# Patient Record
Sex: Male | Born: 1945 | Race: Black or African American | Hispanic: No | Marital: Married
Health system: Southern US, Community
[De-identification: ages and names within clinical notes are randomized; demographics above are authoritative.]

## PROBLEM LIST (undated history)

## (undated) DIAGNOSIS — I4892 Unspecified atrial flutter: Secondary | ICD-10-CM

## (undated) DIAGNOSIS — I272 Pulmonary hypertension, unspecified: Secondary | ICD-10-CM

## (undated) DIAGNOSIS — G4730 Sleep apnea, unspecified: Secondary | ICD-10-CM

## (undated) DIAGNOSIS — I2510 Atherosclerotic heart disease of native coronary artery without angina pectoris: Secondary | ICD-10-CM

## (undated) DIAGNOSIS — J449 Chronic obstructive pulmonary disease, unspecified: Secondary | ICD-10-CM

## (undated) DIAGNOSIS — E7800 Pure hypercholesterolemia, unspecified: Secondary | ICD-10-CM

## (undated) DIAGNOSIS — E119 Type 2 diabetes mellitus without complications: Secondary | ICD-10-CM

## (undated) DIAGNOSIS — I10 Essential (primary) hypertension: Secondary | ICD-10-CM

## (undated) DIAGNOSIS — Z87891 Personal history of nicotine dependence: Secondary | ICD-10-CM

## (undated) DIAGNOSIS — G8929 Other chronic pain: Secondary | ICD-10-CM

## (undated) DIAGNOSIS — I428 Other cardiomyopathies: Secondary | ICD-10-CM

## (undated) DIAGNOSIS — I5022 Chronic systolic (congestive) heart failure: Secondary | ICD-10-CM

## (undated) DIAGNOSIS — Z720 Tobacco use: Secondary | ICD-10-CM

## (undated) DIAGNOSIS — I480 Paroxysmal atrial fibrillation: Secondary | ICD-10-CM

## (undated) HISTORY — DX: Tobacco use: Z72.0

## (undated) HISTORY — DX: Chronic systolic (congestive) heart failure: I50.22

## (undated) HISTORY — DX: Other cardiomyopathies: I42.8

## (undated) HISTORY — DX: Unspecified atrial flutter: I48.92

## (undated) HISTORY — DX: Atherosclerotic heart disease of native coronary artery without angina pectoris: I25.10

## (undated) HISTORY — DX: Paroxysmal atrial fibrillation: I48.0

## (undated) HISTORY — DX: Chronic obstructive pulmonary disease, unspecified: J44.9

## (undated) HISTORY — DX: Pulmonary hypertension, unspecified: I27.20

## (undated) HISTORY — PX: SPLENECTOMY: SUR1306

## (undated) HISTORY — DX: Other chronic pain: G89.29

---
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
## 2002-04-10 ENCOUNTER — Encounter: Payer: Self-pay | Admitting: Emergency Medicine

## 2002-04-10 ENCOUNTER — Emergency Department (HOSPITAL_COMMUNITY): Admission: EM | Admit: 2002-04-10 | Discharge: 2002-04-10 | Payer: Self-pay | Admitting: Emergency Medicine

## 2002-12-02 ENCOUNTER — Encounter: Payer: Self-pay | Admitting: Physical Medicine and Rehabilitation

## 2002-12-02 ENCOUNTER — Encounter
Admission: RE | Admit: 2002-12-02 | Discharge: 2002-12-02 | Payer: Self-pay | Admitting: Physical Medicine and Rehabilitation

## 2003-06-09 ENCOUNTER — Emergency Department (HOSPITAL_COMMUNITY): Admission: EM | Admit: 2003-06-09 | Discharge: 2003-06-09 | Payer: Self-pay | Admitting: *Deleted

## 2003-06-09 ENCOUNTER — Encounter: Payer: Self-pay | Admitting: *Deleted

## 2003-10-04 ENCOUNTER — Inpatient Hospital Stay (HOSPITAL_COMMUNITY): Admission: RE | Admit: 2003-10-04 | Discharge: 2003-10-09 | Payer: Self-pay | Admitting: Specialist

## 2004-07-24 ENCOUNTER — Emergency Department (HOSPITAL_COMMUNITY): Admission: EM | Admit: 2004-07-24 | Discharge: 2004-07-25 | Payer: Self-pay | Admitting: Emergency Medicine

## 2005-03-27 ENCOUNTER — Ambulatory Visit (HOSPITAL_COMMUNITY): Admission: RE | Admit: 2005-03-27 | Discharge: 2005-03-27 | Payer: Self-pay | Admitting: Pulmonary Disease

## 2005-12-27 IMAGING — CR DG CHEST 2V
2 series · 2 of 2 positions shown · non-contrast
Comparison: none

CLINICAL DATA: Cough.  Chest pain.  
TWO VIEW CHEST:
Focal airspace disease in the posterior basilar lower lung noted.  Mild peribronchial thickening.  Cardiomediastinal silhouette unremarkable.  Remainder of lungs are clear.  Thoracic spondylosis is present.
IMPRESSION 
Mild airspace disease in the posterior lower lung, recommend follow-up to resolution.
Mild peribronchial thickening.

[view not recorded (1 of 2)]
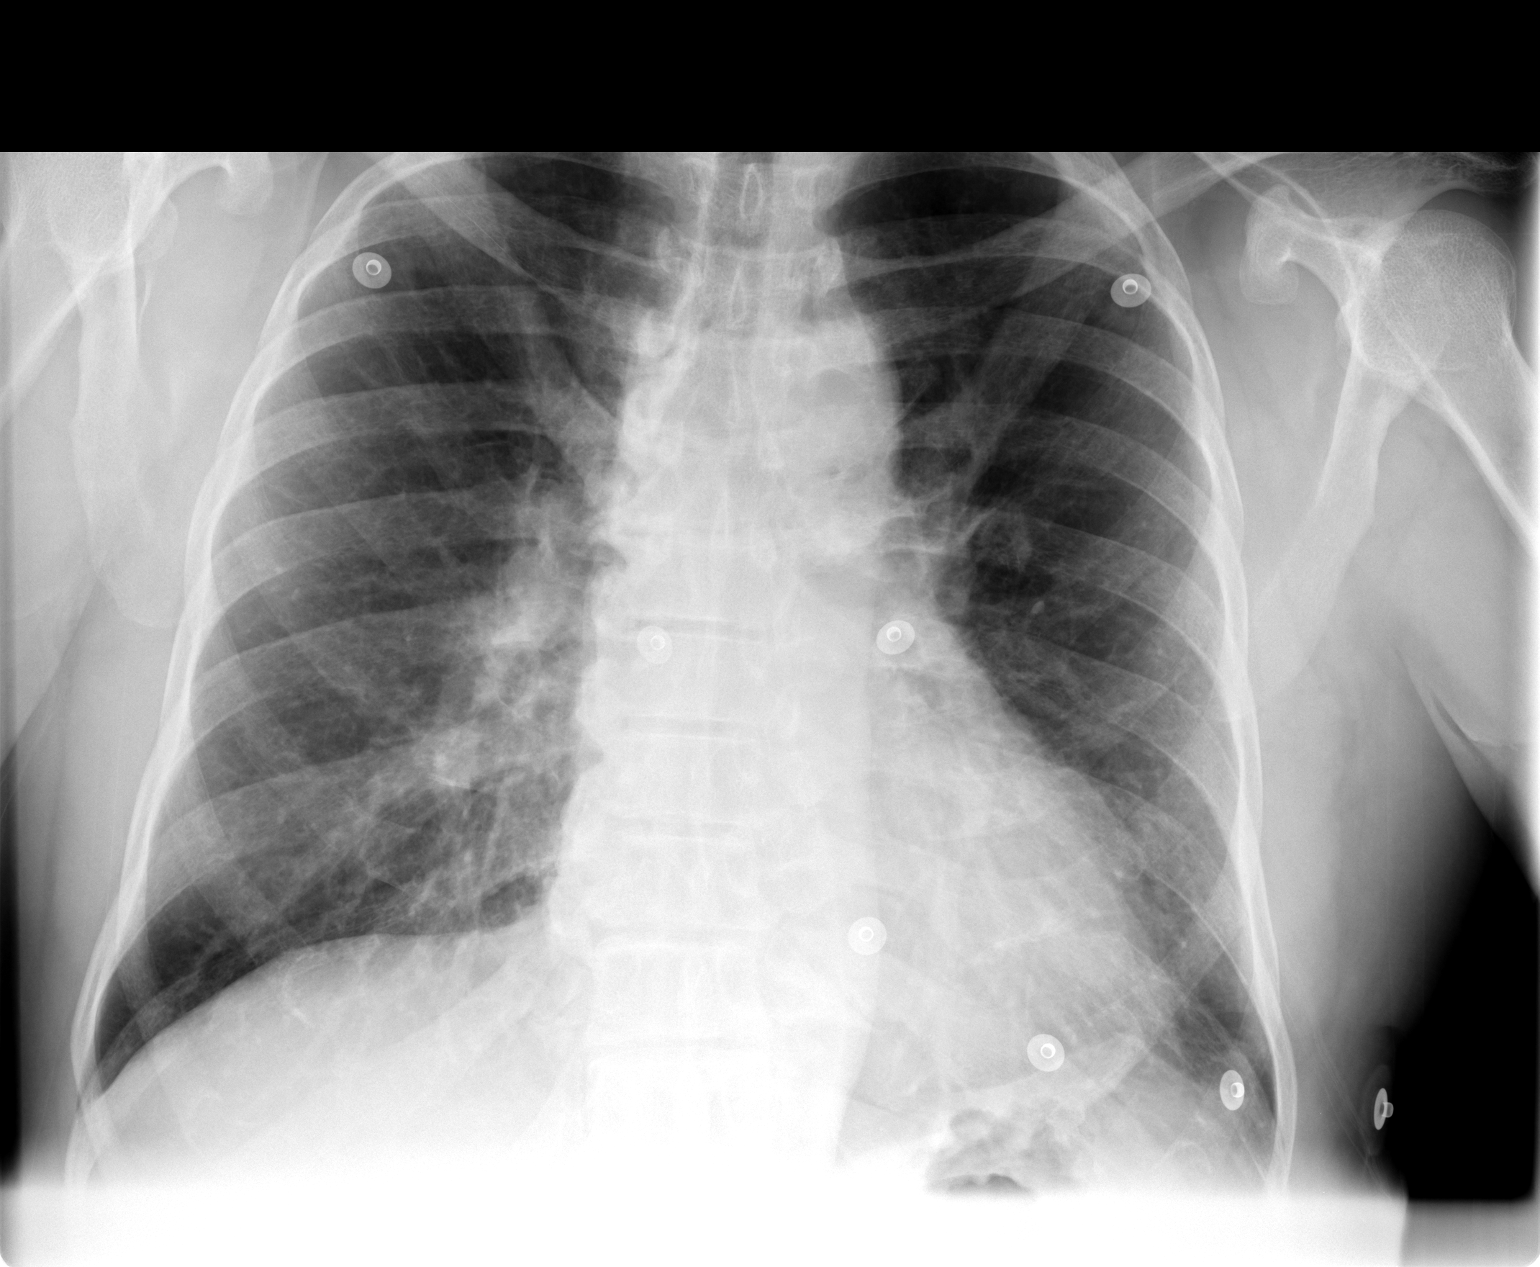

[view not recorded (2 of 2)]
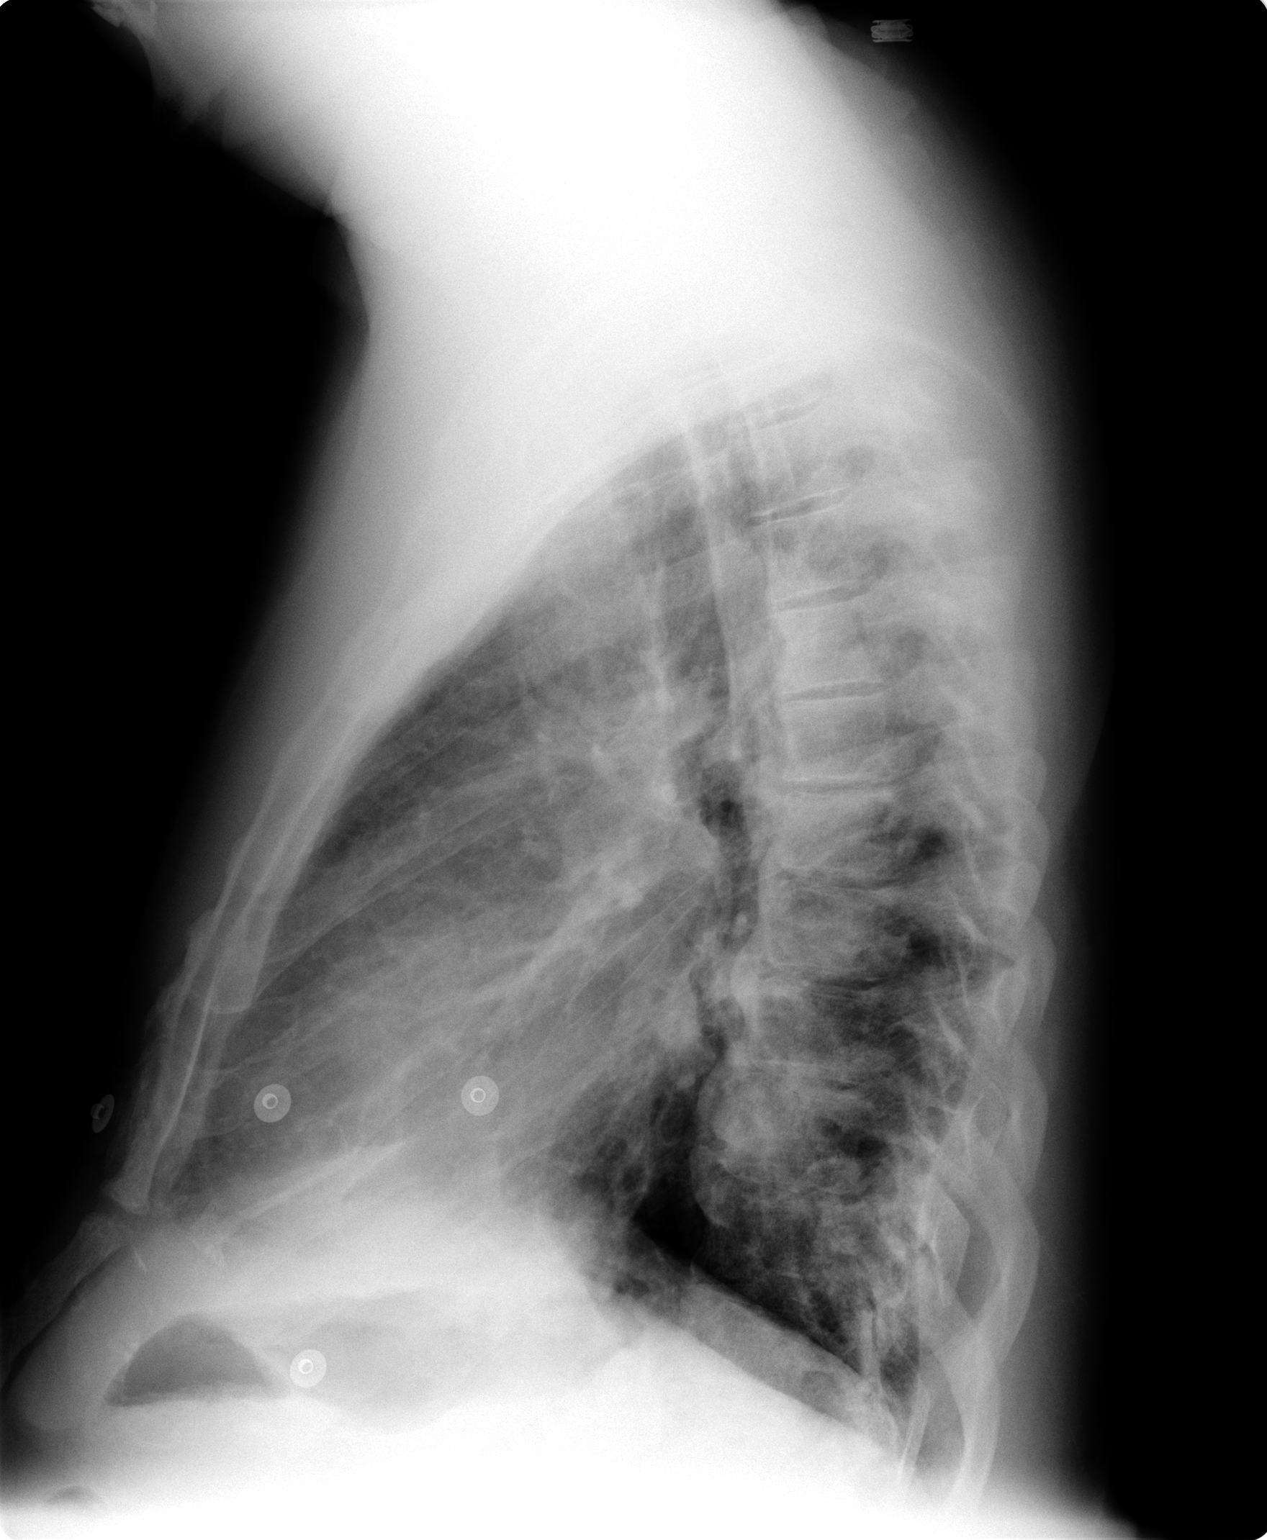

[2 of 2 positions shown; findings below may reference images not displayed]

## 2006-08-30 IMAGING — CR DG CHEST 2V
2 series · 2 of 2 positions shown · non-contrast
Comparison: none

CLINICAL DATA: Fever.  Cough.
 2-VIEWS OF THE CHEST ? 03/27/2005 ? ([DATE] HOURS):

[w chest pa]
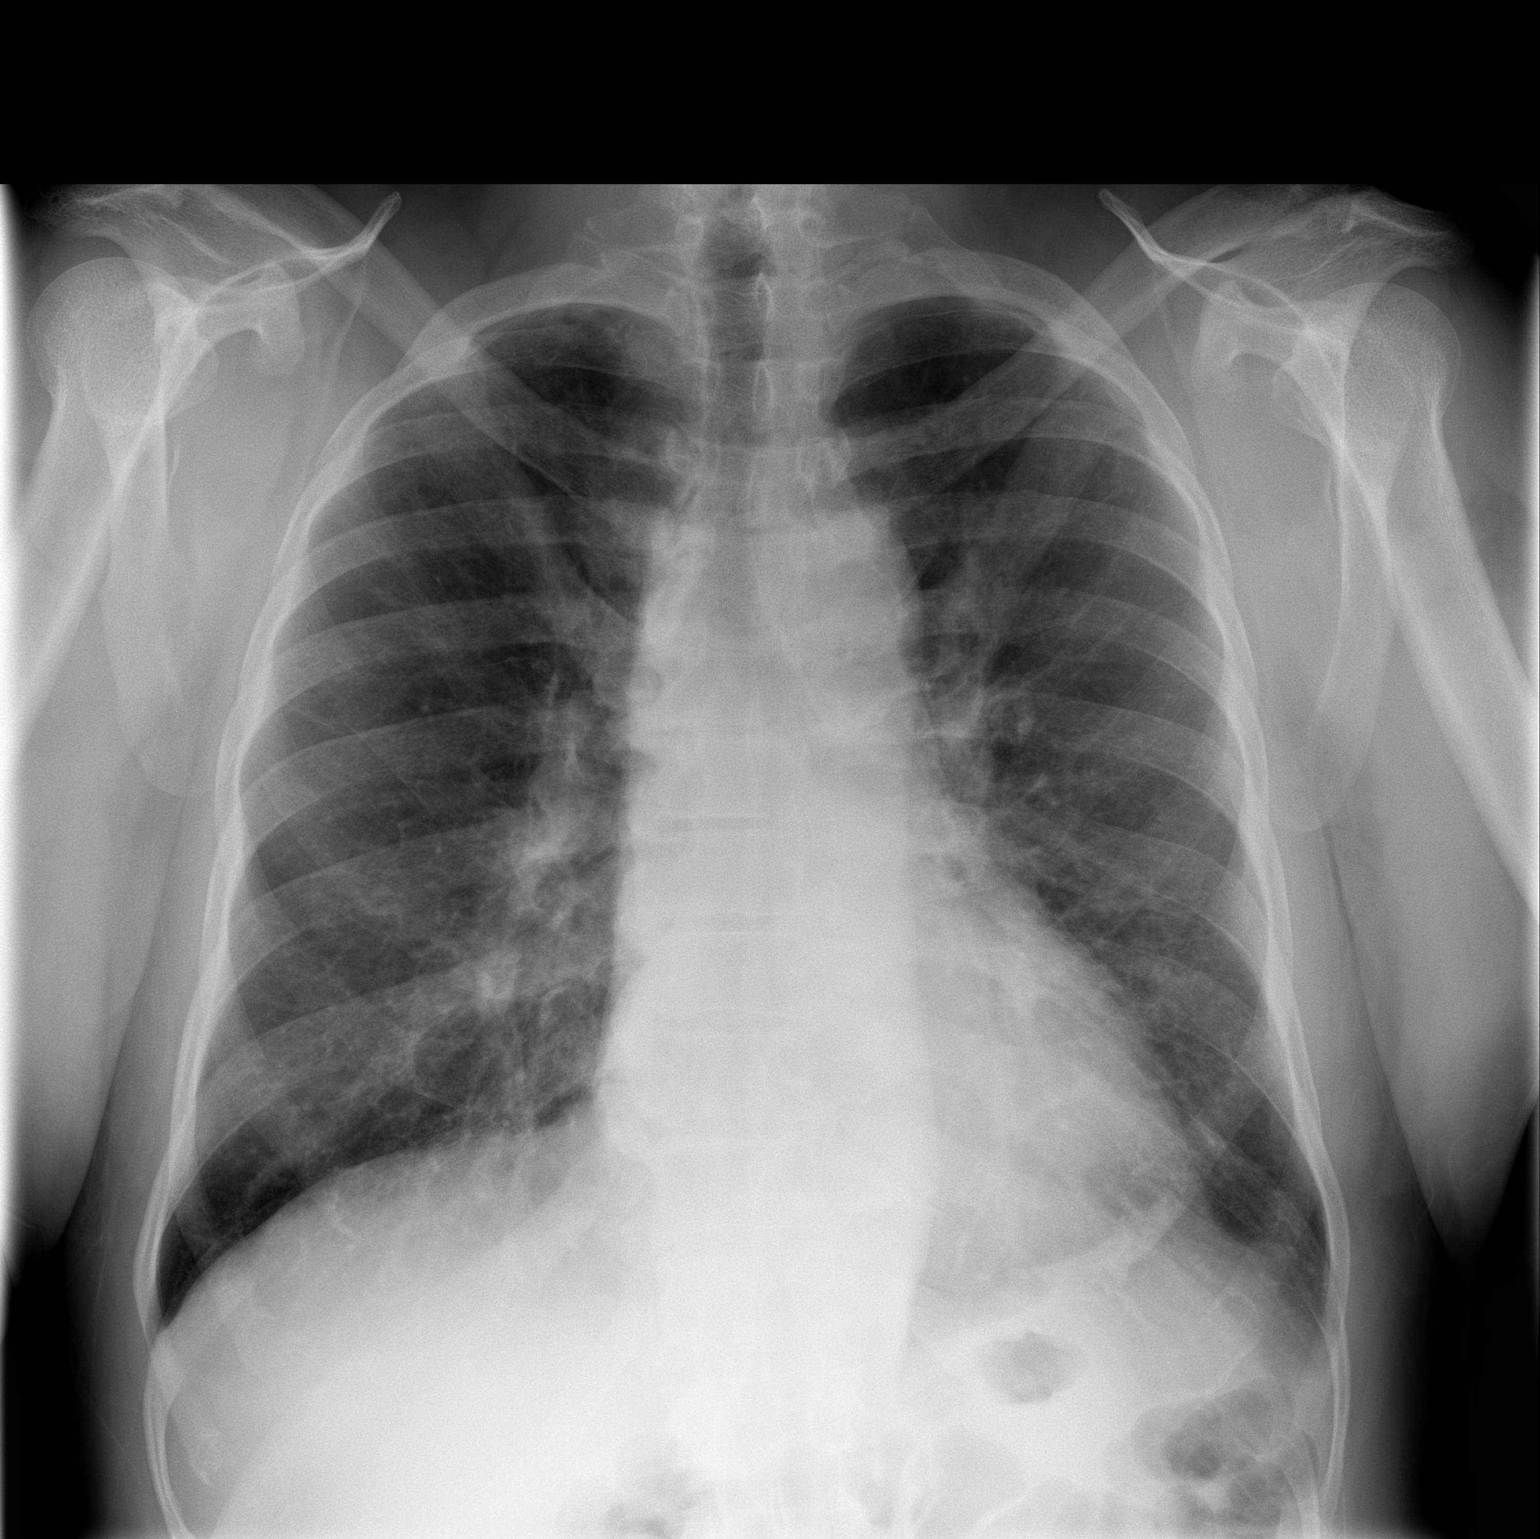

[w chest lat]
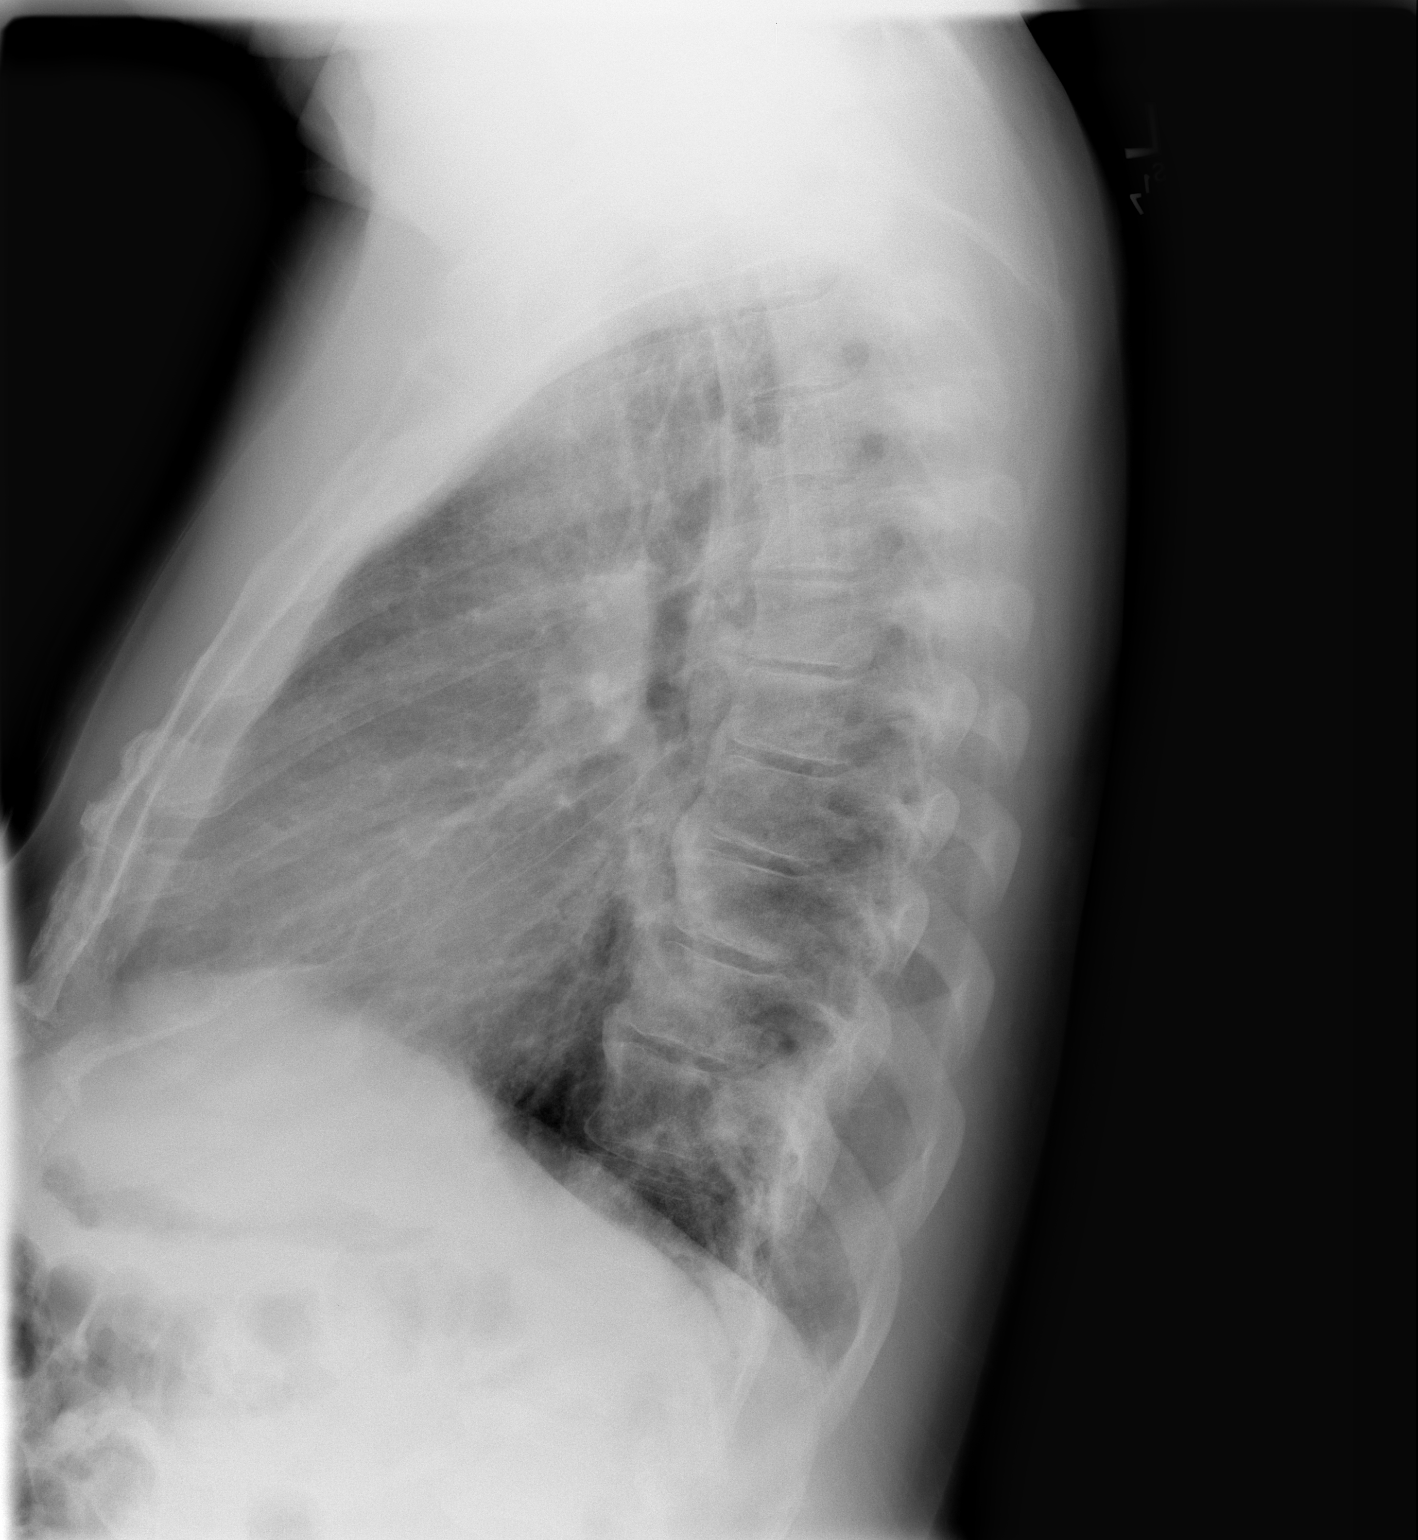

[2 of 2 positions shown; findings below may reference images not displayed]

FINDINGS: Streaky bibasilar infiltrates are present.  The upper lung zones are clear.  No pneumothoraces or effusions are seen.  The heart is normal in size.
IMPRESSION: Bibasilar streaky infiltrates.  Consider interstitial pneumonia.

## 2008-12-07 ENCOUNTER — Ambulatory Visit (HOSPITAL_COMMUNITY): Admission: RE | Admit: 2008-12-07 | Discharge: 2008-12-07 | Payer: Self-pay | Admitting: Pulmonary Disease

## 2010-05-12 IMAGING — US US ABDOMEN COMPLETE
1 series · 2 of 2 positions shown · non-contrast
Comparison: None

CLINICAL DATA: Abnormal LFTs.

ABDOMEN ULTRASOUND
TECHNIQUE: Complete abdominal ultrasound examination was performed
including evaluation of the liver, gallbladder, bile ducts,
pancreas, kidneys, spleen, IVC, and abdominal aorta.

[Series 1: unknown · 0.26mm/px · 2 of 2 slices shown]
[im 1/2]
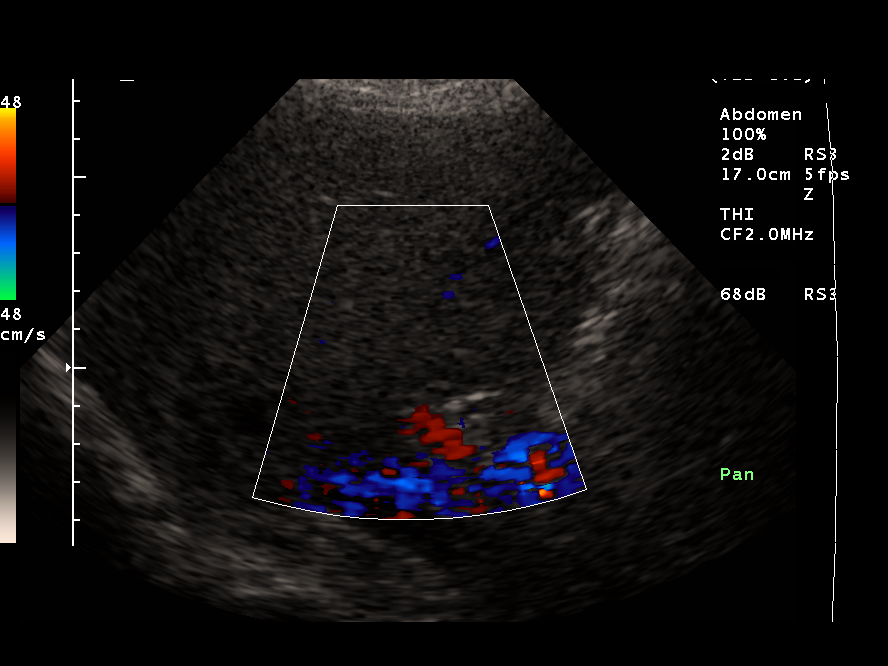
[im 2/2]
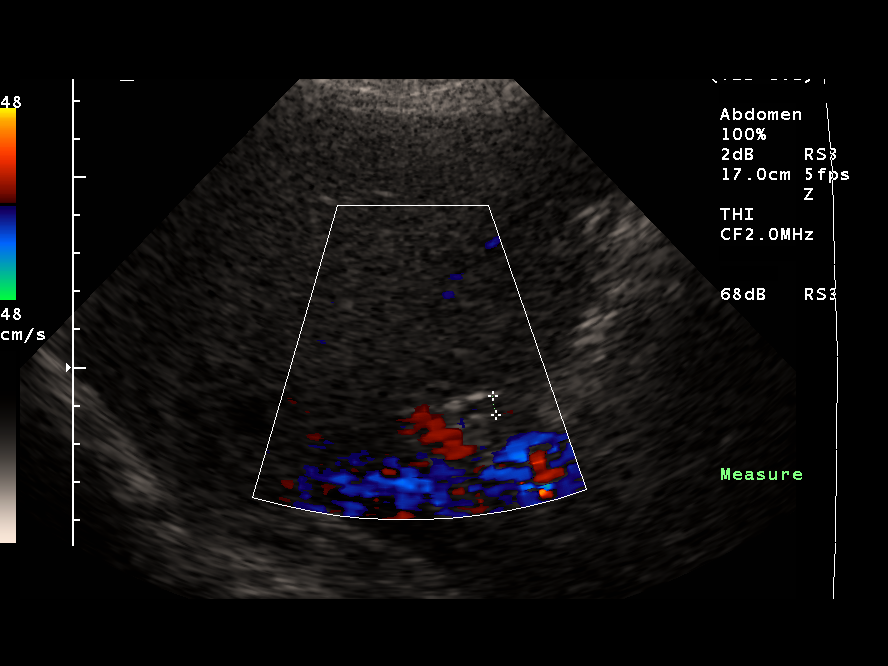

[2 of 2 positions shown; findings below may reference images not displayed]

FINDINGS: The gallbladder is unremarkable - there is no evidence of
cholelithiasis or cholecystitis.
Increased echogenicity of the liver is compatible with diffuse
fatty infiltration.  No focal hepatic abnormalities are noted.
There is no evidence of biliary dilatation and the CBD measures 5
mm in greatest diameter.
The IVC, kidneys, and abdominal aorta are unremarkable.
The spleen is not visualized compatible with history of
splenectomy.
A 3.2 x 2.5 cm cyst in the splenectomy bed is identified and may be
postoperative.
There is no evidence of free fluid.
The pancreas is not well visualized secondary to overlying bowel
gas.
IMPRESSION: No evidence of acute abnormality.

Fatty infiltration of the liver.

Spleen not visualized compatible with history of splenectomy.

3.2 x 2.5 cm cyst in the splenectomy bed - probably postoperative.

Pancreas not well visualized.

## 2010-05-12 IMAGING — US US ABDOMEN COMPLETE
1 series · 14 of 25 positions shown · non-contrast
Comparison: None

CLINICAL DATA: Abnormal LFTs.

ABDOMEN ULTRASOUND
TECHNIQUE: Complete abdominal ultrasound examination was performed
including evaluation of the liver, gallbladder, bile ducts,
pancreas, kidneys, spleen, IVC, and abdominal aorta.

[Series 1: unknown · 0.33mm/px · 14 of 60 slices shown]
[im 1/60]
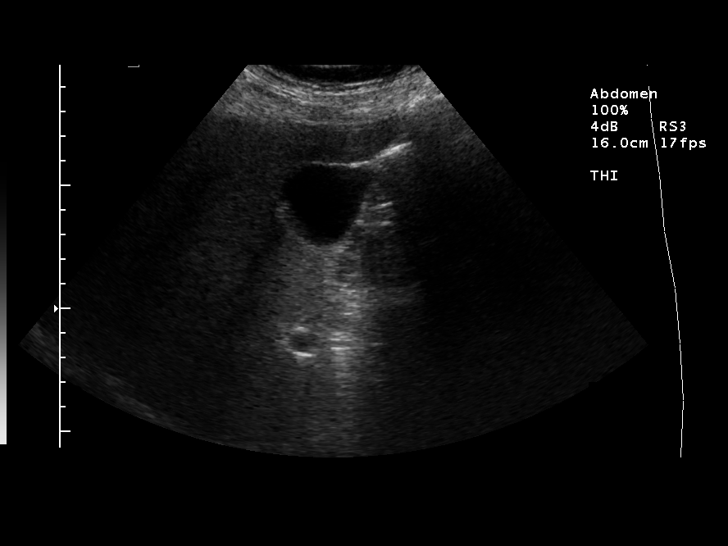
[im 5/60]
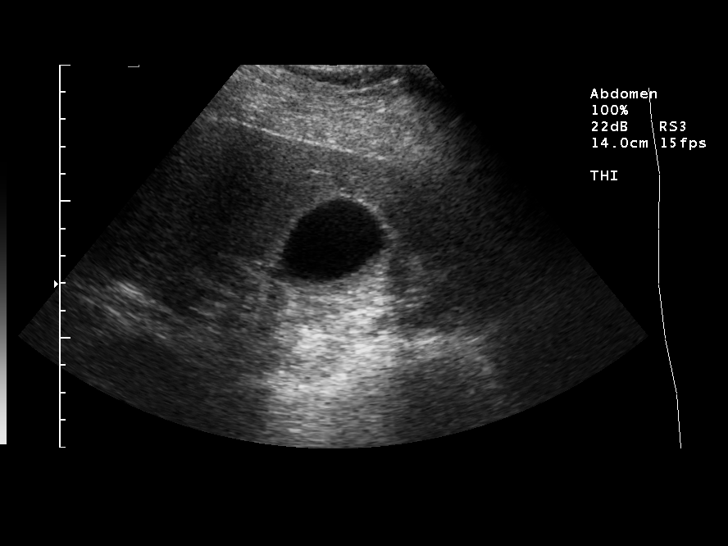
[im 10/60]
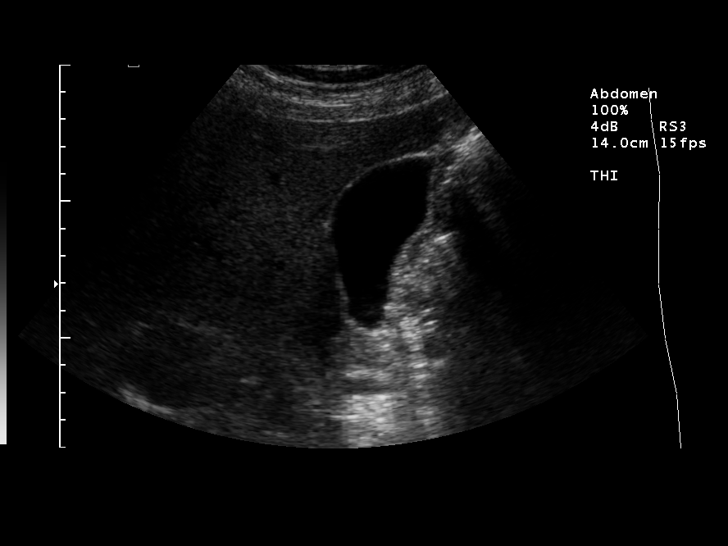
[im 15/60]
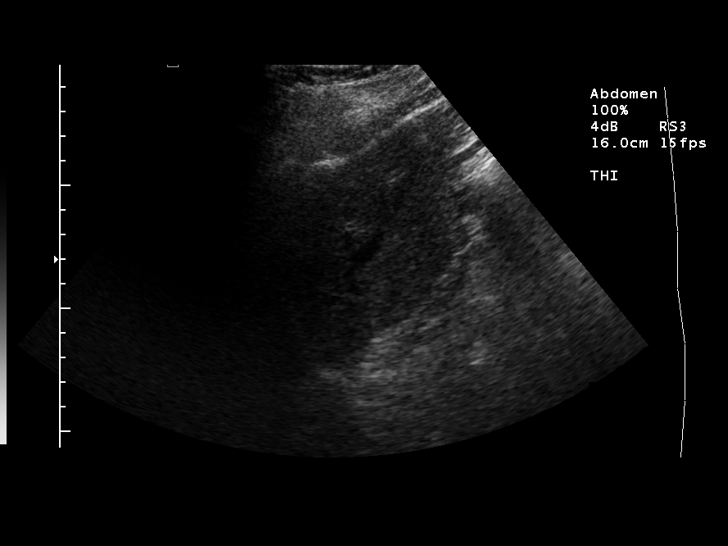
[im 20/60]
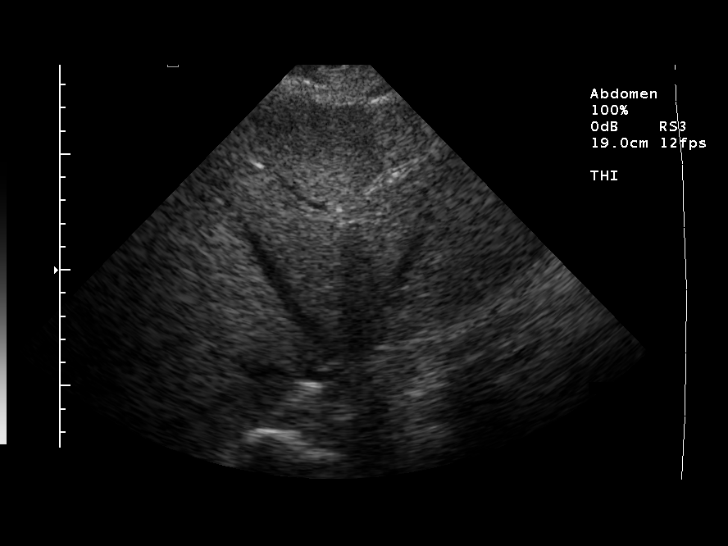
[im 23/60]
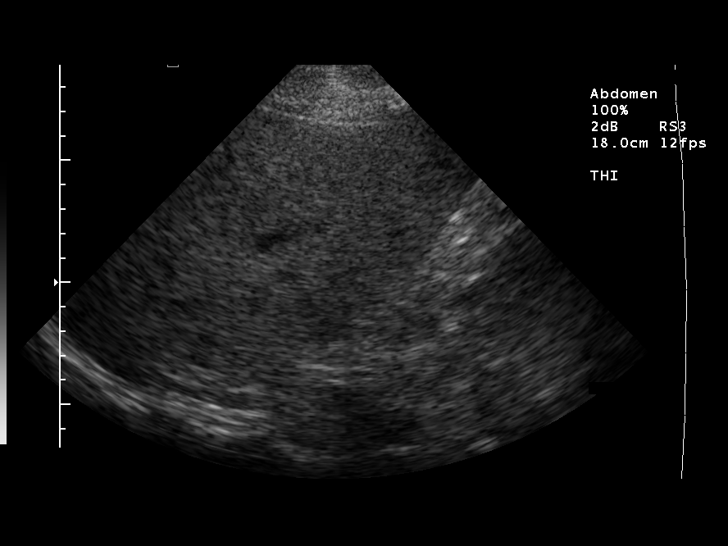
[im 28/60]
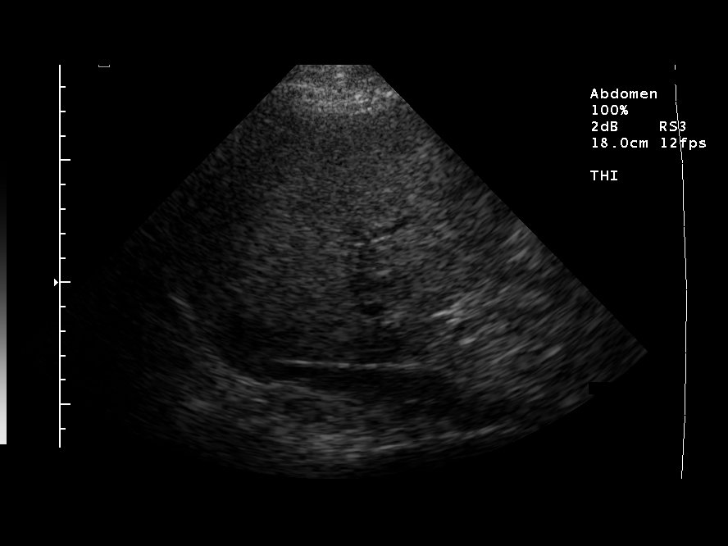
[im 32/60]
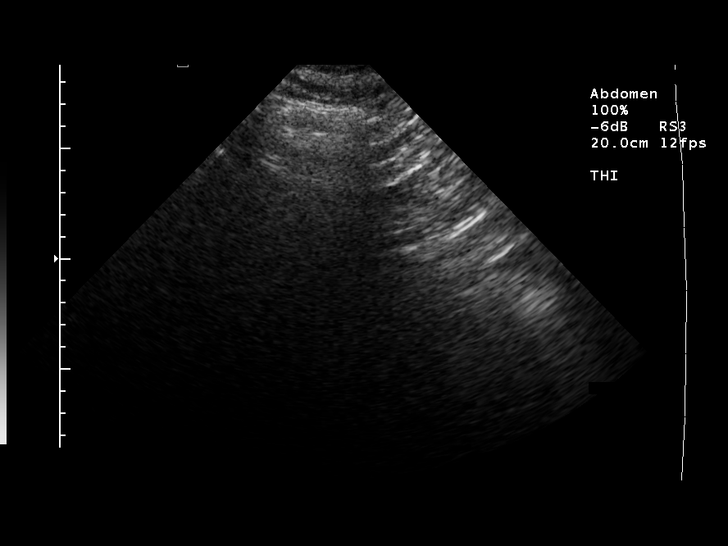
[im 37/60]
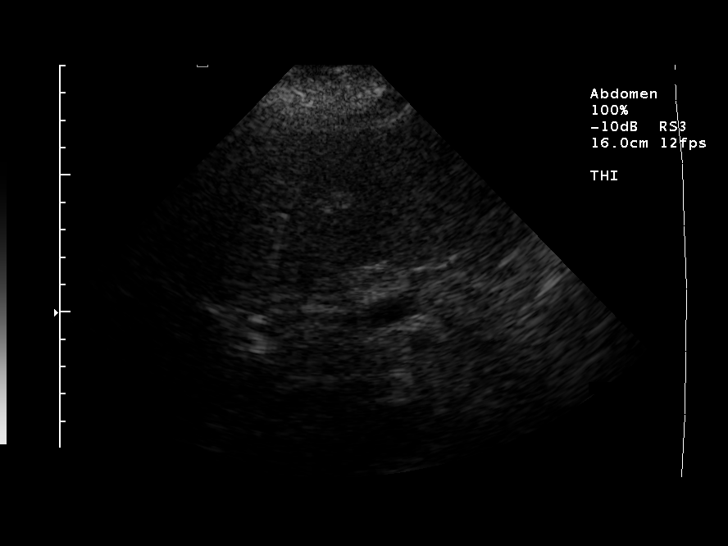
[im 40/60]
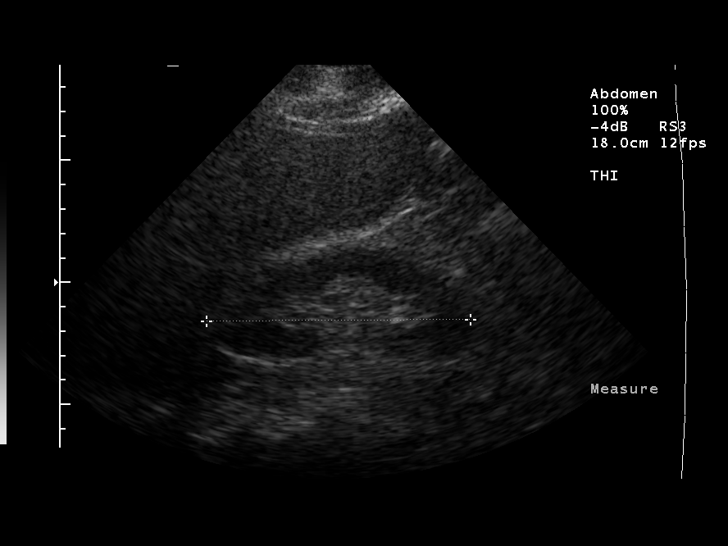
[im 45/60]
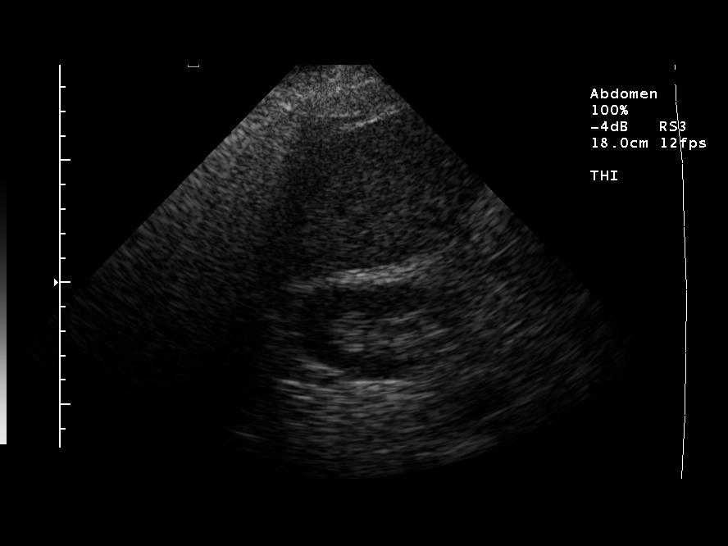
[im 50/60]
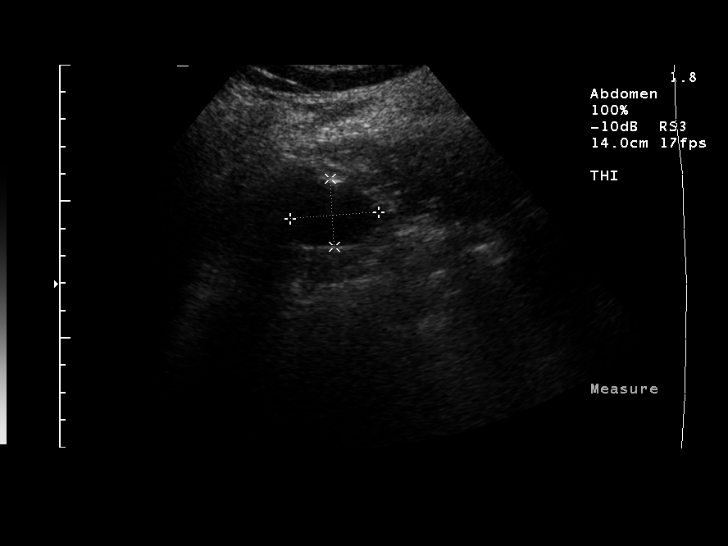
[im 55/60]
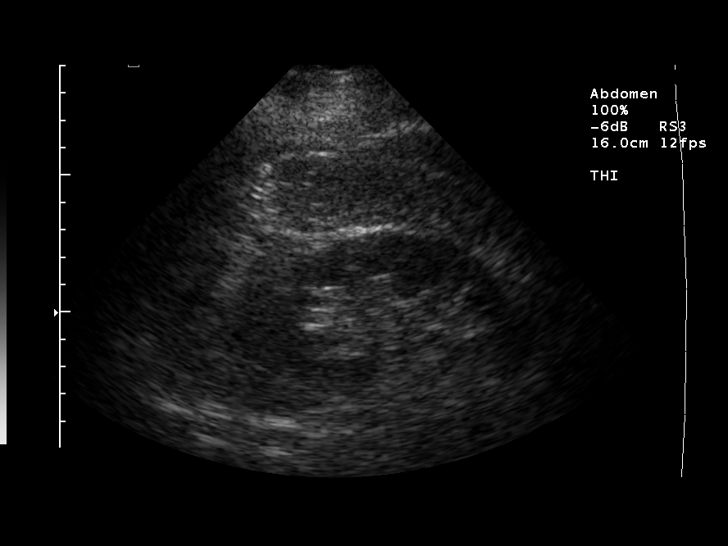
[im 60/60]
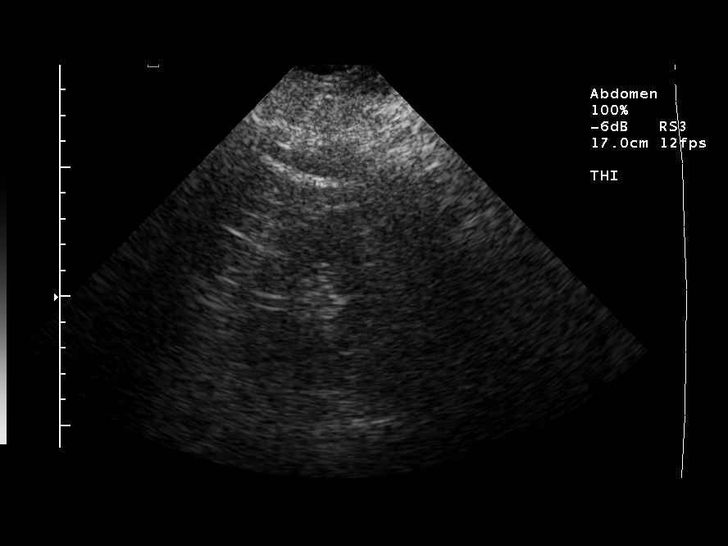

[14 of 25 positions shown; findings below may reference images not displayed]

FINDINGS: The gallbladder is unremarkable - there is no evidence of
cholelithiasis or cholecystitis.
Increased echogenicity of the liver is compatible with diffuse
fatty infiltration.  No focal hepatic abnormalities are noted.
There is no evidence of biliary dilatation and the CBD measures 5
mm in greatest diameter.
The IVC, kidneys, and abdominal aorta are unremarkable.
The spleen is not visualized compatible with history of
splenectomy.
A 3.2 x 2.5 cm cyst in the splenectomy bed is identified and may be
postoperative.
There is no evidence of free fluid.
The pancreas is not well visualized secondary to overlying bowel
gas.
IMPRESSION: No evidence of acute abnormality.

Fatty infiltration of the liver.

Spleen not visualized compatible with history of splenectomy.

3.2 x 2.5 cm cyst in the splenectomy bed - probably postoperative.

Pancreas not well visualized.

## 2010-11-24 ENCOUNTER — Encounter: Payer: Self-pay | Admitting: Pulmonary Disease

## 2011-03-21 NOTE — Discharge Summary (Signed)
NAME:  Nicholas Caldwell, Nicholas Caldwell NO.:  [id number]   MEDICAL RECORD NO.:  [id number]                   PATIENT TYPE:  INP   LOCATION:  0737                                 FACILITY:  [facility]   PHYSICIAN:  Susa Day, M.D.                 DATE OF BIRTH:  19-Jul-1946   DATE OF ADMISSION:  10/04/2003  DATE OF DISCHARGE:  10/09/2003                                 DISCHARGE SUMMARY   ADMISSION DIAGNOSES:  1. Avascular necrosis, left hip.  2. Hypercholesterolemia.  3. Hypertension.   DISCHARGE DIAGNOSES:  1. Avascular necrosis, left hip, status post left total hip arthroplasty.  2. Hypertension.  3. Hypercholesterolemia, resolved.  4. Anemia.  5. A fever of unknown origin.  6. Chronic back and shoulder pain.   PROCEDURES:  The patient was taken to the OR on December 1, to undergo a  left total hip arthroplasty.  Surgeon:  Dr. Susa Day.  Assistant:  Dr.  Shellia Carwin.  Anesthesia:  General.   CONSULTS:  1. PT/OT.  2. Hospitalist.   HISTORY:  Nicholas Caldwell is a 65 year old gentleman with a longstanding history  of left hip pain, initially evaluated and treated by Dr. Tonita Cong for  degenerative disk disease of the lumbar spine.  An MRI of the hip was then  obtained which showed advanced avascular necrosis involving the left femoral  head.  There was foci of AVN in the right hip as well.  Due to the patient's  history and longevity of his pain and MRI results, he was admitted for total  hip arthroplasty.  The risks and benefits of the surgery were discussed with  the patient, who ultimately wished to proceed with the procedure.   LABORATORY VALUES:  Preoperative labs showed white blood cell count of 8.1,  hemoglobin 11.7, hematocrit 35.8.  routine CBCs were followed throughout the  hospital course.  The patient did have a drop in his hemoglobin to a level  of 7.9, hematocrit 24.0.  He was transfused.  At time of discharge, had  resolved to _________ hemoglobin  9.3, hematocrit 27.5.  The patient did have  a slight rise in his white blood cell count during hospitalization to a  level of 12.2; however, at time of discharge, this had resolved to 10.4.  Coagulation studies done preoperatively showed a PT of 12.2, INR 0.9, PTT of  5.2.  At time of discharge, the patient was therapeutic with a PT of 20.0,  INR 2.2.  Routine chemistries done preoperatively showed sodium 138,  potassium 3.6, elevated glucose 137.  Routine chemistries were followed  throughout the hospital course.  The patient did have a slight drop in his  sodium to a level of 131; however, at the time of discharge, normalized to  137, potassium 4.5, glucose 101, with normal BUN and creatinine.  Routine  liver functions show an AST of 18, ALT 13, ALP  133, total bilirubin 0.4.  ALP had normalized at time of discharge to a level of 104.  Hemoglobin A1c  was done during hospitalization __________ 6.2.  Lipid studies were also  obtained which show a total cholesterol of 157, triglycerides 83, HDL 56,  LDL 84.  Urinalysis done preoperatively shows normal urine.  Repeat  urinalysis was done during hospitalization showed slight increase in protein  to a level of 30, otherwise normal.  The patient is B positive.  Urine  culture came back no growth.  Preoperative EKG shows a normal sinus rhythm  with incomplete right bundle-branch block.  A chest x-ray done  preoperatively shows cardiomegaly and chronic lung disease.  Preoperative  hip x-ray shows dysarthria and necrosis and degenerative changes of the left  hip joint.  Postoperative films showed an excellent placement of the  prosthesis.  Left shoulder films done during the hospitalization show  degenerative changes in the left acromioclavicular joint.  Portable chest x-  ray done during the hospitalization did show chronic atelectasis with  scarring in the left base.  That is unchanged from a previous film.   HOSPITAL COURSE:  The patient was  admitted for the above-stated procedure.  He underwent this without difficulty then was transferred to the PACU, then  to the orthopedic floor for continued postoperative care.  The patient was  placed on PC analgesics for pain relief and Coumadin for DVT prophylaxis.  Main issue during hospitalization was pain control issues as well as fever.  Medicine was consulted to follow with [place] in regard to hypertension issues as  well as fever.  The patient also developed a slight postoperative anemia  with a hemoglobin level of 8.8; however, he remained asymptomatic.  His main  complaint was left shoulder pain.  An x-ray of the shoulder was obtained  which showed degenerative changes of the [facility] joint, and we discussed the  possibility of future cortisone injection.  Coumadin was continued through  the pharmacy for DVT prophylaxis.  Incentive spirometer was encouraged.  The  patient was encouraged to get out of bed with PT/OT.  PCA was weaned.  Foley  was discontinued.  Discharge planning was initiated.  Hospitalists continued  to follow along and manage the patient's medical issues.  The patient did  receive a transfusion of two units packed red blood cells on postoperative  day #3 due to a drop in hemoglobin to a level of 7.9.  A urinalysis and  chest x-ray was ordered due to the persistent fever, but these came back  with no abnormality.  Treatment was initiated by the hospitalist; however,  the patient declined treatment.  In regard to the hip, he was advancing  well.  Home health was arranged for continued therapy.  Throughout the  hospital course the patient remained with a low-grade temperature of 100-  101; however, no source of infection could be initiated.  The incision  showed no evidence of infection.  Urinalysis, as noted, with chest x-ray was  negative.  The patient's hemoglobin stabilized to a level of 9.3, 27.5 with therapeutic INR at 2.2.  At this point, the patient was requesting to  be  discharged home.  From an orthopedic standpoint, he was stable.  This was  discussed with the hospitalists.  They also felt the patient was stable to  be discharged home, with follow-up care with his medical physician.   DISPOSITION:  The patient was discharged home with home health arrangements  made as well  as Coumadin monitoring.  He is to follow up with Dr. Tonita Cong in  approximately 10-14 days for reevaluation and x-ray.   DISCHARGE MEDICATIONS:  1. Percocet 7.5/325, 1-2 p.o. q.4-6h. p.r.n. pain.  2. Robaxin 500 mg 1 p.o. q.6-8h.  3. Coumadin per pharmacy.  4. Trinsicon 1 p.o. t.i.d.  5. Avelox 400 mg 1 p.o. daily x 7 days.   ACTIVITY:  Orders as given by PT/OT with total hip precautions.  He is to  change his dressing daily.  It is okay to shower.  Incentive spirometer was  encountered for continued fever.  The patient will follow up with Dr. Tonita Cong  as well as Dr. Katherine Roan for his medical issues.   DIET:  As tolerated.   CONDITION ON DISCHARGE:  Stable.     Rometta Emery, P.A.                   Susa Day, M.D.    CS/MEDQ  D:  11/15/2003  T:  11/15/2003  Job:  [id number]

## 2011-03-21 NOTE — Consult Note (Signed)
NAME:  Nicholas Caldwell, Nicholas Caldwell NO.:  [id number]   MEDICAL RECORD NO.:  [id number]                   PATIENT TYPE:  INP   LOCATION:  1937                                 FACILITY:  [facility]   PHYSICIAN:  Ezequiel Kayser, M.D.             DATE OF BIRTH:  24-Sep-1946   DATE OF CONSULTATION:  10/05/2003  DATE OF DISCHARGE:                                   CONSULTATION   REQUESTING PHYSICIAN:  Susa Day, M.D.   REASON FOR CONSULTATION:  1. Hyperglycemia.  2. Hypertension.   HISTORY OF PRESENT ILLNESS:  A 65 year old African American male with a  longstanding history of left hip pain, status post left total hip  arthroplasty secondary to avascular necrosis.  Today he is postoperative day  #1.  He says that since surgery his left shoulder has a constant ache not  responsive to the narcotic pain medications that he is currently getting.  However, the  narcotics greatly helped with the knee pain.  He is also  having some indigestion and request Maalox.  Otherwise he feels well.   REVIEW OF SYSTEMS:  CONSTITUTIONAL:  No fever or chills.  No night sweats.  EYES:  No blurry vision.  No diplopia.  ENT:  No pain and no tinnitus.  RESPIRATORY:  No cough, shortness of breath, or chest pain.  CARDIOVASCULAR:  No palpitations, edema, or orthopnea.  GASTROINTESTINAL:  No abdominal pain,  pain, nausea, or vomiting.  GENITOURINARY:  No dysuria or hematuria.  MUSCULOSKELETAL:  As in HPI.  Otherwise no weakness or stiffness.  SKIN:  No  rash, pruritus, or purpura.  NEUROLOGIC:  No weakness, numbness, or  tingling.   PAST MEDICAL HISTORY:  Significant for hypertension and hyperlipidemia.   SOCIAL HISTORY:  He is married.  He currently smokes half of a packs of  cigarettes per day.  Denies alcohol use.   FAMILY HISTORY:  Diabetes mellitus and coronary artery disease.   ALLERGIES:  No known drug allergies.   CURRENT MEDICATIONS:  1. Colace 100 mg b.i.d.  2. Trinsicon one  capsule t.i.d.  3. Coumadin.  4. Valium 10 mg b.i.d.  5. Dilaudid 10 mg q.4h. p.r.n.  6. Ancef 2 g q.8h.   PHYSICAL EXAMINATION:  VITAL SIGNS:  Temperature 100.8 degrees, pulse 111,  respiratory rate 18, blood pressure 153/76.  GENERAL APPEARANCE:  No acute distress.  Cooperative.  HEENT:  Eyes:  Pupils equal and sluggishly reactive to light.  Extraocular  movements intact.  ENT:  Oropharynx moist and clear.  NECK:  Supple.  No JVD or thyromegaly.  NODES:  No cervical lymphadenopathy.  LUNGS:  Clear to auscultation bilaterally.  No rales, rhonchi, or wheezes.  HEART:  Regular rate and rhythm.  Normal S1 and S2.  No murmurs, rubs, or  gallops.  ABDOMEN:  Soft, nontender, and nondistended.  Decreased bowel sounds.  EXTREMITIES:  Pulses +2.  No edema.  SKIN:  No rash, purpura, or petechiae.  MUSCULOSKELETAL:  Limited range of motion secondary to pain in left shoulder  and tender to touch.  Pain exacerbated with shoulder movement.  NEUROLOGIC:  Alert and oriented x 3.  No focal deficits.   LABORATORY DATA:  PT 13.8, INR 1.1.  Sodium 131, potassium 4.2, glucose 137,  BUN 10, creatinine 0.9.  Hemoglobin 9.3.   ASSESSMENT AND PLAN:  1. Hyperglycemia.  He has no history of diabetes mellitus or glucose     intolerance.  He is currently on D5 1/2 normal saline solution, which is     likely the cause of his hyperglycemia.  Will need to check a fasting     glucose when off of D5 to rule out diabetes mellitus.  2. Hypertension.  He has a history of hypertension and was on Monopril prior     to hospitalization.  Will restart.  3. Hyperlipidemia.  He was Zocor prior to hospitalization.  Will continue     the Zocor.  Check fasting lipid panel.  His LFTs were normal on September 27, 2003.  4. Left shoulder pain, question secondary to positioning in surgery.  He     gets relief with hot packs, but not with narcotics.  Will follow closely     at this point.  5. Indigestion.  Will give  Maalox.  6. Anemia, likely postoperative.  Follow closely.                                               Ezequiel Kayser, M.D.    GD/MEDQ  D:  10/05/2003  T:  10/05/2003  Job:  [id number]

## 2011-03-21 NOTE — Op Note (Signed)
NAME:  Nicholas Caldwell, Nicholas Caldwell NO.:  [id number]   MEDICAL RECORD NO.:  [id number]                   PATIENT TYPE:  INP   LOCATION:  8588                                 [facility]ACILITY:  [facility]   PHYSICIAN:  Susa Day, M.D.                 DATE OF BIRTH:  January 29, 1946   DATE OF PROCEDURE:  10/04/2003  DATE OF DISCHARGE:                                 OPERATIVE REPORT   PREOPERATIVE DIAGNOSIS:  Avascular necrosis, left hip.   POSTOPERATIVE DIAGNOSIS:  Avascular necrosis, left hip.   PROCEDURE PERFORMED:  Left total hip arthroplasty.   ANESTHESIA:  General.   COMPONENTS:  DePuy Prodigy 15 mm stem, 54 mm acetabular component, 36 mm +4  degree liner, +8.5 head, 32 mm head.   ANESTHESIA:  General.   ASSISTANT:  Tarri Glenn, M.D.   BRIEF HISTORY AND INDICATION:  A 65 year old with severe pain secondary to  avascular necrosis.  Operative intervention is indicated for replacement of  the degenerated joint.  He also had osteoarthritic changes of the  acetabulum.  Operative intervention is indicated for total hip replacement.  The risks and benefits were discussed, including bleeding, infection,  neurovascular injury, DVT, PE, suboptimal range of motion, dislocation, need  for revision, component wear, etc.   TECHNIQUE:  The patient was placed in the supine position.  After induction  of adequate general endotracheal anesthesia, 2 g of Kefzol, he was placed in  the right lateral decubitus position, all bony prominences were well-padded.  The right lower extremity was then flexed and well-padded.  He was secured  with the hip holder.  The left lower extremity and paratrochanteric region  was prepped and draped in the usual sterile fashion.  An incision was made  based upon the greater trochanter.  The subcutaneous tissue was dissected  and electrocautery utilized to achieve hemostasis.  The fascia lata  identified and divided along the line of the skin incision.   Retractors were  placed.  Adductor tenotomy performed.  The bursa was divided, external  rotators identified, tagged, and reflected posteriorly to protect the  sciatic nerve at all times.  A T-shaped capsulotomy was performed and then a  capsulectomy was performed as well.  Hypertrophic capsule was noted.  I then  dislocated the hip.  There was severe osteonecrosis of the femoral head  noted.  An oscillating saw utilized to perform osteotomy one fingerbreadth  above the femoral neck above the lesser trochanter.  Then utilizing the  initiator and the box chisel to lateralize entry  into the femoral canal, it  was then entered with a T-handled reamer and sequentially reamed from a 9 mm  to a 14.5 mm, which was preoperatively templated to a 15.  I evaluated the  inside of the canal with a curette, and it was found to be intact.  The  trial broach from a smallest  broach up to the 15, and one end could fully  seat the 15.  It was found that we had lateralized sufficiently; however,  there was a small bony island over the posterior medial calcar that was  preventing the full insertion of the femoral component.  This was after  meticulous evaluation.  Therefore, I utilized the high-speed bur to burr  this region and following that, then we were able to proceed with the trial  satisfactorily.  I then placed in a retractor to expose the acetabulum and  remove the labrum, curetted the remaining cartilage, and there was  significant cartilaginous wear.  I templated at 84 preoperatively;  therefore, we sequentially reamed to a 53.  I started at the 47, medialized  it into the space of the fovea.  Then it was sequentially reamed and  expanded to the rim fit with good bleeding subchondral bone.  A small cyst  was curetted into bone graft.  I then utilized multiple liners, and we had  excellent purchase noted.  We selected the Duraloc cup, three holes for  screws placed inferiorly, impacted into place.   Put a trial femoral  component in, reduced it, and felt that we had insufficient inclination of  the acetabulum and therefore changed it to represent more anteversion as  well as less of a valgus cut.  We positioned the acetabulum and then  impacted it into place with excellent purchase.  We seated the acetabulum  appropriately and put a center screw in that.  We initially used a 28 ball  and liner but felt that we would improve our motion with a 32 ball and the  liner, a 10 degree lip with a 4 mm offset.  This was found after multiple  trialling to the best fit and appropriate leg length.  We removed redundant  capsule which was impinging anteriorly.  We also removed some capsule  anteriorly off the femur.  Electrocautery utilized to achieve strict  hemostasis.  We had oozing throughout the case as blood pressure remained  relatively hypertensive.  It was controlled.  Next we took the 15 mm  permanent Prodigy component and it was impacted into the femoral canal in  the appropriate version.  This is after we placed a permanent liner and a  permanent prosthetic acetabular component, a 54, 10 degree lip, 4 degree  offset.  We rotated the lip posteriorly and superiorly.  We trialled  multiple heads and the +8.5 found to be optimal.  The appropriate reduction,  and excellent stability, flexion to 90 degrees, internal rotation to 35  degrees before dislocation.  Full adduction in 90 degrees of flexion and no  dislocation.  Full extension, extension in internal rotation with no  dislocation anteriorly.  Excellent stability and coverage was noted of the  acetabular component.  There were no fractures of the acetabulum or of the  femoral neck.  The wound was copiously irrigated once again.  We had to  utilize the calcar reamer for the femoral component prior to final insertion  of the component.  We used a copious amount of irrigation prior to its insertion.  Next again the full capsule had been  excised.  Next we repaired  the tenotomy and the adductor with #1 Vicryl interrupted figure-of-eight  suture.  We repaired the fascia with #1 Vicryl interrupted figure-of-eight  sutures.  There was no active bleeding noted.  The subcutaneous tissue was  reapproximated with 2-0 Vicryl simple sutures.  Just  prior to that we  measured his leg lengths and it was found to be satisfactory.  The  subcutaneous tissue reapproximated with 2-0 Vicryl simple sutures, and the  skin was reapproximated with staples.  The wound was dressed sterilely.  He  was placed supine on the hospital bed.  Leg lengths were checked and found  to be equivalent.  He then had an abduction pillow placed and transported to  recovery in satisfactory condition.   The patient tolerated the procedure well with no complications.  Blood loss  1000 mL.                                               Susa Day, M.D.    Geralynn Rile  D:  10/04/2003  T:  10/04/2003  Job:  [id number]

## 2011-03-21 NOTE — H&P (Signed)
NAME:  Nicholas Caldwell, Nicholas Caldwell NO.:  [id number]   MEDICAL RECORD NO.:  [id number]                   PATIENT TYPE:  INP   LOCATION:  NA                                   FACILITY:  [facility]   PHYSICIAN:  Susa Day, M.D.                 DATE OF BIRTH:  1946/02/20   DATE OF ADMISSION:  10/04/2003  DATE OF DISCHARGE:                                HISTORY & PHYSICAL   CHIEF COMPLAINT:  Left hip pain.   HISTORY:  Nicholas Caldwell is a 65 year old gentleman with a longstanding history  of left hip pain.  He was initially evaluated by Dr. Tonita Cong.  He is also  under treatment for degenerative disk disease of the lumbar spine by Dr.  Nelva Bush.  MRI study of the hip was obtained which did show advanced avascular  necrosis involving the left femoral head as well as a teeny foci of AVN  involving the right hip as well.  Due to the patient's history and longevity  of his pain and results of his MRI, it is felt he would benefit from a left  total hip arthroplasty.  The risks and benefits of the surgery were  discussed with the patient, and he wishes to proceed.   PAST MEDICAL HISTORY:  Significant for:  1. Hypercholesterolemia.  2. Hypertension.   CURRENT MEDICATIONS:  1. Percocet 10/325 p.r.n. pain.  2. Celebrex 200 mg daily.  3. Zocor 10 mg, 1 p.o. daily.  4. Lipitor 10 mg, 1 p.o. daily.  5. Diazepam 10 mg, 1 p.o. b.i.d.  6. Uniphyl p.r.n. Uniphyl   PREVIOUS SURGERIES:  Splenectomy.   SOCIAL HISTORY:  The patient is married.  He smokes approximately one-half  pack of cigarettes per day.  No alcohol intake is noted.   FAMILY HISTORY:  Mother has diabetes and coronary artery disease.   REVIEW OF SYSTEMS:  GENERAL:  The patient denies fevers, chills, night  sweats, or bleeding tendencies.  CENTRAL NERVOUS SYSTEM:  No blurred or  double vision, seizure, headache, or paralysis.  RESPIRATORY:  No shortness  of breath, productive cough, or hemoptysis.  CARDIOVASCULAR:  No  chest pain,  angina, or orthopnea.  GENITOURINARY:  No dysuria, hematuria, or discharge.  GASTROINTESTINAL:  No nausea, vomiting, diarrhea, constipation, or bloody  stools.  MUSCULOSKELETAL:  As pertinent in HPI.   PHYSICAL EXAMINATION:  VITAL SIGNS:  Pulse is 100, respiratory rate 16, BP  156/94.  GENERAL:  This is a well-developed, well-nourished 65 year old gentleman in  mild distress.  He does walk with an antalgic gait using a cane.  HEENT:  Atraumatic, normocephalic.  Pupils are equal, round, and reactive to  light.  EOMs intact.  NECK:  Supple.  No lymphadenopathy.  CHEST:  The patient does have right lower lobe wheezes.  Otherwise clear to  auscultation.  BREASTS, GENITOURINARY:  Not examined.  Not pertinent to HPI.  HEART:  Regular rate and rhythm.  Without murmurs, gallops, or rubs.  ABDOMEN:  Soft, nontender, nondistended.  Bowel sounds x4.  SKIN:  No rashes or lesions are noted.  EXTREMITIES:  The patient has painful range of motion of the left hip with  pain that comes into the left thigh.   LABORATORY DATA:  MRI did show AVN of the left hip.   IMPRESSION:  1. Avascular necrosis left hip.  2. Hypercholesterolemia.  3. Hypertension.   PLAN:  The patient will be admitted to [facility] to undergo a  left total hip arthroplasty by Dr. Susa Day.     Rometta Emery, P.A.                   Susa Day, M.D.    CS/MEDQ  D:  09/26/2003  T:  09/26/2003  Job:  [id number]

## 2011-05-05 ENCOUNTER — Emergency Department (HOSPITAL_COMMUNITY)
Admission: EM | Admit: 2011-05-05 | Discharge: 2011-05-06 | Discharge status: Against Medical Advice | Payer: Self-pay | Attending: Emergency Medicine | Admitting: Emergency Medicine

## 2011-05-05 DIAGNOSIS — Z0389 Encounter for observation for other suspected diseases and conditions ruled out: Secondary | ICD-10-CM | POA: Insufficient documentation

## 2013-08-01 ENCOUNTER — Other Ambulatory Visit (HOSPITAL_COMMUNITY): Payer: Self-pay | Admitting: Pulmonary Disease

## 2013-08-01 DIAGNOSIS — R1010 Upper abdominal pain, unspecified: Secondary | ICD-10-CM

## 2013-08-04 ENCOUNTER — Ambulatory Visit (HOSPITAL_COMMUNITY): Admission: RE | Admit: 2013-08-04 | Payer: Medicare Other | Source: Ambulatory Visit

## 2013-11-24 DIAGNOSIS — J449 Chronic obstructive pulmonary disease, unspecified: Secondary | ICD-10-CM | POA: Diagnosis not present

## 2013-11-24 DIAGNOSIS — J209 Acute bronchitis, unspecified: Secondary | ICD-10-CM | POA: Diagnosis not present

## 2013-11-24 DIAGNOSIS — J111 Influenza due to unidentified influenza virus with other respiratory manifestations: Secondary | ICD-10-CM | POA: Diagnosis not present

## 2013-11-24 DIAGNOSIS — M2550 Pain in unspecified joint: Secondary | ICD-10-CM | POA: Diagnosis not present

## 2013-12-22 DIAGNOSIS — I119 Hypertensive heart disease without heart failure: Secondary | ICD-10-CM | POA: Diagnosis not present

## 2013-12-22 DIAGNOSIS — M2550 Pain in unspecified joint: Secondary | ICD-10-CM | POA: Diagnosis not present

## 2013-12-22 DIAGNOSIS — J449 Chronic obstructive pulmonary disease, unspecified: Secondary | ICD-10-CM | POA: Diagnosis not present

## 2013-12-22 DIAGNOSIS — IMO0001 Reserved for inherently not codable concepts without codable children: Secondary | ICD-10-CM | POA: Diagnosis not present

## 2014-01-16 DIAGNOSIS — E119 Type 2 diabetes mellitus without complications: Secondary | ICD-10-CM | POA: Diagnosis not present

## 2014-01-17 DIAGNOSIS — E559 Vitamin D deficiency, unspecified: Secondary | ICD-10-CM | POA: Diagnosis not present

## 2014-01-17 DIAGNOSIS — Z79899 Other long term (current) drug therapy: Secondary | ICD-10-CM | POA: Diagnosis not present

## 2014-01-17 DIAGNOSIS — R0609 Other forms of dyspnea: Secondary | ICD-10-CM | POA: Diagnosis not present

## 2014-01-17 DIAGNOSIS — M2550 Pain in unspecified joint: Secondary | ICD-10-CM | POA: Diagnosis not present

## 2014-01-17 DIAGNOSIS — IMO0001 Reserved for inherently not codable concepts without codable children: Secondary | ICD-10-CM | POA: Diagnosis not present

## 2014-01-17 DIAGNOSIS — R071 Chest pain on breathing: Secondary | ICD-10-CM | POA: Diagnosis not present

## 2014-01-17 DIAGNOSIS — Z125 Encounter for screening for malignant neoplasm of prostate: Secondary | ICD-10-CM | POA: Diagnosis not present

## 2014-01-17 DIAGNOSIS — I119 Hypertensive heart disease without heart failure: Secondary | ICD-10-CM | POA: Diagnosis not present

## 2014-01-17 DIAGNOSIS — R0989 Other specified symptoms and signs involving the circulatory and respiratory systems: Secondary | ICD-10-CM | POA: Diagnosis not present

## 2014-02-16 DIAGNOSIS — I119 Hypertensive heart disease without heart failure: Secondary | ICD-10-CM | POA: Diagnosis not present

## 2014-02-16 DIAGNOSIS — M2550 Pain in unspecified joint: Secondary | ICD-10-CM | POA: Diagnosis not present

## 2014-02-16 DIAGNOSIS — J449 Chronic obstructive pulmonary disease, unspecified: Secondary | ICD-10-CM | POA: Diagnosis not present

## 2014-02-16 DIAGNOSIS — R0609 Other forms of dyspnea: Secondary | ICD-10-CM | POA: Diagnosis not present

## 2014-03-20 DIAGNOSIS — J449 Chronic obstructive pulmonary disease, unspecified: Secondary | ICD-10-CM | POA: Diagnosis not present

## 2014-03-20 DIAGNOSIS — I119 Hypertensive heart disease without heart failure: Secondary | ICD-10-CM | POA: Diagnosis not present

## 2014-03-20 DIAGNOSIS — IMO0001 Reserved for inherently not codable concepts without codable children: Secondary | ICD-10-CM | POA: Diagnosis not present

## 2014-03-20 DIAGNOSIS — M2550 Pain in unspecified joint: Secondary | ICD-10-CM | POA: Diagnosis not present

## 2014-04-18 DIAGNOSIS — I428 Other cardiomyopathies: Secondary | ICD-10-CM | POA: Diagnosis not present

## 2014-04-18 DIAGNOSIS — R0609 Other forms of dyspnea: Secondary | ICD-10-CM | POA: Diagnosis not present

## 2014-04-18 DIAGNOSIS — M2550 Pain in unspecified joint: Secondary | ICD-10-CM | POA: Diagnosis not present

## 2014-04-18 DIAGNOSIS — J410 Simple chronic bronchitis: Secondary | ICD-10-CM | POA: Diagnosis not present

## 2014-05-16 DIAGNOSIS — E7800 Pure hypercholesterolemia, unspecified: Secondary | ICD-10-CM | POA: Diagnosis not present

## 2014-05-16 DIAGNOSIS — I119 Hypertensive heart disease without heart failure: Secondary | ICD-10-CM | POA: Diagnosis not present

## 2014-05-16 DIAGNOSIS — I428 Other cardiomyopathies: Secondary | ICD-10-CM | POA: Diagnosis not present

## 2014-05-16 DIAGNOSIS — M2550 Pain in unspecified joint: Secondary | ICD-10-CM | POA: Diagnosis not present

## 2014-05-16 DIAGNOSIS — IMO0001 Reserved for inherently not codable concepts without codable children: Secondary | ICD-10-CM | POA: Diagnosis not present

## 2014-05-16 DIAGNOSIS — J449 Chronic obstructive pulmonary disease, unspecified: Secondary | ICD-10-CM | POA: Diagnosis not present

## 2014-05-16 DIAGNOSIS — Z79899 Other long term (current) drug therapy: Secondary | ICD-10-CM | POA: Diagnosis not present

## 2014-05-26 ENCOUNTER — Ambulatory Visit: Payer: Medicare Other | Admitting: Cardiology

## 2014-06-15 ENCOUNTER — Ambulatory Visit: Payer: Medicare Other | Admitting: Cardiology

## 2014-06-16 DIAGNOSIS — M25559 Pain in unspecified hip: Secondary | ICD-10-CM | POA: Diagnosis not present

## 2014-06-16 DIAGNOSIS — G8929 Other chronic pain: Secondary | ICD-10-CM | POA: Diagnosis not present

## 2014-06-20 DIAGNOSIS — J449 Chronic obstructive pulmonary disease, unspecified: Secondary | ICD-10-CM | POA: Diagnosis not present

## 2014-06-20 DIAGNOSIS — I119 Hypertensive heart disease without heart failure: Secondary | ICD-10-CM | POA: Diagnosis not present

## 2014-06-20 DIAGNOSIS — IMO0001 Reserved for inherently not codable concepts without codable children: Secondary | ICD-10-CM | POA: Diagnosis not present

## 2014-06-20 DIAGNOSIS — I428 Other cardiomyopathies: Secondary | ICD-10-CM | POA: Diagnosis not present

## 2014-06-20 DIAGNOSIS — M2550 Pain in unspecified joint: Secondary | ICD-10-CM | POA: Diagnosis not present

## 2014-07-12 ENCOUNTER — Ambulatory Visit: Payer: Medicare Other | Admitting: Cardiology

## 2014-08-11 DIAGNOSIS — Z13228 Encounter for screening for other metabolic disorders: Secondary | ICD-10-CM | POA: Diagnosis not present

## 2014-08-11 DIAGNOSIS — E559 Vitamin D deficiency, unspecified: Secondary | ICD-10-CM | POA: Diagnosis not present

## 2014-08-11 DIAGNOSIS — E119 Type 2 diabetes mellitus without complications: Secondary | ICD-10-CM | POA: Diagnosis not present

## 2014-08-11 DIAGNOSIS — E785 Hyperlipidemia, unspecified: Secondary | ICD-10-CM | POA: Diagnosis not present

## 2014-08-11 DIAGNOSIS — G894 Chronic pain syndrome: Secondary | ICD-10-CM | POA: Diagnosis not present

## 2014-08-11 DIAGNOSIS — J45909 Unspecified asthma, uncomplicated: Secondary | ICD-10-CM | POA: Diagnosis not present

## 2014-08-11 DIAGNOSIS — M25559 Pain in unspecified hip: Secondary | ICD-10-CM | POA: Diagnosis not present

## 2014-09-22 DIAGNOSIS — E559 Vitamin D deficiency, unspecified: Secondary | ICD-10-CM | POA: Diagnosis not present

## 2014-09-22 DIAGNOSIS — E782 Mixed hyperlipidemia: Secondary | ICD-10-CM | POA: Diagnosis not present

## 2014-09-22 DIAGNOSIS — M25559 Pain in unspecified hip: Secondary | ICD-10-CM | POA: Diagnosis not present

## 2014-09-22 DIAGNOSIS — E119 Type 2 diabetes mellitus without complications: Secondary | ICD-10-CM | POA: Diagnosis not present

## 2014-10-09 DIAGNOSIS — I10 Essential (primary) hypertension: Secondary | ICD-10-CM | POA: Diagnosis not present

## 2014-10-09 DIAGNOSIS — E559 Vitamin D deficiency, unspecified: Secondary | ICD-10-CM | POA: Diagnosis not present

## 2014-10-09 DIAGNOSIS — G894 Chronic pain syndrome: Secondary | ICD-10-CM | POA: Diagnosis not present

## 2014-10-09 DIAGNOSIS — M25559 Pain in unspecified hip: Secondary | ICD-10-CM | POA: Diagnosis not present

## 2014-10-26 ENCOUNTER — Emergency Department (HOSPITAL_COMMUNITY)
Admission: EM | Admit: 2014-10-26 | Discharge: 2014-10-26 | Disposition: A | Discharge status: Home / Self Care | Payer: Medicare Other | Attending: Emergency Medicine | Admitting: Emergency Medicine

## 2014-10-26 ENCOUNTER — Encounter (HOSPITAL_COMMUNITY): Payer: Self-pay | Admitting: Emergency Medicine

## 2014-10-26 ENCOUNTER — Emergency Department (HOSPITAL_COMMUNITY): Payer: Medicare Other

## 2014-10-26 DIAGNOSIS — Z720 Tobacco use: Secondary | ICD-10-CM | POA: Diagnosis not present

## 2014-10-26 DIAGNOSIS — E780 Pure hypercholesterolemia: Secondary | ICD-10-CM | POA: Insufficient documentation

## 2014-10-26 DIAGNOSIS — Z79899 Other long term (current) drug therapy: Secondary | ICD-10-CM | POA: Insufficient documentation

## 2014-10-26 DIAGNOSIS — I10 Essential (primary) hypertension: Secondary | ICD-10-CM | POA: Diagnosis not present

## 2014-10-26 DIAGNOSIS — E119 Type 2 diabetes mellitus without complications: Secondary | ICD-10-CM | POA: Insufficient documentation

## 2014-10-26 DIAGNOSIS — J811 Chronic pulmonary edema: Secondary | ICD-10-CM | POA: Diagnosis not present

## 2014-10-26 DIAGNOSIS — R0602 Shortness of breath: Secondary | ICD-10-CM | POA: Diagnosis not present

## 2014-10-26 DIAGNOSIS — J159 Unspecified bacterial pneumonia: Secondary | ICD-10-CM | POA: Diagnosis not present

## 2014-10-26 DIAGNOSIS — R079 Chest pain, unspecified: Secondary | ICD-10-CM | POA: Diagnosis not present

## 2014-10-26 DIAGNOSIS — J189 Pneumonia, unspecified organism: Secondary | ICD-10-CM

## 2014-10-26 HISTORY — DX: Type 2 diabetes mellitus without complications: E11.9

## 2014-10-26 HISTORY — DX: Essential (primary) hypertension: I10

## 2014-10-26 HISTORY — DX: Pure hypercholesterolemia, unspecified: E78.00

## 2014-10-26 LAB — URINALYSIS, ROUTINE W REFLEX MICROSCOPIC
Bilirubin Urine: NEGATIVE
Glucose, UA: NEGATIVE mg/dL
Hgb urine dipstick: NEGATIVE
Ketones, ur: NEGATIVE mg/dL
Leukocytes, UA: NEGATIVE
Nitrite: NEGATIVE
Protein, ur: NEGATIVE mg/dL
Specific Gravity, Urine: 1.017 (ref 1.005–1.030)
Urobilinogen, UA: 0.2 mg/dL (ref 0.0–1.0)
pH: 6 (ref 5.0–8.0)

## 2014-10-26 LAB — BLOOD GAS, VENOUS
ACID-BASE EXCESS: 1.6 mmol/L (ref 0.0–2.0)
BICARBONATE: 27 meq/L — AB (ref 20.0–24.0)
FIO2: 0.21 %
O2 SAT: 92 %
PO2 VEN: 60.4 mmHg — AB (ref 30.0–45.0)
Patient temperature: 98.1
TCO2: 24.4 mmol/L (ref 0–100)
pCO2, Ven: 47.6 mmHg (ref 45.0–50.0)
pH, Ven: 7.371 — ABNORMAL HIGH (ref 7.250–7.300)

## 2014-10-26 LAB — COMPREHENSIVE METABOLIC PANEL
ALT: 20 U/L (ref 0–53)
AST: 21 U/L (ref 0–37)
Albumin: 3.5 g/dL (ref 3.5–5.2)
Alkaline Phosphatase: 120 U/L — ABNORMAL HIGH (ref 39–117)
Anion gap: 8 (ref 5–15)
BUN: 11 mg/dL (ref 6–23)
CO2: 29 mmol/L (ref 19–32)
Calcium: 8.5 mg/dL (ref 8.4–10.5)
Chloride: 101 mEq/L (ref 96–112)
Creatinine, Ser: 0.82 mg/dL (ref 0.50–1.35)
GFR calc Af Amer: >90 mL/min (ref >90)
GFR calc non Af Amer: >90 mL/min (ref >90)
Glucose, Bld: 226 mg/dL — ABNORMAL HIGH (ref 70–99)
Potassium: 3.5 mmol/L (ref 3.5–5.1)
Sodium: 138 mmol/L (ref 135–145)
Total Bilirubin: 0.4 mg/dL (ref 0.3–1.2)
Total Protein: 7.3 g/dL (ref 6.0–8.3)

## 2014-10-26 LAB — BRAIN NATRIURETIC PEPTIDE: B Natriuretic Peptide: 366.1 pg/mL — ABNORMAL HIGH (ref 0.0–100.0)

## 2014-10-26 LAB — TROPONIN I: Troponin I: 0.03 ng/mL (ref <0.031)

## 2014-10-26 MED ORDER — BENZONATATE 100 MG PO CAPS: 100.0000 mg | ORAL_CAPSULE | Freq: Three times a day (TID) | ORAL | Status: DC

## 2014-10-26 MED ORDER — LEVOFLOXACIN 750 MG PO TABS
750.0000 mg | ORAL_TABLET | Freq: Once | ORAL | Status: AC
  Administered 2014-10-26: 750 mg via ORAL
  Filled 2014-10-26: qty 1

## 2014-10-26 MED ORDER — ASPIRIN 325 MG PO TABS
325.0000 mg | ORAL_TABLET | ORAL | Status: AC
  Administered 2014-10-26: 325 mg via ORAL
  Filled 2014-10-26: qty 1

## 2014-10-26 MED ORDER — IPRATROPIUM-ALBUTEROL 0.5-2.5 (3) MG/3ML IN SOLN
3.0000 mL | Freq: Once | RESPIRATORY_TRACT | Status: AC
  Administered 2014-10-26: 3 mL via RESPIRATORY_TRACT
  Filled 2014-10-26: qty 3

## 2014-10-26 MED ORDER — LEVOFLOXACIN 250 MG PO TABS: 750.0000 mg | ORAL_TABLET | Freq: Every day | ORAL | Status: DC

## 2014-10-26 NOTE — ED Provider Notes (Signed)
CSN: [id number]     Arrival date & time 10/26/14  1920 History   First MD Initiated Contact with Patient 10/26/14 1953     Chief Complaint  Patient presents with  . Chest Pain     (Consider location/radiation/quality/duration/timing/severity/associated sxs/prior Treatment) HPI  Nicholas Caldwell is a 68 y.o. male with PMH of HTN, DM, Hypercholesterolemia presenting with chest pain and left-sided chest for 2 weeks that has been intermittent and associated with shortness of breath. It is become more persistent in the last couple days. Patient has not taken anything for this pain. Pain is described as a chest tightness. Pain is worse with deep breathing, lying down as well as exertion. Patient states he wakes up in the middle of night with shortness of breath. He has also had subjective fevers as well as productive cough for the past 2 weeks. He has been taking an inhaler that he was prescribed for bronchitis a year ago with some improvement of his symptoms. He denies a history of swelling in his legs. He should states he has seen a cardiologist 3 years ago and had an echo. I told him he had an enlarged heart but otherwise was okay. They did not mention anything about heart failure at that time. Pt does smoke and has been trying to stop.    Past Medical History  Diagnosis Date  . Hypertension   . Diabetes mellitus without complication   . Hypercholesteremia    History reviewed. No pertinent past surgical history. History reviewed. No pertinent family history. History  Substance Use Topics  . Smoking status: Current Some Day Smoker -- 0.05 packs/day    Types: Cigarettes  . Smokeless tobacco: Never Used  . Alcohol Use: No    Review of Systems  Constitutional: Positive for fever. Negative for chills.  HENT: Positive for congestion and rhinorrhea.   Eyes: Negative for visual disturbance.  Respiratory: Positive for cough and shortness of breath.   Cardiovascular: Positive for chest pain.  Negative for palpitations.  Gastrointestinal: Negative for nausea, vomiting and diarrhea.  Musculoskeletal: Negative for back pain and gait problem.  Skin: Negative for rash.  Neurological: Negative for weakness and headaches.      Allergies  Review of patient's allergies indicates no known allergies.  Home Medications   Prior to Admission medications   Medication Sig Start Date End Date Taking? Authorizing Provider  celecoxib (CELEBREX) 200 MG capsule Take 200 mg by mouth daily as needed for mild pain or moderate pain.   Yes Historical Provider, MD  glipiZIDE (GLUCOTROL) 10 MG tablet Take 20 mg by mouth 2 (two) times daily before a meal.   Yes Historical Provider, MD  metFORMIN (GLUCOPHAGE) 1000 MG tablet Take 1,000 mg by mouth 2 (two) times daily with a meal.   Yes Historical Provider, MD  methadone (DOLOPHINE) 10 MG tablet Take 10 mg by mouth every 12 (twelve) hours.   Yes Historical Provider, MD  oxyCODONE-acetaminophen (PERCOCET) 10-325 MG per tablet Take 1 tablet by mouth every 4 (four) hours as needed for pain.   Yes Historical Provider, MD  rosuvastatin (CRESTOR) 20 MG tablet Take 20 mg by mouth daily.   Yes Historical Provider, MD  theophylline (UNIPHYL) 400 MG 24 hr tablet Take 200 mg by mouth daily.   Yes Historical Provider, MD  Vitamin D, Ergocalciferol, (DRISDOL) 50000 UNITS CAPS capsule Take 50,000 Units by mouth every 7 (seven) days. On Sunday.   Yes Historical Provider, MD  benzonatate (TESSALON) 100 MG capsule Take  1 capsule (100 mg total) by mouth every 8 (eight) hours. 10/26/14   Pura Spice, PA-C  levofloxacin (LEVAQUIN) 250 MG tablet Take 3 tablets (750 mg total) by mouth daily. 10/26/14   Pura Spice, PA-C  oxyCODONE-acetaminophen (PERCOCET/ROXICET) 5-325 MG per tablet Take 1 tablet by mouth every 4 (four) hours as needed for moderate pain or severe pain.    Historical Provider, MD   BP 162/90 mmHg  Pulse 116  Temp(Src) 98.1 F (36.7 C) (Oral)  Resp  29  Ht 6' (1.829 m)  Wt 246 lb (111.585 kg)  BMI 33.36 kg/m2  SpO2 92% Physical Exam  Constitutional: He appears well-developed and well-nourished. No distress.  HENT:  Head: Normocephalic and atraumatic.  Mouth/Throat: Posterior oropharyngeal erythema present. No oropharyngeal exudate or posterior oropharyngeal edema.  Eyes: Conjunctivae and EOM are normal. Right eye exhibits no discharge. Left eye exhibits no discharge.  Neck: No JVD present.  Cardiovascular: Normal rate, regular rhythm and normal heart sounds.   No leg swelling or tenderness. Negative Homan's sign.  Pulmonary/Chest: Effort normal and breath sounds normal.  Diffuse wheezing with increased work of breathing with mild to moderate respiratory distress.  Abdominal: Soft. Bowel sounds are normal. He exhibits no distension. There is no tenderness.  Neurological: He is alert. He exhibits normal muscle tone. Coordination normal.  Skin: Skin is warm and dry. He is not diaphoretic.  Nursing note and vitals reviewed.   ED Course  Procedures (including critical care time) Labs Review Labs Reviewed  BRAIN NATRIURETIC PEPTIDE - Abnormal; Notable for the following:    B Natriuretic Peptide 366.1 (*)    All other components within normal limits  COMPREHENSIVE METABOLIC PANEL - Abnormal; Notable for the following:    Glucose, Bld 226 (*)    Alkaline Phosphatase 120 (*)    All other components within normal limits  BLOOD GAS, VENOUS - Abnormal; Notable for the following:    pH, Ven 7.371 (*)    pO2, Ven 60.4 (*)    Bicarbonate 27.0 (*)    All other components within normal limits  TROPONIN I  CBC  URINALYSIS, ROUTINE W REFLEX MICROSCOPIC  CBC    Imaging Review Dg Chest Port 1 View  10/26/2014   CLINICAL DATA:  Shortness of breath, hypertension, diabetes, smoker  EXAM: PORTABLE CHEST - 1 VIEW  COMPARISON:  Portable exam 2006 hr compared to 03/27/2005  FINDINGS: Enlargement of cardiac silhouette.  Atherosclerotic  calcification of a mildly tortuous thoracic aorta.  Slight pulmonary vascular congestion.  Scattered interstitial infiltrates new since previous exam question edema versus infection.  No pleural effusion or pneumothorax.  Scattered endplate spur formation thoracic spine.  IMPRESSION: Enlargement of cardiac silhouette with pulmonary vascular congestion and scattered interstitial infiltrates which could represent pulmonary edema or infection, new since previous study.   Electronically Signed   By: Lavonia Dana M.D.   On: 10/26/2014 20:23     EKG Interpretation   Date/Time:  Thursday October 26 2014 19:27:36 EST Ventricular Rate:  108 PR Interval:  165 QRS Duration: 104 QT Interval:  328 QTC Calculation: 440 R Axis:   77 Text Interpretation:  Sinus tachycardia frequent PACs Probable left  ventricular hypertrophy LAE Borderline T abnormalities, lateral leads  Baseline wander in lead(s) II III aVF V3 Confirmed by [place]  ([id number]) on 10/26/2014 9:07:31 PM      MDM   Final diagnoses:  CAP (community acquired pneumonia)   Patient with two-week intermittent history of  chest tightness worse with deep breathing, lying down, activity as well as tactile fevers and productive cough. Pt with smoking history.  Patient states there is mild improvement with his home albuterol inhaler. Patient with diffuse wheezing on exam as well as increased work of breathing. Patient given nebulizer treatment in ED with improvement of his symptoms. He states he is breathing at baseline. She also with improvement on lung exam. He is moving more air. No respiratory distress at this time.  Patient's chest x-ray with possible edema or infection. Patient with history of cardiomegaly but no heart failure. No JVD, peripheral swelling. Patient does not appear volume overloaded. I doubt CHF exacerbation. I suspect symptoms are likely related to pneumonia. Due to patient's comorbidities treat with Levaquin. Discussed side  effects of levaquin including peripheral neuropathy as well as tendon rupture. Discussed coming in if any of these symptoms occur before.  Patient ambulated in ED with oxygen stats above 91%. During ambulation patient's pulse increased to 148. Patient did not have any chest pain, dyspnea. He states his chest pain has completely resolved. Patient wants to go home. I think this is reasonable due to his improvement of symptoms and lung exam and pulse returning to normal as well as his respiration rate at rest. No respiratory distress. Discussed option of pt being admitted and patient refused. Patient is afebrile, nontoxic, and in no acute distress. Patient is appropriate for outpatient management and is stable for discharge. Pt to follow up with his PCP in 3 days.  Discussed return precautions with patient. Discussed all results and patient verbalizes understanding and agrees with plan.  This is a shared patient. This patient was discussed with the physician who saw and evaluated the patient and agrees with the plan.     Pura Spice, PA-C 10/27/14 0021  Virgel Manifold, MD 10/30/14 [phone]

## 2014-10-26 NOTE — ED Notes (Signed)
Pt arrived to the ED with a complaint of chest pain.  Pain is located in the left side of the chest with radiation to the lungs.  Pt has shortness of breath.  Pt states pain has been present since Monday.  Pt describes pain as a pressure.  Pt states pain is worse when he lies down

## 2014-10-26 NOTE — Progress Notes (Signed)
  CARE MANAGEMENT ED NOTE 10/26/2014  Patient:  Nicholas Caldwell, Nicholas Caldwell   Account Number:  [id number]  Date Initiated:  10/26/2014  Documentation initiated by:  Livia Snellen  Subjective/Objective Assessment:   Patient presents to Ed with chest pain     Subjective/Objective Assessment Detail:     Action/Plan:   Action/Plan Detail:   Anticipated DC Date:       Status Recommendation to Physician:   Result of Recommendation:    Other ED [place]  Other  PCP issues    Choice offered to / List presented to:            Status of service:  Completed, signed off  ED Comments:   ED Comments Detail:  EDCM spoke to patient at bedside.  Patient reports his pcp is Dr. Lyndel Safe at [facility]. System updated.

## 2014-10-26 NOTE — Discharge Instructions (Signed)
Return to the emergency room with worsening of symptoms, new symptoms or with symptoms that are concerning, especially fevers not decreased with ibuprofen or tylenol, coughing up blood, worsening or persistent chest pain or shortness of breath.  Please take all of your antibiotics until finished!   You may develop abdominal discomfort or diarrhea from the antibiotic.  You may help offset this with probiotics which you can buy or get in yogurt. Do not eat  or take the probiotics until 2 hours after your antibiotic.  Please call your doctor for a followup appointment within 24-48 hours. When you talk to your doctor please let them know that you were seen in the emergency department and have them acquire all of your records so that they can discuss the findings with you and formulate a treatment plan to fully care for your new and ongoing problems.   Pneumonia Pneumonia is an infection of the lungs.  CAUSES Pneumonia may be caused by bacteria or a virus. Usually, these infections are caused by breathing infectious particles into the lungs (respiratory tract). SIGNS AND SYMPTOMS   Cough.  Fever.  Chest pain.  Increased rate of breathing.  Wheezing.  Mucus production. DIAGNOSIS  If you have the common symptoms of pneumonia, your health care provider will typically confirm the diagnosis with a chest X-ray. The X-ray will show an abnormality in the lung (pulmonary infiltrate) if you have pneumonia. Other tests of your blood, urine, or sputum may be done to find the specific cause of your pneumonia. Your health care provider may also do tests (blood gases or pulse oximetry) to see how well your lungs are working. TREATMENT  Some forms of pneumonia may be spread to other people when you cough or sneeze. You may be asked to wear a mask before and during your exam. Pneumonia that is caused by bacteria is treated with antibiotic medicine. Pneumonia that is caused by the influenza virus may be treated  with an antiviral medicine. Most other viral infections must run their course. These infections will not respond to antibiotics.  HOME CARE INSTRUCTIONS   Cough suppressants may be used if you are losing too much rest. However, coughing protects you by clearing your lungs. You should avoid using cough suppressants if you can.  Your health care provider may have prescribed medicine if he or she thinks your pneumonia is caused by bacteria or influenza. Finish your medicine even if you start to feel better.  Your health care provider may also prescribe an expectorant. This loosens the mucus to be coughed up.  Take medicines only as directed by your health care provider.  Do not smoke. Smoking is a common cause of bronchitis and can contribute to pneumonia. If you are a smoker and continue to smoke, your cough may last several weeks after your pneumonia has cleared.  A cold steam vaporizer or humidifier in your room or home may help loosen mucus.  Coughing is often worse at night. Sleeping in a semi-upright position in a recliner or using a couple pillows under your head will help with this.  Get rest as you feel it is needed. Your body will usually let you know when you need to rest. PREVENTION A pneumococcal shot (vaccine) is available to prevent a common bacterial cause of pneumonia. This is usually suggested for:  People over 35 years old.  Patients on chemotherapy.  People with chronic lung problems, such as bronchitis or emphysema.  People with immune system problems. If you  are over 38 or have a high risk condition, you may receive the pneumococcal vaccine if you have not received it before. In some countries, a routine influenza vaccine is also recommended. This vaccine can help prevent some cases of pneumonia.You may be offered the influenza vaccine as part of your care. If you smoke, it is time to quit. You may receive instructions on how to stop smoking. Your health care provider  can provide medicines and counseling to help you quit. SEEK MEDICAL CARE IF: You have a fever. SEEK IMMEDIATE MEDICAL CARE IF:   Your illness becomes worse. This is especially true if you are elderly or weakened from any other disease.  You cannot control your cough with suppressants and are losing sleep.  You begin coughing up blood.  You develop pain which is getting worse or is uncontrolled with medicines.  Any of the symptoms which initially brought you in for treatment are getting worse rather than better.  You develop shortness of breath or chest pain. MAKE SURE YOU:   Understand these instructions.  Will watch your condition.  Will get help right away if you are not doing well or get worse. Document Released: 10/20/2005 Document Revised: 03/06/2014 Document Reviewed: 01/09/2011 [facility] Patient Information 2015 [place], [place]. This information is not intended to replace advice given to you by your health care provider. Make sure you discuss any questions you have with your health care provider.

## 2014-10-31 ENCOUNTER — Inpatient Hospital Stay (HOSPITAL_COMMUNITY)
Admission: EM | Admit: 2014-10-31 | Discharge: 2014-11-07 | DRG: 287 | Disposition: A | Discharge status: Home / Self Care | Payer: Medicare Other | Attending: Internal Medicine | Admitting: Internal Medicine

## 2014-10-31 ENCOUNTER — Emergency Department (HOSPITAL_COMMUNITY): Payer: Medicare Other

## 2014-10-31 ENCOUNTER — Encounter (HOSPITAL_COMMUNITY): Payer: Self-pay | Admitting: Emergency Medicine

## 2014-10-31 DIAGNOSIS — Z6833 Body mass index (BMI) 33.0-33.9, adult: Secondary | ICD-10-CM

## 2014-10-31 DIAGNOSIS — E669 Obesity, unspecified: Secondary | ICD-10-CM | POA: Diagnosis not present

## 2014-10-31 DIAGNOSIS — R0602 Shortness of breath: Secondary | ICD-10-CM | POA: Diagnosis present

## 2014-10-31 DIAGNOSIS — Z720 Tobacco use: Secondary | ICD-10-CM | POA: Diagnosis not present

## 2014-10-31 DIAGNOSIS — I10 Essential (primary) hypertension: Secondary | ICD-10-CM | POA: Diagnosis present

## 2014-10-31 DIAGNOSIS — E1169 Type 2 diabetes mellitus with other specified complication: Secondary | ICD-10-CM | POA: Diagnosis present

## 2014-10-31 DIAGNOSIS — I5023 Acute on chronic systolic (congestive) heart failure: Secondary | ICD-10-CM | POA: Diagnosis present

## 2014-10-31 DIAGNOSIS — J811 Chronic pulmonary edema: Secondary | ICD-10-CM | POA: Diagnosis not present

## 2014-10-31 DIAGNOSIS — E1165 Type 2 diabetes mellitus with hyperglycemia: Secondary | ICD-10-CM | POA: Diagnosis not present

## 2014-10-31 DIAGNOSIS — I5021 Acute systolic (congestive) heart failure: Principal | ICD-10-CM | POA: Diagnosis present

## 2014-10-31 DIAGNOSIS — J441 Chronic obstructive pulmonary disease with (acute) exacerbation: Secondary | ICD-10-CM | POA: Diagnosis not present

## 2014-10-31 DIAGNOSIS — E876 Hypokalemia: Secondary | ICD-10-CM | POA: Diagnosis present

## 2014-10-31 DIAGNOSIS — E1140 Type 2 diabetes mellitus with diabetic neuropathy, unspecified: Secondary | ICD-10-CM

## 2014-10-31 DIAGNOSIS — F17210 Nicotine dependence, cigarettes, uncomplicated: Secondary | ICD-10-CM | POA: Diagnosis present

## 2014-10-31 DIAGNOSIS — R079 Chest pain, unspecified: Secondary | ICD-10-CM | POA: Diagnosis not present

## 2014-10-31 DIAGNOSIS — I255 Ischemic cardiomyopathy: Secondary | ICD-10-CM | POA: Diagnosis present

## 2014-10-31 DIAGNOSIS — Z794 Long term (current) use of insulin: Secondary | ICD-10-CM

## 2014-10-31 DIAGNOSIS — E785 Hyperlipidemia, unspecified: Secondary | ICD-10-CM

## 2014-10-31 DIAGNOSIS — R0600 Dyspnea, unspecified: Secondary | ICD-10-CM

## 2014-10-31 DIAGNOSIS — J449 Chronic obstructive pulmonary disease, unspecified: Secondary | ICD-10-CM | POA: Diagnosis not present

## 2014-10-31 DIAGNOSIS — R7989 Other specified abnormal findings of blood chemistry: Secondary | ICD-10-CM

## 2014-10-31 DIAGNOSIS — E119 Type 2 diabetes mellitus without complications: Secondary | ICD-10-CM | POA: Diagnosis not present

## 2014-10-31 DIAGNOSIS — R0789 Other chest pain: Secondary | ICD-10-CM

## 2014-10-31 DIAGNOSIS — E780 Pure hypercholesterolemia: Secondary | ICD-10-CM | POA: Diagnosis present

## 2014-10-31 DIAGNOSIS — R140 Abdominal distension (gaseous): Secondary | ICD-10-CM | POA: Diagnosis not present

## 2014-10-31 DIAGNOSIS — R778 Other specified abnormalities of plasma proteins: Secondary | ICD-10-CM | POA: Diagnosis present

## 2014-10-31 DIAGNOSIS — I517 Cardiomegaly: Secondary | ICD-10-CM | POA: Diagnosis not present

## 2014-10-31 DIAGNOSIS — I509 Heart failure, unspecified: Secondary | ICD-10-CM

## 2014-10-31 LAB — D-DIMER, QUANTITATIVE (NOT AT ARMC): D DIMER QUANT: 0.42 ug{FEU}/mL (ref 0.00–0.48)

## 2014-10-31 LAB — CBC
HCT: 39.6 % (ref 39.0–52.0)
HCT: 39.8 % (ref 39.0–52.0)
HEMOGLOBIN: 12.3 g/dL — AB (ref 13.0–17.0)
Hemoglobin: 12.7 g/dL — ABNORMAL LOW (ref 13.0–17.0)
MCH: 26.6 pg (ref 26.0–34.0)
MCH: 27.4 pg (ref 26.0–34.0)
MCHC: 30.9 g/dL (ref 30.0–36.0)
MCHC: 32.1 g/dL (ref 30.0–36.0)
MCV: 85.5 fL (ref 78.0–100.0)
MCV: 86.1 fL (ref 78.0–100.0)
Platelets: 394 10*3/uL (ref 150–400)
Platelets: 430 10*3/uL — ABNORMAL HIGH (ref 150–400)
RBC: 4.62 MIL/uL (ref 4.22–5.81)
RBC: 4.63 MIL/uL (ref 4.22–5.81)
RDW: 14.8 % (ref 11.5–15.5)
RDW: 14.9 % (ref 11.5–15.5)
WBC: 7.2 10*3/uL (ref 4.0–10.5)
WBC: 7.4 10*3/uL (ref 4.0–10.5)

## 2014-10-31 LAB — BASIC METABOLIC PANEL
ANION GAP: 7 (ref 5–15)
BUN: 12 mg/dL (ref 6–23)
CHLORIDE: 105 meq/L (ref 96–112)
CO2: 27 mmol/L (ref 19–32)
Calcium: 8.8 mg/dL (ref 8.4–10.5)
Creatinine, Ser: 0.84 mg/dL (ref 0.50–1.35)
GFR calc non Af Amer: 89 mL/min — ABNORMAL LOW (ref >90)
Glucose, Bld: 211 mg/dL — ABNORMAL HIGH (ref 70–99)
Potassium: 4 mmol/L (ref 3.5–5.1)
Sodium: 139 mmol/L (ref 135–145)

## 2014-10-31 LAB — TROPONIN I
Troponin I: 0.06 ng/mL — ABNORMAL HIGH (ref <0.031)
Troponin I: 0.05 ng/mL — ABNORMAL HIGH (ref <0.031)

## 2014-10-31 MED ORDER — INSULIN ASPART 100 UNIT/ML ~~LOC~~ SOLN
0.0000 [IU] | Freq: Three times a day (TID) | SUBCUTANEOUS | Status: DC
  Administered 2014-11-01: 3 [IU] via SUBCUTANEOUS
  Administered 2014-11-01 – 2014-11-02 (×2): 5 [IU] via SUBCUTANEOUS
  Administered 2014-11-02: 3 [IU] via SUBCUTANEOUS
  Administered 2014-11-02 – 2014-11-03 (×2): 5 [IU] via SUBCUTANEOUS
  Administered 2014-11-03: 2 [IU] via SUBCUTANEOUS

## 2014-10-31 MED ORDER — PREDNISONE (PAK) 10 MG PO TABS: 10.0000 mg | ORAL_TABLET | ORAL | Status: DC

## 2014-10-31 MED ORDER — OXYCODONE-ACETAMINOPHEN 10-325 MG PO TABS: 1.0000 | ORAL_TABLET | Freq: Four times a day (QID) | ORAL | Status: DC | PRN

## 2014-10-31 MED ORDER — METFORMIN HCL 500 MG PO TABS
1000.0000 mg | ORAL_TABLET | Freq: Two times a day (BID) | ORAL | Status: DC
  Administered 2014-11-01 – 2014-11-05 (×10): 1000 mg via ORAL
  Filled 2014-10-31 (×13): qty 2

## 2014-10-31 MED ORDER — VITAMIN D (ERGOCALCIFEROL) 1.25 MG (50000 UNIT) PO CAPS
50000.0000 [IU] | ORAL_CAPSULE | ORAL | Status: DC
  Administered 2014-11-05: 50000 [IU] via ORAL
  Filled 2014-10-31: qty 1

## 2014-10-31 MED ORDER — ONDANSETRON HCL 4 MG/2ML IJ SOLN
4.0000 mg | Freq: Four times a day (QID) | INTRAMUSCULAR | Status: DC | PRN
Start: 2014-10-31 — End: 2014-11-07

## 2014-10-31 MED ORDER — PREDNISONE (PAK) 10 MG PO TABS
10.0000 mg | ORAL_TABLET | ORAL | Status: AC
  Administered 2014-11-01: 10 mg via ORAL

## 2014-10-31 MED ORDER — GLIPIZIDE 10 MG PO TABS
20.0000 mg | ORAL_TABLET | Freq: Two times a day (BID) | ORAL | Status: DC
  Administered 2014-11-01 – 2014-11-07 (×12): 20 mg via ORAL
  Filled 2014-10-31 (×17): qty 2

## 2014-10-31 MED ORDER — OLMESARTAN-AMLODIPINE-HCTZ 20-5-12.5 MG PO TABS: ORAL_TABLET | Freq: Every day | ORAL | Status: DC

## 2014-10-31 MED ORDER — THEOPHYLLINE ER 400 MG PO TB24
200.0000 mg | ORAL_TABLET | Freq: Every day | ORAL | Status: DC
  Administered 2014-11-01 – 2014-11-07 (×7): 200 mg via ORAL
  Filled 2014-10-31 (×8): qty 0.5

## 2014-10-31 MED ORDER — ROSUVASTATIN CALCIUM 20 MG PO TABS
20.0000 mg | ORAL_TABLET | Freq: Every day | ORAL | Status: DC
  Administered 2014-11-01 – 2014-11-07 (×7): 20 mg via ORAL
  Filled 2014-10-31 (×7): qty 1

## 2014-10-31 MED ORDER — OXYCODONE HCL 5 MG PO TABS
5.0000 mg | ORAL_TABLET | Freq: Four times a day (QID) | ORAL | Status: DC | PRN
  Administered 2014-10-31 – 2014-11-07 (×22): 5 mg via ORAL
  Filled 2014-10-31 (×22): qty 1

## 2014-10-31 MED ORDER — OXYCODONE-ACETAMINOPHEN 5-325 MG PO TABS
1.0000 | ORAL_TABLET | Freq: Once | ORAL | Status: AC
  Administered 2014-10-31: 1 via ORAL
  Filled 2014-10-31: qty 1

## 2014-10-31 MED ORDER — PREDNISONE (PAK) 10 MG PO TABS: 20.0000 mg | ORAL_TABLET | Freq: Every evening | ORAL | Status: DC

## 2014-10-31 MED ORDER — IRBESARTAN 150 MG PO TABS
150.0000 mg | ORAL_TABLET | Freq: Every day | ORAL | Status: DC
  Administered 2014-11-01: 150 mg via ORAL
  Filled 2014-10-31 (×2): qty 1

## 2014-10-31 MED ORDER — PREDNISONE (PAK) 10 MG PO TABS
20.0000 mg | ORAL_TABLET | Freq: Every morning | ORAL | Status: AC
Start: 2014-11-01 — End: 2014-11-01
  Administered 2014-11-01: 20 mg via ORAL
  Filled 2014-10-31: qty 21

## 2014-10-31 MED ORDER — METHADONE HCL 10 MG PO TABS
10.0000 mg | ORAL_TABLET | Freq: Once | ORAL | Status: AC
  Administered 2014-10-31: 10 mg via ORAL
  Filled 2014-10-31: qty 1

## 2014-10-31 MED ORDER — HEPARIN SODIUM (PORCINE) 5000 UNIT/ML IJ SOLN
5000.0000 [IU] | Freq: Three times a day (TID) | INTRAMUSCULAR | Status: DC
  Administered 2014-11-01 – 2014-11-03 (×2): 5000 [IU] via SUBCUTANEOUS
  Filled 2014-10-31 (×23): qty 1

## 2014-10-31 MED ORDER — PREDNISONE (PAK) 10 MG PO TABS
10.0000 mg | ORAL_TABLET | Freq: Three times a day (TID) | ORAL | Status: DC
  Administered 2014-11-01: 30 mg via ORAL

## 2014-10-31 MED ORDER — ACETAMINOPHEN 325 MG PO TABS
650.0000 mg | ORAL_TABLET | ORAL | Status: DC | PRN
  Filled 2014-10-31: qty 2

## 2014-10-31 MED ORDER — HYDROCHLOROTHIAZIDE 12.5 MG PO CAPS
12.5000 mg | ORAL_CAPSULE | Freq: Every day | ORAL | Status: DC
  Administered 2014-11-01: 12.5 mg via ORAL
  Filled 2014-10-31 (×2): qty 1

## 2014-10-31 MED ORDER — METHADONE HCL 10 MG PO TABS
10.0000 mg | ORAL_TABLET | Freq: Two times a day (BID) | ORAL | Status: DC
  Administered 2014-11-01 – 2014-11-07 (×14): 10 mg via ORAL
  Filled 2014-10-31 (×14): qty 1

## 2014-10-31 MED ORDER — CELECOXIB 200 MG PO CAPS
200.0000 mg | ORAL_CAPSULE | Freq: Every day | ORAL | Status: DC | PRN
  Filled 2014-10-31: qty 1

## 2014-10-31 MED ORDER — PREDNISONE (PAK) 10 MG PO TABS: 10.0000 mg | ORAL_TABLET | Freq: Four times a day (QID) | ORAL | Status: DC

## 2014-10-31 MED ORDER — FUROSEMIDE 10 MG/ML IJ SOLN
40.0000 mg | Freq: Once | INTRAMUSCULAR | Status: AC
  Administered 2014-10-31: 40 mg via INTRAVENOUS
  Filled 2014-10-31: qty 4

## 2014-10-31 MED ORDER — OXYCODONE-ACETAMINOPHEN 5-325 MG PO TABS
1.0000 | ORAL_TABLET | Freq: Four times a day (QID) | ORAL | Status: DC | PRN
  Administered 2014-10-31 – 2014-11-07 (×21): 1 via ORAL
  Filled 2014-10-31 (×21): qty 1

## 2014-10-31 MED ORDER — TIOTROPIUM BROMIDE MONOHYDRATE 18 MCG IN CAPS
18.0000 ug | ORAL_CAPSULE | Freq: Every day | RESPIRATORY_TRACT | Status: DC
  Administered 2014-11-02 – 2014-11-07 (×5): 18 ug via RESPIRATORY_TRACT
  Filled 2014-10-31 (×2): qty 5

## 2014-10-31 NOTE — ED Provider Notes (Signed)
CSN: [id number]     Arrival date & time 10/31/14  1427 History   First MD Initiated Contact with Patient 10/31/14 1509     Chief Complaint  Patient presents with  . PNA      (Consider location/radiation/quality/duration/timing/severity/associated sxs/prior Treatment) HPI.... Patient seen in the emergency department on 10/26/14 with a diagnosis of "pneumonia".   Levaquin was started. Patient continued to have dyspnea with exertion and intermittent chest pain. He was seem by his primary care doctor today who recommended a return visit to the emergency department. Cardiac risk factors include hypertension, diabetes, hypercholesterolemia, obesity.   No known cardiac problems.  Past Medical History  Diagnosis Date  . Hypertension   . Diabetes mellitus without complication   . Hypercholesteremia    History reviewed. No pertinent past surgical history. No family history on file. History  Substance Use Topics  . Smoking status: Current Some Day Smoker -- 0.05 packs/day    Types: Cigarettes  . Smokeless tobacco: Never Used  . Alcohol Use: No    Review of Systems  All other systems reviewed and are negative.     Allergies  Review of patient's allergies indicates no known allergies.  Home Medications   Prior to Admission medications   Medication Sig Start Date End Date Taking? Authorizing Provider  glipiZIDE (GLUCOTROL) 10 MG tablet Take 20 mg by mouth 2 (two) times daily before a meal.   Yes Historical Provider, MD  metFORMIN (GLUCOPHAGE) 1000 MG tablet Take 1,000 mg by mouth 2 (two) times daily with a meal.   Yes Historical Provider, MD  methadone (DOLOPHINE) 10 MG tablet Take 10 mg by mouth every 12 (twelve) hours.   Yes Historical Provider, MD  Olmesartan-Amlodipine-HCTZ (TRIBENZOR PO) Take 1 tablet by mouth daily.   Yes Historical Provider, MD  oxyCODONE-acetaminophen (PERCOCET) 10-325 MG per tablet Take 1 tablet by mouth every 6 (six) hours as needed for pain (pain).    Yes  Historical Provider, MD  oxyCODONE-acetaminophen (PERCOCET/ROXICET) 5-325 MG per tablet Take 1 tablet by mouth every 6 (six) hours as needed for moderate pain or severe pain (pain).    Yes Historical Provider, MD  rosuvastatin (CRESTOR) 20 MG tablet Take 20 mg by mouth daily.   Yes Historical Provider, MD  theophylline (UNIPHYL) 400 MG 24 hr tablet Take 200 mg by mouth daily.   Yes Historical Provider, MD  benzonatate (TESSALON) 100 MG capsule Take 1 capsule (100 mg total) by mouth every 8 (eight) hours. 10/26/14   Pura Spice, PA-C  celecoxib (CELEBREX) 200 MG capsule Take 200 mg by mouth daily as needed for mild pain or moderate pain.    Historical Provider, MD  levofloxacin (LEVAQUIN) 250 MG tablet Take 3 tablets (750 mg total) by mouth daily. Patient not taking: Reported on 10/31/2014 10/26/14   Pura Spice, PA-C  Vitamin D, Ergocalciferol, (DRISDOL) 50000 UNITS CAPS capsule Take 50,000 Units by mouth every 7 (seven) days. On Sunday.    Historical Provider, MD   BP 135/83 mmHg  Pulse 79  Temp(Src) 98.9 F (37.2 C) (Oral)  Resp 26  SpO2 100% Physical Exam  Constitutional: He is oriented to person, place, and time.  Obese, no tachypnea  HENT:  Head: Normocephalic and atraumatic.  Eyes: Conjunctivae and EOM are normal. Pupils are equal, round, and reactive to light.  Neck: Normal range of motion. Neck supple.  Cardiovascular: Normal rate and regular rhythm.   Pulmonary/Chest: Effort normal and breath sounds normal.  Abdominal: Soft. Bowel  sounds are normal.  Musculoskeletal: Normal range of motion.  Neurological: He is alert and oriented to person, place, and time.  Skin: Skin is warm and dry.  Psychiatric: He has a normal mood and affect. His behavior is normal.  Nursing note and vitals reviewed.   ED Course  Procedures (including critical care time) Labs Review Labs Reviewed  BASIC METABOLIC PANEL - Abnormal; Notable for the following:    Glucose, Bld 211 (*)     GFR calc non Af Amer 89 (*)    All other components within normal limits  CBC - Abnormal; Notable for the following:    Hemoglobin 12.3 (*)    Platelets 430 (*)    All other components within normal limits  TROPONIN I - Abnormal; Notable for the following:    Troponin I 0.06 (*)    All other components within normal limits  D-DIMER, QUANTITATIVE  TROPONIN I    Imaging Review Dg Chest 2 View  10/31/2014   CLINICAL DATA:  Dyspnea.  EXAM: CHEST  2 VIEW  COMPARISON:  October 26, 2014.  FINDINGS: Stable cardiomegaly with central pulmonary vascular congestion. Stable interstitial densities are noted throughout both lungs which may represent scarring, but superimposed pulmonary edema cannot be excluded. No pneumothorax is noted. Minimal bilateral pleural effusions are noted. Bony thorax is intact.  IMPRESSION: Stable cardiomegaly with stable central pulmonary vascular congestion. Stable interstitial densities are noted throughout both lungs which may represent scarring, but superimposed pulmonary edema cannot be excluded.   Electronically Signed   By: Sabino Dick M.D.   On: 10/31/2014 16:36     EKG Interpretation   Date/Time:  Tuesday October 31 2014 16:03:33 EST Ventricular Rate:  96 PR Interval:  175 QRS Duration: 108 QT Interval:  415 QTC Calculation: 524 R Axis:   84 Text Interpretation:  Sinus rhythm Borderline right axis deviation  Borderline T wave abnormalities Prolonged QT interval Confirmed by Lacinda Axon   MD, Rhyder Bratz ([id number]) on 10/31/2014 4:14:45 PM      MDM   Final diagnoses:  Dyspnea  Chest pain, unspecified chest pain type    Persistent chest pain and dyspnea. Chest x-ray shows cardiomegaly with pulmonary vascular congestion along with stable interstitial densities.  This could represent superimposed pulmonary edema.  IV Lasix. Admit to general medicine.    Nat Christen, MD 10/31/14 2134

## 2014-10-31 NOTE — H&P (Signed)
[facility] History and Physical  Nicholas Caldwell HWE:[id number] DOB: 09-04-1946 DOA: 10/31/2014  Referring physician: Nat Christen, MD PCP: PROVIDER NOT IN SYSTEM   Chief Complaint: Chest Pain  HPI: Nicholas Caldwell is a 68 y.o. male presents with chest pain. He states that the pain first started on and off about a month ago. He states that the pain in the center of his chest and seems to move to his stomach. Patient states he initially he went to the ED on the 24th and was told he may have pneumonia. Patient states he was given antibiotics but did not help. He has noted congestion and a cough. He states he has been more short of breath too. He states today he went to his PCP and was told on his CXR he had fluid around his heart. He was told to come into the ED. He states he has had some edema around his feet. He states that he has been having no fevers noted. He states he has no headaches noted. He does have some sinus congestion though. He states he has not had a heart attack in the past.   Review of Systems:  Constitutional:  No weight loss, night sweats, Fevers, chills, fatigue.  HEENT:  No headaches, sinus congestion Cardio-vascular:  ++chest pain, no Orthopnea, PND  GI:  No heartburn, indigestion, abdominal pain, nausea, vomiting, diarrhea  Resp:  ++shortness of breath with exertion  ++ productive cough, No coughing up of blood  Skin:  no rash or lesions GU:  no dysuria, change in color of urine, no urgency or frequency  Musculoskeletal:  No joint pain or swelling. No decreased range of motion  Psych:  No change in mood or affect. No depression or anxiety   Past Medical History  Diagnosis Date  . Hypertension   . Diabetes mellitus without complication   . Hypercholesteremia    History reviewed. No pertinent past surgical history. Social History:  reports that he has been smoking Cigarettes.  He has been smoking about 0.05 packs per day. He has never used smokeless  tobacco. He reports that he does not drink alcohol or use illicit drugs.  No Known Allergies  No family history on file.   Prior to Admission medications   Medication Sig Start Date End Date Taking? Authorizing Provider  glipiZIDE (GLUCOTROL) 10 MG tablet Take 20 mg by mouth 2 (two) times daily before a meal.   Yes Historical Provider, MD  metFORMIN (GLUCOPHAGE) 1000 MG tablet Take 1,000 mg by mouth 2 (two) times daily with a meal.   Yes Historical Provider, MD  methadone (DOLOPHINE) 10 MG tablet Take 10 mg by mouth every 12 (twelve) hours.   Yes Historical Provider, MD  Olmesartan-Amlodipine-HCTZ (TRIBENZOR PO) Take 1 tablet by mouth daily.   Yes Historical Provider, MD  oxyCODONE-acetaminophen (PERCOCET) 10-325 MG per tablet Take 1 tablet by mouth every 6 (six) hours as needed for pain (pain).    Yes Historical Provider, MD  oxyCODONE-acetaminophen (PERCOCET/ROXICET) 5-325 MG per tablet Take 1 tablet by mouth every 6 (six) hours as needed for moderate pain or severe pain (pain).    Yes Historical Provider, MD  rosuvastatin (CRESTOR) 20 MG tablet Take 20 mg by mouth daily.   Yes Historical Provider, MD  theophylline (UNIPHYL) 400 MG 24 hr tablet Take 200 mg by mouth daily.   Yes Historical Provider, MD  benzonatate (TESSALON) 100 MG capsule Take 1 capsule (100 mg total) by mouth every 8 (eight) hours. 10/26/14  Pura Spice, PA-C  celecoxib (CELEBREX) 200 MG capsule Take 200 mg by mouth daily as needed for mild pain or moderate pain.    Historical Provider, MD  levofloxacin (LEVAQUIN) 250 MG tablet Take 3 tablets (750 mg total) by mouth daily. Patient not taking: Reported on 10/31/2014 10/26/14   Pura Spice, PA-C  Vitamin D, Ergocalciferol, (DRISDOL) 50000 UNITS CAPS capsule Take 50,000 Units by mouth every 7 (seven) days. On Sunday.    Historical Provider, MD   Physical Exam: Filed Vitals:   10/31/14 1800 10/31/14 1930 10/31/14 1938 10/31/14 2000  BP: 119/107 158/58 158/58  135/83  Pulse: 100  125 79  Temp:      TempSrc:      Resp: _0 SpO2: 91%  91% 100%    Wt Readings from Last 3 Encounters:  10/26/14 111.585 kg (246 lb)    General:  Appears calm and comfortable Eyes: PERRL, normal lids, irises & conjunctiva ENT: grossly normal hearing, lips & tongue Neck: no LAD, masses or thyromegaly Cardiovascular: RRR, no m/r/g. +LE edema. Telemetry: SR, no arrhythmias  Respiratory:Normal respiratory effort. ++ronchi and wheeze Abdomen: soft, ntnd obese Skin: no rash or induration seen on limited exam Musculoskeletal: grossly normal tone BUE/BLE Psychiatric: grossly normal mood and affect, speech fluent and appropriate Neurologic: grossly non-focal.          Labs on Admission:  Basic Metabolic Panel:  Recent Labs Lab 10/26/14 1957 10/31/14 1535  NA 138 139  K 3.5 4.0  CL 101 105  CO2 29 27  GLUCOSE 226* 211*  BUN 11 12  CREATININE 0.82 0.84  CALCIUM 8.5 8.8   Liver Function Tests:  Recent Labs Lab 10/26/14 1957  AST 21  ALT 20  ALKPHOS 120*  BILITOT 0.4  PROT 7.3  ALBUMIN 3.5   No results for input(s): LIPASE, AMYLASE in the last 168 hours. No results for input(s): AMMONIA in the last 168 hours. CBC:  Recent Labs Lab 10/31/14 1535  WBC 7.2  HGB 12.3*  HCT 39.8  MCV 86.1  PLT 430*   Cardiac Enzymes:  Recent Labs Lab 10/26/14 1957 10/31/14 1602  TROPONINI 0.03 0.06*    BNP (last 3 results) No results for input(s): PROBNP in the last 8760 hours. CBG: No results for input(s): GLUCAP in the last 168 hours.  Radiological Exams on Admission: Dg Chest 2 View  10/31/2014   CLINICAL DATA:  Dyspnea.  EXAM: CHEST  2 VIEW  COMPARISON:  October 26, 2014.  FINDINGS: Stable cardiomegaly with central pulmonary vascular congestion. Stable interstitial densities are noted throughout both lungs which may represent scarring, but superimposed pulmonary edema cannot be excluded. No pneumothorax is noted. Minimal bilateral  pleural effusions are noted. Bony thorax is intact.  IMPRESSION: Stable cardiomegaly with stable central pulmonary vascular congestion. Stable interstitial densities are noted throughout both lungs which may represent scarring, but superimposed pulmonary edema cannot be excluded.   Electronically Signed   By: Sabino Dick M.D.   On: 10/31/2014 16:36      Assessment/Plan Active Problems:   Chest pain   Hypertension   Diabetes mellitus type 2 in obese   Hyperlipidemia associated with type 2 diabetes mellitus   Smoker   1. Chest Pain -patient will be admitted for serial enzymes -will place on telemetry -will get an echo in am -will need cardiology consult  2. Hypertension -monitor pressures -continue with home medications  3. Diabetes Mellitus Type 2 -placed on Insulin coverage -  will check FSBS -continue with oral hypoglycemic agents  4. Hyperlipidemia -will continue with statins -monitor labs  5. Smoker -smoking cessation counseling provided  6. Probable COPD -will continue with inhalers -smoking cessation -consider steroids -will check theophylline level   Code Status: Full Code (must indicate code status--if unknown or must be presumed, indicate so) DVT Prophylaxis:Heparin Family Communication: Wife (indicate person spoken with, if applicable, with phone number if by telephone) Disposition Plan: Home (indicate anticipated LOS)  Time spent: 65mn  Angeliah Wisdom A [facility] Pager [phone]

## 2014-10-31 NOTE — ED Notes (Signed)
Pt ambulates well with assistance from cane brought from home.

## 2014-10-31 NOTE — ED Notes (Signed)
[place] made aware of delay, while phlebotomy obtains Troponin.

## 2014-10-31 NOTE — ED Notes (Signed)
Bed: WA17 Expected date:  Expected time:  Means of arrival:  Comments: HOLD for triage one

## 2014-10-31 NOTE — ED Notes (Signed)
Per pt, states he was here on the 24 th-diagnosed with PNA-went home but states he is still having symptoms-saw PCP today and had chest xray and they said it was worse

## 2014-11-01 ENCOUNTER — Encounter (HOSPITAL_COMMUNITY): Payer: Self-pay

## 2014-11-01 DIAGNOSIS — J449 Chronic obstructive pulmonary disease, unspecified: Secondary | ICD-10-CM | POA: Diagnosis not present

## 2014-11-01 DIAGNOSIS — R7989 Other specified abnormal findings of blood chemistry: Secondary | ICD-10-CM

## 2014-11-01 DIAGNOSIS — E1165 Type 2 diabetes mellitus with hyperglycemia: Secondary | ICD-10-CM | POA: Diagnosis not present

## 2014-11-01 DIAGNOSIS — Z720 Tobacco use: Secondary | ICD-10-CM | POA: Diagnosis not present

## 2014-11-01 DIAGNOSIS — E785 Hyperlipidemia, unspecified: Secondary | ICD-10-CM | POA: Diagnosis not present

## 2014-11-01 DIAGNOSIS — E119 Type 2 diabetes mellitus without complications: Secondary | ICD-10-CM | POA: Diagnosis not present

## 2014-11-01 DIAGNOSIS — R079 Chest pain, unspecified: Secondary | ICD-10-CM | POA: Diagnosis not present

## 2014-11-01 DIAGNOSIS — I059 Rheumatic mitral valve disease, unspecified: Secondary | ICD-10-CM

## 2014-11-01 DIAGNOSIS — J441 Chronic obstructive pulmonary disease with (acute) exacerbation: Secondary | ICD-10-CM | POA: Diagnosis present

## 2014-11-01 DIAGNOSIS — I10 Essential (primary) hypertension: Secondary | ICD-10-CM | POA: Diagnosis not present

## 2014-11-01 DIAGNOSIS — E669 Obesity, unspecified: Secondary | ICD-10-CM | POA: Diagnosis not present

## 2014-11-01 DIAGNOSIS — R778 Other specified abnormalities of plasma proteins: Secondary | ICD-10-CM | POA: Diagnosis present

## 2014-11-01 DIAGNOSIS — I5021 Acute systolic (congestive) heart failure: Secondary | ICD-10-CM | POA: Diagnosis not present

## 2014-11-01 DIAGNOSIS — E1169 Type 2 diabetes mellitus with other specified complication: Secondary | ICD-10-CM | POA: Diagnosis not present

## 2014-11-01 LAB — GLUCOSE, CAPILLARY
GLUCOSE-CAPILLARY: 144 mg/dL — AB (ref 70–99)
GLUCOSE-CAPILLARY: 189 mg/dL — AB (ref 70–99)
Glucose-Capillary: 160 mg/dL — ABNORMAL HIGH (ref 70–99)
Glucose-Capillary: 162 mg/dL — ABNORMAL HIGH (ref 70–99)
Glucose-Capillary: 243 mg/dL — ABNORMAL HIGH (ref 70–99)

## 2014-11-01 LAB — LIPID PANEL
CHOL/HDL RATIO: 3.1 ratio
CHOLESTEROL: 147 mg/dL (ref 0–200)
HDL: 48 mg/dL (ref >39)
LDL Cholesterol: 72 mg/dL (ref 0–99)
TRIGLYCERIDES: 134 mg/dL (ref <150)
VLDL: 27 mg/dL (ref 0–40)

## 2014-11-01 LAB — BRAIN NATRIURETIC PEPTIDE: B NATRIURETIC PEPTIDE 5: 563.9 pg/mL — AB (ref 0.0–100.0)

## 2014-11-01 LAB — CREATININE, SERUM
Creatinine, Ser: 0.81 mg/dL (ref 0.50–1.35)
GFR calc Af Amer: >90 mL/min (ref >90)
GFR calc non Af Amer: >90 mL/min (ref >90)

## 2014-11-01 LAB — HEMOGLOBIN A1C
HEMOGLOBIN A1C: 10.1 % — AB (ref <5.7)
Mean Plasma Glucose: 243 mg/dL — ABNORMAL HIGH (ref <117)

## 2014-11-01 LAB — TROPONIN I
TROPONIN I: 0.05 ng/mL — AB (ref <0.031)
TROPONIN I: 0.05 ng/mL — AB (ref <0.031)
Troponin I: 0.06 ng/mL — ABNORMAL HIGH (ref <0.031)

## 2014-11-01 MED ORDER — FUROSEMIDE 10 MG/ML IJ SOLN
40.0000 mg | Freq: Once | INTRAMUSCULAR | Status: AC
  Administered 2014-11-01: 40 mg via INTRAVENOUS
  Filled 2014-11-01: qty 4

## 2014-11-01 MED ORDER — PREDNISONE (PAK) 10 MG PO TABS
10.0000 mg | ORAL_TABLET | ORAL | Status: AC
Start: 2014-11-01 — End: 2014-11-01
  Administered 2014-11-01: 10 mg via ORAL

## 2014-11-01 MED ORDER — PREDNISONE (PAK) 10 MG PO TABS: 10.0000 mg | ORAL_TABLET | ORAL | Status: DC

## 2014-11-01 MED ORDER — ASPIRIN 81 MG PO CHEW
81.0000 mg | CHEWABLE_TABLET | Freq: Every day | ORAL | Status: DC
  Administered 2014-11-01 – 2014-11-05 (×5): 81 mg via ORAL
  Filled 2014-11-01 (×5): qty 1

## 2014-11-01 MED ORDER — METOPROLOL TARTRATE 25 MG PO TABS
25.0000 mg | ORAL_TABLET | Freq: Three times a day (TID) | ORAL | Status: DC
  Administered 2014-11-01 – 2014-11-03 (×5): 25 mg via ORAL
  Filled 2014-11-01 (×5): qty 1

## 2014-11-01 MED ORDER — PREDNISONE (PAK) 10 MG PO TABS: 20.0000 mg | ORAL_TABLET | Freq: Every evening | ORAL | Status: DC

## 2014-11-01 MED ORDER — PREDNISONE (PAK) 10 MG PO TABS
10.0000 mg | ORAL_TABLET | Freq: Three times a day (TID) | ORAL | Status: DC
  Administered 2014-11-02: 10 mg via ORAL

## 2014-11-01 MED ORDER — PREDNISONE (PAK) 10 MG PO TABS
20.0000 mg | ORAL_TABLET | Freq: Every evening | ORAL | Status: AC
  Administered 2014-11-01: 20 mg via ORAL

## 2014-11-01 MED ORDER — PREDNISONE (PAK) 10 MG PO TABS: 10.0000 mg | ORAL_TABLET | Freq: Four times a day (QID) | ORAL | Status: DC

## 2014-11-01 NOTE — Progress Notes (Signed)
Progress Note   Nicholas Caldwell GGE:[id number] DOB: April 15, 1946 DOA: 10/31/2014 PCP: PROVIDER NOT IN SYSTEM   Brief Narrative:   Nicholas Caldwell is an 68 y.o. male with multiple cardiac risk factors including a history of hypertension, diabetes, obesity, and hyperlipidemia as well as a history of tobacco abuse was admitted 10/31/14 the one month history of intermittent chest pain. Troponins have been mildly elevated through the night.  Assessment/Plan:   Principal Problem:   Chest pain with elevated troponin I level -Continue aspirin and statin therapy. Add beta blocker when medically stable. -Cardiology consultation requested.  Active Problems:   Dyspnea with probable CHF -Chest x-ray shows findings consistent with pulmonary edema. -Check BNP level. -2-D echocardiogram ordered. -Give an additional dose of Lasix now.    Hypertension -Currently controlled. Continue Avapro/HCTZ.    Diabetes mellitus type 2 in obese -Patient has been refusing insulin. -Diabetes coordinator consultation requested. -Continue Glucotrol and metformin.    Hyperlipidemia associated with type 2 diabetes mellitus -Lipids currently controlled on Crestor.    Smoker -Tobacco cessation counseling.    COPD -Continue Spiriva and theophylline.  On a Prednisone taper.    DVT Prophylaxis - Continue Heparin.  Code Status: Full. Family Communication: No family at the bedside. Disposition Plan: Home when stable.   IV Access:    Peripheral IV   Procedures and diagnostic studies:   Dg Chest 2 View 10/31/2014: Stable cardiomegaly with stable central pulmonary vascular congestion. Stable interstitial densities are noted throughout both lungs which may represent scarring, but superimposed pulmonary edema cannot be excluded.    Medical Consultants:    Cardiology  Anti-Infectives:    None.  Subjective:   Nicholas Caldwell denies current chest pain.  He tells me he checks his sugars once a day and  that they have been running in the 100s.  Still feels short of breath, and does endorse a 2 month history of dyspnea including PND.  No fever.    Objective:    Filed Vitals:   11/01/14 0008 11/01/14 0431 11/01/14 1024 11/01/14 1350  BP: 148/96 117/86 153/108 151/88  Pulse: 66 99 97 135  Temp: 98.6 F (37 C) 98.3 F (36.8 C) 98 F (36.7 C) 97.6 F (36.4 C)  TempSrc: Oral Oral Oral Oral  Resp: _0 Height: _1  (1.905 m)     Weight: 122.426 kg (269 lb 14.4 oz)     SpO2: 96% 98% 98% 95%    Intake/Output Summary (Last 24 hours) at 11/01/14 1449 Last data filed at 11/01/14 1330  Gross per 24 hour  Intake    840 ml  Output      0 ml  Net    840 ml    Exam: Gen:  NAD Cardiovascular:  RRR, No M/R/G Respiratory:  Lungs CTAB Gastrointestinal:  Abdomen soft, NT/ND, + BS Extremities:  !+ edema   Data Reviewed:    Labs: Basic Metabolic Panel:  Recent Labs Lab 10/26/14 1957 10/31/14 1535 10/31/14 2345  NA 138 139  --   K 3.5 4.0  --   CL 101 105  --   CO2 29 27  --   GLUCOSE 226* 211*  --   BUN 11 12  --   CREATININE 0.82 0.84 0.81  CALCIUM 8.5 8.8  --    GFR Estimated Creatinine Clearance: 126.5 mL/min (by C-G formula based on Cr of 0.81). Liver Function Tests:  Recent Labs Lab 10/26/14 1957  AST 21  ALT  20  ALKPHOS 120*  BILITOT 0.4  PROT 7.3  ALBUMIN 3.5   CBC:  Recent Labs Lab 10/31/14 1535 10/31/14 2345  WBC 7.2 7.4  HGB 12.3* 12.7*  HCT 39.8 39.6  MCV 86.1 85.5  PLT 430* 394   Cardiac Enzymes:  Recent Labs Lab 10/31/14 1602 10/31/14 2050 10/31/14 2345 11/01/14 0216 11/01/14 0540  TROPONINI 0.06* 0.05* 0.06* 0.05* 0.05*   BNP (last 3 results) No results for input(s): PROBNP in the last 8760 hours. CBG:  Recent Labs Lab 11/01/14 0012 11/01/14 0728 11/01/14 1151  GLUCAP 162* 144* 160*   D-Dimer:  Recent Labs  10/31/14 1602  DDIMER 0.42   Hgb A1c:  Recent Labs  10/31/14 2345  HGBA1C 10.1*   Lipid  Profile:  Recent Labs  11/01/14 0216  CHOL 147  HDL 48  LDLCALC 72  TRIG 134  CHOLHDL 3.1   Microbiology No results found for this or any previous visit (from the past 240 hour(s)).   Medications:   . aspirin  81 mg Oral Daily  . glipiZIDE  20 mg Oral BID AC  . heparin  5,000 Units Subcutaneous Q8H  . irbesartan  150 mg Oral Daily   And  . hydrochlorothiazide  12.5 mg Oral Daily  . insulin aspart  0-15 Units Subcutaneous TID WC  . metFORMIN  1,000 mg Oral BID WC  . methadone  10 mg Oral Q12H  . predniSONE  10 mg Oral PC supper  . [START ON 11/02/2014] predniSONE  10 mg Oral 3 x daily with food  . [START ON 11/03/2014] predniSONE  10 mg Oral 4X daily taper  . predniSONE  20 mg Oral Nightly  . [START ON 11/02/2014] predniSONE  20 mg Oral Nightly  . rosuvastatin  20 mg Oral Daily  . theophylline  200 mg Oral Daily  . tiotropium  18 mcg Inhalation Daily  . [START ON 11/05/2014] Vitamin D (Ergocalciferol)  50,000 Units Oral Q7 days   Continuous Infusions:   Time spent: 35 minutes.  The patient is medically complex and requires high complexity decision making.    LOS: 1 day   RAMA,CHRISTINA  [facility] Pager [phone]. If unable to reach me by pager, please call my cell phone at [phone].  *Please refer to [email or web address], password [email or web address] to get updated schedule on who will round on this patient, as hospitalists switch teams weekly. If 7PM-7AM, please contact night-coverage at [email or web address], password [email or web address] for any overnight needs.  11/01/2014, 2:49 PM

## 2014-11-01 NOTE — Consult Note (Signed)
Cardiology Consultation  Nicholas Caldwell    [id number] [birth year]  Reason for Consult: Shortness of breath, mildly positive troponin  Requesting Physician: Dr. Early Chars  Primary Cardiologist:   HPI: Nicholas Caldwell is a 68 year old African-American male who admits to a several month history of increasing shortness of breath.  Remotely, the patient states a proximally for 5 years ago.  He had undergone an ultrasound and a nuclear stress test.  I could not find these results.  He was told that his heart was enlarged.  Recently, he has noticed shortness of breath and was given antibiotics without significant benefit.  He also has noticed some intermittent congestion and cough.  He presented yesterday to the emergency room with increasing shortness of breath and vague chest pain.  He was admitted by Dr. Chancy Milroy.  His troponin markers have been mildly positive at 0.05 and 0.06.  His ECG has shown mild T wave abnormalities.  The patient has a 45+ year history of tobacco use and also has a history of diabetes mellitus, currently not well controlled with an elevated hemoglobin A1c.  Cardiology consultation is requested for further evaluation.   Past Medical History  Diagnosis Date  . Hypertension   . Diabetes mellitus without complication   . Hypercholesteremia    History reviewed. No pertinent past surgical history.  FAMHx: Family history is notable in that both parents are deceased.  He believes they both had heart disease.  SOCHx:  reports that he has been smoking Cigarettes.  He has been smoking about 0.05 packs per day. He has never used smokeless tobacco. He reports that he does not drink alcohol or use illicit drugs.  He is married.  He has one child and one grandchild.  He started smoking at age 89.  He currently has been trying to use Nicorette gum while in the hospital.  ALLERGIES: No Known Allergies  ROS:  General: Negative; No fevers, chills, or night sweats;  HEENT: Negative;  No changes in vision or hearing, sinus congestion, difficulty swallowing Pulmonary: positive for cough, no wheezing, hemoptysis Cardiovascular:  See HPI GI: Negative; No nausea, vomiting, diarrhea, or abdominal pain GU: Negative; No dysuria, hematuria, or difficulty voiding Musculoskeletal: Negative; no myalgias, joint pain, or weakness Hematologic/Oncology: Negative; no easy bruising, bleeding Endocrine: Positive for DM; no heat/cold intolerance; Neuro: Negative; no changes in balance, headaches Skin: Negative; No rashes or skin lesions Psychiatric: Negative; No behavioral problems, depression Sleep: Positive for snoring, no daytime sleepiness, hypersomnolence, bruxism, restless legs, hypnogognic hallucinations, no cataplexy Other comprehensive 14 point system review is negative.  HOME MEDICATIONS: Prescriptions prior to admission  Medication Sig Dispense Refill Last Dose  . glipiZIDE (GLUCOTROL) 10 MG tablet Take 20 mg by mouth 2 (two) times daily before a meal.   10/30/2014 at Unknown time  . metFORMIN (GLUCOPHAGE) 1000 MG tablet Take 1,000 mg by mouth 2 (two) times daily with a meal.   10/30/2014 at Unknown time  . methadone (DOLOPHINE) 10 MG tablet Take 10 mg by mouth every 12 (twelve) hours.   10/30/2014 at Unknown time  . Olmesartan-Amlodipine-HCTZ (TRIBENZOR PO) Take 1 tablet by mouth daily.   Past Week at Unknown time  . oxyCODONE-acetaminophen (PERCOCET) 10-325 MG per tablet Take 1 tablet by mouth every 6 (six) hours as needed for pain (pain).    10/30/2014 at Unknown time  . oxyCODONE-acetaminophen (PERCOCET/ROXICET) 5-325 MG per tablet Take 1 tablet by mouth every 6 (six) hours as needed for moderate pain or severe pain (pain).  10/30/2014 at Unknown time  . rosuvastatin (CRESTOR) 20 MG tablet Take 20 mg by mouth daily.   10/30/2014 at Unknown time  . theophylline (UNIPHYL) 400 MG 24 hr tablet Take 200 mg by mouth daily.   10/30/2014 at Unknown time  . benzonatate (TESSALON)  100 MG capsule Take 1 capsule (100 mg total) by mouth every 8 (eight) hours. 21 capsule 0   . celecoxib (CELEBREX) 200 MG capsule Take 200 mg by mouth daily as needed for mild pain or moderate pain.   Past Week at Unknown time  . levofloxacin (LEVAQUIN) 250 MG tablet Take 3 tablets (750 mg total) by mouth daily. (Patient not taking: Reported on 10/31/2014) 12 tablet 0 Completed Course at Unknown time  . Vitamin D, Ergocalciferol, (DRISDOL) 50000 UNITS CAPS capsule Take 50,000 Units by mouth every 7 (seven) days. On Sunday.   10/29/2014 at unknown time    HOSPITAL MEDICATIONS: . aspirin  81 mg Oral Daily  . furosemide  40 mg Intravenous Once  . glipiZIDE  20 mg Oral BID AC  . heparin  5,000 Units Subcutaneous Q8H  . irbesartan  150 mg Oral Daily   And  . hydrochlorothiazide  12.5 mg Oral Daily  . insulin aspart  0-15 Units Subcutaneous TID WC  . metFORMIN  1,000 mg Oral BID WC  . methadone  10 mg Oral Q12H  . predniSONE  10 mg Oral PC lunch  . predniSONE  10 mg Oral PC supper  . predniSONE  10 mg Oral 3 x daily with food  . [START ON 11/02/2014] predniSONE  10 mg Oral 4X daily taper  . predniSONE  20 mg Oral Nightly  . predniSONE  20 mg Oral Nightly  . rosuvastatin  20 mg Oral Daily  . theophylline  200 mg Oral Daily  . tiotropium  18 mcg Inhalation Daily  . [START ON 11/05/2014] Vitamin D (Ergocalciferol)  50,000 Units Oral Q7 days    VITALS: Blood pressure 153/108, pulse 97, temperature 98 F (36.7 C), temperature source Oral, resp. rate 20, height 6' 3" (1.905 m), weight 269 lb 14.4 oz (122.426 kg), SpO2 98 %.  PHYSICAL EXAM: General appearance: alert, cooperative and no distress; moderate obesity Neck: no adenopathy, no carotid bruit, no JVD, supple, symmetrical, trachea midline and thyroid not enlarged, symmetric, no tenderness/mass/nodules Lungs: Decreased breath sounds without audible wheezes Chest wall: Nontender to palpation Heart: regular rate and rhythm; 1/6 systolic  murmur Abdomen: Moderate central adiposity; soft, non-tender; bowel sounds normal; no masses,  no organomegaly Back: No CVA tenderness Extremities: Trace pretibial edema bilaterally Pulses: 2+ and symmetric Skin: Skin color, texture, turgor normal. No rashes or lesions Neurologic: Alert and oriented X 3, normal strength and tone. Normal symmetric reflexes. Normal coordination and gait  Psychological: Normal affect and mood  ECG (independently read by me):  NSR at 98, nonspecific anterior T changes, increased QTc  LABS: Results for orders placed or performed during the hospital encounter of 10/31/14 (from the past 48 hour(s))  Basic metabolic panel     Status: Abnormal   Collection Time: 10/31/14  3:35 PM  Result Value Ref Range   Sodium 139 135 - 145 mmol/L    Comment: Please note change in reference range.   Potassium 4.0 3.5 - 5.1 mmol/L    Comment: Please note change in reference range.   Chloride 105 96 - 112 mEq/L   CO2 27 19 - 32 mmol/L   Glucose, Bld 211 (H) 70 - 99 mg/dL  BUN 12 6 - 23 mg/dL   Creatinine, Ser 0.84 0.50 - 1.35 mg/dL   Calcium 8.8 8.4 - 10.5 mg/dL   GFR calc non Af Amer 89 (L) >90 mL/min   GFR calc Af Amer >90 >90 mL/min    Comment: (NOTE) The eGFR has been calculated using the CKD EPI equation. This calculation has not been validated in all clinical situations. eGFR's persistently <90 mL/min signify possible Chronic Kidney Disease.    Anion gap 7 5 - 15  CBC     Status: Abnormal   Collection Time: 10/31/14  3:35 PM  Result Value Ref Range   WBC 7.2 4.0 - 10.5 K/uL   RBC 4.62 4.22 - 5.81 MIL/uL   Hemoglobin 12.3 (L) 13.0 - 17.0 g/dL   HCT 39.8 39.0 - 52.0 %   MCV 86.1 78.0 - 100.0 fL   MCH 26.6 26.0 - 34.0 pg   MCHC 30.9 30.0 - 36.0 g/dL   RDW 14.8 11.5 - 15.5 %   Platelets 430 (H) 150 - 400 K/uL  D-dimer, quantitative     Status: None   Collection Time: 10/31/14  4:02 PM  Result Value Ref Range   D-Dimer, Quant 0.42 0.00 - 0.48 ug/mL-FEU     Comment:        AT THE INHOUSE ESTABLISHED CUTOFF VALUE OF 0.48 ug/mL FEU, THIS ASSAY HAS BEEN DOCUMENTED IN THE LITERATURE TO HAVE A SENSITIVITY AND NEGATIVE PREDICTIVE VALUE OF AT LEAST 98 TO 99%.  THE TEST RESULT SHOULD BE CORRELATED WITH AN ASSESSMENT OF THE CLINICAL PROBABILITY OF DVT / VTE.   Troponin I     Status: Abnormal   Collection Time: 10/31/14  4:02 PM  Result Value Ref Range   Troponin I 0.06 (H) <0.031 ng/mL    Comment:        PERSISTENTLY INCREASED TROPONIN VALUES IN THE RANGE OF 0.04-0.49 ng/mL CAN BE SEEN IN:       -UNSTABLE ANGINA       -CONGESTIVE HEART FAILURE       -MYOCARDITIS       -CHEST TRAUMA       -ARRYHTHMIAS       -LATE PRESENTING MYOCARDIAL INFARCTION       -COPD   CLINICAL FOLLOW-UP RECOMMENDED. Please note change in reference range.   Troponin I     Status: Abnormal   Collection Time: 10/31/14  8:50 PM  Result Value Ref Range   Troponin I 0.05 (H) <0.031 ng/mL    Comment:        PERSISTENTLY INCREASED TROPONIN VALUES IN THE RANGE OF 0.04-0.49 ng/mL CAN BE SEEN IN:       -UNSTABLE ANGINA       -CONGESTIVE HEART FAILURE       -MYOCARDITIS       -CHEST TRAUMA       -ARRYHTHMIAS       -LATE PRESENTING MYOCARDIAL INFARCTION       -COPD   CLINICAL FOLLOW-UP RECOMMENDED. Please note change in reference range.   Troponin I-serum (0, 3, 6 hours)     Status: Abnormal   Collection Time: 10/31/14 11:45 PM  Result Value Ref Range   Troponin I 0.06 (H) <0.031 ng/mL    Comment:        PERSISTENTLY INCREASED TROPONIN VALUES IN THE RANGE OF 0.04-0.49 ng/mL CAN BE SEEN IN:       -UNSTABLE ANGINA       -CONGESTIVE HEART FAILURE       -  MYOCARDITIS       -CHEST TRAUMA       -ARRYHTHMIAS       -LATE PRESENTING MYOCARDIAL INFARCTION       -COPD   CLINICAL FOLLOW-UP RECOMMENDED. Please note change in reference range.   CBC     Status: Abnormal   Collection Time: 10/31/14 11:45 PM  Result Value Ref Range   WBC 7.4 4.0 - 10.5 K/uL   RBC  4.63 4.22 - 5.81 MIL/uL   Hemoglobin 12.7 (L) 13.0 - 17.0 g/dL   HCT 39.6 39.0 - 52.0 %   MCV 85.5 78.0 - 100.0 fL   MCH 27.4 26.0 - 34.0 pg   MCHC 32.1 30.0 - 36.0 g/dL   RDW 14.9 11.5 - 15.5 %   Platelets 394 150 - 400 K/uL  Creatinine, serum     Status: None   Collection Time: 10/31/14 11:45 PM  Result Value Ref Range   Creatinine, Ser 0.81 0.50 - 1.35 mg/dL   GFR calc non Af Amer >90 >90 mL/min   GFR calc Af Amer >90 >90 mL/min    Comment: (NOTE) The eGFR has been calculated using the CKD EPI equation. This calculation has not been validated in all clinical situations. eGFR's persistently <90 mL/min signify possible Chronic Kidney Disease.   Hemoglobin A1c     Status: Abnormal   Collection Time: 10/31/14 11:45 PM  Result Value Ref Range   Hgb A1c MFr Bld 10.1 (H) <5.7 %    Comment: (NOTE)                                                                       According to the ADA Clinical Practice Recommendations for 2011, when HbA1c is used as a screening test:  >=6.5%   Diagnostic of Diabetes Mellitus           (if abnormal result is confirmed) 5.7-6.4%   Increased risk of developing Diabetes Mellitus References:Diagnosis and Classification of Diabetes Mellitus,Diabetes YTKZ,6010,93(ATFTD 1):S62-S69 and Standards of Medical Care in         Diabetes - 2011,Diabetes DUKG,2542,70 (Suppl 1):S11-S61.    Mean Plasma Glucose 243 (H) <117 mg/dL    Comment: Performed at [facility]  Glucose, capillary     Status: Abnormal   Collection Time: 11/01/14 12:12 AM  Result Value Ref Range   Glucose-Capillary 162 (H) 70 - 99 mg/dL  Troponin I-serum (0, 3, 6 hours)     Status: Abnormal   Collection Time: 11/01/14  2:16 AM  Result Value Ref Range   Troponin I 0.05 (H) <0.031 ng/mL    Comment:        PERSISTENTLY INCREASED TROPONIN VALUES IN THE RANGE OF 0.04-0.49 ng/mL CAN BE SEEN IN:       -UNSTABLE ANGINA       -CONGESTIVE HEART FAILURE       -MYOCARDITIS       -CHEST  TRAUMA       -ARRYHTHMIAS       -LATE PRESENTING MYOCARDIAL INFARCTION       -COPD   CLINICAL FOLLOW-UP RECOMMENDED. Please note change in reference range.   Lipid panel     Status: None   Collection Time: 11/01/14  2:16 AM  Result Value Ref Range   Cholesterol 147 0 - 200 mg/dL   Triglycerides 134 <150 mg/dL   HDL 48 >39 mg/dL   Total CHOL/HDL Ratio 3.1 RATIO   VLDL 27 0 - 40 mg/dL   LDL Cholesterol 72 0 - 99 mg/dL    Comment:        Total Cholesterol/HDL:CHD Risk Coronary Heart Disease Risk Table                     Men   Women  1/2 Average Risk   3.4   3.3  Average Risk       5.0   4.4  2 X Average Risk   9.6   7.1  3 X Average Risk  23.4   11.0        Use the calculated Patient Ratio above and the CHD Risk Table to determine the patient's CHD Risk.        ATP III CLASSIFICATION (LDL):  <100     mg/dL   Optimal  100-129  mg/dL   Near or Above                    Optimal  130-159  mg/dL   Borderline  160-189  mg/dL   High  >190     mg/dL   Very High Performed at [facility]   Troponin I-serum (0, 3, 6 hours)     Status: Abnormal   Collection Time: 11/01/14  5:40 AM  Result Value Ref Range   Troponin I 0.05 (H) <0.031 ng/mL    Comment:        PERSISTENTLY INCREASED TROPONIN VALUES IN THE RANGE OF 0.04-0.49 ng/mL CAN BE SEEN IN:       -UNSTABLE ANGINA       -CONGESTIVE HEART FAILURE       -MYOCARDITIS       -CHEST TRAUMA       -ARRYHTHMIAS       -LATE PRESENTING MYOCARDIAL INFARCTION       -COPD   CLINICAL FOLLOW-UP RECOMMENDED. Please note change in reference range.    BNP (last 3 results) No results for input(s): PROBNP in the last 8760 hours.    IMAGING: Dg Chest 2 View  10/31/2014   CLINICAL DATA:  Dyspnea.  EXAM: CHEST  2 VIEW  COMPARISON:  October 26, 2014.  FINDINGS: Stable cardiomegaly with central pulmonary vascular congestion. Stable interstitial densities are noted throughout both lungs which may represent scarring, but superimposed  pulmonary edema cannot be excluded. No pneumothorax is noted. Minimal bilateral pleural effusions are noted. Bony thorax is intact.  IMPRESSION: Stable cardiomegaly with stable central pulmonary vascular congestion. Stable interstitial densities are noted throughout both lungs which may represent scarring, but superimposed pulmonary edema cannot be excluded.   Electronically Signed   By: Sabino Dick M.D.   On: 10/31/2014 16:36    IMPRESSION/PLAN:  1.  Mild CHF: Suspect at least diastolic dysfunction.  We'll obtain a 2-D echo Doppler study today to assess systolic and diastolic function as well as valvular architecture.  Will schedule for a BNP.  2.  Chest pain/exertional dyspnea: Rule out ischemia mediated.  Will schedule patient for a pharmacologic Lexiscan Myoview study tomorrow for risk stratification.  3.  Mildly positive troponin .  We'll obtain serial markers for further assessment.  Question if related to CHF.  4.  Type 2 diabetes mellitus: Poorly controlled.  Hemoglobin A1c on admission 10.1.  5.  Long-standing tobacco use proximally 48 years.  Smoking cessation counseled.  6.  Essential Hypertension: On ARB therapy with irbesartan and diuretic therapy with Lasix at home.  Target blood pressure less than 130/80 in this diabetic male.  7.  Hyperlipidemia: Currently on Crestor 20 mg.  Recent lipid panel reviewed.  LDL at 72  8.  Mild obesity with a BMI of 33.8  9.  Probable COPD on bronchodilator therapy and chronic theophylline.  Consider possibility of discontinuance of theophylline.   Attending:  Troy Sine, MD, [facility] 11/01/2014 12:07 PM

## 2014-11-01 NOTE — Progress Notes (Signed)
Inpatient Diabetes Program Recommendations  AACE/ADA: New Consensus Statement on Inpatient Glycemic Control (2013)  Target Ranges:  Prepandial:   less than 140 mg/dL      Peak postprandial:   less than 180 mg/dL (1-2 hours)      Critically ill patients:  140 - 180 mg/dL     Results for Nicholas Caldwell, Nicholas Caldwell (MRN [id number]) as of 11/01/2014 14:58  Ref. Range 11/01/2014 07:28 11/01/2014 11:51  Glucose-Capillary Latest Range: 70-99 mg/dL 144 (H) 160 (H)    Results for Nicholas Caldwell, Nicholas Caldwell (MRN [id number]) as of 11/01/2014 14:58  Ref. Range 10/31/2014 23:45  Hgb A1c MFr Bld Latest Range: <5.7 % 10.1 (H)     68 y.o. male with multiple cardiac risk factors including a history of hypertension, diabetes, obesity, and hyperlipidemia as well as a history of tobacco abuse.  Admitted 10/31/14 the one month history of intermittent chest pain. Troponins have been mildly elevated through the night.  Home DM Meds: Glipizide 20 mg bid       Metformin 1000 mg bid   Current Orders: Glipizide 20 mg bid      Metformin 1000 mg bid      Novolog Moderate SSI   **Received referral for this patient from Dr. Rockne Menghini.  Reason= "Uncontrolled DM".  Patient has also been refusing insulin.  **Attempted to provide a thorough assessment with this patient in regards to his DM care at home.  Asked patient many questions to assess his DM care including: 1. How long have you had DM?  2) What medications have you taken for your DM?  3) Who is your PCP and when is the last time you saw your PCP?  4) Do you know what an A1c is?  5) Do you check your CBGs at home?  If so, what are your blood sugar numbers?  Patient told me he has had DM for 8 years and has always taken oral DM medications.  Patient stated to me that he now sees Dr. Lyman Bishop with the [facility].  Used to see Dr. Katherine Roan, who according to the patient, gave patient samples of Farxiga for home use but patient has not been taking.  Patient went on to  tell me that he needs hip surgery but that the doctor won't do his surgery until his A1c is improved.  Attempted to discuss the importance of good blood sugar control and also the importance of more frequent blood sugar testing at home.  Asked patient if Dr. Rockne Menghini or his PCP had discussed the possibility of starting insulin to improve his glucose control at home.  Patient became extremely irritated and agitated with me and spoke to me in a very hostile manner.  Patient told me I was "asking too many questions" and that he needed to use the restroom.  Apologized to patient for not knowing he needed to use the restroom and told patient he could interrupt me at any time to use the restroom as needed.  I waited for patient to come back from the restroom and patient continued to be hostile towards me.  Patient told me he thought I was "interrogating" him and that I was being disrespectful and treating him like he was "on trial". Patient then accused me of "making him start insulin" and that he was angry that I (the DM Coordinator) knew of other DM medications but that I wasn't willing to discuss those medications with him and that all I wanted to do was start him  on insulin.  Attempted to de-escalate the situation and explain to patient that I was asking him questions b/c I was consulted by the physician in order to assist the physician in getting his (the patient's) blood sugars under control and that the physician and patient together are ultimately the decision makers on medications for home use.  I again apologized to patient that he felt like I was being disrespectful and that my only mission was to provide a thorough assessment of his DM regimen at home and to help the doctor with his blood sugar control.  Patient told me that I (the DM Coordinator) was talking to him like he was stupid and that I was asking him questions that were irrelevant to his care.  Patient stated "why you gotta ask me what doctor gave me  samples of Farxiga?!  I'm not stupid!  I'm not gonna go home and take a bunch of expired medications!"  At this point, I again apologized that he felt this way and that I would alert Dr. Rockne Menghini to our conversation and that I would let Dr. Rockne Menghini speak with him further about his DM care and that I would excuse myself from his room so as not to upset him further.  Spoke with patient's nurse about patient's belligerent behavior towards me and she was surprised to hear that patient talked to me in such a fashion as she (patient's nurse) was present for part of our conversation and stated to me that I was not disrespectful to the patient in any way.  **Will not attempt any more visits with this patient.  Patient does not wish to start insulin at home and is not willing to take insulin until other oral DM medications are attempted.   Signing off Wyn Quaker RN, MSN, CDE Diabetes Coordinator Inpatient Diabetes Program Team Pager: [phone] (8a-10p)

## 2014-11-01 NOTE — Progress Notes (Signed)
Echocardiogram 2D Echocardiogram has been performed.  Nicholas Caldwell 11/01/2014, 1:40 PM

## 2014-11-01 NOTE — Progress Notes (Signed)
UR completed.

## 2014-11-01 NOTE — Progress Notes (Signed)
NP informed of last 3 sets of elevated Troponin as ordered. SRP, RN

## 2014-11-01 NOTE — Progress Notes (Signed)
This NP just informed of 3 elevated troponins since admission. Troponins and chart reviewed. Pt came into ED with chest pain and SOB. After CXR, this was thought to be secondary to pulmonary congestion and ? Pulmonary edema. The pt had Lasix IV. EKG on admission without acute changes. Troponins only slightly elevated and last one is trending down. Pt is having NO chest pain now. Echo this am with cardio consult. Do not think this is ACS. Pt is on statin and will add baby aspirin daily given risk factors for CAD (DM II, HTN). Do not see a stress test/cardiac cath on chart. Lipid panel this am.  Clance Boll, NP

## 2014-11-02 ENCOUNTER — Encounter (HOSPITAL_COMMUNITY): Payer: Self-pay | Admitting: Internal Medicine

## 2014-11-02 ENCOUNTER — Other Ambulatory Visit: Payer: Self-pay

## 2014-11-02 ENCOUNTER — Observation Stay (HOSPITAL_COMMUNITY)
Admission: EM | Admit: 2014-11-02 | Discharge: 2014-11-02 | Disposition: A | Discharge status: Home / Self Care | Payer: Medicare Other | Source: Home / Self Care | Attending: Cardiovascular Disease | Admitting: Cardiovascular Disease

## 2014-11-02 ENCOUNTER — Ambulatory Visit (HOSPITAL_COMMUNITY)
Admission: EM | Admit: 2014-11-02 | Discharge: 2014-11-02 | Disposition: A | Discharge status: Home / Self Care | Payer: Medicare Other | Source: Home / Self Care | Attending: Cardiovascular Disease | Admitting: Cardiovascular Disease

## 2014-11-02 DIAGNOSIS — R0600 Dyspnea, unspecified: Secondary | ICD-10-CM | POA: Diagnosis not present

## 2014-11-02 DIAGNOSIS — R0789 Other chest pain: Secondary | ICD-10-CM | POA: Diagnosis not present

## 2014-11-02 DIAGNOSIS — E119 Type 2 diabetes mellitus without complications: Secondary | ICD-10-CM | POA: Diagnosis not present

## 2014-11-02 DIAGNOSIS — E1169 Type 2 diabetes mellitus with other specified complication: Secondary | ICD-10-CM | POA: Diagnosis not present

## 2014-11-02 DIAGNOSIS — R7989 Other specified abnormal findings of blood chemistry: Secondary | ICD-10-CM | POA: Diagnosis not present

## 2014-11-02 DIAGNOSIS — R0602 Shortness of breath: Secondary | ICD-10-CM | POA: Diagnosis present

## 2014-11-02 DIAGNOSIS — I5021 Acute systolic (congestive) heart failure: Principal | ICD-10-CM

## 2014-11-02 DIAGNOSIS — E669 Obesity, unspecified: Secondary | ICD-10-CM | POA: Diagnosis not present

## 2014-11-02 DIAGNOSIS — I5023 Acute on chronic systolic (congestive) heart failure: Secondary | ICD-10-CM | POA: Diagnosis present

## 2014-11-02 DIAGNOSIS — Z720 Tobacco use: Secondary | ICD-10-CM | POA: Diagnosis not present

## 2014-11-02 DIAGNOSIS — R079 Chest pain, unspecified: Secondary | ICD-10-CM | POA: Diagnosis not present

## 2014-11-02 DIAGNOSIS — I5043 Acute on chronic combined systolic (congestive) and diastolic (congestive) heart failure: Secondary | ICD-10-CM | POA: Diagnosis not present

## 2014-11-02 DIAGNOSIS — J441 Chronic obstructive pulmonary disease with (acute) exacerbation: Secondary | ICD-10-CM | POA: Diagnosis not present

## 2014-11-02 DIAGNOSIS — I10 Essential (primary) hypertension: Secondary | ICD-10-CM | POA: Diagnosis not present

## 2014-11-02 LAB — BASIC METABOLIC PANEL
ANION GAP: 9 (ref 5–15)
BUN: 17 mg/dL (ref 6–23)
CALCIUM: 9 mg/dL (ref 8.4–10.5)
CHLORIDE: 99 meq/L (ref 96–112)
CO2: 29 mmol/L (ref 19–32)
Creatinine, Ser: 0.86 mg/dL (ref 0.50–1.35)
GFR, EST NON AFRICAN AMERICAN: 88 mL/min — AB (ref >90)
Glucose, Bld: 219 mg/dL — ABNORMAL HIGH (ref 70–99)
Potassium: 4 mmol/L (ref 3.5–5.1)
Sodium: 137 mmol/L (ref 135–145)

## 2014-11-02 LAB — GLUCOSE, CAPILLARY: GLUCOSE-CAPILLARY: 223 mg/dL — AB (ref 70–99)

## 2014-11-02 MED ORDER — HYDROCHLOROTHIAZIDE 25 MG PO TABS
25.0000 mg | ORAL_TABLET | Freq: Every day | ORAL | Status: DC
Start: 2014-11-02 — End: 2014-11-07
  Administered 2014-11-02 – 2014-11-07 (×6): 25 mg via ORAL
  Filled 2014-11-02 (×6): qty 1

## 2014-11-02 MED ORDER — REGADENOSON 0.4 MG/5ML IV SOLN
INTRAVENOUS | Status: AC
  Filled 2014-11-02: qty 5

## 2014-11-02 MED ORDER — TECHNETIUM TC 99M SESTAMIBI GENERIC - CARDIOLITE
10.0000 | Freq: Once | INTRAVENOUS | Status: AC | PRN
  Administered 2014-11-02: 10 via INTRAVENOUS

## 2014-11-02 MED ORDER — IRBESARTAN 150 MG PO TABS
150.0000 mg | ORAL_TABLET | Freq: Two times a day (BID) | ORAL | Status: DC
  Administered 2014-11-02 – 2014-11-07 (×11): 150 mg via ORAL
  Filled 2014-11-02 (×12): qty 1

## 2014-11-02 MED ORDER — REGADENOSON 0.4 MG/5ML IV SOLN
0.4000 mg | Freq: Once | INTRAVENOUS | Status: AC
  Administered 2014-11-02: 0.4 mg via INTRAVENOUS
  Filled 2014-11-02: qty 5

## 2014-11-02 MED ORDER — TECHNETIUM TC 99M SESTAMIBI GENERIC - CARDIOLITE
30.0000 | Freq: Once | INTRAVENOUS | Status: AC | PRN
  Administered 2014-11-02: 30 via INTRAVENOUS

## 2014-11-02 MED ORDER — FUROSEMIDE 10 MG/ML IJ SOLN
40.0000 mg | Freq: Every day | INTRAMUSCULAR | Status: DC
  Administered 2014-11-02 – 2014-11-03 (×2): 40 mg via INTRAVENOUS
  Filled 2014-11-02 (×2): qty 4

## 2014-11-02 NOTE — Progress Notes (Addendum)
Subjective:  No chest pain  Objective:   Vital Signs in the last 24 hours: Temp:  [97.6 F (36.4 C)-98.9 F (37.2 C)] 98.9 F (37.2 C) (12/31 0548) Pulse Rate:  [80-135] 80 (12/31 0548) Resp:  [16-20] 16 (12/31 0548) BP: (109-153)/(61-108) 130/73 mmHg (12/31 0548) SpO2:  [92 %-98 %] 96 % (12/31 0548) Weight:  [261 lb 1.6 oz (118.434 kg)] 261 lb 1.6 oz (118.434 kg) (12/31 0548)  Intake/Output from previous day: 12/30 0701 - 12/31 0700 In: 960 [P.O.:960] Out: -  Net: +960  I/O since admission: +960  Medications: . aspirin  81 mg Oral Daily  . furosemide  40 mg Intravenous Daily  . glipiZIDE  20 mg Oral BID AC  . heparin  5,000 Units Subcutaneous Q8H  . irbesartan  150 mg Oral Daily   And  . hydrochlorothiazide  12.5 mg Oral Daily  . insulin aspart  0-15 Units Subcutaneous TID WC  . metFORMIN  1,000 mg Oral BID WC  . methadone  10 mg Oral Q12H  . metoprolol tartrate  25 mg Oral 3 times per day  . predniSONE  10 mg Oral 3 x daily with food  . [START ON 11/03/2014] predniSONE  10 mg Oral 4X daily taper  . predniSONE  20 mg Oral Nightly  . rosuvastatin  20 mg Oral Daily  . theophylline  200 mg Oral Daily  . tiotropium  18 mcg Inhalation Daily  . [START ON 11/05/2014] Vitamin D (Ergocalciferol)  50,000 Units Oral Q7 days       Physical Exam:  General appearance: alert, cooperative and no distress; moderate obesity Neck: no adenopathy, no carotid bruit, no JVD, supple, symmetrical, trachea midline and thyroid not enlarged, symmetric, no tenderness/mass/nodules Lungs: Decreased breath sounds without audible wheezes Chest wall: Nontender to palpation Heart: regular rate and rhythm; 1/6 systolic murmur Abdomen: Moderate central adiposity; soft, non-tender; bowel sounds normal; no masses, no organomegaly Back: No CVA tenderness Extremities: Trace pretibial edema bilaterally; not significantly changed Pulses: 2+ and symmetric Skin: Skin color, texture, turgor normal. No  rashes or lesions Neurologic: Alert and oriented X 3, normal strength and tone. Normal symmetric reflexes. Normal coordination and gait  Psychological: Normal affect and mood   Rate: 65  Rhythm: normal sinus rhythm  ECG (independently read by me): NSR at 98, LVH   Lab Results:  BMP Latest Ref Rng 11/02/2014 10/31/2014 10/31/2014  Glucose 70 - 99 mg/dL 219(H) - 211(H)  BUN 6 - 23 mg/dL 17 - 12  Creatinine 0.50 - 1.35 mg/dL 0.86 0.81 0.84  Sodium 135 - 145 mmol/L 137 - 139  Potassium 3.5 - 5.1 mmol/L 4.0 - 4.0  Chloride 96 - 112 mEq/L 99 - 105  CO2 19 - 32 mmol/L 29 - 27  Calcium 8.4 - 10.5 mg/dL 9.0 - 8.8     CBC Latest Ref Rng 10/31/2014 10/31/2014  WBC 4.0 - 10.5 K/uL 7.4 7.2  Hemoglobin 13.0 - 17.0 g/dL 12.7(L) 12.3(L)  Hematocrit 39.0 - 52.0 % 39.6 39.8  Platelets 150 - 400 K/uL 394 430(H)      Recent Labs  11/01/14 0216 11/01/14 0540  TROPONINI 0.05* 0.05*    Hepatic Function Panel No results for input(s): PROT, ALBUMIN, AST, ALT, ALKPHOS, BILITOT, BILIDIR, IBILI in the last 72 hours. No results for input(s): INR in the last 72 hours.   BNP (last 3 results) 563 (not pro-BNP)  Lipid Panel     Component Value Date/Time   CHOL 147 11/01/2014  0216   TRIG 134 11/01/2014 0216   HDL 48 11/01/2014 0216   CHOLHDL 3.1 11/01/2014 0216   VLDL 27 11/01/2014 0216   LDLCALC 72 11/01/2014 0216      Imaging:  Dg Chest 2 View  10/31/2014   CLINICAL DATA:  Dyspnea.  EXAM: CHEST  2 VIEW  COMPARISON:  October 26, 2014.  FINDINGS: Stable cardiomegaly with central pulmonary vascular congestion. Stable interstitial densities are noted throughout both lungs which may represent scarring, but superimposed pulmonary edema cannot be excluded. No pneumothorax is noted. Minimal bilateral pleural effusions are noted. Bony thorax is intact.  IMPRESSION: Stable cardiomegaly with stable central pulmonary vascular congestion. Stable interstitial densities are noted throughout both  lungs which may represent scarring, but superimposed pulmonary edema cannot be excluded.   Electronically Signed   By: Sabino Dick M.D.   On: 10/31/2014 16:36   ECHO Study Conclusions  - Left ventricle: The cavity size was mildly dilated. Wall thickness was increased in a pattern of mild LVH. Systolic function was severely reduced. The estimated ejection fraction was in the range of 25% to 30%. Diffuse hypokinesis. - Ventricular septum: Septal motion showed paradox. - Mitral valve: There was mild regurgitation. - Left atrium: The atrium was moderately dilated. - Right atrium: The atrium was moderately dilated.  Impressions:  - Patient is significantly tachycardic 130-140 throughout the study.   Assessment/Plan:   Principal Problem:   Chest pain Active Problems:   Hypertension   Diabetes mellitus type 2 in obese   Hyperlipidemia associated with type 2 diabetes mellitus   Smoker   Elevated troponin I level   COPD exacerbation   1. Acute on chronic probable combined CHF; EF 25-30% on echo yesterday. Continue diuresis, ARB; may benefit from aldosterone blockade but will not start today. BNP elevated at 563 2. Chest pain: none presently for myoview today to assess ischemic etiology 3. Mildly elevated troponin ? CHF mediated 4. DM2; not well controlled 5. Essential HTN: Will further titrate irbesartan to 150 mg bid 6. Hyperlipidemia: Currently on Crestor 20 mg. Recent lipid panel reviewed. LDL at 72 7. COPD 8. Tobacco history   Troy Sine, MD, [facility] 11/02/2014, 8:49 AM

## 2014-11-02 NOTE — Progress Notes (Signed)
UR completed.

## 2014-11-02 NOTE — Progress Notes (Signed)
Gave pt morning dose of metoprolol by mistake given pt has stress test scheduled this am and is NPO.  Called cardiologist on call. Was told since pt was scheduled for a pharmacologic lexiscan myoview and not a treadmill stress test that it was ok to keep pt on the schedule for today. Informed day RN.  Randell Detter, Julieta Gutting RN

## 2014-11-02 NOTE — Progress Notes (Signed)
Discussed Myoview results with Dr Claiborne Billings. Pt needs diuresis over the weekend and then cath on Monday. I have scheduled this with the cath lab.  [name] PA-C 11/02/2014 3:35 PM

## 2014-11-02 NOTE — Progress Notes (Signed)
At beginning of shift, noticed pt's HR on the monitor was in the 120-130's. Upon assessment, pt was walking around the room. No complaints of CP, but did indicate SOB. EKG obtained and revealed rapid a flutter. Pt instructed to rest and remain in bed for awhile. VSS.   Before calling cardiologist, noticed pt had converted self back to SR. Upon further review, pt was going in and out of a flutter by self. Cardiologist on call notified. Beta blocker started. Stress test scheduled in am. Will monitor closely tonight.  Joanna Hall, Julieta Gutting RN

## 2014-11-02 NOTE — Progress Notes (Signed)
No further events through the night on telemetry. Pt slept well and tolerated metoprolol (new med) well also. Scheduled stress test this morning and arranged carelink to have pt to [facility] by 10a. Pt upset about not being able to eat or drink this morning. Educated and reassured pt.   Danyael Alipio, Julieta Gutting RN

## 2014-11-02 NOTE — Progress Notes (Signed)
IV lasix given at 1pm, pt has been flushing but states he's been 6 times.  Educated on monitoring output by saving in urinal, pt verb understanding

## 2014-11-02 NOTE — Progress Notes (Signed)
Progress Note   Nicholas Caldwell JJH:[id number] DOB: 1946-07-27 DOA: 10/31/2014 PCP: PROVIDER NOT IN SYSTEM   Brief Narrative:   Nicholas Caldwell is an 68 y.o. male with multiple cardiac risk factors including a history of hypertension, diabetes, obesity, and hyperlipidemia as well as a history of tobacco abuse was admitted 10/31/14 the one month history of intermittent chest pain. Troponins have been mildly elevated through the night.  Assessment/Plan:   Principal Problem:   Chest pain with elevated troponin I level -Continue aspirin and statin therapy. Metoprolol added 11/01/14. -Seen by cardiology with plans for stress testing today.  Active Problems:   Acute systolic CHF/ Dyspnea  -Admission Chest x-ray showed findings consistent with pulmonary edema. ProBNP 563.9. -2-D echocardiogram done 11/01/14, EF 25-30 percent. -Continue to diurese. Weight down approximately 4 pounds. -Likely will need a cardiac cath.    Hypertension -Currently controlled. Continue Avapro/HCTZ.    Diabetes mellitus type 2 in obese -Patient has been refusing insulin. -Seen by diabetes coordinator 11/01/14, however the patient was not receptive to education and was hostile. -Continue Glucotrol and metformin. -Understands the importance of getting his A1c down. -CBGs [id number].    Hyperlipidemia associated with type 2 diabetes mellitus -Lipids currently controlled on Crestor.    Smoker -Tobacco cessation counseling performed.    COPD -Continue Spiriva and theophylline.  No evidence of bronchospasm.  D/C steroids.    DVT Prophylaxis - Continue Heparin.  Code Status: Full. Family Communication: No family at the bedside. Disposition Plan: Home when stable.   IV Access:    Peripheral IV   Procedures and diagnostic studies:   Dg Chest 2 View 10/31/2014: Stable cardiomegaly with stable central pulmonary vascular congestion. Stable interstitial densities are noted throughout both lungs which may  represent scarring, but superimposed pulmonary edema cannot be excluded.    2-D echocardiogram 11/01/14: EF 25-30 percent. Diffuse hypokinesis. Paradoxical movement of ventricular septum.  Medical Consultants:    Dr. Shelva Majestic, Cardiology  Anti-Infectives:    None.  Subjective:   Nicholas Caldwell denies current chest pain.  Still feels a bit dizzy headed at times.  Dyspnea improving.  No cough.  Objective:    Filed Vitals:   11/01/14 2117 11/01/14 2222 11/02/14 0134 11/02/14 0548  BP: 109/61 137/69 123/77 130/73  Pulse: 117 100 89 80  Temp: 98.2 F (36.8 C)  98.3 F (36.8 C) 98.9 F (37.2 C)  TempSrc: Oral  Oral Oral  Resp: _0 Height:      Weight:    118.434 kg (261 lb 1.6 oz)  SpO2: 92% 92% 94% 96%    Intake/Output Summary (Last 24 hours) at 11/02/14 0808 Last data filed at 11/02/14 0649  Gross per 24 hour  Intake    960 ml  Output      0 ml  Net    960 ml    Exam: Gen:  NAD Cardiovascular:  RRR, No M/R/G Respiratory:  Lungs CTAB Gastrointestinal:  Abdomen soft, NT/ND, + BS Extremities:  !+ edema   Data Reviewed:    Labs: Basic Metabolic Panel:  Recent Labs Lab 10/26/14 1957 10/31/14 1535 10/31/14 2345 11/02/14 0503  NA 138 139  --  137  K 3.5 4.0  --  4.0  CL 101 105  --  99  CO2 29 27  --  29  GLUCOSE 226* 211*  --  219*  BUN 11 12  --  17  CREATININE 0.82 0.84 0.81 0.86  CALCIUM 8.5  8.8  --  9.0   GFR Estimated Creatinine Clearance: 117.2 mL/min (by C-G formula based on Cr of 0.86). Liver Function Tests:  Recent Labs Lab 10/26/14 1957  AST 21  ALT 20  ALKPHOS 120*  BILITOT 0.4  PROT 7.3  ALBUMIN 3.5   CBC:  Recent Labs Lab 10/31/14 1535 10/31/14 2345  WBC 7.2 7.4  HGB 12.3* 12.7*  HCT 39.8 39.6  MCV 86.1 85.5  PLT 430* 394   Cardiac Enzymes:  Recent Labs Lab 10/31/14 1602 10/31/14 2050 10/31/14 2345 11/01/14 0216 11/01/14 0540  TROPONINI 0.06* 0.05* 0.06* 0.05* 0.05*   BNP (last 3 results) No  results for input(s): PROBNP in the last 8760 hours. CBG:  Recent Labs Lab 11/01/14 0728 11/01/14 1151 11/01/14 1630 11/01/14 2113 11/02/14 0722  GLUCAP 144* 160* 243* 189* 223*   D-Dimer:  Recent Labs  10/31/14 1602  DDIMER 0.42   Hgb A1c:  Recent Labs  10/31/14 2345  HGBA1C 10.1*   Lipid Profile:  Recent Labs  11/01/14 0216  CHOL 147  HDL 48  LDLCALC 72  TRIG 134  CHOLHDL 3.1   Microbiology No results found for this or any previous visit (from the past 240 hour(s)).   Medications:   . aspirin  81 mg Oral Daily  . glipiZIDE  20 mg Oral BID AC  . heparin  5,000 Units Subcutaneous Q8H  . irbesartan  150 mg Oral Daily   And  . hydrochlorothiazide  12.5 mg Oral Daily  . insulin aspart  0-15 Units Subcutaneous TID WC  . metFORMIN  1,000 mg Oral BID WC  . methadone  10 mg Oral Q12H  . metoprolol tartrate  25 mg Oral 3 times per day  . predniSONE  10 mg Oral 3 x daily with food  . [START ON 11/03/2014] predniSONE  10 mg Oral 4X daily taper  . predniSONE  20 mg Oral Nightly  . rosuvastatin  20 mg Oral Daily  . theophylline  200 mg Oral Daily  . tiotropium  18 mcg Inhalation Daily  . [START ON 11/05/2014] Vitamin D (Ergocalciferol)  50,000 Units Oral Q7 days   Continuous Infusions:   Time spent: 35 minutes with > 50% of time discussing current diagnostic test results, clinical impression and plan of care.     LOS: 2 days   RAMA,CHRISTINA  [facility] Pager [phone]. If unable to reach me by pager, please call my cell phone at [phone].  *Please refer to [email or web address], password [email or web address] to get updated schedule on who will round on this patient, as hospitalists switch teams weekly. If 7PM-7AM, please contact night-coverage at [email or web address], password [email or web address] for any overnight needs.  11/02/2014, 8:08 AM

## 2014-11-03 ENCOUNTER — Inpatient Hospital Stay (HOSPITAL_COMMUNITY): Payer: Medicare Other

## 2014-11-03 DIAGNOSIS — E780 Pure hypercholesterolemia: Secondary | ICD-10-CM | POA: Diagnosis present

## 2014-11-03 DIAGNOSIS — Z6833 Body mass index (BMI) 33.0-33.9, adult: Secondary | ICD-10-CM | POA: Diagnosis not present

## 2014-11-03 DIAGNOSIS — Z720 Tobacco use: Secondary | ICD-10-CM | POA: Diagnosis not present

## 2014-11-03 DIAGNOSIS — J441 Chronic obstructive pulmonary disease with (acute) exacerbation: Secondary | ICD-10-CM | POA: Diagnosis present

## 2014-11-03 DIAGNOSIS — I509 Heart failure, unspecified: Secondary | ICD-10-CM | POA: Diagnosis not present

## 2014-11-03 DIAGNOSIS — I255 Ischemic cardiomyopathy: Secondary | ICD-10-CM | POA: Diagnosis present

## 2014-11-03 DIAGNOSIS — F17210 Nicotine dependence, cigarettes, uncomplicated: Secondary | ICD-10-CM | POA: Diagnosis present

## 2014-11-03 DIAGNOSIS — R079 Chest pain, unspecified: Secondary | ICD-10-CM | POA: Diagnosis not present

## 2014-11-03 DIAGNOSIS — E119 Type 2 diabetes mellitus without complications: Secondary | ICD-10-CM | POA: Diagnosis not present

## 2014-11-03 DIAGNOSIS — E785 Hyperlipidemia, unspecified: Secondary | ICD-10-CM | POA: Diagnosis present

## 2014-11-03 DIAGNOSIS — I10 Essential (primary) hypertension: Secondary | ICD-10-CM | POA: Diagnosis present

## 2014-11-03 DIAGNOSIS — E876 Hypokalemia: Secondary | ICD-10-CM | POA: Diagnosis not present

## 2014-11-03 DIAGNOSIS — R0789 Other chest pain: Secondary | ICD-10-CM | POA: Diagnosis not present

## 2014-11-03 DIAGNOSIS — R0600 Dyspnea, unspecified: Secondary | ICD-10-CM | POA: Diagnosis not present

## 2014-11-03 DIAGNOSIS — J449 Chronic obstructive pulmonary disease, unspecified: Secondary | ICD-10-CM | POA: Diagnosis present

## 2014-11-03 DIAGNOSIS — E669 Obesity, unspecified: Secondary | ICD-10-CM | POA: Diagnosis not present

## 2014-11-03 DIAGNOSIS — I5021 Acute systolic (congestive) heart failure: Secondary | ICD-10-CM | POA: Diagnosis not present

## 2014-11-03 DIAGNOSIS — R7989 Other specified abnormal findings of blood chemistry: Secondary | ICD-10-CM | POA: Diagnosis not present

## 2014-11-03 DIAGNOSIS — I517 Cardiomegaly: Secondary | ICD-10-CM | POA: Diagnosis not present

## 2014-11-03 DIAGNOSIS — E1169 Type 2 diabetes mellitus with other specified complication: Secondary | ICD-10-CM | POA: Diagnosis not present

## 2014-11-03 DIAGNOSIS — E1165 Type 2 diabetes mellitus with hyperglycemia: Secondary | ICD-10-CM | POA: Diagnosis present

## 2014-11-03 LAB — GLUCOSE, CAPILLARY
Glucose-Capillary: 109 mg/dL — ABNORMAL HIGH (ref 70–99)
Glucose-Capillary: 209 mg/dL — ABNORMAL HIGH (ref 70–99)
Glucose-Capillary: 138 mg/dL — ABNORMAL HIGH (ref 70–99)
Glucose-Capillary: 151 mg/dL — ABNORMAL HIGH (ref 70–99)

## 2014-11-03 MED ORDER — METOPROLOL TARTRATE 50 MG PO TABS
50.0000 mg | ORAL_TABLET | Freq: Two times a day (BID) | ORAL | Status: DC
  Administered 2014-11-03 – 2014-11-06 (×7): 50 mg via ORAL
  Filled 2014-11-03 (×9): qty 1

## 2014-11-03 MED ORDER — FUROSEMIDE 10 MG/ML IJ SOLN: 20.0000 mg | Freq: Once | INTRAMUSCULAR | Status: DC

## 2014-11-03 MED ORDER — FUROSEMIDE 10 MG/ML IJ SOLN
40.0000 mg | Freq: Two times a day (BID) | INTRAMUSCULAR | Status: DC
  Administered 2014-11-03 – 2014-11-05 (×5): 40 mg via INTRAVENOUS
  Filled 2014-11-03 (×6): qty 4

## 2014-11-03 NOTE — Progress Notes (Signed)
Subjective:  No chest pain; Breathing better  Objective:   Vital Signs in the last 24 hours: Temp:  [98 F (36.7 C)-98.1 F (36.7 C)] 98.1 F (36.7 C) (01/01 0555) Pulse Rate:  [70-92] 70 (01/01 0925) Resp:  [16-18] 16 (01/01 0555) BP: (118-142)/(69-88) 118/69 mmHg (01/01 0925) SpO2:  [92 %-99 %] 93 % (01/01 0925) Weight:  [263 lb 11.2 oz (119.614 kg)] 263 lb 11.2 oz (119.614 kg) (01/01 0637)   Weight 269 ==>263  Intake/Output from previous day: 12/31 0701 - 01/01 0700 In: 600 [P.O.:600] Out: 1000 [Urine:1000] Net: -400  I/O since admission: +560  Medications: . aspirin  81 mg Oral Daily  . furosemide  40 mg Intravenous BID  . glipiZIDE  20 mg Oral BID AC  . heparin  5,000 Units Subcutaneous Q8H  . irbesartan  150 mg Oral BID   And  . hydrochlorothiazide  25 mg Oral Daily  . insulin aspart  0-15 Units Subcutaneous TID WC  . metFORMIN  1,000 mg Oral BID WC  . methadone  10 mg Oral Q12H  . metoprolol tartrate  25 mg Oral 3 times per day  . rosuvastatin  20 mg Oral Daily  . theophylline  200 mg Oral Daily  . tiotropium  18 mcg Inhalation Daily  . [START ON 11/05/2014] Vitamin D (Ergocalciferol)  50,000 Units Oral Q7 days       Physical Exam:  General appearance: alert, cooperative and no distress; moderate obesity Neck: no adenopathy, no carotid bruit, no JVD, supple, symmetrical, trachea midline and thyroid not enlarged, symmetric, no tenderness/mass/nodules Lungs: Decreased breath sounds without audible wheezes Chest wall: Nontender to palpation Heart: regular rate and rhythm; 1/6 systolic murmur Abdomen: Moderate central adiposity; soft, non-tender; bowel sounds normal; no masses, no organomegaly Back: No CVA tenderness Extremities: Trace pretibial edema bilaterally; not significantly changed Pulses: 2+ and symmetric Skin: Skin color, texture, turgor normal. No rashes or lesions Neurologic: Alert and oriented X 3, normal strength and tone. Normal symmetric  reflexes. Normal coordination and gait  Psychological: Normal affect and mood   Rate: 65  Rhythm: normal sinus rhythm  ECG (independently read by me): NSR at 98, LVH   Lab Results:  BMP Latest Ref Rng 11/02/2014 10/31/2014 10/31/2014  Glucose 70 - 99 mg/dL 219(H) - 211(H)  BUN 6 - 23 mg/dL 17 - 12  Creatinine 0.50 - 1.35 mg/dL 0.86 0.81 0.84  Sodium 135 - 145 mmol/L 137 - 139  Potassium 3.5 - 5.1 mmol/L 4.0 - 4.0  Chloride 96 - 112 mEq/L 99 - 105  CO2 19 - 32 mmol/L 29 - 27  Calcium 8.4 - 10.5 mg/dL 9.0 - 8.8     CBC Latest Ref Rng 10/31/2014 10/31/2014  WBC 4.0 - 10.5 K/uL 7.4 7.2  Hemoglobin 13.0 - 17.0 g/dL 12.7(L) 12.3(L)  Hematocrit 39.0 - 52.0 % 39.6 39.8  Platelets 150 - 400 K/uL 394 430(H)      Recent Labs  11/01/14 0216 11/01/14 0540  TROPONINI 0.05* 0.05*    Hepatic Function Panel No results for input(s): PROT, ALBUMIN, AST, ALT, ALKPHOS, BILITOT, BILIDIR, IBILI in the last 72 hours. No results for input(s): INR in the last 72 hours.   BNP (last 3 results) 563 (not pro-BNP)    Lipid Panel     Component Value Date/Time   CHOL 147 11/01/2014 0216   TRIG 134 11/01/2014 0216   HDL 48 11/01/2014 0216   CHOLHDL 3.1 11/01/2014 0216   VLDL 27 11/01/2014  0216   LDLCALC 72 11/01/2014 0216      Imaging:  Nm Myocar Multi W/spect W/wall Motion / Ef  11/02/2014   CLINICAL DATA:  Chest pain.  History of hypertension.  EXAM: MYOCARDIAL IMAGING WITH SPECT (REST AND PHARMACOLOGIC-STRESS)  GATED LEFT VENTRICULAR WALL MOTION STUDY  LEFT VENTRICULAR EJECTION FRACTION  TECHNIQUE: Standard myocardial SPECT imaging was performed after resting intravenous injection of 10 mCi [id number]. Subsequently, intravenous infusion of Lexiscan was performed under the supervision of the Cardiology staff. At peak effect of the drug, 30 mCi [id number] was injected intravenously and standard myocardial SPECT imaging was performed. Quantitative gated imaging was also  performed to evaluate left ventricular wall motion, and estimate left ventricular ejection fraction.  COMPARISON:  None.  FINDINGS: Perfusion: Moderate area of decreased activity in the inferoapical region consistent with scar/infarction.  Wall Motion: Mild global hypokinesis and no contraction or wall thickening in the inferoapical region.  Left Ventricular Ejection Fraction: 32 %  End diastolic volume 26235l  End systolic volume 17361l  IMPRESSION: 1. Inferoapical scar/infarction. No findings for myocardial ischemia.  2. Mild global hypokinesis and no contraction or wall thickening in the area of scar.  3. Left ventricular ejection fraction 32%  4. High-risk stress test findings*.  *2012 Appropriate Use Criteria for Coronary Revascularization Focused Update: J Am Coll Cardiol. 204431;54(0):[email or web address]://[email or web address]?[email or web address]   Electronically Signed   By: MaKalman Jewels.D.   On: 11/02/2014 14:21   Dg Chest Port 1 View  11/03/2014   CLINICAL DATA:  Heart failure.  Shortness of breath, cough.  EXAM: PORTABLE CHEST - 1 VIEW  COMPARISON:  10/31/2014  FINDINGS: Cardiomegaly with vascular congestion and diffuse interstitial prominence. I favor this represents interstitial edema although a component of underlying chronic interstitial lung disease is possible. No visible effusions or acute bony abnormality.  IMPRESSION: Cardiomegaly with stable diffuse interstitial prominence concerning for interstitial edema. Cannot exclude underlying chronic interstitial lung disease.   Electronically Signed   By: KeRolm Baptise.D.   On: 11/03/2014 11:12   ECHO Study Conclusions  - Left ventricle: The cavity size was mildly dilated. Wall thickness was increased in a pattern of mild LVH. Systolic function was severely reduced. The estimated ejection fraction was in the range of 25% to 30%. Diffuse hypokinesis. - Ventricular septum: Septal motion showed paradox. - Mitral valve: There was  mild regurgitation. - Left atrium: The atrium was moderately dilated. - Right atrium: The atrium was moderately dilated.  Impressions:  - Patient is significantly tachycardic 130-140 throughout the study.   Assessment/Plan:   Principal Problem:   Chest pain Active Problems:   Hypertension   Diabetes mellitus type 2 in obese   Hyperlipidemia associated with type 2 diabetes mellitus   Smoker   Elevated troponin I level   COPD exacerbation   Acute systolic CHF (congestive heart failure)   Pain in the chest   Dyspnea   1. Acute on chronic probable combined CHF; EF 25-30% on echo yesterday. Continue diuresis, ARB; may benefit from aldosterone blockade but will not start today. BNP elevated at 563 2. Ischemic cardiomyopathy with inferoapical scar EF 32% on nuclear study.  3. Mildly elevated troponin ? CHF mediated 4. DM2; not well controlled 5. Essential HTN: Now on irbesartan 150 mg bid, HCTZ, metoprolol.  6. Hyperlipidemia: Currently on Crestor 20 mg. Recent lipid panel reviewed. LDL at 72 7. COPD 8. Tobacco history  I had a long discussion with patient . Reviewed echo data  and nuclear findings. Discussed plan for R and L heart cath, tentatively on schedule for Monday. Will titrate metoprolol to 50 mg bid from 25 mg every 8 hrs.   Troy Sine, MD, [facility] 11/03/2014, 11:21 AM

## 2014-11-03 NOTE — Progress Notes (Signed)
UR completed.

## 2014-11-03 NOTE — Progress Notes (Signed)
Progress Note   Nicholas Caldwell OVF:[id number] DOB: 06-03-1946 DOA: 10/31/2014 PCP: PROVIDER NOT IN SYSTEM   Brief Narrative:   Nicholas Caldwell is an 69 y.o. male with multiple cardiac risk factors including a history of hypertension, diabetes, obesity, and hyperlipidemia as well as a history of tobacco abuse was admitted 10/31/14 the one month history of intermittent chest pain. Troponins have been mildly elevated through the night.  Assessment/Plan:   Principal Problem:   Chest pain with elevated troponin I level -Continue aspirin and statin therapy. Metoprolol added 11/01/14. -Seen by cardiology, s/p stress test 11/02/14 with findings of inferoapical scar/high risk findings.  Cath scheduled for 11/06/14. -Repeat CXR to evaluate for underlying pneumonia given patient's recent treatment of pneumonia (afebrile with normal WBC).  Active Problems:   Acute systolic CHF/ Dyspnea  -Admission Chest x-ray showed findings consistent with pulmonary edema. ProBNP 563.9. -2-D echocardiogram done 11/01/14, EF 25-30 percent. -Continue to diurese. Increase Lasix to 40 mg BID given ongoing dyspnea.  Weight down approximately 6 pounds. -Cath scheduled 11/06/14.    Hypertension -Currently controlled. Continue Avapro/HCTZ.    Diabetes mellitus type 2 in obese -Seen by diabetes coordinator 11/01/14, however the patient was not receptive to education and was hostile. -Continue Glucotrol and metformin.  Continue moderate scale SSI.  Off steroids now. -Understands the importance of getting his A1c down and has been cooperative with insulin therapy. -CBGs [id number].    Hyperlipidemia associated with type 2 diabetes mellitus -Lipids currently controlled on Crestor.    Smoker -Tobacco cessation counseling performed.    COPD -Continue Spiriva and theophylline.  No evidence of bronchospasm.  Off steroids.    DVT Prophylaxis - Continue Heparin.  Code Status: Full. Family Communication: No family at the  bedside. Disposition Plan: Home when stable.   IV Access:    Peripheral IV   Procedures and diagnostic studies:   Dg Chest 2 View 10/31/2014: Stable cardiomegaly with stable central pulmonary vascular congestion. Stable interstitial densities are noted throughout both lungs which may represent scarring, but superimposed pulmonary edema cannot be excluded.    2-D echocardiogram 11/01/14: EF 25-30 percent. Diffuse hypokinesis. Paradoxical movement of ventricular septum.  Nm Myocar Multi W/spect W/wall Motion / Ef 11/02/2014: 1. Inferoapical scar/infarction. No findings for myocardial ischemia.  2. Mild global hypokinesis and no contraction or wall thickening in the area of scar.  3. Left ventricular ejection fraction 32%  4. High-risk stress test findings*.  *2012 Appropriate Use Criteria for Coronary Revascularization Focused Update: J Am Coll Cardiol. 8416;60(6):301-601. http://content.[email or web address].aspx?articleid=1201161      Medical Consultants:    Dr. Shelva Majestic, Cardiology  Anti-Infectives:    None.  Subjective:   Nicholas Caldwell denies current chest pain. He is very anxious/upset.  Feels he is hearing different things from different people.  He thinks his heart problems may be stemming from pneumonia. He is worried that the SQ heparin will cause a brain bleed.  He is upset that he has to have a heart cath and is worried that the procedure is too risky given his elevated A1c.  When attempts are made to clarify things, he gets more upset and will not hear any further explanation of current diagnosis/treatment plan.  He admits he is "scared" and then apologizes for his behavior.  He has had some ongoing SOB and a cough.  Objective:    Filed Vitals:   11/02/14 1436 11/02/14 2059 11/03/14 0555 11/03/14 0637  BP:  142/88 129/84   Pulse:  92  71   Temp:  98.1 F (36.7 C) 98.1 F (36.7 C)   TempSrc:  Oral Oral   Resp:  18 16   Height:      Weight:    119.614 kg (263  lb 11.2 oz)  SpO2: 93% 95% 92%     Intake/Output Summary (Last 24 hours) at 11/03/14 0736 Last data filed at 11/03/14 0556  Gross per 24 hour  Intake    600 ml  Output   1000 ml  Net   -400 ml    Exam: Gen:  NAD Cardiovascular:  RRR, No M/R/G Respiratory:  Lungs CTAB Gastrointestinal:  Abdomen soft, NT/ND, + BS Extremities:  !+ edema   Data Reviewed:    Labs: Basic Metabolic Panel:  Recent Labs Lab 10/31/14 1535 10/31/14 2345 11/02/14 0503  NA 139  --  137  K 4.0  --  4.0  CL 105  --  99  CO2 27  --  29  GLUCOSE 211*  --  219*  BUN 12  --  17  CREATININE 0.84 0.81 0.86  CALCIUM 8.8  --  9.0   GFR Estimated Creatinine Clearance: 117.7 mL/min (by C-G formula based on Cr of 0.86). Liver Function Tests: No results for input(s): AST, ALT, ALKPHOS, BILITOT, PROT, ALBUMIN in the last 168 hours. CBC:  Recent Labs Lab 10/31/14 1535 10/31/14 2345  WBC 7.2 7.4  HGB 12.3* 12.7*  HCT 39.8 39.6  MCV 86.1 85.5  PLT 430* 394   Cardiac Enzymes:  Recent Labs Lab 10/31/14 1602 10/31/14 2050 10/31/14 2345 11/01/14 0216 11/01/14 0540  TROPONINI 0.06* 0.05* 0.06* 0.05* 0.05*   BNP (last 3 results) No results for input(s): PROBNP in the last 8760 hours. CBG:  Recent Labs Lab 11/01/14 0728 11/01/14 1151 11/01/14 1630 11/01/14 2113 11/02/14 0722  GLUCAP 144* 160* 243* 189* 223*   D-Dimer:  Recent Labs  10/31/14 1602  DDIMER 0.42   Hgb A1c:  Recent Labs  10/31/14 2345  HGBA1C 10.1*   Lipid Profile:  Recent Labs  11/01/14 0216  CHOL 147  HDL 48  LDLCALC 72  TRIG 134  CHOLHDL 3.1   Microbiology No results found for this or any previous visit (from the past 240 hour(s)).   Medications:   . aspirin  81 mg Oral Daily  . furosemide  40 mg Intravenous Daily  . glipiZIDE  20 mg Oral BID AC  . heparin  5,000 Units Subcutaneous Q8H  . irbesartan  150 mg Oral BID   And  . hydrochlorothiazide  25 mg Oral Daily  . insulin aspart  0-15  Units Subcutaneous TID WC  . metFORMIN  1,000 mg Oral BID WC  . methadone  10 mg Oral Q12H  . metoprolol tartrate  25 mg Oral 3 times per day  . rosuvastatin  20 mg Oral Daily  . theophylline  200 mg Oral Daily  . tiotropium  18 mcg Inhalation Daily  . [START ON 11/05/2014] Vitamin D (Ergocalciferol)  50,000 Units Oral Q7 days   Continuous Infusions:   Time spent: 35 minutes with > 50% of time discussing current diagnostic test results, clinical impression and plan of care.     LOS: 3 days   [facility] Pager [phone]. If unable to reach me by pager, please call my cell phone at [phone].  *Please refer to [email or web address], password [email or web address] to get updated schedule on who will round on this patient, as hospitalists switch teams weekly. If 7PM-7AM,  please contact night-coverage at [email or web address], password [email or web address] for any overnight needs.  11/03/2014, 7:36 AM

## 2014-11-04 DIAGNOSIS — J441 Chronic obstructive pulmonary disease with (acute) exacerbation: Secondary | ICD-10-CM

## 2014-11-04 DIAGNOSIS — E876 Hypokalemia: Secondary | ICD-10-CM

## 2014-11-04 LAB — BASIC METABOLIC PANEL
Anion gap: 10 (ref 5–15)
BUN: 24 mg/dL — ABNORMAL HIGH (ref 6–23)
CALCIUM: 8.8 mg/dL (ref 8.4–10.5)
CO2: 31 mmol/L (ref 19–32)
Chloride: 97 mEq/L (ref 96–112)
Creatinine, Ser: 0.96 mg/dL (ref 0.50–1.35)
GFR calc Af Amer: >90 mL/min (ref >90)
GFR calc non Af Amer: 84 mL/min — ABNORMAL LOW (ref >90)
GLUCOSE: 134 mg/dL — AB (ref 70–99)
Potassium: 3.4 mmol/L — ABNORMAL LOW (ref 3.5–5.1)
SODIUM: 138 mmol/L (ref 135–145)

## 2014-11-04 LAB — CBC
HCT: 38.9 % — ABNORMAL LOW (ref 39.0–52.0)
HEMOGLOBIN: 12.1 g/dL — AB (ref 13.0–17.0)
MCH: 26.8 pg (ref 26.0–34.0)
MCHC: 31.1 g/dL (ref 30.0–36.0)
MCV: 86.1 fL (ref 78.0–100.0)
Platelets: 429 10*3/uL — ABNORMAL HIGH (ref 150–400)
RBC: 4.52 MIL/uL (ref 4.22–5.81)
RDW: 14.8 % (ref 11.5–15.5)
WBC: 7.8 10*3/uL (ref 4.0–10.5)

## 2014-11-04 LAB — GLUCOSE, CAPILLARY
GLUCOSE-CAPILLARY: 112 mg/dL — AB (ref 70–99)
Glucose-Capillary: 186 mg/dL — ABNORMAL HIGH (ref 70–99)
Glucose-Capillary: 221 mg/dL — ABNORMAL HIGH (ref 70–99)
Glucose-Capillary: 146 mg/dL — ABNORMAL HIGH (ref 70–99)

## 2014-11-04 LAB — BRAIN NATRIURETIC PEPTIDE: B NATRIURETIC PEPTIDE 5: 363.6 pg/mL — AB (ref 0.0–100.0)

## 2014-11-04 MED ORDER — ALBUTEROL SULFATE (2.5 MG/3ML) 0.083% IN NEBU: 2.5000 mg | INHALATION_SOLUTION | RESPIRATORY_TRACT | Status: DC | PRN

## 2014-11-04 MED ORDER — POTASSIUM CHLORIDE CRYS ER 20 MEQ PO TBCR
40.0000 meq | EXTENDED_RELEASE_TABLET | Freq: Once | ORAL | Status: AC
  Administered 2014-11-04: 40 meq via ORAL
  Filled 2014-11-04: qty 2

## 2014-11-04 MED ORDER — INSULIN ASPART 100 UNIT/ML ~~LOC~~ SOLN
0.0000 [IU] | Freq: Three times a day (TID) | SUBCUTANEOUS | Status: DC
  Administered 2014-11-04: 5 [IU] via SUBCUTANEOUS
  Administered 2014-11-04: 3 [IU] via SUBCUTANEOUS

## 2014-11-04 MED ORDER — INSULIN ASPART 100 UNIT/ML ~~LOC~~ SOLN: 0.0000 [IU] | Freq: Every day | SUBCUTANEOUS | Status: DC

## 2014-11-04 MED ORDER — POTASSIUM CHLORIDE CRYS ER 20 MEQ PO TBCR
20.0000 meq | EXTENDED_RELEASE_TABLET | Freq: Two times a day (BID) | ORAL | Status: DC
  Administered 2014-11-04: 20 meq via ORAL
  Filled 2014-11-04 (×2): qty 1

## 2014-11-04 NOTE — Progress Notes (Signed)
Subjective:  69 yo admmitted with CP and dyspnea. Has been found to have systolic CHF - EF 29-92%.  Plan is for R and L heart cath Monday.  No chest pain; Breathing better  Objective:   Vital Signs in the last 24 hours: Temp:  [97.7 F (36.5 C)-98.4 F (36.9 C)] 97.7 F (36.5 C) (01/01 2046) Pulse Rate:  [70-79] 79 (01/01 2046) Resp:  [16-18] 18 (01/01 2046) BP: (118-151)/(62-115) 124/77 mmHg (01/01 2046) SpO2:  [93 %-99 %] 95 % (01/01 2046) Weight:  [262 lb 11.2 oz (119.16 kg)] 262 lb 11.2 oz (119.16 kg) (01/01 1153)   Weight 269 ==>262  Intake/Output from previous day: 01/01 0701 - 01/02 0700 In: 720 [P.O.:720] Out: 1925 [Urine:1925]   I/O since admission: +645  Medications: . aspirin  81 mg Oral Daily  . furosemide  20 mg Intravenous Once  . furosemide  40 mg Intravenous BID  . glipiZIDE  20 mg Oral BID AC  . heparin  5,000 Units Subcutaneous Q8H  . irbesartan  150 mg Oral BID   And  . hydrochlorothiazide  25 mg Oral Daily  . insulin aspart  0-15 Units Subcutaneous TID WC  . metFORMIN  1,000 mg Oral BID WC  . methadone  10 mg Oral Q12H  . metoprolol tartrate  50 mg Oral BID  . rosuvastatin  20 mg Oral Daily  . theophylline  200 mg Oral Daily  . tiotropium  18 mcg Inhalation Daily  . [START ON 11/05/2014] Vitamin D (Ergocalciferol)  50,000 Units Oral Q7 days       Physical Exam:  General appearance: alert, cooperative and no distress; moderate obesity Neck: no adenopathy, no carotid bruit, no JVD, supple, symmetrical, trachea midline and thyroid not enlarged, symmetric, no tenderness/mass/nodules Lungs: Decreased breath sounds without audible wheezes Chest wall: Nontender to palpation Heart: regular rate and rhythm; 1/6 systolic murmur Abdomen: Moderate central adiposity; soft, non-tender; bowel sounds normal; no masses, no organomegaly Back: No CVA tenderness Extremities: Trace pretibial edema bilaterally; not significantly changed Pulses: 2+ and  symmetric Skin: Skin color, texture, turgor normal. No rashes or lesions Neurologic: Alert and oriented X 3, normal strength and tone. Normal symmetric reflexes. Normal coordination and gait  Psychological: Normal affect and mood   Rate: 65  Rhythm: normal sinus rhythm    Lab Results:  BMP Latest Ref Rng 11/04/2014 11/02/2014 10/31/2014  Glucose 70 - 99 mg/dL 134(H) 219(H) -  BUN 6 - 23 mg/dL 24(H) 17 -  Creatinine 0.50 - 1.35 mg/dL 0.96 0.86 0.81  Sodium 135 - 145 mmol/L 138 137 -  Potassium 3.5 - 5.1 mmol/L 3.4(L) 4.0 -  Chloride 96 - 112 mEq/L 97 99 -  CO2 19 - 32 mmol/L 31 29 -  Calcium 8.4 - 10.5 mg/dL 8.8 9.0 -     CBC Latest Ref Rng 11/04/2014 10/31/2014 10/31/2014  WBC 4.0 - 10.5 K/uL 7.8 7.4 7.2  Hemoglobin 13.0 - 17.0 g/dL 12.1(L) 12.7(L) 12.3(L)  Hematocrit 39.0 - 52.0 % 38.9(L) 39.6 39.8  Platelets 150 - 400 K/uL 429(H) 394 430(H)     No results for input(s): TROPONINI in the last 72 hours.  Invalid input(s): CK, MB  Hepatic Function Panel No results for input(s): PROT, ALBUMIN, AST, ALT, ALKPHOS, BILITOT, BILIDIR, IBILI in the last 72 hours. No results for input(s): INR in the last 72 hours.   BNP (last 3 results) 563 (not pro-BNP)    Lipid Panel     Component Value Date/Time  CHOL 147 11/01/2014 0216   TRIG 134 11/01/2014 0216   HDL 48 11/01/2014 0216   CHOLHDL 3.1 11/01/2014 0216   VLDL 27 11/01/2014 0216   LDLCALC 72 11/01/2014 0216      Imaging:  Nm Myocar Multi W/spect W/wall Motion / Ef  11/02/2014   CLINICAL DATA:  Chest pain.  History of hypertension.  EXAM: MYOCARDIAL IMAGING WITH SPECT (REST AND PHARMACOLOGIC-STRESS)  GATED LEFT VENTRICULAR WALL MOTION STUDY  LEFT VENTRICULAR EJECTION FRACTION  TECHNIQUE: Standard myocardial SPECT imaging was performed after resting intravenous injection of 10 mCi Tc-81msestamibi. Subsequently, intravenous infusion of Lexiscan was performed under the supervision of the Cardiology staff. At peak effect  of the drug, 30 mCi Tc-915mestamibi was injected intravenously and standard myocardial SPECT imaging was performed. Quantitative gated imaging was also performed to evaluate left ventricular wall motion, and estimate left ventricular ejection fraction.  COMPARISON:  None.  FINDINGS: Perfusion: Moderate area of decreased activity in the inferoapical region consistent with scar/infarction.  Wall Motion: Mild global hypokinesis and no contraction or wall thickening in the inferoapical region.  Left Ventricular Ejection Fraction: 32 %  End diastolic volume 26920l  End systolic volume 17100l  IMPRESSION: 1. Inferoapical scar/infarction. No findings for myocardial ischemia.  2. Mild global hypokinesis and no contraction or wall thickening in the area of scar.  3. Left ventricular ejection fraction 32%  4. High-risk stress test findings*.  *2012 Appropriate Use Criteria for Coronary Revascularization Focused Update: J Am Coll Cardiol. 207121;97(5):883-254http://content.[email or web address]?[email or web address]   Electronically Signed   By: MaKalman Jewels.D.   On: 11/02/2014 14:21   Dg Chest Port 1 View  11/03/2014   CLINICAL DATA:  Heart failure.  Shortness of breath, cough.  EXAM: PORTABLE CHEST - 1 VIEW  COMPARISON:  10/31/2014  FINDINGS: Cardiomegaly with vascular congestion and diffuse interstitial prominence. I favor this represents interstitial edema although a component of underlying chronic interstitial lung disease is possible. No visible effusions or acute bony abnormality.  IMPRESSION: Cardiomegaly with stable diffuse interstitial prominence concerning for interstitial edema. Cannot exclude underlying chronic interstitial lung disease.   Electronically Signed   By: KeRolm Baptise.D.   On: 11/03/2014 11:12   ECHO Study Conclusions  - Left ventricle: The cavity size was mildly dilated. Wall thickness was increased in a pattern of mild LVH. Systolic function was severely reduced. The estimated  ejection fraction was in the range of 25% to 30%. Diffuse hypokinesis. - Ventricular septum: Septal motion showed paradox. - Mitral valve: There was mild regurgitation. - Left atrium: The atrium was moderately dilated. - Right atrium: The atrium was moderately dilated.  Impressions:  - Patient is significantly tachycardic 130-140 throughout the study.   Assessment/Plan:   Principal Problem:   Chest pain Active Problems:   Hypertension   Diabetes mellitus type 2 in obese   Hyperlipidemia associated with type 2 diabetes mellitus   Smoker   Elevated troponin I level   COPD exacerbation   Acute systolic CHF (congestive heart failure)   Pain in the chest   Dyspnea   1. Acute on chronic probable combined CHF; EF 25-30% on echo.. Continue diuresis, ARB;   2. Ischemic cardiomyopathy with inferoapical scar EF 32% on nuclear study.  Cath Monday   3. Mildly elevated troponin: likely due to CHF  4. DM2; not well controlled 5. Essential HTN: Now on irbesartan 150 mg bid,  , metoprolol.  6. Hyperlipidemia: Currently on Crestor 20 mg. Recent lipid panel  reviewed. LDL at 72 7. COPD 8. Tobacco history    Ramond Dial., MD, [facility] 11/04/2014, 8:05 AM [address],  [place] Pager [phone]

## 2014-11-04 NOTE — Progress Notes (Addendum)
Progress Note   Nicholas Caldwell FBP:[id number] DOB: June 11, 1946 DOA: 10/31/2014 PCP: PROVIDER NOT IN SYSTEM   Brief Narrative:   Nicholas Caldwell is an 69 y.o. male with multiple cardiac risk factors including a history of hypertension, diabetes, obesity, and hyperlipidemia as well as a history of tobacco abuse was admitted 10/31/14 the one month history of intermittent chest pain. Troponins have been mildly elevated through the night.  Assessment/Plan:   Principal Problem:   Chest pain with elevated troponin I level -Continue aspirin and statin therapy. Metoprolol added 11/01/14. -Seen by cardiology, s/p stress test 11/02/14 with findings of inferoapical scar/high risk findings.  Cath scheduled for 11/06/14. -Repeat CXR done 11/03/14 to evaluate for underlying pneumonia given patient's recent treatment of pneumonia (afebrile with normal WBC): no pneumonia noted.  Active Problems:   Acute systolic CHF/ Dyspnea  -Admission Chest x-ray showed findings consistent with pulmonary edema. ProBNP 563.9--->363.6. -2-D echocardiogram done 11/01/14, EF 25-30 percent. -Continue to diurese. Increase Lasix to 40 mg BID given ongoing dyspnea.  Weight down approximately 6 pounds. I&O -1.2 L/24 hours. -Cath scheduled 11/06/14.    Hypokalemia -Secondary to Lasix.  Supplement.    Hypertension -Currently controlled. Continue Avapro/HCTZ.    Diabetes mellitus type 2 in obese -Seen by diabetes coordinator 11/01/14, however the patient was not receptive to education and was hostile. -Continue Glucotrol and metformin.  Continue moderate scale SSI.  Off steroids now. -Understands the importance of getting his A1c down and has been cooperative with insulin therapy. -CBGs [id number].  Add HS coverage.    Hyperlipidemia associated with type 2 diabetes mellitus -Lipids currently controlled on Crestor.    Smoker -Tobacco cessation counseling performed.    COPD -Continue Spiriva and theophylline.  No evidence of  bronchospasm.  Off steroids.    DVT Prophylaxis - Continue Heparin.  Code Status: Full. Family Communication: No family at the bedside. Disposition Plan: Home when stable.   IV Access:    Peripheral IV   Procedures and diagnostic studies:   Dg Chest 2 View 10/31/2014: Stable cardiomegaly with stable central pulmonary vascular congestion. Stable interstitial densities are noted throughout both lungs which may represent scarring, but superimposed pulmonary edema cannot be excluded.    2-D echocardiogram 11/01/14: EF 25-30 percent. Diffuse hypokinesis. Paradoxical movement of ventricular septum.  Nm Myocar Multi W/spect W/wall Motion / Ef 11/02/2014: 1. Inferoapical scar/infarction. No findings for myocardial ischemia.  2. Mild global hypokinesis and no contraction or wall thickening in the area of scar.  3. Left ventricular ejection fraction 32%  4. High-risk stress test findings*.  *2012 Appropriate Use Criteria for Coronary Revascularization Focused Update: J Am Coll Cardiol. 7092;95(7):473-403. http://content.[email or web address].aspx?articleid=1201161      Medical Consultants:    Dr. Shelva Majestic, Cardiology  Anti-Infectives:    None.  Subjective:   Nicholas Caldwell denies current chest pain. He had some dyspnea last night, but reports it is better now.  He continues to be apologetic for his behavior yesterday.  He seems quite anxious/frightened about his health.  Objective:    Filed Vitals:   11/03/14 1153 11/03/14 1335 11/03/14 1353 11/03/14 2046  BP:  151/115 128/62 124/77  Pulse:  76  79  Temp:  98.4 F (36.9 C)  97.7 F (36.5 C)  TempSrc:  Oral  Oral  Resp:  16  18  Height:      Weight: 119.16 kg (262 lb 11.2 oz)     SpO2:  93%  95%    Intake/Output Summary (Last  24 hours) at 11/04/14 1021 Last data filed at 11/03/14 2014  Gross per 24 hour  Intake    720 ml  Output   1925 ml  Net  -1205 ml    Exam: Gen:  NAD Cardiovascular:  RRR, No  M/R/G Respiratory:  Lungs diminished with a few high pitched expiratory wheezes Gastrointestinal:  Abdomen soft, NT/ND, + BS Extremities:  !+ edema   Data Reviewed:    Labs: Basic Metabolic Panel:  Recent Labs Lab 10/31/14 1535 10/31/14 2345 11/02/14 0503 11/04/14 0540  NA 139  --  137 138  K 4.0  --  4.0 3.4*  CL 105  --  99 97  CO2 27  --  29 31  GLUCOSE 211*  --  219* 134*  BUN 12  --  17 24*  CREATININE 0.84 0.81 0.86 0.96  CALCIUM 8.8  --  9.0 8.8   GFR Estimated Creatinine Clearance: 105.3 mL/min (by C-G formula based on Cr of 0.96). Liver Function Tests: No results for input(s): AST, ALT, ALKPHOS, BILITOT, PROT, ALBUMIN in the last 168 hours. CBC:  Recent Labs Lab 10/31/14 1535 10/31/14 2345 11/04/14 0540  WBC 7.2 7.4 7.8  HGB 12.3* 12.7* 12.1*  HCT 39.8 39.6 38.9*  MCV 86.1 85.5 86.1  PLT 430* 394 429*   Cardiac Enzymes:  Recent Labs Lab 10/31/14 1602 10/31/14 2050 10/31/14 2345 11/01/14 0216 11/01/14 0540  TROPONINI 0.06* 0.05* 0.06* 0.05* 0.05*   BNP (last 3 results) No results for input(s): PROBNP in the last 8760 hours. CBG:  Recent Labs Lab 11/02/14 0722 11/03/14 0752 11/03/14 1147 11/03/14 1717 11/03/14 2320  GLUCAP 223* 109* 209* 138* 151*   D-Dimer: No results for input(s): DDIMER in the last 72 hours. Hgb A1c: No results for input(s): HGBA1C in the last 72 hours. Lipid Profile: No results for input(s): CHOL, HDL, LDLCALC, TRIG, CHOLHDL, LDLDIRECT in the last 72 hours. Microbiology No results found for this or any previous visit (from the past 240 hour(s)).   Medications:   . aspirin  81 mg Oral Daily  . furosemide  20 mg Intravenous Once  . furosemide  40 mg Intravenous BID  . glipiZIDE  20 mg Oral BID AC  . heparin  5,000 Units Subcutaneous Q8H  . irbesartan  150 mg Oral BID   And  . hydrochlorothiazide  25 mg Oral Daily  . insulin aspart  0-15 Units Subcutaneous TID WC  . metFORMIN  1,000 mg Oral BID WC  .  methadone  10 mg Oral Q12H  . metoprolol tartrate  50 mg Oral BID  . potassium chloride  20 mEq Oral BID  . potassium chloride  40 mEq Oral Once  . rosuvastatin  20 mg Oral Daily  . theophylline  200 mg Oral Daily  . tiotropium  18 mcg Inhalation Daily  . [START ON 11/05/2014] Vitamin D (Ergocalciferol)  50,000 Units Oral Q7 days   Continuous Infusions:   Time spent: 25 minutes.     LOS: 4 days   [facility] Pager [phone]. If unable to reach me by pager, please call my cell phone at [phone].  *Please refer to [email or web address], password [email or web address] to get updated schedule on who will round on this patient, as hospitalists switch teams weekly. If 7PM-7AM, please contact night-coverage at [email or web address], password [email or web address] for any overnight needs.  11/04/2014, 8:12 AM

## 2014-11-05 LAB — BASIC METABOLIC PANEL
ANION GAP: 8 (ref 5–15)
BUN: 25 mg/dL — AB (ref 6–23)
CHLORIDE: 94 meq/L — AB (ref 96–112)
CO2: 31 mmol/L (ref 19–32)
Calcium: 8.7 mg/dL (ref 8.4–10.5)
Creatinine, Ser: 1.06 mg/dL (ref 0.50–1.35)
GFR calc Af Amer: 83 mL/min — ABNORMAL LOW (ref >90)
GFR calc non Af Amer: 71 mL/min — ABNORMAL LOW (ref >90)
Glucose, Bld: 202 mg/dL — ABNORMAL HIGH (ref 70–99)
Potassium: 3 mmol/L — ABNORMAL LOW (ref 3.5–5.1)
SODIUM: 133 mmol/L — AB (ref 135–145)

## 2014-11-05 LAB — GLUCOSE, CAPILLARY
GLUCOSE-CAPILLARY: 141 mg/dL — AB (ref 70–99)
GLUCOSE-CAPILLARY: 200 mg/dL — AB (ref 70–99)
Glucose-Capillary: 159 mg/dL — ABNORMAL HIGH (ref 70–99)
Glucose-Capillary: 166 mg/dL — ABNORMAL HIGH (ref 70–99)

## 2014-11-05 MED ORDER — SODIUM CHLORIDE 0.9 % IJ SOLN
3.0000 mL | Freq: Two times a day (BID) | INTRAMUSCULAR | Status: DC
  Administered 2014-11-05: 3 mL via INTRAVENOUS

## 2014-11-05 MED ORDER — ASPIRIN 81 MG PO CHEW
81.0000 mg | CHEWABLE_TABLET | ORAL | Status: AC
  Administered 2014-11-06: 81 mg via ORAL
  Filled 2014-11-05 (×2): qty 1

## 2014-11-05 MED ORDER — INSULIN ASPART 100 UNIT/ML ~~LOC~~ SOLN
4.0000 [IU] | Freq: Three times a day (TID) | SUBCUTANEOUS | Status: DC
  Administered 2014-11-05 – 2014-11-07 (×4): 4 [IU] via SUBCUTANEOUS

## 2014-11-05 MED ORDER — SODIUM CHLORIDE 0.9 % IJ SOLN: 3.0000 mL | INTRAMUSCULAR | Status: DC | PRN

## 2014-11-05 MED ORDER — ASPIRIN 81 MG PO CHEW
81.0000 mg | CHEWABLE_TABLET | Freq: Every day | ORAL | Status: DC
  Administered 2014-11-07: 81 mg via ORAL
  Filled 2014-11-05 (×2): qty 1

## 2014-11-05 MED ORDER — INSULIN ASPART 100 UNIT/ML ~~LOC~~ SOLN
0.0000 [IU] | Freq: Three times a day (TID) | SUBCUTANEOUS | Status: DC
Start: 2014-11-05 — End: 2014-11-07
  Administered 2014-11-05 (×2): 3 [IU] via SUBCUTANEOUS
  Administered 2014-11-06 – 2014-11-07 (×2): 2 [IU] via SUBCUTANEOUS

## 2014-11-05 MED ORDER — INSULIN ASPART 100 UNIT/ML ~~LOC~~ SOLN: 0.0000 [IU] | Freq: Every day | SUBCUTANEOUS | Status: DC

## 2014-11-05 MED ORDER — SODIUM CHLORIDE 0.9 % IV SOLN
INTRAVENOUS | Status: DC
  Administered 2014-11-06: 04:00:00 via INTRAVENOUS

## 2014-11-05 MED ORDER — POTASSIUM CHLORIDE CRYS ER 20 MEQ PO TBCR
40.0000 meq | EXTENDED_RELEASE_TABLET | Freq: Two times a day (BID) | ORAL | Status: DC
  Administered 2014-11-05 – 2014-11-07 (×4): 40 meq via ORAL
  Filled 2014-11-05 (×6): qty 2

## 2014-11-05 MED ORDER — SODIUM CHLORIDE 0.9 % IV SOLN: 250.0000 mL | INTRAVENOUS | Status: DC | PRN

## 2014-11-05 NOTE — Progress Notes (Signed)
PROGRESS NOTE  Subjective:   69 yo admmitted with CP and dyspnea. Has been found to have systolic CHF - EF 33-35%.  Plan is for R and L heart cath Monday.  No chest pain; Breathing better  Objective:    Vital Signs:   Temp:  [98 F (36.7 C)-98.5 F (36.9 C)] 98.5 F (36.9 C) (01/03 0544) Pulse Rate:  [67-73] 68 (01/03 0544) Resp:  [16-18] 18 (01/03 0544) BP: (120-135)/(61-72) 127/70 mmHg (01/03 0544) SpO2:  [91 %-98 %] 91 % (01/03 0544) Weight:  [261 lb 3.2 oz (118.48 kg)-261 lb 14.5 oz (118.8 kg)] 261 lb 3.2 oz (118.48 kg) (01/03 0544)  Last BM Date: 11/04/14   24-hour weight change: Weight change: -12.7 oz (-0.36 kg)  Weight trends: Filed Weights   11/03/14 1153 11/04/14 1246 11/05/14 0544  Weight: 262 lb 11.2 oz (119.16 kg) 261 lb 14.5 oz (118.8 kg) 261 lb 3.2 oz (118.48 kg)    Intake/Output:  01/02 0701 - 01/03 0700 In: 1200 [P.O.:1200] Out: 1151 [Urine:1150; Stool:1]     Physical Exam: BP 127/70 mmHg  Pulse 68  Temp(Src) 98.5 F (36.9 C) (Oral)  Resp 18  Ht _0  (1.905 m)  Wt 261 lb 3.2 oz (118.48 kg)  BMI 32.65 kg/m2  SpO2 91%  Wt Readings from Last 3 Encounters:  11/05/14 261 lb 3.2 oz (118.48 kg)  10/26/14 246 lb (111.585 kg)    Physical Exam:  General appearance: alert, cooperative and no distress; moderate obesity Neck: no adenopathy, no carotid bruit, no JVD, supple, symmetrical, trachea midline and thyroid not enlarged, symmetric, no tenderness/mass/nodules Lungs: Decreased breath sounds without audible wheezes Chest wall: Nontender to palpation Heart: regular rate and rhythm; 1/6 systolic murmur Abdomen: Moderate central adiposity; soft, non-tender; bowel sounds normal; no masses, no organomegaly Back: No CVA tenderness Extremities: Trace pretibial edema bilaterally; not significantly changed Pulses: 2+ and symmetric Skin: Skin color, texture, turgor normal. No rashes or lesions Neurologic: Alert and oriented X 3, normal  strength and tone. Normal symmetric reflexes. Normal coordination and gait  Psychological: Normal affect and mood  Rate: 65 Rhythm: normal sinus rhythm  Labs: BMET:  Recent Labs  11/04/14 0540 11/05/14 0500  NA 138 133*  K 3.4* 3.0*  CL 97 94*  CO2 31 31  GLUCOSE 134* 202*  BUN 24* 25*  CREATININE 0.96 1.06  CALCIUM 8.8 8.7    Liver function tests: No results for input(s): AST, ALT, ALKPHOS, BILITOT, PROT, ALBUMIN in the last 72 hours. No results for input(s): LIPASE, AMYLASE in the last 72 hours.  CBC:  Recent Labs  11/04/14 0540  WBC 7.8  HGB 12.1*  HCT 38.9*  MCV 86.1  PLT 429*    Cardiac Enzymes: No results for input(s): CKTOTAL, CKMB, TROPONINI in the last 72 hours.  Coagulation Studies: No results for input(s): LABPROT, INR in the last 72 hours.  Other: Invalid input(s): POCBNP No results for input(s): DDIMER in the last 72 hours. No results for input(s): HGBA1C in the last 72 hours. No results for input(s): CHOL, HDL, LDLCALC, TRIG, CHOLHDL in the last 72 hours. No results for input(s): TSH, T4TOTAL, T3FREE, THYROIDAB in the last 72 hours.  Invalid input(s): FREET3 No results for input(s): VITAMINB12, FOLATE, FERRITIN, TIBC, IRON, RETICCTPCT in the last 72 hours.  ECHO Study Conclusions  - Left ventricle: The cavity size was mildly dilated. Wall thickness was increased in a pattern of mild LVH. Systolic function was severely reduced. The  estimated ejection fraction was in the range of 25% to 30%. Diffuse hypokinesis. - Ventricular septum: Septal motion showed paradox. - Mitral valve: There was mild regurgitation. - Left atrium: The atrium was moderately dilated. - Right atrium: The atrium was moderately dilated.  Impressions:  - Patient is significantly tachycardic 130-140 throughout the study. Other results:  Tele:  NSR  Medications:    Infusions:    Scheduled Medications: . aspirin  81 mg Oral Daily  . furosemide  20  mg Intravenous Once  . furosemide  40 mg Intravenous BID  . glipiZIDE  20 mg Oral BID AC  . heparin  5,000 Units Subcutaneous Q8H  . irbesartan  150 mg Oral BID   And  . hydrochlorothiazide  25 mg Oral Daily  . insulin aspart  0-15 Units Subcutaneous TID WC  . insulin aspart  0-5 Units Subcutaneous QHS  . insulin aspart  4 Units Subcutaneous TID WC  . metFORMIN  1,000 mg Oral BID WC  . methadone  10 mg Oral Q12H  . metoprolol tartrate  50 mg Oral BID  . potassium chloride  40 mEq Oral BID  . rosuvastatin  20 mg Oral Daily  . theophylline  200 mg Oral Daily  . tiotropium  18 mcg Inhalation Daily  . Vitamin D (Ergocalciferol)  50,000 Units Oral Q7 days    Assessment/ Plan:    Principal Problem:  Chest pain Active Problems:  Hypertension  Diabetes mellitus type 2 in obese  Hyperlipidemia associated with type 2 diabetes mellitus  Smoker  Elevated troponin I level  COPD exacerbation  Acute systolic CHF (congestive heart failure)  Pain in the chest  Dyspnea   1. Acute on chronic probable combined CHF; EF 25-30% on echo.. Continue diuresis, ARB;   2. Ischemic cardiomyopathy with inferoapical scar EF 32% on nuclear study.  Cath Monday   3. Mildly elevated troponin: likely due to CHF  4. DM2; not well controlled 5. Essential HTN: Now on irbesartan 150 mg bid, , metoprolol.  6. Hyperlipidemia: Currently on Crestor 20 mg. Recent lipid panel reviewed. LDL at 72 7. COPD 8. Tobacco history   Disposition: for cath tomorrow  Length of Stay: 5  Thayer Headings, Brooke Bonito., MD, [facility] 11/05/2014, 8:14 AM Office [phone] Pager [phone]

## 2014-11-05 NOTE — Progress Notes (Signed)
Progress Note   Nicholas Caldwell EGB:[id number] DOB: 06/17/1946 DOA: 10/31/2014 PCP: PROVIDER NOT IN SYSTEM   Brief Narrative:   Nicholas Caldwell is an 70 y.o. male with multiple cardiac risk factors including a history of hypertension, diabetes, obesity, and hyperlipidemia as well as a history of tobacco abuse was admitted 10/31/14 the one month history of intermittent chest pain. Troponins have been mildly elevated through the night.  Assessment/Plan:   Principal Problem:   Chest pain with elevated troponin I level -Continue aspirin and statin therapy. Metoprolol added 11/01/14. -Seen by cardiology, s/p stress test 11/02/14 with findings of inferoapical scar/high risk findings.  Cath scheduled for 11/06/14. -Repeat CXR done 11/03/14 to evaluate for underlying pneumonia given patient's recent treatment of pneumonia (afebrile with normal WBC): no pneumonia noted.  Active Problems:   Acute systolic CHF/ Dyspnea  -Admission Chest x-ray showed findings consistent with pulmonary edema. ProBNP 563.9--->363.6. -2-D echocardiogram done 11/01/14, EF 25-30 percent. -Continue to diurese. Increase Lasix to 40 mg BID given ongoing dyspnea.  Weight down approximately 8 pounds.  -Cath scheduled 11/06/14.    Hypokalemia -Secondary to Lasix.  Increase potassium supplementation.    Hypertension -Currently controlled. Continue Avapro/HCTZ.    Diabetes mellitus type 2 in obese -Seen by diabetes coordinator 11/01/14, however the patient was not receptive to education and was hostile. -CBGs [id number]. Continue Glucotrol and metformin.  Continue moderate scale SSI Q AC/HS.  Add meal coverage. -Understands the importance of getting his A1c down and has been cooperative with insulin therapy.     Hyperlipidemia associated with type 2 diabetes mellitus -Lipids currently controlled on Crestor.    Smoker -Tobacco cessation counseling performed.    COPD -Continue Spiriva and theophylline.  No evidence of  bronchospasm.  Off steroids.    DVT Prophylaxis - Continue Heparin.  Code Status: Full. Family Communication: No family at the bedside. Disposition Plan: Home when stable.   IV Access:    Peripheral IV   Procedures and diagnostic studies:   Dg Chest 2 View 10/31/2014: Stable cardiomegaly with stable central pulmonary vascular congestion. Stable interstitial densities are noted throughout both lungs which may represent scarring, but superimposed pulmonary edema cannot be excluded.    2-D echocardiogram 11/01/14: EF 25-30 percent. Diffuse hypokinesis. Paradoxical movement of ventricular septum.  Nm Myocar Multi W/spect W/wall Motion / Ef 11/02/2014: 1. Inferoapical scar/infarction. No findings for myocardial ischemia.  2. Mild global hypokinesis and no contraction or wall thickening in the area of scar.  3. Left ventricular ejection fraction 32%  4. High-risk stress test findings*.  *2012 Appropriate Use Criteria for Coronary Revascularization Focused Update: J Am Coll Cardiol. 3710;62(6):948-546. http://content.airportbarriers.com.aspx?articleid=1201161      Medical Consultants:    Dr. Shelva Majestic, Cardiology  Anti-Infectives:    None.  Subjective:   Nicholas Caldwell denies chest pain, dyspnea, cough. Sitting up in the chair watching TV in no distress.  Objective:    Filed Vitals:   11/04/14 1430 11/04/14 2038 11/04/14 2123 11/05/14 0544  BP: 120/61 128/72 135/68 127/70  Pulse: 68 67 73 68  Temp: 98.1 F (36.7 C)  98 F (36.7 C) 98.5 F (36.9 C)  TempSrc: Oral  Oral Oral  Resp: _0 Height:      Weight:    118.48 kg (261 lb 3.2 oz)  SpO2: 98%  92% 91%    Intake/Output Summary (Last 24 hours) at 11/05/14 0802 Last data filed at 11/04/14 1830  Gross per 24 hour  Intake  1200 ml  Output   1151 ml  Net     49 ml    Exam: Gen:  NAD Cardiovascular:  RRR, No M/R/G Respiratory:  Lungs diminished but clear Gastrointestinal:  Abdomen soft, NT/ND, +  BS Extremities:  !+ edema   Data Reviewed:    Labs: Basic Metabolic Panel:  Recent Labs Lab 10/31/14 1535 10/31/14 2345 11/02/14 0503 11/04/14 0540 11/05/14 0500  NA 139  --  137 138 133*  K 4.0  --  4.0 3.4* 3.0*  CL 105  --  99 97 94*  CO2 27  --  _0 GLUCOSE 211*  --  219* 134* 202*  BUN 12  --  17 24* 25*  CREATININE 0.84 0.81 0.86 0.96 1.06  CALCIUM 8.8  --  9.0 8.8 8.7   GFR Estimated Creatinine Clearance: 95.1 mL/min (by C-G formula based on Cr of 1.06).  CBC:  Recent Labs Lab 10/31/14 1535 10/31/14 2345 11/04/14 0540  WBC 7.2 7.4 7.8  HGB 12.3* 12.7* 12.1*  HCT 39.8 39.6 38.9*  MCV 86.1 85.5 86.1  PLT 430* 394 429*   Cardiac Enzymes:  Recent Labs Lab 10/31/14 1602 10/31/14 2050 10/31/14 2345 11/01/14 0216 11/01/14 0540  TROPONINI 0.06* 0.05* 0.06* 0.05* 0.05*    CBG:  Recent Labs Lab 11/04/14 0809 11/04/14 1211 11/04/14 1708 11/04/14 2127 11/05/14 0740  GLUCAP 112* 186* 221* 146* 141*     Medications:   . aspirin  81 mg Oral Daily  . furosemide  20 mg Intravenous Once  . furosemide  40 mg Intravenous BID  . glipiZIDE  20 mg Oral BID AC  . heparin  5,000 Units Subcutaneous Q8H  . irbesartan  150 mg Oral BID   And  . hydrochlorothiazide  25 mg Oral Daily  . insulin aspart  0-15 Units Subcutaneous TID WC  . insulin aspart  0-5 Units Subcutaneous QHS  . metFORMIN  1,000 mg Oral BID WC  . methadone  10 mg Oral Q12H  . metoprolol tartrate  50 mg Oral BID  . potassium chloride  20 mEq Oral BID  . rosuvastatin  20 mg Oral Daily  . theophylline  200 mg Oral Daily  . tiotropium  18 mcg Inhalation Daily  . Vitamin D (Ergocalciferol)  50,000 Units Oral Q7 days   Continuous Infusions:   Time spent: 25 minutes.     LOS: 5 days   [facility] Pager [phone]. If unable to reach me by pager, please call my cell phone at [phone].  *Please refer to [email or web address], password [email or web address] to get updated schedule on  who will round on this patient, as hospitalists switch teams weekly. If 7PM-7AM, please contact night-coverage at [email or web address], password [email or web address] for any overnight needs.  11/05/2014, 8:02 AM

## 2014-11-06 ENCOUNTER — Encounter (HOSPITAL_COMMUNITY)
Admission: EM | Disposition: A | Discharge status: Home / Self Care | Payer: Self-pay | Source: Home / Self Care | Attending: Internal Medicine

## 2014-11-06 ENCOUNTER — Encounter (HOSPITAL_COMMUNITY): Payer: Self-pay | Admitting: Interventional Cardiology

## 2014-11-06 HISTORY — PX: LEFT AND RIGHT HEART CATHETERIZATION WITH CORONARY ANGIOGRAM: SHX5449

## 2014-11-06 LAB — GLUCOSE, CAPILLARY
GLUCOSE-CAPILLARY: 165 mg/dL — AB (ref 70–99)
GLUCOSE-CAPILLARY: 165 mg/dL — AB (ref 70–99)
Glucose-Capillary: 201 mg/dL — ABNORMAL HIGH (ref 70–99)
Glucose-Capillary: 154 mg/dL — ABNORMAL HIGH (ref 70–99)
Glucose-Capillary: 130 mg/dL — ABNORMAL HIGH (ref 70–99)
Glucose-Capillary: 149 mg/dL — ABNORMAL HIGH (ref 70–99)

## 2014-11-06 LAB — POCT I-STAT 3, ART BLOOD GAS (G3+)
Acid-Base Excess: 3 mmol/L — ABNORMAL HIGH (ref 0.0–2.0)
Bicarbonate: 28.4 mEq/L — ABNORMAL HIGH (ref 20.0–24.0)
O2 Saturation: 93 %
PCO2 ART: 44.7 mmHg (ref 35.0–45.0)
PO2 ART: 65 mmHg — AB (ref 80.0–100.0)
TCO2: 30 mmol/L (ref 0–100)
pH, Arterial: 7.412 (ref 7.350–7.450)

## 2014-11-06 LAB — POCT I-STAT 3, VENOUS BLOOD GAS (G3P V)
Acid-Base Excess: 5 mmol/L — ABNORMAL HIGH (ref 0.0–2.0)
Bicarbonate: 31.7 mEq/L — ABNORMAL HIGH (ref 20.0–24.0)
O2 SAT: 56 %
PCO2 VEN: 53.6 mmHg — AB (ref 45.0–50.0)
PH VEN: 7.38 — AB (ref 7.250–7.300)
PO2 VEN: 31 mmHg (ref 30.0–45.0)
TCO2: 33 mmol/L (ref 0–100)

## 2014-11-06 LAB — BASIC METABOLIC PANEL
Anion gap: 7 (ref 5–15)
BUN: 23 mg/dL (ref 6–23)
CHLORIDE: 96 meq/L (ref 96–112)
CO2: 30 mmol/L (ref 19–32)
Calcium: 8.7 mg/dL (ref 8.4–10.5)
Creatinine, Ser: 1 mg/dL (ref 0.50–1.35)
GFR calc Af Amer: 89 mL/min — ABNORMAL LOW (ref >90)
GFR calc non Af Amer: 76 mL/min — ABNORMAL LOW (ref >90)
GLUCOSE: 214 mg/dL — AB (ref 70–99)
POTASSIUM: 3.3 mmol/L — AB (ref 3.5–5.1)
Sodium: 133 mmol/L — ABNORMAL LOW (ref 135–145)

## 2014-11-06 LAB — POCT ACTIVATED CLOTTING TIME: Activated Clotting Time: 147 seconds

## 2014-11-06 LAB — PROTIME-INR
INR: 1.03 (ref 0.00–1.49)
Prothrombin Time: 13.6 seconds (ref 11.6–15.2)

## 2014-11-06 SURGERY — LEFT AND RIGHT HEART CATHETERIZATION WITH CORONARY ANGIOGRAM
Anesthesia: LOCAL

## 2014-11-06 MED ORDER — MIDAZOLAM HCL 2 MG/2ML IJ SOLN
INTRAMUSCULAR | Status: AC
  Filled 2014-11-06: qty 2

## 2014-11-06 MED ORDER — ACETAMINOPHEN 325 MG PO TABS: 650.0000 mg | ORAL_TABLET | ORAL | Status: DC | PRN

## 2014-11-06 MED ORDER — HEPARIN (PORCINE) IN NACL 2-0.9 UNIT/ML-% IJ SOLN
INTRAMUSCULAR | Status: AC
  Filled 2014-11-06: qty 1000

## 2014-11-06 MED ORDER — METFORMIN HCL 500 MG PO TABS: 1000.0000 mg | ORAL_TABLET | Freq: Two times a day (BID) | ORAL | Status: DC

## 2014-11-06 MED ORDER — FUROSEMIDE 10 MG/ML IJ SOLN
40.0000 mg | Freq: Two times a day (BID) | INTRAMUSCULAR | Status: DC
  Administered 2014-11-07: 40 mg via INTRAVENOUS
  Filled 2014-11-06 (×3): qty 4

## 2014-11-06 MED ORDER — HEPARIN SODIUM (PORCINE) 1000 UNIT/ML IJ SOLN
INTRAMUSCULAR | Status: AC
  Filled 2014-11-06: qty 1

## 2014-11-06 MED ORDER — POTASSIUM CHLORIDE CRYS ER 20 MEQ PO TBCR
40.0000 meq | EXTENDED_RELEASE_TABLET | Freq: Once | ORAL | Status: AC
  Administered 2014-11-06: 40 meq via ORAL
  Filled 2014-11-06: qty 2

## 2014-11-06 MED ORDER — SODIUM CHLORIDE 0.9 % IV SOLN: 1.0000 mL/kg/h | INTRAVENOUS | Status: AC

## 2014-11-06 MED ORDER — ONDANSETRON HCL 4 MG/2ML IJ SOLN: 4.0000 mg | Freq: Four times a day (QID) | INTRAMUSCULAR | Status: DC | PRN

## 2014-11-06 MED ORDER — ASPIRIN 81 MG PO CHEW: 81.0000 mg | CHEWABLE_TABLET | Freq: Every day | ORAL | Status: DC

## 2014-11-06 MED ORDER — FENTANYL CITRATE 0.05 MG/ML IJ SOLN
INTRAMUSCULAR | Status: AC
  Filled 2014-11-06: qty 2

## 2014-11-06 MED ORDER — LIDOCAINE HCL (PF) 1 % IJ SOLN
INTRAMUSCULAR | Status: AC
  Filled 2014-11-06: qty 30

## 2014-11-06 NOTE — Progress Notes (Signed)
Pt refused his heparin injection this am. Pt also did not want to take sliding scale insulin this am for blood glucose of 154. MD at bedside and she stated it was ok to hold the insulin. RN had already obtained a telephone order from MD to hold metformin and glipizide this am, due to pt being NPO for a Heart Cath today. Pt also did not want to take scheduled IV lasix this am due to his procedure. Charted Lasix as held and put a note on the [facility] for that med to be addressed after procedure. Pt given 41mq of potassium at this time and the extra one time dose that was ordered by MD was rescheduled for four hours later on MAR. Will continue to monitor pt.

## 2014-11-06 NOTE — Progress Notes (Signed)
 Subjective:  No chest pain; Breathing better  Objective:   Vital Signs in the last 24 hours: Temp:  [98.2 F (36.8 C)-98.9 F (37.2 C)] 98.9 F (37.2 C) (01/04 0624) Pulse Rate:  [65-75] 65 (01/04 0624) Resp:  [18-20] 20 (01/04 0624) BP: (121-125)/(67-83) 125/83 mmHg (01/04 0624) SpO2:  [92 %-100 %] 96 % (01/04 0624) Weight:  [261 lb 9.6 oz (118.661 kg)] 261 lb 9.6 oz (118.661 kg) (01/04 0624)   Weight 269 ==>261  Intake/Output from previous day: 01/03 0701 - 01/04 0700 In: 480 [P.O.:480] Out: 500 [Urine:500] Net: -20  I/O since admission: -616  Medications: . [START ON 11/07/2014] aspirin  81 mg Oral Daily  . furosemide  20 mg Intravenous Once  . furosemide  40 mg Intravenous BID  . glipiZIDE  20 mg Oral BID AC  . heparin  5,000 Units Subcutaneous Q8H  . irbesartan  150 mg Oral BID   And  . hydrochlorothiazide  25 mg Oral Daily  . insulin aspart  0-15 Units Subcutaneous TID WC  . insulin aspart  0-5 Units Subcutaneous QHS  . insulin aspart  4 Units Subcutaneous TID WC  . metFORMIN  1,000 mg Oral BID WC  . methadone  10 mg Oral Q12H  . metoprolol tartrate  50 mg Oral BID  . potassium chloride  40 mEq Oral BID  . rosuvastatin  20 mg Oral Daily  . sodium chloride  3 mL Intravenous Q12H  . theophylline  200 mg Oral Daily  . tiotropium  18 mcg Inhalation Daily  . Vitamin D (Ergocalciferol)  50,000 Units Oral Q7 days    . sodium chloride 75 mL/hr at 11/06/14 0351    Physical Exam:  General appearance: alert, cooperative and no distress; moderate obesity Neck: no adenopathy, no carotid bruit, no JVD, supple, symmetrical, trachea midline and thyroid not enlarged, symmetric, no tenderness/mass/nodules Lungs: Decreased breath sounds without audible wheezes Chest wall: Nontender to palpation Heart: regular rate and rhythm; 1/6 systolic murmur Abdomen: Moderate central adiposity; soft, non-tender; bowel sounds normal; no masses, no organomegaly Back: No CVA  tenderness Extremities: Trace pretibial edema bilaterally; not significantly changed Pulses: 2+ and symmetric Skin: Skin color, texture, turgor normal. No rashes or lesions Neurologic: Alert and oriented X 3, normal strength and tone. Normal symmetric reflexes. Normal coordination and gait  Psychological: Normal affect and mood   Rate: 65  Rhythm: normal sinus rhythm  ECG (independently read by me): NSR at 98, LVH   Lab Results:  BMP Latest Ref Rng 11/06/2014 11/05/2014 11/04/2014  Glucose 70 - 99 mg/dL 214(H) 202(H) 134(H)  BUN 6 - 23 mg/dL 23 25(H) 24(H)  Creatinine 0.50 - 1.35 mg/dL 1.00 1.06 0.96  Sodium 135 - 145 mmol/L 133(L) 133(L) 138  Potassium 3.5 - 5.1 mmol/L 3.3(L) 3.0(L) 3.4(L)  Chloride 96 - 112 mEq/L 96 94(L) 97  CO2 19 - 32 mmol/L 30 31 31  Calcium 8.4 - 10.5 mg/dL 8.7 8.7 8.8     CBC Latest Ref Rng 11/04/2014 10/31/2014 10/31/2014  WBC 4.0 - 10.5 K/uL 7.8 7.4 7.2  Hemoglobin 13.0 - 17.0 g/dL 12.1(L) 12.7(L) 12.3(L)  Hematocrit 39.0 - 52.0 % 38.9(L) 39.6 39.8  Platelets 150 - 400 K/uL 429(H) 394 430(H)     No results for input(s): TROPONINI in the last 72 hours.  Invalid input(s): CK, MB  Hepatic Function Panel No results for input(s): PROT, ALBUMIN, AST, ALT, ALKPHOS, BILITOT, BILIDIR, IBILI in the last 72 hours.  Recent Labs    11/06/14 0359  INR 1.03     BNP (last 3 results) 563 (not pro-BNP)    Lipid Panel     Component Value Date/Time   CHOL 147 11/01/2014 0216   TRIG 134 11/01/2014 0216   HDL 48 11/01/2014 0216   CHOLHDL 3.1 11/01/2014 0216   VLDL 27 11/01/2014 0216   LDLCALC 72 11/01/2014 0216      Imaging:  No results found. ECHO Study Conclusions  - Left ventricle: The cavity size was mildly dilated. Wall thickness was increased in a pattern of mild LVH. Systolic function was severely reduced. The estimated ejection fraction was in the range of 25% to 30%. Diffuse hypokinesis. - Ventricular septum: Septal motion showed  paradox. - Mitral valve: There was mild regurgitation. - Left atrium: The atrium was moderately dilated. - Right atrium: The atrium was moderately dilated.  Impressions:  - Patient is significantly tachycardic 130-140 throughout the study.   Assessment/Plan:   Principal Problem:   Chest pain Active Problems:   Hypertension   Diabetes mellitus type 2 in obese   Hyperlipidemia associated with type 2 diabetes mellitus   Smoker   Elevated troponin I level   COPD exacerbation   Acute systolic CHF (congestive heart failure)   Pain in the chest   Dyspnea   Hypokalemia   1. Acute on chronic probable combined CHF; EF 25-30% on echo yesterday. Continue diuresis, ARB; may benefit from aldosterone blockade but will not start today. BNP elevated at 563 2. Ischemic cardiomyopathy with inferoapical scar EF 32% on nuclear study.  3. Mildly elevated troponin ? CHF mediated 4. DM2; not well controlled 5. Essential HTN: Now on irbesartan 150 mg bid, HCTZ, metoprolol.  6. Hyperlipidemia: Currently on Crestor 20 mg. Recent lipid panel reviewed. LDL at 72 7. COPD 8. Tobacco history  Plan for R and L heart cath today. Again discussed echo and nuclear findings. K 3.3 will give 40 meq orally now.  Tolerating metoprolol to 50 mg bid. If NICM, consider adding aldosetrone blockade.   Thomas A. Kelly, MD, FACC 11/06/2014, 7:44 AM 

## 2014-11-06 NOTE — Progress Notes (Addendum)
Progress Note   [name] XKP:[id number] DOB: 1945-12-27 DOA: 10/31/2014 PCP: PROVIDER NOT IN SYSTEM   Brief Narrative:   Nicholas Caldwell is an 69 y.o. male with multiple cardiac risk factors including a history of hypertension, diabetes, obesity, and hyperlipidemia as well as a history of tobacco abuse was admitted 10/31/14 the one month history of intermittent chest pain. Troponins were elevated and 2-D echo showed significant systolic dysfunction. He also had a high risk stress test. Cardiology is following the patient with plans for further evaluation of cardiac function/status with a heart catheterization.  Assessment/Plan:   Principal Problem:   Chest pain with elevated troponin I level -Continue aspirin and statin therapy. Metoprolol added 11/01/14. -Seen by cardiology, s/p stress test 11/02/14 with findings of inferoapical scar/high risk findings.  Cath scheduled for 11/06/14. -Repeat CXR done 11/03/14 to evaluate for underlying pneumonia given patient's recent treatment of pneumonia (afebrile with normal WBC): no pneumonia noted.  Active Problems:   Acute systolic CHF/ Dyspnea  -Admission Chest x-ray showed findings consistent with pulmonary edema. ProBNP 563.9--->363.6. -2-D echocardiogram done 11/01/14, EF 25-30 percent. -Continue to diurese. Increase Lasix to 40 mg BID given ongoing dyspnea.  Weight down approximately 8 pounds.  -Cath scheduled 11/06/14.    Hypokalemia -Secondary to Lasix.  Increase potassium supplementation.    Hypertension -Currently controlled. Continue Avapro/HCTZ.    Diabetes mellitus type 2 in obese -Seen by diabetes coordinator 11/01/14, however the patient was not receptive to education and was hostile. -CBGs 141-200. Continue Glucotrol and metformin.  Continue moderate scale SSI Q AC/HS with meal coverage. -Understands the importance of getting his A1c down and has been cooperative with insulin therapy.     Hyperlipidemia associated with type  2 diabetes mellitus -Lipids currently controlled on Crestor.    Smoker -Tobacco cessation counseling performed.    COPD -Continue Spiriva and theophylline.  No evidence of bronchospasm.  Off steroids.    DVT Prophylaxis - Continue Heparin.  Code Status: Full. Family Communication: Wife updated at the bedside. Disposition Plan: Home when stable.   IV Access:    Peripheral IV   Procedures and diagnostic studies:   Dg Chest 2 View 10/31/2014: Stable cardiomegaly with stable central pulmonary vascular congestion. Stable interstitial densities are noted throughout both lungs which may represent scarring, but superimposed pulmonary edema cannot be excluded.    2-D echocardiogram 11/01/14: EF 25-30 percent. Diffuse hypokinesis. Paradoxical movement of ventricular septum.  Nm Myocar Multi W/spect W/wall Motion / Ef 11/02/2014: 1. Inferoapical scar/infarction. No findings for myocardial ischemia.  2. Mild global hypokinesis and no contraction or wall thickening in the area of scar.  3. Left ventricular ejection fraction 32%  4. High-risk stress test findings*.  *2012 Appropriate Use Criteria for Coronary Revascularization Focused Update: J Am Coll Cardiol. 8675;44(9):201-007. http://content.airportbarriers.com.aspx?articleid=1201161      Medical Consultants:    Dr. Shelva Majestic, Cardiology  Anti-Infectives:    None.  Subjective:   Nicholas Caldwell denies chest pain, dyspnea, cough. Sitting up in the chair. Mildly anxious about his procedure scheduled for today.  Objective:    Filed Vitals:   11/05/14 1137 11/05/14 1449 11/05/14 2150 11/06/14 0624  BP:  125/67 121/72 125/83  Pulse:  74 75 65  Temp:  98.2 F (36.8 C) 98.6 F (37 C) 98.9 F (37.2 C)  TempSrc:  Oral Oral Oral  Resp:  _0 Height:      Weight:    118.661 kg (261 lb 9.6 oz)  SpO2: 100% 92% 95% 96%    Intake/Output Summary (Last 24 hours) at 11/06/14 0800 Last data filed at 11/05/14 1700  Gross  per 24 hour  Intake    480 ml  Output    500 ml  Net    -20 ml    Exam: Gen:  NAD Cardiovascular:  RRR, No M/R/G Respiratory:  Lungs diminished but clear Gastrointestinal:  Abdomen soft, NT/ND, + BS Extremities:  !+ edema   Data Reviewed:    Labs: Basic Metabolic Panel:  Recent Labs Lab 10/31/14 1535 10/31/14 2345 11/02/14 0503 11/04/14 0540 11/05/14 0500 11/06/14 0359  NA 139  --  137 138 133* 133*  K 4.0  --  4.0 3.4* 3.0* 3.3*  CL 105  --  99 97 94* 96  CO2 27  --  _0 GLUCOSE 211*  --  219* 134* 202* 214*  BUN 12  --  17 24* 25* 23  CREATININE 0.84 0.81 0.86 0.96 1.06 1.00  CALCIUM 8.8  --  9.0 8.8 8.7 8.7   GFR Estimated Creatinine Clearance: 100.9 mL/min (by C-G formula based on Cr of 1).  CBC:  Recent Labs Lab 10/31/14 1535 10/31/14 2345 11/04/14 0540  WBC 7.2 7.4 7.8  HGB 12.3* 12.7* 12.1*  HCT 39.8 39.6 38.9*  MCV 86.1 85.5 86.1  PLT 430* 394 429*   Cardiac Enzymes:  Recent Labs Lab 10/31/14 1602 10/31/14 2050 10/31/14 2345 11/01/14 0216 11/01/14 0540  TROPONINI 0.06* 0.05* 0.06* 0.05* 0.05*    CBG:  Recent Labs Lab 11/05/14 0740 11/05/14 1159 11/05/14 1645 11/05/14 2148 11/06/14 0742  GLUCAP 141* 200* 159* 166* 154*     Medications:   . [START ON 11/07/2014] aspirin  81 mg Oral Daily  . furosemide  20 mg Intravenous Once  . furosemide  40 mg Intravenous BID  . glipiZIDE  20 mg Oral BID AC  . heparin  5,000 Units Subcutaneous Q8H  . irbesartan  150 mg Oral BID   And  . hydrochlorothiazide  25 mg Oral Daily  . insulin aspart  0-15 Units Subcutaneous TID WC  . insulin aspart  0-5 Units Subcutaneous QHS  . insulin aspart  4 Units Subcutaneous TID WC  . metFORMIN  1,000 mg Oral BID WC  . methadone  10 mg Oral Q12H  . metoprolol tartrate  50 mg Oral BID  . potassium chloride  40 mEq Oral BID  . potassium chloride  40 mEq Oral Once  . rosuvastatin  20 mg Oral Daily  . sodium chloride  3 mL Intravenous Q12H  .  theophylline  200 mg Oral Daily  . tiotropium  18 mcg Inhalation Daily  . Vitamin D (Ergocalciferol)  50,000 Units Oral Q7 days   Continuous Infusions: . sodium chloride 75 mL/hr at 11/06/14 0351    Time spent: 25 minutes.     LOS: 6 days   [facility] Pager [phone]. If unable to reach me by pager, please call my cell phone at [phone].  *Please refer to [email or web address], password [email or web address] to get updated schedule on who will round on this patient, as hospitalists switch teams weekly. If 7PM-7AM, please contact night-coverage at [email or web address], password [email or web address] for any overnight needs.  11/06/2014, 8:00 AM

## 2014-11-06 NOTE — Plan of Care (Signed)
Problem: Phase I Progression Outcomes Goal: EF % per last Echo/documented,Core Reminder form on chart Outcome: Completed/Met Date Met:  11/06/14 EF=25-30%

## 2014-11-06 NOTE — Progress Notes (Signed)
Site area: right brachial venous sheath was removed  Site Prior to Removal:  Level 0  Pressure Applied For 10 MINUTES    Minutes Beginning at 1545p  Manual:   Yes.    Patient Status During Pull:  stable  Post Pull Groin Site:  Level 0  Post Pull Instructions Given:  Yes.    Post Pull Pulses Present:  Yes.    Dressing Applied:  Yes.    Comments:  VS  Remain stable thru sheath pull.  Pt denies any discomfort at this time

## 2014-11-06 NOTE — CV Procedure (Signed)
PROCEDURE:  Left and right heart catheterization with selective coronary angiography, left ventriculogram.  INDICATIONS:   Acute systolic heart failure, cardiomyopathy  The risks, benefits, and details of the procedure were explained to the patient.  The patient verbalized understanding and wanted to proceed.  Informed written consent was obtained.  PROCEDURE TECHNIQUE:   After Xylocaine anesthesia, a 5 French brachial sheath was exchanged for a peripheral IV in the right antecubital space.  After Xylocaine anesthesia a 75F slender sheath was placed in the right radial artery with a single anterior needle wall stick.    A balloon-tipped 5 French Swan-Ganz catheter was advanced to the pulmonary artery under fluoroscopic guidance. Saturations were obtained. Hemodynamic assessment was performed.IV Heparin was given  After obtaining access to the ascending aorta.  Right coronary angiography was done using a Judkins R4 guide catheter.  Left coronary angiography was done using a Judkins L3.5 guide catheter.  Left ventriculography was done using a pigtail catheter.  A TR band was used for hemostasis.   CONTRAST:  Total of 100 cc.  COMPLICATIONS:  None.    HEMODYNAMICS:  Aortic pressure was 129/70; LV pressure was 128/4; LVEDP 26.  There was no gradient between the left ventricle and aorta.   Right atrial pressure 11/7, mean right atrial pressure 7 mmHg;  RV pressure 54/6, RVEDP 9 mmHg;  Pulmonary artery pressure 54/20, mean PA pressure 33 mmHg;   Pulmonary capillary wedge pressure 22/21, mean pulmonary capillary wedge pressure 20 mmHg.  Aortic saturation 93%. PA saturation 56%. Cardiac output 5.3 L/m. Cardiac index 2.2.  ANGIOGRAPHIC DATA:   The left main coronary artery is widely patent.  The left anterior descending artery is  A large vessel which wraps around the apex. In the mid vessel, there is a area of moderate , diffuse disease. This area is just after the origin of a medium size diagonal  vessel. The Remainder of the LAD appears widely patent.  The left circumflex artery is  A large vessel. There is mild disease in the proximal to mid circumflex.   There is a medium size ramus vessel which is widely patent. There are several small obtuse marginal vessels which are patent. The third obtuse marginal is small in caliber but long in length. There does appear to be some distal vessel disease but it is too small to be intervened upon. It is also not supplying a large area of myocardium. There is a large OM for which appears widely patent.  The right coronary artery is a Nondominant vessel which appears patent.  LEFT VENTRICULOGRAM:  Left ventricular angiogram was done in the 30 RAO projection and revealed normal left ventricular wall motion and systolic function with an estimated ejection fraction of 25-30%.  LVEDP was 26 mmHg.  IMPRESSIONS:  1.  Mild, diffuse, nonobstructive coronary artery disease.   2.  Severely decreased left ventricular systolic function.  LVEDP 26 mmHg.  Ejection fraction  25-30%. 3.    Mild pulmonary artery hypertension.  Aortic saturation 93%. PA saturation 56%. Cardiac output 5.3 L/m. Cardiac index 2.2.  RECOMMENDATION:   Apparent nonischemic cardiomyopathy. Continue aggressive medical therapy and risk factor modification.   Cardiology follow-up with Dr. Claiborne Billings.

## 2014-11-06 NOTE — H&P (View-Only) (Signed)
Subjective:  No chest pain; Breathing better  Objective:   Vital Signs in the last 24 hours: Temp:  [98.2 F (36.8 C)-98.9 F (37.2 C)] 98.9 F (37.2 C) (01/04 0624) Pulse Rate:  [65-75] 65 (01/04 0624) Resp:  [18-20] 20 (01/04 0624) BP: (121-125)/(67-83) 125/83 mmHg (01/04 0624) SpO2:  [92 %-100 %] 96 % (01/04 0624) Weight:  [261 lb 9.6 oz (118.661 kg)] 261 lb 9.6 oz (118.661 kg) (01/04 0624)   Weight 269 ==>261  Intake/Output from previous day: 01/03 0701 - 01/04 0700 In: 480 [P.O.:480] Out: 500 [Urine:500] Net: -20  I/O since admission: -616  Medications: . [START ON 11/07/2014] aspirin  81 mg Oral Daily  . furosemide  20 mg Intravenous Once  . furosemide  40 mg Intravenous BID  . glipiZIDE  20 mg Oral BID AC  . heparin  5,000 Units Subcutaneous Q8H  . irbesartan  150 mg Oral BID   And  . hydrochlorothiazide  25 mg Oral Daily  . insulin aspart  0-15 Units Subcutaneous TID WC  . insulin aspart  0-5 Units Subcutaneous QHS  . insulin aspart  4 Units Subcutaneous TID WC  . metFORMIN  1,000 mg Oral BID WC  . methadone  10 mg Oral Q12H  . metoprolol tartrate  50 mg Oral BID  . potassium chloride  40 mEq Oral BID  . rosuvastatin  20 mg Oral Daily  . sodium chloride  3 mL Intravenous Q12H  . theophylline  200 mg Oral Daily  . tiotropium  18 mcg Inhalation Daily  . Vitamin D (Ergocalciferol)  50,000 Units Oral Q7 days    . sodium chloride 75 mL/hr at 11/06/14 0351    Physical Exam:  General appearance: alert, cooperative and no distress; moderate obesity Neck: no adenopathy, no carotid bruit, no JVD, supple, symmetrical, trachea midline and thyroid not enlarged, symmetric, no tenderness/mass/nodules Lungs: Decreased breath sounds without audible wheezes Chest wall: Nontender to palpation Heart: regular rate and rhythm; 1/6 systolic murmur Abdomen: Moderate central adiposity; soft, non-tender; bowel sounds normal; no masses, no organomegaly Back: No CVA  tenderness Extremities: Trace pretibial edema bilaterally; not significantly changed Pulses: 2+ and symmetric Skin: Skin color, texture, turgor normal. No rashes or lesions Neurologic: Alert and oriented X 3, normal strength and tone. Normal symmetric reflexes. Normal coordination and gait  Psychological: Normal affect and mood   Rate: 65  Rhythm: normal sinus rhythm  ECG (independently read by me): NSR at 98, LVH   Lab Results:  BMP Latest Ref Rng 11/06/2014 11/05/2014 11/04/2014  Glucose 70 - 99 mg/dL 214(H) 202(H) 134(H)  BUN 6 - 23 mg/dL 23 25(H) 24(H)  Creatinine 0.50 - 1.35 mg/dL 1.00 1.06 0.96  Sodium 135 - 145 mmol/L 133(L) 133(L) 138  Potassium 3.5 - 5.1 mmol/L 3.3(L) 3.0(L) 3.4(L)  Chloride 96 - 112 mEq/L 96 94(L) 97  CO2 19 - 32 mmol/L _0 Calcium 8.4 - 10.5 mg/dL 8.7 8.7 8.8     CBC Latest Ref Rng 11/04/2014 10/31/2014 10/31/2014  WBC 4.0 - 10.5 K/uL 7.8 7.4 7.2  Hemoglobin 13.0 - 17.0 g/dL 12.1(L) 12.7(L) 12.3(L)  Hematocrit 39.0 - 52.0 % 38.9(L) 39.6 39.8  Platelets 150 - 400 K/uL 429(H) 394 430(H)     No results for input(s): TROPONINI in the last 72 hours.  Invalid input(s): CK, MB  Hepatic Function Panel No results for input(s): PROT, ALBUMIN, AST, ALT, ALKPHOS, BILITOT, BILIDIR, IBILI in the last 72 hours.  Recent Labs  11/06/14 0359  INR 1.03     BNP (last 3 results) 563 (not pro-BNP)    Lipid Panel     Component Value Date/Time   CHOL 147 11/01/2014 0216   TRIG 134 11/01/2014 0216   HDL 48 11/01/2014 0216   CHOLHDL 3.1 11/01/2014 0216   VLDL 27 11/01/2014 0216   LDLCALC 72 11/01/2014 0216      Imaging:  No results found. ECHO Study Conclusions  - Left ventricle: The cavity size was mildly dilated. Wall thickness was increased in a pattern of mild LVH. Systolic function was severely reduced. The estimated ejection fraction was in the range of 25% to 30%. Diffuse hypokinesis. - Ventricular septum: Septal motion showed  paradox. - Mitral valve: There was mild regurgitation. - Left atrium: The atrium was moderately dilated. - Right atrium: The atrium was moderately dilated.  Impressions:  - Patient is significantly tachycardic 130-140 throughout the study.   Assessment/Plan:   Principal Problem:   Chest pain Active Problems:   Hypertension   Diabetes mellitus type 2 in obese   Hyperlipidemia associated with type 2 diabetes mellitus   Smoker   Elevated troponin I level   COPD exacerbation   Acute systolic CHF (congestive heart failure)   Pain in the chest   Dyspnea   Hypokalemia   1. Acute on chronic probable combined CHF; EF 25-30% on echo yesterday. Continue diuresis, ARB; may benefit from aldosterone blockade but will not start today. BNP elevated at 563 2. Ischemic cardiomyopathy with inferoapical scar EF 32% on nuclear study.  3. Mildly elevated troponin ? CHF mediated 4. DM2; not well controlled 5. Essential HTN: Now on irbesartan 150 mg bid, HCTZ, metoprolol.  6. Hyperlipidemia: Currently on Crestor 20 mg. Recent lipid panel reviewed. LDL at 72 7. COPD 8. Tobacco history  Plan for R and L heart cath today. Again discussed echo and nuclear findings. K 3.3 will give 40 meq orally now.  Tolerating metoprolol to 50 mg bid. If NICM, consider adding aldosetrone blockade.   Troy Sine, MD, [facility] 11/06/2014, 7:44 AM

## 2014-11-06 NOTE — Interval H&P Note (Signed)
Cath Lab Visit (complete for each Cath Lab visit)  Clinical Evaluation Leading to the Procedure:   ACS: Yes.    Non-ACS:    Anginal Classification: CCS IV  Anti-ischemic medical therapy: Minimal Therapy (1 class of medications)  Non-Invasive Test Results: High-risk stress test findings: cardiac mortality >3%/year  Prior CABG: No previous CABG   TIMI SCORE  Patient Information:  TIMI Score is 4  UA/NSTEMI and intermediate-risk features (e.g., TIMI score 3?4) for short-term risk of death or nonfatal MI  Revascularization of the presumed culprit artery   A (8)  Indication: 10; Score: 8   History and Physical Interval Note:  11/06/2014 2:27 PM  Nicholas Caldwell  has presented today for surgery, with the diagnosis of Chest pain  The various methods of treatment have been discussed with the patient and family. After consideration of risks, benefits and other options for treatment, the patient has consented to  Procedure(s): LEFT AND RIGHT HEART CATHETERIZATION WITH CORONARY ANGIOGRAM (N/A) as a surgical intervention .  The patient's history has been reviewed, patient examined, no change in status, stable for surgery.  I have reviewed the patient's chart and labs.  Questions were answered to the patient's satisfaction.     VARANASI,JAYADEEP S.

## 2014-11-07 LAB — BASIC METABOLIC PANEL
Anion gap: 9 (ref 5–15)
BUN: 18 mg/dL (ref 6–23)
CO2: 28 mmol/L (ref 19–32)
CREATININE: 0.95 mg/dL (ref 0.50–1.35)
Calcium: 8.7 mg/dL (ref 8.4–10.5)
Chloride: 101 mEq/L (ref 96–112)
GFR calc Af Amer: >90 mL/min (ref >90)
GFR calc non Af Amer: 85 mL/min — ABNORMAL LOW (ref >90)
GLUCOSE: 142 mg/dL — AB (ref 70–99)
Potassium: 4.3 mmol/L (ref 3.5–5.1)
Sodium: 138 mmol/L (ref 135–145)

## 2014-11-07 LAB — GLUCOSE, CAPILLARY
GLUCOSE-CAPILLARY: 178 mg/dL — AB (ref 70–99)
Glucose-Capillary: 135 mg/dL — ABNORMAL HIGH (ref 70–99)

## 2014-11-07 LAB — BRAIN NATRIURETIC PEPTIDE: B Natriuretic Peptide: 388.8 pg/mL — ABNORMAL HIGH (ref 0.0–100.0)

## 2014-11-07 MED ORDER — FUROSEMIDE 40 MG PO TABS: 40.0000 mg | ORAL_TABLET | Freq: Every day | ORAL | Status: DC

## 2014-11-07 MED ORDER — CARVEDILOL 12.5 MG PO TABS
12.5000 mg | ORAL_TABLET | Freq: Two times a day (BID) | ORAL | Status: DC
  Filled 2014-11-07 (×2): qty 1

## 2014-11-07 MED ORDER — POTASSIUM CHLORIDE CRYS ER 20 MEQ PO TBCR: 40.0000 meq | EXTENDED_RELEASE_TABLET | Freq: Every day | ORAL | Status: DC

## 2014-11-07 MED ORDER — CARVEDILOL 12.5 MG PO TABS: 12.5000 mg | ORAL_TABLET | Freq: Two times a day (BID) | ORAL | Status: DC

## 2014-11-07 MED ORDER — ASPIRIN 81 MG PO CHEW: 81.0000 mg | CHEWABLE_TABLET | Freq: Every day | ORAL | Status: DC

## 2014-11-07 MED ORDER — ALBUTEROL SULFATE HFA 108 (90 BASE) MCG/ACT IN AERS: 2.0000 | INHALATION_SPRAY | Freq: Four times a day (QID) | RESPIRATORY_TRACT | Status: DC | PRN

## 2014-11-07 NOTE — Progress Notes (Signed)
Medicare Important Message given? YES  (If response is "NO", the following Medicare IM given date fields will be blank)  Date Medicare IM given: 11/07/14 Medicare IM given by:  Dahlia Client [employer]

## 2014-11-07 NOTE — Discharge Summary (Signed)
Physician Discharge Summary  Nicholas Caldwell WGN:[id number] DOB: 06-22-1946 DOA: 10/31/2014  PCP: [facility]ER NOT IN SYSTEM  Admit date: 10/31/2014 Discharge date: 11/07/2014  Time spent: >35 minutes  minutes  Recommendations for Outpatient Follow-up:  PCP in 1 week Heart failure clinic in 3-5 days  Discharge Diagnoses:  Principal Problem:   Chest pain Active Problems:   Hypertension   Diabetes mellitus type 2 in obese   Hyperlipidemia associated with type 2 diabetes mellitus   Smoker   Elevated troponin I level   COPD exacerbation   Acute systolic CHF (congestive heart failure)   Pain in the chest   Dyspnea   Hypokalemia   Discharge Condition: stable   Diet recommendation: low sodium,. DM   Filed Weights   11/05/14 0544 11/06/14 0624 11/07/14 0628  Weight: 118.48 kg (261 lb 3.2 oz) 118.661 kg (261 lb 9.6 oz) 117.8 kg (259 lb 11.2 oz)    History of present illness:  69 y/o male with multiple cardiac risk factors including a history of hypertension, diabetes, obesity, and hyperlipidemia as well as a history of tobacco abuse was admitted 10/31/14 the one month history of intermittent chest pain. Troponins were elevated and 2-D echo showed significant systolic dysfunction. He also had a high risk stress test. Cardiology is following the patient with plans for further evaluation of cardiac function/status with a heart catheterization.   Hospital Course:   1. Chest pain with elevated troponin I level; no new chest pains;  -s/p LHC: non obstructive CAD -Continue aspirin and statin therapy, BB 2. Acute systolic CHF; LVEF EF 78-46 percent -improved on IV diuretics, transitioned to PO; cotn ACE, BB; outpatient follow up for further medication titration  3. Hypokalemia, secondary to Lasix cont potassium supplementation; recheck in 1 week  4. Hypertension; Currently controlled. 5. Diabetes mellitus type 2 in obese -Seen by diabetes coordinator 11/01/14, however the patient was not  receptive to education and was hostile. -CBGs 141-200. Continue Glucotrol and metformin.  Understands the importance of getting his A1c down and has been cooperative with insulin therapy. 6. Smoker Tobacco cessation counseling performed. 7. COPD Continue bronchodilators, theophylline.  No evidence of bronchospasm.  Off steroids.  D/ w patient, her requesting to be discharged; recommended to f/u  Procedures:  LHC (i.e. Studies not automatically included, echos, thoracentesis, etc; not x-rays)  Consultations:  Cardiology   Discharge Exam: Filed Vitals:   11/07/14 0628  BP: 126/73  Pulse: 73  Temp: 98.1 F (36.7 C)  Resp: 18    General: alert Cardiovascular: s1,s2 rrr Respiratory: CTA BL  Discharge Instructions  Discharge Instructions    Diet - low sodium heart healthy    Complete by:  As directed      Discharge instructions    Complete by:  As directed   Please follow up with heart failure clinic next week     Increase activity slowly    Complete by:  As directed             Medication List    STOP taking these medications        celecoxib 200 MG capsule  Commonly known as:  CELEBREX     levofloxacin 250 MG tablet  Commonly known as:  LEVAQUIN      TAKE these medications        albuterol 108 (90 BASE) MCG/ACT inhaler  Commonly known as:  PROVENTIL HFA;VENTOLIN HFA  Inhale 2 puffs into the lungs every 6 (six) hours as needed for wheezing or  shortness of breath.     aspirin 81 MG chewable tablet  Chew 1 tablet (81 mg total) by mouth daily.     benzonatate 100 MG capsule  Commonly known as:  TESSALON  Take 1 capsule (100 mg total) by mouth every 8 (eight) hours.     carvedilol 12.5 MG tablet  Commonly known as:  COREG  Take 1 tablet (12.5 mg total) by mouth 2 (two) times daily with a meal.     furosemide 40 MG tablet  Commonly known as:  LASIX  Take 1 tablet (40 mg total) by mouth daily.     glipiZIDE 10 MG tablet  Commonly known as:  GLUCOTROL   Take 20 mg by mouth 2 (two) times daily before a meal.     metFORMIN 1000 MG tablet  Commonly known as:  GLUCOPHAGE  Take 1,000 mg by mouth 2 (two) times daily with a meal.     methadone 10 MG tablet  Commonly known as:  DOLOPHINE  Take 10 mg by mouth every 12 (twelve) hours.     oxyCODONE-acetaminophen 10-325 MG per tablet  Commonly known as:  PERCOCET  Take 1 tablet by mouth every 6 (six) hours as needed for pain (pain).     oxyCODONE-acetaminophen 5-325 MG per tablet  Commonly known as:  PERCOCET/ROXICET  Take 1 tablet by mouth every 6 (six) hours as needed for moderate pain or severe pain (pain).     potassium chloride SA 20 MEQ tablet  Commonly known as:  K-DUR,KLOR-CON  Take 2 tablets (40 mEq total) by mouth daily.     rosuvastatin 20 MG tablet  Commonly known as:  CRESTOR  Take 20 mg by mouth daily.     theophylline 400 MG 24 hr tablet  Commonly known as:  UNIPHYL  Take 200 mg by mouth daily.     TRIBENZOR PO  Take 1 tablet by mouth daily.     Vitamin D (Ergocalciferol) 50000 UNITS Caps capsule  Commonly known as:  DRISDOL  Take 50,000 Units by mouth every 7 (seven) days. On Sunday.       No Known Allergies     Follow-up Information    Follow up with [facility] NOT IN SYSTEM.      Follow up with Dorris Carnes, MD. Schedule an appointment as soon as possible for a visit in 1 week.   Specialty:  Cardiology   Contact information:   [place] [address] [phone]       Follow up with [facility]. Schedule an appointment as soon as possible for a visit in 5 days.   Specialty:  Cardiology   Contact information:   [address] [id number] [place] [phone]       The results of significant diagnostics from this hospitalization (including imaging, microbiology, ancillary and laboratory) are listed below for reference.    Significant  Diagnostic Studies: Dg Chest 2 View  10/31/2014   CLINICAL DATA:  Dyspnea.  EXAM: CHEST  2 VIEW  COMPARISON:  October 26, 2014.  FINDINGS: Stable cardiomegaly with central pulmonary vascular congestion. Stable interstitial densities are noted throughout both lungs which may represent scarring, but superimposed pulmonary edema cannot be excluded. No pneumothorax is noted. Minimal bilateral pleural effusions are noted. Bony thorax is intact.  IMPRESSION: Stable cardiomegaly with stable central pulmonary vascular congestion. Stable interstitial densities are noted throughout both lungs which may represent scarring, but  superimposed pulmonary edema cannot be excluded.   Electronically Signed   By: Sabino Dick M.D.   On: 10/31/2014 16:36   Nm Myocar Multi W/spect W/wall Motion / Ef  11/02/2014   CLINICAL DATA:  Chest pain.  History of hypertension.  EXAM: MYOCARDIAL IMAGING WITH SPECT (REST AND PHARMACOLOGIC-STRESS)  GATED LEFT VENTRICULAR WALL MOTION STUDY  LEFT VENTRICULAR EJECTION FRACTION  TECHNIQUE: Standard myocardial SPECT imaging was performed after resting intravenous injection of 10 mCi Tc-41msestamibi. Subsequently, intravenous infusion of Lexiscan was performed under the supervision of the Cardiology staff. At peak effect of the drug, 30 mCi Tc-913mestamibi was injected intravenously and standard myocardial SPECT imaging was performed. Quantitative gated imaging was also performed to evaluate left ventricular wall motion, and estimate left ventricular ejection fraction.  COMPARISON:  None.  FINDINGS: Perfusion: Moderate area of decreased activity in the inferoapical region consistent with scar/infarction.  Wall Motion: Mild global hypokinesis and no contraction or wall thickening in the inferoapical region.  Left Ventricular Ejection Fraction: 32 %  End diastolic volume 26349l  End systolic volume 17179l  IMPRESSION: 1. Inferoapical scar/infarction. No findings for myocardial ischemia.  2. Mild  global hypokinesis and no contraction or wall thickening in the area of scar.  3. Left ventricular ejection fraction 32%  4. High-risk stress test findings*.  *2012 Appropriate Use Criteria for Coronary Revascularization Focused Update: J Am Coll Cardiol. 201505;69(7):[id number]://content.[email or web address]?articleid=[email or web address]   Electronically Signed   By: MaKalman Jewels.D.   On: 11/02/2014 14:21   Dg Chest Port 1 View  11/03/2014   CLINICAL DATA:  Heart failure.  Shortness of breath, cough.  EXAM: PORTABLE CHEST - 1 VIEW  COMPARISON:  10/31/2014  FINDINGS: Cardiomegaly with vascular congestion and diffuse interstitial prominence. I favor this represents interstitial edema although a component of underlying chronic interstitial lung disease is possible. No visible effusions or acute bony abnormality.  IMPRESSION: Cardiomegaly with stable diffuse interstitial prominence concerning for interstitial edema. Cannot exclude underlying chronic interstitial lung disease.   Electronically Signed   By: KeRolm Baptise.D.   On: 11/03/2014 11:12   Dg Chest Port 1 View  10/26/2014   CLINICAL DATA:  Shortness of breath, hypertension, diabetes, smoker  EXAM: PORTABLE CHEST - 1 VIEW  COMPARISON:  Portable exam 2006 hr compared to 03/27/2005  FINDINGS: Enlargement of cardiac silhouette.  Atherosclerotic calcification of a mildly tortuous thoracic aorta.  Slight pulmonary vascular congestion.  Scattered interstitial infiltrates new since previous exam question edema versus infection.  No pleural effusion or pneumothorax.  Scattered endplate spur formation thoracic spine.  IMPRESSION: Enlargement of cardiac silhouette with pulmonary vascular congestion and scattered interstitial infiltrates which could represent pulmonary edema or infection, new since previous study.   Electronically Signed   By: MaLavonia Dana.D.   On: 10/26/2014 20:23    Microbiology: No results found for this or any previous visit (from the past  240 hour(s)).   Labs: Basic Metabolic Panel:  Recent Labs Lab 11/02/14 0503 11/04/14 0540 11/05/14 0500 11/06/14 0359 11/07/14 0500  NA 137 138 133* 133* 138  K 4.0 3.4* 3.0* 3.3* 4.3  CL 99 97 94* 96 101  CO2 _0 GLUCOSE 219* 134* 202* 214* 142*  BUN 17 24* 25* 23 18  CREATININE 0.86 0.96 1.06 1.00 0.95  CALCIUM 9.0 8.8 8.7 8.7 8.7   Liver Function Tests: No results for input(s): AST, ALT, ALKPHOS, BILITOT, PROT, ALBUMIN in the last 168 hours.  No results for input(s): LIPASE, AMYLASE in the last 168 hours. No results for input(s): AMMONIA in the last 168 hours. CBC:  Recent Labs Lab 10/31/14 1535 10/31/14 2345 11/04/14 0540  WBC 7.2 7.4 7.8  HGB 12.3* 12.7* 12.1*  HCT 39.8 39.6 38.9*  MCV 86.1 85.5 86.1  PLT 430* 394 429*   Cardiac Enzymes:  Recent Labs Lab 10/31/14 1602 10/31/14 2050 10/31/14 2345 11/01/14 0216 11/01/14 0540  TROPONINI 0.06* 0.05* 0.06* 0.05* 0.05*   BNP: BNP (last 3 results) No results for input(s): PROBNP in the last 8760 hours. CBG:  Recent Labs Lab 11/05/14 2148 11/06/14 0742 11/06/14 1549 11/06/14 2225 11/07/14 0626  GLUCAP 166* 154* 130* 149* 135*       Signed:  Rowe Clack N  [facility] 11/07/2014, 11:45 AM

## 2014-11-07 NOTE — Progress Notes (Signed)
Subjective: Breathing OK  No CP   Objective: Filed Vitals:   11/06/14 1830 11/06/14 1844 11/06/14 2227 11/07/14 0628  BP: 134/109 123/63 115/73 126/73  Pulse:   77 73  Temp:   98.4 F (36.9 C) 98.1 F (36.7 C)  TempSrc:   Oral Oral  Resp:   18 18  Height:      Weight:    259 lb 11.2 oz (117.8 kg)  SpO2:   99% 93%   Weight change: -1 lb 14.4 oz (-0.861 kg)  Intake/Output Summary (Last 24 hours) at 11/07/14 0802 Last data filed at 11/06/14 1600  Gross per 24 hour  Intake      0 ml  Output    750 ml  Net   -750 ml    General: Alert, awake, oriented x3, in no acute distress Neck:  JVP is increased   Heart: Regular rate and rhythm, without murmurs, rubs, gallops.  Lungs: Clear to auscultation.  No rales or wheezes. Exemities:  Tr edema.   Neuro: Grossly intact, nonfocal.   Lab Results: Results for orders placed or performed during the hospital encounter of 10/31/14 (from the past 24 hour(s))  I-STAT 3, arterial blood gas (G3+)     Status: Abnormal   Collection Time: 11/06/14  2:54 PM  Result Value Ref Range   pH, Arterial 7.412 7.350 - 7.450   pCO2 arterial 44.7 35.0 - 45.0 mmHg   pO2, Arterial 65.0 (L) 80.0 - 100.0 mmHg   Bicarbonate 28.4 (H) 20.0 - 24.0 mEq/L   TCO2 30 0 - 100 mmol/L   O2 Saturation 93.0 %   Acid-Base Excess 3.0 (H) 0.0 - 2.0 mmol/L   Sample type ARTERIAL   I-STAT 3, venous blood gas (G3P V)     Status: Abnormal   Collection Time: 11/06/14  2:58 PM  Result Value Ref Range   pH, Ven 7.380 (H) 7.250 - 7.300   pCO2, Ven 53.6 (H) 45.0 - 50.0 mmHg   pO2, Ven 31.0 30.0 - 45.0 mmHg   Bicarbonate 31.7 (H) 20.0 - 24.0 mEq/L   TCO2 33 0 - 100 mmol/L   O2 Saturation 56.0 %   Acid-Base Excess 5.0 (H) 0.0 - 2.0 mmol/L   Sample type VENOUS   POCT Activated clotting time     Status: None   Collection Time: 11/06/14  3:22 PM  Result Value Ref Range   Activated Clotting Time 147 seconds  Glucose, capillary     Status: Abnormal   Collection Time:  11/06/14  3:49 PM  Result Value Ref Range   Glucose-Capillary 130 (H) 70 - 99 mg/dL  Glucose, capillary     Status: Abnormal   Collection Time: 11/06/14 10:25 PM  Result Value Ref Range   Glucose-Capillary 149 (H) 70 - 99 mg/dL   Comment 1 Documented in Chart    Comment 2 Notify RN   Basic metabolic panel     Status: Abnormal   Collection Time: 11/07/14  5:00 AM  Result Value Ref Range   Sodium 138 135 - 145 mmol/L   Potassium 4.3 3.5 - 5.1 mmol/L   Chloride 101 96 - 112 mEq/L   CO2 28 19 - 32 mmol/L   Glucose, Bld 142 (H) 70 - 99 mg/dL   BUN 18 6 - 23 mg/dL   Creatinine, Ser 0.95 0.50 - 1.35 mg/dL   Calcium 8.7 8.4 - 10.5 mg/dL   GFR calc non Af Amer 85 (L) >90 mL/min   GFR  calc Af Amer >90 >90 mL/min   Anion gap 9 5 - 15  Glucose, capillary     Status: Abnormal   Collection Time: 11/07/14  6:26 AM  Result Value Ref Range   Glucose-Capillary 135 (H) 70 - 99 mg/dL   Comment 1 Notify RN    Comment 2 Documented in Chart     Studies/Results: No results found.  Medications: Reviewed   _0 @  1  Acute on chronic systolic CHF  Cath yesterday showed elevated filling pressures (LVED)P 26 mm)   , no signif CAD  Plan for diuresis as filling pressures up  I will see what he got this AM  WOuld like to get before D/C   Switch lopressor to Coreg.  Continue olmesartan.   Check BNP today.   WIll need to set up for outpatient f/u  Patient would benefit from cardiac rehab I think for education, monitoring.    2.  HTN WIll switch to Coreg  Keep on other agents.    3.  HL  Continue Crestor   LOS: 7 days   Dorris Carnes 11/07/2014, 8:02 AM

## 2014-11-07 NOTE — Care Management Note (Signed)
    Page 1 of 1   11/07/2014     2:19:21 PM CARE MANAGEMENT NOTE 11/07/2014  Patient:  Nicholas Caldwell, Nicholas Caldwell   Account Number:  [id number]  Date Initiated:  11/07/2014  Documentation initiated by:  Marvetta Gibbons  Subjective/Objective Assessment:   Pt admitted wtih PNA     Action/Plan:   PTA pt lived at home   Anticipated DC Date:  11/07/2014   Anticipated DC Plan:  HOME/SELF CARE         Choice offered to / List presented to:             Status of service:  Completed, signed off Medicare Important Message given?  YES (If response is "NO", the following Medicare IM given date fields will be blank) Date Medicare IM given:  11/07/2014 Medicare IM given by:  Marvetta Gibbons Date Additional Medicare IM given:   Additional Medicare IM given by:    Discharge Disposition:  HOME/SELF CARE  Per UR Regulation:  Reviewed for med. necessity/level of care/duration of stay  If discussed at [place] of Stay Meetings, dates discussed:    Comments:

## 2014-11-27 ENCOUNTER — Encounter: Payer: Self-pay | Admitting: Interventional Cardiology

## 2014-11-27 ENCOUNTER — Ambulatory Visit (INDEPENDENT_AMBULATORY_CARE_PROVIDER_SITE_OTHER): Payer: Medicare Other | Admitting: Interventional Cardiology

## 2014-11-27 VITALS — BP 118/64 | HR 107 | Ht 75.0 in | Wt 263.0 lb

## 2014-11-27 DIAGNOSIS — E785 Hyperlipidemia, unspecified: Secondary | ICD-10-CM

## 2014-11-27 DIAGNOSIS — F17200 Nicotine dependence, unspecified, uncomplicated: Secondary | ICD-10-CM

## 2014-11-27 DIAGNOSIS — I5021 Acute systolic (congestive) heart failure: Secondary | ICD-10-CM

## 2014-11-27 DIAGNOSIS — E119 Type 2 diabetes mellitus without complications: Secondary | ICD-10-CM

## 2014-11-27 DIAGNOSIS — I10 Essential (primary) hypertension: Secondary | ICD-10-CM | POA: Diagnosis not present

## 2014-11-27 DIAGNOSIS — E1169 Type 2 diabetes mellitus with other specified complication: Secondary | ICD-10-CM

## 2014-11-27 DIAGNOSIS — Z720 Tobacco use: Secondary | ICD-10-CM

## 2014-11-27 DIAGNOSIS — E669 Obesity, unspecified: Secondary | ICD-10-CM

## 2014-11-27 MED ORDER — FUROSEMIDE 40 MG PO TABS: 40.0000 mg | ORAL_TABLET | Freq: Every day | ORAL | Status: DC

## 2014-11-27 MED ORDER — LISINOPRIL 10 MG PO TABS: 10.0000 mg | ORAL_TABLET | Freq: Every day | ORAL | Status: DC

## 2014-11-27 MED ORDER — CARVEDILOL 12.5 MG PO TABS: 12.5000 mg | ORAL_TABLET | Freq: Two times a day (BID) | ORAL | Status: DC

## 2014-11-27 NOTE — Patient Instructions (Addendum)
Your physician has recommended you make the following change in your medication:  1) START raking Lisinopril 69m daily  Your physician recommends that you return for lab work in: one week (BMET)  Your physician would like for you to start weighing yourself daily.  Call our office at [phone] you have a weight gain of more than 3 pounds.  Your physician recommends that you schedule a follow-up appointment in: 3 months with Dr. VIrish Lack

## 2014-11-27 NOTE — Progress Notes (Signed)
Patient ID: Nicholas Caldwell, male   DOB: 1946/05/23, 69 y.o.   MRN: [id number]    [place], [place] [place], [place]  [address] Phone: [phone] Fax:  [phone]  Date:  11/27/2014   ID:  Nicholas Caldwell, DOB 1946/01/28, MRN [id number]  PCP:  MASSENBURG,O'LAF, PA-C      History of Present Illness: Nicholas Caldwell is a 69 y.o. male who was diagnosed with a nonischemic cardiomyopathy after presenting with progressive DOE.  Cath showed nonobstructive disease.  He was discharged on Tribenzor and furosemide along with Coreg.  He had not actually been taking the Tribenzor, but would sometimes get samples from his PMD.  He had never filled a prescription and is not taking it now.  He is not allergic to ACE-inhibitors as far as he knows.    Since leaving the hospital, he has felt well.  He denies [facility] or access site issues.  He is following a string low salt diet and a diabetic diet.  He weighs himself everyday and it has been stable in the low 260s, significantly better from when he was initially admitted.     .     Wt Readings from Last 3 Encounters:  11/27/14 263 lb (119.296 kg)  11/07/14 259 lb 11.2 oz (117.8 kg)  10/26/14 246 lb (111.585 kg)     Past Medical History  Diagnosis Date  . Hypertension   . Diabetes mellitus without complication   . Hypercholesteremia   . Acute systolic CHF (congestive heart failure) 11/02/2014    Current Outpatient Prescriptions  Medication Sig Dispense Refill  . albuterol (PROVENTIL HFA;VENTOLIN HFA) 108 (90 BASE) MCG/ACT inhaler Inhale 2 puffs into the lungs every 6 (six) hours as needed for wheezing or shortness of breath. 1 Inhaler 2  . aspirin 81 MG chewable tablet Chew 1 tablet (81 mg total) by mouth daily.    . benzonatate (TESSALON) 100 MG capsule Take 1 capsule (100 mg total) by mouth every 8 (eight) hours. 21 capsule 0  . carvedilol (COREG) 12.5 MG tablet Take 1 tablet (12.5 mg total) by mouth 2 (two) times daily with a meal. 60 tablet 2  .  furosemide (LASIX) 40 MG tablet Take 1 tablet (40 mg total) by mouth daily. 30 tablet 1  . glipiZIDE (GLUCOTROL) 10 MG tablet Take 20 mg by mouth 2 (two) times daily before a meal.    . metFORMIN (GLUCOPHAGE) 1000 MG tablet Take 1,000 mg by mouth 2 (two) times daily with a meal.    . methadone (DOLOPHINE) 10 MG tablet Take 10 mg by mouth every 12 (twelve) hours.    Marland Kitchen oxyCODONE-acetaminophen (PERCOCET) 10-325 MG per tablet Take 1 tablet by mouth every 6 (six) hours as needed for pain (pain).     Marland Kitchen oxyCODONE-acetaminophen (PERCOCET/ROXICET) 5-325 MG per tablet Take 1 tablet by mouth every 6 (six) hours as needed for moderate pain or severe pain (pain).     . potassium chloride SA (K-DUR,KLOR-CON) 20 MEQ tablet Take 2 tablets (40 mEq total) by mouth daily. 30 tablet 0  . rosuvastatin (CRESTOR) 20 MG tablet Take 20 mg by mouth daily.    . theophylline (UNIPHYL) 400 MG 24 hr tablet Take 200 mg by mouth daily.    . Vitamin D, Ergocalciferol, (DRISDOL) 50000 UNITS CAPS capsule Take 50,000 Units by mouth every 7 (seven) days. On Sunday.    . Olmesartan-Amlodipine-HCTZ (TRIBENZOR PO) Take 1 tablet by mouth daily.     No current facility-administered medications  for this visit.    Allergies:   No Known Allergies  Social History:  The patient  reports that he has been smoking Cigarettes.  He has been smoking about 0.05 packs per day. He has never used smokeless tobacco. He reports that he does not drink alcohol or use illicit drugs.   Family History:  The patient's family history includes Heart disease in his father and mother.   ROS:  Please see the history of present illness.  No nausea, vomiting.  No fevers, chills.  No focal weakness.  No dysuria.    All other systems reviewed and negative.   PHYSICAL EXAM: VS:  BP 118/64 mmHg  Pulse 107  Ht _0  (1.905 m)  Wt 263 lb (119.296 kg)  BMI 32.87 kg/m2 General: Well developed, well nourished, in no acute distress HEENT: normal Neck: no JVD, no  carotid bruits Cardiac:  normal S1, S2; RRR;  Lungs:  clear to auscultation bilaterally, no wheezing, rhonchi or rales Abd: soft, nontender, no hepatomegaly Ext: no edema Skin: warm and dry Neuro:   no focal abnormalities noted Psych: normal affect       ASSESSMENT AND PLAN:  1. Acute systolic heart failure:  Continue Coreg, Lasix.  Appears compensated.  Start Lisinopril 10 mg daily.  Check BMet in a week.  He is not taking tribenzor.  He needs to weigh himself daily. He should call us if his weight increases by more than 2 pounds. Low-salt diet encouraged. 2. HTN: If BP increases, could titrate lisinopril prior to adding to amlodipine.  He checks BP at the pharmacy. Systolics are in the 407-680 range.  Avoid Celebrex given that he going to start an ACE-I.  3. Obesity:  Continue to try to lose weight.  He is limited by hip pain.  He is scheduled to have an operation after DM control improves.  His exercise is limited. He will have to be very careful with his diet.  4. DM: Managed by PMD.   5. He would benefit from stopping smoking.  Signed, Mina Marble, MD, [facility] 11/27/2014 3:10 PM

## 2014-12-04 ENCOUNTER — Other Ambulatory Visit: Payer: Medicare Other

## 2014-12-08 DIAGNOSIS — E119 Type 2 diabetes mellitus without complications: Secondary | ICD-10-CM | POA: Diagnosis not present

## 2014-12-08 DIAGNOSIS — I10 Essential (primary) hypertension: Secondary | ICD-10-CM | POA: Diagnosis not present

## 2014-12-08 DIAGNOSIS — M25559 Pain in unspecified hip: Secondary | ICD-10-CM | POA: Diagnosis not present

## 2014-12-08 DIAGNOSIS — Z79899 Other long term (current) drug therapy: Secondary | ICD-10-CM | POA: Diagnosis not present

## 2014-12-08 DIAGNOSIS — E559 Vitamin D deficiency, unspecified: Secondary | ICD-10-CM | POA: Diagnosis not present

## 2014-12-08 DIAGNOSIS — E785 Hyperlipidemia, unspecified: Secondary | ICD-10-CM | POA: Diagnosis not present

## 2014-12-08 DIAGNOSIS — G894 Chronic pain syndrome: Secondary | ICD-10-CM | POA: Diagnosis not present

## 2015-01-02 DIAGNOSIS — G894 Chronic pain syndrome: Secondary | ICD-10-CM | POA: Diagnosis not present

## 2015-01-02 DIAGNOSIS — M545 Low back pain: Secondary | ICD-10-CM | POA: Diagnosis not present

## 2015-01-02 DIAGNOSIS — Z79891 Long term (current) use of opiate analgesic: Secondary | ICD-10-CM | POA: Diagnosis not present

## 2015-01-02 DIAGNOSIS — Z96641 Presence of right artificial hip joint: Secondary | ICD-10-CM | POA: Diagnosis not present

## 2015-01-02 DIAGNOSIS — Z79899 Other long term (current) drug therapy: Secondary | ICD-10-CM | POA: Diagnosis not present

## 2015-01-02 DIAGNOSIS — M542 Cervicalgia: Secondary | ICD-10-CM | POA: Diagnosis not present

## 2015-01-29 DIAGNOSIS — Z79899 Other long term (current) drug therapy: Secondary | ICD-10-CM | POA: Diagnosis not present

## 2015-01-29 DIAGNOSIS — G894 Chronic pain syndrome: Secondary | ICD-10-CM | POA: Diagnosis not present

## 2015-02-06 DIAGNOSIS — M25559 Pain in unspecified hip: Secondary | ICD-10-CM | POA: Diagnosis not present

## 2015-02-06 DIAGNOSIS — I10 Essential (primary) hypertension: Secondary | ICD-10-CM | POA: Diagnosis not present

## 2015-02-06 DIAGNOSIS — E559 Vitamin D deficiency, unspecified: Secondary | ICD-10-CM | POA: Diagnosis not present

## 2015-02-06 DIAGNOSIS — E119 Type 2 diabetes mellitus without complications: Secondary | ICD-10-CM | POA: Diagnosis not present

## 2015-02-06 DIAGNOSIS — G894 Chronic pain syndrome: Secondary | ICD-10-CM | POA: Diagnosis not present

## 2015-02-12 DIAGNOSIS — R0600 Dyspnea, unspecified: Secondary | ICD-10-CM | POA: Diagnosis not present

## 2015-02-12 DIAGNOSIS — E119 Type 2 diabetes mellitus without complications: Secondary | ICD-10-CM | POA: Diagnosis not present

## 2015-02-12 DIAGNOSIS — I10 Essential (primary) hypertension: Secondary | ICD-10-CM | POA: Diagnosis not present

## 2015-02-12 DIAGNOSIS — G894 Chronic pain syndrome: Secondary | ICD-10-CM | POA: Diagnosis not present

## 2015-02-12 DIAGNOSIS — J189 Pneumonia, unspecified organism: Secondary | ICD-10-CM | POA: Diagnosis not present

## 2015-02-13 ENCOUNTER — Encounter: Payer: Self-pay | Admitting: Interventional Cardiology

## 2015-02-13 ENCOUNTER — Ambulatory Visit (INDEPENDENT_AMBULATORY_CARE_PROVIDER_SITE_OTHER): Payer: Medicare Other | Admitting: Interventional Cardiology

## 2015-02-13 VITALS — BP 160/94 | HR 101 | Ht 72.0 in | Wt 267.4 lb

## 2015-02-13 DIAGNOSIS — Z720 Tobacco use: Secondary | ICD-10-CM

## 2015-02-13 DIAGNOSIS — I5021 Acute systolic (congestive) heart failure: Secondary | ICD-10-CM

## 2015-02-13 DIAGNOSIS — I10 Essential (primary) hypertension: Secondary | ICD-10-CM | POA: Diagnosis not present

## 2015-02-13 DIAGNOSIS — F17200 Nicotine dependence, unspecified, uncomplicated: Secondary | ICD-10-CM

## 2015-02-13 DIAGNOSIS — I5023 Acute on chronic systolic (congestive) heart failure: Secondary | ICD-10-CM | POA: Diagnosis not present

## 2015-02-13 MED ORDER — FUROSEMIDE 40 MG PO TABS: 40.0000 mg | ORAL_TABLET | Freq: Every day | ORAL | Status: DC

## 2015-02-13 MED ORDER — CARVEDILOL 12.5 MG PO TABS: 12.5000 mg | ORAL_TABLET | Freq: Two times a day (BID) | ORAL | Status: DC

## 2015-02-13 MED ORDER — LISINOPRIL 20 MG PO TABS: 20.0000 mg | ORAL_TABLET | Freq: Every day | ORAL | Status: DC

## 2015-02-13 NOTE — Progress Notes (Signed)
Patient ID: Nicholas Caldwell, male   DOB: 1946-09-11, 69 y.o.   MRN: [id number]    [place], [address] [place], [place]  [address] Phone: [phone] Fax:  [phone]  Date:  02/13/2015   ID:  Nicholas Caldwell, DOB [birth year], MRN [id number]  PCP:  MASSENBURG,O'LAF, PA-C      History of Present Illness: Nicholas Caldwell is a 69 y.o. male who was diagnosed with a nonischemic cardiomyopathy after presenting with progressive DOE.  Cath showed nonobstructive disease in Jan 2016.  He was discharged on Tribenzor and furosemide along with Coreg.  He had not actually been taking the Tribenzor, but would sometimes get samples from his PMD.  He had never filled a prescription and is not taking it now.  He is not allergic to ACE-inhibitors as far as he knows.    Since leaving the hospital, he has felt well.  He denies [facility] or access site issues.  He is following a string low salt diet and a diabetic diet.  He weighs himself everyday and it has been stable in the low 260s, significantly better from when he was initially admitted.     .     Wt Readings from Last 3 Encounters:  02/13/15 267 lb 6.4 oz (121.292 kg)  11/27/14 263 lb (119.296 kg)  11/07/14 259 lb 11.2 oz (117.8 kg)     Past Medical History  Diagnosis Date  . Hypertension   . Diabetes mellitus without complication   . Hypercholesteremia   . Acute systolic CHF (congestive heart failure) 11/02/2014    Current Outpatient Prescriptions  Medication Sig Dispense Refill  . albuterol (PROVENTIL HFA;VENTOLIN HFA) 108 (90 BASE) MCG/ACT inhaler Inhale 2 puffs into the lungs every 6 (six) hours as needed for wheezing or shortness of breath. 1 Inhaler 2  . aspirin 81 MG chewable tablet Chew 1 tablet (81 mg total) by mouth daily.    . carvedilol (COREG) 12.5 MG tablet Take 1 tablet (12.5 mg total) by mouth 2 (two) times daily with a meal. 180 tablet 3  . furosemide (LASIX) 40 MG tablet Take 1 tablet (40 mg total) by mouth daily. 90 tablet 3  .  glipiZIDE (GLUCOTROL) 10 MG tablet Take 20 mg by mouth 2 (two) times daily before a meal.    . lisinopril (PRINIVIL,ZESTRIL) 10 MG tablet Take 1 tablet (10 mg total) by mouth daily. 30 tablet 6  . metFORMIN (GLUCOPHAGE) 1000 MG tablet Take 1,000 mg by mouth 2 (two) times daily with a meal.    . methadone (DOLOPHINE) 10 MG tablet Take 10 mg by mouth every 12 (twelve) hours.    . Olmesartan-Amlodipine-HCTZ (TRIBENZOR PO) Take 1 tablet by mouth daily.    Marland Kitchen oxyCODONE-acetaminophen (PERCOCET) 10-325 MG per tablet Take 1 tablet by mouth every 6 (six) hours as needed for pain (pain).     . potassium chloride SA (K-DUR,KLOR-CON) 20 MEQ tablet Take 2 tablets (40 mEq total) by mouth daily. 30 tablet 0  . rosuvastatin (CRESTOR) 20 MG tablet Take 20 mg by mouth daily.    . theophylline (UNIPHYL) 400 MG 24 hr tablet Take 200 mg by mouth daily.    . Vitamin D, Ergocalciferol, (DRISDOL) 50000 UNITS CAPS capsule Take 50,000 Units by mouth every 7 (seven) days. On Sunday.     No current facility-administered medications for this visit.    Allergies:   No Known Allergies  Social History:  The patient  reports that he has been smoking Cigarettes.  He has been smoking about 0.05 packs per day. He has never used smokeless tobacco. He reports that he does not drink alcohol or use illicit drugs.   Family History:  The patient's family history includes Heart disease in his father and mother.   ROS:  Please see the history of present illness.  No nausea, vomiting.  No fevers, chills.  No focal weakness.  No dysuria.    All other systems reviewed and negative.   PHYSICAL EXAM: VS:  BP 160/94 mmHg  Pulse 101  Ht 6' (1.829 m)  Wt 267 lb 6.4 oz (121.292 kg)  BMI 36.26 kg/m2 General: Well developed, well nourished, in no acute distress HEENT: normal Neck: no JVD, no carotid bruits Cardiac:  normal S1, S2; RRR;  Lungs:  clear to auscultation bilaterally, no wheezing, rhonchi or rales Abd: soft, nontender, no  hepatomegaly Ext: no edema Skin: warm and dry Neuro:   no focal abnormalities noted Psych: normal affect       ASSESSMENT AND PLAN:  1. Acute systolic heart failure:  Continue Coreg, Lasix.  Appears decompensated.  His weight increased by 3 lbs as well.  He was also told he had residual pneumonia yesterday by [facility].  Started Lisinopril 10 mg daily.  No longer taking Tribenzor.  Increase Lasix to 40 mg BID for 3 days.   Check BMet in a week.  He needs to weigh himself daily. He should call us if his weight increases by more than 2 pounds. Low-salt diet encouraged. 2. HTN: BP increased today, increase lisinopril.  He checks BP at the pharmacy. Systolics are in the 353-299 range, but higher when he uses his albuterol.  Avoid Celebrex given that he going to start an ACE-I.  3. Obesity:  Continue to try to lose weight.  He is limited by hip pain.  He is scheduled to have an operation after DM control improves- currently not scheduled.  His exercise is limited. He will have to be very careful with his diet.  4. DM: Managed by PMD.   He would benefit from stopping smoking.   The patient was counseled on the dangers of tobacco use, both inhaled and oral, which include, but are not limited to cardiovascular disease, increased cancer risk of multiple types of cancer, COPD, peripheral vascular disease, strokes. He was also counseled on the benefits of smoking cessation. The patient was firmly advised to quit.    We also reviewed strategies to maximize success, including: Removing cigarettes and smoking materials from environment Stress management Substitution of other forms of reinforcement Support of family/friends. Selecting a quit date. Patient provided contact information for 1-800-QUIT-NOW    Signed, Mina Marble, MD, [facility] 02/13/2015 4:32 PM

## 2015-02-13 NOTE — Patient Instructions (Addendum)
Medication Instructions:  1. INCREASING LASIX TO 40 MG TWICE DAILY FOR 3 DAYS; THEN GO BACK TO YOUR REGULAR DOSE OF LASIX 40 MG DAILY 2. INCREASE LISINOPRIL TO 20 MG DAILY; NEW RX SENT IN  Labwork: BMET, BNP IN 1 WEEK  Testing/Procedures: NONE  Follow-Up: 1 WEEK WITH ONE OF OUR PA'S OR NP  Any Other Special Instructions Will Be Listed Below (If Applicable).

## 2015-02-16 DIAGNOSIS — J189 Pneumonia, unspecified organism: Secondary | ICD-10-CM | POA: Diagnosis not present

## 2015-02-16 DIAGNOSIS — J45909 Unspecified asthma, uncomplicated: Secondary | ICD-10-CM | POA: Diagnosis not present

## 2015-02-19 ENCOUNTER — Encounter (HOSPITAL_COMMUNITY): Payer: Self-pay | Admitting: Emergency Medicine

## 2015-02-19 ENCOUNTER — Emergency Department (HOSPITAL_COMMUNITY): Payer: Medicare Other

## 2015-02-19 ENCOUNTER — Inpatient Hospital Stay (HOSPITAL_COMMUNITY)
Admission: EM | Admit: 2015-02-19 | Discharge: 2015-02-23 | DRG: 291 | Disposition: A | Discharge status: Home / Self Care | Payer: Medicare Other | Attending: Internal Medicine | Admitting: Internal Medicine

## 2015-02-19 DIAGNOSIS — I480 Paroxysmal atrial fibrillation: Secondary | ICD-10-CM | POA: Diagnosis present

## 2015-02-19 DIAGNOSIS — I483 Typical atrial flutter: Secondary | ICD-10-CM | POA: Diagnosis present

## 2015-02-19 DIAGNOSIS — R9431 Abnormal electrocardiogram [ECG] [EKG]: Secondary | ICD-10-CM

## 2015-02-19 DIAGNOSIS — E785 Hyperlipidemia, unspecified: Secondary | ICD-10-CM | POA: Diagnosis present

## 2015-02-19 DIAGNOSIS — Z7901 Long term (current) use of anticoagulants: Secondary | ICD-10-CM | POA: Diagnosis not present

## 2015-02-19 DIAGNOSIS — J9601 Acute respiratory failure with hypoxia: Secondary | ICD-10-CM | POA: Diagnosis present

## 2015-02-19 DIAGNOSIS — G8929 Other chronic pain: Secondary | ICD-10-CM

## 2015-02-19 DIAGNOSIS — I214 Non-ST elevation (NSTEMI) myocardial infarction: Secondary | ICD-10-CM

## 2015-02-19 DIAGNOSIS — M5136 Other intervertebral disc degeneration, lumbar region: Secondary | ICD-10-CM | POA: Diagnosis not present

## 2015-02-19 DIAGNOSIS — I428 Other cardiomyopathies: Secondary | ICD-10-CM | POA: Diagnosis present

## 2015-02-19 DIAGNOSIS — I5023 Acute on chronic systolic (congestive) heart failure: Secondary | ICD-10-CM | POA: Diagnosis not present

## 2015-02-19 DIAGNOSIS — I5021 Acute systolic (congestive) heart failure: Secondary | ICD-10-CM | POA: Diagnosis not present

## 2015-02-19 DIAGNOSIS — J9602 Acute respiratory failure with hypercapnia: Secondary | ICD-10-CM

## 2015-02-19 DIAGNOSIS — R778 Other specified abnormalities of plasma proteins: Secondary | ICD-10-CM | POA: Diagnosis present

## 2015-02-19 DIAGNOSIS — F17210 Nicotine dependence, cigarettes, uncomplicated: Secondary | ICD-10-CM | POA: Diagnosis not present

## 2015-02-19 DIAGNOSIS — I10 Essential (primary) hypertension: Secondary | ICD-10-CM | POA: Diagnosis not present

## 2015-02-19 DIAGNOSIS — Z8701 Personal history of pneumonia (recurrent): Secondary | ICD-10-CM

## 2015-02-19 DIAGNOSIS — I509 Heart failure, unspecified: Secondary | ICD-10-CM | POA: Diagnosis not present

## 2015-02-19 DIAGNOSIS — E780 Pure hypercholesterolemia: Secondary | ICD-10-CM | POA: Diagnosis present

## 2015-02-19 DIAGNOSIS — E1140 Type 2 diabetes mellitus with diabetic neuropathy, unspecified: Secondary | ICD-10-CM

## 2015-02-19 DIAGNOSIS — E1165 Type 2 diabetes mellitus with hyperglycemia: Secondary | ICD-10-CM | POA: Diagnosis not present

## 2015-02-19 DIAGNOSIS — R0602 Shortness of breath: Secondary | ICD-10-CM | POA: Diagnosis not present

## 2015-02-19 DIAGNOSIS — R7989 Other specified abnormal findings of blood chemistry: Secondary | ICD-10-CM | POA: Diagnosis not present

## 2015-02-19 DIAGNOSIS — I429 Cardiomyopathy, unspecified: Secondary | ICD-10-CM

## 2015-02-19 DIAGNOSIS — Z794 Long term (current) use of insulin: Secondary | ICD-10-CM

## 2015-02-19 DIAGNOSIS — I4891 Unspecified atrial fibrillation: Secondary | ICD-10-CM

## 2015-02-19 DIAGNOSIS — I110 Hypertensive heart disease with heart failure: Secondary | ICD-10-CM | POA: Diagnosis present

## 2015-02-19 DIAGNOSIS — J441 Chronic obstructive pulmonary disease with (acute) exacerbation: Secondary | ICD-10-CM | POA: Diagnosis not present

## 2015-02-19 DIAGNOSIS — R509 Fever, unspecified: Secondary | ICD-10-CM | POA: Diagnosis not present

## 2015-02-19 DIAGNOSIS — I2510 Atherosclerotic heart disease of native coronary artery without angina pectoris: Secondary | ICD-10-CM | POA: Diagnosis present

## 2015-02-19 DIAGNOSIS — G9340 Encephalopathy, unspecified: Secondary | ICD-10-CM

## 2015-02-19 DIAGNOSIS — R000 Tachycardia, unspecified: Secondary | ICD-10-CM | POA: Diagnosis not present

## 2015-02-19 DIAGNOSIS — I5020 Unspecified systolic (congestive) heart failure: Secondary | ICD-10-CM | POA: Diagnosis not present

## 2015-02-19 DIAGNOSIS — E872 Acidosis, unspecified: Secondary | ICD-10-CM

## 2015-02-19 DIAGNOSIS — Z6836 Body mass index (BMI) 36.0-36.9, adult: Secondary | ICD-10-CM

## 2015-02-19 DIAGNOSIS — I4581 Long QT syndrome: Secondary | ICD-10-CM | POA: Diagnosis not present

## 2015-02-19 DIAGNOSIS — I4892 Unspecified atrial flutter: Secondary | ICD-10-CM | POA: Diagnosis not present

## 2015-02-19 DIAGNOSIS — R0600 Dyspnea, unspecified: Secondary | ICD-10-CM

## 2015-02-19 DIAGNOSIS — M25559 Pain in unspecified hip: Secondary | ICD-10-CM | POA: Diagnosis present

## 2015-02-19 DIAGNOSIS — Z7982 Long term (current) use of aspirin: Secondary | ICD-10-CM | POA: Diagnosis not present

## 2015-02-19 DIAGNOSIS — R069 Unspecified abnormalities of breathing: Secondary | ICD-10-CM | POA: Diagnosis not present

## 2015-02-19 DIAGNOSIS — R740 Nonspecific elevation of levels of transaminase and lactic acid dehydrogenase [LDH]: Secondary | ICD-10-CM | POA: Diagnosis not present

## 2015-02-19 LAB — GLUCOSE, CAPILLARY
GLUCOSE-CAPILLARY: 312 mg/dL — AB (ref 70–99)
Glucose-Capillary: 274 mg/dL — ABNORMAL HIGH (ref 70–99)

## 2015-02-19 LAB — CBC WITH DIFFERENTIAL/PLATELET
BASOS ABS: 0 10*3/uL (ref 0.0–0.1)
BASOS PCT: 0 % (ref 0–1)
EOS ABS: 0.1 10*3/uL (ref 0.0–0.7)
Eosinophils Relative: 1 % (ref 0–5)
HCT: 41.4 % (ref 39.0–52.0)
HEMOGLOBIN: 12.7 g/dL — AB (ref 13.0–17.0)
Lymphocytes Relative: 43 % (ref 12–46)
Lymphs Abs: 3.3 10*3/uL (ref 0.7–4.0)
MCH: 26.8 pg (ref 26.0–34.0)
MCHC: 30.7 g/dL (ref 30.0–36.0)
MCV: 87.3 fL (ref 78.0–100.0)
Monocytes Absolute: 0.5 10*3/uL (ref 0.1–1.0)
Monocytes Relative: 6 % (ref 3–12)
NEUTROS PCT: 50 % (ref 43–77)
Neutro Abs: 3.7 10*3/uL (ref 1.7–7.7)
Platelets: 397 10*3/uL (ref 150–400)
RBC: 4.74 MIL/uL (ref 4.22–5.81)
RDW: 15.8 % — ABNORMAL HIGH (ref 11.5–15.5)
WBC: 7.6 10*3/uL (ref 4.0–10.5)

## 2015-02-19 LAB — CBG MONITORING, ED
GLUCOSE-CAPILLARY: 270 mg/dL — AB (ref 70–99)
Glucose-Capillary: 285 mg/dL — ABNORMAL HIGH (ref 70–99)

## 2015-02-19 LAB — BASIC METABOLIC PANEL
Anion gap: 11 (ref 5–15)
BUN: 16 mg/dL (ref 6–23)
CHLORIDE: 100 mmol/L (ref 96–112)
CO2: 26 mmol/L (ref 19–32)
CREATININE: 0.99 mg/dL (ref 0.50–1.35)
Calcium: 8.4 mg/dL (ref 8.4–10.5)
GFR calc non Af Amer: 83 mL/min — ABNORMAL LOW (ref >90)
Glucose, Bld: 354 mg/dL — ABNORMAL HIGH (ref 70–99)
POTASSIUM: 4.3 mmol/L (ref 3.5–5.1)
Sodium: 137 mmol/L (ref 135–145)

## 2015-02-19 LAB — RAPID URINE DRUG SCREEN, HOSP PERFORMED
AMPHETAMINES: NOT DETECTED
BARBITURATES: NOT DETECTED
BENZODIAZEPINES: POSITIVE — AB
COCAINE: NOT DETECTED
Opiates: NOT DETECTED
TETRAHYDROCANNABINOL: NOT DETECTED

## 2015-02-19 LAB — TROPONIN I
TROPONIN I: 0.04 ng/mL — AB (ref <0.031)
TROPONIN I: 0.2 ng/mL — AB (ref <0.031)
Troponin I: 0.26 ng/mL — ABNORMAL HIGH (ref <0.031)

## 2015-02-19 LAB — I-STAT ARTERIAL BLOOD GAS, ED
ACID-BASE DEFICIT: 2 mmol/L (ref 0.0–2.0)
BICARBONATE: 25.8 meq/L — AB (ref 20.0–24.0)
O2 Saturation: 85 %
Patient temperature: 98.6
TCO2: 27 mmol/L (ref 0–100)
pCO2 arterial: 56.3 mmHg — ABNORMAL HIGH (ref 35.0–45.0)
pH, Arterial: 7.268 — ABNORMAL LOW (ref 7.350–7.450)
pO2, Arterial: 58 mmHg — ABNORMAL LOW (ref 80.0–100.0)

## 2015-02-19 LAB — MRSA PCR SCREENING: MRSA by PCR: NEGATIVE

## 2015-02-19 LAB — BRAIN NATRIURETIC PEPTIDE: B Natriuretic Peptide: 811.2 pg/mL — ABNORMAL HIGH (ref 0.0–100.0)

## 2015-02-19 MED ORDER — ASPIRIN 300 MG RE SUPP
300.0000 mg | Freq: Once | RECTAL | Status: AC
  Administered 2015-02-19: 300 mg via RECTAL
  Filled 2015-02-19: qty 1

## 2015-02-19 MED ORDER — IPRATROPIUM-ALBUTEROL 0.5-2.5 (3) MG/3ML IN SOLN
3.0000 mL | Freq: Three times a day (TID) | RESPIRATORY_TRACT | Status: DC
  Administered 2015-02-20 – 2015-02-21 (×3): 3 mL via RESPIRATORY_TRACT
  Filled 2015-02-19 (×4): qty 3

## 2015-02-19 MED ORDER — CARVEDILOL 12.5 MG PO TABS: 12.5000 mg | ORAL_TABLET | Freq: Two times a day (BID) | ORAL | Status: DC

## 2015-02-19 MED ORDER — INSULIN ASPART 100 UNIT/ML ~~LOC~~ SOLN
0.0000 [IU] | Freq: Every day | SUBCUTANEOUS | Status: DC
  Administered 2015-02-19: 3 [IU] via SUBCUTANEOUS
  Administered 2015-02-20 – 2015-02-22 (×2): 2 [IU] via SUBCUTANEOUS

## 2015-02-19 MED ORDER — IPRATROPIUM BROMIDE 0.02 % IN SOLN: 0.5000 mg | Freq: Once | RESPIRATORY_TRACT | Status: AC

## 2015-02-19 MED ORDER — FUROSEMIDE 10 MG/ML IJ SOLN
40.0000 mg | Freq: Once | INTRAMUSCULAR | Status: AC
  Administered 2015-02-19: 40 mg via INTRAVENOUS
  Filled 2015-02-19: qty 4

## 2015-02-19 MED ORDER — LISINOPRIL 20 MG PO TABS
20.0000 mg | ORAL_TABLET | Freq: Every day | ORAL | Status: DC
  Administered 2015-02-19 – 2015-02-23 (×5): 20 mg via ORAL
  Filled 2015-02-19 (×5): qty 1

## 2015-02-19 MED ORDER — DICLOFENAC SODIUM 1 % TD GEL
2.0000 g | Freq: Four times a day (QID) | TRANSDERMAL | Status: DC
  Administered 2015-02-19 – 2015-02-22 (×12): 2 g via TOPICAL
  Filled 2015-02-19 (×2): qty 100

## 2015-02-19 MED ORDER — IPRATROPIUM-ALBUTEROL 0.5-2.5 (3) MG/3ML IN SOLN
RESPIRATORY_TRACT | Status: AC
  Administered 2015-02-19: 3 mL
  Filled 2015-02-19: qty 3

## 2015-02-19 MED ORDER — IPRATROPIUM-ALBUTEROL 0.5-2.5 (3) MG/3ML IN SOLN
3.0000 mL | RESPIRATORY_TRACT | Status: DC
  Administered 2015-02-19: 3 mL via RESPIRATORY_TRACT
  Filled 2015-02-19: qty 3

## 2015-02-19 MED ORDER — ACETAMINOPHEN 325 MG PO TABS: 650.0000 mg | ORAL_TABLET | Freq: Four times a day (QID) | ORAL | Status: DC | PRN

## 2015-02-19 MED ORDER — SODIUM CHLORIDE 0.9 % IJ SOLN
3.0000 mL | Freq: Two times a day (BID) | INTRAMUSCULAR | Status: DC
  Administered 2015-02-19 – 2015-02-23 (×8): 3 mL via INTRAVENOUS

## 2015-02-19 MED ORDER — OXYCODONE-ACETAMINOPHEN 5-325 MG PO TABS: 1.0000 | ORAL_TABLET | Freq: Once | ORAL | Status: DC

## 2015-02-19 MED ORDER — RIVAROXABAN 20 MG PO TABS
20.0000 mg | ORAL_TABLET | Freq: Every day | ORAL | Status: DC
  Administered 2015-02-19 – 2015-02-22 (×4): 20 mg via ORAL
  Filled 2015-02-19 (×5): qty 1

## 2015-02-19 MED ORDER — ALBUTEROL SULFATE (2.5 MG/3ML) 0.083% IN NEBU: 5.0000 mg | INHALATION_SOLUTION | RESPIRATORY_TRACT | Status: AC | PRN

## 2015-02-19 MED ORDER — INSULIN ASPART 100 UNIT/ML ~~LOC~~ SOLN
0.0000 [IU] | Freq: Three times a day (TID) | SUBCUTANEOUS | Status: DC
  Administered 2015-02-19: 5 [IU] via SUBCUTANEOUS
  Administered 2015-02-20 – 2015-02-21 (×4): 2 [IU] via SUBCUTANEOUS
  Filled 2015-02-19: qty 1

## 2015-02-19 MED ORDER — ROSUVASTATIN CALCIUM 20 MG PO TABS
20.0000 mg | ORAL_TABLET | Freq: Every day | ORAL | Status: DC
  Administered 2015-02-20 – 2015-02-23 (×4): 20 mg via ORAL
  Filled 2015-02-19 (×4): qty 1

## 2015-02-19 MED ORDER — ALBUTEROL SULFATE (2.5 MG/3ML) 0.083% IN NEBU
5.0000 mg | INHALATION_SOLUTION | RESPIRATORY_TRACT | Status: AC
Start: 2015-02-19 — End: 2015-02-19
  Administered 2015-02-19 (×2): 5 mg via RESPIRATORY_TRACT
  Filled 2015-02-19 (×2): qty 6

## 2015-02-19 MED ORDER — FUROSEMIDE 10 MG/ML IJ SOLN
40.0000 mg | Freq: Two times a day (BID) | INTRAMUSCULAR | Status: DC
  Administered 2015-02-19 – 2015-02-21 (×4): 40 mg via INTRAVENOUS
  Filled 2015-02-19 (×7): qty 4

## 2015-02-19 MED ORDER — IPRATROPIUM-ALBUTEROL 0.5-2.5 (3) MG/3ML IN SOLN: 3.0000 mL | RESPIRATORY_TRACT | Status: DC

## 2015-02-19 MED ORDER — DILTIAZEM HCL 100 MG IV SOLR: 5.0000 mg/h | INTRAVENOUS | Status: DC

## 2015-02-19 MED ORDER — CARVEDILOL 12.5 MG PO TABS
12.5000 mg | ORAL_TABLET | Freq: Two times a day (BID) | ORAL | Status: DC
  Administered 2015-02-19 – 2015-02-21 (×4): 12.5 mg via ORAL
  Filled 2015-02-19 (×7): qty 1

## 2015-02-19 MED ORDER — METOPROLOL TARTRATE 1 MG/ML IV SOLN
2.5000 mg | Freq: Once | INTRAVENOUS | Status: AC
  Administered 2015-02-19: 2.5 mg via INTRAVENOUS
  Filled 2015-02-19: qty 5

## 2015-02-19 MED ORDER — IPRATROPIUM-ALBUTEROL 0.5-2.5 (3) MG/3ML IN SOLN: 3.0000 mL | Freq: Once | RESPIRATORY_TRACT | Status: AC

## 2015-02-19 MED ORDER — ENOXAPARIN SODIUM 40 MG/0.4ML ~~LOC~~ SOLN
40.0000 mg | SUBCUTANEOUS | Status: DC
  Administered 2015-02-19: 40 mg via SUBCUTANEOUS
  Filled 2015-02-19 (×2): qty 0.4

## 2015-02-19 MED ORDER — ASPIRIN 81 MG PO CHEW
81.0000 mg | CHEWABLE_TABLET | Freq: Every day | ORAL | Status: DC
  Filled 2015-02-19: qty 1

## 2015-02-19 NOTE — Consult Note (Signed)
CARDIOLOGY CONSULT NOTE  Patient ID: Gwen Edler, MRN: [id number], DOB/AGE: 1946/06/07 69 y.o. Admit date: 02/19/2015 Date of Consult: 02/19/2015  Primary Physician: [name] Primary Cardiologist: [name] Referring Physician: Dr Raelene Bott  Chief Complaint: Shortness of breath Reason for Consultation: atrial fibrillation  HPI: 69 year-old male with acute on chronic shortness of breath. He has longstanding HTN and diabetes. He was diagnosed with severe LV dysfunction when he was hospitalized in December 2015 with CHF. Cardiac cath showed no obstructive CAD. He's been manaed medically and has required high doses of oral diuretics to manage his heart failure and volume overload. The patient appears to be poorly complaint with ongoing tobacco use.   He reports worsening shortness of breath over the past week, despite increasing outpatient diuretic dosing. He denies chest pain. Also complains of orthopnea and leg swelling. He denies fever, chills, or cough. He was noted to be in AF with RVR earlier today. Cardiology is asked to evaluate him for atrial fibrillation, heart failure, and elevated troponin levels.   At the time of my evaluation, he continues to complain of shortness of breath. Has no other specific complaints at this time except for hip pain which has been a chronic problem treated with oral narcotics.   Medical History:  Past Medical History  Diagnosis Date  . Hypertension   . Diabetes mellitus without complication   . Hypercholesteremia   . Acute systolic CHF (congestive heart failure) 11/02/2014      Surgical History:  Past Surgical History  Procedure Laterality Date  . Left and right heart catheterization with coronary angiogram N/A 11/06/2014    Procedure: LEFT AND RIGHT HEART CATHETERIZATION WITH CORONARY ANGIOGRAM;  Surgeon: Jettie Booze, MD;  Location: [facility] CATH LAB;  Service: Cardiovascular;  Laterality: N/A;     Home Meds: Prior to Admission  medications   Medication Sig Start Date End Date Taking? Authorizing Provider  albuterol (PROVENTIL HFA;VENTOLIN HFA) 108 (90 BASE) MCG/ACT inhaler Inhale 2 puffs into the lungs every 6 (six) hours as needed for wheezing or shortness of breath. 11/07/14  Yes Kinnie Feil, MD  aspirin 81 MG chewable tablet Chew 1 tablet (81 mg total) by mouth daily. 11/07/14  Yes Kinnie Feil, MD  carvedilol (COREG) 12.5 MG tablet Take 1 tablet (12.5 mg total) by mouth 2 (two) times daily with a meal. 02/13/15  Yes Jettie Booze, MD  furosemide (LASIX) 40 MG tablet Take 1 tablet (40 mg total) by mouth daily. 02/13/15  Yes Jettie Booze, MD  glipiZIDE (GLUCOTROL) 10 MG tablet Take 20 mg by mouth 2 (two) times daily before a meal.   Yes Historical Provider, MD  levofloxacin (LEVAQUIN) 500 MG tablet Take 500 mg by mouth daily. 02/15/15 02/25/15 Yes Historical Provider, MD  lisinopril (PRINIVIL,ZESTRIL) 20 MG tablet Take 1 tablet (20 mg total) by mouth daily. 02/13/15  Yes Jettie Booze, MD  metFORMIN (GLUCOPHAGE) 1000 MG tablet Take 1,000 mg by mouth 2 (two) times daily with a meal.   Yes Historical Provider, MD  methadone (DOLOPHINE) 10 MG tablet Take 10 mg by mouth every 12 (twelve) hours.   Yes Historical Provider, MD  oxyCODONE-acetaminophen (PERCOCET) 10-325 MG per tablet Take 1 tablet by mouth every 6 (six) hours as needed for pain (pain).    Yes Historical Provider, MD  potassium chloride SA (K-DUR,KLOR-CON) 20 MEQ tablet Take 2 tablets (40 mEq total) by mouth daily. 11/07/14  Yes Kinnie Feil, MD  rosuvastatin (CRESTOR) 20  MG tablet Take 20 mg by mouth daily.   Yes Historical Provider, MD  theophylline (UNIPHYL) 400 MG 24 hr tablet Take 200 mg by mouth daily.   Yes Historical Provider, MD  Vitamin D, Ergocalciferol, (DRISDOL) 50000 UNITS CAPS capsule Take 50,000 Units by mouth every 7 (seven) days. On Sunday.   Yes Historical Provider, MD    Inpatient Medications:  . aspirin  81 mg Oral  Daily  . carvedilol  12.5 mg Oral BID WC  . diclofenac sodium  2 g Topical QID  . enoxaparin (LOVENOX) injection  40 mg Subcutaneous Q24H  . insulin aspart  0-5 Units Subcutaneous QHS  . insulin aspart  0-9 Units Subcutaneous TID WC  . ipratropium-albuterol  3 mL Nebulization Q4H  . lisinopril  20 mg Oral Daily  . [START ON 02/20/2015] rosuvastatin  20 mg Oral Daily  . sodium chloride  3 mL Intravenous Q12H      Allergies: No Known Allergies  History   Social History  . Marital Status: Single    Spouse Name: N/A  . Number of Children: N/A  . Years of Education: N/A   Occupational History  . Not on file.   Social History Main Topics  . Smoking status: Current Some Day Smoker -- 0.05 packs/day    Types: Cigarettes  . Smokeless tobacco: Never Used  . Alcohol Use: No  . Drug Use: No  . Sexual Activity: Not on file   Other Topics Concern  . Not on file   Social History Narrative     Family History  Problem Relation Age of Onset  . Heart disease Mother   . Heart disease Father     Review of Systems: General: negative for chills, fever, night sweats or weight changes.  ENT: negative for rhinorrhea or epistaxis Cardiovascular: see HPI Dermatological: negative for rash Respiratory: negative for cough or wheezing, positive dyspnea GI: negative for nausea, vomiting, diarrhea, bright red blood per rectum, melena, or hematemesis GU: no hematuria, urgency, or frequency Neurologic: negative for visual changes, syncope, headache, or dizziness.  Heme: no easy bruising or bleeding Endo: negative for excessive thirst, thyroid disorder, or flushing Musculoskeletal: positive for hip pain  All other systems reviewed and are otherwise negative except as noted above.  Physical Exam: Blood pressure 162/89, pulse 71, temperature 98.7 F (37.1 C), temperature source Oral, resp. rate 14, height 6' (1.829 m), weight 264 lb 5.3 oz (119.9 kg), SpO2 94 %. Pt is alert and oriented, WD,  WN, obese male in no distress. HEENT: normal Neck: JVP normal. Carotid upstrokes normal without bruits. No thyromegaly. Lungs: equal expansion, clear bilaterally CV: Apex is nonpalpable, irregularly irregular without murmur or gallop Abd: soft, NT, +BS, obese Back: no CVA tenderness Ext: no C/C/E Skin: warm and dry without rash Neuro: CNII-XII intact             Strength intact = bilaterally    Labs:  Recent Labs  02/19/15 0600 02/19/15 1327 02/19/15 1830  TROPONINI 0.04* 0.20* 0.26*   Lab Results  Component Value Date   WBC 7.6 02/19/2015   HGB 12.7* 02/19/2015   HCT 41.4 02/19/2015   MCV 87.3 02/19/2015   PLT 397 02/19/2015    Recent Labs Lab 02/19/15 0600  NA 137  K 4.3  CL 100  CO2 26  BUN 16  CREATININE 0.99  CALCIUM 8.4  GLUCOSE 354*   Lab Results  Component Value Date   CHOL 147 11/01/2014   HDL 48  11/01/2014   LDLCALC 72 11/01/2014   TRIG 134 11/01/2014   Lab Results  Component Value Date   DDIMER 0.42 10/31/2014    Radiology/Studies:  Dg Chest Port 1 View  02/19/2015   CLINICAL DATA:  Shortness of breath.  COPD.  Asthma.  EXAM: PORTABLE CHEST - 1 VIEW  COMPARISON:  11/03/2014 and 10/31/2014  FINDINGS: Chronic cardiomegaly. Pulmonary vascularity is normal. Haziness at the lung bases is felt to be overlying soft tissue and chronic lung disease although the patient may have small bilateral pleural effusions as well.  IMPRESSION: Chronic cardiomegaly.  Possible small bilateral effusions.   Electronically Signed   By: Lorriane Shire M.D.   On: 02/19/2015 07:05    EKG: Atrial fibrillation with RVR, heart rate 122 bpm, nonspecific ST-T changes  Cardiac Studies: 2D Echo 11/01/2014: Study Conclusions  - Left ventricle: The cavity size was mildly dilated. Wall thickness was increased in a pattern of mild LVH. Systolic function was severely reduced. The estimated ejection fraction was in the range of 25% to 30%. Diffuse hypokinesis. -  Ventricular septum: Septal motion showed paradox. - Mitral valve: There was mild regurgitation. - Left atrium: The atrium was moderately dilated. - Right atrium: The atrium was moderately dilated.  Impressions:  - Patient is significantly tachycardic 130-140 throughout the study.  ASSESSMENT AND PLAN:  1. Acute on chronic systolic heart failure 2. New onset atrial fibrillation with RVR 3. Severe Non-ischemic cardiomyopathy 4. Type 2 diabetes 5. Hypertensive heart disease 6. Tobacco abuse 7. Elevated troponin  Multiple issues as above. Suggest IV diuresis with furosemide to treat CHF with evidence of volume overload. Continue carvedilol for heart rate control of atrial fibrillation. He's not a good candidate for diltiazem because of cardiomyopathy. I reviewed old EKG's and he was in atrial flutter with RVR 11/01/2014. I suspect he has had PAF considering his high risk with obesity, cardiomyopathy, and HTN. If he does not convert spontaneously, would consider antiarrhythmic drug Rx to aid in rhythm control. Amiodarone may be his only reasonable option considering severe LV dysfunction. The patient should be on chronic oral anticoagulation and I would recommend starting a direct oral anticoagulant drug to aid with compliance. Will start Xarelto 20 mg daily. His CHADS-Vasc = 4 (age - 55, DM - 1, HTN - 1, CHF - 1).   This patients CHA2DS2-VASc Score and unadjusted Ischemic Stroke Rate (% per year) is equal to 4.8 % stroke rate/year from a score of 4  Above score calculated as 1 point each if present [CHF, HTN, DM, Vascular=MI/PAD/Aortic Plaque, Age if 65-74, or Male] Above score calculated as 2 points each if present [Age > 75, or Stroke/TIA/TE]  Regarding his decompensated heart failure with volume overload, will initiate IV lasix 40 mg BID. Suspect this is exacerbated by ongoing tobacco, obesity, and now atrial fibrillation.   The patient's elevated troponin is a marker of his general  illness and heart failure and does not require further evaluation at this time. Note the patient had no significant CAD at recent cath.  We will follow with you - thanks  Signed, Sherren Mocha MD, [facility] 02/19/2015, 9:48 PM

## 2015-02-19 NOTE — ED Provider Notes (Signed)
CSN: [id number]     Arrival date & time 02/19/15  0536 History   First MD Initiated Contact with Patient 02/19/15 0559     Chief Complaint  Patient presents with  . Shortness of Breath     (Consider location/radiation/quality/duration/timing/severity/associated sxs/prior Treatment) HPI Comments: Pt comes in with cc of shortness of breath. LEVEL 5 CAVEAT FOR RESPIRATORY DISTRESS NEEDING BIPAP.  Pt has history of hypertension, diabetes, hyperlipidemia, COPD and CHF (EF 25%) who comes in with cc of acute shortness of breath. Pt reports that he started getting shortness of breath few hours prior to ER arrival, and symptoms started getting worse, so he came to the ER . EMS reports that when they arrived, pt was tachypneic, diophoretic, and was somnolent. They started pt on bipap, gave him magnesium, duonebs x 2 and solumedrol, and patient has become more alert. PT denies chest pain.  Patient is a 69 y.o. male presenting with shortness of breath. The history is provided by the patient. The history is limited by the condition of the patient.  Shortness of Breath   Past Medical History  Diagnosis Date  . Hypertension   . Diabetes mellitus without complication   . Hypercholesteremia   . Acute systolic CHF (congestive heart failure) 11/02/2014   Past Surgical History  Procedure Laterality Date  . Left and right heart catheterization with coronary angiogram N/A 11/06/2014    Procedure: LEFT AND RIGHT HEART CATHETERIZATION WITH CORONARY ANGIOGRAM;  Surgeon: Jettie Booze, MD;  [facility] LAB;  Service: Cardiovascular;  Laterality: N/A;   Family History  Problem Relation Age of Onset  . Heart disease Mother   . Heart disease Father    History  Substance Use Topics  . Smoking status: Current Some Day Smoker -- 0.05 packs/day    Types: Cigarettes  . Smokeless tobacco: Never Used  . Alcohol Use: No    Review of Systems  Unable to perform ROS: Severe respiratory distress   Respiratory: Positive for shortness of breath.       Allergies  Review of patient's allergies indicates no known allergies.  Home Medications   Prior to Admission medications   Medication Sig Start Date End Date Taking? Authorizing Provider  albuterol (PROVENTIL HFA;VENTOLIN HFA) 108 (90 BASE) MCG/ACT inhaler Inhale 2 puffs into the lungs every 6 (six) hours as needed for wheezing or shortness of breath. 11/07/14   Kinnie Feil, MD  aspirin 81 MG chewable tablet Chew 1 tablet (81 mg total) by mouth daily. 11/07/14   Kinnie Feil, MD  carvedilol (COREG) 12.5 MG tablet Take 1 tablet (12.5 mg total) by mouth 2 (two) times daily with a meal. 02/13/15   Jettie Booze, MD  furosemide (LASIX) 40 MG tablet Take 1 tablet (40 mg total) by mouth daily. 02/13/15   Jettie Booze, MD  glipiZIDE (GLUCOTROL) 10 MG tablet Take 20 mg by mouth 2 (two) times daily before a meal.    Historical Provider, MD  lisinopril (PRINIVIL,ZESTRIL) 20 MG tablet Take 1 tablet (20 mg total) by mouth daily. 02/13/15   Jettie Booze, MD  metFORMIN (GLUCOPHAGE) 1000 MG tablet Take 1,000 mg by mouth 2 (two) times daily with a meal.    Historical Provider, MD  methadone (DOLOPHINE) 10 MG tablet Take 10 mg by mouth every 12 (twelve) hours.    Historical Provider, MD  oxyCODONE-acetaminophen (PERCOCET) 10-325 MG per tablet Take 1 tablet by mouth every 6 (six) hours as needed for pain (pain).  Historical Provider, MD  potassium chloride SA (K-DUR,KLOR-CON) 20 MEQ tablet Take 2 tablets (40 mEq total) by mouth daily. 11/07/14   Kinnie Feil, MD  rosuvastatin (CRESTOR) 20 MG tablet Take 20 mg by mouth daily.    Historical Provider, MD  theophylline (UNIPHYL) 400 MG 24 hr tablet Take 200 mg by mouth daily.    Historical Provider, MD  Vitamin D, Ergocalciferol, (DRISDOL) 50000 UNITS CAPS capsule Take 50,000 Units by mouth every 7 (seven) days. On Sunday.    Historical Provider, MD   BP 119/70 mmHg  Pulse 84   Temp(Src) 97.4 F (36.3 C) (Axillary)  Resp 16  SpO2 92% Physical Exam  Constitutional: He appears well-developed.  HENT:  Head: Atraumatic.  Eyes: Conjunctivae are normal.  Neck: Neck supple.  Cardiovascular:  tachycardia  Pulmonary/Chest: He is in respiratory distress. He has no wheezes. He has no rales.  Abdominal: Soft. He exhibits distension. There is no tenderness.  Neurological: He is alert.  Skin: Skin is warm. He is not diaphoretic.  Nursing note and vitals reviewed.   ED Course  Procedures (including critical care time) Labs Review Labs Reviewed  CBC WITH DIFFERENTIAL/PLATELET - Abnormal; Notable for the following:    Hemoglobin 12.7 (*)    RDW 15.8 (*)    All other components within normal limits  BASIC METABOLIC PANEL - Abnormal; Notable for the following:    Glucose, Bld 354 (*)    GFR calc non Af Amer 83 (*)    All other components within normal limits  TROPONIN I - Abnormal; Notable for the following:    Troponin I 0.04 (*)    All other components within normal limits  BRAIN NATRIURETIC PEPTIDE - Abnormal; Notable for the following:    B Natriuretic Peptide 811.2 (*)    All other components within normal limits    Imaging Review Dg Chest Port 1 View  02/19/2015   CLINICAL DATA:  Shortness of breath.  COPD.  Asthma.  EXAM: PORTABLE CHEST - 1 VIEW  COMPARISON:  11/03/2014 and 10/31/2014  FINDINGS: Chronic cardiomegaly. Pulmonary vascularity is normal. Haziness at the lung bases is felt to be overlying soft tissue and chronic lung disease although the patient may have small bilateral pleural effusions as well.  IMPRESSION: Chronic cardiomegaly.  Possible small bilateral effusions.   Electronically Signed   By: Lorriane Shire M.D.   On: 02/19/2015 07:05     EKG Interpretation   Date/Time:  Monday February 19 2015 05:42:17 EDT Ventricular Rate:  103 PR Interval:  173 QRS Duration: 111 QT Interval:  407 QTC Calculation: 533 R Axis:   86 Text Interpretation:   Sinus tachycardia Borderline right axis deviation  Nonspecific T abnrm, anterolateral leads Prolonged QT interval No  significant change since last tracing Confirmed by Kathrynn Humble, MD, Caydence Koenig  [phone]) on 02/19/2015 6:01:00 AM      MDM   Final diagnoses:  Shortness of breath  COPD with acute exacerbation    Pt comes in with cc of DIB. Acute dyspnea, with no wheezing, and BP not severely elevated. Initial dx is Flash pulmonary edema, COPD exacerbation. Unlikely ACS or PTX, PE. Pt stable on bipap. Will give 1 more duonebs and reassess. Basic labs ordered.    _0 :45 - Pt's respiratory status is improved. He is in no distress anymore. Will d.c bipap. _1 :30 am - tolerating nasal canula. Feels a lot better. We will admit obs - copd exacerbation.  CRITICAL CARE Performed by: Varney Biles  Total critical care time: 40 min  Critical care time was exclusive of separately billable procedures and treating other patients.  Critical care was necessary to treat or prevent imminent or life-threatening deterioration.  Critical care was time spent personally by me on the following activities: development of treatment plan with patient and/or surrogate as well as nursing, discussions with consultants, evaluation of patient's response to treatment, examination of patient, obtaining history from patient or surrogate, ordering and performing treatments and interventions, ordering and review of laboratory studies, ordering and review of radiographic studies, pulse oximetry and re-evaluation of patient's condition.     Varney Biles, MD 02/19/15 [phone]

## 2015-02-19 NOTE — Progress Notes (Signed)
ANTICOAGULATION CONSULT NOTE - Initial Consult  Pharmacy Consult for xarelto Indication: atrial fibrillation  No Known Allergies  Patient Measurements: Height: 6' (182.9 cm) Weight: 264 lb 5.3 oz (119.9 kg) IBW/kg (Calculated) : 77.6   Vital Signs: Temp: 98.7 F (37.1 C) (04/18 1900) Temp Source: Oral (04/18 1900) BP: 140/85 mmHg (04/18 2200) Pulse Rate: 75 (04/18 2200)  Labs:  Recent Labs  02/19/15 0600 02/19/15 1327 02/19/15 1830  HGB 12.7*  --   --   HCT 41.4  --   --   PLT 397  --   --   CREATININE 0.99  --   --   TROPONINI 0.04* 0.20* 0.26*    Estimated Creatinine Clearance: 98.1 mL/min (by C-G formula based on Cr of 0.99).   Medical History: Past Medical History  Diagnosis Date  . Hypertension   . Diabetes mellitus without complication   . Hypercholesteremia   . Acute systolic CHF (congestive heart failure) 11/02/2014    Medications:  Prescriptions prior to admission  Medication Sig Dispense Refill Last Dose  . albuterol (PROVENTIL HFA;VENTOLIN HFA) 108 (90 BASE) MCG/ACT inhaler Inhale 2 puffs into the lungs every 6 (six) hours as needed for wheezing or shortness of breath. 1 Inhaler 2 02/18/2015 at Unknown time  . aspirin 81 MG chewable tablet Chew 1 tablet (81 mg total) by mouth daily.   02/18/2015 at Unknown time  . carvedilol (COREG) 12.5 MG tablet Take 1 tablet (12.5 mg total) by mouth 2 (two) times daily with a meal. 60 tablet 11 02/18/2015 at 0900  . furosemide (LASIX) 40 MG tablet Take 1 tablet (40 mg total) by mouth daily. 30 tablet 11 02/18/2015 at Unknown time  . glipiZIDE (GLUCOTROL) 10 MG tablet Take 20 mg by mouth 2 (two) times daily before a meal.   02/18/2015 at Unknown time  . levofloxacin (LEVAQUIN) 500 MG tablet Take 500 mg by mouth daily.  0 02/18/2015 at Unknown time  . lisinopril (PRINIVIL,ZESTRIL) 20 MG tablet Take 1 tablet (20 mg total) by mouth daily. 30 tablet 11 02/18/2015 at Unknown time  . metFORMIN (GLUCOPHAGE) 1000 MG tablet Take  1,000 mg by mouth 2 (two) times daily with a meal.   02/18/2015 at Unknown time  . methadone (DOLOPHINE) 10 MG tablet Take 10 mg by mouth every 12 (twelve) hours.   02/18/2015 at Unknown time  . oxyCODONE-acetaminophen (PERCOCET) 10-325 MG per tablet Take 1 tablet by mouth every 6 (six) hours as needed for pain (pain).    02/18/2015 at Unknown time  . potassium chloride SA (K-DUR,KLOR-CON) 20 MEQ tablet Take 2 tablets (40 mEq total) by mouth daily. 30 tablet 0 02/18/2015 at Unknown time  . rosuvastatin (CRESTOR) 20 MG tablet Take 20 mg by mouth daily.   02/18/2015 at Unknown time  . theophylline (UNIPHYL) 400 MG 24 hr tablet Take 200 mg by mouth daily.   02/18/2015 at Unknown time  . Vitamin D, Ergocalciferol, (DRISDOL) 50000 UNITS CAPS capsule Take 50,000 Units by mouth every 7 (seven) days. On Sunday.   Past Week at Unknown time    Assessment: 69 yo M with new onset Afib with RVR.  Pharmacy consulted to dose Xarelto for afib. Wt ~ 120 kg; CBC wnl.  Creat cl > 50 ml/min.  No bleeding reported.   Goal of Therapy:  Stroke prevention   Plan:  Dc LMWH 40 qday Xarelto 20 mg daily with supper, start tonight Will educate patient prior to discharge  Eudelia Bunch, Pharm.D. [phone]  02/19/2015 10:48 PM

## 2015-02-19 NOTE — ED Notes (Signed)
Internal medicine at bedside, pt given water per Dr. Eula Fried and pt tolerated well.

## 2015-02-19 NOTE — Progress Notes (Signed)
Pt c/o pain but is refusing PRN tylenol.  MD advised RN to offer tylenol and apply VOLTAREN to area of pain.

## 2015-02-19 NOTE — Progress Notes (Addendum)
IMTS Progress Note:   S: Called for A fib with RVR. Patient also reporting some pain as he is on methadone and percocet chronically for hip pain. Otherwise, patient denying any chest pain, dizziness, increased dyspnea, or palpitations. Patient denying any history of palpitations.   O:  Filed Vitals:   02/19/15 1430 02/19/15 1445 02/19/15 1500 02/19/15 1600  BP: 136/67 146/93 148/86   Pulse: 86 93 97 122  Temp:      TempSrc:      Resp: _0 Height:    6' (1.829 m)  Weight:    264 lb 5.3 oz (119.9 kg)  SpO2: 94% 97% 98% 97%   General: resting in bed, in no acute distress HEENT: PERRL, EOMI, no scleral icterus Cardiac: tachycardic, regular, no murmurs, rubs, or gallops Pulm: mild bibasilar rales, wheezing improved over the interval Abd: soft, nontender, nondistended, BS present Ext: warm and well perfused, no pedal edema Neuro: alert and oriented X3, cranial nerves II-XII grossly intact  A/P: Patient seems to be in sinus rhythm at upon interview and exam. Home beta-blockers have not yet been administered during this admission. Compensatory etiologies for sinus tachycardia such as pulmonary embolism and pericardial disease seem to be less likely at this point given lack of chest pain and maintenance of adequate blood pressures. Troponins elevated to 0.2 from 0.04 earlier today, which brings up some concern for tachycardia in the setting of demand ischemia.  - Continue home methadone at a decreased dose of 5 mg BID.  - Start with 2.5 mg metoprolol IV push and resume home carvedilol one hour afterwards.    Addendum: 02/19/15: Since patient's urine drug screen was negative for opiates, we will hold his methadone for now.

## 2015-02-19 NOTE — Care Management Note (Addendum)
    Page 1 of 2   02/23/2015     4:32:40 PM CARE MANAGEMENT NOTE 02/23/2015  Patient:  Nicholas Caldwell, Nicholas Caldwell   Account Number:  [id number]  Date Initiated:  02/19/2015  Documentation initiated by:  Elissa Hefty  Subjective/Objective Assessment:   adm w copd, heart failure, has home o2     Action/Plan:   lives w wife, pcp dr Cindra Eves   Anticipated DC Date:  02/23/2015   Anticipated DC Plan:  [place]  CM consult      [facility] Choice  DURABLE MEDICAL EQUIPMENT   Choice offered to / List presented to:  C-1 Patient   DME arranged  [facility]      DME agency  [facility]   Status of service:  Completed, signed off Medicare Important Message given?  YES (If response is "NO", the following Medicare IM given date fields will be blank) Date Medicare IM given:  02/22/2015 Medicare IM given by:  Tomi Bamberger Date Additional Medicare IM given:   Additional Medicare IM given by:    Discharge Disposition:  [place]  Per UR Regulation:  Reviewed for med. necessity/level of care/duration of stay  If discussed at Long Length of Stay Meetings, dates discussed:    Comments:  02-23-15 O2 set up with [facility], Nicholas Caldwell notified. DC to  home today or tomorrow. Carles Collet RN BSN CM  02/22/15 [place], BSN [phone] patient for poss dc tomorrow, Cards would like to diures some more and started amiodarone.  02/21/15 [place], BSN [phone] patient has agreed to [facility] services.  He lives with spouse, on iv lasix, nebs, had afib last pm.  NCM will cont to follow for dc needs.

## 2015-02-19 NOTE — H&P (Signed)
Date: 02/19/2015               Patient Name:  Nicholas Caldwell MRN: [id number]  DOB: 1946/05/01 Age / Sex: 69 y.o., male   PCP: Sofie Rower, PA-C         Medical Service: Internal Medicine Teaching Service         Attending Physician: Dr. Madilyn Fireman, MD    First Contact: Dr. Raelene Bott Pager: [phone]  Second Contact: Dr. Gordy Levan Pager: [phone]       After Hours (After 5p/  First Contact Pager: [phone]  weekends / holidays): Second Contact Pager: [phone]   Chief Complaint: SOB  History of Present Illness: Nicholas Caldwell is a 69 year old smoker obese male with non-obstructive CAD (cath 01/05/58), systolic CHF (EF 74-16%), HTN, and DM2 who presented to the ED today via EMS with complaints of worsening SOB.  History was mainly obtained by wife who is present in the room as patient was in and out of sleep.  Wife reports that she went to bed around 2am and he woke up and sat on the side of the bed trying to put his oxygen on. She then woke up around 4am, and found him in his chair but unresponsive. She says he was breathing and eyes open, but appeared to have trouble breathing and was sweating.  She then called EMS.  He was noted to be diaphoretic with increased work of breathing and treated with 2 duoneb treatments, solumedrol 181m iv x1, and placed on BIPAP in ED. Breathing improved since in ED and transitioned to [place] O2 4L.  Upon my interview, Nicholas Caldwell in and out of sleep but was more awake by the end of the interview.  Both wife and patient cannot state how much oxygen he is on at home which he seems to use PRN. He does remember going to the cardiologist office last week and reports taking increased lasix dose bid x3 days but was back to daily dosing and last took his medications yesterday.  He reports day leading up to admission was a "bad day" due to breathing, increased dyspnea with exertion, and sleeps with up to 5 pillows at night.  He is a daily smoker but wife says he is trying to cut back.    Of note, wife states he was told he recently had PNA by PCP but does not recall if antibiotics were given. He currently denies any chest pain, abdominal pain, dysuria, diarrhea or constipation.   Meds: No current facility-administered medications for this encounter.   Current Outpatient Prescriptions  Medication Sig Dispense Refill  . albuterol (PROVENTIL HFA;VENTOLIN HFA) 108 (90 BASE) MCG/ACT inhaler Inhale 2 puffs into the lungs every 6 (six) hours as needed for wheezing or shortness of breath. 1 Inhaler 2  . aspirin 81 MG chewable tablet Chew 1 tablet (81 mg total) by mouth daily.    . carvedilol (COREG) 12.5 MG tablet Take 1 tablet (12.5 mg total) by mouth 2 (two) times daily with a meal. 60 tablet 11  . furosemide (LASIX) 40 MG tablet Take 1 tablet (40 mg total) by mouth daily. 30 tablet 11  . glipiZIDE (GLUCOTROL) 10 MG tablet Take 20 mg by mouth 2 (two) times daily before a meal.    . lisinopril (PRINIVIL,ZESTRIL) 20 MG tablet Take 1 tablet (20 mg total) by mouth daily. 30 tablet 11  . metFORMIN (GLUCOPHAGE) 1000 MG tablet Take 1,000 mg by mouth 2 (two) times daily with a  meal.    . methadone (DOLOPHINE) 10 MG tablet Take 10 mg by mouth every 12 (twelve) hours.    Marland Kitchen oxyCODONE-acetaminophen (PERCOCET) 10-325 MG per tablet Take 1 tablet by mouth every 6 (six) hours as needed for pain (pain).     . potassium chloride SA (K-DUR,KLOR-CON) 20 MEQ tablet Take 2 tablets (40 mEq total) by mouth daily. 30 tablet 0  . rosuvastatin (CRESTOR) 20 MG tablet Take 20 mg by mouth daily.    . theophylline (UNIPHYL) 400 MG 24 hr tablet Take 200 mg by mouth daily.    . Vitamin D, Ergocalciferol, (DRISDOL) 50000 UNITS CAPS capsule Take 50,000 Units by mouth every 7 (seven) days. On Sunday.      Allergies: Allergies as of 02/19/2015  . (No Known Allergies)   Past Medical History  Diagnosis Date  . Hypertension   . Diabetes mellitus without complication   . Hypercholesteremia   . Acute systolic  CHF (congestive heart failure) 11/02/2014   Past Surgical History  Procedure Laterality Date  . Left and right heart catheterization with coronary angiogram N/A 11/06/2014    Procedure: LEFT AND RIGHT HEART CATHETERIZATION WITH CORONARY ANGIOGRAM;  Surgeon: Jettie Booze, MD;  Location[facility] C[facility] LAB;  Service: Cardiovascular;  Laterality: N/A;   Family History  Problem Relation Age of Onset  . Heart disease Mother   . Heart disease Father    History   Social History  . Marital Status: Single    Spouse Name: N/A  . Number of Children: N/A  . Years of Education: N/A   Occupational History  . Not on file.   Social History Main Topics  . Smoking status: Current Some Day Smoker -- 0.05 packs/day    Types: Cigarettes  . Smokeless tobacco: Never Used  . Alcohol Use: No  . Drug Use: No  . Sexual Activity: Not on file   Other Topics Concern  . Not on file   Social History Narrative   Review of Systems:  Constitutional:  Sleepy  HEENT:  Thirsty  Respiratory:  SOB, DOE  Cardiovascular:  Denies chest pain  Gastrointestinal:  Denies abdominal pain, diarrhea, constipation  Genitourinary:  Denies dysuria. +frequency  Musculoskeletal:  Chronic hip pain  Neurological:  Somnolent   Physical Exam: Blood pressure 119/70, pulse 84, temperature 97.4 F (36.3 C), temperature source Axillary, resp. rate 16, SpO2 92 %. Vitals reviewed. General: sitting up in bed, in and out of sleep but easily arousable HEENT: EOMI Cardiac: RRR Pulm: rales left >right, decreased breath sounds at bases, mild expiratory wheezing Abd: soft, distended, nontender, BS present Ext: warm and well perfused, +1 pitting edema b/l Neuro: alert and oriented to person, DOB, and place (knows hospital but cannot recall name). Did not know correct day of week. Arousable and following commands--moving extremities and hand squeeze and responding to questions. +asterixis. Strength equal in b/l lower extremities  compared to upper extremities, finger to nose in tact but mild tremor noted on pointing, able to shrug shoulders and close eyes tightly and frown, puff cheeks minimally, unable to stick tongue out all the way with noted shaking/tremor.  Lab results: Basic Metabolic Panel:  Recent Labs  02/19/15 0600  NA 137  K 4.3  CL 100  CO2 26  GLUCOSE 354*  BUN 16  CREATININE 0.99  CALCIUM 8.4   CBC:  Recent Labs  02/19/15 0600  WBC 7.6  NEUTROABS 3.7  HGB 12.7*  HCT 41.4  MCV 87.3  PLT 397  Cardiac Enzymes:  Recent Labs  02/19/15 0600  TROPONINI 0.04*   Imaging results:  Dg Chest Port 1 View  02/19/2015   CLINICAL DATA:  Shortness of breath.  COPD.  Asthma.  EXAM: PORTABLE CHEST - 1 VIEW  COMPARISON:  11/03/2014 and 10/31/2014  FINDINGS: Chronic cardiomegaly. Pulmonary vascularity is normal. Haziness at the lung bases is felt to be overlying soft tissue and chronic lung disease although the patient may have small bilateral pleural effusions as well.  IMPRESSION: Chronic cardiomegaly.  Possible small bilateral effusions.   Electronically Signed   By: Lorriane Shire M.D.   On: 02/19/2015 07:05   Other results: EKG: 103 sinus tachycardia, prolonged qtc 533, TWI aVL and V2  Assessment & Plan by Problem: Principal Problem:   Acute respiratory failure Active Problems:   Hypertension   Diabetes mellitus type 2 in obese   Smoker   Elevated troponin I level   Acute on chronic systolic CHF (congestive heart failure)   Dyspnea   Morbid obesity   Acute respiratory acidosis   Chronic pain   Prolonged Q-T interval on ECG   Acute encephalopathy  Acute hypoxic respiratory failure with acute encephalopathy--likely multifactorial. Could be in setting of acute on chronic systolic CHF (BNP increased to 811 from 388 11/2014) vs. ?COPD exacerbation (no prior PFTs but is smoker and had some mild wheezing on exam) vs. OHS/OSA vs. Medication induced (on chronic opiates including methadone and  percocet for chronic hip pain). Patient was initially placed on BiPAP on arrival to ED but with improved respiratory status, transitioned to 4L [place] O2. Noted to desat 87% on room air. Respiratory status reportedly improved after solumedrol and breathing treatments. PNA could be in differential given respiratory distress, however afebrile, no leukocytosis, ?small b/l effusions, but wife states patient was told he recently had PNA at PCP office (verified by PCP office, given 10 day course of levaquin 523m x10 days--although patient is not currently on antibiotics). PE is also included in differential although seems less likely at this time given presentation. Modified geneva score of 3 with low probability.  -admit to SDU (patient remains lethargic and will need to monitor respiratory status closely) -ABG--acute respiratory acidosis ABG    Component Value Date/Time   PHART 7.268* 02/19/2015 1003   PCO2ART 56.3* 02/19/2015 1003   PO2ART 58.0* 02/19/2015 1003   HCO3 25.8* 02/19/2015 1003   TCO2 27 02/19/2015 1003   ACIDBASEDEF 2.0 02/19/2015 1003   O2SAT 85.0 02/19/2015 1003  -[place] O2, keep o2 sat >92% for now, mental status may prevent being able to transition to BiPAP again if needed -lasix 447miv x1 -daily weights (cards office weight 4/12: 267lb's and last hospital discharge weight 259lb's) -strict I/O's -duonebz q4h prn -consider repeat echo -cycle CE initial troponin positive 0.04, ?demand ischemia -NPO for now, monitor mental status -need to obtain records from PCP in regards to medications--awaiting fax of records -monitor for opiate withdrawal, holding PO medications for now given mental status and sedation -neuro checks -UDS -repeat CXR in AM  Acute encephalopathy--likely in setting of hypoxia and possible sedation with chronic opiates. Mental status slowly improving, following commands and responding to questions appropriately, but will need to be monitored closely.  -neuro  checks -hold sedating medications, monitor for opiate withdrawal -consider head CT if any change in mental status -NPO  Non-obstructive CAD with mild elevation of troponin in setting of systolic CHF--cath 1/1/8841ith mild diffuse non-obstructive CAD, severely decreased LV systolic function EF 2566-06%  and mild pulmonary artery hypertension. On asa, coreg 12.62m bid, lasix 449mdaily, lisinopril 2079maily, and crestor 22m51mily at home. Initial trop mildly positive 0.04, TIMI 3.  Patient currently denies any chest pain but says he has had chest pain in the past.  -currently NPO given mental status -consider cardiology consultation -will try to diurese with lasix -continue home ACEi and BB--monitor respiratory status, HR, and renal function -ASA 325mg67m-continue ASA and statin -cycle CE  Uncontrolled DM2--last HbA1C 10.1 10/2014. Patient now reports being on januvia and possibly still on metformin and glipizide. Hyperglycemia on admission with glucose 354-->trending down.  -monitor CBG's -SSI if remains hyperglycemia -records from PCP -repeat A1C  HTN--on coreg, lisinopril, and lasix at home.  -continue home medications  Chronic hip pain--on methadone 10mg 2mand percocet 10-325mg q24mrn. Patient reports needing R hip operation once diabetes better controlled.  -holding PO meds for now given sedation but will need to monitor closely for possible opiate withdrawal and resume gradually -trying to obtain records from pain management, per PCP office methadone prescribed by Dr. ScheutzJorene Minors to office  Prolonged qtc--533 on admission EKG.  -AM EKG and tele monitoring -Avoid QTC prolonging medications  Diet: NPO DVT Ppx: Lovenox Dispo: Disposition is deferred at this time, awaiting improvement of current medical problems. Anticipated discharge in approximately 2-3 day(s).   The patient does have a current PCP (O'Laf Massenburg, PA-C) and [facility].  The patient does not have transportation limitations that hinder transportation to clinic appointments.  Signed: Ramal Eckhardt Wilber Oliphant18/2016, 9:24 AM

## 2015-02-19 NOTE — ED Notes (Signed)
Pt requesting pain medications for chronic hip pain, this RN explained to pt that due to mental status that sedating medications have not been ordered for pt. Pt verbalized understanding, pt states he would like something to eat/drink. Admitting doctor paged.

## 2015-02-19 NOTE — ED Notes (Addendum)
EMS reports called to home for increased SOB this morning 3 hr PTA; EMS reports pt was diaphoretic and "breathing so hard he couldn't speak, he was almost unresponsive" upon arrival on scene; pt denies CP at this time; EMS reports giving 2 duoneb treatments and 174m solumedrol enroute; pt arrived to ED with CPAP

## 2015-02-20 ENCOUNTER — Inpatient Hospital Stay (HOSPITAL_COMMUNITY): Payer: Medicare Other

## 2015-02-20 DIAGNOSIS — G9340 Encephalopathy, unspecified: Secondary | ICD-10-CM

## 2015-02-20 DIAGNOSIS — R7989 Other specified abnormal findings of blood chemistry: Secondary | ICD-10-CM

## 2015-02-20 DIAGNOSIS — I2510 Atherosclerotic heart disease of native coronary artery without angina pectoris: Secondary | ICD-10-CM

## 2015-02-20 DIAGNOSIS — J9601 Acute respiratory failure with hypoxia: Secondary | ICD-10-CM

## 2015-02-20 DIAGNOSIS — M25559 Pain in unspecified hip: Secondary | ICD-10-CM

## 2015-02-20 DIAGNOSIS — I5020 Unspecified systolic (congestive) heart failure: Secondary | ICD-10-CM

## 2015-02-20 DIAGNOSIS — I10 Essential (primary) hypertension: Secondary | ICD-10-CM

## 2015-02-20 DIAGNOSIS — E1165 Type 2 diabetes mellitus with hyperglycemia: Secondary | ICD-10-CM

## 2015-02-20 DIAGNOSIS — I4892 Unspecified atrial flutter: Secondary | ICD-10-CM | POA: Diagnosis present

## 2015-02-20 DIAGNOSIS — I4581 Long QT syndrome: Secondary | ICD-10-CM

## 2015-02-20 DIAGNOSIS — R740 Nonspecific elevation of levels of transaminase and lactic acid dehydrogenase [LDH]: Secondary | ICD-10-CM

## 2015-02-20 LAB — GLUCOSE, CAPILLARY
GLUCOSE-CAPILLARY: 174 mg/dL — AB (ref 70–99)
GLUCOSE-CAPILLARY: 169 mg/dL — AB (ref 70–99)
GLUCOSE-CAPILLARY: 217 mg/dL — AB (ref 70–99)
Glucose-Capillary: 156 mg/dL — ABNORMAL HIGH (ref 70–99)

## 2015-02-20 LAB — BASIC METABOLIC PANEL
ANION GAP: 9 (ref 5–15)
Anion gap: 11 (ref 5–15)
BUN: 18 mg/dL (ref 6–23)
BUN: 19 mg/dL (ref 6–23)
CALCIUM: 9 mg/dL (ref 8.4–10.5)
CHLORIDE: 96 mmol/L (ref 96–112)
CO2: 30 mmol/L (ref 19–32)
CO2: 33 mmol/L — ABNORMAL HIGH (ref 19–32)
CREATININE: 1.05 mg/dL (ref 0.50–1.35)
Calcium: 9 mg/dL (ref 8.4–10.5)
Chloride: 98 mmol/L (ref 96–112)
Creatinine, Ser: 0.97 mg/dL (ref 0.50–1.35)
GFR calc Af Amer: 83 mL/min — ABNORMAL LOW (ref >90)
GFR calc non Af Amer: 72 mL/min — ABNORMAL LOW (ref >90)
GFR, EST NON AFRICAN AMERICAN: 84 mL/min — AB (ref >90)
Glucose, Bld: 156 mg/dL — ABNORMAL HIGH (ref 70–99)
Glucose, Bld: 197 mg/dL — ABNORMAL HIGH (ref 70–99)
POTASSIUM: 3.3 mmol/L — AB (ref 3.5–5.1)
Potassium: 4.2 mmol/L (ref 3.5–5.1)
Sodium: 138 mmol/L (ref 135–145)
Sodium: 139 mmol/L (ref 135–145)

## 2015-02-20 LAB — HIV ANTIBODY (ROUTINE TESTING W REFLEX): HIV Screen 4th Generation wRfx: NONREACTIVE

## 2015-02-20 LAB — HEMOGLOBIN A1C
Hgb A1c MFr Bld: 9.8 % — ABNORMAL HIGH (ref 4.8–5.6)
Mean Plasma Glucose: 235 mg/dL

## 2015-02-20 LAB — TROPONIN I
Troponin I: 0.3 ng/mL — ABNORMAL HIGH (ref <0.031)
Troponin I: 0.38 ng/mL — ABNORMAL HIGH (ref <0.031)

## 2015-02-20 MED ORDER — POTASSIUM CHLORIDE CRYS ER 20 MEQ PO TBCR
40.0000 meq | EXTENDED_RELEASE_TABLET | Freq: Once | ORAL | Status: AC
  Administered 2015-02-20: 40 meq via ORAL
  Filled 2015-02-20: qty 2

## 2015-02-20 NOTE — Progress Notes (Signed)
Inpatient Diabetes Program Recommendations  AACE/ADA: New Consensus Statement on Inpatient Glycemic Control (2013)  Target Ranges:  Prepandial:   less than 140 mg/dL      Peak postprandial:   less than 180 mg/dL (1-2 hours)      Critically ill patients:  140 - 180 mg/dL  Results for CLANCE, BAQUERO (MRN [id number]) as of 02/20/2015 08:12  Ref. Range 02/19/2015 11:33 02/19/2015 14:51 02/19/2015 16:38 02/19/2015 21:07  Glucose-Capillary Latest Ref Range: 70-99 mg/dL 285 (H) 270 (H) 312 (H) 274 (H)   Results for AODHAN, SCHEIDT (MRN [id number]) as of 02/20/2015 08:12  Ref. Range 10/31/2014 23:45 02/19/2015 12:21  Hemoglobin A1C Latest Ref Range: 4.8-5.6 % 10.1 (H) 9.8 (H)   Diabetes history: DM2 Outpatient Diabetes medications: Glipizide 20 mg BID, Metformin 1000 mg BID Current orders for Inpatient glycemic control: Novolog 0-9 units TID with meals, Novolog 0-5 units HS  Inpatient Diabetes Program Recommendations Correction (SSI): Please consider increasing Novolog correction to Resistant scale. Oral Agents: If appropriate for patient, may want to consider resuming home DM oral medications as an inpatient.  Note: In reviewing the chart, noted patient was hospitalized from 10/31/14 to 11/07/14 and was ordered Novolog 0-15 units TID, Novolog 0-5 units HS, Novolog 4 units TID with meals, Metformin 1000 mg BID, and Glipizide 20 mg BID as an inpatient and CBGs ranged from 112-221 mg/dl. Patient was seen by diabetes coordinator on 11/01/14 and pt was hostile with coordinator and not willing to take insulin as an outpatient (see note by J. Rosebud Poles, RN, Diabetes Coordinator dated 11/01/14 for additional details). At this time, please consider increasing Novolog correction to resistant scale and may want to consider resuming outpatient DM oral medications if appropriate at this time.   Thanks, Barnie Alderman, RN, MSN, CCRN, CDE Diabetes Coordinator Inpatient Diabetes Program [phone] (Team Pager from [place] to  [place]) [phone] ([facility] office) [phone] [facility] office)

## 2015-02-20 NOTE — Progress Notes (Addendum)
SUBJECTIVE:  Feeling better   OBJECTIVE:   Vitals:   Filed Vitals:   02/20/15 0718 02/20/15 0800 02/20/15 0900 02/20/15 1000  BP: 112/55 116/54 94/50   Pulse: 72 78 82   Temp: 98.3 F (36.8 C)     TempSrc: Oral     Resp: _0 Height:      Weight:      SpO2: 99% 100% 100%    I&O's:   Intake/Output Summary (Last 24 hours) at 02/20/15 1036 Last data filed at 02/20/15 0400  Gross per 24 hour  Intake    480 ml  Output   3500 ml  Net  -3020 ml   TELEMETRY: Reviewed telemetry pt in NSR     PHYSICAL EXAM General: Well developed, well nourished, in no acute distress Head: Eyes PERRLA, No xanthomas.   Normal cephalic and atramatic  Lungs:   Crackles at bases bilaterally Heart:   HRRR S1 S2 Pulses are 2+ & equal. Abdomen: Bowel sounds are positive, abdomen soft and non-tender without masses  Extremities:   No clubbing, cyanosis or edema.  DP +1 Neuro: Alert and oriented X 3. Psych:  Good affect, responds appropriately   LABS: Basic Metabolic Panel:  Recent Labs  02/19/15 0600 02/19/15 2350  NA 137 139  K 4.3 4.2  CL 100 98  CO2 26 30  GLUCOSE 354* 197*  BUN 16 18  CREATININE 0.99 1.05  CALCIUM 8.4 9.0   Liver Function Tests: No results for input(s): AST, ALT, ALKPHOS, BILITOT, PROT, ALBUMIN in the last 72 hours. No results for input(s): LIPASE, AMYLASE in the last 72 hours. CBC:  Recent Labs  02/19/15 0600  WBC 7.6  NEUTROABS 3.7  HGB 12.7*  HCT 41.4  MCV 87.3  PLT 397   Cardiac Enzymes:  Recent Labs  02/19/15 1327 02/19/15 1830 02/19/15 2350  TROPONINI 0.20* 0.26* 0.38*   BNP: Invalid input(s): POCBNP D-Dimer: No results for input(s): DDIMER in the last 72 hours. Hemoglobin A1C:  Recent Labs  02/19/15 1221  HGBA1C 9.8*   Fasting Lipid Panel: No results for input(s): CHOL, HDL, LDLCALC, TRIG, CHOLHDL, LDLDIRECT in the last 72 hours. Thyroid Function Tests: No results for input(s): TSH, T4TOTAL, T3FREE, THYROIDAB in the  last 72 hours.  Invalid input(s): FREET3 Anemia Panel: No results for input(s): VITAMINB12, FOLATE, FERRITIN, TIBC, IRON, RETICCTPCT in the last 72 hours. Coag Panel:   Lab Results  Component Value Date   INR 1.03 11/06/2014    RADIOLOGY: Dg Chest Port 1 View  02/19/2015   CLINICAL DATA:  Shortness of breath.  COPD.  Asthma.  EXAM: PORTABLE CHEST - 1 VIEW  COMPARISON:  11/03/2014 and 10/31/2014  FINDINGS: Chronic cardiomegaly. Pulmonary vascularity is normal. Haziness at the lung bases is felt to be overlying soft tissue and chronic lung disease although the patient may have small bilateral pleural effusions as well.  IMPRESSION: Chronic cardiomegaly.  Possible small bilateral effusions.   Electronically Signed   By: Lorriane Shire M.D.   On: 02/19/2015 07:05    ASSESSMENT AND PLAN:  1. Acute on chronic systolic heart failure - he is net 3.7L negative today.  Good UOP yesterday. He is down 3lbs.   2. Paroxysmal Atrial flutter with RVR - review of EKGs dating back to 10/31/2014 show that he was in and out of atrial flutter.  Initial EKG 02/19/2015 with sinus. I do not see any EKGs in the past with atrial fibrillation.  The EKG  on 11/01/2014 1908 shows typical flutter.  Will get an EP consult to discuss possible flutter ablation.  Continue carvedilol for HR control.  Avoid CCB due to LV dysfunction.  Continue Xarelto for  3. Severe Non-ischemic cardiomyopathy EF 25-30%- most likely tachycardia induced. It appears as though he has had paroxysmal atrial fibrillation with RVR dating back to the time of his echo in 10/2014.  This patients CHA2DS2-VASc Score and unadjusted Ischemic Stroke Rate (% per year) is equal to 4.8 % stroke rate/year from a score of 4 Above score calculated as 1 point each if present [CHF, HTN, DM, Vascular=MI/PAD/Aortic Plaque, Age if 65-74, or Male] Above score calculated as 2 points each if present [Age > 75, or Stroke/TIA/TE] 4. Type 2 diabetes 5. Hypertensive heart  disease - BP soft this am 6. Tobacco abuse 7. Elevated troponin most likely secondary to demand ischemia from rapid atrial flutter. The patient's elevated troponin is a marker of his general illness and heart failure and does not require further evaluation at this time. Note the patient had no significant CAD at recent cath.    This patients CHA2DS2-VASc Score and unadjusted Ischemic Stroke Rate (% per year) is equal to 4.8 % stroke rate/year from a score of 4  Above score calculated as 1 point each if present [CHF, HTN, DM, Vascular=MI/PAD/Aortic Plaque, Age if 65-74, or Male] Above score calculated as 2 points each if present [Age > 75, or Stroke/TIA/TE]   OK to transfer to tele bed  Sueanne Margarita, MD  02/20/2015  10:36 AM

## 2015-02-20 NOTE — H&P (Addendum)
Initial Encounter - History and Physical                        History of Present Illness                                               This is a 69 y.o. male who has past medical history as mentioned below, who comes in with acute respiratory failure, unresponsive. He was treated for Acute CHF with lasix and COPD exacerbation with solumedrol and duonebs and he responded to treatment. He reported that he was diagnosed with pneumonia 1 week ago by his PCP, and was started on some antibiotic, however he took it only for 2 days. He is convinced that he has pneumonia, while we so far have not found any evidence of it. This morning when I met him, the patient was alert, oriented and comfortable without oxygen.  Past Medical History                                                        Hypertension  Diabetes type 2  Hyperlipidemia  Acute CHF  Non-obstructive CAD  Multiple pneumonias (per patient report)  Atrial Flutter 10/31/14 EKG  Social and Family History                                                Married, lives with spouse. Heart disease in both mother and father.  Smoker. No alcohol or drug use reported.  Review of Systems                                                          Review of Systems  Constitutional: Negative for fever, chills, malaise/fatigue and diaphoresis.  Respiratory: Positive for cough and wheezing. Negative for hemoptysis.   Cardiovascular: Positive for leg swelling. Negative for chest pain, palpitations, orthopnea, claudication and PND.  Gastrointestinal: Negative for nausea, vomiting, diarrhea and constipation.  Genitourinary: Negative for dysuria.  Musculoskeletal: Negative for myalgias and falls.  Skin: Negative for rash.  Neurological: Positive for dizziness. Negative for tingling, tremors and weakness.  Psychiatric/Behavioral: Negative for depression. The patient is nervous/anxious.     Home Medications                                                            Albuterol inhaler  Aspirin 81 daily  Carvedilol 12.36m BID  Furosemide 40 mg daily  Glipizide 20 mg twice daily  Lisinopril 20 mg daily  Metformin 10064mtwice daily  Methadone 10 mg twice daily  Oxucodone-Acetaminophen Q6PRN  Potassium Chloride 4034mdaily  Rosuvastatin 75m25mily  Theophylline 400 mg 24hr  tablet  Vitamin D 50000 units weekly  Physical Exam                                                                 Physical Exam  Constitutional: He is oriented to person, place, and time. No distress.  Obese, sitting up in bed, conversing without difficulty, not wearing oxygen  HENT:  Head: Normocephalic and atraumatic.  Mouth/Throat: Oropharynx is clear and moist.  Eyes: Conjunctivae are normal. Pupils are equal, round, and reactive to light. Right eye exhibits no discharge. Left eye exhibits no discharge.  Neck: Normal range of motion. Neck supple.  Cardiovascular: Normal rate, regular rhythm, normal heart sounds and intact distal pulses.   No murmur heard. Pulmonary/Chest: Effort normal. No respiratory distress. He has wheezes (ocassional). He has rales (minor basilar). He exhibits no tenderness.  Abdominal: Soft. Bowel sounds are normal.  Musculoskeletal: He exhibits edema (trace pedal edema).  Neurological: He is alert and oriented to person, place, and time. No cranial nerve deficit. Coordination normal.  Skin: Skin is warm. He is not diaphoretic.   Pertinent Labs                                                                  CBC trend  Lab 02/19/15 0600  HGB 12.7*  HCT 41.4  WBC 7.6  PLT 397    BMP Trend  Lab 02/19/15 0600 02/19/15 2350  NA 137 139  K 4.3 4.2  CL 100 98  CO2 26 30  GLUCOSE 354* 197*  BUN 16 18  CREATININE 0.99 1.05  CALCIUM 8.4 9.0     Troponin Trend  Lab 02/19/15 0600 02/19/15 1327 02/19/15 1830 02/19/15 2350  TROPONINI 0.04* 0.20* 0.26* 0.38*   UA Not done this admission  Pertinent Imaging                                                             EKG on admission - Sinus tachycardia, some interventricular delay, prolonged QTc 545m,   CXR - Chronic cardiomegaly. Pulmonary vascularity is normal. Haziness at the lung bases is felt to be overlying soft tissue and chronic lung disease although the patient may have small bilateral pleural effusions as well.   Assessment and Plan                                                     I agree with the documentation in the resident note. Here are my additions:   Acute respiratory failure: Likely a mix of COPD and CHF exacerbation. The patient does not have PFTs on record, and it would be ideal to get them. Since the patient has responded very  well to duonebs, we can continue those and keep him off steroids given the acute CHF right now. For CHF, we will continue diuresis, can switch to oral lasix. He is down 3.7l since yesterday. He has an echo from December 2015 with EF of 25-30%. Cardiology on board and following. Appreciate recommendations.   Tachyrhythmia: He has had an EKG showing atrial flutter, and this admission an EKG with possible atrial fibrillation. Dr Tressia Miners Turner's note reflects that she is planning to talk to EP about possible ablation. For now, Xarelto has been started and will be continued. Theophylline - I am not sure why he is on this medication and if this could have a bearing on cardiac arrhythmia. The patient does not recall being on it. We will call his pharmacy and double check. If he is still taking it, we will do a theophylline level.   Type 2 NSTEMI: Troponins trended up, still following, likely demand ischemia.   Diabetes type 2: He has uncontrolled DM2 with an A1c of 9.8. He is on glipizide and metformin at home. We will counsel the patient regarding increasing current glipizide dose, managing his diet, losing weight, and possibly consider switching to insulin for better control.   Possible pneumonia? Patient concern: So  far, we do not think that the patient has any symptoms of pneumonia. His CXR is clear for any infiltrate. I spent more than 45 minutes with the patient this morning, discussing his various diagnosis and allaying his concerns about pneumonia. We will do a repeat chest xray today to see if any infiltrates show up after hydration.  Hyperlipidemia: LDL 72 from 11/01/2014. He is on a statin.   Methadone therapy: The patient has not taken his methadone for a couple days. He has a clear UDS. We will not continue it.   Please see the rest of the chronic conditions in the resident note.  I have discussed the plan of care for this patient with my resident team. Please see their note for further details.  Janifer Gieselman 02/20/2015 1:14 PM

## 2015-02-20 NOTE — Progress Notes (Signed)
Subjective:  Patient states that he was not able to sleep well last night. He states that his hip pain is still bothering him. Patient denies any chest pain, shortness of breath, abdominal pain.  Objective: Vital signs in last 24 hours: Filed Vitals:   02/20/15 0800 02/20/15 0900 02/20/15 1000 02/20/15 1137  BP: 116/54 94/50  141/85  Pulse: 78 82    Temp:    98.4 F (36.9 C)  TempSrc:    Oral  Resp: _0 Height:      Weight:      SpO2: 100% 100%  98%   Weight change:   Intake/Output Summary (Last 24 hours) at 02/20/15 1242 Last data filed at 02/20/15 0400  Gross per 24 hour  Intake    480 ml  Output   3250 ml  Net  -2770 ml    General: resting in bed, sitting upright, in no acute distress HEENT: PERRL, EOMI, no scleral icterus Cardiac: RRR, no rubs, murmurs or gallops Pulm: Mild bibasilar rales Abd: soft, nontender, nondistended, BS present Ext: warm and well perfused, no pedal edema Neuro: alert and oriented X3, cranial nerves II-XII grossly intact Skin: no rashes or lesions noted Psych: appropriate affect  Lab Results: Basic Metabolic Panel:  Recent Labs Lab 02/19/15 0600 02/19/15 2350  NA 137 139  K 4.3 4.2  CL 100 98  CO2 26 30  GLUCOSE 354* 197*  BUN 16 18  CREATININE 0.99 1.05  CALCIUM 8.4 9.0   Liver Function Tests: No results for input(s): AST, ALT, ALKPHOS, BILITOT, PROT, ALBUMIN in the last 168 hours. No results for input(s): LIPASE, AMYLASE in the last 168 hours. No results for input(s): AMMONIA in the last 168 hours. CBC:  Recent Labs Lab 02/19/15 0600  WBC 7.6  NEUTROABS 3.7  HGB 12.7*  HCT 41.4  MCV 87.3  PLT 397   Cardiac Enzymes:  Recent Labs Lab 02/19/15 1327 02/19/15 1830 02/19/15 2350  TROPONINI 0.20* 0.26* 0.38*   BNP: No results for input(s): PROBNP in the last 168 hours. D-Dimer: No results for input(s): DDIMER in the last 168 hours. CBG:  Recent Labs Lab 02/19/15 1133 02/19/15 1451  02/19/15 1638 02/19/15 2107 02/20/15 0717 02/20/15 1132  GLUCAP 285* 270* 312* 274* 174* 156*   Hemoglobin A1C:  Recent Labs Lab 02/19/15 1221  HGBA1C 9.8*   Fasting Lipid Panel: No results for input(s): CHOL, HDL, LDLCALC, TRIG, CHOLHDL, LDLDIRECT in the last 168 hours. Thyroid Function Tests: No results for input(s): TSH, T4TOTAL, FREET4, T3FREE, THYROIDAB in the last 168 hours. Coagulation: No results for input(s): LABPROT, INR in the last 168 hours. Anemia Panel: No results for input(s): VITAMINB12, FOLATE, FERRITIN, TIBC, IRON, RETICCTPCT in the last 168 hours. Urine Drug Screen: Drugs of Abuse     Component Value Date/Time   LABOPIA NONE DETECTED 02/19/2015 1010   COCAINSCRNUR NONE DETECTED 02/19/2015 1010   LABBENZ POSITIVE* 02/19/2015 1010   AMPHETMU NONE DETECTED 02/19/2015 1010   THCU NONE DETECTED 02/19/2015 1010   LABBARB NONE DETECTED 02/19/2015 1010    Alcohol Level: No results for input(s): ETH in the last 168 hours. Urinalysis: No results for input(s): COLORURINE, LABSPEC, PHURINE, GLUCOSEU, HGBUR, BILIRUBINUR, KETONESUR, PROTEINUR, UROBILINOGEN, NITRITE, LEUKOCYTESUR in the last 168 hours.  Invalid input(s): APPERANCEUR  Micro Results: Recent Results (from the past 240 hour(s))  MRSA PCR Screening     Status: None   Collection Time: 02/19/15  3:40 AM  Result Value Ref Range Status  MRSA by PCR NEGATIVE NEGATIVE Final    Comment:        The GeneXpert MRSA Assay (FDA approved for NASAL specimens only), is one component of a comprehensive MRSA colonization surveillance program. It is not intended to diagnose MRSA infection nor to guide or monitor treatment for MRSA infections.    Studies/Results: Dg Chest Port 1 View  02/19/2015   CLINICAL DATA:  Shortness of breath.  COPD.  Asthma.  EXAM: PORTABLE CHEST - 1 VIEW  COMPARISON:  11/03/2014 and 10/31/2014  FINDINGS: Chronic cardiomegaly. Pulmonary vascularity is normal. Haziness at the lung  bases is felt to be overlying soft tissue and chronic lung disease although the patient may have small bilateral pleural effusions as well.  IMPRESSION: Chronic cardiomegaly.  Possible small bilateral effusions.   Electronically Signed   By: Lorriane Shire M.D.   On: 02/19/2015 07:05   Medications: I have reviewed the patient's current medications. Scheduled Meds: . carvedilol  12.5 mg Oral BID WC  . diclofenac sodium  2 g Topical QID  . furosemide  40 mg Intravenous BID  . insulin aspart  0-5 Units Subcutaneous QHS  . insulin aspart  0-9 Units Subcutaneous TID WC  . ipratropium-albuterol  3 mL Nebulization TID  . lisinopril  20 mg Oral Daily  . rivaroxaban  20 mg Oral Q supper  . rosuvastatin  20 mg Oral Daily  . sodium chloride  3 mL Intravenous Q12H   Continuous Infusions:  PRN Meds:.acetaminophen Assessment/Plan: Principal Problem:   Acute respiratory failure Active Problems:   Hypertension   Diabetes mellitus type 2 in obese   Smoker   Elevated troponin I level   Acute on chronic systolic CHF (congestive heart failure)   Dyspnea   Morbid obesity   Acute respiratory acidosis   Chronic pain   Prolonged Q-T interval on ECG   Acute encephalopathy   NSTEMI (non-ST elevated myocardial infarction)   Atrial flutter with rapid ventricular response  Paroxysmal atrial flutter with RVR: Patient currently in normal sinus rhythm with no tachycardia upon exam. EKG from December 2015 showing atrial flutter.patient with several episodes of sinus tachycardia and atrial flutter with RVR yesterday. -Xarelto given a CHA2DS2-VASc of 4. -Cardiology to consult electrophysiology for possible flutter ablation -Will avoid calcium channel blockers due to depressed systolic ejection fraction.  Demand ischemia: Patient continues to deny any chest pain or shortness of breath today. His troponins had a mild uptrend overnight to 0.38. Patient has a history of a catheterization in January 2016 with mild  diffuse nonobstructive CAD. Patient has a echocardiogram with an ejection fraction of 25-30%. At home, patient is on aspirin, Coreg 12.5 mg twice a day, Lasix 40 mg daily, lisinopril 20 mg daily, and Crestor 20 mg daily. -Appreciate cardiology recommendations -No further evaluation for elevated troponins needed at this time per cardiology -Will continue to trend troponins until we see a down trend  Acute hypoxic respiratory failure in the setting of systolic congestive heart failure: Mostly resolved as patient is satting well on room air. Initially thought to be secondary to acute on chronic systolic congestive heart failure (history of depressed ejection fraction of 25-30%) in addition to some component of COPD exacerbation due to mild wheezing. There is also some possibility that this could have been medication induced as patient was positive for benzodiazepines upon admission.patient is 3.7 L net negative for this admission. -Transfer to telemetry -Continue Lasix 40 mg twice a day  Acute encephalopathy: Patient initially presenting with  sedation in the setting of hypoxia. Patient held back to baseline mental status.  Chronic hip pain: Patient reports that he is on methadone 10 mg twice a day and Percocet 10-325 mg every 6 hours as needed. He states that he has waiting for a right hip operation once his diabetes is better controlled. After a discussion with his pain clinic, it seems that patient has been discharged from his pain clinic for a pain contract violation. Patient was prescribed methadone on 01/19/2015 and since then, has obtained tramadol prescriptions from the [facility]. Because of this, the pain clinic will no longer see the patient. Pain clinic also reports that patient has a previous history of similar behavior and had been discharged from another pain clinic in the past. Interestingly, patient's urine drug screen was negative for opiates upon admission. Although initial communication  with the lab indicated that methadone does show up on our urine drug screen, a second conversation confirms that a very high level of methadone would be needed in order for the patient to test positive for opiates on urine drug screen. -Given negative opiates on  Uncontrolled type 2 diabetes: Patient with blood glucose in the 100s over last 24 hours.patient is on Januvia, metformin, and glipizide at home. Hemoglobin A1c from December 2015 of 10.1. -sensitive sliding scale insulin -repeat hemoglobin A1c.   Hypertension: Patient with slightly lower blood pressures intermittently.  -Continue with lisinopril 20 mg daily. -continue with Coreg 12.5 mg twice a day. -Continue with Lasix 40 mg twice a day    LOS: 1 day   Services Needed at time of discharge: Y = Yes, Blank = No PT:   OT:   RN:   Equipment:   Other:    Luan Moore, MD 02/20/2015, 12:42 PM

## 2015-02-20 NOTE — Progress Notes (Signed)
Patient trasfered from [facility] to [phone] via wheelchair; alert and oriented x 4; no complaints of pain; IV saline locked in RH; skin intact. Orient patient to room and unit; instructed how to use the call bell and  fall risk precautions. Will continue to monitor the patient.

## 2015-02-20 NOTE — Consult Note (Signed)
ELECTROPHYSIOLOGY CONSULT NOTE    Patient ID: Nicholas Caldwell MRN: [id number], DOB/AGE: [birth year] 69 y.o.  Admit date: 02/19/2015 Date of Consult: 02/20/2015  Primary Physician: [name] Primary Cardiologist: [name] Referring Physician: Radford Pax  Reason for Consultation: atrial arrhythmias  HPI:  Nicholas Caldwell is a 69 y.o. male with a past medical history significant for hypertension, diabetes, hyperlipidemia, and non-ischemic cardiomyopathy.  His cardiomyopathy was first diagnosed in December of 2015 when he presented with increased shortness of breath.  Cardiac catheterization at that time demonstrated no obstructive CAD.  He has been managed medically and required high doses of oral diuretics.  He developed worsening shortness of breath over the last 2 weeks and presented to the ER for evaluation.  He was found to be in AF with RVR and he was admitted for further management.  EKG's from December of 2015 demonstrate typical atrial flutter.   Echo 10/2014 demonstrated EF 25-30%, diffuse hypokinesis, mild MR, LA 49.  EP has been asked to evaluate for treatment options.   Past Medical History  Diagnosis Date  . Hypertension   . Diabetes mellitus without complication   . Hypercholesteremia   . Acute systolic CHF (congestive heart failure) 11/02/2014     Surgical History:  Past Surgical History  Procedure Laterality Date  . Left and right heart catheterization with coronary angiogram N/A 11/06/2014    Procedure: LEFT AND RIGHT HEART CATHETERIZATION WITH CORONARY ANGIOGRAM;  Surgeon: Jettie Booze, MD;  Location: [facility] CATH LAB;  Service: Cardiovascular;  Laterality: N/A;     Prescriptions prior to admission  Medication Sig Dispense Refill Last Dose  . albuterol (PROVENTIL HFA;VENTOLIN HFA) 108 (90 BASE) MCG/ACT inhaler Inhale 2 puffs into the lungs every 6 (six) hours as needed for wheezing or shortness of breath. 1 Inhaler 2 02/18/2015 at Unknown time  . aspirin 81 MG  chewable tablet Chew 1 tablet (81 mg total) by mouth daily.   02/18/2015 at Unknown time  . carvedilol (COREG) 12.5 MG tablet Take 1 tablet (12.5 mg total) by mouth 2 (two) times daily with a meal. 60 tablet 11 02/18/2015 at 0900  . furosemide (LASIX) 40 MG tablet Take 1 tablet (40 mg total) by mouth daily. 30 tablet 11 02/18/2015 at Unknown time  . glipiZIDE (GLUCOTROL) 10 MG tablet Take 20 mg by mouth 2 (two) times daily before a meal.   02/18/2015 at Unknown time  . levofloxacin (LEVAQUIN) 500 MG tablet Take 500 mg by mouth daily.  0 02/18/2015 at Unknown time  . lisinopril (PRINIVIL,ZESTRIL) 20 MG tablet Take 1 tablet (20 mg total) by mouth daily. 30 tablet 11 02/18/2015 at Unknown time  . metFORMIN (GLUCOPHAGE) 1000 MG tablet Take 1,000 mg by mouth 2 (two) times daily with a meal.   02/18/2015 at Unknown time  . methadone (DOLOPHINE) 10 MG tablet Take 10 mg by mouth every 12 (twelve) hours.   02/18/2015 at Unknown time  . oxyCODONE-acetaminophen (PERCOCET) 10-325 MG per tablet Take 1 tablet by mouth every 6 (six) hours as needed for pain (pain).    02/18/2015 at Unknown time  . potassium chloride SA (K-DUR,KLOR-CON) 20 MEQ tablet Take 2 tablets (40 mEq total) by mouth daily. 30 tablet 0 02/18/2015 at Unknown time  . rosuvastatin (CRESTOR) 20 MG tablet Take 20 mg by mouth daily.   02/18/2015 at Unknown time  . theophylline (UNIPHYL) 400 MG 24 hr tablet Take 200 mg by mouth daily.   02/18/2015 at Unknown time  . Vitamin D, Ergocalciferol, (  DRISDOL) 50000 UNITS CAPS capsule Take 50,000 Units by mouth every 7 (seven) days. On Sunday.   Past Week at Unknown time    Inpatient Medications:  . carvedilol  12.5 mg Oral BID WC  . diclofenac sodium  2 g Topical QID  . furosemide  40 mg Intravenous BID  . insulin aspart  0-5 Units Subcutaneous QHS  . insulin aspart  0-9 Units Subcutaneous TID WC  . ipratropium-albuterol  3 mL Nebulization TID  . lisinopril  20 mg Oral Daily  . rivaroxaban  20 mg Oral Q supper    . rosuvastatin  20 mg Oral Daily  . sodium chloride  3 mL Intravenous Q12H    Allergies: No Known Allergies  History   Social History  . Marital Status: Single    Spouse Name: N/A  . Number of Children: N/A  . Years of Education: N/A   Occupational History  . Not on file.   Social History Main Topics  . Smoking status: Current Some Day Smoker -- 0.05 packs/day    Types: Cigarettes  . Smokeless tobacco: Never Used  . Alcohol Use: No  . Drug Use: No  . Sexual Activity: Not on file   Other Topics Concern  . Not on file   Social History Narrative     Family History  Problem Relation Age of Onset  . Heart disease Mother   . Heart disease Father      Review of Systems: General: No chills, fever,  + night sweats or weight changes  Cardiovascular:  No chest pain, edema, orthopnea, palpitations, paroxysmal nocturnal dyspnea Dermatological: No rash, lesions or masses Respiratory: +DOE, Non productive cough  Urologic: No hematuria, dysuria Abdominal: No nausea, vomiting, diarrhea, bright red blood per rectum, melena, or hematemesis Neurologic: No visual changes, weakness, changes in mental status All other systems reviewed and are otherwise negative except as noted above.  Physical Exam: Filed Vitals:   02/20/15 0800 02/20/15 0900 02/20/15 1000 02/20/15 1137  BP: 116/54 94/50  141/85  Pulse: 78 82    Temp:    98.4 F (36.9 C)  TempSrc:    Oral  Resp: _0 Height:      Weight:      SpO2: 100% 100%  98%    GEN- The patient is well appearing, alert and oriented x 3 today.   HEENT: normocephalic, atraumatic; sclera clear, conjunctiva pink; hearing intact; oropharynx clear; neck supple  Lungs- Decreased breath sounds throughout, Clear to ausculation bilaterally, slightly increased WOB Heart- Regular rate and rhythm, no murmurs, rubs or gallops  GI- soft, non-tender, non-distended, bowel sounds present  Extremities- no clubbing, cyanosis, or edema  MS- no  significant deformity or atrophy Skin- warm and dry, no rash or lesion Psych- euthymic mood, full affect Neuro- strength and sensation are intact  Labs:   Lab Results  Component Value Date   WBC 7.6 02/19/2015   HGB 12.7* 02/19/2015   HCT 41.4 02/19/2015   MCV 87.3 02/19/2015   PLT 397 02/19/2015    Recent Labs Lab 02/19/15 2350  NA 139  K 4.2  CL 98  CO2 30  BUN 18  CREATININE 1.05  CALCIUM 9.0  GLUCOSE 197*      Radiology/Studies: Dg Chest Port 1 View 02/19/2015   CLINICAL DATA:  Shortness of breath.  COPD.  Asthma.  EXAM: PORTABLE CHEST - 1 VIEW  COMPARISON:  11/03/2014 and 10/31/2014  FINDINGS: Chronic cardiomegaly. Pulmonary vascularity is normal. Haziness  at the lung bases is felt to be overlying soft tissue and chronic lung disease although the patient may have small bilateral pleural effusions as well.  IMPRESSION: Chronic cardiomegaly.  Possible small bilateral effusions.   Electronically Signed   By: Lorriane Shire M.D.   On: 02/19/2015 07:05    EKG:SR, rate 84, LAE, QTc 529 EKG 02/19/15 - coarse AF, ventricular rate 122 EKG 11/01/14 - typical atrial flutter, ventricular rate 116   TELEMETRY: sinus rhythm with intermittent atrial fibrillation/atrial flutter  Assessment/Plan: 1.  Atrial fibrillation/flutter The patient has both atrial fibrillation and typical atrial flutter.  He is appropriately anticoagulated with Xarelto for CHADS2VASC score of at least 4 (started 02/19/15).  He also has a cardiomyopathy diagnosed in December of 2015 that is potentially tachycardia mediated.  He is currently in SR.  LA size is 49 and the likelihood of maintaining SR long term is reduced.  He is not a candidate for Flecainide with cardiomyopathy.  QTc prolonged this admission but in the setting of recent Levaquin use, may be able to use Tikosyn with reassessment of QTc after Levaquin washout.  Ablation is an option, but success rates are reduced due to LA size.  Will ask research to  screen patient for the Genetic AF study. If not a candidate, will consider Tikosyn after QTc reassessed (pt is self pay for medications and so would need case management to look into patient assistance program).     2.  Non ischemic cardiomyopathy Cath in 10/2014 demonstrated non obstructive CAD Cardiomyopathy felt to be tachycardia mediated Continue Coreg, Lisinopril  3.  HTN Stable No change required today  4.  Obesity Weight loss will reduce atrial arrhythmia burden long term  Signed, Chanetta Marshall, NP 02/20/2015 12:45 PM  I have seen, examined the patient, and reviewed the above assessment and plan.  Changes to above are made where necessary.  Given atrial enlargement as well as documented afib and atrial flutter, I think that antiarrhythmic therapy is probable the most prudent next step.  He is clear that he would like to avoid ablation at this time. He may be a candidate for Genetic AF.  I will therefore ask [place] research team to evaluate.  If he is not a candidate for genetic AF, tikosyn could be considered.  Will repeat EKG as his prior EKG revealed QT prolongation on levaquin.   Co Sign: Thompson Grayer, MD 02/20/2015 5:01 PM

## 2015-02-21 DIAGNOSIS — I480 Paroxysmal atrial fibrillation: Secondary | ICD-10-CM

## 2015-02-21 DIAGNOSIS — I4892 Unspecified atrial flutter: Secondary | ICD-10-CM

## 2015-02-21 LAB — GLUCOSE, CAPILLARY
GLUCOSE-CAPILLARY: 152 mg/dL — AB (ref 70–99)
Glucose-Capillary: 151 mg/dL — ABNORMAL HIGH (ref 70–99)
Glucose-Capillary: 179 mg/dL — ABNORMAL HIGH (ref 70–99)
Glucose-Capillary: 169 mg/dL — ABNORMAL HIGH (ref 70–99)

## 2015-02-21 LAB — BASIC METABOLIC PANEL
ANION GAP: 10 (ref 5–15)
BUN: 13 mg/dL (ref 6–23)
CO2: 34 mmol/L — AB (ref 19–32)
CREATININE: 0.9 mg/dL (ref 0.50–1.35)
Calcium: 9.4 mg/dL (ref 8.4–10.5)
Chloride: 96 mmol/L (ref 96–112)
GFR calc non Af Amer: 87 mL/min — ABNORMAL LOW (ref >90)
Glucose, Bld: 160 mg/dL — ABNORMAL HIGH (ref 70–99)
POTASSIUM: 4 mmol/L (ref 3.5–5.1)
Sodium: 140 mmol/L (ref 135–145)

## 2015-02-21 LAB — THEOPHYLLINE LEVEL: Theophylline Lvl: 1.7 ug/mL — ABNORMAL LOW (ref 10.0–20.0)

## 2015-02-21 LAB — BRAIN NATRIURETIC PEPTIDE: B Natriuretic Peptide: 705.3 pg/mL — ABNORMAL HIGH (ref 0.0–100.0)

## 2015-02-21 LAB — MAGNESIUM: Magnesium: 1.8 mg/dL (ref 1.5–2.5)

## 2015-02-21 MED ORDER — LEVALBUTEROL TARTRATE 45 MCG/ACT IN AERO: 1.0000 | INHALATION_SPRAY | Freq: Four times a day (QID) | RESPIRATORY_TRACT | Status: DC | PRN

## 2015-02-21 MED ORDER — SPIRONOLACTONE 12.5 MG HALF TABLET
12.5000 mg | ORAL_TABLET | Freq: Every day | ORAL | Status: DC
  Administered 2015-02-21 – 2015-02-23 (×3): 12.5 mg via ORAL
  Filled 2015-02-21 (×3): qty 1

## 2015-02-21 MED ORDER — INSULIN ASPART 100 UNIT/ML ~~LOC~~ SOLN
0.0000 [IU] | Freq: Three times a day (TID) | SUBCUTANEOUS | Status: DC
  Administered 2015-02-21 – 2015-02-22 (×4): 4 [IU] via SUBCUTANEOUS
  Administered 2015-02-22 – 2015-02-23 (×2): 7 [IU] via SUBCUTANEOUS
  Administered 2015-02-23: 4 [IU] via SUBCUTANEOUS

## 2015-02-21 MED ORDER — GLIPIZIDE 10 MG PO TABS
20.0000 mg | ORAL_TABLET | Freq: Two times a day (BID) | ORAL | Status: DC
  Administered 2015-02-21 – 2015-02-23 (×4): 20 mg via ORAL
  Filled 2015-02-21 (×6): qty 2

## 2015-02-21 MED ORDER — LEVALBUTEROL HCL 0.63 MG/3ML IN NEBU: 0.6300 mg | INHALATION_SOLUTION | Freq: Four times a day (QID) | RESPIRATORY_TRACT | Status: DC | PRN

## 2015-02-21 MED ORDER — FUROSEMIDE 10 MG/ML IJ SOLN
40.0000 mg | Freq: Three times a day (TID) | INTRAMUSCULAR | Status: DC
  Administered 2015-02-21 – 2015-02-22 (×3): 40 mg via INTRAVENOUS
  Filled 2015-02-21 (×3): qty 4

## 2015-02-21 MED ORDER — GUAIFENESIN ER 600 MG PO TB12
1200.0000 mg | ORAL_TABLET | Freq: Two times a day (BID) | ORAL | Status: DC
  Administered 2015-02-21 – 2015-02-23 (×5): 1200 mg via ORAL
  Filled 2015-02-21 (×6): qty 2

## 2015-02-21 MED ORDER — CARVEDILOL 12.5 MG PO TABS
18.7500 mg | ORAL_TABLET | Freq: Two times a day (BID) | ORAL | Status: DC
Start: 2015-02-21 — End: 2015-02-23
  Administered 2015-02-21 – 2015-02-23 (×4): 18.75 mg via ORAL
  Filled 2015-02-21 (×6): qty 1

## 2015-02-21 NOTE — Progress Notes (Signed)
Subjective:  Patient again is reporting that he was not able to sleep well last night. He has been having some mucus buildup with some minimal cough overnight. Otherwise, patient is not reporting any complaints.  Objective: Vital signs in last 24 hours: Filed Vitals:   02/20/15 2233 02/21/15 0120 02/21/15 0540 02/21/15 0846  BP: 135/75 144/94 144/63   Pulse: 72 106 60   Temp: 98.2 F (36.8 C) 98.3 F (36.8 C) 98.7 F (37.1 C)   TempSrc: Oral Oral Oral   Resp: _0 Height:      Weight:      SpO2: 97% 93% 99% 91%   Weight change:   Intake/Output Summary (Last 24 hours) at 02/21/15 1123 Last data filed at 02/21/15 0931  Gross per 24 hour  Intake    720 ml  Output   1075 ml  Net   -355 ml   General: resting in bed, sitting upright, in no acute distress HEENT: PERRL, EOMI, no scleral icterus Cardiac: RRR, no rubs, murmurs or gallops Pulm: Mild bibasilar rales Abd: soft, nontender, nondistended, BS present Ext: warm and well perfused, no pedal edema Neuro: alert and oriented X3, cranial nerves II-XII grossly intact Skin: no rashes or lesions noted Psych: appropriate affect  Lab Results: Basic Metabolic Panel:  Recent Labs Lab 02/20/15 1242 02/21/15 0734  NA 138 140  K 3.3* 4.0  CL 96 96  CO2 33* 34*  GLUCOSE 156* 160*  BUN 19 13  CREATININE 0.97 0.90  CALCIUM 9.0 9.4  MG  --  1.8   Liver Function Tests: No results for input(s): AST, ALT, ALKPHOS, BILITOT, PROT, ALBUMIN in the last 168 hours. No results for input(s): LIPASE, AMYLASE in the last 168 hours. No results for input(s): AMMONIA in the last 168 hours. CBC:  Recent Labs Lab 02/19/15 0600  WBC 7.6  NEUTROABS 3.7  HGB 12.7*  HCT 41.4  MCV 87.3  PLT 397   Cardiac Enzymes:  Recent Labs Lab 02/19/15 1830 02/19/15 2350 02/20/15 1242  TROPONINI 0.26* 0.38* 0.30*   BNP: No results for input(s): PROBNP in the last 168 hours. D-Dimer: No results for input(s): DDIMER in the last 168  hours. CBG:  Recent Labs Lab 02/19/15 2107 02/20/15 0717 02/20/15 1132 02/20/15 1716 02/20/15 2230 02/21/15 0808  GLUCAP 274* 174* 156* 169* 217* 151*   Hemoglobin A1C:  Recent Labs Lab 02/19/15 1221  HGBA1C 9.8*   Fasting Lipid Panel: No results for input(s): CHOL, HDL, LDLCALC, TRIG, CHOLHDL, LDLDIRECT in the last 168 hours. Thyroid Function Tests: No results for input(s): TSH, T4TOTAL, FREET4, T3FREE, THYROIDAB in the last 168 hours. Coagulation: No results for input(s): LABPROT, INR in the last 168 hours. Anemia Panel: No results for input(s): VITAMINB12, FOLATE, FERRITIN, TIBC, IRON, RETICCTPCT in the last 168 hours. Urine Drug Screen: Drugs of Abuse     Component Value Date/Time   LABOPIA NONE DETECTED 02/19/2015 1010   COCAINSCRNUR NONE DETECTED 02/19/2015 1010   LABBENZ POSITIVE* 02/19/2015 1010   AMPHETMU NONE DETECTED 02/19/2015 1010   THCU NONE DETECTED 02/19/2015 1010   LABBARB NONE DETECTED 02/19/2015 1010    Alcohol Level: No results for input(s): ETH in the last 168 hours. Urinalysis: No results for input(s): COLORURINE, LABSPEC, PHURINE, GLUCOSEU, HGBUR, BILIRUBINUR, KETONESUR, PROTEINUR, UROBILINOGEN, NITRITE, LEUKOCYTESUR in the last 168 hours.  Invalid input(s): APPERANCEUR  Micro Results: Recent Results (from the past 240 hour(s))  MRSA PCR Screening     Status: None  Collection Time: 02/19/15  3:40 AM  Result Value Ref Range Status   MRSA by PCR NEGATIVE NEGATIVE Final    Comment:        The GeneXpert MRSA Assay (FDA approved for NASAL specimens only), is one component of a comprehensive MRSA colonization surveillance program. It is not intended to diagnose MRSA infection nor to guide or monitor treatment for MRSA infections.    Studies/Results: Dg Chest 2 View  02/20/2015   CLINICAL DATA:  Respiratory distress and fever.  EXAM: CHEST  2 VIEW  COMPARISON:  February 19, 2015.  FINDINGS: Stable cardiomegaly. No pneumothorax or pleural  effusion is noted. Stable mild central pulmonary vascular congestion is noted. Some degree of perihilar and basilar edema cannot be excluded. Ossification of anterior longitudinal ligament is noted in lower thoracic spine.  IMPRESSION: Stable cardiomegaly and central pulmonary vascular congestion is noted suggesting congestive heart failure. Some degree of bilateral perihilar and basilar edema may be present.   Electronically Signed   By: Marijo Conception, M.D.   On: 02/20/2015 16:27   Medications: I have reviewed the patient's current medications. Scheduled Meds: . carvedilol  18.75 mg Oral BID WC  . diclofenac sodium  2 g Topical QID  . furosemide  40 mg Intravenous TID  . glipiZIDE  20 mg Oral BID AC  . guaiFENesin  1,200 mg Oral BID  . insulin aspart  0-20 Units Subcutaneous TID WC  . insulin aspart  0-5 Units Subcutaneous QHS  . ipratropium-albuterol  3 mL Nebulization TID  . lisinopril  20 mg Oral Daily  . rivaroxaban  20 mg Oral Q supper  . rosuvastatin  20 mg Oral Daily  . sodium chloride  3 mL Intravenous Q12H   Continuous Infusions:  PRN Meds:.acetaminophen Assessment/Plan: Principal Problem:   Acute respiratory failure Active Problems:   Hypertension   Diabetes mellitus type 2 in obese   Smoker   Elevated troponin I level   Acute on chronic systolic CHF (congestive heart failure)   Dyspnea   Morbid obesity   Acute respiratory acidosis   Chronic pain   Prolonged Q-T interval on ECG   Acute encephalopathy   NSTEMI (non-ST elevated myocardial infarction)   Atrial flutter with rapid ventricular response  Paroxysmal atrial flutter/fibrillation with RVR: Patient with an episode of A. fib with RVR overnight. Patient did not feel any palpitations. -Xarelto given a CHA2DS2-VASc of 4. -Appreciate cardiology recommendations -Patient under consideration for genetic AF versus tikosyn therapy. However, QTc prolonged at 559. -Coreg increased to 18.75 mg twice a day -Ablation is  also a possibility though less likely to be successful given enlarged atrial size.  Acute hypoxic respiratory failure in the setting of systolic congestive heart failure: Patient still breathing well on room air. It seems that patient has not diuresed well over the last 24 hours with only a net -300 mL with a total of 700 mL out. Chest x-ray from yesterday showing bibasilar edema. No evidence of pneumonia. It is likely that patient's initial presentation was due to acute decompensated systolic congestive heart failure (history of depressed ejection fraction of 25-30%) in addition to some component of COPD exacerbation due to mild wheezing.  -Appreciate cardiology recommendations -Lasix increased to 40 mg 3 times a day -Trial Xopenex given issues with arrhythmia  Demand ischemia: Resolved. Troponins had down trended. Patient remains asymptomatic.  Acute encephalopathy:  Resolved. Patient initially presenting with sedation in the setting of hypoxia.   Chronic hip pain: There is  a high suspicion for drug-seeking behavior. Patient has been fired from 2 different pain clinics. He states that he is on methadone 10 mg twice a day and Percocet 10-325 mg every 6 hours as an outpatient. Patient says that he hasn't taken opiates for several days prior to admission. -Will avoid opiates during this admission. -Tylenol and Voltaren gel for hip pain  Uncontrolled type 2 diabetes:would glucose is in the high 100s to 200s over last 24 hours. Hemoglobin A1c of 9.8. Patient has had some resistance to starting insulin as an outpatient. -Start resistance sliding scale insulin -Restart home glipizide 20 mg twice a day   Hypertension: Patient with a decently controlled blood pressures in the last 24 hours. -Continue with lisinopril 20 mg daily. -continue with Coreg 12.5 mg twice a day. -Continue with Lasix 40 mg twice a day  DVT prophylaxis: Xarelto Diet: Heart healthy carb modified Code: Full    LOS: 2 days    Services Needed at time of discharge: Y = Yes, Blank = No PT:   OT:   RN:   Equipment:   Other:    Luan Moore, MD 02/21/2015, 11:23 AM

## 2015-02-21 NOTE — Progress Notes (Signed)
SUBJECTIVE: The patient did not sleep well last night.  He has had a non-productive cough and increased shortness of breath.  No awareness of palpitations.  Hx reviewed  Some edema  Some abd distension with PND and orthopneic ocugh  CURRENT MEDICATIONS: . carvedilol  12.5 mg Oral BID WC  . diclofenac sodium  2 g Topical QID  . furosemide  40 mg Intravenous BID  . guaiFENesin  1,200 mg Oral BID  . insulin aspart  0-5 Units Subcutaneous QHS  . insulin aspart  0-9 Units Subcutaneous TID WC  . ipratropium-albuterol  3 mL Nebulization TID  . lisinopril  20 mg Oral Daily  . rivaroxaban  20 mg Oral Q supper  . rosuvastatin  20 mg Oral Daily  . sodium chloride  3 mL Intravenous Q12H      OBJECTIVE: Physical Exam: Filed Vitals:   02/20/15 2233 02/21/15 0120 02/21/15 0540 02/21/15 0846  BP: 135/75 144/94 144/63   Pulse: 72 106 60   Temp: 98.2 F (36.8 C) 98.3 F (36.8 C) 98.7 F (37.1 C)   TempSrc: Oral Oral Oral   Resp: _0 Height:      Weight:      SpO2: 97% 93% 99% 91%    Intake/Output Summary (Last 24 hours) at 02/21/15 0958 Last data filed at 02/21/15 0931  Gross per 24 hour  Intake    720 ml  Output   1075 ml  Net   -355 ml    Telemetry reveals sinus rhythm with intermittent AF with RVR, ventricular rates up to 150's but mostly in the 110-20s with Afib  About 10-20% of time  GEN- The patient is well appearing, alert and oriented x 3 today.   Cough  Head- normocephalic, atraumatic Eyes-  Sclera clear, conjunctiva pink Ears- hearing intact Oropharynx- clear Neck- supple  JVP flat Lungs- clear  slightly increased work of breathing Heart- Regular rate and rhythm, no murmurs, rubs or gallops  GI- obese, NT, slightly distended, + BS Extremities- no clubbing, cyanosis, or edema, 2+DP/PT pulses Skin- no rash or lesion Psych- euthymic mood, full affect Neuro- strength and sensation are intact  LABS: Basic Metabolic Panel:  Recent Labs  02/20/15 1242  02/21/15 0734  NA 138 140  K 3.3* 4.0  CL 96 96  CO2 33* 34*  GLUCOSE 156* 160*  BUN 19 13  CREATININE 0.97 0.90  CALCIUM 9.0 9.4  MG  --  1.8   CBC:  Recent Labs  02/19/15 0600  WBC 7.6  NEUTROABS 3.7  HGB 12.7*  HCT 41.4  MCV 87.3  PLT 397   Cardiac Enzymes:  Recent Labs  02/19/15 1830 02/19/15 2350 02/20/15 1242  TROPONINI 0.26* 0.38* 0.30*   Hemoglobin A1C:  Recent Labs  02/19/15 1221  HGBA1C 9.8*    RADIOLOGY: Dg Chest 2 View 02/20/2015   CLINICAL DATA:  Respiratory distress and fever.  EXAM: CHEST  2 VIEW  COMPARISON:  February 19, 2015.  FINDINGS: Stable cardiomegaly. No pneumothorax or pleural effusion is noted. Stable mild central pulmonary vascular congestion is noted. Some degree of perihilar and basilar edema cannot be excluded. Ossification of anterior longitudinal ligament is noted in lower thoracic spine.  IMPRESSION: Stable cardiomegaly and central pulmonary vascular congestion is noted suggesting congestive heart failure. Some degree of bilateral perihilar and basilar edema may be present.   Electronically Signed   By: Marijo Conception, M.D.   On: 02/20/2015 16:27   ASSESSMENT  AND PLAN:  Principal Problem:   Acute respiratory failure Active Problems:   Hypertension   Diabetes mellitus type 2 in obese   Smoker   Elevated troponin I level   Acute on chronic systolic CHF (congestive heart failure)   Dyspnea   Morbid obesity   Acute respiratory acidosis   Chronic pain   Prolonged Q-T interval on ECG   Acute encephalopathy   NSTEMI (non-ST elevated myocardial infarction)   Atrial flutter with rapid ventricular response  1.  Atrial fibrillation/flutter The patient has both atrial fibrillation and typical atrial flutter.  He is appropriately anticoagulated with Xarelto for CHADS2VASC score of at least 4 (started 02/19/15).  He also has a cardiomyopathy diagnosed in December of 2015 that is potentially tachycardia mediated.   LA size is 49 and  the likelihood of maintaining SR long term is reduced.  He is not a candidate for Flecainide with cardiomyopathy. QTc remains prolonged after Levaquin washout - likely would not be a candidate for Tikosyn   Ablation is an option, but success rates are reduced due to LA size.  Research is screening patient for the Genetic AF study.  Will increase Coreg dose today for RVR  2.  Acute on chronic systolic heart failure I/O -300 yesterday, but uncertain of accuracy of I/O, no daily weights (ordered today)  agree Continue IV Lasix (will increase to tid today) Coreg increased to 18.42m bid today Continue Lisinopril  3.  HTN BP elevated Will increase Coreg as above  4.  Obesity Weight loss encouraged  AChanetta Marshall NP 02/21/2015 10:21 AM   Add aldactone for heart failure esp with hx of hypokalemia Will continue diuretics with cough and CXR although no JVP or edema Will wait till bun/cr starts to bump Will repeat echo Needs outpt sleep study-- daytime somnolence

## 2015-02-21 NOTE — Consult Note (Signed)
   [facility] CM Inpatient Consult   02/21/2015  Nicholas Caldwell July 09, 1946 [id number] Referral received. Patient evaluated for community based chronic disease management services with [place] [facility] as a benefit of patient's [employer]. Spoke with patient and his wife Nicholas Caldwell,  at bedside to explain [facility] services. Consent form signed and information folder about [place] was given.  Patient will receive post discharge transition of care call and will be evaluated for monthly home visits for assessments and disease process education.  Left contact information and [facility] literature at bedside. Made Inpatient Case Manager aware that [facility] following. Of note, [facility] Care Management services does not replace or interfere with any services that are arranged by inpatient case management or social work.  For additional questions or referrals please contact:   Natividad Brood, RN BSN [facility] Liaison  [phone] business mobile phone

## 2015-02-21 NOTE — Progress Notes (Signed)
Pt went into a fib rate of 90 then fluctuated in the 140"s but didn't sustain there . Pt then converted on his on back into sinus rhythm. Dr Trudee Kuster aware. Will continue to monitor.

## 2015-02-21 NOTE — Progress Notes (Signed)
  PROGRESS NOTE MEDICINE TEACHING ATTENDING   Day 2 of stay Patient name: Nicholas Caldwell   Medical record number: [id number] Date of birth: September 30, 1946   Met with patient this morning. Reports no sleep last night due to cough. No complaints other than that.  Blood pressure 147/77, pulse 61, temperature 98.7 F (37.1 C), temperature source Oral, resp. rate 16, height 6' (1.829 m), weight 264 lb 8 oz (119.976 kg), SpO2 91 %. The patient is alert and oriented, comfortable, in no acute distress. PERRL, EOMI. Heart exhibits regular rate and rhythm, no murmurs. Lungs - minimal wheeze/rhonchi. Bibasilar rales. Abdomen is soft and non-tender. There is no pedal edema and good pedal pulses. There are no gross focal neurological deficits apparent.    Atrial flutter/fibrillation - paroxysmal, RVR - On xarelto, cardiology consulting, EP reviewing case, appreciate input. Will  Await their final recommendations on genetic AF versus tikosyn.  Acute hypoxic/hypercarbic respiratory failure - without oxygen breathing well, saturating >90%. Continue duonebs.  Acute CHF - contraction alkalosis, change lasix to PO.   DM2 - Agree with introducing home regimen gradually.  Hypertension - On lisopril, coreg and lasix. Continue to monitor.  Rest per Dr Trudee Kuster note. I have discussed the care of this patient with my IM team residents. Please see the resident note for details.  [place], [place] 02/21/2015, 1:16 PM.

## 2015-02-22 DIAGNOSIS — I5021 Acute systolic (congestive) heart failure: Secondary | ICD-10-CM

## 2015-02-22 DIAGNOSIS — G4710 Hypersomnia, unspecified: Secondary | ICD-10-CM

## 2015-02-22 DIAGNOSIS — I480 Paroxysmal atrial fibrillation: Secondary | ICD-10-CM | POA: Insufficient documentation

## 2015-02-22 DIAGNOSIS — G8929 Other chronic pain: Secondary | ICD-10-CM

## 2015-02-22 DIAGNOSIS — I509 Heart failure, unspecified: Secondary | ICD-10-CM

## 2015-02-22 DIAGNOSIS — M5136 Other intervertebral disc degeneration, lumbar region: Secondary | ICD-10-CM

## 2015-02-22 DIAGNOSIS — I248 Other forms of acute ischemic heart disease: Secondary | ICD-10-CM

## 2015-02-22 LAB — GLUCOSE, CAPILLARY
GLUCOSE-CAPILLARY: 154 mg/dL — AB (ref 70–99)
GLUCOSE-CAPILLARY: 210 mg/dL — AB (ref 70–99)
Glucose-Capillary: 224 mg/dL — ABNORMAL HIGH (ref 70–99)
Glucose-Capillary: 185 mg/dL — ABNORMAL HIGH (ref 70–99)

## 2015-02-22 LAB — TSH: TSH: 1.391 u[IU]/mL (ref 0.350–4.500)

## 2015-02-22 LAB — BASIC METABOLIC PANEL
Anion gap: 12 (ref 5–15)
BUN: 16 mg/dL (ref 6–23)
CALCIUM: 9 mg/dL (ref 8.4–10.5)
CO2: 31 mmol/L (ref 19–32)
Chloride: 97 mmol/L (ref 96–112)
Creatinine, Ser: 0.93 mg/dL (ref 0.50–1.35)
GFR calc Af Amer: >90 mL/min (ref >90)
GFR calc non Af Amer: 86 mL/min — ABNORMAL LOW (ref >90)
Glucose, Bld: 156 mg/dL — ABNORMAL HIGH (ref 70–99)
POTASSIUM: 3.5 mmol/L (ref 3.5–5.1)
Sodium: 140 mmol/L (ref 135–145)

## 2015-02-22 MED ORDER — AMIODARONE HCL 200 MG PO TABS
200.0000 mg | ORAL_TABLET | Freq: Two times a day (BID) | ORAL | Status: DC
  Administered 2015-02-22 – 2015-02-23 (×3): 200 mg via ORAL
  Filled 2015-02-22 (×4): qty 1

## 2015-02-22 MED ORDER — FUROSEMIDE 40 MG PO TABS
40.0000 mg | ORAL_TABLET | Freq: Two times a day (BID) | ORAL | Status: DC
  Administered 2015-02-22 – 2015-02-23 (×2): 40 mg via ORAL
  Filled 2015-02-22 (×4): qty 1

## 2015-02-22 MED ORDER — OFF THE BEAT BOOK
Freq: Once | Status: AC
  Administered 2015-02-22: 21:00:00
  Filled 2015-02-22: qty 1

## 2015-02-22 MED ORDER — TRAMADOL HCL 50 MG PO TABS: 50.0000 mg | ORAL_TABLET | Freq: Once | ORAL | Status: DC

## 2015-02-22 NOTE — Progress Notes (Signed)
Subjective:  Patient states that he is doing well this morning. He states that he is breathing well. He denies any chest pain or palpitations. Patient states that he is willing to stay in the hospital as long as he needs in order to diurese properly.   Objective: Vital signs in last 24 hours: Filed Vitals:   02/21/15 1453 02/21/15 2121 02/22/15 0555 02/22/15 0845  BP: 138/90 144/82 141/61 151/76  Pulse: 54 56 63 70  Temp: 97.5 F (36.4 C) 98.4 F (36.9 C) 98.5 F (36.9 C)   TempSrc: Oral Oral Oral   Resp: _0 Height:      Weight:   257 lb (116.574 kg)   SpO2: 97% 100% 91%    Weight change:   Intake/Output Summary (Last 24 hours) at 02/22/15 1138 Last data filed at 02/22/15 0951  Gross per 24 hour  Intake    600 ml  Output    225 ml  Net    375 ml   General: resting in bed, sitting upright, in no acute distress HEENT: PERRL, EOMI, no scleral icterus Cardiac: RRR, no rubs, murmurs or gallops Pulm: There to auscultation bilaterally, Abd: soft, nontender, nondistended, BS present Ext: warm and well perfused, no pedal edema Neuro: alert and oriented X3, cranial nerves II-XII grossly intact Skin: no rashes or lesions noted Psych: appropriate affect  Lab Results: Basic Metabolic Panel:  Recent Labs Lab 02/21/15 0734 02/22/15 0741  NA 140 140  K 4.0 3.5  CL 96 97  CO2 34* 31  GLUCOSE 160* 156*  BUN 13 16  CREATININE 0.90 0.93  CALCIUM 9.4 9.0  MG 1.8  --    Liver Function Tests: No results for input(s): AST, ALT, ALKPHOS, BILITOT, PROT, ALBUMIN in the last 168 hours. No results for input(s): LIPASE, AMYLASE in the last 168 hours. No results for input(s): AMMONIA in the last 168 hours. CBC:  Recent Labs Lab 02/19/15 0600  WBC 7.6  NEUTROABS 3.7  HGB 12.7*  HCT 41.4  MCV 87.3  PLT 397   Cardiac Enzymes:  Recent Labs Lab 02/19/15 1830 02/19/15 2350 02/20/15 1242  TROPONINI 0.26* 0.38* 0.30*   BNP: No results for input(s): PROBNP in the  last 168 hours. D-Dimer: No results for input(s): DDIMER in the last 168 hours. CBG:  Recent Labs Lab 02/20/15 2230 02/21/15 0808 02/21/15 1155 02/21/15 1726 02/21/15 2129 02/22/15 0813  GLUCAP 217* 151* 179* 169* 152* 154*   Hemoglobin A1C:  Recent Labs Lab 02/19/15 1221  HGBA1C 9.8*   Fasting Lipid Panel: No results for input(s): CHOL, HDL, LDLCALC, TRIG, CHOLHDL, LDLDIRECT in the last 168 hours. Thyroid Function Tests: No results for input(s): TSH, T4TOTAL, FREET4, T3FREE, THYROIDAB in the last 168 hours. Coagulation: No results for input(s): LABPROT, INR in the last 168 hours. Anemia Panel: No results for input(s): VITAMINB12, FOLATE, FERRITIN, TIBC, IRON, RETICCTPCT in the last 168 hours. Urine Drug Screen: Drugs of Abuse     Component Value Date/Time   LABOPIA NONE DETECTED 02/19/2015 1010   COCAINSCRNUR NONE DETECTED 02/19/2015 1010   LABBENZ POSITIVE* 02/19/2015 1010   AMPHETMU NONE DETECTED 02/19/2015 1010   THCU NONE DETECTED 02/19/2015 1010   LABBARB NONE DETECTED 02/19/2015 1010    Alcohol Level: No results for input(s): ETH in the last 168 hours. Urinalysis: No results for input(s): COLORURINE, LABSPEC, PHURINE, GLUCOSEU, HGBUR, BILIRUBINUR, KETONESUR, PROTEINUR, UROBILINOGEN, NITRITE, LEUKOCYTESUR in the last 168 hours.  Invalid input(s): APPERANCEUR  Micro Results:  Recent Results (from the past 240 hour(s))  MRSA PCR Screening     Status: None   Collection Time: 02/19/15  3:40 AM  Result Value Ref Range Status   MRSA by PCR NEGATIVE NEGATIVE Final    Comment:        The GeneXpert MRSA Assay (FDA approved for NASAL specimens only), is one component of a comprehensive MRSA colonization surveillance program. It is not intended to diagnose MRSA infection nor to guide or monitor treatment for MRSA infections.    Studies/Results: Dg Chest 2 View  02/20/2015   CLINICAL DATA:  Respiratory distress and fever.  EXAM: CHEST  2 VIEW  COMPARISON:   February 19, 2015.  FINDINGS: Stable cardiomegaly. No pneumothorax or pleural effusion is noted. Stable mild central pulmonary vascular congestion is noted. Some degree of perihilar and basilar edema cannot be excluded. Ossification of anterior longitudinal ligament is noted in lower thoracic spine.  IMPRESSION: Stable cardiomegaly and central pulmonary vascular congestion is noted suggesting congestive heart failure. Some degree of bilateral perihilar and basilar edema may be present.   Electronically Signed   By: Marijo Conception, M.D.   On: 02/20/2015 16:27   Medications: I have reviewed the patient's current medications. Scheduled Meds: . amiodarone  200 mg Oral BID  . carvedilol  18.75 mg Oral BID WC  . diclofenac sodium  2 g Topical QID  . furosemide  40 mg Intravenous TID  . glipiZIDE  20 mg Oral BID AC  . guaiFENesin  1,200 mg Oral BID  . insulin aspart  0-20 Units Subcutaneous TID WC  . insulin aspart  0-5 Units Subcutaneous QHS  . lisinopril  20 mg Oral Daily  . rivaroxaban  20 mg Oral Q supper  . rosuvastatin  20 mg Oral Daily  . sodium chloride  3 mL Intravenous Q12H  . spironolactone  12.5 mg Oral Daily   Continuous Infusions:  PRN Meds:.acetaminophen, levalbuterol Assessment/Plan: Principal Problem:   Acute respiratory failure Active Problems:   Hypertension   Diabetes mellitus type 2 in obese   Smoker   Elevated troponin I level   Acute on chronic systolic CHF (congestive heart failure)   Dyspnea   Morbid obesity   Acute respiratory acidosis   Chronic pain   Prolonged Q-T interval on ECG   Acute encephalopathy   NSTEMI (non-ST elevated myocardial infarction)   Atrial flutter with rapid ventricular response  Paroxysmal atrial flutter/fibrillation with RVR: Patient has remained in normal sinus rhythm with no episodes of tachycardia over the last 24 hours.  -Xarelto given a CHA2DS2-VASc of 4. -Appreciate cardiology recommendations -Patient is a poor candidate for  genetic AF and tikosyn therapy. -Coreg 18.75 mg twice a day -Amiodarone 200 mg twice a day for 1 week and daily afterwards. -Patient will need annual eye exams -TSH pending -Ablation is also a possibility though less likely to be successful given enlarged atrial size.  Acute hypoxic respiratory failure in the setting of systolic congestive heart failure: Patient still breathing well on room air. Patient is net -300 mL over the last 24 hours with 1000 mL out. Patient has had around his normal dry weight according to the record. It is likely that patient's initial presentation was due to acute decompensated systolic congestive heart failure (history of depressed ejection fraction of 25-30%) in addition to some component of COPD exacerbation due to mild wheezing.  -Appreciate cardiology recommendations -Lasix increased to 40 mg 3 times a day -Xopenex given issues with arrhythmia -  Echocardiogram pending  Daytime sleepiness: Patient will need a outpatient sleep study upon discharge.  Demand ischemia: Resolved. Troponins had down trended. Patient remains asymptomatic.  Acute encephalopathy:  Resolved. Patient initially presenting with sedation in the setting of hypoxia.   Chronic hip pain: There is a high suspicion for drug-seeking behavior. Patient has been fired from 2 different pain clinics. He states that he is on methadone 10 mg twice a day and Percocet 10-325 mg every 6 hours as an outpatient. Patient says that he hasn't taken opiates for several days prior to admission. -Will avoid opiates during this admission. -Tylenol and Voltaren gel for hip pain  Uncontrolled type 2 diabetes: Glucose is in the high 100s over last 24 hours. Hemoglobin A1c of 9.8. Patient has had some resistance to starting insulin as an outpatient. -Resistance sliding scale insulin -Home glipizide 20 mg twice a day   Hypertension: Patient with a decently controlled blood pressures in the last 24 hours. -Continue with  lisinopril 20 mg daily. -continue with Coreg 12.5 mg twice a day. -Continue with Lasix 40 mg twice a day  Prolonged QTC: QTC reduced to 509 from 559 yesterday. This makes the patient a poor candidate for tikosyn.   DVT prophylaxis: Xarelto Diet: Heart healthy carb modified Code: Full    LOS: 3 days   Services Needed at time of discharge: Y = Yes, Blank = No PT:   OT:   RN:   Equipment:   Other:    Luan Moore, MD 02/22/2015, 11:38 AM

## 2015-02-22 NOTE — Progress Notes (Addendum)
SUBJECTIVE: Shortness of breath slightly improved after diuresis yesterday  CURRENT MEDICATIONS: . carvedilol  18.75 mg Oral BID WC  . diclofenac sodium  2 g Topical QID  . furosemide  40 mg Intravenous TID  . glipiZIDE  20 mg Oral BID AC  . guaiFENesin  1,200 mg Oral BID  . insulin aspart  0-20 Units Subcutaneous TID WC  . insulin aspart  0-5 Units Subcutaneous QHS  . lisinopril  20 mg Oral Daily  . rivaroxaban  20 mg Oral Q supper  . rosuvastatin  20 mg Oral Daily  . sodium chloride  3 mL Intravenous Q12H  . spironolactone  12.5 mg Oral Daily      OBJECTIVE: Physical Exam: Filed Vitals:   02/21/15 1202 02/21/15 1453 02/21/15 2121 02/22/15 0555  BP: 147/77 138/90 144/82 141/61  Pulse: 61 54 56 63  Temp:  97.5 F (36.4 C) 98.4 F (36.9 C) 98.5 F (36.9 C)  TempSrc:  Oral Oral Oral  Resp:  _0 Height:      Weight:    257 lb (116.574 kg)  SpO2:  97% 100% 91%    Intake/Output Summary (Last 24 hours) at 02/22/15 0716 Last data filed at 02/22/15 0559  Gross per 24 hour  Intake    720 ml  Output   1000 ml  Net   -280 ml    Telemetry reveals sinus rhythm with intermittent AF with RVR, ventricular rates up to 150's    GEN- The patient is well appearing, alert and oriented x 3 today.   Cough  Head- normocephalic, atraumatic Eyes-  Sclera clear, conjunctiva pink Ears- hearing intact Oropharynx- clear Neck- supple  Lungs- clear to auscultation Heart- Regular rate and rhythm, no murmurs, rubs or gallops  GI- obese, NT, slightly distended, + BS Extremities- no clubbing, cyanosis, or edema, 2+DP/PT pulses Skin- no rash or lesion Psych- euthymic mood, full affect Neuro- strength and sensation are intact  LABS: Basic Metabolic Panel:  Recent Labs  02/20/15 1242 02/21/15 0734  NA 138 140  K 3.3* 4.0  CL 96 96  CO2 33* 34*  GLUCOSE 156* 160*  BUN 19 13  CREATININE 0.97 0.90  CALCIUM 9.0 9.4  MG  --  1.8   Cardiac Enzymes:  Recent Labs  02/19/15 1830 02/19/15 2350 02/20/15 1242  TROPONINI 0.26* 0.38* 0.30*   Hemoglobin A1C:  Recent Labs  02/19/15 1221  HGBA1C 9.8*    RADIOLOGY: Dg Chest 2 View 02/20/2015   CLINICAL DATA:  Respiratory distress and fever.  EXAM: CHEST  2 VIEW  COMPARISON:  February 19, 2015.  FINDINGS: Stable cardiomegaly. No pneumothorax or pleural effusion is noted. Stable mild central pulmonary vascular congestion is noted. Some degree of perihilar and basilar edema cannot be excluded. Ossification of anterior longitudinal ligament is noted in lower thoracic spine.  IMPRESSION: Stable cardiomegaly and central pulmonary vascular congestion is noted suggesting congestive heart failure. Some degree of bilateral perihilar and basilar edema may be present.   Electronically Signed   By: Marijo Conception, M.D.   On: 02/20/2015 16:27   ASSESSMENT AND PLAN:  Principal Problem:   Acute respiratory failure Active Problems:   Hypertension   Diabetes mellitus type 2 in obese   Smoker   Elevated troponin I level   Acute on chronic systolic CHF (congestive heart failure)   Dyspnea   Morbid obesity   Acute respiratory acidosis   Chronic pain   Prolonged Q-T interval on  ECG   Acute encephalopathy   NSTEMI (non-ST elevated myocardial infarction)   Atrial flutter with rapid ventricular response  1.  Atrial fibrillation/flutter The patient has both atrial fibrillation and typical atrial flutter.  He is appropriately anticoagulated with Xarelto for CHADS2VASC score of at least 4 (started 02/19/15).  He also has a cardiomyopathy diagnosed in December of 2015 that is potentially tachycardia mediated.   LA size is 49 and the likelihood of maintaining SR long term is reduced.  He is not a candidate for Flecainide with cardiomyopathy. QTc remains prolonged after Levaquin washout - likely would not be a candidate for Tikosyn.  Felt to not to be a candidate for Genetic AF Ablation is an option, but success rates are  reduced due to LA size.  Coreg increased yesterday  Start amiodarone today to maintain SR - 266m twice daily for 1 week then 2018mdaily. Will need annual eye exams, baseline PFT's.  LFT's normal this admission, will check TSH today  2.  Acute on chronic systolic heart failure Continue IV Lasix (BMET pending this morning) Continue Lisinopril, Coreg  3.  HTN Stable No change required today  4.  Obesity Weight loss encouraged  AmChanetta MarshallNP 02/22/2015 7:16 AM  I have seen, examined the patient, and reviewed the above assessment and plan.  Changes to above are made where necessary. Unfortunately, he is not a candidate for GENETIC AF trial.  Very few AAD options given prolonged QT.  Will therefore start amiodarone for rhythm control and follow closely in the AF clinic.   Co Sign: JaThompson GrayerMD 02/22/2015 10:30 PM

## 2015-02-22 NOTE — Progress Notes (Signed)
  Echocardiogram 2D Echocardiogram has been performed.  Nicholas Caldwell 02/22/2015, 9:19 AM

## 2015-02-22 NOTE — Progress Notes (Signed)
Nambe for xarelto Indication: atrial fibrillation  No Known Allergies  Patient Measurements: Height: 6' (182.9 cm) Weight: 257 lb (116.574 kg) IBW/kg (Calculated) : 77.6   Vital Signs: Temp: 98.5 F (36.9 C) (04/21 0555) Temp Source: Oral (04/21 0555) BP: 151/76 mmHg (04/21 0845) Pulse Rate: 70 (04/21 0845)  Labs:  Recent Labs  02/19/15 1830  02/19/15 2350 02/20/15 1242 02/21/15 0734 02/22/15 0741  CREATININE  --   < > 1.05 0.97 0.90 0.93  TROPONINI 0.26*  --  0.38* 0.30*  --   --   < > = values in this interval not displayed.  Estimated Creatinine Clearance: 103 mL/min (by C-G formula based on Cr of 0.93).  Assessment: 69 yo M with new onset Afib with RVR. Started on Xarelto for anticoagulation on 4/18. Starting amio today, continues on Coreg.  Wt ~ 120 kg; SCr 0.69 with est Creat cl > 161m/min.  CBC stable, No bleeding reported.   Goal of Therapy:  Stroke prevention   Plan:  Xarelto 20 mg daily with supper CBC q72h Pharmacy will sign off, but continue to follow peripherally-Will educate patient prior to discharge  Eliyas Suddreth D. Crimson Beer, PharmD, BCPS Clinical Pharmacist Pager: [phone]/21/2016 11:24 AM

## 2015-02-22 NOTE — Research (Signed)
Per sponsor, pt does not meet Inclusion/Exclusion criteria for Genetic AF Research Study.

## 2015-02-22 NOTE — Progress Notes (Addendum)
  PROGRESS NOTE MEDICINE TEACHING ATTENDING   Day 3 of stay Patient name: Nicholas Caldwell   Medical record number: [id number] Date of birth: [birth year]   Complains of back pain.  Blood pressure 132/81, pulse 68, temperature 99.4 F (37.4 C), temperature source Oral, resp. rate 24, height 6' (1.829 m), weight 257 lb (116.574 kg), SpO2 95 %. The patient is alert and oriented, comfortable, in no acute distress. PERRL, EOMI. Heart exhibits regular rate and rhythm, no murmurs. Lungs are clear to auscultation. Abdomen is soft and non-tender. There is no pedal edema and good pedal pulses. There are no gross focal neurological deficits apparent.    A/P  For atrial flutter/fibrillation started on amiodarone, qtc decreased today.   Breathing status stays improved.   Acute CHF - clinically improved, changed to oral lasix today.   Back pain secodary to degenerative disc disease - the patient takes oxycodone and methadone at home however because of prolonged qtc, we have held them. We can possibly try him tramadol or muscle relaxants.   I have discussed the care of this patient with my IM team residents. Please see the resident note for details.  Madilyn Fireman 02/22/2015, 4:15 PM.

## 2015-02-23 ENCOUNTER — Inpatient Hospital Stay (HOSPITAL_COMMUNITY): Payer: Medicare Other

## 2015-02-23 ENCOUNTER — Other Ambulatory Visit: Payer: Self-pay

## 2015-02-23 LAB — BASIC METABOLIC PANEL
Anion gap: 12 (ref 5–15)
BUN: 16 mg/dL (ref 6–23)
CALCIUM: 8.7 mg/dL (ref 8.4–10.5)
CO2: 29 mmol/L (ref 19–32)
Chloride: 97 mmol/L (ref 96–112)
Creatinine, Ser: 0.97 mg/dL (ref 0.50–1.35)
GFR, EST NON AFRICAN AMERICAN: 84 mL/min — AB (ref >90)
GLUCOSE: 218 mg/dL — AB (ref 70–99)
Potassium: 3.4 mmol/L — ABNORMAL LOW (ref 3.5–5.1)
Sodium: 138 mmol/L (ref 135–145)

## 2015-02-23 LAB — CBC
HEMATOCRIT: 39.6 % (ref 39.0–52.0)
Hemoglobin: 12.7 g/dL — ABNORMAL LOW (ref 13.0–17.0)
MCH: 26.8 pg (ref 26.0–34.0)
MCHC: 32.1 g/dL (ref 30.0–36.0)
MCV: 83.5 fL (ref 78.0–100.0)
Platelets: 374 10*3/uL (ref 150–400)
RBC: 4.74 MIL/uL (ref 4.22–5.81)
RDW: 14.9 % (ref 11.5–15.5)
WBC: 8.6 10*3/uL (ref 4.0–10.5)

## 2015-02-23 LAB — GLUCOSE, CAPILLARY
Glucose-Capillary: 175 mg/dL — ABNORMAL HIGH (ref 70–99)
Glucose-Capillary: 205 mg/dL — ABNORMAL HIGH (ref 70–99)

## 2015-02-23 LAB — MAGNESIUM: MAGNESIUM: 1.9 mg/dL (ref 1.5–2.5)

## 2015-02-23 MED ORDER — FUROSEMIDE 40 MG PO TABS: 40.0000 mg | ORAL_TABLET | Freq: Two times a day (BID) | ORAL | Status: DC

## 2015-02-23 MED ORDER — IPRATROPIUM-ALBUTEROL 0.5-2.5 (3) MG/3ML IN SOLN
3.0000 mL | Freq: Once | RESPIRATORY_TRACT | Status: AC
  Administered 2015-02-23: 3 mL via RESPIRATORY_TRACT
  Filled 2015-02-23: qty 3

## 2015-02-23 MED ORDER — POTASSIUM CHLORIDE CRYS ER 20 MEQ PO TBCR
40.0000 meq | EXTENDED_RELEASE_TABLET | Freq: Once | ORAL | Status: AC
  Administered 2015-02-23: 40 meq via ORAL
  Filled 2015-02-23: qty 2

## 2015-02-23 MED ORDER — LISINOPRIL 30 MG PO TABS: 30.0000 mg | ORAL_TABLET | Freq: Every day | ORAL | Status: DC

## 2015-02-23 MED ORDER — LEVALBUTEROL HCL 0.63 MG/3ML IN NEBU: 0.6300 mg | INHALATION_SOLUTION | Freq: Four times a day (QID) | RESPIRATORY_TRACT | Status: DC

## 2015-02-23 MED ORDER — SPIRONOLACTONE 25 MG PO TABS: 12.5000 mg | ORAL_TABLET | Freq: Every day | ORAL | Status: DC

## 2015-02-23 MED ORDER — DICLOFENAC SODIUM 1 % TD GEL: 2.0000 g | Freq: Four times a day (QID) | TRANSDERMAL | Status: DC

## 2015-02-23 MED ORDER — LEVALBUTEROL HCL 0.63 MG/3ML IN NEBU
0.6300 mg | INHALATION_SOLUTION | RESPIRATORY_TRACT | Status: DC | PRN
Start: 2015-02-23 — End: 2015-02-23

## 2015-02-23 MED ORDER — LEVALBUTEROL HCL 0.63 MG/3ML IN NEBU
0.6300 mg | INHALATION_SOLUTION | Freq: Four times a day (QID) | RESPIRATORY_TRACT | Status: DC
  Administered 2015-02-23: 0.63 mg via RESPIRATORY_TRACT
  Filled 2015-02-23: qty 3

## 2015-02-23 MED ORDER — CARVEDILOL 6.25 MG PO TABS: 18.7500 mg | ORAL_TABLET | Freq: Two times a day (BID) | ORAL | Status: DC

## 2015-02-23 MED ORDER — RIVAROXABAN 20 MG PO TABS: 20.0000 mg | ORAL_TABLET | Freq: Every day | ORAL | Status: DC

## 2015-02-23 MED ORDER — AMIODARONE HCL 200 MG PO TABS: ORAL_TABLET | ORAL | Status: DC

## 2015-02-23 NOTE — Progress Notes (Signed)
Subjective:  Patient perseverated on his pain medication regimen as an outpatient. He expressed some disappointment that we had spoken to his outpatient provider regarding his pain medication regimen. The provider had indicated to [place] that patient was at high risk for narcotic abuse and has been recently fired from their clinic. He also has a history of being fired from other pain clinics. Otherwise, patient not reporting any complaints.  Objective: Vital signs in last 24 hours: Filed Vitals:   02/22/15 0845 02/22/15 1419 02/22/15 1637 02/22/15 2129  BP: 151/76 132/81 157/69 128/73  Pulse: 70 68 63 61  Temp:  99.4 F (37.4 C)  98.3 F (36.8 C)  TempSrc:  Oral  Oral  Resp:  24  20  Height:      Weight:      SpO2:  95%  97%   Weight change:   Intake/Output Summary (Last 24 hours) at 02/23/15 0656 Last data filed at 02/22/15 1512  Gross per 24 hour  Intake    600 ml  Output      0 ml  Net    600 ml   General: resting in bed, sitting upright, in no acute distress HEENT: PERRL, EOMI, no scleral icterus Cardiac: RRR, no rubs, murmurs or gallops Pulm: Clear to auscultation bilaterally Abd: soft, nontender, nondistended, BS present Ext: warm and well perfused, no pedal edema Neuro: alert and oriented X3, cranial nerves II-XII grossly intact Skin: no rashes or lesions noted Psych: appropriate affect  Lab Results: Basic Metabolic Panel:  Recent Labs Lab 02/21/15 0734 02/22/15 0741 02/23/15 0541  NA 140 140 138  K 4.0 3.5 3.4*  CL 96 97 97  CO2 34* 31 29  GLUCOSE 160* 156* 218*  BUN _0 CREATININE 0.90 0.93 0.97  CALCIUM 9.4 9.0 8.7  MG 1.8  --   --    Liver Function Tests: No results for input(s): AST, ALT, ALKPHOS, BILITOT, PROT, ALBUMIN in the last 168 hours. No results for input(s): LIPASE, AMYLASE in the last 168 hours. No results for input(s): AMMONIA in the last 168 hours. CBC:  Recent Labs Lab 02/19/15 0600 02/23/15 0541  WBC 7.6 8.6  NEUTROABS  3.7  --   HGB 12.7* 12.7*  HCT 41.4 39.6  MCV 87.3 83.5  PLT 397 374   Cardiac Enzymes:  Recent Labs Lab 02/19/15 1830 02/19/15 2350 02/20/15 1242  TROPONINI 0.26* 0.38* 0.30*   BNP: No results for input(s): PROBNP in the last 168 hours. D-Dimer: No results for input(s): DDIMER in the last 168 hours. CBG:  Recent Labs Lab 02/21/15 1726 02/21/15 2129 02/22/15 0813 02/22/15 1212 02/22/15 1639 02/22/15 2133  GLUCAP 169* 152* 154* 224* 185* 210*   Hemoglobin A1C:  Recent Labs Lab 02/19/15 1221  HGBA1C 9.8*   Fasting Lipid Panel: No results for input(s): CHOL, HDL, LDLCALC, TRIG, CHOLHDL, LDLDIRECT in the last 168 hours. Thyroid Function Tests:  Recent Labs Lab 02/22/15 1029  TSH 1.391   Coagulation: No results for input(s): LABPROT, INR in the last 168 hours. Anemia Panel: No results for input(s): VITAMINB12, FOLATE, FERRITIN, TIBC, IRON, RETICCTPCT in the last 168 hours. Urine Drug Screen: Drugs of Abuse     Component Value Date/Time   LABOPIA NONE DETECTED 02/19/2015 1010   COCAINSCRNUR NONE DETECTED 02/19/2015 1010   LABBENZ POSITIVE* 02/19/2015 1010   AMPHETMU NONE DETECTED 02/19/2015 1010   THCU NONE DETECTED 02/19/2015 1010   LABBARB NONE DETECTED 02/19/2015 1010    Alcohol  Level: No results for input(s): ETH in the last 168 hours. Urinalysis: No results for input(s): COLORURINE, LABSPEC, PHURINE, GLUCOSEU, HGBUR, BILIRUBINUR, KETONESUR, PROTEINUR, UROBILINOGEN, NITRITE, LEUKOCYTESUR in the last 168 hours.  Invalid input(s): APPERANCEUR  Micro Results: Recent Results (from the past 240 hour(s))  MRSA PCR Screening     Status: None   Collection Time: 02/19/15  3:40 AM  Result Value Ref Range Status   MRSA by PCR NEGATIVE NEGATIVE Final    Comment:        The GeneXpert MRSA Assay (FDA approved for NASAL specimens only), is one component of a comprehensive MRSA colonization surveillance program. It is not intended to diagnose  MRSA infection nor to guide or monitor treatment for MRSA infections.    Studies/Results: No results found. Medications: I have reviewed the patient's current medications. Scheduled Meds: . amiodarone  200 mg Oral BID  . carvedilol  18.75 mg Oral BID WC  . diclofenac sodium  2 g Topical QID  . furosemide  40 mg Oral BID  . glipiZIDE  20 mg Oral BID AC  . guaiFENesin  1,200 mg Oral BID  . insulin aspart  0-20 Units Subcutaneous TID WC  . insulin aspart  0-5 Units Subcutaneous QHS  . lisinopril  20 mg Oral Daily  . rivaroxaban  20 mg Oral Q supper  . rosuvastatin  20 mg Oral Daily  . sodium chloride  3 mL Intravenous Q12H  . spironolactone  12.5 mg Oral Daily  . traMADol  50 mg Oral Once   Continuous Infusions:  PRN Meds:.acetaminophen, levalbuterol Assessment/Plan: Principal Problem:   Acute respiratory failure Active Problems:   Hypertension   Diabetes mellitus type 2 in obese   Smoker   Elevated troponin I level   Acute on chronic systolic CHF (congestive heart failure)   Dyspnea   Morbid obesity   Acute respiratory acidosis   Chronic pain   Prolonged Q-T interval on ECG   Acute encephalopathy   NSTEMI (non-ST elevated myocardial infarction)   Atrial flutter with rapid ventricular response   Paroxysmal atrial fibrillation  Paroxysmal atrial flutter/fibrillation with RVR: Patient in normal sinus rhythm. Patient is a poor candidate for genetic AF and tikosyn therapy (prolonged QTc). TSH wnl.  -Xarelto given a CHA2DS2-VASc of 4. -Appreciate cardiology recommendations -Coreg 18.75 mg twice a day -Amiodarone 200 mg twice a day for 1 week and daily afterwards (started 02/22/15). -Patient will need annual eye exams -Ablation is also a possibility though less likely to be successful given enlarged atrial size.  Acute hypoxic respiratory failure in the setting of systolic congestive heart failure: Patient still breathing well on room air. Unreliable output tracking over  the last 24 hours. Patient is down 5 pounds. However, patient has been voiding in the toilet. Patient has had around his normal dry weight according to the record. It is likely that patient's initial presentation was due to acute decompensated systolic congestive heart failure (history of depressed ejection fraction of 25-30%) in addition to some component of COPD exacerbation due to mild wheezing. Repeat echocardiogram with a depressed ejection fraction of 25-30%, stable from last study. -Appreciate cardiology recommendations -Lasix 40 mg twice a day -Xopenex given issues with arrhythmia   Presumed COPD: Patient with some desaturations while ambulating. Patient intermittently has wheezes on physical exam. Chest x-ray unremarkable for any new pathologies. Patient does not seem volume overloaded. -PFTs as an outpatient -Continue inhalers as above -Discharged home with oxygen.  Chronic hip pain: There is a  high suspicion for drug-seeking behavior. Patient has been fired from 2 different pain clinics. He states that he is on methadone 10 mg twice a day and Percocet 10-325 mg every 6 hours as an outpatient. Patient says that he hasn't taken opiates for several days prior to admission. -Will avoid opiates during this admission. -Tylenol and Voltaren gel for hip pain  Uncontrolled type 2 diabetes: Glucose is in the high 150-210 over last 24 hours. Hemoglobin A1c of 9.8. Patient has had some resistance to starting insulin as an outpatient. -Resistant sliding scale insulin -Home glipizide 20 mg twice a day   Hypertension: Patient with moderately controlled blood pressures in the last 24 hours. -Continue with lisinopril 20 mg daily. -continue with Coreg 12.5 mg twice a day. -Continue with Lasix 40 mg twice a day  Prolonged QTC: QTc at 530 today. This makes the patient a poor candidate for tikosyn.   DVT prophylaxis: Xarelto Diet: Heart healthy carb modified Code: Full  Resolved:  -Demand  ischemia: Resolved. Troponins had down trended. Patient remains asymptomatic. -Acute encephalopathy:  Resolved. Patient initially presenting with sedation in the setting of hypoxia.   Dispo: Anticipated discharge in 0-1 days.  -Annual eye exams -Change amiodarone to once daily on 03/01/15.  -Needs follow up with AF clinic.  -Follow with [facility] for further pain management. -Outpatient sleep study for daytime sleepiness.     LOS: 4 days   Services Needed at time of discharge: Y = Yes, Blank = No PT:   OT:   RN:   Equipment:   Other:    Luan Moore, MD 02/23/2015, 6:56 AM

## 2015-02-23 NOTE — Progress Notes (Signed)
Patient was discharged home by MD order; discharged instructions  review and give to patient with care notes; IV DIC; skin intact;patient is going home with oxygen PRN; patient will be escorted to the car by nurse tech via wheelchair.

## 2015-02-23 NOTE — Progress Notes (Signed)
SATURATION QUALIFICATIONS: (This note is used to comply with regulatory documentation for home oxygen)  Patient Saturations on Room Air at Rest = 98%  Patient Saturations on Room Air while Ambulating = 86%  Patient Saturations on 2 Liters of oxygen while Ambulating = 95%  Please briefly explain why patient needs home oxygen: On room air, satO2 is dropping at 86%, patient having SOB.

## 2015-02-23 NOTE — Progress Notes (Signed)
  PROGRESS NOTE MEDICINE TEACHING ATTENDING   Day 4 of stay Patient name: Nicholas Caldwell   Medical record number: [id number] Date of birth: [birth year]   Complains of exertional dyspnea.  Blood pressure 174/88, pulse 64, temperature 98 F (36.7 C), temperature source Oral, resp. rate 20, height 6' (1.829 m), weight 258 lb (117.028 kg), SpO2 91 %. The patient is alert and oriented, comfortable, in no acute distress. PERRL, EOMI. Heart exhibits regular rate and rhythm, no murmurs. Lungs are clear to auscultation. Abdomen is soft and non-tender. There is no pedal edema and good pedal pulses. There are no gross focal neurological deficits apparent.   Assessment/Plan  Exertional dyspnea - The patient does not look volume overloaded. Given his initial presentation, he could have COPD at baseline for which he has never been evaluated in the past, he is a smoker. His ambulatory sats dropped to 86%, with 2L they remained 95%. We have never done his ambulatory saturations in the past either.  We will repeat a CXR today to reassess any further pathology. Continue nebs, PFTs as outpatient advised. Exertional dyspnea could also be secondary to systolic CHF for which he is under treatment at present.   Systolic CHF - On oral lasix, does not appear volume overloaded. On spironolactone given <30% EF. Will need cardiology follow up on discharge.   Afib/flutter - Xarelto and amiodarone started, tolerating well.  QTc prolongation -  596>529>559>509>532. We will advise this to be followed upon discharge.  Hypertension - Blood pressure uncontrolled. On coreg, amiodarone, furosemide, lisinopril, spironolactone. We could go up on lisinopril today.   Would need to touch base with cardiology for discharge planning - today/tomorrow if cardiology is okay with it.   I have discussed the care of this patient with my IM team residents. Please see the resident note for details. [place], [place] 02/23/2015, 3:52 PM.

## 2015-02-23 NOTE — Progress Notes (Signed)
Inpatient Diabetes Program Recommendations  AACE/ADA: New Consensus Statement on Inpatient Glycemic Control (2013)  Target Ranges:  Prepandial:   less than 140 mg/dL      Peak postprandial:   less than 180 mg/dL (1-2 hours)      Critically ill patients:  140 - 180 mg/dL   Results for Nicholas Caldwell, Nicholas Caldwell (MRN [id number]) as of 02/23/2015 09:19  Ref. Range 02/22/2015 08:13 02/22/2015 12:12 02/22/2015 16:39 02/22/2015 21:33 02/23/2015 07:58  Glucose-Capillary Latest Ref Range: 70-99 mg/dL 154 (H) 224 (H) 185 (H) 210 (H) 175 (H)   Diabetes history: DM2 Outpatient Diabetes medications: Glipizide 20 mg BID, Metformin 1000 mg BID Current orders for Inpatient glycemic control: Novolog 0-20 units TID with meals, Novolog 0-5 units HS, Glipizide 20 mg BID  Inpatient Diabetes Program Recommendations Oral Agents: If appropriate, may want to consider resuming outpatient dose of Metformin which is 1000 mg BID.  Thanks, Barnie Alderman, RN, MSN, CCRN, CDE Diabetes Coordinator Inpatient Diabetes Program [phone] (Team Pager from [facility] to [place] [facility]) [phone] ([facility] office) [phone] [facility] office)

## 2015-02-23 NOTE — Discharge Instructions (Signed)
Please follow-up with your primary care doctor regarding further management of your hip pain. Please also follow up with your cardiologist for further management of your heart rhythm.  Information on my medicine - XARELTO (Rivaroxaban)  This medication education was reviewed with me or my healthcare representative as part of my discharge preparation.  The pharmacist that spoke with me during my hospital stay was:  Bajbus, Lauren, RPH  Why was Xarelto prescribed for you? Xarelto was prescribed for you to reduce the risk of a blood clot forming that can cause a stroke if you have a medical condition called atrial fibrillation (a type of irregular heartbeat).  What do you need to know about xarelto ? Take your Xarelto ONCE DAILY at the same time every day with your evening meal. If you have difficulty swallowing the tablet whole, you may crush it and mix in applesauce just prior to taking your dose.  Take Xarelto exactly as prescribed by your doctor and DO NOT stop taking Xarelto without talking to the doctor who prescribed the medication.  Stopping without other stroke prevention medication to take the place of Xarelto may increase your risk of developing a clot that causes a stroke.  Refill your prescription before you run out.  After discharge, you should have regular check-up appointments with your healthcare provider that is prescribing your Xarelto.  In the future your dose may need to be changed if your kidney function or weight changes by a significant amount.  What do you do if you miss a dose? If you are taking Xarelto ONCE DAILY and you miss a dose, take it as soon as you remember on the same day then continue your regularly scheduled once daily regimen the next day. Do not take two doses of Xarelto at the same time or on the same day.   Important Safety Information A possible side effect of Xarelto is bleeding. You should call your healthcare provider right away if you  experience any of the following: ? Bleeding from an injury or your nose that does not stop. ? Unusual colored urine (red or dark brown) or unusual colored stools (red or black). ? Unusual bruising for unknown reasons. ? A serious fall or if you hit your head (even if there is no bleeding).  Some medicines may interact with Xarelto and might increase your risk of bleeding while on Xarelto. To help avoid this, consult your healthcare provider or pharmacist prior to using any new prescription or non-prescription medications, including herbals, vitamins, non-steroidal anti-inflammatory drugs (NSAIDs) and supplements.  This website has more information on Xarelto: [email or web address]

## 2015-02-23 NOTE — Progress Notes (Signed)
Patient with satO2 96% on room air. Ambulating, his saturation dropped to 85% and he was complaining of SOB. His respiration was labored. Will continue to monitor.

## 2015-02-24 NOTE — Discharge Summary (Signed)
Name: Nicholas Caldwell MRN: [id number] DOB: [birth year] 69 y.o. PCP: Sofie Rower, PA-C  Date of Admission: 02/19/2015  5:36 AM Date of Discharge: 02/23/2015 Attending Physician: Dr. Madilyn Fireman  Discharge Diagnosis: Principal Problem:   Acute respiratory failure Active Problems:   Hypertension   Diabetes mellitus type 2 in obese   Smoker   Elevated troponin I level   Acute on chronic systolic CHF (congestive heart failure)   Dyspnea   Morbid obesity   Acute respiratory acidosis   Chronic pain   Prolonged Q-T interval on ECG   Acute encephalopathy   NSTEMI (non-ST elevated myocardial infarction)   Atrial flutter with rapid ventricular response   Paroxysmal atrial fibrillation  Discharge Medications:   Medication List    STOP taking these medications        levofloxacin 500 MG tablet  Commonly known as:  LEVAQUIN     methadone 10 MG tablet  Commonly known as:  DOLOPHINE     oxyCODONE-acetaminophen 10-325 MG per tablet  Commonly known as:  PERCOCET     theophylline 400 MG 24 hr tablet  Commonly known as:  UNIPHYL      TAKE these medications        albuterol 108 (90 BASE) MCG/ACT inhaler  Commonly known as:  PROVENTIL HFA;VENTOLIN HFA  Inhale 2 puffs into the lungs every 6 (six) hours as needed for wheezing or shortness of breath.     amiodarone 200 MG tablet  Commonly known as:  PACERONE  Take 1 tablet (200 mg) twice a day until 03/01/2015. On 03/01/2015, start taking 1 tablet once a day.     aspirin 81 MG chewable tablet  Chew 1 tablet (81 mg total) by mouth daily.     carvedilol 6.25 MG tablet  Commonly known as:  COREG  Take 3 tablets (18.75 mg total) by mouth 2 (two) times daily with a meal.     diclofenac sodium 1 % Gel  Commonly known as:  VOLTAREN  Apply 2 g topically 4 (four) times daily.     furosemide 40 MG tablet  Commonly known as:  LASIX  Take 1 tablet (40 mg total) by mouth 2 (two) times daily.     glipiZIDE 10 MG tablet    Commonly known as:  GLUCOTROL  Take 20 mg by mouth 2 (two) times daily before a meal.     lisinopril 30 MG tablet  Commonly known as:  PRINIVIL,ZESTRIL  Take 1 tablet (30 mg total) by mouth daily.     metFORMIN 1000 MG tablet  Commonly known as:  GLUCOPHAGE  Take 1,000 mg by mouth 2 (two) times daily with a meal.     potassium chloride SA 20 MEQ tablet  Commonly known as:  K-DUR,KLOR-CON  Take 2 tablets (40 mEq total) by mouth daily.     rivaroxaban 20 MG Tabs tablet  Commonly known as:  XARELTO  Take 1 tablet (20 mg total) by mouth daily with supper.     rosuvastatin 20 MG tablet  Commonly known as:  CRESTOR  Take 20 mg by mouth daily.     spironolactone 25 MG tablet  Commonly known as:  ALDACTONE  Take 0.5 tablets (12.5 mg total) by mouth daily.     Vitamin D (Ergocalciferol) 50000 UNITS Caps capsule  Commonly known as:  DRISDOL  Take 50,000 Units by mouth every 7 (seven) days. On Sunday.       Disposition and follow-up:   NicholasNicholas Caldwell was discharged  from [facility] in Stable condition.  At the hospital follow up visit please address:  Acute hypoxic respiratory failure in the setting of systolic congestive heart failure: Would assess volume status and continue to titrate Lasix as necessary. Would check potassium.  Paroxysmal atrial flutter/fibrillation with RVR: Patient will require annual eye exams, monitoring of thyroid function tests, and liver function tests on amiodarone.  Presumed COPD:  Recommend outpatient pulmonary function tests.  Chronic hip pain:  Recommend further follow-up with [facility] who is providing tramadol for further management of pain.  Hypertension: Would follow-up response to new antihypertensive regimen.  Prolonged QTC: Would follow up QTc as outpatient.   Daytime sleepiness: Would consider outpatient sleep study to evaluate for sleep apnea.  2.  Labs / imaging needed at time of follow-up: basic metabolic  panel  3.  Pending labs/ test needing follow-up: none  Follow-up Appointments: Follow-up Information    Follow up with MASSENBURG,O'LAF, PA-C On 02/27/2015.   Specialty:  Physician Assistant   Why:  @ 10 am   Contact information:   [place] [address] [phone]       Follow up with [name], PA-C On 02/28/2015.   Specialty:  Physician Assistant   Why:  at 2:40PM   Contact information:   [address] [address] [phone]       Follow up with [facility].   Why:  home oxygen   Contact information:   [address] [place] [address] [phone]       Discharge Instructions: Discharge Instructions    AMB Referral to [facility]    Complete by:  As directed   Reason for consult:  Acute HF and COPD  Diagnoses of:   Heart Failure COPD/ Pneumonia    Expected date of contact:  1-3 days (reserved for hospital discharges)     Call MD for:  difficulty breathing, headache or visual disturbances    Complete by:  As directed      Call MD for:  extreme fatigue    Complete by:  As directed      Call MD for:  hives    Complete by:  As directed      Call MD for:  persistant dizziness or light-headedness    Complete by:  As directed      Call MD for:  persistant nausea and vomiting    Complete by:  As directed      Call MD for:  redness, tenderness, or signs of infection (pain, swelling, redness, odor or green/yellow discharge around incision site)    Complete by:  As directed      Call MD for:  severe uncontrolled pain    Complete by:  As directed      Call MD for:  temperature >100.4    Complete by:  As directed      Diet - low sodium heart healthy    Complete by:  As directed      Increase activity slowly    Complete by:  As directed            Consultations:    Procedures Performed:  Dg Chest 2 View  02/23/2015   CLINICAL DATA:  Shortness of breath for 5 days  EXAM:  CHEST  2 VIEW  COMPARISON:  02/20/2015  FINDINGS: Moderate enlargement of the cardiac silhouette is reidentified with central vascular congestion. Prominence  of the interstitial markings is re- demonstrated. Trace pleural fluid is identified. No acute osseous finding.  IMPRESSION: Stable moderate cardiomegaly and minimal interstitial prominence suggesting early edema.   Electronically Signed   By: Conchita Paris M.D.   On: 02/23/2015 15:14   Dg Chest 2 View  02/20/2015   CLINICAL DATA:  Respiratory distress and fever.  EXAM: CHEST  2 VIEW  COMPARISON:  February 19, 2015.  FINDINGS: Stable cardiomegaly. No pneumothorax or pleural effusion is noted. Stable mild central pulmonary vascular congestion is noted. Some degree of perihilar and basilar edema cannot be excluded. Ossification of anterior longitudinal ligament is noted in lower thoracic spine.  IMPRESSION: Stable cardiomegaly and central pulmonary vascular congestion is noted suggesting congestive heart failure. Some degree of bilateral perihilar and basilar edema may be present.   Electronically Signed   By: Marijo Conception, M.D.   On: 02/20/2015 16:27   Dg Chest Port 1 View  02/19/2015   CLINICAL DATA:  Shortness of breath.  COPD.  Asthma.  EXAM: PORTABLE CHEST - 1 VIEW  COMPARISON:  11/03/2014 and 10/31/2014  FINDINGS: Chronic cardiomegaly. Pulmonary vascularity is normal. Haziness at the lung bases is felt to be overlying soft tissue and chronic lung disease although the patient may have small bilateral pleural effusions as well.  IMPRESSION: Chronic cardiomegaly.  Possible small bilateral effusions.   Electronically Signed   By: Lorriane Shire M.D.   On: 02/19/2015 07:05    2D Echo:   Study Conclusions  - Left ventricle: The cavity size was moderately dilated. Systolic function was severely reduced. The estimated ejection fraction was in the range of 25% to 30%. Diffuse hypokinesis. Doppler parameters are consistent with elevated  ventricular end-diastolic filling pressure. - Mitral valve: There was mild regurgitation. - Left atrium: The atrium was severely dilated. - Atrial septum: No defect or patent foramen ovale was identified.   Cardiac Cath: none  Admission HPI:   Nicholas Caldwell is a 69 year old smoker obese male with non-obstructive CAD (cath 12/08/93), systolic CHF (EF 63-87%), HTN, and DM2 who presented to the ED today via EMS with complaints of worsening SOB. History was mainly obtained by wife who is present in the room as patient was in and out of sleep. Wife reports that she went to bed around 2am and he woke up and sat on the side of the bed trying to put his oxygen on. She then woke up around 4am, and found him in his chair but unresponsive. She says he was breathing and eyes open, but appeared to have trouble breathing and was sweating. She then called EMS. He was noted to be diaphoretic with increased work of breathing and treated with 2 duoneb treatments, solumedrol 149m iv x1, and placed on BIPAP in ED. Breathing improved since in ED and transitioned to [place] O2 4L. Upon my interview, Nicholas Caldwell in and out of sleep but was more awake by the end of the interview. Both wife and patient cannot state how much oxygen he is on at home which he seems to use PRN. He does remember going to the cardiologist office last week and reports taking increased lasix dose bid x3 days but was back to daily dosing and last took his medications yesterday. He reports day leading up to admission was a "bad day" due to breathing, increased dyspnea with exertion, and sleeps with up to 5 pillows at night. He is a daily smoker but wife says he is trying  to cut back.   Of note, wife states he was told he recently had PNA by PCP but does not recall if antibiotics were given. He currently denies any chest pain, abdominal pain, dysuria, diarrhea or constipation.   Hospital Course by problem list: Principal Problem:   Acute  respiratory failure Active Problems:   Hypertension   Diabetes mellitus type 2 in obese   Smoker   Elevated troponin I level   Acute on chronic systolic CHF (congestive heart failure)   Dyspnea   Morbid obesity   Acute respiratory acidosis   Chronic pain   Prolonged Q-T interval on ECG   Acute encephalopathy   NSTEMI (non-ST elevated myocardial infarction)   Atrial flutter with rapid ventricular response   Paroxysmal atrial fibrillation   Acute hypoxic respiratory failure in the setting of systolic congestive heart failure: The patient initially presented with acute hypoxic respiratory failure requiring BiPAP in the emergency department but with quick transition to 4 L nasal cannula oxygen. Respiratory status reportedly improved after Solu-Medrol and nebulizer treatments. Patient was found to have bilateral rales on exam and was started on Lasix intravenous diuresis. Patient was net negative around 4 L within the first 24 hours of admission and had a rapid improvement in his breathing, able to saturate 100% on room air by hospital day 2. Patient's initial presenting acute encephalopathy also improved concurrently with patient's breathing status. There may have been some component of COPD exacerbation contributed to his presentation as well as he had wheezes on exam that responded to nebulizer treatments. Repeat echocardiogram showing 60-10% systolic ejection fraction and elevated end-diastolic filling pressure, grossly stable from priors. Patient was discharged home on home dosage of Lasix 40 mg twice a day.  Paroxysmal atrial flutter/fibrillation with RVR: Patient with atrial flutter/fibrillation upon hospital admission. This was complicated by demand ischemia though troponins trended back down. Patient was noted to have previous evidence of flutter on prior EKGs from prior admissions. Patient was felt to be a poor candidate for genetic study and tikosyn therapy (prolonged QTc). He was also felt  to be a poor candidate for ablation given enlarged atrial size although it remains a possibility per cardiology. Because of this, patient was started on amiodarone 200 mg twice a day for one week and daily afterwards (started on 02/22/2015). Patient was also started on Xarelto given a CHA2DS2-VASc of 4. Patient was also up titrated on his Coreg to 18.75 mg twice a day. Patient has follow-up with electrophysiology as an outpatient. Patient will require annual eye exams, monitoring of thyroid function tests, and liver function tests on amiodarone.  Presumed COPD: Patient noted to have persistent desaturations into the 80s while ambulating. Patient intermittently had wheezes on physical exam. Patient with no pulmonary function tests as an outpatient. Patient's desaturations were not felt to be due to volume overload given response to diuresis and lack of physical exam findings upon discharge. Patient was discharged with home oxygen. Recommend outpatient pulmonary function tests.  Chronic hip pain: According to patient's pain clinic physician, patient has a long history of drug seeking behavior and abuse. He has reportedly been fired from 2 different pain clinics including his current one. Patient reported that he is on methadone 10 mg twice a day and Percocet 10-325 mg every 6 hours. Discussion with pain clinic physician revealed that patient had violated his pain contract by obtaining tramadol from a different provider and had been dismissed from the pain clinic because of this. For these reasons, patient  was not given opiates during this admission apart from one dose of tramadol. Patient was mostly managed on Tylenol and Voltaren gel without any significant reports of hip pain. Patient was discharged home with no opiates because of his high risk behavior. Recommend further follow-up with [facility] who is providing tramadol for further management of pain.  Uncontrolled type 2 diabetes: Patient's home  glipizide was initially held in the setting of a normal blood glucose levels but was re-continued with adequate control of his blood glucoses.  Hypertension: Patient's blood pressures were moderately controlled on lisinopril 20 mg daily, Coreg 12.5 mg twice a day and Lasix 40 mg twice a day by the day of discharge. Patient was discharged on this regimen with the exception of increase of lisinopril to 30 mg daily. Would follow-up response to new antihypertensive regimen.  Prolonged QTC: Patient with elevations of QTc which was thought to be initially due to Levaquin was administered prior to presentation and had peaked on his EKGs at 559. There was a reduction in his QTC after a period of potential washout although it remains elevated above 500.  Daytime sleepiness: Would consider outpatient sleep study to evaluate for sleep apnea.  Discharge Vitals:   BP 174/88 mmHg  Pulse 64  Temp(Src) 98 F (36.7 C) (Oral)  Resp 20  Ht 6' (1.829 m)  Wt 258 lb (117.028 kg)  BMI 34.98 kg/m2  SpO2 91%  Discharge Labs:  No results found for this or any previous visit (from the past 24 hour(s)).  Signed: Luan Moore, MD 02/24/2015, 2:52 PM    Services Ordered on Discharge: none Equipment Ordered on Discharge: none

## 2015-02-26 ENCOUNTER — Other Ambulatory Visit: Payer: Self-pay

## 2015-02-26 DIAGNOSIS — G894 Chronic pain syndrome: Secondary | ICD-10-CM | POA: Diagnosis not present

## 2015-02-26 DIAGNOSIS — M488X6 Other specified spondylopathies, lumbar region: Secondary | ICD-10-CM | POA: Diagnosis not present

## 2015-02-26 DIAGNOSIS — M545 Low back pain: Secondary | ICD-10-CM | POA: Diagnosis not present

## 2015-02-26 NOTE — Patient Outreach (Signed)
907am Unsuccessful attempt made to contact patient via telephone. Was able to leave a confidential message with this RNCM's contact information.

## 2015-02-28 ENCOUNTER — Other Ambulatory Visit: Payer: Medicare Other

## 2015-02-28 ENCOUNTER — Ambulatory Visit: Payer: Medicare Other | Admitting: Physician Assistant

## 2015-03-01 ENCOUNTER — Other Ambulatory Visit: Payer: Self-pay

## 2015-03-01 NOTE — Patient Outreach (Signed)
Unsuccessful attempt made to contact patient via telephone.   Will make another attempt on Monday, Mar 05, 2015

## 2015-03-05 ENCOUNTER — Other Ambulatory Visit: Payer: Self-pay

## 2015-03-05 NOTE — Patient Outreach (Signed)
Unsuccessful attempts x 3 made to contact patient via telephone for [facility] Case Management. Request sent to [name]. Laurance Flatten, [facility] CM Assistant, to discharge patient from my caseload due to inability to make contact with patient.  Letter sent to primary care provider and patient advising all of discharge and rationale.

## 2015-03-06 ENCOUNTER — Other Ambulatory Visit: Payer: Medicare Other

## 2015-03-06 NOTE — Patient Outreach (Signed)
[facility]) Care Management  03/06/2015  Nicholas Caldwell 17-Jul-1946 [id number]   Received notification from Erenest Rasher, RN to close case due to unable to contact.  [name]. [facility] CM Assistant Phone: [phone] Fax: [phone]

## 2015-03-19 DIAGNOSIS — M488X6 Other specified spondylopathies, lumbar region: Secondary | ICD-10-CM | POA: Diagnosis not present

## 2015-03-19 DIAGNOSIS — M47817 Spondylosis without myelopathy or radiculopathy, lumbosacral region: Secondary | ICD-10-CM | POA: Diagnosis not present

## 2015-03-19 DIAGNOSIS — M545 Low back pain: Secondary | ICD-10-CM | POA: Diagnosis not present

## 2015-03-26 DIAGNOSIS — Z79899 Other long term (current) drug therapy: Secondary | ICD-10-CM | POA: Diagnosis not present

## 2015-03-26 DIAGNOSIS — G894 Chronic pain syndrome: Secondary | ICD-10-CM | POA: Diagnosis not present

## 2015-04-15 NOTE — Progress Notes (Signed)
Cardiology Office Note   Date:  04/16/2015   ID:  Nicholas Caldwell, DOB [birth year], MRN [id number]  PCP:  [name]  Cardiologist:  Dr. Casandra Doffing     Chief Complaint  Patient presents with  . Congestive Heart Failure  . Atrial Fibrillation     History of Present Illness: Nicholas Caldwell is a 69 y.o. male with a hx of nonischemic cardiomyopathy, systolic HF, HTN, HL, diabetes, tobacco abuse. Cardiac catheterization January 2016 demonstrated mild nonobstructive CAD. Last seen by Dr. [name] 02/2015. The patient had been discharged from the hospital on Tribenzor, Coreg and furosemide. He was not taking the Tribenzor but would sometimes get samples from his PCP. He was not taking the medication at the time he saw Dr. [name]. He was somewhat volume overloaded and had recently been diagnosed with community-acquired pneumonia. Dr. [name] increased his diuretic and placed him on lisinopril.   Patient was then admitted 4/18-4/22 with acute on chronic systolic CHF in the setting of atrial fibrillation/flutter with RVR. He was diuresed with IV Lasix. He was followed by cardiology.  CHADS2-VASc=4.  He was placed on Xarelto for anticoagulation. He was noted to have elevated troponins. This was felt to be related to demand ischemia from rapid atrial fibrillation/flutter. His QTC was noted to be prolonged. Electrophysiology also saw the patient (Dr. Rayann Heman). He was noted to have a severely dilated left atrium. Likelihood of maintaining sinus rhythm was felt to be low. He was not felt to be a candidate for flecainide secondary cardiomyopathy. He was not felt to be a candidate for Tikosyn given his prolonged QT. Success of ablation would be limited due to left atrial size. He was screened for Genetic AF and was not felt to be a good candidate. It was ultimately decided to place him on amiodarone for rhythm control.  Plan was to follow him in the AF Clinic.  He returns for FU.  He notes increased  DOE over the past 2 weeks.  He is NYHA 3.  He sleeps on 4-5 pillows chronically without change.  He denies PND.  Denies chest pain or syncope.  Denies significant cough.  Denies wheezing.  He feels his heart racing from time to time.     Studies/Reports Reviewed Today:  Echo 02/22/15 - EF 25% to 30%. Diffuse HK. Dopplerparameters are consistent with elevated ventricular end-diastolic filling pressure. - Mitral valve: There was mild regurgitation. - Left atrium: The atrium was severely dilated. - Atrial septum: No defect or patent foramen ovale was identified.  LHC 11/06/14 LM:  Patent LAD:  Mid vessel moderate diffuse disease LCx:  Mild proximal disease.  Distal disease - vessel too small for PCI RCA:  Patent EF 25-30%, LVEDP 26 Conclusion:  Mild diffuse non-obstructive CAD; NICM  Myoview 11/02/14 IMPRESSION: 1. Inferoapical scar/infarction. No findings for myocardial ischemia. 2. Mild global hypokinesis and no contraction or wall thickening in the area of scar. 3. Left ventricular ejection fraction 32% 4. High-risk stress test findings*.  Past Medical History  Diagnosis Date  . Hypertension   . Diabetes mellitus without complication   . Hypercholesteremia   . Acute systolic CHF (congestive heart failure) 11/02/2014    Past Surgical History  Procedure Laterality Date  . Left and right heart catheterization with coronary angiogram N/A 11/06/2014    Procedure: LEFT AND RIGHT HEART CATHETERIZATION WITH CORONARY ANGIOGRAM;  Surgeon: Jettie Booze, MD;  Location: [facility] CATH LAB;  Service: Cardiovascular;  Laterality: N/A;     Current Outpatient  Prescriptions  Medication Sig Dispense Refill  . albuterol (PROVENTIL HFA;VENTOLIN HFA) 108 (90 BASE) MCG/ACT inhaler Inhale 2 puffs into the lungs every 6 (six) hours as needed for wheezing or shortness of breath. 1 Inhaler 2  . amiodarone (PACERONE) 200 MG tablet Take 1 tablet (200 mg) twice a day until 03/01/2015. On 03/01/2015,  start taking 1 tablet once a day. 40 tablet 0  . aspirin 81 MG chewable tablet Chew 1 tablet (81 mg total) by mouth daily.    . carvedilol (COREG) 6.25 MG tablet Take 3 tablets (18.75 mg total) by mouth 2 (two) times daily with a meal. 180 tablet 0  . diclofenac sodium (VOLTAREN) 1 % GEL Apply 2 g topically 4 (four) times daily. 100 g 0  . furosemide (LASIX) 40 MG tablet Take 1 tablet (40 mg total) by mouth 2 (two) times daily. 30 tablet 11  . glipiZIDE (GLUCOTROL) 10 MG tablet Take 20 mg by mouth 2 (two) times daily before a meal.    . lisinopril (PRINIVIL,ZESTRIL) 30 MG tablet Take 1 tablet (30 mg total) by mouth daily. 30 tablet 0  . metFORMIN (GLUCOPHAGE) 1000 MG tablet Take 1,000 mg by mouth 2 (two) times daily with a meal.    . potassium chloride SA (K-DUR,KLOR-CON) 20 MEQ tablet Take 2 tablets (40 mEq total) by mouth daily. 30 tablet 0  . rivaroxaban (XARELTO) 20 MG TABS tablet Take 1 tablet (20 mg total) by mouth daily with supper. 30 tablet 0  . rosuvastatin (CRESTOR) 20 MG tablet Take 20 mg by mouth daily.    Marland Kitchen spironolactone (ALDACTONE) 25 MG tablet Take 0.5 tablets (12.5 mg total) by mouth daily. 30 tablet 0  . Vitamin D, Ergocalciferol, (DRISDOL) 50000 UNITS CAPS capsule Take 50,000 Units by mouth every 7 (seven) days. On Sunday.     No current facility-administered medications for this visit.    Allergies:   Review of patient's allergies indicates no known allergies.    Social History:  The patient  reports that he has been smoking Cigarettes.  He has been smoking about 0.05 packs per day. He has never used smokeless tobacco. He reports that he does not drink alcohol or use illicit drugs.   Family History:  The patient's family history includes Heart disease in his father and mother; Hypertension in his mother and sister.    ROS:   Please see the history of present illness.   Review of Systems  Constitution: Negative for chills and fever.  Respiratory: Negative for  hemoptysis.   Gastrointestinal: Negative for diarrhea, hematochezia, melena and vomiting.  Genitourinary: Negative for hematuria.  All other systems reviewed and are negative.    PHYSICAL EXAM: VS:  BP 150/70 mmHg  Pulse 131  Ht 6' (1.829 m)  Wt 267 lb (121.11 kg)  BMI 36.20 kg/m2    Wt Readings from Last 3 Encounters:  04/16/15 267 lb (121.11 kg)  02/23/15 258 lb (117.028 kg)  02/13/15 267 lb 6.4 oz (121.292 kg)     GEN: Well nourished, well developed, in no acute distress HEENT: normal Neck: No JVD, no masses Cardiac:  Normal S1/S2, RRR; no murmur ,  no rubs or gallops, no edema   Respiratory:  clear to auscultation bilaterally, no wheezing, rhonchi or rales. GI: soft, nontender, nondistended, + BS MS: no deformity or atrophy Skin: warm and dry  Neuro:  CNs II-XII intact, Strength and sensation are intact Psych: Normal affect   EKG:  EKG is ordered today.  It demonstrates:    #1 - AFlutter, HR 131 #2 - NSR, HR 92, normal axis, nonspecific ST-T wave changes, QTc 509 ms  Recent Labs: 10/26/2014: ALT 20 02/21/2015: B Natriuretic Peptide 705.3* 02/22/2015: TSH 1.391 02/23/2015: BUN 16; Creatinine, Ser 0.97; Hemoglobin 12.7*; Magnesium 1.9; Platelets 374; Potassium 3.4*; Sodium 138    Lipid Panel    Component Value Date/Time   CHOL 147 11/01/2014 0216   TRIG 134 11/01/2014 0216   HDL 48 11/01/2014 0216   CHOLHDL 3.1 11/01/2014 0216   VLDL 27 11/01/2014 0216   LDLCALC 72 11/01/2014 0216      ASSESSMENT AND PLAN:  Chronic systolic CHF (congestive heart failure):  He is NYHA 3. However, the patient does not look particularly volume overloaded on exam. Continue current does of Lasix. I will obtain a BMET, BNP today. If BNP significantly elevated, increase Lasix.  NICM (nonischemic cardiomyopathy):  Question if this is related to tachycardia. Continue beta blocker, ACE inhibitor, spironolactone. If he continues to issues with recurrent atrial fibrillation/flutter with  uncontrolled rate, consider the addition of digoxin. Will need to consider follow-up echocardiogram in the next 2-3 months. If EF remains less than 35%, refer to EP for ICD.  Coronary artery disease involving native coronary artery of native heart without angina pectoris:  Nonobstructive coronary artery disease by recent cardiac catheterization. He is not having any symptoms of angina. Continue aspirin, statin.  Atrial fibrillation and flutter:  Patient was in atrial flutter with rapid ventricular rate when he first had his ECG today. During my exam, it sounded as though he was in normal rhythm. Repeat ECG confirmed that he was in normal sinus rhythm. Question frequency of paroxysms of atrial fibrillation/flutter. He was supposed to follow-up in the atrial fibrillation clinic. Continue Xarelto. There was some confusion about his medications today. Obtain PTT today to ensure adherence with anticoagulation. Majority of patients on Xarelto should have an elevated PTT.  -  Increase Amiodarone 200 mg bid x 1 week >> then return to 200 mg QD  -  Increase Coreg to 25 mg bid  -  Obtain BMET, CBC, LFTs, TSH  -  Obtain 2 week event monitor to assess AFib/Flutter burden.  -  Refer to AFib Clinic for FU and further recommendations if any.  Prolonged Q-T interval on ECG:  Medication list indicates that he is on Methadone. This is likely contributing to prolonged QT. If he ever comes off of this drug, Tikosyn may become an alternate option for rhythm control.  For now, he is not a Tikosyn candidate.   Essential hypertension:  Uncontrolled.  Increase Coreg as noted.   Hyperlipidemia:  Continue statin.    Smoker:  He is trying to quit.   Diabetes mellitus type 2 in obese:  FU with PCP.     Current medicines are reviewed at length with the patient today.  Concerns regarding medicines are as outlined above.  The following changes have been made:    As above  Labs/ tests ordered today include:   No orders  of the defined types were placed in this encounter.    Disposition:   Refer to AFib Clinic.  FU with me or Dr. Casandra Doffing in 4-5 weeks.    Signed, Versie Starks, MHS 04/16/2015 3:40 PM    [facility] [place], [place], [place]  [address] Phone: [phone]; Fax: [phone]

## 2015-04-16 ENCOUNTER — Encounter: Payer: Self-pay | Admitting: Physician Assistant

## 2015-04-16 ENCOUNTER — Ambulatory Visit (INDEPENDENT_AMBULATORY_CARE_PROVIDER_SITE_OTHER): Payer: Medicare Other | Admitting: Physician Assistant

## 2015-04-16 ENCOUNTER — Other Ambulatory Visit (INDEPENDENT_AMBULATORY_CARE_PROVIDER_SITE_OTHER): Payer: Medicare Other | Admitting: *Deleted

## 2015-04-16 VITALS — BP 150/70 | HR 131 | Ht 72.0 in | Wt 267.0 lb

## 2015-04-16 DIAGNOSIS — E1169 Type 2 diabetes mellitus with other specified complication: Secondary | ICD-10-CM

## 2015-04-16 DIAGNOSIS — E785 Hyperlipidemia, unspecified: Secondary | ICD-10-CM | POA: Diagnosis not present

## 2015-04-16 DIAGNOSIS — E119 Type 2 diabetes mellitus without complications: Secondary | ICD-10-CM

## 2015-04-16 DIAGNOSIS — R9431 Abnormal electrocardiogram [ECG] [EKG]: Secondary | ICD-10-CM

## 2015-04-16 DIAGNOSIS — I429 Cardiomyopathy, unspecified: Secondary | ICD-10-CM | POA: Diagnosis not present

## 2015-04-16 DIAGNOSIS — I5022 Chronic systolic (congestive) heart failure: Secondary | ICD-10-CM

## 2015-04-16 DIAGNOSIS — I5023 Acute on chronic systolic (congestive) heart failure: Secondary | ICD-10-CM | POA: Diagnosis not present

## 2015-04-16 DIAGNOSIS — I4892 Unspecified atrial flutter: Secondary | ICD-10-CM | POA: Diagnosis not present

## 2015-04-16 DIAGNOSIS — I4891 Unspecified atrial fibrillation: Secondary | ICD-10-CM

## 2015-04-16 DIAGNOSIS — E669 Obesity, unspecified: Secondary | ICD-10-CM

## 2015-04-16 DIAGNOSIS — I2510 Atherosclerotic heart disease of native coronary artery without angina pectoris: Secondary | ICD-10-CM | POA: Diagnosis not present

## 2015-04-16 DIAGNOSIS — Z7901 Long term (current) use of anticoagulants: Secondary | ICD-10-CM

## 2015-04-16 DIAGNOSIS — I4581 Long QT syndrome: Secondary | ICD-10-CM

## 2015-04-16 DIAGNOSIS — I428 Other cardiomyopathies: Secondary | ICD-10-CM

## 2015-04-16 DIAGNOSIS — I10 Essential (primary) hypertension: Secondary | ICD-10-CM

## 2015-04-16 DIAGNOSIS — Z720 Tobacco use: Secondary | ICD-10-CM

## 2015-04-16 DIAGNOSIS — F17200 Nicotine dependence, unspecified, uncomplicated: Secondary | ICD-10-CM

## 2015-04-16 MED ORDER — CARVEDILOL 25 MG PO TABS
25.0000 mg | ORAL_TABLET | Freq: Two times a day (BID) | ORAL | Status: DC
Start: 2015-04-16 — End: 2016-01-21

## 2015-04-16 NOTE — Patient Instructions (Addendum)
Medication Instructions:  1. INCREASE AMIODARONE TO 200 MG 1 TABLET TWICE DAILY FOR 1 WEEK THEN GO BACK TO 200 MG ONCE A DAY  2. INCREASE COREG TO 25 MG TWICE DAILY  Labwork: 1. TODAY ; BMET, CBC W/DIFF, TSH, LFT, BNP, PTT  Testing/Procedures: Your physician has recommended that you wear an event monitor PER SCOTT WEAVER, PAC WANTS MONITOR THAT ASSESS A-FIB/FLUTTER BURDEN; WANTS PT TO WEAR FOR 2 WEEKS. Event monitors are medical devices that record the heart's electrical activity. Doctors most often [place] these monitors to diagnose arrhythmias. Arrhythmias are problems with the speed or rhythm of the heartbeat. The monitor is a small, portable device. You can wear one while you do your normal daily activities. This is usually used to diagnose what is causing palpitations/syncope (passing out).  Follow-Up: 1. [place] 05/14/15 @ 2:40  2. YOU HAVE BEEN REFERRED TO THE A-FIB CLINIC FOR AN APPT IN THE NEXT 2 WEEKS; TO SEE DR. ALLRED/DONNA CARROLL   Any Other Special Instructions Will Be Listed Below (If Applicable).  BRING ALL OF YOUR MEDICATIONS THAT YOU ARE CURRENTLY TAKING TO YOUR NEXT VISIT

## 2015-04-17 ENCOUNTER — Telehealth: Payer: Self-pay | Admitting: *Deleted

## 2015-04-17 LAB — CBC WITH DIFFERENTIAL/PLATELET
BASOS PCT: 0.2 % (ref 0.0–3.0)
Basophils Absolute: 0 10*3/uL (ref 0.0–0.1)
EOS PCT: 0.8 % (ref 0.0–5.0)
Eosinophils Absolute: 0.1 10*3/uL (ref 0.0–0.7)
HCT: 43 % (ref 39.0–52.0)
Hemoglobin: 13.8 g/dL (ref 13.0–17.0)
Lymphocytes Relative: 25.5 % (ref 12.0–46.0)
Lymphs Abs: 2.1 10*3/uL (ref 0.7–4.0)
MCHC: 32 g/dL (ref 30.0–36.0)
MCV: 84.7 fl (ref 78.0–100.0)
MONOS PCT: 5.7 % (ref 3.0–12.0)
Monocytes Absolute: 0.5 10*3/uL (ref 0.1–1.0)
Neutro Abs: 5.5 10*3/uL (ref 1.4–7.7)
Neutrophils Relative %: 67.8 % (ref 43.0–77.0)
Platelets: 342 10*3/uL (ref 150.0–400.0)
RBC: 5.08 Mil/uL (ref 4.22–5.81)
RDW: 14.8 % (ref 11.5–15.5)
WBC: 8.1 10*3/uL (ref 4.0–10.5)

## 2015-04-17 LAB — BASIC METABOLIC PANEL
BUN: 13 mg/dL (ref 6–23)
CHLORIDE: 100 meq/L (ref 96–112)
CO2: 29 meq/L (ref 19–32)
Calcium: 9.2 mg/dL (ref 8.4–10.5)
Creatinine, Ser: 0.83 mg/dL (ref 0.40–1.50)
GFR: 118.86 mL/min (ref >60.00)
Glucose, Bld: 205 mg/dL — ABNORMAL HIGH (ref 70–99)
Potassium: 3.9 mEq/L (ref 3.5–5.1)
SODIUM: 137 meq/L (ref 135–145)

## 2015-04-17 LAB — TSH: TSH: 1.97 u[IU]/mL (ref 0.35–4.50)

## 2015-04-17 LAB — HEPATIC FUNCTION PANEL
ALT: 17 U/L (ref 0–53)
AST: 17 U/L (ref 0–37)
Albumin: 3.8 g/dL (ref 3.5–5.2)
Alkaline Phosphatase: 137 U/L — ABNORMAL HIGH (ref 39–117)
BILIRUBIN TOTAL: 0.3 mg/dL (ref 0.2–1.2)
Bilirubin, Direct: 0 mg/dL (ref 0.0–0.3)
Total Protein: 7.7 g/dL (ref 6.0–8.3)

## 2015-04-17 LAB — BRAIN NATRIURETIC PEPTIDE: PRO B NATRI PEPTIDE: 308 pg/mL — AB (ref 0.0–100.0)

## 2015-04-17 LAB — APTT: aPTT: 24.9 s (ref 23.4–32.7)

## 2015-04-17 NOTE — Telephone Encounter (Signed)
lmptcb to go over lab results and med changes

## 2015-04-18 DIAGNOSIS — G894 Chronic pain syndrome: Secondary | ICD-10-CM | POA: Diagnosis not present

## 2015-04-18 DIAGNOSIS — Z79899 Other long term (current) drug therapy: Secondary | ICD-10-CM | POA: Diagnosis not present

## 2015-04-19 NOTE — Telephone Encounter (Signed)
Pt notified of lab results and med changes for the next 3 days on lasix and K+; after 3 days resume current doses. Pt readb directions to me x 2 with verbal understanding to instructions.

## 2015-05-10 ENCOUNTER — Inpatient Hospital Stay (HOSPITAL_COMMUNITY): Admission: RE | Admit: 2015-05-10 | Payer: Medicare Other | Source: Ambulatory Visit | Admitting: Nurse Practitioner

## 2015-05-11 ENCOUNTER — Encounter: Payer: Self-pay | Admitting: Nurse Practitioner

## 2015-05-13 NOTE — Progress Notes (Signed)
Cardiology Office Note   Date:  05/14/2015   ID:  Nicholas Caldwell, DOB 01-25-1946, MRN [id number]  PCP:  [name]  Cardiologist:  Dr. Casandra Doffing     Chief Complaint  Patient presents with  . Atrial Fibrillation  . Congestive Heart Failure     History of Present Illness: Nicholas Caldwell is a 69 y.o. male with a hx of nonischemic cardiomyopathy, systolic HF, HTN, HL, diabetes, tobacco abuse. Cardiac catheterization January 2016 demonstrated mild nonobstructive CAD.   Patient was then admitted 03/6432 with a/c systolic HF in the setting of atrial fibrillation/flutter with RVR.  CHADS2-VASc=4.  He was placed on Xarelto for anticoagulation. He was noted to have elevated troponins that was felt to be related to demand ischemia from rapid atrial fibrillation/flutter. His QTC was prolonged and he was seen by Dr. Rayann Heman for EP. LAE is severe and the likelihood of maintaining NSR was felt to be low. He was not felt to be a candidate for flecainide secondary cardiomyopathy or Tikosyn given his prolonged QT. Success of ablation would be limited due to left atrial size. He was screened for Genetic AF and was not felt to be a good candidate. It was ultimately decided to place him on amiodarone for rhythm control.  Plan was to follow him in the AF Clinic.  I saw him 04/16/15.  He was in AFlutter with RVR in the office.  Repeat ECG demonstrated NSR.  I increased his Amiodarone for a week and increased his Carvedilol.  He was supposed to FU in the AF Clinic.  However, he failed to show up for this appointment.  He returns for FU.     Studies/Reports Reviewed Today:  Echo 02/22/15 - EF 25% to 30%. Diffuse HK. Dopplerparameters are consistent with elevated ventricular end-diastolic filling pressure. - Mitral valve: There was mild regurgitation. - Left atrium: The atrium was severely dilated. - Atrial septum: No defect or patent foramen ovale was identified.  LHC 11/06/14 LM:  Patent LAD:   Mid vessel moderate diffuse disease LCx:  Mild proximal disease.  Distal disease - vessel too small for PCI RCA:  Patent EF 25-30%, LVEDP 26 Conclusion:  Mild diffuse non-obstructive CAD; NICM  Myoview 11/02/14 IMPRESSION: 1. Inferoapical scar/infarction. No findings for myocardial ischemia. 2. Mild global hypokinesis and no contraction or wall thickening in the area of scar. 3. Left ventricular ejection fraction 32% 4. High-risk stress test findings*.   Past Medical History  Diagnosis Date  . Hypertension   . Diabetes mellitus without complication   . Hypercholesteremia   . Acute systolic CHF (congestive heart failure) 11/02/2014    Past Surgical History  Procedure Laterality Date  . Left and right heart catheterization with coronary angiogram N/A 11/06/2014    Procedure: LEFT AND RIGHT HEART CATHETERIZATION WITH CORONARY ANGIOGRAM;  Surgeon: Jettie Booze, MD;  Location: [facility] CATH LAB;  Service: Cardiovascular;  Laterality: N/A;     Current Outpatient Prescriptions  Medication Sig Dispense Refill  . albuterol (PROVENTIL HFA;VENTOLIN HFA) 108 (90 BASE) MCG/ACT inhaler Inhale 2 puffs into the lungs every 6 (six) hours as needed for wheezing or shortness of breath. 1 Inhaler 2  . amiodarone (PACERONE) 200 MG tablet Take 1 tablet (200 mg) twice a day until 03/01/2015. On 03/01/2015, start taking 1 tablet once a day. 40 tablet 0  . aspirin 81 MG chewable tablet Chew 1 tablet (81 mg total) by mouth daily.    . carvedilol (COREG) 25 MG tablet Take 1 tablet (  25 mg total) by mouth 2 (two) times daily with a meal. 60 tablet 11  . diclofenac sodium (VOLTAREN) 1 % GEL Apply 2 g topically 4 (four) times daily. 100 g 0  . furosemide (LASIX) 40 MG tablet Take 1 tablet (40 mg total) by mouth 2 (two) times daily. 30 tablet 11  . gabapentin (NEURONTIN) 300 MG capsule Take 300 mg by mouth daily.  6  . glipiZIDE (GLUCOTROL) 10 MG tablet Take 20 mg by mouth 2 (two) times daily before a meal.      . lisinopril (PRINIVIL,ZESTRIL) 30 MG tablet Take 1 tablet (30 mg total) by mouth daily. 30 tablet 0  . metFORMIN (GLUCOPHAGE) 1000 MG tablet Take 1,000 mg by mouth 2 (two) times daily with a meal.    . methadone (DOLOPHINE) 10 MG tablet Take 10 mg by mouth daily.  0  . potassium chloride SA (K-DUR,KLOR-CON) 20 MEQ tablet Take 2 tablets (40 mEq total) by mouth daily. 30 tablet 0  . rivaroxaban (XARELTO) 20 MG TABS tablet Take 1 tablet (20 mg total) by mouth daily with supper. 30 tablet 0  . rosuvastatin (CRESTOR) 20 MG tablet Take 20 mg by mouth daily.    Marland Kitchen spironolactone (ALDACTONE) 25 MG tablet Take 0.5 tablets (12.5 mg total) by mouth daily. 30 tablet 0  . Vitamin D, Ergocalciferol, (DRISDOL) 50000 UNITS CAPS capsule Take 50,000 Units by mouth every 7 (seven) days. On Sunday.     No current facility-administered medications for this visit.    Allergies:   Review of patient's allergies indicates no known allergies.    Social History:  The patient  reports that he has been smoking Cigarettes.  He has been smoking about 0.05 packs per day. He has never used smokeless tobacco. He reports that he does not drink alcohol or use illicit drugs.   Family History:  The patient's family history includes Heart disease in his father and mother; Hypertension in his mother and sister.    ROS:   Please see the history of present illness.   Review of Systems  All other systems reviewed and are negative.    PHYSICAL EXAM: VS:  There were no vitals taken for this visit.    Wt Readings from Last 3 Encounters:  04/16/15 267 lb (121.11 kg)  02/23/15 258 lb (117.028 kg)  02/13/15 267 lb 6.4 oz (121.292 kg)     GEN: Well nourished, well developed, in no acute distress HEENT: normal Neck: No JVD, no masses Cardiac:  Normal S1/S2, RRR; no murmur ,  no rubs or gallops, no edema   Respiratory:  clear to auscultation bilaterally, no wheezing, rhonchi or rales. GI: soft, nontender, nondistended, +  BS MS: no deformity or atrophy Skin: warm and dry  Neuro:  CNs II-XII intact, Strength and sensation are intact Psych: Normal affect   EKG:  EKG is ordered today.  It demonstrates:      Recent Labs: 02/21/2015: B Natriuretic Peptide 705.3* 02/23/2015: Magnesium 1.9 04/16/2015: ALT 17; BUN 13; Creatinine, Ser 0.83; Hemoglobin 13.8; Platelets 342.0; Potassium 3.9; Pro B Natriuretic peptide (BNP) 308.0*; Sodium 137; TSH 1.97    Lipid Panel    Component Value Date/Time   CHOL 147 11/01/2014 0216   TRIG 134 11/01/2014 0216   HDL 48 11/01/2014 0216   CHOLHDL 3.1 11/01/2014 0216   VLDL 27 11/01/2014 0216   LDLCALC 72 11/01/2014 0216      ASSESSMENT AND PLAN:  1.  Chronic systolic CHF (congestive heart  failure):   2.  NICM (nonischemic cardiomyopathy):  Question if this is related to tachycardia. Continue beta blocker, ACE inhibitor, spironolactone.  Will need to consider follow-up echocardiogram in the next 2-3 months. If EF remains less than 35%, refer to EP for ICD. 3.  Coronary artery disease:  Nonobstructive coronary artery disease by recent cardiac catheterization. He is not having any symptoms of angina. Continue aspirin, statin.  4.  Atrial fibrillation and flutter:   5.  Prolonged Q-T interval on ECG:  Medication list indicates that he is on Methadone. This is likely contributing to prolonged QT. If he ever comes off of this drug, Tikosyn may become an alternate option for rhythm control.  For now, he is not a Tikosyn candidate.  6.  Essential hypertension:   7.  Hyperlipidemia:  Continue statin.   8.  Smoker:  He is trying to quit.     Medication Adjustments: Current medicines are reviewed at length with the patient today.  Concerns regarding medicines are as outlined above.  The following changes have been made:   Modified Medications   No medications on file   Discontinued Medications   No medications on file   New Prescriptions   No medications on file    Labs/  tests ordered today include:   No orders of the defined types were placed in this encounter.     Disposition:   FU .    Signed, Versie Starks, MHS 05/14/2015 1:50 PM    [facility] [place], [place], [place]  [address] Phone: [phone]; Fax: [phone]    This encounter was created in error - please disregard.

## 2015-05-14 ENCOUNTER — Encounter: Payer: Medicare Other | Admitting: Physician Assistant

## 2015-05-16 ENCOUNTER — Encounter: Payer: Self-pay | Admitting: Physician Assistant

## 2015-05-21 DIAGNOSIS — G894 Chronic pain syndrome: Secondary | ICD-10-CM | POA: Diagnosis not present

## 2015-05-21 DIAGNOSIS — E669 Obesity, unspecified: Secondary | ICD-10-CM | POA: Diagnosis not present

## 2015-05-21 DIAGNOSIS — M4696 Unspecified inflammatory spondylopathy, lumbar region: Secondary | ICD-10-CM | POA: Diagnosis not present

## 2015-05-21 DIAGNOSIS — Z79899 Other long term (current) drug therapy: Secondary | ICD-10-CM | POA: Diagnosis not present

## 2015-05-21 DIAGNOSIS — M5137 Other intervertebral disc degeneration, lumbosacral region: Secondary | ICD-10-CM | POA: Diagnosis not present

## 2015-05-21 DIAGNOSIS — M47817 Spondylosis without myelopathy or radiculopathy, lumbosacral region: Secondary | ICD-10-CM | POA: Diagnosis not present

## 2015-05-21 DIAGNOSIS — M488X6 Other specified spondylopathies, lumbar region: Secondary | ICD-10-CM | POA: Diagnosis not present

## 2015-06-04 DIAGNOSIS — M488X6 Other specified spondylopathies, lumbar region: Secondary | ICD-10-CM | POA: Diagnosis not present

## 2015-06-04 DIAGNOSIS — M4696 Unspecified inflammatory spondylopathy, lumbar region: Secondary | ICD-10-CM | POA: Diagnosis not present

## 2015-06-04 DIAGNOSIS — M5137 Other intervertebral disc degeneration, lumbosacral region: Secondary | ICD-10-CM | POA: Diagnosis not present

## 2015-06-04 DIAGNOSIS — M47817 Spondylosis without myelopathy or radiculopathy, lumbosacral region: Secondary | ICD-10-CM | POA: Diagnosis not present

## 2015-06-18 DIAGNOSIS — M488X6 Other specified spondylopathies, lumbar region: Secondary | ICD-10-CM | POA: Diagnosis not present

## 2015-06-18 DIAGNOSIS — E669 Obesity, unspecified: Secondary | ICD-10-CM | POA: Diagnosis not present

## 2015-06-18 DIAGNOSIS — Z79899 Other long term (current) drug therapy: Secondary | ICD-10-CM | POA: Diagnosis not present

## 2015-06-18 DIAGNOSIS — M4696 Unspecified inflammatory spondylopathy, lumbar region: Secondary | ICD-10-CM | POA: Diagnosis not present

## 2015-06-18 DIAGNOSIS — M47817 Spondylosis without myelopathy or radiculopathy, lumbosacral region: Secondary | ICD-10-CM | POA: Diagnosis not present

## 2015-06-18 DIAGNOSIS — M5137 Other intervertebral disc degeneration, lumbosacral region: Secondary | ICD-10-CM | POA: Diagnosis not present

## 2015-06-18 DIAGNOSIS — G894 Chronic pain syndrome: Secondary | ICD-10-CM | POA: Diagnosis not present

## 2015-07-18 DIAGNOSIS — M47817 Spondylosis without myelopathy or radiculopathy, lumbosacral region: Secondary | ICD-10-CM | POA: Diagnosis not present

## 2015-07-31 DIAGNOSIS — G894 Chronic pain syndrome: Secondary | ICD-10-CM | POA: Diagnosis not present

## 2015-07-31 DIAGNOSIS — M542 Cervicalgia: Secondary | ICD-10-CM | POA: Diagnosis not present

## 2015-07-31 DIAGNOSIS — M545 Low back pain: Secondary | ICD-10-CM | POA: Diagnosis not present

## 2015-07-31 DIAGNOSIS — M47816 Spondylosis without myelopathy or radiculopathy, lumbar region: Secondary | ICD-10-CM | POA: Diagnosis not present

## 2015-07-31 DIAGNOSIS — Z79899 Other long term (current) drug therapy: Secondary | ICD-10-CM | POA: Diagnosis not present

## 2015-08-01 NOTE — Progress Notes (Signed)
Cardiology Office Note   Date:  08/01/2015   ID:  Nicholas Caldwell, DOB 1946-03-18, MRN [id number]  PCP:  Nicholas Caldwell  Cardiologist:  Dr. Casandra Caldwell   Electrophysiologist:  Dr. Thompson Caldwell   Chief Complaint  Patient presents with  . Congestive Heart Failure  . Atrial Fibrillation     History of Present Illness:  Nicholas Caldwell is a 69 y.o. male with a hx of nonischemic cardiomyopathy dx 9/14, systolic HF, HTN, HL, diabetes, tobacco abuse. Cardiac catheterization January 2016 demonstrated mild nonobstructive CAD. Last seen by Dr. Irish Caldwell 02/2015.   Patient was then admitted 7/82/95-04/23/29 with a/c systolic HF and elevated Troponin levels 2/2 demand ischemia in the setting of atrial fibrillation/flutter with RVR. CHADS2-VASc=4.  He was placed on Xarelto for anticoagulation. He was seen by EP and was not felt to be a candidate for flecainide secondary cardiomyopathy. He was not felt to be a candidate for Tikosyn given his prolonged QT. Success of ablation would be limited due to left atrial size. He was screened for Genetic AF and was not felt to be a good candidate. It was ultimately decided to place him on amiodarone for rhythm control.  Plan was to follow him in the AF Clinic.  I saw him 04/16/15. He had transient atrial flutter with RVR when I saw him. He returned to normal sinus rhythm while in the office. I increased his beta blocker and his amiodarone. He was to follow-up in atrial fibrillation clinic. He did not show for this appointment. Event monitor was arranged to assess for atrial fibrillation burden. This was never placed.  He did not show for an appt with me 05/14/15.  Returns for follow-up. Here by himself. He continues to note significant dyspnea with exertion. He is NYHA 3. He walks with a walker. He sleeps on 3-4 pillows. He denies PND. He denies LE edema. He denies chest pain. He denies syncope. He has had episodes of increasing shortness of breath like he did when he  went to the hospital. He also had one episode of lightheadedness/near syncope. He is no longer taking amiodarone or spironolactone for unclear reasons. He is somewhat of a difficult historian at times. Later in the encounter, after asking several questions, he expressed to me that he was frustrated that I kept interrupting him. I tried to ensure that he understood the importance of taking his medications and asked that he bring his medicines with him next time. I also asked that he check his list on his AVS with his medications at home and make sure that his list is correct. He was offended when I made this request. He also expressed frustration about not knowing what was wrong with his heart. I spent >45 minutes reviewing his diagnosis of nonischemic cardiomyopathy and chronic systolic heart failure in the setting of atrial fibrillation/flutter.      Studies/Reports Reviewed Today:  Echo 02/22/15 - EF 25% to 30%. Diffuse HK. Dopplerparameters are consistent with elevated ventricular end-diastolic filling pressure. - Mitral valve: There was mild regurgitation. - Left atrium: The atrium was severely dilated. - Atrial septum: No defect or patent foramen ovale was identified.  LHC 11/06/14 LM:  Patent LAD:  Mid vessel moderate diffuse disease LCx:  Mild proximal disease.  Distal disease - vessel too small for PCI RCA:  Patent EF 25-30%, LVEDP 26 Conclusion:  Mild diffuse non-obstructive CAD; NICM  Myoview 11/02/14 IMPRESSION: 1. Inferoapical scar/infarction. No findings for myocardial ischemia. 2. Mild global hypokinesis and no contraction  or wall thickening in the area of scar. 3. Left ventricular ejection fraction 32% 4. High-risk stress test findings*.  Past Medical History  Diagnosis Date  . Hypertension   . Diabetes mellitus without complication   . Hypercholesteremia   . Acute systolic CHF (congestive heart failure) 11/02/2014    Past Surgical History  Procedure Laterality Date   . Left and right heart catheterization with coronary angiogram N/A 11/06/2014    Procedure: LEFT AND RIGHT HEART CATHETERIZATION WITH CORONARY ANGIOGRAM;  Surgeon: Nicholas Booze, MD;  Location[facility] C[facility] LAB;  Service: Cardiovascular;  Laterality: N/A;     Current Outpatient Prescriptions  Medication Sig Dispense Refill  . albuterol (PROVENTIL HFA;VENTOLIN HFA) 108 (90 BASE) MCG/ACT inhaler Inhale 2 puffs into the lungs every 6 (six) hours as needed for wheezing or shortness of breath. 1 Inhaler 2  . amiodarone (PACERONE) 200 MG tablet Take 1 tablet (200 mg) twice a day until 03/01/2015. On 03/01/2015, start taking 1 tablet once a day. 40 tablet 0  . aspirin 81 MG chewable tablet Chew 1 tablet (81 mg total) by mouth daily.    . carvedilol (COREG) 25 MG tablet Take 1 tablet (25 mg total) by mouth 2 (two) times daily with a meal. 60 tablet 11  . diclofenac sodium (VOLTAREN) 1 % GEL Apply 2 g topically 4 (four) times daily. 100 g 0  . furosemide (LASIX) 40 MG tablet Take 1 tablet (40 mg total) by mouth 2 (two) times daily. 30 tablet 11  . gabapentin (NEURONTIN) 300 MG capsule Take 300 mg by mouth daily.  6  . glipiZIDE (GLUCOTROL) 10 MG tablet Take 20 mg by mouth 2 (two) times daily before a meal.    . lisinopril (PRINIVIL,ZESTRIL) 30 MG tablet Take 1 tablet (30 mg total) by mouth daily. 30 tablet 0  . metFORMIN (GLUCOPHAGE) 1000 MG tablet Take 1,000 mg by mouth 2 (two) times daily with a meal.    . methadone (DOLOPHINE) 10 MG tablet Take 10 mg by mouth daily.  0  . potassium chloride SA (K-DUR,KLOR-CON) 20 MEQ tablet Take 2 tablets (40 mEq total) by mouth daily. 30 tablet 0  . rivaroxaban (XARELTO) 20 MG TABS tablet Take 1 tablet (20 mg total) by mouth daily with supper. 30 tablet 0  . rosuvastatin (CRESTOR) 20 MG tablet Take 20 mg by mouth daily.    Marland Kitchen spironolactone (ALDACTONE) 25 MG tablet Take 0.5 tablets (12.5 mg total) by mouth daily. 30 tablet 0  . Vitamin D, Ergocalciferol, (DRISDOL)  50000 UNITS CAPS capsule Take 50,000 Units by mouth every 7 (seven) days. On Sunday.     No current facility-administered medications for this visit.    Allergies:   Review of patient's allergies indicates no known allergies.    Social History:  The patient  reports that he has been smoking Cigarettes.  He has been smoking about 0.05 packs per day. He has never used smokeless tobacco. He reports that he does not drink alcohol or use illicit drugs.   Family History:  The patient's family history includes Heart disease in his father and mother; Hypertension in his mother and sister.    ROS:   Please see the history of present illness.   Review of Systems  Constitution: Positive for diaphoresis, malaise/fatigue and weight gain.  Cardiovascular: Positive for dyspnea on exertion.  Respiratory: Positive for cough and wheezing.   Neurological: Positive for loss of balance.  All other systems reviewed and are negative.  PHYSICAL EXAM: VS:  BP 180/72 mmHg  Pulse 85  Ht 6' (1.829 m)  Wt 271 lb 6.4 oz (123.106 kg)  BMI 36.80 kg/m2  SpO2 90%  O2 with ambulation on room air 87%    Wt Readings from Last 3 Encounters:  04/16/15 267 lb (121.11 kg)  02/23/15 258 lb (117.028 kg)  02/13/15 267 lb 6.4 oz (121.292 kg)     GEN: Well nourished, well developed, in no acute distress HEENT: normal Neck: No JVD, no masses Cardiac:  Normal S1/S2, RRR; no murmur ,  no rubs or gallops, no edema   Respiratory:  clear to auscultation bilaterally, no wheezing, rhonchi or rales. GI: soft, nontender, nondistended, + BS MS: no deformity or atrophy Skin: warm and dry  Neuro:  CNs II-XII intact, Strength and sensation are intact Psych: Normal affect   EKG:  EKG is ordered today.  It demonstrates:   NSR, HR 85, normal axis, QTC 440 ms, nonspecific ST-T wave changes, no change from prior tracing   Recent Labs: 02/21/2015: B Natriuretic Peptide 705.3* 02/23/2015: Magnesium 1.9 04/16/2015: ALT 17; BUN  13; Creatinine, Ser 0.83; Hemoglobin 13.8; Platelets 342.0; Potassium 3.9; Pro B Natriuretic peptide (BNP) 308.0*; Sodium 137; TSH 1.97    Lipid Panel    Component Value Date/Time   CHOL 147 11/01/2014 0216   TRIG 134 11/01/2014 0216   HDL 48 11/01/2014 0216   CHOLHDL 3.1 11/01/2014 0216   VLDL 27 11/01/2014 0216   LDLCALC 72 11/01/2014 0216      ASSESSMENT AND PLAN:  1. Chronic systolic CHF (congestive heart failure):   He is NYHA 3. However, the patient does not look particularly volume overloaded on exam.  He is frustrated by his symptoms. His weight is up since last visit. We discussed the importance of daily weights and when to call for adjustments in his Lasix. He is off of spironolactone for unclear reasons. As noted, I tried to review with him the importance of his medications and to also make sure that he is adherent with the medications he should be taking. However, he became upset with me when I reviewed this with him. His oxygen level is 90% on room air and 87% with ambulation on room air.   -  Increase Lasix to 80 mg in a.m. and 40 mg in the p.m. 3 days, then resume 40 mg twice a day  -  Resume spironolactone 25 mg daily  -  Check BMET, CBC, BNP  -  Repeat BMET one week  -  Advised patient he needed to wear oxygen at all times. He refused to allow [place] to contact advanced home care to arrange continuous O2.  -  Return for early follow-up  -  Consider referral to the CHF clinic  -  Consider adding hydralazine, nitrates, Entresto over time  2. NICM (nonischemic cardiomyopathy):  Question if this is related to tachycardia. Continue beta blocker, ACE inhibitor, spironolactone.  Blood pressure remains uncontrolled. This may likely be contributing to his cardiomyopathy. As noted, spironolactone will be resumed. At follow-up, consider adding hydralazine/nitrates. At some point, consider adding Entresto. Once medications are maximized, consider follow-up echocardiogram to reassess LV  function. If EF remains <35%, he will need referral to EP for consideration of ICD +/- CRT.   3. Coronary artery disease involving native coronary artery of native heart without angina pectoris:  Nonobstructive coronary artery disease by recent cardiac catheterization. He is not having any symptoms of angina. Continue aspirin, statin.  4. Atrial fibrillation and flutter:  Remains in sinus rhythm today. He did not go to his appointment with atrial fibrillation clinic. He also did not get his event monitor. He is off of amiodarone for unclear reasons.   -  Resume amiodarone 200 mg twice a day 2 weeks, then 200 mg daily  -  Arrange PFTs with DLCO  -  We'll need to arrange follow-up LFTs and TSH at next visit  -  Consider follow-up with Dr. Rayann Heman if each of fibrillation recurs   5. Prolonged Q-T interval on ECG:   Avoid QT prolonging drugs.   6. Essential hypertension:  Uncontrolled.  Resume spironolactone as noted. Consider hydralazine/nitrates at follow-up.   7. Hyperlipidemia:  Continue statin.    8. Smoker:   we discussed the importance of tobacco cessation.   9. Diabetes mellitus type 2 in obese:  FU with PCP.       Medication Adjustments: Current medicines are reviewed at length with the patient today.  Concerns regarding medicines are as outlined above.  The following changes have been made:   Discontinued Medications   No medications on file   New Prescriptions   No medications on file   Modified Medications   No medications on file   Labs/ tests ordered today include:   No orders of the defined types were placed in this encounter.      Disposition:    Of note, I spent > 45 minutes with the patient today reviewing his symptoms, evaluating him and coordinating care.  As noted he was frustrated with our interaction at times.  It should be noted that the patient did tell me at one point that he suspected that I was prejudiced against African Americans.  I tried to assure  him that this is not the case.  In light of this, if he cannot trust me, I may not be the best provider for him to see in the future.  As noted, I spent a great deal of time with him today trying to reassure him about the care he is receiving. Hopefully, he can trust me and I will try to see him back in early FU to make sure that his CHF is managed as well as it can be managed.    FU me in 2 weeks and Dr. Casandra Caldwell 8 weeks.     Signed, Versie Starks, MHS 08/01/2015 10:03 PM    [facility] [address], [place], [place]  [address] Phone: [phone]; Fax: [phone]

## 2015-08-02 ENCOUNTER — Encounter: Payer: Self-pay | Admitting: Physician Assistant

## 2015-08-02 ENCOUNTER — Ambulatory Visit (INDEPENDENT_AMBULATORY_CARE_PROVIDER_SITE_OTHER): Payer: Medicare Other | Admitting: Physician Assistant

## 2015-08-02 VITALS — BP 180/72 | HR 85 | Ht 72.0 in | Wt 271.4 lb

## 2015-08-02 DIAGNOSIS — Z720 Tobacco use: Secondary | ICD-10-CM

## 2015-08-02 DIAGNOSIS — R0602 Shortness of breath: Secondary | ICD-10-CM

## 2015-08-02 DIAGNOSIS — R9431 Abnormal electrocardiogram [ECG] [EKG]: Secondary | ICD-10-CM

## 2015-08-02 DIAGNOSIS — I429 Cardiomyopathy, unspecified: Secondary | ICD-10-CM | POA: Diagnosis not present

## 2015-08-02 DIAGNOSIS — I2510 Atherosclerotic heart disease of native coronary artery without angina pectoris: Secondary | ICD-10-CM

## 2015-08-02 DIAGNOSIS — I4581 Long QT syndrome: Secondary | ICD-10-CM

## 2015-08-02 DIAGNOSIS — I5022 Chronic systolic (congestive) heart failure: Secondary | ICD-10-CM

## 2015-08-02 DIAGNOSIS — I428 Other cardiomyopathies: Secondary | ICD-10-CM

## 2015-08-02 DIAGNOSIS — I4891 Unspecified atrial fibrillation: Secondary | ICD-10-CM

## 2015-08-02 DIAGNOSIS — E785 Hyperlipidemia, unspecified: Secondary | ICD-10-CM

## 2015-08-02 DIAGNOSIS — F17200 Nicotine dependence, unspecified, uncomplicated: Secondary | ICD-10-CM

## 2015-08-02 DIAGNOSIS — I10 Essential (primary) hypertension: Secondary | ICD-10-CM

## 2015-08-02 DIAGNOSIS — I4892 Unspecified atrial flutter: Secondary | ICD-10-CM

## 2015-08-02 MED ORDER — AMIODARONE HCL 200 MG PO TABS: ORAL_TABLET | ORAL | Status: DC

## 2015-08-02 MED ORDER — SPIRONOLACTONE 25 MG PO TABS: 25.0000 mg | ORAL_TABLET | Freq: Every day | ORAL | Status: DC

## 2015-08-02 MED ORDER — FUROSEMIDE 40 MG PO TABS: 40.0000 mg | ORAL_TABLET | Freq: Two times a day (BID) | ORAL | Status: DC

## 2015-08-02 NOTE — Patient Instructions (Signed)
Medication Instructions:  Your physician has recommended you make the following change in your medication:  1. Start Amiodarone ( 200 mg ) twice a day till October 13 than go to ( 200 mg ) daily 2. Start Sprinolactone ( 25 mg ) daily 3. Increase Lasix ( 80 mg ) in the am ( 40 mg ) in the pm for 3 days than ( 40 mg ) daily   Labwork: Your physician recommends that you have  lab work today: bmet/cbc/bnp Your physician recommends that you return for lab work in one week on October 6 between 8-5   Testing/Procedures: Your physician has recommended that you have a pulmonary function test.DX: SOB/AMIODARONE  Pulmonary Function Tests are a group of tests that measure how well air moves in and out of your lungs.    Follow-Up: Your physician recommends that you keep your  scheduled follow-up appointment with [name], PA-C in two weeks Your physician recommends that you keep your scheduled follow-up appointment with Dr. [name]   Any Other Special Instructions Will Be Listed Below (If Applicable).  Oxygen walking fell to 87% Need to wear O2 all the time.

## 2015-08-03 ENCOUNTER — Telehealth: Payer: Self-pay | Admitting: *Deleted

## 2015-08-03 DIAGNOSIS — R0602 Shortness of breath: Secondary | ICD-10-CM

## 2015-08-03 DIAGNOSIS — I5022 Chronic systolic (congestive) heart failure: Secondary | ICD-10-CM

## 2015-08-03 LAB — CBC WITH DIFFERENTIAL/PLATELET
BASOS ABS: 0 10*3/uL (ref 0.0–0.1)
BASOS PCT: 0 % (ref 0.0–3.0)
EOS ABS: 0.3 10*3/uL (ref 0.0–0.7)
Eosinophils Relative: 2.9 % (ref 0.0–5.0)
HCT: 40.9 % (ref 39.0–52.0)
HEMOGLOBIN: 12.9 g/dL — AB (ref 13.0–17.0)
LYMPHS PCT: 17.2 % (ref 12.0–46.0)
Lymphs Abs: 1.6 10*3/uL (ref 0.7–4.0)
MCHC: 31.5 g/dL (ref 30.0–36.0)
MCV: 86.1 fl (ref 78.0–100.0)
Monocytes Absolute: 0.4 10*3/uL (ref 0.1–1.0)
Monocytes Relative: 4.2 % (ref 3.0–12.0)
Neutro Abs: 6.9 10*3/uL (ref 1.4–7.7)
Neutrophils Relative %: 75.7 % (ref 43.0–77.0)
Platelets: 287 10*3/uL (ref 150.0–400.0)
RBC: 4.75 Mil/uL (ref 4.22–5.81)
RDW: 14.8 % (ref 11.5–15.5)
WBC: 9.1 10*3/uL (ref 4.0–10.5)

## 2015-08-03 LAB — BASIC METABOLIC PANEL
BUN: 14 mg/dL (ref 6–23)
CHLORIDE: 104 meq/L (ref 96–112)
CO2: 25 meq/L (ref 19–32)
CREATININE: 0.91 mg/dL (ref 0.40–1.50)
Calcium: 9.1 mg/dL (ref 8.4–10.5)
GFR: 106.79 mL/min (ref >60.00)
Glucose, Bld: 293 mg/dL — ABNORMAL HIGH (ref 70–99)
Potassium: 4.1 mEq/L (ref 3.5–5.1)
SODIUM: 140 meq/L (ref 135–145)

## 2015-08-03 LAB — BRAIN NATRIURETIC PEPTIDE: Pro B Natriuretic peptide (BNP): 363 pg/mL — ABNORMAL HIGH (ref 0.0–100.0)

## 2015-08-03 NOTE — Telephone Encounter (Signed)
Lmptcb to go over lab results

## 2015-08-06 NOTE — Telephone Encounter (Signed)
His BNP was elevated some. Let us try him on a higher dose of Lasix for now instead of for just a few days. Increase Lasix to 60 mg Twice daily. FU BMET 1 week.

## 2015-08-06 NOTE — Telephone Encounter (Signed)
I s/wpt about lab results, pt verbalized understanding to results by phone. Pt states he did not really notice a difference on the higher dose of lasix for 3 days. Pt states back on 40 mg BID Lasix. Weight he states not really changed on scale at home.

## 2015-08-07 MED ORDER — FUROSEMIDE 40 MG PO TABS: 60.0000 mg | ORAL_TABLET | Freq: Two times a day (BID) | ORAL | Status: DC

## 2015-08-07 NOTE — Telephone Encounter (Signed)
S/w pt today to advise increase lasix to 60 mg BID, BMET 10/12 when he see's [name] W. PA. I will cancel the lab work for 10/6 since we are increasing his lasix again. Pt agreeable to plan of care.

## 2015-08-09 ENCOUNTER — Other Ambulatory Visit: Payer: Medicare Other

## 2015-08-15 ENCOUNTER — Ambulatory Visit: Payer: Medicare Other | Admitting: Physician Assistant

## 2015-08-24 ENCOUNTER — Telehealth: Payer: Self-pay | Admitting: Physician Assistant

## 2015-08-24 NOTE — Telephone Encounter (Signed)
Ok Tell him to call if they do not reschedule in the next 2-3 weeks. [name], PA-C   08/24/2015 1:22 PM

## 2015-08-24 NOTE — Telephone Encounter (Signed)
Will forward to [employer], PA/ Julaine Hua, CMA as an Juluis Rainier.

## 2015-08-24 NOTE — Telephone Encounter (Signed)
New message     Pulmonary having machine problems that was order by [name]. - broken, Office will call the patient to r/s his appt

## 2015-08-26 NOTE — Progress Notes (Signed)
Cardiology Office Note   Date:  08/27/2015   ID:  Nicholas Caldwell, DOB 15-Jan-1946, MRN [id number]  PCP:  Markus Jarvis [facility]:  Dr. [name] (fax # [phone]) Cardiologist:  Dr. Casandra Doffing   Electrophysiologist:  Dr. Thompson Grayer   Chief Complaint  Patient presents with  . Follow-up  . Congestive Heart Failure     History of Present Illness: Nicholas Caldwell is a 69 y.o. male with a hx of nonischemic cardiomyopathy dx 4/53, systolic HF, HTN, HL, diabetes, tobacco abuse. Cardiac catheterization January 2016 demonstrated mild nonobstructive CAD. Last seen by Dr. [name] 02/2015.   Patient was then admitted 6/46/80-01/21/21 with a/c systolic HF and elevated Troponin levels 2/2 demand ischemia in the setting of atrial fibrillation/flutter with RVR. CHADS2-VASc=4.  He was placed on Xarelto for anticoagulation. He was seen by EP and was not felt to be a candidate for flecainide secondary cardiomyopathy. He was not felt to be a candidate for Tikosyn given his prolonged QT. Success of ablation would be limited due to left atrial size. He was screened for Genetic AF and was not felt to be a good candidate. It was ultimately decided to place him on amiodarone for rhythm control.  Plan was to follow him in the AF Clinic.  I saw him 04/16/15. He had transient atrial flutter with RVR when I saw him. He returned to normal sinus rhythm while in the office. I increased his beta blocker and his amiodarone. He was to follow-up in atrial fibrillation clinic. He did not show for this appointment. Event monitor was arranged to assess for atrial fibrillation burden. This was never placed.   I saw him 08/02/15. I adjusted his diuresis and started him on spironolactone.   Returns for follow-up.  He is fairly stable. He remained short of breath with minimal activity. Sleeps on 4 pillows without change. Denies PND. Denies LE edema. Denies syncope. Does get lightheaded at times. He does note  some shakiness in his hands when he tries to do activities. Denies chest pain.    Studies/Reports Reviewed Today:  Echo 02/22/15 - EF 25% to 30%. Diffuse HK. Dopplerparameters are consistent with elevated ventricular end-diastolic filling pressure. - Mitral valve: There was mild regurgitation. - Left atrium: The atrium was severely dilated. - Atrial septum: No defect or patent foramen ovale was identified.  LHC 11/06/14 LM:  Patent LAD:  Mid vessel moderate diffuse disease LCx:  Mild proximal disease.  Distal disease - vessel too small for PCI RCA:  Patent EF 25-30%, LVEDP 26 Conclusion:  Mild diffuse non-obstructive CAD; NICM  Myoview 11/02/14 IMPRESSION: 1. Inferoapical scar/infarction. No findings for myocardial ischemia. 2. Mild global hypokinesis and no contraction or wall thickening in the area of scar. 3. Left ventricular ejection fraction 32% 4. High-risk stress test findings*.  Past Medical History  Diagnosis Date  . Hypertension   . Diabetes mellitus without complication ([place])   . Hypercholesteremia   . Acute systolic CHF (congestive heart failure) ([place]) 11/02/2014    Past Surgical History  Procedure Laterality Date  . Left and right heart catheterization with coronary angiogram N/A 11/06/2014    Procedure: LEFT AND RIGHT HEART CATHETERIZATION WITH CORONARY ANGIOGRAM;  Surgeon: Jettie Booze, MD;  Location: [facility] CATH LAB;  Service: Cardiovascular;  Laterality: N/A;     Current Outpatient Prescriptions  Medication Sig Dispense Refill  . albuterol (PROVENTIL HFA;VENTOLIN HFA) 108 (90 BASE) MCG/ACT inhaler Inhale 2 puffs into the lungs every 6 (six) hours as  needed for wheezing or shortness of breath. 1 Inhaler 2  . amiodarone (PACERONE) 200 MG tablet 400 mg twice daily for 1 week; then decrease to 200 mg twice daily 90 tablet 3  . aspirin 81 MG chewable tablet Chew 1 tablet (81 mg total) by mouth daily.    . carvedilol (COREG) 25 MG tablet Take 1 tablet (25 mg  total) by mouth 2 (two) times daily with a meal. 60 tablet 11  . diclofenac sodium (VOLTAREN) 1 % GEL Apply 2 g topically 4 (four) times daily. 100 g 0  . furosemide (LASIX) 40 MG tablet Take 1.5 tablets (60 mg total) by mouth 2 (two) times daily. 90 tablet 11  . gabapentin (NEURONTIN) 300 MG capsule Take 300 mg by mouth daily.  6  . glipiZIDE (GLUCOTROL) 10 MG tablet Take 20 mg by mouth 2 (two) times daily before a meal.    . metFORMIN (GLUCOPHAGE) 1000 MG tablet Take 1,000 mg by mouth 2 (two) times daily with a meal.    . methadone (DOLOPHINE) 10 MG tablet Take 10 mg by mouth daily.  0  . potassium chloride SA (K-DUR,KLOR-CON) 20 MEQ tablet Take 2 tablets (40 mEq total) by mouth daily. 30 tablet 0  . rivaroxaban (XARELTO) 20 MG TABS tablet Take 1 tablet (20 mg total) by mouth daily with supper. 30 tablet 0  . rosuvastatin (CRESTOR) 20 MG tablet Take 20 mg by mouth daily.    Marland Kitchen spironolactone (ALDACTONE) 25 MG tablet Take 1 tablet (25 mg total) by mouth daily. 30 tablet 11  . Vitamin D, Ergocalciferol, (DRISDOL) 50000 UNITS CAPS capsule Take 50,000 Units by mouth every 7 (seven) days. On Sunday.    . sacubitril-valsartan (ENTRESTO) 49-51 MG Take 1 tablet by mouth 2 (two) times daily. 60 tablet 1   No current facility-administered medications for this visit.    Allergies:   Review of patient's allergies indicates no known allergies.    Social History:  The patient  reports that he has been smoking Cigarettes.  He has been smoking about 0.05 packs per day. He has never used smokeless tobacco. He reports that he does not drink alcohol or use illicit drugs.   Family History:  The patient's family history includes Heart disease in his father and mother; Hypertension in his mother and sister.    ROS:   Please see the history of present illness.   Review of Systems  All other systems reviewed and are negative.    PHYSICAL EXAM: VS:  BP 152/60 mmHg  Pulse 111  Ht 6' (1.829 m)  Wt 265 lb  1.9 oz (120.258 kg)  BMI 35.95 kg/m2  O2 with ambulation on room air 87%    Wt Readings from Last 3 Encounters:  08/27/15 265 lb 1.9 oz (120.258 kg)  08/02/15 271 lb 6.4 oz (123.106 kg)  04/16/15 267 lb (121.11 kg)     GEN: Well nourished, well developed, in no acute distress HEENT: normal Neck: No JVD, no masses Cardiac:  Normal S1/S2, rapid regular rhythm; no murmur ,  no rubs or gallops, no edema   Respiratory:  clear to auscultation bilaterally, no wheezing, rhonchi or rales. GI: soft, nontender, nondistended, + BS MS: no deformity or atrophy Skin: warm and dry  Neuro:  CNs II-XII intact, Strength and sensation are intact Psych: Normal affect   EKG:  EKG is ordered today.  It demonstrates:   AFlutter, HR 111   Recent Labs: 02/21/2015: B Natriuretic Peptide 705.3* 02/23/2015:  Magnesium 1.9 04/16/2015: ALT 17; TSH 1.97 08/02/2015: BUN 14; Creatinine, Ser 0.91; Hemoglobin 12.9*; Platelets 287.0; Potassium 4.1; Pro B Natriuretic peptide (BNP) 363.0*; Sodium 140    Lipid Panel    Component Value Date/Time   CHOL 147 11/01/2014 0216   TRIG 134 11/01/2014 0216   HDL 48 11/01/2014 0216   CHOLHDL 3.1 11/01/2014 0216   VLDL 27 11/01/2014 0216   LDLCALC 72 11/01/2014 0216      ASSESSMENT AND PLAN:  1. Chronic systolic CHF (congestive heart failure):   He is NYHA 3.  Continue current dose of Lasix.  Volume appears stable.    -  Check Repeat BMET today.    -  Continue beta-blocker, Spironolactone.    -  DC Lisinopril.    -  Start Entresto 49/51 mg Twice daily.     -  Obtain FU BMET at next visit in 2 weeks.  -  He may benefit from referral to the CHF Clinic in the future.   -  Consider Hydralazine/nitrates if BP remains high.   2. NICM (nonischemic cardiomyopathy):  Question if this is related to tachycardia. Continue beta blocker, spironolactone.  Change ACE inhibitor to Praxair.   He is back in AFlutter today.  He may be having tremors from Amiodarone as well.  I think it  is worthwhile to FU with EP to see if there is anything else that can be offered for his arrhythmia.  Will need to plan FU echo soon as well to recheck EF.  If EF remains < 35%, consider ICD.   3. Coronary artery disease involving native coronary artery of native heart without angina pectoris:  Nonobstructive coronary artery disease by recent cardiac catheterization. He is not having any symptoms of angina. Continue aspirin, statin.  4. Atrial fibrillation and flutter:   He is back in AFlutter with RVR.  As noted, I am concerned that AFlutter is contributing to his DCM.  He notes some tremors.  Question if this is related to Amio.  There were no other options for rhythm control when seen in the hospital.    -  Increase Amiodarone to 400 mg bid x 1 week, then decrease to 200 mg bid  -  FU with me in 2 weeks.  Consider DCCV if he remains in [place].   -  Refer back to Dr. Thompson Grayer to see if RFCA can be offered.   -  If tremors continue, he may need to come off of Amiodarone.    -  PFTs (baseline) pending  5. Prolonged Q-T interval on ECG:   Avoid QT prolonging drugs.   6. Essential hypertension:  Uncontrolled.  Adjust medications as noted.   7. Hyperlipidemia:  Continue statin.       Medication Adjustments: Current medicines are reviewed at length with the patient today.  Concerns regarding medicines are as outlined above.  The following changes have been made:   Discontinued Medications   LISINOPRIL (PRINIVIL,ZESTRIL) 30 MG TABLET    Take 1 tablet (30 mg total) by mouth daily.   New Prescriptions   SACUBITRIL-VALSARTAN (ENTRESTO) 49-51 MG    Take 1 tablet by mouth 2 (two) times daily.   Modified Medications   Modified Medication Previous Medication   AMIODARONE (PACERONE) 200 MG TABLET amiodarone (PACERONE) 200 MG tablet      400 mg twice daily for 1 week; then decrease to 200 mg twice daily    Take 1 tablet (200 mg) twice a day until  010/13/2016. On 010/14/2016, start taking 1  tablet once a day.   Labs/ tests ordered today include:   Orders Placed This Encounter  Procedures  . Basic Metabolic Panel (BMET)  . EKG 12-Lead      Disposition:    FU with me in 2 weeks.  FU Dr. Casandra Doffing as planned. Refer back to Dr. Thompson Grayer for EP evaluation of AFlutter and NICM.    Signed, Versie Starks, MHS 08/27/2015 5:13 PM    [facility] [place], [place], [place]  [address] Phone: [phone]; Fax: [phone]

## 2015-08-27 ENCOUNTER — Ambulatory Visit (INDEPENDENT_AMBULATORY_CARE_PROVIDER_SITE_OTHER): Payer: Medicare Other | Admitting: Physician Assistant

## 2015-08-27 ENCOUNTER — Encounter: Payer: Self-pay | Admitting: Physician Assistant

## 2015-08-27 VITALS — BP 152/60 | HR 111 | Ht 72.0 in | Wt 265.1 lb

## 2015-08-27 DIAGNOSIS — I428 Other cardiomyopathies: Secondary | ICD-10-CM

## 2015-08-27 DIAGNOSIS — I5022 Chronic systolic (congestive) heart failure: Secondary | ICD-10-CM | POA: Diagnosis not present

## 2015-08-27 DIAGNOSIS — I4891 Unspecified atrial fibrillation: Secondary | ICD-10-CM | POA: Diagnosis not present

## 2015-08-27 DIAGNOSIS — I429 Cardiomyopathy, unspecified: Secondary | ICD-10-CM | POA: Diagnosis not present

## 2015-08-27 DIAGNOSIS — I4581 Long QT syndrome: Secondary | ICD-10-CM

## 2015-08-27 DIAGNOSIS — G894 Chronic pain syndrome: Secondary | ICD-10-CM | POA: Diagnosis not present

## 2015-08-27 DIAGNOSIS — E785 Hyperlipidemia, unspecified: Secondary | ICD-10-CM

## 2015-08-27 DIAGNOSIS — M545 Low back pain: Secondary | ICD-10-CM | POA: Diagnosis not present

## 2015-08-27 DIAGNOSIS — I2510 Atherosclerotic heart disease of native coronary artery without angina pectoris: Secondary | ICD-10-CM

## 2015-08-27 DIAGNOSIS — Z79899 Other long term (current) drug therapy: Secondary | ICD-10-CM | POA: Diagnosis not present

## 2015-08-27 DIAGNOSIS — I4892 Unspecified atrial flutter: Secondary | ICD-10-CM

## 2015-08-27 DIAGNOSIS — R9431 Abnormal electrocardiogram [ECG] [EKG]: Secondary | ICD-10-CM

## 2015-08-27 DIAGNOSIS — I10 Essential (primary) hypertension: Secondary | ICD-10-CM

## 2015-08-27 DIAGNOSIS — M792 Neuralgia and neuritis, unspecified: Secondary | ICD-10-CM | POA: Diagnosis not present

## 2015-08-27 LAB — BASIC METABOLIC PANEL
BUN: 13 mg/dL (ref 7–25)
CALCIUM: 9.2 mg/dL (ref 8.6–10.3)
CO2: 23 mmol/L (ref 20–31)
Chloride: 100 mmol/L (ref 98–110)
Creat: 0.91 mg/dL (ref 0.70–1.25)
Glucose, Bld: 384 mg/dL — ABNORMAL HIGH (ref 65–99)
Potassium: 4.1 mmol/L (ref 3.5–5.3)
SODIUM: 135 mmol/L (ref 135–146)

## 2015-08-27 MED ORDER — AMIODARONE HCL 200 MG PO TABS: ORAL_TABLET | ORAL | Status: DC

## 2015-08-27 MED ORDER — SACUBITRIL-VALSARTAN 49-51 MG PO TABS: 1.0000 | ORAL_TABLET | Freq: Two times a day (BID) | ORAL | Status: DC

## 2015-08-27 NOTE — Telephone Encounter (Signed)
Pt seeing [name], [facility] today

## 2015-08-27 NOTE — Patient Instructions (Addendum)
Medication Instructions:  1. STOP LISINOPRIL  2. START ENTRESTO 49/51 MG TWICE DAILY; YOU WILL START THIS 36 HOURS AFTER YOUR LAST DOSE OF LISINOPRIL   3. INCREASE AMIODARONE TO 400 MG TWICE DAILY FOR 1 WEEK THEN DECREASE TO 200 MG TWICE DAILY  Labwork: TODAY BMET  Testing/Procedures: NONE  Follow-Up: 1. SCOTT WEAVER, PAC IN 2 WEEKS  2. YOU WILL NEED TO FOLLOW UP WITH DR. ALLRED IN THE NEXT 4 WEEKS; DR. [place]; DX A-FLUTTER  3. KEEP YOUR APPT WITH DR. VARANASI IN 10/2015 AS PLANNED ALREADY  Any Other Special Instructions Will Be Listed Below (If Applicable).     If you need a refill on your cardiac medications before your next appointment, please call your pharmacy.

## 2015-08-29 NOTE — Telephone Encounter (Signed)
Pt notified of lab results by phone with verbal understanding

## 2015-09-11 ENCOUNTER — Encounter: Payer: Self-pay | Admitting: Physician Assistant

## 2015-09-11 NOTE — Progress Notes (Signed)
Cardiology Office Note   Date:  09/12/2015   ID:  Nicholas Caldwell, DOB 11-06-1945, MRN [id number]   Patient Care Team: Sofie Rower, PA-C as PCP - General (Physician Assistant) Tobi Bastos, RN as Registered Nurse Jettie Booze, MD as Consulting Physician (Cardiology) Liliane Shi, PA-C as Physician Assistant (Cardiology) [name], MD as Attending Physician (Internal Medicine) Thompson Grayer, MD as Consulting Physician (Clinical Cardiac Electrophysiology)    Chief Complaint  Patient presents with  . Follow-up  . Congestive Heart Failure  . Atrial Fibrillation     History of Present Illness: Nicholas Caldwell is a 69 y.o. male with a hx of nonischemic cardiomyopathy dx 2/63, systolic HF, HTN, HL, diabetes, tobacco abuse. Cardiac catheterization January 2016 demonstrated mild nonobstructive CAD.   Admitted 7/85 with a/c systolic HF and elevated Troponin levels 2/2 demand ischemia in the setting of atrial fibrillation/flutter with RVR. CHADS2-VASc=4.  He was placed on Xarelto for anticoagulation. He was seen by EP and was not felt to be a candidate for flecainide secondary cardiomyopathy. He was not felt to be a candidate for Tikosyn given his prolonged QT. Success of ablation would be limited due to left atrial size. He was screened for Genetic AF and was not felt to be a good candidate. It was ultimately decided to place him on amiodarone for rhythm control.  Plan was to follow him in the AF Clinic but he was lost to FU.   He has had transient episodes of AFlutter noted in the office in the past.  Last seen by me 08/27/15.  He was back in AFlutter with RVR.  I adjusted his Amiodarone.  I changed his ACE inhibitor to Praxair (ARNI).  I have arranged FU with EP to see if he can be considered for RFCA as his arrhythmia may be contributing to his DCM.  He returns for further management of his CHF as well as to reassess his rhythm with an eye towards DCCV if he remains in  AFlutter.  He is still in AFlutter with RVR.  He denies palpitations.  He notes DOE but this is overall improved.  He denies syncope. He used to sleep on 4 pillows and now sleeps on 2. Denies PND. Denies LE edema.     Studies/Reports Reviewed Today:  Echo 02/22/15 EF 25-30%, diffuse HK, mild MR, severe LAE  LHC 11/06/14 LM:  Patent LAD:  Mid vessel moderate diffuse disease LCx:  Mild proximal disease.  Distal disease - vessel too small for PCI RCA:  Patent EF 25-30%, LVEDP 26 Conclusion:  Mild diffuse non-obstructive CAD; NICM  Myoview 11/02/14 IMPRESSION: 1. Inferoapical scar/infarction. No findings for myocardial ischemia. 2. Mild global hypokinesis and no contraction or wall thickening in the area of scar. 3. Left ventricular ejection fraction 32% 4. High-risk stress test findings*.   Past Medical History  Diagnosis Date  . Hypertension   . Diabetes mellitus without complication ([place])   . Hypercholesteremia   . Chronic systolic CHF (congestive heart failure) ([place])   . NICM (nonischemic cardiomyopathy) ([place])     a. Echo 4/16:  EF 25-30%, diffuse HK, mild MR, severe LAE  . CAD (coronary artery disease)     a. LHC 1/16:  mLAD diffuse disease, pLCx mild disease, dLCx with disease but too small for PCI, RCA ok, EF 25-30%  . Tobacco abuse   . Atrial flutter ([place])     a. recurrent AFlutter with RVR;  b. Amiodarone Rx started 4/16  . Prolonged  Q-T interval on ECG   . COPD (chronic obstructive pulmonary disease) [facility])     Past Surgical History  Procedure Laterality Date  . Left and right heart catheterization with coronary angiogram N/A 11/06/2014    Procedure: LEFT AND RIGHT HEART CATHETERIZATION WITH CORONARY ANGIOGRAM;  Surgeon: Jettie Booze, MD;  Location: [facility] CATH LAB;  Service: Cardiovascular;  Laterality: N/A;     Current Outpatient Prescriptions  Medication Sig Dispense Refill  . albuterol (PROVENTIL HFA;VENTOLIN HFA) 108 (90 BASE) MCG/ACT inhaler Inhale 2 puffs  into the lungs every 6 (six) hours as needed for wheezing or shortness of breath. 1 Inhaler 2  . amiodarone (PACERONE) 200 MG tablet 400 mg twice daily for 1 week; then decrease to 200 mg twice daily 90 tablet 3  . aspirin 81 MG chewable tablet Chew 1 tablet (81 mg total) by mouth daily.    . carvedilol (COREG) 25 MG tablet Take 1 tablet (25 mg total) by mouth 2 (two) times daily with a meal. 60 tablet 11  . diclofenac sodium (VOLTAREN) 1 % GEL Apply 2 g topically 4 (four) times daily. 100 g 0  . furosemide (LASIX) 40 MG tablet Take 1.5 tablets (60 mg total) by mouth 2 (two) times daily. 90 tablet 11  . gabapentin (NEURONTIN) 300 MG capsule Take 300 mg by mouth 2 (two) times daily.   6  . glipiZIDE (GLUCOTROL) 10 MG tablet Take 20 mg by mouth 2 (two) times daily before a meal.    . metFORMIN (GLUCOPHAGE) 1000 MG tablet Take 1,000 mg by mouth 2 (two) times daily with a meal.    . methadone (DOLOPHINE) 10 MG tablet Take 10 mg by mouth daily.  0  . potassium chloride SA (K-DUR,KLOR-CON) 20 MEQ tablet Take 2 tablets (40 mEq total) by mouth daily. 30 tablet 0  . rivaroxaban (XARELTO) 20 MG TABS tablet Take 1 tablet (20 mg total) by mouth daily with supper. 30 tablet 0  . rosuvastatin (CRESTOR) 20 MG tablet Take 20 mg by mouth daily.    Marland Kitchen spironolactone (ALDACTONE) 25 MG tablet Take 1 tablet (25 mg total) by mouth daily. 30 tablet 11  . Vitamin D, Ergocalciferol, (DRISDOL) 50000 UNITS CAPS capsule Take 50,000 Units by mouth every 7 (seven) days. On Sunday.    . digoxin (LANOXIN) 0.125 MG tablet Take 1 tablet (0.125 mg total) by mouth daily. 30 tablet 11  . hydrALAZINE (APRESOLINE) 25 MG tablet Take 1 tablet (25 mg total) by mouth 3 (three) times daily. 90 tablet 11  . sacubitril-valsartan (ENTRESTO) 49-51 MG Take 1 tablet by mouth 2 (two) times daily. 60 tablet 11   No current facility-administered medications for this visit.    Allergies:   Review of patient's allergies indicates no known allergies.     Social History:  The patient  reports that he has been smoking Cigarettes.  He has been smoking about 0.05 packs per day. He has never used smokeless tobacco. He reports that he does not drink alcohol or use illicit drugs.   Family History:  The patient's family history includes Heart disease in his father and mother; Hypertension in his mother and sister.    ROS:   Please see the history of present illness.   Review of Systems  All other systems reviewed and are negative.    PHYSICAL EXAM: VS:  BP 170/80 mmHg  Pulse 97  Ht 6' (1.829 m)  Wt 266 lb 1.9 oz (120.711 kg)  BMI 36.08 kg/m2  SpO2 98%   Wt Readings from Last 3 Encounters:  09/12/15 266 lb 1.9 oz (120.711 kg)  08/27/15 265 lb 1.9 oz (120.258 kg)  08/02/15 271 lb 6.4 oz (123.106 kg)     GEN: Well nourished, well developed, in no acute distress HEENT: normal Neck: No JVD, no masses Cardiac:  Normal S1/S2, rapid regular rhythm; no murmur ,  no rubs or gallops, no edema   Respiratory:  clear to auscultation bilaterally, no wheezing, rhonchi or rales. GI: soft, nontender, nondistended, + BS MS: no deformity or atrophy Skin: warm and dry  Neuro:  CNs II-XII intact, Strength and sensation are intact Psych: Normal affect   EKG:  EKG is ordered today.  It demonstrates:   AFlutter, HR 129   Recent Labs: 02/21/2015: B Natriuretic Peptide 705.3* 02/23/2015: Magnesium 1.9 04/16/2015: ALT 17; TSH 1.97 08/02/2015: Hemoglobin 12.9*; Platelets 287.0; Pro B Natriuretic peptide (BNP) 363.0* 08/27/2015: BUN 13; Creat 0.91; Potassium 4.1; Sodium 135    Lipid Panel    Component Value Date/Time   CHOL 147 11/01/2014 0216   TRIG 134 11/01/2014 0216   HDL 48 11/01/2014 0216   CHOLHDL 3.1 11/01/2014 0216   VLDL 27 11/01/2014 0216   LDLCALC 72 11/01/2014 0216      ASSESSMENT AND PLAN:  1. Chronic systolic CHF (congestive heart failure):   He is NYHA 3.  Continue current dose of Lasix.  Volume appears stable.  Continue  beta-blocker, Spironolactone, Entresto 49/51.  BP uncontrolled.  Add Hydralazine 25 mg Three times a day.  Consider adding nitrates in the future.   2. NICM (nonischemic cardiomyopathy):  Question if this is related to tachycardia. Continue beta blocker, spironolactone, Entresto.   He is pending FU with EP to see if RFCA can be considered for his AFlutter as I am concerned his DCM may be tachycardia mediated.  Will need to plan FU echo once his rhythm is controlled to recheck EF.  If EF remains < 35%, consider ICD.     3. Coronary artery disease involving native coronary artery of native heart without angina pectoris:  Nonobstructive coronary artery disease by recent cardiac catheterization. He is not having any symptoms of angina. Continue aspirin, statin.  4. Atrial fibrillation and flutter:   He remains in AFlutter with RVR.  He is minimally symptomatic at this time.  He remains on Amiodarone.  I increased his dose last time in an effort to restore NSR.  There were no other options for rhythm control when seen in the hospital.  Referral back to Dr. Thompson Grayer is pending to see if RFCA can be offered.  Baseline PFTs pending.  I recommended proceeding with DCCV to restore NSR for now.  He has been on Xarelto without interruption for more than 3 weeks.  However, he would like to avoid this for now.    -  Therefore, I will start Digoxin 0.125 mg QD.    -  Return in 1 week to check ECG to see if HR improved.   5. Prolonged Q-T interval on ECG:   Avoid QT prolonging drugs.   6. Essential hypertension:  BP uncontrolled. I tried to increase his Entresto, but we have no samples of the highest dose. Therefore I have placed him on Hydralazine 25 mg Three times a day.   7. Hyperlipidemia:  Continue statin.       Medication Adjustments: Current medicines are reviewed at length with the patient today.  Concerns regarding medicines are as outlined above.  The following changes have been made:     Discontinued Medications   GABAPENTIN (NEURONTIN) 600 MG TABLET       SACUBITRIL-VALSARTAN (ENTRESTO) 49-51 MG    Take 1 tablet by mouth 2 (two) times daily.   New Prescriptions   DIGOXIN (LANOXIN) 0.125 MG TABLET    Take 1 tablet (0.125 mg total) by mouth daily.   HYDRALAZINE (APRESOLINE) 25 MG TABLET    Take 1 tablet (25 mg total) by mouth 3 (three) times daily.   SACUBITRIL-VALSARTAN (ENTRESTO) 49-51 MG    Take 1 tablet by mouth 2 (two) times daily.   Modified Medications   No medications on file   Labs/ tests ordered today include:   Orders Placed This Encounter  Procedures  . Basic Metabolic Panel (BMET)  . EKG 12-Lead  . PR OFFICE OUTPATIENT VISIT 5 MINUTES      Disposition:    FU with me in 2 weeks.  FU Dr. Casandra Doffing as planned. Refer back to Dr. Thompson Grayer for EP evaluation of AFlutter and NICM.    Signed, Versie Starks, MHS 09/12/2015 4:57 PM    [facility] [address], [place], [place]  [address] Phone: [phone]; Fax: [phone]

## 2015-09-12 ENCOUNTER — Encounter: Payer: Self-pay | Admitting: Physician Assistant

## 2015-09-12 ENCOUNTER — Ambulatory Visit (INDEPENDENT_AMBULATORY_CARE_PROVIDER_SITE_OTHER): Payer: Medicare Other | Admitting: Physician Assistant

## 2015-09-12 VITALS — BP 170/80 | HR 97 | Ht 72.0 in | Wt 266.1 lb

## 2015-09-12 DIAGNOSIS — I10 Essential (primary) hypertension: Secondary | ICD-10-CM

## 2015-09-12 DIAGNOSIS — I2510 Atherosclerotic heart disease of native coronary artery without angina pectoris: Secondary | ICD-10-CM

## 2015-09-12 DIAGNOSIS — E785 Hyperlipidemia, unspecified: Secondary | ICD-10-CM

## 2015-09-12 DIAGNOSIS — R9431 Abnormal electrocardiogram [ECG] [EKG]: Secondary | ICD-10-CM

## 2015-09-12 DIAGNOSIS — I4581 Long QT syndrome: Secondary | ICD-10-CM

## 2015-09-12 DIAGNOSIS — I5022 Chronic systolic (congestive) heart failure: Secondary | ICD-10-CM | POA: Diagnosis not present

## 2015-09-12 DIAGNOSIS — I4891 Unspecified atrial fibrillation: Secondary | ICD-10-CM | POA: Diagnosis not present

## 2015-09-12 DIAGNOSIS — I429 Cardiomyopathy, unspecified: Secondary | ICD-10-CM | POA: Diagnosis not present

## 2015-09-12 DIAGNOSIS — Z720 Tobacco use: Secondary | ICD-10-CM

## 2015-09-12 DIAGNOSIS — I4892 Unspecified atrial flutter: Secondary | ICD-10-CM

## 2015-09-12 DIAGNOSIS — I428 Other cardiomyopathies: Secondary | ICD-10-CM

## 2015-09-12 MED ORDER — HYDRALAZINE HCL 25 MG PO TABS
25.0000 mg | ORAL_TABLET | Freq: Three times a day (TID) | ORAL | Status: DC
Start: 2015-09-12 — End: 2016-09-16

## 2015-09-12 MED ORDER — SACUBITRIL-VALSARTAN 49-51 MG PO TABS: 1.0000 | ORAL_TABLET | Freq: Two times a day (BID) | ORAL | Status: DC

## 2015-09-12 MED ORDER — DIGOXIN 125 MCG PO TABS: 0.1250 mg | ORAL_TABLET | Freq: Every day | ORAL | Status: DC

## 2015-09-12 MED ORDER — SACUBITRIL-VALSARTAN 97-103 MG PO TABS: 1.0000 | ORAL_TABLET | Freq: Two times a day (BID) | ORAL | Status: DC

## 2015-09-12 NOTE — Patient Instructions (Addendum)
Medication Instructions:  1. CONTINUE ENTRESTO 49/51 TWICE DAILY; NEW RX SENT IN  2. START DIGOXIN 0.125 MG DAILY; RX SENT IN  3. START HYDRALAZINE 25 MG THREE TIMES DAILY; RX SENT IN  Labwork: TODAY BMET  Testing/Procedures: NONE  Follow-Up: 1. NURSE VISIT TO BE IN 1 WEEK FOR AN EKG DUE TO INCREASED DOSE OF DIGOXIN  2. KEEP YOUR APPT WITH DR. ALLRED 10/01/15  3. KEEP YOUR APPT WITH DR. VARANASI 10/09/15  Any Other Special Instructions Will Be Listed Below (If Applicable).     If you need a refill on your cardiac medications before your next appointment, please call your pharmacy.

## 2015-09-13 ENCOUNTER — Telehealth: Payer: Self-pay | Admitting: *Deleted

## 2015-09-13 LAB — BASIC METABOLIC PANEL
BUN: 12 mg/dL (ref 7–25)
CALCIUM: 9 mg/dL (ref 8.6–10.3)
CHLORIDE: 101 mmol/L (ref 98–110)
CO2: 23 mmol/L (ref 20–31)
CREATININE: 0.8 mg/dL (ref 0.70–1.25)
Glucose, Bld: 286 mg/dL — ABNORMAL HIGH (ref 65–99)
Potassium: 3.8 mmol/L (ref 3.5–5.3)
SODIUM: 138 mmol/L (ref 135–146)

## 2015-09-13 NOTE — Telephone Encounter (Signed)
Lmptcb x 2 for lab results.

## 2015-09-13 NOTE — Telephone Encounter (Signed)
Lmptcb for lab results and recommendations

## 2015-09-25 DIAGNOSIS — Z79899 Other long term (current) drug therapy: Secondary | ICD-10-CM | POA: Diagnosis not present

## 2015-09-25 DIAGNOSIS — G894 Chronic pain syndrome: Secondary | ICD-10-CM | POA: Diagnosis not present

## 2015-09-25 DIAGNOSIS — M47816 Spondylosis without myelopathy or radiculopathy, lumbar region: Secondary | ICD-10-CM | POA: Diagnosis not present

## 2015-09-25 DIAGNOSIS — M542 Cervicalgia: Secondary | ICD-10-CM | POA: Diagnosis not present

## 2015-09-25 DIAGNOSIS — Z79891 Long term (current) use of opiate analgesic: Secondary | ICD-10-CM | POA: Diagnosis not present

## 2015-10-01 ENCOUNTER — Encounter: Payer: Self-pay | Admitting: Internal Medicine

## 2015-10-01 ENCOUNTER — Ambulatory Visit (INDEPENDENT_AMBULATORY_CARE_PROVIDER_SITE_OTHER): Payer: Medicare Other | Admitting: Internal Medicine

## 2015-10-01 VITALS — BP 138/80 | HR 74 | Ht 72.0 in | Wt 269.0 lb

## 2015-10-01 DIAGNOSIS — I4892 Unspecified atrial flutter: Secondary | ICD-10-CM | POA: Diagnosis not present

## 2015-10-01 DIAGNOSIS — I2510 Atherosclerotic heart disease of native coronary artery without angina pectoris: Secondary | ICD-10-CM

## 2015-10-01 DIAGNOSIS — I4891 Unspecified atrial fibrillation: Secondary | ICD-10-CM | POA: Diagnosis not present

## 2015-10-01 DIAGNOSIS — I10 Essential (primary) hypertension: Secondary | ICD-10-CM

## 2015-10-01 LAB — BASIC METABOLIC PANEL
BUN: 10 mg/dL (ref 7–25)
CALCIUM: 8.6 mg/dL (ref 8.6–10.3)
CHLORIDE: 101 mmol/L (ref 98–110)
CO2: 25 mmol/L (ref 20–31)
CREATININE: 0.79 mg/dL (ref 0.70–1.25)
Glucose, Bld: 272 mg/dL — ABNORMAL HIGH (ref 65–99)
Potassium: 4.1 mmol/L (ref 3.5–5.3)
SODIUM: 137 mmol/L (ref 135–146)

## 2015-10-01 LAB — TSH: TSH: 1.293 u[IU]/mL (ref 0.350–4.500)

## 2015-10-01 LAB — CBC WITH DIFFERENTIAL/PLATELET
BASOS ABS: 0 10*3/uL (ref 0.0–0.1)
BASOS PCT: 0 % (ref 0–1)
EOS ABS: 0.2 10*3/uL (ref 0.0–0.7)
Eosinophils Relative: 2 % (ref 0–5)
HCT: 40.6 % (ref 39.0–52.0)
Hemoglobin: 13.1 g/dL (ref 13.0–17.0)
Lymphocytes Relative: 32 % (ref 12–46)
Lymphs Abs: 2.4 10*3/uL (ref 0.7–4.0)
MCH: 27.2 pg (ref 26.0–34.0)
MCHC: 32.3 g/dL (ref 30.0–36.0)
MCV: 84.4 fL (ref 78.0–100.0)
MPV: 10.2 fL (ref 8.6–12.4)
Monocytes Absolute: 0.6 10*3/uL (ref 0.1–1.0)
Monocytes Relative: 8 % (ref 3–12)
NEUTROS PCT: 58 % (ref 43–77)
Neutro Abs: 4.4 10*3/uL (ref 1.7–7.7)
PLATELETS: 361 10*3/uL (ref 150–400)
RBC: 4.81 MIL/uL (ref 4.22–5.81)
RDW: 14.1 % (ref 11.5–15.5)
WBC: 7.6 10*3/uL (ref 4.0–10.5)

## 2015-10-01 LAB — HEPATIC FUNCTION PANEL
ALT: 11 U/L (ref 9–46)
AST: 14 U/L (ref 10–35)
Albumin: 3.5 g/dL — ABNORMAL LOW (ref 3.6–5.1)
Alkaline Phosphatase: 141 U/L — ABNORMAL HIGH (ref 40–115)
BILIRUBIN DIRECT: 0.1 mg/dL (ref <=0.2)
Indirect Bilirubin: 0.2 mg/dL (ref 0.2–1.2)
TOTAL PROTEIN: 6.7 g/dL (ref 6.1–8.1)
Total Bilirubin: 0.3 mg/dL (ref 0.2–1.2)

## 2015-10-01 LAB — T4, FREE: FREE T4: 1.19 ng/dL (ref 0.80–1.80)

## 2015-10-01 NOTE — Patient Instructions (Signed)
Medication Instructions:  Your physician recommends that you continue on your current medications as directed. Please refer to the Current Medication list given to you today.   Labwork: Your physician recommends that you return for lab work today: Amiodarone level/liver/TSH/T4/BMP/CBC   Testing/Procedures: None ordered   Follow-Up: Your physician recommends that you schedule a follow-up appointment in: as scheduled with Dr [name] and as needed with Dr Rayann Heman   Any Other Special Instructions Will Be Listed Below (If Applicable).     If you need a refill on your cardiac medications before your next appointment, please call your pharmacy.

## 2015-10-01 NOTE — Progress Notes (Signed)
Electrophysiology Office Note   Date:  10/01/2015   ID:  Nicholas Caldwell, DOB 1946-01-19, MRN [id number]  PCP:  [name]  Cardiologist:  Dr [name] Primary Electrophysiologist: Thompson Grayer, MD    Chief Complaint  Patient presents with  . Atrial Fibrillation  . Atrial Flutter     History of Present Illness: Nicholas Caldwell is a 69 y.o. male who presents today for electrophysiology evaluation.   The patient was seen by me 02/22/15 (my note reviewed) during his hospitalization.  He has had persistent afib and atrial flutter in the setting of advanced atriopathy/ severe LA enlargement, and nonischemic CM.  He also has morbid obesity.  He has been treated with amiodarone.  Today, he presents in sinus rhythm.  He feels well at this time.  He feels that CHF/ SOB has improved with entresto.  Today, he denies symptoms of palpitations, chest pain, shortness of breath, orthopnea, PND,  claudication, dizziness, presyncope, syncope, bleeding, or neurologic sequela. The patient is tolerating medications without difficulties and is otherwise without complaint today.    Past Medical History  Diagnosis Date  . Hypertension   . Diabetes mellitus without complication ([place])   . Hypercholesteremia   . Chronic systolic CHF (congestive heart failure) ([place])   . NICM (nonischemic cardiomyopathy) ([place])     a. Echo 4/16:  EF 25-30%, diffuse HK, mild MR, severe LAE  . CAD (coronary artery disease)     a. LHC 1/16:  mLAD diffuse disease, pLCx mild disease, dLCx with disease but too small for PCI, RCA ok, EF 25-30%  . Tobacco abuse   . Atrial flutter ([place])     a. recurrent AFlutter with RVR;  b. Amiodarone Rx started 4/16  . Prolonged Q-T interval on ECG   . COPD (chronic obstructive pulmonary disease) ([place])   . Persistent atrial fibrillation [facility])    Past Surgical History  Procedure Laterality Date  . Left and right heart catheterization with coronary angiogram N/A 11/06/2014    Procedure:  LEFT AND RIGHT HEART CATHETERIZATION WITH CORONARY ANGIOGRAM;  Surgeon: Jettie Booze, MD;  Location: [facility] CATH LAB;  Service: Cardiovascular;  Laterality: N/A;     Current Outpatient Prescriptions  Medication Sig Dispense Refill  . albuterol (PROVENTIL HFA;VENTOLIN HFA) 108 (90 BASE) MCG/ACT inhaler Inhale 2 puffs into the lungs every 6 (six) hours as needed for wheezing or shortness of breath. 1 Inhaler 2  . amiodarone (PACERONE) 200 MG tablet Take 200 mg by mouth 2 (two) times daily.    . carvedilol (COREG) 25 MG tablet Take 1 tablet (25 mg total) by mouth 2 (two) times daily with a meal. 60 tablet 11  . diclofenac sodium (VOLTAREN) 1 % GEL Apply 2 g topically 4 (four) times daily. 100 g 0  . digoxin (LANOXIN) 0.125 MG tablet Take 1 tablet (0.125 mg total) by mouth daily. 30 tablet 11  . furosemide (LASIX) 40 MG tablet Take 1.5 tablets (60 mg total) by mouth 2 (two) times daily. 90 tablet 11  . gabapentin (NEURONTIN) 300 MG capsule Take 300 mg by mouth 2 (two) times daily.   6  . glipiZIDE (GLUCOTROL) 10 MG tablet Take 20 mg by mouth 2 (two) times daily before a meal.    . hydrALAZINE (APRESOLINE) 25 MG tablet Take 1 tablet (25 mg total) by mouth 3 (three) times daily. 90 tablet 11  . metFORMIN (GLUCOPHAGE) 1000 MG tablet Take 1,000 mg by mouth 2 (two) times daily with a meal.    .  methadone (DOLOPHINE) 10 MG tablet Take 10 mg by mouth daily.  0  . potassium chloride SA (K-DUR,KLOR-CON) 20 MEQ tablet Take 2 tablets (40 mEq total) by mouth daily. 30 tablet 0  . rivaroxaban (XARELTO) 20 MG TABS tablet Take 1 tablet (20 mg total) by mouth daily with supper. 30 tablet 0  . rosuvastatin (CRESTOR) 20 MG tablet Take 20 mg by mouth daily.    . sacubitril-valsartan (ENTRESTO) 49-51 MG Take 1 tablet by mouth 2 (two) times daily. 60 tablet 11  . spironolactone (ALDACTONE) 25 MG tablet Take 1 tablet (25 mg total) by mouth daily. 30 tablet 11  . Vitamin D, Ergocalciferol, (DRISDOL) 50000 UNITS CAPS  capsule Take 50,000 Units by mouth every 7 (seven) days. On Sunday.     No current facility-administered medications for this visit.    Allergies:   Review of patient's allergies indicates no known allergies.   Social History:  The patient  reports that he has been smoking Cigarettes.  He has been smoking about 0.05 packs per day. He has never used smokeless tobacco. He reports that he does not drink alcohol or use illicit drugs.   Family History:  The patient's  family history includes Heart disease in his father and mother; Hypertension in his mother and sister.    ROS:  Please see the history of present illness.   All other systems are reviewed and negative.    PHYSICAL EXAM: VS:  BP 138/80 mmHg  Pulse 74  Ht 6' (1.829 m)  Wt 269 lb (122.018 kg)  BMI 36.48 kg/m2 , BMI Body mass index is 36.48 kg/(m^2). GEN: morbidly obese, in no acute distress HEENT: normal Neck: no JVD, carotid bruits, or masses Cardiac: RRR; no murmurs, rubs, or gallops,+ dependant edema  Respiratory:  clear to auscultation bilaterally, normal work of breathing GI: soft, nontender, nondistended, + BS MS: no deformity or atrophy Skin: warm and dry  Neuro:  Strength and sensation are intact Psych: euthymic mood, full affect  EKG:  EKG is ordered today. The ekg ordered today shows sinus rhythm with p mitrale   Recent Labs: 02/21/2015: B Natriuretic Peptide 705.3* 02/23/2015: Magnesium 1.9 04/16/2015: ALT 17; TSH 1.97 08/02/2015: Hemoglobin 12.9*; Platelets 287.0; Pro B Natriuretic peptide (BNP) 363.0* 09/12/2015: BUN 12; Creat 0.80; Potassium 3.8; Sodium 138    Lipid Panel     Component Value Date/Time   CHOL 147 11/01/2014 0216   TRIG 134 11/01/2014 0216   HDL 48 11/01/2014 0216   CHOLHDL 3.1 11/01/2014 0216   VLDL 27 11/01/2014 0216   LDLCALC 72 11/01/2014 0216     Wt Readings from Last 3 Encounters:  10/01/15 269 lb (122.018 kg)  09/12/15 266 lb 1.9 oz (120.711 kg)  08/27/15 265 lb 1.9 oz  (120.258 kg)      Other studies Reviewed: Additional studies/ records that were reviewed today include: Leta Speller notes, hospital records, echo  Review of the above records today demonstrates: EF 25-30%, mild MR, severe LA enlargement   ASSESSMENT AND PLAN:  1.  Persistent afib/ atrial flutter Currently in sinus rhythm with amiodarone 26m BID No changes today He is clear that he would like to avoid procedures. His anticipated success with ablation is very low and risks are high.  I would favor medical therapy long term. Check amiodarone level today If supratherapeutic, would decrease dose when he sees Dr VIrish Lackin several weeks.  If therapeutic, may consider keeping on amiodarone 2062mBID with close clinical follow-up by  cardiology Check lfts/tfts today bmet today  2. Nonischemic CM/ chronic systolic dysfunction Medical therapy per Dr [name]  3. Morbid obesity The importance of lifestyle modification was stressed with the patient today  4. HTN Stable No change required today   Follow-up with Dr [name] as scheduled I will see as needed going forward  Current medicines are reviewed at length with the patient today.   The patient does not have concerns regarding his medicines.  The following changes were made today:  none  Labs/ tests ordered today include:  Orders Placed This Encounter  Procedures  . Amiodarone level  . Hepatic function panel  . TSH  . T4, free  . Basic metabolic panel  . CBC with Differential  . EKG 12-Lead     Signed, Thompson Grayer, MD  10/01/2015 7:13 PM     [place] [place] [address] [phone] (office) [phone] (fax)

## 2015-10-02 LAB — AMIODARONE LEVEL

## 2015-10-09 ENCOUNTER — Ambulatory Visit (INDEPENDENT_AMBULATORY_CARE_PROVIDER_SITE_OTHER): Payer: Medicare Other | Admitting: Interventional Cardiology

## 2015-10-09 ENCOUNTER — Encounter: Payer: Self-pay | Admitting: Interventional Cardiology

## 2015-10-09 VITALS — BP 142/60 | HR 68 | Ht 72.0 in | Wt 269.0 lb

## 2015-10-09 DIAGNOSIS — R9431 Abnormal electrocardiogram [ECG] [EKG]: Secondary | ICD-10-CM | POA: Insufficient documentation

## 2015-10-09 DIAGNOSIS — I10 Essential (primary) hypertension: Secondary | ICD-10-CM | POA: Diagnosis not present

## 2015-10-09 DIAGNOSIS — I480 Paroxysmal atrial fibrillation: Secondary | ICD-10-CM

## 2015-10-09 DIAGNOSIS — I428 Other cardiomyopathies: Secondary | ICD-10-CM | POA: Insufficient documentation

## 2015-10-09 DIAGNOSIS — I429 Cardiomyopathy, unspecified: Secondary | ICD-10-CM

## 2015-10-09 DIAGNOSIS — I4581 Long QT syndrome: Secondary | ICD-10-CM | POA: Diagnosis not present

## 2015-10-09 DIAGNOSIS — I2510 Atherosclerotic heart disease of native coronary artery without angina pectoris: Secondary | ICD-10-CM

## 2015-10-09 MED ORDER — ISOSORBIDE MONONITRATE ER 30 MG PO TB24: 30.0000 mg | ORAL_TABLET | Freq: Every day | ORAL | Status: DC

## 2015-10-09 NOTE — Patient Instructions (Signed)
**Note De-Identified  Obfuscation** Medication Instructions:  Same-no changes  Labwork: None  Testing/Procedures: None  Follow-Up: Your physician recommends that you schedule a follow-up appointment in: 3 months      If you need a refill on your cardiac medications before your next appointment, please call your pharmacy.

## 2015-10-09 NOTE — Progress Notes (Signed)
Patient ID: Nicholas Caldwell, male   DOB: 1946-04-05, 69 y.o.   MRN: [id number]     Cardiology Office Note   Date:  10/09/2015   ID:  Nicholas Caldwell, DOB Apr 30, 1946, MRN [id number]  PCP:  M[place]EN[place],O'LAF, PA-C    No chief complaint on file. f/u AFib, LV dysfunction   Wt Readings from Last 3 Encounters:  10/09/15 269 lb (122.018 kg)  10/01/15 269 lb (122.018 kg)  09/12/15 266 lb 1.9 oz (120.711 kg)       History of Present Illness: Nicholas Caldwell is a 69 y.o. male   with a hx of nonischemic cardiomyopathy dx 6/16, systolic HF, HTN, HL, diabetes, tobacco abuse. Cardiac catheterization January 2016 demonstrated mild nonobstructive CAD.   Admitted 0/73 with a/c systolic HF and elevated Troponin levels 2/2 demand ischemia in the setting of atrial fibrillation/flutter with RVR. CHADS2-VASc=4. He was placed on Xarelto for anticoagulation. He was seen by EP and was not felt to be a candidate for flecainide secondary cardiomyopathy. He was not felt to be a candidate for Tikosyn given his prolonged QT. Success of ablation would be limited due to left atrial size. He was screened for Genetic AF and was not felt to be a good candidate. It was ultimately decided to place him on amiodarone for rhythm control. Plan was to follow him in the AF Clinic but he was lost to FU. He has felt better since starting Entresto.  He has had intermittent atrial flutter as well but did not require DCCV.  He was seen by Dr. Rayann Heman last week and found to be in NSR.  Amiodarone level was low.   No palpitations.  Now sleeping on 2 pillows.  Overall, he feels very well.    Past Medical History  Diagnosis Date  . Hypertension   . Diabetes mellitus without complication ([place])   . Hypercholesteremia   . Chronic systolic CHF (congestive heart failure) ([place])   . NICM (nonischemic cardiomyopathy) ([place])     a. Echo 4/16:  EF 25-30%, diffuse HK, mild MR, severe LAE  . CAD (coronary artery disease)     a. LHC 1/16:  mLAD  diffuse disease, pLCx mild disease, dLCx with disease but too small for PCI, RCA ok, EF 25-30%  . Tobacco abuse   . Atrial flutter ([place])     a. recurrent AFlutter with RVR;  b. Amiodarone Rx started 4/16  . Prolonged Q-T interval on ECG   . COPD (chronic obstructive pulmonary disease) ([place])   . Persistent atrial fibrillation [facility])     Past Surgical History  Procedure Laterality Date  . Left and right heart catheterization with coronary angiogram N/A 11/06/2014    Procedure: LEFT AND RIGHT HEART CATHETERIZATION WITH CORONARY ANGIOGRAM;  Surgeon: Jettie Booze, MD;  Location: [facility] CATH LAB;  Service: Cardiovascular;  Laterality: N/A;     Current Outpatient Prescriptions  Medication Sig Dispense Refill  . albuterol (PROVENTIL HFA;VENTOLIN HFA) 108 (90 BASE) MCG/ACT inhaler Inhale 2 puffs into the lungs every 6 (six) hours as needed for wheezing or shortness of breath. 1 Inhaler 2  . amiodarone (PACERONE) 200 MG tablet Take 200 mg by mouth 2 (two) times daily.    . carvedilol (COREG) 25 MG tablet Take 1 tablet (25 mg total) by mouth 2 (two) times daily with a meal. 60 tablet 11  . diclofenac sodium (VOLTAREN) 1 % GEL Apply 2 g topically 4 (four) times daily. 100 g 0  . digoxin (LANOXIN) 0.125 MG tablet Take  1 tablet (0.125 mg total) by mouth daily. 30 tablet 11  . furosemide (LASIX) 40 MG tablet Take 1.5 tablets (60 mg total) by mouth 2 (two) times daily. 90 tablet 11  . gabapentin (NEURONTIN) 300 MG capsule Take 300 mg by mouth 2 (two) times daily.   6  . glipiZIDE (GLUCOTROL) 10 MG tablet Take 20 mg by mouth 2 (two) times daily before a meal.    . hydrALAZINE (APRESOLINE) 25 MG tablet Take 1 tablet (25 mg total) by mouth 3 (three) times daily. 90 tablet 11  . metFORMIN (GLUCOPHAGE) 1000 MG tablet Take 1,000 mg by mouth 2 (two) times daily with a meal.    . methadone (DOLOPHINE) 10 MG tablet Take 10 mg by mouth daily.  0  . potassium chloride SA (K-DUR,KLOR-CON) 20 MEQ tablet Take 2  tablets (40 mEq total) by mouth daily. 30 tablet 0  . rivaroxaban (XARELTO) 20 MG TABS tablet Take 1 tablet (20 mg total) by mouth daily with supper. 30 tablet 0  . rosuvastatin (CRESTOR) 20 MG tablet Take 20 mg by mouth daily.    . sacubitril-valsartan (ENTRESTO) 49-51 MG Take 1 tablet by mouth 2 (two) times daily. 60 tablet 11  . spironolactone (ALDACTONE) 25 MG tablet Take 1 tablet (25 mg total) by mouth daily. 30 tablet 11  . Vitamin D, Ergocalciferol, (DRISDOL) 50000 UNITS CAPS capsule Take 50,000 Units by mouth every 7 (seven) days. On Sunday.     No current facility-administered medications for this visit.    Allergies:   Review of patient's allergies indicates no known allergies.    Social History:  The patient  reports that he quit smoking about 5 weeks ago. His smoking use included Cigarettes. He smoked 0.05 packs per day. He has never used smokeless tobacco. He reports that he does not drink alcohol or use illicit drugs.   Family History:  The patient's family history includes Heart disease in his father and mother; Heart failure in his mother; Hypertension in his mother and sister. There is no history of Heart attack or Stroke.    ROS:  Please see the history of present illness.   Otherwise, review of systems are positive for improved breathing; hip pain and back pain limit walking-uses a walker.   All other systems are reviewed and negative.    PHYSICAL EXAM: VS:  BP 142/60 mmHg  Pulse 68  Ht 6' (1.829 m)  Wt 269 lb (122.018 kg)  BMI 36.48 kg/m2  SpO2 96% , BMI Body mass index is 36.48 kg/(m^2). GEN: Well nourished, well developed, in no acute distress HEENT: normal Neck: no JVD, carotid bruits, or masses Cardiac: RRR; no murmurs, rubs, or gallops,no edema  Respiratory:  clear to auscultation bilaterally, normal work of breathing GI: soft, nontender, nondistended, + BS MS: no deformity or atrophy Skin: warm and dry, no rash Neuro:  Strength and sensation are  intact Psych: euthymic mood, full affect   EKG:   The ekg form 11/28 shows NSR   Recent Labs: 02/21/2015: B Natriuretic Peptide 705.3* 02/23/2015: Magnesium 1.9 08/02/2015: Pro B Natriuretic peptide (BNP) 363.0* 10/01/2015: ALT 11; BUN 10; Creat 0.79; Hemoglobin 13.1; Platelets 361; Potassium 4.1; Sodium 137; TSH 1.293   Lipid Panel    Component Value Date/Time   CHOL 147 11/01/2014 0216   TRIG 134 11/01/2014 0216   HDL 48 11/01/2014 0216   CHOLHDL 3.1 11/01/2014 0216   VLDL 27 11/01/2014 0216   LDLCALC 72 11/01/2014 0216  Other studies Reviewed: Additional studies/ records that were reviewed today with results demonstrating: ECG as noted above.   ASSESSMENT AND PLAN:  1. Nonischemic cardiomyopathy: stable.  Euvolemic.  Start Imdur 30 mg daily to help improve LV function.  COntinue entresto, aldactone , carvedilol.   Will defer to electrophysiology as to whether or not he would be a candidate for a defibrillator. 2. AFib: Now in NSR.  Decrease amiodarone to 200 mg daily.  Low Amio level.  Xarelto for stroke prevention. 3. QT prolongation noted in the past.  Avoid QT prolonging drugs. Not a candidate for Tikosyn. 4. HTN: Improved since hydralazine was added.  BP checked at home by the patient nearly daily.   Current medicines are reviewed at length with the patient today.  The patient concerns regarding his medicines were addressed.  The following changes have been made:  Add Imdur, decrease Amiodarone  Labs/ tests ordered today include:  No orders of the defined types were placed in this encounter.    Recommend 150 minutes/week of aerobic exercise Low fat, low carb, high fiber diet recommended  Disposition:   FU in 3 months with Oneal Deputy., MD  10/09/2015 10:53 AM    [facility] [place], [place], [place]  [address] Phone: [phone]; Fax: [phone]

## 2015-10-22 DIAGNOSIS — M792 Neuralgia and neuritis, unspecified: Secondary | ICD-10-CM | POA: Diagnosis not present

## 2015-10-22 DIAGNOSIS — Z79899 Other long term (current) drug therapy: Secondary | ICD-10-CM | POA: Diagnosis not present

## 2015-10-22 DIAGNOSIS — M545 Low back pain: Secondary | ICD-10-CM | POA: Diagnosis not present

## 2015-10-22 DIAGNOSIS — G894 Chronic pain syndrome: Secondary | ICD-10-CM | POA: Diagnosis not present

## 2015-11-21 DIAGNOSIS — Z79899 Other long term (current) drug therapy: Secondary | ICD-10-CM | POA: Diagnosis not present

## 2015-11-21 DIAGNOSIS — M25559 Pain in unspecified hip: Secondary | ICD-10-CM | POA: Diagnosis not present

## 2015-11-21 DIAGNOSIS — G894 Chronic pain syndrome: Secondary | ICD-10-CM | POA: Diagnosis not present

## 2015-11-21 DIAGNOSIS — M545 Low back pain: Secondary | ICD-10-CM | POA: Diagnosis not present

## 2015-12-07 ENCOUNTER — Telehealth: Payer: Self-pay | Admitting: Interventional Cardiology

## 2015-12-07 NOTE — Telephone Encounter (Signed)
Pt calling requesting samples of entresto 49-51 mg tablets. I informed the pt that I would be leaving a week supply of the medication up at the front desk for pt to pick up and I also gave pt the number to [facility], patient assist network, [phone]. I advised the pt that if he needed anything else to give our office a call back. Pt verbalized understanding.

## 2015-12-19 DIAGNOSIS — M25559 Pain in unspecified hip: Secondary | ICD-10-CM | POA: Diagnosis not present

## 2015-12-19 DIAGNOSIS — G894 Chronic pain syndrome: Secondary | ICD-10-CM | POA: Diagnosis not present

## 2015-12-19 DIAGNOSIS — Z79899 Other long term (current) drug therapy: Secondary | ICD-10-CM | POA: Diagnosis not present

## 2015-12-19 DIAGNOSIS — M545 Low back pain: Secondary | ICD-10-CM | POA: Diagnosis not present

## 2016-01-01 ENCOUNTER — Encounter: Payer: Self-pay | Admitting: Interventional Cardiology

## 2016-01-06 NOTE — Progress Notes (Signed)
Cardiology Office Note:    Date:  01/06/2016   ID:  Nicholas Caldwell, DOB 06-18-1946, MRN [id number]  PCP:  Sofie Rower, PA-C  Cardiologist:  Dr. Casandra Doffing [name], PA-C)  Electrophysiologist:  Dr. Thompson Grayer   Chief Complaint  Patient presents with  . Congestive Heart Failure    follow up  . Atrial Fibrillation    follow up    History of Present Illness:     Nicholas Caldwell is a 70 y.o. male with a hx of nonischemic cardiomyopathy dx 7/37, systolic HF, HTN, HL, diabetes, tobacco abuse. Cardiac catheterization January 2016 demonstrated mild nonobstructive CAD.   Admitted 1/06 with a/c systolic HF and elevated Troponin levels 2/2 demand ischemia in the setting of atrial fibrillation/flutter with RVR. CHADS2-VASc=4. He was placed on Xarelto for anticoagulation. He was seen by EP and was not felt to be a candidate for flecainide secondary cardiomyopathy. He was not felt to be a candidate for Tikosyn given his prolonged QT. Success of ablation would be limited due to left atrial size. He was screened for Genetic AF and was not felt to be a good candidate. It was ultimately decided to place him on amiodarone for rhythm control. Plan was to follow him in the AF Clinic but he was lost to FU.   He has had transient episodes of AFlutter noted in the office in the past. Last seen by me 08/27/15. He was back in AFlutter with RVR. I adjusted his Amiodarone. I changed his ACE inhibitor to Praxair (ARNI). I have arranged FU with EP to see if he can be considered for RFCA as his arrhythmia may be contributing to his DCM. He returns for further management of his CHF as well as to reassess his rhythm with an eye towards DCCV if he remains in AFlutter. He is still in AFlutter with RVR. He denies palpitations. He notes DOE but this is overall improved. He denies syncope. He used to sleep on 4 pillows and now sleeps on 2. Denies PND. Denies LE edema.    Past Medical History  Diagnosis  Date  . Hypertension   . Diabetes mellitus without complication ([place])   . Hypercholesteremia   . Chronic systolic CHF (congestive heart failure) ([place])   . NICM (nonischemic cardiomyopathy) ([place])     a. Echo 4/16:  EF 25-30%, diffuse HK, mild MR, severe LAE  . CAD (coronary artery disease)     a. LHC 1/16:  mLAD diffuse disease, pLCx mild disease, dLCx with disease but too small for PCI, RCA ok, EF 25-30%  . Tobacco abuse   . Atrial flutter ([place])     a. recurrent AFlutter with RVR;  b. Amiodarone Rx started 4/16  . Prolonged Q-T interval on ECG   . COPD (chronic obstructive pulmonary disease) ([place])   . Persistent atrial fibrillation [facility])     Past Surgical History  Procedure Laterality Date  . Left and right heart catheterization with coronary angiogram N/A 11/06/2014    Procedure: LEFT AND RIGHT HEART CATHETERIZATION WITH CORONARY ANGIOGRAM;  Surgeon: Jettie Booze, MD;  Location: [facility] CATH LAB;  Service: Cardiovascular;  Laterality: N/A;    Current Medications: Outpatient Prescriptions Prior to Visit  Medication Sig Dispense Refill  . albuterol (PROVENTIL HFA;VENTOLIN HFA) 108 (90 BASE) MCG/ACT inhaler Inhale 2 puffs into the lungs every 6 (six) hours as needed for wheezing or shortness of breath. 1 Inhaler 2  . amiodarone (PACERONE) 200 MG tablet Take 200 mg by mouth daily.    Marland Kitchen  carvedilol (COREG) 25 MG tablet Take 1 tablet (25 mg total) by mouth 2 (two) times daily with a meal. 60 tablet 11  . diclofenac sodium (VOLTAREN) 1 % GEL Apply 2 g topically 4 (four) times daily. 100 g 0  . digoxin (LANOXIN) 0.125 MG tablet Take 1 tablet (0.125 mg total) by mouth daily. 30 tablet 11  . furosemide (LASIX) 40 MG tablet Take 1.5 tablets (60 mg total) by mouth 2 (two) times daily. 90 tablet 11  . gabapentin (NEURONTIN) 300 MG capsule Take 300 mg by mouth 2 (two) times daily.   6  . glipiZIDE (GLUCOTROL) 10 MG tablet Take 20 mg by mouth 2 (two) times daily before a meal.    . hydrALAZINE  (APRESOLINE) 25 MG tablet Take 1 tablet (25 mg total) by mouth 3 (three) times daily. 90 tablet 11  . isosorbide mononitrate (IMDUR) 30 MG 24 hr tablet Take 1 tablet (30 mg total) by mouth daily. 30 tablet 3  . metFORMIN (GLUCOPHAGE) 1000 MG tablet Take 1,000 mg by mouth 2 (two) times daily with a meal.    . methadone (DOLOPHINE) 10 MG tablet Take 10 mg by mouth daily.  0  . potassium chloride SA (K-DUR,KLOR-CON) 20 MEQ tablet Take 2 tablets (40 mEq total) by mouth daily. 30 tablet 0  . rivaroxaban (XARELTO) 20 MG TABS tablet Take 1 tablet (20 mg total) by mouth daily with supper. 30 tablet 0  . rosuvastatin (CRESTOR) 20 MG tablet Take 20 mg by mouth daily.    . sacubitril-valsartan (ENTRESTO) 49-51 MG Take 1 tablet by mouth 2 (two) times daily. 60 tablet 11  . spironolactone (ALDACTONE) 25 MG tablet Take 1 tablet (25 mg total) by mouth daily. 30 tablet 11  . Vitamin D, Ergocalciferol, (DRISDOL) 50000 UNITS CAPS capsule Take 50,000 Units by mouth every 7 (seven) days. On Sunday.     No facility-administered medications prior to visit.     Allergies:   Review of patient's allergies indicates no known allergies.   Social History   Social History  . Marital Status: Single    Spouse Name: N/A  . Number of Children: N/A  . Years of Education: N/A   Social History Main Topics  . Smoking status: Former Smoker -- 0.05 packs/day    Types: Cigarettes    Quit date: 08/30/2015  . Smokeless tobacco: Never Used  . Alcohol Use: No  . Drug Use: No  . Sexual Activity: Not on file   Other Topics Concern  . Not on file   Social History Narrative     Family History:  The patient's family history includes Heart disease in his father and mother; Heart failure in his mother; Hypertension in his mother and sister. There is no history of Heart attack or Stroke.   ROS:   Please see the history of present illness.    ROS All other systems reviewed and are negative.   Physical Exam:    VS:   There were no vitals taken for this visit.   GEN: Well nourished, well developed, in no acute distress HEENT: normal Neck: no JVD, no masses Cardiac: Normal S1/S2, RRR; no murmurs, rubs, or gallops, no edema;   carotid bruits,   Respiratory:  clear to auscultation bilaterally; no wheezing, rhonchi or rales GI: soft, nontender, nondistended, + BS MS: no deformity or atrophy Skin: warm and dry, no rash Neuro:  Bilateral strength equal, no focal deficits  Psych: Alert and oriented x 3, normal affect  Wt Readings from Last 3 Encounters:  10/09/15 269 lb (122.018 kg)  10/01/15 269 lb (122.018 kg)  09/12/15 266 lb 1.9 oz (120.711 kg)      Studies/Labs Reviewed:     EKG:  EKG is  ordered today.  The ekg ordered today demonstrates   Recent Labs: 02/21/2015: B Natriuretic Peptide 705.3* 02/23/2015: Magnesium 1.9 08/02/2015: Pro B Natriuretic peptide (BNP) 363.0* 10/01/2015: ALT 11; BUN 10; Creat 0.79; Hemoglobin 13.1; Platelets 361; Potassium 4.1; Sodium 137; TSH 1.293   Recent Lipid Panel    Component Value Date/Time   CHOL 147 11/01/2014 0216   TRIG 134 11/01/2014 0216   HDL 48 11/01/2014 0216   CHOLHDL 3.1 11/01/2014 0216   VLDL 27 11/01/2014 0216   LDLCALC 72 11/01/2014 0216    Additional studies/ records that were reviewed today include:   Echo 02/22/15 EF 25-30%, diffuse HK, mild MR, severe LAE  LHC 11/06/14 LM: Patent LAD: Mid vessel moderate diffuse disease LCx: Mild proximal disease. Distal disease - vessel too small for PCI RCA: Patent EF 25-30%, LVEDP 26 Conclusion: Mild diffuse non-obstructive CAD; NICM  Myoview 11/02/14 IMPRESSION: 1. Inferoapical scar/infarction. No findings for myocardial ischemia. 2. Mild global hypokinesis and no contraction or wall thickening in the area of scar. 3. Left ventricular ejection fraction 32% 4. High-risk stress test findings*.   ASSESSMENT:     No diagnosis found.  PLAN:     In order of problems listed  above:  1. Chronic systolic CHF (congestive heart failure): He is NYHA 3. Continue current dose of Lasix. Volume appears stable. Continue beta-blocker, Spironolactone, Entresto 49/51. BP uncontrolled. Add Hydralazine 25 mg Three times a day. Consider adding nitrates in the future.   2. NICM (nonischemic cardiomyopathy): Question if this is related to tachycardia. Continue beta blocker, spironolactone, Entresto. He is pending FU with EP to see if RFCA can be considered for his AFlutter as I am concerned his DCM may be tachycardia mediated. Will need to plan FU echo once his rhythm is controlled to recheck EF. If EF remains < 35%, consider ICD.   3. Coronary artery disease involving native coronary artery of native heart without angina pectoris: Nonobstructive coronary artery disease by recent cardiac catheterization. He is not having any symptoms of angina. Continue aspirin, statin.  4. Atrial fibrillation and flutter: He remains in AFlutter with RVR. He is minimally symptomatic at this time. He remains on Amiodarone. I increased his dose last time in an effort to restore NSR. There were no other options for rhythm control when seen in the hospital. Referral back to Dr. Thompson Grayer is pending to see if RFCA can be offered. Baseline PFTs pending. I recommended proceeding with DCCV to restore NSR for now. He has been on Xarelto without interruption for more than 3 weeks. However, he would like to avoid this for now.  - Therefore, I will start Digoxin 0.125 mg QD.  - Return in 1 week to check ECG to see if HR improved.   5. Prolonged Q-T interval on ECG: Avoid QT prolonging drugs.   6. Essential hypertension: BP uncontrolled. I tried to increase his Entresto, but we have no samples of the highest dose. Therefore I have placed him on Hydralazine 25 mg Three times a day.   7. Hyperlipidemia: Continue statin.    Medication Adjustments/Labs and  Tests Ordered: Current medicines are reviewed at length with the patient today.  Concerns regarding medicines are outlined above.  Medication changes, Labs and Tests ordered  today are outlined in the Patient Instructions noted below. There are no Patient Instructions on file for this visit. Signed, [name], PA-C  01/06/2016 9:24 PM    [facility] [address], [place], [place]  [address] Phone: [phone]; Fax: [phone]     This encounter was created in error - please disregard.

## 2016-01-07 ENCOUNTER — Encounter: Payer: Medicare Other | Admitting: Physician Assistant

## 2016-01-16 DIAGNOSIS — M545 Low back pain: Secondary | ICD-10-CM | POA: Diagnosis not present

## 2016-01-16 DIAGNOSIS — M25559 Pain in unspecified hip: Secondary | ICD-10-CM | POA: Diagnosis not present

## 2016-01-16 DIAGNOSIS — Z79899 Other long term (current) drug therapy: Secondary | ICD-10-CM | POA: Diagnosis not present

## 2016-01-16 DIAGNOSIS — G894 Chronic pain syndrome: Secondary | ICD-10-CM | POA: Diagnosis not present

## 2016-01-16 DIAGNOSIS — M792 Neuralgia and neuritis, unspecified: Secondary | ICD-10-CM | POA: Diagnosis not present

## 2016-01-20 NOTE — Progress Notes (Signed)
Cardiology Office Note:    Date:  01/21/2016   ID:  Nicholas Caldwell, DOB [birth year], MRN [id number]  PCP:  [facility]  Cardiologist:  Dr. Casandra Doffing [facility], PA-C)  Electrophysiologist:  Dr. Thompson Grayer   Chief Complaint  Patient presents with  . Congestive Heart Failure    follow up    History of Present Illness:     Nicholas Caldwell is a 70 y.o. male with a hx of nonischemic cardiomyopathy dx 8/17, systolic HF, HTN, HL, diabetes, tobacco abuse. Cardiac catheterization January 2016 demonstrated mild nonobstructive CAD.   Admitted 7/11 with a/c systolic HF and elevated Troponin levels 2/2 demand ischemia in the setting of atrial fibrillation/flutter with RVR. CHADS2-VASc=4. He was placed on Xarelto for anticoagulation. He was seen by EP and was not felt to be a candidate for flecainide secondary cardiomyopathy. He was not felt to be a candidate for Tikosyn given his prolonged QT. Success of ablation would be limited due to left atrial size. He was screened for Genetic AF and was not felt to be a good candidate. It was ultimately decided to place him on amiodarone for rhythm control. Plan was to follow him in the AF Clinic but he was lost to FU.   He has had transient episodes of AFlutter noted in the office in the past. I adjusted his Amiodarone. I had him FU with EP to see if he can be considered for RFCA as his arrhythmia may be contributing to his DCM. He saw Dr. Rayann Heman 10/01/15. Apparently he declined ICD at that time. Last seen by Dr. [name] 12/16. Isosorbide was added to his medical regimen. He returns for follow-up.  Recently, he has had increasing urinary frequency. His PCP has treated him for a UTI. He's not noticed much of a difference in his urinary frequency since starting on antibiotics. He denies dysuria. He has chronic dyspnea and exertion. He denies any changes. He denies PND or pedal edema. He does sleep on 2 pillows without change. Denies any significant  increase in abdominal girth. Denies syncope.   Past Medical History  Diagnosis Date  . Hypertension   . Diabetes mellitus without complication ([place])   . Hypercholesteremia   . Chronic systolic CHF (congestive heart failure) ([place])   . NICM (nonischemic cardiomyopathy) ([place])     a. Echo 4/16:  EF 25-30%, diffuse HK, mild MR, severe LAE  . CAD (coronary artery disease)     a. LHC 1/16:  mLAD diffuse disease, pLCx mild disease, dLCx with disease but too small for PCI, RCA ok, EF 25-30%  . Tobacco abuse   . Atrial flutter ([place])     a. recurrent AFlutter with RVR;  b. Amiodarone Rx started 4/16  . Prolonged Q-T interval on ECG   . COPD (chronic obstructive pulmonary disease) ([place])   . Persistent atrial fibrillation [facility])     Past Surgical History  Procedure Laterality Date  . Left and right heart catheterization with coronary angiogram N/A 11/06/2014    Procedure: LEFT AND RIGHT HEART CATHETERIZATION WITH CORONARY ANGIOGRAM;  Surgeon: Jettie Booze, MD;  Location: [facility] CATH LAB;  Service: Cardiovascular;  Laterality: N/A;    Current Medications: Outpatient Prescriptions Prior to Visit  Medication Sig Dispense Refill  . albuterol (PROVENTIL HFA;VENTOLIN HFA) 108 (90 BASE) MCG/ACT inhaler Inhale 2 puffs into the lungs every 6 (six) hours as needed for wheezing or shortness of breath. 1 Inhaler 2  . diclofenac sodium (VOLTAREN) 1 % GEL Apply 2  g topically 4 (four) times daily. 100 g 0  . digoxin (LANOXIN) 0.125 MG tablet Take 1 tablet (0.125 mg total) by mouth daily. 30 tablet 11  . furosemide (LASIX) 40 MG tablet Take 1.5 tablets (60 mg total) by mouth 2 (two) times daily. 90 tablet 11  . gabapentin (NEURONTIN) 300 MG capsule Take 300 mg by mouth 2 (two) times daily.   6  . glipiZIDE (GLUCOTROL) 10 MG tablet Take 20 mg by mouth 2 (two) times daily before a meal.    . hydrALAZINE (APRESOLINE) 25 MG tablet Take 1 tablet (25 mg total) by mouth 3 (three) times daily. 90 tablet 11  .  isosorbide mononitrate (IMDUR) 30 MG 24 hr tablet Take 1 tablet (30 mg total) by mouth daily. 30 tablet 3  . metFORMIN (GLUCOPHAGE) 1000 MG tablet Take 1,000 mg by mouth 2 (two) times daily with a meal.    . methadone (DOLOPHINE) 10 MG tablet Take 10 mg by mouth daily.  0  . potassium chloride SA (K-DUR,KLOR-CON) 20 MEQ tablet Take 2 tablets (40 mEq total) by mouth daily. 30 tablet 0  . rivaroxaban (XARELTO) 20 MG TABS tablet Take 1 tablet (20 mg total) by mouth daily with supper. 30 tablet 0  . rosuvastatin (CRESTOR) 20 MG tablet Take 20 mg by mouth daily.    . sacubitril-valsartan (ENTRESTO) 49-51 MG Take 1 tablet by mouth 2 (two) times daily. 60 tablet 11  . spironolactone (ALDACTONE) 25 MG tablet Take 1 tablet (25 mg total) by mouth daily. 30 tablet 11  . Vitamin D, Ergocalciferol, (DRISDOL) 50000 UNITS CAPS capsule Take 50,000 Units by mouth every 7 (seven) days. On Sunday.    Marland Kitchen amiodarone (PACERONE) 200 MG tablet Take 200 mg by mouth 2 (two) times daily.    . carvedilol (COREG) 25 MG tablet Take 1 tablet (25 mg total) by mouth 2 (two) times daily with a meal. 60 tablet 11   No facility-administered medications prior to visit.     Allergies:   Review of patient's allergies indicates no known allergies.   Social History   Social History  . Marital Status: Single    Spouse Name: N/A  . Number of Children: N/A  . Years of Education: N/A   Social History Main Topics  . Smoking status: Former Smoker -- 0.05 packs/day    Types: Cigarettes    Quit date: 08/30/2015  . Smokeless tobacco: Never Used  . Alcohol Use: No  . Drug Use: No  . Sexual Activity: Not Asked   Other Topics Concern  . None   Social History Narrative     Family History:  The patient's family history includes Heart disease in his father and mother; Heart failure in his mother; Hypertension in his mother and sister. There is no history of Heart attack or Stroke.   ROS:   Please see the history of present  illness.    ROS All other systems reviewed and are negative.   Physical Exam:    VS:  BP 160/80 mmHg  Pulse 140  Ht 6' (1.829 m)  Wt 273 lb 12.8 oz (124.195 kg)  BMI 37.13 kg/m2  SpO2 97%   GEN: Well nourished, well developed, in no acute distress HEENT: normal Neck: no JVD, no masses Cardiac: Normal S1/S2, irregularly irregular rhythm; no murmurs,  no edema   Respiratory:  clear to auscultation bilaterally; no wheezing, rhonchi or rales GI: soft, nontender, nondistended MS: no deformity or atrophy Skin: warm and dry  Neuro: No focal deficits  Psych: Alert and oriented x 3, normal affect  Wt Readings from Last 3 Encounters:  01/21/16 273 lb 12.8 oz (124.195 kg)  10/09/15 269 lb (122.018 kg)  10/01/15 269 lb (122.018 kg)      Studies/Labs Reviewed:     EKG:  EKG is  ordered today.  The ekg ordered today demonstrates AFlutter, HR 113  Recent Labs: 02/21/2015: B Natriuretic Peptide 705.3* 02/23/2015: Magnesium 1.9 08/02/2015: Pro B Natriuretic peptide (BNP) 363.0* 10/01/2015: ALT 11; BUN 10; Creat 0.79; Hemoglobin 13.1; Platelets 361; Potassium 4.1; Sodium 137; TSH 1.293   Recent Lipid Panel    Component Value Date/Time   CHOL 147 11/01/2014 0216   TRIG 134 11/01/2014 0216   HDL 48 11/01/2014 0216   CHOLHDL 3.1 11/01/2014 0216   VLDL 27 11/01/2014 0216   LDLCALC 72 11/01/2014 0216    Additional studies/ records that were reviewed today include:   Echo 02/22/15 EF 25-30%, diffuse HK, mild MR, severe LAE  LHC 11/06/14 LM: Patent LAD: Mid vessel moderate diffuse disease LCx: Mild proximal disease. Distal disease - vessel too small for PCI RCA: Patent EF 25-30%, LVEDP 26 Conclusion: Mild diffuse non-obstructive CAD; NICM  Myoview 11/02/14 IMPRESSION: 1. Inferoapical scar/infarction. No findings for myocardial ischemia. 2. Mild global hypokinesis and no contraction or wall thickening in the area of scar. 3. Left ventricular ejection fraction 32% 4.  High-risk stress test findings*.   ASSESSMENT:     1. Chronic systolic CHF (congestive heart failure) ([place])   2. Nonischemic cardiomyopathy ([place])   3. Coronary artery disease involving native coronary artery of native heart without angina pectoris   4. Paroxysmal atrial fibrillation ([facility])   5. QT prolongation   6. Essential hypertension   7. Hyperlipidemia associated with type 2 diabetes mellitus ([place])     PLAN:     In order of problems listed above:  1. Chronic systolic CHF (congestive heart failure):Volume appears to remain stable. He is NYHA 2b-3. Continue beta-blocker, Spironolactone, Entresto 49/51, Imdur, Hydralazine.  2. NICM (nonischemic cardiomyopathy): Question if this is related to tachycardia.  He has seen Dr. Rayann Heman.  Med Rx was planned.  Notes indicate the patient did not want any procedures.  See below, if HR is better controlled, consider repeating his Echo to recheck LVEF.    3. Coronary artery disease - Nonobstructive coronary artery disease by cardiac catheterization. He is not having any symptoms of angina. Continue aspirin, statin.  4. Atrial fibrillation and flutter: He remains on Xarelto.  He never got PFTs done.  He is back in atrial flutter with RVR today. I reviewed his case again with Dr. Rayann Heman. He is concerned that RFCA of atrial flutter will only lead to more atrial fibrillation. If his rate can be controlled with medical therapy, this is a better case scenario. He seemed to do better on a higher dose of amiodarone. He was on 400 mg a day when he saw Dr. Rayann Heman previously and his amiodarone level was low at that point. We will try to advance his amiodarone and beta blocker for better rate control. He may also be a candidate for the genetic-AF trial.  -  Increase amiodarone to 200 mg twice a day  -  Increase carvedilol to 37.5 mg twice a day  -  Notes to Research RN Berneda Rose) - ? Genetic AF  5. Prolonged Q-T interval on ECG: Avoid QT prolonging  drugs.   6. Essential hypertension: Uncontrolled.  Increase Coreg  as noted.   7. Hyperlipidemia: Continue statin.    Medication Adjustments/Labs and Tests Ordered: Current medicines are reviewed at length with the patient today.  Concerns regarding medicines are outlined above.  Medication changes, Labs and Tests ordered today are outlined in the Patient Instructions noted below. Patient Instructions  Medication Instructions:  1. INCREASE AMIODARONE TO 200 MG TWICE DAILY; NEW RX SENT 2. INCREASE COREG TO 37.5 MG TWICE DAILY; NEW RX SENT  Labwork: NONE  Testing/Procedures: NONE  Follow-Up: SCOTT WEAVER, PAC 3-4 WEEKS   PLEASE GET PFT'S SCHEDULED; ORDER IN THE SYSTEM   Any Other Special Instructions Will Be Listed Below (If Applicable).  If you need a refill on your cardiac medications before your next appointment, please call your pharmacy.   Signed, [name], PA-C  01/21/2016 5:30 PM    [facility] [address], [place], [place]  [address] Phone: [phone]; Fax: [phone]

## 2016-01-21 ENCOUNTER — Encounter: Payer: Self-pay | Admitting: Physician Assistant

## 2016-01-21 ENCOUNTER — Ambulatory Visit (INDEPENDENT_AMBULATORY_CARE_PROVIDER_SITE_OTHER): Payer: Medicare Other | Admitting: Physician Assistant

## 2016-01-21 VITALS — BP 160/80 | HR 140 | Ht 72.0 in | Wt 273.8 lb

## 2016-01-21 DIAGNOSIS — E785 Hyperlipidemia, unspecified: Secondary | ICD-10-CM

## 2016-01-21 DIAGNOSIS — I480 Paroxysmal atrial fibrillation: Secondary | ICD-10-CM | POA: Diagnosis not present

## 2016-01-21 DIAGNOSIS — I429 Cardiomyopathy, unspecified: Secondary | ICD-10-CM | POA: Diagnosis not present

## 2016-01-21 DIAGNOSIS — I5022 Chronic systolic (congestive) heart failure: Secondary | ICD-10-CM | POA: Diagnosis not present

## 2016-01-21 DIAGNOSIS — I10 Essential (primary) hypertension: Secondary | ICD-10-CM

## 2016-01-21 DIAGNOSIS — I428 Other cardiomyopathies: Secondary | ICD-10-CM

## 2016-01-21 DIAGNOSIS — I2510 Atherosclerotic heart disease of native coronary artery without angina pectoris: Secondary | ICD-10-CM

## 2016-01-21 DIAGNOSIS — E1169 Type 2 diabetes mellitus with other specified complication: Secondary | ICD-10-CM

## 2016-01-21 DIAGNOSIS — I4581 Long QT syndrome: Secondary | ICD-10-CM

## 2016-01-21 DIAGNOSIS — R9431 Abnormal electrocardiogram [ECG] [EKG]: Secondary | ICD-10-CM

## 2016-01-21 MED ORDER — AMIODARONE HCL 200 MG PO TABS: 200.0000 mg | ORAL_TABLET | Freq: Two times a day (BID) | ORAL | Status: DC

## 2016-01-21 MED ORDER — CARVEDILOL 25 MG PO TABS: 37.5000 mg | ORAL_TABLET | Freq: Two times a day (BID) | ORAL | Status: DC

## 2016-01-21 NOTE — Patient Instructions (Addendum)
Medication Instructions:  1. INCREASE AMIODARONE TO 200 MG TWICE DAILY; NEW RX SENT 2. INCREASE COREG TO 37.5 MG TWICE DAILY; NEW RX SENT  Labwork: NONE  Testing/Procedures: NONE  Follow-Up: SCOTT WEAVER, PAC 3-4 WEEKS   PLEASE GET PFT'S SCHEDULED; ORDER IN THE SYSTEM   Any Other Special Instructions Will Be Listed Below (If Applicable).  If you need a refill on your cardiac medications before your next appointment, please call your pharmacy.

## 2016-02-11 DIAGNOSIS — G894 Chronic pain syndrome: Secondary | ICD-10-CM | POA: Diagnosis not present

## 2016-02-11 DIAGNOSIS — M792 Neuralgia and neuritis, unspecified: Secondary | ICD-10-CM | POA: Diagnosis not present

## 2016-02-11 DIAGNOSIS — Z79899 Other long term (current) drug therapy: Secondary | ICD-10-CM | POA: Diagnosis not present

## 2016-02-11 DIAGNOSIS — Z79891 Long term (current) use of opiate analgesic: Secondary | ICD-10-CM | POA: Diagnosis not present

## 2016-02-11 DIAGNOSIS — M25559 Pain in unspecified hip: Secondary | ICD-10-CM | POA: Diagnosis not present

## 2016-02-11 DIAGNOSIS — M545 Low back pain: Secondary | ICD-10-CM | POA: Diagnosis not present

## 2016-02-13 NOTE — Progress Notes (Signed)
Cardiology Office Note:    Date:  02/13/2016   ID:  Nicholas Caldwell, DOB Aug 17, 1946, MRN [id number]  PCP:  [facility]  Cardiologist:  Dr. Casandra Doffing [facility], PA-C)  Electrophysiologist:  Dr. Thompson Grayer   Chief Complaint  Patient presents with  . Atrial Fibrillation    follow up  . Congestive Heart Failure    follow up    History of Present Illness:     Nicholas Caldwell is a 70 y.o. male with a hx of nonischemic cardiomyopathy dx 5/57, systolic HF, HTN, HL, diabetes, tobacco abuse. Cardiac catheterization January 2016 demonstrated mild nonobstructive CAD.   Admitted 3/22 with a/c systolic HF and elevated Troponin levels 2/2 demand ischemia in the setting of atrial fibrillation/flutter with RVR. CHADS2-VASc=4. He was placed on Xarelto for anticoagulation. He was seen by EP and was not felt to be a candidate for flecainide secondary cardiomyopathy. He was not felt to be a candidate for Tikosyn given his prolonged QT. Success of ablation would be limited due to left atrial size. He was screened for Genetic AF and was not felt to be a good candidate. It was ultimately decided to place him on amiodarone for rhythm control. Plan was to follow him in the AF Clinic but he was lost to FU.   He has had transient episodes of AFlutter noted in the office in the past. I adjusted his Amiodarone. I had him FU with EP to see if he can be considered for RFCA as his arrhythmia may be contributing to his DCM. He saw Dr. Rayann Heman 10/01/15. Apparently he declined ICD at that time. Last seen by Dr. [name] 12/16. Isosorbide was added to his medical regimen. He returns for follow-up.  Recently, he has had increasing urinary frequency. His PCP has treated him for a UTI. He's not noticed much of a difference in his urinary frequency since starting on antibiotics. He denies dysuria. He has chronic dyspnea and exertion. He denies any changes. He denies PND or pedal edema. He does sleep on 2 pillows  without change. Denies any significant increase in abdominal girth. Denies syncope.   Past Medical History  Diagnosis Date  . Hypertension   . Diabetes mellitus without complication ([place])   . Hypercholesteremia   . Chronic systolic CHF (congestive heart failure) ([place])   . NICM (nonischemic cardiomyopathy) ([place])     a. Echo 4/16:  EF 25-30%, diffuse HK, mild MR, severe LAE  . CAD (coronary artery disease)     a. LHC 1/16:  mLAD diffuse disease, pLCx mild disease, dLCx with disease but too small for PCI, RCA ok, EF 25-30%  . Tobacco abuse   . Atrial flutter ([place])     a. recurrent AFlutter with RVR;  b. Amiodarone Rx started 4/16  . Prolonged Q-T interval on ECG   . COPD (chronic obstructive pulmonary disease) ([place])   . Persistent atrial fibrillation [facility])     Past Surgical History  Procedure Laterality Date  . Left and right heart catheterization with coronary angiogram N/A 11/06/2014    Procedure: LEFT AND RIGHT HEART CATHETERIZATION WITH CORONARY ANGIOGRAM;  Surgeon: Jettie Booze, MD;  Location: [facility] CATH LAB;  Service: Cardiovascular;  Laterality: N/A;    Current Medications: Outpatient Prescriptions Prior to Visit  Medication Sig Dispense Refill  . albuterol (PROVENTIL HFA;VENTOLIN HFA) 108 (90 BASE) MCG/ACT inhaler Inhale 2 puffs into the lungs every 6 (six) hours as needed for wheezing or shortness of breath. 1 Inhaler 2  .  amiodarone (PACERONE) 200 MG tablet Take 1 tablet (200 mg total) by mouth 2 (two) times daily. 60 tablet 11  . carvedilol (COREG) 25 MG tablet Take 1.5 tablets (37.5 mg total) by mouth 2 (two) times daily with a meal. 90 tablet 11  . diclofenac sodium (VOLTAREN) 1 % GEL Apply 2 g topically 4 (four) times daily. 100 g 0  . digoxin (LANOXIN) 0.125 MG tablet Take 1 tablet (0.125 mg total) by mouth daily. 30 tablet 11  . furosemide (LASIX) 40 MG tablet Take 1.5 tablets (60 mg total) by mouth 2 (two) times daily. 90 tablet 11  . gabapentin (NEURONTIN) 300 MG  capsule Take 300 mg by mouth 2 (two) times daily.   6  . glipiZIDE (GLUCOTROL) 10 MG tablet Take 20 mg by mouth 2 (two) times daily before a meal.    . hydrALAZINE (APRESOLINE) 25 MG tablet Take 1 tablet (25 mg total) by mouth 3 (three) times daily. 90 tablet 11  . isosorbide mononitrate (IMDUR) 30 MG 24 hr tablet Take 1 tablet (30 mg total) by mouth daily. 30 tablet 3  . metFORMIN (GLUCOPHAGE) 1000 MG tablet Take 1,000 mg by mouth 2 (two) times daily with a meal.    . methadone (DOLOPHINE) 10 MG tablet Take 10 mg by mouth daily.  0  . potassium chloride SA (K-DUR,KLOR-CON) 20 MEQ tablet Take 2 tablets (40 mEq total) by mouth daily. 30 tablet 0  . rivaroxaban (XARELTO) 20 MG TABS tablet Take 1 tablet (20 mg total) by mouth daily with supper. 30 tablet 0  . rosuvastatin (CRESTOR) 20 MG tablet Take 20 mg by mouth daily.    . sacubitril-valsartan (ENTRESTO) 49-51 MG Take 1 tablet by mouth 2 (two) times daily. 60 tablet 11  . spironolactone (ALDACTONE) 25 MG tablet Take 1 tablet (25 mg total) by mouth daily. 30 tablet 11  . Vitamin D, Ergocalciferol, (DRISDOL) 50000 UNITS CAPS capsule Take 50,000 Units by mouth every 7 (seven) days. On Sunday.     No facility-administered medications prior to visit.     Allergies:   Review of patient's allergies indicates no known allergies.   Social History   Social History  . Marital Status: Single    Spouse Name: N/A  . Number of Children: N/A  . Years of Education: N/A   Social History Main Topics  . Smoking status: Former Smoker -- 0.05 packs/day    Types: Cigarettes    Quit date: 08/30/2015  . Smokeless tobacco: Never Used  . Alcohol Use: No  . Drug Use: No  . Sexual Activity: Not on file   Other Topics Concern  . Not on file   Social History Narrative     Family History:  The patient's family history includes Heart disease in his father and mother; Heart failure in his mother; Hypertension in his mother and sister. There is no history of  Heart attack or Stroke.   ROS:   Please see the history of present illness.    ROS All other systems reviewed and are negative.   Physical Exam:    VS:  There were no vitals taken for this visit.   GEN: Well nourished, well developed, in no acute distress HEENT: normal Neck: no JVD, no masses Cardiac: Normal S1/S2, irregularly irregular rhythm; no murmurs,  no edema   Respiratory:  clear to auscultation bilaterally; no wheezing, rhonchi or rales GI: soft, nontender, nondistended MS: no deformity or atrophy Skin: warm and dry Neuro: No focal  deficits  Psych: Alert and oriented x 3, normal affect  Wt Readings from Last 3 Encounters:  01/21/16 273 lb 12.8 oz (124.195 kg)  10/09/15 269 lb (122.018 kg)  10/01/15 269 lb (122.018 kg)      Studies/Labs Reviewed:     EKG:  EKG is  ordered today.  The ekg ordered today demonstrates AFlutter, HR 113  Recent Labs: 02/21/2015: B Natriuretic Peptide 705.3* 02/23/2015: Magnesium 1.9 08/02/2015: Pro B Natriuretic peptide (BNP) 363.0* 10/01/2015: ALT 11; BUN 10; Creat 0.79; Hemoglobin 13.1; Platelets 361; Potassium 4.1; Sodium 137; TSH 1.293   Recent Lipid Panel    Component Value Date/Time   CHOL 147 11/01/2014 0216   TRIG 134 11/01/2014 0216   HDL 48 11/01/2014 0216   CHOLHDL 3.1 11/01/2014 0216   VLDL 27 11/01/2014 0216   LDLCALC 72 11/01/2014 0216    Additional studies/ records that were reviewed today include:   Echo 02/22/15 EF 25-30%, diffuse HK, mild MR, severe LAE  LHC 11/06/14 LM: Patent LAD: Mid vessel moderate diffuse disease LCx: Mild proximal disease. Distal disease - vessel too small for PCI RCA: Patent EF 25-30%, LVEDP 26 Conclusion: Mild diffuse non-obstructive CAD; NICM  Myoview 11/02/14 IMPRESSION: 1. Inferoapical scar/infarction. No findings for myocardial ischemia. 2. Mild global hypokinesis and no contraction or wall thickening in the area of scar. 3. Left ventricular ejection fraction 32% 4.  High-risk stress test findings*.   ASSESSMENT:     1. Chronic systolic CHF (congestive heart failure) ([place])   2. Nonischemic cardiomyopathy ([place])   3. Coronary artery disease involving native coronary artery of native heart without angina pectoris   4. Paroxysmal atrial fibrillation (H[facility])   5. QT prolongation   6. Essential hypertension   7. Hyperlipidemia associated with type 2 diabetes mellitus ([place])     PLAN:     In order of problems listed above:  1. Chronic systolic CHF (congestive heart failure):Volume appears to remain stable. He is NYHA 2b-3. Continue beta-blocker, Spironolactone, Entresto 49/51, Imdur, Hydralazine.  2. NICM (nonischemic cardiomyopathy): Question if this is related to tachycardia.  He has seen Dr. Rayann Heman.  Med Rx was planned.  Notes indicate the patient did not want any procedures.  See below, if HR is better controlled, consider repeating his Echo to recheck LVEF.    3. Coronary artery disease - Nonobstructive coronary artery disease by cardiac catheterization. He is not having any symptoms of angina. Continue aspirin, statin.  4. Atrial fibrillation and flutter: He remains on Xarelto.  He never got PFTs done.  He is back in atrial flutter with RVR today. I reviewed his case again with Dr. Rayann Heman. He is concerned that RFCA of atrial flutter will only lead to more atrial fibrillation. If his rate can be controlled with medical therapy, this is a better case scenario. He seemed to do better on a higher dose of amiodarone. He was on 400 mg a day when he saw Dr. Rayann Heman previously and his amiodarone level was low at that point. We will try to advance his amiodarone and beta blocker for better rate control. He may also be a candidate for the genetic-AF trial.  -  Increase amiodarone to 200 mg twice a day  -  Increase carvedilol to 37.5 mg twice a day  -  Notes to Research RN Berneda Rose) - ? Genetic AF  5. Prolonged Q-T interval on ECG: Avoid QT prolonging  drugs.   6. Essential hypertension: Uncontrolled.  Increase Coreg as noted.  7. Hyperlipidemia: Continue statin.    Medication Adjustments/Labs and Tests Ordered: Current medicines are reviewed at length with the patient today.  Concerns regarding medicines are outlined above.  Medication changes, Labs and Tests ordered today are outlined in the Patient Instructions noted below. There are no Patient Instructions on file for this visit. Signed, [name], PA-C  02/13/2016 1:36 PM    [facility] [address], [place], [place]  [address] Phone: [phone]; Fax: [phone]     This encounter was created in error - please disregard.

## 2016-02-14 ENCOUNTER — Encounter: Payer: Medicare Other | Admitting: Physician Assistant

## 2016-03-02 NOTE — Progress Notes (Signed)
Cardiology Office Note:    Date:  03/02/2016   ID:  Nicholas Caldwell, DOB July 27, 1946, MRN [id number]  PCP:  [facility]  Cardiologist:  Dr. Casandra Doffing [facility], PA-C)  Electrophysiologist:  Dr. Thompson Grayer   Chief Complaint  Patient presents with  . Congestive Heart Failure    Follow up    History of Present Illness:     Nicholas Caldwell is a 70 y.o. male with a hx of nonischemic cardiomyopathy dx 2/83, systolic HF, HTN, HL, diabetes, tobacco abuse. Cardiac catheterization January 2016 demonstrated mild nonobstructive CAD.   Admitted 1/51 with a/c systolic HF and elevated Troponin levels 2/2 demand ischemia in the setting of atrial fibrillation/flutter with RVR. CHADS2-VASc=4. He was placed on Xarelto for anticoagulation. He was seen by EP and was not felt to be a candidate for flecainide secondary cardiomyopathy. He was not felt to be a candidate for Tikosyn given his prolonged QT. Success of ablation would be limited due to left atrial size. He was screened for Genetic AF and was not felt to be a good candidate. It was ultimately decided to place him on amiodarone for rhythm control. Plan was to follow him in the AF Clinic but he was lost to FU.   He has had transient episodes of AFlutter noted in the office in the past. I adjusted his Amiodarone. I had him FU with EP to see if he can be considered for RFCA as his arrhythmia may be contributing to his DCM. He saw Dr. Rayann Heman 10/01/15. Apparently he declined ICD at that time. Last seen by Dr. [name] 12/16. Isosorbide was added to his medical regimen. He returns for follow-up.  Recently, he has had increasing urinary frequency. His PCP has treated him for a UTI. He's not noticed much of a difference in his urinary frequency since starting on antibiotics. He denies dysuria. He has chronic dyspnea and exertion. He denies any changes. He denies PND or pedal edema. He does sleep on 2 pillows without change. Denies any significant  increase in abdominal girth. Denies syncope.   Past Medical History  Diagnosis Date  . Hypertension   . Diabetes mellitus without complication ([place])   . Hypercholesteremia   . Chronic systolic CHF (congestive heart failure) ([place])   . NICM (nonischemic cardiomyopathy) ([place])     a. Echo 4/16:  EF 25-30%, diffuse HK, mild MR, severe LAE  . CAD (coronary artery disease)     a. LHC 1/16:  mLAD diffuse disease, pLCx mild disease, dLCx with disease but too small for PCI, RCA ok, EF 25-30%  . Tobacco abuse   . Atrial flutter ([place])     a. recurrent AFlutter with RVR;  b. Amiodarone Rx started 4/16  . Prolonged Q-T interval on ECG   . COPD (chronic obstructive pulmonary disease) ([place])   . Persistent atrial fibrillation [facility])     Past Surgical History  Procedure Laterality Date  . Left and right heart catheterization with coronary angiogram N/A 11/06/2014    Procedure: LEFT AND RIGHT HEART CATHETERIZATION WITH CORONARY ANGIOGRAM;  Surgeon: Jettie Booze, MD;  Location: [facility] CATH LAB;  Service: Cardiovascular;  Laterality: N/A;    Current Medications: Outpatient Prescriptions Prior to Visit  Medication Sig Dispense Refill  . albuterol (PROVENTIL HFA;VENTOLIN HFA) 108 (90 BASE) MCG/ACT inhaler Inhale 2 puffs into the lungs every 6 (six) hours as needed for wheezing or shortness of breath. 1 Inhaler 2  . amiodarone (PACERONE) 200 MG tablet Take 1 tablet (  200 mg total) by mouth 2 (two) times daily. 60 tablet 11  . carvedilol (COREG) 25 MG tablet Take 1.5 tablets (37.5 mg total) by mouth 2 (two) times daily with a meal. 90 tablet 11  . diclofenac sodium (VOLTAREN) 1 % GEL Apply 2 g topically 4 (four) times daily. 100 g 0  . digoxin (LANOXIN) 0.125 MG tablet Take 1 tablet (0.125 mg total) by mouth daily. 30 tablet 11  . furosemide (LASIX) 40 MG tablet Take 1.5 tablets (60 mg total) by mouth 2 (two) times daily. 90 tablet 11  . gabapentin (NEURONTIN) 300 MG capsule Take 300 mg by mouth 2 (two)  times daily.   6  . glipiZIDE (GLUCOTROL) 10 MG tablet Take 20 mg by mouth 2 (two) times daily before a meal.    . hydrALAZINE (APRESOLINE) 25 MG tablet Take 1 tablet (25 mg total) by mouth 3 (three) times daily. 90 tablet 11  . isosorbide mononitrate (IMDUR) 30 MG 24 hr tablet Take 1 tablet (30 mg total) by mouth daily. 30 tablet 3  . metFORMIN (GLUCOPHAGE) 1000 MG tablet Take 1,000 mg by mouth 2 (two) times daily with a meal.    . methadone (DOLOPHINE) 10 MG tablet Take 10 mg by mouth daily.  0  . potassium chloride SA (K-DUR,KLOR-CON) 20 MEQ tablet Take 2 tablets (40 mEq total) by mouth daily. 30 tablet 0  . rivaroxaban (XARELTO) 20 MG TABS tablet Take 1 tablet (20 mg total) by mouth daily with supper. 30 tablet 0  . rosuvastatin (CRESTOR) 20 MG tablet Take 20 mg by mouth daily.    . sacubitril-valsartan (ENTRESTO) 49-51 MG Take 1 tablet by mouth 2 (two) times daily. 60 tablet 11  . spironolactone (ALDACTONE) 25 MG tablet Take 1 tablet (25 mg total) by mouth daily. 30 tablet 11  . Vitamin D, Ergocalciferol, (DRISDOL) 50000 UNITS CAPS capsule Take 50,000 Units by mouth every 7 (seven) days. On Sunday.     No facility-administered medications prior to visit.     Allergies:   Review of patient's allergies indicates no known allergies.   Social History   Social History  . Marital Status: Single    Spouse Name: N/A  . Number of Children: N/A  . Years of Education: N/A   Social History Main Topics  . Smoking status: Former Smoker -- 0.05 packs/day    Types: Cigarettes    Quit date: 08/30/2015  . Smokeless tobacco: Never Used  . Alcohol Use: No  . Drug Use: No  . Sexual Activity: Not on file   Other Topics Concern  . Not on file   Social History Narrative     Family History:  The patient's family history includes Heart disease in his father and mother; Heart failure in his mother; Hypertension in his mother and sister. There is no history of Heart attack or Stroke.   ROS:     Please see the history of present illness.    ROS  All other systems reviewed and are negative.   Physical Exam:    VS:  There were no vitals taken for this visit.   GEN: Well nourished, well developed, in no acute distress HEENT: normal Neck: no JVD, no masses Cardiac: Normal S1/S2, irregularly irregular rhythm; no murmurs,  no edema   Respiratory:  clear to auscultation bilaterally; no wheezing, rhonchi or rales GI: soft, nontender, nondistended MS: no deformity or atrophy Skin: warm and dry Neuro: No focal deficits  Psych: Alert and oriented  x 3, normal affect  Wt Readings from Last 3 Encounters:  01/21/16 273 lb 12.8 oz (124.195 kg)  10/09/15 269 lb (122.018 kg)  10/01/15 269 lb (122.018 kg)      Studies/Labs Reviewed:     EKG:  EKG is  ordered today.  The ekg ordered today demonstrates AFlutter, HR 113  Recent Labs: 08/02/2015: Pro B Natriuretic peptide (BNP) 363.0* 10/01/2015: ALT 11; BUN 10; Creat 0.79; Hemoglobin 13.1; Platelets 361; Potassium 4.1; Sodium 137; TSH 1.293   Recent Lipid Panel    Component Value Date/Time   CHOL 147 11/01/2014 0216   TRIG 134 11/01/2014 0216   HDL 48 11/01/2014 0216   CHOLHDL 3.1 11/01/2014 0216   VLDL 27 11/01/2014 0216   LDLCALC 72 11/01/2014 0216    Additional studies/ records that were reviewed today include:   Echo 02/22/15 EF 25-30%, diffuse HK, mild MR, severe LAE  LHC 11/06/14 LM: Patent LAD: Mid vessel moderate diffuse disease LCx: Mild proximal disease. Distal disease - vessel too small for PCI RCA: Patent EF 25-30%, LVEDP 26 Conclusion: Mild diffuse non-obstructive CAD; NICM  Myoview 11/02/14 IMPRESSION: 1. Inferoapical scar/infarction. No findings for myocardial ischemia. 2. Mild global hypokinesis and no contraction or wall thickening in the area of scar. 3. Left ventricular ejection fraction 32% 4. High-risk stress test findings*.   ASSESSMENT:     No diagnosis found.  PLAN:     In order  of problems listed above:  1. Chronic systolic CHF (congestive heart failure):Volume appears to remain stable. He is NYHA 2b-3. Continue beta-blocker, Spironolactone, Entresto 49/51, Imdur, Hydralazine.  2. NICM (nonischemic cardiomyopathy): Question if this is related to tachycardia.  He has seen Dr. Rayann Heman.  Med Rx was planned.  Notes indicate the patient did not want any procedures.  See below, if HR is better controlled, consider repeating his Echo to recheck LVEF.    3. Coronary artery disease - Nonobstructive coronary artery disease by cardiac catheterization. He is not having any symptoms of angina. Continue aspirin, statin.  4. Atrial fibrillation and flutter: He remains on Xarelto.  He never got PFTs done.  He is back in atrial flutter with RVR today. I reviewed his case again with Dr. Rayann Heman. He is concerned that RFCA of atrial flutter will only lead to more atrial fibrillation. If his rate can be controlled with medical therapy, this is a better case scenario. He seemed to do better on a higher dose of amiodarone. He was on 400 mg a day when he saw Dr. Rayann Heman previously and his amiodarone level was low at that point. We will try to advance his amiodarone and beta blocker for better rate control. He may also be a candidate for the genetic-AF trial.  -  Increase amiodarone to 200 mg twice a day  -  Increase carvedilol to 37.5 mg twice a day  -  Notes to Research RN Berneda Rose) - ? Genetic AF  5. Prolonged Q-T interval on ECG: Avoid QT prolonging drugs.   6. Essential hypertension: Uncontrolled.  Increase Coreg as noted.   7. Hyperlipidemia: Continue statin.    Medication Adjustments/Labs and Tests Ordered: Current medicines are reviewed at length with the patient today.  Concerns regarding medicines are outlined above.  Medication changes, Labs and Tests ordered today are outlined in the Patient Instructions noted below. There are no Patient Instructions on file for  this visit. Signed, [name], PA-C  03/02/2016 10:02 PM    [facility]  [address], [place], [place]  [address] Phone: [phone]; Fax: [phone]     This encounter was created in error - please disregard.

## 2016-03-03 ENCOUNTER — Encounter: Payer: Medicare Other | Admitting: Physician Assistant

## 2016-03-17 DIAGNOSIS — M25559 Pain in unspecified hip: Secondary | ICD-10-CM | POA: Diagnosis not present

## 2016-03-17 DIAGNOSIS — M545 Low back pain: Secondary | ICD-10-CM | POA: Diagnosis not present

## 2016-03-17 DIAGNOSIS — Z79891 Long term (current) use of opiate analgesic: Secondary | ICD-10-CM | POA: Diagnosis not present

## 2016-03-17 DIAGNOSIS — G894 Chronic pain syndrome: Secondary | ICD-10-CM | POA: Diagnosis not present

## 2016-03-17 DIAGNOSIS — Z79899 Other long term (current) drug therapy: Secondary | ICD-10-CM | POA: Diagnosis not present

## 2016-03-23 NOTE — Progress Notes (Signed)
Cardiology Office Note:    Date:  03/24/2016   ID:  Nicholas Caldwell, DOB 1946-07-02, MRN [id number]  PCP:  [facility]  Cardiologist: Dr. Casandra Doffing [name], PA-C)  Electrophysiologist: Dr. Thompson Grayer  Referring MD: [facility], Luvenia [place]*   Chief Complaint  Patient presents with  . Congestive Heart Failure    Follow up    History of Present Illness:     Nicholas Caldwell is a 70 y.o. male with a hx of nonischemic cardiomyopathy dx 0/35, systolic HF, AFib/Flutter, HTN, HL, diabetes, tobacco abuse. Cardiac catheterization January 2016 demonstrated mild nonobstructive CAD.   Admitted in 0/09 with a/c systolic HF and elevated Troponin levels 2/2 demand ischemia in the setting of atrial fibrillation/flutter with RVR. CHADS2-VASc=4. He was placed on Xarelto for anticoagulation. He was seen by EP and was not felt to be a candidate for flecainide secondary cardiomyopathy. He was not felt to be a candidate for Tikosyn given his prolonged QT. Success of ablation would be limited due to left atrial size. He was screened for Genetic AF and was not felt to be a good candidate. It was ultimately decided to place him on amiodarone for rhythm control.   He has had transient episodes of AFlutter noted in the office in the past. I have adjusted his Amiodarone. I had him FU with EP to see if he can be considered for RFCA as his arrhythmia may be contributing to his DCM. He saw Dr. Rayann Heman 10/01/15. Apparently he declined ICD at that time.   I saw him last in 3/17.  He was back in AFlutter in the office.  I reviewed his case again with Dr. Rayann Heman.  He felt the patient would not benefit from RFCA of AFlutter as this would likely result in more AFib.  He felt like the patient should be continued on rate control Rx.  I adjusted his Carvedilol to 37.5 mg bid and Amiodarone to 200 mg bid.  I had research review his chart again and he was not a candidate for Genetic AF. Returns for FU.   Here  alone.  He has noted increasing weight and LE edema at home.  Denies chest pain. Denies orthopnea, PND. Denies syncope.  He notes stable DOE.  He is NYhA 2b-3.  Denies cough.     Past Medical History  Diagnosis Date  . Hypertension   . Diabetes mellitus without complication ([place])   . Hypercholesteremia   . Chronic systolic CHF (congestive heart failure) ([place])   . NICM (nonischemic cardiomyopathy) ([place])     a. Echo 4/16:  EF 25-30%, diffuse HK, mild MR, severe LAE  . CAD (coronary artery disease)     a. LHC 1/16:  mLAD diffuse disease, pLCx mild disease, dLCx with disease but too small for PCI, RCA ok, EF 25-30%  . Tobacco abuse   . Atrial flutter ([place])     a. recurrent AFlutter with RVR;  b. Amiodarone Rx started 4/16  . Prolonged Q-T interval on ECG   . COPD (chronic obstructive pulmonary disease) ([place])   . Persistent atrial fibrillation [facility])     Past Surgical History  Procedure Laterality Date  . Left and right heart catheterization with coronary angiogram N/A 11/06/2014    Procedure: LEFT AND RIGHT HEART CATHETERIZATION WITH CORONARY ANGIOGRAM;  Surgeon: Jettie Booze, MD;  Location: [facility] CATH LAB;  Service: Cardiovascular;  Laterality: N/A;    Current Medications: Outpatient Prescriptions Prior to Visit  Medication Sig Dispense  Refill  . albuterol (PROVENTIL HFA;VENTOLIN HFA) 108 (90 BASE) MCG/ACT inhaler Inhale 2 puffs into the lungs every 6 (six) hours as needed for wheezing or shortness of breath. 1 Inhaler 2  . amiodarone (PACERONE) 200 MG tablet Take 1 tablet (200 mg total) by mouth 2 (two) times daily. 60 tablet 11  . carvedilol (COREG) 25 MG tablet Take 1.5 tablets (37.5 mg total) by mouth 2 (two) times daily with a meal. 90 tablet 11  . diclofenac sodium (VOLTAREN) 1 % GEL Apply 2 g topically 4 (four) times daily. 100 g 0  . digoxin (LANOXIN) 0.125 MG tablet Take 1 tablet (0.125 mg total) by mouth daily. 30 tablet 11  . furosemide (LASIX) 40 MG tablet Take 1.5  tablets (60 mg total) by mouth 2 (two) times daily. 90 tablet 11  . glipiZIDE (GLUCOTROL) 10 MG tablet Take 20 mg by mouth 2 (two) times daily before a meal.    . hydrALAZINE (APRESOLINE) 25 MG tablet Take 1 tablet (25 mg total) by mouth 3 (three) times daily. 90 tablet 11  . isosorbide mononitrate (IMDUR) 30 MG 24 hr tablet Take 1 tablet (30 mg total) by mouth daily. 30 tablet 3  . metFORMIN (GLUCOPHAGE) 1000 MG tablet Take 1,000 mg by mouth 2 (two) times daily with a meal.    . methadone (DOLOPHINE) 10 MG tablet Take 10 mg by mouth daily.  0  . potassium chloride SA (K-DUR,KLOR-CON) 20 MEQ tablet Take 2 tablets (40 mEq total) by mouth daily. 30 tablet 0  . rivaroxaban (XARELTO) 20 MG TABS tablet Take 1 tablet (20 mg total) by mouth daily with supper. 30 tablet 0  . rosuvastatin (CRESTOR) 20 MG tablet Take 20 mg by mouth daily.    . sacubitril-valsartan (ENTRESTO) 49-51 MG Take 1 tablet by mouth 2 (two) times daily. 60 tablet 11  . spironolactone (ALDACTONE) 25 MG tablet Take 1 tablet (25 mg total) by mouth daily. 30 tablet 11  . Vitamin D, Ergocalciferol, (DRISDOL) 50000 UNITS CAPS capsule Take 50,000 Units by mouth every 7 (seven) days. On Sunday.    . gabapentin (NEURONTIN) 300 MG capsule Take 300 mg by mouth 2 (two) times daily. Reported on 03/24/2016  6   No facility-administered medications prior to visit.      Allergies:   Review of patient's allergies indicates no known allergies.   Social History   Social History  . Marital Status: Single    Spouse Name: N/A  . Number of Children: N/A  . Years of Education: N/A   Social History Main Topics  . Smoking status: Former Smoker -- 0.05 packs/day    Types: Cigarettes    Quit date: 08/30/2015  . Smokeless tobacco: Never Used  . Alcohol Use: No  . Drug Use: No  . Sexual Activity: Not Asked   Other Topics Concern  . None   Social History Narrative     Family History:  The patient's family history includes Heart disease in his  father and mother; Heart failure in his mother; Hypertension in his mother and sister. There is no history of Heart attack or Stroke.   ROS:   Please see the history of present illness.    ROS All other systems reviewed and are negative.   Physical Exam:    VS:  BP 144/70 mmHg  Pulse 74  Ht 6' (1.829 m)  Wt 285 lb 6.4 oz (129.457 kg)  BMI 38.70 kg/m2  SpO2 88%   GEN: Well nourished,  well developed, in no acute distress HEENT: normal Neck: + JVD, no masses Cardiac: Normal S1/S2,  RRR; no murmurs, rubs, or gallops, 1-2+ bilateral LE edema;     Respiratory:  Decreased breath sounds bilaterally; no wheezing, rhonchi or rales GI: soft, nontender, nondistended MS: no deformity or atrophy Skin: warm and dry Neuro: No focal deficits  Psych: Alert and oriented x 3, normal affect  Wt Readings from Last 3 Encounters:  03/24/16 285 lb 6.4 oz (129.457 kg)  01/21/16 273 lb 12.8 oz (124.195 kg)  10/09/15 269 lb (122.018 kg)      Studies/Labs Reviewed:     EKG:  EKG is  ordered today.  The ekg ordered today demonstrates NSR, HR 73, normal axis, NSSTTW changes, QTc 475 ms  Recent Labs: 08/02/2015: Pro B Natriuretic peptide (BNP) 363.0* 10/01/2015: ALT 11; BUN 10; Creat 0.79; Hemoglobin 13.1; Platelets 361; Potassium 4.1; Sodium 137; TSH 1.293   Recent Lipid Panel    Component Value Date/Time   CHOL 147 11/01/2014 0216   TRIG 134 11/01/2014 0216   HDL 48 11/01/2014 0216   CHOLHDL 3.1 11/01/2014 0216   VLDL 27 11/01/2014 0216   LDLCALC 72 11/01/2014 0216    Additional studies/ records that were reviewed today include:   Echo 02/22/15 EF 25-30%, diffuse HK, mild MR, severe LAE  LHC 11/06/14 LM: Patent LAD: Mid vessel moderate diffuse disease LCx: Mild proximal disease. Distal disease - vessel too small for PCI RCA: Patent EF 25-30%, LVEDP 26 Conclusion: Mild diffuse non-obstructive CAD; NICM  Myoview 11/02/14 IMPRESSION: 1. Inferoapical scar/infarction. No findings  for myocardial ischemia. 2. Mild global hypokinesis and no contraction or wall thickening in the area of scar. 3. Left ventricular ejection fraction 32% 4. High-risk stress test findings*.   ASSESSMENT:     1. Chronic systolic CHF (congestive heart failure) ([place])   2. Nonischemic cardiomyopathy ([place])   3. Coronary artery disease involving native coronary artery of native heart without angina pectoris   4. Paroxysmal atrial flutter (HCC)   5. QT prolongation   6. Essential hypertension   7. Hyperlipidemia     PLAN:     In order of problems listed above:  1. A/C systolic CHF (congestive heart failure) - He is volume overloaded today.  Weights at home are up ~ 7lbs.  Up 12 lbs here.  O2 sats are running low.  They will increase to 90-91% on RA when he takes a deep breath.  He is not significant short of breath on exam today.  Reviewed case with Dr. Meda Coffee (DOD).  Will hold off on ordering O2 for now and diurese him first.  If O2 remains low with diuresis, will need to arrange home O2.  Continue beta-blocker, Spironolactone, Entresto 49/51, Imdur, Hydralazine.  He is also on Digoxin.    -  Continue Lasix 80 mg bid  -  Add Metolazone 2.5 mg 30 mins before Lasix tomorrow (Tues), Thursday, Saturday  -  Extra K+ 20 mEq on each Metolazone day  -  BMET, BNP  -  BMET 1 week  -  Close FU with me in 1 week.  -  He knows to go to the ED if feeling worse.  2. NICM (nonischemic cardiomyopathy): Question if this is related to tachycardia. He has seen Dr. Rayann Heman. Med Rx was previously planned. Notes indicate the patient did not want any procedures. Will need to consider repeat Echo in the near future.    3. Coronary artery disease - Nonobstructive  coronary artery disease by cardiac catheterization. He is not having any symptoms of angina. Continue aspirin, statin.    4. Atrial fibrillation and flutter: He remains on Xarelto. He never got PFTs done. As noted, I have reviewed with Dr. Rayann Heman  and the patient would be better off with rate control/ant-arrhythmic drug therapy.  RFCA for AFlutter would result in more AFib.  He is back in NSR today. Continue amiodarone, beta blocker.  Will try to get PFTs done as he will likely remain on Amiodarone long term.    -  Arrange PFTs with DLCO with PCP at [facility]  -  BMET, TSH, LFTs, CBC today.  5. Prolonged Q-T interval on ECG: Avoid QT prolonging drugs.   6. Essential hypertension: Borderline control.   7. Hyperlipidemia: Continue statin.   Total time spent with patient today 40 minutes. This includes evaluating the patient, reviewing records and coordinating care. Face-to-face time >50%.   Medication Adjustments/Labs and Tests Ordered: Current medicines are reviewed at length with the patient today.  Concerns regarding medicines are outlined above.  Medication changes, Labs and Tests ordered today are outlined in the Patient Instructions noted below. Patient Instructions  Medication Instructions:  1. START METOLAZONE 2.5 MG 1 TABLET Tuesday 5/23, Thursday 5/24 and Saturday 5/26; YOU WILL NEED TO TAKE THIS 30 MINUTES BEFORE YOUR MORNING DOSE OF LASIX  2. CONTINUE LASIX 80 MG TWICE A DAY 3. CONTINUE YOUR POTASSIUM AS YOU ARE CURRENTLY TAKING HOWEVER; YOU WILL NEED TO TAKE AN EXTRA POTASSIUM 20 MEQ TABLET ON YOUR METOLAZONE DAYS Labwork: 1. TODAY BMET, CBC W/DIFF, TSH, LFT, BNP 2. 1 WEEK BMET; YOU CAN HAVE THIS DONE AT YOUR FOLLOW UP WITH [place], [facility]  Testing/Procedures: Marlynn Perking Follow-Up: [name], [facility] NEXT Tuesday  04/01/16 @ 3 PM Any Other Special Instructions Will Be Listed Below (If Applicable). YOU [facility] BE[facility] CALLED A PULMONARY FUNCTION TEST If you need a refill on your cardiac medications before your next appointment, please call your pharmacy.    Signed, [name], PA-C  03/24/2016 4:56 PM    [facility] [place], [place], [place]   [address] Phone: [phone]; Fax: [phone]

## 2016-03-24 ENCOUNTER — Ambulatory Visit (INDEPENDENT_AMBULATORY_CARE_PROVIDER_SITE_OTHER): Payer: Medicare Other | Admitting: Physician Assistant

## 2016-03-24 ENCOUNTER — Encounter: Payer: Self-pay | Admitting: Physician Assistant

## 2016-03-24 VITALS — BP 144/70 | HR 74 | Ht 72.0 in | Wt 285.4 lb

## 2016-03-24 DIAGNOSIS — I429 Cardiomyopathy, unspecified: Secondary | ICD-10-CM

## 2016-03-24 DIAGNOSIS — I4892 Unspecified atrial flutter: Secondary | ICD-10-CM

## 2016-03-24 DIAGNOSIS — E785 Hyperlipidemia, unspecified: Secondary | ICD-10-CM

## 2016-03-24 DIAGNOSIS — I5022 Chronic systolic (congestive) heart failure: Secondary | ICD-10-CM | POA: Diagnosis not present

## 2016-03-24 DIAGNOSIS — R9431 Abnormal electrocardiogram [ECG] [EKG]: Secondary | ICD-10-CM

## 2016-03-24 DIAGNOSIS — I2510 Atherosclerotic heart disease of native coronary artery without angina pectoris: Secondary | ICD-10-CM

## 2016-03-24 DIAGNOSIS — I10 Essential (primary) hypertension: Secondary | ICD-10-CM

## 2016-03-24 DIAGNOSIS — I509 Heart failure, unspecified: Secondary | ICD-10-CM | POA: Diagnosis not present

## 2016-03-24 DIAGNOSIS — I4581 Long QT syndrome: Secondary | ICD-10-CM

## 2016-03-24 DIAGNOSIS — I428 Other cardiomyopathies: Secondary | ICD-10-CM

## 2016-03-24 LAB — CBC WITH DIFFERENTIAL/PLATELET
BASOS PCT: 0 %
Basophils Absolute: 0 cells/uL (ref 0–200)
EOS PCT: 2 %
Eosinophils Absolute: 152 cells/uL (ref 15–500)
HEMATOCRIT: 37.4 % — AB (ref 38.5–50.0)
HEMOGLOBIN: 11.9 g/dL — AB (ref 13.2–17.1)
LYMPHS ABS: 1824 {cells}/uL (ref 850–3900)
Lymphocytes Relative: 24 %
MCH: 26.9 pg — ABNORMAL LOW (ref 27.0–33.0)
MCHC: 31.8 g/dL — ABNORMAL LOW (ref 32.0–36.0)
MCV: 84.4 fL (ref 80.0–100.0)
MONO ABS: 456 {cells}/uL (ref 200–950)
MPV: 9.5 fL (ref 7.5–12.5)
Monocytes Relative: 6 %
NEUTROS ABS: 5168 {cells}/uL (ref 1500–7800)
Neutrophils Relative %: 68 %
Platelets: 422 10*3/uL — ABNORMAL HIGH (ref 140–400)
RBC: 4.43 MIL/uL (ref 4.20–5.80)
RDW: 14.5 % (ref 11.0–15.0)
WBC: 7.6 10*3/uL (ref 3.8–10.8)

## 2016-03-24 LAB — HEPATIC FUNCTION PANEL
ALBUMIN: 3.5 g/dL — AB (ref 3.6–5.1)
ALK PHOS: 108 U/L (ref 40–115)
ALT: 11 U/L (ref 9–46)
AST: 11 U/L (ref 10–35)
BILIRUBIN DIRECT: 0.1 mg/dL (ref <=0.2)
BILIRUBIN TOTAL: 0.3 mg/dL (ref 0.2–1.2)
Indirect Bilirubin: 0.2 mg/dL (ref 0.2–1.2)
Total Protein: 6.7 g/dL (ref 6.1–8.1)

## 2016-03-24 LAB — BASIC METABOLIC PANEL
BUN: 13 mg/dL (ref 7–25)
CALCIUM: 8.9 mg/dL (ref 8.6–10.3)
CO2: 28 mmol/L (ref 20–31)
Chloride: 99 mmol/L (ref 98–110)
Creat: 0.76 mg/dL (ref 0.70–1.25)
Glucose, Bld: 170 mg/dL — ABNORMAL HIGH (ref 65–99)
POTASSIUM: 4.4 mmol/L (ref 3.5–5.3)
SODIUM: 139 mmol/L (ref 135–146)

## 2016-03-24 LAB — TSH: TSH: 1.32 m[IU]/L (ref 0.40–4.50)

## 2016-03-24 MED ORDER — METOLAZONE 2.5 MG PO TABS: 2.5000 mg | ORAL_TABLET | ORAL | Status: DC

## 2016-03-24 NOTE — Patient Instructions (Addendum)
Medication Instructions:  1. START METOLAZONE 2.5 MG 1 TABLET Tuesday 5/23, Thursday 5/24 and Saturday 5/26; YOU WILL NEED TO TAKE THIS 30 MINUTES BEFORE YOUR MORNING DOSE OF LASIX  2. CONTINUE LASIX 80 MG TWICE A DAY 3. CONTINUE YOUR POTASSIUM AS YOU ARE CURRENTLY TAKING HOWEVER; YOU WILL NEED TO TAKE AN EXTRA POTASSIUM 20 MEQ TABLET ON YOUR METOLAZONE DAYS Labwork: 1. TODAY BMET, CBC W/DIFF, TSH, LFT, BNP 2. 1 WEEK BMET; YOU CAN HAVE THIS DONE AT YOUR FOLLOW UP WITH [place], [facility]  Testing/Procedures: Marlynn Perking Follow-Up: [name], [facility] NEXT Tuesday  04/01/16 @ 3 PM Any Other Special Instructions Will Be Listed Below (If Applicable). YOU HAVE BEEN GIVEN AN [facility] CALLED A PULMONARY FUNCTION TEST If you need a refill on your cardiac medications before your next appointment, please call your pharmacy.

## 2016-03-25 ENCOUNTER — Telehealth: Payer: Self-pay | Admitting: *Deleted

## 2016-03-25 LAB — BRAIN NATRIURETIC PEPTIDE: BRAIN NATRIURETIC PEPTIDE: 191.4 pg/mL — AB (ref <100)

## 2016-03-25 NOTE — Telephone Encounter (Signed)
Lmtcb to go over lab results and recommendation per Brynda Rim. PA to only take Metolazone x 2 days and not 3 days as outlined in office visit 5/22.

## 2016-03-28 NOTE — Telephone Encounter (Signed)
Lmtcb x 2 to go over results

## 2016-03-30 IMAGING — DX DG CHEST 1V PORT
1 series · 1 of 1 positions shown · non-contrast
Comparison: Portable exam 0330 hr compared to 03/27/2005

CLINICAL DATA: Shortness of breath, hypertension, diabetes, smoker

EXAM:
PORTABLE CHEST - 1 VIEW

[chest ap]
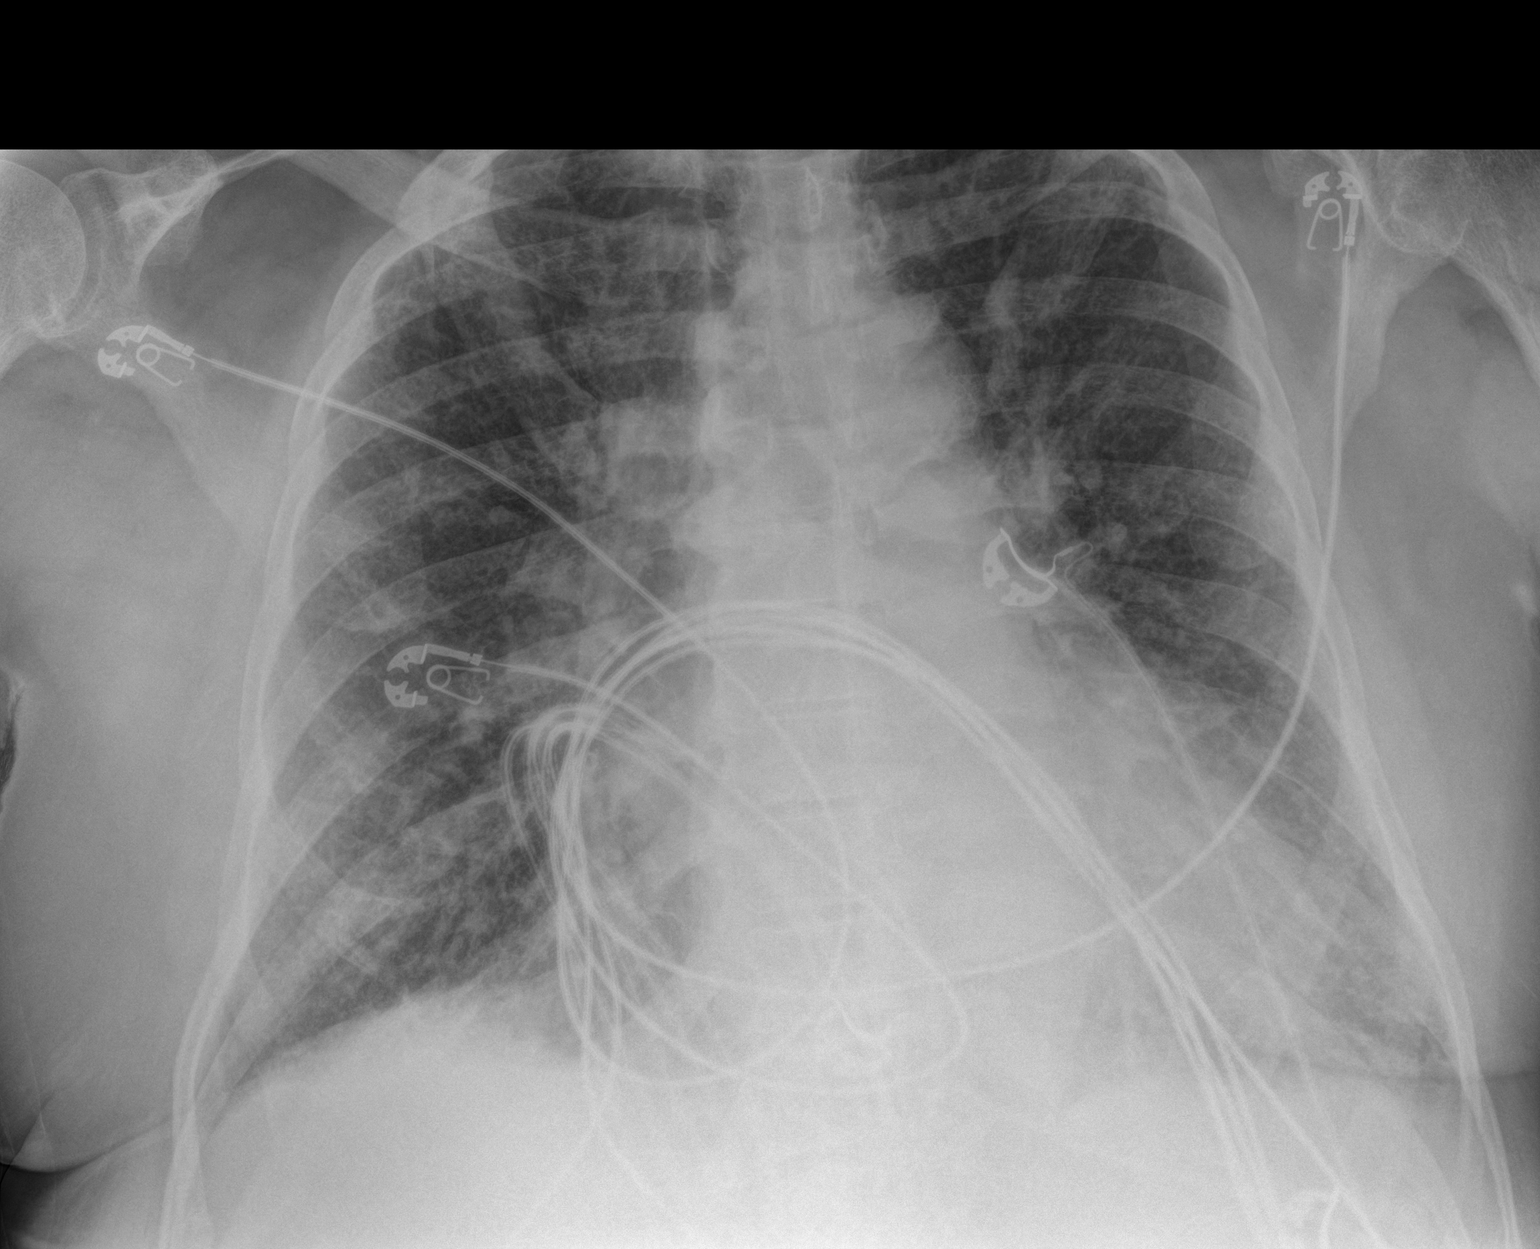

[1 of 1 positions shown; findings below may reference images not displayed]

FINDINGS: Enlargement of cardiac silhouette.

Atherosclerotic calcification of a mildly tortuous thoracic aorta.

Slight pulmonary vascular congestion.

Scattered interstitial infiltrates new since previous exam question
edema versus infection.

No pleural effusion or pneumothorax.

Scattered endplate spur formation thoracic spine.
IMPRESSION: Enlargement of cardiac silhouette with pulmonary vascular congestion
and scattered interstitial infiltrates which could represent
pulmonary edema or infection, new since previous study.

## 2016-04-01 ENCOUNTER — Encounter: Payer: Medicare Other | Admitting: Physician Assistant

## 2016-04-01 NOTE — Progress Notes (Signed)
Cardiology Office Note:    Date:  04/01/2016   ID:  Nicholas Caldwell, DOB August 10, 1946, MRN [id number]  PCP:  [facility]  Cardiologist: Dr. Casandra Doffing [name], PA-C)  Electrophysiologist: Dr. Thompson Grayer  Referring MD: [facility], Luvenia Redden*   Chief Complaint  Patient presents with  . Congestive Heart Failure    follow up    History of Present Illness:     Nicholas Caldwell is a 70 y.o. male with a hx of nonischemic cardiomyopathy dx 4/97, systolic HF, AFib/Flutter, HTN, HL, diabetes, tobacco abuse. Cardiac catheterization January 2016 demonstrated mild nonobstructive CAD.   Admitted in 0/26 with a/c systolic HF and elevated Troponin levels 2/2 demand ischemia in the setting of atrial fibrillation/flutter with RVR. CHADS2-VASc=4. He was placed on Xarelto for anticoagulation. He was seen by EP and was not felt to be a candidate for flecainide secondary cardiomyopathy. He was not felt to be a candidate for Tikosyn given his prolonged QT. Success of ablation would be limited due to left atrial size. He was screened for Genetic AF and was not felt to be a good candidate. It was ultimately decided to place him on amiodarone for rhythm control.   He has had transient episodes of AFlutter noted in the office in the past. I have adjusted his Amiodarone. I had him FU with EP to see if he can be considered for RFCA as his arrhythmia may be contributing to his DCM. He saw Dr. Rayann Heman 10/01/15. Apparently he declined ICD at that time. I have reviewed his case again with Dr. Rayann Heman and he felt the patient would not benefit from RFCA of AFlutter as this would likely result in more AFib.  He recommended continued rate control Rx. He is not a candidate for Genetic AF.   I saw him 03/24/16.  He was volume overloaded with associated hypoxia.  I added Metolazone to his medical regimen for a total of 3 doses.  He returns for close FU.     Past Medical History  Diagnosis Date  .  Hypertension   . Diabetes mellitus without complication ([place])   . Hypercholesteremia   . Chronic systolic CHF (congestive heart failure) ([place])   . NICM (nonischemic cardiomyopathy) ([place])     a. Echo 4/16:  EF 25-30%, diffuse HK, mild MR, severe LAE  . CAD (coronary artery disease)     a. LHC 1/16:  mLAD diffuse disease, pLCx mild disease, dLCx with disease but too small for PCI, RCA ok, EF 25-30%  . Tobacco abuse   . Atrial flutter ([place])     a. recurrent AFlutter with RVR;  b. Amiodarone Rx started 4/16  . Prolonged Q-T interval on ECG   . COPD (chronic obstructive pulmonary disease) ([place])   . Persistent atrial fibrillation [facility])     Past Surgical History  Procedure Laterality Date  . Left and right heart catheterization with coronary angiogram N/A 11/06/2014    Procedure: LEFT AND RIGHT HEART CATHETERIZATION WITH CORONARY ANGIOGRAM;  Surgeon: Jettie Booze, MD;  Location: [facility] CATH LAB;  Service: Cardiovascular;  Laterality: N/A;    Current Medications: Outpatient Prescriptions Prior to Visit  Medication Sig Dispense Refill  . albuterol (PROVENTIL HFA;VENTOLIN HFA) 108 (90 BASE) MCG/ACT inhaler Inhale 2 puffs into the lungs every 6 (six) hours as needed for wheezing or shortness of breath. 1 Inhaler 2  . amiodarone (PACERONE) 200 MG tablet Take 1 tablet (200 mg total) by mouth 2 (two) times daily. [place]  tablet 11  . carvedilol (COREG) 25 MG tablet Take 1.5 tablets (37.5 mg total) by mouth 2 (two) times daily with a meal. 90 tablet 11  . diclofenac sodium (VOLTAREN) 1 % GEL Apply 2 g topically 4 (four) times daily. 100 g 0  . digoxin (LANOXIN) 0.125 MG tablet Take 1 tablet (0.125 mg total) by mouth daily. 30 tablet 11  . furosemide (LASIX) 40 MG tablet Take 1.5 tablets (60 mg total) by mouth 2 (two) times daily. 90 tablet 11  . gabapentin (NEURONTIN) 600 MG tablet Take 600 mg by mouth 2 (two) times daily.   6  . glipiZIDE (GLUCOTROL) 10 MG tablet Take 20 mg by mouth 2 (two) times daily  before a meal.    . hydrALAZINE (APRESOLINE) 25 MG tablet Take 1 tablet (25 mg total) by mouth 3 (three) times daily. 90 tablet 11  . isosorbide mononitrate (IMDUR) 30 MG 24 hr tablet Take 1 tablet (30 mg total) by mouth daily. 30 tablet 3  . metFORMIN (GLUCOPHAGE) 1000 MG tablet Take 1,000 mg by mouth 2 (two) times daily with a meal.    . methadone (DOLOPHINE) 10 MG tablet Take 10 mg by mouth daily.  0  . metolazone (ZAROXOLYN) 2.5 MG tablet Take 1 tablet (2.5 mg total) by mouth as directed. Take 1 tablet Tues 5/23, Thursday 5/24 and Saturday 5/26 3 tablet 0  . potassium chloride SA (K-DUR,KLOR-CON) 20 MEQ tablet Take 2 tablets (40 mEq total) by mouth daily. 30 tablet 0  . rivaroxaban (XARELTO) 20 MG TABS tablet Take 1 tablet (20 mg total) by mouth daily with supper. 30 tablet 0  . rosuvastatin (CRESTOR) 20 MG tablet Take 20 mg by mouth daily.    . sacubitril-valsartan (ENTRESTO) 49-51 MG Take 1 tablet by mouth 2 (two) times daily. 60 tablet 11  . spironolactone (ALDACTONE) 25 MG tablet Take 1 tablet (25 mg total) by mouth daily. 30 tablet 11  . Vitamin D, Ergocalciferol, (DRISDOL) 50000 UNITS CAPS capsule Take 50,000 Units by mouth every 7 (seven) days. On Sunday.     No facility-administered medications prior to visit.      Allergies:   Review of patient's allergies indicates no known allergies.   Social History   Social History  . Marital Status: Single    Spouse Name: N/A  . Number of Children: N/A  . Years of Education: N/A   Social History Main Topics  . Smoking status: Former Smoker -- 0.05 packs/day    Types: Cigarettes    Quit date: 08/30/2015  . Smokeless tobacco: Never Used  . Alcohol Use: No  . Drug Use: No  . Sexual Activity: Not on file   Other Topics Concern  . Not on file   Social History Narrative     Family History:  The patient's family history includes Heart disease in his father and mother; Heart failure in his mother; Hypertension in his mother and  sister. There is no history of Heart attack or Stroke.   ROS:   Please see the history of present illness.    ROS All other systems reviewed and are negative.   Physical Exam:    VS:  There were no vitals taken for this visit.   GEN: Well nourished, well developed, in no acute distress HEENT: normal Neck: + JVD, no masses Cardiac: Normal S1/S2,  RRR; no murmurs, rubs, or gallops, 1-2+ bilateral LE edema;     Respiratory:  Decreased breath sounds bilaterally; no wheezing, rhonchi  or rales GI: soft, nontender, nondistended MS: no deformity or atrophy Skin: warm and dry Neuro: No focal deficits  Psych: Alert and oriented x 3, normal affect  Wt Readings from Last 3 Encounters:  03/24/16 285 lb 6.4 oz (129.457 kg)  01/21/16 273 lb 12.8 oz (124.195 kg)  10/09/15 269 lb (122.018 kg)      Studies/Labs Reviewed:     EKG:  EKG is  ordered today.  The ekg ordered today demonstrates    Recent Labs: 08/02/2015: Pro B Natriuretic peptide (BNP) 363.0* 03/24/2016: ALT 11; Brain Natriuretic Peptide 191.4*; BUN 13; Creat 0.76; Hemoglobin 11.9*; Platelets 422*; Potassium 4.4; Sodium 139; TSH 1.32   Recent Lipid Panel    Component Value Date/Time   CHOL 147 11/01/2014 0216   TRIG 134 11/01/2014 0216   HDL 48 11/01/2014 0216   CHOLHDL 3.1 11/01/2014 0216   VLDL 27 11/01/2014 0216   LDLCALC 72 11/01/2014 0216    Additional studies/ records that were reviewed today include:   Echo 02/22/15 EF 25-30%, diffuse HK, mild MR, severe LAE  LHC 11/06/14 LM: Patent LAD: Mid vessel moderate diffuse disease LCx: Mild proximal disease. Distal disease - vessel too small for PCI RCA: Patent EF 25-30%, LVEDP 26 Conclusion: Mild diffuse non-obstructive CAD; NICM  Myoview 11/02/14 IMPRESSION: 1. Inferoapical scar/infarction. No findings for myocardial ischemia. 2. Mild global hypokinesis and no contraction or wall thickening in the area of scar. 3. Left ventricular ejection fraction  32% 4. High-risk stress test findings*.   ASSESSMENT:     1. Acute on chronic systolic CHF (congestive heart failure) ([place])   2. Nonischemic cardiomyopathy ([place])   3. Coronary artery disease involving native coronary artery of native heart without angina pectoris   4. Paroxysmal atrial fibrillation (H[facility])   5. QT prolongation   6. Essential hypertension   7. Hyperlipidemia     PLAN:     In order of problems listed above:  1. A/C systolic CHF (congestive heart failure) - When last seen, weights were up 7-12 lbs. (depending on scale).  I added Metolazone x total of 3 doses and had him return today for close FU.    Continue beta-blocker, Spironolactone, Entresto 49/51, Imdur, Hydralazine, Digoxin.    2. NICM (nonischemic cardiomyopathy): Question if this is related to tachycardia. He has seen Dr. Rayann Heman. Med Rx was previously planned. Notes indicate the patient did not want any procedures.  repeat Echo.    3. Coronary artery disease - Nonobstructive coronary artery disease by previous cardiac catheterization.  Continue aspirin, statin.    4. Atrial fibrillation and flutter: Continue Xarelto. EP has recommended rate control/ant-arrhythmic drug therapy.  RFCA for AFlutter would result in more AFib.   Continue amiodarone, beta blocker.  I have previously given him paperwork to request PFTs with his PCP at the [facility].    5. Prolonged Q-T interval on ECG: Avoid QT prolonging drugs.   6. Essential hypertension:   7. Hyperlipidemia: Continue statin.     Medication Adjustments/Labs and Tests Ordered: Current medicines are reviewed at length with the patient today.  Concerns regarding medicines are outlined above.  Medication changes, Labs and Tests ordered today are outlined in the Patient Instructions noted below. There are no Patient Instructions on file for this visit. Signed, [name], PA-C  04/01/2016 1:19 PM    [facility] [place],  [place],   [address] Phone: [phone]; Fax: [phone]     This encounter was created  in error - please disregard.

## 2016-04-04 IMAGING — CR DG CHEST 2V
2 series · 2 of 2 positions shown · non-contrast
Comparison: October 26, 2014.

CLINICAL DATA: Dyspnea.

EXAM:
CHEST  2 VIEW

[w chest pa]
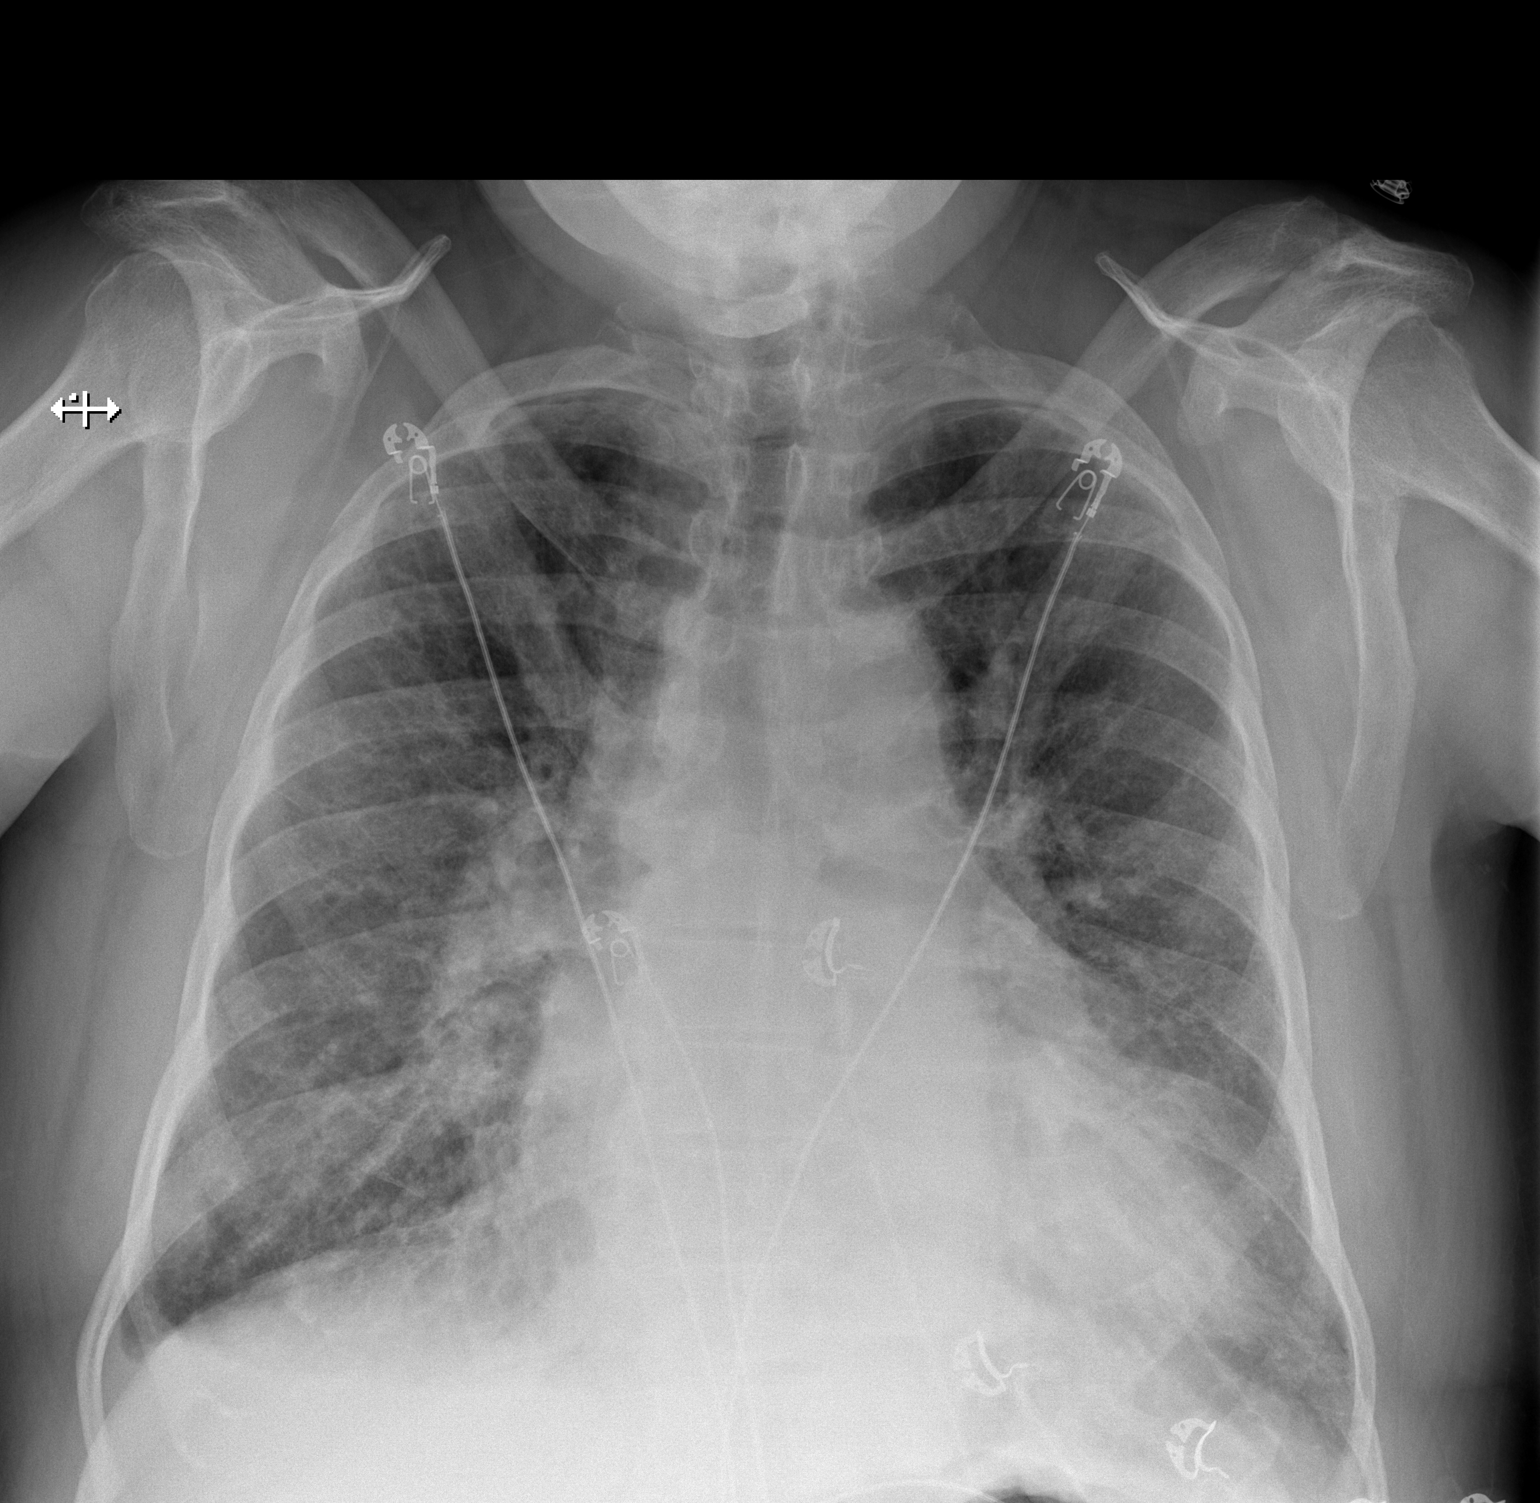

[w chest lat]
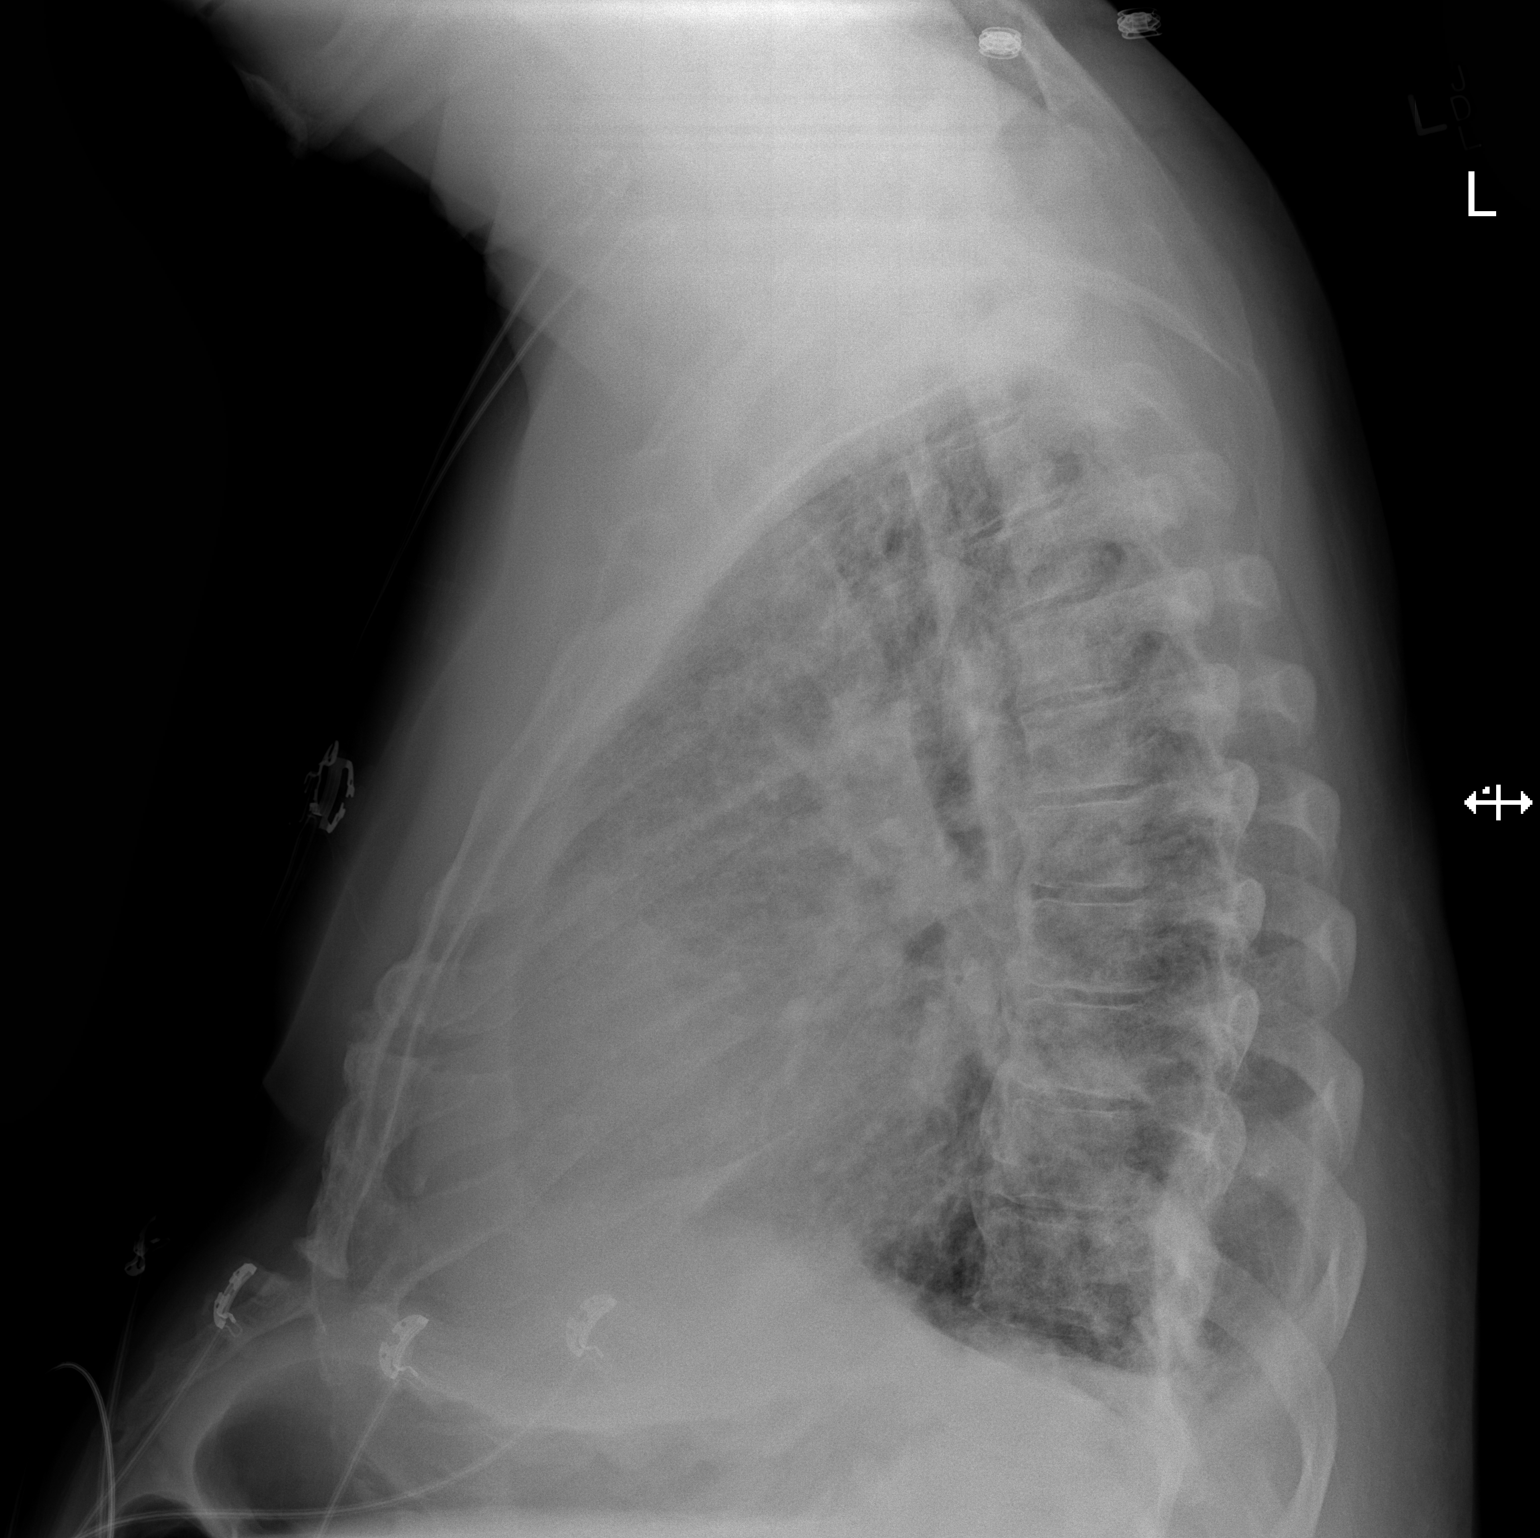

[2 of 2 positions shown; findings below may reference images not displayed]

FINDINGS: Stable cardiomegaly with central pulmonary vascular congestion.
Stable interstitial densities are noted throughout both lungs which
may represent scarring, but superimposed pulmonary edema cannot be
excluded. No pneumothorax is noted. Minimal bilateral pleural
effusions are noted. Bony thorax is intact.
IMPRESSION: Stable cardiomegaly with stable central pulmonary vascular
congestion. Stable interstitial densities are noted throughout both
lungs which may represent scarring, but superimposed pulmonary edema
cannot be excluded.

## 2016-04-07 IMAGING — CR DG CHEST 1V PORT
1 series · 1 of 1 positions shown · non-contrast
Comparison: 10/31/2014

CLINICAL DATA: Heart failure.  Shortness of breath, cough.

EXAM:
PORTABLE CHEST - 1 VIEW

[AP]
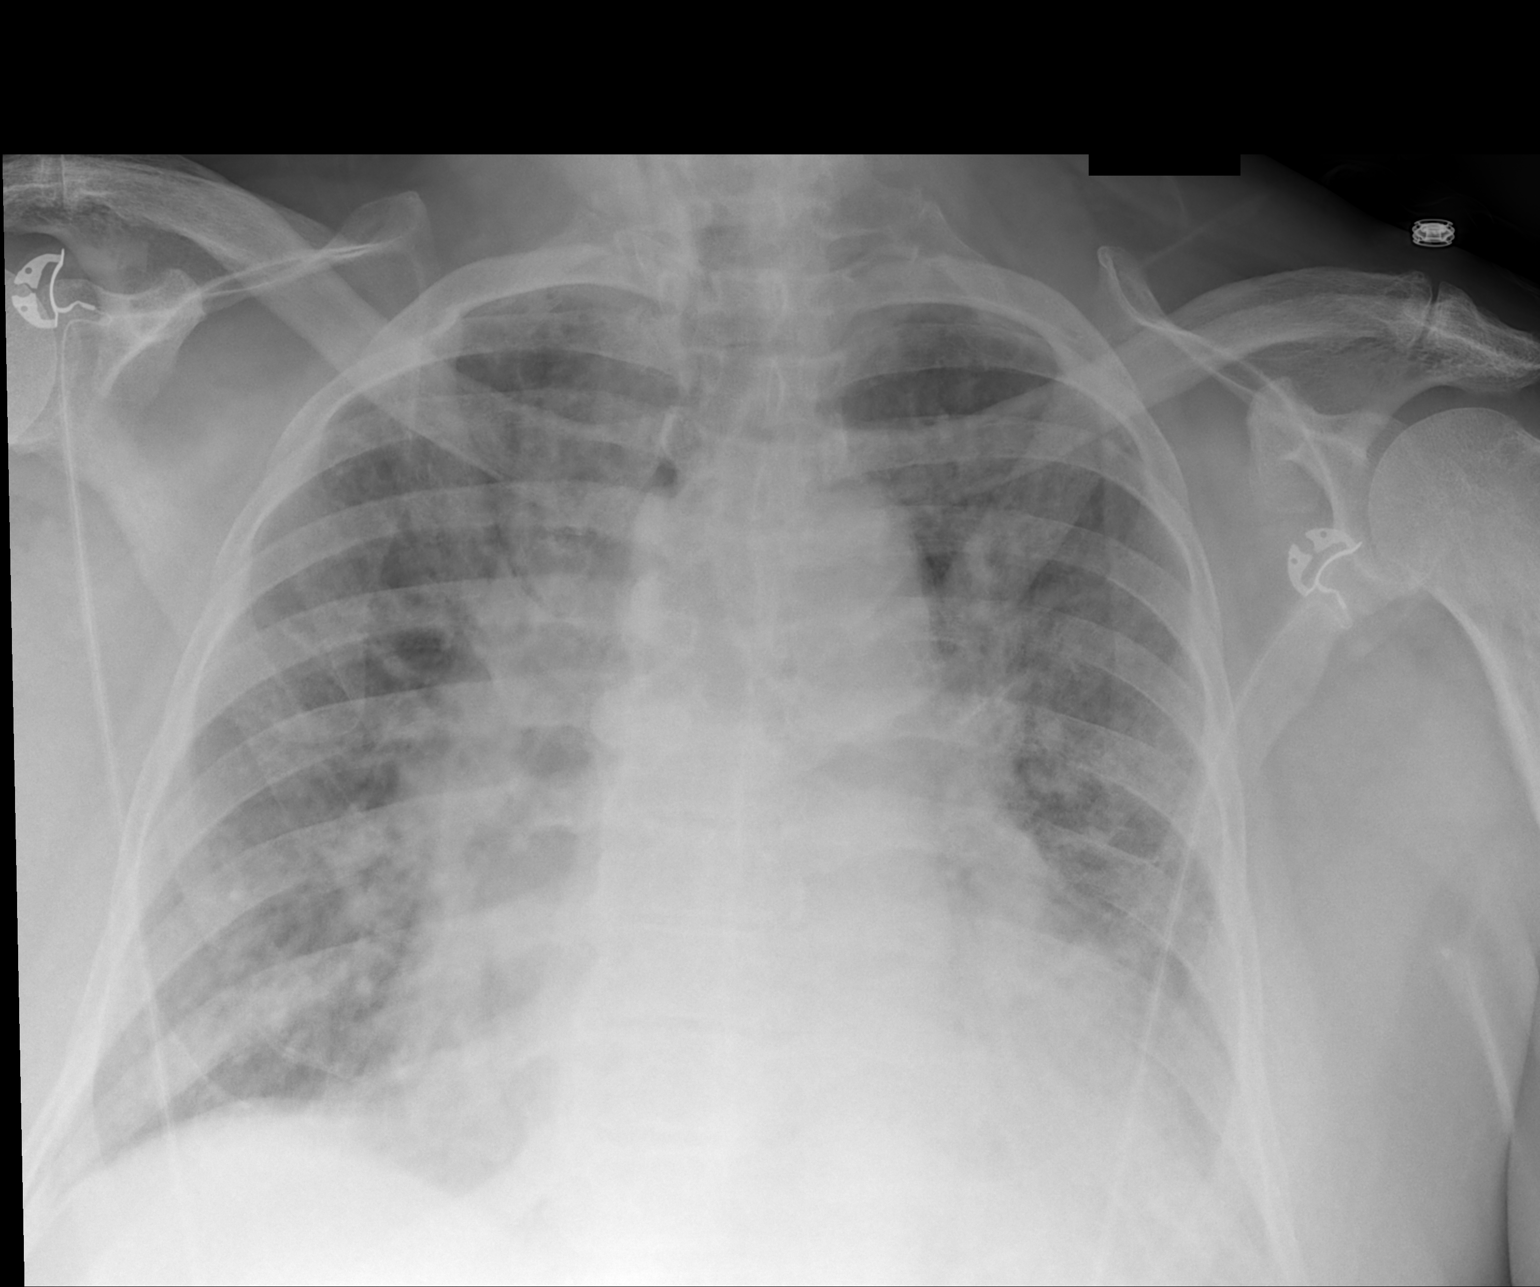

[1 of 1 positions shown; findings below may reference images not displayed]

FINDINGS: Cardiomegaly with vascular congestion and diffuse interstitial
prominence. I favor this represents interstitial edema although a
component of underlying chronic interstitial lung disease is
possible. No visible effusions or acute bony abnormality.
IMPRESSION: Cardiomegaly with stable diffuse interstitial prominence concerning
for interstitial edema. Cannot exclude underlying chronic
interstitial lung disease.

## 2016-04-08 ENCOUNTER — Telehealth: Payer: Self-pay | Admitting: *Deleted

## 2016-04-08 ENCOUNTER — Encounter: Payer: Self-pay | Admitting: *Deleted

## 2016-04-08 NOTE — Telephone Encounter (Signed)
Lmtcb to reschedule appt with Brynda Rim. PA. Pt no showed appt for PA on 04/01/16. Per Brynda Rim PA I will also mail out letter today to pt.

## 2016-04-14 DIAGNOSIS — G894 Chronic pain syndrome: Secondary | ICD-10-CM | POA: Diagnosis not present

## 2016-04-14 DIAGNOSIS — M25559 Pain in unspecified hip: Secondary | ICD-10-CM | POA: Diagnosis not present

## 2016-04-14 DIAGNOSIS — M545 Low back pain: Secondary | ICD-10-CM | POA: Diagnosis not present

## 2016-04-14 DIAGNOSIS — M792 Neuralgia and neuritis, unspecified: Secondary | ICD-10-CM | POA: Diagnosis not present

## 2016-04-14 DIAGNOSIS — Z79891 Long term (current) use of opiate analgesic: Secondary | ICD-10-CM | POA: Diagnosis not present

## 2016-04-14 DIAGNOSIS — Z79899 Other long term (current) drug therapy: Secondary | ICD-10-CM | POA: Diagnosis not present

## 2016-04-28 ENCOUNTER — Encounter: Payer: Self-pay | Admitting: Physician Assistant

## 2016-04-29 ENCOUNTER — Telehealth: Payer: Self-pay | Admitting: *Deleted

## 2016-04-29 NOTE — Telephone Encounter (Signed)
Lmtcb x 4 and sent letter asking for ptcb to go over results and to reschedule appt with Brynda Rim. PA. Pt was a NOS on 04/01/16 for [name], PA.

## 2016-05-14 DIAGNOSIS — G894 Chronic pain syndrome: Secondary | ICD-10-CM | POA: Diagnosis not present

## 2016-05-14 DIAGNOSIS — M25559 Pain in unspecified hip: Secondary | ICD-10-CM | POA: Diagnosis not present

## 2016-05-14 DIAGNOSIS — Z79891 Long term (current) use of opiate analgesic: Secondary | ICD-10-CM | POA: Diagnosis not present

## 2016-05-14 DIAGNOSIS — M533 Sacrococcygeal disorders, not elsewhere classified: Secondary | ICD-10-CM | POA: Diagnosis not present

## 2016-05-14 DIAGNOSIS — Z79899 Other long term (current) drug therapy: Secondary | ICD-10-CM | POA: Diagnosis not present

## 2016-05-14 DIAGNOSIS — M792 Neuralgia and neuritis, unspecified: Secondary | ICD-10-CM | POA: Diagnosis not present

## 2016-05-14 DIAGNOSIS — M545 Low back pain: Secondary | ICD-10-CM | POA: Diagnosis not present

## 2016-05-25 ENCOUNTER — Encounter (HOSPITAL_COMMUNITY): Payer: Self-pay

## 2016-05-25 ENCOUNTER — Emergency Department (HOSPITAL_COMMUNITY): Payer: Medicare Other

## 2016-05-25 ENCOUNTER — Inpatient Hospital Stay (HOSPITAL_COMMUNITY)
Admission: EM | Admit: 2016-05-25 | Discharge: 2016-05-26 | DRG: 190 | Disposition: A | Discharge status: Home / Self Care | Payer: Medicare Other | Attending: Family Medicine | Admitting: Family Medicine

## 2016-05-25 DIAGNOSIS — Z794 Long term (current) use of insulin: Secondary | ICD-10-CM

## 2016-05-25 DIAGNOSIS — E1140 Type 2 diabetes mellitus with diabetic neuropathy, unspecified: Secondary | ICD-10-CM | POA: Diagnosis present

## 2016-05-25 DIAGNOSIS — I4892 Unspecified atrial flutter: Secondary | ICD-10-CM | POA: Diagnosis present

## 2016-05-25 DIAGNOSIS — I5023 Acute on chronic systolic (congestive) heart failure: Secondary | ICD-10-CM | POA: Diagnosis present

## 2016-05-25 DIAGNOSIS — Z8249 Family history of ischemic heart disease and other diseases of the circulatory system: Secondary | ICD-10-CM

## 2016-05-25 DIAGNOSIS — I252 Old myocardial infarction: Secondary | ICD-10-CM | POA: Diagnosis not present

## 2016-05-25 DIAGNOSIS — Z6837 Body mass index (BMI) 37.0-37.9, adult: Secondary | ICD-10-CM | POA: Diagnosis not present

## 2016-05-25 DIAGNOSIS — J441 Chronic obstructive pulmonary disease with (acute) exacerbation: Secondary | ICD-10-CM | POA: Diagnosis present

## 2016-05-25 DIAGNOSIS — I428 Other cardiomyopathies: Secondary | ICD-10-CM | POA: Diagnosis present

## 2016-05-25 DIAGNOSIS — R0902 Hypoxemia: Secondary | ICD-10-CM

## 2016-05-25 DIAGNOSIS — J9601 Acute respiratory failure with hypoxia: Secondary | ICD-10-CM | POA: Diagnosis present

## 2016-05-25 DIAGNOSIS — E7800 Pure hypercholesterolemia, unspecified: Secondary | ICD-10-CM | POA: Diagnosis present

## 2016-05-25 DIAGNOSIS — E1159 Type 2 diabetes mellitus with other circulatory complications: Secondary | ICD-10-CM | POA: Diagnosis present

## 2016-05-25 DIAGNOSIS — I2510 Atherosclerotic heart disease of native coronary artery without angina pectoris: Secondary | ICD-10-CM | POA: Diagnosis present

## 2016-05-25 DIAGNOSIS — Z7984 Long term (current) use of oral hypoglycemic drugs: Secondary | ICD-10-CM

## 2016-05-25 DIAGNOSIS — Z7901 Long term (current) use of anticoagulants: Secondary | ICD-10-CM | POA: Diagnosis not present

## 2016-05-25 DIAGNOSIS — J9602 Acute respiratory failure with hypercapnia: Secondary | ICD-10-CM | POA: Diagnosis present

## 2016-05-25 DIAGNOSIS — Z79891 Long term (current) use of opiate analgesic: Secondary | ICD-10-CM | POA: Diagnosis not present

## 2016-05-25 DIAGNOSIS — I481 Persistent atrial fibrillation: Secondary | ICD-10-CM | POA: Diagnosis present

## 2016-05-25 DIAGNOSIS — I480 Paroxysmal atrial fibrillation: Secondary | ICD-10-CM | POA: Diagnosis present

## 2016-05-25 DIAGNOSIS — I4581 Long QT syndrome: Secondary | ICD-10-CM | POA: Diagnosis present

## 2016-05-25 DIAGNOSIS — G4730 Sleep apnea, unspecified: Secondary | ICD-10-CM | POA: Diagnosis present

## 2016-05-25 DIAGNOSIS — R05 Cough: Secondary | ICD-10-CM | POA: Diagnosis not present

## 2016-05-25 DIAGNOSIS — Z87891 Personal history of nicotine dependence: Secondary | ICD-10-CM | POA: Diagnosis not present

## 2016-05-25 DIAGNOSIS — R0602 Shortness of breath: Secondary | ICD-10-CM | POA: Diagnosis not present

## 2016-05-25 DIAGNOSIS — I10 Essential (primary) hypertension: Secondary | ICD-10-CM | POA: Diagnosis present

## 2016-05-25 DIAGNOSIS — I110 Hypertensive heart disease with heart failure: Secondary | ICD-10-CM | POA: Diagnosis present

## 2016-05-25 LAB — CBC
HCT: 39.3 % (ref 39.0–52.0)
HEMOGLOBIN: 12.3 g/dL — AB (ref 13.0–17.0)
MCH: 26.6 pg (ref 26.0–34.0)
MCHC: 31.3 g/dL (ref 30.0–36.0)
MCV: 85.1 fL (ref 78.0–100.0)
Platelets: 418 10*3/uL — ABNORMAL HIGH (ref 150–400)
RBC: 4.62 MIL/uL (ref 4.22–5.81)
RDW: 15.7 % — ABNORMAL HIGH (ref 11.5–15.5)
WBC: 9.4 10*3/uL (ref 4.0–10.5)

## 2016-05-25 LAB — BASIC METABOLIC PANEL
ANION GAP: 6 (ref 5–15)
BUN: 14 mg/dL (ref 6–20)
CALCIUM: 8.6 mg/dL — AB (ref 8.9–10.3)
CO2: 33 mmol/L — ABNORMAL HIGH (ref 22–32)
Chloride: 99 mmol/L — ABNORMAL LOW (ref 101–111)
Creatinine, Ser: 0.71 mg/dL (ref 0.61–1.24)
Glucose, Bld: 192 mg/dL — ABNORMAL HIGH (ref 65–99)
Potassium: 3.9 mmol/L (ref 3.5–5.1)
Sodium: 138 mmol/L (ref 135–145)

## 2016-05-25 LAB — I-STAT TROPONIN, ED: TROPONIN I, POC: 0.02 ng/mL (ref 0.00–0.08)

## 2016-05-25 LAB — BRAIN NATRIURETIC PEPTIDE: B NATRIURETIC PEPTIDE 5: 774.6 pg/mL — AB (ref 0.0–100.0)

## 2016-05-25 LAB — GLUCOSE, CAPILLARY: Glucose-Capillary: 266 mg/dL — ABNORMAL HIGH (ref 65–99)

## 2016-05-25 MED ORDER — SACUBITRIL-VALSARTAN 49-51 MG PO TABS
1.0000 | ORAL_TABLET | Freq: Two times a day (BID) | ORAL | Status: DC
  Administered 2016-05-26 (×2): 1 via ORAL
  Filled 2016-05-25 (×2): qty 1

## 2016-05-25 MED ORDER — ALBUTEROL SULFATE (2.5 MG/3ML) 0.083% IN NEBU: 2.5000 mg | INHALATION_SOLUTION | RESPIRATORY_TRACT | Status: DC | PRN

## 2016-05-25 MED ORDER — PREDNISONE 20 MG PO TABS
60.0000 mg | ORAL_TABLET | Freq: Once | ORAL | Status: AC
  Administered 2016-05-25: 60 mg via ORAL
  Filled 2016-05-25: qty 3

## 2016-05-25 MED ORDER — SODIUM CHLORIDE 0.9% FLUSH
3.0000 mL | Freq: Two times a day (BID) | INTRAVENOUS | Status: DC
  Administered 2016-05-26 (×2): 3 mL via INTRAVENOUS

## 2016-05-25 MED ORDER — RIVAROXABAN 20 MG PO TABS
20.0000 mg | ORAL_TABLET | Freq: Every day | ORAL | Status: DC
  Filled 2016-05-25: qty 1

## 2016-05-25 MED ORDER — HYDRALAZINE HCL 25 MG PO TABS
25.0000 mg | ORAL_TABLET | Freq: Three times a day (TID) | ORAL | Status: DC
  Administered 2016-05-26 (×2): 25 mg via ORAL
  Filled 2016-05-25 (×2): qty 1

## 2016-05-25 MED ORDER — ALBUTEROL SULFATE (2.5 MG/3ML) 0.083% IN NEBU
5.0000 mg | INHALATION_SOLUTION | Freq: Once | RESPIRATORY_TRACT | Status: AC
  Administered 2016-05-25: 5 mg via RESPIRATORY_TRACT
  Filled 2016-05-25: qty 6

## 2016-05-25 MED ORDER — ACETAMINOPHEN 325 MG PO TABS: 650.0000 mg | ORAL_TABLET | Freq: Four times a day (QID) | ORAL | Status: DC | PRN

## 2016-05-25 MED ORDER — DOXYCYCLINE HYCLATE 100 MG PO TABS
100.0000 mg | ORAL_TABLET | Freq: Once | ORAL | Status: AC
  Administered 2016-05-25: 100 mg via ORAL
  Filled 2016-05-25: qty 1

## 2016-05-25 MED ORDER — IPRATROPIUM-ALBUTEROL 0.5-2.5 (3) MG/3ML IN SOLN
3.0000 mL | Freq: Once | RESPIRATORY_TRACT | Status: AC
  Administered 2016-05-25: 3 mL via RESPIRATORY_TRACT
  Filled 2016-05-25: qty 3

## 2016-05-25 MED ORDER — ISOSORBIDE MONONITRATE ER 30 MG PO TB24
30.0000 mg | ORAL_TABLET | Freq: Every day | ORAL | Status: DC
  Administered 2016-05-26: 30 mg via ORAL
  Filled 2016-05-25: qty 1

## 2016-05-25 MED ORDER — DOXYCYCLINE HYCLATE 100 MG PO TABS
100.0000 mg | ORAL_TABLET | Freq: Two times a day (BID) | ORAL | Status: DC
  Administered 2016-05-26: 100 mg via ORAL
  Filled 2016-05-25: qty 1

## 2016-05-25 MED ORDER — DEXTROSE 5 % IV SOLN
1.0000 g | Freq: Once | INTRAVENOUS | Status: AC
  Administered 2016-05-25: 1 g via INTRAVENOUS
  Filled 2016-05-25: qty 10

## 2016-05-25 MED ORDER — ALBUTEROL SULFATE (2.5 MG/3ML) 0.083% IN NEBU
2.5000 mg | INHALATION_SOLUTION | Freq: Four times a day (QID) | RESPIRATORY_TRACT | Status: DC
  Filled 2016-05-25: qty 3

## 2016-05-25 MED ORDER — SPIRONOLACTONE 25 MG PO TABS
25.0000 mg | ORAL_TABLET | Freq: Every day | ORAL | Status: DC
  Administered 2016-05-26: 25 mg via ORAL
  Filled 2016-05-25: qty 1

## 2016-05-25 MED ORDER — PREDNISONE 20 MG PO TABS
40.0000 mg | ORAL_TABLET | Freq: Every day | ORAL | Status: DC
Start: 2016-05-26 — End: 2016-05-26
  Administered 2016-05-26: 40 mg via ORAL
  Filled 2016-05-25: qty 2

## 2016-05-25 MED ORDER — FUROSEMIDE 10 MG/ML IJ SOLN
80.0000 mg | INTRAMUSCULAR | Status: DC
  Administered 2016-05-26 (×2): 80 mg via INTRAVENOUS
  Filled 2016-05-25 (×2): qty 8

## 2016-05-25 MED ORDER — INSULIN ASPART 100 UNIT/ML ~~LOC~~ SOLN
0.0000 [IU] | Freq: Three times a day (TID) | SUBCUTANEOUS | Status: DC
  Administered 2016-05-26 (×2): 5 [IU] via SUBCUTANEOUS

## 2016-05-25 MED ORDER — DICLOFENAC SODIUM 1 % TD GEL
2.0000 g | Freq: Four times a day (QID) | TRANSDERMAL | Status: DC
  Administered 2016-05-26 (×2): 2 g via TOPICAL
  Filled 2016-05-25: qty 100

## 2016-05-25 MED ORDER — OXYCODONE HCL 5 MG PO TABS
5.0000 mg | ORAL_TABLET | ORAL | Status: DC | PRN
  Administered 2016-05-26 (×2): 5 mg via ORAL
  Filled 2016-05-25 (×2): qty 1

## 2016-05-25 MED ORDER — ROSUVASTATIN CALCIUM 20 MG PO TABS: 20.0000 mg | ORAL_TABLET | Freq: Every day | ORAL | Status: DC

## 2016-05-25 MED ORDER — POTASSIUM CHLORIDE CRYS ER 20 MEQ PO TBCR
40.0000 meq | EXTENDED_RELEASE_TABLET | Freq: Every day | ORAL | Status: DC
  Administered 2016-05-26: 40 meq via ORAL
  Filled 2016-05-25: qty 2

## 2016-05-25 MED ORDER — GABAPENTIN 300 MG PO CAPS
600.0000 mg | ORAL_CAPSULE | Freq: Two times a day (BID) | ORAL | Status: DC
  Administered 2016-05-26 (×2): 600 mg via ORAL
  Filled 2016-05-25 (×2): qty 2

## 2016-05-25 MED ORDER — FUROSEMIDE 10 MG/ML IJ SOLN
40.0000 mg | Freq: Once | INTRAMUSCULAR | Status: DC
  Filled 2016-05-25: qty 4

## 2016-05-25 MED ORDER — DIGOXIN 125 MCG PO TABS
0.1250 mg | ORAL_TABLET | Freq: Every day | ORAL | Status: DC
  Administered 2016-05-26: 0.125 mg via ORAL
  Filled 2016-05-25: qty 1

## 2016-05-25 MED ORDER — CARVEDILOL 25 MG PO TABS
37.5000 mg | ORAL_TABLET | Freq: Two times a day (BID) | ORAL | Status: DC
  Administered 2016-05-26: 37.5 mg via ORAL
  Filled 2016-05-25: qty 1

## 2016-05-25 MED ORDER — ACETAMINOPHEN 650 MG RE SUPP: 650.0000 mg | Freq: Four times a day (QID) | RECTAL | Status: DC | PRN

## 2016-05-25 NOTE — ED Triage Notes (Signed)
Pt c/o increasing SOB, congested cough, and central chest pain x 4 days.  Pain score 8/10.  Hx of COPD, CHF, and CAD.  Pt reports using a inhaler w/o relief.

## 2016-05-25 NOTE — ED Notes (Signed)
Hospitalist at bedside to evaluate pt. RT notified per hospitalist.

## 2016-05-25 NOTE — ED Provider Notes (Addendum)
[facility] DEPT Provider Note   CSN: [id number] Arrival date & time: 05/25/16  1759  First Provider Contact:  First MD Initiated Contact with Patient 05/25/16 1904        History   Chief Complaint Chief Complaint  Patient presents with  . Shortness of Breath  . Cough    HPI Nicholas Caldwell is a 70 y.o. male presenting with a chief complaint of shortness of breath and cough. Patient states occasionally his been having yellow sputum. Overall this is been for 2 weeks but got significantly worse last night. No chest pain. Has been having bilateral lower extremity edema for about one month. Shortness of breath is worse when lying flat. Had fevers last week but no fevers currently. Has been trying albuterol with no relief.  HPI  Past Medical History:  Diagnosis Date  . Atrial flutter ([place])    a. recurrent AFlutter with RVR;  b. Amiodarone Rx started 4/16  . CAD (coronary artery disease)    a. LHC 1/16:  mLAD diffuse disease, pLCx mild disease, dLCx with disease but too small for PCI, RCA ok, EF 25-30%  . Chronic systolic CHF (congestive heart failure) ([place])   . COPD (chronic obstructive pulmonary disease) ([place])   . Diabetes mellitus without complication ([place])   . Hypercholesteremia   . Hypertension   . NICM (nonischemic cardiomyopathy) ([place])    a. Echo 4/16:  EF 25-30%, diffuse HK, mild MR, severe LAE  . Persistent atrial fibrillation ([place])   . Prolonged Q-T interval on ECG   . Tobacco abuse     Patient Active Problem List   Diagnosis Date Noted  . CAD (coronary artery disease) - Non-obstructive by LHC1/16 01/21/2016  . Atrial fibrillation ([place]) 10/09/2015  . QT prolongation 10/09/2015  . Nonischemic cardiomyopathy ([place]) 10/09/2015  . Persistent atrial fibrillation ([place])   . Atrial flutter with rapid ventricular response ([place]) 02/20/2015  . Acute respiratory failure ([place]) 02/19/2015  . Acute respiratory acidosis 02/19/2015  . Chronic pain 02/19/2015  . Prolonged Q-T  interval on ECG 02/19/2015  . Acute encephalopathy 02/19/2015  . NSTEMI (non-ST elevated myocardial infarction) ([place]) 02/19/2015  . Morbid obesity ([place]) 02/13/2015  . Hypokalemia 11/04/2014  . Chronic systolic CHF (congestive heart failure) ([place]) 11/02/2014  . Pain in the chest   . Dyspnea   . Elevated troponin I level 11/01/2014  . COPD exacerbation ([place]) 11/01/2014  . Chest pain 10/31/2014  . Hypertension 10/31/2014  . Diabetes mellitus type 2 in obese ([place]) 10/31/2014  . Hyperlipidemia associated with type 2 diabetes mellitus ([place]) 10/31/2014  . Smoker 10/31/2014    Past Surgical History:  Procedure Laterality Date  . LEFT AND RIGHT HEART CATHETERIZATION WITH CORONARY ANGIOGRAM N/A 11/06/2014   Procedure: LEFT AND RIGHT HEART CATHETERIZATION WITH CORONARY ANGIOGRAM;  Surgeon: Jettie Booze, MD;  Location: [facility] CATH LAB;  Service: Cardiovascular;  Laterality: N/A;       Home Medications    Prior to Admission medications   Medication Sig Start Date End Date Taking? Authorizing Provider  albuterol (PROVENTIL HFA;VENTOLIN HFA) 108 (90 BASE) MCG/ACT inhaler Inhale 2 puffs into the lungs every 6 (six) hours as needed for wheezing or shortness of breath. 11/07/14  Yes Kinnie Feil, MD  carvedilol (COREG) 25 MG tablet Take 1.5 tablets (37.5 mg total) by mouth 2 (two) times daily with a meal. 01/21/16  Yes Ellora Varnum T Kathlen Mody, PA-C  diazepam (VALIUM) 10 MG tablet Take 10 mg by mouth at bedtime.   Yes  Historical Provider, MD  diclofenac sodium (VOLTAREN) 1 % GEL Apply 2 g topically 4 (four) times daily. 02/23/15  Yes Luan Moore, MD  digoxin (LANOXIN) 0.125 MG tablet Take 1 tablet (0.125 mg total) by mouth daily. 09/12/15  Yes Devian Bartolomei T Kathlen Mody, PA-C  furosemide (LASIX) 40 MG tablet Take 80 mg by mouth 2 (two) times daily.   Yes Historical Provider, MD  gabapentin (NEURONTIN) 600 MG tablet Take 600 mg by mouth 2 (two) times daily.    Yes Historical Provider, MD  glipiZIDE (GLUCOTROL) 10 MG  tablet Take 20 mg by mouth 2 (two) times daily before a meal.   Yes Historical Provider, MD  hydrALAZINE (APRESOLINE) 25 MG tablet Take 1 tablet (25 mg total) by mouth 3 (three) times daily. 09/12/15  Yes Jeidi Gilles Joylene Draft, PA-C  isosorbide mononitrate (IMDUR) 30 MG 24 hr tablet Take 1 tablet (30 mg total) by mouth daily. 10/09/15  Yes Jettie Booze, MD  metFORMIN (GLUCOPHAGE) 1000 MG tablet Take 1,000 mg by mouth 2 (two) times daily with a meal.   Yes Historical Provider, MD  methadone (DOLOPHINE) 10 MG tablet Take 10 mg by mouth at bedtime.    Yes Historical Provider, MD  metolazone (ZAROXOLYN) 2.5 MG tablet Take 2.5 mg by mouth daily.   Yes Historical Provider, MD  potassium chloride SA (K-DUR,KLOR-CON) 20 MEQ tablet Take 2 tablets (40 mEq total) by mouth daily. 11/07/14  Yes Kinnie Feil, MD  rivaroxaban (XARELTO) 20 MG TABS tablet Take 1 tablet (20 mg total) by mouth daily with supper. 02/23/15  Yes Luan Moore, MD  rosuvastatin (CRESTOR) 20 MG tablet Take 20 mg by mouth daily.   Yes Historical Provider, MD  sacubitril-valsartan (ENTRESTO) 49-51 MG Take 1 tablet by mouth 2 (two) times daily. 09/12/15  Yes Blayne Frankie Joylene Draft, PA-C  spironolactone (ALDACTONE) 25 MG tablet Take 1 tablet (25 mg total) by mouth daily. 08/02/15  Yes Liliane Shi, PA-C  Vitamin D, Ergocalciferol, (DRISDOL) 50000 UNITS CAPS capsule Take 50,000 Units by mouth every 7 (seven) days. Pt takes on Sunday.   Yes Historical Provider, MD  amiodarone (PACERONE) 200 MG tablet Take 1 tablet (200 mg total) by mouth 2 (two) times daily. Patient not taking: Reported on 05/25/2016 01/21/16   Liliane Shi, PA-C    Family History Family History  Problem Relation Age of Onset  . Heart disease Mother   . Hypertension Mother   . Heart failure Mother   . Heart disease Father   . Hypertension Sister   . Heart attack Neg Hx   . Stroke Neg Hx     Social History Social History  Substance Use Topics  . Smoking status: Former Smoker      Packs/day: 0.05    Types: Cigarettes    Quit date: 08/30/2015  . Smokeless tobacco: Never Used  . Alcohol use No     Allergies   Review of patient's allergies indicates no known allergies.   Review of Systems Review of Systems  Constitutional: Negative for fever.  Respiratory: Positive for cough and shortness of breath.   Cardiovascular: Positive for leg swelling. Negative for chest pain.  All other systems reviewed and are negative.    Physical Exam Updated Vital Signs BP 135/74   Pulse 88   Temp 98.4 F (36.9 C) (Oral)   Resp 16   Wt 260 lb (117.9 kg)   SpO2 100%   BMI 35.26 kg/m   Physical Exam  Constitutional: He is  oriented to person, place, and time. He appears well-developed and well-nourished.  HENT:  Head: Normocephalic and atraumatic.  Right Ear: External ear normal.  Left Ear: External ear normal.  Nose: Nose normal.  Eyes: Right eye exhibits no discharge. Left eye exhibits no discharge.  Neck: Neck supple.  Cardiovascular: Normal rate, regular rhythm, normal heart sounds and intact distal pulses.   Pulses:      Dorsalis pedis pulses are 2+ on the right side, and 2+ on the left side.  Pulmonary/Chest: No accessory muscle usage. Tachypnea noted. No respiratory distress. He has wheezes (diffuse, expiratory).  Abdominal: Soft. There is no tenderness.  Musculoskeletal: He exhibits edema (BLE, 3+).  Neurological: He is alert and oriented to person, place, and time.  Skin: Skin is warm and dry.  Nursing note and vitals reviewed.    ED Treatments / Results  Labs (all labs ordered are listed, but only abnormal results are displayed) Labs Reviewed  BASIC METABOLIC PANEL - Abnormal; Notable for the following:       Result Value   Chloride 99 (*)    CO2 33 (*)    Glucose, Bld 192 (*)    Calcium 8.6 (*)    All other components within normal limits  CBC - Abnormal; Notable for the following:    Hemoglobin 12.3 (*)    RDW 15.7 (*)    Platelets 418  (*)    All other components within normal limits  BRAIN NATRIURETIC PEPTIDE - Abnormal; Notable for the following:    B Natriuretic Peptide 774.6 (*)    All other components within normal limits  BLOOD GAS, ARTERIAL - Abnormal; Notable for the following:    pCO2 arterial 61.3 (*)    pO2, Arterial 55.6 (*)    Bicarbonate 33.9 (*)    Acid-Base Excess 7.4 (*)    All other components within normal limits  CULTURE, BLOOD (ROUTINE X 2)  CULTURE, BLOOD (ROUTINE X 2)  I-STAT TROPOININ, ED    EKG  EKG Interpretation  Date/Time:  Sunday May 25 2016 18:20:03 EDT Ventricular Rate:  89 PR Interval:    QRS Duration: 135 QT Interval:  434 QTC Calculation: 529 R Axis:   103 Text Interpretation:  Atrial flutter with predominant 3:1 AV block Nonspecific intraventricular conduction delay Borderline repolarization abnormality a flutter new compared to most recent ECG Confirmed by Mishawn Hemann MD, Tamarra Geiselman [phone]) on 05/25/2016 6:56:22 PM       Radiology Dg Chest 2 View  Result Date: 05/25/2016 CLINICAL DATA:  Productive cough for 2 weeks. EXAM: CHEST  2 VIEW COMPARISON:  PA and lateral chest 02/23/2015 and 10/31/2014. FINDINGS: There is cardiomegaly and vascular congestion. No consolidative process, pneumothorax or effusion is identified. No focal bony abnormality. IMPRESSION: Cardiomegaly and pulmonary vascular congestion. Electronically Signed   By: Inge Rise M.D.   On: 05/25/2016 18:44   Procedures Procedures (including critical care time)  Medications Ordered in ED Medications  furosemide (LASIX) injection 40 mg (not administered)  albuterol (PROVENTIL) (2.5 MG/3ML) 0.083% nebulizer solution 5 mg (5 mg Nebulization Given 05/25/16 1912)  ipratropium-albuterol (DUONEB) 0.5-2.5 (3) MG/3ML nebulizer solution 3 mL (3 mLs Nebulization Given 05/25/16 2031)  predniSONE (DELTASONE) tablet 60 mg (60 mg Oral Given 05/25/16 2031)  cefTRIAXone (ROCEPHIN) 1 g in dextrose 5 % 50 mL IVPB (1 g Intravenous  New Bag/Given 05/25/16 2031)  doxycycline (VIBRA-TABS) tablet 100 mg (100 mg Oral Given 05/25/16 2031)     Initial Impression / Assessment and Plan / ED  Course  I have reviewed the triage vital signs and the nursing notes.  Pertinent labs & imaging results that were available during my care of the patient were reviewed by me and considered in my medical decision making (see chart for details).  Clinical Course  Comment By Time  COPD vs Pneumonia vs CHF. Intermittently hypoxic to low 80s while talking to me. Will give albuterol, steroids, and check labs/CXR.  Sherwood Gambler, MD 07/23 1911  ABG shows elevated CO2 but is compensated, no acidosis. Breathing easier after another breathing treatment. Still drops sats off oxygen, will need admission. Given dyspnea and change in sputum, will treat COPD exacerbation with antibiotics.  Sherwood Gambler, MD 07/23 2116  Dr. Loleta Books to admit. Tele inpatient. Will give 40 mg lasix as well. Sherwood Gambler, MD 07/23 2125  Long discussion with patient and Dr. Loleta Books. Patient was wanting to leav but after long discussion, patient has agreed to stay. He is hopefully will leave tomorrow. Discussed with Dr. Loleta Books, he will change patient's status to observation. Sherwood Gambler, MD 07/23 2227    Final Clinical Impressions(s) / ED Diagnoses   Final diagnoses:  COPD exacerbation ([place])  Hypoxia    New Prescriptions New Prescriptions   No medications on file     Sherwood Gambler, MD 05/25/16 2143    Sherwood Gambler, MD 05/25/16 2227

## 2016-05-25 NOTE — ED Notes (Signed)
Pt's O2 sat will drop into mid 80s and immediately increase into mid-90s.

## 2016-05-25 NOTE — H&P (Signed)
History and Physical  Patient Name: Nicholas Caldwell     GDJ:[id number]    DOB: 1946-04-04    DOA: 05/25/2016 PCP: [facility] Dr. Marjo Bicker  Patient coming from: Home  Chief Complaint: Cough and dyspnea  HPI: Nicholas Caldwell is a 70 y.o. male with a past medical history significant for NICM and chronic systolic CHF EF 62-22%, COPD not on home O2, Aflutter on Xarelto and +/- amiodarone, NIDDM and chronic pain on methadone who presents with acute on chronic dyspnea.  She describes 3 weeks to a month of dizziness and progressive shortness of breath. Over the last several days he's had increase in cough, sputum that is thick and yellow, and worsening dyspnea to the point that today, his breathing was still difficult despite home bronchodilators, and he felt weak and Dropping things, so he came to the ER. He also endorses some worsening orthopnea and leg swelling.  ED course: -Afebrile, heart rate and blood pressure normal -Respirations appeared to vary (Cheyne stokes pattern), and O2 saturation on my exam were in mid-90s without supplemental O2 but dropped to low-80s when he had hypopneic periods until he breathed deeply -Na 138, K 3.9, HCO3 33, Cr 0.7 (baseline), WBC 9.4 K, Hgb 12.3, troponin negative, BNP 774 pg per mL. -ABG showed pH 7.36, PCO2 60, PO2 55 -Chest x-ray showed bilateral congestion, consistent with mild edema -ECG showed rate controlled atrial flutter -He was given ceftriaxone and doxycycline, prednisone, and bronchodilators for COPD flare and TRH were called for evaluation -He felt improvement with bronchodilators       ROS: Pt complains of cough, change in sputum character, leg swelling, mild intermittent brief left-sided pleuritic pain, orthopnea, shortness of breath, dizziness, global weakness.  Pt denies any fever, chills, hemoptysis, confusion, syncope.    All other systems negative except as just noted or noted in the history of present illness.    Past Medical  History:  Diagnosis Date  . Atrial flutter ([place])    a. recurrent AFlutter with RVR;  b. Amiodarone Rx started 4/16  . CAD (coronary artery disease)    a. LHC 1/16:  mLAD diffuse disease, pLCx mild disease, dLCx with disease but too small for PCI, RCA ok, EF 25-30%  . Chronic systolic CHF (congestive heart failure) ([place])   . COPD (chronic obstructive pulmonary disease) ([place])   . Diabetes mellitus without complication ([place])   . Hypercholesteremia   . Hypertension   . NICM (nonischemic cardiomyopathy) ([place])    a. Echo 4/16:  EF 25-30%, diffuse HK, mild MR, severe LAE  . Persistent atrial fibrillation ([place])   . Prolonged Q-T interval on ECG   . Tobacco abuse     Past Surgical History:  Procedure Laterality Date  . LEFT AND RIGHT HEART CATHETERIZATION WITH CORONARY ANGIOGRAM N/A 11/06/2014   Procedure: LEFT AND RIGHT HEART CATHETERIZATION WITH CORONARY ANGIOGRAM;  Surgeon: Jettie Booze, MD;  Location: [facility] CATH LAB;  Service: Cardiovascular;  Laterality: N/A;    Social History: Patient lives with his wife.  He is retired from working in Teacher, adult education.  The patient walks unassisted.  He is a former smoker, quit recently.    No Known Allergies  Family history: family history includes Heart disease in his father and mother; Heart failure in his mother; Hypertension in his mother and sister.  Prior to Admission medications   Medication Sig Start Date End Date Taking? Authorizing Provider  albuterol (PROVENTIL HFA;VENTOLIN HFA) 108 (90 BASE) MCG/ACT inhaler Inhale 2 puffs  into the lungs every 6 (six) hours as needed for wheezing or shortness of breath. 11/07/14  Yes Kinnie Feil, MD  carvedilol (COREG) 25 MG tablet Take 1.5 tablets (37.5 mg total) by mouth 2 (two) times daily with a meal. 01/21/16  Yes Scott T Kathlen Mody, PA-C  diazepam (VALIUM) 10 MG tablet Take 10 mg by mouth at bedtime.   Yes Historical Provider, MD  diclofenac sodium (VOLTAREN) 1 % GEL Apply 2 g topically 4 (four) times  daily. 02/23/15  Yes Luan Moore, MD  digoxin (LANOXIN) 0.125 MG tablet Take 1 tablet (0.125 mg total) by mouth daily. 09/12/15  Yes Scott T Kathlen Mody, PA-C  furosemide (LASIX) 40 MG tablet Take 80 mg by mouth 2 (two) times daily.   Yes Historical Provider, MD  gabapentin (NEURONTIN) 600 MG tablet Take 600 mg by mouth 2 (two) times daily.    Yes Historical Provider, MD  glipiZIDE (GLUCOTROL) 10 MG tablet Take 20 mg by mouth 2 (two) times daily before a meal.   Yes Historical Provider, MD  hydrALAZINE (APRESOLINE) 25 MG tablet Take 1 tablet (25 mg total) by mouth 3 (three) times daily. 09/12/15  Yes Scott Joylene Draft, PA-C  isosorbide mononitrate (IMDUR) 30 MG 24 hr tablet Take 1 tablet (30 mg total) by mouth daily. 10/09/15  Yes Jettie Booze, MD  metFORMIN (GLUCOPHAGE) 1000 MG tablet Take 1,000 mg by mouth 2 (two) times daily with a meal.   Yes Historical Provider, MD  methadone (DOLOPHINE) 10 MG tablet Take 10 mg by mouth at bedtime.    Yes Historical Provider, MD  metolazone (ZAROXOLYN) 2.5 MG tablet Take 2.5 mg by mouth daily.   Yes Historical Provider, MD  potassium chloride SA (K-DUR,KLOR-CON) 20 MEQ tablet Take 2 tablets (40 mEq total) by mouth daily. 11/07/14  Yes Kinnie Feil, MD  rivaroxaban (XARELTO) 20 MG TABS tablet Take 1 tablet (20 mg total) by mouth daily with supper. 02/23/15  Yes Luan Moore, MD  rosuvastatin (CRESTOR) 20 MG tablet Take 20 mg by mouth daily.   Yes Historical Provider, MD  sacubitril-valsartan (ENTRESTO) 49-51 MG Take 1 tablet by mouth 2 (two) times daily. 09/12/15  Yes Scott Joylene Draft, PA-C  spironolactone (ALDACTONE) 25 MG tablet Take 1 tablet (25 mg total) by mouth daily. 08/02/15  Yes Liliane Shi, PA-C  Vitamin D, Ergocalciferol, (DRISDOL) 50000 UNITS CAPS capsule Take 50,000 Units by mouth every 7 (seven) days. Pt takes on Sunday.   Yes Historical Provider, MD  amiodarone (PACERONE) 200 MG tablet Take 1 tablet (200 mg total) by mouth 2 (two) times daily. Patient  not taking: Reported on 05/25/2016 01/21/16   Liliane Shi, PA-C       Physical Exam: BP 160/95 (BP Location: Left Arm)   Pulse 88   Temp 98.4 F (36.9 C) (Oral)   Resp 17   Wt 117.9 kg (260 lb)   SpO2 97%   BMI 35.26 kg/m  General appearance: Well-developed, obese adult male, alert and in no acute distress.   Eyes: Anicteric, conjunctiva pink, lids and lashes normal.     ENT: No nasal deformity, discharge in nares, or epistaxis.  OP moist without lesions.  Mild posterior purulent nasal drainage. Lymph: No cervical, supraclavicular lymphadenopathy. Skin: Warm and dry.  No suspicious rashes or lesions. Cardiac: RRR, nl S1-S2, gallop present.  Capillary refill is brisk.  JVP not visible.  2+ LE edema to knees.  Radial  pulses 2+ and symmetric, DP pulses not  appreciated but feet warm Respiratory: Normal respiratory rate and rhythm.  Some bilateral rhonchi, no significant wheezes or rales GI: Abdomen soft without rigidity.  No TTP. No ascites, distension, hepatosplenomegaly.   MSK: No deformities or effusions.  No clubbing/cyanosis. Neuro: Pupils are 3 mm and reactive to 2 mm.  Extraocular movements are intact, without nystagmus.  Cranial nerve 5 is within normal limits.  Cranial nerve 7 is symmetrical.  Cranial nerve 8 is within normal limits.  Cranial nerves 9 and 10 reveal equal palate elevation.  Cranial nerve 11 reveals sternocleidomastoid strong.  Cranial nerve 12 is midline. Motor strength testing is 5/5 in the upper and lower extremities bilaterally with normal motor, tone and bulk.  Sensory examination is intact to light touch and position.  The patient is oriented to time, place and person.  Speech is fluent, but he exhibits marked psychomotor slowing (his wife actually states though that this is his normal).  Naming is grossly intact.  Recall, recent and remote, as well as general fund of knowledge seem within normal limits.  Attention span and concentration are within normal limits.    Psych: Affect blunted, psychomotor slowing, wife states this is normal.  Judgment and insight appear normal.       Labs on Admission:  I have personally reviewed following labs and imaging studies: CBC:  Recent Labs Lab 05/25/16 1842  WBC 9.4  HGB 12.3*  HCT 39.3  MCV 85.1  PLT 960*   Basic Metabolic Panel:  Recent Labs Lab 05/25/16 1842  NA 138  K 3.9  CL 99*  CO2 33*  GLUCOSE 192*  BUN 14  CREATININE 0.71  CALCIUM 8.6*   GFR: Estimated Creatinine Clearance: 117.1 mL/min (by C-G formula based on SCr of 0.8 mg/dL).  BNP (last 3 results)  Recent Labs  08/02/15 1714  PROBNP 363.0*        Radiological Exams on Admission: Personally reviewed: Dg Chest 2 View  Result Date: 05/25/2016 CLINICAL DATA:  Productive cough for 2 weeks. EXAM: CHEST  2 VIEW COMPARISON:  PA and lateral chest 02/23/2015 and 10/31/2014. FINDINGS: There is cardiomegaly and vascular congestion. No consolidative process, pneumothorax or effusion is identified. No focal bony abnormality. IMPRESSION: Cardiomegaly and pulmonary vascular congestion. Electronically Signed   By: Inge Rise M.D.   On: 05/25/2016 18:44   EKG: Independently reviewed. Rate 89, QTc uninterpretable given Aflutter.      Assessment/Plan 1. COPD exacerbation with hypoxic and hypercarbic respiratory failure, probably sleep disordered breathing:  The patient's pH and HCO3 suggest that his pCO2 is chronically 60 or so.    His hypoxia on the other hand may be related to acute on chronic bronchitis (he takes only albuterol PRN at home and used to smoke), but it really appears on exam to drop rhythmically with hypopneas, which I have to assume are from his CHF and perhaps improper methadone use (neuro exam is non-focal).   -Prednisone 40 mg for 5 days -Albuterol scheduled and PRN -Continuous pulse ox given desaturations -Supplemental O2 -Doxycycline 100 mg BID for 7 days for COPD with purulent sputum    2. HTN:    -Continue carvedilol, hydralazine, Imdur, spironolactone, and Entresto  3. Acute on chronic systolic CHF:  Last EF 45% in 2015. -Furosemide IV twice daily -Strict I/Os, dailg weights, daily electroyltes whil in hospital -Continue carvedilol, digoxin, hydralazine, Imdur, statin, Entresto, spironolactone  4. Atrial fibrillation:  CHADS2Vasc 4.  On Xarelto.  Rate controlled.  Previously on amiodarone (per last  Cards note) patient states he no longer takes. -Continue carvedilol and digoxin -Continue Xarelto  5. NIDDM with neuropathy:  The patient states that he takes methadone "as needed", sometimes 2-3 times per day. I was emphatic that methadone is not a when necessary medication, that he should discuss this with his prescriber Dr. Marjo Bicker at the [facility]. -Hold glipizide and metformin while in hospital -Sliding scale corrections while in the hospital -Continue gabapentin -Hold methadone -Oxycodone when necessary instead  6. History of prolonged QTc:  -Probably avoid methadone. -Avoid azithromycin      DVT prophylaxis: Xarelto  Code Status: FULL  Family Communication: Wife at bedside.  An opportunity for questions was given and all questions were answered.  Disposition Plan: Anticipate observation ovneright, diuretics tomorrow, continue steroids, bronchodilators and nebs, with likely discharge tomorrwo with PCP follow up. Consults called: None Admission status: OBS, tele At the point of initial evaluation, it is my clinical opinion that admission for OBSERVATION is reasonable and necessary because the patient's presenting complaints in the context of their chronic conditions represent sufficient risk of deterioration or significant morbidity to constitute reasonable grounds for close observation in the hospital setting, but that the patient may be medically stable for discharge from the hospital within 24 to 48 hours.    Medical decision making: Patient seen at 10:00 PM on  05/25/2016.  The patient was discussed with Dr. Regenia Skeeter. What exists of the patient's chart was reviewed in depth.  Clinical condition: stable.        Edwin Dada [facility] Pager [phone]

## 2016-05-26 DIAGNOSIS — J441 Chronic obstructive pulmonary disease with (acute) exacerbation: Secondary | ICD-10-CM | POA: Diagnosis not present

## 2016-05-26 DIAGNOSIS — R0602 Shortness of breath: Secondary | ICD-10-CM | POA: Diagnosis not present

## 2016-05-26 LAB — GLUCOSE, CAPILLARY
GLUCOSE-CAPILLARY: 234 mg/dL — AB (ref 65–99)
GLUCOSE-CAPILLARY: 221 mg/dL — AB (ref 65–99)

## 2016-05-26 LAB — BASIC METABOLIC PANEL
ANION GAP: 7 (ref 5–15)
BUN: 15 mg/dL (ref 6–20)
CHLORIDE: 99 mmol/L — AB (ref 101–111)
CO2: 33 mmol/L — ABNORMAL HIGH (ref 22–32)
CREATININE: 0.7 mg/dL (ref 0.61–1.24)
Calcium: 8.7 mg/dL — ABNORMAL LOW (ref 8.9–10.3)
GFR calc non Af Amer: >60 mL/min (ref >60)
Glucose, Bld: 239 mg/dL — ABNORMAL HIGH (ref 65–99)
POTASSIUM: 4.4 mmol/L (ref 3.5–5.1)
SODIUM: 139 mmol/L (ref 135–145)

## 2016-05-26 LAB — CBC
HCT: 39.6 % (ref 39.0–52.0)
Hemoglobin: 12.4 g/dL — ABNORMAL LOW (ref 13.0–17.0)
MCH: 26.6 pg (ref 26.0–34.0)
MCHC: 31.3 g/dL (ref 30.0–36.0)
MCV: 85 fL (ref 78.0–100.0)
PLATELETS: 431 10*3/uL — AB (ref 150–400)
RBC: 4.66 MIL/uL (ref 4.22–5.81)
RDW: 15.7 % — ABNORMAL HIGH (ref 11.5–15.5)
WBC: 10.8 10*3/uL — AB (ref 4.0–10.5)

## 2016-05-26 LAB — BLOOD GAS, ARTERIAL
ACID-BASE EXCESS: 7.4 mmol/L — AB (ref 0.0–2.0)
BICARBONATE: 33.9 meq/L — AB (ref 20.0–24.0)
Drawn by: 441381
FIO2: 0.21
O2 SAT: 85.1 %
PO2 ART: 55.6 mmHg — AB (ref 80.0–100.0)
Patient temperature: 100.3
TCO2: 30.6 mmol/L (ref 0–100)
pCO2 arterial: 61.3 mmHg (ref 35.0–45.0)
pH, Arterial: 7.367 (ref 7.350–7.450)

## 2016-05-26 LAB — DIGOXIN LEVEL: Digoxin Level: <0.2 ng/mL — ABNORMAL LOW (ref 0.8–2.0)

## 2016-05-26 MED ORDER — DOXYCYCLINE HYCLATE 100 MG PO TABS: 100.0000 mg | ORAL_TABLET | Freq: Two times a day (BID) | ORAL | 0 refills | Status: DC

## 2016-05-26 MED ORDER — INSULIN DETEMIR 100 UNIT/ML ~~LOC~~ SOLN
44.0000 [IU] | Freq: Every day | SUBCUTANEOUS | Status: DC
  Administered 2016-05-26: 44 [IU] via SUBCUTANEOUS
  Filled 2016-05-26 (×2): qty 0.44

## 2016-05-26 MED ORDER — IPRATROPIUM-ALBUTEROL 0.5-2.5 (3) MG/3ML IN SOLN
3.0000 mL | Freq: Three times a day (TID) | RESPIRATORY_TRACT | Status: DC
  Administered 2016-05-26: 3 mL via RESPIRATORY_TRACT
  Filled 2016-05-26: qty 3

## 2016-05-26 MED ORDER — PREDNISONE 20 MG PO TABS: 40.0000 mg | ORAL_TABLET | Freq: Every day | ORAL | 0 refills | Status: AC

## 2016-05-26 NOTE — Progress Notes (Signed)
Inpatient Diabetes Program Recommendations  AACE/ADA: New Consensus Statement on Inpatient Glycemic Control (2015)  Target Ranges:  Prepandial:   less than 140 mg/dL      Peak postprandial:   less than 180 mg/dL (1-2 hours)      Critically ill patients:  140 - 180 mg/dL   Lab Results  Component Value Date   GLUCAP 234 (H) 05/26/2016   HGBA1C 9.8 (H) 02/19/2015    Review of Glycemic Control  Diabetes history: DM2 Outpatient Diabetes medications: Levemir 44 units QHS, glipizide 20 mg bid, metformin 1000 mg bid,  Current orders for Inpatient glycemic control: Levemir 44 units QHS, Novolog moderate tidwc, Prednisone 40 mg QD  Inpatient Diabetes Program Recommendations:    Check HgbA1C to assess glycemic control prior to hospitalization. Last one - 9.8% on 02/19/2015 Increase Levemir to 46 units QHS Increase Novolog to resistant tidwc and hs.  Will follow. Thank you. Lorenda Peck, RD, LDN, CDE Inpatient Diabetes Coordinator [phone]

## 2016-05-26 NOTE — Progress Notes (Addendum)
It became clear while educating pt on CHF/COPD that pt and wife  have  a knowledge deficit  of his heart medical history, and medications.   They were completely un- aware of this history of Afib/flutter, irregular heart rythym,  and history.   Significant time spent discussing CHF, Arrythmias, COPD, medications, when to contact MD etc and written materials given to support education provided.  Much reinforcement still  needed.   Not very receptive of education.  Noncompliance with ordered medications at home noted.  Pt states "he has NEVER taken Xarleto at home b/c he didn't like the side effects he heard about on the commercials"  And "he believes that it can cause blood clots".    Pt also mentioned other meds he has been noncompliant with but attempts to clarify were met with changing answers.

## 2016-05-26 NOTE — Progress Notes (Signed)
SATURATION QUALIFICATIONS: (This note is used to comply with regulatory documentation for home oxygen)  Patient Saturations on Room Air at Rest = 96-97%  Patient Saturations on Room Air while Ambulating = 88%  Patient Saturations on 2 Liters of oxygen while Ambulating = 97-99%

## 2016-05-26 NOTE — Progress Notes (Signed)
Patient and family given discharge, follow up, and medication instructions, verbalized understanding, IV and telemetry removed, family to transport home

## 2016-05-26 NOTE — Discharge Summary (Signed)
Physician Discharge Summary  Nicholas Caldwell DSK:[id number] DOB: 1946-04-11 DOA: 05/25/2016  PCP: [place] date: 05/25/2016 Discharge date: 05/26/2016  Time spent:> 35 minutes  Recommendations for Outpatient Follow-up:  1. Monitor and decide whether or not patient will require higher doses of prednisone or lasix   Discharge Diagnoses:  Principal Problem:   COPD exacerbation ([place]) Active Problems:   Essential hypertension   Type 2 diabetes mellitus with diabetic neuropathy, without long-term current use of insulin (HCC)   Acute on chronic systolic CHF (congestive heart failure) (HCC)   Acute respiratory failure with hypoxia and hypercarbia (HCC)   Persistent atrial fibrillation [facility])   Discharge Condition: stable  Diet recommendation: heart healthy  Filed Weights   05/25/16 1805 05/25/16 2356  Weight: 117.9 kg (260 lb) 126.2 kg (278 lb 4.8 oz)    History of present illness:   70 y.o. male with a past medical history significant for NICM and chronic systolic CHF EF 62-03%, COPD not on home O2, Aflutter on Xarelto and +/- amiodarone, NIDDM and chronic pain on methadone who presents with acute on chronic dyspnea.  Hospital Course:  COPD exacerbation with hypoxic and hypercarbic respiratory failure, probably sleep disordered breathing:  The patient's pH and HCO3 suggest that his pCO2 is chronically 60 or so.    His hypoxia on the other hand may be related to acute on chronic bronchitis (he takes only albuterol PRN at home and used to smoke), but it really appears on exam to drop rhythmically with hypopneas, which I have to assume are from his CHF and perhaps improper methadone use (neuro exam is non-focal).   -Prednisone 40 mg for 5 days -Albuterol scheduled and PRN -Doxycycline 100 mg BID for 7 days for COPD    For other known medical condition will continue home medication regimen.   Procedures:  None  Consultations:  None  Discharge Exam: Vitals:    05/25/16 2356 05/26/16 0832  BP: (!) 152/88 (!) 147/96  Pulse: 94 (!) 103  Resp: 18   Temp: 98.9 F (37.2 C)     General: Pt in nad, alert and awake Cardiovascular: rrr, no rubs Respiratory: no increase wob, wheezes with expiration mild, no rhales  Discharge Instructions   Discharge Instructions    Diet - low sodium heart healthy    Complete by:  As directed   Discharge instructions    Complete by:  As directed   Please be sure to follow up with your primary care physician in 1-2 weeks or sooner should any new concerns arise.   Increase activity slowly    Complete by:  As directed     Current Discharge Medication List    START taking these medications   Details  doxycycline (VIBRA-TABS) 100 MG tablet Take 1 tablet (100 mg total) by mouth every 12 (twelve) hours. Qty: 12 tablet, Refills: 0    predniSONE (DELTASONE) 20 MG tablet Take 2 tablets (40 mg total) by mouth daily with breakfast. Qty: 8 tablet, Refills: 0      CONTINUE these medications which have NOT CHANGED   Details  albuterol (PROVENTIL HFA;VENTOLIN HFA) 108 (90 BASE) MCG/ACT inhaler Inhale 2 puffs into the lungs every 6 (six) hours as needed for wheezing or shortness of breath. Qty: 1 Inhaler, Refills: 2    carvedilol (COREG) 25 MG tablet Take 1.5 tablets (37.5 mg total) by mouth 2 (two) times daily with a meal. Qty: 90 tablet, Refills: 11   Associated Diagnoses: Nonischemic cardiomyopathy ([place])  diazepam (VALIUM) 10 MG tablet Take 10 mg by mouth at bedtime.    diclofenac sodium (VOLTAREN) 1 % GEL Apply 2 g topically 4 (four) times daily. Qty: 100 g, Refills: 0    digoxin (LANOXIN) 0.125 MG tablet Take 1 tablet (0.125 mg total) by mouth daily. Qty: 30 tablet, Refills: 11   Associated Diagnoses: Atrial fibrillation and flutter (HCC)    furosemide (LASIX) 40 MG tablet Take 80 mg by mouth 2 (two) times daily.    gabapentin (NEURONTIN) 600 MG tablet Take 600 mg by mouth 2 (two) times daily.  Refills:  6    glipiZIDE (GLUCOTROL) 10 MG tablet Take 20 mg by mouth 2 (two) times daily before a meal.    hydrALAZINE (APRESOLINE) 25 MG tablet Take 1 tablet (25 mg total) by mouth 3 (three) times daily. Qty: 90 tablet, Refills: 11   Associated Diagnoses: Chronic systolic heart failure (HCC)    insulin detemir (LEVEMIR) 100 UNIT/ML injection Inject 44 Units into the skin at bedtime.    isosorbide mononitrate (IMDUR) 30 MG 24 hr tablet Take 1 tablet (30 mg total) by mouth daily. Qty: 30 tablet, Refills: 3    metFORMIN (GLUCOPHAGE) 1000 MG tablet Take 1,000 mg by mouth 2 (two) times daily with a meal.    methadone (DOLOPHINE) 10 MG tablet Take 10 mg by mouth at bedtime.  Refills: 0    metolazone (ZAROXOLYN) 2.5 MG tablet Take 2.5 mg by mouth daily.    potassium chloride SA (K-DUR,KLOR-CON) 20 MEQ tablet Take 2 tablets (40 mEq total) by mouth daily. Qty: 30 tablet, Refills: 0    rivaroxaban (XARELTO) 20 MG TABS tablet Take 1 tablet (20 mg total) by mouth daily with supper. Qty: 30 tablet, Refills: 0    rosuvastatin (CRESTOR) 20 MG tablet Take 20 mg by mouth daily.    sacubitril-valsartan (ENTRESTO) 49-51 MG Take 1 tablet by mouth 2 (two) times daily. Qty: 60 tablet, Refills: 11   Associated Diagnoses: Chronic systolic heart failure (HCC)    spironolactone (ALDACTONE) 25 MG tablet Take 1 tablet (25 mg total) by mouth daily. Qty: 30 tablet, Refills: 11   Associated Diagnoses: Chronic systolic heart failure (HCC); SOB (shortness of breath)    Vitamin D, Ergocalciferol, (DRISDOL) 50000 UNITS CAPS capsule Take 50,000 Units by mouth every 7 (seven) days. Pt takes on Sunday.    amiodarone (PACERONE) 200 MG tablet Take 1 tablet (200 mg total) by mouth 2 (two) times daily. Qty: 60 tablet, Refills: 11   Associated Diagnoses: Paroxysmal atrial fibrillation (HCC)       No Known Allergies    The results of significant diagnostics from this hospitalization (including imaging, microbiology,  ancillary and laboratory) are listed below for reference.    Significant Diagnostic Studies: Dg Chest 2 View  Result Date: 05/25/2016 CLINICAL DATA:  Productive cough for 2 weeks. EXAM: CHEST  2 VIEW COMPARISON:  PA and lateral chest 02/23/2015 and 10/31/2014. FINDINGS: There is cardiomegaly and vascular congestion. No consolidative process, pneumothorax or effusion is identified. No focal bony abnormality. IMPRESSION: Cardiomegaly and pulmonary vascular congestion. Electronically Signed   By: Inge Rise M.D.   On: 05/25/2016 18:44   Microbiology: No results found for this or any previous visit (from the past 240 hour(s)).   Labs: Basic Metabolic Panel:  Recent Labs Lab 05/25/16 1842 05/26/16 0432  NA 138 139  K 3.9 4.4  CL 99* 99*  CO2 33* 33*  GLUCOSE 192* 239*  BUN 14 15  CREATININE  0.71 0.70  CALCIUM 8.6* 8.7*   Liver Function Tests: No results for input(s): AST, ALT, ALKPHOS, BILITOT, PROT, ALBUMIN in the last 168 hours. No results for input(s): LIPASE, AMYLASE in the last 168 hours. No results for input(s): AMMONIA in the last 168 hours. CBC:  Recent Labs Lab 05/25/16 1842 05/26/16 0432  WBC 9.4 10.8*  HGB 12.3* 12.4*  HCT 39.3 39.6  MCV 85.1 85.0  PLT 418* 431*   Cardiac Enzymes: No results for input(s): CKTOTAL, CKMB, CKMBINDEX, TROPONINI in the last 168 hours. BNP: BNP (last 3 results)  Recent Labs  03/24/16 1656 05/25/16 1904  BNP 191.4* 774.6*    ProBNP (last 3 results)  Recent Labs  08/02/15 1714  PROBNP 363.0*    CBG:  Recent Labs Lab 05/25/16 2350 05/26/16 0735 05/26/16 1154  GLUCAP 266* 234* 221*    Signed:  Velvet Bathe MD.  [facility] 05/26/2016, 1:05 PM

## 2016-05-30 LAB — CULTURE, BLOOD (ROUTINE X 2)
Culture: NO GROWTH
Culture: NO GROWTH

## 2016-06-11 DIAGNOSIS — Z79899 Other long term (current) drug therapy: Secondary | ICD-10-CM | POA: Diagnosis not present

## 2016-06-11 DIAGNOSIS — Z79891 Long term (current) use of opiate analgesic: Secondary | ICD-10-CM | POA: Diagnosis not present

## 2016-06-11 DIAGNOSIS — M533 Sacrococcygeal disorders, not elsewhere classified: Secondary | ICD-10-CM | POA: Diagnosis not present

## 2016-06-11 DIAGNOSIS — G894 Chronic pain syndrome: Secondary | ICD-10-CM | POA: Diagnosis not present

## 2016-06-29 ENCOUNTER — Emergency Department (HOSPITAL_COMMUNITY): Payer: Medicare Other

## 2016-06-29 ENCOUNTER — Encounter (HOSPITAL_COMMUNITY): Payer: Self-pay | Admitting: *Deleted

## 2016-06-29 ENCOUNTER — Inpatient Hospital Stay (HOSPITAL_COMMUNITY)
Admission: EM | Admit: 2016-06-29 | Discharge: 2016-07-04 | DRG: 189 | Disposition: A | Discharge status: Home Health | Payer: Medicare Other | Attending: Internal Medicine | Admitting: Internal Medicine

## 2016-06-29 DIAGNOSIS — I110 Hypertensive heart disease with heart failure: Secondary | ICD-10-CM | POA: Diagnosis present

## 2016-06-29 DIAGNOSIS — Z87891 Personal history of nicotine dependence: Secondary | ICD-10-CM

## 2016-06-29 DIAGNOSIS — I481 Persistent atrial fibrillation: Secondary | ICD-10-CM | POA: Diagnosis present

## 2016-06-29 DIAGNOSIS — R748 Abnormal levels of other serum enzymes: Secondary | ICD-10-CM | POA: Diagnosis present

## 2016-06-29 DIAGNOSIS — R062 Wheezing: Secondary | ICD-10-CM | POA: Diagnosis not present

## 2016-06-29 DIAGNOSIS — Z7901 Long term (current) use of anticoagulants: Secondary | ICD-10-CM | POA: Diagnosis not present

## 2016-06-29 DIAGNOSIS — J441 Chronic obstructive pulmonary disease with (acute) exacerbation: Secondary | ICD-10-CM | POA: Diagnosis present

## 2016-06-29 DIAGNOSIS — I10 Essential (primary) hypertension: Secondary | ICD-10-CM | POA: Diagnosis present

## 2016-06-29 DIAGNOSIS — I5023 Acute on chronic systolic (congestive) heart failure: Secondary | ICD-10-CM | POA: Diagnosis present

## 2016-06-29 DIAGNOSIS — R7989 Other specified abnormal findings of blood chemistry: Secondary | ICD-10-CM | POA: Diagnosis present

## 2016-06-29 DIAGNOSIS — J9601 Acute respiratory failure with hypoxia: Secondary | ICD-10-CM | POA: Diagnosis present

## 2016-06-29 DIAGNOSIS — D649 Anemia, unspecified: Secondary | ICD-10-CM | POA: Diagnosis present

## 2016-06-29 DIAGNOSIS — G8929 Other chronic pain: Secondary | ICD-10-CM | POA: Diagnosis present

## 2016-06-29 DIAGNOSIS — E1165 Type 2 diabetes mellitus with hyperglycemia: Secondary | ICD-10-CM | POA: Diagnosis present

## 2016-06-29 DIAGNOSIS — Z8249 Family history of ischemic heart disease and other diseases of the circulatory system: Secondary | ICD-10-CM

## 2016-06-29 DIAGNOSIS — I429 Cardiomyopathy, unspecified: Secondary | ICD-10-CM | POA: Diagnosis not present

## 2016-06-29 DIAGNOSIS — I4581 Long QT syndrome: Secondary | ICD-10-CM | POA: Diagnosis present

## 2016-06-29 DIAGNOSIS — I272 Other secondary pulmonary hypertension: Secondary | ICD-10-CM | POA: Diagnosis present

## 2016-06-29 DIAGNOSIS — J9621 Acute and chronic respiratory failure with hypoxia: Secondary | ICD-10-CM | POA: Diagnosis not present

## 2016-06-29 DIAGNOSIS — E785 Hyperlipidemia, unspecified: Secondary | ICD-10-CM | POA: Diagnosis present

## 2016-06-29 DIAGNOSIS — I4892 Unspecified atrial flutter: Secondary | ICD-10-CM | POA: Diagnosis not present

## 2016-06-29 DIAGNOSIS — R0689 Other abnormalities of breathing: Secondary | ICD-10-CM

## 2016-06-29 DIAGNOSIS — IMO0002 Reserved for concepts with insufficient information to code with codable children: Secondary | ICD-10-CM | POA: Diagnosis present

## 2016-06-29 DIAGNOSIS — E1140 Type 2 diabetes mellitus with diabetic neuropathy, unspecified: Secondary | ICD-10-CM

## 2016-06-29 DIAGNOSIS — J9602 Acute respiratory failure with hypercapnia: Secondary | ICD-10-CM | POA: Diagnosis not present

## 2016-06-29 DIAGNOSIS — J9622 Acute and chronic respiratory failure with hypercapnia: Secondary | ICD-10-CM | POA: Diagnosis not present

## 2016-06-29 DIAGNOSIS — Z79899 Other long term (current) drug therapy: Secondary | ICD-10-CM

## 2016-06-29 DIAGNOSIS — Z6834 Body mass index (BMI) 34.0-34.9, adult: Secondary | ICD-10-CM

## 2016-06-29 DIAGNOSIS — G9340 Encephalopathy, unspecified: Secondary | ICD-10-CM | POA: Diagnosis present

## 2016-06-29 DIAGNOSIS — I2510 Atherosclerotic heart disease of native coronary artery without angina pectoris: Secondary | ICD-10-CM | POA: Diagnosis present

## 2016-06-29 DIAGNOSIS — E1142 Type 2 diabetes mellitus with diabetic polyneuropathy: Secondary | ICD-10-CM | POA: Diagnosis not present

## 2016-06-29 DIAGNOSIS — E0842 Diabetes mellitus due to underlying condition with diabetic polyneuropathy: Secondary | ICD-10-CM | POA: Diagnosis present

## 2016-06-29 DIAGNOSIS — E7800 Pure hypercholesterolemia, unspecified: Secondary | ICD-10-CM | POA: Diagnosis present

## 2016-06-29 DIAGNOSIS — R0600 Dyspnea, unspecified: Secondary | ICD-10-CM

## 2016-06-29 DIAGNOSIS — F17210 Nicotine dependence, cigarettes, uncomplicated: Secondary | ICD-10-CM | POA: Diagnosis present

## 2016-06-29 DIAGNOSIS — Z9119 Patient's noncompliance with other medical treatment and regimen: Secondary | ICD-10-CM

## 2016-06-29 DIAGNOSIS — R0602 Shortness of breath: Secondary | ICD-10-CM | POA: Diagnosis not present

## 2016-06-29 DIAGNOSIS — E669 Obesity, unspecified: Secondary | ICD-10-CM | POA: Diagnosis present

## 2016-06-29 DIAGNOSIS — Z794 Long term (current) use of insulin: Secondary | ICD-10-CM

## 2016-06-29 DIAGNOSIS — Z79891 Long term (current) use of opiate analgesic: Secondary | ICD-10-CM

## 2016-06-29 DIAGNOSIS — R778 Other specified abnormalities of plasma proteins: Secondary | ICD-10-CM | POA: Diagnosis present

## 2016-06-29 DIAGNOSIS — E118 Type 2 diabetes mellitus with unspecified complications: Secondary | ICD-10-CM

## 2016-06-29 DIAGNOSIS — I480 Paroxysmal atrial fibrillation: Secondary | ICD-10-CM | POA: Diagnosis present

## 2016-06-29 DIAGNOSIS — I509 Heart failure, unspecified: Secondary | ICD-10-CM | POA: Diagnosis not present

## 2016-06-29 LAB — I-STAT ARTERIAL BLOOD GAS, ED
Acid-Base Excess: 3 mmol/L — ABNORMAL HIGH (ref 0.0–2.0)
Bicarbonate: 33.1 mEq/L — ABNORMAL HIGH (ref 20.0–24.0)
O2 SAT: 76 %
PCO2 ART: 75.4 mmHg — AB (ref 35.0–45.0)
PH ART: 7.251 — AB (ref 7.350–7.450)
Patient temperature: 98.6
TCO2: 35 mmol/L (ref 0–100)
pO2, Arterial: 49 mmHg — ABNORMAL LOW (ref 80.0–100.0)

## 2016-06-29 LAB — COMPREHENSIVE METABOLIC PANEL
ALK PHOS: 107 U/L (ref 38–126)
ALT: 23 U/L (ref 17–63)
ANION GAP: 7 (ref 5–15)
AST: 22 U/L (ref 15–41)
Albumin: 3 g/dL — ABNORMAL LOW (ref 3.5–5.0)
BUN: 9 mg/dL (ref 6–20)
CALCIUM: 8 mg/dL — AB (ref 8.9–10.3)
CO2: 27 mmol/L (ref 22–32)
Chloride: 103 mmol/L (ref 101–111)
Creatinine, Ser: 0.69 mg/dL (ref 0.61–1.24)
GFR calc non Af Amer: >60 mL/min (ref >60)
Glucose, Bld: 139 mg/dL — ABNORMAL HIGH (ref 65–99)
POTASSIUM: 3.8 mmol/L (ref 3.5–5.1)
SODIUM: 137 mmol/L (ref 135–145)
TOTAL PROTEIN: 6.6 g/dL (ref 6.5–8.1)
Total Bilirubin: 0.6 mg/dL (ref 0.3–1.2)

## 2016-06-29 LAB — CBC WITH DIFFERENTIAL/PLATELET
BASOS PCT: 0 %
Basophils Absolute: 0 10*3/uL (ref 0.0–0.1)
EOS ABS: 0 10*3/uL (ref 0.0–0.7)
EOS PCT: 0 %
HCT: 39.5 % (ref 39.0–52.0)
HEMOGLOBIN: 11.9 g/dL — AB (ref 13.0–17.0)
LYMPHS PCT: 14 %
Lymphs Abs: 1.1 10*3/uL (ref 0.7–4.0)
MCH: 26.4 pg (ref 26.0–34.0)
MCHC: 30.1 g/dL (ref 30.0–36.0)
MCV: 87.6 fL (ref 78.0–100.0)
MONO ABS: 0.5 10*3/uL (ref 0.1–1.0)
MONOS PCT: 6 %
NEUTROS ABS: 6.2 10*3/uL (ref 1.7–7.7)
NEUTROS PCT: 80 %
PLATELETS: 341 10*3/uL (ref 150–400)
RBC: 4.51 MIL/uL (ref 4.22–5.81)
RDW: 16.9 % — AB (ref 11.5–15.5)
WBC: 7.7 10*3/uL (ref 4.0–10.5)

## 2016-06-29 LAB — BRAIN NATRIURETIC PEPTIDE: B Natriuretic Peptide: 660.8 pg/mL — ABNORMAL HIGH (ref 0.0–100.0)

## 2016-06-29 LAB — TROPONIN I: Troponin I: 0.03 ng/mL (ref <0.03)

## 2016-06-29 MED ORDER — METHYLPREDNISOLONE SODIUM SUCC 125 MG IJ SOLR
125.0000 mg | Freq: Once | INTRAMUSCULAR | Status: AC
  Administered 2016-06-29: 125 mg via INTRAVENOUS
  Filled 2016-06-29: qty 2

## 2016-06-29 MED ORDER — SODIUM CHLORIDE 0.9 % IV SOLN
2000.0000 mg | Freq: Once | INTRAVENOUS | Status: DC
  Filled 2016-06-29: qty 2000

## 2016-06-29 MED ORDER — PIPERACILLIN-TAZOBACTAM 3.375 G IVPB 30 MIN
3.3750 g | Freq: Once | INTRAVENOUS | Status: DC
  Administered 2016-06-29: 3.375 g via INTRAVENOUS
  Filled 2016-06-29: qty 50

## 2016-06-29 MED ORDER — ALBUTEROL SULFATE (2.5 MG/3ML) 0.083% IN NEBU
2.5000 mg | INHALATION_SOLUTION | RESPIRATORY_TRACT | Status: DC | PRN
Start: 2016-06-29 — End: 2016-07-04
  Filled 2016-06-29: qty 3

## 2016-06-29 MED ORDER — FUROSEMIDE 10 MG/ML IJ SOLN
40.0000 mg | Freq: Two times a day (BID) | INTRAMUSCULAR | Status: DC
  Administered 2016-06-29 – 2016-06-30 (×2): 40 mg via INTRAVENOUS
  Filled 2016-06-29 (×3): qty 4

## 2016-06-29 NOTE — ED Notes (Signed)
The pt still has no pain  Nasal 02 at 4  He reports that he  Feels better

## 2016-06-29 NOTE — ED Provider Notes (Signed)
[facility] DEPT Provider Note   CSN: [id number] Arrival date & time: 06/29/16  1757     History   Chief Complaint Chief Complaint  Patient presents with  . Shortness of Breath    HPI Nicholas Caldwell is a 70 y.o. male.  Patient with history of COPD, congestive heart failure, high blood pressure, recent admission to the hospital presents with worsening dyspnea for the past 2 days. Difficulty obtaining details on exam due to mild somnolence. Patient denies chest pain. Exertional component. Nothing is improved his symptoms denies oxygen at home patient has history of tobacco abuse      Past Medical History:  Diagnosis Date  . Atrial flutter ([place])    a. recurrent AFlutter with RVR;  b. Amiodarone Rx started 4/16  . CAD (coronary artery disease)    a. LHC 1/16:  mLAD diffuse disease, pLCx mild disease, dLCx with disease but too small for PCI, RCA ok, EF 25-30%  . Chronic systolic CHF (congestive heart failure) ([place])   . COPD (chronic obstructive pulmonary disease) ([place])   . Diabetes mellitus without complication ([place])   . Hypercholesteremia   . Hypertension   . NICM (nonischemic cardiomyopathy) ([place])    a. Echo 4/16:  EF 25-30%, diffuse HK, mild MR, severe LAE  . Persistent atrial fibrillation ([place])   . Prolonged Q-T interval on ECG   . Tobacco abuse     Patient Active Problem List   Diagnosis Date Noted  . Normocytic anemia 06/29/2016  . Acute dyspnea 06/29/2016  . CAD (coronary artery disease) - Non-obstructive by LHC1/16 01/21/2016  . QT prolongation 10/09/2015  . Nonischemic cardiomyopathy ([place]) 10/09/2015  . Persistent atrial fibrillation ([place])   . Acute respiratory failure with hypoxia and hypercarbia ([place]) 02/19/2015  . Chronic pain 02/19/2015  . Morbid obesity ([place]) 02/13/2015  . Acute on chronic systolic CHF (congestive heart failure) ([place]) 11/02/2014  . Elevated troponin I level 11/01/2014  . COPD exacerbation ([place]) 11/01/2014  . Essential hypertension  10/31/2014  . Type 2 diabetes mellitus with diabetic neuropathy, without long-term current use of insulin ([place]) 10/31/2014  . Hyperlipidemia associated with type 2 diabetes mellitus ([place]) 10/31/2014  . Smoker 10/31/2014    Past Surgical History:  Procedure Laterality Date  . LEFT AND RIGHT HEART CATHETERIZATION WITH CORONARY ANGIOGRAM N/A 11/06/2014   Procedure: LEFT AND RIGHT HEART CATHETERIZATION WITH CORONARY ANGIOGRAM;  Surgeon: Jettie Booze, MD;  Location: [facility] CATH LAB;  Service: Cardiovascular;  Laterality: N/A;       Home Medications    Prior to Admission medications   Medication Sig Start Date End Date Taking? Authorizing Provider  gabapentin (NEURONTIN) 600 MG tablet Take 600-1,800 mg by mouth See admin instructions. Take 1 tablet (600 mg) by mouth daily with lunch, take 2 tablets (1200 mg) with supper and 3 tablets (1800 mg) at bedtime   Yes Historical Provider, MD  methadone (DOLOPHINE) 10 MG tablet Take 10 mg by mouth 4 (four) times daily.    Yes Historical Provider, MD  albuterol (PROVENTIL HFA;VENTOLIN HFA) 108 (90 BASE) MCG/ACT inhaler Inhale 2 puffs into the lungs every 6 (six) hours as needed for wheezing or shortness of breath. 11/07/14   Kinnie Feil, MD  amiodarone (PACERONE) 200 MG tablet Take 1 tablet (200 mg total) by mouth 2 (two) times daily. Patient not taking: Reported on 05/25/2016 01/21/16   Liliane Shi, PA-C  carvedilol (COREG) 25 MG tablet Take 1.5 tablets (37.5 mg total) by mouth 2 (two) times  daily with a meal. 01/21/16   Liliane Shi, PA-C  diazepam (VALIUM) 10 MG tablet Take 10 mg by mouth at bedtime.    Historical Provider, MD  diclofenac sodium (VOLTAREN) 1 % GEL Apply 2 g topically 4 (four) times daily. 02/23/15   Luan Moore, MD  digoxin (LANOXIN) 0.125 MG tablet Take 1 tablet (0.125 mg total) by mouth daily. 09/12/15   Liliane Shi, PA-C  doxycycline (VIBRA-TABS) 100 MG tablet Take 1 tablet (100 mg total) by mouth every 12 (twelve) hours.  05/26/16   Velvet Bathe, MD  furosemide (LASIX) 40 MG tablet Take 80 mg by mouth 2 (two) times daily.    Historical Provider, MD  glipiZIDE (GLUCOTROL) 10 MG tablet Take 20 mg by mouth 2 (two) times daily before a meal.    Historical Provider, MD  hydrALAZINE (APRESOLINE) 25 MG tablet Take 1 tablet (25 mg total) by mouth 3 (three) times daily. 09/12/15   Liliane Shi, PA-C  insulin detemir (LEVEMIR) 100 UNIT/ML injection Inject 44 Units into the skin at bedtime.    Historical Provider, MD  isosorbide mononitrate (IMDUR) 30 MG 24 hr tablet Take 1 tablet (30 mg total) by mouth daily. 10/09/15   Jettie Booze, MD  metFORMIN (GLUCOPHAGE) 1000 MG tablet Take 1,000 mg by mouth 2 (two) times daily with a meal.    Historical Provider, MD  metolazone (ZAROXOLYN) 2.5 MG tablet Take 2.5 mg by mouth daily.    Historical Provider, MD  potassium chloride SA (K-DUR,KLOR-CON) 20 MEQ tablet Take 2 tablets (40 mEq total) by mouth daily. 11/07/14   Kinnie Feil, MD  rivaroxaban (XARELTO) 20 MG TABS tablet Take 1 tablet (20 mg total) by mouth daily with supper. 02/23/15   Luan Moore, MD  rosuvastatin (CRESTOR) 20 MG tablet Take 20 mg by mouth daily.    Historical Provider, MD  sacubitril-valsartan (ENTRESTO) 49-51 MG Take 1 tablet by mouth 2 (two) times daily. 09/12/15   Liliane Shi, PA-C  spironolactone (ALDACTONE) 25 MG tablet Take 1 tablet (25 mg total) by mouth daily. 08/02/15   Liliane Shi, PA-C  Vitamin D, Ergocalciferol, (DRISDOL) 50000 UNITS CAPS capsule Take 50,000 Units by mouth every 7 (seven) days. Pt takes on Sunday.    Historical Provider, MD    Family History Family History  Problem Relation Age of Onset  . Heart disease Mother   . Hypertension Mother   . Heart failure Mother   . Heart disease Father   . Hypertension Sister   . Heart attack Neg Hx   . Stroke Neg Hx     Social History Social History  Substance Use Topics  . Smoking status: Current Every Day Smoker    Packs/day:  0.05    Types: Cigarettes    Last attempt to quit: 08/30/2015  . Smokeless tobacco: Never Used  . Alcohol use No     Allergies   Review of patient's allergies indicates no known allergies.   Review of Systems Review of Systems  Unable to perform ROS: Mental status change     Physical Exam Updated Vital Signs BP 105/80   Pulse 87   Resp 21   Ht 6' (1.829 m)   Wt 256 lb (116.1 kg)   SpO2 (!) 83%   BMI 34.72 kg/m   Physical Exam  Constitutional: He appears well-developed and well-nourished.  HENT:  Head: Normocephalic and atraumatic.  Eyes: Conjunctivae are normal. Right eye exhibits no discharge. Left eye exhibits  no discharge.  Neck: Normal range of motion. Neck supple. No tracheal deviation present.  Cardiovascular: Normal rate and regular rhythm.   Pulmonary/Chest: Rales: patient has mildwith increased work of breathing.  Abdominal: Soft. He exhibits no distension. There is no tenderness. There is no guarding.  Musculoskeletal: He exhibits edema (mild bilateral LE).  Neurological: He is alert.  General weakness and deconditioning, mild somnolence, alert to loud verbal, perrl  Skin: Skin is warm. No rash noted.  Psychiatric: He is slowed.  Nursing note and vitals reviewed.    ED Treatments / Results  Labs (all labs ordered are listed, but only abnormal results are displayed) Labs Reviewed  CBC WITH DIFFERENTIAL/PLATELET - Abnormal; Notable for the following:       Result Value   Hemoglobin 11.9 (*)    RDW 16.9 (*)    All other components within normal limits  COMPREHENSIVE METABOLIC PANEL - Abnormal; Notable for the following:    Glucose, Bld 139 (*)    Calcium 8.0 (*)    Albumin 3.0 (*)    All other components within normal limits  BRAIN NATRIURETIC PEPTIDE - Abnormal; Notable for the following:    B Natriuretic Peptide 660.8 (*)    All other components within normal limits  TROPONIN I - Abnormal; Notable for the following:    Troponin I 0.03 (*)     All other components within normal limits  I-STAT ARTERIAL BLOOD GAS, ED - Abnormal; Notable for the following:    pH, Arterial 7.251 (*)    pCO2 arterial 75.4 (*)    pO2, Arterial 49.0 (*)    Bicarbonate 33.1 (*)    Acid-Base Excess 3.0 (*)    All other components within normal limits  BLOOD GAS, ARTERIAL    EKG  EKG Interpretation None       Radiology Dg Chest Portable 1 View  Result Date: 06/29/2016 CLINICAL DATA:  69 y/o M; increasing shortness of breath with history of COPD and smoking. EXAM: PORTABLE CHEST 1 VIEW COMPARISON:  05/25/2016 chest radiograph. FINDINGS: Stable cardiomediastinal silhouette given projection and technique. Interval increase in right greater than left perihilar and basilar opacities and blunted costophrenic angles in comparison with prior radiographs. No acute osseous abnormality is evident. IMPRESSION: Increasing right greater than left and basilar opacities probably represents pulmonary edema, possibly pneumonia. Small bilateral pleural effusions. Electronically Signed   By: Kristine Garbe M.D.   On: 06/29/2016 20:35    Procedures Procedures (including critical care time) CRITICAL CARE Performed by: Mariea Clonts   Total critical care time: 35 minutes  Critical care time was exclusive of separately billable procedures and treating other patients.  Critical care was necessary to treat or prevent imminent or life-threatening deterioration.  Critical care was time spent personally by me on the following activities: development of treatment plan with patient and/or surrogate as well as nursing, discussions with consultants, evaluation of patient's response to treatment, examination of patient, obtaining history from patient or surrogate, ordering and performing treatments and interventions, ordering and review of laboratory studies, ordering and review of radiographic studies, pulse oximetry and re-evaluation of patient's  condition.  Medications Ordered in ED Medications  furosemide (LASIX) injection 40 mg (not administered)  albuterol (PROVENTIL) (2.5 MG/3ML) 0.083% nebulizer solution 2.5 mg (not administered)  methylPREDNISolone sodium succinate (SOLU-MEDROL) 125 mg/2 mL injection 125 mg (125 mg Intravenous Given 06/29/16 2154)     Initial Impression / Assessment and Plan / ED Course  I have reviewed the triage  vital signs and the nursing notes.  Pertinent labs & imaging results that were available during my care of the patient were reviewed by me and considered in my medical decision making (see chart for details).  Clinical Course   Patient presents with worsening shortness of breath clinical concern for accommodation of COPD and heart failure. Radiologist reported possible pneumonia with worsening chest x-ray. Patient has had mild cough but no fever or chills. Discussed with triad hospitalist plan hold antibiotics and monitor treatment response. ABG confirms hypercapnia, bipap.  The patients results and plan were reviewed and discussed.   Any x-rays performed were independently reviewed by myself.   Differential diagnosis were considered with the presenting HPI.  Medications  furosemide (LASIX) injection 40 mg (not administered)  albuterol (PROVENTIL) (2.5 MG/3ML) 0.083% nebulizer solution 2.5 mg (not administered)  methylPREDNISolone sodium succinate (SOLU-MEDROL) 125 mg/2 mL injection 125 mg (125 mg Intravenous Given 06/29/16 2154)    Vitals:   06/29/16 2115 06/29/16 2145 06/29/16 2200 06/29/16 2215  BP: 101/63 109/69  105/80  Pulse: 85 86  87  Resp: _0 SpO2: 92% 96%  (!) 83%  Weight:   256 lb (116.1 kg)   Height:   6' (1.829 m)     Final diagnoses:  Acute dyspnea  Hypercarbia    Admission/ observation were discussed with the admitting physician, patient and/or family and they are comfortable with the plan.    Final Clinical Impressions(s) / ED Diagnoses   Final  diagnoses:  Acute dyspnea  Hypercarbia    New Prescriptions New Prescriptions   No medications on file     Elnora Morrison, MD 06/29/16 2233

## 2016-06-29 NOTE — ED Notes (Signed)
The admitting doctor is at the bedside  The doctor does not want it one while he talks to the pt

## 2016-06-29 NOTE — ED Notes (Signed)
The pt is sleeping unless disturbed  He wales up and partially answers questions and reports that  He is so sleepy because he did not sleep last pm  sats remain 90-91% on 2 likters of nasal 0o2

## 2016-06-29 NOTE — H&P (Signed)
History and Physical  Patient Name: Nicholas Caldwell     LTJ:[id number]    DOB: 1946-09-21    DOA: 06/29/2016 PCP: [facility]   Patient coming from: Home via EMS  Chief Complaint: Dyspnea  HPI: Nicholas Caldwell is a 70 y.o. male with a past medical history significant for NICM and chronic systolic CHF EF 07-62%, COPD not on home O2, Afib on Xarelto, NIDDM and chronic pain on methadone who presents with dyspnea.  The patient reports worsening shortness breath with exertion over the last week as well as leg swelling.  He denies fever, chills, rigors.  Has a chronic productive cough, worse with shortness of breath lately.  Chronic orthopnea and leg swelling, worse lately, as well as with new dyspnea waking him from sleep.  Today his shortness of breath got acutely worse, was not improved with home albuterol and so he called EMS.  EMS found him wheezy and gave albuterol en route.  ED course: -Afebrile, heart rate normal, respirations slow and patient sleepy, BP normal, hypoxic on room air intermittently, improved with supplemental O2 via BiPAP -Na 137, K 3.8, Cr 0.7 (baseline), WBC 7.7K, Hgb 11.9 (normocytic and at baseline) -BNP 660 pg/mL, and troponin 0.03 ng/mL -CXR showed bilateral opacities, favoring edema rather than pneumonia -He was wheezing and given Solumedrol in the ER and TRH were asked to evaluate for admission  He was recently admitted for acute on chronic SOB, treated as OBS status for COPD flare and discharged wtihin 24 hours with doxycycline and prednisone.  He is unsure if he takes this.  At the time, he reported taking methadone on an "as needed basis".  At present, he is unclear about what medicines he takes or when, and family are unavailable for corroboration.      ROS: Review of Systems  Constitutional: Negative for chills and fever.  Respiratory: Positive for cough, sputum production, shortness of breath and wheezing. Negative for hemoptysis.   Cardiovascular:  Positive for orthopnea (chronic), leg swelling (worse lately) and PND (worse lately). Negative for chest pain and palpitations.  All other systems reviewed and are negative.     Past Medical History:  Diagnosis Date  . Atrial flutter ([place])    a. recurrent AFlutter with RVR;  b. Amiodarone Rx started 4/16  . CAD (coronary artery disease)    a. LHC 1/16:  mLAD diffuse disease, pLCx mild disease, dLCx with disease but too small for PCI, RCA ok, EF 25-30%  . Chronic systolic CHF (congestive heart failure) ([place])   . COPD (chronic obstructive pulmonary disease) ([place])   . Diabetes mellitus without complication ([place])   . Hypercholesteremia   . Hypertension   . NICM (nonischemic cardiomyopathy) ([place])    a. Echo 4/16:  EF 25-30%, diffuse HK, mild MR, severe LAE  . Persistent atrial fibrillation ([place])   . Prolonged Q-T interval on ECG   . Tobacco abuse     Past Surgical History:  Procedure Laterality Date  . LEFT AND RIGHT HEART CATHETERIZATION WITH CORONARY ANGIOGRAM N/A 11/06/2014   Procedure: LEFT AND RIGHT HEART CATHETERIZATION WITH CORONARY ANGIOGRAM;  Surgeon: Jettie Booze, MD;  Location: [facility] CATH LAB;  Service: Cardiovascular;  Laterality: N/A;    Social History: Patient lives with his wife.  The patient walks unassisted, but is sedentary because of baseline SOB from CHF.  He is a former smoker.  Used to work in [employer].  No Known Allergies  Family history: family history includes Heart disease in  his father and mother; Heart failure in his mother; Hypertension in his mother and sister.  Prior to Admission medications   Medication Sig Start Date End Date Taking? Authorizing Provider  gabapentin (NEURONTIN) 600 MG tablet Take 600-1,800 mg by mouth See admin instructions. Take 1 tablet (600 mg) by mouth daily with lunch, take 2 tablets (1200 mg) with supper and 3 tablets (1800 mg) at bedtime   Yes Historical Provider, MD  methadone (DOLOPHINE) 10 MG tablet Take 10 mg by  mouth 4 (four) times daily.    Yes Historical Provider, MD  albuterol (PROVENTIL HFA;VENTOLIN HFA) 108 (90 BASE) MCG/ACT inhaler Inhale 2 puffs into the lungs every 6 (six) hours as needed for wheezing or shortness of breath. 11/07/14   Kinnie Feil, MD  amiodarone (PACERONE) 200 MG tablet Take 1 tablet (200 mg total) by mouth 2 (two) times daily. Patient not taking: Reported on 05/25/2016 01/21/16   Liliane Shi, PA-C  carvedilol (COREG) 25 MG tablet Take 1.5 tablets (37.5 mg total) by mouth 2 (two) times daily with a meal. 01/21/16   Liliane Shi, PA-C  diazepam (VALIUM) 10 MG tablet Take 10 mg by mouth at bedtime.    Historical Provider, MD  diclofenac sodium (VOLTAREN) 1 % GEL Apply 2 g topically 4 (four) times daily. 02/23/15   Luan Moore, MD  digoxin (LANOXIN) 0.125 MG tablet Take 1 tablet (0.125 mg total) by mouth daily. 09/12/15   Liliane Shi, PA-C  doxycycline (VIBRA-TABS) 100 MG tablet Take 1 tablet (100 mg total) by mouth every 12 (twelve) hours. 05/26/16   Velvet Bathe, MD  furosemide (LASIX) 40 MG tablet Take 80 mg by mouth 2 (two) times daily.    Historical Provider, MD  glipiZIDE (GLUCOTROL) 10 MG tablet Take 20 mg by mouth 2 (two) times daily before a meal.    Historical Provider, MD  hydrALAZINE (APRESOLINE) 25 MG tablet Take 1 tablet (25 mg total) by mouth 3 (three) times daily. 09/12/15   Liliane Shi, PA-C  insulin detemir (LEVEMIR) 100 UNIT/ML injection Inject 44 Units into the skin at bedtime.    Historical Provider, MD  isosorbide mononitrate (IMDUR) 30 MG 24 hr tablet Take 1 tablet (30 mg total) by mouth daily. 10/09/15   Jettie Booze, MD  metFORMIN (GLUCOPHAGE) 1000 MG tablet Take 1,000 mg by mouth 2 (two) times daily with a meal.    Historical Provider, MD  metolazone (ZAROXOLYN) 2.5 MG tablet Take 2.5 mg by mouth daily.    Historical Provider, MD  potassium chloride SA (K-DUR,KLOR-CON) 20 MEQ tablet Take 2 tablets (40 mEq total) by mouth daily. 11/07/14   Kinnie Feil, MD  rivaroxaban (XARELTO) 20 MG TABS tablet Take 1 tablet (20 mg total) by mouth daily with supper. 02/23/15   Luan Moore, MD  rosuvastatin (CRESTOR) 20 MG tablet Take 20 mg by mouth daily.    Historical Provider, MD  sacubitril-valsartan (ENTRESTO) 49-51 MG Take 1 tablet by mouth 2 (two) times daily. 09/12/15   Liliane Shi, PA-C  spironolactone (ALDACTONE) 25 MG tablet Take 1 tablet (25 mg total) by mouth daily. 08/02/15   Liliane Shi, PA-C  Vitamin D, Ergocalciferol, (DRISDOL) 50000 UNITS CAPS capsule Take 50,000 Units by mouth every 7 (seven) days. Pt takes on Sunday.    Historical Provider, MD       Physical Exam: BP 109/69   Pulse 86   Resp 17   Ht 6' (1.829 m)  Wt 116.1 kg (256 lb)   SpO2 96%   BMI 34.72 kg/m  General appearance: Well-developed, obese adult male, awake but sleepy, in no acute distress.   Eyes: Anicteric, conjunctiva pink, lids and lashes normal.     ENT: No nasal deformity, discharge, or epistaxis.  OP moist without lesions.   Lymph: No cervical or supraclavicular lymphadenopathy. Skin: Warm and dry.  No jaundice.  No suspicious rashes or lesions. Cardiac: RRR, nl S1-S2, no murmurs appreciated.  Capillary refill is brisk.  JVP not visible.  Marked 2+ LE edema to knees.  Radial and DP pulses 2+ and symmetric. Respiratory: No increased respiratory effort. Wheezes bilaterally. Rales at bilateral bases. GI: Abdomen soft without rigidity.  No TTP. No ascites, distension, hepatosplenomegaly.   MSK: No deformities or effusions.  No clubbing/cyanosis. Neuro: Cranial nerves normal. Strength 5/5 bilaterally.  Sensorium intact and responding to questions, attention diminished and somewhat somnolent, but oriented.  Speech is fluent.  Moves all extremities equally and with normal coordination.    Psych: Affect blunted.  No evidence of aural or visual hallucinations.    Labs on Admission:  I have personally reviewed following labs and imaging  studies: CBC:  Recent Labs Lab 06/29/16 1946  WBC 7.7  NEUTROABS 6.2  HGB 11.9*  HCT 39.5  MCV 87.6  PLT 017   Basic Metabolic Panel:  Recent Labs Lab 06/29/16 1946  NA 137  K 3.8  CL 103  CO2 27  GLUCOSE 139*  BUN 9  CREATININE 0.69  CALCIUM 8.0*   GFR: Estimated Creatinine Clearance: 116.3 mL/min (by C-G formula based on SCr of 0.8 mg/dL).  Liver Function Tests:  Recent Labs Lab 06/29/16 1946  AST 22  ALT 23  ALKPHOS 107  BILITOT 0.6  PROT 6.6  ALBUMIN 3.0*   Cardiac Enzymes:  Recent Labs Lab 06/29/16 1946  TROPONINI 0.03*   BNP (last 3 results)  Recent Labs  08/02/15 1714  PROBNP 363.0*        Radiological Exams on Admission: Personally reviewed: Dg Chest Portable 1 View  Result Date: 06/29/2016 CLINICAL DATA:  70 y/o M; increasing shortness of breath with history of COPD and smoking. EXAM: PORTABLE CHEST 1 VIEW COMPARISON:  05/25/2016 chest radiograph. FINDINGS: Stable cardiomediastinal silhouette given projection and technique. Interval increase in right greater than left perihilar and basilar opacities and blunted costophrenic angles in comparison with prior radiographs. No acute osseous abnormality is evident. IMPRESSION: Increasing right greater than left and basilar opacities probably represents pulmonary edema, possibly pneumonia. Small bilateral pleural effusions. Electronically Signed   By: Kristine Garbe M.D.   On: 06/29/2016 20:35    EKG: Independently reviewed. Rate 87, atrial flutter.  QT interval hard to interpret.  TW flattening in anterior leads.  Unchanged from previous.  Echocardiogram 2016: EF 25-30% No significant valvular disease.       Assessment/Plan 1. Acute on chronic hypoxic and hypercarbic respiratory failure:  Multifactorial from acute on chronic systolic CHF, COPD flare, possible methadone misuse, or sleep-disordered breathing.  Suspect baseline pCO2 ~55-60. -BiPAP overnight -Repeat ABG in 6  hours and titrate BiPAP at that time -Hold methadone until dosing can be confirmed, strongly consider stopping -Reduce diazepam to 5 mg QHS -CHF treatment as below -COPD treatment as below     2. Acute on chronic systolic CHF:  EF 51%.  States baseline weight is 260, does not weigh daily. -Continue home carvedilol, hydralazine, Entresto, spironolactone, statin -Furosemide 40 mg twice daily -Strict I/Os, daily  weights, close daily monitoring of BMP  3. COPD with acute exacerbation:  -Prednisone 40 mg daily for 5 days -Albuterol scheduled and PRN -Check procalcitonin now and in 48 hours per protocol  4. Elevated troponin:  Suspect this is from CHF. -Trend troponin overnight  5. NIDDM with neuropathy and chronic pain:  -Hold glipizide and metformin -High dose sliding scale corrections -Continue levemir at 30 units nightly (from 44 units nightly at home) while on SSI -Hold methadone until his dosing can be confirmed with his wife or PCP -Continue gabapentin 600 mg TID  6. Normocytic anemia:  Chronic, stable.    7. History of prolonged QTc:  QTc hard to interpret on ECG given flutter but appears long, likely from methadone.  8. Atrial fibrillation: CHADS2-VASc 5. On Xarelto. Rate controlled. -Continue Xarelto -Continue carvedilol and digoxin -Check digoxin level  9. Questionable history of seizures: Patient states he takes diazepam 10 mg "to prevent seizures". -Diazepam 5 mg QHS, reduced dose given respiratory suppression      DVT prophylaxis: None needed, on rivaroxaban  Code Status: FULL  Family Communication: Called to wife, no answer no VM available.  Disposition Plan: Anticipate BiPAP overnight and repeat ABG in 6 hours.  Continue diuresis and COPD treatment and anticipate 3-4 days admission. Consults called: None overnight Admission status: INPATIENT, stepdown    Medical decision making: Patient seen at 10:14 PM on 06/29/2016.  The patient was discussed  with Dr. Reather Converse. What exists of the patient's chart was reviewed in depth.  Clinical condition: stable but requiring continuous BiPAP given hypercarbia and somnolence.        Edwin Dada [facility] Pager [phone]

## 2016-06-29 NOTE — ED Notes (Signed)
edp saw 15 minutes ago   The pts sats are 90-91 on 2 liters of nasal 02  Pt continues to be drowsy  But oriented skin warm and d ry

## 2016-06-29 NOTE — ED Notes (Signed)
Elevated triponin  Given to the edp

## 2016-06-29 NOTE — ED Notes (Signed)
The pt arrived by gems from home sob today was sitting on the porch when ems arrived.  The pt reports that he has had more sob since yesterday.  He also reports that he stopped smoking 2 months ago he has had a p[roductive cough  Green thick  Wheezes in his upper luings  He is very sleepy  hhn with albuterol 5.0  Going by ems when he arrived

## 2016-06-30 LAB — BLOOD GAS, ARTERIAL
ACID-BASE EXCESS: 6.9 mmol/L — AB (ref 0.0–2.0)
Acid-Base Excess: 6.2 mmol/L — ABNORMAL HIGH (ref 0.0–2.0)
BICARBONATE: 32.6 meq/L — AB (ref 20.0–24.0)
Bicarbonate: 32.2 mEq/L — ABNORMAL HIGH (ref 20.0–24.0)
Delivery systems: POSITIVE
Drawn by: 460981
Drawn by: 406621
EXPIRATORY PAP: 5
Expiratory PAP: 4
FIO2: 40
FIO2: 40
INSPIRATORY PAP: 14
INSPIRATORY PAP: 10
LHR: 12 {breaths}/min
LHR: 12 {breaths}/min
Mode: POSITIVE
O2 SAT: 94.6 %
O2 Saturation: 96.2 %
PATIENT TEMPERATURE: 98.6
PCO2 ART: 71.3 mmHg — AB (ref 35.0–45.0)
PCO2 ART: 58 mmHg — AB (ref 35.0–45.0)
PH ART: 7.283 — AB (ref 7.350–7.450)
TCO2: 34.8 mmol/L (ref 0–100)
TCO2: 34 mmol/L (ref 0–100)
pH, Arterial: 7.363 (ref 7.350–7.450)
pO2, Arterial: 86.8 mmHg (ref 80.0–100.0)
pO2, Arterial: 88.6 mmHg (ref 80.0–100.0)

## 2016-06-30 LAB — CBC
HEMATOCRIT: 39.7 % (ref 39.0–52.0)
HEMOGLOBIN: 12 g/dL — AB (ref 13.0–17.0)
MCH: 26.4 pg (ref 26.0–34.0)
MCHC: 30.2 g/dL (ref 30.0–36.0)
MCV: 87.3 fL (ref 78.0–100.0)
Platelets: 328 10*3/uL (ref 150–400)
RBC: 4.55 MIL/uL (ref 4.22–5.81)
RDW: 16.9 % — ABNORMAL HIGH (ref 11.5–15.5)
WBC: 6.7 10*3/uL (ref 4.0–10.5)

## 2016-06-30 LAB — GLUCOSE, CAPILLARY
GLUCOSE-CAPILLARY: 204 mg/dL — AB (ref 65–99)
Glucose-Capillary: 170 mg/dL — ABNORMAL HIGH (ref 65–99)
Glucose-Capillary: 202 mg/dL — ABNORMAL HIGH (ref 65–99)
Glucose-Capillary: 173 mg/dL — ABNORMAL HIGH (ref 65–99)
Glucose-Capillary: 249 mg/dL — ABNORMAL HIGH (ref 65–99)

## 2016-06-30 LAB — BASIC METABOLIC PANEL
Anion gap: 7 (ref 5–15)
BUN: 11 mg/dL (ref 6–20)
CHLORIDE: 100 mmol/L — AB (ref 101–111)
CO2: 31 mmol/L (ref 22–32)
Calcium: 8.2 mg/dL — ABNORMAL LOW (ref 8.9–10.3)
Creatinine, Ser: 0.8 mg/dL (ref 0.61–1.24)
GFR calc Af Amer: >60 mL/min (ref >60)
GFR calc non Af Amer: >60 mL/min (ref >60)
GLUCOSE: 179 mg/dL — AB (ref 65–99)
POTASSIUM: 4.4 mmol/L (ref 3.5–5.1)
SODIUM: 138 mmol/L (ref 135–145)

## 2016-06-30 LAB — PROCALCITONIN: Procalcitonin: <0.1 ng/mL

## 2016-06-30 LAB — DIGOXIN LEVEL

## 2016-06-30 LAB — TROPONIN I
TROPONIN I: 0.04 ng/mL — AB (ref <0.03)
TROPONIN I: 0.03 ng/mL — AB (ref <0.03)

## 2016-06-30 LAB — MRSA PCR SCREENING: MRSA by PCR: NEGATIVE

## 2016-06-30 MED ORDER — GABAPENTIN 600 MG PO TABS
1200.0000 mg | ORAL_TABLET | Freq: Every day | ORAL | Status: DC
  Administered 2016-06-30 – 2016-07-03 (×4): 1200 mg via ORAL
  Filled 2016-06-30 (×5): qty 2

## 2016-06-30 MED ORDER — HYDRALAZINE HCL 25 MG PO TABS
25.0000 mg | ORAL_TABLET | Freq: Three times a day (TID) | ORAL | Status: DC
  Administered 2016-06-30 – 2016-07-04 (×12): 25 mg via ORAL
  Filled 2016-06-30 (×14): qty 1

## 2016-06-30 MED ORDER — POTASSIUM CHLORIDE CRYS ER 20 MEQ PO TBCR
40.0000 meq | EXTENDED_RELEASE_TABLET | Freq: Two times a day (BID) | ORAL | Status: DC
  Administered 2016-06-30 – 2016-07-04 (×10): 40 meq via ORAL
  Filled 2016-06-30 (×12): qty 2

## 2016-06-30 MED ORDER — CARVEDILOL 12.5 MG PO TABS
37.5000 mg | ORAL_TABLET | Freq: Two times a day (BID) | ORAL | Status: DC
  Administered 2016-06-30 – 2016-07-04 (×9): 37.5 mg via ORAL
  Filled 2016-06-30 (×10): qty 1

## 2016-06-30 MED ORDER — GABAPENTIN 600 MG PO TABS: 1800.0000 mg | ORAL_TABLET | Freq: Every day | ORAL | Status: DC

## 2016-06-30 MED ORDER — GABAPENTIN 600 MG PO TABS: 1200.0000 mg | ORAL_TABLET | Freq: Every day | ORAL | Status: DC

## 2016-06-30 MED ORDER — PREDNISONE 20 MG PO TABS
40.0000 mg | ORAL_TABLET | Freq: Every day | ORAL | Status: DC
  Administered 2016-06-30 – 2016-07-01 (×2): 40 mg via ORAL
  Filled 2016-06-30 (×2): qty 2

## 2016-06-30 MED ORDER — OXYCODONE HCL 5 MG PO TABS
5.0000 mg | ORAL_TABLET | Freq: Two times a day (BID) | ORAL | Status: DC
  Administered 2016-06-30 – 2016-07-01 (×3): 5 mg via ORAL
  Filled 2016-06-30 (×4): qty 1

## 2016-06-30 MED ORDER — SENNOSIDES-DOCUSATE SODIUM 8.6-50 MG PO TABS
1.0000 | ORAL_TABLET | Freq: Every evening | ORAL | Status: DC | PRN
  Filled 2016-06-30: qty 1

## 2016-06-30 MED ORDER — DIGOXIN 125 MCG PO TABS
0.1250 mg | ORAL_TABLET | Freq: Every day | ORAL | Status: DC
  Administered 2016-06-30 – 2016-07-04 (×5): 0.125 mg via ORAL
  Filled 2016-06-30 (×5): qty 1

## 2016-06-30 MED ORDER — ACETAMINOPHEN 325 MG PO TABS: 650.0000 mg | ORAL_TABLET | Freq: Four times a day (QID) | ORAL | Status: DC | PRN

## 2016-06-30 MED ORDER — INSULIN ASPART 100 UNIT/ML ~~LOC~~ SOLN
0.0000 [IU] | Freq: Three times a day (TID) | SUBCUTANEOUS | Status: DC
  Administered 2016-06-30: 7 [IU] via SUBCUTANEOUS
  Administered 2016-06-30 (×2): 4 [IU] via SUBCUTANEOUS
  Administered 2016-07-01: 7 [IU] via SUBCUTANEOUS
  Administered 2016-07-01: 3 [IU] via SUBCUTANEOUS
  Administered 2016-07-01 – 2016-07-03 (×3): 4 [IU] via SUBCUTANEOUS
  Administered 2016-07-03: 7 [IU] via SUBCUTANEOUS
  Administered 2016-07-04: 4 [IU] via SUBCUTANEOUS

## 2016-06-30 MED ORDER — GABAPENTIN 600 MG PO TABS: 600.0000 mg | ORAL_TABLET | Freq: Three times a day (TID) | ORAL | Status: DC

## 2016-06-30 MED ORDER — SPIRONOLACTONE 25 MG PO TABS
25.0000 mg | ORAL_TABLET | Freq: Every day | ORAL | Status: DC
  Administered 2016-06-30 – 2016-07-04 (×5): 25 mg via ORAL
  Filled 2016-06-30 (×5): qty 1

## 2016-06-30 MED ORDER — ROSUVASTATIN CALCIUM 10 MG PO TABS
20.0000 mg | ORAL_TABLET | Freq: Every day | ORAL | Status: DC
  Administered 2016-06-30 – 2016-07-03 (×4): 20 mg via ORAL
  Filled 2016-06-30: qty 1
  Filled 2016-06-30: qty 2
  Filled 2016-06-30: qty 1
  Filled 2016-06-30 (×2): qty 2

## 2016-06-30 MED ORDER — GABAPENTIN 600 MG PO TABS
600.0000 mg | ORAL_TABLET | Freq: Every day | ORAL | Status: DC
  Administered 2016-07-01 – 2016-07-03 (×3): 600 mg via ORAL
  Filled 2016-06-30 (×4): qty 1

## 2016-06-30 MED ORDER — OXYCODONE HCL 5 MG PO TABS
5.0000 mg | ORAL_TABLET | Freq: Four times a day (QID) | ORAL | Status: DC
  Administered 2016-06-30: 5 mg via ORAL
  Filled 2016-06-30: qty 1

## 2016-06-30 MED ORDER — CHLORHEXIDINE GLUCONATE 0.12 % MT SOLN
15.0000 mL | Freq: Two times a day (BID) | OROMUCOSAL | Status: DC
  Administered 2016-06-30 – 2016-07-04 (×10): 15 mL via OROMUCOSAL
  Filled 2016-06-30 (×10): qty 15

## 2016-06-30 MED ORDER — RIVAROXABAN 20 MG PO TABS
20.0000 mg | ORAL_TABLET | Freq: Every day | ORAL | Status: DC
  Administered 2016-06-30 – 2016-07-03 (×4): 20 mg via ORAL
  Filled 2016-06-30 (×5): qty 1

## 2016-06-30 MED ORDER — INSULIN ASPART 100 UNIT/ML ~~LOC~~ SOLN
0.0000 [IU] | Freq: Every day | SUBCUTANEOUS | Status: DC
  Administered 2016-06-30: 2 [IU] via SUBCUTANEOUS
  Administered 2016-07-01: 3 [IU] via SUBCUTANEOUS
  Administered 2016-07-02: 2 [IU] via SUBCUTANEOUS

## 2016-06-30 MED ORDER — INSULIN DETEMIR 100 UNIT/ML ~~LOC~~ SOLN
30.0000 [IU] | Freq: Every day | SUBCUTANEOUS | Status: DC
  Administered 2016-06-30 – 2016-07-02 (×3): 30 [IU] via SUBCUTANEOUS
  Filled 2016-06-30 (×5): qty 0.3

## 2016-06-30 MED ORDER — SACUBITRIL-VALSARTAN 49-51 MG PO TABS
1.0000 | ORAL_TABLET | Freq: Two times a day (BID) | ORAL | Status: DC
  Administered 2016-06-30 – 2016-07-04 (×10): 1 via ORAL
  Filled 2016-06-30 (×11): qty 1

## 2016-06-30 MED ORDER — ACETAMINOPHEN 650 MG RE SUPP: 650.0000 mg | Freq: Four times a day (QID) | RECTAL | Status: DC | PRN

## 2016-06-30 MED ORDER — ALBUTEROL SULFATE (2.5 MG/3ML) 0.083% IN NEBU
2.5000 mg | INHALATION_SOLUTION | Freq: Four times a day (QID) | RESPIRATORY_TRACT | Status: DC
Start: 2016-06-30 — End: 2016-07-01
  Administered 2016-06-30 – 2016-07-01 (×8): 2.5 mg via RESPIRATORY_TRACT
  Filled 2016-06-30 (×8): qty 3

## 2016-06-30 MED ORDER — DIAZEPAM 5 MG PO TABS
5.0000 mg | ORAL_TABLET | Freq: Every day | ORAL | Status: DC
  Administered 2016-07-02 – 2016-07-03 (×2): 5 mg via ORAL
  Filled 2016-06-30 (×2): qty 1

## 2016-06-30 MED ORDER — ISOSORBIDE MONONITRATE ER 30 MG PO TB24
30.0000 mg | ORAL_TABLET | Freq: Every day | ORAL | Status: DC
  Administered 2016-06-30 – 2016-07-04 (×5): 30 mg via ORAL
  Filled 2016-06-30 (×5): qty 1

## 2016-06-30 MED ORDER — FUROSEMIDE 10 MG/ML IJ SOLN
120.0000 mg | Freq: Two times a day (BID) | INTRAVENOUS | Status: DC
  Administered 2016-06-30 – 2016-07-01 (×3): 120 mg via INTRAVENOUS
  Filled 2016-06-30 (×4): qty 12

## 2016-06-30 MED ORDER — DICLOFENAC SODIUM 1 % TD GEL: 2.0000 g | Freq: Four times a day (QID) | TRANSDERMAL | Status: DC | PRN

## 2016-06-30 MED ORDER — ORAL CARE MOUTH RINSE
15.0000 mL | Freq: Two times a day (BID) | OROMUCOSAL | Status: DC
  Administered 2016-07-01 (×2): 15 mL via OROMUCOSAL

## 2016-06-30 MED ORDER — GABAPENTIN 400 MG PO CAPS
800.0000 mg | ORAL_CAPSULE | Freq: Every day | ORAL | Status: DC
  Administered 2016-06-30 – 2016-07-01 (×2): 800 mg via ORAL
  Filled 2016-06-30 (×3): qty 2

## 2016-06-30 NOTE — ED Notes (Signed)
Pt requesting to be placed back on bipap

## 2016-06-30 NOTE — Progress Notes (Signed)
CRITICAL VALUE ALERT  Critical value received: pH 7.283, PCo2 71.3, PO2 86.8, Bicarb 32.6  Date of notification: 06/30/16  Time of notification: 0612  Critical value read back: Yes  Nurse who received alert: Polly Cobia RN   MD notified (1st page): MD Danford   Time of first page:  0630  MD notified (2nd page):  Time of second page:  Responding MD: MD Loleta Books  Time MD responded:  0630  MD Danford, ordered for patient to continue BiPap for now and will obtain another ABG later on today. Will continue to monitor and assess.

## 2016-06-30 NOTE — Progress Notes (Signed)
Called Dr. Thereasa Solo and reported previous lethargy on Nasal Canula 2 liters. Pt is back on Bipap. Dr. Thereasa Solo ordered an ABG.

## 2016-06-30 NOTE — Progress Notes (Signed)
Around 1200 pt was lethargic and didn't answer questions appropriately. Called RT and pt was put back on bipap. Arterial gas to be drawn at 1400.

## 2016-06-30 NOTE — Progress Notes (Signed)
Pt off bipap per RN at this time. Pt in no distress and stable currently. RT will continue to monitor.

## 2016-06-30 NOTE — Progress Notes (Signed)
[place] TEAM 1 - Stepdown/ICU TEAM  Nicholas Caldwell  XTK:[id number] DOB: 03-30-1946 DOA: 06/29/2016 PCP: [facility]    Brief Narrative:  70 y.o. male with a history of NICM and chronic systolic CHF EF 99-24%, COPD not on home O2, Afib on Xarelto, NIDDM, and chronic pain on methadone who presented with dyspnea.  The patient reported worsening shortness breath with exertion over a week as well as leg swelling.  His shortness of breath got acutely worse, and did not improve with home albuterol, so he called EMS.   In the ED he was hypoxic on room air, improved with supplemental O2 via BiPAP.  Subjective: The pt is awake but somewhat slow to respond to questions.  He is in no apparent respiratory distress.  He denies cp, n/v, or abdom pain.  He states his SOB is improved.    Assessment & Plan:  Acute on chronic hypoxic and hypercarbic respiratory failure  acute on chronic systolic CHF +/- COPD flare - wean off BIPAP - cont to diurese - see individual issues below   Acute on chronic systolic CHF  EF 26% - states baseline weight is 260 (~117kg) - currently well above this - cont to diurese and follow Is/Os    Filed Weights   06/29/16 2200 06/30/16 0226  Weight: 116.1 kg (256 lb) 127.1 kg (280 lb 3.3 oz)    COPD with acute exacerbation No wheezing this morning - taper steroids - follow w/ scheduled nebs   Elevated troponin Suspect this is from CHF - follow to peak - no chest pain   DM with neuropathy Lower neurontin dose due to lethargy - follow  Chronic pain I have confirmed in the [facility] Narcotic Registry that the pt is prescribed methadone for 83m QID dosing at [place] pain clinic [place]- given his prolonged QT I will substitute oxycodone and at a much lower dose due to his lethargy   Normocytic anemia Chronic, stable   History of prolonged QTc likely from methadone - holding methadone - follow on tele - recheck 12lead in AM   Atrial fibrillation CHADS2-VASc 5 on  Xarelto - rate controlled  Questionable history of seizures Patient states he takes diazepam 10 mg "to prevent seizures" - Diazepam reduced dose given respiratory suppression   DVT prophylaxis: xarelto  Code Status: FULL CODE Family Communication: no family present at time of exam  Disposition Plan: SDU for another 24hrs to assure BIPAP not still needed   Consultants:  none  Procedures: none  Antimicrobials:  Zosyn 8/27 >  Objective: Blood pressure (!) 143/94, pulse 96, temperature 98 F (36.7 C), temperature source Oral, resp. rate 20, height 6' (1.829 m), weight 127.1 kg (280 lb 3.3 oz), SpO2 97 %.  Intake/Output Summary (Last 24 hours) at 06/30/16 0904 Last data filed at 06/30/16 0800  Gross per 24 hour  Intake              240 ml  Output             1250 ml  Net            -1010 ml   Filed Weights   06/29/16 2200 06/30/16 0226  Weight: 116.1 kg (256 lb) 127.1 kg (280 lb 3.3 oz)    Examination: General: No acute respiratory distress - lethargic but responsive  Lungs: Clear to auscultation bilaterally without wheezes or crackles - distant BS th/o  Cardiovascular: Irreg irreg w/o gallup or rub  Abdomen: Nontender, nondistended,  soft, bowel sounds positive, no rebound, no ascites, no appreciable mass Extremities: No significant cyanosis, or clubbing - 2+ pitting edema bilateral lower extremities  CBC:  Recent Labs Lab 06/29/16 1946 06/30/16 0815  WBC 7.7 6.7  NEUTROABS 6.2  --   HGB 11.9* 12.0*  HCT 39.5 39.7  MCV 87.6 87.3  PLT 341 301   Basic Metabolic Panel:  Recent Labs Lab 06/29/16 1946  NA 137  K 3.8  CL 103  CO2 27  GLUCOSE 139*  BUN 9  CREATININE 0.69  CALCIUM 8.0*   GFR: Estimated Creatinine Clearance: 121.8 mL/min (by C-G formula based on SCr of 0.8 mg/dL).  Liver Function Tests:  Recent Labs Lab 06/29/16 1946  AST 22  ALT 23  ALKPHOS 107  BILITOT 0.6  PROT 6.6  ALBUMIN 3.0*   Cardiac Enzymes:  Recent Labs Lab  06/29/16 1946 06/30/16 0311  TROPONINI 0.03* 0.04*    HbA1C: Hgb A1c MFr Bld  Date/Time Value Ref Range Status  02/19/2015 12:21 PM 9.8 (H) 4.8 - 5.6 % Final    Comment:    (NOTE)         Pre-diabetes: 5.7 - 6.4         Diabetes: >6.4         Glycemic control for adults with diabetes: <7.0   10/31/2014 11:45 PM 10.1 (H) <5.7 % Final    Comment:    (NOTE)                                                                       According to the ADA Clinical Practice Recommendations for 2011, when HbA1c is used as a screening test:  >=6.5%   Diagnostic of Diabetes Mellitus           (if abnormal result is confirmed) 5.7-6.4%   Increased risk of developing Diabetes Mellitus References:Diagnosis and Classification of Diabetes Mellitus,Diabetes SWFU,9323,55(DDUKG 1):S62-S69 and Standards of Medical Care in         Diabetes - 2011,Diabetes URKY,7062,37 (Suppl 1):S11-S61.     CBG:  Recent Labs Lab 06/30/16 0250 06/30/16 0824  GLUCAP 204* 170*    Recent Results (from the past 240 hour(s))  MRSA PCR Screening     Status: None   Collection Time: 06/30/16  2:35 AM  Result Value Ref Range Status   MRSA by PCR NEGATIVE NEGATIVE Final    Comment:        The GeneXpert MRSA Assay (FDA approved for NASAL specimens only), is one component of a comprehensive MRSA colonization surveillance program. It is not intended to diagnose MRSA infection nor to guide or monitor treatment for MRSA infections.      Scheduled Meds: . albuterol  2.5 mg Nebulization Q6H  . carvedilol  37.5 mg Oral BID WC  . chlorhexidine  15 mL Mouth Rinse BID  . diazepam  5 mg Oral QHS  . digoxin  0.125 mg Oral Daily  . furosemide  40 mg Intravenous BID  . gabapentin  1,200 mg Oral Q supper  . gabapentin  1,800 mg Oral QHS  . gabapentin  600 mg Oral Q lunch  . hydrALAZINE  25 mg Oral TID  . insulin aspart  0-20 Units Subcutaneous TID WC  .  insulin aspart  0-5 Units Subcutaneous QHS  . insulin detemir   30 Units Subcutaneous QHS  . isosorbide mononitrate  30 mg Oral Daily  . mouth rinse  15 mL Mouth Rinse q12n4p  . potassium chloride  40 mEq Oral BID  . predniSONE  40 mg Oral Q breakfast  . rivaroxaban  20 mg Oral Q supper  . rosuvastatin  20 mg Oral q1800  . sacubitril-valsartan  1 tablet Oral BID  . spironolactone  25 mg Oral Daily     LOS: 1 day   Cherene Altes, MD [facility] Office  [phone] Pager - Text Page per Amion as per below:  On-Call/Text Page:      [email or web address]      password [email or web address]  If 7PM-7AM, please contact night-coverage [email or web address] Password [email or web address] 06/30/2016, 9:04 AM

## 2016-07-01 ENCOUNTER — Inpatient Hospital Stay (HOSPITAL_COMMUNITY): Payer: Medicare Other

## 2016-07-01 DIAGNOSIS — E1165 Type 2 diabetes mellitus with hyperglycemia: Secondary | ICD-10-CM | POA: Diagnosis present

## 2016-07-01 DIAGNOSIS — E0842 Diabetes mellitus due to underlying condition with diabetic polyneuropathy: Secondary | ICD-10-CM | POA: Diagnosis present

## 2016-07-01 DIAGNOSIS — G8929 Other chronic pain: Secondary | ICD-10-CM

## 2016-07-01 DIAGNOSIS — J9602 Acute respiratory failure with hypercapnia: Secondary | ICD-10-CM

## 2016-07-01 DIAGNOSIS — IMO0002 Reserved for concepts with insufficient information to code with codable children: Secondary | ICD-10-CM | POA: Diagnosis present

## 2016-07-01 DIAGNOSIS — E118 Type 2 diabetes mellitus with unspecified complications: Secondary | ICD-10-CM

## 2016-07-01 DIAGNOSIS — I509 Heart failure, unspecified: Secondary | ICD-10-CM

## 2016-07-01 DIAGNOSIS — I10 Essential (primary) hypertension: Secondary | ICD-10-CM

## 2016-07-01 DIAGNOSIS — I481 Persistent atrial fibrillation: Secondary | ICD-10-CM

## 2016-07-01 DIAGNOSIS — J441 Chronic obstructive pulmonary disease with (acute) exacerbation: Secondary | ICD-10-CM

## 2016-07-01 DIAGNOSIS — J9601 Acute respiratory failure with hypoxia: Secondary | ICD-10-CM

## 2016-07-01 DIAGNOSIS — I4581 Long QT syndrome: Secondary | ICD-10-CM

## 2016-07-01 LAB — COMPREHENSIVE METABOLIC PANEL
ALT: 20 U/L (ref 17–63)
AST: 17 U/L (ref 15–41)
Albumin: 2.5 g/dL — ABNORMAL LOW (ref 3.5–5.0)
Alkaline Phosphatase: 90 U/L (ref 38–126)
Anion gap: 7 (ref 5–15)
BUN: 16 mg/dL (ref 6–20)
CHLORIDE: 99 mmol/L — AB (ref 101–111)
CO2: 33 mmol/L — ABNORMAL HIGH (ref 22–32)
Calcium: 8 mg/dL — ABNORMAL LOW (ref 8.9–10.3)
Creatinine, Ser: 0.79 mg/dL (ref 0.61–1.24)
Glucose, Bld: 167 mg/dL — ABNORMAL HIGH (ref 65–99)
POTASSIUM: 4.2 mmol/L (ref 3.5–5.1)
Sodium: 139 mmol/L (ref 135–145)
TOTAL PROTEIN: 5.7 g/dL — AB (ref 6.5–8.1)
Total Bilirubin: 0.2 mg/dL — ABNORMAL LOW (ref 0.3–1.2)

## 2016-07-01 LAB — GLUCOSE, CAPILLARY
GLUCOSE-CAPILLARY: 193 mg/dL — AB (ref 65–99)
GLUCOSE-CAPILLARY: 217 mg/dL — AB (ref 65–99)
GLUCOSE-CAPILLARY: 273 mg/dL — AB (ref 65–99)
Glucose-Capillary: 148 mg/dL — ABNORMAL HIGH (ref 65–99)
Glucose-Capillary: 280 mg/dL — ABNORMAL HIGH (ref 65–99)

## 2016-07-01 LAB — CBC
HEMATOCRIT: 37.4 % — AB (ref 39.0–52.0)
Hemoglobin: 11.5 g/dL — ABNORMAL LOW (ref 13.0–17.0)
MCH: 26.4 pg (ref 26.0–34.0)
MCHC: 30.7 g/dL (ref 30.0–36.0)
MCV: 86 fL (ref 78.0–100.0)
PLATELETS: 313 10*3/uL (ref 150–400)
RBC: 4.35 MIL/uL (ref 4.22–5.81)
RDW: 16.7 % — ABNORMAL HIGH (ref 11.5–15.5)
WBC: 9.4 10*3/uL (ref 4.0–10.5)

## 2016-07-01 LAB — ECHOCARDIOGRAM COMPLETE
HEIGHTINCHES: 72 in
Weight: 4603.2 oz

## 2016-07-01 LAB — HEMOGLOBIN A1C
Hgb A1c MFr Bld: 9.4 % — ABNORMAL HIGH (ref 4.8–5.6)
MEAN PLASMA GLUCOSE: 223 mg/dL

## 2016-07-01 LAB — PROCALCITONIN

## 2016-07-01 MED ORDER — ALBUTEROL SULFATE (2.5 MG/3ML) 0.083% IN NEBU
2.5000 mg | INHALATION_SOLUTION | Freq: Four times a day (QID) | RESPIRATORY_TRACT | Status: DC
  Administered 2016-07-02: 2.5 mg via RESPIRATORY_TRACT
  Filled 2016-07-01: qty 3

## 2016-07-01 MED ORDER — FUROSEMIDE 10 MG/ML IJ SOLN
120.0000 mg | Freq: Three times a day (TID) | INTRAMUSCULAR | Status: DC
  Administered 2016-07-01 – 2016-07-02 (×4): 120 mg via INTRAVENOUS
  Filled 2016-07-01 (×6): qty 12

## 2016-07-01 MED ORDER — PERFLUTREN LIPID MICROSPHERE
1.0000 mL | INTRAVENOUS | Status: AC | PRN
  Administered 2016-07-01: 2 mL via INTRAVENOUS
  Filled 2016-07-01: qty 10

## 2016-07-01 MED ORDER — WHITE PETROLATUM GEL
Status: AC
  Filled 2016-07-01: qty 1

## 2016-07-01 MED ORDER — INSULIN ASPART 100 UNIT/ML ~~LOC~~ SOLN
8.0000 [IU] | Freq: Three times a day (TID) | SUBCUTANEOUS | Status: DC
  Administered 2016-07-01 – 2016-07-02 (×3): 8 [IU] via SUBCUTANEOUS

## 2016-07-01 NOTE — Progress Notes (Signed)
PROGRESS NOTE    Nicholas Caldwell  BOF:[id number] DOB: 1945-12-14 DOA: 06/29/2016 PCP: [facility]   Brief Narrative:  70 y.o.BM PMHx non-ischemic cardiomyopathy, Chronic Systolic CHF EF 77-82%,  A-fibon Xarelto,Prolonged Q-T interval , HTN, HLD, COPD on 2 L O2 at home PRN,, Tobacco Abuse, DM Type 2 uncontrolled with complication , and Chronic Pain on Methadone  who presented with dyspnea.  The patient reported worsening shortness breath with exertion over a week as well as leg swelling.  His shortness of breath got acutely worse, and did not improve with home albuterol, so he called EMS.   In the ED he was hypoxic on room air, improved with supplemental O2 via BiPAP.   Subjective: 8/29 A/O 4, patient admits he is noncompliant with all his medications. Extremely concerned at this point secondary to wife being hospitalized down in [place] at [facility]. Per patient on 2 L O2 at home PRN.     Assessment & Plan:   Principal Problem:   Acute on chronic systolic CHF (congestive heart failure) (HCC) Active Problems:   Essential hypertension   Type 2 diabetes mellitus with diabetic neuropathy, with long-term current use of insulin (HCC)   Elevated troponin I level   COPD exacerbation (HCC)   Acute respiratory failure with hypoxia and hypercarbia (HCC)   Chronic pain   Persistent atrial fibrillation (HCC)   Normocytic anemia Acute on chronic hypoxic and hypercarbic respiratory failure -Most likely multifactorial to include acute on chronic systolic CHF +/- COPD flare - wean off BIPAP - cont to diurese  -Titrate O2 to maintain SPO2 89-93%  Acute on Chronic systolic CHF(states baseline weight is 260 (~117kg)) -Coreg 37.5 mg BID -Digoxin 0.25 mg daily -Lasix 120 mg TID -Hydralazine 25 mg TID -Imdur 30 mg daily -Spironolactone 25 mg daily -Strict I&O since admission -5.7 L -Daily weight Filed Weights   06/29/16 2200 06/30/16 0226 07/01/16 0500  Weight: 116.1 kg (256  lb) 127.1 kg (280 lb 3.3 oz) 130.5 kg (287 lb 11.2 oz)   Prolonged QTc -likely from methadone  - holding methadone  - follow on tele  - recheck EKG 8/30  Atrial fibrillation(CHADS2-VASc 5) -Rate controlled - on Xarelto  Pulmonary hypertension -See CHF   COPD with acute exacerbation -No wheezing, DC steroids  -Albuterol nebulizer QID  Elevated troponin -Suspect this is from CHF - follow to peak - no chest pain   DM Type 2 uncontrolled with complication (neuropathy) -8/28 Hemoglobin A1c= 9.4 -Levemir 30 units daily -Resistant SSI -NovoLog 8 units QAC  Chronic pain -I have confirmed in the [facility] Narcotic Registry that the pt is prescribed methadone 23m QID dosing [place] - given his prolonged QT interval  substitute oxycodone and at a much lower dose due to his lethargy  -Lower neurontin dose due to lethargy - follow  Normocytic anemia Chronic, stable   Questionable history of seizures Patient states he takes diazepam 10 mg "to prevent seizures" - Diazepam reduced dose given respiratory suppression     DVT prophylaxis: Xarelto Code Status: Full Family Communication: None Disposition Plan: Home after completing diuresis   Consultants:  None  Procedures/Significant Events:  8/29 Echocardiogram:Left ventricle: moderately dilated. moderate LVH. --LVEF=25% to 30%. - Mitral valve:mild to moderate regurgitation. - Left atrium: severely dilated. - Right atrium: severely dilated.- Tricuspid valve: mild-moderate regurgitation. - Pulmonary arteries: PA peak pressure: 54 mm Hg (S).   Cultures   Antimicrobials: Zosyn 8/27 >   Devices  LINES / TUBES:      Continuous Infusions:    Objective: Vitals:   07/01/16 1100 07/01/16 1118 07/01/16 1200 07/01/16 1300  BP: 130/77  131/84 120/79  Pulse: 88  93 89  Resp: 19  (!) 24 (!) 27  Temp:  98.7 F (37.1 C)    TempSrc:  Oral    SpO2: 97%  95% 96%  Weight:      Height:         Intake/Output Summary (Last 24 hours) at 07/01/16 1414 Last data filed at 07/01/16 1343  Gross per 24 hour  Intake              702 ml  Output             4400 ml  Net            -3698 ml   Filed Weights   06/29/16 2200 06/30/16 0226 07/01/16 0500  Weight: 116.1 kg (256 lb) 127.1 kg (280 lb 3.3 oz) 130.5 kg (287 lb 11.2 oz)    Examination:  General: A/O 4, NAD, positive acute on chronic respiratory distress Eyes: negative scleral hemorrhage, negative anisocoria, negative icterus ENT: Negative Runny nose, negative gingival bleeding, Neck:  Negative scars, masses, torticollis, lymphadenopathy, JVD Lungs: extremely difficult to auscultate secondary to body habitus, appeared Clear to auscultation bilaterally without wheezes or crackles Cardiovascular: Irregular irregular rhythm and rate, without murmur gallop or rub normal S1 and S2 Abdomen: Morbidly obese, negative abdominal pain, nondistended, positive soft, bowel sounds, no rebound, no ascites, no appreciable mass Extremities: anasarca Skin: Negative rashes, lesions, ulcers Psychiatric:  Negative depression, negative anxiety, negative fatigue, negative mania  Central nervous system:  Cranial nerves II through XII intact, tongue/uvula midline, all extremities muscle strength 5/5, sensation intact throughout, negative dysarthria, negative expressive aphasia, negative receptive aphasia.  .     Data Reviewed: Care during the described time interval was provided by me .  I have reviewed this patient's available data, including medical history, events of note, physical examination, and all test results as part of my evaluation. I have personally reviewed and interpreted all radiology studies.  CBC:  Recent Labs Lab 06/29/16 1946 06/30/16 0815 07/01/16 0439  WBC 7.7 6.7 9.4  NEUTROABS 6.2  --   --   HGB 11.9* 12.0* 11.5*  HCT 39.5 39.7 37.4*  MCV 87.6 87.3 86.0  PLT 341 328 562   Basic Metabolic Panel:  Recent Labs Lab  06/29/16 1946 06/30/16 0815 07/01/16 0439  NA 137 138 139  K 3.8 4.4 4.2  CL 103 100* 99*  CO2 27 31 33*  GLUCOSE 139* 179* 167*  BUN _0 CREATININE 0.69 0.80 0.79  CALCIUM 8.0* 8.2* 8.0*   GFR: Estimated Creatinine Clearance: 123.5 mL/min (by C-G formula based on SCr of 0.8 mg/dL). Liver Function Tests:  Recent Labs Lab 06/29/16 1946 07/01/16 0439  AST 22 17  ALT 23 20  ALKPHOS 107 90  BILITOT 0.6 0.2*  PROT 6.6 5.7*  ALBUMIN 3.0* 2.5*   No results for input(s): LIPASE, AMYLASE in the last 168 hours. No results for input(s): AMMONIA in the last 168 hours. Coagulation Profile: No results for input(s): INR, PROTIME in the last 168 hours. Cardiac Enzymes:  Recent Labs Lab 06/29/16 1946 06/30/16 0311 06/30/16 0815  TROPONINI 0.03* 0.04* 0.03*   BNP (last 3 results)  Recent Labs  08/02/15 1714  PROBNP 363.0*   HbA1C:  Recent Labs  06/30/16 0311  HGBA1C  9.4*   CBG:  Recent Labs Lab 06/30/16 1205 06/30/16 1629 06/30/16 2154 07/01/16 0816 07/01/16 1131  GLUCAP 202* 173* 249* 148* 193*   Lipid Profile: No results for input(s): CHOL, HDL, LDLCALC, TRIG, CHOLHDL, LDLDIRECT in the last 72 hours. Thyroid Function Tests: No results for input(s): TSH, T4TOTAL, FREET4, T3FREE, THYROIDAB in the last 72 hours. Anemia Panel: No results for input(s): VITAMINB12, FOLATE, FERRITIN, TIBC, IRON, RETICCTPCT in the last 72 hours. Urine analysis:    Component Value Date/Time   COLORURINE YELLOW 10/26/2014 2206   APPEARANCEUR CLEAR 10/26/2014 2206   LABSPEC 1.017 10/26/2014 2206   PHURINE 6.0 10/26/2014 2206   GLUCOSEU NEGATIVE 10/26/2014 2206   HGBUR NEGATIVE 10/26/2014 2206   BILIRUBINUR NEGATIVE 10/26/2014 2206   KETONESUR NEGATIVE 10/26/2014 2206   PROTEINUR NEGATIVE 10/26/2014 2206   UROBILINOGEN 0.2 10/26/2014 2206   NITRITE NEGATIVE 10/26/2014 2206   LEUKOCYTESUR NEGATIVE 10/26/2014 2206   Sepsis  Labs: _0 (procalcitonin:4,lacticidven:4)  ) Recent Results (from the past 240 hour(s))  MRSA PCR Screening     Status: None   Collection Time: 06/30/16  2:35 AM  Result Value Ref Range Status   MRSA by PCR NEGATIVE NEGATIVE Final    Comment:        The GeneXpert MRSA Assay (FDA approved for NASAL specimens only), is one component of a comprehensive MRSA colonization surveillance program. It is not intended to diagnose MRSA infection nor to guide or monitor treatment for MRSA infections.          Radiology Studies: Dg Chest Portable 1 View  Result Date: 06/29/2016 CLINICAL DATA:  70 y/o M; increasing shortness of breath with history of COPD and smoking. EXAM: PORTABLE CHEST 1 VIEW COMPARISON:  05/25/2016 chest radiograph. FINDINGS: Stable cardiomediastinal silhouette given projection and technique. Interval increase in right greater than left perihilar and basilar opacities and blunted costophrenic angles in comparison with prior radiographs. No acute osseous abnormality is evident. IMPRESSION: Increasing right greater than left and basilar opacities probably represents pulmonary edema, possibly pneumonia. Small bilateral pleural effusions. Electronically Signed   By: Kristine Garbe M.D.   On: 06/29/2016 20:35        Scheduled Meds: . albuterol  2.5 mg Nebulization Q6H  . carvedilol  37.5 mg Oral BID WC  . chlorhexidine  15 mL Mouth Rinse BID  . diazepam  5 mg Oral QHS  . digoxin  0.125 mg Oral Daily  . furosemide  120 mg Intravenous BID  . gabapentin  800 mg Oral Q supper  . gabapentin  1,200 mg Oral QHS  . gabapentin  600 mg Oral Q lunch  . hydrALAZINE  25 mg Oral TID  . insulin aspart  0-20 Units Subcutaneous TID WC  . insulin aspart  0-5 Units Subcutaneous QHS  . insulin detemir  30 Units Subcutaneous QHS  . isosorbide mononitrate  30 mg Oral Daily  . mouth rinse  15 mL Mouth Rinse q12n4p  . oxyCODONE  5 mg Oral BID  . potassium chloride  40  mEq Oral BID  . predniSONE  40 mg Oral Q breakfast  . rivaroxaban  20 mg Oral Q supper  . rosuvastatin  20 mg Oral q1800  . sacubitril-valsartan  1 tablet Oral BID  . spironolactone  25 mg Oral Daily   Continuous Infusions:    LOS: 2 days    Time spent: 40 minutes    Zeynab Klett, Geraldo Docker, MD [facility] Pager [phone]   If 7PM-7AM, please contact night-coverage  [email or web address] Password [email or web address] 07/01/2016, 2:14 PM

## 2016-07-01 NOTE — Progress Notes (Signed)
Inpatient Diabetes Program Recommendations  AACE/ADA: New Consensus Statement on Inpatient Glycemic Control (2015)  Target Ranges:  Prepandial:   less than 140 mg/dL      Peak postprandial:   less than 180 mg/dL (1-2 hours)      Critically ill patients:  140 - 180 mg/dL   Lab Results  Component Value Date   GLUCAP 193 (H) 07/01/2016   HGBA1C 9.4 (H) 06/30/2016    Review of Glycemic Control  Diabetes history: DM2 Outpatient Diabetes medications: Levemir 44 units QHS, metformin 1000 mg bid, glipizide 20 mg bid Current orders for Inpatient glycemic control: Levemir 30 units QHS, Novolog resistant tidwc and hs  HgbA1C indicates sub-optimal glycemic control PTA. Will need to f/u with PCP within week of d/c for adjustments in diabetes meds.  Inpatient Diabetes Program Recommendations:    Increase Levemir to 40 units QHS Add meal coverage insulin - Novolog 4 units tidwc if pt eats > 50% meal.  Will continue to follow while inpatient. Thank you. Lorenda Peck, RD, LDN, CDE Inpatient Diabetes Coordinator [phone]

## 2016-07-01 NOTE — Progress Notes (Signed)
  Echocardiogram 2D Echocardiogram has been performed.  Jennette Dubin 07/01/2016, 11:22 AM

## 2016-07-01 NOTE — Care Management Note (Signed)
Case Management Note  Patient Details  Name: Nicholas Caldwell MRN: [id number] Date of Birth: 1946/02/24  Subjective/Objective:     Pt admitted with SOB and leg swelling           Action/Plan:  PTA from home.  CM will continue to follow for discharge needs   Expected Discharge Date:                  Expected Discharge Plan:  Home/Self Care  In-House Referral:     Discharge planning Services  CM Consult  Post Acute Care Choice:    Choice offered to:     DME Arranged:    DME Agency:     HH Arranged:    HH Agency:     Status of Service:  In process, will continue to follow  If discussed at Long Length of Stay Meetings, dates discussed:    Additional Comments:  [name], RN 07/01/2016, 3:37 PM

## 2016-07-02 DIAGNOSIS — I5023 Acute on chronic systolic (congestive) heart failure: Secondary | ICD-10-CM

## 2016-07-02 LAB — BASIC METABOLIC PANEL
Anion gap: 11 (ref 5–15)
BUN: 22 mg/dL — AB (ref 6–20)
CHLORIDE: 95 mmol/L — AB (ref 101–111)
CO2: 32 mmol/L (ref 22–32)
CREATININE: 0.9 mg/dL (ref 0.61–1.24)
Calcium: 8.2 mg/dL — ABNORMAL LOW (ref 8.9–10.3)
GFR calc Af Amer: >60 mL/min (ref >60)
GFR calc non Af Amer: >60 mL/min (ref >60)
Glucose, Bld: 176 mg/dL — ABNORMAL HIGH (ref 65–99)
Potassium: 3.8 mmol/L (ref 3.5–5.1)
SODIUM: 138 mmol/L (ref 135–145)

## 2016-07-02 LAB — GLUCOSE, CAPILLARY
GLUCOSE-CAPILLARY: 155 mg/dL — AB (ref 65–99)
GLUCOSE-CAPILLARY: 94 mg/dL (ref 65–99)
Glucose-Capillary: 105 mg/dL — ABNORMAL HIGH (ref 65–99)
Glucose-Capillary: 58 mg/dL — ABNORMAL LOW (ref 65–99)
Glucose-Capillary: 222 mg/dL — ABNORMAL HIGH (ref 65–99)

## 2016-07-02 MED ORDER — SENNA 8.6 MG PO TABS
1.0000 | ORAL_TABLET | Freq: Every day | ORAL | Status: DC
  Administered 2016-07-02 – 2016-07-03 (×2): 8.6 mg via ORAL
  Filled 2016-07-02 (×3): qty 1

## 2016-07-02 MED ORDER — DEXTROSE 50 % IV SOLN
INTRAVENOUS | Status: AC
  Administered 2016-07-02: 25 mL
  Filled 2016-07-02: qty 50

## 2016-07-02 MED ORDER — POLYETHYLENE GLYCOL 3350 17 G PO PACK
17.0000 g | PACK | Freq: Every day | ORAL | Status: DC
  Administered 2016-07-02: 17 g via ORAL
  Filled 2016-07-02 (×3): qty 1

## 2016-07-02 MED ORDER — ALBUTEROL SULFATE (2.5 MG/3ML) 0.083% IN NEBU
2.5000 mg | INHALATION_SOLUTION | Freq: Three times a day (TID) | RESPIRATORY_TRACT | Status: DC
  Administered 2016-07-02 – 2016-07-04 (×6): 2.5 mg via RESPIRATORY_TRACT
  Filled 2016-07-02 (×6): qty 3

## 2016-07-02 MED ORDER — METHADONE HCL 10 MG PO TABS: 10.0000 mg | ORAL_TABLET | Freq: Four times a day (QID) | ORAL | Status: DC

## 2016-07-02 MED ORDER — METHADONE HCL 10 MG PO TABS
10.0000 mg | ORAL_TABLET | Freq: Three times a day (TID) | ORAL | Status: DC
  Administered 2016-07-02 – 2016-07-04 (×6): 10 mg via ORAL
  Filled 2016-07-02 (×6): qty 1

## 2016-07-02 NOTE — Progress Notes (Signed)
PROGRESS NOTE    Nicholas Caldwell  HYI:[id number] DOB: Dec 01, 1945 DOA: 06/29/2016 PCP: [facility]   Brief Narrative:  70 y.o.BM PMHx non-ischemic cardiomyopathy, Chronic Systolic CHF EF 78-67%,  A-fibon Xarelto,Prolonged Q-T interval , HTN, HLD, COPD on 2 L O2 at home PRN,, Tobacco Abuse, DM Type 2 uncontrolled with complication , and Chronic Pain on Methadone  who presented with dyspnea.  The patient reported worsening shortness breath with exertion over a week as well as leg swelling.  His shortness of breath got acutely worse, and did not improve with home albuterol, so he called EMS.   In the ED he was hypoxic on room air, improved with supplemental O2 via BiPAP.   Subjective: 8/29 A/O 4, patient admits he is noncompliant with all his medications. Extremely concerned at this point secondary to wife being hospitalized down in [place] at [place]. Per patient on 2 L O2 at home PRN.     Assessment & Plan:   Principal Problem:   Acute on chronic systolic CHF (congestive heart failure) (HCC) Active Problems:   Essential hypertension   Type 2 diabetes mellitus with diabetic neuropathy, with long-term current use of insulin (HCC)   Elevated troponin I level   COPD exacerbation (HCC)   Acute respiratory failure with hypoxia and hypercarbia (HCC)   Chronic pain   Persistent atrial fibrillation (HCC)   Normocytic anemia   Uncontrolled type 2 diabetes mellitus with complication (HCC)   Diabetic polyneuropathy associated with diabetes mellitus due to underlying condition (HCC)  Acute on chronic hypoxic and hypercarbic respiratory failure -Most likely multifactorial to include acute on chronic systolic CHF +/- COPD flare - wean off BIPAP - cont to diurese  -Titrate O2 to maintain SPO2 89-93% -improved.   Acute on Chronic systolic CHF(states baseline weight is 260 (~117kg)) -Coreg 37.5 mg BID -Digoxin 0.25 mg daily -Lasix 120 mg TID IV, will continue with current  dose.  -Hydralazine 25 mg TID -Imdur 30 mg daily -Spironolactone 25 mg daily -Strict I&O since admission -7.7 L -Daily weight Filed Weights   06/30/16 0226 07/01/16 0500 07/02/16 0553  Weight: 127.1 kg (280 lb 3.3 oz) 130.5 kg (287 lb 11.2 oz) 125.3 kg (276 lb 3.2 oz)   Encephalopathy; secondary to hypercapnia, polypharmacy.  Improved. Adjusting medications.   Prolonged QTc -likely from methadone  -reduce methadone dose, monitor on telemetry.  -  EKG 8/30 QT 449  Atrial fibrillation(CHADS2-VASc 5) -Rate controlled - on Xarelto  Pulmonary hypertension -See CHF   COPD with acute exacerbation -No wheezing, DC steroids  -Albuterol nebulizer QID  Elevated troponin -Suspect this is from CHF - follow to peak - no chest pain   DM Type 2 uncontrolled with complication (neuropathy) -8/28 Hemoglobin A1c= 9.4 -Levemir 30 units daily -Resistant SSI -NovoLog 8 units QAC  Chronic pain Dr wood have confirmed in the [place] that the pt is prescribed methadone 51m QID dosing [place] -QT not prolong. Will resume methadone lower dose.  -Lower neurontin dose due to lethargy - follow  Normocytic anemia Chronic, stable   Questionable history of seizures Patient states he takes diazepam 10 mg "to prevent seizures" - Diazepam reduced dose given respiratory suppression     DVT prophylaxis: Xarelto Code Status: Full Family Communication: None Disposition Plan: Home after completing diuresis   Consultants:  None  Procedures/Significant Events:  8/29 Echocardiogram:Left ventricle: moderately dilated. moderate LVH. --LVEF=25% to 30%. - Mitral valve:mild to moderate regurgitation. - Left atrium:  severely dilated. - Right atrium: severely dilated.- Tricuspid valve: mild-moderate regurgitation. - Pulmonary arteries: PA peak pressure: 54 mm Hg (S).   Cultures   Antimicrobials: Zosyn 8/27 >   Devices     LINES / TUBES:       Continuous Infusions:    Objective: Vitals:   07/01/16 1942 07/01/16 2040 07/01/16 2319 07/02/16 0553  BP: (!) 102/51   130/72  Pulse: (!) 106  (!) 106 88  Resp: (!) _0 Temp: 98.4 F (36.9 C)   98.5 F (36.9 C)  TempSrc: Oral   Oral  SpO2: 100% 95% 94% 94%  Weight:    125.3 kg (276 lb 3.2 oz)  Height:        Intake/Output Summary (Last 24 hours) at 07/02/16 0836 Last data filed at 07/02/16 0554  Gross per 24 hour  Intake              906 ml  Output             3925 ml  Net            -3019 ml   Filed Weights   06/30/16 0226 07/01/16 0500 07/02/16 0553  Weight: 127.1 kg (280 lb 3.3 oz) 130.5 kg (287 lb 11.2 oz) 125.3 kg (276 lb 3.2 oz)    Examination:  General: A/O 4, NAD, positive acute on chronic respiratory distress Eyes: negative scleral hemorrhage, negative anisocoria, negative icterus ENT: Negative Runny nose, negative gingival bleeding, Neck:  Negative scars, masses, torticollis, lymphadenopathy, JVD Lungs: extremely difficult to auscultate secondary to body habitus, appeared Clear to auscultation bilaterally without wheezes or crackles Cardiovascular: Irregular irregular rhythm and rate, without murmur gallop or rub normal S1 and S2 Abdomen: Morbidly obese, negative abdominal pain, nondistended, positive soft, bowel sounds, no rebound, no ascites, no appreciable mass Extremities: plus 2 edema  Skin: Negative rashes, lesions, ulcers Psychiatric:  Negative depression, negative anxiety, negative fatigue, negative mania  Central nervous system:  Alert times 3, non focal.   .   CBC:  Recent Labs Lab 06/29/16 1946 06/30/16 0815 07/01/16 0439  WBC 7.7 6.7 9.4  NEUTROABS 6.2  --   --   HGB 11.9* 12.0* 11.5*  HCT 39.5 39.7 37.4*  MCV 87.6 87.3 86.0  PLT 341 328 737   Basic Metabolic Panel:  Recent Labs Lab 06/29/16 1946 06/30/16 0815 07/01/16 0439  NA 137 138 139  K 3.8 4.4 4.2  CL 103 100* 99*  CO2 27 31 33*  GLUCOSE 139* 179*  167*  BUN _1 CREATININE 0.69 0.80 0.79  CALCIUM 8.0* 8.2* 8.0*   GFR: Estimated Creatinine Clearance: 120.9 mL/min (by C-G formula based on SCr of 0.8 mg/dL). Liver Function Tests:  Recent Labs Lab 06/29/16 1946 07/01/16 0439  AST 22 17  ALT 23 20  ALKPHOS 107 90  BILITOT 0.6 0.2*  PROT 6.6 5.7*  ALBUMIN 3.0* 2.5*   No results for input(s): LIPASE, AMYLASE in the last 168 hours. No results for input(s): AMMONIA in the last 168 hours. Coagulation Profile: No results for input(s): INR, PROTIME in the last 168 hours. Cardiac Enzymes:  Recent Labs Lab 06/29/16 1946 06/30/16 0311 06/30/16 0815  TROPONINI 0.03* 0.04* 0.03*   BNP (last 3 results)  Recent Labs  08/02/15 1714  PROBNP 363.0*   HbA1C:  Recent Labs  06/30/16 0311  HGBA1C 9.4*   CBG:  Recent Labs Lab 07/01/16 1131 07/01/16 1516 07/01/16 1730 07/01/16 2040  07/02/16 0724  GLUCAP 193* 280* 217* 273* 155*   Lipid Profile: No results for input(s): CHOL, HDL, LDLCALC, TRIG, CHOLHDL, LDLDIRECT in the last 72 hours. Thyroid Function Tests: No results for input(s): TSH, T4TOTAL, FREET4, T3FREE, THYROIDAB in the last 72 hours. Anemia Panel: No results for input(s): VITAMINB12, FOLATE, FERRITIN, TIBC, IRON, RETICCTPCT in the last 72 hours. Urine analysis:    Component Value Date/Time   COLORURINE YELLOW 10/26/2014 2206   APPEARANCEUR CLEAR 10/26/2014 2206   LABSPEC 1.017 10/26/2014 2206   PHURINE 6.0 10/26/2014 2206   GLUCOSEU NEGATIVE 10/26/2014 2206   HGBUR NEGATIVE 10/26/2014 2206   BILIRUBINUR NEGATIVE 10/26/2014 2206   KETONESUR NEGATIVE 10/26/2014 2206   PROTEINUR NEGATIVE 10/26/2014 2206   UROBILINOGEN 0.2 10/26/2014 2206   NITRITE NEGATIVE 10/26/2014 2206   LEUKOCYTESUR NEGATIVE 10/26/2014 2206   Sepsis Labs: _0 (procalcitonin:4,lacticidven:4)  ) Recent Results (from the past 240 hour(s))  MRSA PCR Screening     Status: None   Collection Time: 06/30/16  2:35 AM   Result Value Ref Range Status   MRSA by PCR NEGATIVE NEGATIVE Final    Comment:        The GeneXpert MRSA Assay (FDA approved for NASAL specimens only), is one component of a comprehensive MRSA colonization surveillance program. It is not intended to diagnose MRSA infection nor to guide or monitor treatment for MRSA infections.          Radiology Studies: No results found.      Scheduled Meds: . albuterol  2.5 mg Nebulization QID  . carvedilol  37.5 mg Oral BID WC  . chlorhexidine  15 mL Mouth Rinse BID  . diazepam  5 mg Oral QHS  . digoxin  0.125 mg Oral Daily  . furosemide  120 mg Intravenous TID  . gabapentin  800 mg Oral Q supper  . gabapentin  1,200 mg Oral QHS  . gabapentin  600 mg Oral Q lunch  . hydrALAZINE  25 mg Oral TID  . insulin aspart  0-20 Units Subcutaneous TID WC  . insulin aspart  0-5 Units Subcutaneous QHS  . insulin aspart  8 Units Subcutaneous TID WC  . insulin detemir  30 Units Subcutaneous QHS  . isosorbide mononitrate  30 mg Oral Daily  . mouth rinse  15 mL Mouth Rinse q12n4p  . oxyCODONE  5 mg Oral BID  . potassium chloride  40 mEq Oral BID  . rivaroxaban  20 mg Oral Q supper  . rosuvastatin  20 mg Oral q1800  . sacubitril-valsartan  1 tablet Oral BID  . spironolactone  25 mg Oral Daily   Continuous Infusions:    LOS: 3 days    Time spent: 35 minutes    Elmarie Shiley, MD [facility] Pager [phone]  If 7PM-7AM, please contact night-coverage [email or web address] Password [facility] 07/02/2016, 8:36 AM

## 2016-07-02 NOTE — Care Management Important Message (Signed)
Important Message  Patient Details  Name: Nicholas Caldwell MRN: [id number] Date of Birth: 10/09/1946   Medicare Important Message Given:  Yes    [name] Abena 07/02/2016, 1:24 PM

## 2016-07-02 NOTE — Consult Note (Signed)
   [facility] CM Inpatient Consult   07/02/2016  Shalon Councilman [birth year] [phone]   Patient screened for [place] [facility] services. Went to bedside to verify Primary Care Provider. However, he was "washing up". Will come back at later time.    Marthenia Rolling, MSN-Ed, RN,BSN [facility] Liaison [phone]

## 2016-07-02 NOTE — Progress Notes (Signed)
PT Cancellation Note  Patient Details Name: Nickalus Thornsberry MRN: [id number] DOB: Feb 01, 1946   Cancelled Treatment:    Reason Eval/Treat Not Completed: Patient declined (with much encouragement, declined stating that he has had no sleep. will check back at another time.)   Claretha Cooper 07/02/2016, 10:59 AM Tresa Endo PT [phone]

## 2016-07-02 NOTE — Progress Notes (Signed)
Nutrition Education Note  RD consulted for nutrition education regarding CHF.  RD provided "Low Sodium Nutrition Therapy" handout from the Academy of [employer] and Dietetics. Patient requested to read over the material later, he did not want to discuss diet with RD at this time.  Expect poor compliance.  Body mass index is 37.46 kg/m. Pt meets criteria for class 2 obesity based on current BMI.  Current diet order is heart healthy CHO modified, patient is consuming approximately 100% of meals at this time. Labs and medications reviewed. No further nutrition interventions warranted at this time. RD contact information provided. If additional nutrition issues arise, please re-consult RD.  Molli Barrows, RD, LDN, [place] Pager [phone] After Hours Pager [phone]

## 2016-07-03 LAB — BASIC METABOLIC PANEL
ANION GAP: 8 (ref 5–15)
BUN: 22 mg/dL — ABNORMAL HIGH (ref 6–20)
CHLORIDE: 98 mmol/L — AB (ref 101–111)
CO2: 37 mmol/L — AB (ref 22–32)
CREATININE: 0.86 mg/dL (ref 0.61–1.24)
Calcium: 8.3 mg/dL — ABNORMAL LOW (ref 8.9–10.3)
GFR calc non Af Amer: >60 mL/min (ref >60)
Glucose, Bld: 94 mg/dL (ref 65–99)
Potassium: 3.9 mmol/L (ref 3.5–5.1)
Sodium: 143 mmol/L (ref 135–145)

## 2016-07-03 LAB — CBC
HEMATOCRIT: 41.1 % (ref 39.0–52.0)
HEMOGLOBIN: 12.4 g/dL — AB (ref 13.0–17.0)
MCH: 26.2 pg (ref 26.0–34.0)
MCHC: 30.2 g/dL (ref 30.0–36.0)
MCV: 86.7 fL (ref 78.0–100.0)
Platelets: 370 10*3/uL (ref 150–400)
RBC: 4.74 MIL/uL (ref 4.22–5.81)
RDW: 16.9 % — ABNORMAL HIGH (ref 11.5–15.5)
WBC: 8.9 10*3/uL (ref 4.0–10.5)

## 2016-07-03 LAB — GLUCOSE, CAPILLARY
GLUCOSE-CAPILLARY: 210 mg/dL — AB (ref 65–99)
Glucose-Capillary: 108 mg/dL — ABNORMAL HIGH (ref 65–99)
Glucose-Capillary: 181 mg/dL — ABNORMAL HIGH (ref 65–99)
Glucose-Capillary: 314 mg/dL — ABNORMAL HIGH (ref 65–99)
Glucose-Capillary: 268 mg/dL — ABNORMAL HIGH (ref 65–99)

## 2016-07-03 LAB — PROCALCITONIN: PROCALCITONIN: 0.11 ng/mL

## 2016-07-03 MED ORDER — MORPHINE SULFATE (PF) 2 MG/ML IV SOLN
0.5000 mg | Freq: Four times a day (QID) | INTRAVENOUS | Status: DC | PRN
  Administered 2016-07-03: 1 mg via INTRAVENOUS
  Filled 2016-07-03 (×2): qty 1

## 2016-07-03 MED ORDER — FUROSEMIDE 10 MG/ML IJ SOLN
120.0000 mg | Freq: Two times a day (BID) | INTRAVENOUS | Status: DC
  Administered 2016-07-03 – 2016-07-04 (×3): 120 mg via INTRAVENOUS
  Filled 2016-07-03 (×4): qty 12

## 2016-07-03 MED ORDER — INSULIN DETEMIR 100 UNIT/ML ~~LOC~~ SOLN
24.0000 [IU] | Freq: Every day | SUBCUTANEOUS | Status: DC
  Administered 2016-07-03: 24 [IU] via SUBCUTANEOUS
  Filled 2016-07-03 (×2): qty 0.24

## 2016-07-03 NOTE — Progress Notes (Signed)
PT Cancellation Note  Patient Details Name: Nicholas Caldwell MRN: [id number] DOB: 04-10-1946   Cancelled Treatment:    Reason Eval/Treat Not Completed: Patient declined, no reason specified. Attempted PT eval and pt refused despite education on importance of mobility during hospital stay.  Nurse also encouraged pt to participate and he continued to refuse.  Will attempt tomorrow one more time and if he refuses will d/c order. Thank you.   Klohe Lovering LUBECK 07/03/2016, 10:28 AM

## 2016-07-03 NOTE — Progress Notes (Signed)
OT Cancellation Note  Patient Details Name: Nicholas Caldwell MRN: [id number] DOB: 1946/10/03   Cancelled Treatment:    Reason Eval/Treat Not Completed: Patient declined, no reason specified. Pt refused to participate in therapy x2 today and asked therapist not to return as he says that he "gets around fine here and at home, so I don't need any of you." Will sign off at this time as pt refusing to participate with therapy. Please re-consult if needs change.  Redmond Baseman, OTR/L Pager: [phone] 07/03/2016, 3:21 PM

## 2016-07-03 NOTE — Care Management Note (Addendum)
Case Management Note  Patient Details  Name: Nicholas Caldwell MRN: [id number] Date of Birth: Apr 21, 1946  Subjective/Objective: Pt presented for Dyspnea and leg edema. Pt continues on IV lasix bid q 12 hrs. Pt is from home with wife. Per pt he has DME RW & 02 and he still drives along with the wife. Pt has PCP at the [facility]. Per pt he gets his medications fine.                     Action/Plan: CM did try to address home care needs. Per pt he will not need any services once stable for d/c home. CM did state that [facility] may be beneficial, however refused. Pt has declined PT for 2 days. CM will continue to monitor.   Expected Discharge Date:                  Expected Discharge Plan:  Home/Self Care  In-House Referral:  NA  Discharge planning Services  CM Consult  Post Acute Care Choice: Home with [place] Choice offered to:  Patient DME Arranged:  N/A DME Agency:  NA  HH Arranged: Registered Nurse [place] Agency:  Alvis Lemmings Status of Service:  Completed, signed off  If discussed at [place] of Stay Meetings, dates discussed:    Additional Comments: 1038 07-04-16 [name], RN,BSN [phone] Pt unwilling to speak with CM in regards to dc planning on 07-03-16. MD stated pt needs [facility] RN Services. Pt is agreeable to services with [facility]. CM did call [name] to make referral for Services listed. SOC to begin within 24-48 hours post d/c. Pt has PCP with the [facility], However CM and Edwina discussed that Cardiology can write order for patient since he is followed by them as well. CM did call [facility] to see if 02 is with the agency. Awaiting call back. Pt unable to state who 02 is supplied by. Per pt the wife is in the hospital and can not answer questions. Staff RN to ambulate pt to see if 02 tank needed for transport. Per pt he has no transportation home. CM did offer ambulance transport home with the understanding that he may receive a bill. CM did ask if pt has  money to pay for a cab and if he was able to get inside the home once arrived. CM did call CSW Denyse Amass to see if she can assist with transportation. Pt not willing to tell CM if he was able to get medications. Stated CM was asking to many questions and getting into his business. No further needs from CM at this time.   [facility] did call and pt has 02 via them. Pt will not need 02 for travel home. Staff RN checked 02 and it was 92%. CM did call Heart Failure Navigator and she will speak with pt in regards to Heart Failure. No further needs. 1116 07-04-16   [name], RN 07/03/2016, 4:14 PM

## 2016-07-03 NOTE — Progress Notes (Signed)
PROGRESS NOTE    Mylon Mabey  DJT:[id number] DOB: Jul 01, 1946 DOA: 06/29/2016 PCP: [facility]   Brief Narrative:  70 y.o.BM PMHx non-ischemic cardiomyopathy, Chronic Systolic CHF EF 30-09%,  A-fibon Xarelto,Prolonged Q-T interval , HTN, HLD, COPD on 2 L O2 at home PRN,, Tobacco Abuse, DM Type 2 uncontrolled with complication , and Chronic Pain on Methadone  who presented with dyspnea.  The patient reported worsening shortness breath with exertion over a week as well as leg swelling.  His shortness of breath got acutely worse, and did not improve with home albuterol, so he called EMS.   In the ED he was hypoxic on room air, improved with supplemental O2 via BiPAP.   Subjective: 8/29 A/O 4, patient admits he is noncompliant with all his medications. Extremely concerned at this point secondary to wife being hospitalized down in [place] at [place]. Per patient on 2 L O2 at home PRN.     Assessment & Plan:   Principal Problem:   Acute on chronic systolic CHF (congestive heart failure) (HCC) Active Problems:   Essential hypertension   Type 2 diabetes mellitus with diabetic neuropathy, with long-term current use of insulin (HCC)   Elevated troponin I level   COPD exacerbation (HCC)   Acute respiratory failure with hypoxia and hypercarbia (HCC)   Chronic pain   Persistent atrial fibrillation (HCC)   Normocytic anemia   Uncontrolled type 2 diabetes mellitus with complication (HCC)   Diabetic polyneuropathy associated with diabetes mellitus due to underlying condition (HCC)  Acute on chronic hypoxic and hypercarbic respiratory failure -Most likely multifactorial to include acute on chronic systolic CHF +/- COPD flare - wean off BIPAP - cont to diurese  -Titrate O2 to maintain SPO2 89-93% -improved.   Acute on Chronic systolic CHF(states baseline weight is 260 (~117kg)) -Coreg 37.5 mg BID -Digoxin 0.25 mg daily -Lasix 120 mg  Change to BID due to increase Co2.    -Hydralazine 25 mg TID -Imdur 30 mg daily -Spironolactone 25 mg daily -Strict I&O since admission -9.1 L -Daily weight Filed Weights   07/01/16 0500 07/02/16 0553 07/03/16 0533  Weight: 130.5 kg (287 lb 11.2 oz) 125.3 kg (276 lb 3.2 oz) 123.7 kg (272 lb 9.6 oz)   Plan to change lasix to oral tomorrow and patient wants to be discharge tomorrow.   Encephalopathy; secondary to hypercapnia, polypharmacy.  Improved. Adjusting medications.   Prolonged QTc -likely from methadone electrolytes abnormalities.  -reduce methadone dose, monitor on telemetry.  -  EKG 8/30 QT 449-- repeat in am.   Atrial fibrillation(CHADS2-VASc 5) -Rate controlled - on Xarelto  Pulmonary hypertension -See CHF   COPD with acute exacerbation -No wheezing, DC steroids  -Albuterol nebulizer QID  Elevated troponin -Suspect this is from CHF - follow to peak - no chest pain   DM Type 2 uncontrolled with complication (neuropathy) -8/28 Hemoglobin A1c= 9.4 -Levemir decreased to 24 units due to hypoglycemia.  -Resistant SSI -hold [place] coverage. Had hypoglycemia.   Chronic pain Dr Clydene Laming have confirmed in the [place] that the pt is prescribed methadone 60m QID dosing [place] -QT not prolong. resume methadone TID. Monitor QT interval.  -Lower neurontin dose due to lethargy - follow -IV morphine PRN ordered, patient is complaining of pain.   Normocytic anemia Chronic, stable   Questionable history of seizures Patient states he takes diazepam 10 mg "to prevent seizures" - Diazepam reduced dose given respiratory suppression     DVT  prophylaxis: Xarelto Code Status: Full Family Communication: None Disposition Plan: Home after completing diuresis   Consultants:  None  Procedures/Significant Events:  8/29 Echocardiogram:Left ventricle: moderately dilated. moderate LVH. --LVEF=25% to 30%. - Mitral valve:mild to moderate regurgitation. - Left atrium: severely  dilated. - Right atrium: severely dilated.- Tricuspid valve: mild-moderate regurgitation. - Pulmonary arteries: PA peak pressure: 54 mm Hg (S).   Antimicrobials: Zosyn 8/27 >   Continuous Infusions:    Objective: Vitals:   07/02/16 2119 07/02/16 2135 07/03/16 0533 07/03/16 0835  BP: 118/78  (!) 133/91   Pulse:  88 88   Resp:  13 19   Temp:  98.1 F (36.7 C) 97.3 F (36.3 C)   TempSrc:  Oral Axillary   SpO2:  100% 98% 93%  Weight:   123.7 kg (272 lb 9.6 oz)   Height:        Intake/Output Summary (Last 24 hours) at 07/03/16 1013 Last data filed at 07/03/16 0442  Gross per 24 hour  Intake              650 ml  Output             2345 ml  Net            -1695 ml   Filed Weights   07/01/16 0500 07/02/16 0553 07/03/16 0533  Weight: 130.5 kg (287 lb 11.2 oz) 125.3 kg (276 lb 3.2 oz) 123.7 kg (272 lb 9.6 oz)    Examination:  General: A/O 4, NAD, positive acute on chronic respiratory distress Eyes: negative scleral hemorrhage, negative anisocoria, negative icterus ENT: Negative Runny nose, negative gingival bleeding, Neck:  Negative scars, masses, torticollis, lymphadenopathy, JVD Lungs: extremely difficult to auscultate secondary to body habitus, appeared Clear to auscultation bilaterally without wheezes or crackles Cardiovascular: Irregular irregular rhythm and rate, without murmur gallop or rub normal S1 and S2 Abdomen: Morbidly obese, negative abdominal pain, nondistended, positive soft, bowel sounds, no rebound, no ascites, no appreciable mass Extremities: plus 2 edema  Skin: Negative rashes, lesions, ulcers Psychiatric:  Negative depression, negative anxiety, negative fatigue, negative mania  Central nervous system:  Alert times 3, non focal.   .   CBC:  Recent Labs Lab 06/29/16 1946 06/30/16 0815 07/01/16 0439 07/03/16 0424  WBC 7.7 6.7 9.4 8.9  NEUTROABS 6.2  --   --   --   HGB 11.9* 12.0* 11.5* 12.4*  HCT 39.5 39.7 37.4* 41.1  MCV 87.6 87.3 86.0 86.7   PLT 341 328 313 599   Basic Metabolic Panel:  Recent Labs Lab 06/29/16 1946 06/30/16 0815 07/01/16 0439 07/02/16 0955 07/03/16 0424  NA 137 138 139 138 143  K 3.8 4.4 4.2 3.8 3.9  CL 103 100* 99* 95* 98*  CO2 27 31 33* 32 37*  GLUCOSE 139* 179* 167* 176* 94  BUN _0 22* 22*  CREATININE 0.69 0.80 0.79 0.90 0.86  CALCIUM 8.0* 8.2* 8.0* 8.2* 8.3*   GFR: Estimated Creatinine Clearance: 111.6 mL/min (by C-G formula based on SCr of 0.86 mg/dL). Liver Function Tests:  Recent Labs Lab 06/29/16 1946 07/01/16 0439  AST 22 17  ALT 23 20  ALKPHOS 107 90  BILITOT 0.6 0.2*  PROT 6.6 5.7*  ALBUMIN 3.0* 2.5*   No results for input(s): LIPASE, AMYLASE in the last 168 hours. No results for input(s): AMMONIA in the last 168 hours. Coagulation Profile: No results for input(s): INR, PROTIME in the last 168 hours. Cardiac Enzymes:  Recent Labs Lab  06/29/16 1946 06/30/16 0311 06/30/16 0815  TROPONINI 0.03* 0.04* 0.03*   BNP (last 3 results)  Recent Labs  08/02/15 1714  PROBNP 363.0*   HbA1C: No results for input(s): HGBA1C in the last 72 hours. CBG:  Recent Labs Lab 07/02/16 1124 07/02/16 1656 07/02/16 1728 07/02/16 2134 07/03/16 0738  GLUCAP 94 58* 105* 222* 108*   Lipid Profile: No results for input(s): CHOL, HDL, LDLCALC, TRIG, CHOLHDL, LDLDIRECT in the last 72 hours. Thyroid Function Tests: No results for input(s): TSH, T4TOTAL, FREET4, T3FREE, THYROIDAB in the last 72 hours. Anemia Panel: No results for input(s): VITAMINB12, FOLATE, FERRITIN, TIBC, IRON, RETICCTPCT in the last 72 hours. Urine analysis:    Component Value Date/Time   COLORURINE YELLOW 10/26/2014 2206   APPEARANCEUR CLEAR 10/26/2014 2206   LABSPEC 1.017 10/26/2014 2206   PHURINE 6.0 10/26/2014 2206   GLUCOSEU NEGATIVE 10/26/2014 2206   HGBUR NEGATIVE 10/26/2014 2206   BILIRUBINUR NEGATIVE 10/26/2014 2206   KETONESUR NEGATIVE 10/26/2014 2206   PROTEINUR NEGATIVE 10/26/2014 2206    UROBILINOGEN 0.2 10/26/2014 2206   NITRITE NEGATIVE 10/26/2014 2206   LEUKOCYTESUR NEGATIVE 10/26/2014 2206   Sepsis Labs: _0 (procalcitonin:4,lacticidven:4)  ) Recent Results (from the past 240 hour(s))  MRSA PCR Screening     Status: None   Collection Time: 06/30/16  2:35 AM  Result Value Ref Range Status   MRSA by PCR NEGATIVE NEGATIVE Final    Comment:        The GeneXpert MRSA Assay (FDA approved for NASAL specimens only), is one component of a comprehensive MRSA colonization surveillance program. It is not intended to diagnose MRSA infection nor to guide or monitor treatment for MRSA infections.          Radiology Studies: No results found.      Scheduled Meds: . albuterol  2.5 mg Nebulization TID  . carvedilol  37.5 mg Oral BID WC  . chlorhexidine  15 mL Mouth Rinse BID  . diazepam  5 mg Oral QHS  . digoxin  0.125 mg Oral Daily  . furosemide  120 mg Intravenous Q12H  . gabapentin  1,200 mg Oral QHS  . gabapentin  600 mg Oral Q lunch  . hydrALAZINE  25 mg Oral TID  . insulin aspart  0-20 Units Subcutaneous TID WC  . insulin detemir  24 Units Subcutaneous QHS  . isosorbide mononitrate  30 mg Oral Daily  . mouth rinse  15 mL Mouth Rinse q12n4p  . methadone  10 mg Oral Q8H  . polyethylene glycol  17 g Oral Daily  . potassium chloride  40 mEq Oral BID  . rivaroxaban  20 mg Oral Q supper  . rosuvastatin  20 mg Oral q1800  . sacubitril-valsartan  1 tablet Oral BID  . senna  1 tablet Oral Daily  . spironolactone  25 mg Oral Daily   Continuous Infusions:    LOS: 4 days    Time spent: 35 minutes    Elmarie Shiley, MD [facility] Pager [phone]  If 7PM-7AM, please contact night-coverage [email or web address] Password [email or web address] 07/03/2016, 10:13 AM

## 2016-07-04 LAB — BASIC METABOLIC PANEL
ANION GAP: 11 (ref 5–15)
BUN: 20 mg/dL (ref 6–20)
CALCIUM: 8.4 mg/dL — AB (ref 8.9–10.3)
CO2: 35 mmol/L — AB (ref 22–32)
Chloride: 94 mmol/L — ABNORMAL LOW (ref 101–111)
Creatinine, Ser: 0.87 mg/dL (ref 0.61–1.24)
GFR calc Af Amer: >60 mL/min (ref >60)
GLUCOSE: 178 mg/dL — AB (ref 65–99)
Potassium: 4.1 mmol/L (ref 3.5–5.1)
Sodium: 140 mmol/L (ref 135–145)

## 2016-07-04 LAB — MAGNESIUM: MAGNESIUM: 2 mg/dL (ref 1.7–2.4)

## 2016-07-04 LAB — GLUCOSE, CAPILLARY: GLUCOSE-CAPILLARY: 158 mg/dL — AB (ref 65–99)

## 2016-07-04 MED ORDER — DIAZEPAM 10 MG PO TABS: 5.0000 mg | ORAL_TABLET | Freq: Every day | ORAL | 0 refills | Status: DC | PRN

## 2016-07-04 MED ORDER — ALBUTEROL SULFATE (2.5 MG/3ML) 0.083% IN NEBU: 2.5000 mg | INHALATION_SOLUTION | RESPIRATORY_TRACT | Status: DC | PRN

## 2016-07-04 MED ORDER — GABAPENTIN 600 MG PO TABS: 600.0000 mg | ORAL_TABLET | ORAL | 0 refills | Status: DC

## 2016-07-04 MED ORDER — METHADONE HCL 10 MG PO TABS: 10.0000 mg | ORAL_TABLET | Freq: Three times a day (TID) | ORAL | 0 refills | Status: DC

## 2016-07-04 MED ORDER — INSULIN DETEMIR 100 UNIT/ML ~~LOC~~ SOLN: 24.0000 [IU] | Freq: Two times a day (BID) | SUBCUTANEOUS | 0 refills | Status: DC

## 2016-07-04 MED ORDER — FUROSEMIDE 40 MG PO TABS: 60.0000 mg | ORAL_TABLET | Freq: Two times a day (BID) | ORAL | 0 refills | Status: DC

## 2016-07-04 NOTE — Progress Notes (Signed)
Pt 02 sats are 96% on RA. He states he does not need 02 for transport home. Pt states he will get a cab home. Pt declines HF education. RN has offered Network engineer, and a HF nurse to come talk to him, he has declined all resources.

## 2016-07-04 NOTE — Discharge Summary (Signed)
Physician Discharge Summary  Nicholas Caldwell JXB:[id number] DOB: Feb 05, 1946 DOA: 06/29/2016  PCP: [place] date: 06/29/2016 Discharge date: 07/04/2016  Admitted From: Home  Disposition:  Home   Recommendations for Outpatient Follow-up:  1. Follow up with PCP in 1-2 weeks 2. Please obtain BMP/CBC in one week 3. He will need EKG to monitor QT  4. Needs to follow up with cardiology for further adjustment of medications.  5. High risk for re-adamission due to compliant with medications.     Discharge Condition: stable.  CODE STATUS: Full code.  Diet recommendation: Heart Healthy  Brief/Interim Summary: Brief Narrative:  70 y.o.BM PMHx non-ischemic cardiomyopathy, Chronic Systolic CHF EF 13-08%,  A-fibon Xarelto,Prolonged Q-T interval , HTN, HLD, COPD on 2 L O2 at home PRN,, Tobacco Abuse, DM Type 2 uncontrolled with complication ,and Chronic Pain on Methadone  who presentedwith dyspnea. The patient reportedworsening shortness breath with exertion over a week as well as leg swelling. His shortness of breath got acutely worse, and did not improvewith home albuterol, so he called EMS.   In the ED he was hypoxic on room air, improved with supplemental O2 via BiPAP.  Assessment & Plan:  Acute on chronic hypoxic and hypercarbic respiratory failure -Most likely multifactorial to include acute on chronic systolic CHF +/- COPD flare - wean off BIPAP - cont to diurese  -Titrate O2 to maintain SPO2 89-93% -improved.   Acute on Chronic systolic CHF(states baseline weight is 260 (~117kg)) -Coreg 37.5 mg BID -Digoxin 0.25 mg daily -Lasix 120 mg  Change to BID due to increase Co2.  -Hydralazine 25 mg TID -Imdur 30 mg daily -Spironolactone 25 mg daily -Strict I&O since admission -9.1 L -Daily weight      Filed Weights   07/01/16 0500 07/02/16 0553 07/03/16 0533  Weight: 130.5 kg (287 lb 11.2 oz) 125.3 kg (276 lb 3.2 oz) 123.7 kg (272 lb 9.6 oz)   Plan to  discharge on oral lasix 60 mg BID.  I offer to schedule appointment with his cardiologist, but patient relates that he prefer to call and schedule his appointment.  He agree to have [facility] nurse.   Encephalopathy; secondary to hypercapnia, polypharmacy.  Improved. Adjusting medications.   Prolonged QTc -likely from methadone electrolytes abnormalities.  -reduce methadone dose, monitor on telemetry. Stable on current dose. I have advised patient to take methadone only 3 times a day. He is aware that he needs close follow up for repeat EKG. I will contact [facility] who provide prescriptions for patient.  -  EKG 8/30 QT 449-- repeat in am.   Atrial fibrillation(CHADS2-VASc 5) -Rate controlled - on Xarelto  Pulmonary hypertension -See CHF   COPD with acute exacerbation -No wheezing, DC steroids  -Albuterol nebulizer QID  Elevated troponin -Suspect this is from CHF - follow to peak - no chest pain   DM Type 2 uncontrolled with complication (neuropathy) -8/28 Hemoglobin A1c= 9.4 -Levemir decreased to 24 units due to hypoglycemia.  -Resistant SSI   Chronic pain Dr Clydene Laming have confirmed in the [place] that the pt is prescribed methadone 98m QID dosing at a pain clinic .  -QT not prolong. resume methadone TID. Monitor QT interval.  -Lower neurontin dose due to lethargy - follow   Normocytic anemia Chronic, stable   Questionable history of seizures Patient states he takes diazepam 10 mg "to prevent seizures" - Diazepam reduced dose given respiratory suppression     Discharge Diagnoses:  Principal Problem:   Acute  on chronic systolic CHF (congestive heart failure) (HCC) Active Problems:   Essential hypertension   Type 2 diabetes mellitus with diabetic neuropathy, with long-term current use of insulin (HCC)   Elevated troponin I level   COPD exacerbation (HCC)   Acute respiratory failure with hypoxia and hypercarbia (HCC)   Chronic pain    Persistent atrial fibrillation (HCC)   Normocytic anemia   Uncontrolled type 2 diabetes mellitus with complication (HCC)   Diabetic polyneuropathy associated with diabetes mellitus due to underlying condition [facility])    Discharge Instructions  Discharge Instructions    Diet - low sodium heart healthy    Complete by:  As directed   Increase activity slowly    Complete by:  As directed       Medication List    STOP taking these medications   amiodarone 200 MG tablet Commonly known as:  PACERONE   FLUoxetine 20 MG tablet Commonly known as:  PROZAC     TAKE these medications   albuterol 108 (90 Base) MCG/ACT inhaler Commonly known as:  PROVENTIL HFA;VENTOLIN HFA Inhale 2 puffs into the lungs every 6 (six) hours as needed for wheezing or shortness of breath.   budesonide-formoterol 160-4.5 MCG/ACT inhaler Commonly known as:  SYMBICORT Inhale 2 puffs into the lungs 2 (two) times daily. Rinse mouth well with water after each use   Carboxymethylcellulose Sodium 0.25 % Soln Place 1 drop into both eyes 4 (four) times daily.   carvedilol 25 MG tablet Commonly known as:  COREG Take 1.5 tablets (37.5 mg total) by mouth 2 (two) times daily with a meal. What changed:  how much to take   diazepam 10 MG tablet Commonly known as:  VALIUM Take 0.5 tablets (5 mg total) by mouth daily as needed for anxiety or sleep. What changed:  how much to take   diclofenac sodium 1 % Gel Commonly known as:  VOLTAREN Apply 2 g topically 4 (four) times daily. What changed:  when to take this  reasons to take this   digoxin 0.125 MG tablet Commonly known as:  LANOXIN Take 1 tablet (0.125 mg total) by mouth daily.   fluticasone 50 MCG/ACT nasal spray Commonly known as:  FLONASE Place 2 sprays into both nostrils daily.   furosemide 40 MG tablet Commonly known as:  LASIX Take 1.5 tablets (60 mg total) by mouth 2 (two) times daily.   gabapentin 600 MG tablet Commonly known as:   NEURONTIN Take 1-3 tablets (600-1,800 mg total) by mouth See admin instructions. Take 1 tablet (600 mg) by mouth daily with lunch,3 tablets (1800 mg) at bedtime What changed:  additional instructions   glipiZIDE 10 MG tablet Commonly known as:  GLUCOTROL Take 10 mg by mouth 2 (two) times daily before a meal.   glucose 4 GM chewable tablet Chew 4 tablets by mouth See admin instructions. Chew 4 tablets by mouth as needed for low blood sugar - repeat every 15 minutes if blood sugar less than 70   hydrALAZINE 25 MG tablet Commonly known as:  APRESOLINE Take 1 tablet (25 mg total) by mouth 3 (three) times daily.   hydrocortisone 1 % lotion Apply 1 application topically See admin instructions. Apply small amount to back twice daily for itchy rash   hydrocortisone 2.5 % rectal cream Commonly known as:  ANUSOL-HC Place 1 application rectally 2 (two) times daily as needed for hemorrhoids or itching. Use up to 2 weeks at a time as needed   insulin detemir 100  UNIT/ML injection Commonly known as:  LEVEMIR Inject 0.24 mLs (24 Units total) into the skin every 12 (twelve) hours. What changed:  how much to take   isosorbide mononitrate 30 MG 24 hr tablet Commonly known as:  IMDUR Take 1 tablet (30 mg total) by mouth daily.   metFORMIN 500 MG 24 hr tablet Commonly known as:  GLUCOPHAGE-XR Take 1,000 mg by mouth 2 (two) times daily with a meal.   methadone 10 MG tablet Commonly known as:  DOLOPHINE Take 1 tablet (10 mg total) by mouth every 8 (eight) hours. What changed:  when to take this   potassium chloride SA 20 MEQ tablet Commonly known as:  K-DUR,KLOR-CON Take 2 tablets (40 mEq total) by mouth daily.   rivaroxaban 20 MG Tabs tablet Commonly known as:  XARELTO Take 1 tablet (20 mg total) by mouth daily with supper.   rosuvastatin 40 MG tablet Commonly known as:  CRESTOR Take 20 mg by mouth daily. For cholesterol   sacubitril-valsartan 49-51 MG Commonly known as:   ENTRESTO Take 1 tablet by mouth 2 (two) times daily.   spironolactone 25 MG tablet Commonly known as:  ALDACTONE Take 1 tablet (25 mg total) by mouth daily.      Follow-up Information    [facility] .   Contact information: [place] [address] [phone]        Larae Grooms, MD Follow up in 1 week(s).   Specialties:  Cardiology, Radiology, Interventional Cardiology Contact information: [address] [place] [address] [phone]          No Known Allergies  Consultations:  none   Procedures/Studies: Dg Chest Portable 1 View  Result Date: 06/29/2016 CLINICAL DATA:  70 y/o M; increasing shortness of breath with history of COPD and smoking. EXAM: PORTABLE CHEST 1 VIEW COMPARISON:  05/25/2016 chest radiograph. FINDINGS: Stable cardiomediastinal silhouette given projection and technique. Interval increase in right greater than left perihilar and basilar opacities and blunted costophrenic angles in comparison with prior radiographs. No acute osseous abnormality is evident. IMPRESSION: Increasing right greater than left and basilar opacities probably represents pulmonary edema, possibly pneumonia. Small bilateral pleural effusions. Electronically Signed   By: Kristine Garbe M.D.   On: 06/29/2016 20:35    ECHO ef 25 %   Subjective: He is feeling at baseline. Breathing better.   Discharge Exam: Vitals:   07/03/16 2106 07/04/16 0426  BP: 111/76 115/75  Pulse:  88  Resp: 19 17  Temp:  97.7 F (36.5 C)   Vitals:   07/03/16 1947 07/03/16 2005 07/03/16 2106 07/04/16 0426  BP: (!) 99/58  111/76 115/75  Pulse: 86 86  88  Resp: _0 Temp: 98.4 F (36.9 C)   97.7 F (36.5 C)  TempSrc: Oral   Oral  SpO2: 100% 99%  99%  Weight:    124.6 kg (274 lb 11.2 oz)  Height:        General: Pt is alert, awake, not in acute distress Cardiovascular: RRR, S1/S2 +, no rubs, no  gallops Respiratory: CTA bilaterally, no wheezing, no rhonchi Abdominal: Soft, NT, ND, bowel sounds + Extremities: no edema, no cyanosis    The results of significant diagnostics from this hospitalization (including imaging, microbiology, ancillary and laboratory) are listed below for reference.     Microbiology: Recent Results (from the past 240 hour(s))  MRSA PCR Screening     Status: None   Collection Time: 06/30/16  2:35 AM  Result Value Ref Range Status   MRSA by PCR NEGATIVE NEGATIVE Final    Comment:        The GeneXpert MRSA Assay (FDA approved for NASAL specimens only), is one component of a comprehensive MRSA colonization surveillance program. It is not intended to diagnose MRSA infection nor to guide or monitor treatment for MRSA infections.      Labs: BNP (last 3 results)  Recent Labs  03/24/16 1656 05/25/16 1904 06/29/16 1946  BNP 191.4* 774.6* 562.1*   Basic Metabolic Panel:  Recent Labs Lab 06/30/16 0815 07/01/16 0439 07/02/16 0955 07/03/16 0424 07/04/16 0536  NA 138 139 138 143 140  K 4.4 4.2 3.8 3.9 4.1  CL 100* 99* 95* 98* 94*  CO2 31 33* 32 37* 35*  GLUCOSE 179* 167* 176* 94 178*  BUN 11 16 22* 22* 20  CREATININE 0.80 0.79 0.90 0.86 0.87  CALCIUM 8.2* 8.0* 8.2* 8.3* 8.4*  MG  --   --   --   --  2.0   Liver Function Tests:  Recent Labs Lab 06/29/16 1946 07/01/16 0439  AST 22 17  ALT 23 20  ALKPHOS 107 90  BILITOT 0.6 0.2*  PROT 6.6 5.7*  ALBUMIN 3.0* 2.5*   No results for input(s): LIPASE, AMYLASE in the last 168 hours. No results for input(s): AMMONIA in the last 168 hours. CBC:  Recent Labs Lab 06/29/16 1946 06/30/16 0815 07/01/16 0439 07/03/16 0424  WBC 7.7 6.7 9.4 8.9  NEUTROABS 6.2  --   --   --   HGB 11.9* 12.0* 11.5* 12.4*  HCT 39.5 39.7 37.4* 41.1  MCV 87.6 87.3 86.0 86.7  PLT 341 328 313 370   Cardiac Enzymes:  Recent Labs Lab 06/29/16 1946 06/30/16 0311 06/30/16 0815  TROPONINI 0.03* 0.04* 0.03*    BNP: Invalid input(s): POCBNP CBG:  Recent Labs Lab 07/03/16 1118 07/03/16 1612 07/03/16 2007 07/03/16 2115 07/04/16 0729  GLUCAP 181* 210* 314* 268* 158*   D-Dimer No results for input(s): DDIMER in the last 72 hours. Hgb A1c No results for input(s): HGBA1C in the last 72 hours. Lipid Profile No results for input(s): CHOL, HDL, LDLCALC, TRIG, CHOLHDL, LDLDIRECT in the last 72 hours. Thyroid function studies No results for input(s): TSH, T4TOTAL, T3FREE, THYROIDAB in the last 72 hours.  Invalid input(s): FREET3 Anemia work up No results for input(s): VITAMINB12, FOLATE, FERRITIN, TIBC, IRON, RETICCTPCT in the last 72 hours. Urinalysis    Component Value Date/Time   COLORURINE YELLOW 10/26/2014 2206   APPEARANCEUR CLEAR 10/26/2014 2206   LABSPEC 1.017 10/26/2014 2206   PHURINE 6.0 10/26/2014 2206   GLUCOSEU NEGATIVE 10/26/2014 2206   HGBUR NEGATIVE 10/26/2014 2206   BILIRUBINUR NEGATIVE 10/26/2014 2206   KETONESUR NEGATIVE 10/26/2014 2206   PROTEINUR NEGATIVE 10/26/2014 2206   UROBILINOGEN 0.2 10/26/2014 2206   NITRITE NEGATIVE 10/26/2014 2206   LEUKOCYTESUR NEGATIVE 10/26/2014 2206   Sepsis Labs Invalid input(s): PROCALCITONIN,  WBC,  LACTICIDVEN Microbiology Recent Results (from the past 240 hour(s))  MRSA PCR Screening     Status: None   Collection Time: 06/30/16  2:35 AM  Result Value Ref Range Status   MRSA by PCR NEGATIVE NEGATIVE Final    Comment:        The GeneXpert MRSA Assay (FDA approved for NASAL specimens only), is one component of a comprehensive MRSA colonization surveillance program. It is not intended to diagnose MRSA infection nor to guide or monitor treatment for MRSA infections.  Time coordinating discharge: Over 30 minutes  SIGNED:   Elmarie Shiley, MD  [facility] 07/04/2016, 10:32 AM Pager [phone]  If 7PM-7AM, please contact night-coverage [email or web address] Password [email or web address]

## 2016-07-04 NOTE — Progress Notes (Signed)
Pt discharged home. Discharge instructions have been gone over with the patient. IV's removed. Pt given unit number and told to call if they have any concerns regarding their discharge instructions. Joanell Cressler V, RN   

## 2016-07-04 NOTE — Care Management Important Message (Signed)
Important Message  Patient Details  Name: Nicholas Caldwell MRN: [id number] Date of Birth: 06-04-1946   Medicare Important Message Given:  Yes    Jesslyn Viglione [place] 07/04/2016, 10:06 AM

## 2016-07-04 NOTE — Progress Notes (Signed)
Patient placed on hospital CPAP machine with no complications.

## 2016-07-04 NOTE — Progress Notes (Signed)
Heart Failure Navigator Consult Note  Presentation:  Per Dr. Loleta Books- Nicholas Caldwell presented withis a 70 y.o. male with a past medical history significant for NICM and chronic systolic CHF EF 62-70%, COPD not on home O2, Afib on Xarelto, NIDDM and chronic pain on methadone who presents with dyspnea.  The patient reports worsening shortness breath with exertion over the last week as well as leg swelling.  He denies fever, chills, rigors.  Has a chronic productive cough, worse with shortness of breath lately.  Chronic orthopnea and leg swelling, worse lately, as well as with new dyspnea waking him from sleep.  Today his shortness of breath got acutely worse, was not improved with home albuterol and so he called EMS.  EMS found him wheezy and gave albuterol en route  Past Medical History:  Diagnosis Date  . Atrial flutter ([place])    a. recurrent AFlutter with RVR;  b. Amiodarone Rx started 4/16  . CAD (coronary artery disease)    a. LHC 1/16:  mLAD diffuse disease, pLCx mild disease, dLCx with disease but too small for PCI, RCA ok, EF 25-30%  . Chronic systolic CHF (congestive heart failure) ([place])   . COPD (chronic obstructive pulmonary disease) ([place])   . Diabetes mellitus without complication ([place])   . Hypercholesteremia   . Hypertension   . NICM (nonischemic cardiomyopathy) ([place])    a. Echo 4/16:  EF 25-30%, diffuse HK, mild MR, severe LAE  . Persistent atrial fibrillation ([place])   . Prolonged Q-T interval on ECG   . Tobacco abuse     Social History   Social History  . Marital status: Single    Spouse name: N/A  . Number of children: N/A  . Years of education: N/A   Social History Main Topics  . Smoking status: Current Every Day Smoker    Packs/day: 0.05    Types: Cigarettes    Last attempt to quit: 08/30/2015  . Smokeless tobacco: Never Used  . Alcohol use No  . Drug use: No  . Sexual activity: Not Asked   Other Topics Concern  . None   Social History Narrative  . None     ECHO:Study Conclusions-07/01/16  - Left ventricle: The cavity size was moderately dilated. Wall   thickness was increased in a pattern of moderate LVH. Systolic   function was severely reduced. The estimated ejection fraction   was in the range of 25% to 30%. - Mitral valve: There was mild to moderate regurgitation. - Left atrium: The atrium was severely dilated. - Right ventricle: The cavity size was mildly dilated. Systolic   function was mildly to moderately reduced. - Right atrium: The atrium was severely dilated. - Tricuspid valve: There was mild-moderate regurgitation. - Pulmonary arteries: Systolic pressure was moderately increased.   PA peak pressure: 54 mm Hg (S).  ------------------------------------------------------------------- Study data:  No prior study was available for comparison.  Study status:  Routine.  Procedure:  The patient reported no pain pre or post test. Transthoracic echocardiography. The study was technically difficult, as a result of poor acoustic windows. Intravenous contrast (Definity) was administered.  Study completion:  There were no complications.          Transthoracic echocardiography.  M-mode, complete 2D, spectral Doppler, and color Doppler.  Birthdate:  Patient birthdate: 02-02-1946.  Age:  Patient is 70 yr old.  Sex:  Gender: male.    BMI: 39 kg/m^2.  Blood pressure:     115/86  Patient status:  Inpatient.  Study date: Study date: 07/01/2016. Study time: 10:23 AM.  Location:  ICU/CCU  BNP    Component Value Date/Time   BNP 660.8 (H) 06/29/2016 1946   BNP 191.4 (H) 03/24/2016 1656    ProBNP    Component Value Date/Time   PROBNP 363.0 (H) 08/02/2015 1714     Education Assessment and Provision:  Detailed education and instructions provided on heart failure disease management including the following:  Signs and symptoms of Heart Failure When to call the physician Importance of daily weights Low sodium diet Fluid  restriction Medication management Anticipated future follow-up appointments  Patient education given on each of the above topics.  Patient acknowledges understanding and acceptance of all instructions.  I spoke with Nicholas Caldwell regarding his HF and current diagnosis.  He would not engage in conversation and was difficult to get information from.  He says that all he needs from me is to "take out IV and get the wheelchair to take me out".  He tells me that he has a scale and has been told before that he has HF.  I reviewed the importance of daily weights and when to contact the physician related to weight increases.  I briefly reviewed a low sodium diet and high sodium foods to avoid.   He denies any issues with getting or taking prescribed medications.  He follows with [name]).  I will forward his information to scheduler as I was on hold for 20 minutes in unsuccessful attempt to make him an appt.before discharge.  Education Materials:  "Living Better With Heart Failure" Booklet, Daily Weight Tracker Tool    High Risk Criteria for Readmission and/or Poor Patient Outcomes:   EF <30%- yes 25-30%  2 or more admissions in 6 months- Yes 2/6 mo  Difficult social situation- No?  Demonstrates medication noncompliance- Denies    Barriers of Care:  Health literacy, Knowledge and insight into his own health conditions, compliance  Discharge Planning:   Plans to discharge to home with his wife in [place]

## 2016-07-04 NOTE — Progress Notes (Signed)
Inpatient Diabetes Program Recommendations  AACE/ADA: New Consensus Statement on Inpatient Glycemic Control (2015)  Target Ranges:  Prepandial:   less than 140 mg/dL      Peak postprandial:   less than 180 mg/dL (1-2 hours)      Critically ill patients:  140 - 180 mg/dL   Lab Results  Component Value Date   GLUCAP 158 (H) 07/04/2016   HGBA1C 9.4 (H) 06/30/2016    Review of Glycemic Control:  Results for DAVAN, NAWABI (MRN [id number]) as of 07/04/2016 09:28  Ref. Range 07/03/2016 07:38 07/03/2016 11:18 07/03/2016 16:12 07/03/2016 20:07 07/03/2016 21:15 07/04/2016 07:29  Glucose-Capillary Latest Ref Range: 65 - 99 mg/dL 108 (H) 181 (H) 210 (H) 314 (H) 268 (H) 158 (H)   Inpatient Diabetes Program Recommendations:    Consider restarting Novolog meal coverage 5 units tid with meals (hold if patient eats less than 50%).  Thanks, Adah Perl, RN, BC-ADM Inpatient Diabetes Coordinator Pager [phone] (8a-5p)

## 2016-07-04 NOTE — Discharge Instructions (Signed)
Information on my medicine - XARELTO (Rivaroxaban)  This medication education was reviewed with me or my healthcare representative as part of my discharge preparation.   Why was Xarelto prescribed for you? Xarelto was prescribed for you to reduce the risk of a blood clot forming that can cause a stroke if you have a medical condition called atrial fibrillation (a type of irregular heartbeat).  What do you need to know about xarelto ? Take your Xarelto ONCE DAILY at the same time every day with your evening meal. If you have difficulty swallowing the tablet whole, you may crush it and mix in applesauce just prior to taking your dose.  Take Xarelto exactly as prescribed by your doctor and DO NOT stop taking Xarelto without talking to the doctor who prescribed the medication.  Stopping without other stroke prevention medication to take the place of Xarelto may increase your risk of developing a clot that causes a stroke.  Refill your prescription before you run out.  After discharge, you should have regular check-up appointments with your healthcare provider that is prescribing your Xarelto.  In the future your dose may need to be changed if your kidney function or weight changes by a significant amount.  What do you do if you miss a dose? If you are taking Xarelto ONCE DAILY and you miss a dose, take it as soon as you remember on the same day then continue your regularly scheduled once daily regimen the next day. Do not take two doses of Xarelto at the same time or on the same day.   Important Safety Information A possible side effect of Xarelto is bleeding. You should call your healthcare provider right away if you experience any of the following: ? Bleeding from an injury or your nose that does not stop. ? Unusual colored urine (red or dark brown) or unusual colored stools (red or black). ? Unusual bruising for unknown reasons. ? A serious fall or if you hit your head (even if there  is no bleeding).  Some medicines may interact with Xarelto and might increase your risk of bleeding while on Xarelto. To help avoid this, consult your healthcare provider or pharmacist prior to using any new prescription or non-prescription medications, including herbals, vitamins, non-steroidal anti-inflammatory drugs (NSAIDs) and supplements.  This website has more information on Xarelto: [email or web address]

## 2016-07-09 DIAGNOSIS — G894 Chronic pain syndrome: Secondary | ICD-10-CM | POA: Diagnosis not present

## 2016-07-09 DIAGNOSIS — Z79891 Long term (current) use of opiate analgesic: Secondary | ICD-10-CM | POA: Diagnosis not present

## 2016-07-09 DIAGNOSIS — M533 Sacrococcygeal disorders, not elsewhere classified: Secondary | ICD-10-CM | POA: Diagnosis not present

## 2016-07-09 DIAGNOSIS — Z79899 Other long term (current) drug therapy: Secondary | ICD-10-CM | POA: Diagnosis not present

## 2016-07-15 ENCOUNTER — Encounter: Payer: Self-pay | Admitting: Physician Assistant

## 2016-07-16 ENCOUNTER — Encounter: Payer: Self-pay | Admitting: Physician Assistant

## 2016-07-16 DIAGNOSIS — I4892 Unspecified atrial flutter: Secondary | ICD-10-CM | POA: Insufficient documentation

## 2016-07-16 DIAGNOSIS — J449 Chronic obstructive pulmonary disease, unspecified: Secondary | ICD-10-CM | POA: Insufficient documentation

## 2016-07-16 DIAGNOSIS — I5022 Chronic systolic (congestive) heart failure: Secondary | ICD-10-CM | POA: Insufficient documentation

## 2016-07-16 NOTE — Progress Notes (Signed)
Pt cancelled  This encounter was created in error - please disregard.

## 2016-07-17 ENCOUNTER — Encounter: Payer: Medicare Other | Admitting: Physician Assistant

## 2016-07-22 ENCOUNTER — Encounter: Payer: Self-pay | Admitting: Physician Assistant

## 2016-07-22 ENCOUNTER — Ambulatory Visit (INDEPENDENT_AMBULATORY_CARE_PROVIDER_SITE_OTHER): Payer: Medicare Other | Admitting: Physician Assistant

## 2016-07-22 ENCOUNTER — Encounter (INDEPENDENT_AMBULATORY_CARE_PROVIDER_SITE_OTHER): Payer: Self-pay

## 2016-07-22 VITALS — BP 160/90 | HR 100 | Ht 72.0 in | Wt 261.8 lb

## 2016-07-22 DIAGNOSIS — I4892 Unspecified atrial flutter: Secondary | ICD-10-CM | POA: Diagnosis not present

## 2016-07-22 DIAGNOSIS — I429 Cardiomyopathy, unspecified: Secondary | ICD-10-CM | POA: Diagnosis not present

## 2016-07-22 DIAGNOSIS — I10 Essential (primary) hypertension: Secondary | ICD-10-CM

## 2016-07-22 DIAGNOSIS — R9431 Abnormal electrocardiogram [ECG] [EKG]: Secondary | ICD-10-CM

## 2016-07-22 DIAGNOSIS — I5022 Chronic systolic (congestive) heart failure: Secondary | ICD-10-CM | POA: Diagnosis not present

## 2016-07-22 DIAGNOSIS — I2510 Atherosclerotic heart disease of native coronary artery without angina pectoris: Secondary | ICD-10-CM

## 2016-07-22 DIAGNOSIS — I480 Paroxysmal atrial fibrillation: Secondary | ICD-10-CM | POA: Diagnosis not present

## 2016-07-22 DIAGNOSIS — I4581 Long QT syndrome: Secondary | ICD-10-CM

## 2016-07-22 DIAGNOSIS — I428 Other cardiomyopathies: Secondary | ICD-10-CM

## 2016-07-22 NOTE — Patient Instructions (Addendum)
Medication Instructions:  Your physician has recommended you make the following change in your medication:  1) INCREASE Lasix to 80 mg tablet by mouth THREE times a day - Until next appt scheduled on 07/25/2016   Labwork: none  Testing/Procedures: none  Follow-Up: Your physician recommends that you schedule a follow-up appointment in: Friday (07/25/2016) with [name], PA   You have been referred to [facility]    Any Other Special Instructions Will Be Listed Below (If Applicable).   ** BRING ALL MEDICATION BOTTLES WITH YOU TO ALL MEDICAL APPOINTMENTS**  If you need a refill on your cardiac medications before your next appointment, please call your pharmacy.

## 2016-07-22 NOTE — Progress Notes (Addendum)
Cardiology Office Note    Date:  07/22/2016  ID:  Nicholas Caldwell, DOB Apr 06, 1946, MRN [id number] PCP:  [facility]  Cardiologist:  Dr. Casandra Doffing   Electrophysiologist:  Dr. Thompson Grayer   Chief Complaint: f/u CHF  History of Present Illness:  Nicholas Caldwell is a 70 y.o. male with history of nonischemic cardiomyopathy dx 5/27, chronic systolic HF, paroxysmal atrial fib/flutter, HTN, HL, diabetes c/b neuropathy, chronic pain on methadone followed at pain clinic, questionable history of seizures, tobacco abuse, morbid obesity, COPD with chronic respiratory failure who presents for cardiology follow-up.   To recap hx - LHC January 2016 demonstrated mild nonobstructive CAD. Regarding his afib,he was previously seen by EP and not a candidate for flecainide due to cardiomyopathy, not a candidate for Tikosyn due to prolonged QT, and felt to be a poor candidate for ablation given left atrial size. Dr. Rayann Heman has felt that RFCA of his aflutter would result in more afib. He was screened for Genetic AF and was not felt to be a good candidate. Rate control was recommended. He was on amiodarone most recently. Per notes patient has previously declined ICD. Admitted 05/2016 for COPD exacerbation, hypoxia, and hypercarbia - his hypoxia was felt possibly due to a/c bronchitis, possibly CHF or impromper methadone use. Admitted 8/27-07/04/16 with acute on chronic hypoxic and hypercarbic respiratory failure felt multifactorial due to a/c CHF and COPD flare. He had prolonged QT felt due to his methadone use, which was adjusted by IM. Adm weight 280lb, dc weight 274lb. His amiodarone was stopped per AVS but no commentary in notes regarding this. Last labs with Cr 0.87, Hgb 12.4. Last 2D echo 07/01/16: mod dilated LV, mod LVH, EF 25-30%, mild-mod MR, sev LAE, mild-mod reduced RV systolic function, mild-mod TR, PASP 70mHg.   He has several prior cancellations/no-shows listed in his chart. There have been  transportation issues in the past. He did not go for PFTs as previously recommended (states [facility] He cancelled post-hospital follow-up last week and rescheduled for today. He arrived 40 minutes late for his appointment. We were able to accommodate him as an add-on an hour later. He expressed displeasure at the wait to our [place] He complains of worsening dyspnea and LEE, though not quite yet to the point that prompted hospitalization. He feels SOB with any activity. Denies any chest pain. His BP is high today. He says he did not take his Lasix or spironolactone yet today because he did not want to have to urinate so frequently while out and about. He also did not take his carvedilol. He says he is only taking 1 tablet BID rather than 1.5 tabs per prior recommendation. His Lasix dose was listed as 1.5 tablets BID and he says he's actually taking 2 tablets BID. He reports compliance with sodium and fluid restriction.   Past Medical History:  Diagnosis Date  . Atrial flutter ([place]    a. recurrent AFlutter with RVR;  b. Amiodarone Rx started 4/16  . CAD (coronary artery disease)    a. LHC 1/16:  mLAD diffuse disease, pLCx mild disease, dLCx with disease but too small for PCI, RCA ok, EF 25-30%  . Chronic pain   . Chronic systolic CHF (congestive heart failure) ([facility]   . COPD (chronic obstructive pulmonary disease) ([facility]   . Diabetes mellitus without complication ([facility]   . Hypercholesteremia   . Hypertension   . NICM (nonischemic cardiomyopathy) ([facility]  a.dx 2016. b. 2D echo 06/2016 - Last echo 07/01/16: mod dilated LV, mod LVH, EF 25-30%, mild-mod MR, sev LAE, mild-mod reduced RV systolic function, mild-mod TR, PASP 30mHG.  .Marland KitchenPAF (paroxysmal atrial fibrillation) (HCC)    On amio - ot a candidate for flecainide due to cardiomyopathy, not a candidate for Tikosyn due to prolonged QT, and felt to be a poor candidate for ablation given left atrial size.  . Prolonged  Q-T interval on ECG   . Pulmonary hypertension ([place]   . Tobacco abuse     Past Surgical History:  Procedure Laterality Date  . LEFT AND RIGHT HEART CATHETERIZATION WITH CORONARY ANGIOGRAM N/A 11/06/2014   Procedure: LEFT AND RIGHT HEART CATHETERIZATION WITH CORONARY ANGIOGRAM;  Surgeon: JJettie Booze MD;  [facility];  Service: Cardiovascular;  Laterality: N/A;    Current Medications: Current Outpatient Prescriptions  Medication Sig Dispense Refill  . albuterol (PROVENTIL HFA;VENTOLIN HFA) 108 (90 BASE) MCG/ACT inhaler Inhale 2 puffs into the lungs every 6 (six) hours as needed for wheezing or shortness of breath. 1 Inhaler 2  . budesonide-formoterol (SYMBICORT) 160-4.5 MCG/ACT inhaler Inhale 2 puffs into the lungs 2 (two) times daily. Rinse mouth well with water after each use    . Carboxymethylcellulose Sodium 0.25 % SOLN Place 1 drop into both eyes 4 (four) times daily.    . carvedilol (COREG) 25 MG tablet Take 25 mg by mouth 2 (two) times daily with a meal.    . diazepam (VALIUM) 10 MG tablet Take 0.5 tablets (5 mg total) by mouth daily as needed for anxiety or sleep. 30 tablet 0  . diclofenac sodium (VOLTAREN) 1 % GEL Apply 2 g topically 2 (two) times daily as needed (for pain).    .Marland Kitchendigoxin (LANOXIN) 0.125 MG tablet Take 1 tablet (0.125 mg total) by mouth daily. 30 tablet 11  . fluticasone (FLONASE) 50 MCG/ACT nasal spray Place 2 sprays into both nostrils daily.    . furosemide (LASIX) 40 MG tablet Take 1.5 tablets (60 mg total) by mouth 2 (two) times daily. 30 tablet 0  . gabapentin (NEURONTIN) 600 MG tablet Take 1-3 tablets (600-1,800 mg total) by mouth See admin instructions. Take 1 tablet (600 mg) by mouth daily with lunch,3 tablets (1800 mg) at bedtime 30 tablet 0  . glipiZIDE (GLUCOTROL) 10 MG tablet Take 10 mg by mouth 2 (two) times daily before a meal.     . glucose 4 GM chewable tablet Chew 4 tablets by mouth See admin instructions. Chew 4 tablets by mouth as  needed for low blood sugar - repeat every 15 minutes if blood sugar less than 70    . hydrALAZINE (APRESOLINE) 25 MG tablet Take 1 tablet (25 mg total) by mouth 3 (three) times daily. 90 tablet 11  . hydrocortisone (ANUSOL-HC) 2.5 % rectal cream Place 1 application rectally 2 (two) times daily as needed for hemorrhoids or itching. Use up to 2 weeks at a time as needed    . hydrocortisone 1 % lotion Apply 1 application topically See admin instructions. Apply small amount to back twice daily for itchy rash    . insulin detemir (LEVEMIR) 100 UNIT/ML injection Inject 0.24 mLs (24 Units total) into the skin every 12 (twelve) hours. 10 mL 0  . isosorbide mononitrate (IMDUR) 30 MG 24 hr tablet Take 1 tablet (30 mg total) by mouth daily. 30 tablet 3  . metFORMIN (GLUCOPHAGE-XR) 500 MG 24 hr tablet Take 1,000 mg by mouth 2 (  two) times daily with a meal.    . methadone (DOLOPHINE) 10 MG tablet Take 1 tablet (10 mg total) by mouth every 8 (eight) hours. 1 tablet 0  . potassium chloride SA (K-DUR,KLOR-CON) 20 MEQ tablet Take 2 tablets (40 mEq total) by mouth daily. 30 tablet 0  . rivaroxaban (XARELTO) 20 MG TABS tablet Take 1 tablet (20 mg total) by mouth daily with supper. 30 tablet 0  . rosuvastatin (CRESTOR) 40 MG tablet Take 20 mg by mouth daily. For cholesterol    . sacubitril-valsartan (ENTRESTO) 49-51 MG Take 1 tablet by mouth 2 (two) times daily. 60 tablet 11  . spironolactone (ALDACTONE) 25 MG tablet Take 1 tablet (25 mg total) by mouth daily. 30 tablet 11   No current facility-administered medications for this visit.      Allergies:   Review of patient's allergies indicates no known allergies.   Social History   Social History  . Marital status: Single    Spouse name: N/A  . Number of children: N/A  . Years of education: N/A   Social History Main Topics  . Smoking status: Current Every Day Smoker    Packs/day: 0.05    Types: Cigarettes    Last attempt to quit: 08/30/2015  . Smokeless  tobacco: Never Used  . Alcohol use No  . Drug use: No  . Sexual activity: Not Asked   Other Topics Concern  . None   Social History Narrative  . None     Family History:  The patient's family history includes Heart disease in his father and mother; Heart failure in his mother; Hypertension in his mother and sister.   ROS:   Please see the history of present illness. No syncope. All other systems are reviewed and otherwise negative.    PHYSICAL EXAM:   VS:  BP (!) 160/90   Pulse 100   Ht 6' (1.829 m)   Wt 261 lb 12.8 oz (118.8 kg)   SpO2 98%   BMI 35.51 kg/m   BMI: Body mass index is 35.51 kg/m. GEN: Well nourished, well developed obese AAM, in no acute distress  HEENT: normocephalic, atraumatic Neck: no carotid bruits, or masses. JVD difficult to assess given neck habitus Cardiac: RRR; no murmurs, rubs, or gallops, large baseline leg habitus with 1+ BLE edema Respiratory:  Diffusely diminished bilaterally, no wheezes or rhonchi GI: soft, nontender, nondistended, + BS MS: no deformity or atrophy  Skin: warm and dry, no rash Neuro:  Alert and Oriented x 3, Strength and sensation are intact, follows commands Psych: euthymic mood, full affect  Wt Readings from Last 3 Encounters:  07/22/16 261 lb 12.8 oz (118.8 kg)  07/04/16 274 lb 11.2 oz (124.6 kg)  05/25/16 278 lb 4.8 oz (126.2 kg)      Studies/Labs Reviewed:   EKG:  EKG was ordered today and personally reviewed by me and demonstrates atrial flutter 101bpm, QTC 452m  Recent Labs: 08/02/2015: Pro B Natriuretic peptide (BNP) 363.0 03/24/2016: TSH 1.32 06/29/2016: B Natriuretic Peptide 660.8 07/01/2016: ALT 20 07/03/2016: Hemoglobin 12.4; Platelets 370 07/04/2016: BUN 20; Creatinine, Ser 0.87; Magnesium 2.0; Potassium 4.1; Sodium 140   Lipid Panel    Component Value Date/Time   CHOL 147 11/01/2014 0216   TRIG 134 11/01/2014 0216   HDL 48 11/01/2014 0216   CHOLHDL 3.1 11/01/2014 0216   VLDL 27 11/01/2014 0216    LDLCALC 72 11/01/2014 0216    Additional studies/ records that were reviewed today include: Summarized above.  ASSESSMENT & PLAN:   1. Chronic systolic CHF/NICM with biventricular failure - I am worried about Mr. Ledet long-term prognosis. I am not convinced of his overall compliance. His BP was actually on the low side when he was on his HF regimen in the hospital, but today it is elevated. He did not bring any of his medicines with him. He's not sure why his Coreg dose is now down to 63m BID. I spent a lot of time explaining his heart failure, reviewing sodium and fluid restriction, and symptoms of CHF. His weight is down by our scale, but he has recurrent edema and SOB. He does not feel he needs to go back to the hospital. Will attempt to manage his HF as an outpatient. I advised him to take his missed Lasix, spironolactone, and Coreg as soon as he gets home, and to increase Lasix to 825mTID. Will have him return for close follow-up on Friday 07/25/16 - would recommend f/u labs at that visit. I have told him it is absolutely necessary that he bring his medication bottles to that visit so we can sort out exactly what dose he is taking. I believe he would benefit from referral to the Advanced Heart Failure clinic to help arrange additional services i.e. paramedicine etc to help improve compliance and optimize regimen. 2. Paroxysmal atrial fib/flutter - remains in atrial flutter. HR slightly elevated but he missed Coreg today. F/u HR on Friday. Amiodarone was stopped in the hospital recently - it is not commented on, but suspect it was due to his prolonged QT interval. It may be worthwhile to hold this for now in light of his underlying lung disease and poor functional reserve. 3. Prolonged QT interval - improved. 4. HTN - elevated. See above regarding med adjustment. I do not feel we should make any additional med changes beyond the Lasix until we actually see what med bottles he has been  taking.  Disposition: F/u with APP on Friday. I tried to get him in to see ScRichardson DoppA-C during his morning clinic for continuity on Friday but the patient declined due to transportation issues. He is agreeable to coming in that afternoon. Have also placed referral to Advanced HF clinic.   Medication Adjustments/Labs and Tests Ordered: Current medicines are reviewed at length with the patient today.  Concerns regarding medicines are outlined above. Medication changes, Labs and Tests ordered today are summarized above and listed in the Patient Instructions accessible in Encounters.   SiRaechel AcheA-C  07/22/2016 4:00 PM    [facility] [address] [place]  [id number]: [phone]: [phone]

## 2016-07-23 ENCOUNTER — Telehealth (HOSPITAL_COMMUNITY): Payer: Self-pay | Admitting: Vascular Surgery

## 2016-07-23 NOTE — Telephone Encounter (Signed)
Left pt VM to call back to make NP appt w/ MD

## 2016-07-23 NOTE — Progress Notes (Deleted)
  ROS ] 

## 2016-07-24 IMAGING — CR DG CHEST 1V PORT
1 series · 1 of 1 positions shown · non-contrast
Comparison: 11/03/2014 and 10/31/2014

CLINICAL DATA: Shortness of breath.  COPD.  Asthma.

EXAM:
PORTABLE CHEST - 1 VIEW

[ap portable]
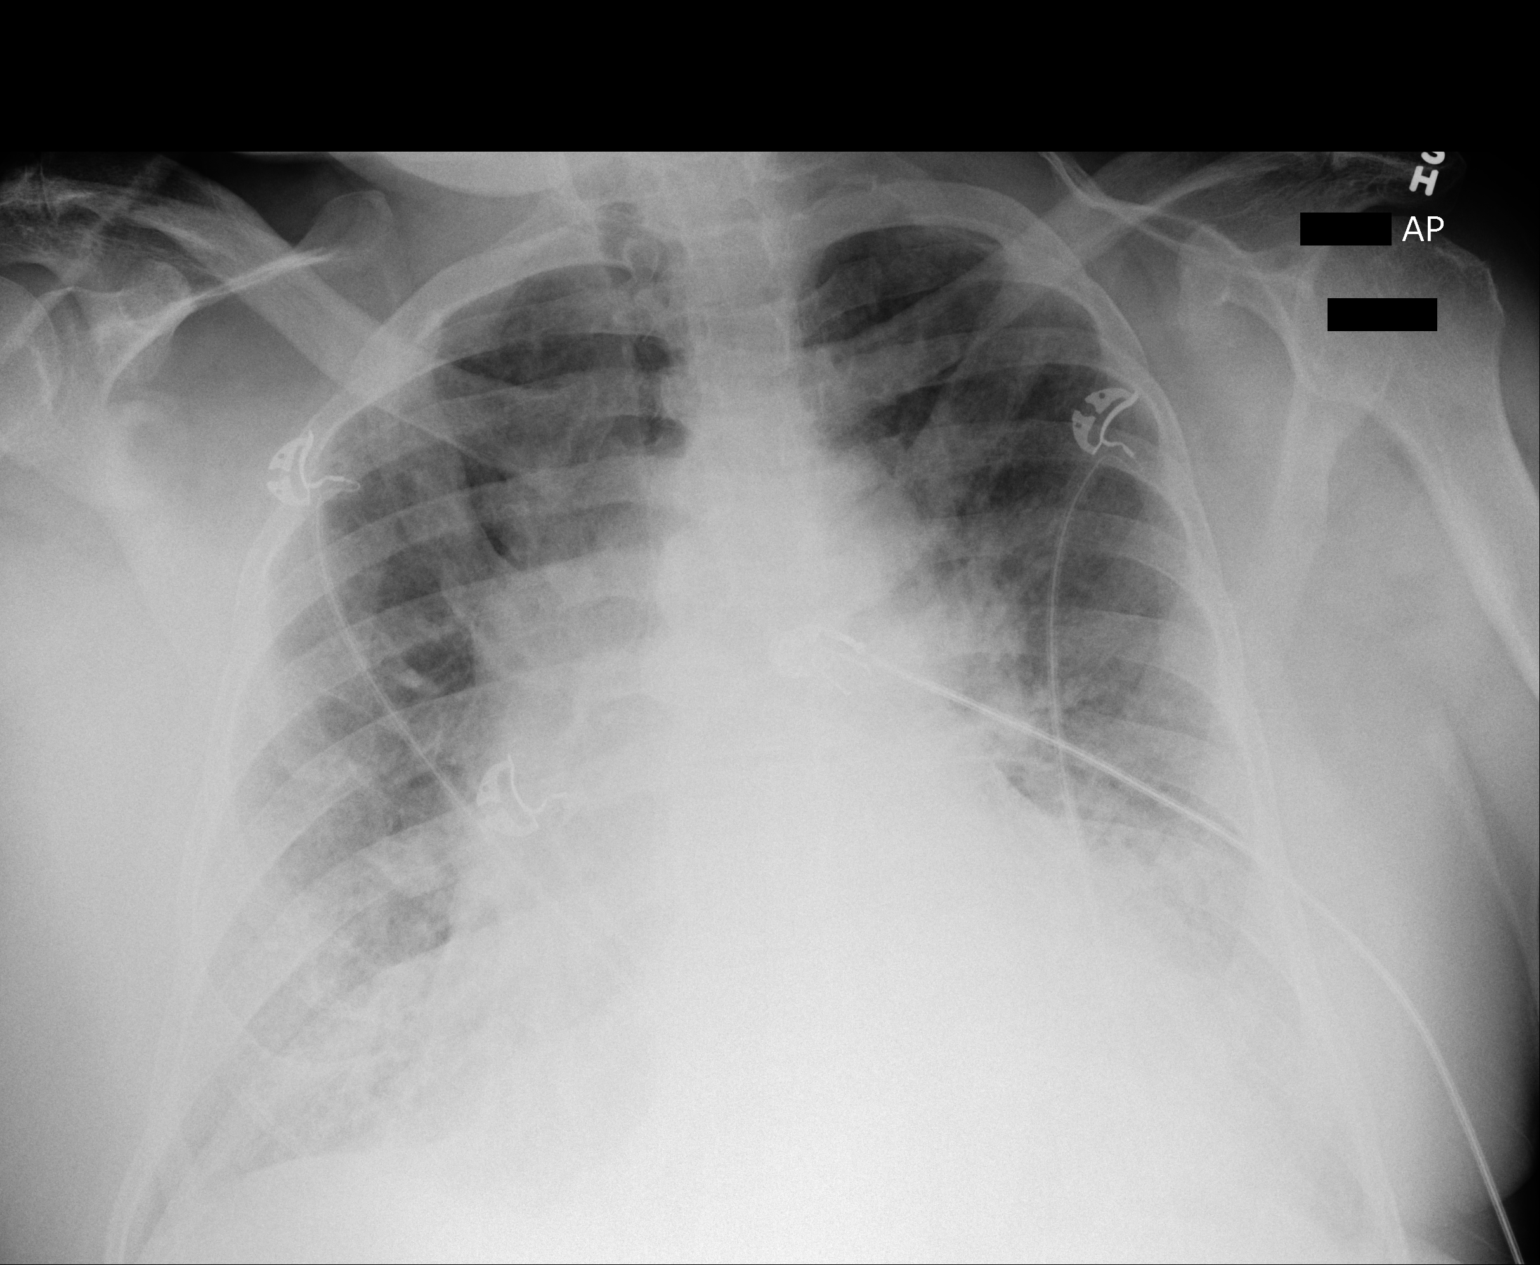

[1 of 1 positions shown; findings below may reference images not displayed]

FINDINGS: Chronic cardiomegaly. Pulmonary vascularity is normal. Haziness at
the lung bases is felt to be overlying soft tissue and chronic lung
disease although the patient may have small bilateral pleural
effusions as well.
IMPRESSION: Chronic cardiomegaly.  Possible small bilateral effusions.

## 2016-07-25 ENCOUNTER — Ambulatory Visit: Payer: Medicare Other | Admitting: Physician Assistant

## 2016-07-25 IMAGING — CR DG CHEST 2V
2 series · 2 of 2 positions shown · non-contrast
Comparison: February 19, 2015.

CLINICAL DATA: Respiratory distress and fever.

EXAM:
CHEST  2 VIEW

[chest pa]
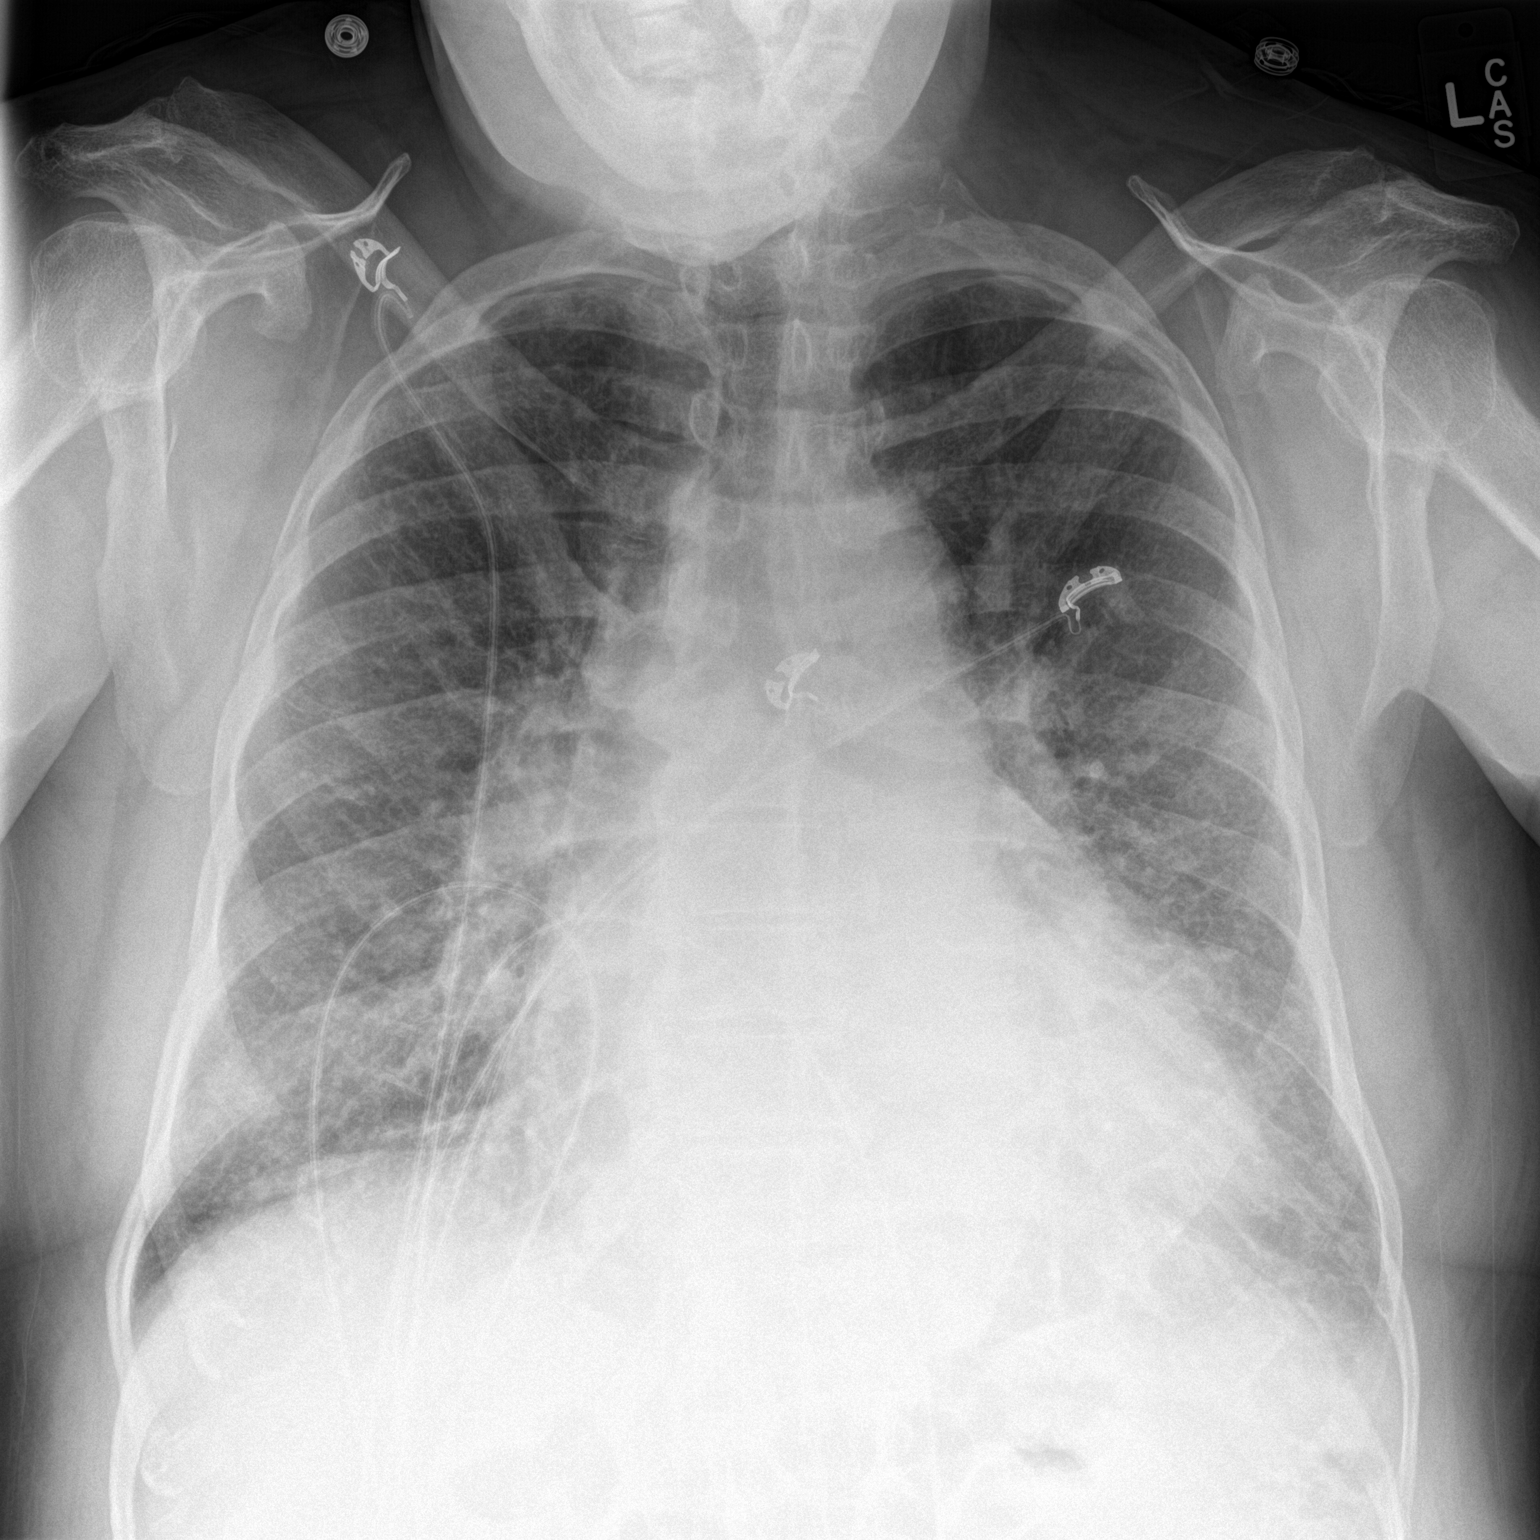

[chest lat]
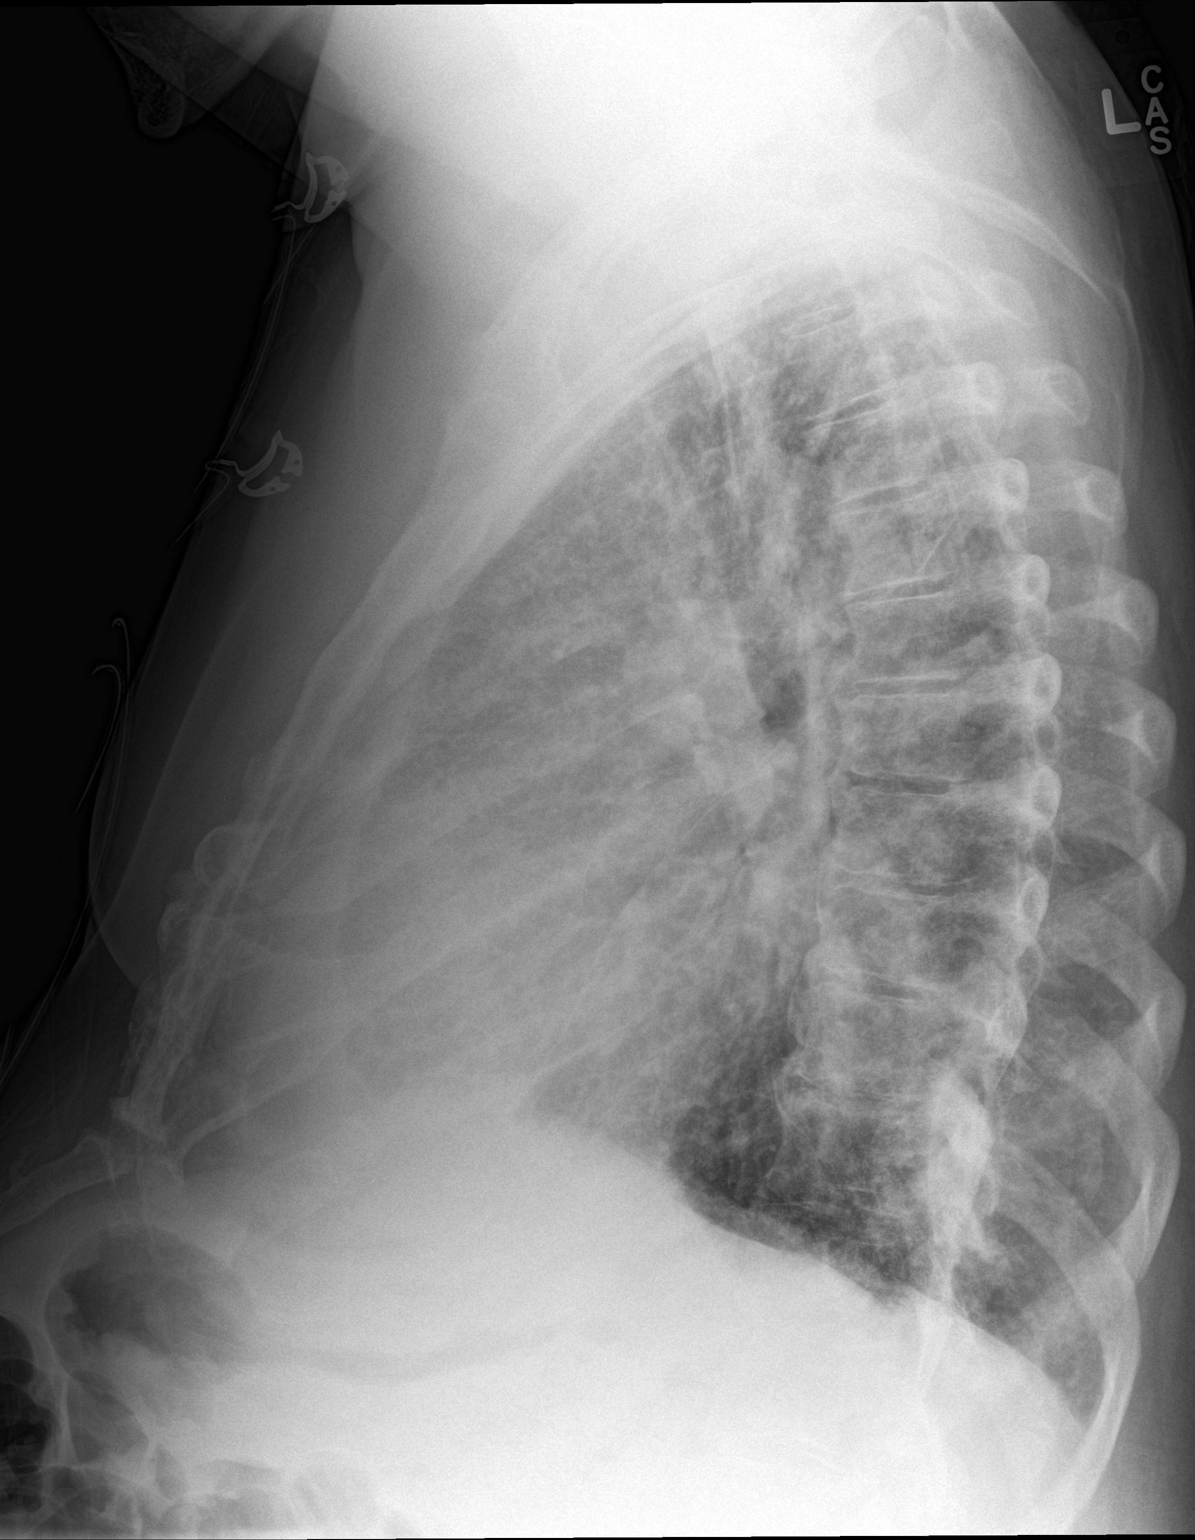

[2 of 2 positions shown; findings below may reference images not displayed]

FINDINGS: Stable cardiomegaly. No pneumothorax or pleural effusion is noted.
Stable mild central pulmonary vascular congestion is noted. Some
degree of perihilar and basilar edema cannot be excluded.
Ossification of anterior longitudinal ligament is noted in lower
thoracic spine.
IMPRESSION: Stable cardiomegaly and central pulmonary vascular congestion is
noted suggesting congestive heart failure. Some degree of bilateral
perihilar and basilar edema may be present.

## 2016-07-27 ENCOUNTER — Inpatient Hospital Stay (HOSPITAL_COMMUNITY)
Admission: EM | Admit: 2016-07-27 | Discharge: 2016-08-04 | DRG: 292 | Disposition: A | Discharge status: Home / Self Care | Payer: Medicare Other | Attending: Internal Medicine | Admitting: Internal Medicine

## 2016-07-27 ENCOUNTER — Encounter (HOSPITAL_COMMUNITY): Payer: Self-pay | Admitting: Emergency Medicine

## 2016-07-27 ENCOUNTER — Emergency Department (HOSPITAL_COMMUNITY): Payer: Medicare Other

## 2016-07-27 DIAGNOSIS — Z9081 Acquired absence of spleen: Secondary | ICD-10-CM

## 2016-07-27 DIAGNOSIS — R609 Edema, unspecified: Secondary | ICD-10-CM | POA: Diagnosis present

## 2016-07-27 DIAGNOSIS — J440 Chronic obstructive pulmonary disease with acute lower respiratory infection: Secondary | ICD-10-CM | POA: Diagnosis present

## 2016-07-27 DIAGNOSIS — R05 Cough: Secondary | ICD-10-CM | POA: Diagnosis not present

## 2016-07-27 DIAGNOSIS — E118 Type 2 diabetes mellitus with unspecified complications: Secondary | ICD-10-CM

## 2016-07-27 DIAGNOSIS — J9611 Chronic respiratory failure with hypoxia: Secondary | ICD-10-CM | POA: Diagnosis not present

## 2016-07-27 DIAGNOSIS — J441 Chronic obstructive pulmonary disease with (acute) exacerbation: Secondary | ICD-10-CM | POA: Diagnosis present

## 2016-07-27 DIAGNOSIS — F17210 Nicotine dependence, cigarettes, uncomplicated: Secondary | ICD-10-CM | POA: Diagnosis not present

## 2016-07-27 DIAGNOSIS — I509 Heart failure, unspecified: Secondary | ICD-10-CM | POA: Diagnosis not present

## 2016-07-27 DIAGNOSIS — I248 Other forms of acute ischemic heart disease: Secondary | ICD-10-CM | POA: Diagnosis present

## 2016-07-27 DIAGNOSIS — I471 Supraventricular tachycardia: Secondary | ICD-10-CM | POA: Diagnosis not present

## 2016-07-27 DIAGNOSIS — Z6836 Body mass index (BMI) 36.0-36.9, adult: Secondary | ICD-10-CM

## 2016-07-27 DIAGNOSIS — I4581 Long QT syndrome: Secondary | ICD-10-CM | POA: Diagnosis present

## 2016-07-27 DIAGNOSIS — G8929 Other chronic pain: Secondary | ICD-10-CM | POA: Diagnosis present

## 2016-07-27 DIAGNOSIS — J209 Acute bronchitis, unspecified: Secondary | ICD-10-CM | POA: Diagnosis present

## 2016-07-27 DIAGNOSIS — Z7901 Long term (current) use of anticoagulants: Secondary | ICD-10-CM

## 2016-07-27 DIAGNOSIS — I2510 Atherosclerotic heart disease of native coronary artery without angina pectoris: Secondary | ICD-10-CM | POA: Diagnosis present

## 2016-07-27 DIAGNOSIS — Z9981 Dependence on supplemental oxygen: Secondary | ICD-10-CM

## 2016-07-27 DIAGNOSIS — R0602 Shortness of breath: Secondary | ICD-10-CM | POA: Diagnosis not present

## 2016-07-27 DIAGNOSIS — E1165 Type 2 diabetes mellitus with hyperglycemia: Secondary | ICD-10-CM | POA: Diagnosis present

## 2016-07-27 DIAGNOSIS — Z794 Long term (current) use of insulin: Secondary | ICD-10-CM

## 2016-07-27 DIAGNOSIS — Z8249 Family history of ischemic heart disease and other diseases of the circulatory system: Secondary | ICD-10-CM

## 2016-07-27 DIAGNOSIS — I10 Essential (primary) hypertension: Secondary | ICD-10-CM | POA: Diagnosis present

## 2016-07-27 DIAGNOSIS — I5023 Acute on chronic systolic (congestive) heart failure: Secondary | ICD-10-CM | POA: Diagnosis present

## 2016-07-27 DIAGNOSIS — IMO0002 Reserved for concepts with insufficient information to code with codable children: Secondary | ICD-10-CM | POA: Diagnosis present

## 2016-07-27 DIAGNOSIS — Z79891 Long term (current) use of opiate analgesic: Secondary | ICD-10-CM

## 2016-07-27 DIAGNOSIS — Z7951 Long term (current) use of inhaled steroids: Secondary | ICD-10-CM

## 2016-07-27 DIAGNOSIS — I5022 Chronic systolic (congestive) heart failure: Secondary | ICD-10-CM

## 2016-07-27 DIAGNOSIS — I4892 Unspecified atrial flutter: Secondary | ICD-10-CM | POA: Diagnosis present

## 2016-07-27 DIAGNOSIS — I110 Hypertensive heart disease with heart failure: Secondary | ICD-10-CM | POA: Diagnosis not present

## 2016-07-27 DIAGNOSIS — E1169 Type 2 diabetes mellitus with other specified complication: Secondary | ICD-10-CM | POA: Diagnosis present

## 2016-07-27 DIAGNOSIS — I480 Paroxysmal atrial fibrillation: Secondary | ICD-10-CM | POA: Diagnosis present

## 2016-07-27 DIAGNOSIS — Z79899 Other long term (current) drug therapy: Secondary | ICD-10-CM

## 2016-07-27 DIAGNOSIS — Z66 Do not resuscitate: Secondary | ICD-10-CM | POA: Diagnosis present

## 2016-07-27 DIAGNOSIS — E1140 Type 2 diabetes mellitus with diabetic neuropathy, unspecified: Secondary | ICD-10-CM | POA: Diagnosis present

## 2016-07-27 DIAGNOSIS — I428 Other cardiomyopathies: Secondary | ICD-10-CM

## 2016-07-27 DIAGNOSIS — E785 Hyperlipidemia, unspecified: Secondary | ICD-10-CM | POA: Diagnosis present

## 2016-07-27 LAB — BLOOD GAS, VENOUS
ACID-BASE EXCESS: 9.1 mmol/L — AB (ref 0.0–2.0)
BICARBONATE: 35.5 mmol/L — AB (ref 20.0–28.0)
O2 Content: 2 L/min
O2 SAT: 93.4 %
PCO2 VEN: 59.4 mmHg (ref 44.0–60.0)
PH VEN: 7.393 (ref 7.250–7.430)
Patient temperature: 98.3
pO2, Ven: 69.2 mmHg — ABNORMAL HIGH (ref 32.0–45.0)

## 2016-07-27 LAB — CBC WITH DIFFERENTIAL/PLATELET
BASOS PCT: 0 %
Basophils Absolute: 0 10*3/uL (ref 0.0–0.1)
EOS ABS: 0.1 10*3/uL (ref 0.0–0.7)
EOS PCT: 1 %
HCT: 38.1 % — ABNORMAL LOW (ref 39.0–52.0)
HEMOGLOBIN: 11.7 g/dL — AB (ref 13.0–17.0)
Lymphocytes Relative: 18 %
Lymphs Abs: 1.8 10*3/uL (ref 0.7–4.0)
MCH: 26.8 pg (ref 26.0–34.0)
MCHC: 30.7 g/dL (ref 30.0–36.0)
MCV: 87.2 fL (ref 78.0–100.0)
Monocytes Absolute: 1 10*3/uL (ref 0.1–1.0)
Monocytes Relative: 10 %
NEUTROS PCT: 71 %
Neutro Abs: 7 10*3/uL (ref 1.7–7.7)
PLATELETS: 358 10*3/uL (ref 150–400)
RBC: 4.37 MIL/uL (ref 4.22–5.81)
RDW: 17.8 % — ABNORMAL HIGH (ref 11.5–15.5)
WBC: 9.9 10*3/uL (ref 4.0–10.5)

## 2016-07-27 LAB — BASIC METABOLIC PANEL
Anion gap: 8 (ref 5–15)
BUN: 12 mg/dL (ref 6–20)
CHLORIDE: 98 mmol/L — AB (ref 101–111)
CO2: 35 mmol/L — ABNORMAL HIGH (ref 22–32)
CREATININE: 0.82 mg/dL (ref 0.61–1.24)
Calcium: 8.5 mg/dL — ABNORMAL LOW (ref 8.9–10.3)
Glucose, Bld: 278 mg/dL — ABNORMAL HIGH (ref 65–99)
POTASSIUM: 4 mmol/L (ref 3.5–5.1)
SODIUM: 141 mmol/L (ref 135–145)

## 2016-07-27 LAB — I-STAT TROPONIN, ED: TROPONIN I, POC: 0.02 ng/mL (ref 0.00–0.08)

## 2016-07-27 MED ORDER — LEVALBUTEROL HCL 0.63 MG/3ML IN NEBU
0.6300 mg | INHALATION_SOLUTION | Freq: Once | RESPIRATORY_TRACT | Status: AC
  Administered 2016-07-27: 0.63 mg via RESPIRATORY_TRACT
  Filled 2016-07-27: qty 3

## 2016-07-27 MED ORDER — ALBUTEROL SULFATE (2.5 MG/3ML) 0.083% IN NEBU
5.0000 mg | INHALATION_SOLUTION | Freq: Once | RESPIRATORY_TRACT | Status: AC
  Administered 2016-07-27: 5 mg via RESPIRATORY_TRACT
  Filled 2016-07-27: qty 6

## 2016-07-27 MED ORDER — FUROSEMIDE 10 MG/ML IJ SOLN
60.0000 mg | Freq: Once | INTRAMUSCULAR | Status: AC
  Administered 2016-07-27: 60 mg via INTRAVENOUS
  Filled 2016-07-27: qty 8

## 2016-07-27 NOTE — ED Notes (Signed)
Pt stated "it's time for my methadone.  I take it @ 2300."  PA-C informed.

## 2016-07-27 NOTE — ED Notes (Signed)
RT called to administer tx.

## 2016-07-27 NOTE — ED Notes (Signed)
Pt remains on monitor.

## 2016-07-27 NOTE — ED Notes (Signed)
Pt with eyes closed, remains on monitor, NAD.

## 2016-07-27 NOTE — ED Notes (Signed)
Pt remains on monitor, breathing tx completed, PA-C in to room.

## 2016-07-27 NOTE — ED Provider Notes (Signed)
[facility] DEPT Provider Note   CSN: [id number] Arrival date & time: 07/27/16  2105     History   Chief Complaint Chief Complaint  Patient presents with  . Shortness of Breath    HPI Nicholas Caldwell is a 70 y.o. male.  Patient with non-ischemic cardiomyopathy, Chronic Systolic CHF EF 13-24%,  A-fib on Xarelto,Prolonged Q-T interval , HTN, DM2, HLD, COPD on 2 L O2 at home, presents to the ED with a chief complaint of SOB.  Patient states that he began feeling short of breath tonight at 7pm.  He reports increased swelling of lower extremities and abdomen.  He states that he has had some productive cough.  He denies any fevers, but states he has had some chills at home.  He denies any chest pain, nausea, vomiting, or diarrhea.  He was recently admitted for dyspnea and hypercarbia.  He is [facility] by the [facility].   The history is provided by the patient. No language interpreter was used.    Past Medical History:  Diagnosis Date  . Atrial flutter ([place])    a. recurrent AFlutter with RVR;  b. Amiodarone Rx started 4/16  . CAD (coronary artery disease)    a. LHC 1/16:  mLAD diffuse disease, pLCx mild disease, dLCx with disease but too small for PCI, RCA ok, EF 25-30%  . Chronic pain   . Chronic systolic CHF (congestive heart failure) ([place])   . COPD (chronic obstructive pulmonary disease) ([place])   . Diabetes mellitus without complication ([place])   . Hypercholesteremia   . Hypertension   . NICM (nonischemic cardiomyopathy) ([place])    a.dx 2016. b. 2D echo 06/2016 - Last echo 07/01/16: mod dilated LV, mod LVH, EF 25-30%, mild-mod MR, sev LAE, mild-mod reduced RV systolic function, mild-mod TR, PASP 56mHG.  .Marland KitchenPAF (paroxysmal atrial fibrillation) (HCC)    On amio - ot a candidate for flecainide due to cardiomyopathy, not a candidate for Tikosyn due to prolonged QT, and felt to be a poor candidate for ablation given left atrial size.  . Prolonged Q-T interval on ECG   . Pulmonary  hypertension ([place]   . Tobacco abuse     Patient Active Problem List   Diagnosis Date Noted  . Chronic systolic CHF (congestive heart failure) ([facility] 07/16/2016  . Paroxysmal atrial flutter ([place] 07/16/2016  . COPD (chronic obstructive pulmonary disease) ([facility] 07/16/2016  . Uncontrolled type 2 diabetes mellitus with complication ([place]   . Diabetic polyneuropathy associated with diabetes mellitus due to underlying condition ([facility]   . Normocytic anemia 06/29/2016  . CAD (coronary artery disease) - Non-obstructive by LHC1/16 01/21/2016  . Prolonged QT interval 10/09/2015  . Nonischemic cardiomyopathy ([facility] 10/09/2015  . PAF (paroxysmal atrial fibrillation) ([place]   . Acute respiratory failure with hypoxia and hypercarbia ([facility] 02/19/2015  . Chronic pain 02/19/2015  . Morbid obesity ([facility] 02/13/2015  . Acute on chronic systolic CHF (congestive heart failure) ([facility] 11/02/2014  . Elevated troponin I level 11/01/2014  . COPD exacerbation ([place] 11/01/2014  . Essential hypertension 10/31/2014  . Type 2 diabetes mellitus with diabetic neuropathy, with long-term current use of insulin ([facility] 10/31/2014  . Hyperlipidemia associated with type 2 diabetes mellitus ([facility] 10/31/2014  . Smoker 10/31/2014    Past Surgical History:  Procedure Laterality Date  . LEFT AND RIGHT HEART CATHETERIZATION WITH CORONARY ANGIOGRAM N/A 11/06/2014   Procedure: LEFT AND RIGHT HEART CATHETERIZATION WITH CORONARY ANGIOGRAM;  Surgeon: JJettie Booze MD;  [facility];  Service: Cardiovascular;  Laterality: N/A;  . SPLENECTOMY         Home Medications    Prior to Admission medications   Medication Sig Start Date End Date Taking? Authorizing Provider  albuterol (PROVENTIL HFA;VENTOLIN HFA) 108 (90 BASE) MCG/ACT inhaler Inhale 2 puffs into the lungs every 6 (six) hours as needed for wheezing or shortness of breath. 11/07/14   Kinnie Feil, MD  budesonide-formoterol (SYMBICORT) 160-4.5 MCG/ACT inhaler  Inhale 2 puffs into the lungs 2 (two) times daily. Rinse mouth well with water after each use    Historical Provider, MD  Carboxymethylcellulose Sodium 0.25 % SOLN Place 1 drop into both eyes 4 (four) times daily.    Historical Provider, MD  carvedilol (COREG) 25 MG tablet Take 25 mg by mouth 2 (two) times daily with a meal.    Historical Provider, MD  diazepam (VALIUM) 10 MG tablet Take 0.5 tablets (5 mg total) by mouth daily as needed for anxiety or sleep. 07/04/16   Belkys A Regalado, MD  diclofenac sodium (VOLTAREN) 1 % GEL Apply 2 g topically 2 (two) times daily as needed (for pain).    Historical Provider, MD  digoxin (LANOXIN) 0.125 MG tablet Take 1 tablet (0.125 mg total) by mouth daily. 09/12/15   Liliane Shi, PA-C  fluticasone (FLONASE) 50 MCG/ACT nasal spray Place 2 sprays into both nostrils daily.    Historical Provider, MD  furosemide (LASIX) 40 MG tablet Take 80 mg by mouth 3 (three) times daily.    Historical Provider, MD  gabapentin (NEURONTIN) 600 MG tablet Take 1-3 tablets (600-1,800 mg total) by mouth See admin instructions. Take 1 tablet (600 mg) by mouth daily with lunch,3 tablets (1800 mg) at bedtime 07/04/16   Belkys A Regalado, MD  glipiZIDE (GLUCOTROL) 10 MG tablet Take 10 mg by mouth 2 (two) times daily before a meal.     Historical Provider, MD  glucose 4 GM chewable tablet Chew 4 tablets by mouth See admin instructions. Chew 4 tablets by mouth as needed for low blood sugar - repeat every 15 minutes if blood sugar less than 70    Historical Provider, MD  hydrALAZINE (APRESOLINE) 25 MG tablet Take 1 tablet (25 mg total) by mouth 3 (three) times daily. 09/12/15   Liliane Shi, PA-C  hydrocortisone (ANUSOL-HC) 2.5 % rectal cream Place 1 application rectally 2 (two) times daily as needed for hemorrhoids or itching. Use up to 2 weeks at a time as needed    Historical Provider, MD  hydrocortisone 1 % lotion Apply 1 application topically See admin instructions. Apply small amount to  back twice daily for itchy rash    Historical Provider, MD  insulin detemir (LEVEMIR) 100 UNIT/ML injection Inject 0.24 mLs (24 Units total) into the skin every 12 (twelve) hours. 07/04/16   Belkys A Regalado, MD  isosorbide mononitrate (IMDUR) 30 MG 24 hr tablet Take 1 tablet (30 mg total) by mouth daily. 10/09/15   Jettie Booze, MD  metFORMIN (GLUCOPHAGE-XR) 500 MG 24 hr tablet Take 1,000 mg by mouth 2 (two) times daily with a meal.    Historical Provider, MD  methadone (DOLOPHINE) 10 MG tablet Take 1 tablet (10 mg total) by mouth every 8 (eight) hours. 07/04/16   Belkys A Regalado, MD  potassium chloride SA (K-DUR,KLOR-CON) 20 MEQ tablet Take 2 tablets (40 mEq total) by mouth daily. 11/07/14   Kinnie Feil, MD  rivaroxaban (XARELTO) 20 MG TABS tablet Take 1 tablet (20 mg  total) by mouth daily with supper. 02/23/15   Luan Moore, MD  rosuvastatin (CRESTOR) 40 MG tablet Take 20 mg by mouth daily. For cholesterol    Historical Provider, MD  sacubitril-valsartan (ENTRESTO) 49-51 MG Take 1 tablet by mouth 2 (two) times daily. 09/12/15   Liliane Shi, PA-C  spironolactone (ALDACTONE) 25 MG tablet Take 1 tablet (25 mg total) by mouth daily. 08/02/15   Liliane Shi, PA-C    Family History Family History  Problem Relation Age of Onset  . Heart disease Mother   . Hypertension Mother   . Heart failure Mother   . Heart disease Father   . Hypertension Sister   . Heart attack Neg Hx   . Stroke Neg Hx     Social History Social History  Substance Use Topics  . Smoking status: Current Every Day Smoker    Packs/day: 0.05    Types: Cigarettes    Last attempt to quit: 08/30/2015  . Smokeless tobacco: Never Used  . Alcohol use No     Allergies   Review of patient's allergies indicates no known allergies.   Review of Systems Review of Systems  Constitutional: Positive for chills. Negative for fever.  Respiratory: Positive for cough, shortness of breath and wheezing.   Cardiovascular:  Positive for leg swelling.  All other systems reviewed and are negative.    Physical Exam Updated Vital Signs BP 153/71 (BP Location: Left Arm)   Pulse 114   Temp 98.3 F (36.8 C) (Oral)   Resp 20   SpO2 94%   Physical Exam  Constitutional: He is oriented to person, place, and time. He appears well-developed and well-nourished.  HENT:  Head: Normocephalic and atraumatic.  Eyes: Conjunctivae and EOM are normal. Pupils are equal, round, and reactive to light. Right eye exhibits no discharge. Left eye exhibits no discharge. No scleral icterus.  Neck: Normal range of motion. Neck supple. No JVD present.  Cardiovascular: Normal rate and normal heart sounds.  Exam reveals no gallop and no friction rub.   No murmur heard. Irregularly irregular  Pulmonary/Chest: Effort normal. No respiratory distress. He has rales. He exhibits no tenderness.  Abdominal: Soft. He exhibits no distension and no mass. There is no tenderness. There is no rebound and no guarding.  Musculoskeletal: Normal range of motion. He exhibits edema. He exhibits no tenderness.  Lower extremity edema  Neurological: He is alert and oriented to person, place, and time.  Skin: Skin is warm and dry.  Psychiatric: He has a normal mood and affect. His behavior is normal. Judgment and thought content normal.  Nursing note and vitals reviewed.    ED Treatments / Results  Labs (all labs ordered are listed, but only abnormal results are displayed) Labs Reviewed  CBC WITH DIFFERENTIAL/PLATELET  BASIC METABOLIC PANEL  I-STAT [place], ED    EKG  EKG Interpretation  Date/Time:  Sunday July 27 2016 21:10:38 EDT Ventricular Rate:  132 PR Interval:    QRS Duration: 107 QT Interval:  344 QTC Calculation: 510 R Axis:   84 Text Interpretation:  Atrial fibrillation Borderline right axis deviation Nonspecific repol abnormality, lateral leads Prolonged QT interval Confirmed by Hazle Coca [phone]) on 07/27/2016 9:47:04 PM         Radiology Dg Chest 2 View  Result Date: 07/27/2016 CLINICAL DATA:  Acute onset of shortness of breath and productive cough. Mild wheezing. Initial encounter. EXAM: CHEST  2 VIEW COMPARISON:  Chest radiograph performed 06/29/2016 FINDINGS: Vascular congestion is  noted. Increased interstitial markings raise concern for pulmonary edema. No definite pleural effusion or pneumothorax is seen. The heart is enlarged. No acute osseous abnormalities are identified. IMPRESSION: Vascular congestion and cardiomegaly. Increased interstitial markings raise concern for pulmonary edema. Electronically Signed   By: Garald Balding M.D.   On: 07/27/2016 22:18    Procedures Procedures (including critical care time)  Medications Ordered in ED Medications  albuterol (PROVENTIL) (2.5 MG/3ML) 0.083% nebulizer solution 5 mg (5 mg Nebulization Given 07/27/16 2137)     Initial Impression / Assessment and Plan / ED Course  I have reviewed the triage vital signs and the nursing notes.  Pertinent labs & imaging results that were available during my care of the patient were reviewed by me and considered in my medical decision making (see chart for details).  Clinical Course    Patient with dyspnea, onset tonight, recent admission for the same with hypercarbia.  Will check VBG.  Not in extremis.  Seen by and discussed with Dr. Ralene Bathe, who recommends lasix and admission. Will wait for VBG prior to starting BiPAP.    PCO2 is 59.  Not hypercarbic.  Could be mixed COPD/CHF exacerbation.  Patient felt better after breathing treatment.   Appreciate Dr. Roel Cluck for admitting the patient.   Final Clinical Impressions(s) / ED Diagnoses   Final diagnoses:  Acute on chronic congestive heart failure, unspecified congestive heart failure type [facility])    New Prescriptions New Prescriptions   No medications on file     Montine Circle, PA-C 07/28/16 0025    Quintella Reichert, MD 07/29/16 [phone]

## 2016-07-27 NOTE — ED Notes (Signed)
Patient transported to X-ray 

## 2016-07-27 NOTE — ED Triage Notes (Signed)
Pt from home with complaints of SOB that began tonight around 1900. Pt states he was just sitting watching tv when he began feeing these symptoms. Pt states he has felt febrile over the past few days, but has not checked a temperature. Pt states he normally uses 2L of oxygen at home for his COPD, but he does not have a small tank to take with him when he leaves the house. Pt states he has had a productive cough with thick yellow sputum. Pt has very mild lower lobe wheezing at time of assessment

## 2016-07-28 ENCOUNTER — Encounter: Payer: Self-pay | Admitting: Physician Assistant

## 2016-07-28 ENCOUNTER — Encounter (HOSPITAL_COMMUNITY): Payer: Self-pay | Admitting: Internal Medicine

## 2016-07-28 DIAGNOSIS — J9611 Chronic respiratory failure with hypoxia: Secondary | ICD-10-CM | POA: Diagnosis present

## 2016-07-28 DIAGNOSIS — E118 Type 2 diabetes mellitus with unspecified complications: Secondary | ICD-10-CM

## 2016-07-28 DIAGNOSIS — I480 Paroxysmal atrial fibrillation: Secondary | ICD-10-CM

## 2016-07-28 DIAGNOSIS — E1140 Type 2 diabetes mellitus with diabetic neuropathy, unspecified: Secondary | ICD-10-CM | POA: Diagnosis present

## 2016-07-28 DIAGNOSIS — I483 Typical atrial flutter: Secondary | ICD-10-CM | POA: Diagnosis not present

## 2016-07-28 DIAGNOSIS — I10 Essential (primary) hypertension: Secondary | ICD-10-CM

## 2016-07-28 DIAGNOSIS — G8929 Other chronic pain: Secondary | ICD-10-CM

## 2016-07-28 DIAGNOSIS — J209 Acute bronchitis, unspecified: Secondary | ICD-10-CM | POA: Diagnosis present

## 2016-07-28 DIAGNOSIS — I481 Persistent atrial fibrillation: Secondary | ICD-10-CM | POA: Diagnosis not present

## 2016-07-28 DIAGNOSIS — Z66 Do not resuscitate: Secondary | ICD-10-CM | POA: Diagnosis present

## 2016-07-28 DIAGNOSIS — I248 Other forms of acute ischemic heart disease: Secondary | ICD-10-CM | POA: Diagnosis present

## 2016-07-28 DIAGNOSIS — E1169 Type 2 diabetes mellitus with other specified complication: Secondary | ICD-10-CM

## 2016-07-28 DIAGNOSIS — Z9981 Dependence on supplemental oxygen: Secondary | ICD-10-CM | POA: Diagnosis not present

## 2016-07-28 DIAGNOSIS — I509 Heart failure, unspecified: Secondary | ICD-10-CM | POA: Insufficient documentation

## 2016-07-28 DIAGNOSIS — Z794 Long term (current) use of insulin: Secondary | ICD-10-CM

## 2016-07-28 DIAGNOSIS — I5023 Acute on chronic systolic (congestive) heart failure: Secondary | ICD-10-CM

## 2016-07-28 DIAGNOSIS — F17210 Nicotine dependence, cigarettes, uncomplicated: Secondary | ICD-10-CM | POA: Diagnosis present

## 2016-07-28 DIAGNOSIS — I4581 Long QT syndrome: Secondary | ICD-10-CM

## 2016-07-28 DIAGNOSIS — I428 Other cardiomyopathies: Secondary | ICD-10-CM | POA: Diagnosis present

## 2016-07-28 DIAGNOSIS — I4892 Unspecified atrial flutter: Secondary | ICD-10-CM | POA: Diagnosis not present

## 2016-07-28 DIAGNOSIS — I110 Hypertensive heart disease with heart failure: Secondary | ICD-10-CM | POA: Diagnosis present

## 2016-07-28 DIAGNOSIS — J441 Chronic obstructive pulmonary disease with (acute) exacerbation: Secondary | ICD-10-CM

## 2016-07-28 DIAGNOSIS — R05 Cough: Secondary | ICD-10-CM | POA: Diagnosis present

## 2016-07-28 DIAGNOSIS — E785 Hyperlipidemia, unspecified: Secondary | ICD-10-CM

## 2016-07-28 DIAGNOSIS — R0602 Shortness of breath: Secondary | ICD-10-CM | POA: Diagnosis not present

## 2016-07-28 DIAGNOSIS — I471 Supraventricular tachycardia: Secondary | ICD-10-CM | POA: Diagnosis not present

## 2016-07-28 DIAGNOSIS — E1165 Type 2 diabetes mellitus with hyperglycemia: Secondary | ICD-10-CM

## 2016-07-28 DIAGNOSIS — R609 Edema, unspecified: Secondary | ICD-10-CM | POA: Diagnosis present

## 2016-07-28 DIAGNOSIS — I2510 Atherosclerotic heart disease of native coronary artery without angina pectoris: Secondary | ICD-10-CM | POA: Diagnosis not present

## 2016-07-28 DIAGNOSIS — J440 Chronic obstructive pulmonary disease with acute lower respiratory infection: Secondary | ICD-10-CM | POA: Diagnosis present

## 2016-07-28 DIAGNOSIS — Z6836 Body mass index (BMI) 36.0-36.9, adult: Secondary | ICD-10-CM | POA: Diagnosis not present

## 2016-07-28 LAB — COMPREHENSIVE METABOLIC PANEL
ALBUMIN: 3.4 g/dL — AB (ref 3.5–5.0)
ALT: 22 U/L (ref 17–63)
AST: 15 U/L (ref 15–41)
Alkaline Phosphatase: 108 U/L (ref 38–126)
Anion gap: 9 (ref 5–15)
BUN: 12 mg/dL (ref 6–20)
CHLORIDE: 96 mmol/L — AB (ref 101–111)
CO2: 37 mmol/L — ABNORMAL HIGH (ref 22–32)
Calcium: 8.8 mg/dL — ABNORMAL LOW (ref 8.9–10.3)
Creatinine, Ser: 0.82 mg/dL (ref 0.61–1.24)
GFR calc Af Amer: >60 mL/min (ref >60)
GFR calc non Af Amer: >60 mL/min (ref >60)
GLUCOSE: 230 mg/dL — AB (ref 65–99)
POTASSIUM: 3.8 mmol/L (ref 3.5–5.1)
Sodium: 142 mmol/L (ref 135–145)
Total Bilirubin: 0.4 mg/dL (ref 0.3–1.2)
Total Protein: 7.3 g/dL (ref 6.5–8.1)

## 2016-07-28 LAB — CBC
HCT: 39.3 % (ref 39.0–52.0)
Hemoglobin: 11.9 g/dL — ABNORMAL LOW (ref 13.0–17.0)
MCH: 26.4 pg (ref 26.0–34.0)
MCHC: 30.3 g/dL (ref 30.0–36.0)
MCV: 87.3 fL (ref 78.0–100.0)
PLATELETS: 372 10*3/uL (ref 150–400)
RBC: 4.5 MIL/uL (ref 4.22–5.81)
RDW: 17.7 % — AB (ref 11.5–15.5)
WBC: 9.5 10*3/uL (ref 4.0–10.5)

## 2016-07-28 LAB — TROPONIN I
TROPONIN I: 0.04 ng/mL — AB (ref <0.03)
TROPONIN I: 0.04 ng/mL — AB (ref <0.03)
Troponin I: 0.04 ng/mL (ref <0.03)

## 2016-07-28 LAB — MAGNESIUM: MAGNESIUM: 1.8 mg/dL (ref 1.7–2.4)

## 2016-07-28 LAB — BRAIN NATRIURETIC PEPTIDE: B Natriuretic Peptide: 414 pg/mL — ABNORMAL HIGH (ref 0.0–100.0)

## 2016-07-28 LAB — GLUCOSE, CAPILLARY
Glucose-Capillary: 309 mg/dL — ABNORMAL HIGH (ref 65–99)
Glucose-Capillary: 193 mg/dL — ABNORMAL HIGH (ref 65–99)
Glucose-Capillary: 168 mg/dL — ABNORMAL HIGH (ref 65–99)

## 2016-07-28 LAB — PHOSPHORUS: PHOSPHORUS: 4.3 mg/dL (ref 2.5–4.6)

## 2016-07-28 LAB — TSH: TSH: 1.745 u[IU]/mL (ref 0.350–4.500)

## 2016-07-28 LAB — DIGOXIN LEVEL

## 2016-07-28 LAB — MRSA PCR SCREENING: MRSA by PCR: NEGATIVE

## 2016-07-28 IMAGING — CR DG CHEST 2V
2 series · 2 of 2 positions shown · non-contrast
Comparison: 02/20/2015

CLINICAL DATA: Shortness of breath for 5 days

EXAM:
CHEST  2 VIEW

[chest pa]
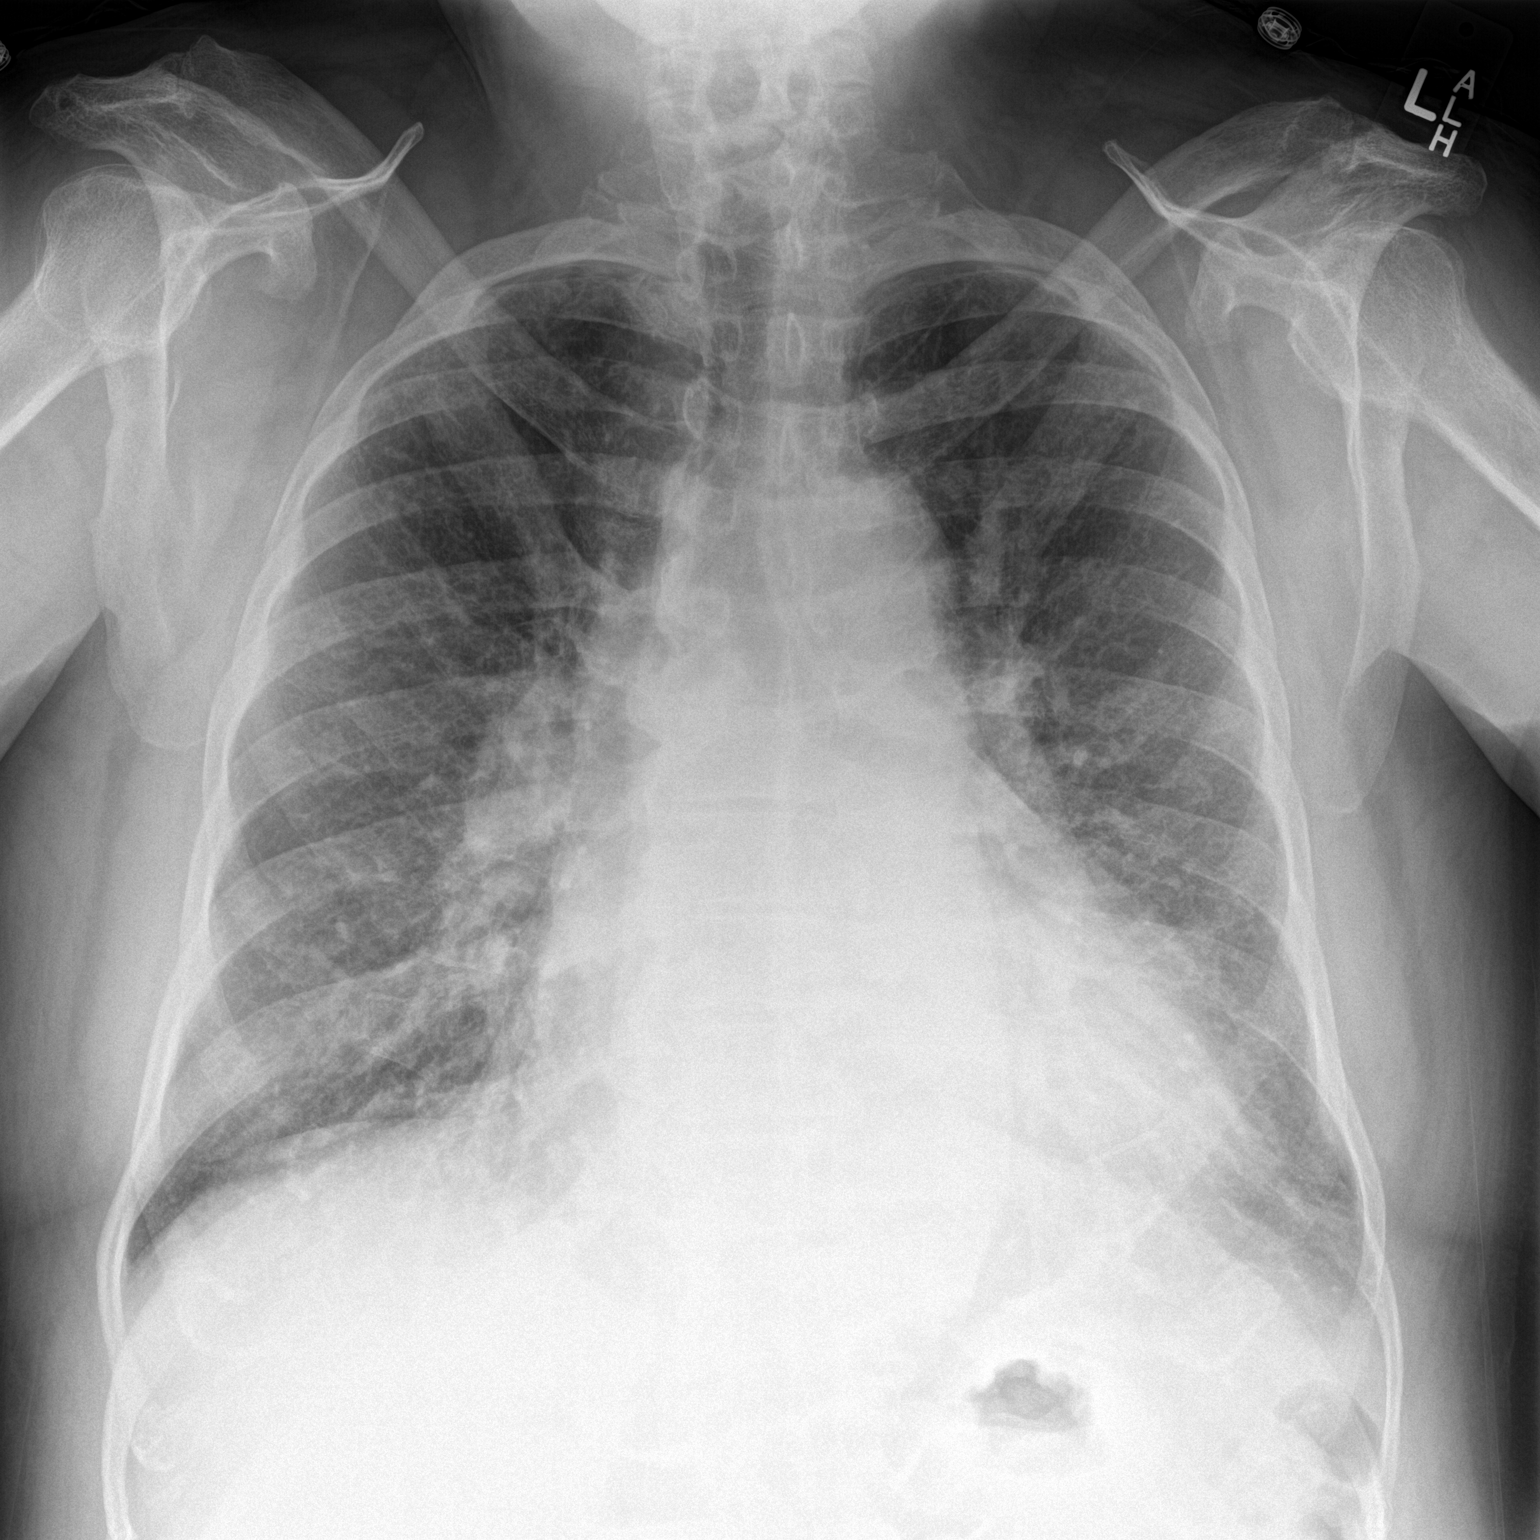

[chest lat]
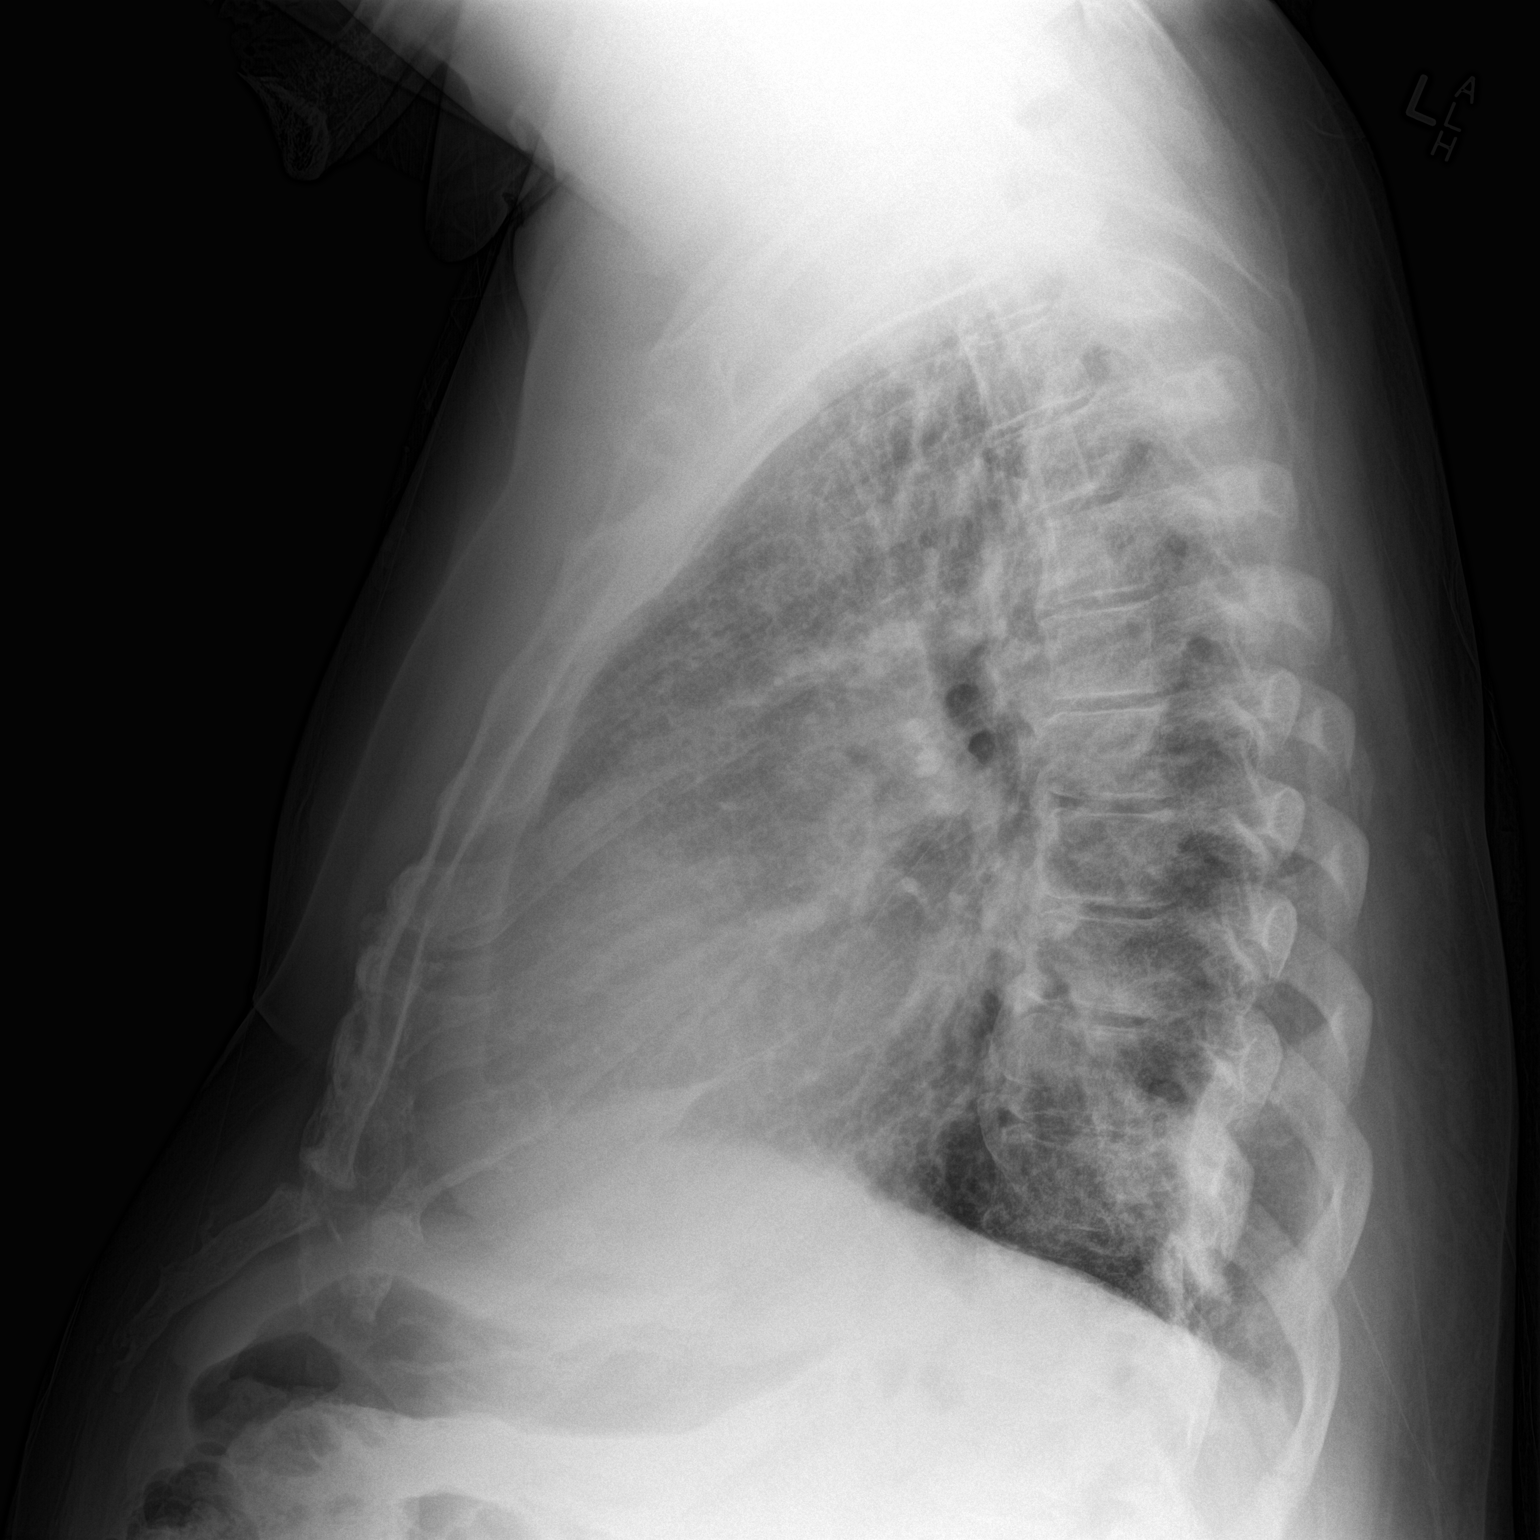

[2 of 2 positions shown; findings below may reference images not displayed]

FINDINGS: Moderate enlargement of the cardiac silhouette is reidentified with
central vascular congestion. Prominence of the interstitial markings
is re- demonstrated. Trace pleural fluid is identified. No acute
osseous finding.
IMPRESSION: Stable moderate cardiomegaly and minimal interstitial prominence
suggesting early edema.

## 2016-07-28 MED ORDER — GLIPIZIDE 10 MG PO TABS
10.0000 mg | ORAL_TABLET | Freq: Two times a day (BID) | ORAL | Status: DC
  Administered 2016-07-28 – 2016-08-04 (×14): 10 mg via ORAL
  Filled 2016-07-28 (×13): qty 1

## 2016-07-28 MED ORDER — GABAPENTIN 300 MG PO CAPS
1800.0000 mg | ORAL_CAPSULE | Freq: Every day | ORAL | Status: DC
  Administered 2016-07-28 – 2016-08-03 (×7): 1800 mg via ORAL
  Filled 2016-07-28 (×7): qty 6

## 2016-07-28 MED ORDER — SODIUM CHLORIDE 0.9% FLUSH
3.0000 mL | Freq: Two times a day (BID) | INTRAVENOUS | Status: DC
  Administered 2016-07-28 – 2016-08-04 (×14): 3 mL via INTRAVENOUS

## 2016-07-28 MED ORDER — ROSUVASTATIN CALCIUM 20 MG PO TABS
20.0000 mg | ORAL_TABLET | Freq: Every day | ORAL | Status: DC
  Administered 2016-07-28 – 2016-08-04 (×8): 20 mg via ORAL
  Filled 2016-07-28 (×8): qty 1

## 2016-07-28 MED ORDER — ONDANSETRON HCL 4 MG PO TABS: 4.0000 mg | ORAL_TABLET | Freq: Four times a day (QID) | ORAL | Status: DC | PRN

## 2016-07-28 MED ORDER — FUROSEMIDE 10 MG/ML IJ SOLN
60.0000 mg | Freq: Two times a day (BID) | INTRAMUSCULAR | Status: DC
  Administered 2016-07-28 – 2016-07-29 (×4): 60 mg via INTRAVENOUS
  Filled 2016-07-28 (×4): qty 6

## 2016-07-28 MED ORDER — INSULIN DETEMIR 100 UNIT/ML ~~LOC~~ SOLN
24.0000 [IU] | Freq: Every evening | SUBCUTANEOUS | Status: DC
  Administered 2016-07-28 – 2016-08-02 (×7): 24 [IU] via SUBCUTANEOUS
  Filled 2016-07-28 (×7): qty 0.24

## 2016-07-28 MED ORDER — GABAPENTIN 300 MG PO CAPS
600.0000 mg | ORAL_CAPSULE | Freq: Every day | ORAL | Status: DC
  Administered 2016-07-28 – 2016-08-04 (×8): 600 mg via ORAL
  Filled 2016-07-28 (×8): qty 2

## 2016-07-28 MED ORDER — MOMETASONE FURO-FORMOTEROL FUM 200-5 MCG/ACT IN AERO
2.0000 | INHALATION_SPRAY | Freq: Two times a day (BID) | RESPIRATORY_TRACT | Status: DC
  Administered 2016-07-28 – 2016-08-04 (×13): 2 via RESPIRATORY_TRACT
  Filled 2016-07-28: qty 8.8

## 2016-07-28 MED ORDER — ONDANSETRON HCL 4 MG/2ML IJ SOLN: 4.0000 mg | Freq: Four times a day (QID) | INTRAMUSCULAR | Status: DC | PRN

## 2016-07-28 MED ORDER — LEVALBUTEROL HCL 0.63 MG/3ML IN NEBU
0.6300 mg | INHALATION_SOLUTION | Freq: Three times a day (TID) | RESPIRATORY_TRACT | Status: DC
  Administered 2016-07-28 – 2016-08-04 (×21): 0.63 mg via RESPIRATORY_TRACT
  Filled 2016-07-28 (×22): qty 3

## 2016-07-28 MED ORDER — INSULIN ASPART 100 UNIT/ML ~~LOC~~ SOLN
0.0000 [IU] | Freq: Three times a day (TID) | SUBCUTANEOUS | Status: DC
  Administered 2016-07-28: 2 [IU] via SUBCUTANEOUS
  Administered 2016-07-28: 11 [IU] via SUBCUTANEOUS
  Administered 2016-07-28: 3 [IU] via SUBCUTANEOUS
  Administered 2016-07-29: 15 [IU] via SUBCUTANEOUS
  Administered 2016-07-29 (×2): 5 [IU] via SUBCUTANEOUS
  Administered 2016-07-30: 2 [IU] via SUBCUTANEOUS
  Administered 2016-07-30: 11 [IU] via SUBCUTANEOUS
  Administered 2016-07-30: 5 [IU] via SUBCUTANEOUS
  Administered 2016-07-31: 2 [IU] via SUBCUTANEOUS
  Administered 2016-07-31: 3 [IU] via SUBCUTANEOUS
  Administered 2016-07-31: 8 [IU] via SUBCUTANEOUS
  Administered 2016-08-01: 11 [IU] via SUBCUTANEOUS
  Administered 2016-08-01: 5 [IU] via SUBCUTANEOUS
  Administered 2016-08-01 – 2016-08-02 (×2): 3 [IU] via SUBCUTANEOUS
  Administered 2016-08-02: 15 [IU] via SUBCUTANEOUS
  Administered 2016-08-02 – 2016-08-03 (×2): 8 [IU] via SUBCUTANEOUS
  Administered 2016-08-03: 11 [IU] via SUBCUTANEOUS
  Administered 2016-08-03: 8 [IU] via SUBCUTANEOUS
  Administered 2016-08-04: 5 [IU] via SUBCUTANEOUS
  Administered 2016-08-04: 3 [IU] via SUBCUTANEOUS

## 2016-07-28 MED ORDER — SODIUM CHLORIDE 0.9% FLUSH
3.0000 mL | Freq: Two times a day (BID) | INTRAVENOUS | Status: DC
  Administered 2016-07-28 – 2016-08-03 (×11): 3 mL via INTRAVENOUS

## 2016-07-28 MED ORDER — ACETAMINOPHEN 325 MG PO TABS: 650.0000 mg | ORAL_TABLET | Freq: Four times a day (QID) | ORAL | Status: DC | PRN

## 2016-07-28 MED ORDER — CARVEDILOL 25 MG PO TABS
25.0000 mg | ORAL_TABLET | Freq: Two times a day (BID) | ORAL | Status: DC
  Administered 2016-07-28 – 2016-08-04 (×15): 25 mg via ORAL
  Filled 2016-07-28: qty 2
  Filled 2016-07-28 (×3): qty 1
  Filled 2016-07-28: qty 2
  Filled 2016-07-28 (×9): qty 1
  Filled 2016-07-28: qty 2

## 2016-07-28 MED ORDER — HYDROCODONE-ACETAMINOPHEN 5-325 MG PO TABS
1.0000 | ORAL_TABLET | ORAL | Status: DC | PRN
  Administered 2016-07-28 – 2016-08-04 (×30): 2 via ORAL
  Filled 2016-07-28 (×30): qty 2

## 2016-07-28 MED ORDER — ACETAMINOPHEN 650 MG RE SUPP: 650.0000 mg | Freq: Four times a day (QID) | RECTAL | Status: DC | PRN

## 2016-07-28 MED ORDER — ISOSORBIDE MONONITRATE ER 30 MG PO TB24
30.0000 mg | ORAL_TABLET | Freq: Every day | ORAL | Status: DC
  Administered 2016-07-28 – 2016-08-04 (×8): 30 mg via ORAL
  Filled 2016-07-28 (×8): qty 1

## 2016-07-28 MED ORDER — IPRATROPIUM BROMIDE 0.02 % IN SOLN: 0.5000 mg | Freq: Four times a day (QID) | RESPIRATORY_TRACT | Status: DC

## 2016-07-28 MED ORDER — DIAZEPAM 5 MG PO TABS
5.0000 mg | ORAL_TABLET | Freq: Every evening | ORAL | Status: DC | PRN
  Administered 2016-07-28 – 2016-08-03 (×3): 5 mg via ORAL
  Filled 2016-07-28 (×3): qty 1

## 2016-07-28 MED ORDER — GUAIFENESIN ER 600 MG PO TB12
600.0000 mg | ORAL_TABLET | Freq: Two times a day (BID) | ORAL | Status: DC
  Administered 2016-07-28 – 2016-08-04 (×15): 600 mg via ORAL
  Filled 2016-07-28 (×15): qty 1

## 2016-07-28 MED ORDER — HYDRALAZINE HCL 25 MG PO TABS
25.0000 mg | ORAL_TABLET | Freq: Three times a day (TID) | ORAL | Status: DC
  Administered 2016-07-28 – 2016-08-04 (×22): 25 mg via ORAL
  Filled 2016-07-28 (×21): qty 1

## 2016-07-28 MED ORDER — INSULIN ASPART 100 UNIT/ML ~~LOC~~ SOLN
0.0000 [IU] | Freq: Every day | SUBCUTANEOUS | Status: DC
  Administered 2016-07-30: 3 [IU] via SUBCUTANEOUS
  Administered 2016-08-02: 2 [IU] via SUBCUTANEOUS

## 2016-07-28 MED ORDER — RIVAROXABAN 20 MG PO TABS
20.0000 mg | ORAL_TABLET | Freq: Every day | ORAL | Status: DC
Start: 2016-07-28 — End: 2016-08-04
  Administered 2016-07-28 – 2016-08-03 (×8): 20 mg via ORAL
  Filled 2016-07-28 (×8): qty 1

## 2016-07-28 MED ORDER — SACUBITRIL-VALSARTAN 49-51 MG PO TABS
1.0000 | ORAL_TABLET | Freq: Two times a day (BID) | ORAL | Status: DC
  Administered 2016-07-28 – 2016-08-04 (×16): 1 via ORAL
  Filled 2016-07-28 (×16): qty 1

## 2016-07-28 MED ORDER — PREDNISONE 20 MG PO TABS: 40.0000 mg | ORAL_TABLET | Freq: Every day | ORAL | Status: DC

## 2016-07-28 MED ORDER — SPIRONOLACTONE 25 MG PO TABS
25.0000 mg | ORAL_TABLET | Freq: Every day | ORAL | Status: DC
  Administered 2016-07-28 – 2016-08-04 (×8): 25 mg via ORAL
  Filled 2016-07-28 (×8): qty 1

## 2016-07-28 MED ORDER — METHADONE HCL 10 MG PO TABS
10.0000 mg | ORAL_TABLET | Freq: Two times a day (BID) | ORAL | Status: DC
  Administered 2016-07-28 – 2016-08-04 (×16): 10 mg via ORAL
  Filled 2016-07-28 (×16): qty 1

## 2016-07-28 MED ORDER — POTASSIUM CHLORIDE CRYS ER 20 MEQ PO TBCR
40.0000 meq | EXTENDED_RELEASE_TABLET | Freq: Every day | ORAL | Status: DC
  Administered 2016-07-28 – 2016-08-04 (×8): 40 meq via ORAL
  Filled 2016-07-28 (×8): qty 2

## 2016-07-28 MED ORDER — SODIUM CHLORIDE 0.9% FLUSH: 3.0000 mL | INTRAVENOUS | Status: DC | PRN

## 2016-07-28 MED ORDER — PREDNISONE 5 MG PO TABS
30.0000 mg | ORAL_TABLET | Freq: Every day | ORAL | Status: DC
  Administered 2016-07-29 – 2016-08-01 (×4): 30 mg via ORAL
  Filled 2016-07-28: qty 2
  Filled 2016-07-28: qty 1
  Filled 2016-07-28 (×2): qty 2

## 2016-07-28 MED ORDER — IPRATROPIUM BROMIDE 0.02 % IN SOLN
0.5000 mg | Freq: Three times a day (TID) | RESPIRATORY_TRACT | Status: DC
  Administered 2016-07-28 – 2016-08-04 (×21): 0.5 mg via RESPIRATORY_TRACT
  Filled 2016-07-28 (×22): qty 2.5

## 2016-07-28 MED ORDER — SODIUM CHLORIDE 0.9 % IV SOLN: 250.0000 mL | INTRAVENOUS | Status: DC | PRN

## 2016-07-28 MED ORDER — PREDNISONE 20 MG PO TABS
40.0000 mg | ORAL_TABLET | Freq: Two times a day (BID) | ORAL | Status: DC
  Administered 2016-07-28: 20 mg via ORAL
  Administered 2016-07-28: 40 mg via ORAL
  Filled 2016-07-28 (×2): qty 2

## 2016-07-28 MED ORDER — DOXYCYCLINE HYCLATE 100 MG PO TABS
100.0000 mg | ORAL_TABLET | Freq: Two times a day (BID) | ORAL | Status: DC
  Administered 2016-07-28 – 2016-08-02 (×12): 100 mg via ORAL
  Filled 2016-07-28 (×12): qty 1

## 2016-07-28 MED ORDER — DIGOXIN 125 MCG PO TABS
0.1250 mg | ORAL_TABLET | Freq: Every day | ORAL | Status: DC
  Administered 2016-07-28 – 2016-08-04 (×8): 0.125 mg via ORAL
  Filled 2016-07-28 (×8): qty 1

## 2016-07-28 MED ORDER — DILTIAZEM HCL 25 MG/5ML IV SOLN
20.0000 mg | Freq: Once | INTRAVENOUS | Status: DC
  Filled 2016-07-28: qty 5

## 2016-07-28 MED ORDER — LEVALBUTEROL HCL 0.63 MG/3ML IN NEBU: 0.6300 mg | INHALATION_SOLUTION | Freq: Four times a day (QID) | RESPIRATORY_TRACT | Status: DC | PRN

## 2016-07-28 NOTE — H&P (Addendum)
Nicholas Caldwell GBT:[id number] DOB: 1946/01/10 DOA: 07/27/2016     PCP: [facility]   Outpatient Specialists: Cardiology [name], Rayann Heman  Patient coming from:    home Lives  With family    Chief Complaint: Dyspnea  HPI: Nicholas Caldwell is a 70 y.o. male with medical history significant of non-ischemic cardiomyopathy, Chronic Systolic CHF EF 71-06%, A-fibon Xarelto,Prolonged Q-T interval , HTN, HLD, COPD on 2 L O2 at home PRN,, Tobacco Abuse, DM Type 2 uncontrolled with complication ,and Chronic Pain on Methadone    Presented with dyspnea and wheezing started around 7 PM. Patient was at rest at the time watching TV. He has had intermittent fevers for the past few days which was subjective he does not check his temperature. At baseline he is on 2 L of oxygen he's been having productive cough with thick yellow sputum. On arrival to emerge department he was having wheezing and nebulizer treatment was given after which the wheezing has improved and overall he's felt much better.  Patient also endorses that he's been retaining more fluid his weight is 261 pounds which is not very different from baseline but he does endorse lower extremity swelling bilaterally as well as abdominal distention. No chest pain no nausea no vomiting no diarrhea Regarding pertinent Chronic problems: Patient has been recently admitted on 27 of August for acute respiratory failure thought to be secondary to combination of CHF and COPD exacerbation requiring BiPAP Re: History of systolic heart failure the patient followed up by cardiology his baseline weight is 260 pounds. He is maintained on digoxin and Lasix as well as spironolactone. Known history of prolonged QTC felt to be secondary to methadone it was reduced during last admission Has known history of atrial fibrillation (CHADS2-VASc 5) on Xarelto and coreg Patient has known history of diabetes on insulin IN ER:  Temp (24hrs), Avg:98.3 F (36.8 C), Min:98.3  F (36.8 C), Max:98.3 F (36.8 C) 9 L heart rate 112 blood pressure 167/75 VBG 7.393/59.4/69.2  BNP 414 which is down from prior 660 WBC 9.9 hemoglobin 11.7 Sodium 141 bicarbonate 35 glucose 278  Chest x-ray showing vascular congestion and cardiomegaly worrisome for pulmonary edema Following Medications were ordered in ER: Medications  albuterol (PROVENTIL) (2.5 MG/3ML) 0.083% nebulizer solution 5 mg (5 mg Nebulization Given 07/27/16 2137)  furosemide (LASIX) injection 60 mg (60 mg Intravenous Given 07/27/16 2334)  levalbuterol (XOPENEX) nebulizer solution 0.63 mg (0.63 mg Nebulization Given 07/27/16 2348)      Hospitalist was called for admission for acute on chronic systolic CHF exacerbation and COPD exacerbation resulting in dyspnea  Review of Systems:    Pertinent positives include:   Chills, shortness of breath at rest.   dyspnea on exertion,   excess mucus,   productive cough,  Bilateral lower extremity swelling  Constitutional:  No weight loss, night sweats,  fatigue, weight loss  HEENT:  No headaches, Difficulty swallowing,Tooth/dental problems,Sore throat,  No sneezing, itching, ear ache, nasal congestion, post nasal drip,  Cardio-vascular:  No chest pain, Orthopnea, PND, anasarca, dizziness, palpitations.no GI:  No heartburn, indigestion, abdominal pain, nausea, vomiting, diarrhea, change in bowel habits, loss of appetite, melena, blood in stool, hematemesis Resp:  no  No non-productive cough, No coughing up of blood.No change in color of mucus.No wheezing. Skin:  no rash or lesions. No jaundice GU:  no dysuria, change in color of urine, no urgency or frequency. No straining to urinate.  No flank pain.  Musculoskeletal:  No joint pain or  no joint swelling. No decreased range of motion. No back pain.  Psych:  No change in mood or affect. No depression or anxiety. No memory loss.  Neuro: no localizing neurological complaints, no tingling, no weakness, no double  vision, no gait abnormality, no slurred speech, no confusion  As per HPI otherwise 10 point review of systems negative.   Past Medical History: Past Medical History:  Diagnosis Date  . Atrial flutter ([place])    a. recurrent AFlutter with RVR;  b. Amiodarone Rx started 4/16  . CAD (coronary artery disease)    a. LHC 1/16:  mLAD diffuse disease, pLCx mild disease, dLCx with disease but too small for PCI, RCA ok, EF 25-30%  . Chronic pain   . Chronic systolic CHF (congestive heart failure) ([place])   . COPD (chronic obstructive pulmonary disease) ([place])   . Diabetes mellitus without complication ([place])   . Hypercholesteremia   . Hypertension   . NICM (nonischemic cardiomyopathy) ([place])    a.dx 2016. b. 2D echo 06/2016 - Last echo 07/01/16: mod dilated LV, mod LVH, EF 25-30%, mild-mod MR, sev LAE, mild-mod reduced RV systolic function, mild-mod TR, PASP 2mHG.  .Marland KitchenPAF (paroxysmal atrial fibrillation) (HCC)    On amio - ot a candidate for flecainide due to cardiomyopathy, not a candidate for Tikosyn due to prolonged QT, and felt to be a poor candidate for ablation given left atrial size.  . Prolonged Q-T interval on ECG   . [place]monary [place]   . Tobacco abuse    Past Surgical History:  Procedure Laterality Date  . LEFT AND RIGHT HEART CATHETERIZATION WITH CORONARY ANGIOGRAM N/A 11/06/2014   Procedure: LEFT AND RIGHT HEART CATHETERIZATION WITH CORONARY ANGIOGRAM;  Surgeon: JJettie Booze MD;  [facility];  Service: Cardiovascular;  Laterality: N/A;  . SPLENECTOMY       Social History:  Ambulatory  cane or  walker       reports that he has been smoking Cigarettes.  He has been smoking about 0.05 packs per day. He has never used smokeless tobacco. He reports that he does not drink alcohol or use drugs.  Allergies:  No Known Allergies     Family History:   Family History  Problem Relation Age of Onset  . Heart disease Mother   . Hypertension Mother   . Heart  failure Mother   . Heart disease Father   . Hypertension Sister   . Heart attack Neg Hx   . Stroke Neg Hx     Medications: Prior to Admission medications   Medication Sig Start Date End Date Taking? Authorizing Provider  albuterol (PROVENTIL HFA;VENTOLIN HFA) 108 (90 BASE) MCG/ACT inhaler Inhale 2 puffs into the lungs every 6 (six) hours as needed for wheezing or shortness of breath. 11/07/14  Yes UKinnie Feil MD  budesonide-formoterol (SYMBICORT) 160-4.5 MCG/ACT inhaler Inhale 2 puffs into the lungs 2 (two) times daily. Rinse mouth well with water after each use   Yes Historical Provider, MD  carvedilol (COREG) 25 MG tablet Take 25 mg by mouth 2 (two) times daily with a meal.   Yes Historical Provider, MD  diazepam (VALIUM) 10 MG tablet Take 0.5 tablets (5 mg total) by mouth daily as needed for anxiety or sleep. 07/04/16  Yes Belkys A Regalado, MD  diclofenac sodium (VOLTAREN) 1 % GEL Apply 2 g topically 2 (two) times daily as needed (for pain).   Yes Historical Provider, MD  digoxin (LANOXIN) 0.125 MG tablet  Take 1 tablet (0.125 mg total) by mouth daily. 09/12/15  Yes Scott T Kathlen Mody, PA-C  fluticasone (FLONASE) 50 MCG/ACT nasal spray Place 2 sprays into both nostrils daily.   Yes Historical Provider, MD  furosemide (LASIX) 40 MG tablet Take 80 mg by mouth 3 (three) times daily.   Yes Historical Provider, MD  gabapentin (NEURONTIN) 600 MG tablet Take 1-3 tablets (600-1,800 mg total) by mouth See admin instructions. Take 1 tablet (600 mg) by mouth daily with lunch,3 tablets (1800 mg) at bedtime 07/04/16  Yes Belkys A Regalado, MD  glipiZIDE (GLUCOTROL) 10 MG tablet Take 10 mg by mouth 2 (two) times daily before a meal.    Yes Historical Provider, MD  glucose 4 GM chewable tablet Chew 4 tablets by mouth See admin instructions. Chew 4 tablets by mouth as needed for low blood sugar - repeat every 15 minutes if blood sugar less than 70   Yes Historical Provider, MD  hydrALAZINE (APRESOLINE) 25 MG  tablet Take 1 tablet (25 mg total) by mouth 3 (three) times daily. 09/12/15  Yes Scott Joylene Draft, PA-C  hydrocortisone (ANUSOL-HC) 2.5 % rectal cream Place 1 application rectally 2 (two) times daily as needed for hemorrhoids or itching. Use up to 2 weeks at a time as needed   Yes Historical Provider, MD  hydrocortisone 1 % lotion Apply 1 application topically See admin instructions. Apply small amount to back twice daily for itchy rash   Yes Historical Provider, MD  insulin detemir (LEVEMIR) 100 UNIT/ML injection Inject 0.24 mLs (24 Units total) into the skin every 12 (twelve) hours. Patient taking differently: Inject 24 Units into the skin every evening.  07/04/16  Yes Belkys A Regalado, MD  isosorbide mononitrate (IMDUR) 30 MG 24 hr tablet Take 1 tablet (30 mg total) by mouth daily. 10/09/15  Yes Jettie Booze, MD  metFORMIN (GLUCOPHAGE-XR) 500 MG 24 hr tablet Take 1,000 mg by mouth 2 (two) times daily with a meal.   Yes Historical Provider, MD  methadone (DOLOPHINE) 10 MG tablet Take 1 tablet (10 mg total) by mouth every 8 (eight) hours. 07/04/16  Yes Belkys A Regalado, MD  potassium chloride SA (K-DUR,KLOR-CON) 20 MEQ tablet Take 2 tablets (40 mEq total) by mouth daily. 11/07/14  Yes Kinnie Feil, MD  rivaroxaban (XARELTO) 20 MG TABS tablet Take 1 tablet (20 mg total) by mouth daily with supper. 02/23/15  Yes Luan Moore, MD  rosuvastatin (CRESTOR) 40 MG tablet Take 20 mg by mouth daily. For cholesterol   Yes Historical Provider, MD  sacubitril-valsartan (ENTRESTO) 49-51 MG Take 1 tablet by mouth 2 (two) times daily. 09/12/15  Yes Scott Joylene Draft, PA-C  spironolactone (ALDACTONE) 25 MG tablet Take 1 tablet (25 mg total) by mouth daily. 08/02/15  Yes Liliane Shi, PA-C    Physical Exam: Patient Vitals for the past 24 hrs:  BP Temp Temp src Pulse Resp SpO2  07/27/16 2349 - - - - - 94 %  07/27/16 2330 167/75 - - (!) 56 18 92 %  07/27/16 2300 163/85 - - (!) 55 21 93 %  07/27/16 2230 153/86 - -  (!) 55 22 94 %  07/27/16 2229 153/86 - - (!) 56 19 92 %  07/27/16 2138 - - - - - 94 %  07/27/16 2118 153/71 98.3 F (36.8 C) Oral 114 20 97 %    1. General:  in No Acute distress 2. Psychological: Alert and   Oriented 3. Head/ENT:   Moist  Mucous Membranes                          Head Non traumatic, neck supple                            Poor Dentition 4. SKIN: normal  Skin turgor,  Skin clean Dry and intact no rash 5. Heart: Regular rate and rhythm no  Murmur, Rub or gallop 6. Lungs: some occasional  wheezes andcrackles   7. Abdomen: Soft,   non-tender,  distended 8. Lower extremities: no clubbing, cyanosis, trace edema 9. Neurologically Grossly intact, moving all 4 extremities equally 10. MSK: Normal range of motion   body mass index is unknown because there is no height or weight on file.  Labs on Admission:   Labs on Admission: I have personally reviewed following labs and imaging studies  CBC:  Recent Labs Lab 07/27/16 2220  WBC 9.9  NEUTROABS 7.0  HGB 11.7*  HCT 38.1*  MCV 87.2  PLT 701   Basic Metabolic Panel:  Recent Labs Lab 07/27/16 2220  NA 141  K 4.0  CL 98*  CO2 35*  GLUCOSE 278*  BUN 12  CREATININE 0.82  CALCIUM 8.5*   GFR: Estimated Creatinine Clearance: 114.8 mL/min (by C-G formula based on SCr of 0.82 mg/dL). Liver Function Tests: No results for input(s): AST, ALT, ALKPHOS, BILITOT, PROT, ALBUMIN in the last 168 hours. No results for input(s): LIPASE, AMYLASE in the last 168 hours. No results for input(s): AMMONIA in the last 168 hours. Coagulation Profile: No results for input(s): INR, PROTIME in the last 168 hours. Cardiac Enzymes: No results for input(s): CKTOTAL, CKMB, CKMBINDEX, TROPONINI in the last 168 hours. BNP (last 3 results)  Recent Labs  08/02/15 1714  PROBNP 363.0*   HbA1C: No results for input(s): HGBA1C in the last 72 hours. CBG: No results for input(s): GLUCAP in the last 168 hours. Lipid Profile: No results  for input(s): CHOL, HDL, LDLCALC, TRIG, CHOLHDL, LDLDIRECT in the last 72 hours. Thyroid Function Tests: No results for input(s): TSH, T4TOTAL, FREET4, T3FREE, THYROIDAB in the last 72 hours. Anemia Panel: No results for input(s): VITAMINB12, FOLATE, FERRITIN, TIBC, IRON, RETICCTPCT in the last 72 hours.   _0 (procalcitonin:4,lacticidven:4) )No results found for this or any previous visit (from the past 240 hour(s)).    UA  not ordered  Lab Results  Component Value Date   HGBA1C 9.4 (H) 06/30/2016    Estimated Creatinine Clearance: 114.8 mL/min (by C-G formula based on SCr of 0.82 mg/dL).  BNP (last 3 results)  Recent Labs  08/02/15 1714  PROBNP 363.0*     ECG REPORT  Independently reviewed Rate: 130  Rhythm: a.fib ST&T Change: No acute ischemic changes  QTC 510  There were no vitals filed for this visit.   Cultures:    Component Value Date/Time   SDES BLOOD LEFT HAND 05/25/2016 2016   SPECREQUEST BOTTLES DRAWN AEROBIC AND ANAEROBIC 5ML 05/25/2016 2016   CULT  05/25/2016 2016    NO GROWTH 5 DAYS Performed at [facility]    REPTSTATUS 05/30/2016 FINAL 05/25/2016 2016     Radiological Exams on Admission: Dg Chest 2 View  Result Date: 07/27/2016 CLINICAL DATA:  Acute onset of shortness of breath and productive cough. Mild wheezing. Initial encounter. EXAM: CHEST  2 VIEW COMPARISON:  Chest radiograph performed 06/29/2016 FINDINGS: Vascular congestion is noted. Increased interstitial markings  raise concern for pulmonary edema. No definite pleural effusion or pneumothorax is seen. The heart is enlarged. No acute osseous abnormalities are identified. IMPRESSION: Vascular congestion and cardiomegaly. Increased interstitial markings raise concern for pulmonary edema. Electronically Signed   By: Garald Balding M.D.   On: 07/27/2016 22:18    Chart has been reviewed    Assessment/Plan   70 y.o. male with medical history significant of non-ischemic  cardiomyopathy, Chronic Systolic CHF EF 27-06%, A-fibon Xarelto,Prolonged Q-T interval , HTN, HLD, COPD on 2 L O2 at home PRN,, Tobacco Abuse, DM Type 2 uncontrolled with complication ,and Chronic Pain on Methadonebeing admitted for acute on chronic CHF exacerbation and COPD exacerbation     Present on Admission: Dyspnea most likely multifactorial combination of acute on chronic CHF exacerbation as well as COPD exacerbation patient is on chronic anticoagulation making PE less likely . Acute on chronic systolic CHF (congestive heart failure) ([place]) - - admit on telemetry, cycle cardiac enzymes, obtain serial ECG, to evaluate for ischemia as a cause of heart failure  monitor daily weight  diurese with IV lasix and monitor orthostatics and creatinine to avoid over diuresis.  echogram done during prior admission in end of August showing EF of 25-30%   patient is on  [facility]  cardiology consult in AM given repeated admissions and Qt prolongation . CAD (coronary artery disease) - Non-obstructive by LHC1/16 stable continue to cycle cardiac enzymes . Chronic pain patient was supposed to have decreased dose of methadone given prolonged QT will attempt to just . COPD exacerbation ([place]) -  - Will initiate Steroid taper, antibiotics, Albuterol PRN, scheduled duoneb, Dulera and Mucinex. Titrate O2 to saturation >90%. Follow patients respiratory status.  COPD Gold protocol initiated . Essential hypertension stable continue home medications . Hyperlipidemia associated with type 2 diabetes mellitus ([place]) stable continue home medications . Morbid obesity ([place]) this is chronic ongoing issue patient will need long-term outpatient follow-up nutritional consult . PAF (paroxysmal atrial fibrillation) ([place]) (CHADS2-VASc 5) on Xarelto and coreg . Uncontrolled type 2 diabetes mellitus with complication (H[facility]) continue home dose of insulin hold by mouth medications order sliding scale QT prolongation - monitor on  telemetry we'll try to decrease the dose of methadone father to 10 twice a day phone to discuss with cardiology attempt to minimize use of QT prolonging medications monitored electrolytes  Other plan as per orders.  DVT prophylaxis:  Xarelto  Code Status:  FULL CODE as per patient   Family Communication:   Family   at  Bedside  plan of care was discussed with  Wife [name] [phone]  Disposition Plan:      To home once workup is complete and patient is stable                        Would benefit from PT/OT eval prior to DC   ordered                                           Consults called: Cardiology emailed  Admission status:   inpatient       Level of care     SDU      I have spent a total of 57 min on this admission   Sally-Ann Cutbirth 07/28/2016, 12:57 AM    [facility]  Pager [phone]   after 2 AM please  page floor coverage PA If 7AM-7PM, please contact the day team taking care of the patient  [email or web address]  Password [email or web address]

## 2016-07-28 NOTE — Progress Notes (Signed)
Patient stated he was told he was only coming to ICU/SD until a tele bed opened by ED physician, and he thought that meant he did not have to wear heart monitor or call before getting up. Patient educated multiple times on unit regulations and necessity of calling before attempting to get out of bed. Despite bed alarm and monitors patient repeatedly removed equipment and continued to get up. Bed alarm set, door kept open, and patient and wife educated. Will continue to monitor.

## 2016-07-28 NOTE — ED Notes (Signed)
Pt's room assignment changed to [id number] per Jenny Reichmann, ICU.  Pt & family informed.

## 2016-07-28 NOTE — Progress Notes (Signed)
PROGRESS NOTE    [name]  JSH:[id number] DOB: [birth year] DOA: 07/27/2016  PCP: [facility]   Brief Narrative:  Nicholas Caldwell is a 70 y.o. male with medical history significant of non-ischemic cardiomyopathy, Chronic Systolic CHF EF 85-02%, A-fibon Xarelto,Prolonged Q-T interval , HTN, HLD, COPD on 2 L O2 at home PRN,, Tobacco Abuse, DM Type 2 uncontrolled, chronic pain on Methadonewho presents with shortness of breath which started while he was watching TV at 7pm. He was noted to be wheezing in the ER which improved after a breathing treatment. He felt he had increasing pedal edema. He was admitted for dyspnea.      Subjective: No longer feeling dyspneic at rest. Noted to have SVT with HR in 160s. Tells me that the heart monitor is broken. I checked his pulse and auscultated his chest. Monitor appears to be accurate which I explained to the patient. The patient subsequently became rude, aggressive and verbally abusive. He further stated that since I was not a cardiologist he did not want me to correct his cardiac issues.   Assessment & Plan:   Principal Problem:   PAF (paroxysmal atrial fibrillation)/ SVT/ mildly elevated troponin - HR improved after being given oral medications- have consulted cardiology to manage cardiac issues - currently on XArelto, Dig, Coreg - TSH normal  Active Problems: Dyspnea- ? Acute on chronic systolic CHF, COPD - resolved after Neb treatment- CXR suggestive of pulm edema but weight unchanged from baseline and BNP actually lower than prior - currently received Lasix 60 mg IV Q12 - cont treatment for COPD- wean Prednisone as able- on Doxycycline as well for possible acute bronchitis -  cont and O2    Essential hypertension  - controlled on current medications    Type 2 diabetes mellitus with diabetic neuropathy, with long-term current use of insulin - Glucotrol and Metformin on hold - will resume Glucotrol as sugars are elevated - cont  SSI and Levemir    Hyperlipidemia associated with type 2 diabetes mellitus   - Crestor    Morbid obesity  Body mass index is 36.99 kg/m.    Chronic pain - Methadone    CAD (coronary artery disease) - Non-obstructive by LHC1/16     DVT prophylaxis: Xarelto Code Status: Full code Family Communication:  Disposition Plan: home when stable in 1-2 days Consultants:   cardiology Procedures:    Antimicrobials:  Anti-infectives    Start     Dose/Rate Route Frequency Ordered Stop   07/28/16 0230  doxycycline (VIBRA-TABS) tablet 100 mg     100 mg Oral Every 12 hours 07/28/16 0215         Objective: Vitals:   07/28/16 1105 07/28/16 1130 07/28/16 1200 07/28/16 1400  BP:  (!) 136/94 123/84 119/72  Pulse: 76 95 76 78  Resp: (!) _0 Temp:   98.1 F (36.7 C)   TempSrc:   Oral   SpO2: 91% 93% 95% 94%  Weight:      Height:        Intake/Output Summary (Last 24 hours) at 07/28/16 1418 Last data filed at 07/28/16 1351  Gross per 24 hour  Intake              600 ml  Output             3200 ml  Net            -2600 ml   Filed Weights   07/28/16 0300  Weight: 123.7 kg (272 lb 11.3 oz)    Examination: General exam: Appears comfortable  HEENT: PERRLA, oral mucosa moist, no sclera icterus or thrush Respiratory system: Clear to auscultation. Respiratory effort normal. Cardiovascular system: S1 & S2 heard, RRR- HR in 160s - No murmurs  Gastrointestinal system: Abdomen soft, non-tender, nondistended. Normal bowel sound. No organomegaly Central nervous system: Alert and oriented. No focal neurological deficits. Extremities: No cyanosis, clubbing or edema Skin: No rashes or ulcers Psychiatry:  Mood & affect appropriate.     Data Reviewed: I have personally reviewed following labs and imaging studies  CBC:  Recent Labs Lab 07/27/16 2220 07/28/16 0319  WBC 9.9 9.5  NEUTROABS 7.0  --   HGB 11.7* 11.9*  HCT 38.1* 39.3  MCV 87.2 87.3  PLT 358 891   Basic  Metabolic Panel:  Recent Labs Lab 07/27/16 2220 07/28/16 0319  NA 141 142  K 4.0 3.8  CL 98* 96*  CO2 35* 37*  GLUCOSE 278* 230*  BUN 12 12  CREATININE 0.82 0.82  CALCIUM 8.5* 8.8*  MG  --  1.8  PHOS  --  4.3   GFR: Estimated Creatinine Clearance: 117.1 mL/min (by C-G formula based on SCr of 0.82 mg/dL). Liver Function Tests:  Recent Labs Lab 07/28/16 0319  AST 15  ALT 22  ALKPHOS 108  BILITOT 0.4  PROT 7.3  ALBUMIN 3.4*   No results for input(s): LIPASE, AMYLASE in the last 168 hours. No results for input(s): AMMONIA in the last 168 hours. Coagulation Profile: No results for input(s): INR, PROTIME in the last 168 hours. Cardiac Enzymes:  Recent Labs Lab 07/28/16 0020 07/28/16 0637  TROPONINI 0.04* 0.04*   BNP (last 3 results)  Recent Labs  08/02/15 1714  PROBNP 363.0*   HbA1C: No results for input(s): HGBA1C in the last 72 hours. CBG:  Recent Labs Lab 07/28/16 1155  GLUCAP 309*   Lipid Profile: No results for input(s): CHOL, HDL, LDLCALC, TRIG, CHOLHDL, LDLDIRECT in the last 72 hours. Thyroid Function Tests:  Recent Labs  07/28/16 0319  TSH 1.745   Anemia Panel: No results for input(s): VITAMINB12, FOLATE, FERRITIN, TIBC, IRON, RETICCTPCT in the last 72 hours. Urine analysis:    Component Value Date/Time   COLORURINE YELLOW 10/26/2014 2206   APPEARANCEUR CLEAR 10/26/2014 2206   LABSPEC 1.017 10/26/2014 2206   PHURINE 6.0 10/26/2014 2206   GLUCOSEU NEGATIVE 10/26/2014 2206   HGBUR NEGATIVE 10/26/2014 2206   BILIRUBINUR NEGATIVE 10/26/2014 2206   KETONESUR NEGATIVE 10/26/2014 2206   PROTEINUR NEGATIVE 10/26/2014 2206   UROBILINOGEN 0.2 10/26/2014 2206   NITRITE NEGATIVE 10/26/2014 2206   LEUKOCYTESUR NEGATIVE 10/26/2014 2206   Sepsis Labs: _0 (procalcitonin:4,lacticidven:4) ) Recent Results (from the past 240 hour(s))  MRSA PCR Screening     Status: None   Collection Time: 07/28/16  3:01 AM  Result Value Ref Range  Status   MRSA by PCR NEGATIVE NEGATIVE Final    Comment:        The GeneXpert MRSA Assay (FDA approved for NASAL specimens only), is one component of a comprehensive MRSA colonization surveillance program. It is not intended to diagnose MRSA infection nor to guide or monitor treatment for MRSA infections.          Radiology Studies: Dg Chest 2 View  Result Date: 07/27/2016 CLINICAL DATA:  Acute onset of shortness of breath and productive cough. Mild wheezing. Initial encounter. EXAM: CHEST  2 VIEW COMPARISON:  Chest radiograph performed 06/29/2016 FINDINGS: Vascular congestion  is noted. Increased interstitial markings raise concern for pulmonary edema. No definite pleural effusion or pneumothorax is seen. The heart is enlarged. No acute osseous abnormalities are identified. IMPRESSION: Vascular congestion and cardiomegaly. Increased interstitial markings raise concern for pulmonary edema. Electronically Signed   By: Garald Balding M.D.   On: 07/27/2016 22:18      Scheduled Meds: . carvedilol  25 mg Oral BID WC  . digoxin  0.125 mg Oral Daily  . diltiazem  20 mg Intravenous Once  . doxycycline  100 mg Oral Q12H  . furosemide  60 mg Intravenous Q12H  . gabapentin  1,800 mg Oral QHS  . gabapentin  600 mg Oral Q lunch  . guaiFENesin  600 mg Oral BID  . hydrALAZINE  25 mg Oral TID  . insulin aspart  0-15 Units Subcutaneous TID WC  . insulin aspart  0-5 Units Subcutaneous QHS  . insulin detemir  24 Units Subcutaneous QPM  . ipratropium  0.5 mg Nebulization TID  . isosorbide mononitrate  30 mg Oral Daily  . levalbuterol  0.63 mg Nebulization TID  . methadone  10 mg Oral Q12H  . mometasone-formoterol  2 puff Inhalation BID  . potassium chloride SA  40 mEq Oral Daily  . predniSONE  40 mg Oral BID WC  . rivaroxaban  20 mg Oral Q supper  . rosuvastatin  20 mg Oral Daily  . sacubitril-valsartan  1 tablet Oral BID  . sodium chloride flush  3 mL Intravenous Q12H  . sodium  chloride flush  3 mL Intravenous Q12H  . spironolactone  25 mg Oral Daily   Continuous Infusions:    LOS: 0 days    Time spent in minutes: 81    Jamaira Sherk, MD [facility] Pager: [email or web address] Password [email or web address] 07/28/2016, 2:18 PM

## 2016-07-28 NOTE — Consult Note (Signed)
Patient ID: Nicholas Caldwell MRN: [id number], DOB/AGE: [birth year]   Admit date: 07/27/2016   Reason for Consult: Atrial Fibrillation w/ RVR Requesting MD: Dr. Roel Cluck, Internal Medicine    Primary Physician: [facility] Primary Cardiologist: Dr. [name] Electrophysiologist: Dr. Rayann Heman   Pt. Profile:  70 y.o. male with history of nonischemic cardiomyopathy dx 8/14, chronic systolic HF w/ EF of 48-18%, paroxysmal atrial fib/flutter on Xarelto, Prolonged QT, HTN, HL, diabetes c/b neuropathy, chronic pain on methadone followed at pain clinic, questionable history of seizures, tobacco abuse, morbid obesity, COPD with chronic respiratory failure, admitted for COPD exacerbation, a/c systolic HF and atrial fibrillation/ flutter w/ RVR.   Problem List  Past Medical History:  Diagnosis Date  . Atrial flutter ([place])    a. recurrent AFlutter with RVR;  b. Amiodarone Rx started 4/16  . CAD (coronary artery disease)    a. LHC 1/16:  mLAD diffuse disease, pLCx mild disease, dLCx with disease but too small for PCI, RCA ok, EF 25-30%  . Chronic pain   . Chronic systolic CHF (congestive heart failure) ([place])   . COPD (chronic obstructive pulmonary disease) ([place])   . Diabetes mellitus without complication ([place])   . Hypercholesteremia   . Hypertension   . NICM (nonischemic cardiomyopathy) ([place])    a.dx 2016. b. 2D echo 06/2016 - Last echo 07/01/16: mod dilated LV, mod LVH, EF 25-30%, mild-mod MR, sev LAE, mild-mod reduced RV systolic function, mild-mod TR, PASP 23mHG.  .Marland KitchenPAF (paroxysmal atrial fibrillation) (HCC)    On amio - ot a candidate for flecainide due to cardiomyopathy, not a candidate for Tikosyn due to prolonged QT, and felt to be a poor candidate for ablation given left atrial size.  . Prolonged Q-T interval on ECG   . Pulmonary hypertension ([place]   . Tobacco abuse     Past Surgical History:  Procedure Laterality Date  . LEFT AND RIGHT HEART CATHETERIZATION WITH CORONARY  ANGIOGRAM N/A 11/06/2014   Procedure: LEFT AND RIGHT HEART CATHETERIZATION WITH CORONARY ANGIOGRAM;  Surgeon: JJettie Booze MD;  [facility];  Service: Cardiovascular;  Laterality: N/A;  . SPLENECTOMY       Allergies  No Known Allergies  HPI  70y.o. male with history of nonischemic cardiomyopathy dx 15/63 chronic systolic HF, paroxysmal atrial fib/flutter, Prolonged QT, HTN, HL, diabetes c/b neuropathy, chronic pain on methadone followed at pain clinic, questionable history of seizures, tobacco abuse, morbid obesity, COPD with chronic respiratory   Per records, he was diagnosed with NICM in 2016. He had a LHC January 2016 demonstrated mild nonobstructive CAD. Regarding his afib,he was previously seen by EP and not a candidate for flecainide due to cardiomyopathy, not a candidate for Tikosyn due to prolonged QT, and felt to be a poor candidate for ablation given left atrial size. Dr. ARayann Hemanhas felt that RFCA of his aflutter would result in more afib. He was screened for Genetic AF and was not felt to be a good candidate. Rate control was recommended. He was on amiodarone most recently. Per notes patient has previously declined ICD. His CHA2DS2 VASc score is 5. He is on Xarelto.   It appears his amiodarone was discontinued during recent admission for acute COPD exacerbation, hypoxia, and hypercarbia 06/2016. He had prolonged QT felt due to his methadone use, which was adjusted by IM. Amiodarone was discontinued- presumably due to prolonged QT.   He was recently seen in clinic for f/u. He was seen by DMelina Copa  PA-C. In regards to his CHF, she recommended referral to [facility]. It was noted that he was still in atrial flutter and HR was slightly elevated, but this was in the setting of a missed dose of his Coreg, prior to his visit. F/u visit was recommended later in the week to reassess HR, however patient refused appointment.   He [facility] ED on 07/27/16 with  complaint of dyspnea and wheezing. Also subjective fevers and productive cough with yellow sputum. He was admitted by IM for acute respiratory failure in the setting of recurrence COPD exacerbation and a/c systolic CHF. Cardiology consulted for rapid atrial fibrillation.   Pt's HR spiked to the 160s earlier this am. He was moving around in his bed and accidentally pulled out one of his IVs. He saw a lot of blood on his bed sheets and states he was frightened buy the bleeding. His HR has now improved into the 110s-120s. IV team is currently getting new IV access. He is resting comfortably but still with productive cough. No resting dyspnea. Still dyspneic with exertion. No CP.    Home Medications  Prior to Admission medications   Medication Sig Start Date End Date Taking? Authorizing Provider  albuterol (PROVENTIL HFA;VENTOLIN HFA) 108 (90 BASE) MCG/ACT inhaler Inhale 2 puffs into the lungs every 6 (six) hours as needed for wheezing or shortness of breath. 11/07/14  Yes Kinnie Feil, MD  budesonide-formoterol (SYMBICORT) 160-4.5 MCG/ACT inhaler Inhale 2 puffs into the lungs 2 (two) times daily. Rinse mouth well with water after each use   Yes Historical Provider, MD  carvedilol (COREG) 25 MG tablet Take 25 mg by mouth 2 (two) times daily with a meal.   Yes Historical Provider, MD  diazepam (VALIUM) 10 MG tablet Take 0.5 tablets (5 mg total) by mouth daily as needed for anxiety or sleep. 07/04/16  Yes Belkys A Regalado, MD  diclofenac sodium (VOLTAREN) 1 % GEL Apply 2 g topically 2 (two) times daily as needed (for pain).   Yes Historical Provider, MD  digoxin (LANOXIN) 0.125 MG tablet Take 1 tablet (0.125 mg total) by mouth daily. 09/12/15  Yes Scott T Kathlen Mody, PA-C  fluticasone (FLONASE) 50 MCG/ACT nasal spray Place 2 sprays into both nostrils daily.   Yes Historical Provider, MD  furosemide (LASIX) 40 MG tablet Take 80 mg by mouth 3 (three) times daily.   Yes Historical Provider, MD  gabapentin  (NEURONTIN) 600 MG tablet Take 1-3 tablets (600-1,800 mg total) by mouth See admin instructions. Take 1 tablet (600 mg) by mouth daily with lunch,3 tablets (1800 mg) at bedtime 07/04/16  Yes Belkys A Regalado, MD  glipiZIDE (GLUCOTROL) 10 MG tablet Take 10 mg by mouth 2 (two) times daily before a meal.    Yes Historical Provider, MD  glucose 4 GM chewable tablet Chew 4 tablets by mouth See admin instructions. Chew 4 tablets by mouth as needed for low blood sugar - repeat every 15 minutes if blood sugar less than 70   Yes Historical Provider, MD  hydrALAZINE (APRESOLINE) 25 MG tablet Take 1 tablet (25 mg total) by mouth 3 (three) times daily. 09/12/15  Yes Scott Joylene Draft, PA-C  hydrocortisone (ANUSOL-HC) 2.5 % rectal cream Place 1 application rectally 2 (two) times daily as needed for hemorrhoids or itching. Use up to 2 weeks at a time as needed   Yes Historical Provider, MD  hydrocortisone 1 % lotion Apply 1 application topically See admin instructions. Apply small amount  to back twice daily for itchy rash   Yes Historical Provider, MD  insulin detemir (LEVEMIR) 100 UNIT/ML injection Inject 0.24 mLs (24 Units total) into the skin every 12 (twelve) hours. Patient taking differently: Inject 24 Units into the skin every evening.  07/04/16  Yes Belkys A Regalado, MD  isosorbide mononitrate (IMDUR) 30 MG 24 hr tablet Take 1 tablet (30 mg total) by mouth daily. 10/09/15  Yes Jettie Booze, MD  metFORMIN (GLUCOPHAGE-XR) 500 MG 24 hr tablet Take 1,000 mg by mouth 2 (two) times daily with a meal.   Yes Historical Provider, MD  methadone (DOLOPHINE) 10 MG tablet Take 1 tablet (10 mg total) by mouth every 8 (eight) hours. 07/04/16  Yes Belkys A Regalado, MD  potassium chloride SA (K-DUR,KLOR-CON) 20 MEQ tablet Take 2 tablets (40 mEq total) by mouth daily. 11/07/14  Yes Kinnie Feil, MD  rivaroxaban (XARELTO) 20 MG TABS tablet Take 1 tablet (20 mg total) by mouth daily with supper. 02/23/15  Yes Luan Moore, MD    rosuvastatin (CRESTOR) 40 MG tablet Take 20 mg by mouth daily. For cholesterol   Yes Historical Provider, MD  sacubitril-valsartan (ENTRESTO) 49-51 MG Take 1 tablet by mouth 2 (two) times daily. 09/12/15  Yes Scott Joylene Draft, PA-C  spironolactone (ALDACTONE) 25 MG tablet Take 1 tablet (25 mg total) by mouth daily. 08/02/15  Yes Liliane Shi, [place]  . carvedilol  25 mg Oral BID WC  . digoxin  0.125 mg Oral Daily  . diltiazem  20 mg Intravenous Once  . doxycycline  100 mg Oral Q12H  . furosemide  60 mg Intravenous Q12H  . gabapentin  1,800 mg Oral QHS  . gabapentin  600 mg Oral Q lunch  . guaiFENesin  600 mg Oral BID  . hydrALAZINE  25 mg Oral TID  . insulin aspart  0-15 Units Subcutaneous TID WC  . insulin aspart  0-5 Units Subcutaneous QHS  . insulin detemir  24 Units Subcutaneous QPM  . ipratropium  0.5 mg Nebulization TID  . isosorbide mononitrate  30 mg Oral Daily  . levalbuterol  0.63 mg Nebulization TID  . methadone  10 mg Oral Q12H  . mometasone-formoterol  2 puff Inhalation BID  . potassium chloride SA  40 mEq Oral Daily  . predniSONE  40 mg Oral BID WC  . rivaroxaban  20 mg Oral Q supper  . rosuvastatin  20 mg Oral Daily  . sacubitril-valsartan  1 tablet Oral BID  . sodium chloride flush  3 mL Intravenous Q12H  . sodium chloride flush  3 mL Intravenous Q12H  . spironolactone  25 mg Oral Daily     Family History  Family History  Problem Relation Age of Onset  . Heart disease Mother   . Hypertension Mother   . Heart failure Mother   . Heart disease Father   . Hypertension Sister   . Heart attack Neg Hx   . Stroke Neg Hx     Social History  Social History   Social History  . Marital status: Single    Spouse name: N/A  . Number of children: N/A  . Years of education: N/A   Occupational History  . Not on file.   Social History Main Topics  . Smoking status: Current Every Day Smoker    Packs/day: 0.05    Types: Cigarettes    Last  attempt to quit: 08/30/2015  . Smokeless tobacco: Never Used  .  Alcohol use No  . Drug use: No  . Sexual activity: Not on file   Other Topics Concern  . Not on file   Social History Narrative  . No narrative on file     Review of Systems General:  No chills, fever, night sweats or weight changes.  Cardiovascular:  No chest pain, +dyspnea on exertion, + edema, no orthopnea, palpitations, paroxysmal nocturnal dyspnea. Dermatological: No rash, lesions/masses Respiratory: +cough, +dyspnea Urologic: No hematuria, dysuria Abdominal:   No nausea, vomiting, diarrhea, bright red blood per rectum, melena, or hematemesis Neurologic:  No visual changes, wkns, changes in mental status. All other systems reviewed and are otherwise negative except as noted above.  Physical Exam  Blood pressure (!) 172/114, pulse (!) 137, temperature 98.8 F (37.1 C), temperature source Oral, resp. rate 20, height 6' (1.829 m), weight 272 lb 11.3 oz (123.7 kg), SpO2 94 %.  General:  Was sleeping when I came in to examine him toda  NAD, morbidly obese  Psych: Normal affect. Neuro: Alert and oriented X 3. Moves all extremities spontaneously. HEENT: Normal  Neck: Supple without bruits. Elevated JVD. Lungs:  Resp regular and unlabored,  Bilateral expiratory wheezing diffusely  Heart: Reg Reg.  ( atrial flutter at 80) , tachy rate no s3, s4, or murmurs. Abdomen: distended, non-tender, BS + x 4.  Extremities: massive 3+ bilateral LE pitting edema. DP/PT/Radials 2+ and equal bilaterally.  Labs  Troponin [facility] of Care Test)  Recent Labs  07/27/16 2243  TROPIPOC 0.02    Recent Labs  07/28/16 0020 07/28/16 0637  TROPONINI 0.04* 0.04*   Lab Results  Component Value Date   WBC 9.5 07/28/2016   HGB 11.9 (L) 07/28/2016   HCT 39.3 07/28/2016   MCV 87.3 07/28/2016   PLT 372 07/28/2016     Recent Labs Lab 07/28/16 0319  NA 142  K 3.8  CL 96*  CO2 37*  BUN 12  CREATININE 0.82  CALCIUM 8.8*   PROT 7.3  BILITOT 0.4  ALKPHOS 108  ALT 22  AST 15  GLUCOSE 230*   Lab Results  Component Value Date   CHOL 147 11/01/2014   HDL 48 11/01/2014   LDLCALC 72 11/01/2014   TRIG 134 11/01/2014   Lab Results  Component Value Date   DDIMER 0.42 10/31/2014     Radiology/Studies  Dg Chest 2 View  Result Date: 07/27/2016 CLINICAL DATA:  Acute onset of shortness of breath and productive cough. Mild wheezing. Initial encounter. EXAM: CHEST  2 VIEW COMPARISON:  Chest radiograph performed 06/29/2016 FINDINGS: Vascular congestion is noted. Increased interstitial markings raise concern for pulmonary edema. No definite pleural effusion or pneumothorax is seen. The heart is enlarged. No acute osseous abnormalities are identified. IMPRESSION: Vascular congestion and cardiomegaly. Increased interstitial markings raise concern for pulmonary edema. Electronically Signed   By: Garald Balding M.D.   On: 07/27/2016 22:18   Dg Chest Portable 1 View  Result Date: 06/29/2016 CLINICAL DATA:  70 y/o M; increasing shortness of breath with history of COPD and smoking. EXAM: PORTABLE CHEST 1 VIEW COMPARISON:  05/25/2016 chest radiograph. FINDINGS: Stable cardiomediastinal silhouette given projection and technique. Interval increase in right greater than left perihilar and basilar opacities and blunted costophrenic angles in comparison with prior radiographs. No acute osseous abnormality is evident. IMPRESSION: Increasing right greater than left and basilar opacities probably represents pulmonary edema, possibly pneumonia. Small bilateral pleural effusions. Electronically Signed   By: Kristine Garbe M.D.   On: 06/29/2016  20:35    ECG  Atrial fibrillation w/ RVR    ASSESSMENT AND PLAN  Active Problems:   Essential hypertension   Type 2 diabetes mellitus with diabetic neuropathy, with long-term current use of insulin (HCC)   Hyperlipidemia associated with type 2 diabetes mellitus (HCC)   COPD  exacerbation (HCC)   Acute on chronic systolic CHF (congestive heart failure) (HCC)   Morbid obesity (HCC)   Chronic pain   PAF (paroxysmal atrial fibrillation) (HCC)   CAD (coronary artery disease) - Non-obstructive by LHC1/16   Uncontrolled type 2 diabetes mellitus with complication (HCC)   Acute exacerbation of CHF (congestive heart failure) ([place])   1. Atrial  Flutter/ Fibrillation w/ RVR: rate earlier was in the 160s. Pt was anxious and frightened by sight of bleeding after his IV came out. Now more relaxed. He was given his PO Coreg. HR improved into the 110s-120s. IV team present and starting new IV so that IV Cardizem can be given. Continue scheduled  BB and digoxin. No amiodarone given h/o prolonged QT. Continue management of acute COPD exacerbation and a/c CHF. Continue Xarelto for anticoagulation. Continue to monitor on telemetry.  HR is very stable at this point   2. A/C Systolic CHF: known NICM with EF of 25-30%. He is massively volume overloaded with 3+ bilateral LE pitting edema on exam. SCR, BUN, K and BP stable. Continue IV lasix. He has a condom cath to assist with UOP measurement for strict I/Os. Check daily weights. May benefit from metolazone or IV lasix drip to help increase diuresis. Monitor K closely to prevent hypokalemia given h/o prolonged QT. Monitor renal function. Low sodium diet. He may need a RHC after diuresis to insure that he is adequately diuresed prior to discharge. Agree that he would benefit from referral to [facility] post discharge.   3. NICM: EF 25-30%. Normal cath 11/2015. Per records, he refused ICD in the past. Continue medial  therapy with BB, Imdur, hydralazine, spironolactone and digoxin. He does not appear to be on an ACE/ARB. Renal function is ok. No listed allergies. Recommend addition of ACE or Entresto. Would also recommend changing BB from Coreg to a more cardioselective BB such as long acting metoprolol, given concomitant COPD and wheezing  on physical exam. Metoprolol would also provide better rate control for his afib.   4. Acute COPD Exacerbation: continue Abx, steroids and breathing treatments. Management per IM. Recommend changing from Coreg to cardioselective BB given COPD + wheezing.   5. HTN: poorly controlled. Consider addition of ACE/ARB as outlined above for systolic CHF and HTN. Can also further increase hydralazine dose. Continue BB and nitrate. Monitor closely. Low sodium diet.   6. T2DM: management per IM.   7. H/o Prolonged QT: QT/QTc 344/510 ms on recent EKG. No longer on amiodarone. K WNL.   8. Chronic Pain: IM managing methadone.   Signed, Lyda Jester, PA-C 07/28/2016, 10:56 AM   Attending Note:   The patient was seen and examined.  Agree with assessment and plan as noted above.  Changes made to the above note as needed.  Patient seen and independently examined with Lyda Jester, PA .   We discussed all aspects of the encounter. I agree with the assessment and plan as stated above.  Patient ws asleep today. Discussed with wife. Eats a fairly good diet - tries to limit his salt. Is very volume overloaded  Is diuresing well. Continue current lasix dose.   Continue current plan  Wife  says baseline weight is in the 258 range.    I have spent a total of 40 minutes with patient reviewing hospital  notes , telemetry, EKGs, labs and examining patient as well as establishing an assessment and plan that was discussed with the patient. > 50% of time was spent in direct patient care.    Thayer Headings, Brooke Bonito., MD, [facility] 07/28/2016, 2:27 PM [address],  [place] Pager [phone]

## 2016-07-28 NOTE — Progress Notes (Signed)
Initial Nutrition Assessment  DOCUMENTATION CODES:   Obesity unspecified  INTERVENTION:  - RD will continue to monitor for nutrition-related needs and will provide low Na diet education prior to d/c.  NUTRITION DIAGNOSIS:   Limited adherence to nutrition-related recommendations related to chronic illness as evidenced by other (see comment) (per rounds).  GOAL:   Patient will meet greater than or equal to 90% of their needs  MONITOR:   PO intake, Weight trends, Labs, I & O's  REASON FOR ASSESSMENT:   Malnutrition Screening Tool, Consult Assessment of nutrition requirement/status  ASSESSMENT:   70 y.o. male with medical history significant of non-ischemic cardiomyopathy, chronic systolic CHF EF 81-19%, A-fib, Prolonged Q-T interval, HTN, HLD, COPD on 2 L O2 at home PRN, tobacco abuse, DM Type 2 uncontrolled with complication, and chronic pain. Presented with dyspnea and wheezing started around 7 PM. He has had intermittent fevers for the past few days which was subjective as he does not check his temperature. He has been having productive cough with thick yellow sputum. On arrival to ED he was having wheezing and nebulizer treatment was given after which the wheezing has improved. Patient endorses that he has been retaining more fluid; his weight is 261 pounds which is not very different from baseline but he does endorse lower extremity swelling bilaterally as well as abdominal distention. No chest pain no nausea no vomiting no diarrhea.  Pt seen for MST and consult. BMI indicates obesity. No intakes documented since admission. Pt sleeping at time of RD visit and no family/visitors present at that time. Name call x5 was unsuccessful; pt briefly opened eyes x1 but fell right back to sleep. Information from H&P listed above related to medical hx and weight pertaining to fluid.  Unable to complete physical assessment at this time. Did not visualize any muscle or fat wasting to upper body.  RN notes indicate severe edema. Per chart review, weight +4.9 kg since 07/22/16 (118.8 kg), but consistent with weight from 07/04/16 (124.6 kg). Will monitor weight trends during admission.  Per rounds this AM, pt is noncompliant. Will provide low Na diet education prior to d/c and continue to monitor for other nutrition-related needs.   Medications reviewed; 60 mg IV Lasix BID, sliding scale Novolog, 24 units Levemir/day, PRN Zofran, 40 mEq oral KCl/day, 40 mg oral Prednisone/day, 25 mg oral Aldactone/day. Labs reviewed; Cl: 96 mmol/L, Ca: 8.8 mg/dL.   Diet Order:  Diet heart healthy/carb modified Room service appropriate? Yes; Fluid consistency: Thin  Skin:  Reviewed, no issues  Last BM:  Unknown  Height:   Ht Readings from Last 1 Encounters:  07/28/16 6' (1.829 m)    Weight:   Wt Readings from Last 1 Encounters:  07/28/16 272 lb 11.3 oz (123.7 kg)    Ideal Body Weight:  80.91 kg  BMI:  Body mass index is 36.99 kg/m.  Estimated Nutritional Needs:   Kcal:  1450-1650  Protein:  110-120 grams  Fluid:  1.2-1.5 L/day  EDUCATION NEEDS:   Education needs no appropriate at this time    Jarome Matin, MS, RD, LDN Inpatient Clinical Dietitian Pager # [phone] After hours/weekend pager # [phone]

## 2016-07-28 NOTE — Care Management Note (Signed)
Case Management Note  Patient Details  Name: Nicholas Caldwell MRN: [id number] Date of Birth: 1946-05-26  Subjective/Objective:          copd          Action/Plan:Date:  July 28, 2016 Chart reviewed for concurrent status and case management needs. Will continue to follow the patient for status change: Discharge Planning: following for needs  Copd home health heart failure screening through [facility] called to Santiago Glad with [facility]. Expected discharge date: [id number] Velva Harman, BSN, [place], [place]   Expected Discharge Date:                  Expected Discharge Plan:  Home/Self Care  In-House Referral:     Discharge planning Services     Post Acute Care Choice:    Choice offered to:     DME Arranged:    DME Agency:     HH Arranged:    [place] Agency:     Status of Service:  In process, will continue to follow  If discussed at Long Length of Stay Meetings, dates discussed:    Additional Comments:  Leeroy Cha, RN 07/28/2016, 10:30 AM

## 2016-07-28 NOTE — Progress Notes (Signed)
PT Cancellation Note  Patient Details Name: Nicholas Caldwell MRN: [id number] DOB: 12-Nov-1945   Cancelled Treatment:    Reason Eval/Treat Not Completed: PT screened. Attempted PT eval. Pt currently refusing PT services. Will sign off. Instructed wife to have MD reorder if/when pt decides he wishes to participate.    Weston Anna, MPT Pager: [phone]

## 2016-07-28 NOTE — ED Notes (Signed)
Informed pt a condom cath had been ordered.  Pt is requesting to wait until arrival on unit for placement of cath.

## 2016-07-29 ENCOUNTER — Other Ambulatory Visit: Payer: Self-pay | Admitting: *Deleted

## 2016-07-29 DIAGNOSIS — I481 Persistent atrial fibrillation: Secondary | ICD-10-CM

## 2016-07-29 LAB — BASIC METABOLIC PANEL
Anion gap: 8 (ref 5–15)
BUN: 25 mg/dL — ABNORMAL HIGH (ref 6–20)
CHLORIDE: 95 mmol/L — AB (ref 101–111)
CO2: 35 mmol/L — AB (ref 22–32)
CREATININE: 1.08 mg/dL (ref 0.61–1.24)
Calcium: 8.6 mg/dL — ABNORMAL LOW (ref 8.9–10.3)
GFR calc non Af Amer: >60 mL/min (ref >60)
Glucose, Bld: 212 mg/dL — ABNORMAL HIGH (ref 65–99)
Potassium: 4.4 mmol/L (ref 3.5–5.1)
SODIUM: 138 mmol/L (ref 135–145)

## 2016-07-29 LAB — GLUCOSE, CAPILLARY
Glucose-Capillary: 218 mg/dL — ABNORMAL HIGH (ref 65–99)
Glucose-Capillary: 211 mg/dL — ABNORMAL HIGH (ref 65–99)
Glucose-Capillary: 372 mg/dL — ABNORMAL HIGH (ref 65–99)
Glucose-Capillary: 148 mg/dL — ABNORMAL HIGH (ref 65–99)

## 2016-07-29 LAB — HEMOGLOBIN A1C
HEMOGLOBIN A1C: 10 % — AB (ref 4.8–5.6)
MEAN PLASMA GLUCOSE: 240 mg/dL

## 2016-07-29 NOTE — Progress Notes (Addendum)
PROGRESS NOTE    Nicholas Caldwell  DCV:[id number] DOB: [birth year] DOA: 07/27/2016  PCP: [facility]   Brief Narrative:  Nicholas Caldwell is a 70 y.o. male with medical history significant of non-ischemic cardiomyopathy, Chronic Systolic CHF EF 75-79%, A-fibon Xarelto,Prolonged Q-T interval , HTN, HLD, COPD on 2 L O2 at home PRN,, Tobacco Abuse, DM Type 2 uncontrolled, chronic pain on Methadonewho presents with shortness of breath which started while he was watching TV at 7pm. He was noted to be wheezing in the ER which improved after a breathing treatment. He felt he had increasing pedal edema. He was admitted for dyspnea.      Subjective: Dyspnea has improved. Has mild cough. No other symptoms.   Assessment & Plan:   Principal Problem:   PAF (paroxysmal atrial fibrillation)/ SVT/ mildly elevated troponin - SVT yesterday AM with HR in 60s - HR improved after being given oral medications- have consulted cardiology to manage cardiac issues as patient refused to allow me to manage them - currently on XArelto, Dig, Coreg-  - TSH normal  Active Problems: Dyspnea- ? Acute on chronic systolic CHF, COPD with chronic resp failure - on 2 L O2 at home - dyspnea resolved in ER after Neb treatment- CXR suggestive of pulm edema but weight unchanged from baseline and BNP actually lower than prior- however, legs quite swollen - currently received Lasix 60 mg IV Q12- cardiology assisting with management due to patient preference - cont treatment for COPD- wean Prednisone as able- on Doxycycline as well for possible acute bronchitis -  cont and O2    Essential hypertension  - controlled on current medications    Type 2 diabetes mellitus with diabetic neuropathy, with long-term current use of insulin -  Metformin on hold - cont Glucotrol as sugars, SSI and Levemir    Hyperlipidemia associated with type 2 diabetes mellitus   - Crestor    Morbid obesity  Body mass index is 39.71 kg/m.    Chronic pain - Methadone    CAD (coronary artery disease) - Non-obstructive by LHC1/16     DVT prophylaxis: Xarelto Code Status: Full code Family Communication:  Disposition Plan: home when stable in 1-2 days Consultants:   cardiology Procedures:    Antimicrobials:  Anti-infectives    Start     Dose/Rate Route Frequency Ordered Stop   07/28/16 0230  doxycycline (VIBRA-TABS) tablet 100 mg     100 mg Oral Every 12 hours 07/28/16 0215         Objective: Vitals:   07/29/16 0600 07/29/16 0800 07/29/16 0855 07/29/16 1040  BP: 138/75  (!) 140/95 113/69  Pulse: 75  77 71  Resp: _0 Temp:  98.4 F (36.9 C)  98.5 F (36.9 C)  TempSrc:  Oral  Oral  SpO2: 95%  96% 96%  Weight: 132.8 kg (292 lb 12.3 oz)     Height:        Intake/Output Summary (Last 24 hours) at 07/29/16 1336 Last data filed at 07/29/16 1000  Gross per 24 hour  Intake             1240 ml  Output             2601 ml  Net            -1361 ml   Filed Weights   07/28/16 0300 07/29/16 0600  Weight: 123.7 kg (272 lb 11.3 oz) 132.8 kg (292 lb 12.3 oz)  Examination: General exam: Appears comfortable  HEENT: PERRLA, oral mucosa moist, no sclera icterus or thrush Respiratory system: Clear to auscultation. Respiratory effort normal.- 97% on 2 L Cardiovascular system: S1 & S2 heard, RRR- No murmurs  Gastrointestinal system: Abdomen soft, non-tender, nondistended. Normal bowel sound. No organomegaly Central nervous system: Alert and oriented. No focal neurological deficits. Extremities: No cyanosis, clubbing + 2 pitting edema of legs Skin: No rashes or ulcers Psychiatry:  Mood & affect appropriate.     Data Reviewed: I have personally reviewed following labs and imaging studies  CBC:  Recent Labs Lab 07/27/16 2220 07/28/16 0319  WBC 9.9 9.5  NEUTROABS 7.0  --   HGB 11.7* 11.9*  HCT 38.1* 39.3  MCV 87.2 87.3  PLT 358 409   Basic Metabolic Panel:  Recent Labs Lab 07/27/16 2220  07/28/16 0319 07/29/16 0752  NA 141 142 138  K 4.0 3.8 4.4  CL 98* 96* 95*  CO2 35* 37* 35*  GLUCOSE 278* 230* 212*  BUN 12 12 25*  CREATININE 0.82 0.82 1.08  CALCIUM 8.5* 8.8* 8.6*  MG  --  1.8  --   PHOS  --  4.3  --    GFR: Estimated Creatinine Clearance: 92.3 mL/min (by C-G formula based on SCr of 1.08 mg/dL). Liver Function Tests:  Recent Labs Lab 07/28/16 0319  AST 15  ALT 22  ALKPHOS 108  BILITOT 0.4  PROT 7.3  ALBUMIN 3.4*   No results for input(s): LIPASE, AMYLASE in the last 168 hours. No results for input(s): AMMONIA in the last 168 hours. Coagulation Profile: No results for input(s): INR, PROTIME in the last 168 hours. Cardiac Enzymes:  Recent Labs Lab 07/28/16 0020 07/28/16 0637 07/28/16 1325  TROPONINI 0.04* 0.04* 0.04*   BNP (last 3 results)  Recent Labs  08/02/15 1714  PROBNP 363.0*   HbA1C:  Recent Labs  07/28/16 0319  HGBA1C 10.0*   CBG:  Recent Labs Lab 07/28/16 1155 07/28/16 1604 07/28/16 2317 07/29/16 0749 07/29/16 1132  GLUCAP 309* 193* 168* 218* 211*   Lipid Profile: No results for input(s): CHOL, HDL, LDLCALC, TRIG, CHOLHDL, LDLDIRECT in the last 72 hours. Thyroid Function Tests:  Recent Labs  07/28/16 0319  TSH 1.745   Anemia Panel: No results for input(s): VITAMINB12, FOLATE, FERRITIN, TIBC, IRON, RETICCTPCT in the last 72 hours. Urine analysis:    Component Value Date/Time   COLORURINE YELLOW 10/26/2014 2206   APPEARANCEUR CLEAR 10/26/2014 2206   LABSPEC 1.017 10/26/2014 2206   PHURINE 6.0 10/26/2014 2206   GLUCOSEU NEGATIVE 10/26/2014 2206   HGBUR NEGATIVE 10/26/2014 2206   BILIRUBINUR NEGATIVE 10/26/2014 2206   KETONESUR NEGATIVE 10/26/2014 2206   PROTEINUR NEGATIVE 10/26/2014 2206   UROBILINOGEN 0.2 10/26/2014 2206   NITRITE NEGATIVE 10/26/2014 2206   LEUKOCYTESUR NEGATIVE 10/26/2014 2206   Sepsis Labs: _0 (procalcitonin:4,lacticidven:4) ) Recent Results (from the past 240 hour(s))    MRSA PCR Screening     Status: None   Collection Time: 07/28/16  3:01 AM  Result Value Ref Range Status   MRSA by PCR NEGATIVE NEGATIVE Final    Comment:        The GeneXpert MRSA Assay (FDA approved for NASAL specimens only), is one component of a comprehensive MRSA colonization surveillance program. It is not intended to diagnose MRSA infection nor to guide or monitor treatment for MRSA infections.          Radiology Studies: Dg Chest 2 View  Result Date: 07/27/2016 CLINICAL DATA:  Acute onset of shortness of breath and productive cough. Mild wheezing. Initial encounter. EXAM: CHEST  2 VIEW COMPARISON:  Chest radiograph performed 06/29/2016 FINDINGS: Vascular congestion is noted. Increased interstitial markings raise concern for pulmonary edema. No definite pleural effusion or pneumothorax is seen. The heart is enlarged. No acute osseous abnormalities are identified. IMPRESSION: Vascular congestion and cardiomegaly. Increased interstitial markings raise concern for pulmonary edema. Electronically Signed   By: Garald Balding M.D.   On: 07/27/2016 22:18      Scheduled Meds: . carvedilol  25 mg Oral BID WC  . digoxin  0.125 mg Oral Daily  . doxycycline  100 mg Oral Q12H  . furosemide  60 mg Intravenous Q12H  . gabapentin  1,800 mg Oral QHS  . gabapentin  600 mg Oral Q lunch  . glipiZIDE  10 mg Oral BID AC  . guaiFENesin  600 mg Oral BID  . hydrALAZINE  25 mg Oral TID  . insulin aspart  0-15 Units Subcutaneous TID WC  . insulin aspart  0-5 Units Subcutaneous QHS  . insulin detemir  24 Units Subcutaneous QPM  . ipratropium  0.5 mg Nebulization TID  . isosorbide mononitrate  30 mg Oral Daily  . levalbuterol  0.63 mg Nebulization TID  . methadone  10 mg Oral Q12H  . mometasone-formoterol  2 puff Inhalation BID  . potassium chloride SA  40 mEq Oral Daily  . predniSONE  30 mg Oral Q breakfast  . rivaroxaban  20 mg Oral Q supper  . rosuvastatin  20 mg Oral Daily  .  sacubitril-valsartan  1 tablet Oral BID  . sodium chloride flush  3 mL Intravenous Q12H  . sodium chloride flush  3 mL Intravenous Q12H  . spironolactone  25 mg Oral Daily   Continuous Infusions:    LOS: 1 day    Time spent in minutes: 60    Mandie Crabbe, MD [facility] Pager: [email or web address] Password [email or web address] 07/29/2016, 1:36 PM

## 2016-07-29 NOTE — Progress Notes (Signed)
Patient Profile: 70 y.o.malewith history of nonischemic cardiomyopathy dx 7/35, chronic systolic HF w/ EF of 32-99%, paroxysmal atrial fib/flutter on Xarelto, Prolonged QT, HTN, HL, diabetes c/b neuropathy, chronic pain on methadone followed at pain clinic, questionable history of seizures, tobacco abuse, morbid obesity, COPD with chronic respiratory failure, admitted for COPD exacerbation, a/c systolic HF and atrial fibrillation/ flutter w/ RVR.   Subjective: Feels ok today. Still with productive cough and mild wheezing.   Objective: Vital signs in last 24 hours: Temp:  [97.8 F (36.6 C)-98.7 F (37.1 C)] 98.7 F (37.1 C) (09/26 0400) Pulse Rate:  [64-95] 75 (09/26 0600) Resp:  [12-26] 12 (09/26 0600) BP: (117-147)/(72-94) 138/75 (09/26 0600) SpO2:  [91 %-97 %] 95 % (09/26 0600) Weight:  [292 lb 12.3 oz (132.8 kg)] 292 lb 12.3 oz (132.8 kg) (09/26 0600)    Intake/Output from previous day: 09/25 0701 - 09/26 0700 In: 1400 [P.O.:1400] Out: 4101 [Urine:4100; Stool:1] Intake/Output this shift: No intake/output data recorded.  Medications Current Facility-Administered Medications  Medication Dose Route Frequency Provider Last Rate Last Dose  . 0.9 %  sodium chloride infusion  250 mL Intravenous PRN Toy Baker, MD      . acetaminophen (TYLENOL) tablet 650 mg  650 mg Oral Q6H PRN Toy Baker, MD       Or  . acetaminophen (TYLENOL) suppository 650 mg  650 mg Rectal Q6H PRN Toy Baker, MD      . carvedilol (COREG) tablet 25 mg  25 mg Oral BID WC Toy Baker, MD   25 mg at 07/29/16 0758  . diazepam (VALIUM) tablet 5 mg  5 mg Oral QHS PRN Toy Baker, MD   5 mg at 07/28/16 0258  . digoxin (LANOXIN) tablet 0.125 mg  0.125 mg Oral Daily Toy Baker, MD   0.125 mg at 07/28/16 0937  . doxycycline (VIBRA-TABS) tablet 100 mg  100 mg Oral Q12H Toy Baker, MD   100 mg at 07/28/16 2255  . furosemide (LASIX) injection 60 mg  60 mg  Intravenous Q12H Toy Baker, MD   60 mg at 07/28/16 2338  . gabapentin (NEURONTIN) capsule 1,800 mg  1,800 mg Oral QHS Toy Baker, MD   1,800 mg at 07/28/16 2255  . gabapentin (NEURONTIN) capsule 600 mg  600 mg Oral Q lunch Toy Baker, MD   600 mg at 07/28/16 1129  . glipiZIDE (GLUCOTROL) tablet 10 mg  10 mg Oral BID AC Debbe Odea, MD   10 mg at 07/29/16 0758  . guaiFENesin (MUCINEX) 12 hr tablet 600 mg  600 mg Oral BID Toy Baker, MD   600 mg at 07/28/16 2255  . hydrALAZINE (APRESOLINE) tablet 25 mg  25 mg Oral TID Toy Baker, MD   25 mg at 07/28/16 2255  . HYDROcodone-acetaminophen (NORCO/VICODIN) 5-325 MG per tablet 1-2 tablet  1-2 tablet Oral Q4H PRN Toy Baker, MD   2 tablet at 07/29/16 0813  . insulin aspart (novoLOG) injection 0-15 Units  0-15 Units Subcutaneous TID WC Toy Baker, MD   5 Units at 07/29/16 0759  . insulin aspart (novoLOG) injection 0-5 Units  0-5 Units Subcutaneous QHS Toy Baker, MD      . insulin detemir (LEVEMIR) injection 24 Units  24 Units Subcutaneous QPM Toy Baker, MD   24 Units at 07/28/16 2321  . ipratropium (ATROVENT) nebulizer solution 0.5 mg  0.5 mg Nebulization TID Toy Baker, MD   0.5 mg at 07/28/16 2132  . isosorbide mononitrate (IMDUR) 24 hr tablet  30 mg  30 mg Oral Daily Toy Baker, MD   30 mg at 07/28/16 0938  . levalbuterol (XOPENEX) nebulizer solution 0.63 mg  0.63 mg Nebulization TID Toy Baker, MD   0.63 mg at 07/28/16 2132  . methadone (DOLOPHINE) tablet 10 mg  10 mg Oral Q12H Toy Baker, MD   10 mg at 07/28/16 2255  . mometasone-formoterol (DULERA) 200-5 MCG/ACT inhaler 2 puff  2 puff Inhalation BID Toy Baker, MD   2 puff at 07/28/16 2133  . ondansetron (ZOFRAN) tablet 4 mg  4 mg Oral Q6H PRN Toy Baker, MD       Or  . ondansetron (ZOFRAN) injection 4 mg  4 mg Intravenous Q6H PRN Toy Baker, MD      . potassium chloride SA  (K-DUR,KLOR-CON) CR tablet 40 mEq  40 mEq Oral Daily Toy Baker, MD   40 mEq at 07/28/16 0939  . predniSONE (DELTASONE) tablet 30 mg  30 mg Oral Q breakfast Debbe Odea, MD   30 mg at 07/29/16 0758  . rivaroxaban (XARELTO) tablet 20 mg  20 mg Oral Q supper Toy Baker, MD   20 mg at 07/28/16 1658  . rosuvastatin (CRESTOR) tablet 20 mg  20 mg Oral Daily Toy Baker, MD   20 mg at 07/28/16 0939  . sacubitril-valsartan (ENTRESTO) 49-51 mg per tablet  1 tablet Oral BID Toy Baker, MD   1 tablet at 07/28/16 2320  . sodium chloride flush (NS) 0.9 % injection 3 mL  3 mL Intravenous Q12H Toy Baker, MD   3 mL at 07/28/16 2309  . sodium chloride flush (NS) 0.9 % injection 3 mL  3 mL Intravenous Q12H Toy Baker, MD   3 mL at 07/28/16 2308  . sodium chloride flush (NS) 0.9 % injection 3 mL  3 mL Intravenous PRN Toy Baker, MD      . spironolactone (ALDACTONE) tablet 25 mg  25 mg Oral Daily Toy Baker, MD   25 mg at 07/28/16 0940    PE: General:  A&Ox3, NAD, morbidly obese  Psych: Normal affect. Neuro: Alert and oriented X 3. Moves all extremities spontaneously. HEENT: Normal           Neck: Supple without bruits. Elevated JVD. Lungs:  Resp regular and unlabored,  Bilateral expiratory wheezing diffusely  Heart: RRR, no s3, s4, or murmurs. Abdomen: distended, non-tender, BS + x 4.  Extremities: massive 3+ bilateral LE pitting edema. DP/PT/Radials 2+ and equal bilaterally.  Lab Results:   Recent Labs  07/27/16 2220 07/28/16 0319  WBC 9.9 9.5  HGB 11.7* 11.9*  HCT 38.1* 39.3  PLT 358 372   BMET  Recent Labs  07/27/16 2220 07/28/16 0319  NA 141 142  K 4.0 3.8  CL 98* 96*  CO2 35* 37*  GLUCOSE 278* 230*  BUN 12 12  CREATININE 0.82 0.82  CALCIUM 8.5* 8.8*   Filed Weights   07/28/16 0300 07/29/16 0600  Weight: 272 lb 11.3 oz (123.7 kg) 292 lb 12.3 oz (132.8 kg)    Assessment/Plan  Principal Problem:   PAF (paroxysmal  atrial fibrillation) (HCC) Active Problems:   Essential hypertension   Type 2 diabetes mellitus with diabetic neuropathy, with long-term current use of insulin (HCC)   Hyperlipidemia associated with type 2 diabetes mellitus (HCC)   COPD exacerbation (HCC)   Acute on chronic systolic CHF (congestive heart failure) (HCC)   Morbid obesity (HCC)   Chronic pain   CAD (coronary artery disease) - Non-obstructive by  LHC1/16   Uncontrolled type 2 diabetes mellitus with complication (HCC)   Acute exacerbation of CHF (congestive heart failure) ([place])   1. Atrial  Flutter/ Fibrillation w/ RVR: Pt now in NSR. Rate is controlled in the 70s.  Continue scheduled  BB and digoxin. No amiodarone given h/o prolonged QT. Continue management of acute COPD exacerbation and a/c CHF. Continue Xarelto for anticoagulation. Continue to monitor on telemetry.    2. A/C Systolic CHF: known NICM with EF of 25-30%. He is massively volume overloaded with 3+ bilateral LE pitting edema on exam. SCR, BUN, K and BP stable. Continue IV lasix. He has a condom cath to assist with UOP measurement for strict I/Os. Excellent diuresis over the past 24 hrs. Total UOP -4L yesterday. Check daily weights. I don't think this has been accurate (increase from 272 to 292 from yesterday, assume error). Will discuss weights with RN. Pt reports baseline weight is ~258 lb. Continue diuresis. Monitor K closely to prevent hypokalemia given h/o prolonged QT. Check BMP today.  Monitor renal function. Low sodium diet. He may need a RHC after diuresis to insure that he is adequately diuresed prior to discharge. Agree that he would benefit from referral to [facility] post discharge.   3. NICM: EF 25-30%. Normal cath 11/2015. Per records, he refused ICD in the past. Continue medial  therapy with Entreso, BB, Imdur, hydralazine, spironolactone and digoxin.  Would also recommend changing BB from Coreg to a more cardioselective BB such as long acting  metoprolol, given concomitant COPD and wheezing on physical exam. Metoprolol would also provide better rate control for his afib.   4. Acute COPD Exacerbation: continue Abx, steroids and breathing treatments. Management per IM. Recommend changing from Coreg to cardioselective BB given COPD + wheezing.   5. HTN: under better control today controlled. Continue BB, ARB (Entresto), hydralazine and nitrate. Monitor closely. Low sodium diet.   6. T2DM: management per IM.   7. H/o Prolonged QT: QT/QTc 344/510 ms on recent EKG. No longer on amiodarone. K WNL.   8. Chronic Pain: IM managing methadone.     LOS: 1 day    Brittainy M. Ladoris Gene 07/29/2016 8:22 AM  Attending Note:   The patient was seen and examined.  Agree with assessment and plan as noted above.  Changes made to the above note as needed.  Patient seen and independently examined with Lyda Jester, PA .   We discussed all aspects of the encounter. I agree with the assessment and plan as stated above.  1. Chronic systolic CHF Pt is diuresing well.   Still has lots of leg edema  Continue current plan  Continue current meds. I think his wheezing is better.  As noted above by Brittainy, we could consider changing to toprol XL if he has wheezing after he is adequately diuresed.  Would continue with coreg for now   2. Persistent atrial fib:  Stable ,   3.    I have spent a total of 30 minutes with patient reviewing hospital  notes , telemetry, EKGs, labs and examining patient as well as establishing an assessment and plan that was discussed with the patient. > 50% of time was spent in direct patient care.    Thayer Headings, Brooke Bonito., MD, [facility] 07/29/2016, 10:33 AM [address],  [place] Pager [phone]

## 2016-07-29 NOTE — Progress Notes (Signed)
OT Cancellation Note  Patient Details Name: Nicholas Caldwell MRN: [id number] DOB: 1946-04-06   Cancelled Treatment:    Reason Eval/Treat Not Completed: Patient declined, no reason specified  Pt agreed for OT to return next day for eval.   [name], [name] 07/29/2016, 2:15 PM

## 2016-07-29 NOTE — Consult Note (Signed)
   [facility] Inpatient Consult   07/29/2016  Nicholas Caldwell [birth year] [id number]     Patient screened for long-term disease management services with [place] [facility] program. Martin Majestic to bedside to discuss and offer [place] [facility] services. [name] is agreeable and written consent signed. Explained to [name] that he will receive post hospital transition of care calls and will be evaluated for monthly home visits. [facility] but [facility]. Therefore he is eligible for [facility] Care Management program.  Confirmed best contact number as [phone]. Explained that [place] Management will not interfere or replace services provided by home health.  Left [facility] Care Management packet and contact information at bedside. Made inpatient RNCM aware that patient will be followed by [place] Management post hospital discharge.  [name] endorses that he lives with his wife. Denies having trouble with transportation or with medications. Obtains his meds thru the [place]. He weighs daily and states " I take an extra lasix if my weight is over 3 pds". He has more questions regarding COPD disease and symptom management. He has had x3 admits in past 6 months and has history of CHF, COPD, AFIB, DM, HTN, HLD. Will request to be assigned to [place] for follow up.   Marthenia Rolling, MSN-Ed, RN,BSN [facility] Liaison [phone]

## 2016-07-30 DIAGNOSIS — I2510 Atherosclerotic heart disease of native coronary artery without angina pectoris: Secondary | ICD-10-CM

## 2016-07-30 DIAGNOSIS — I509 Heart failure, unspecified: Secondary | ICD-10-CM

## 2016-07-30 LAB — BASIC METABOLIC PANEL
Anion gap: 7 (ref 5–15)
BUN: 20 mg/dL (ref 6–20)
CHLORIDE: 96 mmol/L — AB (ref 101–111)
CO2: 38 mmol/L — ABNORMAL HIGH (ref 22–32)
Calcium: 8.3 mg/dL — ABNORMAL LOW (ref 8.9–10.3)
Creatinine, Ser: 0.85 mg/dL (ref 0.61–1.24)
GFR calc Af Amer: >60 mL/min (ref >60)
GLUCOSE: 166 mg/dL — AB (ref 65–99)
POTASSIUM: 3.8 mmol/L (ref 3.5–5.1)
Sodium: 141 mmol/L (ref 135–145)

## 2016-07-30 LAB — GLUCOSE, CAPILLARY
GLUCOSE-CAPILLARY: 248 mg/dL — AB (ref 65–99)
GLUCOSE-CAPILLARY: 293 mg/dL — AB (ref 65–99)
Glucose-Capillary: 134 mg/dL — ABNORMAL HIGH (ref 65–99)
Glucose-Capillary: 303 mg/dL — ABNORMAL HIGH (ref 65–99)

## 2016-07-30 MED ORDER — FUROSEMIDE 10 MG/ML IJ SOLN
80.0000 mg | Freq: Three times a day (TID) | INTRAMUSCULAR | Status: DC
  Administered 2016-07-30 – 2016-08-04 (×15): 80 mg via INTRAVENOUS
  Filled 2016-07-30 (×15): qty 8

## 2016-07-30 MED ORDER — INSULIN ASPART 100 UNIT/ML ~~LOC~~ SOLN
2.0000 [IU] | Freq: Three times a day (TID) | SUBCUTANEOUS | Status: DC
  Administered 2016-07-30 – 2016-08-02 (×8): 2 [IU] via SUBCUTANEOUS

## 2016-07-30 NOTE — Progress Notes (Signed)
PROGRESS NOTE    Nicholas Caldwell  WCH:[id number] DOB: 1945-11-08 DOA: 07/27/2016  PCP: [facility]   Brief Narrative:  Nicholas Caldwell is a 70 y.o. male with medical history significant of non-ischemic cardiomyopathy, Chronic Systolic CHF EF 35-36%, A-fibon Xarelto,Prolonged Q-T interval , HTN, HLD, COPD on 2 L O2 at home PRN,, Tobacco Abuse, DM Type 2 uncontrolled, chronic pain on Methadonewho presents with shortness of breath which started while he was watching TV at 7pm. He was noted to be wheezing in the ER which improved after a breathing treatment. He felt he had increasing pedal edema. He was admitted for dyspnea.      Subjective: Reports persistent cough.   Assessment & Plan:   Principal Problem:   PAF (paroxysmal atrial fibrillation)/ SVT/ mildly elevated troponin In sinus, cardiology consulted and recommendations given.  - currently on XArelto, Dig, Coreg-  - TSH normal   Dyspnea- ? Acute on chronic systolic CHF, COPD with chronic resp failure - on 2 L O2 at home - on diuresis with IV lasix TID/  -I/O last 3 completed shifts: In: 2100 [P.O.:2100] Out: 1443 [Urine:3225; Stool:1] Total I/O In: 840 [P.O.:840] Out: 3825 [Urine:3825]  DOXY for bronchitis.       Essential hypertension  - controlled on current medications    Type 2 diabetes mellitus with diabetic neuropathy, with long-term current use of insulin -  Metformin on hold - cont Glucotrol as sugars, SSI and Levemir - CBG (last 3)   Recent Labs  07/29/16 2108 07/30/16 0709 07/30/16 1155  GLUCAP 148* 134* 303*        Hyperlipidemia associated with type 2 diabetes mellitus   - Crestor    Morbid obesity  Body mass index is 40.48 kg/m.    Chronic pain - Methadone    CAD (coronary artery disease) - Non-obstructive by LHC1/16     DVT prophylaxis: Xarelto Code Status: Full code Family Communication: family at bedside sleeping.  Disposition Plan: home when stable in 1-2  days Consultants:   cardiology Procedures:    Antimicrobials:  Anti-infectives    Start     Dose/Rate Route Frequency Ordered Stop   07/28/16 0230  doxycycline (VIBRA-TABS) tablet 100 mg     100 mg Oral Every 12 hours 07/28/16 0215         Objective: Vitals:   07/30/16 0500 07/30/16 0704 07/30/16 0826 07/30/16 1413  BP:  126/79  120/74  Pulse:  85  91  Resp:  20  20  Temp:  98.1 F (36.7 C)  98.4 F (36.9 C)  TempSrc:  Axillary  Oral  SpO2:  100% 93% 98%  Weight: 135.4 kg (298 lb 8.1 oz)     Height:        Intake/Output Summary (Last 24 hours) at 07/30/16 1440 Last data filed at 07/30/16 1424  Gross per 24 hour  Intake             1800 ml  Output             5225 ml  Net            -3425 ml   Filed Weights   07/28/16 0300 07/29/16 0600 07/30/16 0500  Weight: 123.7 kg (272 lb 11.3 oz) 132.8 kg (292 lb 12.3 oz) 135.4 kg (298 lb 8.1 oz)    Examination: General exam: Appears comfortable  HEENT: PERRLA, oral mucosa moist, no sclera icterus or thrush Respiratory system: Clear to auscultation. Respiratory effort normal.- 97% on  2 L Cardiovascular system: S1 & S2 heard, RRR- No murmurs  Gastrointestinal system: Abdomen soft, non-tender, nondistended. Normal bowel sound. No organomegaly Central nervous system: Alert and oriented. No focal neurological deficits. Extremities: No cyanosis, clubbing + 2 pitting edema of legs Skin: No rashes or ulcers Psychiatry:  Mood & affect appropriate.     Data Reviewed: I have personally reviewed following labs and imaging studies  CBC:  Recent Labs Lab 07/27/16 2220 07/28/16 0319  WBC 9.9 9.5  NEUTROABS 7.0  --   HGB 11.7* 11.9*  HCT 38.1* 39.3  MCV 87.2 87.3  PLT 358 381   Basic Metabolic Panel:  Recent Labs Lab 07/27/16 2220 07/28/16 0319 07/29/16 0752 07/30/16 0507  NA 141 142 138 141  K 4.0 3.8 4.4 3.8  CL 98* 96* 95* 96*  CO2 35* 37* 35* 38*  GLUCOSE 278* 230* 212* 166*  BUN 12 12 25* 20  CREATININE  0.82 0.82 1.08 0.85  CALCIUM 8.5* 8.8* 8.6* 8.3*  MG  --  1.8  --   --   PHOS  --  4.3  --   --    GFR: Estimated Creatinine Clearance: 118.5 mL/min (by C-G formula based on SCr of 0.85 mg/dL). Liver Function Tests:  Recent Labs Lab 07/28/16 0319  AST 15  ALT 22  ALKPHOS 108  BILITOT 0.4  PROT 7.3  ALBUMIN 3.4*   No results for input(s): LIPASE, AMYLASE in the last 168 hours. No results for input(s): AMMONIA in the last 168 hours. Coagulation Profile: No results for input(s): INR, PROTIME in the last 168 hours. Cardiac Enzymes:  Recent Labs Lab 07/28/16 0020 07/28/16 0637 07/28/16 1325  TROPONINI 0.04* 0.04* 0.04*   BNP (last 3 results)  Recent Labs  08/02/15 1714  PROBNP 363.0*   HbA1C:  Recent Labs  07/28/16 0319  HGBA1C 10.0*   CBG:  Recent Labs Lab 07/29/16 1132 07/29/16 1631 07/29/16 2108 07/30/16 0709 07/30/16 1155  GLUCAP 211* 372* 148* 134* 303*   Lipid Profile: No results for input(s): CHOL, HDL, LDLCALC, TRIG, CHOLHDL, LDLDIRECT in the last 72 hours. Thyroid Function Tests:  Recent Labs  07/28/16 0319  TSH 1.745   Anemia Panel: No results for input(s): VITAMINB12, FOLATE, FERRITIN, TIBC, IRON, RETICCTPCT in the last 72 hours. Urine analysis:    Component Value Date/Time   COLORURINE YELLOW 10/26/2014 2206   APPEARANCEUR CLEAR 10/26/2014 2206   LABSPEC 1.017 10/26/2014 2206   PHURINE 6.0 10/26/2014 2206   GLUCOSEU NEGATIVE 10/26/2014 2206   HGBUR NEGATIVE 10/26/2014 2206   BILIRUBINUR NEGATIVE 10/26/2014 2206   KETONESUR NEGATIVE 10/26/2014 2206   PROTEINUR NEGATIVE 10/26/2014 2206   UROBILINOGEN 0.2 10/26/2014 2206   NITRITE NEGATIVE 10/26/2014 2206   LEUKOCYTESUR NEGATIVE 10/26/2014 2206   Sepsis Labs: _0 (procalcitonin:4,lacticidven:4) ) Recent Results (from the past 240 hour(s))  MRSA PCR Screening     Status: None   Collection Time: 07/28/16  3:01 AM  Result Value Ref Range Status   MRSA by PCR NEGATIVE  NEGATIVE Final    Comment:        The GeneXpert MRSA Assay (FDA approved for NASAL specimens only), is one component of a comprehensive MRSA colonization surveillance program. It is not intended to diagnose MRSA infection nor to guide or monitor treatment for MRSA infections.          Radiology Studies: No results found.    Scheduled Meds: . carvedilol  25 mg Oral BID WC  . digoxin  0.125 mg  Oral Daily  . doxycycline  100 mg Oral Q12H  . furosemide  80 mg Intravenous TID AC  . gabapentin  1,800 mg Oral QHS  . gabapentin  600 mg Oral Q lunch  . glipiZIDE  10 mg Oral BID AC  . guaiFENesin  600 mg Oral BID  . hydrALAZINE  25 mg Oral TID  . insulin aspart  0-15 Units Subcutaneous TID WC  . insulin aspart  0-5 Units Subcutaneous QHS  . insulin aspart  2 Units Subcutaneous TID WC  . insulin detemir  24 Units Subcutaneous QPM  . ipratropium  0.5 mg Nebulization TID  . isosorbide mononitrate  30 mg Oral Daily  . levalbuterol  0.63 mg Nebulization TID  . methadone  10 mg Oral Q12H  . mometasone-formoterol  2 puff Inhalation BID  . potassium chloride SA  40 mEq Oral Daily  . predniSONE  30 mg Oral Q breakfast  . rivaroxaban  20 mg Oral Q supper  . rosuvastatin  20 mg Oral Daily  . sacubitril-valsartan  1 tablet Oral BID  . sodium chloride flush  3 mL Intravenous Q12H  . sodium chloride flush  3 mL Intravenous Q12H  . spironolactone  25 mg Oral Daily   Continuous Infusions:    LOS: 2 days    Time spent in minutes: 50    Reita Shindler, MD [facility] Pager: [phone] [email or web address] Password [email or web address] 07/30/2016, 2:40 PM

## 2016-07-30 NOTE — Progress Notes (Signed)
Patient Name: Nicholas Caldwell Date of Encounter: 07/30/2016  Primary Cardiologist: Lendell Caprice, MD / J. Allred, MD   Hospital Problem List     Principal Problem:   PAF (paroxysmal atrial fibrillation) (HCC) Active Problems:   COPD exacerbation (HCC)   Acute on chronic systolic CHF (congestive heart failure) ([place])   Essential hypertension   Type 2 diabetes mellitus with diabetic neuropathy, with long-term current use of insulin (HCC)   Morbid obesity (HCC)   Chronic pain   Hyperlipidemia associated with type 2 diabetes mellitus (HCC)   CAD (coronary artery disease) - Non-obstructive by LHC1/16   Uncontrolled type 2 diabetes mellitus with complication (HCC)   Subjective   Breathing improving.  Swelling less. No chest pain.  Has been in fib/flutter since late last night - asymptomatic.  Inpatient Medications    . carvedilol  25 mg Oral BID WC  . digoxin  0.125 mg Oral Daily  . doxycycline  100 mg Oral Q12H  . furosemide  60 mg Intravenous Q12H  . gabapentin  1,800 mg Oral QHS  . gabapentin  600 mg Oral Q lunch  . glipiZIDE  10 mg Oral BID AC  . guaiFENesin  600 mg Oral BID  . hydrALAZINE  25 mg Oral TID  . insulin aspart  0-15 Units Subcutaneous TID WC  . insulin aspart  0-5 Units Subcutaneous QHS  . insulin detemir  24 Units Subcutaneous QPM  . ipratropium  0.5 mg Nebulization TID  . isosorbide mononitrate  30 mg Oral Daily  . levalbuterol  0.63 mg Nebulization TID  . methadone  10 mg Oral Q12H  . mometasone-formoterol  2 puff Inhalation BID  . potassium chloride SA  40 mEq Oral Daily  . predniSONE  30 mg Oral Q breakfast  . rivaroxaban  20 mg Oral Q supper  . rosuvastatin  20 mg Oral Daily  . sacubitril-valsartan  1 tablet Oral BID  . sodium chloride flush  3 mL Intravenous Q12H  . sodium chloride flush  3 mL Intravenous Q12H  . spironolactone  25 mg Oral Daily    Vital Signs    Vitals:   07/29/16 2129 07/30/16 0500 07/30/16 0704 07/30/16 0826  BP: 130/76   126/79   Pulse: 68  85   Resp: 20  20   Temp: 97.8 F (36.6 C)  98.1 F (36.7 C)   TempSrc: Oral  Axillary   SpO2: 99%  100% 93%  Weight:  298 lb 8.1 oz (135.4 kg)    Height:        Intake/Output Summary (Last 24 hours) at 07/30/16 1136 Last data filed at 07/30/16 0825  Gross per 24 hour  Intake             1320 ml  Output             4400 ml  Net            -3080 ml   Filed Weights   07/28/16 0300 07/29/16 0600 07/30/16 0500  Weight: 272 lb 11.3 oz (123.7 kg) 292 lb 12.3 oz (132.8 kg) 298 lb 8.1 oz (135.4 kg)    Physical Exam   GEN: Well nourished, well developed, in no acute distress.  HEENT: Grossly normal.  Neck: Supple, obese, difficult to gauge jvp.  No carotid bruits, or masses. Cardiac: IR, IR, distant, no murmurs, rubs, or gallops. No clubbing, cyanosis, 2-3+ bilat LE edema to calves.  Radials/DP/PT 2+ and equal bilaterally.  Respiratory:  Respirations regular and unlabored, clear to auscultation bilaterally. GI: obese, protuberant, nontender, BS + x 4. MS: no deformity or atrophy. Skin: warm and dry, no rash. Neuro:  Strength and sensation are intact. Psych: AAOx3.  Normal affect.  Labs    CBC  Recent Labs  07/27/16 2220 07/28/16 0319  WBC 9.9 9.5  NEUTROABS 7.0  --   HGB 11.7* 11.9*  HCT 38.1* 39.3  MCV 87.2 87.3  PLT 358 128   Basic Metabolic Panel  Recent Labs  07/28/16 0319 07/29/16 0752 07/30/16 0507  NA 142 138 141  K 3.8 4.4 3.8  CL 96* 95* 96*  CO2 37* 35* 38*  GLUCOSE 230* 212* 166*  BUN 12 25* 20  CREATININE 0.82 1.08 0.85  CALCIUM 8.8* 8.6* 8.3*  MG 1.8  --   --   PHOS 4.3  --   --    Liver Function Tests  Recent Labs  07/28/16 0319  AST 15  ALT 22  ALKPHOS 108  BILITOT 0.4  PROT 7.3  ALBUMIN 3.4*   Cardiac Enzymes  Recent Labs  07/28/16 0020 07/28/16 0637 07/28/16 1325  TROPONINI 0.04* 0.04* 0.04*   Hemoglobin A1C  Recent Labs  07/28/16 0319  HGBA1C 10.0*   Thyroid Function Tests  Recent Labs   07/28/16 0319  TSH 1.745    Telemetry    Afib/flutter since ~ 2200 9/26, currently rate-controlled aflutter.  Radiology    No results found.  Patient Profile     70 y.o. male with history of nonischemic cardiomyopathy dx 7/86, chronic systolic HF w/ EF of 76-72%, paroxysmal atrial fib/flutter on Xarelto, Prolonged QT, HTN, HL, diabetes c/b neuropathy, chronic pain on methadone followed at pain clinic, questionable history of seizures, tobacco abuse, morbid obesity, COPD with chronic respiratory failure, admitted for COPD exacerbation, a/c systolic HF and atrial fibrillation/ flutter w/ RVR.    Assessment & Plan    1.  Acute on chronic systolic CHF/NICM:  EF 09-47% by echo in 06/2016.  Massively volume overloaded on admission.  Diuresing well.   Neg neg 6.4L since admission, though wt somehow listed as up 26 lbs since 9/25 and 6 lbs since 9/26.  Suspect admission wt was incorrect as they stood him on the scale this AM.  Cont IV diuresis.  Creat stable so will increase dosing.  He was on 80 PO TID and is only currently on 60 IV BID.  Will change to 80 TID for more rapid diuresis.  Cont  blocker, ARNI, spiro, hydral, nitrate, and digoxin.  Prev declined ICD.  2.  PAF/Flutter:  Asymptomatic.  Back in fib last night, flutter this AM.  Cont  blocker, digoxin, xarelto.  3.  Hypertensive heart dzs:  Stable on  blocker, ARNI, spriro, hydral, nitrate.  4.  HL:  Cont crestor.  Last LDL in epic was 72 in 10/2014.  5.  DM II:  Poorly controlled w/ A1c of 10.  Mgmt per IM.  6.  Chronic Pain:  On methadone.  7. Elevated trop:  In setting of CHF.  Mild elevation and flat trend likely represents demand ischemia.  Cath in 11/2014 showed nonobs dzs.  No plan for repeat ischemic eval @ this time.  Signed, Murray Hodgkins NP 07/30/2016, 11:36 AM   Attending Note:   The patient was seen and examined.  Agree with assessment and plan as noted above.  Changes made to the above note as  needed.  Patient seen and independently examined with Ignacia Bayley,  NP.   We discussed all aspects of the encounter. I agree with the assessment and plan as stated above.  1. Chronic systolic CHF:  Making progress. Continues to diurese. Lasix has been increased to TID  Still has significant leg edema   2. Atrial fib: Has gone back into NSR by this afternoon.  Continue to follow     I have spent a total of 40 minutes with patient reviewing hospital  notes , telemetry, EKGs, labs and examining patient as well as establishing an assessment and plan that was discussed with the patient. > 50% of time was spent in direct patient care.    Thayer Headings, Brooke Bonito., MD, [facility] 07/30/2016, 1:12 PM [address],  [place] Pager [phone]

## 2016-07-30 NOTE — Progress Notes (Addendum)
Inpatient Diabetes Program Recommendations  AACE/ADA: New Consensus Statement on Inpatient Glycemic Control (2015)  Target Ranges:  Prepandial:   less than 140 mg/dL      Peak postprandial:   less than 180 mg/dL (1-2 hours)      Critically ill patients:  140 - 180 mg/dL   Results for TODRICK, SIEDSCHLAG (MRN [id number]) as of 07/30/2016 12:52  Ref. Range 07/29/2016 07:49 07/29/2016 11:32 07/29/2016 16:31 07/29/2016 21:08  Glucose-Capillary Latest Ref Range: 65 - 99 mg/dL 218 (H) 211 (H) 372 (H) 148 (H)   Results for RIVER, AMBROSIO (MRN [id number]) as of 07/30/2016 12:52  Ref. Range 07/30/2016 07:09 07/30/2016 11:55  Glucose-Capillary Latest Ref Range: 65 - 99 mg/dL 134 (H) 303 (H)    Home DM Meds: Glipizide 10 mg bid       Metformin 1000 mg bid       Levemir 24 units QPM  Current Insulin Orders: Levemir 24 units QHS      Novolog Moderate Correction Scale/ SSI (0-15 units) TID AC + HS      Glipizide 10 mg bid      -Patient currently getting Prednisone 30 mg daily.  -Having issues with elevated postprandial glucose levels.  -Eating 100% of meals.     MD- Please consider the following in-hospital insulin adjustments while patient getting Prednisone:  Start Novolog Meal Coverage: Novolog 4 units tid with meals (hold if pt eats <50% of meal)      --Will follow patient during hospitalization--  Wyn Quaker RN, MSN, CDE Diabetes Coordinator Inpatient Glycemic Control Team Team Pager: [phone] (8a-5p)

## 2016-07-30 NOTE — Progress Notes (Signed)
Occupational Therapy Evaluation Patient Details Name: Nicholas Caldwell MRN: [id number] DOB: July 09, 1946 Today's Date: 07/30/2016    History of Present Illness 70 y.o. male with medical history significant of non-ischemic cardiomyopathy, Chronic Systolic CHF EF 74-14%,  A-fib on Xarelto,Prolonged Q-T interval , HTN, HLD, COPD on 2 L O2 at home PRN,, Tobacco Abuse, DM Type 2 uncontrolled, chronic pain on Methadone who presents with shortness of breath which started while he was watching TV   Clinical Impression   Patient participated in OT evaluation, but reports he was "just putting on a show" because he "does not want any further therapy while in the hospital." He does require assistance with mobility and ADLs but states that his wife can assist him. Recommend 24/7 S/A at discharge. OT will sign off per pt request.    Follow Up Recommendations  Supervision/Assistance - 24 hour    Equipment Recommendations  None recommended by OT    Recommendations for Other Services PT consult     Precautions / Restrictions Precautions Precautions: Fall Precaution Comments: monitor O2 sats Restrictions Weight Bearing Restrictions: No      Mobility Bed Mobility Overal bed mobility: Needs Assistance Bed Mobility: Supine to Sit     Supine to sit: Min guard;HOB elevated     General bed mobility comments: close guard for safety  Transfers Overall transfer level: Needs assistance Equipment used: Rolling walker (2 wheeled) Transfers: Sit to/from Stand Sit to Stand: Min assist              Balance                                            ADL Overall ADL's : Needs assistance/impaired Eating/Feeding: Independent;Sitting                   Lower Body Dressing: Total assistance Lower Body Dressing Details (indicate cue type and reason): pull up socks Toilet Transfer: Minimal assistance;Ambulation;RW           Functional mobility during ADLs: Minimal  assistance;Rolling walker General ADL Comments: Patient agreeable to OT eval only and reports that he will not do any further therapy while here. He ambulated with RW in room/hall. He does need assistance with ADLs but reports his wife can assist him as needed.     Vision     Perception     Praxis      Pertinent Vitals/Pain Pain Assessment: 0-10 Pain Score: 5  Pain Location: hips and back Pain Descriptors / Indicators: Aching;Sore Pain Intervention(s): Limited activity within patient's tolerance;Repositioned     Hand Dominance     Extremity/Trunk Assessment Upper Extremity Assessment Upper Extremity Assessment: Overall WFL for tasks assessed   Lower Extremity Assessment Lower Extremity Assessment: Defer to PT evaluation   Cervical / Trunk Assessment Cervical / Trunk Assessment: Kyphotic   Communication Communication Communication: No difficulties   Cognition Arousal/Alertness: Awake/alert Behavior During Therapy: WFL for tasks assessed/performed Overall Cognitive Status: Within Functional Limits for tasks assessed                     General Comments       Exercises       Shoulder Instructions      Home Living Family/patient expects to be discharged to:: Private residence Living Arrangements: Spouse/significant other Available Help at Discharge: Family Type of Home: House  Bathroom Shower/Tub: Teacher, [name]: Standard Bathroom Accessibility: Yes How Accessible: Accessible via walker Home Equipment: Conde - 2 wheels;Cane - single point;Bedside commode;Shower seat   Additional Comments: home oxygen      Prior Functioning/Environment Level of Independence: Independent with assistive device(s)        Comments: reports occasional assistance needed with BADLs        OT Problem List: Decreased strength;Decreased activity tolerance;Decreased safety awareness;Decreased knowledge of use of DME or AE;Decreased  knowledge of precautions;Cardiopulmonary status limiting activity;Pain   OT Treatment/Interventions:      OT Goals(Current goals can be found in the care plan section) Acute Rehab OT Goals Patient Stated Goal: no more therapy OT Goal Formulation: All assessment and education complete, DC therapy  OT Frequency:     Barriers to D/C:            Co-evaluation              End of Session Equipment Utilized During Treatment: Rolling walker;Oxygen Nurse Communication: Mobility status  Activity Tolerance: Patient tolerated treatment well Patient left: in chair;with call bell/phone within reach;with family/visitor present   Time: 1020-1039 OT Time Calculation (min): 19 min Charges:  OT General Charges $OT Visit: 1 Procedure OT Evaluation $OT Eval Low Complexity: 1 Procedure G-Codes:    Nicholas Caldwell A 08/16/2016, 12:16 PM

## 2016-07-31 LAB — GLUCOSE, CAPILLARY
GLUCOSE-CAPILLARY: 161 mg/dL — AB (ref 65–99)
GLUCOSE-CAPILLARY: 138 mg/dL — AB (ref 65–99)
Glucose-Capillary: 290 mg/dL — ABNORMAL HIGH (ref 65–99)
Glucose-Capillary: 188 mg/dL — ABNORMAL HIGH (ref 65–99)

## 2016-07-31 NOTE — Progress Notes (Signed)
PT Cancellation Note  Patient Details Name: Nicholas Caldwell MRN: [id number] DOB: 01-Apr-1946   Cancelled Treatment:    Reason Eval/Treat Not Completed: PT screened, no needs identified, will sign off . Spoke with pt previously on 9/25-he refused PT services. OT evaluated pt on 9/27-please see note-pt continues to state he does not wish to participate with therapy in hospital. Will sign off per pt request.    Weston Anna, MPT Pager: [phone]

## 2016-07-31 NOTE — Progress Notes (Signed)
PROGRESS NOTE    Nicholas Caldwell  WUJ:[id number] DOB: November 09, 1945 DOA: 07/27/2016  PCP: [facility]   Brief Narrative:  Nicholas Caldwell is a 70 y.o. male with medical history significant of non-ischemic cardiomyopathy, Chronic Systolic CHF EF 95-62%, A-fibon Xarelto,Prolonged Q-T interval , HTN, HLD, COPD on 2 L O2 at home PRN,, Tobacco Abuse, DM Type 2 uncontrolled, chronic pain on Methadonewho presents with shortness of breath which started while he was watching TV at 7pm. He was noted to be wheezing in the ER which improved after a breathing treatment. He felt he had increasing pedal edema. He was admitted for dyspnea.      Subjective: Wants to know when he can go home.   Assessment & Plan:   Principal Problem:   PAF (paroxysmal atrial fibrillation)/ SVT/ mildly elevated troponin Rate controlled aflutter cardiology consulted and recommendations given.  - currently on XArelto, Dig, Coreg-  - TSH normal   Dyspnea- ? Acute on chronic systolic CHF, COPD with chronic resp failure - on 2 L O2 at home - on diuresis with IV lasix TID/  -I/O last 3 completed shifts: In: 64 [P.O.:1560] Out: 1308 [Urine:6325] Total I/O In: 45 [P.O.:720] Out: 4500 [Urine:4500]  DOXY for bronchitis.  Resume IV lasix for further diuresis.      Essential hypertension  - controlled on current medications    Type 2 diabetes mellitus with diabetic neuropathy, with long-term current use of insulin -  Metformin on hold - cont Glucotrol as sugars, SSI and Levemir - CBG (last 3)   Recent Labs  07/31/16 0724 07/31/16 1146 07/31/16 1649  GLUCAP 161* 138* 290*        Hyperlipidemia associated with type 2 diabetes mellitus   - Crestor    Morbid obesity  Body mass index is 39.92 kg/m.    Chronic pain - Methadone    CAD (coronary artery disease) - Non-obstructive by LHC1/16     DVT prophylaxis: Xarelto Code Status: Full code Family Communication: family at bedside sleeping.    Disposition Plan: home when stable in 1-2 days Consultants:   cardiology Procedures:    Antimicrobials:  Anti-infectives    Start     Dose/Rate Route Frequency Ordered Stop   07/28/16 0230  doxycycline (VIBRA-TABS) tablet 100 mg     100 mg Oral Every 12 hours 07/28/16 0215         Objective: Vitals:   07/31/16 0657 07/31/16 0853 07/31/16 1024 07/31/16 1353  BP: 132/74   (!) 129/51  Pulse: 88   (!) 59  Resp: 20   18  Temp: 98.2 F (36.8 C)   98.3 F (36.8 C)  TempSrc: Oral   Oral  SpO2: 96% 97% 98% 95%  Weight: 133.5 kg (294 lb 5 oz)     Height:        Intake/Output Summary (Last 24 hours) at 07/31/16 1810 Last data filed at 07/31/16 1624  Gross per 24 hour  Intake              960 ml  Output             6000 ml  Net            -5040 ml   Filed Weights   07/29/16 0600 07/30/16 0500 07/31/16 0657  Weight: 132.8 kg (292 lb 12.3 oz) 135.4 kg (298 lb 8.1 oz) 133.5 kg (294 lb 5 oz)    Examination: General exam: Appears comfortable  HEENT: PERRLA, oral mucosa moist,  no sclera icterus or thrush Respiratory system: Clear to auscultation. Respiratory effort normal.- 97% on 2 L Cardiovascular system: S1 & S2 heard, RRR- No murmurs  Gastrointestinal system: Abdomen soft, non-tender, nondistended. Normal bowel sound. No organomegaly Central nervous system: Alert and oriented. No focal neurological deficits. Extremities: No cyanosis, clubbing + 2 pitting edema of legs Skin: No rashes or ulcers Psychiatry:  Mood & affect appropriate.     Data Reviewed: I have personally reviewed following labs and imaging studies  CBC:  Recent Labs Lab 07/27/16 2220 07/28/16 0319  WBC 9.9 9.5  NEUTROABS 7.0  --   HGB 11.7* 11.9*  HCT 38.1* 39.3  MCV 87.2 87.3  PLT 358 562   Basic Metabolic Panel:  Recent Labs Lab 07/27/16 2220 07/28/16 0319 07/29/16 0752 07/30/16 0507  NA 141 142 138 141  K 4.0 3.8 4.4 3.8  CL 98* 96* 95* 96*  CO2 35* 37* 35* 38*  GLUCOSE 278*  230* 212* 166*  BUN 12 12 25* 20  CREATININE 0.82 0.82 1.08 0.85  CALCIUM 8.5* 8.8* 8.6* 8.3*  MG  --  1.8  --   --   PHOS  --  4.3  --   --    GFR: Estimated Creatinine Clearance: 117.6 mL/min (by C-G formula based on SCr of 0.85 mg/dL). Liver Function Tests:  Recent Labs Lab 07/28/16 0319  AST 15  ALT 22  ALKPHOS 108  BILITOT 0.4  PROT 7.3  ALBUMIN 3.4*   No results for input(s): LIPASE, AMYLASE in the last 168 hours. No results for input(s): AMMONIA in the last 168 hours. Coagulation Profile: No results for input(s): INR, PROTIME in the last 168 hours. Cardiac Enzymes:  Recent Labs Lab 07/28/16 0020 07/28/16 0637 07/28/16 1325  TROPONINI 0.04* 0.04* 0.04*   BNP (last 3 results)  Recent Labs  08/02/15 1714  PROBNP 363.0*   HbA1C: No results for input(s): HGBA1C in the last 72 hours. CBG:  Recent Labs Lab 07/30/16 1703 07/30/16 2230 07/31/16 0724 07/31/16 1146 07/31/16 1649  GLUCAP 248* 293* 161* 138* 290*   Lipid Profile: No results for input(s): CHOL, HDL, LDLCALC, TRIG, CHOLHDL, LDLDIRECT in the last 72 hours. Thyroid Function Tests: No results for input(s): TSH, T4TOTAL, FREET4, T3FREE, THYROIDAB in the last 72 hours. Anemia Panel: No results for input(s): VITAMINB12, FOLATE, FERRITIN, TIBC, IRON, RETICCTPCT in the last 72 hours. Urine analysis:    Component Value Date/Time   COLORURINE YELLOW 10/26/2014 2206   APPEARANCEUR CLEAR 10/26/2014 2206   LABSPEC 1.017 10/26/2014 2206   PHURINE 6.0 10/26/2014 2206   GLUCOSEU NEGATIVE 10/26/2014 2206   HGBUR NEGATIVE 10/26/2014 2206   BILIRUBINUR NEGATIVE 10/26/2014 2206   KETONESUR NEGATIVE 10/26/2014 2206   PROTEINUR NEGATIVE 10/26/2014 2206   UROBILINOGEN 0.2 10/26/2014 2206   NITRITE NEGATIVE 10/26/2014 2206   LEUKOCYTESUR NEGATIVE 10/26/2014 2206   Sepsis Labs: _0 (procalcitonin:4,lacticidven:4) ) Recent Results (from the past 240 hour(s))  MRSA PCR Screening     Status: None    Collection Time: 07/28/16  3:01 AM  Result Value Ref Range Status   MRSA by PCR NEGATIVE NEGATIVE Final    Comment:        The GeneXpert MRSA Assay (FDA approved for NASAL specimens only), is one component of a comprehensive MRSA colonization surveillance program. It is not intended to diagnose MRSA infection nor to guide or monitor treatment for MRSA infections.          Radiology Studies: No results found.  Scheduled Meds: . carvedilol  25 mg Oral BID WC  . digoxin  0.125 mg Oral Daily  . doxycycline  100 mg Oral Q12H  . furosemide  80 mg Intravenous TID AC  . gabapentin  1,800 mg Oral QHS  . gabapentin  600 mg Oral Q lunch  . glipiZIDE  10 mg Oral BID AC  . guaiFENesin  600 mg Oral BID  . hydrALAZINE  25 mg Oral TID  . insulin aspart  0-15 Units Subcutaneous TID WC  . insulin aspart  0-5 Units Subcutaneous QHS  . insulin aspart  2 Units Subcutaneous TID WC  . insulin detemir  24 Units Subcutaneous QPM  . ipratropium  0.5 mg Nebulization TID  . isosorbide mononitrate  30 mg Oral Daily  . levalbuterol  0.63 mg Nebulization TID  . methadone  10 mg Oral Q12H  . mometasone-formoterol  2 puff Inhalation BID  . potassium chloride SA  40 mEq Oral Daily  . predniSONE  30 mg Oral Q breakfast  . rivaroxaban  20 mg Oral Q supper  . rosuvastatin  20 mg Oral Daily  . sacubitril-valsartan  1 tablet Oral BID  . sodium chloride flush  3 mL Intravenous Q12H  . sodium chloride flush  3 mL Intravenous Q12H  . spironolactone  25 mg Oral Daily   Continuous Infusions:    LOS: 3 days    Time spent in minutes: 25    Elizibeth Breau, MD [facility] Pager: [phone] [email or web address] Password [email or web address] 07/31/2016, 6:10 PM

## 2016-07-31 NOTE — Progress Notes (Addendum)
Spoke with pt concerning HH. Pt declined HH. Pt thinks that we are pushing [employer] on him to make money. Explained that this would benefit him with [facility] teaching and [facility]. Pt was very adamant in refusing HH.

## 2016-07-31 NOTE — Consult Note (Signed)
   [facility] CM Inpatient Consult   07/31/2016  Nicholas Caldwell [birth year] [id number]    [facility] Care Management follow up. Chart reviewed. Spoke with inpatient RNCM to discuss patient benefiting from [facility] RN as well for CHF management. Will continue to follow. [facility] Care Management to follow up post hospital discharge as well. Of note, [facility] Care Management will not interfere or replace services provided by home health.   Marthenia Rolling, MSN-Ed, RN,BSN [facility] Liaison [phone]

## 2016-07-31 NOTE — Progress Notes (Signed)
Patient Name: Nicholas Caldwell Date of Encounter: 07/31/2016  Primary Cardiologist: Nicholas Caprice, MD / J. Allred, MD   Hospital Problem List     Principal Problem:   Acute on chronic systolic CHF Active Problems:   Essential hypertension   Type 2 diabetes mellitus with neuropathy   Hyperlipidemia    COPD exacerbation (HCC)   Morbid obesity (HCC)   Chronic pain   PAF (paroxysmal atrial fibrillation) (HCC)   Nonischemic cardiomyopathy ([place])   CAD - Non-obstructive by LHC1/16   Uncontrolled type 2 diabetes mellitus with complication (HCC)   Paroxysmal atrial flutter (HCC)   Subjective   First time I have seen him. He asked if he could go home tomorrow. Still appears volume overloaded.  Inpatient Medications    . carvedilol  25 mg Oral BID WC  . digoxin  0.125 mg Oral Daily  . doxycycline  100 mg Oral Q12H  . furosemide  80 mg Intravenous TID AC  . gabapentin  1,800 mg Oral QHS  . gabapentin  600 mg Oral Q lunch  . glipiZIDE  10 mg Oral BID AC  . guaiFENesin  600 mg Oral BID  . hydrALAZINE  25 mg Oral TID  . insulin aspart  0-15 Units Subcutaneous TID WC  . insulin aspart  0-5 Units Subcutaneous QHS  . insulin aspart  2 Units Subcutaneous TID WC  . insulin detemir  24 Units Subcutaneous QPM  . ipratropium  0.5 mg Nebulization TID  . isosorbide mononitrate  30 mg Oral Daily  . levalbuterol  0.63 mg Nebulization TID  . methadone  10 mg Oral Q12H  . mometasone-formoterol  2 puff Inhalation BID  . potassium chloride SA  40 mEq Oral Daily  . predniSONE  30 mg Oral Q breakfast  . rivaroxaban  20 mg Oral Q supper  . rosuvastatin  20 mg Oral Daily  . sacubitril-valsartan  1 tablet Oral BID  . sodium chloride flush  3 mL Intravenous Q12H  . sodium chloride flush  3 mL Intravenous Q12H  . spironolactone  25 mg Oral Daily    Vital Signs    Vitals:   07/30/16 1437 07/30/16 2228 07/30/16 2235 07/31/16 0657  BP:   105/72 132/74  Pulse:   98 88  Resp:   20 20  Temp:   98.2  F (36.8 C) 98.2 F (36.8 C)  TempSrc:   Oral Oral  SpO2: 98% 95% 95% 96%  Weight:    294 lb 5 oz (133.5 kg)  Height:        Intake/Output Summary (Last 24 hours) at 07/31/16 0835 Last data filed at 07/31/16 0823  Gross per 24 hour  Intake              960 ml  Output             3325 ml  Net            -2365 ml   Filed Weights   07/29/16 0600 07/30/16 0500 07/31/16 0657  Weight: 292 lb 12.3 oz (132.8 kg) 298 lb 8.1 oz (135.4 kg) 294 lb 5 oz (133.5 kg)    Physical Exam   GEN: Morbidly obese AA male, on nalsal O2, in no acute distress.  HEENT: Grossly normal.  Neck: Supple, obese, difficult to gauge jvp.  No carotid bruits, or masses. Cardiac: IR, IR, distant, no murmurs, rubs, or gallops. No clubbing, cyanosis, 2-3+ bilat LE edema to calves.  Radials/DP/PT 2+ and equal  bilaterally.  Respiratory:  Respirations regular and unlabored, coarse expiratory rhonchi GI: obese, protuberant, nontender, BS + x 4. MS: no deformity or atrophy. Skin: warm and dry, no rash. Neuro:  Strength and sensation are intact. Psych: AAOx3.  Normal affect.  Labs    Basic Metabolic Panel  Recent Labs  07/29/16 0752 07/30/16 0507  NA 138 141  K 4.4 3.8  CL 95* 96*  CO2 35* 38*  GLUCOSE 212* 166*  BUN 25* 20  CREATININE 1.08 0.85  CALCIUM 8.6* 8.3*    Recent Labs  07/28/16 1325  TROPONINI 0.04*   Echo 07/01/16 Study Conclusions  - Left ventricle: The cavity size was moderately dilated. Wall   thickness was increased in a pattern of moderate LVH. Systolic   function was severely reduced. The estimated ejection fraction   was in the range of 25% to 30%. - Mitral valve: There was mild to moderate regurgitation. - Left atrium: The atrium was severely dilated. - Right ventricle: The cavity size was mildly dilated. Systolic   function was mildly to moderately reduced. - Right atrium: The atrium was severely dilated. - Tricuspid valve: There was mild-moderate regurgitation. -  Pulmonary arteries: Systolic pressure was moderately increased.   PA peak pressure: 54 mm Hg (S).  Telemetry    currently rate-controlled aflutter.  Radiology    CXR 07/27/16-  IMPRESSION: Vascular congestion and cardiomegaly. Increased interstitial markings raise concern for pulmonary edema.   Patient Profile     70 y.o. obese, AA male with history of nonischemic cardiomyopathy dx 1/02, chronic systolic HF w/ EF of 72-53%, paroxysmal atrial fib/flutter on Xarelto, Prolonged QT, HTN, HL, diabetes c/b neuropathy, chronic pain on methadone followed at pain clinic, questionable history of seizures, tobacco abuse, morbid obesity, COPD with chronic respiratory failure, admitted for COPD exacerbation, a/c systolic HF and atrial fibrillation/ flutter w/ RVR.    Assessment & Plan    1.  Acute on chronic systolic CHF/NICM:  EF 66-44% by echo in 06/2016.  Massively volume overloaded on admission.  Diuresing well.   Neg neg 8.0L since admission, though wt not significantly changed (adm wgt presumable in error).  Cont IV diuresis. Lasix changed to 80 TID 9/27 for more rapid diuresis.  Cont  blocker, Entresto, spiro, hydral, nitrate, and digoxin.  Prev declined ICD.  2.  PAF/Flutter:  Asymptomatic. In A flutter this AM.  Cont  blocker, digoxin, xarelto.  3.  Hypertensive heart dzs:  Stable on  blocker, ARNI, spriro, hydral, nitrate.  4.  HL:  Cont crestor.  Last LDL in epic was 72 in 10/2014.  5.  DM II:  Poorly controlled w/ A1c of 10.  Mgmt per IM.  6.  Chronic Pain:  On methadone.  7. Elevated trop:  In setting of CHF.  Mild elevation and flat trend likely represents demand ischemia.  Cath in 11/2014 showed nonobs dzs.  No plan for repeat ischemic eval @ this time.  Nicholas Form PA 07/31/2016, 8:35 AM   Attending Note:   The patient was seen and examined.  Agree with assessment and plan as noted above.  Changes made to the above note as needed.  Patient seen and independently  examined with Nicholas Ransom, PA .   We discussed all aspects of the encounter. I agree with the assessment and plan as stated above.  Nicholas Caldwell continues to diurese. Still has 2-3+ edema in legs.   I have spent a total of 20 minutes with patient reviewing hospital  notes , telemetry, EKGs, labs and examining patient as well as establishing an assessment and plan that was discussed with the patient. > 50% of time was spent in direct patient care.   Thayer Headings, Brooke Bonito., MD, [facility] 07/31/2016, 12:18 PM [address],  [place] Pager [phone]

## 2016-08-01 LAB — BASIC METABOLIC PANEL
ANION GAP: 8 (ref 5–15)
BUN: 22 mg/dL — AB (ref 6–20)
CHLORIDE: 91 mmol/L — AB (ref 101–111)
CO2: 37 mmol/L — ABNORMAL HIGH (ref 22–32)
Calcium: 8.6 mg/dL — ABNORMAL LOW (ref 8.9–10.3)
Creatinine, Ser: 0.94 mg/dL (ref 0.61–1.24)
GFR calc Af Amer: >60 mL/min (ref >60)
GFR calc non Af Amer: >60 mL/min (ref >60)
GLUCOSE: 264 mg/dL — AB (ref 65–99)
POTASSIUM: 4.1 mmol/L (ref 3.5–5.1)
Sodium: 136 mmol/L (ref 135–145)

## 2016-08-01 LAB — GLUCOSE, CAPILLARY
GLUCOSE-CAPILLARY: 153 mg/dL — AB (ref 65–99)
GLUCOSE-CAPILLARY: 184 mg/dL — AB (ref 65–99)
Glucose-Capillary: 206 mg/dL — ABNORMAL HIGH (ref 65–99)
Glucose-Capillary: 303 mg/dL — ABNORMAL HIGH (ref 65–99)

## 2016-08-01 MED ORDER — PREDNISONE 20 MG PO TABS
20.0000 mg | ORAL_TABLET | Freq: Every day | ORAL | Status: DC
  Administered 2016-08-02 – 2016-08-03 (×2): 20 mg via ORAL
  Filled 2016-08-01 (×2): qty 1

## 2016-08-01 NOTE — Plan of Care (Signed)
Problem: Pain Managment: Goal: General experience of comfort will improve Outcome: Progressing Pt c/o hip pain 9/10.  Norco prn given Q 4 hours along with scheduled methadone. Some relief per pt whom now rates pain 7/10.   Will continue with plan of care and continue to monitor pain.

## 2016-08-01 NOTE — Progress Notes (Signed)
PROGRESS NOTE    Nicholas Caldwell  LEX:[id number] DOB: Nov 30, 1945 DOA: 07/27/2016  PCP: [facility]   Brief Narrative:  Nicholas Caldwell is a 70 y.o. male with medical history significant of non-ischemic cardiomyopathy, Chronic Systolic CHF EF 44-96%, A-fibon Xarelto,Prolonged Q-T interval , HTN, HLD, COPD on 2 L O2 at home PRN,, Tobacco Abuse, DM Type 2 uncontrolled, chronic pain on Methadonewho presents with shortness of breath which started while he was watching TV at 7pm. He was noted to be wheezing in the ER which improved after a breathing treatment. He felt he had increasing pedal edema. He was admitted for dyspnea.      Subjective: Reports not in a good mood.  Wants to sleep.   Assessment & Plan:   Principal Problem:   PAF (paroxysmal atrial fibrillation)/ SVT/ mildly elevated troponin Rate controlled aflutter. cardiology consulted and recommendations given.  - currently on XArelto, Dig,  Bb.  - TSH normal   Dyspnea- ? Acute on chronic systolic CHF, COPD with chronic resp failure - on 2 L O2 at home - on diuresis with IV lasix TID/  -I/O last 3 completed shifts: In: 32 [P.O.:1560] Out: 56 [Urine:8900] Total I/O In: 840 [P.O.:840] Out: 3500 [Urine:3500]  DOXY for bronchitis.  Resume IV lasix for further diuresis. Repeat BMP pending today.      Essential hypertension  - controlled on current medications    Type 2 diabetes mellitus with diabetic neuropathy, with long-term current use of insulin -  Metformin on hold - cont Glucotrol as sugars, SSI and Levemir - CBG (last 3)   Recent Labs  08/01/16 0738 08/01/16 1159 08/01/16 1637  GLUCAP 153* 206* 303*   Added premeal coverage 2 units tidac.      Hyperlipidemia associated with type 2 diabetes mellitus   - Crestor    Morbid obesity  Body mass index is 38.52 kg/m.    Chronic pain - Methadone    CAD (coronary artery disease) - Non-obstructive by LHC1/16     DVT prophylaxis:  Xarelto Code Status: Full code Family Communication: family at bedside sleeping.  Disposition Plan: home when stable in 1-2 days Consultants:   cardiology Procedures:    Antimicrobials:  Anti-infectives    Start     Dose/Rate Route Frequency Ordered Stop   07/28/16 0230  doxycycline (VIBRA-TABS) tablet 100 mg     100 mg Oral Every 12 hours 07/28/16 0215         Objective: Vitals:   08/01/16 0800 08/01/16 0837 08/01/16 1300 08/01/16 1501  BP:  123/75  122/60  Pulse:    86  Resp:    16  Temp:    98.7 F (37.1 C)  TempSrc:    Oral  SpO2:   100% 99%  Weight: 128.8 kg (284 lb)     Height:        Intake/Output Summary (Last 24 hours) at 08/01/16 1725 Last data filed at 08/01/16 1505  Gross per 24 hour  Intake             1440 ml  Output             6400 ml  Net            -4960 ml   Filed Weights   07/31/16 0657 08/01/16 0730 08/01/16 0800  Weight: 133.5 kg (294 lb 5 oz) 132.7 kg (292 lb 9.6 oz) 128.8 kg (284 lb)    Examination: General exam: Appears comfortable  HEENT: PERRLA, oral  mucosa moist, no sclera icterus or thrush Respiratory system: Clear to auscultation. Respiratory effort normal.- 97% on 2 L Cardiovascular system: S1 & S2 heard, RRR- No murmurs  Gastrointestinal system: Abdomen soft, non-tender, nondistended. Normal bowel sound. No organomegaly Central nervous system: Alert and oriented. No focal neurological deficits. Extremities: No cyanosis, clubbing + 2 pitting edema of legs Skin: No rashes or ulcers Psychiatry:  Mood & affect appropriate.     Data Reviewed: I have personally reviewed following labs and imaging studies  CBC:  Recent Labs Lab 07/27/16 2220 07/28/16 0319  WBC 9.9 9.5  NEUTROABS 7.0  --   HGB 11.7* 11.9*  HCT 38.1* 39.3  MCV 87.2 87.3  PLT 358 485   Basic Metabolic Panel:  Recent Labs Lab 07/27/16 2220 07/28/16 0319 07/29/16 0752 07/30/16 0507  NA 141 142 138 141  K 4.0 3.8 4.4 3.8  CL 98* 96* 95* 96*  CO2  35* 37* 35* 38*  GLUCOSE 278* 230* 212* 166*  BUN 12 12 25* 20  CREATININE 0.82 0.82 1.08 0.85  CALCIUM 8.5* 8.8* 8.6* 8.3*  MG  --  1.8  --   --   PHOS  --  4.3  --   --    GFR: Estimated Creatinine Clearance: 115.4 mL/min (by C-G formula based on SCr of 0.85 mg/dL). Liver Function Tests:  Recent Labs Lab 07/28/16 0319  AST 15  ALT 22  ALKPHOS 108  BILITOT 0.4  PROT 7.3  ALBUMIN 3.4*   No results for input(s): LIPASE, AMYLASE in the last 168 hours. No results for input(s): AMMONIA in the last 168 hours. Coagulation Profile: No results for input(s): INR, PROTIME in the last 168 hours. Cardiac Enzymes:  Recent Labs Lab 07/28/16 0020 07/28/16 0637 07/28/16 1325  TROPONINI 0.04* 0.04* 0.04*   BNP (last 3 results) No results for input(s): PROBNP in the last 8760 hours. HbA1C: No results for input(s): HGBA1C in the last 72 hours. CBG:  Recent Labs Lab 07/31/16 1649 07/31/16 2131 08/01/16 0738 08/01/16 1159 08/01/16 1637  GLUCAP 290* 188* 153* 206* 303*   Lipid Profile: No results for input(s): CHOL, HDL, LDLCALC, TRIG, CHOLHDL, LDLDIRECT in the last 72 hours. Thyroid Function Tests: No results for input(s): TSH, T4TOTAL, FREET4, T3FREE, THYROIDAB in the last 72 hours. Anemia Panel: No results for input(s): VITAMINB12, FOLATE, FERRITIN, TIBC, IRON, RETICCTPCT in the last 72 hours. Urine analysis:    Component Value Date/Time   COLORURINE YELLOW 10/26/2014 2206   APPEARANCEUR CLEAR 10/26/2014 2206   LABSPEC 1.017 10/26/2014 2206   PHURINE 6.0 10/26/2014 2206   GLUCOSEU NEGATIVE 10/26/2014 2206   HGBUR NEGATIVE 10/26/2014 2206   BILIRUBINUR NEGATIVE 10/26/2014 2206   KETONESUR NEGATIVE 10/26/2014 2206   PROTEINUR NEGATIVE 10/26/2014 2206   UROBILINOGEN 0.2 10/26/2014 2206   NITRITE NEGATIVE 10/26/2014 2206   LEUKOCYTESUR NEGATIVE 10/26/2014 2206   Sepsis Labs: _0 (procalcitonin:4,lacticidven:4) ) Recent Results (from the past 240 hour(s))   MRSA PCR Screening     Status: None   Collection Time: 07/28/16  3:01 AM  Result Value Ref Range Status   MRSA by PCR NEGATIVE NEGATIVE Final    Comment:        The GeneXpert MRSA Assay (FDA approved for NASAL specimens only), is one component of a comprehensive MRSA colonization surveillance program. It is not intended to diagnose MRSA infection nor to guide or monitor treatment for MRSA infections.          Radiology Studies: No results found.  Scheduled Meds: . carvedilol  25 mg Oral BID WC  . digoxin  0.125 mg Oral Daily  . doxycycline  100 mg Oral Q12H  . furosemide  80 mg Intravenous TID AC  . gabapentin  1,800 mg Oral QHS  . gabapentin  600 mg Oral Q lunch  . glipiZIDE  10 mg Oral BID AC  . guaiFENesin  600 mg Oral BID  . hydrALAZINE  25 mg Oral TID  . insulin aspart  0-15 Units Subcutaneous TID WC  . insulin aspart  0-5 Units Subcutaneous QHS  . insulin aspart  2 Units Subcutaneous TID WC  . insulin detemir  24 Units Subcutaneous QPM  . ipratropium  0.5 mg Nebulization TID  . isosorbide mononitrate  30 mg Oral Daily  . levalbuterol  0.63 mg Nebulization TID  . methadone  10 mg Oral Q12H  . mometasone-formoterol  2 puff Inhalation BID  . potassium chloride SA  40 mEq Oral Daily  . predniSONE  30 mg Oral Q breakfast  . rivaroxaban  20 mg Oral Q supper  . rosuvastatin  20 mg Oral Daily  . sacubitril-valsartan  1 tablet Oral BID  . sodium chloride flush  3 mL Intravenous Q12H  . sodium chloride flush  3 mL Intravenous Q12H  . spironolactone  25 mg Oral Daily   Continuous Infusions:    LOS: 4 days    Time spent in minutes: 50    Dawnisha Marquina, MD [facility] Pager: [phone] [email or web address] Password [email or web address] 08/01/2016, 5:25 PM

## 2016-08-01 NOTE — Progress Notes (Signed)
Patient Name: Nicholas Caldwell Date of Encounter: 08/01/2016  Primary Cardiologist: Lendell Caprice, MD / J. Allred, MD   Hospital Problem List     Principal Problem:   Acute on chronic systolic CHF Active Problems:   Essential hypertension   Type 2 diabetes mellitus with neuropathy   Hyperlipidemia    COPD exacerbation (HCC)   Morbid obesity (HCC)   Chronic pain   PAF (paroxysmal atrial fibrillation) (HCC)   Nonischemic cardiomyopathy ([place])   CAD - Non-obstructive by LHC1/16   Uncontrolled type 2 diabetes mellitus with complication (HCC)   Paroxysmal atrial flutter (HCC)   Subjective  He still appeears volume overloaded. He says his wgt is usually 260-265 lbs.   Inpatient Medications    . carvedilol  25 mg Oral BID WC  . digoxin  0.125 mg Oral Daily  . doxycycline  100 mg Oral Q12H  . furosemide  80 mg Intravenous TID AC  . gabapentin  1,800 mg Oral QHS  . gabapentin  600 mg Oral Q lunch  . glipiZIDE  10 mg Oral BID AC  . guaiFENesin  600 mg Oral BID  . hydrALAZINE  25 mg Oral TID  . insulin aspart  0-15 Units Subcutaneous TID WC  . insulin aspart  0-5 Units Subcutaneous QHS  . insulin aspart  2 Units Subcutaneous TID WC  . insulin detemir  24 Units Subcutaneous QPM  . ipratropium  0.5 mg Nebulization TID  . isosorbide mononitrate  30 mg Oral Daily  . levalbuterol  0.63 mg Nebulization TID  . methadone  10 mg Oral Q12H  . mometasone-formoterol  2 puff Inhalation BID  . potassium chloride SA  40 mEq Oral Daily  . predniSONE  30 mg Oral Q breakfast  . rivaroxaban  20 mg Oral Q supper  . rosuvastatin  20 mg Oral Daily  . sacubitril-valsartan  1 tablet Oral BID  . sodium chloride flush  3 mL Intravenous Q12H  . sodium chloride flush  3 mL Intravenous Q12H  . spironolactone  25 mg Oral Daily    Vital Signs    Vitals:   07/31/16 2053 07/31/16 2136 08/01/16 0655 08/01/16 0730  BP:  117/64 121/72   Pulse:  99 89   Resp:  18 18   Temp:  97.7 F (36.5 C) 97.6 F (36.4  C)   TempSrc:  Oral Oral   SpO2: 96% 99% 100%   Weight:    292 lb 9.6 oz (132.7 kg)  Height:        Intake/Output Summary (Last 24 hours) at 08/01/16 0805 Last data filed at 08/01/16 0700  Gross per 24 hour  Intake             1320 ml  Output             7400 ml  Net            -6080 ml   Filed Weights   07/30/16 0500 07/31/16 0657 08/01/16 0730  Weight: 298 lb 8.1 oz (135.4 kg) 294 lb 5 oz (133.5 kg) 292 lb 9.6 oz (132.7 kg)    Physical Exam   GEN: Morbidly obese AA male, on nalsal O2, in no acute distress.  HEENT: Grossly normal.  Neck: Supple, obese, difficult to gauge jvp.  No carotid bruits, or masses. Cardiac: IR, IR, distant, no murmurs, rubs, or gallops. No clubbing, cyanosis, 2-3+ bilat LE edema to calves.  Radials/DP/PT 2+ and equal bilaterally.  Respiratory:  Respirations regular  and unlabored GI: obese, protuberant, nontender, BS + x 4. MS: no deformity or atrophy. Skin: warm and dry, no rash. Neuro:  Strength and sensation are intact. Psych: AAOx3.  Normal affect.  Labs    Basic Metabolic Panel  Recent Labs  07/30/16 0507  NA 141  K 3.8  CL 96*  CO2 38*  GLUCOSE 166*  BUN 20  CREATININE 0.85  CALCIUM 8.3*   No results for input(s): CKTOTAL, CKMB, CKMBINDEX, TROPONINI in the last 72 hours. Echo 07/01/16 Study Conclusions  - Left ventricle: The cavity size was moderately dilated. Wall   thickness was increased in a pattern of moderate LVH. Systolic   function was severely reduced. The estimated ejection fraction   was in the range of 25% to 30%. - Mitral valve: There was mild to moderate regurgitation. - Left atrium: The atrium was severely dilated. - Right ventricle: The cavity size was mildly dilated. Systolic   function was mildly to moderately reduced. - Right atrium: The atrium was severely dilated. - Tricuspid valve: There was mild-moderate regurgitation. - Pulmonary arteries: Systolic pressure was moderately increased.   PA peak  pressure: 54 mm Hg (S).  Telemetry    currently rate-controlled aflutter.  Radiology    CXR 07/27/16-  IMPRESSION: Vascular congestion and cardiomegaly. Increased interstitial markings raise concern for pulmonary edema.   Patient Profile     70 y.o. obese, AA male with history of nonischemic cardiomyopathy dx 5/36, chronic systolic HF w/ EF of 14-43%, paroxysmal atrial fib/flutter on Xarelto, Prolonged QT, HTN, HL, diabetes c/b neuropathy, chronic pain on methadone followed at pain clinic, questionable history of seizures, tobacco abuse, morbid obesity, COPD with chronic respiratory failure, admitted for COPD exacerbation, a/c systolic HF and atrial fibrillation/ flutter w/ RVR.    Assessment & Plan    1.  Acute on chronic systolic CHF/NICM:  EF 15-40% by echo in 06/2016.  Massively volume overloaded on admission.  Diuresing well.   Neg neg 15.1 L since admission, though wt not significantly changed (adm wgt presumable in error).  Cont IV diuresis. Lasix changed to 80 TID 9/27 for more rapid diuresis.  Cont  blocker, Entresto, spiro, hydral, nitrate, and digoxin.  Prev declined ICD.  2.  PAF/Flutter:  Asymptomatic. In A flutter this AM.  Cont  blocker, digoxin, xarelto.  3.  Hypertensive heart dzs:  Stable on  blocker, ARNI, spriro, hydral, nitrate.  4.  HL:  Cont crestor.  Last LDL in epic was 72 in 10/2014.  5.  DM II:  Poorly controlled w/ A1c of 10.  Mgmt per IM.  6.  Chronic Pain:  On methadone.  7. Elevated trop:  In setting of CHF.  Mild elevation and flat trend likely represents demand ischemia.  Cath in 11/2014 showed nonobs dzs.  No plan for repeat ischemic eval @ this time.  Plan:  His wgts are inconsistent, the staff has been using bed scale and I have asked them to get him out of bed for an accurate wgt. His BNP was not significantly elevated and his obesity makes it difficult to evaluate volume by physical exam. He is diuresing by I/O on current diuretic dose.  Continue IV diuresis till wgt gets closer to 265, or Bun indicates he is getting dry. MD to see.    Angelena Form PA 08/01/2016, 8:05 AM    Attending Note:   The patient was seen and examined.  Agree with assessment and plan as noted above.  Changes made  to the above note as needed.  Patient seen and independently examined with [name], PA .   We discussed all aspects of the encounter. I agree with the assessment and plan as stated above.  Pt continues to diurese.  I/O - 15 liters so far.  Will get a bmp daily    I have spent a total of 30 minutes with patient reviewing hospital  notes , telemetry, EKGs, labs and examining patient as well as establishing an assessment and plan that was discussed with the patient. > 50% of time was spent in direct patient care.    Thayer Headings, Brooke Bonito., MD, [facility] 08/01/2016, 11:15 AM [address],  [place] Pager [phone]

## 2016-08-01 NOTE — Progress Notes (Signed)
Pt today with c/o of not always getting meds on time.  Pain med was available at 1700.  At 1700 I had a new admit that needed immediate attention.  I peeked in the room and said to the pt, "I know it's time for your medicine, I am in the middle of something right now but I will be here in a few minutes.  Pt also complained of IV site which had old drainage at the site.  The IV still worked fine.  I told him I was going to change the dressing and clean the site.  He refused.  He said, "I am probably going to get MRSA here."  He said he wanted a new IV.  I said okay.  He then stated that I need to call IV team because he is a hard stick.  He wouldn't even let me try.  I put in an IV consult.

## 2016-08-02 DIAGNOSIS — I483 Typical atrial flutter: Secondary | ICD-10-CM

## 2016-08-02 LAB — BASIC METABOLIC PANEL
Anion gap: 8 (ref 5–15)
BUN: 23 mg/dL — ABNORMAL HIGH (ref 6–20)
CO2: 38 mmol/L — ABNORMAL HIGH (ref 22–32)
Calcium: 8.5 mg/dL — ABNORMAL LOW (ref 8.9–10.3)
Chloride: 92 mmol/L — ABNORMAL LOW (ref 101–111)
Creatinine, Ser: 0.93 mg/dL (ref 0.61–1.24)
GFR calc Af Amer: >60 mL/min (ref >60)
GFR calc non Af Amer: >60 mL/min (ref >60)
Glucose, Bld: 205 mg/dL — ABNORMAL HIGH (ref 65–99)
Potassium: 3.8 mmol/L (ref 3.5–5.1)
Sodium: 138 mmol/L (ref 135–145)

## 2016-08-02 LAB — GLUCOSE, CAPILLARY
GLUCOSE-CAPILLARY: 186 mg/dL — AB (ref 65–99)
Glucose-Capillary: 253 mg/dL — ABNORMAL HIGH (ref 65–99)
Glucose-Capillary: 367 mg/dL — ABNORMAL HIGH (ref 65–99)
Glucose-Capillary: 213 mg/dL — ABNORMAL HIGH (ref 65–99)

## 2016-08-02 MED ORDER — INSULIN ASPART 100 UNIT/ML ~~LOC~~ SOLN
4.0000 [IU] | Freq: Three times a day (TID) | SUBCUTANEOUS | Status: DC
  Administered 2016-08-02 – 2016-08-04 (×6): 4 [IU] via SUBCUTANEOUS

## 2016-08-02 NOTE — Progress Notes (Signed)
Inpatient Diabetes Program Recommendations  AACE/ADA: New Consensus Statement on Inpatient Glycemic Control (2015)  Target Ranges:  Prepandial:   less than 140 mg/dL      Peak postprandial:   less than 180 mg/dL (1-2 hours)      Critically ill patients:  140 - 180 mg/dL   Lab Results  Component Value Date   GLUCAP 186 (H) 08/02/2016   HGBA1C 10.0 (H) 07/28/2016    Review of Glycemic Control  Inpatient Diabetes Program Recommendations:   Glucose increased into the 300's at supper time. Please consider increasing meal coverage, Novolog 4-5 units TID.  Thanks,  Tama Headings RN, MSN, [facility] Inpatient Diabetes Coordinator Team Pager [phone] (8a-5p)

## 2016-08-02 NOTE — Progress Notes (Addendum)
Patient Name: Lorin Gawron Date of Encounter: 08/02/2016  Primary Cardiologist: Lendell Caprice, MD / J. Allred, MD   Hospital Problem List     Principal Problem:   Acute on chronic systolic CHF Active Problems:   Essential hypertension   Type 2 diabetes mellitus with neuropathy   Hyperlipidemia    COPD exacerbation (HCC)   Morbid obesity (HCC)   Chronic pain   PAF (paroxysmal atrial fibrillation) (HCC)   Nonischemic cardiomyopathy ([place])   CAD - Non-obstructive by LHC1/16   Uncontrolled type 2 diabetes mellitus with complication (HCC)   Paroxysmal atrial flutter (HCC)   Subjective  He still appeears volume overloaded. He says his wgt is usually 260-265 lbs.   Inpatient Medications    . carvedilol  25 mg Oral BID WC  . digoxin  0.125 mg Oral Daily  . doxycycline  100 mg Oral Q12H  . furosemide  80 mg Intravenous TID AC  . gabapentin  1,800 mg Oral QHS  . gabapentin  600 mg Oral Q lunch  . glipiZIDE  10 mg Oral BID AC  . guaiFENesin  600 mg Oral BID  . hydrALAZINE  25 mg Oral TID  . insulin aspart  0-15 Units Subcutaneous TID WC  . insulin aspart  0-5 Units Subcutaneous QHS  . insulin aspart  2 Units Subcutaneous TID WC  . insulin detemir  24 Units Subcutaneous QPM  . ipratropium  0.5 mg Nebulization TID  . isosorbide mononitrate  30 mg Oral Daily  . levalbuterol  0.63 mg Nebulization TID  . methadone  10 mg Oral Q12H  . mometasone-formoterol  2 puff Inhalation BID  . potassium chloride SA  40 mEq Oral Daily  . predniSONE  20 mg Oral Q breakfast  . rivaroxaban  20 mg Oral Q supper  . rosuvastatin  20 mg Oral Daily  . sacubitril-valsartan  1 tablet Oral BID  . sodium chloride flush  3 mL Intravenous Q12H  . sodium chloride flush  3 mL Intravenous Q12H  . spironolactone  25 mg Oral Daily    Vital Signs    Vitals:   08/01/16 1501 08/01/16 2111 08/01/16 2200 08/02/16 0656  BP: 122/60  125/79 125/69  Pulse: 86  89 88  Resp: _0 Temp: 98.7 F (37.1 C)   97.8 F (36.6 C) 98.4 F (36.9 C)  TempSrc: Oral  Oral Oral  SpO2: 99% 98% 96% 98%  Weight:    281 lb 1.6 oz (127.5 kg)  Height:        Intake/Output Summary (Last 24 hours) at 08/02/16 0809 Last data filed at 08/02/16 0700  Gross per 24 hour  Intake             1560 ml  Output             7100 ml  Net            -5540 ml   Filed Weights   08/01/16 0730 08/01/16 0800 08/02/16 0656  Weight: 292 lb 9.6 oz (132.7 kg) 284 lb (128.8 kg) 281 lb 1.6 oz (127.5 kg)    Physical Exam   GEN: Morbidly obese AA male, on nalsal O2, in no acute distress.  HEENT: Grossly normal.  Neck: Supple, obese, difficult to gauge jvp.  No carotid bruits, or masses. Cardiac: IR, IR, distant, no murmurs, rubs, or gallops. No clubbing, cyanosis, 2-3+ bilat LE edema to calves.  Radials/DP/PT 2+ and equal bilaterally.  Respiratory:  Respirations  regular and unlabored GI: obese, protuberant, nontender, BS + x 4. MS: no deformity or atrophy. Skin: warm and dry, no rash. Neuro:  Strength and sensation are intact. Psych: AAOx3.  Normal affect.  Labs    Basic Metabolic Panel  Recent Labs  08/01/16 1727 08/02/16 0536  NA 136 138  K 4.1 3.8  CL 91* 92*  CO2 37* 38*  GLUCOSE 264* 205*  BUN 22* 23*  CREATININE 0.94 0.93  CALCIUM 8.6* 8.5*   No results for input(s): CKTOTAL, CKMB, CKMBINDEX, TROPONINI in the last 72 hours. Echo 07/01/16 Study Conclusions  - Left ventricle: The cavity size was moderately dilated. Wall   thickness was increased in a pattern of moderate LVH. Systolic   function was severely reduced. The estimated ejection fraction   was in the range of 25% to 30%. - Mitral valve: There was mild to moderate regurgitation. - Left atrium: The atrium was severely dilated. - Right ventricle: The cavity size was mildly dilated. Systolic   function was mildly to moderately reduced. - Right atrium: The atrium was severely dilated. - Tricuspid valve: There was mild-moderate  regurgitation. - Pulmonary arteries: Systolic pressure was moderately increased.   PA peak pressure: 54 mm Hg (S).  Telemetry    currently rate-controlled aflutter.  Radiology    CXR 07/27/16-  IMPRESSION: Vascular congestion and cardiomegaly. Increased interstitial markings raise concern for pulmonary edema.   Patient Profile     70 y.o. obese, AA male with history of nonischemic cardiomyopathy dx 1/91, chronic systolic HF w/ EF of 47-82%, paroxysmal atrial fib/flutter on Xarelto, Prolonged QT, HTN, HL, diabetes c/b neuropathy, chronic pain on methadone followed at pain clinic, questionable history of seizures, tobacco abuse, morbid obesity, COPD with chronic respiratory failure, admitted for COPD exacerbation, a/c systolic HF and atrial fibrillation/ flutter w/ RVR.    Assessment & Plan    1.  Acute on chronic systolic CHF/NICM:  EF 95-62% by echo in 06/2016.  Massively volume overloaded on admission.  Diuresing well.   Neg neg 19.8  L since admission, though wt not significantly changed (adm wgt presumable in error).  Cont IV diuresis. Lasix changed to 80 TID 9/27 for more rapid diuresis.  Cont  blocker, Entresto, spiro, hydral, nitrate, and digoxin.  Prev declined ICD.  His weights and I/O are not correlating ???? Ive asked him to make sure that all his intake of fluids are recorded.   His leg edema seems to be generally improving   2.  PAF/Flutter:  Asymptomatic. In A flutter this AM.  Cont  blocker, digoxin, xarelto.  3.  Hypertensive heart dzs:  Stable on  blocker, ARNI, spriro, hydral, nitrate.  4.  HL:  Cont crestor.  Last LDL in epic was 72 in 10/2014.  5.  DM II:  Poorly controlled w/ A1c of 10.  Mgmt per IM.  6.  Chronic Pain:  On methadone.  7. Elevated trop:  In setting of CHF.  Mild elevation and flat trend likely represents demand ischemia.  Cath in 11/2014 showed nonobs dzs.  No plan for repeat ischemic eval @ this time.  Plan:  His wgts are inconsistent, the  staff has been using bed scale and I have asked them to get him out of bed for an accurate wgt. His BNP was not significantly elevated and his obesity makes it difficult to evaluate volume by physical exam. He is diuresing by I/O on current diuretic dose. Continue IV diuresis till wgt gets closer to  265, or Bun indicates he is getting dry.     Mertie Moores, MD  08/03/2016 8:39 AM    [address],  [place], [place]  [address] Pager [phone] Phone: [phone]; Fax: [phone]) [phone]   .

## 2016-08-02 NOTE — Progress Notes (Signed)
PROGRESS NOTE    Nicholas Caldwell  VVL:[id number] DOB: August 11, 1946 DOA: 07/27/2016  PCP: [facility]   Brief Narrative:  Nicholas Caldwell is a 70 y.o. male with medical history significant of non-ischemic cardiomyopathy, Chronic Systolic CHF EF 80-04%, A-fibon Xarelto,Prolonged Q-T interval , HTN, HLD, COPD on 2 L O2 at home PRN,, Tobacco Abuse, DM Type 2 uncontrolled, chronic pain on Methadonewho presents with shortness of breath which started while he was watching TV at 7pm. He was noted to be wheezing in the ER which improved after a breathing treatment. He felt he had increasing pedal edema. He was admitted for dyspnea.      Subjective: No new complaints.   Assessment & Plan:   Principal Problem:   PAF (paroxysmal atrial fibrillation)/ SVT/ mildly elevated troponin Rate controlled aflutter. cardiology consulted and recommendations given.  - currently on XArelto, Dig,  Bb.  - TSH normal   Dyspnea- ? Acute on chronic systolic CHF, COPD with chronic resp failure - on 2 L O2 at home - on diuresis with IV lasix TID/  -I/O last 3 completed shifts: In: 2160 [P.O.:2160] Out: 8600 [Urine:8600] Total I/O In: -  Out: 2500 [Urine:2500]  Completed doxycycline.  Resume IV lasix for further diuresis. Repeat BMP shows normal potassium.       Essential hypertension  - controlled on current medications    Type 2 diabetes mellitus with diabetic neuropathy, with long-term current use of insulin -  Metformin on hold - cont Glucotrol as sugars, SSI and Levemir - CBG (last 3)   Recent Labs  08/01/16 2142 08/02/16 0726 08/02/16 1224  GLUCAP 184* 186* 253*   Increase to 4 units of premeal coverage.      Hyperlipidemia associated with type 2 diabetes mellitus   - Crestor    Morbid obesity  Body mass index is 38.12 kg/m.    Chronic pain - Methadone    CAD (coronary artery disease) - Non-obstructive by LHC1/16     DVT prophylaxis: Xarelto Code Status: Full  code Family Communication: none at bedside.  Disposition Plan: home when stable in 1-2 days Consultants:   cardiology Procedures:    Antimicrobials:  Anti-infectives    Start     Dose/Rate Route Frequency Ordered Stop   07/28/16 0230  doxycycline (VIBRA-TABS) tablet 100 mg     100 mg Oral Every 12 hours 07/28/16 0215         Objective: Vitals:   08/01/16 2200 08/02/16 0656 08/02/16 1406 08/02/16 1430  BP: 125/79 125/69  120/66  Pulse: 89 88  92  Resp: _0 Temp: 97.8 F (36.6 C) 98.4 F (36.9 C)  98.3 F (36.8 C)  TempSrc: Oral Oral  Oral  SpO2: 96% 98% 98% 94%  Weight:  127.5 kg (281 lb 1.6 oz)    Height:        Intake/Output Summary (Last 24 hours) at 08/02/16 1754 Last data filed at 08/02/16 1430  Gross per 24 hour  Intake              720 ml  Output             5300 ml  Net            -4580 ml   Filed Weights   08/01/16 0730 08/01/16 0800 08/02/16 0656  Weight: 132.7 kg (292 lb 9.6 oz) 128.8 kg (284 lb) 127.5 kg (281 lb 1.6 oz)    Examination: General exam: Appears comfortable  HEENT: PERRLA, oral mucosa moist, no sclera icterus or thrush Respiratory system: Clear to auscultation. Respiratory effort normal.- 97% on 2 L Cardiovascular system: S1 & S2 heard, RRR- No murmurs  Gastrointestinal system: Abdomen soft, non-tender, nondistended. Normal bowel sound. No organomegaly Central nervous system: Alert and oriented. No focal neurological deficits. Extremities: No cyanosis, clubbing + 2 pitting edema of legs Skin: No rashes or ulcers Psychiatry:  Mood & affect appropriate.     Data Reviewed: I have personally reviewed following labs and imaging studies  CBC:  Recent Labs Lab 07/27/16 2220 07/28/16 0319  WBC 9.9 9.5  NEUTROABS 7.0  --   HGB 11.7* 11.9*  HCT 38.1* 39.3  MCV 87.2 87.3  PLT 358 983   Basic Metabolic Panel:  Recent Labs Lab 07/28/16 0319 07/29/16 0752 07/30/16 0507 08/01/16 1727 08/02/16 0536  NA 142 138 141 136  138  K 3.8 4.4 3.8 4.1 3.8  CL 96* 95* 96* 91* 92*  CO2 37* 35* 38* 37* 38*  GLUCOSE 230* 212* 166* 264* 205*  BUN 12 25* 20 22* 23*  CREATININE 0.82 1.08 0.85 0.94 0.93  CALCIUM 8.8* 8.6* 8.3* 8.6* 8.5*  MG 1.8  --   --   --   --   PHOS 4.3  --   --   --   --    GFR: Estimated Creatinine Clearance: 104.9 mL/min (by C-G formula based on SCr of 0.93 mg/dL). Liver Function Tests:  Recent Labs Lab 07/28/16 0319  AST 15  ALT 22  ALKPHOS 108  BILITOT 0.4  PROT 7.3  ALBUMIN 3.4*   No results for input(s): LIPASE, AMYLASE in the last 168 hours. No results for input(s): AMMONIA in the last 168 hours. Coagulation Profile: No results for input(s): INR, PROTIME in the last 168 hours. Cardiac Enzymes:  Recent Labs Lab 07/28/16 0020 07/28/16 0637 07/28/16 1325  TROPONINI 0.04* 0.04* 0.04*   BNP (last 3 results) No results for input(s): PROBNP in the last 8760 hours. HbA1C: No results for input(s): HGBA1C in the last 72 hours. CBG:  Recent Labs Lab 08/01/16 1159 08/01/16 1637 08/01/16 2142 08/02/16 0726 08/02/16 1224  GLUCAP 206* 303* 184* 186* 253*   Lipid Profile: No results for input(s): CHOL, HDL, LDLCALC, TRIG, CHOLHDL, LDLDIRECT in the last 72 hours. Thyroid Function Tests: No results for input(s): TSH, T4TOTAL, FREET4, T3FREE, THYROIDAB in the last 72 hours. Anemia Panel: No results for input(s): VITAMINB12, FOLATE, FERRITIN, TIBC, IRON, RETICCTPCT in the last 72 hours. Urine analysis:    Component Value Date/Time   COLORURINE YELLOW 10/26/2014 2206   APPEARANCEUR CLEAR 10/26/2014 2206   LABSPEC 1.017 10/26/2014 2206   PHURINE 6.0 10/26/2014 2206   GLUCOSEU NEGATIVE 10/26/2014 2206   HGBUR NEGATIVE 10/26/2014 2206   BILIRUBINUR NEGATIVE 10/26/2014 2206   KETONESUR NEGATIVE 10/26/2014 2206   PROTEINUR NEGATIVE 10/26/2014 2206   UROBILINOGEN 0.2 10/26/2014 2206   NITRITE NEGATIVE 10/26/2014 2206   LEUKOCYTESUR NEGATIVE 10/26/2014 2206   Sepsis  Labs: _0 (procalcitonin:4,lacticidven:4) ) Recent Results (from the past 240 hour(s))  MRSA PCR Screening     Status: None   Collection Time: 07/28/16  3:01 AM  Result Value Ref Range Status   MRSA by PCR NEGATIVE NEGATIVE Final    Comment:        The GeneXpert MRSA Assay (FDA approved for NASAL specimens only), is one component of a comprehensive MRSA colonization surveillance program. It is not intended to diagnose MRSA infection nor to guide or monitor  treatment for MRSA infections.          Radiology Studies: No results found.    Scheduled Meds: . carvedilol  25 mg Oral BID WC  . digoxin  0.125 mg Oral Daily  . doxycycline  100 mg Oral Q12H  . furosemide  80 mg Intravenous TID AC  . gabapentin  1,800 mg Oral QHS  . gabapentin  600 mg Oral Q lunch  . glipiZIDE  10 mg Oral BID AC  . guaiFENesin  600 mg Oral BID  . hydrALAZINE  25 mg Oral TID  . insulin aspart  0-15 Units Subcutaneous TID WC  . insulin aspart  0-5 Units Subcutaneous QHS  . insulin aspart  4 Units Subcutaneous TID WC  . insulin detemir  24 Units Subcutaneous QPM  . ipratropium  0.5 mg Nebulization TID  . isosorbide mononitrate  30 mg Oral Daily  . levalbuterol  0.63 mg Nebulization TID  . methadone  10 mg Oral Q12H  . mometasone-formoterol  2 puff Inhalation BID  . potassium chloride SA  40 mEq Oral Daily  . predniSONE  20 mg Oral Q breakfast  . rivaroxaban  20 mg Oral Q supper  . rosuvastatin  20 mg Oral Daily  . sacubitril-valsartan  1 tablet Oral BID  . sodium chloride flush  3 mL Intravenous Q12H  . sodium chloride flush  3 mL Intravenous Q12H  . spironolactone  25 mg Oral Daily   Continuous Infusions:    LOS: 5 days    Time spent in minutes: 67    Nicholas Haberle, MD [facility] Pager: [phone] [email or web address] Password [facility] 08/02/2016, 5:54 PM

## 2016-08-03 DIAGNOSIS — I4892 Unspecified atrial flutter: Secondary | ICD-10-CM

## 2016-08-03 LAB — GLUCOSE, CAPILLARY
GLUCOSE-CAPILLARY: 260 mg/dL — AB (ref 65–99)
Glucose-Capillary: 264 mg/dL — ABNORMAL HIGH (ref 65–99)
Glucose-Capillary: 329 mg/dL — ABNORMAL HIGH (ref 65–99)

## 2016-08-03 LAB — BASIC METABOLIC PANEL
Anion gap: 9 (ref 5–15)
BUN: 22 mg/dL — ABNORMAL HIGH (ref 6–20)
CO2: 38 mmol/L — ABNORMAL HIGH (ref 22–32)
Calcium: 8.6 mg/dL — ABNORMAL LOW (ref 8.9–10.3)
Chloride: 89 mmol/L — ABNORMAL LOW (ref 101–111)
Creatinine, Ser: 0.94 mg/dL (ref 0.61–1.24)
GFR calc Af Amer: >60 mL/min (ref >60)
GFR calc non Af Amer: >60 mL/min (ref >60)
Glucose, Bld: 282 mg/dL — ABNORMAL HIGH (ref 65–99)
Potassium: 3.8 mmol/L (ref 3.5–5.1)
Sodium: 136 mmol/L (ref 135–145)

## 2016-08-03 MED ORDER — INSULIN DETEMIR 100 UNIT/ML ~~LOC~~ SOLN
30.0000 [IU] | Freq: Every evening | SUBCUTANEOUS | Status: DC
  Administered 2016-08-03: 30 [IU] via SUBCUTANEOUS
  Filled 2016-08-03 (×2): qty 0.3

## 2016-08-03 NOTE — Progress Notes (Signed)
Patient Name: Nicholas Caldwell Date of Encounter: 08/03/2016  Primary Cardiologist: Lendell Caprice, MD / J. Allred, MD   Hospital Problem List     Principal Problem:   Acute on chronic systolic CHF Active Problems:   Essential hypertension   Type 2 diabetes mellitus with neuropathy   Hyperlipidemia    COPD exacerbation (HCC)   Morbid obesity (HCC)   Chronic pain   PAF (paroxysmal atrial fibrillation) (HCC)   Nonischemic cardiomyopathy ([place])   CAD - Non-obstructive by LHC1/16   Uncontrolled type 2 diabetes mellitus with complication (HCC)   Paroxysmal atrial flutter (HCC)   Subjective  Breathing is better  The I/O and weights do not match   Inpatient Medications    . carvedilol  25 mg Oral BID WC  . digoxin  0.125 mg Oral Daily  . furosemide  80 mg Intravenous TID AC  . gabapentin  1,800 mg Oral QHS  . gabapentin  600 mg Oral Q lunch  . glipiZIDE  10 mg Oral BID AC  . guaiFENesin  600 mg Oral BID  . hydrALAZINE  25 mg Oral TID  . insulin aspart  0-15 Units Subcutaneous TID WC  . insulin aspart  0-5 Units Subcutaneous QHS  . insulin aspart  4 Units Subcutaneous TID WC  . insulin detemir  24 Units Subcutaneous QPM  . ipratropium  0.5 mg Nebulization TID  . isosorbide mononitrate  30 mg Oral Daily  . levalbuterol  0.63 mg Nebulization TID  . methadone  10 mg Oral Q12H  . mometasone-formoterol  2 puff Inhalation BID  . potassium chloride SA  40 mEq Oral Daily  . predniSONE  20 mg Oral Q breakfast  . rivaroxaban  20 mg Oral Q supper  . rosuvastatin  20 mg Oral Daily  . sacubitril-valsartan  1 tablet Oral BID  . sodium chloride flush  3 mL Intravenous Q12H  . sodium chloride flush  3 mL Intravenous Q12H  . spironolactone  25 mg Oral Daily    Vital Signs    Vitals:   08/02/16 1430 08/02/16 2004 08/02/16 2102 08/03/16 0648  BP: 120/66  128/66 121/60  Pulse: 92  88 85  Resp: _0 Temp: 98.3 F (36.8 C)  98.4 F (36.9 C) 97.2 F (36.2 C)  TempSrc: Oral  Oral  Axillary  SpO2: 94% 96% 94% 99%  Weight:    284 lb 9.8 oz (129.1 kg)  Height:        Intake/Output Summary (Last 24 hours) at 08/03/16 0830 Last data filed at 08/03/16 0800  Gross per 24 hour  Intake                0 ml  Output             5900 ml  Net            -5900 ml   Filed Weights   08/01/16 0800 08/02/16 0656 08/03/16 0648  Weight: 284 lb (128.8 kg) 281 lb 1.6 oz (127.5 kg) 284 lb 9.8 oz (129.1 kg)    Physical Exam   GEN: Morbidly obese AA male, on nalsal O2, in no acute distress.  HEENT: Grossly normal.  Neck: Supple, obese, difficult to gauge jvp.  No carotid bruits, or masses. Cardiac: IR, IR, distant, no murmurs, rubs, or gallops. No clubbing, cyanosis, 2-3+ bilat LE edema to calves.  Radials/DP/PT 2+ and equal bilaterally.  Respiratory:  Respirations regular and unlabored GI: obese, protuberant, nontender,  BS + x 4. MS: no deformity or atrophy. Skin: warm and dry, no rash. Neuro:  Strength and sensation are intact. Psych: AAOx3.  Normal affect.  Labs    Basic Metabolic Panel  Recent Labs  08/02/16 0536 08/03/16 0523  NA 138 136  K 3.8 3.8  CL 92* 89*  CO2 38* 38*  GLUCOSE 205* 282*  BUN 23* 22*  CREATININE 0.93 0.94  CALCIUM 8.5* 8.6*   No results for input(s): CKTOTAL, CKMB, CKMBINDEX, TROPONINI in the last 72 hours. Echo 07/01/16 Study Conclusions  - Left ventricle: The cavity size was moderately dilated. Wall   thickness was increased in a pattern of moderate LVH. Systolic   function was severely reduced. The estimated ejection fraction   was in the range of 25% to 30%. - Mitral valve: There was mild to moderate regurgitation. - Left atrium: The atrium was severely dilated. - Right ventricle: The cavity size was mildly dilated. Systolic   function was mildly to moderately reduced. - Right atrium: The atrium was severely dilated. - Tricuspid valve: There was mild-moderate regurgitation. - Pulmonary arteries: Systolic pressure was moderately  increased.   PA peak pressure: 54 mm Hg (S).  Telemetry    currently rate-controlled aflutter.  Radiology    CXR 07/27/16-  IMPRESSION: Vascular congestion and cardiomegaly. Increased interstitial markings raise concern for pulmonary edema.   Patient Profile     70 y.o. obese, AA male with history of nonischemic cardiomyopathy dx 7/91, chronic systolic HF w/ EF of 50-56%, paroxysmal atrial fib/flutter on Xarelto, Prolonged QT, HTN, HL, diabetes c/b neuropathy, chronic pain on methadone followed at pain clinic, questionable history of seizures, tobacco abuse, morbid obesity, COPD with chronic respiratory failure, admitted for COPD exacerbation, a/c systolic HF and atrial fibrillation/ flutter w/ RVR.    Assessment & Plan    1.  Acute on chronic systolic CHF/NICM:  EF 97-94% by echo in 06/2016.  Massively volume overloaded on admission.  Diuresing well.   Neg neg 25.1 L since admission, though wt not significantly changed (adm wgt presumable in error). Will change to lasix 40 PO bid. Probably home tomorrow He admits to eating more salt than he should at home prior to admission.   The lasix should work well We can consider changing to Torsemide if the lasix quits working .   2.  PAF/Flutter:  Asymptomatic. In A flutter this AM.  Cont  blocker, digoxin, xarelto.  3.  Hypertensive heart dzs:  Stable on  blocker, ARNI, spriro, hydral, nitrate.  4.  HL:  Cont crestor.  Last LDL in epic was 72 in 10/2014.  5.  DM II:  Poorly controlled w/ A1c of 10.  Mgmt per IM.  6.  Chronic Pain:  On methadone.  7. Elevated trop:  In setting of CHF.  Mild elevation and flat trend likely represents demand ischemia.  Cath in 11/2014 showed nonobs dzs.  No plan for repeat ischemic eval @ this time.      Mertie Moores, MD  08/03/2016 8:38 AM    [address] [place],  [place], [place]  [address] Pager [phone] Phone: [phone]; Fax: [phone]

## 2016-08-03 NOTE — Progress Notes (Signed)
PROGRESS NOTE    Nicholas Caldwell  WYO:[id number] DOB: [birth year] DOA: 07/27/2016  PCP: [facility]   Brief Narrative:  Nicholas Caldwell is a 70 y.o. male with medical history significant of non-ischemic cardiomyopathy, Chronic Systolic CHF EF 77-41%, A-fibon Xarelto,Prolonged Q-T interval , HTN, HLD, COPD on 2 L O2 at home PRN,, Tobacco Abuse, DM Type 2 uncontrolled, chronic pain on Methadonewho presents with shortness of breath which started while he was watching TV at 7pm. He was noted to be wheezing in the ER which improved after a breathing treatment. He felt he had increasing pedal edema. He was admitted for dyspnea.      Subjective: No new complaints. Looking forward to being discharged home in am.   Assessment & Plan:   Principal Problem:   PAF (paroxysmal atrial fibrillation)/ SVT/ mildly elevated troponin Rate controlled aflutter. cardiology consulted and recommendations given.  - currently on XArelto, Dig,  Bb.  - TSH normal   Dyspnea- ? Acute on chronic systolic CHF, COPD with chronic resp failure - on 2 L O2 at home - on diuresis with IV lasix TID/ , changed to po lasix today.  -I/O last 3 completed shifts: In: 44 [P.O.:720] Out: 6900 [Urine:6900] Total I/O In: 840 [P.O.:840] Out: 2500 [Urine:2500]  Completed doxycycline.  Repeat BMP shows normal potassium.       Essential hypertension  - controlled on current medications    Type 2 diabetes mellitus with diabetic neuropathy, with long-term current use of insulin -  Metformin on hold - On Glucotrol as sugars, SSI and Levemir , increased to levemir 30 units daily.  - CBG (last 3)   Recent Labs  08/02/16 2112 08/03/16 0725 08/03/16 1136  GLUCAP 213* 264* 329*   Increase to 4 units of premeal coverage.      Hyperlipidemia associated with type 2 diabetes mellitus   - Crestor    Morbid obesity  Body mass index is 38.6 kg/m.    Chronic pain - Methadone    CAD (coronary artery disease) -  Non-obstructive by LHC1/16     DVT prophylaxis: Xarelto Code Status: Full code Family Communication: none at bedside.  Disposition Plan: home when stable in 1-2 days Consultants:   cardiology Procedures:    Antimicrobials:  Anti-infectives    Start     Dose/Rate Route Frequency Ordered Stop   07/28/16 0230  doxycycline (VIBRA-TABS) tablet 100 mg  Status:  Discontinued     100 mg Oral Every 12 hours 07/28/16 0215 08/02/16 1757       Objective: Vitals:   08/03/16 0648 08/03/16 1007 08/03/16 1400 08/03/16 1445  BP: 121/60 115/61 127/60   Pulse: 85 88 92   Resp: 16  20   Temp: 97.2 F (36.2 C)  97.8 F (36.6 C)   TempSrc: Axillary  Oral   SpO2: 99%  98% 97%  Weight: 129.1 kg (284 lb 9.8 oz)     Height:        Intake/Output Summary (Last 24 hours) at 08/03/16 1450 Last data filed at 08/03/16 1300  Gross per 24 hour  Intake              840 ml  Output             4100 ml  Net            -3260 ml   Filed Weights   08/01/16 0800 08/02/16 0656 08/03/16 0648  Weight: 128.8 kg (284 lb) 127.5 kg (281 lb  1.6 oz) 129.1 kg (284 lb 9.8 oz)    Examination: General exam: Appears comfortable , just walked .  HEENT: PERRLA, oral mucosa moist, no sclera icterus or thrush Respiratory system: Clear to auscultation. Respiratory effort normal.- 97% on 2 L Cardiovascular system: S1 & S2 heard, RRR- No murmurs  Gastrointestinal system: Abdomen soft, non-tender, nondistended. Normal bowel sound. No organomegaly Central nervous system: Alert and oriented. No focal neurological deficits. Extremities: No cyanosis, clubbing + 2 pitting edema of legs Skin: No rashes or ulcers Psychiatry:  Mood & affect appropriate.     Data Reviewed: I have personally reviewed following labs and imaging studies  CBC:  Recent Labs Lab 07/27/16 2220 07/28/16 0319  WBC 9.9 9.5  NEUTROABS 7.0  --   HGB 11.7* 11.9*  HCT 38.1* 39.3  MCV 87.2 87.3  PLT 358 283   Basic Metabolic Panel:  Recent  Labs Lab 07/28/16 0319 07/29/16 0752 07/30/16 0507 08/01/16 1727 08/02/16 0536 08/03/16 0523  NA 142 138 141 136 138 136  K 3.8 4.4 3.8 4.1 3.8 3.8  CL 96* 95* 96* 91* 92* 89*  CO2 37* 35* 38* 37* 38* 38*  GLUCOSE 230* 212* 166* 264* 205* 282*  BUN 12 25* 20 22* 23* 22*  CREATININE 0.82 1.08 0.85 0.94 0.93 0.94  CALCIUM 8.8* 8.6* 8.3* 8.6* 8.5* 8.6*  MG 1.8  --   --   --   --   --   PHOS 4.3  --   --   --   --   --    GFR: Estimated Creatinine Clearance: 104.5 mL/min (by C-G formula based on SCr of 0.94 mg/dL). Liver Function Tests:  Recent Labs Lab 07/28/16 0319  AST 15  ALT 22  ALKPHOS 108  BILITOT 0.4  PROT 7.3  ALBUMIN 3.4*   No results for input(s): LIPASE, AMYLASE in the last 168 hours. No results for input(s): AMMONIA in the last 168 hours. Coagulation Profile: No results for input(s): INR, PROTIME in the last 168 hours. Cardiac Enzymes:  Recent Labs Lab 07/28/16 0020 07/28/16 0637 07/28/16 1325  TROPONINI 0.04* 0.04* 0.04*   BNP (last 3 results) No results for input(s): PROBNP in the last 8760 hours. HbA1C: No results for input(s): HGBA1C in the last 72 hours. CBG:  Recent Labs Lab 08/02/16 1224 08/02/16 1800 08/02/16 2112 08/03/16 0725 08/03/16 1136  GLUCAP 253* 367* 213* 264* 329*   Lipid Profile: No results for input(s): CHOL, HDL, LDLCALC, TRIG, CHOLHDL, LDLDIRECT in the last 72 hours. Thyroid Function Tests: No results for input(s): TSH, T4TOTAL, FREET4, T3FREE, THYROIDAB in the last 72 hours. Anemia Panel: No results for input(s): VITAMINB12, FOLATE, FERRITIN, TIBC, IRON, RETICCTPCT in the last 72 hours. Urine analysis:    Component Value Date/Time   COLORURINE YELLOW 10/26/2014 2206   APPEARANCEUR CLEAR 10/26/2014 2206   LABSPEC 1.017 10/26/2014 2206   PHURINE 6.0 10/26/2014 2206   GLUCOSEU NEGATIVE 10/26/2014 2206   HGBUR NEGATIVE 10/26/2014 2206   BILIRUBINUR NEGATIVE 10/26/2014 2206   KETONESUR NEGATIVE 10/26/2014 2206    PROTEINUR NEGATIVE 10/26/2014 2206   UROBILINOGEN 0.2 10/26/2014 2206   NITRITE NEGATIVE 10/26/2014 2206   LEUKOCYTESUR NEGATIVE 10/26/2014 2206   Sepsis Labs: _0 (procalcitonin:4,lacticidven:4) ) Recent Results (from the past 240 hour(s))  MRSA PCR Screening     Status: None   Collection Time: 07/28/16  3:01 AM  Result Value Ref Range Status   MRSA by PCR NEGATIVE NEGATIVE Final    Comment:  The GeneXpert MRSA Assay (FDA approved for NASAL specimens only), is one component of a comprehensive MRSA colonization surveillance program. It is not intended to diagnose MRSA infection nor to guide or monitor treatment for MRSA infections.          Radiology Studies: No results found.    Scheduled Meds: . carvedilol  25 mg Oral BID WC  . digoxin  0.125 mg Oral Daily  . furosemide  80 mg Intravenous TID AC  . gabapentin  1,800 mg Oral QHS  . gabapentin  600 mg Oral Q lunch  . glipiZIDE  10 mg Oral BID AC  . guaiFENesin  600 mg Oral BID  . hydrALAZINE  25 mg Oral TID  . insulin aspart  0-15 Units Subcutaneous TID WC  . insulin aspart  0-5 Units Subcutaneous QHS  . insulin aspart  4 Units Subcutaneous TID WC  . insulin detemir  30 Units Subcutaneous QPM  . ipratropium  0.5 mg Nebulization TID  . isosorbide mononitrate  30 mg Oral Daily  . levalbuterol  0.63 mg Nebulization TID  . methadone  10 mg Oral Q12H  . mometasone-formoterol  2 puff Inhalation BID  . potassium chloride SA  40 mEq Oral Daily  . rivaroxaban  20 mg Oral Q supper  . rosuvastatin  20 mg Oral Daily  . sacubitril-valsartan  1 tablet Oral BID  . sodium chloride flush  3 mL Intravenous Q12H  . sodium chloride flush  3 mL Intravenous Q12H  . spironolactone  25 mg Oral Daily   Continuous Infusions:    LOS: 6 days    Time spent in minutes: 30 minutes.    Hosie Poisson, MD [facility] Pager: [phone] [email or web address] Password [email or web address] 08/03/2016, 2:50 PM

## 2016-08-04 LAB — CBC
HCT: 37.4 % — ABNORMAL LOW (ref 39.0–52.0)
HEMOGLOBIN: 11.5 g/dL — AB (ref 13.0–17.0)
MCH: 26.7 pg (ref 26.0–34.0)
MCHC: 30.7 g/dL (ref 30.0–36.0)
MCV: 86.8 fL (ref 78.0–100.0)
Platelets: 289 10*3/uL (ref 150–400)
RBC: 4.31 MIL/uL (ref 4.22–5.81)
RDW: 17.4 % — ABNORMAL HIGH (ref 11.5–15.5)
WBC: 9.8 10*3/uL (ref 4.0–10.5)

## 2016-08-04 LAB — BASIC METABOLIC PANEL
Anion gap: 8 (ref 5–15)
BUN: 24 mg/dL — ABNORMAL HIGH (ref 6–20)
CO2: 38 mmol/L — ABNORMAL HIGH (ref 22–32)
Calcium: 8.4 mg/dL — ABNORMAL LOW (ref 8.9–10.3)
Chloride: 91 mmol/L — ABNORMAL LOW (ref 101–111)
Creatinine, Ser: 0.94 mg/dL (ref 0.61–1.24)
GFR calc Af Amer: >60 mL/min (ref >60)
GFR calc non Af Amer: >60 mL/min (ref >60)
Glucose, Bld: 348 mg/dL — ABNORMAL HIGH (ref 65–99)
Potassium: 3.9 mmol/L (ref 3.5–5.1)
Sodium: 137 mmol/L (ref 135–145)

## 2016-08-04 LAB — GLUCOSE, CAPILLARY
GLUCOSE-CAPILLARY: 231 mg/dL — AB (ref 65–99)
GLUCOSE-CAPILLARY: 157 mg/dL — AB (ref 65–99)

## 2016-08-04 MED ORDER — LEVALBUTEROL HCL 0.63 MG/3ML IN NEBU: 0.6300 mg | INHALATION_SOLUTION | Freq: Three times a day (TID) | RESPIRATORY_TRACT | 12 refills | Status: DC | PRN

## 2016-08-04 MED ORDER — INSULIN ASPART 100 UNIT/ML ~~LOC~~ SOLN: SUBCUTANEOUS | 11 refills | Status: DC

## 2016-08-04 MED ORDER — FUROSEMIDE 40 MG PO TABS: 80.0000 mg | ORAL_TABLET | Freq: Three times a day (TID) | ORAL | 0 refills | Status: DC

## 2016-08-04 MED ORDER — INSULIN DETEMIR 100 UNIT/ML ~~LOC~~ SOLN: 30.0000 [IU] | Freq: Every evening | SUBCUTANEOUS | 11 refills | Status: DC

## 2016-08-04 MED ORDER — METHADONE HCL 10 MG PO TABS: 10.0000 mg | ORAL_TABLET | Freq: Two times a day (BID) | ORAL | 0 refills | Status: DC

## 2016-08-04 MED ORDER — IPRATROPIUM BROMIDE 0.02 % IN SOLN: 0.5000 mg | Freq: Four times a day (QID) | RESPIRATORY_TRACT | 12 refills | Status: DC | PRN

## 2016-08-04 NOTE — Progress Notes (Signed)
Nutrition Follow-up  DOCUMENTATION CODES:   Obesity unspecified  INTERVENTION:  Provided "Heart Failure Nutrition Therapy" education from the Academy of [employer]. Reviewed low sodium diet and fluid restriction.  Encouraged patient to choose adequate protein at meals and reviewed options he enjoys.    NUTRITION DIAGNOSIS:   Limited adherence to nutrition-related recommendations related to chronic illness as evidenced by other (see comment) (per rounds).  Ongoing. Addressed with Low Sodium education today.   GOAL:   Patient will meet greater than or equal to 90% of their needs  Meeting calorie needs, only meeting 62% protein needs.  MONITOR:   PO intake, Weight trends, Labs, I & O's  REASON FOR ASSESSMENT:   Malnutrition Screening Tool, Consult Assessment of nutrition requirement/status  ASSESSMENT:   70 y.o. male with medical history significant of non-ischemic cardiomyopathy, chronic systolic CHF EF 10-62%, A-fib, Prolonged Q-T interval, HTN, HLD, COPD on 2 L O2 at home PRN, tobacco abuse, DM Type 2 uncontrolled with complication, and chronic pain. Presented with dyspnea and wheezing started around 7 PM. He has had intermittent fevers for the past few days which was subjective as he does not check his temperature. He has been having productive cough with thick yellow sputum. On arrival to ED he was having wheezing and nebulizer treatment was given after which the wheezing has improved. Patient endorses that he has been retaining more fluid; his weight is 261 pounds which is not very different from baseline but he does endorse lower extremity swelling bilaterally as well as abdominal distention. No chest pain no nausea no vomiting no diarrhea.  Pt reports his appetite is good and that he is finishing 100% of meals. Denies nausea/vomitting, abdominal pain, difficulty chewing/swallowing, and constipation/diarrhea.   Reviewed Heart Failure Nutrition Therapy (low  sodium, fluid restriction). Pt reports he has to drink between 6-8 cups fluid per day (1.5-2 L/day). Utilized teach-back method to verify understanding.  Meal Completion: 100% per chart and patient. In past 24 hours patient has had 1604 kcal (100% minimum kcal needs) and 68 grams protein (62% minimum protein needs).   Medications reviewed and include: Lasix 80 mg TID before meals, Novolog sliding scale TID with meals and daily at bedtime, Novolog 4 units TID with meals, potassium chloride 40 mEq daily, spironolactone 25 mg daily.  Labs reviewed: CBG 231-329 past 24 hrs, Chloride 91, CO2 38.    Diet Order:  Diet heart healthy/carb modified Room service appropriate? Yes; Fluid consistency: Thin  Skin:  Reviewed, no issues  Last BM:  08/02/2016  Height:   Ht Readings from Last 1 Encounters:  07/28/16 6' (1.829 m)    Weight:   Wt Readings from Last 1 Encounters:  08/04/16 285 lb 7.9 oz (129.5 kg)    Ideal Body Weight:  80.91 kg  BMI:  Body mass index is 38.72 kg/m.  Estimated Nutritional Needs:   Kcal:  1450-1650  Protein:  110-120 grams  Fluid:  1.2-1.5 L/day  EDUCATION NEEDS:   Education needs addressed (Heart Failure Nutrition Therapy)  Willey Blade, MS, RD, LDN Pager: [phone] After Hours Pager: [phone]

## 2016-08-04 NOTE — Progress Notes (Signed)
Went over all discharge information with patient and family.  All questions answered. Went over HF teaching and explained importance of taking DW and taking lasix.  Pt stated he had no other questions.  Prescriptions and discharge summary given to patient.  Pt discharged via wheelchair.

## 2016-08-04 NOTE — Progress Notes (Signed)
Pt o2 saturations sustained from 94-96% on RA while ambulating in the hall.  HR sustained in the 130's with ambulation.  MD made aware.

## 2016-08-04 NOTE — Progress Notes (Signed)
Patient Name: Nicholas Caldwell Date of Encounter: 08/04/2016  Primary Cardiologist: Lendell Caprice, MD / J. Allred, MD   Hospital Problem List     Principal Problem:   Acute on chronic systolic CHF Active Problems:   Essential hypertension   Type 2 diabetes mellitus with neuropathy   Hyperlipidemia    COPD exacerbation (HCC)   Morbid obesity (HCC)   Chronic pain   PAF (paroxysmal atrial fibrillation) (HCC)   Nonischemic cardiomyopathy ([place])   CAD - Non-obstructive by LHC1/16   Uncontrolled type 2 diabetes mellitus with complication (HCC)   Paroxysmal atrial flutter (HCC)   Subjective  Breathing is better. Anxious to go home  Inpatient Medications    . carvedilol  25 mg Oral BID WC  . digoxin  0.125 mg Oral Daily  . furosemide  80 mg Intravenous TID AC  . gabapentin  1,800 mg Oral QHS  . gabapentin  600 mg Oral Q lunch  . glipiZIDE  10 mg Oral BID AC  . guaiFENesin  600 mg Oral BID  . hydrALAZINE  25 mg Oral TID  . insulin aspart  0-15 Units Subcutaneous TID WC  . insulin aspart  0-5 Units Subcutaneous QHS  . insulin aspart  4 Units Subcutaneous TID WC  . insulin detemir  30 Units Subcutaneous QPM  . ipratropium  0.5 mg Nebulization TID  . isosorbide mononitrate  30 mg Oral Daily  . levalbuterol  0.63 mg Nebulization TID  . methadone  10 mg Oral Q12H  . mometasone-formoterol  2 puff Inhalation BID  . potassium chloride SA  40 mEq Oral Daily  . rivaroxaban  20 mg Oral Q supper  . rosuvastatin  20 mg Oral Daily  . sacubitril-valsartan  1 tablet Oral BID  . sodium chloride flush  3 mL Intravenous Q12H  . sodium chloride flush  3 mL Intravenous Q12H  . spironolactone  25 mg Oral Daily    Vital Signs    Vitals:   08/03/16 2019 08/03/16 2132 08/04/16 0530 08/04/16 0840  BP:  120/62 123/63   Pulse:  95 88   Resp:  18 18   Temp:  98 F (36.7 C) 99 F (37.2 C)   TempSrc:  Oral Oral   SpO2: 96% 95% 100% 90%  Weight:   285 lb 7.9 oz (129.5 kg)   Height:         Intake/Output Summary (Last 24 hours) at 08/04/16 0911 Last data filed at 08/04/16 0530  Gross per 24 hour  Intake              360 ml  Output             3300 ml  Net            -2940 ml   Filed Weights   08/02/16 0656 08/03/16 0648 08/04/16 0530  Weight: 281 lb 1.6 oz (127.5 kg) 284 lb 9.8 oz (129.1 kg) 285 lb 7.9 oz (129.5 kg)    Physical Exam   GEN: Morbidly obese AA male, on nalsal O2, in no acute distress.  HEENT: Grossly normal.  Neck: Supple, obese, difficult to gauge jvp.  No carotid bruits, or masses. Cardiac: IR, IR, distant, no murmurs, rubs, or gallops. No clubbing, cyanosis, 2-3+ bilat LE edema to calves.  Radials/DP/PT 2+ and equal bilaterally.  Respiratory:  Respirations regular and unlabored GI: obese, protuberant, nontender, BS + x 4. MS: no deformity or atrophy. Skin: warm and dry, no rash. Neuro:  Strength and sensation are intact. Psych: AAOx3.  Normal affect.  Labs    Basic Metabolic Panel  Recent Labs  08/03/16 0523 08/04/16 0442  NA 136 137  K 3.8 3.9  CL 89* 91*  CO2 38* 38*  GLUCOSE 282* 348*  BUN 22* 24*  CREATININE 0.94 0.94  CALCIUM 8.6* 8.4*   Echo 07/01/16 Study Conclusions  - Left ventricle: The cavity size was moderately dilated. Wall   thickness was increased in a pattern of moderate LVH. Systolic   function was severely reduced. The estimated ejection fraction   was in the range of 25% to 30%. - Mitral valve: There was mild to moderate regurgitation. - Left atrium: The atrium was severely dilated. - Right ventricle: The cavity size was mildly dilated. Systolic   function was mildly to moderately reduced. - Right atrium: The atrium was severely dilated. - Tricuspid valve: There was mild-moderate regurgitation. - Pulmonary arteries: Systolic pressure was moderately increased.   PA peak pressure: 54 mm Hg (S).  Telemetry    currently rate-controlled aflutter.  Radiology    CXR 07/27/16-  IMPRESSION: Vascular  congestion and cardiomegaly. Increased interstitial markings raise concern for pulmonary edema.   Patient Profile     70 y.o. obese, AA male with history of nonischemic cardiomyopathy dx 1/99, chronic systolic HF w/ EF of 41-29%, paroxysmal atrial fib/flutter on Xarelto, prolonged QT, HTN, HL, diabetes c/b neuropathy, chronic pain on methadone followed at pain clinic, questionable history of seizures, tobacco abuse, morbid obesity, COPD with chronic respiratory failure, admitted for COPD exacerbation, a/c systolic HF and atrial fibrillation/ flutter w/ RVR.    Assessment & Plan    1.  Acute on chronic systolic CHF/NICM:  EF 04-75% by echo in 06/2016.  Massively volume overloaded on admission.  Diuresing well. I/O negative 28.2 L since admission. Change to lasix 40 PO bid at discharge.  He admits to eating more salt than he should at home prior to admission.   The lasix should work well. We can consider changing to Torsemide if the lasix quits working .   2.  PAF/Flutter:  Asymptomatic. In A flutter this AM.  Cont  blocker, digoxin, xarelto.  3.  Hypertensive heart dzs:  Stable on  blocker, ARNI, spriro, hydral, nitrate.  4.  HL:  Cont crestor.  Last LDL in epic was 72 in 10/2014.  5.  DM II:  Poorly controlled w/ A1c of 10.  Mgmt per IM.  6.  Chronic Pain:  On methadone.  7. Elevated trop:  In setting of CHF.  Mild elevation and flat trend likely represents demand ischemia.  Cath in 11/2014 showed nonobs dzs.  No plan for repeat ischemic eval @ this time.   Plan: I will arrange 7-14 day TOC f/u with [name] who is familiar with the patient. Dr [name] is his primary cardiologist.    [name], PA-C  08/04/2016 9:11 AM    Amesti seen and examined. Agree with above. No crackles on exam. Obese Demand ischemia OK to DC  [name], MD

## 2016-08-04 NOTE — Progress Notes (Signed)
Pt refused HHRN and PT again, states "I don't need those people coming in my house to make money.  I don't want to get up go let them in when they come. "

## 2016-08-05 ENCOUNTER — Other Ambulatory Visit: Payer: Self-pay | Admitting: *Deleted

## 2016-08-05 NOTE — Patient Outreach (Signed)
[facility]) Care Management [facility] Telephone Outreach, Transition of Care Attempt #1 08/05/2016  [name] [birth year] [id number]  Unsuccessful telephone outreach to Nicholas Caldwell, 70 y/o male referred to [place] after recent hospitalization September 24-August 04, 2016 for dyspnea, COPD and CHF exacerbation.  Patient has a history of cardiomyopathy, systolic CHF with EF of 94-58%, A-Fib, HTN, HLD, COPD, ongoing tobacco abuse, Type II DM, chronic pain, and morbid obesity.  Patient has in-home O2 which he uses prn at 2 L/min.  Patient was discharged from hospital to self-care, as he refused home health [facility]) services for RN and PT.  Today, patient's wife [name], on [facility] written consent, answered the phone and verified HIPAA/ identity of patient, but immediately stated that she could not talk to me right now, stating that she is "expecting a very important phone call."  Patient's wife asked that I call her back "in a day or two."  Plan:  Will re-attempt [place] telephone outreach later this week, as requested by patient's wife.  Oneta Rack, RN, BSN, [employer] [facility] Care Management  [phone]

## 2016-08-06 ENCOUNTER — Telehealth (HOSPITAL_COMMUNITY): Payer: Self-pay | Admitting: Vascular Surgery

## 2016-08-06 DIAGNOSIS — M4696 Unspecified inflammatory spondylopathy, lumbar region: Secondary | ICD-10-CM | POA: Diagnosis not present

## 2016-08-06 DIAGNOSIS — Z79891 Long term (current) use of opiate analgesic: Secondary | ICD-10-CM | POA: Diagnosis not present

## 2016-08-06 DIAGNOSIS — M5137 Other intervertebral disc degeneration, lumbosacral region: Secondary | ICD-10-CM | POA: Diagnosis not present

## 2016-08-06 DIAGNOSIS — Z79899 Other long term (current) drug therapy: Secondary | ICD-10-CM | POA: Diagnosis not present

## 2016-08-06 DIAGNOSIS — G894 Chronic pain syndrome: Secondary | ICD-10-CM | POA: Diagnosis not present

## 2016-08-06 DIAGNOSIS — M47817 Spondylosis without myelopathy or radiculopathy, lumbosacral region: Secondary | ICD-10-CM | POA: Diagnosis not present

## 2016-08-06 NOTE — Telephone Encounter (Signed)
Left pt message to make NP APPT

## 2016-08-08 ENCOUNTER — Other Ambulatory Visit: Payer: Self-pay | Admitting: *Deleted

## 2016-08-08 ENCOUNTER — Telehealth (HOSPITAL_COMMUNITY): Payer: Self-pay | Admitting: Vascular Surgery

## 2016-08-08 NOTE — Telephone Encounter (Signed)
have not been able to reach pt, I will send pt letter with appt date and time for NP appt

## 2016-08-08 NOTE — Patient Outreach (Signed)
[facility]) Care Management [facility] Telephone Outreach, Transition of Care Attempt #2 08/08/2016  Eliot Popper [birth year] [id number]   Unsuccessful telephone outreach to Nicholas Caldwell, 70 y/o male referred to [place] after recent hospitalization September 24-August 04, 2016 for dyspnea, COPD and CHF exacerbation.  Patient has a history of cardiomyopathy, systolic CHF with EF of 90-90%, A-Fib, HTN, HLD, COPD, ongoing tobacco abuse, Type II DM, chronic pain, and morbid obesity.  Patient has in-home O2 which he uses prn at 2 L/min.  Patient was discharged from hospital to self-care, as he refused home health [facility]) services for RN and PT.  HIPAA compliant voice mail message left for patient, asking for return call.  Plan:  Will re-attempt [facility] CM telephone outreach next week, if I do not hear back from the patient first.  Oneta Rack, RN, BSN, [place] Coordinator [facility] Care Management  [phone]

## 2016-08-11 ENCOUNTER — Encounter: Payer: Self-pay | Admitting: *Deleted

## 2016-08-11 ENCOUNTER — Telehealth: Payer: Self-pay | Admitting: Interventional Cardiology

## 2016-08-11 ENCOUNTER — Other Ambulatory Visit: Payer: Self-pay | Admitting: *Deleted

## 2016-08-11 NOTE — Telephone Encounter (Signed)
New message    TOC appt on  10.16.2017 with Ether Griffins, [place] Scheduling        This pt needs to see an APP [name] knows him) or Dr [name] in 7-14 days as a TOC CHF follow up

## 2016-08-11 NOTE — Patient Outreach (Signed)
[facility]) Care Management [place] Telephone Outreach, Transition of Care call attempt #3 08/11/2016  Nicholas Caldwell [birth year] [id number]  Unsuccessful telephone outreach to [name], 70 y/o male referred to [place] after recent hospitalization September 24-August 04, 2016 for dyspnea, COPD and CHF exacerbation. Patient has a history of cardiomyopathy, systolic CHF with EF of 61-60%, A-Fib, HTN, HLD, COPD, ongoing tobacco abuse, Type II DM, chronic pain, and morbid obesity. Patient has in-home O2 which he uses prn at 2 L/min. Patient was discharged from hospital to self-care, as he refused home health [facility]) services for RN and PT.  HIPAA compliant voice mail message left for patient, asking for return call.  Plan:  Will send patient [place] CM closure letter, as unable to contact patient after 3 telephone outreach attempts; will close [facility] CM case in 10 business days if no response back from patient.  Oneta Rack, RN, BSN, [employer] [facility] Care Management  [phone]

## 2016-08-11 NOTE — Discharge Summary (Addendum)
Physician Discharge Summary  [name] IRC:[id number] DOB: [birth year] DOA: 07/27/2016  PCP: [place] date: 07/27/2016 Discharge date: 08/04/2016  Admitted From: [facility].  Disposition:  Home.   Recommendations for Outpatient Follow-up:  1. Follow up with PCP in 1-2 weeks 2. Please obtain BMP/CBC in one week 3. Please follow up [place] as recommended. :    Discharge Condition:stable.  CODE STATUS:DNR Diet recommendation: Heart Healthy / Carb Modified /   Brief/Interim Summary: Nicholas Caldwell a 70 y.o.malewith medical history significant of non-ischemic cardiomyopathy, Chronic Systolic CHF EF 51-02%, A-fibon Xarelto,Prolonged Q-T interval , HTN, HLD, COPD on 2 L O2 at home PRN,,Tobacco Abuse, DM Type 2 uncontrolled, chronic pain on Methadonewho presents with shortness of breath which started while he was watching TV at 7pm. He was noted to be wheezing in the ER which improved after a breathing treatment. He felt he had increasing pedal edema. He was admitted for dyspnea.   Discharge Diagnoses:  Principal Problem:   Acute on chronic systolic CHF Active Problems:   Essential hypertension   Type 2 diabetes mellitus with neuropathy   Hyperlipidemia    COPD exacerbation (HCC)   Morbid obesity (HCC)   Chronic pain   PAF (paroxysmal atrial fibrillation) (HCC)   Nonischemic cardiomyopathy ([place])   CAD - Non-obstructive by LHC1/16   Uncontrolled type 2 diabetes mellitus with complication (HCC)   Paroxysmal atrial flutter (HCC)  PAF (paroxysmal atrial fibrillation)/ SVT/ mildly elevated troponin Rate controlled aflutter. cardiology consulted and recommendations given.  - currently on XArelto, Dig,  Bb.  - TSH normal   Dyspnea- ? Acute on chronic systolic CHF, COPD with chronic resp failure - on 2 L O2 at home - on diuresis with IV lasix TID/ , changed to po lasix on discharge.   Completed doxycycline.  Repeat BMP shows normal potassium.        Essential hypertension  - controlled on current medications    Type 2 diabetes mellitus with diabetic neuropathy, with long-term current use of insulin -  Metformin on hold - On Glucotrol as sugars, SSI and Levemir , increased to levemir 30 units daily on discharge.     Hyperlipidemia associated with type 2 diabetes mellitus   - Crestor    Morbid obesity  Body mass index is 38.6 kg/m.    Chronic pain - Methadone    CAD (coronary artery disease) - Non-obstructive by LHC1/16    Discharge Instructions  Discharge Instructions    (HEART FAILURE PATIENTS) Call MD:  Anytime you have any of the following symptoms: 1) 3 pound weight gain in 24 hours or 5 pounds in 1 week 2) shortness of breath, with or without a dry hacking cough 3) swelling in the hands, feet or stomach 4) if you have to sleep on extra pillows at night in order to breathe.    Complete by:  As directed    AMB Referral to [place] Management    Complete by:  As directed    Please assign to [place] for COPD, CHF, DM symptom and disease management. Written consent obtained. Has had x3 hospital admits in 6 months. Goes to [facility] for PCP but has [place]. Currently at [facility]. Please call with questions. Marthenia Rolling, [place], [facility] Liaison-[phone]   Reason for consult:  Please assign to [facility] RNCM   Diagnoses of:   COPD/ Pneumonia Diabetes Heart Failure     Expected date of contact:  1-3  days (reserved for hospital discharges)   Diet - low sodium heart healthy    Complete by:  As directed    Discharge instructions    Complete by:  As directed    Please follow up with cardiology as recommended.  [facility]       Medication List    TAKE these medications   albuterol 108 (90 Base) MCG/ACT inhaler Commonly known as:  PROVENTIL HFA;VENTOLIN HFA Inhale 2 puffs into the lungs every 6 (six) hours as needed for  wheezing or shortness of breath.   budesonide-formoterol 160-4.5 MCG/ACT inhaler Commonly known as:  SYMBICORT Inhale 2 puffs into the lungs 2 (two) times daily. Rinse mouth well with water after each use   carvedilol 25 MG tablet Commonly known as:  COREG Take 25 mg by mouth 2 (two) times daily with a meal.   diazepam 10 MG tablet Commonly known as:  VALIUM Take 0.5 tablets (5 mg total) by mouth daily as needed for anxiety or sleep.   diclofenac sodium 1 % Gel Commonly known as:  VOLTAREN Apply 2 g topically 2 (two) times daily as needed (for pain).   digoxin 0.125 MG tablet Commonly known as:  LANOXIN Take 1 tablet (0.125 mg total) by mouth daily.   fluticasone 50 MCG/ACT nasal spray Commonly known as:  FLONASE Place 2 sprays into both nostrils daily.   furosemide 40 MG tablet Commonly known as:  LASIX Take 2 tablets (80 mg total) by mouth 3 (three) times daily.   gabapentin 600 MG tablet Commonly known as:  NEURONTIN Take 1-3 tablets (600-1,800 mg total) by mouth See admin instructions. Take 1 tablet (600 mg) by mouth daily with lunch,3 tablets (1800 mg) at bedtime   glipiZIDE 10 MG tablet Commonly known as:  GLUCOTROL Take 10 mg by mouth 2 (two) times daily before a meal.   glucose 4 GM chewable tablet Chew 4 tablets by mouth See admin instructions. Chew 4 tablets by mouth as needed for low blood sugar - repeat every 15 minutes if blood sugar less than 70   hydrALAZINE 25 MG tablet Commonly known as:  APRESOLINE Take 1 tablet (25 mg total) by mouth 3 (three) times daily.   hydrocortisone 1 % lotion Apply 1 application topically See admin instructions. Apply small amount to back twice daily for itchy rash   hydrocortisone 2.5 % rectal cream Commonly known as:  ANUSOL-HC Place 1 application rectally 2 (two) times daily as needed for hemorrhoids or itching. Use up to 2 weeks at a time as needed   insulin aspart 100 UNIT/ML injection Commonly known as:   novoLOG CBG 70 - 120: 0 units CBG 121 - 150: 2 units CBG 151 - 200: 3 units CBG 201 - 250: 5 units CBG 251 - 300: 8 units CBG 301 - 350: 11 units CBG 351 - 400: 15 units   insulin detemir 100 UNIT/ML injection Commonly known as:  LEVEMIR Inject 0.3 mLs (30 Units total) into the skin every evening. What changed:  how much to take  when to take this   ipratropium 0.02 % nebulizer solution Commonly known as:  ATROVENT Take 2.5 mLs (0.5 mg total) by nebulization every 6 (six) hours as needed for wheezing or shortness of breath.   isosorbide mononitrate 30 MG 24 hr tablet Commonly known as:  IMDUR Take 1 tablet (30 mg total) by mouth daily.   levalbuterol 0.63 MG/3ML nebulizer solution Commonly known as:  XOPENEX Take 3 mLs (0.63 mg total) by nebulization every 8 (eight) hours as needed for wheezing or shortness of breath.   metFORMIN 500 MG 24 hr tablet Commonly known as:  GLUCOPHAGE-XR Take 1,000 mg by mouth 2 (two) times daily with a meal.   methadone 10 MG tablet Commonly known as:  DOLOPHINE Take 1 tablet (10 mg total) by mouth every 12 (twelve) hours. What changed:  when to take this   potassium chloride SA 20 MEQ tablet Commonly known as:  K-DUR,KLOR-CON Take 2 tablets (40 mEq total) by mouth daily.   rivaroxaban 20 MG Tabs tablet Commonly known as:  XARELTO Take 1 tablet (20 mg total) by mouth daily with supper.   rosuvastatin 40 MG tablet Commonly known as:  CRESTOR Take 20 mg by mouth daily. For cholesterol   sacubitril-valsartan 49-51 MG Commonly known as:  ENTRESTO Take 1 tablet by mouth 2 (two) times daily.   spironolactone 25 MG tablet Commonly known as:  ALDACTONE Take 1 tablet (25 mg total) by mouth daily.       No Known Allergies  Consultations:  Cardiology.    Procedures/Studies: Dg Chest 2 View  Result Date: 07/27/2016 CLINICAL DATA:  Acute onset of shortness of breath and productive cough. Mild wheezing. Initial encounter. EXAM: CHEST   2 VIEW COMPARISON:  Chest radiograph performed 06/29/2016 FINDINGS: Vascular congestion is noted. Increased interstitial markings raise concern for pulmonary edema. No definite pleural effusion or pneumothorax is seen. The heart is enlarged. No acute osseous abnormalities are identified. IMPRESSION: Vascular congestion and cardiomegaly. Increased interstitial markings raise concern for pulmonary edema. Electronically Signed   By: Garald Balding M.D.   On: 07/27/2016 22:18       Subjective: No new complaints.   Discharge Exam: Vitals:   08/03/16 2132 08/04/16 0530  BP: 120/62 123/63  Pulse: 95 88  Resp: 18 18  Temp: 98 F (36.7 C) 99 F (37.2 C)   Vitals:   08/03/16 2132 08/04/16 0530 08/04/16 0840 08/04/16 1006  BP: 120/62 123/63    Pulse: 95 88    Resp: 18 18    Temp: 98 F (36.7 C) 99 F (37.2 C)    TempSrc: Oral Oral    SpO2: 95% 100% 90% 94%  Weight:  129.5 kg (285 lb 7.9 oz)    Height:        General: Pt is alert, awake, not in acute distress Cardiovascular: RRR, S1/S2 +, no rubs, no gallops Respiratory: CTA bilaterally, no wheezing, no rhonchi Abdominal: Soft, NT, ND, bowel sounds + Extremities: no edema, no cyanosis    The results of significant diagnostics from this hospitalization (including imaging, microbiology, ancillary and laboratory) are listed below for reference.     Microbiology: No results found for this or any previous visit (from the past 240 hour(s)).   Labs: BNP (last 3 results)  Recent Labs  05/25/16 1904 06/29/16 1946 07/27/16 2324  BNP 774.6* 660.8* 540.0*   Basic Metabolic Panel: No results for input(s): NA, K, CL, CO2, GLUCOSE, BUN, CREATININE, CALCIUM, MG, PHOS in the last 168 hours. Liver Function Tests: No results for input(s): AST, ALT, ALKPHOS, BILITOT, PROT, ALBUMIN in the last 168 hours. No results for input(s): LIPASE, AMYLASE in the last 168 hours. No results for input(s): AMMONIA in the last 168 hours. CBC: No  results for input(s): WBC, NEUTROABS, HGB, HCT, MCV, PLT in the last 168 hours. Cardiac Enzymes: No results for input(s): CKTOTAL, CKMB, CKMBINDEX, TROPONINI in the last  168 hours. BNP: Invalid input(s): POCBNP CBG: No results for input(s): GLUCAP in the last 168 hours. D-Dimer No results for input(s): DDIMER in the last 72 hours. Hgb A1c No results for input(s): HGBA1C in the last 72 hours. Lipid Profile No results for input(s): CHOL, HDL, LDLCALC, TRIG, CHOLHDL, LDLDIRECT in the last 72 hours. Thyroid function studies No results for input(s): TSH, T4TOTAL, T3FREE, THYROIDAB in the last 72 hours.  Invalid input(s): FREET3 Anemia work up No results for input(s): VITAMINB12, FOLATE, FERRITIN, TIBC, IRON, RETICCTPCT in the last 72 hours. Urinalysis    Component Value Date/Time   COLORURINE YELLOW 10/26/2014 2206   APPEARANCEUR CLEAR 10/26/2014 2206   LABSPEC 1.017 10/26/2014 2206   PHURINE 6.0 10/26/2014 2206   GLUCOSEU NEGATIVE 10/26/2014 2206   HGBUR NEGATIVE 10/26/2014 2206   BILIRUBINUR NEGATIVE 10/26/2014 2206   KETONESUR NEGATIVE 10/26/2014 2206   PROTEINUR NEGATIVE 10/26/2014 2206   UROBILINOGEN 0.2 10/26/2014 2206   NITRITE NEGATIVE 10/26/2014 2206   LEUKOCYTESUR NEGATIVE 10/26/2014 2206   Sepsis Labs Invalid input(s): PROCALCITONIN,  WBC,  LACTICIDVEN Microbiology No results found for this or any previous visit (from the past 240 hour(s)).   Time coordinating discharge: Over 30 minutes  SIGNED:   Hosie Poisson, MD  [facility] 08/11/2016, 12:01 PM Pager [phone]  If 7PM-7AM, please contact night-coverage [email or web address] Password [email or web address]

## 2016-08-11 NOTE — Telephone Encounter (Signed)
LMTCB.

## 2016-08-12 NOTE — Telephone Encounter (Signed)
Left message for patient to call back

## 2016-08-12 NOTE — Telephone Encounter (Signed)
Patient contacted regarding discharge from [facility] on 08/04/16.  Patient understands to follow up with provider [name], PA on 10/16 at 3:15 pm at [facility]. Patient understands discharge instructions? Yes Patient understands medications and regiment? Yes Patient understands to bring all medications to this visit? Yes

## 2016-08-18 ENCOUNTER — Other Ambulatory Visit: Payer: Self-pay | Admitting: *Deleted

## 2016-08-18 ENCOUNTER — Encounter: Payer: Self-pay | Admitting: Physician Assistant

## 2016-08-18 ENCOUNTER — Ambulatory Visit (INDEPENDENT_AMBULATORY_CARE_PROVIDER_SITE_OTHER): Payer: Medicare Other | Admitting: Physician Assistant

## 2016-08-18 VITALS — BP 160/64 | HR 110 | Ht 72.0 in | Wt 292.0 lb

## 2016-08-18 DIAGNOSIS — I10 Essential (primary) hypertension: Secondary | ICD-10-CM

## 2016-08-18 DIAGNOSIS — I2510 Atherosclerotic heart disease of native coronary artery without angina pectoris: Secondary | ICD-10-CM

## 2016-08-18 DIAGNOSIS — I481 Persistent atrial fibrillation: Secondary | ICD-10-CM

## 2016-08-18 DIAGNOSIS — I4819 Other persistent atrial fibrillation: Secondary | ICD-10-CM

## 2016-08-18 DIAGNOSIS — E7800 Pure hypercholesterolemia, unspecified: Secondary | ICD-10-CM

## 2016-08-18 DIAGNOSIS — R9431 Abnormal electrocardiogram [ECG] [EKG]: Secondary | ICD-10-CM

## 2016-08-18 DIAGNOSIS — I428 Other cardiomyopathies: Secondary | ICD-10-CM | POA: Diagnosis not present

## 2016-08-18 DIAGNOSIS — I5022 Chronic systolic (congestive) heart failure: Secondary | ICD-10-CM | POA: Diagnosis not present

## 2016-08-18 MED ORDER — METOLAZONE 2.5 MG PO TABS: 2.5000 mg | ORAL_TABLET | ORAL | 1 refills | Status: DC

## 2016-08-18 MED ORDER — ISOSORBIDE MONONITRATE ER 30 MG PO TB24: 30.0000 mg | ORAL_TABLET | Freq: Every day | ORAL | 3 refills | Status: DC

## 2016-08-18 NOTE — Patient Instructions (Addendum)
Medication Instructions:  1. RESUME IMDUR 30 MG DAILY  2. START METOLAZONE 2.5 MG TABLET; WITH DIRECTIONS TO READ: TAKE 1 TABLET 2.5 MG EVERY Tuesday 30 MINUTES BEFORE YOUR MORNING DOSE OF LASIX   3. EVERY Tuesday YOU WILL ALSO NEED TO TAKE AN EXTRA POTASSIUM 40 MEQ   Labwork: NEXT WED 08/27/16 LAB WORK TO BE DONE (BMET)  Testing/Procedures: NONE  Follow-Up: KEEP YOUR HEART FAILURE APPT NEXT WEEK  YOU WILL NEED TO SEE DR. VARANASI ON 10/20/16 @ 3:45   Any Other Special Instructions Will Be Listed Below (If Applicable).  If you need a refill on your cardiac medications before your next appointment, please call your pharmacy.

## 2016-08-18 NOTE — Progress Notes (Signed)
Cardiology Office Note:    Date:  08/18/2016   ID:  Nicholas Caldwell, DOB 1946/09/01, MRN [id number]  PCP:  [facility]  Cardiologist: Dr. Casandra Doffing [facility], PA-C)  Electrophysiologist: Dr. Thompson Grayer  Referring MD: [facility]   Chief Complaint  Patient presents with  . Hospitalization Follow-up    CHF    History of Present Illness:    Nicholas Caldwell is a 70 y.o. male with a hx of nonischemic cardiomyopathy dx 8/84, systolic HF, AFib/Flutter, HTN, HL, diabetes, tobacco abuse. Cardiac catheterization January 2016 demonstrated mild nonobstructive CAD.   Admitted in 1/66 with a/c systolic HF and elevated Troponin levels 2/2 demand ischemia in the setting of atrial fibrillation/flutter with RVR. CHADS2-VASc=4. He was placed on Xarelto for anticoagulation. He was seen by EP and was not felt to be a candidate for flecainide secondary cardiomyopathy. He was not felt to be a candidate for Tikosyn given his prolonged QT. Success of ablation would be limited due to left atrial size. He was screened for Genetic AF and was not felt to be a good candidate. It was ultimately decided to place him on amiodarone for rhythm control.   He has had transient episodes of AFlutter noted in the office in the past. I have adjusted his Amiodarone. I had him FU with EP to see if he can be considered for RFCA as his arrhythmia may be contributing to his DCM. He saw Dr. Rayann Caldwell 10/01/15. Apparently he declined ICD at that time.   In 3/17, he was again back in AFlutter in the office.  I reviewed his case again with Dr. Rayann Caldwell.  He felt the patient would not benefit from RFCA of AFlutter as this would likely result in more AFib.  He felt like the patient should be continued on rate control Rx.  I saw him last in 5/17.  He was volume overloaded and I adjusted his diuretics.  Since I last saw him, he has been admitted x 3. Admitted in 7/17 for AECOPD. He was then admitted 8/27-9/1  with a/c hypoxemic hypercarbic respiratory failure felt to be 2/2 a/c CHF and AECOPD. Of note, his Amiodarone was DC'd 2/2 prolonged QT during this admission.  He FU in our clinic with Nicholas Copa, PA-C on 9/19.  He was volume overloaded.  It was questioned if he was adherent with his diuretics and the dose was adjusted with plans for close FU.  He was then admitted 0/63-01/6 with a/c systolic CHF in the setting of AECOPD with assoc AF with RVR.  He was followed by Cardiology.  He diuresed > 25L.  DC weight was 285 lbs (notes indicate questionable accuracy with his weights).  He was tx with rate control for his AFib.  He returns for close post hospital FU.  Of note, [facility] has tried unsuccessfully x 3 to FU with him since DC.    He returns for FU.  He is here with his wife.  He denies any chest pain, syncope.  His shortness of breath is unchanged. He sleeps on 2 pillows.  He denies PND.  He denies any bleeding issues. He has a chronic cough.    Prior CV studies that were reviewed today include:    Echo 07/01/16  Mod LVH, EF 25-30, mild to mod MR, severe LAE, mild to mod reduced RVSF, severe RAE, mild to mod TR, PASP 54  Echo 02/22/15 EF 25-30%, diffuse HK, mild MR, severe LAE  LHC 11/06/14 LM:  Patent LAD: Mid vessel moderate diffuse disease LCx: Mild proximal disease. Distal disease - vessel too small for PCI RCA: Patent EF 25-30%, LVEDP 26 Conclusion: Mild diffuse non-obstructive CAD; NICM  Myoview 11/02/14 IMPRESSION: 1. Inferoapical scar/infarction. No findings for myocardial ischemia. 2. Mild global hypokinesis and no contraction or wall thickening in the area of scar. 3. Left ventricular ejection fraction 32% 4. High-risk stress test findings*.  Past Medical History:  Diagnosis Date  . Atrial flutter ([place])    a. recurrent AFlutter with RVR;  b. Amiodarone Rx started 4/16  . CAD (coronary artery disease)    a. LHC 1/16:  mLAD diffuse disease, pLCx mild disease,  dLCx with disease but too small for PCI, RCA ok, EF 25-30%  . Chronic pain   . Chronic systolic CHF (congestive heart failure) ([place])   . COPD (chronic obstructive pulmonary disease) ([place])   . Diabetes mellitus without complication ([place])   . Hypercholesteremia   . Hypertension   . NICM (nonischemic cardiomyopathy) ([place])    a.dx 2016. b. 2D echo 06/2016 - Last echo 07/01/16: mod dilated LV, mod LVH, EF 25-30%, mild-mod MR, sev LAE, mild-mod reduced RV systolic function, mild-mod TR, PASP 81mHG.  .Marland KitchenPAF (paroxysmal atrial fibrillation) ([facility])    On amio - ot a candidate for flecainide due to cardiomyopathy, not a candidate for Tikosyn due to prolonged QT, and felt to be a poor candidate for ablation given left atrial size.  . Prolonged Q-T interval on ECG   . Pulmonary hypertension   . Tobacco abuse     Past Surgical History:  Procedure Laterality Date  . LEFT AND RIGHT HEART CATHETERIZATION WITH CORONARY ANGIOGRAM N/A 11/06/2014   Procedure: LEFT AND RIGHT HEART CATHETERIZATION WITH CORONARY ANGIOGRAM;  Surgeon: JJettie Booze MD;  [facility];  Service: Cardiovascular;  Laterality: N/A;  . SPLENECTOMY      Current Medications: Current Meds  Medication Sig  . albuterol (PROVENTIL HFA;VENTOLIN HFA) 108 (90 BASE) MCG/ACT inhaler Inhale 2 puffs into the lungs every 6 (six) hours as needed for wheezing or shortness of breath.  . carvedilol (COREG) 25 MG tablet Take 25 mg by mouth 2 (two) times daily with a meal.  . diazepam (VALIUM) 10 MG tablet Take 0.5 tablets (5 mg total) by mouth daily as needed for anxiety or sleep.  .Marland Kitchendiclofenac sodium (VOLTAREN) 1 % GEL Apply 2 g topically 2 (two) times daily as needed (for pain).  .Marland Kitchendigoxin (LANOXIN) 0.125 MG tablet Take 1 tablet (0.125 mg total) by mouth daily.  . fluticasone (FLONASE) 50 MCG/ACT nasal spray Place 2 sprays into both nostrils daily.  . furosemide (LASIX) 40 MG tablet Take 2 tablets (80 mg total) by mouth 3 (three) times  daily.  .Marland Kitchengabapentin (NEURONTIN) 600 MG tablet Take 1-3 tablets (600-1,800 mg total) by mouth See admin instructions. Take 1 tablet (600 mg) by mouth daily with lunch,3 tablets (1800 mg) at bedtime  . glipiZIDE (GLUCOTROL) 10 MG tablet Take 10 mg by mouth 2 (two) times daily before a meal.   . glucose 4 GM chewable tablet Chew 4 tablets by mouth See admin instructions. Chew 4 tablets by mouth as needed for low blood sugar - repeat every 15 minutes if blood sugar less than 70  . hydrALAZINE (APRESOLINE) 25 MG tablet Take 1 tablet (25 mg total) by mouth 3 (three) times daily.  . hydrocortisone (ANUSOL-HC) 2.5 % rectal cream Place 1 application rectally 2 (two) times daily as needed  for hemorrhoids or itching. Use up to 2 weeks at a time as needed  . hydrocortisone 1 % lotion Apply 1 application topically See admin instructions. Apply small amount to back twice daily for itchy rash  . insulin aspart (NOVOLOG) 100 UNIT/ML injection CBG 70 - 120: 0 units CBG 121 - 150: 2 units CBG 151 - 200: 3 units CBG 201 - 250: 5 units CBG 251 - 300: 8 units CBG 301 - 350: 11 units CBG 351 - 400: 15 units  . insulin detemir (LEVEMIR) 100 UNIT/ML injection Inject 0.3 mLs (30 Units total) into the skin every evening.  Marland Kitchen ipratropium (ATROVENT) 0.02 % nebulizer solution Take 2.5 mLs (0.5 mg total) by nebulization every 6 (six) hours as needed for wheezing or shortness of breath.  . isosorbide mononitrate (IMDUR) 30 MG 24 hr tablet Take 1 tablet (30 mg total) by mouth daily.  Marland Kitchen levalbuterol (XOPENEX) 0.63 MG/3ML nebulizer solution Take 3 mLs (0.63 mg total) by nebulization every 8 (eight) hours as needed for wheezing or shortness of breath.  . metFORMIN (GLUCOPHAGE-XR) 500 MG 24 hr tablet Take 1,000 mg by mouth 2 (two) times daily with a meal.  . methadone (DOLOPHINE) 10 MG tablet Take 1 tablet (10 mg total) by mouth every 12 (twelve) hours.  . mometasone-formoterol (DULERA) 200-5 MCG/ACT AERO Inhale 2 puffs into the  lungs 2 (two) times daily.  . potassium chloride SA (K-DUR,KLOR-CON) 20 MEQ tablet Take 2 tablets (40 mEq total) by mouth daily.  . rivaroxaban (XARELTO) 20 MG TABS tablet Take 1 tablet (20 mg total) by mouth daily with supper.  . rosuvastatin (CRESTOR) 40 MG tablet Take 20 mg by mouth daily. For cholesterol  . sacubitril-valsartan (ENTRESTO) 49-51 MG Take 1 tablet by mouth 2 (two) times daily.  Marland Kitchen spironolactone (ALDACTONE) 25 MG tablet Take 1 tablet (25 mg total) by mouth daily.     Allergies:   Review of patient's allergies indicates no known allergies.   Social History   Social History  . Marital status: Single    Spouse name: N/A  . Number of children: N/A  . Years of education: N/A   Social History Main Topics  . Smoking status: Current Every Day Smoker    Packs/day: 0.05    Types: Cigarettes    Last attempt to quit: 08/30/2015  . Smokeless tobacco: Never Used  . Alcohol use No  . Drug use: No  . Sexual activity: Not Asked   Other Topics Concern  . None   Social History Narrative  . None     Family History:  The patient's family history includes Heart disease in his father and mother; Heart failure in his mother; Hypertension in his mother and sister.   ROS:   Please see the history of present illness.    ROS All other systems reviewed and are negative.   EKGs/Labs/Other Test Reviewed:    EKG:  EKG is  ordered today.  The ekg ordered today demonstrates AFlutter, HR 109  Recent Labs: 07/27/2016: B Natriuretic Peptide 414.0 07/28/2016: ALT 22; Magnesium 1.8; TSH 1.745 08/04/2016: BUN 24; Creatinine, Ser 0.94; Hemoglobin 11.5; Platelets 289; Potassium 3.9; Sodium 137   Recent Lipid Panel    Component Value Date/Time   CHOL 147 11/01/2014 0216   TRIG 134 11/01/2014 0216   HDL 48 11/01/2014 0216   CHOLHDL 3.1 11/01/2014 0216   VLDL 27 11/01/2014 0216   LDLCALC 72 11/01/2014 0216     Physical Exam:  VS:  BP (!) 160/64   Pulse (!) 110   Ht 6' (1.829 m)    Wt 292 lb (132.5 kg)   SpO2 96%   BMI 39.60 kg/m     Wt Readings from Last 3 Encounters:  08/18/16 292 lb (132.5 kg)  08/04/16 285 lb 7.9 oz (129.5 kg)  07/22/16 261 lb 12.8 oz (118.8 kg)     Physical Exam  Constitutional: He is oriented to person, place, and time. He appears well-developed and well-nourished. No distress.  HENT:  Head: Normocephalic and atraumatic.  Eyes: No scleral icterus.  Neck:  I cannot appreciate JVD at 90 degrees  Cardiovascular: S1 normal and S2 normal.  An irregularly irregular rhythm present.  No murmur heard. Pulmonary/Chest: He has decreased breath sounds. He has no wheezes. He has no rales.  Abdominal: There is no tenderness.  Musculoskeletal: He exhibits edema.  1-2+ bilateral LE edema  Neurological: He is alert and oriented to person, place, and time.  Skin: Skin is warm and dry.  Psychiatric: He has a normal mood and affect.    ASSESSMENT:    1. Chronic systolic CHF (congestive heart failure) ([place])   2. Nonischemic cardiomyopathy ([place])   3. Coronary artery disease involving native coronary artery of native heart without angina pectoris   4. Persistent atrial fibrillation ([place])   5. Prolonged QT interval   6. Essential hypertension   7. Pure hypercholesterolemia    PLAN:    In order of problems listed above:  1. Chronic systolic CHF (congestive heart failure) - He is NYHA 2b-3.  He remains volume overloaded.  His weights at home have remained ~ 284 lbs since DC.  He is on a high dose of Lasix.  He sees CHF Clinic next week for evaluation as well.  -  Add Metolazone 2.5 mg once a week  -  Take extra K+ 40 mEq on Metolazone days  -  BMET 1 week  -  Continue Coreg, Hydralazine, Entresto, Spironolactone  -  Restart Imdur 30 mg QD  -  FU with Dr. Haroldine Laws in [place] clinic next week as planned.   2. NICM (nonischemic cardiomyopathy): Question tachycardia mediated.  But, options are poor for rhythm control.  He has declined ICD in the  past.  3. Coronary artery disease - Nonobstructive coronary artery disease by cardiac catheterization. No angina.  He is not on ASA as he is on Xarelto.  Continue statin.  4. Atrial fibrillation and flutter: Recent admit with AF with RVR in the setting of a/c CHF and AECOPD. Rate controlling medications were adjusted. He is no longer on Amiodarone due to prolonged QT.  HR is elevated today.  Continue Digoxin, Coreg.  With diuresis, if HR remains elevated, consider increasing Coreg to 37.5 mg bid (he is > 85 kg).  Ivabridine is also a consideration for treatment of CHF if his HR remains > 70.  5. Prolonged Q-T interval on ECG: Avoid QT prolonging drugs. Methadone dose was adjusted during one of his recent admissions.  He is no longer on Amiodarone.   6. Essential hypertension: BP elevated.  Resume Isosorbide and adjust diuretics as noted.  7. Hyperlipidemia: Continue statin.   Medication Adjustments/Labs and Tests Ordered: Current medicines are reviewed at length with the patient today.  Concerns regarding medicines are outlined above.  Medication changes, Labs and Tests ordered today are outlined in the Patient Instructions noted below. Patient Instructions  Medication Instructions:  1. RESUME IMDUR 30 MG DAILY  2. START METOLAZONE 2.5 MG TABLET; WITH DIRECTIONS TO READ: TAKE 1 TABLET 2.5 MG EVERY Tuesday 30 MINUTES BEFORE YOUR MORNING DOSE OF LASIX   3. EVERY Tuesday YOU WILL ALSO NEED TO TAKE AN EXTRA POTASSIUM 40 MEQ   Labwork: NEXT WED 08/27/16 LAB WORK TO BE DONE (BMET)  Testing/Procedures: NONE  Follow-Up: KEEP YOUR HEART FAILURE APPT NEXT WEEK  YOU WILL NEED TO SEE DR. VARANASI ON 10/20/16 @ 3:45   Any Other Special Instructions Will Be Listed Below (If Applicable).  If you need a refill on your cardiac medications before your next appointment, please call your pharmacy.  Signed, [name], PA-C  08/18/2016 4:39 PM    [facility] [address], [place], [place]  [address] Phone: [phone]; Fax: [phone]

## 2016-08-25 ENCOUNTER — Encounter: Payer: Self-pay | Admitting: *Deleted

## 2016-08-27 ENCOUNTER — Other Ambulatory Visit: Payer: Medicare Other

## 2016-08-29 ENCOUNTER — Inpatient Hospital Stay (HOSPITAL_COMMUNITY): Admission: RE | Admit: 2016-08-29 | Payer: Medicare Other | Source: Ambulatory Visit | Admitting: Internal Medicine

## 2016-09-03 DIAGNOSIS — M47817 Spondylosis without myelopathy or radiculopathy, lumbosacral region: Secondary | ICD-10-CM | POA: Diagnosis not present

## 2016-09-03 DIAGNOSIS — M5137 Other intervertebral disc degeneration, lumbosacral region: Secondary | ICD-10-CM | POA: Diagnosis not present

## 2016-09-03 DIAGNOSIS — Z79891 Long term (current) use of opiate analgesic: Secondary | ICD-10-CM | POA: Diagnosis not present

## 2016-09-03 DIAGNOSIS — G894 Chronic pain syndrome: Secondary | ICD-10-CM | POA: Diagnosis not present

## 2016-09-03 DIAGNOSIS — M533 Sacrococcygeal disorders, not elsewhere classified: Secondary | ICD-10-CM | POA: Diagnosis not present

## 2016-09-03 DIAGNOSIS — Z79899 Other long term (current) drug therapy: Secondary | ICD-10-CM | POA: Diagnosis not present

## 2016-09-05 ENCOUNTER — Emergency Department (HOSPITAL_COMMUNITY): Payer: Medicare Other

## 2016-09-05 ENCOUNTER — Encounter (HOSPITAL_COMMUNITY): Payer: Self-pay

## 2016-09-05 ENCOUNTER — Inpatient Hospital Stay (HOSPITAL_COMMUNITY)
Admission: EM | Admit: 2016-09-05 | Discharge: 2016-09-16 | DRG: 291 | Disposition: A | Discharge status: Home Health | Payer: Medicare Other | Attending: Internal Medicine | Admitting: Internal Medicine

## 2016-09-05 DIAGNOSIS — L989 Disorder of the skin and subcutaneous tissue, unspecified: Secondary | ICD-10-CM

## 2016-09-05 DIAGNOSIS — E785 Hyperlipidemia, unspecified: Secondary | ICD-10-CM | POA: Diagnosis present

## 2016-09-05 DIAGNOSIS — G8929 Other chronic pain: Secondary | ICD-10-CM | POA: Diagnosis present

## 2016-09-05 DIAGNOSIS — F17210 Nicotine dependence, cigarettes, uncomplicated: Secondary | ICD-10-CM | POA: Diagnosis present

## 2016-09-05 DIAGNOSIS — J9600 Acute respiratory failure, unspecified whether with hypoxia or hypercapnia: Secondary | ICD-10-CM

## 2016-09-05 DIAGNOSIS — I2510 Atherosclerotic heart disease of native coronary artery without angina pectoris: Secondary | ICD-10-CM | POA: Diagnosis not present

## 2016-09-05 DIAGNOSIS — J449 Chronic obstructive pulmonary disease, unspecified: Secondary | ICD-10-CM | POA: Diagnosis present

## 2016-09-05 DIAGNOSIS — L8990 Pressure ulcer of unspecified site, unspecified stage: Secondary | ICD-10-CM | POA: Insufficient documentation

## 2016-09-05 DIAGNOSIS — E118 Type 2 diabetes mellitus with unspecified complications: Secondary | ICD-10-CM | POA: Diagnosis not present

## 2016-09-05 DIAGNOSIS — J9622 Acute and chronic respiratory failure with hypercapnia: Secondary | ICD-10-CM | POA: Diagnosis not present

## 2016-09-05 DIAGNOSIS — I4892 Unspecified atrial flutter: Secondary | ICD-10-CM | POA: Diagnosis present

## 2016-09-05 DIAGNOSIS — E876 Hypokalemia: Secondary | ICD-10-CM | POA: Diagnosis present

## 2016-09-05 DIAGNOSIS — I272 Pulmonary hypertension, unspecified: Secondary | ICD-10-CM | POA: Diagnosis present

## 2016-09-05 DIAGNOSIS — I10 Essential (primary) hypertension: Secondary | ICD-10-CM | POA: Diagnosis not present

## 2016-09-05 DIAGNOSIS — E1142 Type 2 diabetes mellitus with diabetic polyneuropathy: Secondary | ICD-10-CM | POA: Diagnosis present

## 2016-09-05 DIAGNOSIS — J9690 Respiratory failure, unspecified, unspecified whether with hypoxia or hypercapnia: Secondary | ICD-10-CM | POA: Diagnosis not present

## 2016-09-05 DIAGNOSIS — Z978 Presence of other specified devices: Secondary | ICD-10-CM

## 2016-09-05 DIAGNOSIS — R069 Unspecified abnormalities of breathing: Secondary | ICD-10-CM

## 2016-09-05 DIAGNOSIS — E1169 Type 2 diabetes mellitus with other specified complication: Secondary | ICD-10-CM | POA: Diagnosis present

## 2016-09-05 DIAGNOSIS — D649 Anemia, unspecified: Secondary | ICD-10-CM

## 2016-09-05 DIAGNOSIS — I429 Cardiomyopathy, unspecified: Secondary | ICD-10-CM | POA: Diagnosis present

## 2016-09-05 DIAGNOSIS — J441 Chronic obstructive pulmonary disease with (acute) exacerbation: Secondary | ICD-10-CM | POA: Diagnosis not present

## 2016-09-05 DIAGNOSIS — IMO0002 Reserved for concepts with insufficient information to code with codable children: Secondary | ICD-10-CM | POA: Diagnosis present

## 2016-09-05 DIAGNOSIS — G9340 Encephalopathy, unspecified: Secondary | ICD-10-CM | POA: Diagnosis not present

## 2016-09-05 DIAGNOSIS — L8996 Pressure-induced deep tissue damage of unspecified site: Secondary | ICD-10-CM | POA: Insufficient documentation

## 2016-09-05 DIAGNOSIS — Z79899 Other long term (current) drug therapy: Secondary | ICD-10-CM

## 2016-09-05 DIAGNOSIS — I110 Hypertensive heart disease with heart failure: Secondary | ICD-10-CM | POA: Diagnosis present

## 2016-09-05 DIAGNOSIS — Z6838 Body mass index (BMI) 38.0-38.9, adult: Secondary | ICD-10-CM

## 2016-09-05 DIAGNOSIS — I5023 Acute on chronic systolic (congestive) heart failure: Secondary | ICD-10-CM | POA: Diagnosis not present

## 2016-09-05 DIAGNOSIS — F419 Anxiety disorder, unspecified: Secondary | ICD-10-CM | POA: Diagnosis present

## 2016-09-05 DIAGNOSIS — I509 Heart failure, unspecified: Secondary | ICD-10-CM

## 2016-09-05 DIAGNOSIS — Z4682 Encounter for fitting and adjustment of non-vascular catheter: Secondary | ICD-10-CM | POA: Diagnosis not present

## 2016-09-05 DIAGNOSIS — G9349 Other encephalopathy: Secondary | ICD-10-CM | POA: Diagnosis present

## 2016-09-05 DIAGNOSIS — Z9081 Acquired absence of spleen: Secondary | ICD-10-CM

## 2016-09-05 DIAGNOSIS — J9601 Acute respiratory failure with hypoxia: Secondary | ICD-10-CM

## 2016-09-05 DIAGNOSIS — E1165 Type 2 diabetes mellitus with hyperglycemia: Secondary | ICD-10-CM | POA: Diagnosis not present

## 2016-09-05 DIAGNOSIS — Z9981 Dependence on supplemental oxygen: Secondary | ICD-10-CM

## 2016-09-05 DIAGNOSIS — R5383 Other fatigue: Secondary | ICD-10-CM

## 2016-09-05 DIAGNOSIS — G4733 Obstructive sleep apnea (adult) (pediatric): Secondary | ICD-10-CM | POA: Diagnosis present

## 2016-09-05 DIAGNOSIS — E7800 Pure hypercholesterolemia, unspecified: Secondary | ICD-10-CM | POA: Diagnosis present

## 2016-09-05 DIAGNOSIS — I5043 Acute on chronic combined systolic (congestive) and diastolic (congestive) heart failure: Secondary | ICD-10-CM | POA: Diagnosis present

## 2016-09-05 DIAGNOSIS — I480 Paroxysmal atrial fibrillation: Secondary | ICD-10-CM | POA: Diagnosis present

## 2016-09-05 DIAGNOSIS — J9621 Acute and chronic respiratory failure with hypoxia: Secondary | ICD-10-CM | POA: Diagnosis present

## 2016-09-05 DIAGNOSIS — Z794 Long term (current) use of insulin: Secondary | ICD-10-CM

## 2016-09-05 DIAGNOSIS — Z8249 Family history of ischemic heart disease and other diseases of the circulatory system: Secondary | ICD-10-CM

## 2016-09-05 DIAGNOSIS — R000 Tachycardia, unspecified: Secondary | ICD-10-CM | POA: Diagnosis not present

## 2016-09-05 DIAGNOSIS — Z7901 Long term (current) use of anticoagulants: Secondary | ICD-10-CM

## 2016-09-05 DIAGNOSIS — Z4659 Encounter for fitting and adjustment of other gastrointestinal appliance and device: Secondary | ICD-10-CM

## 2016-09-05 DIAGNOSIS — R0989 Other specified symptoms and signs involving the circulatory and respiratory systems: Secondary | ICD-10-CM

## 2016-09-05 LAB — DIFFERENTIAL
BASOS PCT: 0 %
Basophils Absolute: 0 10*3/uL (ref 0.0–0.1)
EOS ABS: 0 10*3/uL (ref 0.0–0.7)
EOS PCT: 0 %
LYMPHS ABS: 2.1 10*3/uL (ref 0.7–4.0)
Lymphocytes Relative: 24 %
MONO ABS: 0.7 10*3/uL (ref 0.1–1.0)
MONOS PCT: 8 %
Neutro Abs: 6 10*3/uL (ref 1.7–7.7)
Neutrophils Relative %: 68 %

## 2016-09-05 LAB — BASIC METABOLIC PANEL
Anion gap: 6 (ref 5–15)
BUN: 12 mg/dL (ref 6–20)
CO2: 30 mmol/L (ref 22–32)
CREATININE: 0.81 mg/dL (ref 0.61–1.24)
Calcium: 8.5 mg/dL — ABNORMAL LOW (ref 8.9–10.3)
Chloride: 102 mmol/L (ref 101–111)
Glucose, Bld: 129 mg/dL — ABNORMAL HIGH (ref 65–99)
Potassium: 4.2 mmol/L (ref 3.5–5.1)
SODIUM: 138 mmol/L (ref 135–145)

## 2016-09-05 LAB — CBC
HCT: 37.6 % — ABNORMAL LOW (ref 39.0–52.0)
Hemoglobin: 11.5 g/dL — ABNORMAL LOW (ref 13.0–17.0)
MCH: 26.9 pg (ref 26.0–34.0)
MCHC: 30.6 g/dL (ref 30.0–36.0)
MCV: 88.1 fL (ref 78.0–100.0)
PLATELETS: 428 10*3/uL — AB (ref 150–400)
RBC: 4.27 MIL/uL (ref 4.22–5.81)
RDW: 17.5 % — AB (ref 11.5–15.5)
WBC: 8.7 10*3/uL (ref 4.0–10.5)

## 2016-09-05 LAB — PROTIME-INR
INR: 1.14
PROTHROMBIN TIME: 14.7 s (ref 11.4–15.2)

## 2016-09-05 LAB — I-STAT TROPONIN, ED: TROPONIN I, POC: 0.05 ng/mL (ref 0.00–0.08)

## 2016-09-05 LAB — BRAIN NATRIURETIC PEPTIDE: B Natriuretic Peptide: 531 pg/mL — ABNORMAL HIGH (ref 0.0–100.0)

## 2016-09-05 LAB — GLUCOSE, CAPILLARY: Glucose-Capillary: 87 mg/dL (ref 65–99)

## 2016-09-05 MED ORDER — ONDANSETRON HCL 4 MG/2ML IJ SOLN
4.0000 mg | Freq: Four times a day (QID) | INTRAMUSCULAR | Status: DC | PRN
  Administered 2016-09-09: 4 mg via INTRAVENOUS
  Filled 2016-09-05: qty 2

## 2016-09-05 MED ORDER — FLUTICASONE PROPIONATE 50 MCG/ACT NA SUSP
2.0000 | Freq: Every day | NASAL | Status: DC | PRN
  Filled 2016-09-05: qty 16

## 2016-09-05 MED ORDER — GABAPENTIN 400 MG PO CAPS
1800.0000 mg | ORAL_CAPSULE | Freq: Every day | ORAL | Status: DC
  Administered 2016-09-05 – 2016-09-15 (×11): 1800 mg via ORAL
  Filled 2016-09-05 (×11): qty 2

## 2016-09-05 MED ORDER — DIGOXIN 125 MCG PO TABS
0.1250 mg | ORAL_TABLET | Freq: Every day | ORAL | Status: DC
  Administered 2016-09-07 – 2016-09-16 (×10): 0.125 mg via ORAL
  Filled 2016-09-05 (×11): qty 1

## 2016-09-05 MED ORDER — ISOSORBIDE MONONITRATE ER 60 MG PO TB24
30.0000 mg | ORAL_TABLET | Freq: Every day | ORAL | Status: DC
  Administered 2016-09-05: 30 mg via ORAL
  Filled 2016-09-05: qty 1

## 2016-09-05 MED ORDER — INSULIN ASPART 100 UNIT/ML ~~LOC~~ SOLN
0.0000 [IU] | Freq: Three times a day (TID) | SUBCUTANEOUS | Status: DC
  Administered 2016-09-06: 2 [IU] via SUBCUTANEOUS
  Administered 2016-09-08: 1 [IU] via SUBCUTANEOUS

## 2016-09-05 MED ORDER — HYDROCORTISONE 1 % EX LOTN
1.0000 "application " | TOPICAL_LOTION | Freq: Two times a day (BID) | CUTANEOUS | Status: DC
  Administered 2016-09-05: 1 via TOPICAL
  Filled 2016-09-05 (×3): qty 118

## 2016-09-05 MED ORDER — RIVAROXABAN 20 MG PO TABS
20.0000 mg | ORAL_TABLET | Freq: Every day | ORAL | Status: DC
  Administered 2016-09-05: 20 mg via ORAL
  Filled 2016-09-05: qty 1

## 2016-09-05 MED ORDER — INSULIN DETEMIR 100 UNIT/ML ~~LOC~~ SOLN
20.0000 [IU] | Freq: Every evening | SUBCUTANEOUS | Status: DC
  Administered 2016-09-05 – 2016-09-07 (×3): 20 [IU] via SUBCUTANEOUS
  Filled 2016-09-05 (×4): qty 0.2

## 2016-09-05 MED ORDER — SODIUM CHLORIDE 0.9% FLUSH
3.0000 mL | Freq: Two times a day (BID) | INTRAVENOUS | Status: DC
  Administered 2016-09-05 – 2016-09-12 (×15): 3 mL via INTRAVENOUS

## 2016-09-05 MED ORDER — LEVALBUTEROL HCL 0.63 MG/3ML IN NEBU: 0.6300 mg | INHALATION_SOLUTION | Freq: Four times a day (QID) | RESPIRATORY_TRACT | Status: DC | PRN

## 2016-09-05 MED ORDER — METHADONE HCL 5 MG PO TABS
10.0000 mg | ORAL_TABLET | Freq: Two times a day (BID) | ORAL | Status: DC
  Filled 2016-09-05: qty 2

## 2016-09-05 MED ORDER — SODIUM CHLORIDE 0.9 % IV SOLN: 250.0000 mL | INTRAVENOUS | Status: DC | PRN

## 2016-09-05 MED ORDER — DICLOFENAC SODIUM 1 % TD GEL
2.0000 g | Freq: Two times a day (BID) | TRANSDERMAL | Status: DC | PRN
  Filled 2016-09-05: qty 100

## 2016-09-05 MED ORDER — IPRATROPIUM BROMIDE 0.02 % IN SOLN
0.5000 mg | RESPIRATORY_TRACT | Status: DC
  Administered 2016-09-05: 0.5 mg via RESPIRATORY_TRACT
  Filled 2016-09-05: qty 2.5

## 2016-09-05 MED ORDER — HYDROCORTISONE 2.5 % RE CREA
1.0000 "application " | TOPICAL_CREAM | Freq: Two times a day (BID) | RECTAL | Status: DC | PRN
  Filled 2016-09-05: qty 28.35

## 2016-09-05 MED ORDER — MOMETASONE FURO-FORMOTEROL FUM 200-5 MCG/ACT IN AERO
2.0000 | INHALATION_SPRAY | Freq: Two times a day (BID) | RESPIRATORY_TRACT | Status: DC
Start: 2016-09-05 — End: 2016-09-07
  Administered 2016-09-06: 2 via RESPIRATORY_TRACT
  Filled 2016-09-05: qty 8.8

## 2016-09-05 MED ORDER — METHADONE HCL 10 MG PO TABS
10.0000 mg | ORAL_TABLET | Freq: Two times a day (BID) | ORAL | Status: DC
  Administered 2016-09-05 – 2016-09-06 (×2): 10 mg via ORAL
  Filled 2016-09-05: qty 1

## 2016-09-05 MED ORDER — FUROSEMIDE 10 MG/ML IJ SOLN
80.0000 mg | Freq: Once | INTRAMUSCULAR | Status: AC
  Administered 2016-09-05: 80 mg via INTRAVENOUS
  Filled 2016-09-05: qty 8

## 2016-09-05 MED ORDER — GABAPENTIN 300 MG PO CAPS
600.0000 mg | ORAL_CAPSULE | Freq: Every day | ORAL | Status: DC
  Administered 2016-09-07 – 2016-09-16 (×10): 600 mg via ORAL
  Filled 2016-09-05 (×10): qty 2

## 2016-09-05 MED ORDER — FUROSEMIDE 10 MG/ML IJ SOLN
60.0000 mg | Freq: Three times a day (TID) | INTRAMUSCULAR | Status: DC
  Administered 2016-09-06 – 2016-09-09 (×10): 60 mg via INTRAVENOUS
  Filled 2016-09-05 (×10): qty 6

## 2016-09-05 MED ORDER — CARVEDILOL 12.5 MG PO TABS
12.5000 mg | ORAL_TABLET | Freq: Two times a day (BID) | ORAL | Status: DC
  Administered 2016-09-05 – 2016-09-07 (×3): 12.5 mg via ORAL
  Filled 2016-09-05 (×3): qty 1

## 2016-09-05 MED ORDER — ACETAMINOPHEN 325 MG PO TABS: 650.0000 mg | ORAL_TABLET | ORAL | Status: DC | PRN

## 2016-09-05 MED ORDER — ROSUVASTATIN CALCIUM 20 MG PO TABS
20.0000 mg | ORAL_TABLET | Freq: Every day | ORAL | Status: DC
  Filled 2016-09-05: qty 1

## 2016-09-05 MED ORDER — SODIUM CHLORIDE 0.9% FLUSH
3.0000 mL | INTRAVENOUS | Status: DC | PRN
Start: 2016-09-05 — End: 2016-09-16

## 2016-09-05 MED ORDER — SACUBITRIL-VALSARTAN 49-51 MG PO TABS
1.0000 | ORAL_TABLET | Freq: Two times a day (BID) | ORAL | Status: DC
  Administered 2016-09-05 – 2016-09-16 (×21): 1 via ORAL
  Filled 2016-09-05 (×23): qty 1

## 2016-09-05 MED ORDER — DIAZEPAM 2 MG PO TABS: 2.0000 mg | ORAL_TABLET | Freq: Two times a day (BID) | ORAL | Status: DC | PRN

## 2016-09-05 MED ORDER — HYDRALAZINE HCL 25 MG PO TABS
25.0000 mg | ORAL_TABLET | Freq: Three times a day (TID) | ORAL | Status: DC
  Administered 2016-09-05 – 2016-09-06 (×2): 25 mg via ORAL
  Filled 2016-09-05 (×2): qty 1

## 2016-09-05 MED ORDER — GABAPENTIN 600 MG PO TABS: 600.0000 mg | ORAL_TABLET | ORAL | Status: DC

## 2016-09-05 NOTE — ED Notes (Signed)
Patient transported to X-ray 

## 2016-09-05 NOTE — ED Notes (Signed)
ED Provider at bedside. 

## 2016-09-05 NOTE — ED Notes (Signed)
Pt on 3L of O2 at home

## 2016-09-05 NOTE — Progress Notes (Signed)
EDCM spoke to patient and his wife at bedside.  Patient lives at home with his wife.  Patient's wife reports patient has home health services in the past with ? [facility].  She reports the patient has a walker at home.  She reports the patient wears oxygen at home, but cannot remember who delivers his oxygen.  She thinks it may be [facility].  EDCM informed patient's wife if the patient would like portable oxygen tank, to call the agency who delivers his oxygen.  Patient's wife is also interested in bedside commode for patient.  EDCM provided patient's wife with list of home health agencies in [facility], explained services.  Patient's wife thankful for resources.  No further EDCM needs at this time.

## 2016-09-05 NOTE — ED Provider Notes (Addendum)
[facility] DEPT Provider Note   CSN: [id number] Arrival date & time: 09/05/16  1832     History   Chief Complaint Chief Complaint  Patient presents with  . Chest Pain  . Shortness of Breath    HPI Nicholas Caldwell is a 70 y.o. male.  He comes in stating his heart failure is getting worse. He knows this because he started feeling dizzy last night and having some vague chest discomfort. He has chronic dyspnea which he thinks started getting a little worse last night. He claims compliance with all of his medications. He does have history of chronic systolic congestive heart, nonischemic cardiomyopathy, diabetes, atrial fibrillation and atrial flutter, hypertension. He states that his weight went up 2 pounds from yesterday but he doesn't think it is gone up much over the last week. His wife states that his breathing actually started getting worse several days ago and she has noted significant worsening of his leg edema. He also has ulcers on his left heel and left lower leg which she is concerned about.   The history is provided by the patient and the spouse.    Past Medical History:  Diagnosis Date  . Atrial flutter ([facility])    a. recurrent AFlutter with RVR;  b. Amiodarone Rx started 4/16  . CAD (coronary artery disease)    a. LHC 1/16:  mLAD diffuse disease, pLCx mild disease, dLCx with disease but too small for PCI, RCA ok, EF 25-30%  . Chronic pain   . Chronic systolic CHF (congestive heart failure) ([place])   . COPD (chronic obstructive pulmonary disease) ([place])   . Diabetes mellitus without complication ([place])   . Hypercholesteremia   . Hypertension   . NICM (nonischemic cardiomyopathy) ([place])    a.dx 2016. b. 2D echo 06/2016 - Last echo 07/01/16: mod dilated LV, mod LVH, EF 25-30%, mild-mod MR, sev LAE, mild-mod reduced RV systolic function, mild-mod TR, PASP 6mHG.  .Marland KitchenPAF (paroxysmal atrial fibrillation) (HCC)    On amio - ot a candidate for flecainide due to cardiomyopathy, not a  candidate for Tikosyn due to prolonged QT, and felt to be a poor candidate for ablation given left atrial size.  . Prolonged Q-T interval on ECG   . Pulmonary hypertension   . Tobacco abuse     Patient Active Problem List   Diagnosis Date Noted  . Acute exacerbation of CHF (congestive heart failure) ([facility] 07/28/2016  . Chronic systolic CHF (congestive heart failure) ([facility] 07/16/2016  . Paroxysmal atrial flutter ([facility] 07/16/2016  . COPD (chronic obstructive pulmonary disease) ([facility] 07/16/2016  . Uncontrolled type 2 diabetes mellitus with complication ([facility]   . Diabetic polyneuropathy associated with diabetes mellitus due to underlying condition ([facility]   . Normocytic anemia 06/29/2016  . CAD - Non-obstructive by LHC1/16 01/21/2016  . Prolonged QT interval 10/09/2015  . Nonischemic cardiomyopathy ([facility] 10/09/2015  . PAF (paroxysmal atrial fibrillation) ([facility]   . Acute respiratory failure with hypoxia and hypercarbia ([facility] 02/19/2015  . Chronic pain 02/19/2015  . Morbid obesity ([facility] 02/13/2015  . Acute on chronic systolic CHF 153/97/6734 . Elevated troponin I level 11/01/2014  . COPD exacerbation ([facility] 11/01/2014  . Essential hypertension 10/31/2014  . Type 2 diabetes mellitus with neuropathy 10/31/2014  . Hyperlipidemia  10/31/2014  . Smoker 10/31/2014    Past Surgical History:  Procedure Laterality Date  . LEFT AND RIGHT HEART CATHETERIZATION WITH CORONARY ANGIOGRAM N/A 11/06/2014   Procedure: LEFT AND RIGHT HEART CATHETERIZATION WITH CORONARY ANGIOGRAM;  Surgeon: Jettie Booze, MD;  Location: [facility] CATH LAB;  Service: Cardiovascular;  Laterality: N/A;  . SPLENECTOMY         Home Medications    Prior to Admission medications   Medication Sig Start Date End Date Taking? Authorizing Provider  albuterol (PROVENTIL HFA;VENTOLIN HFA) 108 (90 BASE) MCG/ACT inhaler Inhale 2 puffs into the lungs every 6 (six) hours as needed for wheezing or shortness of breath. 11/07/14   Kinnie Feil, MD  carvedilol (COREG) 25 MG tablet Take 25 mg by mouth 2 (two) times daily with a meal.    Historical Provider, MD  diazepam (VALIUM) 10 MG tablet Take 0.5 tablets (5 mg total) by mouth daily as needed for anxiety or sleep. 07/04/16   Belkys A Regalado, MD  diclofenac sodium (VOLTAREN) 1 % GEL Apply 2 g topically 2 (two) times daily as needed (for pain).    Historical Provider, MD  digoxin (LANOXIN) 0.125 MG tablet Take 1 tablet (0.125 mg total) by mouth daily. 09/12/15   Liliane Shi, PA-C  fluticasone (FLONASE) 50 MCG/ACT nasal spray Place 2 sprays into both nostrils daily.    Historical Provider, MD  furosemide (LASIX) 40 MG tablet Take 2 tablets (80 mg total) by mouth 3 (three) times daily. 08/04/16   Hosie Poisson, MD  gabapentin (NEURONTIN) 600 MG tablet Take 1-3 tablets (600-1,800 mg total) by mouth See admin instructions. Take 1 tablet (600 mg) by mouth daily with lunch,3 tablets (1800 mg) at bedtime 07/04/16   Belkys A Regalado, MD  glipiZIDE (GLUCOTROL) 10 MG tablet Take 10 mg by mouth 2 (two) times daily before a meal.     Historical Provider, MD  glucose 4 GM chewable tablet Chew 4 tablets by mouth See admin instructions. Chew 4 tablets by mouth as needed for low blood sugar - repeat every 15 minutes if blood sugar less than 70    Historical Provider, MD  hydrALAZINE (APRESOLINE) 25 MG tablet Take 1 tablet (25 mg total) by mouth 3 (three) times daily. 09/12/15   Liliane Shi, PA-C  hydrocortisone (ANUSOL-HC) 2.5 % rectal cream Place 1 application rectally 2 (two) times daily as needed for hemorrhoids or itching. Use up to 2 weeks at a time as needed    Historical Provider, MD  hydrocortisone 1 % lotion Apply 1 application topically See admin instructions. Apply small amount to back twice daily for itchy rash    Historical Provider, MD  insulin aspart (NOVOLOG) 100 UNIT/ML injection CBG 70 - 120: 0 units CBG 121 - 150: 2 units CBG 151 - 200: 3 units CBG 201 - 250: 5 units CBG 251 -  300: 8 units CBG 301 - 350: 11 units CBG 351 - 400: 15 units 08/04/16   Hosie Poisson, MD  insulin detemir (LEVEMIR) 100 UNIT/ML injection Inject 0.3 mLs (30 Units total) into the skin every evening. 08/04/16   Hosie Poisson, MD  ipratropium (ATROVENT) 0.02 % nebulizer solution Take 2.5 mLs (0.5 mg total) by nebulization every 6 (six) hours as needed for wheezing or shortness of breath. 08/04/16   Hosie Poisson, MD  isosorbide mononitrate (IMDUR) 30 MG 24 hr tablet Take 1 tablet (30 mg total) by mouth daily. 08/18/16   Liliane Shi, PA-C  levalbuterol (XOPENEX) 0.63 MG/3ML nebulizer solution Take 3 mLs (0.63 mg total) by nebulization every 8 (eight) hours as needed for wheezing or shortness of breath. 08/04/16   Hosie Poisson, MD  metFORMIN (GLUCOPHAGE-XR) 500 MG 24 hr  tablet Take 1,000 mg by mouth 2 (two) times daily with a meal.    Historical Provider, MD  methadone (DOLOPHINE) 10 MG tablet Take 1 tablet (10 mg total) by mouth every 12 (twelve) hours. 08/04/16   Hosie Poisson, MD  metolazone (ZAROXOLYN) 2.5 MG tablet Take 1 tablet (2.5 mg total) by mouth as directed. 1 tablet every Tuesday 30 minutes before morning lasix 08/18/16 11/16/16  Scott T Weaver, PA-C  mometasone-formoterol (DULERA) 200-5 MCG/ACT AERO Inhale 2 puffs into the lungs 2 (two) times daily.    Historical Provider, MD  potassium chloride SA (K-DUR,KLOR-CON) 20 MEQ tablet Take 2 tablets (40 mEq total) by mouth daily. 11/07/14   Kinnie Feil, MD  rivaroxaban (XARELTO) 20 MG TABS tablet Take 1 tablet (20 mg total) by mouth daily with supper. 02/23/15   Luan Moore, MD  rosuvastatin (CRESTOR) 40 MG tablet Take 20 mg by mouth daily. For cholesterol    Historical Provider, MD  sacubitril-valsartan (ENTRESTO) 49-51 MG Take 1 tablet by mouth 2 (two) times daily. 09/12/15   Liliane Shi, PA-C  spironolactone (ALDACTONE) 25 MG tablet Take 1 tablet (25 mg total) by mouth daily. 08/02/15   Liliane Shi, PA-C    Family History Family History    Problem Relation Age of Onset  . Heart disease Mother   . Hypertension Mother   . Heart failure Mother   . Heart disease Father   . Hypertension Sister   . Heart attack Neg Hx   . Stroke Neg Hx     Social History Social History  Substance Use Topics  . Smoking status: Current Every Day Smoker    Packs/day: 0.05    Types: Cigarettes    Last attempt to quit: 08/30/2015  . Smokeless tobacco: Never Used  . Alcohol use No     Allergies   Review of patient's allergies indicates no known allergies.   Review of Systems Review of Systems  All other systems reviewed and are negative.    Physical Exam Updated Vital Signs BP 135/85 (BP Location: Right Arm)   Pulse (!) 131   Resp 20   SpO2 92%   Physical Exam  Nursing note and vitals reviewed.  70 year old male, resting comfortably and in no acute distress. Vital signs are Significant for tachycardia. Oxygen saturation is 92%, which is normal. Head is normocephalic and atraumatic. PERRLA, EOMI. Oropharynx is clear. Neck is nontender and supple without adenopathy or JVD. Back is nontender and there is no CVA tenderness. Lungs have mild diffuse wheezing with some bibasilar rales. There are no rhonchi. Chest is nontender. Heart is tachycardic without murmur. Abdomen is soft, mildly distended, nontender without masses or hepatosplenomegaly and peristalsis is normoactive. Extremities have 3+ pretibial and pedal edema, there is also 1+ presacral edema full range of motion is present. Venous stasis ulcer is present in the left lower leg posterior laterally. This appears dry without evidence of infection. There is a stage I decubitus ulcer on the left heel. Skin is warm and dry. Neurologic: Mental status is normal, cranial nerves are intact, there are no motor or sensory deficits.  ED Treatments / Results  Labs (all labs ordered are listed, but only abnormal results are displayed) Labs Reviewed  BASIC METABOLIC PANEL -  Abnormal; Notable for the following:       Result Value   Glucose, Bld 129 (*)    Calcium 8.5 (*)    All other components within normal limits  CBC -  Abnormal; Notable for the following:    Hemoglobin 11.5 (*)    HCT 37.6 (*)    RDW 17.5 (*)    Platelets 428 (*)    All other components within normal limits  BRAIN NATRIURETIC PEPTIDE - Abnormal; Notable for the following:    B Natriuretic Peptide 531.0 (*)    All other components within normal limits  PROTIME-INR  DIFFERENTIAL  I-STAT TROPOININ, ED    EKG  EKG Interpretation  Date/Time:  Friday September 05 2016 18:45:00 EDT Ventricular Rate:  131 PR Interval:    QRS Duration: 120 QT Interval:  326 QTC Calculation: 482 R Axis:   87 Text Interpretation:  Atrial flutter with 2 to 1 block Nonspecific intraventricular conduction delay Borderline repolarization abnormality Baseline wander in lead(s) V1 When compared with ECG of 07/27/2016, Atrial flutter with 2 to 1 block has replaced Atrial fibrillation with rapid ventricular response Confirmed by Roxanne Mins  MD, Cameren Odwyer ([id number]) on 09/05/2016 7:20:15 PM       Radiology Dg Chest 2 View  Result Date: 09/05/2016 CLINICAL DATA:  CHF, atrial flutter, coronary disease, COPD, non ischemic cardiomyopathy, diabetes mellitus, hypertension, paroxysmal atrial fibrillation, smoker EXAM: CHEST  2 VIEW COMPARISON:  07/27/2016 FINDINGS: Enlargement of cardiac silhouette with pulmonary vascular congestion. Interstitial infiltrates likely interstitial pulmonary edema and CHF. RIGHT basilar pleural effusion atelectasis. No pneumothorax. Bones unremarkable. IMPRESSION: CHF with RIGHT pleural effusion. Electronically Signed   By: Lavonia Dana M.D.   On: 09/05/2016 19:22    Procedures Procedures (including critical care time)  Medications Ordered in ED Medications  furosemide (LASIX) injection 80 mg (not administered)     Initial Impression / Assessment and Plan / ED Course  I have reviewed the triage  vital signs and the nursing notes.  Pertinent labs & imaging results that were available during my care of the patient were reviewed by me and considered in my medical decision making (see chart for details).  Clinical Course   CHF exacerbation. ECG shows atrial flutter which she does have a history of. Old records are reviewed, and he has several hospitalizations for acute on chronic heart failure. Catheterization in January 2016 showed nonischemic cardiomyopathy. ED workup is initiated and is given a dose of furosemide. I discussed management of the heel decubitus ulcer with his wife. CHADS-VASC Score = 4.  Chest x-ray shows evidence of early pulmonary edema. BNP is mildly elevated over baseline. He has mild anemia which is unchanged from baseline. He will need to be admitted for diuresis. Case is discussed with Dr. Blaine Hamper of [facility] agrees to admit the patient.  Final Clinical Impressions(s) / ED Diagnoses   Final diagnoses:  Acute on chronic systolic heart failure (HCC)  Normochromic normocytic anemia  Atrial flutter, unspecified type [facility])    New Prescriptions New Prescriptions   No medications on file     Delora Fuel, MD 02/58/52 [id number]    Delora Fuel, MD 42/35/36 1443

## 2016-09-05 NOTE — H&P (Addendum)
History and Physical    Nicholas Caldwell XUX:[id number] DOB: [birth year] DOA: 09/05/2016  Referring MD/NP/PA:   PCP: [facility]   Patient coming from:  The patient is coming from home.  At baseline, pt is independent for most of ADL.   Chief Complaint: Shortness of breath and leg edema  HPI: Nicholas Caldwell is a 70 y.o. male with medical history significant of sCHF with EF of 25-30%, COPD, chronic respiratory failure on 3LO2at home, hypertension, hyperlipidemia, diabetes mellitus, tobacco abuse, QT prolongation, PAF on Xarelto, pulmonary hypertension, CAD, chronic pain syndrome, s/p of splenectomy, who presents with shortness of breath and leg edema.  Patient states that he started having worsening shortness of breath since yesterday. His leg edema has been getting worse recently. He has dry cough, but no fever or chills. Per ED physician, patient had mild chest pain earlier, but currently he denies any chest pain. Patient can speak in full sentences currently. Patient denies nausea, vomiting, abdominal pain, diarrhea, symptoms of UTI, unilateral weakness. He has skin lesion over left lateral leg, and left heel. Per patient's wife, patient always sleeps a lot everyday, he is drowsy all the time at baseline.  ED Course: pt was found to have BNP 531, WBC 8.7, INR 1.14, negative troponin, renal function okay, tachycardia, O2 saturation 92-95 percent on room air, chest x-ray is consistent with CHF and also has right pleural effusion. Patient is admitted to telemetry bed as inpatient.  Review of Systems:   General: no fevers, chills, no changes in body weight, has poor appetite, has fatigue HEENT: no blurry vision, hearing changes or sore throat Respiratory: has dyspnea, coughing, wheezing CV: no chest pain, no palpitations GI: no nausea, vomiting, abdominal pain, diarrhea, constipation GU: no dysuria, burning on urination, increased urinary frequency, hematuria  Ext: has leg edema Neuro:  no unilateral weakness, numbness, or tingling, no vision change or hearing loss Skin: He has skin lesion over left lateral leg, and left heel. MSK: No muscle spasm, no deformity, no limitation of range of movement in spin Heme: No easy bruising.  Travel history: No recent long distant travel.  Allergy: No Known Allergies  Past Medical History:  Diagnosis Date  . Atrial flutter ([place])    a. recurrent AFlutter with RVR;  b. Amiodarone Rx started 4/16  . CAD (coronary artery disease)    a. LHC 1/16:  mLAD diffuse disease, pLCx mild disease, dLCx with disease but too small for PCI, RCA ok, EF 25-30%  . Chronic pain   . Chronic systolic CHF (congestive heart failure) ([place])   . COPD (chronic obstructive pulmonary disease) ([place])   . Diabetes mellitus without complication ([place])   . Hypercholesteremia   . Hypertension   . NICM (nonischemic cardiomyopathy) ([place])    a.dx 2016. b. 2D echo 06/2016 - Last echo 07/01/16: mod dilated LV, mod LVH, EF 25-30%, mild-mod MR, sev LAE, mild-mod reduced RV systolic function, mild-mod TR, PASP 88mHG.  .Marland KitchenPAF (paroxysmal atrial fibrillation) (HCC)    On amio - ot a candidate for flecainide due to cardiomyopathy, not a candidate for Tikosyn due to prolonged QT, and felt to be a poor candidate for ablation given left atrial size.  . Prolonged Q-T interval on ECG   . Pulmonary hypertension   . Tobacco abuse     Past Surgical History:  Procedure Laterality Date  . LEFT AND RIGHT HEART CATHETERIZATION WITH CORONARY ANGIOGRAM N/A 11/06/2014   Procedure: LEFT AND RIGHT HEART CATHETERIZATION WITH CORONARY ANGIOGRAM;  Surgeon: Jettie Booze, MD;  Location: [facility] CATH LAB;  Service: Cardiovascular;  Laterality: N/A;  . SPLENECTOMY      Social History:  reports that he has been smoking Cigarettes.  He has been smoking about 0.05 packs per day. He has never used smokeless tobacco. He reports that he does not drink alcohol or use drugs.  Family History:  Family  History  Problem Relation Age of Onset  . Heart disease Mother   . Hypertension Mother   . Heart failure Mother   . Heart disease Father   . Hypertension Sister   . Heart attack Neg Hx   . Stroke Neg Hx      Prior to Admission medications   Medication Sig Start Date End Date Taking? Authorizing Provider  albuterol (PROVENTIL HFA;VENTOLIN HFA) 108 (90 BASE) MCG/ACT inhaler Inhale 2 puffs into the lungs every 6 (six) hours as needed for wheezing or shortness of breath. 11/07/14  Yes Kinnie Feil, MD  carvedilol (COREG) 25 MG tablet Take 25 mg by mouth 2 (two) times daily with a meal.   Yes Historical Provider, MD  diazepam (VALIUM) 10 MG tablet Take 0.5 tablets (5 mg total) by mouth daily as needed for anxiety or sleep. 07/04/16  Yes Belkys A Regalado, MD  diclofenac sodium (VOLTAREN) 1 % GEL Apply 2 g topically 2 (two) times daily as needed (for pain).   Yes Historical Provider, MD  digoxin (LANOXIN) 0.125 MG tablet Take 1 tablet (0.125 mg total) by mouth daily. 09/12/15  Yes Scott T Kathlen Mody, PA-C  fluticasone (FLONASE) 50 MCG/ACT nasal spray Place 2 sprays into both nostrils daily as needed for allergies.    Yes Historical Provider, MD  furosemide (LASIX) 40 MG tablet Take 2 tablets (80 mg total) by mouth 3 (three) times daily. 08/04/16  Yes Hosie Poisson, MD  gabapentin (NEURONTIN) 600 MG tablet Take 1-3 tablets (600-1,800 mg total) by mouth See admin instructions. Take 1 tablet (600 mg) by mouth daily with lunch,3 tablets (1800 mg) at bedtime 07/04/16  Yes Belkys A Regalado, MD  glipiZIDE (GLUCOTROL) 10 MG tablet Take 10 mg by mouth 2 (two) times daily before a meal.    Yes Historical Provider, MD  glucose 4 GM chewable tablet Chew 4 tablets by mouth See admin instructions. Chew 4 tablets by mouth as needed for low blood sugar - repeat every 15 minutes if blood sugar less than 70   Yes Historical Provider, MD  hydrALAZINE (APRESOLINE) 25 MG tablet Take 1 tablet (25 mg total) by mouth 3 (three)  times daily. 09/12/15  Yes Scott Joylene Draft, PA-C  hydrocortisone (ANUSOL-HC) 2.5 % rectal cream Place 1 application rectally 2 (two) times daily as needed for hemorrhoids or itching. Use up to 2 weeks at a time as needed   Yes Historical Provider, MD  hydrocortisone 1 % lotion Apply 1 application topically See admin instructions. Apply small amount to back twice daily for itchy rash   Yes Historical Provider, MD  insulin aspart (NOVOLOG) 100 UNIT/ML injection CBG 70 - 120: 0 units CBG 121 - 150: 2 units CBG 151 - 200: 3 units CBG 201 - 250: 5 units CBG 251 - 300: 8 units CBG 301 - 350: 11 units CBG 351 - 400: 15 units Patient taking differently: Inject 0-15 Units into the skin 3 (three) times daily as needed for high blood sugar. CBG 70 - 120: 0 units; CBG 121 - 150: 2 units; CBG 151 - 200: 3 units;  CBG 201 - 250: 5 units; CBG 251 - 300: 8 units; CBG 301 - 350: 11 units; CBG 351 - 400: 15 units 08/04/16  Yes Hosie Poisson, MD  insulin detemir (LEVEMIR) 100 UNIT/ML injection Inject 0.3 mLs (30 Units total) into the skin every evening. Patient taking differently: Inject 44 Units into the skin every evening.  08/04/16  Yes Hosie Poisson, MD  ipratropium (ATROVENT) 0.02 % nebulizer solution Take 2.5 mLs (0.5 mg total) by nebulization every 6 (six) hours as needed for wheezing or shortness of breath. 08/04/16  Yes Hosie Poisson, MD  isosorbide mononitrate (IMDUR) 30 MG 24 hr tablet Take 1 tablet (30 mg total) by mouth daily. 08/18/16  Yes Scott T Kathlen Mody, PA-C  levalbuterol (XOPENEX) 0.63 MG/3ML nebulizer solution Take 3 mLs (0.63 mg total) by nebulization every 8 (eight) hours as needed for wheezing or shortness of breath. 08/04/16  Yes Hosie Poisson, MD  metFORMIN (GLUCOPHAGE-XR) 500 MG 24 hr tablet Take 1,000 mg by mouth 2 (two) times daily with a meal.   Yes Historical Provider, MD  methadone (DOLOPHINE) 10 MG tablet Take 1 tablet (10 mg total) by mouth every 12 (twelve) hours. 08/04/16  Yes Hosie Poisson, MD    metolazone (ZAROXOLYN) 2.5 MG tablet Take 1 tablet (2.5 mg total) by mouth as directed. 1 tablet every Tuesday 30 minutes before morning lasix Patient taking differently: Take 2.5 mg by mouth every Tuesday. 1 tablet every Tuesday 30 minutes before morning lasix 08/18/16 11/16/16 Yes Scott T Weaver, PA-C  mometasone-formoterol (DULERA) 200-5 MCG/ACT AERO Inhale 2 puffs into the lungs 2 (two) times daily.   Yes Historical Provider, MD  potassium chloride SA (K-DUR,KLOR-CON) 20 MEQ tablet Take 2 tablets (40 mEq total) by mouth daily. 11/07/14  Yes Kinnie Feil, MD  rivaroxaban (XARELTO) 20 MG TABS tablet Take 1 tablet (20 mg total) by mouth daily with supper. 02/23/15  Yes Luan Moore, MD  rosuvastatin (CRESTOR) 40 MG tablet Take 20 mg by mouth daily. For cholesterol   Yes Historical Provider, MD  sacubitril-valsartan (ENTRESTO) 49-51 MG Take 1 tablet by mouth 2 (two) times daily. 09/12/15  Yes Scott Joylene Draft, PA-C  spironolactone (ALDACTONE) 25 MG tablet Take 1 tablet (25 mg total) by mouth daily. 08/02/15  Yes Liliane Shi, PA-C    Physical Exam: Vitals:   09/05/16 2130 09/05/16 2148 09/05/16 2235 09/05/16 2308  BP: 133/78 133/78 (!) 144/87   Pulse:  (!) 130 (!) 126   Resp:  18 20   Temp:   98 F (36.7 C)   TempSrc:   Oral   SpO2:  95% 94% 93%   General: Not in acute distress HEENT:       Eyes: PERRL, EOMI, no scleral icterus.       ENT: No discharge from the ears and nose, no pharynx injection, no tonsillar enlargement.        Neck: Difficult to assess JVD due to obesity, no bruit, no mass felt. Heme: No neck lymph node enlargement. Cardiac: S1/S2, RRR, No murmurs, No gallops or rubs. Respiratory: has mild wheezing on the right side posteriorly. Has fine crackles on the left posteriorly. GI: Soft, nondistended, nontender, no rebound pain, no organomegaly, BS present. GU: No hematuria Ext: 3+ pitting leg edema bilaterally. 2+DP/PT pulse bilaterally. Musculoskeletal: No joint  deformities, No joint redness or warmth, no limitation of ROM in spin. Skin: He has skin lesion over left lateral leg, which is small, black in color and dry without signs  of infection. He also has skin tear in left heel without signs of infection. Neuro: Alert, oriented X3, cranial nerves II-XII grossly intact, moves all extremities normally.  Psych: Patient is not psychotic, no suicidal or hemocidal ideation.  Labs on Admission: I have personally reviewed following labs and imaging studies  CBC:  Recent Labs Lab 09/05/16 1936  WBC 8.7  NEUTROABS PENDING  HGB 11.5*  HCT 37.6*  MCV 88.1  PLT 295*   Basic Metabolic Panel:  Recent Labs Lab 09/05/16 1936  NA 138  K 4.2  CL 102  CO2 30  GLUCOSE 129*  BUN 12  CREATININE 0.81  CALCIUM 8.5*   GFR: CrCl cannot be calculated (Unknown ideal weight.). Liver Function Tests: No results for input(s): AST, ALT, ALKPHOS, BILITOT, PROT, ALBUMIN in the last 168 hours. No results for input(s): LIPASE, AMYLASE in the last 168 hours. No results for input(s): AMMONIA in the last 168 hours. Coagulation Profile:  Recent Labs Lab 09/05/16 1936  INR 1.14   Cardiac Enzymes: No results for input(s): CKTOTAL, CKMB, CKMBINDEX, TROPONINI in the last 168 hours. BNP (last 3 results) No results for input(s): PROBNP in the last 8760 hours. HbA1C: No results for input(s): HGBA1C in the last 72 hours. CBG:  Recent Labs Lab 09/05/16 2241  GLUCAP 87   Lipid Profile: No results for input(s): CHOL, HDL, LDLCALC, TRIG, CHOLHDL, LDLDIRECT in the last 72 hours. Thyroid Function Tests: No results for input(s): TSH, T4TOTAL, FREET4, T3FREE, THYROIDAB in the last 72 hours. Anemia Panel: No results for input(s): VITAMINB12, FOLATE, FERRITIN, TIBC, IRON, RETICCTPCT in the last 72 hours. Urine analysis:    Component Value Date/Time   COLORURINE YELLOW 10/26/2014 2206   APPEARANCEUR CLEAR 10/26/2014 2206   LABSPEC 1.017 10/26/2014 2206   PHURINE  6.0 10/26/2014 2206   GLUCOSEU NEGATIVE 10/26/2014 2206   HGBUR NEGATIVE 10/26/2014 2206   BILIRUBINUR NEGATIVE 10/26/2014 2206   KETONESUR NEGATIVE 10/26/2014 2206   PROTEINUR NEGATIVE 10/26/2014 2206   UROBILINOGEN 0.2 10/26/2014 2206   NITRITE NEGATIVE 10/26/2014 2206   LEUKOCYTESUR NEGATIVE 10/26/2014 2206   Sepsis Labs: _0 (procalcitonin:4,lacticidven:4) )No results found for this or any previous visit (from the past 240 hour(s)).   Radiological Exams on Admission: Dg Chest 2 View  Result Date: 09/05/2016 CLINICAL DATA:  CHF, atrial flutter, coronary disease, COPD, non ischemic cardiomyopathy, diabetes mellitus, hypertension, paroxysmal atrial fibrillation, smoker EXAM: CHEST  2 VIEW COMPARISON:  07/27/2016 FINDINGS: Enlargement of cardiac silhouette with pulmonary vascular congestion. Interstitial infiltrates likely interstitial pulmonary edema and CHF. RIGHT basilar pleural effusion atelectasis. No pneumothorax. Bones unremarkable. IMPRESSION: CHF with RIGHT pleural effusion. Electronically Signed   By: Lavonia Dana M.D.   On: 09/05/2016 19:22     EKG: Independently reviewed. A flutter, QTC 482.   Assessment/Plan Principal Problem:   Acute on chronic respiratory failure with hypoxia (HCC) Active Problems:   Essential hypertension   Hyperlipidemia    COPD exacerbation (HCC)   Acute on chronic systolic CHF   Morbid obesity (HCC)   PAF (paroxysmal atrial fibrillation) (HCC)   CAD - Non-obstructive by LHC1/16   Uncontrolled type 2 diabetes mellitus with complication (HCC)   COPD (chronic obstructive pulmonary disease) (HCC)   Acute on chronic systolic heart failure (HCC)   CHF exacerbation (HCC)   Skin lesion-left heal   Acute on chronic respiratory failure with hypoxia: Likely due to combination of CHF exacerbation given elevated BNP and worsening leg edema plus CXR findings of CHF and COPD exacerbation given  with wheezing on auscultation. -will admitted to the  telemetry bed as inpatient -Treat CHF and COPD exacerbation as below  Acute on chronic systolic heart failure ([place]): 2-D echo on 07/01/16 showed EF 25-30 percent. Patient is on Lasix 80 mg 3 times a day, metolazone, spironolactone home. -IV Lasix: received 80 mg in ED, will continue 60 mg tid by IV -trop x 3 -2d echo -will continue home coreg, but will decrease dose from 25-12.5 mg bid -Continue home Entresto -Daily weights -strict I/O's -Low salt diet -May need to consult to card CHF team in AM   COPD exacerbation ([place]): mild. -Atrovent nebulizer, prn Xopenex neb -Dulera inhaler  HTN: -continue Entresto, Coreg, hydralazine -IV Lasix  HLD: Last LDL was 72 on 11/01/14 -Continue home medications: Crestor  PAF: CHA2DS2-VASc Score is 5, needs oral anticoagulation. Patient is on Xarelto at home. Heart rate is ~120. -continue coreg and digoxin  -check digoxin level -tele monitoring  CAD - Non-obstructive by LHC1/16: Currently no chest pain -Continue Lipitor, Coreg, Imdur -Follow-up troponin 3 as above  DM-II: Last A1c 10.0 on 07/28/16, poorly controled. Patient is taking metformin, NovoLog, glipizide, Levemir at home -will decrease Lantus dose from 30-20 units daily  -SSI  Skin lesion-left heal: -consult to wound care  Anxiety: -Continue Valium, but decreased dose from 5 mg to 2 mg twice a day prn since patient is drowsy all the time  DVT ppx: On Xarelto Code Status: Full code Family Communication: Yes, patient's wife at bed side Disposition Plan:  Anticipate discharge back to previous home environment Consults called:  none Admission status: Inpatient/tele   Date of Service 09/05/2016    Ivor Costa [facility] Pager [phone]  If 7PM-7AM, please contact night-coverage [email or web address] Password [facility] 09/05/2016, 11:34 PM

## 2016-09-05 NOTE — ED Triage Notes (Signed)
Pt presents to ED with centralized chest pain and SOB that started yesterday. Pt is on 3L at home and has a hx of CHF and diabetes. Pt is tachycardic and lethargic during triage. Also endorses dizziness. A&Ox4.

## 2016-09-06 ENCOUNTER — Inpatient Hospital Stay (HOSPITAL_COMMUNITY): Payer: Medicare Other

## 2016-09-06 ENCOUNTER — Inpatient Hospital Stay (HOSPITAL_COMMUNITY): Payer: Medicare Other | Admitting: Anesthesiology

## 2016-09-06 DIAGNOSIS — I5023 Acute on chronic systolic (congestive) heart failure: Secondary | ICD-10-CM

## 2016-09-06 DIAGNOSIS — I509 Heart failure, unspecified: Secondary | ICD-10-CM

## 2016-09-06 DIAGNOSIS — J9621 Acute and chronic respiratory failure with hypoxia: Secondary | ICD-10-CM

## 2016-09-06 DIAGNOSIS — J449 Chronic obstructive pulmonary disease, unspecified: Secondary | ICD-10-CM

## 2016-09-06 DIAGNOSIS — I2510 Atherosclerotic heart disease of native coronary artery without angina pectoris: Secondary | ICD-10-CM

## 2016-09-06 DIAGNOSIS — G9340 Encephalopathy, unspecified: Secondary | ICD-10-CM

## 2016-09-06 LAB — BLOOD GAS, ARTERIAL
ACID-BASE EXCESS: 9.5 mmol/L — AB (ref 0.0–2.0)
Acid-Base Excess: 7.3 mmol/L — ABNORMAL HIGH (ref 0.0–2.0)
Acid-Base Excess: 9.3 mmol/L — ABNORMAL HIGH (ref 0.0–2.0)
Acid-Base Excess: 11.3 mmol/L — ABNORMAL HIGH (ref 0.0–2.0)
BICARBONATE: 38.7 mmol/L — AB (ref 20.0–28.0)
BICARBONATE: 37.8 mmol/L — AB (ref 20.0–28.0)
Bicarbonate: 36.7 mmol/L — ABNORMAL HIGH (ref 20.0–28.0)
Bicarbonate: 38.1 mmol/L — ABNORMAL HIGH (ref 20.0–28.0)
Drawn by: 276051
Drawn by: 257701
Drawn by: 422461
Drawn by: 422461
FIO2: 30
FIO2: 100
FIO2: 60
LHR: 14 {breaths}/min
LHR: 14 {breaths}/min
MECHVT: 500 mL
O2 Content: 1 L/min
O2 SAT: 98.3 %
O2 SAT: 96.7 %
O2 Saturation: 69.6 %
O2 Saturation: 90.1 %
PATIENT TEMPERATURE: 99.2
PCO2 ART: 62.9 mmHg — AB (ref 32.0–48.0)
PEEP/CPAP: 5 cmH2O
PEEP/CPAP: 5 cmH2O
PEEP: 6 cmH2O
PH ART: 7.265 — AB (ref 7.350–7.450)
PH ART: 7.397 (ref 7.350–7.450)
Patient temperature: 98.6
Patient temperature: 98.6
Patient temperature: 98.9
Pressure control: 14 cmH2O
RATE: 14 {breaths}/min
VT: 620 mL
pCO2 arterial: 86.7 mmHg (ref 32.0–48.0)
pCO2 arterial: 82.3 mmHg (ref 32.0–48.0)
pCO2 arterial: 88.4 mmHg (ref 32.0–48.0)
pH, Arterial: 7.249 — ABNORMAL LOW (ref 7.350–7.450)
pH, Arterial: 7.287 — ABNORMAL LOW (ref 7.350–7.450)
pO2, Arterial: 39.7 mmHg — CL (ref 83.0–108.0)
pO2, Arterial: 62.5 mmHg — ABNORMAL LOW (ref 83.0–108.0)
pO2, Arterial: 132 mmHg — ABNORMAL HIGH (ref 83.0–108.0)
pO2, Arterial: 87.2 mmHg (ref 83.0–108.0)

## 2016-09-06 LAB — ECHOCARDIOGRAM COMPLETE
Height: 72 in
Weight: 4719.61 oz

## 2016-09-06 LAB — GLUCOSE, CAPILLARY
GLUCOSE-CAPILLARY: 93 mg/dL (ref 65–99)
Glucose-Capillary: 171 mg/dL — ABNORMAL HIGH (ref 65–99)
Glucose-Capillary: 164 mg/dL — ABNORMAL HIGH (ref 65–99)
Glucose-Capillary: 99 mg/dL (ref 65–99)
Glucose-Capillary: 83 mg/dL (ref 65–99)

## 2016-09-06 LAB — CBC WITH DIFFERENTIAL/PLATELET
BASOS ABS: 0.1 10*3/uL (ref 0.0–0.1)
Basophils Relative: 1 %
Eosinophils Absolute: 0 10*3/uL (ref 0.0–0.7)
Eosinophils Relative: 0 %
HCT: 34.7 % — ABNORMAL LOW (ref 39.0–52.0)
HEMOGLOBIN: 10.7 g/dL — AB (ref 13.0–17.0)
LYMPHS PCT: 15 %
Lymphs Abs: 1.6 10*3/uL (ref 0.7–4.0)
MCH: 27.2 pg (ref 26.0–34.0)
MCHC: 30.8 g/dL (ref 30.0–36.0)
MCV: 88.1 fL (ref 78.0–100.0)
MONOS PCT: 8 %
Monocytes Absolute: 0.9 10*3/uL (ref 0.1–1.0)
NEUTROS ABS: 8.3 10*3/uL — AB (ref 1.7–7.7)
Neutrophils Relative %: 76 %
Platelets: 424 10*3/uL — ABNORMAL HIGH (ref 150–400)
RBC: 3.94 MIL/uL — AB (ref 4.22–5.81)
RDW: 17.5 % — ABNORMAL HIGH (ref 11.5–15.5)
WBC: 10.9 10*3/uL — AB (ref 4.0–10.5)

## 2016-09-06 LAB — BASIC METABOLIC PANEL
ANION GAP: 5 (ref 5–15)
BUN: 13 mg/dL (ref 6–20)
CALCIUM: 8.4 mg/dL — AB (ref 8.9–10.3)
CO2: 34 mmol/L — ABNORMAL HIGH (ref 22–32)
Chloride: 100 mmol/L — ABNORMAL LOW (ref 101–111)
Creatinine, Ser: 0.77 mg/dL (ref 0.61–1.24)
GLUCOSE: 140 mg/dL — AB (ref 65–99)
POTASSIUM: 4.1 mmol/L (ref 3.5–5.1)
Sodium: 139 mmol/L (ref 135–145)

## 2016-09-06 LAB — TROPONIN I
TROPONIN I: 0.06 ng/mL — AB (ref <0.03)
TROPONIN I: 0.06 ng/mL — AB (ref <0.03)
TROPONIN I: 0.04 ng/mL — AB (ref <0.03)

## 2016-09-06 LAB — MRSA PCR SCREENING: MRSA by PCR: NEGATIVE

## 2016-09-06 LAB — DIGOXIN LEVEL

## 2016-09-06 MED ORDER — CHLORHEXIDINE GLUCONATE 0.12% ORAL RINSE (MEDLINE KIT)
15.0000 mL | Freq: Two times a day (BID) | OROMUCOSAL | Status: DC
  Administered 2016-09-06 – 2016-09-10 (×8): 15 mL via OROMUCOSAL

## 2016-09-06 MED ORDER — METOPROLOL TARTRATE 5 MG/5ML IV SOLN
2.5000 mg | Freq: Four times a day (QID) | INTRAVENOUS | Status: DC | PRN
  Administered 2016-09-06: 2.5 mg via INTRAVENOUS
  Filled 2016-09-06: qty 5

## 2016-09-06 MED ORDER — SUCRALFATE 1 GM/10ML PO SUSP
1.0000 g | Freq: Four times a day (QID) | ORAL | Status: DC
  Administered 2016-09-06 – 2016-09-07 (×2): 1 g via ORAL
  Filled 2016-09-06 (×2): qty 10

## 2016-09-06 MED ORDER — RIVAROXABAN 20 MG PO TABS
20.0000 mg | ORAL_TABLET | Freq: Every day | ORAL | Status: DC
  Administered 2016-09-06: 20 mg via ORAL
  Filled 2016-09-06: qty 1

## 2016-09-06 MED ORDER — ORAL CARE MOUTH RINSE
15.0000 mL | Freq: Four times a day (QID) | OROMUCOSAL | Status: DC
  Administered 2016-09-06 – 2016-09-08 (×6): 15 mL via OROMUCOSAL

## 2016-09-06 MED ORDER — FENTANYL CITRATE (PF) 2500 MCG/50ML IJ SOLN
0.0000 ug/h | INTRAMUSCULAR | Status: DC
  Administered 2016-09-06: 25 ug/h via INTRAVENOUS
  Administered 2016-09-07: 200 ug/h via INTRAVENOUS
  Filled 2016-09-06 (×3): qty 50

## 2016-09-06 MED ORDER — PERFLUTREN LIPID MICROSPHERE
1.0000 mL | INTRAVENOUS | Status: AC | PRN
  Administered 2016-09-06: 2 mL via INTRAVENOUS
  Filled 2016-09-06: qty 10

## 2016-09-06 MED ORDER — METOPROLOL TARTRATE 5 MG/5ML IV SOLN: 2.5000 mg | Freq: Four times a day (QID) | INTRAVENOUS | Status: DC | PRN

## 2016-09-06 MED ORDER — SUCCINYLCHOLINE CHLORIDE 20 MG/ML IJ SOLN
INTRAMUSCULAR | Status: DC | PRN
  Administered 2016-09-06 (×2): 100 mg via INTRAVENOUS

## 2016-09-06 MED ORDER — FENTANYL BOLUS VIA INFUSION
25.0000 ug | INTRAVENOUS | Status: DC | PRN
  Administered 2016-09-06 – 2016-09-07 (×5): 50 ug via INTRAVENOUS
  Filled 2016-09-06: qty 50

## 2016-09-06 MED ORDER — PROPOFOL 10 MG/ML IV BOLUS
INTRAVENOUS | Status: DC | PRN
  Administered 2016-09-06 (×2): 50 mg via INTRAVENOUS

## 2016-09-06 NOTE — Progress Notes (Signed)
Called to icu for urgent intubation patient c multiple comorbidties well outlined in chart. Very hypercarbic and somnolent. Plan intubation

## 2016-09-06 NOTE — Progress Notes (Signed)
Pt having moderate clots and blood in OG tube, paged MD to notify and request CBC or to guaiac gastric residuals.

## 2016-09-06 NOTE — Progress Notes (Signed)
OG tube advanced 8cm per MD order.  Placement verified by auscultation of 2 Rn's.

## 2016-09-06 NOTE — Progress Notes (Signed)
CRITICAL VALUE ALERT  Critical value received:  Troponin-0.06  Date of notification:  09/06/16   Time of notification:  0010  Critical value read back:Yes.    Nurse who received alert:  Sheffield Slider  MD notified (1st page):  Dr. Blaine Hamper  Time of first page:  0020  MD notified (2nd page): M. Lynch-NP  Time of second page:  Responding MD: [id number]  Time MD responded:  8453

## 2016-09-06 NOTE — Progress Notes (Signed)
OT Cancellation Note  Patient Details Name: Shafter Jupin MRN: [id number] DOB: 1946-09-18   Cancelled Treatment:    Reason Eval/Treat Not Completed: Medical issues which prohibited therapy.  Pt transferred to ICU due to respiratory failure and need for Bipap.  Will initiate eval once medically stable.  [place], OTR/L [phone]   Lucille Passy M 09/06/2016, 3:43 PM

## 2016-09-06 NOTE — Significant Event (Signed)
Rapid Response Event Note  Overview: Called by bedside RN at 1250 pm  saying that they may need to call a code stroke and are waiting to take the pt to MRI. Said pt is not responding and not sure when he was last seen normal.         Initial Focused Assessment: Pt lying in bed, very somnolent, will barely open eyes to rigorous stimuli. Respirations very shallow, 02 sats 97% on 3 L.  Wife at bedside and says he does like this every morning, then he will wake up.  Asked her if he uses a CPAP machine at home, she said he has one but does not use it.     Interventions:  Connected pt to CO2 monitor with readings of 57-63.  Dr Charlies Silvers called to get orders for ABG and CXR.    Plan of Care (if not transferred):  Event Summary: Pt transferred to rm 1230 for BiPap, and CCM to evaluate. Pt now a little more awake, asking for water.     at      at          [place], [place]

## 2016-09-06 NOTE — Progress Notes (Signed)
PCCM Initial Consult Note  Asked to see Mr Nicholas Caldwell for lethargy in setting evolving acute resp failure.  He has hx obesity, COPD and systolic CHF, was admittd 11/3 pm with B infiltrates superimposed on cardiomegaly on CXR. Has received diuresis, BD's. AM 11/4 was more somnolent.  ABG performed (? a VBG) that shows hypercapnia and hypoxemia. He moves now to ICU for BiPAP. He has received methadone but I do not see any other sedating meds given  Vitals:   09/06/16 0029 09/06/16 0500 09/06/16 0655 09/06/16 1330  BP:  123/70    Pulse:  79    Resp:  20    Temp:  97.4 F (36.3 C)    TempSrc:  Oral    SpO2:  90% 93% 93%  Weight: 132.9 kg (292 lb 15.9 oz)  133.8 kg (294 lb 15.6 oz)   Height: 6' (1.829 m)       On my eval he wakes to voice then immediately back to sleep.  B insp crackles and exp rhonchi. No wheezes.    Recent Labs Lab 09/06/16 1233  PHART 7.249*  PCO2ART 86.7*  PO2ART 39.7*  HCO3 36.7*  O2SAT 69.6   Agree w diuresis Continue BiPAp and follow for improvement in MS, oxygenation Consider narcan  ABG at 15:30 > if not improving then may need intubation depending on degree of lethargy Will hold off on MRI brian for now, do not want him in scanner when he is unstable. Will reorder neuro eval once stabilized and if lethargy does not improve.   Full consult to follow.    Baltazar Apo, MD, PhD 09/06/2016, 2:14 PM [facility] [phone] or if no answer [phone]

## 2016-09-06 NOTE — Progress Notes (Signed)
PT Cancellation Note  Patient Details Name: Nicholas Caldwell MRN: [id number] DOB: 07/06/1946   Cancelled Treatment:    Reason Eval/Treat Not Completed: Medical issues which prohibited therapy Pt with cognitive changes per RN.  MRI ordered.  Will await results.   Pearson Picou,KATHrine E 09/06/2016, 12:37 PM Carmelia Bake, PT, DPT 09/06/2016 Pager: [phone]

## 2016-09-06 NOTE — Progress Notes (Addendum)
[place] Progress Note Patient Name: Nicholas Caldwell DOB: [birth year] MRN: [id number]   Date of Service  09/06/2016  HPI/Events of Note  Notified of need for stress ulcer prophylaxis. Patient has prolonged QTc interval history, therefore, can't use Pepcid or Protonix.   eICU Interventions  Will order: 1. Pepcid 20 mg IV now and Q 12 hours.       Intervention Category Intermediate Interventions: Best-practice therapies (e.g. DVT, beta blocker, etc.)  Sommer,Steven Eugene 09/06/2016, 6:20 PM

## 2016-09-06 NOTE — Progress Notes (Signed)
[facility] Progress Note Patient Name: Samael Blades DOB: 1946-10-07 MRN: [id number]   Date of Service  09/06/2016  HPI/Events of Note  Multiple issues: 1. HR = 130 - Sinus Tachycardia. Likely d/t heart failure, 2. Assess OGT placement - OGT in proximal stomach and 3. Dark material from OGT and 4. ABG on 100%/PRVC 14/TV 500/P 5 = 7.26/88/132.  eICU Interventions  Will order: 1. Metoprolol 2.5 mg IV Q 6 hours PRN HR > 115. 2. CBC and platelets now. 3. Increase TV to 620. 4. ABG at 10 PM. 5. Advance OGT 8 cm.      Intervention Category Major Interventions: Acid-Base disturbance - evaluation and management;Respiratory failure - evaluation and management;Arrhythmia - evaluation and management  Sommer,Steven Eugene 09/06/2016, 8:57 PM

## 2016-09-06 NOTE — Progress Notes (Signed)
[place] Progress Note Patient Name: Jourdan Durbin DOB: 09-13-1946 MRN: [id number]   Date of Service  09/06/2016  HPI/Events of Note  Agitiation - request for bilateral wrist restraints and sedation. Hx of prolonged Qtc interval, therefore, can't use Propofol.  eICU Interventions  Will order: 1. Fentanyl IV infusion. Titrate to RASS = 0 to -1. 2. Fentanyl 25-50 mcg IV bolus from infusion Q 1 hour PRN sedation, agitation or pain.      Intervention Category Minor Interventions: Agitation / anxiety - evaluation and management  Lysle Dingwall 09/06/2016, 5:44 PM

## 2016-09-06 NOTE — Progress Notes (Signed)
  Echocardiogram 2D Echocardiogram with Definity has been performed.  Nicholas Caldwell 09/06/2016, 3:33 PM

## 2016-09-06 NOTE — Consult Note (Signed)
PULMONARY / CRITICAL CARE MEDICINE   Name: Nicholas Caldwell MRN: [id number] DOB: 1945/12/07    ADMISSION DATE:  09/05/2016 CONSULTATION DATE:  09/06/16  REFERRING MD:  Dr Charlies Silvers  CHIEF COMPLAINT:  Lethargy, AMS  HISTORY OF PRESENT ILLNESS:   70 yo obese man, hx OSA (not on CPAP), COPD, NICM with systolic CHF, parox A fib, hx prolonged QT-c. He was admitted 11/3 with acute dyspnea in setting B interstitial infiltrates on CXR. He was diuresed. On 11/4 he became lethargic, hypoxemic. ABG with hypercapnia. He moved to the ICu with severe lethargy to start BiPAP.  Some concern for possible CVA given the acute change in MS. He received methadone last pm, did not receive this am. PCCM consulted to eval   PAST MEDICAL HISTORY :  He  has a past medical history of Atrial flutter ([place]); CAD (coronary artery disease); Chronic pain; Chronic systolic CHF (congestive heart failure) (HCC); COPD (chronic obstructive pulmonary disease) ([place]); Diabetes mellitus without complication ([place]); Hypercholesteremia; Hypertension; NICM (nonischemic cardiomyopathy) ([place]); PAF (paroxysmal atrial fibrillation) ([place]); Prolonged Q-T interval on ECG; Pulmonary hypertension; and Tobacco abuse.  PAST SURGICAL HISTORY: He  has a past surgical history that includes left and right heart catheterization with coronary angiogram (N/A, 11/06/2014) and Splenectomy.  No Known Allergies  No current facility-administered medications on file prior to encounter.    Current Outpatient Prescriptions on File Prior to Encounter  Medication Sig  . albuterol (PROVENTIL HFA;VENTOLIN HFA) 108 (90 BASE) MCG/ACT inhaler Inhale 2 puffs into the lungs every 6 (six) hours as needed for wheezing or shortness of breath.  . carvedilol (COREG) 25 MG tablet Take 25 mg by mouth 2 (two) times daily with a meal.  . diazepam (VALIUM) 10 MG tablet Take 0.5 tablets (5 mg total) by mouth daily as needed for anxiety or sleep.  Marland Kitchen diclofenac sodium (VOLTAREN) 1 % GEL  Apply 2 g topically 2 (two) times daily as needed (for pain).  Marland Kitchen digoxin (LANOXIN) 0.125 MG tablet Take 1 tablet (0.125 mg total) by mouth daily.  . fluticasone (FLONASE) 50 MCG/ACT nasal spray Place 2 sprays into both nostrils daily as needed for allergies.   . furosemide (LASIX) 40 MG tablet Take 2 tablets (80 mg total) by mouth 3 (three) times daily.  Marland Kitchen gabapentin (NEURONTIN) 600 MG tablet Take 1-3 tablets (600-1,800 mg total) by mouth See admin instructions. Take 1 tablet (600 mg) by mouth daily with lunch,3 tablets (1800 mg) at bedtime  . glipiZIDE (GLUCOTROL) 10 MG tablet Take 10 mg by mouth 2 (two) times daily before a meal.   . glucose 4 GM chewable tablet Chew 4 tablets by mouth See admin instructions. Chew 4 tablets by mouth as needed for low blood sugar - repeat every 15 minutes if blood sugar less than 70  . hydrALAZINE (APRESOLINE) 25 MG tablet Take 1 tablet (25 mg total) by mouth 3 (three) times daily.  . hydrocortisone (ANUSOL-HC) 2.5 % rectal cream Place 1 application rectally 2 (two) times daily as needed for hemorrhoids or itching. Use up to 2 weeks at a time as needed  . hydrocortisone 1 % lotion Apply 1 application topically See admin instructions. Apply small amount to back twice daily for itchy rash  . insulin aspart (NOVOLOG) 100 UNIT/ML injection CBG 70 - 120: 0 units CBG 121 - 150: 2 units CBG 151 - 200: 3 units CBG 201 - 250: 5 units CBG 251 - 300: 8 units CBG 301 - 350: 11 units CBG 351 -  400: 15 units (Patient taking differently: Inject 0-15 Units into the skin 3 (three) times daily as needed for high blood sugar. CBG 70 - 120: 0 units; CBG 121 - 150: 2 units; CBG 151 - 200: 3 units; CBG 201 - 250: 5 units; CBG 251 - 300: 8 units; CBG 301 - 350: 11 units; CBG 351 - 400: 15 units)  . insulin detemir (LEVEMIR) 100 UNIT/ML injection Inject 0.3 mLs (30 Units total) into the skin every evening. (Patient taking differently: Inject 44 Units into the skin every evening. )  .  ipratropium (ATROVENT) 0.02 % nebulizer solution Take 2.5 mLs (0.5 mg total) by nebulization every 6 (six) hours as needed for wheezing or shortness of breath.  . isosorbide mononitrate (IMDUR) 30 MG 24 hr tablet Take 1 tablet (30 mg total) by mouth daily.  Marland Kitchen levalbuterol (XOPENEX) 0.63 MG/3ML nebulizer solution Take 3 mLs (0.63 mg total) by nebulization every 8 (eight) hours as needed for wheezing or shortness of breath.  . metFORMIN (GLUCOPHAGE-XR) 500 MG 24 hr tablet Take 1,000 mg by mouth 2 (two) times daily with a meal.  . methadone (DOLOPHINE) 10 MG tablet Take 1 tablet (10 mg total) by mouth every 12 (twelve) hours.  . metolazone (ZAROXOLYN) 2.5 MG tablet Take 1 tablet (2.5 mg total) by mouth as directed. 1 tablet every Tuesday 30 minutes before morning lasix (Patient taking differently: Take 2.5 mg by mouth every Tuesday. 1 tablet every Tuesday 30 minutes before morning lasix)  . mometasone-formoterol (DULERA) 200-5 MCG/ACT AERO Inhale 2 puffs into the lungs 2 (two) times daily.  . potassium chloride SA (K-DUR,KLOR-CON) 20 MEQ tablet Take 2 tablets (40 mEq total) by mouth daily.  . rivaroxaban (XARELTO) 20 MG TABS tablet Take 1 tablet (20 mg total) by mouth daily with supper.  . rosuvastatin (CRESTOR) 40 MG tablet Take 20 mg by mouth daily. For cholesterol  . sacubitril-valsartan (ENTRESTO) 49-51 MG Take 1 tablet by mouth 2 (two) times daily.  Marland Kitchen spironolactone (ALDACTONE) 25 MG tablet Take 1 tablet (25 mg total) by mouth daily.    FAMILY HISTORY:  His indicated that his mother is deceased. He indicated that his father is deceased. He indicated that the status of his sister is unknown. He indicated that the status of his neg hx is unknown.    SOCIAL HISTORY: He  reports that he has been smoking Cigarettes.  He has been smoking about 0.05 packs per day. He has never used smokeless tobacco. He reports that he does not drink alcohol or use drugs.  REVIEW OF SYSTEMS:   Pt unable to give hx    SUBJECTIVE:  Pt will briefly wake, denies pain  VITAL SIGNS: BP 118/76   Pulse (!) 130   Temp 98.9 F (37.2 C) (Oral)   Resp 14   Ht 6' (1.829 m)   Wt 131.4 kg (289 lb 11 oz)   SpO2 100%   BMI 39.29 kg/m   HEMODYNAMICS:    VENTILATOR SETTINGS: Vent Mode: PRVC FiO2 (%):  [30 %-100 %] 100 % Set Rate:  [14 bmp] 14 bmp Vt Set:  [500 mL] 500 mL PEEP:  [5 cmH20-6 cmH20] 5 cmH20 Plateau Pressure:  [25 cmH20] 25 cmH20  INTAKE / OUTPUT: I/O last 3 completed shifts: In: 15.2 [I.V.:15.2] Out: 1225 [Urine:1225]  PHYSICAL EXAMINATION: General:  Obese man, on BiPAP,  Neuro:  Difficult to rouse, does open eyes w stim, speaks. Did not follow commands.  HEENT:  BiPAP in place, PERRL  Cardiovascular:  Regular, no M Lungs:  B insp rhonchi and crackles, no wheeze Abdomen:  Non-tender Musculoskeletal:  No edema Skin:  No rash  LABS:  BMET  Recent Labs Lab 09/05/16 1936 09/06/16 0335  NA 138 139  K 4.2 4.1  CL 102 100*  CO2 30 34*  BUN 12 13  CREATININE 0.81 0.77  GLUCOSE 129* 140*    Electrolytes  Recent Labs Lab 09/05/16 1936 09/06/16 0335  CALCIUM 8.5* 8.4*    CBC  Recent Labs Lab 09/05/16 1936  WBC 8.7  HGB 11.5*  HCT 37.6*  PLT 428*    Coag's  Recent Labs Lab 09/05/16 1936  INR 1.14    Sepsis Markers No results for input(s): LATICACIDVEN, PROCALCITON, O2SATVEN in the last 168 hours.  ABG  Recent Labs Lab 09/06/16 1233 09/06/16 1614  PHART 7.249* 7.287*  PCO2ART 86.7* 82.3*  PO2ART 39.7* 62.5*    Liver Enzymes No results for input(s): AST, ALT, ALKPHOS, BILITOT, ALBUMIN in the last 168 hours.  Cardiac Enzymes  Recent Labs Lab 09/05/16 2311 09/06/16 0335 09/06/16 0951  TROPONINI 0.06* 0.06* 0.04*    Glucose  Recent Labs Lab 09/05/16 2241 09/06/16 0830 09/06/16 1124 09/06/16 1557  GLUCAP 87 171* 164* 99    Imaging Dg Chest Port 1 View  Result Date: 09/06/2016 CLINICAL DATA:  Initial evaluation for  endotracheal tube placement. EXAM: PORTABLE CHEST 1 VIEW COMPARISON:  Prior radiograph from earlier the same day. FINDINGS: Study is limited as the patient is markedly rotated to the right. Patient is now intubated with an endotracheal tube in place. Tip of the tube is positioned approximately 2.5 cm above the carina. Enteric tube courses in the the abdomen. Cardiomegaly grossly stable. Changes related congestive heart failure again noted, relatively stable. No pneumothorax. Osseous structures unchanged. IMPRESSION: 1. Tip of the endotracheal tube approximately 2.5 cm above the carina. 2. Little interval change in appearance of the chest with cardiomegaly and findings consistent with congestive heart failure. Electronically Signed   By: Jeannine Boga M.D.   On: 09/06/2016 18:19   Dg Chest Port 1 View  Result Date: 09/06/2016 CLINICAL DATA:  Compromised respiration EXAM: PORTABLE CHEST 1 VIEW COMPARISON:  Chest radiograph from one day prior. FINDINGS: Stable cardiomediastinal silhouette with cardiomegaly. No pneumothorax. Stable small right pleural effusion. Moderate pulmonary edema appears slightly worsened. Bibasilar lung opacities appear stable. IMPRESSION: 1. Moderate congestive heart failure, slightly worsened. 2. Stable small right pleural effusion. 3. Stable bibasilar lung opacities, favor atelectasis. Electronically Signed   By: Ilona Sorrel M.D.   On: 09/06/2016 13:02   Dg Abd Portable 1v  Result Date: 09/06/2016 CLINICAL DATA:  NG tube placement. EXAM: PORTABLE ABDOMEN - 1 VIEW COMPARISON:  Itch chest radiograph of earlier today. FINDINGS: Nasogastric terminates at the body of the stomach. Side port is not well visualized. No gross bowel obstruction Cardiomegaly. IMPRESSION: Nasogastric at the body of the stomach. Consider minimal of enhancement. Electronically Signed   By: Abigail Miyamoto M.D.   On: 09/06/2016 18:18     STUDIES:   CULTURES:  ANTIBIOTICS:  SIGNIFICANT EVENTS: Acute  altered MS 11/4, started BiPAP  LINES/TUBES:  DISCUSSION: 70 yo man, hx COPD, systolic CHF, COPD, untreated OSA. Admitted with acute resp failure and suspected sCHF exacerbation. Experienced lethargy and declining MS 11/4. Started on BiPAP.   ASSESSMENT / PLAN:  PULMONARY A: Acute hypoxemic and hypercapneic respiratory failure OSA COPD  Without apparent exacerbation.  P:   BiPAP initiated Will recheck ABG  after few hours on BiPAP At risk intubation for airway protection and for resp acidosis.  BD's as ordered   CARDIOVASCULAR A:  HTN NICM Acute on chronic systolic CHF Hyperlipidemia  P:  Diuresis as able to tolerate Coreg, digoxin, hydralazine, Imdur, crestor  RENAL A:   No acute issues P:   Diuresis as renal fxn and BP tolerate  GASTROINTESTINAL A:   SUP  P:   carafate   HEMATOLOGIC A:   No acute issues P:   INFECTIOUS A:   No evidence acute infection P:   Follow fever curve, WBC   ENDOCRINE A:   DM   P:   Hold glipizide SSI  NEUROLOGIC A:   Lethargy and AMS. Suspect that this is due hypercapnia. He will wake briefly to voice P:   RASS goal: 0 Follow MS on BiPAP, suspect should improve as hypercapnia resolves.  Will defer MRI brain for now given current instability. If he does not improve then would intubate to facilitate safe MRI brain.    FAMILY  - Updates: No family available at bedside.   - Inter-disciplinary family meet or Palliative Care meeting due by:  09/13/16  Independent CC time 48 minutes   Baltazar Apo, MD, PhD 09/06/2016, 7:37 PM [facility] [phone] or if no answer [phone]

## 2016-09-06 NOTE — Progress Notes (Signed)
[facility] Progress Note Patient Name: Cephus Tupy DOB: 19-Nov-1945 MRN: [id number]   Date of Service  09/06/2016  HPI/Events of Note  Respiratory Failure - Patient has not improved on BiPAP. ABG prior to BiPAP = 7.24/86.7/39.7/36.7 >> 7.28/82/62.5. LOC remains obtunded.   eICU Interventions  Will order: 1. Ask anesthesia to intubate the patient. 2. Ventilator settings: 100%/PRVC 14/TV 500/P 5. 3. Portable CXR at 6 PM. 4. ABG at 6:30 PM.  5. Will need sedation if his LOC improves with resolution of hypercarbia.      Intervention Category Major Interventions: Respiratory failure - evaluation and management  Danah Reinecke [place] 09/06/2016, 5:03 PM

## 2016-09-06 NOTE — Progress Notes (Signed)
Patient ID: Nicholas Caldwell, male   DOB: 1946/03/03, 70 y.o.   MRN: [id number]  PROGRESS NOTE    Nicholas Caldwell  TDD:[id number] DOB: [birth year] DOA: 09/05/2016  PCP: [facility]   Brief Narrative:  70 y.o. male with medical history significant for systolic CHF, EF 62-37%, COPD and chronic respiratory failure with hypoxia, oxygen dependent, on 3 L [place] oxygen support at baseline, tobacco abuse, PAF on xarelto, pulmonary hypertension, CAD, chronic pain syndrome, s/p splenectomy. Pt presented to [facility] ED with worsening shortness of breath and leg edema over past 24 hours prior to the admission. On admission, pt was hemodynamically stable. Blood work showed hgb of 11.5, , troponin 0.06, BNP 531. The 12 lead EKG showed atrial flutter. CXR showed CHF and right pleural effusion. He was started on IV lasix.    Assessment & Plan:   Principal Problem:   Acute on chronic respiratory failure with hypoxia (HCC) / Acute on chronic systolic and diastolic CHF - Started IV lasix - BNP in 500 range on admission - 2 D ECHO in 06/2016 showed EF 25%; ECHO on this admission pending  - Continue daily weight and strict intake and output   Active Problems:   Essential hypertension - Continue carvedilol, lasix, hydralazine, imdur     Hyperlipidemia  - Continue Crestor    COPD ([place]) - Continue Dulera    Morbid obesity due to excess calories ([place]) - Counseled on diet and nutrition    PAF (paroxysmal atrial fibrillation) (HCC) - CHADS vasc score 4 - On Digoxin for rhythm control - On Carvedilol for HR control - On xarelto for [facility]    CAD - Non-obstructive by LHC1/16 - Continue xarelto - No chest pain    Uncontrolled type 2 diabetes mellitus with diabetic neuropathy with long term insulin use (HCC) - Continue levemir 20 units at bedtime and SSI - Continue gabapentin for neuropathy    DVT prophylaxis: On xarelto  Code Status: full code  Family Communication: wife at the bedside this am    Disposition Plan: home once adequately diuresed.    Consultants:   None   Procedures:   None   Antimicrobials:   None    Subjective: No overnight events.   Objective: Vitals:   09/05/16 2308 09/06/16 0029 09/06/16 0500 09/06/16 0655  BP:   123/70   Pulse:   79   Resp:   20   Temp:   97.4 F (36.3 C)   TempSrc:   Oral   SpO2: 93%  90% 93%  Weight:  132.9 kg (292 lb 15.9 oz)  133.8 kg (294 lb 15.6 oz)  Height:  6' (1.829 m)      Intake/Output Summary (Last 24 hours) at 09/06/16 1051 Last data filed at 09/05/16 2332  Gross per 24 hour  Intake                0 ml  Output             1225 ml  Net            -1225 ml   Filed Weights   09/06/16 0029 09/06/16 0655  Weight: 132.9 kg (292 lb 15.9 oz) 133.8 kg (294 lb 15.6 oz)    Examination:  General exam: Appears calm and comfortable  Respiratory system: crackles at basses, no wheezing  Cardiovascular system: S1 & S2 heard, Rate controlled  Gastrointestinal system: Abdomen is nondistended, soft and nontender. No organomegaly or masses felt. Normal bowel sounds heard.  Central nervous system: Alert and oriented. No focal neurological deficits. Extremities: LE +1-2 pitting edema, non tender  Skin: No rashes, lesions or ulcers Psychiatry: Judgement and insight appear normal. Mood & affect appropriate.   Data Reviewed: I have personally reviewed following labs and imaging studies  CBC:  Recent Labs Lab 09/05/16 1936  WBC 8.7  NEUTROABS 6.0  HGB 11.5*  HCT 37.6*  MCV 88.1  PLT 010*   Basic Metabolic Panel:  Recent Labs Lab 09/05/16 1936 09/06/16 0335  NA 138 139  K 4.2 4.1  CL 102 100*  CO2 30 34*  GLUCOSE 129* 140*  BUN 12 13  CREATININE 0.81 0.77  CALCIUM 8.5* 8.4*   GFR: Estimated Creatinine Clearance: 125.1 mL/min (by C-G formula based on SCr of 0.77 mg/dL). Liver Function Tests: No results for input(s): AST, ALT, ALKPHOS, BILITOT, PROT, ALBUMIN in the last 168 hours. No results for  input(s): LIPASE, AMYLASE in the last 168 hours. No results for input(s): AMMONIA in the last 168 hours. Coagulation Profile:  Recent Labs Lab 09/05/16 1936  INR 1.14   Cardiac Enzymes:  Recent Labs Lab 09/05/16 2311 09/06/16 0335 09/06/16 0951  TROPONINI 0.06* 0.06* 0.04*   BNP (last 3 results) No results for input(s): PROBNP in the last 8760 hours. HbA1C: No results for input(s): HGBA1C in the last 72 hours. CBG:  Recent Labs Lab 09/05/16 2241 09/06/16 0830  GLUCAP 87 171*   Lipid Profile: No results for input(s): CHOL, HDL, LDLCALC, TRIG, CHOLHDL, LDLDIRECT in the last 72 hours. Thyroid Function Tests: No results for input(s): TSH, T4TOTAL, FREET4, T3FREE, THYROIDAB in the last 72 hours. Anemia Panel: No results for input(s): VITAMINB12, FOLATE, FERRITIN, TIBC, IRON, RETICCTPCT in the last 72 hours. Urine analysis:    Component Value Date/Time   COLORURINE YELLOW 10/26/2014 2206   APPEARANCEUR CLEAR 10/26/2014 2206   LABSPEC 1.017 10/26/2014 2206   PHURINE 6.0 10/26/2014 2206   GLUCOSEU NEGATIVE 10/26/2014 2206   HGBUR NEGATIVE 10/26/2014 2206   BILIRUBINUR NEGATIVE 10/26/2014 2206   KETONESUR NEGATIVE 10/26/2014 2206   PROTEINUR NEGATIVE 10/26/2014 2206   UROBILINOGEN 0.2 10/26/2014 2206   NITRITE NEGATIVE 10/26/2014 2206   LEUKOCYTESUR NEGATIVE 10/26/2014 2206   Sepsis Labs: _0 (procalcitonin:4,lacticidven:4)   )No results found for this or any previous visit (from the past 240 hour(s)).    Radiology Studies: Dg Chest 2 View Result Date: 09/05/2016 CHF with RIGHT pleural effusion. Electronically Signed   By: Lavonia Dana M.D.   On: 09/05/2016 19:22    Scheduled Meds: . carvedilol  12.5 mg Oral BID WC  . digoxin  0.125 mg Oral Daily  . furosemide  60 mg Intravenous Q8H  . gabapentin  1,800 mg Oral QHS  . gabapentin  600 mg Oral Q lunch  . hydrALAZINE  25 mg Oral TID  . hydrocortisone  1 application Topical BID  . insulin aspart  0-9  Units Subcutaneous TID WC  . insulin detemir  20 Units Subcutaneous QPM  . isosorbide mononitrate  30 mg Oral Daily  . methadone  10 mg Oral Q12H  . mometasone-formoterol  2 puff Inhalation BID  . rivaroxaban  20 mg Oral QHS  . rosuvastatin  20 mg Oral Daily  . sacubitril-valsartan  1 tablet Oral BID  . sodium chloride flush  3 mL Intravenous Q12H   Continuous Infusions:    LOS: 1 day    Time spent: 25 minutes  Greater than 50% of the time spent on counseling and coordinating  the care.   Leisa Lenz, MD [facility] Pager [phone]  If 7PM-7AM, please contact night-coverage [email or web address] Password [email or web address] 09/06/2016, 10:51 AM

## 2016-09-06 NOTE — Progress Notes (Signed)
Pt more lethargic now although can be woken up but cant follow commands properly. His vitals are stable. We will active code stroke and order placed for MRI brain, CXR, ABG. Pt will be transferred to SDU and called CCM as well, we appreciate their consult and recommendations. Leisa Lenz [facility] [phone]

## 2016-09-06 NOTE — Consult Note (Signed)
[facility] Nurse wound consult note Reason for Consult:  Left heel pressure injury, Stage 3 Wound type: Pressure Pressure Ulcer POA: Yes Measurement: 6cm x 9cm area of partial thickness involvement with 3.5cm x 2.5cm area of depth in the center. Wound bed: Red, dry at periphery, light yellow and moist in the center Drainage (amount, consistency, odor) small amount of serous exudate Periwound: intact, dry. LEs with edema, L>R. Dressing procedure/placement/frequency: I will provide Nursing with guidance via the Orders for conservative topical care of this ulcer.  The cornerstone of treatment will be floatation of the heel to redistribute pressure, and correction of the foot alignment and avoid inward rotation.  Patient is on a mattress replacement and will require turning and repositioning.  The sacrum is intact, but I will suggest placement of a prophylactic sacral foam dressing to reduce likelihood of pressure injury in this high-risk individual. [facility] nursing team will not follow, but will remain available to this patient, the nursing and medical teams.  Please re-consult if needed. Thanks, Maudie Flakes, MSN, RN, [place], Arther Abbott  Pager# [phone]

## 2016-09-06 NOTE — Progress Notes (Signed)
RT assisted with PT intubation- uneventful. Resulted in BBS, positive end tidal, 7.5 ETT secured, and PT placed on vent.

## 2016-09-07 ENCOUNTER — Inpatient Hospital Stay (HOSPITAL_COMMUNITY): Payer: Medicare Other

## 2016-09-07 DIAGNOSIS — J441 Chronic obstructive pulmonary disease with (acute) exacerbation: Secondary | ICD-10-CM

## 2016-09-07 DIAGNOSIS — L8990 Pressure ulcer of unspecified site, unspecified stage: Secondary | ICD-10-CM | POA: Insufficient documentation

## 2016-09-07 DIAGNOSIS — L8996 Pressure-induced deep tissue damage of unspecified site: Secondary | ICD-10-CM | POA: Insufficient documentation

## 2016-09-07 LAB — CBC
HCT: 36.3 % — ABNORMAL LOW (ref 39.0–52.0)
Hemoglobin: 11 g/dL — ABNORMAL LOW (ref 13.0–17.0)
MCH: 26.8 pg (ref 26.0–34.0)
MCHC: 30.3 g/dL (ref 30.0–36.0)
MCV: 88.5 fL (ref 78.0–100.0)
PLATELETS: 459 10*3/uL — AB (ref 150–400)
RBC: 4.1 MIL/uL — AB (ref 4.22–5.81)
RDW: 17.5 % — ABNORMAL HIGH (ref 11.5–15.5)
WBC: 10.4 10*3/uL (ref 4.0–10.5)

## 2016-09-07 LAB — BASIC METABOLIC PANEL
ANION GAP: 10 (ref 5–15)
BUN: 16 mg/dL (ref 6–20)
CHLORIDE: 97 mmol/L — AB (ref 101–111)
CO2: 36 mmol/L — AB (ref 22–32)
Calcium: 8.6 mg/dL — ABNORMAL LOW (ref 8.9–10.3)
Creatinine, Ser: 0.87 mg/dL (ref 0.61–1.24)
GFR calc non Af Amer: >60 mL/min (ref >60)
Glucose, Bld: 88 mg/dL (ref 65–99)
Potassium: 4.5 mmol/L (ref 3.5–5.1)
Sodium: 143 mmol/L (ref 135–145)

## 2016-09-07 LAB — GLUCOSE, CAPILLARY
GLUCOSE-CAPILLARY: 93 mg/dL (ref 65–99)
GLUCOSE-CAPILLARY: 72 mg/dL (ref 65–99)
Glucose-Capillary: 74 mg/dL (ref 65–99)
Glucose-Capillary: 85 mg/dL (ref 65–99)
Glucose-Capillary: 100 mg/dL — ABNORMAL HIGH (ref 65–99)

## 2016-09-07 MED ORDER — HYDRALAZINE HCL 25 MG PO TABS
25.0000 mg | ORAL_TABLET | Freq: Three times a day (TID) | ORAL | Status: DC
  Administered 2016-09-07 – 2016-09-10 (×10): 25 mg
  Filled 2016-09-07 (×10): qty 1

## 2016-09-07 MED ORDER — JEVITY 1.2 CAL PO LIQD
1000.0000 mL | ORAL | Status: DC
  Administered 2016-09-07: 1000 mL

## 2016-09-07 MED ORDER — HYDROCORTISONE 1 % EX CREA
TOPICAL_CREAM | Freq: Two times a day (BID) | CUTANEOUS | Status: DC
  Administered 2016-09-07: 22:00:00 via TOPICAL
  Administered 2016-09-07: 1 via TOPICAL
  Administered 2016-09-08 – 2016-09-10 (×4): via TOPICAL
  Administered 2016-09-11: 1 via TOPICAL
  Administered 2016-09-11 – 2016-09-15 (×6): via TOPICAL
  Filled 2016-09-07: qty 28

## 2016-09-07 MED ORDER — ROSUVASTATIN CALCIUM 20 MG PO TABS
20.0000 mg | ORAL_TABLET | Freq: Every day | ORAL | Status: DC
  Administered 2016-09-07 – 2016-09-10 (×4): 20 mg
  Filled 2016-09-07 (×4): qty 1

## 2016-09-07 MED ORDER — IPRATROPIUM-ALBUTEROL 0.5-2.5 (3) MG/3ML IN SOLN
3.0000 mL | Freq: Four times a day (QID) | RESPIRATORY_TRACT | Status: DC
  Administered 2016-09-07 – 2016-09-14 (×26): 3 mL via RESPIRATORY_TRACT
  Filled 2016-09-07 (×31): qty 3

## 2016-09-07 MED ORDER — CARVEDILOL 12.5 MG PO TABS
12.5000 mg | ORAL_TABLET | Freq: Two times a day (BID) | ORAL | Status: DC
  Administered 2016-09-07 – 2016-09-09 (×4): 12.5 mg
  Filled 2016-09-07 (×4): qty 1

## 2016-09-07 MED ORDER — RIVAROXABAN 20 MG PO TABS
20.0000 mg | ORAL_TABLET | Freq: Every day | ORAL | Status: DC
  Administered 2016-09-07 – 2016-09-15 (×9): 20 mg
  Filled 2016-09-07 (×9): qty 1

## 2016-09-07 MED ORDER — SUCRALFATE 1 GM/10ML PO SUSP
1.0000 g | Freq: Four times a day (QID) | ORAL | Status: DC
  Administered 2016-09-07 – 2016-09-10 (×14): 1 g
  Filled 2016-09-07 (×14): qty 10

## 2016-09-07 NOTE — Progress Notes (Signed)
Initial Nutrition Assessment  DOCUMENTATION CODES:   Obesity unspecified  INTERVENTION:   If patient expected to remain intubated for >24-48 hours, recommend nutrition support (PEPUP protocol).  Tube feeding recommendations:  Initiate Vital HP at goal rate of 70 ml/hr. Provides 1680 kcal (74% of needs), 147 g protein (95% of needs) and 1404 ml H2O.  RD to continue to monitor  NUTRITION DIAGNOSIS:   Inadequate oral intake related to inability to eat as evidenced by NPO status.  GOAL:   Provide needs based on ASPEN/SCCM guidelines  MONITOR:   Vent status, Labs, Weight trends, Skin, I & O's  REASON FOR ASSESSMENT:   Malnutrition Screening Tool, Ventilator    ASSESSMENT:   70 yo obese man, hx OSA (not on CPAP), COPD, NICM with systolic CHF, parox A fib, hx prolonged QT-c. He was admitted 11/3 with acute dyspnea in setting B interstitial infiltrates on CXR. He was diuresed. On 11/4 he became lethargic, hypoxemic. ABG with hypercapnia. He moved to the ICu with severe lethargy to start BiPAP.  Some concern for possible CVA given the acute change in MS. He received methadone last pm, did not receive this 11/4. Intubated on 11/4 pm.   Pt was readmitted 11/3 after discharging on 11/2. During that previous admission, pt was nutritionally assessed and provided heart failure nutrition education. Pt was eating well prior to discharge on 11/2, consuming 100% of meals.  Per chart review, pt has lost 11 lb since 11/4, pt is on IV Lasix so suspect fluid loss. Pt with no muscle or fat depletion. Tube feeding recommendations provided above if pt is to remain intubated.  Patient is currently intubated on ventilator support MV: 7.5 L/min Temp (24hrs), Avg:99.1 F (37.3 C), Min:98.9 F (37.2 C), Max:99.3 F (37.4 C)  Labs reviewed. Medications: IV Lasix every 8 hours   Diet Order:  Diet NPO time specified  Skin:  Wound (see comment) (Stage III heel pressure injury)  Last BM:   11/2  Height:   Ht Readings from Last 1 Encounters:  09/06/16 6' (1.829 m)    Weight:   Wt Readings from Last 1 Encounters:  09/07/16 278 lb 10.6 oz (126.4 kg)    Ideal Body Weight:  80.9 kg  BMI:  Body mass index is 37.79 kg/m.  Estimated Nutritional Needs:   Kcal:  1390-1770  Protein:  155-165g  Fluid:  1.5-1.7L/day  EDUCATION NEEDS:   No education needs identified at this time  Clayton Bibles, MS, RD, LDN Pager: [phone] After Hours Pager: [phone]

## 2016-09-07 NOTE — Progress Notes (Addendum)
PULMONARY / CRITICAL CARE MEDICINE   Name: Nicholas Caldwell MRN: [id number] DOB: 1946/06/10    ADMISSION DATE:  09/05/2016 CONSULTATION DATE:  09/06/16  REFERRING MD:  Dr Charlies Silvers  CHIEF COMPLAINT:  Lethargy, AMS  HISTORY OF PRESENT ILLNESS:   70 yo obese man, hx OSA (not on CPAP), COPD, NICM with systolic CHF, parox A fib, hx prolonged QT-c. He was admitted 11/3 with acute dyspnea in setting B interstitial infiltrates on CXR. He was diuresed. On 11/4 he became lethargic, hypoxemic. ABG with hypercapnia. He moved to the ICu with severe lethargy to start BiPAP.  Some concern for possible CVA given the acute change in MS. He received methadone last pm, did not receive this 11/4. Intubated on 11/4 pm.   SUBJECTIVE:  Pt wide awake now Was intubated last pm, MRI Brain was considered but never done.   VITAL SIGNS: BP 112/72 (BP Location: Right Arm)   Pulse (!) 132   Temp 99.3 F (37.4 C) (Axillary)   Resp 14   Ht 6' (1.829 m)   Wt 126.4 kg (278 lb 10.6 oz)   SpO2 95%   BMI 37.79 kg/m   HEMODYNAMICS:    VENTILATOR SETTINGS: Vent Mode: PRVC FiO2 (%):  [30 %-100 %] 40 % Set Rate:  [14 bmp] 14 bmp Vt Set:  [500 mL-620 mL] 620 mL PEEP:  [5 cmH20-6 cmH20] 5 cmH20 Plateau Pressure:  [21 cmH20-26 cmH20] 26 cmH20  INTAKE / OUTPUT: I/O last 3 completed shifts: In: 357 [I.V.:237; NG/GT:120] Out: 4180 [Urine:3580; Emesis/NG output:600]  PHYSICAL EXAMINATION: General:  Obese man, wakes on fentanyl, intubated Neuro:  Wide awake and follows commands.  HEENT:  PERRL, oral secretions.  Cardiovascular:  Regular, no M Lungs:  B insp rhonchi and crackles, no wheeze Abdomen:  Non-tender Musculoskeletal:  No edema Skin:  No rash  LABS:  BMET  Recent Labs Lab 09/05/16 1936 09/06/16 0335 09/07/16 0337  NA 138 139 143  K 4.2 4.1 4.5  CL 102 100* 97*  CO2 30 34* 36*  BUN _0 CREATININE 0.81 0.77 0.87  GLUCOSE 129* 140* 88    Electrolytes  Recent Labs Lab 09/05/16 1936  09/06/16 0335 09/07/16 0337  CALCIUM 8.5* 8.4* 8.6*    CBC  Recent Labs Lab 09/05/16 1936 09/06/16 2245 09/07/16 0337  WBC 8.7 10.9* 10.4  HGB 11.5* 10.7* 11.0*  HCT 37.6* 34.7* 36.3*  PLT 428* 424* 459*    Coag's  Recent Labs Lab 09/05/16 1936  INR 1.14    Sepsis Markers No results for input(s): LATICACIDVEN, PROCALCITON, O2SATVEN in the last 168 hours.  ABG  Recent Labs Lab 09/06/16 1614 09/06/16 1959 09/06/16 2251  PHART 7.287* 7.265* 7.397  PCO2ART 82.3* 88.4* 62.9*  PO2ART 62.5* 132* 87.2    Liver Enzymes No results for input(s): AST, ALT, ALKPHOS, BILITOT, ALBUMIN in the last 168 hours.  Cardiac Enzymes  Recent Labs Lab 09/05/16 2311 09/06/16 0335 09/06/16 0951  TROPONINI 0.06* 0.06* 0.04*    Glucose  Recent Labs Lab 09/06/16 1124 09/06/16 1557 09/06/16 2019 09/06/16 2304 09/07/16 0414 09/07/16 0804  GLUCAP 164* 99 93 83 93 74    Imaging Dg Chest Port 1 View  Result Date: 09/07/2016 CLINICAL DATA:  Acute respiratory failure EXAM: PORTABLE CHEST 1 VIEW COMPARISON:  09/06/2016 FINDINGS: Cardiac shadow is again enlarged. An endotracheal tube and nasogastric catheter are again noted in satisfactory position. Diffuse vascular congestion is noted. Right-sided pleural effusion is noted. No focal confluent infiltrate is seen  although likely right basilar atelectasis is present. No bony abnormality is noted. IMPRESSION: Congestive failure and right pleural effusion. Electronically Signed   By: Inez Catalina M.D.   On: 09/07/2016 07:18   Dg Chest Port 1 View  Result Date: 09/06/2016 CLINICAL DATA:  Initial evaluation for endotracheal tube placement. EXAM: PORTABLE CHEST 1 VIEW COMPARISON:  Prior radiograph from earlier the same day. FINDINGS: Study is limited as the patient is markedly rotated to the right. Patient is now intubated with an endotracheal tube in place. Tip of the tube is positioned approximately 2.5 cm above the carina. Enteric tube  courses in the the abdomen. Cardiomegaly grossly stable. Changes related congestive heart failure again noted, relatively stable. No pneumothorax. Osseous structures unchanged. IMPRESSION: 1. Tip of the endotracheal tube approximately 2.5 cm above the carina. 2. Little interval change in appearance of the chest with cardiomegaly and findings consistent with congestive heart failure. Electronically Signed   By: Jeannine Boga M.D.   On: 09/06/2016 18:19   Dg Chest Port 1 View  Result Date: 09/06/2016 CLINICAL DATA:  Compromised respiration EXAM: PORTABLE CHEST 1 VIEW COMPARISON:  Chest radiograph from one day prior. FINDINGS: Stable cardiomediastinal silhouette with cardiomegaly. No pneumothorax. Stable small right pleural effusion. Moderate pulmonary edema appears slightly worsened. Bibasilar lung opacities appear stable. IMPRESSION: 1. Moderate congestive heart failure, slightly worsened. 2. Stable small right pleural effusion. 3. Stable bibasilar lung opacities, favor atelectasis. Electronically Signed   By: Ilona Sorrel M.D.   On: 09/06/2016 13:02   Dg Abd Portable 1v  Result Date: 09/06/2016 CLINICAL DATA:  NG tube placement. EXAM: PORTABLE ABDOMEN - 1 VIEW COMPARISON:  Itch chest radiograph of earlier today. FINDINGS: Nasogastric terminates at the body of the stomach. Side port is not well visualized. No gross bowel obstruction Cardiomegaly. IMPRESSION: Nasogastric at the body of the stomach. Consider minimal of enhancement. Electronically Signed   By: Abigail Miyamoto M.D.   On: 09/06/2016 18:18     STUDIES:   CULTURES:  ANTIBIOTICS:  SIGNIFICANT EVENTS: Acute altered MS 11/4, started BiPAP Intubated 11/4 pm  LINES/TUBES: ETT 11/4 >>    DISCUSSION: 70 yo man, hx COPD, systolic CHF, COPD, untreated OSA. Admitted with acute resp failure and suspected sCHF exacerbation. Experienced lethargy and declining MS 11/4. Started on BiPAP.   ASSESSMENT / PLAN:  PULMONARY A: Acute  hypoxemic and hypercapneic respiratory failure OSA COPD  Without apparent exacerbation.  Cardiogenic pulmonary edema P:   Intubated for hypercapnic resp failure, also with B pulm edema which will affect ability to extubate BD's as ordered Follow CXR for improvement pulm edema   CARDIOVASCULAR A:  HTN NICM Acute on chronic systolic CHF exacerbation Hyperlipidemia  P:  Diuresis as able to tolerate > lasix 3m q8h Coreg, digoxin, hydralazine, Imdur, crestor  RENAL A:   No acute issues P:   Diuresis as renal fxn and BP tolerate Follow BMP on lasix  GASTROINTESTINAL A:   SUP  P:   carafate (avoiding meds that will affect QT-c)  HEMATOLOGIC A:   No acute issues P:   INFECTIOUS A:   No evidence acute infection P:   Follow fever curve, WBC   ENDOCRINE A:   DM   P:   Hold glipizide Continue levemir  SSI  NEUROLOGIC A:   Lethargy and AMS. Suspect that this was due to hypercapnia. Much improved 11/5 P:   RASS goal: 0 to -1 Follow MS, minimize sedation as able Will defer MRI brain given rapid  improvement after adequate ventilation.    FAMILY  - Updates: No family available at bedside.   - Inter-disciplinary family meet or Palliative Care meeting due by:  09/13/16  Independent CC time 32 minutes   Baltazar Apo, MD, PhD 09/07/2016, 9:07 AM [facility] [phone] or if no answer [phone]

## 2016-09-07 NOTE — Progress Notes (Signed)
Per RN Amy, MD placed PT on CPAP / PS.

## 2016-09-07 NOTE — Progress Notes (Signed)
PT Cancellation Note  Patient Details Name: Tenzin Edelman MRN: [id number] DOB: 11-Jul-1946   Cancelled Treatment:    Reason Eval/Treat Not Completed: Medical issues which prohibited therapy   Hamdi Kley,KATHrine E 09/07/2016, 8:34 AM Carmelia Bake, PT, DPT 09/07/2016 Pager: [phone]

## 2016-09-08 ENCOUNTER — Inpatient Hospital Stay (HOSPITAL_COMMUNITY): Payer: Medicare Other

## 2016-09-08 LAB — GLUCOSE, CAPILLARY
GLUCOSE-CAPILLARY: 152 mg/dL — AB (ref 65–99)
GLUCOSE-CAPILLARY: 166 mg/dL — AB (ref 65–99)
GLUCOSE-CAPILLARY: 94 mg/dL (ref 65–99)
Glucose-Capillary: 95 mg/dL (ref 65–99)
Glucose-Capillary: 121 mg/dL — ABNORMAL HIGH (ref 65–99)
Glucose-Capillary: 109 mg/dL — ABNORMAL HIGH (ref 65–99)
Glucose-Capillary: 107 mg/dL — ABNORMAL HIGH (ref 65–99)

## 2016-09-08 LAB — CBC
HCT: 34.9 % — ABNORMAL LOW (ref 39.0–52.0)
Hemoglobin: 10.7 g/dL — ABNORMAL LOW (ref 13.0–17.0)
MCH: 26.4 pg (ref 26.0–34.0)
MCHC: 30.7 g/dL (ref 30.0–36.0)
MCV: 86.2 fL (ref 78.0–100.0)
PLATELETS: 425 10*3/uL — AB (ref 150–400)
RBC: 4.05 MIL/uL — ABNORMAL LOW (ref 4.22–5.81)
RDW: 17.3 % — AB (ref 11.5–15.5)
WBC: 9.3 10*3/uL (ref 4.0–10.5)

## 2016-09-08 LAB — BASIC METABOLIC PANEL
Anion gap: 11 (ref 5–15)
Anion gap: 6 (ref 5–15)
BUN: 15 mg/dL (ref 6–20)
BUN: 12 mg/dL (ref 6–20)
CALCIUM: 8 mg/dL — AB (ref 8.9–10.3)
CHLORIDE: 94 mmol/L — AB (ref 101–111)
CO2: 38 mmol/L — AB (ref 22–32)
CO2: 42 mmol/L — AB (ref 22–32)
CREATININE: 0.8 mg/dL (ref 0.61–1.24)
CREATININE: 0.81 mg/dL (ref 0.61–1.24)
Calcium: 8.1 mg/dL — ABNORMAL LOW (ref 8.9–10.3)
Chloride: 95 mmol/L — ABNORMAL LOW (ref 101–111)
GFR calc Af Amer: >60 mL/min (ref >60)
GFR calc Af Amer: >60 mL/min (ref >60)
GFR calc non Af Amer: >60 mL/min (ref >60)
GFR calc non Af Amer: >60 mL/min (ref >60)
GLUCOSE: 113 mg/dL — AB (ref 65–99)
Glucose, Bld: 98 mg/dL (ref 65–99)
Potassium: 2.9 mmol/L — ABNORMAL LOW (ref 3.5–5.1)
Potassium: 3.1 mmol/L — ABNORMAL LOW (ref 3.5–5.1)
SODIUM: 143 mmol/L (ref 135–145)
Sodium: 143 mmol/L (ref 135–145)

## 2016-09-08 MED ORDER — POTASSIUM CHLORIDE 20 MEQ/15ML (10%) PO SOLN
30.0000 meq | ORAL | Status: AC
  Administered 2016-09-08: 30 meq
  Administered 2016-09-08: 09:00:00
  Filled 2016-09-08 (×2): qty 30

## 2016-09-08 MED ORDER — INSULIN ASPART 100 UNIT/ML ~~LOC~~ SOLN
0.0000 [IU] | SUBCUTANEOUS | Status: DC
  Administered 2016-09-08 – 2016-09-09 (×4): 2 [IU] via SUBCUTANEOUS
  Administered 2016-09-09: 3 [IU] via SUBCUTANEOUS
  Administered 2016-09-09: 1 [IU] via SUBCUTANEOUS
  Administered 2016-09-09 (×2): 3 [IU] via SUBCUTANEOUS
  Administered 2016-09-10 (×2): 2 [IU] via SUBCUTANEOUS
  Administered 2016-09-10: 1 [IU] via SUBCUTANEOUS

## 2016-09-08 MED ORDER — METOPROLOL TARTRATE 5 MG/5ML IV SOLN
5.0000 mg | Freq: Four times a day (QID) | INTRAVENOUS | Status: DC | PRN
  Administered 2016-09-08: 5 mg via INTRAVENOUS
  Filled 2016-09-08: qty 5

## 2016-09-08 MED ORDER — MORPHINE SULFATE (PF) 2 MG/ML IV SOLN
1.0000 mg | INTRAVENOUS | Status: DC | PRN
  Administered 2016-09-08 – 2016-09-09 (×9): 2 mg via INTRAVENOUS
  Filled 2016-09-08 (×9): qty 1

## 2016-09-08 NOTE — Progress Notes (Signed)
Date:  September 08, 2016 Chart reviewed for concurrent status and case management needs. Will continue to follow the patient for status change: respiratory failure and full vent support Discharge Planning: following for needs Expected discharge date: [id number] Krisa Blattner, BSN, [place], [place]

## 2016-09-08 NOTE — Progress Notes (Signed)
PT Cancellation Note  Patient Details Name: Nicholas Caldwell MRN: [id number] DOB: [birth year]   Cancelled Treatment:    Reason Eval/Treat Not Completed: Medical issues which prohibited therapy. Pt currently on vent. Will hold PT for now.    Weston Anna, MPT Pager: [phone]

## 2016-09-08 NOTE — Procedures (Signed)
Extubation Procedure Note  Patient Details:   Name: Conard Alvira DOB: 05-22-1946 MRN: [id number]   Airway Documentation:     Evaluation  O2 sats: stable throughout Complications: No apparent complications Patient did tolerate procedure well. Bilateral Breath Sounds: Diminished   Yes   Patient extubated to BiPAP per MD order. Patient tolerated extubation without complication. RT will continue to monitor patient.   Lamonte Sakai 09/08/2016, 9:10 AM

## 2016-09-08 NOTE — Progress Notes (Signed)
Patient stated he was not ready for BIPAP at this time.

## 2016-09-08 NOTE — Progress Notes (Signed)
OT Cancellation Note  Patient Details Name: Antwian Santaana MRN: [id number] DOB: 01/24/1946   Cancelled Treatment:    Reason Eval/Treat Not Completed: Patient not medically ready    Medical issues which prohibited therapy. Pt currently on vent.  Mickel Baas [place], [place] 09/08/2016, 12:36 PM

## 2016-09-08 NOTE — Progress Notes (Signed)
[place] Progress Note Patient Name: Nicholas Caldwell DOB: [birth year] MRN: [id number]   Date of Service  09/08/2016  HPI/Events of Note  Multiple issues: 1. Extubated earlier today and is able to handel sips of water without issues and 2. Patient on Levimir and [facility] Novolog SSI. Has only received 1 unit Novolog [place] now.   eICU Interventions  Will order: 1. Carb modified clear liquid diet.  2. D/C Levimir. 3. Will change to Q 4 hour sensitive Novolog SSI for tonight.      Intervention Category Major Interventions: Hyperglycemia - active titration of insulin therapy  Lysle Dingwall 09/08/2016, 5:43 PM

## 2016-09-08 NOTE — Progress Notes (Signed)
PULMONARY / CRITICAL CARE MEDICINE   Name: Nicholas Caldwell MRN: [id number] DOB: 01/06/1946    ADMISSION DATE:  09/05/2016 CONSULTATION DATE:  09/06/16  REFERRING MD:  Dr Charlies Silvers  CHIEF COMPLAINT:  Lethargy, AMS  HISTORY OF PRESENT ILLNESS:   70 yo obese man, hx OSA (not on CPAP), COPD, NICM with systolic CHF, parox A fib, hx prolonged QT-c. He was admitted 11/3 with acute dyspnea in setting B interstitial infiltrates on CXR. He was diuresed. On 11/4 he became lethargic, hypoxemic. ABG with hypercapnia. He moved to the ICu with severe lethargy to start BiPAP.  Some concern for possible CVA given the acute change in MS. He received methadone last pm, did not receive this 11/4. Intubated on 11/4 pm.   SUBJECTIVE:  Diuresed well with lasix Denies pain Afebrile Tachy Weaning well on PS 5/5  VITAL SIGNS: BP 108/64   Pulse (!) 108   Temp 98.9 F (37.2 C) (Oral)   Resp 14   Ht 6' (1.829 m)   Wt 280 lb 13.9 oz (127.4 kg)   SpO2 95%   BMI 38.09 kg/m   HEMODYNAMICS:    VENTILATOR SETTINGS: Vent Mode: PRVC FiO2 (%):  [40 %] 40 % Set Rate:  [14 bmp] 14 bmp Vt Set:  [620 mL] 620 mL PEEP:  [5 cmH20] 5 cmH20 Pressure Support:  [10 cmH20] 10 cmH20 Plateau Pressure:  [23 cmH20-26 cmH20] 23 cmH20  INTAKE / OUTPUT: I/O last 3 completed shifts: In: 1201.9 [I.V.:723.3; NG/GT:478.7] Out: 5780 [Urine:5080; Emesis/NG output:700]  PHYSICAL EXAMINATION: General:  Obese man, awake on fentanyl, intubated Neuro:  Wide awake and follows commands, non focal.  HEENT:  PERRL, oral secretions.  Cardiovascular:  Regular, no M Lungs:  B scattered crackles, no wheeze Abdomen:  Non-tender Musculoskeletal:  No edema Skin:  No rash  LABS:  BMET  Recent Labs Lab 09/06/16 0335 09/07/16 0337 09/08/16 0324  NA 139 143 143  K 4.1 4.5 2.9*  CL 100* 97* 94*  CO2 34* 36* 38*  BUN _0 CREATININE 0.77 0.87 0.80  GLUCOSE 140* 88 98    Electrolytes  Recent Labs Lab 09/06/16 0335  09/07/16 0337 09/08/16 0324  CALCIUM 8.4* 8.6* 8.1*    CBC  Recent Labs Lab 09/06/16 2245 09/07/16 0337 09/08/16 0324  WBC 10.9* 10.4 9.3  HGB 10.7* 11.0* 10.7*  HCT 34.7* 36.3* 34.9*  PLT 424* 459* 425*    Coag's  Recent Labs Lab 09/05/16 1936  INR 1.14    Sepsis Markers No results for input(s): LATICACIDVEN, PROCALCITON, O2SATVEN in the last 168 hours.  ABG  Recent Labs Lab 09/06/16 1614 09/06/16 1959 09/06/16 2251  PHART 7.287* 7.265* 7.397  PCO2ART 82.3* 88.4* 62.9*  PO2ART 62.5* 132* 87.2    Liver Enzymes No results for input(s): AST, ALT, ALKPHOS, BILITOT, ALBUMIN in the last 168 hours.  Cardiac Enzymes  Recent Labs Lab 09/05/16 2311 09/06/16 0335 09/06/16 0951  TROPONINI 0.06* 0.06* 0.04*    Glucose  Recent Labs Lab 09/07/16 0804 09/07/16 1205 09/07/16 1551 09/07/16 1939 09/08/16 0330 09/08/16 0727  GLUCAP 74 72 85 100* 95 121*    Imaging Dg Chest Port 1 View  Result Date: 09/08/2016 CLINICAL DATA:  Respiratory failure. EXAM: PORTABLE CHEST 1 VIEW COMPARISON:  09/07/2016. FINDINGS: Endotracheal tube and NG tube in stable position. Cardiomegaly with diffuse bilateral pulmonary infiltrates and small pleural effusions noted. Findings consistent with congestive heart failure. Similar findings noted on prior exam. IMPRESSION: 1. Lines and tubes in  stable position. 2. Persistent congestive heart failure with bilateral pulmonary edema and bilateral effusions. No significant change. Electronically Signed   By: Marcello Moores  Register   On: 09/08/2016 07:01     STUDIES:  Echo 11/4 EF 25%, RVSP 54  CULTURES:  ANTIBIOTICS:  SIGNIFICANT EVENTS: Acute altered MS 11/4, started BiPAP Intubated 11/4 pm  LINES/TUBES: ETT 11/4 >>    DISCUSSION: 70 yo man, hx COPD, systolic CHF, COPD, untreated OSA. Admitted with acute resp failure and suspected sCHF exacerbation. Experienced lethargy and declining MS 11/4 -intubated. Non focal exam hence MRI  cancelled Now weaning well after diuresis  ASSESSMENT / PLAN:  PULMONARY A: Acute hypoxemic and hypercapneic respiratory failure OSA COPD  Without apparent exacerbation.  Acute Cardiogenic pulmonary edema P:   SBTs with goal extubation to bipap, weaning well BD's as ordered    CARDIOVASCULAR A:  HTN NICM Acute on chronic systolic CHF exacerbation Hyperlipidemia  Sinus tachy P:  Lasix 40m q8 Coreg, digoxin, hydralazine, Imdur, crestor Titrate coreg to home dose 25 bid, metoprolol prn  RENAL A:   Hypokalemia P:   Diuresis as renal fxn and BP tolerate Follow BMP twice daily on lasix  GASTROINTESTINAL A:   SUP  P:   carafate (avoiding meds that will affect QT-c)  HEMATOLOGIC A:   No acute issues P:   INFECTIOUS A:   No evidence acute infection P:   Follow fever curve, WBC   ENDOCRINE A:   DM   P:   Hold glipizide Continue levemir  SSI  NEUROLOGIC A:   Lethargy and AMS. Suspect that this was due to hypercapnia. Much improved 11/5,  hence defer MRI brain P:   RASS goal: 0 Hold home methadone (qT prolongation)    FAMILY  - Updates: No family available at bedside.   - Inter-disciplinary family meet or Palliative Care meeting due by:  09/13/16  Independent CC time 322minutes   RKara MeadMD. FCCP. [facility] Pager [phone] no response call [phone]     09/08/2016, 8:14 AM

## 2016-09-08 NOTE — Progress Notes (Signed)
[place] Progress Note Patient Name: Nicholas Caldwell DOB: 1946-06-16 MRN: [id number]   Date of Service  09/08/2016  HPI/Events of Note  Potassium 2.9. Peripheral and central access. Creatinine 0.8. Currently undergoing diuresis with Lasix IV every 8 hour.   eICU Interventions  1. KCl 30 mEq via tube 2 2. BMP at 1400 hrs. today      Intervention Category Intermediate Interventions: Electrolyte abnormality - evaluation and management  Tera Partridge 09/08/2016, 5:34 AM

## 2016-09-09 ENCOUNTER — Inpatient Hospital Stay (HOSPITAL_COMMUNITY): Payer: Medicare Other

## 2016-09-09 DIAGNOSIS — I480 Paroxysmal atrial fibrillation: Secondary | ICD-10-CM

## 2016-09-09 LAB — CBC
HEMATOCRIT: 34.3 % — AB (ref 39.0–52.0)
HEMOGLOBIN: 10.7 g/dL — AB (ref 13.0–17.0)
MCH: 26.6 pg (ref 26.0–34.0)
MCHC: 31.2 g/dL (ref 30.0–36.0)
MCV: 85.3 fL (ref 78.0–100.0)
Platelets: 393 10*3/uL (ref 150–400)
RBC: 4.02 MIL/uL — AB (ref 4.22–5.81)
RDW: 17.1 % — ABNORMAL HIGH (ref 11.5–15.5)
WBC: 8.8 10*3/uL (ref 4.0–10.5)

## 2016-09-09 LAB — BASIC METABOLIC PANEL
ANION GAP: 11 (ref 5–15)
BUN: 12 mg/dL (ref 6–20)
CHLORIDE: 92 mmol/L — AB (ref 101–111)
CO2: 39 mmol/L — AB (ref 22–32)
Calcium: 8 mg/dL — ABNORMAL LOW (ref 8.9–10.3)
Creatinine, Ser: 0.73 mg/dL (ref 0.61–1.24)
GFR calc non Af Amer: >60 mL/min (ref >60)
Glucose, Bld: 162 mg/dL — ABNORMAL HIGH (ref 65–99)
POTASSIUM: 3.1 mmol/L — AB (ref 3.5–5.1)
Sodium: 142 mmol/L (ref 135–145)

## 2016-09-09 LAB — GLUCOSE, CAPILLARY
GLUCOSE-CAPILLARY: 146 mg/dL — AB (ref 65–99)
GLUCOSE-CAPILLARY: 194 mg/dL — AB (ref 65–99)
GLUCOSE-CAPILLARY: 214 mg/dL — AB (ref 65–99)
Glucose-Capillary: 183 mg/dL — ABNORMAL HIGH (ref 65–99)
Glucose-Capillary: 236 mg/dL — ABNORMAL HIGH (ref 65–99)
Glucose-Capillary: 240 mg/dL — ABNORMAL HIGH (ref 65–99)

## 2016-09-09 LAB — MAGNESIUM: Magnesium: 1.6 mg/dL — ABNORMAL LOW (ref 1.7–2.4)

## 2016-09-09 LAB — PHOSPHORUS: Phosphorus: 3.9 mg/dL (ref 2.5–4.6)

## 2016-09-09 MED ORDER — INSULIN DETEMIR 100 UNIT/ML ~~LOC~~ SOLN
20.0000 [IU] | Freq: Every evening | SUBCUTANEOUS | Status: DC
  Administered 2016-09-09 – 2016-09-10 (×2): 20 [IU] via SUBCUTANEOUS
  Filled 2016-09-09 (×2): qty 0.2

## 2016-09-09 MED ORDER — METHADONE HCL 10 MG PO TABS
10.0000 mg | ORAL_TABLET | Freq: Two times a day (BID) | ORAL | Status: DC
  Administered 2016-09-09 – 2016-09-16 (×14): 10 mg via ORAL
  Filled 2016-09-09 (×14): qty 1

## 2016-09-09 MED ORDER — MAGNESIUM SULFATE 2 GM/50ML IV SOLN
2.0000 g | Freq: Once | INTRAVENOUS | Status: AC
  Administered 2016-09-09: 2 g via INTRAVENOUS
  Filled 2016-09-09: qty 50

## 2016-09-09 MED ORDER — FUROSEMIDE 10 MG/ML IJ SOLN
60.0000 mg | Freq: Two times a day (BID) | INTRAMUSCULAR | Status: DC
  Administered 2016-09-09 – 2016-09-10 (×2): 60 mg via INTRAVENOUS
  Filled 2016-09-09 (×2): qty 6

## 2016-09-09 MED ORDER — POTASSIUM CHLORIDE 20 MEQ/15ML (10%) PO SOLN
40.0000 meq | Freq: Two times a day (BID) | ORAL | Status: DC
  Administered 2016-09-09 (×2): 40 meq via ORAL
  Filled 2016-09-09 (×2): qty 30

## 2016-09-09 MED ORDER — CARVEDILOL 25 MG PO TABS
25.0000 mg | ORAL_TABLET | Freq: Two times a day (BID) | ORAL | Status: DC
  Administered 2016-09-09 – 2016-09-10 (×3): 25 mg
  Filled 2016-09-09: qty 2
  Filled 2016-09-09: qty 1
  Filled 2016-09-09: qty 2

## 2016-09-09 MED ORDER — PREMIER PROTEIN SHAKE
11.0000 [oz_av] | ORAL | Status: DC
  Administered 2016-09-09 – 2016-09-12 (×3): 11 [oz_av] via ORAL
  Filled 2016-09-09 (×3): qty 325.31

## 2016-09-09 NOTE — Progress Notes (Signed)
PULMONARY / CRITICAL CARE MEDICINE   Name: Nicholas Caldwell MRN: [id number] DOB: Jul 22, 1946    ADMISSION DATE:  09/05/2016 CONSULTATION DATE:  09/06/16  REFERRING MD:  Dr Charlies Silvers  CHIEF COMPLAINT:  Lethargy, AMS  HISTORY OF PRESENT ILLNESS:   70 yo obese man, hx OSA (not on CPAP), COPD, NICM with systolic CHF, parox A fib, hx prolonged QT-c. He was admitted 11/3 with acute dyspnea in setting B interstitial infiltrates on CXR. He was diuresed. On 11/4 he became lethargic, hypoxemic. ABG with hypercapnia. He moved to the ICu with severe lethargy to start BiPAP.  Some concern for possible CVA given the acute change in MS. He received methadone last pm, did not receive this 11/4. Intubated on 11/4 pm.   SUBJECTIVE:  Extubated 11/6, doing well, used bipap overnight Diuresed well with lasix Denies pain Afebrile Remains Tachy   VITAL SIGNS: BP (!) 104/59   Pulse (!) 128   Temp 98.7 F (37.1 C) (Oral)   Resp (!) 21   Ht 6' (1.829 m)   Wt 278 lb 10.6 oz (126.4 kg)   SpO2 98%   BMI 37.79 kg/m   HEMODYNAMICS:    VENTILATOR SETTINGS: Vent Mode: BIPAP FiO2 (%):  [30 %] 30 % Set Rate:  [10 bmp] 10 bmp PEEP:  [5 cmH20] 5 cmH20  INTAKE / OUTPUT: I/O last 3 completed shifts: In: 620.7 [I.V.:330.3; NG/GT:290.3] Out: 5425 [Urine:4200; Emesis/NG output:1225]  PHYSICAL EXAMINATION: General:  Obese man,chr ill  Neuro:  awake , alert, non focal.  HEENT:  PERRL, oral secretions.  Cardiovascular:  Regular, no M Lungs:  B scattered crackles, no wheeze Abdomen:  Non-tender Musculoskeletal:  No edema Skin:  No rash  LABS:  BMET  Recent Labs Lab 09/08/16 0324 09/08/16 1422 09/09/16 0310  NA 143 143 142  K 2.9* 3.1* 3.1*  CL 94* 95* 92*  CO2 38* 42* 39*  BUN _0 CREATININE 0.80 0.81 0.73  GLUCOSE 98 113* 162*    Electrolytes  Recent Labs Lab 09/08/16 0324 09/08/16 1422 09/09/16 0310  CALCIUM 8.1* 8.0* 8.0*  MG  --   --  1.6*  PHOS  --   --  3.9     CBC  Recent Labs Lab 09/07/16 0337 09/08/16 0324 09/09/16 0310  WBC 10.4 9.3 8.8  HGB 11.0* 10.7* 10.7*  HCT 36.3* 34.9* 34.3*  PLT 459* 425* 393    Coag's  Recent Labs Lab 09/05/16 1936  INR 1.14    Sepsis Markers No results for input(s): LATICACIDVEN, PROCALCITON, O2SATVEN in the last 168 hours.  ABG  Recent Labs Lab 09/06/16 1614 09/06/16 1959 09/06/16 2251  PHART 7.287* 7.265* 7.397  PCO2ART 82.3* 88.4* 62.9*  PO2ART 62.5* 132* 87.2    Liver Enzymes No results for input(s): AST, ALT, ALKPHOS, BILITOT, ALBUMIN in the last 168 hours.  Cardiac Enzymes  Recent Labs Lab 09/05/16 2311 09/06/16 0335 09/06/16 0951  TROPONINI 0.06* 0.06* 0.04*    Glucose  Recent Labs Lab 09/08/16 0727 09/08/16 1137 09/08/16 1608 09/08/16 1955 09/08/16 2335 09/09/16 0418  GLUCAP 121* 109* 107* 166* 152* 146*    Imaging No results found.   STUDIES:  Echo 11/4 EF 25%, RVSP 54  CULTURES:  ANTIBIOTICS:  SIGNIFICANT EVENTS: Acute altered MS 11/4, started BiPAP Intubated 11/4 pm  LINES/TUBES: ETT 11/4 >> 11/6   DISCUSSION: 70 yo man, hx COPD, systolic CHF, COPD, untreated OSA. Admitted with acute resp failure and suspected sCHF exacerbation. Experienced lethargy and declining MS  11/4 -intubated. Non focal exam hence MRI cancelled Now weaning well after diuresis  ASSESSMENT / PLAN:  PULMONARY A: Acute hypoxemic and hypercapneic respiratory failure OSA COPD  Without apparent exacerbation.  Acute Cardiogenic pulmonary edema P:   BiPAP q hs, will need bipap Rx on discharge & PSG as out pt BD's as ordered    CARDIOVASCULAR A:  HTN NICM Acute on chronic systolic CHF exacerbation Hyperlipidemia  Sinus tachy P:  Lasix 7m q12 Coreg, digoxin, hydralazine, Imdur, crestor Increase coreg to home dose 25 bid, metoprolol prn  RENAL A:   Hypokalemia Hypomagnesemia P:   Diuresis as renal fxn and BP tolerate Replete lytes as  needed  GASTROINTESTINAL A:   SUP  P:   carafate (avoiding meds that will affect QT-c)  HEMATOLOGIC A:   No acute issues P:   INFECTIOUS A:   No evidence acute infection P:   Follow fever curve, WBC   ENDOCRINE A:   DM   P:   Hold glipizide Continue levemir (held overnight)  SSI CHO mod diet  NEUROLOGIC A:   Lethargy and AMS. Suspect that this was due to hypercapnia. Much improved 11/5,  hence defer MRI brain P:   resume home methadone (watch for qT prolongation)    FAMILY  - Updates: daughter requesting POA status, also needs rehab- await pT eval, note repeated admits x 4/ last 621m - Inter-disciplinary family meet or Palliative Care meeting due by:  09/13/16  Independent CC time 3111inutes   RaKara MeadD. FCCP. [facility] Pager [phone] no response call [phone]     09/09/2016, 8:47 AM

## 2016-09-09 NOTE — Progress Notes (Signed)
Patient sitting up watching tv stating he was still not ready for BIPAP.

## 2016-09-09 NOTE — Progress Notes (Signed)
Pt stated he was not ready for bipap at this time.  This RT will return around midnight per patient's request.

## 2016-09-09 NOTE — Evaluation (Signed)
Physical Therapy Evaluation Patient Details Name: Nicholas Caldwell MRN: [id number] DOB: 06-27-1946 Today's Date: 09/09/2016   History of Present Illness  70 y.o. male with medical history significant of non-ischemic cardiomyopathy, Chronic Systolic CHF EF 47-65%,  A-fib on Xarelto,Prolonged Q-T interval , HTN, HLD, COPD on 2 L O2 at home PRN,, Tobacco Abuse, DM Type 2 uncontrolled, chronic pain on Methadone who presents with SOB, LE edema.  Clinical Impression  Pt admitted with above diagnosis. Pt currently with functional limitations due to the deficits listed below (see PT Problem List). Pt ambulated 15' with RW, distance limited by nausea. SaO2 85% on RA with walking, 92% on 2L O2 after 2 min rest.  Pt will benefit from skilled PT to increase their independence and safety with mobility to allow discharge to the venue listed below.       Follow Up Recommendations Home health PT    Equipment Recommendations  None recommended by PT    Recommendations for Other Services       Precautions / Restrictions Precautions Precautions: Fall Precaution Comments: monitor O2 Restrictions Weight Bearing Restrictions: No      Mobility  Bed Mobility Overal bed mobility: Needs Assistance Bed Mobility: Sit to Supine       Sit to supine: Min assist   General bed mobility comments: min A for LEs into bed  Transfers Overall transfer level: Needs assistance Equipment used: Rolling walker (2 wheeled) Transfers: Sit to/from Stand Sit to Stand: Mod assist         General transfer comment: assist to rise from commode  Ambulation/Gait Ambulation/Gait assistance: Min guard Ambulation Distance (Feet): 15 Feet Assistive device: Rolling walker (2 wheeled) Gait Pattern/deviations: Decreased step length - right;Decreased step length - left;Step-through pattern;Trunk flexed   Gait velocity interpretation: Below normal speed for age/gender General Gait Details: significant trunk flexion, pt reports  this is due to R hip problems (he needs a THA per pt) and also due to current upset stomach, distance limited by upset stomach, SaO2 85 % on RA with walking  Stairs            Wheelchair Mobility    Modified Rankin (Stroke Patients Only)       Balance Overall balance assessment: Needs assistance   Sitting balance-Leahy Scale: Good       Standing balance-Leahy Scale: Poor                               Pertinent Vitals/Pain Pain Assessment: No/denies pain    Home Living Family/patient expects to be discharged to:: Private residence Living Arrangements: Spouse/significant other Available Help at Discharge: Family Type of Home: House Home Access: Level entry     Home Layout: One level Home Equipment: Environmental consultant - 2 wheels;Cane - single point;Bedside commode;Shower seat Additional Comments: home oxygen as needed    Prior Function Level of Independence: Independent with assistive device(s)         Comments: uses RW prn     Hand Dominance        Extremity/Trunk Assessment   Upper Extremity Assessment: Overall WFL for tasks assessed           Lower Extremity Assessment: Overall WFL for tasks assessed      Cervical / Trunk Assessment: Kyphotic (flexed trunk in standing)  Communication   Communication: No difficulties  Cognition Arousal/Alertness: Awake/alert Behavior During Therapy: WFL for tasks assessed/performed Overall Cognitive Status: Within Functional Limits for tasks  assessed                      General Comments      Exercises     Assessment/Plan    PT Assessment Patient needs continued PT services  PT Problem List Decreased activity tolerance;Cardiopulmonary status limiting activity;Decreased mobility;Decreased balance          PT Treatment Interventions Gait training;Functional mobility training;Therapeutic exercise;Balance training;Therapeutic activities    PT Goals (Current goals can be found in the Care  Plan section)  Acute Rehab PT Goals Patient Stated Goal: to be able to walk PT Goal Formulation: With patient Time For Goal Achievement: 09/23/16 Potential to Achieve Goals: Good    Frequency Min 3X/week   Barriers to discharge        Co-evaluation               End of Session Equipment Utilized During Treatment: Gait belt Activity Tolerance: Treatment limited secondary to medical complications (Comment) (nausea) Patient left: in bed;with call bell/phone within reach;with nursing/sitter in room Nurse Communication: Mobility status         Time: 1443-1500 PT Time Calculation (min) (ACUTE ONLY): 17 min   Charges:   PT Evaluation $PT Eval Low Complexity: 1 Procedure     PT G Codes:        Philomena Doheny 09/09/2016, 3:27 PM [phone]

## 2016-09-09 NOTE — Progress Notes (Signed)
Nutrition Follow-up  DOCUMENTATION CODES:   Obesity unspecified  INTERVENTION:  - Will order Premier Protein once/day, this supplement provides 160 kcal and 30 grams of protein.  - Encourage PO intakes of meals and supplements.  - RD will continue to monitor for additional needs.   NUTRITION DIAGNOSIS:   Increased nutrient needs related to wound healing as evidenced by estimated needs. -revised.  GOAL:   Patient will meet greater than or equal to 90% of their needs -unmet at this time.  MONITOR:   PO intake, Weight trends, Labs, Skin, I & O's  REASON FOR ASSESSMENT:   Malnutrition Screening Tool, Ventilator    ASSESSMENT:   70 yo obese man, hx OSA (not on CPAP), COPD, NICM with systolic CHF, parox A fib, hx prolonged QT-c. He was admitted 11/3 with acute dyspnea in setting B interstitial infiltrates on CXR. He was diuresed. On 11/4 he became lethargic, hypoxemic. ABG with hypercapnia. He moved to the ICu with severe lethargy to start BiPAP.  Some concern for possible CVA given the acute change in MS. He received methadone last pm, did not receive this 11/4. Intubated on 11/4 pm.   11/7 Pt was extubated yesterday at ~0910 and estimated nutrition needs updated at this time. Diet advanced about 3 hours ago from NPO to Carb Modified. Will order Premier Protein once/day to supplement in order to meet estimated protein needs with concern for L heel wound. CBW consistent with weight from 11/5.  Medications reviewed; 60 mg IV Lasix BID, sliding scale Novolog, 20 units Levemir/day, 2 g IV Mg sulfate x1 dose today, PRN IV Zofran, 40 mEq oral KCl BID, 1 g Carafate QID.  Labs reviewed; CBGs: 146 and 194 mg/dL this AM, K: 3.1 mmol/L, Cl: 92 mmol/L, Ca: 8 mg/dL.    11/5 - Pt was readmitted 11/3 after discharging on 11/2. - During that previous admission, pt was nutritionally assessed and provided heart failure nutrition education. - Pt was eating well prior to discharge on 11/2,  consuming 100% of meals.  - Per chart review, pt has lost 11 lb since 11/4, pt is on IV Lasix so suspect fluid loss. - Pt with no muscle or fat depletion. - Tube feeding recommendations provided (Vital High Protein @ 70 mL/hr) if pt is to remain intubated.   Diet Order:  Diet Carb Modified Fluid consistency: Thin; Room service appropriate? Yes  Skin:  Wound (see comment) (Stage 3 L heel pressure injury)  Last BM:  11/5  Height:   Ht Readings from Last 1 Encounters:  09/06/16 6' (1.829 m)    Weight:   Wt Readings from Last 1 Encounters:  09/09/16 278 lb 10.6 oz (126.4 kg)    Ideal Body Weight:  80.9 kg  BMI:  Body mass index is 37.79 kg/m.  Estimated Nutritional Needs:   Kcal:  1900-2150 (15-17 kcal/kg)  Protein:  97-112 grams (1.2-1.4 grams/kg IBW)  Fluid:  >/= 1.7 L/day  EDUCATION NEEDS:   No education needs identified at this time    Jarome Matin, MS, RD, LDN Inpatient Clinical Dietitian Pager # [phone] After hours/weekend pager # [phone]

## 2016-09-10 LAB — GLUCOSE, CAPILLARY
GLUCOSE-CAPILLARY: 174 mg/dL — AB (ref 65–99)
GLUCOSE-CAPILLARY: 179 mg/dL — AB (ref 65–99)
Glucose-Capillary: 137 mg/dL — ABNORMAL HIGH (ref 65–99)
Glucose-Capillary: 195 mg/dL — ABNORMAL HIGH (ref 65–99)
Glucose-Capillary: 200 mg/dL — ABNORMAL HIGH (ref 65–99)
Glucose-Capillary: 239 mg/dL — ABNORMAL HIGH (ref 65–99)

## 2016-09-10 LAB — BASIC METABOLIC PANEL
Anion gap: 10 (ref 5–15)
BUN: 27 mg/dL — AB (ref 6–20)
CHLORIDE: 92 mmol/L — AB (ref 101–111)
CO2: 37 mmol/L — AB (ref 22–32)
Calcium: 8 mg/dL — ABNORMAL LOW (ref 8.9–10.3)
Creatinine, Ser: 1.18 mg/dL (ref 0.61–1.24)
GFR calc Af Amer: >60 mL/min (ref >60)
GFR calc non Af Amer: >60 mL/min (ref >60)
Glucose, Bld: 168 mg/dL — ABNORMAL HIGH (ref 65–99)
Potassium: 3.7 mmol/L (ref 3.5–5.1)
SODIUM: 139 mmol/L (ref 135–145)

## 2016-09-10 MED ORDER — POTASSIUM CHLORIDE CRYS ER 20 MEQ PO TBCR
40.0000 meq | EXTENDED_RELEASE_TABLET | Freq: Two times a day (BID) | ORAL | Status: DC
  Administered 2016-09-10 – 2016-09-16 (×13): 40 meq via ORAL
  Filled 2016-09-10 (×14): qty 2

## 2016-09-10 MED ORDER — FUROSEMIDE 10 MG/ML IJ SOLN
60.0000 mg | Freq: Every day | INTRAMUSCULAR | Status: DC
  Administered 2016-09-11: 60 mg via INTRAVENOUS
  Filled 2016-09-10 (×2): qty 6

## 2016-09-10 MED ORDER — INSULIN ASPART 100 UNIT/ML ~~LOC~~ SOLN
0.0000 [IU] | Freq: Three times a day (TID) | SUBCUTANEOUS | Status: DC
  Administered 2016-09-11: 5 [IU] via SUBCUTANEOUS
  Administered 2016-09-11: 8 [IU] via SUBCUTANEOUS
  Administered 2016-09-11: 5 [IU] via SUBCUTANEOUS
  Administered 2016-09-12: 8 [IU] via SUBCUTANEOUS
  Administered 2016-09-12 (×2): 3 [IU] via SUBCUTANEOUS
  Administered 2016-09-13: 5 [IU] via SUBCUTANEOUS
  Administered 2016-09-13 – 2016-09-14 (×3): 3 [IU] via SUBCUTANEOUS
  Administered 2016-09-14: 11 [IU] via SUBCUTANEOUS
  Administered 2016-09-14: 2 [IU] via SUBCUTANEOUS
  Administered 2016-09-15 (×2): 3 [IU] via SUBCUTANEOUS
  Administered 2016-09-15: 8 [IU] via SUBCUTANEOUS
  Administered 2016-09-16: 3 [IU] via SUBCUTANEOUS

## 2016-09-10 MED ORDER — HYDRALAZINE HCL 25 MG PO TABS
25.0000 mg | ORAL_TABLET | Freq: Three times a day (TID) | ORAL | Status: DC
  Administered 2016-09-10 – 2016-09-12 (×6): 25 mg via ORAL
  Filled 2016-09-10 (×8): qty 1

## 2016-09-10 MED ORDER — CARVEDILOL 25 MG PO TABS
25.0000 mg | ORAL_TABLET | Freq: Two times a day (BID) | ORAL | Status: DC
  Administered 2016-09-11 – 2016-09-16 (×11): 25 mg via ORAL
  Filled 2016-09-10 (×11): qty 1

## 2016-09-10 MED ORDER — ROSUVASTATIN CALCIUM 20 MG PO TABS
20.0000 mg | ORAL_TABLET | Freq: Every day | ORAL | Status: DC
  Administered 2016-09-11 – 2016-09-15 (×5): 20 mg via ORAL
  Filled 2016-09-10 (×5): qty 1

## 2016-09-10 MED ORDER — MORPHINE SULFATE (PF) 2 MG/ML IV SOLN
1.0000 mg | INTRAVENOUS | Status: DC | PRN
Start: 2016-09-10 — End: 2016-09-11
  Administered 2016-09-10 (×2): 2 mg via INTRAVENOUS
  Administered 2016-09-11: 1 mg via INTRAVENOUS
  Filled 2016-09-10 (×3): qty 1

## 2016-09-10 MED ORDER — INSULIN ASPART 100 UNIT/ML ~~LOC~~ SOLN
0.0000 [IU] | Freq: Every day | SUBCUTANEOUS | Status: DC
  Administered 2016-09-11: 3 [IU] via SUBCUTANEOUS
  Administered 2016-09-15: 4 [IU] via SUBCUTANEOUS

## 2016-09-10 MED ORDER — MORPHINE SULFATE (PF) 2 MG/ML IV SOLN: 1.0000 mg | INTRAVENOUS | Status: DC | PRN

## 2016-09-10 NOTE — Progress Notes (Signed)
Nursing referral, reports pt family requesting information on HCPOA.   Chaplain spoke with pt individually.  Provided education around Advance Directives and explored pt's desire to complete HCPOA.  Pt does wish to complete HCPOA.  Requests chaplain follow up after he is moved upstairs to 1438.      This chaplain will follow and complete HCPOA with pt.    [place], [place]

## 2016-09-10 NOTE — Progress Notes (Signed)
Physical Therapy Treatment Patient Details Name: Nicholas Caldwell MRN: [id number] DOB: 08-21-1946 Today's Date: 09/10/2016    History of Present Illness 70 y.o. male with medical history significant of non-ischemic cardiomyopathy, Chronic Systolic CHF EF 49-70%,  A-fib on Xarelto,Prolonged Q-T interval , HTN, HLD, COPD on 2 L O2 at home PRN,, Tobacco Abuse, DM Type 2 uncontrolled, chronic pain on Methadone who presents with SOB, LE edema.    PT Comments    Improved activity tolerance today, pt ambulated 200' with RW, SaO2 91% on 3L , HR 129 max walking.   Follow Up Recommendations  Home health PT     Equipment Recommendations  None recommended by PT    Recommendations for Other Services       Precautions / Restrictions Precautions Precautions: Fall Precaution Comments: monitor O2 Restrictions Weight Bearing Restrictions: No    Mobility  Bed Mobility Overal bed mobility: Modified Independent Bed Mobility: Supine to Sit     Supine to sit: Modified independent (Device/Increase time);HOB elevated     General bed mobility comments: HOB up 60*, used bedrail  Transfers Overall transfer level: Needs assistance Equipment used: Rolling walker (2 wheeled)   Sit to Stand: Min guard;From elevated surface         General transfer comment: min/guard for safety, no physical assist  Ambulation/Gait Ambulation/Gait assistance: Min guard Ambulation Distance (Feet): 200 Feet Assistive device: Rolling walker (2 wheeled) Gait Pattern/deviations: Step-through pattern;Trunk flexed     General Gait Details: steady, no LOB, 1 standing rest break, HR 129 max, SaO2 91% on 3L [place] walking, no dyspnea   Stairs            Wheelchair Mobility    Modified Rankin (Stroke Patients Only)       Balance     Sitting balance-Leahy Scale: Good       Standing balance-Leahy Scale: Poor Standing balance comment: requires single UE support for static standing                     Cognition Arousal/Alertness: Awake/alert Behavior During Therapy: WFL for tasks assessed/performed Overall Cognitive Status: Within Functional Limits for tasks assessed                      Exercises      General Comments        Pertinent Vitals/Pain Pain Assessment: No/denies pain    Home Living                      Prior Function            PT Goals (current goals can now be found in the care plan section) Acute Rehab PT Goals Patient Stated Goal: to be able to walk, likes to watch football on tv PT Goal Formulation: With patient Time For Goal Achievement: 09/23/16 Potential to Achieve Goals: Good Progress towards PT goals: Progressing toward goals    Frequency    Min 3X/week      PT Plan Current plan remains appropriate    Co-evaluation             End of Session Equipment Utilized During Treatment: Gait belt Activity Tolerance: Patient tolerated treatment well (nausea) Patient left: in bed;with call bell/phone within reach;with nursing/sitter in room     Time: 1027-1053 PT Time Calculation (min) (ACUTE ONLY): 26 min  Charges:  $Gait Training: 8-22 mins $Therapeutic Activity: 8-22 mins  G Codes:      Blondell Reveal Kistler 09/10/2016, 11:00 AM [phone]

## 2016-09-10 NOTE — Progress Notes (Signed)
This CM remembered this pt from a pervious admission and his refusal for [facility]. Spoke with pt at bedside concerning [place] and he shrugged his shoulders up. Will continue to follow for [facility] needs.

## 2016-09-10 NOTE — Progress Notes (Signed)
OT Cancellation Note  Patient Details Name: Nicholas Caldwell MRN: [id number] DOB: April 08, 1946   Cancelled Treatment:    Reason Eval/Treat Not Completed: Other (comment).  Pt just had morphine per RN and has low BP. Will check back tomorrow  Percy Winterrowd 09/10/2016, 4:13 PM  Lesle Chris, OTR/L [phone] 09/10/2016

## 2016-09-10 NOTE — Progress Notes (Addendum)
PULMONARY / CRITICAL CARE MEDICINE   Name: Nicholas Caldwell MRN: [id number] DOB: [birth year]    ADMISSION DATE:  09/05/2016 CONSULTATION DATE:  09/06/16  REFERRING MD:  Dr Charlies Silvers  CHIEF COMPLAINT:  Lethargy, AMS  HISTORY OF PRESENT ILLNESS:   70 yo obese man, hx OSA (not on CPAP), COPD, NICM with systolic CHF, parox A fib, hx prolonged QT-c. He was admitted 11/3 with acute dyspnea in setting B interstitial infiltrates on CXR. He was diuresed. On 11/4 he became lethargic, hypoxemic. ABG with hypercapnia. He moved to the ICu with severe lethargy to start BiPAP.  Some concern for possible CVA given the acute change in MS. Intubated on 11/4 pm.   SUBJECTIVE:  Compliant with bipap overnight Diuresing well with lasix Denies pain Afebrile HR better controlled   VITAL SIGNS: BP 105/67   Pulse 96   Temp 98.6 F (37 C) (Axillary)   Resp 19   Ht 6' (1.829 m)   Wt 283 lb 8.2 oz (128.6 kg)   SpO2 98%   BMI 38.45 kg/m   HEMODYNAMICS:    VENTILATOR SETTINGS:    INTAKE / OUTPUT: I/O last 3 completed shifts: In: -  Out: 4150 [Urine:4150]  PHYSICAL EXAMINATION: General:  Obese man,chr ill  Neuro:  slepy this am,, non focal.  HEENT:  PERRL, oral secretions.  Cardiovascular:  Regular, no M Lungs:  B scattered crackles, no wheeze Abdomen:  Non-tender Musculoskeletal:  No edema Skin:  No rash  LABS:  BMET  Recent Labs Lab 09/08/16 1422 09/09/16 0310 09/10/16 0323  NA 143 142 139  K 3.1* 3.1* 3.7  CL 95* 92* 92*  CO2 42* 39* 37*  BUN 12 12 27*  CREATININE 0.81 0.73 1.18  GLUCOSE 113* 162* 168*    Electrolytes  Recent Labs Lab 09/08/16 1422 09/09/16 0310 09/10/16 0323  CALCIUM 8.0* 8.0* 8.0*  MG  --  1.6*  --   PHOS  --  3.9  --     CBC  Recent Labs Lab 09/07/16 0337 09/08/16 0324 09/09/16 0310  WBC 10.4 9.3 8.8  HGB 11.0* 10.7* 10.7*  HCT 36.3* 34.9* 34.3*  PLT 459* 425* 393    Coag's  Recent Labs Lab 09/05/16 1936  INR 1.14    Sepsis  Markers No results for input(s): LATICACIDVEN, PROCALCITON, O2SATVEN in the last 168 hours.  ABG  Recent Labs Lab 09/06/16 1614 09/06/16 1959 09/06/16 2251  PHART 7.287* 7.265* 7.397  PCO2ART 82.3* 88.4* 62.9*  PO2ART 62.5* 132* 87.2    Liver Enzymes No results for input(s): AST, ALT, ALKPHOS, BILITOT, ALBUMIN in the last 168 hours.  Cardiac Enzymes  Recent Labs Lab 09/05/16 2311 09/06/16 0335 09/06/16 0951  TROPONINI 0.06* 0.06* 0.04*    Glucose  Recent Labs Lab 09/09/16 1159 09/09/16 1538 09/09/16 2045 09/09/16 2306 09/10/16 0419 09/10/16 0722  GLUCAP 183* 236* 240* 214* 174* 137*    Imaging No results found.   STUDIES:  Echo 11/4 EF 25%, RVSP 54  CULTURES:  ANTIBIOTICS:  SIGNIFICANT EVENTS: Acute altered MS 11/4, started BiPAP Intubated 11/4 pm  LINES/TUBES: ETT 11/4 >> 11/6   DISCUSSION: 70 yo man, hx COPD, systolic CHF, COPD, untreated OSA. Admitted with acute resp failure and suspected sCHF exacerbation. Experienced lethargy and declining MS 11/4 -intubated. Non focal exam hence MRI cancelled   ASSESSMENT / PLAN:  PULMONARY A: Acute hypoxemic and hypercapneic respiratory failure OSA COPD  Without apparent exacerbation.  Acute Cardiogenic pulmonary edema Hypercarbia multifactorial - overlap + methadone  P:   BiPAP q hs, will need bipap Rx on discharge & PSG as out pt BD's as ordered    CARDIOVASCULAR A:  HTN NICM Acute on chronic systolic CHF exacerbation Hyperlipidemia  Sinus tachy P:  Coreg to home dose, digoxin, hydralazine, Imdur, crestor - metoprolol prn  RENAL A:   Hypokalemia Hypomagnesemia P:   Diuresis as renal fxn and BP tolerate - drop lasix to 60 q 24 Replete lytes as needed  GASTROINTESTINAL A:   SUP  P:   carafate (avoiding meds that will affect QT-c)   ENDOCRINE A:   DM   P:   Hold glipizide Continue levemir SSI CHO mod diet  NEUROLOGIC A:   Lethargy and AMS. Suspect that this was  due to hypercapnia. Much improved 11/5,  hence defer MRI brain P:   resume home methadone (watch for qT prolongation) while under observation    FAMILY  - Updates: daughter requesting POA status, HHPT recommended, note repeated admits x 4/ last 38m  - Inter-disciplinary family meet or Palliative Care meeting due by:  NA    RKara MeadMD. FCCP. [facility] Pager [phone] If no response call [phone]    09/10/2016, 9:01 AM  Addendum- discussed with daughter, wife-they have several concerns-mold in the house, repeated admits, no one to supervise his medications especially methadone, concerned whether he is taking more than prescribed, he is "hardheaded"-they would prefer rehabilitation placement for 2-4 weeks but this is not possible at least nursing visits at home to supervise medications. He will certainly need a BiPAP on discharge Will ask case manager to investigate  ARigoberto NoelMD

## 2016-09-10 NOTE — Progress Notes (Signed)
Inpatient Diabetes Program Recommendations  AACE/ADA: New Consensus Statement on Inpatient Glycemic Control (2015)  Target Ranges:  Prepandial:   less than 140 mg/dL      Peak postprandial:   less than 180 mg/dL (1-2 hours)      Critically ill patients:  140 - 180 mg/dL   Results for Nicholas Caldwell, Nicholas Caldwell (MRN [id number]) as of 09/10/2016 09:13  Ref. Range 09/08/2016 23:35 09/09/2016 04:18 09/09/2016 09:02 09/09/2016 11:59 09/09/2016 15:38 09/09/2016 20:45  Glucose-Capillary Latest Ref Range: 65 - 99 mg/dL 152 (H) 146 (H) 194 (H) 183 (H) 236 (H) 240 (H)   Results for Nicholas Caldwell, Nicholas Caldwell (MRN [id number]) as of 09/10/2016 09:13  Ref. Range 09/09/2016 23:06 09/10/2016 04:19 09/10/2016 07:22  Glucose-Capillary Latest Ref Range: 65 - 99 mg/dL 214 (H) 174 (H) 137 (H)    Admit with: SOB/ LE Edema  History: DM, CHF  Home DM Meds: Levemir 44 units QPM       Novolog 0-15 units TID       Metformin 1000 mg BID       Glipizide 10 mg BID  Current Insulin Orders: Levemir 20 units QPM      Novolog Sensitive Correction Scale/ SSI (0-9 units) Q4 hours      MD- Please consider the following in-hospital insulin adjustments:  1. Increase/Change Novolog Correction Scale/ SSI to Moderate scale (0-15 units) TID AC + HS (currently ordered as Sensitive scale 0-9 units Q4 hours)  2. Start Novolog Meal Coverage: Novolog 4 units TIDWC (hold if pt eats <50% of meal)       --Will follow patient during hospitalization--  Wyn Quaker RN, MSN, CDE Diabetes Coordinator Inpatient Glycemic Control Team Team Pager: [phone] (8a-5p)

## 2016-09-10 NOTE — Progress Notes (Signed)
RN had long discussion with daughter in hallway about multi concerns she has with father and home conditions. States family and MD are to have serious discussion with her father tomorrow, 09/11/16, and get Case Manager and Social Works input for care post discharge. Eulas Post, RN

## 2016-09-10 NOTE — Progress Notes (Addendum)
CSW consulted for SNF placement / assistance with HCPOA. PN reviewed. PT has recommended HHPT at d/c.  RNCM will assist with d/c planning.   Please consult [place] services for assistance with HCPOA.  CSW signing off.  Werner Lean LCSW [phone]

## 2016-09-10 NOTE — Progress Notes (Signed)
Had discussion with patient regarding Methadone. Patient requesting and explained was not due until 10 pm and that his telemetry reading (QTC) at that time would determine if evening dose would be given. Patient became addiment that he wanted his med and that "I could get some [facility] to bring me some and you would never know". Advised patient of safety concerns of receiving meds if telemetry measurements were off. Patient not satisfied with RNs answer. Nicholas Post, RN

## 2016-09-11 DIAGNOSIS — E1165 Type 2 diabetes mellitus with hyperglycemia: Secondary | ICD-10-CM

## 2016-09-11 DIAGNOSIS — Z794 Long term (current) use of insulin: Secondary | ICD-10-CM

## 2016-09-11 DIAGNOSIS — E118 Type 2 diabetes mellitus with unspecified complications: Secondary | ICD-10-CM

## 2016-09-11 LAB — GLUCOSE, CAPILLARY
GLUCOSE-CAPILLARY: 256 mg/dL — AB (ref 65–99)
Glucose-Capillary: 213 mg/dL — ABNORMAL HIGH (ref 65–99)
Glucose-Capillary: 240 mg/dL — ABNORMAL HIGH (ref 65–99)
Glucose-Capillary: 252 mg/dL — ABNORMAL HIGH (ref 65–99)

## 2016-09-11 LAB — BASIC METABOLIC PANEL
ANION GAP: 10 (ref 5–15)
BUN: 36 mg/dL — ABNORMAL HIGH (ref 6–20)
CALCIUM: 7.9 mg/dL — AB (ref 8.9–10.3)
CO2: 32 mmol/L (ref 22–32)
CREATININE: 1.15 mg/dL (ref 0.61–1.24)
Chloride: 93 mmol/L — ABNORMAL LOW (ref 101–111)
Glucose, Bld: 216 mg/dL — ABNORMAL HIGH (ref 65–99)
Potassium: 4.2 mmol/L (ref 3.5–5.1)
SODIUM: 135 mmol/L (ref 135–145)

## 2016-09-11 MED ORDER — HYDROCODONE-ACETAMINOPHEN 5-325 MG PO TABS
1.0000 | ORAL_TABLET | Freq: Four times a day (QID) | ORAL | Status: DC | PRN
Start: 2016-09-11 — End: 2016-09-16
  Administered 2016-09-11 – 2016-09-15 (×9): 1 via ORAL
  Filled 2016-09-11 (×10): qty 1

## 2016-09-11 MED ORDER — METOLAZONE 5 MG PO TABS
5.0000 mg | ORAL_TABLET | Freq: Every day | ORAL | Status: DC
  Administered 2016-09-11 – 2016-09-12 (×2): 5 mg via ORAL
  Filled 2016-09-11 (×3): qty 1

## 2016-09-11 MED ORDER — INSULIN DETEMIR 100 UNIT/ML ~~LOC~~ SOLN
30.0000 [IU] | Freq: Every evening | SUBCUTANEOUS | Status: DC
  Administered 2016-09-11 – 2016-09-12 (×2): 30 [IU] via SUBCUTANEOUS
  Filled 2016-09-11 (×3): qty 0.3

## 2016-09-11 NOTE — Progress Notes (Signed)
Pt is confused he thinks this CM is trying to sale him something and asked that this CM not come back. Asked CSW to follow for SNF qualification.

## 2016-09-11 NOTE — Progress Notes (Signed)
Spoke with wife who request HHRN/PT and DME. Wife Fraser Din states pt will discharge home.

## 2016-09-11 NOTE — Progress Notes (Signed)
Pt not co-operating,  not answering questions for RT. Just shrugs or ignores RT.

## 2016-09-11 NOTE — Progress Notes (Signed)
Spoke with pt again concerning discharge plan.  Pt would not answer any questions concerning [place] agencies. Home Health Agencies List left with the pt.   Pt states that he is not sure about CPAP machine.  Pt asked that this CM would come back tomorrow.

## 2016-09-11 NOTE — Progress Notes (Signed)
Pt's wife Fraser Din [phone], daughter [name] number [phone].

## 2016-09-11 NOTE — Progress Notes (Signed)
PULMONARY / CRITICAL CARE MEDICINE   Name: Nicholas Caldwell MRN: [id number] DOB: September 28, 1946    ADMISSION DATE:  09/05/2016 CONSULTATION DATE:  09/06/16  REFERRING MD:  Dr Charlies Silvers  CHIEF COMPLAINT:  Lethargy, AMS  HISTORY OF PRESENT ILLNESS:   70 yo obese man, hx OSA (not on CPAP), COPD, NICM with systolic CHF, parox A fib, hx prolonged QT-c. He was admitted 11/3 with acute dyspnea in setting B interstitial infiltrates on CXR. He was diuresed. On 11/4 he became lethargic, hypoxemic. ABG with hypercapnia. He moved to the ICu with severe lethargy to start BiPAP.  Some concern for possible CVA given the acute change in MS. Intubated on 11/4 pm.   SUBJECTIVE:  Compliant with bipap overnight Denies pain Afebrile    VITAL SIGNS: BP 108/60 (BP Location: Left Arm)   Pulse 83   Temp 97.9 F (36.6 C) (Axillary)   Resp 16   Ht 6' (1.829 m)   Wt 283 lb 8.2 oz (128.6 kg)   SpO2 94%   BMI 38.45 kg/m   HEMODYNAMICS:    VENTILATOR SETTINGS:    INTAKE / OUTPUT: I/O last 3 completed shifts: In: 600 [P.O.:600] Out: 825 [Urine:825]  PHYSICAL EXAMINATION: General:  Obese man,chr ill  Neuro:  Alert  this am,, non focal.  HEENT:  PERRL, oral secretions.  Cardiovascular:  Regular, no M Lungs:  B scattered crackles, no wheeze Abdomen:  Non-tender Musculoskeletal:  No edema Skin:  No rash  LABS:  BMET  Recent Labs Lab 09/09/16 0310 09/10/16 0323 09/11/16 0333  NA 142 139 135  K 3.1* 3.7 4.2  CL 92* 92* 93*  CO2 39* 37* 32  BUN 12 27* 36*  CREATININE 0.73 1.18 1.15  GLUCOSE 162* 168* 216*    Electrolytes  Recent Labs Lab 09/09/16 0310 09/10/16 0323 09/11/16 0333  CALCIUM 8.0* 8.0* 7.9*  MG 1.6*  --   --   PHOS 3.9  --   --     CBC  Recent Labs Lab 09/07/16 0337 09/08/16 0324 09/09/16 0310  WBC 10.4 9.3 8.8  HGB 11.0* 10.7* 10.7*  HCT 36.3* 34.9* 34.3*  PLT 459* 425* 393    Coag's  Recent Labs Lab 09/05/16 1936  INR 1.14    Sepsis Markers No  results for input(s): LATICACIDVEN, PROCALCITON, O2SATVEN in the last 168 hours.  ABG  Recent Labs Lab 09/06/16 1614 09/06/16 1959 09/06/16 2251  PHART 7.287* 7.265* 7.397  PCO2ART 82.3* 88.4* 62.9*  PO2ART 62.5* 132* 87.2    Liver Enzymes No results for input(s): AST, ALT, ALKPHOS, BILITOT, ALBUMIN in the last 168 hours.  Cardiac Enzymes  Recent Labs Lab 09/05/16 2311 09/06/16 0335 09/06/16 0951  TROPONINI 0.06* 0.06* 0.04*    Glucose  Recent Labs Lab 09/10/16 0722 09/10/16 1127 09/10/16 1745 09/10/16 2015 09/10/16 2334 09/11/16 0726  GLUCAP 137* 195* 179* 200* 239* 213*    Imaging No results found.   STUDIES:  Echo 11/4 EF 25%, RVSP 54  CULTURES:  ANTIBIOTICS:  SIGNIFICANT EVENTS: Acute altered MS 11/4, started BiPAP Intubated 11/4 pm  LINES/TUBES: ETT 11/4 >> 11/6   DISCUSSION: 70 yo man, hx COPD, systolic CHF, COPD, untreated OSA. Admitted with acute resp failure and suspected sCHF exacerbation. Experienced lethargy and declining MS 11/4 -intubated.  Feel combination of CHF, COPD & methadone   ASSESSMENT / PLAN:  PULMONARY A: Acute hypoxemic and hypercapneic respiratory failure OSA COPD  Without apparent exacerbation.  Acute Cardiogenic pulmonary edema Hypercarbia multifactorial - overlap +  methadone P:   BiPAP q hs, will need bipap Rx on discharge & PSG as out pt BD's as ordered    CARDIOVASCULAR A:  HTN NICM Acute on chronic systolic CHF exacerbation Hyperlipidemia  Sinus tachy P:  Coreg to home dose, digoxin, hydralazine, Imdur, crestor - metoprolol prn  RENAL A:   Hypokalemia Hypomagnesemia P:    drop lasix to 60 q 24, resume zaroxlyn - watch renal function Replete lytes as needed  GASTROINTESTINAL A:   SUP  P:   carafate (avoiding meds that will affect QT-c)   ENDOCRINE A:   DM   P:   Hold glipizide Increase levemir to 30- home dose SSI CHO mod diet  NEUROLOGIC A:   Lethargy and AMS. Suspect  that this was due to hypercapnia. Much improved 11/5,  hence defer MRI brain P:   resumed home methadone (watch for qT prolongation) while under observation vicodin prn Discussed with him, not to increase doses    FAMILY  - Updates: daughter requesting POA status, HHPT recommended, note repeated admits x 4/ last 58m  11/8- discussed with daughter, wife-they have several concerns-mold in the house, repeated admits, no one to supervise his medications especially methadone, concerned whether he is taking more than prescribed, he is "hardheaded"  Since rehabilitation placement  not possible, he would benefit from  nursing visits at home to supervise medications. He will certainly need a BiPAP on discharge He is agreeable to both Will ask case manager to provide  - Inter-disciplinary family meet or Palliative Care meeting due by:  Done, 11/8    RKara MeadMD. FCCP. [facility] Pager [phone] no response call [phone]     09/11/2016, 10:00 AM

## 2016-09-11 NOTE — Consult Note (Addendum)
[facility] CM Inpatient Consult   09/11/2016  Dannie Hattabaugh 02/05/1946 [id number]    [facility] follow up with Mr. Pascucci. Received referral from inpatient RNCM. Second bedside visit made to discuss re-engaging with [place] [facility]. Multiple unsuccessful attempts made to contact him after last hospitalization. Please see chart review tab then notes for patient outreach details.   During initial bedside visit, Mr. Zappia was not engaged in conversation with Probation officer. He drifted off to sleep while Probation officer was speaking with him.   Came back a second time while daughter and wife were at bedside. Mr. Hackman was more alert and was eating his lunch.Mr. Lall did not want writer speaking with wife about [place] [facility] services. Mr. Celani started going on about he is the leader and his wife follows.  Discussed that if Mr. Allende decides to become interested in being engaged with [place] [facility] to please call. Contact information left at bedside.  Of note, Mr. Reier endorses that they had 30 or so voicemail messages on their voicemail machine that they had not heard prior. Made them aware that [place] Management messages had been left.   Made inpatient RNCM aware of the above and that Mr. Perry is not interested in [place] [facility] services at this time.  Marthenia Rolling, MSN-Ed, RN,BSN [facility] Liaison [phone]

## 2016-09-11 NOTE — Progress Notes (Signed)
MD need [place] orders for HHRN/HHPT, CPAP machine, Home O2 if needed, and face to face. Thanks

## 2016-09-11 NOTE — Progress Notes (Addendum)
Spoke with pt's wife Fraser Din and daughter [name] for a long length of time concerning pt discharging home with [facility] vs SNF.  Explained to both dtg and wife that the pt did not want to discuss with me Home Health.  Daughter states pt may benefit with going to a Rehab vs home with [facility]. Wife selected [facility] for [facility] needs if pt is discharged to home.  Referral given to in home rep.

## 2016-09-11 NOTE — Evaluation (Signed)
Occupational Therapy Evaluation Patient Details Name: Nicholas Caldwell MRN: [id number] DOB: Aug 21, 1946 Today's Date: 09/11/2016    History of Present Illness 70 y.o. male with medical history significant of non-ischemic cardiomyopathy, Chronic Systolic CHF EF 85-02%,  A-fib on Xarelto,Prolonged Q-T interval , HTN, HLD, COPD on 2 L O2 at home PRN,, Tobacco Abuse, DM Type 2 uncontrolled, chronic pain on Methadone who presents with SOB, LE edema.   Clinical Impression   Pt was admitted for the above.  Unclear if pt needed help for adls prior to admission:  He reported 2 different things. Will follow in acute with supervision level goals for toilet transfers, retrieving clothing and standing for multiple grooming tasks.    Follow Up Recommendations  Supervision/Assistance - 24 hour    Equipment Recommendations  None recommended by OT    Recommendations for Other Services       Precautions / Restrictions Precautions Precautions: Fall Precaution Comments: monitor O2 Restrictions Weight Bearing Restrictions: No      Mobility Bed Mobility         Supine to sit: Modified independent (Device/Increase time);HOB elevated     General bed mobility comments: HOB raised 60  Transfers   Equipment used: Rolling walker (2 wheeled) Transfers: Sit to/from Stand Sit to Stand: Min guard;From elevated surface         General transfer comment: for safety; cues for UE placement when standing as pt pulls up on RW    Balance                                            ADL Overall ADL's : Needs assistance/impaired     Grooming: Supervision/safety;Standing   Upper Body Bathing: Set up;Sitting   Lower Body Bathing: Moderate assistance;Sit to/from stand   Upper Body Dressing : Set up;Sitting   Lower Body Dressing: Maximal assistance;Sit to/from stand   Toilet Transfer: Min guard;Ambulation;BSC;RW   Toileting- Clothing Manipulation and Hygiene:  Supervision/safety;Sit to/from stand         General ADL Comments: at first pt states that he is independent with all adls but then stated he couldn't reach feet and that wife assists him.  Ambulated to bathroom and sat on commode.  He wanted to walk in hallway after this.  When pt walked to door, he propped forearms on walker and leaned forward stating that his neck hurt. Returned to chair.  Min guard for safety     Vision     Perception     Praxis      Pertinent Vitals/Pain Pain Assessment: Faces Faces Pain Scale: Hurts even more Pain Location: neck Pain Intervention(s): Limited activity within patient's tolerance;Monitored during session;Premedicated before session;Repositioned     Hand Dominance     Extremity/Trunk Assessment Upper Extremity Assessment Upper Extremity Assessment: Overall WFL for tasks assessed           Communication Communication Communication: No difficulties   Cognition Arousal/Alertness: Awake/alert Behavior During Therapy: WFL for tasks assessed/performed Overall Cognitive Status: No family/caregiver present to determine baseline cognitive functioning--reported 2 different things for PLOF, asked same question about what I was going to do, multiple times                     General Comments       Exercises       Shoulder Instructions  Home Living Family/patient expects to be discharged to:: Private residence Living Arrangements: Spouse/significant other Available Help at Discharge: Family               Bathroom Shower/Tub: Tub/shower unit Shower/tub characteristics: Curtain Biochemist, clinical: Standard     Home Equipment: Environmental consultant - 2 wheels;Cane - single point;Bedside commode;Shower seat          Prior Functioning/Environment Level of Independence: Independent with assistive device(s)        Comments: uses RW prn        OT Problem List: Decreased strength;Decreased activity tolerance;Impaired balance  (sitting and/or standing);Pain;Cardiopulmonary status limiting activity   OT Treatment/Interventions: Self-care/ADL training;DME and/or AE instruction;Patient/family education;Balance training    OT Goals(Current goals can be found in the care plan section) Acute Rehab OT Goals Patient Stated Goal: to be able to walk, likes to watch football on tv OT Goal Formulation: With patient Time For Goal Achievement: 09/18/16 Potential to Achieve Goals: Good ADL Goals Pt Will Perform Grooming: with supervision;standing (3 tasks) Pt Will Transfer to Toilet: with supervision;ambulating;bedside commode Additional ADL Goal #1: pt will gather clothes at supervision level wtih RW  OT Frequency: Min 2X/week   Barriers to D/C:            Co-evaluation              End of Session    Activity Tolerance: Patient limited by fatigue;Patient limited by pain Patient left: in chair;with call bell/phone within reach   Time: 1027-1052 OT Time Calculation (min): 25 min Charges:  OT General Charges $OT Visit: 1 Procedure OT Evaluation $OT Eval Low Complexity: 1 Procedure OT Treatments $Self Care/Home Management : 8-22 mins G-Codes:    Lillyth Spong 09/17/2016, 12:20 PM Lesle Chris, OTR/L [phone] 2016/09/17

## 2016-09-11 NOTE — Care Management Important Message (Signed)
Important Message  Patient Details  Name: Nicholas Caldwell MRN: [id number] Date of Birth: [birth year]   Medicare Important Message Given:  Yes    Camillo Flaming 09/11/2016, 10:59 AMImportant Message  Patient Details  Name: Nicholas Caldwell MRN: [id number] Date of Birth: [birth year]   Medicare Important Message Given:  Yes    Camillo Flaming 09/11/2016, 10:57 AM

## 2016-09-12 DIAGNOSIS — I10 Essential (primary) hypertension: Secondary | ICD-10-CM

## 2016-09-12 LAB — BASIC METABOLIC PANEL
ANION GAP: 7 (ref 5–15)
BUN: 35 mg/dL — AB (ref 6–20)
CHLORIDE: 94 mmol/L — AB (ref 101–111)
CO2: 35 mmol/L — ABNORMAL HIGH (ref 22–32)
Calcium: 8.1 mg/dL — ABNORMAL LOW (ref 8.9–10.3)
Creatinine, Ser: 1.04 mg/dL (ref 0.61–1.24)
Glucose, Bld: 174 mg/dL — ABNORMAL HIGH (ref 65–99)
POTASSIUM: 4.3 mmol/L (ref 3.5–5.1)
SODIUM: 136 mmol/L (ref 135–145)

## 2016-09-12 LAB — GLUCOSE, CAPILLARY
GLUCOSE-CAPILLARY: 254 mg/dL — AB (ref 65–99)
Glucose-Capillary: 169 mg/dL — ABNORMAL HIGH (ref 65–99)
Glucose-Capillary: 172 mg/dL — ABNORMAL HIGH (ref 65–99)
Glucose-Capillary: 176 mg/dL — ABNORMAL HIGH (ref 65–99)

## 2016-09-12 MED ORDER — MAGNESIUM SULFATE 2 GM/50ML IV SOLN
2.0000 g | Freq: Once | INTRAVENOUS | Status: AC
  Administered 2016-09-12: 2 g via INTRAVENOUS
  Filled 2016-09-12: qty 50

## 2016-09-12 MED ORDER — FUROSEMIDE 10 MG/ML IJ SOLN
14.0000 mg/h | INTRAVENOUS | Status: DC
  Administered 2016-09-12: 8 mg/h via INTRAVENOUS
  Administered 2016-09-13: 4 mg/h via INTRAVENOUS
  Filled 2016-09-12: qty 4
  Filled 2016-09-12 (×3): qty 25

## 2016-09-12 NOTE — Progress Notes (Signed)
Patient ambulated in the hallway without oxygen and his SATs decreased to 85 % (Room Air).  Patient had his oxygen replaced on 2 L/Min Nasal Cannula when he returned to his room.

## 2016-09-12 NOTE — Progress Notes (Signed)
[facility] PROGRESS NOTE    Progress Note  Nicholas Caldwell  GMW:[id number] DOB: 11-17-1945 DOA: 09/05/2016 PCP: [facility]     Brief Narrative:   Nicholas Caldwell is an 70 y.o. male obese male with past medical history of COPD, nonischemic systolic heart failure, paroxysmal atrial fibrillation on anticoagulation, history of prolonged QT on methadone came into the hospital with acute pulmonary edema became lethargic and had to be intubated due to hypoxemia and lethargy and hypercarbia, intubated on 09/06/2016 extubated 09/10/2016  Assessment/Plan:   Acute on chronic respiratory failure with hypoxia (HCC)And hypercapnia due to acute decompensated diastolic heart failure/pulmonary edema: Start IV Lasix infusion with metolazone, Place TED hose. Monitor electrolytes and blood pressure, restrict his fluids low sodium diet. Daily standing weights. Continue Coreg and Entresto.  Chronic COPD: Continue current regimen no changes were made.   Essential hypertension Continue ontrast oh, Coreg and hydralazine. He will need a diuretic at discharge for home.  Hyperlipidemia: Continue statins.  PAF: CHA2DS2-VASc Score is 5: Controlled continuous Xarelto, Coreg and digoxin.  Morbid obesity ([place]) Counseling  Uncontrolled type 2 diabetes mellitus with complication (HCC) Continue Levemir plus sliding scale insulin.   Acute encephalopathy: Likely due to hypercarbia he was intubated on 09/06/2016, exudate 09/10/2016. No back to baseline.   DVT prophylaxis: apixiban Family Communication:wife Disposition Plan/Barrier to D/C: home in 4-5 days Code Status:     Code Status Orders        Start     Ordered   09/05/16 2120  Full code  Continuous     09/05/16 2122    Code Status History    Date Active Date Inactive Code Status Order ID Comments User Context   07/28/2016  2:16 AM 08/04/2016  5:33 PM Full Code [id number]  Toy Baker, MD Inpatient   06/30/2016  2:49 AM  07/04/2016  3:46 PM Full Code [id number]  Edwin Dada, MD Inpatient   05/25/2016 11:45 PM 05/26/2016  5:40 PM Full Code [id number]  Edwin Dada, MD Inpatient   02/19/2015  1:06 PM 02/23/2015  8:20 PM Full Code [id number]  Wilber Oliphant, MD ED   11/06/2014  4:38 PM 11/07/2014  3:35 PM Full Code [id number]  Jettie Booze, MD Inpatient   10/31/2014 11:14 PM 11/06/2014  4:38 PM Full Code [id number]  Allyne Gee, MD ED        IV Access:    Peripheral IV   Procedures and diagnostic studies:   No results found.   Medical Consultants:    None.  Anti-Infectives:   none  Subjective:    Geoffery Caldwell  patient is very defensive and combative but has no new complaints.  Objective:    Vitals:   09/11/16 2129 09/12/16 0618 09/12/16 0807 09/12/16 1142  BP: (!) 107/56 108/63    Pulse: 89 81    Resp: 20 16    Temp: 98.1 F (36.7 C) 98.2 F (36.8 C)    TempSrc: Oral     SpO2: 96% 94% 93% 96%  Weight:  129 kg (284 lb 6.3 oz)    Height:        Intake/Output Summary (Last 24 hours) at 09/12/16 1218 Last data filed at 09/11/16 2200  Gross per 24 hour  Intake              600 ml  Output             1450 ml  Net             -  850 ml   Filed Weights   09/09/16 0500 09/10/16 0432 09/12/16 0618  Weight: 126.4 kg (278 lb 10.6 oz) 128.6 kg (283 lb 8.2 oz) 129 kg (284 lb 6.3 oz)    Exam: General exam: In no acute distress. Respiratory system: Good air movement and clear to auscultation. Cardiovascular system: S1 & S2 heard, RRR. +JVD Gastrointestinal system: Abdomen is nondistended, soft and nontender.  Extremities: No pedal edema. Skin: No rashes, lesions or ulcers Psychiatry: Judgement and insight appear normal. Mood & affect appropriate.    Data Reviewed:    Labs: Basic Metabolic Panel:  Recent Labs Lab 09/08/16 1422 09/09/16 0310 09/10/16 0323 09/11/16 0333 09/12/16 0336  NA 143 142 139 135 136  K 3.1* 3.1* 3.7 4.2 4.3  CL 95* 92* 92* 93* 94*    CO2 42* 39* 37* 32 35*  GLUCOSE 113* 162* 168* 216* 174*  BUN 12 12 27* 36* 35*  CREATININE 0.81 0.73 1.18 1.15 1.04  CALCIUM 8.0* 8.0* 8.0* 7.9* 8.1*  MG  --  1.6*  --   --   --   PHOS  --  3.9  --   --   --    GFR Estimated Creatinine Clearance: 94.4 mL/min (by C-G formula based on SCr of 1.04 mg/dL). Liver Function Tests: No results for input(s): AST, ALT, ALKPHOS, BILITOT, PROT, ALBUMIN in the last 168 hours. No results for input(s): LIPASE, AMYLASE in the last 168 hours. No results for input(s): AMMONIA in the last 168 hours. Coagulation profile  Recent Labs Lab 09/05/16 1936  INR 1.14    CBC:  Recent Labs Lab 09/05/16 1936 09/06/16 2245 09/07/16 0337 09/08/16 0324 09/09/16 0310  WBC 8.7 10.9* 10.4 9.3 8.8  NEUTROABS 6.0 8.3*  --   --   --   HGB 11.5* 10.7* 11.0* 10.7* 10.7*  HCT 37.6* 34.7* 36.3* 34.9* 34.3*  MCV 88.1 88.1 88.5 86.2 85.3  PLT 428* 424* 459* 425* 393   Cardiac Enzymes:  Recent Labs Lab 09/05/16 2311 09/06/16 0335 09/06/16 0951  TROPONINI 0.06* 0.06* 0.04*   BNP (last 3 results) No results for input(s): PROBNP in the last 8760 hours. CBG:  Recent Labs Lab 09/11/16 0726 09/11/16 1324 09/11/16 1617 09/11/16 2007 09/12/16 0819  GLUCAP 213* 256* 240* 252* 169*   D-Dimer: No results for input(s): DDIMER in the last 72 hours. Hgb A1c: No results for input(s): HGBA1C in the last 72 hours. Lipid Profile: No results for input(s): CHOL, HDL, LDLCALC, TRIG, CHOLHDL, LDLDIRECT in the last 72 hours. Thyroid function studies: No results for input(s): TSH, T4TOTAL, T3FREE, THYROIDAB in the last 72 hours.  Invalid input(s): FREET3 Anemia work up: No results for input(s): VITAMINB12, FOLATE, FERRITIN, TIBC, IRON, RETICCTPCT in the last 72 hours. Sepsis Labs:  Recent Labs Lab 09/06/16 2245 09/07/16 0337 09/08/16 0324 09/09/16 0310  WBC 10.9* 10.4 9.3 8.8   Microbiology Recent Results (from the past 240 hour(s))  MRSA PCR  Screening     Status: None   Collection Time: 09/06/16  1:18 PM  Result Value Ref Range Status   MRSA by PCR NEGATIVE NEGATIVE Final    Comment:        The GeneXpert MRSA Assay (FDA approved for NASAL specimens only), is one component of a comprehensive MRSA colonization surveillance program. It is not intended to diagnose MRSA infection nor to guide or monitor treatment for MRSA infections.      Medications:   . carvedilol  25 mg Oral BID  WC  . digoxin  0.125 mg Oral Daily  . gabapentin  1,800 mg Oral QHS  . gabapentin  600 mg Oral Q lunch  . hydrALAZINE  25 mg Oral TID  . hydrocortisone cream   Topical BID  . insulin aspart  0-15 Units Subcutaneous TID WC  . insulin aspart  0-5 Units Subcutaneous QHS  . insulin detemir  30 Units Subcutaneous QPM  . ipratropium-albuterol  3 mL Nebulization QID  . magnesium sulfate 1 - 4 g bolus IVPB  2 g Intravenous Once  . methadone  10 mg Oral Q12H  . metolazone  5 mg Oral Daily  . potassium chloride  40 mEq Oral BID  . protein supplement shake  11 oz Oral Q24H  . rivaroxaban  20 mg Per Tube Q supper  . rosuvastatin  20 mg Oral Daily  . sacubitril-valsartan  1 tablet Oral BID  . sodium chloride flush  3 mL Intravenous Q12H   Continuous Infusions: . furosemide (LASIX) infusion 8 mg/hr (09/12/16 1157)    Time spent: 25 min   LOS: 7 days   Charlynne Cousins  [facility] Pager [phone]  *Please refer to [email or web address], password [email or web address] to get updated schedule on who will round on this patient, as hospitalists switch teams weekly. If 7PM-7AM, please contact night-coverage at [email or web address], password [email or web address] for any overnight needs.  09/12/2016, 12:18 PM

## 2016-09-12 NOTE — Progress Notes (Deleted)
Physical Therapy Treatment Patient Details Name: Taris Galindo MRN: [id number] DOB: [birth year] Today's Date: 09/12/2016    History of Present Illness 70 y.o. male with medical history significant of non-ischemic cardiomyopathy, Chronic Systolic CHF EF 38-25%,  A-fib on Xarelto,Prolonged Q-T interval , HTN, HLD, COPD on 2 L O2 at home PRN,, Tobacco Abuse, DM Type 2 uncontrolled, chronic pain on Methadone who presents with SOB, LE edema.    PT Comments    Pt unable to tolerate amb due to 10/10 R hip/back pain.  Pt was only able to get to recliner.  Remained on 6 lts to maintain sats above 90%.  Follow Up Recommendations  Home health PT     Equipment Recommendations       Recommendations for Other Services       Precautions / Restrictions Precautions Precautions: Fall    Mobility  Bed Mobility Overal bed mobility: Needs Assistance Bed Mobility: Rolling;Sidelying to Sit Rolling: Min assist;Mod assist Sidelying to sit: Mod assist;Max assist       General bed mobility comments: pt preferred to "log roll" from supine to sit due to back/R hip pain.  Able to sit EOB briefly due to increased pain from 3/10 at rest to 10/10 EOB.  Pt in obvious distress.  RN called for pain meds.    Transfers Overall transfer level: Needs assistance Equipment used: None Transfers: Stand Pivot Transfers Sit to Stand: Min assist;Mod assist         General transfer comment: pt only able to briefly stand and turn to recliner dur to 10.10 R hip/back pain.    Ambulation/Gait             General Gait Details: unable to tolerate amb during this session due to R hip/back pain.     Stairs            Wheelchair Mobility    Modified Rankin (Stroke Patients Only)       Balance                                    Cognition Arousal/Alertness: Awake/alert Behavior During Therapy: WFL for tasks assessed/performed Overall Cognitive Status: Within Functional Limits for  tasks assessed                      Exercises      General Comments        Pertinent Vitals/Pain Pain Assessment: 0-10 Pain Score: 3  Pain Location: back Pain Descriptors / Indicators: Discomfort Pain Intervention(s): Limited activity within patient's tolerance;Monitored during session;Repositioned    Home Living                      Prior Function            PT Goals (current goals can now be found in the care plan section) Progress towards PT goals: Progressing toward goals    Frequency    Min 3X/week      PT Plan Current plan remains appropriate    Co-evaluation             End of Session Equipment Utilized During Treatment: Gait belt Activity Tolerance: Patient limited by pain Patient left: in chair;with call bell/phone within reach     Time: 1112-1140 PT Time Calculation (min) (ACUTE ONLY): 28 min  Charges:  $Gait Training: 8-22 mins $Therapeutic Activity: 23-37 mins  G Codes:      Rica Koyanagi  PTA [facility]  Acute  Rehab Pager      [phone]

## 2016-09-12 NOTE — Progress Notes (Signed)
Physical Therapy Treatment Patient Details Name: Nicholas Caldwell MRN: [id number] DOB: [birth year] Today's Date: 09/12/2016    History of Present Illness 70 y.o. male with medical history significant of non-ischemic cardiomyopathy, Chronic Systolic CHF EF 48-18%,  A-fib on Xarelto,Prolonged Q-T interval , HTN, HLD, COPD on 2 L O2 at home PRN,, Tobacco Abuse, DM Type 2 uncontrolled, chronic pain on Methadone who presents with SOB, LE edema.    PT Comments    Patient was seen in bed upon arrival with family present.  Pain was rated as a 4/10 in pt's neck, back and minor pain in L foot during ambulation. Supine to sit x min guard with elevated HOB. Sit to stand x min guard with VC's for safe UE placement. Gait x 100 ft with VC's to maintain an erect trunk to improve posture and for LE placement within the walker. Patient was seen on 2L of O2. O2 sats 96% at rest on Ra; during ambulation 95-96%, ambulation on RA 92-93%. HR max 107 bpm. Patient is limited d/t decreased activity tolerance and decreased endurance. Notified nursing of O2 sats during ambulation.  Follow Up Recommendations  Home health PT     Equipment Recommendations  None recommended by PT    Recommendations for Other Services       Precautions / Restrictions Precautions Precautions: Fall    Mobility  Bed Mobility Overal bed mobility: Needs Assistance Bed Mobility: Supine to Sit Rolling: Min guard Sidelying to sit: Mod assist;Max assist       General bed mobility comments: elevated HOB.  Transfers Overall transfer level: Needs assistance Equipment used: Rolling walker (2 wheeled) Transfers: Sit to/from Stand Sit to Stand: Min guard;From elevated surface         General transfer comment: VC's required for safe UE placement.   Ambulation/Gait Ambulation/Gait assistance: Min guard Ambulation Distance (Feet): 100 Feet Assistive device: Rolling walker (2 wheeled) Gait Pattern/deviations: Step-through pattern;Trunk  flexed;Decreased step length - right;Decreased step length - left;Decreased stride length Gait velocity: decreased Gait velocity interpretation: Below normal speed for age/gender General Gait Details: required 1 sitting rest break. HR 107 bpm max, O2 92-93% on RA. VC's to correct posture for safe LE placement within the walker during ambulation.   Stairs            Wheelchair Mobility    Modified Rankin (Stroke Patients Only)       Balance                                    Cognition Arousal/Alertness: Awake/alert Behavior During Therapy: WFL for tasks assessed/performed Overall Cognitive Status: Within Functional Limits for tasks assessed                      Exercises      General Comments        Pertinent Vitals/Pain Pain Assessment: 0-10 Pain Score: 5 Pain Location: neck, c/o of pain in L foot d/t wound. Pain Descriptors / Indicators: Discomfort Pain Intervention(s): Limited activity within patient's tolerance;Monitored during session;Repositioned    Home Living                      Prior Function            PT Goals (current goals can now be found in the care plan section) Progress towards PT goals: Progressing toward goals    Frequency  Min 3X/week      PT Plan Current plan remains appropriate    Co-evaluation             End of Session Equipment Utilized During Treatment: Gait belt Activity Tolerance: Patient tolerated treatment well;Patient limited by fatigue Patient left: in chair;with call bell/phone within reach;with family/visitor present     Time: 1112-1140 PT Time Calculation (min) (ACUTE ONLY): 28 min  Charges:  $Gait Training: 8-22 mins $Therapeutic Activity: 23-37 mins                    G CodesHall Busing, SPTA [facility] [phone]  Present and agree with above  Rica Koyanagi  PTA [facility]  Acute  Rehab Pager      [phone]

## 2016-09-13 DIAGNOSIS — E1169 Type 2 diabetes mellitus with other specified complication: Secondary | ICD-10-CM

## 2016-09-13 DIAGNOSIS — E785 Hyperlipidemia, unspecified: Secondary | ICD-10-CM

## 2016-09-13 LAB — BASIC METABOLIC PANEL
ANION GAP: 10 (ref 5–15)
BUN: 38 mg/dL — ABNORMAL HIGH (ref 6–20)
CHLORIDE: 89 mmol/L — AB (ref 101–111)
CO2: 38 mmol/L — ABNORMAL HIGH (ref 22–32)
Calcium: 8.4 mg/dL — ABNORMAL LOW (ref 8.9–10.3)
Creatinine, Ser: 1.09 mg/dL (ref 0.61–1.24)
Glucose, Bld: 218 mg/dL — ABNORMAL HIGH (ref 65–99)
POTASSIUM: 3.6 mmol/L (ref 3.5–5.1)
SODIUM: 137 mmol/L (ref 135–145)

## 2016-09-13 LAB — GLUCOSE, CAPILLARY
GLUCOSE-CAPILLARY: 165 mg/dL — AB (ref 65–99)
GLUCOSE-CAPILLARY: 218 mg/dL — AB (ref 65–99)
GLUCOSE-CAPILLARY: 191 mg/dL — AB (ref 65–99)
Glucose-Capillary: 180 mg/dL — ABNORMAL HIGH (ref 65–99)

## 2016-09-13 LAB — MAGNESIUM: MAGNESIUM: 2.3 mg/dL (ref 1.7–2.4)

## 2016-09-13 MED ORDER — METOLAZONE 5 MG PO TABS
5.0000 mg | ORAL_TABLET | Freq: Once | ORAL | Status: AC
  Administered 2016-09-13: 5 mg via ORAL
  Filled 2016-09-13: qty 1

## 2016-09-13 MED ORDER — SPIRONOLACTONE 25 MG PO TABS
25.0000 mg | ORAL_TABLET | Freq: Every day | ORAL | Status: DC
  Administered 2016-09-13 – 2016-09-16 (×4): 25 mg via ORAL
  Filled 2016-09-13 (×4): qty 1

## 2016-09-13 MED ORDER — DIAZEPAM 2 MG PO TABS
2.0000 mg | ORAL_TABLET | Freq: Two times a day (BID) | ORAL | Status: DC | PRN
  Administered 2016-09-13 – 2016-09-16 (×6): 2 mg via ORAL
  Filled 2016-09-13 (×6): qty 1

## 2016-09-13 MED ORDER — METOLAZONE 5 MG PO TABS: 5.0000 mg | ORAL_TABLET | Freq: Once | ORAL | Status: DC

## 2016-09-13 MED ORDER — INSULIN DETEMIR 100 UNIT/ML ~~LOC~~ SOLN
20.0000 [IU] | Freq: Two times a day (BID) | SUBCUTANEOUS | Status: DC
  Administered 2016-09-13 – 2016-09-16 (×7): 20 [IU] via SUBCUTANEOUS
  Filled 2016-09-13 (×8): qty 0.2

## 2016-09-13 MED ORDER — POTASSIUM CHLORIDE CRYS ER 20 MEQ PO TBCR: 40.0000 meq | EXTENDED_RELEASE_TABLET | Freq: Two times a day (BID) | ORAL | Status: DC

## 2016-09-13 NOTE — Progress Notes (Signed)
[facility] PROGRESS NOTE    Progress Note  Nicholas Caldwell  WER:[id number] DOB: [birth year] DOA: 09/05/2016 PCP: [facility]     Brief Narrative:   Nicholas Caldwell is an 70 y.o. male obese male with past medical history of COPD, nonischemic systolic heart failure, paroxysmal atrial fibrillation on anticoagulation, history of prolonged QT on methadone came into the hospital with acute pulmonary edema became lethargic and had to be intubated due to hypoxemia and lethargy and hypercarbia, intubated on 09/06/2016 extubated 09/10/2016, Likely due to a combination of COPD, obstructive sleep apnea and pulmonary edema. Now we have been diuresing him with IV Lasix with good response. His weight is trending down.  Assessment/Plan:   Acute on chronic respiratory failure with hypoxia (HCC)And hypercapnia due to acute decompensated diastolic heart failure/pulmonary edema: Brisk diuresis with IV Lasix infusion and metolazone. Will given potassium orally as it is trending down. Continue strict I's and O's. Daily standing weights. Weight is trending down today is 125.5 kg Monitor electrolytes and blood pressure, restrict his fluids low sodium diet. Continue Coreg and Entresto. Restart Aldactone.  Chronic COPD: Continue current regimen no changes were made.   Essential hypertension Continue Entresto, Coreg and hydralazine. He will need a diuretic at discharge for home.  Hyperlipidemia: Continue statins.  PAF: CHA2DS2-VASc Score is 5: Controlled continuous Xarelto, Coreg and digoxin.  Morbid obesity ([place]) Counseling  Uncontrolled type 2 diabetes mellitus with complication (HCC) Increase long-acting insulin continue sliding scale insulin. CBGs are trending up.   Acute encephalopathy: Likely due to hypercarbia he was intubated on 09/06/2016, exudate 09/10/2016. No back to baseline.   DVT prophylaxis: apixiban Family Communication:wife Disposition Plan/Barrier to D/C: home in 4-5  days Code Status:     Code Status Orders        Start     Ordered   09/05/16 2120  Full code  Continuous     09/05/16 2122    Code Status History    Date Active Date Inactive Code Status Order ID Comments User Context   07/28/2016  2:16 AM 08/04/2016  5:33 PM Full Code [id number]  Toy Baker, MD Inpatient   06/30/2016  2:49 AM 07/04/2016  3:46 PM Full Code [id number]  Edwin Dada, MD Inpatient   05/25/2016 11:45 PM 05/26/2016  5:40 PM Full Code [id number]  Edwin Dada, MD Inpatient   02/19/2015  1:06 PM 02/23/2015  8:20 PM Full Code [id number]  Wilber Oliphant, MD ED   11/06/2014  4:38 PM 11/07/2014  3:35 PM Full Code [id number]  Jettie Booze, MD Inpatient   10/31/2014 11:14 PM 11/06/2014  4:38 PM Full Code [id number]  Allyne Gee, MD ED        IV Access:    Peripheral IV   Procedures and diagnostic studies:   No results found.   Medical Consultants:    None.  Anti-Infectives:   none  Subjective:    Nicholas Caldwell  patient is very defensive and combative but has no new complaints.  Objective:    Vitals:   09/13/16 0012 09/13/16 0533 09/13/16 0656 09/13/16 0757  BP:  114/65    Pulse: 96 82    Resp: 20 17    Temp:  98.6 F (37 C)    TempSrc:  Axillary    SpO2: 95% 93%  92%  Weight:   125.5 kg (276 lb 11.2 oz)   Height:        Intake/Output Summary (Last 24 hours) at 09/13/16 3419  Last data filed at 09/13/16 0801  Gross per 24 hour  Intake             1262 ml  Output             7625 ml  Net            -6363 ml   Filed Weights   09/10/16 0432 09/12/16 0618 09/13/16 0656  Weight: 128.6 kg (283 lb 8.2 oz) 129 kg (284 lb 6.3 oz) 125.5 kg (276 lb 11.2 oz)    Exam: General exam: In no acute distress. Respiratory system: Good air movement and clear to auscultation. Cardiovascular system: S1 & S2 heard, RRR. +JVD Gastrointestinal system: Abdomen is nondistended, soft and nontender.  Extremities: No pedal edema. Skin: No rashes,  lesions or ulcers Psychiatry: Judgement and insight appear normal. Mood & affect appropriate.    Data Reviewed:    Labs: Basic Metabolic Panel:  Recent Labs Lab 09/09/16 0310 09/10/16 0323 09/11/16 0333 09/12/16 0336 09/13/16 0349  NA 142 139 135 136 137  K 3.1* 3.7 4.2 4.3 3.6  CL 92* 92* 93* 94* 89*  CO2 39* 37* 32 35* 38*  GLUCOSE 162* 168* 216* 174* 218*  BUN 12 27* 36* 35* 38*  CREATININE 0.73 1.18 1.15 1.04 1.09  CALCIUM 8.0* 8.0* 7.9* 8.1* 8.4*  MG 1.6*  --   --   --  2.3  PHOS 3.9  --   --   --   --    GFR Estimated Creatinine Clearance: 88.8 mL/min (by C-G formula based on SCr of 1.09 mg/dL). Liver Function Tests: No results for input(s): AST, ALT, ALKPHOS, BILITOT, PROT, ALBUMIN in the last 168 hours. No results for input(s): LIPASE, AMYLASE in the last 168 hours. No results for input(s): AMMONIA in the last 168 hours. Coagulation profile No results for input(s): INR, PROTIME in the last 168 hours.  CBC:  Recent Labs Lab 09/06/16 2245 09/07/16 0337 09/08/16 0324 09/09/16 0310  WBC 10.9* 10.4 9.3 8.8  NEUTROABS 8.3*  --   --   --   HGB 10.7* 11.0* 10.7* 10.7*  HCT 34.7* 36.3* 34.9* 34.3*  MCV 88.1 88.5 86.2 85.3  PLT 424* 459* 425* 393   Cardiac Enzymes:  Recent Labs Lab 09/06/16 0951  TROPONINI 0.04*   BNP (last 3 results) No results for input(s): PROBNP in the last 8760 hours. CBG:  Recent Labs Lab 09/12/16 0819 09/12/16 1319 09/12/16 1710 09/12/16 2101 09/13/16 0742  GLUCAP 169* 172* 254* 176* 165*   D-Dimer: No results for input(s): DDIMER in the last 72 hours. Hgb A1c: No results for input(s): HGBA1C in the last 72 hours. Lipid Profile: No results for input(s): CHOL, HDL, LDLCALC, TRIG, CHOLHDL, LDLDIRECT in the last 72 hours. Thyroid function studies: No results for input(s): TSH, T4TOTAL, T3FREE, THYROIDAB in the last 72 hours.  Invalid input(s): FREET3 Anemia work up: No results for input(s): VITAMINB12, FOLATE,  FERRITIN, TIBC, IRON, RETICCTPCT in the last 72 hours. Sepsis Labs:  Recent Labs Lab 09/06/16 2245 09/07/16 0337 09/08/16 0324 09/09/16 0310  WBC 10.9* 10.4 9.3 8.8   Microbiology Recent Results (from the past 240 hour(s))  MRSA PCR Screening     Status: None   Collection Time: 09/06/16  1:18 PM  Result Value Ref Range Status   MRSA by PCR NEGATIVE NEGATIVE Final    Comment:        The GeneXpert MRSA Assay (FDA approved for NASAL specimens only), is one component  of a comprehensive MRSA colonization surveillance program. It is not intended to diagnose MRSA infection nor to guide or monitor treatment for MRSA infections.      Medications:   . carvedilol  25 mg Oral BID WC  . digoxin  0.125 mg Oral Daily  . gabapentin  1,800 mg Oral QHS  . gabapentin  600 mg Oral Q lunch  . hydrALAZINE  25 mg Oral TID  . hydrocortisone cream   Topical BID  . insulin aspart  0-15 Units Subcutaneous TID WC  . insulin aspart  0-5 Units Subcutaneous QHS  . insulin detemir  30 Units Subcutaneous QPM  . ipratropium-albuterol  3 mL Nebulization QID  . methadone  10 mg Oral Q12H  . metolazone  5 mg Oral Daily  . metolazone  5 mg Oral Once  . potassium chloride  40 mEq Oral BID  . potassium chloride  40 mEq Oral BID  . protein supplement shake  11 oz Oral Q24H  . rivaroxaban  20 mg Per Tube Q supper  . rosuvastatin  20 mg Oral Daily  . sacubitril-valsartan  1 tablet Oral BID  . sodium chloride flush  3 mL Intravenous Q12H   Continuous Infusions: . furosemide (LASIX) infusion 8 mg/hr (09/12/16 1157)    Time spent: 25 min   LOS: 8 days   Charlynne Cousins  [facility] Pager [phone]  *Please refer to [email or web address], password [email or web address] to get updated schedule on who will round on this patient, as hospitalists switch teams weekly. If 7PM-7AM, please contact night-coverage at [email or web address], password [email or web address] for any overnight needs.  09/13/2016, 8:17 AM

## 2016-09-14 LAB — BASIC METABOLIC PANEL
Anion gap: 10 (ref 5–15)
BUN: 42 mg/dL — AB (ref 6–20)
CO2: 39 mmol/L — ABNORMAL HIGH (ref 22–32)
CREATININE: 1.1 mg/dL (ref 0.61–1.24)
Calcium: 8.5 mg/dL — ABNORMAL LOW (ref 8.9–10.3)
Chloride: 87 mmol/L — ABNORMAL LOW (ref 101–111)
GFR calc Af Amer: >60 mL/min (ref >60)
GLUCOSE: 197 mg/dL — AB (ref 65–99)
POTASSIUM: 3.3 mmol/L — AB (ref 3.5–5.1)
SODIUM: 136 mmol/L (ref 135–145)

## 2016-09-14 LAB — GLUCOSE, CAPILLARY
GLUCOSE-CAPILLARY: 163 mg/dL — AB (ref 65–99)
GLUCOSE-CAPILLARY: 181 mg/dL — AB (ref 65–99)
GLUCOSE-CAPILLARY: 302 mg/dL — AB (ref 65–99)
GLUCOSE-CAPILLARY: 152 mg/dL — AB (ref 65–99)

## 2016-09-14 MED ORDER — POTASSIUM CHLORIDE CRYS ER 20 MEQ PO TBCR
40.0000 meq | EXTENDED_RELEASE_TABLET | Freq: Two times a day (BID) | ORAL | Status: AC
  Administered 2016-09-14 (×2): 40 meq via ORAL
  Filled 2016-09-14 (×2): qty 2

## 2016-09-14 MED ORDER — METOLAZONE 2.5 MG PO TABS
2.5000 mg | ORAL_TABLET | Freq: Once | ORAL | Status: AC
  Administered 2016-09-14: 2.5 mg via ORAL
  Filled 2016-09-14: qty 1

## 2016-09-14 NOTE — Progress Notes (Addendum)
[facility] PROGRESS NOTE    Progress Note  Nicholas Caldwell  BXU:[id number] DOB: Aug 23, 1946 DOA: 09/05/2016 PCP: [facility]     Brief Narrative:   Nicholas Caldwell is an 70 y.o. male obese male with past medical history of COPD, nonischemic systolic heart failure, paroxysmal atrial fibrillation on anticoagulation, history of prolonged QT on methadone came into the hospital with acute pulmonary edema became lethargic and had to be intubated due to hypoxemia and lethargy and hypercarbia, intubated on 09/06/2016 extubated 09/10/2016, Likely due to a combination of COPD, obstructive sleep apnea and pulmonary edema. Now we have been diuresing him with IV Lasix with good response. His weight is trending down.  Assessment/Plan:   Acute on chronic respiratory failure with hypoxia (HCC)And hypercapnia due to acute decompensated diastolic heart failure/pulmonary edema: Estimated dry weight between 118 kg and 120. Brisk diuresis with IV Lasix infusion and metolazone. Will given potassium orally as it is trending down. Continue IV Lasix infusion And metolazone Continue strict I's and O's. Daily standing weights. Weight is trending down today is 123.3 kg Monitor electrolytes and blood pressure, restrict his fluids low sodium diet. Continue Coreg and Entresto. Restart Aldactone.  Chronic COPD: Continue current regimen no changes were made.   Essential hypertension Continue Entresto, Coreg and hydralazine. He will need a diuretic at discharge for home.  Hyperlipidemia: Continue statins.  PAF: CHA2DS2-VASc Score is 5: Controlled continuous Xarelto, Coreg and digoxin.  Morbid obesity ([place]) Counseling  Uncontrolled type 2 diabetes mellitus with complication (HCC) Continue long-acting insulin continue sliding scale insulin. Blood glucose improved today we'll continue current regimen.   Acute encephalopathy: Likely due to hypercarbia he was intubated on 09/06/2016, exudate  09/10/2016. No back to baseline.   DVT prophylaxis: apixiban Family Communication:wife Disposition Plan/Barrier to D/C: home on 11.13.2017 Code Status:     Code Status Orders        Start     Ordered   09/05/16 2120  Full code  Continuous     09/05/16 2122    Code Status History    Date Active Date Inactive Code Status Order ID Comments User Context   07/28/2016  2:16 AM 08/04/2016  5:33 PM Full Code [id number]  Toy Baker, MD Inpatient   06/30/2016  2:49 AM 07/04/2016  3:46 PM Full Code [id number]  Edwin Dada, MD Inpatient   05/25/2016 11:45 PM 05/26/2016  5:40 PM Full Code [id number]  Edwin Dada, MD Inpatient   02/19/2015  1:06 PM 02/23/2015  8:20 PM Full Code [id number]  Wilber Oliphant, MD ED   11/06/2014  4:38 PM 11/07/2014  3:35 PM Full Code [id number]  Jettie Booze, MD Inpatient   10/31/2014 11:14 PM 11/06/2014  4:38 PM Full Code [id number]  Allyne Gee, MD ED        IV Access:    Peripheral IV   Procedures and diagnostic studies:   No results found.   Medical Consultants:    None.  Anti-Infectives:   none  Subjective:    Nicholas Caldwell  patient is very defensive and combative but has no new complains. Patient is threatening to sue me if I send a copy of his discharge summary to his PCP.  Objective:    Vitals:   09/13/16 1433 09/13/16 1540 09/14/16 0638 09/14/16 0751  BP: 101/60  (!) 114/51   Pulse: 83  87   Resp: 20  20   Temp: 98.6 F (37 C)  98 F (36.7 C)  TempSrc: Oral  Oral   SpO2: 100% 99% 96% 95%  Weight:   123.4 kg (272 lb)   Height:        Intake/Output Summary (Last 24 hours) at 09/14/16 0827 Last data filed at 09/14/16 0600  Gross per 24 hour  Intake           907.57 ml  Output             3875 ml  Net         -2967.43 ml   Filed Weights   09/12/16 0618 09/13/16 0656 09/14/16 0638  Weight: 129 kg (284 lb 6.3 oz) 125.5 kg (276 lb 11.2 oz) 123.4 kg (272 lb)    Exam: General exam: In no acute  distress. Respiratory system: Good air movement and clear to auscultation. Cardiovascular system: S1 & S2 heard, RRR. -JVD Gastrointestinal system: Abdomen is nondistended, soft and nontender.  Extremities: No pedal edema. Skin: No rashes, lesions or ulcers Psychiatry: Judgement and insight appear normal. Mood & affect appropriate.    Data Reviewed:    Labs: Basic Metabolic Panel:  Recent Labs Lab 09/09/16 0310 09/10/16 0323 09/11/16 0333 09/12/16 0336 09/13/16 0349 09/14/16 0520  NA 142 139 135 136 137 136  K 3.1* 3.7 4.2 4.3 3.6 3.3*  CL 92* 92* 93* 94* 89* 87*  CO2 39* 37* 32 35* 38* 39*  GLUCOSE 162* 168* 216* 174* 218* 197*  BUN 12 27* 36* 35* 38* 42*  CREATININE 0.73 1.18 1.15 1.04 1.09 1.10  CALCIUM 8.0* 8.0* 7.9* 8.1* 8.4* 8.5*  MG 1.6*  --   --   --  2.3  --   PHOS 3.9  --   --   --   --   --    GFR Estimated Creatinine Clearance: 87.2 mL/min (by C-G formula based on SCr of 1.1 mg/dL). Liver Function Tests: No results for input(s): AST, ALT, ALKPHOS, BILITOT, PROT, ALBUMIN in the last 168 hours. No results for input(s): LIPASE, AMYLASE in the last 168 hours. No results for input(s): AMMONIA in the last 168 hours. Coagulation profile No results for input(s): INR, PROTIME in the last 168 hours.  CBC:  Recent Labs Lab 09/08/16 0324 09/09/16 0310  WBC 9.3 8.8  HGB 10.7* 10.7*  HCT 34.9* 34.3*  MCV 86.2 85.3  PLT 425* 393   Cardiac Enzymes: No results for input(s): CKTOTAL, CKMB, CKMBINDEX, TROPONINI in the last 168 hours. BNP (last 3 results) No results for input(s): PROBNP in the last 8760 hours. CBG:  Recent Labs Lab 09/13/16 0742 09/13/16 1139 09/13/16 1659 09/13/16 2124 09/14/16 0746  GLUCAP 165* 218* 180* 191* 163*   D-Dimer: No results for input(s): DDIMER in the last 72 hours. Hgb A1c: No results for input(s): HGBA1C in the last 72 hours. Lipid Profile: No results for input(s): CHOL, HDL, LDLCALC, TRIG, CHOLHDL, LDLDIRECT in the  last 72 hours. Thyroid function studies: No results for input(s): TSH, T4TOTAL, T3FREE, THYROIDAB in the last 72 hours.  Invalid input(s): FREET3 Anemia work up: No results for input(s): VITAMINB12, FOLATE, FERRITIN, TIBC, IRON, RETICCTPCT in the last 72 hours. Sepsis Labs:  Recent Labs Lab 09/08/16 0324 09/09/16 0310  WBC 9.3 8.8   Microbiology Recent Results (from the past 240 hour(s))  MRSA PCR Screening     Status: None   Collection Time: 09/06/16  1:18 PM  Result Value Ref Range Status   MRSA by PCR NEGATIVE NEGATIVE Final    Comment:  The GeneXpert MRSA Assay (FDA approved for NASAL specimens only), is one component of a comprehensive MRSA colonization surveillance program. It is not intended to diagnose MRSA infection nor to guide or monitor treatment for MRSA infections.      Medications:   . carvedilol  25 mg Oral BID WC  . digoxin  0.125 mg Oral Daily  . gabapentin  1,800 mg Oral QHS  . gabapentin  600 mg Oral Q lunch  . hydrocortisone cream   Topical BID  . insulin aspart  0-15 Units Subcutaneous TID WC  . insulin aspart  0-5 Units Subcutaneous QHS  . insulin detemir  20 Units Subcutaneous BID  . ipratropium-albuterol  3 mL Nebulization QID  . methadone  10 mg Oral Q12H  . potassium chloride  40 mEq Oral BID  . potassium chloride  40 mEq Oral BID  . protein supplement shake  11 oz Oral Q24H  . rivaroxaban  20 mg Per Tube Q supper  . rosuvastatin  20 mg Oral Daily  . sacubitril-valsartan  1 tablet Oral BID  . sodium chloride flush  3 mL Intravenous Q12H  . spironolactone  25 mg Oral Daily   Continuous Infusions: . furosemide (LASIX) infusion 10 mg/hr (09/14/16 0750)    Time spent: 25 min   LOS: 9 days   Charlynne Cousins  [facility] Pager [phone]  *Please refer to [email or web address], password [email or web address] to get updated schedule on who will round on this patient, as hospitalists switch teams weekly. If 7PM-7AM, please contact  night-coverage at [email or web address], password [email or web address] for any overnight needs.  09/14/2016, 8:27 AM

## 2016-09-15 LAB — GLUCOSE, CAPILLARY
GLUCOSE-CAPILLARY: 197 mg/dL — AB (ref 65–99)
GLUCOSE-CAPILLARY: 306 mg/dL — AB (ref 65–99)
Glucose-Capillary: 156 mg/dL — ABNORMAL HIGH (ref 65–99)
Glucose-Capillary: 265 mg/dL — ABNORMAL HIGH (ref 65–99)

## 2016-09-15 LAB — BASIC METABOLIC PANEL
Anion gap: 10 (ref 5–15)
BUN: 40 mg/dL — AB (ref 6–20)
CHLORIDE: 87 mmol/L — AB (ref 101–111)
CO2: 41 mmol/L — AB (ref 22–32)
CREATININE: 1.03 mg/dL (ref 0.61–1.24)
Calcium: 8.8 mg/dL — ABNORMAL LOW (ref 8.9–10.3)
GFR calc Af Amer: >60 mL/min (ref >60)
GFR calc non Af Amer: >60 mL/min (ref >60)
Glucose, Bld: 232 mg/dL — ABNORMAL HIGH (ref 65–99)
Potassium: 3.5 mmol/L (ref 3.5–5.1)
SODIUM: 138 mmol/L (ref 135–145)

## 2016-09-15 LAB — BRAIN NATRIURETIC PEPTIDE: B Natriuretic Peptide: 200.6 pg/mL — ABNORMAL HIGH (ref 0.0–100.0)

## 2016-09-15 MED ORDER — METOLAZONE 5 MG PO TABS
5.0000 mg | ORAL_TABLET | Freq: Once | ORAL | Status: AC
  Administered 2016-09-15: 5 mg via ORAL
  Filled 2016-09-15: qty 1

## 2016-09-15 MED ORDER — IPRATROPIUM-ALBUTEROL 0.5-2.5 (3) MG/3ML IN SOLN
3.0000 mL | Freq: Three times a day (TID) | RESPIRATORY_TRACT | Status: DC
  Administered 2016-09-15 (×2): 3 mL via RESPIRATORY_TRACT
  Filled 2016-09-15 (×3): qty 3

## 2016-09-15 NOTE — Progress Notes (Signed)
Physical Therapy Treatment Patient Details Name: Nicholas Caldwell MRN: [id number] DOB: 03/26/1946 Today's Date: 09/15/2016    History of Present Illness 70 y.o. male with medical history significant of non-ischemic cardiomyopathy, Chronic Systolic CHF EF 76-54%,  A-fib on Xarelto,Prolonged Q-T interval , HTN, HLD, COPD on 2 L O2 at home PRN,, Tobacco Abuse, DM Type 2 uncontrolled, chronic pain on Methadone who presents with SOB, LE edema.    PT Comments    Patient was seen in bed upon arrival with family present. Patient denied pain during session. Performed supine to sit x min guard requiring increased time and elevated HOB with hand held assist to raise trunk. Sit to stand x min guard with increased time required from a lower surface. Gait x 110 ft x min guard with VC's for LE placement within the walker to promote improved posture and safety awareness. SPO2 remained around 89%-91% on 2L of O2 during ambulation. The lowest O2 reading was 86% where patient took one standing break and practiced pursed lip breathing to improve O2 sats. HR max was 130 bpm during ambulation. Patient is limited by decreased activity tolerance and endurance. Nursing notified of O2 sats during treatment and urine output.   Follow Up Recommendations  Home health PT     Equipment Recommendations   (pt has walker at home. )    Recommendations for Other Services       Precautions / Restrictions Precautions Precautions: Fall Precaution Comments: monitor O2 Restrictions Weight Bearing Restrictions: No    Mobility  Bed Mobility Overal bed mobility: Needs Assistance Bed Mobility: Supine to Sit     Supine to sit: HOB elevated;Min guard     General bed mobility comments: elevated HOB, required hand held assist to lift trunk.  VC's required to scoot to EOB before standing.   Transfers Overall transfer level: Needs assistance Equipment used: Rolling walker (2 wheeled) Transfers: Sit to/from Stand Sit to Stand:  Min guard         General transfer comment: VC's required for safe UE placement. Increased time required from lower surface.   Ambulation/Gait Ambulation/Gait assistance: Min guard Ambulation Distance (Feet): 110 Feet Assistive device: Rolling walker (2 wheeled) Gait Pattern/deviations: Step-through pattern;Trunk flexed;Decreased step length - right;Decreased step length - left;Decreased stride length Gait velocity: decreased Gait velocity interpretation: Below normal speed for age/gender General Gait Details: HR 130 bpm max, O2 remained mostly 89%-91% on 2 L of O2. Lowest O2 was 86%.  VC's required for LE placement within the walker to promote improved posture.    Stairs            Wheelchair Mobility    Modified Rankin (Stroke Patients Only)       Balance                                    Cognition Arousal/Alertness: Awake/alert Behavior During Therapy: WFL for tasks assessed/performed Overall Cognitive Status: Within Functional Limits for tasks assessed                      Exercises      General Comments        Pertinent Vitals/Pain Pain Assessment: No/denies pain Faces Pain Scale: Hurts a little bit Pain Location: R foot Pain Descriptors / Indicators: Discomfort Pain Intervention(s): Limited activity within patient's tolerance;Monitored during session;Repositioned    Home Living  Prior Function            PT Goals (current goals can now be found in the care plan section) Progress towards PT goals: Progressing toward goals    Frequency    Min 3X/week      PT Plan Current plan remains appropriate    Co-evaluation             End of Session Equipment Utilized During Treatment: Gait belt;Oxygen Activity Tolerance: Patient tolerated treatment well;Patient limited by fatigue Patient left: in chair;with call bell/phone within reach     Time: 1152-1213 PT Time Calculation (min)  (ACUTE ONLY): 21 min  Charges:  $Gait Training: 8-22 mins                    G Codes:      Hall Busing, SPTA [facility] [phone]  Present and agree with above  Rica Koyanagi  PTA [facility] Pager      [phone]

## 2016-09-15 NOTE — Progress Notes (Addendum)
[facility] PROGRESS NOTE    Progress Note  Nicholas Caldwell  FMB:[id number] DOB: 1946-01-23 DOA: 09/05/2016 PCP: [facility]     Brief Narrative:   Nicholas Caldwell is an 70 y.o. male obese male with past medical history of COPD, nonischemic systolic heart failure, paroxysmal atrial fibrillation on anticoagulation, history of prolonged QT on methadone came into the hospital with acute pulmonary edema became lethargic and had to be intubated due to hypoxemia and lethargy and hypercarbia, intubated on 09/06/2016 extubated 09/10/2016, Likely due to a combination of COPD, obstructive sleep apnea and pulmonary edema. Now we have been diuresing him with IV Lasix with good response. His weight is trending down.  Assessment/Plan:   Acute on chronic respiratory failure with hypoxia (HCC)And hypercapnia due to acute decompensated diastolic heart failure/pulmonary edema: Estimated dry weight between 118 kg and 120. Minimal diuresis with IV Lasix infusion and metolazone. Will given potassium orally as it is trending down. Continue IV Lasix infusion  at a higher dose and  continue metolazone Continue strict I's and O's. Daily standing weights. Weight is trending down today is 123.3 kg, He continues to have crackles in her right lower lung Monitor electrolytes and blood pressure, restrict his fluids low sodium diet. Continue Coreg and Entresto and Aldactone. Patient is threatening to leave his medical advice. I have explained to him the risk and benefits of doing this. He seems to understand it and he relates to me that he was still like to leave the hospital today. Repeated bnp is 200.  Chronic COPD: Continue current regimen no changes were made.   Essential hypertension Continue Entresto, Coreg and hydralazine. He will need a diuretic at discharge for home.  Hyperlipidemia: Continue statins.  PAF: CHA2DS2-VASc Score is 5: Controlled continuous Xarelto, Coreg and digoxin.  Morbid  obesity ([place]) Counseling  Uncontrolled type 2 diabetes mellitus with complication (HCC) Continue long-acting insulin continue sliding scale insulin. Blood glucose improved today we'll continue current regimen.   Acute encephalopathy: Likely due to hypercarbia he was intubated on 09/06/2016, exudate 09/10/2016. No back to baseline.   DVT prophylaxis: apixiban Family Communication:wife Disposition Plan/Barrier to D/C: home in 1-2 days Code Status:     Code Status Orders        Start     Ordered   09/05/16 2120  Full code  Continuous     09/05/16 2122    Code Status History    Date Active Date Inactive Code Status Order ID Comments User Context   07/28/2016  2:16 AM 08/04/2016  5:33 PM Full Code [id number]  Toy Baker, MD Inpatient   06/30/2016  2:49 AM 07/04/2016  3:46 PM Full Code [id number]  Edwin Dada, MD Inpatient   05/25/2016 11:45 PM 05/26/2016  5:40 PM Full Code [id number]  Edwin Dada, MD Inpatient   02/19/2015  1:06 PM 02/23/2015  8:20 PM Full Code [id number]  Wilber Oliphant, MD ED   11/06/2014  4:38 PM 11/07/2014  3:35 PM Full Code [id number]  Jettie Booze, MD Inpatient   10/31/2014 11:14 PM 11/06/2014  4:38 PM Full Code [id number]  Allyne Gee, MD ED        IV Access:    Peripheral IV   Procedures and diagnostic studies:   No results found.   Medical Consultants:    None.  Anti-Infectives:   none  Subjective:    Nicholas Caldwell  patient is very defensive and combative, He is threatening to leave [place].  Objective:    Vitals:   09/14/16 2018 09/14/16 2025 09/15/16 0557 09/15/16 0558  BP: 117/61  (!) 115/57   Pulse: 70  81   Resp: 20  20   Temp: 98.6 F (37 C)  98.6 F (37 C)   TempSrc: Oral  Oral   SpO2: 95% 96% 98%   Weight:    123.4 kg (271 lb 15 oz)  Height:        Intake/Output Summary (Last 24 hours) at 09/15/16 1043 Last data filed at 09/15/16 1011  Gross per 24 hour  Intake            2129.1 ml  Output             3840 ml  Net          -1710.9 ml   Filed Weights   09/13/16 0656 09/14/16 0638 09/15/16 0558  Weight: 125.5 kg (276 lb 11.2 oz) 123.4 kg (272 lb) 123.4 kg (271 lb 15 oz)    Exam: General exam: In no acute distress. Respiratory system: Good air movement and crackles in the right lower lung. Cardiovascular system: S1 & S2 heard, RRR. -JVD Gastrointestinal system: Abdomen is nondistended, soft and nontender.  Extremities: No pedal edema. Skin: No rashes, lesions or ulcers Psychiatry: Judgement and insight appear normal. Mood & affect appropriate.    Data Reviewed:    Labs: Basic Metabolic Panel:  Recent Labs Lab 09/09/16 0310  09/11/16 0333 09/12/16 0336 09/13/16 0349 09/14/16 0520 09/15/16 0408  NA 142  < > 135 136 137 136 138  K 3.1*  < > 4.2 4.3 3.6 3.3* 3.5  CL 92*  < > 93* 94* 89* 87* 87*  CO2 39*  < > 32 35* 38* 39* 41*  GLUCOSE 162*  < > 216* 174* 218* 197* 232*  BUN 12  < > 36* 35* 38* 42* 40*  CREATININE 0.73  < > 1.15 1.04 1.09 1.10 1.03  CALCIUM 8.0*  < > 7.9* 8.1* 8.4* 8.5* 8.8*  MG 1.6*  --   --   --  2.3  --   --   PHOS 3.9  --   --   --   --   --   --   < > = values in this interval not displayed. GFR Estimated Creatinine Clearance: 93.1 mL/min (by C-G formula based on SCr of 1.03 mg/dL). Liver Function Tests: No results for input(s): AST, ALT, ALKPHOS, BILITOT, PROT, ALBUMIN in the last 168 hours. No results for input(s): LIPASE, AMYLASE in the last 168 hours. No results for input(s): AMMONIA in the last 168 hours. Coagulation profile No results for input(s): INR, PROTIME in the last 168 hours.  CBC:  Recent Labs Lab 09/09/16 0310  WBC 8.8  HGB 10.7*  HCT 34.3*  MCV 85.3  PLT 393   Cardiac Enzymes: No results for input(s): CKTOTAL, CKMB, CKMBINDEX, TROPONINI in the last 168 hours. BNP (last 3 results) No results for input(s): PROBNP in the last 8760 hours. CBG:  Recent Labs Lab 09/14/16 0746  09/14/16 1204 09/14/16 1708 09/14/16 2112 09/15/16 0746  GLUCAP 163* 181* 302* 152* 156*   D-Dimer: No results for input(s): DDIMER in the last 72 hours. Hgb A1c: No results for input(s): HGBA1C in the last 72 hours. Lipid Profile: No results for input(s): CHOL, HDL, LDLCALC, TRIG, CHOLHDL, LDLDIRECT in the last 72 hours. Thyroid function studies: No results for input(s): TSH, T4TOTAL, T3FREE, THYROIDAB in the last 72 hours.  Invalid input(s): FREET3 Anemia work up: No results for input(s): VITAMINB12, FOLATE, FERRITIN, TIBC, IRON, RETICCTPCT in the last 72 hours. Sepsis Labs:  Recent Labs Lab 09/09/16 0310  WBC 8.8   Microbiology Recent Results (from the past 240 hour(s))  MRSA PCR Screening     Status: None   Collection Time: 09/06/16  1:18 PM  Result Value Ref Range Status   MRSA by PCR NEGATIVE NEGATIVE Final    Comment:        The GeneXpert MRSA Assay (FDA approved for NASAL specimens only), is one component of a comprehensive MRSA colonization surveillance program. It is not intended to diagnose MRSA infection nor to guide or monitor treatment for MRSA infections.      Medications:   . carvedilol  25 mg Oral BID WC  . digoxin  0.125 mg Oral Daily  . gabapentin  1,800 mg Oral QHS  . gabapentin  600 mg Oral Q lunch  . hydrocortisone cream   Topical BID  . insulin aspart  0-15 Units Subcutaneous TID WC  . insulin aspart  0-5 Units Subcutaneous QHS  . insulin detemir  20 Units Subcutaneous BID  . ipratropium-albuterol  3 mL Nebulization QID  . methadone  10 mg Oral Q12H  . potassium chloride  40 mEq Oral BID  . protein supplement shake  11 oz Oral Q24H  . rivaroxaban  20 mg Per Tube Q supper  . rosuvastatin  20 mg Oral Daily  . sacubitril-valsartan  1 tablet Oral BID  . sodium chloride flush  3 mL Intravenous Q12H  . spironolactone  25 mg Oral Daily   Continuous Infusions: . furosemide (LASIX) infusion 14 mg/hr (09/15/16 1007)    Time spent: 25  min   LOS: 10 days   Charlynne Cousins  [facility] Pager [phone]  *Please refer to [email or web address], password [email or web address] to get updated schedule on who will round on this patient, as hospitalists switch teams weekly. If 7PM-7AM, please contact night-coverage at [email or web address], password [email or web address] for any overnight needs.  09/15/2016, 10:43 AM

## 2016-09-16 LAB — GLUCOSE, CAPILLARY
Glucose-Capillary: 172 mg/dL — ABNORMAL HIGH (ref 65–99)
Glucose-Capillary: 191 mg/dL — ABNORMAL HIGH (ref 65–99)

## 2016-09-16 LAB — BASIC METABOLIC PANEL
Anion gap: 13 (ref 5–15)
BUN: 49 mg/dL — AB (ref 6–20)
CO2: 40 mmol/L — ABNORMAL HIGH (ref 22–32)
CREATININE: 1.23 mg/dL (ref 0.61–1.24)
Calcium: 8.8 mg/dL — ABNORMAL LOW (ref 8.9–10.3)
Chloride: 83 mmol/L — ABNORMAL LOW (ref 101–111)
GFR calc Af Amer: >60 mL/min (ref >60)
GFR, EST NON AFRICAN AMERICAN: 59 mL/min — AB (ref >60)
GLUCOSE: 195 mg/dL — AB (ref 65–99)
POTASSIUM: 3.6 mmol/L (ref 3.5–5.1)
Sodium: 136 mmol/L (ref 135–145)

## 2016-09-16 MED ORDER — METOLAZONE 5 MG PO TABS
5.0000 mg | ORAL_TABLET | Freq: Once | ORAL | Status: AC
  Administered 2016-09-16: 5 mg via ORAL
  Filled 2016-09-16: qty 1

## 2016-09-16 MED ORDER — POTASSIUM CHLORIDE CRYS ER 20 MEQ PO TBCR
40.0000 meq | EXTENDED_RELEASE_TABLET | Freq: Two times a day (BID) | ORAL | Status: DC
  Administered 2016-09-16: 40 meq via ORAL
  Filled 2016-09-16: qty 2

## 2016-09-16 MED ORDER — FUROSEMIDE 10 MG/ML IJ SOLN
60.0000 mg | Freq: Once | INTRAMUSCULAR | Status: AC
  Administered 2016-09-16: 60 mg via INTRAVENOUS
  Filled 2016-09-16: qty 6

## 2016-09-16 MED ORDER — MAGNESIUM SULFATE 2 GM/50ML IV SOLN
2.0000 g | Freq: Once | INTRAVENOUS | Status: AC
Start: 2016-09-16 — End: 2016-09-16
  Administered 2016-09-16: 2 g via INTRAVENOUS
  Filled 2016-09-16: qty 50

## 2016-09-16 MED ORDER — FUROSEMIDE 40 MG PO TABS
80.0000 mg | ORAL_TABLET | Freq: Three times a day (TID) | ORAL | Status: DC
  Administered 2016-09-16: 80 mg via ORAL
  Filled 2016-09-16: qty 2

## 2016-09-16 NOTE — Progress Notes (Signed)
Nutrition Follow-up  DOCUMENTATION CODES:   Obesity unspecified  INTERVENTION:  Continue Premier Protein once daily, each supplement provides 160 kcal and 30 grams of protein.   Encouraged adequate intake of protein with meals and supplements.  Reviewed components of Heart Failure Nutrition Therapy with patient.    NUTRITION DIAGNOSIS:   Increased nutrient needs related to wound healing as evidenced by estimated needs.  Ongoing.  GOAL:   Patient will meet greater than or equal to 90% of their needs  Met.  MONITOR:   PO intake, Weight trends, Labs, Skin, I & O's  REASON FOR ASSESSMENT:   Malnutrition Screening Tool, Ventilator    ASSESSMENT:   70 yo obese man, hx OSA (not on CPAP), COPD, NICM with systolic CHF, parox A fib, hx prolonged QT-c. He was admitted 11/3 with acute dyspnea in setting B interstitial infiltrates on CXR. He was diuresed. On 11/4 he became lethargic, hypoxemic. ABG with hypercapnia. He moved to the ICu with severe lethargy to start BiPAP.  Some concern for possible CVA given the acute change in MS. He received methadone last pm, did not receive this 11/4. Intubated on 11/4 pm.   Spoke with patient at bedside. He reports appetite is good. Reviewed Heart Failure Nutrition Therapy with patient - he and his wife are following recommendations.  Meal Completion: 75-100% per chart and patient. In the past 24 hours patient has had 1869 kcal (98% minimum estimated kcal needs) and 82 grams protein (85% minimum estimated protein needs).   Medications reviewed and include: Lasix 80 mg TID, Novolog sliding scale TID with meals and daily at bedtime, Levemir 20 units BID, potassium chloride 40 mEq BID.  Labs reviewed: CBG 172-306, Chloride 83, CO2 40, BUN 49.   Weight trend: CBW 120.2 kg is -12.7 kg from admission, but expected with diuresis. Weight is similar to weight in 07/2016 prior to volume overload.   Diet Order:  Diet Carb Modified Fluid consistency:  Thin; Room service appropriate? Yes; Fluid restriction: 1500 mL Fluid Diet - low sodium heart healthy  Skin:  Wound (see comment) (Stage 3 L heel pressure injury)  Last BM:  09/15/2016  Height:   Ht Readings from Last 1 Encounters:  09/06/16 6' (1.829 m)    Weight:   Wt Readings from Last 1 Encounters:  09/16/16 264 lb 15.9 oz (120.2 kg)    Ideal Body Weight:  80.9 kg  BMI:  Body mass index is 35.94 kg/m.  Estimated Nutritional Needs:   Kcal:  1900-2150 (15-17 kcal/kg)  Protein:  97-112 grams (1.2-1.4 grams/kg IBW)  Fluid:  >/= 1.7 L/day  EDUCATION NEEDS:   No education needs identified at this time  Willey Blade, MS, RD, LDN Pager: [phone] After Hours Pager: [phone]

## 2016-09-16 NOTE — Progress Notes (Signed)
SATURATION QUALIFICATIONS: (This note is used to comply with regulatory documentation for home oxygen)  Patient Saturations on Room Air at Rest = 84%   Please briefly explain why patient needs home oxygen:

## 2016-09-16 NOTE — Care Management Important Message (Signed)
Important Message  Patient Details  Name: Nicholas Caldwell MRN: [id number] Date of Birth: [birth year]   Medicare Important Message Given:  Yes    Camillo Flaming 09/16/2016, 9:28 AMImportant Message  Patient Details  Name: Nicholas Caldwell MRN: [id number] Date of Birth: 21-Dec-1945   Medicare Important Message Given:  Yes    Camillo Flaming 09/16/2016, 9:28 AM

## 2016-09-16 NOTE — Discharge Summary (Signed)
Physician Discharge Summary  Bush Murdoch YWV:[id number] DOB: Aug 12, 1946 DOA: 09/05/2016  PCP: [place] date: 09/05/2016 Discharge date: 09/16/2016  Admitted From: home Disposition:  Home  Recommendations for Outpatient Follow-up:  1. Follow up with Cardiology in 1-2 weeks 2. Please obtain BMP/CBC in one week. 3. We'll have to counsel patient about compliance with diet and medication. 4. Patient will need a sleep study as an outpatient as he presently has obstructive sleep apnea.  Home Health:No Equipment/Devices:none  Discharge Condition:stable CODE STATUS:full Diet recommendation: Low sodium, fluid restriction  Brief/Interim Summary: 70 y.o. male obese male with past medical history of COPD, nonischemic systolic heart failure, paroxysmal atrial fibrillation on anticoagulation, history of prolonged QT on methadone came into the hospital with acute pulmonary edema became lethargic and had to be intubated due to hypoxemia and lethargy and hypercarbia, intubated on 09/06/2016 extubated 09/10/2016, Likely due to a combination of COPD, obstructive sleep apnea and pulmonary edema.   Discharge Diagnoses:  Principal Problem:   Acute on chronic respiratory failure with hypoxia (HCC) Active Problems:   Essential hypertension   Hyperlipidemia    COPD exacerbation (HCC)   Acute on chronic systolic CHF   Morbid obesity (HCC)   PAF (paroxysmal atrial fibrillation) (HCC)   CAD - Non-obstructive by LHC1/16   Uncontrolled type 2 diabetes mellitus with complication (HCC)   COPD (chronic obstructive pulmonary disease) (HCC)   Acute on chronic systolic heart failure (HCC)   CHF exacerbation (HCC)   Skin lesion-left heal   Pressure injury of skin  Acute on chronic respiratory failure with hypoxia (HCC)And hypercapnia due to acute decompensated diastolic heart failure/pulmonary edema: Estimated dry weight between 118 kg and 120. Minimal diuresis with IV Lasix infusion and  metolazone.  Continue strict I's and O's. Daily standing weights.  His weight on discharge is 120 kg. Electrolytes and blood pressure remain stable, restrict his fluids low sodium diet as an outpatient. Continue Coreg and Entresto and Aldactone. Repeated bnp is 200.  Chronic COPD: Continue current regimen no changes were made.   Essential hypertension Continue Entresto, Coreg and hydralazine. He will need a diuretic at discharge for home.  Hyperlipidemia: Continue statins.  PAF: CHA2DS2-VASc Scoreis 5: Controlled continuous Xarelto, Coreg and digoxin.  Morbid obesity ([place]) Counseling  Uncontrolled type 2 diabetes mellitus with complication (HCC) No changes made to his home regimen.  Acute encephalopathy: Likely due to hypercarbia he was intubated on 09/06/2016, exudate 09/10/2016. No back to baseline.   Discharge Instructions  Discharge Instructions    Diet - low sodium heart healthy    Complete by:  As directed    Increase activity slowly    Complete by:  As directed        Medication List    STOP taking these medications   hydrALAZINE 25 MG tablet Commonly known as:  APRESOLINE   isosorbide mononitrate 30 MG 24 hr tablet Commonly known as:  IMDUR   levalbuterol 0.63 MG/3ML nebulizer solution Commonly known as:  XOPENEX     TAKE these medications   albuterol 108 (90 Base) MCG/ACT inhaler Commonly known as:  PROVENTIL HFA;VENTOLIN HFA Inhale 2 puffs into the lungs every 6 (six) hours as needed for wheezing or shortness of breath.   carvedilol 25 MG tablet Commonly known as:  COREG Take 25 mg by mouth 2 (two) times daily with a meal.   diazepam 10 MG tablet Commonly known as:  VALIUM Take 0.5 tablets (5 mg total) by mouth daily as needed  for anxiety or sleep.   diclofenac sodium 1 % Gel Commonly known as:  VOLTAREN Apply 2 g topically 2 (two) times daily as needed (for pain).   digoxin 0.125 MG tablet Commonly known as:  LANOXIN Take 1  tablet (0.125 mg total) by mouth daily.   DULERA 200-5 MCG/ACT Aero Generic drug:  mometasone-formoterol Inhale 2 puffs into the lungs 2 (two) times daily.   fluticasone 50 MCG/ACT nasal spray Commonly known as:  FLONASE Place 2 sprays into both nostrils daily as needed for allergies.   furosemide 40 MG tablet Commonly known as:  LASIX Take 2 tablets (80 mg total) by mouth 3 (three) times daily.   gabapentin 600 MG tablet Commonly known as:  NEURONTIN Take 1-3 tablets (600-1,800 mg total) by mouth See admin instructions. Take 1 tablet (600 mg) by mouth daily with lunch,3 tablets (1800 mg) at bedtime   glipiZIDE 10 MG tablet Commonly known as:  GLUCOTROL Take 10 mg by mouth 2 (two) times daily before a meal.   glucose 4 GM chewable tablet Chew 4 tablets by mouth See admin instructions. Chew 4 tablets by mouth as needed for low blood sugar - repeat every 15 minutes if blood sugar less than 70   hydrocortisone 1 % lotion Apply 1 application topically See admin instructions. Apply small amount to back twice daily for itchy rash   hydrocortisone 2.5 % rectal cream Commonly known as:  ANUSOL-HC Place 1 application rectally 2 (two) times daily as needed for hemorrhoids or itching. Use up to 2 weeks at a time as needed   insulin aspart 100 UNIT/ML injection Commonly known as:  novoLOG CBG 70 - 120: 0 units CBG 121 - 150: 2 units CBG 151 - 200: 3 units CBG 201 - 250: 5 units CBG 251 - 300: 8 units CBG 301 - 350: 11 units CBG 351 - 400: 15 units What changed:  how much to take  how to take this  when to take this  reasons to take this  additional instructions   insulin detemir 100 UNIT/ML injection Commonly known as:  LEVEMIR Inject 0.3 mLs (30 Units total) into the skin every evening. What changed:  how much to take   ipratropium 0.02 % nebulizer solution Commonly known as:  ATROVENT Take 2.5 mLs (0.5 mg total) by nebulization every 6 (six) hours as needed for wheezing or  shortness of breath.   metFORMIN 500 MG 24 hr tablet Commonly known as:  GLUCOPHAGE-XR Take 1,000 mg by mouth 2 (two) times daily with a meal.   methadone 10 MG tablet Commonly known as:  DOLOPHINE Take 1 tablet (10 mg total) by mouth every 12 (twelve) hours.   metolazone 2.5 MG tablet Commonly known as:  ZAROXOLYN Take 1 tablet (2.5 mg total) by mouth as directed. 1 tablet every Tuesday 30 minutes before morning lasix What changed:  when to take this  additional instructions   potassium chloride SA 20 MEQ tablet Commonly known as:  K-DUR,KLOR-CON Take 2 tablets (40 mEq total) by mouth daily.   PRESCRIPTION MEDICATION Pt states he has been taking a antibiotic for the last few days, pt states it was for his cold, called his [facility] where he said he had it filled and they haven't filled it there, pt said he doesn't care if we clarify it or not, wont be bringing in medication   rivaroxaban 20 MG Tabs tablet Commonly known as:  XARELTO Take 1 tablet (20 mg total) by mouth daily  with supper.   rosuvastatin 40 MG tablet Commonly known as:  CRESTOR Take 20 mg by mouth daily. For cholesterol   sacubitril-valsartan 49-51 MG Commonly known as:  ENTRESTO Take 1 tablet by mouth 2 (two) times daily.   spironolactone 25 MG tablet Commonly known as:  ALDACTONE Take 1 tablet (25 mg total) by mouth daily.            Durable Medical Equipment        Start     Ordered   09/11/16 1349  For home use only DME Bedside commode  Once     09/11/16 1348   09/11/16 1349  For home use only DME Bedside commode  Once     09/11/16 1349   09/11/16 1347  For home use only DME Walker rolling  Once     09/11/16 1347      No Known Allergies  Consultations:  CCM   Procedures/Studies: Dg Chest 2 View  Result Date: 09/05/2016 CLINICAL DATA:  CHF, atrial flutter, coronary disease, COPD, non ischemic cardiomyopathy, diabetes mellitus, hypertension, paroxysmal atrial fibrillation,  smoker EXAM: CHEST  2 VIEW COMPARISON:  07/27/2016 FINDINGS: Enlargement of cardiac silhouette with pulmonary vascular congestion. Interstitial infiltrates likely interstitial pulmonary edema and CHF. RIGHT basilar pleural effusion atelectasis. No pneumothorax. Bones unremarkable. IMPRESSION: CHF with RIGHT pleural effusion. Electronically Signed   By: Lavonia Dana M.D.   On: 09/05/2016 19:22   Dg Chest Port 1 View  Result Date: 09/09/2016 CLINICAL DATA:  70 year old male with a history of respiratory failure EXAM: PORTABLE CHEST 1 VIEW COMPARISON:  09/08/2016, 09/07/2016 FINDINGS: Cardiomediastinal silhouette unchanged from prior with partial obscuration of the heart border secondary to overlying lung and pleural disease. Low lung volumes with mixed interstitial and airspace opacities bilaterally, predominantly lower lungs obscuring the hemidiaphragms. No pneumothorax. Interval extubation and removal of the gastric tube. IMPRESSION: Similar appearance of the lungs with evidence of bilateral small pleural effusions, atelectasis/ consolidation, and associated edema. Interval extubation and removal of the gastric tube. Signed, Dulcy Fanny. Earleen Newport, DO [facility] Electronically Signed   By: Corrie Mckusick D.O.   On: 09/09/2016 09:06   Dg Chest Port 1 View  Result Date: 09/08/2016 CLINICAL DATA:  Respiratory failure. EXAM: PORTABLE CHEST 1 VIEW COMPARISON:  09/07/2016. FINDINGS: Endotracheal tube and NG tube in stable position. Cardiomegaly with diffuse bilateral pulmonary infiltrates and small pleural effusions noted. Findings consistent with congestive heart failure. Similar findings noted on prior exam. IMPRESSION: 1. Lines and tubes in stable position. 2. Persistent congestive heart failure with bilateral pulmonary edema and bilateral effusions. No significant change. Electronically Signed   By: Marcello Moores  Register   On: 09/08/2016 07:01   Dg Chest Port 1  View  Result Date: 09/07/2016 CLINICAL DATA:  Acute respiratory failure EXAM: PORTABLE CHEST 1 VIEW COMPARISON:  09/06/2016 FINDINGS: Cardiac shadow is again enlarged. An endotracheal tube and nasogastric catheter are again noted in satisfactory position. Diffuse vascular congestion is noted. Right-sided pleural effusion is noted. No focal confluent infiltrate is seen although likely right basilar atelectasis is present. No bony abnormality is noted. IMPRESSION: Congestive failure and right pleural effusion. Electronically Signed   By: Inez Catalina M.D.   On: 09/07/2016 07:18   Dg Chest Port 1 View  Result Date: 09/06/2016 CLINICAL DATA:  Initial evaluation for endotracheal tube placement. EXAM: PORTABLE CHEST 1 VIEW COMPARISON:  Prior radiograph from earlier the same day. FINDINGS: Study is limited as the patient is  markedly rotated to the right. Patient is now intubated with an endotracheal tube in place. Tip of the tube is positioned approximately 2.5 cm above the carina. Enteric tube courses in the the abdomen. Cardiomegaly grossly stable. Changes related congestive heart failure again noted, relatively stable. No pneumothorax. Osseous structures unchanged. IMPRESSION: 1. Tip of the endotracheal tube approximately 2.5 cm above the carina. 2. Little interval change in appearance of the chest with cardiomegaly and findings consistent with congestive heart failure. Electronically Signed   By: Jeannine Boga M.D.   On: 09/06/2016 18:19   Dg Chest Port 1 View  Result Date: 09/06/2016 CLINICAL DATA:  Compromised respiration EXAM: PORTABLE CHEST 1 VIEW COMPARISON:  Chest radiograph from one day prior. FINDINGS: Stable cardiomediastinal silhouette with cardiomegaly. No pneumothorax. Stable small right pleural effusion. Moderate pulmonary edema appears slightly worsened. Bibasilar lung opacities appear stable. IMPRESSION: 1. Moderate congestive heart failure, slightly worsened. 2. Stable small right  pleural effusion. 3. Stable bibasilar lung opacities, favor atelectasis. Electronically Signed   By: Ilona Sorrel M.D.   On: 09/06/2016 13:02   Dg Abd Portable 1v  Result Date: 09/06/2016 CLINICAL DATA:  NG tube placement. EXAM: PORTABLE ABDOMEN - 1 VIEW COMPARISON:  Itch chest radiograph of earlier today. FINDINGS: Nasogastric terminates at the body of the stomach. Side port is not well visualized. No gross bowel obstruction Cardiomegaly. IMPRESSION: Nasogastric at the body of the stomach. Consider minimal of enhancement. Electronically Signed   By: Abigail Miyamoto M.D.   On: 09/06/2016 18:18      Subjective:  patient does not want to speak to me.   Discharge Exam: Vitals:   09/16/16 0528 09/16/16 1018  BP: (!) 107/46   Pulse: 74 80  Resp: 18   Temp: 97.9 F (36.6 C)    Vitals:   09/15/16 2031 09/15/16 2121 09/16/16 0528 09/16/16 1018  BP:  (!) 111/55 (!) 107/46   Pulse:  84 74 80  Resp:  18 18   Temp:  98.6 F (37 C) 97.9 F (36.6 C)   TempSrc:  Oral Oral   SpO2: 98% 93%    Weight:   120.2 kg (264 lb 15.9 oz)   Height:        General: Pt is alert, awake, not in acute distress Cardiovascular: RRR, S1/S2 +, no rubs, no gallops, -JVD. Respiratory: CTA bilaterally, no wheezing, no rhonchi Abdominal: Soft, NT, ND, bowel sounds + Extremities: no edema, no cyanosis    The results of significant diagnostics from this hospitalization (including imaging, microbiology, ancillary and laboratory) are listed below for reference.     Microbiology: Recent Results (from the past 240 hour(s))  MRSA PCR Screening     Status: None   Collection Time: 09/06/16  1:18 PM  Result Value Ref Range Status   MRSA by PCR NEGATIVE NEGATIVE Final    Comment:        The GeneXpert MRSA Assay (FDA approved for NASAL specimens only), is one component of a comprehensive MRSA colonization surveillance program. It is not intended to diagnose MRSA infection nor to guide or monitor treatment  for MRSA infections.      Labs: BNP (last 3 results)  Recent Labs  07/27/16 2324 09/05/16 1936 09/15/16 0408  BNP 414.0* 531.0* 850.2*   Basic Metabolic Panel:  Recent Labs Lab 09/12/16 0336 09/13/16 0349 09/14/16 0520 09/15/16 0408 09/16/16 0338  NA 136 137 136 138 136  K 4.3 3.6 3.3* 3.5 3.6  CL 94* 89* 87* 87*  83*  CO2 35* 38* 39* 41* 40*  GLUCOSE 174* 218* 197* 232* 195*  BUN 35* 38* 42* 40* 49*  CREATININE 1.04 1.09 1.10 1.03 1.23  CALCIUM 8.1* 8.4* 8.5* 8.8* 8.8*  MG  --  2.3  --   --   --    Liver Function Tests: No results for input(s): AST, ALT, ALKPHOS, BILITOT, PROT, ALBUMIN in the last 168 hours. No results for input(s): LIPASE, AMYLASE in the last 168 hours. No results for input(s): AMMONIA in the last 168 hours. CBC: No results for input(s): WBC, NEUTROABS, HGB, HCT, MCV, PLT in the last 168 hours. Cardiac Enzymes: No results for input(s): CKTOTAL, CKMB, CKMBINDEX, TROPONINI in the last 168 hours. BNP: Invalid input(s): POCBNP CBG:  Recent Labs Lab 09/15/16 0746 09/15/16 1136 09/15/16 1649 09/15/16 2135 09/16/16 0932  GLUCAP 156* 265* 197* 306* 172*   D-Dimer No results for input(s): DDIMER in the last 72 hours. Hgb A1c No results for input(s): HGBA1C in the last 72 hours. Lipid Profile No results for input(s): CHOL, HDL, LDLCALC, TRIG, CHOLHDL, LDLDIRECT in the last 72 hours. Thyroid function studies No results for input(s): TSH, T4TOTAL, T3FREE, THYROIDAB in the last 72 hours.  Invalid input(s): FREET3 Anemia work up No results for input(s): VITAMINB12, FOLATE, FERRITIN, TIBC, IRON, RETICCTPCT in the last 72 hours. Urinalysis    Component Value Date/Time   COLORURINE YELLOW 10/26/2014 2206   APPEARANCEUR CLEAR 10/26/2014 2206   LABSPEC 1.017 10/26/2014 2206   PHURINE 6.0 10/26/2014 2206   GLUCOSEU NEGATIVE 10/26/2014 2206   HGBUR NEGATIVE 10/26/2014 2206   BILIRUBINUR NEGATIVE 10/26/2014 2206   KETONESUR NEGATIVE 10/26/2014  2206   PROTEINUR NEGATIVE 10/26/2014 2206   UROBILINOGEN 0.2 10/26/2014 2206   NITRITE NEGATIVE 10/26/2014 2206   LEUKOCYTESUR NEGATIVE 10/26/2014 2206   Sepsis Labs Invalid input(s): PROCALCITONIN,  WBC,  LACTICIDVEN Microbiology Recent Results (from the past 240 hour(s))  MRSA PCR Screening     Status: None   Collection Time: 09/06/16  1:18 PM  Result Value Ref Range Status   MRSA by PCR NEGATIVE NEGATIVE Final    Comment:        The GeneXpert MRSA Assay (FDA approved for NASAL specimens only), is one component of a comprehensive MRSA colonization surveillance program. It is not intended to diagnose MRSA infection nor to guide or monitor treatment for MRSA infections.      Time coordinating discharge: Over 30 minutes  SIGNED:   Charlynne Cousins, MD  [facility] 09/16/2016, 10:26 AM Pager   If 7PM-7AM, please contact night-coverage [email or web address] Password [email or web address]

## 2016-09-16 NOTE — Progress Notes (Signed)
Pt discharging home with [place] with home O2/HHRN/PT.

## 2016-09-17 DIAGNOSIS — Z9981 Dependence on supplemental oxygen: Secondary | ICD-10-CM | POA: Diagnosis not present

## 2016-09-17 DIAGNOSIS — Z79891 Long term (current) use of opiate analgesic: Secondary | ICD-10-CM | POA: Diagnosis not present

## 2016-09-17 DIAGNOSIS — E1165 Type 2 diabetes mellitus with hyperglycemia: Secondary | ICD-10-CM | POA: Diagnosis not present

## 2016-09-17 DIAGNOSIS — I5023 Acute on chronic systolic (congestive) heart failure: Secondary | ICD-10-CM | POA: Diagnosis not present

## 2016-09-17 DIAGNOSIS — J441 Chronic obstructive pulmonary disease with (acute) exacerbation: Secondary | ICD-10-CM | POA: Diagnosis not present

## 2016-09-17 DIAGNOSIS — I480 Paroxysmal atrial fibrillation: Secondary | ICD-10-CM | POA: Diagnosis not present

## 2016-09-17 DIAGNOSIS — Z7901 Long term (current) use of anticoagulants: Secondary | ICD-10-CM | POA: Diagnosis not present

## 2016-09-17 DIAGNOSIS — I272 Pulmonary hypertension, unspecified: Secondary | ICD-10-CM | POA: Diagnosis not present

## 2016-09-17 DIAGNOSIS — I2510 Atherosclerotic heart disease of native coronary artery without angina pectoris: Secondary | ICD-10-CM | POA: Diagnosis not present

## 2016-09-17 DIAGNOSIS — J9621 Acute and chronic respiratory failure with hypoxia: Secondary | ICD-10-CM | POA: Diagnosis not present

## 2016-09-17 DIAGNOSIS — Z720 Tobacco use: Secondary | ICD-10-CM | POA: Diagnosis not present

## 2016-09-17 DIAGNOSIS — L89622 Pressure ulcer of left heel, stage 2: Secondary | ICD-10-CM | POA: Diagnosis not present

## 2016-09-17 DIAGNOSIS — Z7951 Long term (current) use of inhaled steroids: Secondary | ICD-10-CM | POA: Diagnosis not present

## 2016-09-17 DIAGNOSIS — Z794 Long term (current) use of insulin: Secondary | ICD-10-CM | POA: Diagnosis not present

## 2016-09-17 DIAGNOSIS — I110 Hypertensive heart disease with heart failure: Secondary | ICD-10-CM | POA: Diagnosis not present

## 2016-09-18 NOTE — Patient Outreach (Signed)
Received voicemail message from Mrs. Righi requesting call. Returned telephone call and spoke with Mrs. Noralee Chars who states she was [employer] to ask about patient's nurse coming out. Discussed that Mr. Baney declined [place] [facility] during hospital bedside encounter. Mrs. Hoeffner reports she was speaking of [facility] and that the [facility] nurse came to the house yesterday. No [facility] Care Management follow up at this time as Mrs. Aldridge was confused about who she was calling. No interest was expressed for [facility] Care Management services.    Marthenia Rolling, MSN-Ed, RN,BSN [facility] Liaison [phone]

## 2016-09-19 DIAGNOSIS — J441 Chronic obstructive pulmonary disease with (acute) exacerbation: Secondary | ICD-10-CM | POA: Diagnosis not present

## 2016-09-19 DIAGNOSIS — J9621 Acute and chronic respiratory failure with hypoxia: Secondary | ICD-10-CM | POA: Diagnosis not present

## 2016-09-19 DIAGNOSIS — E1165 Type 2 diabetes mellitus with hyperglycemia: Secondary | ICD-10-CM | POA: Diagnosis not present

## 2016-09-19 DIAGNOSIS — I5023 Acute on chronic systolic (congestive) heart failure: Secondary | ICD-10-CM | POA: Diagnosis not present

## 2016-09-19 DIAGNOSIS — L89622 Pressure ulcer of left heel, stage 2: Secondary | ICD-10-CM | POA: Diagnosis not present

## 2016-09-19 DIAGNOSIS — I110 Hypertensive heart disease with heart failure: Secondary | ICD-10-CM | POA: Diagnosis not present

## 2016-10-01 DIAGNOSIS — G894 Chronic pain syndrome: Secondary | ICD-10-CM | POA: Diagnosis not present

## 2016-10-01 DIAGNOSIS — Z79899 Other long term (current) drug therapy: Secondary | ICD-10-CM | POA: Diagnosis not present

## 2016-10-01 DIAGNOSIS — M545 Low back pain: Secondary | ICD-10-CM | POA: Diagnosis not present

## 2016-10-01 DIAGNOSIS — M25559 Pain in unspecified hip: Secondary | ICD-10-CM | POA: Diagnosis not present

## 2016-10-01 DIAGNOSIS — M792 Neuralgia and neuritis, unspecified: Secondary | ICD-10-CM | POA: Diagnosis not present

## 2016-10-01 DIAGNOSIS — Z79891 Long term (current) use of opiate analgesic: Secondary | ICD-10-CM | POA: Diagnosis not present

## 2016-10-19 NOTE — Progress Notes (Deleted)
Cardiology Office Note   Date:  10/19/2016   ID:  Nicholas Caldwell, DOB Mar 06, 1946, MRN [id number]  PCP:  [facility]    No chief complaint on file.    Wt Readings from Last 3 Encounters:  09/16/16 265 lb (120.2 kg)  08/18/16 292 lb (132.5 kg)  08/04/16 285 lb 7.9 oz (129.5 kg)       History of Present Illness: Nicholas Caldwell is a 70 y.o. male  with a hx of nonischemic cardiomyopathy dx 0/93, systolic HF, AFib/Flutter, HTN, HL, diabetes, tobacco abuse. Cardiac catheterization January 2016 demonstrated mild nonobstructive CAD.   Admitted in 2/67 with a/c systolic HF and elevated Troponin levels 2/2 demand ischemia in the setting of atrial fibrillation/flutter with RVR. CHADS2-VASc=4. He was placed on Xarelto for anticoagulation. He was seen by EP and was not felt to be a candidate for flecainide secondary cardiomyopathy. He was not felt to be a candidate for Tikosyn given his prolonged QT. Success of ablation would be limited due to left atrial size. He was screened for Genetic AF and was not felt to be a good candidate. It was ultimately decided to place him on amiodarone for rhythm control.   He has had transient episodes of AFlutter noted in the office in the past. I have adjusted his Amiodarone. I had him FU with EP to see if he can be considered for RFCA as his arrhythmia may be contributing to his DCM. He saw Dr. Rayann Heman 10/01/15. Apparently he declined ICD at that time.   In 3/17, he was again back in AFlutter in the office. Dr. Rayann Heman felt the patient would not benefit from RFCA of AFlutter as this would likely result in more AFib. He felt like the patient should be continued on rate control Rx. I saw him last in 5/17.  He was volume overloaded and I adjusted his diuretics.  Since I last saw him, he has been admitted x 3. Admitted in 7/17 for AECOPD. He was then admitted 8/27-9/1 with a/c hypoxemic hypercarbic respiratory failure felt to be 2/2 a/c CHF and  AECOPD. Of note, his Amiodarone was DC'd 2/2 prolonged QT during this admission.  He FU in our clinic with [name], PA-C on 9/19.  He was volume overloaded.  It was questioned if he was adherent with his diuretics and the dose was adjusted with plans for close FU.  He was then admitted 1/24-58/0 with a/c systolic CHF in the setting of AECOPD with assoc AF with RVR.  He was followed by Cardiology.  He diuresed > 25L.  DC weight was 285 lbs (notes indicate questionable accuracy with his weights).  He was tx with rate control for his AFib.  He returns for close post hospital FU.  Of note, [facility] has tried unsuccessfully x 3 to FU with him since DC.    He was referred to the CHF clinic but missed the appointment.    Past Medical History:  Diagnosis Date  . Atrial flutter ([place])    a. recurrent AFlutter with RVR;  b. Amiodarone Rx started 4/16  . CAD (coronary artery disease)    a. LHC 1/16:  mLAD diffuse disease, pLCx mild disease, dLCx with disease but too small for PCI, RCA ok, EF 25-30%  . Chronic pain   . Chronic systolic CHF (congestive heart failure) ([place])   . COPD (chronic obstructive pulmonary disease) ([place])   . Diabetes mellitus without complication ([place])   . Hypercholesteremia   .  Hypertension   . NICM (nonischemic cardiomyopathy) ([place])    a.dx 2016. b. 2D echo 06/2016 - Last echo 07/01/16: mod dilated LV, mod LVH, EF 25-30%, mild-mod MR, sev LAE, mild-mod reduced RV systolic function, mild-mod TR, PASP 47mHG.  .Marland KitchenPAF (paroxysmal atrial fibrillation) (H[facility])    On amio - ot a candidate for flecainide due to cardiomyopathy, not a candidate for Tikosyn due to prolonged QT, and felt to be a poor candidate for ablation given left atrial size.  . Prolonged Q-T interval on ECG   . Pulmonary hypertension   . Tobacco abuse     Past Surgical History:  Procedure Laterality Date  . LEFT AND RIGHT HEART CATHETERIZATION WITH CORONARY ANGIOGRAM N/A 11/06/2014   Procedure: LEFT AND  RIGHT HEART CATHETERIZATION WITH CORONARY ANGIOGRAM;  Surgeon: JJettie Booze MD;  [facility];  Service: Cardiovascular;  Laterality: N/A;  . SPLENECTOMY       Current Outpatient Prescriptions  Medication Sig Dispense Refill  . albuterol (PROVENTIL HFA;VENTOLIN HFA) 108 (90 BASE) MCG/ACT inhaler Inhale 2 puffs into the lungs every 6 (six) hours as needed for wheezing or shortness of breath. 1 Inhaler 2  . carvedilol (COREG) 25 MG tablet Take 25 mg by mouth 2 (two) times daily with a meal.    . diazepam (VALIUM) 10 MG tablet Take 0.5 tablets (5 mg total) by mouth daily as needed for anxiety or sleep. 30 tablet 0  . diclofenac sodium (VOLTAREN) 1 % GEL Apply 2 g topically 2 (two) times daily as needed (for pain).    .Marland Kitchendigoxin (LANOXIN) 0.125 MG tablet Take 1 tablet (0.125 mg total) by mouth daily. 30 tablet 11  . fluticasone (FLONASE) 50 MCG/ACT nasal spray Place 2 sprays into both nostrils daily as needed for allergies.     . furosemide (LASIX) 40 MG tablet Take 2 tablets (80 mg total) by mouth 3 (three) times daily. 90 tablet 0  . gabapentin (NEURONTIN) 600 MG tablet Take 1-3 tablets (600-1,800 mg total) by mouth See admin instructions. Take 1 tablet (600 mg) by mouth daily with lunch,3 tablets (1800 mg) at bedtime 30 tablet 0  . glipiZIDE (GLUCOTROL) 10 MG tablet Take 10 mg by mouth 2 (two) times daily before a meal.     . glucose 4 GM chewable tablet Chew 4 tablets by mouth See admin instructions. Chew 4 tablets by mouth as needed for low blood sugar - repeat every 15 minutes if blood sugar less than 70    . hydrocortisone (ANUSOL-HC) 2.5 % rectal cream Place 1 application rectally 2 (two) times daily as needed for hemorrhoids or itching. Use up to 2 weeks at a time as needed    . hydrocortisone 1 % lotion Apply 1 application topically See admin instructions. Apply small amount to back twice daily for itchy rash    . insulin aspart (NOVOLOG) 100 UNIT/ML injection CBG 70 - 120: 0  units CBG 121 - 150: 2 units CBG 151 - 200: 3 units CBG 201 - 250: 5 units CBG 251 - 300: 8 units CBG 301 - 350: 11 units CBG 351 - 400: 15 units (Patient taking differently: Inject 0-15 Units into the skin 3 (three) times daily as needed for high blood sugar. CBG 70 - 120: 0 units; CBG 121 - 150: 2 units; CBG 151 - 200: 3 units; CBG 201 - 250: 5 units; CBG 251 - 300: 8 units; CBG 301 - 350: 11 units; CBG 351 - 400: 15  units) 10 mL 11  . insulin detemir (LEVEMIR) 100 UNIT/ML injection Inject 0.3 mLs (30 Units total) into the skin every evening. (Patient taking differently: Inject 44 Units into the skin every evening. ) 10 mL 11  . ipratropium (ATROVENT) 0.02 % nebulizer solution Take 2.5 mLs (0.5 mg total) by nebulization every 6 (six) hours as needed for wheezing or shortness of breath. 75 mL 12  . metFORMIN (GLUCOPHAGE-XR) 500 MG 24 hr tablet Take 1,000 mg by mouth 2 (two) times daily with a meal.    . methadone (DOLOPHINE) 10 MG tablet Take 1 tablet (10 mg total) by mouth every 12 (twelve) hours. 1 tablet 0  . metolazone (ZAROXOLYN) 2.5 MG tablet Take 1 tablet (2.5 mg total) by mouth as directed. 1 tablet every Tuesday 30 minutes before morning lasix (Patient taking differently: Take 2.5 mg by mouth every Tuesday. 1 tablet every Tuesday 30 minutes before morning lasix) 10 tablet 1  . mometasone-formoterol (DULERA) 200-5 MCG/ACT AERO Inhale 2 puffs into the lungs 2 (two) times daily.    . potassium chloride SA (K-DUR,KLOR-CON) 20 MEQ tablet Take 2 tablets (40 mEq total) by mouth daily. 30 tablet 0  . PRESCRIPTION MEDICATION Pt states he has been taking a antibiotic for the last few days, pt states it was for his cold, called his [facility] where he said he had it filled and they haven't filled it there, pt said he doesn't care if we clarify it or not, wont be bringing in medication    . rivaroxaban (XARELTO) 20 MG TABS tablet Take 1 tablet (20 mg total) by mouth daily with supper. 30 tablet 0  .  rosuvastatin (CRESTOR) 40 MG tablet Take 20 mg by mouth daily. For cholesterol    . sacubitril-valsartan (ENTRESTO) 49-51 MG Take 1 tablet by mouth 2 (two) times daily. 60 tablet 11  . spironolactone (ALDACTONE) 25 MG tablet Take 1 tablet (25 mg total) by mouth daily. 30 tablet 11   No current facility-administered medications for this visit.     Allergies:   Patient has no known allergies.    Social History:  The patient  reports that he has been smoking Cigarettes.  He has been smoking about 0.05 packs per day. He has never used smokeless tobacco. He reports that he does not drink alcohol or use drugs.   Family History:  The patient's ***family history includes Heart disease in his father and mother; Heart failure in his mother; Hypertension in his mother and sister.    ROS:  Please see the history of present illness.   Otherwise, review of systems are positive for ***.   All other systems are reviewed and negative.    PHYSICAL EXAM: VS:  There were no vitals taken for this visit. , BMI There is no height or weight on file to calculate BMI. GEN: Well nourished, well developed, in no acute distress  HEENT: normal  Neck: no JVD, carotid bruits, or masses Cardiac: ***RRR; no murmurs, rubs, or gallops,no edema  Respiratory:  clear to auscultation bilaterally, normal work of breathing GI: soft, nontender, nondistended, + BS MS: no deformity or atrophy  Skin: warm and dry, no rash Neuro:  Strength and sensation are intact Psych: euthymic mood, full affect   EKG:   The ekg ordered today demonstrates ***   Recent Labs: 07/28/2016: ALT 22; TSH 1.745 09/09/2016: Hemoglobin 10.7; Platelets 393 09/13/2016: Magnesium 2.3 09/15/2016: B Natriuretic Peptide 200.6 09/16/2016: BUN 49; Creatinine, Ser 1.23; Potassium 3.6; Sodium 136  Lipid Panel    Component Value Date/Time   CHOL 147 11/01/2014 0216   TRIG 134 11/01/2014 0216   HDL 48 11/01/2014 0216   CHOLHDL 3.1 11/01/2014 0216    VLDL 27 11/01/2014 0216   LDLCALC 72 11/01/2014 0216     Other studies Reviewed: Additional studies/ records that were reviewed today with results demonstrating: ***.   ASSESSMENT AND PLAN:  1. Chronic systolic heart failure 2. NICM 3. CAD 4. AFib/AFlutter 5. HTN   Current medicines are reviewed at length with the patient today.  The patient concerns regarding his medicines were addressed.  The following changes have been made:  No change***  Labs/ tests ordered today include: *** No orders of the defined types were placed in this encounter.   Recommend 150 minutes/week of aerobic exercise Low fat, low carb, high fiber diet recommended  Disposition:   FU in ***   Signed, Larae Grooms, MD  10/19/2016 10:04 PM    [facility] [address], [place], [place]  [address] Phone: [phone]; Fax: [phone]

## 2016-10-20 ENCOUNTER — Ambulatory Visit: Payer: Medicare Other | Admitting: Interventional Cardiology

## 2016-10-28 DIAGNOSIS — R0602 Shortness of breath: Secondary | ICD-10-CM | POA: Diagnosis not present

## 2016-10-29 DIAGNOSIS — I110 Hypertensive heart disease with heart failure: Secondary | ICD-10-CM | POA: Diagnosis not present

## 2016-10-29 DIAGNOSIS — E1165 Type 2 diabetes mellitus with hyperglycemia: Secondary | ICD-10-CM | POA: Diagnosis not present

## 2016-10-29 DIAGNOSIS — I5023 Acute on chronic systolic (congestive) heart failure: Secondary | ICD-10-CM | POA: Diagnosis not present

## 2016-10-29 DIAGNOSIS — J441 Chronic obstructive pulmonary disease with (acute) exacerbation: Secondary | ICD-10-CM | POA: Diagnosis not present

## 2016-10-29 DIAGNOSIS — J9621 Acute and chronic respiratory failure with hypoxia: Secondary | ICD-10-CM | POA: Diagnosis not present

## 2016-10-29 DIAGNOSIS — L89622 Pressure ulcer of left heel, stage 2: Secondary | ICD-10-CM | POA: Diagnosis not present

## 2016-10-30 ENCOUNTER — Telehealth: Payer: Self-pay | Admitting: Interventional Cardiology

## 2016-10-30 ENCOUNTER — Inpatient Hospital Stay (HOSPITAL_COMMUNITY)
Admission: EM | Admit: 2016-10-30 | Discharge: 2016-11-05 | DRG: 205 | Disposition: A | Discharge status: Home / Self Care | Payer: Medicare Other | Attending: Internal Medicine | Admitting: Internal Medicine

## 2016-10-30 ENCOUNTER — Emergency Department (HOSPITAL_COMMUNITY): Payer: Medicare Other

## 2016-10-30 ENCOUNTER — Encounter (HOSPITAL_COMMUNITY): Payer: Self-pay | Admitting: *Deleted

## 2016-10-30 DIAGNOSIS — J9602 Acute respiratory failure with hypercapnia: Secondary | ICD-10-CM | POA: Diagnosis not present

## 2016-10-30 DIAGNOSIS — Z9981 Dependence on supplemental oxygen: Secondary | ICD-10-CM

## 2016-10-30 DIAGNOSIS — G8929 Other chronic pain: Secondary | ICD-10-CM | POA: Diagnosis present

## 2016-10-30 DIAGNOSIS — I480 Paroxysmal atrial fibrillation: Secondary | ICD-10-CM

## 2016-10-30 DIAGNOSIS — E1165 Type 2 diabetes mellitus with hyperglycemia: Secondary | ICD-10-CM | POA: Diagnosis present

## 2016-10-30 DIAGNOSIS — I4892 Unspecified atrial flutter: Secondary | ICD-10-CM | POA: Diagnosis present

## 2016-10-30 DIAGNOSIS — R069 Unspecified abnormalities of breathing: Secondary | ICD-10-CM | POA: Diagnosis not present

## 2016-10-30 DIAGNOSIS — I428 Other cardiomyopathies: Secondary | ICD-10-CM | POA: Diagnosis present

## 2016-10-30 DIAGNOSIS — G9340 Encephalopathy, unspecified: Secondary | ICD-10-CM | POA: Diagnosis present

## 2016-10-30 DIAGNOSIS — R4182 Altered mental status, unspecified: Secondary | ICD-10-CM | POA: Diagnosis not present

## 2016-10-30 DIAGNOSIS — E7800 Pure hypercholesterolemia, unspecified: Secondary | ICD-10-CM | POA: Diagnosis present

## 2016-10-30 DIAGNOSIS — Z9081 Acquired absence of spleen: Secondary | ICD-10-CM | POA: Diagnosis not present

## 2016-10-30 DIAGNOSIS — Z8249 Family history of ischemic heart disease and other diseases of the circulatory system: Secondary | ICD-10-CM | POA: Diagnosis not present

## 2016-10-30 DIAGNOSIS — I2510 Atherosclerotic heart disease of native coronary artery without angina pectoris: Secondary | ICD-10-CM | POA: Diagnosis present

## 2016-10-30 DIAGNOSIS — Z7901 Long term (current) use of anticoagulants: Secondary | ICD-10-CM | POA: Diagnosis not present

## 2016-10-30 DIAGNOSIS — E785 Hyperlipidemia, unspecified: Secondary | ICD-10-CM | POA: Diagnosis present

## 2016-10-30 DIAGNOSIS — I509 Heart failure, unspecified: Secondary | ICD-10-CM | POA: Diagnosis not present

## 2016-10-30 DIAGNOSIS — I5043 Acute on chronic combined systolic (congestive) and diastolic (congestive) heart failure: Secondary | ICD-10-CM | POA: Diagnosis present

## 2016-10-30 DIAGNOSIS — Z9119 Patient's noncompliance with other medical treatment and regimen: Secondary | ICD-10-CM

## 2016-10-30 DIAGNOSIS — Z91199 Patient's noncompliance with other medical treatment and regimen due to unspecified reason: Secondary | ICD-10-CM

## 2016-10-30 DIAGNOSIS — R0602 Shortness of breath: Secondary | ICD-10-CM | POA: Diagnosis not present

## 2016-10-30 DIAGNOSIS — J449 Chronic obstructive pulmonary disease, unspecified: Secondary | ICD-10-CM | POA: Diagnosis not present

## 2016-10-30 DIAGNOSIS — J9601 Acute respiratory failure with hypoxia: Secondary | ICD-10-CM | POA: Diagnosis not present

## 2016-10-30 DIAGNOSIS — J9621 Acute and chronic respiratory failure with hypoxia: Secondary | ICD-10-CM | POA: Diagnosis not present

## 2016-10-30 DIAGNOSIS — J9620 Acute and chronic respiratory failure, unspecified whether with hypoxia or hypercapnia: Secondary | ICD-10-CM | POA: Diagnosis not present

## 2016-10-30 DIAGNOSIS — G4733 Obstructive sleep apnea (adult) (pediatric): Secondary | ICD-10-CM

## 2016-10-30 DIAGNOSIS — J9622 Acute and chronic respiratory failure with hypercapnia: Secondary | ICD-10-CM | POA: Diagnosis present

## 2016-10-30 DIAGNOSIS — I110 Hypertensive heart disease with heart failure: Secondary | ICD-10-CM | POA: Diagnosis present

## 2016-10-30 DIAGNOSIS — T59811A Toxic effect of smoke, accidental (unintentional), initial encounter: Secondary | ICD-10-CM | POA: Diagnosis present

## 2016-10-30 DIAGNOSIS — F17210 Nicotine dependence, cigarettes, uncomplicated: Secondary | ICD-10-CM | POA: Diagnosis present

## 2016-10-30 DIAGNOSIS — Z79891 Long term (current) use of opiate analgesic: Secondary | ICD-10-CM | POA: Diagnosis not present

## 2016-10-30 DIAGNOSIS — I5023 Acute on chronic systolic (congestive) heart failure: Secondary | ICD-10-CM | POA: Diagnosis not present

## 2016-10-30 DIAGNOSIS — Z794 Long term (current) use of insulin: Secondary | ICD-10-CM | POA: Diagnosis not present

## 2016-10-30 DIAGNOSIS — J431 Panlobular emphysema: Secondary | ICD-10-CM

## 2016-10-30 DIAGNOSIS — J705 Respiratory conditions due to smoke inhalation: Principal | ICD-10-CM | POA: Diagnosis present

## 2016-10-30 DIAGNOSIS — J9600 Acute respiratory failure, unspecified whether with hypoxia or hypercapnia: Secondary | ICD-10-CM | POA: Diagnosis present

## 2016-10-30 DIAGNOSIS — I272 Pulmonary hypertension, unspecified: Secondary | ICD-10-CM

## 2016-10-30 DIAGNOSIS — Z7951 Long term (current) use of inhaled steroids: Secondary | ICD-10-CM

## 2016-10-30 DIAGNOSIS — R410 Disorientation, unspecified: Secondary | ICD-10-CM

## 2016-10-30 DIAGNOSIS — E876 Hypokalemia: Secondary | ICD-10-CM | POA: Diagnosis not present

## 2016-10-30 DIAGNOSIS — Z9114 Patient's other noncompliance with medication regimen: Secondary | ICD-10-CM

## 2016-10-30 DIAGNOSIS — I4891 Unspecified atrial fibrillation: Secondary | ICD-10-CM | POA: Diagnosis not present

## 2016-10-30 DIAGNOSIS — J9690 Respiratory failure, unspecified, unspecified whether with hypoxia or hypercapnia: Secondary | ICD-10-CM

## 2016-10-30 LAB — CBC
HCT: 37.2 % — ABNORMAL LOW (ref 39.0–52.0)
HCT: 37.9 % — ABNORMAL LOW (ref 39.0–52.0)
Hemoglobin: 11.4 g/dL — ABNORMAL LOW (ref 13.0–17.0)
Hemoglobin: 11.1 g/dL — ABNORMAL LOW (ref 13.0–17.0)
MCH: 26.7 pg (ref 26.0–34.0)
MCH: 26 pg (ref 26.0–34.0)
MCHC: 30.6 g/dL (ref 30.0–36.0)
MCHC: 29.3 g/dL — AB (ref 30.0–36.0)
MCV: 87.1 fL (ref 78.0–100.0)
MCV: 88.8 fL (ref 78.0–100.0)
PLATELETS: 432 10*3/uL — AB (ref 150–400)
PLATELETS: 388 10*3/uL (ref 150–400)
RBC: 4.27 MIL/uL (ref 4.22–5.81)
RBC: 4.27 MIL/uL (ref 4.22–5.81)
RDW: 16.6 % — ABNORMAL HIGH (ref 11.5–15.5)
RDW: 16.7 % — AB (ref 11.5–15.5)
WBC: 8.8 10*3/uL (ref 4.0–10.5)
WBC: 9.4 10*3/uL (ref 4.0–10.5)

## 2016-10-30 LAB — PROCALCITONIN

## 2016-10-30 LAB — BLOOD GAS, ARTERIAL
Acid-Base Excess: 13 mmol/L — ABNORMAL HIGH (ref 0.0–2.0)
BICARBONATE: 39.9 mmol/L — AB (ref 20.0–28.0)
Delivery systems: POSITIVE
Drawn by: 25788
EXPIRATORY PAP: 10
FIO2: 40
Inspiratory PAP: 20
O2 SAT: 95.7 %
PATIENT TEMPERATURE: 98.6
PO2 ART: 86.2 mmHg (ref 83.0–108.0)
pCO2 arterial: 85.6 mmHg (ref 32.0–48.0)
pH, Arterial: 7.29 — ABNORMAL LOW (ref 7.350–7.450)

## 2016-10-30 LAB — RESPIRATORY PANEL BY PCR
ADENOVIRUS-RVPPCR: NOT DETECTED
BORDETELLA PERTUSSIS-RVPCR: NOT DETECTED
CHLAMYDOPHILA PNEUMONIAE-RVPPCR: NOT DETECTED
CORONAVIRUS HKU1-RVPPCR: NOT DETECTED
CORONAVIRUS NL63-RVPPCR: NOT DETECTED
Coronavirus 229E: NOT DETECTED
Coronavirus OC43: NOT DETECTED
Influenza A: NOT DETECTED
Influenza B: NOT DETECTED
MYCOPLASMA PNEUMONIAE-RVPPCR: NOT DETECTED
Metapneumovirus: NOT DETECTED
Parainfluenza Virus 1: NOT DETECTED
Parainfluenza Virus 2: NOT DETECTED
Parainfluenza Virus 3: NOT DETECTED
Parainfluenza Virus 4: NOT DETECTED
Respiratory Syncytial Virus: NOT DETECTED
Rhinovirus / Enterovirus: NOT DETECTED

## 2016-10-30 LAB — I-STAT ARTERIAL BLOOD GAS, ED
Acid-Base Excess: 9 mmol/L — ABNORMAL HIGH (ref 0.0–2.0)
Acid-Base Excess: 8 mmol/L — ABNORMAL HIGH (ref 0.0–2.0)
Acid-Base Excess: 10 mmol/L — ABNORMAL HIGH (ref 0.0–2.0)
BICARBONATE: 39.6 mmol/L — AB (ref 20.0–28.0)
BICARBONATE: 40.1 mmol/L — AB (ref 20.0–28.0)
Bicarbonate: 40.5 mmol/L — ABNORMAL HIGH (ref 20.0–28.0)
O2 SAT: 85 %
O2 SAT: 90 %
O2 Saturation: 94 %
PCO2 ART: 92.9 mmHg — AB (ref 32.0–48.0)
PCO2 ART: 92.4 mmHg — AB (ref 32.0–48.0)
PH ART: 7.238 — AB (ref 7.350–7.450)
PO2 ART: 62 mmHg — AB (ref 83.0–108.0)
PO2 ART: 73 mmHg — AB (ref 83.0–108.0)
Patient temperature: 98.6
Patient temperature: 99.1
TCO2: 42 mmol/L (ref 0–100)
TCO2: 43 mmol/L (ref 0–100)
TCO2: 43 mmol/L (ref 0–100)
pH, Arterial: 7.21 — ABNORMAL LOW (ref 7.350–7.450)
pH, Arterial: 7.252 — ABNORMAL LOW (ref 7.350–7.450)
pO2, Arterial: 95 mmHg (ref 83.0–108.0)

## 2016-10-30 LAB — GLUCOSE, CAPILLARY
GLUCOSE-CAPILLARY: 165 mg/dL — AB (ref 65–99)
GLUCOSE-CAPILLARY: 119 mg/dL — AB (ref 65–99)
Glucose-Capillary: 138 mg/dL — ABNORMAL HIGH (ref 65–99)
Glucose-Capillary: 98 mg/dL (ref 65–99)
Glucose-Capillary: 122 mg/dL — ABNORMAL HIGH (ref 65–99)

## 2016-10-30 LAB — INFLUENZA PANEL BY PCR (TYPE A & B)
INFLAPCR: NEGATIVE
INFLBPCR: NEGATIVE

## 2016-10-30 LAB — CREATININE, SERUM
Creatinine, Ser: 0.67 mg/dL (ref 0.61–1.24)
GFR calc non Af Amer: >60 mL/min (ref >60)

## 2016-10-30 LAB — TROPONIN I
TROPONIN I: 0.04 ng/mL — AB (ref <0.03)
TROPONIN I: 0.05 ng/mL — AB (ref <0.03)
Troponin I: 0.04 ng/mL (ref <0.03)

## 2016-10-30 LAB — CK: Total CK: 35 U/L — ABNORMAL LOW (ref 49–397)

## 2016-10-30 LAB — BRAIN NATRIURETIC PEPTIDE
B NATRIURETIC PEPTIDE 5: 594.4 pg/mL — AB (ref 0.0–100.0)
B Natriuretic Peptide: 773.2 pg/mL — ABNORMAL HIGH (ref 0.0–100.0)

## 2016-10-30 LAB — I-STAT TROPONIN, ED: Troponin i, poc: 0.03 ng/mL (ref 0.00–0.08)

## 2016-10-30 LAB — RAPID URINE DRUG SCREEN, HOSP PERFORMED
Amphetamines: NOT DETECTED
BENZODIAZEPINES: POSITIVE — AB
Barbiturates: NOT DETECTED
Cocaine: NOT DETECTED
OPIATES: NOT DETECTED
Tetrahydrocannabinol: NOT DETECTED

## 2016-10-30 LAB — COOXEMETRY PANEL
CARBOXYHEMOGLOBIN: 1.6 % — AB (ref 0.5–1.5)
METHEMOGLOBIN: 0.9 % (ref 0.0–1.5)
O2 Saturation: 98.3 %
Total hemoglobin: 11.6 g/dL — ABNORMAL LOW (ref 12.0–16.0)

## 2016-10-30 LAB — BASIC METABOLIC PANEL
ANION GAP: 9 (ref 5–15)
BUN: 7 mg/dL (ref 6–20)
CO2: 32 mmol/L (ref 22–32)
Calcium: 8.8 mg/dL — ABNORMAL LOW (ref 8.9–10.3)
Chloride: 97 mmol/L — ABNORMAL LOW (ref 101–111)
Creatinine, Ser: 0.65 mg/dL (ref 0.61–1.24)
GFR calc Af Amer: >60 mL/min (ref >60)
GLUCOSE: 242 mg/dL — AB (ref 65–99)
POTASSIUM: 3.7 mmol/L (ref 3.5–5.1)
Sodium: 138 mmol/L (ref 135–145)

## 2016-10-30 LAB — ETHANOL: Alcohol, Ethyl (B): <5 mg/dL (ref <5)

## 2016-10-30 LAB — DIGOXIN LEVEL

## 2016-10-30 LAB — LACTIC ACID, PLASMA
Lactic Acid, Venous: 1.7 mmol/L (ref 0.5–1.9)
Lactic Acid, Venous: 1.8 mmol/L (ref 0.5–1.9)

## 2016-10-30 LAB — URINALYSIS, ROUTINE W REFLEX MICROSCOPIC
BILIRUBIN URINE: NEGATIVE
Glucose, UA: NEGATIVE mg/dL
HGB URINE DIPSTICK: NEGATIVE
KETONES UR: NEGATIVE mg/dL
Leukocytes, UA: NEGATIVE
Nitrite: NEGATIVE
PROTEIN: NEGATIVE mg/dL
Specific Gravity, Urine: 1.005 (ref 1.005–1.030)
pH: 5 (ref 5.0–8.0)

## 2016-10-30 LAB — MRSA PCR SCREENING: MRSA BY PCR: NEGATIVE

## 2016-10-30 MED ORDER — MIDAZOLAM HCL 2 MG/2ML IJ SOLN
INTRAMUSCULAR | Status: AC
  Filled 2016-10-30: qty 2

## 2016-10-30 MED ORDER — ASPIRIN 300 MG RE SUPP
300.0000 mg | RECTAL | Status: AC
  Administered 2016-10-30: 300 mg via RECTAL
  Filled 2016-10-30: qty 1

## 2016-10-30 MED ORDER — ALBUTEROL SULFATE (2.5 MG/3ML) 0.083% IN NEBU
5.0000 mg | INHALATION_SOLUTION | Freq: Once | RESPIRATORY_TRACT | Status: AC
  Administered 2016-10-30: 5 mg via RESPIRATORY_TRACT
  Filled 2016-10-30: qty 6

## 2016-10-30 MED ORDER — CHLORHEXIDINE GLUCONATE 0.12 % MT SOLN
15.0000 mL | Freq: Two times a day (BID) | OROMUCOSAL | Status: DC
  Administered 2016-10-30 – 2016-10-31 (×2): 15 mL via OROMUCOSAL
  Filled 2016-10-30: qty 15

## 2016-10-30 MED ORDER — DILTIAZEM HCL 100 MG IV SOLR
5.0000 mg/h | INTRAVENOUS | Status: DC
  Administered 2016-10-30: 5 mg/h via INTRAVENOUS
  Administered 2016-10-30: 10 mg/h via INTRAVENOUS
  Administered 2016-10-31: 5 mg/h via INTRAVENOUS
  Filled 2016-10-30 (×3): qty 100

## 2016-10-30 MED ORDER — FUROSEMIDE 10 MG/ML IJ SOLN
60.0000 mg | Freq: Once | INTRAMUSCULAR | Status: AC
  Administered 2016-10-30: 60 mg via INTRAVENOUS
  Filled 2016-10-30: qty 6

## 2016-10-30 MED ORDER — FUROSEMIDE 10 MG/ML IJ SOLN
120.0000 mg | Freq: Four times a day (QID) | INTRAVENOUS | Status: DC
  Administered 2016-10-30 – 2016-10-31 (×5): 120 mg via INTRAVENOUS
  Filled 2016-10-30 (×7): qty 12

## 2016-10-30 MED ORDER — IPRATROPIUM BROMIDE 0.02 % IN SOLN
0.5000 mg | Freq: Once | RESPIRATORY_TRACT | Status: AC
  Administered 2016-10-30: 0.5 mg via RESPIRATORY_TRACT
  Filled 2016-10-30: qty 2.5

## 2016-10-30 MED ORDER — ENOXAPARIN SODIUM 40 MG/0.4ML ~~LOC~~ SOLN
40.0000 mg | Freq: Every day | SUBCUTANEOUS | Status: DC
  Administered 2016-10-30 – 2016-10-31 (×2): 40 mg via SUBCUTANEOUS
  Filled 2016-10-30 (×2): qty 0.4

## 2016-10-30 MED ORDER — IPRATROPIUM-ALBUTEROL 0.5-2.5 (3) MG/3ML IN SOLN: 3.0000 mL | Freq: Four times a day (QID) | RESPIRATORY_TRACT | Status: DC | PRN

## 2016-10-30 MED ORDER — SODIUM CHLORIDE 0.9 % IV BOLUS (SEPSIS)
250.0000 mL | Freq: Once | INTRAVENOUS | Status: AC
  Administered 2016-10-30: 250 mL via INTRAVENOUS

## 2016-10-30 MED ORDER — DILTIAZEM LOAD VIA INFUSION
15.0000 mg | Freq: Once | INTRAVENOUS | Status: AC
  Administered 2016-10-30: 15 mg via INTRAVENOUS
  Filled 2016-10-30: qty 15

## 2016-10-30 MED ORDER — ORAL CARE MOUTH RINSE: 15.0000 mL | Freq: Two times a day (BID) | OROMUCOSAL | Status: DC

## 2016-10-30 MED ORDER — FENTANYL CITRATE (PF) 100 MCG/2ML IJ SOLN
INTRAMUSCULAR | Status: AC
  Filled 2016-10-30: qty 2

## 2016-10-30 MED ORDER — ASPIRIN 81 MG PO CHEW: 324.0000 mg | CHEWABLE_TABLET | ORAL | Status: AC

## 2016-10-30 MED ORDER — INSULIN ASPART 100 UNIT/ML ~~LOC~~ SOLN
0.0000 [IU] | SUBCUTANEOUS | Status: DC
  Administered 2016-10-30 (×2): 2 [IU] via SUBCUTANEOUS
  Administered 2016-10-30: 3 [IU] via SUBCUTANEOUS
  Administered 2016-10-31: 10 [IU] via SUBCUTANEOUS
  Administered 2016-11-01: 2 [IU] via SUBCUTANEOUS
  Administered 2016-11-01: 5 [IU] via SUBCUTANEOUS
  Administered 2016-11-01: 2 [IU] via SUBCUTANEOUS
  Administered 2016-11-01: 3 [IU] via SUBCUTANEOUS
  Administered 2016-11-01: 5 [IU] via SUBCUTANEOUS

## 2016-10-30 MED ORDER — SODIUM CHLORIDE 0.9 % IV SOLN: 250.0000 mL | INTRAVENOUS | Status: DC | PRN

## 2016-10-30 NOTE — Telephone Encounter (Signed)
lmtcb

## 2016-10-30 NOTE — ED Provider Notes (Signed)
Patient seen by PCCM who believes he is okay for stepdown with the hospitalist team for CHF exacerbation  PCCM recommending 147m IV lasix  Please see pccm consult note for complete details   KJola Schmidt MD 10/30/16 [phone]

## 2016-10-30 NOTE — ED Notes (Signed)
Lasix given
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
## 2016-10-30 NOTE — ED Notes (Signed)
Critical care here seeing pt

## 2016-10-30 NOTE — ED Notes (Signed)
Pt asleep
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
## 2016-10-30 NOTE — Telephone Encounter (Signed)
Nicholas Caldwell is returning your call. Please call at [phone]. Thanks.

## 2016-10-30 NOTE — ED Notes (Signed)
The pt has been given cardizem and his rate has slowed  He remains sleeping  His family reports that he sleeps deeply  And  Is hard to arrouse  Then suddenly wakes up
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
## 2016-10-30 NOTE — Telephone Encounter (Signed)
New Message  Nicholas Caldwell, pt home health Nurse call requesting to speak with RN about pt having an abnormal heart rate. I did make an appt for pt on 1/2 with PA. Please call back to discuss

## 2016-10-30 NOTE — Telephone Encounter (Signed)
Mailbox is full and cannot accept messages at this time.  Patient is currently admitted to [facility] with ARF.

## 2016-10-30 NOTE — ED Notes (Signed)
Ems gave the pt a hhn on the way in tom the ed and his sats improved with that

## 2016-10-30 NOTE — Progress Notes (Signed)
CRITICAL VALUE ALERT  Critical value received:  Troponin 0.04   Date of notification:  10/30/2016   Time of notification:  1010  Critical value read back: yes   Nurse who received alert:  Rick Duff   MD notified (1st page):  Nelda Marseille   Time of first page:  1015   MD notified (2nd page):  Time of second page:  Responding MD:  Nelda Marseille   Time MD responded:  1021
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
## 2016-10-30 NOTE — ED Notes (Signed)
Attempted report 

## 2016-10-30 NOTE — Progress Notes (Signed)
10/30/2016 1115 Dr. Nelda Marseille on floor and made aware of ABG results. Verbal order to retrieve RSI kit and prepare for intubation. Orders enacted. RT notified. 44 Dr. Nelda Marseille made aware materials at bedside for intubation, verbal order to hold until further evaluation from MD can be made. Pt. Stable on BIPAP at this time.  RT notified. RSI kit contents wasted in sink and witnessed by Rick Duff, RN. Will continue to closely monitor patient.  Oretha Weismann, Arville Lime
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
## 2016-10-31 ENCOUNTER — Other Ambulatory Visit (HOSPITAL_COMMUNITY): Payer: Medicare Other

## 2016-10-31 ENCOUNTER — Inpatient Hospital Stay (HOSPITAL_COMMUNITY): Payer: Medicare Other

## 2016-10-31 DIAGNOSIS — G9340 Encephalopathy, unspecified: Secondary | ICD-10-CM

## 2016-10-31 DIAGNOSIS — J9602 Acute respiratory failure with hypercapnia: Secondary | ICD-10-CM

## 2016-10-31 DIAGNOSIS — I4891 Unspecified atrial fibrillation: Secondary | ICD-10-CM

## 2016-10-31 DIAGNOSIS — J9601 Acute respiratory failure with hypoxia: Secondary | ICD-10-CM

## 2016-10-31 DIAGNOSIS — I10 Essential (primary) hypertension: Secondary | ICD-10-CM

## 2016-10-31 LAB — BLOOD GAS, ARTERIAL
ACID-BASE EXCESS: 18.9 mmol/L — AB (ref 0.0–2.0)
Bicarbonate: 45 mmol/L — ABNORMAL HIGH (ref 20.0–28.0)
Delivery systems: POSITIVE
Drawn by: 441661
FIO2: 40
LHR: 18 {breaths}/min
Mode: POSITIVE
O2 SAT: 97 %
PATIENT TEMPERATURE: 99.3
PCO2 ART: 75.8 mmHg — AB (ref 32.0–48.0)
PEEP/CPAP: 10 cmH2O
PO2 ART: 96.7 mmHg (ref 83.0–108.0)
pH, Arterial: 7.393 (ref 7.350–7.450)

## 2016-10-31 LAB — GLUCOSE, CAPILLARY
GLUCOSE-CAPILLARY: 108 mg/dL — AB (ref 65–99)
GLUCOSE-CAPILLARY: 120 mg/dL — AB (ref 65–99)
GLUCOSE-CAPILLARY: 262 mg/dL — AB (ref 65–99)
GLUCOSE-CAPILLARY: 93 mg/dL (ref 65–99)
GLUCOSE-CAPILLARY: 219 mg/dL — AB (ref 65–99)
Glucose-Capillary: 119 mg/dL — ABNORMAL HIGH (ref 65–99)

## 2016-10-31 LAB — BASIC METABOLIC PANEL
ANION GAP: 9 (ref 5–15)
BUN: <5 mg/dL — ABNORMAL LOW (ref 6–20)
CHLORIDE: 92 mmol/L — AB (ref 101–111)
CO2: 42 mmol/L — AB (ref 22–32)
Calcium: 8.2 mg/dL — ABNORMAL LOW (ref 8.9–10.3)
Creatinine, Ser: 0.73 mg/dL (ref 0.61–1.24)
GFR calc non Af Amer: >60 mL/min (ref >60)
Glucose, Bld: 98 mg/dL (ref 65–99)
POTASSIUM: 3.4 mmol/L — AB (ref 3.5–5.1)
Sodium: 143 mmol/L (ref 135–145)

## 2016-10-31 LAB — MAGNESIUM: Magnesium: 1.4 mg/dL — ABNORMAL LOW (ref 1.7–2.4)

## 2016-10-31 LAB — CBC
HEMATOCRIT: 35.1 % — AB (ref 39.0–52.0)
HEMOGLOBIN: 10.3 g/dL — AB (ref 13.0–17.0)
MCH: 25.9 pg — ABNORMAL LOW (ref 26.0–34.0)
MCHC: 29.3 g/dL — AB (ref 30.0–36.0)
MCV: 88.2 fL (ref 78.0–100.0)
Platelets: 402 10*3/uL — ABNORMAL HIGH (ref 150–400)
RBC: 3.98 MIL/uL — ABNORMAL LOW (ref 4.22–5.81)
RDW: 16.6 % — ABNORMAL HIGH (ref 11.5–15.5)
WBC: 8 10*3/uL (ref 4.0–10.5)

## 2016-10-31 LAB — PHOSPHORUS: PHOSPHORUS: 4.3 mg/dL (ref 2.5–4.6)

## 2016-10-31 LAB — DIGOXIN LEVEL

## 2016-10-31 MED ORDER — MOMETASONE FURO-FORMOTEROL FUM 200-5 MCG/ACT IN AERO
2.0000 | INHALATION_SPRAY | Freq: Two times a day (BID) | RESPIRATORY_TRACT | Status: DC
Start: 2016-10-31 — End: 2016-11-05
  Administered 2016-10-31 – 2016-11-05 (×7): 2 via RESPIRATORY_TRACT
  Filled 2016-10-31 (×2): qty 8.8

## 2016-10-31 MED ORDER — CARVEDILOL 25 MG PO TABS
25.0000 mg | ORAL_TABLET | Freq: Two times a day (BID) | ORAL | Status: DC
  Administered 2016-10-31: 25 mg via ORAL
  Filled 2016-10-31 (×2): qty 1

## 2016-10-31 MED ORDER — METHADONE HCL 10 MG PO TABS: 5.0000 mg | ORAL_TABLET | Freq: Two times a day (BID) | ORAL | Status: DC

## 2016-10-31 MED ORDER — OXYCODONE HCL 5 MG PO TABS
5.0000 mg | ORAL_TABLET | Freq: Four times a day (QID) | ORAL | Status: DC | PRN
  Administered 2016-10-31 – 2016-11-05 (×15): 5 mg via ORAL
  Filled 2016-10-31 (×15): qty 1

## 2016-10-31 MED ORDER — DIAZEPAM 2 MG PO TABS
2.0000 mg | ORAL_TABLET | Freq: Every day | ORAL | Status: DC | PRN
  Administered 2016-11-02: 2 mg via ORAL
  Filled 2016-10-31: qty 1

## 2016-10-31 MED ORDER — FUROSEMIDE 80 MG PO TABS
80.0000 mg | ORAL_TABLET | Freq: Two times a day (BID) | ORAL | Status: DC
  Administered 2016-10-31 – 2016-11-03 (×6): 80 mg via ORAL
  Filled 2016-10-31 (×6): qty 1

## 2016-10-31 MED ORDER — ROSUVASTATIN CALCIUM 20 MG PO TABS
20.0000 mg | ORAL_TABLET | Freq: Every day | ORAL | Status: DC
  Administered 2016-10-31 – 2016-11-05 (×6): 20 mg via ORAL
  Filled 2016-10-31 (×6): qty 1

## 2016-10-31 MED ORDER — SPIRONOLACTONE 25 MG PO TABS
25.0000 mg | ORAL_TABLET | Freq: Every day | ORAL | Status: DC
  Administered 2016-10-31 – 2016-11-05 (×6): 25 mg via ORAL
  Filled 2016-10-31 (×6): qty 1

## 2016-10-31 MED ORDER — POTASSIUM CHLORIDE CRYS ER 20 MEQ PO TBCR
20.0000 meq | EXTENDED_RELEASE_TABLET | Freq: Once | ORAL | Status: AC
  Administered 2016-10-31: 20 meq via ORAL
  Filled 2016-10-31: qty 1

## 2016-10-31 MED ORDER — FLUTICASONE PROPIONATE 50 MCG/ACT NA SUSP
2.0000 | Freq: Every day | NASAL | Status: DC | PRN
  Filled 2016-10-31: qty 16

## 2016-10-31 MED ORDER — RIVAROXABAN 20 MG PO TABS
20.0000 mg | ORAL_TABLET | Freq: Every day | ORAL | Status: DC
  Administered 2016-10-31 – 2016-11-04 (×5): 20 mg via ORAL
  Filled 2016-10-31 (×5): qty 1

## 2016-10-31 MED ORDER — MAGNESIUM SULFATE 2 GM/50ML IV SOLN
2.0000 g | Freq: Once | INTRAVENOUS | Status: AC
  Administered 2016-10-31: 2 g via INTRAVENOUS
  Filled 2016-10-31: qty 50

## 2016-10-31 MED ORDER — DIGOXIN 125 MCG PO TABS
0.1250 mg | ORAL_TABLET | Freq: Every day | ORAL | Status: DC
  Administered 2016-11-01 – 2016-11-05 (×5): 0.125 mg via ORAL
  Filled 2016-10-31 (×5): qty 1

## 2016-10-31 NOTE — Telephone Encounter (Signed)
Pt was admitted to [facility] yesterday, for acute respiratory failure.  Will route to Dr [name] and covering Nurse as an Juluis Rainier.
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
## 2016-10-31 NOTE — Progress Notes (Signed)
Clinical Social Worker met with patient at bedside and daughter [name] was present during assessment. Patient stated he was not sure if he wanted to do SNF. [name] tired convincing patient that SNF was more beneficial than home health but patient stated he wanted more time to think over it. Patient asked CSW if he could think about SNF and get back to her. CSW explained to patient that CSW will not be here over the weekend but to inform the weekend social worker. CSW remains available for support and discharge needs.  Rhea Pink, MSW,  [place]

## 2016-10-31 NOTE — Progress Notes (Signed)
OT Cancellation Note  Patient Details Name: Nicholas Caldwell MRN: [id number] DOB: 21-Jul-1946   Cancelled Treatment:    Reason Eval/Treat Not Completed: Patient at procedure or test/ unavailable - Pt with another provider.  [place], OTR/L [phone]   Lucille Passy M 10/31/2016, 3:18 PM

## 2016-10-31 NOTE — Progress Notes (Signed)
Dr. Elsworth Soho made aware of SBP in 70s with HR at times dropping into 40s after receiving Coreg this evening. Patient states that he feels dizzy with low blood pressure. No new orders at this time. Will continue to closely monitor, and wait for medication to clear system for now. Richarda Blade RN

## 2016-10-31 NOTE — Progress Notes (Signed)
Nicholas Caldwell is threatening to leave AMA this morning. He keeps stating that he is ready to leave and go home. I have educated him numerous times overnight about the importance of the Bipap machine, and why he is in the hospital. He states that he does not care, that he is going home. Educated on elevated CO2 level and high risk for respiratory failure and possible need for intubation if he refuses to wear Bipap, and/or leaves AMA. Will continue to provide for patient safety and reinforce importance of hospitalization at this time. Richarda Blade RN

## 2016-11-01 ENCOUNTER — Inpatient Hospital Stay (HOSPITAL_COMMUNITY): Payer: Medicare Other

## 2016-11-01 DIAGNOSIS — R9431 Abnormal electrocardiogram [ECG] [EKG]: Secondary | ICD-10-CM

## 2016-11-01 DIAGNOSIS — I480 Paroxysmal atrial fibrillation: Secondary | ICD-10-CM

## 2016-11-01 DIAGNOSIS — I272 Pulmonary hypertension, unspecified: Secondary | ICD-10-CM

## 2016-11-01 DIAGNOSIS — J9621 Acute and chronic respiratory failure with hypoxia: Secondary | ICD-10-CM

## 2016-11-01 DIAGNOSIS — E118 Type 2 diabetes mellitus with unspecified complications: Secondary | ICD-10-CM

## 2016-11-01 DIAGNOSIS — I509 Heart failure, unspecified: Secondary | ICD-10-CM

## 2016-11-01 DIAGNOSIS — G4733 Obstructive sleep apnea (adult) (pediatric): Secondary | ICD-10-CM

## 2016-11-01 DIAGNOSIS — J431 Panlobular emphysema: Secondary | ICD-10-CM

## 2016-11-01 DIAGNOSIS — E1165 Type 2 diabetes mellitus with hyperglycemia: Secondary | ICD-10-CM

## 2016-11-01 DIAGNOSIS — I428 Other cardiomyopathies: Secondary | ICD-10-CM

## 2016-11-01 DIAGNOSIS — R410 Disorientation, unspecified: Secondary | ICD-10-CM

## 2016-11-01 LAB — GLUCOSE, CAPILLARY
GLUCOSE-CAPILLARY: 143 mg/dL — AB (ref 65–99)
GLUCOSE-CAPILLARY: 166 mg/dL — AB (ref 65–99)
GLUCOSE-CAPILLARY: 230 mg/dL — AB (ref 65–99)
Glucose-Capillary: 132 mg/dL — ABNORMAL HIGH (ref 65–99)
Glucose-Capillary: 155 mg/dL — ABNORMAL HIGH (ref 65–99)

## 2016-11-01 LAB — LIPID PANEL
CHOLESTEROL: 124 mg/dL (ref 0–200)
HDL: 38 mg/dL — ABNORMAL LOW (ref >40)
LDL Cholesterol: 67 mg/dL (ref 0–99)
TRIGLYCERIDES: 95 mg/dL (ref <150)
Total CHOL/HDL Ratio: 3.3 RATIO
VLDL: 19 mg/dL (ref 0–40)

## 2016-11-01 LAB — ECHOCARDIOGRAM COMPLETE
Ao-asc: 34 cm
CHL CUP DOP CALC LVOT VTI: 21.3 cm
CHL CUP MV DEC (S): 204
CHL CUP RV SYS PRESS: 61 mmHg
CHL CUP TV REG PEAK VELOCITY: 339 cm/s
E/e' ratio: 15
EWDT: 204 ms
FS: 14 % — AB (ref 28–44)
HEIGHTINCHES: 72 in
IV/PV OW: 0.82
LA ID, A-P, ES: 44 mm
LA diam index: 1.78 cm/m2
LA vol A4C: 119 ml
LDCA: 3.46 cm2
LEFT ATRIUM END SYS DIAM: 44 mm
LV E/e' medial: 15
LV PW d: 13.7 mm — AB (ref 0.6–1.1)
LV TDI E'LATERAL: 7.4
LV TDI E'MEDIAL: 7.29
LV e' LATERAL: 7.4 cm/s
LVEEAVG: 15
LVOT SV: 74 mL
LVOT peak grad rest: 6 mmHg
LVOTD: 21 mm
LVOTPV: 118 cm/s
Lateral S' vel: 12.7 cm/s
MV Peak grad: 5 mmHg
MV pk A vel: 67.3 m/s
MV pk E vel: 111 m/s
TAPSE: 18.9 mm
TR max vel: 339 cm/s
Weight: 4544 oz

## 2016-11-01 LAB — BASIC METABOLIC PANEL
ANION GAP: 12 (ref 5–15)
BUN: 8 mg/dL (ref 6–20)
CALCIUM: 8.3 mg/dL — AB (ref 8.9–10.3)
CO2: 39 mmol/L — ABNORMAL HIGH (ref 22–32)
CREATININE: 0.92 mg/dL (ref 0.61–1.24)
Chloride: 87 mmol/L — ABNORMAL LOW (ref 101–111)
Glucose, Bld: 130 mg/dL — ABNORMAL HIGH (ref 65–99)
Potassium: 2.7 mmol/L — CL (ref 3.5–5.1)
SODIUM: 138 mmol/L (ref 135–145)

## 2016-11-01 LAB — MAGNESIUM: MAGNESIUM: 1.8 mg/dL (ref 1.7–2.4)

## 2016-11-01 MED ORDER — POTASSIUM CHLORIDE CRYS ER 20 MEQ PO TBCR
80.0000 meq | EXTENDED_RELEASE_TABLET | Freq: Once | ORAL | Status: AC
  Administered 2016-11-01: 80 meq via ORAL
  Filled 2016-11-01: qty 4

## 2016-11-01 MED ORDER — IPRATROPIUM-ALBUTEROL 0.5-2.5 (3) MG/3ML IN SOLN
3.0000 mL | Freq: Four times a day (QID) | RESPIRATORY_TRACT | Status: DC
  Administered 2016-11-01 – 2016-11-02 (×2): 3 mL via RESPIRATORY_TRACT
  Filled 2016-11-01 (×2): qty 3

## 2016-11-01 MED ORDER — CARVEDILOL 12.5 MG PO TABS
12.5000 mg | ORAL_TABLET | Freq: Two times a day (BID) | ORAL | Status: DC
  Administered 2016-11-02 – 2016-11-05 (×7): 12.5 mg via ORAL
  Filled 2016-11-01 (×7): qty 1

## 2016-11-01 MED ORDER — MAGNESIUM SULFATE 2 GM/50ML IV SOLN
2.0000 g | Freq: Once | INTRAVENOUS | Status: AC
  Administered 2016-11-01: 2 g via INTRAVENOUS
  Filled 2016-11-01: qty 50

## 2016-11-01 MED ORDER — INSULIN ASPART 100 UNIT/ML ~~LOC~~ SOLN
0.0000 [IU] | SUBCUTANEOUS | Status: DC
  Administered 2016-11-01: 4 [IU] via SUBCUTANEOUS

## 2016-11-01 NOTE — NC FL2 (Signed)
[place] LEVEL OF CARE SCREENING TOOL     IDENTIFICATION  Patient Name: Nicholas Caldwell Birthdate: 1945-11-24 Sex: male Admission Date (Current Location): 10/30/2016  [facility] and [place] Number:  Herbalist and Address:  [facility] [place]. [facility], [place] [address], [place], [place] [address]      Provider Number: [id number]  Attending Physician Name and Address:  Allie Bossier, MD  Relative Name and Phone Number:       Current Level of Care: Hospital Recommended Level of Care: [place] Prior Approval Number:    Date Approved/Denied: 11/01/16 PASRR Number: [id number]  Discharge Plan: SNF    Current Diagnoses: Patient Active Problem List   Diagnosis Date Noted  . Acute respiratory failure ([place]) 10/30/2016  . Pressure injury of skin 09/07/2016  . Acute on chronic respiratory failure with hypoxia ([place]) 09/05/2016  . Acute on chronic systolic heart failure ([place]) 09/05/2016  . CHF exacerbation ([place]) 09/05/2016  . Skin lesion-left heal 09/05/2016  . Acute exacerbation of CHF (congestive heart failure) ([place]) 07/28/2016  . Chronic systolic CHF (congestive heart failure) ([place]) 07/16/2016  . Paroxysmal atrial flutter ([place]) 07/16/2016  . COPD (chronic obstructive pulmonary disease) ([place]) 07/16/2016  . Uncontrolled type 2 diabetes mellitus with complication ([place])   . Diabetic polyneuropathy associated with diabetes mellitus due to underlying condition ([place])   . Normocytic anemia 06/29/2016  . CAD - Non-obstructive by LHC1/16 01/21/2016  . Prolonged QT interval 10/09/2015  . Nonischemic cardiomyopathy ([place]) 10/09/2015  . PAF (paroxysmal atrial fibrillation) ([place])   . Acute respiratory failure with hypoxia and hypercarbia ([place]) 02/19/2015  . Chronic pain 02/19/2015  . Morbid obesity ([place]) 02/13/2015  . Acute on chronic systolic CHF 92/42/6834  . Elevated troponin I level 11/01/2014  . COPD exacerbation ([place]) 11/01/2014  .  Essential hypertension 10/31/2014  . Type 2 diabetes mellitus with neuropathy 10/31/2014  . Hyperlipidemia  10/31/2014  . Smoker 10/31/2014    Orientation RESPIRATION BLADDER Height & Weight     Self, Time, Situation, Place  O2 (Nasal Cannula, 3L) Incontinent Weight:  (attempted bed wt- incorrect Refusing to get OOB at this time) Height:  6' (182.9 cm)  BEHAVIORAL SYMPTOMS/MOOD NEUROLOGICAL BOWEL NUTRITION STATUS      Continent  (Please see discharge summary)  AMBULATORY STATUS COMMUNICATION OF NEEDS Skin   Extensive Assist Verbally Normal                       Personal Care Assistance Level of Assistance  Bathing, Feeding, Dressing Bathing Assistance: Maximum assistance Feeding assistance: Limited assistance Dressing Assistance: Maximum assistance     Functional Limitations Info  Sight, Hearing, Speech Sight Info: Adequate Hearing Info: Impaired Speech Info: Adequate    SPECIAL CARE FACTORS FREQUENCY  OT (By licensed OT), Bowel and bladder program       OT Frequency: 3x week Bowel and Bladder Program Frequency: 3x week          Contractures Contractures Info: Not present    Additional Factors Info  Code Status, Allergies Code Status Info: Full Allergies Info: No known allergies           Current Medications (11/01/2016):  This is the current hospital active medication list Current Facility-Administered Medications  Medication Dose Route Frequency Provider Last Rate Last Dose  . 0.9 %  sodium chloride infusion  250 mL Intravenous PRN Praveen Mannam, MD      . carvedilol (COREG) tablet 25 mg  25 mg Oral BID WC Donita Brooks, NP   25 mg at 10/31/16 1842  . diazepam (VALIUM) tablet 2 mg  2 mg Oral Daily PRN Donita Brooks, NP      . digoxin (LANOXIN) tablet 0.125 mg  0.125 mg Oral Daily Donita Brooks, NP      . fluticasone (FLONASE) 50 MCG/ACT nasal spray 2 spray  2 spray Each Nare Daily PRN Donita Brooks, NP      . furosemide (LASIX) tablet 80 mg  80  mg Oral BID Donita Brooks, NP   80 mg at 10/31/16 1842  . insulin aspart (novoLOG) injection 0-15 Units  0-15 Units Subcutaneous Q4H Marshell Garfinkel, MD   2 Units at 11/01/16 0349  . ipratropium-albuterol (DUONEB) 0.5-2.5 (3) MG/3ML nebulizer solution 3 mL  3 mL Nebulization Q6H PRN Praveen Mannam, MD      . mometasone-formoterol (DULERA) 200-5 MCG/ACT inhaler 2 puff  2 puff Inhalation BID Donita Brooks, NP   2 puff at 10/31/16 2035  . oxyCODONE (Oxy IR/ROXICODONE) immediate release tablet 5 mg  5 mg Oral Q6H PRN Donita Brooks, NP   5 mg at 11/01/16 0604  . rivaroxaban (XARELTO) tablet 20 mg  20 mg Oral Q supper Donita Brooks, NP   20 mg at 10/31/16 1842  . rosuvastatin (CRESTOR) tablet 20 mg  20 mg Oral Daily Donita Brooks, NP   20 mg at 10/31/16 1702  . spironolactone (ALDACTONE) tablet 25 mg  25 mg Oral Daily Donita Brooks, NP   25 mg at 10/31/16 1702     Discharge Medications: Please see discharge summary for a list of discharge medications.  Relevant Imaging Results:  Relevant Lab Results:   Additional Information SSN: [id number]  Alla German, LCSW

## 2016-11-01 NOTE — Progress Notes (Signed)
CRITICAL VALUE ALERT  Critical value received:  POTASSIUM 2.7    Date of notification:  11/01/2016  Time of notification:  1025  Critical value read back:yes   Nurse who received alert:  Orlena Sheldon RN  MD notified (1st page):  Dia Crawford  Time of first page:  1028  MD notified (2nd page):  Dr. Lake Bells - who happened to be at bedside after Dr. Sherral Hammers paged.  Orders received.  Time of second page: 1029   Responding MD:  Lake Bells  Time MD responded:  1030

## 2016-11-01 NOTE — Progress Notes (Signed)
Patient is refusing the use of CPAP at this time. Patient states he does not wear one and does not want to wear one at this time. RT informed patient if he changes his mind have RN contact RT. RN aware.

## 2016-11-01 NOTE — Progress Notes (Signed)
  Echocardiogram 2D Echocardiogram has been performed.  Bobbye Charleston 11/01/2016, 12:11 PM

## 2016-11-01 NOTE — Progress Notes (Signed)
Tele sitter initiated as pt tries to get up on his own to [facility].  Report given to telesitter.  Will continue to monitor.

## 2016-11-01 NOTE — Progress Notes (Signed)
Pt transferred to 3E30 with SCDs and all personal belongings. Pt remained stable throughout transfer. 3E staff to assume care of pt.  Sherlie Ban, RN

## 2016-11-01 NOTE — Progress Notes (Signed)
Dr Sherral Hammers updated about heart rhythm and bp changes last night after taking carvedilol.  Order received to hold carvedolol for now.
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
## 2016-11-02 ENCOUNTER — Inpatient Hospital Stay (HOSPITAL_COMMUNITY): Payer: Medicare Other

## 2016-11-02 DIAGNOSIS — J9602 Acute respiratory failure with hypercapnia: Secondary | ICD-10-CM

## 2016-11-02 DIAGNOSIS — Z91199 Patient's noncompliance with other medical treatment and regimen due to unspecified reason: Secondary | ICD-10-CM

## 2016-11-02 DIAGNOSIS — Z9119 Patient's noncompliance with other medical treatment and regimen: Secondary | ICD-10-CM

## 2016-11-02 DIAGNOSIS — J9620 Acute and chronic respiratory failure, unspecified whether with hypoxia or hypercapnia: Secondary | ICD-10-CM

## 2016-11-02 DIAGNOSIS — J449 Chronic obstructive pulmonary disease, unspecified: Secondary | ICD-10-CM

## 2016-11-02 DIAGNOSIS — I509 Heart failure, unspecified: Secondary | ICD-10-CM

## 2016-11-02 LAB — ECHOCARDIOGRAM LIMITED
FS: 12 % — AB (ref 28–44)
HEIGHTINCHES: 72 in
IVS/LV PW RATIO, ED: 0.94
LA ID, A-P, ES: 49 mm
LA diam index: 2.05 cm/m2
LEFT ATRIUM END SYS DIAM: 49 mm
LV PW d: 12.1 mm — AB (ref 0.6–1.1)
LV dias vol: 274 mL — AB (ref 62–150)
LV sys vol index: 74 mL/m2
LV sys vol: 178 mL — AB (ref 21–61)
LVDIAVOLIN: 115 mL/m2
Reg peak vel: 324 cm/s
Simpson's disk: 35
Stroke v: 96 ml
TRMAXVEL: 324 cm/s
WEIGHTICAEL: 4209.6 [oz_av]

## 2016-11-02 LAB — GLUCOSE, CAPILLARY
GLUCOSE-CAPILLARY: 190 mg/dL — AB (ref 65–99)
GLUCOSE-CAPILLARY: 159 mg/dL — AB (ref 65–99)
Glucose-Capillary: 136 mg/dL — ABNORMAL HIGH (ref 65–99)
Glucose-Capillary: 173 mg/dL — ABNORMAL HIGH (ref 65–99)

## 2016-11-02 LAB — BASIC METABOLIC PANEL
ANION GAP: 12 (ref 5–15)
BUN: 8 mg/dL (ref 6–20)
CALCIUM: 8.5 mg/dL — AB (ref 8.9–10.3)
CO2: 39 mmol/L — AB (ref 22–32)
Chloride: 91 mmol/L — ABNORMAL LOW (ref 101–111)
Creatinine, Ser: 0.82 mg/dL (ref 0.61–1.24)
GFR calc non Af Amer: >60 mL/min (ref >60)
GLUCOSE: 128 mg/dL — AB (ref 65–99)
POTASSIUM: 3.1 mmol/L — AB (ref 3.5–5.1)
Sodium: 142 mmol/L (ref 135–145)

## 2016-11-02 LAB — DIGOXIN LEVEL: Digoxin Level: <0.2 ng/mL — ABNORMAL LOW (ref 0.8–2.0)

## 2016-11-02 LAB — MAGNESIUM: Magnesium: 1.9 mg/dL (ref 1.7–2.4)

## 2016-11-02 LAB — HEMOGLOBIN A1C
Hgb A1c MFr Bld: 8.3 % — ABNORMAL HIGH (ref 4.8–5.6)
MEAN PLASMA GLUCOSE: 192 mg/dL

## 2016-11-02 MED ORDER — DIAZEPAM 2 MG PO TABS
1.0000 mg | ORAL_TABLET | Freq: Every day | ORAL | Status: DC | PRN
  Administered 2016-11-03 – 2016-11-05 (×3): 1 mg via ORAL
  Filled 2016-11-02 (×3): qty 1

## 2016-11-02 MED ORDER — IPRATROPIUM-ALBUTEROL 0.5-2.5 (3) MG/3ML IN SOLN
3.0000 mL | Freq: Four times a day (QID) | RESPIRATORY_TRACT | Status: DC
  Administered 2016-11-03 – 2016-11-04 (×5): 3 mL via RESPIRATORY_TRACT
  Filled 2016-11-02 (×5): qty 3

## 2016-11-02 MED ORDER — INSULIN ASPART 100 UNIT/ML ~~LOC~~ SOLN
0.0000 [IU] | Freq: Three times a day (TID) | SUBCUTANEOUS | Status: DC
  Administered 2016-11-02: 4 [IU] via SUBCUTANEOUS
  Administered 2016-11-02: 3 [IU] via SUBCUTANEOUS
  Administered 2016-11-02: 4 [IU] via SUBCUTANEOUS
  Administered 2016-11-03: 7 [IU] via SUBCUTANEOUS
  Administered 2016-11-03: 3 [IU] via SUBCUTANEOUS
  Administered 2016-11-03: 7 [IU] via SUBCUTANEOUS
  Administered 2016-11-03: 4 [IU] via SUBCUTANEOUS
  Administered 2016-11-04: 3 [IU] via SUBCUTANEOUS
  Administered 2016-11-04: 4 [IU] via SUBCUTANEOUS
  Administered 2016-11-04: 7 [IU] via SUBCUTANEOUS
  Administered 2016-11-04 – 2016-11-05 (×2): 4 [IU] via SUBCUTANEOUS

## 2016-11-02 MED ORDER — POTASSIUM CHLORIDE CRYS ER 20 MEQ PO TBCR
80.0000 meq | EXTENDED_RELEASE_TABLET | Freq: Once | ORAL | Status: AC
  Administered 2016-11-02: 80 meq via ORAL
  Filled 2016-11-02: qty 4

## 2016-11-02 MED ORDER — PERFLUTREN LIPID MICROSPHERE
INTRAVENOUS | Status: AC
  Administered 2016-11-02: 2 mL
  Filled 2016-11-02: qty 10

## 2016-11-02 MED ORDER — MAGNESIUM SULFATE 2 GM/50ML IV SOLN
2.0000 g | Freq: Once | INTRAVENOUS | Status: AC
  Administered 2016-11-02: 2 g via INTRAVENOUS
  Filled 2016-11-02: qty 50

## 2016-11-02 MED ORDER — DIGOXIN 125 MCG PO TABS
0.2500 mg | ORAL_TABLET | Freq: Once | ORAL | Status: AC
  Administered 2016-11-02: 0.25 mg via ORAL
  Filled 2016-11-02: qty 2

## 2016-11-02 NOTE — Discharge Instructions (Signed)
Information on my medicine - XARELTO® (Rivaroxaban) ° °This medication education was reviewed with me or my healthcare representative as part of my discharge preparation.   ° °Why was Xarelto® prescribed for you? °Xarelto® was prescribed for you to reduce the risk of a blood clot forming that can cause a stroke if you have a medical condition called atrial fibrillation (a type of irregular heartbeat). ° °What do you need to know about xarelto® ? °Take your Xarelto® ONCE DAILY at the same time every day with your evening meal. °If you have difficulty swallowing the tablet whole, you may crush it and mix in applesauce just prior to taking your dose. ° °Take Xarelto® exactly as prescribed by your doctor and DO NOT stop taking Xarelto® without talking to the doctor who prescribed the medication.  Stopping without other stroke prevention medication to take the place of Xarelto® may increase your risk of developing a clot that causes a stroke.  Refill your prescription before you run out. ° °After discharge, you should have regular check-up appointments with your healthcare provider that is prescribing your Xarelto®.  In the future your dose may need to be changed if your kidney function or weight changes by a significant amount. ° °What do you do if you miss a dose? °If you are taking Xarelto® ONCE DAILY and you miss a dose, take it as soon as you remember on the same day then continue your regularly scheduled once daily regimen the next day. Do not take two doses of Xarelto® at the same time or on the same day.  ° °Important Safety Information °A possible side effect of Xarelto® is bleeding. You should call your healthcare provider right away if you experience any of the following: °? Bleeding from an injury or your nose that does not stop. °? Unusual colored urine (red or dark brown) or unusual colored stools (red or black). °? Unusual bruising for unknown reasons. °? A serious fall or if you hit your head (even if  there is no bleeding). ° °Some medicines may interact with Xarelto® and might increase your risk of bleeding while on Xarelto®. To help avoid this, consult your healthcare provider or pharmacist prior to using any new prescription or non-prescription medications, including herbals, vitamins, non-steroidal anti-inflammatory drugs (NSAIDs) and supplements. ° °This website has more information on Xarelto®: www.xarelto.com. ° ° °
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
## 2016-11-02 NOTE — Progress Notes (Signed)
Patient placed on CPAP for HS using FFM.  Patient states he wears FFM at home, but family says he does not wear.  Auto BiPap mode used, minimum pressure 4, maximum 25.  3L 02 bled in.  Patient tolerated well.

## 2016-11-02 NOTE — Progress Notes (Signed)
  Echocardiogram 2D Echocardiogram Limited with Definity has been performed.  Bobbye Charleston 11/02/2016, 10:53 AM

## 2016-11-03 DIAGNOSIS — I480 Paroxysmal atrial fibrillation: Secondary | ICD-10-CM

## 2016-11-03 DIAGNOSIS — I5023 Acute on chronic systolic (congestive) heart failure: Secondary | ICD-10-CM

## 2016-11-03 LAB — MAGNESIUM: Magnesium: 1.9 mg/dL (ref 1.7–2.4)

## 2016-11-03 LAB — BASIC METABOLIC PANEL
Anion gap: 12 (ref 5–15)
BUN: 5 mg/dL — AB (ref 6–20)
CHLORIDE: 94 mmol/L — AB (ref 101–111)
CO2: 34 mmol/L — AB (ref 22–32)
CREATININE: 0.72 mg/dL (ref 0.61–1.24)
Calcium: 8.6 mg/dL — ABNORMAL LOW (ref 8.9–10.3)
GFR calc non Af Amer: >60 mL/min (ref >60)
Glucose, Bld: 136 mg/dL — ABNORMAL HIGH (ref 65–99)
Potassium: 3.5 mmol/L (ref 3.5–5.1)
Sodium: 140 mmol/L (ref 135–145)

## 2016-11-03 LAB — GLUCOSE, CAPILLARY
GLUCOSE-CAPILLARY: 158 mg/dL — AB (ref 65–99)
GLUCOSE-CAPILLARY: 195 mg/dL — AB (ref 65–99)
GLUCOSE-CAPILLARY: 202 mg/dL — AB (ref 65–99)
Glucose-Capillary: 210 mg/dL — ABNORMAL HIGH (ref 65–99)

## 2016-11-03 MED ORDER — MAGNESIUM SULFATE 2 GM/50ML IV SOLN
2.0000 g | Freq: Once | INTRAVENOUS | Status: AC
  Administered 2016-11-03: 2 g via INTRAVENOUS
  Filled 2016-11-03: qty 50

## 2016-11-03 MED ORDER — POTASSIUM CHLORIDE CRYS ER 20 MEQ PO TBCR
40.0000 meq | EXTENDED_RELEASE_TABLET | Freq: Two times a day (BID) | ORAL | Status: DC
  Administered 2016-11-03 – 2016-11-05 (×5): 40 meq via ORAL
  Filled 2016-11-03 (×5): qty 2

## 2016-11-03 MED ORDER — AMIODARONE HCL 200 MG PO TABS
400.0000 mg | ORAL_TABLET | Freq: Two times a day (BID) | ORAL | Status: DC
  Administered 2016-11-03 – 2016-11-04 (×4): 400 mg via ORAL
  Filled 2016-11-03 (×4): qty 2

## 2016-11-03 MED ORDER — FUROSEMIDE 10 MG/ML IJ SOLN
80.0000 mg | Freq: Two times a day (BID) | INTRAMUSCULAR | Status: DC
  Administered 2016-11-03 – 2016-11-04 (×3): 80 mg via INTRAVENOUS
  Filled 2016-11-03 (×3): qty 8

## 2016-11-03 MED ORDER — SACUBITRIL-VALSARTAN 24-26 MG PO TABS
1.0000 | ORAL_TABLET | Freq: Two times a day (BID) | ORAL | Status: DC
  Administered 2016-11-03 (×2): 1 via ORAL
  Filled 2016-11-03 (×3): qty 1

## 2016-11-03 NOTE — Progress Notes (Signed)
   11/03/16 2143  BiPAP/CPAP/SIPAP  BiPAP/CPAP/SIPAP Pt Type Adult  Mask Type Full face mask  Mask Size Large  Set Rate 0 breaths/min  Respiratory Rate 16 breaths/min  IPAP (25)  EPAP (4)  Flow Rate 3 lpm  BiPAP/CPAP/SIPAP CPAP  Patient Home Equipment No  Auto Titrate Yes  Patient placed on CPAP Auto mode with 3L bleed in. He tolerates it very well at this time.

## 2016-11-03 NOTE — Clinical Social Work Note (Signed)
CSW met with patient. No supports at bedside. Patient is still undecided on whether or not he wants to go to SNF but gave CSW permission to fax out referral so we can at least know what our options are. CSW addressed all of the patient's questions regarding SNF.  Nicholas Caldwell, [place]
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
## 2016-11-04 ENCOUNTER — Ambulatory Visit: Payer: Medicare Other | Admitting: Cardiology

## 2016-11-04 LAB — CULTURE, BLOOD (ROUTINE X 2)
CULTURE: NO GROWTH
Culture: NO GROWTH

## 2016-11-04 LAB — CBC
HCT: 36 % — ABNORMAL LOW (ref 39.0–52.0)
Hemoglobin: 11 g/dL — ABNORMAL LOW (ref 13.0–17.0)
MCH: 25.9 pg — ABNORMAL LOW (ref 26.0–34.0)
MCHC: 30.6 g/dL (ref 30.0–36.0)
MCV: 84.9 fL (ref 78.0–100.0)
PLATELETS: 385 10*3/uL (ref 150–400)
RBC: 4.24 MIL/uL (ref 4.22–5.81)
RDW: 16 % — AB (ref 11.5–15.5)
WBC: 8.5 10*3/uL (ref 4.0–10.5)

## 2016-11-04 LAB — GLUCOSE, CAPILLARY
GLUCOSE-CAPILLARY: 150 mg/dL — AB (ref 65–99)
GLUCOSE-CAPILLARY: 160 mg/dL — AB (ref 65–99)
Glucose-Capillary: 217 mg/dL — ABNORMAL HIGH (ref 65–99)
Glucose-Capillary: 157 mg/dL — ABNORMAL HIGH (ref 65–99)

## 2016-11-04 LAB — BASIC METABOLIC PANEL
Anion gap: 8 (ref 5–15)
BUN: 7 mg/dL (ref 6–20)
CALCIUM: 8.3 mg/dL — AB (ref 8.9–10.3)
CO2: 35 mmol/L — ABNORMAL HIGH (ref 22–32)
Chloride: 93 mmol/L — ABNORMAL LOW (ref 101–111)
Creatinine, Ser: 0.83 mg/dL (ref 0.61–1.24)
GFR calc Af Amer: >60 mL/min (ref >60)
GLUCOSE: 147 mg/dL — AB (ref 65–99)
Potassium: 3.7 mmol/L (ref 3.5–5.1)
SODIUM: 136 mmol/L (ref 135–145)

## 2016-11-04 LAB — DIGOXIN LEVEL: Digoxin Level: 0.2 ng/mL — ABNORMAL LOW (ref 0.8–2.0)

## 2016-11-04 LAB — MAGNESIUM: MAGNESIUM: 1.8 mg/dL (ref 1.7–2.4)

## 2016-11-04 MED ORDER — MAGNESIUM OXIDE 400 (241.3 MG) MG PO TABS
400.0000 mg | ORAL_TABLET | Freq: Every day | ORAL | Status: DC
  Administered 2016-11-04: 400 mg via ORAL
  Filled 2016-11-04: qty 1

## 2016-11-04 MED ORDER — SACUBITRIL-VALSARTAN 49-51 MG PO TABS
1.0000 | ORAL_TABLET | Freq: Two times a day (BID) | ORAL | Status: DC
  Administered 2016-11-04 – 2016-11-05 (×3): 1 via ORAL
  Filled 2016-11-04 (×3): qty 1

## 2016-11-04 MED ORDER — IPRATROPIUM-ALBUTEROL 0.5-2.5 (3) MG/3ML IN SOLN: 3.0000 mL | RESPIRATORY_TRACT | Status: DC | PRN

## 2016-11-04 MED ORDER — IPRATROPIUM-ALBUTEROL 0.5-2.5 (3) MG/3ML IN SOLN
3.0000 mL | Freq: Three times a day (TID) | RESPIRATORY_TRACT | Status: DC
  Administered 2016-11-04 – 2016-11-05 (×2): 3 mL via RESPIRATORY_TRACT
  Filled 2016-11-04 (×3): qty 3
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
## 2016-11-04 NOTE — Progress Notes (Addendum)
Inpatient Diabetes Program Recommendations  AACE/ADA: New Consensus Statement on Inpatient Glycemic Control (2015)  Target Ranges:  Prepandial:   less than 140 mg/dL      Peak postprandial:   less than 180 mg/dL (1-2 hours)      Critically ill patients:  140 - 180 mg/dL   Results for MELTON, WALLS (MRN [id number]) as of 11/04/2016 11:24  Ref. Range 11/03/2016 07:06 11/03/2016 12:37 11/03/2016 16:26 11/03/2016 20:53 11/04/2016 05:45  Glucose-Capillary Latest Ref Range: 65 - 99 mg/dL 158 (H) 195 (H) 202 (H) 210 (H) 150 (H)   Results for CABELL, LAZENBY (MRN [id number]) as of 11/04/2016 11:24  Ref. Range 07/28/2016 03:19 11/01/2016 08:51  Hemoglobin A1C Latest Ref Range: 4.8 - 5.6 % 10.0 (H) 8.3 (H) (improved, but >7.0)   Review of Glycemic Control  Diabetes history:     DM2, Obesity, BUN and Creatinine WNL Outpatient Diabetes medications:     Glipizide 10 mg BID,     Novolog - not taking,     Levemir 44 units QHS Current orders for Inpatient glycemic control:     Novolog 0-20 units [facility]  Inpatient Diabetes Program Recommendations:    Noted patient received 21 units of Novolog correction over past 24 hours.    Please consider Levemir 10 units daily.      If start Levemir, please consider decreasing correction scale to sensitive (Novolog 0-9 units TIDAC and 0-5 units QHS).  Note: Spoke with patient and wife at bedside.  Patient states he is taking Glipizide and Levemir.  Patient states he is able to get medications.  Patient states that he monitors CBG's daily and understands the why keeping CBG's WNL is important long-term to limit/prevent long-term complications associated with DM.  Thank you,  Windy Carina, RN, MSN Diabetes Coordinator Inpatient Diabetes Program [phone] (Team Pager)
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
## 2016-11-05 ENCOUNTER — Telehealth (HOSPITAL_COMMUNITY): Payer: Self-pay | Admitting: Internal Medicine

## 2016-11-05 DIAGNOSIS — I5043 Acute on chronic combined systolic (congestive) and diastolic (congestive) heart failure: Secondary | ICD-10-CM

## 2016-11-05 LAB — BASIC METABOLIC PANEL
Anion gap: 9 (ref 5–15)
BUN: 10 mg/dL (ref 6–20)
CO2: 34 mmol/L — ABNORMAL HIGH (ref 22–32)
Calcium: 8.4 mg/dL — ABNORMAL LOW (ref 8.9–10.3)
Chloride: 95 mmol/L — ABNORMAL LOW (ref 101–111)
Creatinine, Ser: 0.9 mg/dL (ref 0.61–1.24)
GFR calc Af Amer: >60 mL/min (ref >60)
Glucose, Bld: 197 mg/dL — ABNORMAL HIGH (ref 65–99)
POTASSIUM: 3.9 mmol/L (ref 3.5–5.1)
SODIUM: 138 mmol/L (ref 135–145)

## 2016-11-05 LAB — CBC
HEMATOCRIT: 34.8 % — AB (ref 39.0–52.0)
Hemoglobin: 10.8 g/dL — ABNORMAL LOW (ref 13.0–17.0)
MCH: 26.4 pg (ref 26.0–34.0)
MCHC: 31 g/dL (ref 30.0–36.0)
MCV: 85.1 fL (ref 78.0–100.0)
PLATELETS: 404 10*3/uL — AB (ref 150–400)
RBC: 4.09 MIL/uL — ABNORMAL LOW (ref 4.22–5.81)
RDW: 16 % — AB (ref 11.5–15.5)
WBC: 8.9 10*3/uL (ref 4.0–10.5)

## 2016-11-05 LAB — MAGNESIUM: MAGNESIUM: 1.7 mg/dL (ref 1.7–2.4)

## 2016-11-05 LAB — GLUCOSE, CAPILLARY: GLUCOSE-CAPILLARY: 172 mg/dL — AB (ref 65–99)

## 2016-11-05 MED ORDER — AMIODARONE HCL 400 MG PO TABS: ORAL_TABLET | ORAL | 0 refills | Status: DC

## 2016-11-05 MED ORDER — INSULIN DETEMIR 100 UNIT/ML ~~LOC~~ SOLN: 20.0000 [IU] | Freq: Every evening | SUBCUTANEOUS | 11 refills | Status: DC

## 2016-11-05 MED ORDER — TORSEMIDE 20 MG PO TABS: 80.0000 mg | ORAL_TABLET | Freq: Every day | ORAL | 0 refills | Status: DC

## 2016-11-05 MED ORDER — IPRATROPIUM-ALBUTEROL 0.5-2.5 (3) MG/3ML IN SOLN: 3.0000 mL | Freq: Two times a day (BID) | RESPIRATORY_TRACT | Status: DC

## 2016-11-05 MED ORDER — MAGNESIUM OXIDE 400 (241.3 MG) MG PO TABS: 400.0000 mg | ORAL_TABLET | Freq: Two times a day (BID) | ORAL | 0 refills | Status: DC

## 2016-11-05 MED ORDER — MAGNESIUM SULFATE 2 GM/50ML IV SOLN
2.0000 g | Freq: Once | INTRAVENOUS | Status: AC
  Administered 2016-11-05: 2 g via INTRAVENOUS
  Filled 2016-11-05: qty 50

## 2016-11-05 MED ORDER — CARVEDILOL 12.5 MG PO TABS: 12.5000 mg | ORAL_TABLET | Freq: Two times a day (BID) | ORAL | 0 refills | Status: DC

## 2016-11-05 MED ORDER — ALBUTEROL SULFATE (2.5 MG/3ML) 0.083% IN NEBU: 2.5000 mg | INHALATION_SOLUTION | Freq: Four times a day (QID) | RESPIRATORY_TRACT | Status: DC | PRN

## 2016-11-05 MED ORDER — MAGNESIUM OXIDE 400 (241.3 MG) MG PO TABS
400.0000 mg | ORAL_TABLET | Freq: Two times a day (BID) | ORAL | Status: DC
  Administered 2016-11-05: 400 mg via ORAL
  Filled 2016-11-05: qty 1

## 2016-11-05 MED ORDER — AMIODARONE HCL 200 MG PO TABS
400.0000 mg | ORAL_TABLET | Freq: Every day | ORAL | Status: DC
  Administered 2016-11-05: 400 mg via ORAL
  Filled 2016-11-05: qty 2

## 2016-11-05 MED ORDER — TORSEMIDE 20 MG PO TABS
80.0000 mg | ORAL_TABLET | Freq: Every day | ORAL | Status: DC
  Administered 2016-11-05: 80 mg via ORAL
  Filled 2016-11-05: qty 4

## 2016-11-05 NOTE — Progress Notes (Signed)
OT Cancellation Note  Patient Details Name: Nicholas Caldwell MRN: [id number] DOB: 01-25-1946   Cancelled Treatment:    Reason Eval/Treat Not Completed: Other (comment). Pt on BSC with RN and preparing to receive meds. Per RN pt to d/c today and states that he is not interested in therapy today  Britt Bottom 11/05/2016, 9:47 AM

## 2016-11-05 NOTE — Care Management Important Message (Signed)
Important Message  Patient Details  Name: Nicholas Caldwell MRN: [id number] Date of Birth: 03-05-1946   Medicare Important Message Given:  Yes    Zahriyah Joo [address] 11/05/2016, 8:31 AM
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
## 2016-11-05 NOTE — Telephone Encounter (Signed)
LMTCB on pt home phone.  Per Oda Kilts, PA-C pt has appt scheduled for [facility] but needs f/u with AHF clinic. Jonni Sanger would like to see if pt can come 11/14/16 _0 :30 am
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
## 2016-11-05 NOTE — Progress Notes (Signed)
Pt has orders to be discharged. Discharge instructions given and pt has no additional questions at this time. Medication regimen reviewed and pt educated. Pt verbalized understanding and has no additional questions. Telemetry box removed. IV removed and site in good condition. Pt stable and waiting for transportation.   Maurene Capes RN
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
## 2016-11-05 NOTE — Clinical Social Work Note (Signed)
PT now recommending HHPT and patient is agreeable.   CSW signing off. Consult again if any social work needs arise.  Dayton Scrape, [place]

## 2016-11-06 DIAGNOSIS — E1165 Type 2 diabetes mellitus with hyperglycemia: Secondary | ICD-10-CM | POA: Diagnosis not present

## 2016-11-06 DIAGNOSIS — I5023 Acute on chronic systolic (congestive) heart failure: Secondary | ICD-10-CM | POA: Diagnosis not present

## 2016-11-06 DIAGNOSIS — I110 Hypertensive heart disease with heart failure: Secondary | ICD-10-CM | POA: Diagnosis not present

## 2016-11-06 DIAGNOSIS — L89622 Pressure ulcer of left heel, stage 2: Secondary | ICD-10-CM | POA: Diagnosis not present

## 2016-11-06 DIAGNOSIS — J441 Chronic obstructive pulmonary disease with (acute) exacerbation: Secondary | ICD-10-CM | POA: Diagnosis not present

## 2016-11-06 DIAGNOSIS — J9621 Acute and chronic respiratory failure with hypoxia: Secondary | ICD-10-CM | POA: Diagnosis not present

## 2016-11-07 ENCOUNTER — Telehealth: Payer: Self-pay | Admitting: Interventional Cardiology

## 2016-11-07 DIAGNOSIS — I110 Hypertensive heart disease with heart failure: Secondary | ICD-10-CM | POA: Diagnosis not present

## 2016-11-07 DIAGNOSIS — I5023 Acute on chronic systolic (congestive) heart failure: Secondary | ICD-10-CM | POA: Diagnosis not present

## 2016-11-07 DIAGNOSIS — E1165 Type 2 diabetes mellitus with hyperglycemia: Secondary | ICD-10-CM | POA: Diagnosis not present

## 2016-11-07 DIAGNOSIS — L89622 Pressure ulcer of left heel, stage 2: Secondary | ICD-10-CM | POA: Diagnosis not present

## 2016-11-07 DIAGNOSIS — J441 Chronic obstructive pulmonary disease with (acute) exacerbation: Secondary | ICD-10-CM | POA: Diagnosis not present

## 2016-11-07 DIAGNOSIS — J9621 Acute and chronic respiratory failure with hypoxia: Secondary | ICD-10-CM | POA: Diagnosis not present

## 2016-11-07 NOTE — Telephone Encounter (Signed)
New Message   Physical therapist requesting orders for two times a week for four weeks.

## 2016-11-07 NOTE — Telephone Encounter (Signed)
**Note De-Identified  Obfuscation** I left a detailed message on [name]'s VM asking her to refer to the pts PCP concerning the pts PT.

## 2016-11-10 ENCOUNTER — Telehealth: Payer: Self-pay | Admitting: Interventional Cardiology

## 2016-11-10 DIAGNOSIS — M545 Low back pain: Secondary | ICD-10-CM | POA: Diagnosis not present

## 2016-11-10 DIAGNOSIS — M792 Neuralgia and neuritis, unspecified: Secondary | ICD-10-CM | POA: Diagnosis not present

## 2016-11-10 DIAGNOSIS — Z79899 Other long term (current) drug therapy: Secondary | ICD-10-CM | POA: Diagnosis not present

## 2016-11-10 DIAGNOSIS — G894 Chronic pain syndrome: Secondary | ICD-10-CM | POA: Diagnosis not present

## 2016-11-10 DIAGNOSIS — Z79891 Long term (current) use of opiate analgesic: Secondary | ICD-10-CM | POA: Diagnosis not present

## 2016-11-10 DIAGNOSIS — M25559 Pain in unspecified hip: Secondary | ICD-10-CM | POA: Diagnosis not present

## 2016-11-10 NOTE — Telephone Encounter (Signed)
New Message:     Needs a verbal order for pt to get skilled nursing please.

## 2016-11-10 NOTE — Telephone Encounter (Signed)
**Note De-Identified  Obfuscation** I left a detailed message on the VM stating that they should refer to the pts PCP for PT orders as Dr [name] sees the pt for CHF. The pt was hospitalized for Acute Resp failure and PT was ordered for that.

## 2016-11-11 DIAGNOSIS — E1165 Type 2 diabetes mellitus with hyperglycemia: Secondary | ICD-10-CM | POA: Diagnosis not present

## 2016-11-11 DIAGNOSIS — L89622 Pressure ulcer of left heel, stage 2: Secondary | ICD-10-CM | POA: Diagnosis not present

## 2016-11-11 DIAGNOSIS — I5023 Acute on chronic systolic (congestive) heart failure: Secondary | ICD-10-CM | POA: Diagnosis not present

## 2016-11-11 DIAGNOSIS — I110 Hypertensive heart disease with heart failure: Secondary | ICD-10-CM | POA: Diagnosis not present

## 2016-11-11 DIAGNOSIS — J441 Chronic obstructive pulmonary disease with (acute) exacerbation: Secondary | ICD-10-CM | POA: Diagnosis not present

## 2016-11-11 DIAGNOSIS — J9621 Acute and chronic respiratory failure with hypoxia: Secondary | ICD-10-CM | POA: Diagnosis not present
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
## 2016-11-11 NOTE — Telephone Encounter (Signed)
Follow up      Calling to get name of patients current medications.  There is a question about his torsemide.

## 2016-11-14 ENCOUNTER — Inpatient Hospital Stay (HOSPITAL_COMMUNITY): Admission: RE | Admit: 2016-11-14 | Payer: Medicare Other | Source: Ambulatory Visit

## 2016-11-14 DIAGNOSIS — J441 Chronic obstructive pulmonary disease with (acute) exacerbation: Secondary | ICD-10-CM | POA: Diagnosis not present

## 2016-11-14 DIAGNOSIS — I5023 Acute on chronic systolic (congestive) heart failure: Secondary | ICD-10-CM | POA: Diagnosis not present

## 2016-11-14 DIAGNOSIS — I110 Hypertensive heart disease with heart failure: Secondary | ICD-10-CM | POA: Diagnosis not present

## 2016-11-14 DIAGNOSIS — E1165 Type 2 diabetes mellitus with hyperglycemia: Secondary | ICD-10-CM | POA: Diagnosis not present

## 2016-11-14 DIAGNOSIS — J9621 Acute and chronic respiratory failure with hypoxia: Secondary | ICD-10-CM | POA: Diagnosis not present

## 2016-11-14 DIAGNOSIS — L89622 Pressure ulcer of left heel, stage 2: Secondary | ICD-10-CM | POA: Diagnosis not present

## 2016-11-16 DIAGNOSIS — I5023 Acute on chronic systolic (congestive) heart failure: Secondary | ICD-10-CM | POA: Diagnosis not present

## 2016-11-16 DIAGNOSIS — Z9981 Dependence on supplemental oxygen: Secondary | ICD-10-CM | POA: Diagnosis not present

## 2016-11-16 DIAGNOSIS — I2510 Atherosclerotic heart disease of native coronary artery without angina pectoris: Secondary | ICD-10-CM | POA: Diagnosis not present

## 2016-11-16 DIAGNOSIS — I480 Paroxysmal atrial fibrillation: Secondary | ICD-10-CM | POA: Diagnosis not present

## 2016-11-16 DIAGNOSIS — Z7901 Long term (current) use of anticoagulants: Secondary | ICD-10-CM | POA: Diagnosis not present

## 2016-11-16 DIAGNOSIS — I272 Pulmonary hypertension, unspecified: Secondary | ICD-10-CM | POA: Diagnosis not present

## 2016-11-16 DIAGNOSIS — J449 Chronic obstructive pulmonary disease, unspecified: Secondary | ICD-10-CM | POA: Diagnosis not present

## 2016-11-16 DIAGNOSIS — E1165 Type 2 diabetes mellitus with hyperglycemia: Secondary | ICD-10-CM | POA: Diagnosis not present

## 2016-11-16 DIAGNOSIS — Z794 Long term (current) use of insulin: Secondary | ICD-10-CM | POA: Diagnosis not present

## 2016-11-16 DIAGNOSIS — Z7951 Long term (current) use of inhaled steroids: Secondary | ICD-10-CM | POA: Diagnosis not present

## 2016-11-16 DIAGNOSIS — J9621 Acute and chronic respiratory failure with hypoxia: Secondary | ICD-10-CM | POA: Diagnosis not present

## 2016-11-16 DIAGNOSIS — Z720 Tobacco use: Secondary | ICD-10-CM | POA: Diagnosis not present

## 2016-11-16 DIAGNOSIS — Z79891 Long term (current) use of opiate analgesic: Secondary | ICD-10-CM | POA: Diagnosis not present

## 2016-11-16 DIAGNOSIS — I110 Hypertensive heart disease with heart failure: Secondary | ICD-10-CM | POA: Diagnosis not present

## 2016-11-18 ENCOUNTER — Telehealth: Payer: Self-pay | Admitting: Interventional Cardiology

## 2016-11-18 NOTE — Telephone Encounter (Signed)
[facility] called for pt's re-certification of care. She wanted to know if [facility] can continue care for additional four more weeks. Pamala Hurry states that pt is not compliant with medications. Pamala Hurry states that the PCP did order the care on D/C from the hospital.  Pamala Hurry was made aware that she needs to call pt's PCP for recommendations.

## 2016-11-18 NOTE — Telephone Encounter (Signed)
New Message  Marland Mcalpine voiced pt due for re-certification of care last fri and wanting to know if they can continue care for an additional four more weeks.  Please f/u if needed

## 2016-11-20 ENCOUNTER — Encounter (HOSPITAL_COMMUNITY): Payer: Medicare Other

## 2016-11-23 NOTE — Progress Notes (Signed)
Cardiology Office Note   Date:  11/24/2016   ID:  Nicholas Caldwell, DOB 17-Oct-1946, MRN [id number]  PCP:  [facility]    Chief Complaint  Patient presents with  . Congestive Heart Failure     Wt Readings from Last 3 Encounters:  11/24/16 284 lb 6.4 oz (129 kg)  11/05/16 259 lb 1.6 oz (117.5 kg)  09/16/16 265 lb (120.2 kg)       History of Present Illness: Nicholas Caldwell is a 71 y.o. male  Who was first evaluated by [name] in 12/15.  He was found to have a nonischemic cardiomyopathy dx 5/07, systolic HF, AFib/Flutter, HTN, HL, diabetes, tobacco abuse. Cardiac catheterization January 2016 demonstrated mild nonobstructive CAD.   Admitted in 2/25 with a/c systolic HF and elevated Troponin levels 2/2 demand ischemia in the setting of atrial fibrillation/flutter with RVR. CHADS2-VASc=4. He was placed on Xarelto for anticoagulation. He was seen by EP and was not felt to be a candidate for flecainide secondary cardiomyopathy. He was not felt to be a candidate for Tikosyn given his prolonged QT. Success of ablation would be limited due to left atrial size. He was screened for Genetic AF and was not felt to be a good candidate. It was ultimately decided to place him on amiodarone for rhythm control.   He has had transient episodes of AFlutter noted in the office in the past. We adjusted his Amiodarone. We had him FU with EP to see if he can be considered for RFCA as his arrhythmia may be contributing to his DCM. He saw Dr. Rayann Heman 10/01/15. Apparently he declined ICD at that time.   In 3/17, he was again back in AFlutter in the office.  Dr. Rayann Heman weighed in with [name]. He felt the patient would not benefit from RFCA of AFlutter as this would likely result in more AFib. He felt like the patient should be continued on rate control Rx. He was seen in 5/17.  He was volume overloaded and I adjusted his diuretics.  He was Admitted in 7/17 for AECOPD. He was then admitted  8/27-9/1 with a/c hypoxemic hypercarbic respiratory failure felt to be secondary to CHF and AECOPD. Of note, his Amiodarone was DC'd due to prolonged QT during this admission.  He FU in our clinic with [name], PA-C on 9/19.  He was volume overloaded.  It was questioned if he was adherent with his diuretics and the dose was adjusted with plans for close FU.  He was then admitted 7/50-51/8 with a/c systolic CHF in the setting of AECOPD with assoc AF with RVR.  He was followed by Cardiology.  He diuresed > 25L.  DC weight was 285 lbs (notes indicate questionable accuracy with his weights).  He was tx with rate control for his AFib.  He returns for close post hospital FU.  Of note, [facility] has tried unsuccessfully x 3 to FU with him since DC.    He has been seen by [name] for heart failure while in the hospital.   He returns for FU.  He is here with his wife.  He denies any chest pain, syncope.  His shortness of breath is unchanged. He sleeps on 2 pillows.  He denies PND.  He denies any bleeding issues. He has a chronic cough.   He was hospitalized in January 2018 for acute heart failure. Ejection fraction was approximately 35%. He was followed by the heart failure team while in the  hospital. He was diuresed about 12 L at that time.  He has been referred to the heart failure clinic in the past but missed the appointment. For some reason, his follow-up was set up in our office. Ultimately, he should follow-up in the heart failure clinic.    Past Medical History:  Diagnosis Date  . Atrial flutter ([place])    a. recurrent AFlutter with RVR;  b. Amiodarone Rx started 4/16  . CAD (coronary artery disease)    a. LHC 1/16:  mLAD diffuse disease, pLCx mild disease, dLCx with disease but too small for PCI, RCA ok, EF 25-30%  . Chronic pain   . Chronic systolic CHF (congestive heart failure) ([place])   . COPD (chronic obstructive pulmonary disease) ([place])   . Diabetes mellitus without complication  ([place])   . Hypercholesteremia   . Hypertension   . NICM (nonischemic cardiomyopathy) ([place])    a.dx 2016. b. 2D echo 06/2016 - Last echo 07/01/16: mod dilated LV, mod LVH, EF 25-30%, mild-mod MR, sev LAE, mild-mod reduced RV systolic function, mild-mod TR, PASP 39mHG.  .Marland KitchenPAF (paroxysmal atrial fibrillation) ([facility])    On amio - ot a candidate for flecainide due to cardiomyopathy, not a candidate for Tikosyn due to prolonged QT, and felt to be a poor candidate for ablation given left atrial size.  . Prolonged Q-T interval on ECG   . Pulmonary hypertension   . Tobacco abuse     Past Surgical History:  Procedure Laterality Date  . LEFT AND RIGHT HEART CATHETERIZATION WITH CORONARY ANGIOGRAM N/A 11/06/2014   Procedure: LEFT AND RIGHT HEART CATHETERIZATION WITH CORONARY ANGIOGRAM;  Surgeon: JJettie Booze MD;  [facility];  Service: Cardiovascular;  Laterality: N/A;  . SPLENECTOMY       Current Outpatient Prescriptions  Medication Sig Dispense Refill  . albuterol (PROVENTIL HFA;VENTOLIN HFA) 108 (90 BASE) MCG/ACT inhaler Inhale 2 puffs into the lungs every 6 (six) hours as needed for wheezing or shortness of breath. 1 Inhaler 2  . amiodarone (PACERONE) 400 MG tablet Take 400 mg by mouth daily for 1 wee, then take 200 mg by mouth daily. 60 tablet 0  . carvedilol (COREG) 12.5 MG tablet Take 1 tablet (12.5 mg total) by mouth 2 (two) times daily with a meal. 60 tablet 0  . diazepam (VALIUM) 10 MG tablet Take 0.5 tablets (5 mg total) by mouth daily as needed for anxiety or sleep. 30 tablet 0  . diclofenac sodium (VOLTAREN) 1 % GEL Apply 2 g topically 2 (two) times daily as needed (for pain).    .Marland Kitchendigoxin (LANOXIN) 0.125 MG tablet Take 1 tablet (0.125 mg total) by mouth daily. 30 tablet 11  . fluticasone (FLONASE) 50 MCG/ACT nasal spray Place 2 sprays into both nostrils daily as needed for allergies.     .Marland Kitchengabapentin (NEURONTIN) 600 MG tablet Take 1-3 tablets (600-1,800 mg total) by mouth  See admin instructions. Take 1 tablet (600 mg) by mouth daily with lunch,3 tablets (1800 mg) at bedtime 30 tablet 0  . gabapentin (NEURONTIN) 800 MG tablet Take 800 mg by mouth as directed.  6  . glipiZIDE (GLUCOTROL) 10 MG tablet Take 10 mg by mouth 2 (two) times daily before a meal.     . glucose 4 GM chewable tablet Chew 4 tablets by mouth See admin instructions. Chew 4 tablets by mouth as needed for low blood sugar - repeat every 15 minutes if blood sugar less than 70    .  hydrocortisone (ANUSOL-HC) 2.5 % rectal cream Place 1 application rectally 2 (two) times daily as needed for hemorrhoids or itching. Use up to 2 weeks at a time as needed    . hydrocortisone 1 % lotion Apply 1 application topically See admin instructions. Apply small amount to back twice daily for itchy rash    . insulin detemir (LEVEMIR) 100 UNIT/ML injection Inject 0.2 mLs (20 Units total) into the skin every evening. 10 mL 11  . ipratropium (ATROVENT) 0.02 % nebulizer solution Take 2.5 mLs (0.5 mg total) by nebulization every 6 (six) hours as needed for wheezing or shortness of breath. 75 mL 12  . magnesium oxide (MAG-OX) 400 (241.3 Mg) MG tablet Take 1 tablet (400 mg total) by mouth 2 (two) times daily. 30 tablet 0  . metFORMIN (GLUCOPHAGE-XR) 500 MG 24 hr tablet Take 1,000 mg by mouth 2 (two) times daily with a meal.    . methadone (DOLOPHINE) 10 MG tablet Take 1 tablet (10 mg total) by mouth every 12 (twelve) hours. 1 tablet 0  . mometasone-formoterol (DULERA) 200-5 MCG/ACT AERO Inhale 2 puffs into the lungs 2 (two) times daily.    . potassium chloride SA (K-DUR,KLOR-CON) 20 MEQ tablet Take 2 tablets (40 mEq total) by mouth daily. 30 tablet 0  . PRESCRIPTION MEDICATION Pt states he has been taking a antibiotic for the last few days, pt states it was for his cold, called his [facility] where he said he had it filled and they haven't filled it there, pt said he doesn't care if we clarify it or not, wont be bringing in  medication    . rivaroxaban (XARELTO) 20 MG TABS tablet Take 1 tablet (20 mg total) by mouth daily with supper. 30 tablet 0  . rosuvastatin (CRESTOR) 40 MG tablet Take 20 mg by mouth daily. For cholesterol    . sacubitril-valsartan (ENTRESTO) 49-51 MG Take 1 tablet by mouth 2 (two) times daily. 60 tablet 11  . spironolactone (ALDACTONE) 25 MG tablet Take 1 tablet (25 mg total) by mouth daily. 30 tablet 11  . torsemide (DEMADEX) 20 MG tablet Take 4 tablets (80 mg total) by mouth daily. 90 tablet 0   No current facility-administered medications for this visit.     Allergies:   Patient has no known allergies.    Social History:  The patient  reports that he has been smoking Cigarettes.  He has been smoking about 0.05 packs per day. He has never used smokeless tobacco. He reports that he does not drink alcohol or use drugs.   Family History:  The patient's family history includes Heart disease in his father and mother; Heart failure in his mother; Hypertension in his mother and sister.    ROS:  Please see the history of present illness.   Otherwise, review of systems are positive for leg swelling.   All other systems are reviewed and negative.    PHYSICAL EXAM: VS:  BP (!) 160/72   Pulse 83   Ht 6' (1.829 m)   Wt 284 lb 6.4 oz (129 kg)   BMI 38.57 kg/m  , BMI Body mass index is 38.57 kg/m. GEN: Well nourished, well developed, in no acute distress  HEENT: normal  Neck: no JVD, carotid bruits, or masses Cardiac: RRR, occasionalk premature beats; no murmurs, rubs, or gallops,; bilateral pitting LE edema  Respiratory:  clear to auscultation bilaterally, normal work of breathing GI: soft, nontender, nondistended, + BS MS: no deformity or atrophy  Skin: warm  and dry, no rash Neuro:  Strength and sensation are intact Psych: euthymic mood, full affect   EKG:   The ekg ordered 11/05/2016 demonstrates normal sinus rhythm, no ST segment changes   Recent Labs: 07/28/2016: ALT 22; TSH  1.745 10/30/2016: B Natriuretic Peptide 594.4 11/05/2016: BUN 10; Creatinine, Ser 0.90; Hemoglobin 10.8; Magnesium 1.7; Platelets 404; Potassium 3.9; Sodium 138   Lipid Panel    Component Value Date/Time   CHOL 124 11/01/2016 0851   TRIG 95 11/01/2016 0851   HDL 38 (L) 11/01/2016 0851   CHOLHDL 3.3 11/01/2016 0851   VLDL 19 11/01/2016 0851   LDLCALC 67 11/01/2016 0851     Other studies Reviewed: Additional studies/ records that were reviewed today with results demonstrating: hospital recordds.   ASSESSMENT AND PLAN:   1. Chronic systolic heart failure:  NICM. Continue medical therapy of systolic heart failure. He is on entresto, spironolactone and torsemide.  He currently appears euvolemic. I counseled him about minimizing salt.  Long-term, he would benefit from follow-up in the heart failure clinic. He already has an appointment scheduled for Thursday with [name]. 2. AFib: Maintaining sinus rhythm on current medications at the time of discharge.  There was some irregularity on exam today. ECG showed atrial flutter with 3-1 conduction. He remains on Xarelto for stroke prevention.  Although amiodarone was used in the past and stop due to prolonged QT, it was restarted at the time of his last hospitalization. I will defer to the heart failure team since they were managing him in the hospital. 3. OSA: Used CPAP in the hospital but not at home. Will send back to Dr. Claiborne Billings who he initially saw for a sleep study. 4. He also would like nebulizer medications. I will cc the pulmonologist that he saw in the hospital.  He is on home oxygen. 5. He is on his last dose of magnesium for today. I would have him get blood work checked at his Thursday appointment. If magnesium is low, this may need to be refilled.     Current medicines are reviewed at length with the patient today.  The patient concerns regarding his medicines were addressed.  The following changes have been made:  No change  Labs/  tests ordered today include:  No orders of the defined types were placed in this encounter.   Recommend 150 minutes/week of aerobic exercise Low fat, low carb, high fiber diet recommended  Disposition:   FU with me as needed. Long-term follow-up should be with the heart failure clinic. Sleep apnea follow-up should be with Dr. Claiborne Billings since he has seen him in the past.   Signed, Larae Grooms, MD  11/24/2016 1:56 PM    [address], [place], [place]  [address] Phone: [phone]; Fax: [phone]

## 2016-11-24 ENCOUNTER — Encounter: Payer: Self-pay | Admitting: Interventional Cardiology

## 2016-11-24 ENCOUNTER — Ambulatory Visit (INDEPENDENT_AMBULATORY_CARE_PROVIDER_SITE_OTHER): Payer: Medicare Other | Admitting: Interventional Cardiology

## 2016-11-24 VITALS — BP 160/72 | HR 83 | Ht 72.0 in | Wt 284.4 lb

## 2016-11-24 DIAGNOSIS — I5022 Chronic systolic (congestive) heart failure: Secondary | ICD-10-CM | POA: Diagnosis not present

## 2016-11-24 DIAGNOSIS — I428 Other cardiomyopathies: Secondary | ICD-10-CM | POA: Diagnosis not present

## 2016-11-24 DIAGNOSIS — I10 Essential (primary) hypertension: Secondary | ICD-10-CM | POA: Diagnosis not present

## 2016-11-24 DIAGNOSIS — G4733 Obstructive sleep apnea (adult) (pediatric): Secondary | ICD-10-CM | POA: Diagnosis not present

## 2016-11-24 MED ORDER — AMIODARONE HCL 400 MG PO TABS: 200.0000 mg | ORAL_TABLET | Freq: Every day | ORAL | 3 refills | Status: DC

## 2016-11-24 NOTE — Patient Instructions (Addendum)
Medication Instructions:  Same-no changes  Labwork: None  Testing/Procedures: None  Follow-Up: Your physician recommends that you schedule a follow-up appointment in: as already planned with our CHF Clinic.   Dr [name] is referring you to Dr Claiborne Billings for sleep apnea  If you need a refill on your cardiac medications before your next appointment, please call your pharmacy.

## 2016-11-27 DIAGNOSIS — I5023 Acute on chronic systolic (congestive) heart failure: Secondary | ICD-10-CM | POA: Diagnosis not present

## 2016-11-27 DIAGNOSIS — I480 Paroxysmal atrial fibrillation: Secondary | ICD-10-CM | POA: Diagnosis not present

## 2016-11-27 DIAGNOSIS — J449 Chronic obstructive pulmonary disease, unspecified: Secondary | ICD-10-CM | POA: Diagnosis not present

## 2016-11-27 DIAGNOSIS — I110 Hypertensive heart disease with heart failure: Secondary | ICD-10-CM | POA: Diagnosis not present

## 2016-11-27 DIAGNOSIS — J9621 Acute and chronic respiratory failure with hypoxia: Secondary | ICD-10-CM | POA: Diagnosis not present

## 2016-11-27 DIAGNOSIS — E1165 Type 2 diabetes mellitus with hyperglycemia: Secondary | ICD-10-CM | POA: Diagnosis not present

## 2016-12-02 ENCOUNTER — Encounter (HOSPITAL_COMMUNITY): Payer: Medicare Other

## 2016-12-02 ENCOUNTER — Ambulatory Visit: Payer: Medicare Other | Admitting: Interventional Cardiology

## 2016-12-05 ENCOUNTER — Inpatient Hospital Stay (HOSPITAL_COMMUNITY): Admission: RE | Admit: 2016-12-05 | Payer: Medicare Other | Source: Ambulatory Visit

## 2016-12-12 ENCOUNTER — Encounter (HOSPITAL_COMMUNITY): Payer: Medicare Other

## 2016-12-13 ENCOUNTER — Encounter (HOSPITAL_COMMUNITY): Payer: Self-pay | Admitting: Emergency Medicine

## 2016-12-13 ENCOUNTER — Inpatient Hospital Stay (HOSPITAL_COMMUNITY): Payer: Medicare Other

## 2016-12-13 ENCOUNTER — Emergency Department (HOSPITAL_COMMUNITY): Payer: Medicare Other

## 2016-12-13 ENCOUNTER — Inpatient Hospital Stay (HOSPITAL_COMMUNITY)
Admission: EM | Admit: 2016-12-13 | Discharge: 2016-12-19 | DRG: 291 | Disposition: A | Discharge status: Home Health | Payer: Medicare Other | Attending: Internal Medicine | Admitting: Internal Medicine

## 2016-12-13 DIAGNOSIS — J441 Chronic obstructive pulmonary disease with (acute) exacerbation: Secondary | ICD-10-CM | POA: Diagnosis not present

## 2016-12-13 DIAGNOSIS — J9621 Acute and chronic respiratory failure with hypoxia: Secondary | ICD-10-CM | POA: Diagnosis not present

## 2016-12-13 DIAGNOSIS — I272 Pulmonary hypertension, unspecified: Secondary | ICD-10-CM | POA: Diagnosis present

## 2016-12-13 DIAGNOSIS — J9601 Acute respiratory failure with hypoxia: Secondary | ICD-10-CM

## 2016-12-13 DIAGNOSIS — J9602 Acute respiratory failure with hypercapnia: Secondary | ICD-10-CM | POA: Diagnosis not present

## 2016-12-13 DIAGNOSIS — F17210 Nicotine dependence, cigarettes, uncomplicated: Secondary | ICD-10-CM | POA: Diagnosis present

## 2016-12-13 DIAGNOSIS — E7800 Pure hypercholesterolemia, unspecified: Secondary | ICD-10-CM | POA: Diagnosis present

## 2016-12-13 DIAGNOSIS — G9340 Encephalopathy, unspecified: Secondary | ICD-10-CM

## 2016-12-13 DIAGNOSIS — I480 Paroxysmal atrial fibrillation: Secondary | ICD-10-CM | POA: Diagnosis not present

## 2016-12-13 DIAGNOSIS — Z9289 Personal history of other medical treatment: Secondary | ICD-10-CM

## 2016-12-13 DIAGNOSIS — I472 Ventricular tachycardia: Secondary | ICD-10-CM | POA: Diagnosis not present

## 2016-12-13 DIAGNOSIS — R570 Cardiogenic shock: Secondary | ICD-10-CM | POA: Diagnosis not present

## 2016-12-13 DIAGNOSIS — I4891 Unspecified atrial fibrillation: Secondary | ICD-10-CM | POA: Diagnosis not present

## 2016-12-13 DIAGNOSIS — R069 Unspecified abnormalities of breathing: Secondary | ICD-10-CM

## 2016-12-13 DIAGNOSIS — R918 Other nonspecific abnormal finding of lung field: Secondary | ICD-10-CM | POA: Diagnosis not present

## 2016-12-13 DIAGNOSIS — I4581 Long QT syndrome: Secondary | ICD-10-CM

## 2016-12-13 DIAGNOSIS — E785 Hyperlipidemia, unspecified: Secondary | ICD-10-CM | POA: Diagnosis present

## 2016-12-13 DIAGNOSIS — Z6834 Body mass index (BMI) 34.0-34.9, adult: Secondary | ICD-10-CM | POA: Diagnosis not present

## 2016-12-13 DIAGNOSIS — J449 Chronic obstructive pulmonary disease, unspecified: Secondary | ICD-10-CM | POA: Diagnosis present

## 2016-12-13 DIAGNOSIS — R0602 Shortness of breath: Secondary | ICD-10-CM | POA: Diagnosis not present

## 2016-12-13 DIAGNOSIS — E876 Hypokalemia: Secondary | ICD-10-CM | POA: Diagnosis present

## 2016-12-13 DIAGNOSIS — J9622 Acute and chronic respiratory failure with hypercapnia: Secondary | ICD-10-CM | POA: Diagnosis present

## 2016-12-13 DIAGNOSIS — I4721 Torsades de pointes: Secondary | ICD-10-CM

## 2016-12-13 DIAGNOSIS — Z7901 Long term (current) use of anticoagulants: Secondary | ICD-10-CM

## 2016-12-13 DIAGNOSIS — E1142 Type 2 diabetes mellitus with diabetic polyneuropathy: Secondary | ICD-10-CM | POA: Diagnosis not present

## 2016-12-13 DIAGNOSIS — Z4682 Encounter for fitting and adjustment of non-vascular catheter: Secondary | ICD-10-CM | POA: Diagnosis not present

## 2016-12-13 DIAGNOSIS — J810 Acute pulmonary edema: Secondary | ICD-10-CM | POA: Diagnosis not present

## 2016-12-13 DIAGNOSIS — B349 Viral infection, unspecified: Secondary | ICD-10-CM | POA: Diagnosis present

## 2016-12-13 DIAGNOSIS — Z79891 Long term (current) use of opiate analgesic: Secondary | ICD-10-CM

## 2016-12-13 DIAGNOSIS — G4733 Obstructive sleep apnea (adult) (pediatric): Secondary | ICD-10-CM | POA: Diagnosis present

## 2016-12-13 DIAGNOSIS — Z794 Long term (current) use of insulin: Secondary | ICD-10-CM

## 2016-12-13 DIAGNOSIS — Z8249 Family history of ischemic heart disease and other diseases of the circulatory system: Secondary | ICD-10-CM

## 2016-12-13 DIAGNOSIS — R197 Diarrhea, unspecified: Secondary | ICD-10-CM | POA: Diagnosis present

## 2016-12-13 DIAGNOSIS — I5043 Acute on chronic combined systolic (congestive) and diastolic (congestive) heart failure: Secondary | ICD-10-CM | POA: Diagnosis not present

## 2016-12-13 DIAGNOSIS — I471 Supraventricular tachycardia, unspecified: Secondary | ICD-10-CM

## 2016-12-13 DIAGNOSIS — Z9119 Patient's noncompliance with other medical treatment and regimen: Secondary | ICD-10-CM

## 2016-12-13 DIAGNOSIS — I5023 Acute on chronic systolic (congestive) heart failure: Secondary | ICD-10-CM

## 2016-12-13 DIAGNOSIS — I2510 Atherosclerotic heart disease of native coronary artery without angina pectoris: Secondary | ICD-10-CM | POA: Diagnosis present

## 2016-12-13 DIAGNOSIS — N179 Acute kidney failure, unspecified: Secondary | ICD-10-CM | POA: Diagnosis not present

## 2016-12-13 DIAGNOSIS — I110 Hypertensive heart disease with heart failure: Principal | ICD-10-CM | POA: Diagnosis present

## 2016-12-13 DIAGNOSIS — I4892 Unspecified atrial flutter: Secondary | ICD-10-CM | POA: Diagnosis present

## 2016-12-13 DIAGNOSIS — Z7951 Long term (current) use of inhaled steroids: Secondary | ICD-10-CM

## 2016-12-13 DIAGNOSIS — I428 Other cardiomyopathies: Secondary | ICD-10-CM

## 2016-12-13 DIAGNOSIS — G8929 Other chronic pain: Secondary | ICD-10-CM | POA: Diagnosis present

## 2016-12-13 DIAGNOSIS — Z6839 Body mass index (BMI) 39.0-39.9, adult: Secondary | ICD-10-CM

## 2016-12-13 DIAGNOSIS — Z79899 Other long term (current) drug therapy: Secondary | ICD-10-CM

## 2016-12-13 DIAGNOSIS — I959 Hypotension, unspecified: Secondary | ICD-10-CM

## 2016-12-13 DIAGNOSIS — D649 Anemia, unspecified: Secondary | ICD-10-CM | POA: Diagnosis present

## 2016-12-13 DIAGNOSIS — J9600 Acute respiratory failure, unspecified whether with hypoxia or hypercapnia: Secondary | ICD-10-CM | POA: Diagnosis present

## 2016-12-13 DIAGNOSIS — I255 Ischemic cardiomyopathy: Secondary | ICD-10-CM | POA: Diagnosis present

## 2016-12-13 DIAGNOSIS — Z9981 Dependence on supplemental oxygen: Secondary | ICD-10-CM

## 2016-12-13 DIAGNOSIS — I5022 Chronic systolic (congestive) heart failure: Secondary | ICD-10-CM | POA: Diagnosis not present

## 2016-12-13 DIAGNOSIS — G9341 Metabolic encephalopathy: Secondary | ICD-10-CM

## 2016-12-13 DIAGNOSIS — Z9081 Acquired absence of spleen: Secondary | ICD-10-CM

## 2016-12-13 DIAGNOSIS — J9690 Respiratory failure, unspecified, unspecified whether with hypoxia or hypercapnia: Secondary | ICD-10-CM | POA: Diagnosis not present

## 2016-12-13 DIAGNOSIS — Z791 Long term (current) use of non-steroidal anti-inflammatories (NSAID): Secondary | ICD-10-CM

## 2016-12-13 DIAGNOSIS — R0689 Other abnormalities of breathing: Secondary | ICD-10-CM | POA: Diagnosis not present

## 2016-12-13 DIAGNOSIS — I483 Typical atrial flutter: Secondary | ICD-10-CM | POA: Diagnosis not present

## 2016-12-13 LAB — URINALYSIS, ROUTINE W REFLEX MICROSCOPIC
BACTERIA UA: NONE SEEN
BILIRUBIN URINE: NEGATIVE
Glucose, UA: >=500 mg/dL — AB
Hgb urine dipstick: NEGATIVE
KETONES UR: NEGATIVE mg/dL
Leukocytes, UA: NEGATIVE
Nitrite: NEGATIVE
PH: 5 (ref 5.0–8.0)
PROTEIN: 100 mg/dL — AB
Specific Gravity, Urine: 1.01 (ref 1.005–1.030)
Squamous Epithelial / HPF: NONE SEEN

## 2016-12-13 LAB — CBC WITH DIFFERENTIAL/PLATELET
BASOS PCT: 1 %
BASOS PCT: 1 %
Basophils Absolute: 0.1 10*3/uL (ref 0.0–0.1)
Basophils Absolute: 0.1 10*3/uL (ref 0.0–0.1)
EOS PCT: 1 %
Eosinophils Absolute: 0.1 10*3/uL (ref 0.0–0.7)
Eosinophils Absolute: 0 10*3/uL (ref 0.0–0.7)
Eosinophils Relative: 0 %
HEMATOCRIT: 42.9 % (ref 39.0–52.0)
HEMATOCRIT: 38.7 % — AB (ref 39.0–52.0)
HEMOGLOBIN: 12.5 g/dL — AB (ref 13.0–17.0)
Hemoglobin: 11.5 g/dL — ABNORMAL LOW (ref 13.0–17.0)
LYMPHS PCT: 31 %
LYMPHS PCT: 19 %
Lymphs Abs: 2.5 10*3/uL (ref 0.7–4.0)
Lymphs Abs: 1.3 10*3/uL (ref 0.7–4.0)
MCH: 26 pg (ref 26.0–34.0)
MCH: 25.8 pg — AB (ref 26.0–34.0)
MCHC: 29.1 g/dL — AB (ref 30.0–36.0)
MCHC: 29.7 g/dL — ABNORMAL LOW (ref 30.0–36.0)
MCV: 89.4 fL (ref 78.0–100.0)
MCV: 86.8 fL (ref 78.0–100.0)
MONOS PCT: 5 %
MONOS PCT: 6 %
Monocytes Absolute: 0.4 10*3/uL (ref 0.1–1.0)
Monocytes Absolute: 0.4 10*3/uL (ref 0.1–1.0)
NEUTROS ABS: 5.2 10*3/uL (ref 1.7–7.7)
NEUTROS PCT: 62 %
NEUTROS PCT: 74 %
Neutro Abs: 4.9 10*3/uL (ref 1.7–7.7)
Platelets: 362 10*3/uL (ref 150–400)
Platelets: 420 10*3/uL — ABNORMAL HIGH (ref 150–400)
RBC: 4.8 MIL/uL (ref 4.22–5.81)
RBC: 4.46 MIL/uL (ref 4.22–5.81)
RDW: 16.9 % — ABNORMAL HIGH (ref 11.5–15.5)
RDW: 16.5 % — ABNORMAL HIGH (ref 11.5–15.5)
WBC: 8 10*3/uL (ref 4.0–10.5)
WBC: 7 10*3/uL (ref 4.0–10.5)

## 2016-12-13 LAB — CBC
HCT: 35.2 % — ABNORMAL LOW (ref 39.0–52.0)
Hemoglobin: 10.4 g/dL — ABNORMAL LOW (ref 13.0–17.0)
MCH: 25.5 pg — ABNORMAL LOW (ref 26.0–34.0)
MCHC: 29.5 g/dL — ABNORMAL LOW (ref 30.0–36.0)
MCV: 86.3 fL (ref 78.0–100.0)
PLATELETS: 397 10*3/uL (ref 150–400)
RBC: 4.08 MIL/uL — AB (ref 4.22–5.81)
RDW: 16.3 % — AB (ref 11.5–15.5)
WBC: 6.5 10*3/uL (ref 4.0–10.5)

## 2016-12-13 LAB — MAGNESIUM
MAGNESIUM: 2.1 mg/dL (ref 1.7–2.4)
MAGNESIUM: 3.3 mg/dL — AB (ref 1.7–2.4)
Magnesium: 2.9 mg/dL — ABNORMAL HIGH (ref 1.7–2.4)

## 2016-12-13 LAB — BLOOD GAS, ARTERIAL
ACID-BASE EXCESS: 7.8 mmol/L — AB (ref 0.0–2.0)
BICARBONATE: 36.5 mmol/L — AB (ref 20.0–28.0)
DRAWN BY: 441661
Delivery systems: POSITIVE
EXPIRATORY PAP: 5
FIO2: 60
INSPIRATORY PAP: 11
Mode: POSITIVE
O2 Saturation: 92.2 %
PATIENT TEMPERATURE: 98.6
PCO2 ART: 109 mmHg — AB (ref 32.0–48.0)
RATE: 15 resp/min
pH, Arterial: 7.151 — CL (ref 7.350–7.450)
pO2, Arterial: 87 mmHg (ref 83.0–108.0)

## 2016-12-13 LAB — I-STAT CG4 LACTIC ACID, ED: LACTIC ACID, VENOUS: 2.49 mmol/L — AB (ref 0.5–1.9)

## 2016-12-13 LAB — PHOSPHORUS
PHOSPHORUS: 3.7 mg/dL (ref 2.5–4.6)
Phosphorus: 1 mg/dL — CL (ref 2.5–4.6)

## 2016-12-13 LAB — TROPONIN I
Troponin I: 0.03 ng/mL (ref <0.03)
Troponin I: 0.06 ng/mL (ref <0.03)

## 2016-12-13 LAB — POCT I-STAT, CHEM 8
BUN: 10 mg/dL (ref 6–20)
CREATININE: 0.8 mg/dL (ref 0.61–1.24)
Calcium, Ion: 1.08 mmol/L — ABNORMAL LOW (ref 1.15–1.40)
Chloride: 96 mmol/L — ABNORMAL LOW (ref 101–111)
Glucose, Bld: 242 mg/dL — ABNORMAL HIGH (ref 65–99)
HEMATOCRIT: 38 % — AB (ref 39.0–52.0)
HEMOGLOBIN: 12.9 g/dL — AB (ref 13.0–17.0)
Potassium: 3.3 mmol/L — ABNORMAL LOW (ref 3.5–5.1)
SODIUM: 146 mmol/L — AB (ref 135–145)
TCO2: 33 mmol/L (ref 0–100)

## 2016-12-13 LAB — POCT I-STAT 3, ART BLOOD GAS (G3+)
Acid-Base Excess: 11 mmol/L — ABNORMAL HIGH (ref 0.0–2.0)
BICARBONATE: 33.9 mmol/L — AB (ref 20.0–28.0)
O2 Saturation: 97 %
PCO2 ART: 37.6 mmHg (ref 32.0–48.0)
TCO2: 35 mmol/L (ref 0–100)
pH, Arterial: 7.563 — ABNORMAL HIGH (ref 7.350–7.450)
pO2, Arterial: 80 mmHg — ABNORMAL LOW (ref 83.0–108.0)

## 2016-12-13 LAB — COMPREHENSIVE METABOLIC PANEL
ALBUMIN: 2.9 g/dL — AB (ref 3.5–5.0)
ALT: 17 U/L (ref 17–63)
AST: 37 U/L (ref 15–41)
Alkaline Phosphatase: 124 U/L (ref 38–126)
Anion gap: 11 (ref 5–15)
BILIRUBIN TOTAL: 0.6 mg/dL (ref 0.3–1.2)
CHLORIDE: 97 mmol/L — AB (ref 101–111)
CO2: 34 mmol/L — AB (ref 22–32)
Calcium: 8.3 mg/dL — ABNORMAL LOW (ref 8.9–10.3)
Creatinine, Ser: 0.76 mg/dL (ref 0.61–1.24)
GFR calc Af Amer: >60 mL/min (ref >60)
GFR calc non Af Amer: >60 mL/min (ref >60)
GLUCOSE: 318 mg/dL — AB (ref 65–99)
POTASSIUM: 3.6 mmol/L (ref 3.5–5.1)
SODIUM: 142 mmol/L (ref 135–145)
TOTAL PROTEIN: 7.4 g/dL (ref 6.5–8.1)

## 2016-12-13 LAB — BASIC METABOLIC PANEL
ANION GAP: 13 (ref 5–15)
ANION GAP: 11 (ref 5–15)
ANION GAP: 10 (ref 5–15)
BUN: 6 mg/dL (ref 6–20)
BUN: 8 mg/dL (ref 6–20)
BUN: 8 mg/dL (ref 6–20)
CALCIUM: 8.7 mg/dL — AB (ref 8.9–10.3)
CALCIUM: 8.4 mg/dL — AB (ref 8.9–10.3)
CHLORIDE: 96 mmol/L — AB (ref 101–111)
CHLORIDE: 101 mmol/L (ref 101–111)
CO2: 31 mmol/L (ref 22–32)
CO2: 31 mmol/L (ref 22–32)
CO2: 30 mmol/L (ref 22–32)
Calcium: 8.6 mg/dL — ABNORMAL LOW (ref 8.9–10.3)
Chloride: 102 mmol/L (ref 101–111)
Creatinine, Ser: 0.74 mg/dL (ref 0.61–1.24)
Creatinine, Ser: 0.91 mg/dL (ref 0.61–1.24)
Creatinine, Ser: 1 mg/dL (ref 0.61–1.24)
GFR calc Af Amer: >60 mL/min (ref >60)
GFR calc non Af Amer: >60 mL/min (ref >60)
GLUCOSE: 248 mg/dL — AB (ref 65–99)
Glucose, Bld: 357 mg/dL — ABNORMAL HIGH (ref 65–99)
Glucose, Bld: 183 mg/dL — ABNORMAL HIGH (ref 65–99)
POTASSIUM: 3.7 mmol/L (ref 3.5–5.1)
POTASSIUM: 4.2 mmol/L (ref 3.5–5.1)
POTASSIUM: 3.8 mmol/L (ref 3.5–5.1)
SODIUM: 140 mmol/L (ref 135–145)
Sodium: 143 mmol/L (ref 135–145)
Sodium: 142 mmol/L (ref 135–145)

## 2016-12-13 LAB — GLUCOSE, CAPILLARY
GLUCOSE-CAPILLARY: 265 mg/dL — AB (ref 65–99)
Glucose-Capillary: 344 mg/dL — ABNORMAL HIGH (ref 65–99)
Glucose-Capillary: 218 mg/dL — ABNORMAL HIGH (ref 65–99)
Glucose-Capillary: 213 mg/dL — ABNORMAL HIGH (ref 65–99)
Glucose-Capillary: 187 mg/dL — ABNORMAL HIGH (ref 65–99)

## 2016-12-13 LAB — I-STAT CHEM 8, ED
BUN: 5 mg/dL — ABNORMAL LOW (ref 6–20)
CREATININE: 0.7 mg/dL (ref 0.61–1.24)
Calcium, Ion: 1.1 mmol/L — ABNORMAL LOW (ref 1.15–1.40)
Chloride: 100 mmol/L — ABNORMAL LOW (ref 101–111)
Glucose, Bld: 315 mg/dL — ABNORMAL HIGH (ref 65–99)
HCT: 45 % (ref 39.0–52.0)
HEMOGLOBIN: 15.3 g/dL (ref 13.0–17.0)
POTASSIUM: 3.5 mmol/L (ref 3.5–5.1)
Sodium: 144 mmol/L (ref 135–145)
TCO2: 34 mmol/L (ref 0–100)

## 2016-12-13 LAB — I-STAT ARTERIAL BLOOD GAS, ED
Acid-Base Excess: 8 mmol/L — ABNORMAL HIGH (ref 0.0–2.0)
BICARBONATE: 33.6 mmol/L — AB (ref 20.0–28.0)
O2 Saturation: 99 %
PH ART: 7.429 (ref 7.350–7.450)
PO2 ART: 153 mmHg — AB (ref 83.0–108.0)
TCO2: 35 mmol/L (ref 0–100)
pCO2 arterial: 50.6 mmHg — ABNORMAL HIGH (ref 32.0–48.0)

## 2016-12-13 LAB — PROCALCITONIN: PROCALCITONIN: 0.28 ng/mL

## 2016-12-13 LAB — INFLUENZA PANEL BY PCR (TYPE A & B)
INFLAPCR: NEGATIVE
Influenza B By PCR: NEGATIVE

## 2016-12-13 LAB — BRAIN NATRIURETIC PEPTIDE: B Natriuretic Peptide: 555.1 pg/mL — ABNORMAL HIGH (ref 0.0–100.0)

## 2016-12-13 LAB — I-STAT TROPONIN, ED: TROPONIN I, POC: 0.03 ng/mL (ref 0.00–0.08)

## 2016-12-13 LAB — MRSA PCR SCREENING: MRSA BY PCR: NEGATIVE

## 2016-12-13 LAB — LACTIC ACID, PLASMA: Lactic Acid, Venous: 2.8 mmol/L (ref 0.5–1.9)

## 2016-12-13 LAB — CBG MONITORING, ED: Glucose-Capillary: 342 mg/dL — ABNORMAL HIGH (ref 65–99)

## 2016-12-13 MED ORDER — LIDOCAINE HCL (CARDIAC) 20 MG/ML IV SOLN
100.0000 mg | Freq: Once | INTRAVENOUS | Status: AC
  Administered 2016-12-13: 100 mg via INTRAVENOUS

## 2016-12-13 MED ORDER — SODIUM CHLORIDE 0.9 % IV SOLN
25.0000 ug/h | INTRAVENOUS | Status: DC
  Administered 2016-12-13 (×2): 400 ug/h via INTRAVENOUS
  Administered 2016-12-13: 50 ug/h via INTRAVENOUS
  Administered 2016-12-14 (×3): 400 ug/h via INTRAVENOUS
  Administered 2016-12-15: 300 ug/h via INTRAVENOUS
  Administered 2016-12-15: 200 ug/h via INTRAVENOUS
  Administered 2016-12-15: 400 ug/h via INTRAVENOUS
  Administered 2016-12-16: 300 ug/h via INTRAVENOUS
  Filled 2016-12-13 (×11): qty 50

## 2016-12-13 MED ORDER — MIDAZOLAM BOLUS VIA INFUSION
1.0000 mg | INTRAVENOUS | Status: DC | PRN
  Administered 2016-12-13: 2 mg via INTRAVENOUS
  Filled 2016-12-13: qty 2

## 2016-12-13 MED ORDER — LIDOCAINE HCL (CARDIAC) 20 MG/ML IV SOLN
50.0000 mg | Freq: Once | INTRAVENOUS | Status: AC
  Administered 2016-12-13: 50 mg via INTRAVENOUS

## 2016-12-13 MED ORDER — MIDAZOLAM HCL 5 MG/ML IJ SOLN
0.0000 mg/h | INTRAMUSCULAR | Status: DC
  Administered 2016-12-13 – 2016-12-14 (×2): 15 mg/h via INTRAVENOUS
  Filled 2016-12-13 (×3): qty 20

## 2016-12-13 MED ORDER — AMIODARONE HCL IN DEXTROSE 360-4.14 MG/200ML-% IV SOLN
60.0000 mg/h | INTRAVENOUS | Status: DC
  Administered 2016-12-13: 60 mg/h via INTRAVENOUS

## 2016-12-13 MED ORDER — METOPROLOL TARTRATE 5 MG/5ML IV SOLN
5.0000 mg | INTRAVENOUS | Status: DC | PRN
  Administered 2016-12-13: 5 mg via INTRAVENOUS
  Filled 2016-12-13: qty 5

## 2016-12-13 MED ORDER — CHLORHEXIDINE GLUCONATE 0.12% ORAL RINSE (MEDLINE KIT)
15.0000 mL | Freq: Two times a day (BID) | OROMUCOSAL | Status: DC
  Administered 2016-12-13 – 2016-12-16 (×7): 15 mL via OROMUCOSAL

## 2016-12-13 MED ORDER — ASPIRIN 300 MG RE SUPP: 300.0000 mg | RECTAL | Status: DC

## 2016-12-13 MED ORDER — MIDAZOLAM HCL 2 MG/2ML IJ SOLN
1.0000 mg | INTRAMUSCULAR | Status: DC | PRN
  Administered 2016-12-13: 1 mg via INTRAVENOUS
  Filled 2016-12-13: qty 2

## 2016-12-13 MED ORDER — DEXTROSE 5 % IV SOLN
2.0000 g | Freq: Once | INTRAVENOUS | Status: AC
  Administered 2016-12-13: 2 g via INTRAVENOUS
  Filled 2016-12-13: qty 2

## 2016-12-13 MED ORDER — ETOMIDATE 2 MG/ML IV SOLN
30.0000 mg | Freq: Once | INTRAVENOUS | Status: AC
  Administered 2016-12-13: 30 mg via INTRAVENOUS

## 2016-12-13 MED ORDER — FENTANYL BOLUS VIA INFUSION
25.0000 ug | INTRAVENOUS | Status: DC | PRN
  Administered 2016-12-13 – 2016-12-15 (×7): 25 ug via INTRAVENOUS
  Filled 2016-12-13: qty 25

## 2016-12-13 MED ORDER — AMIODARONE HCL IN DEXTROSE 360-4.14 MG/200ML-% IV SOLN
INTRAVENOUS | Status: AC
  Filled 2016-12-13: qty 200

## 2016-12-13 MED ORDER — ROCURONIUM BROMIDE 50 MG/5ML IV SOLN
100.0000 mg | Freq: Once | INTRAVENOUS | Status: AC
  Administered 2016-12-13: 100 mg via INTRAVENOUS
  Filled 2016-12-13: qty 10

## 2016-12-13 MED ORDER — ROSUVASTATIN CALCIUM 20 MG PO TABS
20.0000 mg | ORAL_TABLET | Freq: Every day | ORAL | Status: DC
  Administered 2016-12-13 – 2016-12-19 (×7): 20 mg via ORAL
  Filled 2016-12-13 (×7): qty 1

## 2016-12-13 MED ORDER — DIGOXIN 125 MCG PO TABS
0.1250 mg | ORAL_TABLET | Freq: Every day | ORAL | Status: DC
  Administered 2016-12-13: 0.125 mg via ORAL
  Filled 2016-12-13: qty 1

## 2016-12-13 MED ORDER — FUROSEMIDE 10 MG/ML IJ SOLN
120.0000 mg | Freq: Once | INTRAMUSCULAR | Status: AC
  Administered 2016-12-13: 120 mg via INTRAVENOUS
  Filled 2016-12-13: qty 12

## 2016-12-13 MED ORDER — SODIUM CHLORIDE 0.9 % IV SOLN
30.0000 meq | INTRAVENOUS | Status: AC
  Administered 2016-12-13: 30 meq via INTRAVENOUS
  Filled 2016-12-13: qty 15

## 2016-12-13 MED ORDER — INSULIN ASPART 100 UNIT/ML ~~LOC~~ SOLN: 2.0000 [IU] | SUBCUTANEOUS | Status: DC

## 2016-12-13 MED ORDER — ALBUTEROL (5 MG/ML) CONTINUOUS INHALATION SOLN: 10.0000 mg/h | INHALATION_SOLUTION | Freq: Once | RESPIRATORY_TRACT | Status: DC

## 2016-12-13 MED ORDER — ISOPROTERENOL HCL 0.2 MG/ML IJ SOLN
2.0000 ug/min | INTRAVENOUS | Status: DC
  Administered 2016-12-13: 1.5 ug/min via INTRAVENOUS
  Administered 2016-12-13: 1 ug/min via INTRAVENOUS
  Administered 2016-12-14: 1.5 ug/min via INTRAVENOUS
  Filled 2016-12-13 (×3): qty 5

## 2016-12-13 MED ORDER — SODIUM CHLORIDE 0.9 % IV SOLN
250.0000 mL | INTRAVENOUS | Status: DC | PRN
  Administered 2016-12-16: 250 mL via INTRAVENOUS

## 2016-12-13 MED ORDER — INSULIN ASPART 100 UNIT/ML ~~LOC~~ SOLN
0.0000 [IU] | SUBCUTANEOUS | Status: DC
  Administered 2016-12-13: 5 [IU] via SUBCUTANEOUS
  Administered 2016-12-13: 3 [IU] via SUBCUTANEOUS
  Administered 2016-12-13: 11 [IU] via SUBCUTANEOUS
  Administered 2016-12-13: 5 [IU] via SUBCUTANEOUS
  Administered 2016-12-14 (×4): 3 [IU] via SUBCUTANEOUS
  Administered 2016-12-14: 2 [IU] via SUBCUTANEOUS
  Administered 2016-12-14: 3 [IU] via SUBCUTANEOUS
  Administered 2016-12-15 (×2): 2 [IU] via SUBCUTANEOUS
  Administered 2016-12-15 – 2016-12-16 (×3): 3 [IU] via SUBCUTANEOUS
  Administered 2016-12-16: 5 [IU] via SUBCUTANEOUS
  Administered 2016-12-16: 2 [IU] via SUBCUTANEOUS

## 2016-12-13 MED ORDER — PANTOPRAZOLE SODIUM 40 MG PO PACK
40.0000 mg | PACK | Freq: Every day | ORAL | Status: DC
  Administered 2016-12-13 – 2016-12-16 (×4): 40 mg
  Filled 2016-12-13 (×5): qty 20

## 2016-12-13 MED ORDER — ORAL CARE MOUTH RINSE
15.0000 mL | Freq: Four times a day (QID) | OROMUCOSAL | Status: DC
  Administered 2016-12-13 – 2016-12-16 (×12): 15 mL via OROMUCOSAL

## 2016-12-13 MED ORDER — FENTANYL CITRATE (PF) 100 MCG/2ML IJ SOLN: 50.0000 ug | Freq: Once | INTRAMUSCULAR | Status: DC

## 2016-12-13 MED ORDER — SODIUM CHLORIDE 0.9 % IV SOLN
0.0000 mg/h | INTRAVENOUS | Status: DC
  Administered 2016-12-13 (×2): 14 mg/h via INTRAVENOUS
  Administered 2016-12-13: 5 mg/h via INTRAVENOUS
  Filled 2016-12-13 (×3): qty 10

## 2016-12-13 MED ORDER — POTASSIUM PHOSPHATES 15 MMOLE/5ML IV SOLN
30.0000 mmol | Freq: Once | INTRAVENOUS | Status: AC
  Administered 2016-12-13: 30 mmol via INTRAVENOUS
  Filled 2016-12-13: qty 10

## 2016-12-13 MED ORDER — MIDAZOLAM HCL 2 MG/2ML IJ SOLN
INTRAMUSCULAR | Status: AC
  Administered 2016-12-13: 2 mg
  Filled 2016-12-13: qty 4

## 2016-12-13 MED ORDER — NALOXONE HCL 0.4 MG/ML IJ SOLN
0.4000 mg | Freq: Once | INTRAMUSCULAR | Status: AC
  Administered 2016-12-13: 0.4 mg via INTRAVENOUS
  Filled 2016-12-13: qty 1

## 2016-12-13 MED ORDER — RIVAROXABAN 20 MG PO TABS
20.0000 mg | ORAL_TABLET | Freq: Every day | ORAL | Status: DC
  Administered 2016-12-13 – 2016-12-18 (×6): 20 mg via ORAL
  Filled 2016-12-13 (×6): qty 1

## 2016-12-13 MED ORDER — MIDAZOLAM HCL 2 MG/2ML IJ SOLN
INTRAMUSCULAR | Status: AC
  Administered 2016-12-13: 2 mg
  Filled 2016-12-13: qty 2

## 2016-12-13 MED ORDER — SODIUM CHLORIDE 0.9 % IV SOLN
0.5000 mg/h | INTRAVENOUS | Status: DC
  Administered 2016-12-13: 0.5 mg/h via INTRAVENOUS
  Filled 2016-12-13: qty 10

## 2016-12-13 MED ORDER — POTASSIUM CHLORIDE 20 MEQ/15ML (10%) PO SOLN
40.0000 meq | ORAL | Status: AC
Start: 2016-12-13 — End: 2016-12-13
  Administered 2016-12-13 (×2): 40 meq via ORAL
  Filled 2016-12-13: qty 30

## 2016-12-13 MED ORDER — MIDAZOLAM HCL 2 MG/2ML IJ SOLN: 1.0000 mg | INTRAMUSCULAR | Status: DC | PRN

## 2016-12-13 MED ORDER — VANCOMYCIN HCL 10 G IV SOLR
2500.0000 mg | Freq: Once | INTRAVENOUS | Status: AC
  Administered 2016-12-13: 2500 mg via INTRAVENOUS
  Filled 2016-12-13: qty 2500

## 2016-12-13 MED ORDER — ENOXAPARIN SODIUM 40 MG/0.4ML ~~LOC~~ SOLN: 40.0000 mg | SUBCUTANEOUS | Status: DC

## 2016-12-13 MED ORDER — MAGNESIUM SULFATE 2 GM/50ML IV SOLN
INTRAVENOUS | Status: AC
  Administered 2016-12-13: 12:00:00
  Filled 2016-12-13: qty 50

## 2016-12-13 MED ORDER — ASPIRIN 81 MG PO CHEW: 324.0000 mg | CHEWABLE_TABLET | ORAL | Status: DC

## 2016-12-13 MED ORDER — SODIUM CHLORIDE 0.9 % IV SOLN
25.0000 ug/h | INTRAVENOUS | Status: DC
  Administered 2016-12-13: 25 ug/h via INTRAVENOUS
  Filled 2016-12-13: qty 50

## 2016-12-13 MED ORDER — LIDOCAINE IN D5W 4-5 MG/ML-% IV SOLN
1.0000 mg/min | INTRAVENOUS | Status: DC
  Administered 2016-12-13: 1 mg/min via INTRAVENOUS
  Administered 2016-12-14: 3 mg/min via INTRAVENOUS
  Administered 2016-12-14: 1 mg/min via INTRAVENOUS
  Filled 2016-12-13 (×3): qty 500

## 2016-12-13 MED FILL — Medication: Qty: 1 | Status: AC

## 2016-12-13 NOTE — ED Notes (Signed)
Insert temp foley cath.'14.

## 2016-12-13 NOTE — H&P (Signed)
PULMONARY / CRITICAL CARE MEDICINE   Name: Nicholas Caldwell MRN: [id number] DOB: 17-Oct-1946    ADMISSION DATE:  12/13/2016 CONSULTATION DATE:    REFERRING MD:  EDP  CHIEF COMPLAINT:  Shortness of breath  HISTORY OF PRESENT ILLNESS:   Mr. Nicholas Caldwell is a 25M with PMH significant for CAD, systolic and diastolic CHF (EF 32-95%), DMII, HLD, HTN, paroxysmal a fib/flutter, pHTN, COPD and tobacco abuse who presents from home via EMS with acute shortness of breath and flu like symptoms for the past few days. On arrival EMS noted sats in the 60s on RA, gave him bronchodilators, 163m solumedrol, 2g mag and put him on CPAP. Despite this he was markedly somnolent on arrival to the ED and was urgently intubated after ABG on BiPAP resulted with pH 7.151, pCO2 109, pO2 87, HCO3 36.5.  Per the notes he had also complained of chest pain, leg swelling and productive cough x 3 days.  Labs demonstrate normal WBC, diff pending, elevated lactate @ 2.49, renal fxn okay with Cr 0.7 / BUN 5. CXR with bilateral infiltrates c/w pulmonary edema, enlarged cardiac silhouette, ETT 5.1 above carina and probable L effusion.  Wife provides additional history that he has had increasing lower extremity edema, abdominal girth and shortness of breath over the last 3-5 days. He had subjective fever and cough productive of whitish-yellow phlegm. He was using 3L of O2 near-continuously. He had a few episodes of loose stool and urgency where he didn't make it to the bathroom in time. No chest pain. No palpitations. He was not using his inhalers more. He did try some OTC cough remedies.   PAST MEDICAL HISTORY :  He  has a past medical history of Atrial flutter ([place]; CAD (coronary artery disease); Chronic pain; Chronic systolic CHF (congestive heart failure) (HCC); COPD (chronic obstructive pulmonary disease) ([place]; Diabetes mellitus without complication ([place]; Hypercholesteremia; Hypertension; NICM (nonischemic cardiomyopathy) ([place]; PAF  (paroxysmal atrial fibrillation) ([place]; Prolonged Q-T interval on ECG; Pulmonary hypertension; and Tobacco abuse.  PAST SURGICAL HISTORY: He  has a past surgical history that includes left and right heart catheterization with coronary angiogram (N/A, 11/06/2014) and Splenectomy.  No Known Allergies  No current facility-administered medications on file prior to encounter.    Current Outpatient Prescriptions on File Prior to Encounter  Medication Sig  . albuterol (PROVENTIL HFA;VENTOLIN HFA) 108 (90 BASE) MCG/ACT inhaler Inhale 2 puffs into the lungs every 6 (six) hours as needed for wheezing or shortness of breath.  .Marland Kitchenamiodarone (PACERONE) 400 MG tablet Take 0.5 tablets (200 mg total) by mouth daily.  . carvedilol (COREG) 12.5 MG tablet Take 1 tablet (12.5 mg total) by mouth 2 (two) times daily with a meal.  . diazepam (VALIUM) 10 MG tablet Take 0.5 tablets (5 mg total) by mouth daily as needed for anxiety or sleep.  .Marland Kitchendiclofenac sodium (VOLTAREN) 1 % GEL Apply 2 g topically 2 (two) times daily as needed (for pain).  .Marland Kitchendigoxin (LANOXIN) 0.125 MG tablet Take 1 tablet (0.125 mg total) by mouth daily.  . fluticasone (FLONASE) 50 MCG/ACT nasal spray Place 2 sprays into both nostrils daily as needed for allergies.   .Marland Kitchengabapentin (NEURONTIN) 600 MG tablet Take 1-3 tablets (600-1,800 mg total) by mouth See admin instructions. Take 1 tablet (600 mg) by mouth daily with lunch,3 tablets (1800 mg) at bedtime  . gabapentin (NEURONTIN) 800 MG tablet Take 800 mg by mouth as directed.  .Marland KitchenglipiZIDE (GLUCOTROL) 10 MG tablet Take 10 mg by mouth  2 (two) times daily before a meal.   . glucose 4 GM chewable tablet Chew 4 tablets by mouth See admin instructions. Chew 4 tablets by mouth as needed for low blood sugar - repeat every 15 minutes if blood sugar less than 70  . hydrocortisone (ANUSOL-HC) 2.5 % rectal cream Place 1 application rectally 2 (two) times daily as needed for hemorrhoids or itching. Use up to 2 weeks  at a time as needed  . hydrocortisone 1 % lotion Apply 1 application topically See admin instructions. Apply small amount to back twice daily for itchy rash  . insulin detemir (LEVEMIR) 100 UNIT/ML injection Inject 0.2 mLs (20 Units total) into the skin every evening.  Marland Kitchen ipratropium (ATROVENT) 0.02 % nebulizer solution Take 2.5 mLs (0.5 mg total) by nebulization every 6 (six) hours as needed for wheezing or shortness of breath.  . magnesium oxide (MAG-OX) 400 (241.3 Mg) MG tablet Take 1 tablet (400 mg total) by mouth 2 (two) times daily.  . metFORMIN (GLUCOPHAGE-XR) 500 MG 24 hr tablet Take 1,000 mg by mouth 2 (two) times daily with a meal.  . methadone (DOLOPHINE) 10 MG tablet Take 1 tablet (10 mg total) by mouth every 12 (twelve) hours.  . mometasone-formoterol (DULERA) 200-5 MCG/ACT AERO Inhale 2 puffs into the lungs 2 (two) times daily.  . potassium chloride SA (K-DUR,KLOR-CON) 20 MEQ tablet Take 2 tablets (40 mEq total) by mouth daily.  Marland Kitchen PRESCRIPTION MEDICATION Pt states he has been taking a antibiotic for the last few days, pt states it was for his cold, [facility] where [facility] said he had it filled and they haven't filled it there, pt said he doesn't care if we clarify it or not, wont be bringing in medication  . rivaroxaban (XARELTO) 20 MG TABS tablet Take 1 tablet (20 mg total) by mouth daily with supper.  . rosuvastatin (CRESTOR) 40 MG tablet Take 20 mg by mouth daily. For cholesterol  . sacubitril-valsartan (ENTRESTO) 49-51 MG Take 1 tablet by mouth 2 (two) times daily.  Marland Kitchen spironolactone (ALDACTONE) 25 MG tablet Take 1 tablet (25 mg total) by mouth daily.  Marland Kitchen torsemide (DEMADEX) 20 MG tablet Take 4 tablets (80 mg total) by mouth daily.    FAMILY HISTORY:  His indicated that his mother is deceased. He indicated that his father is deceased. He indicated that the status of his sister is unknown. He indicated that the status of his neg hx is unknown.    SOCIAL HISTORY: He  reports  that he has been smoking Cigarettes.  He has been smoking about 0.05 packs per day. He has never used smokeless tobacco. He reports that he does not drink alcohol or use drugs.  REVIEW OF SYSTEMS:   Unable to obtain 2/2 intubated state  SUBJECTIVE:    VITAL SIGNS: BP (!) 156/125 (BP Location: Left Arm)   Pulse (!) 169   Temp 97.4 F (36.3 C) (Axillary)   Resp 25   Ht 5' 11.5" (1.816 m)   Wt 129 kg (284 lb 6.3 oz)   SpO2 100%   BMI 39.11 kg/m   HEMODYNAMICS:    VENTILATOR SETTINGS: Vent Mode: PRVC FiO2 (%):  [60 %] 60 % Set Rate:  [24 bmp] 24 bmp Vt Set:  [610 mL] 610 mL PEEP:  [5 cmH20] 5 cmH20 Plateau Pressure:  [33 cmH20] 33 cmH20  INTAKE / OUTPUT: No intake/output data recorded.  PHYSICAL EXAMINATION:  General Well nourished, well developed, obese, intubated, sedated  HEENT  No gross abnormalities. OETT / OGT in place   Pulmonary Coarse breath sounds bilaterally, improved with suctioning, occasional wheeze on anterior exam. Vent-assisted effort, symmetrical expansion.   Cardiovascular Irregularly irregular rhythm, tachy 130s-140s. S1, s2. No m/r/g. Distal pulses palpable.  Abdomen Soft, non-tender, obese, positive bowel sounds, no palpable organomegaly or masses. Normoresonant to percussion.  Musculoskeletal Grossly normal  Lymphatics No cervical, supraclavicular or axillary adenopathy.   Neurologic Exam limited by sedation, recent paralytic  Skin/Integuement No rash, no cyanosis, no clubbing, 2+ edema bilateral lower extremities    LABS:  BMET  Recent Labs Lab 12/13/16 0253 12/13/16 0309  NA 142 144  K 3.6 3.5  CL 97* 100*  CO2 34*  --   BUN <5* 5*  CREATININE 0.76 0.70  GLUCOSE 318* 315*    Electrolytes  Recent Labs Lab 12/13/16 0253  CALCIUM 8.3*    CBC  Recent Labs Lab 12/13/16 0253 12/13/16 0309  WBC 8.0  --   HGB 12.5* 15.3  HCT 42.9 45.0  PLT 362  --     Coag's No results for input(s): APTT, INR in the last 168  hours.  Sepsis Markers  Recent Labs Lab 12/13/16 0308 12/13/16 0338  LATICACIDVEN 2.49* 2.8*    ABG  Recent Labs Lab 12/13/16 0304  PHART 7.151*  PCO2ART 109*  PO2ART 87.0    Liver Enzymes  Recent Labs Lab 12/13/16 0253  AST 37  ALT 17  ALKPHOS 124  BILITOT 0.6  ALBUMIN 2.9*    Cardiac Enzymes  Recent Labs Lab 12/13/16 0253  TROPONINI 0.03*    Glucose  Recent Labs Lab 12/13/16 0306  GLUCAP 342*    Imaging Dg Chest Portable 1 View  Result Date: 12/13/2016 CLINICAL DATA:  Acute onset shortness of breath. Flu-like symptoms for a few days. Low oxygen saturation. EXAM: PORTABLE CHEST 1 VIEW COMPARISON:  10/31/2016 FINDINGS: Cardiac enlargement with pulmonary vascular congestion. Diffuse bilateral airspace infiltration in the lungs, progressing since prior study. This consistent with edema. Small bilateral pleural effusions. No pneumothorax. Bilateral cervical ribs. IMPRESSION: Progressing congestive changes in the heart and lungs with cardiac enlargement, pulmonary vascular congestion, bilateral airspace edema, and small pleural effusions. Electronically Signed   By: Lucienne Capers M.D.   On: 12/13/2016 03:14     STUDIES:  CXR as above  CULTURES: Flu swab pending Blood cx pending  ANTIBIOTICS: Vancomycin 2/10 >> Cefepime 2/10 >>  SIGNIFICANT EVENTS: Intubation in ED  LINES/TUBES: OETT 2/10 >> OGT 2/10 >> Foley 2/10 >>  DISCUSSION: Mr. Markgraf is a 65M with PMH as above presenting with acute on chronic hypoxemic / hypercapneic respiratory failure in the setting of known systolic/diastolic dysfunction and COPD. Some concern for acute viral syndrome with symptom onset > 48h ago. Currently in a fib with RVR, but maintaining adequate BP.  ASSESSMENT / PLAN:  PULMONARY A: Acute on chronic hypoxemic/hypercapneic respiratory failure 2/2 acute decompensated mixed CHF +/- viral syndrome Underlying COPD (no PFTs on file), without overt acute  exacerbation Pulmonary HTN (RVSP 57 on last TTE) P:   Continue MV Wean as tolerated DuoNebs q6h  Albuterol q4h prn No steroids for now Diurese Check Flu Tracheal aspirate  CARDIOVASCULAR A:  A fib/flutter with RVR - on xarelto, dig, amio at home Acute decompensated CHF (EF 35-40%) P:  Diurese - lasix 158m given in ED Continue digoxin Prn metoprolol iv for HR > 120; hold for SBP < 110  RENAL A:   No acute issues - Cr 0.7 P:  Monitor renal function  GASTROINTESTINAL A:   Reported diarrhea P:   NPO Stress ulcer ppx Monitor bowel fxn  HEMATOLOGIC A:   Mild anemia 12.5 Chronic a/c for a fib/flutter P:  Trend CBC Continue home xarelto  INFECTIOUS A:   Possible viral syndrome; doubt bacterial infxn P:   Follow cultures, flu panel Check PCT De-escalate abx as able  ENDOCRINE A:   IDDM   P:   CBG  SSI  NEUROLOGIC A:   No acute issues, but exam limited by sedation P:   RASS goal: 0 to -1 Wean sedation; stop versed gtt, change to prn Fentanyl gtt for analgesia   FAMILY  - Updates: Wife at bedside. Per our discussion and her prior discussion with the patient, he would desire to be FULL CODE.  - Inter-disciplinary family meet or Palliative Care meeting due by:  day 7  The patient is critically ill with multiple organ system failure and requires high complexity decision making for assessment and support, frequent evaluation and titration of therapies, advanced monitoring, review of radiographic studies and interpretation of complex data.   Critical Care Time devoted to patient care services, exclusive of separately billable procedures, described in this note is 52 minutes.   Yisroel Ramming, MD [facility] [place] Pager: [phone]  12/13/2016, 3:42 AM

## 2016-12-13 NOTE — Progress Notes (Signed)
Critical ABG values given to MD Regenia Skeeter.

## 2016-12-13 NOTE — Progress Notes (Signed)
  Echocardiogram 2D Echocardiogram has been performed.  Nicholas Caldwell 12/13/2016, 6:06 PM

## 2016-12-13 NOTE — Consult Note (Signed)
ELECTROPHYSIOLOGY CONSULT NOTE  Patient ID: Mccabe Gloria, MRN: [id number], DOB/AGE: Oct 28, 1946 71 y.o. Admit date: 12/13/2016 Date of Consult: 12/13/2016  Primary Physician: [facility] Primary Cardiologist:SW  Chief Complaint: tORS   HPI Ariyan Brisendine is a 71 y.o. male  Admitted with acute respiratory failure  Underwent agessvie diruesis and has developed QT prolongation and TdP requiring shocks  He is on chronic amio for atrial arrhythmias although held in the past for QT prolongation ;  ECG 1/18 without QT prolongation; Other QT prolonging drugs incl methadone and diuretics  EF  35% 2018 with severe LAE; CAth 1/16 no CAD  Has declined ICD in the past  COPD on home 02; noncompliant with CAPP   Past Medical History:  Diagnosis Date  . Atrial flutter ([place])    a. recurrent AFlutter with RVR;  b. Amiodarone Rx started 4/16  . CAD (coronary artery disease)    a. LHC 1/16:  mLAD diffuse disease, pLCx mild disease, dLCx with disease but too small for PCI, RCA ok, EF 25-30%  . Chronic pain   . Chronic systolic CHF (congestive heart failure) ([place])   . COPD (chronic obstructive pulmonary disease) ([place])   . Diabetes mellitus without complication ([place])   . Hypercholesteremia   . Hypertension   . NICM (nonischemic cardiomyopathy) ([place])    a.dx 2016. b. 2D echo 06/2016 - Last echo 07/01/16: mod dilated LV, mod LVH, EF 25-30%, mild-mod MR, sev LAE, mild-mod reduced RV systolic function, mild-mod TR, PASP 96mHG.  .Marland KitchenPAF (paroxysmal atrial fibrillation) (HCC)    On amio - ot a candidate for flecainide due to cardiomyopathy, not a candidate for Tikosyn due to prolonged QT, and felt to be a poor candidate for ablation given left atrial size.  . Prolonged Q-T interval on ECG   . Pulmonary hypertension   . Tobacco abuse       Surgical History:  Past Surgical History:  Procedure Laterality Date  . LEFT AND RIGHT HEART CATHETERIZATION WITH CORONARY ANGIOGRAM N/A 11/06/2014   Procedure: LEFT AND RIGHT HEART CATHETERIZATION WITH CORONARY ANGIOGRAM;  Surgeon: JJettie Booze MD;  [facility];  Service: Cardiovascular;  Laterality: N/A;  . SPLENECTOMY       Home Meds: Prior to Admission medications   Medication Sig Start Date End Date Taking? Authorizing Provider  albuterol (PROVENTIL HFA;VENTOLIN HFA) 108 (90 BASE) MCG/ACT inhaler Inhale 2 puffs into the lungs every 6 (six) hours as needed for wheezing or shortness of breath. 11/07/14   UKinnie Feil MD  amiodarone (PACERONE) 400 MG tablet Take 0.5 tablets (200 mg total) by mouth daily. 11/24/16   JJettie Booze MD  carvedilol (COREG) 12.5 MG tablet Take 1 tablet (12.5 mg total) by mouth 2 (two) times daily with a meal. 11/05/16   Belkys A Regalado, MD  diazepam (VALIUM) 10 MG tablet Take 0.5 tablets (5 mg total) by mouth daily as needed for anxiety or sleep. 07/04/16   Belkys A Regalado, MD  diclofenac sodium (VOLTAREN) 1 % GEL Apply 2 g topically 2 (two) times daily as needed (for pain).    Historical Provider, MD  digoxin (LANOXIN) 0.125 MG tablet Take 1 tablet (0.125 mg total) by mouth daily. 09/12/15   SLiliane Shi PA-C  fluticasone (FLONASE) 50 MCG/ACT nasal spray Place 2 sprays into both nostrils daily as needed for allergies.     Historical Provider, MD  gabapentin (NEURONTIN) 600 MG tablet Take 1-3 tablets (600-1,800 mg total) by  mouth See admin instructions. Take 1 tablet (600 mg) by mouth daily with lunch,3 tablets (1800 mg) at bedtime 07/04/16   Belkys A Regalado, MD  gabapentin (NEURONTIN) 800 MG tablet Take 800 mg by mouth as directed. 10/01/16   Historical Provider, MD  glipiZIDE (GLUCOTROL) 10 MG tablet Take 10 mg by mouth 2 (two) times daily before a meal.     Historical Provider, MD  glucose 4 GM chewable tablet Chew 4 tablets by mouth See admin instructions. Chew 4 tablets by mouth as needed for low blood sugar - repeat every 15 minutes if blood sugar less than 70    Historical  Provider, MD  hydrocortisone (ANUSOL-HC) 2.5 % rectal cream Place 1 application rectally 2 (two) times daily as needed for hemorrhoids or itching. Use up to 2 weeks at a time as needed    Historical Provider, MD  hydrocortisone 1 % lotion Apply 1 application topically See admin instructions. Apply small amount to back twice daily for itchy rash    Historical Provider, MD  insulin detemir (LEVEMIR) 100 UNIT/ML injection Inject 0.2 mLs (20 Units total) into the skin every evening. 11/05/16   Belkys A Regalado, MD  ipratropium (ATROVENT) 0.02 % nebulizer solution Take 2.5 mLs (0.5 mg total) by nebulization every 6 (six) hours as needed for wheezing or shortness of breath. 08/04/16   Hosie Poisson, MD  magnesium oxide (MAG-OX) 400 (241.3 Mg) MG tablet Take 1 tablet (400 mg total) by mouth 2 (two) times daily. 11/05/16   Belkys A Regalado, MD  metFORMIN (GLUCOPHAGE-XR) 500 MG 24 hr tablet Take 1,000 mg by mouth 2 (two) times daily with a meal.    Historical Provider, MD  methadone (DOLOPHINE) 10 MG tablet Take 1 tablet (10 mg total) by mouth every 12 (twelve) hours. 08/04/16   Hosie Poisson, MD  mometasone-formoterol (DULERA) 200-5 MCG/ACT AERO Inhale 2 puffs into the lungs 2 (two) times daily.    Historical Provider, MD  potassium chloride SA (K-DUR,KLOR-CON) 20 MEQ tablet Take 2 tablets (40 mEq total) by mouth daily. 11/07/14   Kinnie Feil, MD  PRESCRIPTION MEDICATION Pt states he has been taking a antibiotic for the last few days, pt states it was for his cold, [facility] it [facility] and they haven't filled it there, pt said he doesn't care if we clarify it or not, wont be bringing in medication    Historical Provider, MD  rivaroxaban (XARELTO) 20 MG TABS tablet Take 1 tablet (20 mg total) by mouth daily with supper. 02/23/15   Luan Moore, MD  rosuvastatin (CRESTOR) 40 MG tablet Take 20 mg by mouth daily. For cholesterol    Historical Provider, MD  sacubitril-valsartan (ENTRESTO)  49-51 MG Take 1 tablet by mouth 2 (two) times daily. 09/12/15   Liliane Shi, PA-C  spironolactone (ALDACTONE) 25 MG tablet Take 1 tablet (25 mg total) by mouth daily. 08/02/15   Liliane Shi, PA-C  torsemide (DEMADEX) 20 MG tablet Take 4 tablets (80 mg total) by mouth daily. 11/06/16   Belkys A Regalado, MD    Inpatient Medications:  . chlorhexidine gluconate (MEDLINE KIT)  15 mL Mouth Rinse BID  . digoxin  0.125 mg Oral Daily  . fentaNYL (SUBLIMAZE) injection  50 mcg Intravenous Once  . insulin aspart  0-15 Units Subcutaneous Q4H  . mouth rinse  15 mL Mouth Rinse QID  . pantoprazole sodium  40 mg Per Tube Daily  . potassium chloride (KCL  MULTIRUN) 30 mEq in 265 mL IVPB  30 mEq Intravenous STAT  . rivaroxaban  20 mg Oral Q supper  . rosuvastatin  20 mg Oral Daily      Allergies: No Known Allergies  Social History   Social History  . Marital status: Single    Spouse name: N/A  . Number of children: N/A  . Years of education: N/A   Occupational History  . Not on file.   Social History Main Topics  . Smoking status: Current Every Day Smoker    Packs/day: 0.05    Types: Cigarettes    Last attempt to quit: 08/30/2015  . Smokeless tobacco: Never Used  . Alcohol use No  . Drug use: No  . Sexual activity: Not on file   Other Topics Concern  . Not on file   Social History Narrative  . No narrative on file     Family History  Problem Relation Age of Onset  . Heart disease Mother   . Hypertension Mother   . Heart failure Mother   . Heart disease Father   . Hypertension Sister   . Heart attack Neg Hx   . Stroke Neg Hx      ROS:  Please see the history of present illness.     All other systems reviewed and negative.    Physical Exam:  Blood pressure 136/72, pulse 74, temperature 99.1 F (37.3 C), temperature source Oral, resp. rate (!) 24, height 6' (1.829 m), weight 274 lb 4 oz (124.4 kg), SpO2 99 %. General: Well developed, well nourished male intubated awake and  then sedated  Head: Normocephalic, atraumatic, sclera non-icteric, no xanthomas, nares are without discharge. EENT: normal Lymph Nodes:  none Back: without scoliosis/kyphosis , no CVA tendersness Neck: Negative for carotid bruits. JVD not appreciated  Lungs: Clear anteriorly to auscultation without wheezes, rales, or rhonchi. Breathing is unlabored. Heart: RRR with S1 S2.  2 /6 systolic murmur , rubs, or gallops appreciated. Abdomen: Soft, non-tender, non-distended with normoactive bowel sounds. No hepatomegaly. No rebound/guarding. No obvious abdominal masses. Msk:  Strength and tone appear normal for age. Extremities: No clubbing or cyanosis. 3  + edema.  Distal pedal pulses are 2+ and equal bilaterally. Skin: Warm and Dry Neuro: Alert and oriented X 3. CN III-XII intact Grossly normal sensory and motor function . Psych:  Responds to questions appropriately with a normal affect.      Labs: Cardiac Enzymes  Recent Labs  12/13/16 0253  TROPONINI 0.03*   CBC Lab Results  Component Value Date   WBC 8.0 12/13/2016   HGB 15.3 12/13/2016   HCT 45.0 12/13/2016   MCV 89.4 12/13/2016   PLT 362 12/13/2016   PROTIME: No results for input(s): LABPROT, INR in the last 72 hours. Chemistry  Recent Labs Lab 12/13/16 0253  12/13/16 0441  NA 142  < > 140  K 3.6  < > 3.7  CL 97*  < > 96*  CO2 34*  --  31  BUN <5*  < > 6  CREATININE 0.76  < > 0.74  CALCIUM 8.3*  --  8.6*  PROT 7.4  --   --   BILITOT 0.6  --   --   ALKPHOS 124  --   --   ALT 17  --   --   AST 37  --   --   GLUCOSE 318*  < > 357*  < > = values in this interval not displayed. Lipids  Lab Results  Component Value Date   CHOL 124 11/01/2016   HDL 38 (L) 11/01/2016   LDLCALC 67 11/01/2016   TRIG 95 11/01/2016   BNP Pro B Natriuretic peptide (BNP)  Date/Time Value Ref Range Status  08/02/2015 05:14 PM 363.0 (H) 0.0 - 100.0 pg/mL Final  04/16/2015 04:52 PM 308.0 (H) 0.0 - 100.0 pg/mL Final   Thyroid Function  Tests: No results for input(s): TSH, T4TOTAL, T3FREE, THYROIDAB in the last 72 hours.  Invalid input(s): FREET3    Miscellaneous Lab Results  Component Value Date   DDIMER 0.42 10/31/2014    Radiology/Studies:  Dg Chest Portable 1 View  Result Date: 12/13/2016 CLINICAL DATA:  71 y/o  M; tube change. EXAM: PORTABLE CHEST 1 VIEW COMPARISON:  12/13/2016 chest radiograph FINDINGS: Lung bases and diaphragm are excluded from the field of view. Endotracheal tube is 5.3 cm from the carina. Cardiomegaly. Diffuse pulmonary edema. Enteric tube tip extends below the field of view. IMPRESSION: Lung bases and diaphragm are excluded from the field of view. Endotracheal tube is 5.3 cm from the carina. Cardiomegaly. Diffuse pulmonary edema. Enteric tube tip extends below the field of view. Electronically Signed   By: Kristine Garbe M.D.   On: 12/13/2016 05:27   Dg Chest Portable 1 View  Result Date: 12/13/2016 CLINICAL DATA:  Intubation EXAM: PORTABLE CHEST 1 VIEW COMPARISON:  12/13/2016 FINDINGS: Endotracheal tube tip is approximately 5.1 cm superior to the carina. Continued cardiomegaly with central vascular congestion can moderate diffuse pulmonary edema. Consolidation at the left lung base. Cannot exclude small left pleural effusion. No pneumothorax. Non inclusion of the right CP angle. IMPRESSION: Endotracheal tube tip is approximately 5.1 cm superior to the carina. Continued cardiomegaly with central vascular congestion and moderate diffuse pulmonary edema. Probable small left pleural effusion. Electronically Signed   By: Donavan Foil M.D.   On: 12/13/2016 03:41   Dg Chest Portable 1 View  Result Date: 12/13/2016 CLINICAL DATA:  Acute onset shortness of breath. Flu-like symptoms for a few days. Low oxygen saturation. EXAM: PORTABLE CHEST 1 VIEW COMPARISON:  10/31/2016 FINDINGS: Cardiac enlargement with pulmonary vascular congestion. Diffuse bilateral airspace infiltration in the lungs,  progressing since prior study. This consistent with edema. Small bilateral pleural effusions. No pneumothorax. Bilateral cervical ribs. IMPRESSION: Progressing congestive changes in the heart and lungs with cardiac enlargement, pulmonary vascular congestion, bilateral airspace edema, and small pleural effusions. Electronically Signed   By: Lucienne Capers M.D.   On: 12/13/2016 03:14    EKG: NSR with 13/10/60 Tel Torsades d pointes   Assessment and Plan: * Torasdes de pointes  Ischemic cardiomyopathy  CHF  Acute/chronic systolic  Intubated  TdP likely 2/2 hypokalemia related to aggressive diuresis  Have Rx with K, Mg and Lido for now to shorten the QT interval Will hold  Amio for now but it is almost never the issue  Ischemia can further aggravate QT prolongation but no need to invoke that yet   Sedate until rhtym quiet    Virl Axe

## 2016-12-13 NOTE — Progress Notes (Signed)
Pt had a blown ETT cuff, cause unknown. No seal around the ETT and pt was losing expiratory volumes on the vent. MD aware. Pt was bougie with tube exchanger per MD/RRT. Unsuccessful Exchange. Pt was re-intubated per MD Regenia Skeeter.  Pt is stable at this time.

## 2016-12-13 NOTE — Progress Notes (Signed)
Mykael Batz (pt's wife) [phone] or [phone]

## 2016-12-13 NOTE — Procedures (Signed)
Code Blue Note  Code called with patient becoming unresponsive with torsades.  Patient was shocked x4 and amiodarone given.  NSR established with frequent PVCs.  Rush Farmer, M.D. [facility] Pulmonary/Critical Care Medicine. Pager: [phone]. After hours pager: [phone].

## 2016-12-13 NOTE — ED Provider Notes (Signed)
[facility] DEPT Provider Note   CSN: [id number] Arrival date & time: 12/13/16  0244 By signing my name below, I, Dyke Brackett, attest that this documentation has been prepared under the direction and in the presence of Sherwood Gambler, MD . Electronically Signed: Dyke Brackett, Scribe. 12/13/2016. 3:14 AM.   History   Chief Complaint Chief Complaint  Patient presents with  . Respiratory Distress   LEVEL 5 CAVEAT DUE TO RESPIRATORY DISTRESS   HPI Nicholas Caldwell is a 71 y.o. male with a h/o CAD, CHF, COPD, DM, and OSA who presents to the Emergency Department complaining of persistent, severe respiratory distress onset 4 hours ago. Pt has taken home medication PTA with no relief. EMS placed patient of CPAP, given 10 mg Albuterol/0.5 mg atrovent, 125 mg Solumedrol and 2 grams magnesium with some reported relief. Pt reports associated leg swelling and CP. Per EMS, pt has had flu like symptoms for a few days and reports a productive cough x 3 days.   The history is provided by the EMS personnel. The history is limited by the condition of the patient.   Past Medical History:  Diagnosis Date  . Atrial flutter ([place])    a. recurrent AFlutter with RVR;  b. Amiodarone Rx started 4/16  . CAD (coronary artery disease)    a. LHC 1/16:  mLAD diffuse disease, pLCx mild disease, dLCx with disease but too small for PCI, RCA ok, EF 25-30%  . Chronic pain   . Chronic systolic CHF (congestive heart failure) ([place])   . COPD (chronic obstructive pulmonary disease) ([place])   . Diabetes mellitus without complication ([place])   . Hypercholesteremia   . Hypertension   . NICM (nonischemic cardiomyopathy) ([place])    a.dx 2016. b. 2D echo 06/2016 - Last echo 07/01/16: mod dilated LV, mod LVH, EF 25-30%, mild-mod MR, sev LAE, mild-mod reduced RV systolic function, mild-mod TR, PASP 82mHG.  .Marland KitchenPAF (paroxysmal atrial fibrillation) (HCC)    On amio - ot a candidate for flecainide due to cardiomyopathy, not a candidate for  Tikosyn due to prolonged QT, and felt to be a poor candidate for ablation given left atrial size.  . Prolonged Q-T interval on ECG   . Pulmonary hypertension   . Tobacco abuse    Patient Active Problem List   Diagnosis Date Noted  . Acute on chronic congestive heart failure ([place]   . Acute respiratory failure with hypercapnia ([facility]   . Chronic obstructive pulmonary disease ([facility]   . Medically noncompliant   . Panlobular emphysema ([place]   . OSA (obstructive sleep apnea)   . Paroxysmal atrial fibrillation (HCC)   . Pulmonary hypertension   . Disorientation   . Acute respiratory failure ([place] 10/30/2016  . Pressure injury of skin 09/07/2016  . Acute on chronic respiratory failure with hypoxia ([place] 09/05/2016  . Acute on chronic systolic heart failure ([place] 09/05/2016  . CHF exacerbation ([place] 09/05/2016  . Skin lesion-left heal 09/05/2016  . Acute exacerbation of CHF (congestive heart failure) ([place] 07/28/2016  . Chronic systolic CHF (congestive heart failure) ([place] 07/16/2016  . Paroxysmal atrial flutter ([facility] 07/16/2016  . COPD (chronic obstructive pulmonary disease) ([place] 07/16/2016  . Uncontrolled type 2 diabetes mellitus with complication ([facility]   . Diabetic polyneuropathy associated with diabetes mellitus due to underlying condition ([facility]   . Normocytic anemia 06/29/2016  . CAD - Non-obstructive by LHC1/16 01/21/2016  . Prolonged QT interval 10/09/2015  . Nonischemic cardiomyopathy ([place] 10/09/2015  . PAF (paroxysmal atrial fibrillation) (  [place])   . Acute respiratory failure with hypoxia and hypercarbia ([place]) 02/19/2015  . Chronic pain 02/19/2015  . Morbid obesity ([place]) 02/13/2015  . Acute on chronic systolic CHF 41/96/2229  . Elevated troponin I level 11/01/2014  . COPD exacerbation ([place]) 11/01/2014  . Essential hypertension 10/31/2014  . Type 2 diabetes mellitus with neuropathy 10/31/2014  . Hyperlipidemia  10/31/2014  . Smoker 10/31/2014    Past Surgical History:    Procedure Laterality Date  . LEFT AND RIGHT HEART CATHETERIZATION WITH CORONARY ANGIOGRAM N/A 11/06/2014   Procedure: LEFT AND RIGHT HEART CATHETERIZATION WITH CORONARY ANGIOGRAM;  Surgeon: Jettie Booze, MD;  Location: [facility] CATH LAB;  Service: Cardiovascular;  Laterality: N/A;  . SPLENECTOMY      Home Medications    Prior to Admission medications   Medication Sig Start Date End Date Taking? Authorizing Provider  albuterol (PROVENTIL HFA;VENTOLIN HFA) 108 (90 BASE) MCG/ACT inhaler Inhale 2 puffs into the lungs every 6 (six) hours as needed for wheezing or shortness of breath. 11/07/14   Kinnie Feil, MD  amiodarone (PACERONE) 400 MG tablet Take 0.5 tablets (200 mg total) by mouth daily. 11/24/16   Jettie Booze, MD  carvedilol (COREG) 12.5 MG tablet Take 1 tablet (12.5 mg total) by mouth 2 (two) times daily with a meal. 11/05/16   Belkys A Regalado, MD  diazepam (VALIUM) 10 MG tablet Take 0.5 tablets (5 mg total) by mouth daily as needed for anxiety or sleep. 07/04/16   Belkys A Regalado, MD  diclofenac sodium (VOLTAREN) 1 % GEL Apply 2 g topically 2 (two) times daily as needed (for pain).    Historical Provider, MD  digoxin (LANOXIN) 0.125 MG tablet Take 1 tablet (0.125 mg total) by mouth daily. 09/12/15   Liliane Shi, PA-C  fluticasone (FLONASE) 50 MCG/ACT nasal spray Place 2 sprays into both nostrils daily as needed for allergies.     Historical Provider, MD  gabapentin (NEURONTIN) 600 MG tablet Take 1-3 tablets (600-1,800 mg total) by mouth See admin instructions. Take 1 tablet (600 mg) by mouth daily with lunch,3 tablets (1800 mg) at bedtime 07/04/16   Belkys A Regalado, MD  gabapentin (NEURONTIN) 800 MG tablet Take 800 mg by mouth as directed. 10/01/16   Historical Provider, MD  glipiZIDE (GLUCOTROL) 10 MG tablet Take 10 mg by mouth 2 (two) times daily before a meal.     Historical Provider, MD  glucose 4 GM chewable tablet Chew 4 tablets by mouth See admin instructions. Chew 4  tablets by mouth as needed for low blood sugar - repeat every 15 minutes if blood sugar less than 70    Historical Provider, MD  hydrocortisone (ANUSOL-HC) 2.5 % rectal cream Place 1 application rectally 2 (two) times daily as needed for hemorrhoids or itching. Use up to 2 weeks at a time as needed    Historical Provider, MD  hydrocortisone 1 % lotion Apply 1 application topically See admin instructions. Apply small amount to back twice daily for itchy rash    Historical Provider, MD  insulin detemir (LEVEMIR) 100 UNIT/ML injection Inject 0.2 mLs (20 Units total) into the skin every evening. 11/05/16   Belkys A Regalado, MD  ipratropium (ATROVENT) 0.02 % nebulizer solution Take 2.5 mLs (0.5 mg total) by nebulization every 6 (six) hours as needed for wheezing or shortness of breath. 08/04/16   Hosie Poisson, MD  magnesium oxide (MAG-OX) 400 (241.3 Mg) MG tablet Take 1 tablet (400 mg total) by mouth 2 (  two) times daily. 11/05/16   Belkys A Regalado, MD  metFORMIN (GLUCOPHAGE-XR) 500 MG 24 hr tablet Take 1,000 mg by mouth 2 (two) times daily with a meal.    Historical Provider, MD  methadone (DOLOPHINE) 10 MG tablet Take 1 tablet (10 mg total) by mouth every 12 (twelve) hours. 08/04/16   Hosie Poisson, MD  mometasone-formoterol (DULERA) 200-5 MCG/ACT AERO Inhale 2 puffs into the lungs 2 (two) times daily.    Historical Provider, MD  potassium chloride SA (K-DUR,KLOR-CON) 20 MEQ tablet Take 2 tablets (40 mEq total) by mouth daily. 11/07/14   Kinnie Feil, MD  PRESCRIPTION MEDICATION Pt states he has been taking a antibiotic for the last few days, pt states it was for his cold, called his [facility] where he said he had it filled and they haven't filled it there, pt said he doesn't care if we clarify it or not, wont be bringing in medication    Historical Provider, MD  rivaroxaban (XARELTO) 20 MG TABS tablet Take 1 tablet (20 mg total) by mouth daily with supper. 02/23/15   Luan Moore, MD  rosuvastatin (CRESTOR) 40  MG tablet Take 20 mg by mouth daily. For cholesterol    Historical Provider, MD  sacubitril-valsartan (ENTRESTO) 49-51 MG Take 1 tablet by mouth 2 (two) times daily. 09/12/15   Liliane Shi, PA-C  spironolactone (ALDACTONE) 25 MG tablet Take 1 tablet (25 mg total) by mouth daily. 08/02/15   Liliane Shi, PA-C  torsemide (DEMADEX) 20 MG tablet Take 4 tablets (80 mg total) by mouth daily. 11/06/16   Elmarie Shiley, MD    Family History Family History  Problem Relation Age of Onset  . Heart disease Mother   . Hypertension Mother   . Heart failure Mother   . Heart disease Father   . Hypertension Sister   . Heart attack Neg Hx   . Stroke Neg Hx     Social History Social History  Substance Use Topics  . Smoking status: Current Every Day Smoker    Packs/day: 0.05    Types: Cigarettes    Last attempt to quit: 08/30/2015  . Smokeless tobacco: Never Used  . Alcohol use No    Allergies   Patient has no known allergies.   Review of Systems Review of Systems  Unable to perform ROS: Severe respiratory distress   Physical Exam Updated Vital Signs BP (!) 139/95   Pulse (!) 123   Temp 98.7 F (37.1 C) (Oral)   Resp (!) 24   Ht 6' (1.829 m)   Wt 274 lb 4 oz (124.4 kg)   SpO2 100%   BMI 37.20 kg/m   Physical Exam  Constitutional: He appears well-developed and well-nourished. He appears lethargic.  HENT:  Head: Normocephalic and atraumatic.  Right Ear: External ear normal.  Left Ear: External ear normal.  Nose: Nose normal.  Eyes: Right eye exhibits no discharge. Left eye exhibits no discharge.  Neck: Neck supple.  Cardiovascular: Normal heart sounds.  An irregular rhythm present. Tachycardia present.   Pulmonary/Chest: Effort normal. Tachypnea noted. He has wheezes (diffuse expiratory wheezes).  Abdominal: Soft. There is no tenderness.  Musculoskeletal: He exhibits edema (2+ pitting edema to BLE).  Neurological: He appears lethargic.  He is lethargic, but arouses to  voice.  Skin: Skin is warm. He is diaphoretic.  Nursing note and vitals reviewed.  ED Treatments / Results  DIAGNOSTIC STUDIES:  Oxygen Saturation is 98% on BiPAP, normal by my interpretation.  COORDINATION OF CARE:  2:51 AM Discussed treatment plan with pt at bedside and pt agreed to plan.   Labs (all labs ordered are listed, but only abnormal results are displayed) Labs Reviewed  COMPREHENSIVE METABOLIC PANEL - Abnormal; Notable for the following:       Result Value   Chloride 97 (*)    CO2 34 (*)    Glucose, Bld 318 (*)    BUN <5 (*)    Calcium 8.3 (*)    Albumin 2.9 (*)    All other components within normal limits  BRAIN NATRIURETIC PEPTIDE - Abnormal; Notable for the following:    B Natriuretic Peptide 555.1 (*)    All other components within normal limits  TROPONIN I - Abnormal; Notable for the following:    Troponin I 0.03 (*)    All other components within normal limits  CBC WITH DIFFERENTIAL/PLATELET - Abnormal; Notable for the following:    Hemoglobin 12.5 (*)    MCHC 29.1 (*)    RDW 16.9 (*)    All other components within normal limits  URINALYSIS, ROUTINE W REFLEX MICROSCOPIC - Abnormal; Notable for the following:    Glucose, UA >=500 (*)    Protein, ur 100 (*)    All other components within normal limits  BLOOD GAS, ARTERIAL - Abnormal; Notable for the following:    pH, Arterial 7.151 (*)    pCO2 arterial 109 (*)    Bicarbonate 36.5 (*)    Acid-Base Excess 7.8 (*)    All other components within normal limits  LACTIC ACID, PLASMA - Abnormal; Notable for the following:    Lactic Acid, Venous 2.8 (*)    All other components within normal limits  BASIC METABOLIC PANEL - Abnormal; Notable for the following:    Chloride 96 (*)    Glucose, Bld 357 (*)    Calcium 8.6 (*)    All other components within normal limits  GLUCOSE, CAPILLARY - Abnormal; Notable for the following:    Glucose-Capillary 344 (*)    All other components within normal limits  I-STAT  ARTERIAL BLOOD GAS, ED - Abnormal; Notable for the following:    pCO2 arterial 50.6 (*)    pO2, Arterial 153.0 (*)    Bicarbonate 33.6 (*)    Acid-Base Excess 8.0 (*)    All other components within normal limits  I-STAT CHEM 8, ED - Abnormal; Notable for the following:    Chloride 100 (*)    BUN 5 (*)    Glucose, Bld 315 (*)    Calcium, Ion 1.10 (*)    All other components within normal limits  I-STAT CG4 LACTIC ACID, ED - Abnormal; Notable for the following:    Lactic Acid, Venous 2.49 (*)    All other components within normal limits  CBG MONITORING, ED - Abnormal; Notable for the following:    Glucose-Capillary 342 (*)    All other components within normal limits  CULTURE, BLOOD (ROUTINE X 2)  CULTURE, BLOOD (ROUTINE X 2)  CULTURE, RESPIRATORY (NON-EXPECTORATED)  MRSA PCR SCREENING  INFLUENZA PANEL BY PCR (TYPE A & B)  PROCALCITONIN  MAGNESIUM  PHOSPHORUS  TROPONIN I  TROPONIN I  I-STAT TROPOININ, ED    EKG  EKG Interpretation None       Radiology Dg Chest Portable 1 View  Result Date: 12/13/2016 CLINICAL DATA:  71 y/o  M; tube change. EXAM: PORTABLE CHEST 1 VIEW COMPARISON:  12/13/2016 chest radiograph FINDINGS: Lung bases and diaphragm are excluded  from the field of view. Endotracheal tube is 5.3 cm from the carina. Cardiomegaly. Diffuse pulmonary edema. Enteric tube tip extends below the field of view. IMPRESSION: Lung bases and diaphragm are excluded from the field of view. Endotracheal tube is 5.3 cm from the carina. Cardiomegaly. Diffuse pulmonary edema. Enteric tube tip extends below the field of view. Electronically Signed   By: Kristine Garbe M.D.   On: 12/13/2016 05:27   Dg Chest Portable 1 View  Result Date: 12/13/2016 CLINICAL DATA:  Intubation EXAM: PORTABLE CHEST 1 VIEW COMPARISON:  12/13/2016 FINDINGS: Endotracheal tube tip is approximately 5.1 cm superior to the carina. Continued cardiomegaly with central vascular congestion can moderate  diffuse pulmonary edema. Consolidation at the left lung base. Cannot exclude small left pleural effusion. No pneumothorax. Non inclusion of the right CP angle. IMPRESSION: Endotracheal tube tip is approximately 5.1 cm superior to the carina. Continued cardiomegaly with central vascular congestion and moderate diffuse pulmonary edema. Probable small left pleural effusion. Electronically Signed   By: Donavan Foil M.D.   On: 12/13/2016 03:41   Dg Chest Portable 1 View  Result Date: 12/13/2016 CLINICAL DATA:  Acute onset shortness of breath. Flu-like symptoms for a few days. Low oxygen saturation. EXAM: PORTABLE CHEST 1 VIEW COMPARISON:  10/31/2016 FINDINGS: Cardiac enlargement with pulmonary vascular congestion. Diffuse bilateral airspace infiltration in the lungs, progressing since prior study. This consistent with edema. Small bilateral pleural effusions. No pneumothorax. Bilateral cervical ribs. IMPRESSION: Progressing congestive changes in the heart and lungs with cardiac enlargement, pulmonary vascular congestion, bilateral airspace edema, and small pleural effusions. Electronically Signed   By: Lucienne Capers M.D.   On: 12/13/2016 03:14    Procedures Date/Time: 12/13/2016 3:27 AM Performed by: Sherwood Gambler Pre-anesthesia Checklist: Patient identified, Emergency Drugs available, Suction available and Patient being monitored Preoxygenation: Pre-oxygenation with 100% oxygen (through BIPAP) Intubation Type: Rapid sequence Laryngoscope Size: Glidescope and 3 Grade View: Grade I Tube size: 7.5 mm Number of attempts: 1 Airway Equipment and Method: Video-laryngoscopy Placement Confirmation: ETT inserted through vocal cords under direct vision,  Positive ETCO2,  CO2 detector and Breath sounds checked- equal and bilateral Secured at: 25 (lips) cm Tube secured with: ETT holder Dental Injury: Teeth and Oropharynx as per pre-operative assessment  Future Recommendations: Recommend- induction with  short-acting agent, and alternative techniques readily available Comments: First CXR with ETT just above carina, moved back to 23 at the lips    Procedure Name: Intubation Date/Time: 12/13/2016 5:03 AM Performed by: Sherwood Gambler Preoxygenation: Pre-oxygenation with 100% oxygen Ventilation: Mask ventilation with difficulty Laryngoscope Size: Glidescope and 3 Grade View: Grade I Number of attempts: 1 Airway Equipment and Method: Video-laryngoscopy and Rigid stylet Placement Confirmation: ETT inserted through vocal cords under direct vision and CO2 detector Secured at: 23 cm Tube secured with: Tape Dental Injury: Bloody posterior oropharynx       (including critical care time)  CRITICAL CARE Performed by: Sherwood Gambler T   Total critical care time: 50 minutes  Critical care time was exclusive of separately billable procedures and treating other patients.  Critical care was necessary to treat or prevent imminent or life-threatening deterioration.  Critical care was time spent personally by me on the following activities: development of treatment plan with patient and/or surrogate as well as nursing, discussions with consultants, evaluation of patient's response to treatment, examination of patient, obtaining history from patient or surrogate, ordering and performing treatments and interventions, ordering and review of laboratory studies, ordering and review of radiographic studies,  pulse oximetry and re-evaluation of patient's condition.   Medications Ordered in ED Medications  furosemide (LASIX) 120 mg in dextrose 5 % 50 mL IVPB (120 mg Intravenous Given 12/13/16 0648)  0.9 %  sodium chloride infusion (not administered)  pantoprazole sodium (PROTONIX) 40 mg/20 mL oral suspension 40 mg (not administered)  fentaNYL (SUBLIMAZE) injection 50 mcg (not administered)  fentaNYL (SUBLIMAZE) 2,500 mcg in sodium chloride 0.9 % 250 mL (10 mcg/mL) infusion (100 mcg/hr Intravenous  Rate/Dose Verify 12/13/16 0656)  fentaNYL (SUBLIMAZE) bolus via infusion 25 mcg (25 mcg Intravenous Bolus from Bag 12/13/16 0550)  midazolam (VERSED) injection 1 mg (not administered)  midazolam (VERSED) injection 1 mg (not administered)  digoxin (LANOXIN) tablet 0.125 mg (not administered)  rivaroxaban (XARELTO) tablet 20 mg (not administered)  rosuvastatin (CRESTOR) tablet 20 mg (not administered)  metoprolol (LOPRESSOR) injection 5 mg (not administered)  chlorhexidine gluconate (MEDLINE KIT) (PERIDEX) 0.12 % solution 15 mL (not administered)  MEDLINE mouth rinse (not administered)  insulin aspart (novoLOG) injection 0-15 Units (11 Units Subcutaneous Given 12/13/16 0653)  naloxone [facility]) injection 0.4 mg (0.4 mg Intravenous Given 12/13/16 0254)  vancomycin (VANCOCIN) 2,500 mg in sodium chloride 0.9 % 500 mL IVPB (2,500 mg Intravenous New Bag/Given 12/13/16 0354)  ceFEPIme (MAXIPIME) 2 g in dextrose 5 % 50 mL IVPB (0 g Intravenous Stopped 12/13/16 0402)  etomidate (AMIDATE) injection 30 mg (30 mg Intravenous Given 12/13/16 0318)  rocuronium (ZEMURON) injection 100 mg (100 mg Intravenous Given 12/13/16 0318)     Initial Impression / Assessment and Plan / ED Course  I have reviewed the triage vital signs and the nursing notes.  Pertinent labs & imaging results that were available during my care of the patient were reviewed by me and considered in my medical decision making (see chart for details).     I discussed patient's severe disease with his wife at the bedside. She endorses that he has been and does want to be intubated if needed. Given patient's lethargy, significant pulmonary edema as well as severe respiratory acidosis I do not think he will tolerate BiPAP well. Thus he was preoxygenated with BiPAP but then intubated. This was without difficulty. Placed on hospital-acquired antibiotics. He was already given steroids and magnesium by EMS. A dose of Narcan was tried to to his large doses of  narcotics he is on at home, however given his complex presentation and think airway intervention was most needed. While in the ED he was noted to have a likely cuff leak. His ET tube was changed. This was attempted over bougie at first after being given a bolus of fentanyl for sedation. However the tube was difficult to change. He became transiently hypoxic and thus was bagged and then reintubated with the glide scope. Sats back up to 100%. Patient admitted to the ICU in critical condition.  Final Clinical Impressions(s) / ED Diagnoses   Final diagnoses:  Acute respiratory failure with hypoxia and hypercapnia ([place])    New Prescriptions Current Discharge Medication List     I personally performed the services described in this documentation, which was scribed in my presence. The recorded information has been reviewed and is accurate.    Sherwood Gambler, MD 12/13/16 [phone]

## 2016-12-13 NOTE — ED Notes (Signed)
Dr.S. Minette Brine notified on elevated Troponin result.

## 2016-12-13 NOTE — Progress Notes (Signed)
Initial Nutrition Assessment  DOCUMENTATION CODES:  Obesity unspecified  INTERVENTION:  IF/When appropriate, Initiate TF via OGT/NGT at goal rate of 50 ml/h (1200 ml per day) and Prostat 60 ml BID to provide 1600 kcals, 165 gm protein, 1003 ml free water daily.   NUTRITION DIAGNOSIS:  Inadequate oral intake related to inability to eat as evidenced by NPO status.  GOAL:  Provide needs based on ASPEN/SCCM guidelines  MONITOR:  Labs, Vent status, Diet advancement, I & O's  REASON FOR ASSESSMENT:  Ventilator    ASSESSMENT:  71 y/o male PMHx CAD, CHF, DM2, HLD, HTN, AFIB, htn, COPD, tobacco abuse. Presents from home with SOB and flu like symptoms for past few days. Urgently intubated on arrival to the ED due to ABG values. Admitted for Acute on chronic resp failure secondary to decompensated HF and +/- viral syndrome.   Pt intubated, sedated. Has had ongoing torsades requiring multiple shocks. No family present at this time.   Patient is currently intubated on ventilator support MV: 13.8 L/min Temp (24hrs), Avg:98.4 F (36.9 C), Min:97.4 F (36.3 C), Max:99.1 F (37.3 C)  Propofol: None  His weight varies with his HF, but appears to be overall stable long term between 260-280 lbs.    Labs reviewed:  BG 200-275, Phos 1, Mag 2.9, hypokalemia Medications: Insulin, PPI, Fentanyl, versed,    Recent Labs Lab 12/13/16 0253  12/13/16 0441 12/13/16 1353 12/13/16 1451  NA 142  < > 140 143 146*  K 3.6  < > 3.7 4.2 3.3*  CL 97*  < > 96* 101 96*  CO2 34*  --  31 31  --   BUN <5*  < > _0 CREATININE 0.76  < > 0.74 0.91 0.80  CALCIUM 8.3*  --  8.6* 8.7*  --   MG  --   --  2.1 2.9*  --   PHOS  --   --  3.7 1.0*  --   GLUCOSE 318*  < > 357* 248* 242*  < > = values in this interval not displayed.;  Diet Order:  Diet NPO time specified  Skin: Open diabetic ulcer to Left leg  Last BM:  Unknown  Height:   Ht Readings from Last 1 Encounters:  12/13/16 6' (1.829 m)     Weight:  Wt Readings from Last 1 Encounters:  12/13/16 274 lb 4 oz (124.4 kg)   Wt Readings from Last 10 Encounters:  12/13/16 274 lb 4 oz (124.4 kg)  11/24/16 284 lb 6.4 oz (129 kg)  11/05/16 259 lb 1.6 oz (117.5 kg)  09/16/16 265 lb (120.2 kg)  08/18/16 292 lb (132.5 kg)  08/04/16 285 lb 7.9 oz (129.5 kg)  07/22/16 261 lb 12.8 oz (118.8 kg)  07/04/16 274 lb 11.2 oz (124.6 kg)  05/25/16 278 lb 4.8 oz (126.2 kg)  03/24/16 285 lb 6.4 oz (129.5 kg)   Ideal Body Weight:  80.91 kg  BMI:  Body mass index is 37.2 kg/m.  Estimated Nutritional Needs:  Kcal:  1370-1740 kcals (11-14 kcal/kg bw) Protein:  >162 g Pro (2 g/kg IBW) Fluid:  Per MD  EDUCATION NEEDS:  No education needs identified at this time  Burtis Junes RD, LDN, [facility] Pager: [phone] 12/13/2016 4:57 PM

## 2016-12-13 NOTE — Progress Notes (Signed)
Chaplain responded to Code.  Patient not available.  No family available.  Patient back in stable condition per nurse.   12/13/16 1103  Clinical Encounter Type  Visited With Patient not available  Visit Type Initial;Code  Referral From Nurse   Berneice Heinrich Chaplain Resident

## 2016-12-13 NOTE — Progress Notes (Signed)
Pt intubated per ED MD. No complications noted.

## 2016-12-13 NOTE — ED Triage Notes (Signed)
Patient from home, acute onset of shortness of breath.  He has been having flu like symptoms for the last few days.  Patient was found in the 60's oxygen sat.  EMS placed patient on CPAP, given 72m Albuterol/0.578matrovent, 12515molumedrol and 2 grams of Magnesium.  Patient very sleepy upon arrival to ED.  CBG of 414.

## 2016-12-13 NOTE — Significant Event (Signed)
Called to bedside for torsades ventricular rhythm that was pulseless. Before CPR or defibrillation could be instituted he converted to SR with strong pulse and good blood pressure. Mag 2 gm iv over 30 minutes given.  Follow up labs and 12 lead ordered. Dr. Nelda Marseille aware and at bedside.  BP 139/74   Pulse 94   Temp 98.6 F (37 C) (Oral)   Resp (!) 24   Ht 6' (1.829 m)   Wt 274 lb 4 oz (124.4 kg)   SpO2 99%   BMI 37.20 kg/m     Intake/Output Summary (Last 24 hours) at 12/13/16 1101 Last data filed at 12/13/16 1000  Gross per 24 hour  Intake           233.13 ml  Output             4725 ml  Net         -4491.87 ml  Dg Chest Portable 1 View  Result Date: 12/13/2016 CLINICAL DATA:  71 y/o  M; tube change. EXAM: PORTABLE CHEST 1 VIEW COMPARISON:  12/13/2016 chest radiograph FINDINGS: Lung bases and diaphragm are excluded from the field of view. Endotracheal tube is 5.3 cm from the carina. Cardiomegaly. Diffuse pulmonary edema. Enteric tube tip extends below the field of view. IMPRESSION: Lung bases and diaphragm are excluded from the field of view. Endotracheal tube is 5.3 cm from the carina. Cardiomegaly. Diffuse pulmonary edema. Enteric tube tip extends below the field of view. Electronically Signed   By: Kristine Garbe M.D.   On: 12/13/2016 05:27   Dg Chest Portable 1 View  Result Date: 12/13/2016 CLINICAL DATA:  Intubation EXAM: PORTABLE CHEST 1 VIEW COMPARISON:  12/13/2016 FINDINGS: Endotracheal tube tip is approximately 5.1 cm superior to the carina. Continued cardiomegaly with central vascular congestion can moderate diffuse pulmonary edema. Consolidation at the left lung base. Cannot exclude small left pleural effusion. No pneumothorax. Non inclusion of the right CP angle. IMPRESSION: Endotracheal tube tip is approximately 5.1 cm superior to the carina. Continued cardiomegaly with central vascular congestion and moderate diffuse pulmonary edema. Probable small left pleural  effusion. Electronically Signed   By: Donavan Foil M.D.   On: 12/13/2016 03:41   Dg Chest Portable 1 View  Result Date: 12/13/2016 CLINICAL DATA:  Acute onset shortness of breath. Flu-like symptoms for a few days. Low oxygen saturation. EXAM: PORTABLE CHEST 1 VIEW COMPARISON:  10/31/2016 FINDINGS: Cardiac enlargement with pulmonary vascular congestion. Diffuse bilateral airspace infiltration in the lungs, progressing since prior study. This consistent with edema. Small bilateral pleural effusions. No pneumothorax. Bilateral cervical ribs. IMPRESSION: Progressing congestive changes in the heart and lungs with cardiac enlargement, pulmonary vascular congestion, bilateral airspace edema, and small pleural effusions. Electronically Signed   By: Lucienne Capers M.D.   On: 12/13/2016 03:14    Recent Labs Lab 12/13/16 0253 12/13/16 0309 12/13/16 0441  NA 142 144 140  K 3.6 3.5 3.7  CL 97* 100* 96*  CO2 34*  --  31  BUN <5* 5* 6  CREATININE 0.76 0.70 0.74  GLUCOSE 318* 315* 357*    Recent Labs Lab 12/13/16 0253 12/13/16 0309  HGB 12.5* 15.3  HCT 42.9 45.0  WBC 8.0  --   PLT 362  --     I/P Torsades with brief minute of pulseless activity with spontaneous return of SR. 2 gm mag IV and check bmet/trop/12 lead Will need cards consult if recurrence.    Richardson Landry Minor ACNP Maryanna Shape PCCM Pager  [phone] till 3 pm If no answer page [phone] 12/13/2016, 11:03 AM  Rush Farmer, M.D. [facility] Pulmonary/Critical Care Medicine. Pager: [phone]. After hours pager: [phone].

## 2016-12-13 NOTE — Progress Notes (Signed)
PULMONARY / CRITICAL CARE MEDICINE   Name: Nicholas Caldwell MRN: [id number] DOB: Dec 05, 1945    ADMISSION DATE:  12/13/2016 CONSULTATION DATE:    REFERRING MD:  EDP  CHIEF COMPLAINT:  Shortness of breath  HISTORY OF PRESENT ILLNESS:   Nicholas Caldwell is a 71M with PMH significant for CAD, systolic and diastolic CHF (EF 19-14%), DMII, HLD, HTN, paroxysmal a fib/flutter, pHTN, COPD and tobacco abuse who presents from home via EMS with acute shortness of breath and flu like symptoms for the past few days. On arrival EMS noted sats in the 60s on RA, gave him bronchodilators, 124m solumedrol, 2g mag and put him on CPAP. Despite this he was markedly somnolent on arrival to the ED and was urgently intubated after ABG on BiPAP resulted with pH 7.151, pCO2 109, pO2 87, HCO3 36.5.  Per the notes he had also complained of chest pain, leg swelling and productive cough x 3 days.  Labs demonstrate normal WBC, diff pending, elevated lactate @ 2.49, renal fxn okay with Cr 0.7 / BUN 5. CXR with bilateral infiltrates c/w pulmonary edema, enlarged cardiac silhouette, ETT 5.1 above carina and probable L effusion.  Wife provides additional history that he has had increasing lower extremity edema, abdominal girth and shortness of breath over the last 3-5 days. He had subjective fever and cough productive of whitish-yellow phlegm. He was using 3L of O2 near-continuously. He had a few episodes of loose stool and urgency where he didn't make it to the bathroom in time. No chest pain. No palpitations. He was not using his inhalers more. He did try some OTC cough remedies.   SUBJECTIVE:  Post arrest but follows commands  VITAL SIGNS: BP 139/74   Pulse 94   Temp 98.6 F (37 C) (Oral)   Resp (!) 24   Ht 6' (1.829 m)   Wt 274 lb 4 oz (124.4 kg)   SpO2 99%   BMI 37.20 kg/m   HEMODYNAMICS:    VENTILATOR SETTINGS: Vent Mode: PRVC FiO2 (%):  [40 %-60 %] 40 % Set Rate:  [24 bmp] 24 bmp Vt Set:  [580 mL-610 mL] 580  mL PEEP:  [5 cmH20] 5 cmH20 Plateau Pressure:  [23 cmH20-33 cmH20] 23 cmH20  INTAKE / OUTPUT: I/O last 3 completed shifts: In: 173.1 [I.V.:71.1; Other:40; IV Piggyback:62] Out: 17829[[FAOZH:0865] PHYSICAL EXAMINATION:  General Well nourished, well developed, obese, intubated, follows commands  HEENT No gross abnormalities. OETT / OGT in place   Pulmonary Coarse breath sounds bilaterally, improved with suctioning, occasional wheeze on anterior exam. Vent-assisted effort, symmetrical expansion.   Cardiovascular Irregularly irregular rhythm, rate 89. S1, s2. No m/r/g. Distal pulses palpable.  Abdomen Soft, non-tender, obese, positive bowel sounds, no palpable organomegaly or masses. Normoresonant to percussion.  Musculoskeletal Grossly normal  Lymphatics No cervical, supraclavicular or axillary adenopathy.   Neurologic Awake and alert  Skin/Integuement No rash, no cyanosis, no clubbing, 2+ edema bilateral lower extremities    LABS:  BMET  Recent Labs Lab 12/13/16 0253 12/13/16 0309 12/13/16 0441  NA 142 144 140  K 3.6 3.5 3.7  CL 97* 100* 96*  CO2 34*  --  31  BUN <5* 5* 6  CREATININE 0.76 0.70 0.74  GLUCOSE 318* 315* 357*    Electrolytes  Recent Labs Lab 12/13/16 0253 12/13/16 0441  CALCIUM 8.3* 8.6*  MG  --  2.1  PHOS  --  3.7    CBC  Recent Labs Lab 12/13/16 0253 12/13/16 0309  WBC  8.0  --   HGB 12.5* 15.3  HCT 42.9 45.0  PLT 362  --     Coag's No results for input(s): APTT, INR in the last 168 hours.  Sepsis Markers  Recent Labs Lab 12/13/16 0308 12/13/16 0338 12/13/16 0441  LATICACIDVEN 2.49* 2.8*  --   PROCALCITON  --   --  0.28    ABG  Recent Labs Lab 12/13/16 0304 12/13/16 0446  PHART 7.151* 7.429  PCO2ART 109* 50.6*  PO2ART 87.0 153.0*    Liver Enzymes  Recent Labs Lab 12/13/16 0253  AST 37  ALT 17  ALKPHOS 124  BILITOT 0.6  ALBUMIN 2.9*    Cardiac Enzymes  Recent Labs Lab 12/13/16 0253  TROPONINI 0.03*     Glucose  Recent Labs Lab 12/13/16 0306 12/13/16 0546 12/13/16 0810  GLUCAP 342* 344* 265*    Imaging Dg Chest Portable 1 View  Result Date: 12/13/2016 CLINICAL DATA:  71 y/o  M; tube change. EXAM: PORTABLE CHEST 1 VIEW COMPARISON:  12/13/2016 chest radiograph FINDINGS: Lung bases and diaphragm are excluded from the field of view. Endotracheal tube is 5.3 cm from the carina. Cardiomegaly. Diffuse pulmonary edema. Enteric tube tip extends below the field of view. IMPRESSION: Lung bases and diaphragm are excluded from the field of view. Endotracheal tube is 5.3 cm from the carina. Cardiomegaly. Diffuse pulmonary edema. Enteric tube tip extends below the field of view. Electronically Signed   By: Kristine Garbe M.D.   On: 12/13/2016 05:27   Dg Chest Portable 1 View  Result Date: 12/13/2016 CLINICAL DATA:  Intubation EXAM: PORTABLE CHEST 1 VIEW COMPARISON:  12/13/2016 FINDINGS: Endotracheal tube tip is approximately 5.1 cm superior to the carina. Continued cardiomegaly with central vascular congestion can moderate diffuse pulmonary edema. Consolidation at the left lung base. Cannot exclude small left pleural effusion. No pneumothorax. Non inclusion of the right CP angle. IMPRESSION: Endotracheal tube tip is approximately 5.1 cm superior to the carina. Continued cardiomegaly with central vascular congestion and moderate diffuse pulmonary edema. Probable small left pleural effusion. Electronically Signed   By: Donavan Foil M.D.   On: 12/13/2016 03:41   Dg Chest Portable 1 View  Result Date: 12/13/2016 CLINICAL DATA:  Acute onset shortness of breath. Flu-like symptoms for a few days. Low oxygen saturation. EXAM: PORTABLE CHEST 1 VIEW COMPARISON:  10/31/2016 FINDINGS: Cardiac enlargement with pulmonary vascular congestion. Diffuse bilateral airspace infiltration in the lungs, progressing since prior study. This consistent with edema. Small bilateral pleural effusions. No pneumothorax.  Bilateral cervical ribs. IMPRESSION: Progressing congestive changes in the heart and lungs with cardiac enlargement, pulmonary vascular congestion, bilateral airspace edema, and small pleural effusions. Electronically Signed   By: Lucienne Capers M.D.   On: 12/13/2016 03:14     STUDIES:  CXR as above  CULTURES: Flu swab pending Blood cx pending  ANTIBIOTICS: Vancomycin 2/10 >> Cefepime 2/10 >>  SIGNIFICANT EVENTS: Intubation in ED  LINES/TUBES: OETT 2/10 >> OGT 2/10 >> Foley 2/10 >>  DISCUSSION: Mr. Banh is a 44M with PMH as above presenting with acute on chronic hypoxemic / hypercapneic respiratory failure in the setting of known systolic/diastolic dysfunction and COPD. Some concern for acute viral syndrome with symptom onset > 48h ago. Currently in a fib with RVR, but maintaining adequate BP. 2/10 torsades with spontaneous resolution.   ASSESSMENT / PLAN:  PULMONARY A: Acute on chronic hypoxemic/hypercapneic respiratory failure 2/2 acute decompensated mixed CHF +/- viral syndrome Underlying COPD (no PFTs on file), without overt  acute exacerbation Pulmonary HTN (RVSP 57 on last TTE) P:   Continue MV Wean as tolerated DuoNebs q6h  Albuterol q4h prn No steroids for now Diurese Check Flu Tracheal aspirate  CARDIOVASCULAR A:  A fib/flutter with RVR - on xarelto, dig, amio at home Acute decompensated CHF (EF 35-40%) Episode of torsades 2/10 with spontaneous resolution Prolonged qt interval  P:  Diurese - lasix 158m given in ED(neg 4l) Continue digoxin Prn metoprolol iv for HR > 120; hold for SBP < 110 2 GM mag given  RENAL Lab Results  Component Value Date   CREATININE 0.74 12/13/2016   CREATININE 0.70 12/13/2016   CREATININE 0.76 12/13/2016   CREATININE 0.76 03/24/2016   CREATININE 0.79 10/01/2015   CREATININE 0.80 09/12/2015    Recent Labs Lab 12/13/16 0253 12/13/16 0309 12/13/16 0441  K 3.6 3.5 3.7    Mag 2.3 A:   No acute issues - Cr  0.7 Neg 4 litres x 24 hours P:   Monitor renal function  GASTROINTESTINAL A:   Reported diarrhea P:   NPO Stress ulcer ppx Monitor bowel fxn  HEMATOLOGIC  Recent Labs  12/13/16 0253 12/13/16 0309  HGB 12.5* 15.3    A:   Mild anemia 12.5 Chronic a/c for a fib/flutter P:  Trend CBC Continue home xarelto  INFECTIOUS A:   Possible viral syndrome; doubt bacterial infxn P:   Follow cultures, flu panel  PCT 0.28 De-escalate abx as able  ENDOCRINE CBG (last 3)   Recent Labs  12/13/16 0306 12/13/16 0546 12/13/16 0810  GLUCAP 342* 344* 265*     A:   IDDM   P:   CBG  SSI  NEUROLOGIC A:   No acute issues, but exam limited by sedation 2/10 awake and alert post code P:   RASS goal: 0 to -1 Wean sedation; stop versed gtt, change to prn Fentanyl gtt for analgesia   FAMILY  - Updates: Wife at bedside. Per our discussion and her prior discussion with the patient, he would desire to be FULL CODE.  - Inter-disciplinary family meet or Palliative Care meeting due by:  day 7  App cct 40 min  SRichardson LandryMinor ACNP LMaryanna ShapePCCM Pager [phone] 3 pm If no answer page [phone]/08/2017, 11:11 AM  Attending Note:  71year old male with COPD and CHF presenting post flu like illness in respiratory failure requiring intubation and mechanical ventilation.  Patient keeps going in and out of torsades.  On exam, he is awake when in sinus.  Has required >8 shocks so far.  I called EP and came saw patient.  Feels hypokalemia is the cause.  That is being treated.  Amiodarone and lidocaine going at this point.  Will sedate patient with versed and fentanyl so as to not have to endure shocks.  Obtained consent from wife incase we have to place a central line for vasopressor support to treat sedation induced hypotension.    The patient is critically ill with multiple organ systems failure and requires high complexity decision making for assessment and support, frequent  evaluation and titration of therapies, application of advanced monitoring technologies and extensive interpretation of multiple databases.   Critical Care Time devoted to patient care services described in this note is  35  Minutes. This time reflects time of care of this signee Dr WJennet Maduro This critical care time does not reflect procedure time, or teaching time or supervisory time of PA/NP/Med student/Med Resident etc but could involve  care discussion time.  Rush Farmer, M.D. [facility] Pulmonary/Critical Care Medicine. Pager: [phone]. After hours pager: [phone].

## 2016-12-13 NOTE — Progress Notes (Signed)
Received report from ED nurse. Joaquin Bend E, [place] 12/13/2016 [phone]

## 2016-12-13 NOTE — Progress Notes (Signed)
Good volumes noted.

## 2016-12-13 NOTE — Progress Notes (Signed)
[place] Progress Note Patient Name: Nicholas Caldwell DOB: 04-Oct-1946 MRN: [id number]   Date of Service  12/13/2016  HPI/Events of Note  Notified by bedside nurse that patient's K now 3.8 & phosphorus <1.0. Magnesium is 3.3.   eICU Interventions  1. Giving KPhos 16mol IV now 2. Repeat electrolytes in AM     Intervention Category Intermediate Interventions: Electrolyte abnormality - evaluation and management  JTera Partridge2/08/2017, 9:14 PM

## 2016-12-13 NOTE — ED Notes (Signed)
Dr. Verta Ellen at bedside intubating pt. with RT.

## 2016-12-14 ENCOUNTER — Inpatient Hospital Stay (HOSPITAL_COMMUNITY): Payer: Medicare Other

## 2016-12-14 DIAGNOSIS — J9601 Acute respiratory failure with hypoxia: Secondary | ICD-10-CM

## 2016-12-14 DIAGNOSIS — J9602 Acute respiratory failure with hypercapnia: Secondary | ICD-10-CM

## 2016-12-14 DIAGNOSIS — I4891 Unspecified atrial fibrillation: Secondary | ICD-10-CM

## 2016-12-14 DIAGNOSIS — G9341 Metabolic encephalopathy: Secondary | ICD-10-CM

## 2016-12-14 DIAGNOSIS — I471 Supraventricular tachycardia, unspecified: Secondary | ICD-10-CM

## 2016-12-14 DIAGNOSIS — G9340 Encephalopathy, unspecified: Secondary | ICD-10-CM

## 2016-12-14 DIAGNOSIS — J810 Acute pulmonary edema: Secondary | ICD-10-CM

## 2016-12-14 DIAGNOSIS — R570 Cardiogenic shock: Secondary | ICD-10-CM

## 2016-12-14 LAB — BASIC METABOLIC PANEL
ANION GAP: 12 (ref 5–15)
BUN: 8 mg/dL (ref 6–20)
CHLORIDE: 101 mmol/L (ref 101–111)
CO2: 29 mmol/L (ref 22–32)
Calcium: 7.7 mg/dL — ABNORMAL LOW (ref 8.9–10.3)
Creatinine, Ser: 0.91 mg/dL (ref 0.61–1.24)
GFR calc Af Amer: >60 mL/min (ref >60)
Glucose, Bld: 186 mg/dL — ABNORMAL HIGH (ref 65–99)
POTASSIUM: 3.9 mmol/L (ref 3.5–5.1)
Sodium: 142 mmol/L (ref 135–145)

## 2016-12-14 LAB — ECHOCARDIOGRAM COMPLETE
AVLVOTPG: 7 mmHg
E decel time: 232 msec
E/e' ratio: 12.68
FS: 38 % (ref 28–44)
Height: 72 in
IVS/LV PW RATIO, ED: 0.93
LA ID, A-P, ES: 45 mm
LA diam end sys: 45 mm
LA vol index: 57.8 mL/m2
LADIAMINDEX: 1.84 cm/m2
LAVOL: 141 mL
LAVOLA4C: 124 mL
LV E/e'average: 12.68
LV TDI E'LATERAL: 7.54
LV e' LATERAL: 7.54 cm/s
LVEEMED: 12.68
LVOT VTI: 23.9 cm
LVOT area: 4.15 cm2
LVOT peak vel: 132 cm/s
LVOTD: 23 mm
LVOTSV: 99 mL
MV Dec: 232
MVPG: 4 mmHg
MVPKAVEL: 58.7 m/s
MVPKEVEL: 95.6 m/s
PW: 13.8 mm — AB (ref 0.6–1.1)
RV LATERAL S' VELOCITY: 13.6 cm/s
RV TAPSE: 19.5 mm
RV sys press: 44 mmHg
Reg peak vel: 320 cm/s
TDI e' medial: 5.74
TRMAXVEL: 320 cm/s
WEIGHTICAEL: 4388.04 [oz_av]

## 2016-12-14 LAB — GLUCOSE, CAPILLARY
GLUCOSE-CAPILLARY: 132 mg/dL — AB (ref 65–99)
GLUCOSE-CAPILLARY: 174 mg/dL — AB (ref 65–99)
GLUCOSE-CAPILLARY: 179 mg/dL — AB (ref 65–99)
GLUCOSE-CAPILLARY: 190 mg/dL — AB (ref 65–99)
GLUCOSE-CAPILLARY: 200 mg/dL — AB (ref 65–99)
Glucose-Capillary: 153 mg/dL — ABNORMAL HIGH (ref 65–99)

## 2016-12-14 LAB — PHOSPHORUS: PHOSPHORUS: 2.4 mg/dL — AB (ref 2.5–4.6)

## 2016-12-14 LAB — POTASSIUM: Potassium: 4.4 mmol/L (ref 3.5–5.1)

## 2016-12-14 LAB — CBC
HEMATOCRIT: 32.1 % — AB (ref 39.0–52.0)
HEMOGLOBIN: 9.6 g/dL — AB (ref 13.0–17.0)
MCH: 25.1 pg — ABNORMAL LOW (ref 26.0–34.0)
MCHC: 29.9 g/dL — ABNORMAL LOW (ref 30.0–36.0)
MCV: 83.8 fL (ref 78.0–100.0)
Platelets: 410 10*3/uL — ABNORMAL HIGH (ref 150–400)
RBC: 3.83 MIL/uL — ABNORMAL LOW (ref 4.22–5.81)
RDW: 16.9 % — AB (ref 11.5–15.5)
WBC: 8.8 10*3/uL (ref 4.0–10.5)

## 2016-12-14 LAB — CALCIUM, IONIZED: Calcium, Ionized, Serum: 5.2 mg/dL (ref 4.5–5.6)

## 2016-12-14 LAB — MAGNESIUM: Magnesium: 2.5 mg/dL — ABNORMAL HIGH (ref 1.7–2.4)

## 2016-12-14 MED ORDER — MIDAZOLAM HCL 2 MG/2ML IJ SOLN: 1.0000 mg | INTRAMUSCULAR | Status: DC | PRN

## 2016-12-14 MED ORDER — VANCOMYCIN HCL IN DEXTROSE 1-5 GM/200ML-% IV SOLN
1000.0000 mg | Freq: Two times a day (BID) | INTRAVENOUS | Status: DC
  Administered 2016-12-14 – 2016-12-15 (×2): 1000 mg via INTRAVENOUS
  Filled 2016-12-14 (×3): qty 200

## 2016-12-14 MED ORDER — CEFEPIME HCL 1 G IJ SOLR
1.0000 g | Freq: Three times a day (TID) | INTRAMUSCULAR | Status: DC
  Administered 2016-12-14 – 2016-12-16 (×6): 1 g via INTRAVENOUS
  Filled 2016-12-14 (×7): qty 1

## 2016-12-14 MED ORDER — ETOMIDATE 2 MG/ML IV SOLN
10.0000 mg | Freq: Once | INTRAVENOUS | Status: AC
  Administered 2016-12-14: 10 mg via INTRAVENOUS

## 2016-12-14 MED ORDER — FUROSEMIDE 10 MG/ML IJ SOLN
40.0000 mg | Freq: Four times a day (QID) | INTRAMUSCULAR | Status: AC
  Administered 2016-12-14 – 2016-12-15 (×3): 40 mg via INTRAVENOUS
  Filled 2016-12-14 (×3): qty 4

## 2016-12-14 MED ORDER — MIDAZOLAM BOLUS VIA INFUSION
1.0000 mg | INTRAVENOUS | Status: DC | PRN
  Filled 2016-12-14: qty 4

## 2016-12-14 MED ORDER — MIDAZOLAM HCL 2 MG/2ML IJ SOLN
1.0000 mg | INTRAMUSCULAR | Status: DC | PRN
  Administered 2016-12-14: 2 mg via INTRAVENOUS
  Administered 2016-12-14: 4 mg via INTRAVENOUS
  Administered 2016-12-15: 2 mg via INTRAVENOUS
  Administered 2016-12-15 – 2016-12-16 (×6): 4 mg via INTRAVENOUS
  Filled 2016-12-14 (×5): qty 4
  Filled 2016-12-14: qty 2
  Filled 2016-12-14: qty 4
  Filled 2016-12-14: qty 2
  Filled 2016-12-14: qty 4

## 2016-12-14 MED ORDER — SODIUM CHLORIDE 0.9 % IV SOLN
Freq: Once | INTRAVENOUS | Status: AC
  Administered 2016-12-14: 15:00:00 via INTRAVENOUS
  Filled 2016-12-14: qty 1000

## 2016-12-14 MED ORDER — MIDAZOLAM HCL 2 MG/2ML IJ SOLN
INTRAMUSCULAR | Status: AC
  Filled 2016-12-14: qty 2

## 2016-12-14 MED ORDER — POTASSIUM PHOSPHATES 15 MMOLE/5ML IV SOLN
30.0000 mmol | Freq: Once | INTRAVENOUS | Status: AC
  Administered 2016-12-14: 30 mmol via INTRAVENOUS
  Filled 2016-12-14: qty 10

## 2016-12-14 MED ORDER — MAGNESIUM SULFATE 2 GM/50ML IV SOLN
2.0000 g | Freq: Once | INTRAVENOUS | Status: AC
  Administered 2016-12-14: 2 g via INTRAVENOUS
  Filled 2016-12-14: qty 50

## 2016-12-14 NOTE — Progress Notes (Signed)
Phone call from Dr. Nelda Marseille,  Dr. Caryl Comes started the patient on Isoproterenol drip and lidocaine for Torsades de Pointes. The torsades has resolved but the patient started having rapid atrial fib and atrial bigeminy He was cardioverted 5 times today into NSR  by Dr. Nelda Marseille  We have decreased the Isoproterenol drip to 2 mcg / min to reduce the episodes of rapid AF and the atrial bigeminy.  K was still a little low this am. I have given additional Kphos and he will get a BMP tomorrow.  Hopefully we will be able to further titrate down the Isuprel drip and potentially DC it tomorrow as he stabilizes.     Mertie Moores, MD  12/14/2016 4:57 PM    [place],  [place], [place]  [address] Pager [phone] Phone: [phone]; Fax: [phone]

## 2016-12-14 NOTE — Progress Notes (Signed)
Pt in and out of torsades and SR with PVCs. Meds given as well at 16 shocks. Dr. Nelda Marseille and Dr. Caryl Comes notified Pt's wife [name] at bedside. See code sheet.

## 2016-12-14 NOTE — Progress Notes (Signed)
[place] Progress Note Patient Name: Nicholas Caldwell DOB: 04/14/1946 MRN: [id number]   Date of Service  12/14/2016  HPI/Events of Note  Notified patient's tracheal aspirate has multiple organisms as well as squamous epithelial cells. Patient underwent multiple cardioversions for torsades yesterday. Currently on mechanical ventilation. Possibly in a state of sepsis and previously on vancomycin and cefepime. Reviewed rounding physician from today which documents continuation of vancomycin and cefepime despite antibiotics are not currently ordered.   eICU Interventions  Reordering vancomycin and cefepime per pharmacy consult.      Intervention Category Major Interventions: Sepsis - evaluation and management  Tera Partridge 12/14/2016, 3:38 PM

## 2016-12-14 NOTE — Progress Notes (Signed)
Pt's HR in and out of torsades and SR with PVCs, Richardson Landry Minor NP and Dr. Nelda Marseille at bedside. Meds given as ordered and pt shocked twice. See code sheet.

## 2016-12-14 NOTE — Progress Notes (Signed)
[place] Progress Note Patient Name: Nicholas Caldwell DOB: Mar 04, 1946 MRN: [id number]   Date of Service  12/14/2016  HPI/Events of Note  Notified patient currently in what appears to be atrial fibrillation. Blood pressure holding. Labs reviewed from this morning showing potassium 3.9 & magnesium 2.5. Case discussed with Dr. Jens Som with cardiology/electrophysiology. Dr. Jens Som recommending cardioversion.   eICU Interventions  1. Ordering Versed IV as needed for sedation 2. Intensivist to assist with cardioversion at bedside      Intervention Category Major Interventions: Arrhythmia - evaluation and management  Tera Partridge 12/14/2016, 4:01 PM

## 2016-12-14 NOTE — Procedures (Signed)
Synchronized Cardioversion Procedure Note  Patient in a-fib with RVR with hypotension.  Patient given etomidate 40 mg total at 4 different intervals throughout the procedure.  Cardioverted x3 to NS then developed SVT and cardioverted again x2 to atrial bigeminy then subsequently NSR.  Rush Farmer, M.D. [facility] Pulmonary/Critical Care Medicine. Pager: [phone]. After hours pager: [phone].

## 2016-12-14 NOTE — Progress Notes (Signed)
Called by eLink, patient is in unstable a-fib with RVR, spoke with Dr. Jens Som over the phone, recommended continue lidocaine and isobril.  Recommended cardioversion.  See note.  Then spoke with Dr. Jasper Riling as I felt patient should be evaluated bedside given the number of antiarrhythmic drugs he is on.  Dr. Jasper Riling cam in and adjusted medications.   The patient is critically ill with multiple organ systems failure and requires high complexity decision making for assessment and support, frequent evaluation and titration of therapies, application of advanced monitoring technologies and extensive interpretation of multiple databases.   Critical Care Time devoted to patient care services described in this note is  87  Minutes. This time reflects time of care of this signee Dr Jennet Maduro. This critical care time does not reflect procedure time, or teaching time or supervisory time of PA/NP/Med student/Med Resident etc but could involve care discussion time.  Rush Farmer, M.D. [facility] Pulmonary/Critical Care Medicine. Pager: [phone]. After hours pager: [phone].

## 2016-12-14 NOTE — Progress Notes (Signed)
Patient Name: Nicholas Caldwell      SUBJECTIVE: intubated and sedated. But more awake  I saw the patient yesterday afternoon about 3:30 again. There is no documentation of this visit. Patient had had recurrent torsade. He was treated with magnesium supplementation, up titration of his lidocaine, the initiation of a paternal to increase his resting heart rate. We were able to increase his rate since the 80s. No further polymorphic ventricular tachycardia has been seen since yesterday afternoo   Past Medical History:  Diagnosis Date  . Atrial flutter ([place])    a. recurrent AFlutter with RVR;  b. Amiodarone Rx started 4/16  . CAD (coronary artery disease)    a. LHC 1/16:  mLAD diffuse disease, pLCx mild disease, dLCx with disease but too small for PCI, RCA ok, EF 25-30%  . Chronic pain   . Chronic systolic CHF (congestive heart failure) ([place])   . COPD (chronic obstructive pulmonary disease) ([place])   . Diabetes mellitus without complication ([place])   . Hypercholesteremia   . Hypertension   . NICM (nonischemic cardiomyopathy) ([place])    a.dx 2016. b. 2D echo 06/2016 - Last echo 07/01/16: mod dilated LV, mod LVH, EF 25-30%, mild-mod MR, sev LAE, mild-mod reduced RV systolic function, mild-mod TR, PASP 64mHG.  .Marland KitchenPAF (paroxysmal atrial fibrillation) (HCC)    On amio - ot a candidate for flecainide due to cardiomyopathy, not a candidate for Tikosyn due to prolonged QT, and felt to be a poor candidate for ablation given left atrial size.  . Prolonged Q-T interval on ECG   . Pulmonary hypertension   . Tobacco abuse     Scheduled Meds:  Scheduled Meds: . chlorhexidine gluconate (MEDLINE KIT)  15 mL Mouth Rinse BID  . insulin aspart  0-15 Units Subcutaneous Q4H  . mouth rinse  15 mL Mouth Rinse QID  . pantoprazole sodium  40 mg Per Tube Daily  . rivaroxaban  20 mg Oral Q supper  . rosuvastatin  20 mg Oral Daily   Continuous Infusions: . fentaNYL infusion INTRAVENOUS 400 mcg/hr (12/14/16  0700)  . isoproterenol (ISUPREL) infusion 1.533 mcg/min (12/14/16 0700)  . lidocaine 3 mg/min (12/14/16 0700)  . midazolam (VERSED) infusion 15 mg/hr (12/14/16 0700)   sodium chloride, fentaNYL, metoprolol, midazolam    PHYSICAL EXAM Vitals:   12/14/16 0700 12/14/16 0800 12/14/16 0806 12/14/16 0818  BP: 139/67 135/86    Pulse: 82 91    Resp: (!) 24 (!) 21    Temp:   98 F (36.7 C)   TempSrc:   Oral   SpO2: 100% 100%  100%  Weight:      Height:         Well developed and nourished intubated HENT normal Neck supple   Carotids brisk and full without bruits Clear Regular rate and rhythm, no murmurs or gallopsy No Clubbing cyanosis edema Skin-warm and dry Awake    Grossly normal sensory and motor function   TELEMETRY: Reviewed personnally pt in As above :  ECG personally reviewed  Sinus 82 13/10/54(65)    Intake/Output Summary (Last 24 hours) at 12/14/16 0855 Last data filed at 12/14/16 0800  Gross per 24 hour  Intake          3002.16 ml  Output             2430 ml  Net           572.16 ml    LABS: Basic Metabolic Panel:  Recent Labs Lab 12/13/16 0253 12/13/16 0309  12/13/16 0441 12/13/16 1353 12/13/16 1451 12/13/16 1957 12/14/16 0515  NA 142 144  --  140 143 146* 142 142  K 3.6 3.5  --  3.7 4.2 3.3* 3.8 3.9  CL 97* 100*  --  96* 101 96* 102 101  CO2 34*  --   --  31 31  --  30 29  GLUCOSE 318* 315*  --  357* 248* 242* 183* 186*  BUN <5* 5*  --  _0 CREATININE 0.76 0.70  --  0.74 0.91 0.80 1.00 0.91  CALCIUM 8.3*  --   --  8.6* 8.7*  --  8.4* 7.7*  MG  --   --   < > 2.1 2.9*  --  3.3* 2.5*  PHOS  --   --   < > 3.7 1.0*  --  <1.0* 2.4*  < > = values in this interval not displayed. Cardiac Enzymes:  Recent Labs  12/13/16 0253 12/13/16 1353  TROPONINI 0.03* 0.06*   CBC:  Recent Labs Lab 12/13/16 0253 12/13/16 0309 12/13/16 1353 12/13/16 1451 12/13/16 1957 12/14/16 0515  WBC 8.0  --  7.0  --  6.5 8.8  NEUTROABS 4.9  --  5.2  --    --   --   HGB 12.5* 15.3 11.5* 12.9* 10.4* 9.6*  HCT 42.9 45.0 38.7* 38.0* 35.2* 32.1*  MCV 89.4  --  86.8  --  86.3 83.8  PLT 362  --  420*  --  397 410*   PROTIME: No results for input(s): LABPROT, INR in the last 72 hours. Liver Function Tests:  Recent Labs  12/13/16 0253  AST 37  ALT 17  ALKPHOS 124  BILITOT 0.6  PROT 7.4  ALBUMIN 2.9*   No results for input(s): LIPASE, AMYLASE in the last 72 hours. BNP: BNP (last 3 results)  Recent Labs  10/30/16 0443 10/30/16 0850 12/13/16 0253  BNP 773.2* 594.4* 555.1*       ASSESSMENT AND PLAN:  Active Problems:   Acute respiratory failure (HCC)   Torsades de pointes (HCC)  Pt with no further VT We'll check a lidocaine level today. (Thankfully we can get an answer back relatively soon) Empirically We will decrease it to 1 mg/m  We'll check an ECG looking for baseline QT interval and then gradually begin to decrease isoproterenol  For now continue to hold amiodarone  Talking with his wife he has seizures in the past and also abrupt onset and offset presyncope She will look into family history and we will have to look at old ECGs     Signed, Virl Axe MD  12/14/2016

## 2016-12-14 NOTE — Progress Notes (Signed)
Pt went into afib RVR 120-180 and Dr. Nelda Marseille notified. Cardioverted pt 4 times and drugs given per his orders. See code sheet. Pt's wife, [name], stepped out of room during this time and updated when she came back in.

## 2016-12-14 NOTE — Progress Notes (Signed)
PULMONARY / CRITICAL CARE MEDICINE   Name: Nicholas Caldwell MRN: [id number] DOB: 03/16/1946    ADMISSION DATE:  12/13/2016 CONSULTATION DATE:    REFERRING MD:  EDP  CHIEF COMPLAINT:  Shortness of breath  HISTORY OF PRESENT ILLNESS:   Mr. Nicholas Caldwell is a 51M with PMH significant for CAD, systolic and diastolic CHF (EF 72-17%), DMII, HLD, HTN, paroxysmal a fib/flutter, pHTN, COPD and tobacco abuse who presents from home via EMS with acute shortness of breath and flu like symptoms for the past few days. On arrival EMS noted sats in the 60s on RA, gave him bronchodilators, 178m solumedrol, 2g mag and put him on CPAP. Despite this he was markedly somnolent on arrival to the ED and was urgently intubated after ABG on BiPAP resulted with pH 7.151, pCO2 109, pO2 87, HCO3 36.5.  Per the notes he had also complained of chest pain, leg swelling and productive cough x 3 days.  Labs demonstrate normal WBC, diff pending, elevated lactate @ 2.49, renal fxn okay with Cr 0.7 / BUN 5. CXR with bilateral infiltrates c/w pulmonary edema, enlarged cardiac silhouette, ETT 5.1 above carina and probable L effusion.  Wife provides additional history that he has had increasing lower extremity edema, abdominal girth and shortness of breath over the last 3-5 days. He had subjective fever and cough productive of whitish-yellow phlegm. He was using 3L of O2 near-continuously. He had a few episodes of loose stool and urgency where he didn't make it to the bathroom in time. No chest pain. No palpitations. He was not using his inhalers more. He did try some OTC cough remedies.   SUBJECTIVE:  Post arrest on multiple drips  VITAL SIGNS: BP (!) 126/59   Pulse 75   Temp 98 F (36.7 C) (Oral)   Resp (!) 24   Ht 6' (1.829 m)   Wt 269 lb 2.9 oz (122.1 kg)   SpO2 100%   BMI 36.51 kg/m   HEMODYNAMICS:    VENTILATOR SETTINGS: Vent Mode: PRVC FiO2 (%):  [40 %] 40 % Set Rate:  [24 bmp] 24 bmp Vt Set:  [280 mL-580 mL] 580  mL PEEP:  [5 cmH20] 5 cmH20 Plateau Pressure:  [22 cmH20-27 cmH20] 27 cmH20  INTAKE / OUTPUT: I/O last 3 completed shifts: In: 3062.3 [I.V.:2390.3; Other:70; NG/GT:30; IV Piggyback:572] Out: 61165 [CKNKE:3275 Emesis/NG output:650]  PHYSICAL EXAMINATION:  General Well nourished, well developed, obese, intubated, sedated  HEENT No gross abnormalities. OETT / OGT in place   Pulmonary Coarse breath sounds bilaterally, improved with suctioning, occasional wheeze on anterior exam. Vent-assisted effort, symmetrical expansion.   Cardiovascular Irregularly irregular rhythm, rate 89. S1, s2. No m/r/g. Distal pulses palpable. On lido.isuprel drips  Abdomen Soft, non-tender, obese, positive bowel sounds, no palpable organomegaly or masses. Normoresonant to percussion.  Musculoskeletal Grossly normal  Lymphatics No cervical, supraclavicular or axillary adenopathy.   Neurologic Awake and alert  Skin/Integuement No rash, no cyanosis, no clubbing, 2+ edema bilateral lower extremities    LABS:  BMET  Recent Labs Lab 12/13/16 1353 12/13/16 1451 12/13/16 1957 12/14/16 0515  NA 143 146* 142 142  K 4.2 3.3* 3.8 3.9  CL 101 96* 102 101  CO2 31  --  30 29  BUN _0 CREATININE 0.91 0.80 1.00 0.91  GLUCOSE 248* 242* 183* 186*    Electrolytes  Recent Labs Lab 12/13/16 1353 12/13/16 1957 12/14/16 0515  CALCIUM 8.7* 8.4* 7.7*  MG 2.9* 3.3* 2.5*  PHOS 1.0* <1.0*  2.4*    CBC  Recent Labs Lab 12/13/16 1353 12/13/16 1451 12/13/16 1957 12/14/16 0515  WBC 7.0  --  6.5 8.8  HGB 11.5* 12.9* 10.4* 9.6*  HCT 38.7* 38.0* 35.2* 32.1*  PLT 420*  --  397 410*    Coag's No results for input(s): APTT, INR in the last 168 hours.  Sepsis Markers  Recent Labs Lab 12/13/16 0308 12/13/16 0338 12/13/16 0441  LATICACIDVEN 2.49* 2.8*  --   PROCALCITON  --   --  0.28    ABG  Recent Labs Lab 12/13/16 0304 12/13/16 0446 12/13/16 1453  PHART 7.151* 7.429 7.563*  PCO2ART 109*  50.6* 37.6  PO2ART 87.0 153.0* 80.0*    Liver Enzymes  Recent Labs Lab 12/13/16 0253  AST 37  ALT 17  ALKPHOS 124  BILITOT 0.6  ALBUMIN 2.9*    Cardiac Enzymes  Recent Labs Lab 12/13/16 0253 12/13/16 1353  TROPONINI 0.03* 0.06*    Glucose  Recent Labs Lab 12/13/16 1202 12/13/16 1639 12/13/16 2008 12/14/16 0014 12/14/16 0400 12/14/16 0804  GLUCAP 218* 213* 187* 200* 190* 179*    Imaging Dg Chest Port 1 View  Result Date: 12/14/2016 CLINICAL DATA:  Abnormal respirations. Hypertension. Chronic systolic heart failure. EXAM: PORTABLE CHEST 1 VIEW COMPARISON:  12/13/2016. FINDINGS: Cardiomegaly. BILATERAL pulmonary opacities favored to represent pulmonary edema appears slightly improved from yesterday's radiograph. ET tube remains 5.8 cm above carina. Enteric tube tip lies below the diaphragm. No osseous findings. IMPRESSION: Cardiomegaly with BILATERAL pulmonary opacities appears slightly improved, possible clearing edema. Electronically Signed   By: Staci Righter M.D.   On: 12/14/2016 07:32     STUDIES:  CXR as above  CULTURES: Flu swab pending Blood cx pending  ANTIBIOTICS: Vancomycin 2/10 >> Cefepime 2/10 >>  SIGNIFICANT EVENTS: Intubation in ED  LINES/TUBES: OETT 2/10 >> OGT 2/10 >> Foley 2/10 >>  DISCUSSION: Mr. Nicholas Caldwell is a 3M with PMH as above presenting with acute on chronic hypoxemic / hypercapneic respiratory failure in the setting of known systolic/diastolic dysfunction and COPD. Some concern for acute viral syndrome with symptom onset > 48h ago. Currently in a fib with RVR, but maintaining adequate BP. 2/10 torsades refractory to treatment  ASSESSMENT / PLAN:  PULMONARY A: Acute on chronic hypoxemic/hypercapneic respiratory failure 2/2 acute decompensated mixed CHF +/- viral syndrome Underlying COPD (no PFTs on file), without overt acute exacerbation Pulmonary HTN (RVSP 57 on last TTE) P:   Continue MV Wean as tolerated when  stable DuoNebs q6h  Albuterol q4h prn No steroids for now Diurese Check Flu Tracheal aspirate  CARDIOVASCULAR A:  A fib/flutter with RVR - on xarelto, dig, amio at home Acute decompensated CHF (EF 35-40%) Episode of torsades 2/10 with spontaneous resolution Prolonged qt interval  Refractory ventricular rhythm  P:  Diurese - lasix 158m given in ED(neg 4l) Continue digoxin Prn metoprolol iv for HR > 120; hold for SBP < 110 2 GM mag given Cards consult Lido/isuprel drips per cards  RENAL Lab Results  Component Value Date   CREATININE 0.91 12/14/2016   CREATININE 1.00 12/13/2016   CREATININE 0.80 12/13/2016   CREATININE 0.76 03/24/2016   CREATININE 0.79 10/01/2015   CREATININE 0.80 09/12/2015    Recent Labs Lab 12/13/16 1451 12/13/16 1957 12/14/16 0515  K 3.3* 3.8 3.9    Mag 2.5 A:   No acute issues - Cr 0.7  P:   Monitor renal function  GASTROINTESTINAL A:   Reported diarrhea P:   NPO Stress  ulcer ppx Monitor bowel fxn  HEMATOLOGIC  Recent Labs  12/13/16 1957 12/14/16 0515  HGB 10.4* 9.6*    A:   Mild anemia 12.5 Chronic a/c for a fib/flutter P:  Trend CBC Continue home xarelto  INFECTIOUS A:   Possible viral syndrome; doubt bacterial infxn P:   Follow cultures, flu panel  PCT 0.28 De-escalate abx as able  ENDOCRINE CBG (last 3)   Recent Labs  12/14/16 0014 12/14/16 0400 12/14/16 0804  GLUCAP 200* 190* 179*     A:   IDDM    P:   CBG  SSI  NEUROLOGIC A:   No acute issues, but exam limited by sedation 2/10 awake and alert post code 2/11 heavy sedation till cards issue controlled P:   RASS goal: 0 to -1 Heavy sedation for now  FAMILY  - Updates: Wife at bedside 2/11. Per our discussion and her prior discussion with the patient, he would desire to be FULL CODE.  - Inter-disciplinary family meet or Palliative Care meeting due by:  day 7  App cct 30 min  Richardson Landry Minor ACNP Maryanna Shape PCCM Pager [phone] till 3  pm If no answer page [phone]  Attending Note:  71 year old male with extensive cardiac history who presents to PCCM intubated after acute pulmonary edema and frequent episodes of torsades de pointes.  On exam, he is arousable even on 15 mg/hr versed and 400 mcg/hr fentanyl and following commands.  Lungs are clear to auscultation.  I reviewed CXR myself, improving infiltrate bilaterally.  Discussed with PCCM-NP, will begin lasix to clear pulmonary edema.  Begin PS trials today.  D/C versed drip and titrate fentanyl drip down.  Versed to PRN.  No need for such high sedatives.  If able to urinate more and more awake with weaning in AM then will consider extubation in AM.  The patient is critically ill with multiple organ systems failure and requires high complexity decision making for assessment and support, frequent evaluation and titration of therapies, application of advanced monitoring technologies and extensive interpretation of multiple databases.   Critical Care Time devoted to patient care services described in this note is  35  Minutes. This time reflects time of care of this signee Dr Jennet Maduro. This critical care time does not reflect procedure time, or teaching time or supervisory time of PA/NP/Med student/Med Resident etc but could involve care discussion time.  Rush Farmer, M.D. [facility] Pulmonary/Critical Care Medicine. Pager: [phone]. After hours pager: [phone].  12/14/2016, 11:12 AM

## 2016-12-14 NOTE — Progress Notes (Signed)
Pharmacy Antibiotic Note  Nicholas Caldwell is a 71 y.o. male admitted on 12/13/2016 with sepsis.  Pharmacy has been consulted for vancomycin and zosyn dosing. Pt is afebrile and WBC is WNL. SCr is WNL. All cultures are negative so far.   Plan: Vanc 1gm IV Q12H Cefepime 1gm IV Q8H F/u renal fxn, C&S, clinical status and trough at SS  Height: 6' (182.9 cm) Weight: 269 lb 2.9 oz (122.1 kg) IBW/kg (Calculated) : 77.6  Temp (24hrs), Avg:99 F (37.2 C), Min:98 F (36.7 C), Max:99.7 F (37.6 C)   Recent Labs Lab 12/13/16 0253 12/13/16 0308  12/13/16 0338 12/13/16 0441 12/13/16 1353 12/13/16 1451 12/13/16 1957 12/14/16 0515  WBC 8.0  --   --   --   --  7.0  --  6.5 8.8  CREATININE 0.76  --   < >  --  0.74 0.91 0.80 1.00 0.91  LATICACIDVEN  --  2.49*  --  2.8*  --   --   --   --   --   < > = values in this interval not displayed.  Estimated Creatinine Clearance: 104.8 mL/min (by C-G formula based on SCr of 0.91 mg/dL).    No Known Allergies  Cefepime x 1 2/10; 2/11>> Vanc x 1 2/10; 2/11>>  2/10 MRSA - NEG 2/10 TA - NGTD  Thank you for allowing pharmacy to be a part of this patient's care.  Alleyah Twombly, Rande Lawman 12/14/2016 3:41 PM

## 2016-12-15 ENCOUNTER — Inpatient Hospital Stay (HOSPITAL_COMMUNITY): Payer: Medicare Other

## 2016-12-15 LAB — BLOOD GAS, ARTERIAL
Acid-Base Excess: 7.6 mmol/L — ABNORMAL HIGH (ref 0.0–2.0)
Bicarbonate: 31.2 mmol/L — ABNORMAL HIGH (ref 20.0–28.0)
Drawn by: 283401
FIO2: 40
O2 Saturation: 99.4 %
PEEP: 5 cmH2O
Patient temperature: 98.6
RATE: 24 resp/min
VT: 580 mL
pCO2 arterial: 41 mmHg (ref 32.0–48.0)
pH, Arterial: 7.494 — ABNORMAL HIGH (ref 7.350–7.450)
pO2, Arterial: 240 mmHg — ABNORMAL HIGH (ref 83.0–108.0)

## 2016-12-15 LAB — BASIC METABOLIC PANEL
ANION GAP: 16 — AB (ref 5–15)
Anion gap: 10 (ref 5–15)
BUN: 8 mg/dL (ref 6–20)
BUN: 11 mg/dL (ref 6–20)
CALCIUM: 8.6 mg/dL — AB (ref 8.9–10.3)
CO2: 30 mmol/L (ref 22–32)
CO2: 27 mmol/L (ref 22–32)
CREATININE: 1.21 mg/dL (ref 0.61–1.24)
Calcium: 8.3 mg/dL — ABNORMAL LOW (ref 8.9–10.3)
Chloride: 102 mmol/L (ref 101–111)
Chloride: 99 mmol/L — ABNORMAL LOW (ref 101–111)
Creatinine, Ser: 1.13 mg/dL (ref 0.61–1.24)
GFR, EST NON AFRICAN AMERICAN: 60 mL/min — AB (ref >60)
GLUCOSE: 157 mg/dL — AB (ref 65–99)
Glucose, Bld: 115 mg/dL — ABNORMAL HIGH (ref 65–99)
Potassium: 3.9 mmol/L (ref 3.5–5.1)
Potassium: 3.6 mmol/L (ref 3.5–5.1)
SODIUM: 142 mmol/L (ref 135–145)
Sodium: 142 mmol/L (ref 135–145)

## 2016-12-15 LAB — GLUCOSE, CAPILLARY
GLUCOSE-CAPILLARY: 109 mg/dL — AB (ref 65–99)
GLUCOSE-CAPILLARY: 122 mg/dL — AB (ref 65–99)
GLUCOSE-CAPILLARY: 154 mg/dL — AB (ref 65–99)
Glucose-Capillary: 118 mg/dL — ABNORMAL HIGH (ref 65–99)
Glucose-Capillary: 123 mg/dL — ABNORMAL HIGH (ref 65–99)
Glucose-Capillary: 184 mg/dL — ABNORMAL HIGH (ref 65–99)

## 2016-12-15 LAB — CBC
HEMATOCRIT: 32.4 % — AB (ref 39.0–52.0)
Hemoglobin: 10 g/dL — ABNORMAL LOW (ref 13.0–17.0)
MCH: 25.1 pg — ABNORMAL LOW (ref 26.0–34.0)
MCHC: 30.9 g/dL (ref 30.0–36.0)
MCV: 81.4 fL (ref 78.0–100.0)
PLATELETS: 408 10*3/uL — AB (ref 150–400)
RBC: 3.98 MIL/uL — AB (ref 4.22–5.81)
RDW: 16.6 % — AB (ref 11.5–15.5)
WBC: 9.9 10*3/uL (ref 4.0–10.5)

## 2016-12-15 LAB — PHOSPHORUS
Phosphorus: 4.1 mg/dL (ref 2.5–4.6)
Phosphorus: 4.8 mg/dL — ABNORMAL HIGH (ref 2.5–4.6)

## 2016-12-15 LAB — MAGNESIUM
MAGNESIUM: 2.5 mg/dL — AB (ref 1.7–2.4)
MAGNESIUM: 2.5 mg/dL — AB (ref 1.7–2.4)

## 2016-12-15 MED ORDER — DILTIAZEM HCL 25 MG/5ML IV SOLN
10.0000 mg | Freq: Once | INTRAVENOUS | Status: AC
  Administered 2016-12-15: 10 mg via INTRAVENOUS
  Filled 2016-12-15: qty 5

## 2016-12-15 MED ORDER — VITAL HIGH PROTEIN PO LIQD
1000.0000 mL | ORAL | Status: DC
  Administered 2016-12-16: 10:00:00

## 2016-12-15 MED ORDER — POTASSIUM CHLORIDE 20 MEQ/15ML (10%) PO SOLN
40.0000 meq | Freq: Once | ORAL | Status: AC
  Administered 2016-12-15: 40 meq via ORAL
  Filled 2016-12-15: qty 30

## 2016-12-15 MED ORDER — PRO-STAT SUGAR FREE PO LIQD
30.0000 mL | Freq: Four times a day (QID) | ORAL | Status: DC
  Administered 2016-12-15 – 2016-12-16 (×4): 30 mL
  Filled 2016-12-15 (×4): qty 30

## 2016-12-15 MED ORDER — FUROSEMIDE 10 MG/ML IJ SOLN
80.0000 mg | Freq: Three times a day (TID) | INTRAMUSCULAR | Status: AC
  Administered 2016-12-15 – 2016-12-16 (×3): 80 mg via INTRAVENOUS
  Filled 2016-12-15 (×4): qty 8

## 2016-12-15 MED ORDER — SODIUM CHLORIDE 0.9 % IV SOLN
30.0000 meq | Freq: Once | INTRAVENOUS | Status: AC
  Administered 2016-12-15: 30 meq via INTRAVENOUS
  Filled 2016-12-15: qty 15

## 2016-12-15 MED ORDER — VITAL HIGH PROTEIN PO LIQD
1000.0000 mL | ORAL | Status: DC
  Administered 2016-12-15: 1000 mL

## 2016-12-15 MED ORDER — MAGNESIUM SULFATE 2 GM/50ML IV SOLN
2.0000 g | Freq: Once | INTRAVENOUS | Status: AC
  Administered 2016-12-15: 2 g via INTRAVENOUS
  Filled 2016-12-15: qty 50

## 2016-12-15 MED ORDER — POTASSIUM CHLORIDE CRYS ER 20 MEQ PO TBCR
40.0000 meq | EXTENDED_RELEASE_TABLET | Freq: Two times a day (BID) | ORAL | Status: DC
  Administered 2016-12-15: 40 meq via ORAL
  Filled 2016-12-15 (×2): qty 2

## 2016-12-15 MED ORDER — PRO-STAT SUGAR FREE PO LIQD
30.0000 mL | Freq: Two times a day (BID) | ORAL | Status: DC
  Administered 2016-12-15: 30 mL
  Filled 2016-12-15: qty 30

## 2016-12-15 MED ORDER — DILTIAZEM HCL 100 MG IV SOLR
5.0000 mg/h | INTRAVENOUS | Status: DC
  Administered 2016-12-15: 5 mg/h via INTRAVENOUS
  Filled 2016-12-15 (×2): qty 100

## 2016-12-15 NOTE — Progress Notes (Signed)
Patient Name: Nicholas Caldwell      SUBJECTIVE:intubated denies pain  EF  35% 2018 with severe LAE; CAth 1/16 no CAD  Past Medical History:  Diagnosis Date  . Atrial flutter ([place])    a. recurrent AFlutter with RVR;  b. Amiodarone Rx started 4/16  . CAD (coronary artery disease)    a. LHC 1/16:  mLAD diffuse disease, pLCx mild disease, dLCx with disease but too small for PCI, RCA ok, EF 25-30%  . Chronic pain   . Chronic systolic CHF (congestive heart failure) ([place])   . COPD (chronic obstructive pulmonary disease) ([place])   . Diabetes mellitus without complication ([place])   . Hypercholesteremia   . Hypertension   . NICM (nonischemic cardiomyopathy) ([place])    a.dx 2016. b. 2D echo 06/2016 - Last echo 07/01/16: mod dilated LV, mod LVH, EF 25-30%, mild-mod MR, sev LAE, mild-mod reduced RV systolic function, mild-mod TR, PASP 20mHG.  .Marland KitchenPAF (paroxysmal atrial fibrillation) (HCC)    On amio - ot a candidate for flecainide due to cardiomyopathy, not a candidate for Tikosyn due to prolonged QT, and felt to be a poor candidate for ablation given left atrial size.  . Prolonged Q-T interval on ECG   . Pulmonary hypertension   . Tobacco abuse     Scheduled Meds:  Scheduled Meds: . ceFEPime (MAXIPIME) IV  1 g Intravenous Q8H  . chlorhexidine gluconate (MEDLINE KIT)  15 mL Mouth Rinse BID  . insulin aspart  0-15 Units Subcutaneous Q4H  . mouth rinse  15 mL Mouth Rinse QID  . pantoprazole sodium  40 mg Per Tube Daily  . rivaroxaban  20 mg Oral Q supper  . rosuvastatin  20 mg Oral Daily  . vancomycin  1,000 mg Intravenous Q12H   Continuous Infusions: . fentaNYL infusion INTRAVENOUS 200 mcg/hr (12/15/16 0800)  . isoproterenol (ISUPREL) infusion Stopped (12/15/16 0032)  . lidocaine 1 mg/min (12/15/16 0800)   sodium chloride, fentaNYL, metoprolol, midazolam    PHYSICAL EXAM Vitals:   12/15/16 0545 12/15/16 0600 12/15/16 0700 12/15/16 0800  BP:  135/65 136/78 (!) 153/76  Pulse: (!)  58 (!) 59 62 66  Resp: (!) 24 (!) 24 (!) 24 (!) 24  Temp:    98.8 F (37.1 C)  TempSrc:    Oral  SpO2: 100% 100% 100% 100%  Weight:      Height:       Well developed and nourished intubated  Neck suppl  Clear Regular rate and rhythm, no murmurs or gallops Abd-soft with active BS No Clubbing cyanosis 3+edema Skin-warm and dry A & Oriented  Grossly normal sensory and motor function  TELEMETRY: Reviewed personnally pt in  NSR No furhter VT Afib last PM and isuprel weaned:  ECG personally reviewed  Not yet available    Intake/Output Summary (Last 24 hours) at 12/15/16 0838 Last data filed at 12/15/16 0800  Gross per 24 hour  Intake          2816.22 ml  Output             3840 ml  Net         -1023.78 ml    LABS: Basic Metabolic Panel:  Recent Labs Lab 12/13/16 0253 12/13/16 0309  12/13/16 0441 12/13/16 1353 12/13/16 1451 12/13/16 1957 12/14/16 0515 12/14/16 1853 12/15/16 0157  NA 142 144  --  140 143 146* 142 142  --  142  K 3.6 3.5  --  3.7 4.2 3.3* 3.8 3.9 4.4 3.9  CL 97* 100*  --  96* 101 96* 102 101  --  102  CO2 34*  --   --  31 31  --  30 29  --  30  GLUCOSE 318* 315*  --  357* 248* 242* 183* 186*  --  115*  BUN <5* 5*  --  _0 --  8  CREATININE 0.76 0.70  --  0.74 0.91 0.80 1.00 0.91  --  1.21  CALCIUM 8.3*  --   --  8.6* 8.7*  --  8.4* 7.7*  --  8.3*  MG  --   --   < > 2.1 2.9*  --  3.3* 2.5*  --  2.5*  PHOS  --   --   < > 3.7 1.0*  --  <1.0* 2.4*  --  4.1  < > = values in this interval not displayed. Cardiac Enzymes:  Recent Labs  12/13/16 0253 12/13/16 1353  TROPONINI 0.03* 0.06*   CBC:  Recent Labs Lab 12/13/16 0253 12/13/16 0309 12/13/16 1353 12/13/16 1451 12/13/16 1957 12/14/16 0515 12/15/16 0157  WBC 8.0  --  7.0  --  6.5 8.8 9.9  NEUTROABS 4.9  --  5.2  --   --   --   --   HGB 12.5* 15.3 11.5* 12.9* 10.4* 9.6* 10.0*  HCT 42.9 45.0 38.7* 38.0* 35.2* 32.1* 32.4*  MCV 89.4  --  86.8  --  86.3 83.8 81.4  PLT 362  --   420*  --  397 410* 408*   PROTIME: No results for input(s): LABPROT, INR in the last 72 hours. Liver Function Tests:  Recent Labs  12/13/16 0253  AST 37  ALT 17  ALKPHOS 124  BILITOT 0.6  PROT 7.4  ALBUMIN 2.9*   No results for input(s): LIPASE, AMYLASE in the last 72 hours. BNP: BNP (last 3 results)    ASSESSMENT AND PLAN:  Active Problems:   Acute respiratory failure (HCC)   Torsades de pointes (HCC)   Acute respiratory failure with hypoxia and hypercapnia (HCC)   Acute pulmonary edema (HCC)   Acute encephalopathy   Atrial fibrillation with RVR (HCC)   Cardiogenic shock (HCC)   SVT (supraventricular tachycardia) (HCC)  Pts AFib may be 2/2 weaning stress aggravated by isoproterenol which was being used to shorten QT by raising HR  Now off ECG is pending  Needs diuresis  Furosemide 40q 6>---->80 q8  Will have to be careful re K  Lido level  Try and stop tomorrow This pt may have LQTS based on interview with wife When he is better please let me know as would like to explore that with pt; his wife was going to talk with his sisters   Signed, Virl Axe MD  12/15/2016

## 2016-12-15 NOTE — Progress Notes (Addendum)
15 mls versed wasted and witnessed per Allegra Grana RN and one unused bag returned to main pharmacy

## 2016-12-15 NOTE — Progress Notes (Signed)
Patient back in atrial fibrillation with rate 130 .Cardizem 10 mg bolus followed by a cardizem infusion per Dr Nelda Marseille

## 2016-12-15 NOTE — Progress Notes (Signed)
RT note-decrease rate per Dr. Nelda Marseille.

## 2016-12-15 NOTE — Progress Notes (Signed)
[facility] Progress Note Patient Name: Kijuan Gallicchio DOB: May 04, 1946 MRN: [id number]   Date of Service  12/15/2016  HPI/Events of Note    eICU Interventions  Hypokalemia -repleted      Intervention Category Intermediate Interventions: Electrolyte abnormality - evaluation and management  Zacherie Honeyman V. 12/15/2016, 3:47 PM

## 2016-12-15 NOTE — Progress Notes (Signed)
SBT trial done 5/5, 40%. Pt did not tol well due to pt HR went in Aflutter increased HR. RT placed pt back on full support with same settings.

## 2016-12-15 NOTE — Progress Notes (Signed)
Nutrition Consult / Follow-up  DOCUMENTATION CODES:   Obesity unspecified  INTERVENTION:    Vital High Protein at 50 ml/h (1200 ml per day)  Pro-stat 30 ml QID  Provides 1600 kcal, 165 gm protein, 1003 ml free water daily  NUTRITION DIAGNOSIS:   Inadequate oral intake related to inability to eat as evidenced by NPO status.  Ongoing  GOAL:   Provide needs based on ASPEN/SCCM guidelines  Unmet  MONITOR:   TF tolerance, Vent status, Labs, I & O's  REASON FOR ASSESSMENT:   Consult Enteral/tube feeding initiation and management  ASSESSMENT:   71 y/o male PMHx CAD, CHF, DM2, HLD, HTN, AFIB, htn, COPD, tobacco abuse. Presents from home with SOB and flu like symptoms for past few days. Urgently intubated on arrival to the ED due to ABG values. Admitted for Acute on chronic resp failure secondary to decompensated HF and +/- viral syndrome.   Discussed patient in ICU rounds and with RN today. Received MD Consult for TF initiation and management. Nutrition-Focused physical exam completed. Findings are no fat depletion, no muscle depletion, and severe edema.  Patient is currently intubated on ventilator support Temp (24hrs), Avg:99.1 F (37.3 C), Min:98.5 F (36.9 C), Max:99.8 F (37.7 C)  Labs reviewed: magnesium 2.5 Medications reviewed and include Lasix, KCl.  Diet Order:  Diet NPO time specified  Skin:  Wound (see comment) (diabetic ulcer L leg)  Last BM:  PTA  Height:   Ht Readings from Last 1 Encounters:  12/13/16 6' (1.829 m)    Weight:   Wt Readings from Last 1 Encounters:  12/15/16 271 lb 6.2 oz (123.1 kg)    Ideal Body Weight:  80.91 kg  BMI:  Body mass index is 36.81 kg/m.  Estimated Nutritional Needs:   Kcal:  1370-1740 kcals (11-14 kcal/kg bw)  Protein:  >162 g Pro (2 g/kg IBW)  Fluid:  Per MD  EDUCATION NEEDS:   No education needs identified at this time  Molli Barrows, [place], [place], [place] Pager [phone] After Hours Pager  [phone]

## 2016-12-15 NOTE — Progress Notes (Signed)
[facility] Progress Note Patient Name: Nicholas Caldwell DOB: [birth year] MRN: [id number]   Date of Service  12/15/2016  HPI/Events of Note    eICU Interventions  Wrist restraints placed for patient safety     Intervention Category Intermediate Interventions: Other:  Meili Kleckley S. 12/15/2016, 2:00 AM

## 2016-12-15 NOTE — Progress Notes (Signed)
PULMONARY / CRITICAL CARE MEDICINE   Name: Nicholas Caldwell MRN: [id number] DOB: Jun 13, 1946    ADMISSION DATE:  12/13/2016 CONSULTATION DATE:    REFERRING MD:  EDP  CHIEF COMPLAINT:  Shortness of breath  BRIEF SUMMARY :   71M with PMH significant for CAD, systolic and diastolic CHF (EF 15-52%), DMII, HLD, HTN, paroxysmal a fib/flutter, pHTN, COPD and tobacco abuse admitted 2/10 with complaints of SOB and flu-like symptoms.  Work up consistent with hypoxic / hypercarbic respiratory failure.  Failed bipap with ABG of 7.15 / 109 / 87.    SUBJECTIVE:  RN reports pt weaned yesterday but developed AF requiring cardioversion x3 > SVT with cardioversion x2.  Attempted wean 2/12 am and went back into a-flutter.    VITAL SIGNS: BP (!) 154/104   Pulse (!) 128   Temp 98.8 F (37.1 C) (Oral)   Resp (!) 23 Comment: Simultaneous filing. User may not have seen previous data.  Ht 6' (1.829 m)   Wt 271 lb 6.2 oz (123.1 kg)   SpO2 100%   BMI 36.81 kg/m   HEMODYNAMICS:    VENTILATOR SETTINGS: Vent Mode: PRVC FiO2 (%):  [40 %] 40 % Set Rate:  [24 bmp] 24 bmp Vt Set:  [580 mL] 580 mL PEEP:  [5 cmH20] 5 cmH20 Pressure Support:  [5 cmH20-12 cmH20] 5 cmH20 Plateau Pressure:  [24 cmH20-33 cmH20] 28 cmH20  INTAKE / OUTPUT: I/O last 3 completed shifts: In: 5316.5 [I.V.:3766.5; NG/GT:30; IV Piggyback:1520] Out: 4900 [Urine:3505; Emesis/NG output:1395]  PHYSICAL EXAMINATION: General:  Adult male in NAD HEENT: MM pink/moist, ETT, NCAT PSY: unable to assess  Neuro:  Awake, alert, follow commands appropriately  CV: s1s2 rrr, no m/r/g PULM: even/non-labored, vent assisted breath sounds bilaterally  CE:YEMV, non-tender, bsx4 active  Extremities: warm/dry, 2-3+ pitting edema, weeping Skin: no rashes or lesions  LABS:  BMET  Recent Labs Lab 12/13/16 1957 12/14/16 0515 12/14/16 1853 12/15/16 0157  NA 142 142  --  142  K 3.8 3.9 4.4 3.9  CL 102 101  --  102  CO2 30 29  --  30  BUN 8 8  --   8  CREATININE 1.00 0.91  --  1.21  GLUCOSE 183* 186*  --  115*    Electrolytes  Recent Labs Lab 12/13/16 1957 12/14/16 0515 12/15/16 0157  CALCIUM 8.4* 7.7* 8.3*  MG 3.3* 2.5* 2.5*  PHOS <1.0* 2.4* 4.1    CBC  Recent Labs Lab 12/13/16 1957 12/14/16 0515 12/15/16 0157  WBC 6.5 8.8 9.9  HGB 10.4* 9.6* 10.0*  HCT 35.2* 32.1* 32.4*  PLT 397 410* 408*    Coag's No results for input(s): APTT, INR in the last 168 hours.  Sepsis Markers  Recent Labs Lab 12/13/16 0308 12/13/16 0338 12/13/16 0441  LATICACIDVEN 2.49* 2.8*  --   PROCALCITON  --   --  0.28    ABG  Recent Labs Lab 12/13/16 0446 12/13/16 1453 12/15/16 0325  PHART 7.429 7.563* 7.494*  PCO2ART 50.6* 37.6 41.0  PO2ART 153.0* 80.0* 240*    Liver Enzymes  Recent Labs Lab 12/13/16 0253  AST 37  ALT 17  ALKPHOS 124  BILITOT 0.6  ALBUMIN 2.9*    Cardiac Enzymes  Recent Labs Lab 12/13/16 0253 12/13/16 1353  TROPONINI 0.03* 0.06*    Glucose  Recent Labs Lab 12/14/16 1130 12/14/16 1547 12/14/16 1934 12/15/16 0007 12/15/16 0417 12/15/16 0757  GLUCAP 153* 132* 174* 123* 109* 122*    Imaging Dg Chest  Port 1 View  Result Date: 12/15/2016 CLINICAL DATA:  Endotracheal tube position EXAM: PORTABLE CHEST 1 VIEW COMPARISON:  12/14/2016 FINDINGS: Endotracheal tube in good position.  NG tube in the stomach. Diffuse bilateral airspace disease without significant change. Bibasilar atelectasis. Small pleural effusions bilaterally. IMPRESSION: Endotracheal tube remains in good position Diffuse bilateral airspace disease unchanged, probable edema versus pneumonia. Bibasilar atelectasis and small effusions unchanged. Electronically Signed   By: Franchot Gallo M.D.   On: 12/15/2016 06:45   STUDIES:  ECHO 2/10 >> mild LVH, mild reduction in systolic function, LVEF 68-25%, diffuse hypokinesis, LA moderately dilated, PA peak 56 mmHg  CULTURES: Influenza 2/10 >> negative BCx2 2/10 >>  Tracheal  Aspirate 2/10 >>   ANTIBIOTICS: Vancomycin 2/10 >> 2/12 Cefepime 2/10 >>  SIGNIFICANT EVENTS: 2/10  Admit hypoxic/hypercarbic resp fx, failed bipap > intubated  2/11  Weaned on PSV then developed AF requiring cardioversion   LINES/TUBES: OETT 2/10 >> Foley 2/10 >>  DISCUSSION: [name] is a 71M admitted 2/10 with acute on chronic hypoxemic / hypercapneic respiratory failure in the setting of known systolic/diastolic dysfunction and COPD. Some concern for acute viral syndrome with symptom onset > 48h ago. Currently in a fib with RVR, but maintaining adequate BP.  2/10 while on PSV, developed torsades refractory to treatment requiring multiple cardioversion / medications.    ASSESSMENT / PLAN:  PULMONARY A: Acute on chronic hypoxemic/hypercapneic respiratory failure 2/2 acute decompensated mixed CHF +/- viral syndrome Underlying COPD (no PFTs on file), without overt acute exacerbation Pulmonary HTN (RVSP 57 on last TTE) P:   PRVC 8 cc/kg Reduce rate to 16 PSV as tolerated  Diuresis per Cardiology  Intermittent CXR  Follow cultures  CARDIOVASCULAR A:  A fib/flutter with RVR - on xarelto, dig, amio at home Acute decompensated CHF (EF 35-40%) Episode of torsades 2/10 with spontaneous resolution Prolonged qt interval  Refractory ventricular rhythm  P:  Diuresis per Cardiology  ICU monitoring  PRN lopressor for HR >130  Defer isuprel/cardizem to Cardiology  Keep K >4, Mg >2 Continue crestor   RENAL A:   AKI Hypokalemia  Hypophosphatemia  P:   Trend BMP / UOP  Replace electrolytes as above  Avoid nephrotoxic agents as able, d/c vanco 2/12  GASTROINTESTINAL A:   Diarrhea - prior to admit, none since admit P:   NPO  Begin TF 2/12 PPI  HEMATOLOGIC A:   Anemia - mild, no acute bleeding Chronic a/c for a fib/flutter P:  Continue home xarelto  Trend CBC   INFECTIOUS A:   Possible viral syndrome; doubt bacterial infxn P:   Follow cultures as above Flu  negative  D/c vanco  D3/x abx  ENDOCRINE A:   IDDM   P:   SSI with CBG  NEUROLOGIC A:   ICU associated pain / discomfort  P:   RASS goal: 0 to -1  Monitor neuro exam Fentanyl gtt for pain PRN versed D/C restraints if able  FAMILY  - Updates:  Wife and patient updated on plan of care 2/12.    - Inter-disciplinary family meet or Palliative Care meeting due by:  2/17  NP CC Time: 69 minutes  Noe Gens, NP-C [facility] Pgr: [phone] or if no answer [phone] 12/15/2016, 10:04 AM  Attending Note:  71 year old male with severe arrhythmias requiring multiple cardioversions over the weekend.  During weaning the patient's HR increased and went back in a-fib with RVR.  On exam, lungs are clear.  I reviewed CXR myself, ETT ok and pulmonary edema noted.  Will place back on wean and if HR increases then will start a dilt drip.  Continue diureses. KVO IVF.  Wife updated bedside, will not extubate today.  The patient is critically ill with multiple organ systems failure and requires high complexity decision making for assessment and support, frequent evaluation and titration of therapies, application of advanced monitoring technologies and extensive interpretation of multiple databases.   Critical Care Time devoted to patient care services described in this note is  35  Minutes. This time reflects time of care of this signee Dr Jennet Maduro. This critical care time does not reflect procedure time, or teaching time or supervisory time of PA/NP/Med student/Med Resident etc but could involve care discussion time.  Rush Farmer, M.D. [facility] Pulmonary/Critical Care Medicine. Pager: [phone]. After hours pager: [phone].

## 2016-12-15 NOTE — Progress Notes (Signed)
RT note: patient placed on wean by Dr. Nelda Marseille patient went into flutter medication delivered by nurse, placed back on full support. Will reassess extubation later.

## 2016-12-15 NOTE — Progress Notes (Signed)
Patient's heart rate in Atrial Flutter with weaning of ventilator and Atrial Fibrillation with rate of 150 .Dr Caryl Comes and Dr Nelda Marseille at bedside orders received for cardizem bolus and infusion however patient converted to sinus with rate in 70's after he was placed back on full support with ventilator .Dr Nelda Marseille informed and cardizem not started per orders. Patient's family at bedside and updated .

## 2016-12-16 ENCOUNTER — Inpatient Hospital Stay (HOSPITAL_COMMUNITY): Payer: Medicare Other

## 2016-12-16 LAB — CBC
HEMATOCRIT: 33.8 % — AB (ref 39.0–52.0)
Hemoglobin: 10.3 g/dL — ABNORMAL LOW (ref 13.0–17.0)
MCH: 25.2 pg — AB (ref 26.0–34.0)
MCHC: 30.5 g/dL (ref 30.0–36.0)
MCV: 82.8 fL (ref 78.0–100.0)
Platelets: 419 10*3/uL — ABNORMAL HIGH (ref 150–400)
RBC: 4.08 MIL/uL — ABNORMAL LOW (ref 4.22–5.81)
RDW: 16.6 % — AB (ref 11.5–15.5)
WBC: 9.2 10*3/uL (ref 4.0–10.5)

## 2016-12-16 LAB — PHOSPHORUS
PHOSPHORUS: 5.1 mg/dL — AB (ref 2.5–4.6)
Phosphorus: 4.9 mg/dL — ABNORMAL HIGH (ref 2.5–4.6)

## 2016-12-16 LAB — CULTURE, RESPIRATORY: CULTURE: NORMAL

## 2016-12-16 LAB — BASIC METABOLIC PANEL
Anion gap: 9 (ref 5–15)
BUN: 15 mg/dL (ref 6–20)
CO2: 32 mmol/L (ref 22–32)
Calcium: 8.2 mg/dL — ABNORMAL LOW (ref 8.9–10.3)
Chloride: 102 mmol/L (ref 101–111)
Creatinine, Ser: 0.96 mg/dL (ref 0.61–1.24)
GFR calc Af Amer: >60 mL/min (ref >60)
GFR calc non Af Amer: >60 mL/min (ref >60)
GLUCOSE: 173 mg/dL — AB (ref 65–99)
POTASSIUM: 3.7 mmol/L (ref 3.5–5.1)
SODIUM: 143 mmol/L (ref 135–145)

## 2016-12-16 LAB — GLUCOSE, CAPILLARY
GLUCOSE-CAPILLARY: 194 mg/dL — AB (ref 65–99)
GLUCOSE-CAPILLARY: 187 mg/dL — AB (ref 65–99)
Glucose-Capillary: 202 mg/dL — ABNORMAL HIGH (ref 65–99)
Glucose-Capillary: 133 mg/dL — ABNORMAL HIGH (ref 65–99)

## 2016-12-16 LAB — MAGNESIUM
Magnesium: 2.3 mg/dL (ref 1.7–2.4)
Magnesium: 1.9 mg/dL (ref 1.7–2.4)

## 2016-12-16 LAB — CULTURE, RESPIRATORY W GRAM STAIN

## 2016-12-16 MED ORDER — INSULIN ASPART 100 UNIT/ML ~~LOC~~ SOLN
0.0000 [IU] | Freq: Three times a day (TID) | SUBCUTANEOUS | Status: DC
Start: 2016-12-17 — End: 2016-12-19
  Administered 2016-12-17: 3 [IU] via SUBCUTANEOUS
  Administered 2016-12-17 (×2): 2 [IU] via SUBCUTANEOUS
  Administered 2016-12-17: 3 [IU] via SUBCUTANEOUS
  Administered 2016-12-18: 2 [IU] via SUBCUTANEOUS
  Administered 2016-12-18: 5 [IU] via SUBCUTANEOUS
  Administered 2016-12-18: 2 [IU] via SUBCUTANEOUS
  Administered 2016-12-18: 3 [IU] via SUBCUTANEOUS
  Administered 2016-12-19: 2 [IU] via SUBCUTANEOUS
  Administered 2016-12-19: 5 [IU] via SUBCUTANEOUS

## 2016-12-16 MED ORDER — ORAL CARE MOUTH RINSE
15.0000 mL | Freq: Two times a day (BID) | OROMUCOSAL | Status: DC
  Administered 2016-12-16 – 2016-12-17 (×4): 15 mL via OROMUCOSAL

## 2016-12-16 MED ORDER — POTASSIUM CHLORIDE 20 MEQ/15ML (10%) PO SOLN
40.0000 meq | Freq: Once | ORAL | Status: AC
Start: 2016-12-16 — End: 2016-12-16
  Administered 2016-12-16: 40 meq
  Filled 2016-12-16: qty 30

## 2016-12-16 MED ORDER — POTASSIUM CHLORIDE CRYS ER 20 MEQ PO TBCR: 40.0000 meq | EXTENDED_RELEASE_TABLET | Freq: Once | ORAL | Status: DC

## 2016-12-16 MED ORDER — DILTIAZEM HCL 60 MG PO TABS
60.0000 mg | ORAL_TABLET | Freq: Three times a day (TID) | ORAL | Status: DC
  Administered 2016-12-16 (×2): 60 mg via ORAL
  Filled 2016-12-16 (×4): qty 1

## 2016-12-16 MED ORDER — TORSEMIDE 20 MG PO TABS
40.0000 mg | ORAL_TABLET | Freq: Every day | ORAL | Status: DC
  Administered 2016-12-17 – 2016-12-19 (×3): 40 mg via ORAL
  Filled 2016-12-16 (×3): qty 2

## 2016-12-16 MED ORDER — AMOXICILLIN-POT CLAVULANATE 875-125 MG PO TABS
1.0000 | ORAL_TABLET | Freq: Two times a day (BID) | ORAL | Status: DC
  Administered 2016-12-16 – 2016-12-19 (×7): 1 via ORAL
  Filled 2016-12-16 (×8): qty 1

## 2016-12-16 MED ORDER — DILTIAZEM HCL 30 MG PO TABS
30.0000 mg | ORAL_TABLET | Freq: Four times a day (QID) | ORAL | Status: DC
  Filled 2016-12-16 (×2): qty 1

## 2016-12-16 MED ORDER — AMOXICILLIN-POT CLAVULANATE 400-57 MG/5ML PO SUSR
875.0000 mg | Freq: Two times a day (BID) | ORAL | Status: DC
  Filled 2016-12-16: qty 10.9

## 2016-12-16 NOTE — Progress Notes (Signed)
Progress Note  Patient Name: Nicholas Caldwell Date of Encounter: 12/16/2016  Primary Cardiologist: Heart failure [name])  Subjective   Feels better, extubated today  Inpatient Medications    Scheduled Meds: . amoxicillin-clavulanate  1 tablet Oral Q12H  . diltiazem  60 mg Oral Q8H  . insulin aspart  0-15 Units Subcutaneous Q4H  . mouth rinse  15 mL Mouth Rinse BID  . pantoprazole sodium  40 mg Per Tube Daily  . rivaroxaban  20 mg Oral Q supper  . rosuvastatin  20 mg Oral Daily   Continuous Infusions:  PRN Meds: sodium chloride, metoprolol   Vital Signs    Vitals:   12/16/16 1100 12/16/16 1200 12/16/16 1229 12/16/16 1405  BP: 132/62 138/65  (!) 112/98  Pulse: 84 85    Resp: 20 17    Temp:   99.6 F (37.6 C)   TempSrc:   Oral   SpO2: 96% 99%    Weight:      Height:        Intake/Output Summary (Last 24 hours) at 12/16/16 1429 Last data filed at 12/16/16 1200  Gross per 24 hour  Intake          1702.33 ml  Output             8825 ml  Net         -7122.67 ml   Filed Weights   12/15/16 0147 12/16/16 0030 12/16/16 0402  Weight: 271 lb 6.2 oz (123.1 kg) 260 lb 2.3 oz (118 kg) 260 lb 2.3 oz (118 kg)    Telemetry    AFib, borderline rate control - Personally Reviewed  ECG    AFib, no ST segment changes - Personally Reviewed  Physical Exam   GEN: No acute distress.   Neck: No JVD Cardiac: irregularly irregular, no murmurs, rubs, or gallops.  Respiratory: Clear to auscultation bilaterally. GI: Soft, nontender, non-distended  MS: No edema; No deformity. Neuro:  Nonfocal  Psych: Normal affect   Labs    Chemistry Recent Labs Lab 12/13/16 0253  12/15/16 0157 12/15/16 1358 12/16/16 0538  NA 142  < > 142 142 143  K 3.6  < > 3.9 3.6 3.7  CL 97*  < > 102 99* 102  CO2 34*  < > 30 27 32  GLUCOSE 318*  < > 115* 157* 173*  BUN <5*  < > _0 CREATININE 0.76  < > 1.21 1.13 0.96  CALCIUM 8.3*  < > 8.3* 8.6* 8.2*  PROT 7.4  --   --   --   --     ALBUMIN 2.9*  --   --   --   --   AST 37  --   --   --   --   ALT 17  --   --   --   --   ALKPHOS 124  --   --   --   --   BILITOT 0.6  --   --   --   --   GFRNONAA >60  < > 60* >60 >60  GFRAA >60  < > >60 >60 >60  ANIONGAP 11  < > 10 16* 9  < > = values in this interval not displayed.   Hematology Recent Labs Lab 12/14/16 0515 12/15/16 0157 12/16/16 0538  WBC 8.8 9.9 9.2  RBC 3.83* 3.98* 4.08*  HGB 9.6* 10.0* 10.3*  HCT 32.1* 32.4* 33.8*  MCV 83.8 81.4 82.8  MCH 25.1* 25.1* 25.2*  MCHC 29.9* 30.9 30.5  RDW 16.9* 16.6* 16.6*  PLT 410* 408* 419*    Cardiac Enzymes Recent Labs Lab 12/13/16 0253 12/13/16 1353  TROPONINI 0.03* 0.06*    Recent Labs Lab 12/13/16 0305  TROPIPOC 0.03     BNP Recent Labs Lab 12/13/16 0253  BNP 555.1*     DDimer No results for input(s): DDIMER in the last 168 hours.   Radiology    Dg Chest Port 1 View  Result Date: 12/16/2016 CLINICAL DATA:  Respiratory failure. EXAM: PORTABLE CHEST 1 VIEW COMPARISON:  12/15/2016. FINDINGS: Endotracheal tube, NG tube in stable position. Cardiomegaly with bilateral pulmonary interstitial prominence. Left lower lobe atelectasis and consolidation. Small left pleural effusion. No pneumothorax. IMPRESSION: 1. Lines and tubes in stable position. 2. Cardiomegaly with diffuse bilateral pulmonary interstitial prominence and left pleural effusion consistent with congestive heart failure. 3. Left lower lobe atelectasis and consolidation. Left lower lobe pneumonia cannot be excluded. Electronically Signed   By: Marcello Moores  Register   On: 12/16/2016 06:52   Dg Chest Port 1 View  Result Date: 12/15/2016 CLINICAL DATA:  Endotracheal tube position EXAM: PORTABLE CHEST 1 VIEW COMPARISON:  12/14/2016 FINDINGS: Endotracheal tube in good position.  NG tube in the stomach. Diffuse bilateral airspace disease without significant change. Bibasilar atelectasis. Small pleural effusions bilaterally. IMPRESSION: Endotracheal tube  remains in good position Diffuse bilateral airspace disease unchanged, probable edema versus pneumonia. Bibasilar atelectasis and small effusions unchanged. Electronically Signed   By: Franchot Gallo M.D.   On: 12/15/2016 06:45    Cardiac Studies   No significant CAD by 2016 cath; EF 40%  Patient Profile     71 y.o. male with resp failure, AFib  Assessment & Plan    1) AFib: borderline rate control.  Taking Xarelto for stroke prevention.  BP stable.  COntinue cardizem.  Switching to oral today.  2) Needs EP f/u for possible long QT syndrome, per Dr. Caryl Comes.  3) Chronic systolic heart failure: Restart low dose Demadex.   Signed, Larae Grooms, MD  12/16/2016, 2:29 PM

## 2016-12-16 NOTE — Progress Notes (Signed)
PULMONARY / CRITICAL CARE MEDICINE   Name: Nicholas Caldwell MRN: [id number] DOB: [birth year]    ADMISSION DATE:  12/13/2016 CONSULTATION DATE:    REFERRING MD:  EDP  CHIEF COMPLAINT:  Shortness of breath  BRIEF SUMMARY :   71M with PMH significant for CAD, systolic and diastolic CHF (EF 86-57%), DMII, HLD, HTN, paroxysmal a fib/flutter, pHTN, COPD and tobacco abuse admitted 2/10 with complaints of SOB and flu-like symptoms.  Work up consistent with hypoxic / hypercarbic respiratory failure.  Failed bipap with ABG of 7.15 / 109 / 87.    SUBJECTIVE:   AF with RVR when weaning from the vent on 2/12  VITAL SIGNS: BP (!) 141/71   Pulse 85   Temp 98.7 F (37.1 C) (Oral)   Resp 16   Ht 6' (1.829 m)   Wt 260 lb 2.3 oz (118 kg)   SpO2 95%   BMI 35.28 kg/m   HEMODYNAMICS:    VENTILATOR SETTINGS: Vent Mode: PRVC FiO2 (%):  [40 %] 40 % Set Rate:  [16 bmp-24 bmp] 16 bmp Vt Set:  [580 mL] 580 mL PEEP:  [5 cmH20] 5 cmH20 Pressure Support:  [5 cmH20] 5 cmH20 Plateau Pressure:  [21 cmH20-28 cmH20] 22 cmH20  INTAKE / OUTPUT: I/O last 3 completed shifts: In: 3535.5 [I.V.:1841.5; NG/GT:470; IV Piggyback:1224] Out: 84696 [EXBMW:41324; Emesis/NG output:650]  PHYSICAL EXAMINATION: General:  Adult male in NAD HEENT: MM pink/moist, ETT, NCAT Neuro:  Awake, alert, follow commands appropriately  CV: s1s2 rrr, no m/r/g PULM: even/non-labored, vent assisted breath sounds bilaterally  MW:NUUV, non-tender, bsx4 active  Extremities: warm/dry, 2+ pitting edema to knee and thigh  Skin: no rashes or lesions  LABS:  BMET  Recent Labs Lab 12/15/16 0157 12/15/16 1358 12/16/16 0538  NA 142 142 143  K 3.9 3.6 3.7  CL 102 99* 102  CO2 30 27 32  BUN _0 CREATININE 1.21 1.13 0.96  GLUCOSE 115* 157* 173*    Electrolytes  Recent Labs Lab 12/15/16 0157 12/15/16 1358 12/16/16 0538  CALCIUM 8.3* 8.6* 8.2*  MG 2.5* 2.5* 2.3  PHOS 4.1 4.8* 4.9*    CBC  Recent Labs Lab  12/14/16 0515 12/15/16 0157 12/16/16 0538  WBC 8.8 9.9 9.2  HGB 9.6* 10.0* 10.3*  HCT 32.1* 32.4* 33.8*  PLT 410* 408* 419*    Coag's No results for input(s): APTT, INR in the last 168 hours.  Sepsis Markers  Recent Labs Lab 12/13/16 0308 12/13/16 0338 12/13/16 0441  LATICACIDVEN 2.49* 2.8*  --   PROCALCITON  --   --  0.28    ABG  Recent Labs Lab 12/13/16 0446 12/13/16 1453 12/15/16 0325  PHART 7.429 7.563* 7.494*  PCO2ART 50.6* 37.6 41.0  PO2ART 153.0* 80.0* 240*    Liver Enzymes  Recent Labs Lab 12/13/16 0253  AST 37  ALT 17  ALKPHOS 124  BILITOT 0.6  ALBUMIN 2.9*    Cardiac Enzymes  Recent Labs Lab 12/13/16 0253 12/13/16 1353  TROPONINI 0.03* 0.06*    Glucose  Recent Labs Lab 12/15/16 0007 12/15/16 0417 12/15/16 0757 12/15/16 1139 12/15/16 1534 12/15/16 1959  GLUCAP 123* 109* 122* 118* 184* 154*    Imaging Dg Chest Port 1 View  Result Date: 12/16/2016 CLINICAL DATA:  Respiratory failure. EXAM: PORTABLE CHEST 1 VIEW COMPARISON:  12/15/2016. FINDINGS: Endotracheal tube, NG tube in stable position. Cardiomegaly with bilateral pulmonary interstitial prominence. Left lower lobe atelectasis and consolidation. Small left pleural effusion. No pneumothorax. IMPRESSION: 1. Lines and  tubes in stable position. 2. Cardiomegaly with diffuse bilateral pulmonary interstitial prominence and left pleural effusion consistent with congestive heart failure. 3. Left lower lobe atelectasis and consolidation. Left lower lobe pneumonia cannot be excluded. Electronically Signed   By: Marcello Moores  Register   On: 12/16/2016 06:52   STUDIES:  ECHO 2/10 >> mild LVH, mild reduction in systolic function, LVEF 60-10%, diffuse hypokinesis, LA moderately dilated, PA peak 56 mmHg  CXR 2/13>> Left lower lobe atelectasis and consolidation + pulmonary edema  CULTURES: Influenza 2/10 >> negative BCx2 2/10 >> NGTD  Tracheal Aspirate 2/10 >> Consistent with normal respiratory  flora.  ANTIBIOTICS: Vancomycin 2/10 >> 2/12 Cefepime 2/10 >>  SIGNIFICANT EVENTS: 2/10  Admit hypoxic/hypercarbic resp fx, failed bipap > intubated  2/11  Weaned on PSV then developed AF requiring cardioversion   LINES/TUBES: OETT 2/10 >> Foley 2/10 >>  DISCUSSION: Nicholas Caldwell is a 71M admitted 2/10 with acute on chronic hypoxemic / hypercapneic respiratory failure in the setting of known systolic/diastolic dysfunction and COPD. Some concern for acute viral syndrome with symptom onset > 48h ago. Currently in a fib with RVR, but maintaining adequate BP.  2/10 while on PSV, developed torsades refractory to treatment requiring multiple cardioversion / medications.    ASSESSMENT / PLAN:  PULMONARY A: Acute on chronic hypoxemic/hypercapneic respiratory failure 2/2 acute decompensated mixed CHF +/- viral syndrome Underlying COPD (no PFTs on file), without overt acute exacerbation Pulmonary HTN (RVSP 57 on last TTE) P:   Weaned yesterday resulting in Afib with RVR Extubate today  Diuresis per Cardiology  (lasix 80 IV  TID)  SBT today  CXR consistent with LLL infiltrate  Vs. atelectasis   CARDIOVASCULAR A:  A fib/flutter with RVR - on xarelto, dig, amio at home, now resolved  Acute decompensated CHF (EF 35-40%) Episode of torsades 2/10 with spontaneous resolution Prolonged qt interval  Refractory ventricular rhythm  P:  Remains in Afib on EKG  Diuresis per Cardiology  PRN lopressor for HR >130  Continue cardizem per Cardiology  Keep K >4, Mg >2 Continue xarelto Continue crestor   RENAL A:   AKI, resolved  Hypokalemia, resolved Hypophosphatemia, resolved  P:   Trend BMP / UOP  Replace electrolytes as above   GASTROINTESTINAL A:   Diarrhea - prior to admit, none since admit P:   Begin TF 2/12 PPI  HEMATOLOGIC A:   Anemia - mild, no acute bleeding Chronic a/c for a fib/flutter P:  Continue home xarelto  Trend CBC   INFECTIOUS A:   Possible viral syndrome;  doubt bacterial infxn P:   Follow cultures as above Flu negative  Consider switching to Augmentin from cefepime  ENDOCRINE A:   IDDM   P:   SSI with CBG  NEUROLOGIC A:   ICU associated pain / discomfort  P:   RASS goal: 0 to -1  Monitor neuro exam Fentanyl gtt for pain PRN versed  FAMILY  - Updates:  Wife and patient updated on plan of care 2/12.    - Inter-disciplinary family meet or Palliative Care meeting due by:  2/17  Kerrin Mo, MD  PGY-2, [facility]   Attending Note:  71 year old male with extensive cardiac history who presents to [facility] for respiratory failure after torsades.  He is weaning well this AM with clearing lungs.  I reviewed CXR myself, ETT ok and some pulmonary edema.  Will extubate today.  Continue lasix.  Start cardiazem PO and start titration of cardiazem IV  to off.  Monitor I/o.  Replace electrolytes and will order AM labs.  D/C cefepime and change to augmentin with a total of 8 days of abx.  Hold in the ICU overnight.  The patient is critically ill with multiple organ systems failure and requires high complexity decision making for assessment and support, frequent evaluation and titration of therapies, application of advanced monitoring technologies and extensive interpretation of multiple databases.   Critical Care Time devoted to patient care services described in this note is  35  Minutes. This time reflects time of care of this signee Dr Jennet Maduro. This critical care time does not reflect procedure time, or teaching time or supervisory time of PA/NP/Med student/Med Resident etc but could involve care discussion time.  Rush Farmer, M.D. [facility] Pulmonary/Critical Care Medicine. Pager: [phone]. After hours pager: [phone].

## 2016-12-16 NOTE — Procedures (Signed)
Extubation Procedure Note  Patient Details:   Name: Nicholas Caldwell DOB: [birth year] MRN: [id number]   Airway Documentation:     Evaluation  O2 sats: stable throughout Complications: No apparent complications Patient did tolerate procedure well. Bilateral Breath Sounds: Clear, Diminished   Yes  4l/min [place] Incentive spirometer instructed 1.0 L  [name] 12/16/2016, 10:36 AM

## 2016-12-16 NOTE — Progress Notes (Signed)
PT Cancellation Note  Patient Details Name: Nicholas Caldwell MRN: [id number] DOB: [birth year]   Cancelled Treatment:    Reason Eval/Treat Not Completed: Medical issues which prohibited therapy (order received, pt not yet 4 hours post extubation, will plan to attempt next date)   Lorn Butcher B [place] Web 12/16/2016, 1:26 PM  Elwyn Reach, [place]

## 2016-12-16 NOTE — Progress Notes (Signed)
Nursing Note: Daughter, Sima Matas, lives in [place] and will be traveling to [place] to visit with her father.  She asked for an update and after she produced the passcode the information regarding her father was shared.  She noted concern that her step-mother is not communicating nor understanding the complex nature of her father's current medical situation.

## 2016-12-16 NOTE — Care Management Note (Signed)
Case Management Note  Patient Details  Name: Nicholas Caldwell MRN: [id number] Date of Birth: Mar 04, 1946  Subjective/Objective:   Pt admitted with  SOB  Action/Plan:  Patient lives at home with spouse; [id number]; private insurance with Medicare; he is active with [facility], PT; has home oxygen.     Expected Discharge Date:                  Expected Discharge Plan:  [place]  In-House Referral:     Discharge planning Services  CM Consult  Post Acute Care Choice:    Choice offered to:     DME Arranged:    DME Agency:     HH Arranged:    [place] Agency:     Status of Service:  In process, will continue to follow  If discussed at Long Length of Stay Meetings, dates discussed:    Additional Comments: CM informed by bedside nurse that pts daughter has raised concerns with pt returning home; not taking medications,  Wife in the home but unable to care for pt, frequent admits. Daughter prefers pt to discharge to SNF - pt has refused SNF placement in the past.  CM requested PT evaluation.  CM also contacted [facility] to inquire about HH status - per agency pt accepted in the hospital however was inconsistent with answering the phone, not being available for scheduled appts,  allowing HH in the home on multiple attempts.  CM spoke with pt and pt appears to be alert and oriented; pt denied barriers to taking medications and difficulties communicated by [facility].  Agency will take pt back if discharges home.   CM will continue to follow for discharge needs [name], RN 12/16/2016, 2:56 PM

## 2016-12-17 DIAGNOSIS — I5022 Chronic systolic (congestive) heart failure: Secondary | ICD-10-CM

## 2016-12-17 LAB — GLUCOSE, CAPILLARY
GLUCOSE-CAPILLARY: 184 mg/dL — AB (ref 65–99)
GLUCOSE-CAPILLARY: 175 mg/dL — AB (ref 65–99)
Glucose-Capillary: 148 mg/dL — ABNORMAL HIGH (ref 65–99)
Glucose-Capillary: 135 mg/dL — ABNORMAL HIGH (ref 65–99)

## 2016-12-17 LAB — BASIC METABOLIC PANEL
Anion gap: 11 (ref 5–15)
BUN: 12 mg/dL (ref 6–20)
CALCIUM: 8.2 mg/dL — AB (ref 8.9–10.3)
CHLORIDE: 96 mmol/L — AB (ref 101–111)
CO2: 33 mmol/L — ABNORMAL HIGH (ref 22–32)
CREATININE: 0.87 mg/dL (ref 0.61–1.24)
GFR calc Af Amer: >60 mL/min (ref >60)
Glucose, Bld: 186 mg/dL — ABNORMAL HIGH (ref 65–99)
Potassium: 3.9 mmol/L (ref 3.5–5.1)
SODIUM: 140 mmol/L (ref 135–145)

## 2016-12-17 LAB — CBC
HCT: 37.9 % — ABNORMAL LOW (ref 39.0–52.0)
Hemoglobin: 11.4 g/dL — ABNORMAL LOW (ref 13.0–17.0)
MCH: 25.7 pg — AB (ref 26.0–34.0)
MCHC: 30.1 g/dL (ref 30.0–36.0)
MCV: 85.4 fL (ref 78.0–100.0)
PLATELETS: 407 10*3/uL — AB (ref 150–400)
RBC: 4.44 MIL/uL (ref 4.22–5.81)
RDW: 16.7 % — AB (ref 11.5–15.5)
WBC: 11.1 10*3/uL — ABNORMAL HIGH (ref 4.0–10.5)

## 2016-12-17 LAB — PHOSPHORUS: Phosphorus: 3.8 mg/dL (ref 2.5–4.6)

## 2016-12-17 LAB — MAGNESIUM: Magnesium: 1.9 mg/dL (ref 1.7–2.4)

## 2016-12-17 MED ORDER — DILTIAZEM HCL 30 MG PO TABS
30.0000 mg | ORAL_TABLET | Freq: Four times a day (QID) | ORAL | Status: DC
  Administered 2016-12-17 – 2016-12-18 (×3): 30 mg via ORAL
  Filled 2016-12-17 (×3): qty 1

## 2016-12-17 MED ORDER — DILTIAZEM HCL 30 MG PO TABS
30.0000 mg | ORAL_TABLET | Freq: Four times a day (QID) | ORAL | Status: DC
  Filled 2016-12-17: qty 1

## 2016-12-17 NOTE — Progress Notes (Signed)
CSW engaged with Patient's daughter, Sima Matas [phone]) regarding concerns with her father's recurrent admissions. Patient's daughter reports that she lives in [place] and reports that Patient's wife is mentally ill and she feels that Patient's wife is incapable of adequately caring for the Patient. CSW explained to Ms. Rollene Rotunda that per MD, Patient is competent to make his own decisions at this time and Patient is reporting that his wife is available and able to assist at discharge. CSW explained that at this point PT is recommending HH PT. CSW discussed with Ms. Rollene Rotunda that per RN Case Manager [facility] will take patient back if he discharges home. CSW discussed the process of Healthcare Power of Attorney vs. Guardianship and emphasized that HCPOA has to be initiated by the patient and in order to obtain legal guardianship, the Patient must be deemed incompetent by a medical professional or adjudicated incompetent by the court.   CSW discussed making a report with Adult Protective Services. Ms. Rollene Rotunda reports that she has already made a report and is awaiting a return phone call from [facility] regarding the report and further action. Ms. Bobby Rumpf was appreciative of information provided. No further questions reported at this time.     Lorrine Kin, MSW, LCSW [facility] ED/65M Clinical Social Worker [phone]

## 2016-12-17 NOTE — Progress Notes (Signed)
Progress Note  Patient Name: Nicholas Caldwell Date of Encounter: 12/17/2016  Primary Cardiologist: Heart failure [name])  Subjective   Feels better, HR dropped to 30s at night so cardizem held.  Inpatient Medications    Scheduled Meds: . amoxicillin-clavulanate  1 tablet Oral Q12H  . diltiazem  30 mg Oral Q6H  . insulin aspart  0-15 Units Subcutaneous TID AC & HS  . mouth rinse  15 mL Mouth Rinse BID  . rivaroxaban  20 mg Oral Q supper  . rosuvastatin  20 mg Oral Daily  . torsemide  40 mg Oral Daily   Continuous Infusions:  PRN Meds: sodium chloride, metoprolol   Vital Signs    Vitals:   12/17/16 1000 12/17/16 1100 12/17/16 1132 12/17/16 1300  BP: 135/87 108/65  111/61  Pulse: 91 79  (!) 55  Resp: 15 (!) 21  (!) 22  Temp:   98.1 F (36.7 C)   TempSrc:   Oral   SpO2: (!) 88% 97%  96%  Weight:      Height:        Intake/Output Summary (Last 24 hours) at 12/17/16 1354 Last data filed at 12/17/16 1200  Gross per 24 hour  Intake             1080 ml  Output             2250 ml  Net            -1170 ml   Filed Weights   12/16/16 0030 12/16/16 0402 12/17/16 0500  Weight: 260 lb 2.3 oz (118 kg) 260 lb 2.3 oz (118 kg) 256 lb 9.9 oz (116.4 kg)    Telemetry    AFib, borderline rate control - Personally Reviewed  ECG    AFib, no ST segment changes - Personally Reviewed  Physical Exam   GEN: No acute distress.   Neck: No JVD Cardiac: irregularly irregular, no murmurs, rubs, or gallops.  Respiratory: Clear to auscultation bilaterally. GI: Soft, nontender, non-distended  MS: No edema; No deformity. Neuro:  Nonfocal  Psych: Normal affect   Labs    Chemistry Recent Labs Lab 12/13/16 0253  12/15/16 1358 12/16/16 0538 12/17/16 0218  NA 142  < > 142 143 140  K 3.6  < > 3.6 3.7 3.9  CL 97*  < > 99* 102 96*  CO2 34*  < > 27 32 33*  GLUCOSE 318*  < > 157* 173* 186*  BUN <5*  < > _0 CREATININE 0.76  < > 1.13 0.96 0.87  CALCIUM 8.3*  < > 8.6* 8.2*  8.2*  PROT 7.4  --   --   --   --   ALBUMIN 2.9*  --   --   --   --   AST 37  --   --   --   --   ALT 17  --   --   --   --   ALKPHOS 124  --   --   --   --   BILITOT 0.6  --   --   --   --   GFRNONAA >60  < > >60 >60 >60  GFRAA >60  < > >60 >60 >60  ANIONGAP 11  < > 16* 9 11  < > = values in this interval not displayed.   Hematology  Recent Labs Lab 12/15/16 0157 12/16/16 0538 12/17/16 0218  WBC 9.9 9.2 11.1*  RBC 3.98* 4.08*  4.44  HGB 10.0* 10.3* 11.4*  HCT 32.4* 33.8* 37.9*  MCV 81.4 82.8 85.4  MCH 25.1* 25.2* 25.7*  MCHC 30.9 30.5 30.1  RDW 16.6* 16.6* 16.7*  PLT 408* 419* 407*    Cardiac Enzymes  Recent Labs Lab 12/13/16 0253 12/13/16 1353  TROPONINI 0.03* 0.06*     Recent Labs Lab 12/13/16 0305  TROPIPOC 0.03     BNP  Recent Labs Lab 12/13/16 0253  BNP 555.1*     DDimer No results for input(s): DDIMER in the last 168 hours.   Radiology    Dg Chest Port 1 View  Result Date: 12/16/2016 CLINICAL DATA:  Respiratory failure. EXAM: PORTABLE CHEST 1 VIEW COMPARISON:  12/15/2016. FINDINGS: Endotracheal tube, NG tube in stable position. Cardiomegaly with bilateral pulmonary interstitial prominence. Left lower lobe atelectasis and consolidation. Small left pleural effusion. No pneumothorax. IMPRESSION: 1. Lines and tubes in stable position. 2. Cardiomegaly with diffuse bilateral pulmonary interstitial prominence and left pleural effusion consistent with congestive heart failure. 3. Left lower lobe atelectasis and consolidation. Left lower lobe pneumonia cannot be excluded. Electronically Signed   By: Marcello Moores  Register   On: 12/16/2016 06:52    Cardiac Studies   No significant CAD by 2016 cath; EF 40%  Patient Profile     71 y.o. male with resp failure, AFib  Assessment & Plan    1) AFib: borderline rate control.  Taking Xarelto for stroke prevention.  BP stable.  COntinue cardizem, but change dose to 30 mg q6 hours.  Tolerating oral.  2) Needs EP  f/u for possible long QT syndrome, per Dr. Caryl Comes.  WIll also need f/u in CHF clinic ([facility]) that was scheduled before he became ill.  He would like to have that appt in hand before leaving the hospital.  3) Chronic systolic heart failure: Restarted low dose Demadex.  Cr stable.  Signed, Larae Grooms, MD  12/17/2016, 1:54 PM

## 2016-12-17 NOTE — Progress Notes (Signed)
PULMONARY / CRITICAL CARE MEDICINE   Name: Nicholas Caldwell MRN: [id number] DOB: 1946-03-14    ADMISSION DATE:  12/13/2016 CONSULTATION DATE:    REFERRING MD:  EDP  CHIEF COMPLAINT:  Shortness of breath  BRIEF SUMMARY :   74M with PMH significant for CAD, systolic and diastolic CHF (EF 78-29%), DMII, HLD, HTN, paroxysmal a fib/flutter, pHTN, COPD and tobacco abuse admitted 2/10 with complaints of SOB and flu-like symptoms.  Work up consistent with hypoxic / hypercarbic respiratory failure.  Failed bipap with ABG of 7.15 / 109 / 87.    SUBJECTIVE:   Successful extubation on 2/13 Doing well, no complaints   VITAL SIGNS: BP 111/74   Pulse 67   Temp 98.1 F (36.7 C) (Oral)   Resp 18   Ht 6' (1.829 m)   Wt 256 lb 9.9 oz (116.4 kg)   SpO2 93%   BMI 34.80 kg/m   HEMODYNAMICS:    VENTILATOR SETTINGS: Vent Mode: PSV;CPAP FiO2 (%):  [40 %] 40 % PEEP:  [5 cmH20] 5 cmH20 Pressure Support:  [5 cmH20-10 cmH20] 5 cmH20  INTAKE / OUTPUT: I/O last 3 completed shifts: In: 1453.3 [P.O.:360; I.V.:853.3; NG/GT:140; IV Piggyback:100] Out: 9250 [Urine:9250]  PHYSICAL EXAMINATION: General:  Adult male in NAD HEENT: MM pink/moist,  Neuro:  Alert and orientated x 3, moving all extremities  CV: s1s2 rrr, no m/r/g PULM: CTAB, no increased WOB  FA:OZHY, non-tender, bsx4 active  Extremities: warm/dry, 2+ pitting edema to the knee  Skin: no rashes or lesions  LABS:  BMET  Recent Labs Lab 12/15/16 1358 12/16/16 0538 12/17/16 0218  NA 142 143 140  K 3.6 3.7 3.9  CL 99* 102 96*  CO2 27 32 33*  BUN _0 CREATININE 1.13 0.96 0.87  GLUCOSE 157* 173* 186*    Electrolytes  Recent Labs Lab 12/15/16 1358 12/16/16 0538 12/16/16 1700 12/17/16 0218  CALCIUM 8.6* 8.2*  --  8.2*  MG 2.5* 2.3 1.9  --   PHOS 4.8* 4.9* 5.1*  --     CBC  Recent Labs Lab 12/15/16 0157 12/16/16 0538 12/17/16 0218  WBC 9.9 9.2 11.1*  HGB 10.0* 10.3* 11.4*  HCT 32.4* 33.8* 37.9*  PLT 408*  419* 407*    Coag's No results for input(s): APTT, INR in the last 168 hours.  Sepsis Markers  Recent Labs Lab 12/13/16 0308 12/13/16 0338 12/13/16 0441  LATICACIDVEN 2.49* 2.8*  --   PROCALCITON  --   --  0.28    ABG  Recent Labs Lab 12/13/16 0446 12/13/16 1453 12/15/16 0325  PHART 7.429 7.563* 7.494*  PCO2ART 50.6* 37.6 41.0  PO2ART 153.0* 80.0* 240*    Liver Enzymes  Recent Labs Lab 12/13/16 0253  AST 37  ALT 17  ALKPHOS 124  BILITOT 0.6  ALBUMIN 2.9*    Cardiac Enzymes  Recent Labs Lab 12/13/16 0253 12/13/16 1353  TROPONINI 0.03* 0.06*    Glucose  Recent Labs Lab 12/15/16 1959 12/16/16 0809 12/16/16 1226 12/16/16 1538 12/16/16 2005 12/17/16 0744  GLUCAP 154* 202* 133* 194* 187* 148*    Imaging No results found. STUDIES:  ECHO 2/10 >> mild LVH, mild reduction in systolic function, LVEF 86-57%, diffuse hypokinesis, LA moderately dilated, PA peak 56 mmHg  CXR 2/13>> Left lower lobe atelectasis and consolidation + pulmonary edema  CULTURES: Influenza 2/10 >> negative BCx2 2/10 >> NGTD  Tracheal Aspirate 2/10 >> Consistent with normal respiratory flora.  ANTIBIOTICS: Vancomycin 2/10 >> 2/12 Cefepime 2/10 >>  2/13 Augmentin 2/13>>  SIGNIFICANT EVENTS: 2/10  Admit hypoxic/hypercarbic resp fx, failed bipap > intubated  2/11  Weaned on PSV then developed AF requiring cardioversion   LINES/TUBES: OETT 2/10 >> Foley 2/10 >>  DISCUSSION: Nicholas Caldwell is a 45M admitted 2/10 with acute on chronic hypoxemic / hypercapneic respiratory failure in the setting of known systolic/diastolic dysfunction and COPD. Some concern for acute viral syndrome with symptom onset > 48h ago. Currently in a fib with RVR, but maintaining adequate BP.  2/10 while on PSV, developed torsades refractory to treatment requiring multiple cardioversion / medications.    ASSESSMENT / PLAN:  PULMONARY A: Acute on chronic hypoxemic/hypercapneic respiratory failure 2/2  acute decompensated mixed CHF +/- viral syndrome Underlying COPD (no PFTs on file), without overt acute exacerbation Pulmonary HTN (RVSP 57 on last TTE) P:   Normal WOB CXR consistent with LLL infiltrate  Vs. atelectasis   CARDIOVASCULAR A:  A fib/flutter with RVR: on xarelto, dig, amio at home, now resolved  Acute decompensated CHF (EF 35-40%) Episode of torsades 2/10 with spontaneous resolution Prolonged qt interval  Refractory ventricular rhythm  P:  Needs EP f/u for possible long QT syndrome PRN lopressor for HR >130   Switched to oral Cardizem , rate controlled Keep K >4, Mg >2 Cardiology following  Continue xarelto Continue crestor  Cardiology to restart home torsemide   RENAL A:   AKI, resolved  Hypokalemia, resolved Hypophosphatemia, resolved  P:   Trend BMP / UOP  Replace electrolytes as above   GASTROINTESTINAL A:   Nutrition  P:    Dysphagia Diet as tolerated   HEMATOLOGIC A:   Anemia - mild, no acute bleeding Chronic a/c for a fib/flutter P:  Continue home xarelto  Trend CBC   INFECTIOUS A:   Possible viral syndrome; doubt bacterial infxn P:   Follow cultures as above Flu negative  Continue Augmentin   ENDOCRINE A:   IDDM   P:   SSI with CBG  NEUROLOGIC A:   ICU associated pain / discomfort  P:   RASS goal: 0 to -1  Monitor neuro exam Fentanyl gtt for pain PRN versed  FAMILY  - Updates:  Wife and patient updated on plan of care 2/12.    - Inter-disciplinary family meet or Palliative Care meeting due by:  2/17  Kerrin Mo, MD  PGY-2, [facility]   Attending Note:  71 year old male with extensive cardiac history who presents with respiratory failure post torsades and a-fib with RVR.  Now patient is on PO cardiazem.  On exam, CTA bilaterally.  I reviewed CXR myself, lungs are normal.  Discussed with FPTS-resident.   Acute respiratory failure due to cardiac disease:  - Extubated  - Monitor for airway  protection  Hypoxemia:  - Titrate O2 for sat of 88-92%  - May need ambulatory desat study prior to discharge for home O2  Torsades:  - Maintain Mg >2.  A-fib with RVR  - Cardizem PO  - Tele monitoring  Transfer to tele and to [facility] service with PCCM off 2/15.  Patient seen and examined, agree with above note.  I dictated the care and orders written for this patient under my direction.  Rush Farmer, MD [phone]

## 2016-12-17 NOTE — Progress Notes (Signed)
Report given to [place] on 3E. Pt transferred via bed with nurse and NT. VS WNL.

## 2016-12-17 NOTE — Evaluation (Signed)
Physical Therapy Evaluation Patient Details Name: Nicholas Caldwell MRN: [id number] DOB: 10-08-1946 Today's Date: 12/17/2016   History of Present Illness  pt presents with respiratory failure, Hypoxemia, A-fib s/p Cardioversion.  pt with hx of A-fib, COPD, CHF, CAD, DM, HTN, Pulmonary HTN, and Chronic Pain.    Clinical Impression  Pt generally weak and deconditioned at this time.  Pt on 2L O2 at this time, however pt indicates he uses 3L at home.  O2 sats remained mid 90s throughout session.  Pt moves slowly and indicates he has been limited in his mobility at home due to arthritis in his R hip.  Wife is available and able to A at D/C.  Feel pt should progress to be able to return to home with HHPT.  Will continue to follow.      Follow Up Recommendations Home health PT;Supervision/Assistance - 24 hour    Equipment Recommendations  None recommended by PT    Recommendations for Other Services       Precautions / Restrictions Precautions Precautions: Fall Restrictions Weight Bearing Restrictions: No      Mobility  Bed Mobility Overal bed mobility: Needs Assistance Bed Mobility: Supine to Sit     Supine to sit: Min assist;HOB elevated     General bed mobility comments: MinA for balance and increased time with HOB elevated.    Transfers Overall transfer level: Needs assistance Equipment used: Rolling walker (2 wheeled) Transfers: Sit to/from [facility] Sit to Stand: Min assist;Mod assist Stand pivot transfers: Min assist       General transfer comment: pt needs MinA to come to stand from bed height, but ModA from lower 3-in-1.  pt moves slowly and tends to almost cross his feet with coming to stand by bringing L LE closer to midline for power up to standing on good LE.    Ambulation/Gait                Stairs            Wheelchair Mobility    Modified Rankin (Stroke Patients Only)       Balance Overall balance assessment: Needs  assistance Sitting-balance support: No upper extremity supported;Feet supported Sitting balance-Leahy Scale: Fair     Standing balance support: Bilateral upper extremity supported;Single extremity supported;During functional activity Standing balance-Leahy Scale: Poor                               Pertinent Vitals/Pain Pain Assessment: No/denies pain    Home Living Family/patient expects to be discharged to:: Private residence Living Arrangements: Spouse/significant other Available Help at Discharge: Family;Available 24 hours/day Type of Home: House Home Access: Stairs to enter Entrance Stairs-Rails: None Entrance Stairs-Number of Steps: 1 Home Layout: One level Home Equipment: Walker - 2 wheels;Cane - single point;Bedside commode (Oxygen) Additional Comments: On 3L of O2 at all times at home    Prior Function Level of Independence: Needs assistance   Gait / Transfers Assistance Needed: Uses RW or SPC  ADL's / Homemaking Assistance Needed: Assist from wife with bathing and lower body dressing and all IADL        Hand Dominance   Dominant Hand: Right    Extremity/Trunk Assessment   Upper Extremity Assessment Upper Extremity Assessment: Generalized weakness    Lower Extremity Assessment Lower Extremity Assessment: Generalized weakness;RLE deficits/detail RLE Deficits / Details: pt indicates R hip pain due to arthritis.    Cervical /  Trunk Assessment Cervical / Trunk Assessment: Normal  Communication   Communication: No difficulties  Cognition Arousal/Alertness: Awake/alert Behavior During Therapy: WFL for tasks assessed/performed Overall Cognitive Status: Within Functional Limits for tasks assessed                      General Comments      Exercises     Assessment/Plan    PT Assessment Patient needs continued PT services  PT Problem List Decreased strength;Decreased activity tolerance;Decreased balance;Decreased mobility;Decreased  coordination;Decreased knowledge of use of DME;Cardiopulmonary status limiting activity          PT Treatment Interventions DME instruction;Gait training;Stair training;Functional mobility training;Therapeutic activities;Therapeutic exercise;Balance training;Patient/family education    PT Goals (Current goals can be found in the Care Plan section)  Acute Rehab PT Goals Patient Stated Goal: Home PT Goal Formulation: With patient Time For Goal Achievement: 12/31/16 Potential to Achieve Goals: Good    Frequency Min 3X/week   Barriers to discharge        Co-evaluation               End of Session Equipment Utilized During Treatment: Gait belt;Oxygen Activity Tolerance: Patient tolerated treatment well Patient left: in chair;with call bell/phone within reach;with chair alarm set;with family/visitor present Nurse Communication: Mobility status         Time: 7116-5790 PT Time Calculation (min) (ACUTE ONLY): 40 min   Charges:   PT Evaluation $PT Eval Moderate Complexity: 1 Procedure PT Treatments $Therapeutic Activity: 8-22 mins   PT G CodesCatarina Hartshorn, PT  [phone] 12/17/2016, 11:20 AM

## 2016-12-18 DIAGNOSIS — I483 Typical atrial flutter: Secondary | ICD-10-CM

## 2016-12-18 LAB — GLUCOSE, CAPILLARY
GLUCOSE-CAPILLARY: 223 mg/dL — AB (ref 65–99)
GLUCOSE-CAPILLARY: 191 mg/dL — AB (ref 65–99)
Glucose-Capillary: 125 mg/dL — ABNORMAL HIGH (ref 65–99)
Glucose-Capillary: 144 mg/dL — ABNORMAL HIGH (ref 65–99)

## 2016-12-18 LAB — CULTURE, BLOOD (ROUTINE X 2)
CULTURE: NO GROWTH
Culture: NO GROWTH

## 2016-12-18 MED ORDER — CARVEDILOL 12.5 MG PO TABS
12.5000 mg | ORAL_TABLET | Freq: Two times a day (BID) | ORAL | Status: DC
Start: 2016-12-18 — End: 2016-12-19
  Administered 2016-12-18 – 2016-12-19 (×2): 12.5 mg via ORAL
  Filled 2016-12-18 (×2): qty 1

## 2016-12-18 MED ORDER — ADULT MULTIVITAMIN W/MINERALS CH
1.0000 | ORAL_TABLET | Freq: Every day | ORAL | Status: DC
  Administered 2016-12-18 – 2016-12-19 (×2): 1 via ORAL
  Filled 2016-12-18 (×2): qty 1

## 2016-12-18 MED ORDER — PREMIER PROTEIN SHAKE
11.0000 [oz_av] | ORAL | Status: DC
  Filled 2016-12-18 (×2): qty 325.31

## 2016-12-18 NOTE — Progress Notes (Signed)
Patient ID: Nicholas Caldwell, male   DOB: [birth year], 71 y.o.   MRN: [id number]    PROGRESS NOTE  Nicholas Caldwell  LNL:[id number] DOB: July 19, 1946 DOA: 12/13/2016  PCP: [facility]   Brief Narrative:  [phone] with PMH significant for CAD, systolic and diastolic CHF (EF 14-48%), DMII, HLD, HTN, paroxysmal a fib/flutter, pHTN, COPD and tobacco abuse admitted 2/10 with complaints of SOB and flu-like symptoms.  Work up consistent with hypoxic / hypercarbic respiratory failure.  Failed bipap with ABG of 7.15 / 109 / 87.    SIGNIFICANT EVENTS: 2/10  Admit hypoxic/hypercarbic resp fx, failed bipap > intubated  2/11  Weaned on PSV then developed AF requiring cardioversion   Assessment & Plan:  Acute on chronic hypoxemic/hypercapneic respiratory failure 2/2 acute decompensated mixed CHF +/- viral syndrome Underlying COPD (no PFTs on file), without overt acute exacerbation Pulmonary HTN (RVSP 57 on last TTE) - extubated 2/14, respiratory status stable this AM  - CXR consistent with LLL infiltrate  Vs. atelectasis  - continue with Augmentin   A fib/flutter with RVR - on xarelto, dig, amio at home, now resolved  Acute decompensated CHF (EF 35-40%) Episode of torsades 2/10 with spontaneous resolution Prolonged qt interval  Refractory ventricular rhythm  - Remains in Afib on EKG  - Diuresis per Cardiology  - Keep K >4, Mg >2 - Continue xarelto - Continue crestor   AKI, resolved  Hypokalemia, resolved Hypophosphatemia, resolved  - BMP in AM  Diarrhea  - resolved   Anemia - mild, no acute bleeding Chronic a/c for a fib/flutter - Continue home xarelto  - Trend CBC   Possible viral syndrome; doubt bacterial infxn - Flu negative  - continue Augmentin   IDDM    - SSI with CBG  ICU associated pain / discomfort  - resolved   Obesity - Body mass index is 34.61 kg/m.  DVT prophylaxis: Rivaroxaban Code Status: Full Family Communication: Patient at bedside  Disposition Plan:  Hone in 1-2 days  CONSULTANTS  Cardiology   STUDIES:   ECHO 2/10 >> mild LVH, mild reduction in systolic function, LVEF 18-56%, diffuse hypokinesis, LA moderately dilated, PA peak 56 mmHg   CXR 2/13>> Left lower lobe atelectasis and consolidation + pulmonary edema   CULTURES:  Influenza 2/10 >> negative  BCx2 2/10 >> NGTD   Tracheal Aspirate 2/10 >> Consistent with normal respiratory flora.  Subjective: No events overnight.   Objective: Vitals:   12/18/16 0604 12/18/16 0748 12/18/16 1243 12/18/16 1535  BP: 135/68 127/72 127/62 105/62  Pulse: 84 76 64 70  Resp: _0 (!) 23  Temp: 98.4 F (36.9 C) 98.2 F (36.8 C)  98.2 F (36.8 C)  TempSrc: Oral Oral  Oral  SpO2: 99% 98% 99% 100%  Weight: 115.8 kg (255 lb 3.2 oz)     Height:        Intake/Output Summary (Last 24 hours) at 12/18/16 1925 Last data filed at 12/18/16 1636  Gross per 24 hour  Intake             1510 ml  Output             2360 ml  Net             -850 ml   Filed Weights   12/17/16 0500 12/17/16 1701 12/18/16 0604  Weight: 116.4 kg (256 lb 9.9 oz) 114.2 kg (251 lb 11.2 oz) 115.8 kg (255 lb 3.2 oz)    Examination:  General:  Adult male in NAD HEENT: MM pink/moist, ETT, NCAT Neuro:  Awake, alert, follow commands appropriately  CV: s1s2 rrr, no m/r/g PULM: even/non-labored, vent assisted breath sounds bilaterally  SU:ORVI, non-tender, bsx4 active  Extremities: warm/dry, 2+ pitting edema to knee and thigh  Skin: no rashes or lesions  Data Reviewed: I have personally reviewed following labs and imaging studies  CBC:  Recent Labs Lab 12/13/16 0253  12/13/16 1353  12/13/16 1957 12/14/16 0515 12/15/16 0157 12/16/16 0538 12/17/16 0218  WBC 8.0  --  7.0  --  6.5 8.8 9.9 9.2 11.1*  NEUTROABS 4.9  --  5.2  --   --   --   --   --   --   HGB 12.5*  < > 11.5*  < > 10.4* 9.6* 10.0* 10.3* 11.4*  HCT 42.9  < > 38.7*  < > 35.2* 32.1* 32.4* 33.8* 37.9*  MCV 89.4  --  86.8  --  86.3 83.8 81.4  82.8 85.4  PLT 362  --  420*  --  397 410* 408* 419* 407*  < > = values in this interval not displayed. Basic Metabolic Panel:  Recent Labs Lab 12/14/16 0515 12/14/16 1853 12/15/16 0157 12/15/16 1358 12/16/16 0538 12/16/16 1700 12/17/16 0218 12/17/16 0814  NA 142  --  142 142 143  --  140  --   K 3.9 4.4 3.9 3.6 3.7  --  3.9  --   CL 101  --  102 99* 102  --  96*  --   CO2 29  --  30 27 32  --  33*  --   GLUCOSE 186*  --  115* 157* 173*  --  186*  --   BUN 8  --  _0 --  12  --   CREATININE 0.91  --  1.21 1.13 0.96  --  0.87  --   CALCIUM 7.7*  --  8.3* 8.6* 8.2*  --  8.2*  --   MG 2.5*  --  2.5* 2.5* 2.3 1.9  --  1.9  PHOS 2.4*  --  4.1 4.8* 4.9* 5.1*  --  3.8   Liver Function Tests:  Recent Labs Lab 12/13/16 0253  AST 37  ALT 17  ALKPHOS 124  BILITOT 0.6  PROT 7.4  ALBUMIN 2.9*   Cardiac Enzymes:  Recent Labs Lab 12/13/16 0253 12/13/16 1353  TROPONINI 0.03* 0.06*   CBG:  Recent Labs Lab 12/17/16 1549 12/17/16 2047 12/18/16 0737 12/18/16 1201 12/18/16 1634  GLUCAP 175* 135* 125* 223* 144*   Urine analysis:    Component Value Date/Time   COLORURINE YELLOW 12/13/2016 0420   APPEARANCEUR CLEAR 12/13/2016 0420   LABSPEC 1.010 12/13/2016 0420   PHURINE 5.0 12/13/2016 0420   GLUCOSEU >=500 (A) 12/13/2016 0420   HGBUR NEGATIVE 12/13/2016 0420   BILIRUBINUR NEGATIVE 12/13/2016 0420   KETONESUR NEGATIVE 12/13/2016 0420   PROTEINUR 100 (A) 12/13/2016 0420   UROBILINOGEN 0.2 10/26/2014 2206   NITRITE NEGATIVE 12/13/2016 0420   LEUKOCYTESUR NEGATIVE 12/13/2016 0420   Recent Results (from the past 240 hour(s))  Culture, blood (routine x 2)     Status: None   Collection Time: 12/13/16  3:10 AM  Result Value Ref Range Status   Specimen Description BLOOD RIGHT HAND  Final   Special Requests AEROBIC BOTTLE ONLY 5ML  Final   Culture NO GROWTH 5 DAYS  Final   Report Status 12/18/2016 FINAL  Final  Culture, blood (routine  x 2)     Status: None    Collection Time: 12/13/16  3:26 AM  Result Value Ref Range Status   Specimen Description BLOOD RIGHT HAND  Final   Special Requests AEROBIC BOTTLE ONLY 3ML  Final   Culture NO GROWTH 5 DAYS  Final   Report Status 12/18/2016 FINAL  Final  MRSA PCR Screening     Status: None   Collection Time: 12/13/16  5:47 AM  Result Value Ref Range Status   MRSA by PCR NEGATIVE NEGATIVE Final    Comment:        The GeneXpert MRSA Assay (FDA approved for NASAL specimens only), is one component of a comprehensive MRSA colonization surveillance program. It is not intended to diagnose MRSA infection nor to guide or monitor treatment for MRSA infections.   Culture, respiratory (tracheal aspirate)     Status: None   Collection Time: 12/13/16  8:51 PM  Result Value Ref Range Status   Specimen Description TRACHEAL ASPIRATE  Final   Special Requests NONE  Final   Gram Stain   Final    FEW WBC PRESENT, PREDOMINANTLY PMN FEW SQUAMOUS EPITHELIAL CELLS PRESENT RARE GRAM POSITIVE COCCOBACILLUS RARE GRAM VARIABLE ROD    Culture Consistent with normal respiratory flora.  Final   Report Status 12/16/2016 FINAL  Final    Radiology Studies: No results found.  Scheduled Meds: . amoxicillin-clavulanate  1 tablet Oral Q12H  . carvedilol  12.5 mg Oral BID WC  . insulin aspart  0-15 Units Subcutaneous TID AC & HS  . mouth rinse  15 mL Mouth Rinse BID  . multivitamin with minerals  1 tablet Oral Daily  . protein supplement shake  11 oz Oral Q24H  . rivaroxaban  20 mg Oral Q supper  . rosuvastatin  20 mg Oral Daily  . torsemide  40 mg Oral Daily   Continuous Infusions:   LOS: 5 days   Time spent: 20 minutes   Faye Ramsay, MD [facility] Pager [phone]  If 7PM-7AM, please contact night-coverage [email or web address] Password [facility] 12/18/2016, 7:25 PM

## 2016-12-18 NOTE — Consult Note (Signed)
   [facility] Inpatient Consult   12/18/2016  Blade Scheff [birth year] [id number]   Patient was assessed for [facility] for community services for multiple hospital admission in the [facility]. Patient was previously active with [facility] with the [place].  Met with patient at bedside regarding being restarted with [facility] services.  Patient endorses that he had been previously active with [facility] but was having difficulty getting his therapy orders restarted with the [facility] in [place].  Patient has an active consent form on file.  He states she wouldn't mind follow up. Explained the differences between home health care and care management. Of note, [facility] Care Management services does not replace or interfere with any services that are arranged by inpatient case management or social work. For additional questions or referrals please contact:   Natividad Brood, RN BSN [facility] Liaison  [phone] business mobile phone Toll free office [phone]

## 2016-12-18 NOTE — Progress Notes (Signed)
Social worker from [place] arrived on the department requesting the nurse for the patient and asking for a copy of the patient's medical record. The social worker presented the nurse with a written document requesting this information. Nursing leadership was notified. Upon arrival to the patient's room 2 social workers from [place] were in speaking with the patient. Once they exited the room they were notified that we could not release medical information on a patient because we are bound by HIPPA and the patient is competent to make his own decisions. They informed me that they ([facility]) are not bound by HIPPA, I again responded that we are. The social worker then asked for the form back and stated that they would just follow up with the patient once he is discharged.

## 2016-12-18 NOTE — Progress Notes (Signed)
Nutrition Follow-up  DOCUMENTATION CODES:   Obesity unspecified  INTERVENTION:  Provide Premier Protein once daily, provides 160 kcal and 30 grams of protein Provide Multivitamin with minerals daily   NUTRITION DIAGNOSIS:   Inadequate oral intake related to inability to eat as evidenced by NPO status.  Discontinued/resolved; diet advanced on 2/13  GOAL:   Patient will meet greater than or equal to 90% of their needs  unmet  MONITOR:   PO intake, Skin, Weight trends, Labs, I & O's  REASON FOR ASSESSMENT:   Consult Enteral/tube feeding initiation and management  ASSESSMENT:   71 y/o male PMHx CAD, CHF, DM2, HLD, HTN, AFIB, htn, COPD, tobacco abuse. Presents from home with SOB and flu like symptoms for past few days. Urgently intubated on arrival to the ED due to ABG values. Admitted for Acute on chronic resp failure secondary to decompensated HF and +/- viral syndrome.   Attempted to visit patient multiple times today. Pt signing pt with staff at time of first visit and sleeping at time of second visit. Pt's wife at bedside this afternoon reports that pt is eating very well like he usually does. Meal completion has been 100% per nursing notes.  Per chart review, pt has received nutrition education regarding low sodium diet multiple times in the past.   Labs: low chloride, low calcium, low hemoglobin, elevated phosphorus  Diet Order:  Diet Carb Modified Fluid consistency: Thin; Room service appropriate? Yes  Skin:  Wound (see comment) (diabetic ulcer on left leg)  Last BM:  2/14  Height:   Ht Readings from Last 1 Encounters:  12/17/16 6' (1.829 m)    Weight:   Wt Readings from Last 1 Encounters:  12/18/16 255 lb 3.2 oz (115.8 kg)    Ideal Body Weight:  80.91 kg  BMI:  Body mass index is 34.61 kg/m.  Estimated Nutritional Needs:   Kcal:  9412-9047  Protein:  115-130 grams  Fluid:  2-2.3 L/day  EDUCATION NEEDS:   No education needs identified at  this time  Scarlette Ar RD, LDN, CSP Inpatient Clinical Dietitian Pager: [phone] After Hours Pager: [phone]

## 2016-12-18 NOTE — Discharge Instructions (Addendum)
Information on my medicine - XARELTO (Rivaroxaban)  This medication education was reviewed with me or my healthcare representative as part of my discharge preparation.  The pharmacist that spoke with me during my hospital stay was:  Wayland Salinas, [facility]  Why was Xarelto prescribed for you? Xarelto was prescribed for you to reduce the risk of a blood clot forming that can cause a stroke if you have a medical condition called atrial fibrillation (a type of irregular heartbeat).  What do you need to know about xarelto ? Take your Xarelto ONCE DAILY at the same time every day with your evening meal. If you have difficulty swallowing the tablet whole, you may crush it and mix in applesauce just prior to taking your dose.  Take Xarelto exactly as prescribed by your doctor and DO NOT stop taking Xarelto without talking to the doctor who prescribed the medication.  Stopping without other stroke prevention medication to take the place of Xarelto may increase your risk of developing a clot that causes a stroke.  Refill your prescription before you run out.  After discharge, you should have regular check-up appointments with your healthcare provider that is prescribing your Xarelto.  In the future your dose may need to be changed if your kidney function or weight changes by a significant amount.  What do you do if you miss a dose? If you are taking Xarelto ONCE DAILY and you miss a dose, take it as soon as you remember on the same day then continue your regularly scheduled once daily regimen the next day. Do not take two doses of Xarelto at the same time or on the same day.   Important Safety Information A possible side effect of Xarelto is bleeding. You should call your healthcare provider right away if you experience any of the following: ? Bleeding from an injury or your nose that does not stop. ? Unusual colored urine (red or dark brown) or unusual colored stools (red or  black). ? Unusual bruising for unknown reasons. ? A serious fall or if you hit your head (even if there is no bleeding).  Some medicines may interact with Xarelto and might increase your risk of bleeding while on Xarelto. To help avoid this, consult your healthcare provider or pharmacist prior to using any new prescription or non-prescription medications, including herbals, vitamins, non-steroidal anti-inflammatory drugs (NSAIDs) and supplements.  This website has more information on Xarelto: [email or web address]

## 2016-12-18 NOTE — Progress Notes (Signed)
Progress Note  Patient Name: Nicholas Caldwell Date of Encounter: 12/18/2016  Primary Cardiologist: Heart failure [name])  Subjective   Doing better and has no compliants. Reports using continuous 3L home O2.   Inpatient Medications    Scheduled Meds: . amoxicillin-clavulanate  1 tablet Oral Q12H  . diltiazem  30 mg Oral Q6H  . insulin aspart  0-15 Units Subcutaneous TID AC & HS  . mouth rinse  15 mL Mouth Rinse BID  . rivaroxaban  20 mg Oral Q supper  . rosuvastatin  20 mg Oral Daily  . torsemide  40 mg Oral Daily   Continuous Infusions:  PRN Meds: sodium chloride, metoprolol   Vital Signs    Vitals:   12/17/16 2011 12/17/16 2347 12/18/16 0604 12/18/16 0748  BP: (!) 148/61 136/72 135/68 127/72  Pulse: 90 81 84 76  Resp: _0 Temp: 99 F (37.2 C) 98.3 F (36.8 C) 98.4 F (36.9 C) 98.2 F (36.8 C)  TempSrc: Oral Oral Oral Oral  SpO2: 94% 100% 99% 98%  Weight:   255 lb 3.2 oz (115.8 kg)   Height:        Intake/Output Summary (Last 24 hours) at 12/18/16 0821 Last data filed at 12/18/16 0725  Gross per 24 hour  Intake              880 ml  Output             2560 ml  Net            -1680 ml   Filed Weights   12/17/16 0500 12/17/16 1701 12/18/16 0604  Weight: 256 lb 9.9 oz (116.4 kg) 251 lb 11.2 oz (114.2 kg) 255 lb 3.2 oz (115.8 kg)    Telemetry   Intermittently in A-fib, rate controlled - Personally Reviewed  ECG    Aflutter, HR 84, QTc 527 - Personally Reviewed  Physical Exam   GEN: No acute distress.   Neck: No JVD Cardiac: NSR, no murmurs, rubs, or gallops.  Respiratory: Clear to auscultation bilaterally. GI: Soft, nontender, non-distended  MS: No edema; No deformity. Neuro:  Nonfocal  Psych: Normal affect   Labs    Chemistry Recent Labs Lab 12/13/16 0253  12/15/16 1358 12/16/16 0538 12/17/16 0218  NA 142  < > 142 143 140  K 3.6  < > 3.6 3.7 3.9  CL 97*  < > 99* 102 96*  CO2 34*  < > 27 32 33*  GLUCOSE 318*  < > 157* 173*  186*  BUN <5*  < > _1 CREATININE 0.76  < > 1.13 0.96 0.87  CALCIUM 8.3*  < > 8.6* 8.2* 8.2*  PROT 7.4  --   --   --   --   ALBUMIN 2.9*  --   --   --   --   AST 37  --   --   --   --   ALT 17  --   --   --   --   ALKPHOS 124  --   --   --   --   BILITOT 0.6  --   --   --   --   GFRNONAA >60  < > >60 >60 >60  GFRAA >60  < > >60 >60 >60  ANIONGAP 11  < > 16* 9 11  < > = values in this interval not displayed.   Hematology  Recent Labs Lab 12/15/16 0157  12/16/16 0538 12/17/16 0218  WBC 9.9 9.2 11.1*  RBC 3.98* 4.08* 4.44  HGB 10.0* 10.3* 11.4*  HCT 32.4* 33.8* 37.9*  MCV 81.4 82.8 85.4  MCH 25.1* 25.2* 25.7*  MCHC 30.9 30.5 30.1  RDW 16.6* 16.6* 16.7*  PLT 408* 419* 407*    Cardiac Enzymes  Recent Labs Lab 12/13/16 0253 12/13/16 1353  TROPONINI 0.03* 0.06*     Recent Labs Lab 12/13/16 0305  TROPIPOC 0.03     BNP  Recent Labs Lab 12/13/16 0253  BNP 555.1*     DDimer No results for input(s): DDIMER in the last 168 hours.   Radiology    No results found.  Cardiac Studies   No significant CAD by 2016 cath; EF 40%  Patient Profile     71 y.o. male with resp failure, AFib  Assessment & Plan    1) AFib: Intermittently going into Afib on tele; rate controlled now. HR in the 70s-90s overnight. BP stable.   -D/c cardizem 30 mg q6 hours -Restart home carvedilol 12.5 mg BID -Continue Xarelto for stroke prevention.   2) Needs EP f/u for possible long QT syndrome, per Dr. Caryl Comes.  WIll also need f/u in CHF clinic ([facility]) that was scheduled before he became ill.  He would like to have that appt in hand before leaving the hospital.  3) Chronic systolic heart failure: Restarted low dose Demadex.  Cr stable. Diuresed -1.4 L in the past 24 hrs. Net -17L since admission and weight down by 19 lbs.   Signed, Shela Leff, MD  12/18/2016, 8:21 AM    I have examined the patient and reviewed assessment and plan and discussed with patient.   Agree with above as stated.  Reviewed tele.  He is going in and out of sinus rhythm.  Then there is clear atrial flutter.  Since he has a low EF, beta blocker would be best choice.  Stop Cardizem and start Coreg 12.5 BID- home dose.  Resp status is better.   Larae Grooms

## 2016-12-19 ENCOUNTER — Other Ambulatory Visit: Payer: Self-pay

## 2016-12-19 DIAGNOSIS — I5043 Acute on chronic combined systolic (congestive) and diastolic (congestive) heart failure: Secondary | ICD-10-CM

## 2016-12-19 LAB — GLUCOSE, CAPILLARY
Glucose-Capillary: 139 mg/dL — ABNORMAL HIGH (ref 65–99)
Glucose-Capillary: 205 mg/dL — ABNORMAL HIGH (ref 65–99)

## 2016-12-19 LAB — BASIC METABOLIC PANEL
Anion gap: 11 (ref 5–15)
BUN: 12 mg/dL (ref 6–20)
CO2: 32 mmol/L (ref 22–32)
CREATININE: 0.92 mg/dL (ref 0.61–1.24)
Calcium: 8.4 mg/dL — ABNORMAL LOW (ref 8.9–10.3)
Chloride: 94 mmol/L — ABNORMAL LOW (ref 101–111)
GFR calc non Af Amer: >60 mL/min (ref >60)
GLUCOSE: 133 mg/dL — AB (ref 65–99)
Potassium: 3.3 mmol/L — ABNORMAL LOW (ref 3.5–5.1)
Sodium: 137 mmol/L (ref 135–145)

## 2016-12-19 LAB — CBC
HEMATOCRIT: 35.4 % — AB (ref 39.0–52.0)
Hemoglobin: 10.8 g/dL — ABNORMAL LOW (ref 13.0–17.0)
MCH: 25.4 pg — ABNORMAL LOW (ref 26.0–34.0)
MCHC: 30.5 g/dL (ref 30.0–36.0)
MCV: 83.3 fL (ref 78.0–100.0)
Platelets: 451 10*3/uL — ABNORMAL HIGH (ref 150–400)
RBC: 4.25 MIL/uL (ref 4.22–5.81)
RDW: 16 % — AB (ref 11.5–15.5)
WBC: 8.7 10*3/uL (ref 4.0–10.5)

## 2016-12-19 MED ORDER — INSULIN DETEMIR 100 UNIT/ML ~~LOC~~ SOLN
10.0000 [IU] | Freq: Every evening | SUBCUTANEOUS | 1 refills | Status: DC
Start: 2016-12-19 — End: 2017-10-22

## 2016-12-19 MED ORDER — GABAPENTIN 600 MG PO TABS: 600.0000 mg | ORAL_TABLET | Freq: Two times a day (BID) | ORAL | 0 refills | Status: DC

## 2016-12-19 MED ORDER — RIVAROXABAN 20 MG PO TABS: 20.0000 mg | ORAL_TABLET | Freq: Every day | ORAL | 0 refills | Status: DC

## 2016-12-19 MED ORDER — DIAZEPAM 10 MG PO TABS: 5.0000 mg | ORAL_TABLET | Freq: Every day | ORAL | 0 refills | Status: DC | PRN

## 2016-12-19 MED ORDER — AMOXICILLIN-POT CLAVULANATE 875-125 MG PO TABS: 1.0000 | ORAL_TABLET | Freq: Two times a day (BID) | ORAL | 0 refills | Status: DC

## 2016-12-19 MED ORDER — IPRATROPIUM BROMIDE 0.02 % IN SOLN: 0.5000 mg | Freq: Four times a day (QID) | RESPIRATORY_TRACT | 1 refills | Status: DC | PRN

## 2016-12-19 MED ORDER — TORSEMIDE 20 MG PO TABS: 40.0000 mg | ORAL_TABLET | Freq: Every day | ORAL | 0 refills | Status: DC

## 2016-12-19 NOTE — Progress Notes (Signed)
Physical Therapy Treatment Patient Details Name: Nicholas Caldwell MRN: [id number] DOB: 1946/04/17 Today's Date: 12/19/2016    History of Present Illness pt presents with respiratory failure, Hypoxemia, A-fib s/p Cardioversion.  pt with hx of A-fib, COPD, CHF, CAD, DM, HTN, Pulmonary HTN, and Chronic Pain.      PT Comments    Patient continues to progress toward mobility goals and tolerated increased gait distance this session. SpO2 86-90% on 3L O2 with ambulation. Continue to progress as tolerated with anticipated d/c home with HHPT.   Follow Up Recommendations  Home health PT;Supervision/Assistance - 24 hour     Equipment Recommendations  None recommended by PT    Recommendations for Other Services       Precautions / Restrictions Precautions Precautions: Fall Restrictions Weight Bearing Restrictions: No    Mobility  Bed Mobility Overal bed mobility: Modified Independent Bed Mobility: Sit to Supine           General bed mobility comments: increased time/effort to return to supine; pt up at sink with daughter upon arrival  Transfers Overall transfer level: Needs assistance Equipment used: Rolling walker (2 wheeled) Transfers: Sit to/from Stand Sit to Stand: Min guard         General transfer comment: min guard for safety; cues for safe hand placement  Ambulation/Gait Ambulation/Gait assistance: Supervision Ambulation Distance (Feet): 180 Feet Assistive device: Rolling walker (2 wheeled) Gait Pattern/deviations: Step-through pattern;Decreased stride length;Trunk flexed Gait velocity: decreased   General Gait Details: cues for upright posture; pt reported "it's a habit" to lean over due to chronic LBP;  SpO2 86-90% with mobility on 3L O2 via [place]; up to 98% at rest end of session; discussed getting pulse ox for home use and breathing technique; wife and daugther present   Science writer    Modified Rankin (Stroke Patients Only)        Balance Overall balance assessment: Needs assistance Sitting-balance support: No upper extremity supported;Feet supported Sitting balance-Leahy Scale: Good     Standing balance support: Bilateral upper extremity supported;Single extremity supported;During functional activity Standing balance-Leahy Scale: Poor                      Cognition Arousal/Alertness: Awake/alert Behavior During Therapy: WFL for tasks assessed/performed Overall Cognitive Status: Within Functional Limits for tasks assessed                      Exercises      General Comments        Pertinent Vitals/Pain Pain Assessment: Faces Faces Pain Scale: Hurts a little bit Pain Location: R hip Pain Descriptors / Indicators: Sore Pain Intervention(s): Monitored during session    Home Living                      Prior Function            PT Goals (current goals can now be found in the care plan section) Acute Rehab PT Goals Patient Stated Goal: Home PT Goal Formulation: With patient Time For Goal Achievement: 12/31/16 Potential to Achieve Goals: Good Progress towards PT goals: Progressing toward goals    Frequency    Min 3X/week      PT Plan Current plan remains appropriate    Co-evaluation             End of Session Equipment Utilized During Treatment: Gait belt;Oxygen Activity Tolerance:  Patient tolerated treatment well Patient left: with call bell/phone within reach;with family/visitor present;in bed     Time: 5015-8682 PT Time Calculation (min) (ACUTE ONLY): 24 min  Charges:  $Gait Training: 8-22 mins $Therapeutic Activity: 8-22 mins                    G Codes:      Salina April, PTA Pager: [phone]   12/19/2016, 4:06 PM

## 2016-12-19 NOTE — Progress Notes (Addendum)
Progress Note  Patient Name: Nicholas Caldwell Date of Encounter: 12/19/2016  Primary Cardiologist: Heart failure [name])  Subjective   Doing better and has no compliants. Reports using continuous 3L home O2.  Stopped smoking while in the hospital.  Inpatient Medications    Scheduled Meds: . amoxicillin-clavulanate  1 tablet Oral Q12H  . carvedilol  12.5 mg Oral BID WC  . insulin aspart  0-15 Units Subcutaneous TID AC & HS  . mouth rinse  15 mL Mouth Rinse BID  . multivitamin with minerals  1 tablet Oral Daily  . protein supplement shake  11 oz Oral Q24H  . rivaroxaban  20 mg Oral Q supper  . rosuvastatin  20 mg Oral Daily  . torsemide  40 mg Oral Daily   Continuous Infusions:  PRN Meds: sodium chloride, metoprolol   Vital Signs    Vitals:   12/18/16 1535 12/18/16 2000 12/18/16 2334 12/19/16 0551  BP: 105/62 105/65 139/73 114/64  Pulse: 70 71 83 64  Resp: (!) _0 Temp: 98.2 F (36.8 C) 98.7 F (37.1 C) 97.5 F (36.4 C) 98.4 F (36.9 C)  TempSrc: Oral Oral Oral Oral  SpO2: 100% 100% 100% 96%  Weight:    253 lb 11.2 oz (115.1 kg)  Height:        Intake/Output Summary (Last 24 hours) at 12/19/16 1325 Last data filed at 12/19/16 1200  Gross per 24 hour  Intake             1480 ml  Output             3175 ml  Net            -1695 ml   Filed Weights   12/17/16 1701 12/18/16 0604 12/19/16 0551  Weight: 251 lb 11.2 oz (114.2 kg) 255 lb 3.2 oz (115.8 kg) 253 lb 11.2 oz (115.1 kg)    Telemetry   Intermittently in A-fib, rate controlled - Personally Reviewed  ECG    Aflutter, HR 84, QTc 527 - Personally Reviewed  Physical Exam   GEN: No acute distress.   Neck: No JVD Cardiac: NSR, no murmurs, rubs, or gallops.  Respiratory: Clear to auscultation bilaterally. GI: Soft, nontender, non-distended  MS: No edema; No deformity. Neuro:  Nonfocal  Psych: Normal affect   Labs    Chemistry Recent Labs Lab 12/13/16 0253  12/16/16 0538  12/17/16 0218 12/19/16 0511  NA 142  < > 143 140 137  K 3.6  < > 3.7 3.9 3.3*  CL 97*  < > 102 96* 94*  CO2 34*  < > 32 33* 32  GLUCOSE 318*  < > 173* 186* 133*  BUN <5*  < > _1 CREATININE 0.76  < > 0.96 0.87 0.92  CALCIUM 8.3*  < > 8.2* 8.2* 8.4*  PROT 7.4  --   --   --   --   ALBUMIN 2.9*  --   --   --   --   AST 37  --   --   --   --   ALT 17  --   --   --   --   ALKPHOS 124  --   --   --   --   BILITOT 0.6  --   --   --   --   GFRNONAA >60  < > >60 >60 >60  GFRAA >60  < > >60 >60 >60  ANIONGAP  11  < > _0 < > = values in this interval not displayed.   Hematology  Recent Labs Lab 12/16/16 0538 12/17/16 0218 12/19/16 0511  WBC 9.2 11.1* 8.7  RBC 4.08* 4.44 4.25  HGB 10.3* 11.4* 10.8*  HCT 33.8* 37.9* 35.4*  MCV 82.8 85.4 83.3  MCH 25.2* 25.7* 25.4*  MCHC 30.5 30.1 30.5  RDW 16.6* 16.7* 16.0*  PLT 419* 407* 451*    Cardiac Enzymes  Recent Labs Lab 12/13/16 0253 12/13/16 1353  TROPONINI 0.03* 0.06*     Recent Labs Lab 12/13/16 0305  TROPIPOC 0.03     BNP  Recent Labs Lab 12/13/16 0253  BNP 555.1*     DDimer No results for input(s): DDIMER in the last 168 hours.   Radiology    No results found.  Cardiac Studies   No significant CAD by 2016 cath; EF 40%  Patient Profile     71 y.o. male with resp failure, AFib  Assessment & Plan    1) AFib: Now in NSR. -Continue carvedilol 12.5 mg BID -Continue Xarelto for stroke prevention.   2) Needs EP f/u for possible long QT syndrome, per Dr. Caryl Comes.  Will also need f/u in CHF clinic ([facility]) ; scheduled for 2/26.     3) Chronic systolic heart failure: Restarted low dose Demadex.  Cr stable.   4) Tobacco abuse: Concerned about abruptly stopping smoking.  I reassured him it was ok to stop smoking like this.  He would consider Chantix in the future if he reverted to smoking.   Discussed at length with patient's daughter.  Signed, Larae Grooms, MD  12/19/2016, 1:25 PM

## 2016-12-19 NOTE — Progress Notes (Signed)
Patient with no complaints or concerns during 7pm - 7am shift. Pt alert and oriented , slept majority of the night.   Keshona Kartes, RN

## 2016-12-19 NOTE — Patient Outreach (Signed)
    Per EPIC, patient is being discharged from the acute care setting today. This RNCM will attempt contact with patient within the next 72 hours.for transition of care.

## 2016-12-19 NOTE — Progress Notes (Signed)
Pt's missed CHF appt (due to being hospital) was r/s to 12/29/16 at 2pm, info on AVS now. Dayna Dunn PA-C

## 2016-12-19 NOTE — Evaluation (Signed)
Occupational Therapy Evaluation and Discharge Patient Details Name: Sammie Denner MRN: [id number] DOB: Dec 26, 1945 Today's Date: 12/19/2016    History of Present Illness pt presents with respiratory failure, Hypoxemia, A-fib s/p Cardioversion.  pt with hx of A-fib, COPD, CHF, CAD, DM, HTN, Pulmonary HTN, and Chronic Pain.     Clinical Impression   Pt with baseline dependence on 3L 02, RW and assist for LB ADL and all IADL. Pt likely very near his baseline. Pt and wife are aware of AE for LB ADL and benefits of tub transfer bench to decrease risk of fall with tub transfer. Pt has had HHOT following his last admission. No further OT needs.    Follow Up Recommendations  No OT follow up;Supervision/Assistance - 24 hour    Equipment Recommendations  None recommended by OT    Recommendations for Other Services       Precautions / Restrictions Precautions Precautions: Fall Restrictions Weight Bearing Restrictions: No      Mobility Bed Mobility Overal bed mobility: Needs Assistance Bed Mobility: Supine to Sit     Supine to sit: Supervision     General bed mobility comments: increased time, no physical assist  Transfers Overall transfer level: Needs assistance Equipment used: Rolling walker (2 wheeled) Transfers: Sit to/from [facility] Sit to Stand: Min assist Stand pivot transfers: Min assist       General transfer comment: increased time, steadying assist    Balance   Sitting-balance support: No upper extremity supported;Feet supported Sitting balance-Leahy Scale: Good       Standing balance-Leahy Scale: Poor                              ADL Overall ADL's : Needs assistance/impaired Eating/Feeding: Independent;Sitting   Grooming: Wash/dry hands;Wash/dry face;Sitting;Set up   Upper Body Bathing: Minimal assistance;Sitting   Lower Body Bathing: Sit to/from stand;Moderate assistance   Upper Body Dressing : Set up;Sitting    Lower Body Dressing: Sit to/from stand;Moderate assistance   Toilet Transfer: Minimal assistance;Stand-pivot;RW   Toileting- Clothing Manipulation and Hygiene: Minimal assistance;Sit to/from stand         General ADL Comments: Educated in availability of tub transfer bench and AE for LB ADL. Wife is aware that insurance does not cover.     Vision     Perception     Praxis      Pertinent Vitals/Pain Pain Assessment: Faces Faces Pain Scale: Hurts a little bit Pain Location: R hip Pain Descriptors / Indicators: Sore Pain Intervention(s): Monitored during session;Repositioned     Hand Dominance Right   Extremity/Trunk Assessment Upper Extremity Assessment Upper Extremity Assessment: Overall WFL for tasks assessed;Generalized weakness   Lower Extremity Assessment Lower Extremity Assessment: Defer to PT evaluation RLE Deficits / Details: pt indicates R hip pain due to arthritis.   Cervical / Trunk Assessment Cervical / Trunk Assessment: Normal   Communication Communication Communication: No difficulties   Cognition Arousal/Alertness: Awake/alert Behavior During Therapy: Flat affect Overall Cognitive Status: Impaired/Different from baseline Area of Impairment: Problem solving             Problem Solving: Slow processing;Decreased initiation;Difficulty sequencing;Requires verbal cues General Comments: pt with chronic R hip pain   General Comments       Exercises       Shoulder Instructions      Home Living Family/patient expects to be discharged to:: Private residence Living Arrangements: Spouse/significant other Available Help at Discharge:  Family;Available 24 hours/day Type of Home: House Home Access: Stairs to enter [employer] of Steps: 1 Entrance Stairs-Rails: None Home Layout: One level     Bathroom Shower/Tub: Tub/shower unit Shower/tub characteristics: Curtain Biochemist, clinical: Standard     Home Equipment: Environmental consultant - 2  wheels;Cane - single point;Bedside commode;Shower seat (02)   Additional Comments: On 3L of O2 at all times at home      Prior Functioning/Environment Level of Independence: Needs assistance  Gait / Transfers Assistance Needed: Uses RW or SPC ADL's / Homemaking Assistance Needed: Assist from wife with bathing and lower body dressing and all IADL            OT Problem List: Decreased strength;Decreased activity tolerance;Impaired balance (sitting and/or standing);Decreased cognition;Decreased knowledge of use of DME or AE;Cardiopulmonary status limiting activity;Pain   OT Treatment/Interventions: Self-care/ADL training;DME and/or AE instruction;Therapeutic activities;Cognitive remediation/compensation;Patient/family education;Balance training    OT Goals(Current goals can be found in the care plan section) Acute Rehab OT Goals Patient Stated Goal: Home OT Goal Formulation: With patient Time For Goal Achievement: 01/02/17 Potential to Achieve Goals: Good  OT Frequency: Min 2X/week   Barriers to D/C:            Co-evaluation              End of Session Equipment Utilized During Treatment: Gait belt;Rolling walker;Oxygen (4L)  Activity Tolerance: Patient tolerated treatment well Patient left: in bed;with call bell/phone within reach;with family/visitor present   Time: 0812-0828 OT Time Calculation (min): 16 min Charges:  OT General Charges $OT Visit: 1 Procedure OT Evaluation $OT Eval Moderate Complexity: 1 Procedure G-Codes:    Malka So 12/19/2016, 8:39 AM  [phone]

## 2016-12-19 NOTE — Progress Notes (Signed)
Nebulizer machine ordered and to delivered to the room today prior to [place] home; B [place] RN,MHA,BSN [phone]

## 2016-12-19 NOTE — Discharge Summary (Signed)
Physician Discharge Summary  Nicholas Caldwell XBL:[id number] DOB: 09-04-1946 DOA: 12/13/2016  PCP: [place] date: 12/13/2016 Discharge date: 12/19/2016  Recommendations for Outpatient Follow-up:  1. Pt will need to follow up with PCP in 1-2 weeks post discharge 2. Please obtain BMP to evaluate electrolytes and kidney function, K level  3. K supplemented prior to discharge  4. Please also check CBC to evaluate Hg and Hct levels 5. Please note changes in medications outlined below 6. Due to pt's prolonged QT, Amiodarone and Digoxin stopped 7. Due to soft BP, ENtresto, Spironolactone stopped  8. Pt gave appointment time and date to follow up with cardiologist 9. Pt also to complete ABX therapy outlined below   Discharge Diagnoses:  Active Problems:   Acute respiratory failure (HCC)   Torsades de pointes (HCC)   Acute respiratory failure with hypoxia and hypercapnia (HCC)   Acute pulmonary edema (HCC)   Acute encephalopathy   Atrial fibrillation with RVR (HCC)   Cardiogenic shock (HCC)   SVT (supraventricular tachycardia) (HCC)  Discharge Condition: Stable  Diet recommendation: Heart healthy diet discussed in details   History of present illness:  Brief Narrative:  70M with PMH significant for CAD, systolic and diastolic CHF (EF 30-07%), DMII, HLD, HTN, paroxysmal a fib/flutter, pHTN, COPD and tobacco abuse admitted 2/10 with complaints of SOB and flu-like symptoms. Work up consistent with hypoxic / hypercarbic respiratory failure. Failed bipap with ABG of 7.15 / 109 / 87.   SIGNIFICANT EVENTS: 2/10 Admit hypoxic/hypercarbic resp fx, failed bipap >intubated  2/11 Weaned on PSV then developed AF requiring cardioversion   Assessment & Plan:  Acute on chronic hypoxemic/hypercapneic respiratory failure 2/2 acute decompensated mixed CHF +/- viral syndrome Underlying COPD (no PFTs on file), without overt acute exacerbation Pulmonary HTN (RVSP 57 on last  TTE) - extubated 2/14, respiratory status stable this AM  - CXR consistent with LLL infiltrate Vs. atelectasis  - continue with Augmentin   A fib/flutter with RVR - on xarelto, dig, amio at home, now resolved  Acute decompensated CHF (EF 35-40%) Episode of torsades 2/10 with spontaneous resolution Prolonged qt interval  Refractory ventricular rhythm  - Remains in Afib on EKG  - Diuresis per Cardiology  - Keep K >4, Mg >2 - Continue xarelto - Continue crestor   AKI, resolved  Hypokalemia, resolved Hypophosphatemia, resolved  - resolved and needs outpatient follow up   Diarrhea  - resolved   Anemia - mild, no acute bleeding Chronic a/c for a fib/flutter - Continue home xarelto   Possible viral syndrome; doubt bacterial infxn - Flu negative  - continue Augmentin   IDDM  - resume home medical regimen   ICU associated pain / discomfort  - resolved   Obesity - Body mass index is 34.61 kg/m.  DVT prophylaxis: Rivaroxaban Code Status: Full Family Communication: Patient and wife at bedside  Disposition Plan: [facility]   STUDIES:  ECHO 2/10 >> mild LVH, mild reduction in systolic function, LVEF 62-26%, diffuse hypokinesis, LA moderately dilated, PA peak 56 mmHg  CXR 2/13>>Left lower lobe atelectasis and consolidation + pulmonary edema   CULTURES:  Influenza 2/10 >> negative  BCx2 2/10 >>NGTD   Tracheal Aspirate 2/10 >>Consistent with normal respiratory flora.  Procedures/Studies: Dg Chest Port 1 View  Result Date: 12/16/2016 CLINICAL DATA:  Respiratory failure. EXAM: PORTABLE CHEST 1 VIEW COMPARISON:  12/15/2016. FINDINGS: Endotracheal tube, NG tube in stable position. Cardiomegaly with bilateral pulmonary interstitial prominence. Left  lower lobe atelectasis and consolidation. Small left pleural effusion. No pneumothorax. IMPRESSION: 1. Lines and tubes in stable position. 2. Cardiomegaly with diffuse bilateral  pulmonary interstitial prominence and left pleural effusion consistent with congestive heart failure. 3. Left lower lobe atelectasis and consolidation. Left lower lobe pneumonia cannot be excluded. Electronically Signed   By: Marcello Moores  Register   On: 12/16/2016 06:52   Dg Chest Port 1 View  Result Date: 12/15/2016 CLINICAL DATA:  Endotracheal tube position EXAM: PORTABLE CHEST 1 VIEW COMPARISON:  12/14/2016 FINDINGS: Endotracheal tube in good position.  NG tube in the stomach. Diffuse bilateral airspace disease without significant change. Bibasilar atelectasis. Small pleural effusions bilaterally. IMPRESSION: Endotracheal tube remains in good position Diffuse bilateral airspace disease unchanged, probable edema versus pneumonia. Bibasilar atelectasis and small effusions unchanged. Electronically Signed   By: Franchot Gallo M.D.   On: 12/15/2016 06:45   Dg Chest Port 1 View  Result Date: 12/14/2016 CLINICAL DATA:  Abnormal respirations. Hypertension. Chronic systolic heart failure. EXAM: PORTABLE CHEST 1 VIEW COMPARISON:  12/13/2016. FINDINGS: Cardiomegaly. BILATERAL pulmonary opacities favored to represent pulmonary edema appears slightly improved from yesterday's radiograph. ET tube remains 5.8 cm above carina. Enteric tube tip lies below the diaphragm. No osseous findings. IMPRESSION: Cardiomegaly with BILATERAL pulmonary opacities appears slightly improved, possible clearing edema. Electronically Signed   By: Staci Righter M.D.   On: 12/14/2016 07:32   Dg Chest Portable 1 View  Result Date: 12/13/2016 CLINICAL DATA:  71 y/o  M; tube change. EXAM: PORTABLE CHEST 1 VIEW COMPARISON:  12/13/2016 chest radiograph FINDINGS: Lung bases and diaphragm are excluded from the field of view. Endotracheal tube is 5.3 cm from the carina. Cardiomegaly. Diffuse pulmonary edema. Enteric tube tip extends below the field of view. IMPRESSION: Lung bases and diaphragm are excluded from the field of view. Endotracheal tube is  5.3 cm from the carina. Cardiomegaly. Diffuse pulmonary edema. Enteric tube tip extends below the field of view. Electronically Signed   By: Kristine Garbe M.D.   On: 12/13/2016 05:27   Dg Chest Portable 1 View  Result Date: 12/13/2016 CLINICAL DATA:  Intubation EXAM: PORTABLE CHEST 1 VIEW COMPARISON:  12/13/2016 FINDINGS: Endotracheal tube tip is approximately 5.1 cm superior to the carina. Continued cardiomegaly with central vascular congestion can moderate diffuse pulmonary edema. Consolidation at the left lung base. Cannot exclude small left pleural effusion. No pneumothorax. Non inclusion of the right CP angle. IMPRESSION: Endotracheal tube tip is approximately 5.1 cm superior to the carina. Continued cardiomegaly with central vascular congestion and moderate diffuse pulmonary edema. Probable small left pleural effusion. Electronically Signed   By: Donavan Foil M.D.   On: 12/13/2016 03:41   Dg Chest Portable 1 View  Result Date: 12/13/2016 CLINICAL DATA:  Acute onset shortness of breath. Flu-like symptoms for a few days. Low oxygen saturation. EXAM: PORTABLE CHEST 1 VIEW COMPARISON:  10/31/2016 FINDINGS: Cardiac enlargement with pulmonary vascular congestion. Diffuse bilateral airspace infiltration in the lungs, progressing since prior study. This consistent with edema. Small bilateral pleural effusions. No pneumothorax. Bilateral cervical ribs. IMPRESSION: Progressing congestive changes in the heart and lungs with cardiac enlargement, pulmonary vascular congestion, bilateral airspace edema, and small pleural effusions. Electronically Signed   By: Lucienne Capers M.D.   On: 12/13/2016 03:14     Discharge Exam: Vitals:   12/18/16 2334 12/19/16 0551  BP: 139/73 114/64  Pulse: 83 64  Resp: 18 18  Temp: 97.5 F (36.4 C) 98.4 F (36.9 C)   Vitals:  12/18/16 1535 12/18/16 2000 12/18/16 2334 12/19/16 0551  BP: 105/62 105/65 139/73 114/64  Pulse: 70 71 83 64  Resp: (!) _0 Temp: 98.2 F (36.8 C) 98.7 F (37.1 C) 97.5 F (36.4 C) 98.4 F (36.9 C)  TempSrc: Oral Oral Oral Oral  SpO2: 100% 100% 100% 96%  Weight:    115.1 kg (253 lb 11.2 oz)  Height:        General: Pt is alert, follows commands appropriately, not in acute distress Cardiovascular: Regular rate and rhythm, no rubs, no gallops Respiratory: Clear to auscultation bilaterally, no wheezing, no crackles, no rhonchi Abdominal: Soft, non tender, non distended, bowel sounds +, no guarding  Discharge Instructions  Discharge Instructions    AMB Referral to [facility] Care Management    Complete by:  As directed    Reason for consult:  Please follow for transition of care   Diagnoses of:  Heart Failure   Expected date of contact:  1-3 days (reserved for hospital discharges)   Please assign to community nurse for transition of care calls and assess for home visits.  PLEASE NOTE, patient has had multiple admissions.   Questions please call:   Natividad Brood, RN BSN [facility] Liaison  [phone] business mobile phone Toll free office [phone]     Allergies as of 12/19/2016   No Known Allergies     Medication List    STOP taking these medications   amiodarone 400 MG tablet Commonly known as:  PACERONE   digoxin 0.125 MG tablet Commonly known as:  LANOXIN   glipiZIDE 10 MG tablet Commonly known as:  GLUCOTROL   methadone 10 MG tablet Commonly known as:  DOLOPHINE   PRESCRIPTION MEDICATION   sacubitril-valsartan 49-51 MG Commonly known as:  ENTRESTO   spironolactone 25 MG tablet Commonly known as:  ALDACTONE     TAKE these medications   albuterol 108 (90 Base) MCG/ACT inhaler Commonly known as:  PROVENTIL HFA;VENTOLIN HFA Inhale 2 puffs into the lungs every 6 (six) hours as needed for wheezing or shortness of breath.   amoxicillin-clavulanate 875-125 MG tablet Commonly known as:  AUGMENTIN Take 1 tablet by mouth every 12 (twelve) hours.   carvedilol  12.5 MG tablet Commonly known as:  COREG Take 1 tablet (12.5 mg total) by mouth 2 (two) times daily with a meal.   DELSYM 30 MG/5ML liquid Generic drug:  dextromethorphan Take 30 mg by mouth as needed for cough.   diazepam 10 MG tablet Commonly known as:  VALIUM Take 0.5 tablets (5 mg total) by mouth daily as needed for anxiety or sleep.   diclofenac sodium 1 % Gel Commonly known as:  VOLTAREN Apply 2 g topically 2 (two) times daily as needed (for pain).   DULERA 200-5 MCG/ACT Aero Generic drug:  mometasone-formoterol Inhale 2 puffs into the lungs 2 (two) times daily.   fluticasone 50 MCG/ACT nasal spray Commonly known as:  FLONASE Place 2 sprays into both nostrils daily as needed for allergies.   gabapentin 600 MG tablet Commonly known as:  NEURONTIN Take 1 tablet (600 mg total) by mouth 2 (two) times daily. What changed:  how much to take  when to take this  additional instructions  Another medication with the same name was removed. Continue taking this medication, and follow the directions you see here.   glucose 4 GM chewable tablet Chew 4 tablets by mouth See admin instructions. Chew 4 tablets by mouth as needed  for low blood sugar - repeat every 15 minutes if blood sugar less than 70   hydrocortisone 1 % lotion Apply 1 application topically See admin instructions. Apply small amount to back twice daily for itchy rash   hydrocortisone 2.5 % rectal cream Commonly known as:  ANUSOL-HC Place 1 application rectally 2 (two) times daily as needed for hemorrhoids or itching. Use up to 2 weeks at a time as needed   insulin detemir 100 UNIT/ML injection Commonly known as:  LEVEMIR Inject 0.1 mLs (10 Units total) into the skin every evening. What changed:  how much to take   ipratropium 0.02 % nebulizer solution Commonly known as:  ATROVENT Take 2.5 mLs (0.5 mg total) by nebulization every 6 (six) hours as needed for wheezing or shortness of breath.   magnesium  oxide 400 (241.3 Mg) MG tablet Commonly known as:  MAG-OX Take 1 tablet (400 mg total) by mouth 2 (two) times daily.   metFORMIN 500 MG 24 hr tablet Commonly known as:  GLUCOPHAGE-XR Take 1,000 mg by mouth 2 (two) times daily with a meal.   potassium chloride SA 20 MEQ tablet Commonly known as:  K-DUR,KLOR-CON Take 2 tablets (40 mEq total) by mouth daily.   rivaroxaban 20 MG Tabs tablet Commonly known as:  XARELTO Take 1 tablet (20 mg total) by mouth daily with supper.   rosuvastatin 40 MG tablet Commonly known as:  CRESTOR Take 20 mg by mouth daily. For cholesterol   torsemide 20 MG tablet Commonly known as:  DEMADEX Take 2 tablets (40 mg total) by mouth daily. What changed:  how much to take      Follow-up Information    [facility] Follow up.   Specialty:  Cardiology Why:  Your appointment has been rescheduled to 12/29/16 at 2pm - garage code is [id number]. Contact information: [address] [id number] mc [place] [phone]       [facility] Follow up.   Contact information: [place] [address] [phone]            The results of significant diagnostics from this hospitalization (including imaging, microbiology, ancillary and laboratory) are listed below for reference.     Microbiology: Recent Results (from the past 240 hour(s))  Culture, blood (routine x 2)     Status: None   Collection Time: 12/13/16  3:10 AM  Result Value Ref Range Status   Specimen Description BLOOD RIGHT HAND  Final   Special Requests AEROBIC BOTTLE ONLY 5ML  Final   Culture NO GROWTH 5 DAYS  Final   Report Status 12/18/2016 FINAL  Final  Culture, blood (routine x 2)     Status: None   Collection Time: 12/13/16  3:26 AM  Result Value Ref Range Status   Specimen Description BLOOD RIGHT HAND  Final   Special Requests AEROBIC BOTTLE ONLY 3ML  Final   Culture  NO GROWTH 5 DAYS  Final   Report Status 12/18/2016 FINAL  Final  MRSA PCR Screening     Status: None   Collection Time: 12/13/16  5:47 AM  Result Value Ref Range Status   MRSA by PCR NEGATIVE NEGATIVE Final    Comment:        The GeneXpert MRSA Assay (FDA approved for NASAL specimens only), is one component of a comprehensive MRSA colonization surveillance program. It is not intended to diagnose MRSA infection nor to guide or monitor treatment for  MRSA infections.   Culture, respiratory (tracheal aspirate)     Status: None   Collection Time: 12/13/16  8:51 PM  Result Value Ref Range Status   Specimen Description TRACHEAL ASPIRATE  Final   Special Requests NONE  Final   Gram Stain   Final    FEW WBC PRESENT, PREDOMINANTLY PMN FEW SQUAMOUS EPITHELIAL CELLS PRESENT RARE GRAM POSITIVE COCCOBACILLUS RARE GRAM VARIABLE ROD    Culture Consistent with normal respiratory flora.  Final   Report Status 12/16/2016 FINAL  Final     Labs: Basic Metabolic Panel:  Recent Labs Lab 12/15/16 0157 12/15/16 1358 12/16/16 0538 12/16/16 1700 12/17/16 0218 12/17/16 0814 12/19/16 0511  NA 142 142 143  --  140  --  137  K 3.9 3.6 3.7  --  3.9  --  3.3*  CL 102 99* 102  --  96*  --  94*  CO2 30 27 32  --  33*  --  32  GLUCOSE 115* 157* 173*  --  186*  --  133*  BUN _0 --  12  --  12  CREATININE 1.21 1.13 0.96  --  0.87  --  0.92  CALCIUM 8.3* 8.6* 8.2*  --  8.2*  --  8.4*  MG 2.5* 2.5* 2.3 1.9  --  1.9  --   PHOS 4.1 4.8* 4.9* 5.1*  --  3.8  --    Liver Function Tests:  Recent Labs Lab 12/13/16 0253  AST 37  ALT 17  ALKPHOS 124  BILITOT 0.6  PROT 7.4  ALBUMIN 2.9*   No results for input(s): LIPASE, AMYLASE in the last 168 hours. No results for input(s): AMMONIA in the last 168 hours. CBC:  Recent Labs Lab 12/13/16 0253  12/13/16 1353  12/14/16 0515 12/15/16 0157 12/16/16 0538 12/17/16 0218 12/19/16 0511  WBC 8.0  --  7.0  < > 8.8 9.9 9.2 11.1* 8.7   NEUTROABS 4.9  --  5.2  --   --   --   --   --   --   HGB 12.5*  < > 11.5*  < > 9.6* 10.0* 10.3* 11.4* 10.8*  HCT 42.9  < > 38.7*  < > 32.1* 32.4* 33.8* 37.9* 35.4*  MCV 89.4  --  86.8  < > 83.8 81.4 82.8 85.4 83.3  PLT 362  --  420*  < > 410* 408* 419* 407* 451*  < > = values in this interval not displayed. Cardiac Enzymes:  Recent Labs Lab 12/13/16 0253 12/13/16 1353  TROPONINI 0.03* 0.06*   BNP: BNP (last 3 results)  Recent Labs  10/30/16 0443 10/30/16 0850 12/13/16 0253  BNP 773.2* 594.4* 555.1*    ProBNP (last 3 results) No results for input(s): PROBNP in the last 8760 hours.  CBG:  Recent Labs Lab 12/18/16 1201 12/18/16 1634 12/18/16 2103 12/19/16 0803 12/19/16 1124  GLUCAP 223* 144* 191* 139* 205*     SIGNED: Time coordinating discharge: 30 minutes  MAGICK-Jye Fariss, MD  [facility] 12/19/2016, 11:54 AM Pager [phone]  If 7PM-7AM, please contact night-coverage [email or web address] Password [email or web address]

## 2016-12-21 DIAGNOSIS — I110 Hypertensive heart disease with heart failure: Secondary | ICD-10-CM | POA: Diagnosis not present

## 2016-12-21 DIAGNOSIS — J449 Chronic obstructive pulmonary disease, unspecified: Secondary | ICD-10-CM | POA: Diagnosis not present

## 2016-12-21 DIAGNOSIS — I5023 Acute on chronic systolic (congestive) heart failure: Secondary | ICD-10-CM | POA: Diagnosis not present

## 2016-12-21 DIAGNOSIS — E1165 Type 2 diabetes mellitus with hyperglycemia: Secondary | ICD-10-CM | POA: Diagnosis not present

## 2016-12-21 DIAGNOSIS — J9621 Acute and chronic respiratory failure with hypoxia: Secondary | ICD-10-CM | POA: Diagnosis not present

## 2016-12-21 DIAGNOSIS — I480 Paroxysmal atrial fibrillation: Secondary | ICD-10-CM | POA: Diagnosis not present

## 2016-12-22 ENCOUNTER — Other Ambulatory Visit: Payer: Self-pay

## 2016-12-22 ENCOUNTER — Other Ambulatory Visit (HOSPITAL_COMMUNITY): Payer: Self-pay | Admitting: Adult Health

## 2016-12-23 ENCOUNTER — Other Ambulatory Visit: Payer: Self-pay

## 2016-12-23 ENCOUNTER — Telehealth: Payer: Self-pay | Admitting: Interventional Cardiology

## 2016-12-23 ENCOUNTER — Other Ambulatory Visit (HOSPITAL_COMMUNITY): Payer: Self-pay | Admitting: *Deleted

## 2016-12-23 ENCOUNTER — Other Ambulatory Visit (HOSPITAL_COMMUNITY): Payer: Self-pay | Admitting: Cardiology

## 2016-12-23 MED ORDER — MAGNESIUM OXIDE 400 (241.3 MG) MG PO TABS: 400.0000 mg | ORAL_TABLET | Freq: Two times a day (BID) | ORAL | 3 refills | Status: DC

## 2016-12-23 NOTE — Patient Outreach (Signed)
    Unsuccessful attempt made to contact patient via telephone due to invalid number listed as contacts  Plan: Will send letter with this RNCM's contact information with  Request for return call.

## 2016-12-23 NOTE — Telephone Encounter (Signed)
Follow up    Patient is completely out of this medication, he was given it in the hospital.  And told per his discharge instruction to take it every evening, who does he get to fill this medication ?   magnesium oxide (MAG-OX) 400 (241.3 Mg) MG tablet   12/23/16 -- Jolaine Artist, MD    Take 1 tablet (400 mg total) by mouth 2 (two) times daily.

## 2016-12-23 NOTE — Telephone Encounter (Signed)
New Message      *STAT* If patient is at the pharmacy, call can be transferred to refill team.   1. Which medications need to be refilled? (please list name of each medication and dose if known) magnesium oxide 400 mg (1 tablet 2x daily)  2. Which pharmacy/location (including street and city if local pharmacy) is medication to be sent to? [facility] on [address]  3. Do they need a 30 day or 90 day supply? 30  Needs this called in before 5pm ([facility] will close at 5p)

## 2016-12-23 NOTE — Patient Outreach (Signed)
    Unsuccessful telephone contact made to contact patient from transition of care, assessment of community care coordination.  Plan: Make another attempt to contact patient in the next 21 days.

## 2016-12-25 DIAGNOSIS — G894 Chronic pain syndrome: Secondary | ICD-10-CM | POA: Diagnosis not present

## 2016-12-25 DIAGNOSIS — M792 Neuralgia and neuritis, unspecified: Secondary | ICD-10-CM | POA: Diagnosis not present

## 2016-12-25 DIAGNOSIS — Z79891 Long term (current) use of opiate analgesic: Secondary | ICD-10-CM | POA: Diagnosis not present

## 2016-12-25 DIAGNOSIS — Z79899 Other long term (current) drug therapy: Secondary | ICD-10-CM | POA: Diagnosis not present

## 2016-12-25 DIAGNOSIS — M25559 Pain in unspecified hip: Secondary | ICD-10-CM | POA: Diagnosis not present

## 2016-12-25 DIAGNOSIS — M545 Low back pain: Secondary | ICD-10-CM | POA: Diagnosis not present

## 2016-12-26 ENCOUNTER — Other Ambulatory Visit: Payer: Self-pay

## 2016-12-26 ENCOUNTER — Telehealth: Payer: Self-pay | Admitting: Interventional Cardiology

## 2016-12-26 MED ORDER — ROSUVASTATIN CALCIUM 40 MG PO TABS: 20.0000 mg | ORAL_TABLET | Freq: Every day | ORAL | 0 refills | Status: DC

## 2016-12-26 NOTE — Telephone Encounter (Addendum)
[facility] been changed to [facility], it would not let me escrib the RX for Crestor 40 mg 1/2 tab po qd. Called and gave a verbal of crestor 40 mg 1/2 tab po qd 15/2 to Coralyn Mark, confirmed the order back to me.

## 2016-12-26 NOTE — Telephone Encounter (Signed)
New Message      *STAT* If patient is at the pharmacy, call can be transferred to refill team.   1. Which medications need to be refilled? (please list name of each medication and dose if known) Crestor 78m   2. Which pharmacy/location (including street and city if local pharmacy) is medication to be sent to? [facility] [address]   3. Do they need a 30 day or 90 day supply?  [address]

## 2016-12-26 NOTE — Patient Outreach (Signed)
[facility]) Care Management  12/26/2016   Nicholas Caldwell 1946-08-13 [id number]  Subjective:  I got your letter, I am interested in [facility].  Objective:  Telephonic encounter  Current Medications:  Current Outpatient Prescriptions  Medication Sig Dispense Refill  . albuterol (PROVENTIL HFA;VENTOLIN HFA) 108 (90 BASE) MCG/ACT inhaler Inhale 2 puffs into the lungs every 6 (six) hours as needed for wheezing or shortness of breath. 1 Inhaler 2  . amoxicillin-clavulanate (AUGMENTIN) 875-125 MG tablet Take 1 tablet by mouth every 12 (twelve) hours. 10 tablet 0  . carvedilol (COREG) 12.5 MG tablet Take 1 tablet (12.5 mg total) by mouth 2 (two) times daily with a meal. 60 tablet 0  . dextromethorphan (DELSYM) 30 MG/5ML liquid Take 30 mg by mouth as needed for cough.    . diazepam (VALIUM) 10 MG tablet Take 0.5 tablets (5 mg total) by mouth daily as needed for anxiety or sleep. 30 tablet 0  . diclofenac sodium (VOLTAREN) 1 % GEL Apply 2 g topically 2 (two) times daily as needed (for pain).    . fluticasone (FLONASE) 50 MCG/ACT nasal spray Place 2 sprays into both nostrils daily as needed for allergies.     Marland Kitchen gabapentin (NEURONTIN) 600 MG tablet Take 1 tablet (600 mg total) by mouth 2 (two) times daily. 60 tablet 0  . glucose 4 GM chewable tablet Chew 4 tablets by mouth See admin instructions. Chew 4 tablets by mouth as needed for low blood sugar - repeat every 15 minutes if blood sugar less than 70    . hydrocortisone (ANUSOL-HC) 2.5 % rectal cream Place 1 application rectally 2 (two) times daily as needed for hemorrhoids or itching. Use up to 2 weeks at a time as needed    . hydrocortisone 1 % lotion Apply 1 application topically See admin instructions. Apply small amount to back twice daily for itchy rash    . insulin detemir (LEVEMIR) 100 UNIT/ML injection Inject 0.1 mLs (10 Units total) into the skin every evening. 10 mL 1  . ipratropium (ATROVENT) 0.02 % nebulizer solution Take 2.5  mLs (0.5 mg total) by nebulization every 6 (six) hours as needed for wheezing or shortness of breath. 75 mL 1  . magnesium oxide (MAG-OX) 400 (241.3 Mg) MG tablet Take 1 tablet (400 mg total) by mouth 2 (two) times daily. 30 tablet 3  . metFORMIN (GLUCOPHAGE-XR) 500 MG 24 hr tablet Take 1,000 mg by mouth 2 (two) times daily with a meal.    . mometasone-formoterol (DULERA) 200-5 MCG/ACT AERO Inhale 2 puffs into the lungs 2 (two) times daily.    . potassium chloride SA (K-DUR,KLOR-CON) 20 MEQ tablet Take 2 tablets (40 mEq total) by mouth daily. 30 tablet 0  . rivaroxaban (XARELTO) 20 MG TABS tablet Take 1 tablet (20 mg total) by mouth daily with supper. 30 tablet 0  . rosuvastatin (CRESTOR) 40 MG tablet Take 20 mg by mouth daily. For cholesterol    . torsemide (DEMADEX) 20 MG tablet Take 2 tablets (40 mg total) by mouth daily. 60 tablet 0   No current facility-administered medications for this visit.     Functional Status:  In your present state of health, do you have any difficulty performing the following activities: 12/26/2016 12/14/2016  Hearing? N N  Vision? Y N  Difficulty concentrating or making decisions? N N  Walking or climbing stairs? Y N  Dressing or bathing? N N  Doing errands, shopping? Y N  Preparing Food and eating ?  Y -  Using the Toilet? N -  In the past six months, have you accidently leaked urine? N -  Do you have problems with loss of bowel control? N -  Managing your Medications? N -  Managing your Finances? Y -  Housekeeping or managing your Housekeeping? Y -  Some recent data might be hidden    Fall/Depression Screening: PHQ 2/9 Scores 12/26/2016  PHQ - 2 Score 0   Fall Risk  12/26/2016  Falls in the past year? Yes  Number falls in past yr: 2 or more  Injury with Fall? Yes  Risk Factor Category  High Fall Risk  Risk for fall due to : History of fall(s);Impaired balance/gait;Impaired mobility;Impaired vision  Follow up Follow up appointment   [facility] CM Care  Plan Problem One   Flowsheet Row Most Recent Value  Care Plan Problem One  recent hospital admission  Role Documenting the Problem One  Care Management [place] for Problem One  Active  [facility] Long Term Goal (31-90 days)  Patient states he does not want to go back to the hospital for his breathing for the next month  [facility] Long Term Goal Start Date  12/26/16  Interventions for Problem One Long Term Goal  Initial assessment of case management goals  [facility] CM Short Term Goal #1 (0-30 days)  Patient will meet with [place] for COPD Education  [facility] CM Short Term Goal #1 Start Date  12/26/16    [facility] CM Care Plan Problem Two   Flowsheet Row Most Recent Value  Care Plan Problem Two  Patient has heart failure  Role Documenting the Problem Two  Care Management Coordinator  Care Plan for Problem Two  Active  Interventions for Problem Two Long Term Goal   Initial assessment indicates patient has lack of knowledge related to his heart failure management  [facility] Long Term Goal (31-90) days  In the next 45 days, patient will meet with [place] for heart failure education  [facility] Long Term Goal Start Date  12/26/16     Assessment:  This RNCM was ale to make contact with patient and his sister Nicholas Caldwell after several attempt. Home visit scheduled for COPD and Heart Failure Education.  Plan:  Home visit in the next 28 days for chronic disease education

## 2016-12-29 ENCOUNTER — Inpatient Hospital Stay (HOSPITAL_COMMUNITY): Admission: RE | Admit: 2016-12-29 | Payer: Medicare Other | Source: Ambulatory Visit

## 2016-12-31 ENCOUNTER — Ambulatory Visit: Payer: Medicare Other

## 2016-12-31 ENCOUNTER — Other Ambulatory Visit: Payer: Self-pay

## 2017-01-01 NOTE — Patient Outreach (Signed)
[facility]) Care Management  12/31/2016  Nicholas Caldwell July 09, 1946 [id number]   This RNCM arrived at patient's home for scheduled initial home visit. Patient wife allowed this RNCM's entrance into home. RNCM waiting in living room, could hear wife encouraging patient to get out of bed to meet with RNCM. Patient ask her to ask this RNCM to wait an hour (from 2 to 3 pm) then he would see her. This RNCM advised patient and his wife she would call within the next few days to reschedule appointment   Patient advises this RNCM he likes his appointments from 3 pm on. RNCM advises patient future appointments could occur at 3 or 4 but no later as our office closes at 5 pm.   Plan: Telephone contact in the next 21 days for community care coordination, establishment of case management goals

## 2017-01-06 ENCOUNTER — Other Ambulatory Visit (HOSPITAL_COMMUNITY): Payer: Self-pay

## 2017-01-07 ENCOUNTER — Encounter (HOSPITAL_COMMUNITY): Payer: Self-pay

## 2017-01-07 ENCOUNTER — Ambulatory Visit (HOSPITAL_COMMUNITY)
Admission: RE | Admit: 2017-01-07 | Discharge: 2017-01-07 | Disposition: A | Discharge status: Home / Self Care | Payer: Medicare Other | Source: Ambulatory Visit | Attending: Cardiology | Admitting: Cardiology

## 2017-01-07 ENCOUNTER — Other Ambulatory Visit: Payer: Self-pay

## 2017-01-07 VITALS — BP 126/72 | HR 60 | Wt 261.0 lb

## 2017-01-07 DIAGNOSIS — I110 Hypertensive heart disease with heart failure: Secondary | ICD-10-CM | POA: Insufficient documentation

## 2017-01-07 DIAGNOSIS — J449 Chronic obstructive pulmonary disease, unspecified: Secondary | ICD-10-CM | POA: Insufficient documentation

## 2017-01-07 DIAGNOSIS — E7800 Pure hypercholesterolemia, unspecified: Secondary | ICD-10-CM | POA: Diagnosis not present

## 2017-01-07 DIAGNOSIS — I2510 Atherosclerotic heart disease of native coronary artery without angina pectoris: Secondary | ICD-10-CM | POA: Insufficient documentation

## 2017-01-07 DIAGNOSIS — I471 Supraventricular tachycardia: Secondary | ICD-10-CM | POA: Diagnosis not present

## 2017-01-07 DIAGNOSIS — Z9981 Dependence on supplemental oxygen: Secondary | ICD-10-CM | POA: Insufficient documentation

## 2017-01-07 DIAGNOSIS — I429 Cardiomyopathy, unspecified: Secondary | ICD-10-CM | POA: Diagnosis not present

## 2017-01-07 DIAGNOSIS — I4581 Long QT syndrome: Secondary | ICD-10-CM | POA: Insufficient documentation

## 2017-01-07 DIAGNOSIS — I272 Pulmonary hypertension, unspecified: Secondary | ICD-10-CM | POA: Diagnosis not present

## 2017-01-07 DIAGNOSIS — I4892 Unspecified atrial flutter: Secondary | ICD-10-CM | POA: Diagnosis not present

## 2017-01-07 DIAGNOSIS — G4733 Obstructive sleep apnea (adult) (pediatric): Secondary | ICD-10-CM | POA: Insufficient documentation

## 2017-01-07 DIAGNOSIS — F17200 Nicotine dependence, unspecified, uncomplicated: Secondary | ICD-10-CM

## 2017-01-07 DIAGNOSIS — E119 Type 2 diabetes mellitus without complications: Secondary | ICD-10-CM | POA: Diagnosis not present

## 2017-01-07 DIAGNOSIS — Z8249 Family history of ischemic heart disease and other diseases of the circulatory system: Secondary | ICD-10-CM | POA: Diagnosis not present

## 2017-01-07 DIAGNOSIS — I480 Paroxysmal atrial fibrillation: Secondary | ICD-10-CM | POA: Diagnosis not present

## 2017-01-07 DIAGNOSIS — J9620 Acute and chronic respiratory failure, unspecified whether with hypoxia or hypercapnia: Secondary | ICD-10-CM | POA: Insufficient documentation

## 2017-01-07 DIAGNOSIS — F17210 Nicotine dependence, cigarettes, uncomplicated: Secondary | ICD-10-CM | POA: Diagnosis not present

## 2017-01-07 DIAGNOSIS — I5022 Chronic systolic (congestive) heart failure: Secondary | ICD-10-CM | POA: Insufficient documentation

## 2017-01-07 DIAGNOSIS — Z7901 Long term (current) use of anticoagulants: Secondary | ICD-10-CM | POA: Diagnosis not present

## 2017-01-07 DIAGNOSIS — Z794 Long term (current) use of insulin: Secondary | ICD-10-CM | POA: Insufficient documentation

## 2017-01-07 DIAGNOSIS — Z823 Family history of stroke: Secondary | ICD-10-CM | POA: Diagnosis not present

## 2017-01-07 LAB — BASIC METABOLIC PANEL
Anion gap: 8 (ref 5–15)
BUN: 9 mg/dL (ref 6–20)
CALCIUM: 8.8 mg/dL — AB (ref 8.9–10.3)
CO2: 32 mmol/L (ref 22–32)
CREATININE: 0.75 mg/dL (ref 0.61–1.24)
Chloride: 98 mmol/L — ABNORMAL LOW (ref 101–111)
Glucose, Bld: 154 mg/dL — ABNORMAL HIGH (ref 65–99)
Potassium: 4 mmol/L (ref 3.5–5.1)
Sodium: 138 mmol/L (ref 135–145)

## 2017-01-07 LAB — MAGNESIUM: MAGNESIUM: 2.1 mg/dL (ref 1.7–2.4)

## 2017-01-07 LAB — BRAIN NATRIURETIC PEPTIDE: B NATRIURETIC PEPTIDE 5: 538.9 pg/mL — AB (ref 0.0–100.0)

## 2017-01-07 MED ORDER — ROSUVASTATIN CALCIUM 40 MG PO TABS: 20.0000 mg | ORAL_TABLET | Freq: Every day | ORAL | 3 refills | Status: DC
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
## 2017-01-07 NOTE — Patient Instructions (Signed)
Routine lab work today. Will notify you of abnormal results, otherwise no news is good news!  Crestor refilled.  Follow up with Dr. Rayann Heman at [facility]. Address: [address] ([address]), [place], [place] [address]  Phone: [phone]  Follow up with Oda Kilts PA-C in 4 weeks.  Do the following things EVERYDAY: 1) Weigh yourself in the morning before breakfast. Write it down and keep it in a log. 2) Take your medicines as prescribed 3) Eat low salt foods-Limit salt (sodium) to 2000 mg per day.  4) Stay as active as you can everyday 5) Limit all fluids for the day to less than 2 liters

## 2017-01-08 NOTE — Patient Outreach (Signed)
   Unsuccessful attempt made to contact patient to reschedule home visit. HIPPA compliant message left with this RNCMs contact information.  Plan: Attempt contact in the next 21 days

## 2017-01-09 ENCOUNTER — Other Ambulatory Visit: Payer: Self-pay

## 2017-01-09 NOTE — Patient Outreach (Signed)
    Telephone call from patient to schedule a home visit. Plan: Home visit with patient in next 21 days to assess community care coordination needs

## 2017-01-15 ENCOUNTER — Other Ambulatory Visit: Payer: Self-pay

## 2017-01-15 NOTE — Patient Outreach (Signed)
[facility]) Care Management   01/15/2017  Nicholas Caldwell 01-Nov-1946 [id number]  Nicholas Caldwell is an 71 y.o. male  Subjective:  I was told I was going to get physical therapy but do not know why I am not getting it.  Objective:   ROS  Well nourished, elderly deconditioned gentleman.  Physical Exam ROS  Encounter Medications:   Outpatient Encounter Prescriptions as of 01/15/2017  Medication Sig  . albuterol (PROVENTIL HFA;VENTOLIN HFA) 108 (90 BASE) MCG/ACT inhaler Inhale 2 puffs into the lungs every 6 (six) hours as needed for wheezing or shortness of breath.  . carvedilol (COREG) 12.5 MG tablet Take 1 tablet (12.5 mg total) by mouth 2 (two) times daily with a meal.  . diazepam (VALIUM) 10 MG tablet Take 0.5 tablets (5 mg total) by mouth daily as needed for anxiety or sleep.  Marland Kitchen diclofenac sodium (VOLTAREN) 1 % GEL Apply 2 g topically 2 (two) times daily as needed (for pain).  . fluticasone (FLONASE) 50 MCG/ACT nasal spray Place 2 sprays into both nostrils daily as needed for allergies.   Marland Kitchen gabapentin (NEURONTIN) 600 MG tablet Take 1 tablet (600 mg total) by mouth 2 (two) times daily.  Marland Kitchen glucose 4 GM chewable tablet Chew 4 tablets by mouth See admin instructions. Chew 4 tablets by mouth as needed for low blood sugar - repeat every 15 minutes if blood sugar less than 70  . hydrocortisone (ANUSOL-HC) 2.5 % rectal cream Place 1 application rectally 2 (two) times daily as needed for hemorrhoids or itching. Use up to 2 weeks at a time as needed  . hydrocortisone 1 % lotion Apply 1 application topically See admin instructions. Apply small amount to back twice daily for itchy rash  . insulin detemir (LEVEMIR) 100 UNIT/ML injection Inject 0.1 mLs (10 Units total) into the skin every evening.  Marland Kitchen ipratropium (ATROVENT) 0.02 % nebulizer solution Take 2.5 mLs (0.5 mg total) by nebulization every 6 (six) hours as needed for wheezing or shortness of breath.  . magnesium oxide (MAG-OX)  400 (241.3 Mg) MG tablet Take 1 tablet (400 mg total) by mouth 2 (two) times daily.  . metFORMIN (GLUCOPHAGE-XR) 500 MG 24 hr tablet Take 1,000 mg by mouth 2 (two) times daily with a meal.  . mometasone-formoterol (DULERA) 200-5 MCG/ACT AERO Inhale 2 puffs into the lungs 2 (two) times daily.  . potassium chloride SA (K-DUR,KLOR-CON) 20 MEQ tablet Take 2 tablets (40 mEq total) by mouth daily.  . rivaroxaban (XARELTO) 20 MG TABS tablet Take 1 tablet (20 mg total) by mouth daily with supper.  . rosuvastatin (CRESTOR) 40 MG tablet Take 0.5 tablets (20 mg total) by mouth daily. For cholesterol  . torsemide (DEMADEX) 20 MG tablet Take 2 tablets (40 mg total) by mouth daily.   No facility-administered encounter medications on file as of 01/15/2017.     Functional Status:   In your present state of health, do you have any difficulty performing the following activities: 12/26/2016 12/14/2016  Hearing? N N  Vision? Y N  Difficulty concentrating or making decisions? N N  Walking or climbing stairs? Y N  Dressing or bathing? N N  Doing errands, shopping? Y N  Preparing Food and eating ? Y -  Using the Toilet? N -  In the past six months, have you accidently leaked urine? N -  Do you have problems with loss of bowel control? N -  Managing your Medications? N -  Managing your Finances? Y -  Housekeeping or managing your Housekeeping? Y -  Some recent data might be hidden    Fall/Depression Screening:    PHQ 2/9 Scores 12/26/2016  PHQ - 2 Score 0   Fall Risk  12/26/2016  Falls in the past year? Yes  Number falls in past yr: 2 or more  Injury with Fall? Yes  Risk Factor Category  High Fall Risk  Risk for fall due to : History of fall(s);Impaired balance/gait;Impaired mobility;Impaired vision  Follow up Follow up appointment   [facility] CM Care Plan Problem One     Most Recent Value  Care Plan Problem One  recent hospital admission  Role Documenting the Problem One  Care Management [place] for Problem One  Active  [facility] Long Term Goal (31-90 days)  Patient states he does not want to go back to the hospital for his breathing for the next month  [facility] Long Term Goal Start Date  12/26/16  Interventions for Problem One Long Term Goal  home visit to assess needs for community care assessment  [facility] CM Short Term Goal #1 (0-30 days)  Patient will meet with [facility] RNCM for COPD Education  [facility] CM Short Term Goal #1 Start Date  12/26/16  [facility] CM Short Term Goal #1 Met Date  01/15/17  Interventions for Short Term Goal #1  home visit assessmen, cOPD education completed     [facility] CM Care Plan Problem Two     Most Recent Value  Care Plan Problem Two  Patient has heart failure  Role Documenting the Problem Two  Care Management [place] for Problem Two  Active  Interventions for Problem Two Long Term Goal   home visit to coordinate community care coordination needs, assess for COPD management  [facility] Long Term Goal (31-90) days  In the next 45 days, patient will meet with [facility] RNCM for heart failure education  [facility] Long Term Goal Start Date  12/26/16     Assessment:   Patient's primary  Patient reports fasting blood glucose of mid 150s to 200s, after meals 200s and 300s Patient reports testing his glucose level 1-2 times per week. RNCM and patient viewed EMMI Video on COPD  Plan:  Home viist in the next 30 days for further COPD education.

## 2017-01-20 ENCOUNTER — Other Ambulatory Visit: Payer: Self-pay | Admitting: *Deleted

## 2017-01-20 MED ORDER — RIVAROXABAN 20 MG PO TABS: 20.0000 mg | ORAL_TABLET | Freq: Every day | ORAL | 4 refills | Status: DC

## 2017-01-20 NOTE — Telephone Encounter (Signed)
Pt saw Dr [name] on 11/24/16. Last SCr on 01/07/17 was 0.75. Creatine clearance 171.76 mL/min. Xarelto 57m QD is correct dosage.

## 2017-01-22 DIAGNOSIS — Z79891 Long term (current) use of opiate analgesic: Secondary | ICD-10-CM | POA: Diagnosis not present

## 2017-01-22 DIAGNOSIS — Z79899 Other long term (current) drug therapy: Secondary | ICD-10-CM | POA: Diagnosis not present

## 2017-01-22 DIAGNOSIS — M792 Neuralgia and neuritis, unspecified: Secondary | ICD-10-CM | POA: Diagnosis not present

## 2017-01-22 DIAGNOSIS — M545 Low back pain: Secondary | ICD-10-CM | POA: Diagnosis not present

## 2017-01-22 DIAGNOSIS — G894 Chronic pain syndrome: Secondary | ICD-10-CM | POA: Diagnosis not present

## 2017-01-22 DIAGNOSIS — M25559 Pain in unspecified hip: Secondary | ICD-10-CM | POA: Diagnosis not present

## 2017-01-29 ENCOUNTER — Ambulatory Visit: Payer: Self-pay

## 2017-02-04 ENCOUNTER — Ambulatory Visit (HOSPITAL_COMMUNITY)
Admission: RE | Admit: 2017-02-04 | Discharge: 2017-02-04 | Disposition: A | Discharge status: Home / Self Care | Payer: Medicare Other | Source: Ambulatory Visit | Attending: Internal Medicine | Admitting: Internal Medicine

## 2017-02-04 VITALS — BP 130/88 | Wt 258.6 lb

## 2017-02-04 DIAGNOSIS — Z794 Long term (current) use of insulin: Secondary | ICD-10-CM | POA: Diagnosis not present

## 2017-02-04 DIAGNOSIS — I5022 Chronic systolic (congestive) heart failure: Secondary | ICD-10-CM | POA: Insufficient documentation

## 2017-02-04 DIAGNOSIS — I429 Cardiomyopathy, unspecified: Secondary | ICD-10-CM | POA: Diagnosis not present

## 2017-02-04 DIAGNOSIS — I4892 Unspecified atrial flutter: Secondary | ICD-10-CM | POA: Diagnosis not present

## 2017-02-04 DIAGNOSIS — I480 Paroxysmal atrial fibrillation: Secondary | ICD-10-CM | POA: Diagnosis not present

## 2017-02-04 DIAGNOSIS — G4733 Obstructive sleep apnea (adult) (pediatric): Secondary | ICD-10-CM | POA: Diagnosis not present

## 2017-02-04 DIAGNOSIS — Z6835 Body mass index (BMI) 35.0-35.9, adult: Secondary | ICD-10-CM | POA: Diagnosis not present

## 2017-02-04 DIAGNOSIS — I272 Pulmonary hypertension, unspecified: Secondary | ICD-10-CM | POA: Insufficient documentation

## 2017-02-04 DIAGNOSIS — E119 Type 2 diabetes mellitus without complications: Secondary | ICD-10-CM | POA: Insufficient documentation

## 2017-02-04 DIAGNOSIS — J449 Chronic obstructive pulmonary disease, unspecified: Secondary | ICD-10-CM | POA: Insufficient documentation

## 2017-02-04 DIAGNOSIS — I2510 Atherosclerotic heart disease of native coronary artery without angina pectoris: Secondary | ICD-10-CM | POA: Diagnosis not present

## 2017-02-04 DIAGNOSIS — E669 Obesity, unspecified: Secondary | ICD-10-CM | POA: Insufficient documentation

## 2017-02-04 DIAGNOSIS — F17210 Nicotine dependence, cigarettes, uncomplicated: Secondary | ICD-10-CM | POA: Insufficient documentation

## 2017-02-04 DIAGNOSIS — I471 Supraventricular tachycardia: Secondary | ICD-10-CM | POA: Insufficient documentation

## 2017-02-04 DIAGNOSIS — Z8249 Family history of ischemic heart disease and other diseases of the circulatory system: Secondary | ICD-10-CM | POA: Diagnosis not present

## 2017-02-04 DIAGNOSIS — I110 Hypertensive heart disease with heart failure: Secondary | ICD-10-CM | POA: Diagnosis not present

## 2017-02-04 DIAGNOSIS — Z7901 Long term (current) use of anticoagulants: Secondary | ICD-10-CM | POA: Diagnosis not present

## 2017-02-04 DIAGNOSIS — E7800 Pure hypercholesterolemia, unspecified: Secondary | ICD-10-CM | POA: Diagnosis not present

## 2017-02-04 DIAGNOSIS — Z9981 Dependence on supplemental oxygen: Secondary | ICD-10-CM | POA: Diagnosis not present

## 2017-02-04 LAB — BASIC METABOLIC PANEL
Anion gap: 8 (ref 5–15)
BUN: 11 mg/dL (ref 6–20)
CHLORIDE: 100 mmol/L — AB (ref 101–111)
CO2: 30 mmol/L (ref 22–32)
CREATININE: 0.69 mg/dL (ref 0.61–1.24)
Calcium: 8.6 mg/dL — ABNORMAL LOW (ref 8.9–10.3)
GFR calc Af Amer: >60 mL/min (ref >60)
GFR calc non Af Amer: >60 mL/min (ref >60)
Glucose, Bld: 132 mg/dL — ABNORMAL HIGH (ref 65–99)
Potassium: 3.9 mmol/L (ref 3.5–5.1)
SODIUM: 138 mmol/L (ref 135–145)

## 2017-02-04 LAB — CBC
HEMATOCRIT: 34.5 % — AB (ref 39.0–52.0)
Hemoglobin: 10.6 g/dL — ABNORMAL LOW (ref 13.0–17.0)
MCH: 25.5 pg — ABNORMAL LOW (ref 26.0–34.0)
MCHC: 30.7 g/dL (ref 30.0–36.0)
MCV: 83.1 fL (ref 78.0–100.0)
PLATELETS: 388 10*3/uL (ref 150–400)
RBC: 4.15 MIL/uL — AB (ref 4.22–5.81)
RDW: 16.6 % — ABNORMAL HIGH (ref 11.5–15.5)
WBC: 7.8 10*3/uL (ref 4.0–10.5)

## 2017-02-04 LAB — MAGNESIUM: Magnesium: 1.8 mg/dL (ref 1.7–2.4)

## 2017-02-04 NOTE — Patient Instructions (Signed)
Labs today (will call for abnormal results, otherwise no news is good news)  You have been referred to see Dr. Rayann Heman, we will schedule your initial appointment.  Follow up with [name] in 2 Months
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
## 2017-02-05 ENCOUNTER — Encounter (HOSPITAL_COMMUNITY): Payer: Self-pay | Admitting: Cardiology

## 2017-02-05 NOTE — Progress Notes (Signed)
Marland Kitchen

## 2017-02-12 ENCOUNTER — Telehealth (HOSPITAL_COMMUNITY): Payer: Self-pay | Admitting: *Deleted

## 2017-02-12 NOTE — Telephone Encounter (Signed)
Magnesium (Order [id number])  Magnesium  Order: [id number]  Status:  Final result Visible to patient:  No (Not Released) Next appt:  02/19/2017 at 03:30 PM in Cardiology Virl Axe, MD) Dx:  Chronic systolic CHF (congestive hear...  Notes recorded by Kerry Dory, CMA on 02/05/2017 at 4:32 PM EDT Unable to reach patient. Number disconnected  [phone] Letter mailed ------  Notes recorded by Shirley Friar, PA-C on 02/04/2017 at 5:21 PM EDT Please start on Mag ox 200 mg daily. Thanks.    Legrand Como [address] [place], PA-C 02/04/2017 5:21 PM   Patient called back regarding lab results.  Patient is already taking Mag ox 400 mg BID.  Patient stated he has been on this dose since he was discharged from hospital almost 2 months ago.  Will send to [place], [place] for further evaluation.

## 2017-02-12 NOTE — Telephone Encounter (Signed)
No change then.  Thanks.    Legrand Como [address]" [place], PA-C 02/12/2017 9:54 AM

## 2017-02-17 ENCOUNTER — Ambulatory Visit (INDEPENDENT_AMBULATORY_CARE_PROVIDER_SITE_OTHER): Payer: Medicare Other | Admitting: Internal Medicine

## 2017-02-17 ENCOUNTER — Encounter: Payer: Self-pay | Admitting: Internal Medicine

## 2017-02-17 VITALS — BP 112/64 | HR 70 | Ht 72.0 in | Wt 268.0 lb

## 2017-02-17 DIAGNOSIS — R9431 Abnormal electrocardiogram [ECG] [EKG]: Secondary | ICD-10-CM

## 2017-02-17 DIAGNOSIS — I2510 Atherosclerotic heart disease of native coronary artery without angina pectoris: Secondary | ICD-10-CM

## 2017-02-17 NOTE — Patient Instructions (Signed)
Medication Instructions: - Your physician recommends that you continue on your current medications as directed. Please refer to the Current Medication list given to you today.  Labwork: - none ordered  Procedures/Testing: - none ordered  Follow-Up: - pending  Any Additional Special Instructions Will Be Listed Below (If Applicable). - call our office once you speak with your sister so Dr. Caryl Comes can be in touch with her- [phone] Nira Conn, RN)    If you need a refill on your cardiac medications before your next appointment, please call your pharmacy.

## 2017-02-17 NOTE — Progress Notes (Signed)
Patient Care Team: [facility] as PCP - [place], MD as Consulting Physician (Cardiology) Liliane Shi, PA-C as Physician Assistant (Cardiology) [name], MD as Attending Physician (Internal Medicine) Thompson Grayer, MD as Consulting Physician (Clinical Cardiac Electrophysiology) Tobi Bastos, RN as [facility]   HPI  Nicholas Caldwell is a 71 y.o. male Seen in follow-up from the hospital where he had had recurrent torsade de pointes  He was managed with magnesium lidocaine and isoproterenol with repletion of potassium that had occurred in the context of vigorous diuresis following admission for respiratory failure. He been on chronic amiodarone for atrial arrhythmias. Multiple ECGs were reviewed. 1/18 was without QT prolongation. Notably he was also on methadone at arrival.   Hospitalization was also notable for bradycardia prompting the discontinuation of Cardizem.  LVEF 2/18  40-45%  He had seen Dr. Rayann Heman on multiple occasions for his atrial arrhythmias. He was not felt to be a candidate for ablation because of severe atrial enlargement. He has declined an ICD.  He has a long history of modest QT prolongation associated with drugs in the past IV Levaquin.  He has a history of a seizure disorder as well as abrupt onset and offset presyncope. He dates his seizures to his time in [place]. His wife noted the presyncope; the patient denies it.  Family history is notable for the sudden death of a 26 year old nephew. He has 3 half siblings and one full sibling all of whom are related to his mother..    Records and Results Reviewed hospital records   Past Medical History:  Diagnosis Date  . Atrial flutter ([place])    a. recurrent AFlutter with RVR;  b. Amiodarone Rx started 4/16  . CAD (coronary artery disease)    a. LHC 1/16:  mLAD diffuse disease, pLCx mild disease, dLCx with disease but too small for PCI, RCA ok,  EF 25-30%  . Chronic pain   . Chronic systolic CHF (congestive heart failure) ([place])   . COPD (chronic obstructive pulmonary disease) ([place])   . Diabetes mellitus without complication ([place])   . Hypercholesteremia   . Hypertension   . NICM (nonischemic cardiomyopathy) ([place])    a.dx 2016. b. 2D echo 06/2016 - Last echo 07/01/16: mod dilated LV, mod LVH, EF 25-30%, mild-mod MR, sev LAE, mild-mod reduced RV systolic function, mild-mod TR, PASP 49mHG.  .Marland KitchenPAF (paroxysmal atrial fibrillation) (HCC)    On amio - ot a candidate for flecainide due to cardiomyopathy, not a candidate for Tikosyn due to prolonged QT, and felt to be a poor candidate for ablation given left atrial size.  . Prolonged Q-T interval on ECG   . [place]monary hypertension [place]   . Tobacco abuse     Past Surgical History:  Procedure Laterality Date  . LEFT AND RIGHT HEART CATHETERIZATION WITH CORONARY ANGIOGRAM N/A 11/06/2014   Procedure: LEFT AND RIGHT HEART CATHETERIZATION WITH CORONARY ANGIOGRAM;  Surgeon: JJettie Booze MD;  [facility];  Service: Cardiovascular;  Laterality: N/A;  . SPLENECTOMY      Current Outpatient Prescriptions  Medication Sig Dispense Refill  . albuterol (PROVENTIL HFA;VENTOLIN HFA) 108 (90 BASE) MCG/ACT inhaler Inhale 2 puffs into the lungs every 6 (six) hours as needed for wheezing or shortness of breath. 1 Inhaler 2  . carvedilol (COREG) 12.5 MG tablet Take 1 tablet (12.5 mg total) by mouth 2 (two) times daily with a meal. 60 tablet 0  .  diazepam (VALIUM) 10 MG tablet Take 0.5 tablets (5 mg total) by mouth daily as needed for anxiety or sleep. 30 tablet 0  . diclofenac sodium (VOLTAREN) 1 % GEL Apply 2 g topically 2 (two) times daily as needed (for pain).    . fluticasone (FLONASE) 50 MCG/ACT nasal spray Place 2 sprays into both nostrils daily as needed for allergies.     Marland Kitchen gabapentin (NEURONTIN) 600 MG tablet Take 1 tablet (600 mg total) by mouth 2 (two) times daily. 60 tablet 0  .  glucose 4 GM chewable tablet Chew 4 tablets by mouth See admin instructions. Chew 4 tablets by mouth as needed for low blood sugar - repeat every 15 minutes if blood sugar less than 70    . hydrocortisone (ANUSOL-HC) 2.5 % rectal cream Place 1 application rectally 2 (two) times daily as needed for hemorrhoids or itching. Use up to 2 weeks at a time as needed    . hydrocortisone 1 % lotion Apply 1 application topically See admin instructions. Apply small amount to back twice daily for itchy rash    . insulin detemir (LEVEMIR) 100 UNIT/ML injection Inject 0.1 mLs (10 Units total) into the skin every evening. 10 mL 1  . ipratropium (ATROVENT) 0.02 % nebulizer solution Take 2.5 mLs (0.5 mg total) by nebulization every 6 (six) hours as needed for wheezing or shortness of breath. 75 mL 1  . magnesium oxide (MAG-OX) 400 (241.3 Mg) MG tablet Take 1 tablet (400 mg total) by mouth 2 (two) times daily. 30 tablet 3  . metFORMIN (GLUCOPHAGE-XR) 500 MG 24 hr tablet Take 1,000 mg by mouth 2 (two) times daily with a meal.    . mometasone-formoterol (DULERA) 200-5 MCG/ACT AERO Inhale 2 puffs into the lungs 2 (two) times daily.    . potassium chloride SA (K-DUR,KLOR-CON) 20 MEQ tablet Take 2 tablets (40 mEq total) by mouth daily. 30 tablet 0  . rivaroxaban (XARELTO) 20 MG TABS tablet Take 1 tablet (20 mg total) by mouth daily with supper. 30 tablet 4  . rosuvastatin (CRESTOR) 40 MG tablet Take 0.5 tablets (20 mg total) by mouth daily. For cholesterol 45 tablet 3  . torsemide (DEMADEX) 20 MG tablet Take 2 tablets (40 mg total) by mouth daily. 60 tablet 0   No current facility-administered medications for this visit.     No Known Allergies    Review of Systems negative except from HPI and PMH  Physical Exam BP 112/64   Pulse 70   Ht 6' (1.829 m)   Wt 268 lb (121.6 kg)   SpO2 90%   BMI 36.35 kg/m  Well developed and well nourished in no acute distress HENT normal E scleral and icterus clear Neck  Supple JVP flat; carotids brisk and full Clear to ausculation  Regular rate and rhythm, no murmurs gallops or rub Soft with active bowel sounds No clubbing cyanosis  Edema Alert and oriented, sitting in wheel chair with L hemirparesis\ Skin Warm and Dry  ECG personally reviewed sinus at 71 18/11/44 with a QTC of 475  ECG 01/07/17 sinus at 58 QT interval 480/471   ECG 02/04/17 QT 500 ms   Assessment and  Plan  Probable long QT syndrome  Atrial fibrillation persistent with rapid ventricular rates   bradycardia-iatrogenic  Chronic systolic/diastolic heart failure  Oxygen-dependent COPD  Review of old electrocardiograms are quite concerning for long QT syndrome; his response to Levaquin 2016 highlights that concern (02/21/15-0722)  family history of sudden death 04-10-2023  also be related although at 71 years old coronary artery disease is a much more likely explanation. Work on getting more information regarding the death of his nephew.  I reviewed with the patient the issue of genetic transmission and the potential risks to the family. We'll start by trying to talk to his sister, mother of the deceased nephew.  The patient is unable to walk or stand so simple maneuvers to look for inadequate QT shortening are not possible. We will undertake epinephrine infusion on the Ackerman protocol   Current medicines are reviewed at length with the patient today .  The patient does not  have concerns regarding medicines.

## 2017-02-18 ENCOUNTER — Telehealth: Payer: Self-pay | Admitting: Internal Medicine

## 2017-02-18 ENCOUNTER — Institutional Professional Consult (permissible substitution): Payer: Medicare Other | Admitting: Internal Medicine

## 2017-02-18 NOTE — Telephone Encounter (Signed)
Nicholas Caldwell pt's daughter called to speak with Dr. Olin Pia nurse regarding a genetic trait her father has that could be hereditary. Nicholas Caldwell said that she has a cardiologist where she lives and will go to him to be tested.

## 2017-02-18 NOTE — Telephone Encounter (Signed)
New Message    Per pt daughter was told to call and speak with Dr. Caryl Comes nurse regarding a genetic trait her father has that could be hereditary. Requesting call back

## 2017-02-19 ENCOUNTER — Institutional Professional Consult (permissible substitution): Payer: Medicare Other | Admitting: Internal Medicine

## 2017-02-19 DIAGNOSIS — M545 Low back pain: Secondary | ICD-10-CM | POA: Diagnosis not present

## 2017-02-19 DIAGNOSIS — M25559 Pain in unspecified hip: Secondary | ICD-10-CM | POA: Diagnosis not present

## 2017-02-19 DIAGNOSIS — Z79899 Other long term (current) drug therapy: Secondary | ICD-10-CM | POA: Diagnosis not present

## 2017-02-19 DIAGNOSIS — Z79891 Long term (current) use of opiate analgesic: Secondary | ICD-10-CM | POA: Diagnosis not present

## 2017-02-19 DIAGNOSIS — G894 Chronic pain syndrome: Secondary | ICD-10-CM | POA: Diagnosis not present

## 2017-02-19 NOTE — Telephone Encounter (Signed)
Will forward to Dr. Caryl Comes to review/ for recommendations.

## 2017-02-23 ENCOUNTER — Other Ambulatory Visit: Payer: Self-pay

## 2017-02-23 NOTE — Telephone Encounter (Signed)
Follow Up   Pt daughter calling to follow up still hadn't heard anything requesting a call back

## 2017-02-23 NOTE — Telephone Encounter (Signed)
To Dr. Caryl Comes to review.

## 2017-02-24 NOTE — Telephone Encounter (Signed)
Spoke w daughter   Suggested evaluation at [facility] For possible Long QT

## 2017-02-24 NOTE — Patient Outreach (Signed)
   Unsuccessful attempt made to contact patient via telephone to schedule home visit. HIPPA compliant message left for patient with contact information and request for return call.  Plan: Make anotherattempt to contact in the next 21 days

## 2017-02-27 ENCOUNTER — Other Ambulatory Visit (HOSPITAL_COMMUNITY): Payer: Self-pay | Admitting: *Deleted

## 2017-02-27 MED ORDER — MAGNESIUM OXIDE 400 (241.3 MG) MG PO TABS: 400.0000 mg | ORAL_TABLET | Freq: Two times a day (BID) | ORAL | 3 refills | Status: DC

## 2017-03-03 ENCOUNTER — Encounter: Payer: Self-pay | Admitting: Internal Medicine

## 2017-03-03 ENCOUNTER — Telehealth: Payer: Self-pay | Admitting: Internal Medicine

## 2017-03-03 NOTE — Telephone Encounter (Signed)
New Message     Sister is returning your call about his condition

## 2017-03-03 NOTE — Progress Notes (Signed)
I spoke with the patient's sister. Nicholas Caldwell. Her son, Nicholas Caldwell, dye in a correctional facility in [place]. He was on the phone and died suddenly. An autopsy was performed. That record is not yet available. The patient was advised to have herself evaluated for long QT syndrome and she is very graciously agreed to fax [place] a copy of her son's autopsy report.

## 2017-03-03 NOTE — Telephone Encounter (Signed)
Will review with Dr. Caryl Comes what the plan is for this patient prior to calling back.

## 2017-03-04 NOTE — Telephone Encounter (Signed)
Dr. Caryl Comes called and spoke with the patient's sister last night.  She has agreed to fax the autopsy report on her son, Nicholas Caldwell, to [place]. Per Dr. Caryl Comes- will await a copy of the autopsy report prior to any further work up for Nicholas Caldwell.

## 2017-03-09 ENCOUNTER — Telehealth (HOSPITAL_COMMUNITY): Payer: Self-pay | Admitting: *Deleted

## 2017-03-09 NOTE — Telephone Encounter (Signed)
Patient called complaining of chest discomfort from coughing stated he is coughing up " green phlegm". Per Jonni Sanger patient needs to contact his PCP. Pt aware and agreeable

## 2017-03-11 ENCOUNTER — Telehealth: Payer: Self-pay | Admitting: Interventional Cardiology

## 2017-03-11 ENCOUNTER — Telehealth (HOSPITAL_COMMUNITY): Payer: Self-pay | Admitting: *Deleted

## 2017-03-11 NOTE — Telephone Encounter (Signed)
Pt is aware of medication changes

## 2017-03-11 NOTE — Telephone Encounter (Signed)
New Message    Pt c/o Shortness Of Breath: STAT if SOB developed within the last 24 hours or pt is noticeably SOB on the phone  1. Are you currently SOB (can you hear that pt is SOB on the phone)? A little   2. How long have you been experiencing SOB? 4 days  3. Are you SOB when sitting or when up moving around? Laying down and moving around   4. Are you currently experiencing any other symptoms? no

## 2017-03-11 NOTE — Telephone Encounter (Signed)
Patient called complaining of shortness of breath and swelling in feet. Pt denies that his weight is up but requests medication changes or to an office visit.  Message routed to Oda Kilts for advice

## 2017-03-11 NOTE — Telephone Encounter (Signed)
Addressed by CHF clinic in other phone note from today

## 2017-03-11 NOTE — Telephone Encounter (Signed)
He has orders for extra torsemide prn.   Please have him take 40 mg BID for up to 2 days to see if his swelling and symptoms are relieved. Should take extra 20 meq of potassium  Then should return to 40 mg daily.   Thanks!  Legrand Como [address] [place], PA-C 03/11/2017 12:50 PM

## 2017-03-16 ENCOUNTER — Other Ambulatory Visit: Payer: Self-pay

## 2017-03-16 NOTE — Patient Outreach (Signed)
   Unsuccessful attempt made to contact patient via telephone. HIPPA compliant message left for patient with this RNCM's contact information. Call was made to follow up with patient to determine if he has any additional case management needs as patient has met his case management goals  Plan: Discharge from caseload

## 2017-03-19 DIAGNOSIS — M87059 Idiopathic aseptic necrosis of unspecified femur: Secondary | ICD-10-CM | POA: Diagnosis not present

## 2017-03-19 DIAGNOSIS — M25559 Pain in unspecified hip: Secondary | ICD-10-CM | POA: Diagnosis not present

## 2017-03-19 DIAGNOSIS — G894 Chronic pain syndrome: Secondary | ICD-10-CM | POA: Diagnosis not present

## 2017-03-19 DIAGNOSIS — Z79899 Other long term (current) drug therapy: Secondary | ICD-10-CM | POA: Diagnosis not present

## 2017-03-19 DIAGNOSIS — M47816 Spondylosis without myelopathy or radiculopathy, lumbar region: Secondary | ICD-10-CM | POA: Diagnosis not present

## 2017-03-19 DIAGNOSIS — Z79891 Long term (current) use of opiate analgesic: Secondary | ICD-10-CM | POA: Diagnosis not present

## 2017-04-10 ENCOUNTER — Encounter (HOSPITAL_COMMUNITY): Payer: Self-pay

## 2017-04-10 ENCOUNTER — Ambulatory Visit (HOSPITAL_COMMUNITY)
Admission: RE | Admit: 2017-04-10 | Discharge: 2017-04-10 | Disposition: A | Discharge status: Home / Self Care | Payer: Medicare Other | Source: Ambulatory Visit | Attending: Cardiology | Admitting: Cardiology

## 2017-04-10 VITALS — BP 136/58 | HR 65 | Wt 276.0 lb

## 2017-04-10 DIAGNOSIS — Z9981 Dependence on supplemental oxygen: Secondary | ICD-10-CM | POA: Diagnosis not present

## 2017-04-10 DIAGNOSIS — I471 Supraventricular tachycardia: Secondary | ICD-10-CM | POA: Diagnosis not present

## 2017-04-10 DIAGNOSIS — F17200 Nicotine dependence, unspecified, uncomplicated: Secondary | ICD-10-CM | POA: Diagnosis not present

## 2017-04-10 DIAGNOSIS — I110 Hypertensive heart disease with heart failure: Secondary | ICD-10-CM | POA: Diagnosis not present

## 2017-04-10 DIAGNOSIS — I480 Paroxysmal atrial fibrillation: Secondary | ICD-10-CM | POA: Insufficient documentation

## 2017-04-10 DIAGNOSIS — Z8249 Family history of ischemic heart disease and other diseases of the circulatory system: Secondary | ICD-10-CM | POA: Diagnosis not present

## 2017-04-10 DIAGNOSIS — I429 Cardiomyopathy, unspecified: Secondary | ICD-10-CM | POA: Diagnosis not present

## 2017-04-10 DIAGNOSIS — Z794 Long term (current) use of insulin: Secondary | ICD-10-CM | POA: Insufficient documentation

## 2017-04-10 DIAGNOSIS — J449 Chronic obstructive pulmonary disease, unspecified: Secondary | ICD-10-CM | POA: Diagnosis not present

## 2017-04-10 DIAGNOSIS — F17210 Nicotine dependence, cigarettes, uncomplicated: Secondary | ICD-10-CM | POA: Insufficient documentation

## 2017-04-10 DIAGNOSIS — Z7901 Long term (current) use of anticoagulants: Secondary | ICD-10-CM | POA: Diagnosis not present

## 2017-04-10 DIAGNOSIS — E119 Type 2 diabetes mellitus without complications: Secondary | ICD-10-CM | POA: Diagnosis not present

## 2017-04-10 DIAGNOSIS — J9620 Acute and chronic respiratory failure, unspecified whether with hypoxia or hypercapnia: Secondary | ICD-10-CM | POA: Diagnosis not present

## 2017-04-10 DIAGNOSIS — I272 Pulmonary hypertension, unspecified: Secondary | ICD-10-CM | POA: Insufficient documentation

## 2017-04-10 DIAGNOSIS — I2510 Atherosclerotic heart disease of native coronary artery without angina pectoris: Secondary | ICD-10-CM | POA: Insufficient documentation

## 2017-04-10 DIAGNOSIS — G4733 Obstructive sleep apnea (adult) (pediatric): Secondary | ICD-10-CM | POA: Insufficient documentation

## 2017-04-10 DIAGNOSIS — E7800 Pure hypercholesterolemia, unspecified: Secondary | ICD-10-CM | POA: Diagnosis not present

## 2017-04-10 DIAGNOSIS — I5022 Chronic systolic (congestive) heart failure: Secondary | ICD-10-CM | POA: Diagnosis not present

## 2017-04-10 DIAGNOSIS — I4892 Unspecified atrial flutter: Secondary | ICD-10-CM | POA: Insufficient documentation

## 2017-04-10 LAB — BASIC METABOLIC PANEL
ANION GAP: 7 (ref 5–15)
BUN: 13 mg/dL (ref 6–20)
CALCIUM: 8.8 mg/dL — AB (ref 8.9–10.3)
CO2: 32 mmol/L (ref 22–32)
Chloride: 103 mmol/L (ref 101–111)
Creatinine, Ser: 0.76 mg/dL (ref 0.61–1.24)
GFR calc non Af Amer: >60 mL/min (ref >60)
Glucose, Bld: 157 mg/dL — ABNORMAL HIGH (ref 65–99)
POTASSIUM: 4.2 mmol/L (ref 3.5–5.1)
Sodium: 142 mmol/L (ref 135–145)

## 2017-04-10 MED ORDER — TORSEMIDE 20 MG PO TABS: 40.0000 mg | ORAL_TABLET | Freq: Two times a day (BID) | ORAL | 3 refills | Status: DC

## 2017-04-10 MED ORDER — METOLAZONE 2.5 MG PO TABS: 2.5000 mg | ORAL_TABLET | ORAL | 0 refills | Status: DC | PRN
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
## 2017-04-10 NOTE — Patient Instructions (Signed)
Labs today (will call for abnormal results, otherwise no news is good news)  INCREASE Torsemide to 40 mg (2 Tablets) Twice Daily.  Take Metolazone 2.5 mg (1 Tablet) ONCE Tomorrow. Only take this medication as directed. Also Take 1 EXTRA Potassium tomorrow with metolazone.  Follow up in 3 weeks.

## 2017-04-16 ENCOUNTER — Telehealth: Payer: Self-pay | Admitting: Internal Medicine

## 2017-04-16 DIAGNOSIS — Z79891 Long term (current) use of opiate analgesic: Secondary | ICD-10-CM | POA: Diagnosis not present

## 2017-04-16 DIAGNOSIS — M25559 Pain in unspecified hip: Secondary | ICD-10-CM | POA: Diagnosis not present

## 2017-04-16 DIAGNOSIS — M545 Low back pain: Secondary | ICD-10-CM | POA: Diagnosis not present

## 2017-04-16 DIAGNOSIS — Z79899 Other long term (current) drug therapy: Secondary | ICD-10-CM | POA: Diagnosis not present

## 2017-04-16 DIAGNOSIS — M792 Neuralgia and neuritis, unspecified: Secondary | ICD-10-CM | POA: Diagnosis not present

## 2017-04-16 DIAGNOSIS — G894 Chronic pain syndrome: Secondary | ICD-10-CM | POA: Diagnosis not present

## 2017-04-16 NOTE — Telephone Encounter (Signed)
Lm to call back ./cy 

## 2017-04-16 NOTE — Telephone Encounter (Signed)
Per daughter had testing done for long Qt per pt results suggest no reportable variances and will bring copy for Dr Caryl Comes records in  A week or 2 .

## 2017-04-16 NOTE — Telephone Encounter (Signed)
New Message    Daughter is following up with Dr Caryl Comes regarding the test that he told her to have done for Long QT.     See phone message 02/17/17

## 2017-04-30 ENCOUNTER — Ambulatory Visit (HOSPITAL_COMMUNITY)
Admission: RE | Admit: 2017-04-30 | Discharge: 2017-04-30 | Disposition: A | Discharge status: Home / Self Care | Payer: Medicare Other | Source: Ambulatory Visit | Attending: Cardiology | Admitting: Cardiology

## 2017-04-30 ENCOUNTER — Encounter (HOSPITAL_COMMUNITY): Payer: Self-pay

## 2017-04-30 ENCOUNTER — Encounter (HOSPITAL_COMMUNITY): Payer: Medicare Other

## 2017-04-30 VITALS — BP 128/88 | HR 80 | Wt 270.0 lb

## 2017-04-30 DIAGNOSIS — F17210 Nicotine dependence, cigarettes, uncomplicated: Secondary | ICD-10-CM | POA: Diagnosis not present

## 2017-04-30 DIAGNOSIS — I272 Pulmonary hypertension, unspecified: Secondary | ICD-10-CM | POA: Insufficient documentation

## 2017-04-30 DIAGNOSIS — J449 Chronic obstructive pulmonary disease, unspecified: Secondary | ICD-10-CM | POA: Diagnosis not present

## 2017-04-30 DIAGNOSIS — Z8249 Family history of ischemic heart disease and other diseases of the circulatory system: Secondary | ICD-10-CM | POA: Diagnosis not present

## 2017-04-30 DIAGNOSIS — Z79899 Other long term (current) drug therapy: Secondary | ICD-10-CM | POA: Insufficient documentation

## 2017-04-30 DIAGNOSIS — Z823 Family history of stroke: Secondary | ICD-10-CM | POA: Insufficient documentation

## 2017-04-30 DIAGNOSIS — E785 Hyperlipidemia, unspecified: Secondary | ICD-10-CM | POA: Diagnosis not present

## 2017-04-30 DIAGNOSIS — I2510 Atherosclerotic heart disease of native coronary artery without angina pectoris: Secondary | ICD-10-CM | POA: Diagnosis not present

## 2017-04-30 DIAGNOSIS — F17200 Nicotine dependence, unspecified, uncomplicated: Secondary | ICD-10-CM

## 2017-04-30 DIAGNOSIS — Z794 Long term (current) use of insulin: Secondary | ICD-10-CM | POA: Diagnosis not present

## 2017-04-30 DIAGNOSIS — I110 Hypertensive heart disease with heart failure: Secondary | ICD-10-CM | POA: Diagnosis not present

## 2017-04-30 DIAGNOSIS — G8929 Other chronic pain: Secondary | ICD-10-CM | POA: Insufficient documentation

## 2017-04-30 DIAGNOSIS — I5022 Chronic systolic (congestive) heart failure: Secondary | ICD-10-CM

## 2017-04-30 DIAGNOSIS — G4733 Obstructive sleep apnea (adult) (pediatric): Secondary | ICD-10-CM | POA: Insufficient documentation

## 2017-04-30 DIAGNOSIS — E119 Type 2 diabetes mellitus without complications: Secondary | ICD-10-CM | POA: Diagnosis not present

## 2017-04-30 DIAGNOSIS — I480 Paroxysmal atrial fibrillation: Secondary | ICD-10-CM

## 2017-04-30 DIAGNOSIS — Z7901 Long term (current) use of anticoagulants: Secondary | ICD-10-CM | POA: Insufficient documentation

## 2017-04-30 DIAGNOSIS — Z9981 Dependence on supplemental oxygen: Secondary | ICD-10-CM | POA: Insufficient documentation

## 2017-04-30 DIAGNOSIS — I429 Cardiomyopathy, unspecified: Secondary | ICD-10-CM | POA: Insufficient documentation

## 2017-04-30 LAB — BASIC METABOLIC PANEL
Anion gap: 5 (ref 5–15)
BUN: 11 mg/dL (ref 6–20)
CALCIUM: 8.6 mg/dL — AB (ref 8.9–10.3)
CO2: 31 mmol/L (ref 22–32)
CREATININE: 0.77 mg/dL (ref 0.61–1.24)
Chloride: 103 mmol/L (ref 101–111)
GFR calc non Af Amer: >60 mL/min (ref >60)
Glucose, Bld: 200 mg/dL — ABNORMAL HIGH (ref 65–99)
Potassium: 4.2 mmol/L (ref 3.5–5.1)
SODIUM: 139 mmol/L (ref 135–145)

## 2017-04-30 NOTE — Patient Instructions (Signed)
Labs today We will only contact you if something comes back abnormal or we need to make some changes. Otherwise no news is good news!   Your physician recommends that you schedule a follow-up appointment in: 2 months with Oda Kilts, PA   Do the following things EVERYDAY: 1) Weigh yourself in the morning before breakfast. Write it down and keep it in a log. 2) Take your medicines as prescribed 3) Eat low salt foods-Limit salt (sodium) to 2000 mg per day.  4) Stay as active as you can everyday 5) Limit all fluids for the day to less than 2 liters
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
## 2017-05-04 ENCOUNTER — Inpatient Hospital Stay (HOSPITAL_COMMUNITY): Admission: RE | Admit: 2017-05-04 | Payer: Medicare Other | Source: Ambulatory Visit

## 2017-05-14 DIAGNOSIS — Z79891 Long term (current) use of opiate analgesic: Secondary | ICD-10-CM | POA: Diagnosis not present

## 2017-05-14 DIAGNOSIS — M792 Neuralgia and neuritis, unspecified: Secondary | ICD-10-CM | POA: Diagnosis not present

## 2017-05-14 DIAGNOSIS — M25559 Pain in unspecified hip: Secondary | ICD-10-CM | POA: Diagnosis not present

## 2017-05-14 DIAGNOSIS — Z79899 Other long term (current) drug therapy: Secondary | ICD-10-CM | POA: Diagnosis not present

## 2017-05-14 DIAGNOSIS — M545 Low back pain: Secondary | ICD-10-CM | POA: Diagnosis not present

## 2017-05-14 DIAGNOSIS — G894 Chronic pain syndrome: Secondary | ICD-10-CM | POA: Diagnosis not present

## 2017-06-08 DIAGNOSIS — M545 Low back pain: Secondary | ICD-10-CM | POA: Diagnosis not present

## 2017-06-08 DIAGNOSIS — G894 Chronic pain syndrome: Secondary | ICD-10-CM | POA: Diagnosis not present

## 2017-06-08 DIAGNOSIS — Z79899 Other long term (current) drug therapy: Secondary | ICD-10-CM | POA: Diagnosis not present

## 2017-06-08 DIAGNOSIS — M792 Neuralgia and neuritis, unspecified: Secondary | ICD-10-CM | POA: Diagnosis not present

## 2017-06-08 DIAGNOSIS — M25559 Pain in unspecified hip: Secondary | ICD-10-CM | POA: Diagnosis not present

## 2017-06-08 DIAGNOSIS — Z79891 Long term (current) use of opiate analgesic: Secondary | ICD-10-CM | POA: Diagnosis not present

## 2017-06-25 ENCOUNTER — Ambulatory Visit (HOSPITAL_COMMUNITY)
Admission: RE | Admit: 2017-06-25 | Discharge: 2017-06-25 | Disposition: A | Discharge status: Home / Self Care | Payer: Medicare Other | Source: Ambulatory Visit | Attending: Internal Medicine | Admitting: Internal Medicine

## 2017-06-25 ENCOUNTER — Encounter (HOSPITAL_COMMUNITY): Payer: Self-pay

## 2017-06-25 VITALS — BP 108/58 | HR 72 | Wt 268.0 lb

## 2017-06-25 DIAGNOSIS — E7800 Pure hypercholesterolemia, unspecified: Secondary | ICD-10-CM | POA: Diagnosis not present

## 2017-06-25 DIAGNOSIS — I480 Paroxysmal atrial fibrillation: Secondary | ICD-10-CM | POA: Diagnosis not present

## 2017-06-25 DIAGNOSIS — G4733 Obstructive sleep apnea (adult) (pediatric): Secondary | ICD-10-CM | POA: Insufficient documentation

## 2017-06-25 DIAGNOSIS — I5022 Chronic systolic (congestive) heart failure: Secondary | ICD-10-CM | POA: Diagnosis not present

## 2017-06-25 DIAGNOSIS — I429 Cardiomyopathy, unspecified: Secondary | ICD-10-CM | POA: Diagnosis not present

## 2017-06-25 DIAGNOSIS — Z7901 Long term (current) use of anticoagulants: Secondary | ICD-10-CM | POA: Diagnosis not present

## 2017-06-25 DIAGNOSIS — I2510 Atherosclerotic heart disease of native coronary artery without angina pectoris: Secondary | ICD-10-CM | POA: Insufficient documentation

## 2017-06-25 DIAGNOSIS — J449 Chronic obstructive pulmonary disease, unspecified: Secondary | ICD-10-CM

## 2017-06-25 DIAGNOSIS — I110 Hypertensive heart disease with heart failure: Secondary | ICD-10-CM | POA: Insufficient documentation

## 2017-06-25 DIAGNOSIS — F17200 Nicotine dependence, unspecified, uncomplicated: Secondary | ICD-10-CM

## 2017-06-25 DIAGNOSIS — I272 Pulmonary hypertension, unspecified: Secondary | ICD-10-CM | POA: Diagnosis not present

## 2017-06-25 DIAGNOSIS — G8929 Other chronic pain: Secondary | ICD-10-CM | POA: Insufficient documentation

## 2017-06-25 DIAGNOSIS — I4892 Unspecified atrial flutter: Secondary | ICD-10-CM | POA: Diagnosis not present

## 2017-06-25 DIAGNOSIS — E119 Type 2 diabetes mellitus without complications: Secondary | ICD-10-CM | POA: Diagnosis not present

## 2017-06-25 DIAGNOSIS — Z9981 Dependence on supplemental oxygen: Secondary | ICD-10-CM | POA: Diagnosis not present

## 2017-06-25 DIAGNOSIS — J9620 Acute and chronic respiratory failure, unspecified whether with hypoxia or hypercapnia: Secondary | ICD-10-CM | POA: Insufficient documentation

## 2017-06-25 DIAGNOSIS — I471 Supraventricular tachycardia: Secondary | ICD-10-CM | POA: Diagnosis not present

## 2017-06-25 DIAGNOSIS — E785 Hyperlipidemia, unspecified: Secondary | ICD-10-CM | POA: Diagnosis not present

## 2017-06-25 DIAGNOSIS — F17210 Nicotine dependence, cigarettes, uncomplicated: Secondary | ICD-10-CM | POA: Insufficient documentation

## 2017-06-25 DIAGNOSIS — Z9114 Patient's other noncompliance with medication regimen: Secondary | ICD-10-CM | POA: Diagnosis not present

## 2017-06-25 DIAGNOSIS — Z794 Long term (current) use of insulin: Secondary | ICD-10-CM | POA: Insufficient documentation

## 2017-06-25 LAB — BASIC METABOLIC PANEL
Anion gap: 6 (ref 5–15)
BUN: 11 mg/dL (ref 6–20)
CO2: 34 mmol/L — AB (ref 22–32)
Calcium: 8.5 mg/dL — ABNORMAL LOW (ref 8.9–10.3)
Chloride: 100 mmol/L — ABNORMAL LOW (ref 101–111)
Creatinine, Ser: 0.83 mg/dL (ref 0.61–1.24)
GFR calc non Af Amer: >60 mL/min (ref >60)
Glucose, Bld: 141 mg/dL — ABNORMAL HIGH (ref 65–99)
POTASSIUM: 4.2 mmol/L (ref 3.5–5.1)
SODIUM: 140 mmol/L (ref 135–145)

## 2017-06-25 MED ORDER — TORSEMIDE 20 MG PO TABS: ORAL_TABLET | ORAL | 6 refills | Status: DC

## 2017-06-25 MED ORDER — SPIRONOLACTONE 25 MG PO TABS: 12.5000 mg | ORAL_TABLET | Freq: Every day | ORAL | 3 refills | Status: DC

## 2017-06-25 NOTE — Patient Instructions (Signed)
Routine lab work today. Will notify you of abnormal results, otherwise no news is good news!  CHANGE torsemide: Take 40 mg (2 tabs) in am and 20 mg (1 tab) in pm.  START Spironolactone 12.5 mg (1/2 tablet) once daily.  Return in 2 weeks to repeat labs.  ________________________________________________________________  ________________________________________________________________   Follow up 6-8 weeks with Oda Kilts PA-C.  ________________________________________________________________  ________________________________________________________________  Take all medication as prescribed the day of your appointment. Bring all medications with you to your appointment.  Do the following things EVERYDAY: 1) Weigh yourself in the morning before breakfast. Write it down and keep it in a log. 2) Take your medicines as prescribed 3) Eat low salt foods-Limit salt (sodium) to 2000 mg per day.  4) Stay as active as you can everyday 5) Limit all fluids for the day to less than 2 liters
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
## 2017-07-07 DIAGNOSIS — Z79891 Long term (current) use of opiate analgesic: Secondary | ICD-10-CM | POA: Diagnosis not present

## 2017-07-07 DIAGNOSIS — G894 Chronic pain syndrome: Secondary | ICD-10-CM | POA: Diagnosis not present

## 2017-07-07 DIAGNOSIS — M25559 Pain in unspecified hip: Secondary | ICD-10-CM | POA: Diagnosis not present

## 2017-07-07 DIAGNOSIS — M545 Low back pain: Secondary | ICD-10-CM | POA: Diagnosis not present

## 2017-07-07 DIAGNOSIS — M792 Neuralgia and neuritis, unspecified: Secondary | ICD-10-CM | POA: Diagnosis not present

## 2017-07-07 DIAGNOSIS — Z79899 Other long term (current) drug therapy: Secondary | ICD-10-CM | POA: Diagnosis not present

## 2017-07-09 ENCOUNTER — Inpatient Hospital Stay (HOSPITAL_COMMUNITY): Admission: RE | Admit: 2017-07-09 | Payer: Medicare Other | Source: Ambulatory Visit

## 2017-07-19 NOTE — Progress Notes (Deleted)
Cardiology Office Note Date:  07/19/2017  Patient ID:  Nicholas Caldwell, [name] 1946/03/27, MRN [id number] PCP:  [facility]  CHF:  [name] Cardiologist: Dr. [name] Electrophysiologist: Dr. Caryl Comes, [place]  ***refresh   Chief Complaint: ***  History of Present Illness: Nicholas Caldwell is a 72 y.o. male with history of COPD on home O2, chronic CHF, NICM, HTN, DM, OSA noncompliant with CPAP.  He has been seen by Dr. Caryl Comes for possible long QT syndrome, Dr. Rayann Heman for AF/atrial arrhythmias.  He has hx of recurrent torsade de pointes managed with magnesium lidocaine and isoproterenol with repletion of potassium that had occurred in the context of vigorous diuresis following admission for respiratory failure. He been on chronic amiodarone for atrial arrhythmias. Multiple ECGs were reviewed. 1/18 was without QT prolongation. Notably he was also on methadone at arrival.   Hospitalization was also notable for bradycardia prompting the discontinuation of Cardizem.  Last seen by Dr. Caryl Comes in April at that time planned for epinephrine infusion, I don't see that this was completed  *** Symptoms *** Dr.Klein genetics? *** syncope *** meds *** fluid status  AFib Hx Not felt an ablation candidate AAD, not tikosyn candidate with prolonged QT, amiodarone stopped 2/2 QT, not flecainide 2/2 CM  Past Medical History:  Diagnosis Date  . Atrial flutter ([place])    a. recurrent AFlutter with RVR;  b. Amiodarone Rx started 4/16  . CAD (coronary artery disease)    a. LHC 1/16:  mLAD diffuse disease, pLCx mild disease, dLCx with disease but too small for PCI, RCA ok, EF 25-30%  . Chronic pain   . Chronic systolic CHF (congestive heart failure) ([place])   . COPD (chronic obstructive pulmonary disease) ([place])   . Diabetes mellitus without complication ([place])   . Hypercholesteremia   . Hypertension   . NICM (nonischemic cardiomyopathy) ([place])    a.dx 2016. b. 2D echo 06/2016 - Last echo 07/01/16: mod  dilated LV, mod LVH, EF 25-30%, mild-mod MR, sev LAE, mild-mod reduced RV systolic function, mild-mod TR, PASP 49mHG.  .Marland KitchenPAF (paroxysmal atrial fibrillation) (HCC)    On amio - ot a candidate for flecainide due to cardiomyopathy, not a candidate for Tikosyn due to prolonged QT, and felt to be a poor candidate for ablation given left atrial size.  . Prolonged Q-T interval on ECG   . Pulmonary hypertension ([place]   . Tobacco abuse     Past Surgical History:  Procedure Laterality Date  . LEFT AND RIGHT HEART CATHETERIZATION WITH CORONARY ANGIOGRAM N/A 11/06/2014   Procedure: LEFT AND RIGHT HEART CATHETERIZATION WITH CORONARY ANGIOGRAM;  Surgeon: JJettie Booze MD;  [facility];  Service: Cardiovascular;  Laterality: N/A;  . SPLENECTOMY      Current Outpatient Prescriptions  Medication Sig Dispense Refill  . albuterol (PROVENTIL HFA;VENTOLIN HFA) 108 (90 BASE) MCG/ACT inhaler Inhale 2 puffs into the lungs every 6 (six) hours as needed for wheezing or shortness of breath. 1 Inhaler 2  . carvedilol (COREG) 12.5 MG tablet Take 1 tablet (12.5 mg total) by mouth 2 (two) times daily with a meal. 60 tablet 0  . diazepam (VALIUM) 10 MG tablet Take 0.5 tablets (5 mg total) by mouth daily as needed for anxiety or sleep. 30 tablet 0  . diclofenac sodium (VOLTAREN) 1 % GEL Apply 2 g topically 2 (two) times daily as needed (for pain).    . fluticasone (FLONASE) 50 MCG/ACT nasal spray Place 2 sprays into both nostrils daily  as needed for allergies.     Marland Kitchen gabapentin (NEURONTIN) 600 MG tablet Take 1 tablet (600 mg total) by mouth 2 (two) times daily. 60 tablet 0  . glucose 4 GM chewable tablet Chew 4 tablets by mouth See admin instructions. Chew 4 tablets by mouth as needed for low blood sugar - repeat every 15 minutes if blood sugar less than 70    . hydrocortisone (ANUSOL-HC) 2.5 % rectal cream Place 1 application rectally 2 (two) times daily as needed for hemorrhoids or itching. Use up to 2  weeks at a time as needed    . hydrocortisone 1 % lotion Apply 1 application topically See admin instructions. Apply small amount to back twice daily for itchy rash    . insulin detemir (LEVEMIR) 100 UNIT/ML injection Inject 0.1 mLs (10 Units total) into the skin every evening. 10 mL 1  . ipratropium (ATROVENT) 0.02 % nebulizer solution Take 2.5 mLs (0.5 mg total) by nebulization every 6 (six) hours as needed for wheezing or shortness of breath. 75 mL 1  . magnesium oxide (MAG-OX) 400 (241.3 Mg) MG tablet Take 1 tablet (400 mg total) by mouth 2 (two) times daily. 30 tablet 3  . metFORMIN (GLUCOPHAGE-XR) 500 MG 24 hr tablet Take 1,000 mg by mouth 2 (two) times daily with a meal.    . metolazone (ZAROXOLYN) 2.5 MG tablet Take 1 tablet (2.5 mg total) by mouth as needed. Take only as directed. 5 tablet 0  . mometasone-formoterol (DULERA) 200-5 MCG/ACT AERO Inhale 2 puffs into the lungs 2 (two) times daily.    . potassium chloride SA (K-DUR,KLOR-CON) 20 MEQ tablet Take 2 tablets (40 mEq total) by mouth daily. 30 tablet 0  . rivaroxaban (XARELTO) 20 MG TABS tablet Take 1 tablet (20 mg total) by mouth daily with supper. 30 tablet 4  . rosuvastatin (CRESTOR) 40 MG tablet Take 0.5 tablets (20 mg total) by mouth daily. For cholesterol 45 tablet 3  . spironolactone (ALDACTONE) 25 MG tablet Take 0.5 tablets (12.5 mg total) by mouth daily. 45 tablet 3  . torsemide (DEMADEX) 20 MG tablet Take 40 mg (2 tabs) in am and 20 mg (1 tab) in pm 90 tablet 6   No current facility-administered medications for this visit.     Allergies:   Patient has no known allergies.   Social History:  The patient  reports that he has been smoking Cigarettes.  He has been smoking about 0.05 packs per day. He has never used smokeless tobacco. He reports that he does not drink alcohol or use drugs.   Family History:  The patient's family history includes Heart disease in his father and mother; Heart failure in his mother; Hypertension  in his mother and sister.  ROS:  Please see the history of present illness.  All other systems are reviewed and otherwise negative.   PHYSICAL EXAM: *** VS:  There were no vitals taken for this visit. BMI: There is no height or weight on file to calculate BMI. Well nourished, well developed, in no acute distress  HEENT: normocephalic, atraumatic  Neck: no JVD, carotid bruits or masses Cardiac:  *** RRR; no significant murmurs, no rubs, or gallops Lungs:  *** CTA b/l, no wheezing, rhonchi or rales  Abd: soft, nontender MS: no deformity or *** atrophy Ext: *** no edema  Skin: warm and dry, no rash Neuro:  No gross deficits appreciated Psych: euthymic mood, full affect  *** ICD site is stable, no tethering or discomfort  EKG:  Done today shows ***  12/13/16: TTE Study Conclusions - Left ventricle: The cavity size was normal. Wall thickness was   increased in a pattern of mild LVH. Systolic function was mildly   to moderately reduced. The estimated ejection fraction was in the   range of 40% to 45%. Diffuse hypokinesis. There is hypokinesis of   the basal-midinferior and inferoseptal myocardium. Features are   consistent with a pseudonormal left ventricular filling pattern,   with concomitant abnormal relaxation and increased filling   pressure (grade 2 diastolic dysfunction). - Left atrium: The atrium was moderately dilated. - Right ventricle: The cavity size was mildly dilated. Wall   thickness was normal. - Right atrium: The atrium was moderately dilated. - Pulmonary arteries: Systolic pressure was moderately increased.   PA peak pressure: 56 mm Hg (S). Impressions: - EF is mildly improved when compared to prior study (35%)  Recent Labs: 07/28/2016: TSH 1.745 12/13/2016: ALT 17 01/07/2017: B Natriuretic Peptide 538.9 02/04/2017: Hemoglobin 10.6; Magnesium 1.8; Platelets 388 06/25/2017: BUN 11; Creatinine, Ser 0.83; Potassium 4.2; Sodium 140  11/01/2016: Cholesterol 124; HDL  38; LDL Cholesterol 67; Total CHOL/HDL Ratio 3.3; Triglycerides 95; VLDL 19   CrCl cannot be calculated (Patient's most recent lab result is older than the maximum 21 days allowed.).   Wt Readings from Last 3 Encounters:  06/25/17 268 lb (121.6 kg)  04/30/17 270 lb (122.5 kg)  04/10/17 276 lb (125.2 kg)     Other studies reviewed: Additional studies/records reviewed today include: summarized above  ASSESSMENT AND PLAN:  1. Chronic CHF, systolic     ***  2. Paroxysmal AFib     CHA2DS2Vasc is at least 5, on Xarelto  3. HTN     ***  4. ? Long QT     ***  Disposition: F/u with ***  Current medicines are reviewed at length with the patient today.  The patient did not have any concerns regarding medicines.***  Signed, Tommye Standard, PA-C 07/19/2017 1:25 PM     [place] [phone] (office)  [phone] (fax)

## 2017-07-22 ENCOUNTER — Ambulatory Visit: Payer: Medicare Other | Admitting: Physician Assistant

## 2017-08-04 DIAGNOSIS — Z79899 Other long term (current) drug therapy: Secondary | ICD-10-CM | POA: Diagnosis not present

## 2017-08-04 DIAGNOSIS — M792 Neuralgia and neuritis, unspecified: Secondary | ICD-10-CM | POA: Diagnosis not present

## 2017-08-04 DIAGNOSIS — Z79891 Long term (current) use of opiate analgesic: Secondary | ICD-10-CM | POA: Diagnosis not present

## 2017-08-04 DIAGNOSIS — M25559 Pain in unspecified hip: Secondary | ICD-10-CM | POA: Diagnosis not present

## 2017-08-04 DIAGNOSIS — M545 Low back pain: Secondary | ICD-10-CM | POA: Diagnosis not present

## 2017-08-04 DIAGNOSIS — G894 Chronic pain syndrome: Secondary | ICD-10-CM | POA: Diagnosis not present

## 2017-08-06 ENCOUNTER — Ambulatory Visit (HOSPITAL_COMMUNITY)
Admission: RE | Admit: 2017-08-06 | Discharge: 2017-08-06 | Disposition: A | Discharge status: Home / Self Care | Payer: Medicare Other | Source: Ambulatory Visit | Attending: Cardiology | Admitting: Cardiology

## 2017-08-06 ENCOUNTER — Encounter (HOSPITAL_COMMUNITY): Payer: Self-pay

## 2017-08-06 ENCOUNTER — Ambulatory Visit: Payer: Medicare Other | Admitting: Physician Assistant

## 2017-08-06 VITALS — BP 166/70 | HR 75 | Wt 277.2 lb

## 2017-08-06 DIAGNOSIS — F17210 Nicotine dependence, cigarettes, uncomplicated: Secondary | ICD-10-CM | POA: Diagnosis not present

## 2017-08-06 DIAGNOSIS — E669 Obesity, unspecified: Secondary | ICD-10-CM | POA: Insufficient documentation

## 2017-08-06 DIAGNOSIS — I272 Pulmonary hypertension, unspecified: Secondary | ICD-10-CM | POA: Insufficient documentation

## 2017-08-06 DIAGNOSIS — I471 Supraventricular tachycardia: Secondary | ICD-10-CM | POA: Insufficient documentation

## 2017-08-06 DIAGNOSIS — J449 Chronic obstructive pulmonary disease, unspecified: Secondary | ICD-10-CM | POA: Diagnosis not present

## 2017-08-06 DIAGNOSIS — Z8249 Family history of ischemic heart disease and other diseases of the circulatory system: Secondary | ICD-10-CM | POA: Diagnosis not present

## 2017-08-06 DIAGNOSIS — I4892 Unspecified atrial flutter: Secondary | ICD-10-CM | POA: Insufficient documentation

## 2017-08-06 DIAGNOSIS — F17200 Nicotine dependence, unspecified, uncomplicated: Secondary | ICD-10-CM | POA: Diagnosis not present

## 2017-08-06 DIAGNOSIS — Z9114 Patient's other noncompliance with medication regimen: Secondary | ICD-10-CM | POA: Insufficient documentation

## 2017-08-06 DIAGNOSIS — E7800 Pure hypercholesterolemia, unspecified: Secondary | ICD-10-CM | POA: Insufficient documentation

## 2017-08-06 DIAGNOSIS — Z794 Long term (current) use of insulin: Secondary | ICD-10-CM | POA: Insufficient documentation

## 2017-08-06 DIAGNOSIS — Z6837 Body mass index (BMI) 37.0-37.9, adult: Secondary | ICD-10-CM | POA: Insufficient documentation

## 2017-08-06 DIAGNOSIS — I429 Cardiomyopathy, unspecified: Secondary | ICD-10-CM | POA: Insufficient documentation

## 2017-08-06 DIAGNOSIS — Z7901 Long term (current) use of anticoagulants: Secondary | ICD-10-CM | POA: Diagnosis not present

## 2017-08-06 DIAGNOSIS — I2510 Atherosclerotic heart disease of native coronary artery without angina pectoris: Secondary | ICD-10-CM

## 2017-08-06 DIAGNOSIS — I110 Hypertensive heart disease with heart failure: Secondary | ICD-10-CM | POA: Diagnosis not present

## 2017-08-06 DIAGNOSIS — G4733 Obstructive sleep apnea (adult) (pediatric): Secondary | ICD-10-CM | POA: Insufficient documentation

## 2017-08-06 DIAGNOSIS — I480 Paroxysmal atrial fibrillation: Secondary | ICD-10-CM

## 2017-08-06 DIAGNOSIS — I5022 Chronic systolic (congestive) heart failure: Secondary | ICD-10-CM

## 2017-08-06 DIAGNOSIS — Z79899 Other long term (current) drug therapy: Secondary | ICD-10-CM | POA: Insufficient documentation

## 2017-08-06 DIAGNOSIS — Z9981 Dependence on supplemental oxygen: Secondary | ICD-10-CM | POA: Insufficient documentation

## 2017-08-06 DIAGNOSIS — Z9889 Other specified postprocedural states: Secondary | ICD-10-CM | POA: Diagnosis not present

## 2017-08-06 DIAGNOSIS — E119 Type 2 diabetes mellitus without complications: Secondary | ICD-10-CM | POA: Insufficient documentation

## 2017-08-06 LAB — BASIC METABOLIC PANEL
ANION GAP: 6 (ref 5–15)
BUN: 11 mg/dL (ref 6–20)
CALCIUM: 8.5 mg/dL — AB (ref 8.9–10.3)
CO2: 32 mmol/L (ref 22–32)
CREATININE: 0.83 mg/dL (ref 0.61–1.24)
Chloride: 99 mmol/L — ABNORMAL LOW (ref 101–111)
Glucose, Bld: 212 mg/dL — ABNORMAL HIGH (ref 65–99)
Potassium: 4.3 mmol/L (ref 3.5–5.1)
SODIUM: 137 mmol/L (ref 135–145)

## 2017-08-06 LAB — CBC
HCT: 33.7 % — ABNORMAL LOW (ref 39.0–52.0)
HEMOGLOBIN: 10 g/dL — AB (ref 13.0–17.0)
MCH: 25.9 pg — ABNORMAL LOW (ref 26.0–34.0)
MCHC: 29.7 g/dL — ABNORMAL LOW (ref 30.0–36.0)
MCV: 87.3 fL (ref 78.0–100.0)
PLATELETS: 360 10*3/uL (ref 150–400)
RBC: 3.86 MIL/uL — AB (ref 4.22–5.81)
RDW: 16.7 % — ABNORMAL HIGH (ref 11.5–15.5)
WBC: 7.7 10*3/uL (ref 4.0–10.5)

## 2017-08-06 MED ORDER — METOLAZONE 2.5 MG PO TABS: 2.5000 mg | ORAL_TABLET | ORAL | 0 refills | Status: DC

## 2017-08-06 MED ORDER — TORSEMIDE 20 MG PO TABS: 40.0000 mg | ORAL_TABLET | Freq: Two times a day (BID) | ORAL | 6 refills | Status: DC

## 2017-08-06 MED ORDER — SPIRONOLACTONE 25 MG PO TABS: 25.0000 mg | ORAL_TABLET | Freq: Every day | ORAL | 3 refills | Status: DC
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
## 2017-08-06 NOTE — Patient Instructions (Addendum)
INCREASE Spironolactone to 25 mg, one tab daily INCREASE Torsemide to 40 mg (2 tabs) twice a day TAKE Metolazone 2.5 mg one dose tomorrow   Labs today We will only contact you if something comes back abnormal or we need to make some changes. Otherwise no news is good news!   Your physician recommends that you schedule a follow-up appointment in: 4 weeks with Rebecca Eaton   Do the following things EVERYDAY: 1) Weigh yourself in the morning before breakfast. Write it down and keep it in a log. 2) Take your medicines as prescribed 3) Eat low salt foods-Limit salt (sodium) to 2000 mg per day.  4) Stay as active as you can everyday 5) Limit all fluids for the day to less than 2 liters

## 2017-08-06 NOTE — Progress Notes (Deleted)
Cardiology Office Note Date:  08/06/2017  Patient ID:  Nazario, Russom Mar 12, 1946, MRN [id number] PCP:  [facility]  CHF:  [name] Cardiologist: Dr. [name] Electrophysiologist: Dr. Caryl Comes, [place]  ***refresh   Chief Complaint: ***  History of Present Illness: Ion Gonnella is a 71 y.o. male with history of COPD on home O2, chronic CHF, NICM, HTN, DM, OSA noncompliant with CPAP.  He has been seen by Dr. Caryl Comes for possible long QT syndrome, Dr. Rayann Heman for AF/atrial arrhythmias.  He has hx of recurrent torsade de pointes managed with magnesium lidocaine and isoproterenol with repletion of potassium that had occurred in the context of vigorous diuresis following admission for respiratory failure. He been on chronic amiodarone for atrial arrhythmias. Multiple ECGs were reviewed. 1/18 was without QT prolongation. Notably he was also on methadone at arrival.   Hospitalization was also notable for bradycardia prompting the discontinuation of Cardizem.  Last seen by Dr. Caryl Comes in April at that time planned for epinephrine infusion, I don't see that this was completed  *** Symptoms *** Dr.Klein genetics? *** syncope *** meds *** fluid status  AFib Hx Not felt an ablation candidate AAD, not tikosyn candidate with prolonged QT, amiodarone stopped 2/2 QT, not flecainide 2/2 CM  Past Medical History:  Diagnosis Date  . Atrial flutter ([place])    a. recurrent AFlutter with RVR;  b. Amiodarone Rx started 4/16  . CAD (coronary artery disease)    a. LHC 1/16:  mLAD diffuse disease, pLCx mild disease, dLCx with disease but too small for PCI, RCA ok, EF 25-30%  . Chronic pain   . Chronic systolic CHF (congestive heart failure) ([place])   . COPD (chronic obstructive pulmonary disease) ([place])   . Diabetes mellitus without complication ([place])   . Hypercholesteremia   . Hypertension   . NICM (nonischemic cardiomyopathy) ([place])    a.dx 2016. b. 2D echo 06/2016 - Last echo 07/01/16: mod  dilated LV, mod LVH, EF 25-30%, mild-mod MR, sev LAE, mild-mod reduced RV systolic function, mild-mod TR, PASP 54mHG.  .Marland KitchenPAF (paroxysmal atrial fibrillation) ([facility])    On amio - ot a candidate for flecainide due to cardiomyopathy, not a candidate for Tikosyn due to prolonged QT, and felt to be a poor candidate for ablation given left atrial size.  . Prolonged Q-T interval on ECG   . Pulmonary hypertension ([place]   . Tobacco abuse     Past Surgical History:  Procedure Laterality Date  . LEFT AND RIGHT HEART CATHETERIZATION WITH CORONARY ANGIOGRAM N/A 11/06/2014   Procedure: LEFT AND RIGHT HEART CATHETERIZATION WITH CORONARY ANGIOGRAM;  Surgeon: JJettie Booze MD;  [facility];  Service: Cardiovascular;  Laterality: N/A;  . SPLENECTOMY      Current Outpatient Prescriptions  Medication Sig Dispense Refill  . albuterol (PROVENTIL HFA;VENTOLIN HFA) 108 (90 BASE) MCG/ACT inhaler Inhale 2 puffs into the lungs every 6 (six) hours as needed for wheezing or shortness of breath. 1 Inhaler 2  . carvedilol (COREG) 12.5 MG tablet Take 1 tablet (12.5 mg total) by mouth 2 (two) times daily with a meal. 60 tablet 0  . diazepam (VALIUM) 10 MG tablet Take 0.5 tablets (5 mg total) by mouth daily as needed for anxiety or sleep. 30 tablet 0  . diclofenac sodium (VOLTAREN) 1 % GEL Apply 2 g topically 2 (two) times daily as needed (for pain).    . fluticasone (FLONASE) 50 MCG/ACT nasal spray Place 2 sprays into both nostrils daily  as needed for allergies.     Marland Kitchen gabapentin (NEURONTIN) 600 MG tablet Take 1 tablet (600 mg total) by mouth 2 (two) times daily. 60 tablet 0  . glucose 4 GM chewable tablet Chew 4 tablets by mouth See admin instructions. Chew 4 tablets by mouth as needed for low blood sugar - repeat every 15 minutes if blood sugar less than 70    . hydrocortisone (ANUSOL-HC) 2.5 % rectal cream Place 1 application rectally 2 (two) times daily as needed for hemorrhoids or itching. Use up to 2  weeks at a time as needed    . hydrocortisone 1 % lotion Apply 1 application topically See admin instructions. Apply small amount to back twice daily for itchy rash    . insulin detemir (LEVEMIR) 100 UNIT/ML injection Inject 0.1 mLs (10 Units total) into the skin every evening. 10 mL 1  . ipratropium (ATROVENT) 0.02 % nebulizer solution Take 2.5 mLs (0.5 mg total) by nebulization every 6 (six) hours as needed for wheezing or shortness of breath. 75 mL 1  . magnesium oxide (MAG-OX) 400 (241.3 Mg) MG tablet Take 1 tablet (400 mg total) by mouth 2 (two) times daily. 30 tablet 3  . metFORMIN (GLUCOPHAGE-XR) 500 MG 24 hr tablet Take 1,000 mg by mouth 2 (two) times daily with a meal.    . metolazone (ZAROXOLYN) 2.5 MG tablet Take 1 tablet (2.5 mg total) by mouth as needed. Take only as directed. 5 tablet 0  . mometasone-formoterol (DULERA) 200-5 MCG/ACT AERO Inhale 2 puffs into the lungs 2 (two) times daily.    . potassium chloride SA (K-DUR,KLOR-CON) 20 MEQ tablet Take 2 tablets (40 mEq total) by mouth daily. 30 tablet 0  . rivaroxaban (XARELTO) 20 MG TABS tablet Take 1 tablet (20 mg total) by mouth daily with supper. 30 tablet 4  . rosuvastatin (CRESTOR) 40 MG tablet Take 0.5 tablets (20 mg total) by mouth daily. For cholesterol 45 tablet 3  . spironolactone (ALDACTONE) 25 MG tablet Take 0.5 tablets (12.5 mg total) by mouth daily. 45 tablet 3  . torsemide (DEMADEX) 20 MG tablet Take 40 mg (2 tabs) in am and 20 mg (1 tab) in pm 90 tablet 6   No current facility-administered medications for this visit.     Allergies:   Patient has no known allergies.   Social History:  The patient  reports that he has been smoking Cigarettes.  He has been smoking about 0.05 packs per day. He has never used smokeless tobacco. He reports that he does not drink alcohol or use drugs.   Family History:  The patient's family history includes Heart disease in his father and mother; Heart failure in his mother; Hypertension  in his mother and sister.  ROS:  Please see the history of present illness.  All other systems are reviewed and otherwise negative.   PHYSICAL EXAM: *** VS:  There were no vitals taken for this visit. BMI: There is no height or weight on file to calculate BMI. Well nourished, well developed, in no acute distress  HEENT: normocephalic, atraumatic  Neck: no JVD, carotid bruits or masses Cardiac:  *** RRR; no significant murmurs, no rubs, or gallops Lungs:  *** CTA b/l, no wheezing, rhonchi or rales  Abd: soft, nontender MS: no deformity or *** atrophy Ext: *** no edema  Skin: warm and dry, no rash Neuro:  No gross deficits appreciated Psych: euthymic mood, full affect  *** ICD site is stable, no tethering or discomfort  EKG:  Done today shows ***  12/13/16: TTE Study Conclusions - Left ventricle: The cavity size was normal. Wall thickness was   increased in a pattern of mild LVH. Systolic function was mildly   to moderately reduced. The estimated ejection fraction was in the   range of 40% to 45%. Diffuse hypokinesis. There is hypokinesis of   the basal-midinferior and inferoseptal myocardium. Features are   consistent with a pseudonormal left ventricular filling pattern,   with concomitant abnormal relaxation and increased filling   pressure (grade 2 diastolic dysfunction). - Left atrium: The atrium was moderately dilated. - Right ventricle: The cavity size was mildly dilated. Wall   thickness was normal. - Right atrium: The atrium was moderately dilated. - Pulmonary arteries: Systolic pressure was moderately increased.   PA peak pressure: 56 mm Hg (S). Impressions: - EF is mildly improved when compared to prior study (35%)  Recent Labs: 12/13/2016: ALT 17 01/07/2017: B Natriuretic Peptide 538.9 02/04/2017: Hemoglobin 10.6; Magnesium 1.8; Platelets 388 06/25/2017: BUN 11; Creatinine, Ser 0.83; Potassium 4.2; Sodium 140  11/01/2016: Cholesterol 124; HDL 38; LDL Cholesterol 67;  Total CHOL/HDL Ratio 3.3; Triglycerides 95; VLDL 19   CrCl cannot be calculated (Patient's most recent lab result is older than the maximum 21 days allowed.).   Wt Readings from Last 3 Encounters:  06/25/17 268 lb (121.6 kg)  04/30/17 270 lb (122.5 kg)  04/10/17 276 lb (125.2 kg)     Other studies reviewed: Additional studies/records reviewed today include: summarized above  ASSESSMENT AND PLAN:  1. Chronic CHF, systolic     ***  2. Paroxysmal AFib     CHA2DS2Vasc is at least 5, on Xarelto  3. HTN     ***  4. ? Long QT     ***  Disposition: F/u with ***  Current medicines are reviewed at length with the patient today.  The patient did not have any concerns regarding medicines.***  Signed, Tommye Standard, PA-C 08/06/2017 5:03 AM     [facility] [address] [place] [address] [phone] (office)  [phone] (fax)

## 2017-08-07 ENCOUNTER — Encounter: Payer: Self-pay | Admitting: Physician Assistant

## 2017-09-01 DIAGNOSIS — M792 Neuralgia and neuritis, unspecified: Secondary | ICD-10-CM | POA: Diagnosis not present

## 2017-09-01 DIAGNOSIS — M25559 Pain in unspecified hip: Secondary | ICD-10-CM | POA: Diagnosis not present

## 2017-09-01 DIAGNOSIS — M545 Low back pain: Secondary | ICD-10-CM | POA: Diagnosis not present

## 2017-09-01 DIAGNOSIS — Z79899 Other long term (current) drug therapy: Secondary | ICD-10-CM | POA: Diagnosis not present

## 2017-09-01 DIAGNOSIS — G894 Chronic pain syndrome: Secondary | ICD-10-CM | POA: Diagnosis not present

## 2017-09-01 DIAGNOSIS — Z79891 Long term (current) use of opiate analgesic: Secondary | ICD-10-CM | POA: Diagnosis not present

## 2017-09-02 ENCOUNTER — Telehealth (HOSPITAL_COMMUNITY): Payer: Self-pay

## 2017-09-02 NOTE — Telephone Encounter (Signed)
CHF Clinic appointment reminder call placed to patient for upcoming post-hospital follow up.  Attempted to call, no answer, no VM.  Patient also reminded to take all medications as prescribed on the day of his/her appointment and to bring all medications to this appointment.  Advised to call our office for tardiness or cancellations/rescheduling needs.  Leory Plowman, Guinevere Ferrari

## 2017-09-03 ENCOUNTER — Encounter (HOSPITAL_COMMUNITY): Payer: Medicare Other

## 2017-09-07 ENCOUNTER — Encounter (HOSPITAL_COMMUNITY): Payer: Medicare Other

## 2017-09-08 NOTE — Progress Notes (Deleted)
Electrophysiology Office Note Date: 09/08/2017  ID:  Nicholas Caldwell, DOB [birth year], MRN [id number]  PCP: [facility] Primary Cardiologist: [name] Electrophysiologist: Caryl Comes  CC: Routine follow-up  Nicholas Caldwell is a 71 y.o. male seen today for Dr Caryl Comes.  He presents today for routine electrophysiology followup.  Since last being seen in our clinic, the patient reports doing very well. He denies chest pain, palpitations, dyspnea, PND, orthopnea, nausea, vomiting, dizziness, syncope, edema, weight gain, or early satiety.     Past Medical History:  Diagnosis Date  . Atrial flutter ([place])    a. recurrent AFlutter with RVR;  b. Amiodarone Rx started 4/16  . CAD (coronary artery disease)    a. LHC 1/16:  mLAD diffuse disease, pLCx mild disease, dLCx with disease but too small for PCI, RCA ok, EF 25-30%  . Chronic pain   . Chronic systolic CHF (congestive heart failure) ([place])   . COPD (chronic obstructive pulmonary disease) ([place])   . Diabetes mellitus without complication ([place])   . Hypercholesteremia   . Hypertension   . NICM (nonischemic cardiomyopathy) ([place])    a.dx 2016. b. 2D echo 06/2016 - Last echo 07/01/16: mod dilated LV, mod LVH, EF 25-30%, mild-mod MR, sev LAE, mild-mod reduced RV systolic function, mild-mod TR, PASP 37mHG.  .Marland KitchenPAF (paroxysmal atrial fibrillation) (HCC)    On amio - ot a candidate for flecainide due to cardiomyopathy, not a candidate for Tikosyn due to prolonged QT, and felt to be a poor candidate for ablation given left atrial size.  . Prolonged Q-T interval on ECG   . Pulmonary hypertension ([place]   . Tobacco abuse    Past Surgical History:  Procedure Laterality Date  . SPLENECTOMY      Current Outpatient Medications  Medication Sig Dispense Refill  . albuterol (PROVENTIL HFA;VENTOLIN HFA) 108 (90 BASE) MCG/ACT inhaler Inhale 2 puffs into the lungs every 6 (six) hours as needed for wheezing or shortness of breath. 1 Inhaler 2  . carvedilol  (COREG) 12.5 MG tablet Take 1 tablet (12.5 mg total) by mouth 2 (two) times daily with a meal. 60 tablet 0  . diazepam (VALIUM) 10 MG tablet Take 0.5 tablets (5 mg total) by mouth daily as needed for anxiety or sleep. 30 tablet 0  . diclofenac sodium (VOLTAREN) 1 % GEL Apply 2 g topically 2 (two) times daily as needed (for pain).    . fluticasone (FLONASE) 50 MCG/ACT nasal spray Place 2 sprays into both nostrils daily as needed for allergies.     .Marland Kitchengabapentin (NEURONTIN) 600 MG tablet Take 1 tablet (600 mg total) by mouth 2 (two) times daily. 60 tablet 0  . glucose 4 GM chewable tablet Chew 4 tablets by mouth See admin instructions. Chew 4 tablets by mouth as needed for low blood sugar - repeat every 15 minutes if blood sugar less than 70    . hydrocortisone (ANUSOL-HC) 2.5 % rectal cream Place 1 application rectally 2 (two) times daily as needed for hemorrhoids or itching. Use up to 2 weeks at a time as needed    . hydrocortisone 1 % lotion Apply 1 application topically See admin instructions. Apply small amount to back twice daily for itchy rash    . insulin detemir (LEVEMIR) 100 UNIT/ML injection Inject 0.1 mLs (10 Units total) into the skin every evening. 10 mL 1  . ipratropium (ATROVENT) 0.02 % nebulizer solution Take 2.5 mLs (0.5 mg total) by nebulization every 6 (six) hours as needed  for wheezing or shortness of breath. 75 mL 1  . magnesium oxide (MAG-OX) 400 (241.3 Mg) MG tablet Take 1 tablet (400 mg total) by mouth 2 (two) times daily. 30 tablet 3  . metFORMIN (GLUCOPHAGE-XR) 500 MG 24 hr tablet Take 1,000 mg by mouth 2 (two) times daily with a meal.    . metolazone (ZAROXOLYN) 2.5 MG tablet Take 1 tablet (2.5 mg total) by mouth as directed. 5 tablet 0  . mometasone-formoterol (DULERA) 200-5 MCG/ACT AERO Inhale 2 puffs into the lungs 2 (two) times daily.    . potassium chloride SA (K-DUR,KLOR-CON) 20 MEQ tablet Take 2 tablets (40 mEq total) by mouth daily. 30 tablet 0  . rivaroxaban (XARELTO)  20 MG TABS tablet Take 1 tablet (20 mg total) by mouth daily with supper. 30 tablet 4  . rosuvastatin (CRESTOR) 40 MG tablet Take 0.5 tablets (20 mg total) by mouth daily. For cholesterol 45 tablet 3  . spironolactone (ALDACTONE) 25 MG tablet Take 1 tablet (25 mg total) by mouth daily. 90 tablet 3  . torsemide (DEMADEX) 20 MG tablet Take 2 tablets (40 mg total) by mouth 2 (two) times daily. 120 tablet 6   No current facility-administered medications for this visit.     Allergies:   Patient has no known allergies.   Social History: Social History   Socioeconomic History  . Marital status: Single    Spouse name: Not on file  . Number of children: Not on file  . Years of education: Not on file  . Highest education level: Not on file  Social Needs  . Financial resource strain: Not on file  . Food insecurity - worry: Not on file  . Food insecurity - inability: Not on file  . Transportation needs - medical: Not on file  . Transportation needs - non-medical: Not on file  Occupational History  . Not on file  Tobacco Use  . Smoking status: Current Every Day Smoker    Packs/day: 0.05    Types: Cigarettes    Last attempt to quit: 08/30/2015    Years since quitting: 2.0  . Smokeless tobacco: Never Used  Substance and Sexual Activity  . Alcohol use: No    Alcohol/week: 0.0 oz  . Drug use: No  . Sexual activity: Not on file  Other Topics Concern  . Not on file  Social History Narrative  . Not on file    Family History: Family History  Problem Relation Age of Onset  . Heart disease Mother   . Hypertension Mother   . Heart failure Mother   . Heart disease Father   . Hypertension Sister   . Heart attack Neg Hx   . Stroke Neg Hx     Review of Systems: All other systems reviewed and are otherwise negative except as noted above.   Physical Exam: VS:  There were no vitals taken for this visit. , BMI There is no height or weight on file to calculate BMI.  GEN- The patient is  well appearing, alert and oriented x 3 today.   HEENT: normocephalic, atraumatic; sclera clear, conjunctiva pink; hearing intact; oropharynx clear; neck supple, no JVP Lymph- no cervical lymphadenopathy Lungs- Clear to ausculation bilaterally, normal work of breathing.  No wheezes, rales, rhonchi Heart- Regular rate and rhythm, no murmurs, rubs or gallops, PMI not laterally displaced GI- soft, non-tender, non-distended, bowel sounds present, no hepatosplenomegaly Extremities- no clubbing, cyanosis, or edema; DP/PT/radial pulses 2+ bilaterally MS- no significant deformity or atrophy  Skin- warm and dry, no rash or lesion; ICD pocket well healed Psych- euthymic mood, full affect Neuro- strength and sensation are intact  ICD interrogation- reviewed in detail today,  See PACEART report  EKG:  EKG is ordered today. The ekg ordered today shows ***  Recent Labs: 12/13/2016: ALT 17 01/07/2017: B Natriuretic Peptide 538.9 02/04/2017: Magnesium 1.8 08/06/2017: BUN 11; Creatinine, Ser 0.83; Hemoglobin 10.0; Platelets 360; Potassium 4.3; Sodium 137   Wt Readings from Last 3 Encounters:  08/06/17 277 lb 3.2 oz (125.7 kg)  06/25/17 268 lb (121.6 kg)  04/30/17 270 lb (122.5 kg)     Other studies Reviewed: Additional studies/ records that were reviewed today include: AHF notes, Dr Olin Pia office notes   Assessment and Plan:  1.  Chronic systolic dysfunction euvolemic today Stable on an appropriate medical regimen Followed by AHF clinic  2.  Prolonged QT Stable by EKG today There is mention of genetic testing in Dr Olin Pia last note I do not see that this has been done He has declined ICD in the past Will route note to Dr Caryl Comes for further review of plan  3.  Persistent AF Not a candidate for ablation No AAD options with prolonged QT and prior torsades Continue [place] for CHADS2VASC of 4   Current medicines are reviewed at length with the patient today.   The patient does not have concerns  regarding his medicines.  The following changes were made today:  none  Labs/ tests ordered today include: none No orders of the defined types were placed in this encounter.    Disposition:   Follow up with Dr Caryl Comes 6 months, AHF clinic as scheduled     Signed, Chanetta Marshall, NP 09/08/2017 4:57 PM  [address] [place] [address] [phone] (office) [phone] (fax)

## 2017-09-09 ENCOUNTER — Ambulatory Visit: Payer: Medicare Other | Admitting: Nurse Practitioner

## 2017-09-18 ENCOUNTER — Encounter (HOSPITAL_COMMUNITY): Payer: Medicare Other

## 2017-09-29 ENCOUNTER — Telehealth (HOSPITAL_COMMUNITY): Payer: Self-pay

## 2017-09-29 DIAGNOSIS — G894 Chronic pain syndrome: Secondary | ICD-10-CM | POA: Diagnosis not present

## 2017-09-29 DIAGNOSIS — M25559 Pain in unspecified hip: Secondary | ICD-10-CM | POA: Diagnosis not present

## 2017-09-29 DIAGNOSIS — M545 Low back pain: Secondary | ICD-10-CM | POA: Diagnosis not present

## 2017-09-29 DIAGNOSIS — Z79899 Other long term (current) drug therapy: Secondary | ICD-10-CM | POA: Diagnosis not present

## 2017-09-29 DIAGNOSIS — M792 Neuralgia and neuritis, unspecified: Secondary | ICD-10-CM | POA: Diagnosis not present

## 2017-09-29 DIAGNOSIS — Z79891 Long term (current) use of opiate analgesic: Secondary | ICD-10-CM | POA: Diagnosis not present

## 2017-09-29 NOTE — Telephone Encounter (Signed)
CHF Clinic appointment reminder call placed to patient for upcoming post-hospital follow up.  LVMTCB to confirm apt  Patient also reminded to take all medications as prescribed on the day of his/her appointment and to bring all medications to this appointment.  Advised to call our office for tardiness or cancellations/rescheduling needs.  Leory Plowman, Guinevere Ferrari

## 2017-09-30 ENCOUNTER — Ambulatory Visit (HOSPITAL_COMMUNITY)
Admission: RE | Admit: 2017-09-30 | Discharge: 2017-09-30 | Disposition: A | Discharge status: Home / Self Care | Payer: Medicare Other | Source: Ambulatory Visit | Attending: Internal Medicine | Admitting: Internal Medicine

## 2017-09-30 ENCOUNTER — Encounter (HOSPITAL_COMMUNITY): Payer: Self-pay

## 2017-09-30 VITALS — BP 122/62 | HR 63 | Wt 276.0 lb

## 2017-09-30 DIAGNOSIS — I272 Pulmonary hypertension, unspecified: Secondary | ICD-10-CM | POA: Insufficient documentation

## 2017-09-30 DIAGNOSIS — Z7901 Long term (current) use of anticoagulants: Secondary | ICD-10-CM | POA: Diagnosis not present

## 2017-09-30 DIAGNOSIS — F17210 Nicotine dependence, cigarettes, uncomplicated: Secondary | ICD-10-CM | POA: Diagnosis not present

## 2017-09-30 DIAGNOSIS — Z794 Long term (current) use of insulin: Secondary | ICD-10-CM | POA: Insufficient documentation

## 2017-09-30 DIAGNOSIS — Z9981 Dependence on supplemental oxygen: Secondary | ICD-10-CM | POA: Diagnosis not present

## 2017-09-30 DIAGNOSIS — I480 Paroxysmal atrial fibrillation: Secondary | ICD-10-CM | POA: Diagnosis not present

## 2017-09-30 DIAGNOSIS — E119 Type 2 diabetes mellitus without complications: Secondary | ICD-10-CM | POA: Insufficient documentation

## 2017-09-30 DIAGNOSIS — I5022 Chronic systolic (congestive) heart failure: Secondary | ICD-10-CM | POA: Diagnosis not present

## 2017-09-30 DIAGNOSIS — J441 Chronic obstructive pulmonary disease with (acute) exacerbation: Secondary | ICD-10-CM | POA: Diagnosis not present

## 2017-09-30 DIAGNOSIS — I2510 Atherosclerotic heart disease of native coronary artery without angina pectoris: Secondary | ICD-10-CM | POA: Diagnosis not present

## 2017-09-30 DIAGNOSIS — Z9114 Patient's other noncompliance with medication regimen: Secondary | ICD-10-CM | POA: Diagnosis not present

## 2017-09-30 DIAGNOSIS — I110 Hypertensive heart disease with heart failure: Secondary | ICD-10-CM | POA: Diagnosis not present

## 2017-09-30 DIAGNOSIS — J449 Chronic obstructive pulmonary disease, unspecified: Secondary | ICD-10-CM | POA: Diagnosis not present

## 2017-09-30 DIAGNOSIS — Z79899 Other long term (current) drug therapy: Secondary | ICD-10-CM | POA: Diagnosis not present

## 2017-09-30 DIAGNOSIS — E7800 Pure hypercholesterolemia, unspecified: Secondary | ICD-10-CM | POA: Diagnosis not present

## 2017-09-30 DIAGNOSIS — G4733 Obstructive sleep apnea (adult) (pediatric): Secondary | ICD-10-CM | POA: Insufficient documentation

## 2017-09-30 LAB — CBC
HEMATOCRIT: 34.9 % — AB (ref 39.0–52.0)
HEMOGLOBIN: 10 g/dL — AB (ref 13.0–17.0)
MCH: 25.5 pg — AB (ref 26.0–34.0)
MCHC: 28.7 g/dL — ABNORMAL LOW (ref 30.0–36.0)
MCV: 89 fL (ref 78.0–100.0)
Platelets: 280 10*3/uL (ref 150–400)
RBC: 3.92 MIL/uL — AB (ref 4.22–5.81)
RDW: 16.3 % — ABNORMAL HIGH (ref 11.5–15.5)
WBC: 6.7 10*3/uL (ref 4.0–10.5)

## 2017-09-30 LAB — BASIC METABOLIC PANEL
ANION GAP: 5 (ref 5–15)
BUN: 13 mg/dL (ref 6–20)
CHLORIDE: 103 mmol/L (ref 101–111)
CO2: 31 mmol/L (ref 22–32)
Calcium: 8.4 mg/dL — ABNORMAL LOW (ref 8.9–10.3)
Creatinine, Ser: 0.79 mg/dL (ref 0.61–1.24)
GFR calc non Af Amer: >60 mL/min (ref >60)
GLUCOSE: 113 mg/dL — AB (ref 65–99)
Potassium: 4.4 mmol/L (ref 3.5–5.1)
Sodium: 139 mmol/L (ref 135–145)

## 2017-09-30 MED ORDER — DOXYCYCLINE HYCLATE 100 MG PO TABS: 100.0000 mg | ORAL_TABLET | Freq: Two times a day (BID) | ORAL | 0 refills | Status: DC

## 2017-09-30 NOTE — Patient Instructions (Signed)
Routine lab work today. Will notify you of abnormal results, otherwise no news is good news!  Take Doxycycline 100 mg tablet twice daily for 7 days (until bottle empty).  Follow up 6-8 weeks with Oda Kilts PA-C.  Take all medication as prescribed the day of your appointment. Bring all medications with you to your appointment.  Do the following things EVERYDAY: 1) Weigh yourself in the morning before breakfast. Write it down and keep it in a log. 2) Take your medicines as prescribed 3) Eat low salt foods-Limit salt (sodium) to 2000 mg per day.  4) Stay as active as you can everyday 5) Limit all fluids for the day to less than 2 liters
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
## 2017-10-14 DIAGNOSIS — G894 Chronic pain syndrome: Secondary | ICD-10-CM | POA: Diagnosis not present

## 2017-10-14 DIAGNOSIS — Z79899 Other long term (current) drug therapy: Secondary | ICD-10-CM | POA: Diagnosis not present

## 2017-10-14 DIAGNOSIS — Z79891 Long term (current) use of opiate analgesic: Secondary | ICD-10-CM | POA: Diagnosis not present

## 2017-10-14 DIAGNOSIS — M792 Neuralgia and neuritis, unspecified: Secondary | ICD-10-CM | POA: Diagnosis not present

## 2017-10-20 NOTE — Progress Notes (Signed)
Electrophysiology Office Note Date: 10/23/2017  ID:  Nicholas Caldwell, DOB 02-29-1946, MRN [id number]  PCP: [facility] Primary Cardiologist: [name] Electrophysiologist: Caryl Comes  CC: long QT follow up  Nicholas Caldwell is a 71 y.o. male seen today for Dr Caryl Comes.  He presents today for routine electrophysiology followup.  Since last being seen in our clinic, the patient reports doing reasonably well. His wife called requesting this appointment for blood work and follow up.  At last visit with Dr Caryl Comes, there was discussion about an epi challenge but it is unclear why this was not scheduled.  He denies cardiac related issues today. Breathing is stable. He has not noticed palpitations or arrhythmias.  He has not had dizziness, pre-syncope, or syncope. His biggest concern today is that his pain clinic will no longer see him because when they tested his urine, the pain medication he was on was not detected. He asks today if the Torsemide could have caused the lab abnormality.  He denies chest pain, palpitations, dyspnea, PND, orthopnea, nausea, vomiting, dizziness, syncope, edema, weight gain, or early satiety.  Past Medical History:  Diagnosis Date  . Atrial flutter ([place])    a. recurrent AFlutter with RVR;  b. Amiodarone Rx started 4/16  . CAD (coronary artery disease)    a. LHC 1/16:  mLAD diffuse disease, pLCx mild disease, dLCx with disease but too small for PCI, RCA ok, EF 25-30%  . Chronic pain   . Chronic systolic CHF (congestive heart failure) ([place])   . COPD (chronic obstructive pulmonary disease) ([place])   . Diabetes mellitus without complication ([place])   . Hypercholesteremia   . Hypertension   . NICM (nonischemic cardiomyopathy) ([place])    a.dx 2016. b. 2D echo 06/2016 - Last echo 07/01/16: mod dilated LV, mod LVH, EF 25-30%, mild-mod MR, sev LAE, mild-mod reduced RV systolic function, mild-mod TR, PASP 62mHG.  .Marland KitchenPAF (paroxysmal atrial fibrillation) ([facility])    On amio - ot a candidate  for flecainide due to cardiomyopathy, not a candidate for Tikosyn due to prolonged QT, and felt to be a poor candidate for ablation given left atrial size.  . Prolonged Q-T interval on ECG   . Pulmonary hypertension ([place]   . Tobacco abuse    Past Surgical History:  Procedure Laterality Date  . LEFT AND RIGHT HEART CATHETERIZATION WITH CORONARY ANGIOGRAM N/A 11/06/2014   Procedure: LEFT AND RIGHT HEART CATHETERIZATION WITH CORONARY ANGIOGRAM;  Surgeon: JJettie Booze MD;  [facility];  Service: Cardiovascular;  Laterality: N/A;  . SPLENECTOMY      Current Outpatient Medications  Medication Sig Dispense Refill  . albuterol (PROVENTIL HFA;VENTOLIN HFA) 108 (90 BASE) MCG/ACT inhaler Inhale 2 puffs into the lungs every 6 (six) hours as needed for wheezing or shortness of breath. 1 Inhaler 2  . carvedilol (COREG) 12.5 MG tablet Take 1 tablet (12.5 mg total) by mouth 2 (two) times daily with a meal. 60 tablet 0  . diazepam (VALIUM) 10 MG tablet Take 0.5 tablets (5 mg total) by mouth daily as needed for anxiety or sleep. 30 tablet 0  . diclofenac sodium (VOLTAREN) 1 % GEL Apply 2 g topically 2 (two) times daily as needed (for pain).    .Marland Kitchendoxycycline (VIBRA-TABS) 100 MG tablet Take 1 tablet (100 mg total) by mouth 2 (two) times daily. 14 tablet 0  . fluticasone (FLONASE) 50 MCG/ACT nasal spray Place 2 sprays into both nostrils daily as needed for allergies.     .Marland Kitchen  gabapentin (NEURONTIN) 600 MG tablet Take 1 tablet (600 mg total) by mouth 2 (two) times daily. 60 tablet 0  . glucose 4 GM chewable tablet Chew 4 tablets by mouth See admin instructions. Chew 4 tablets by mouth as needed for low blood sugar - repeat every 15 minutes if blood sugar less than 70    . hydrocortisone (ANUSOL-HC) 2.5 % rectal cream Place 1 application rectally 2 (two) times daily as needed for hemorrhoids or itching. Use up to 2 weeks at a time as needed    . hydrocortisone 1 % lotion Apply 1 application topically  See admin instructions. Apply small amount to back twice daily for itchy rash    . insulin aspart (NOVOLOG) 100 UNIT/ML injection Inject 30 Units into the skin 2 (two) times daily.    Marland Kitchen ipratropium (ATROVENT) 0.02 % nebulizer solution Take 2.5 mLs (0.5 mg total) by nebulization every 6 (six) hours as needed for wheezing or shortness of breath. 75 mL 1  . magnesium oxide (MAG-OX) 400 (241.3 Mg) MG tablet Take 1 tablet (400 mg total) by mouth 2 (two) times daily. 30 tablet 3  . metFORMIN (GLUCOPHAGE-XR) 500 MG 24 hr tablet Take 1,000 mg by mouth 2 (two) times daily with a meal.    . metolazone (ZAROXOLYN) 2.5 MG tablet Take 1 tablet (2.5 mg total) by mouth as directed. 5 tablet 0  . mometasone-formoterol (DULERA) 200-5 MCG/ACT AERO Inhale 2 puffs into the lungs 2 (two) times daily.    . potassium chloride SA (K-DUR,KLOR-CON) 20 MEQ tablet Take 2 tablets (40 mEq total) by mouth daily. 30 tablet 0  . rivaroxaban (XARELTO) 20 MG TABS tablet Take 1 tablet (20 mg total) by mouth daily with supper. 30 tablet 4  . rosuvastatin (CRESTOR) 40 MG tablet Take 0.5 tablets (20 mg total) by mouth daily. For cholesterol 45 tablet 3  . torsemide (DEMADEX) 20 MG tablet Take 2 tablets (40 mg total) by mouth 2 (two) times daily. 120 tablet 6   No current facility-administered medications for this visit.     Allergies:   Patient has no known allergies.   Social History: Social History   Socioeconomic History  . Marital status: Single    Spouse name: Not on file  . Number of children: Not on file  . Years of education: Not on file  . Highest education level: Not on file  Social Needs  . Financial resource strain: Not on file  . Food insecurity - worry: Not on file  . Food insecurity - inability: Not on file  . Transportation needs - medical: Not on file  . Transportation needs - non-medical: Not on file  Occupational History  . Not on file  Tobacco Use  . Smoking status: Current Every Day Smoker     Packs/day: 0.05    Types: Cigarettes    Last attempt to quit: 08/30/2015    Years since quitting: 2.1  . Smokeless tobacco: Never Used  Substance and Sexual Activity  . Alcohol use: No    Alcohol/week: 0.0 oz  . Drug use: No  . Sexual activity: Not on file  Other Topics Concern  . Not on file  Social History Narrative  . Not on file    Family History: Family History  Problem Relation Age of Onset  . Heart disease Mother   . Hypertension Mother   . Heart failure Mother   . Heart disease Father   . Hypertension Sister   . Heart attack  Neg Hx   . Stroke Neg Hx     Review of Systems: All other systems reviewed and are otherwise negative except as noted above.   Physical Exam: VS:  BP 128/68   Pulse 64   Ht 6' (1.829 m)   Wt 268 lb (121.6 kg)   SpO2 93%   BMI 36.35 kg/m  , BMI Body mass index is 36.35 kg/m. Wt Readings from Last 3 Encounters:  10/22/17 268 lb (121.6 kg)  09/30/17 276 lb (125.2 kg)  08/06/17 277 lb 3.2 oz (125.7 kg)    GEN- The patient is elderly appearing, in a wheelchair HEENT: normocephalic, atraumatic; sclera clear, conjunctiva pink; hearing intact; oropharynx clear; neck supple Lungs- Clear to ausculation bilaterally, normal work of breathing.  No wheezes, rales, rhonchi Heart- Regular rate and rhythm  GI- soft, non-tender, non-distended, bowel sounds present  Extremities- no clubbing, cyanosis, + dependent BLE edema MS- no significant deformity or atrophy Skin- warm and dry, no rash or lesion  Psych- euthymic mood, full affect Neuro- strength and sensation are intact   EKG:  EKG is not ordered today.  Recent Labs: 12/13/2016: ALT 17 01/07/2017: B Natriuretic Peptide 538.9 02/04/2017: Magnesium 1.8 09/30/2017: BUN 13; Creatinine, Ser 0.79; Hemoglobin 10.0; Platelets 280; Potassium 4.4; Sodium 139    Other studies Reviewed: Additional studies/ records that were reviewed today include:Dr Klein's office notes, AHF notes  Assessment and  Plan: 1.  Probable long QT syndrome Pt has declined ICD  Continue BB No recent pre-syncope or syncope Keep K>3.9, Mg >1.8 (recent BMET reviewed) There was discussion about epi infusion in Dr Olin Pia last note, but I am unable to tell why this wasn't done. Will defer to Dr Caryl Comes need to schedule  2.  Persistent atrial arrhythmias Not an ablation candidate 2/2 severe LAE Medication options are limited with long QT and CAD Fortunately, maintaining SR presently Continue [place] long term for CHADS2VASC of 4  3.  Chronic systolic heart failure Euvolemic on exam Continue current therapy Followed by AHF clinic   Current medicines are reviewed at length with the patient today.   The patient does not have concerns regarding his medicines.  The following changes were made today:  none  Labs/ tests ordered today include: none Orders Placed This Encounter  Procedures  . EKG 12-Lead     Disposition:   Follow up with Dr Caryl Comes 6 months, AHF as scheduled      Signed, Chanetta Marshall, NP 10/23/2017 6:18 AM   [place] [address] [phone] (office) [phone] (fax)

## 2017-10-22 ENCOUNTER — Ambulatory Visit (INDEPENDENT_AMBULATORY_CARE_PROVIDER_SITE_OTHER): Payer: Medicare Other | Admitting: Nurse Practitioner

## 2017-10-22 ENCOUNTER — Encounter: Payer: Self-pay | Admitting: Nurse Practitioner

## 2017-10-22 VITALS — BP 128/68 | HR 64 | Ht 72.0 in | Wt 268.0 lb

## 2017-10-22 DIAGNOSIS — I481 Persistent atrial fibrillation: Secondary | ICD-10-CM | POA: Diagnosis not present

## 2017-10-22 DIAGNOSIS — I5022 Chronic systolic (congestive) heart failure: Secondary | ICD-10-CM | POA: Diagnosis not present

## 2017-10-22 DIAGNOSIS — R9431 Abnormal electrocardiogram [ECG] [EKG]: Secondary | ICD-10-CM

## 2017-10-22 DIAGNOSIS — I4819 Other persistent atrial fibrillation: Secondary | ICD-10-CM

## 2017-10-22 DIAGNOSIS — I2510 Atherosclerotic heart disease of native coronary artery without angina pectoris: Secondary | ICD-10-CM

## 2017-10-22 NOTE — Patient Instructions (Signed)
Medication Instructions:   Your physician recommends that you continue on your current medications as directed. Please refer to the Current Medication list given to you today.   If you need a refill on your cardiac medications before your next appointment, please call your pharmacy.  Labwork:  NONE ORDERED  TODAY    Testing/Procedures: NONE ORDERED  TODAY    Follow-Up:  Your physician wants you to follow-up in:  IN  [place] will receive a reminder letter in the mail two months in advance. If you don't receive a letter, please call our office to schedule the follow-up appointment.      Any Other Special Instructions Will Be Listed Below (If Applicable).

## 2017-10-28 IMAGING — DX DG CHEST 2V
2 series · 2 of 2 positions shown · non-contrast
Comparison: PA and lateral chest 02/23/2015 and 10/31/2014.

CLINICAL DATA: Productive cough for 2 weeks.

EXAM:
CHEST  2 VIEW

[chest lat]
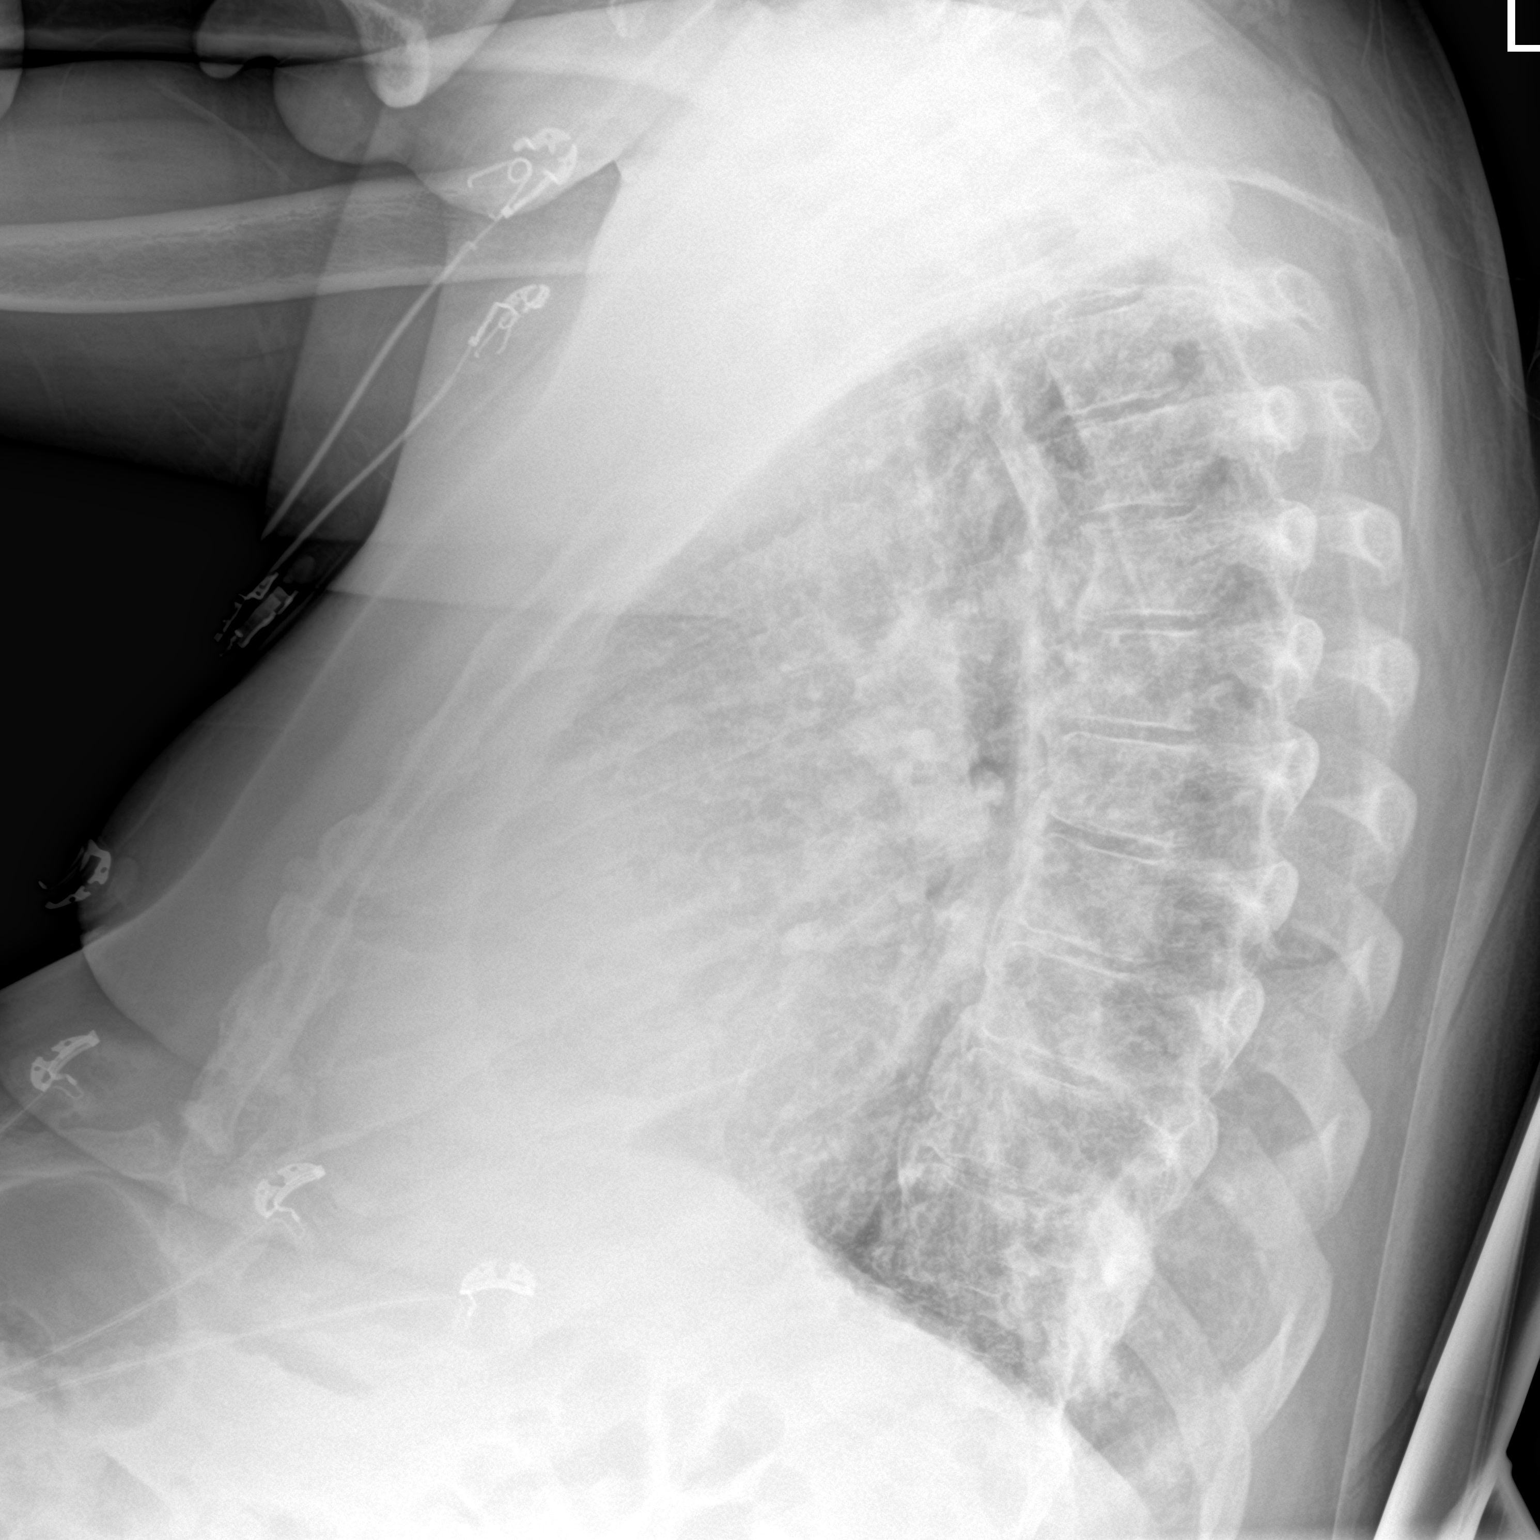

[chest ap]
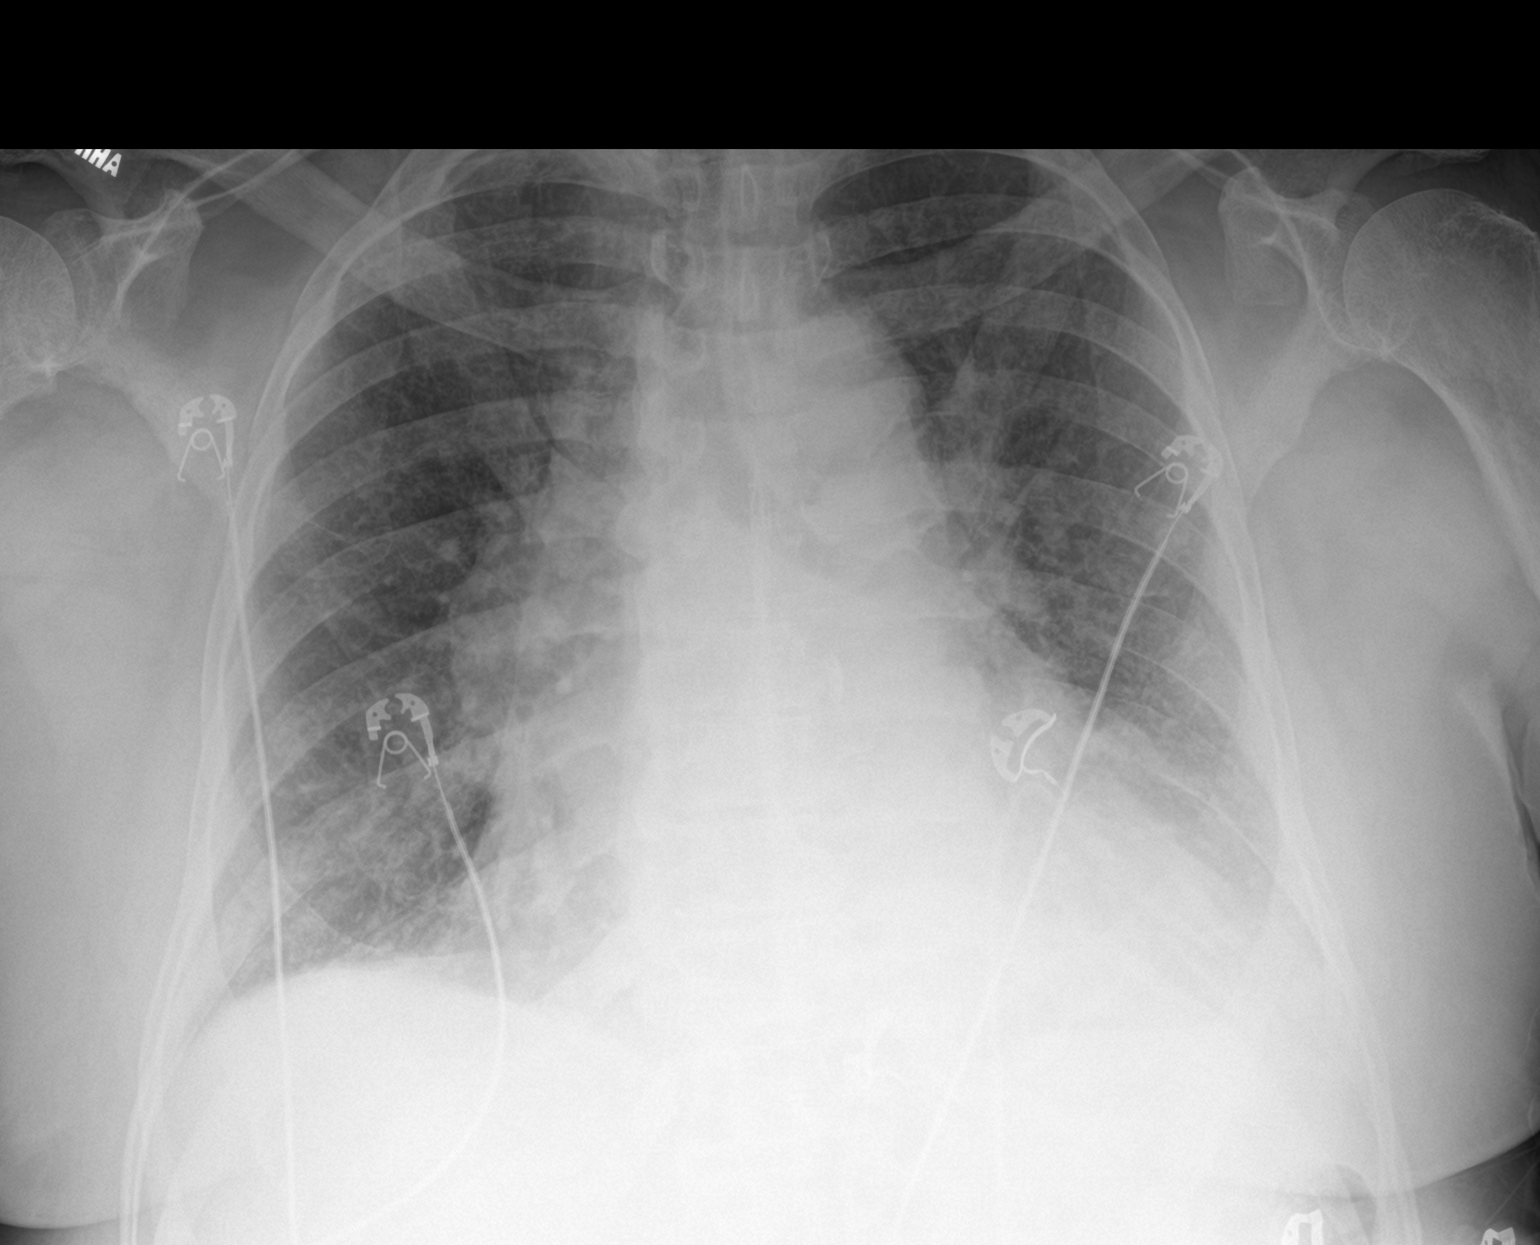

[2 of 2 positions shown; findings below may reference images not displayed]

FINDINGS: There is cardiomegaly and vascular congestion. No consolidative
process, pneumothorax or effusion is identified. No focal bony
abnormality.
IMPRESSION: Cardiomegaly and pulmonary vascular congestion.

## 2017-11-03 DIAGNOSIS — Z87891 Personal history of nicotine dependence: Secondary | ICD-10-CM

## 2017-11-03 HISTORY — DX: Personal history of nicotine dependence: Z87.891

## 2017-11-11 ENCOUNTER — Encounter (HOSPITAL_COMMUNITY): Payer: Medicare Other

## 2017-11-23 ENCOUNTER — Encounter (HOSPITAL_COMMUNITY): Payer: Medicare Other

## 2017-11-30 ENCOUNTER — Encounter (HOSPITAL_COMMUNITY): Payer: Medicare Other

## 2017-12-08 ENCOUNTER — Encounter (HOSPITAL_COMMUNITY): Payer: Self-pay

## 2017-12-08 ENCOUNTER — Ambulatory Visit (HOSPITAL_COMMUNITY)
Admission: RE | Admit: 2017-12-08 | Discharge: 2017-12-08 | Disposition: A | Discharge status: Home / Self Care | Payer: Medicare Other | Source: Ambulatory Visit | Attending: Cardiology | Admitting: Cardiology

## 2017-12-08 VITALS — BP 138/70 | HR 71 | Wt 286.4 lb

## 2017-12-08 DIAGNOSIS — Z9981 Dependence on supplemental oxygen: Secondary | ICD-10-CM | POA: Insufficient documentation

## 2017-12-08 DIAGNOSIS — I4892 Unspecified atrial flutter: Secondary | ICD-10-CM | POA: Diagnosis not present

## 2017-12-08 DIAGNOSIS — E119 Type 2 diabetes mellitus without complications: Secondary | ICD-10-CM | POA: Diagnosis not present

## 2017-12-08 DIAGNOSIS — J9611 Chronic respiratory failure with hypoxia: Secondary | ICD-10-CM | POA: Insufficient documentation

## 2017-12-08 DIAGNOSIS — E7800 Pure hypercholesterolemia, unspecified: Secondary | ICD-10-CM | POA: Diagnosis not present

## 2017-12-08 DIAGNOSIS — I5022 Chronic systolic (congestive) heart failure: Secondary | ICD-10-CM | POA: Diagnosis not present

## 2017-12-08 DIAGNOSIS — Z87891 Personal history of nicotine dependence: Secondary | ICD-10-CM | POA: Insufficient documentation

## 2017-12-08 DIAGNOSIS — Z794 Long term (current) use of insulin: Secondary | ICD-10-CM | POA: Insufficient documentation

## 2017-12-08 DIAGNOSIS — I480 Paroxysmal atrial fibrillation: Secondary | ICD-10-CM | POA: Insufficient documentation

## 2017-12-08 DIAGNOSIS — I110 Hypertensive heart disease with heart failure: Secondary | ICD-10-CM | POA: Insufficient documentation

## 2017-12-08 DIAGNOSIS — I272 Pulmonary hypertension, unspecified: Secondary | ICD-10-CM | POA: Diagnosis not present

## 2017-12-08 DIAGNOSIS — Z79899 Other long term (current) drug therapy: Secondary | ICD-10-CM | POA: Insufficient documentation

## 2017-12-08 DIAGNOSIS — I2510 Atherosclerotic heart disease of native coronary artery without angina pectoris: Secondary | ICD-10-CM | POA: Diagnosis not present

## 2017-12-08 DIAGNOSIS — J441 Chronic obstructive pulmonary disease with (acute) exacerbation: Secondary | ICD-10-CM

## 2017-12-08 DIAGNOSIS — I428 Other cardiomyopathies: Secondary | ICD-10-CM | POA: Insufficient documentation

## 2017-12-08 DIAGNOSIS — Z9114 Patient's other noncompliance with medication regimen: Secondary | ICD-10-CM | POA: Diagnosis not present

## 2017-12-08 DIAGNOSIS — J449 Chronic obstructive pulmonary disease, unspecified: Secondary | ICD-10-CM | POA: Diagnosis not present

## 2017-12-08 DIAGNOSIS — G4733 Obstructive sleep apnea (adult) (pediatric): Secondary | ICD-10-CM | POA: Insufficient documentation

## 2017-12-08 DIAGNOSIS — Z7901 Long term (current) use of anticoagulants: Secondary | ICD-10-CM | POA: Diagnosis not present

## 2017-12-08 DIAGNOSIS — F17200 Nicotine dependence, unspecified, uncomplicated: Secondary | ICD-10-CM

## 2017-12-08 LAB — BASIC METABOLIC PANEL
ANION GAP: 12 (ref 5–15)
BUN: 12 mg/dL (ref 6–20)
CALCIUM: 8.6 mg/dL — AB (ref 8.9–10.3)
CO2: 29 mmol/L (ref 22–32)
CREATININE: 0.79 mg/dL (ref 0.61–1.24)
Chloride: 100 mmol/L — ABNORMAL LOW (ref 101–111)
Glucose, Bld: 150 mg/dL — ABNORMAL HIGH (ref 65–99)
Potassium: 4.1 mmol/L (ref 3.5–5.1)
SODIUM: 141 mmol/L (ref 135–145)

## 2017-12-08 MED ORDER — SPIRONOLACTONE 25 MG PO TABS: 12.5000 mg | ORAL_TABLET | Freq: Every day | ORAL | 11 refills | Status: DC

## 2017-12-08 MED ORDER — DOXYCYCLINE HYCLATE 50 MG PO CAPS: 100.0000 mg | ORAL_CAPSULE | Freq: Two times a day (BID) | ORAL | 0 refills | Status: DC
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
## 2017-12-08 NOTE — Patient Instructions (Signed)
RESTART Spironolactone 12.5 mg, one half tab daily at bedtime.  Labs today We will only contact you if something comes back abnormal or we need to make some changes. Otherwise no news is good news!  Labs needed in 7-10 days   You have been referred to [facility] for further management of your COPD   Address: [address] [place], [place], [place] [address] Phone: [phone]  Do the following things EVERYDAY: 1) Weigh yourself in the morning before breakfast. Write it down and keep it in a log. 2) Take your medicines as prescribed 3) Eat low salt foods-Limit salt (sodium) to 2000 mg per day.  4) Stay as active as you can everyday 5) Limit all fluids for the day to less than 2 liters

## 2017-12-18 ENCOUNTER — Other Ambulatory Visit (HOSPITAL_COMMUNITY): Payer: Self-pay

## 2017-12-18 ENCOUNTER — Other Ambulatory Visit (HOSPITAL_COMMUNITY): Payer: Medicare Other

## 2017-12-18 ENCOUNTER — Inpatient Hospital Stay (HOSPITAL_COMMUNITY): Admission: RE | Admit: 2017-12-18 | Payer: Medicare Other | Source: Ambulatory Visit

## 2017-12-18 DIAGNOSIS — I5022 Chronic systolic (congestive) heart failure: Secondary | ICD-10-CM

## 2017-12-24 ENCOUNTER — Ambulatory Visit (HOSPITAL_COMMUNITY)
Admission: RE | Admit: 2017-12-24 | Discharge: 2017-12-24 | Disposition: A | Discharge status: Home / Self Care | Payer: Medicare Other | Source: Ambulatory Visit | Attending: Cardiology | Admitting: Cardiology

## 2017-12-24 DIAGNOSIS — I5022 Chronic systolic (congestive) heart failure: Secondary | ICD-10-CM | POA: Insufficient documentation

## 2017-12-24 LAB — BASIC METABOLIC PANEL
Anion gap: 11 (ref 5–15)
BUN: 19 mg/dL (ref 6–20)
CO2: 30 mmol/L (ref 22–32)
CREATININE: 0.96 mg/dL (ref 0.61–1.24)
Calcium: 8.4 mg/dL — ABNORMAL LOW (ref 8.9–10.3)
Chloride: 99 mmol/L — ABNORMAL LOW (ref 101–111)
GFR calc non Af Amer: >60 mL/min (ref >60)
Glucose, Bld: 186 mg/dL — ABNORMAL HIGH (ref 65–99)
Potassium: 3.7 mmol/L (ref 3.5–5.1)
SODIUM: 140 mmol/L (ref 135–145)

## 2017-12-28 ENCOUNTER — Ambulatory Visit (HOSPITAL_COMMUNITY)
Admission: RE | Admit: 2017-12-28 | Discharge: 2017-12-28 | Disposition: A | Discharge status: Home / Self Care | Payer: Medicare Other | Source: Ambulatory Visit | Attending: Student | Admitting: Student

## 2017-12-28 ENCOUNTER — Ambulatory Visit (INDEPENDENT_AMBULATORY_CARE_PROVIDER_SITE_OTHER): Payer: Medicare Other | Admitting: Internal Medicine

## 2017-12-28 ENCOUNTER — Encounter: Payer: Self-pay | Admitting: Internal Medicine

## 2017-12-28 ENCOUNTER — Ambulatory Visit (INDEPENDENT_AMBULATORY_CARE_PROVIDER_SITE_OTHER)
Admission: RE | Admit: 2017-12-28 | Discharge: 2017-12-28 | Disposition: A | Discharge status: Home / Self Care | Payer: Medicare Other | Source: Ambulatory Visit | Attending: Internal Medicine | Admitting: Internal Medicine

## 2017-12-28 VITALS — BP 138/76 | HR 65 | Ht 72.0 in | Wt 278.0 lb

## 2017-12-28 DIAGNOSIS — J441 Chronic obstructive pulmonary disease with (acute) exacerbation: Secondary | ICD-10-CM | POA: Insufficient documentation

## 2017-12-28 DIAGNOSIS — J9611 Chronic respiratory failure with hypoxia: Secondary | ICD-10-CM | POA: Insufficient documentation

## 2017-12-28 DIAGNOSIS — R0609 Other forms of dyspnea: Secondary | ICD-10-CM

## 2017-12-28 DIAGNOSIS — F17210 Nicotine dependence, cigarettes, uncomplicated: Secondary | ICD-10-CM

## 2017-12-28 DIAGNOSIS — I2510 Atherosclerotic heart disease of native coronary artery without angina pectoris: Secondary | ICD-10-CM | POA: Diagnosis not present

## 2017-12-28 DIAGNOSIS — J449 Chronic obstructive pulmonary disease, unspecified: Secondary | ICD-10-CM

## 2017-12-28 DIAGNOSIS — R05 Cough: Secondary | ICD-10-CM | POA: Diagnosis not present

## 2017-12-28 DIAGNOSIS — R0602 Shortness of breath: Secondary | ICD-10-CM | POA: Diagnosis not present

## 2017-12-28 LAB — PULMONARY FUNCTION TEST
DL/VA % pred: 64 %
DL/VA: 3.08 ml/min/mmHg/L
DLCO UNC % PRED: 23 %
DLCO UNC: 8.65 ml/min/mmHg
FEF 25-75 Post: 1.49 L/sec
FEF 25-75 Pre: 1.15 L/sec
FEF2575-%Change-Post: 29 %
FEF2575-%Pred-Post: 53 %
FEF2575-%Pred-Pre: 41 %
FEV1-%CHANGE-POST: 6 %
FEV1-%PRED-POST: 43 %
FEV1-%PRED-PRE: 40 %
FEV1-POST: 1.41 L
FEV1-Pre: 1.33 L
FEV1FVC-%Change-Post: 0 %
FEV1FVC-%PRED-PRE: 101 %
FEV6-%Change-Post: 6 %
FEV6-%PRED-POST: 44 %
FEV6-%PRED-PRE: 41 %
FEV6-POST: 1.81 L
FEV6-Pre: 1.7 L
FEV6FVC-%PRED-POST: 104 %
FEV6FVC-%Pred-Pre: 104 %
FVC-%Change-Post: 7 %
FVC-%PRED-PRE: 39 %
FVC-%Pred-Post: 42 %
FVC-POST: 1.82 L
FVC-PRE: 1.7 L
POST FEV6/FVC RATIO: 100 %
PRE FEV1/FVC RATIO: 78 %
PRE FEV6/FVC RATIO: 100 %
Post FEV1/FVC ratio: 77 %
RV % PRED: 67 %
RV: 1.76 L
TLC % PRED: 46 %
TLC: 3.58 L

## 2017-12-28 MED ORDER — ALBUTEROL SULFATE (2.5 MG/3ML) 0.083% IN NEBU
2.5000 mg | INHALATION_SOLUTION | Freq: Once | RESPIRATORY_TRACT | Status: AC
  Administered 2017-12-28: 2.5 mg via RESPIRATORY_TRACT

## 2017-12-28 NOTE — Progress Notes (Signed)
Subjective:     Patient ID: Nicholas Caldwell, male   DOB: Apr 06, 1946,    MRN: [id number]  HPI  64 yobm  Active smoker with baseline wt  260 around 2016  and able to walk slow pace = MMRC2 = could not  walk a nl pace on a flat grade s sob but did fine slow and flat eg [employer] / carried groceries to/ from care  then slowed down by  R Hip and low back pain and gained about 20 lb with progressive doe  and referred to pulmonary clinic 12/28/2017 by Dr   Chalmers Cater for surgical clearance for R THR with spirometry restrictive 12/28/2017 while maint on symb 160  for presumed AB related to smoking   12/28/2017 1st [facility] office visit/ Nicholas Caldwell   Chief Complaint  Patient presents with  . Pulmonary Consult    Referred by Dr. Barrington Ellison. Pt c/o SOB for the past year. He states he gets SOB just walking a few steps.   on 3lpm x 24/7 per day per cards x 1.5 years now sob x across the room no better on symb though hfa poor  Able to lie down hs on 2 pillows with minimal am congestion chronically  Feels has had a cold x last sev months > thick white mucus   No obvious day to day or daytime variability or assoc purulent sputum or mucus plugs or hemoptysis or cp or chest tightness, subjective wheeze or overt sinus or hb symptoms. No unusual exposure hx or h/o childhood pna/ asthma or knowledge of premature birth.  Sleeping ok 2 pillows without nocturnal  or early am exacerbation  of respiratory  c/o's or need for noct saba. Also denies any obvious fluctuation of symptoms with weather or environmental changes or other aggravating or alleviating factors except as outlined above   Current Allergies, Complete Past Medical History, Past Surgical History, Family History, and Social History were reviewed in [employer] record.  ROS  The following are not active complaints unless bolded Hoarseness, sore throat, dysphagia, dental problems, itching, sneezing,  nasal congestion or discharge of  excess mucus or purulent secretions, ear ache,   fever, chills, sweats, unintended wt loss or wt gain, classically pleuritic or exertional cp,  orthopnea pnd or leg swelling, presyncope, palpitations, abdominal pain, anorexia, nausea, vomiting, diarrhea  or change in bowel habits or change in bladder habits, change in stools or change in urine, dysuria, hematuria,  rash, arthralgias, visual complaints, headache, numbness, weakness or ataxia or problems with walking or coordination,  change in mood/affect or memory.        Current Meds  Medication Sig  . albuterol (PROVENTIL HFA;VENTOLIN HFA) 108 (90 BASE) MCG/ACT inhaler Inhale 2 puffs into the lungs every 6 (six) hours as needed for wheezing or shortness of breath.  . budesonide-formoterol (SYMBICORT) 160-4.5 MCG/ACT inhaler Inhale 2 puffs into the lungs 2 (two) times daily.  . carvedilol (COREG) 12.5 MG tablet Take 1 tablet (12.5 mg total) by mouth 2 (two) times daily with a meal.  . diazepam (VALIUM) 10 MG tablet Take 0.5 tablets (5 mg total) by mouth daily as needed for anxiety or sleep.  Marland Kitchen diclofenac sodium (VOLTAREN) 1 % GEL Apply 2 g topically 2 (two) times daily as needed (for pain).  . fluticasone (FLONASE) 50 MCG/ACT nasal spray Place 2 sprays into both nostrils daily as needed for allergies.   Marland Kitchen gabapentin (NEURONTIN) 600 MG tablet Take 1 tablet (600 mg total) by  mouth 2 (two) times daily.  Marland Kitchen glucose 4 GM chewable tablet Chew 4 tablets by mouth See admin instructions. Chew 4 tablets by mouth as needed for low blood sugar - repeat every 15 minutes if blood sugar less than 70  . hydrocortisone (ANUSOL-HC) 2.5 % rectal cream Place 1 application rectally 2 (two) times daily as needed for hemorrhoids or itching. Use up to 2 weeks at a time as needed  . hydrocortisone 1 % lotion Apply 1 application topically See admin instructions. Apply small amount to back twice daily for itchy rash  . insulin aspart (NOVOLOG) 100 UNIT/ML injection Inject 30  Units into the skin 2 (two) times daily.  Marland Kitchen ipratropium (ATROVENT) 0.02 % nebulizer solution Take 2.5 mLs (0.5 mg total) by nebulization every 6 (six) hours as needed for wheezing or shortness of breath.  . magnesium oxide (MAG-OX) 400 (241.3 Mg) MG tablet Take 1 tablet (400 mg total) by mouth 2 (two) times daily.  . metFORMIN (GLUCOPHAGE-XR) 500 MG 24 hr tablet Take 1,000 mg by mouth 2 (two) times daily with a meal.  . metolazone (ZAROXOLYN) 2.5 MG tablet Take 1 tablet (2.5 mg total) by mouth as directed.  . OXYGEN 3lpm 24/7  . potassium chloride SA (K-DUR,KLOR-CON) 20 MEQ tablet Take 2 tablets (40 mEq total) by mouth daily.  . rivaroxaban (XARELTO) 20 MG TABS tablet Take 1 tablet (20 mg total) by mouth daily with supper.  . rosuvastatin (CRESTOR) 40 MG tablet Take 0.5 tablets (20 mg total) by mouth daily. For cholesterol  . spironolactone (ALDACTONE) 25 MG tablet Take 0.5 tablets (12.5 mg total) by mouth at bedtime.  . torsemide (DEMADEX) 20 MG tablet Take 2 tablets (40 mg total) by mouth 2 (two) times daily.          Review of Systems     Objective:   Physical Exam  Very slow moving (due to R hip pain)  obese bm nad   Wt Readings from Last 3 Encounters:  12/28/17 278 lb (126.1 kg)  12/08/17 286 lb 6.4 oz (129.9 kg)  10/22/17 268 lb (121.6 kg)     Vital signs reviewed - Note on arrival 02 sats  99% on 2lpm continuous       HEENT: nl  turbinates bilaterally, and oropharynx. Nl external ear canals without cough reflex- edentulous    NECK :  without JVD/Nodes/TM/ nl carotid upstrokes bilaterally   LUNGS: no acc muscle use,  Nl contour chest with minimal insp / exp rhonchi bilaterally and distant bs   CV:  RRR  no s3 or murmur or increase in P2, and no edema   ABD:  Tensely  obese with limited inspiratory excursion in the supine position. No bruits or organomegaly appreciated, bowel sounds nl  MS:   Very slow gait with ext warm without deformities, calf tenderness,  cyanosis or clubbing No obvious joint restrictions   SKIN: warm and dry without lesions    NEURO:  alert, approp, nl sensorium with  no motor or cerebellar deficits apparent.      CXR PA and Lateral:   12/28/2017 :    I personally reviewed images my impression  follows:    Marked CM with bibasilar dep atx       Assessment:

## 2017-12-28 NOTE — Assessment & Plan Note (Signed)
12/28/2017  After extensive coaching inhaler device  effectiveness =    75% from a baseline of 50%

## 2017-12-28 NOTE — Assessment & Plan Note (Signed)
>   3 min discussion I reviewed the Fletcher curve with the patient that basically indicates  if you quit smoking when your best day FEV1 is still well preserved (as is clearly  the case here)  it is highly unlikely you will progress to severe disease and informed the patient there was  no medication on the market that has proven to alter the curve/ its downward trajectory  or the likelihood of progression of their disease(unlike other chronic medical conditions such as atheroclerosis where we do think we can change the natural hx with risk reducing meds)    Therefore stopping smoking and maintaining abstinence are  the most important aspects of care, not choice of inhalers or for that matter, doctors.   Treatment other than smoking cessation  is entirely directed by severity of symptoms and focused also on reducing exacerbations, not attempting to change the natural history of the disease.    For now rx with symbicort 160 adequate for the ab component and needs to quit smoking x 2 weeks preop min to reduce periop complications listed under doe

## 2017-12-28 NOTE — Patient Instructions (Addendum)
Plan A = Automatic = Symbicort 160 Take 2 puffs first thing in am and then another 2 puffs about 12 hours later.   Work on inhaler technique:  relax and gently blow all the way out then take a nice smooth deep breath back in, triggering the inhaler at same time you start breathing in.  Hold for up to 5 seconds if you can. Blow out thru nose. Rinse and gargle with water when done      Plan B = Backup Only use your albuterol (proair) as a rescue medication to be used if you can't catch your breath by resting or doing a relaxed purse lip breathing pattern.  - The less you use it, the better it will work when you need it. - Ok to use the inhaler up to 2 puffs  every 4 hours if you must but call for appointment if use goes up over your usual need - Don't leave home without it !!  (think of it like the spare tire for your car)   Plan C = Crisis - only use your albuterol nebulizer if you first try Plan B and it fails to help > ok to use the nebulizer up to every 4 hours but if start needing it regularly call for immediate appointment   The key is to stop smoking completely before smoking completely stops you!   For cough > mucinex dm 1200 mg every 12 hours as needed     Please remember to go to the  x-ray department downstairs in the basement  for your tests - we will call you with the results when they are available.      Pulmonary follow up is as needed

## 2017-12-28 NOTE — Assessment & Plan Note (Addendum)
Echo 12/13/16 Left ventricle: The cavity size was normal. Wall thickness was   increased in a pattern of mild LVH. Systolic function was mildly   to moderately reduced. The estimated ejection fraction was in the   range of 40% to 45%. Diffuse hypokinesis. There is hypokinesis of   the basal-midinferior and inferoseptal myocardium. Features are   consistent with a pseudonormal left ventricular filling pattern,   with concomitant abnormal relaxation and increased filling   pressure (grade 2 diastolic dysfunction). - Left atrium: The atrium was moderately dilated. - Right ventricle: The cavity size was mildly dilated. Wall   thickness was normal. - Right atrium: The atrium was moderately dilated. - Pulmonary arteries: Systolic pressure was moderately increased.   PA peak pressure: 56 mm Hg (S). PFT's  12/28/2017  FEV1 1.41 (43 % ) ratio 77  p no % improvement from saba p symb 160 prior to study with DLCO  23 % corrects to 64  % for alv volume    So dyspnea multifactorial but likely related to obesity / chf >>> primary  lung dz   He predominantly has restrictive pft changes chronically but with active smoking probably does have element of AB that would improve with consistent and effective use of hfa and smoking cessation (see separate a/p)     He is cleared for hip surgery with increased risk of periop resp failure related to atx/ mucus plugs from mucociliary dysfunction related to smoking  so needs to have bipap availability until mobile with minimal narcotic exp/ max mobilization/ periop duoneb  and no smoking between now and THR.      Pulmonary f/u can be prn since he has such little evidence of limiting airways dysfunction.   Total time devoted to counseling  > 50 % of initial 60 min office visit:  review case with pt/ discussion of options/alternatives/ personally creating written customized instructions  in presence of pt  then going over those specific  Instructions directly with the  pt including how to use all of the meds but in particular covering each new medication in detail and the difference between the maintenance= "automatic" meds and the prns using an action plan format for the latter (If this problem/symptom => do that organization reading Left to right).  Please see AVS from this visit for a full list of these instructions which I personally wrote for this pt and  are unique to this visit.

## 2017-12-28 NOTE — Assessment & Plan Note (Signed)
On 2lpm today sats high 90's and still c/o air hunger with minimal activity > no change 02 for now but cautioned re 02 rx and smoking dangers

## 2017-12-29 ENCOUNTER — Telehealth: Payer: Self-pay | Admitting: Internal Medicine

## 2017-12-29 NOTE — Telephone Encounter (Signed)
Will route this to [facility] for her to keep an eye out.

## 2017-12-29 NOTE — Progress Notes (Signed)
LMTCB

## 2017-12-29 NOTE — Telephone Encounter (Signed)
Pt returned call and I gave him the results of the cxr.  Pt expressed understanding. Nothing further needed at this current time.

## 2017-12-29 NOTE — Telephone Encounter (Signed)
Attempted to call pt but no answer and no machine kicked in to leave a message.  Will try to call back later.

## 2017-12-30 IMAGING — CR DG CHEST 2V
2 series · 2 of 2 positions shown · non-contrast
Comparison: Chest radiograph performed 06/29/2016

CLINICAL DATA: Acute onset of shortness of breath and productive
cough. Mild wheezing. Initial encounter.

EXAM:
CHEST  2 VIEW

[w chest lat]
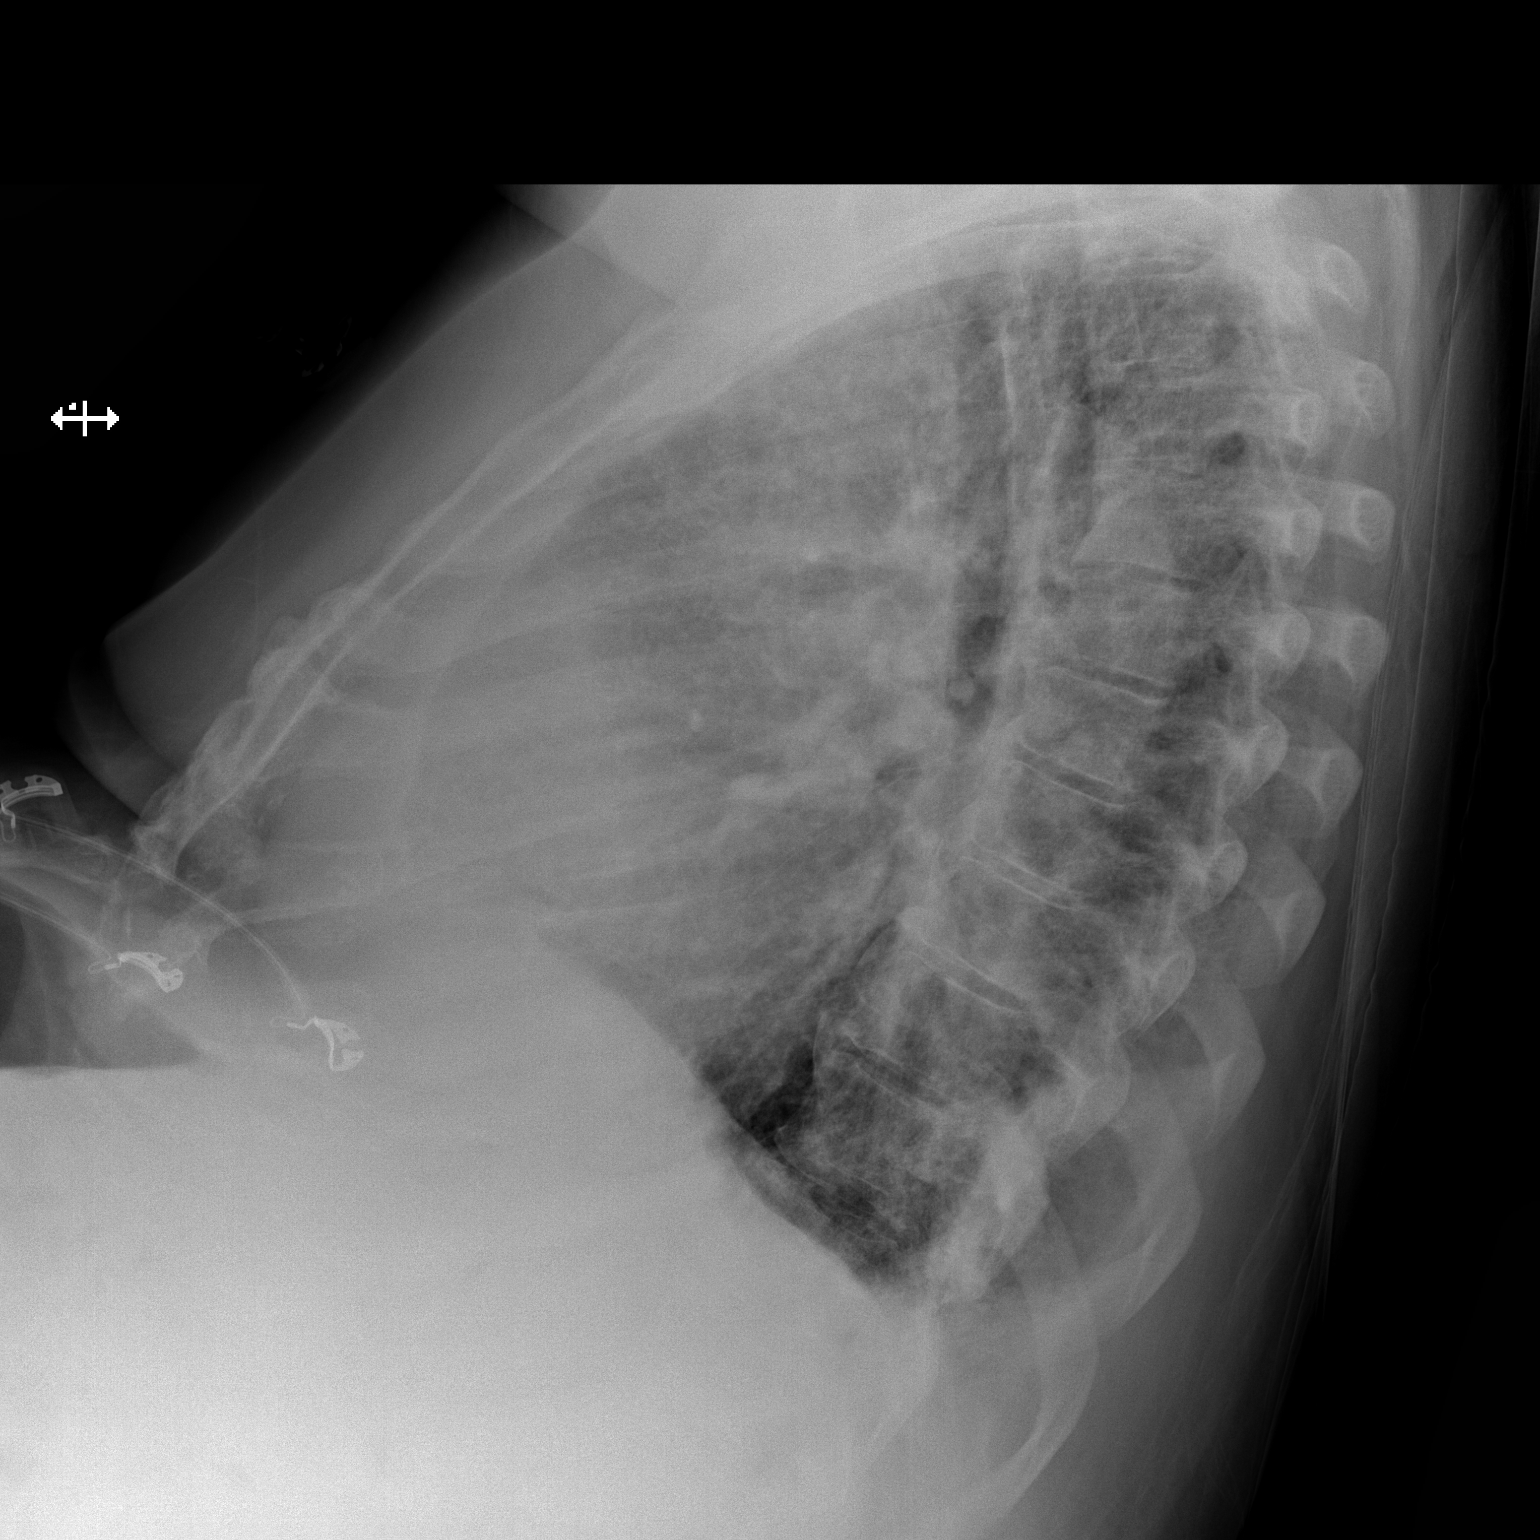

[x chest ap]
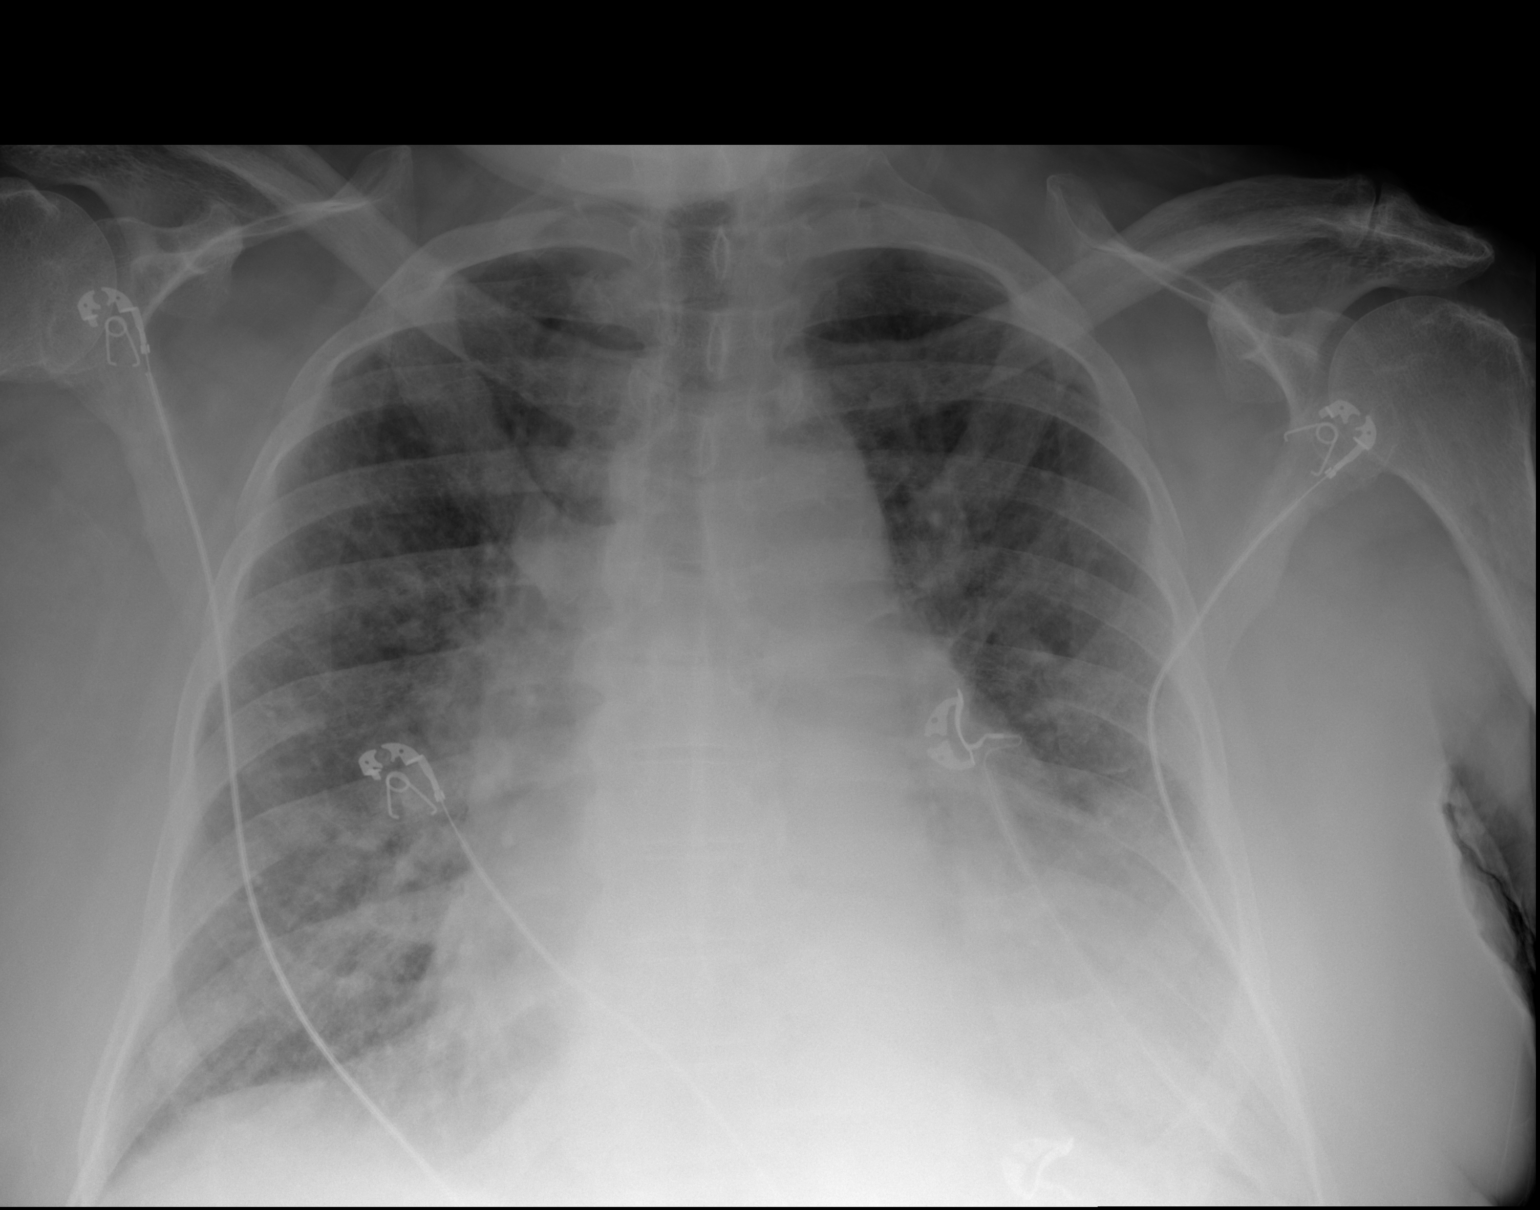

[2 of 2 positions shown; findings below may reference images not displayed]

FINDINGS: Vascular congestion is noted. Increased interstitial markings raise
concern for pulmonary edema. No definite pleural effusion or
pneumothorax is seen.

The heart is enlarged. No acute osseous abnormalities are
identified.
IMPRESSION: Vascular congestion and cardiomegaly. Increased interstitial
markings raise concern for pulmonary edema.

## 2017-12-30 NOTE — Telephone Encounter (Signed)
Form signed by Dr Melvyn Novas  There is still a place for the pt to sign  I spoke with spouse and notified her of this  She verbalized understanding and wants to pick up forms  I placed them up front and nothing further needed

## 2017-12-30 NOTE — Telephone Encounter (Signed)
Form has been placed in MW's look at.

## 2018-01-05 ENCOUNTER — Encounter (HOSPITAL_COMMUNITY): Payer: Medicare Other

## 2018-01-14 ENCOUNTER — Encounter (HOSPITAL_COMMUNITY): Payer: Medicare Other

## 2018-01-28 ENCOUNTER — Encounter (HOSPITAL_COMMUNITY): Payer: Medicare Other

## 2018-02-04 ENCOUNTER — Encounter (HOSPITAL_COMMUNITY): Payer: Self-pay

## 2018-02-04 ENCOUNTER — Ambulatory Visit (HOSPITAL_COMMUNITY)
Admission: RE | Admit: 2018-02-04 | Discharge: 2018-02-04 | Disposition: A | Discharge status: Home / Self Care | Payer: Medicare Other | Source: Ambulatory Visit | Attending: Internal Medicine | Admitting: Internal Medicine

## 2018-02-04 VITALS — BP 128/62 | HR 62

## 2018-02-04 DIAGNOSIS — G4733 Obstructive sleep apnea (adult) (pediatric): Secondary | ICD-10-CM | POA: Insufficient documentation

## 2018-02-04 DIAGNOSIS — I2510 Atherosclerotic heart disease of native coronary artery without angina pectoris: Secondary | ICD-10-CM

## 2018-02-04 DIAGNOSIS — I110 Hypertensive heart disease with heart failure: Secondary | ICD-10-CM | POA: Diagnosis not present

## 2018-02-04 DIAGNOSIS — Z794 Long term (current) use of insulin: Secondary | ICD-10-CM | POA: Insufficient documentation

## 2018-02-04 DIAGNOSIS — Z79899 Other long term (current) drug therapy: Secondary | ICD-10-CM | POA: Insufficient documentation

## 2018-02-04 DIAGNOSIS — J9611 Chronic respiratory failure with hypoxia: Secondary | ICD-10-CM | POA: Insufficient documentation

## 2018-02-04 DIAGNOSIS — Z7901 Long term (current) use of anticoagulants: Secondary | ICD-10-CM | POA: Insufficient documentation

## 2018-02-04 DIAGNOSIS — F17200 Nicotine dependence, unspecified, uncomplicated: Secondary | ICD-10-CM

## 2018-02-04 DIAGNOSIS — I429 Cardiomyopathy, unspecified: Secondary | ICD-10-CM | POA: Diagnosis not present

## 2018-02-04 DIAGNOSIS — I5022 Chronic systolic (congestive) heart failure: Secondary | ICD-10-CM | POA: Insufficient documentation

## 2018-02-04 DIAGNOSIS — Z9981 Dependence on supplemental oxygen: Secondary | ICD-10-CM | POA: Diagnosis not present

## 2018-02-04 DIAGNOSIS — I10 Essential (primary) hypertension: Secondary | ICD-10-CM | POA: Diagnosis not present

## 2018-02-04 DIAGNOSIS — E119 Type 2 diabetes mellitus without complications: Secondary | ICD-10-CM | POA: Insufficient documentation

## 2018-02-04 DIAGNOSIS — J449 Chronic obstructive pulmonary disease, unspecified: Secondary | ICD-10-CM | POA: Insufficient documentation

## 2018-02-04 DIAGNOSIS — I480 Paroxysmal atrial fibrillation: Secondary | ICD-10-CM | POA: Diagnosis not present

## 2018-02-04 LAB — BASIC METABOLIC PANEL
ANION GAP: 11 (ref 5–15)
BUN: 14 mg/dL (ref 6–20)
CHLORIDE: 97 mmol/L — AB (ref 101–111)
CO2: 30 mmol/L (ref 22–32)
Calcium: 8.4 mg/dL — ABNORMAL LOW (ref 8.9–10.3)
Creatinine, Ser: 0.99 mg/dL (ref 0.61–1.24)
GFR calc Af Amer: >60 mL/min (ref >60)
GFR calc non Af Amer: >60 mL/min (ref >60)
GLUCOSE: 239 mg/dL — AB (ref 65–99)
Potassium: 4.3 mmol/L (ref 3.5–5.1)
Sodium: 138 mmol/L (ref 135–145)

## 2018-02-04 MED ORDER — SPIRONOLACTONE 25 MG PO TABS: 25.0000 mg | ORAL_TABLET | Freq: Every day | ORAL | 11 refills | Status: DC

## 2018-02-04 NOTE — Patient Instructions (Signed)
Routine lab work today. Will notify you of abnormal results, otherwise no news is good news!  INCREASE Spironolactone to 25 mg (1 whole tablet) once daily.  Will schedule you for an echocardiogram at [facility]. Same check-in you did today.  Follow up 3 months with [name].  Take all medication as prescribed the day of your appointment. Bring all medications with you to your appointment.  Do the following things EVERYDAY: 1) Weigh yourself in the morning before breakfast. Write it down and keep it in a log. 2) Take your medicines as prescribed 3) Eat low salt foods-Limit salt (sodium) to 2000 mg per day.  4) Stay as active as you can everyday 5) Limit all fluids for the day to less than 2 liters
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
## 2018-02-08 IMAGING — CR DG CHEST 2V
2 series · 2 of 2 positions shown · non-contrast
Comparison: 07/27/2016

CLINICAL DATA: CHF, atrial flutter, coronary disease, COPD, non
ischemic cardiomyopathy, diabetes mellitus, hypertension, paroxysmal
atrial fibrillation, smoker

EXAM:
CHEST  2 VIEW

[w chest pa]
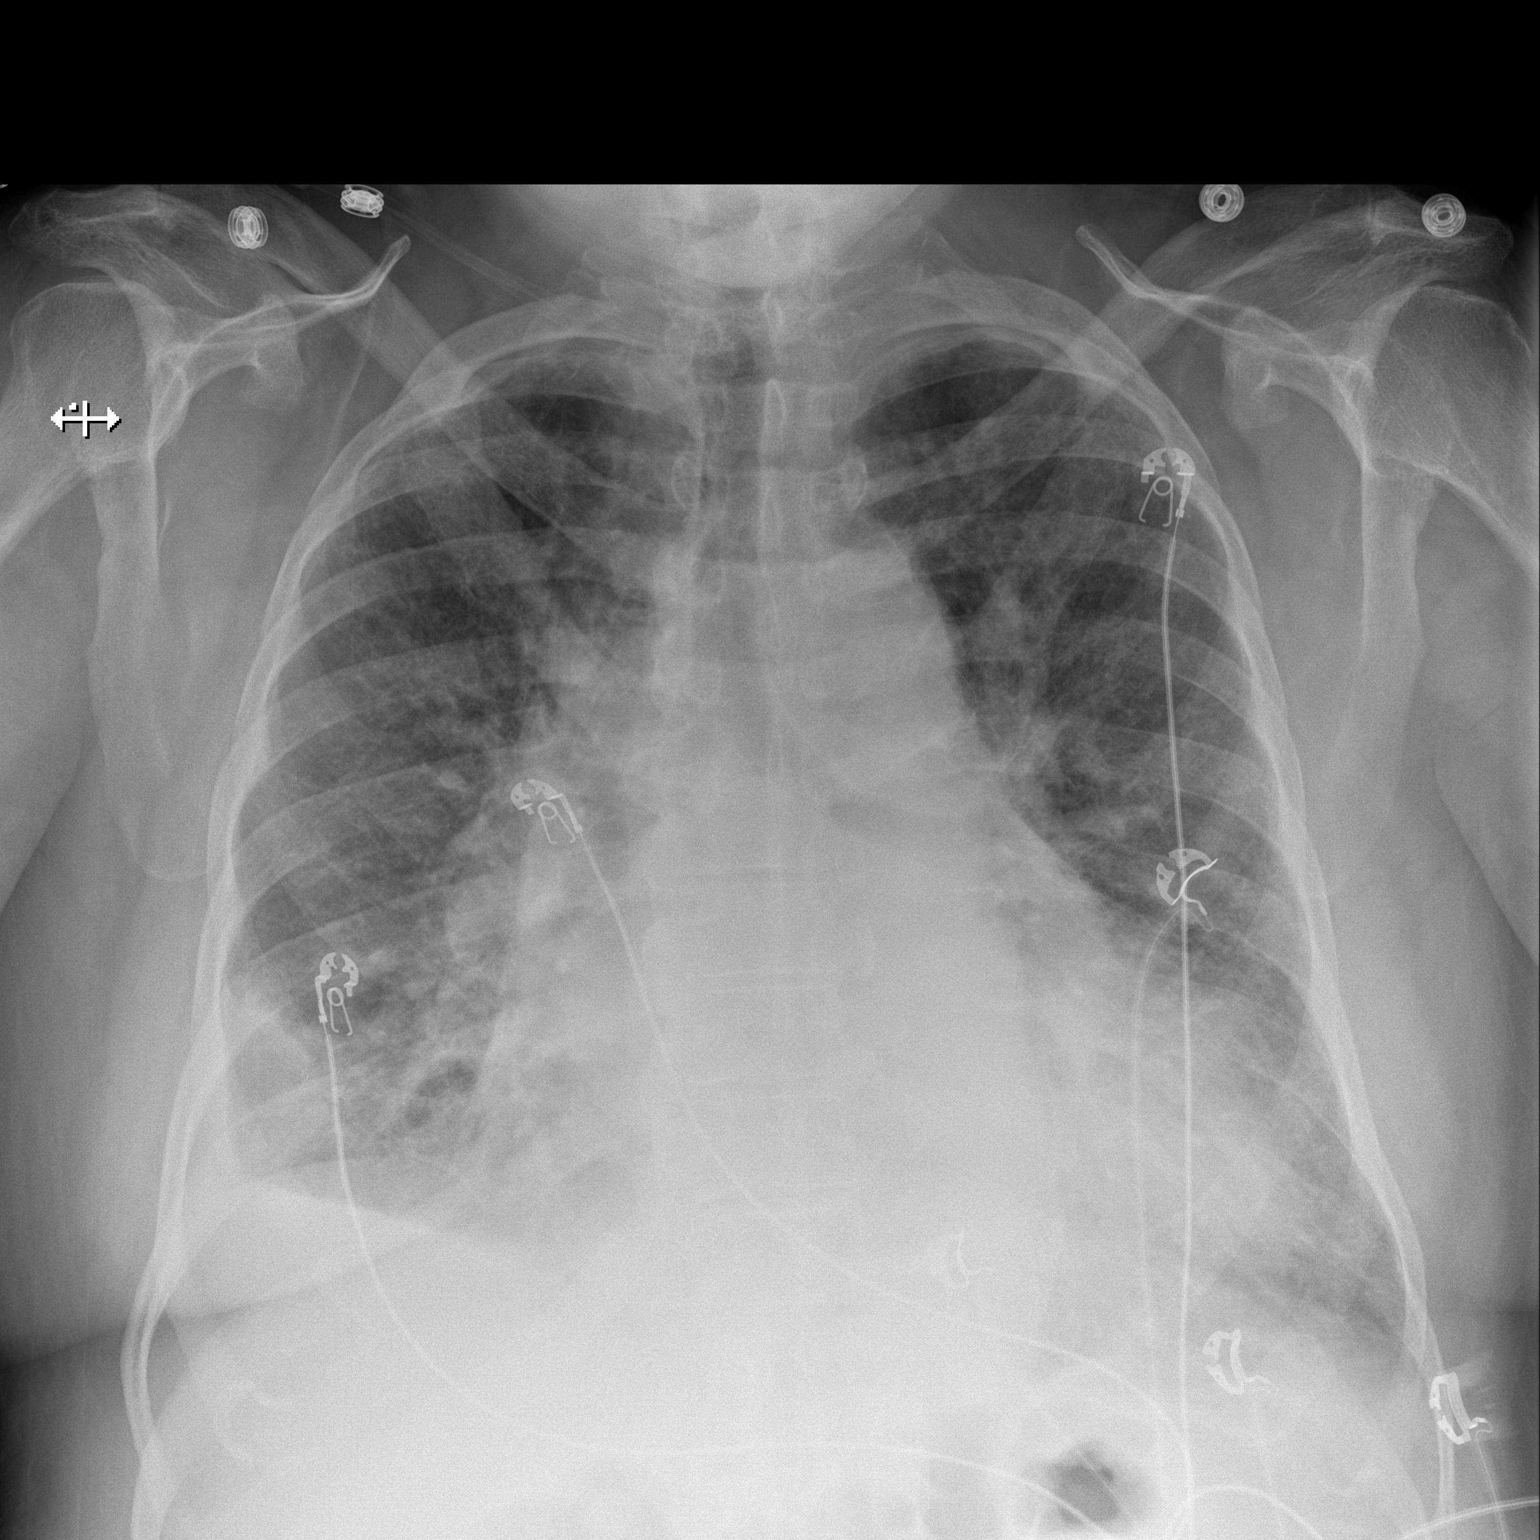

[w chest lat]
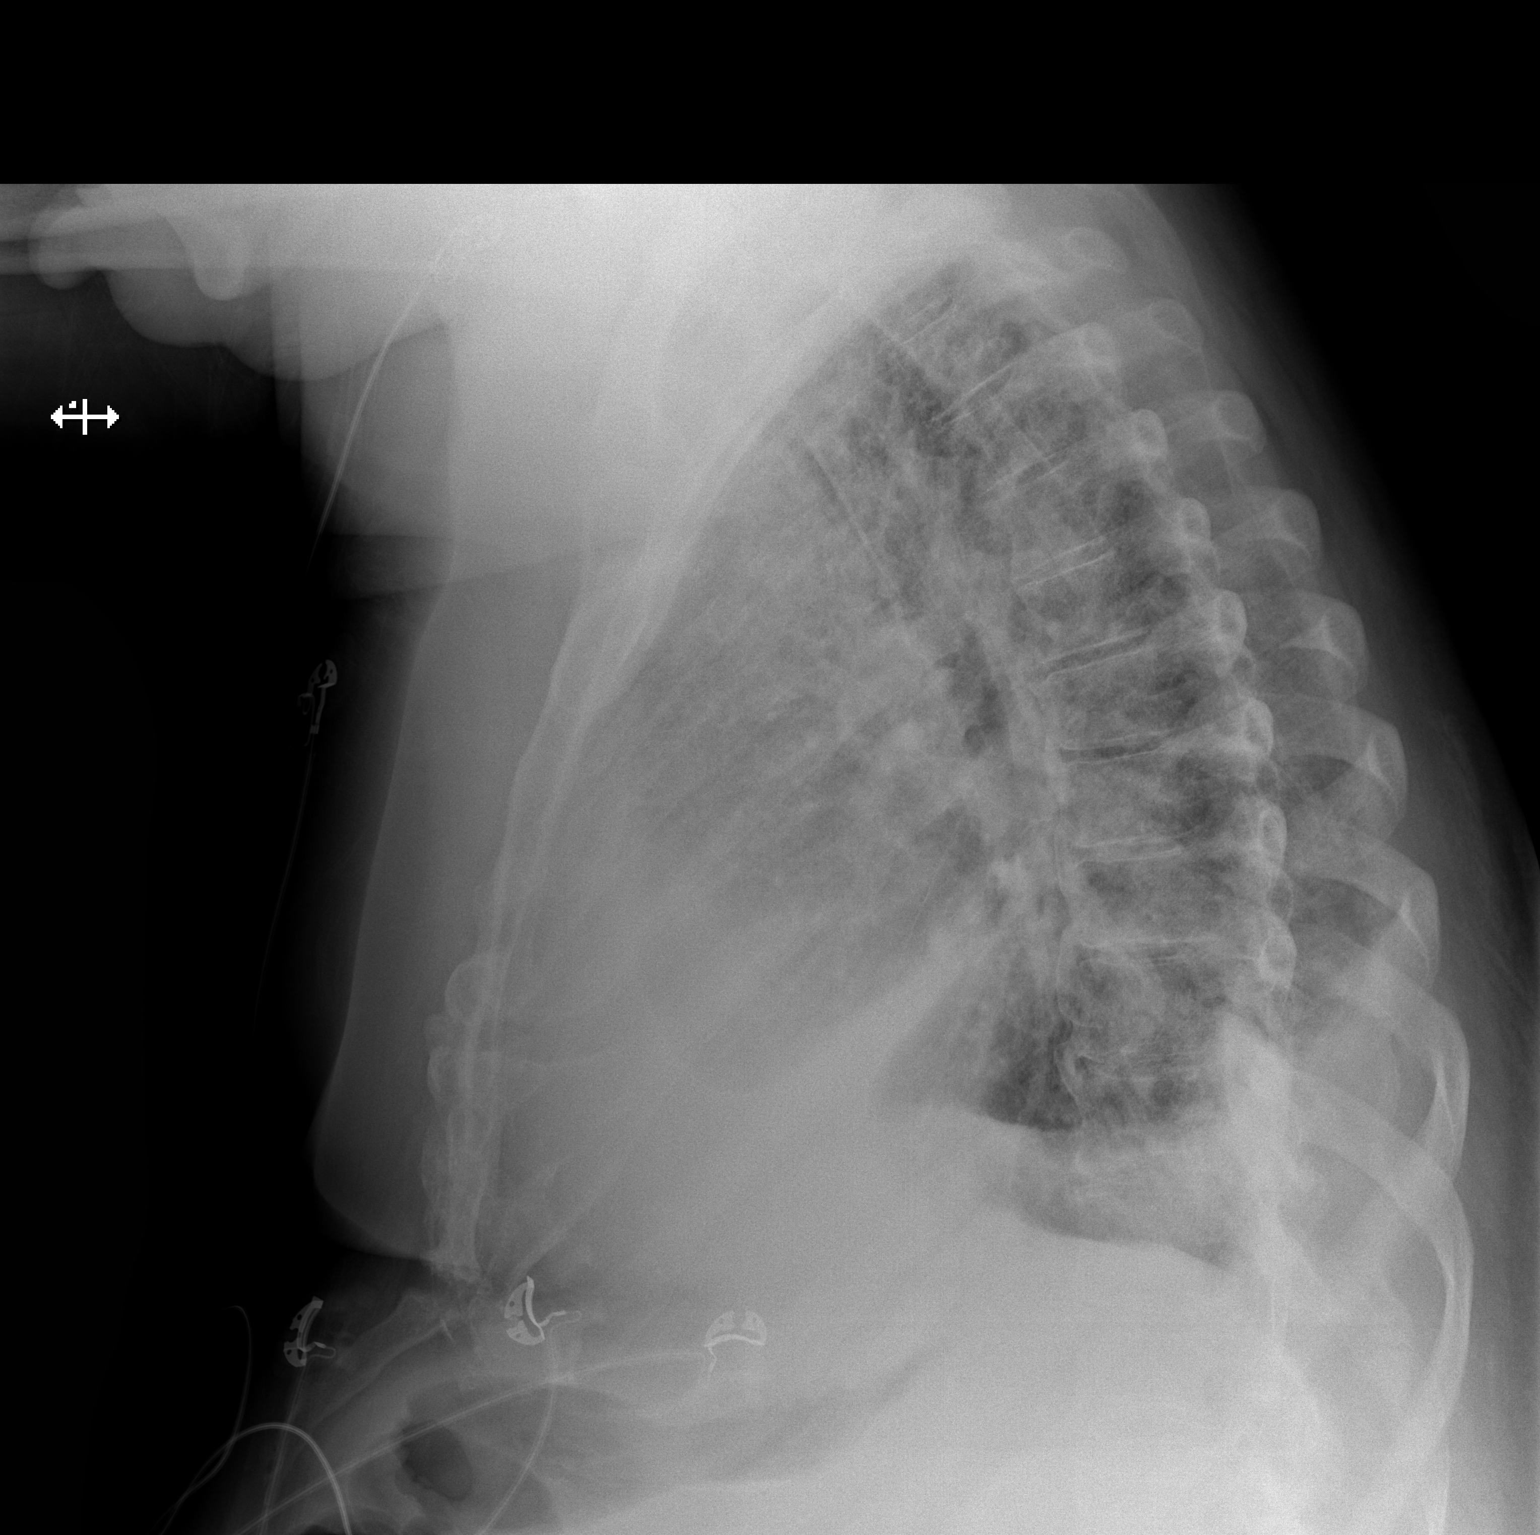

[2 of 2 positions shown; findings below may reference images not displayed]

FINDINGS: Enlargement of cardiac silhouette with pulmonary vascular
congestion.

Interstitial infiltrates likely interstitial pulmonary edema and
CHF.

RIGHT basilar pleural effusion atelectasis.

No pneumothorax.

Bones unremarkable.
IMPRESSION: CHF with RIGHT pleural effusion.

## 2018-02-09 IMAGING — DX DG ABD PORTABLE 1V
1 series · 1 of 1 positions shown · non-contrast
Comparison: Itch chest radiograph of earlier today.

CLINICAL DATA: NG tube placement.

EXAM:
PORTABLE ABDOMEN - 1 VIEW

[abdomen kub]
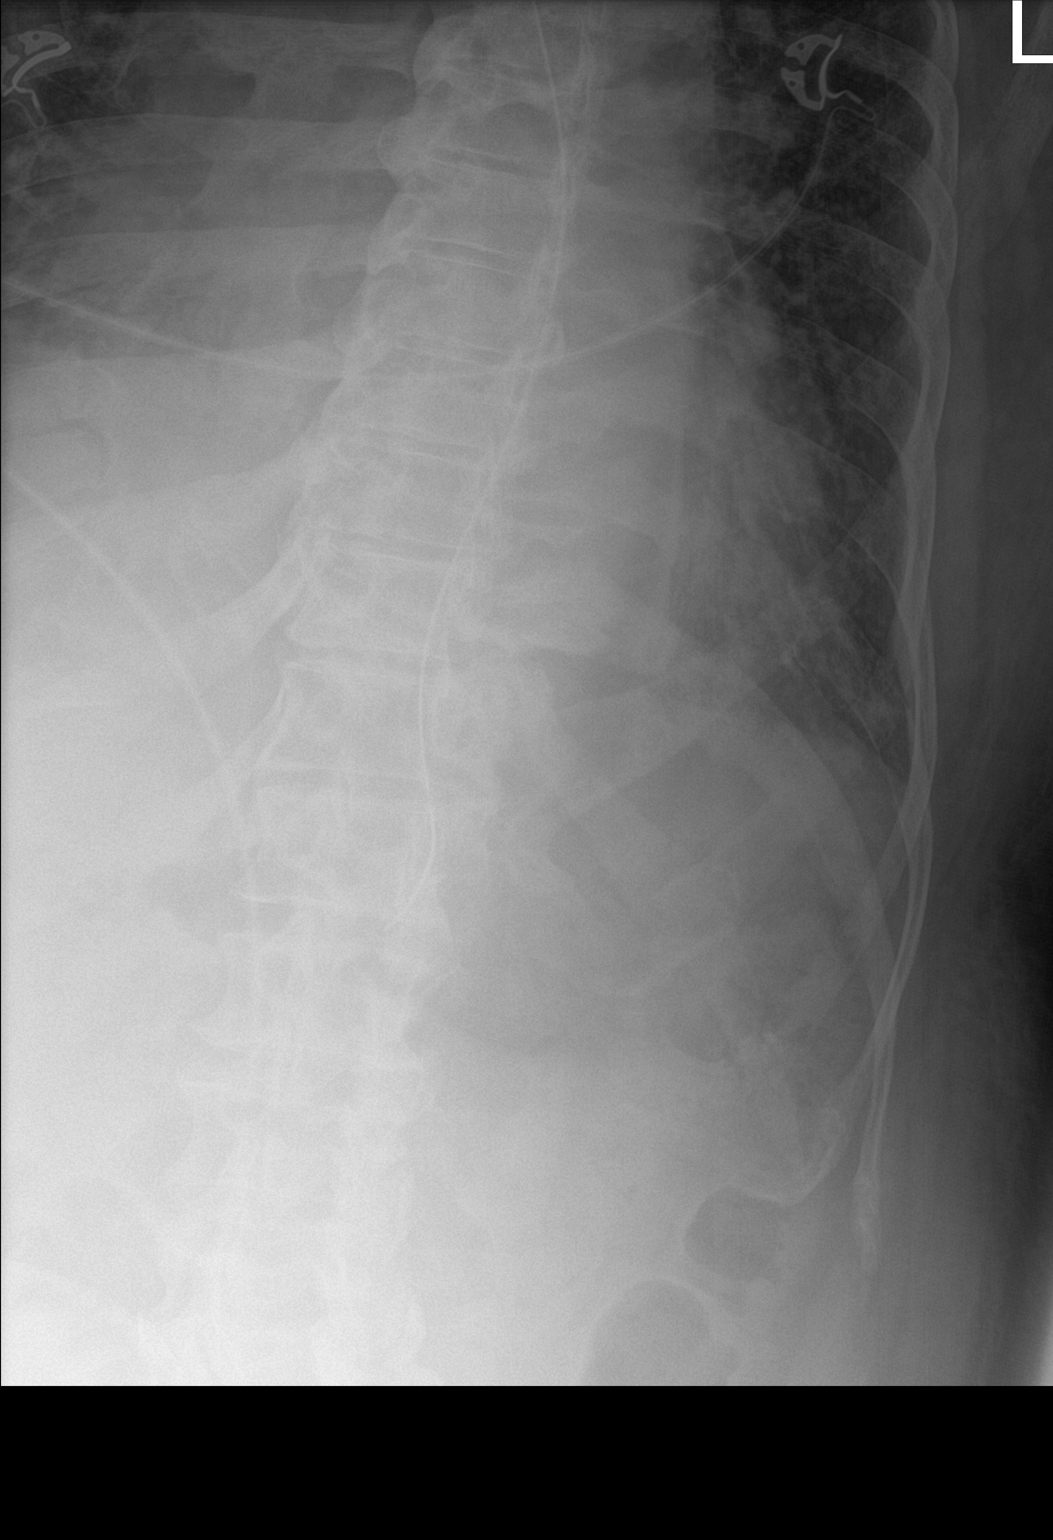

[1 of 1 positions shown; findings below may reference images not displayed]

FINDINGS: Nasogastric terminates at the body of the stomach. Side port is not
well visualized. No gross bowel obstruction

Cardiomegaly.
IMPRESSION: Nasogastric at the body of the stomach. Consider minimal of
enhancement.

## 2018-02-09 IMAGING — DX DG CHEST 1V PORT
1 series · 1 of 1 positions shown · non-contrast
Comparison: Prior radiograph from earlier the same day.

CLINICAL DATA: Initial evaluation for endotracheal tube placement.

EXAM:
PORTABLE CHEST 1 VIEW

[chest ap]
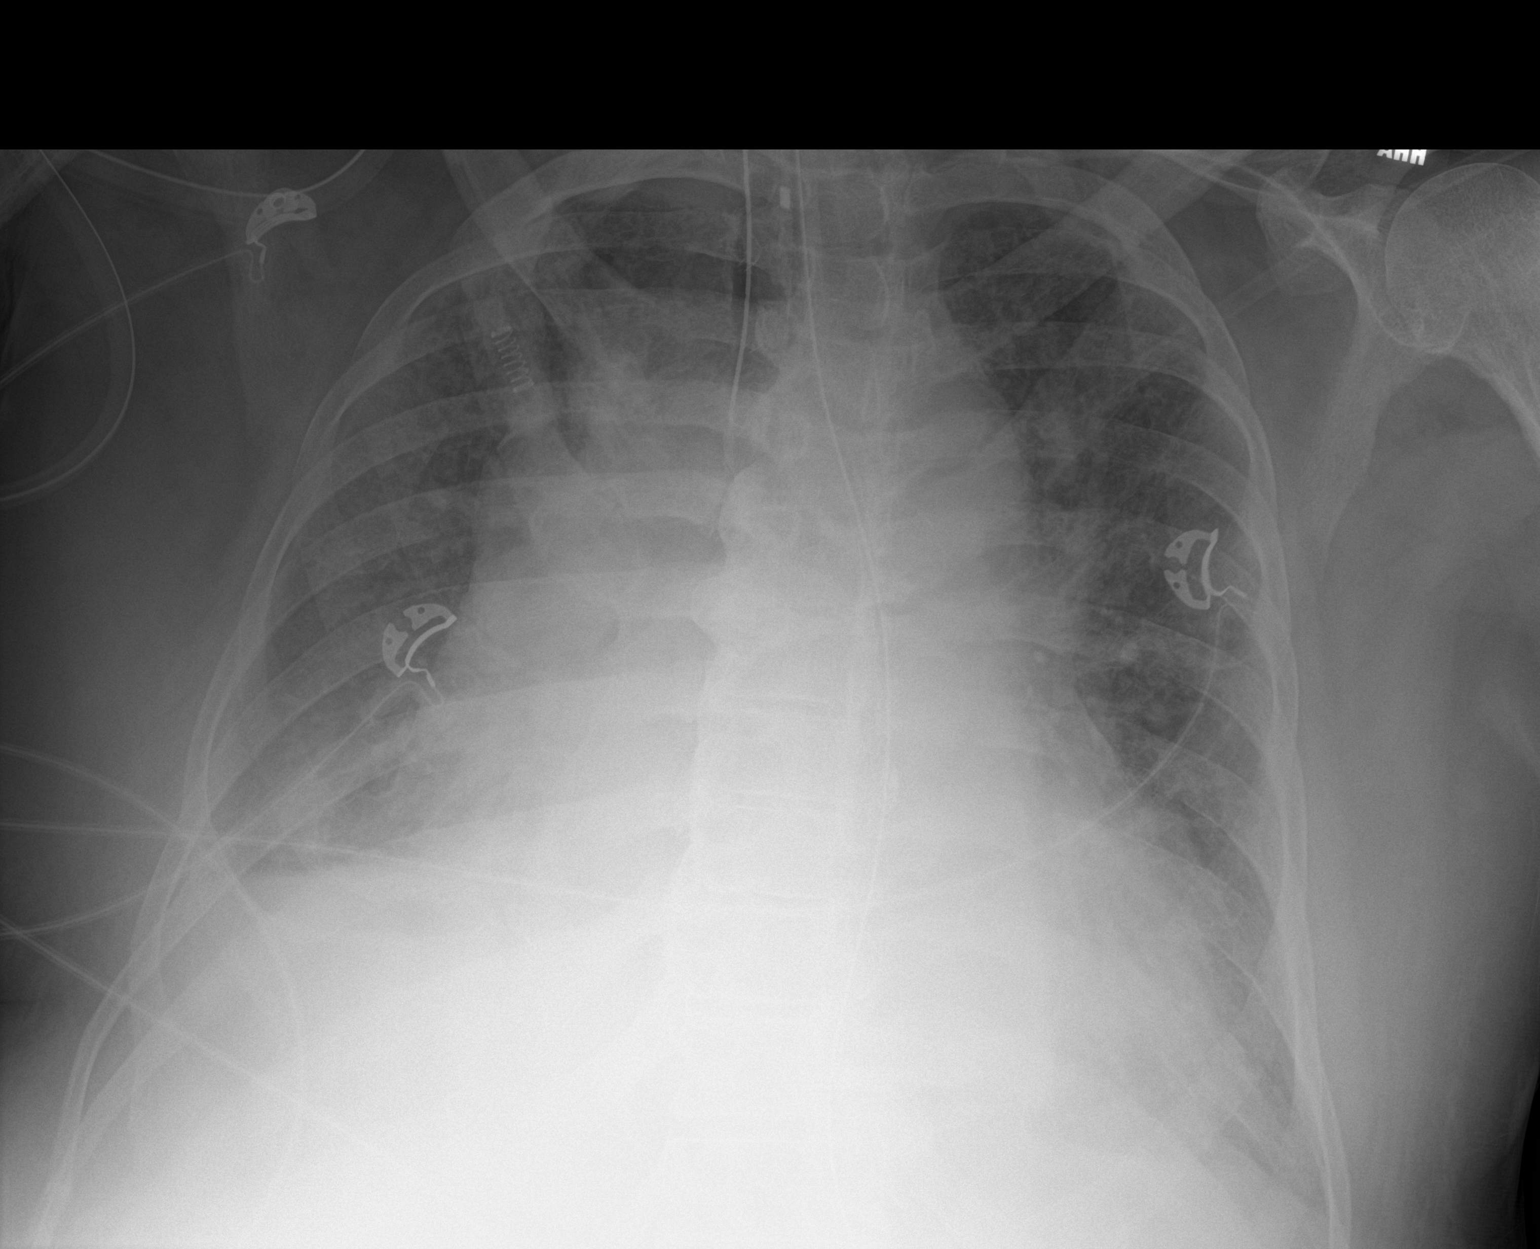

[1 of 1 positions shown; findings below may reference images not displayed]

FINDINGS: Study is limited as the patient is markedly rotated to the right.
Patient is now intubated with an endotracheal tube in place. Tip of
the tube is positioned approximately 2.5 cm above the carina.
Enteric tube courses in the the abdomen.

Cardiomegaly grossly stable. Changes related congestive heart
failure again noted, relatively stable. No pneumothorax.

Osseous structures unchanged.
IMPRESSION: 1. Tip of the endotracheal tube approximately 2.5 cm above the
carina.
2. Little interval change in appearance of the chest with
cardiomegaly and findings consistent with congestive heart failure.

## 2018-02-09 IMAGING — DX DG CHEST 1V PORT
1 series · 1 of 1 positions shown · non-contrast
Comparison: Chest radiograph from one day prior.

CLINICAL DATA: Compromised respiration

EXAM:
PORTABLE CHEST 1 VIEW

[chest ap]
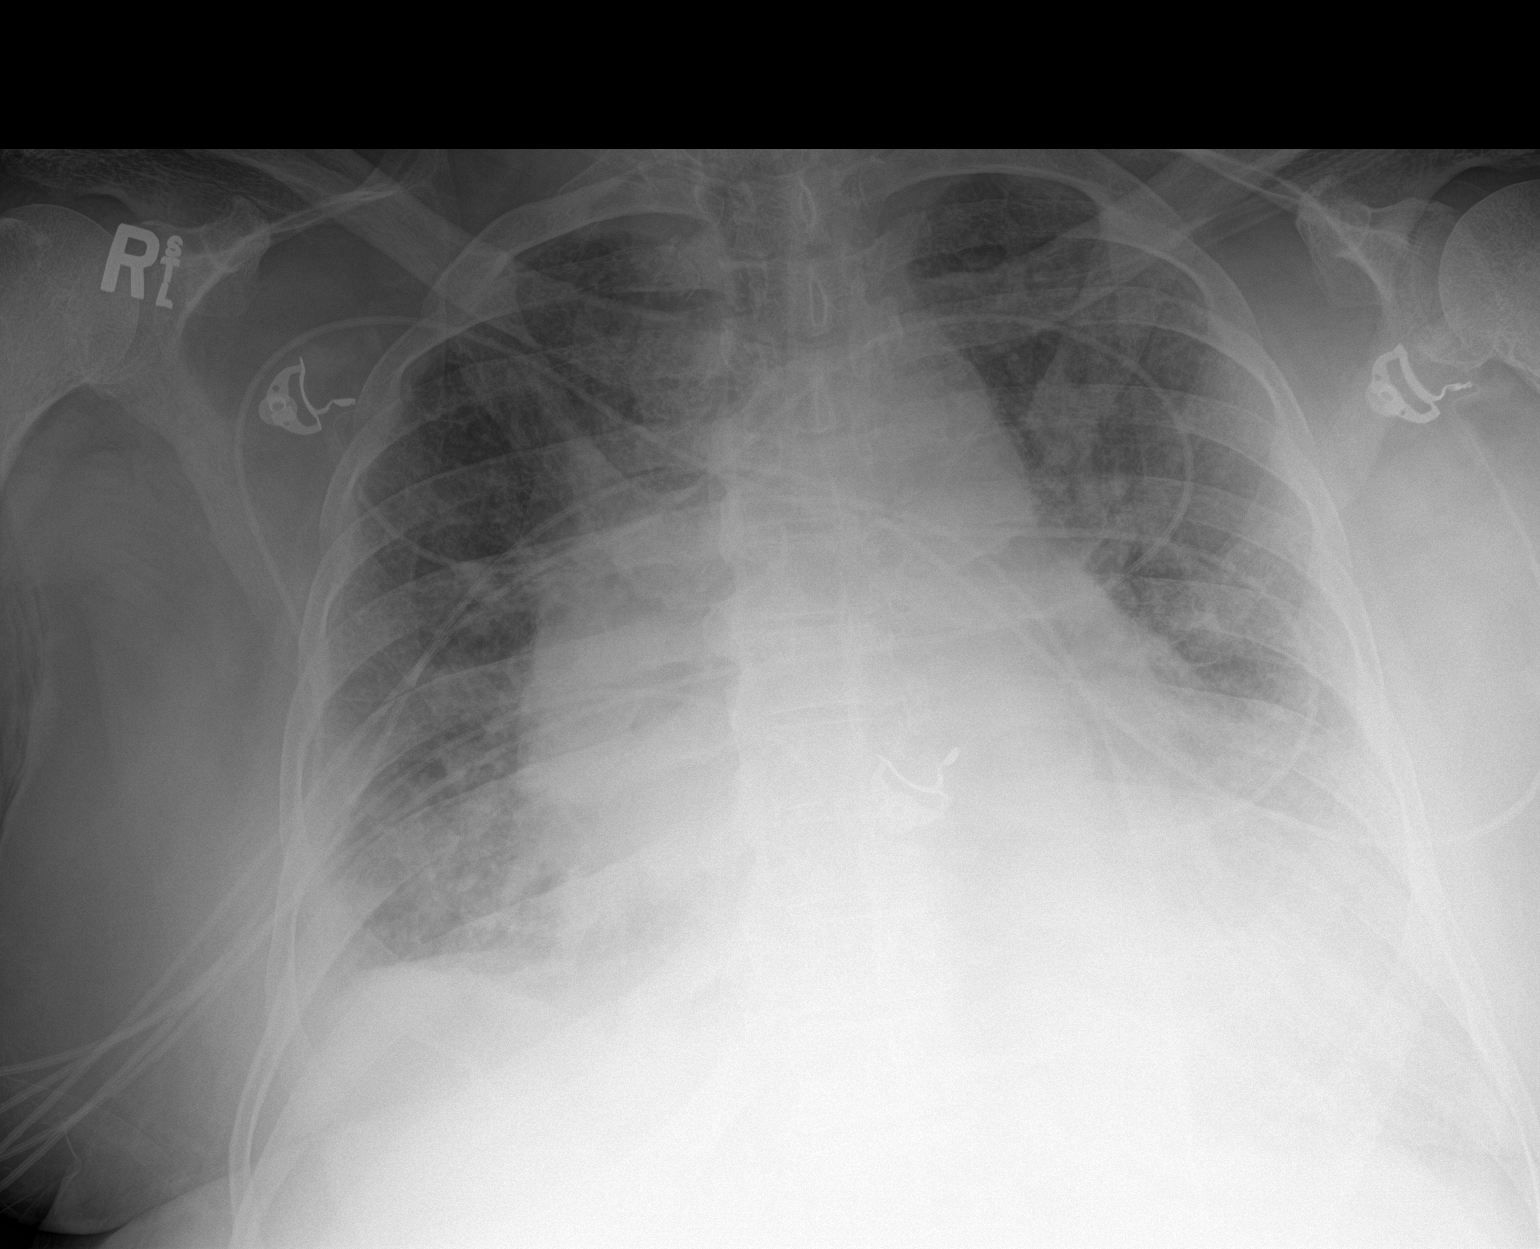

[1 of 1 positions shown; findings below may reference images not displayed]

FINDINGS: Stable cardiomediastinal silhouette with cardiomegaly. No
pneumothorax. Stable small right pleural effusion. Moderate
pulmonary edema appears slightly worsened. Bibasilar lung opacities
appear stable.
IMPRESSION: 1. Moderate congestive heart failure, slightly worsened.
2. Stable small right pleural effusion.
3. Stable bibasilar lung opacities, favor atelectasis.

## 2018-02-10 IMAGING — DX DG CHEST 1V PORT
1 series · 1 of 1 positions shown · non-contrast
Comparison: 09/06/2016

CLINICAL DATA: Acute respiratory failure

EXAM:
PORTABLE CHEST 1 VIEW

[chest ap]
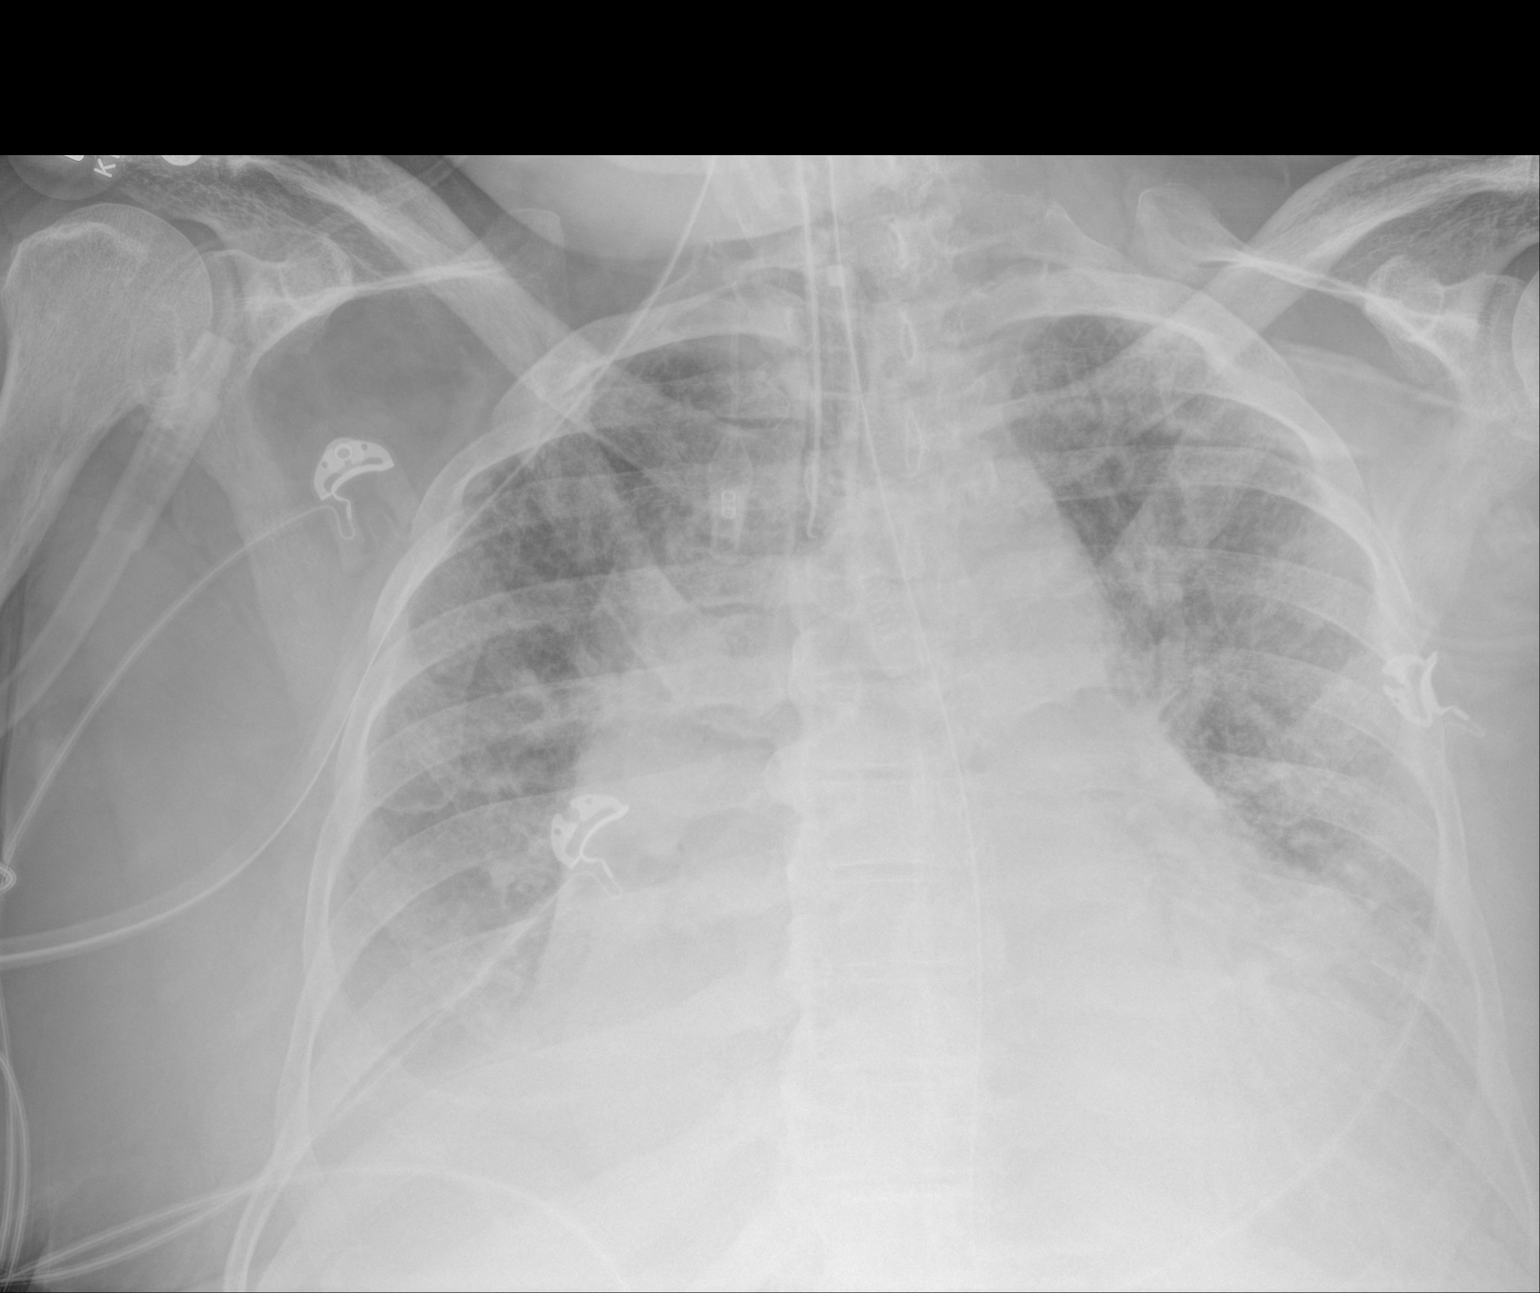

[1 of 1 positions shown; findings below may reference images not displayed]

FINDINGS: Cardiac shadow is again enlarged. An endotracheal tube and
nasogastric catheter are again noted in satisfactory position.
Diffuse vascular congestion is noted. Right-sided pleural effusion
is noted. No focal confluent infiltrate is seen although likely
right basilar atelectasis is present. No bony abnormality is noted.
IMPRESSION: Congestive failure and right pleural effusion.

## 2018-02-11 IMAGING — DX DG CHEST 1V PORT
1 series · 1 of 1 positions shown · non-contrast
Comparison: 09/07/2016.

CLINICAL DATA: Respiratory failure.

EXAM:
PORTABLE CHEST 1 VIEW

[chest ap]
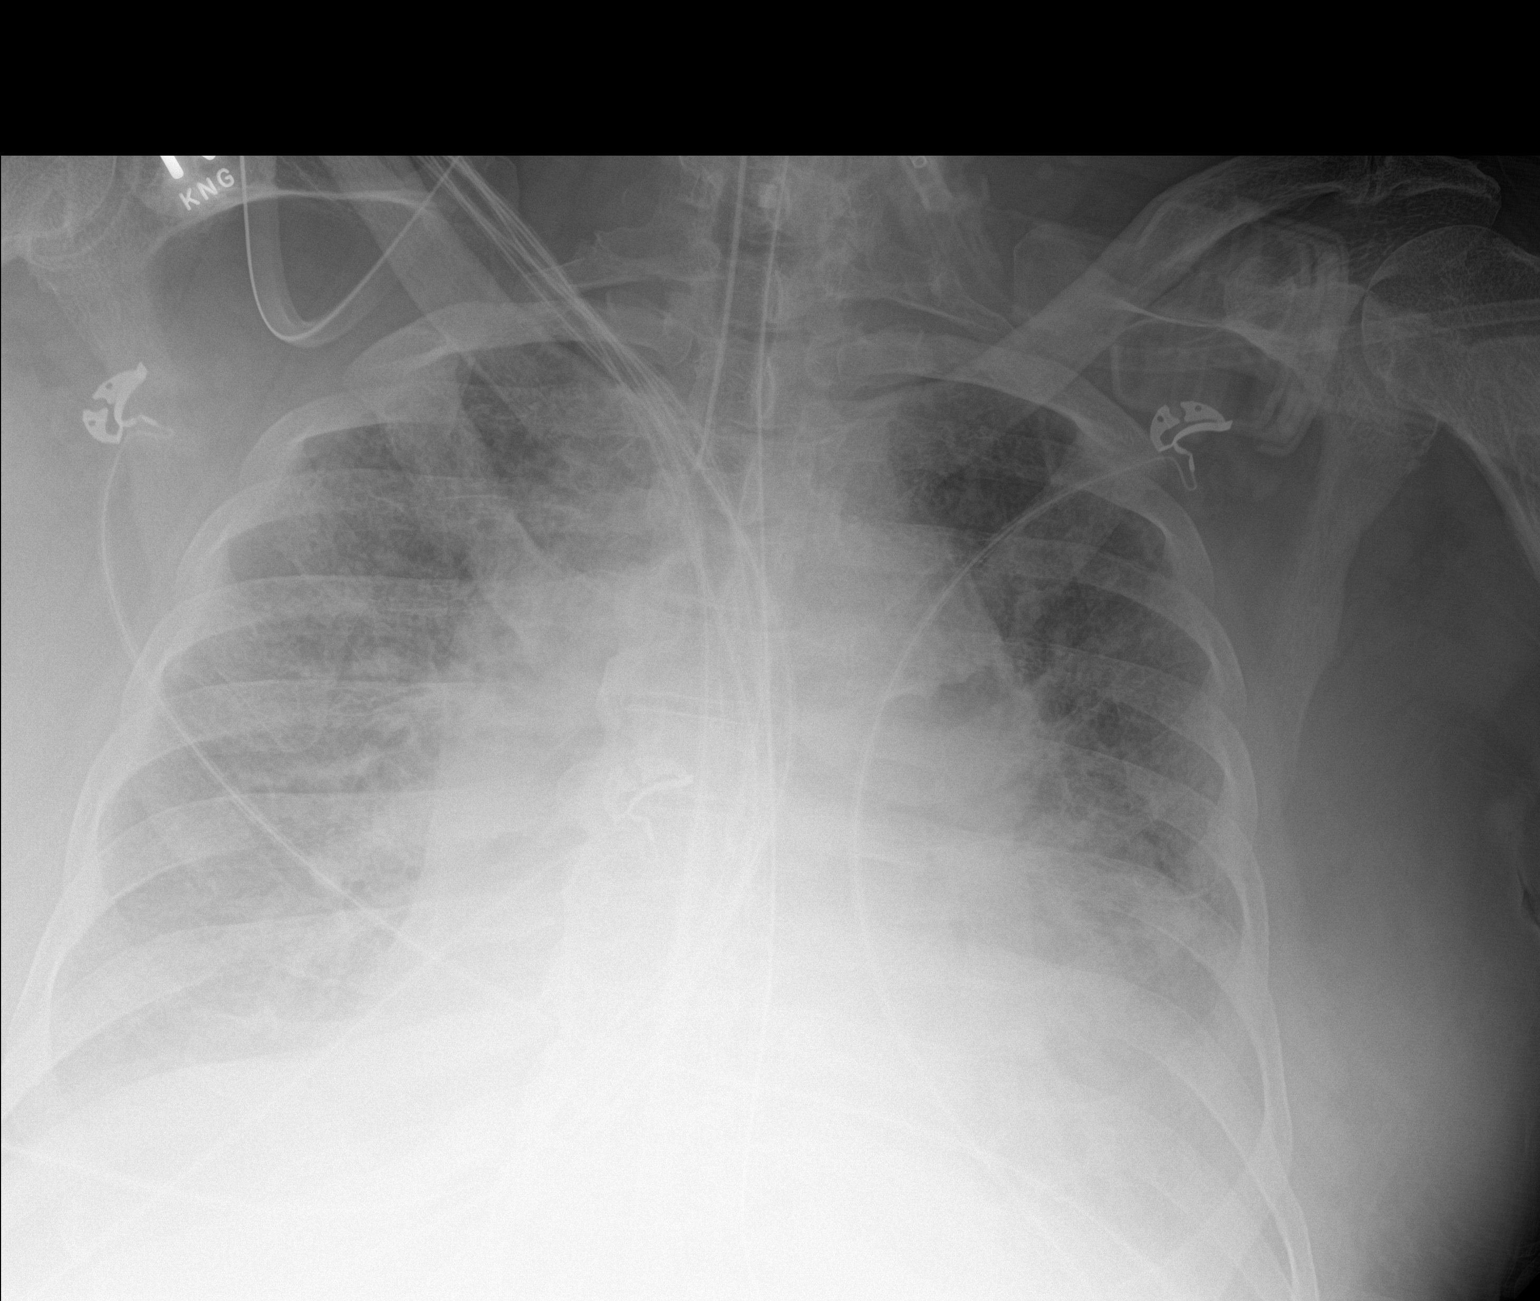

[1 of 1 positions shown; findings below may reference images not displayed]

FINDINGS: Endotracheal tube and NG tube in stable position. Cardiomegaly with
diffuse bilateral pulmonary infiltrates and small pleural effusions
noted. Findings consistent with congestive heart failure. Similar
findings noted on prior exam.
IMPRESSION: 1. Lines and tubes in stable position.

2. Persistent congestive heart failure with bilateral pulmonary
edema and bilateral effusions. No significant change.

## 2018-02-12 IMAGING — DX DG CHEST 1V PORT
1 series · 1 of 1 positions shown · non-contrast
Comparison: 09/08/2016, 09/07/2016

CLINICAL DATA: 70-year-old male with a history of respiratory
failure

EXAM:
PORTABLE CHEST 1 VIEW

[chest ap]
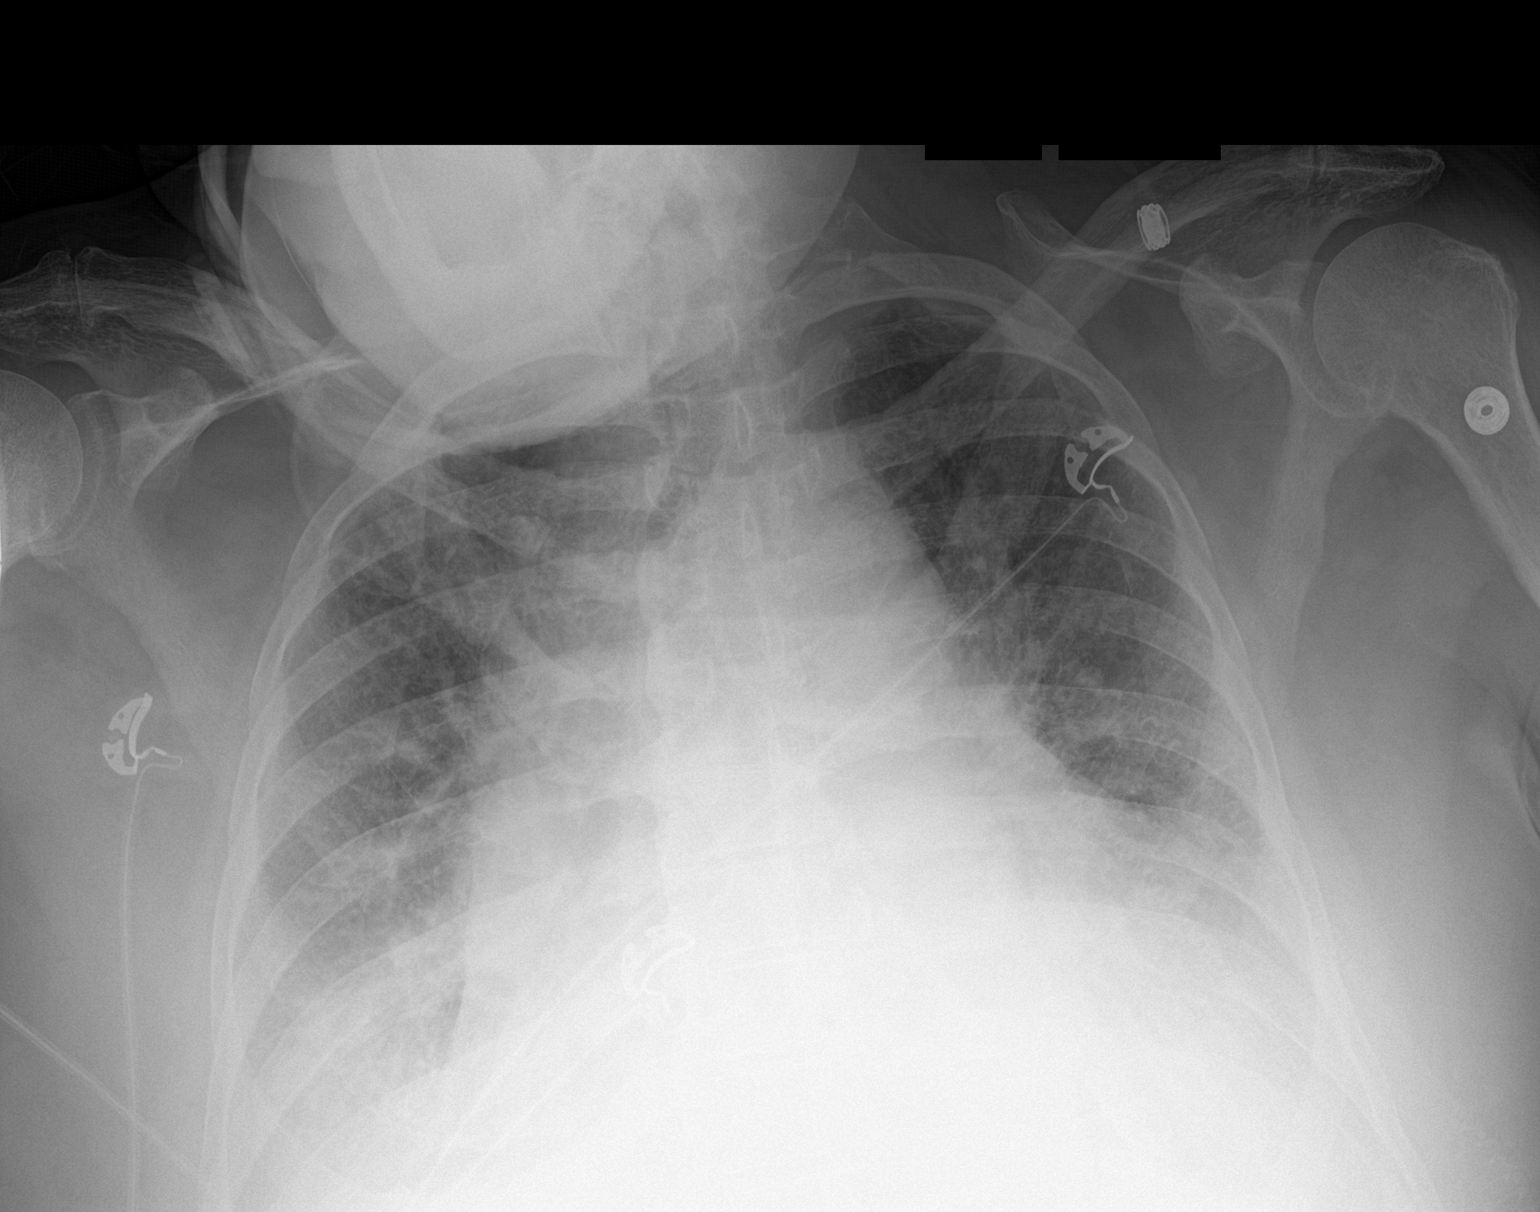

[1 of 1 positions shown; findings below may reference images not displayed]

FINDINGS: Cardiomediastinal silhouette unchanged from prior with partial
obscuration of the heart border secondary to overlying lung and
pleural disease.

Low lung volumes with mixed interstitial and airspace opacities
bilaterally, predominantly lower lungs obscuring the hemidiaphragms.

No pneumothorax.

Interval extubation and removal of the gastric tube.
IMPRESSION: Similar appearance of the lungs with evidence of bilateral small
pleural effusions, atelectasis/ consolidation, and associated edema.

Interval extubation and removal of the gastric tube.

## 2018-02-23 ENCOUNTER — Ambulatory Visit (HOSPITAL_COMMUNITY): Payer: Medicare Other

## 2018-03-01 ENCOUNTER — Ambulatory Visit (HOSPITAL_COMMUNITY): Admission: RE | Admit: 2018-03-01 | Payer: Medicare Other | Source: Ambulatory Visit

## 2018-03-10 ENCOUNTER — Ambulatory Visit (HOSPITAL_COMMUNITY): Admission: RE | Admit: 2018-03-10 | Payer: Medicare Other | Source: Ambulatory Visit

## 2018-03-15 ENCOUNTER — Emergency Department (HOSPITAL_COMMUNITY)
Admission: EM | Admit: 2018-03-15 | Discharge: 2018-03-16 | Disposition: A | Discharge status: Home / Self Care | Payer: Medicare Other | Attending: Emergency Medicine | Admitting: Emergency Medicine

## 2018-03-15 ENCOUNTER — Other Ambulatory Visit: Payer: Self-pay

## 2018-03-15 ENCOUNTER — Emergency Department (HOSPITAL_COMMUNITY): Payer: Medicare Other

## 2018-03-15 DIAGNOSIS — E119 Type 2 diabetes mellitus without complications: Secondary | ICD-10-CM | POA: Insufficient documentation

## 2018-03-15 DIAGNOSIS — Z79899 Other long term (current) drug therapy: Secondary | ICD-10-CM | POA: Insufficient documentation

## 2018-03-15 DIAGNOSIS — I509 Heart failure, unspecified: Secondary | ICD-10-CM | POA: Diagnosis not present

## 2018-03-15 DIAGNOSIS — F17210 Nicotine dependence, cigarettes, uncomplicated: Secondary | ICD-10-CM | POA: Insufficient documentation

## 2018-03-15 DIAGNOSIS — I110 Hypertensive heart disease with heart failure: Secondary | ICD-10-CM | POA: Diagnosis not present

## 2018-03-15 DIAGNOSIS — I5023 Acute on chronic systolic (congestive) heart failure: Secondary | ICD-10-CM | POA: Diagnosis not present

## 2018-03-15 DIAGNOSIS — R0602 Shortness of breath: Secondary | ICD-10-CM | POA: Diagnosis not present

## 2018-03-15 DIAGNOSIS — Z794 Long term (current) use of insulin: Secondary | ICD-10-CM | POA: Insufficient documentation

## 2018-03-15 DIAGNOSIS — I2510 Atherosclerotic heart disease of native coronary artery without angina pectoris: Secondary | ICD-10-CM | POA: Diagnosis not present

## 2018-03-15 DIAGNOSIS — J441 Chronic obstructive pulmonary disease with (acute) exacerbation: Secondary | ICD-10-CM | POA: Diagnosis not present

## 2018-03-15 LAB — CBC
HCT: 35.7 % — ABNORMAL LOW (ref 39.0–52.0)
Hemoglobin: 10.4 g/dL — ABNORMAL LOW (ref 13.0–17.0)
MCH: 26.8 pg (ref 26.0–34.0)
MCHC: 29.1 g/dL — AB (ref 30.0–36.0)
MCV: 92 fL (ref 78.0–100.0)
PLATELETS: 331 10*3/uL (ref 150–400)
RBC: 3.88 MIL/uL — ABNORMAL LOW (ref 4.22–5.81)
RDW: 16.8 % — AB (ref 11.5–15.5)
WBC: 6.7 10*3/uL (ref 4.0–10.5)

## 2018-03-15 LAB — I-STAT TROPONIN, ED: TROPONIN I, POC: 0.04 ng/mL (ref 0.00–0.08)

## 2018-03-15 LAB — BASIC METABOLIC PANEL
Anion gap: 7 (ref 5–15)
BUN: 6 mg/dL (ref 6–20)
CHLORIDE: 101 mmol/L (ref 101–111)
CO2: 32 mmol/L (ref 22–32)
CREATININE: 0.77 mg/dL (ref 0.61–1.24)
Calcium: 8.5 mg/dL — ABNORMAL LOW (ref 8.9–10.3)
GFR calc Af Amer: >60 mL/min (ref >60)
GFR calc non Af Amer: >60 mL/min (ref >60)
GLUCOSE: 117 mg/dL — AB (ref 65–99)
Potassium: 4.2 mmol/L (ref 3.5–5.1)
Sodium: 140 mmol/L (ref 135–145)

## 2018-03-15 MED ORDER — IPRATROPIUM-ALBUTEROL 0.5-2.5 (3) MG/3ML IN SOLN
3.0000 mL | Freq: Once | RESPIRATORY_TRACT | Status: AC
  Administered 2018-03-16: 3 mL via RESPIRATORY_TRACT
  Filled 2018-03-15: qty 3

## 2018-03-15 MED ORDER — FUROSEMIDE 10 MG/ML IJ SOLN
40.0000 mg | Freq: Once | INTRAMUSCULAR | Status: AC
  Administered 2018-03-16: 40 mg via INTRAVENOUS
  Filled 2018-03-15: qty 4

## 2018-03-15 MED ORDER — METHYLPREDNISOLONE SODIUM SUCC 125 MG IJ SOLR
125.0000 mg | Freq: Once | INTRAMUSCULAR | Status: AC
  Administered 2018-03-16: 125 mg via INTRAVENOUS
  Filled 2018-03-15: qty 2

## 2018-03-15 NOTE — ED Triage Notes (Signed)
Patient c/o shortness of breath and that he is in heart failure. Patient c/o abdominal distention that began one week ago.

## 2018-03-15 NOTE — ED Provider Notes (Signed)
[facility] EMERGENCY DEPARTMENT Provider Note   CSN: [id number] Arrival date & time: 03/15/18  2137     History   Chief Complaint Chief Complaint  Patient presents with  . Shortness of Breath    HPI Nicholas Caldwell is a 72 y.o. male.  Patient is a 72 year old male with past medical history of CHF, COPD, coronary artery disease with stent, paroxysmal A. fib, and nonischemic cardiomyopathy.  He presents today for evaluation of difficulty breathing.  This is worsened over the past several days.  He reports increased dyspnea on exertion and orthopnea during this period of time.  He has been using his breathing treatments at home with little relief.  He reports being compliant with his Lasix.  His symptoms are worse with exertion and relieved somewhat with rest.  He denies any fevers, chills, or productive cough.  The history is provided by the patient.  Shortness of Breath  This is a new problem. The problem occurs continuously.Episode onset: Several days ago. The problem has been gradually worsening. Associated symptoms include orthopnea and leg swelling. Pertinent negatives include no fever, no sputum production, no chest pain and no leg pain. Associated medical issues include COPD and CAD.    Past Medical History:  Diagnosis Date  . Atrial flutter ([place])    a. recurrent AFlutter with RVR;  b. Amiodarone Rx started 4/16  . CAD (coronary artery disease)    a. LHC 1/16:  mLAD diffuse disease, pLCx mild disease, dLCx with disease but too small for PCI, RCA ok, EF 25-30%  . Chronic pain   . Chronic systolic CHF (congestive heart failure) ([place])   . COPD (chronic obstructive pulmonary disease) ([place])   . Diabetes mellitus without complication ([place])   . Hypercholesteremia   . Hypertension   . NICM (nonischemic cardiomyopathy) ([place])    a.dx 2016. b. 2D echo 06/2016 - Last echo 07/01/16: mod dilated LV, mod LVH, EF 25-30%, mild-mod MR, sev LAE, mild-mod reduced RV systolic  function, mild-mod TR, PASP 51mHG.  .Marland KitchenPAF (paroxysmal atrial fibrillation) (H[facility])    On amio - ot a candidate for flecainide due to cardiomyopathy, not a candidate for Tikosyn due to prolonged QT, and felt to be a poor candidate for ablation given left atrial size.  . Prolonged Q-T interval on ECG   . Pulmonary hypertension ([place]   . Tobacco abuse     Patient Active Problem List   Diagnosis Date Noted  . Chronic respiratory failure with hypoxia ([place] 12/28/2017  . Atrial fibrillation with RVR ([place]   . SVT (supraventricular tachycardia) ([place]   . Torsades de pointes ([place]   . COPD GOLD 0   . Medically noncompliant   . Panlobular emphysema ([place]   . OSA (obstructive sleep apnea)   . Pulmonary hypertension ([place]   . Disorientation   . Pressure injury of skin 09/07/2016  . Skin lesion-left heal 09/05/2016  . Chronic systolic CHF (congestive heart failure) ([place] 07/16/2016  . COPD (chronic obstructive pulmonary disease) ([place] 07/16/2016  . Uncontrolled type 2 diabetes mellitus with complication ([place]   . Diabetic polyneuropathy associated with diabetes mellitus due to underlying condition ([place]   . Normocytic anemia 06/29/2016  . CAD - Non-obstructive by LHC1/16 01/21/2016  . Prolonged QT interval 10/09/2015  . Nonischemic cardiomyopathy ([place] 10/09/2015  . PAF (paroxysmal atrial fibrillation) ([place]   . Chronic pain 02/19/2015  . Morbid obesity ([place] 02/13/2015  . DOE (dyspnea on exertion)   . Elevated troponin I  level 11/01/2014  . Essential hypertension 10/31/2014  . Type 2 diabetes mellitus with neuropathy 10/31/2014  . Hyperlipidemia  10/31/2014  . Cigarette smoker 10/31/2014    Past Surgical History:  Procedure Laterality Date  . LEFT AND RIGHT HEART CATHETERIZATION WITH CORONARY ANGIOGRAM N/A 11/06/2014   Procedure: LEFT AND RIGHT HEART CATHETERIZATION WITH CORONARY ANGIOGRAM;  Surgeon: Jettie Booze, MD;  Location: [facility] CATH LAB;  Service: Cardiovascular;  Laterality:  N/A;  . SPLENECTOMY          Home Medications    Prior to Admission medications   Medication Sig Start Date End Date Taking? Authorizing Provider  albuterol (PROVENTIL HFA;VENTOLIN HFA) 108 (90 BASE) MCG/ACT inhaler Inhale 2 puffs into the lungs every 6 (six) hours as needed for wheezing or shortness of breath. 11/07/14   Kinnie Feil, MD  budesonide-formoterol (SYMBICORT) 160-4.5 MCG/ACT inhaler Inhale 2 puffs into the lungs 2 (two) times daily.    [provider]  carvedilol (COREG) 12.5 MG tablet Take 1 tablet (12.5 mg total) by mouth 2 (two) times daily with a meal. 11/05/16   Regalado, Belkys A, MD  diazepam (VALIUM) 10 MG tablet Take 0.5 tablets (5 mg total) by mouth daily as needed for anxiety or sleep. 12/19/16   Theodis Blaze, MD  diclofenac sodium (VOLTAREN) 1 % GEL Apply 2 g topically 2 (two) times daily as needed (for pain).    [provider]  fluticasone (FLONASE) 50 MCG/ACT nasal spray Place 2 sprays into both nostrils daily as needed for allergies.     [provider]  gabapentin (NEURONTIN) 600 MG tablet Take 1 tablet (600 mg total) by mouth 2 (two) times daily. 12/19/16   Theodis Blaze, MD  glucose 4 GM chewable tablet Chew 4 tablets by mouth See admin instructions. Chew 4 tablets by mouth as needed for low blood sugar - repeat every 15 minutes if blood sugar less than 70    [provider]  hydrocortisone (ANUSOL-HC) 2.5 % rectal cream Place 1 application rectally 2 (two) times daily as needed for hemorrhoids or itching. Use up to 2 weeks at a time as needed    [provider]  hydrocortisone 1 % lotion Apply 1 application topically See admin instructions. Apply small amount to back twice daily for itchy rash    [provider]  insulin aspart (NOVOLOG) 100 UNIT/ML injection Inject 30 Units into the skin 2 (two) times daily.    [provider]  ipratropium (ATROVENT) 0.02 % nebulizer solution Take 2.5 mLs (0.5 mg  total) by nebulization every 6 (six) hours as needed for wheezing or shortness of breath. 12/19/16   Theodis Blaze, MD  magnesium oxide (MAG-OX) 400 (241.3 Mg) MG tablet Take 1 tablet (400 mg total) by mouth 2 (two) times daily. 02/27/17   Bensimhon, Shaune Pascal, MD  metFORMIN (GLUCOPHAGE-XR) 500 MG 24 hr tablet Take 1,000 mg by mouth 2 (two) times daily with a meal.    [provider]  metolazone (ZAROXOLYN) 2.5 MG tablet Take 1 tablet (2.5 mg total) by mouth as directed. 08/06/17   Shirley Friar, PA-C  OXYGEN 3lpm 24/7    [provider]  potassium chloride SA (K-DUR,KLOR-CON) 20 MEQ tablet Take 2 tablets (40 mEq total) by mouth daily. 11/07/14   Kinnie Feil, MD  rivaroxaban (XARELTO) 20 MG TABS tablet Take 1 tablet (20 mg total) by mouth daily with supper. 01/20/17   Jettie Booze, MD  rosuvastatin (  CRESTOR) 40 MG tablet Take 0.5 tablets (20 mg total) by mouth daily. For cholesterol 01/07/17   Shirley Friar, PA-C  spironolactone (ALDACTONE) 25 MG tablet Take 1 tablet (25 mg total) by mouth at bedtime. 02/04/18 02/04/19  Shirley Friar, PA-C  torsemide (DEMADEX) 20 MG tablet Take 2 tablets (40 mg total) by mouth 2 (two) times daily. 08/06/17   Shirley Friar, PA-C    Family History Family History  Problem Relation Age of Onset  . Heart disease Mother   . Hypertension Mother   . Heart failure Mother   . Heart disease Father   . Hypertension Sister   . Heart attack Neg Hx   . Stroke Neg Hx     Social History Social History   Tobacco Use  . Smoking status: Current Every Day Smoker    Packs/day: 1.00    Years: 34.00    Pack years: 34.00    Types: Cigarettes  . Smokeless tobacco: Never Used  Substance Use Topics  . Alcohol use: No    Alcohol/week: 0.0 oz  . Drug use: No     Allergies   Patient has no known allergies.   Review of Systems Review of Systems  Constitutional: Negative for fever.  Respiratory: Positive for  shortness of breath. Negative for sputum production.   Cardiovascular: Positive for orthopnea and leg swelling. Negative for chest pain.  All other systems reviewed and are negative.    Physical Exam Updated Vital Signs BP (!) 155/67   Pulse 84   Temp 99.5 F (37.5 C) (Oral)   Resp (!) 22   Ht 6' (1.829 m)   Wt 121.6 kg (268 lb)   SpO2 92%   BMI 36.35 kg/m   Physical Exam  Constitutional: He is oriented to person, place, and time. He appears well-developed and well-nourished. No distress.  HENT:  Head: Normocephalic and atraumatic.  Mouth/Throat: Oropharynx is clear and moist.  Neck: Normal range of motion. Neck supple.  Cardiovascular: Normal rate and regular rhythm. Exam reveals no friction rub.  No murmur heard. Pulmonary/Chest: Effort normal. No respiratory distress. He has no wheezes. He has rhonchi. He has no rales.  He has expiratory rhonchi bilaterally.  Abdominal: Soft. Bowel sounds are normal. He exhibits no distension. There is no tenderness.  Musculoskeletal: Normal range of motion.       Right lower leg: He exhibits edema. He exhibits no tenderness.       Left lower leg: He exhibits edema. He exhibits no tenderness.  There is trace edema of both lower extremities.  Neurological: He is alert and oriented to person, place, and time. Coordination normal.  Skin: Skin is warm and dry. He is not diaphoretic.  Nursing note and vitals reviewed.    ED Treatments / Results  Labs (all labs ordered are listed, but only abnormal results are displayed) Labs Reviewed  BASIC METABOLIC PANEL - Abnormal; Notable for the following components:      Result Value   Glucose, Bld 117 (*)    Calcium 8.5 (*)    All other components within normal limits  CBC - Abnormal; Notable for the following components:   RBC 3.88 (*)    Hemoglobin 10.4 (*)    HCT 35.7 (*)    MCHC 29.1 (*)    RDW 16.8 (*)    All other components within normal limits  BRAIN NATRIURETIC PEPTIDE  I-STAT  TROPONIN, ED    EKG None  Radiology Dg Chest 2  View  Result Date: 03/15/2018 CLINICAL DATA:  Shortness of breath EXAM: CHEST - 2 VIEW COMPARISON:  Chest radiograph 12/28/2017 FINDINGS: There is unchanged cardiomegaly. Moderate pulmonary edema with small left pleural effusion. No pneumothorax. No acute osseous abnormality. IMPRESSION: Cardiomegaly and moderate pulmonary edema. Electronically Signed   By: Ulyses Jarred M.D.   On: 03/15/2018 22:56    Procedures Procedures (including critical care time)  Medications Ordered in ED Medications  furosemide (LASIX) injection 40 mg (has no administration in time range)  ipratropium-albuterol (DUONEB) 0.5-2.5 (3) MG/3ML nebulizer solution 3 mL (has no administration in time range)  methylPREDNISolone sodium succinate (SOLU-MEDROL) 125 mg/2 mL injection 125 mg (has no administration in time range)     Initial Impression / Assessment and Plan / ED Course  I have reviewed the triage vital signs and the nursing notes.  Pertinent labs & imaging results that were available during my care of the patient were reviewed by me and considered in my medical decision making (see chart for details).  Patient presents with dyspnea that I believe is related to both an exacerbation of CHF and an exacerbation of COPD.  His BNP is elevated above his baseline and chest x-ray is consistent with this.  He also has some rhonchorous breath sounds which did improve with a breathing treatment.  The patient is on home oxygen at 3 L by nasal cannula at all times.  He has been on this level of oxygen in the ER and saturations have maintained in the upper 90s.  I have discussed the disposition with him.  He was offered admission, however would prefer to go home.  I will have him increase his torsemide for the next 3 days and also prescribed prednisone and regular nebulizer treatments.  He understands to return if his symptoms worsen.  Final Clinical Impressions(s) / ED  Diagnoses   Final diagnoses:  None    ED Discharge Orders    None       Veryl Speak, MD 03/16/18 [phone]

## 2018-03-16 LAB — BRAIN NATRIURETIC PEPTIDE: B Natriuretic Peptide: 866.2 pg/mL — ABNORMAL HIGH (ref 0.0–100.0)

## 2018-03-16 MED ORDER — PREDNISONE 10 MG PO TABS: 20.0000 mg | ORAL_TABLET | Freq: Two times a day (BID) | ORAL | 0 refills | Status: DC

## 2018-03-16 NOTE — Discharge Instructions (Addendum)
Prednisone as prescribed.  Increase your torsemide to 60 mg twice daily for the next 3 days.  Continue your albuterol nebulizer treatments every 4 hours for the next 2 to 3 days.  Return to the emergency department if symptoms significantly worsen or change.

## 2018-03-16 NOTE — ED Notes (Signed)
Contacted lab to add on BNP.

## 2018-03-25 ENCOUNTER — Ambulatory Visit (HOSPITAL_COMMUNITY): Admission: RE | Admit: 2018-03-25 | Payer: Medicare Other | Source: Ambulatory Visit

## 2018-03-31 ENCOUNTER — Encounter (HOSPITAL_COMMUNITY): Payer: Medicare Other

## 2018-04-04 IMAGING — CR DG CHEST 1V PORT
1 series · 1 of 1 positions shown · non-contrast
Comparison: Most recent radiograph 09/09/2016

CLINICAL DATA: Shortness of breath.

EXAM:
PORTABLE CHEST 1 VIEW

[AP]
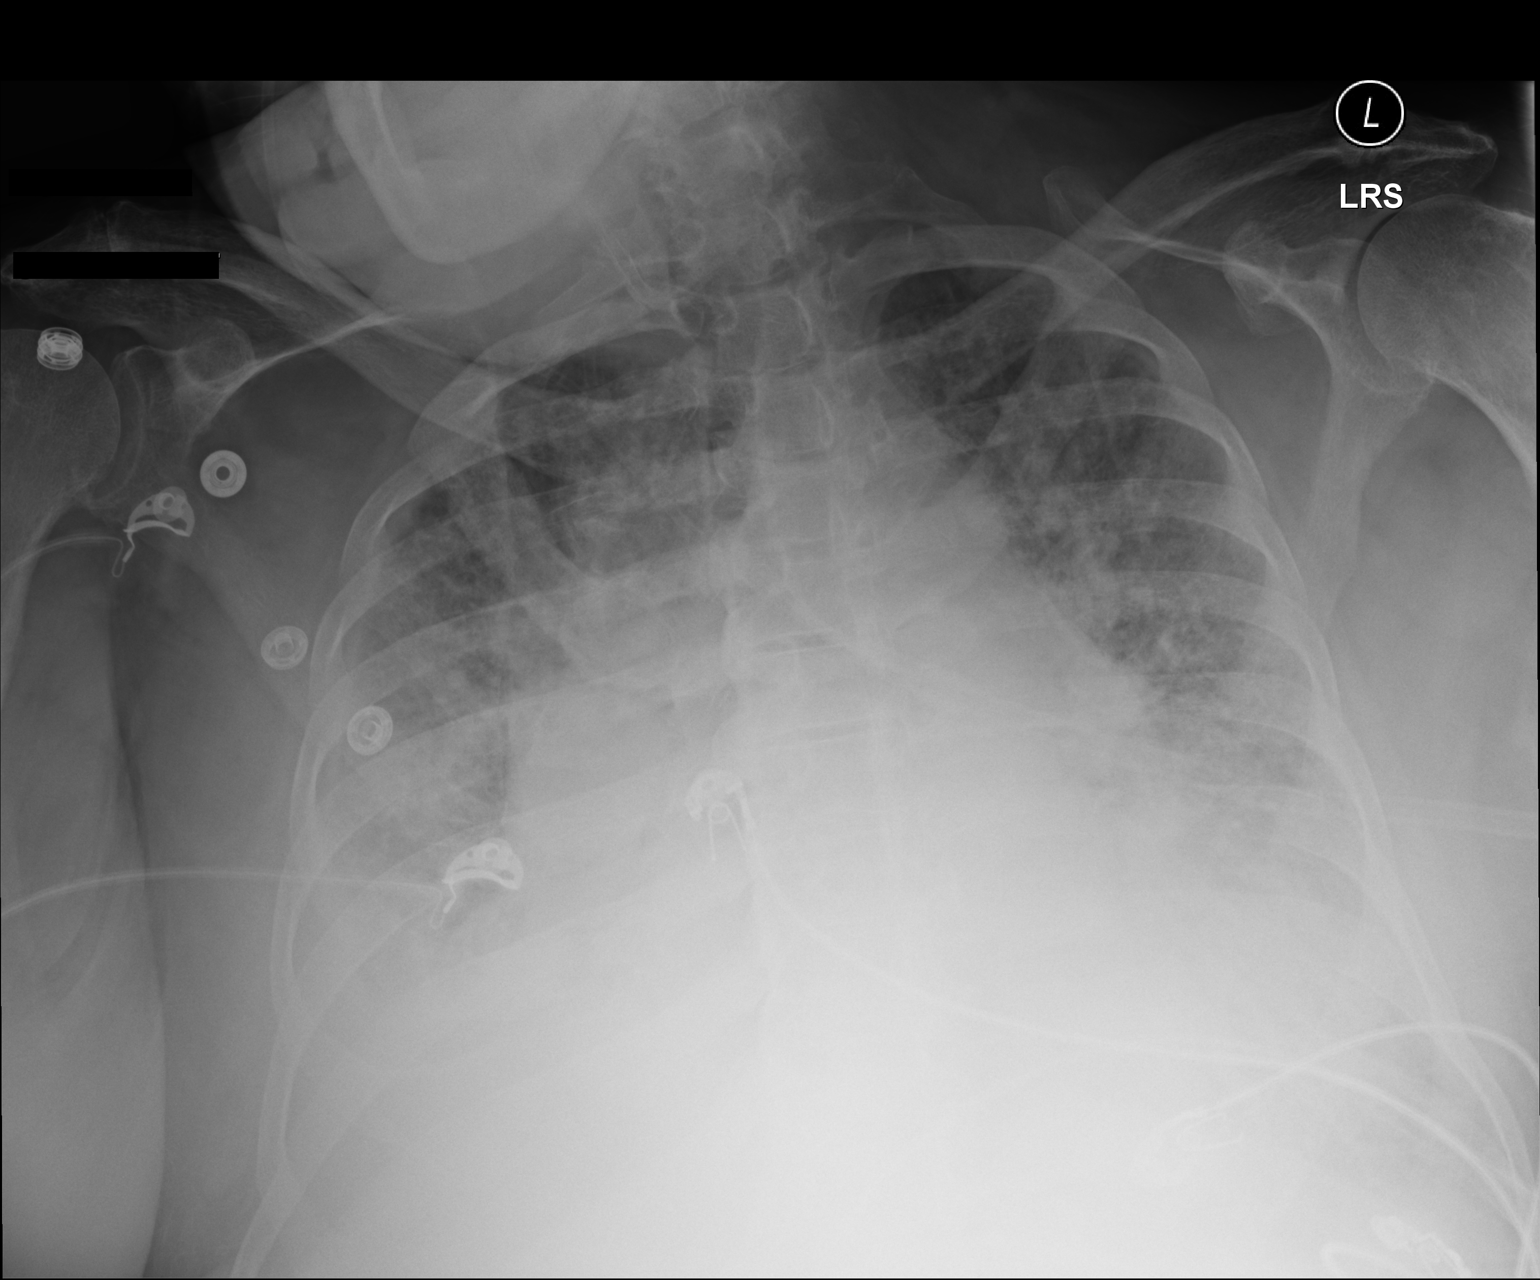

[1 of 1 positions shown; findings below may reference images not displayed]

FINDINGS: Cardiomegaly and mediastinal contours are stable. Bilateral pleural
effusions and hazy lung base opacities, similar to prior exam. There
is worsening vascular congestion and pulmonary edema. No
pneumothorax.
IMPRESSION: Worsening vascular congestion and pulmonary edema. Cardiomegaly and
pleural effusions are stable. Findings suggest acute on chronic
congestive heart failure.

## 2018-04-05 IMAGING — CR DG CHEST 1V PORT
1 series · 1 of 1 positions shown · non-contrast
Comparison: 10/30/2016

CLINICAL DATA: Respiratory failure, shortness of Breath

EXAM:
PORTABLE CHEST 1 VIEW

[AP]
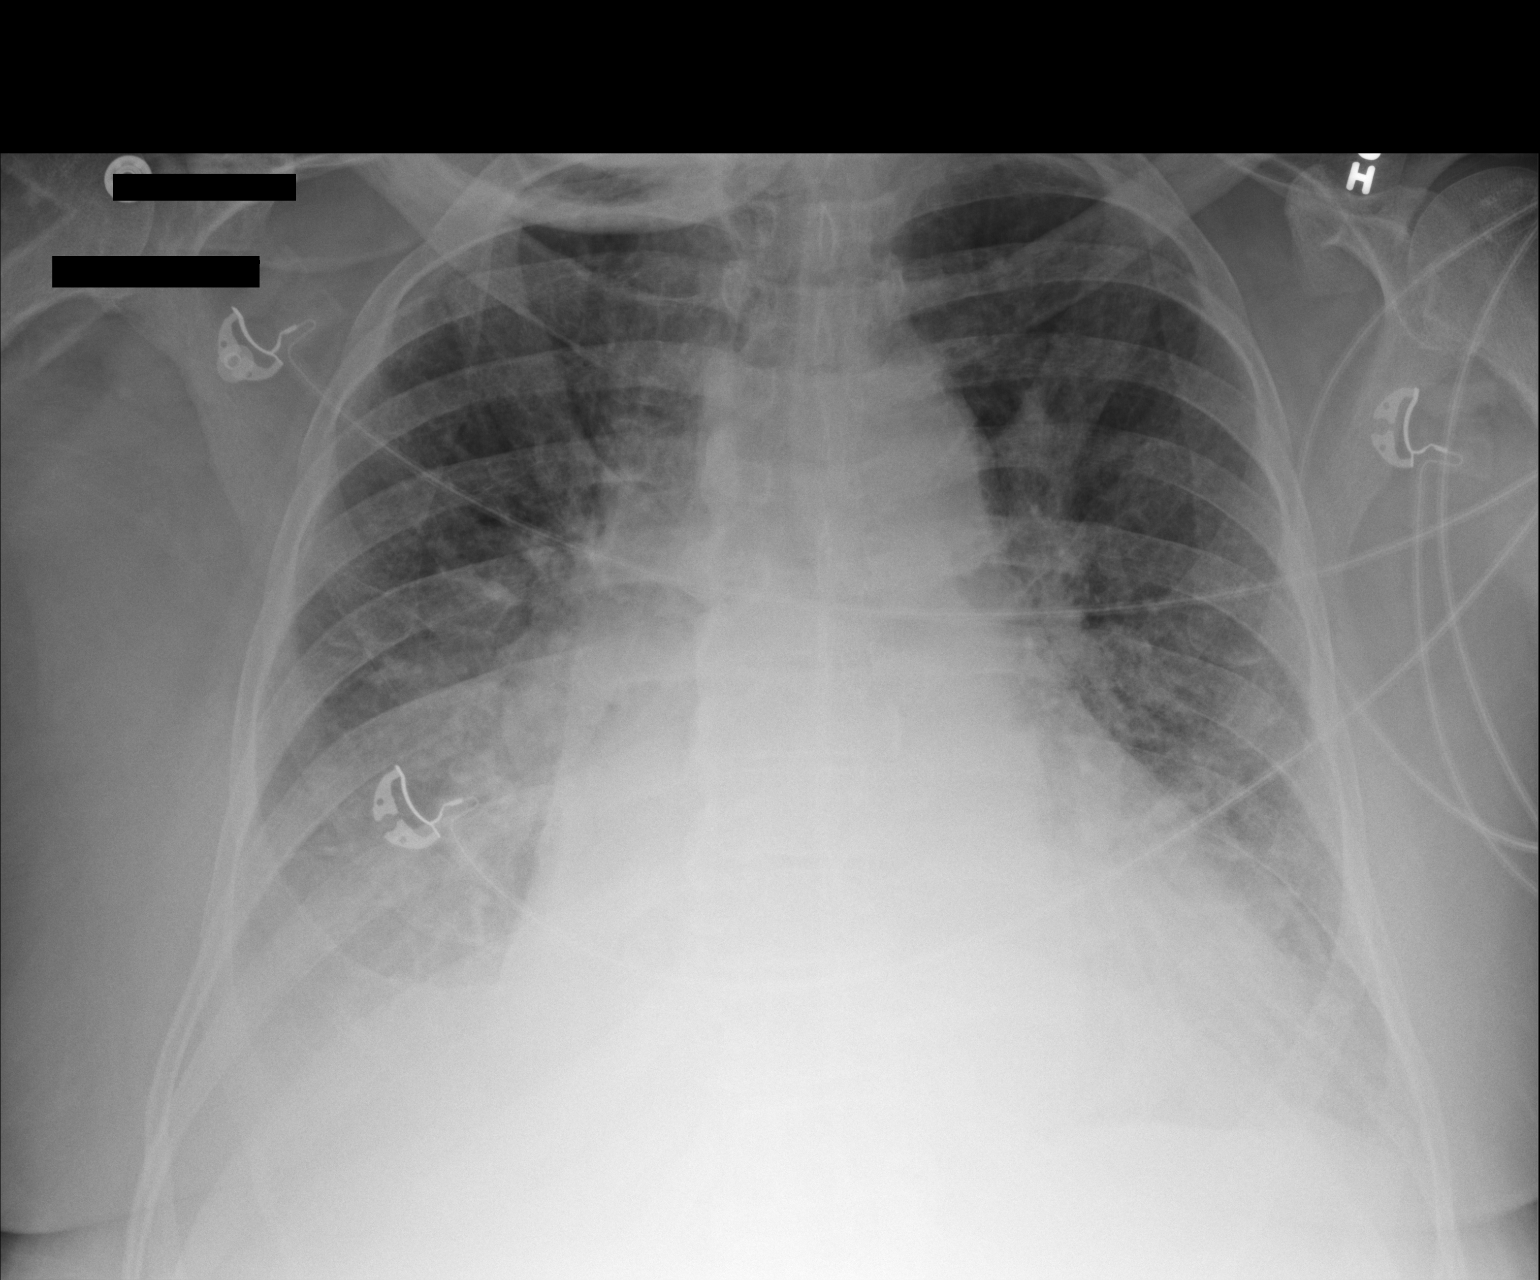

[1 of 1 positions shown; findings below may reference images not displayed]

FINDINGS: Cardiomegaly with vascular congestion and bilateral airspace
opacities compatible with edema/ CHF. Layering effusions. Edema
pattern has slightly improved since prior study. No acute bony
abnormality.
IMPRESSION: Moderate CHF, slightly improved since prior study.

Bilateral effusions, stable.

## 2018-04-07 ENCOUNTER — Ambulatory Visit (HOSPITAL_COMMUNITY)
Admission: RE | Admit: 2018-04-07 | Discharge: 2018-04-07 | Disposition: A | Discharge status: Home / Self Care | Payer: Medicare Other | Source: Ambulatory Visit | Attending: Student | Admitting: Student

## 2018-04-07 DIAGNOSIS — Z720 Tobacco use: Secondary | ICD-10-CM | POA: Insufficient documentation

## 2018-04-07 DIAGNOSIS — I110 Hypertensive heart disease with heart failure: Secondary | ICD-10-CM | POA: Diagnosis not present

## 2018-04-07 DIAGNOSIS — I361 Nonrheumatic tricuspid (valve) insufficiency: Secondary | ICD-10-CM | POA: Diagnosis not present

## 2018-04-07 DIAGNOSIS — I4891 Unspecified atrial fibrillation: Secondary | ICD-10-CM | POA: Diagnosis not present

## 2018-04-07 DIAGNOSIS — I5022 Chronic systolic (congestive) heart failure: Secondary | ICD-10-CM

## 2018-04-07 DIAGNOSIS — I2510 Atherosclerotic heart disease of native coronary artery without angina pectoris: Secondary | ICD-10-CM | POA: Insufficient documentation

## 2018-04-07 DIAGNOSIS — I4892 Unspecified atrial flutter: Secondary | ICD-10-CM | POA: Insufficient documentation

## 2018-04-07 NOTE — Progress Notes (Signed)
  Echocardiogram 2D Echocardiogram has been performed.  Jennette Dubin 04/07/2018, 3:58 PM

## 2018-04-13 ENCOUNTER — Encounter (HOSPITAL_COMMUNITY): Payer: Medicare Other

## 2018-04-30 ENCOUNTER — Encounter (HOSPITAL_COMMUNITY): Payer: Self-pay

## 2018-04-30 ENCOUNTER — Inpatient Hospital Stay (HOSPITAL_COMMUNITY)
Admission: EM | Admit: 2018-04-30 | Discharge: 2018-05-06 | DRG: 291 | Disposition: A | Discharge status: Home Health | Payer: Medicare Other | Attending: Family Medicine | Admitting: Family Medicine

## 2018-04-30 ENCOUNTER — Other Ambulatory Visit: Payer: Self-pay

## 2018-04-30 ENCOUNTER — Emergency Department (HOSPITAL_COMMUNITY): Payer: Medicare Other

## 2018-04-30 DIAGNOSIS — Z794 Long term (current) use of insulin: Secondary | ICD-10-CM | POA: Diagnosis not present

## 2018-04-30 DIAGNOSIS — I509 Heart failure, unspecified: Secondary | ICD-10-CM

## 2018-04-30 DIAGNOSIS — R609 Edema, unspecified: Secondary | ICD-10-CM | POA: Diagnosis not present

## 2018-04-30 DIAGNOSIS — E785 Hyperlipidemia, unspecified: Secondary | ICD-10-CM | POA: Diagnosis present

## 2018-04-30 DIAGNOSIS — E876 Hypokalemia: Secondary | ICD-10-CM | POA: Diagnosis not present

## 2018-04-30 DIAGNOSIS — R062 Wheezing: Secondary | ICD-10-CM | POA: Diagnosis not present

## 2018-04-30 DIAGNOSIS — I5023 Acute on chronic systolic (congestive) heart failure: Secondary | ICD-10-CM | POA: Diagnosis present

## 2018-04-30 DIAGNOSIS — I110 Hypertensive heart disease with heart failure: Principal | ICD-10-CM | POA: Diagnosis present

## 2018-04-30 DIAGNOSIS — Z8249 Family history of ischemic heart disease and other diseases of the circulatory system: Secondary | ICD-10-CM | POA: Diagnosis not present

## 2018-04-30 DIAGNOSIS — Z79899 Other long term (current) drug therapy: Secondary | ICD-10-CM

## 2018-04-30 DIAGNOSIS — Z6837 Body mass index (BMI) 37.0-37.9, adult: Secondary | ICD-10-CM

## 2018-04-30 DIAGNOSIS — E1142 Type 2 diabetes mellitus with diabetic polyneuropathy: Secondary | ICD-10-CM | POA: Diagnosis present

## 2018-04-30 DIAGNOSIS — F17210 Nicotine dependence, cigarettes, uncomplicated: Secondary | ICD-10-CM | POA: Diagnosis present

## 2018-04-30 DIAGNOSIS — Z7901 Long term (current) use of anticoagulants: Secondary | ICD-10-CM | POA: Diagnosis not present

## 2018-04-30 DIAGNOSIS — J431 Panlobular emphysema: Secondary | ICD-10-CM | POA: Diagnosis present

## 2018-04-30 DIAGNOSIS — J9621 Acute and chronic respiratory failure with hypoxia: Secondary | ICD-10-CM | POA: Diagnosis present

## 2018-04-30 DIAGNOSIS — I493 Ventricular premature depolarization: Secondary | ICD-10-CM | POA: Diagnosis present

## 2018-04-30 DIAGNOSIS — Z9981 Dependence on supplemental oxygen: Secondary | ICD-10-CM

## 2018-04-30 DIAGNOSIS — G4733 Obstructive sleep apnea (adult) (pediatric): Secondary | ICD-10-CM | POA: Diagnosis present

## 2018-04-30 DIAGNOSIS — Z7952 Long term (current) use of systemic steroids: Secondary | ICD-10-CM | POA: Diagnosis not present

## 2018-04-30 DIAGNOSIS — I480 Paroxysmal atrial fibrillation: Secondary | ICD-10-CM | POA: Diagnosis present

## 2018-04-30 DIAGNOSIS — I2510 Atherosclerotic heart disease of native coronary artery without angina pectoris: Secondary | ICD-10-CM | POA: Diagnosis present

## 2018-04-30 DIAGNOSIS — J441 Chronic obstructive pulmonary disease with (acute) exacerbation: Secondary | ICD-10-CM

## 2018-04-30 DIAGNOSIS — R410 Disorientation, unspecified: Secondary | ICD-10-CM | POA: Diagnosis not present

## 2018-04-30 DIAGNOSIS — Z7951 Long term (current) use of inhaled steroids: Secondary | ICD-10-CM

## 2018-04-30 DIAGNOSIS — I428 Other cardiomyopathies: Secondary | ICD-10-CM | POA: Diagnosis present

## 2018-04-30 DIAGNOSIS — J189 Pneumonia, unspecified organism: Secondary | ICD-10-CM

## 2018-04-30 DIAGNOSIS — M1610 Unilateral primary osteoarthritis, unspecified hip: Secondary | ICD-10-CM | POA: Diagnosis present

## 2018-04-30 DIAGNOSIS — R0602 Shortness of breath: Secondary | ICD-10-CM | POA: Diagnosis not present

## 2018-04-30 DIAGNOSIS — Z9081 Acquired absence of spleen: Secondary | ICD-10-CM | POA: Diagnosis not present

## 2018-04-30 DIAGNOSIS — J811 Chronic pulmonary edema: Secondary | ICD-10-CM | POA: Diagnosis not present

## 2018-04-30 DIAGNOSIS — R748 Abnormal levels of other serum enzymes: Secondary | ICD-10-CM | POA: Diagnosis not present

## 2018-04-30 DIAGNOSIS — R069 Unspecified abnormalities of breathing: Secondary | ICD-10-CM | POA: Diagnosis not present

## 2018-04-30 DIAGNOSIS — N179 Acute kidney failure, unspecified: Secondary | ICD-10-CM | POA: Diagnosis not present

## 2018-04-30 LAB — CBC
HCT: 37.4 % — ABNORMAL LOW (ref 39.0–52.0)
HEMOGLOBIN: 10.2 g/dL — AB (ref 13.0–17.0)
MCH: 25.5 pg — ABNORMAL LOW (ref 26.0–34.0)
MCHC: 27.3 g/dL — AB (ref 30.0–36.0)
MCV: 93.5 fL (ref 78.0–100.0)
PLATELETS: 296 10*3/uL (ref 150–400)
RBC: 4 MIL/uL — AB (ref 4.22–5.81)
RDW: 16.4 % — ABNORMAL HIGH (ref 11.5–15.5)
WBC: 9.8 10*3/uL (ref 4.0–10.5)

## 2018-04-30 LAB — BASIC METABOLIC PANEL
ANION GAP: 9 (ref 5–15)
BUN: 9 mg/dL (ref 8–23)
CALCIUM: 8.6 mg/dL — AB (ref 8.9–10.3)
CO2: 36 mmol/L — ABNORMAL HIGH (ref 22–32)
CREATININE: 0.93 mg/dL (ref 0.61–1.24)
Chloride: 99 mmol/L (ref 98–111)
Glucose, Bld: 134 mg/dL — ABNORMAL HIGH (ref 70–99)
Potassium: 4.8 mmol/L (ref 3.5–5.1)
Sodium: 144 mmol/L (ref 135–145)

## 2018-04-30 LAB — I-STAT TROPONIN, ED: Troponin i, poc: 0.05 ng/mL (ref 0.00–0.08)

## 2018-04-30 LAB — MAGNESIUM: MAGNESIUM: 2 mg/dL (ref 1.7–2.4)

## 2018-04-30 LAB — BRAIN NATRIURETIC PEPTIDE: B Natriuretic Peptide: 1912.5 pg/mL — ABNORMAL HIGH (ref 0.0–100.0)

## 2018-04-30 MED ORDER — ONDANSETRON HCL 4 MG/2ML IJ SOLN: 4.0000 mg | Freq: Four times a day (QID) | INTRAMUSCULAR | Status: DC | PRN

## 2018-04-30 MED ORDER — FLUTICASONE FUROATE-VILANTEROL 200-25 MCG/INH IN AEPB
1.0000 | INHALATION_SPRAY | Freq: Every day | RESPIRATORY_TRACT | Status: DC
  Administered 2018-05-02 – 2018-05-06 (×5): 1 via RESPIRATORY_TRACT
  Filled 2018-04-30: qty 28

## 2018-04-30 MED ORDER — ONDANSETRON HCL 4 MG PO TABS: 4.0000 mg | ORAL_TABLET | Freq: Four times a day (QID) | ORAL | Status: DC | PRN

## 2018-04-30 MED ORDER — CARVEDILOL 12.5 MG PO TABS
12.5000 mg | ORAL_TABLET | Freq: Two times a day (BID) | ORAL | Status: DC
  Administered 2018-05-01 – 2018-05-06 (×11): 12.5 mg via ORAL
  Filled 2018-04-30 (×11): qty 1

## 2018-04-30 MED ORDER — ROSUVASTATIN CALCIUM 20 MG PO TABS
20.0000 mg | ORAL_TABLET | Freq: Every day | ORAL | Status: DC
  Administered 2018-05-02 – 2018-05-06 (×5): 20 mg via ORAL
  Filled 2018-04-30 (×2): qty 1
  Filled 2018-04-30 (×2): qty 2
  Filled 2018-04-30: qty 1
  Filled 2018-04-30: qty 2
  Filled 2018-04-30: qty 1
  Filled 2018-04-30 (×2): qty 2
  Filled 2018-04-30: qty 1

## 2018-04-30 MED ORDER — FUROSEMIDE 10 MG/ML IJ SOLN
80.0000 mg | Freq: Once | INTRAMUSCULAR | Status: AC
  Administered 2018-04-30: 80 mg via INTRAVENOUS
  Filled 2018-04-30: qty 8

## 2018-04-30 MED ORDER — ALBUTEROL (5 MG/ML) CONTINUOUS INHALATION SOLN
5.0000 mg/h | INHALATION_SOLUTION | RESPIRATORY_TRACT | Status: AC
  Administered 2018-04-30: 5 mg/h via RESPIRATORY_TRACT
  Filled 2018-04-30: qty 20

## 2018-04-30 MED ORDER — IPRATROPIUM-ALBUTEROL 0.5-2.5 (3) MG/3ML IN SOLN
3.0000 mL | Freq: Four times a day (QID) | RESPIRATORY_TRACT | Status: DC
  Administered 2018-05-01 (×4): 3 mL via RESPIRATORY_TRACT
  Filled 2018-04-30 (×4): qty 3

## 2018-04-30 MED ORDER — DICLOFENAC SODIUM 1 % TD GEL
2.0000 g | Freq: Two times a day (BID) | TRANSDERMAL | Status: DC | PRN
  Filled 2018-04-30: qty 100

## 2018-04-30 MED ORDER — IPRATROPIUM BROMIDE 0.02 % IN SOLN
0.5000 mg | Freq: Once | RESPIRATORY_TRACT | Status: AC
  Administered 2018-04-30: 0.5 mg via RESPIRATORY_TRACT
  Filled 2018-04-30: qty 2.5

## 2018-04-30 MED ORDER — METOLAZONE 2.5 MG PO TABS: 2.5000 mg | ORAL_TABLET | ORAL | Status: DC

## 2018-04-30 MED ORDER — ASPIRIN EC 81 MG PO TBEC
81.0000 mg | DELAYED_RELEASE_TABLET | Freq: Every day | ORAL | Status: DC
  Administered 2018-05-01 – 2018-05-05 (×5): 81 mg via ORAL
  Filled 2018-04-30 (×5): qty 1

## 2018-04-30 MED ORDER — SODIUM CHLORIDE 0.9 % IV SOLN
1.0000 g | Freq: Once | INTRAVENOUS | Status: AC
  Administered 2018-04-30: 1 g via INTRAVENOUS
  Filled 2018-04-30: qty 10

## 2018-04-30 MED ORDER — SPIRONOLACTONE 25 MG PO TABS
25.0000 mg | ORAL_TABLET | Freq: Every day | ORAL | Status: DC
  Administered 2018-05-01 – 2018-05-05 (×5): 25 mg via ORAL
  Filled 2018-04-30 (×5): qty 1

## 2018-04-30 MED ORDER — ACETAMINOPHEN 650 MG RE SUPP: 650.0000 mg | Freq: Four times a day (QID) | RECTAL | Status: DC | PRN

## 2018-04-30 MED ORDER — LISINOPRIL 10 MG PO TABS
10.0000 mg | ORAL_TABLET | Freq: Every day | ORAL | Status: DC
  Administered 2018-05-01 – 2018-05-05 (×5): 10 mg via ORAL
  Filled 2018-04-30 (×5): qty 1

## 2018-04-30 MED ORDER — MAGNESIUM SULFATE 2 GM/50ML IV SOLN
2.0000 g | Freq: Once | INTRAVENOUS | Status: AC
  Administered 2018-04-30: 2 g via INTRAVENOUS
  Filled 2018-04-30: qty 50

## 2018-04-30 MED ORDER — ACETAMINOPHEN 325 MG PO TABS: 650.0000 mg | ORAL_TABLET | Freq: Four times a day (QID) | ORAL | Status: DC | PRN

## 2018-04-30 MED ORDER — METHYLPREDNISOLONE SODIUM SUCC 125 MG IJ SOLR
125.0000 mg | Freq: Four times a day (QID) | INTRAMUSCULAR | Status: DC
  Administered 2018-05-01 – 2018-05-02 (×8): 125 mg via INTRAVENOUS
  Filled 2018-04-30 (×8): qty 2

## 2018-04-30 MED ORDER — DOXYCYCLINE HYCLATE 100 MG PO TABS: 100.0000 mg | ORAL_TABLET | Freq: Once | ORAL | Status: DC

## 2018-04-30 MED ORDER — GABAPENTIN 600 MG PO TABS
600.0000 mg | ORAL_TABLET | Freq: Two times a day (BID) | ORAL | Status: DC
  Administered 2018-05-01 – 2018-05-06 (×10): 600 mg via ORAL
  Filled 2018-04-30 (×10): qty 1

## 2018-04-30 MED ORDER — RIVAROXABAN 20 MG PO TABS
20.0000 mg | ORAL_TABLET | Freq: Every day | ORAL | Status: DC
  Administered 2018-05-01 – 2018-05-05 (×5): 20 mg via ORAL
  Filled 2018-04-30 (×5): qty 1

## 2018-04-30 NOTE — ED Provider Notes (Signed)
[facility] EMERGENCY DEPARTMENT Provider Note   CSN: [id number] Arrival date & time: 04/30/18  2037  History   Chief Complaint Chief Complaint  Patient presents with  . Shortness of Breath    HPI Nicholas Caldwell is a 72 y.o. male.  The history is provided by the patient and a relative.  Shortness of Breath  This is a new problem. The average episode lasts 2 days. The problem occurs continuously.The problem has been gradually worsening. Associated symptoms include sputum production, wheezing and leg swelling. Pertinent negatives include no fever, no sore throat, no ear pain, no cough, no chest pain, no vomiting, no abdominal pain and no rash. He has tried beta-agonist inhalers for the symptoms. The treatment provided mild relief. He has had prior hospitalizations. Associated medical issues include COPD and heart failure.    Past Medical History:  Diagnosis Date  . Atrial flutter ([place])    a. recurrent AFlutter with RVR;  b. Amiodarone Rx started 4/16  . CAD (coronary artery disease)    a. LHC 1/16:  mLAD diffuse disease, pLCx mild disease, dLCx with disease but too small for PCI, RCA ok, EF 25-30%  . Chronic pain   . Chronic systolic CHF (congestive heart failure) ([place])   . COPD (chronic obstructive pulmonary disease) ([place])   . Diabetes mellitus without complication ([place])   . Hypercholesteremia   . Hypertension   . NICM (nonischemic cardiomyopathy) ([place])    a.dx 2016. b. 2D echo 06/2016 - Last echo 07/01/16: mod dilated LV, mod LVH, EF 25-30%, mild-mod MR, sev LAE, mild-mod reduced RV systolic function, mild-mod TR, PASP 34mHG.  .Marland KitchenPAF (paroxysmal atrial fibrillation) (HCC)    On amio - ot a candidate for flecainide due to cardiomyopathy, not a candidate for Tikosyn due to prolonged QT, and felt to be a poor candidate for ablation given left atrial size.  . Prolonged Q-T interval on ECG   . Pulmonary hypertension ([place]   . Tobacco abuse     Patient Active Problem  List   Diagnosis Date Noted  . CHF (congestive heart failure) ([place] 04/30/2018  . Chronic respiratory failure with hypoxia ([place] 12/28/2017  . Atrial fibrillation with RVR ([place]   . SVT (supraventricular tachycardia) ([place]   . Torsades de pointes ([place]   . COPD GOLD 0   . Medically noncompliant   . Panlobular emphysema ([place]   . OSA (obstructive sleep apnea)   . Pulmonary hypertension ([place]   . Disorientation   . Pressure injury of skin 09/07/2016  . Skin lesion-left heal 09/05/2016  . Chronic systolic CHF (congestive heart failure) (HC[place] 07/16/2016  . COPD (chronic obstructive pulmonary disease) ([place] 07/16/2016  . Uncontrolled type 2 diabetes mellitus with complication ([facility]   . Diabetic polyneuropathy associated with diabetes mellitus due to underlying condition ([place]   . Normocytic anemia 06/29/2016  . CAD - Non-obstructive by LHC1/16 01/21/2016  . Prolonged QT interval 10/09/2015  . Nonischemic cardiomyopathy ([place] 10/09/2015  . PAF (paroxysmal atrial fibrillation) ([place]   . Chronic pain 02/19/2015  . Morbid obesity ([place] 02/13/2015  . DOE (dyspnea on exertion)   . Elevated troponin I level 11/01/2014  . Essential hypertension 10/31/2014  . Type 2 diabetes mellitus with neuropathy 10/31/2014  . Hyperlipidemia  10/31/2014  . Cigarette smoker 10/31/2014    Past Surgical History:  Procedure Laterality Date  . LEFT AND RIGHT HEART CATHETERIZATION WITH CORONARY ANGIOGRAM N/A 11/06/2014   Procedure: LEFT AND RIGHT HEART CATHETERIZATION WITH CORONARY ANGIOGRAM;  Surgeon: Jettie Booze, MD;  Location: [facility] CATH LAB;  Service: Cardiovascular;  Laterality: N/A;  . SPLENECTOMY          Home Medications    Prior to Admission medications   Medication Sig Start Date End Date Taking? Authorizing Provider  diazepam (VALIUM) 10 MG tablet Take 0.5 tablets (5 mg total) by mouth daily as needed for anxiety or sleep. 12/19/16  Yes Theodis Blaze, MD  oxyCODONE-acetaminophen  (PERCOCET) 10-325 MG tablet Take 1 tablet by mouth every 6 (six) hours as needed for pain.   Yes [provider]  OXYGEN Inhale 3 L into the lungs continuous.    Yes [provider]  albuterol (PROVENTIL HFA;VENTOLIN HFA) 108 (90 BASE) MCG/ACT inhaler Inhale 2 puffs into the lungs every 6 (six) hours as needed for wheezing or shortness of breath. 11/07/14   Kinnie Feil, MD  budesonide-formoterol (SYMBICORT) 160-4.5 MCG/ACT inhaler Inhale 2 puffs into the lungs 2 (two) times daily.    [provider]  carvedilol (COREG) 12.5 MG tablet Take 1 tablet (12.5 mg total) by mouth 2 (two) times daily with a meal. 11/05/16   Regalado, Belkys A, MD  diclofenac sodium (VOLTAREN) 1 % GEL Apply 2 g topically 2 (two) times daily as needed (for pain).    [provider]  fluticasone (FLONASE) 50 MCG/ACT nasal spray Place 2 sprays into both nostrils daily as needed for allergies.     [provider]  gabapentin (NEURONTIN) 600 MG tablet Take 1 tablet (600 mg total) by mouth 2 (two) times daily. 12/19/16   Theodis Blaze, MD  glucose 4 GM chewable tablet Chew 4 tablets by mouth See admin instructions. Chew 4 tablets by mouth as needed for low blood sugar - repeat every 15 minutes if blood sugar less than 70    [provider]  hydrocortisone (ANUSOL-HC) 2.5 % rectal cream Place 1 application rectally 2 (two) times daily as needed for hemorrhoids or itching. Use up to 2 weeks at a time as needed    [provider]  hydrocortisone 1 % lotion Apply 1 application topically See admin instructions. Apply small amount to back twice daily for itchy rash    [provider]  insulin aspart (NOVOLOG) 100 UNIT/ML injection Inject 30 Units into the skin 2 (two) times daily.    [provider]  ipratropium (ATROVENT) 0.02 % nebulizer solution Take 2.5 mLs (0.5 mg total) by nebulization every 6 (six) hours as needed for wheezing or shortness of breath.  12/19/16   Theodis Blaze, MD  magnesium oxide (MAG-OX) 400 (241.3 Mg) MG tablet Take 1 tablet (400 mg total) by mouth 2 (two) times daily. 02/27/17   Bensimhon, Shaune Pascal, MD  metFORMIN (GLUCOPHAGE-XR) 500 MG 24 hr tablet Take 1,000 mg by mouth 2 (two) times daily with a meal.    [provider]  metolazone (ZAROXOLYN) 2.5 MG tablet Take 1 tablet (2.5 mg total) by mouth as directed. 08/06/17   Shirley Friar, PA-C  potassium chloride SA (K-DUR,KLOR-CON) 20 MEQ tablet Take 2 tablets (40 mEq total) by mouth daily. 11/07/14   Kinnie Feil, MD  predniSONE (DELTASONE) 10 MG tablet Take 2 tablets (20 mg total) by mouth 2 (two) times daily with a meal. 03/16/18   Veryl Speak, MD  rivaroxaban (XARELTO) 20 MG TABS tablet Take 1 tablet (20 mg total) by mouth daily with supper. 01/20/17   Jettie Booze, MD  rosuvastatin (CRESTOR) 40  MG tablet Take 0.5 tablets (20 mg total) by mouth daily. For cholesterol 01/07/17   Shirley Friar, PA-C  spironolactone (ALDACTONE) 25 MG tablet Take 1 tablet (25 mg total) by mouth at bedtime. 02/04/18 02/04/19  Shirley Friar, PA-C  torsemide (DEMADEX) 20 MG tablet Take 2 tablets (40 mg total) by mouth 2 (two) times daily. 08/06/17   Shirley Friar, PA-C    Family History Family History  Problem Relation Age of Onset  . Heart disease Mother   . Hypertension Mother   . Heart failure Mother   . Heart disease Father   . Hypertension Sister   . Heart attack Neg Hx   . Stroke Neg Hx     Social History Social History   Tobacco Use  . Smoking status: Current Every Day Smoker    Packs/day: 1.00    Years: 34.00    Pack years: 34.00    Types: Cigarettes  . Smokeless tobacco: Never Used  Substance Use Topics  . Alcohol use: No    Alcohol/week: 0.0 oz  . Drug use: No     Allergies   Patient has no known allergies.   Review of Systems Review of Systems  Constitutional: Positive for fatigue. Negative for chills and  fever.  HENT: Negative for ear pain and sore throat.   Eyes: Negative for pain and visual disturbance.  Respiratory: Positive for sputum production, chest tightness, shortness of breath and wheezing. Negative for cough.   Cardiovascular: Positive for leg swelling. Negative for chest pain and palpitations.  Gastrointestinal: Negative for abdominal pain, nausea and vomiting.  Genitourinary: Negative for dysuria and hematuria.  Musculoskeletal: Negative for arthralgias and back pain.  Skin: Negative for color change and rash.  Neurological: Negative for seizures and syncope.  All other systems reviewed and are negative.    Physical Exam Updated Vital Signs BP 132/64   Pulse 77   Temp 99.3 F (37.4 C)   Resp (!) 26   Ht 6' (1.829 m)   Wt 121.6 kg (268 lb)   SpO2 (!) 88%   BMI 36.35 kg/m   Physical Exam  Constitutional: He appears well-developed and well-nourished.  Non-toxic appearance.  Morbidly obese  HENT:  Head: Normocephalic and atraumatic.  Mouth/Throat: Oropharynx is clear and moist.  Eyes: Conjunctivae and EOM are normal.  Neck: Neck supple.  Unable to assess JVD 2/2 body habitus  Cardiovascular: Normal rate, regular rhythm, normal heart sounds and intact distal pulses.  No murmur heard. Pulmonary/Chest: Tachypnea noted. No respiratory distress. He has decreased breath sounds. He has wheezes.  Tachypneic, speaking in 5-7 word sentences, no accessory muscle use, decreased breath sound throughout though diffuse inspiratory and expiratory wheezes throughout   Abdominal: Soft. He exhibits no distension. There is no tenderness.  Musculoskeletal: He exhibits no edema (2+ symmetric pitting edema to BLE).  Neurological: He is alert.  Skin: Skin is warm and dry.  Psychiatric: He has a normal mood and affect.  Nursing note and vitals reviewed.    ED Treatments / Results  Labs (all labs ordered are listed, but only abnormal results are displayed) Labs Reviewed  BASIC  METABOLIC PANEL - Abnormal; Notable for the following components:      Result Value   CO2 36 (*)    Glucose, Bld 134 (*)    Calcium 8.6 (*)    All other components within normal limits  CBC - Abnormal; Notable for the following components:   RBC 4.00 (*)  Hemoglobin 10.2 (*)    HCT 37.4 (*)    MCH 25.5 (*)    MCHC 27.3 (*)    RDW 16.4 (*)    All other components within normal limits  BRAIN NATRIURETIC PEPTIDE - Abnormal; Notable for the following components:   B Natriuretic Peptide 1,912.5 (*)    All other components within normal limits  CULTURE, BLOOD (ROUTINE X 2)  CULTURE, BLOOD (ROUTINE X 2)  MAGNESIUM  COMPREHENSIVE METABOLIC PANEL  MAGNESIUM  TROPONIN I  TROPONIN I  TROPONIN I  CBC WITH DIFFERENTIAL/PLATELET  PHOSPHORUS  HEMOGLOBIN A1C  LIPID PANEL  HIV ANTIBODY (ROUTINE TESTING)  I-STAT TROPONIN, ED    EKG None  Radiology Dg Chest 2 View  Result Date: 04/30/2018 CLINICAL DATA:  72 year old male with shortness of breath. EXAM: CHEST - 2 VIEW COMPARISON:  Chest radiograph dated 03/15/2018 FINDINGS: There is cardiomegaly with vascular congestion and edema. Small bilateral pleural effusions. Overall there has been interval worsening of the airspace densities compared to the prior radiograph. No pneumothorax. No acute osseous pathology. IMPRESSION: Cardiomegaly with findings of CHF, worsened since the study of 03/15/2018. Pneumonia is not excluded. Clinical correlation is recommended. Electronically Signed   By: Anner Crete M.D.   On: 04/30/2018 21:51    Procedures Procedures (including critical care time)  Medications Ordered in ED Medications  albuterol (PROVENTIL,VENTOLIN) solution continuous neb (5 mg/hr Nebulization New Bag/Given 04/30/18 2122)  cefTRIAXone (ROCEPHIN) 1 g in sodium chloride 0.9 % 100 mL IVPB (1 g Intravenous New Bag/Given 04/30/18 2348)  doxycycline (VIBRA-TABS) tablet 100 mg (100 mg Oral Not Given 04/30/18 2340)  methylPREDNISolone  sodium succinate (SOLU-MEDROL) 125 mg/2 mL injection 125 mg (has no administration in time range)  fluticasone furoate-vilanterol (BREO ELLIPTA) 200-25 MCG/INH 1 puff (has no administration in time range)  carvedilol (COREG) tablet 12.5 mg (has no administration in time range)  diclofenac sodium (VOLTAREN) 1 % transdermal gel 2 g (has no administration in time range)  gabapentin (NEURONTIN) tablet 600 mg (has no administration in time range)  ipratropium-albuterol (DUONEB) 0.5-2.5 (3) MG/3ML nebulizer solution 3 mL (has no administration in time range)  metolazone (ZAROXOLYN) tablet 2.5 mg (has no administration in time range)  rivaroxaban (XARELTO) tablet 20 mg (has no administration in time range)  rosuvastatin (CRESTOR) tablet 20 mg (has no administration in time range)  spironolactone (ALDACTONE) tablet 25 mg (has no administration in time range)  lisinopril (PRINIVIL,ZESTRIL) tablet 10 mg (has no administration in time range)  aspirin EC tablet 81 mg (has no administration in time range)  acetaminophen (TYLENOL) tablet 650 mg (has no administration in time range)    Or  acetaminophen (TYLENOL) suppository 650 mg (has no administration in time range)  ondansetron (ZOFRAN) tablet 4 mg (has no administration in time range)    Or  ondansetron (ZOFRAN) injection 4 mg (has no administration in time range)  ipratropium (ATROVENT) nebulizer solution 0.5 mg (0.5 mg Nebulization Given 04/30/18 2122)  magnesium sulfate IVPB 2 g 50 mL (0 g Intravenous Stopped 04/30/18 2340)  furosemide (LASIX) injection 80 mg (80 mg Intravenous Given 04/30/18 2343)     Initial Impression / Assessment and Plan / ED Course  I have reviewed the triage vital signs and the nursing notes.  Pertinent labs & imaging results that were available during my care of the patient were reviewed by me and considered in my medical decision making (see chart for details).     Nicholas Caldwell is a 72 y.o. male with  PMHx of HFrEF, COPD  (baseline 3L) who p/w dyspnea, increased sputum production x 2 days. Reviewed and confirmed nursing documentation for past medical history, family history, social history. VS afebrile, mild tachypnea, SpO2 90% on 3L [place], BP wnl. Exam remarkable for tachypnea with decreased breath sounds throught, wheezes, no respiratory distress. Ddx includes COPD exacerbation, CHF exacerbation, PNA.   Given IV solumedrol, albuterol, duoneb en route. Given 1 hr continuous albuterol with extra dose of ipratropium with improvement in wheezing, though still with decreased air movement throughout. IV mag given. EKG unremarkable.Trop neg x 1. BMP with increased CO2 36, otherwise unremarkable. CBC with no leukocytosis, stable normocytic anemia with hgb 10.2. BNP with 1912. CXR with cardiomegaly, vascular congestion and edema. Cannot r/o PNA. Blood cultures pending. IV ceftriaxone and doxycycline given. IV lasix given. Started on bipap.  Old records reviewed. Labs reviewed by me and used in the medical decision making.  Imaging viewed and interpreted by me and used in the medical decision making (formal interpretation from radiologist). EKG reviewed by me and used in the medical decision making. Admitted to family medicine.    Final Clinical Impressions(s) / ED Diagnoses   Final diagnoses:  COPD exacerbation ([place])  Community acquired pneumonia, unspecified laterality  Acute on chronic congestive heart failure, unspecified heart failure type ([place])  CHF (congestive heart failure) (HCC)     Norm Salt, MD 04/30/18 Joie Bimler    Jola Schmidt, MD 05/02/18 [phone]

## 2018-04-30 NOTE — ED Notes (Signed)
Family reports the pt has been having breathing problems for the past 3 days.   EMS reports that they were already out to their residence 1 time today and they refused treatment and transport.
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
## 2018-05-01 DIAGNOSIS — J189 Pneumonia, unspecified organism: Secondary | ICD-10-CM

## 2018-05-01 DIAGNOSIS — I509 Heart failure, unspecified: Secondary | ICD-10-CM

## 2018-05-01 DIAGNOSIS — J441 Chronic obstructive pulmonary disease with (acute) exacerbation: Secondary | ICD-10-CM

## 2018-05-01 LAB — COMPREHENSIVE METABOLIC PANEL
ALBUMIN: 2.9 g/dL — AB (ref 3.5–5.0)
ALK PHOS: 104 U/L (ref 38–126)
ALT: 12 U/L (ref 0–44)
ANION GAP: 10 (ref 5–15)
AST: 16 U/L (ref 15–41)
BILIRUBIN TOTAL: 0.5 mg/dL (ref 0.3–1.2)
BUN: 8 mg/dL (ref 8–23)
CHLORIDE: 97 mmol/L — AB (ref 98–111)
CO2: 37 mmol/L — AB (ref 22–32)
CREATININE: 0.89 mg/dL (ref 0.61–1.24)
Calcium: 8.5 mg/dL — ABNORMAL LOW (ref 8.9–10.3)
GFR calc Af Amer: >60 mL/min (ref >60)
GFR calc non Af Amer: >60 mL/min (ref >60)
Glucose, Bld: 158 mg/dL — ABNORMAL HIGH (ref 70–99)
Potassium: 4.4 mmol/L (ref 3.5–5.1)
Sodium: 144 mmol/L (ref 135–145)
Total Protein: 7.1 g/dL (ref 6.5–8.1)

## 2018-05-01 LAB — CBC WITH DIFFERENTIAL/PLATELET
ABS IMMATURE GRANULOCYTES: 0 10*3/uL (ref 0.0–0.1)
Basophils Absolute: 0 10*3/uL (ref 0.0–0.1)
Basophils Relative: 0 %
Eosinophils Absolute: 0 10*3/uL (ref 0.0–0.7)
Eosinophils Relative: 0 %
HEMATOCRIT: 36.8 % — AB (ref 39.0–52.0)
HEMOGLOBIN: 10.2 g/dL — AB (ref 13.0–17.0)
IMMATURE GRANULOCYTES: 0 %
LYMPHS ABS: 0.6 10*3/uL — AB (ref 0.7–4.0)
LYMPHS PCT: 9 %
MCH: 25.3 pg — ABNORMAL LOW (ref 26.0–34.0)
MCHC: 27.7 g/dL — ABNORMAL LOW (ref 30.0–36.0)
MCV: 91.3 fL (ref 78.0–100.0)
Monocytes Absolute: 0 10*3/uL — ABNORMAL LOW (ref 0.1–1.0)
Monocytes Relative: 1 %
NEUTROS PCT: 90 %
Neutro Abs: 5.7 10*3/uL (ref 1.7–7.7)
Platelets: 279 10*3/uL (ref 150–400)
RBC: 4.03 MIL/uL — AB (ref 4.22–5.81)
RDW: 16.5 % — ABNORMAL HIGH (ref 11.5–15.5)
WBC: 6.4 10*3/uL (ref 4.0–10.5)

## 2018-05-01 LAB — I-STAT ARTERIAL BLOOD GAS, ED
ACID-BASE EXCESS: 17 mmol/L — AB (ref 0.0–2.0)
Acid-Base Excess: 13 mmol/L — ABNORMAL HIGH (ref 0.0–2.0)
Bicarbonate: 41.1 mmol/L — ABNORMAL HIGH (ref 20.0–28.0)
Bicarbonate: 45 mmol/L — ABNORMAL HIGH (ref 20.0–28.0)
O2 Saturation: 87 %
O2 Saturation: 89 %
PCO2 ART: 72.1 mmHg — AB (ref 32.0–48.0)
PCO2 ART: 71.8 mmHg — AB (ref 32.0–48.0)
PH ART: 7.405 (ref 7.350–7.450)
PO2 ART: 60 mmHg — AB (ref 83.0–108.0)
Patient temperature: 99.3
TCO2: 43 mmol/L — AB (ref 22–32)
TCO2: 47 mmol/L — ABNORMAL HIGH (ref 22–32)
pH, Arterial: 7.366 (ref 7.350–7.450)
pO2, Arterial: 60 mmHg — ABNORMAL LOW (ref 83.0–108.0)

## 2018-05-01 LAB — PHOSPHORUS: Phosphorus: 4.1 mg/dL (ref 2.5–4.6)

## 2018-05-01 LAB — LIPID PANEL
CHOLESTEROL: 87 mg/dL (ref 0–200)
HDL: 44 mg/dL (ref >40)
LDL Cholesterol: 33 mg/dL (ref 0–99)
TRIGLYCERIDES: 48 mg/dL (ref <150)
Total CHOL/HDL Ratio: 2 RATIO
VLDL: 10 mg/dL (ref 0–40)

## 2018-05-01 LAB — CBG MONITORING, ED
GLUCOSE-CAPILLARY: 179 mg/dL — AB (ref 70–99)
Glucose-Capillary: 167 mg/dL — ABNORMAL HIGH (ref 70–99)

## 2018-05-01 LAB — TROPONIN I
Troponin I: 0.05 ng/mL (ref <0.03)
Troponin I: 0.06 ng/mL (ref <0.03)
Troponin I: 0.05 ng/mL (ref <0.03)

## 2018-05-01 LAB — MAGNESIUM: Magnesium: 1.9 mg/dL (ref 1.7–2.4)

## 2018-05-01 LAB — GLUCOSE, CAPILLARY
Glucose-Capillary: 184 mg/dL — ABNORMAL HIGH (ref 70–99)
Glucose-Capillary: 157 mg/dL — ABNORMAL HIGH (ref 70–99)

## 2018-05-01 LAB — HEMOGLOBIN A1C
HEMOGLOBIN A1C: 8.1 % — AB (ref 4.8–5.6)
MEAN PLASMA GLUCOSE: 185.77 mg/dL

## 2018-05-01 LAB — HIV ANTIBODY (ROUTINE TESTING W REFLEX): HIV SCREEN 4TH GENERATION: NONREACTIVE

## 2018-05-01 MED ORDER — METOLAZONE 2.5 MG PO TABS
2.5000 mg | ORAL_TABLET | Freq: Every day | ORAL | Status: DC
  Administered 2018-05-02 – 2018-05-04 (×3): 2.5 mg via ORAL
  Filled 2018-05-01 (×3): qty 1

## 2018-05-01 MED ORDER — IPRATROPIUM-ALBUTEROL 0.5-2.5 (3) MG/3ML IN SOLN
3.0000 mL | Freq: Three times a day (TID) | RESPIRATORY_TRACT | Status: DC
  Administered 2018-05-02 – 2018-05-06 (×14): 3 mL via RESPIRATORY_TRACT
  Filled 2018-05-01 (×13): qty 3

## 2018-05-01 MED ORDER — INSULIN ASPART 100 UNIT/ML ~~LOC~~ SOLN
0.0000 [IU] | Freq: Three times a day (TID) | SUBCUTANEOUS | Status: DC
  Administered 2018-05-01 – 2018-05-02 (×5): 3 [IU] via SUBCUTANEOUS
  Administered 2018-05-02 – 2018-05-03 (×3): 5 [IU] via SUBCUTANEOUS
  Administered 2018-05-03: 8 [IU] via SUBCUTANEOUS
  Administered 2018-05-04 (×3): 5 [IU] via SUBCUTANEOUS
  Administered 2018-05-05: 15 [IU] via SUBCUTANEOUS
  Administered 2018-05-05 (×2): 5 [IU] via SUBCUTANEOUS
  Filled 2018-05-01 (×2): qty 1

## 2018-05-01 MED ORDER — NICOTINE 7 MG/24HR TD PT24
7.0000 mg | MEDICATED_PATCH | Freq: Every day | TRANSDERMAL | Status: DC | PRN
  Filled 2018-05-01: qty 1

## 2018-05-01 MED ORDER — IPRATROPIUM-ALBUTEROL 0.5-2.5 (3) MG/3ML IN SOLN
3.0000 mL | Freq: Once | RESPIRATORY_TRACT | Status: DC
  Filled 2018-05-01: qty 3

## 2018-05-01 MED ORDER — OXYCODONE-ACETAMINOPHEN 5-325 MG PO TABS
1.0000 | ORAL_TABLET | Freq: Three times a day (TID) | ORAL | Status: DC | PRN
  Administered 2018-05-01 – 2018-05-03 (×7): 1 via ORAL
  Filled 2018-05-01 (×7): qty 1

## 2018-05-01 MED ORDER — INSULIN ASPART 100 UNIT/ML ~~LOC~~ SOLN
4.0000 [IU] | Freq: Three times a day (TID) | SUBCUTANEOUS | Status: DC
  Administered 2018-05-01 – 2018-05-06 (×17): 4 [IU] via SUBCUTANEOUS
  Filled 2018-05-01 (×2): qty 1

## 2018-05-01 MED ORDER — FUROSEMIDE 10 MG/ML IJ SOLN
80.0000 mg | Freq: Two times a day (BID) | INTRAMUSCULAR | Status: DC
  Administered 2018-05-01 – 2018-05-03 (×5): 80 mg via INTRAVENOUS
  Filled 2018-05-01 (×5): qty 8

## 2018-05-01 MED ORDER — INSULIN ASPART 100 UNIT/ML ~~LOC~~ SOLN
0.0000 [IU] | Freq: Every day | SUBCUTANEOUS | Status: DC
  Administered 2018-05-02: 3 [IU] via SUBCUTANEOUS
  Administered 2018-05-03: 4 [IU] via SUBCUTANEOUS
  Administered 2018-05-04: 2 [IU] via SUBCUTANEOUS

## 2018-05-01 NOTE — Progress Notes (Signed)
Paged MD re: ABG results.  Waiting on MD to call back.  Pt is awake and alert, denies SOB currenty, VSS currently.  Pt states he does not feel that he "needs bipap" currently.

## 2018-05-01 NOTE — ED Notes (Signed)
CBG 168.

## 2018-05-01 NOTE — ED Notes (Addendum)
Paged MD regarding need for admit order v. transfer order for tele bed per bed placement

## 2018-05-01 NOTE — ED Notes (Signed)
Pt resting comfortably; O2 sats upper 90's on hi-flow O2

## 2018-05-01 NOTE — ED Notes (Signed)
Spoke w/ Dr. Yisroel Ramming regarding pt's bed placement to SD rather than Tele and the need for new admit order rather than transfer order per bed placement

## 2018-05-01 NOTE — ED Notes (Signed)
DO at bedside

## 2018-05-01 NOTE — ED Notes (Signed)
Pt taken off of bipap and placed on 6L via [place]; RT aware

## 2018-05-01 NOTE — ED Notes (Signed)
Decreased O2 from 6L to 4L via [place]; will continue to monitor

## 2018-05-01 NOTE — Progress Notes (Signed)
RN made aware of panic CO2. I also spoke with EDP who stated [facility] was now following him. Went back to room to suggest putting patient back on BiPAP, but he is currently eating. Will check back in a bit later. SAT 91% on [facility]
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
## 2018-05-01 NOTE — ED Notes (Addendum)
Pt's O2 sats observed to be int he upper 80's to 90 on 6L w/ increased work of breathing; respiratory at bedside, MD paged; pt placed on hi-flow O2

## 2018-05-01 NOTE — ED Notes (Signed)
Spoke w/ bed placement regarding need for MD order for admit order v transfer order for tele bed; will notify MD

## 2018-05-01 NOTE — ED Notes (Signed)
Spoke w/ MD regarding pt's respiratory status

## 2018-05-01 NOTE — Progress Notes (Signed)
Spoke w/ pharmacy re: Memory Dance inhaler.  Per pharmacy, med will be dispensed once pt is admitted to floor.

## 2018-05-01 NOTE — Progress Notes (Signed)
RN called d/t taking pt off bipap for trial.  MD at bedside and states to hold off obtaining ABG for an hour while pt off bipap.  RN in room and aware.  No distress currently noted, VSS.   Pt denies SOB currently.

## 2018-05-01 NOTE — ED Notes (Signed)
O2 sats dropped to 89, 90%; O2 increased to 6L; will continue to monitor
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
## 2018-05-01 NOTE — Progress Notes (Signed)
Came to room to clarify ABG order as pt already had an ABG at 0850 w/ normalized pH.  Pt is currently awake, denies SOB more than normal baseline, VSS, pt is awake and answering questions.  RN calling MD to clarify other orders and also to clarify repeat ABG order.  ABG held for now until clarified.

## 2018-05-01 NOTE — Progress Notes (Signed)
Spoke w/ MD re: ABG order. No new RT orders received at this time, pt remains on Roy s/p discussion w/ MD.

## 2018-05-01 NOTE — Progress Notes (Addendum)
Report received from Legrand Como, RN in ED. Patient arrived to room at this time. V/S assessed. Telemetry applied and CCMD notified. CHG bath given. Assessment done. Patient oriented to room and how to call the nurse with any needs.   Emelda Fear, RN

## 2018-05-01 NOTE — Discharge Instructions (Signed)
Information on my medicine - XARELTO (Rivaroxaban)  This medication education was reviewed with me or my healthcare representative as part of my discharge preparation.  The pharmacist that spoke with me during my hospital stay was:  Saundra Shelling, [facility]  Why was Xarelto prescribed for you? Xarelto was prescribed for you to reduce the risk of a blood clot forming that can cause a stroke if you have a medical condition called atrial fibrillation (a type of irregular heartbeat).  What do you need to know about xarelto ? Take your Xarelto ONCE DAILY at the same time every day with your evening meal. If you have difficulty swallowing the tablet whole, you may crush it and mix in applesauce just prior to taking your dose.  Take Xarelto exactly as prescribed by your doctor and DO NOT stop taking Xarelto without talking to the doctor who prescribed the medication.  Stopping without other stroke prevention medication to take the place of Xarelto may increase your risk of developing a clot that causes a stroke.  Refill your prescription before you run out.  After discharge, you should have regular check-up appointments with your healthcare provider that is prescribing your Xarelto.  In the future your dose may need to be changed if your kidney function or weight changes by a significant amount.  What do you do if you miss a dose? If you are taking Xarelto ONCE DAILY and you miss a dose, take it as soon as you remember on the same day then continue your regularly scheduled once daily regimen the next day. Do not take two doses of Xarelto at the same time or on the same day.   Important Safety Information A possible side effect of Xarelto is bleeding. You should call your healthcare provider right away if you experience any of the following: ? Bleeding from an injury or your nose that does not stop. ? Unusual colored urine (red or dark brown) or unusual colored stools (red or black). ? Unusual  bruising for unknown reasons. ? A serious fall or if you hit your head (even if there is no bleeding).  Some medicines may interact with Xarelto and might increase your risk of bleeding while on Xarelto. To help avoid this, consult your healthcare provider or pharmacist prior to using any new prescription or non-prescription medications, including herbals, vitamins, non-steroidal anti-inflammatory drugs (NSAIDs) and supplements.  This website has more information on Xarelto: [email or web address]

## 2018-05-01 NOTE — ED Notes (Signed)
Spoke with Service response > Breakfast tray was ordered and will arrive at 0900

## 2018-05-01 NOTE — ED Notes (Signed)
Pt asking about pain medicine; offered pt PRN tylenol, pt states "no, that stuff don't work"; pt asking about another Percocet;This RN explained to pt that the percocet is scheduled every 8 hours PRN, pt received last does at 0736 this AM, so he will not be able to receive another Percocet until 1530; pt verbalized understanding

## 2018-05-02 ENCOUNTER — Inpatient Hospital Stay (HOSPITAL_COMMUNITY): Payer: Medicare Other

## 2018-05-02 LAB — BASIC METABOLIC PANEL
ANION GAP: 12 (ref 5–15)
BUN: 18 mg/dL (ref 8–23)
CHLORIDE: 90 mmol/L — AB (ref 98–111)
CO2: 41 mmol/L — ABNORMAL HIGH (ref 22–32)
Calcium: 8.5 mg/dL — ABNORMAL LOW (ref 8.9–10.3)
Creatinine, Ser: 0.88 mg/dL (ref 0.61–1.24)
GFR calc Af Amer: >60 mL/min (ref >60)
GFR calc non Af Amer: >60 mL/min (ref >60)
GLUCOSE: 201 mg/dL — AB (ref 70–99)
POTASSIUM: 3.5 mmol/L (ref 3.5–5.1)
Sodium: 143 mmol/L (ref 135–145)

## 2018-05-02 LAB — GLUCOSE, CAPILLARY
GLUCOSE-CAPILLARY: 181 mg/dL — AB (ref 70–99)
GLUCOSE-CAPILLARY: 251 mg/dL — AB (ref 70–99)
Glucose-Capillary: 195 mg/dL — ABNORMAL HIGH (ref 70–99)
Glucose-Capillary: 232 mg/dL — ABNORMAL HIGH (ref 70–99)

## 2018-05-02 LAB — CBC WITH DIFFERENTIAL/PLATELET
Abs Immature Granulocytes: 0.1 10*3/uL (ref 0.0–0.1)
BASOS ABS: 0 10*3/uL (ref 0.0–0.1)
Basophils Relative: 0 %
EOS ABS: 0 10*3/uL (ref 0.0–0.7)
EOS PCT: 0 %
HCT: 34 % — ABNORMAL LOW (ref 39.0–52.0)
Hemoglobin: 9.8 g/dL — ABNORMAL LOW (ref 13.0–17.0)
Immature Granulocytes: 1 %
Lymphocytes Relative: 9 %
Lymphs Abs: 0.6 10*3/uL — ABNORMAL LOW (ref 0.7–4.0)
MCH: 24.9 pg — AB (ref 26.0–34.0)
MCHC: 28.8 g/dL — AB (ref 30.0–36.0)
MCV: 86.5 fL (ref 78.0–100.0)
Monocytes Absolute: 0.2 10*3/uL (ref 0.1–1.0)
Monocytes Relative: 3 %
Neutro Abs: 6 10*3/uL (ref 1.7–7.7)
Neutrophils Relative %: 87 %
Platelets: 320 10*3/uL (ref 150–400)
RBC: 3.93 MIL/uL — AB (ref 4.22–5.81)
RDW: 16.5 % — ABNORMAL HIGH (ref 11.5–15.5)
WBC: 6.9 10*3/uL (ref 4.0–10.5)

## 2018-05-02 LAB — TROPONIN I
TROPONIN I: 0.07 ng/mL — AB (ref <0.03)
TROPONIN I: 0.07 ng/mL — AB (ref <0.03)
Troponin I: 0.07 ng/mL (ref <0.03)

## 2018-05-02 MED ORDER — POTASSIUM CHLORIDE CRYS ER 20 MEQ PO TBCR
40.0000 meq | EXTENDED_RELEASE_TABLET | Freq: Once | ORAL | Status: AC
  Administered 2018-05-02: 40 meq via ORAL
  Filled 2018-05-02: qty 2

## 2018-05-02 NOTE — Progress Notes (Signed)
OT Cancellation Note  Patient Details Name: Nicholas Caldwell MRN: [id number] DOB: [birth year]   Cancelled Treatment:    Reason Eval/Treat Not Completed: Fatigue/lethargy limiting ability to participate.  Pt sleeping soundly, and unable to adequately arouse despite max cues.  Will reattempt.  [place], OTR/L [phone]   Lucille Passy M 05/02/2018, 2:53 PM
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
## 2018-05-02 NOTE — Evaluation (Signed)
Physical Therapy Evaluation Patient Details Name: Nicholas Caldwell MRN: [id number] DOB: 12-08-1945 Today's Date: 05/02/2018   History of Present Illness  Pt is a 72 y.o. male presenting with 3 days of progressive shortness of breath secondary to a mixed copd and chf.  PMH significant for Afib, DM and obesity.  Clinical Impression  Patient presents with dependencies in gait and mobility, mostly limited by cardiopulmonary status and generalized weakness.  Overall, feel once cardiopulmonary status improves, patient can physically manage at home with intermittent help which wife can provide.  Patient adamant he would not go to SNF.  Patient also with lots of questions about how to manage his disease.  Patient will benefit from PT to progress mobility and independence for discharge  Home.      Follow Up Recommendations Home health PT;Supervision - Intermittent    Equipment Recommendations  None recommended by PT    Recommendations for Other Services       Precautions / Restrictions Precautions Precautions: Fall Restrictions Weight Bearing Restrictions: No      Mobility  Bed Mobility Overal bed mobility: Modified Independent             General bed mobility comments: used railing and rocked to get to edge of bed  Transfers Overall transfer level: Needs assistance Equipment used: Rolling walker (2 wheeled) Transfers: Sit to/from Stand Sit to Stand: Min guard         General transfer comment: used railing and RW to get to standing  Ambulation/Gait Ambulation/Gait assistance: Min Web designer (Feet): 60 Feet Assistive device: Rolling walker (2 wheeled) Gait Pattern/deviations: Step-to pattern Gait velocity: decreased   General Gait Details: Patient required 2 rest breaks during gait; upon return to room O2 sats = 83.  Recovered to 90 within 1 minute.    Stairs            Wheelchair Mobility    Modified Rankin (Stroke Patients Only)       Balance  Overall balance assessment: Needs assistance Sitting-balance support: No upper extremity supported;Feet supported Sitting balance-Leahy Scale: Good     Standing balance support: Bilateral upper extremity supported;During functional activity Standing balance-Leahy Scale: Poor Standing balance comment: reliant on RW for balance                             Pertinent Vitals/Pain Pain Assessment: 0-10 Pain Score: 3  Pain Location: right hip Pain Descriptors / Indicators: Aching;Dull Pain Intervention(s): Limited activity within patient's tolerance;Monitored during session    Home Living Family/patient expects to be discharged to:: Private residence Living Arrangements: Spouse/significant other Available Help at Discharge: Family;Available 24 hours/day Type of Home: House Home Access: Stairs to enter Entrance Stairs-Rails: None Entrance Stairs-Number of Steps: 1 Home Layout: One level Home Equipment: Walker - 2 wheels;Bedside commode;Cane - single point Additional Comments: on 3L of O2 at all times at home    Prior Function Level of Independence: Needs assistance   Gait / Transfers Assistance Needed: uses RW or cane   ADL's / Homemaking Assistance Needed: assist from wife for meals, housework        Hand Dominance        Extremity/Trunk Assessment   Upper Extremity Assessment Upper Extremity Assessment: Defer to OT evaluation    Lower Extremity Assessment Lower Extremity Assessment: Generalized weakness    Cervical / Trunk Assessment Cervical / Trunk Assessment: Kyphotic  Communication   Communication: No difficulties  Cognition Arousal/Alertness:  Awake/alert Behavior During Therapy: WFL for tasks assessed/performed Overall Cognitive Status: Within Functional Limits for tasks assessed                                        General Comments      Exercises General Exercises - Lower Extremity Ankle Circles/Pumps: AROM;Both;10 reps    Assessment/Plan    PT Assessment Patient needs continued PT services  PT Problem List Decreased activity tolerance;Decreased balance;Decreased mobility;Cardiopulmonary status limiting activity       PT Treatment Interventions DME instruction;Gait training;Functional mobility training;Balance training;Therapeutic activities;Patient/family education    PT Goals (Current goals can be found in the Care Plan section)  Acute Rehab PT Goals Patient Stated Goal: go home in 1-2 days PT Goal Formulation: With patient Time For Goal Achievement: 05/08/18 Potential to Achieve Goals: Good    Frequency Min 3X/week   Barriers to discharge        Co-evaluation               AM-PAC PT "6 Clicks" Daily Activity  Outcome Measure Difficulty turning over in bed (including adjusting bedclothes, sheets and blankets)?: A Little Difficulty moving from lying on back to sitting on the side of the bed? : A Lot Difficulty sitting down on and standing up from a chair with arms (e.g., wheelchair, bedside commode, etc,.)?: A Little Help needed moving to and from a bed to chair (including a wheelchair)?: A Little Help needed walking in hospital room?: A Little Help needed climbing 3-5 steps with a railing? : A Little 6 Click Score: 17    End of Session Equipment Utilized During Treatment: Gait belt Activity Tolerance: Patient tolerated treatment well Patient left: in bed;with call bell/phone within reach Nurse Communication: Mobility status PT Visit Diagnosis: Repeated falls (R29.6);Muscle weakness (generalized) (M62.81)    Time: 2197-5883 PT Time Calculation (min) (ACUTE ONLY): 42 min   Charges:   PT Evaluation $PT Eval Moderate Complexity: 1 Mod PT Treatments $Gait Training: 8-22 mins   PT G Codes:        05/07/2018 [facility], PT [phone]    Shanna Cisco 05/07/18, 10:23 AM

## 2018-05-03 LAB — TROPONIN I: Troponin I: 0.08 ng/mL (ref <0.03)

## 2018-05-03 LAB — CBC WITH DIFFERENTIAL/PLATELET
Abs Immature Granulocytes: 0 10*3/uL (ref 0.0–0.1)
BASOS PCT: 0 %
Basophils Absolute: 0 10*3/uL (ref 0.0–0.1)
EOS PCT: 0 %
Eosinophils Absolute: 0 10*3/uL (ref 0.0–0.7)
HEMATOCRIT: 35 % — AB (ref 39.0–52.0)
Hemoglobin: 10.4 g/dL — ABNORMAL LOW (ref 13.0–17.0)
IMMATURE GRANULOCYTES: 0 %
LYMPHS ABS: 0.6 10*3/uL — AB (ref 0.7–4.0)
Lymphocytes Relative: 8 %
MCH: 25.8 pg — ABNORMAL LOW (ref 26.0–34.0)
MCHC: 29.7 g/dL — ABNORMAL LOW (ref 30.0–36.0)
MCV: 86.8 fL (ref 78.0–100.0)
MONOS PCT: 4 %
Monocytes Absolute: 0.3 10*3/uL (ref 0.1–1.0)
NEUTROS PCT: 88 %
Neutro Abs: 6.6 10*3/uL (ref 1.7–7.7)
PLATELETS: 331 10*3/uL (ref 150–400)
RBC: 4.03 MIL/uL — ABNORMAL LOW (ref 4.22–5.81)
RDW: 16.4 % — AB (ref 11.5–15.5)
WBC: 7.5 10*3/uL (ref 4.0–10.5)

## 2018-05-03 LAB — BASIC METABOLIC PANEL
Anion gap: 14 (ref 5–15)
BUN: 24 mg/dL — AB (ref 8–23)
CALCIUM: 8.7 mg/dL — AB (ref 8.9–10.3)
CO2: 48 mmol/L — AB (ref 22–32)
Chloride: 80 mmol/L — ABNORMAL LOW (ref 98–111)
Creatinine, Ser: 1.01 mg/dL (ref 0.61–1.24)
GFR calc Af Amer: >60 mL/min (ref >60)
GLUCOSE: 244 mg/dL — AB (ref 70–99)
Potassium: 3.1 mmol/L — ABNORMAL LOW (ref 3.5–5.1)
Sodium: 142 mmol/L (ref 135–145)

## 2018-05-03 LAB — GLUCOSE, CAPILLARY
Glucose-Capillary: 227 mg/dL — ABNORMAL HIGH (ref 70–99)
Glucose-Capillary: 261 mg/dL — ABNORMAL HIGH (ref 70–99)
Glucose-Capillary: 217 mg/dL — ABNORMAL HIGH (ref 70–99)
Glucose-Capillary: 312 mg/dL — ABNORMAL HIGH (ref 70–99)

## 2018-05-03 LAB — MAGNESIUM: Magnesium: 1.9 mg/dL (ref 1.7–2.4)

## 2018-05-03 MED ORDER — OXYCODONE-ACETAMINOPHEN 5-325 MG PO TABS
2.0000 | ORAL_TABLET | Freq: Four times a day (QID) | ORAL | Status: DC | PRN
  Administered 2018-05-03 – 2018-05-06 (×12): 2 via ORAL
  Filled 2018-05-03 (×12): qty 2

## 2018-05-03 MED ORDER — TORSEMIDE 20 MG PO TABS: 40.0000 mg | ORAL_TABLET | Freq: Every day | ORAL | Status: DC

## 2018-05-03 MED ORDER — TORSEMIDE 20 MG PO TABS
40.0000 mg | ORAL_TABLET | Freq: Two times a day (BID) | ORAL | Status: DC
  Administered 2018-05-03 – 2018-05-04 (×2): 40 mg via ORAL
  Filled 2018-05-03 (×2): qty 2

## 2018-05-03 MED ORDER — PREDNISONE 20 MG PO TABS
40.0000 mg | ORAL_TABLET | Freq: Every day | ORAL | Status: DC
  Administered 2018-05-03 – 2018-05-05 (×3): 40 mg via ORAL
  Filled 2018-05-03 (×3): qty 2

## 2018-05-03 MED ORDER — POTASSIUM CHLORIDE CRYS ER 20 MEQ PO TBCR
20.0000 meq | EXTENDED_RELEASE_TABLET | ORAL | Status: AC
  Administered 2018-05-03 (×3): 20 meq via ORAL
  Filled 2018-05-03 (×3): qty 1

## 2018-05-03 NOTE — Discharge Summary (Signed)
[facility] Discharge Summary  Patient name: Nicholas Caldwell Medical record number: [id number] Date of birth: [birth year] Age: 72 y.o. Gender: male Date of Admission: 04/30/2018  Date of Discharge: 05/06/2018 Admitting Physician: Pinckney Bing, DO  Primary Care Provider: [facility] Consultants: Cardiology  Indication for Hospitalization: Shortness of breath  Discharge Diagnoses/Problem List:  Shortness of breath Congestive Heart failure CAD COPD Atrial Fibrillation Type II diabetes w/ neuropathy Morbid Obesity Tobacco dependence OSA HLD  Disposition: Home  Discharge Condition: Stable  Discharge Exam:   General: Alert and cooperative and appears to be in no acute distress.  Pt was standing at the side of his bed when I walked in and sat for our conversation. HEENT: Neck non-tender without lymphadenopathy, masses or thyromegaly Cardio: Normal A1 and S2, no S3 or S4. Rhythm is regular. No murmurs or rubs.   Pulm: Clear to auscultation bilaterally, no crackles, wheezing, or diminished breath sounds. Normal respiratory effort Abdomen: Bowel sounds normal. Abdomen soft and non-tender.  Extremities: No peripheral edema. Warm/ well perfused.  Strong radial. Neuro: Cranial nerves grossly intact   Brief Hospital Course:  Shortness of breath  COPD  CHF 72 year old male who presented on 6/29 with 3 days of progressive shortness of breath. His BNP was ~2000 and had diffuse pulmonary vascular congestion on chest xray. He also had profound wheezing in all lung bases on exam and swelling in BLE. Pt was admitted and treated for a mixture of CHF and COPD exacerbation. For his fluid overload, pt was diuresed 15.1 L to a final weight of 276 lbs and stabilized on the diuretic regimen below.  For his COPD, pt completed a 5 day course of steroids in addition to duonebs and breo. These therapies significantly improved his physical exam. On 7/2 pt was found to  have an elevated troponin (up to 0.26) and was evaluated by cardiology who recommended no further intervention.  On discharge, pt had a physical exam without LE edema, lungs clear auscultation bilaterally on his home level of oxygen (3L).  Pt was advised to have [facility] follow up [facility].   Issues for Follow Up:   1. Heart failure suggested and ACEI on discharge for mortality benefit. BP was low-normal pleases assess if BP would tolerate ACEI. 2. Recommend checking BMP at f/u to monitor Kidney function and K as Cr peaked at 1.6 was back to 1.1 at time of discharge. Pt discharged on 40 meq of K. 3. Consider adding Jardiance for diabetes control and mortality benefit. 4. Would benefit from CPAP at home  Significant Procedures: none  Significant Labs and Imaging:  Recent Labs  Lab 05/04/18 0252 05/05/18 0348 05/06/18 0419  WBC 10.7* 11.9* 11.6*  HGB 10.9* 11.1* 10.9*  HCT 36.9* 37.2* 37.3*  PLT 338 351 345   Recent Labs  Lab 04/30/18 2228 05/01/18 0510  05/03/18 0440 05/04/18 0252 05/04/18 1422 05/05/18 0348 05/05/18 1411 05/06/18 0419  NA  --  144   < > 142 138 137 136 135 140  K  --  4.4   < > 3.1* 2.8* 3.0* 2.9* 3.7 3.5  CL  --  97*   < > 80* 74* 73* 75* 76* 83*  CO2  --  37*   < > 48* 47* 49* 47* 45* 48*  GLUCOSE  --  158*   < > 244* 206* 248* 288* 351* 78  BUN  --  8   < > 24* 32*  42* 53* 53* 49*  CREATININE  --  0.89   < > 1.01 1.14 1.66* 1.49* 1.58* 1.13  CALCIUM  --  8.5*   < > 8.7* 8.5* 8.2* 7.8* 7.8* 8.3*  MG 2.0 1.9  --  1.9  --  1.9 2.1  --   --   PHOS  --  4.1  --   --   --   --   --   --   --   ALKPHOS  --  104  --   --   --   --   --   --   --   AST  --  16  --   --   --   --   --   --   --   ALT  --  12  --   --   --   --   --   --   --   ALBUMIN  --  2.9*  --   --   --   --   --   --   --    < > = values in this interval not displayed.    Results for orders placed or performed during the hospital encounter of 04/30/18  Blood culture (routine x 2)      Status: None   Collection Time: 04/30/18 10:30 PM  Result Value Ref Range Status   Specimen Description BLOOD RIGHT ANTECUBITAL  Final   Special Requests   Final    BOTTLES DRAWN AEROBIC AND ANAEROBIC Blood Culture results may not be optimal due to an excessive volume of blood received in culture bottles   Culture   Final    NO GROWTH 5 DAYS Performed at [facility] [address] [place], [place] [address]    Report Status 05/06/2018 FINAL  Final  Blood culture (routine x 2)     Status: None   Collection Time: 04/30/18 10:45 PM  Result Value Ref Range Status   Specimen Description BLOOD RIGHT THUMB  Final   Special Requests   Final    BOTTLES DRAWN AEROBIC AND ANAEROBIC Blood Culture results may not be optimal due to an inadequate volume of blood received in culture bottles   Culture   Final    NO GROWTH 5 DAYS Performed at [facility], [place] [address], [place], [place] [address]    Report Status 05/06/2018 FINAL  Final   Dg Chest 2 View  Result Date: 04/30/2018 CLINICAL DATA:  72 year old male with shortness of breath. EXAM: CHEST - 2 VIEW COMPARISON:  Chest radiograph dated 03/15/2018 FINDINGS: There is cardiomegaly with vascular congestion and edema. Small bilateral pleural effusions. Overall there has been interval worsening of the airspace densities compared to the prior radiograph. No pneumothorax. No acute osseous pathology. IMPRESSION: Cardiomegaly with findings of CHF, worsened since the study of 03/15/2018. Pneumonia is not excluded. Clinical correlation is recommended. Electronically Signed   By: Anner Crete M.D.   On: 04/30/2018 21:51   Dg Chest Port 1 View  Result Date: 05/02/2018 CLINICAL DATA:  Patient with history of congestive heart failure EXAM: PORTABLE CHEST 1 VIEW COMPARISON:  Chest radiograph 04/30/2018 FINDINGS: Monitoring leads overlie the patient. Stable cardiomegaly. Similar to mildly improved diffuse bilateral interstitial pulmonary  opacities. Retrocardiac consolidation may represent pleural effusion and atelectasis. IMPRESSION: Cardiomegaly and pulmonary edema, similar to mildly improved from prior. Probable left pleural effusion and underlying atelectasis. Electronically Signed   By: Dian Situ  Rosana Hoes M.D.   On: 05/02/2018 09:24     Results/Tests Pending at Time of Discharge: none  Discharge Medications:  Allergies as of 05/06/2018   No Known Allergies     Medication List    STOP taking these medications   metolazone 2.5 MG tablet Commonly known as:  ZAROXOLYN   predniSONE 10 MG tablet Commonly known as:  DELTASONE     TAKE these medications   albuterol 108 (90 Base) MCG/ACT inhaler Commonly known as:  PROVENTIL HFA;VENTOLIN HFA Inhale 2 puffs into the lungs every 6 (six) hours as needed for wheezing or shortness of breath.   budesonide-formoterol 160-4.5 MCG/ACT inhaler Commonly known as:  SYMBICORT Inhale 2 puffs into the lungs 2 (two) times daily.   carvedilol 12.5 MG tablet Commonly known as:  COREG Take 1 tablet (12.5 mg total) by mouth 2 (two) times daily with a meal.   diazepam 10 MG tablet Commonly known as:  VALIUM Take 0.5 tablets (5 mg total) by mouth daily as needed for anxiety or sleep. What changed:  how much to take   ferrous sulfate 325 (65 FE) MG tablet Take 325 mg by mouth 3 (three) times daily as needed (for supplementation).   fluticasone 50 MCG/ACT nasal spray Commonly known as:  FLONASE Place 2 sprays into both nostrils daily as needed for allergies.   folic acid 1 MG tablet Commonly known as:  FOLVITE Take 1 mg by mouth daily.   gabapentin 600 MG tablet Commonly known as:  NEURONTIN Take 1 tablet (600 mg total) by mouth 2 (two) times daily.   glucose 4 GM chewable tablet Chew 4 tablets by mouth See admin instructions. Chew 4 tablets by mouth as needed for low blood sugar - repeat every 15 minutes if blood sugar less than 70   guaifenesin 400 MG Tabs tablet Commonly known  as:  HUMIBID E Take 400 mg by mouth 3 (three) times daily.   hydrocortisone 1 % lotion Apply 1 application topically See admin instructions. Apply small amount to back twice daily for itchy rash   hydrocortisone 2.5 % rectal cream Commonly known as:  ANUSOL-HC Place 1 application rectally 2 (two) times daily as needed for hemorrhoids or itching. Use up to 2 weeks at a time as needed   ipratropium 0.02 % nebulizer solution Commonly known as:  ATROVENT Take 2.5 mLs (0.5 mg total) by nebulization every 6 (six) hours as needed for wheezing or shortness of breath.   ipratropium-albuterol 0.5-2.5 (3) MG/3ML Soln Commonly known as:  DUONEB Take 3 mLs by nebulization 2 (two) times daily.   latanoprost 0.005 % ophthalmic solution Commonly known as:  XALATAN Place 1 drop into both eyes at bedtime.   Magnesium Oxide 420 (252 Mg) MG Tabs Take 420 mg by mouth 2 (two) times daily.   metFORMIN 500 MG 24 hr tablet Commonly known as:  GLUCOPHAGE-XR Take 1,000 mg by mouth 2 (two) times daily with a meal.   NOVOLOG MIX 70/30 FLEXPEN (70-30) 100 UNIT/ML FlexPen Generic drug:  insulin aspart protamine - aspart Inject 30 Units into the skin 2 (two) times daily.   oxyCODONE-acetaminophen 10-325 MG tablet Commonly known as:  PERCOCET Take 1 tablet by mouth every 6 (six) hours as needed for pain.   OXYGEN Inhale 3 L into the lungs continuous.   potassium chloride SA 20 MEQ tablet Commonly known as:  K-DUR,KLOR-CON Take 2 tablets (40 mEq total) by mouth daily.   rivaroxaban 20 MG Tabs tablet Commonly known  as:  XARELTO Take 1 tablet (20 mg total) by mouth daily with supper. What changed:  when to take this   rosuvastatin 20 MG tablet Commonly known as:  CRESTOR Take 1 tablet (20 mg total) by mouth daily. Start taking on:  05/07/2018 What changed:    medication strength  additional instructions   spironolactone 25 MG tablet Commonly known as:  ALDACTONE Take 1 tablet (25 mg total) by  mouth at bedtime.   torsemide 20 MG tablet Commonly known as:  DEMADEX Take 2 tablets (40 mg total) by mouth 2 (two) times daily.       Discharge Instructions: Please refer to Patient Instructions section of EMR for full details.  Patient was counseled important signs and symptoms that should prompt return to medical care, changes in medications, dietary instructions, activity restrictions, and follow up appointments.   Follow-Up Appointments: Follow-up Information    Shirley Friar, PA-C Follow up on 05/19/2018.   Specialty:  Physician Assistant Why:  Heart Failure Follow up at 9:30 am.  Garage Code: 1400  Take all morning meds, bring all med bottles with you to appt. Please give 24 hr notice for cancellation/rescheduling of appt. Contact information: [address] [place] [address] [place], [place] Follow up.   Specialty:  Home Health Services Why:  [facility] services Contact information: [address] [place] [address] [phone]        [facility]. Schedule an appointment as soon as possible for a visit.   Contact information: [place] [address] [phone]           Matilde Haymaker, MD 05/06/2018, 1:10 PM PGY-1, [place]

## 2018-05-03 NOTE — Care Management Important Message (Signed)
Important Message  Patient Details  Name: Nicholas Caldwell MRN: [id number] Date of Birth: 1946/07/12   Medicare Important Message Given:  Yes    Alleta Avery P Makenize Messman 05/03/2018, 2:52 PM

## 2018-05-03 NOTE — Evaluation (Signed)
Occupational Therapy Evaluation Patient Details Name: Nicholas Caldwell MRN: [id number] DOB: 1945-11-08 Today's Date: 05/03/2018    History of Present Illness Pt is a 72 y.o. male presenting with 3 days of progressive shortness of breath secondary to a mixed copd and chf.  PMH significant for Afib, DM and obesity.   Clinical Impression   PTA, pt was living with his wife who assisted with LB ADLs and IADLs. Pt currently requiring Min A for LB ADLs with AE and Min Guard A for functional mobility with RW. Providing education on use of AE for LB dressing to optimize pt independence and energy conservation; pt demonstrating understanding. Pt demonstrating decreased activity tolerance as seen by SOB and decreased SpO2 during functional mobility; elevating supplemental O2 to 6L during functional mobility and returning pt to 4L at end of session. RN notified. Pt would benefit from further acute OT to facilitate safe dc. Recommend dc to home with HHOT for further OT to optimize safety, independence with ADLs, and return to PLOF.      Follow Up Recommendations  Home health OT;Supervision/Assistance - 24 hour    Equipment Recommendations  None recommended by OT    Recommendations for Other Services PT consult     Precautions / Restrictions Precautions Precautions: Fall Restrictions Weight Bearing Restrictions: No      Mobility Bed Mobility Overal bed mobility: Needs Assistance Bed Mobility: Supine to Sit;Sit to Supine     Supine to sit: Min assist Sit to supine: Min assist   General bed mobility comments: Min A to assist in pulling trunk into upright posture. Min A for managing LLE over EOB.   Transfers Overall transfer level: Needs assistance Equipment used: Rolling walker (2 wheeled) Transfers: Sit to/from Stand Sit to Stand: Min guard         General transfer comment: used railing and RW to get to standing    Balance Overall balance assessment: Needs assistance Sitting-balance  support: No upper extremity supported;Feet supported Sitting balance-Leahy Scale: Good     Standing balance support: Bilateral upper extremity supported;During functional activity Standing balance-Leahy Scale: Poor Standing balance comment: reliant on RW for balance                           ADL either performed or assessed with clinical judgement   ADL Overall ADL's : Needs assistance/impaired Eating/Feeding: Set up;Sitting   Grooming: Min guard;Standing   Upper Body Bathing: Set up;Supervision/ safety;Sitting   Lower Body Bathing: Minimal assistance;Sit to/from stand   Upper Body Dressing : Set up;Supervision/safety;Sitting Upper Body Dressing Details (indicate cue type and reason): Donned second gown like a jacket Lower Body Dressing: Min guard;With adaptive equipment;Sit to/from stand Lower Body Dressing Details (indicate cue type and reason): Educating pt on AE for LB ADLs. Pt demonstrating understanding and Doffing/doffing socks with AE. Min Guard A for safety in standing. Pt  Toilet Transfer: Ambulation;Min guard(Simulated in room)           Functional mobility during ADLs: Min guard;Rolling walker General ADL Comments: Pt reporting difficulty with LB ADLs and SOB with activity. Providing education on AE for LB dressing. Pt and wife very appreciative of education. Pt contionues to demosntrate decreased activity tolerance as seen by poor SPO2 during mobility. During funcitonal mobility, elevating O2 to 6L due to SOB and decreased SpO2. Returning pt to 3L at end of session and VCs for purse lip breathing     Vision Baseline Vision/History: Wears glasses  Wears Glasses: Reading only Patient Visual Report: No change from baseline       Perception     Praxis      Pertinent Vitals/Pain Pain Assessment: Faces Faces Pain Scale: Hurts little more Pain Location: right hip Pain Descriptors / Indicators: Aching;Dull Pain Intervention(s): Limited activity within  patient's tolerance;Monitored during session;Repositioned     Hand Dominance Right   Extremity/Trunk Assessment Upper Extremity Assessment Upper Extremity Assessment: Overall WFL for tasks assessed   Lower Extremity Assessment Lower Extremity Assessment: Defer to PT evaluation   Cervical / Trunk Assessment Cervical / Trunk Assessment: Kyphotic   Communication Communication Communication: No difficulties   Cognition Arousal/Alertness: Awake/alert Behavior During Therapy: WFL for tasks assessed/performed Overall Cognitive Status: Within Functional Limits for tasks assessed                                     General Comments  Wife present throughout session. SpO2 87%-92%.     Exercises     Shoulder Instructions      Home Living Family/patient expects to be discharged to:: Private residence Living Arrangements: Spouse/significant other Available Help at Discharge: Family;Available 24 hours/day Type of Home: House Home Access: Stairs to enter [employer] of Steps: 1 Entrance Stairs-Rails: None Home Layout: One level     Bathroom Shower/Tub: Teacher, early years/pre: Standard Bathroom Accessibility: Yes   Home Equipment: Environmental consultant - 2 wheels;Bedside commode;Cane - single point;Shower seat   Additional Comments: on 3L of O2 at all times at home      Prior Functioning/Environment Level of Independence: Needs assistance  Gait / Transfers Assistance Needed: uses RW or cane  ADL's / Homemaking Assistance Needed: assist from wife for LB ADLs, meals, housework            OT Problem List: Decreased strength;Decreased range of motion;Decreased activity tolerance;Impaired balance (sitting and/or standing);Decreased knowledge of use of DME or AE;Decreased knowledge of precautions;Cardiopulmonary status limiting activity;Pain      OT Treatment/Interventions: Self-care/ADL training;Therapeutic exercise;Energy conservation;DME and/or AE  instruction;Therapeutic activities;Patient/family education    OT Goals(Current goals can be found in the care plan section) Acute Rehab OT Goals Patient Stated Goal: go home in 1-2 days OT Goal Formulation: With patient Time For Goal Achievement: 05/17/18 Potential to Achieve Goals: Good ADL Goals Pt Will Perform Grooming: with modified independence;standing Pt Will Perform Lower Body Dressing: with modified independence;sit to/from stand;with adaptive equipment Pt Will Transfer to Toilet: with modified independence;ambulating;regular height toilet Pt Will Perform Toileting - Clothing Manipulation and hygiene: with modified independence;sit to/from stand Pt Will Perform Tub/Shower Transfer: Tub transfer;shower seat;rolling walker;with modified independence  OT Frequency: Min 3X/week   Barriers to D/C:            Co-evaluation              AM-PAC PT "6 Clicks" Daily Activity     Outcome Measure Help from another person eating meals?: None Help from another person taking care of personal grooming?: A Little Help from another person toileting, which includes using toliet, bedpan, or urinal?: A Little Help from another person bathing (including washing, rinsing, drying)?: A Lot Help from another person to put on and taking off regular upper body clothing?: A Little Help from another person to put on and taking off regular lower body clothing?: A Little 6 Click Score: 18   End of Session Equipment Utilized During Treatment:  Gait belt;Rolling walker;Oxygen(4L-6L) Nurse Communication: Mobility status  Activity Tolerance: Patient tolerated treatment well;Patient limited by fatigue Patient left: in bed;with call bell/phone within reach;with family/visitor present  OT Visit Diagnosis: Unsteadiness on feet (R26.81);Other abnormalities of gait and mobility (R26.89);Muscle weakness (generalized) (M62.81);Pain Pain - Right/Left: Right Pain - part of body: Hip                Time:  1519-1600 OT Time Calculation (min): 41 min Charges:  OT General Charges $OT Visit: 1 Visit OT Evaluation $OT Eval Moderate Complexity: 1 Mod OT Treatments $Self Care/Home Management : 23-37 mins G-Codes:     [place], OTR/L Acute Rehab Pager: [phone] Office: [place] 05/03/2018, 5:02 PM
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
## 2018-05-04 ENCOUNTER — Encounter (HOSPITAL_COMMUNITY): Payer: Medicare Other

## 2018-05-04 DIAGNOSIS — R748 Abnormal levels of other serum enzymes: Secondary | ICD-10-CM

## 2018-05-04 DIAGNOSIS — N179 Acute kidney failure, unspecified: Secondary | ICD-10-CM

## 2018-05-04 DIAGNOSIS — I5023 Acute on chronic systolic (congestive) heart failure: Secondary | ICD-10-CM

## 2018-05-04 LAB — TROPONIN I
TROPONIN I: 0.15 ng/mL — AB (ref <0.03)
Troponin I: 0.26 ng/mL (ref <0.03)

## 2018-05-04 LAB — CBC
HCT: 36.9 % — ABNORMAL LOW (ref 39.0–52.0)
HEMOGLOBIN: 10.9 g/dL — AB (ref 13.0–17.0)
MCH: 25.3 pg — AB (ref 26.0–34.0)
MCHC: 29.5 g/dL — ABNORMAL LOW (ref 30.0–36.0)
MCV: 85.8 fL (ref 78.0–100.0)
PLATELETS: 338 10*3/uL (ref 150–400)
RBC: 4.3 MIL/uL (ref 4.22–5.81)
RDW: 16.4 % — ABNORMAL HIGH (ref 11.5–15.5)
WBC: 10.7 10*3/uL — AB (ref 4.0–10.5)

## 2018-05-04 LAB — BASIC METABOLIC PANEL
ANION GAP: 17 — AB (ref 5–15)
Anion gap: 15 (ref 5–15)
BUN: 32 mg/dL — ABNORMAL HIGH (ref 8–23)
BUN: 42 mg/dL — AB (ref 8–23)
CALCIUM: 8.5 mg/dL — AB (ref 8.9–10.3)
CO2: 47 mmol/L — AB (ref 22–32)
CO2: 49 mmol/L — ABNORMAL HIGH (ref 22–32)
CREATININE: 1.14 mg/dL (ref 0.61–1.24)
CREATININE: 1.66 mg/dL — AB (ref 0.61–1.24)
Calcium: 8.2 mg/dL — ABNORMAL LOW (ref 8.9–10.3)
Chloride: 74 mmol/L — ABNORMAL LOW (ref 98–111)
Chloride: 73 mmol/L — ABNORMAL LOW (ref 98–111)
GFR calc Af Amer: 47 mL/min — ABNORMAL LOW (ref >60)
GFR, EST NON AFRICAN AMERICAN: 40 mL/min — AB (ref >60)
GLUCOSE: 248 mg/dL — AB (ref 70–99)
Glucose, Bld: 206 mg/dL — ABNORMAL HIGH (ref 70–99)
POTASSIUM: 3 mmol/L — AB (ref 3.5–5.1)
Potassium: 2.8 mmol/L — ABNORMAL LOW (ref 3.5–5.1)
SODIUM: 138 mmol/L (ref 135–145)
Sodium: 137 mmol/L (ref 135–145)

## 2018-05-04 LAB — GLUCOSE, CAPILLARY
GLUCOSE-CAPILLARY: 232 mg/dL — AB (ref 70–99)
Glucose-Capillary: 247 mg/dL — ABNORMAL HIGH (ref 70–99)
Glucose-Capillary: 222 mg/dL — ABNORMAL HIGH (ref 70–99)
Glucose-Capillary: 232 mg/dL — ABNORMAL HIGH (ref 70–99)

## 2018-05-04 LAB — MAGNESIUM: Magnesium: 1.9 mg/dL (ref 1.7–2.4)

## 2018-05-04 MED ORDER — TORSEMIDE 20 MG PO TABS: 40.0000 mg | ORAL_TABLET | Freq: Every day | ORAL | Status: DC

## 2018-05-04 MED ORDER — POTASSIUM CHLORIDE CRYS ER 20 MEQ PO TBCR
40.0000 meq | EXTENDED_RELEASE_TABLET | Freq: Once | ORAL | Status: AC
  Administered 2018-05-04: 40 meq via ORAL
  Filled 2018-05-04: qty 2

## 2018-05-04 MED ORDER — POTASSIUM CHLORIDE CRYS ER 20 MEQ PO TBCR
40.0000 meq | EXTENDED_RELEASE_TABLET | Freq: Three times a day (TID) | ORAL | Status: AC
  Administered 2018-05-04 (×3): 40 meq via ORAL
  Filled 2018-05-04 (×4): qty 2

## 2018-05-04 MED ORDER — POTASSIUM CHLORIDE CRYS ER 20 MEQ PO TBCR
40.0000 meq | EXTENDED_RELEASE_TABLET | Freq: Two times a day (BID) | ORAL | Status: DC
  Administered 2018-05-05 – 2018-05-06 (×3): 40 meq via ORAL
  Filled 2018-05-04 (×3): qty 2

## 2018-05-04 NOTE — Consult Note (Addendum)
Advanced Heart Failure Team Consult Note   Primary Physician: [facility] PCP-Cardiologist:  No primary care provider on file. HF Cardiologist: [facility]  Reason for Consultation: Elevated troponin  HPI:    Kobyn Kray is seen today for evaluation of elevated troponin at the request of Dr Erin Hearing.   Grayden Burley is a 72 y.o. male with a history of chronic systolic HF due to NICM (EF 40-45%), PAF on xarelto (required DCCV in past), COPD on home O2, former tobacco use, chronic hypoxic respiratory failure, hip arthritis, DM, morbid obesity, and hx of noncompliance.   Cardiac cath 1/16:  mLAD diffuse moderate nonobstructive disease, pLCx mild disease, dLCx with disease but too small for PCI, RCA nondiminant ok, EF 25-30%  Last seen in HF clinic 02/2018 for cardiac clearance for total hip arthoplasty. Unable to get weight in clinic due to inability to stand. Volume status felt to be OK. Weight from 03/15/18 was 268.   Presented to [facility] on 04/30[facility]18 with SOB. Thought due to a combination of CHF and COPD. BNP 1,912 (up from 866 in 5/19) and CXR suggestive of CHF.  WBC 9.8. ABG 6/29  7.37/72/60/87%  He was treated with solu-medrol and duonebs for COPD exacerbation. He was diuresed with IV lasix and daily metolazone. He has diuresed 18 lbs and has been transitioned to torsemide 40 mg daily + metolazone daily. Breathing much better. K 2.8 > 3.0 today. Creatinine 1.14 > 1.66 this afternoon. His wife reports that he has been drinking a lot of Diet Pepper at home.   Labs also notable for mildly elevated troponin   Consulted today for elevated troponin 0.07 > 0.07 > 0.08 > 0.15 > 0.26. He denies any CP or pressure. ECG on admit with minimal new lateral T wave changes which are nonspecific  He reports his dry weight is 278 lbs and he is 272 lbs today. SOB is much better. He is back on home O2 of 3 L. \  Echo 04/07/18 - Left ventricle: Diffuse hypokinesis worse in the inferior wall   with  abnormal septal motion The cavity size was mildly dilated.   Wall thickness was normal. Systolic function was mildly to   moderately reduced. The estimated ejection fraction was in the   range of 40% to 45%. Left ventricular diastolic function   parameters were normal. - Left atrium: The atrium was mildly dilated. - Right ventricle: The cavity size was mildly dilated. - Atrial septum: No defect or patent foramen ovale was identified. - Pulmonary arteries: PA peak pressure: 62 mm Hg (S).  Review of Systems: [y] = yes, _0  = no   General: Weight gain _1 ; Weight loss _2 ; Anorexia _3 ; Fatigue _4 ; Fever _5 ; Chills _6 ; Weakness _7   Cardiac: Chest pain/pressure _8 ; Resting SOB [ y]; Exertional SOB [ y]; Orthopnea _9 ; Pedal Edema Blue.Reese ]; Palpitations _10 ; Syncope _11 ; Presyncope _12 ; Paroxysmal nocturnal dyspnea_13   Pulmonary: Cough Blue.Reese ]; [facility] ]; Hemoptysis_14 ; Sputum _15 ; Snoring _16   GI: Vomiting_17 ; Dysphagia_18 ; Melena_19 ; Hematochezia _20 ; Heartburn_21 ; Abdominal pain _22 ; Constipation _23 ; Diarrhea _24 ; BRBPR _25   GU: Hematuria_26 ; Dysuria _27 ; Nocturia_28   Vascular: Pain in legs with walking _29 ; Pain in feet with lying flat _30 ; Non-healing sores _31 ; Stroke _32 ; TIA _33 ; Slurred speech _34 ;  Neuro: Headaches_35 ; Vertigo_36 ;  Seizures_0 ; Paresthesias_1 ;Blurred vision _2 ; Diplopia _3 ; Vision changes _4   Ortho/Skin: Arthritis Blue.Reese ]; Joint pain [ y]; Muscle pain _5 ; Joint swelling _6 ; Back Pain _7 ; Rash _8   Psych: Depression_9 ; Anxiety_10   Heme: Bleeding problems _11 ; Clotting disorders _12 ; Anemia Blue.Reese ]  Endocrine: Diabetes [ y]; Thyroid dysfunction_13   Home Medications Prior to Admission medications   Medication Sig Start Date End Date Taking? Authorizing Provider  albuterol (PROVENTIL HFA;VENTOLIN HFA) 108 (90 BASE) MCG/ACT inhaler Inhale 2 puffs into the lungs every 6 (six) hours as needed for wheezing or shortness of breath. 11/07/14  Yes Buriev, Arie Sabina, MD    budesonide-formoterol (SYMBICORT) 160-4.5 MCG/ACT inhaler Inhale 2 puffs into the lungs 2 (two) times daily.   Yes [provider]  carvedilol (COREG) 12.5 MG tablet Take 1 tablet (12.5 mg total) by mouth 2 (two) times daily with a meal. 11/05/16  Yes Regalado, Belkys A, MD  diazepam (VALIUM) 10 MG tablet Take 0.5 tablets (5 mg total) by mouth daily as needed for anxiety or sleep. Patient taking differently: Take 10 mg by mouth daily as needed for anxiety or sleep.  12/19/16  Yes Theodis Blaze, MD  fluticasone [facility]) 50 MCG/ACT nasal spray Place 2 sprays into both nostrils daily as needed for allergies.    Yes [provider]  folic acid (FOLVITE) 1 MG tablet Take 1 mg by mouth daily.   Yes [provider]  gabapentin (NEURONTIN) 600 MG tablet Take 1 tablet (600 mg total) by mouth 2 (two) times daily. 12/19/16  Yes Theodis Blaze, MD  glucose 4 GM chewable tablet Chew 4 tablets by mouth See admin instructions. Chew 4 tablets by mouth as needed for low blood sugar - repeat every 15 minutes if blood sugar less than 70   Yes [provider]  guaifenesin (HUMIBID E) 400 MG TABS tablet Take 400 mg by mouth 3 (three) times daily.   Yes [provider]  hydrocortisone (ANUSOL-HC) 2.5 % rectal cream Place 1 application rectally 2 (two) times daily as needed for hemorrhoids or itching. Use up to 2 weeks at a time as needed   Yes [provider]  hydrocortisone 1 % lotion Apply 1 application topically See admin instructions. Apply small amount to back twice daily for itchy rash   Yes [provider]  insulin aspart protamine - aspart (NOVOLOG MIX 70/30 FLEXPEN) (70-30) 100 UNIT/ML FlexPen Inject 30 Units into the skin 2 (two) times daily.    Yes [provider]  ipratropium (ATROVENT) 0.02 % nebulizer solution Take 2.5 mLs (0.5 mg total) by nebulization every 6 (six) hours as needed for wheezing or shortness of breath. 12/19/16  Yes Theodis Blaze, MD  ipratropium-albuterol (DUONEB) 0.5-2.5 (3) MG/3ML SOLN Take 3 mLs by nebulization 2 (two) times daily.   Yes [provider]  latanoprost (XALATAN) 0.005 % ophthalmic solution Place 1 drop into both eyes at bedtime.   Yes [provider]  Magnesium Oxide 420 (252 Mg) MG TABS Take 420 mg by mouth 2 (two) times daily.   Yes [provider]  metFORMIN (GLUCOPHAGE-XR) 500 MG 24 hr tablet Take 1,000 mg by mouth 2 (two) times daily with a meal.   Yes [provider]  oxyCODONE-acetaminophen (PERCOCET) 10-325 MG tablet Take 1 tablet by mouth every 6 (six) hours as needed for pain.   Yes [provider]  OXYGEN Inhale 3  L into the lungs continuous.    Yes [provider]  potassium chloride SA (K-DUR,KLOR-CON) 20 MEQ tablet Take 2 tablets (40 mEq total) by mouth daily. 11/07/14  Yes Buriev, Arie Sabina, MD  predniSONE (DELTASONE) 10 MG tablet Take 2 tablets (20 mg total) by mouth 2 (two) times daily with a meal. 03/16/18  Yes Delo, Nathaneil Canary, MD  rivaroxaban (XARELTO) 20 MG TABS tablet Take 1 tablet (20 mg total) by mouth daily with supper. Patient taking differently: Take 20 mg by mouth daily at 6 PM.  01/20/17  Yes Jettie Booze, MD  rosuvastatin (CRESTOR) 40 MG tablet Take 0.5 tablets (20 mg total) by mouth daily. For cholesterol 01/07/17  Yes Shirley Friar, PA-C  torsemide (DEMADEX) 20 MG tablet Take 2 tablets (40 mg total) by mouth 2 (two) times daily. 08/06/17  Yes Shirley Friar, PA-C  ferrous sulfate 325 (65 FE) MG tablet Take 325 mg by mouth 3 (three) times daily as needed (for supplementation).    [provider]  metolazone (ZAROXOLYN) 2.5 MG tablet Take 1 tablet (2.5 mg total) by mouth as directed. Patient not taking: Reported on 05/01/2018 08/06/17   Shirley Friar, PA-C  spironolactone (ALDACTONE) 25 MG tablet Take 1 tablet (25 mg total) by mouth at bedtime. Patient not taking: Reported on  05/01/2018 02/04/18 02/04/19  Shirley Friar, PA-C    Past Medical History: Past Medical History:  Diagnosis Date  . Atrial flutter ([place])    a. recurrent AFlutter with RVR;  b. Amiodarone Rx started 4/16  . CAD (coronary artery disease)    a. LHC 1/16:  mLAD diffuse disease, pLCx mild disease, dLCx with disease but too small for PCI, RCA ok, EF 25-30%  . Chronic pain   . Chronic systolic CHF (congestive heart failure) ([place])   . COPD (chronic obstructive pulmonary disease) ([place])   . Diabetes mellitus without complication ([place])   . Hypercholesteremia   . Hypertension   . NICM (nonischemic cardiomyopathy) ([place])    a.dx 2016. b. 2D echo 06/2016 - Last echo 07/01/16: mod dilated LV, mod LVH, EF 25-30%, mild-mod MR, sev LAE, mild-mod reduced RV systolic function, mild-mod TR, PASP 80mHG.  .Marland KitchenPAF (paroxysmal atrial fibrillation) ([facility])    On amio - ot a candidate for flecainide due to cardiomyopathy, not a candidate for Tikosyn due to prolonged QT, and felt to be a poor candidate for ablation given left atrial size.  . Prolonged Q-T interval on ECG   . [place]monary hypertension ([place]   . Tobacco abuse     Past Surgical History: Past Surgical History:  Procedure Laterality Date  . LEFT AND RIGHT HEART CATHETERIZATION WITH CORONARY ANGIOGRAM N/A 11/06/2014   Procedure: LEFT AND RIGHT HEART CATHETERIZATION WITH CORONARY ANGIOGRAM;  Surgeon: JJettie Booze MD;  [facility];  Service: Cardiovascular;  Laterality: N/A;  . SPLENECTOMY      Family History: Family History  Problem Relation Age of Onset  . Heart disease Mother   . Hypertension Mother   . Heart failure Mother   . Heart disease Father   . Hypertension Sister   . Heart attack Neg Hx   . Stroke Neg Hx     Social History: Social History   Socioeconomic History  . Marital status: Married    Spouse name: Not on file  . Number of children: Not on file  . Years of education: Not on file  . Highest education  level: Not on file  Occupational History  . Not on file  Social Needs  . Financial resource strain: Not on file  . Food insecurity:    Worry: Not on file    Inability: Not on file  . Transportation needs:    Medical: Not on file    Non-medical: Not on file  Tobacco Use  . Smoking status: Current Every Day Smoker    Packs/day: 1.00    Years: 34.00    Pack years: 34.00    Types: Cigarettes  . Smokeless tobacco: Never Used  Substance and Sexual Activity  . Alcohol use: No    Alcohol/week: 0.0 oz  . Drug use: No  . Sexual activity: Not on file  Lifestyle  . Physical activity:    Days per week: Not on file    Minutes per session: Not on file  . Stress: Not on file  Relationships  . Social connections:    Talks on phone: Not on file    Gets together: Not on file    Attends religious service: Not on file    Active member of club or organization: Not on file    Attends meetings of clubs or organizations: Not on file    Relationship status: Not on file  Other Topics Concern  . Not on file  Social History Narrative  . Not on file    Allergies:  No Known Allergies  Objective:    Vital Signs:   Temp:  [98.4 F (36.9 C)-98.9 F (37.2 C)] 98.5 F (36.9 C) (07/02 1129) Pulse Rate:  [53-77] 74 (07/02 1129) Resp:  [18-26] 18 (07/02 1129) BP: (100-137)/(55-78) 100/56 (07/02 1129) SpO2:  [90 %-97 %] 94 % (07/02 1342) Weight:  [272 lb 1.6 oz (123.4 kg)] 272 lb 1.6 oz (123.4 kg) (07/02 0416) Last BM Date: 05/02/18  Weight change: Filed Weights   05/02/18 0644 05/03/18 0127 05/04/18 0416  Weight: 290 lb 2 oz (131.6 kg) 277 lb 6.4 oz (125.8 kg) 272 lb 1.6 oz (123.4 kg)    Intake/Output:   Intake/Output Summary (Last 24 hours) at 05/04/2018 1403 Last data filed at 05/04/2018 0734 Gross per 24 hour  Intake 1072 ml  Output 5850 ml  Net -4778 ml      Physical Exam    General:  Obese. No resp difficulty HEENT: normal Neck: supple. JVP difficult, but does not appear  elevated. Carotids 2+ bilat; no bruits. No lymphadenopathy or thyromegaly appreciated. Cor: PMI nondisplaced. Regular rate & rhythm. No rubs, gallops or murmurs. Lungs: clear On 3 L [place] Abdomen: soft, nontender, nondistended. No hepatosplenomegaly. No bruits or masses. Good bowel sounds. Extremities: no cyanosis, clubbing, rash, edema Neuro: alert & orientedx3, cranial nerves grossly intact. moves all 4 extremities w/o difficulty. Affect pleasant   Telemetry   NSR 70s with PACs. Personally reviewed.   EKG    NSR 76 bpm with PACs. Personally reviewed.   Labs   Basic Metabolic Panel: Recent Labs  Lab 04/30/18 2049 04/30/18 2228 05/01/18 0510 05/02/18 0426 05/03/18 0440 05/04/18 0252  NA 144  --  144 143 142 138  K 4.8  --  4.4 3.5 3.1* 2.8*  CL 99  --  97* 90* 80* 74*  CO2 36*  --  37* 41* 48* 47*  GLUCOSE 134*  --  158* 201* 244* 206*  BUN 9  --  8 18 24* 32*  CREATININE 0.93  --  0.89 0.88 1.01 1.14  CALCIUM 8.6*  --  8.5* 8.5* 8.7* 8.5*  MG  --  2.0 1.9  --  1.9  --   PHOS  --   --  4.1  --   --   --     Liver Function Tests: Recent Labs  Lab 05/01/18 0510  AST 16  ALT 12  ALKPHOS 104  BILITOT 0.5  PROT 7.1  ALBUMIN 2.9*   No results for input(s): LIPASE, AMYLASE in the last 168 hours. No results for input(s): AMMONIA in the last 168 hours.  CBC: Recent Labs  Lab 04/30/18 2049 05/01/18 0510 05/02/18 0426 05/03/18 0440 05/04/18 0252  WBC 9.8 6.4 6.9 7.5 10.7*  NEUTROABS  --  5.7 6.0 6.6  --   HGB 10.2* 10.2* 9.8* 10.4* 10.9*  HCT 37.4* 36.8* 34.0* 35.0* 36.9*  MCV 93.5 91.3 86.5 86.8 85.8  PLT 296 279 320 331 338    Cardiac Enzymes: Recent Labs  Lab 05/02/18 0916 05/02/18 1558 05/02/18 2212 05/04/18 0252 05/04/18 1138  TROPONINI 0.07* 0.07* 0.08* 0.15* 0.26*    BNP: BNP (last 3 results) Recent Labs    03/15/18 2356 04/30/18 2049  BNP 866.2* 1,912.5*    ProBNP (last 3 results) No results for input(s): PROBNP in the last 8760  hours.   CBG: Recent Labs  Lab 05/03/18 1146 05/03/18 1613 05/03/18 2055 05/04/18 0609 05/04/18 1128  GLUCAP 261* 217* 312* 232* 247*    Coagulation Studies: No results for input(s): LABPROT, INR in the last 72 hours.   Imaging    No results found.   Medications:     Current Medications: . aspirin EC  81 mg Oral Daily  . carvedilol  12.5 mg Oral BID WC  . fluticasone furoate-vilanterol  1 puff Inhalation Daily  . gabapentin  600 mg Oral BID  . insulin aspart  0-15 Units Subcutaneous TID WC  . insulin aspart  0-5 Units Subcutaneous QHS  . insulin aspart  4 Units Subcutaneous TID WC  . ipratropium-albuterol  3 mL Nebulization TID  . lisinopril  10 mg Oral Daily  . metolazone  2.5 mg Oral Daily  . potassium chloride  40 mEq Oral TID  . predniSONE  40 mg Oral Q breakfast  . rivaroxaban  20 mg Oral Q supper  . rosuvastatin  20 mg Oral Daily  . spironolactone  25 mg Oral QHS  . [START ON 05/05/2018] torsemide  40 mg Oral Daily     Infusions:     Patient Profile   Jarad Barth is a 72 y.o. male with a history of chronic systolic HF due to NICM, PAF on xarelto (required DCCV in past), COPD on home O2, former tobacco use, chronic hypoxic respiratory failure, hip arthritis, DM, morbid obesity, and hx of noncompliance.   Admitted with SOB.   Assessment/Plan   1. Elevated troponin - LHC 2016 showed mild nonobstructive CAD - Troponin 0.07 > 0.07 > 0.08 > 0.15 > 0.26 - EKG looks okay. - Continue ASA/statin.  - No s/s ischemia. I do not think his troponin elevated is related to ACS.   2. Acute on chronic systolic CHF. NICM. LHC in 2016 showed mild nonobstructive CAD.  - Echo 04/07/18: EF 40-45%, LA mildly dilated, RV mildly dilated, PA peak pressure 62 mmHg - Volume status stable to dry on exam. Weight is 6 lbs below his dry weight.  - Afternoon creatinine up to 1.66. Hold torsemide. Stop daily metolazone.  - Continue spiro 25 mg daily.  - Continue coreg 12.5 mg  BID - Continue lisinopril  10 mg daily  3. Paroxysmal Afib - Continue Xarelto 20 mg daily - Has previously failed amioadrone and poor tikosyn candidate with long QT. Now following with EP for possible long QT syndrome.  - Poor ablation candidate with LA size  4. COPD exacerbation - Back to baseline 3 L O2 - Dr. Melvyn Novas follows outpatient.  - Encouraged smoking cessation - Now on daily prednisone  5. Acute on hronic hypoxic respiratory failure - Improved with diuresis - Continue chronic O2. Back on 3 L O2 - PFTs with restrictive lung disease.    6. Hypokalemia - K 2.8. Supped with 80 meq. BMET recheck scheduled for 16:00  7. AKI  - Creatinine 1.1 this am > 1.66 this afternoon.  - Hold torsemide  Medication concerns reviewed with patient and pharmacy team. Barriers identified: none  Length of Stay: [place], NP  05/04/2018, 2:03 PM  Advanced Heart Failure Team Pager [phone] (M-F; 7a - 4p)  Please contact [facility] for night-coverage after hours (4p -7a ) and weekends on [email or web address]  Patient seen and examined with the above-signed Advanced Practice Provider and/or Housestaff. I personally reviewed laboratory data, imaging studies and relevant notes. I independently examined the patient and formulated the important aspects of the plan. I have edited the note to reflect any of my changes or salient points. I have personally discussed the plan with the patient and/or family.  72 y/o with obesity, severe COPD with chronic CO2 retention, nonobstructive CAD, DM2 and chronic systolic HF due to NICM admitted with respiratory failure and mildly elevated troponin. Work-up mostly c/w acute HF flare +/- mild COPD component. Now much improved with diuresis. Given elevated creatinine likely a bit overdiuresed. Agree with holding diuretics today and if creatinine stable or decreasing can go home tomorrow. Counseled on need to watch dietary intake as well as smoking cessation. . Will supp  K+.   Troponin minimally elevated and flat. Suspect likely due to HF +/- mild demand ischemia. Cath in 1/16 with nonobstructive CAD. ECG with minimal nonspecific T wave changes. Doubt ACS, Continue medical management. With h/o HF, CAD and DM2 would consider Jardiance as outpatient.   Glori Bickers, MD  6:18 PM

## 2018-05-04 NOTE — Progress Notes (Signed)
Inpatient Diabetes Program Recommendations  AACE/ADA: New Consensus Statement on Inpatient Glycemic Control (2015)  Target Ranges:  Prepandial:   less than 140 mg/dL      Peak postprandial:   less than 180 mg/dL (1-2 hours)      Critically ill patients:  140 - 180 mg/dL   Lab Results  Component Value Date   GLUCAP 247 (H) 05/04/2018   HGBA1C 8.1 (H) 05/01/2018    Review of Glycemic Control  Diabetes history: DM2 Outpatient Diabetes medications: Novolog 70/30 30 units bid, metformin 1000 mg bid Current orders for Inpatient glycemic control: Novolog 0-15 units tidwc and hs + 4 units tidwc.  Inpatient Diabetes Program Recommendations:     Add Lantus 20 units QHS  Continue to follow.   Thank you. Lorenda Peck, RD, LDN, CDE Inpatient Diabetes Coordinator [phone]

## 2018-05-04 NOTE — Progress Notes (Signed)
Occupational Therapy Treatment Patient Details Name: Nicholas Caldwell MRN: [id number] DOB: Jul 22, 1946 Today's Date: 05/04/2018    History of present illness Pt is a 71 y.o. male presenting with 3 days of progressive shortness of breath secondary to a mixed copd and chf.  PMH significant for Afib, DM and obesity.   OT comments  Pt require encouragement to participate.Pt'sdauhgte reports that he is sedentary at home.Pt ambulated to bathroom for toileting and shower transfers;stood at sink for grooming  Follow Up Recommendations  Home health OT;Supervision/Assistance - 24 hour    Equipment Recommendations  None recommended by OT    Recommendations for Other Services      Precautions / Restrictions Precautions Precautions: Fall Restrictions Weight Bearing Restrictions: No       Mobility Bed Mobility               General bed mobility comments: pt up in recliner  Transfers Overall transfer level: Needs assistance Equipment used: Rolling walker (2 wheeled) Transfers: Sit to/from Stand Sit to Stand: Min guard              Balance Overall balance assessment: Needs assistance Sitting-balance support: No upper extremity supported;Feet supported Sitting balance-Leahy Scale: Good     Standing balance support: Bilateral upper extremity supported;During functional activity Standing balance-Leahy Scale: Poor                             ADL either performed or assessed with clinical judgement   ADL Overall ADL's : Needs assistance/impaired     Grooming: Min guard;Standing;Wash/dry face;Wash/dry Teacher, music: Ambulation;Min guard;RW;Grab Environmental education officer and Hygiene: Min guard;Sit to/from stand   Tub/ Shower Transfer: Min guard;Ambulation;Rolling walker;3 in 1;Grab bars   Functional mobility during ADLs: Min guard;Rolling walker General ADL Comments: no c/o SOB during ADL activity      Vision Baseline Vision/History: Wears glasses Wears Glasses: Reading only Patient Visual Report: No change from baseline     Perception     Praxis      Cognition Arousal/Alertness: Awake/alert Behavior During Therapy: WFL for tasks assessed/performed Overall Cognitive Status: Within Functional Limits for tasks assessed                                          Exercises     Shoulder Instructions       General Comments      Pertinent Vitals/ Pain       Pain Score: 3  Pain Location: right hip Pain Descriptors / Indicators: Aching;Sore Pain Intervention(s): Monitored during session;Repositioned;Limited activity within patient's tolerance  Home Living                                          Prior Functioning/Environment              Frequency  Min 3X/week        Progress Toward Goals  OT Goals(current goals can now be found in the care plan section)  Progress towards OT goals: Progressing toward goals     Plan Discharge plan remains appropriate    Co-evaluation  AM-PAC PT "6 Clicks" Daily Activity     Outcome Measure   Help from another person eating meals?: None Help from another person taking care of personal grooming?: A Little Help from another person toileting, which includes using toliet, bedpan, or urinal?: A Little Help from another person bathing (including washing, rinsing, drying)?: A Lot Help from another person to put on and taking off regular upper body clothing?: A Little Help from another person to put on and taking off regular lower body clothing?: A Little 6 Click Score: 18    End of Session Equipment Utilized During Treatment: Gait belt;Rolling walker;Oxygen;Other (comment)(3 in 1)  OT Visit Diagnosis: Unsteadiness on feet (R26.81);Other abnormalities of gait and mobility (R26.89);Muscle weakness (generalized) (M62.81);Pain Pain - Right/Left: Right Pain - part of  body: Hip   Activity Tolerance Patient tolerated treatment well;Patient limited by fatigue   Patient Left in bed;with call bell/phone within reach;with family/visitor present   Nurse Communication      Functional Assessment Tool Used: AM-PAC 6 Clicks Daily Activity   Time: 3159-4585 OT Time Calculation (min): 17 min  Charges: OT G-codes **NOT FOR INPATIENT CLASS** Functional Assessment Tool Used: AM-PAC 6 Clicks Daily Activity OT General Charges $OT Visit: 1 Visit OT Treatments $Self Care/Home Management : 8-22 mins     Britt Bottom 05/04/2018, 1:31 PM
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
## 2018-05-04 NOTE — Progress Notes (Signed)
Physical Therapy Treatment Patient Details Name: Nicholas Caldwell MRN: [id number] DOB: 1946-01-14 Today's Date: 05/04/2018    History of Present Illness Pt is a 72 y.o. male presenting with 3 days of progressive shortness of breath secondary to a mixed copd and chf.  PMH significant for Afib, DM and obesity.    PT Comments    Pt more limited today by his R hip pain (reports bad arthritis and needing to get it replaced) than his breathing abilities (no audible DOE with gait on 3 L O2 [place]).  He was able to go significantly further down the hallway compared to last session, but needed multiple standing and one seated rest break due to R hip pain and fatigue.  I told him he will need to find a balance between being more active and being so active that his R hip is too painful.  Maybe this is something the home therapist can work on with him when he discharges.  PT will continue to follow acutely.  Bring rollator next session to ensure that he can sit if he needs to during gait (there just happened to be one nearby today).     Follow Up Recommendations  Home health PT;Supervision - Intermittent     Equipment Recommendations  None recommended by PT    Recommendations for Other Services   NA     Precautions / Restrictions Precautions Precautions: Fall Precaution Comments: right hip with significant OA, needs replacement Restrictions Weight Bearing Restrictions: No    Mobility  Bed Mobility Overal bed mobility: Needs Assistance Bed Mobility: Supine to Sit     Supine to sit: Min assist;HOB elevated     General bed mobility comments: Min hand held assist to pull to sitting.  HOB ~30 degrees.   Transfers Overall transfer level: Needs assistance Equipment used: Rolling walker (2 wheeled) Transfers: Sit to/from Stand Sit to Stand: Min assist;Min guard;From elevated surface         General transfer comment: Min guard assist from elevated bed, min assist from lower rollator seat.    Ambulation/Gait Ambulation/Gait assistance: Min guard Gait Distance (Feet): 160 Feet Assistive device: Rolling walker (2 wheeled) Gait Pattern/deviations: Step-through pattern;Trunk flexed;Antalgic     General Gait Details: Pt with moderately antalgic gait pattern, favoring his right hip.  Trunk flexed most of the time, cues for upright posture and to stay inside of the RW.  ~6 standing rest breaks due to hip pain and one seated rest break as we got closer to returning to his room.        Balance Overall balance assessment: Needs assistance Sitting-balance support: No upper extremity supported Sitting balance-Leahy Scale: Good     Standing balance support: Bilateral upper extremity supported Standing balance-Leahy Scale: Poor Standing balance comment: reliant on RW for balance                            Cognition Arousal/Alertness: Awake/alert Behavior During Therapy: WFL for tasks assessed/performed Overall Cognitive Status: Within Functional Limits for tasks assessed                                               Pertinent Vitals/Pain Pain Assessment: Faces Pain Score: 3  Faces Pain Scale: Hurts whole lot Pain Location: right hip Pain Descriptors / Indicators: Grimacing;Guarding Pain Intervention(s): Limited activity within  patient's tolerance;Monitored during session;Repositioned           PT Goals (current goals can now be found in the care plan section) Acute Rehab PT Goals Patient Stated Goal: go home in 1-2 days Progress towards PT goals: Progressing toward goals    Frequency    Min 3X/week      PT Plan Current plan remains appropriate       AM-PAC PT "6 Clicks" Daily Activity  Outcome Measure  Difficulty turning over in bed (including adjusting bedclothes, sheets and blankets)?: A Little Difficulty moving from lying on back to sitting on the side of the bed? : A Little Difficulty sitting down on and standing up  from a chair with arms (e.g., wheelchair, bedside commode, etc,.)?: A Little Help needed moving to and from a bed to chair (including a wheelchair)?: A Little Help needed walking in hospital room?: A Little Help needed climbing 3-5 steps with a railing? : A Little 6 Click Score: 18    End of Session Equipment Utilized During Treatment: Oxygen Activity Tolerance: Patient limited by fatigue;Patient limited by pain Patient left: in bed;with family/visitor present;Other (comment)(seated EOB, wife in room) Nurse Communication: Mobility status PT Visit Diagnosis: Repeated falls (R29.6);Muscle weakness (generalized) (M62.81)     Time: 8592-9244 PT Time Calculation (min) (ACUTE ONLY): 33 min  Charges:  $Gait Training: 23-37 mins          Etan Vasudevan B. [place], [place], DPT [phone]            05/04/2018, 3:26 PM

## 2018-05-05 DIAGNOSIS — I509 Heart failure, unspecified: Secondary | ICD-10-CM

## 2018-05-05 LAB — BASIC METABOLIC PANEL
ANION GAP: 14 (ref 5–15)
Anion gap: 14 (ref 5–15)
BUN: 53 mg/dL — AB (ref 8–23)
BUN: 53 mg/dL — ABNORMAL HIGH (ref 8–23)
CALCIUM: 7.8 mg/dL — AB (ref 8.9–10.3)
CHLORIDE: 75 mmol/L — AB (ref 98–111)
CO2: 47 mmol/L — ABNORMAL HIGH (ref 22–32)
CO2: 45 mmol/L — ABNORMAL HIGH (ref 22–32)
CREATININE: 1.49 mg/dL — AB (ref 0.61–1.24)
Calcium: 7.8 mg/dL — ABNORMAL LOW (ref 8.9–10.3)
Chloride: 76 mmol/L — ABNORMAL LOW (ref 98–111)
Creatinine, Ser: 1.58 mg/dL — ABNORMAL HIGH (ref 0.61–1.24)
GFR calc non Af Amer: 43 mL/min — ABNORMAL LOW (ref >60)
GFR, EST AFRICAN AMERICAN: 53 mL/min — AB (ref >60)
GFR, EST AFRICAN AMERICAN: 49 mL/min — AB (ref >60)
GFR, EST NON AFRICAN AMERICAN: 46 mL/min — AB (ref >60)
Glucose, Bld: 288 mg/dL — ABNORMAL HIGH (ref 70–99)
Glucose, Bld: 351 mg/dL — ABNORMAL HIGH (ref 70–99)
POTASSIUM: 2.9 mmol/L — AB (ref 3.5–5.1)
POTASSIUM: 3.7 mmol/L (ref 3.5–5.1)
SODIUM: 135 mmol/L (ref 135–145)
Sodium: 136 mmol/L (ref 135–145)

## 2018-05-05 LAB — CBC
HEMATOCRIT: 37.2 % — AB (ref 39.0–52.0)
HEMOGLOBIN: 11.1 g/dL — AB (ref 13.0–17.0)
MCH: 25.5 pg — ABNORMAL LOW (ref 26.0–34.0)
MCHC: 29.8 g/dL — ABNORMAL LOW (ref 30.0–36.0)
MCV: 85.3 fL (ref 78.0–100.0)
Platelets: 351 10*3/uL (ref 150–400)
RBC: 4.36 MIL/uL (ref 4.22–5.81)
RDW: 16.5 % — ABNORMAL HIGH (ref 11.5–15.5)
WBC: 11.9 10*3/uL — AB (ref 4.0–10.5)

## 2018-05-05 LAB — GLUCOSE, CAPILLARY
GLUCOSE-CAPILLARY: 216 mg/dL — AB (ref 70–99)
GLUCOSE-CAPILLARY: 205 mg/dL — AB (ref 70–99)
GLUCOSE-CAPILLARY: 367 mg/dL — AB (ref 70–99)
Glucose-Capillary: 425 mg/dL — ABNORMAL HIGH (ref 70–99)

## 2018-05-05 LAB — MAGNESIUM: Magnesium: 2.1 mg/dL (ref 1.7–2.4)

## 2018-05-05 MED ORDER — INSULIN ASPART 100 UNIT/ML ~~LOC~~ SOLN: 0.0000 [IU] | Freq: Three times a day (TID) | SUBCUTANEOUS | Status: DC

## 2018-05-05 MED ORDER — INSULIN ASPART 100 UNIT/ML ~~LOC~~ SOLN
0.0000 [IU] | Freq: Three times a day (TID) | SUBCUTANEOUS | Status: DC
  Administered 2018-05-05: 20 [IU] via SUBCUTANEOUS
  Administered 2018-05-06: 4 [IU] via SUBCUTANEOUS

## 2018-05-05 MED ORDER — INSULIN GLARGINE 100 UNIT/ML ~~LOC~~ SOLN
10.0000 [IU] | Freq: Every day | SUBCUTANEOUS | Status: DC
  Administered 2018-05-05: 10 [IU] via SUBCUTANEOUS
  Filled 2018-05-05 (×2): qty 0.1

## 2018-05-05 MED ORDER — POTASSIUM CHLORIDE CRYS ER 20 MEQ PO TBCR
40.0000 meq | EXTENDED_RELEASE_TABLET | ORAL | Status: AC
  Administered 2018-05-05 (×2): 40 meq via ORAL
  Filled 2018-05-05 (×2): qty 2

## 2018-05-05 MED ORDER — TORSEMIDE 20 MG PO TABS
40.0000 mg | ORAL_TABLET | Freq: Two times a day (BID) | ORAL | Status: DC
  Administered 2018-05-06: 40 mg via ORAL
  Filled 2018-05-05: qty 2

## 2018-05-05 NOTE — Care Management Note (Signed)
Case Management Note Marvetta Gibbons RN, BSN Unit 4E-Case Manager [phone]  Patient Details  Name: [name] MRN: [id number] Date of Birth: 06-19-1946  Subjective/Objective:   Pt admitted with COPD and CHF                 Action/Plan: PTA pt lived at home with wife, pt has home 02 baseline at 2L with [facility]. Per PT/OT evals recommendations for [facility] therapies- will need orders for RN/PT/OT/SW. Spoke with pt and wife at bedside- per conversation pt has been with [facility] in past- list provided for choice pt would like to use someone different this time and have chosen [facility] for services. Pt has PCP at [facility] clinic- Dr. Mulles-([facility])- Education officer, museum at [place] is [employer]. Will need d/c summary faxed to [place] at [phone]. Call made to [facility] with [facility]- for [facility] referral- pending orders for [facility] needs. CM to follow for transition of care.    Expected Discharge Date:  05/04/18               Expected Discharge Plan:  [place]  In-House Referral:  Clinical Social Work  Discharge planning Services  CM Consult  Post Acute Care Choice:  Home Health Choice offered to:  Patient  DME Arranged:    DME Agency:     HH Arranged:    [place] Agency:     Status of Service:  In process, will continue to follow  If discussed at Long Length of Stay Meetings, dates discussed:    Discharge Disposition: home/home health   Additional Comments:  Dawayne Patricia, RN 05/05/2018, 2:53 PM

## 2018-05-05 NOTE — Progress Notes (Signed)
Physical Therapy Treatment Patient Details Name: Nicholas Caldwell MRN: [id number] DOB: 1946/04/02 Today's Date: 05/05/2018    History of Present Illness Pt is a 72 y.o. male presenting with 3 days of progressive shortness of breath secondary to a mixed copd and chf.  PMH significant for Afib, DM and obesity.    PT Comments    Utilized rollator for today's treatment. Pt with good safety awareness and use of breaks when turning to sit with rollator. Pt tolerated increased ambulation distance today with frequent seated rest brakes secondary to drop in SpO2. Pt with good awareness of when SpO2 drops, though requires cues for pursed lipped breathing to return O2 sats. O2 increased to 6L during ambulation as pts sats were dropping quickly and having trouble with recovery. Patient would benefit from continued skilled PT to increase activity tolerance and functional independnece. Will continue to follow acutely.      Follow Up Recommendations  Home health PT;Supervision - Intermittent     Equipment Recommendations  None recommended by PT    Recommendations for Other Services       Precautions / Restrictions Precautions Precautions: Fall Precaution Comments: right hip with significant OA, needs replacement Restrictions Weight Bearing Restrictions: No    Mobility  Bed Mobility Overal bed mobility: Needs Assistance Bed Mobility: Supine to Sit;Sit to Supine     Supine to sit: Min guard Sit to supine: Min assist   General bed mobility comments: Assist required to progress LE onto bed after ambulation.  Transfers Overall transfer level: Needs assistance Equipment used: Rolling walker (2 wheeled) Transfers: Sit to/from Stand Sit to Stand: Min guard         General transfer comment: Min Guard A for safety  Ambulation/Gait Ambulation/Gait assistance: Counsellor (Feet): 350 Feet Assistive device: 4-wheeled walker Gait Pattern/deviations: Step-through pattern;Trunk  flexed;Antalgic Gait velocity: decreased   General Gait Details: Cues for upright posture, pt unable to correct secondary to LBP. Several seated rest brakes secondary to fatigue and dizziness. SpO2 dropped throughout ambulation reaching as low as 81%. Cues for pursed lipped breathing and O2 increased to 6L as pt was frequently desatting with ambulation and unable to recover with pursed lipped breathing alone.   Stairs             Wheelchair Mobility    Modified Rankin (Stroke Patients Only)       Balance Overall balance assessment: Needs assistance Sitting-balance support: No upper extremity supported Sitting balance-Leahy Scale: Good     Standing balance support: Bilateral upper extremity supported Standing balance-Leahy Scale: Poor Standing balance comment: reliant on RW for balance                            Cognition Arousal/Alertness: Awake/alert Behavior During Therapy: WFL for tasks assessed/performed Overall Cognitive Status: Within Functional Limits for tasks assessed                                        Exercises Other Exercises Other Exercises: Neck ROM due to reported neck pain. Pt reporting he is unable to extention. Facilitating theraputic massage for tension release during ROM strengthes. Pt reporting and demonstrating increased neck ROM.     General Comments General comments (skin integrity, edema, etc.): Wife present      Pertinent Vitals/Pain Pain Assessment: Faces Faces Pain Scale: Hurts even more Pain  Location: right hip, Low back Pain Descriptors / Indicators: Grimacing;Guarding Pain Intervention(s): Monitored during session;Limited activity within patient's tolerance    Home Living                      Prior Function            PT Goals (current goals can now be found in the care plan section) Acute Rehab PT Goals Patient Stated Goal: go home in 1-2 days PT Goal Formulation: With patient Time  For Goal Achievement: 05/16/18 Potential to Achieve Goals: Good Progress towards PT goals: Progressing toward goals    Frequency    Min 3X/week      PT Plan Current plan remains appropriate    Co-evaluation              AM-PAC PT "6 Clicks" Daily Activity  Outcome Measure  Difficulty turning over in bed (including adjusting bedclothes, sheets and blankets)?: A Little Difficulty moving from lying on back to sitting on the side of the bed? : A Little Difficulty sitting down on and standing up from a chair with arms (e.g., wheelchair, bedside commode, etc,.)?: A Little Help needed moving to and from a bed to chair (including a wheelchair)?: A Little Help needed walking in hospital room?: A Little Help needed climbing 3-5 steps with a railing? : A Little 6 Click Score: 18    End of Session Equipment Utilized During Treatment: Oxygen;Gait belt Activity Tolerance: Patient tolerated treatment well;Other (comment)(O2 Sats dropping) Patient left: in bed;with family/visitor present;Other (comment)(wife in room) Nurse Communication: Mobility status PT Visit Diagnosis: Repeated falls (R29.6);Muscle weakness (generalized) (M62.81)     Time: 1017-5102 PT Time Calculation (min) (ACUTE ONLY): 39 min  Charges:  $Gait Training: 38-52 mins                    G Codes:       Benjiman Core, [place] Pager [phone] Acute Rehab  Allena Katz 05/05/2018, 1:51 PM
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
## 2018-05-05 NOTE — Progress Notes (Addendum)
Advanced Heart Failure Rounding Note  PCP-Cardiologist: No primary care provider on file.   Subjective:    Creatinine 1.66 > 1.49. K 2.9 this am. He received torsemide and metolazone yesterday morning. Holding today.  WBC 11.9. On steroids. Afebrile. Weight unchanged.   Denies CP or dizziness with standing. He is SOB with walking in hallways, which is his baseline. No orthopnea.   Objective:   Weight Range: 272 lb (123.4 kg) Body mass index is 36.89 kg/m.   Vital Signs:   Temp:  [97.6 F (36.4 C)-98.5 F (36.9 C)] 98.3 F (36.8 C) (07/03 0751) Pulse Rate:  [51-74] 67 (07/03 0751) Resp:  [14-19] 16 (07/03 0751) BP: (91-122)/(56-79) 122/70 (07/03 0751) SpO2:  [92 %-100 %] 100 % (07/03 0857) Weight:  [272 lb (123.4 kg)] 272 lb (123.4 kg) (07/03 0402) Last BM Date: 05/03/18  Weight change: Filed Weights   05/03/18 0127 05/04/18 0416 05/05/18 0402  Weight: 277 lb 6.4 oz (125.8 kg) 272 lb 1.6 oz (123.4 kg) 272 lb (123.4 kg)    Intake/Output:   Intake/Output Summary (Last 24 hours) at 05/05/2018 0938 Last data filed at 05/05/2018 0725 Gross per 24 hour  Intake 586 ml  Output 1950 ml  Net -1364 ml      Physical Exam    General:  Well appearing. No resp difficulty HEENT: Normal Neck: Supple. JVP difficult, but does not appear elevated. Carotids 2+ bilat; no bruits. No lymphadenopathy or thyromegaly appreciated. Cor: PMI nondisplaced. Regular rate & rhythm. No rubs, gallops or murmurs. Lungs: wheezing, cleared with coughing Abdomen: Soft, nontender, nondistended. No hepatosplenomegaly. No bruits or masses. Good bowel sounds. Extremities: No cyanosis, clubbing, rash, edema Neuro: Alert & orientedx3, cranial nerves grossly intact. moves all 4 extremities w/o difficulty. Affect pleasant   Telemetry   NSR 60-70. Personally reviewed.   EKG    NSR 63 bpm with PACs. Personally reviewed.   Labs    CBC Recent Labs    05/03/18 0440 05/04/18 0252 05/05/18 0348    WBC 7.5 10.7* 11.9*  NEUTROABS 6.6  --   --   HGB 10.4* 10.9* 11.1*  HCT 35.0* 36.9* 37.2*  MCV 86.8 85.8 85.3  PLT 331 338 884   Basic Metabolic Panel Recent Labs    05/04/18 1422 05/05/18 0348  NA 137 136  K 3.0* 2.9*  CL 73* 75*  CO2 49* 47*  GLUCOSE 248* 288*  BUN 42* 53*  CREATININE 1.66* 1.49*  CALCIUM 8.2* 7.8*  MG 1.9 2.1   Liver Function Tests No results for input(s): AST, ALT, ALKPHOS, BILITOT, PROT, ALBUMIN in the last 72 hours. No results for input(s): LIPASE, AMYLASE in the last 72 hours. Cardiac Enzymes Recent Labs    05/02/18 2212 05/04/18 0252 05/04/18 1138  TROPONINI 0.08* 0.15* 0.26*    BNP: BNP (last 3 results) Recent Labs    03/15/18 2356 04/30/18 2049  BNP 866.2* 1,912.5*    ProBNP (last 3 results) No results for input(s): PROBNP in the last 8760 hours.   D-Dimer No results for input(s): DDIMER in the last 72 hours. Hemoglobin A1C No results for input(s): HGBA1C in the last 72 hours. Fasting Lipid Panel No results for input(s): CHOL, HDL, LDLCALC, TRIG, CHOLHDL, LDLDIRECT in the last 72 hours. Thyroid Function Tests No results for input(s): TSH, T4TOTAL, T3FREE, THYROIDAB in the last 72 hours.  Invalid input(s): FREET3  Other results:   Imaging     No results found.   Medications:  Scheduled Medications: . aspirin EC  81 mg Oral Daily  . carvedilol  12.5 mg Oral BID WC  . fluticasone furoate-vilanterol  1 puff Inhalation Daily  . gabapentin  600 mg Oral BID  . insulin aspart  0-15 Units Subcutaneous TID WC  . insulin aspart  0-5 Units Subcutaneous QHS  . insulin aspart  4 Units Subcutaneous TID WC  . ipratropium-albuterol  3 mL Nebulization TID  . lisinopril  10 mg Oral Daily  . potassium chloride  40 mEq Oral BID  . potassium chloride  40 mEq Oral Q3H  . predniSONE  40 mg Oral Q breakfast  . rivaroxaban  20 mg Oral Q supper  . rosuvastatin  20 mg Oral Daily  . spironolactone  25 mg Oral QHS      Infusions:   PRN Medications:  diclofenac sodium, nicotine, ondansetron **OR** ondansetron (ZOFRAN) IV, oxyCODONE-acetaminophen    Patient Profile   Nicholas Caldwell is a 72 y.o. male with a history of chronic systolic HF due to NICM (EF 40-45%), PAF on xarelto (required DCCV in past), COPD on home O2, former tobacco use, chronic hypoxic respiratory failure, hip arthritis, DM, morbid obesity, and hx of noncompliance.   Admitted with SOB.   Assessment/Plan   1. Elevated troponin - LHC 2016 showed mild nonobstructive CAD - Troponin 0.07 > 0.07 > 0.08 > 0.15 > 0.26 - EKG looks okay. - Continue statin. Does not need ASA with xarelto.  - No s/s ischemia. I do not think his troponin elevated is related to ACS.   2. Acute on chronic systolic CHF. NICM. LHC in 2016 showed mild nonobstructive CAD.  - Echo 04/07/18: EF 40-45%, LA mildly dilated, RV mildly dilated, PA peak pressure 62 mmHg - Volume status stable to dry on exam. Weight is 6 lbs below his dry weight.  - Creatinine trending back down. Hold torsemide today. Okay to restart tomorrow.  -Continue spiro 25 mg daily. - Continue coreg 12.5 mg BID - Continue lisinopril 10 mg daily  3. Paroxysmal Afib - Continue Xarelto 20 mg daily - Has previously failed amioadrone and poor tikosyn candidate with long QT. Now following with EP for possible long QT syndrome.  - Poor ablation candidate with LA size - Remains in NSR  4. COPD exacerbation - Back to baseline 3 L O2 - Dr. Melvyn Novas follows outpatient.  - Encouraged smoking cessation - Now on daily prednisone. WBC trending up 11.9. Afebrile.   5. Acute on hronic hypoxic respiratory failure -Improved with diuresis - Continue chronic O2.Back on 3 L O2. No change.  -PFTs with restrictive lung disease.   6. Hypokalemia - K 2.9 this am. Receiving supp. Check this afternoon  7. AKI  - Creatinine 1.66 > 1.49 - Hold torsemide today.   8. Hip arthritis - Pt is hoping to  schedule hip arthroplasty once cleared by [place] and his PCP. Echo was repeated, with results as above. They plan to most likely use Spinal Anaesthesia.  - Surgical risk is moderate with 6.6% chance of MI, pulmonary edema, VF, cardiac arrest, or complete heart block according to [facility] Criteria.   Addendum: Heart failure team will sign off as of 05/05/18  HF Medication Recommendations for Home: Coreg 12.5 mg BID Torsemide 40 mg BID starting tomorrow Lisinopril 10 mg daily Xarelto 20 mg daily Crestor 20 mg daily Spiro 25 mg daily  Other recommendations (Labs,testing, etc): BMET at follow up.   Follow up as an outpatient: 05/19/18 at 9:30 am HF  clinic   Length of Stay: [place], NP  05/05/2018, 9:38 AM  Advanced Heart Failure Team Pager [phone] (M-F; 7a - 4p)  Please contact [facility] for night-coverage after hours (4p -7a ) and weekends on [email or web address]  Patient seen and examined with the above-signed Advanced Practice Provider and/or Housestaff. I personally reviewed laboratory data, imaging studies and relevant notes. I independently examined the patient and formulated the important aspects of the plan. I have edited the note to reflect any of my changes or salient points. I have personally discussed the plan with the patient and/or family.  He is improved today with renal function improved and K supped up to 3.7. When I walked into his room this evening he was sitting in his chair eating with 8 cups/continuers of various fluids on his tray + large pitched of ice. We had long talk about need for fluid restriction. He made numerous excuses and said he only drinks a few sips from each. However, when I asked his nurse about it she told us that he will not let them remove cups from his tray. He has very limited insight into the severity of his heart and lung disease. We spent close to 40 mins trying to educate him on the importance of better compliance.   He is stable for discharge  from our standpoint (d/w FTPS personally). We will arrange f//u in HF Clinic. Would likely benefit from Paramedicine following.   Glori Bickers, MD  7:02 PM

## 2018-05-05 NOTE — Progress Notes (Signed)
Occupational Therapy Treatment Patient Details Name: Nicholas Caldwell MRN: [id number] DOB: 1946-05-05 Today's Date: 05/05/2018    History of present illness Pt is a 72 y.o. male presenting with 3 days of progressive shortness of breath secondary to a mixed copd and chf.  PMH significant for Afib, DM and obesity.   OT comments  Pt progressing towards established OT goals. Pt performing UB dressing with set up while seated. Providing education on safe tub transfer with RW and shower seat. Pt performing simulated tub transfer with Min Guard A for safety. Pt verbalizing high fear of falling. Pt planning for possible dc later today. Answered all pt and family questions in preparation for dc. Continue to recommend dc home with HHOT and will continue to follow acutely as admitted.    Follow Up Recommendations  Home health OT;Supervision/Assistance - 24 hour    Equipment Recommendations  None recommended by OT    Recommendations for Other Services PT consult    Precautions / Restrictions Precautions Precautions: Fall Precaution Comments: right hip with significant OA, needs replacement Restrictions Weight Bearing Restrictions: No       Mobility Bed Mobility Overal bed mobility: Needs Assistance Bed Mobility: Supine to Sit;Sit to Supine     Supine to sit: Min guard Sit to supine: Min guard   General bed mobility comments: Min Guard A for safety. Positioning bed to simulated home set up. Pt with increased difficulty and time for bed mobility  Transfers Overall transfer level: Needs assistance Equipment used: Rolling walker (2 wheeled) Transfers: Sit to/from Stand Sit to Stand: Min guard         General transfer comment: Min Guard A for safety    Balance Overall balance assessment: Needs assistance Sitting-balance support: No upper extremity supported Sitting balance-Leahy Scale: Good     Standing balance support: Bilateral upper extremity supported Standing balance-Leahy  Scale: Poor Standing balance comment: reliant on RW for balance                           ADL either performed or assessed with clinical judgement   ADL Overall ADL's : Needs assistance/impaired                 Upper Body Dressing : Set up;Supervision/safety;Sitting Upper Body Dressing Details (indicate cue type and reason): Donning/doffing second gown like jacket             Tub/ Shower Transfer: Min guard;Ambulation;Rolling walker;Shower seat   Functional mobility during ADLs: Min guard;Rolling walker General ADL Comments: SpO2 90s on 3L 02. Focused session on safe tub transfer techniques. Pt performing simulated tub transfer with RW and shower seat. Min Guard A for safety. Pt fatigued quickly     Vision       Perception     Praxis      Cognition Arousal/Alertness: Awake/alert Behavior During Therapy: WFL for tasks assessed/performed Overall Cognitive Status: Within Functional Limits for tasks assessed                                          Exercises Exercises: Other exercises Other Exercises Other Exercises: Neck ROM due to reported neck pain. Pt reporting he is unable to extention. Facilitating theraputic massage for tension release during ROM strengthes. Pt reporting and demonstrating increased neck ROM.    Shoulder Instructions  General Comments Wife present throughout session    Pertinent Vitals/ Pain       Pain Assessment: Faces Faces Pain Scale: Hurts even more Pain Location: right hip Pain Descriptors / Indicators: Grimacing;Guarding Pain Intervention(s): Monitored during session;Limited activity within patient's tolerance;Repositioned  Home Living                                          Prior Functioning/Environment              Frequency  Min 3X/week        Progress Toward Goals  OT Goals(current goals can now be found in the care plan section)  Progress towards OT goals:  Progressing toward goals  Acute Rehab OT Goals Patient Stated Goal: go home in 1-2 days OT Goal Formulation: With patient Time For Goal Achievement: 05/17/18 Potential to Achieve Goals: Good ADL Goals Pt Will Perform Grooming: with modified independence;standing Pt Will Perform Lower Body Dressing: with modified independence;sit to/from stand;with adaptive equipment Pt Will Transfer to Toilet: with modified independence;ambulating;regular height toilet Pt Will Perform Toileting - Clothing Manipulation and hygiene: with modified independence;sit to/from stand Pt Will Perform Tub/Shower Transfer: Tub transfer;shower seat;rolling walker;with modified independence  Plan Discharge plan remains appropriate    Co-evaluation                 AM-PAC PT "6 Clicks" Daily Activity     Outcome Measure   Help from another person eating meals?: None Help from another person taking care of personal grooming?: A Little Help from another person toileting, which includes using toliet, bedpan, or urinal?: A Little Help from another person bathing (including washing, rinsing, drying)?: A Lot Help from another person to put on and taking off regular upper body clothing?: A Little Help from another person to put on and taking off regular lower body clothing?: A Little 6 Click Score: 18    End of Session Equipment Utilized During Treatment: Gait belt;Rolling walker;Oxygen  OT Visit Diagnosis: Unsteadiness on feet (R26.81);Other abnormalities of gait and mobility (R26.89);Muscle weakness (generalized) (M62.81);Pain Pain - Right/Left: Right Pain - part of body: Hip   Activity Tolerance Patient tolerated treatment well;Patient limited by fatigue   Patient Left in bed;with call bell/phone within reach;with family/visitor present   Nurse Communication Mobility status        Time: 3754-3606 OT Time Calculation (min): 17 min  Charges: OT General Charges $OT Visit: 1 Visit OT  Treatments $Self Care/Home Management : 8-22 mins  [place], OTR/L Acute Rehab Pager: [phone] Office: [place] 05/05/2018, 12:04 PM

## 2018-05-06 LAB — BASIC METABOLIC PANEL
Anion gap: 9 (ref 5–15)
BUN: 49 mg/dL — AB (ref 8–23)
CALCIUM: 8.3 mg/dL — AB (ref 8.9–10.3)
CO2: 48 mmol/L — AB (ref 22–32)
Chloride: 83 mmol/L — ABNORMAL LOW (ref 98–111)
Creatinine, Ser: 1.13 mg/dL (ref 0.61–1.24)
GFR calc Af Amer: >60 mL/min (ref >60)
GFR calc non Af Amer: >60 mL/min (ref >60)
GLUCOSE: 78 mg/dL (ref 70–99)
Potassium: 3.5 mmol/L (ref 3.5–5.1)
Sodium: 140 mmol/L (ref 135–145)

## 2018-05-06 LAB — CULTURE, BLOOD (ROUTINE X 2)
CULTURE: NO GROWTH
Culture: NO GROWTH

## 2018-05-06 LAB — CBC
HCT: 37.3 % — ABNORMAL LOW (ref 39.0–52.0)
Hemoglobin: 10.9 g/dL — ABNORMAL LOW (ref 13.0–17.0)
MCH: 25.3 pg — AB (ref 26.0–34.0)
MCHC: 29.2 g/dL — AB (ref 30.0–36.0)
MCV: 86.7 fL (ref 78.0–100.0)
Platelets: 345 10*3/uL (ref 150–400)
RBC: 4.3 MIL/uL (ref 4.22–5.81)
RDW: 16.6 % — ABNORMAL HIGH (ref 11.5–15.5)
WBC: 11.6 10*3/uL — ABNORMAL HIGH (ref 4.0–10.5)

## 2018-05-06 LAB — GLUCOSE, CAPILLARY
GLUCOSE-CAPILLARY: 239 mg/dL — AB (ref 70–99)
Glucose-Capillary: 114 mg/dL — ABNORMAL HIGH (ref 70–99)
Glucose-Capillary: 149 mg/dL — ABNORMAL HIGH (ref 70–99)

## 2018-05-06 MED ORDER — ROSUVASTATIN CALCIUM 20 MG PO TABS: 20.0000 mg | ORAL_TABLET | Freq: Every day | ORAL | 0 refills | Status: DC
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
## 2018-05-06 NOTE — Care Management Note (Signed)
Case Management Note Marvetta Gibbons RN, BSN Unit 4E-Case Manager [phone]  Patient Details  Name: Nicholas Caldwell MRN: [id number] Date of Birth: [birth year]  Subjective/Objective:   Pt admitted with COPD and CHF                 Action/Plan: PTA pt lived at home with wife, pt has home 02 baseline at 2L with [facility]. Per PT/OT evals recommendations for [facility] therapies- will need orders for RN/PT/OT/SW. Spoke with pt and wife at bedside- per conversation pt has been with [facility] in past- list provided for choice pt would like to use someone different this time and have chosen [facility] for services. Pt has PCP at [facility]- [facility]. [facility]-([facility] Education officer, museum at [place] is [employer]. Will need d/c summary faxed to [place] at [phone]. Call made to [facility] with [facility]- for [facility] referral- pending orders for [facility] needs. CM to follow for transition of care.    Expected Discharge Date:  05/06/18               Expected Discharge Plan:  [place]  In-House Referral:  Clinical Social Work  Discharge planning Services  CM Consult  Post Acute Care Choice:  Home Health Choice offered to:  Patient  DME Arranged:    DME Agency:     HH Arranged:  RN, Disease Management, OT, PT [place] Agency:  [facility]  Status of Service:  Completed, signed off  If discussed at [place] of Stay Meetings, dates discussed:    Discharge Disposition: home/home health   Additional Comments:  05/06/18- 1400- Lillyona Polasek RN, CM- pt for discharge home today with wife- orders have been placed for HHRN/PT/OT- notified Dorian Pod with [facility] of orders and transition to home today. No further CM needs noted.   Dawayne Patricia, RN 05/06/2018, 1:59 PM

## 2018-05-06 NOTE — Progress Notes (Signed)
Nicholas Caldwell to be D/C'd Home per MD order. Discussed with the patient and all questions fully answered.    IV catheter discontinued intact. Site without signs and symptoms of complications. Dressing and pressure applied.  An After Visit Summary was printed and given to the patient.  Patient to be escorted via [place], and D/C home via private auto.  [name]  05/06/2018 1:20 PM

## 2018-05-06 NOTE — Progress Notes (Signed)
Inpatient Diabetes Program Recommendations  AACE/ADA: New Consensus Statement on Inpatient Glycemic Control (2015)  Target Ranges:  Prepandial:   less than 140 mg/dL      Peak postprandial:   less than 180 mg/dL (1-2 hours)      Critically ill patients:  140 - 180 mg/dL   Lab Results  Component Value Date   GLUCAP 114 (H) 05/06/2018   HGBA1C 8.1 (H) 05/01/2018    Review of Glycemic Control  Needs more Lantus.   Inpatient Diabetes Program Recommendations:     Increase Lantus to 20 units QHS  Continue to follow.  Thank you. Lorenda Peck, RD, LDN, CDE Inpatient Diabetes Coordinator [phone]

## 2018-05-06 NOTE — Progress Notes (Signed)
Physical Therapy Treatment Patient Details Name: Nicholas Caldwell MRN: [id number] DOB: 1946-10-17 Today's Date: 05/06/2018    History of Present Illness Pt is a 72 y.o. male presenting with 3 days of progressive shortness of breath secondary to a mixed copd and chf.  PMH significant for Afib, DM and obesity.    PT Comments    Pt con't to have a drop in SpO2 into 70s while on 3LO2 via [place] during ambulation. SpO2 in 80s on 6LO2 via [place] during ambulation.  Pt functioning at min guard. Mobility greatly limited by SOB. Acute PT to con't to follow.  Follow Up Recommendations  Home health PT;Supervision - Intermittent     Equipment Recommendations  None recommended by PT    Recommendations for Other Services       Precautions / Restrictions Precautions Precautions: Fall Precaution Comments: watch O2 Restrictions Weight Bearing Restrictions: No    Mobility  Bed Mobility Overal bed mobility: Needs Assistance Bed Mobility: Supine to Sit;Sit to Supine     Supine to sit: Supervision     General bed mobility comments: HOB elevated, increased time but able to complete without physical assist  Transfers Overall transfer level: Needs assistance Equipment used: Rolling walker (2 wheeled) Transfers: Sit to/from Stand Sit to Stand: Min guard         General transfer comment: increased time, min guard for safety  Ambulation/Gait Ambulation/Gait assistance: Min guard Gait Distance (Feet): 75 Feet(x2) Assistive device: Rolling walker (2 wheeled) Gait Pattern/deviations: Step-through pattern;Trunk flexed;Antalgic Gait velocity: decreased Gait velocity interpretation: <1.31 ft/sec, indicative of household ambulator General Gait Details: pt required seated rest break s/p 19' pt SpO2 at 76% on 4LO2 via [place]. Pts O2 increased to 6Lo2 via [place], pt with SPO2 at 86%. pt required standing rest break during the second 34'   Stairs             Wheelchair Mobility    Modified Rankin  (Stroke Patients Only)       Balance Overall balance assessment: Needs assistance Sitting-balance support: No upper extremity supported Sitting balance-Leahy Scale: Good     Standing balance support: Bilateral upper extremity supported;During functional activity Standing balance-Leahy Scale: Poor Standing balance comment: reliant on RW for balance, stood at walker and had bilat elbows on RW while using urinal and then at counter of sink while washing hands.                             Cognition Arousal/Alertness: Awake/alert Behavior During Therapy: WFL for tasks assessed/performed Overall Cognitive Status: Within Functional Limits for tasks assessed                                        Exercises      General Comments General comments (skin integrity, edema, etc.): pt sat at EOB and ate lunch without difficulty      Pertinent Vitals/Pain Pain Assessment: 0-10 Pain Score: 6  Pain Location: R hip, back and neck Pain Descriptors / Indicators: Aching;Constant Pain Intervention(s): Monitored during session    Home Living                      Prior Function            PT Goals (current goals can now be found in the care plan section) Progress towards PT goals:  Progressing toward goals    Frequency    Min 3X/week      PT Plan Current plan remains appropriate    Co-evaluation              AM-PAC PT "6 Clicks" Daily Activity  Outcome Measure  Difficulty turning over in bed (including adjusting bedclothes, sheets and blankets)?: A Little Difficulty moving from lying on back to sitting on the side of the bed? : A Little Difficulty sitting down on and standing up from a chair with arms (e.g., wheelchair, bedside commode, etc,.)?: A Little Help needed moving to and from a bed to chair (including a wheelchair)?: A Little Help needed walking in hospital room?: A Little Help needed climbing 3-5 steps with a railing? : A  Little 6 Click Score: 18    End of Session Equipment Utilized During Treatment: Oxygen;Gait belt Activity Tolerance: Patient tolerated treatment well Patient left: in bed;with call bell/phone within reach;with family/visitor present Nurse Communication: Mobility status PT Visit Diagnosis: Repeated falls (R29.6);Muscle weakness (generalized) (M62.81)     Time: 7530-0511 PT Time Calculation (min) (ACUTE ONLY): 26 min  Charges:  $Gait Training: 8-22 mins $Therapeutic Activity: 8-22 mins                    G Codes:       Kittie Plater, PT, DPT Pager #: [phone] Office #: [phone]    [place] 05/06/2018, 1:45 PM

## 2018-05-06 NOTE — Progress Notes (Signed)
Pt's night blood glucose level was 425. The intern for [facility], Ouida Sills, was informed. She discontinued the sliding scale insulin and put in a new sliding scale for Novolog 0 - 20 Units with meals, but ordered to go ahead and give the pt 20 Units of Novolog. Anderson also ordered subcutaneous Lantus insulin 10 Units daily at bedtime.  After the 20 Units Novolog and 10 Units Lantus were given, the blood glucose was rechecked 30 minutes later and showed to be 239. Will continue to monitor.  Lupita Dawn, RN

## 2018-05-10 DIAGNOSIS — Z8701 Personal history of pneumonia (recurrent): Secondary | ICD-10-CM | POA: Diagnosis not present

## 2018-05-10 DIAGNOSIS — F17210 Nicotine dependence, cigarettes, uncomplicated: Secondary | ICD-10-CM | POA: Diagnosis not present

## 2018-05-10 DIAGNOSIS — I5022 Chronic systolic (congestive) heart failure: Secondary | ICD-10-CM | POA: Diagnosis not present

## 2018-05-10 DIAGNOSIS — Z7901 Long term (current) use of anticoagulants: Secondary | ICD-10-CM | POA: Diagnosis not present

## 2018-05-10 DIAGNOSIS — D649 Anemia, unspecified: Secondary | ICD-10-CM | POA: Diagnosis not present

## 2018-05-10 DIAGNOSIS — Z7951 Long term (current) use of inhaled steroids: Secondary | ICD-10-CM | POA: Diagnosis not present

## 2018-05-10 DIAGNOSIS — E1142 Type 2 diabetes mellitus with diabetic polyneuropathy: Secondary | ICD-10-CM | POA: Diagnosis not present

## 2018-05-10 DIAGNOSIS — I110 Hypertensive heart disease with heart failure: Secondary | ICD-10-CM | POA: Diagnosis not present

## 2018-05-10 DIAGNOSIS — J9611 Chronic respiratory failure with hypoxia: Secondary | ICD-10-CM | POA: Diagnosis not present

## 2018-05-10 DIAGNOSIS — I429 Cardiomyopathy, unspecified: Secondary | ICD-10-CM | POA: Diagnosis not present

## 2018-05-10 DIAGNOSIS — G4733 Obstructive sleep apnea (adult) (pediatric): Secondary | ICD-10-CM | POA: Diagnosis not present

## 2018-05-10 DIAGNOSIS — G8929 Other chronic pain: Secondary | ICD-10-CM | POA: Diagnosis not present

## 2018-05-10 DIAGNOSIS — Z9981 Dependence on supplemental oxygen: Secondary | ICD-10-CM | POA: Diagnosis not present

## 2018-05-10 DIAGNOSIS — I272 Pulmonary hypertension, unspecified: Secondary | ICD-10-CM | POA: Diagnosis not present

## 2018-05-10 DIAGNOSIS — J431 Panlobular emphysema: Secondary | ICD-10-CM | POA: Diagnosis not present

## 2018-05-10 DIAGNOSIS — E782 Mixed hyperlipidemia: Secondary | ICD-10-CM | POA: Diagnosis not present

## 2018-05-10 DIAGNOSIS — I480 Paroxysmal atrial fibrillation: Secondary | ICD-10-CM | POA: Diagnosis not present

## 2018-05-10 DIAGNOSIS — Z6841 Body Mass Index (BMI) 40.0 and over, adult: Secondary | ICD-10-CM | POA: Diagnosis not present

## 2018-05-10 DIAGNOSIS — I4892 Unspecified atrial flutter: Secondary | ICD-10-CM | POA: Diagnosis not present

## 2018-05-10 DIAGNOSIS — Z7984 Long term (current) use of oral hypoglycemic drugs: Secondary | ICD-10-CM | POA: Diagnosis not present

## 2018-05-10 DIAGNOSIS — I2510 Atherosclerotic heart disease of native coronary artery without angina pectoris: Secondary | ICD-10-CM | POA: Diagnosis not present

## 2018-05-10 DIAGNOSIS — Z9181 History of falling: Secondary | ICD-10-CM | POA: Diagnosis not present

## 2018-05-13 ENCOUNTER — Encounter (HOSPITAL_COMMUNITY): Payer: Medicare Other | Admitting: Cardiology

## 2018-05-18 DIAGNOSIS — I4892 Unspecified atrial flutter: Secondary | ICD-10-CM | POA: Diagnosis not present

## 2018-05-18 DIAGNOSIS — I480 Paroxysmal atrial fibrillation: Secondary | ICD-10-CM | POA: Diagnosis not present

## 2018-05-18 DIAGNOSIS — J431 Panlobular emphysema: Secondary | ICD-10-CM | POA: Diagnosis not present

## 2018-05-18 DIAGNOSIS — I110 Hypertensive heart disease with heart failure: Secondary | ICD-10-CM | POA: Diagnosis not present

## 2018-05-18 DIAGNOSIS — I5022 Chronic systolic (congestive) heart failure: Secondary | ICD-10-CM | POA: Diagnosis not present

## 2018-05-18 DIAGNOSIS — E1142 Type 2 diabetes mellitus with diabetic polyneuropathy: Secondary | ICD-10-CM | POA: Diagnosis not present

## 2018-05-18 IMAGING — DX DG CHEST 1V PORT
1 series · 1 of 1 positions shown · non-contrast
Comparison: 10/31/2016

CLINICAL DATA: Acute onset shortness of breath. Flu-like symptoms
for a few days. Low oxygen saturation.

EXAM:
PORTABLE CHEST 1 VIEW

[chest ap]
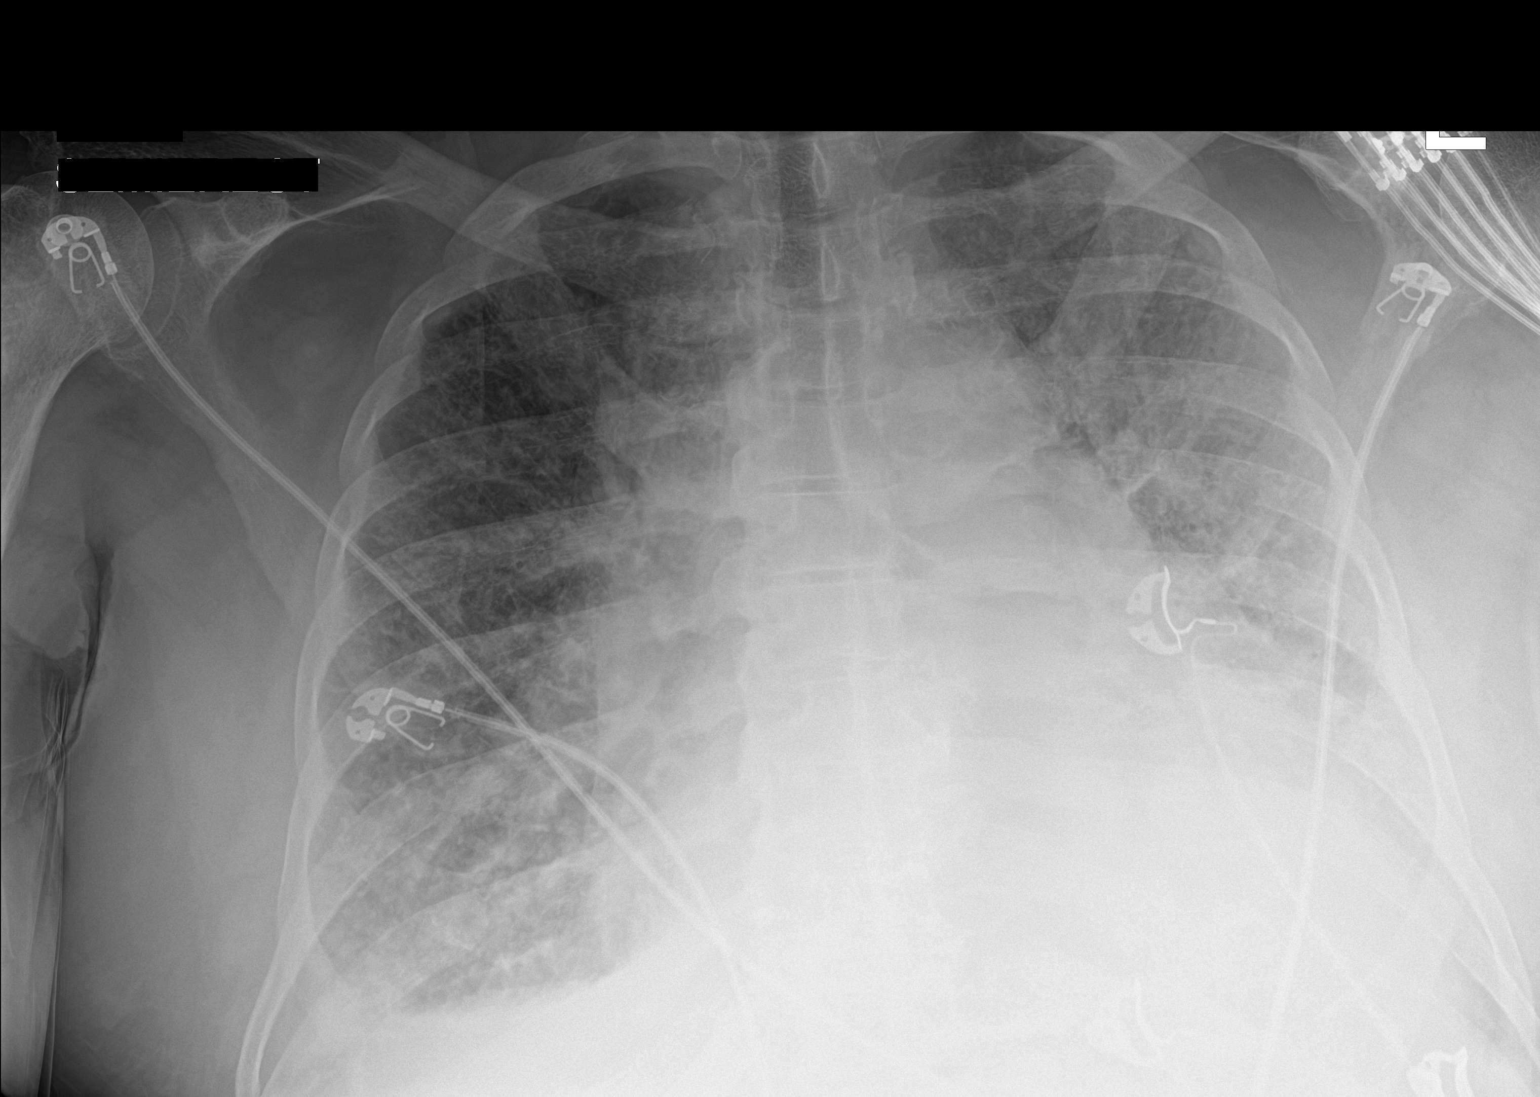

[1 of 1 positions shown; findings below may reference images not displayed]

FINDINGS: Cardiac enlargement with pulmonary vascular congestion. Diffuse
bilateral airspace infiltration in the lungs, progressing since
prior study. This consistent with edema. Small bilateral pleural
effusions. No pneumothorax. Bilateral cervical ribs.
IMPRESSION: Progressing congestive changes in the heart and lungs with cardiac
enlargement, pulmonary vascular congestion, bilateral airspace
edema, and small pleural effusions.

## 2018-05-18 IMAGING — CR DG CHEST 1V PORT
1 series · 1 of 1 positions shown · non-contrast
Comparison: 12/13/2016 chest radiograph

CLINICAL DATA: 70 y/o  M; tube change.

EXAM:
PORTABLE CHEST 1 VIEW

[portable]
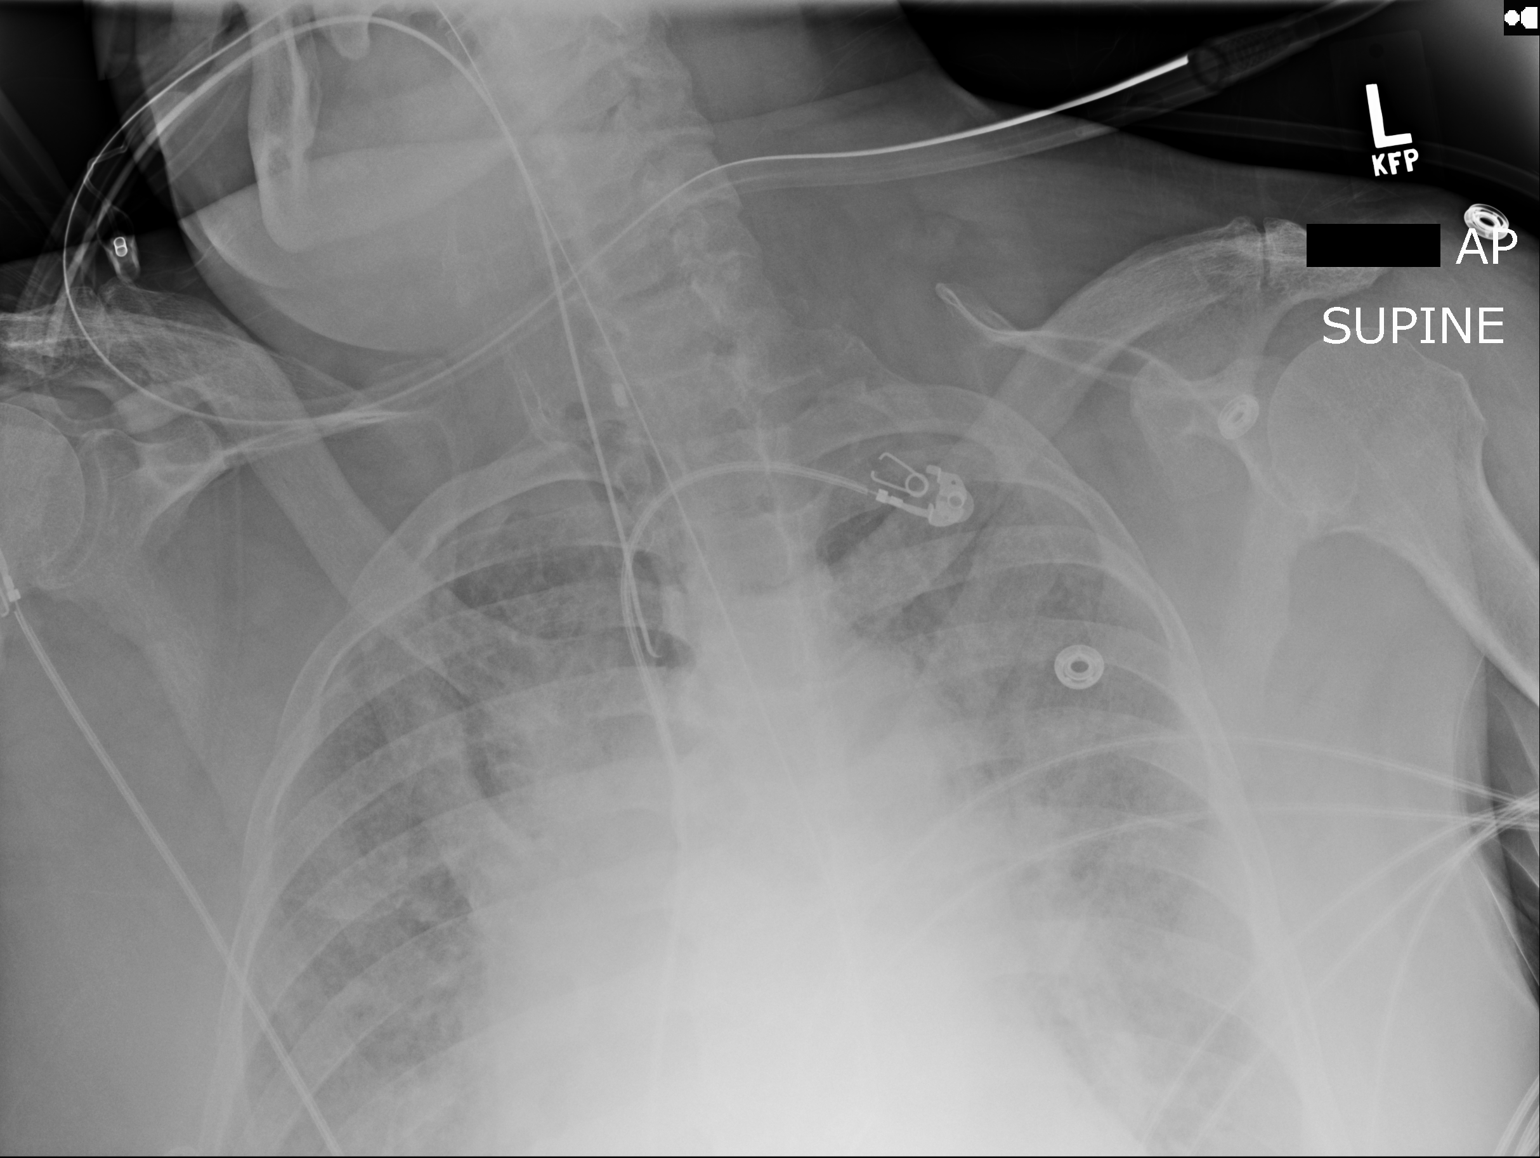

[1 of 1 positions shown; findings below may reference images not displayed]

FINDINGS: Lung bases and diaphragm are excluded from the field of view.
Endotracheal tube is 5.3 cm from the carina. Cardiomegaly. Diffuse
pulmonary edema. Enteric tube tip extends below the field of view.
IMPRESSION: Lung bases and diaphragm are excluded from the field of view.
Endotracheal tube is 5.3 cm from the carina. Cardiomegaly. Diffuse
pulmonary edema. Enteric tube tip extends below the field of view.

By: Olimpia Tiger M.D.

## 2018-05-18 IMAGING — DX DG CHEST 1V PORT
1 series · 1 of 1 positions shown · non-contrast
Comparison: 12/13/2016

CLINICAL DATA: Intubation

EXAM:
PORTABLE CHEST 1 VIEW

[chest ap]
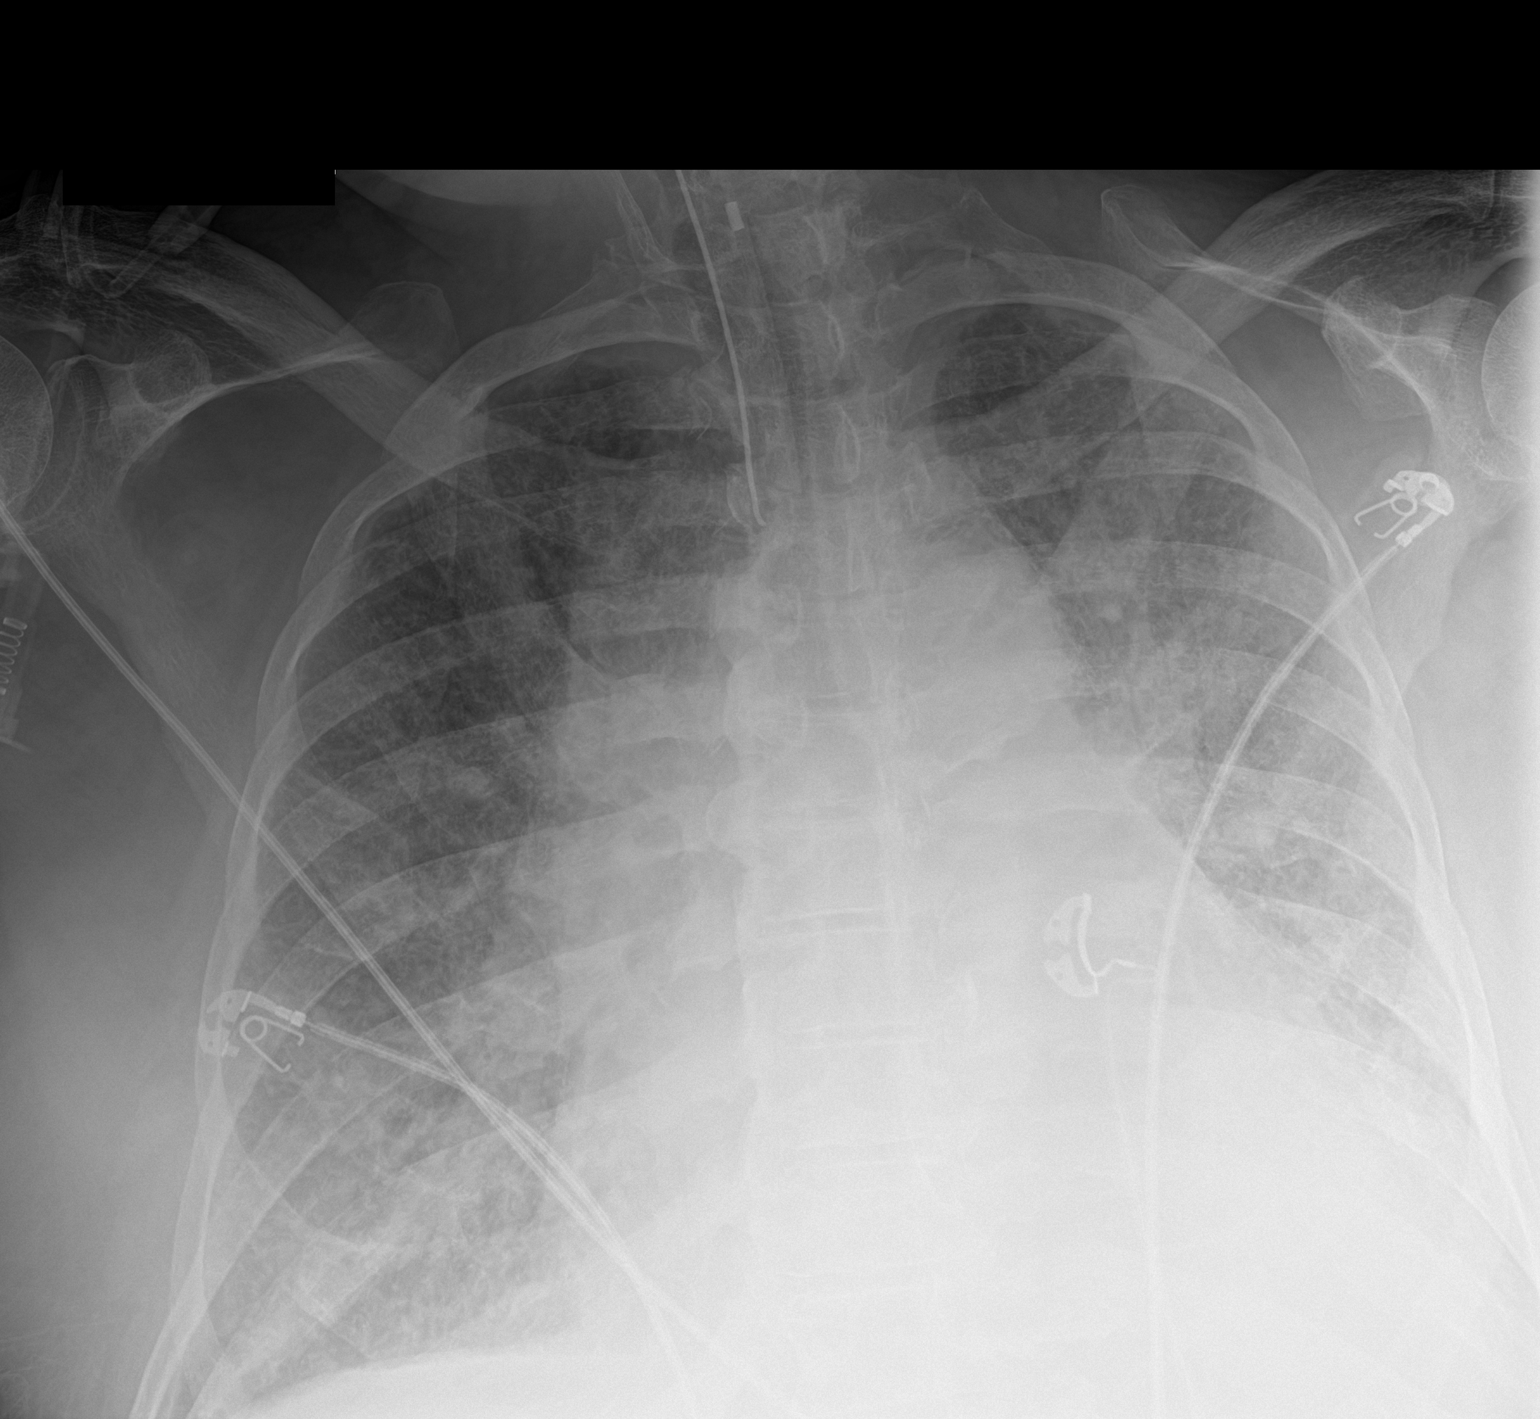

[1 of 1 positions shown; findings below may reference images not displayed]

FINDINGS: Endotracheal tube tip is approximately 5.1 cm superior to the
carina. Continued cardiomegaly with central vascular congestion can
moderate diffuse pulmonary edema. Consolidation at the left lung
base. Cannot exclude small left pleural effusion. No pneumothorax.
Non inclusion of the right CP angle.
IMPRESSION: Endotracheal tube tip is approximately 5.1 cm superior to the
carina. Continued cardiomegaly with central vascular congestion and
moderate diffuse pulmonary edema. Probable small left pleural
effusion.

## 2018-05-19 ENCOUNTER — Encounter (HOSPITAL_COMMUNITY): Payer: Medicare Other

## 2018-05-19 IMAGING — CR DG CHEST 1V PORT
1 series · 1 of 1 positions shown · non-contrast
Comparison: 12/13/2016.

CLINICAL DATA: Abnormal respirations. Hypertension. Chronic
systolic heart failure.

EXAM:
PORTABLE CHEST 1 VIEW

[AP]
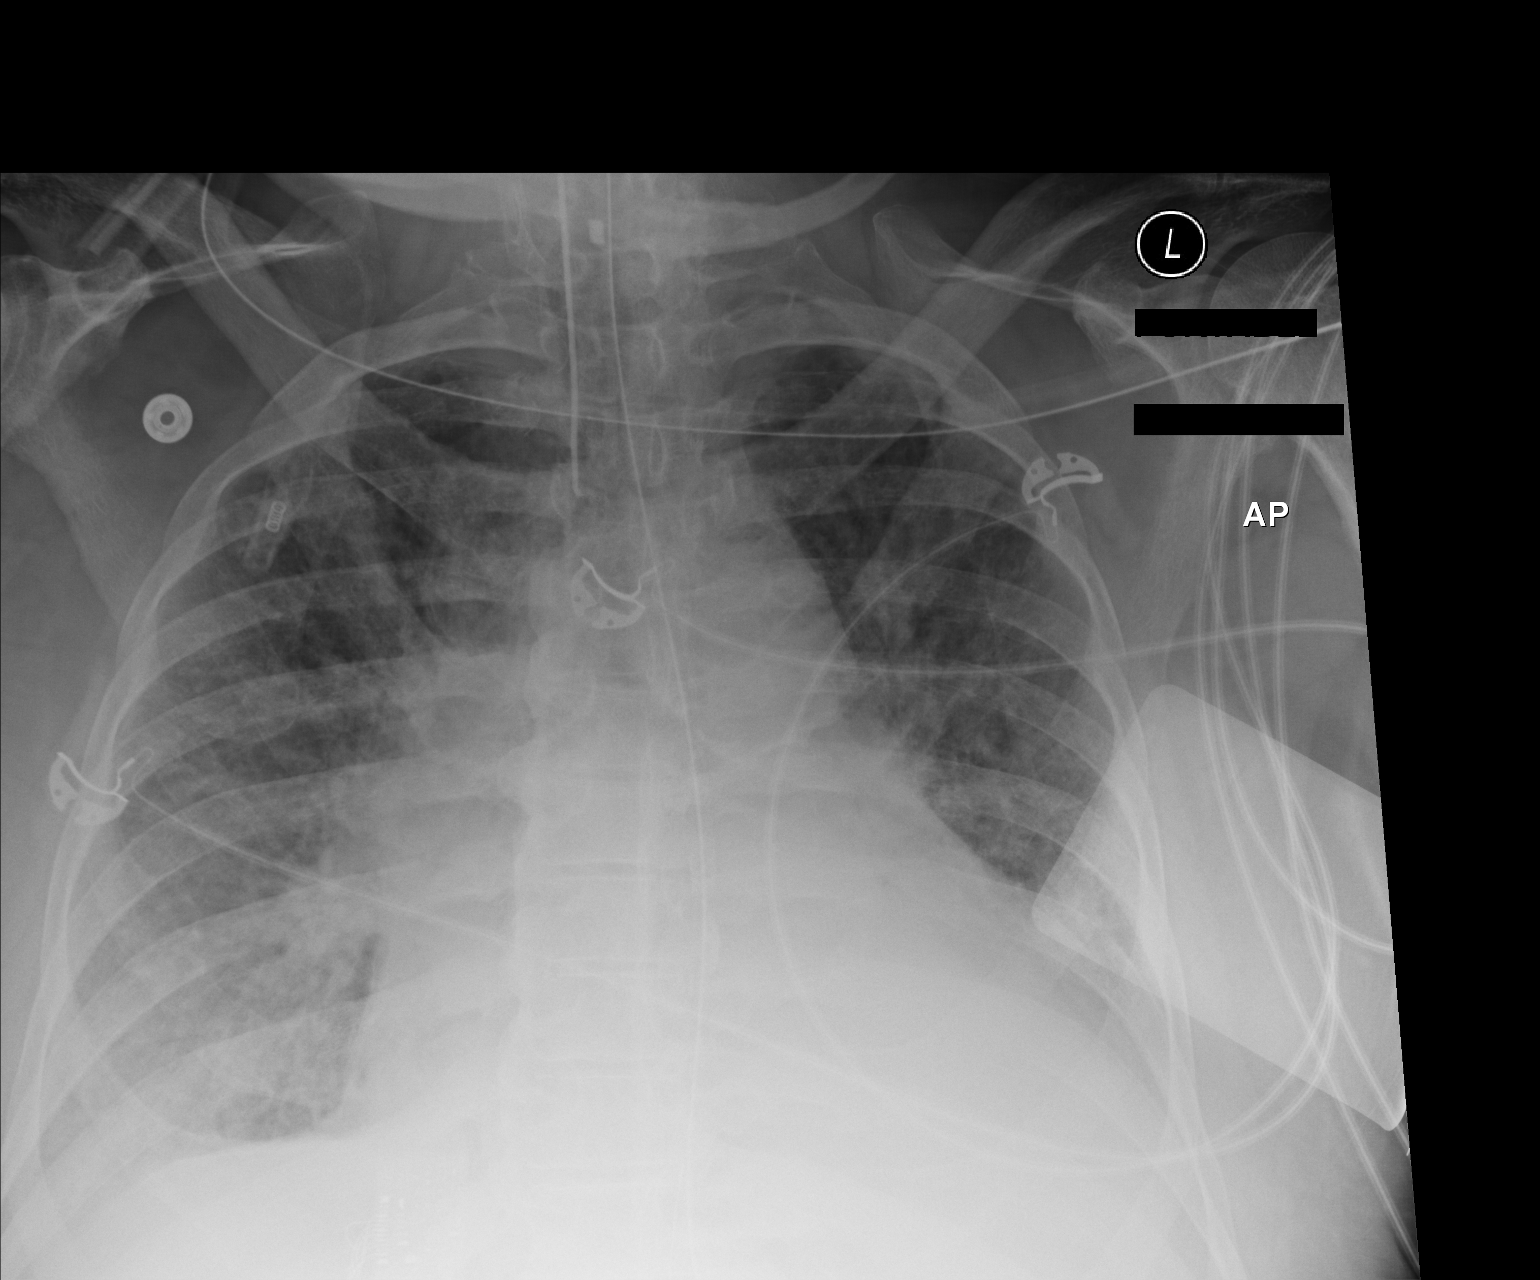

[1 of 1 positions shown; findings below may reference images not displayed]

FINDINGS: Cardiomegaly. BILATERAL pulmonary opacities favored to represent
pulmonary edema appears slightly improved from yesterday's
radiograph. ET tube remains 5.8 cm above carina. Enteric tube tip
lies below the diaphragm. No osseous findings.
IMPRESSION: Cardiomegaly with BILATERAL pulmonary opacities appears slightly
improved, possible clearing edema.

## 2018-05-20 IMAGING — CR DG CHEST 1V PORT
1 series · 1 of 1 positions shown · non-contrast
Comparison: 12/14/2016

CLINICAL DATA: Endotracheal tube position

EXAM:
PORTABLE CHEST 1 VIEW

[AP]
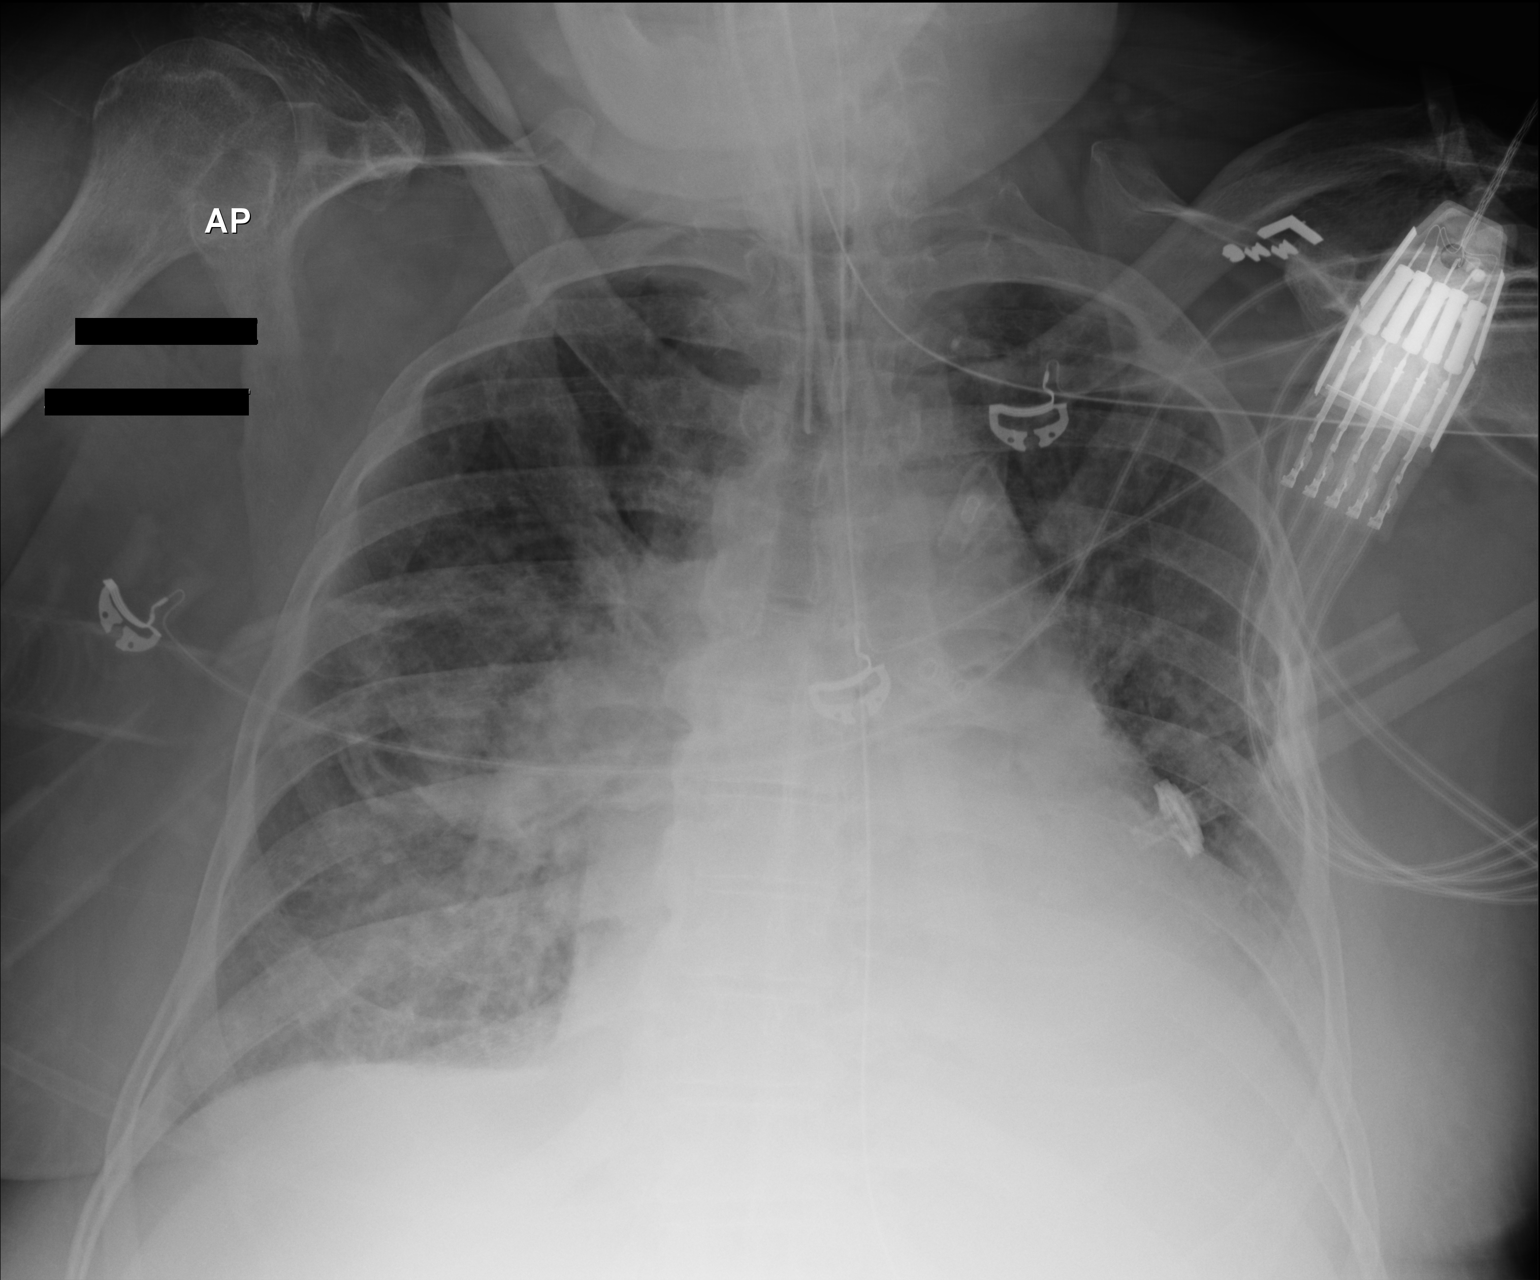

[1 of 1 positions shown; findings below may reference images not displayed]

FINDINGS: Endotracheal tube in good position.  NG tube in the stomach.

Diffuse bilateral airspace disease without significant change.
Bibasilar atelectasis. Small pleural effusions bilaterally.
IMPRESSION: Endotracheal tube remains in good position

Diffuse bilateral airspace disease unchanged, probable edema versus
pneumonia.

Bibasilar atelectasis and small effusions unchanged.

## 2018-05-21 IMAGING — CR DG CHEST 1V PORT
1 series · 1 of 1 positions shown · non-contrast
Comparison: 12/15/2016.

CLINICAL DATA: Respiratory failure.

EXAM:
PORTABLE CHEST 1 VIEW

[AP]
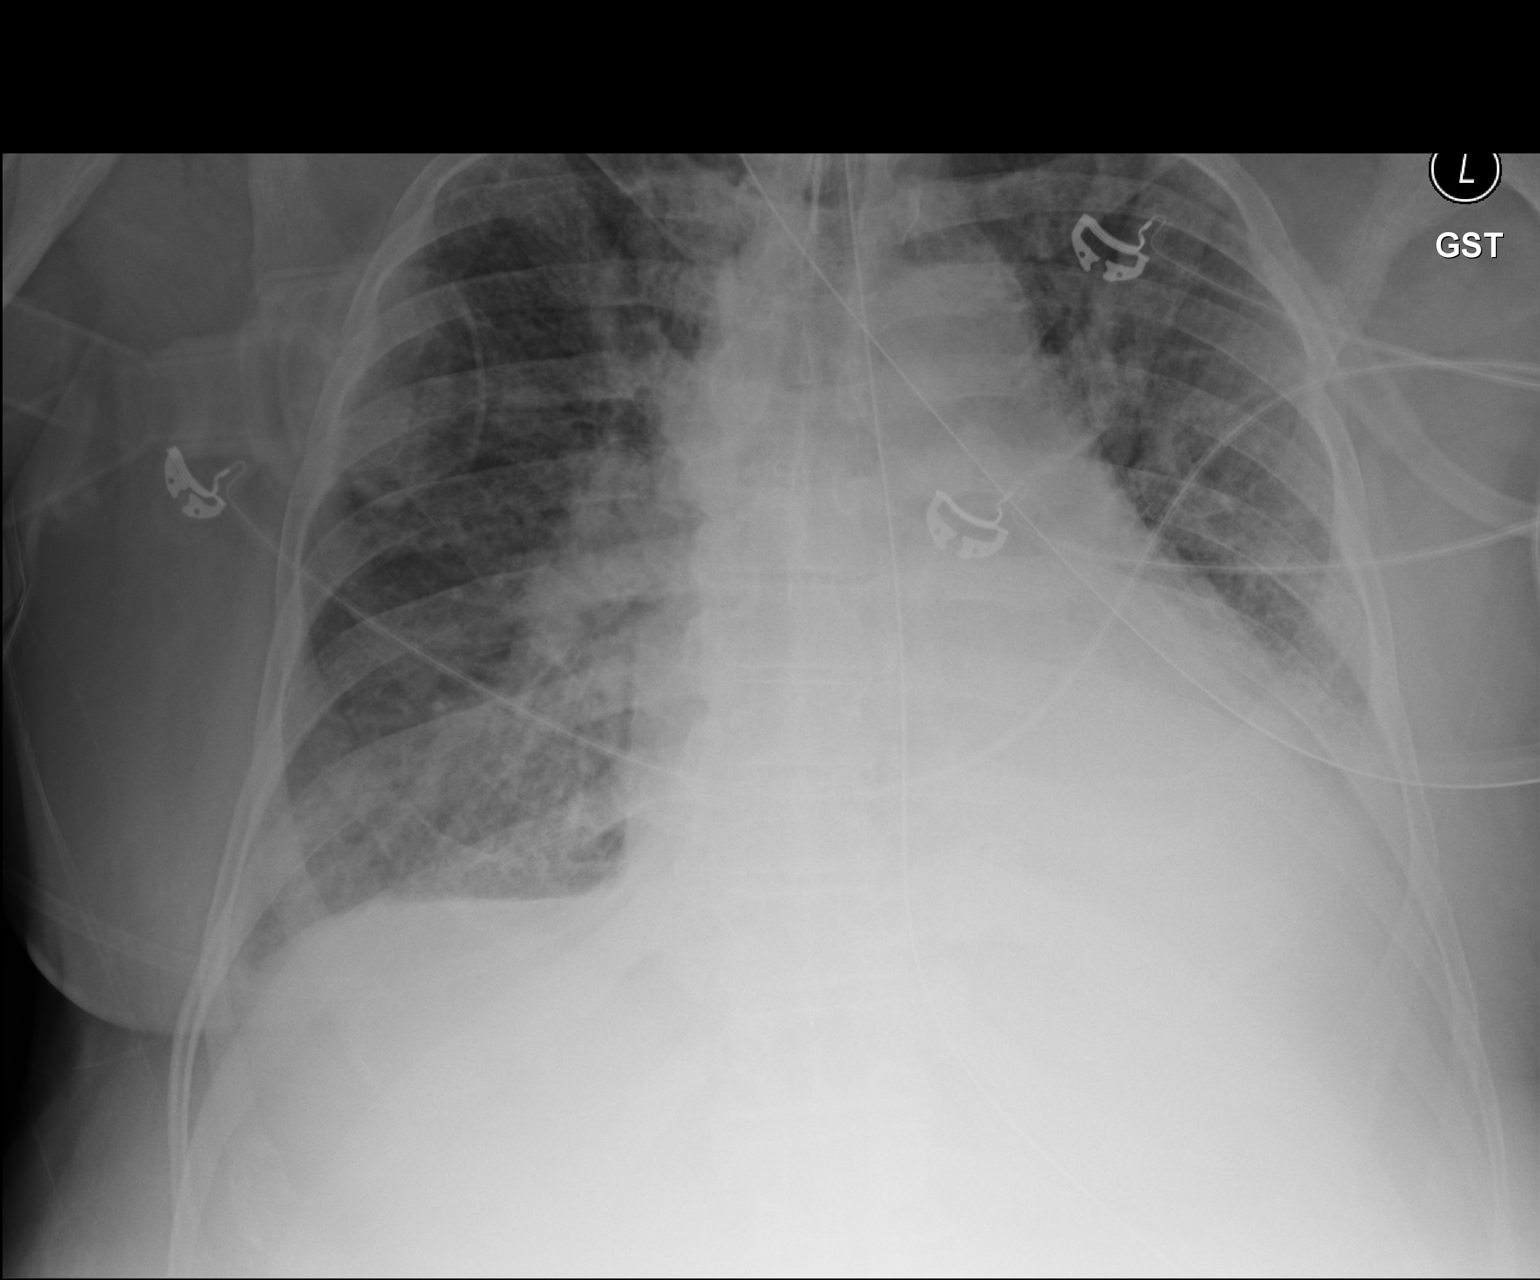

[1 of 1 positions shown; findings below may reference images not displayed]

FINDINGS: Endotracheal tube, NG tube in stable position. Cardiomegaly with
bilateral pulmonary interstitial prominence. Left lower lobe
atelectasis and consolidation. Small left pleural effusion. No
pneumothorax.
IMPRESSION: 1. Lines and tubes in stable position.

2. Cardiomegaly with diffuse bilateral pulmonary interstitial
prominence and left pleural effusion consistent with congestive
heart failure.

3. Left lower lobe atelectasis and consolidation. Left lower lobe
pneumonia cannot be excluded.

## 2018-05-27 ENCOUNTER — Encounter (HOSPITAL_COMMUNITY): Payer: Self-pay

## 2018-05-27 ENCOUNTER — Ambulatory Visit (HOSPITAL_COMMUNITY)
Admission: RE | Admit: 2018-05-27 | Discharge: 2018-05-27 | Disposition: A | Discharge status: Home / Self Care | Payer: Medicare Other | Source: Ambulatory Visit | Attending: Cardiology | Admitting: Cardiology

## 2018-05-27 VITALS — BP 124/64 | HR 84 | Wt 274.0 lb

## 2018-05-27 DIAGNOSIS — R4182 Altered mental status, unspecified: Secondary | ICD-10-CM | POA: Diagnosis not present

## 2018-05-27 DIAGNOSIS — I2510 Atherosclerotic heart disease of native coronary artery without angina pectoris: Secondary | ICD-10-CM | POA: Diagnosis not present

## 2018-05-27 DIAGNOSIS — Z79891 Long term (current) use of opiate analgesic: Secondary | ICD-10-CM | POA: Diagnosis not present

## 2018-05-27 DIAGNOSIS — Z9981 Dependence on supplemental oxygen: Secondary | ICD-10-CM | POA: Diagnosis not present

## 2018-05-27 DIAGNOSIS — J439 Emphysema, unspecified: Secondary | ICD-10-CM | POA: Insufficient documentation

## 2018-05-27 DIAGNOSIS — E7800 Pure hypercholesterolemia, unspecified: Secondary | ICD-10-CM | POA: Diagnosis not present

## 2018-05-27 DIAGNOSIS — Z9114 Patient's other noncompliance with medication regimen: Secondary | ICD-10-CM | POA: Insufficient documentation

## 2018-05-27 DIAGNOSIS — J449 Chronic obstructive pulmonary disease, unspecified: Secondary | ICD-10-CM | POA: Diagnosis not present

## 2018-05-27 DIAGNOSIS — F17210 Nicotine dependence, cigarettes, uncomplicated: Secondary | ICD-10-CM | POA: Diagnosis not present

## 2018-05-27 DIAGNOSIS — G8929 Other chronic pain: Secondary | ICD-10-CM | POA: Diagnosis not present

## 2018-05-27 DIAGNOSIS — Z79899 Other long term (current) drug therapy: Secondary | ICD-10-CM | POA: Diagnosis not present

## 2018-05-27 DIAGNOSIS — Z7952 Long term (current) use of systemic steroids: Secondary | ICD-10-CM | POA: Insufficient documentation

## 2018-05-27 DIAGNOSIS — J9621 Acute and chronic respiratory failure with hypoxia: Secondary | ICD-10-CM | POA: Insufficient documentation

## 2018-05-27 DIAGNOSIS — N179 Acute kidney failure, unspecified: Secondary | ICD-10-CM | POA: Insufficient documentation

## 2018-05-27 DIAGNOSIS — I429 Cardiomyopathy, unspecified: Secondary | ICD-10-CM | POA: Diagnosis not present

## 2018-05-27 DIAGNOSIS — G4733 Obstructive sleep apnea (adult) (pediatric): Secondary | ICD-10-CM | POA: Diagnosis not present

## 2018-05-27 DIAGNOSIS — J9610 Chronic respiratory failure, unspecified whether with hypoxia or hypercapnia: Secondary | ICD-10-CM | POA: Diagnosis not present

## 2018-05-27 DIAGNOSIS — Z09 Encounter for follow-up examination after completed treatment for conditions other than malignant neoplasm: Secondary | ICD-10-CM | POA: Insufficient documentation

## 2018-05-27 DIAGNOSIS — I5022 Chronic systolic (congestive) heart failure: Secondary | ICD-10-CM | POA: Diagnosis not present

## 2018-05-27 DIAGNOSIS — I471 Supraventricular tachycardia: Secondary | ICD-10-CM | POA: Diagnosis not present

## 2018-05-27 DIAGNOSIS — I480 Paroxysmal atrial fibrillation: Secondary | ICD-10-CM | POA: Diagnosis not present

## 2018-05-27 DIAGNOSIS — Z8249 Family history of ischemic heart disease and other diseases of the circulatory system: Secondary | ICD-10-CM | POA: Insufficient documentation

## 2018-05-27 DIAGNOSIS — Z794 Long term (current) use of insulin: Secondary | ICD-10-CM | POA: Insufficient documentation

## 2018-05-27 DIAGNOSIS — E119 Type 2 diabetes mellitus without complications: Secondary | ICD-10-CM | POA: Insufficient documentation

## 2018-05-27 DIAGNOSIS — I272 Pulmonary hypertension, unspecified: Secondary | ICD-10-CM | POA: Diagnosis not present

## 2018-05-27 DIAGNOSIS — I4892 Unspecified atrial flutter: Secondary | ICD-10-CM | POA: Diagnosis not present

## 2018-05-27 DIAGNOSIS — I110 Hypertensive heart disease with heart failure: Secondary | ICD-10-CM | POA: Diagnosis not present

## 2018-05-27 DIAGNOSIS — Z7901 Long term (current) use of anticoagulants: Secondary | ICD-10-CM | POA: Insufficient documentation

## 2018-05-27 LAB — BASIC METABOLIC PANEL
ANION GAP: 11 (ref 5–15)
BUN: 11 mg/dL (ref 8–23)
CALCIUM: 8.6 mg/dL — AB (ref 8.9–10.3)
CHLORIDE: 98 mmol/L (ref 98–111)
CO2: 32 mmol/L (ref 22–32)
Creatinine, Ser: 0.96 mg/dL (ref 0.61–1.24)
GFR calc Af Amer: >60 mL/min (ref >60)
GFR calc non Af Amer: >60 mL/min (ref >60)
GLUCOSE: 165 mg/dL — AB (ref 70–99)
Potassium: 4.2 mmol/L (ref 3.5–5.1)
Sodium: 141 mmol/L (ref 135–145)

## 2018-05-27 MED ORDER — LISINOPRIL 20 MG PO TABS: 20.0000 mg | ORAL_TABLET | Freq: Every day | ORAL | 3 refills | Status: DC

## 2018-05-27 NOTE — Patient Instructions (Signed)
Labs today (will call for abnormal results, otherwise no news is good news)  INCREASE Lisinopril to 20 mg Once Daily  Follow up in 2-3 weeks.
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
## 2018-05-27 NOTE — Addendum Note (Signed)
Encounter addended by: Georgiana Shore, NP on: 05/27/2018 4:40 PM  Actions taken: Sign clinical note

## 2018-06-02 DIAGNOSIS — I5022 Chronic systolic (congestive) heart failure: Secondary | ICD-10-CM | POA: Diagnosis not present

## 2018-06-02 DIAGNOSIS — I110 Hypertensive heart disease with heart failure: Secondary | ICD-10-CM | POA: Diagnosis not present

## 2018-06-02 DIAGNOSIS — I480 Paroxysmal atrial fibrillation: Secondary | ICD-10-CM | POA: Diagnosis not present

## 2018-06-02 DIAGNOSIS — E1142 Type 2 diabetes mellitus with diabetic polyneuropathy: Secondary | ICD-10-CM | POA: Diagnosis not present

## 2018-06-02 DIAGNOSIS — I4892 Unspecified atrial flutter: Secondary | ICD-10-CM | POA: Diagnosis not present

## 2018-06-02 DIAGNOSIS — J431 Panlobular emphysema: Secondary | ICD-10-CM | POA: Diagnosis not present

## 2018-06-15 ENCOUNTER — Inpatient Hospital Stay (HOSPITAL_COMMUNITY)
Admission: RE | Admit: 2018-06-15 | Discharge: 2018-06-15 | Disposition: A | Discharge status: Home / Self Care | Payer: Medicare Other | Source: Ambulatory Visit

## 2018-06-24 ENCOUNTER — Encounter (HOSPITAL_COMMUNITY): Payer: Medicare Other

## 2018-07-06 ENCOUNTER — Inpatient Hospital Stay (HOSPITAL_COMMUNITY)
Admission: RE | Admit: 2018-07-06 | Discharge: 2018-07-06 | Disposition: A | Discharge status: Home / Self Care | Payer: Medicare Other | Source: Ambulatory Visit

## 2018-07-14 ENCOUNTER — Encounter (HOSPITAL_COMMUNITY): Payer: Medicare Other

## 2018-07-25 ENCOUNTER — Emergency Department (HOSPITAL_COMMUNITY): Payer: Medicare Other

## 2018-07-25 ENCOUNTER — Inpatient Hospital Stay (HOSPITAL_COMMUNITY)
Admission: EM | Admit: 2018-07-25 | Discharge: 2018-08-02 | DRG: 291 | Disposition: A | Discharge status: Home / Self Care | Payer: Medicare Other | Attending: Internal Medicine | Admitting: Internal Medicine

## 2018-07-25 ENCOUNTER — Other Ambulatory Visit: Payer: Self-pay

## 2018-07-25 ENCOUNTER — Encounter (HOSPITAL_COMMUNITY): Payer: Self-pay

## 2018-07-25 DIAGNOSIS — F17210 Nicotine dependence, cigarettes, uncomplicated: Secondary | ICD-10-CM | POA: Diagnosis present

## 2018-07-25 DIAGNOSIS — Z7951 Long term (current) use of inhaled steroids: Secondary | ICD-10-CM

## 2018-07-25 DIAGNOSIS — G4733 Obstructive sleep apnea (adult) (pediatric): Secondary | ICD-10-CM | POA: Diagnosis present

## 2018-07-25 DIAGNOSIS — I110 Hypertensive heart disease with heart failure: Principal | ICD-10-CM | POA: Diagnosis present

## 2018-07-25 DIAGNOSIS — G9341 Metabolic encephalopathy: Secondary | ICD-10-CM | POA: Diagnosis present

## 2018-07-25 DIAGNOSIS — I5023 Acute on chronic systolic (congestive) heart failure: Secondary | ICD-10-CM | POA: Diagnosis present

## 2018-07-25 DIAGNOSIS — Z9114 Patient's other noncompliance with medication regimen: Secondary | ICD-10-CM

## 2018-07-25 DIAGNOSIS — J9621 Acute and chronic respiratory failure with hypoxia: Secondary | ICD-10-CM | POA: Diagnosis not present

## 2018-07-25 DIAGNOSIS — M1610 Unilateral primary osteoarthritis, unspecified hip: Secondary | ICD-10-CM | POA: Diagnosis present

## 2018-07-25 DIAGNOSIS — Z6836 Body mass index (BMI) 36.0-36.9, adult: Secondary | ICD-10-CM

## 2018-07-25 DIAGNOSIS — I4892 Unspecified atrial flutter: Secondary | ICD-10-CM | POA: Diagnosis not present

## 2018-07-25 DIAGNOSIS — I2510 Atherosclerotic heart disease of native coronary artery without angina pectoris: Secondary | ICD-10-CM | POA: Diagnosis present

## 2018-07-25 DIAGNOSIS — I272 Pulmonary hypertension, unspecified: Secondary | ICD-10-CM | POA: Diagnosis present

## 2018-07-25 DIAGNOSIS — J9601 Acute respiratory failure with hypoxia: Secondary | ICD-10-CM | POA: Diagnosis present

## 2018-07-25 DIAGNOSIS — Z79899 Other long term (current) drug therapy: Secondary | ICD-10-CM

## 2018-07-25 DIAGNOSIS — R0902 Hypoxemia: Secondary | ICD-10-CM | POA: Diagnosis not present

## 2018-07-25 DIAGNOSIS — I428 Other cardiomyopathies: Secondary | ICD-10-CM

## 2018-07-25 DIAGNOSIS — E785 Hyperlipidemia, unspecified: Secondary | ICD-10-CM | POA: Diagnosis present

## 2018-07-25 DIAGNOSIS — J9622 Acute and chronic respiratory failure with hypercapnia: Secondary | ICD-10-CM | POA: Diagnosis not present

## 2018-07-25 DIAGNOSIS — E1140 Type 2 diabetes mellitus with diabetic neuropathy, unspecified: Secondary | ICD-10-CM

## 2018-07-25 DIAGNOSIS — I509 Heart failure, unspecified: Secondary | ICD-10-CM | POA: Diagnosis not present

## 2018-07-25 DIAGNOSIS — J9690 Respiratory failure, unspecified, unspecified whether with hypoxia or hypercapnia: Secondary | ICD-10-CM

## 2018-07-25 DIAGNOSIS — Z7901 Long term (current) use of anticoagulants: Secondary | ICD-10-CM

## 2018-07-25 DIAGNOSIS — Z8249 Family history of ischemic heart disease and other diseases of the circulatory system: Secondary | ICD-10-CM

## 2018-07-25 DIAGNOSIS — J440 Chronic obstructive pulmonary disease with acute lower respiratory infection: Secondary | ICD-10-CM | POA: Diagnosis present

## 2018-07-25 DIAGNOSIS — Z9081 Acquired absence of spleen: Secondary | ICD-10-CM

## 2018-07-25 DIAGNOSIS — I429 Cardiomyopathy, unspecified: Secondary | ICD-10-CM | POA: Diagnosis present

## 2018-07-25 DIAGNOSIS — J189 Pneumonia, unspecified organism: Secondary | ICD-10-CM | POA: Diagnosis not present

## 2018-07-25 DIAGNOSIS — J449 Chronic obstructive pulmonary disease, unspecified: Secondary | ICD-10-CM | POA: Diagnosis present

## 2018-07-25 DIAGNOSIS — E876 Hypokalemia: Secondary | ICD-10-CM | POA: Diagnosis not present

## 2018-07-25 DIAGNOSIS — I480 Paroxysmal atrial fibrillation: Secondary | ICD-10-CM | POA: Diagnosis present

## 2018-07-25 DIAGNOSIS — R609 Edema, unspecified: Secondary | ICD-10-CM | POA: Diagnosis not present

## 2018-07-25 DIAGNOSIS — E875 Hyperkalemia: Secondary | ICD-10-CM | POA: Diagnosis present

## 2018-07-25 DIAGNOSIS — R0602 Shortness of breath: Secondary | ICD-10-CM | POA: Diagnosis not present

## 2018-07-25 DIAGNOSIS — J9602 Acute respiratory failure with hypercapnia: Secondary | ICD-10-CM

## 2018-07-25 DIAGNOSIS — R918 Other nonspecific abnormal finding of lung field: Secondary | ICD-10-CM | POA: Diagnosis not present

## 2018-07-25 DIAGNOSIS — N179 Acute kidney failure, unspecified: Secondary | ICD-10-CM | POA: Diagnosis not present

## 2018-07-25 DIAGNOSIS — Z794 Long term (current) use of insulin: Secondary | ICD-10-CM

## 2018-07-25 DIAGNOSIS — Z9981 Dependence on supplemental oxygen: Secondary | ICD-10-CM

## 2018-07-25 DIAGNOSIS — E7800 Pure hypercholesterolemia, unspecified: Secondary | ICD-10-CM | POA: Diagnosis present

## 2018-07-25 DIAGNOSIS — D509 Iron deficiency anemia, unspecified: Secondary | ICD-10-CM | POA: Diagnosis present

## 2018-07-25 DIAGNOSIS — G8929 Other chronic pain: Secondary | ICD-10-CM | POA: Diagnosis present

## 2018-07-25 DIAGNOSIS — I5022 Chronic systolic (congestive) heart failure: Secondary | ICD-10-CM | POA: Diagnosis present

## 2018-07-25 LAB — CBC WITH DIFFERENTIAL/PLATELET
Abs Immature Granulocytes: 0 K/uL (ref 0.0–0.1)
Basophils Absolute: 0 K/uL (ref 0.0–0.1)
Basophils Relative: 1 %
Eosinophils Absolute: 0 K/uL (ref 0.0–0.7)
Eosinophils Relative: 0 %
HCT: 34.5 % — ABNORMAL LOW (ref 39.0–52.0)
Hemoglobin: 9.5 g/dL — ABNORMAL LOW (ref 13.0–17.0)
Immature Granulocytes: 0 %
Lymphocytes Relative: 15 %
Lymphs Abs: 1.2 K/uL (ref 0.7–4.0)
MCH: 24.8 pg — ABNORMAL LOW (ref 26.0–34.0)
MCHC: 27.5 g/dL — ABNORMAL LOW (ref 30.0–36.0)
MCV: 90.1 fL (ref 78.0–100.0)
Monocytes Absolute: 0.8 K/uL (ref 0.1–1.0)
Monocytes Relative: 9 %
Neutro Abs: 6.1 K/uL (ref 1.7–7.7)
Neutrophils Relative %: 75 %
Platelets: 371 K/uL (ref 150–400)
RBC: 3.83 MIL/uL — ABNORMAL LOW (ref 4.22–5.81)
RDW: 18.3 % — ABNORMAL HIGH (ref 11.5–15.5)
WBC: 8.1 K/uL (ref 4.0–10.5)

## 2018-07-25 LAB — BASIC METABOLIC PANEL WITH GFR
Anion gap: 7 (ref 5–15)
BUN: 21 mg/dL (ref 8–23)
CO2: 28 mmol/L (ref 22–32)
Calcium: 8.5 mg/dL — ABNORMAL LOW (ref 8.9–10.3)
Chloride: 97 mmol/L — ABNORMAL LOW (ref 98–111)
Creatinine, Ser: 1.17 mg/dL (ref 0.61–1.24)
GFR calc Af Amer: >60 mL/min
GFR calc non Af Amer: >60 mL/min
Glucose, Bld: 161 mg/dL — ABNORMAL HIGH (ref 70–99)
Potassium: 5.6 mmol/L — ABNORMAL HIGH (ref 3.5–5.1)
Sodium: 132 mmol/L — ABNORMAL LOW (ref 135–145)

## 2018-07-25 LAB — I-STAT TROPONIN, ED: Troponin i, poc: 0.03 ng/mL (ref 0.00–0.08)

## 2018-07-25 LAB — BRAIN NATRIURETIC PEPTIDE: B Natriuretic Peptide: 2064.1 pg/mL — ABNORMAL HIGH (ref 0.0–100.0)

## 2018-07-25 NOTE — ED Provider Notes (Signed)
[facility] EMERGENCY DEPARTMENT Provider Note   CSN: [id number] Arrival date & time: 07/25/18  2144     History   Chief Complaint Chief Complaint  Patient presents with  . Shortness of Breath    HPI Nicholas Caldwell is a 72 y.o. male.  Patient is a 82-year-old male with a history of COPD, CHF, A. fib on Xarelto, hypertension and hyperlipidemia who presents with shortness of breath.  He has had worsening shortness of breath over the last 2 to 3 days.  He denies any chest pain.  He is coughing up some yellow sputum.  He has no documented fevers but has felt hot.  He has had some myalgias and flulike symptoms.  He has had some increased leg swelling.  No nausea or vomiting.  He is on oxygen at 3 L/min at home.  Per EMS he had oxygen saturations in the low 80s on his oxygen.  He was given a nebulizer treatment with no improvement in symptoms.  He was started on BiPAP and he has had improvement in symptoms on the BiPAP.     Past Medical History:  Diagnosis Date  . Atrial flutter ([place])    a. recurrent AFlutter with RVR;  b. Amiodarone Rx started 4/16  . CAD (coronary artery disease)    a. LHC 1/16:  mLAD diffuse disease, pLCx mild disease, dLCx with disease but too small for PCI, RCA ok, EF 25-30%  . Chronic pain   . Chronic systolic CHF (congestive heart failure) ([place])   . COPD (chronic obstructive pulmonary disease) ([place])   . Diabetes mellitus without complication ([place])   . Hypercholesteremia   . Hypertension   . NICM (nonischemic cardiomyopathy) ([place])    a.dx 2016. b. 2D echo 06/2016 - Last echo 07/01/16: mod dilated LV, mod LVH, EF 25-30%, mild-mod MR, sev LAE, mild-mod reduced RV systolic function, mild-mod TR, PASP 56mHG.  .Marland KitchenPAF (paroxysmal atrial fibrillation) (HCC)    On amio - ot a candidate for flecainide due to cardiomyopathy, not a candidate for Tikosyn due to prolonged QT, and felt to be a poor candidate for ablation given left atrial size.  . Prolonged Q-T  interval on ECG   . Pulmonary hypertension ([place]   . Tobacco abuse     Patient Active Problem List   Diagnosis Date Noted  . CHF exacerbation ([facility] 05/01/2018  . CHF (congestive heart failure) ([place] 04/30/2018  . Chronic respiratory failure with hypoxia ([place] 12/28/2017  . Atrial fibrillation with RVR ([facility]   . SVT (supraventricular tachycardia) ([facility]   . Torsades de pointes ([facility]   . COPD GOLD 0   . Medically noncompliant   . Panlobular emphysema ([facility]   . OSA (obstructive sleep apnea)   . Pulmonary hypertension ([place]   . Disorientation   . Pressure injury of skin 09/07/2016  . Skin lesion-left heal 09/05/2016  . Chronic systolic CHF (congestive heart failure) (HC[place] 07/16/2016  . COPD (chronic obstructive pulmonary disease) (HF[place] 07/16/2016  . Uncontrolled type 2 diabetes mellitus with complication (HDra[place]   . Diabetic polyneuropathy associated with diabetes mellitus due to underlying condition (HCamp[facility]ells[place]   . Normocytic anemia 06/29/2016  . CAD - Non-obstructive by LHC1/16 01/21/2016  . Prolonged QT interval 10/09/2015  . Nonischemic cardiomyopathy ([facility] 10/09/2015  . PAF (paroxysmal atrial fibrillation) ([facility]   . Chronic pain 02/19/2015  . Morbid obesity ([facility] 02/13/2015  . DOE (dyspnea on exertion)   . Elevated troponin I level 11/01/2014  . COPD exacerbation (  [place]) 11/01/2014  . Essential hypertension 10/31/2014  . Type 2 diabetes mellitus with neuropathy 10/31/2014  . Hyperlipidemia  10/31/2014  . Cigarette smoker 10/31/2014    Past Surgical History:  Procedure Laterality Date  . LEFT AND RIGHT HEART CATHETERIZATION WITH CORONARY ANGIOGRAM N/A 11/06/2014   Procedure: LEFT AND RIGHT HEART CATHETERIZATION WITH CORONARY ANGIOGRAM;  Surgeon: Jettie Booze, MD;  Location: [facility] CATH LAB;  Service: Cardiovascular;  Laterality: N/A;  . SPLENECTOMY          Home Medications    Prior to Admission medications   Medication Sig Start Date End Date Taking? Authorizing Provider    albuterol (PROVENTIL HFA;VENTOLIN HFA) 108 (90 BASE) MCG/ACT inhaler Inhale 2 puffs into the lungs every 6 (six) hours as needed for wheezing or shortness of breath. 11/07/14   Kinnie Feil, MD  budesonide-formoterol (SYMBICORT) 160-4.5 MCG/ACT inhaler Inhale 2 puffs into the lungs 2 (two) times daily.    [provider]  carvedilol (COREG) 12.5 MG tablet Take 1 tablet (12.5 mg total) by mouth 2 (two) times daily with a meal. 11/05/16   Regalado, Belkys A, MD  diazepam (VALIUM) 10 MG tablet Take 0.5 tablets (5 mg total) by mouth daily as needed for anxiety or sleep. Patient taking differently: Take 10 mg by mouth daily as needed for anxiety or sleep.  12/19/16   Theodis Blaze, MD  ferrous sulfate 325 (65 FE) MG tablet Take 325 mg by mouth 3 (three) times daily as needed (for supplementation).    [provider]  fluticasone (FLONASE) 50 MCG/ACT nasal spray Place 2 sprays into both nostrils daily as needed for allergies.     [provider]  folic acid (FOLVITE) 1 MG tablet Take 1 mg by mouth daily.    [provider]  gabapentin (NEURONTIN) 600 MG tablet Take 1 tablet (600 mg total) by mouth 2 (two) times daily. 12/19/16   Theodis Blaze, MD  glucose 4 GM chewable tablet Chew 4 tablets by mouth See admin instructions. Chew 4 tablets by mouth as needed for low blood sugar - repeat every 15 minutes if blood sugar less than 70    [provider]  guaifenesin (HUMIBID E) 400 MG TABS tablet Take 400 mg by mouth 3 (three) times daily.    [provider]  hydrocortisone (ANUSOL-HC) 2.5 % rectal cream Place 1 application rectally 2 (two) times daily as needed for hemorrhoids or itching. Use up to 2 weeks at a time as needed    [provider]  hydrocortisone 1 % lotion Apply 1 application topically See admin instructions. Apply small amount to back twice daily for itchy rash    [provider]  insulin aspart protamine - aspart (NOVOLOG  MIX 70/30 FLEXPEN) (70-30) 100 UNIT/ML FlexPen Inject 30 Units into the skin 2 (two) times daily.     [provider]  ipratropium (ATROVENT) 0.02 % nebulizer solution Take 2.5 mLs (0.5 mg total) by nebulization every 6 (six) hours as needed for wheezing or shortness of breath. 12/19/16   Theodis Blaze, MD  ipratropium-albuterol (DUONEB) 0.5-2.5 (3) MG/3ML SOLN Take 3 mLs by nebulization 2 (two) times daily.    [provider]  latanoprost (XALATAN) 0.005 % ophthalmic solution Place 1 drop into both eyes at bedtime.    [provider]  lisinopril (PRINIVIL,ZESTRIL) 20 MG tablet Take 1 tablet (20 mg total) by mouth daily. 05/27/18   Georgiana Shore, NP  Magnesium Oxide 420 (  252 Mg) MG TABS Take 420 mg by mouth 2 (two) times daily.    [provider]  metFORMIN (GLUCOPHAGE-XR) 500 MG 24 hr tablet Take 1,000 mg by mouth 2 (two) times daily with a meal.    [provider]  oxyCODONE-acetaminophen (PERCOCET) 10-325 MG tablet Take 1 tablet by mouth every 6 (six) hours as needed for pain.    [provider]  OXYGEN Inhale 3 L into the lungs continuous.     [provider]  potassium chloride SA (K-DUR,KLOR-CON) 20 MEQ tablet Take 2 tablets (40 mEq total) by mouth daily. 11/07/14   Kinnie Feil, MD  rivaroxaban (XARELTO) 20 MG TABS tablet Take 1 tablet (20 mg total) by mouth daily with supper. Patient taking differently: Take 20 mg by mouth daily at 6 PM.  01/20/17   Jettie Booze, MD  rosuvastatin (CRESTOR) 20 MG tablet Take 1 tablet (20 mg total) by mouth daily. 05/07/18   Matilde Haymaker, MD  spironolactone (ALDACTONE) 25 MG tablet Take 1 tablet (25 mg total) by mouth at bedtime. 02/04/18 02/04/19  Shirley Friar, PA-C  torsemide (DEMADEX) 20 MG tablet Take 2 tablets (40 mg total) by mouth 2 (two) times daily. 08/06/17   Shirley Friar, PA-C    Family History Family History  Problem Relation Age of Onset  . Heart disease  Mother   . Hypertension Mother   . Heart failure Mother   . Heart disease Father   . Hypertension Sister   . Heart attack Neg Hx   . Stroke Neg Hx     Social History Social History   Tobacco Use  . Smoking status: Current Every Day Smoker    Packs/day: 1.00    Years: 34.00    Pack years: 34.00    Types: Cigarettes  . Smokeless tobacco: Never Used  Substance Use Topics  . Alcohol use: No    Alcohol/week: 0.0 standard drinks  . Drug use: No     Allergies   Patient has no known allergies.   Review of Systems Review of Systems  Constitutional: Positive for fatigue. Negative for chills, diaphoresis and fever.  HENT: Negative for congestion, rhinorrhea and sneezing.   Eyes: Negative.   Respiratory: Positive for cough and shortness of breath. Negative for chest tightness.   Cardiovascular: Positive for leg swelling. Negative for chest pain.  Gastrointestinal: Negative for abdominal pain, blood in stool, diarrhea, nausea and vomiting.  Genitourinary: Negative for difficulty urinating, flank pain, frequency and hematuria.  Musculoskeletal: Positive for myalgias. Negative for arthralgias and back pain.  Skin: Negative for rash.  Neurological: Negative for dizziness, speech difficulty, weakness, numbness and headaches.     Physical Exam Updated Vital Signs BP 119/63   Pulse 74   Temp 98.2 F (36.8 C) (Axillary)   Resp (!) 25   Ht _0  (1.88 m)   Wt 117.9 kg   SpO2 97%   BMI 33.38 kg/m   Physical Exam  Constitutional: He is oriented to person, place, and time. He appears well-developed and well-nourished. He appears distressed.  HENT:  Head: Normocephalic and atraumatic.  Eyes: Pupils are equal, round, and reactive to light.  Neck: Normal range of motion. Neck supple.  Cardiovascular: Normal rate, regular rhythm and normal heart sounds.  Pulmonary/Chest: Tachypnea noted. He is in respiratory distress. He has decreased breath sounds. He has no wheezes. He has  rales. He exhibits no tenderness.  Abdominal: Soft. Bowel sounds are normal. There is no  tenderness. There is no rebound and no guarding.  Musculoskeletal: Normal range of motion.       Right lower leg: He exhibits edema.       Left lower leg: He exhibits edema.  Lymphadenopathy:    He has no cervical adenopathy.  Neurological: He is alert and oriented to person, place, and time.  Skin: Skin is warm and dry. No rash noted.  Psychiatric: He has a normal mood and affect.     ED Treatments / Results  Labs (all labs ordered are listed, but only abnormal results are displayed) Labs Reviewed  BASIC METABOLIC PANEL - Abnormal; Notable for the following components:      Result Value   Sodium 132 (*)    Potassium 5.6 (*)    Chloride 97 (*)    Glucose, Bld 161 (*)    Calcium 8.5 (*)    All other components within normal limits  CBC WITH DIFFERENTIAL/PLATELET - Abnormal; Notable for the following components:   RBC 3.83 (*)    Hemoglobin 9.5 (*)    HCT 34.5 (*)    MCH 24.8 (*)    MCHC 27.5 (*)    RDW 18.3 (*)    All other components within normal limits  BRAIN NATRIURETIC PEPTIDE - Abnormal; Notable for the following components:   B Natriuretic Peptide 2,064.1 (*)    All other components within normal limits  I-STAT TROPONIN, ED    EKG EKG Interpretation  Date/Time:  Sunday July 25 2018 21:50:37 EDT Ventricular Rate:  83 PR Interval:    QRS Duration: 108 QT Interval:  414 QTC Calculation: 487 R Axis:   119 Text Interpretation:  Sinus rhythm Borderline prolonged PR interval Probable right ventricular hypertrophy Borderline T abnormalities, anterior leads Borderline prolonged QT interval since last tracing no significant change Confirmed by Malvin Johns [phone]) on 07/25/2018 10:31:15 PM   Radiology Dg Chest Port 1 View  Result Date: 07/25/2018 CLINICAL DATA:  Dyspnea EXAM: PORTABLE CHEST 1 VIEW COMPARISON:  05/02/2018 chest radiograph. FINDINGS: Stable cardiomediastinal  silhouette with moderate cardiomegaly. No pneumothorax. Small bilateral pleural effusions, left greater than right. Mild pulmonary edema. Bibasilar hazy opacity. IMPRESSION: 1. Moderate cardiomegaly with mild pulmonary edema and small bilateral pleural effusions, left greater than right, compatible with mild congestive heart failure. 2. Hazy bibasilar lung opacities, favor atelectasis. Electronically Signed   By: Ilona Sorrel M.D.   On: 07/25/2018 22:06    Procedures Procedures (including critical care time)  Medications Ordered in ED Medications - No data to display   Initial Impression / Assessment and Plan / ED Course  I have reviewed the triage vital signs and the nursing notes.  Pertinent labs & imaging results that were available during my care of the patient were reviewed by me and considered in my medical decision making (see chart for details).     Patient is a 72 year old male who presents with shortness of breath.  He has no associated chest pain.  No ischemic changes on EKG.  He was hypoxic per EMS and has been on BiPAP since arrival.  His chest x-ray shows evidence of fluid overload.  His BNP is markedly elevated.  His troponin is negative.  There is no evidence of pneumonia on x-ray.  He was given a dose of Lasix and is feeling much better.  Will try to transition off the BiPAP.  His other labs are non-concerning.  His potassium is slightly elevated but should come down with a diuresis.  His  hemoglobin is low at 9.5 but his prior hemoglobins have been ranging between 9 and 10.   I spoke with Dr. Hal Hope who will admit the pt.  CRITICAL CARE Performed by: Malvin Johns Total critical care time: 45 minutes Critical care time was exclusive of separately billable procedures and treating other patients. Critical care was necessary to treat or prevent imminent or life-threatening deterioration. Critical care was time spent personally by me on the following activities: development  of treatment plan with patient and/or surrogate as well as nursing, discussions with consultants, evaluation of patient's response to treatment, examination of patient, obtaining history from patient or surrogate, ordering and performing treatments and interventions, ordering and review of laboratory studies, ordering and review of radiographic studies, pulse oximetry and re-evaluation of patient's condition.   Final Clinical Impressions(s) / ED Diagnoses   Final diagnoses:  Acute on chronic congestive heart failure, unspecified heart failure type [facility])    ED Discharge Orders    None       Malvin Johns, MD 07/25/18 2344

## 2018-07-25 NOTE — Progress Notes (Signed)
RT placed patient on BIPAP. Patient tolerating well at this time.

## 2018-07-25 NOTE — ED Triage Notes (Signed)
Pt brought in by [facility] from home for SOB and bilateral leg edema x3-4 days. Pt has hx of COPD and CHF. Per EMS pt noncompliant with home regimens, pt is a current every day smoker. Pt wears 3L chronically. Pt on CPAP on arrival- states feels much better. Pt initial O2 84% on EMS arrival, 98% on CPAP. Pt A+Ox4, EDP at bedside.

## 2018-07-26 ENCOUNTER — Encounter (HOSPITAL_COMMUNITY): Payer: Self-pay | Admitting: Internal Medicine

## 2018-07-26 DIAGNOSIS — I483 Typical atrial flutter: Secondary | ICD-10-CM | POA: Diagnosis not present

## 2018-07-26 DIAGNOSIS — Z9981 Dependence on supplemental oxygen: Secondary | ICD-10-CM | POA: Diagnosis not present

## 2018-07-26 DIAGNOSIS — I4892 Unspecified atrial flutter: Secondary | ICD-10-CM | POA: Diagnosis not present

## 2018-07-26 DIAGNOSIS — J9602 Acute respiratory failure with hypercapnia: Secondary | ICD-10-CM | POA: Diagnosis not present

## 2018-07-26 DIAGNOSIS — E875 Hyperkalemia: Secondary | ICD-10-CM

## 2018-07-26 DIAGNOSIS — Z7951 Long term (current) use of inhaled steroids: Secondary | ICD-10-CM | POA: Diagnosis not present

## 2018-07-26 DIAGNOSIS — I5022 Chronic systolic (congestive) heart failure: Secondary | ICD-10-CM | POA: Diagnosis not present

## 2018-07-26 DIAGNOSIS — J440 Chronic obstructive pulmonary disease with acute lower respiratory infection: Secondary | ICD-10-CM | POA: Diagnosis present

## 2018-07-26 DIAGNOSIS — I509 Heart failure, unspecified: Secondary | ICD-10-CM

## 2018-07-26 DIAGNOSIS — J9601 Acute respiratory failure with hypoxia: Secondary | ICD-10-CM

## 2018-07-26 DIAGNOSIS — J9690 Respiratory failure, unspecified, unspecified whether with hypoxia or hypercapnia: Secondary | ICD-10-CM | POA: Diagnosis not present

## 2018-07-26 DIAGNOSIS — I110 Hypertensive heart disease with heart failure: Secondary | ICD-10-CM | POA: Diagnosis present

## 2018-07-26 DIAGNOSIS — Z8249 Family history of ischemic heart disease and other diseases of the circulatory system: Secondary | ICD-10-CM | POA: Diagnosis not present

## 2018-07-26 DIAGNOSIS — I5043 Acute on chronic combined systolic (congestive) and diastolic (congestive) heart failure: Secondary | ICD-10-CM | POA: Diagnosis not present

## 2018-07-26 DIAGNOSIS — Z794 Long term (current) use of insulin: Secondary | ICD-10-CM | POA: Diagnosis not present

## 2018-07-26 DIAGNOSIS — F17210 Nicotine dependence, cigarettes, uncomplicated: Secondary | ICD-10-CM | POA: Diagnosis present

## 2018-07-26 DIAGNOSIS — I5023 Acute on chronic systolic (congestive) heart failure: Secondary | ICD-10-CM | POA: Diagnosis present

## 2018-07-26 DIAGNOSIS — J9621 Acute and chronic respiratory failure with hypoxia: Secondary | ICD-10-CM | POA: Diagnosis present

## 2018-07-26 DIAGNOSIS — J42 Unspecified chronic bronchitis: Secondary | ICD-10-CM

## 2018-07-26 DIAGNOSIS — N179 Acute kidney failure, unspecified: Secondary | ICD-10-CM | POA: Diagnosis present

## 2018-07-26 DIAGNOSIS — G9341 Metabolic encephalopathy: Secondary | ICD-10-CM | POA: Diagnosis present

## 2018-07-26 DIAGNOSIS — I480 Paroxysmal atrial fibrillation: Secondary | ICD-10-CM | POA: Diagnosis not present

## 2018-07-26 DIAGNOSIS — E1140 Type 2 diabetes mellitus with diabetic neuropathy, unspecified: Secondary | ICD-10-CM | POA: Diagnosis present

## 2018-07-26 DIAGNOSIS — J189 Pneumonia, unspecified organism: Secondary | ICD-10-CM | POA: Diagnosis not present

## 2018-07-26 DIAGNOSIS — E785 Hyperlipidemia, unspecified: Secondary | ICD-10-CM | POA: Diagnosis present

## 2018-07-26 DIAGNOSIS — J90 Pleural effusion, not elsewhere classified: Secondary | ICD-10-CM | POA: Diagnosis not present

## 2018-07-26 DIAGNOSIS — E7800 Pure hypercholesterolemia, unspecified: Secondary | ICD-10-CM | POA: Diagnosis present

## 2018-07-26 DIAGNOSIS — J9622 Acute and chronic respiratory failure with hypercapnia: Secondary | ICD-10-CM | POA: Diagnosis present

## 2018-07-26 DIAGNOSIS — Z79899 Other long term (current) drug therapy: Secondary | ICD-10-CM | POA: Diagnosis not present

## 2018-07-26 DIAGNOSIS — R0602 Shortness of breath: Secondary | ICD-10-CM | POA: Diagnosis not present

## 2018-07-26 DIAGNOSIS — I429 Cardiomyopathy, unspecified: Secondary | ICD-10-CM | POA: Diagnosis present

## 2018-07-26 DIAGNOSIS — I2510 Atherosclerotic heart disease of native coronary artery without angina pectoris: Secondary | ICD-10-CM | POA: Diagnosis present

## 2018-07-26 DIAGNOSIS — I272 Pulmonary hypertension, unspecified: Secondary | ICD-10-CM | POA: Diagnosis present

## 2018-07-26 DIAGNOSIS — G4733 Obstructive sleep apnea (adult) (pediatric): Secondary | ICD-10-CM | POA: Diagnosis present

## 2018-07-26 DIAGNOSIS — Z7901 Long term (current) use of anticoagulants: Secondary | ICD-10-CM | POA: Diagnosis not present

## 2018-07-26 LAB — PROTIME-INR
INR: 1.56
Prothrombin Time: 18.5 seconds — ABNORMAL HIGH (ref 11.4–15.2)

## 2018-07-26 LAB — POCT I-STAT 3, ART BLOOD GAS (G3+)
Acid-Base Excess: 7 mmol/L — ABNORMAL HIGH (ref 0.0–2.0)
Bicarbonate: 34 mmol/L — ABNORMAL HIGH (ref 20.0–28.0)
O2 Saturation: 97 %
PCO2 ART: 59.8 mmHg — AB (ref 32.0–48.0)
PH ART: 7.363 (ref 7.350–7.450)
Patient temperature: 98.6
TCO2: 36 mmol/L — ABNORMAL HIGH (ref 22–32)
pO2, Arterial: 101 mmHg (ref 83.0–108.0)

## 2018-07-26 LAB — CBC
HEMATOCRIT: 32.7 % — AB (ref 39.0–52.0)
HEMOGLOBIN: 9.3 g/dL — AB (ref 13.0–17.0)
MCH: 25 pg — ABNORMAL LOW (ref 26.0–34.0)
MCHC: 28.4 g/dL — AB (ref 30.0–36.0)
MCV: 87.9 fL (ref 78.0–100.0)
Platelets: 366 10*3/uL (ref 150–400)
RBC: 3.72 MIL/uL — ABNORMAL LOW (ref 4.22–5.81)
RDW: 18.3 % — AB (ref 11.5–15.5)
WBC: 5.7 10*3/uL (ref 4.0–10.5)

## 2018-07-26 LAB — BASIC METABOLIC PANEL
ANION GAP: 11 (ref 5–15)
ANION GAP: 11 (ref 5–15)
Anion gap: 11 (ref 5–15)
Anion gap: 11 (ref 5–15)
BUN: 21 mg/dL (ref 8–23)
BUN: 21 mg/dL (ref 8–23)
BUN: 22 mg/dL (ref 8–23)
BUN: 22 mg/dL (ref 8–23)
CALCIUM: 8.7 mg/dL — AB (ref 8.9–10.3)
CHLORIDE: 92 mmol/L — AB (ref 98–111)
CHLORIDE: 95 mmol/L — AB (ref 98–111)
CO2: 30 mmol/L (ref 22–32)
CO2: 30 mmol/L (ref 22–32)
CO2: 30 mmol/L (ref 22–32)
CO2: 35 mmol/L — AB (ref 22–32)
Calcium: 8.5 mg/dL — ABNORMAL LOW (ref 8.9–10.3)
Calcium: 8.8 mg/dL — ABNORMAL LOW (ref 8.9–10.3)
Calcium: 8.7 mg/dL — ABNORMAL LOW (ref 8.9–10.3)
Chloride: 95 mmol/L — ABNORMAL LOW (ref 98–111)
Chloride: 96 mmol/L — ABNORMAL LOW (ref 98–111)
Creatinine, Ser: 1.15 mg/dL (ref 0.61–1.24)
Creatinine, Ser: 1.06 mg/dL (ref 0.61–1.24)
Creatinine, Ser: 1.08 mg/dL (ref 0.61–1.24)
Creatinine, Ser: 0.97 mg/dL (ref 0.61–1.24)
GFR calc Af Amer: >60 mL/min (ref >60)
GFR calc Af Amer: >60 mL/min (ref >60)
GFR calc Af Amer: >60 mL/min (ref >60)
GFR calc non Af Amer: >60 mL/min (ref >60)
GFR calc non Af Amer: >60 mL/min (ref >60)
GLUCOSE: 148 mg/dL — AB (ref 70–99)
GLUCOSE: 139 mg/dL — AB (ref 70–99)
Glucose, Bld: 119 mg/dL — ABNORMAL HIGH (ref 70–99)
Glucose, Bld: 147 mg/dL — ABNORMAL HIGH (ref 70–99)
POTASSIUM: 5.5 mmol/L — AB (ref 3.5–5.1)
POTASSIUM: 5.8 mmol/L — AB (ref 3.5–5.1)
Potassium: 5.7 mmol/L — ABNORMAL HIGH (ref 3.5–5.1)
Potassium: 5.1 mmol/L (ref 3.5–5.1)
SODIUM: 136 mmol/L (ref 135–145)
SODIUM: 136 mmol/L (ref 135–145)
Sodium: 137 mmol/L (ref 135–145)
Sodium: 138 mmol/L (ref 135–145)

## 2018-07-26 LAB — I-STAT ARTERIAL BLOOD GAS, ED
ACID-BASE EXCESS: 6 mmol/L — AB (ref 0.0–2.0)
Acid-Base Excess: 5 mmol/L — ABNORMAL HIGH (ref 0.0–2.0)
Acid-Base Excess: 7 mmol/L — ABNORMAL HIGH (ref 0.0–2.0)
BICARBONATE: 35.2 mmol/L — AB (ref 20.0–28.0)
BICARBONATE: 35.3 mmol/L — AB (ref 20.0–28.0)
Bicarbonate: 34 mmol/L — ABNORMAL HIGH (ref 20.0–28.0)
O2 SAT: 57 %
O2 SAT: 92 %
O2 Saturation: 94 %
PCO2 ART: 70.2 mmHg — AB (ref 32.0–48.0)
PO2 ART: 37 mmHg — AB (ref 83.0–108.0)
PO2 ART: 74 mmHg — AB (ref 83.0–108.0)
TCO2: 36 mmol/L — AB (ref 22–32)
TCO2: 37 mmol/L — AB (ref 22–32)
TCO2: 38 mmol/L — AB (ref 22–32)
pCO2 arterial: 73.1 mmHg (ref 32.0–48.0)
pCO2 arterial: 82.8 mmHg (ref 32.0–48.0)
pH, Arterial: 7.237 — ABNORMAL LOW (ref 7.350–7.450)
pH, Arterial: 7.276 — ABNORMAL LOW (ref 7.350–7.450)
pH, Arterial: 7.309 — ABNORMAL LOW (ref 7.350–7.450)
pO2, Arterial: 83 mmHg (ref 83.0–108.0)

## 2018-07-26 LAB — TSH: TSH: 0.841 u[IU]/mL (ref 0.350–4.500)

## 2018-07-26 LAB — GLUCOSE, CAPILLARY
GLUCOSE-CAPILLARY: 147 mg/dL — AB (ref 70–99)
GLUCOSE-CAPILLARY: 94 mg/dL (ref 70–99)
Glucose-Capillary: 137 mg/dL — ABNORMAL HIGH (ref 70–99)
Glucose-Capillary: 144 mg/dL — ABNORMAL HIGH (ref 70–99)
Glucose-Capillary: 121 mg/dL — ABNORMAL HIGH (ref 70–99)

## 2018-07-26 LAB — MAGNESIUM
MAGNESIUM: 2.3 mg/dL (ref 1.7–2.4)
Magnesium: 2.1 mg/dL (ref 1.7–2.4)
Magnesium: 2.1 mg/dL (ref 1.7–2.4)

## 2018-07-26 LAB — TROPONIN I
TROPONIN I: 0.04 ng/mL — AB (ref <0.03)
TROPONIN I: 0.04 ng/mL — AB (ref <0.03)
Troponin I: <0.03 ng/mL (ref <0.03)

## 2018-07-26 LAB — APTT
APTT: 48 s — AB (ref 24–36)
APTT: 109 s — AB (ref 24–36)
aPTT: 45 seconds — ABNORMAL HIGH (ref 24–36)

## 2018-07-26 LAB — HEPARIN LEVEL (UNFRACTIONATED): HEPARIN UNFRACTIONATED: 1.18 [IU]/mL — AB (ref 0.30–0.70)

## 2018-07-26 LAB — HEPATIC FUNCTION PANEL
ALBUMIN: 3 g/dL — AB (ref 3.5–5.0)
ALT: 9 U/L (ref 0–44)
AST: 15 U/L (ref 15–41)
Alkaline Phosphatase: 95 U/L (ref 38–126)
BILIRUBIN INDIRECT: 0.4 mg/dL (ref 0.3–0.9)
Bilirubin, Direct: 0.1 mg/dL (ref 0.0–0.2)
TOTAL PROTEIN: 7.1 g/dL (ref 6.5–8.1)
Total Bilirubin: 0.5 mg/dL (ref 0.3–1.2)

## 2018-07-26 LAB — PHOSPHORUS: PHOSPHORUS: 4.7 mg/dL — AB (ref 2.5–4.6)

## 2018-07-26 LAB — AMMONIA: AMMONIA: 42 umol/L — AB (ref 9–35)

## 2018-07-26 LAB — MRSA PCR SCREENING: MRSA by PCR: NEGATIVE

## 2018-07-26 MED ORDER — KETOROLAC TROMETHAMINE 15 MG/ML IJ SOLN
15.0000 mg | Freq: Once | INTRAMUSCULAR | Status: AC
  Administered 2018-07-26: 15 mg via INTRAVENOUS
  Filled 2018-07-26: qty 1

## 2018-07-26 MED ORDER — ALBUTEROL SULFATE (2.5 MG/3ML) 0.083% IN NEBU: 2.5000 mg | INHALATION_SOLUTION | RESPIRATORY_TRACT | Status: DC

## 2018-07-26 MED ORDER — IPRATROPIUM BROMIDE 0.02 % IN SOLN: 0.5000 mg | RESPIRATORY_TRACT | Status: DC

## 2018-07-26 MED ORDER — ARFORMOTEROL TARTRATE 15 MCG/2ML IN NEBU
15.0000 ug | INHALATION_SOLUTION | Freq: Two times a day (BID) | RESPIRATORY_TRACT | Status: DC
  Administered 2018-07-26 – 2018-08-02 (×14): 15 ug via RESPIRATORY_TRACT
  Filled 2018-07-26 (×15): qty 2

## 2018-07-26 MED ORDER — ACETAMINOPHEN 10 MG/ML IV SOLN
1000.0000 mg | Freq: Four times a day (QID) | INTRAVENOUS | Status: AC | PRN
  Filled 2018-07-26: qty 100

## 2018-07-26 MED ORDER — ALBUTEROL SULFATE (2.5 MG/3ML) 0.083% IN NEBU
2.5000 mg | INHALATION_SOLUTION | RESPIRATORY_TRACT | Status: DC | PRN
  Administered 2018-07-26: 2.5 mg via RESPIRATORY_TRACT
  Filled 2018-07-26: qty 3

## 2018-07-26 MED ORDER — ORAL CARE MOUTH RINSE
15.0000 mL | Freq: Two times a day (BID) | OROMUCOSAL | Status: DC
  Administered 2018-07-26 – 2018-08-01 (×11): 15 mL via OROMUCOSAL

## 2018-07-26 MED ORDER — FUROSEMIDE 10 MG/ML IJ SOLN: 60.0000 mg | Freq: Three times a day (TID) | INTRAMUSCULAR | Status: DC

## 2018-07-26 MED ORDER — BUDESONIDE 0.5 MG/2ML IN SUSP
0.5000 mg | Freq: Two times a day (BID) | RESPIRATORY_TRACT | Status: DC
  Administered 2018-07-26 – 2018-08-02 (×14): 0.5 mg via RESPIRATORY_TRACT
  Filled 2018-07-26 (×15): qty 2

## 2018-07-26 MED ORDER — HYDRALAZINE HCL 20 MG/ML IJ SOLN: 10.0000 mg | INTRAMUSCULAR | Status: DC | PRN

## 2018-07-26 MED ORDER — HEPARIN (PORCINE) IN NACL 100-0.45 UNIT/ML-% IJ SOLN
1500.0000 [IU]/h | INTRAMUSCULAR | Status: AC
  Administered 2018-07-26 – 2018-07-27 (×3): 1500 [IU]/h via INTRAVENOUS
  Filled 2018-07-26 (×3): qty 250

## 2018-07-26 MED ORDER — INSULIN ASPART 100 UNIT/ML ~~LOC~~ SOLN: 0.0000 [IU] | SUBCUTANEOUS | Status: DC

## 2018-07-26 MED ORDER — ACETAMINOPHEN 650 MG RE SUPP: 650.0000 mg | Freq: Four times a day (QID) | RECTAL | Status: DC | PRN

## 2018-07-26 MED ORDER — BUDESONIDE 0.25 MG/2ML IN SUSP: 0.2500 mg | Freq: Two times a day (BID) | RESPIRATORY_TRACT | Status: DC

## 2018-07-26 MED ORDER — SODIUM CHLORIDE 0.9 % IV SOLN
250.0000 mL | INTRAVENOUS | Status: DC | PRN
  Administered 2018-07-26 – 2018-07-27 (×2): 250 mL via INTRAVENOUS

## 2018-07-26 MED ORDER — CHLORHEXIDINE GLUCONATE 0.12 % MT SOLN
15.0000 mL | Freq: Two times a day (BID) | OROMUCOSAL | Status: DC
  Administered 2018-07-26 – 2018-08-02 (×15): 15 mL via OROMUCOSAL
  Filled 2018-07-26 (×14): qty 15

## 2018-07-26 MED ORDER — LATANOPROST 0.005 % OP SOLN
1.0000 [drp] | Freq: Every day | OPHTHALMIC | Status: DC
  Administered 2018-07-26 – 2018-08-01 (×6): 1 [drp] via OPHTHALMIC
  Filled 2018-07-26 (×2): qty 2.5

## 2018-07-26 MED ORDER — ACETAMINOPHEN 650 MG RE SUPP: 650.0000 mg | Freq: Four times a day (QID) | RECTAL | Status: AC | PRN

## 2018-07-26 MED ORDER — METHYLPREDNISOLONE SODIUM SUCC 40 MG IJ SOLR
40.0000 mg | Freq: Two times a day (BID) | INTRAMUSCULAR | Status: DC
  Administered 2018-07-26: 40 mg via INTRAVENOUS
  Filled 2018-07-26: qty 1

## 2018-07-26 MED ORDER — FUROSEMIDE 10 MG/ML IJ SOLN
40.0000 mg | Freq: Two times a day (BID) | INTRAMUSCULAR | Status: DC
  Administered 2018-07-26 (×2): 40 mg via INTRAVENOUS
  Filled 2018-07-26 (×2): qty 4

## 2018-07-26 MED ORDER — ACETAMINOPHEN 325 MG PO TABS: 650.0000 mg | ORAL_TABLET | Freq: Four times a day (QID) | ORAL | Status: DC | PRN

## 2018-07-26 MED ORDER — METHYLPREDNISOLONE SODIUM SUCC 125 MG IJ SOLR: 60.0000 mg | Freq: Four times a day (QID) | INTRAMUSCULAR | Status: DC

## 2018-07-26 MED ORDER — INSULIN ASPART 100 UNIT/ML ~~LOC~~ SOLN
2.0000 [IU] | SUBCUTANEOUS | Status: DC
  Administered 2018-07-26 (×2): 2 [IU] via SUBCUTANEOUS
  Administered 2018-07-27: 4 [IU] via SUBCUTANEOUS
  Administered 2018-07-27: 2 [IU] via SUBCUTANEOUS
  Administered 2018-07-28 (×2): 4 [IU] via SUBCUTANEOUS

## 2018-07-26 MED ORDER — FAMOTIDINE IN NACL 20-0.9 MG/50ML-% IV SOLN
20.0000 mg | Freq: Two times a day (BID) | INTRAVENOUS | Status: DC
  Administered 2018-07-26 – 2018-07-28 (×5): 20 mg via INTRAVENOUS
  Filled 2018-07-26 (×5): qty 50

## 2018-07-26 MED ORDER — FUROSEMIDE 10 MG/ML IJ SOLN
80.0000 mg | Freq: Two times a day (BID) | INTRAMUSCULAR | Status: DC
  Administered 2018-07-26 – 2018-08-02 (×13): 80 mg via INTRAVENOUS
  Filled 2018-07-26 (×14): qty 8

## 2018-07-26 NOTE — Progress Notes (Signed)
ANTICOAGULATION CONSULT NOTE - Follow Up Consult  Pharmacy Consult for heparin Indication: atrial fibrillation  No Known Allergies  Patient Measurements: Height: 6' 2" (188 cm) Weight: 259 lb 14.8 oz (117.9 kg) IBW/kg (Calculated) : 82.2 Heparin Dosing Weight: 110kg  Vital Signs: Temp: 98.8 F (37.1 C) (09/23 0753) Temp Source: Axillary (09/23 0753) BP: 115/58 (09/23 0900) Pulse Rate: 68 (09/23 0900)  Labs: Recent Labs    07/25/18 2159 07/26/18 0059 07/26/18 0440 07/26/18 0631  HGB 9.5*  --   --  9.3*  HCT 34.5*  --   --  32.7*  PLT 371  --   --  366  APTT  --   --  45* 48*  LABPROT  --   --  18.5*  --   INR  --   --  1.56  --   HEPARINUNFRC  --   --   --  1.18*  CREATININE 1.17 1.15 1.06 1.08  TROPONINI  --  0.04*  --  0.04*    Estimated Creatinine Clearance: 86.9 mL/min (by C-G formula based on SCr of 1.08 mg/dL).   Medical History: Past Medical History:  Diagnosis Date  . Atrial flutter ([place])    a. recurrent AFlutter with RVR;  b. Amiodarone Rx started 4/16  . CAD (coronary artery disease)    a. LHC 1/16:  mLAD diffuse disease, pLCx mild disease, dLCx with disease but too small for PCI, RCA ok, EF 25-30%  . Chronic pain   . Chronic systolic CHF (congestive heart failure) ([place])   . COPD (chronic obstructive pulmonary disease) ([place])   . Diabetes mellitus without complication ([place])   . Hypercholesteremia   . Hypertension   . NICM (nonischemic cardiomyopathy) ([place])    a.dx 2016. b. 2D echo 06/2016 - Last echo 07/01/16: mod dilated LV, mod LVH, EF 25-30%, mild-mod MR, sev LAE, mild-mod reduced RV systolic function, mild-mod TR, PASP 66mHG.  .Marland KitchenPAF (paroxysmal atrial fibrillation) (HCC)    On amio - ot a candidate for flecainide due to cardiomyopathy, not a candidate for Tikosyn due to prolonged QT, and felt to be a poor candidate for ablation given left atrial size.  . Pulmonary hypertension ([place]rev[place]   . Tobacco abuse     Assessment: [id number] c/o SOB, admitted  for acute respiratory failure w/ hypoxia and hypercapnia, to transition from Xarelto PTA to heparin for Hx PAF/Aflutter; last dose of Xarelto taken 9/21 at 1800.  Heparin drip 1500 uts/hr started last pm, HL 1.18 - falsley elevated from rivaroxaban, aptt 48 sec - < goal.  No bleeding noted, CBC ok   Goal of Therapy:  Heparin level 0.3-0.7 units/ml aPTT 66-102 seconds Monitor platelets by anticoagulation protocol: Yes   Plan:  Increase  heparin gtt 1700 units/hr  Check aptt in 6hr monitor heparin levels, aPTT (while Xarelto affects anti-Xa), and CBC.  LBonnita NasutiPharm.D. CPP, BCPS Clinical Pharmacist [phone]/23/2019 10:34 AM

## 2018-07-26 NOTE — ED Notes (Signed)
Intensivist at bedside before patient is able to get out of room-Monique,RN

## 2018-07-26 NOTE — Progress Notes (Signed)
RT increased pt IPAP on the BIPAP to 18 from 16 and increased the rate as requested by MD after ABG. Pt very lethargic at this time. ABG results Ph 7.28 PCO2 73.1 (critical value MD notified) HCO3 34 PaO2 83  RT will continue to monitor.

## 2018-07-26 NOTE — H&P (Signed)
History and Physical    Nicholas Caldwell MVH:[id number] DOB: Jul 08, 1946 DOA: 07/25/2018  PCP: [facility]  Patient coming from: Home.  Chief Complaint: Shortness of breath.  HPI: Nicholas Caldwell is a 72 y.o. male with history of systolic CHF last EF measured was 40 to 45% in June 2019, COPD, atrial fibrillation, morbid obesity, diabetes mellitus, chronic anemia and hyperlipidemia was brought to the ER after patient was getting increasingly short of breath and increasing peripheral edema over the last few days.  As per the patient's wife who provided most of the history patient has not been compliant with his medications including his diuretics last few days.  Has not complained of any chest pain nausea vomiting abdominal pain fever chills or any diarrhea.  ED Course: In the ER patient was found to be hypoxic and to be placed on BiPAP.  BNP was around 2000.  Chest x-ray shows congestion.  ABG shows hypercarbia with hypoxia.  On my exam patient has become encephalopathic probably from hypercarbia.  Patient has bilateral lower extremity edema significantly up to the knees.  Review of Systems: As per HPI, rest all negative.   Past Medical History:  Diagnosis Date  . Atrial flutter ([place])    a. recurrent AFlutter with RVR;  b. Amiodarone Rx started 4/16  . CAD (coronary artery disease)    a. LHC 1/16:  mLAD diffuse disease, pLCx mild disease, dLCx with disease but too small for PCI, RCA ok, EF 25-30%  . Chronic pain   . Chronic systolic CHF (congestive heart failure) ([place])   . COPD (chronic obstructive pulmonary disease) ([place])   . Diabetes mellitus without complication ([place])   . Hypercholesteremia   . Hypertension   . NICM (nonischemic cardiomyopathy) ([place])    a.dx 2016. b. 2D echo 06/2016 - Last echo 07/01/16: mod dilated LV, mod LVH, EF 25-30%, mild-mod MR, sev LAE, mild-mod reduced RV systolic function, mild-mod TR, PASP 3mHG.  .Marland KitchenPAF (paroxysmal atrial fibrillation) (HCC)    On  amio - ot a candidate for flecainide due to cardiomyopathy, not a candidate for Tikosyn due to prolonged QT, and felt to be a poor candidate for ablation given left atrial size.  . Prolonged Q-T interval on ECG   . Pulmonary hypertension ([facility]   . Tobacco abuse     Past Surgical History:  Procedure Laterality Date  . LEFT AND RIGHT HEART CATHETERIZATION WITH CORONARY ANGIOGRAM N/A 11/06/2014   Procedure: LEFT AND RIGHT HEART CATHETERIZATION WITH CORONARY ANGIOGRAM;  Surgeon: JJettie Booze MD;  [facility];  Service: Cardiovascular;  Laterality: N/A;  . SPLENECTOMY       reports that he has been smoking cigarettes. He has a 34.00 pack-year smoking history. He has never used smokeless tobacco. He reports that he does not drink alcohol or use drugs.  No Known Allergies  Family History  Problem Relation Age of Onset  . Heart disease Mother   . Hypertension Mother   . Heart failure Mother   . Heart disease Father   . Hypertension Sister   . Heart attack Neg Hx   . Stroke Neg Hx     Prior to Admission medications   Medication Sig Start Date End Date Taking? Authorizing Provider  albuterol (PROVENTIL HFA;VENTOLIN HFA) 108 (90 BASE) MCG/ACT inhaler Inhale 2 puffs into the lungs every 6 (six) hours as needed for wheezing or shortness of breath. 11/07/14  Yes Buriev, UArie Sabina MD  budesonide-formoterol (SYMBICORT) 160-4.5 MCG/ACT inhaler  Inhale 2 puffs into the lungs 2 (two) times daily.   Yes [provider]  carvedilol (COREG) 12.5 MG tablet Take 1 tablet (12.5 mg total) by mouth 2 (two) times daily with a meal. 11/05/16  Yes Regalado, Belkys A, MD  diazepam (VALIUM) 10 MG tablet Take 0.5 tablets (5 mg total) by mouth daily as needed for anxiety or sleep. Patient taking differently: Take 10 mg by mouth daily as needed for anxiety or sleep.  12/19/16  Yes Theodis Blaze, MD  ferrous sulfate 325 (65 FE) MG tablet Take 325 mg by mouth 3 (three) times daily as needed (for  supplementation).   Yes [provider]  fluticasone (FLONASE) 50 MCG/ACT nasal spray Place 2 sprays into both nostrils daily as needed for allergies.    Yes [provider]  folic acid (FOLVITE) 1 MG tablet Take 1 mg by mouth daily.   Yes [provider]  gabapentin (NEURONTIN) 600 MG tablet Take 1 tablet (600 mg total) by mouth 2 (two) times daily. 12/19/16  Yes Theodis Blaze, MD  glucose 4 GM chewable tablet Chew 4 tablets by mouth See admin instructions. Chew 4 tablets by mouth as needed for low blood sugar - repeat every 15 minutes if blood sugar less than 70   Yes [provider]  guaifenesin (HUMIBID E) 400 MG TABS tablet Take 400 mg by mouth 3 (three) times daily.   Yes [provider]  hydrocortisone (ANUSOL-HC) 2.5 % rectal cream Place 1 application rectally 2 (two) times daily as needed for hemorrhoids or itching. Use up to 2 weeks at a time as needed   Yes [provider]  hydrocortisone 1 % lotion Apply 1 application topically See admin instructions. Apply small amount to back twice daily for itchy rash   Yes [provider]  insulin aspart protamine - aspart (NOVOLOG MIX 70/30 FLEXPEN) (70-30) 100 UNIT/ML FlexPen Inject 30 Units into the skin 2 (two) times daily.    Yes [provider]  ipratropium (ATROVENT) 0.02 % nebulizer solution Take 2.5 mLs (0.5 mg total) by nebulization every 6 (six) hours as needed for wheezing or shortness of breath. 12/19/16  Yes Theodis Blaze, MD  ipratropium-albuterol (DUONEB) 0.5-2.5 (3) MG/3ML SOLN Take 3 mLs by nebulization 2 (two) times daily.   Yes [provider]  latanoprost (XALATAN) 0.005 % ophthalmic solution Place 1 drop into both eyes at bedtime.   Yes [provider]  lisinopril (PRINIVIL,ZESTRIL) 20 MG tablet Take 1 tablet (20 mg total) by mouth daily. 05/27/18  Yes Georgiana Shore, NP  Magnesium Oxide 420 (252 Mg) MG TABS Take 420 mg by mouth 2 (two) times  daily.   Yes [provider]  metFORMIN (GLUCOPHAGE-XR) 500 MG 24 hr tablet Take 1,000 mg by mouth 2 (two) times daily with a meal.   Yes [provider]  oxyCODONE-acetaminophen (PERCOCET) 10-325 MG tablet Take 1 tablet by mouth every 6 (six) hours as needed for pain.   Yes [provider]  potassium chloride SA (K-DUR,KLOR-CON) 20 MEQ tablet Take 2 tablets (40 mEq total) by mouth daily. 11/07/14  Yes Buriev, Arie Sabina, MD  rivaroxaban (XARELTO) 20 MG TABS tablet Take 1 tablet (20 mg total) by mouth daily with supper. Patient taking differently: Take 20 mg by mouth daily at 6 PM.  01/20/17  Yes Jettie Booze, MD  rosuvastatin (CRESTOR) 20 MG tablet Take 1 tablet (20 mg total) by mouth daily. 05/07/18  Yes  Matilde Haymaker, MD  spironolactone (ALDACTONE) 25 MG tablet Take 1 tablet (25 mg total) by mouth at bedtime. 02/04/18 02/04/19 Yes Tillery, Satira Mccallum, PA-C  torsemide (DEMADEX) 20 MG tablet Take 2 tablets (40 mg total) by mouth 2 (two) times daily. 08/06/17  Yes Shirley Friar, PA-C  OXYGEN Inhale 3 L into the lungs continuous.     [provider]    Physical Exam: Vitals:   07/25/18 2230 07/25/18 2300 07/25/18 2330 07/26/18 0017  BP: 118/68 122/69 119/63 131/70  Pulse: 79 85 74 75  Resp: 20 (!) 22 (!) 25 19  Temp:      TempSrc:      SpO2: 97% 98% 97% 96%  Weight:      Height:          Constitutional: Moderately built and nourished. Vitals:   07/25/18 2230 07/25/18 2300 07/25/18 2330 07/26/18 0017  BP: 118/68 122/69 119/63 131/70  Pulse: 79 85 74 75  Resp: 20 (!) 22 (!) 25 19  Temp:      TempSrc:      SpO2: 97% 98% 97% 96%  Weight:      Height:       Eyes: Anicteric no pallor. ENMT: No discharge from the ears eyes nose or mouth. Neck: No mass felt.  No neck rigidity.  JVD not appreciated. Respiratory: Bilateral coarse crepitations rhonchi not appreciated. Cardiovascular: S1-S2 heard no murmurs appreciated. Abdomen: Soft  nontender bowel sounds present. Musculoskeletal: Bilateral lower extremity edema present up to the thighs. Skin: No rash. Neurologic: Patient appears confused and somnolent at this time.  Does follow commands on repeated questioning.  Pupils are reacting to light. Psychiatric: Encephalopathic.   Labs on Admission: I have personally reviewed following labs and imaging studies  CBC: Recent Labs  Lab 07/25/18 2159  WBC 8.1  NEUTROABS 6.1  HGB 9.5*  HCT 34.5*  MCV 90.1  PLT 539   Basic Metabolic Panel: Recent Labs  Lab 07/25/18 2159  NA 132*  K 5.6*  CL 97*  CO2 28  GLUCOSE 161*  BUN 21  CREATININE 1.17  CALCIUM 8.5*   GFR: Estimated Creatinine Clearance: 80.2 mL/min (by C-G formula based on SCr of 1.17 mg/dL). Liver Function Tests: No results for input(s): AST, ALT, ALKPHOS, BILITOT, PROT, ALBUMIN in the last 168 hours. No results for input(s): LIPASE, AMYLASE in the last 168 hours. No results for input(s): AMMONIA in the last 168 hours. Coagulation Profile: No results for input(s): INR, PROTIME in the last 168 hours. Cardiac Enzymes: No results for input(s): CKTOTAL, CKMB, CKMBINDEX, TROPONINI in the last 168 hours. BNP (last 3 results) No results for input(s): PROBNP in the last 8760 hours. HbA1C: No results for input(s): HGBA1C in the last 72 hours. CBG: No results for input(s): GLUCAP in the last 168 hours. Lipid Profile: No results for input(s): CHOL, HDL, LDLCALC, TRIG, CHOLHDL, LDLDIRECT in the last 72 hours. Thyroid Function Tests: No results for input(s): TSH, T4TOTAL, FREET4, T3FREE, THYROIDAB in the last 72 hours. Anemia Panel: No results for input(s): VITAMINB12, FOLATE, FERRITIN, TIBC, IRON, RETICCTPCT in the last 72 hours. Urine analysis:    Component Value Date/Time   COLORURINE YELLOW 12/13/2016 0420   APPEARANCEUR CLEAR 12/13/2016 0420   LABSPEC 1.010 12/13/2016 0420   PHURINE 5.0 12/13/2016 0420   GLUCOSEU >=500 (A) 12/13/2016 0420   HGBUR  NEGATIVE 12/13/2016 0420   BILIRUBINUR NEGATIVE 12/13/2016 [place] 12/13/2016 0420   PROTEINUR 100 (A) 12/13/2016 0420  UROBILINOGEN 0.2 10/26/2014 2206   NITRITE NEGATIVE 12/13/2016 0420   LEUKOCYTESUR NEGATIVE 12/13/2016 0420   Sepsis Labs: _0 (procalcitonin:4,lacticidven:4) )No results found for this or any previous visit (from the past 240 hour(s)).   Radiological Exams on Admission: Dg Chest Port 1 View  Result Date: 07/25/2018 CLINICAL DATA:  Dyspnea EXAM: PORTABLE CHEST 1 VIEW COMPARISON:  05/02/2018 chest radiograph. FINDINGS: Stable cardiomediastinal silhouette with moderate cardiomegaly. No pneumothorax. Small bilateral pleural effusions, left greater than right. Mild pulmonary edema. Bibasilar hazy opacity. IMPRESSION: 1. Moderate cardiomegaly with mild pulmonary edema and small bilateral pleural effusions, left greater than right, compatible with mild congestive heart failure. 2. Hazy bibasilar lung opacities, favor atelectasis. Electronically Signed   By: Ilona Sorrel M.D.   On: 07/25/2018 22:06    EKG: Independently reviewed.  Normal sinus rhythm.  Assessment/Plan Principal Problem:   Acute respiratory failure with hypoxia and hypercapnia (HCC) Active Problems:   Type 2 diabetes mellitus with neuropathy   PAF (paroxysmal atrial fibrillation) (HCC)   Nonischemic cardiomyopathy (HCC)   CAD - Non-obstructive by VHO6/43   Chronic systolic CHF (congestive heart failure) (HCC)   COPD (chronic obstructive pulmonary disease) (HCC)   OSA (obstructive sleep apnea)   Pulmonary hypertension ([place])    1. Acute respiratory failure with hypoxia and hypercarbia likely secondary to CHF exacerbation and possible contribution from obesity hypoventilation and also possible COPD.  Patient has been placed on Lasix 40 mg IV every 8 may need further elevation of dose based on response.  Continue BiPAP.  Will get repeat ABG.  Will also place patient on nebulizer and IV  steroids for now.  I have consulted pulmonary critical care since patient is becoming encephalopathic. 2. Acute encephalopathy likely from hypercarbia.  But since patient is on blood thinners may need CT head if does not improve. 3. Paroxysmal atrial fibrillation presently in sinus rhythm.  This patient is encephalopathic and will be n.p.o. and on BiPAP I have placed patient on IV heparin for anticoagulation. 4. Chronic anemia follow CBC. 5. Diabetes mellitus type 2 -keep patient on sliding scale coverage for now and if blood sugar tends to go up will need to place patient on long-acting insulin.   DVT prophylaxis: Heparin infusion. Code Status: Full code. Family Communication: Patient's wife. Disposition Plan: Home. Consults called: Pulmonary critical care. Admission status: Inpatient.   Rise Patience MD [facility] Pager [phone].  If 7PM-7AM, please contact night-coverage [email or web address] Password [facility]  07/26/2018, 12:23 AM

## 2018-07-26 NOTE — ED Notes (Signed)
Patient transported to CT and will be transported to [place] thereafter with RN and Respiratory-Monique,RN

## 2018-07-26 NOTE — ED Notes (Signed)
Inserted foley catheter

## 2018-07-26 NOTE — Consult Note (Addendum)
Advanced Heart Failure Team Consult Note   Primary Physician: [facility] PCP-Cardiologist:  No primary care provider on file.  Reason for Consultation: A/C systolic CHF and COPD  HPI:    Nicholas Caldwell is seen today for evaluation of A/C systolic CHF and AECOPD at the request of Dr. Ermalene Postin Arizpe is a 72 y.o. male with PMH of morbid obesity, chronic systolic CHF/nonischemic cardiomyopathy Echo 12/13/2016 LVEF 40-45%, COPD on home oxygen, PAF and history of non compliance.   Previously admitted 6/57-06/06/68 with A/C systolic HF and COPD exacerbation. Diuresed with IV lasix. Treated with steroids for COPD exacerbation. Troponin was elevated, so AHF consulted. Not thought to be ACS. Torsemide held for AKI, but resumed torsemide 40 mg BID prior to discharge. DC weight: 272 lbs.   Last seen in HF clinic 05/27/18 for post-hospital visit.  At that visit, weight stable still had HHPT. CPAP was pending delivery. He was taking torsemide 60 mg BID instead of 40 mg as ordered. BP was elevated. Lisinopril increased.   Pt presented to [facility] 07/25/18 with increasing SOB and worsening peripheral edema. Found to be hypoxic and placed on BiPAP. CXR with congestion. ABG with hypercarbic and hypoxic resp failure. Noted to be encephalopathic thought to be 2/2 to hypercarbia. Also noted to have significant BLE edema. Pertinent labs on admission include K 5.5, Cr 1.06, BNP 2064, Negative troponin, Hgb 9.5, and WBC 8.1.  Currently placed back on BiPAP. Alert to person per nurse. He stirs to questioning and stimuli, but falls right back to sleep. Most recent ABG with pH 7.363, CO2 59.8, Bicarb 34. Wife at bedside. She states he never did get a sleep study, and therefore never started CPAP. No recent fever or chills. She states he didn't take his lasix for 3-4 days last week,and has intermittent compliance. Not weighing regularly at home. Continues to smoke. Per wife, he has been drinking lots of  fluid and he has been more orthopneic and SOB with any exertion. She feels like his weight is up about 20 pounds. Pt was confused prior to arrival to the hospital, but seems to be getting closer to his baseline.   Echo 04/07/18 LVEF 40-45%, mild LAE, mild RV dilation, PA peak 62 mm Hg.  ROS: Patient is encephalopathic. History has been obtained from chart review.   Home Medications Prior to Admission medications   Medication Sig Start Date End Date Taking? Authorizing Provider  albuterol (PROVENTIL HFA;VENTOLIN HFA) 108 (90 BASE) MCG/ACT inhaler Inhale 2 puffs into the lungs every 6 (six) hours as needed for wheezing or shortness of breath. 11/07/14  Yes Buriev, Arie Sabina, MD  budesonide-formoterol (SYMBICORT) 160-4.5 MCG/ACT inhaler Inhale 2 puffs into the lungs 2 (two) times daily.   Yes [provider]  carvedilol (COREG) 12.5 MG tablet Take 1 tablet (12.5 mg total) by mouth 2 (two) times daily with a meal. 11/05/16  Yes Regalado, Belkys A, MD  diazepam (VALIUM) 10 MG tablet Take 0.5 tablets (5 mg total) by mouth daily as needed for anxiety or sleep. Patient taking differently: Take 10 mg by mouth daily as needed for anxiety or sleep.  12/19/16  Yes Theodis Blaze, MD  ferrous sulfate 325 (65 FE) MG tablet Take 325 mg by mouth 3 (three) times daily as needed (for supplementation).   Yes [provider]  fluticasone (FLONASE) 50 MCG/ACT nasal spray Place 2 sprays into both nostrils daily as needed for allergies.    Yes [provider]  folic acid (FOLVITE) 1 MG tablet Take 1 mg by mouth daily.   Yes [provider]  gabapentin (NEURONTIN) 600 MG tablet Take 1 tablet (600 mg total) by mouth 2 (two) times daily. 12/19/16  Yes Theodis Blaze, MD  glucose 4 GM chewable tablet Chew 4 tablets by mouth See admin instructions. Chew 4 tablets by mouth as needed for low blood sugar - repeat every 15 minutes if blood sugar less than 70   Yes [provider]  guaifenesin  (HUMIBID E) 400 MG TABS tablet Take 400 mg by mouth 3 (three) times daily.   Yes [provider]  hydrocortisone (ANUSOL-HC) 2.5 % rectal cream Place 1 application rectally 2 (two) times daily as needed for hemorrhoids or itching. Use up to 2 weeks at a time as needed   Yes [provider]  hydrocortisone 1 % lotion Apply 1 application topically See admin instructions. Apply small amount to back twice daily for itchy rash   Yes [provider]  insulin aspart protamine - aspart (NOVOLOG MIX 70/30 FLEXPEN) (70-30) 100 UNIT/ML FlexPen Inject 30 Units into the skin 2 (two) times daily.    Yes [provider]  ipratropium (ATROVENT) 0.02 % nebulizer solution Take 2.5 mLs (0.5 mg total) by nebulization every 6 (six) hours as needed for wheezing or shortness of breath. 12/19/16  Yes Theodis Blaze, MD  ipratropium-albuterol (DUONEB) 0.5-2.5 (3) MG/3ML SOLN Take 3 mLs by nebulization 2 (two) times daily.   Yes [provider]  latanoprost (XALATAN) 0.005 % ophthalmic solution Place 1 drop into both eyes at bedtime.   Yes [provider]  lisinopril (PRINIVIL,ZESTRIL) 20 MG tablet Take 1 tablet (20 mg total) by mouth daily. 05/27/18  Yes Georgiana Shore, NP  Magnesium Oxide 420 (252 Mg) MG TABS Take 420 mg by mouth 2 (two) times daily.   Yes [provider]  metFORMIN (GLUCOPHAGE-XR) 500 MG 24 hr tablet Take 1,000 mg by mouth 2 (two) times daily with a meal.   Yes [provider]  oxyCODONE-acetaminophen (PERCOCET) 10-325 MG tablet Take 1 tablet by mouth every 6 (six) hours as needed for pain.   Yes [provider]  potassium chloride SA (K-DUR,KLOR-CON) 20 MEQ tablet Take 2 tablets (40 mEq total) by mouth daily. 11/07/14  Yes Buriev, Arie Sabina, MD  rivaroxaban (XARELTO) 20 MG TABS tablet Take 1 tablet (20 mg total) by mouth daily with supper. Patient taking differently: Take 20 mg by mouth daily at 6 PM.  01/20/17  Yes Jettie Booze, MD  rosuvastatin (CRESTOR) 20 MG tablet Take 1 tablet (20 mg total) by mouth daily. 05/07/18  Yes Matilde Haymaker, MD  spironolactone (ALDACTONE) 25 MG tablet Take 1 tablet (25 mg total) by mouth at bedtime. 02/04/18 02/04/19 Yes Tillery, Satira Mccallum, PA-C  torsemide (DEMADEX) 20 MG tablet Take 2 tablets (40 mg total) by mouth 2 (two) times daily. 08/06/17  Yes Shirley Friar, PA-C  OXYGEN Inhale 3 L into the lungs continuous.     [provider]    Past Medical History: Past Medical History:  Diagnosis Date  . Atrial flutter ([place])    a. recurrent AFlutter with RVR;  b. Amiodarone Rx started 4/16  . CAD (coronary artery disease)    a. LHC 1/16:  mLAD diffuse disease, pLCx mild disease, dLCx with disease but too small for PCI, RCA ok, EF 25-30%  . Chronic pain   . Chronic systolic  CHF (congestive heart failure) ([place])   . COPD (chronic obstructive pulmonary disease) ([place])   . Diabetes mellitus without complication ([place])   . Hypercholesteremia   . Hypertension   . NICM (nonischemic cardiomyopathy) ([place])    a.dx 2016. b. 2D echo 06/2016 - Last echo 07/01/16: mod dilated LV, mod LVH, EF 25-30%, mild-mod MR, sev LAE, mild-mod reduced RV systolic function, mild-mod TR, PASP 74mHG.  .Marland KitchenPAF (paroxysmal atrial fibrillation) (HCC)    On amio - ot a candidate for flecainide due to cardiomyopathy, not a candidate for Tikosyn due to prolonged QT, and felt to be a poor candidate for ablation given left atrial size.  . Pulmonary hypertension ([place]   . Tobacco abuse     Past Surgical History: Past Surgical History:  Procedure Laterality Date  . LEFT AND RIGHT HEART CATHETERIZATION WITH CORONARY ANGIOGRAM N/A 11/06/2014   Procedure: LEFT AND RIGHT HEART CATHETERIZATION WITH CORONARY ANGIOGRAM;  Surgeon: JJettie Booze MD;  [facility];  Service: Cardiovascular;  Laterality: N/A;  . SPLENECTOMY      Family History: Family History  Problem Relation Age of Onset    . Heart disease Mother   . Hypertension Mother   . Heart failure Mother   . Heart disease Father   . Hypertension Sister   . Heart attack Neg Hx   . Stroke Neg Hx     Social History: Social History   Socioeconomic History  . Marital status: Married    Spouse name: Not on file  . Number of children: Not on file  . Years of education: Not on file  . Highest education level: Not on file  Occupational History  . Not on file  Social Needs  . Financial resource strain: Not on file  . Food insecurity:    Worry: Not on file    Inability: Not on file  . Transportation needs:    Medical: Not on file    Non-medical: Not on file  Tobacco Use  . Smoking status: Current Every Day Smoker    Packs/day: 1.00    Years: 34.00    Pack years: 34.00    Types: Cigarettes  . Smokeless tobacco: Never Used  Substance and Sexual Activity  . Alcohol use: No    Alcohol/week: 0.0 standard drinks  . Drug use: No  . Sexual activity: Not on file  Lifestyle  . Physical activity:    Days per week: Not on file    Minutes per session: Not on file  . Stress: Not on file  Relationships  . Social connections:    Talks on phone: Not on file    Gets together: Not on file    Attends religious service: Not on file    Active member of club or organization: Not on file    Attends meetings of clubs or organizations: Not on file    Relationship status: Not on file  Other Topics Concern  . Not on file  Social History Narrative  . Not on file    Allergies:  No Known Allergies  Objective:    Vital Signs:   Temp:  [98.2 F (36.8 C)-98.8 F (37.1 C)] 98.8 F (37.1 C) (09/23 1211) Pulse Rate:  [63-85] 63 (09/23 1300) Resp:  [19-36] 21 (09/23 1300) BP: (106-131)/(57-81) 124/69 (09/23 1300) SpO2:  [94 %-100 %] 100 % (09/23 1300) FiO2 (%):  [40 %] 40 % (09/23 1200) Weight:  [117.9 kg] 117.9 kg (09/23 0415) Last BM Date: 07/26/18  Weight change: Filed Weights   07/25/18 2151 07/26/18 0415   Weight: 117.9 kg 117.9 kg    Intake/Output:   Intake/Output Summary (Last 24 hours) at 07/26/2018 1359 Last data filed at 07/26/2018 1300 Gross per 24 hour  Intake 221.81 ml  Output 1450 ml  Net -1228.19 ml      Physical Exam    General:  Ill appearing. Lethargic but responsive on Bipap.  HEENT: BiPAP in place.  Neck: supple. JVP to jaw. Carotids 2+ bilat; no bruits. No lymphadenopathy or thyromegaly appreciated. Cor: PMI nondisplaced. Regular, brady. No rubs, gallops or murmurs. ? Large bony mass vs very large xyphoid process.  Lungs: Diminished throughout. No wheeze.  Abdomen: Obese, soft, nondistended. No hepatosplenomegaly. No bruits or masses. + BS. Extremities: no cyanosis, clubbing, or rash. 2-3+ edema into thighs.  Neuro: Encephalopathic. On BiPAP. Stirs to questioning or physical stimuli, but quickly falls back asleep.   Telemetry   Sinus brady 60s, personally reviewed.   EKG    NSR 83 bpm on admission, personally reviewed.   Labs   Basic Metabolic Panel: Recent Labs  Lab 07/25/18 2159 07/26/18 0059 07/26/18 0440 07/26/18 0631  NA 132* 136 136 137  K 5.6* 5.8* 5.5* 5.7*  CL 97* 95* 95* 96*  CO2 _0 GLUCOSE 161* 119* 148* 147*  BUN _1 CREATININE 1.17 1.15 1.06 1.08  CALCIUM 8.5* 8.5* 8.7* 8.8*  MG  --  2.3 2.1 2.1  PHOS  --   --   --  4.7*    Liver Function Tests: Recent Labs  Lab 07/26/18 0631  AST 15  ALT 9  ALKPHOS 95  BILITOT 0.5  PROT 7.1  ALBUMIN 3.0*   No results for input(s): LIPASE, AMYLASE in the last 168 hours. Recent Labs  Lab 07/26/18 0440  AMMONIA 42*    CBC: Recent Labs  Lab 07/25/18 2159 07/26/18 0631  WBC 8.1 5.7  NEUTROABS 6.1  --   HGB 9.5* 9.3*  HCT 34.5* 32.7*  MCV 90.1 87.9  PLT 371 366    Cardiac Enzymes: Recent Labs  Lab 07/26/18 0059 07/26/18 0631 07/26/18 1157  TROPONINI 0.04* 0.04* <0.03    BNP: BNP (last 3 results) Recent Labs    03/15/18 2356 04/30/18 2049  07/25/18 2159  BNP 866.2* 1,912.5* 2,064.1*    ProBNP (last 3 results) No results for input(s): PROBNP in the last 8760 hours.   CBG: Recent Labs  Lab 07/26/18 0453 07/26/18 0749 07/26/18 1209  GLUCAP 137* 147* 144*    Coagulation Studies: Recent Labs    07/26/18 0440  LABPROT 18.5*  INR 1.56     Imaging   Dg Chest Port 1 View  Result Date: 07/25/2018 CLINICAL DATA:  Dyspnea EXAM: PORTABLE CHEST 1 VIEW COMPARISON:  05/02/2018 chest radiograph. FINDINGS: Stable cardiomediastinal silhouette with moderate cardiomegaly. No pneumothorax. Small bilateral pleural effusions, left greater than right. Mild pulmonary edema. Bibasilar hazy opacity. IMPRESSION: 1. Moderate cardiomegaly with mild pulmonary edema and small bilateral pleural effusions, left greater than right, compatible with mild congestive heart failure. 2. Hazy bibasilar lung opacities, favor atelectasis. Electronically Signed   By: Ilona Sorrel M.D.   On: 07/25/2018 22:06      Medications:     Current Medications: . arformoterol  15 mcg Nebulization BID  . budesonide (PULMICORT) nebulizer solution  0.5 mg Nebulization BID  . chlorhexidine  15 mL Mouth Rinse BID  . furosemide  40 mg Intravenous Q12H  .  insulin aspart  2-6 Units Subcutaneous Q4H  . latanoprost  1 drop Both Eyes QHS  . mouth rinse  15 mL Mouth Rinse q12n4p     Infusions: . sodium chloride 250 mL (07/26/18 1146)  . acetaminophen    . famotidine (PEPCID) IV Stopped (07/26/18 1218)  . heparin 1,700 Units/hr (07/26/18 1108)     Patient Profile   Nicholas Caldwell is a 72 y.o. male e with PMH of chronic systolic CHF/nonischemic cardiomyopathy Echo 12/13/2016 LVEF 40-45%, COPD on home oxygen, PAF and history of non compliance.   Admitted overnight into 07/26/18 with acute hypoxic hypercarbic resp failure and A/C CHF.   Assessment/Plan   1. Acute on chronic hypoxic hypercarbic resp failure - CO2 high. Remains on BiPAP for now.  - Per wife he  was scheduled for sleep study and never went. He has had suspected sleep apnea for years, and has always had positive response to CPAP while in hospital  2. Acute on chronic systolic CHF  - Echo 9/98/33 40-45%, Grade 2 DD, Mod LAE, Mild RV, Mod RAE, PA peak pressure 56 mm Hg.  - Echo 04/07/18: EF 40-45%, PA peak pressure 62 mmHg - Volume status markedly elevated on exam.  - Increase IV lasix to 80 mg BID.  - No spiro or ACE/ARB with hyperK.  - Will not resume BB at this time with decompensation.  - Consider repeat Echo, though in setting of non-compliance may not be necessary.  3. Paroxysmal Afib - Appears to be in NSR/sinus brady. - On Xarelto chronically. Covering with heparin for now.  - CHA2DS2/VASc is 5.  - Has previously failed amioadrone and poor tikosyn candidate with long QT. Now following with EP for possible long QT syndrome.  - Poor ablation candidate with LA size 4. COPD - On BiPAP today. Previous Chest CT with Emphysema. - Dr. Melvyn Novas follows as outpatient.  - No active wheeze on exam - On chronic O2.  5. Tobacco abuse  - Needs complete cessation.  6. Hip arthritis - Pt is hoping to schedule hip arthroplasty once cleared by [place] and his PCP.  - Echo had been repeated, with results as above.May need to repeat with decompensation.  -Surgical risk is moderate with 6.6% chance of MI, pulmonary edema, VF, cardiac arrest, or complete heart block according to [facility] Criteria. 7. Mild nonobstructive CAD on LHC 2016 - Continue crestor 20 mg daily  - Xarelto chronic. On heparin currently. No ASA.  8. Hyperkalemia - K 5.7 this am. Recheck, as he has had excellent diuresis on lasix thus far.  - If remains elevated, will consider reversal, but options limited currently with NPO status.   Medication concerns reviewed with patient and pharmacy team. Barriers identified: Non-compliance   Length of Stay: 0  Shirley Friar, PA-C  07/26/2018, 1:59 PM  Advanced Heart Failure  Team Pager [phone] (M-F; 7a - 4p)  Please contact [facility] for night-coverage after hours (4p -7a ) and weekends on [email or web address]  Patient seen and examined with the above-signed Advanced Practice Provider and/or Housestaff. I personally reviewed laboratory data, imaging studies and relevant notes. I independently examined the patient and formulated the important aspects of the plan. I have edited the note to reflect any of my changes or salient points. I have personally discussed the plan with the patient and/or family.  72 y/o male with systolic HF due to NICM, morbid obesity, OSA/OHS now admitted with acute on chronic hypercarbic/hypoxemic respiratory failure in the setting of recurrent  volume overload and noncompliance with his medicines. Agree with Bipap for now. He is starting to respond well to IV lasix and will continue. Will need close monitoring of electrolytes and renal function. Currently in NSR. Continue AC.   Glori Bickers, MD  9:47 PM

## 2018-07-26 NOTE — Progress Notes (Signed)
Dr. Elsworth Soho made aware of troponin results.

## 2018-07-26 NOTE — ED Notes (Signed)
Heparin verified with [facility]

## 2018-07-26 NOTE — Progress Notes (Signed)
Welcome Progress Note Patient Name: Starlin Steib DOB: [birth year] MRN: [id number]   Date of Service  07/26/2018  HPI/Events of Note  Patient c/o R hip pain - Currently NPO. Creatinine = 0.97.   eICU Interventions  Will order: 1. Toradol 15 mg IV now.      Intervention Category Intermediate Interventions: Pain - evaluation and management  Dyron Kawano Cornelia Copa 07/26/2018, 9:49 PM

## 2018-07-26 NOTE — Progress Notes (Signed)
Pt admitted from E.D. via hospital stretcher w/ BIPAP, monitor and RN; Pt assisted to hospital bed and attached to unit monitors and O2; Pt introduced to staff and unit, MRSA swab done and 6 cloth CHG bath complete. Questions answered and support give, Pt's family to be brought to bedside for update. Provided visitation guidelines.

## 2018-07-26 NOTE — Progress Notes (Signed)
ANTICOAGULATION CONSULT NOTE - Follow Up Consult  Pharmacy Consult for heparin Indication: atrial fibrillation  No Known Allergies  Patient Measurements: Height: _0  (188 cm) Weight: 259 lb 14.8 oz (117.9 kg) IBW/kg (Calculated) : 82.2 Heparin Dosing Weight: 110kg  Vital Signs: Temp: 99.3 F (37.4 C) (09/23 1605) Temp Source: Oral (09/23 1605) BP: 124/61 (09/23 1700) Pulse Rate: 68 (09/23 1752)  Labs: Recent Labs    07/25/18 2159 07/26/18 0059 07/26/18 0440 07/26/18 0631 07/26/18 1157 07/26/18 1543  HGB 9.5*  --   --  9.3*  --   --   HCT 34.5*  --   --  32.7*  --   --   PLT 371  --   --  366  --   --   APTT  --   --  45* 48*  --  109*  LABPROT  --   --  18.5*  --   --   --   INR  --   --  1.56  --   --   --   HEPARINUNFRC  --   --   --  1.18*  --   --   CREATININE 1.17 1.15 1.06 1.08  --   --   TROPONINI  --  0.04*  --  0.04* <0.03  --     Estimated Creatinine Clearance: 86.9 mL/min (by C-G formula based on SCr of 1.08 mg/dL).   Medical History: Past Medical History:  Diagnosis Date  . Atrial flutter ([place])    a. recurrent AFlutter with RVR;  b. Amiodarone Rx started 4/16  . CAD (coronary artery disease)    a. LHC 1/16:  mLAD diffuse disease, pLCx mild disease, dLCx with disease but too small for PCI, RCA ok, EF 25-30%  . Chronic pain   . Chronic systolic CHF (congestive heart failure) ([place])   . COPD (chronic obstructive pulmonary disease) ([place])   . Diabetes mellitus without complication ([place])   . Hypercholesteremia   . Hypertension   . NICM (nonischemic cardiomyopathy) ([place])    a.dx 2016. b. 2D echo 06/2016 - Last echo 07/01/16: mod dilated LV, mod LVH, EF 25-30%, mild-mod MR, sev LAE, mild-mod reduced RV systolic function, mild-mod TR, PASP 69mHG.  .Marland KitchenPAF (paroxysmal atrial fibrillation) (HCC)    On amio - ot a candidate for flecainide due to cardiomyopathy, not a candidate for Tikosyn due to prolonged QT, and felt to be a poor candidate for ablation given  left atrial size.  . Pulmonary hypertension ([place]   . Tobacco abuse     Assessment: 89yomale c/o SOB, admitted for acute respiratory failure w/ hypoxia and hypercapnia, to transition from Xarelto PTA to heparin for Hx PAF/Aflutter; last dose of Xarelto taken 9/21 at 1800.  Heparin drip 1700 uts/hr started last pm. Rate adjusted this am, aptt now slightly above goal at 109s. Will decrease rate to 1600 units/hr and recheck aptt in 8 hours   Goal of Therapy:  Heparin level 0.3-0.7 units/ml aPTT 66-102 seconds Monitor platelets by anticoagulation protocol: Yes   Plan:  Decrease heparin gtt to 1600 units/hr  Check aptt in 8hr monitor heparin levels, aPTT (while Xarelto affects anti-Xa), and CBC.  FErin HearingPharmD., BCPS Clinical Pharmacist 07/26/2018 6:01 PM

## 2018-07-26 NOTE — Progress Notes (Signed)
Pt noted to be difficult to arouse. Will awake up to pain and follow commands. Dr. Elsworth Soho made aware and to bedside to assess. Will continue to monitor.

## 2018-07-26 NOTE — Progress Notes (Signed)
ANTICOAGULATION CONSULT NOTE - Initial Consult  Pharmacy Consult for heparin Indication: atrial fibrillation  No Known Allergies  Patient Measurements: Height: _0  (188 cm) Weight: 260 lb (117.9 kg) IBW/kg (Calculated) : 82.2 Heparin Dosing Weight: 110kg  Vital Signs: Temp: 98.2 F (36.8 C) (09/22 2153) Temp Source: Axillary (09/22 2153) BP: 131/70 (09/23 0017) Pulse Rate: 75 (09/23 0017)  Labs: Recent Labs    07/25/18 2159  HGB 9.5*  HCT 34.5*  PLT 371  CREATININE 1.17    Estimated Creatinine Clearance: 80.2 mL/min (by C-G formula based on SCr of 1.17 mg/dL).   Medical History: Past Medical History:  Diagnosis Date  . Atrial flutter ([place])    a. recurrent AFlutter with RVR;  b. Amiodarone Rx started 4/16  . CAD (coronary artery disease)    a. LHC 1/16:  mLAD diffuse disease, pLCx mild disease, dLCx with disease but too small for PCI, RCA ok, EF 25-30%  . Chronic pain   . Chronic systolic CHF (congestive heart failure) ([place])   . COPD (chronic obstructive pulmonary disease) ([place])   . Diabetes mellitus without complication ([place])   . Hypercholesteremia   . Hypertension   . NICM (nonischemic cardiomyopathy) ([place])    a.dx 2016. b. 2D echo 06/2016 - Last echo 07/01/16: mod dilated LV, mod LVH, EF 25-30%, mild-mod MR, sev LAE, mild-mod reduced RV systolic function, mild-mod TR, PASP 33mHG.  .Marland KitchenPAF (paroxysmal atrial fibrillation) (HCC)    On amio - ot a candidate for flecainide due to cardiomyopathy, not a candidate for Tikosyn due to prolonged QT, and felt to be a poor candidate for ablation given left atrial size.  . Prolonged Q-T interval on ECG   . Pulmonary hypertension ([place]   . Tobacco abuse     Assessment: 89yomale c/o SOB, admitted for acute respiratory failure w/ hypoxia and hypercapnia, to transition from Xarelto PTA to heparin for PAF/Aflutter; last dose of Xarelto taken 9/21 at 1800.  Goal of Therapy:  Heparin level 0.3-0.7 units/ml aPTT 66-102  seconds Monitor platelets by anticoagulation protocol: Yes   Plan:  Will begin heparin gtt at 1500 units/hr and monitor heparin levels, aPTT (while Xarelto affects anti-Xa), and CBC.  VWynona Neat PharmD, BCPS  07/26/2018,12:32 AM

## 2018-07-26 NOTE — Plan of Care (Signed)
  Problem: Education: Goal: Knowledge of General Education information will improve Description Including pain rating scale, medication(s)/side effects and non-pharmacologic comfort measures Outcome: Progressing   Problem: Health Behavior/Discharge Planning: Goal: Ability to manage health-related needs will improve Outcome: Progressing   Problem: Clinical Measurements: Goal: Respiratory complications will improve Outcome: Progressing Goal: Cardiovascular complication will be avoided Outcome: Progressing   Problem: Coping: Goal: Level of anxiety will decrease Outcome: Progressing   Problem: Pain Managment: Goal: General experience of comfort will improve Outcome: Progressing  Educated pt multiple times on importance of avoiding opioid pain meds, risk on respiratory drive, and explained that his C02 has been high all day; pt on BIPAP at night. Given Toradol IV per MD order Problem: Education: Goal: Knowledge of disease or condition will improve Outcome: Progressing  Educated pt multiple times on importance of improving respiratory status with BIPAP and pulling off fluid with Lasix

## 2018-07-26 NOTE — H&P (Signed)
PULMONARY / CRITICAL CARE MEDICINE   NAME:  Nicholas Caldwell, MRN:  [id number], DOB:  16-Jan-1946, LOS: 0 ADMISSION DATE:  07/25/2018, CONSULTATION DATE: 07/26/18 REFERRING MD: ER MD, CHIEF COMPLAINT: SOB  BRIEF HISTORY:    CHF exacerbation HISTORY OF PRESENT ILLNESS   37yoM with hx COPD, CHF (EF 25%), Afib (on xarelto), Tobacco absue, Chronic hypoxia (on 3L O2), HTN, DM, Chronic pain, and Noncompliance, presents to the ER c/o SOB and Increasing BLE edema x 4 days, found to be hypoxic on 84% in ER. Pox 98% on BIPAP. In the ER patient found to be hypoxic and CXR showed pulmonary edema. He was started on BIPAP 16/6 with ABG after on BIPAP showing acute-on-chronic hypercapnea (7.27/73/83/34). IPAP increased from 16 to 18, EPAP kept at 6. No lasix or nebs given yet. PCCM then consulted. When I arrived in ER patient is being prepared to leave room. RN says patient going to Head CT for AMS then admitted to [facility]. I halted the transfer, examined patient, then discussed with hospitalist. It seems earlier tonight he was more somnolent and only aroused to sternal rub. At time of my exam however, he arouses to voice and obeys commands, afocal neuro exam. Will hold off on Head CT for now. Will admit patient to ICU since he is on BIPAP and high risk of decompensating and requiring intubation. Discussed plan of care with patient's wife at bedside.   SIGNIFICANT PAST MEDICAL HISTORY   See above   SIGNIFICANT EVENTS:  9/22: presented to ER with SOB and LE edema, diagnosed as CHF exacerbation >> placed on BIPAP  9/23: PCCM consulted >> Admit to ICU  STUDIES:   CXR (9/22): pulmonary edema  CULTURES:  Sputum culture (9/23): ordered   ANTIBIOTICS:  None   LINES/TUBES:  PIV's Foley catheter 9/23>>  CONSULTANTS:  None   SUBJECTIVE:  Lethargic on BIPAP  CONSTITUTIONAL: BP 107/62   Pulse 73   Temp 98.2 F (36.8 C) (Axillary)   Resp (!) 22   Ht _0  (1.88 m)   Wt 117.9 kg   SpO2 97%   BMI 33.38 kg/m   No intake/output data recorded.   FiO2 (%):  [40 %] 40 % PHYSICAL EXAM: General: WDWN Adult male, obese, lethargic, on BIPAP Neuro: Lethargic, opens eyes to loud voice or to touch, PERRL, Obeying commands. Moving all extremities. Equal motor and sensation bilaterally, no facial droop, tongue midline. Oriented to place only "hospital" but not to person or time.  HEENT: BIPAP full face mask in place, MM moist  Cardiovascular: Irreg Irreg, no m/r/g Lungs: CTA b/l Abdomen: Obese, soft, NTND, BS hypoactive, large mid-line old well-healed scar from prior splenctomy Musculoskeletal: 2+ BLE edema Skin: no rashes   RESOLVED PROBLEM LIST   ASSESSMENT AND PLAN   1. Acute-on-Chronic Hypercapnic and Hypoxic Respiratory failure; Pulmonary edema; CHF exacerbation: - increased BIPAP from 18/6 to 20/10 since he is obese with thick neck, likely has OSA, and has Vt of only 440 on the 18/6.  - Repeat ABG in 30 minutes - Give Lasix 60m IV now (was ordered at 12:23am but not given) - Place foley catheter  2. COPD: - no wheezing on my exam; likely not in exacerbation - is on an ICS/LABA at home; will start pulmicort and brovana nebs BID, duonebs q6hrs  3. Afib - on xarelto at home; since is NPO now, will start heparin gtt  4. DM - NPO; SSI PRN  5. AKI and Hyperkalemia: - creatinine 1.15 up from  baseline 0.96; K 5.8. Likely AKI related to cardiorenal syndrome and as such should improve with diuresis.   SUMMARY OF TODAY'S PLAN:  See above   Best Practice / Goals of Care / Disposition.   DVT PROPHYLAXIS: heparin gtt SUP: famotidine  NUTRITION: NPO MOBILITY: bedrest  GOALS OF CARE: FULL CODE FAMILY DISCUSSIONS: updated patient's wife at bedside  DISPOSITION admit to ICU  LABS  Glucose No results for input(s): GLUCAP in the last 168 hours.  BMET Recent Labs  Lab 07/25/18 2159 07/26/18 0059  NA 132* 136  K 5.6* 5.8*  CL 97* 95*  CO2 28 30  BUN 21 21  CREATININE 1.17 1.15  GLUCOSE  161* 119*   Liver Enzymes No results for input(s): AST, ALT, ALKPHOS, BILITOT, ALBUMIN in the last 168 hours.  Electrolytes Recent Labs  Lab 07/25/18 2159 07/26/18 0059  CALCIUM 8.5* 8.5*  MG  --  2.3   CBC Recent Labs  Lab 07/25/18 2159  WBC 8.1  HGB 9.5*  HCT 34.5*  PLT 371   ABG Recent Labs  Lab 07/26/18 0010  PHART 7.276*  PCO2ART 73.1*  PO2ART 83.0   Coag's No results for input(s): APTT, INR in the last 168 hours.  Sepsis Markers No results for input(s): LATICACIDVEN, PROCALCITON, O2SATVEN in the last 168 hours.  Cardiac Enzymes Recent Labs  Lab 07/26/18 0059  TROPONINI 0.04*   PAST MEDICAL HISTORY :   He  has a past medical history of Atrial flutter ([place]), CAD (coronary artery disease), Chronic pain, Chronic systolic CHF (congestive heart failure) ([place]), COPD (chronic obstructive pulmonary disease) ([place]), Diabetes mellitus without complication ([place]), Hypercholesteremia, Hypertension, NICM (nonischemic cardiomyopathy) ([place]), PAF (paroxysmal atrial fibrillation) ([place]), Pulmonary hypertension ([place]), and Tobacco abuse.  PAST SURGICAL HISTORY:  He  has a past surgical history that includes left and right heart catheterization with coronary angiogram (N/A, 11/06/2014) and Splenectomy.  No Known Allergies  No current facility-administered medications on file prior to encounter.    Current Outpatient Medications on File Prior to Encounter  Medication Sig  . albuterol (PROVENTIL HFA;VENTOLIN HFA) 108 (90 BASE) MCG/ACT inhaler Inhale 2 puffs into the lungs every 6 (six) hours as needed for wheezing or shortness of breath.  . budesonide-formoterol (SYMBICORT) 160-4.5 MCG/ACT inhaler Inhale 2 puffs into the lungs 2 (two) times daily.  . carvedilol (COREG) 12.5 MG tablet Take 1 tablet (12.5 mg total) by mouth 2 (two) times daily with a meal.  . diazepam (VALIUM) 10 MG tablet Take 0.5 tablets (5 mg total) by mouth daily as needed for anxiety or sleep. (Patient taking  differently: Take 10 mg by mouth daily as needed for anxiety or sleep. )  . ferrous sulfate 325 (65 FE) MG tablet Take 325 mg by mouth 3 (three) times daily as needed (for supplementation).  . fluticasone (FLONASE) 50 MCG/ACT nasal spray Place 2 sprays into both nostrils daily as needed for allergies.   . folic acid (FOLVITE) 1 MG tablet Take 1 mg by mouth daily.  Marland Kitchen gabapentin (NEURONTIN) 600 MG tablet Take 1 tablet (600 mg total) by mouth 2 (two) times daily.  Marland Kitchen glucose 4 GM chewable tablet Chew 4 tablets by mouth See admin instructions. Chew 4 tablets by mouth as needed for low blood sugar - repeat every 15 minutes if blood sugar less than 70  . guaifenesin (HUMIBID E) 400 MG TABS tablet Take 400 mg by mouth 3 (three) times daily.  . hydrocortisone (ANUSOL-HC) 2.5 % rectal cream Place 1 application  rectally 2 (two) times daily as needed for hemorrhoids or itching. Use up to 2 weeks at a time as needed  . hydrocortisone 1 % lotion Apply 1 application topically See admin instructions. Apply small amount to back twice daily for itchy rash  . insulin aspart protamine - aspart (NOVOLOG MIX 70/30 FLEXPEN) (70-30) 100 UNIT/ML FlexPen Inject 30 Units into the skin 2 (two) times daily.   Marland Kitchen ipratropium (ATROVENT) 0.02 % nebulizer solution Take 2.5 mLs (0.5 mg total) by nebulization every 6 (six) hours as needed for wheezing or shortness of breath.  Marland Kitchen ipratropium-albuterol (DUONEB) 0.5-2.5 (3) MG/3ML SOLN Take 3 mLs by nebulization 2 (two) times daily.  Marland Kitchen latanoprost (XALATAN) 0.005 % ophthalmic solution Place 1 drop into both eyes at bedtime.  Marland Kitchen lisinopril (PRINIVIL,ZESTRIL) 20 MG tablet Take 1 tablet (20 mg total) by mouth daily.  . Magnesium Oxide 420 (252 Mg) MG TABS Take 420 mg by mouth 2 (two) times daily.  . metFORMIN (GLUCOPHAGE-XR) 500 MG 24 hr tablet Take 1,000 mg by mouth 2 (two) times daily with a meal.  . oxyCODONE-acetaminophen (PERCOCET) 10-325 MG tablet Take 1 tablet by mouth every 6 (six)  hours as needed for pain.  . potassium chloride SA (K-DUR,KLOR-CON) 20 MEQ tablet Take 2 tablets (40 mEq total) by mouth daily.  . rivaroxaban (XARELTO) 20 MG TABS tablet Take 1 tablet (20 mg total) by mouth daily with supper. (Patient taking differently: Take 20 mg by mouth daily at 6 PM. )  . rosuvastatin (CRESTOR) 20 MG tablet Take 1 tablet (20 mg total) by mouth daily.  Marland Kitchen spironolactone (ALDACTONE) 25 MG tablet Take 1 tablet (25 mg total) by mouth at bedtime.  . torsemide (DEMADEX) 20 MG tablet Take 2 tablets (40 mg total) by mouth 2 (two) times daily.  . OXYGEN Inhale 3 L into the lungs continuous.     FAMILY HISTORY:   His family history includes Heart disease in his father and mother; Heart failure in his mother; Hypertension in his mother and sister. There is no history of Heart attack or Stroke.  SOCIAL HISTORY:  He  reports that he has been smoking cigarettes. He has a 34.00 pack-year smoking history. He has never used smokeless tobacco. He reports that he does not drink alcohol or use drugs.  REVIEW OF SYSTEMS:    Review of Systems  Unable to perform ROS: Mental status change   60 minutes critical care time  Vernie Murders, MD Pulmonary & Critical Care Medicine Pager: [phone]

## 2018-07-27 ENCOUNTER — Inpatient Hospital Stay (HOSPITAL_COMMUNITY): Payer: Medicare Other

## 2018-07-27 ENCOUNTER — Encounter (HOSPITAL_COMMUNITY): Payer: Medicare Other

## 2018-07-27 DIAGNOSIS — I5023 Acute on chronic systolic (congestive) heart failure: Secondary | ICD-10-CM

## 2018-07-27 DIAGNOSIS — I5043 Acute on chronic combined systolic (congestive) and diastolic (congestive) heart failure: Secondary | ICD-10-CM

## 2018-07-27 LAB — CBC
HEMATOCRIT: 29.1 % — AB (ref 39.0–52.0)
Hemoglobin: 8.6 g/dL — ABNORMAL LOW (ref 13.0–17.0)
MCH: 24.6 pg — AB (ref 26.0–34.0)
MCHC: 29.6 g/dL — AB (ref 30.0–36.0)
MCV: 83.4 fL (ref 78.0–100.0)
Platelets: 377 10*3/uL (ref 150–400)
RBC: 3.49 MIL/uL — ABNORMAL LOW (ref 4.22–5.81)
RDW: 17.9 % — AB (ref 11.5–15.5)
WBC: 7.6 10*3/uL (ref 4.0–10.5)

## 2018-07-27 LAB — GLUCOSE, CAPILLARY
GLUCOSE-CAPILLARY: 104 mg/dL — AB (ref 70–99)
GLUCOSE-CAPILLARY: 123 mg/dL — AB (ref 70–99)
GLUCOSE-CAPILLARY: 167 mg/dL — AB (ref 70–99)
GLUCOSE-CAPILLARY: 200 mg/dL — AB (ref 70–99)
Glucose-Capillary: 100 mg/dL — ABNORMAL HIGH (ref 70–99)
Glucose-Capillary: 129 mg/dL — ABNORMAL HIGH (ref 70–99)
Glucose-Capillary: 131 mg/dL — ABNORMAL HIGH (ref 70–99)

## 2018-07-27 LAB — BASIC METABOLIC PANEL
Anion gap: 10 (ref 5–15)
BUN: 24 mg/dL — AB (ref 8–23)
CHLORIDE: 95 mmol/L — AB (ref 98–111)
CO2: 35 mmol/L — AB (ref 22–32)
Calcium: 8.8 mg/dL — ABNORMAL LOW (ref 8.9–10.3)
Creatinine, Ser: 1.06 mg/dL (ref 0.61–1.24)
GFR calc Af Amer: >60 mL/min (ref >60)
GFR calc non Af Amer: >60 mL/min (ref >60)
GLUCOSE: 114 mg/dL — AB (ref 70–99)
Potassium: 4.2 mmol/L (ref 3.5–5.1)
SODIUM: 140 mmol/L (ref 135–145)

## 2018-07-27 LAB — HEPARIN LEVEL (UNFRACTIONATED): Heparin Unfractionated: 1.01 IU/mL — ABNORMAL HIGH (ref 0.30–0.70)

## 2018-07-27 LAB — APTT: aPTT: 144 seconds — ABNORMAL HIGH (ref 24–36)

## 2018-07-27 MED ORDER — OXYCODONE-ACETAMINOPHEN 5-325 MG PO TABS
1.0000 | ORAL_TABLET | Freq: Four times a day (QID) | ORAL | Status: DC | PRN
  Administered 2018-07-27 – 2018-07-28 (×4): 1 via ORAL
  Filled 2018-07-27 (×4): qty 1

## 2018-07-27 MED ORDER — RIVAROXABAN 20 MG PO TABS
20.0000 mg | ORAL_TABLET | Freq: Every day | ORAL | Status: DC
  Administered 2018-07-27 – 2018-08-02 (×7): 20 mg via ORAL
  Filled 2018-07-27 (×7): qty 1

## 2018-07-27 MED ORDER — CARVEDILOL 6.25 MG PO TABS
6.2500 mg | ORAL_TABLET | Freq: Two times a day (BID) | ORAL | Status: DC
  Administered 2018-07-27 – 2018-08-02 (×12): 6.25 mg via ORAL
  Filled 2018-07-27 (×13): qty 1

## 2018-07-27 MED ORDER — LISINOPRIL 10 MG PO TABS
10.0000 mg | ORAL_TABLET | Freq: Every day | ORAL | Status: DC
  Administered 2018-07-27 – 2018-07-28 (×2): 10 mg via ORAL
  Filled 2018-07-27 (×2): qty 1

## 2018-07-27 MED ORDER — SPIRONOLACTONE 12.5 MG HALF TABLET
12.5000 mg | ORAL_TABLET | Freq: Every day | ORAL | Status: DC
  Administered 2018-07-27: 12.5 mg via ORAL
  Filled 2018-07-27 (×2): qty 1

## 2018-07-27 MED ORDER — ROSUVASTATIN CALCIUM 20 MG PO TABS
20.0000 mg | ORAL_TABLET | Freq: Every day | ORAL | Status: DC
  Administered 2018-07-27 – 2018-08-01 (×6): 20 mg via ORAL
  Filled 2018-07-27 (×6): qty 1

## 2018-07-27 NOTE — Progress Notes (Signed)
ANTICOAGULATION CONSULT NOTE - Follow Up Consult  Pharmacy Consult for heparin Indication: atrial fibrillation  No Known Allergies  Patient Measurements: Height: _0  (188 cm) Weight: 283 lb 4.7 oz (128.5 kg) IBW/kg (Calculated) : 82.2 Heparin Dosing Weight: 110kg  Vital Signs: Temp: 97.8 F (36.6 C) (09/24 0327) Temp Source: Axillary (09/24 0327) BP: 134/76 (09/24 1000) Pulse Rate: 71 (09/24 1000)  Labs: Recent Labs    07/25/18 2159 07/26/18 0059  07/26/18 0440 07/26/18 0631 07/26/18 1157 07/26/18 1543 07/26/18 1702 07/27/18 0223  HGB 9.5*  --   --   --  9.3*  --   --   --  8.6*  HCT 34.5*  --   --   --  32.7*  --   --   --  29.1*  PLT 371  --   --   --  366  --   --   --  377  APTT  --   --    < > 45* 48*  --  109*  --  144*  LABPROT  --   --   --  18.5*  --   --   --   --   --   INR  --   --   --  1.56  --   --   --   --   --   HEPARINUNFRC  --   --   --   --  1.18*  --   --   --  1.01*  CREATININE 1.17 1.15  --  1.06 1.08  --   --  0.97 1.06  TROPONINI  --  0.04*  --   --  0.04* <0.03  --   --   --    < > = values in this interval not displayed.    Estimated Creatinine Clearance: 92.4 mL/min (by C-G formula based on SCr of 1.06 mg/dL).   Medical History: Past Medical History:  Diagnosis Date  . Atrial flutter ([place])    a. recurrent AFlutter with RVR;  b. Amiodarone Rx started 4/16  . CAD (coronary artery disease)    a. LHC 1/16:  mLAD diffuse disease, pLCx mild disease, dLCx with disease but too small for PCI, RCA ok, EF 25-30%  . Chronic pain   . Chronic systolic CHF (congestive heart failure) ([place])   . COPD (chronic obstructive pulmonary disease) ([place])   . Diabetes mellitus without complication ([place])   . Hypercholesteremia   . Hypertension   . NICM (nonischemic cardiomyopathy) ([place])    a.dx 2016. b. 2D echo 06/2016 - Last echo 07/01/16: mod dilated LV, mod LVH, EF 25-30%, mild-mod MR, sev LAE, mild-mod reduced RV systolic function, mild-mod TR, PASP  16mHG.  .Marland KitchenPAF (paroxysmal atrial fibrillation) ([facility])    On amio - ot a candidate for flecainide due to cardiomyopathy, not a candidate for Tikosyn due to prolonged QT, and felt to be a poor candidate for ablation given left atrial size.  . Pulmonary hypertension ([place]   . Tobacco abuse     Assessment: [id number] c/o SOB, admitted for acute respiratory failure w/ hypoxia and hypercapnia, to transition from Xarelto PTA to heparin for Hx PAF/Aflutter; last dose of Xarelto taken 9/21 at 1800.  Patient continues on heparin drip. Transitioning back to home rivaroxaban today. H/H low but relatively stable. No signs/symptoms of bleeding.  Goal of Therapy:  Monitor platelets by anticoagulation protocol: Yes   Plan:  Will transition to rivaroxaban and stop heparin  drip at that time, around lunch time Monitor CBC while on anticoagulant therapy   Claiborne Billings, PharmD PGY2 Cardiology Pharmacy Resident Phone [phone] 07/27/2018 10:14 AM

## 2018-07-27 NOTE — Progress Notes (Signed)
Pharmacist called at [facility] and instructed RN to decrease Heparin gtt to 1500 U/hr- 30m/hr

## 2018-07-27 NOTE — Progress Notes (Addendum)
Advanced Heart Failure Rounding Note  PCP-Cardiologist: No primary care provider on file.   Subjective:    Cr 1.06. K 4.2.    Hgb 9.5 -> 9.3 -> 8.6. No overt bleeding.   Much more alert and awake today. Denies SOB. Remains swollen into thighs.   Negative 2.6 L. Weights inaccurate.   Objective:   Weight Range: 128.5 kg Body mass index is 36.37 kg/m.   Vital Signs:   Temp:  [97.8 F (36.6 C)-99.3 F (37.4 C)] 97.8 F (36.6 C) (09/24 0327) Pulse Rate:  [59-76] 76 (09/24 0700) Resp:  [19-35] 26 (09/24 0700) BP: (115-149)/(55-75) 149/69 (09/24 0700) SpO2:  [94 %-100 %] 95 % (09/24 0700) FiO2 (%):  [30 %-40 %] 30 % (09/23 1531) Weight:  [128.5 kg] 128.5 kg (09/24 0500) Last BM Date: 07/26/18  Weight change: Filed Weights   07/25/18 2151 07/26/18 0415 07/27/18 0500  Weight: 117.9 kg 117.9 kg 128.5 kg    Intake/Output:   Intake/Output Summary (Last 24 hours) at 07/27/2018 0745 Last data filed at 07/27/2018 0700 Gross per 24 hour  Intake 477.27 ml  Output 3135 ml  Net -2657.73 ml      Physical Exam    General: Ill appearing. Awake and alert today.  HEENT: Normal Neck: Supple. JVP to jaw. Carotids 2+ bilat; no bruits. No lymphadenopathy or thyromegaly appreciated. Cor: PMI nondisplaced. Regular rate & rhythm. No rubs, gallops or murmurs. Large bony mass vs very large xyphoid process.  Lungs: Diminished throughout.  Abdomen: Soft, nontender, nondistended. No hepatosplenomegaly. No bruits or masses. Good bowel sounds. Extremities: No cyanosis, clubbing, or rash. 2+ edema into thighs.  Neuro: Alert & oriented x 3. Cranial nerves grossly intact. Moves all 4 extremities w/o difficulty. Affect pleasant    Telemetry   NSR 60-70s, personally reviewed.   EKG    No new tracings.    Labs    CBC Recent Labs    07/25/18 2159 07/26/18 0631 07/27/18 0223  WBC 8.1 5.7 7.6  NEUTROABS 6.1  --   --   HGB 9.5* 9.3* 8.6*  HCT 34.5* 32.7* 29.1*  MCV 90.1 87.9 83.4    PLT 371 366 381   Basic Metabolic Panel Recent Labs    07/26/18 0440 07/26/18 0631 07/26/18 1702 07/27/18 0223  NA 136 137 138 140  K 5.5* 5.7* 5.1 4.2  CL 95* 96* 92* 95*  CO2 30 30 35* 35*  GLUCOSE 148* 147* 139* 114*  BUN _0 24*  CREATININE 1.06 1.08 0.97 1.06  CALCIUM 8.7* 8.8* 8.7* 8.8*  MG 2.1 2.1  --   --   PHOS  --  4.7*  --   --    Liver Function Tests Recent Labs    07/26/18 0631  AST 15  ALT 9  ALKPHOS 95  BILITOT 0.5  PROT 7.1  ALBUMIN 3.0*   No results for input(s): LIPASE, AMYLASE in the last 72 hours. Cardiac Enzymes Recent Labs    07/26/18 0059 07/26/18 0631 07/26/18 1157  TROPONINI 0.04* 0.04* <0.03    BNP: BNP (last 3 results) Recent Labs    03/15/18 2356 04/30/18 2049 07/25/18 2159  BNP 866.2* 1,912.5* 2,064.1*    ProBNP (last 3 results) No results for input(s): PROBNP in the last 8760 hours.   D-Dimer No results for input(s): DDIMER in the last 72 hours. Hemoglobin A1C No results for input(s): HGBA1C in the last 72 hours. Fasting Lipid Panel No results for input(s): CHOL, HDL,  LDLCALC, TRIG, CHOLHDL, LDLDIRECT in the last 72 hours. Thyroid Function Tests Recent Labs    07/26/18 0059  TSH 0.841    Other results:   Imaging     No results found.   Medications:     Scheduled Medications: . arformoterol  15 mcg Nebulization BID  . budesonide (PULMICORT) nebulizer solution  0.5 mg Nebulization BID  . chlorhexidine  15 mL Mouth Rinse BID  . furosemide  80 mg Intravenous BID  . insulin aspart  2-6 Units Subcutaneous Q4H  . latanoprost  1 drop Both Eyes QHS  . mouth rinse  15 mL Mouth Rinse q12n4p     Infusions: . sodium chloride Stopped (07/26/18 2310)  . famotidine (PEPCID) IV Stopped (07/26/18 2207)  . heparin 1,500 Units/hr (07/27/18 0700)     PRN Medications:  sodium chloride, albuterol, hydrALAZINE    Patient Profile   Nicholas Caldwell is a 72 y.o. male e with PMH of chronic systolic  CHF/nonischemic cardiomyopathy Echo 12/13/2016 LVEF 40-45%, COPD on home oxygen, PAF and history of non compliance.   Admitted overnight into 07/26/18 with acute hypoxic hypercarbic resp failure and A/C CHF.  Assessment/Plan   1. Acute on chronic hypoxic hypercarbic resp failure - Improved. Remains on intermittent BiPAP.  - Per wife he was scheduled for sleep study and never went. He has had suspected sleep apnea for years, and has always had positive response to CPAP while in hospital  2. Acute on chronic systolic CHF  - Echo 9/44/96 40-45%, Grade 2 DD, Mod LAE, Mild RV, Mod RAE, PA peak pressure 56 mm Hg. - Echo 04/07/18: EF 40-45%, PA peak pressure 62 mmHg - Volume status remains elevated.  - Continue IV lasix 80 mg BID.  -No spiro or ACE/ARB with hyperK.  - Will not resume BB at this time with decompensation.  - Consider repeat Echo, though in setting of non-compliance may not be necessary.  3. Paroxysmal Afib -Appears to be in NSR/sinus brady. - On Xarelto chronically. Covering with heparin for now.  - CHA2DS2/VASc is 5.  - Has previously failed amioadrone and poor tikosyn candidate with long QT. Now following with EP for possible long QT syndrome.  - Poor ablation candidate with LA size.  - No change to current plan.   4. COPD - Previous Chest CT with Emphysema. - Dr. Opal Sidles as outpatient.  - No active wheeze on exam - On chronic O2. Wean from BiPAP to [place] as tolerated.  5. Tobacco abuse  - Encouraged complete cessation.  6.Hip arthritis - Pt is hoping to schedule hip arthroplasty once cleared by [place] and his PCP.  - Echo had been repeated, with results as above.May need to repeat with decompensation.  -Surgical risk is moderate with 6.6% chance of MI, pulmonary edema, VF, cardiac arrest, or complete heart block according to [facility] Criteria. - Will have PT see today.  7. Mild nonobstructive CAD on LHC 2016 - Continue crestor 20 mg daily  - Xarelto chronic. On heparin  currently. No ASA. No change.  8. Hyperkalemia - K 4.2 this am. Follow with diuresis.   Medication concerns reviewed with patient and pharmacy team. Barriers identified: Non compliance  Length of Stay: 1  Shirley Friar, PA-C  07/27/2018, 7:45 AM  Advanced Heart Failure Team Pager [phone] (M-F; [place])  Please contact [facility] for night-coverage after hours (4p -7a ) and weekends on [email or web address]  Patient seen with PA, agree with the above note.   He  diuresed well last night, much more awake/alert today.  Feels good.  Remains in NSR.   On exam, JVP 12-14 cm, clear lungs, reg S1S2, 1+ edema to knees.   1. Acute on chronic hypercarbic respiratory failure: Suspect OHS/OSA.  Now improved, awake/alert on nasal cannula.  - Will need sleep study as outpatient.  Would be good if we can find a way to get him CPAP at home.  2. COPD: Emphysema on CT chest.  On home oxygen.  Needs to quit smoking.  3. Acute on chronic systolic CHF: Nonischemic cardiomyopathy, nonobstructive CAD on prior cath. Last echo in 6/19 with EF 40-45%, PASP 62 mmHg.  On exam, he remains volume overloaded.  He diuresed well yesterday with IV Lasix.  - Continue Lasix 80 mg IV bid.  - Can restart Coreg at 6.25 mg bid, lisinopril at lower dose 10 mg daily (titrate up), spironolactone at lower dose 12.5 mg daily (titrate up).   4. Suspected long QT syndrome: Refused ICD in past, followed by Dr. Caryl Comes.  5. Hip arthritis: Limiting, awaiting eventual THR when cleared.  6. Atrial fibrillation: Paroxysmal.  He is in NSR today.  Options for treatment are limited with large LA, long QT (no Tikosyn or amiodarone), and nonobstructive CAD.  - Can stop heparin gtt and start Xarelto again today.   Loralie Champagne 07/27/2018 8:12 AM

## 2018-07-27 NOTE — Progress Notes (Signed)
CPAP set up for patient for the night- pt tolerating well at time of set up with a full face mask

## 2018-07-27 NOTE — Progress Notes (Signed)
ANTICOAGULATION CONSULT NOTE - Follow Up Consult  Pharmacy Consult for heparin Indication: atrial fibrillation  No Known Allergies  Patient Measurements: Height: _0  (188 cm) Weight: 259 lb 14.8 oz (117.9 kg) IBW/kg (Calculated) : 82.2 Heparin Dosing Weight: 110kg  Vital Signs: Temp: 97.8 F (36.6 C) (09/24 0327) Temp Source: Axillary (09/24 0327) BP: 131/60 (09/24 0300) Pulse Rate: 70 (09/24 0319)  Labs: Recent Labs    07/25/18 2159 07/26/18 0059  07/26/18 0440 07/26/18 0631 07/26/18 1157 07/26/18 1543 07/26/18 1702 07/27/18 0223  HGB 9.5*  --   --   --  9.3*  --   --   --  8.6*  HCT 34.5*  --   --   --  32.7*  --   --   --  29.1*  PLT 371  --   --   --  366  --   --   --  377  APTT  --   --    < > 45* 48*  --  109*  --  144*  LABPROT  --   --   --  18.5*  --   --   --   --   --   INR  --   --   --  1.56  --   --   --   --   --   HEPARINUNFRC  --   --   --   --  1.18*  --   --   --  1.01*  CREATININE 1.17 1.15  --  1.06 1.08  --   --  0.97 1.06  TROPONINI  --  0.04*  --   --  0.04* <0.03  --   --   --    < > = values in this interval not displayed.    Estimated Creatinine Clearance: 88.5 mL/min (by C-G formula based on SCr of 1.06 mg/dL).   Medical History: Past Medical History:  Diagnosis Date  . Atrial flutter ([place])    a. recurrent AFlutter with RVR;  b. Amiodarone Rx started 4/16  . CAD (coronary artery disease)    a. LHC 1/16:  mLAD diffuse disease, pLCx mild disease, dLCx with disease but too small for PCI, RCA ok, EF 25-30%  . Chronic pain   . Chronic systolic CHF (congestive heart failure) ([place])   . COPD (chronic obstructive pulmonary disease) ([place])   . Diabetes mellitus without complication ([place])   . Hypercholesteremia   . Hypertension   . NICM (nonischemic cardiomyopathy) ([place])    a.dx 2016. b. 2D echo 06/2016 - Last echo 07/01/16: mod dilated LV, mod LVH, EF 25-30%, mild-mod MR, sev LAE, mild-mod reduced RV systolic function, mild-mod TR, PASP  37mHG.  .Marland KitchenPAF (paroxysmal atrial fibrillation) ([facility])    On amio - ot a candidate for flecainide due to cardiomyopathy, not a candidate for Tikosyn due to prolonged QT, and felt to be a poor candidate for ablation given left atrial size.  . [place]monary [place] ([place]   . Tobacco abuse     Assessment: 89yomale c/o SOB, admitted for acute respiratory failure w/ hypoxia and hypercapnia, to transition from Xarelto PTA to heparin for Hx PAF/Aflutter; last dose of Xarelto taken 9/21 at 1800.  Heparin drip 1600 uts/hr.  APTT higher after rate increase.  Drawn appropriately per RN Goal of Therapy:  Heparin level 0.3-0.7 units/ml aPTT 66-102 seconds Monitor platelets by anticoagulation protocol: Yes   Plan:  Decrease heparin gtt to 1500 units/hr  Check aptt in 6hr monitor heparin levels, aPTT (while Xarelto affects anti-Xa), and CBC.  Excell Seltzer, PharmD Clinical Pharmacist 07/27/2018 3:30 AM

## 2018-07-27 NOTE — Progress Notes (Signed)
RT NOTE: RT noticed patient was off bipap and RN stated he has been off since about 6am. RT talked to patient and he said he was not feeling short of breath, answered questions and was sating 98% on 3L [place]. RT left patient off bipap for now and will continue to monitor.

## 2018-07-27 NOTE — Progress Notes (Signed)
PULMONARY / CRITICAL CARE MEDICINE   NAME:  Nicholas Caldwell, MRN:  [id number], DOB:  26-Dec-1945, LOS: 1 ADMISSION DATE:  07/25/2018, CONSULTATION DATE: 07/26/18 REFERRING MD: ER MD, CHIEF COMPLAINT: SOB  BRIEF HISTORY:    CHF exacerbation HISTORY OF PRESENT ILLNESS   51yoM with hx COPD, CHF (EF 25%), Afib (on xarelto), Tobacco absue, Chronic hypoxia (on 3L O2), HTN, DM, Chronic pain, and Noncompliance, presents to the ER c/o SOB and Increasing BLE edema x 4 days, found to be hypoxic on 84% in ER. Pox 98% on BIPAP. In the ER patient found to be hypoxic and CXR showed pulmonary edema. He was started on BIPAP 16/6 with ABG after on BIPAP showing acute-on-chronic hypercapnea (7.27/73/83/34). IPAP increased from 16 to 18, EPAP kept at 6. No lasix or nebs given yet. PCCM then consulted. When I arrived in ER patient is being prepared to leave room. RN says patient going to Head CT for AMS then admitted to [facility]. I halted the transfer, examined patient, then discussed with hospitalist. It seems earlier tonight he was more somnolent and only aroused to sternal rub. At time of my exam however, he arouses to voice and obeys commands, afocal neuro exam. Will hold off on Head CT for now. Will admit patient to ICU since he is on BIPAP and high risk of decompensating and requiring intubation. Discussed plan of care with patient's wife at bedside.   SIGNIFICANT PAST MEDICAL HISTORY   See above   SIGNIFICANT EVENTS:  9/22: presented to ER with SOB and LE edema, diagnosed as CHF exacerbation >> placed on BIPAP  9/23: PCCM consulted >> Admit to ICU  STUDIES:   CXR (9/22): pulmonary edema  CULTURES:  Sputum culture (9/23): ordered  MRSA PCR (9/23) Negative  ANTIBIOTICS:  None   LINES/TUBES:  PIV's Foley catheter 9/23>>  CONSULTANTS:  None   SUBJECTIVE:  States he is feeling better  CONSTITUTIONAL: BP 134/76   Pulse 71   Temp 97.8 F (36.6 C) (Axillary)   Resp (!) 21   Ht _0  (1.88 m)   Wt 128.5  kg   SpO2 94%   BMI 36.37 kg/m  I/O last 3 completed shifts: In: 559.4 [I.V.:461.1; IV Piggyback:98.3] Out: 4185 [Urine:4185]   FiO2 (%):  [30 %-40 %] 30 % PHYSICAL EXAM: General: WDWN Adult male, obese, alert and conversational  on 3 L [place] Neuro:Awake and alert, appropriate, PERRL, Obeying commands. Moving all extremities. Equal motor and sensation bilaterally, no facial droop, tongue midline. Oriented to place only "hospital" , still confused about exact time HEENT:NCAT, Thick neck, MM moist , + JVD Cardiovascular: S1, S2, RRR,  no MRG, SR per tele Lungs: Bilateral chest excursion, Coarse, diminished per bases Abdomen: Obese, soft, NT, ND, BS +, large mid-line old well-healed scar from prior splenctomy Musculoskeletal: 2+ BLE edema, no obvious deformities Skin: no rashes, lesions , warm dry and intact   RESOLVED PROBLEM LIST   ASSESSMENT AND PLAN   1. Acute-on-Chronic Hypercapnic and Hypoxic Respiratory failure; Pulmonary edema; CHF exacerbation,Progressing  Pulmonary HTN: - increased BIPAP from 18/6 to 20/10 since he is obese with thick neck, likely has OSA, and has Vt of only 440 on the 18/6.  Wears 3 L oxygen at home - BiPAP prn>> tolerating periods off - ABG prn - Give Lasix  As ordered - Continue  foley catheter for now - Mag 9/25 - Will need sleep test as outpatient and CPAP/ BiPAP>> PAP have increased over 12 months - Saturation goals  are 88-92% - Mobilize as able - Aggressive pulmonary toilet IS - Close monitor of MS  2. COPD: - no wheezing on my exam; likely not in exacerbation - is on an ICS/LABA at home; will start pulmicort and [place]vana nebs BID, duonebs Q 6  - Saturation goals as above  3. Afib - on xarelto at home; now taking po's we will transition from heparin back to [place]  4. DM - Heart Healthy diet  CBG's  ACHS - ; SSI PRN  5. AKI and Hyperkalemia: - creatinine 1.06 up from baseline 0.96; K 4.2 . Likely AKI related to cardiorenal syndrome and as  such should improve with diuresis.  - Continue lasix as ordered - Trend BMET daily - Monitor UO  SUMMARY OF TODAY'S PLAN:  As above Continue diuresis Improving with periods off BiPAP May be ready for transfer to SDU within next 24 hours if continued improvement Medical compliance and medical literacy are definite barriers to care  Best Practice / Goals of Care / Disposition.   DVT PROPHYLAXIS: heparin gtt SUP: famotidine  NUTRITION: Heart Healthy  MOBILITY: bedrest  GOALS OF CARE: FULL CODE FAMILY DISCUSSIONS: updated patient's wife at bedside  DISPOSITION admit to ICU  LABS  Glucose Recent Labs  Lab 07/26/18 1209 07/26/18 1602 07/26/18 2011 07/26/18 2349 07/27/18 0557 07/27/18 0802  GLUCAP 144* 121* 94 131* 100* 104*    BMET Recent Labs  Lab 07/26/18 0631 07/26/18 1702 07/27/18 0223  NA 137 138 140  K 5.7* 5.1 4.2  CL 96* 92* 95*  CO2 30 35* 35*  BUN 22 22 24*  CREATININE 1.08 0.97 1.06  GLUCOSE 147* 139* 114*   Liver Enzymes Recent Labs  Lab 07/26/18 0631  AST 15  ALT 9  ALKPHOS 95  BILITOT 0.5  ALBUMIN 3.0*    Electrolytes Recent Labs  Lab 07/26/18 0059 07/26/18 0440 07/26/18 0631 07/26/18 1702 07/27/18 0223  CALCIUM 8.5* 8.7* 8.8* 8.7* 8.8*  MG 2.3 2.1 2.1  --   --   PHOS  --   --  4.7*  --   --    CBC Recent Labs  Lab 07/25/18 2159 07/26/18 0631 07/27/18 0223  WBC 8.1 5.7 7.6  HGB 9.5* 9.3* 8.6*  HCT 34.5* 32.7* 29.1*  PLT 371 366 377   ABG Recent Labs  Lab 07/26/18 0251 07/26/18 0343 07/26/18 0625  PHART 7.237* 7.309* 7.363  PCO2ART 82.8* 70.2* 59.8*  PO2ART 37.0* 74.0* 101.0   Coag's Recent Labs  Lab 07/26/18 0440 07/26/18 0631 07/26/18 1543 07/27/18 0223  APTT 45* 48* 109* 144*  INR 1.56  --   --   --     Sepsis Markers No results for input(s): LATICACIDVEN, PROCALCITON, O2SATVEN in the last 168 hours.  Cardiac Enzymes Recent Labs  Lab 07/26/18 0059 07/26/18 0631 07/26/18 1157  TROPONINI 0.04*  0.04* <0.03   PAST MEDICAL HISTORY :   He  has a past medical history of Atrial flutter ([place]), CAD (coronary artery disease), Chronic pain, Chronic systolic CHF (congestive heart failure) ([place]), COPD (chronic obstructive pulmonary disease) ([place]), Diabetes mellitus without complication ([place]), Hypercholesteremia, Hypertension, NICM (nonischemic cardiomyopathy) ([place]), PAF (paroxysmal atrial fibrillation) ([place]), Pulmonary hypertension ([place]), and Tobacco abuse.  PAST SURGICAL HISTORY:  He  has a past surgical history that includes left and right heart catheterization with coronary angiogram (N/A, 11/06/2014) and Splenectomy.  No Known Allergies  No current facility-administered medications on file prior to encounter.    Current Outpatient Medications on File  Prior to Encounter  Medication Sig  . albuterol (PROVENTIL HFA;VENTOLIN HFA) 108 (90 BASE) MCG/ACT inhaler Inhale 2 puffs into the lungs every 6 (six) hours as needed for wheezing or shortness of breath.  . budesonide-formoterol (SYMBICORT) 160-4.5 MCG/ACT inhaler Inhale 2 puffs into the lungs 2 (two) times daily.  . carvedilol (COREG) 12.5 MG tablet Take 1 tablet (12.5 mg total) by mouth 2 (two) times daily with a meal.  . diazepam (VALIUM) 10 MG tablet Take 0.5 tablets (5 mg total) by mouth daily as needed for anxiety or sleep. (Patient taking differently: Take 10 mg by mouth daily as needed for anxiety or sleep. )  . ferrous sulfate 325 (65 FE) MG tablet Take 325 mg by mouth 3 (three) times daily as needed (for supplementation).  . fluticasone (FLONASE) 50 MCG/ACT nasal spray Place 2 sprays into both nostrils daily as needed for allergies.   . folic acid (FOLVITE) 1 MG tablet Take 1 mg by mouth daily.  Marland Kitchen gabapentin (NEURONTIN) 600 MG tablet Take 1 tablet (600 mg total) by mouth 2 (two) times daily.  Marland Kitchen glucose 4 GM chewable tablet Chew 4 tablets by mouth See admin instructions. Chew 4 tablets by mouth as needed for low blood sugar - repeat every  15 minutes if blood sugar less than 70  . guaifenesin (HUMIBID E) 400 MG TABS tablet Take 400 mg by mouth 3 (three) times daily.  . hydrocortisone (ANUSOL-HC) 2.5 % rectal cream Place 1 application rectally 2 (two) times daily as needed for hemorrhoids or itching. Use up to 2 weeks at a time as needed  . hydrocortisone 1 % lotion Apply 1 application topically See admin instructions. Apply small amount to back twice daily for itchy rash  . insulin aspart protamine - aspart (NOVOLOG MIX 70/30 FLEXPEN) (70-30) 100 UNIT/ML FlexPen Inject 30 Units into the skin 2 (two) times daily.   Marland Kitchen ipratropium (ATROVENT) 0.02 % nebulizer solution Take 2.5 mLs (0.5 mg total) by nebulization every 6 (six) hours as needed for wheezing or shortness of breath.  Marland Kitchen ipratropium-albuterol (DUONEB) 0.5-2.5 (3) MG/3ML SOLN Take 3 mLs by nebulization 2 (two) times daily.  Marland Kitchen latanoprost (XALATAN) 0.005 % ophthalmic solution Place 1 drop into both eyes at bedtime.  Marland Kitchen lisinopril (PRINIVIL,ZESTRIL) 20 MG tablet Take 1 tablet (20 mg total) by mouth daily.  . Magnesium Oxide 420 (252 Mg) MG TABS Take 420 mg by mouth 2 (two) times daily.  . metFORMIN (GLUCOPHAGE-XR) 500 MG 24 hr tablet Take 1,000 mg by mouth 2 (two) times daily with a meal.  . oxyCODONE-acetaminophen (PERCOCET) 10-325 MG tablet Take 1 tablet by mouth every 6 (six) hours as needed for pain.  . potassium chloride SA (K-DUR,KLOR-CON) 20 MEQ tablet Take 2 tablets (40 mEq total) by mouth daily.  . rivaroxaban (XARELTO) 20 MG TABS tablet Take 1 tablet (20 mg total) by mouth daily with supper. (Patient taking differently: Take 20 mg by mouth daily at 6 PM. )  . rosuvastatin (CRESTOR) 20 MG tablet Take 1 tablet (20 mg total) by mouth daily.  Marland Kitchen spironolactone (ALDACTONE) 25 MG tablet Take 1 tablet (25 mg total) by mouth at bedtime.  . torsemide (DEMADEX) 20 MG tablet Take 2 tablets (40 mg total) by mouth 2 (two) times daily.  . OXYGEN Inhale 3 L into the lungs continuous.      FAMILY HISTORY:   His family history includes Heart disease in his father and mother; Heart failure in his mother; Hypertension in his  mother and sister. There is no history of Heart attack or Stroke.  SOCIAL HISTORY:  He  reports that he has been smoking cigarettes. He has a 34.00 pack-year smoking history. He has never used smokeless tobacco. He reports that he does not drink alcohol or use drugs.   Magdalen Spatz, AGACNP-BC Pulmonary & Critical Care Medicine Pager: [phone]

## 2018-07-27 NOTE — Progress Notes (Signed)
Pt received to room 3e13 from 2h, pt wife at bedside, pt 3l/[place], vss, foley was dc prior to transfer, condom cath placed per pt request as he just got Iv lasix, call light in reach, reviewed poc

## 2018-07-28 ENCOUNTER — Inpatient Hospital Stay (HOSPITAL_COMMUNITY): Payer: Medicare Other

## 2018-07-28 LAB — BASIC METABOLIC PANEL
ANION GAP: 11 (ref 5–15)
BUN: 26 mg/dL — AB (ref 8–23)
CALCIUM: 8.4 mg/dL — AB (ref 8.9–10.3)
CO2: 35 mmol/L — AB (ref 22–32)
Chloride: 94 mmol/L — ABNORMAL LOW (ref 98–111)
Creatinine, Ser: 1.15 mg/dL (ref 0.61–1.24)
GFR calc Af Amer: >60 mL/min (ref >60)
GFR calc non Af Amer: >60 mL/min (ref >60)
GLUCOSE: 115 mg/dL — AB (ref 70–99)
Potassium: 3.4 mmol/L — ABNORMAL LOW (ref 3.5–5.1)
Sodium: 140 mmol/L (ref 135–145)

## 2018-07-28 LAB — CBC
HEMATOCRIT: 29.3 % — AB (ref 39.0–52.0)
HEMOGLOBIN: 8.7 g/dL — AB (ref 13.0–17.0)
MCH: 25.1 pg — AB (ref 26.0–34.0)
MCHC: 29.7 g/dL — AB (ref 30.0–36.0)
MCV: 84.7 fL (ref 78.0–100.0)
Platelets: 392 10*3/uL (ref 150–400)
RBC: 3.46 MIL/uL — ABNORMAL LOW (ref 4.22–5.81)
RDW: 18.2 % — ABNORMAL HIGH (ref 11.5–15.5)
WBC: 7.5 10*3/uL (ref 4.0–10.5)

## 2018-07-28 LAB — MAGNESIUM: Magnesium: 1.7 mg/dL (ref 1.7–2.4)

## 2018-07-28 LAB — GLUCOSE, CAPILLARY
GLUCOSE-CAPILLARY: 196 mg/dL — AB (ref 70–99)
Glucose-Capillary: 113 mg/dL — ABNORMAL HIGH (ref 70–99)
Glucose-Capillary: 166 mg/dL — ABNORMAL HIGH (ref 70–99)
Glucose-Capillary: 168 mg/dL — ABNORMAL HIGH (ref 70–99)
Glucose-Capillary: 207 mg/dL — ABNORMAL HIGH (ref 70–99)

## 2018-07-28 LAB — BRAIN NATRIURETIC PEPTIDE: B Natriuretic Peptide: 1381.4 pg/mL — ABNORMAL HIGH (ref 0.0–100.0)

## 2018-07-28 MED ORDER — SPIRONOLACTONE 25 MG PO TABS
25.0000 mg | ORAL_TABLET | Freq: Every day | ORAL | Status: DC
  Administered 2018-07-28 – 2018-08-02 (×6): 25 mg via ORAL
  Filled 2018-07-28 (×7): qty 1

## 2018-07-28 MED ORDER — LISINOPRIL 20 MG PO TABS
20.0000 mg | ORAL_TABLET | Freq: Every day | ORAL | Status: DC
  Administered 2018-07-29 – 2018-08-02 (×5): 20 mg via ORAL
  Filled 2018-07-28 (×5): qty 1

## 2018-07-28 MED ORDER — MAGNESIUM OXIDE 400 (241.3 MG) MG PO TABS
200.0000 mg | ORAL_TABLET | Freq: Two times a day (BID) | ORAL | Status: DC
  Administered 2018-07-28 – 2018-08-02 (×11): 200 mg via ORAL
  Filled 2018-07-28 (×11): qty 1

## 2018-07-28 MED ORDER — OXYCODONE-ACETAMINOPHEN 5-325 MG PO TABS
1.0000 | ORAL_TABLET | Freq: Four times a day (QID) | ORAL | Status: DC | PRN
  Administered 2018-07-28: 1 via ORAL
  Administered 2018-07-28: 2 via ORAL
  Administered 2018-07-29: 1 via ORAL
  Filled 2018-07-28 (×2): qty 1
  Filled 2018-07-28: qty 2

## 2018-07-28 MED ORDER — POTASSIUM CHLORIDE CRYS ER 20 MEQ PO TBCR
40.0000 meq | EXTENDED_RELEASE_TABLET | Freq: Once | ORAL | Status: AC
  Administered 2018-07-28: 40 meq via ORAL
  Filled 2018-07-28: qty 2

## 2018-07-28 MED ORDER — LISINOPRIL 10 MG PO TABS
10.0000 mg | ORAL_TABLET | Freq: Once | ORAL | Status: AC
  Administered 2018-07-28: 10 mg via ORAL
  Filled 2018-07-28: qty 1

## 2018-07-28 MED ORDER — MAGNESIUM SULFATE 4 GM/100ML IV SOLN
4.0000 g | Freq: Once | INTRAVENOUS | Status: AC
  Administered 2018-07-28: 4 g via INTRAVENOUS
  Filled 2018-07-28: qty 100

## 2018-07-28 MED ORDER — INSULIN ASPART 100 UNIT/ML ~~LOC~~ SOLN
0.0000 [IU] | Freq: Three times a day (TID) | SUBCUTANEOUS | Status: DC
  Administered 2018-07-28 – 2018-07-29 (×2): 2 [IU] via SUBCUTANEOUS
  Administered 2018-07-29: 1 [IU] via SUBCUTANEOUS
  Administered 2018-07-29 – 2018-07-30 (×2): 2 [IU] via SUBCUTANEOUS
  Administered 2018-07-31: 1 [IU] via SUBCUTANEOUS
  Administered 2018-07-31 (×2): 2 [IU] via SUBCUTANEOUS
  Administered 2018-08-01: 1 [IU] via SUBCUTANEOUS
  Administered 2018-08-01: 3 [IU] via SUBCUTANEOUS
  Administered 2018-08-01 – 2018-08-02 (×2): 2 [IU] via SUBCUTANEOUS
  Administered 2018-08-02: 3 [IU] via SUBCUTANEOUS

## 2018-07-28 MED ORDER — FAMOTIDINE 20 MG PO TABS
20.0000 mg | ORAL_TABLET | Freq: Every day | ORAL | Status: DC
  Administered 2018-07-29 – 2018-08-02 (×5): 20 mg via ORAL
  Filled 2018-07-28 (×5): qty 1

## 2018-07-28 NOTE — Progress Notes (Signed)
PT came to get nurse to evaluate. Reports pt said he was at the [place] and it was 1996 and seemed out of it , pt was awake watching tv in bed, he says wife had to go pay some bills and will be back, he says he is at [facility] and here for his heart stuff, pt says "he does not remember saying he was at the [place] but maybe I did" no distress noted

## 2018-07-28 NOTE — Progress Notes (Signed)
To the best of my knowledge, documentation by Aletta Edouard,  [employer] nursing student is correct.

## 2018-07-28 NOTE — Progress Notes (Addendum)
Advanced Heart Failure Rounding Note  PCP-Cardiologist: No primary care provider on file.   Subjective:    1.15, K 3.4. I/O's negative 3.3 L yesterday. Weights not accurate.  Hgb 9.5 -> 9.3 -> 8.6 -> 8.7. No overt bleeding.   Temp 99.4 this am. WBC 7.5. CXR looks improved today. Wore CPAP overnight.  HF meds restarted at low doses yesterday. SBP 110-130s.  Feeling better today. Walked with no SOB or dizziness.   Objective:   Weight Range: 129.1 kg Body mass index is 36.54 kg/m.   Vital Signs:   Temp:  [97.9 F (36.6 C)-99.4 F (37.4 C)] 99.4 F (37.4 C) (09/25 0738) Pulse Rate:  [69-87] 70 (09/25 0738) Resp:  [15-29] 15 (09/25 0738) BP: (106-156)/(58-89) 132/65 (09/25 0738) SpO2:  [91 %-100 %] 96 % (09/25 0738) Weight:  [129.1 kg] 129.1 kg (09/25 0457) Last BM Date: 07/27/18  Weight change: Filed Weights   07/26/18 0415 07/27/18 0500 07/28/18 0457  Weight: 117.9 kg 128.5 kg 129.1 kg    Intake/Output:   Intake/Output Summary (Last 24 hours) at 07/28/2018 0826 Last data filed at 07/28/2018 0700 Gross per 24 hour  Intake 521.78 ml  Output 3850 ml  Net -3328.22 ml      Physical Exam    General: Ill appearing. No resp difficulty. HEENT: Normal Neck: Supple. JVP ~10. Carotids 2+ bilat; no bruits. No thyromegaly or nodule noted. Cor: PMI nondisplaced. RRR, No M/G/R noted. Large bony mass vs very large xyphoid process Lungs: wheezing throughout. On 3 L [place] Abdomen: Soft, non-tender, non-distended, no HSM. No bruits or masses. +BS  Extremities: No cyanosis, clubbing, or rash. R and LLE 1-2+ edema to knees Neuro: Alert & orientedx3, cranial nerves grossly intact. moves all 4 extremities w/o difficulty. Affect pleasant  Telemetry   NSR 70s, personally reviewed.   EKG    No new tracings.    Labs    CBC Recent Labs    07/25/18 2159  07/27/18 0223 07/28/18 0501  WBC 8.1   < > 7.6 7.5  NEUTROABS 6.1  --   --   --   HGB 9.5*   < > 8.6* 8.7*  HCT  34.5*   < > 29.1* 29.3*  MCV 90.1   < > 83.4 84.7  PLT 371   < > 377 392   < > = values in this interval not displayed.   Basic Metabolic Panel Recent Labs    07/26/18 0631  07/27/18 0223 07/28/18 0501  NA 137   < > 140 140  K 5.7*   < > 4.2 3.4*  CL 96*   < > 95* 94*  CO2 30   < > 35* 35*  GLUCOSE 147*   < > 114* 115*  BUN 22   < > 24* 26*  CREATININE 1.08   < > 1.06 1.15  CALCIUM 8.8*   < > 8.8* 8.4*  MG 2.1  --   --  1.7  PHOS 4.7*  --   --   --    < > = values in this interval not displayed.   Liver Function Tests Recent Labs    07/26/18 0631  AST 15  ALT 9  ALKPHOS 95  BILITOT 0.5  PROT 7.1  ALBUMIN 3.0*   No results for input(s): LIPASE, AMYLASE in the last 72 hours. Cardiac Enzymes Recent Labs    07/26/18 0059 07/26/18 0631 07/26/18 1157  TROPONINI 0.04* 0.04* <0.03    BNP: BNP (  last 3 results) Recent Labs    04/30/18 2049 07/25/18 2159 07/28/18 0501  BNP 1,912.5* 2,064.1* 1,381.4*    ProBNP (last 3 results) No results for input(s): PROBNP in the last 8760 hours.   D-Dimer No results for input(s): DDIMER in the last 72 hours. Hemoglobin A1C No results for input(s): HGBA1C in the last 72 hours. Fasting Lipid Panel No results for input(s): CHOL, HDL, LDLCALC, TRIG, CHOLHDL, LDLDIRECT in the last 72 hours. Thyroid Function Tests Recent Labs    07/26/18 0059  TSH 0.841    Other results:   Imaging    No results found.   Medications:     Scheduled Medications: . arformoterol  15 mcg Nebulization BID  . budesonide (PULMICORT) nebulizer solution  0.5 mg Nebulization BID  . carvedilol  6.25 mg Oral BID WC  . chlorhexidine  15 mL Mouth Rinse BID  . furosemide  80 mg Intravenous BID  . insulin aspart  2-6 Units Subcutaneous Q4H  . latanoprost  1 drop Both Eyes QHS  . lisinopril  10 mg Oral Daily  . mouth rinse  15 mL Mouth Rinse q12n4p  . potassium chloride  40 mEq Oral Once  . rivaroxaban  20 mg Oral Daily  . rosuvastatin  20  mg Oral q1800  . spironolactone  12.5 mg Oral Daily    Infusions: . sodium chloride 250 mL (07/27/18 2159)  . famotidine (PEPCID) IV 20 mg (07/27/18 2200)    PRN Medications: sodium chloride, albuterol, hydrALAZINE, oxyCODONE-acetaminophen    Patient Profile   Nicholas Caldwell is a 72 y.o. male e with PMH of chronic systolic CHF/nonischemic cardiomyopathy Echo 12/13/2016 LVEF 40-45%, COPD on home oxygen, PAF and history of non compliance.   Admitted overnight into 07/26/18 with acute hypoxic hypercarbic resp failure and A/C CHF.  Assessment/Plan   1. Acute on chronic hypoxic hypercarbic resp failure - Improved. On 3 L [place] this am, CPAP overnight. CXR looks better. - Per wife he was scheduled for sleep study and never went. He has had suspected sleep apnea for years, and has always had positive response to CPAP while in hospital  2. Acute on chronic systolic CHF  - Echo 12/16/06 40-45%, Grade 2 DD, Mod LAE, Mild RV, Mod RAE, PA peak pressure 56 mm Hg. - Echo 04/07/18: EF 40-45%, PA peak pressure 62 mmHg - Volume status remains elevated. - Continue IV lasix 80 mg BID. Probably can go to PO tomorrow. On torsemide 40 mg BID at home. - Increase spiro to 25 mg daily - Continue lisinopril 10 mg daily - Continue coreg 6.25 mg BID. - Consider repeat Echo, though in setting of non-compliance may not be necessary.  3. Paroxysmal Afib -Appears to be in NSR/sinus brady. - Back on xarelto. Continue. Hemoglobin 8.7. - CHA2DS2/VASc is 5.  - Has previously failed amioadrone and poor tikosyn candidate with long QT. Now following with EP for possible long QT syndrome.  - Poor ablation candidate with LA size.  4. COPD - Previous Chest CT with Emphysema. - Dr. Opal Sidles as outpatient.  - No active wheeze on exam - On chronic O2. On 3 L [place] this am, CPAP overnight.  5. Tobacco abuse  - Encouraged complete cessation. No change. 6.Hip arthritis - Pt is hoping to schedule hip arthroplasty once  cleared by [place] and his PCP.  - Echo had been repeated, with results as above.May need to repeat with decompensation.  -Surgical risk is moderate with 6.6% chance of MI, pulmonary edema,  VF, cardiac arrest, or complete heart block according to [facility] Criteria. - PT consulted. 7. Mild nonobstructive CAD on LHC 2016 - Continue crestor 20 mg daily and xarelto. 8. Hyperkalemia > hypokalemia / hypomagnesiumia - K 3.4, Mag 1.7 today. Supp.  Medication concerns reviewed with patient and pharmacy team. Barriers identified: Non compliance  Length of Stay: [place], NP  07/28/2018, 8:26 AM  Advanced Heart Failure Team Pager [phone] (M-F; 7a - 4p)  Please contact [facility] for night-coverage after hours (4p -7a ) and weekends on [email or web address]  Patient seen with NP, agree with the above note.   He diuresed well again last night, used CPAP and awake/alert today.  Feels good.  Remains in NSR.   On exam, JVP still 12-14 cm, clear lungs, reg S1S2, 1+ edema to knees.   1. Acute on chronic hypercarbic respiratory failure: Suspect OHS/OSA.  Now improved, awake/alert on nasal cannula.  - Will need sleep study as outpatient.  Would be good if we can find a way to get him CPAP at home.  2. COPD: Emphysema on CT chest.  On home oxygen.  Needs to quit smoking.  3. Acute on chronic systolic CHF: Nonischemic cardiomyopathy, nonobstructive CAD on prior cath. Last echo in 6/19 with EF 40-45%, PASP 62 mmHg.  On exam, he remains volume overloaded today.  He diuresed well again yesterday with IV Lasix and creatinine is stable.  - Continue Lasix 80 mg IV bid today.  - Continue Coreg 6.25 mg bid today.  Increase lisinopril to prior dose 20 mg daily and spironolactone to 25 mg daily.  Can increase Coreg back to 12.5 mg daily tomorrow when he is more diuresed.    4. Suspected long QT syndrome: Refused ICD in past, followed by Dr. Caryl Comes.  5. Hip arthritis: Limiting, awaiting eventual THR when cleared.  6.  Atrial fibrillation: Paroxysmal.  He is in NSR today.  Options for treatment are limited with large LA, long QT (no Tikosyn or amiodarone), and nonobstructive CAD.  - Continue Xarelto.  Out of bed.   Loralie Champagne 07/28/2018 10:23 AM

## 2018-07-28 NOTE — Progress Notes (Signed)
When nurse came in to give eveningmeds " why do you all tell me call to get up but then nobody comes and when I get up I get fussed at for getting up by myself" I apologized and explained I was not sure what happened but I would pass along to front desk his concerns, wife now at bedside, was explaining concerns with resp and lethargy with 2 percocet and that Dr Tyrell Antonio wanted to monitor for any issues esp since pt has resp failure etc, I was explaining he was reported to have confusion and was falling asleep sitting on the side of the bed . Pt said" I cant believe you gonna believe these other people . I thought you was a good person and I was not falling asleep" I explained we just had to monitor and that when I came to assess him after the PT concern , he was alert and oriented and appropriate, pt appeared agitated and requested nurse to leave, nurse left after giving pm meds

## 2018-07-28 NOTE — Care Management Note (Signed)
Case Management Note  Patient Details  Name: Nicholas Caldwell MRN: [id number] Date of Birth: 1946-09-08  Subjective/Objective:                    Action/Plan:  Spoke w patient at bedside. He states he is form home has home oxygen through [facility], PCP is [name] at [place] ext [phone].  He states he has been working with [place] for 6 months to try to get CPAP. CM reviewed chart, patient admitted with hypercapnic resp failure with Pco2 >70.  Discussed with Dr Tyrell Antonio that patient would qualify for NIV/ BiPAP for home. She deferred this to PCCM, Dr Elsworth Soho. Spoke w Dr Elsworth Soho and discussed concerns with getting patient CPAP prior to DC, as he does not have sleep study that is required by Medicare. CM suggested NIV/ BiPAP for home use that could be sent home with patient at DC. DR Elsworth Soho stated [facility] and [facility] as [facility], would not need CPAP prior to DC. Would not write for NIV/ BiPAP. [facility] who will [facility] for [facility] Specialist through [name] as next step for them to provide patient with CPAP as outpatient.   Expected Discharge Date:                  Expected Discharge Plan:     In-House Referral:     Discharge planning Services  CM Consult  Post Acute Care Choice:    Choice offered to:     DME Arranged:    DME Agency:     HH Arranged:    HH Agency:     Status of Service:  In process, will continue to follow  If discussed at Long Length of Stay Meetings, dates discussed:    Additional Comments:  Carles Collet, RN 07/28/2018, 1:46 PM

## 2018-07-28 NOTE — Progress Notes (Addendum)
PROGRESS NOTE    Nicholas Caldwell  ATF:[id number] DOB: 01-22-1946 DOA: 07/25/2018 PCP: [facility]    Brief Narrative: 11yoM with hx COPD, CHF (EF 25%), Afib (on xarelto), Tobacco absue, Chronic hypoxia (on 3L O2), HTN, DM, Chronic pain, and Noncompliance, presents to the ER c/o SOB and Increasing BLE edema x 4 days, found to be hypoxic on 84% in ER. Pox 98% on BIPAP. In the ER patient found to be hypoxic and CXR showed pulmonary edema. He was started on BIPAP 16/6 with ABG after on BIPAP showing acute-on-chronic hypercapnea (7.27/73/83/34). IPAP increased from 16 to 18, EPAP kept at 6. No lasix or nebs given yet. PCCM then consulted. When I arrived in ER patient is being prepared to leave room. RN says patient going to Head CT for AMS then admitted to [facility]. I halted the transfer, examined patient, then discussed with hospitalist. It seems earlier tonight he was more somnolent and only aroused to sternal rub. At time of my exam however, he arouses to voice and obeys commands, afocal neuro exam. Will hold off on Head CT for now. Will admit patient to ICU since he is on BIPAP and high risk of decompensating and requiring intubation. Discussed plan of care with patient's wife at bedside.     Assessment & Plan:   Principal Problem:   Acute respiratory failure with hypoxia and hypercapnia (HCC) Active Problems:   Type 2 diabetes mellitus with neuropathy   PAF (paroxysmal atrial fibrillation) (HCC)   Nonischemic cardiomyopathy (HCC)   CAD - Non-obstructive by YHC6/23   Chronic systolic CHF (congestive heart failure) (HCC)   COPD (chronic obstructive pulmonary disease) (HCC)   OSA (obstructive sleep apnea)   Pulmonary hypertension (HCC)   Acute exacerbation of CHF (congestive heart failure) (HCC)   1-Acute-on-Chronic Hypercapnic and Hypoxic Respiratory failure; Pulmonary edema; CHF exacerbation,Progressing  Pulmonary HTN: Likely OSA, needs CPAP. CM consulted.  Required BIPAP on  admission.  Of BIPA.  CPAP at night.  On IV lasix. Heart failure team following.   Acute systolic HF exacerbation.  On lisinopril. Spironolactone.  IV lasix.  HF team following.  Negative 8 L.  Weight; 260---283--284 Chest x ray; stable vascular congestion, left infiltrate. Effusion.   COPD;  Continue with nebulizer.   A fib; continue with xarelto.   DM;  SSI.   AKI, hyperkalemia; resolved.   Chronic pain management. Percocet PR.   Anemia; monitor hb.  Check anemia panel.   DVT prophylaxis: Xarelto.  Code Status: full code.  Family Communication: care discussed with patient, wife Disposition Plan: remian in the hospital for IV lasix.   Consultants:  Cardiology CCM   Procedures:   none  Antimicrobials:  none  Subjective: He is breathing better. Denies chest pain   Objective: Vitals:   07/28/18 0457 07/28/18 0738 07/28/18 0827 07/28/18 1356  BP:  132/65  108/67  Pulse:  70  70  Resp:  15  16  Temp:  99.4 F (37.4 C)  98.5 F (36.9 C)  TempSrc:  Oral  Oral  SpO2: 91% 96% 92% 96%  Weight: 129.1 kg     Height:        Intake/Output Summary (Last 24 hours) at 07/28/2018 1629 Last data filed at 07/28/2018 1600 Gross per 24 hour  Intake 1040 ml  Output 2900 ml  Net -1860 ml   Filed Weights   07/26/18 0415 07/27/18 0500 07/28/18 0457  Weight: 117.9 kg 128.5 kg 129.1 kg    Examination:  General  exam: Appears calm and comfortable  Respiratory system: Bilateral crackles  Cardiovascular system: S1 & S2 heard, RRR. No JVD, murmurs, rubs, gallops or clicks. No pedal edema. Gastrointestinal system: Abdomen is nondistended, soft and nontender. No organomegaly or masses felt. Normal bowel sounds heard. Central nervous system: Alert and oriented. No focal neurological deficits. Extremities: Symmetric 5 x 5 power. Skin: No rashes, lesions or ulcers Psychiatry: Judgement and insight appear normal. Mood & affect appropriate.     Data Reviewed: I have  personally reviewed following labs and imaging studies  CBC: Recent Labs  Lab 07/25/18 2159 07/26/18 0631 07/27/18 0223 07/28/18 0501  WBC 8.1 5.7 7.6 7.5  NEUTROABS 6.1  --   --   --   HGB 9.5* 9.3* 8.6* 8.7*  HCT 34.5* 32.7* 29.1* 29.3*  MCV 90.1 87.9 83.4 84.7  PLT 371 366 377 131   Basic Metabolic Panel: Recent Labs  Lab 07/26/18 0059 07/26/18 0440 07/26/18 0631 07/26/18 1702 07/27/18 0223 07/28/18 0501  NA 136 136 137 138 140 140  K 5.8* 5.5* 5.7* 5.1 4.2 3.4*  CL 95* 95* 96* 92* 95* 94*  CO2 _0 35* 35* 35*  GLUCOSE 119* 148* 147* 139* 114* 115*  BUN _1 24* 26*  CREATININE 1.15 1.06 1.08 0.97 1.06 1.15  CALCIUM 8.5* 8.7* 8.8* 8.7* 8.8* 8.4*  MG 2.3 2.1 2.1  --   --  1.7  PHOS  --   --  4.7*  --   --   --    GFR: Estimated Creatinine Clearance: 85.4 mL/min (by C-G formula based on SCr of 1.15 mg/dL). Liver Function Tests: Recent Labs  Lab 07/26/18 0631  AST 15  ALT 9  ALKPHOS 95  BILITOT 0.5  PROT 7.1  ALBUMIN 3.0*   No results for input(s): LIPASE, AMYLASE in the last 168 hours. Recent Labs  Lab 07/26/18 0440  AMMONIA 42*   Coagulation Profile: Recent Labs  Lab 07/26/18 0440  INR 1.56   Cardiac Enzymes: Recent Labs  Lab 07/26/18 0059 07/26/18 0631 07/26/18 1157  TROPONINI 0.04* 0.04* <0.03   BNP (last 3 results) No results for input(s): PROBNP in the last 8760 hours. HbA1C: No results for input(s): HGBA1C in the last 72 hours. CBG: Recent Labs  Lab 07/27/18 2036 07/27/18 2355 07/28/18 0440 07/28/18 0935 07/28/18 1128  GLUCAP 129* 200* 113* 196* 166*   Lipid Profile: No results for input(s): CHOL, HDL, LDLCALC, TRIG, CHOLHDL, LDLDIRECT in the last 72 hours. Thyroid Function Tests: Recent Labs    07/26/18 0059  TSH 0.841   Anemia Panel: No results for input(s): VITAMINB12, FOLATE, FERRITIN, TIBC, IRON, RETICCTPCT in the last 72 hours. Sepsis Labs: No results for input(s): PROCALCITON, LATICACIDVEN in the  last 168 hours.  Recent Results (from the past 240 hour(s))  MRSA PCR Screening     Status: None   Collection Time: 07/26/18  4:04 AM  Result Value Ref Range Status   MRSA by PCR NEGATIVE NEGATIVE Final    Comment:        The GeneXpert MRSA Assay (FDA approved for NASAL specimens only), is one component of a comprehensive MRSA colonization surveillance program. It is not intended to diagnose MRSA infection nor to guide or monitor treatment for MRSA infections. Performed at [facility], [place] [address] [place], [place] [address]          Radiology Studies: Dg Chest Port 1 View  Result Date: 07/28/2018 CLINICAL DATA:  Respiratory failure EXAM: PORTABLE CHEST 1 VIEW COMPARISON:  07/27/2018 FINDINGS: Cardiac shadow remains enlarged. Aortic calcifications are again seen. Vascular congestion is again noted. Left basilar infiltrate with associated effusion is again seen. No bony abnormality is noted. IMPRESSION: Stable vascular congestion. Stable left basilar infiltrate with associated effusion. Electronically Signed   By: Inez Catalina M.D.   On: 07/28/2018 11:09   Dg Chest Port 1 View  Result Date: 07/27/2018 CLINICAL DATA:  Shortness of breath.  Pulmonary edema EXAM: PORTABLE CHEST 1 VIEW COMPARISON:  Chest radiograph 07/25/2018 FINDINGS: Stable cardiomegaly. Diffuse bilateral interstitial opacities. Small left pleural effusion with underlying opacities. IMPRESSION: Cardiomegaly and interstitial edema, somewhat worsened from prior. Small left pleural effusion and underlying opacities. Electronically Signed   By: Lovey Newcomer M.D.   On: 07/27/2018 10:22        Scheduled Meds: . arformoterol  15 mcg Nebulization BID  . budesonide (PULMICORT) nebulizer solution  0.5 mg Nebulization BID  . carvedilol  6.25 mg Oral BID WC  . chlorhexidine  15 mL Mouth Rinse BID  . [START ON 07/29/2018] famotidine  20 mg Oral Daily  . furosemide  80 mg Intravenous BID  . insulin aspart  0-9  Units Subcutaneous TID WC  . latanoprost  1 drop Both Eyes QHS  . [START ON 07/29/2018] lisinopril  20 mg Oral Daily  . magnesium oxide  200 mg Oral BID  . mouth rinse  15 mL Mouth Rinse q12n4p  . rivaroxaban  20 mg Oral Daily  . rosuvastatin  20 mg Oral q1800  . spironolactone  25 mg Oral Daily   Continuous Infusions: . sodium chloride 250 mL (07/27/18 2159)     LOS: 2 days    Time spent: 35 minutes.     Elmarie Shiley, MD [facility] Pager [phone]  If 7PM-7AM, please contact night-coverage [email or web address] Password [facility] 07/28/2018, 4:29 PM

## 2018-07-28 NOTE — Evaluation (Addendum)
Physical Therapy Evaluation Patient Details Name: Nicholas Caldwell MRN: [id number] DOB: [birth year] Today's Date: 07/28/2018   History of Present Illness  72 y.o. male Admitted overnight into 07/26/18 with acute hypoxic hypercarbic resp failure and A/C CHF. PMH include: COPD, CHF, CAD, a flutter, PAF, pulmonary HTN, HTN,      Clinical Impression  Pt admitted with above diagnosis. Pt currently with functional limitations due to the deficits listed below (see PT Problem List). Pt confused and lethargic this visit upon entry, asking me who let me into his house 4x throughout 20 minutes. Pt not at baseline per nurse tech, RN notified. Pt reports he uses RW at times at home and lives with wife in 1 level house. Pt with slow processing commands and fallig asleep EOB. Adamantly denying ability to stand, nurse tech informs me he has walked to bathroom today and back. Pt needed hands on assistance for bed mobility and sitting balance today. Limited eval will see again tomorrow and update recs if appropriate.  Pt will benefit from skilled PT to increase their independence and safety with mobility to allow discharge to the venue listed below.    SpO2 86% on 3L, WNL on 4L     Follow Up Recommendations SNF(may progress to HHPT )    Equipment Recommendations  (TBD)    Recommendations for Other Services       Precautions / Restrictions        Mobility  Bed Mobility Overal bed mobility: Needs Assistance Bed Mobility: Supine to Sit     Supine to sit: Supervision;Min guard        Transfers                 General transfer comment: Pt adamantly denying OOB mobility  Ambulation/Gait                Stairs            Wheelchair Mobility    Modified Rankin (Stroke Patients Only)       Balance                                             Pertinent Vitals/Pain Pain Assessment: Faces Faces Pain Scale: Hurts little more Pain Location: R hip and  stomach Pain Intervention(s): Limited activity within patient's tolerance;Monitored during session;Premedicated before session    Home Living Family/patient expects to be discharged to:: Private residence Living Arrangements: Spouse/significant other Available Help at Discharge: Family;Available 24 hours/day Type of Home: House Home Access: Stairs to enter Entrance Stairs-Rails: None Entrance Stairs-Number of Steps: 1 Home Layout: One level Home Equipment: Walker - 2 wheels;Bedside commode;Cane - single point;Shower seat Additional Comments: on 3L of O2 at all times at home    Prior Function Level of Independence: Needs assistance   Gait / Transfers Assistance Needed: uses RW or cane   ADL's / Homemaking Assistance Needed: assist from wife for LB ADLs, meals, housework        Hand Dominance        Extremity/Trunk Assessment   Upper Extremity Assessment Upper Extremity Assessment: Generalized weakness    Lower Extremity Assessment Lower Extremity Assessment: Generalized weakness       Communication      Cognition Arousal/Alertness: Suspect due to medications  General Comments: Thought I was in his house asking who let me in x4 times during session. Delayed processing        General Comments      Exercises     Assessment/Plan    PT Assessment Patient needs continued PT services  PT Problem List Decreased strength;Decreased range of motion;Decreased activity tolerance;Decreased balance;Decreased mobility       PT Treatment Interventions DME instruction;Gait training;Stair training;Functional mobility training;Therapeutic activities;Therapeutic exercise;Balance training    PT Goals (Current goals can be found in the Care Plan section)  Acute Rehab PT Goals Patient Stated Goal: rest PT Goal Formulation: With patient Time For Goal Achievement: 08/04/18 Potential to Achieve Goals: Good    Frequency Min  3X/week   Barriers to discharge        Co-evaluation               AM-PAC PT "6 Clicks" Daily Activity  Outcome Measure Difficulty turning over in bed (including adjusting bedclothes, sheets and blankets)?: Unable Difficulty moving from lying on back to sitting on the side of the bed? : Unable Difficulty sitting down on and standing up from a chair with arms (e.g., wheelchair, bedside commode, etc,.)?: Unable Help needed moving to and from a bed to chair (including a wheelchair)?: Total Help needed walking in hospital room?: Total Help needed climbing 3-5 steps with a railing? : Total 6 Click Score: 6    End of Session Equipment Utilized During Treatment: Gait belt Activity Tolerance: Patient tolerated treatment well Patient left: in bed Nurse Communication: Mobility status PT Visit Diagnosis: Unsteadiness on feet (R26.81);Pain Pain - Right/Left: Right Pain - part of body: Hip    Time: 1779-3903 PT Time Calculation (min) (ACUTE ONLY): 34 min   Charges:   PT Evaluation $PT Eval Moderate Complexity: 1 Mod PT Treatments $Therapeutic Activity: 8-22 mins        Reinaldo Berber, PT, DPT Acute Rehabilitation Services Pager: [phone] Office: [phone]    Reinaldo Berber 07/28/2018, 6:02 PM

## 2018-07-29 DIAGNOSIS — I483 Typical atrial flutter: Secondary | ICD-10-CM

## 2018-07-29 LAB — FERRITIN: Ferritin: 16 ng/mL — ABNORMAL LOW (ref 24–336)

## 2018-07-29 LAB — RETICULOCYTES
RBC.: 3.56 MIL/uL — ABNORMAL LOW (ref 4.22–5.81)
RETIC CT PCT: 2.3 % (ref 0.4–3.1)
Retic Count, Absolute: 81.9 10*3/uL (ref 19.0–186.0)

## 2018-07-29 LAB — VITAMIN B12: VITAMIN B 12: 235 pg/mL (ref 180–914)

## 2018-07-29 LAB — BASIC METABOLIC PANEL
ANION GAP: 9 (ref 5–15)
BUN: 17 mg/dL (ref 8–23)
CHLORIDE: 94 mmol/L — AB (ref 98–111)
CO2: 36 mmol/L — ABNORMAL HIGH (ref 22–32)
Calcium: 8.3 mg/dL — ABNORMAL LOW (ref 8.9–10.3)
Creatinine, Ser: 1.03 mg/dL (ref 0.61–1.24)
GFR calc Af Amer: >60 mL/min (ref >60)
GLUCOSE: 180 mg/dL — AB (ref 70–99)
Potassium: 3.5 mmol/L (ref 3.5–5.1)
Sodium: 139 mmol/L (ref 135–145)

## 2018-07-29 LAB — CBC
HEMATOCRIT: 30.4 % — AB (ref 39.0–52.0)
HEMOGLOBIN: 8.9 g/dL — AB (ref 13.0–17.0)
MCH: 25 pg — ABNORMAL LOW (ref 26.0–34.0)
MCHC: 29.3 g/dL — AB (ref 30.0–36.0)
MCV: 85.4 fL (ref 78.0–100.0)
Platelets: 382 10*3/uL (ref 150–400)
RBC: 3.56 MIL/uL — ABNORMAL LOW (ref 4.22–5.81)
RDW: 18.2 % — ABNORMAL HIGH (ref 11.5–15.5)
WBC: 8 10*3/uL (ref 4.0–10.5)

## 2018-07-29 LAB — GLUCOSE, CAPILLARY
GLUCOSE-CAPILLARY: 191 mg/dL — AB (ref 70–99)
GLUCOSE-CAPILLARY: 203 mg/dL — AB (ref 70–99)
Glucose-Capillary: 148 mg/dL — ABNORMAL HIGH (ref 70–99)
Glucose-Capillary: 180 mg/dL — ABNORMAL HIGH (ref 70–99)

## 2018-07-29 LAB — IRON AND TIBC
IRON: 23 ug/dL — AB (ref 45–182)
SATURATION RATIOS: 6 % — AB (ref 17.9–39.5)
TIBC: 368 ug/dL (ref 250–450)
UIBC: 345 ug/dL

## 2018-07-29 LAB — FOLATE: FOLATE: 7.4 ng/mL (ref >5.9)

## 2018-07-29 LAB — MAGNESIUM: MAGNESIUM: 2 mg/dL (ref 1.7–2.4)

## 2018-07-29 MED ORDER — SODIUM CHLORIDE 0.9 % IV SOLN
510.0000 mg | Freq: Once | INTRAVENOUS | Status: DC
  Filled 2018-07-29: qty 17

## 2018-07-29 MED ORDER — DOXYCYCLINE HYCLATE 100 MG PO TABS
100.0000 mg | ORAL_TABLET | Freq: Two times a day (BID) | ORAL | Status: DC
  Administered 2018-07-29 – 2018-08-02 (×9): 100 mg via ORAL
  Filled 2018-07-29 (×9): qty 1

## 2018-07-29 MED ORDER — POTASSIUM CHLORIDE CRYS ER 20 MEQ PO TBCR
40.0000 meq | EXTENDED_RELEASE_TABLET | Freq: Every day | ORAL | Status: DC
  Administered 2018-07-29 – 2018-08-02 (×5): 40 meq via ORAL
  Filled 2018-07-29 (×5): qty 2

## 2018-07-29 MED ORDER — OXYCODONE-ACETAMINOPHEN 5-325 MG PO TABS
1.0000 | ORAL_TABLET | Freq: Four times a day (QID) | ORAL | Status: DC | PRN
  Administered 2018-07-29 – 2018-08-01 (×11): 1 via ORAL
  Filled 2018-07-29 (×12): qty 1

## 2018-07-29 MED ORDER — MAGNESIUM SULFATE IN D5W 1-5 GM/100ML-% IV SOLN
1.0000 g | Freq: Once | INTRAVENOUS | Status: AC
  Administered 2018-07-29: 1 g via INTRAVENOUS
  Filled 2018-07-29: qty 100

## 2018-07-29 NOTE — NC FL2 (Signed)
[place] LEVEL OF CARE SCREENING TOOL     IDENTIFICATION  Patient Name: Nicholas Caldwell Birthdate: [birth year] Sex: male Admission Date (Current Location): 07/25/2018  [facility] [place] Number:  Herbalist and Address:  [facility] [place], [place] [address], [place], [place] [address]      Provider Number: [id number]  Attending Physician Name and Address:  Elmarie Shiley, MD  Relative Name and Phone Number:  Zohar Maroney; wife; [phone]    Current Level of Care: Hospital Recommended Level of Care: [place] Prior Approval Number:    Date Approved/Denied:   PASRR Number: [id number]  Discharge Plan: SNF    Current Diagnoses: Patient Active Problem List   Diagnosis Date Noted  . Acute respiratory failure with hypoxia and hypercapnia ([place]) 07/26/2018  . Acute exacerbation of CHF (congestive heart failure) ([place]) 07/26/2018  . CHF exacerbation ([place]) 05/01/2018  . CHF (congestive heart failure) ([place]) 04/30/2018  . Chronic respiratory failure with hypoxia ([place]) 12/28/2017  . Atrial fibrillation with RVR ([place])   . SVT (supraventricular tachycardia) ([place])   . Torsades de pointes ([place])   . COPD GOLD 0   . Medically noncompliant   . Panlobular emphysema ([place])   . OSA (obstructive sleep apnea)   . Pulmonary hypertension ([place])   . Disorientation   . Pressure injury of skin 09/07/2016  . Skin lesion-left heal 09/05/2016  . Chronic systolic CHF (congestive heart failure) ([place]) 07/16/2016  . COPD (chronic obstructive pulmonary disease) ([place]) 07/16/2016  . Uncontrolled type 2 diabetes mellitus with complication ([place])   . Diabetic polyneuropathy associated with diabetes mellitus due to underlying condition ([place])   . Normocytic anemia 06/29/2016  . CAD - Non-obstructive by LHC1/16 01/21/2016  . Nonischemic cardiomyopathy ([place]) 10/09/2015  . PAF (paroxysmal atrial fibrillation) ([place])   . Chronic pain 02/19/2015  .  Morbid obesity ([place]) 02/13/2015  . DOE (dyspnea on exertion)   . Elevated troponin I level 11/01/2014  . COPD exacerbation ([place]) 11/01/2014  . Essential hypertension 10/31/2014  . Type 2 diabetes mellitus with neuropathy 10/31/2014  . Hyperlipidemia  10/31/2014  . Cigarette smoker 10/31/2014    Orientation RESPIRATION BLADDER Height & Weight     Self, Time, Situation, Place  O2(2L nasal canula) Continent, External catheter Weight: 285 lb 4.4 oz (129.4 kg) Height:  _0  (188 cm)  BEHAVIORAL SYMPTOMS/MOOD NEUROLOGICAL BOWEL NUTRITION STATUS      Continent Diet(see discharge summary)  AMBULATORY STATUS COMMUNICATION OF NEEDS Skin   Extensive Assist Verbally Normal                       Personal Care Assistance Level of Assistance  Bathing, Feeding, Dressing Bathing Assistance: Maximum assistance Feeding assistance: Independent Dressing Assistance: Maximum assistance     Functional Limitations Info  Sight, Hearing, Speech Sight Info: Adequate Hearing Info: Adequate Speech Info: Adequate    SPECIAL CARE FACTORS FREQUENCY  PT (By licensed PT), OT (By licensed OT)     PT Frequency: 5x week OT Frequency: 5x week            Contractures Contractures Info: Not present    Additional Factors Info  Code Status, Allergies, Insulin Sliding Scale Code Status Info: Full Code Allergies Info: No Known Allergies   Insulin Sliding Scale Info: insulin aspart (novoLOG) injection 0-9 Units 3x daily with meals       Current Medications (07/29/2018):  This is the current hospital active medication  list Current Facility-Administered Medications  Medication Dose Route Frequency Provider Last Rate Last Dose  . 0.9 %  sodium chloride infusion  250 mL Intravenous PRN Hammonds, Sharyn Blitz, MD 10 mL/hr at 07/27/18 2159 250 mL at 07/27/18 2159  . albuterol (PROVENTIL) (2.5 MG/3ML) 0.083% nebulizer solution 2.5 mg  2.5 mg Nebulization Q2H PRN Rise Patience, MD   2.5 mg at  07/26/18 0447  . arformoterol (BROVANA) nebulizer solution 15 mcg  15 mcg Nebulization BID Hammonds, Sharyn Blitz, MD   15 mcg at 07/29/18 0717  . budesonide (PULMICORT) nebulizer solution 0.5 mg  0.5 mg Nebulization BID Hammonds, Sharyn Blitz, MD   0.5 mg at 07/29/18 0717  . carvedilol (COREG) tablet 6.25 mg  6.25 mg Oral BID WC Larey Dresser, MD   6.25 mg at 07/29/18 0905  . chlorhexidine (PERIDEX) 0.12 % solution 15 mL  15 mL Mouth Rinse BID Hammonds, Sharyn Blitz, MD   15 mL at 07/29/18 0926  . doxycycline (VIBRA-TABS) tablet 100 mg  100 mg Oral Q12H Regalado, Belkys A, MD   100 mg at 07/29/18 1328  . famotidine (PEPCID) tablet 20 mg  20 mg Oral Daily Regalado, Belkys A, MD   20 mg at 07/29/18 0906  . ferumoxytol (FERAHEME) 510 mg in sodium chloride 0.9 % 100 mL IVPB  510 mg Intravenous Once Larey Dresser, MD      . furosemide (LASIX) injection 80 mg  80 mg Intravenous BID Shirley Friar, PA-C   80 mg at 07/29/18 0370  . hydrALAZINE (APRESOLINE) injection 10 mg  10 mg Intravenous Q4H PRN Rise Patience, MD      . insulin aspart (novoLOG) injection 0-9 Units  0-9 Units Subcutaneous TID WC Regalado, Belkys A, MD   2 Units at 07/29/18 1322  . latanoprost (XALATAN) 0.005 % ophthalmic solution 1 drop  1 drop Both Eyes QHS Rise Patience, MD   1 drop at 07/28/18 2311  . lisinopril (PRINIVIL,ZESTRIL) tablet 20 mg  20 mg Oral Daily Larey Dresser, MD   20 mg at 07/29/18 0905  . magnesium oxide (MAG-OX) tablet 200 mg  200 mg Oral BID Regalado, Belkys A, MD   200 mg at 07/29/18 0905  . MEDLINE mouth rinse  15 mL Mouth Rinse q12n4p Hammonds, Sharyn Blitz, MD   15 mL at 07/29/18 1200  . oxyCODONE-acetaminophen (PERCOCET/ROXICET) 5-325 MG per tablet 1 tablet  1 tablet Oral Q6H PRN Regalado, Belkys A, MD   1 tablet at 07/29/18 1417  . potassium chloride SA (K-DUR,KLOR-CON) CR tablet 40 mEq  40 mEq Oral Daily Georgiana Shore, NP   40 mEq at 07/29/18 0905  . rivaroxaban (XARELTO) tablet 20  mg  20 mg Oral Daily Larey Dresser, MD   20 mg at 07/29/18 1321  . rosuvastatin (CRESTOR) tablet 20 mg  20 mg Oral q1800 Larey Dresser, MD   20 mg at 07/28/18 1803  . spironolactone (ALDACTONE) tablet 25 mg  25 mg Oral Daily Georgiana Shore, NP   25 mg at 07/29/18 4888     Discharge Medications: Please see discharge summary for a list of discharge medications.  Relevant Imaging Results:  Relevant Lab Results:   Additional Information SS# [place], [place]

## 2018-07-29 NOTE — Progress Notes (Signed)
PROGRESS NOTE    Nicholas Caldwell  NIO:[id number] DOB: 10-22-1946 DOA: 07/25/2018 PCP: [facility]    Brief Narrative: 84yoM with hx COPD, CHF (EF 25%), Afib (on xarelto), Tobacco absue, Chronic hypoxia (on 3L O2), HTN, DM, Chronic pain, and Noncompliance, presents to the ER c/o SOB and Increasing BLE edema x 4 days, found to be hypoxic on 84% in ER. Pox 98% on BIPAP. In the ER patient found to be hypoxic and CXR showed pulmonary edema. He was started on BIPAP 16/6 with ABG after on BIPAP showing acute-on-chronic hypercapnea (7.27/73/83/34). IPAP increased from 16 to 18, EPAP kept at 6. No lasix or nebs given yet. PCCM then consulted. When I arrived in ER patient is being prepared to leave room. RN says patient going to Head CT for AMS then admitted to [facility]. I halted the transfer, examined patient, then discussed with hospitalist. It seems earlier tonight he was more somnolent and only aroused to sternal rub. At time of my exam however, he arouses to voice and obeys commands, afocal neuro exam. Will hold off on Head CT for now. Will admit patient to ICU since he is on BIPAP and high risk of decompensating and requiring intubation. Discussed plan of care with patient's wife at bedside.     Assessment & Plan:   Principal Problem:   Acute respiratory failure with hypoxia and hypercapnia (HCC) Active Problems:   Type 2 diabetes mellitus with neuropathy   PAF (paroxysmal atrial fibrillation) (HCC)   Nonischemic cardiomyopathy (HCC)   CAD - Non-obstructive by GHW2/99   Chronic systolic CHF (congestive heart failure) (HCC)   COPD (chronic obstructive pulmonary disease) (HCC)   OSA (obstructive sleep apnea)   Pulmonary hypertension (HCC)   Acute exacerbation of CHF (congestive heart failure) (HCC)   1-Acute-on-Chronic Hypercapnic and Hypoxic Respiratory failure; Pulmonary edema; CHF exacerbation,Progressing  Pulmonary HTN: Likely OSA, needs CPAP. CM consulted.  Required BIPAP on  admission.  Of BIPAP.  CPAP at night.  On IV lasix. Heart failure team following.  Plan to continue with IV lasix.  He report symptoms as when he had PNA before. Chest x ray show low lobe infiltrates. Will start doxy, 5 day course.   Acute systolic HF exacerbation.  On lisinopril. Spironolactone.  IV lasix.  HF team following.  Negative 8 L.  Weight; 260---283--284--285  Chest x ray; stable vascular congestion, left infiltrate. Effusion.  Continue with lasix, weight up.   COPD;  Continue with nebulizer.   A fib; Continue with xarelto.   DM;  SSI.   AKI, hyperkalemia; resolved.   Chronic pain management. Percocet PR.   Anemia; monitor hb.  Iron 23, ferritin 16.  Will start iron supplement.   DVT prophylaxis: Xarelto.  Code Status: full code.  Family Communication: care discussed with patient, wife Disposition Plan: remian in the hospital for IV lasix.   Consultants:  Cardiology CCM   Procedures:   none  Antimicrobials:  none  Subjective: Report productive cough. Feels has pneumonia. Congested.  Report dyspnea.   Objective: Vitals:   07/28/18 1933 07/29/18 0441 07/29/18 0719 07/29/18 1331  BP: (!) 99/55 (!) 130/59  (!) 109/54  Pulse: 68 94  80  Resp: _0 Temp: 98.2 F (36.8 C) 98.6 F (37 C)  98 F (36.7 C)  TempSrc: Oral Oral  Oral  SpO2: 93% 94% 92% 90%  Weight:  129.4 kg    Height:        Intake/Output Summary (Last 24  hours) at 07/29/2018 1702 Last data filed at 07/29/2018 1600 Gross per 24 hour  Intake 1200 ml  Output 1550 ml  Net -350 ml   Filed Weights   07/27/18 0500 07/28/18 0457 07/29/18 0441  Weight: 128.5 kg 129.1 kg 129.4 kg    Examination:  General exam: NAD Respiratory system: Bilateral crackles, ronchus.  Cardiovascular system: S 1, S 2 RRR Gastrointestinal system: BS present, soft,. nt Central nervous system: non focal.  Extremities: symmetric power.  Skin: no rashes.  Psychiatry: mood and affect appropriate      Data Reviewed: I have personally reviewed following labs and imaging studies  CBC: Recent Labs  Lab 07/25/18 2159 07/26/18 0631 07/27/18 0223 07/28/18 0501 07/29/18 0556  WBC 8.1 5.7 7.6 7.5 8.0  NEUTROABS 6.1  --   --   --   --   HGB 9.5* 9.3* 8.6* 8.7* 8.9*  HCT 34.5* 32.7* 29.1* 29.3* 30.4*  MCV 90.1 87.9 83.4 84.7 85.4  PLT 371 366 377 392 014   Basic Metabolic Panel: Recent Labs  Lab 07/26/18 0059 07/26/18 0440 07/26/18 0631 07/26/18 1702 07/27/18 0223 07/28/18 0501 07/29/18 0556  NA 136 136 137 138 140 140 139  K 5.8* 5.5* 5.7* 5.1 4.2 3.4* 3.5  CL 95* 95* 96* 92* 95* 94* 94*  CO2 _0 35* 35* 35* 36*  GLUCOSE 119* 148* 147* 139* 114* 115* 180*  BUN _1 24* 26* 17  CREATININE 1.15 1.06 1.08 0.97 1.06 1.15 1.03  CALCIUM 8.5* 8.7* 8.8* 8.7* 8.8* 8.4* 8.3*  MG 2.3 2.1 2.1  --   --  1.7 2.0  PHOS  --   --  4.7*  --   --   --   --    GFR: Estimated Creatinine Clearance: 95.4 mL/min (by C-G formula based on SCr of 1.03 mg/dL). Liver Function Tests: Recent Labs  Lab 07/26/18 0631  AST 15  ALT 9  ALKPHOS 95  BILITOT 0.5  PROT 7.1  ALBUMIN 3.0*   No results for input(s): LIPASE, AMYLASE in the last 168 hours. Recent Labs  Lab 07/26/18 0440  AMMONIA 42*   Coagulation Profile: Recent Labs  Lab 07/26/18 0440  INR 1.56   Cardiac Enzymes: Recent Labs  Lab 07/26/18 0059 07/26/18 0631 07/26/18 1157  TROPONINI 0.04* 0.04* <0.03   BNP (last 3 results) No results for input(s): PROBNP in the last 8760 hours. HbA1C: No results for input(s): HGBA1C in the last 72 hours. CBG: Recent Labs  Lab 07/28/18 1644 07/28/18 2102 07/29/18 0735 07/29/18 1221 07/29/18 1622  GLUCAP 168* 207* 148* 180* 191*   Lipid Profile: No results for input(s): CHOL, HDL, LDLCALC, TRIG, CHOLHDL, LDLDIRECT in the last 72 hours. Thyroid Function Tests: No results for input(s): TSH, T4TOTAL, FREET4, T3FREE, THYROIDAB in the last 72 hours. Anemia  Panel: Recent Labs    07/29/18 0556  VITAMINB12 235  FOLATE 7.4  FERRITIN 16*  TIBC 368  IRON 23*  RETICCTPCT 2.3   Sepsis Labs: No results for input(s): PROCALCITON, LATICACIDVEN in the last 168 hours.  Recent Results (from the past 240 hour(s))  MRSA PCR Screening     Status: None   Collection Time: 07/26/18  4:04 AM  Result Value Ref Range Status   MRSA by PCR NEGATIVE NEGATIVE Final    Comment:        The GeneXpert MRSA Assay (FDA approved for NASAL specimens only), is one component of a comprehensive MRSA colonization surveillance program.  It is not intended to diagnose MRSA infection nor to guide or monitor treatment for MRSA infections. Performed at [facility], [place] [address] [place], [place] [address]          Radiology Studies: Dg Chest Port 1 View  Result Date: 07/28/2018 CLINICAL DATA:  Respiratory failure EXAM: PORTABLE CHEST 1 VIEW COMPARISON:  07/27/2018 FINDINGS: Cardiac shadow remains enlarged. Aortic calcifications are again seen. Vascular congestion is again noted. Left basilar infiltrate with associated effusion is again seen. No bony abnormality is noted. IMPRESSION: Stable vascular congestion. Stable left basilar infiltrate with associated effusion. Electronically Signed   By: Inez Catalina M.D.   On: 07/28/2018 11:09        Scheduled Meds: . arformoterol  15 mcg Nebulization BID  . budesonide (PULMICORT) nebulizer solution  0.5 mg Nebulization BID  . carvedilol  6.25 mg Oral BID WC  . chlorhexidine  15 mL Mouth Rinse BID  . doxycycline  100 mg Oral Q12H  . famotidine  20 mg Oral Daily  . furosemide  80 mg Intravenous BID  . insulin aspart  0-9 Units Subcutaneous TID WC  . latanoprost  1 drop Both Eyes QHS  . lisinopril  20 mg Oral Daily  . magnesium oxide  200 mg Oral BID  . mouth rinse  15 mL Mouth Rinse q12n4p  . potassium chloride  40 mEq Oral Daily  . rivaroxaban  20 mg Oral Daily  . rosuvastatin  20 mg Oral q1800  .  spironolactone  25 mg Oral Daily   Continuous Infusions: . sodium chloride 250 mL (07/27/18 2159)  . ferumoxytol       LOS: 3 days    Time spent: 35 minutes.     Elmarie Shiley, MD [facility] Pager [phone]  If 7PM-7AM, please contact night-coverage [email or web address] Password [email or web address] 07/29/2018, 5:02 PM

## 2018-07-29 NOTE — Progress Notes (Addendum)
Advanced Heart Failure Rounding Note  PCP-Cardiologist: No primary care provider on file.   Subjective:    Creatinine 1.03, K 3.5, mag 2.0. I/O's negative 1.4 L yesterday. Weight up 1 lb. Wore 2 L O2 overnight.  Hgb 9.5 -> 9.3 -> 8.6 -> 8.7 -> 8.9. No overt bleeding.  HF meds titrated yesterday. SBP 99-130s. PT recommending SNF.  Seems to be in aflutter this am. Will check EKG.  Drowsy today. Denies SOB, CP, or dizziness. Slept sitting up.   Objective:   Weight Range: 129.4 kg Body mass index is 36.63 kg/m.   Vital Signs:   Temp:  [98.2 F (36.8 C)-98.6 F (37 C)] 98.6 F (37 C) (09/26 0441) Pulse Rate:  [68-94] 94 (09/26 0441) Resp:  [16-18] 18 (09/26 0441) BP: (99-130)/(55-67) 130/59 (09/26 0441) SpO2:  [92 %-96 %] 92 % (09/26 0719) Weight:  [129.4 kg] 129.4 kg (09/26 0441) Last BM Date: 07/27/18  Weight change: Filed Weights   07/27/18 0500 07/28/18 0457 07/29/18 0441  Weight: 128.5 kg 129.1 kg 129.4 kg    Intake/Output:   Intake/Output Summary (Last 24 hours) at 07/29/2018 0738 Last data filed at 07/29/2018 2446 Gross per 24 hour  Intake 1110 ml  Output 2500 ml  Net -1390 ml      Physical Exam    General:No resp difficulty. HEENT: Normal Neck: Supple. JVP ~10. Carotids 2+ bilat; no bruits. No thyromegaly or nodule noted. Cor: PMI nondisplaced. RRR, No M/G/R noted. Large bony mass vs very large xyphoid process Lungs: wheezing throughout. No cough. On 2 L [place] Abdomen: Soft, non-tender, non-distended, no HSM. No bruits or masses. +BS  Extremities: No cyanosis, clubbing, or rash. R and LLE 1+ edema to knees. Neuro: drowsy & orientedx3, cranial nerves grossly intact. moves all 4 extremities w/o difficulty. Affect pleasant  Telemetry   Appears to be in Aflutter 70-80s. Personally reviewed.   EKG    No new tracings.    Labs    CBC Recent Labs    07/28/18 0501 07/29/18 0556  WBC 7.5 8.0  HGB 8.7* 8.9*  HCT 29.3* 30.4*  MCV 84.7 85.4    PLT 392 PENDING   Basic Metabolic Panel Recent Labs    07/28/18 0501 07/29/18 0556  NA 140 139  K 3.4* 3.5  CL 94* 94*  CO2 35* 36*  GLUCOSE 115* 180*  BUN 26* 17  CREATININE 1.15 1.03  CALCIUM 8.4* 8.3*  MG 1.7 2.0   Liver Function Tests No results for input(s): AST, ALT, ALKPHOS, BILITOT, PROT, ALBUMIN in the last 72 hours. No results for input(s): LIPASE, AMYLASE in the last 72 hours. Cardiac Enzymes Recent Labs    07/26/18 1157  TROPONINI <0.03    BNP: BNP (last 3 results) Recent Labs    04/30/18 2049 07/25/18 2159 07/28/18 0501  BNP 1,912.5* 2,064.1* 1,381.4*    ProBNP (last 3 results) No results for input(s): PROBNP in the last 8760 hours.   D-Dimer No results for input(s): DDIMER in the last 72 hours. Hemoglobin A1C No results for input(s): HGBA1C in the last 72 hours. Fasting Lipid Panel No results for input(s): CHOL, HDL, LDLCALC, TRIG, CHOLHDL, LDLDIRECT in the last 72 hours. Thyroid Function Tests No results for input(s): TSH, T4TOTAL, T3FREE, THYROIDAB in the last 72 hours.  Invalid input(s): FREET3  Other results:   Imaging    No results found.   Medications:     Scheduled Medications: . arformoterol  15 mcg Nebulization BID  .  budesonide (PULMICORT) nebulizer solution  0.5 mg Nebulization BID  . carvedilol  6.25 mg Oral BID WC  . chlorhexidine  15 mL Mouth Rinse BID  . famotidine  20 mg Oral Daily  . furosemide  80 mg Intravenous BID  . insulin aspart  0-9 Units Subcutaneous TID WC  . latanoprost  1 drop Both Eyes QHS  . lisinopril  20 mg Oral Daily  . magnesium oxide  200 mg Oral BID  . mouth rinse  15 mL Mouth Rinse q12n4p  . rivaroxaban  20 mg Oral Daily  . rosuvastatin  20 mg Oral q1800  . spironolactone  25 mg Oral Daily    Infusions: . sodium chloride 250 mL (07/27/18 2159)    PRN Medications: sodium chloride, albuterol, hydrALAZINE, oxyCODONE-acetaminophen    Patient Profile   Nicholas Caldwell is a 72 y.o.  male e with PMH of chronic systolic CHF/nonischemic cardiomyopathy Echo 12/13/2016 LVEF 40-45%, COPD on home oxygen, PAF and history of non compliance.   Admitted overnight into 07/26/18 with acute hypoxic hypercarbic resp failure and A/C CHF.  Assessment/Plan   1. Acute on chronic hypoxic hypercarbic resp failure - Improved. On 2 L [place] this am (wears 3 L at home) - Per wife he was scheduled for sleep study and never went. He has had suspected sleep apnea for years, and has always had positive response to CPAP while in hospital. CM working on getting him CPAP outpatient. 2. Acute on chronic systolic CHF  - Echo 03/04/76 40-45%, Grade 2 DD, Mod LAE, Mild RV, Mod RAE, PA peak pressure 56 mm Hg. - Echo 04/07/18: EF 40-45%, PA peak pressure 62 mmHg - Volume status remains elevated. - Continue IV lasix 80 mg BID, at least for this morning.  - Continue spiro 25 mg daily - Continue lisinopril 20 mg daily - Continue coreg 6.25 mg BID. 3. Paroxysmal Afib -Appears to be in aflutter on tele. Check EKG. May need to do DCCV. - Back on xarelto. Continue. Hemoglobin 8.9 - CHA2DS2/VASc is 5.  - Has previously failed amioadrone and poor tikosyn candidate with long QT. Now following with EP for possible long QT syndrome.  - Poor ablation candidate with LA siz. 4. COPD - Previous Chest CT with Emphysema. - Dr. Opal Sidles as outpatient.  - No active wheeze on exam - On chronic O2. On 2 L [place] this am.  5. Tobacco abuse  - Encouraged complete cessation. No change. 6.Hip arthritis - Pt is hoping to schedule hip arthroplasty once cleared by [place] and his PCP.  - Echo had been repeated, with results as above.May need to repeat with decompensation.  -Surgical risk is moderate with 6.6% chance of MI, pulmonary edema, VF, cardiac arrest, or complete heart block according to [facility] Criteria. - PT recommending SNF. 7. Mild nonobstructive CAD on LHC 2016 - Continue crestor 20 mg daily and xarelto. No CP. 8.  Hyperkalemia > hypokalemia / hypomagnesiumia - K 3.5, mag 2.0. Start 40 meq daily K. 9. ?OSA - CM working on getting him set up for CPAP   Medication concerns reviewed with patient and pharmacy team. Barriers identified: Non compliance  Addendum: EKG shows aflutter. Will give him 1 gram mag per Dr [name]. I will set up for DCCV tomorrow.  Length of Stay: [place], NP  07/29/2018, 7:38 AM  Advanced Heart Failure Team Pager [phone] (M-F; 7a - 4p)  Please contact [facility] for night-coverage after hours (4p -7a ) and weekends on  [email or web address]  Patient seen with NP, agree with the above note.   He diuresed well again yesterday, used CPAP and awake/alert today. This morning, he went into rate-controlled atrial flutter.   On exam, JVP 10-12 cm, clear lungs, irreg S1S2, 1+ edema at ankles  1. Acute on chronic hypercarbic respiratory failure: Suspect OHS/OSA. Now improved, awake/alert on nasal cannula.  - Will need sleep study as outpatient. Would be good if we can find a way to get him CPAP at home.  2. COPD: Emphysema on CT chest. On home oxygen. Needs to quit smoking.  3. Acute on chronic systolic CHF: Nonischemic cardiomyopathy, nonobstructive CAD on prior cath. Last echo in 6/19 with EF 40-45%, PASP 62 mmHg. On exam, he remains volume overloaded today. He diuresed well again yesterday with IV Lasix and creatinine is stable.  - Continue Lasix 80 mg IV bid today.  - Continue current lisinopril and spironolactone.  - Increase Coreg to 9.375 mg bid.    4. Suspected long QT syndrome: Refused ICD in past, followed by Dr. Caryl Comes.  5. Hip arthritis: Limiting, awaiting eventual THR when cleared.  6. Atrial fibrillation/flutter: Paroxysmal.Options for treatment are limited with large LA, long QT (no Tikosyn or amiodarone), and nonobstructive CAD.  Today, he went into rate-controlled atrial flutter.   - Continue Xarelto, says he has not missed doses of this (had run out of  some of his other meds). - Continue Coreg.  - If he does not come out of flutter overnight, will plan DCCV tomorrow. As above, not a good candidate for antiarrhythmics. I discussed risks/benefits of DCCV with patient and he agrees to procedure if needed.  7. Anemia: Fe deficient, give feraheme.   Loralie Champagne 07/29/2018 4:08 PM

## 2018-07-29 NOTE — Anesthesia Preprocedure Evaluation (Addendum)
Anesthesia Evaluation  Patient identified by MRN, date of birth, ID band Patient awake    Reviewed: Allergy & Precautions, NPO status , Patient's Chart, lab work & pertinent test results  Airway Mallampati: II  TM Distance: >3 FB Neck ROM: Full    Dental no notable dental hx. (+) Edentulous Upper, Edentulous Lower   Pulmonary sleep apnea , COPD, Current Smoker,    Pulmonary exam normal breath sounds clear to auscultation       Cardiovascular Exercise Tolerance: Good hypertension, Pt. on medications and Pt. on home beta blockers + CAD, +CHF and + DOE  negative cardio ROS Normal cardiovascular exam Rhythm:Regular Rate:Normal  Echo 04/07/2018 Left ventricle: Diffuse hypokinesis worse in the inferior wall   with abnormal septal motion The cavity size was mildly dilated.   Wall thickness was normal. Systolic function was mildly to   moderately reduced. The estimated ejection fraction was in the   range of 40% to 45%. Left ventricular diastolic function   parameters were normal.   Neuro/Psych  Neuromuscular disease negative psych ROS   GI/Hepatic negative GI ROS, GERD  ,  Endo/Other  diabetes, Type 2  Renal/GU      Musculoskeletal   Abdominal (+) + obese,   Peds  Hematology  (+) anemia ,   Anesthesia Other Findings   Reproductive/Obstetrics                            Anesthesia Physical Anesthesia Plan  ASA: III  Anesthesia Plan: General   Post-op Pain Management:    Induction:   PONV Risk Score and Plan: Treatment may vary due to age or medical condition  Airway Management Planned: Natural Airway and Nasal Cannula  Additional Equipment:   Intra-op Plan:   Post-operative Plan:   Informed Consent: I have reviewed the patients History and Physical, chart, labs and discussed the procedure including the risks, benefits and alternatives for the proposed anesthesia with the patient or  authorized representative who has indicated his/her understanding and acceptance.   Dental advisory given  Plan Discussed with:   Anesthesia Plan Comments:         Anesthesia Quick Evaluation

## 2018-07-29 NOTE — Progress Notes (Signed)
Physical Therapy Treatment Patient Details Name: Nicholas Caldwell MRN: [id number] DOB: 1946-06-17 Today's Date: 07/29/2018    History of Present Illness 72 y.o. male Admitted overnight into 07/26/18 with acute hypoxic hypercarbic resp failure and A/C CHF. PMH include: COPD, CHF, CAD, a flutter, PAF, pulmonary HTN, HTN,     PT Comments    Unable to fully assess this patient again this visit, now due to irritation/disposition. Pt stating "nobody here cares about me, they only care about making money and keeping the hospital open". Pt also states he is upset that I told his nurse last night he was drowsy. Educated I informed her for his safety and questions we were asking were to ensure he was not having adverse reaction to medication. Of note: he does not recall telling me it was 1958, or asking me if his wife had let me into his home on 4 separate occasions over the course of 20+ minutes and disputes he said that. Unable to motivate this patient to participate in therapy, however he is agreeable for PT to see him tomorrow, I will recommend another clinician in hopes for a better rapport. Pt with poor safety awareness and slight impulsivity this session, denying use of gait belt and wishing to sit on bedside commode as a recliner. PT, MD, RN all encouraged patient not to use the [facility] as a chair for safety but he remained adamant.   When asked if he wants to go home vs SNF patient states home. I however have not seen this patient ambulate and can not attest to his safety at this time, as he has also indicated to me he feels weaker and having issues walking with his R hip pain. He was stand by assist for transfer to [facility] today.     Follow Up Recommendations  SNF(may progress to HHPT)     Equipment Recommendations  (TBD)    Recommendations for Other Services       Precautions / Restrictions Restrictions Weight Bearing Restrictions: No    Mobility  Bed Mobility Overal bed mobility: Needs  Assistance Bed Mobility: Supine to Sit     Supine to sit: Modified independent (Device/Increase time)        Transfers Overall transfer level: Needs assistance Equipment used: None             General transfer comment: can stand without assistance with increased time and effort and instability. quick stand pivot to chair. denying ambulation  Ambulation/Gait                 Stairs             Wheelchair Mobility    Modified Rankin (Stroke Patients Only)       Balance                                            Cognition Arousal/Alertness: Awake/alert                                     General Comments: Pt more alert and oriented      Exercises      General Comments        Pertinent Vitals/Pain Pain Assessment: Faces Faces Pain Scale: Hurts little more Pain Location: R hip and stomach    Home  Living                      Prior Function            PT Goals (current goals can now be found in the care plan section) Acute Rehab PT Goals Patient Stated Goal: sit on [facility] PT Goal Formulation: With patient Time For Goal Achievement: 08/04/18 Potential to Achieve Goals: Good Progress towards PT goals: Progressing toward goals    Frequency    Min 3X/week      PT Plan Current plan remains appropriate    Co-evaluation              AM-PAC PT "6 Clicks" Daily Activity  Outcome Measure  Difficulty turning over in bed (including adjusting bedclothes, sheets and blankets)?: Unable Difficulty moving from lying on back to sitting on the side of the bed? : Unable Difficulty sitting down on and standing up from a chair with arms (e.g., wheelchair, bedside commode, etc,.)?: Unable Help needed moving to and from a bed to chair (including a wheelchair)?: Total Help needed walking in hospital room?: Total Help needed climbing 3-5 steps with a railing? : Total 6 Click Score: 6    End of  Session Equipment Utilized During Treatment: Gait belt Activity Tolerance: Patient tolerated treatment well Patient left: in bed Nurse Communication: Mobility status PT Visit Diagnosis: Unsteadiness on feet (R26.81);Pain Pain - Right/Left: Right Pain - part of body: Hip     Time: 1205-1230 PT Time Calculation (min) (ACUTE ONLY): 25 min  Charges:  $Therapeutic Activity: 8-22 mins                     Reinaldo Berber, PT, DPT Acute Rehabilitation Services Pager: [phone] Office: [phone]     Reinaldo Berber 07/29/2018, 1:08 PM

## 2018-07-29 NOTE — Social Work (Signed)
CSW acknowledging consult for SNF placement, however pt verbally refusing placement. Will meet with pt prior to sending referral out via hub.  Alexander Mt, Portal Work [phone]

## 2018-07-30 ENCOUNTER — Inpatient Hospital Stay (HOSPITAL_COMMUNITY): Payer: Medicare Other | Admitting: Anesthesiology

## 2018-07-30 ENCOUNTER — Encounter (HOSPITAL_COMMUNITY): Payer: Self-pay | Admitting: Certified Registered"

## 2018-07-30 ENCOUNTER — Encounter (HOSPITAL_COMMUNITY)
Admission: EM | Disposition: A | Discharge status: Home / Self Care | Payer: Self-pay | Source: Home / Self Care | Attending: Internal Medicine

## 2018-07-30 DIAGNOSIS — I4892 Unspecified atrial flutter: Secondary | ICD-10-CM

## 2018-07-30 HISTORY — PX: CARDIOVERSION: SHX1299

## 2018-07-30 LAB — BASIC METABOLIC PANEL
Anion gap: 11 (ref 5–15)
BUN: 18 mg/dL (ref 8–23)
CHLORIDE: 93 mmol/L — AB (ref 98–111)
CO2: 34 mmol/L — AB (ref 22–32)
Calcium: 8.4 mg/dL — ABNORMAL LOW (ref 8.9–10.3)
Creatinine, Ser: 1.05 mg/dL (ref 0.61–1.24)
GFR calc non Af Amer: >60 mL/min (ref >60)
Glucose, Bld: 154 mg/dL — ABNORMAL HIGH (ref 70–99)
Potassium: 3.7 mmol/L (ref 3.5–5.1)
SODIUM: 138 mmol/L (ref 135–145)

## 2018-07-30 LAB — GLUCOSE, CAPILLARY
GLUCOSE-CAPILLARY: 156 mg/dL — AB (ref 70–99)
GLUCOSE-CAPILLARY: 163 mg/dL — AB (ref 70–99)
GLUCOSE-CAPILLARY: 161 mg/dL — AB (ref 70–99)
Glucose-Capillary: 146 mg/dL — ABNORMAL HIGH (ref 70–99)
Glucose-Capillary: 131 mg/dL — ABNORMAL HIGH (ref 70–99)

## 2018-07-30 LAB — CBC
HEMATOCRIT: 31 % — AB (ref 39.0–52.0)
HEMOGLOBIN: 9 g/dL — AB (ref 13.0–17.0)
MCH: 24.9 pg — ABNORMAL LOW (ref 26.0–34.0)
MCHC: 29 g/dL — ABNORMAL LOW (ref 30.0–36.0)
MCV: 85.6 fL (ref 78.0–100.0)
Platelets: 343 10*3/uL (ref 150–400)
RBC: 3.62 MIL/uL — AB (ref 4.22–5.81)
RDW: 18.4 % — ABNORMAL HIGH (ref 11.5–15.5)
WBC: 8.4 10*3/uL (ref 4.0–10.5)

## 2018-07-30 SURGERY — CARDIOVERSION
Anesthesia: General

## 2018-07-30 MED ORDER — LIDOCAINE 2% (20 MG/ML) 5 ML SYRINGE
INTRAMUSCULAR | Status: DC | PRN
  Administered 2018-07-30: 100 mg via INTRAVENOUS

## 2018-07-30 MED ORDER — POTASSIUM CHLORIDE CRYS ER 20 MEQ PO TBCR
40.0000 meq | EXTENDED_RELEASE_TABLET | Freq: Once | ORAL | Status: AC
  Administered 2018-07-30: 40 meq via ORAL
  Filled 2018-07-30: qty 2

## 2018-07-30 MED ORDER — METOLAZONE 2.5 MG PO TABS
2.5000 mg | ORAL_TABLET | Freq: Once | ORAL | Status: AC
  Administered 2018-07-30: 2.5 mg via ORAL
  Filled 2018-07-30: qty 1

## 2018-07-30 MED ORDER — FERROUS SULFATE 325 (65 FE) MG PO TABS
325.0000 mg | ORAL_TABLET | Freq: Two times a day (BID) | ORAL | Status: DC
  Administered 2018-07-31 – 2018-08-02 (×5): 325 mg via ORAL
  Filled 2018-07-30 (×5): qty 1

## 2018-07-30 MED ORDER — PROPOFOL 10 MG/ML IV BOLUS
INTRAVENOUS | Status: DC | PRN
  Administered 2018-07-30: 90 mg via INTRAVENOUS

## 2018-07-30 NOTE — Anesthesia Procedure Notes (Signed)
Procedure Name: General with mask airway Date/Time: 07/30/2018 8:06 AM Performed by: Imagene Riches, CRNA Pre-anesthesia Checklist: Patient identified, Emergency Drugs available, Suction available and Patient being monitored Patient Re-evaluated:Patient Re-evaluated prior to induction Oxygen Delivery Method: Ambu bag Preoxygenation: Pre-oxygenation with 100% oxygen

## 2018-07-30 NOTE — Procedures (Signed)
Electrical Cardioversion Procedure Note Nicholas Caldwell [id number] 31-May-1946  Procedure: Electrical Cardioversion Indications:  Atrial Flutter  Procedure Details Consent: Risks of procedure as well as the alternatives and risks of each were explained to the (patient/caregiver).  Consent for procedure obtained. Time Out: Verified patient identification, verified procedure, site/side was marked, verified correct patient position, special equipment/implants available, medications/allergies/relevent history reviewed, required imaging and test results available.  Performed  Patient placed on cardiac monitor, pulse oximetry, supplemental oxygen as necessary.  Sedation given: Propofol per anesthesiology Pacer pads placed anterior and posterior chest.  Cardioverted 1 time(s).  Cardioverted at 150J.  Evaluation Findings: Post procedure EKG shows: NSR Complications: None Patient did tolerate procedure well.   Loralie Champagne 07/30/2018, 8:05 AM

## 2018-07-30 NOTE — Transfer of Care (Signed)
Immediate Anesthesia Transfer of Care Note  Patient: Nicholas Caldwell  Procedure(s) Performed: CARDIOVERSION (N/A )  Patient Location: Endoscopy Unit  Anesthesia Type:General  Level of Consciousness: drowsy  Airway & Oxygen Therapy: Patient Spontanous Breathing and Patient connected to nasal cannula oxygen  Post-op Assessment: Report given to RN and Post -op Vital signs reviewed and stable  Post vital signs: Reviewed and stable  Last Vitals:  Vitals Value Taken Time  BP    Temp    Pulse    Resp    SpO2      Last Pain:  Vitals:   07/30/18 0719  TempSrc: Oral  PainSc:       Patients Stated Pain Goal: 2 (14/48/18 5631)  Complications: No apparent anesthesia complications

## 2018-07-30 NOTE — Progress Notes (Signed)
Physical Therapy Treatment Patient Details Name: Nicholas Caldwell MRN: [id number] DOB: [birth year] Today's Date: 07/30/2018    History of Present Illness 72 y.o. male Admitted overnight into 07/26/18 with acute hypoxic hypercarbic resp failure and A/C CHF. PMH include: COPD, CHF, CAD, a flutter, PAF, pulmonary HTN, HTN,     PT Comments    Pt EOB on arrival and wanting to get to the toilet. Pt agreeable to short ambulation with limited activity due to hip pain. Pt walked with RW to bathroom and upon exiting bathroom no longer able to recall agreed upon gait trial. Pt walked from toilet to sink then sat in chair prior to locking it despite cues to wait and need for brakes. Pt then became agitated with offers to walk with chair follow or progress activity. Pt given option of walking or performing HEP in chair and he refused both stating it is a "bad day". Pt then proceeded to state we are all here for the money and don't care about him. Despite reassurance and attempting to let pt state goal, plan and desire for session it was a cyclical conversation. Pt would not state he does not want to see therapy or that he is agreeing to try. Explained to pt that we were ordered by MD with goal of having him return home. Pt states he prefers afternoon and only want to move on "good days". Offered the option of checking in for "good vs bad day status" prior to attempting any further sessions. Pt demonstrates deficits with mobility, memory and problem solving but currently does not exhibit desire to work with or be educated by therapists on this "bad day".    Follow Up Recommendations  SNF     Equipment Recommendations  None recommended by PT    Recommendations for Other Services       Precautions / Restrictions Precautions Precautions: Fall    Mobility  Bed Mobility               General bed mobility comments: EOB on arrival  Transfers Overall transfer level: Modified independent               General transfer comment: pt able to stand from bed and from toilet without assist  Ambulation/Gait Ambulation/Gait assistance: Supervision Gait Distance (Feet): 15 Feet Assistive device: Rolling walker (2 wheeled) Gait Pattern/deviations: Step-through pattern;Decreased stride length;Trunk flexed   Gait velocity interpretation: <1.31 ft/sec, indicative of household ambulator General Gait Details: slow gait, flexed over walker from bed to bath. Pt then stood from toilet and walked to sink and sat in chair at sink with refusal to walk anymore even with chair follow   Stairs             Wheelchair Mobility    Modified Rankin (Stroke Patients Only)       Balance Overall balance assessment: Mild deficits observed, not formally tested                                          Cognition Arousal/Alertness: Awake/alert Behavior During Therapy: Flat affect Overall Cognitive Status: Impaired/Different from baseline Area of Impairment: Memory;Following commands;Attention;Problem solving                   Current Attention Level: Focused Memory: Decreased short-term memory Following Commands: Follows one step commands inconsistently     Problem Solving: Slow processing General Comments:  pt initially agreeable to walking short distance, walked to bathroom, and upon exiting bathroom could not recall agreeing to walk      Exercises      General Comments        Pertinent Vitals/Pain Pain Assessment: 0-10 Pain Score: 9  Pain Location: 9 right hip Pain Descriptors / Indicators: Aching Pain Intervention(s): Limited activity within patient's tolerance;Repositioned;Monitored during session    Home Living                      Prior Function            PT Goals (current goals can now be found in the care plan section) Progress towards PT goals: Not progressing toward goals - comment(minimal willing to participate)    Frequency     Min 2X/week      PT Plan Current plan remains appropriate    Co-evaluation              AM-PAC PT "6 Clicks" Daily Activity  Outcome Measure  Difficulty turning over in bed (including adjusting bedclothes, sheets and blankets)?: A Little Difficulty moving from lying on back to sitting on the side of the bed? : A Little Difficulty sitting down on and standing up from a chair with arms (e.g., wheelchair, bedside commode, etc,.)?: A Little Help needed moving to and from a bed to chair (including a wheelchair)?: A Little Help needed walking in hospital room?: A Little Help needed climbing 3-5 steps with a railing? : A Lot 6 Click Score: 17    End of Session   Activity Tolerance: Patient limited by pain Patient left: in chair;with call bell/phone within reach;with family/visitor present Nurse Communication: Mobility status PT Visit Diagnosis: Muscle weakness (generalized) (M62.81);Other abnormalities of gait and mobility (R26.89) Pain - Right/Left: Right Pain - part of body: Hip     Time: 8372-9021 PT Time Calculation (min) (ACUTE ONLY): 47 min  Charges:  $Gait Training: 8-22 mins $Therapeutic Activity: 8-22 mins                     Wallace, PT Acute Rehabilitation Services Pager: [phone] Office: [phone]    Lemonte Al B Malikai Gut 07/30/2018, 12:43 PM

## 2018-07-30 NOTE — Plan of Care (Signed)
  Problem: Clinical Measurements: Goal: Respiratory complications will improve Outcome: Progressing   Problem: Clinical Measurements: Goal: Cardiovascular complication will be avoided Outcome: Progressing

## 2018-07-30 NOTE — Progress Notes (Signed)
Pt refusing CPAP tonight.

## 2018-07-30 NOTE — Progress Notes (Signed)
PT Cancellation Note  Patient Details Name: Nicholas Caldwell MRN: [id number] DOB: [birth year]   Cancelled Treatment:    Reason Eval/Treat Not Completed: Patient at procedure or test/unavailable   Thimothy Barretta B Delani Kohli 07/30/2018, 7:22 AM  Elwyn Reach, PT Acute Rehabilitation Services Pager: [phone] Office: [phone]

## 2018-07-30 NOTE — Care Management Important Message (Signed)
Important Message  Patient Details  Name: Nicholas Caldwell MRN: [id number] Date of Birth: 07-15-1946   Medicare Important Message Given:  Yes    Jobanny Mavis P Enrique Manganaro 07/30/2018, 4:12 PM

## 2018-07-30 NOTE — Progress Notes (Signed)
PROGRESS NOTE    Nicholas Caldwell  GNO:[id number] DOB: 1946/05/19 DOA: 07/25/2018 PCP: [facility]    Brief Narrative: 11yoM with hx COPD, CHF (EF 25%), Afib (on xarelto), Tobacco absue, Chronic hypoxia (on 3L O2), HTN, DM, Chronic pain, and Noncompliance, presents to the ER c/o SOB and Increasing BLE edema x 4 days, found to be hypoxic on 84% in ER. Pox 98% on BIPAP. In the ER patient found to be hypoxic and CXR showed pulmonary edema. He was started on BIPAP 16/6 with ABG after on BIPAP showing acute-on-chronic hypercapnea (7.27/73/83/34). IPAP increased from 16 to 18, EPAP kept at 6. No lasix or nebs given yet. PCCM then consulted. When I arrived in ER patient is being prepared to leave room. RN says patient going to Head CT for AMS then admitted to [facility]. I halted the transfer, examined patient, then discussed with hospitalist. It seems earlier tonight he was more somnolent and only aroused to sternal rub. At time of my exam however, he arouses to voice and obeys commands, afocal neuro exam. Will hold off on Head CT for now. Will admit patient to ICU since he is on BIPAP and high risk of decompensating and requiring intubation. Discussed plan of care with patient's wife at bedside.     Assessment & Plan:   Principal Problem:   Acute respiratory failure with hypoxia and hypercapnia (HCC) Active Problems:   Type 2 diabetes mellitus with neuropathy   PAF (paroxysmal atrial fibrillation) (HCC)   Nonischemic cardiomyopathy (HCC)   CAD - Non-obstructive by VQX4/50   Chronic systolic CHF (congestive heart failure) (HCC)   COPD (chronic obstructive pulmonary disease) (HCC)   OSA (obstructive sleep apnea)   Pulmonary hypertension (HCC)   Acute exacerbation of CHF (congestive heart failure) (HCC)   1-Acute-on-Chronic Hypercapnic and Hypoxic Respiratory failure; Pulmonary edema; CHF exacerbation,Progressing  Pulmonary HTN: Likely OSA, needs CPAP. CM consulted.  Required BIPAP on  admission.  Off BIPAP.  CPAP at night.  On IV lasix. Heart failure team following.  Doxy to cover for PNA>  Acute systolic HF exacerbation.  On lisinopril. Spironolactone.  IV lasix.  HF team following.  Negative 9 L.  Weight; 260---283--284--285 --278.  Weight on last hospitalization day was at 272 Chest x ray; stable vascular congestion, left infiltrate. Effusion. Covering for PNA with doxy Continue with lasix, weight up.   COPD;  Continue with nebulizer.   A fib; Continue with xarelto.  Underwent cardioversion today.   DM;  SSI.   AKI, hyperkalemia; resolved.   Chronic pain management. Percocet PR.   Anemia; monitor hb.  Iron 23, ferritin 16.  Continue  iron supplement.   DVT prophylaxis: Xarelto.  Code Status: full code.  Family Communication: care discussed with patient, wife Disposition Plan: continue with management of HF, IV lasix. Underwent cardioversion today   Consultants:  Cardiology CCM   Procedures:   none  Antimicrobials:  none  Subjective: Report mild dyspnea.  Cough improved./   Objective: Vitals:   07/30/18 0500 07/30/18 0719 07/30/18 0807 07/30/18 0814  BP: 134/70 (!) 143/67 (!) 118/51 (!) 107/49  Pulse: 82 82  72  Resp: 20 (!) 26 (!) 30 (!) 34  Temp: 99 F (37.2 C)  99.2 F (37.3 C)   TempSrc: Oral Oral Oral   SpO2: 96% 94% 92% 92%  Weight:      Height:        Intake/Output Summary (Last 24 hours) at 07/30/2018 1427 Last data filed at 07/30/2018 1300  Gross per 24 hour  Intake 1110 ml  Output 2750 ml  Net -1640 ml   Filed Weights   07/28/18 0457 07/29/18 0441 07/30/18 0313  Weight: 129.1 kg 129.4 kg 126.2 kg    Examination:  General exam: NAD Respiratory system: normal respiratory effort, bilateral crackles.  Cardiovascular system: S 1,S 2 RRR Gastrointestinal system: BS present, soft, nt Central nervous system: non focal  Extremities: Symmetric power.  Skin: no rashes Psychiatry: mood and affect appropriate     Data Reviewed: I have personally reviewed following labs and imaging studies  CBC: Recent Labs  Lab 07/25/18 2159 07/26/18 0631 07/27/18 0223 07/28/18 0501 07/29/18 0556 07/30/18 0518  WBC 8.1 5.7 7.6 7.5 8.0 8.4  NEUTROABS 6.1  --   --   --   --   --   HGB 9.5* 9.3* 8.6* 8.7* 8.9* 9.0*  HCT 34.5* 32.7* 29.1* 29.3* 30.4* 31.0*  MCV 90.1 87.9 83.4 84.7 85.4 85.6  PLT 371 366 377 392 382 240   Basic Metabolic Panel: Recent Labs  Lab 07/26/18 0059 07/26/18 0440 07/26/18 0631 07/26/18 1702 07/27/18 0223 07/28/18 0501 07/29/18 0556 07/30/18 0518  NA 136 136 137 138 140 140 139 138  K 5.8* 5.5* 5.7* 5.1 4.2 3.4* 3.5 3.7  CL 95* 95* 96* 92* 95* 94* 94* 93*  CO2 _0 35* 35* 35* 36* 34*  GLUCOSE 119* 148* 147* 139* 114* 115* 180* 154*  BUN _1 24* 26* 17 18  CREATININE 1.15 1.06 1.08 0.97 1.06 1.15 1.03 1.05  CALCIUM 8.5* 8.7* 8.8* 8.7* 8.8* 8.4* 8.3* 8.4*  MG 2.3 2.1 2.1  --   --  1.7 2.0  --   PHOS  --   --  4.7*  --   --   --   --   --    GFR: Estimated Creatinine Clearance: 92.4 mL/min (by C-G formula based on SCr of 1.05 mg/dL). Liver Function Tests: Recent Labs  Lab 07/26/18 0631  AST 15  ALT 9  ALKPHOS 95  BILITOT 0.5  PROT 7.1  ALBUMIN 3.0*   No results for input(s): LIPASE, AMYLASE in the last 168 hours. Recent Labs  Lab 07/26/18 0440  AMMONIA 42*   Coagulation Profile: Recent Labs  Lab 07/26/18 0440  INR 1.56   Cardiac Enzymes: Recent Labs  Lab 07/26/18 0059 07/26/18 0631 07/26/18 1157  TROPONINI 0.04* 0.04* <0.03   BNP (last 3 results) No results for input(s): PROBNP in the last 8760 hours. HbA1C: No results for input(s): HGBA1C in the last 72 hours. CBG: Recent Labs  Lab 07/29/18 1622 07/29/18 2052 07/30/18 0725 07/30/18 0836 07/30/18 1309  GLUCAP 191* 203* 146* 131* 156*   Lipid Profile: No results for input(s): CHOL, HDL, LDLCALC, TRIG, CHOLHDL, LDLDIRECT in the last 72 hours. Thyroid Function Tests: No  results for input(s): TSH, T4TOTAL, FREET4, T3FREE, THYROIDAB in the last 72 hours. Anemia Panel: Recent Labs    07/29/18 0556  VITAMINB12 235  FOLATE 7.4  FERRITIN 16*  TIBC 368  IRON 23*  RETICCTPCT 2.3   Sepsis Labs: No results for input(s): PROCALCITON, LATICACIDVEN in the last 168 hours.  Recent Results (from the past 240 hour(s))  MRSA PCR Screening     Status: None   Collection Time: 07/26/18  4:04 AM  Result Value Ref Range Status   MRSA by PCR NEGATIVE NEGATIVE Final    Comment:        The GeneXpert MRSA Assay (FDA  approved for NASAL specimens only), is one component of a comprehensive MRSA colonization surveillance program. It is not intended to diagnose MRSA infection nor to guide or monitor treatment for MRSA infections. Performed at [facility], [place] [address], [place], [place] [address]          Radiology Studies: No results found.      Scheduled Meds: . arformoterol  15 mcg Nebulization BID  . budesonide (PULMICORT) nebulizer solution  0.5 mg Nebulization BID  . carvedilol  6.25 mg Oral BID WC  . chlorhexidine  15 mL Mouth Rinse BID  . doxycycline  100 mg Oral Q12H  . famotidine  20 mg Oral Daily  . furosemide  80 mg Intravenous BID  . insulin aspart  0-9 Units Subcutaneous TID WC  . latanoprost  1 drop Both Eyes QHS  . lisinopril  20 mg Oral Daily  . magnesium oxide  200 mg Oral BID  . mouth rinse  15 mL Mouth Rinse q12n4p  . potassium chloride  40 mEq Oral Daily  . rivaroxaban  20 mg Oral Daily  . rosuvastatin  20 mg Oral q1800  . spironolactone  25 mg Oral Daily   Continuous Infusions: . sodium chloride Stopped (07/30/18 0815)  . ferumoxytol       LOS: 4 days    Time spent: 35 minutes.     Elmarie Shiley, MD [facility] Pager [phone]  If 7PM-7AM, please contact night-coverage [email or web address] Password [facility] 07/30/2018, 2:27 PM

## 2018-07-30 NOTE — Anesthesia Postprocedure Evaluation (Signed)
Anesthesia Post Note  Patient: Nicholas Caldwell  Procedure(s) Performed: CARDIOVERSION (N/A )     Patient location during evaluation: Endoscopy Anesthesia Type: General Level of consciousness: awake and alert Pain management: pain level controlled Vital Signs Assessment: post-procedure vital signs reviewed and stable Respiratory status: spontaneous breathing, nonlabored ventilation, respiratory function stable and patient connected to nasal cannula oxygen Cardiovascular status: blood pressure returned to baseline and stable Postop Assessment: no apparent nausea or vomiting Anesthetic complications: no    Last Vitals:  Vitals:   07/30/18 0807 07/30/18 0814  BP: (!) 118/51 (!) 107/49  Pulse:  72  Resp: (!) 30 (!) 34  Temp: 37.3 C   SpO2: 92% 92%    Last Pain:  Vitals:   07/30/18 0814  TempSrc:   PainSc: 0-No pain                 Barnet Glasgow

## 2018-07-30 NOTE — Care Management Note (Signed)
Case Management Note  Patient Details  Name: Nicholas Caldwell MRN: [id number] Date of Birth: Dec 23, 1945  Subjective/Objective:                    Action/Plan:  Spoke w patient and wife at bedside. Discussed disposition plans. They verify they do not want to DC to SNF. They state they would like to [facility] for [facility] services. Referral placed to [facility] and accepted. Will need HH orders. Patient states he has home oxygen through [facility].  He understands that the [place] will be in contact with him to set up home sleep study and CPAP. No other CM needs identified at this time.   Expected Discharge Date:                  Expected Discharge Plan:     In-House Referral:     Discharge planning Services  CM Consult  Post Acute Care Choice:  Home Health Choice offered to:  Patient, Spouse  DME Arranged:    DME Agency:     HH Arranged:  RN, PT HH Agency:  [facility]  Status of Service:  In process, will continue to follow  If discussed at Long Length of Stay Meetings, dates discussed:    Additional Comments:  Carles Collet, RN 07/30/2018, 1:57 PM

## 2018-07-30 NOTE — Progress Notes (Signed)
Patient ID: Blaike Vickers, male   DOB: [birth year], 72 y.o.   MRN: [id number]    Advanced Heart Failure Rounding Note  PCP-Cardiologist: No primary care provider on file.   Subjective:    Gradually breathing better.  Alert/oriented.  Has not been out of bed much. PT recommending SNF.  He was cardioverted out of atrial flutter back into NSR this morning.   Hgb 9.5 -> 9.3 -> 8.6 -> 8.7 -> 8.9 -> 9.0. No overt bleeding.  Objective:   Weight Range: 126.2 kg Body mass index is 35.72 kg/m.   Vital Signs:   Temp:  [98 F (36.7 C)-99.2 F (37.3 C)] 99.2 F (37.3 C) (09/27 0807) Pulse Rate:  [72-82] 72 (09/27 0814) Resp:  [18-34] 34 (09/27 0814) BP: (107-143)/(49-70) 107/49 (09/27 0814) SpO2:  [90 %-96 %] 92 % (09/27 0814) Weight:  [126.2 kg] 126.2 kg (09/27 0313) Last BM Date: 07/28/18  Weight change: Filed Weights   07/28/18 0457 07/29/18 0441 07/30/18 0313  Weight: 129.1 kg 129.4 kg 126.2 kg    Intake/Output:   Intake/Output Summary (Last 24 hours) at 07/30/2018 0823 Last data filed at 07/30/2018 0806 Gross per 24 hour  Intake 1110 ml  Output 1300 ml  Net -190 ml      Physical Exam    General: NAD Neck: JVP 12 cm, no thyromegaly or thyroid nodule.  Lungs: Clear to auscultation bilaterally with normal respiratory effort. CV: Nonpalpable PMI.  Heart regular S1/S2, no S3/S4, no murmur. 1+ edema 1/2 to knees bilaterally.   Abdomen: Soft, nontender, no hepatosplenomegaly, no distention.  Skin: Intact without lesions or rashes.  Neurologic: Alert and oriented x 3.  Psych: Normal affect. Extremities: No clubbing or cyanosis.  HEENT: Normal.    Telemetry   Atrial flutter => NSR with cardioversion.   EKG    No new tracings.    Labs    CBC Recent Labs    07/29/18 0556 07/30/18 0518  WBC 8.0 8.4  HGB 8.9* 9.0*  HCT 30.4* 31.0*  MCV 85.4 85.6  PLT 382 409   Basic Metabolic Panel Recent Labs    07/28/18 0501 07/29/18 0556 07/30/18 0518  NA 140 139 138    K 3.4* 3.5 3.7  CL 94* 94* 93*  CO2 35* 36* 34*  GLUCOSE 115* 180* 154*  BUN 26* 17 18  CREATININE 1.15 1.03 1.05  CALCIUM 8.4* 8.3* 8.4*  MG 1.7 2.0  --    Liver Function Tests No results for input(s): AST, ALT, ALKPHOS, BILITOT, PROT, ALBUMIN in the last 72 hours. No results for input(s): LIPASE, AMYLASE in the last 72 hours. Cardiac Enzymes No results for input(s): CKTOTAL, CKMB, CKMBINDEX, TROPONINI in the last 72 hours.  BNP: BNP (last 3 results) Recent Labs    04/30/18 2049 07/25/18 2159 07/28/18 0501  BNP 1,912.5* 2,064.1* 1,381.4*    ProBNP (last 3 results) No results for input(s): PROBNP in the last 8760 hours.   D-Dimer No results for input(s): DDIMER in the last 72 hours. Hemoglobin A1C No results for input(s): HGBA1C in the last 72 hours. Fasting Lipid Panel No results for input(s): CHOL, HDL, LDLCALC, TRIG, CHOLHDL, LDLDIRECT in the last 72 hours. Thyroid Function Tests No results for input(s): TSH, T4TOTAL, T3FREE, THYROIDAB in the last 72 hours.  Invalid input(s): FREET3  Other results:   Imaging    No results found.   Medications:     Scheduled Medications: . [MAR Hold] arformoterol  15 mcg Nebulization BID  . [  MAR Hold] budesonide (PULMICORT) nebulizer solution  0.5 mg Nebulization BID  . [MAR Hold] carvedilol  6.25 mg Oral BID WC  . [MAR Hold] chlorhexidine  15 mL Mouth Rinse BID  . [MAR Hold] doxycycline  100 mg Oral Q12H  . [MAR Hold] famotidine  20 mg Oral Daily  . [MAR Hold] furosemide  80 mg Intravenous BID  . [MAR Hold] insulin aspart  0-9 Units Subcutaneous TID WC  . [MAR Hold] latanoprost  1 drop Both Eyes QHS  . [MAR Hold] lisinopril  20 mg Oral Daily  . [MAR Hold] magnesium oxide  200 mg Oral BID  . [MAR Hold] mouth rinse  15 mL Mouth Rinse q12n4p  . metolazone  2.5 mg Oral Once  . [MAR Hold] potassium chloride  40 mEq Oral Daily  . potassium chloride  40 mEq Oral Once  . [MAR Hold] rivaroxaban  20 mg Oral Daily  .  [MAR Hold] rosuvastatin  20 mg Oral q1800  . [MAR Hold] spironolactone  25 mg Oral Daily    Infusions: . [MAR Hold] sodium chloride Stopped (07/30/18 0815)  . [MAR Hold] ferumoxytol      PRN Medications: [MAR Hold] sodium chloride, [MAR Hold] albuterol, [MAR Hold] hydrALAZINE, [MAR Hold] oxyCODONE-acetaminophen    Patient Profile   Orenthal Debski is a 72 y.o. male e with PMH of chronic systolic CHF/nonischemic cardiomyopathy Echo 12/13/2016 LVEF 40-45%, COPD on home oxygen, PAF and history of non compliance.   Admitted overnight into 07/26/18 with acute hypoxic hypercarbic resp failure and A/C CHF.  Assessment/Plan   1. Acute on chronic hypercarbic respiratory failure: Suspect OHS/OSA. Now improved, awake/alert on nasal cannula.  - Will need sleep study as outpatient. Would be good if we can find a way to get him CPAP at home.  2. COPD: Emphysema on CT chest. On home oxygen. Needs to quit smoking.  3. Acute on chronic systolic CHF: Nonischemic cardiomyopathy, nonobstructive CAD on prior cath. Last echo in 6/19 with EF 40-45%, PASP 62 mmHg. He is still volume overloaded on exam and creatinine is stable.  Somewhat sluggish diuresis yesterday.  - Continue Lasix 80 mg IV bid today and will give a dose of metolazone.  - Continue current lisinopril and spironolactone.  - Can increase Coreg back to home 12.5 mg bid.    4. Suspected long QT syndrome: Refused ICD in past, followed by Dr. Caryl Comes.  5. Hip arthritis: Limiting, awaiting eventual THR when cleared.  6. Atrial fibrillation/flutter: Paroxysmal.Options for antiarrhythmic treatment are very limited with large LA, long QT (no Tikosyn or amiodarone), and nonobstructive CAD.  Yesterday, he went into rate-controlled atrial flutter.  Successful DCCV to NSR this morning.  - Continue Xarelto. - Continue Coreg.  7. Anemia: Fe deficient, gave feraheme.   Loralie Champagne 07/30/2018 8:23 AM

## 2018-07-31 ENCOUNTER — Encounter (HOSPITAL_COMMUNITY): Payer: Self-pay | Admitting: Cardiology

## 2018-07-31 LAB — CBC
HEMATOCRIT: 31.3 % — AB (ref 39.0–52.0)
HEMOGLOBIN: 9 g/dL — AB (ref 13.0–17.0)
MCH: 24.5 pg — AB (ref 26.0–34.0)
MCHC: 28.8 g/dL — AB (ref 30.0–36.0)
MCV: 85.3 fL (ref 78.0–100.0)
Platelets: 355 10*3/uL (ref 150–400)
RBC: 3.67 MIL/uL — ABNORMAL LOW (ref 4.22–5.81)
RDW: 18.2 % — AB (ref 11.5–15.5)
WBC: 8.7 10*3/uL (ref 4.0–10.5)

## 2018-07-31 LAB — BASIC METABOLIC PANEL
ANION GAP: 9 (ref 5–15)
BUN: 20 mg/dL (ref 8–23)
CHLORIDE: 90 mmol/L — AB (ref 98–111)
CO2: 37 mmol/L — AB (ref 22–32)
Calcium: 8.7 mg/dL — ABNORMAL LOW (ref 8.9–10.3)
Creatinine, Ser: 1.04 mg/dL (ref 0.61–1.24)
GFR calc Af Amer: >60 mL/min (ref >60)
GLUCOSE: 130 mg/dL — AB (ref 70–99)
POTASSIUM: 3.9 mmol/L (ref 3.5–5.1)
Sodium: 136 mmol/L (ref 135–145)

## 2018-07-31 LAB — GLUCOSE, CAPILLARY
GLUCOSE-CAPILLARY: 155 mg/dL — AB (ref 70–99)
GLUCOSE-CAPILLARY: 160 mg/dL — AB (ref 70–99)
Glucose-Capillary: 135 mg/dL — ABNORMAL HIGH (ref 70–99)
Glucose-Capillary: 163 mg/dL — ABNORMAL HIGH (ref 70–99)

## 2018-07-31 MED ORDER — DIAZEPAM 5 MG PO TABS
5.0000 mg | ORAL_TABLET | Freq: Every day | ORAL | Status: DC | PRN
  Administered 2018-07-31 – 2018-08-01 (×2): 5 mg via ORAL
  Filled 2018-07-31 (×2): qty 1

## 2018-07-31 NOTE — Progress Notes (Signed)
Pt is stable, vitals stable (temp 99), condom cath changed, pt was in chair earlier this am sited for a while, wife in a bed side and is updated, will continue to monitor  Palma Holter, RN

## 2018-07-31 NOTE — Progress Notes (Signed)
PROGRESS NOTE    Nicholas Caldwell  NUU:[id number] DOB: 11-08-1945 DOA: 07/25/2018 PCP: [facility]    Brief Narrative: 47yoM with hx COPD, CHF (EF 25%), Afib (on xarelto), Tobacco absue, Chronic hypoxia (on 3L O2), HTN, DM, Chronic pain, and Noncompliance, presents to the ER c/o SOB and Increasing BLE edema x 4 days, found to be hypoxic on 84% in ER. Pox 98% on BIPAP. In the ER patient found to be hypoxic and CXR showed pulmonary edema. He was started on BIPAP 16/6 with ABG after on BIPAP showing acute-on-chronic hypercapnea (7.27/73/83/34). IPAP increased from 16 to 18, EPAP kept at 6. No lasix or nebs given yet. PCCM then consulted. When I arrived in ER patient is being prepared to leave room. RN says patient going to Head CT for AMS then admitted to [facility]. I halted the transfer, examined patient, then discussed with hospitalist. It seems earlier tonight he was more somnolent and only aroused to sternal rub. At time of my exam however, he arouses to voice and obeys commands, afocal neuro exam. Will hold off on Head CT for now. Will admit patient to ICU since he is on BIPAP and high risk of decompensating and requiring intubation. Discussed plan of care with patient's wife at bedside.     Assessment & Plan:   Principal Problem:   Acute respiratory failure with hypoxia and hypercapnia (HCC) Active Problems:   Type 2 diabetes mellitus with neuropathy   PAF (paroxysmal atrial fibrillation) (HCC)   Nonischemic cardiomyopathy (HCC)   CAD - Non-obstructive by HKV4/25   Chronic systolic CHF (congestive heart failure) (HCC)   COPD (chronic obstructive pulmonary disease) (HCC)   OSA (obstructive sleep apnea)   Pulmonary hypertension (HCC)   Acute exacerbation of CHF (congestive heart failure) (HCC)   1-Acute-on-Chronic Hypercapnic and Hypoxic Respiratory failure; Pulmonary edema; CHF exacerbation,Progressing  Pulmonary HTN: Likely OSA, needs CPAP. CM consulted.  Required BIPAP on  admission.  Off BIPAP.  CPAP at night. Refuse.  On IV lasix. Heart failure team following.  Doxy to cover for PNA> Weight down to 272.   Acute systolic HF exacerbation.  On lisinopril. Spironolactone.  IV lasix.  HF team following.  Negative 9 L.  Weight; 260---283--284--285 --278. --272 Weight on last hospitalization day was at 272 Chest x ray; stable vascular congestion, left infiltrate. Effusion. Covering for PNA with doxy On IV lasix. Will follow cardio recommendation for lasix.   COPD;  Continue with nebulizer.   A fib; Continue with xarelto.  Underwent cardioversion 9-27  DM;  SSI.   AKI, hyperkalemia; resolved.   Chronic pain management. Percocet PR.   Anemia; monitor hb.  Iron 23, ferritin 16.  Continue  iron supplement.   DVT prophylaxis: Xarelto.  Code Status: full code.  Family Communication: care discussed with patient, wife Disposition Plan: continue with management of HF, IV lasix. Follow cardiology recommendations.   Consultants:  Cardiology CCM   Procedures:   none  Antimicrobials:  none  Subjective: Taking a nap, sleepy, would open eyes and answer some questions.    Objective: Vitals:   07/30/18 0814 07/30/18 1900 07/31/18 0636 07/31/18 1223  BP: (!) 107/49 121/61 133/63 110/70  Pulse: 72 70 70   Resp: (!) 34 _0 Temp:  98.2 F (36.8 C) 99.4 F (37.4 C) 99 F (37.2 C)  TempSrc:  Oral Oral Oral  SpO2: 92% 98% 95% 97%  Weight:   123.7 kg   Height:  Intake/Output Summary (Last 24 hours) at 07/31/2018 1614 Last data filed at 07/31/2018 1200 Gross per 24 hour  Intake 1080 ml  Output 4050 ml  Net -2970 ml   Filed Weights   07/29/18 0441 07/30/18 0313 07/31/18 0636  Weight: 129.4 kg 126.2 kg 123.7 kg    Examination:  General exam: NAD Respiratory system: Normal respiratory effort, crackles bases.  Cardiovascular system: S 1, S 2 RRR Gastrointestinal system: BS present, soft, nt Central nervous system: Non  focal.  Extremities: Symmetric power.   Skin: no rashes Psychiatry: mood and affect appropriate    Data Reviewed: I have personally reviewed following labs and imaging studies  CBC: Recent Labs  Lab 07/25/18 2159  07/27/18 0223 07/28/18 0501 07/29/18 0556 07/30/18 0518 07/31/18 0552  WBC 8.1   < > 7.6 7.5 8.0 8.4 8.7  NEUTROABS 6.1  --   --   --   --   --   --   HGB 9.5*   < > 8.6* 8.7* 8.9* 9.0* 9.0*  HCT 34.5*   < > 29.1* 29.3* 30.4* 31.0* 31.3*  MCV 90.1   < > 83.4 84.7 85.4 85.6 85.3  PLT 371   < > 377 392 382 343 355   < > = values in this interval not displayed.   Basic Metabolic Panel: Recent Labs  Lab 07/26/18 0059 07/26/18 0440 07/26/18 0631  07/27/18 0223 07/28/18 0501 07/29/18 0556 07/30/18 0518 07/31/18 0552  NA 136 136 137   < > 140 140 139 138 136  K 5.8* 5.5* 5.7*   < > 4.2 3.4* 3.5 3.7 3.9  CL 95* 95* 96*   < > 95* 94* 94* 93* 90*  CO2 _0 < > 35* 35* 36* 34* 37*  GLUCOSE 119* 148* 147*   < > 114* 115* 180* 154* 130*  BUN _1 < > 24* 26* _2 CREATININE 1.15 1.06 1.08   < > 1.06 1.15 1.03 1.05 1.04  CALCIUM 8.5* 8.7* 8.8*   < > 8.8* 8.4* 8.3* 8.4* 8.7*  MG 2.3 2.1 2.1  --   --  1.7 2.0  --   --   PHOS  --   --  4.7*  --   --   --   --   --   --    < > = values in this interval not displayed.   GFR: Estimated Creatinine Clearance: 92.4 mL/min (by C-G formula based on SCr of 1.04 mg/dL). Liver Function Tests: Recent Labs  Lab 07/26/18 0631  AST 15  ALT 9  ALKPHOS 95  BILITOT 0.5  PROT 7.1  ALBUMIN 3.0*   No results for input(s): LIPASE, AMYLASE in the last 168 hours. Recent Labs  Lab 07/26/18 0440  AMMONIA 42*   Coagulation Profile: Recent Labs  Lab 07/26/18 0440  INR 1.56   Cardiac Enzymes: Recent Labs  Lab 07/26/18 0059 07/26/18 0631 07/26/18 1157  TROPONINI 0.04* 0.04* <0.03   BNP (last 3 results) No results for input(s): PROBNP in the last 8760 hours. HbA1C: No results for input(s): HGBA1C in the  last 72 hours. CBG: Recent Labs  Lab 07/30/18 1712 07/30/18 2222 07/31/18 0741 07/31/18 1145 07/31/18 1608  GLUCAP 163* 161* 135* 155* 163*   Lipid Profile: No results for input(s): CHOL, HDL, LDLCALC, TRIG, CHOLHDL, LDLDIRECT in the last 72 hours. Thyroid Function Tests: No results for input(s): TSH, T4TOTAL, FREET4, T3FREE, THYROIDAB in the  last 72 hours. Anemia Panel: Recent Labs    07/29/18 0556  VITAMINB12 235  FOLATE 7.4  FERRITIN 16*  TIBC 368  IRON 23*  RETICCTPCT 2.3   Sepsis Labs: No results for input(s): PROCALCITON, LATICACIDVEN in the last 168 hours.  Recent Results (from the past 240 hour(s))  MRSA PCR Screening     Status: None   Collection Time: 07/26/18  4:04 AM  Result Value Ref Range Status   MRSA by PCR NEGATIVE NEGATIVE Final    Comment:        The GeneXpert MRSA Assay (FDA approved for NASAL specimens only), is one component of a comprehensive MRSA colonization surveillance program. It is not intended to diagnose MRSA infection nor to guide or monitor treatment for MRSA infections. Performed at [facility], [place] [address] [place],  [address]          Radiology Studies: No results found.      Scheduled Meds: . arformoterol  15 mcg Nebulization BID  . budesonide (PULMICORT) nebulizer solution  0.5 mg Nebulization BID  . carvedilol  6.25 mg Oral BID WC  . chlorhexidine  15 mL Mouth Rinse BID  . doxycycline  100 mg Oral Q12H  . famotidine  20 mg Oral Daily  . ferrous sulfate  325 mg Oral BID WC  . furosemide  80 mg Intravenous BID  . insulin aspart  0-9 Units Subcutaneous TID WC  . latanoprost  1 drop Both Eyes QHS  . lisinopril  20 mg Oral Daily  . magnesium oxide  200 mg Oral BID  . mouth rinse  15 mL Mouth Rinse q12n4p  . potassium chloride  40 mEq Oral Daily  . rivaroxaban  20 mg Oral Daily  . rosuvastatin  20 mg Oral q1800  . spironolactone  25 mg Oral Daily   Continuous Infusions: . sodium chloride  Stopped (07/30/18 0815)  . ferumoxytol       LOS: 5 days    Time spent: 35 minutes.     Elmarie Shiley, MD [facility] Pager [phone]  If 7PM-7AM, please contact night-coverage [email or web address] Password [facility] 07/31/2018, 4:14 PM

## 2018-07-31 NOTE — Progress Notes (Signed)
RT Note: Patient's RN called for the patient to go on his CPAP because he wanted to take a nap and his MD was requesting for him to wear it during the day while napping.. RT came up to place him on his CPAP but patient refused to go on his BIPAP. He stated that he did not want his CPAP. His wife was at bedside as well. RT notified his RN that he refused to wear his Cpap. He continues on his nasal cannula with no complications. RT will continue to assist as needed.

## 2018-07-31 NOTE — Progress Notes (Signed)
Pt refusing CPAP for tonight

## 2018-08-01 LAB — BASIC METABOLIC PANEL
Anion gap: 13 (ref 5–15)
BUN: 24 mg/dL — ABNORMAL HIGH (ref 8–23)
CHLORIDE: 91 mmol/L — AB (ref 98–111)
CO2: 31 mmol/L (ref 22–32)
CREATININE: 1.06 mg/dL (ref 0.61–1.24)
Calcium: 8.6 mg/dL — ABNORMAL LOW (ref 8.9–10.3)
GFR calc non Af Amer: >60 mL/min (ref >60)
Glucose, Bld: 206 mg/dL — ABNORMAL HIGH (ref 70–99)
POTASSIUM: 3.8 mmol/L (ref 3.5–5.1)
SODIUM: 135 mmol/L (ref 135–145)

## 2018-08-01 LAB — CBC
HEMATOCRIT: 32.8 % — AB (ref 39.0–52.0)
HEMOGLOBIN: 9.7 g/dL — AB (ref 13.0–17.0)
MCH: 24.9 pg — ABNORMAL LOW (ref 26.0–34.0)
MCHC: 29.6 g/dL — ABNORMAL LOW (ref 30.0–36.0)
MCV: 84.1 fL (ref 78.0–100.0)
PLATELETS: 408 10*3/uL — AB (ref 150–400)
RBC: 3.9 MIL/uL — AB (ref 4.22–5.81)
RDW: 18.6 % — ABNORMAL HIGH (ref 11.5–15.5)
WBC: 9.4 10*3/uL (ref 4.0–10.5)

## 2018-08-01 LAB — GLUCOSE, CAPILLARY
GLUCOSE-CAPILLARY: 157 mg/dL — AB (ref 70–99)
Glucose-Capillary: 147 mg/dL — ABNORMAL HIGH (ref 70–99)
Glucose-Capillary: 204 mg/dL — ABNORMAL HIGH (ref 70–99)
Glucose-Capillary: 190 mg/dL — ABNORMAL HIGH (ref 70–99)

## 2018-08-01 MED ORDER — OXYCODONE-ACETAMINOPHEN 5-325 MG PO TABS
1.0000 | ORAL_TABLET | Freq: Four times a day (QID) | ORAL | Status: DC | PRN
  Administered 2018-08-01: 2 via ORAL
  Administered 2018-08-01 (×2): 1 via ORAL
  Administered 2018-08-02 (×2): 2 via ORAL
  Filled 2018-08-01 (×2): qty 1
  Filled 2018-08-01 (×3): qty 2
  Filled 2018-08-01: qty 1

## 2018-08-01 NOTE — Progress Notes (Signed)
Patient refusing CPAP. Says his nose is stopped up and would rather just wear the Nasal Cannula. RT will continue to monitor.

## 2018-08-01 NOTE — Progress Notes (Signed)
Pt is sitting in a chair this time, was concerned regarding the dose of his pain medicine, MD fixed that, denies CP and distress so far and vitals stable  Palma Holter, RN

## 2018-08-01 NOTE — Progress Notes (Signed)
PROGRESS NOTE    Nicholas Caldwell  NFA:[id number] DOB: 27-Apr-1946 DOA: 07/25/2018 PCP: [facility]    Brief Narrative: 59yoM with hx COPD, CHF (EF 25%), Afib (on xarelto), Tobacco absue, Chronic hypoxia (on 3L O2), HTN, DM, Chronic pain, and Noncompliance, presents to the ER c/o SOB and Increasing BLE edema x 4 days, found to be hypoxic on 84% in ER. Pox 98% on BIPAP. In the ER patient found to be hypoxic and CXR showed pulmonary edema. He was started on BIPAP 16/6 with ABG after on BIPAP showing acute-on-chronic hypercapnea (7.27/73/83/34). IPAP increased from 16 to 18, EPAP kept at 6. No lasix or nebs given yet. PCCM then consulted. When I arrived in ER patient is being prepared to leave room. RN says patient going to Head CT for AMS then admitted to [facility]. I halted the transfer, examined patient, then discussed with hospitalist. It seems earlier tonight he was more somnolent and only aroused to sternal rub. At time of my exam however, he arouses to voice and obeys commands, afocal neuro exam. Will hold off on Head CT for now. Will admit patient to ICU since he is on BIPAP and high risk of decompensating and requiring intubation. Discussed plan of care with patient's wife at bedside.     Assessment & Plan:   Principal Problem:   Acute respiratory failure with hypoxia and hypercapnia (HCC) Active Problems:   Type 2 diabetes mellitus with neuropathy   PAF (paroxysmal atrial fibrillation) (HCC)   Nonischemic cardiomyopathy (HCC)   CAD - Non-obstructive by ION6/29   Chronic systolic CHF (congestive heart failure) (HCC)   COPD (chronic obstructive pulmonary disease) (HCC)   OSA (obstructive sleep apnea)   Pulmonary hypertension (HCC)   Acute exacerbation of CHF (congestive heart failure) (HCC)   1-Acute-on-Chronic Hypercapnic and Hypoxic Respiratory failure; Pulmonary edema; CHF exacerbation,Progressing  Pulmonary HTN: Likely OSA, needs CPAP. CM consulted.  Required BIPAP on  admission.  Off BIPAP.  CPAP at night. Refuse.  On IV lasix. Heart failure team following.  Doxy to cover for PNA> Weight down to 272. -263. Await cardiology recommendation.  Acute systolic HF exacerbation.  On lisinopril. Spironolactone.  IV lasix.  HF team following.  Negative 9 L.  Weight; 260---283--284--285 --278. --272--263 Weight on last hospitalization day was at 272 Chest x ray; stable vascular congestion, left infiltrate. Effusion. Covering for PNA with doxy On IV lasix. Will follow cardio recommendation for lasix.   COPD;  Continue with nebulizer.   A fib; Continue with xarelto.  Underwent cardioversion 9-27  DM;  SSI.   AKI, hyperkalemia; resolved.   Chronic pain management. Percocet PR.  He relates he take at home 10 mg of percocet. Will try again 10 mg, if he get sleep will have to decreased back to 5 mg   Anemia; monitor hb.  Iron 23, ferritin 16.  Continue  iron supplement.   DVT prophylaxis: Xarelto.  Code Status: full code.  Family Communication: care discussed with patient, wife Disposition Plan: continue with management of HF, IV lasix. Follow cardiology recommendations.   Consultants:  Cardiology CCM   Procedures:   none  Antimicrobials:  none  Subjective: He is alert, sitting in the recliner. Having a lot hip pain. Cant walk due to the pain. He takes at home 10 mg of percocet. He has been taking that medication for long time and doesn't get sleepy   Objective: Vitals:   08/01/18 0500 08/01/18 0659 08/01/18 1004 08/01/18 1008  BP:  115/62  Pulse:  70 73   Resp:  18 18   Temp:  98.3 F (36.8 C)    TempSrc:  Oral    SpO2:  98% 93% 93%  Weight: 119.6 kg     Height:        Intake/Output Summary (Last 24 hours) at 08/01/2018 1116 Last data filed at 08/01/2018 0858 Gross per 24 hour  Intake 480 ml  Output 3500 ml  Net -3020 ml   Filed Weights   07/30/18 0313 07/31/18 0636 08/01/18 0500  Weight: 126.2 kg 123.7 kg 119.6 kg     Examination:  General exam: NAD Respiratory system: crackles bases.  Cardiovascular system: S 1, S 2 RRR Gastrointestinal system: BS present, soft, nt Central nervous system: non focal.  Extremities: symmetric power.  Skin: No rashes Psychiatry: mood and affect appropriate    Data Reviewed: I have personally reviewed following labs and imaging studies  CBC: Recent Labs  Lab 07/25/18 2159  07/28/18 0501 07/29/18 0556 07/30/18 0518 07/31/18 0552 08/01/18 0513  WBC 8.1   < > 7.5 8.0 8.4 8.7 9.4  NEUTROABS 6.1  --   --   --   --   --   --   HGB 9.5*   < > 8.7* 8.9* 9.0* 9.0* 9.7*  HCT 34.5*   < > 29.3* 30.4* 31.0* 31.3* 32.8*  MCV 90.1   < > 84.7 85.4 85.6 85.3 84.1  PLT 371   < > 392 382 343 355 408*   < > = values in this interval not displayed.   Basic Metabolic Panel: Recent Labs  Lab 07/26/18 0059 07/26/18 0440 07/26/18 0631  07/28/18 0501 07/29/18 0556 07/30/18 0518 07/31/18 0552 08/01/18 0513  NA 136 136 137   < > 140 139 138 136 135  K 5.8* 5.5* 5.7*   < > 3.4* 3.5 3.7 3.9 3.8  CL 95* 95* 96*   < > 94* 94* 93* 90* 91*  CO2 _0 < > 35* 36* 34* 37* 31  GLUCOSE 119* 148* 147*   < > 115* 180* 154* 130* 206*  BUN _1 < > 26* _2 24*  CREATININE 1.15 1.06 1.08   < > 1.15 1.03 1.05 1.04 1.06  CALCIUM 8.5* 8.7* 8.8*   < > 8.4* 8.3* 8.4* 8.7* 8.6*  MG 2.3 2.1 2.1  --  1.7 2.0  --   --   --   PHOS  --   --  4.7*  --   --   --   --   --   --    < > = values in this interval not displayed.   GFR: Estimated Creatinine Clearance: 89.2 mL/min (by C-G formula based on SCr of 1.06 mg/dL). Liver Function Tests: Recent Labs  Lab 07/26/18 0631  AST 15  ALT 9  ALKPHOS 95  BILITOT 0.5  PROT 7.1  ALBUMIN 3.0*   No results for input(s): LIPASE, AMYLASE in the last 168 hours. Recent Labs  Lab 07/26/18 0440  AMMONIA 42*   Coagulation Profile: Recent Labs  Lab 07/26/18 0440  INR 1.56   Cardiac Enzymes: Recent Labs  Lab 07/26/18 0059  07/26/18 0631 07/26/18 1157  TROPONINI 0.04* 0.04* <0.03   BNP (last 3 results) No results for input(s): PROBNP in the last 8760 hours. HbA1C: No results for input(s): HGBA1C in the last 72 hours. CBG: Recent Labs  Lab 07/31/18 0741 07/31/18 1145 07/31/18 1608  07/31/18 2145 08/01/18 0755  GLUCAP 135* 155* 163* 160* 157*   Lipid Profile: No results for input(s): CHOL, HDL, LDLCALC, TRIG, CHOLHDL, LDLDIRECT in the last 72 hours. Thyroid Function Tests: No results for input(s): TSH, T4TOTAL, FREET4, T3FREE, THYROIDAB in the last 72 hours. Anemia Panel: No results for input(s): VITAMINB12, FOLATE, FERRITIN, TIBC, IRON, RETICCTPCT in the last 72 hours. Sepsis Labs: No results for input(s): PROCALCITON, LATICACIDVEN in the last 168 hours.  Recent Results (from the past 240 hour(s))  MRSA PCR Screening     Status: None   Collection Time: 07/26/18  4:04 AM  Result Value Ref Range Status   MRSA by PCR NEGATIVE NEGATIVE Final    Comment:        The GeneXpert MRSA Assay (FDA approved for NASAL specimens only), is one component of a comprehensive MRSA colonization surveillance program. It is not intended to diagnose MRSA infection nor to guide or monitor treatment for MRSA infections. Performed at [facility], [place] [address], [place], [place] [address]          Radiology Studies: No results found.      Scheduled Meds: . arformoterol  15 mcg Nebulization BID  . budesonide (PULMICORT) nebulizer solution  0.5 mg Nebulization BID  . carvedilol  6.25 mg Oral BID WC  . chlorhexidine  15 mL Mouth Rinse BID  . doxycycline  100 mg Oral Q12H  . famotidine  20 mg Oral Daily  . ferrous sulfate  325 mg Oral BID WC  . furosemide  80 mg Intravenous BID  . insulin aspart  0-9 Units Subcutaneous TID WC  . latanoprost  1 drop Both Eyes QHS  . lisinopril  20 mg Oral Daily  . magnesium oxide  200 mg Oral BID  . mouth rinse  15 mL Mouth Rinse q12n4p  . potassium chloride   40 mEq Oral Daily  . rivaroxaban  20 mg Oral Daily  . rosuvastatin  20 mg Oral q1800  . spironolactone  25 mg Oral Daily   Continuous Infusions: . sodium chloride Stopped (07/30/18 0815)  . ferumoxytol       LOS: 6 days    Time spent: 35 minutes.     Elmarie Shiley, MD [facility] Pager [phone]  If 7PM-7AM, please contact night-coverage [email or web address] Password [facility] 08/01/2018, 11:16 AM

## 2018-08-01 NOTE — Progress Notes (Signed)
Paged Cardiologist regarding follow up per MD request.  Palma Holter, RN

## 2018-08-02 LAB — CBC
HEMATOCRIT: 32 % — AB (ref 39.0–52.0)
HEMOGLOBIN: 9.5 g/dL — AB (ref 13.0–17.0)
MCH: 25.1 pg — AB (ref 26.0–34.0)
MCHC: 29.7 g/dL — AB (ref 30.0–36.0)
MCV: 84.4 fL (ref 78.0–100.0)
Platelets: 393 10*3/uL (ref 150–400)
RBC: 3.79 MIL/uL — ABNORMAL LOW (ref 4.22–5.81)
RDW: 18.7 % — ABNORMAL HIGH (ref 11.5–15.5)
WBC: 9.3 10*3/uL (ref 4.0–10.5)

## 2018-08-02 LAB — BASIC METABOLIC PANEL
ANION GAP: 10 (ref 5–15)
BUN: 22 mg/dL (ref 8–23)
CALCIUM: 8.8 mg/dL — AB (ref 8.9–10.3)
CO2: 33 mmol/L — AB (ref 22–32)
Chloride: 91 mmol/L — ABNORMAL LOW (ref 98–111)
Creatinine, Ser: 0.94 mg/dL (ref 0.61–1.24)
GFR calc Af Amer: >60 mL/min (ref >60)
GFR calc non Af Amer: >60 mL/min (ref >60)
GLUCOSE: 142 mg/dL — AB (ref 70–99)
Potassium: 3.9 mmol/L (ref 3.5–5.1)
Sodium: 134 mmol/L — ABNORMAL LOW (ref 135–145)

## 2018-08-02 LAB — GLUCOSE, CAPILLARY
GLUCOSE-CAPILLARY: 151 mg/dL — AB (ref 70–99)
Glucose-Capillary: 215 mg/dL — ABNORMAL HIGH (ref 70–99)

## 2018-08-02 MED ORDER — CARVEDILOL 6.25 MG PO TABS: 6.2500 mg | ORAL_TABLET | Freq: Two times a day (BID) | ORAL | 0 refills | Status: DC

## 2018-08-02 MED ORDER — TORSEMIDE 20 MG PO TABS: 60.0000 mg | ORAL_TABLET | Freq: Two times a day (BID) | ORAL | 0 refills | Status: DC

## 2018-08-02 MED ORDER — DOXYCYCLINE HYCLATE 100 MG PO TABS: 100.0000 mg | ORAL_TABLET | Freq: Two times a day (BID) | ORAL | 0 refills | Status: DC

## 2018-08-02 MED ORDER — GUAIFENESIN 400 MG PO TABS: 400.0000 mg | ORAL_TABLET | Freq: Four times a day (QID) | ORAL | 0 refills | Status: DC | PRN

## 2018-08-02 MED ORDER — SODIUM CHLORIDE 0.9 % IV SOLN
510.0000 mg | Freq: Once | INTRAVENOUS | Status: AC
  Administered 2018-08-02: 510 mg via INTRAVENOUS
  Filled 2018-08-02: qty 17

## 2018-08-02 MED ORDER — TORSEMIDE 20 MG PO TABS: 60.0000 mg | ORAL_TABLET | Freq: Two times a day (BID) | ORAL | Status: DC

## 2018-08-02 MED ORDER — INSULIN ASPART PROT & ASPART (70-30 MIX) 100 UNIT/ML PEN: 5.0000 [IU] | PEN_INJECTOR | Freq: Two times a day (BID) | SUBCUTANEOUS | 11 refills | Status: DC

## 2018-08-02 NOTE — Progress Notes (Addendum)
Patient ID: Nicholas Caldwell, male   DOB: [birth year], 72 y.o.   MRN: [id number]    Advanced Heart Failure Rounding Note  PCP-Cardiologist: No primary care provider on file.   Subjective:    S/p DCCV 07/30/18. Remains in NSR.  Diuresed with 80 mg IV lasix BID over the weekend. Weight down to 268 lbs (highest weight 285 lbs). Creatinine stable 0.94.  Hgb stable 9.5.   Feels good. Denies CP, SOB, orthopnea. Wants to go home.  Objective:   Weight Range: 121.6 kg Body mass index is 34.41 kg/m.   Vital Signs:   Temp:  [98.4 F (36.9 C)] 98.4 F (36.9 C) (09/30 0636) Pulse Rate:  [67-73] 67 (09/30 0636) Resp:  [18-19] 19 (09/30 0636) BP: (99-121)/(58-67) 104/58 (09/30 0636) SpO2:  [93 %-99 %] 96 % (09/30 0636) FiO2 (%):  [32 %] 32 % (09/29 1008) Weight:  [121.6 kg] 121.6 kg (09/30 0144) Last BM Date: 08/01/18  Weight change: Filed Weights   07/31/18 0636 08/01/18 0500 08/02/18 0144  Weight: 123.7 kg 119.6 kg 121.6 kg    Intake/Output:   Intake/Output Summary (Last 24 hours) at 08/02/2018 0841 Last data filed at 08/02/2018 0621 Gross per 24 hour  Intake 960 ml  Output 2825 ml  Net -1865 ml      Physical Exam    General:No resp difficulty. HEENT: Normal Neck: Supple. JVP 5-6. Carotids 2+ bilat; no bruits. No thyromegaly or nodule noted. Cor: PMI nonpalpable. RRR, No M/G/R noted Lungs: CTAB, normal effort. Abdomen: Soft, non-tender, non-distended, no HSM. No bruits or masses. +BS  Extremities: No cyanosis, clubbing, or rash. R and LLE no edema.  Neuro: Alert & orientedx3, cranial nerves grossly intact. moves all 4 extremities w/o difficulty. Affect pleasant   Telemetry   NSR 60s. Personally reviewed.   EKG    No new tracings.    Labs    CBC Recent Labs    08/01/18 0513 08/02/18 0649  WBC 9.4 9.3  HGB 9.7* 9.5*  HCT 32.8* 32.0*  MCV 84.1 84.4  PLT 408* 789   Basic Metabolic Panel Recent Labs    08/01/18 0513 08/02/18 0649  NA 135 134*  K 3.8 3.9   CL 91* 91*  CO2 31 33*  GLUCOSE 206* 142*  BUN 24* 22  CREATININE 1.06 0.94  CALCIUM 8.6* 8.8*   Liver Function Tests No results for input(s): AST, ALT, ALKPHOS, BILITOT, PROT, ALBUMIN in the last 72 hours. No results for input(s): LIPASE, AMYLASE in the last 72 hours. Cardiac Enzymes No results for input(s): CKTOTAL, CKMB, CKMBINDEX, TROPONINI in the last 72 hours.  BNP: BNP (last 3 results) Recent Labs    04/30/18 2049 07/25/18 2159 07/28/18 0501  BNP 1,912.5* 2,064.1* 1,381.4*    ProBNP (last 3 results) No results for input(s): PROBNP in the last 8760 hours.   D-Dimer No results for input(s): DDIMER in the last 72 hours. Hemoglobin A1C No results for input(s): HGBA1C in the last 72 hours. Fasting Lipid Panel No results for input(s): CHOL, HDL, LDLCALC, TRIG, CHOLHDL, LDLDIRECT in the last 72 hours. Thyroid Function Tests No results for input(s): TSH, T4TOTAL, T3FREE, THYROIDAB in the last 72 hours.  Invalid input(s): FREET3  Other results:   Imaging    No results found.   Medications:     Scheduled Medications: . arformoterol  15 mcg Nebulization BID  . budesonide (PULMICORT) nebulizer solution  0.5 mg Nebulization BID  . carvedilol  6.25 mg Oral BID WC  . chlorhexidine  15 mL Mouth Rinse BID  . doxycycline  100 mg Oral Q12H  . famotidine  20 mg Oral Daily  . ferrous sulfate  325 mg Oral BID WC  . furosemide  80 mg Intravenous BID  . insulin aspart  0-9 Units Subcutaneous TID WC  . latanoprost  1 drop Both Eyes QHS  . lisinopril  20 mg Oral Daily  . magnesium oxide  200 mg Oral BID  . mouth rinse  15 mL Mouth Rinse q12n4p  . potassium chloride  40 mEq Oral Daily  . rivaroxaban  20 mg Oral Daily  . rosuvastatin  20 mg Oral q1800  . spironolactone  25 mg Oral Daily    Infusions: . sodium chloride Stopped (07/30/18 0815)  . ferumoxytol      PRN Medications: sodium chloride, albuterol, diazepam, hydrALAZINE,  oxyCODONE-acetaminophen    Patient Profile   Nicholas Caldwell is a 72 y.o. male e with PMH of chronic systolic CHF/nonischemic cardiomyopathy Echo 12/13/2016 LVEF 40-45%, COPD on home oxygen, PAF and history of non compliance.   Admitted overnight into 07/26/18 with acute hypoxic hypercarbic resp failure and A/C CHF.  Assessment/Plan   1. Acute on chronic hypercarbic respiratory failure: Suspect OHS/OSA. Now improved, awake/alert on nasal cannula.  - Will need sleep study as outpatient. Would be good if we can find a way to get him CPAP at home. No change.  2. COPD: Emphysema on CT chest. On home oxygen. Needs to quit smoking.  No change. 3. Acute on chronic systolic CHF: Nonischemic cardiomyopathy, nonobstructive CAD on prior cath. Last echo in 6/19 with EF 40-45%, PASP 62 mmHg. Volume status improved. - Stop IV lasix. Restart torsemide 60 mg BID (increased from 40 mg BID). He has already received IV lasix this am. - Continue current lisinopril 20 mg daily and spironolactone 25 mg daily.  - Continue Coreg 6.25 mg bid, BP soft today so will not titrate up.   4. Suspected long QT syndrome: Refused ICD in past, followed by Dr. Caryl Comes. No amio or tikosyn. 5. Hip arthritis: Limiting, awaiting eventual THR when cleared.  6. Atrial fibrillation/flutter: Paroxysmal.Options for antiarrhythmic treatment are very limited with large LA, long QT (no Tikosyn or amiodarone), and nonobstructive CAD.  Yesterday, he went into rate-controlled atrial flutter.  Successful DCCV to NSR 9/27. Remains in NSR. - Continue Xarelto. - Continue Coreg.  7. Anemia: Fe deficient, gave feraheme. Hemoglobin stable 9.5  Heart failure team will sign off as of 08/02/18  HF Medication Recommendations for Home: Coreg 6.25 mg BID Torsemide 60 mg BID (Increased from 40 mg BID) Lisinopril 20 mg daily Magox 200 mg BID Potassium 40 meq daily Xarelto 20 mg daily Spiro 25 mg daily  Other recommendations (Labs,testing,  etc): BMET, EKG at follow up  Would be ideal if he could get CPAP for home. Will need outpatient sleep study.   Follow up as an outpatient: 08/10/18 3 pm.   Georgiana Shore, NP 08/02/2018 8:41 AM  Patient seen with NP, agree with the above note.  He remains in NSR today.  Volume status much improved from admission, weight down considerably.    Agree with transition to po diuretics and stable for home from my standpoint with the above cardiac medication list.  He will have followup in CHF clinic.   Loralie Champagne 08/02/2018 11:04 AM

## 2018-08-02 NOTE — Discharge Summary (Signed)
Physician Discharge Summary  Nicholas Caldwell OXB:[id number] DOB: 1945-11-06 DOA: 07/25/2018  PCP: [facility]  Admit date: 07/25/2018 Discharge date: 08/03/2018  Admitted From: Home  Disposition:  Home   Recommendations for Outpatient Follow-up:  1. Follow up with PCP in 1-2 weeks 2. Please obtain BMP/CBC in one week 3. Needs Sleep study  4. Follow up with HF team.   Discharge Condition: Stable.  CODE STATUS: full code Diet recommendation: Heart Healthy   Brief/Interim Summary:  Brief Narrative: 64yoM with hx COPD, CHF (EF 25%), Afib (on xarelto), Tobacco absue, Chronic hypoxia (on 3L O2), HTN, DM, Chronic pain, and Noncompliance, presents to the ER c/o SOB and Increasing BLE edema x 4 days, found to be hypoxic on 84% in ER. Pox 98% on BIPAP. In the ER patient found to be hypoxic and CXR showed pulmonary edema. He was started on BIPAP 16/6 with ABG after on BIPAP showing acute-on-chronic hypercapnea (7.27/73/83/34). IPAP increased from 16 to 18, EPAP kept at 6. No lasix or nebs given yet. PCCM then consulted. When I arrived in ER patient is being prepared to leave room. RN says patient going to Head CT for AMS then admitted to [facility]. I halted the transfer, examined patient, then discussed with hospitalist. It seems earlier tonight he was more somnolent and only aroused to sternal rub. At time of my exam however, he arouses to voice and obeys commands, afocal neuro exam. Will hold off on Head CT for now. Will admit patient to ICU since he is on BIPAP and high risk of decompensating and requiring intubation. Discussed plan of care with patient's wife at bedside.     Assessment & Plan:   Principal Problem:   Acute respiratory failure with hypoxia and hypercapnia (HCC) Active Problems:   Type 2 diabetes mellitus with neuropathy   PAF (paroxysmal atrial fibrillation) (HCC)   Nonischemic cardiomyopathy (HCC)   CAD - Non-obstructive by AST4/19   Chronic systolic CHF (congestive  heart failure) (HCC)   COPD (chronic obstructive pulmonary disease) (HCC)   OSA (obstructive sleep apnea)   Pulmonary hypertension (HCC)   Acute exacerbation of CHF (congestive heart failure) (HCC)   1-Acute-on-Chronic Hypercapnic and Hypoxic Respiratory failure; Pulmonary edema; CHF exacerbation,Progressing Pulmonary HTN: Likely OSA, needs CPAP. CM consulted.  Required BIPAP on admission.  Off BIPAP.  CPAP at night. Refuse.  On IV lasix. Heart failure team following.  Doxy to cover for PNA> Weight down to 272. -263. Discharge today on torsemide 60 mg BID.  Needs sleep study and CPAP/   Acute systolic HF exacerbation.  On lisinopril. Spironolactone.  HF team following.  Negative 9 L.  Weight; 260---283--284--285 --278. --272--263 Weight on last hospitalization day was at 272 Chest x ray; stable vascular congestion, left infiltrate. Effusion. Covering for PNA with doxy Discharge on torsemide 60 mg BID  COPD;  Continue with nebulizer.   A fib; Continue with xarelto.  Underwent cardioversion 9-27  DM;  SSI.  Resume 7/3 lowe dose 5 BID  AKI, hyperkalemia; resolved.   Chronic pain management. Percocet PR.  Tolerated percocet 10 mg  Anemia; monitor hb.  Iron 23, ferritin 16.  Continue  iron supplement.   Discharge Diagnoses:  Principal Problem:   Acute respiratory failure with hypoxia and hypercapnia (HCC) Active Problems:   Type 2 diabetes mellitus with neuropathy   PAF (paroxysmal atrial fibrillation) (HCC)   Nonischemic cardiomyopathy (HCC)   CAD - Non-obstructive by QQI2/97   Chronic systolic CHF (congestive heart failure) ([place])  COPD (chronic obstructive pulmonary disease) (HCC)   OSA (obstructive sleep apnea)   Pulmonary hypertension (HCC)   Acute exacerbation of CHF (congestive heart failure) [facility])    Discharge Instructions  Discharge Instructions    Diet - low sodium heart healthy   Complete by:  As directed    Increase activity slowly    Complete by:  As directed      Allergies as of 08/02/2018   No Known Allergies     Medication List    STOP taking these medications   ipratropium 0.02 % nebulizer solution Commonly known as:  ATROVENT     TAKE these medications   albuterol 108 (90 Base) MCG/ACT inhaler Commonly known as:  PROVENTIL HFA;VENTOLIN HFA Inhale 2 puffs into the lungs every 6 (six) hours as needed for wheezing or shortness of breath.   budesonide-formoterol 160-4.5 MCG/ACT inhaler Commonly known as:  SYMBICORT Inhale 2 puffs into the lungs 2 (two) times daily.   carvedilol 6.25 MG tablet Commonly known as:  COREG Take 1 tablet (6.25 mg total) by mouth 2 (two) times daily with a meal. What changed:    medication strength  how much to take   diazepam 10 MG tablet Commonly known as:  VALIUM Take 0.5 tablets (5 mg total) by mouth daily as needed for anxiety or sleep. What changed:  how much to take   doxycycline 100 MG tablet Commonly known as:  VIBRA-TABS Take 1 tablet (100 mg total) by mouth every 12 (twelve) hours.   ferrous sulfate 325 (65 FE) MG tablet Take 325 mg by mouth 3 (three) times daily as needed (for supplementation).   fluticasone 50 MCG/ACT nasal spray Commonly known as:  FLONASE Place 2 sprays into both nostrils daily as needed for allergies.   folic acid 1 MG tablet Commonly known as:  FOLVITE Take 1 mg by mouth daily.   gabapentin 600 MG tablet Commonly known as:  NEURONTIN Take 1 tablet (600 mg total) by mouth 2 (two) times daily.   glucose 4 GM chewable tablet Chew 4 tablets by mouth See admin instructions. Chew 4 tablets by mouth as needed for low blood sugar - repeat every 15 minutes if blood sugar less than 70   guaifenesin 400 MG Tabs tablet Commonly known as:  HUMIBID E Take 1 tablet (400 mg total) by mouth every 6 (six) hours as needed. What changed:    when to take this  reasons to take this   hydrocortisone 1 % lotion Apply 1 application topically  See admin instructions. Apply small amount to back twice daily for itchy rash   hydrocortisone 2.5 % rectal cream Commonly known as:  ANUSOL-HC Place 1 application rectally 2 (two) times daily as needed for hemorrhoids or itching. Use up to 2 weeks at a time as needed   insulin aspart protamine - aspart (70-30) 100 UNIT/ML FlexPen Commonly known as:  NOVOLOG 70/30 MIX Inject 0.05 mLs (5 Units total) into the skin 2 (two) times daily. What changed:  how much to take   ipratropium-albuterol 0.5-2.5 (3) MG/3ML Soln Commonly known as:  DUONEB Take 3 mLs by nebulization 2 (two) times daily.   latanoprost 0.005 % ophthalmic solution Commonly known as:  XALATAN Place 1 drop into both eyes at bedtime.   lisinopril 20 MG tablet Commonly known as:  PRINIVIL,ZESTRIL Take 1 tablet (20 mg total) by mouth daily.   Magnesium Oxide 420 (252 Mg) MG Tabs Take 420 mg by mouth 2 (two) times daily.  metFORMIN 500 MG 24 hr tablet Commonly known as:  GLUCOPHAGE-XR Take 1,000 mg by mouth 2 (two) times daily with a meal.   oxyCODONE-acetaminophen 10-325 MG tablet Commonly known as:  PERCOCET Take 1 tablet by mouth every 6 (six) hours as needed for pain.   OXYGEN Inhale 3 L into the lungs continuous.   potassium chloride SA 20 MEQ tablet Commonly known as:  K-DUR,KLOR-CON Take 2 tablets (40 mEq total) by mouth daily.   rivaroxaban 20 MG Tabs tablet Commonly known as:  XARELTO Take 1 tablet (20 mg total) by mouth daily with supper. What changed:  when to take this   rosuvastatin 20 MG tablet Commonly known as:  CRESTOR Take 1 tablet (20 mg total) by mouth daily.   spironolactone 25 MG tablet Commonly known as:  ALDACTONE Take 1 tablet (25 mg total) by mouth at bedtime.   torsemide 20 MG tablet Commonly known as:  DEMADEX Take 3 tablets (60 mg total) by mouth 2 (two) times daily. What changed:  how much to take      Follow-up Information    Martyn Ehrich, NP. Go on 08/10/2018.    Specialty:  Pulmonary Disease Why:  10:30 appointment Arrive 15 minutes early Contact information: [address] [place] [address] [phone]        [place] On 08/10/2018.   Specialty:  Cardiology Why:  Heart Failure [facility] Followup at 3:00 pm.  Garage Code: [id number]  Take all meds day of appointment as prescribed, bring all med bottles/lists. Contact information: [address] [id number] [place] [phone]         No Known Allergies  Consultations:  Cardiology    Procedures/Studies: Dg Chest Port 1 View  Result Date: 07/28/2018 CLINICAL DATA:  Respiratory failure EXAM: PORTABLE CHEST 1 VIEW COMPARISON:  07/27/2018 FINDINGS: Cardiac shadow remains enlarged. Aortic calcifications are again seen. Vascular congestion is again noted. Left basilar infiltrate with associated effusion is again seen. No bony abnormality is noted. IMPRESSION: Stable vascular congestion. Stable left basilar infiltrate with associated effusion. Electronically Signed   By: Inez Catalina M.D.   On: 07/28/2018 11:09   Dg Chest Port 1 View  Result Date: 07/27/2018 CLINICAL DATA:  Shortness of breath.  Pulmonary edema EXAM: PORTABLE CHEST 1 VIEW COMPARISON:  Chest radiograph 07/25/2018 FINDINGS: Stable cardiomegaly. Diffuse bilateral interstitial opacities. Small left pleural effusion with underlying opacities. IMPRESSION: Cardiomegaly and interstitial edema, somewhat worsened from prior. Small left pleural effusion and underlying opacities. Electronically Signed   By: Lovey Newcomer M.D.   On: 07/27/2018 10:22   Dg Chest Port 1 View  Result Date: 07/25/2018 CLINICAL DATA:  Dyspnea EXAM: PORTABLE CHEST 1 VIEW COMPARISON:  05/02/2018 chest radiograph. FINDINGS: Stable cardiomediastinal silhouette with moderate cardiomegaly. No pneumothorax. Small bilateral pleural effusions, left greater than right.  Mild pulmonary edema. Bibasilar hazy opacity. IMPRESSION: 1. Moderate cardiomegaly with mild pulmonary edema and small bilateral pleural effusions, left greater than right, compatible with mild congestive heart failure. 2. Hazy bibasilar lung opacities, favor atelectasis. Electronically Signed   By: Ilona Sorrel M.D.   On: 07/25/2018 22:06     Subjective: Feeling better, ready to go home  Discharge Exam: Vitals:   08/02/18 0845 08/02/18 0846  BP:    Pulse:    Resp:    Temp:    SpO2: 97% 97%   Vitals:   08/02/18 0144 08/02/18 0636 08/02/18 0845  08/02/18 0846  BP:  (!) 104/58    Pulse:  67    Resp:  19    Temp:  98.4 F (36.9 C)    TempSrc:  Oral    SpO2:  96% 97% 97%  Weight: 121.6 kg     Height:        General: Pt is alert, awake, not in acute distress Cardiovascular: RRR, S1/S2 +, no rubs, no gallops Respiratory: CTA bilaterally, no wheezing, no rhonchi Abdominal: Soft, NT, ND, bowel sounds + Extremities: no edema, no cyanosis    The results of significant diagnostics from this hospitalization (including imaging, microbiology, ancillary and laboratory) are listed below for reference.     Microbiology: Recent Results (from the past 240 hour(s))  MRSA PCR Screening     Status: None   Collection Time: 07/26/18  4:04 AM  Result Value Ref Range Status   MRSA by PCR NEGATIVE NEGATIVE Final    Comment:        The GeneXpert MRSA Assay (FDA approved for NASAL specimens only), is one component of a comprehensive MRSA colonization surveillance program. It is not intended to diagnose MRSA infection nor to guide or monitor treatment for MRSA infections. Performed at [facility], [place] [address] [place], [place] [address]      Labs: BNP (last 3 results) Recent Labs    04/30/18 2049 07/25/18 2159 07/28/18 0501  BNP 1,912.5* 2,064.1* 6,979.4*   Basic Metabolic Panel: Recent Labs  Lab 07/28/18 0501 07/29/18 0556 07/30/18 0518 07/31/18 0552  08/01/18 0513 08/02/18 0649  NA 140 139 138 136 135 134*  K 3.4* 3.5 3.7 3.9 3.8 3.9  CL 94* 94* 93* 90* 91* 91*  CO2 35* 36* 34* 37* 31 33*  GLUCOSE 115* 180* 154* 130* 206* 142*  BUN 26* _0 24* 22  CREATININE 1.15 1.03 1.05 1.04 1.06 0.94  CALCIUM 8.4* 8.3* 8.4* 8.7* 8.6* 8.8*  MG 1.7 2.0  --   --   --   --    Liver Function Tests: No results for input(s): AST, ALT, ALKPHOS, BILITOT, PROT, ALBUMIN in the last 168 hours. No results for input(s): LIPASE, AMYLASE in the last 168 hours. No results for input(s): AMMONIA in the last 168 hours. CBC: Recent Labs  Lab 07/29/18 0556 07/30/18 0518 07/31/18 0552 08/01/18 0513 08/02/18 0649  WBC 8.0 8.4 8.7 9.4 9.3  HGB 8.9* 9.0* 9.0* 9.7* 9.5*  HCT 30.4* 31.0* 31.3* 32.8* 32.0*  MCV 85.4 85.6 85.3 84.1 84.4  PLT 382 343 355 408* 393   Cardiac Enzymes: No results for input(s): CKTOTAL, CKMB, CKMBINDEX, TROPONINI in the last 168 hours. BNP: Invalid input(s): POCBNP CBG: Recent Labs  Lab 08/01/18 1146 08/01/18 1557 08/01/18 2110 08/02/18 0819 08/02/18 1108  GLUCAP 147* 204* 190* 151* 215*   D-Dimer No results for input(s): DDIMER in the last 72 hours. Hgb A1c No results for input(s): HGBA1C in the last 72 hours. Lipid Profile No results for input(s): CHOL, HDL, LDLCALC, TRIG, CHOLHDL, LDLDIRECT in the last 72 hours. Thyroid function studies No results for input(s): TSH, T4TOTAL, T3FREE, THYROIDAB in the last 72 hours.  Invalid input(s): FREET3 Anemia work up No results for input(s): VITAMINB12, FOLATE, FERRITIN, TIBC, IRON, RETICCTPCT in the last 72 hours. Urinalysis    Component Value Date/Time   COLORURINE YELLOW 12/13/2016 0420   APPEARANCEUR CLEAR 12/13/2016 0420   LABSPEC 1.010 12/13/2016 0420   PHURINE 5.0 12/13/2016 0420   GLUCOSEU >=500 (A) 12/13/2016  [place] 12/13/2016 0420   BILIRUBINUR NEGATIVE 12/13/2016 0420   KETONESUR NEGATIVE 12/13/2016 0420   PROTEINUR 100 (A) 12/13/2016 0420    UROBILINOGEN 0.2 10/26/2014 2206   NITRITE NEGATIVE 12/13/2016 0420   LEUKOCYTESUR NEGATIVE 12/13/2016 0420   Sepsis Labs Invalid input(s): PROCALCITONIN,  WBC,  LACTICIDVEN Microbiology Recent Results (from the past 240 hour(s))  MRSA PCR Screening     Status: None   Collection Time: 07/26/18  4:04 AM  Result Value Ref Range Status   MRSA by PCR NEGATIVE NEGATIVE Final    Comment:        The GeneXpert MRSA Assay (FDA approved for NASAL specimens only), is one component of a comprehensive MRSA colonization surveillance program. It is not intended to diagnose MRSA infection nor to guide or monitor treatment for MRSA infections. Performed at [facility], [place] [address], [place],  [address]      Time coordinating discharge; 35 minutes.   SIGNED:   Elmarie Shiley, MD  [facility] 08/03/2018, 3:40 PM Pager   If 7PM-7AM, please contact night-coverage [email or web address] Password [email or web address]

## 2018-08-02 NOTE — Consult Note (Addendum)
   [facility] CM Inpatient Consult   08/02/2018  Nicholas Caldwell Sep 11, 1946 [id number]   Reviewed for high risk score and previous history with [facility] services with Medicare.     Met with the patient and wife at bedside regarding the benefits of [facility] Care Management services. Patient does not have a [facility] provider for primary care services.  He states, "I go to the [facility]  Patient is active with HF clinic. Currently receiving blood products and eating.  A brochure left at bedside.  Will follow up as time permits but currently denies any needs. No [facility] Care Management needs noted.  Natividad Brood, RN BSN [facility] Liaison  [phone] business mobile phone Toll free office [phone]

## 2018-08-02 NOTE — Progress Notes (Signed)
Pt has orders to be discharged. Discharge instructions given and pt has no additional questions at this time. Medication regimen reviewed and pt educated. Pt verbalized understanding and has no additional questions. Telemetry box removed. IV removed and site in good condition. Patient refuses home health services. Pt stable and waiting for transportation.

## 2018-08-02 NOTE — Care Management Note (Signed)
Case Management Note  Patient Details  Name: Nicholas Caldwell MRN: [id number] Date of Birth: [birth year]  Subjective/Objective: Pt presented for Acute Respiratory Failure. CSW relayed to CM that patient is declining SNF. CM did visit the patient and spouse in the room at time of visit. CM was asked to leave the room- he stated "he is just trying to leave [name] and does not need anything". CM explained that she was there to set up [name]. Declined to have CM set up any services.           Action/Plan: No further needs from CM at this time.   Expected Discharge Date:  08/02/18               Expected Discharge Plan:  Home/Self Care  In-House Referral:  Clinical Social Work  Discharge planning Services  CM Consult  Post Acute Care Choice:  Home Health Choice offered to:  Patient, Spouse  DME Arranged:  N/A DME Agency:  NA  HH Arranged:  Patient Refused HH, Refused SNF [place] Agency:  NA  Status of Service:  Completed, signed off  If discussed at [place] of Stay Meetings, dates discussed:    Additional Comments:  [name], RN 08/02/2018, 1:23 PM

## 2018-08-05 NOTE — Addendum Note (Signed)
Encounter addended by: Georgiana Shore, NP on: 08/05/2018 10:07 AM  Actions taken: Delete clinical note

## 2018-08-10 ENCOUNTER — Inpatient Hospital Stay: Payer: Medicare Other | Admitting: Primary Care

## 2018-08-10 ENCOUNTER — Encounter (HOSPITAL_COMMUNITY): Payer: Medicare Other

## 2018-08-23 ENCOUNTER — Encounter (HOSPITAL_COMMUNITY): Payer: Medicare Other

## 2018-09-02 ENCOUNTER — Encounter (HOSPITAL_COMMUNITY): Payer: Medicare Other

## 2018-09-16 ENCOUNTER — Encounter (HOSPITAL_COMMUNITY): Payer: Medicare Other

## 2018-10-04 ENCOUNTER — Encounter (HOSPITAL_COMMUNITY): Payer: Medicare Other

## 2018-10-07 ENCOUNTER — Ambulatory Visit (HOSPITAL_COMMUNITY): Payer: Medicare Other | Attending: Internal Medicine

## 2018-10-26 ENCOUNTER — Encounter (HOSPITAL_COMMUNITY): Payer: Self-pay

## 2018-10-26 ENCOUNTER — Other Ambulatory Visit: Payer: Self-pay

## 2018-10-26 ENCOUNTER — Emergency Department (HOSPITAL_COMMUNITY)
Admission: EM | Admit: 2018-10-26 | Discharge: 2018-10-26 | Disposition: A | Discharge status: Home / Self Care | Payer: Medicare Other | Attending: Emergency Medicine | Admitting: Emergency Medicine

## 2018-10-26 ENCOUNTER — Emergency Department (HOSPITAL_COMMUNITY): Payer: Medicare Other

## 2018-10-26 DIAGNOSIS — I2510 Atherosclerotic heart disease of native coronary artery without angina pectoris: Secondary | ICD-10-CM | POA: Diagnosis not present

## 2018-10-26 DIAGNOSIS — Z7984 Long term (current) use of oral hypoglycemic drugs: Secondary | ICD-10-CM | POA: Diagnosis not present

## 2018-10-26 DIAGNOSIS — F17210 Nicotine dependence, cigarettes, uncomplicated: Secondary | ICD-10-CM | POA: Insufficient documentation

## 2018-10-26 DIAGNOSIS — R42 Dizziness and giddiness: Secondary | ICD-10-CM | POA: Diagnosis not present

## 2018-10-26 DIAGNOSIS — R0902 Hypoxemia: Secondary | ICD-10-CM | POA: Diagnosis not present

## 2018-10-26 DIAGNOSIS — R0689 Other abnormalities of breathing: Secondary | ICD-10-CM | POA: Diagnosis not present

## 2018-10-26 DIAGNOSIS — Z79899 Other long term (current) drug therapy: Secondary | ICD-10-CM | POA: Diagnosis not present

## 2018-10-26 DIAGNOSIS — R0602 Shortness of breath: Secondary | ICD-10-CM

## 2018-10-26 DIAGNOSIS — E119 Type 2 diabetes mellitus without complications: Secondary | ICD-10-CM | POA: Insufficient documentation

## 2018-10-26 DIAGNOSIS — J441 Chronic obstructive pulmonary disease with (acute) exacerbation: Secondary | ICD-10-CM | POA: Diagnosis not present

## 2018-10-26 DIAGNOSIS — J069 Acute upper respiratory infection, unspecified: Secondary | ICD-10-CM | POA: Diagnosis not present

## 2018-10-26 DIAGNOSIS — R059 Cough, unspecified: Secondary | ICD-10-CM

## 2018-10-26 DIAGNOSIS — R05 Cough: Secondary | ICD-10-CM | POA: Insufficient documentation

## 2018-10-26 DIAGNOSIS — I509 Heart failure, unspecified: Secondary | ICD-10-CM | POA: Diagnosis not present

## 2018-10-26 LAB — CBC WITH DIFFERENTIAL/PLATELET
ABS IMMATURE GRANULOCYTES: 0.03 10*3/uL (ref 0.00–0.07)
BASOS ABS: 0.1 10*3/uL (ref 0.0–0.1)
Basophils Relative: 1 %
EOS PCT: 2 %
Eosinophils Absolute: 0.2 10*3/uL (ref 0.0–0.5)
HEMATOCRIT: 30.7 % — AB (ref 39.0–52.0)
HEMOGLOBIN: 8.6 g/dL — AB (ref 13.0–17.0)
Immature Granulocytes: 0 %
LYMPHS ABS: 1.3 10*3/uL (ref 0.7–4.0)
LYMPHS PCT: 14 %
MCH: 25.9 pg — ABNORMAL LOW (ref 26.0–34.0)
MCHC: 28 g/dL — AB (ref 30.0–36.0)
MCV: 92.5 fL (ref 80.0–100.0)
MONO ABS: 1 10*3/uL (ref 0.1–1.0)
Monocytes Relative: 10 %
NEUTROS ABS: 7.3 10*3/uL (ref 1.7–7.7)
NRBC: 0.2 % (ref 0.0–0.2)
Neutrophils Relative %: 73 %
Platelets: 420 10*3/uL — ABNORMAL HIGH (ref 150–400)
RBC: 3.32 MIL/uL — AB (ref 4.22–5.81)
RDW: 18.4 % — ABNORMAL HIGH (ref 11.5–15.5)
WBC: 9.9 10*3/uL (ref 4.0–10.5)

## 2018-10-26 LAB — LIPASE, BLOOD: LIPASE: 26 U/L (ref 11–51)

## 2018-10-26 LAB — COMPREHENSIVE METABOLIC PANEL
ALT: 9 U/L (ref 0–44)
AST: 16 U/L (ref 15–41)
Albumin: 2.8 g/dL — ABNORMAL LOW (ref 3.5–5.0)
Alkaline Phosphatase: 101 U/L (ref 38–126)
Anion gap: 12 (ref 5–15)
BUN: 16 mg/dL (ref 8–23)
CHLORIDE: 93 mmol/L — AB (ref 98–111)
CO2: 35 mmol/L — ABNORMAL HIGH (ref 22–32)
Calcium: 8.5 mg/dL — ABNORMAL LOW (ref 8.9–10.3)
Creatinine, Ser: 0.94 mg/dL (ref 0.61–1.24)
GFR calc Af Amer: >60 mL/min (ref >60)
GFR calc non Af Amer: >60 mL/min (ref >60)
Glucose, Bld: 56 mg/dL — ABNORMAL LOW (ref 70–99)
Potassium: 4.2 mmol/L (ref 3.5–5.1)
Sodium: 140 mmol/L (ref 135–145)
Total Bilirubin: 0.5 mg/dL (ref 0.3–1.2)
Total Protein: 7.5 g/dL (ref 6.5–8.1)

## 2018-10-26 LAB — I-STAT TROPONIN, ED: Troponin i, poc: 0.02 ng/mL (ref 0.00–0.08)

## 2018-10-26 LAB — BRAIN NATRIURETIC PEPTIDE: B Natriuretic Peptide: 1279.7 pg/mL — ABNORMAL HIGH (ref 0.0–100.0)

## 2018-10-26 MED ORDER — AZITHROMYCIN 250 MG PO TABS: 250.0000 mg | ORAL_TABLET | Freq: Every day | ORAL | 0 refills | Status: AC

## 2018-10-26 MED ORDER — PREDNISONE 50 MG PO TABS: 50.0000 mg | ORAL_TABLET | Freq: Every day | ORAL | 0 refills | Status: AC

## 2018-10-26 MED ORDER — AZITHROMYCIN 250 MG PO TABS
500.0000 mg | ORAL_TABLET | Freq: Once | ORAL | Status: AC
  Administered 2018-10-26: 500 mg via ORAL
  Filled 2018-10-26: qty 2

## 2018-10-26 MED ORDER — IPRATROPIUM-ALBUTEROL 0.5-2.5 (3) MG/3ML IN SOLN
3.0000 mL | Freq: Once | RESPIRATORY_TRACT | Status: AC
  Administered 2018-10-26: 3 mL via RESPIRATORY_TRACT
  Filled 2018-10-26: qty 3

## 2018-10-26 MED ORDER — METHYLPREDNISOLONE SODIUM SUCC 125 MG IJ SOLR
125.0000 mg | Freq: Once | INTRAMUSCULAR | Status: AC
  Administered 2018-10-26: 125 mg via INTRAVENOUS
  Filled 2018-10-26: qty 2

## 2018-10-26 NOTE — Discharge Instructions (Signed)
Your work-up today showed evidence of COPD exacerbation likely prompted by viral upper respiratory infection and mild fluid overload.  The test looking at your fluid overload was improved from last time.  Your x-ray did not show pneumonia.  After I spoke with pharmacy and our discussion, we agreed that given your history of COPD exacerbations and infections, you will be treated with antibiotics.  I discussed with pharmacy the best one given your history of QT prolongation on her EKG and they recommended azithromycin.  You received the double dose of the "Z-Pak" here, please take 1 dose for the next 4 days starting tomorrow.  Please also use steroid starting tomorrow for the next 4 days.  Please use your inhaler at home and rest.  Please continue your fluid pills.  Clinically we offered admission for COPD exacerbation as you were on BiPAP for a period of time however based on your wishes, you would like to go home.  After our shared decision-making conversation, we agreed.  Please come to the nearest emergency department if any symptoms change or worsen and please follow-up as we discussed.

## 2018-10-26 NOTE — ED Notes (Signed)
Patient verbalizes understanding of discharge instructions. Opportunity for questioning and answers were provided. Armband removed by staff, pt discharged from ED ambulatory w/ family

## 2018-10-26 NOTE — ED Triage Notes (Signed)
Pt BIB [facility] for eval of shortness of breath gradually worsening x 1 week. Pt also endorses ongoing dizziness and weakness over same time frame. Pt was intially 85% on RA on EMS arrival, increased to 100% after being placed on CPAP. Pt has hx of CHR was admitted in September for same, requiring BiPAP at that time as well. Pt appears edematous w/ distended abd and peripheral edema.

## 2018-10-26 NOTE — ED Notes (Signed)
Pt removed from BiPAP per MD Tegeler verbal request. Placed on Home O2 of 3LPM.

## 2018-10-26 NOTE — ED Provider Notes (Signed)
[facility] EMERGENCY DEPARTMENT Provider Note   CSN: [id number] Arrival date & time: 10/26/18  [place]     History   Chief Complaint Chief Complaint  Patient presents with  . Shortness of Breath    HPI Abdo Denault is a 72 y.o. male.  The history is provided by the patient, a relative and medical records. No language interpreter was used.  Shortness of Breath  This is a recurrent problem. The average episode lasts 7 days. The problem occurs continuously.The current episode started more than 2 days ago. The problem has been gradually worsening. Associated symptoms include rhinorrhea, cough, sputum production, wheezing and leg swelling. Pertinent negatives include no fever, no headaches, no neck pain, no hemoptysis, no chest pain, no syncope, no vomiting, no abdominal pain, no rash and no leg pain. He has tried beta-agonist inhalers for the symptoms. The treatment provided no relief. He has had prior hospitalizations. He has had prior ED visits. Associated medical issues include COPD, chronic lung disease, CAD and heart failure.    Past Medical History:  Diagnosis Date  . Atrial flutter ([place])    a. recurrent AFlutter with RVR;  b. Amiodarone Rx started 4/16  . CAD (coronary artery disease)    a. LHC 1/16:  mLAD diffuse disease, pLCx mild disease, dLCx with disease but too small for PCI, RCA ok, EF 25-30%  . Chronic pain   . Chronic systolic CHF (congestive heart failure) ([place])   . COPD (chronic obstructive pulmonary disease) ([place])   . Diabetes mellitus without complication ([place])   . Hypercholesteremia   . Hypertension   . NICM (nonischemic cardiomyopathy) ([place])    a.dx 2016. b. 2D echo 06/2016 - Last echo 07/01/16: mod dilated LV, mod LVH, EF 25-30%, mild-mod MR, sev LAE, mild-mod reduced RV systolic function, mild-mod TR, PASP 29mHG.  .Marland KitchenPAF (paroxysmal atrial fibrillation) (HCC)    On amio - ot a candidate for flecainide due to cardiomyopathy, not a candidate for  Tikosyn due to prolonged QT, and felt to be a poor candidate for ablation given left atrial size.  . Pulmonary hypertension ([place]   . Tobacco abuse     Patient Active Problem List   Diagnosis Date Noted  . Acute respiratory failure with hypoxia and hypercapnia ([place] 07/26/2018  . Acute exacerbation of CHF (congestive heart failure) ([place] City 07/26/2018  . CHF exacerbation ([place] 05/01/2018  . CHF (congestive heart failure) ([place] 04/30/2018  . Chronic respiratory failure with hypoxia (HM[place] River 12/28/2017  . Atrial fibrillation with RVR (HR[place]   . SVT (supraventricular tachycardia) (HEast [place]se[place]   . Torsades de pointes (HBu[place]   . COPD GOLD 0   . Medically noncompliant   . Panlobular emphysema (HS[place]ol   . OSA (obstructive sleep apnea)   . Pulmonary hypertension (HRachel   . Disorientation   . Pressure injury of skin 09/07/2016  . Skin lesion-left heal 09/05/2016  . Chronic systolic CHF (congestive heart failure) (H[place] 07/16/2016  . COPD (chronic obstructive pulmonary disease) (HO[place]i 07/16/2016  . Uncontrolled type 2 diabetes mellitus with complication (H[place]   . Diabetic polyneuropathy associated with diabetes mellitus due to underlying condition ([place]itou Beach[place]   . Normocytic anemia 06/29/2016  . CAD - Non-obstructive by LHC1/16 01/21/2016  . Nonischemic cardiomyopathy (H[place] 10/09/2015  . PAF (paroxysmal atrial fibrillation) (H[place]   . Chronic pain 02/19/2015  . Morbid obesity (HLatt[place] 02/13/2015  . DOE (dyspnea on exertion)   . Elevated troponin I level 11/01/2014  . COPD exacerbation (HWilliamson 11/01/2014  .  Essential hypertension 10/31/2014  . Type 2 diabetes mellitus with neuropathy 10/31/2014  . Hyperlipidemia  10/31/2014  . Cigarette smoker 10/31/2014    Past Surgical History:  Procedure Laterality Date  . CARDIOVERSION N/A 07/30/2018   Procedure: CARDIOVERSION;  Surgeon: Larey Dresser, MD;  Location: [facility] ENDOSCOPY;  Service: Cardiovascular;  Laterality: N/A;  . LEFT AND RIGHT HEART CATHETERIZATION WITH  CORONARY ANGIOGRAM N/A 11/06/2014   Procedure: LEFT AND RIGHT HEART CATHETERIZATION WITH CORONARY ANGIOGRAM;  Surgeon: Jettie Booze, MD;  Location[facility] C[facility] LAB;  Service: Cardiovascular;  Laterality: N/A;  . SPLENECTOMY          Home Medications    Prior to Admission medications   Medication Sig Start Date End Date Taking? Authorizing Provider  albuterol (PROVENTIL HFA;VENTOLIN HFA) 108 (90 BASE) MCG/ACT inhaler Inhale 2 puffs into the lungs every 6 (six) hours as needed for wheezing or shortness of breath. 11/07/14   Kinnie Feil, MD  budesonide-formoterol (SYMBICORT) 160-4.5 MCG/ACT inhaler Inhale 2 puffs into the lungs 2 (two) times daily.    [provider]  carvedilol (COREG) 6.25 MG tablet Take 1 tablet (6.25 mg total) by mouth 2 (two) times daily with a meal. 08/02/18   Regalado, Belkys A, MD  diazepam (VALIUM) 10 MG tablet Take 0.5 tablets (5 mg total) by mouth daily as needed for anxiety or sleep. Patient taking differently: Take 10 mg by mouth daily as needed for anxiety or sleep.  12/19/16   Theodis Blaze, MD  doxycycline (VIBRA-TABS) 100 MG tablet Take 1 tablet (100 mg total) by mouth every 12 (twelve) hours. 08/02/18   Regalado, Belkys A, MD  ferrous sulfate 325 (65 FE) MG tablet Take 325 mg by mouth 3 (three) times daily as needed (for supplementation).    [provider]  fluticasone (FLONASE) 50 MCG/ACT nasal spray Place 2 sprays into both nostrils daily as needed for allergies.     [provider]  folic acid (FOLVITE) 1 MG tablet Take 1 mg by mouth daily.    [provider]  gabapentin (NEURONTIN) 600 MG tablet Take 1 tablet (600 mg total) by mouth 2 (two) times daily. 12/19/16   Theodis Blaze, MD  glucose 4 GM chewable tablet Chew 4 tablets by mouth See admin instructions. Chew 4 tablets by mouth as needed for low blood sugar - repeat every 15 minutes if blood sugar less than 70    [provider]  guaifenesin (HUMIBID E)  400 MG TABS tablet Take 1 tablet (400 mg total) by mouth every 6 (six) hours as needed. 08/02/18   Regalado, Belkys A, MD  hydrocortisone (ANUSOL-HC) 2.5 % rectal cream Place 1 application rectally 2 (two) times daily as needed for hemorrhoids or itching. Use up to 2 weeks at a time as needed    [provider]  hydrocortisone 1 % lotion Apply 1 application topically See admin instructions. Apply small amount to back twice daily for itchy rash    [provider]  insulin aspart protamine - aspart (NOVOLOG MIX 70/30 FLEXPEN) (70-30) 100 UNIT/ML FlexPen Inject 0.05 mLs (5 Units total) into the skin 2 (two) times daily. 08/02/18   Regalado, Belkys A, MD  ipratropium-albuterol (DUONEB) 0.5-2.5 (3) MG/3ML SOLN Take 3 mLs by nebulization 2 (two) times daily.    [provider]  latanoprost (XALATAN) 0.005 % ophthalmic solution Place 1 drop into both eyes at bedtime.    [provider]  lisinopril (PRINIVIL,ZESTRIL) 20 MG tablet  Take 1 tablet (20 mg total) by mouth daily. 05/27/18   Georgiana Shore, NP  Magnesium Oxide 420 (252 Mg) MG TABS Take 420 mg by mouth 2 (two) times daily.    [provider]  metFORMIN (GLUCOPHAGE-XR) 500 MG 24 hr tablet Take 1,000 mg by mouth 2 (two) times daily with a meal.    [provider]  oxyCODONE-acetaminophen (PERCOCET) 10-325 MG tablet Take 1 tablet by mouth every 6 (six) hours as needed for pain.    [provider]  OXYGEN Inhale 3 L into the lungs continuous.     [provider]  potassium chloride SA (K-DUR,KLOR-CON) 20 MEQ tablet Take 2 tablets (40 mEq total) by mouth daily. 11/07/14   Kinnie Feil, MD  rivaroxaban (XARELTO) 20 MG TABS tablet Take 1 tablet (20 mg total) by mouth daily with supper. Patient taking differently: Take 20 mg by mouth daily at 6 PM.  01/20/17   Jettie Booze, MD  rosuvastatin (CRESTOR) 20 MG tablet Take 1 tablet (20 mg total) by mouth daily. 05/07/18   Matilde Haymaker, MD   spironolactone (ALDACTONE) 25 MG tablet Take 1 tablet (25 mg total) by mouth at bedtime. 02/04/18 02/04/19  Shirley Friar, PA-C  torsemide (DEMADEX) 20 MG tablet Take 3 tablets (60 mg total) by mouth 2 (two) times daily. 08/02/18   Regalado, Cassie Freer, MD    Family History Family History  Problem Relation Age of Onset  . Heart disease Mother   . Hypertension Mother   . Heart failure Mother   . Heart disease Father   . Hypertension Sister   . Heart attack Neg Hx   . Stroke Neg Hx     Social History Social History   Tobacco Use  . Smoking status: Current Every Day Smoker    Packs/day: 1.00    Years: 34.00    Pack years: 34.00    Types: Cigarettes  . Smokeless tobacco: Never Used  Substance Use Topics  . Alcohol use: No    Alcohol/week: 0.0 standard drinks  . Drug use: No     Allergies   Patient has no known allergies.   Review of Systems Review of Systems  Constitutional: Positive for chills and fatigue. Negative for diaphoresis and fever.  HENT: Positive for congestion and rhinorrhea.   Eyes: Negative for visual disturbance.  Respiratory: Positive for cough, sputum production, chest tightness, shortness of breath and wheezing. Negative for hemoptysis and stridor.   Cardiovascular: Positive for leg swelling. Negative for chest pain, palpitations and syncope.  Gastrointestinal: Negative for abdominal pain, constipation, diarrhea, nausea and vomiting.  Genitourinary: Negative for flank pain.  Musculoskeletal: Negative for back pain, neck pain and neck stiffness.  Skin: Negative for rash and wound.  Neurological: Negative for light-headedness and headaches.  Psychiatric/Behavioral: Negative for agitation.  All other systems reviewed and are negative.    Physical Exam Updated Vital Signs There were no vitals taken for this visit.  Physical Exam Vitals signs and nursing note reviewed.  Constitutional:      General: He is not in acute distress.     Appearance: He is well-developed. He is ill-appearing. He is not toxic-appearing or diaphoretic.     Interventions: He is not intubated. HENT:     Head: Normocephalic and atraumatic.  Eyes:     Conjunctiva/sclera: Conjunctivae normal.  Neck:     Musculoskeletal: Neck supple.  Cardiovascular:     Rate and Rhythm: Normal rate and regular rhythm.  Heart sounds: No murmur.  Pulmonary:     Effort: Tachypnea and respiratory distress present. He is not intubated.     Breath sounds: Rhonchi and rales present. No decreased breath sounds.  Chest:     Chest wall: Tenderness present. No mass, deformity or crepitus.  Abdominal:     Palpations: Abdomen is soft.     Tenderness: There is no abdominal tenderness.  Musculoskeletal:     Right lower leg: He exhibits no tenderness. Edema present.     Left lower leg: He exhibits no tenderness. No edema.  Skin:    General: Skin is warm and dry.     Capillary Refill: Capillary refill takes less than 2 seconds.     Coloration: Skin is not cyanotic.     Findings: No rash.  Neurological:     Mental Status: He is alert.  Psychiatric:        Mood and Affect: Mood normal.      ED Treatments / Results  Labs (all labs ordered are listed, but only abnormal results are displayed) Labs Reviewed  CBC WITH DIFFERENTIAL/PLATELET - Abnormal; Notable for the following components:      Result Value   RBC 3.32 (*)    Hemoglobin 8.6 (*)    HCT 30.7 (*)    MCH 25.9 (*)    MCHC 28.0 (*)    RDW 18.4 (*)    Platelets 420 (*)    All other components within normal limits  COMPREHENSIVE METABOLIC PANEL - Abnormal; Notable for the following components:   Chloride 93 (*)    CO2 35 (*)    Glucose, Bld 56 (*)    Calcium 8.5 (*)    Albumin 2.8 (*)    All other components within normal limits  BRAIN NATRIURETIC PEPTIDE - Abnormal; Notable for the following components:   B Natriuretic Peptide 1,279.7 (*)    All other components within normal limits  LIPASE,  BLOOD  I-STAT TROPONIN, ED  I-STAT TROPONIN, ED    EKG EKG Interpretation  Date/Time:  Tuesday October 26 2018 18:34:00 EST Ventricular Rate:  73 PR Interval:    QRS Duration: 103 QT Interval:  431 QTC Calculation: 475 R Axis:   103 Text Interpretation:  Sinus rhythm Atrial premature complex Probable left atrial enlargement Right axis deviation Nonspecific T abnormalities, lateral leads When compared to prior, no significant changes seen.  No STEMI Confirmed by Antony Blackbird [phone]) on 10/26/2018 6:44:04 PM   Radiology Dg Chest Portable 1 View  Result Date: 10/26/2018 CLINICAL DATA:  Shortness of breath. Cough. Wheezing. Hypoxia. Congestive heart failure. EXAM: PORTABLE CHEST 1 VIEW COMPARISON:  07/28/2018 FINDINGS: Stable cardiomegaly. Pulmonary opacity is again seen in the left lower lung, without significant change. Right lung remains clear. IMPRESSION: Stable pulmonary opacity in left lower lung. Stable cardiomegaly. Electronically Signed   By: Earle Gell M.D.   On: 10/26/2018 19:15    Procedures Procedures (including critical care time)  Medications Ordered in ED Medications  ipratropium-albuterol (DUONEB) 0.5-2.5 (3) MG/3ML nebulizer solution 3 mL (3 mLs Nebulization Given 10/26/18 1855)  methylPREDNISolone sodium succinate (SOLU-MEDROL) 125 mg/2 mL injection 125 mg (125 mg Intravenous Given 10/26/18 1855)  azithromycin (ZITHROMAX) tablet 500 mg (500 mg Oral Given 10/26/18 2225)     Initial Impression / Assessment and Plan / ED Course  I have reviewed the triage vital signs and the nursing notes.  Pertinent labs & imaging results that were available during my care of the patient were reviewed  by me and considered in my medical decision making (see chart for details).     Nicholas Caldwell is a 72 y.o. male with a past medical history significant for paroxysmal atrial fibrillation/flutter, CAD, CHF EF of 25%, diabetes, hypertension, hyperlipidemia, obesity, sleep apnea,  and pulmonary hypertension who presents with 1 week of rhinorrhea, congestion, chills, and productive cough and 1 day of worsened shortness of breath and wheezing.  Patient reports that he says he has had some peripheral edema worsened over the last week.  He is concerned he may have some fluid overload.  He reports that he has had worsened breathing today which she thinks may be due to either fluid or upper respiratory infection.  He does report he had pneumonia in the past.  He says this feels similar.  He is at a productive cough with phlegm.  No hematemesis or hemoptysis.  He denies chest pain but reports some mild tightness when he is coughing and breathing heavily.  He denies abdominal pain, nausea vomiting, diaphoresis, urinary symptoms or other GI symptoms.  EMS brought the patient in on CPAP as he was found to be hypoxic with an oxygen saturation in the low 80s.  Patient was also tachypneic and tachycardic.  Patient had improvement in his oxygen saturations with BiPAP.  On exam, patient has wheezing, crackles, and rhonchi.  Patient's chest was slightly tender to palpation.  No murmur.  Abdomen was nontender.  Legs were slightly edematous which he reports is worse than baseline bilaterally.  They are symmetric.  Symmetric pulses in upper and lower extremities.  Patient breathing heavily.  Based on exam and history I am most concerned between URI causing COPD exacerbation, CHF exacerbation, or pneumonia.  Patient will have portable x-ray and will continue on BiPAP.  He will have work-up to look for a cardiac or pulmonary cause of his symptoms.  Initial troponin was negative.  CBC shows slightly worsened anemia from prior.  Metabolic panel shows normal kidney function and liver function.  Lipase not elevated.  X-ray shows unchanged opacity in the lung with no new pneumonia.  EKG appears similar to prior with no STEMI.   Awaiting BNP, suspect CHF exacerbation versus COPD exacerbation  primarily.  8:18 PM BNP is elevated at over 1200 however this is improved from prior.  Troponin is nonelevated.  Suspect primarily COPD exacerbation with a small amount of contributory CHF given the reported leg swelling.  As patient is still requiring oxygen supplementation with BiPAP, patient will be called for admission to hospitalist service.  Patient was able to be weaned down to his home oxygen of 3 L.  Patient reports that he wants to go home despite our concern that there is a combination of COPD exacerbation, URI, and his mild fluid overload.  Patient's labs showed improved BNP from prior.  Still elevated however.  Anemia present.  Initial troponin negative. Lipase not elevated.  Anabolic panel similar to prior.  Shows no pneumonia.  We advised patient be admitted however he wants to go home.  Patient was able to maintain his oxygen saturations on his home with 3 L without chest pain, chest tightness, or more shortness of breath.  Patient requested antibiotics and steroids for COPD exacerbation which is helped him in the past.  This will be ordered.  Had a shared decision-making conversation with patient and family about this.  Spoke with pharmacy given the patient's history of torsades and QT prolongation for antibiotic recommendations and they recommended azithromycin.  Patient given a dose here.  Patient's QTC was in the 400s today.  Patient understands extremely strict return precautions for any new or worsened symptoms.  He will continue his diuretics at home.  He will follow-up with his PCP in several days and if any symptoms change or worsen return.  Patient and other questions or concerns and was discharged and with improved symptoms.  Final Clinical Impressions(s) / ED Diagnoses   Final diagnoses:  COPD exacerbation ([place])  Cough  Upper respiratory tract infection, unspecified type  Shortness of breath    ED Discharge Orders         Ordered    azithromycin (ZITHROMAX) 250  MG tablet  Daily     10/26/18 2215    predniSONE (DELTASONE) 50 MG tablet  Daily     10/26/18 2215         Clinical Impression: 1. COPD exacerbation ([place])   2. Cough   3. Upper respiratory tract infection, unspecified type   4. Shortness of breath     Disposition: Discharge  Condition: Good  I have discussed the results, Dx and Tx plan with the pt(& family if present). He/she/they expressed understanding and agree(s) with the plan. Discharge instructions discussed at great length. Strict return precautions discussed and pt &/or family have verbalized understanding of the instructions. No further questions at time of discharge.    Discharge Medication List as of 10/26/2018 10:17 PM    START taking these medications   Details  azithromycin (ZITHROMAX) 250 MG tablet Take 1 tablet (250 mg total) by mouth daily for 4 days. Take first 2 tablets together, then 1 every day until finished., Starting Wed 10/27/2018, Until Sun 10/31/2018, Print    predniSONE (DELTASONE) 50 MG tablet Take 1 tablet (50 mg total) by mouth daily for 4 days., Starting Wed 10/27/2018, Until Sun 10/31/2018, Print        Follow Up: Clinic, [place] [address] [phone]     [place] [address] [id number] mc [place]       , Gwenyth Allegra, MD 10/27/18 [phone]

## 2018-10-26 NOTE — ED Notes (Addendum)
Pt refused blood draw.  Nurse notified

## 2018-11-04 ENCOUNTER — Inpatient Hospital Stay (HOSPITAL_COMMUNITY): Admission: RE | Admit: 2018-11-04 | Payer: Medicare Other | Source: Ambulatory Visit

## 2018-11-12 ENCOUNTER — Emergency Department (HOSPITAL_COMMUNITY): Payer: Medicare Other

## 2018-11-12 ENCOUNTER — Inpatient Hospital Stay (HOSPITAL_COMMUNITY)
Admission: EM | Admit: 2018-11-12 | Discharge: 2018-11-16 | DRG: 291 | Disposition: A | Discharge status: Home Health | Payer: Medicare Other | Attending: Internal Medicine | Admitting: Internal Medicine

## 2018-11-12 ENCOUNTER — Other Ambulatory Visit: Payer: Self-pay

## 2018-11-12 DIAGNOSIS — E0842 Diabetes mellitus due to underlying condition with diabetic polyneuropathy: Secondary | ICD-10-CM | POA: Diagnosis present

## 2018-11-12 DIAGNOSIS — R778 Other specified abnormalities of plasma proteins: Secondary | ICD-10-CM

## 2018-11-12 DIAGNOSIS — Z8249 Family history of ischemic heart disease and other diseases of the circulatory system: Secondary | ICD-10-CM

## 2018-11-12 DIAGNOSIS — Z7901 Long term (current) use of anticoagulants: Secondary | ICD-10-CM

## 2018-11-12 DIAGNOSIS — I10 Essential (primary) hypertension: Secondary | ICD-10-CM | POA: Diagnosis not present

## 2018-11-12 DIAGNOSIS — I4892 Unspecified atrial flutter: Secondary | ICD-10-CM | POA: Diagnosis present

## 2018-11-12 DIAGNOSIS — E7800 Pure hypercholesterolemia, unspecified: Secondary | ICD-10-CM | POA: Diagnosis present

## 2018-11-12 DIAGNOSIS — F17210 Nicotine dependence, cigarettes, uncomplicated: Secondary | ICD-10-CM | POA: Diagnosis present

## 2018-11-12 DIAGNOSIS — R7989 Other specified abnormal findings of blood chemistry: Secondary | ICD-10-CM | POA: Diagnosis not present

## 2018-11-12 DIAGNOSIS — G4733 Obstructive sleep apnea (adult) (pediatric): Secondary | ICD-10-CM | POA: Diagnosis present

## 2018-11-12 DIAGNOSIS — E1142 Type 2 diabetes mellitus with diabetic polyneuropathy: Secondary | ICD-10-CM | POA: Diagnosis present

## 2018-11-12 DIAGNOSIS — I2510 Atherosclerotic heart disease of native coronary artery without angina pectoris: Secondary | ICD-10-CM | POA: Diagnosis present

## 2018-11-12 DIAGNOSIS — I480 Paroxysmal atrial fibrillation: Secondary | ICD-10-CM | POA: Diagnosis present

## 2018-11-12 DIAGNOSIS — J9621 Acute and chronic respiratory failure with hypoxia: Secondary | ICD-10-CM | POA: Diagnosis present

## 2018-11-12 DIAGNOSIS — Z6837 Body mass index (BMI) 37.0-37.9, adult: Secondary | ICD-10-CM

## 2018-11-12 DIAGNOSIS — Z9081 Acquired absence of spleen: Secondary | ICD-10-CM

## 2018-11-12 DIAGNOSIS — I272 Pulmonary hypertension, unspecified: Secondary | ICD-10-CM | POA: Diagnosis present

## 2018-11-12 DIAGNOSIS — E872 Acidosis, unspecified: Secondary | ICD-10-CM

## 2018-11-12 DIAGNOSIS — R0602 Shortness of breath: Secondary | ICD-10-CM | POA: Diagnosis not present

## 2018-11-12 DIAGNOSIS — G8929 Other chronic pain: Secondary | ICD-10-CM | POA: Diagnosis present

## 2018-11-12 DIAGNOSIS — D649 Anemia, unspecified: Secondary | ICD-10-CM | POA: Diagnosis present

## 2018-11-12 DIAGNOSIS — E785 Hyperlipidemia, unspecified: Secondary | ICD-10-CM | POA: Diagnosis present

## 2018-11-12 DIAGNOSIS — J189 Pneumonia, unspecified organism: Secondary | ICD-10-CM | POA: Diagnosis present

## 2018-11-12 DIAGNOSIS — I428 Other cardiomyopathies: Secondary | ICD-10-CM | POA: Diagnosis present

## 2018-11-12 DIAGNOSIS — J441 Chronic obstructive pulmonary disease with (acute) exacerbation: Secondary | ICD-10-CM | POA: Diagnosis present

## 2018-11-12 DIAGNOSIS — I959 Hypotension, unspecified: Secondary | ICD-10-CM | POA: Diagnosis not present

## 2018-11-12 DIAGNOSIS — J810 Acute pulmonary edema: Secondary | ICD-10-CM

## 2018-11-12 DIAGNOSIS — J431 Panlobular emphysema: Secondary | ICD-10-CM | POA: Diagnosis present

## 2018-11-12 DIAGNOSIS — I110 Hypertensive heart disease with heart failure: Secondary | ICD-10-CM | POA: Diagnosis not present

## 2018-11-12 DIAGNOSIS — Z9981 Dependence on supplemental oxygen: Secondary | ICD-10-CM

## 2018-11-12 DIAGNOSIS — I509 Heart failure, unspecified: Secondary | ICD-10-CM

## 2018-11-12 DIAGNOSIS — J9622 Acute and chronic respiratory failure with hypercapnia: Secondary | ICD-10-CM | POA: Diagnosis not present

## 2018-11-12 DIAGNOSIS — G9341 Metabolic encephalopathy: Secondary | ICD-10-CM | POA: Diagnosis present

## 2018-11-12 DIAGNOSIS — I5023 Acute on chronic systolic (congestive) heart failure: Secondary | ICD-10-CM | POA: Diagnosis present

## 2018-11-12 DIAGNOSIS — Z794 Long term (current) use of insulin: Secondary | ICD-10-CM

## 2018-11-12 DIAGNOSIS — I4891 Unspecified atrial fibrillation: Secondary | ICD-10-CM | POA: Diagnosis not present

## 2018-11-12 DIAGNOSIS — Z7951 Long term (current) use of inhaled steroids: Secondary | ICD-10-CM

## 2018-11-12 DIAGNOSIS — J811 Chronic pulmonary edema: Secondary | ICD-10-CM

## 2018-11-12 DIAGNOSIS — R0902 Hypoxemia: Secondary | ICD-10-CM | POA: Diagnosis not present

## 2018-11-12 LAB — MAGNESIUM: Magnesium: 2.3 mg/dL (ref 1.7–2.4)

## 2018-11-12 LAB — I-STAT ARTERIAL BLOOD GAS, ED
Acid-Base Excess: 14 mmol/L — ABNORMAL HIGH (ref 0.0–2.0)
Bicarbonate: 44 mmol/L — ABNORMAL HIGH (ref 20.0–28.0)
O2 Saturation: 83 %
PH ART: 7.299 — AB (ref 7.350–7.450)
Patient temperature: 97.5
TCO2: 47 mmol/L — ABNORMAL HIGH (ref 22–32)
pCO2 arterial: 88.9 mmHg (ref 32.0–48.0)
pO2, Arterial: 54 mmHg — ABNORMAL LOW (ref 83.0–108.0)

## 2018-11-12 LAB — BASIC METABOLIC PANEL
Anion gap: 9 (ref 5–15)
BUN: 17 mg/dL (ref 8–23)
CO2: 40 mmol/L — ABNORMAL HIGH (ref 22–32)
Calcium: 8.4 mg/dL — ABNORMAL LOW (ref 8.9–10.3)
Chloride: 93 mmol/L — ABNORMAL LOW (ref 98–111)
Creatinine, Ser: 1.1 mg/dL (ref 0.61–1.24)
GFR calc Af Amer: >60 mL/min (ref >60)
GFR calc non Af Amer: >60 mL/min (ref >60)
Glucose, Bld: 153 mg/dL — ABNORMAL HIGH (ref 70–99)
Potassium: 4.4 mmol/L (ref 3.5–5.1)
Sodium: 142 mmol/L (ref 135–145)

## 2018-11-12 LAB — PROTIME-INR
INR: 1.35
Prothrombin Time: 16.5 seconds — ABNORMAL HIGH (ref 11.4–15.2)

## 2018-11-12 LAB — CBC WITH DIFFERENTIAL/PLATELET
Abs Immature Granulocytes: 0.04 10*3/uL (ref 0.00–0.07)
Basophils Absolute: 0 10*3/uL (ref 0.0–0.1)
Basophils Relative: 0 %
Eosinophils Absolute: 0 10*3/uL (ref 0.0–0.5)
Eosinophils Relative: 0 %
HEMATOCRIT: 34.1 % — AB (ref 39.0–52.0)
HEMOGLOBIN: 9.1 g/dL — AB (ref 13.0–17.0)
Immature Granulocytes: 0 %
LYMPHS ABS: 1.1 10*3/uL (ref 0.7–4.0)
LYMPHS PCT: 11 %
MCH: 25.3 pg — ABNORMAL LOW (ref 26.0–34.0)
MCHC: 26.7 g/dL — ABNORMAL LOW (ref 30.0–36.0)
MCV: 94.7 fL (ref 80.0–100.0)
Monocytes Absolute: 0.8 10*3/uL (ref 0.1–1.0)
Monocytes Relative: 8 %
Neutro Abs: 8.2 10*3/uL — ABNORMAL HIGH (ref 1.7–7.7)
Neutrophils Relative %: 81 %
Platelets: 336 10*3/uL (ref 150–400)
RBC: 3.6 MIL/uL — ABNORMAL LOW (ref 4.22–5.81)
RDW: 17.2 % — ABNORMAL HIGH (ref 11.5–15.5)
WBC: 10.2 10*3/uL (ref 4.0–10.5)
nRBC: 0 % (ref 0.0–0.2)

## 2018-11-12 LAB — BRAIN NATRIURETIC PEPTIDE: B Natriuretic Peptide: 1302.1 pg/mL — ABNORMAL HIGH (ref 0.0–100.0)

## 2018-11-12 LAB — I-STAT TROPONIN, ED: Troponin i, poc: 0.09 ng/mL (ref 0.00–0.08)

## 2018-11-12 MED ORDER — FUROSEMIDE 10 MG/ML IJ SOLN
40.0000 mg | INTRAMUSCULAR | Status: AC
  Administered 2018-11-13: 40 mg via INTRAVENOUS
  Filled 2018-11-12: qty 4

## 2018-11-12 MED ORDER — ASPIRIN 300 MG RE SUPP
300.0000 mg | RECTAL | Status: AC
  Administered 2018-11-13: 300 mg via RECTAL
  Filled 2018-11-12: qty 1

## 2018-11-12 NOTE — ED Triage Notes (Signed)
Patient here for SOB that has been ongoing x1 week along with generalized fatigue/weakness. Normally on 3lpm via [place]. A&O x4.

## 2018-11-12 NOTE — ED Provider Notes (Signed)
[facility] EMERGENCY DEPARTMENT Provider Note   CSN: [id number] Arrival date & time: 11/12/18  2236     History   Chief Complaint Chief Complaint  Patient presents with  . Shortness of Breath    HPI Nicholas Caldwell is a 73 y.o. male.  The history is provided by the patient and the EMS personnel. No language interpreter was used.    Patient is a 73 year old male with a past medical history of a flutter, CAD, CHF, COPD, DM, HTN, HDL, and pulmonary hypertension who presents via EMS for further evaluation of acute on chronic shortness of breath.  Patient is reportedly on 3 L baseline for his CHF but has an SPO2 in the mid 80s on EMS arrival.  Patient was placed on a nonrebreather in route.  Per patient his shortness of breath has been getting progressively worse over the past week but he denies any fevers, change in his chronic cough, chest pain, headache, earache, sore throat, vomiting, diarrhea, dysuria, abdominal pain, focal extremity numbness weakness or tingling, rash, or other acute complaints.  States he is compliant with all his medications and denies any recent changes.  Denies any other alleviating or aggravating factors.  Past Medical History:  Diagnosis Date  . Atrial flutter ([place])    a. recurrent AFlutter with RVR;  b. Amiodarone Rx started 4/16  . CAD (coronary artery disease)    a. LHC 1/16:  mLAD diffuse disease, pLCx mild disease, dLCx with disease but too small for PCI, RCA ok, EF 25-30%  . Chronic pain   . Chronic systolic CHF (congestive heart failure) ([place])   . COPD (chronic obstructive pulmonary disease) ([place])   . Diabetes mellitus without complication ([place])   . Hypercholesteremia   . Hypertension   . NICM (nonischemic cardiomyopathy) ([place])    a.dx 2016. b. 2D echo 06/2016 - Last echo 07/01/16: mod dilated LV, mod LVH, EF 25-30%, mild-mod MR, sev LAE, mild-mod reduced RV systolic function, mild-mod TR, PASP 9mHG.  .Marland KitchenPAF (paroxysmal atrial  fibrillation) (HCC)    On amio - ot a candidate for flecainide due to cardiomyopathy, not a candidate for Tikosyn due to prolonged QT, and felt to be a poor candidate for ablation given left atrial size.  . Pulmonary hypertension (HLake Success   . Tobacco abuse     Patient Active Problem List   Diagnosis Date Noted  . Acute on chronic respiratory failure with hypoxia and hypercapnia ([facility] 11/13/2018  . Metabolic encephalopathy 061/44/3154 . Acute respiratory failure with hypoxia and hypercapnia ([facility] 07/26/2018  . Acute exacerbation of CHF (congestive heart failure) ([facility] 07/26/2018  . CHF exacerbation ([facility] 05/01/2018  . CHF (congestive heart failure) ([facility] 04/30/2018  . Chronic respiratory failure with hypoxia (HV[place] 12/28/2017  . Atrial fibrillation with RVR (HBanc[facility]   . SVT (supraventricular tachycardia) (HR[place]   . Torsades de pointes (HC[facility]   . COPD GOLD 0   . Medically noncompliant   . Panlobular emphysema ([place]   . OSA (obstructive sleep apnea)   . Pulmonary hypertension (HCam[place]   . Disorientation   . Pressure injury of skin 09/07/2016  . Skin lesion-left heal 09/05/2016  . Chronic systolic CHF (congestive heart failure) (HFreder[facility] 07/16/2016  . COPD (chronic obstructive pulmonary disease) ([facility] 07/16/2016  . Uncontrolled type 2 diabetes mellitus with complication (HSikes   . Diabetic polyneuropathy associated with diabetes mellitus due to underlying condition (HDe[facility]   . Normocytic anemia 06/29/2016  . CAD - Non-obstructive by LHC1/16 01/21/2016  .  Nonischemic cardiomyopathy ([place]) 10/09/2015  . PAF (paroxysmal atrial fibrillation) ([place])   . Chronic pain 02/19/2015  . Morbid obesity ([place]) 02/13/2015  . DOE (dyspnea on exertion)   . Elevated troponin I level 11/01/2014  . COPD exacerbation ([place]) 11/01/2014  . Essential hypertension 10/31/2014  . Type 2 diabetes mellitus with neuropathy 10/31/2014  . Hyperlipidemia  10/31/2014  . Cigarette smoker 10/31/2014    Past Surgical History:    Procedure Laterality Date  . CARDIOVERSION N/A 07/30/2018   Procedure: CARDIOVERSION;  Surgeon: Larey Dresser, MD;  Location: [facility] ENDOSCOPY;  Service: Cardiovascular;  Laterality: N/A;  . LEFT AND RIGHT HEART CATHETERIZATION WITH CORONARY ANGIOGRAM N/A 11/06/2014   Procedure: LEFT AND RIGHT HEART CATHETERIZATION WITH CORONARY ANGIOGRAM;  Surgeon: Jettie Booze, MD;  Location: [facility] CATH LAB;  Service: Cardiovascular;  Laterality: N/A;  . SPLENECTOMY          Home Medications    Prior to Admission medications   Medication Sig Start Date End Date Taking? Authorizing Provider  albuterol (PROVENTIL HFA;VENTOLIN HFA) 108 (90 BASE) MCG/ACT inhaler Inhale 2 puffs into the lungs every 6 (six) hours as needed for wheezing or shortness of breath. 11/07/14   Kinnie Feil, MD  budesonide-formoterol (SYMBICORT) 160-4.5 MCG/ACT inhaler Inhale 2 puffs into the lungs 2 (two) times daily.    [provider]  carvedilol (COREG) 6.25 MG tablet Take 1 tablet (6.25 mg total) by mouth 2 (two) times daily with a meal. 08/02/18   Regalado, Belkys A, MD  diazepam (VALIUM) 10 MG tablet Take 0.5 tablets (5 mg total) by mouth daily as needed for anxiety or sleep. Patient taking differently: Take 10 mg by mouth daily as needed for anxiety or sleep.  12/19/16   Theodis Blaze, MD  doxycycline (VIBRA-TABS) 100 MG tablet Take 1 tablet (100 mg total) by mouth every 12 (twelve) hours. Patient not taking: Reported on 10/26/2018 08/02/18   Regalado, Jerald Kief A, MD  ferrous sulfate 325 (65 FE) MG tablet Take 325 mg by mouth 3 (three) times daily as needed (for supplementation).    [provider]  fluticasone (FLONASE) 50 MCG/ACT nasal spray Place 2 sprays into both nostrils daily as needed for allergies.     [provider]  folic acid (FOLVITE) 1 MG tablet Take 1 mg by mouth daily.    [provider]  gabapentin (NEURONTIN) 600 MG tablet Take 1 tablet (600 mg total) by mouth 2 (two) times  daily. 12/19/16   Theodis Blaze, MD  glucose 4 GM chewable tablet Chew 4 tablets by mouth See admin instructions. Chew 4 tablets by mouth as needed for low blood sugar - repeat every 15 minutes if blood sugar less than 70    [provider]  guaifenesin (HUMIBID E) 400 MG TABS tablet Take 1 tablet (400 mg total) by mouth every 6 (six) hours as needed. 08/02/18   Regalado, Belkys A, MD  hydrocortisone (ANUSOL-HC) 2.5 % rectal cream Place 1 application rectally 2 (two) times daily as needed for hemorrhoids or itching. Use up to 2 weeks at a time as needed    [provider]  hydrocortisone 1 % lotion Apply 1 application topically See admin instructions. Apply small amount to back twice daily for itchy rash    [provider]  insulin aspart protamine - aspart (NOVOLOG MIX 70/30 FLEXPEN) (70-30) 100 UNIT/ML FlexPen Inject 0.05 mLs (5 Units total) into the skin 2 (two) times daily. 08/02/18   Regalado,  Belkys A, MD  ipratropium-albuterol (DUONEB) 0.5-2.5 (3) MG/3ML SOLN Take 3 mLs by nebulization 2 (two) times daily.    [provider]  latanoprost (XALATAN) 0.005 % ophthalmic solution Place 1 drop into both eyes at bedtime.    [provider]  lisinopril (PRINIVIL,ZESTRIL) 20 MG tablet Take 1 tablet (20 mg total) by mouth daily. 05/27/18   Georgiana Shore, NP  Magnesium Oxide 420 (252 Mg) MG TABS Take 420 mg by mouth 2 (two) times daily.    [provider]  metFORMIN (GLUCOPHAGE-XR) 500 MG 24 hr tablet Take 1,000 mg by mouth 2 (two) times daily with a meal.    [provider]  oxyCODONE-acetaminophen (PERCOCET) 10-325 MG tablet Take 1 tablet by mouth every 6 (six) hours as needed for pain.    [provider]  OXYGEN Inhale 3 L into the lungs continuous.     [provider]  potassium chloride SA (K-DUR,KLOR-CON) 20 MEQ tablet Take 2 tablets (40 mEq total) by mouth daily. 11/07/14   Kinnie Feil, MD  rivaroxaban (XARELTO) 20  MG TABS tablet Take 1 tablet (20 mg total) by mouth daily with supper. Patient taking differently: Take 20 mg by mouth daily at 6 PM.  01/20/17   Jettie Booze, MD  rosuvastatin (CRESTOR) 20 MG tablet Take 1 tablet (20 mg total) by mouth daily. 05/07/18   Matilde Haymaker, MD  spironolactone (ALDACTONE) 25 MG tablet Take 1 tablet (25 mg total) by mouth at bedtime. 02/04/18 02/04/19  Shirley Friar, PA-C  torsemide (DEMADEX) 20 MG tablet Take 3 tablets (60 mg total) by mouth 2 (two) times daily. 08/02/18   Regalado, Cassie Freer, MD    Family History Family History  Problem Relation Age of Onset  . Heart disease Mother   . Hypertension Mother   . Heart failure Mother   . Heart disease Father   . Hypertension Sister   . Heart attack Neg Hx   . Stroke Neg Hx     Social History Social History   Tobacco Use  . Smoking status: Current Every Day Smoker    Packs/day: 1.00    Years: 34.00    Pack years: 34.00    Types: Cigarettes  . Smokeless tobacco: Never Used  Substance Use Topics  . Alcohol use: No    Alcohol/week: 0.0 standard drinks  . Drug use: No     Allergies   Patient has no known allergies.   Review of Systems Review of Systems  Constitutional: Negative for chills and fever.  HENT: Negative for ear pain and sore throat.   Eyes: Negative for pain and visual disturbance.  Respiratory: Positive for shortness of breath. Negative for cough.   Cardiovascular: Negative for chest pain and palpitations.  Gastrointestinal: Negative for abdominal pain and vomiting.  Genitourinary: Negative for dysuria and hematuria.  Musculoskeletal: Negative for arthralgias and back pain.  Skin: Negative for color change and rash.  Neurological: Negative for seizures and syncope.  All other systems reviewed and are negative.    Physical Exam Updated Vital Signs BP 132/63   Pulse 84   Temp (!) 96.7 F (35.9 C) (Rectal)   Resp 19   Wt 133.5 kg Comment: pt weighed  SpO2 99%   BMI  37.79 kg/m   Physical Exam Vitals signs and nursing note reviewed.  Constitutional:      Appearance: He is well-developed.  HENT:     Head: Normocephalic and atraumatic.  Mouth/Throat:     Mouth: Mucous membranes are moist.  Eyes:     Conjunctiva/sclera: Conjunctivae normal.     Pupils: Pupils are equal, round, and reactive to light.  Neck:     Musculoskeletal: Neck supple.  Cardiovascular:     Rate and Rhythm: Normal rate and regular rhythm.     Heart sounds: No murmur.  Pulmonary:     Effort: Pulmonary effort is normal. No respiratory distress.     Breath sounds: Normal breath sounds.  Abdominal:     Palpations: Abdomen is soft.     Tenderness: There is no abdominal tenderness.  Skin:    General: Skin is warm and dry.  Neurological:     Mental Status: He is alert.      ED Treatments / Results  Labs (all labs ordered are listed, but only abnormal results are displayed) Labs Reviewed  BASIC METABOLIC PANEL - Abnormal; Notable for the following components:      Result Value   Chloride 93 (*)    CO2 40 (*)    Glucose, Bld 153 (*)    Calcium 8.4 (*)    All other components within normal limits  CBC WITH DIFFERENTIAL/PLATELET - Abnormal; Notable for the following components:   RBC 3.60 (*)    Hemoglobin 9.1 (*)    HCT 34.1 (*)    MCH 25.3 (*)    MCHC 26.7 (*)    RDW 17.2 (*)    Neutro Abs 8.2 (*)    All other components within normal limits  BRAIN NATRIURETIC PEPTIDE - Abnormal; Notable for the following components:   B Natriuretic Peptide 1,302.1 (*)    All other components within normal limits  PROTIME-INR - Abnormal; Notable for the following components:   Prothrombin Time 16.5 (*)    All other components within normal limits  I-STAT TROPONIN, ED - Abnormal; Notable for the following components:   Troponin i, poc 0.09 (*)    All other components within normal limits  I-STAT ARTERIAL BLOOD GAS, ED - Abnormal; Notable for the following components:   pH,  Arterial 7.299 (*)    pCO2 arterial 88.9 (*)    pO2, Arterial 54.0 (*)    Bicarbonate 44.0 (*)    TCO2 47 (*)    Acid-Base Excess 14.0 (*)    All other components within normal limits  RESPIRATORY PANEL BY PCR  MAGNESIUM  CBC  CREATININE, SERUM  INFLUENZA PANEL BY PCR (TYPE A & B)  HEMOGLOBIN A1C    EKG None  Radiology Dg Chest Port 1 View  Result Date: 11/12/2018 CLINICAL DATA:  Shortness of breath EXAM: PORTABLE CHEST 1 VIEW COMPARISON:  10/26/2018 FINDINGS: Cardiomegaly with vascular congestion and moderate pulmonary edema. There are small pleural effusions. Aortic atherosclerosis. No pneumothorax. Persistent left lung base opacity. IMPRESSION: Cardiomegaly with vascular congestion and moderate diffuse interstitial opacities suspicious for pulmonary edema. More confluent hazy lung base opacities may reflect atelectasis or superimposed pneumonia. There are small pleural effusions. Electronically Signed   By: Donavan Foil M.D.   On: 11/12/2018 23:10    Procedures Procedures (including critical care time)  Medications Ordered in ED Medications  heparin injection 5,000 Units (has no administration in time range)  insulin aspart (novoLOG) injection 0-20 Units (has no administration in time range)  methylPREDNISolone sodium succinate (SOLU-MEDROL) 125 mg/2 mL injection 60 mg (has no administration in time range)  furosemide (LASIX) injection 40 mg (40 mg Intravenous Given 11/13/18 0002)  aspirin suppository 300 mg (  300 mg Rectal Given 11/13/18 0052)     Initial Impression / Assessment and Plan / ED Course  I have reviewed the triage vital signs and the nursing notes.  Pertinent labs & imaging results that were available during my care of the patient were reviewed by me and considered in my medical decision making (see chart for details).     Patient is a 73 year old male who presents with above-stated history exam for evaluation of acute on chronic respiratory distress.  On  presentation patient is noted to be tachypneic to the mid 20s and hypoxic on his home 3 L in the mid 80s with otherwise stable vital signs.  Patient was immediately placed on a nonrebreather with SPO2 noted to increase to the low 90s.  ECG shows a flutter with a ventricular rate of 93, right axis deviation, prolonged QTC at 552, with no other acute ischemic findings.  Troponin is 0.09.  ABG shows a pH of 7.299, PCO2 of 88.9, bicarb of 44.  Patient does appear to have a component of acute retention overlying chronic hypercarbia.  BMP shows an NA of 142, K4.4, CL 93, CO2 40, glucose 153, creatinine 1.1, AG 9.  CBC shows a WBC count of 10.2, hemoglobin 9.1, platelets 336.  This hemoglobin appears stable compared to prior levels.  BNP is 1302.  Magnesium 2.3.  INR 1.35.  Chest x-ray remarkable for cardiomegaly with bilateral vascular congestion and interstitial pulmonary edema.  I do not see a focal consolidation suggestive of pneumonia.  Patient given IV Lasix in the emergency department and placed on BiPAP for acute hypercarbic respiratory failure and acute CHF exacerbation.  Prior medical records were reviewed and patient was noted to have required intubation for failing BiPAP for similar presentation in February of last year.  I discussed the patient with ICU colleagues who stated they would come and evaluate the patient in the emergency department.  Care of pt assumed by Dr. Tyrone Nine at approximately 2345. Plan is to admit to ICU.   Final Clinical Impressions(s) / ED Diagnoses   Final diagnoses:  Acute pulmonary edema ([place])  Elevated troponin  Acidosis    ED Discharge Orders    None       Hulan Saas, MD 11/13/18 1540    Davonna Belling, MD 11/16/18 0010

## 2018-11-13 ENCOUNTER — Encounter (HOSPITAL_COMMUNITY): Payer: Self-pay

## 2018-11-13 DIAGNOSIS — I5023 Acute on chronic systolic (congestive) heart failure: Secondary | ICD-10-CM | POA: Diagnosis present

## 2018-11-13 DIAGNOSIS — J9622 Acute and chronic respiratory failure with hypercapnia: Secondary | ICD-10-CM

## 2018-11-13 DIAGNOSIS — Z7951 Long term (current) use of inhaled steroids: Secondary | ICD-10-CM | POA: Diagnosis not present

## 2018-11-13 DIAGNOSIS — R0602 Shortness of breath: Secondary | ICD-10-CM | POA: Diagnosis not present

## 2018-11-13 DIAGNOSIS — J810 Acute pulmonary edema: Secondary | ICD-10-CM | POA: Diagnosis not present

## 2018-11-13 DIAGNOSIS — Z9081 Acquired absence of spleen: Secondary | ICD-10-CM | POA: Diagnosis not present

## 2018-11-13 DIAGNOSIS — I4892 Unspecified atrial flutter: Secondary | ICD-10-CM | POA: Diagnosis present

## 2018-11-13 DIAGNOSIS — G4733 Obstructive sleep apnea (adult) (pediatric): Secondary | ICD-10-CM | POA: Diagnosis present

## 2018-11-13 DIAGNOSIS — J9621 Acute and chronic respiratory failure with hypoxia: Secondary | ICD-10-CM | POA: Diagnosis present

## 2018-11-13 DIAGNOSIS — F17210 Nicotine dependence, cigarettes, uncomplicated: Secondary | ICD-10-CM | POA: Diagnosis present

## 2018-11-13 DIAGNOSIS — G9341 Metabolic encephalopathy: Secondary | ICD-10-CM | POA: Diagnosis present

## 2018-11-13 DIAGNOSIS — E0842 Diabetes mellitus due to underlying condition with diabetic polyneuropathy: Secondary | ICD-10-CM

## 2018-11-13 DIAGNOSIS — I272 Pulmonary hypertension, unspecified: Secondary | ICD-10-CM | POA: Diagnosis present

## 2018-11-13 DIAGNOSIS — G8929 Other chronic pain: Secondary | ICD-10-CM | POA: Diagnosis present

## 2018-11-13 DIAGNOSIS — J90 Pleural effusion, not elsewhere classified: Secondary | ICD-10-CM | POA: Diagnosis not present

## 2018-11-13 DIAGNOSIS — Z8249 Family history of ischemic heart disease and other diseases of the circulatory system: Secondary | ICD-10-CM | POA: Diagnosis not present

## 2018-11-13 DIAGNOSIS — E7800 Pure hypercholesterolemia, unspecified: Secondary | ICD-10-CM | POA: Diagnosis present

## 2018-11-13 DIAGNOSIS — I428 Other cardiomyopathies: Secondary | ICD-10-CM | POA: Diagnosis present

## 2018-11-13 DIAGNOSIS — I110 Hypertensive heart disease with heart failure: Secondary | ICD-10-CM | POA: Diagnosis present

## 2018-11-13 DIAGNOSIS — Z794 Long term (current) use of insulin: Secondary | ICD-10-CM | POA: Diagnosis not present

## 2018-11-13 DIAGNOSIS — E785 Hyperlipidemia, unspecified: Secondary | ICD-10-CM | POA: Diagnosis present

## 2018-11-13 DIAGNOSIS — I2510 Atherosclerotic heart disease of native coronary artery without angina pectoris: Secondary | ICD-10-CM | POA: Diagnosis present

## 2018-11-13 DIAGNOSIS — E872 Acidosis: Secondary | ICD-10-CM | POA: Diagnosis present

## 2018-11-13 DIAGNOSIS — J431 Panlobular emphysema: Secondary | ICD-10-CM | POA: Diagnosis present

## 2018-11-13 DIAGNOSIS — J189 Pneumonia, unspecified organism: Secondary | ICD-10-CM | POA: Diagnosis present

## 2018-11-13 DIAGNOSIS — I480 Paroxysmal atrial fibrillation: Secondary | ICD-10-CM | POA: Diagnosis present

## 2018-11-13 DIAGNOSIS — Z7901 Long term (current) use of anticoagulants: Secondary | ICD-10-CM | POA: Diagnosis not present

## 2018-11-13 DIAGNOSIS — E1142 Type 2 diabetes mellitus with diabetic polyneuropathy: Secondary | ICD-10-CM | POA: Diagnosis present

## 2018-11-13 DIAGNOSIS — J811 Chronic pulmonary edema: Secondary | ICD-10-CM | POA: Diagnosis not present

## 2018-11-13 LAB — BLOOD GAS, ARTERIAL
Acid-Base Excess: 13.4 mmol/L — ABNORMAL HIGH (ref 0.0–2.0)
BICARBONATE: 40.6 mmol/L — AB (ref 20.0–28.0)
Delivery systems: POSITIVE
Drawn by: 414221
Expiratory PAP: 8
FIO2: 40
Inspiratory PAP: 14
O2 Saturation: 94.6 %
PATIENT TEMPERATURE: 98.6
PH ART: 7.272 — AB (ref 7.350–7.450)
pCO2 arterial: 90.9 mmHg (ref 32.0–48.0)
pO2, Arterial: 79.3 mmHg — ABNORMAL LOW (ref 83.0–108.0)

## 2018-11-13 LAB — RESPIRATORY PANEL BY PCR

## 2018-11-13 LAB — CBC
HCT: 37 % — ABNORMAL LOW (ref 39.0–52.0)
Hemoglobin: 10.3 g/dL — ABNORMAL LOW (ref 13.0–17.0)
MCH: 25.8 pg — ABNORMAL LOW (ref 26.0–34.0)
MCHC: 27.8 g/dL — ABNORMAL LOW (ref 30.0–36.0)
MCV: 92.7 fL (ref 80.0–100.0)
Platelets: 268 10*3/uL (ref 150–400)
RBC: 3.99 MIL/uL — ABNORMAL LOW (ref 4.22–5.81)
RDW: 17.2 % — ABNORMAL HIGH (ref 11.5–15.5)
WBC: 8.5 10*3/uL (ref 4.0–10.5)
nRBC: 0 % (ref 0.0–0.2)

## 2018-11-13 LAB — GLUCOSE, CAPILLARY
Glucose-Capillary: 106 mg/dL — ABNORMAL HIGH (ref 70–99)
Glucose-Capillary: 92 mg/dL (ref 70–99)
Glucose-Capillary: 119 mg/dL — ABNORMAL HIGH (ref 70–99)
Glucose-Capillary: 159 mg/dL — ABNORMAL HIGH (ref 70–99)
Glucose-Capillary: 163 mg/dL — ABNORMAL HIGH (ref 70–99)
Glucose-Capillary: 209 mg/dL — ABNORMAL HIGH (ref 70–99)

## 2018-11-13 LAB — POCT I-STAT 3, ART BLOOD GAS (G3+)
Acid-Base Excess: 22 mmol/L — ABNORMAL HIGH (ref 0.0–2.0)
BICARBONATE: 51.6 mmol/L — AB (ref 20.0–28.0)
O2 Saturation: 23 %
Patient temperature: 96.7
TCO2: >50 mmol/L — ABNORMAL HIGH (ref 22–32)
pCO2 arterial: 86.8 mmHg (ref 32.0–48.0)
pH, Arterial: 7.378 (ref 7.350–7.450)
pO2, Arterial: 17 mmHg — CL (ref 83.0–108.0)

## 2018-11-13 LAB — BASIC METABOLIC PANEL
Anion gap: 14 (ref 5–15)
BUN: 20 mg/dL (ref 8–23)
CO2: 35 mmol/L — ABNORMAL HIGH (ref 22–32)
Calcium: 8.3 mg/dL — ABNORMAL LOW (ref 8.9–10.3)
Chloride: 91 mmol/L — ABNORMAL LOW (ref 98–111)
Creatinine, Ser: 1.14 mg/dL (ref 0.61–1.24)
GFR calc non Af Amer: >60 mL/min (ref >60)
Glucose, Bld: 191 mg/dL — ABNORMAL HIGH (ref 70–99)
Potassium: 4 mmol/L (ref 3.5–5.1)
Sodium: 140 mmol/L (ref 135–145)

## 2018-11-13 LAB — INFLUENZA PANEL BY PCR (TYPE A & B)
Influenza A By PCR: NEGATIVE
Influenza B By PCR: NEGATIVE

## 2018-11-13 LAB — PROCALCITONIN: Procalcitonin: <0.1 ng/mL

## 2018-11-13 LAB — CREATININE, SERUM
Creatinine, Ser: 1.05 mg/dL (ref 0.61–1.24)
GFR calc non Af Amer: >60 mL/min (ref >60)

## 2018-11-13 LAB — MRSA PCR SCREENING: MRSA by PCR: NEGATIVE

## 2018-11-13 LAB — HEMOGLOBIN A1C
Hgb A1c MFr Bld: 6.5 % — ABNORMAL HIGH (ref 4.8–5.6)
Mean Plasma Glucose: 139.85 mg/dL

## 2018-11-13 MED ORDER — CHLORHEXIDINE GLUCONATE 0.12 % MT SOLN
15.0000 mL | Freq: Two times a day (BID) | OROMUCOSAL | Status: DC
  Administered 2018-11-13 – 2018-11-14 (×2): 15 mL via OROMUCOSAL
  Filled 2018-11-13: qty 15

## 2018-11-13 MED ORDER — ORAL CARE MOUTH RINSE
15.0000 mL | Freq: Two times a day (BID) | OROMUCOSAL | Status: DC
  Administered 2018-11-13 (×2): 15 mL via OROMUCOSAL

## 2018-11-13 MED ORDER — METOPROLOL TARTRATE 5 MG/5ML IV SOLN
5.0000 mg | Freq: Four times a day (QID) | INTRAVENOUS | Status: DC
  Administered 2018-11-13 – 2018-11-14 (×3): 5 mg via INTRAVENOUS
  Filled 2018-11-13 (×2): qty 5

## 2018-11-13 MED ORDER — INSULIN ASPART 100 UNIT/ML ~~LOC~~ SOLN
0.0000 [IU] | SUBCUTANEOUS | Status: DC
  Administered 2018-11-13: 4 [IU] via SUBCUTANEOUS
  Administered 2018-11-13: 7 [IU] via SUBCUTANEOUS
  Administered 2018-11-13 – 2018-11-14 (×2): 4 [IU] via SUBCUTANEOUS
  Administered 2018-11-14: 7 [IU] via SUBCUTANEOUS

## 2018-11-13 MED ORDER — METHYLPREDNISOLONE SODIUM SUCC 125 MG IJ SOLR: 60.0000 mg | Freq: Four times a day (QID) | INTRAMUSCULAR | Status: DC

## 2018-11-13 MED ORDER — FUROSEMIDE 10 MG/ML IJ SOLN
40.0000 mg | Freq: Two times a day (BID) | INTRAMUSCULAR | Status: DC
  Administered 2018-11-13 – 2018-11-14 (×3): 40 mg via INTRAVENOUS
  Filled 2018-11-13 (×3): qty 4

## 2018-11-13 MED ORDER — SODIUM CHLORIDE 0.9 % IV SOLN
1.0000 g | INTRAVENOUS | Status: DC
  Administered 2018-11-13: 1 g via INTRAVENOUS
  Filled 2018-11-13 (×2): qty 10

## 2018-11-13 MED ORDER — SODIUM CHLORIDE 0.9 % IV SOLN
500.0000 mg | INTRAVENOUS | Status: DC
  Administered 2018-11-13 – 2018-11-14 (×2): 500 mg via INTRAVENOUS
  Filled 2018-11-13 (×2): qty 500

## 2018-11-13 MED ORDER — RIVAROXABAN 20 MG PO TABS
20.0000 mg | ORAL_TABLET | Freq: Every day | ORAL | Status: DC
  Administered 2018-11-13 – 2018-11-15 (×3): 20 mg via ORAL
  Filled 2018-11-13 (×4): qty 1

## 2018-11-13 MED ORDER — METHYLPREDNISOLONE SODIUM SUCC 125 MG IJ SOLR
60.0000 mg | Freq: Two times a day (BID) | INTRAMUSCULAR | Status: DC
  Administered 2018-11-13: 60 mg via INTRAVENOUS
  Filled 2018-11-13: qty 2

## 2018-11-13 MED ORDER — METOPROLOL TARTRATE 5 MG/5ML IV SOLN
INTRAVENOUS | Status: AC
  Filled 2018-11-13: qty 5

## 2018-11-13 MED ORDER — OXYCODONE-ACETAMINOPHEN 5-325 MG PO TABS
1.0000 | ORAL_TABLET | Freq: Four times a day (QID) | ORAL | Status: DC | PRN
  Administered 2018-11-13 – 2018-11-16 (×9): 1 via ORAL
  Filled 2018-11-13 (×9): qty 1

## 2018-11-13 MED ORDER — HEPARIN SODIUM (PORCINE) 5000 UNIT/ML IJ SOLN: 5000.0000 [IU] | Freq: Three times a day (TID) | INTRAMUSCULAR | Status: DC

## 2018-11-13 MED ORDER — METHYLPREDNISOLONE SODIUM SUCC 40 MG IJ SOLR
40.0000 mg | Freq: Two times a day (BID) | INTRAMUSCULAR | Status: DC
  Administered 2018-11-13 (×2): 40 mg via INTRAVENOUS
  Filled 2018-11-13 (×2): qty 1

## 2018-11-13 MED ORDER — IPRATROPIUM-ALBUTEROL 0.5-2.5 (3) MG/3ML IN SOLN
3.0000 mL | Freq: Four times a day (QID) | RESPIRATORY_TRACT | Status: DC
  Administered 2018-11-13 – 2018-11-14 (×5): 3 mL via RESPIRATORY_TRACT
  Filled 2018-11-13 (×5): qty 3

## 2018-11-13 MED ORDER — PIPERACILLIN-TAZOBACTAM 3.375 G IVPB 30 MIN
3.3750 g | Freq: Once | INTRAVENOUS | Status: AC
  Administered 2018-11-13: 3.375 g via INTRAVENOUS
  Filled 2018-11-13: qty 50

## 2018-11-13 MED ORDER — PIPERACILLIN-TAZOBACTAM 3.375 G IVPB
3.3750 g | Freq: Three times a day (TID) | INTRAVENOUS | Status: DC
  Administered 2018-11-13: 3.375 g via INTRAVENOUS
  Filled 2018-11-13: qty 50

## 2018-11-13 MED ORDER — ALBUTEROL SULFATE (2.5 MG/3ML) 0.083% IN NEBU
2.5000 mg | INHALATION_SOLUTION | RESPIRATORY_TRACT | Status: DC | PRN
  Administered 2018-11-14 – 2018-11-15 (×3): 2.5 mg via RESPIRATORY_TRACT
  Filled 2018-11-13 (×3): qty 3

## 2018-11-13 MED ORDER — OXYCODONE HCL 5 MG PO TABS
5.0000 mg | ORAL_TABLET | Freq: Four times a day (QID) | ORAL | Status: DC | PRN
  Administered 2018-11-13 – 2018-11-16 (×9): 5 mg via ORAL
  Filled 2018-11-13 (×9): qty 1

## 2018-11-13 NOTE — Progress Notes (Signed)
ANTICOAGULATION CONSULT NOTE - Initial Consult  Pharmacy Consult for Xarelto Indication: atrial fibrillation  No Known Allergies  Patient Measurements: Weight: 294 lb 5 oz (133.5 kg)(pt weighed)  Vital Signs: Temp: 96.7 F (35.9 C) (01/11 0045) Temp Source: Rectal (01/11 0045) BP: 132/63 (01/11 0045) Pulse Rate: 84 (01/11 0045)  Labs: Recent Labs    11/12/18 2249  HGB 9.1*  HCT 34.1*  PLT 336  LABPROT 16.5*  INR 1.35  CREATININE 1.10    Estimated Creatinine Clearance: 90.8 mL/min (by C-G formula based on SCr of 1.1 mg/dL).   Medical History: Past Medical History:  Diagnosis Date  . Atrial flutter ([place])    a. recurrent AFlutter with RVR;  b. Amiodarone Rx started 4/16  . CAD (coronary artery disease)    a. LHC 1/16:  mLAD diffuse disease, pLCx mild disease, dLCx with disease but too small for PCI, RCA ok, EF 25-30%  . Chronic pain   . Chronic systolic CHF (congestive heart failure) ([place])   . COPD (chronic obstructive pulmonary disease) ([place])   . Diabetes mellitus without complication ([place])   . Hypercholesteremia   . Hypertension   . NICM (nonischemic cardiomyopathy) ([place])    a.dx 2016. b. 2D echo 06/2016 - Last echo 07/01/16: mod dilated LV, mod LVH, EF 25-30%, mild-mod MR, sev LAE, mild-mod reduced RV systolic function, mild-mod TR, PASP 58mHG.  .Marland KitchenPAF (paroxysmal atrial fibrillation) (HCC)    On amio - ot a candidate for flecainide due to cardiomyopathy, not a candidate for Tikosyn due to prolonged QT, and felt to be a poor candidate for ablation given left atrial size.  . Pulmonary hypertension ([place]Wh[place]   . Tobacco abuse     Medications:  See electronic med rec  Assessment: 73y.o. M presents with respiratory failure. Pt on Xarelto PTA for afib. Per his wife, pt took Xarelto 1/10 with supper. Pharmacy asked to dose Xarelto. Hgb 9.1 (baseline ~9 over past few months). No bleeding noted.  Goal of Therapy:  Prevention of CVA Monitor platelets by anticoagulation  protocol: Yes   Plan:  Xarelto 230mdaily with supper Will f/u CBC results and pt's clinical condition  CaSherlon HandingPharmD, BCPS Clinical pharmacist  **Pharmacist phone directory can now be found on [email or web address] ([email or web address]).  Listed under MCW[place]1/09/2019,1:30 AM

## 2018-11-13 NOTE — Progress Notes (Addendum)
[facility] Progress Note Patient Name: Nicholas Caldwell DOB: February 21, 1946 MRN: [id number]   Date of Service  11/13/2018  HPI/Events of Note  72/M with chronic respiratory failure, hx of OPD, brought in due to O2 desaturation at home.   ABG on arrival in the ED 7.29/88.9/54  Pt is on BIPAP.  At the time of my video assessment, he is awake and alert, responding appropriately.  eICU Interventions  Acute on chronic hypercapnic respiratory failure secondary to COPD exacerbation, CHF exacerbation.  Continue BIPAP. Continue steroids.  Pt started on Zosyn. Follow up cultures. Can deescalate in the next 24 hours if no obvious infection. Diurese.  Recheck ABG.  Pt is on Rivaroxaban.      Intervention Category Evaluation Type: New Patient Evaluation  Elsie Lincoln 11/13/2018, 1:40 AM   3:01 AM ABG unchanged 7.27/90.9/ 79.3.  Discussed with RT to change the BIPAP settings to 20/8 and repeat ABG at 5am.   3:05 AM Pt has good urine output with condom catheter in place. Pt does have hx of BPH.  Hold off on foley cath insertion for now.

## 2018-11-13 NOTE — Progress Notes (Signed)
Notified CCM patients HR sustained in the 140s.  New order received to administer lopressor 81m every 6 hours prn.  Medication administered.  Will continue to monitor patient.

## 2018-11-13 NOTE — H&P (Addendum)
NAME:  Brice Kossman MRN:  [id number] DOB:  10-24-1946 LOS: 0 ADMISSION DATE:  11/12/2018 DATE OF SERVICE:  11/13/2018  CHIEF COMPLAINT:  Fatigue/generalized weakness  HISTORY & PHYSICAL  History of Present Illness  This 73 y.o. morbidly obese African-American male smoker presented to the [facility] Emergency Department with complaints of progressively worsening fatigue and generalized weakness over the past week. At the time of clinical interview, the patient is on BiPAP. He is lethargic, arousable to noxious stimuli but uncooperative with clinical interview.  Patient did develop chills on the day of presentation.  He has a known history of COPD, for which she is on multiple bronchodilators and long-term oxygen therapy (4 LPM via nasal cannula).  The wife states that he has maintained SPO2 is in the 90s until today, when he demonstrated oxygen desaturation down to 75% prior to presentation to the emergency department.  No sick contacts.  He did get the flu vaccine this year.  REVIEW OF SYSTEMS This patient is critically ill and cannot provide additional history nor review of systems due to mental status/unconsciousness.   Past Medical/Surgical/Social/Family History   Past Medical History:  Diagnosis Date  . Atrial flutter ([place])    a. recurrent AFlutter with RVR;  b. Amiodarone Rx started 4/16  . CAD (coronary artery disease)    a. LHC 1/16:  mLAD diffuse disease, pLCx mild disease, dLCx with disease but too small for PCI, RCA ok, EF 25-30%  . Chronic pain   . Chronic systolic CHF (congestive heart failure) ([place])   . COPD (chronic obstructive pulmonary disease) ([place])   . Diabetes mellitus without complication ([place])   . Hypercholesteremia   . Hypertension   . NICM (nonischemic cardiomyopathy) ([place])    a.dx 2016. b. 2D echo 06/2016 - Last echo 07/01/16: mod dilated LV, mod LVH, EF 25-30%, mild-mod MR, sev LAE, mild-mod reduced RV systolic function, mild-mod TR, PASP  52mHG.  .Marland KitchenPAF (paroxysmal atrial fibrillation) (HCC)    On amio - ot a candidate for flecainide due to cardiomyopathy, not a candidate for Tikosyn due to prolonged QT, and felt to be a poor candidate for ablation given left atrial size.  . Pulmonary hypertension ([place]   . Tobacco abuse     Past Surgical History:  Procedure Laterality Date  . CARDIOVERSION N/A 07/30/2018   Procedure: CARDIOVERSION;  Surgeon: MLarey Dresser MD;  [facility];  Service: Cardiovascular;  Laterality: N/A;  . LEFT AND RIGHT HEART CATHETERIZATION WITH CORONARY ANGIOGRAM N/A 11/06/2014   Procedure: LEFT AND RIGHT HEART CATHETERIZATION WITH CORONARY ANGIOGRAM;  Surgeon: JJettie Booze MD;  [facility];  Service: Cardiovascular;  Laterality: N/A;  . SPLENECTOMY      Social History   Tobacco Use  . Smoking status: Current Every Day Smoker    Packs/day: 1.00    Years: 34.00    Pack years: 34.00    Types: Cigarettes  . Smokeless tobacco: Never Used  Substance Use Topics  . Alcohol use: No    Alcohol/week: 0.0 standard drinks    Family History  Problem Relation Age of Onset  . Heart disease Mother   . Hypertension Mother   . Heart failure Mother   . Heart disease Father   . Hypertension Sister   . Heart attack Neg Hx   . Stroke Neg Hx     Procedures:  N/A   Significant Diagnostic Tests:  N/A   Micro Data:   Results  for orders placed or performed during the hospital encounter of 07/25/18  MRSA PCR Screening     Status: None   Collection Time: 07/26/18  4:04 AM  Result Value Ref Range Status   MRSA by PCR NEGATIVE NEGATIVE Final    Comment:        The GeneXpert MRSA Assay (FDA approved for NASAL specimens only), is one component of a comprehensive MRSA colonization surveillance program. It is not intended to diagnose MRSA infection nor to guide or monitor treatment for MRSA infections. Performed at [facility], [place] [address] [place], [place]  [address]       Antimicrobials:  N/A    Interim history/subjective:  N/A   Objective   BP (!) 107/46   Pulse 77   Resp (!) 23   Wt 133.5 kg Comment: pt weighed  SpO2 92%   BMI 37.79 kg/m     Filed Weights   11/12/18 2257  Weight: 133.5 kg   No intake or output data in the 24 hours ending 11/13/18 0009      Examination: GENERAL: On BiPAP.  Stuporous, morbidly obese. No acute distress. HEAD: normocephalic, atraumatic EYE: PERRLA, EOM intact, no scleral icterus, no pallor. NOSE: nares are patent. No polyps. No exudate.  THROAT/ORAL CAVITY: Normal dentition. No oral thrush. Macroglossia. Mallampati class IV (only hard palate visible) airway. NECK: supple, no thyromegaly, no JVD, no lymphadenopathy. Trachea midline. CHEST/LUNG: symmetric in development and expansion.  Diminished breath sounds in the bases.  Bibasilar crackles. HEART: Regular S1 and S2 without murmur, rub or gallop. ABDOMEN: Old, well-healed midline laparotomy scar. Soft, nontender, nondistended. Normoactive bowel sounds. No rebound. No guarding. No hepatosplenomegaly. EXTREMITIES: Edema: 3+ and pitting. No cyanosis. No clubbing. 2+ DP pulses LYMPHATIC: no cervical/axillary/inguinal lymph nodes appreciated MUSCULOSKELETAL: No point tenderness. No bulk atrophy.  SKIN:  No rash or lesion. NEUROLOGIC: Doll's eyes intact. Corneal reflex intact. Cough/gag intact. Spontaneous respirations intact. Facial symmetry. Babinski absent. No sensory deficit. Motor: 5/5 @ RUE, 5/5 @ LUE, 5/5 @ RLL,  5/5 @ LLL.  DTR: 2+ @ R biceps, 2+ @ L biceps, 2+ @ R patellar,  2+ @ L patellar. No cerebellar signs. Gait was not assessed.   Resolved Hospital Problem list   N/A   Assessment & Plan:   ASSESSMENT/PLAN:  ASSESSMENT (included in the Hospital Problem List)  Principal Problem:   Acute on chronic respiratory failure with hypoxia and hypercapnia (HCC) Active Problems:   CHF exacerbation (HCC)   Metabolic encephalopathy    COPD exacerbation (HCC)   Morbid obesity (HCC)   Nonischemic cardiomyopathy (HCC)   PAF (paroxysmal atrial fibrillation) (HCC)   Diabetic polyneuropathy associated with diabetes mellitus due to underlying condition ([place])   By systems: PULMONARY  Acute hypercapnic on chronic hypoxemic respiratory failure  COPD exacerbation  Bilateral pleural effusions, small DuoNeb Continue BiPAP Repeat ABG SoluMedrol Flu swab   CARDIOVASCULAR  CHF exacerbation  Nonischemic cardiomyopathy, LVEF 25-30%  Paroxysmal atrial fibrillation, on Xarelto Diurese    NEUROLOGIC  Metabolic encephalopathy Monitor mental status. Of note, the patient is also on chronic opiate therapy (Oxycontin). Monitor for opiate withdrawal.   ENDOCRINE  Type 2 diabetes mellitus Accucheks and sliding scale insulin  RENAL  No acute issues Insert Foley catheter for strict I/O   GASTROINTESTINAL  No acute issues  GI PROPHYLAXIS: famotidine   HEMATOLOGIC  Normocytic anemia  DVT PROPHYLAXIS: Xarelto   INFECTIOUS  No acute issues Will start empiric Zosyn for pneumonia with aspiration  coverage. However, low threshold to discontinue. Check procalcitonin.    PLAN/RECOMMENDATIONS   Admit to ICU under my service (Attending: Renee Pain, MD) with the diagnoses highlighted above in the active Hospital Problem List (ASSESSMENT).    My assessment, plan of care, findings, medications, side effects, etc. were discussed with: nurse and patient's wife.   Best practice:  Diet: NPO Pain/Anxiety/Delirium protocol (if indicated): not indicated VAP protocol (if indicated): HOB elevated 30+ deg DVT prophylaxis: Xarelto GI prophylaxis: famotidine Glucose control: sliding scale insulin Mobility/Activity: bed rest for now; advance as tolerated.   Code Status: Full Code Family Communication:  patient's family (wife) Disposition: ICU   Labs   CBC: Recent Labs  Lab 11/12/18 2249  WBC 10.2  NEUTROABS  8.2*  HGB 9.1*  HCT 34.1*  MCV 94.7  PLT 409    Basic Metabolic Panel: Recent Labs  Lab 11/12/18 2249  NA 142  K 4.4  CL 93*  CO2 40*  GLUCOSE 153*  BUN 17  CREATININE 1.10  CALCIUM 8.4*  MG 2.3   GFR: Estimated Creatinine Clearance: 90.8 mL/min (by C-G formula based on SCr of 1.1 mg/dL). Recent Labs  Lab 11/12/18 2249  WBC 10.2    Liver Function Tests: No results for input(s): AST, ALT, ALKPHOS, BILITOT, PROT, ALBUMIN in the last 168 hours. No results for input(s): LIPASE, AMYLASE in the last 168 hours. No results for input(s): AMMONIA in the last 168 hours.  ABG    Component Value Date/Time   PHART 7.299 (L) 11/12/2018 2252   PCO2ART 88.9 (HH) 11/12/2018 2252   PO2ART 54.0 (L) 11/12/2018 2252   HCO3 44.0 (H) 11/12/2018 2252   TCO2 47 (H) 11/12/2018 2252   ACIDBASEDEF 2.0 02/19/2015 1003   O2SAT 83.0 11/12/2018 2252     Coagulation Profile: Recent Labs  Lab 11/12/18 2249  INR 1.35    Cardiac Enzymes: No results for input(s): CKTOTAL, CKMB, CKMBINDEX, TROPONINI in the last 168 hours.  HbA1C: Hgb A1c MFr Bld  Date/Time Value Ref Range Status  05/01/2018 05:10 AM 8.1 (H) 4.8 - 5.6 % Final    Comment:    (NOTE) Pre diabetes:          5.7%-6.4% Diabetes:              >6.4% Glycemic control for   <7.0% adults with diabetes   11/01/2016 08:51 AM 8.3 (H) 4.8 - 5.6 % Final    Comment:    (NOTE)         Pre-diabetes: 5.7 - 6.4         Diabetes: >6.4         Glycemic control for adults with diabetes: <7.0     CBG: No results for input(s): GLUCAP in the last 168 hours.   Past Medical History   Past Medical History:  Diagnosis Date  . Atrial flutter ([place])    a. recurrent AFlutter with RVR;  b. Amiodarone Rx started 4/16  . CAD (coronary artery disease)    a. LHC 1/16:  mLAD diffuse disease, pLCx mild disease, dLCx with disease but too small for PCI, RCA ok, EF 25-30%  . Chronic pain   . Chronic systolic CHF (congestive heart failure) ([place])    . COPD (chronic obstructive pulmonary disease) ([place])   . Diabetes mellitus without complication ([place])   . Hypercholesteremia   . Hypertension   . NICM (nonischemic cardiomyopathy) ([place])    a.dx 2016. b. 2D echo 06/2016 - Last echo 07/01/16: mod dilated  LV, mod LVH, EF 25-30%, mild-mod MR, sev LAE, mild-mod reduced RV systolic function, mild-mod TR, PASP 31mHG.  .Marland KitchenPAF (paroxysmal atrial fibrillation) (HCC)    On amio - ot a candidate for flecainide due to cardiomyopathy, not a candidate for Tikosyn due to prolonged QT, and felt to be a poor candidate for ablation given left atrial size.  . Pulmonary hypertension ([place]   . Tobacco abuse       Surgical History    Past Surgical History:  Procedure Laterality Date  . CARDIOVERSION N/A 07/30/2018   Procedure: CARDIOVERSION;  Surgeon: MLarey Dresser MD;  [facility];  Service: Cardiovascular;  Laterality: N/A;  . LEFT AND RIGHT HEART CATHETERIZATION WITH CORONARY ANGIOGRAM N/A 11/06/2014   Procedure: LEFT AND RIGHT HEART CATHETERIZATION WITH CORONARY ANGIOGRAM;  Surgeon: JJettie Booze MD;  [facility];  Service: Cardiovascular;  Laterality: N/A;  . SPLENECTOMY        Social History   Social History   Socioeconomic History  . Marital status: Married    Spouse name: Not on file  . Number of children: Not on file  . Years of education: Not on file  . Highest education level: Not on file  Occupational History  . Not on file  Social Needs  . Financial resource strain: Not on file  . Food insecurity:    Worry: Not on file    Inability: Not on file  . Transportation needs:    Medical: Not on file    Non-medical: Not on file  Tobacco Use  . Smoking status: Current Every Day Smoker    Packs/day: 1.00    Years: 34.00    Pack years: 34.00    Types: Cigarettes  . Smokeless tobacco: Never Used  Substance and Sexual Activity  . Alcohol use: No    Alcohol/week: 0.0 standard drinks  . Drug use: No  .  Sexual activity: Not on file  Lifestyle  . Physical activity:    Days per week: Not on file    Minutes per session: Not on file  . Stress: Not on file  Relationships  . Social connections:    Talks on phone: Not on file    Gets together: Not on file    Attends religious service: Not on file    Active member of club or organization: Not on file    Attends meetings of clubs or organizations: Not on file    Relationship status: Not on file  Other Topics Concern  . Not on file  Social History Narrative  . Not on file      Family History    Family History  Problem Relation Age of Onset  . Heart disease Mother   . Hypertension Mother   . Heart failure Mother   . Heart disease Father   . Hypertension Sister   . Heart attack Neg Hx   . Stroke Neg Hx    family history includes Heart disease in his father and mother; Heart failure in his mother; Hypertension in his mother and sister. There is no history of Heart attack or Stroke.    Allergies No Known Allergies    Current Medications  Current Facility-Administered Medications:  .  aspirin suppository 300 mg, 300 mg, Rectal, STAT, SHulan Saas MD  Current Outpatient Medications:  .  albuterol (PROVENTIL HFA;VENTOLIN HFA) 108 (90 BASE) MCG/ACT inhaler, Inhale 2 puffs into the lungs every 6 (six) hours as needed for wheezing or shortness of  breath., Disp: 1 Inhaler, Rfl: 2 .  budesonide-formoterol (SYMBICORT) 160-4.5 MCG/ACT inhaler, Inhale 2 puffs into the lungs 2 (two) times daily., Disp: , Rfl:  .  carvedilol (COREG) 6.25 MG tablet, Take 1 tablet (6.25 mg total) by mouth 2 (two) times daily with a meal., Disp: 60 tablet, Rfl: 0 .  diazepam (VALIUM) 10 MG tablet, Take 0.5 tablets (5 mg total) by mouth daily as needed for anxiety or sleep. (Patient taking differently: Take 10 mg by mouth daily as needed for anxiety or sleep. ), Disp: 30 tablet, Rfl: 0 .  doxycycline (VIBRA-TABS) 100 MG tablet, Take 1 tablet (100 mg total) by  mouth every 12 (twelve) hours. (Patient not taking: Reported on 10/26/2018), Disp: 2 tablet, Rfl: 0 .  ferrous sulfate 325 (65 FE) MG tablet, Take 325 mg by mouth 3 (three) times daily as needed (for supplementation)., Disp: , Rfl:  .  fluticasone (FLONASE) 50 MCG/ACT nasal spray, Place 2 sprays into both nostrils daily as needed for allergies. , Disp: , Rfl:  .  folic acid (FOLVITE) 1 MG tablet, Take 1 mg by mouth daily., Disp: , Rfl:  .  gabapentin (NEURONTIN) 600 MG tablet, Take 1 tablet (600 mg total) by mouth 2 (two) times daily., Disp: 60 tablet, Rfl: 0 .  glucose 4 GM chewable tablet, Chew 4 tablets by mouth See admin instructions. Chew 4 tablets by mouth as needed for low blood sugar - repeat every 15 minutes if blood sugar less than 70, Disp: , Rfl:  .  guaifenesin (HUMIBID E) 400 MG TABS tablet, Take 1 tablet (400 mg total) by mouth every 6 (six) hours as needed., Disp: 10 tablet, Rfl: 0 .  hydrocortisone (ANUSOL-HC) 2.5 % rectal cream, Place 1 application rectally 2 (two) times daily as needed for hemorrhoids or itching. Use up to 2 weeks at a time as needed, Disp: , Rfl:  .  hydrocortisone 1 % lotion, Apply 1 application topically See admin instructions. Apply small amount to back twice daily for itchy rash, Disp: , Rfl:  .  insulin aspart protamine - aspart (NOVOLOG MIX 70/30 FLEXPEN) (70-30) 100 UNIT/ML FlexPen, Inject 0.05 mLs (5 Units total) into the skin 2 (two) times daily., Disp: 15 mL, Rfl: 11 .  ipratropium-albuterol (DUONEB) 0.5-2.5 (3) MG/3ML SOLN, Take 3 mLs by nebulization 2 (two) times daily., Disp: , Rfl:  .  latanoprost (XALATAN) 0.005 % ophthalmic solution, Place 1 drop into both eyes at bedtime., Disp: , Rfl:  .  lisinopril (PRINIVIL,ZESTRIL) 20 MG tablet, Take 1 tablet (20 mg total) by mouth daily., Disp: 30 tablet, Rfl: 3 .  Magnesium Oxide 420 (252 Mg) MG TABS, Take 420 mg by mouth 2 (two) times daily., Disp: , Rfl:  .  metFORMIN (GLUCOPHAGE-XR) 500 MG 24 hr tablet,  Take 1,000 mg by mouth 2 (two) times daily with a meal., Disp: , Rfl:  .  oxyCODONE-acetaminophen (PERCOCET) 10-325 MG tablet, Take 1 tablet by mouth every 6 (six) hours as needed for pain., Disp: , Rfl:  .  OXYGEN, Inhale 3 L into the lungs continuous. , Disp: , Rfl:  .  potassium chloride SA (K-DUR,KLOR-CON) 20 MEQ tablet, Take 2 tablets (40 mEq total) by mouth daily., Disp: 30 tablet, Rfl: 0 .  rivaroxaban (XARELTO) 20 MG TABS tablet, Take 1 tablet (20 mg total) by mouth daily with supper. (Patient taking differently: Take 20 mg by mouth daily at 6 PM. ), Disp: 30 tablet, Rfl: 4 .  rosuvastatin (CRESTOR) 20 MG  tablet, Take 1 tablet (20 mg total) by mouth daily., Disp: 30 tablet, Rfl: 0 .  spironolactone (ALDACTONE) 25 MG tablet, Take 1 tablet (25 mg total) by mouth at bedtime., Disp: 30 tablet, Rfl: 11 .  torsemide (DEMADEX) 20 MG tablet, Take 3 tablets (60 mg total) by mouth 2 (two) times daily., Disp: 60 tablet, Rfl: 0   Home Medications  Prior to Admission medications   Medication Sig Start Date End Date Taking? Authorizing Provider  albuterol (PROVENTIL HFA;VENTOLIN HFA) 108 (90 BASE) MCG/ACT inhaler Inhale 2 puffs into the lungs every 6 (six) hours as needed for wheezing or shortness of breath. 11/07/14   Kinnie Feil, MD  budesonide-formoterol (SYMBICORT) 160-4.5 MCG/ACT inhaler Inhale 2 puffs into the lungs 2 (two) times daily.    [provider]  carvedilol (COREG) 6.25 MG tablet Take 1 tablet (6.25 mg total) by mouth 2 (two) times daily with a meal. 08/02/18   Regalado, Belkys A, MD  diazepam (VALIUM) 10 MG tablet Take 0.5 tablets (5 mg total) by mouth daily as needed for anxiety or sleep. Patient taking differently: Take 10 mg by mouth daily as needed for anxiety or sleep.  12/19/16   Theodis Blaze, MD  doxycycline (VIBRA-TABS) 100 MG tablet Take 1 tablet (100 mg total) by mouth every 12 (twelve) hours. Patient not taking: Reported on 10/26/2018 08/02/18   Regalado, Jerald Kief  A, MD  ferrous sulfate 325 (65 FE) MG tablet Take 325 mg by mouth 3 (three) times daily as needed (for supplementation).    [provider]  fluticasone (FLONASE) 50 MCG/ACT nasal spray Place 2 sprays into both nostrils daily as needed for allergies.     [provider]  folic acid (FOLVITE) 1 MG tablet Take 1 mg by mouth daily.    [provider]  gabapentin (NEURONTIN) 600 MG tablet Take 1 tablet (600 mg total) by mouth 2 (two) times daily. 12/19/16   Theodis Blaze, MD  glucose 4 GM chewable tablet Chew 4 tablets by mouth See admin instructions. Chew 4 tablets by mouth as needed for low blood sugar - repeat every 15 minutes if blood sugar less than 70    [provider]  guaifenesin (HUMIBID E) 400 MG TABS tablet Take 1 tablet (400 mg total) by mouth every 6 (six) hours as needed. 08/02/18   Regalado, Belkys A, MD  hydrocortisone (ANUSOL-HC) 2.5 % rectal cream Place 1 application rectally 2 (two) times daily as needed for hemorrhoids or itching. Use up to 2 weeks at a time as needed    [provider]  hydrocortisone 1 % lotion Apply 1 application topically See admin instructions. Apply small amount to back twice daily for itchy rash    [provider]  insulin aspart protamine - aspart (NOVOLOG MIX 70/30 FLEXPEN) (70-30) 100 UNIT/ML FlexPen Inject 0.05 mLs (5 Units total) into the skin 2 (two) times daily. 08/02/18   Regalado, Belkys A, MD  ipratropium-albuterol (DUONEB) 0.5-2.5 (3) MG/3ML SOLN Take 3 mLs by nebulization 2 (two) times daily.    [provider]  latanoprost (XALATAN) 0.005 % ophthalmic solution Place 1 drop into both eyes at bedtime.    [provider]  lisinopril (PRINIVIL,ZESTRIL) 20 MG tablet Take 1 tablet (20 mg total) by mouth daily. 05/27/18   Georgiana Shore, NP  Magnesium Oxide 420 (252 Mg) MG TABS Take 420 mg by mouth 2 (two) times daily.    [provider]  metFORMIN (GLUCOPHAGE-XR) 500  MG 24 hr  tablet Take 1,000 mg by mouth 2 (two) times daily with a meal.    [provider]  oxyCODONE-acetaminophen (PERCOCET) 10-325 MG tablet Take 1 tablet by mouth every 6 (six) hours as needed for pain.    [provider]  OXYGEN Inhale 3 L into the lungs continuous.     [provider]  potassium chloride SA (K-DUR,KLOR-CON) 20 MEQ tablet Take 2 tablets (40 mEq total) by mouth daily. 11/07/14   Kinnie Feil, MD  rivaroxaban (XARELTO) 20 MG TABS tablet Take 1 tablet (20 mg total) by mouth daily with supper. Patient taking differently: Take 20 mg by mouth daily at 6 PM.  01/20/17   Jettie Booze, MD  rosuvastatin (CRESTOR) 20 MG tablet Take 1 tablet (20 mg total) by mouth daily. 05/07/18   Matilde Haymaker, MD  spironolactone (ALDACTONE) 25 MG tablet Take 1 tablet (25 mg total) by mouth at bedtime. 02/04/18 02/04/19  Shirley Friar, PA-C  torsemide (DEMADEX) 20 MG tablet Take 3 tablets (60 mg total) by mouth 2 (two) times daily. 08/02/18   Regalado, Cassie Freer, MD      Critical care time: 60 minutes.  The treatment and management of the patient's condition was required based on the threat of imminent deterioration. This time reflects time spent by the physician evaluating, providing care and managing the critically ill patient's care. The time was spent at the immediate bedside (or on the same floor/unit and dedicated to this patient's care). Time involved in separately billable procedures is NOT included int he critical care time indicated above. Family meeting and update time may be included above if and only if the patient is unable/incompetent to participate in clinical interview and/or decision making, and the discussion was necessary to determining treatment decisions.   Renee Pain, MD Board Certified by the ABIM, [place] Pager: [phone]

## 2018-11-13 NOTE — Progress Notes (Signed)
NAME:  Nicholas Caldwell MRN:  [id number] DOB:  30-May-1946 LOS: 0 ADMISSION DATE:  11/12/2018 DATE OF SERVICE:  11/13/2018  CHIEF COMPLAINT:  Fatigue/generalized weakness  HISTORY & PHYSICAL  History of Present Illness  This 73 y.o. morbidly obese African-American male smoker presented to the [facility] Emergency Department with complaints of progressively worsening fatigue and generalized weakness over the past week. At the time of clinical interview, the patient is on BiPAP. He is lethargic, arousable to noxious stimuli but uncooperative with clinical interview.  Patient did develop chills on the day of presentation.  He has a known history of COPD, for which she is on multiple bronchodilators and long-term oxygen therapy (4 LPM via nasal cannula).  The wife states that he has maintained SPO2 is in the 90s until today, when he demonstrated oxygen desaturation down to 75% prior to presentation to the emergency department.  No sick contacts.  He did get the flu vaccine this year.  Past Medical/Surgical/Social/Family History  Systolic heart failure EF 25 to 30%, recurrent paroxysmal atrial fibrillation,Coronary artery disease, COPD, diabetes, hypertension, nonischemic cardiomyopathy, thanks    1/11: Admitted with acute on chronic hypoxic and hypercarbic respiratory failure in the setting of decompensated heart failure plus minus element of CAP with acute exacerbation of COPD.  Seems to be responding to diuresis, treating broadly with broad-spectrum antibiotics, IV diuresis, scheduled bronchodilators, and systemic steroids. Procedures:  N/A   Significant Diagnostic Tests:  N/A   Micro Data:  Respiratory viral panel 1/11: Influenza panel negative. Urinary strep antigen 1/11 Urinary Legionella antigen 1/11  Antimicrobials:  Zosyn 1/10: Rocephin 1/11 Azithromycin 1/11  Interim history/subjective:  Feels a little bit better.  Awakens to verbal request follows commands  still on BiPAP   Objective   Blood Pressure 108/63   Pulse 84   Temperature 98.9 F (37.2 C) (Axillary)   Respiration (Abnormal) 25   Height 6' (1.829 m)   Weight 128.6 kg   Oxygen Saturation 100%   Body Mass Index 38.45 kg/m     Filed Weights   11/12/18 2257 11/13/18 0130  Weight: 133.5 kg 128.6 kg    Intake/Output Summary (Last 24 hours) at 11/13/2018 3419 Last data filed at 11/13/2018 0800 Gross per 24 hour  Intake 70 ml  Output 775 ml  Net -705 ml    Vent Mode: BIPAP FiO2 (%):  [40 %] 40 % Set Rate:  [6 bmp-20 bmp] 6 bmp PEEP:  [4 cmH20-8 cmH20] 8 cmH20   Examination: General: This is a 73 year old African-American male he is currently on BiPAP, will open eyes to verbal request HEENT normocephalic atraumatic BiPAP mask is in place he does have jugular venous distention Pulmonary: Scattered rhonchi, no significant wheezing, no accessory use on noninvasive ventilation Cardiac: Regular irregular with atrial fibrillation noted on telemetry currently with controlled ventricular rate Abdomen: Obese, nontender, positive bowel sounds Extremities: Warm, dry, significant bilateral lower extremity edema.  He does have brisk capillary refill and pulses are palpable Neuro: Arouses and awakens to request.  Moves all extremities, no focal deficits appreciated.  Resolved Hospital Problem list   N/A   Assessment & Plan:   Acute on chronic hypercarbic and Hypoxic respiratory failure in setting of what appears to be pulmonary edema from decompensated heart failure, +/- CAP w/ AECOPD Portable chest x-ray personally reviewed: This demonstrates bilateral airspace disease, probable bilateral effusions.   Plan Continuing BiPAP as tolerated, he is still at risk for intubation Aggressive diuresis, he apparently  had been titrating down his diuretics at home for no reason Continue scheduled bronchodilators Broad-spectrum antibiotics for community-acquired pneumonia coverage, changing  Zosyn to Rocephin and a azithromycin Check urinary strep and Legionella antigen Follow-up respiratory viral culture Continuing Solu-Medrol at 40 mg IV every 12 Repeating a.m. chest x-ray Limiting sedation and narcotics  Acute systolic HF (EF 06-01%); known NICM Plan Continue telemetry monitoring Aggressive diuresis as above Holding antihypertensives for now  H/o PAF  Plan Cont DOAC Cont tele   Acute metabolic encephalopathy  Chronic pain  Plan Holding narcotics Treat hypercarbia Supportive care  Type 2 DM Plan Sliding scale insulin  Normocytic normochromic anemia without evidence of bleeding Plan Trend CBC  Best practice:  Diet: NPO Pain/Anxiety/Delirium protocol (if indicated): not indicated VAP protocol (if indicated): HOB elevated 30+ deg DVT prophylaxis: Xarelto GI prophylaxis: famotidine Glucose control: sliding scale insulin Mobility/Activity: bed rest for now; advance as tolerated.   Code Status: Full Code Family Communication:  patient's family (wife) Disposition: He remains critically ill requiring close observation of respiratory status given risk of progressive respiratory failure.  He remains at high risk for intubation.  For now we will continue aggressive diuresis, intermittent arterial blood gas monitoring, empiric antibiotics for possible infection, and steroids as well as bronchodilators for possible element of COPD exacerbation    CCM time: 35 minutes

## 2018-11-13 NOTE — Progress Notes (Signed)
Pt very noncompliant with wearing ECG leads or BP cuff, multiple BPs missed d/t the patient removing the cuff.  Pt also refuses to let nurse know when getting up out of bed no matter how many times he is reminded.

## 2018-11-14 ENCOUNTER — Encounter (HOSPITAL_COMMUNITY): Payer: Self-pay | Admitting: *Deleted

## 2018-11-14 ENCOUNTER — Inpatient Hospital Stay (HOSPITAL_COMMUNITY): Payer: Medicare Other

## 2018-11-14 DIAGNOSIS — J9622 Acute and chronic respiratory failure with hypercapnia: Secondary | ICD-10-CM

## 2018-11-14 DIAGNOSIS — J9621 Acute and chronic respiratory failure with hypoxia: Secondary | ICD-10-CM

## 2018-11-14 LAB — BRAIN NATRIURETIC PEPTIDE: B Natriuretic Peptide: 1634.7 pg/mL — ABNORMAL HIGH (ref 0.0–100.0)

## 2018-11-14 LAB — BASIC METABOLIC PANEL
Anion gap: 11 (ref 5–15)
BUN: 31 mg/dL — ABNORMAL HIGH (ref 8–23)
CO2: 37 mmol/L — ABNORMAL HIGH (ref 22–32)
Calcium: 8.6 mg/dL — ABNORMAL LOW (ref 8.9–10.3)
Chloride: 92 mmol/L — ABNORMAL LOW (ref 98–111)
Creatinine, Ser: 1.1 mg/dL (ref 0.61–1.24)
GFR calc non Af Amer: >60 mL/min (ref >60)
Glucose, Bld: 126 mg/dL — ABNORMAL HIGH (ref 70–99)
Potassium: 3.8 mmol/L (ref 3.5–5.1)
Sodium: 140 mmol/L (ref 135–145)

## 2018-11-14 LAB — GLUCOSE, CAPILLARY
GLUCOSE-CAPILLARY: 153 mg/dL — AB (ref 70–99)
Glucose-Capillary: 107 mg/dL — ABNORMAL HIGH (ref 70–99)
Glucose-Capillary: 153 mg/dL — ABNORMAL HIGH (ref 70–99)
Glucose-Capillary: 205 mg/dL — ABNORMAL HIGH (ref 70–99)
Glucose-Capillary: 208 mg/dL — ABNORMAL HIGH (ref 70–99)
Glucose-Capillary: 225 mg/dL — ABNORMAL HIGH (ref 70–99)

## 2018-11-14 LAB — MAGNESIUM: Magnesium: 2.2 mg/dL (ref 1.7–2.4)

## 2018-11-14 LAB — PROCALCITONIN: Procalcitonin: <0.1 ng/mL

## 2018-11-14 MED ORDER — CARVEDILOL 6.25 MG PO TABS
6.2500 mg | ORAL_TABLET | Freq: Two times a day (BID) | ORAL | Status: DC
  Administered 2018-11-14 – 2018-11-16 (×3): 6.25 mg via ORAL
  Filled 2018-11-14: qty 2
  Filled 2018-11-14 (×2): qty 1
  Filled 2018-11-14: qty 2

## 2018-11-14 MED ORDER — MOMETASONE FURO-FORMOTEROL FUM 200-5 MCG/ACT IN AERO
2.0000 | INHALATION_SPRAY | Freq: Two times a day (BID) | RESPIRATORY_TRACT | Status: DC
  Administered 2018-11-14 – 2018-11-16 (×3): 2 via RESPIRATORY_TRACT
  Filled 2018-11-14: qty 8.8

## 2018-11-14 MED ORDER — FUROSEMIDE 10 MG/ML IJ SOLN
80.0000 mg | Freq: Two times a day (BID) | INTRAMUSCULAR | Status: AC
  Administered 2018-11-14: 80 mg via INTRAVENOUS
  Filled 2018-11-14: qty 8

## 2018-11-14 MED ORDER — INSULIN ASPART 100 UNIT/ML ~~LOC~~ SOLN
0.0000 [IU] | Freq: Three times a day (TID) | SUBCUTANEOUS | Status: DC
  Administered 2018-11-14: 7 [IU] via SUBCUTANEOUS
  Administered 2018-11-14: 4 [IU] via SUBCUTANEOUS
  Administered 2018-11-14: 7 [IU] via SUBCUTANEOUS
  Administered 2018-11-15: 3 [IU] via SUBCUTANEOUS
  Administered 2018-11-15 – 2018-11-16 (×3): 4 [IU] via SUBCUTANEOUS

## 2018-11-14 MED ORDER — LISINOPRIL 20 MG PO TABS
20.0000 mg | ORAL_TABLET | Freq: Every day | ORAL | Status: DC
  Administered 2018-11-14: 20 mg via ORAL
  Filled 2018-11-14 (×2): qty 1

## 2018-11-14 MED ORDER — GABAPENTIN 600 MG PO TABS
600.0000 mg | ORAL_TABLET | Freq: Two times a day (BID) | ORAL | Status: DC
  Administered 2018-11-14 – 2018-11-16 (×5): 600 mg via ORAL
  Filled 2018-11-14 (×5): qty 1

## 2018-11-14 MED ORDER — SPIRONOLACTONE 25 MG PO TABS
25.0000 mg | ORAL_TABLET | Freq: Every day | ORAL | Status: DC
  Administered 2018-11-14 – 2018-11-16 (×2): 25 mg via ORAL
  Filled 2018-11-14 (×3): qty 1

## 2018-11-14 MED ORDER — ALPRAZOLAM 0.5 MG PO TABS: 0.5000 mg | ORAL_TABLET | Freq: Once | ORAL | Status: DC

## 2018-11-14 NOTE — Progress Notes (Addendum)
NAME:  Nicholas Caldwell MRN:  [id number] DOB:  [birth year] LOS: 1 ADMISSION DATE:  11/12/2018 DATE OF SERVICE:  11/13/2018  CHIEF COMPLAINT:  Fatigue/generalized weakness  HISTORY & PHYSICAL  History of Present Illness  This 73 y.o. morbidly obese African-American male smoker presented to the [facility] Emergency Department with complaints of progressively worsening fatigue and generalized weakness over the past week. At the time of clinical interview, the patient is on BiPAP. He is lethargic, arousable to noxious stimuli but uncooperative with clinical interview.  Patient did develop chills on the day of presentation.  He has a known history of COPD, for which she is on multiple bronchodilators and long-term oxygen therapy (4 LPM via nasal cannula).  The wife states that he has maintained SPO2 is in the 90s until today, when he demonstrated oxygen desaturation down to 75% prior to presentation to the emergency department.  No sick contacts.  He did get the flu vaccine this year.  Past Medical/Surgical/Social/Family History  Systolic heart failure EF 25 to 30%, recurrent paroxysmal atrial fibrillation,Coronary artery disease, COPD, diabetes, hypertension, nonischemic cardiomyopathy, thanks    1/11: Admitted with acute on chronic hypoxic and hypercarbic respiratory failure in the setting of decompensated heart failure plus minus element of CAP with acute exacerbation of COPD.  Seems to be responding to diuresis, treating broadly with broad-spectrum antibiotics, IV diuresis, scheduled bronchodilators, and systemic steroids. 1/12: Off BiPAP since yesterday.  Now fully awake and oriented without focal deficits.  Breathing improved on 4 L nasal cannula.  Ready for transfer out of the intensive care, tolerating diet.  Stopping antibiotics, continuing to titrate diuretic and oxygen regimen.  He may benefit from heart failure follow-up team Procedures:  N/A   Significant Diagnostic Tests:    N/A   Micro Data:  Respiratory viral panel 1/11: Influenza panel negative. Urinary strep antigen 1/11 Urinary Legionella antigen 1/11  Antimicrobials:  Zosyn 1/10: Rocephin 1/11: Discontinue on 1/12 Azithromycin 1/11 discontinued on 1/12  Interim history/subjective:  Feeling much better   Objective   Blood Pressure 139/82   Pulse 81   Temperature 97.9 F (36.6 C) (Oral)   Respiration 18   Height 6' (1.829 m)   Weight 129.2 kg   Oxygen Saturation 98%   Body Mass Index 38.63 kg/m     Filed Weights   11/12/18 2257 11/13/18 0130 11/14/18 0500  Weight: 133.5 kg 128.6 kg 129.2 kg    Intake/Output Summary (Last 24 hours) at 11/14/2018 0923 Last data filed at 11/14/2018 0700 Gross per 24 hour  Intake 3241.43 ml  Output 1675 ml  Net 1566.43 ml    Vent Mode: BIPAP FiO2 (%):  [40 %-60 %] 60 % Set Rate:  [6 bmp] 6 bmp PEEP:  [8 cmH20] 8 cmH20   Examination: General: 73 year old male patient sitting up in bed he is in no acute distress this morning HEENT normocephalic atraumatic no jugular venous distention mucous membranes moist Pulmonary: Some expiratory wheeze, crackles bases.  No accessory use Cardiac: Regular irregular rate atrial fibrillation on telemetry Abdomen: Soft and nontender no organomegaly positive bowel sounds Extremities: Warm, dry, dependent edema persists.  No significant improvement in comparison to yesterday's exam Neuro: Awake, oriented, follows command, no focal deficits today.  Resolved Hospital Problem list   Acute encephalopathy  Assessment & Plan:   Acute on chronic hypercarbic and Hypoxic respiratory failure in setting of decompensated heart failure, +/- AECOPD Portable chest x-ray personally reviewed: Consistent with pulmonary edema Plan Continuing  aggressive diuresis Discontinue antibiotics Change Solu-Medrol to prednisone and rapidly taper Mobilize Walking oximetry Needs polysomnogram as outpatient  Acute systolic HF (EF  09-82%); known NICM Plan Continuing aggressive diuresis Add back spironolactone Add back ACE inhibitor Consider involving heart failure team  H/o PAF  Plan Continue DOAC Continue telemetry  Chronic pain  Plan Continuing home medication at reduced dosing  Type 2 DM Plan Sliding scale insulin  Normocytic normochromic anemia without evidence of bleeding Plan Trend CBC  Best practice:  Diet: NPO Pain/Anxiety/Delirium protocol (if indicated): not indicated VAP protocol (if indicated): HOB elevated 30+ deg DVT prophylaxis: Xarelto GI prophylaxis: famotidine Glucose control: sliding scale insulin Mobility/Activity: bed rest for now; advance as tolerated.   Code Status: Full Code Family Communication:  patient's family (wife) Disposition: He looks remarkably better.  Hemodynamically he is stable.  His oxygen is improved.  He is now ready for transfer out of the intensive care.  The remainder of his focus will be on finding the right recipe for his oxygenation and diuretic needs. Will have our office arrange out-pt followup   Erick Colace ACNP-BC [place] Pager # [phone] OR # [phone] if no answer

## 2018-11-14 NOTE — Progress Notes (Signed)
CSW received consult request for skilled nursing facility placement. CSW will follow until PT/OT evaluation is completed and will follow up with recommendations accordingly.  Lamonte Richer, LCSW, [place] Worker II [phone]

## 2018-11-14 NOTE — Progress Notes (Signed)
[place] Progress Note Patient Name: Nicholas Caldwell DOB: 1946/03/30 MRN: [id number]   Date of Service  11/14/2018  HPI/Events of Note  Patient requests sleep aid. Requests home Valium.  eICU Interventions  Will order: 1. Xanax 0.5 mg PO X 1.      Intervention Category Major Interventions: Other:  Lysle Dingwall 11/14/2018, 11:59 PM

## 2018-11-15 ENCOUNTER — Inpatient Hospital Stay (HOSPITAL_COMMUNITY): Payer: Medicare Other

## 2018-11-15 LAB — BASIC METABOLIC PANEL
Anion gap: 7 (ref 5–15)
BUN: 29 mg/dL — AB (ref 8–23)
CO2: 41 mmol/L — ABNORMAL HIGH (ref 22–32)
Calcium: 8.4 mg/dL — ABNORMAL LOW (ref 8.9–10.3)
Chloride: 90 mmol/L — ABNORMAL LOW (ref 98–111)
Creatinine, Ser: 1.06 mg/dL (ref 0.61–1.24)
GFR calc Af Amer: >60 mL/min (ref >60)
GFR calc non Af Amer: >60 mL/min (ref >60)
GLUCOSE: 136 mg/dL — AB (ref 70–99)
Potassium: 3.7 mmol/L (ref 3.5–5.1)
Sodium: 138 mmol/L (ref 135–145)

## 2018-11-15 LAB — GLUCOSE, CAPILLARY
Glucose-Capillary: 165 mg/dL — ABNORMAL HIGH (ref 70–99)
Glucose-Capillary: 133 mg/dL — ABNORMAL HIGH (ref 70–99)
Glucose-Capillary: 113 mg/dL — ABNORMAL HIGH (ref 70–99)
Glucose-Capillary: 176 mg/dL — ABNORMAL HIGH (ref 70–99)

## 2018-11-15 LAB — PROCALCITONIN: Procalcitonin: <0.1 ng/mL

## 2018-11-15 LAB — BRAIN NATRIURETIC PEPTIDE: B NATRIURETIC PEPTIDE 5: 1279.5 pg/mL — AB (ref 0.0–100.0)

## 2018-11-15 MED ORDER — TORSEMIDE 20 MG PO TABS
60.0000 mg | ORAL_TABLET | Freq: Two times a day (BID) | ORAL | Status: DC
  Administered 2018-11-15 – 2018-11-16 (×2): 60 mg via ORAL
  Filled 2018-11-15 (×3): qty 3

## 2018-11-15 MED ORDER — ROSUVASTATIN CALCIUM 20 MG PO TABS
20.0000 mg | ORAL_TABLET | Freq: Every day | ORAL | Status: DC
  Administered 2018-11-15 – 2018-11-16 (×2): 20 mg via ORAL
  Filled 2018-11-15 (×2): qty 1

## 2018-11-15 MED ORDER — SPIRONOLACTONE 25 MG PO TABS: 25.0000 mg | ORAL_TABLET | Freq: Every day | ORAL | Status: DC

## 2018-11-15 MED ORDER — IPRATROPIUM-ALBUTEROL 0.5-2.5 (3) MG/3ML IN SOLN: 3.0000 mL | RESPIRATORY_TRACT | Status: DC | PRN

## 2018-11-15 MED ORDER — FOLIC ACID 1 MG PO TABS
1.0000 mg | ORAL_TABLET | Freq: Every day | ORAL | Status: DC
  Administered 2018-11-15 – 2018-11-16 (×2): 1 mg via ORAL
  Filled 2018-11-15 (×2): qty 1

## 2018-11-15 MED ORDER — RIVAROXABAN 20 MG PO TABS: 20.0000 mg | ORAL_TABLET | Freq: Every day | ORAL | Status: DC

## 2018-11-15 MED ORDER — GABAPENTIN 600 MG PO TABS: 600.0000 mg | ORAL_TABLET | Freq: Two times a day (BID) | ORAL | Status: DC

## 2018-11-15 MED ORDER — PREDNISONE 20 MG PO TABS
40.0000 mg | ORAL_TABLET | Freq: Every day | ORAL | Status: DC
  Filled 2018-11-15: qty 2

## 2018-11-15 MED ORDER — INSULIN ASPART PROT & ASPART (70-30 MIX) 100 UNIT/ML PEN
30.0000 [IU] | PEN_INJECTOR | Freq: Two times a day (BID) | SUBCUTANEOUS | Status: DC
  Filled 2018-11-15 (×2): qty 3

## 2018-11-15 MED ORDER — LISINOPRIL 20 MG PO TABS: 20.0000 mg | ORAL_TABLET | Freq: Every day | ORAL | Status: DC

## 2018-11-15 MED ORDER — IPRATROPIUM-ALBUTEROL 0.5-2.5 (3) MG/3ML IN SOLN
3.0000 mL | Freq: Four times a day (QID) | RESPIRATORY_TRACT | Status: DC
  Administered 2018-11-15 (×2): 3 mL via RESPIRATORY_TRACT
  Filled 2018-11-15: qty 3

## 2018-11-15 MED ORDER — INSULIN ASPART PROT & ASPART (70-30 MIX) 100 UNIT/ML PEN: 30.0000 [IU] | PEN_INJECTOR | Freq: Two times a day (BID) | SUBCUTANEOUS | Status: DC

## 2018-11-15 MED ORDER — FERROUS SULFATE 325 (65 FE) MG PO TABS: 325.0000 mg | ORAL_TABLET | Freq: Three times a day (TID) | ORAL | Status: DC | PRN

## 2018-11-15 MED ORDER — INSULIN ASPART PROT & ASPART (70-30 MIX) 100 UNIT/ML ~~LOC~~ SUSP
30.0000 [IU] | Freq: Two times a day (BID) | SUBCUTANEOUS | Status: DC
  Administered 2018-11-16: 30 [IU] via SUBCUTANEOUS
  Filled 2018-11-15: qty 10

## 2018-11-15 MED ORDER — IPRATROPIUM-ALBUTEROL 0.5-2.5 (3) MG/3ML IN SOLN
3.0000 mL | Freq: Four times a day (QID) | RESPIRATORY_TRACT | Status: DC
  Administered 2018-11-16 (×2): 3 mL via RESPIRATORY_TRACT
  Filled 2018-11-15 (×2): qty 3

## 2018-11-15 MED ORDER — CARVEDILOL 3.125 MG PO TABS: 6.2500 mg | ORAL_TABLET | Freq: Two times a day (BID) | ORAL | Status: DC

## 2018-11-15 MED ORDER — IPRATROPIUM-ALBUTEROL 0.5-2.5 (3) MG/3ML IN SOLN
RESPIRATORY_TRACT | Status: AC
  Filled 2018-11-15: qty 3

## 2018-11-15 NOTE — Progress Notes (Signed)
PROGRESS NOTE    Nicholas Caldwell  JQZ:[id number] DOB: 1946-10-07 DOA: 11/12/2018 PCP: [facility]   Brief Narrative:  73 year old with past medical history of systolic CHF ejection fraction 25%, paroxysmal atrial fibrillation on Xarelto, coronary artery disease, COPD, diabetes mellitus type 2, nonischemic cardiomyopathy was brought to the hospital for generalized weakness and fatigue.  He was found to be very lethargic and hypoxic hypercarbic respiratory failure.  This was thought to be due to COPD exacerbation and decompensated CHF.  He was placed on BiPAP, aggressively diuresed and received bronchodilators.  Over the course of couple of days he did better.   Assessment & Plan:   Principal Problem:   Acute on chronic respiratory failure with hypoxia and hypercapnia (HCC) Active Problems:   COPD exacerbation (HCC)   Morbid obesity (HCC)   PAF (paroxysmal atrial fibrillation) (HCC)   Nonischemic cardiomyopathy (HCC)   Diabetic polyneuropathy associated with diabetes mellitus due to underlying condition (HCC)   CHF exacerbation (HCC)   Metabolic encephalopathy   Acute on chronic hypoxic and hypercarbic respiratory failure secondary to decompensated systolic congestive heart failure, ejection fraction 25% Acute exacerbation of mild persistent COPD Community-acquired pneumonia - Weaned off BiPAP, on nasal cannula - Continue scheduled and as needed bronchodilators.  Flu swab-negative -Urinary strep and Legionella-pending - Procalcitonin-negative.  Antibiotics stopped - Resume home with mild 60 mg twice daily, Aldactone 25 mg daily -Continue Coreg 6.25 mg twice daily, lisinopril 20 mg daily, statin and Xarelto  Paroxysmal atrial fibrillation -Rate controlled on Coreg, continue Xarelto  Hyperlipidemia -Continue Crestor 20 mg daily  Diabetes mellitus type 2, insulin-dependent -Resume home NovoLog 70/30 30 units twice daily, insulin sliding scale and Accu-Chek  Peripheral  neuropathy secondary to diabetes mellitus type 2 -Continue gabapentin 600 mg twice daily   DVT prophylaxis: Xarelto Code Status: Full code Family Communication: None at bedside Disposition Plan: Transfer patient out of stepdown  Consultants:   Critical care  Procedures:   None  Antimicrobials:   Zosyn 1/10  Rocephin and azithromycin 1/11-1/12   Subjective: Patient feels a little better but still has quite a bit of cough and congestion.  Review of Systems Otherwise negative except as per HPI, including: General: Denies fever, chills, night sweats or unintended weight loss. Resp: Denies hemoptysis Cardiac: Denies chest pain, palpitations, orthopnea, paroxysmal nocturnal dyspnea. GI: Denies abdominal pain, nausea, vomiting, diarrhea or constipation GU: Denies dysuria, frequency, hesitancy or incontinence MS: Denies muscle aches, joint pain or swelling Neuro: Denies headache, neurologic deficits (focal weakness, numbness, tingling), abnormal gait Psych: Denies anxiety, depression, SI/HI/AVH Skin: Denies new rashes or lesions ID: Denies sick contacts, exotic exposures, travel  Objective: Vitals:   11/15/18 0831 11/15/18 0833 11/15/18 0900 11/15/18 1000  BP: (!) 95/52  (!) 95/52 (!) 108/59  Pulse:   (!) 109 (!) 147  Resp:   19 18  Temp:      TempSrc:      SpO2:  97% 93% 91%  Weight:      Height:        Intake/Output Summary (Last 24 hours) at 11/15/2018 1343 Last data filed at 11/15/2018 1300 Gross per 24 hour  Intake -  Output 1275 ml  Net -1275 ml   Filed Weights   11/12/18 2257 11/13/18 0130 11/14/18 0500  Weight: 133.5 kg 128.6 kg 129.2 kg    Examination:  General exam: Appears calm and comfortable on 4 L nasal cannula Respiratory system: Diffuse coarse breath sounds Cardiovascular system: S1 & S2 heard, RRR. No  JVD, murmurs, rubs, gallops or clicks.  1+ bilateral lower extremity pitting edema Gastrointestinal system: Abdomen is nondistended, soft and  nontender. No organomegaly or masses felt. Normal bowel sounds heard. Central nervous system: Alert and oriented. No focal neurological deficits. Extremities: Symmetric 5 x 5 power. Skin: No rashes, lesions or ulcers Psychiatry: Judgement and insight appear normal. Mood & affect appropriate.     Data Reviewed:   CBC: Recent Labs  Lab 11/12/18 2249 11/13/18 0602  WBC 10.2 8.5  NEUTROABS 8.2*  --   HGB 9.1* 10.3*  HCT 34.1* 37.0*  MCV 94.7 92.7  PLT 336 503   Basic Metabolic Panel: Recent Labs  Lab 11/12/18 2249 11/13/18 0602 11/13/18 1600 11/14/18 0557 11/15/18 0122  NA 142  --  140 140 138  K 4.4  --  4.0 3.8 3.7  CL 93*  --  91* 92* 90*  CO2 40*  --  35* 37* 41*  GLUCOSE 153*  --  191* 126* 136*  BUN 17  --  20 31* 29*  CREATININE 1.10 1.05 1.14 1.10 1.06  CALCIUM 8.4*  --  8.3* 8.6* 8.4*  MG 2.3  --   --  2.2  --    GFR: Estimated Creatinine Clearance: 90.1 mL/min (by C-G formula based on SCr of 1.06 mg/dL). Liver Function Tests: No results for input(s): AST, ALT, ALKPHOS, BILITOT, PROT, ALBUMIN in the last 168 hours. No results for input(s): LIPASE, AMYLASE in the last 168 hours. No results for input(s): AMMONIA in the last 168 hours. Coagulation Profile: Recent Labs  Lab 11/12/18 2249  INR 1.35   Cardiac Enzymes: No results for input(s): CKTOTAL, CKMB, CKMBINDEX, TROPONINI in the last 168 hours. BNP (last 3 results) No results for input(s): PROBNP in the last 8760 hours. HbA1C: Recent Labs    11/13/18 0602  HGBA1C 6.5*   CBG: Recent Labs  Lab 11/14/18 1138 11/14/18 1533 11/14/18 2112 11/15/18 0824 11/15/18 1159  GLUCAP 205* 208* 153* 165* 133*   Lipid Profile: No results for input(s): CHOL, HDL, LDLCALC, TRIG, CHOLHDL, LDLDIRECT in the last 72 hours. Thyroid Function Tests: No results for input(s): TSH, T4TOTAL, FREET4, T3FREE, THYROIDAB in the last 72 hours. Anemia Panel: No results for input(s): VITAMINB12, FOLATE, FERRITIN, TIBC,  IRON, RETICCTPCT in the last 72 hours. Sepsis Labs: Recent Labs  Lab 11/13/18 0258 11/14/18 0557 11/15/18 0122  PROCALCITON <0.10 <0.10 <0.10    Recent Results (from the past 240 hour(s))  Respiratory Panel by PCR     Status: None   Collection Time: 11/13/18 12:50 AM  Result Value Ref Range Status   Adenovirus NOT DETECTED NOT DETECTED Final   Coronavirus 229E NOT DETECTED NOT DETECTED Final   Coronavirus HKU1 NOT DETECTED NOT DETECTED Final   Coronavirus NL63 NOT DETECTED NOT DETECTED Final   Coronavirus OC43 NOT DETECTED NOT DETECTED Final   Metapneumovirus NOT DETECTED NOT DETECTED Final   Rhinovirus / Enterovirus NOT DETECTED NOT DETECTED Final   Influenza A NOT DETECTED NOT DETECTED Final   Influenza B NOT DETECTED NOT DETECTED Final   Parainfluenza Virus 1 NOT DETECTED NOT DETECTED Final   Parainfluenza Virus 2 NOT DETECTED NOT DETECTED Final   Parainfluenza Virus 3 NOT DETECTED NOT DETECTED Final   Parainfluenza Virus 4 NOT DETECTED NOT DETECTED Final   Respiratory Syncytial Virus NOT DETECTED NOT DETECTED Final   Bordetella pertussis NOT DETECTED NOT DETECTED Final   Chlamydophila pneumoniae NOT DETECTED NOT DETECTED Final   Mycoplasma pneumoniae NOT DETECTED  NOT DETECTED Final  MRSA PCR Screening     Status: None   Collection Time: 11/13/18  2:55 AM  Result Value Ref Range Status   MRSA by PCR NEGATIVE NEGATIVE Final    Comment:        The GeneXpert MRSA Assay (FDA approved for NASAL specimens only), is one component of a comprehensive MRSA colonization surveillance program. It is not intended to diagnose MRSA infection nor to guide or monitor treatment for MRSA infections. Performed at [facility], [place] [address], [place], [place] [address]       Radiology Studies: Dg Chest Port 1 View  Result Date: 11/15/2018 CLINICAL DATA:  Pulmonary edema. EXAM: PORTABLE CHEST 1 VIEW COMPARISON:  11/14/2018.  11/12/2018. FINDINGS: Cardiomegaly with persistent  bilateral pulmonary infiltrates/edema. Left base atelectasis. Left-sided pleural effusion. No pneumothorax. IMPRESSION: Cardiomegaly with persistent bilateral pulmonary infiltrates/edema. Left base atelectasis. Left-sided pleural effusion. Similar findings noted on prior exams. Electronically Signed   By: Marcello Moores  Register   On: 11/15/2018 07:14   Dg Chest Port 1 View  Result Date: 11/14/2018 CLINICAL DATA:  Pulmonary edema EXAM: PORTABLE CHEST 1 VIEW COMPARISON:  Chest radiograph 11/12/2018 FINDINGS: Monitoring leads overlie the patient. Stable cardiomegaly. Diffuse bilateral interstitial pulmonary opacities with persistent consolidation within the left mid and lower lung. Small to moderate left pleural effusion. No pneumothorax. IMPRESSION: Persistent consolidation within the left mid and lower lung which may represent or focal edema underlying a left effusion or potentially pneumonia. Diffuse bilateral interstitial opacities favored to represent edema. Cardiomegaly. Electronically Signed   By: Lovey Newcomer M.D.   On: 11/14/2018 07:43    Scheduled Meds: . ALPRAZolam  0.5 mg Oral Once  . carvedilol  6.25 mg Oral BID WC  . gabapentin  600 mg Oral BID  . insulin aspart  0-20 Units Subcutaneous TID AC & HS  . ipratropium-albuterol  3 mL Nebulization Q6H  . lisinopril  20 mg Oral Daily  . mometasone-formoterol  2 puff Inhalation BID  . rivaroxaban  20 mg Oral Q supper  . spironolactone  25 mg Oral Daily   Continuous Infusions:   LOS: 2 days   Time spent= 35 mins    Neill Caldwell Arsenio Loader, MD [facility]  If 7PM-7AM, please contact night-coverage [email or web address] 11/15/2018, 1:43 PM

## 2018-11-16 LAB — CBC
HCT: 28.3 % — ABNORMAL LOW (ref 39.0–52.0)
Hemoglobin: 8.1 g/dL — ABNORMAL LOW (ref 13.0–17.0)
MCH: 25.9 pg — ABNORMAL LOW (ref 26.0–34.0)
MCHC: 28.6 g/dL — ABNORMAL LOW (ref 30.0–36.0)
MCV: 90.4 fL (ref 80.0–100.0)
NRBC: 0.6 % — AB (ref 0.0–0.2)
Platelets: 338 10*3/uL (ref 150–400)
RBC: 3.13 MIL/uL — AB (ref 4.22–5.81)
RDW: 17.1 % — ABNORMAL HIGH (ref 11.5–15.5)
WBC: 7.3 10*3/uL (ref 4.0–10.5)

## 2018-11-16 LAB — BASIC METABOLIC PANEL
Anion gap: 7 (ref 5–15)
BUN: 29 mg/dL — ABNORMAL HIGH (ref 8–23)
CO2: 41 mmol/L — ABNORMAL HIGH (ref 22–32)
Calcium: 8.1 mg/dL — ABNORMAL LOW (ref 8.9–10.3)
Chloride: 92 mmol/L — ABNORMAL LOW (ref 98–111)
Creatinine, Ser: 1.13 mg/dL (ref 0.61–1.24)
GFR calc Af Amer: >60 mL/min (ref >60)
GFR calc non Af Amer: >60 mL/min (ref >60)
Glucose, Bld: 112 mg/dL — ABNORMAL HIGH (ref 70–99)
POTASSIUM: 3.2 mmol/L — AB (ref 3.5–5.1)
Sodium: 140 mmol/L (ref 135–145)

## 2018-11-16 LAB — MAGNESIUM: Magnesium: 2.1 mg/dL (ref 1.7–2.4)

## 2018-11-16 LAB — GLUCOSE, CAPILLARY: Glucose-Capillary: 186 mg/dL — ABNORMAL HIGH (ref 70–99)

## 2018-11-16 LAB — BRAIN NATRIURETIC PEPTIDE: B Natriuretic Peptide: 606.1 pg/mL — ABNORMAL HIGH (ref 0.0–100.0)

## 2018-11-16 MED ORDER — POTASSIUM CHLORIDE CRYS ER 20 MEQ PO TBCR
30.0000 meq | EXTENDED_RELEASE_TABLET | ORAL | Status: DC
  Administered 2018-11-16: 30 meq via ORAL
  Filled 2018-11-16: qty 1

## 2018-11-16 MED ORDER — PREDNISONE 20 MG PO TABS: 40.0000 mg | ORAL_TABLET | Freq: Every day | ORAL | 0 refills | Status: DC

## 2018-11-16 MED ORDER — PREDNISONE 20 MG PO TABS: 40.0000 mg | ORAL_TABLET | Freq: Every day | ORAL | 0 refills | Status: AC

## 2018-11-16 NOTE — Discharge Summary (Signed)
Physician Discharge Summary  Nicholas Caldwell NUU:[id number] DOB: [birth year] DOA: 11/12/2018  PCP: [facility]  Admit date: 11/12/2018 Discharge date: 11/16/2018  Admitted From: Home Disposition: Home with home health  Recommendations for Outpatient Follow-up:  1. Follow up with PCP in 1-2 weeks 2. Please obtain BMP/CBC in one week your next doctors visit.  3. Follow-up outpatient with cardiology in 3-4 weeks  Home Health: PT Equipment/Devices: None Discharge Condition: Stable CODE STATUS: Full code  Diet recommendation: Heart healthy  Brief/Interim Summary: 73 year old with past medical history of systolic CHF ejection fraction 25%, paroxysmal atrial fibrillation on Xarelto, coronary artery disease, COPD, diabetes mellitus type 2, nonischemic cardiomyopathy was brought to the hospital for generalized weakness and fatigue.  He was found to be very lethargic and hypoxic hypercarbic respiratory failure.  This was thought to be due to COPD exacerbation and decompensated CHF.  He was placed on BiPAP, aggressively diuresed and received bronchodilators.  Over the course of couple of days he did better.  On the day of discharge he was also evaluated by the physical therapist who recommended home health therefore arrangements were made. Today he is reached maximal benefit from hospital stay and stable for discharge with recommendations as stated above.   Discharge Diagnoses:  Principal Problem:   Acute on chronic respiratory failure with hypoxia and hypercapnia (HCC) Active Problems:   COPD exacerbation (HCC)   Morbid obesity (HCC)   PAF (paroxysmal atrial fibrillation) (HCC)   Nonischemic cardiomyopathy (HCC)   Diabetic polyneuropathy associated with diabetes mellitus due to underlying condition (HCC)   CHF exacerbation (HCC)   Metabolic encephalopathy   Acute on chronic hypoxic and hypercarbic respiratory failure secondary to decompensated systolic congestive heart failure,  ejection fraction 25% Acute exacerbation of mild persistent COPD Community-acquired pneumonia - Weaned off BiPAP, currently on 3 L nasal cannula.  Uses 3 L at home. - Advised to continue using bronchodilators at home..  Flu swab-negative - Procalcitonin-negative.  Antibiotics stopped - Resume home with mild 60 mg twice daily, Aldactone 25 mg daily -Continue Coreg 6.25 mg twice daily, lisinopril 20 mg daily, statin and Xarelto  Paroxysmal atrial fibrillation -Rate controlled on Coreg, continue Xarelto  Hyperlipidemia -Continue Crestor 20 mg daily  Diabetes mellitus type 2, insulin-dependent -Resume home medications  Peripheral neuropathy secondary to diabetes mellitus type 2 -Continue gabapentin 600 mg twice daily   Patient was on Xarelto during the hospitalization Full code Wife at bedside  Discharge Instructions   Allergies as of 11/16/2018   No Known Allergies     Medication List    TAKE these medications   albuterol 108 (90 Base) MCG/ACT inhaler Commonly known as:  PROVENTIL HFA;VENTOLIN HFA Inhale 2 puffs into the lungs every 6 (six) hours as needed for wheezing or shortness of breath.   budesonide-formoterol 160-4.5 MCG/ACT inhaler Commonly known as:  SYMBICORT Inhale 2 puffs into the lungs 2 (two) times daily.   carvedilol 6.25 MG tablet Commonly known as:  COREG Take 1 tablet (6.25 mg total) by mouth 2 (two) times daily with a meal.   diazepam 10 MG tablet Commonly known as:  VALIUM Take 0.5 tablets (5 mg total) by mouth daily as needed for anxiety or sleep. What changed:  how much to take   ferrous sulfate 325 (65 FE) MG tablet Take 325 mg by mouth 3 (three) times daily as needed (for supplementation).   fluticasone 50 MCG/ACT nasal spray Commonly known as:  FLONASE Place 2 sprays into both nostrils daily as needed  for allergies.   folic acid 1 MG tablet Commonly known as:  FOLVITE Take 1 mg by mouth daily.   gabapentin 600 MG  tablet Commonly known as:  NEURONTIN Take 1 tablet (600 mg total) by mouth 2 (two) times daily.   glucose 4 GM chewable tablet Chew 4 tablets by mouth See admin instructions. Chew 4 tablets by mouth as needed for low blood sugar - repeat every 15 minutes if blood sugar less than 70   guaifenesin 400 MG Tabs tablet Commonly known as:  HUMIBID E Take 1 tablet (400 mg total) by mouth every 6 (six) hours as needed.   hydrocortisone 1 % lotion Apply 1 application topically See admin instructions. Apply small amount to back twice daily for itchy rash   hydrocortisone 2.5 % rectal cream Commonly known as:  ANUSOL-HC Place 1 application rectally 2 (two) times daily as needed for hemorrhoids or itching.   insulin aspart protamine - aspart (70-30) 100 UNIT/ML FlexPen Commonly known as:  NOVOLOG MIX 70/30 FLEXPEN Inject 0.05 mLs (5 Units total) into the skin 2 (two) times daily. What changed:  how much to take   ipratropium-albuterol 0.5-2.5 (3) MG/3ML Soln Commonly known as:  DUONEB Take 3 mLs by nebulization 2 (two) times daily.   latanoprost 0.005 % ophthalmic solution Commonly known as:  XALATAN Place 1 drop into both eyes at bedtime.   lisinopril 20 MG tablet Commonly known as:  PRINIVIL,ZESTRIL Take 1 tablet (20 mg total) by mouth daily.   Magnesium Oxide 420 (252 Mg) MG Tabs Take 420 mg by mouth 2 (two) times daily.   metFORMIN 500 MG 24 hr tablet Commonly known as:  GLUCOPHAGE-XR Take 1,000 mg by mouth 2 (two) times daily with a meal.   oxyCODONE-acetaminophen 10-325 MG tablet Commonly known as:  PERCOCET Take 1 tablet by mouth every 6 (six) hours as needed for pain.   OXYGEN Inhale 3 L into the lungs continuous.   potassium chloride SA 20 MEQ tablet Commonly known as:  K-DUR,KLOR-CON Take 2 tablets (40 mEq total) by mouth daily.   predniSONE 20 MG tablet Commonly known as:  DELTASONE Take 2 tablets (40 mg total) by mouth daily with breakfast for 3 days. Start  taking on:  November 17, 2018   rivaroxaban 20 MG Tabs tablet Commonly known as:  XARELTO Take 1 tablet (20 mg total) by mouth daily with supper. What changed:  when to take this   rosuvastatin 20 MG tablet Commonly known as:  CRESTOR Take 1 tablet (20 mg total) by mouth daily.   spironolactone 25 MG tablet Commonly known as:  ALDACTONE Take 1 tablet (25 mg total) by mouth at bedtime.   torsemide 20 MG tablet Commonly known as:  DEMADEX Take 3 tablets (60 mg total) by mouth 2 (two) times daily.      Follow-up Information    Clinic, [facility]. Schedule an appointment as soon as possible for a visit in 1 week(s).   Contact information: [place] [id number] [phone]          No Known Allergies  You were cared for by a hospitalist during your hospital stay. If you have any questions about your discharge medications or the care you received while you were in the hospital after you are discharged, you can call the unit and asked to speak with the hospitalist on call if the hospitalist that took care of you is not available. Once you are discharged, your primary  care physician will handle any further medical issues. Please note that no refills for any discharge medications will be authorized once you are discharged, as it is imperative that you return to your primary care physician (or establish a relationship with a primary care physician if you do not have one) for your aftercare needs so that they can reassess your need for medications and monitor your lab values.  Consultations:  Critical care   Procedures/Studies: Dg Chest Port 1 View  Result Date: 11/15/2018 CLINICAL DATA:  Pulmonary edema. EXAM: PORTABLE CHEST 1 VIEW COMPARISON:  11/14/2018.  11/12/2018. FINDINGS: Cardiomegaly with persistent bilateral pulmonary infiltrates/edema. Left base atelectasis. Left-sided pleural effusion. No pneumothorax. IMPRESSION: Cardiomegaly with  persistent bilateral pulmonary infiltrates/edema. Left base atelectasis. Left-sided pleural effusion. Similar findings noted on prior exams. Electronically Signed   By: Marcello Moores  Register   On: 11/15/2018 07:14   Dg Chest Port 1 View  Result Date: 11/14/2018 CLINICAL DATA:  Pulmonary edema EXAM: PORTABLE CHEST 1 VIEW COMPARISON:  Chest radiograph 11/12/2018 FINDINGS: Monitoring leads overlie the patient. Stable cardiomegaly. Diffuse bilateral interstitial pulmonary opacities with persistent consolidation within the left mid and lower lung. Small to moderate left pleural effusion. No pneumothorax. IMPRESSION: Persistent consolidation within the left mid and lower lung which may represent or focal edema underlying a left effusion or potentially pneumonia. Diffuse bilateral interstitial opacities favored to represent edema. Cardiomegaly. Electronically Signed   By: Lovey Newcomer M.D.   On: 11/14/2018 07:43   Dg Chest Port 1 View  Result Date: 11/12/2018 CLINICAL DATA:  Shortness of breath EXAM: PORTABLE CHEST 1 VIEW COMPARISON:  10/26/2018 FINDINGS: Cardiomegaly with vascular congestion and moderate pulmonary edema. There are small pleural effusions. Aortic atherosclerosis. No pneumothorax. Persistent left lung base opacity. IMPRESSION: Cardiomegaly with vascular congestion and moderate diffuse interstitial opacities suspicious for pulmonary edema. More confluent hazy lung base opacities may reflect atelectasis or superimposed pneumonia. There are small pleural effusions. Electronically Signed   By: Donavan Foil M.D.   On: 11/12/2018 23:10   Dg Chest Portable 1 View  Result Date: 10/26/2018 CLINICAL DATA:  Shortness of breath. Cough. Wheezing. Hypoxia. Congestive heart failure. EXAM: PORTABLE CHEST 1 VIEW COMPARISON:  07/28/2018 FINDINGS: Stable cardiomegaly. Pulmonary opacity is again seen in the left lower lung, without significant change. Right lung remains clear. IMPRESSION: Stable pulmonary opacity in  left lower lung. Stable cardiomegaly. Electronically Signed   By: Earle Gell M.D.   On: 10/26/2018 19:15      Subjective: Feels better, no complaints.  Wants to go home.   Discharge Exam: Vitals:   11/16/18 0700 11/16/18 0816  BP:  (!) 97/50  Pulse:  80  Resp:    Temp:    SpO2: 95%    Vitals:   11/15/18 2324 11/16/18 0336 11/16/18 0700 11/16/18 0816  BP: (!) 91/56 (!) 91/54  (!) 97/50  Pulse: 76 85  80  Resp: 12 15    Temp: 98.8 F (37.1 C) 98.9 F (37.2 C)    TempSrc: Axillary Axillary    SpO2: 99% 97% 95%   Weight:      Height:        General: Pt is alert, awake, not in acute distress Cardiovascular: RRR, S1/S2 +, no rubs, no gallops Respiratory: CTA bilaterally, no wheezing, no rhonchi Abdominal: Soft, NT, ND, bowel sounds + Extremities: no edema, no cyanosis    The results of significant diagnostics from this hospitalization (including imaging, microbiology, ancillary and laboratory) are listed below for reference.  Microbiology: Recent Results (from the past 240 hour(s))  Respiratory Panel by PCR     Status: None   Collection Time: 11/13/18 12:50 AM  Result Value Ref Range Status   Adenovirus NOT DETECTED NOT DETECTED Final   Coronavirus 229E NOT DETECTED NOT DETECTED Final   Coronavirus HKU1 NOT DETECTED NOT DETECTED Final   Coronavirus NL63 NOT DETECTED NOT DETECTED Final   Coronavirus OC43 NOT DETECTED NOT DETECTED Final   Metapneumovirus NOT DETECTED NOT DETECTED Final   Rhinovirus / Enterovirus NOT DETECTED NOT DETECTED Final   Influenza A NOT DETECTED NOT DETECTED Final   Influenza B NOT DETECTED NOT DETECTED Final   Parainfluenza Virus 1 NOT DETECTED NOT DETECTED Final   Parainfluenza Virus 2 NOT DETECTED NOT DETECTED Final   Parainfluenza Virus 3 NOT DETECTED NOT DETECTED Final   Parainfluenza Virus 4 NOT DETECTED NOT DETECTED Final   Respiratory Syncytial Virus NOT DETECTED NOT DETECTED Final   Bordetella pertussis NOT DETECTED NOT  DETECTED Final   Chlamydophila pneumoniae NOT DETECTED NOT DETECTED Final   Mycoplasma pneumoniae NOT DETECTED NOT DETECTED Final  MRSA PCR Screening     Status: None   Collection Time: 11/13/18  2:55 AM  Result Value Ref Range Status   MRSA by PCR NEGATIVE NEGATIVE Final    Comment:        The GeneXpert MRSA Assay (FDA approved for NASAL specimens only), is one component of a comprehensive MRSA colonization surveillance program. It is not intended to diagnose MRSA infection nor to guide or monitor treatment for MRSA infections. Performed at [facility], [place] [address], [place], [place] [address]      Labs: BNP (last 3 results) Recent Labs    11/14/18 0556 11/15/18 0122 11/16/18 0237  BNP 1,634.7* 1,279.5* 295.1*   Basic Metabolic Panel: Recent Labs  Lab 11/12/18 2249 11/13/18 0602 11/13/18 1600 11/14/18 0557 11/15/18 0122 11/16/18 0237  NA 142  --  140 140 138 140  K 4.4  --  4.0 3.8 3.7 3.2*  CL 93*  --  91* 92* 90* 92*  CO2 40*  --  35* 37* 41* 41*  GLUCOSE 153*  --  191* 126* 136* 112*  BUN 17  --  20 31* 29* 29*  CREATININE 1.10 1.05 1.14 1.10 1.06 1.13  CALCIUM 8.4*  --  8.3* 8.6* 8.4* 8.1*  MG 2.3  --   --  2.2  --  2.1   Liver Function Tests: No results for input(s): AST, ALT, ALKPHOS, BILITOT, PROT, ALBUMIN in the last 168 hours. No results for input(s): LIPASE, AMYLASE in the last 168 hours. No results for input(s): AMMONIA in the last 168 hours. CBC: Recent Labs  Lab 11/12/18 2249 11/13/18 0602 11/16/18 0237  WBC 10.2 8.5 7.3  NEUTROABS 8.2*  --   --   HGB 9.1* 10.3* 8.1*  HCT 34.1* 37.0* 28.3*  MCV 94.7 92.7 90.4  PLT 336 268 338   Cardiac Enzymes: No results for input(s): CKTOTAL, CKMB, CKMBINDEX, TROPONINI in the last 168 hours. BNP: Invalid input(s): POCBNP CBG: Recent Labs  Lab 11/15/18 0824 11/15/18 1159 11/15/18 1634 11/15/18 2141 11/16/18 0725  GLUCAP 165* 133* 113* 176* 186*   D-Dimer No results for input(s):  DDIMER in the last 72 hours. Hgb A1c No results for input(s): HGBA1C in the last 72 hours. Lipid Profile No results for input(s): CHOL, HDL, LDLCALC, TRIG, CHOLHDL, LDLDIRECT in the last 72 hours. Thyroid function studies No results for  input(s): TSH, T4TOTAL, T3FREE, THYROIDAB in the last 72 hours.  Invalid input(s): FREET3 Anemia work up No results for input(s): VITAMINB12, FOLATE, FERRITIN, TIBC, IRON, RETICCTPCT in the last 72 hours. Urinalysis    Component Value Date/Time   COLORURINE YELLOW 12/13/2016 0420   APPEARANCEUR CLEAR 12/13/2016 0420   LABSPEC 1.010 12/13/2016 0420   PHURINE 5.0 12/13/2016 0420   GLUCOSEU >=500 (A) 12/13/2016 0420   HGBUR NEGATIVE 12/13/2016 0420   BILIRUBINUR NEGATIVE 12/13/2016 0420   KETONESUR NEGATIVE 12/13/2016 0420   PROTEINUR 100 (A) 12/13/2016 0420   UROBILINOGEN 0.2 10/26/2014 2206   NITRITE NEGATIVE 12/13/2016 0420   LEUKOCYTESUR NEGATIVE 12/13/2016 0420   Sepsis Labs Invalid input(s): PROCALCITONIN,  WBC,  LACTICIDVEN Microbiology Recent Results (from the past 240 hour(s))  Respiratory Panel by PCR     Status: None   Collection Time: 11/13/18 12:50 AM  Result Value Ref Range Status   Adenovirus NOT DETECTED NOT DETECTED Final   Coronavirus 229E NOT DETECTED NOT DETECTED Final   Coronavirus HKU1 NOT DETECTED NOT DETECTED Final   Coronavirus NL63 NOT DETECTED NOT DETECTED Final   Coronavirus OC43 NOT DETECTED NOT DETECTED Final   Metapneumovirus NOT DETECTED NOT DETECTED Final   Rhinovirus / Enterovirus NOT DETECTED NOT DETECTED Final   Influenza A NOT DETECTED NOT DETECTED Final   Influenza B NOT DETECTED NOT DETECTED Final   Parainfluenza Virus 1 NOT DETECTED NOT DETECTED Final   Parainfluenza Virus 2 NOT DETECTED NOT DETECTED Final   Parainfluenza Virus 3 NOT DETECTED NOT DETECTED Final   Parainfluenza Virus 4 NOT DETECTED NOT DETECTED Final   Respiratory Syncytial Virus NOT DETECTED NOT DETECTED Final   Bordetella pertussis  NOT DETECTED NOT DETECTED Final   Chlamydophila pneumoniae NOT DETECTED NOT DETECTED Final   Mycoplasma pneumoniae NOT DETECTED NOT DETECTED Final  MRSA PCR Screening     Status: None   Collection Time: 11/13/18  2:55 AM  Result Value Ref Range Status   MRSA by PCR NEGATIVE NEGATIVE Final    Comment:        The GeneXpert MRSA Assay (FDA approved for NASAL specimens only), is one component of a comprehensive MRSA colonization surveillance program. It is not intended to diagnose MRSA infection nor to guide or monitor treatment for MRSA infections. Performed at [facility], [place] [address] [place], [place] [address]      Time coordinating discharge:  I have spent 35 minutes face to face with the patient and on the ward discussing the patients care, assessment, plan and disposition with other care givers. >50% of the time was devoted counseling the patient about the risks and benefits of treatment/Discharge disposition and coordinating care.   SIGNED:   Damita Lack, MD  [facility] 11/16/2018, 10:44 AM   If 7PM-7AM, please contact night-coverage [email or web address]

## 2018-11-16 NOTE — Care Management Important Message (Signed)
Important Message  Patient Details  Name: Nicholas Caldwell MRN: [id number] Date of Birth: 02/26/1946   Medicare Important Message Given:  Yes    Deannah Rossi [address] 11/16/2018, 3:32 PM

## 2018-11-16 NOTE — Progress Notes (Signed)
ANTICOAGULATION CONSULT NOTE - Follow up  Pharmacy Consult for Xarelto Indication: h/o  atrial fibrillation  No Known Allergies  Patient Measurements: Height: 6' (182.9 cm) Weight: 284 lb 13.4 oz (129.2 kg) IBW/kg (Calculated) : 77.6  Vital Signs: Temp: 98.9 F (37.2 C) (01/14 0336) Temp Source: Axillary (01/14 0336) BP: 97/50 (01/14 0816) Pulse Rate: 80 (01/14 0816)  Labs: Recent Labs    11/14/18 0557 11/15/18 0122 11/16/18 0237  HGB  --   --  8.1*  HCT  --   --  28.3*  PLT  --   --  338  CREATININE 1.10 1.06 1.13    Estimated Creatinine Clearance: 84.5 mL/min (by C-G formula based on SCr of 1.13 mg/dL).   Medical History: Past Medical History:  Diagnosis Date  . Atrial flutter ([place])    a. recurrent AFlutter with RVR;  b. Amiodarone Rx started 4/16  . CAD (coronary artery disease)    a. LHC 1/16:  mLAD diffuse disease, pLCx mild disease, dLCx with disease but too small for PCI, RCA ok, EF 25-30%  . Chronic pain   . Chronic systolic CHF (congestive heart failure) ([place])   . COPD (chronic obstructive pulmonary disease) ([place])   . Diabetes mellitus without complication ([place])   . Hypercholesteremia   . Hypertension   . NICM (nonischemic cardiomyopathy) ([place])    a.dx 2016. b. 2D echo 06/2016 - Last echo 07/01/16: mod dilated LV, mod LVH, EF 25-30%, mild-mod MR, sev LAE, mild-mod reduced RV systolic function, mild-mod TR, PASP 38mHG.  .Marland KitchenPAF (paroxysmal atrial fibrillation) ([facility])    On amio - ot a candidate for flecainide due to cardiomyopathy, not a candidate for Tikosyn due to prolonged QT, and felt to be a poor candidate for ablation given left atrial size.  . Pulmonary hypertension ([place]   . Tobacco abuse     Medications:  See electronic med rec  Assessment: 73y.o. M presents with respiratory failure. Pt on Xarelto PTA for h/o  afib. Per his wife,  pt took last PTA dose Xarelto 1/10 with supper. Pharmacy consulted to dose Xarelto. Hgb 9.1 (baseline ~9 over past  few months) >10.3>8.1.  SCr 1.13, Crcl 84 ml/min, normal renal function . Xarelto dose remains appropriate.  No bleeding noted.  Goal of Therapy:  Prevention of CVA Monitor platelets by anticoagulation protocol: Yes   Plan:  Continue Xarelto 246mdaily with supper Will f/u CBC, monitor for s/s of bleeding and renal function.   RuNicole CellaRPh Clinical pharmacist [phone]*Pharmacist phone directory can now be found on amCambridgeom ([email or web address]).  Listed under MCSierra Brooks1/14/2020,10:10 AM

## 2018-11-16 NOTE — Consult Note (Signed)
   [facility] CM Inpatient Consult   11/16/2018  Nicholas Caldwell September 04, 1946 [id number]  Patient assess for needs.  Patient's primary care provider is the [facility] in [place]. This primary care provider is not a [facility] affiliate. Patient for home with home health.  Natividad Brood, RN BSN [facility] Liaison  [phone] business mobile phone Toll free office [phone]

## 2018-11-16 NOTE — Care Management Note (Addendum)
Case Management Note  Patient Details  Name: Reason Helzer MRN: [id number] Date of Birth: November 21, 1945  Subjective/Objective:   From home with wife, for dc home today, will need HHPT, NCM offered choice from Medicare.gov list she chose [facility], referral made with Cassie with [facility], Soc will begin 24-48 hrs post dc.   Ben RN states patient will need oxygen tank to go home with, wife does not have it with her.  Patient has oxygen with [facility], NCM contacted Jermaine with [facility] to bring oxygen to patient room.               Action/Plan: DC home when ready.  Expected Discharge Date:  11/16/18               Expected Discharge Plan:  [place]  In-House Referral:     Discharge planning Services  CM Consult  Post Acute Care Choice:  Home Health Choice offered to:  Spouse  DME Arranged:    DME Agency:     HH Arranged:  PT HH Agency:  [facility]  Status of Service:  Completed, signed off  If discussed at [place] of Stay Meetings, dates discussed:    Additional Comments:  Zenon Mayo, RN 11/16/2018, 10:50 AM

## 2018-11-16 NOTE — Evaluation (Signed)
Physical Therapy Evaluation Patient Details Name: Nicholas Caldwell MRN: [id number] DOB: 1946/05/19 Today's Date: 11/16/2018   History of Present Illness  Patient is a 73 y.o male presenting to the ED on 11/12/18 with primary complaints of fatigue/generalized weakness. Admitted for Acute on chronic respiratory failure with hypoxia and hypercapnia. PMH significant for AFlutter, CAD, CHF, COPD, DM, HTN, PAF, pulm HTN.     Clinical Impression  Mr. Mawson admitted with the above listed diagnosis. Patient reports Mod I with mobility prior to admission. Patient today requiring Min guard for all transfers and mobility with RW as well as use of 4L supplemental O2. At rest patient SpO2 90-93%, with mobility desat to 85-89% - cueing for breathing patterns upon sitting with good return of SpO2 >/= 90%. Will recommend HHPT at discharge to ensure safe and independent functional mobility. PT to follow acutely.      Follow Up Recommendations Home health PT;Supervision - Intermittent    Equipment Recommendations  None recommended by PT    Recommendations for Other Services       Precautions / Restrictions Precautions Precautions: Fall Restrictions Weight Bearing Restrictions: No      Mobility  Bed Mobility Overal bed mobility: Needs Assistance Bed Mobility: Supine to Sit;Sit to Supine     Supine to sit: Supervision Sit to supine: Supervision   General bed mobility comments: increased time and effort - use of bed rails  Transfers Overall transfer level: Needs assistance Equipment used: Rolling walker (2 wheeled) Transfers: Sit to/from Stand Sit to Stand: Min guard         General transfer comment: for safety and immediate standing balance  Ambulation/Gait Ambulation/Gait assistance: Min guard Gait Distance (Feet): 120 Feet(1 standing rest break) Assistive device: Rolling walker (2 wheeled) Gait Pattern/deviations: Step-through pattern;Decreased stride length;Trunk flexed Gait  velocity: decreased   General Gait Details: use of 4L via [place] - easily fatigued vs back pain limiting mobility; cueing for safety with AD  Stairs            Wheelchair Mobility    Modified Rankin (Stroke Patients Only)       Balance Overall balance assessment: Needs assistance Sitting-balance support: No upper extremity supported;Feet supported Sitting balance-Leahy Scale: Good     Standing balance support: During functional activity;Bilateral upper extremity supported Standing balance-Leahy Scale: Poor Standing balance comment: reliant on UE support                             Pertinent Vitals/Pain Pain Assessment: 0-10 Pain Score: 7  Pain Location: R hip Pain Descriptors / Indicators: Aching;Discomfort;Grimacing Pain Intervention(s): Limited activity within patient's tolerance;Monitored during session;Repositioned    Home Living Family/patient expects to be discharged to:: Private residence Living Arrangements: Spouse/significant other   Type of Home: House Home Access: Stairs to enter Entrance Stairs-Rails: None Entrance Stairs-Number of Steps: 1 Home Layout: One level Home Equipment: Environmental consultant - 2 wheels;Cane - single point;Bedside commode;Shower seat(oxygen - 3L )      Prior Function Level of Independence: Needs assistance   Gait / Transfers Assistance Needed: uses RW for mobility  ADL's / Homemaking Assistance Needed: assist from wife for housework, meals        Hand Dominance   Dominant Hand: Right    Extremity/Trunk Assessment   Upper Extremity Assessment Upper Extremity Assessment: Defer to OT evaluation    Lower Extremity Assessment Lower Extremity Assessment: Generalized weakness    Cervical / Trunk Assessment Cervical /  Trunk Assessment: Normal  Communication   Communication: No difficulties  Cognition Arousal/Alertness: Awake/alert Behavior During Therapy: WFL for tasks assessed/performed Overall Cognitive Status:  Within Functional Limits for tasks assessed                                        General Comments General comments (skin integrity, edema, etc.): family present and supportive    Exercises     Assessment/Plan    PT Assessment Patient needs continued PT services  PT Problem List Decreased strength;Decreased activity tolerance;Decreased balance;Decreased mobility;Decreased knowledge of use of DME;Decreased safety awareness       PT Treatment Interventions DME instruction;Gait training;Functional mobility training;Therapeutic activities;Therapeutic exercise;Balance training;Patient/family education    PT Goals (Current goals can be found in the Care Plan section)  Acute Rehab PT Goals Patient Stated Goal: return home today PT Goal Formulation: With patient Time For Goal Achievement: 11/30/18 Potential to Achieve Goals: Good    Frequency Min 3X/week   Barriers to discharge        Co-evaluation               AM-PAC PT "6 Clicks" Mobility  Outcome Measure Help needed turning from your back to your side while in a flat bed without using bedrails?: A Little Help needed moving from lying on your back to sitting on the side of a flat bed without using bedrails?: A Little Help needed moving to and from a bed to a chair (including a wheelchair)?: A Little Help needed standing up from a chair using your arms (e.g., wheelchair or bedside chair)?: A Little Help needed to walk in hospital room?: A Little Help needed climbing 3-5 steps with a railing? : A Lot 6 Click Score: 17    End of Session Equipment Utilized During Treatment: Gait belt;Oxygen Activity Tolerance: Patient tolerated treatment well Patient left: in bed;with call bell/phone within reach;with family/visitor present Nurse Communication: Mobility status PT Visit Diagnosis: Unsteadiness on feet (R26.81);Other abnormalities of gait and mobility (R26.89);Muscle weakness (generalized) (M62.81)     Time: 1155-2080 PT Time Calculation (min) (ACUTE ONLY): 29 min   Charges:   PT Evaluation $PT Eval Moderate Complexity: 1 Mod PT Treatments $Gait Training: 8-22 mins     Lanney Gins, PT, DPT Supplemental Physical Therapist 11/16/18 9:31 AM Pager: [phone] Office: [phone]

## 2018-11-21 ENCOUNTER — Encounter (HOSPITAL_COMMUNITY): Payer: Self-pay | Admitting: Emergency Medicine

## 2018-11-21 ENCOUNTER — Inpatient Hospital Stay (HOSPITAL_COMMUNITY)
Admission: EM | Admit: 2018-11-21 | Discharge: 2018-11-26 | DRG: 291 | Disposition: A | Discharge status: Home Health | Payer: Medicare Other | Attending: Internal Medicine | Admitting: Internal Medicine

## 2018-11-21 ENCOUNTER — Emergency Department (HOSPITAL_COMMUNITY): Payer: Medicare Other

## 2018-11-21 DIAGNOSIS — Z8249 Family history of ischemic heart disease and other diseases of the circulatory system: Secondary | ICD-10-CM

## 2018-11-21 DIAGNOSIS — I110 Hypertensive heart disease with heart failure: Principal | ICD-10-CM | POA: Diagnosis present

## 2018-11-21 DIAGNOSIS — F17210 Nicotine dependence, cigarettes, uncomplicated: Secondary | ICD-10-CM | POA: Diagnosis present

## 2018-11-21 DIAGNOSIS — E876 Hypokalemia: Secondary | ICD-10-CM | POA: Diagnosis not present

## 2018-11-21 DIAGNOSIS — J9621 Acute and chronic respiratory failure with hypoxia: Secondary | ICD-10-CM | POA: Diagnosis not present

## 2018-11-21 DIAGNOSIS — Z9114 Patient's other noncompliance with medication regimen: Secondary | ICD-10-CM

## 2018-11-21 DIAGNOSIS — I4510 Unspecified right bundle-branch block: Secondary | ICD-10-CM | POA: Diagnosis present

## 2018-11-21 DIAGNOSIS — J9622 Acute and chronic respiratory failure with hypercapnia: Secondary | ICD-10-CM | POA: Diagnosis present

## 2018-11-21 DIAGNOSIS — J441 Chronic obstructive pulmonary disease with (acute) exacerbation: Secondary | ICD-10-CM | POA: Diagnosis present

## 2018-11-21 DIAGNOSIS — Z9981 Dependence on supplemental oxygen: Secondary | ICD-10-CM

## 2018-11-21 DIAGNOSIS — R001 Bradycardia, unspecified: Secondary | ICD-10-CM | POA: Diagnosis not present

## 2018-11-21 DIAGNOSIS — I5033 Acute on chronic diastolic (congestive) heart failure: Secondary | ICD-10-CM | POA: Diagnosis not present

## 2018-11-21 DIAGNOSIS — I4892 Unspecified atrial flutter: Secondary | ICD-10-CM | POA: Diagnosis not present

## 2018-11-21 DIAGNOSIS — I441 Atrioventricular block, second degree: Secondary | ICD-10-CM | POA: Diagnosis present

## 2018-11-21 DIAGNOSIS — E669 Obesity, unspecified: Secondary | ICD-10-CM | POA: Diagnosis present

## 2018-11-21 DIAGNOSIS — R0602 Shortness of breath: Secondary | ICD-10-CM | POA: Diagnosis not present

## 2018-11-21 DIAGNOSIS — Z79899 Other long term (current) drug therapy: Secondary | ICD-10-CM

## 2018-11-21 DIAGNOSIS — R0902 Hypoxemia: Secondary | ICD-10-CM | POA: Diagnosis not present

## 2018-11-21 DIAGNOSIS — G4733 Obstructive sleep apnea (adult) (pediatric): Secondary | ICD-10-CM | POA: Diagnosis present

## 2018-11-21 DIAGNOSIS — Z7901 Long term (current) use of anticoagulants: Secondary | ICD-10-CM

## 2018-11-21 DIAGNOSIS — R0689 Other abnormalities of breathing: Secondary | ICD-10-CM | POA: Diagnosis not present

## 2018-11-21 DIAGNOSIS — E785 Hyperlipidemia, unspecified: Secondary | ICD-10-CM | POA: Diagnosis present

## 2018-11-21 DIAGNOSIS — Z9119 Patient's noncompliance with other medical treatment and regimen: Secondary | ICD-10-CM

## 2018-11-21 DIAGNOSIS — I272 Pulmonary hypertension, unspecified: Secondary | ICD-10-CM | POA: Diagnosis present

## 2018-11-21 DIAGNOSIS — J984 Other disorders of lung: Secondary | ICD-10-CM | POA: Diagnosis present

## 2018-11-21 DIAGNOSIS — K5900 Constipation, unspecified: Secondary | ICD-10-CM | POA: Diagnosis not present

## 2018-11-21 DIAGNOSIS — E873 Alkalosis: Secondary | ICD-10-CM | POA: Diagnosis present

## 2018-11-21 DIAGNOSIS — Z7951 Long term (current) use of inhaled steroids: Secondary | ICD-10-CM

## 2018-11-21 DIAGNOSIS — Z6839 Body mass index (BMI) 39.0-39.9, adult: Secondary | ICD-10-CM

## 2018-11-21 DIAGNOSIS — I428 Other cardiomyopathies: Secondary | ICD-10-CM | POA: Diagnosis present

## 2018-11-21 DIAGNOSIS — Z794 Long term (current) use of insulin: Secondary | ICD-10-CM

## 2018-11-21 DIAGNOSIS — E7800 Pure hypercholesterolemia, unspecified: Secondary | ICD-10-CM | POA: Diagnosis present

## 2018-11-21 DIAGNOSIS — Z91199 Patient's noncompliance with other medical treatment and regimen due to unspecified reason: Secondary | ICD-10-CM

## 2018-11-21 DIAGNOSIS — I4891 Unspecified atrial fibrillation: Secondary | ICD-10-CM | POA: Diagnosis not present

## 2018-11-21 DIAGNOSIS — Z9081 Acquired absence of spleen: Secondary | ICD-10-CM

## 2018-11-21 DIAGNOSIS — I5043 Acute on chronic combined systolic (congestive) and diastolic (congestive) heart failure: Secondary | ICD-10-CM | POA: Diagnosis present

## 2018-11-21 DIAGNOSIS — R062 Wheezing: Secondary | ICD-10-CM | POA: Diagnosis not present

## 2018-11-21 DIAGNOSIS — I509 Heart failure, unspecified: Secondary | ICD-10-CM

## 2018-11-21 DIAGNOSIS — I480 Paroxysmal atrial fibrillation: Secondary | ICD-10-CM | POA: Diagnosis present

## 2018-11-21 DIAGNOSIS — I2510 Atherosclerotic heart disease of native coronary artery without angina pectoris: Secondary | ICD-10-CM | POA: Diagnosis present

## 2018-11-21 DIAGNOSIS — E119 Type 2 diabetes mellitus without complications: Secondary | ICD-10-CM | POA: Diagnosis present

## 2018-11-21 DIAGNOSIS — I472 Ventricular tachycardia: Secondary | ICD-10-CM | POA: Diagnosis not present

## 2018-11-21 DIAGNOSIS — J9811 Atelectasis: Secondary | ICD-10-CM | POA: Diagnosis present

## 2018-11-21 DIAGNOSIS — J9611 Chronic respiratory failure with hypoxia: Secondary | ICD-10-CM | POA: Diagnosis present

## 2018-11-21 DIAGNOSIS — G8929 Other chronic pain: Secondary | ICD-10-CM | POA: Diagnosis present

## 2018-11-21 DIAGNOSIS — I5022 Chronic systolic (congestive) heart failure: Secondary | ICD-10-CM | POA: Diagnosis present

## 2018-11-21 DIAGNOSIS — I10 Essential (primary) hypertension: Secondary | ICD-10-CM | POA: Diagnosis present

## 2018-11-21 LAB — I-STAT ARTERIAL BLOOD GAS, ED
Acid-Base Excess: 21 mmol/L — ABNORMAL HIGH (ref 0.0–2.0)
Bicarbonate: 49.8 mmol/L — ABNORMAL HIGH (ref 20.0–28.0)
O2 Saturation: 91 %
PO2 ART: 68 mmHg — AB (ref 83.0–108.0)
Patient temperature: 98.6
TCO2: >50 mmol/L — ABNORMAL HIGH (ref 22–32)
pCO2 arterial: 82.8 mmHg (ref 32.0–48.0)
pH, Arterial: 7.387 (ref 7.350–7.450)

## 2018-11-21 LAB — BASIC METABOLIC PANEL
Anion gap: 9 (ref 5–15)
BUN: 14 mg/dL (ref 8–23)
CO2: 41 mmol/L — ABNORMAL HIGH (ref 22–32)
Calcium: 8 mg/dL — ABNORMAL LOW (ref 8.9–10.3)
Chloride: 88 mmol/L — ABNORMAL LOW (ref 98–111)
Creatinine, Ser: 0.91 mg/dL (ref 0.61–1.24)
GFR calc Af Amer: >60 mL/min (ref >60)
GFR calc non Af Amer: >60 mL/min (ref >60)
GLUCOSE: 131 mg/dL — AB (ref 70–99)
Potassium: 4.1 mmol/L (ref 3.5–5.1)
Sodium: 138 mmol/L (ref 135–145)

## 2018-11-21 LAB — BRAIN NATRIURETIC PEPTIDE: B Natriuretic Peptide: 1739.7 pg/mL — ABNORMAL HIGH (ref 0.0–100.0)

## 2018-11-21 LAB — CBC WITH DIFFERENTIAL/PLATELET
Abs Immature Granulocytes: 0.03 10*3/uL (ref 0.00–0.07)
Basophils Absolute: 0 10*3/uL (ref 0.0–0.1)
Basophils Relative: 0 %
Eosinophils Absolute: 0.2 10*3/uL (ref 0.0–0.5)
Eosinophils Relative: 3 %
HCT: 31.7 % — ABNORMAL LOW (ref 39.0–52.0)
Hemoglobin: 8.6 g/dL — ABNORMAL LOW (ref 13.0–17.0)
Immature Granulocytes: 0 %
Lymphocytes Relative: 12 %
Lymphs Abs: 0.8 10*3/uL (ref 0.7–4.0)
MCH: 25.4 pg — ABNORMAL LOW (ref 26.0–34.0)
MCHC: 27.1 g/dL — ABNORMAL LOW (ref 30.0–36.0)
MCV: 93.8 fL (ref 80.0–100.0)
MONOS PCT: 13 %
Monocytes Absolute: 0.9 10*3/uL (ref 0.1–1.0)
Neutro Abs: 5 10*3/uL (ref 1.7–7.7)
Neutrophils Relative %: 72 %
Platelets: 376 10*3/uL (ref 150–400)
RBC: 3.38 MIL/uL — ABNORMAL LOW (ref 4.22–5.81)
RDW: 17.2 % — ABNORMAL HIGH (ref 11.5–15.5)
WBC: 6.9 10*3/uL (ref 4.0–10.5)
nRBC: 0.3 % — ABNORMAL HIGH (ref 0.0–0.2)

## 2018-11-21 LAB — I-STAT TROPONIN, ED: Troponin i, poc: 0.02 ng/mL (ref 0.00–0.08)

## 2018-11-21 MED ORDER — FUROSEMIDE 10 MG/ML IJ SOLN
80.0000 mg | INTRAMUSCULAR | Status: AC
  Administered 2018-11-21: 80 mg via INTRAVENOUS
  Filled 2018-11-21: qty 8

## 2018-11-21 MED ORDER — ALBUTEROL SULFATE (2.5 MG/3ML) 0.083% IN NEBU
5.0000 mg | INHALATION_SOLUTION | Freq: Once | RESPIRATORY_TRACT | Status: AC
  Administered 2018-11-21: 5 mg via RESPIRATORY_TRACT
  Filled 2018-11-21: qty 6

## 2018-11-21 NOTE — ED Triage Notes (Signed)
Per EMS, pt from home, sob, 89% on home 3L O2.  Given 10 albuterol, 1 Atrovent and 125 solumedrol.  Now at 100% on neb.  Wheezing and rhonci in all lobes.  117/63, A/O

## 2018-11-21 NOTE — ED Provider Notes (Signed)
[facility] EMERGENCY DEPARTMENT Provider Note   CSN: [id number] Arrival date & time: 11/21/18  2213     History   Chief Complaint Chief Complaint  Patient presents with  . Shortness of Breath    HPI Nicholas Caldwell is a 73 y.o. male.  The history is provided by the patient and medical records.  Shortness of Breath     73 y.o. M with hx of CAD, atrial flutter/PAF, CHF, COPD requiring home O2, HTN, HLP, pulmonary HTN, presenting to the ED for SOB.  States he has been feeling this way for about a half hour.  EMS was called, found to have sats around 89% on 3L which is baseline.  He was given 83m albuterol, 0.582matrovent, and 12558molu-medrol on the way here with some improvement.  Patient reports chronic cough but no fever/chills.  No sick contacts.  Recently hospitalized for same.    Past Medical History:  Diagnosis Date  . Atrial flutter (HCCPunxsutaw[place]  a. recurrent AFlutter with RVR;  b. Amiodarone Rx started 4/16  . CAD (coronary artery disease)    a. LHC 1/16:  mLAD diffuse disease, pLCx mild disease, dLCx with disease but too small for PCI, RCA ok, EF 25-30%  . Chronic pain   . Chronic systolic CHF (congestive heart failure) (HCC[place] . COPD (chronic obstructive pulmonary disease) (HCC[place] . Diabetes mellitus without complication (HCC[place] . Hypercholesteremia   . Hypertension   . NICM (nonischemic cardiomyopathy) ([place]CC[place]  a.dx 2016. b. 2D echo 06/2016 - Last echo 07/01/16: mod dilated LV, mod LVH, EF 25-30%, mild-mod MR, sev LAE, mild-mod reduced RV systolic function, mild-mod TR, PASP 24m24m  . PAM[place]land Kitchen (paroxysmal atrial fibrillation) (HCC)    On amio - ot a candidate for flecainide due to cardiomyopathy, not a candidate for Tikosyn due to prolonged QT, and felt to be a poor candidate for ablation given left atrial size.  . Pulmonary hypertension (HCC)[place]. Tobacco abuse     Patient Active Problem List   Diagnosis Date Noted  . Acute on chronic respiratory failure  with hypoxia and hypercapnia (HCC)[place]/09/2019  . Metabolic encephalopathy 01/199/24/2683Acute respiratory failure with hypoxia and hypercapnia (HCC)[place]/23/2019  . Acute exacerbation of CHF (congestive heart failure) (HCC)[place]/23/2019  . CHF exacerbation (HCC)[place]/29/2019  . CHF (congestive heart failure) (HCC)[place]/28/2019  . Chronic respiratory failure with hypoxia (HCC)[place]/25/2019  . Atrial fibrillation with RVR (HCC)[place]. SVT (supraventricular tachycardia) (HCC)[place]. COPD GOLD 0   . Medically noncompliant   . Panlobular emphysema (HCC)[place]. OSA (obstructive sleep apnea)   . Pulmonary hypertension (HCC)[place]. Disorientation   . Pressure injury of skin 09/07/2016  . Skin lesion-left heal 09/05/2016  . Chronic systolic CHF (congestive heart failure) (HCC)[place]/13/2017  . COPD (chronic obstructive pulmonary disease) (HCC)[place]/13/2017  . Uncontrolled type 2 diabetes mellitus with complication (HCC)[place]. Diabetic polyneuropathy associated with diabetes mellitus due to underlying condition (HCC)[place]. Normocytic anemia 06/29/2016  . CAD - Non-obstructive by LHC1/16 01/21/2016  . Nonischemic cardiomyopathy (HCC)[place]/04/2015  . PAF (paroxysmal atrial fibrillation) (HCC)[place]. Chronic pain 02/19/2015  . Morbid obesity (HCC)[place]/10/2015  . DOE (dyspnea on exertion)   . Elevated troponin I level 11/01/2014  . COPD exacerbation (HCC)[place]/30/2015  . Essential hypertension 10/31/2014  . Type 2 diabetes mellitus with  neuropathy 10/31/2014  . Hyperlipidemia  10/31/2014  . Cigarette smoker 10/31/2014    Past Surgical History:  Procedure Laterality Date  . CARDIOVERSION N/A 07/30/2018   Procedure: CARDIOVERSION;  Surgeon: Larey Dresser, MD;  Location: [facility] ENDOSCOPY;  Service: Cardiovascular;  Laterality: N/A;  . LEFT AND RIGHT HEART CATHETERIZATION WITH CORONARY ANGIOGRAM N/A 11/06/2014   Procedure: LEFT AND RIGHT HEART CATHETERIZATION WITH CORONARY ANGIOGRAM;  Surgeon: Jettie Booze, MD;  Location[facility]  CATH LAB;  Service: Cardiovascular;  Laterality: N/A;  . SPLENECTOMY          Home Medications    Prior to Admission medications   Medication Sig Start Date End Date Taking? Authorizing Provider  albuterol (PROVENTIL HFA;VENTOLIN HFA) 108 (90 BASE) MCG/ACT inhaler Inhale 2 puffs into the lungs every 6 (six) hours as needed for wheezing or shortness of breath. Patient not taking: Reported on 11/14/2018 11/07/14   Kinnie Feil, MD  budesonide-formoterol [facility]) 160-4.5 MCG/ACT inhaler Inhale 2 puffs into the lungs 2 (two) times daily.    [provider]  carvedilol (COREG) 6.25 MG tablet Take 1 tablet (6.25 mg total) by mouth 2 (two) times daily with a meal. 08/02/18   Regalado, Belkys A, MD  diazepam (VALIUM) 10 MG tablet Take 0.5 tablets (5 mg total) by mouth daily as needed for anxiety or sleep. Patient taking differently: Take 10 mg by mouth daily as needed for anxiety or sleep.  12/19/16   Theodis Blaze, MD  ferrous sulfate 325 (65 FE) MG tablet Take 325 mg by mouth 3 (three) times daily as needed (for supplementation).    [provider]  fluticasone (FLONASE) 50 MCG/ACT nasal spray Place 2 sprays into both nostrils daily as needed for allergies.     [provider]  folic acid (FOLVITE) 1 MG tablet Take 1 mg by mouth daily.    [provider]  gabapentin (NEURONTIN) 600 MG tablet Take 1 tablet (600 mg total) by mouth 2 (two) times daily. 12/19/16   Theodis Blaze, MD  glucose 4 GM chewable tablet Chew 4 tablets by mouth See admin instructions. Chew 4 tablets by mouth as needed for low blood sugar - repeat every 15 minutes if blood sugar less than 70    [provider]  guaifenesin (HUMIBID E) 400 MG TABS tablet Take 1 tablet (400 mg total) by mouth every 6 (six) hours as needed. 08/02/18   Regalado, Belkys A, MD  hydrocortisone (ANUSOL-HC) 2.5 % rectal cream Place 1 application rectally 2 (two) times daily as needed for hemorrhoids or  itching.     [provider]  hydrocortisone 1 % lotion Apply 1 application topically See admin instructions. Apply small amount to back twice daily for itchy rash    [provider]  insulin aspart protamine - aspart (NOVOLOG MIX 70/30 FLEXPEN) (70-30) 100 UNIT/ML FlexPen Inject 0.05 mLs (5 Units total) into the skin 2 (two) times daily. Patient taking differently: Inject 30 Units into the skin 2 (two) times daily.  08/02/18   Regalado, Belkys A, MD  ipratropium-albuterol (DUONEB) 0.5-2.5 (3) MG/3ML SOLN Take 3 mLs by nebulization 2 (two) times daily.    [provider]  latanoprost (XALATAN) 0.005 % ophthalmic solution Place 1 drop into both eyes at bedtime.    [provider]  lisinopril (PRINIVIL,ZESTRIL) 20 MG tablet Take 1 tablet (20 mg total) by mouth daily. 05/27/18   Georgiana Shore, NP  Magnesium Oxide 420 (252 Mg) MG TABS  Take 420 mg by mouth 2 (two) times daily.    [provider]  metFORMIN (GLUCOPHAGE-XR) 500 MG 24 hr tablet Take 1,000 mg by mouth 2 (two) times daily with a meal.    [provider]  oxyCODONE-acetaminophen (PERCOCET) 10-325 MG tablet Take 1 tablet by mouth every 6 (six) hours as needed for pain.    [provider]  OXYGEN Inhale 3 L into the lungs continuous.     [provider]  potassium chloride SA (K-DUR,KLOR-CON) 20 MEQ tablet Take 2 tablets (40 mEq total) by mouth daily. 11/07/14   Kinnie Feil, MD  rivaroxaban (XARELTO) 20 MG TABS tablet Take 1 tablet (20 mg total) by mouth daily with supper. Patient taking differently: Take 20 mg by mouth daily at 6 PM.  01/20/17   Jettie Booze, MD  rosuvastatin (CRESTOR) 20 MG tablet Take 1 tablet (20 mg total) by mouth daily. 05/07/18   Matilde Haymaker, MD  spironolactone (ALDACTONE) 25 MG tablet Take 1 tablet (25 mg total) by mouth at bedtime. 02/04/18 02/04/19  Shirley Friar, PA-C  torsemide (DEMADEX) 20 MG tablet Take 3 tablets (60 mg total) by  mouth 2 (two) times daily. 08/02/18   Regalado, Cassie Freer, MD    Family History Family History  Problem Relation Age of Onset  . Heart disease Mother   . Hypertension Mother   . Heart failure Mother   . Heart disease Father   . Hypertension Sister   . Heart attack Neg Hx   . Stroke Neg Hx     Social History Social History   Tobacco Use  . Smoking status: Current Every Day Smoker    Packs/day: 1.00    Years: 34.00    Pack years: 34.00    Types: Cigarettes  . Smokeless tobacco: Never Used  Substance Use Topics  . Alcohol use: No    Alcohol/week: 0.0 standard drinks  . Drug use: No     Allergies   Patient has no known allergies.   Review of Systems Review of Systems  Respiratory: Positive for shortness of breath.   All other systems reviewed and are negative.    Physical Exam Updated Vital Signs Pulse (!) 58   Temp 98.8 F (37.1 C) (Oral)   Resp 20   SpO2 93%   Physical Exam Vitals signs and nursing note reviewed.  Constitutional:      Appearance: He is well-developed.  HENT:     Head: Normocephalic and atraumatic.  Eyes:     Conjunctiva/sclera: Conjunctivae normal.     Pupils: Pupils are equal, round, and reactive to light.  Neck:     Musculoskeletal: Normal range of motion.  Cardiovascular:     Rate and Rhythm: Normal rate and regular rhythm.     Heart sounds: Normal heart sounds.  Pulmonary:     Effort: Pulmonary effort is normal.     Breath sounds: Wheezing and rhonchi present.     Comments: Breath sounds with intermixed wheezes and rhonchi throughout, NAD, O2 sats 95% on baseline 3L Abdominal:     General: Bowel sounds are normal.     Palpations: Abdomen is soft.  Musculoskeletal: Normal range of motion.  Skin:    General: Skin is warm and dry.  Neurological:     Comments: Awake but seems a little somnolent and falling asleep during exam, is able to answer questions and follow commands appropriately when prompted      ED Treatments /  Results  Labs (all labs ordered are listed, but only abnormal results are displayed) Labs Reviewed  CBC WITH DIFFERENTIAL/PLATELET - Abnormal; Notable for the following components:      Result Value   RBC 3.38 (*)    Hemoglobin 8.6 (*)    HCT 31.7 (*)    MCH 25.4 (*)    MCHC 27.1 (*)    RDW 17.2 (*)    nRBC 0.3 (*)    All other components within normal limits  BASIC METABOLIC PANEL - Abnormal; Notable for the following components:   Chloride 88 (*)    CO2 41 (*)    Glucose, Bld 131 (*)    Calcium 8.0 (*)    All other components within normal limits  BRAIN NATRIURETIC PEPTIDE - Abnormal; Notable for the following components:   B Natriuretic Peptide 1,739.7 (*)    All other components within normal limits  I-STAT ARTERIAL BLOOD GAS, ED - Abnormal; Notable for the following components:   pCO2 arterial 82.8 (*)    pO2, Arterial 68.0 (*)    Bicarbonate 49.8 (*)    TCO2 >50 (*)    Acid-Base Excess 21.0 (*)    All other components within normal limits  I-STAT ARTERIAL BLOOD GAS, ED - Abnormal; Notable for the following components:   pCO2 arterial 75.6 (*)    pO2, Arterial 58.0 (*)    Bicarbonate 48.1 (*)    TCO2 >50 (*)    Acid-Base Excess 20.0 (*)    All other components within normal limits  BASIC METABOLIC PANEL  CBC  I-STAT TROPONIN, ED    EKG EKG Interpretation  Date/Time:  Sunday November 21 2018 22:32:40 EST Ventricular Rate:  61 PR Interval:    QRS Duration: 113 QT Interval:  447 QTC Calculation: 451 R Axis:   100 Text Interpretation:  Sinus rhythm Probable left atrial enlargement Incomplete right bundle branch block Low voltage, precordial leads Nonspecific T abnormalities, lateral leads Since last EKG, sinus rhythm has replaced atrial flutter Non-specific ST-t changes Confirmed by Duffy Bruce [phone]) on 11/21/2018 11:56:17 PM   Radiology Dg Chest Portable 1 View  Result Date: 11/21/2018 CLINICAL DATA:  sob, 89% on home 3L O2. Given 10 albuterol, 1  Atrovent and 125 solumedrol. Now at 100% on neb. Wheezing and rhonci in all lobes. sob EXAM: PORTABLE CHEST 1 VIEW COMPARISON:  11/15/2010 FINDINGS: Enlarged cardiac silhouette. LEFT basilar atelectasis and effusion. Fine airspace disease. No interval change. IMPRESSION: No interval change in congestive heart failure pattern. Electronically Signed   By: Suzy Bouchard M.D.   On: 11/21/2018 23:16    Procedures Procedures (including critical care time)  CRITICAL CARE Performed by: Larene Pickett   Total critical care time: 45 minutes  Critical care time was exclusive of separately billable procedures and treating other patients.  Critical care was necessary to treat or prevent imminent or life-threatening deterioration.  Critical care was time spent personally by me on the following activities: development of treatment plan with patient and/or surrogate as well as nursing, discussions with consultants, evaluation of patient's response to treatment, examination of patient, obtaining history from patient or surrogate, ordering and performing treatments and interventions, ordering and review of laboratory studies, ordering and review of radiographic studies, pulse oximetry and re-evaluation of patient's condition.   Medications Ordered in ED Medications  albuterol (PROVENTIL) (2.5 MG/3ML) 0.083% nebulizer solution 5 mg (5 mg Nebulization Given 11/21/18 2251)  furosemide (LASIX) injection 80 mg (80 mg Intravenous Given 11/21/18 2359)  Initial Impression / Assessment and Plan / ED Course  I have reviewed the triage vital signs and the nursing notes.  Pertinent labs & imaging results that were available during my care of the patient were reviewed by me and considered in my medical decision making (see chart for details).  73 year old male here with shortness of breath.  Reports this began about 30 minutes prior to arrival.  Initially hypoxic into the 80s on his baseline 3 L with EMS,  improved after Solu-Medrol and nebs in route here.  Patient is awake but somewhat somnolent during my exam, falling asleep several times.  He does arouse to verbal and tactile stimuli and is able to follow commands and answer questions appropriately when prompted.  Breath sounds are junky with intermittent wheezes.  Patient with recent admission for COPD exacerbation with hypercapnia, concern for same.  Plan for screening labs, chest x-ray, ABG.  EKG is nonischemic.  Patient's initial labs as above-- trop negative, BNP 1700's, ABG with PCO2 of 82.8.  CXR with some continued CHF findings.  Patient started on bipap.  Last admission required ICU due to level of somnolence.  Will monitor on bipap for a bit and repeat ABG to determine appropriate admission placement.  Repeat ABG with PCO2 of 75.  Patient's somnolence improving.  Vitals remain stable.  Given improvement, feel he is stable for SDU.  Discussed with Dr. Shanon Brow-- will admit for ongoing care.  Final Clinical Impressions(s) / ED Diagnoses   Final diagnoses:  COPD exacerbation ([place])  Acute on chronic diastolic CHF (congestive heart failure) [facility])    ED Discharge Orders    None       Larene Pickett, PA-C 11/22/18 Ninfa Meeker    Duffy Bruce, MD 11/22/18 [phone]

## 2018-11-22 ENCOUNTER — Other Ambulatory Visit: Payer: Self-pay

## 2018-11-22 DIAGNOSIS — I428 Other cardiomyopathies: Secondary | ICD-10-CM | POA: Diagnosis present

## 2018-11-22 DIAGNOSIS — I5023 Acute on chronic systolic (congestive) heart failure: Secondary | ICD-10-CM | POA: Diagnosis not present

## 2018-11-22 DIAGNOSIS — G4733 Obstructive sleep apnea (adult) (pediatric): Secondary | ICD-10-CM | POA: Diagnosis present

## 2018-11-22 DIAGNOSIS — I5043 Acute on chronic combined systolic (congestive) and diastolic (congestive) heart failure: Secondary | ICD-10-CM | POA: Diagnosis present

## 2018-11-22 DIAGNOSIS — E7800 Pure hypercholesterolemia, unspecified: Secondary | ICD-10-CM | POA: Diagnosis present

## 2018-11-22 DIAGNOSIS — I2510 Atherosclerotic heart disease of native coronary artery without angina pectoris: Secondary | ICD-10-CM | POA: Diagnosis present

## 2018-11-22 DIAGNOSIS — J9622 Acute and chronic respiratory failure with hypercapnia: Secondary | ICD-10-CM | POA: Diagnosis present

## 2018-11-22 DIAGNOSIS — J9621 Acute and chronic respiratory failure with hypoxia: Secondary | ICD-10-CM | POA: Diagnosis present

## 2018-11-22 DIAGNOSIS — I472 Ventricular tachycardia: Secondary | ICD-10-CM | POA: Diagnosis not present

## 2018-11-22 DIAGNOSIS — Z9114 Patient's other noncompliance with medication regimen: Secondary | ICD-10-CM | POA: Diagnosis not present

## 2018-11-22 DIAGNOSIS — E873 Alkalosis: Secondary | ICD-10-CM | POA: Diagnosis present

## 2018-11-22 DIAGNOSIS — E785 Hyperlipidemia, unspecified: Secondary | ICD-10-CM | POA: Diagnosis present

## 2018-11-22 DIAGNOSIS — Z8249 Family history of ischemic heart disease and other diseases of the circulatory system: Secondary | ICD-10-CM | POA: Diagnosis not present

## 2018-11-22 DIAGNOSIS — E119 Type 2 diabetes mellitus without complications: Secondary | ICD-10-CM | POA: Diagnosis present

## 2018-11-22 DIAGNOSIS — I110 Hypertensive heart disease with heart failure: Secondary | ICD-10-CM | POA: Diagnosis present

## 2018-11-22 DIAGNOSIS — I4892 Unspecified atrial flutter: Secondary | ICD-10-CM | POA: Diagnosis present

## 2018-11-22 DIAGNOSIS — J441 Chronic obstructive pulmonary disease with (acute) exacerbation: Secondary | ICD-10-CM | POA: Diagnosis present

## 2018-11-22 DIAGNOSIS — I480 Paroxysmal atrial fibrillation: Secondary | ICD-10-CM | POA: Diagnosis present

## 2018-11-22 DIAGNOSIS — J9811 Atelectasis: Secondary | ICD-10-CM | POA: Diagnosis present

## 2018-11-22 DIAGNOSIS — F17210 Nicotine dependence, cigarettes, uncomplicated: Secondary | ICD-10-CM | POA: Diagnosis present

## 2018-11-22 DIAGNOSIS — Z9981 Dependence on supplemental oxygen: Secondary | ICD-10-CM | POA: Diagnosis not present

## 2018-11-22 DIAGNOSIS — J9611 Chronic respiratory failure with hypoxia: Secondary | ICD-10-CM | POA: Diagnosis not present

## 2018-11-22 DIAGNOSIS — I272 Pulmonary hypertension, unspecified: Secondary | ICD-10-CM | POA: Diagnosis present

## 2018-11-22 DIAGNOSIS — Z9081 Acquired absence of spleen: Secondary | ICD-10-CM | POA: Diagnosis not present

## 2018-11-22 DIAGNOSIS — G8929 Other chronic pain: Secondary | ICD-10-CM | POA: Diagnosis present

## 2018-11-22 DIAGNOSIS — Z9119 Patient's noncompliance with other medical treatment and regimen: Secondary | ICD-10-CM | POA: Diagnosis not present

## 2018-11-22 DIAGNOSIS — J9600 Acute respiratory failure, unspecified whether with hypoxia or hypercapnia: Secondary | ICD-10-CM | POA: Insufficient documentation

## 2018-11-22 LAB — CBC
HEMATOCRIT: 30.8 % — AB (ref 39.0–52.0)
Hemoglobin: 8.8 g/dL — ABNORMAL LOW (ref 13.0–17.0)
MCH: 26.5 pg (ref 26.0–34.0)
MCHC: 28.6 g/dL — ABNORMAL LOW (ref 30.0–36.0)
MCV: 92.8 fL (ref 80.0–100.0)
Platelets: 399 10*3/uL (ref 150–400)
RBC: 3.32 MIL/uL — ABNORMAL LOW (ref 4.22–5.81)
RDW: 17.2 % — ABNORMAL HIGH (ref 11.5–15.5)
WBC: 5.8 10*3/uL (ref 4.0–10.5)
nRBC: 0.3 % — ABNORMAL HIGH (ref 0.0–0.2)

## 2018-11-22 LAB — BASIC METABOLIC PANEL
Anion gap: 11 (ref 5–15)
BUN: 14 mg/dL (ref 8–23)
CHLORIDE: 90 mmol/L — AB (ref 98–111)
CO2: 42 mmol/L — ABNORMAL HIGH (ref 22–32)
CREATININE: 0.89 mg/dL (ref 0.61–1.24)
Calcium: 8.3 mg/dL — ABNORMAL LOW (ref 8.9–10.3)
GFR calc Af Amer: >60 mL/min (ref >60)
GFR calc non Af Amer: >60 mL/min (ref >60)
Glucose, Bld: 130 mg/dL — ABNORMAL HIGH (ref 70–99)
Potassium: 4.1 mmol/L (ref 3.5–5.1)
Sodium: 143 mmol/L (ref 135–145)

## 2018-11-22 LAB — I-STAT ARTERIAL BLOOD GAS, ED
Acid-Base Excess: 20 mmol/L — ABNORMAL HIGH (ref 0.0–2.0)
Bicarbonate: 48.1 mmol/L — ABNORMAL HIGH (ref 20.0–28.0)
O2 Saturation: 88 %
PO2 ART: 58 mmHg — AB (ref 83.0–108.0)
Patient temperature: 98.6
TCO2: >50 mmol/L — ABNORMAL HIGH (ref 22–32)
pCO2 arterial: 75.6 mmHg (ref 32.0–48.0)
pH, Arterial: 7.411 (ref 7.350–7.450)

## 2018-11-22 LAB — GLUCOSE, CAPILLARY: Glucose-Capillary: 322 mg/dL — ABNORMAL HIGH (ref 70–99)

## 2018-11-22 MED ORDER — ALBUTEROL SULFATE HFA 108 (90 BASE) MCG/ACT IN AERS: 2.0000 | INHALATION_SPRAY | Freq: Four times a day (QID) | RESPIRATORY_TRACT | Status: DC | PRN

## 2018-11-22 MED ORDER — IPRATROPIUM-ALBUTEROL 0.5-2.5 (3) MG/3ML IN SOLN
3.0000 mL | Freq: Four times a day (QID) | RESPIRATORY_TRACT | Status: DC
  Administered 2018-11-22 (×3): 3 mL via RESPIRATORY_TRACT
  Filled 2018-11-22 (×3): qty 3

## 2018-11-22 MED ORDER — SODIUM CHLORIDE 0.9% FLUSH
3.0000 mL | Freq: Two times a day (BID) | INTRAVENOUS | Status: DC
  Administered 2018-11-22 – 2018-11-25 (×7): 3 mL via INTRAVENOUS

## 2018-11-22 MED ORDER — METOPROLOL TARTRATE 5 MG/5ML IV SOLN
5.0000 mg | Freq: Four times a day (QID) | INTRAVENOUS | Status: DC
  Administered 2018-11-22 – 2018-11-23 (×5): 5 mg via INTRAVENOUS
  Filled 2018-11-22 (×5): qty 5

## 2018-11-22 MED ORDER — ROSUVASTATIN CALCIUM 20 MG PO TABS
20.0000 mg | ORAL_TABLET | Freq: Every day | ORAL | Status: DC
  Administered 2018-11-22 – 2018-11-26 (×5): 20 mg via ORAL
  Filled 2018-11-22 (×5): qty 1

## 2018-11-22 MED ORDER — INSULIN ASPART 100 UNIT/ML ~~LOC~~ SOLN
0.0000 [IU] | Freq: Every day | SUBCUTANEOUS | Status: DC
  Administered 2018-11-22: 4 [IU] via SUBCUTANEOUS
  Administered 2018-11-23: 3 [IU] via SUBCUTANEOUS
  Administered 2018-11-24 – 2018-11-25 (×2): 2 [IU] via SUBCUTANEOUS

## 2018-11-22 MED ORDER — INSULIN ASPART 100 UNIT/ML ~~LOC~~ SOLN
0.0000 [IU] | Freq: Three times a day (TID) | SUBCUTANEOUS | Status: DC
  Administered 2018-11-22: 2 [IU] via SUBCUTANEOUS
  Administered 2018-11-23: 3 [IU] via SUBCUTANEOUS
  Administered 2018-11-23: 5 [IU] via SUBCUTANEOUS
  Administered 2018-11-23: 2 [IU] via SUBCUTANEOUS
  Administered 2018-11-24: 3 [IU] via SUBCUTANEOUS
  Administered 2018-11-24: 2 [IU] via SUBCUTANEOUS
  Administered 2018-11-24: 5 [IU] via SUBCUTANEOUS
  Administered 2018-11-25 – 2018-11-26 (×4): 3 [IU] via SUBCUTANEOUS

## 2018-11-22 MED ORDER — LATANOPROST 0.005 % OP SOLN
1.0000 [drp] | Freq: Every day | OPHTHALMIC | Status: DC
  Administered 2018-11-22 – 2018-11-25 (×4): 1 [drp] via OPHTHALMIC
  Filled 2018-11-22: qty 2.5

## 2018-11-22 MED ORDER — CARVEDILOL 6.25 MG PO TABS
6.2500 mg | ORAL_TABLET | Freq: Two times a day (BID) | ORAL | Status: DC
  Filled 2018-11-22: qty 1

## 2018-11-22 MED ORDER — SPIRONOLACTONE 25 MG PO TABS: 25.0000 mg | ORAL_TABLET | Freq: Every day | ORAL | Status: DC

## 2018-11-22 MED ORDER — CARVEDILOL 6.25 MG PO TABS
6.2500 mg | ORAL_TABLET | Freq: Two times a day (BID) | ORAL | Status: DC
  Administered 2018-11-22 – 2018-11-26 (×6): 6.25 mg via ORAL
  Filled 2018-11-22 (×8): qty 1

## 2018-11-22 MED ORDER — ALBUTEROL SULFATE (2.5 MG/3ML) 0.083% IN NEBU
2.5000 mg | INHALATION_SOLUTION | Freq: Four times a day (QID) | RESPIRATORY_TRACT | Status: DC
  Administered 2018-11-22: 2.5 mg via RESPIRATORY_TRACT
  Filled 2018-11-22: qty 3

## 2018-11-22 MED ORDER — POTASSIUM CHLORIDE CRYS ER 20 MEQ PO TBCR
40.0000 meq | EXTENDED_RELEASE_TABLET | Freq: Every day | ORAL | Status: DC
  Filled 2018-11-22: qty 2

## 2018-11-22 MED ORDER — ALBUTEROL SULFATE (2.5 MG/3ML) 0.083% IN NEBU: 2.5000 mg | INHALATION_SOLUTION | Freq: Four times a day (QID) | RESPIRATORY_TRACT | Status: DC | PRN

## 2018-11-22 MED ORDER — ONDANSETRON HCL 4 MG/2ML IJ SOLN: 4.0000 mg | Freq: Four times a day (QID) | INTRAMUSCULAR | Status: DC | PRN

## 2018-11-22 MED ORDER — ONDANSETRON HCL 4 MG PO TABS: 4.0000 mg | ORAL_TABLET | Freq: Four times a day (QID) | ORAL | Status: DC | PRN

## 2018-11-22 MED ORDER — FUROSEMIDE 10 MG/ML IJ SOLN
80.0000 mg | Freq: Two times a day (BID) | INTRAMUSCULAR | Status: DC
  Administered 2018-11-22: 80 mg via INTRAVENOUS
  Filled 2018-11-22: qty 8

## 2018-11-22 MED ORDER — ALBUTEROL SULFATE (2.5 MG/3ML) 0.083% IN NEBU: 2.5000 mg | INHALATION_SOLUTION | RESPIRATORY_TRACT | Status: DC | PRN

## 2018-11-22 MED ORDER — FUROSEMIDE 10 MG/ML IJ SOLN
120.0000 mg | Freq: Four times a day (QID) | INTRAVENOUS | Status: DC
  Administered 2018-11-22 – 2018-11-23 (×5): 120 mg via INTRAVENOUS
  Filled 2018-11-22: qty 10
  Filled 2018-11-22: qty 12
  Filled 2018-11-22: qty 10
  Filled 2018-11-22: qty 12
  Filled 2018-11-22 (×2): qty 10
  Filled 2018-11-22: qty 12

## 2018-11-22 MED ORDER — RIVAROXABAN 20 MG PO TABS
20.0000 mg | ORAL_TABLET | Freq: Every day | ORAL | Status: DC
  Administered 2018-11-22 – 2018-11-25 (×4): 20 mg via ORAL
  Filled 2018-11-22 (×4): qty 1

## 2018-11-22 MED ORDER — SODIUM CHLORIDE 0.9 % IV SOLN: 250.0000 mL | INTRAVENOUS | Status: DC | PRN

## 2018-11-22 MED ORDER — IPRATROPIUM-ALBUTEROL 0.5-2.5 (3) MG/3ML IN SOLN: 3.0000 mL | Freq: Two times a day (BID) | RESPIRATORY_TRACT | Status: DC

## 2018-11-22 MED ORDER — ROSUVASTATIN CALCIUM 20 MG PO TABS
20.0000 mg | ORAL_TABLET | Freq: Every day | ORAL | Status: DC
  Filled 2018-11-22: qty 1

## 2018-11-22 MED ORDER — SODIUM CHLORIDE 0.9% FLUSH: 3.0000 mL | INTRAVENOUS | Status: DC | PRN

## 2018-11-22 MED ORDER — IPRATROPIUM BROMIDE 0.02 % IN SOLN
0.5000 mg | Freq: Four times a day (QID) | RESPIRATORY_TRACT | Status: DC
  Administered 2018-11-22: 0.5 mg via RESPIRATORY_TRACT
  Filled 2018-11-22: qty 2.5

## 2018-11-22 MED ORDER — LISINOPRIL 20 MG PO TABS: 20.0000 mg | ORAL_TABLET | Freq: Every day | ORAL | Status: DC

## 2018-11-22 MED ORDER — METHYLPREDNISOLONE SODIUM SUCC 125 MG IJ SOLR
80.0000 mg | Freq: Two times a day (BID) | INTRAMUSCULAR | Status: DC
  Administered 2018-11-22 – 2018-11-23 (×4): 80 mg via INTRAVENOUS
  Filled 2018-11-22 (×4): qty 2

## 2018-11-22 NOTE — Progress Notes (Signed)
Pt order is PRN Pt not in need of BIPAP at this time. RT will continue to monitor.

## 2018-11-22 NOTE — H&P (Signed)
History and Physical    Nicholas Caldwell WUJ:[id number] DOB: 01-13-1946 DOA: 11/21/2018  PCP: [facility]  Patient coming from: Home  Chief Complaint: Shortness of breath  HPI: Nicholas Caldwell is a 73 y.o. male with medical history significant of medical noncompliance, a flutter, coronary artery disease, chronic systolic congestive heart failure, COPD, chronic respiratory failure on 3 L at home was just discharged this past week for the same issue comes in with shortness of breath.  Patient has a longstanding history of noncompliance with his Lasix because it makes him urinate too much.  His wife reports that his swelling in his legs has gotten markedly worse since he was discharged 3 to 4 days ago.  He says he is taking his medications.  He is still smoking.  He denies any fevers.  Denies any cough.  Has had significant swelling in his legs.  Patient comes in short of breath found to be hypoxic on his 3 L at 89% placed on BiPAP in the ED and is feeling better.  Patient being referred for admission for acute respiratory failure secondary to COPD and CHF.  On arrival he was wheezing quite a bit.  Review of Systems: As per HPI otherwise 10 point review of systems negative.   Past Medical History:  Diagnosis Date  . Atrial flutter ([place])    a. recurrent AFlutter with RVR;  b. Amiodarone Rx started 4/16  . CAD (coronary artery disease)    a. LHC 1/16:  mLAD diffuse disease, pLCx mild disease, dLCx with disease but too small for PCI, RCA ok, EF 25-30%  . Chronic pain   . Chronic systolic CHF (congestive heart failure) ([place])   . COPD (chronic obstructive pulmonary disease) ([place])   . Diabetes mellitus without complication ([place])   . Hypercholesteremia   . Hypertension   . NICM (nonischemic cardiomyopathy) ([place])    a.dx 2016. b. 2D echo 06/2016 - Last echo 07/01/16: mod dilated LV, mod LVH, EF 25-30%, mild-mod MR, sev LAE, mild-mod reduced RV systolic function, mild-mod TR, PASP 34mHG.  .Marland KitchenPAF  (paroxysmal atrial fibrillation) (HCC)    On amio - ot a candidate for flecainide due to cardiomyopathy, not a candidate for Tikosyn due to prolonged QT, and felt to be a poor candidate for ablation given left atrial size.  . Pulmonary hypertension ([place]   . Tobacco abuse     Past Surgical History:  Procedure Laterality Date  . CARDIOVERSION N/A 07/30/2018   Procedure: CARDIOVERSION;  Surgeon: MLarey Dresser MD;  [facility];  Service: Cardiovascular;  Laterality: N/A;  . LEFT AND RIGHT HEART CATHETERIZATION WITH CORONARY ANGIOGRAM N/A 11/06/2014   Procedure: LEFT AND RIGHT HEART CATHETERIZATION WITH CORONARY ANGIOGRAM;  Surgeon: JJettie Booze MD;  [facility];  Service: Cardiovascular;  Laterality: N/A;  . SPLENECTOMY       reports that he has been smoking cigarettes. He has a 34.00 pack-year smoking history. He has never used smokeless tobacco. He reports that he does not drink alcohol or use drugs.  No Known Allergies  Family History  Problem Relation Age of Onset  . Heart disease Mother   . Hypertension Mother   . Heart failure Mother   . Heart disease Father   . Hypertension Sister   . Heart attack Neg Hx   . Stroke Neg Hx     Prior to Admission medications   Medication Sig Start Date End Date Taking? Authorizing Provider  budesonide-formoterol (SYMBICORT) 160-4.5 MCG/ACT  inhaler Inhale 2 puffs into the lungs 2 (two) times daily.   Yes [provider]  carvedilol (COREG) 6.25 MG tablet Take 1 tablet (6.25 mg total) by mouth 2 (two) times daily with a meal. 08/02/18  Yes Regalado, Belkys A, MD  diazepam (VALIUM) 10 MG tablet Take 0.5 tablets (5 mg total) by mouth daily as needed for anxiety or sleep. Patient taking differently: Take 10 mg by mouth daily as needed for anxiety or sleep.  12/19/16  Yes Theodis Blaze, MD  ferrous sulfate 325 (65 FE) MG tablet Take 325 mg by mouth 3 (three) times daily as needed (for supplementation).   Yes  [provider]  fluticasone (FLONASE) 50 MCG/ACT nasal spray Place 2 sprays into both nostrils daily as needed for allergies.    Yes [provider]  folic acid (FOLVITE) 1 MG tablet Take 1 mg by mouth daily.   Yes [provider]  gabapentin (NEURONTIN) 600 MG tablet Take 1 tablet (600 mg total) by mouth 2 (two) times daily. 12/19/16  Yes Theodis Blaze, MD  glucose 4 GM chewable tablet Chew 4 tablets by mouth See admin instructions. Chew 4 tablets by mouth as needed for low blood sugar - repeat every 15 minutes if blood sugar less than 70   Yes [provider]  guaifenesin (HUMIBID E) 400 MG TABS tablet Take 1 tablet (400 mg total) by mouth every 6 (six) hours as needed. Patient taking differently: Take 400 mg by mouth every 6 (six) hours as needed (cough).  08/02/18  Yes Regalado, Belkys A, MD  hydrocortisone (ANUSOL-HC) 2.5 % rectal cream Place 1 application rectally 2 (two) times daily as needed for hemorrhoids or itching.    Yes [provider]  hydrocortisone 1 % lotion Apply 1 application topically See admin instructions. Apply small amount to back twice daily for itchy rash   Yes [provider]  insulin aspart protamine - aspart (NOVOLOG MIX 70/30 FLEXPEN) (70-30) 100 UNIT/ML FlexPen Inject 0.05 mLs (5 Units total) into the skin 2 (two) times daily. Patient taking differently: Inject 30 Units into the skin 2 (two) times daily.  08/02/18  Yes Regalado, Belkys A, MD  ipratropium-albuterol (DUONEB) 0.5-2.5 (3) MG/3ML SOLN Take 3 mLs by nebulization 2 (two) times daily.   Yes [provider]  latanoprost (XALATAN) 0.005 % ophthalmic solution Place 1 drop into both eyes at bedtime.   Yes [provider]  lisinopril (PRINIVIL,ZESTRIL) 20 MG tablet Take 1 tablet (20 mg total) by mouth daily. 05/27/18  Yes Georgiana Shore, NP  Magnesium Oxide 420 (252 Mg) MG TABS Take 420 mg by mouth 2 (two) times daily.   Yes [provider]  metFORMIN (GLUCOPHAGE-XR) 500 MG 24 hr tablet Take 1,000 mg by mouth 2 (two) times daily with a meal.   Yes [provider]  oxyCODONE-acetaminophen (PERCOCET) 10-325 MG tablet Take 1 tablet by mouth every 6 (six) hours as needed for pain.   Yes [provider]  OXYGEN Inhale 3 L into the lungs continuous.    Yes [provider]  potassium chloride SA (K-DUR,KLOR-CON) 20 MEQ tablet Take 2 tablets (40 mEq total) by mouth daily. 11/07/14  Yes Buriev, Arie Sabina, MD  rivaroxaban (XARELTO) 20 MG TABS tablet Take 1 tablet (20 mg total) by mouth daily with supper. Patient taking differently: Take 20 mg by mouth daily at 6 PM.  01/20/17  Yes Jettie Booze, MD  rosuvastatin (CRESTOR) 20 MG  tablet Take 1 tablet (20 mg total) by mouth daily. 05/07/18  Yes Matilde Haymaker, MD  spironolactone (ALDACTONE) 25 MG tablet Take 1 tablet (25 mg total) by mouth at bedtime. 02/04/18 02/04/19 Yes Tillery, Satira Mccallum, PA-C  torsemide (DEMADEX) 20 MG tablet Take 3 tablets (60 mg total) by mouth 2 (two) times daily. 08/02/18  Yes Regalado, Belkys A, MD  albuterol (PROVENTIL HFA;VENTOLIN HFA) 108 (90 BASE) MCG/ACT inhaler Inhale 2 puffs into the lungs every 6 (six) hours as needed for wheezing or shortness of breath. Patient not taking: Reported on 11/14/2018 11/07/14   Kinnie Feil, MD    Physical Exam: Vitals:   11/22/18 0015 11/22/18 0045 11/22/18 0100 11/22/18 0115  BP: (!) 114/57 125/73 109/60 121/67  Pulse: (!) 53 69 75 (!) 59  Resp: (!) 0 15 (!) 22 16  Temp:      TempSrc:      SpO2: 97% 97% 98% 95%      Constitutional: NAD, calm, comfortable Vitals:   11/22/18 0015 11/22/18 0045 11/22/18 0100 11/22/18 0115  BP: (!) 114/57 125/73 109/60 121/67  Pulse: (!) 53 69 75 (!) 59  Resp: (!) 0 15 (!) 22 16  Temp:      TempSrc:      SpO2: 97% 97% 98% 95%   Eyes: PERRL, lids and conjunctivae normal ENMT: Mucous membranes are moist. Posterior pharynx clear of any exudate or  lesions.Normal dentition.  Neck: normal, supple, no masses, no thyromegaly Respiratory: Diminished to auscultation bilaterally, bilateral wheezing, no crackles. Normal respiratory effort. No accessory muscle use.  Cardiovascular: Regular rate and rhythm, no murmurs / rubs / gallops.  2-3+ extremity edema. 2+ pedal pulses. No carotid bruits.  Abdomen: no tenderness, no masses palpated. No hepatosplenomegaly. Bowel sounds positive.  Musculoskeletal: no clubbing / cyanosis. No joint deformity upper and lower extremities. Good ROM, no contractures. Normal muscle tone.  Skin: no rashes, lesions, ulcers. No induration Neurologic: CN 2-12 grossly intact. Sensation intact, DTR normal. Strength 5/5 in all 4.  Psychiatric: Normal judgment and insight. Alert and oriented x 3. Normal mood.    Labs on Admission: I have personally reviewed following labs and imaging studies  CBC: Recent Labs  Lab 11/16/18 0237 11/21/18 2238  WBC 7.3 6.9  NEUTROABS  --  5.0  HGB 8.1* 8.6*  HCT 28.3* 31.7*  MCV 90.4 93.8  PLT 338 096   Basic Metabolic Panel: Recent Labs  Lab 11/16/18 0237 11/21/18 2238  NA 140 138  K 3.2* 4.1  CL 92* 88*  CO2 41* 41*  GLUCOSE 112* 131*  BUN 29* 14  CREATININE 1.13 0.91  CALCIUM 8.1* 8.0*  MG 2.1  --    GFR: Estimated Creatinine Clearance: 104.9 mL/min (by C-G formula based on SCr of 0.91 mg/dL). Liver Function Tests: No results for input(s): AST, ALT, ALKPHOS, BILITOT, PROT, ALBUMIN in the last 168 hours. No results for input(s): LIPASE, AMYLASE in the last 168 hours. No results for input(s): AMMONIA in the last 168 hours. Coagulation Profile: No results for input(s): INR, PROTIME in the last 168 hours. Cardiac Enzymes: No results for input(s): CKTOTAL, CKMB, CKMBINDEX, TROPONINI in the last 168 hours. BNP (last 3 results) No results for input(s): PROBNP in the last 8760 hours. HbA1C: No results for input(s): HGBA1C in the last 72 hours. CBG: Recent Labs  Lab  11/15/18 0824 11/15/18 1159 11/15/18 1634 11/15/18 2141 11/16/18 0725  GLUCAP 165* 133* 113* 176* 186*   Lipid Profile: No results for  input(s): CHOL, HDL, LDLCALC, TRIG, CHOLHDL, LDLDIRECT in the last 72 hours. Thyroid Function Tests: No results for input(s): TSH, T4TOTAL, FREET4, T3FREE, THYROIDAB in the last 72 hours. Anemia Panel: No results for input(s): VITAMINB12, FOLATE, FERRITIN, TIBC, IRON, RETICCTPCT in the last 72 hours. Urine analysis:    Component Value Date/Time   COLORURINE YELLOW 12/13/2016 [place] 12/13/2016 0420   LABSPEC 1.010 12/13/2016 0420   PHURINE 5.0 12/13/2016 0420   GLUCOSEU >=500 (A) 12/13/2016 0420   HGBUR NEGATIVE 12/13/2016 0420   BILIRUBINUR NEGATIVE 12/13/2016 0420   KETONESUR NEGATIVE 12/13/2016 0420   PROTEINUR 100 (A) 12/13/2016 0420   UROBILINOGEN 0.2 10/26/2014 2206   NITRITE NEGATIVE 12/13/2016 0420   LEUKOCYTESUR NEGATIVE 12/13/2016 0420   Sepsis Labs: !!!!!!!!!!!!!!!!!!!!!!!!!!!!!!!!!!!!!!!!!!!! _0 (procalcitonin:4,lacticidven:4) ) Recent Results (from the past 240 hour(s))  Respiratory Panel by PCR     Status: None   Collection Time: 11/13/18 12:50 AM  Result Value Ref Range Status   Adenovirus NOT DETECTED NOT DETECTED Final   Coronavirus 229E NOT DETECTED NOT DETECTED Final   Coronavirus HKU1 NOT DETECTED NOT DETECTED Final   Coronavirus NL63 NOT DETECTED NOT DETECTED Final   Coronavirus OC43 NOT DETECTED NOT DETECTED Final   Metapneumovirus NOT DETECTED NOT DETECTED Final   Rhinovirus / Enterovirus NOT DETECTED NOT DETECTED Final   Influenza A NOT DETECTED NOT DETECTED Final   Influenza B NOT DETECTED NOT DETECTED Final   Parainfluenza Virus 1 NOT DETECTED NOT DETECTED Final   Parainfluenza Virus 2 NOT DETECTED NOT DETECTED Final   Parainfluenza Virus 3 NOT DETECTED NOT DETECTED Final   Parainfluenza Virus 4 NOT DETECTED NOT DETECTED Final   Respiratory Syncytial Virus NOT DETECTED NOT DETECTED  Final   Bordetella pertussis NOT DETECTED NOT DETECTED Final   Chlamydophila pneumoniae NOT DETECTED NOT DETECTED Final   Mycoplasma pneumoniae NOT DETECTED NOT DETECTED Final  MRSA PCR Screening     Status: None   Collection Time: 11/13/18  2:55 AM  Result Value Ref Range Status   MRSA by PCR NEGATIVE NEGATIVE Final    Comment:        The GeneXpert MRSA Assay (FDA approved for NASAL specimens only), is one component of a comprehensive MRSA colonization surveillance program. It is not intended to diagnose MRSA infection nor to guide or monitor treatment for MRSA infections. Performed at [facility], [place] [address], [place], [place] [address]      Radiological Exams on Admission: Dg Chest Portable 1 View  Result Date: 11/21/2018 CLINICAL DATA:  sob, 89% on home 3L O2. Given 10 albuterol, 1 Atrovent and 125 solumedrol. Now at 100% on neb. Wheezing and rhonci in all lobes. sob EXAM: PORTABLE CHEST 1 VIEW COMPARISON:  11/15/2010 FINDINGS: Enlarged cardiac silhouette. LEFT basilar atelectasis and effusion. Fine airspace disease. No interval change. IMPRESSION: No interval change in congestive heart failure pattern. Electronically Signed   By: Suzy Bouchard M.D.   On: 11/21/2018 23:16    EKG: Independently reviewed.  Normal sinus rhythm no acute changes Old chart reviewed Chest x-ray reviewed pulmonary edema  Assessment/Plan 73 year old male with acute on chronic respiratory failure secondary to COPD exacerbation and congestive heart failure exacerbation Principal Problem:   Acute on chronic respiratory failure with hypoxia and hypercapnia (HCC)-multifactorial.  Continue BiPAP and wean as tolerates.  Chronically on 3 L of oxygen at home.  Treat COPD and CHF as below.  Suspect a component of noncompliance.  Active Problems:   COPD exacerbation (  HCC)-frequent bronchodilators and placed on IV Solu-Medrol 80 mg IV every 12 hours.  Continue BiPAP.    Acute exacerbation of CHF  (congestive heart failure) (HCC)-placed on Lasix 80 mg IV every 12 hours.  Will not repeat any echo as this is recently been done.  Again suspects noncompliance with his Lasix at home.    Medically noncompliant-noted    Essential hypertension-clarify resume home meds    Chronic pain-hold pain medicines and sedatives at this time secondary to tenuous respiratory status as above    PAF (paroxysmal atrial fibrillation) (HCC)-currently rate controlled in normal sinus rhythm    CAD - Non-obstructive by LHC1/16-noted stable    OSA (obstructive sleep apnea)-BiPAP tonight    Chronic respiratory failure with hypoxia (HCC)-chronically on 3 L at home.     DVT prophylaxis: SCDs Code Status: Full Family Communication: Wife Disposition Plan: Days Consults called: None Admission status: Admission   DAVID,RACHAL A MD [facility]  If 7PM-7AM, please contact night-coverage [email or web address] Password [email or web address]  11/22/2018, 1:40 AM
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
## 2018-11-22 NOTE — Progress Notes (Signed)
[facility] TEAM 1 - Stepdown/ICU TEAM  Nicholas Caldwell  GEX:[id number] DOB: 06-Aug-1946 DOA: 11/21/2018 [facility]: [facility]    Brief Narrative:  73 y.o. male w/ a hx of medical noncompliance, A flutter, CAD, chronic systolic CHF, and COPD on 3 L O2 at home who was discharged the week prior for the same issue and returned to the ED w/ shortness of breath. His wife reported the swelling in his legs had gotten markedly worse since he was discharged. He is still smoking.    Significant Events: 1/20 admit   Subjective: Pt is seen for a f/u visit.    Assessment & Plan:  Acute on chronic hypoxic and hypercapnic respiratory failure  Acute exacerbation of systolic CHF  Acute COPD exacerbation -chronic O2 dependence  Noncompliance  HTN  Paroxysmal atrial flutter   CAD  OSA  DVT prophylaxis: SCDs Code Status: FULL CODE Family Communication:  Disposition Plan:   Consultants:  None  Antimicrobials:  None  Objective: Blood pressure (!) 121/46, pulse 66, temperature 98.9 F (37.2 C), temperature source Oral, resp. rate (!) 23, height 6' (1.829 m), weight 129.2 kg, SpO2 94 %.  Intake/Output Summary (Last 24 hours) at 11/22/2018 1422 Last data filed at 11/22/2018 1243 Gross per 24 hour  Intake 9 ml  Output 3150 ml  Net -3141 ml   Filed Weights   11/22/18 0257  Weight: 129.2 kg    Examination: Pt was seen for a f/u visit.    CBC: Recent Labs  Lab 11/16/18 0237 11/21/18 2238 11/22/18 0448  WBC 7.3 6.9 5.8  NEUTROABS  --  5.0  --   HGB 8.1* 8.6* 8.8*  HCT 28.3* 31.7* 30.8*  MCV 90.4 93.8 92.8  PLT 338 376 010   Basic Metabolic Panel: Recent Labs  Lab 11/16/18 0237 11/21/18 2238 11/22/18 0448  NA 140 138 143  K 3.2* 4.1 4.1  CL 92* 88* 90*  CO2 41* 41* 42*  GLUCOSE 112* 131* 130*  BUN 29* 14 14  CREATININE 1.13 0.91 0.89  CALCIUM 8.1* 8.0* 8.3*  MG 2.1  --   --    GFR: Estimated Creatinine Clearance: 107.3 mL/min (by C-G formula based on SCr of  0.89 mg/dL).  Liver Function Tests: No results for input(s): AST, ALT, ALKPHOS, BILITOT, PROT, ALBUMIN in the last 168 hours. No results for input(s): LIPASE, AMYLASE in the last 168 hours. No results for input(s): AMMONIA in the last 168 hours.  Coagulation Profile: No results for input(s): INR, PROTIME in the last 168 hours.  Cardiac Enzymes: No results for input(s): CKTOTAL, CKMB, CKMBINDEX, TROPONINI in the last 168 hours.  HbA1C: Hgb A1c MFr Bld  Date/Time Value Ref Range Status  11/13/2018 06:02 AM 6.5 (H) 4.8 - 5.6 % Final    Comment:    (NOTE) Pre diabetes:          5.7%-6.4% Diabetes:              >6.4% Glycemic control for   <7.0% adults with diabetes   05/01/2018 05:10 AM 8.1 (H) 4.8 - 5.6 % Final    Comment:    (NOTE) Pre diabetes:          5.7%-6.4% Diabetes:              >6.4% Glycemic control for   <7.0% adults with diabetes     CBG: Recent Labs  Lab 11/15/18 1634 11/15/18 2141 11/16/18 0725  GLUCAP 113* 176* 186*    Recent Results (  from the past 240 hour(s))  Respiratory Panel by PCR     Status: None   Collection Time: 11/13/18 12:50 AM  Result Value Ref Range Status   Adenovirus NOT DETECTED NOT DETECTED Final   Coronavirus 229E NOT DETECTED NOT DETECTED Final   Coronavirus HKU1 NOT DETECTED NOT DETECTED Final   Coronavirus NL63 NOT DETECTED NOT DETECTED Final   Coronavirus OC43 NOT DETECTED NOT DETECTED Final   Metapneumovirus NOT DETECTED NOT DETECTED Final   Rhinovirus / Enterovirus NOT DETECTED NOT DETECTED Final   Influenza A NOT DETECTED NOT DETECTED Final   Influenza B NOT DETECTED NOT DETECTED Final   Parainfluenza Virus 1 NOT DETECTED NOT DETECTED Final   Parainfluenza Virus 2 NOT DETECTED NOT DETECTED Final   Parainfluenza Virus 3 NOT DETECTED NOT DETECTED Final   Parainfluenza Virus 4 NOT DETECTED NOT DETECTED Final   Respiratory Syncytial Virus NOT DETECTED NOT DETECTED Final   Bordetella pertussis NOT DETECTED NOT DETECTED  Final   Chlamydophila pneumoniae NOT DETECTED NOT DETECTED Final   Mycoplasma pneumoniae NOT DETECTED NOT DETECTED Final  MRSA PCR Screening     Status: None   Collection Time: 11/13/18  2:55 AM  Result Value Ref Range Status   MRSA by PCR NEGATIVE NEGATIVE Final    Comment:        The GeneXpert MRSA Assay (FDA approved for NASAL specimens only), is one component of a comprehensive MRSA colonization surveillance program. It is not intended to diagnose MRSA infection nor to guide or monitor treatment for MRSA infections. Performed at [facility], [place] [address], [place], [place] [address]      Scheduled Meds: . furosemide  80 mg Intravenous BID  . ipratropium-albuterol  3 mL Nebulization Q6H  . latanoprost  1 drop Both Eyes QHS  . methylPREDNISolone (SOLU-MEDROL) injection  80 mg Intravenous Q12H  . metoprolol tartrate  5 mg Intravenous Q6H  . rivaroxaban  20 mg Oral q1800  . sodium chloride flush  3 mL Intravenous Q12H   Continuous Infusions: . sodium chloride       LOS: 0 days   Time spent: No Charge  Cherene Altes, MD [facility] Office  [phone] Pager - Text Page per Amion as per below:  On-Call/Text Page:      [email or web address]  If 7PM-7AM, please contact night-coverage [email or web address] 11/22/2018, 2:22 PM

## 2018-11-22 NOTE — ED Notes (Signed)
Admitting provider bedside

## 2018-11-22 NOTE — Discharge Instructions (Signed)
Information on my medicine - XARELTO (Rivaroxaban)  Why was Xarelto prescribed for you? Xarelto was prescribed for you to reduce the risk of a blood clot forming that can cause a stroke if you have a medical condition called atrial fibrillation (a type of irregular heartbeat).  What do you need to know about xarelto ? Take your Xarelto ONCE DAILY at the same time every day with your evening meal. If you have difficulty swallowing the tablet whole, you may crush it and mix in applesauce just prior to taking your dose.  Take Xarelto exactly as prescribed by your doctor and DO NOT stop taking Xarelto without talking to the doctor who prescribed the medication.  Stopping without other stroke prevention medication to take the place of Xarelto may increase your risk of developing a clot that causes a stroke.  Refill your prescription before you run out.  After discharge, you should have regular check-up appointments with your healthcare provider that is prescribing your Xarelto.  In the future your dose may need to be changed if your kidney function or weight changes by a significant amount.  What do you do if you miss a dose? If you are taking Xarelto ONCE DAILY and you miss a dose, take it as soon as you remember on the same day then continue your regularly scheduled once daily regimen the next day. Do not take two doses of Xarelto at the same time or on the same day.   Important Safety Information A possible side effect of Xarelto is bleeding. You should call your healthcare provider right away if you experience any of the following: ? Bleeding from an injury or your nose that does not stop. ? Unusual colored urine (red or dark brown) or unusual colored stools (red or black). ? Unusual bruising for unknown reasons. ? A serious fall or if you hit your head (even if there is no bleeding).  Some medicines may interact with Xarelto and might increase your risk of bleeding while on  Xarelto. To help avoid this, consult your healthcare provider or pharmacist prior to using any new prescription or non-prescription medications, including herbals, vitamins, non-steroidal anti-inflammatory drugs (NSAIDs) and supplements.  This website has more information on Xarelto: [email or web address]

## 2018-11-22 NOTE — Plan of Care (Signed)
  Problem: Education: Goal: Knowledge of General Education information will improve Description Including pain rating scale, medication(s)/side effects and non-pharmacologic comfort measures Outcome: Progressing   Problem: Clinical Measurements: Goal: Ability to maintain clinical measurements within normal limits will improve Outcome: Progressing   Problem: Clinical Measurements: Goal: Respiratory complications will improve Outcome: Progressing   Problem: Clinical Measurements: Goal: Ability to maintain clinical measurements within normal limits will improve Outcome: Progressing   Problem: Clinical Measurements: Goal: Cardiovascular complication will be avoided Outcome: Progressing

## 2018-11-23 LAB — GLUCOSE, CAPILLARY
Glucose-Capillary: 209 mg/dL — ABNORMAL HIGH (ref 70–99)
Glucose-Capillary: 253 mg/dL — ABNORMAL HIGH (ref 70–99)
Glucose-Capillary: 195 mg/dL — ABNORMAL HIGH (ref 70–99)
Glucose-Capillary: 268 mg/dL — ABNORMAL HIGH (ref 70–99)

## 2018-11-23 LAB — COMPREHENSIVE METABOLIC PANEL
ALT: 19 U/L (ref 0–44)
AST: 31 U/L (ref 15–41)
Albumin: 3 g/dL — ABNORMAL LOW (ref 3.5–5.0)
Alkaline Phosphatase: 89 U/L (ref 38–126)
Anion gap: 13 (ref 5–15)
BUN: 22 mg/dL (ref 8–23)
CHLORIDE: 87 mmol/L — AB (ref 98–111)
CO2: 40 mmol/L — AB (ref 22–32)
Calcium: 8.3 mg/dL — ABNORMAL LOW (ref 8.9–10.3)
Creatinine, Ser: 1.08 mg/dL (ref 0.61–1.24)
GFR calc Af Amer: >60 mL/min (ref >60)
GFR calc non Af Amer: >60 mL/min (ref >60)
Glucose, Bld: 285 mg/dL — ABNORMAL HIGH (ref 70–99)
Potassium: 3.1 mmol/L — ABNORMAL LOW (ref 3.5–5.1)
Sodium: 140 mmol/L (ref 135–145)
Total Bilirubin: 0.8 mg/dL (ref 0.3–1.2)
Total Protein: 6.6 g/dL (ref 6.5–8.1)

## 2018-11-23 LAB — CBC
HCT: 30.4 % — ABNORMAL LOW (ref 39.0–52.0)
Hemoglobin: 8.8 g/dL — ABNORMAL LOW (ref 13.0–17.0)
MCH: 25.1 pg — AB (ref 26.0–34.0)
MCHC: 28.9 g/dL — ABNORMAL LOW (ref 30.0–36.0)
MCV: 86.6 fL (ref 80.0–100.0)
Platelets: 400 10*3/uL (ref 150–400)
RBC: 3.51 MIL/uL — ABNORMAL LOW (ref 4.22–5.81)
RDW: 17.1 % — ABNORMAL HIGH (ref 11.5–15.5)
WBC: 5.9 10*3/uL (ref 4.0–10.5)
nRBC: 0.5 % — ABNORMAL HIGH (ref 0.0–0.2)

## 2018-11-23 LAB — MAGNESIUM: Magnesium: 1.8 mg/dL (ref 1.7–2.4)

## 2018-11-23 LAB — PHOSPHORUS: Phosphorus: 3 mg/dL (ref 2.5–4.6)

## 2018-11-23 MED ORDER — IPRATROPIUM-ALBUTEROL 0.5-2.5 (3) MG/3ML IN SOLN
3.0000 mL | Freq: Three times a day (TID) | RESPIRATORY_TRACT | Status: DC
  Administered 2018-11-23: 3 mL via RESPIRATORY_TRACT
  Filled 2018-11-23: qty 3

## 2018-11-23 MED ORDER — MOMETASONE FURO-FORMOTEROL FUM 200-5 MCG/ACT IN AERO
2.0000 | INHALATION_SPRAY | Freq: Two times a day (BID) | RESPIRATORY_TRACT | Status: DC
  Administered 2018-11-23 – 2018-11-24 (×3): 2 via RESPIRATORY_TRACT
  Filled 2018-11-23: qty 8.8

## 2018-11-23 MED ORDER — POTASSIUM CHLORIDE CRYS ER 20 MEQ PO TBCR
40.0000 meq | EXTENDED_RELEASE_TABLET | Freq: Three times a day (TID) | ORAL | Status: DC
  Administered 2018-11-23 – 2018-11-26 (×10): 40 meq via ORAL
  Filled 2018-11-23 (×10): qty 2

## 2018-11-23 MED ORDER — ALBUTEROL SULFATE (2.5 MG/3ML) 0.083% IN NEBU: 2.5000 mg | INHALATION_SOLUTION | RESPIRATORY_TRACT | Status: DC | PRN

## 2018-11-23 MED ORDER — FUROSEMIDE 10 MG/ML IJ SOLN
120.0000 mg | Freq: Three times a day (TID) | INTRAVENOUS | Status: AC
  Administered 2018-11-23 – 2018-11-24 (×3): 120 mg via INTRAVENOUS
  Filled 2018-11-23: qty 10
  Filled 2018-11-23 (×2): qty 12

## 2018-11-23 MED ORDER — IPRATROPIUM-ALBUTEROL 0.5-2.5 (3) MG/3ML IN SOLN
3.0000 mL | Freq: Two times a day (BID) | RESPIRATORY_TRACT | Status: DC
  Administered 2018-11-23: 3 mL via RESPIRATORY_TRACT
  Filled 2018-11-23: qty 3

## 2018-11-23 MED ORDER — OXYCODONE HCL 5 MG PO TABS
5.0000 mg | ORAL_TABLET | Freq: Four times a day (QID) | ORAL | Status: DC | PRN
  Administered 2018-11-23 – 2018-11-26 (×12): 5 mg via ORAL
  Filled 2018-11-23 (×12): qty 1

## 2018-11-23 MED ORDER — METHYLPREDNISOLONE SODIUM SUCC 125 MG IJ SOLR
60.0000 mg | Freq: Two times a day (BID) | INTRAMUSCULAR | Status: DC
  Administered 2018-11-24: 60 mg via INTRAVENOUS
  Filled 2018-11-23: qty 2

## 2018-11-23 MED ORDER — DIAZEPAM 5 MG PO TABS
5.0000 mg | ORAL_TABLET | Freq: Two times a day (BID) | ORAL | Status: DC | PRN
  Administered 2018-11-23 – 2018-11-26 (×4): 5 mg via ORAL
  Filled 2018-11-23 (×4): qty 1

## 2018-11-23 MED ORDER — OXYCODONE-ACETAMINOPHEN 10-325 MG PO TABS: 1.0000 | ORAL_TABLET | Freq: Four times a day (QID) | ORAL | Status: DC | PRN

## 2018-11-23 MED ORDER — OXYCODONE-ACETAMINOPHEN 5-325 MG PO TABS
1.0000 | ORAL_TABLET | Freq: Four times a day (QID) | ORAL | Status: DC | PRN
  Administered 2018-11-23 – 2018-11-26 (×12): 1 via ORAL
  Filled 2018-11-23 (×12): qty 1

## 2018-11-23 MED ORDER — SPIRONOLACTONE 25 MG PO TABS
25.0000 mg | ORAL_TABLET | Freq: Every day | ORAL | Status: DC
  Administered 2018-11-24 – 2018-11-25 (×2): 25 mg via ORAL
  Filled 2018-11-23 (×2): qty 1

## 2018-11-23 NOTE — Care Management Note (Addendum)
Case Management Note  Patient Details  Name: Nicholas Caldwell MRN: [id number] Date of Birth: 01-20-1946  Subjective/Objective:    Admitted with acute on chronic respiratory distress.Hx of  a flutter, CAD, CHF, COPD, DM, HTN, HDL, and pulmonary hypertension. Recent admit 1/10-1/14/20120, COPD exacerbation. Pt was set up with [facility] with last admission,however, case wasn't   open 2/2 pt's availability. DIY:MEBR oxygen provided per [facility].   Jeanette Moffatt (Spouse) Sima Matas (Daughter)     [phone]     PCP: [facility], NCM [facility]'s [facility].  Cardiologist ; Dr.McClean  Action/Plan:   Transition to home with home health services when medically ready.  - @ d/c pt will need portable oxygen tank for transportation to home.  Expected Discharge Date:  11/24/18               Expected Discharge Plan:  [place]  In-House Referral:     Discharge planning Services  CM Consult  Post Acute Care Choice:   Choice offered to:  Patient  DME Arranged:    DME Agency:     HH Arranged:  PT, RN  [place] Agency:  [facility], pending MD's orders  Status of Service:  In process, will continue to follow  If discussed at Long Length of Stay Meetings, dates discussed:    Additional Comments:  Sharin Mons, RN 11/23/2018, 2:22 PM

## 2018-11-23 NOTE — Progress Notes (Signed)
Inpatient Diabetes Program Recommendations  AACE/ADA: New Consensus Statement on Inpatient Glycemic Control  Target Ranges:  Prepandial:   less than 140 mg/dL      Peak postprandial:   less than 180 mg/dL (1-2 hours)      Critically ill patients:  140 - 180 mg/dL   Results for Nicholas Caldwell, Nicholas Caldwell (MRN [id number]) as of 11/23/2018 09:34  Ref. Range 11/22/2018 23:06 11/23/2018 08:29  Glucose-Capillary Latest Ref Range: 70 - 99 mg/dL 322 (H) 209 (H)  Results for Nicholas Caldwell, Nicholas Caldwell (MRN [id number]) as of 11/23/2018 09:34  Ref. Range 11/13/2018 06:02  Hemoglobin A1C Latest Ref Range: 4.8 - 5.6 % 6.5 (H)   Review of Glycemic Control  Diabetes history: DM2 Outpatient Diabetes medications: 70/30 30 units BID, Metformin XR 1000 mg BID Current orders for Inpatient glycemic control: Novolog 0-9 units TID with meals, Novolog 0-5 units QHS; Solumedrol 80 mg Q12H  Inpatient Diabetes Program Recommendations:  Insulin - Basal: If Solumedrol was continued, please consider ordering Lantus 10 units Q24H. Insulin - Meal Coverage: If Solumedrol is continued, please consider ordering Novolog 3 units TID with meals for meal coverage if patient eats at least 50% of meals.  Thanks, Barnie Alderman, RN, MSN, CDE Diabetes Coordinator Inpatient Diabetes Program [phone] (Team Pager from 8am to 5pm)

## 2018-11-23 NOTE — Progress Notes (Signed)
TEAM 1 - Stepdown/ICU TEAM  Lawrance Wiedemann  XBD:[id number] DOB: 1946/03/28 DOA: 11/21/2018 PCP: [facility], [facility]    Brief Narrative:  73 y.o. male w/ a hx of medical noncompliance, A flutter, CAD, chronic systolic CHF, and COPD on 3 L O2 at home who was discharged the week prior for the same issue and returned to the ED w/ shortness of breath. His wife reported the swelling in his legs had gotten markedly worse since he was discharged. He is still smoking.    Significant Events: 1/20 admit   Subjective: The patient has been liberated from BiPAP.  He is alert oriented and conversant on nasal cannula oxygen in no acute distress.  He states that his breathing is still not back to his baseline but has improved.  He denies chest pain nausea or vomiting.  We had an extended discussion during which I explained to him the great importance of strict compliance with medications, tobacco cessation, and the absolute need for strict dietary compliance.  Assessment & Plan:  Acute on chronic hypoxic and hypercapnic respiratory failure Has been successfully weaned off BIPAP - cont to wean O2 support as able - due to issues noted below   Acute exacerbation of systolic CHF Net negative ~9L since admit already -continue to diurese but decrease dose  Filed Weights   11/22/18 0257  Weight: 129.2 kg    Acute COPD exacerbation -chronic O2 dependence Some wheezing still persist today therefore continue with steroid for now  Noncompliance Counseled patient on absolute importance of strictly compliant with medication and diet regimens  HTN Blood pressure quite variable -with aggressive ongoing diuresis follow without other medical changes  Paroxysmal atrial flutter  Normal sinus rhythm at time of exam today -monitor on telemetry  CAD Asymptomatic presently  DVT prophylaxis: SCDs Code Status: FULL CODE Family Communication: No family present at time of exam Disposition Plan: Stable  for transfer to telemetry bed  Consultants:  None  Antimicrobials:  None  Objective: Blood pressure 121/61, pulse 68, temperature 98.3 F (36.8 C), temperature source Oral, resp. rate (!) 22, height 6' (1.829 m), weight 129.2 kg, SpO2 90 %.  Intake/Output Summary (Last 24 hours) at 11/23/2018 1157 Last data filed at 11/23/2018 0836 Gross per 24 hour  Intake 422 ml  Output 8100 ml  Net -7678 ml   Filed Weights   11/22/18 0257  Weight: 129.2 kg    Examination: General: No acute respiratory distress Lungs: Bibasilar crackles with diffuse mild expiratory wheezing but overall improved airflow throughout Cardiovascular: Regular rate and rhythm without murmur gallop or rub normal S1 and S2 Abdomen: Nontender, nondistended, soft, bowel sounds positive, no rebound, no ascites, no appreciable mass Extremities: 2+ bilateral lower extremity edema   CBC: Recent Labs  Lab 11/21/18 2238 11/22/18 0448 11/23/18 0506  WBC 6.9 5.8 5.9  NEUTROABS 5.0  --   --   HGB 8.6* 8.8* 8.8*  HCT 31.7* 30.8* 30.4*  MCV 93.8 92.8 86.6  PLT 376 399 834   Basic Metabolic Panel: Recent Labs  Lab 11/21/18 2238 11/22/18 0448 11/23/18 0506  NA 138 143 140  K 4.1 4.1 3.1*  CL 88* 90* 87*  CO2 41* 42* 40*  GLUCOSE 131* 130* 285*  BUN _0 CREATININE 0.91 0.89 1.08  CALCIUM 8.0* 8.3* 8.3*  MG  --   --  1.8  PHOS  --   --  3.0   GFR: Estimated Creatinine Clearance: 88.4 mL/min (by C-G formula  based on SCr of 1.08 mg/dL).  Liver Function Tests: Recent Labs  Lab 11/23/18 0506  AST 31  ALT 19  ALKPHOS 89  BILITOT 0.8  PROT 6.6  ALBUMIN 3.0*    HbA1C: Hgb A1c MFr Bld  Date/Time Value Ref Range Status  11/13/2018 06:02 AM 6.5 (H) 4.8 - 5.6 % Final    Comment:    (NOTE) Pre diabetes:          5.7%-6.4% Diabetes:              >6.4% Glycemic control for   <7.0% adults with diabetes   05/01/2018 05:10 AM 8.1 (H) 4.8 - 5.6 % Final    Comment:    (NOTE) Pre diabetes:           5.7%-6.4% Diabetes:              >6.4% Glycemic control for   <7.0% adults with diabetes     CBG: Recent Labs  Lab 11/22/18 2306 11/23/18 0829 11/23/18 1120  GLUCAP 322* 209* 253*     Scheduled Meds: . carvedilol  6.25 mg Oral BID WC  . insulin aspart  0-5 Units Subcutaneous QHS  . insulin aspart  0-9 Units Subcutaneous TID WC  . ipratropium-albuterol  3 mL Nebulization BID  . latanoprost  1 drop Both Eyes QHS  . methylPREDNISolone (SOLU-MEDROL) injection  80 mg Intravenous Q12H  . metoprolol tartrate  5 mg Intravenous Q6H  . potassium chloride  40 mEq Oral TID  . rivaroxaban  20 mg Oral q1800  . rosuvastatin  20 mg Oral Daily  . sodium chloride flush  3 mL Intravenous Q12H   Continuous Infusions: . sodium chloride    . furosemide 120 mg (11/23/18 0836)     LOS: 1 day    Cherene Altes, MD [facility] Office  [phone] Pager - Text Page per Amion as per below:  On-Call/Text Page:      [email or web address]  If 7PM-7AM, please contact night-coverage [email or web address] 11/23/2018, 11:57 AM

## 2018-11-23 NOTE — Progress Notes (Signed)
All night patient on the call bell for different things.  Being very needy.  Would keep asking for food and drink even with elevated blood sugar levels.  Pt was rude with staff on a few occassions.  While cleaning the patient the NT found a pill in the bed and gave it to me.  The pill was identified as Percocet 10/325.  Which we did not give.  I went in and spoke to him about taking meds that the caregivers and doctors do not know about.  He denied it and wife gave some funny body language during encounter.

## 2018-11-24 DIAGNOSIS — I5023 Acute on chronic systolic (congestive) heart failure: Secondary | ICD-10-CM

## 2018-11-24 DIAGNOSIS — Z9119 Patient's noncompliance with other medical treatment and regimen: Secondary | ICD-10-CM

## 2018-11-24 DIAGNOSIS — J9621 Acute and chronic respiratory failure with hypoxia: Secondary | ICD-10-CM

## 2018-11-24 DIAGNOSIS — J9611 Chronic respiratory failure with hypoxia: Secondary | ICD-10-CM

## 2018-11-24 LAB — BASIC METABOLIC PANEL
Anion gap: 13 (ref 5–15)
BUN: 25 mg/dL — ABNORMAL HIGH (ref 8–23)
CO2: 43 mmol/L — ABNORMAL HIGH (ref 22–32)
Calcium: 8.4 mg/dL — ABNORMAL LOW (ref 8.9–10.3)
Chloride: 85 mmol/L — ABNORMAL LOW (ref 98–111)
Creatinine, Ser: 1 mg/dL (ref 0.61–1.24)
GFR calc Af Amer: >60 mL/min (ref >60)
GFR calc non Af Amer: >60 mL/min (ref >60)
Glucose, Bld: 211 mg/dL — ABNORMAL HIGH (ref 70–99)
Potassium: 3.2 mmol/L — ABNORMAL LOW (ref 3.5–5.1)
SODIUM: 141 mmol/L (ref 135–145)

## 2018-11-24 LAB — CBC
HCT: 32.1 % — ABNORMAL LOW (ref 39.0–52.0)
Hemoglobin: 9.4 g/dL — ABNORMAL LOW (ref 13.0–17.0)
MCH: 25.1 pg — ABNORMAL LOW (ref 26.0–34.0)
MCHC: 29.3 g/dL — ABNORMAL LOW (ref 30.0–36.0)
MCV: 85.8 fL (ref 80.0–100.0)
Platelets: 428 10*3/uL — ABNORMAL HIGH (ref 150–400)
RBC: 3.74 MIL/uL — ABNORMAL LOW (ref 4.22–5.81)
RDW: 16.7 % — ABNORMAL HIGH (ref 11.5–15.5)
WBC: 9.8 10*3/uL (ref 4.0–10.5)
nRBC: 0.2 % (ref 0.0–0.2)

## 2018-11-24 LAB — GLUCOSE, CAPILLARY
Glucose-Capillary: 241 mg/dL — ABNORMAL HIGH (ref 70–99)
Glucose-Capillary: 251 mg/dL — ABNORMAL HIGH (ref 70–99)
Glucose-Capillary: 196 mg/dL — ABNORMAL HIGH (ref 70–99)
Glucose-Capillary: 230 mg/dL — ABNORMAL HIGH (ref 70–99)

## 2018-11-24 LAB — MAGNESIUM: Magnesium: 1.9 mg/dL (ref 1.7–2.4)

## 2018-11-24 MED ORDER — IPRATROPIUM-ALBUTEROL 0.5-2.5 (3) MG/3ML IN SOLN
3.0000 mL | Freq: Four times a day (QID) | RESPIRATORY_TRACT | Status: DC
  Administered 2018-11-24: 3 mL via RESPIRATORY_TRACT
  Filled 2018-11-24: qty 3

## 2018-11-24 MED ORDER — TORSEMIDE 20 MG PO TABS: 80.0000 mg | ORAL_TABLET | Freq: Two times a day (BID) | ORAL | Status: DC

## 2018-11-24 MED ORDER — MAGNESIUM SULFATE 2 GM/50ML IV SOLN
2.0000 g | Freq: Once | INTRAVENOUS | Status: AC
  Administered 2018-11-24: 2 g via INTRAVENOUS
  Filled 2018-11-24: qty 50

## 2018-11-24 MED ORDER — IPRATROPIUM-ALBUTEROL 0.5-2.5 (3) MG/3ML IN SOLN
3.0000 mL | Freq: Three times a day (TID) | RESPIRATORY_TRACT | Status: DC
  Administered 2018-11-24 (×2): 3 mL via RESPIRATORY_TRACT
  Filled 2018-11-24 (×2): qty 3

## 2018-11-24 MED ORDER — POTASSIUM CHLORIDE CRYS ER 20 MEQ PO TBCR
40.0000 meq | EXTENDED_RELEASE_TABLET | Freq: Once | ORAL | Status: AC
  Administered 2018-11-24: 40 meq via ORAL
  Filled 2018-11-24: qty 2

## 2018-11-24 MED ORDER — PREDNISONE 20 MG PO TABS: 40.0000 mg | ORAL_TABLET | Freq: Every day | ORAL | Status: DC

## 2018-11-24 MED ORDER — LORAZEPAM 2 MG/ML IJ SOLN: 1.0000 mg | Freq: Once | INTRAMUSCULAR | Status: DC

## 2018-11-24 MED ORDER — FUROSEMIDE 10 MG/ML IJ SOLN
120.0000 mg | Freq: Two times a day (BID) | INTRAVENOUS | Status: DC
  Filled 2018-11-24: qty 12

## 2018-11-24 MED ORDER — LISINOPRIL 10 MG PO TABS
10.0000 mg | ORAL_TABLET | Freq: Every day | ORAL | Status: DC
  Administered 2018-11-24 – 2018-11-26 (×3): 10 mg via ORAL
  Filled 2018-11-24 (×3): qty 1

## 2018-11-24 MED ORDER — ACETAZOLAMIDE 250 MG PO TABS
250.0000 mg | ORAL_TABLET | Freq: Two times a day (BID) | ORAL | Status: DC
  Administered 2018-11-24 – 2018-11-25 (×3): 250 mg via ORAL
  Filled 2018-11-24 (×3): qty 1

## 2018-11-24 NOTE — Consult Note (Addendum)
Advanced Heart Failure Team Consult Note   Primary Physician: [facility] PCP-Cardiologist: Dr [name]  Reason for Consultation: A/C systolic HF  HPI:    Nicholas Caldwell is seen today for evaluation of A/C systolic HF at the request of Dr Sloan Leiter.   Nicholas Caldwell is a 73 y.o. male PMH of chronic systolic CHF/nonischemic cardiomyopathy Echo 12/13/2016 LVEF 40-45%, COPD on home oxygen, PAF s/p DCCV 2018, and history of non compliance.   He was last seen in HF clinic 05/2018. He was doing okay. Lisinopril was increased. Weight was 274 lbs. He did not show up for 2 week follow up.   He has since been admitted twice. Admitted 9/22-10/1/19 with hypoxic and hypercapnic respiratory failure. Improved with BiPAP. HF team consulted for volume overload. Diuresed with IV lasix, then put on increased dose torsemide 60 mg BID. Underwent repeat DCCV 07/30/18. Course complicated by AKI that resolved by discharge.  Admitted 1/10-1/14/20 with lethargy. Found to have recurrent hypoxic and hypercapnic respiratory failure. Improved with BiPAP and diuresis. Home diuretics resumed. He was discharged on prednisone. No DC weight recorded.  He said he did not urinate a lot once he was home and weight went up on his scale 268 -> 284 lbs quickly. He was drinking a lot of fluid "because he was on prednisone". Denies any missed medications. +dry cough. +wheezing. No fever or chills. +orthopnea. No CP. Has a hard time getting around due to fatigue. He says he was too weak to come to follow up appointments. He has sleep study scheduled for 1/28. Continues to smoke 5 cigarettes/day.   He presented to [facility] on 1/[facility] with BLE edema x 3-4 days. He was hypoxic to 89% on home 3L, so he was placed on BiPAP. He was wheezing on exam. Treated with IV solu medrol for COPD exacerbation. He was started on IV lasix for volume overload.   Pertinent admission labs: K 4.1, creatinine 0.91, BNP 1739 (prior 606), hemoglobin 8.6, trop  0.02. EKG shows NSR. CXR: left basilar atelectasis and effusion  He has been diuresing well with 120 mg IV lasix TID. Weight is down 9 lbs in 2 days. Creatinine stable 1.0. He is on his home O2. Remains on solumedrol for COPD exacerbation. Had bradycardia down to 43 bpm overnight. Appears to be 2nd degree type 2 heart block for just a few beats.   He feels better. He is SOB with ADLs at baseline. Weight is back to previous dry weight.   Echo 04/2018: EF 40-45%, RV mildly dilated, PA peak pressure 62 mm Hg. LA mildy dilated.   Review of Systems: [y] = yes, _0  = no   General: Weight gain Blue.Reese ]; Weight loss _1 ; Anorexia _2 ; Fatigue [ y]; Fever _3 ; Chills _4 ; Weakness _5   Cardiac: Chest pain/pressure _6 ; Resting SOB _7 ; Exertional SOB Blue.Reese ]; Orthopnea Blue.Reese ]; Pedal Edema _8 y; Palpitations _9 ; Syncope _10 ; Presyncope _11 ; Paroxysmal nocturnal dyspnea_12   Pulmonary: Cough Blue.Reese ]; [facility] ]; Hemoptysis_13 ; Sputum _14 ; Snoring [ y]  GI: Vomiting_15 ; Dysphagia_16 ; Melena_17 ; Hematochezia _18 ; Heartburn_19 ; Abdominal pain _20 ; Constipation _21 ; Diarrhea _22 ; BRBPR _23   GU: Hematuria_24 ; Dysuria _25 ; Nocturia_26   Vascular: Pain in legs with walking _27 ; Pain in feet with lying flat _28 ; Non-healing sores _29 ; Stroke _30 ; TIA _31 ; Slurred speech _32 ;  Neuro:  Headaches_0 ; Vertigo_1 ; Seizures_2 ; Paresthesias_3 ;Blurred vision _4 ; Diplopia _5 ; Vision changes _6   Ortho/Skin: Arthritis _7 ; Joint pain [ y]; Muscle pain _8 ; Joint swelling _9 ; Back Pain _10 ; Rash _11   Psych: Depression_12 ; Anxiety_13   Heme: Bleeding problems _14 ; Clotting disorders _15 ; Anemia _16   Endocrine: Diabetes Blue.Reese ]; Thyroid dysfunction_17   Home Medications Prior to Admission medications   Medication Sig Start Date End Date Taking? Authorizing Provider  budesonide-formoterol (SYMBICORT) 160-4.5 MCG/ACT inhaler Inhale 2 puffs into the lungs 2 (two) times daily.   Yes [provider]  carvedilol (COREG) 6.25 MG  tablet Take 1 tablet (6.25 mg total) by mouth 2 (two) times daily with a meal. 08/02/18  Yes Regalado, Belkys A, MD  diazepam (VALIUM) 10 MG tablet Take 0.5 tablets (5 mg total) by mouth daily as needed for anxiety or sleep. Patient taking differently: Take 10 mg by mouth daily as needed for anxiety or sleep.  12/19/16  Yes Theodis Blaze, MD  ferrous sulfate 325 (65 FE) MG tablet Take 325 mg by mouth 3 (three) times daily as needed (for supplementation).   Yes [provider]  fluticasone (FLONASE) 50 MCG/ACT nasal spray Place 2 sprays into both nostrils daily as needed for allergies.    Yes [provider]  folic acid (FOLVITE) 1 MG tablet Take 1 mg by mouth daily.   Yes [provider]  gabapentin (NEURONTIN) 600 MG tablet Take 1 tablet (600 mg total) by mouth 2 (two) times daily. 12/19/16  Yes Theodis Blaze, MD  glucose 4 GM chewable tablet Chew 4 tablets by mouth See admin instructions. Chew 4 tablets by mouth as needed for low blood sugar - repeat every 15 minutes if blood sugar less than 70   Yes [provider]  guaifenesin (HUMIBID E) 400 MG TABS tablet Take 1 tablet (400 mg total) by mouth every 6 (six) hours as needed. Patient taking differently: Take 400 mg by mouth every 6 (six) hours as needed (cough).  08/02/18  Yes Regalado, Belkys A, MD  hydrocortisone (ANUSOL-HC) 2.5 % rectal cream Place 1 application rectally 2 (two) times daily as needed for hemorrhoids or itching.    Yes [provider]  hydrocortisone 1 % lotion Apply 1 application topically See admin instructions. Apply small amount to back twice daily for itchy rash   Yes [provider]  insulin aspart protamine - aspart (NOVOLOG MIX 70/30 FLEXPEN) (70-30) 100 UNIT/ML FlexPen Inject 0.05 mLs (5 Units total) into the skin 2 (two) times daily. Patient taking differently: Inject 30 Units into the skin 2 (two) times daily.  08/02/18  Yes Regalado, Belkys A, MD  ipratropium-albuterol  (DUONEB) 0.5-2.5 (3) MG/3ML SOLN Take 3 mLs by nebulization 2 (two) times daily.   Yes [provider]  latanoprost (XALATAN) 0.005 % ophthalmic solution Place 1 drop into both eyes at bedtime.   Yes [provider]  lisinopril (PRINIVIL,ZESTRIL) 20 MG tablet Take 1 tablet (20 mg total) by mouth daily. 05/27/18  Yes Georgiana Shore, NP  Magnesium Oxide 420 (252 Mg) MG TABS Take 420 mg by mouth 2 (two) times daily.   Yes [provider]  metFORMIN (GLUCOPHAGE-XR) 500 MG 24 hr tablet Take 1,000 mg by mouth 2 (two) times daily with a meal.   Yes [provider]  oxyCODONE-acetaminophen (PERCOCET) 10-325 MG tablet Take 1 tablet by mouth every 6 (six) hours as  needed for pain.   Yes [provider]  OXYGEN Inhale 3 L into the lungs continuous.    Yes [provider]  potassium chloride SA (K-DUR,KLOR-CON) 20 MEQ tablet Take 2 tablets (40 mEq total) by mouth daily. 11/07/14  Yes Buriev, Arie Sabina, MD  rivaroxaban (XARELTO) 20 MG TABS tablet Take 1 tablet (20 mg total) by mouth daily with supper. Patient taking differently: Take 20 mg by mouth daily at 6 PM.  01/20/17  Yes Jettie Booze, MD  rosuvastatin (CRESTOR) 20 MG tablet Take 1 tablet (20 mg total) by mouth daily. 05/07/18  Yes Matilde Haymaker, MD  spironolactone (ALDACTONE) 25 MG tablet Take 1 tablet (25 mg total) by mouth at bedtime. 02/04/18 02/04/19 Yes Tillery, Satira Mccallum, PA-C  torsemide (DEMADEX) 20 MG tablet Take 3 tablets (60 mg total) by mouth 2 (two) times daily. 08/02/18  Yes Regalado, Belkys A, MD  albuterol (PROVENTIL HFA;VENTOLIN HFA) 108 (90 BASE) MCG/ACT inhaler Inhale 2 puffs into the lungs every 6 (six) hours as needed for wheezing or shortness of breath. Patient not taking: Reported on 11/14/2018 11/07/14   Kinnie Feil, MD    Past Medical History: Past Medical History:  Diagnosis Date  . Atrial flutter ([place])    a. recurrent AFlutter with RVR;  b. Amiodarone Rx started 4/16    . CAD (coronary artery disease)    a. LHC 1/16:  mLAD diffuse disease, pLCx mild disease, dLCx with disease but too small for PCI, RCA ok, EF 25-30%  . Chronic pain   . Chronic systolic CHF (congestive heart failure) ([place])   . COPD (chronic obstructive pulmonary disease) ([place])   . Diabetes mellitus without complication ([place])   . Hypercholesteremia   . Hypertension   . NICM (nonischemic cardiomyopathy) ([place])    a.dx 2016. b. 2D echo 06/2016 - Last echo 07/01/16: mod dilated LV, mod LVH, EF 25-30%, mild-mod MR, sev LAE, mild-mod reduced RV systolic function, mild-mod TR, PASP 62mHG.  .Marland KitchenPAF (paroxysmal atrial fibrillation) ([facility])    On amio - ot a candidate for flecainide due to cardiomyopathy, not a candidate for Tikosyn due to prolonged QT, and felt to be a poor candidate for ablation given left atrial size.  . Pulmonary hypertension ([facility]   . Tobacco abuse     Past Surgical History: Past Surgical History:  Procedure Laterality Date  . CARDIOVERSION N/A 07/30/2018   Procedure: CARDIOVERSION;  Surgeon: MLarey Dresser MD;  [facility];  Service: Cardiovascular;  Laterality: N/A;  . LEFT AND RIGHT HEART CATHETERIZATION WITH CORONARY ANGIOGRAM N/A 11/06/2014   Procedure: LEFT AND RIGHT HEART CATHETERIZATION WITH CORONARY ANGIOGRAM;  Surgeon: JJettie Booze MD;  [facility];  Service: Cardiovascular;  Laterality: N/A;  . SPLENECTOMY      Family History: Family History  Problem Relation Age of Onset  . Heart disease Mother   . Hypertension Mother   . Heart failure Mother   . Heart disease Father   . Hypertension Sister   . Heart attack Neg Hx   . Stroke Neg Hx     Social History: Social History   Socioeconomic History  . Marital status: Married    Spouse name: Not on file  . Number of children: Not on file  . Years of education: Not on file  . Highest education level: Not on file  Occupational History  . Not on file  Social Needs  . Financial  resource strain: Not on file  .  Food insecurity:    Worry: Not on file    Inability: Not on file  . Transportation needs:    Medical: Not on file    Non-medical: Not on file  Tobacco Use  . Smoking status: Current Every Day Smoker    Packs/day: 1.00    Years: 34.00    Pack years: 34.00    Types: Cigarettes  . Smokeless tobacco: Never Used  Substance and Sexual Activity  . Alcohol use: No    Alcohol/week: 0.0 standard drinks  . Drug use: No  . Sexual activity: Not on file  Lifestyle  . Physical activity:    Days per week: Not on file    Minutes per session: Not on file  . Stress: Not on file  Relationships  . Social connections:    Talks on phone: Not on file    Gets together: Not on file    Attends religious service: Not on file    Active member of club or organization: Not on file    Attends meetings of clubs or organizations: Not on file    Relationship status: Not on file  Other Topics Concern  . Not on file  Social History Narrative  . Not on file    Allergies:  No Known Allergies  Objective:    Vital Signs:   Temp:  [98.3 F (36.8 C)-99 F (37.2 C)] 98.4 F (36.9 C) (01/22 0400) Pulse Rate:  [61-68] 62 (01/22 0400) Resp:  [18-22] 18 (01/22 0400) BP: (121-168)/(61-74) 140/74 (01/22 0400) SpO2:  [90 %-99 %] 95 % (01/22 0841) Weight:  [125 kg] 125 kg (01/22 0732) Last BM Date: 11/21/18  Weight change: Filed Weights   11/22/18 0257 11/24/18 0732  Weight: 129.2 kg 125 kg    Intake/Output:   Intake/Output Summary (Last 24 hours) at 11/24/2018 0925 Last data filed at 11/24/2018 0651 Gross per 24 hour  Intake 1342 ml  Output 3000 ml  Net -1658 ml      Physical Exam    General:  No resp difficulty. Sitting up in chair.  HEENT: normal Neck: supple. JVP difficult but does not appear elevated. Carotids 2+ bilat; no bruits. No lymphadenopathy or thyromegaly appreciated. Cor: PMI nondisplaced. Regular rate & rhythm. No rubs, gallops or  murmurs. Lungs: wheezing throughout. On 3 L [place].  Abdomen: soft, nontender, nondistended. No hepatosplenomegaly. No bruits or masses. Good bowel sounds. Extremities: no cyanosis, clubbing, rash, edema Neuro: alert & orientedx3, cranial nerves grossly intact. moves all 4 extremities w/o difficulty. Affect pleasant   Telemetry   NSR 50-60s. He had an episode overnight with brady down to 43 bpm. Looks like 2nd degree type 2. Personally reviewed.   EKG    NSR 61 bpm with incomplete RBBB. Personally reviewed.   Labs   Basic Metabolic Panel: Recent Labs  Lab 11/21/18 2238 11/22/18 0448 11/23/18 0506 11/24/18 0440  NA 138 143 140 141  K 4.1 4.1 3.1* 3.2*  CL 88* 90* 87* 85*  CO2 41* 42* 40* 43*  GLUCOSE 131* 130* 285* 211*  BUN _0 25*  CREATININE 0.91 0.89 1.08 1.00  CALCIUM 8.0* 8.3* 8.3* 8.4*  MG  --   --  1.8 1.9  PHOS  --   --  3.0  --     Liver Function Tests: Recent Labs  Lab 11/23/18 0506  AST 31  ALT 19  ALKPHOS 89  BILITOT 0.8  PROT 6.6  ALBUMIN 3.0*   No results for input(s):  LIPASE, AMYLASE in the last 168 hours. No results for input(s): AMMONIA in the last 168 hours.  CBC: Recent Labs  Lab 11/21/18 2238 11/22/18 0448 11/23/18 0506 11/24/18 0440  WBC 6.9 5.8 5.9 9.8  NEUTROABS 5.0  --   --   --   HGB 8.6* 8.8* 8.8* 9.4*  HCT 31.7* 30.8* 30.4* 32.1*  MCV 93.8 92.8 86.6 85.8  PLT 376 399 400 428*    Cardiac Enzymes: No results for input(s): CKTOTAL, CKMB, CKMBINDEX, TROPONINI in the last 168 hours.  BNP: BNP (last 3 results) Recent Labs    11/15/18 0122 11/16/18 0237 11/21/18 2238  BNP 1,279.5* 606.1* 1,739.7*    ProBNP (last 3 results) No results for input(s): PROBNP in the last 8760 hours.   CBG: Recent Labs  Lab 11/23/18 0829 11/23/18 1120 11/23/18 1710 11/23/18 2120 11/24/18 0804  GLUCAP 209* 253* 195* 268* 241*    Coagulation Studies: No results for input(s): LABPROT, INR in the last 72 hours.   Imaging     No results found.   Medications:     Current Medications: . carvedilol  6.25 mg Oral BID WC  . insulin aspart  0-5 Units Subcutaneous QHS  . insulin aspart  0-9 Units Subcutaneous TID WC  . ipratropium-albuterol  3 mL Nebulization TID  . latanoprost  1 drop Both Eyes QHS  . methylPREDNISolone (SOLU-MEDROL) injection  60 mg Intravenous Q12H  . mometasone-formoterol  2 puff Inhalation BID  . potassium chloride  40 mEq Oral TID  . rivaroxaban  20 mg Oral q1800  . rosuvastatin  20 mg Oral Daily  . sodium chloride flush  3 mL Intravenous Q12H  . spironolactone  25 mg Oral QHS     Infusions: . sodium chloride    . furosemide 120 mg (11/24/18 0527)       Patient Profile   Nicholas Caldwell is a 73 y.o. male PMH of chronic systolic CHF/nonischemic cardiomyopathy Echo 12/13/2016 LVEF 40-45%, COPD on home oxygen, PAF s/p DCCV 2018, and history of non compliance.   Admitted with BLE edema and wheezing. Admitted for COPD exacerbation and volume overload.   Assessment/Plan   1. Acute on chronic systolic CHF  - Echo 2/35/57 40-45%, Grade 2 DD, Mod LAE, Mild RV, Mod RAE, PA peak pressure 56 mm Hg.  - Echo 04/07/18: EF 40-45%, PA peak pressure 62 mmHg - Volume status much improved. Down 10 lbs and back to previous dry weight. Creatinine stable 1.0. He is still SOB on exertion, but this is his baseline.  - Will stop IV lasix after 2 pm dose today, then switch to torsemide 80 mg BID (increased from 60 mg BID). - Continue spiro 25 mg daily.  - Continue coreg 6.25 mg BID. HR 60s generally, but brady down to 43 overnight. See below.  - Restart lisinopril 10 mg daily. EF too high for Entresto.  2. Acute on chronic hypoxic Respiratory failure/COPD/?OSA - PFTs with restrictive lung disease.  - Follows with Dr Melvyn Novas outpatient.  - Back on home O2 3L - He has a sleep study on 1/28. Discussed importance of going to this.  - Continues to smoke. Discussed cessation.  - He has a dry cough. No  fever. Not on antibiotics. Femains on solu-medrol and duonebs.  3. Paroxysmal Afib - Maintaining NSR. Has required DCCV in past.  - Continue Xarelto 20 mg daily. Denies bleeding.  - CHA2DS2/VASc is 5.  - Has previously failed amioadrone and poor tikosyn candidate with long  QT. Now following with EP for possible long QT syndrome.  - Poor ablation candidate with LA size 4. Tobacco abuse  - Continues to smoke. Down to 5 cigarettes/day. Discussed cessation.  5. Mild nonobstructive CAD on LHC 2016 - Continue crestor 20 mg daily  - Continue Xarelto. No ASA - No s/s ischemia.  6. Heart block/bradycardia - One episode of bradycardia down to 43 bpm. Looks like second degree type 2. May need to cut back coreg some more. May be secondary to OSA.   Medication concerns reviewed with patient and pharmacy team. Barriers identified: compliance  I think he will be ready for DC home from HF perspective soon. He has a follow up appointment on 1/29 in HF clinic.   Length of Stay: [place], NP  11/24/2018, 9:25 AM  Advanced Heart Failure Team Pager [phone] (M-F; 7a - 4p)  Please contact [facility] for night-coverage after hours (4p -7a ) and weekends on [email or web address]   Patient seen and examined with the above-signed Advanced Practice Provider and/or Housestaff. I personally reviewed laboratory data, imaging studies and relevant notes. I independently examined the patient and formulated the important aspects of the plan. I have edited the note to reflect any of my changes or salient points. I have personally discussed the plan with the patient and/or family.  73 y/o male with multiple medical issued including systolic HF (EF 08-81%) and severe COPD on home O2 recently admitted for HF. Now readmitted with recurrent respiratory failure in the setting of AECOPD and 10 pound weight gain. Volume status now improving with diuresis and back down near baseline weight but still rhonchorous with diffuse  wheezing. Urine clear. Will continue IV diuresis for now while primary team treats AECOPD. He has contraction alkalosis on labs so will back lasix down to 120 IV bid and add diamox. Continue Xarelto for PAF. In NSR today.   Glori Bickers, MD  3:51 PM

## 2018-11-24 NOTE — Progress Notes (Signed)
PROGRESS NOTE        PATIENT DETAILS Name: Nicholas Caldwell Age: 73 y.o. Sex: male Date of Birth: 08-14-1946 Admit Date: 11/21/2018 Admitting Physician Phillips Grout, MD PHX:[id number], [place]  Brief Narrative: Patient is a 73 y.o. male systolic heart failure, PAF on anticoagulation, COPD with chronic hypoxic respiratory failure on 3 L of home O2-presented with some breath-found to have acute respiratory failure requiring BiPAP support secondary to decompensated heart failure and possible COPD exacerbation.  See below for further details  Subjective: Feels better-less swelling in his legs compared to just a few days back.  Assessment/Plan: Acute on chronic hypoxic/hypercarbic respiratory failure: Felt to be secondary to decompensated heart failure and COPD exacerbation.  Initially on BiPAP-but has now been liberated and doing well on 3 L of nasal cannula.  Acute on chronic systolic heart failure: Volume status is markedly improved-this is second admission this month alone-continue IV Lasix-have consulted CHF team for further assistance.  COPD exacerbation: Moving air well-history of some scattered rhonchi-change IV Solu-Medrol to prednisone-continue bronchodilators.  PAF: Maintaining sinus rhythm-continue Coreg-on Xarelto.  Hypertension: Blood pressure controlled-continue lisinopril and Coreg  Dyslipidemia: Continue statin  DM-2: CBG stable-continue SSI  Noncompliance: Counseled regarding importance of being compliant to medications and to follow-up with his outpatient MDs  DVT Prophylaxis: Full dose anticoagulation with Xarelto  Code Status: Full code  Family Communication: Spouse at bedside   Disposition Plan: Remain inpatient-but will plan on Home health vs SNF on discharge  Antimicrobial agents: Anti-infectives (From admission, onward)   None      Procedures: None  CONSULTS:  cardiology  Time spent: 25 minutes-Greater than 50%  of this time was spent in counseling, explanation of diagnosis, planning of further management, and coordination of care.  MEDICATIONS: Scheduled Meds: . carvedilol  6.25 mg Oral BID WC  . insulin aspart  0-5 Units Subcutaneous QHS  . insulin aspart  0-9 Units Subcutaneous TID WC  . ipratropium-albuterol  3 mL Nebulization TID  . latanoprost  1 drop Both Eyes QHS  . lisinopril  10 mg Oral Daily  . methylPREDNISolone (SOLU-MEDROL) injection  60 mg Intravenous Q12H  . mometasone-formoterol  2 puff Inhalation BID  . potassium chloride  40 mEq Oral TID  . rivaroxaban  20 mg Oral q1800  . rosuvastatin  20 mg Oral Daily  . sodium chloride flush  3 mL Intravenous Q12H  . spironolactone  25 mg Oral QHS  . torsemide  80 mg Oral BID   Continuous Infusions: . sodium chloride    . furosemide 120 mg (11/24/18 0527)   PRN Meds:.sodium chloride, albuterol, diazepam, ondansetron **OR** ondansetron (ZOFRAN) IV, oxyCODONE-acetaminophen **AND** oxyCODONE, sodium chloride flush   PHYSICAL EXAM: Vital signs: Vitals:   11/24/18 0400 11/24/18 0732 11/24/18 0841 11/24/18 0934  BP: 140/74   122/69  Pulse: 62   67  Resp: 18     Temp: 98.4 F (36.9 C)     TempSrc: Axillary     SpO2:   95%   Weight:  125 kg    Height:       Filed Weights   11/22/18 0257 11/24/18 0732  Weight: 129.2 kg 125 kg   Body mass index is 37.37 kg/m.   General appearance :Awake, alert, not in any distress. Speech Clear. Eyes:, pupils equally reactive to light and accomodation,no scleral icterus.Pink conjunctiva  HEENT: Atraumatic and Normocephalic Neck: supple, Resp:Good air entry bilaterally, scattered rhonchi CVS: S1 S2 regular GI: Bowel sounds present, Non tender and not distended with no gaurding, rigidity or rebound.No organomegaly Extremities: B/L Lower Ext shows trace edema, both legs are warm to touch Neurology:  speech clear,Non focal, sensation is grossly intact. Psychiatric: Normal judgment and insight.  Alert and oriented x 3. Normal mood. Musculoskeletal:No digital cyanosis Skin:No Rash, warm and dry Wounds:N/A  I have personally reviewed following labs and imaging studies  LABORATORY DATA: CBC: Recent Labs  Lab 11/21/18 2238 11/22/18 0448 11/23/18 0506 11/24/18 0440  WBC 6.9 5.8 5.9 9.8  NEUTROABS 5.0  --   --   --   HGB 8.6* 8.8* 8.8* 9.4*  HCT 31.7* 30.8* 30.4* 32.1*  MCV 93.8 92.8 86.6 85.8  PLT 376 399 400 428*    Basic Metabolic Panel: Recent Labs  Lab 11/21/18 2238 11/22/18 0448 11/23/18 0506 11/24/18 0440  NA 138 143 140 141  K 4.1 4.1 3.1* 3.2*  CL 88* 90* 87* 85*  CO2 41* 42* 40* 43*  GLUCOSE 131* 130* 285* 211*  BUN _0 25*  CREATININE 0.91 0.89 1.08 1.00  CALCIUM 8.0* 8.3* 8.3* 8.4*  MG  --   --  1.8 1.9  PHOS  --   --  3.0  --     GFR: Estimated Creatinine Clearance: 93.9 mL/min (by C-G formula based on SCr of 1 mg/dL).  Liver Function Tests: Recent Labs  Lab 11/23/18 0506  AST 31  ALT 19  ALKPHOS 89  BILITOT 0.8  PROT 6.6  ALBUMIN 3.0*   No results for input(s): LIPASE, AMYLASE in the last 168 hours. No results for input(s): AMMONIA in the last 168 hours.  Coagulation Profile: No results for input(s): INR, PROTIME in the last 168 hours.  Cardiac Enzymes: No results for input(s): CKTOTAL, CKMB, CKMBINDEX, TROPONINI in the last 168 hours.  BNP (last 3 results) No results for input(s): PROBNP in the last 8760 hours.  HbA1C: No results for input(s): HGBA1C in the last 72 hours.  CBG: Recent Labs  Lab 11/23/18 0829 11/23/18 1120 11/23/18 1710 11/23/18 2120 11/24/18 0804  GLUCAP 209* 253* 195* 268* 241*    Lipid Profile: No results for input(s): CHOL, HDL, LDLCALC, TRIG, CHOLHDL, LDLDIRECT in the last 72 hours.  Thyroid Function Tests: No results for input(s): TSH, T4TOTAL, FREET4, T3FREE, THYROIDAB in the last 72 hours.  Anemia Panel: No results for input(s): VITAMINB12, FOLATE, FERRITIN, TIBC, IRON, RETICCTPCT  in the last 72 hours.  Urine analysis:    Component Value Date/Time   COLORURINE YELLOW 12/13/2016 0420   APPEARANCEUR CLEAR 12/13/2016 0420   LABSPEC 1.010 12/13/2016 0420   PHURINE 5.0 12/13/2016 0420   GLUCOSEU >=500 (A) 12/13/2016 0420   HGBUR NEGATIVE 12/13/2016 0420   BILIRUBINUR NEGATIVE 12/13/2016 0420   KETONESUR NEGATIVE 12/13/2016 0420   PROTEINUR 100 (A) 12/13/2016 0420   UROBILINOGEN 0.2 10/26/2014 2206   NITRITE NEGATIVE 12/13/2016 0420   LEUKOCYTESUR NEGATIVE 12/13/2016 0420    Sepsis Labs: Lactic Acid, Venous    Component Value Date/Time   LATICACIDVEN 2.8 (HH) 12/13/2016 0338    MICROBIOLOGY: No results found for this or any previous visit (from the past 240 hour(s)).  RADIOLOGY STUDIES/RESULTS: Dg Chest Portable 1 View  Result Date: 11/21/2018 CLINICAL DATA:  sob, 89% on home 3L O2. Given 10 albuterol, 1 Atrovent and 125 solumedrol. Now at 100% on neb. Wheezing and rhonci in all lobes.  sob EXAM: PORTABLE CHEST 1 VIEW COMPARISON:  11/15/2010 FINDINGS: Enlarged cardiac silhouette. LEFT basilar atelectasis and effusion. Fine airspace disease. No interval change. IMPRESSION: No interval change in congestive heart failure pattern. Electronically Signed   By: Suzy Bouchard M.D.   On: 11/21/2018 23:16   Dg Chest Port 1 View  Result Date: 11/15/2018 CLINICAL DATA:  Pulmonary edema. EXAM: PORTABLE CHEST 1 VIEW COMPARISON:  11/14/2018.  11/12/2018. FINDINGS: Cardiomegaly with persistent bilateral pulmonary infiltrates/edema. Left base atelectasis. Left-sided pleural effusion. No pneumothorax. IMPRESSION: Cardiomegaly with persistent bilateral pulmonary infiltrates/edema. Left base atelectasis. Left-sided pleural effusion. Similar findings noted on prior exams. Electronically Signed   By: Marcello Moores  Register   On: 11/15/2018 07:14   Dg Chest Port 1 View  Result Date: 11/14/2018 CLINICAL DATA:  Pulmonary edema EXAM: PORTABLE CHEST 1 VIEW COMPARISON:  Chest radiograph  11/12/2018 FINDINGS: Monitoring leads overlie the patient. Stable cardiomegaly. Diffuse bilateral interstitial pulmonary opacities with persistent consolidation within the left mid and lower lung. Small to moderate left pleural effusion. No pneumothorax. IMPRESSION: Persistent consolidation within the left mid and lower lung which may represent or focal edema underlying a left effusion or potentially pneumonia. Diffuse bilateral interstitial opacities favored to represent edema. Cardiomegaly. Electronically Signed   By: Lovey Newcomer M.D.   On: 11/14/2018 07:43   Dg Chest Port 1 View  Result Date: 11/12/2018 CLINICAL DATA:  Shortness of breath EXAM: PORTABLE CHEST 1 VIEW COMPARISON:  10/26/2018 FINDINGS: Cardiomegaly with vascular congestion and moderate pulmonary edema. There are small pleural effusions. Aortic atherosclerosis. No pneumothorax. Persistent left lung base opacity. IMPRESSION: Cardiomegaly with vascular congestion and moderate diffuse interstitial opacities suspicious for pulmonary edema. More confluent hazy lung base opacities may reflect atelectasis or superimposed pneumonia. There are small pleural effusions. Electronically Signed   By: Donavan Foil M.D.   On: 11/12/2018 23:10   Dg Chest Portable 1 View  Result Date: 10/26/2018 CLINICAL DATA:  Shortness of breath. Cough. Wheezing. Hypoxia. Congestive heart failure. EXAM: PORTABLE CHEST 1 VIEW COMPARISON:  07/28/2018 FINDINGS: Stable cardiomegaly. Pulmonary opacity is again seen in the left lower lung, without significant change. Right lung remains clear. IMPRESSION: Stable pulmonary opacity in left lower lung. Stable cardiomegaly. Electronically Signed   By: Earle Gell M.D.   On: 10/26/2018 19:15     LOS: 2 days   Oren Binet, MD  [facility]  If 7PM-7AM, please contact night-coverage  Please page via [email or web address]-Password [email or web address]-click on MD name and type text message  11/24/2018, 11:09 AM

## 2018-11-24 NOTE — Evaluation (Signed)
Physical Therapy Evaluation Patient Details Name: Nicholas Caldwell MRN: [id number] DOB: 08-04-1946 Today's Date: 11/24/2018   History of Present Illness  Pt is a 73 y.o. male w/ a hx of medical noncompliance, A flutter, CAD, chronic systolic CHF, and COPD on 3 L O2 at home who was discharged the week prior for the same issue and returned to the ED w/ shortness of breath. His wife reported the swelling in his legs had gotten markedly worse since he was discharged.     Clinical Impression  Pt presented sitting OOB in recliner chair, awake and willing to participate in therapy session. Prior to admission, pt reported that he ambulated with use of RW and required assistance from wife for ADLs. Pt currently receives HHPT services. Pt limited this session secondary to R hip pain and fatigue. Pt currently at supervision level for bed mobility and transfers. Pt on 3L of O2 throughout with SPO2 maintaining at 91-92%. Pt would continue to benefit from skilled physical therapy services at this time while admitted and after d/c to address the below listed limitations in order to improve overall safety and independence with functional mobility.     Follow Up Recommendations Home health PT;Supervision - Intermittent    Equipment Recommendations  None recommended by PT    Recommendations for Other Services       Precautions / Restrictions Precautions Precautions: Fall Precaution Comments: monitor SPO2 Restrictions Weight Bearing Restrictions: No      Mobility  Bed Mobility Overal bed mobility: Needs Assistance Bed Mobility: Sit to Supine     Supine to sit: Supervision;HOB elevated Sit to supine: Supervision   General bed mobility comments: increased time and effort  Transfers Overall transfer level: Needs assistance Equipment used: Rolling walker (2 wheeled) Transfers: Sit to/from [employer] Sit to Stand: Supervision Stand pivot transfers: Supervision       General  transfer comment: supervision for safety  Ambulation/Gait             General Gait Details: pt deferring secondary to R hip pain and just recently ambulating with OT in hallway  Stairs            Wheelchair Mobility    Modified Rankin (Stroke Patients Only)       Balance Overall balance assessment: Needs assistance Sitting-balance support: No upper extremity supported;Feet supported Sitting balance-Leahy Scale: Good     Standing balance support: During functional activity;Bilateral upper extremity supported Standing balance-Leahy Scale: Poor Standing balance comment: reliant on UE support                             Pertinent Vitals/Pain Pain Assessment: Faces Faces Pain Scale: Hurts even more Pain Location: R hip Pain Descriptors / Indicators: Discomfort;Grimacing;Constant Pain Intervention(s): Monitored during session    Home Living Family/patient expects to be discharged to:: Private residence Living Arrangements: Spouse/significant other Available Help at Discharge: Family;Available 24 hours/day Type of Home: House Home Access: Stairs to enter Entrance Stairs-Rails: None Entrance Stairs-Number of Steps: 1 Home Layout: One level Home Equipment: Walker - 2 wheels;Cane - single point;Bedside commode;Shower seat Additional Comments: on 3L of O2 at all times at home    Prior Function Level of Independence: Needs assistance   Gait / Transfers Assistance Needed: uses RW for mobility  ADL's / Homemaking Assistance Needed: assist from wife for LB ADLs and IADLs        Hand Dominance   Dominant Hand: Right  Extremity/Trunk Assessment   Upper Extremity Assessment Upper Extremity Assessment: Overall WFL for tasks assessed;Defer to OT evaluation    Lower Extremity Assessment Lower Extremity Assessment: Generalized weakness    Cervical / Trunk Assessment Cervical / Trunk Assessment: Other exceptions Cervical / Trunk Exceptions:  Increased body habitus  Communication   Communication: No difficulties  Cognition Arousal/Alertness: Awake/alert Behavior During Therapy: WFL for tasks assessed/performed Overall Cognitive Status: Within Functional Limits for tasks assessed                                        General Comments General comments (skin integrity, edema, etc.): Wife present throughout    Exercises     Assessment/Plan    PT Assessment Patient needs continued PT services  PT Problem List Decreased strength;Decreased activity tolerance;Decreased balance;Decreased coordination;Decreased mobility;Decreased knowledge of use of DME;Decreased safety awareness;Decreased knowledge of precautions;Pain       PT Treatment Interventions DME instruction;Gait training;Stair training;Functional mobility training;Therapeutic activities;Balance training;Therapeutic exercise;Neuromuscular re-education;Patient/family education    PT Goals (Current goals can be found in the Care Plan section)  Acute Rehab PT Goals Patient Stated Goal: return home today PT Goal Formulation: With patient Time For Goal Achievement: 12/08/18 Potential to Achieve Goals: Good    Frequency Min 3X/week   Barriers to discharge        Co-evaluation               AM-PAC PT "6 Clicks" Mobility  Outcome Measure Help needed turning from your back to your side while in a flat bed without using bedrails?: None Help needed moving from lying on your back to sitting on the side of a flat bed without using bedrails?: None Help needed moving to and from a bed to a chair (including a wheelchair)?: None Help needed standing up from a chair using your arms (e.g., wheelchair or bedside chair)?: None Help needed to walk in hospital room?: A Little Help needed climbing 3-5 steps with a railing? : A Lot 6 Click Score: 21    End of Session Equipment Utilized During Treatment: Oxygen Activity Tolerance: Patient limited by  pain Patient left: in bed;with call bell/phone within reach;with bed alarm set;with SCD's reapplied Nurse Communication: Mobility status PT Visit Diagnosis: Other abnormalities of gait and mobility (R26.89);Muscle weakness (generalized) (M62.81)    Time: 2482-5003 PT Time Calculation (min) (ACUTE ONLY): 17 min   Charges:   PT Evaluation $PT Eval Moderate Complexity: 1 Mod          Sherie Don, PT, DPT  Acute Rehabilitation Services Pager [phone] Office [place] 11/24/2018, 12:51 PM

## 2018-11-24 NOTE — Evaluation (Signed)
Occupational Therapy Evaluation Patient Details Name: Nicholas Caldwell MRN: [id number] DOB: 1946/06/17 Today's Date: 11/24/2018    History of Present Illness 73 y.o. male w/ a hx of medical noncompliance, A flutter, CAD, chronic systolic CHF, and COPD on 3 L O2 at home who was discharged the week prior for the same issue and returned to the ED w/ shortness of breath. His wife reported the swelling in his legs had gotten markedly worse since he was discharged.    Clinical Impression   PTA, pt was living with his wife who assisted pt with LB ADLs and IADLs. Pt using RW and 3-4L O2 at home. Pt currently requiring Mod-Max A for LB ADLs and Min Guard A for functional mobility with RW. Pt dropping to 84% on 3L O3 during bed mobility and elevated him to 4L O2. During mobility in hallway, pt requiring 6L O2 to keep SpO2 >90%. Pt denies SOB, however presents with fatigue and requires rest breaks. Pt would benefit from further acute OT to address endurance during ADLs and facilitate safe dc. Recommend dc to home once medically stable per physician.    Follow Up Recommendations  No OT follow up;Supervision/Assistance - 24 hour    Equipment Recommendations  None recommended by OT    Recommendations for Other Services PT consult     Precautions / Restrictions Precautions Precautions: Fall      Mobility Bed Mobility Overal bed mobility: Needs Assistance Bed Mobility: Supine to Sit     Supine to sit: Supervision;HOB elevated     General bed mobility comments: Increased time and effort. HOB elevated significantly  Transfers Overall transfer level: Needs assistance Equipment used: Rolling walker (2 wheeled) Transfers: Sit to/from Stand Sit to Stand: Min guard         General transfer comment: Min Guard A for safety    Balance Overall balance assessment: Needs assistance Sitting-balance support: No upper extremity supported;Feet supported Sitting balance-Leahy Scale: Good      Standing balance support: During functional activity;Bilateral upper extremity supported Standing balance-Leahy Scale: Poor Standing balance comment: reliant on UE support                           ADL either performed or assessed with clinical judgement   ADL Overall ADL's : Needs assistance/impaired Eating/Feeding: Supervision/ safety;Set up;Sitting   Grooming: Supervision/safety;Set up;Standing   Upper Body Bathing: Supervision/ safety;Set up;Sitting   Lower Body Bathing: Moderate assistance;Sit to/from stand   Upper Body Dressing : Sitting;Supervision/safety;Set up Upper Body Dressing Details (indicate cue type and reason): Donning second gown like a jacket with supervision Lower Body Dressing: Maximal assistance;Sit to/from stand Lower Body Dressing Details (indicate cue type and reason): Pt reporting his wife assists with socks and LB dressing Toilet Transfer: Min guard;RW;Ambulation(simulated to recliner) Toilet Transfer Details (indicate cue type and reason): Min Guard A for safety. Performed after functional mobility in hallway         Functional mobility during ADLs: Min guard;Rolling walker General ADL Comments: SpO2 dropping to 84% on 3L during bed mobility. Elevated O2 to 4L. Pt performing functional mobility in hallway and dropping to 87% on 4L. Elevated to 5 and then 6L O2; pt then staying in 90s throughout mobility.      Vision         Perception     Praxis      Pertinent Vitals/Pain Pain Assessment: Faces Faces Pain Scale: Hurts little more Pain Location: R hip  Pain Descriptors / Indicators: Discomfort;Grimacing;Constant Pain Intervention(s): Monitored during session;Repositioned     Hand Dominance Right   Extremity/Trunk Assessment Upper Extremity Assessment Upper Extremity Assessment: Overall WFL for tasks assessed   Lower Extremity Assessment Lower Extremity Assessment: Defer to PT evaluation   Cervical / Trunk  Assessment Cervical / Trunk Assessment: Other exceptions Cervical / Trunk Exceptions: Increased body habitus   Communication Communication Communication: No difficulties   Cognition Arousal/Alertness: Awake/alert Behavior During Therapy: WFL for tasks assessed/performed Overall Cognitive Status: Within Functional Limits for tasks assessed                                     General Comments  Wife present throughout    Exercises     Shoulder Instructions      Home Living Family/patient expects to be discharged to:: Private residence Living Arrangements: Spouse/significant other Available Help at Discharge: Family;Available 24 hours/day Type of Home: House Home Access: Stairs to enter [employer] of Steps: 1 Entrance Stairs-Rails: None Home Layout: One level     Bathroom Shower/Tub: Teacher, early years/pre: Standard Bathroom Accessibility: Yes   Home Equipment: Environmental consultant - 2 wheels;Cane - single point;Bedside commode;Shower seat   Additional Comments: on 3L of O2 at all times at home      Prior Functioning/Environment Level of Independence: Needs assistance  Gait / Transfers Assistance Needed: uses RW for mobility ADL's / Homemaking Assistance Needed: assist from wife for LB ADLs and IADLs            OT Problem List: Decreased strength;Decreased range of motion;Decreased activity tolerance;Impaired balance (sitting and/or standing);Decreased knowledge of use of DME or AE;Decreased knowledge of precautions;Pain      OT Treatment/Interventions: Self-care/ADL training;Therapeutic exercise;Energy conservation;DME and/or AE instruction;Therapeutic activities;Patient/family education    OT Goals(Current goals can be found in the care plan section) Acute Rehab OT Goals Patient Stated Goal: return home today OT Goal Formulation: With patient/family Time For Goal Achievement: 12/08/18 Potential to Achieve Goals: Good  OT Frequency:  Min 2X/week   Barriers to D/C:            Co-evaluation              AM-PAC OT "6 Clicks" Daily Activity     Outcome Measure Help from another person eating meals?: None Help from another person taking care of personal grooming?: A Little Help from another person toileting, which includes using toliet, bedpan, or urinal?: A Little Help from another person bathing (including washing, rinsing, drying)?: A Lot Help from another person to put on and taking off regular upper body clothing?: A Little Help from another person to put on and taking off regular lower body clothing?: A Lot 6 Click Score: 17   End of Session Equipment Utilized During Treatment: Rolling walker;Oxygen Nurse Communication: Mobility status  Activity Tolerance: Patient tolerated treatment well Patient left: in chair;with call bell/phone within reach;with chair alarm set;with family/visitor present  OT Visit Diagnosis: Unsteadiness on feet (R26.81);Other abnormalities of gait and mobility (R26.89);Muscle weakness (generalized) (M62.81);Pain Pain - Right/Left: Right Pain - part of body: Hip                Time: 5170-0174 OT Time Calculation (min): 19 min Charges:  OT General Charges $OT Visit: 1 Visit OT Evaluation $OT Eval Low Complexity: Independence, OTR/L Acute Rehab Pager: [phone] Office: [phone]  Lianne Bushy  M Alvita Fana 11/24/2018, 9:28 AM

## 2018-11-24 NOTE — Progress Notes (Signed)
Inpatient Diabetes Program Recommendations  AACE/ADA: New Consensus Statement on Inpatient Glycemic Control (2015)  Target Ranges:  Prepandial:   less than 140 mg/dL      Peak postprandial:   less than 180 mg/dL (1-2 hours)      Critically ill patients:  140 - 180 mg/dL   Results for Nicholas Caldwell, Nicholas Caldwell (MRN [id number]) as of 11/24/2018 10:49  Ref. Range 11/23/2018 08:29 11/23/2018 11:20 11/23/2018 17:10 11/23/2018 21:20 11/24/2018 08:04  Glucose-Capillary Latest Ref Range: 70 - 99 mg/dL 209 (H) 253 (H) 195 (H) 268 (H) 241 (H)   Review of Glycemic Control  Diabetes history: DM2 Outpatient Diabetes medications: 70/30 30 units BID, Metformin XR 1000 mg BID Current orders for Inpatient glycemic control: Novolog 0-9 units TID with meals, Novolog 0-5 units QHS; Solumedrol 60 mg Q12H  Inpatient Diabetes Program Recommendations:   Insulin - Basal: If Solumedrol is continued, please consider ordering Lantus 10 units Q24H.  Insulin - Meal Coverage: If Solumedrol is continued, please consider ordering Novolog 3 units TID with meals for meal coverage if patient eats at least 50% of meals.  Thanks, Barnie Alderman, RN, MSN, CDE Diabetes Coordinator Inpatient Diabetes Program [phone] (Team Pager from 8am to 5pm)

## 2018-11-25 DIAGNOSIS — J9622 Acute and chronic respiratory failure with hypercapnia: Secondary | ICD-10-CM

## 2018-11-25 DIAGNOSIS — J441 Chronic obstructive pulmonary disease with (acute) exacerbation: Secondary | ICD-10-CM

## 2018-11-25 DIAGNOSIS — I5043 Acute on chronic combined systolic (congestive) and diastolic (congestive) heart failure: Secondary | ICD-10-CM

## 2018-11-25 LAB — BASIC METABOLIC PANEL
Anion gap: 9 (ref 5–15)
BUN: 26 mg/dL — ABNORMAL HIGH (ref 8–23)
CHLORIDE: 90 mmol/L — AB (ref 98–111)
CO2: 40 mmol/L — AB (ref 22–32)
Calcium: 8.3 mg/dL — ABNORMAL LOW (ref 8.9–10.3)
Creatinine, Ser: 1.12 mg/dL (ref 0.61–1.24)
GFR calc Af Amer: >60 mL/min (ref >60)
GFR calc non Af Amer: >60 mL/min (ref >60)
Glucose, Bld: 162 mg/dL — ABNORMAL HIGH (ref 70–99)
Potassium: 3 mmol/L — ABNORMAL LOW (ref 3.5–5.1)
Sodium: 139 mmol/L (ref 135–145)

## 2018-11-25 LAB — GLUCOSE, CAPILLARY
Glucose-Capillary: 237 mg/dL — ABNORMAL HIGH (ref 70–99)
Glucose-Capillary: 202 mg/dL — ABNORMAL HIGH (ref 70–99)
Glucose-Capillary: 246 mg/dL — ABNORMAL HIGH (ref 70–99)
Glucose-Capillary: 240 mg/dL — ABNORMAL HIGH (ref 70–99)

## 2018-11-25 MED ORDER — IPRATROPIUM-ALBUTEROL 0.5-2.5 (3) MG/3ML IN SOLN
3.0000 mL | Freq: Three times a day (TID) | RESPIRATORY_TRACT | Status: DC
  Filled 2018-11-25: qty 3

## 2018-11-25 MED ORDER — POTASSIUM CHLORIDE CRYS ER 20 MEQ PO TBCR
60.0000 meq | EXTENDED_RELEASE_TABLET | Freq: Once | ORAL | Status: AC
  Administered 2018-11-25: 60 meq via ORAL
  Filled 2018-11-25: qty 3

## 2018-11-25 MED ORDER — BUDESONIDE 0.25 MG/2ML IN SUSP
0.2500 mg | Freq: Two times a day (BID) | RESPIRATORY_TRACT | Status: DC
  Administered 2018-11-25 – 2018-11-26 (×3): 0.25 mg via RESPIRATORY_TRACT
  Filled 2018-11-25 (×3): qty 2

## 2018-11-25 MED ORDER — GUAIFENESIN ER 600 MG PO TB12
600.0000 mg | ORAL_TABLET | Freq: Two times a day (BID) | ORAL | Status: DC
  Administered 2018-11-25 – 2018-11-26 (×3): 600 mg via ORAL
  Filled 2018-11-25 (×3): qty 1

## 2018-11-25 MED ORDER — FUROSEMIDE 10 MG/ML IJ SOLN
120.0000 mg | Freq: Once | INTRAVENOUS | Status: AC
  Administered 2018-11-25: 120 mg via INTRAVENOUS
  Filled 2018-11-25: qty 10

## 2018-11-25 MED ORDER — METHYLPREDNISOLONE SODIUM SUCC 40 MG IJ SOLR
40.0000 mg | Freq: Two times a day (BID) | INTRAMUSCULAR | Status: DC
  Administered 2018-11-25 (×2): 40 mg via INTRAVENOUS
  Filled 2018-11-25 (×2): qty 1

## 2018-11-25 MED ORDER — TORSEMIDE 20 MG PO TABS
80.0000 mg | ORAL_TABLET | Freq: Two times a day (BID) | ORAL | Status: DC
  Administered 2018-11-25 – 2018-11-26 (×2): 80 mg via ORAL
  Filled 2018-11-25 (×3): qty 4

## 2018-11-25 MED ORDER — IPRATROPIUM-ALBUTEROL 0.5-2.5 (3) MG/3ML IN SOLN
3.0000 mL | Freq: Four times a day (QID) | RESPIRATORY_TRACT | Status: DC
  Administered 2018-11-25 – 2018-11-26 (×4): 3 mL via RESPIRATORY_TRACT
  Filled 2018-11-25 (×4): qty 3

## 2018-11-25 NOTE — Progress Notes (Addendum)
Advanced Heart Failure Rounding Note  PCP-Cardiologist: No primary care provider on file.   Subjective:    Received 120 mg IV lasix BID + diamox yesterday. I/O negative 2.2 L. Creatinine stable 1.12. K 3.0. Repeat weight is 267 lbs, down 6 more lbs (17 lbs total). Bicarb 40.  Sinus bradycardia down to 49 several times overnight, one episode down to 40 with extra p-waves (2nd degree HB type 2).  Feels fine. Denies SOB when ambulating. Has some chest congestion and dry cough still.   Objective:   Weight Range: 132.6 kg Body mass index is 39.65 kg/m.   Vital Signs:   Temp:  [97.7 F (36.5 C)-98.2 F (36.8 C)] 97.7 F (36.5 C) (01/23 0507) Pulse Rate:  [46-67] 64 (01/23 0507) Resp:  [14-18] 16 (01/23 0507) BP: (115-122)/(55-70) 115/62 (01/23 0507) SpO2:  [92 %-100 %] 95 % (01/23 0838) Weight:  [123.6 kg-132.6 kg] 132.6 kg (01/23 0217) Last BM Date: 11/21/18  Weight change: Filed Weights   11/24/18 0732 11/25/18 0209 11/25/18 0217  Weight: 125 kg 123.6 kg 132.6 kg    Intake/Output:   Intake/Output Summary (Last 24 hours) at 11/25/2018 0843 Last data filed at 11/25/2018 0209 Gross per 24 hour  Intake 568 ml  Output 2800 ml  Net -2232 ml      Physical Exam    General:  Sitting up in chair No resp difficulty HEENT: Normal Neck: Supple. JVP difficult but does not appear elevated. Carotids 2+ bilat; no bruits. No lymphadenopathy or thyromegaly appreciated. Cor: PMI nondisplaced. Regular rate & rhythm. No rubs, gallops or murmurs. Lungs: expiratory wheezes. +dry cough. On 3 L [place].  Abdomen: obese, soft, nontender, nondistended. No hepatosplenomegaly. No bruits or masses. Good bowel sounds. Extremities: No cyanosis, clubbing, rash, edema Neuro: Alert & orientedx3, cranial nerves grossly intact. moves all 4 extremities w/o difficulty. Affect pleasant   Telemetry   NSR 60s, brady down to 49 overnight while sleeping. One episode down to 40 with extra p waves.  Personally reviewed.   EKG    No new tracings.   Labs    CBC Recent Labs    11/23/18 0506 11/24/18 0440  WBC 5.9 9.8  HGB 8.8* 9.4*  HCT 30.4* 32.1*  MCV 86.6 85.8  PLT 400 767*   Basic Metabolic Panel Recent Labs    11/23/18 0506 11/24/18 0440 11/25/18 0408  NA 140 141 139  K 3.1* 3.2* 3.0*  CL 87* 85* 90*  CO2 40* 43* 40*  GLUCOSE 285* 211* 162*  BUN 22 25* 26*  CREATININE 1.08 1.00 1.12  CALCIUM 8.3* 8.4* 8.3*  MG 1.8 1.9  --   PHOS 3.0  --   --    Liver Function Tests Recent Labs    11/23/18 0506  AST 31  ALT 19  ALKPHOS 89  BILITOT 0.8  PROT 6.6  ALBUMIN 3.0*   No results for input(s): LIPASE, AMYLASE in the last 72 hours. Cardiac Enzymes No results for input(s): CKTOTAL, CKMB, CKMBINDEX, TROPONINI in the last 72 hours.  BNP: BNP (last 3 results) Recent Labs    11/15/18 0122 11/16/18 0237 11/21/18 2238  BNP 1,279.5* 606.1* 1,739.7*    ProBNP (last 3 results) No results for input(s): PROBNP in the last 8760 hours.   D-Dimer No results for input(s): DDIMER in the last 72 hours. Hemoglobin A1C No results for input(s): HGBA1C in the last 72 hours. Fasting Lipid Panel No results for input(s): CHOL, HDL, LDLCALC, TRIG, CHOLHDL, LDLDIRECT in  the last 72 hours. Thyroid Function Tests No results for input(s): TSH, T4TOTAL, T3FREE, THYROIDAB in the last 72 hours.  Invalid input(s): FREET3  Other results:   Imaging     No results found.   Medications:     Scheduled Medications: . acetaZOLAMIDE  250 mg Oral BID  . budesonide (PULMICORT) nebulizer solution  0.25 mg Nebulization BID  . carvedilol  6.25 mg Oral BID WC  . guaiFENesin  600 mg Oral BID  . insulin aspart  0-5 Units Subcutaneous QHS  . insulin aspart  0-9 Units Subcutaneous TID WC  . ipratropium-albuterol  3 mL Nebulization Q6H  . latanoprost  1 drop Both Eyes QHS  . lisinopril  10 mg Oral Daily  . methylPREDNISolone (SOLU-MEDROL) injection  40 mg Intravenous BID  .  potassium chloride  40 mEq Oral TID  . rivaroxaban  20 mg Oral q1800  . rosuvastatin  20 mg Oral Daily  . sodium chloride flush  3 mL Intravenous Q12H  . spironolactone  25 mg Oral QHS     Infusions: . sodium chloride    . furosemide       PRN Medications:  sodium chloride, albuterol, diazepam, ondansetron **OR** ondansetron (ZOFRAN) IV, oxyCODONE-acetaminophen **AND** oxyCODONE, sodium chloride flush    Patient Profile   Dylon Correa a 73 y.o.malePMH of chronic systolic CHF/nonischemic cardiomyopathy Echo 12/13/2016 LVEF 40-45%, COPD on home oxygen, PAF s/p DCCV 2018, and history of non compliance.   Admitted with BLE edema and wheezing. Admitted for COPD exacerbation and volume overload.   Assessment/Plan   1. Acute on chronic systolic CHF  - Echo 0/34/74 40-45%, Grade 2 DD, Mod LAE, Mild RV, Mod RAE, PA peak pressure 56 mm Hg. - Echo 04/07/18: EF 40-45%, PA peak pressure 62 mmHg - Volume status improved. Down 17 lbs and now below previous dry weight of 274 lbs (he is 267 lbs today). Creatinine stable 1.12.  - Give at least am dose of lasix. Transition to higher dose of torsemide 80 mg BID this afternoon. He was on 60 mg BID PTA.  - Continue diamox for now. Bicarb 43 -> 40 -Continuespiro 25 mg daily.  - Continue coreg 6.25 mg BID. HR 60s generally while awake. He had a few episodes of bradycardia while sleeping. See below.  -Continue lisinopril 10 mg daily. EF too high for Entresto.  2. Acute on chronic hypoxic Respiratory failure/COPD/?OSA - PFTs with restrictive lung disease.  - Follows with Dr Melvyn Novas outpatient.  - Back on home O2 3L - He has a sleep study on 1/28. Discussed importance of going to this.  - Continues to smoke. Discussed cessation.  - Still has a dry cough. No fever. Not on antibiotics. Femains on solu-medrol and duonebs.  3. Paroxysmal Afib - Maintaining NSR. Has required DCCV in past.  - Continue Xarelto 20 mg daily. Denies bleeding.  -  CHA2DS2/VASc is 5.  - Has previously failed amiodarone and poor tikosyn candidate with long QT. Now following with EP for possible long QT syndrome.  - Poor ablation candidate with LA size 4. Tobacco abuse  - Continues to smoke. Down to 5 cigarettes/day. Discussed cessation. No change.  5. Mild nonobstructive CAD on LHC 2016 - Continue crestor 20 mg daily  - Continue Xarelto. No ASA - No s/s ischemia.  6. Heart block/bradycardia - Bradycardia overnight while sleeping. I think this is related to OSA. He is getting a sleep study next week. May need to decrease coreg until after OSA  is treated.  7. Hypokalemia - K 3.0. Supp.   Medication concerns reviewed with patient and pharmacy team. Barriers identified: compliance  I think he can go home soon from HF standpoint. He has HF f/u arranged for next week.   Length of Stay: [place], NP  11/24/2018, 9:25 AM  Advanced Heart Failure Team Pager [phone] (M-F; 7a - 4p)  Please contact [facility] for night-coverage after hours (4p -7a ) and weekends on [email or web address]  Patient seen with NP, agree with the above note.   He continues to diurese well, breathing is improving.  He remains in NSR.   On exam, JVP 8 cm.  Still some rhonchi on lung exam.  No edema.    He got 1 dose of IV Lasix this morning, will transition to torsemide 80 mg bid. Can stop acetazolamide.  Continue current Coreg, lisinopril, and spironolactone.  With EF 40-45% on last echo, would not transition to [place].   HR down to 40s, sinus brady, while asleep.  Suspect related to OSA/vagal tone.  Would not change Coreg dose as HR does not drop during the day.  Will need outpatient sleep study.   Continue tx of COPD exacerbation per primary service.   Loralie Champagne 11/25/2018 1:04 PM

## 2018-11-25 NOTE — Progress Notes (Signed)
Occupational Therapy Treatment Patient Details Name: Nicholas Caldwell MRN: [id number] DOB: 1946/06/30 Today's Date: 11/25/2018    History of present illness Pt is a 73 y.o. male w/ a hx of medical noncompliance, A flutter, CAD, chronic systolic CHF, and COPD on 3 L O2 at home who was discharged the week prior for the same issue and returned to the ED w/ shortness of breath. His wife reported the swelling in his legs had gotten markedly worse since he was discharged.    OT comments  Session limited by fatigue. Pt states he did not sleep well last night. Session focused on educaioin regarding energy conservation strategies. encouraged pt ot ambulate with nsg once he is rested. Recommend follow up with [place].   Follow Up Recommendations  Home health OT    Equipment Recommendations  None recommended by OT    Recommendations for Other Services PT consult    Precautions / Restrictions Precautions Precautions: Fall Precaution Comments: monitor SPO2       Mobility Bed Mobility Overal bed mobility: Modified Independent                Transfers                 General transfer comment: Pt declined    Balance                                           ADL either performed or assessed with clinical judgement   ADL Overall ADL's : Needs assistance/impaired                                       General ADL Comments: Educated on energy conservation strategies. Pt staes he had a "lady comiong by to show him how to do things in a better way". Pt limited by fatigue     Vision       Perception     Praxis      Cognition Arousal/Alertness: Lethargic;Suspect due to medications Behavior During Therapy: Flat affect Overall Cognitive Status: Within Functional Limits for tasks assessed                                          Exercises     Shoulder Instructions       General Comments      Pertinent Vitals/ Pain        Pain Assessment: Faces Faces Pain Scale: Hurts little more Pain Location: R hip Pain Descriptors / Indicators: Discomfort;Grimacing;Constant Pain Intervention(s): Limited activity within patient's tolerance  Home Living                                          Prior Functioning/Environment              Frequency  Min 2X/week        Progress Toward Goals  OT Goals(current goals can now be found in the care plan section)  Progress towards OT goals: Progressing toward goals  Acute Rehab OT Goals Patient Stated Goal: to go home soon OT Goal Formulation: With patient/family Time For Goal  Achievement: 12/08/18 Potential to Achieve Goals: Good ADL Goals Pt Will Perform Grooming: with modified independence;standing Pt Will Perform Lower Body Dressing: with set-up;with supervision;sit to/from stand;with adaptive equipment Pt Will Transfer to Toilet: with modified independence;ambulating;regular height toilet Pt Will Perform Tub/Shower Transfer: Tub transfer;rolling walker;ambulating;shower seat;with min guard assist Additional ADL Goal #1: Pt will verbalized three energy conservation activities independently  Plan Discharge plan needs to be updated    Co-evaluation                 AM-PAC OT "6 Clicks" Daily Activity     Outcome Measure   Help from another person eating meals?: None Help from another person taking care of personal grooming?: A Little Help from another person toileting, which includes using toliet, bedpan, or urinal?: A Little Help from another person bathing (including washing, rinsing, drying)?: A Lot Help from another person to put on and taking off regular upper body clothing?: A Little Help from another person to put on and taking off regular lower body clothing?: A Lot 6 Click Score: 17    End of Session Equipment Utilized During Treatment: Oxygen(3L)  OT Visit Diagnosis: Unsteadiness on feet (R26.81);Other abnormalities  of gait and mobility (R26.89);Muscle weakness (generalized) (M62.81);Pain Pain - Right/Left: Right Pain - part of body: Hip   Activity Tolerance Patient limited by fatigue   Patient Left in bed;with call bell/phone within reach   Nurse Communication Mobility status        Time: 1450-1500 OT Time Calculation (min): 10 min  Charges: OT General Charges $OT Visit: 1 Visit OT Treatments $Self Care/Home Management : 8-22 mins  Maurie Boettcher, OT/L   Acute OT Clinical Specialist [place] Pager [phone] Office [phone]    [facility] 11/25/2018, 3:08 PM

## 2018-11-25 NOTE — Progress Notes (Addendum)
Patient had a 4 and 7 beat run of wide QRS.   Patient is asymptomatic.  Patient is resting quietly.  Notified Lamar Blinks, NP.  Will continue to monitor the patient and notify as needed

## 2018-11-25 NOTE — Progress Notes (Signed)
PROGRESS NOTE  Nicholas Caldwell SAY:[id number] DOB: [birth year] DOA: 11/21/2018 PCP: [facility]  HPI/Brief Narrative   Nicholas Caldwell is a 73 y.o. year old male with medical history significant for chronic systolic CHF (last EF 23-55% 04/2018), COPD on 3L of home O2, HTN, Paroxysmal A fib, who presented on 11/21/2018 with increased SOB and BLE edema and was admitted for management of acute on chronic systolic CHF and acute on chronic respiratory failure. He is a current smoker.  Hospital Course: Patient was initially started on BiPAP for his SOB and is now transitioned to his home baseline of 3 L of nasal cannula O2. He has been followed by the Advanced Heart Failure team. His volume status has significantly improved during admission. He has been receiving diuresis with IV Lasix 120 mg, Diamox, and Spironolactone. He is being transitioned to Torsemide 80 mg BID today.   Subjective Patient states he is overall doing well today. His SOB has improved. He is on his baseline of 3 L nasal cannula O2. He is still experiencing a cough. He states he was able to get up and walk yesterday. He denies any CP or palpitations.   Assessment/Plan:  1. Acute on chronic hypoxic Respiratory Failure-Improving. His respiratory failure was likely due to COPD exacerbation or CHF exacerbation. Patient was initially on BiPAP on admission, but is now on his baseline of 3 L of nasal cannula O2 with stable O2 sat above 90%. He was able to get up and ambulate yesterday without difficulty. Will continue nebulizer treatments as he continues to have a cough with some wheezing. Patient is scheduled for sleep study on 11/30/18 to evaluate for possible OSA.   2. Acute on chronic systolic CHF, EF 73-22%(GURK 04/2018)- Improving. His volume status continues to improve. His weight is down 17 lbs. His net I's and O's from 1/22 was -2.2 L. He is being followed by the advanced heart failure team, who state he is stable for d/c from their  standpoint and is scheduled for outpatient f/u with them next week. He was receiving 120 mg IV Lasix BID and is being transitioned to Torsemide 80 mg BID this afternoon. Will continue Diamox, Spironolactone 25 mg daily, and Lisinopril 10 mg daily. Coreg was held this morning since pt's HR was noted to be low. Will continue to monitor volume status with I's and O's and daily weights.   3. Paroxysmal A fib-Patient has remained in sinus rhythm. His HR was noted to be bradycardic this morning in the 50's. His Coreg was held this morning. Will continue to monitor telemetry. Will continue Xarelto.   4. Hypokalemia- Patient's K noted to be low at 3.0 today. He is receiving KCl replacement. Cr stable at 1.12. Will continue to monitor BMP.  6. HTN- Stable. Coreg being held due to low HR this morning. Will continue home Lisinopril 10 mg.   7. HLD- Stable. Continue Rosuvastatin.   Cultures:  none  Telemetry: Patient is on telemetry   DVT prophylaxis: Xarelto   Consultants: Cardiology, OT  Procedures:  none   Antimicrobials:  Code Status: FULL   Family Communication: none   Disposition Plan: Anticipate discharge tomorrow with clinical improvement    Objective: Vitals:   11/25/18 0507 11/25/18 0838 11/25/18 0900 11/25/18 0947  BP: 115/62   (!) 94/55  Pulse: 64   (!) 58  Resp: 16     Temp: 97.7 F (36.5 C)   97.6 F (36.4 C)  TempSrc: Oral  SpO2: 92% 95%  99%  Weight:   (P) 121.1 kg   Height:        Intake/Output Summary (Last 24 hours) at 11/25/2018 0948 Last data filed at 11/25/2018 0209 Gross per 24 hour  Intake -  Output 2800 ml  Net -2800 ml   Filed Weights   11/25/18 0209 11/25/18 0217 11/25/18 0900  Weight: 123.6 kg 132.6 kg (P) 121.1 kg    Exam:  Constitutional:normal appearing male Eyes: EOMI, anicteric, normal conjunctivae ENMT: Oropharynx with moist mucous membranes, normal dentition Neck: FROM Cardiovascular: RRR no MRGs, with trace BLE edema, DP and  PT pulses are intact  Respiratory: scattered Rhonchi Abdomen: Soft,non-tender, with no HSM Skin: No rash ulcers, or lesions. Without skin tenting  Neurologic: Grossly no focal neuro deficit. Psychiatric:Appropriate affect, and mood. Mental status AAOx3  Data Reviewed: CBC: Recent Labs  Lab 11/21/18 2238 11/22/18 0448 11/23/18 0506 11/24/18 0440  WBC 6.9 5.8 5.9 9.8  NEUTROABS 5.0  --   --   --   HGB 8.6* 8.8* 8.8* 9.4*  HCT 31.7* 30.8* 30.4* 32.1*  MCV 93.8 92.8 86.6 85.8  PLT 376 399 400 073*   Basic Metabolic Panel: Recent Labs  Lab 11/21/18 2238 11/22/18 0448 11/23/18 0506 11/24/18 0440 11/25/18 0408  NA 138 143 140 141 139  K 4.1 4.1 3.1* 3.2* 3.0*  CL 88* 90* 87* 85* 90*  CO2 41* 42* 40* 43* 40*  GLUCOSE 131* 130* 285* 211* 162*  BUN _0 25* 26*  CREATININE 0.91 0.89 1.08 1.00 1.12  CALCIUM 8.0* 8.3* 8.3* 8.4* 8.3*  MG  --   --  1.8 1.9  --   PHOS  --   --  3.0  --   --    GFR: Estimated Creatinine Clearance: 86.5 mL/min (by C-G formula based on SCr of 1.12 mg/dL). Liver Function Tests: Recent Labs  Lab 11/23/18 0506  AST 31  ALT 19  ALKPHOS 89  BILITOT 0.8  PROT 6.6  ALBUMIN 3.0*   No results for input(s): LIPASE, AMYLASE in the last 168 hours. No results for input(s): AMMONIA in the last 168 hours. Coagulation Profile: No results for input(s): INR, PROTIME in the last 168 hours. Cardiac Enzymes: No results for input(s): CKTOTAL, CKMB, CKMBINDEX, TROPONINI in the last 168 hours. BNP (last 3 results) No results for input(s): PROBNP in the last 8760 hours. HbA1C: No results for input(s): HGBA1C in the last 72 hours. CBG: Recent Labs  Lab 11/24/18 0804 11/24/18 1205 11/24/18 1709 11/24/18 2147 11/25/18 0857  GLUCAP 241* 251* 196* 230* 237*   Lipid Profile: No results for input(s): CHOL, HDL, LDLCALC, TRIG, CHOLHDL, LDLDIRECT in the last 72 hours. Thyroid Function Tests: No results for input(s): TSH, T4TOTAL, FREET4, T3FREE,  THYROIDAB in the last 72 hours. Anemia Panel: No results for input(s): VITAMINB12, FOLATE, FERRITIN, TIBC, IRON, RETICCTPCT in the last 72 hours. Urine analysis:    Component Value Date/Time   COLORURINE YELLOW 12/13/2016 0420   APPEARANCEUR CLEAR 12/13/2016 0420   LABSPEC 1.010 12/13/2016 0420   PHURINE 5.0 12/13/2016 0420   GLUCOSEU >=500 (A) 12/13/2016 0420   HGBUR NEGATIVE 12/13/2016 0420   BILIRUBINUR NEGATIVE 12/13/2016 0420   KETONESUR NEGATIVE 12/13/2016 0420   PROTEINUR 100 (A) 12/13/2016 0420   UROBILINOGEN 0.2 10/26/2014 2206   NITRITE NEGATIVE 12/13/2016 0420   LEUKOCYTESUR NEGATIVE 12/13/2016 0420   Sepsis Labs: _1 (procalcitonin:4,lacticidven:4)  )No results found for this or any previous visit (from the past 240  hour(s)).    Studies: No results found.  Scheduled Meds: . acetaZOLAMIDE  250 mg Oral BID  . budesonide (PULMICORT) nebulizer solution  0.25 mg Nebulization BID  . carvedilol  6.25 mg Oral BID WC  . guaiFENesin  600 mg Oral BID  . insulin aspart  0-5 Units Subcutaneous QHS  . insulin aspart  0-9 Units Subcutaneous TID WC  . ipratropium-albuterol  3 mL Nebulization Q6H  . latanoprost  1 drop Both Eyes QHS  . lisinopril  10 mg Oral Daily  . methylPREDNISolone (SOLU-MEDROL) injection  40 mg Intravenous BID  . potassium chloride  40 mEq Oral TID  . potassium chloride  60 mEq Oral Once  . rivaroxaban  20 mg Oral q1800  . rosuvastatin  20 mg Oral Daily  . sodium chloride flush  3 mL Intravenous Q12H  . spironolactone  25 mg Oral QHS  . torsemide  80 mg Oral BID    Continuous Infusions: . sodium chloride    . furosemide 120 mg (11/25/18 0948)     LOS: 3 days    Romie Minus, PA-S

## 2018-11-25 NOTE — Progress Notes (Signed)
No BIPAP needed at this time.  Will cont to monitor progress.

## 2018-11-25 NOTE — Care Management Important Message (Addendum)
Important Message  Patient Details  Name: Wendy Mikles MRN: [id number] Date of Birth: 1946/04/10   Medicare Important Message Given:  Yes Patient refused to sign.     Chanya Chrisley 11/25/2018, 3:06 PM

## 2018-11-26 ENCOUNTER — Inpatient Hospital Stay: Payer: Medicare Other | Admitting: Nurse Practitioner

## 2018-11-26 LAB — BASIC METABOLIC PANEL
ANION GAP: 8 (ref 5–15)
BUN: 33 mg/dL — ABNORMAL HIGH (ref 8–23)
CO2: 34 mmol/L — ABNORMAL HIGH (ref 22–32)
Calcium: 8.1 mg/dL — ABNORMAL LOW (ref 8.9–10.3)
Chloride: 92 mmol/L — ABNORMAL LOW (ref 98–111)
Creatinine, Ser: 1.06 mg/dL (ref 0.61–1.24)
GFR calc Af Amer: >60 mL/min (ref >60)
GFR calc non Af Amer: >60 mL/min (ref >60)
Glucose, Bld: 254 mg/dL — ABNORMAL HIGH (ref 70–99)
Potassium: 4.3 mmol/L (ref 3.5–5.1)
SODIUM: 134 mmol/L — AB (ref 135–145)

## 2018-11-26 LAB — MAGNESIUM: MAGNESIUM: 2.3 mg/dL (ref 1.7–2.4)

## 2018-11-26 LAB — GLUCOSE, CAPILLARY: Glucose-Capillary: 224 mg/dL — ABNORMAL HIGH (ref 70–99)

## 2018-11-26 MED ORDER — SORBITOL 70 % SOLN
30.0000 mL | Freq: Once | Status: AC
  Administered 2018-11-26: 30 mL via ORAL
  Filled 2018-11-26: qty 30

## 2018-11-26 MED ORDER — PREDNISONE 50 MG PO TABS: 50.0000 mg | ORAL_TABLET | Freq: Every day | ORAL | 0 refills | Status: AC

## 2018-11-26 MED ORDER — BISOPROLOL FUMARATE 5 MG PO TABS: 2.5000 mg | ORAL_TABLET | Freq: Every day | ORAL | Status: DC

## 2018-11-26 MED ORDER — LISINOPRIL 10 MG PO TABS: 10.0000 mg | ORAL_TABLET | Freq: Every day | ORAL | 0 refills | Status: DC

## 2018-11-26 MED ORDER — BISOPROLOL FUMARATE 5 MG PO TABS: 2.5000 mg | ORAL_TABLET | Freq: Every day | ORAL | 0 refills | Status: DC

## 2018-11-26 MED ORDER — POTASSIUM CHLORIDE CRYS ER 20 MEQ PO TBCR: EXTENDED_RELEASE_TABLET | ORAL | 0 refills | Status: DC

## 2018-11-26 MED ORDER — TORSEMIDE 20 MG PO TABS: 80.0000 mg | ORAL_TABLET | Freq: Two times a day (BID) | ORAL | 0 refills | Status: DC

## 2018-11-26 MED FILL — POTASSIUM CL ER 20 MEQ TABL: 20 | 30 days supply | Qty: 90 | Fill #0

## 2018-11-26 MED FILL — TORSEMIDE 20 MG TABLET: 20 | 30 days supply | Qty: 240 | Fill #0

## 2018-11-26 MED FILL — LISINOPRIL 10 MG TABS: 10 | 30 days supply | Qty: 30 | Fill #0

## 2018-11-26 MED FILL — BISOPROLOL FUMARATE 5 MG TA: 5 | 30 days supply | Qty: 15 | Fill #0

## 2018-11-26 MED FILL — predniSONE 20 MG TABS: 20 | 4 days supply | Qty: 10 | Fill #0

## 2018-11-26 NOTE — Progress Notes (Signed)
Occupational Therapy Treatment Patient Details Name: Nicholas Caldwell MRN: [id number] DOB: [birth year] Today's Date: 11/26/2018    History of present illness Pt is a 73 y.o. male w/ a hx of medical noncompliance, A flutter, CAD, chronic systolic CHF, and COPD on 3 L O2 at home who was discharged the week prior for the same issue and returned to the ED w/ shortness of breath. His wife reported the swelling in his legs had gotten markedly worse since he was discharged.    OT comments  Pt making good progress with functional goals and ready to d/c home this afternoon.   Follow Up Recommendations  Home health OT    Equipment Recommendations  None recommended by OT    Recommendations for Other Services      Precautions / Restrictions Precautions Precautions: Fall Precaution Comments: monitor SPO2 Restrictions Weight Bearing Restrictions: No       Mobility Bed Mobility               General bed mobility comments: pt up in recliner upon arrival  Transfers Overall transfer level: Needs assistance Equipment used: Rolling walker (2 wheeled) Transfers: Sit to/from [employer] Transfers Sit to Stand: Supervision Stand pivot transfers: Supervision            Balance Overall balance assessment: Needs assistance Sitting-balance support: No upper extremity supported;Feet supported Sitting balance-Leahy Scale: Good     Standing balance support: During functional activity;Bilateral upper extremity supported Standing balance-Leahy Scale: Poor                             ADL either performed or assessed with clinical judgement   ADL Overall ADL's : Needs assistance/impaired     Grooming: Supervision/safety;Standing                   Toilet Transfer: RW;Ambulation;Supervision/safety;Grab bars;Comfort height toilet   Toileting- Clothing Manipulation and Hygiene: Supervision/safety;Sit to/from stand       Functional mobility during ADLs: Rolling  walker;Supervision/safety General ADL Comments: reviewed energy conservation techniques with pt     Vision Patient Visual Report: No change from baseline     Perception     Praxis      Cognition Arousal/Alertness: Awake/alert Behavior During Therapy: Flat affect Overall Cognitive Status: Within Functional Limits for tasks assessed                                          Exercises     Shoulder Instructions       General Comments      Pertinent Vitals/ Pain       Pain Assessment: 0-10 Pain Score: 3  Pain Location: R hip Pain Descriptors / Indicators: Discomfort;Grimacing;Constant Pain Intervention(s): Monitored during session;Repositioned  Home Living                                          Prior Functioning/Environment              Frequency           Progress Toward Goals  OT Goals(current goals can now be found in the care plan section)  Progress towards OT goals: Progressing toward goals     Plan Discharge plan remains appropriate  Co-evaluation                 AM-PAC OT "6 Clicks" Daily Activity     Outcome Measure   Help from another person eating meals?: None Help from another person taking care of personal grooming?: None Help from another person toileting, which includes using toliet, bedpan, or urinal?: A Little Help from another person bathing (including washing, rinsing, drying)?: A Lot Help from another person to put on and taking off regular upper body clothing?: A Little Help from another person to put on and taking off regular lower body clothing?: A Lot 6 Click Score: 18    End of Session Equipment Utilized During Treatment: Oxygen;Gait belt;Rolling walker  OT Visit Diagnosis: Unsteadiness on feet (R26.81);Other abnormalities of gait and mobility (R26.89);Muscle weakness (generalized) (M62.81);Pain Pain - Right/Left: Right Pain - part of body: Hip   Activity Tolerance Patient  tolerated treatment well   Patient Left in chair;with call bell/phone within reach   Nurse Communication          Time: 4373-5789 OT Time Calculation (min): 15 min  Charges: OT General Charges $OT Visit: 1 Visit OT Treatments $Self Care/Home Management : 8-22 mins     Britt Bottom 11/26/2018, 1:03 PM

## 2018-11-26 NOTE — Progress Notes (Signed)
Patient had 26 beat run of wide QRS. Patient is asymptomatic.  Notified Lamar Blinks, NP. Will continue to monitor the patient and notify as needed.

## 2018-11-26 NOTE — Progress Notes (Signed)
Discharge instructions given to patient and family at bedside. Patient dressed and cleaned up by NT. Called volunteer services for wheelchair and cart to take patient and belongings to car. IVs taken out earlier today.

## 2018-11-26 NOTE — Progress Notes (Addendum)
Advanced Heart Failure Rounding Note  PCP-Cardiologist: No primary care provider on file.   Subjective:    Coreg was held yesterday. HR 50-60s throughout the day. Had NSVT overnight, longest 26 beats.  Received 120 mg IV lasix x1 + torsemide 80 mg yesterday. I/O -2 L. BMET and mag pending. Weight is 270 lbs on my check. Up 3 lbs from yesterday.  Denies CP or SOB. Good appetite. No BM since Sunday.  Objective:   Weight Range: 56.2 kg Body mass index is 16.8 kg/m.   Vital Signs:   Temp:  [97.6 F (36.4 C)-98.4 F (36.9 C)] 98.2 F (36.8 C) (01/24 0630) Pulse Rate:  [55-68] 68 (01/24 0630) Resp:  [18] 18 (01/24 0630) BP: (94-124)/(47-59) 124/51 (01/24 0630) SpO2:  [94 %-99 %] 96 % (01/24 0630) Weight:  [56.2 kg-121.1 kg] 56.2 kg (01/24 0630) Last BM Date: 11/21/18  Weight change: Filed Weights   11/25/18 0217 11/25/18 0900 11/26/18 0630  Weight: 132.6 kg 121.1 kg 56.2 kg    Intake/Output:   Intake/Output Summary (Last 24 hours) at 11/26/2018 0803 Last data filed at 11/26/2018 8144 Gross per 24 hour  Intake 542.02 ml  Output 2600 ml  Net -2057.98 ml      Physical Exam    General:Sitting up in chair.  No resp difficulty. HEENT: Normal Neck: Supple. JVP 5-6. Carotids 2+ bilat; no bruits. No thyromegaly or nodule noted. Cor: PMI nondisplaced. RRR, No M/G/R noted Lungs: [place] throughout. On 3L [place].  Abdomen: Soft, non-tender, non-distended, no HSM. No bruits or masses. +BS  Extremities: No cyanosis, clubbing, or rash. R and LLE no edema.  Neuro: Alert & orientedx3, cranial nerves grossly intact. moves all 4 extremities w/o difficulty. Affect pleasant  Telemetry   NSR 60-70s. Personally reviewed.   EKG    No new tracings.   Labs    CBC Recent Labs    11/24/18 0440  WBC 9.8  HGB 9.4*  HCT 32.1*  MCV 85.8  PLT 818*   Basic Metabolic Panel Recent Labs    11/24/18 0440 11/25/18 0408  NA 141 139  K 3.2* 3.0*  CL 85* 90*  CO2 43* 40*    GLUCOSE 211* 162*  BUN 25* 26*  CREATININE 1.00 1.12  CALCIUM 8.4* 8.3*  MG 1.9  --    Liver Function Tests No results for input(s): AST, ALT, ALKPHOS, BILITOT, PROT, ALBUMIN in the last 72 hours. No results for input(s): LIPASE, AMYLASE in the last 72 hours. Cardiac Enzymes No results for input(s): CKTOTAL, CKMB, CKMBINDEX, TROPONINI in the last 72 hours.  BNP: BNP (last 3 results) Recent Labs    11/15/18 0122 11/16/18 0237 11/21/18 2238  BNP 1,279.5* 606.1* 1,739.7*    ProBNP (last 3 results) No results for input(s): PROBNP in the last 8760 hours.   D-Dimer No results for input(s): DDIMER in the last 72 hours. Hemoglobin A1C No results for input(s): HGBA1C in the last 72 hours. Fasting Lipid Panel No results for input(s): CHOL, HDL, LDLCALC, TRIG, CHOLHDL, LDLDIRECT in the last 72 hours. Thyroid Function Tests No results for input(s): TSH, T4TOTAL, T3FREE, THYROIDAB in the last 72 hours.  Invalid input(s): FREET3  Other results:   Imaging    No results found.   Medications:     Scheduled Medications: . budesonide (PULMICORT) nebulizer solution  0.25 mg Nebulization BID  . carvedilol  6.25 mg Oral BID WC  . guaiFENesin  600 mg Oral BID  . insulin aspart  0-5  Units Subcutaneous QHS  . insulin aspart  0-9 Units Subcutaneous TID WC  . ipratropium-albuterol  3 mL Nebulization Q6H  . latanoprost  1 drop Both Eyes QHS  . lisinopril  10 mg Oral Daily  . methylPREDNISolone (SOLU-MEDROL) injection  40 mg Intravenous BID  . potassium chloride  40 mEq Oral TID  . rivaroxaban  20 mg Oral q1800  . rosuvastatin  20 mg Oral Daily  . sodium chloride flush  3 mL Intravenous Q12H  . spironolactone  25 mg Oral QHS  . torsemide  80 mg Oral BID    Infusions: . sodium chloride      PRN Medications: sodium chloride, albuterol, diazepam, ondansetron **OR** ondansetron (ZOFRAN) IV, oxyCODONE-acetaminophen **AND** oxyCODONE, sodium chloride flush    Patient  Profile   Josephus Harriger a 73 y.o.malePMH of chronic systolic CHF/nonischemic cardiomyopathy Echo 12/13/2016 LVEF 40-45%, COPD on home oxygen, PAF s/p DCCV 2018, and history of non compliance.   Admitted with BLE edema and wheezing. Admitted for COPD exacerbation and volume overload.   Assessment/Plan   1. Acute on chronic systolic CHF  - Echo 4/97/02 40-45%, Grade 2 DD, Mod LAE, Mild RV, Mod RAE, PA peak pressure 56 mm Hg. - Echo 04/07/18: EF 40-45%, PA peak pressure 62 mmHg - Volume status stable on exam. Down 14 lbs and now below previous dry weight of 274 lbs (he is 270 lbs today). - Continue torsemide 80 mg BID -Continuespiro 25 mg daily.  - Given COPD, will replace Coreg with bisoprolol 2.5 mg daily. HR 60s generally while awake. He had a few episodes of bradycardia while sleeping. I added hold parameters to only hold if HR <50. -Continue lisinopril 10 mg daily. EF too high for Entresto.  2. Acute on chronic hypoxic Respiratory failure/COPD/?OSA - PFTs with restrictive lung disease.  - Follows with Dr Melvyn Novas outpatient.  - Back on home O2 3L - He has a sleep study on 1/28. Discussed importance of going to this.  - Continues to smoke. Discussed cessation.  - Femains on solu-medrol and duonebs. Per primary team.  3. Paroxysmal Afib - Maintaining NSR. Has required DCCV in past.  - Continue Xarelto 20 mg daily. Denies bleeding.  - CHA2DS2/VASc is 5.  - Has previously failed amiodarone and poor tikosyn candidate with long QT. Now following with EP for possible long QT syndrome.  - Poor ablation candidate with LA size 4. Tobacco abuse  - Continues to smoke. Down to 5 cigarettes/day. Discussed cessation. No change.  5. Mild nonobstructive CAD on LHC 2016 - Continue crestor 20 mg daily  - Continue Xarelto. No ASA - No s/s ischemia.  6. Heart block/bradycardia - Bradycardia overnight while sleeping. I think this is related to OSA. He is getting a sleep study next week. Does not  happen when he is awake.  7. Hypokalemia - BMET pending.  8. NSVT - K and Mag pending.  - Coreg held yesterday with HR in 50-60s. Needs to get his beta blocker today. I added parameters to only hold if HR <50.  9. Constipation - Add sorbitol 10. Deconditioning - PT and OT recommending HH PT and OT. Per primary.   Medication concerns reviewed with patient and pharmacy team. Barriers identified: compliance   I think he is stable for home from HF standpoint. Will await BMET results. He has f/u in HF clinic next week.  Length of Stay: [place], NP  11/24/2018, 9:25 AM  Advanced Heart Failure Team Pager  [phone] (M-F; 7a - 4p)  Please contact [facility] for night-coverage after hours (4p -7a ) and weekends on [email or web address]  Patient seen with NP, agree with the above note.   He is stable today, wheezing appears to have resolved.  On exam, he does not appear volume overloaded and has diuresed well.  HR in 40s-50s overnight, but suspect vagal tone/sleep apnea.  HR in 60s during the day so would NOT hold his beta blocker.  Had a run of NSVT.   Given severe COPD, would transition him to more beta-1 selective beta blocker when he goes home.  Stop Coreg and start bisoprolol 2.5 mg daily.   From cardiac standpoint, think he is ready for discharge.   Cardiac meds for home:  Bisoprolol 2.5 mg daily Torsemide 80 mg bid Spironolactone 25 daily Xarelto 20 daily Lisinopril 10 daily Crestor 20 daily KCl 40 qam/20 qpm  We will arrange CHF clinic followup.   Loralie Champagne 11/26/2018 9:21 AM

## 2018-11-26 NOTE — Discharge Summary (Signed)
Physician Discharge Summary  Nicholas Caldwell RXV:[id number] DOB: 02-Feb-1946 DOA: 11/21/2018  PCP: [facility]  Admit date: 11/21/2018 Discharge date: 11/26/2018  Admitted From: Home Disposition: Home  Recommendations for Outpatient Follow-up:  1. Follow up with PCP in 1-2 weeks 2. Please obtain BMP/CBC in one week your next doctors visit.  3. Cardiac medications as follows- Crestor 20 mg daily, lisinopril 10 mg daily, Xarelto 20 mg daily, torsemide 80 mg twice daily, spironolactone 25 mg daily, bisoprolol 2.5 mg daily.  Potassium supplements-40 mill equivalent in the morning and 20 mEq at night 4. Follow-up outpatient with cardiology   Discharge Condition: Stable CODE STATUS: Full code Diet recommendation: Cardiac  Brief/Interim Summary: 73 year old male with history of COPD on 3 L nasal cannula, systolic CHF ejection fraction 40-45%, paroxysmal atrial fibrillation, essential hypertension came to the hospital with hypoxic respiratory failure found to be in CHF exacerbation and also COPD exacerbation.  He was aggressively diuresed during the hospitalization and also started on IV steroids eventually with bronchodilators.  Heart failure team was consulted and medication adjustments were made as stated above. Today patient feels significantly better after aggressive diuresis and breathing treatments.  He will be discharged today with outpatient follow-up recommendations are stated above.   Discharge Diagnoses:  Principal Problem:   Acute on chronic respiratory failure with hypoxia and hypercapnia (HCC) Active Problems:   Essential hypertension   COPD exacerbation (HCC)   Chronic pain   PAF (paroxysmal atrial fibrillation) (HCC)   CAD - Non-obstructive by JKD3/26   Chronic systolic CHF (congestive heart failure) (HCC)   Acute on chronic combined systolic and diastolic CHF (congestive heart failure) (HCC)   OSA (obstructive sleep apnea)   Medically noncompliant   Chronic  respiratory failure with hypoxia (HCC)   Acute exacerbation of CHF (congestive heart failure) (HCC)  Acute hypoxic respiratory failure, multifactorial-COPD and CHF exacerbation - Patient is currently saturating well above 90% on 3 L nasal cannula.  He is on 3 L nasal cannula at baseline at home.  Each issues discussed separately below.  Acute on chronic diastolic congestive heart failure, ejection fraction 40-45%, class II -Patient has been aggressively diuresed during the hospitalization with IV Lasix and Diamox. -Heart failure team following- medication adjustments made with final recommendation of medications as listed above.  Outpatient follow-up appointment to be made by their service.  Acute mild exacerbation of mild intermittent COPD Acute on chronic hypoxic respiratory failure -Continue home 3 L nasal cannula.  Bronchodilators - Prednisone oral for 4 more days.  Essential hypertension - Continue home medication lisinopril, Coreg has been changed to bisoprolol  Paroxysmal atrial fibrillation -Currently normal sinus rhythm.  Plan on switching Coreg to bisoprolol per cardiology.  Continue Xarelto.  Hyperlipidemia -Continue statin  On Xarelto for DVT prophylaxis Discharged in stable condition Wife at bedside   Discharge Instructions   Allergies as of 11/26/2018   No Known Allergies     Medication List    STOP taking these medications   carvedilol 6.25 MG tablet Commonly known as:  COREG     TAKE these medications   albuterol 108 (90 Base) MCG/ACT inhaler Commonly known as:  PROVENTIL HFA;VENTOLIN HFA Inhale 2 puffs into the lungs every 6 (six) hours as needed for wheezing or shortness of breath.   bisoprolol 5 MG tablet Commonly known as:  ZEBETA Take 0.5 tablets (2.5 mg total) by mouth daily for 30 days.   budesonide-formoterol 160-4.5 MCG/ACT inhaler Commonly known as:  SYMBICORT Inhale 2 puffs into  the lungs 2 (two) times daily.   diazepam 10 MG  tablet Commonly known as:  VALIUM Take 0.5 tablets (5 mg total) by mouth daily as needed for anxiety or sleep. What changed:  how much to take   ferrous sulfate 325 (65 FE) MG tablet Take 325 mg by mouth 3 (three) times daily as needed (for supplementation).   fluticasone 50 MCG/ACT nasal spray Commonly known as:  FLONASE Place 2 sprays into both nostrils daily as needed for allergies.   folic acid 1 MG tablet Commonly known as:  FOLVITE Take 1 mg by mouth daily.   gabapentin 600 MG tablet Commonly known as:  NEURONTIN Take 1 tablet (600 mg total) by mouth 2 (two) times daily.   glucose 4 GM chewable tablet Chew 4 tablets by mouth See admin instructions. Chew 4 tablets by mouth as needed for low blood sugar - repeat every 15 minutes if blood sugar less than 70   guaifenesin 400 MG Tabs tablet Commonly known as:  HUMIBID E Take 1 tablet (400 mg total) by mouth every 6 (six) hours as needed. What changed:  reasons to take this   hydrocortisone 1 % lotion Apply 1 application topically See admin instructions. Apply small amount to back twice daily for itchy rash   hydrocortisone 2.5 % rectal cream Commonly known as:  ANUSOL-HC Place 1 application rectally 2 (two) times daily as needed for hemorrhoids or itching.   insulin aspart protamine - aspart (70-30) 100 UNIT/ML FlexPen Commonly known as:  NOVOLOG MIX 70/30 FLEXPEN Inject 0.05 mLs (5 Units total) into the skin 2 (two) times daily. What changed:  how much to take   ipratropium-albuterol 0.5-2.5 (3) MG/3ML Soln Commonly known as:  DUONEB Take 3 mLs by nebulization 2 (two) times daily.   latanoprost 0.005 % ophthalmic solution Commonly known as:  XALATAN Place 1 drop into both eyes at bedtime.   lisinopril 10 MG tablet Commonly known as:  PRINIVIL,ZESTRIL Take 1 tablet (10 mg total) by mouth daily for 30 days. Start taking on:  November 27, 2018 What changed:    medication strength  how much to take   Magnesium  Oxide 420 (252 Mg) MG Tabs Take 420 mg by mouth 2 (two) times daily.   metFORMIN 500 MG 24 hr tablet Commonly known as:  GLUCOPHAGE-XR Take 1,000 mg by mouth 2 (two) times daily with a meal.   oxyCODONE-acetaminophen 10-325 MG tablet Commonly known as:  PERCOCET Take 1 tablet by mouth every 6 (six) hours as needed for pain.   OXYGEN Inhale 3 L into the lungs continuous.   potassium chloride SA 20 MEQ tablet Commonly known as:  K-DUR,KLOR-CON Take 2 tablets (40 mEq total) by mouth every morning AND 1 tablet (20 mEq total) at bedtime. What changed:  See the new instructions.   predniSONE 50 MG tablet Commonly known as:  DELTASONE Take 1 tablet (50 mg total) by mouth daily with breakfast for 4 days.   rivaroxaban 20 MG Tabs tablet Commonly known as:  XARELTO Take 1 tablet (20 mg total) by mouth daily with supper. What changed:  when to take this   rosuvastatin 20 MG tablet Commonly known as:  CRESTOR Take 1 tablet (20 mg total) by mouth daily.   spironolactone 25 MG tablet Commonly known as:  ALDACTONE Take 1 tablet (25 mg total) by mouth at bedtime.   torsemide 20 MG tablet Commonly known as:  DEMADEX Take 4 tablets (80 mg total) by mouth 2 (  two) times daily for 30 days. What changed:  how much to take      Follow-up Information    [facility] Follow up.   Specialty:  Home Health Services Contact information: [place] [address] [phone]        Conrad Ironton, NP Follow up on 12/01/2018.   Specialty:  Cardiology Why:  12 pm. HF follow up. Garage code is [phone]. Please call HF clinic if you need transportation assistance. Take all medications and bring them with you.  Contact information: [address] [place] [address] [phone]        [facility] Schedule an appointment as soon as possible for a visit in 2 week(s).   Contact information: [place]  [address] [phone]          No Known Allergies  You were cared for by a hospitalist during your hospital stay. If you have any questions about your discharge medications or the care you received while you were in the hospital after you are discharged, you can call the unit and asked to speak with the hospitalist on call if the hospitalist that took care of you is not available. Once you are discharged, your primary care physician will handle any further medical issues. Please note that no refills for any discharge medications will be authorized once you are discharged, as it is imperative that you return to your primary care physician (or establish a relationship with a primary care physician if you do not have one) for your aftercare needs so that they can reassess your need for medications and monitor your lab values.  Consultations:  Cardiology    Procedures/Studies: Dg Chest Portable 1 View  Result Date: 11/21/2018 CLINICAL DATA:  sob, 89% on home 3L O2. Given 10 albuterol, 1 Atrovent and 125 solumedrol. Now at 100% on neb. Wheezing and rhonci in all lobes. sob EXAM: PORTABLE CHEST 1 VIEW COMPARISON:  11/15/2010 FINDINGS: Enlarged cardiac silhouette. LEFT basilar atelectasis and effusion. Fine airspace disease. No interval change. IMPRESSION: No interval change in congestive heart failure pattern. Electronically Signed   By: Suzy Bouchard M.D.   On: 11/21/2018 23:16   Dg Chest Port 1 View  Result Date: 11/15/2018 CLINICAL DATA:  Pulmonary edema. EXAM: PORTABLE CHEST 1 VIEW COMPARISON:  11/14/2018.  11/12/2018. FINDINGS: Cardiomegaly with persistent bilateral pulmonary infiltrates/edema. Left base atelectasis. Left-sided pleural effusion. No pneumothorax. IMPRESSION: Cardiomegaly with persistent bilateral pulmonary infiltrates/edema. Left base atelectasis. Left-sided pleural effusion. Similar findings noted on prior exams. Electronically Signed   By: Marcello Moores  Register   On: 11/15/2018  07:14   Dg Chest Port 1 View  Result Date: 11/14/2018 CLINICAL DATA:  Pulmonary edema EXAM: PORTABLE CHEST 1 VIEW COMPARISON:  Chest radiograph 11/12/2018 FINDINGS: Monitoring leads overlie the patient. Stable cardiomegaly. Diffuse bilateral interstitial pulmonary opacities with persistent consolidation within the left mid and lower lung. Small to moderate left pleural effusion. No pneumothorax. IMPRESSION: Persistent consolidation within the left mid and lower lung which may represent or focal edema underlying a left effusion or potentially pneumonia. Diffuse bilateral interstitial opacities favored to represent edema. Cardiomegaly. Electronically Signed   By: Lovey Newcomer M.D.   On: 11/14/2018 07:43   Dg Chest Port 1 View  Result Date: 11/12/2018 CLINICAL DATA:  Shortness of breath EXAM: PORTABLE CHEST 1 VIEW COMPARISON:  10/26/2018 FINDINGS: Cardiomegaly with vascular congestion and moderate pulmonary edema. There are small pleural effusions. Aortic atherosclerosis. No pneumothorax.  Persistent left lung base opacity. IMPRESSION: Cardiomegaly with vascular congestion and moderate diffuse interstitial opacities suspicious for pulmonary edema. More confluent hazy lung base opacities may reflect atelectasis or superimposed pneumonia. There are small pleural effusions. Electronically Signed   By: Donavan Foil M.D.   On: 11/12/2018 23:10      Subjective: Feeling much better, no complaints   Discharge Exam: Vitals:   11/26/18 0630 11/26/18 0830  BP: (!) 124/51   Pulse: 68   Resp: 18   Temp: 98.2 F (36.8 C)   SpO2: 96% 92%   Vitals:   11/25/18 2104 11/26/18 0350 11/26/18 0630 11/26/18 0830  BP: (!) 121/47  (!) 124/51   Pulse: (!) 57  68   Resp: 18  18   Temp: 98.2 F (36.8 C)  98.2 F (36.8 C)   TempSrc: Oral  Oral   SpO2: 97% 96% 96% 92%  Weight:   56.2 kg   Height:        General: Pt is alert, awake, not in acute distress, on 2 L nasal cannula Cardiovascular: RRR, S1/S2 +, no  rubs, no gallops Respiratory: Mild expiratory wheezing heard on the left side but improved from yesterday. Abdominal: Soft, NT, ND, bowel sounds + Extremities: 1+ bilateral lower extremity pitting edema, no cyanosis    The results of significant diagnostics from this hospitalization (including imaging, microbiology, ancillary and laboratory) are listed below for reference.     Microbiology: No results found for this or any previous visit (from the past 240 hour(s)).   Labs: BNP (last 3 results) Recent Labs    11/15/18 0122 11/16/18 0237 11/21/18 2238  BNP 1,279.5* 606.1* 1,962.2*   Basic Metabolic Panel: Recent Labs  Lab 11/22/18 0448 11/23/18 0506 11/24/18 0440 11/25/18 0408 11/26/18 0839  NA 143 140 141 139 134*  K 4.1 3.1* 3.2* 3.0* 4.3  CL 90* 87* 85* 90* 92*  CO2 42* 40* 43* 40* 34*  GLUCOSE 130* 285* 211* 162* 254*  BUN 14 22 25* 26* 33*  CREATININE 0.89 1.08 1.00 1.12 1.06  CALCIUM 8.3* 8.3* 8.4* 8.3* 8.1*  MG  --  1.8 1.9  --  2.3  PHOS  --  3.0  --   --   --    Liver Function Tests: Recent Labs  Lab 11/23/18 0506  AST 31  ALT 19  ALKPHOS 89  BILITOT 0.8  PROT 6.6  ALBUMIN 3.0*   No results for input(s): LIPASE, AMYLASE in the last 168 hours. No results for input(s): AMMONIA in the last 168 hours. CBC: Recent Labs  Lab 11/21/18 2238 11/22/18 0448 11/23/18 0506 11/24/18 0440  WBC 6.9 5.8 5.9 9.8  NEUTROABS 5.0  --   --   --   HGB 8.6* 8.8* 8.8* 9.4*  HCT 31.7* 30.8* 30.4* 32.1*  MCV 93.8 92.8 86.6 85.8  PLT 376 399 400 428*   Cardiac Enzymes: No results for input(s): CKTOTAL, CKMB, CKMBINDEX, TROPONINI in the last 168 hours. BNP: Invalid input(s): POCBNP CBG: Recent Labs  Lab 11/25/18 0857 11/25/18 1210 11/25/18 1618 11/25/18 2135 11/26/18 0824  GLUCAP 237* 202* 246* 240* 224*   D-Dimer No results for input(s): DDIMER in the last 72 hours. Hgb A1c No results for input(s): HGBA1C in the last 72 hours. Lipid Profile No  results for input(s): CHOL, HDL, LDLCALC, TRIG, CHOLHDL, LDLDIRECT in the last 72 hours. Thyroid function studies No results for input(s): TSH, T4TOTAL, T3FREE, THYROIDAB in the last 72 hours.  Invalid input(s): FREET3  Anemia work up No results for input(s): VITAMINB12, FOLATE, FERRITIN, TIBC, IRON, RETICCTPCT in the last 72 hours. Urinalysis    Component Value Date/Time   COLORURINE YELLOW 12/13/2016 0420   APPEARANCEUR CLEAR 12/13/2016 0420   LABSPEC 1.010 12/13/2016 0420   PHURINE 5.0 12/13/2016 0420   GLUCOSEU >=500 (A) 12/13/2016 0420   HGBUR NEGATIVE 12/13/2016 0420   BILIRUBINUR NEGATIVE 12/13/2016 0420   KETONESUR NEGATIVE 12/13/2016 0420   PROTEINUR 100 (A) 12/13/2016 0420   UROBILINOGEN 0.2 10/26/2014 2206   NITRITE NEGATIVE 12/13/2016 0420   LEUKOCYTESUR NEGATIVE 12/13/2016 0420   Sepsis Labs Invalid input(s): PROCALCITONIN,  WBC,  LACTICIDVEN Microbiology No results found for this or any previous visit (from the past 240 hour(s)).   Time coordinating discharge:  I have spent 35 minutes face to face with the patient and on the ward discussing the patients care, assessment, plan and disposition with other care givers. >50% of the time was devoted counseling the patient about the risks and benefits of treatment/Discharge disposition and coordinating care.   SIGNED:   Damita Lack, MD  [facility] 11/26/2018, 11:10 AM   If 7PM-7AM, please contact night-coverage [email or web address]

## 2018-11-30 ENCOUNTER — Inpatient Hospital Stay: Payer: Medicare Other | Admitting: Primary Care

## 2018-11-30 ENCOUNTER — Telehealth: Payer: Self-pay | Admitting: Internal Medicine

## 2018-11-30 ENCOUNTER — Inpatient Hospital Stay (HOSPITAL_COMMUNITY)
Admission: EM | Admit: 2018-11-30 | Discharge: 2018-12-12 | DRG: 286 | Disposition: A | Discharge status: Home Health | Payer: Medicare Other | Attending: Internal Medicine | Admitting: Internal Medicine

## 2018-11-30 DIAGNOSIS — E875 Hyperkalemia: Secondary | ICD-10-CM | POA: Diagnosis not present

## 2018-11-30 DIAGNOSIS — N170 Acute kidney failure with tubular necrosis: Secondary | ICD-10-CM | POA: Diagnosis present

## 2018-11-30 DIAGNOSIS — I10 Essential (primary) hypertension: Secondary | ICD-10-CM | POA: Diagnosis present

## 2018-11-30 DIAGNOSIS — Z794 Long term (current) use of insulin: Secondary | ICD-10-CM | POA: Diagnosis not present

## 2018-11-30 DIAGNOSIS — R402352 Coma scale, best motor response, localizes pain, at arrival to emergency department: Secondary | ICD-10-CM | POA: Diagnosis present

## 2018-11-30 DIAGNOSIS — E1140 Type 2 diabetes mellitus with diabetic neuropathy, unspecified: Secondary | ICD-10-CM | POA: Diagnosis not present

## 2018-11-30 DIAGNOSIS — F17210 Nicotine dependence, cigarettes, uncomplicated: Secondary | ICD-10-CM | POA: Diagnosis present

## 2018-11-30 DIAGNOSIS — G92 Toxic encephalopathy: Secondary | ICD-10-CM | POA: Diagnosis present

## 2018-11-30 DIAGNOSIS — J441 Chronic obstructive pulmonary disease with (acute) exacerbation: Secondary | ICD-10-CM | POA: Diagnosis present

## 2018-11-30 DIAGNOSIS — J431 Panlobular emphysema: Secondary | ICD-10-CM | POA: Diagnosis present

## 2018-11-30 DIAGNOSIS — I2510 Atherosclerotic heart disease of native coronary artery without angina pectoris: Secondary | ICD-10-CM | POA: Diagnosis not present

## 2018-11-30 DIAGNOSIS — R9431 Abnormal electrocardiogram [ECG] [EKG]: Secondary | ICD-10-CM

## 2018-11-30 DIAGNOSIS — N183 Chronic kidney disease, stage 3 (moderate): Secondary | ICD-10-CM | POA: Diagnosis present

## 2018-11-30 DIAGNOSIS — J9602 Acute respiratory failure with hypercapnia: Secondary | ICD-10-CM

## 2018-11-30 DIAGNOSIS — E7800 Pure hypercholesterolemia, unspecified: Secondary | ICD-10-CM | POA: Diagnosis present

## 2018-11-30 DIAGNOSIS — J811 Chronic pulmonary edema: Secondary | ICD-10-CM | POA: Diagnosis not present

## 2018-11-30 DIAGNOSIS — E1169 Type 2 diabetes mellitus with other specified complication: Secondary | ICD-10-CM | POA: Diagnosis present

## 2018-11-30 DIAGNOSIS — I110 Hypertensive heart disease with heart failure: Secondary | ICD-10-CM | POA: Diagnosis not present

## 2018-11-30 DIAGNOSIS — G4733 Obstructive sleep apnea (adult) (pediatric): Secondary | ICD-10-CM | POA: Diagnosis present

## 2018-11-30 DIAGNOSIS — Z9081 Acquired absence of spleen: Secondary | ICD-10-CM

## 2018-11-30 DIAGNOSIS — T50915A Adverse effect of multiple unspecified drugs, medicaments and biological substances, initial encounter: Secondary | ICD-10-CM | POA: Diagnosis present

## 2018-11-30 DIAGNOSIS — J189 Pneumonia, unspecified organism: Secondary | ICD-10-CM

## 2018-11-30 DIAGNOSIS — I272 Pulmonary hypertension, unspecified: Secondary | ICD-10-CM | POA: Diagnosis present

## 2018-11-30 DIAGNOSIS — R092 Respiratory arrest: Secondary | ICD-10-CM

## 2018-11-30 DIAGNOSIS — R2689 Other abnormalities of gait and mobility: Secondary | ICD-10-CM | POA: Diagnosis not present

## 2018-11-30 DIAGNOSIS — I130 Hypertensive heart and chronic kidney disease with heart failure and stage 1 through stage 4 chronic kidney disease, or unspecified chronic kidney disease: Secondary | ICD-10-CM | POA: Diagnosis not present

## 2018-11-30 DIAGNOSIS — IMO0002 Reserved for concepts with insufficient information to code with codable children: Secondary | ICD-10-CM | POA: Diagnosis present

## 2018-11-30 DIAGNOSIS — E872 Acidosis: Secondary | ICD-10-CM | POA: Diagnosis present

## 2018-11-30 DIAGNOSIS — R570 Cardiogenic shock: Secondary | ICD-10-CM | POA: Diagnosis not present

## 2018-11-30 DIAGNOSIS — J9622 Acute and chronic respiratory failure with hypercapnia: Secondary | ICD-10-CM | POA: Diagnosis present

## 2018-11-30 DIAGNOSIS — Z9981 Dependence on supplemental oxygen: Secondary | ICD-10-CM

## 2018-11-30 DIAGNOSIS — G40909 Epilepsy, unspecified, not intractable, without status epilepticus: Secondary | ICD-10-CM | POA: Diagnosis present

## 2018-11-30 DIAGNOSIS — R531 Weakness: Secondary | ICD-10-CM | POA: Diagnosis not present

## 2018-11-30 DIAGNOSIS — I459 Conduction disorder, unspecified: Secondary | ICD-10-CM | POA: Diagnosis present

## 2018-11-30 DIAGNOSIS — E785 Hyperlipidemia, unspecified: Secondary | ICD-10-CM | POA: Diagnosis present

## 2018-11-30 DIAGNOSIS — J9621 Acute and chronic respiratory failure with hypoxia: Secondary | ICD-10-CM | POA: Diagnosis present

## 2018-11-30 DIAGNOSIS — R778 Other specified abnormalities of plasma proteins: Secondary | ICD-10-CM

## 2018-11-30 DIAGNOSIS — R7989 Other specified abnormal findings of blood chemistry: Secondary | ICD-10-CM

## 2018-11-30 DIAGNOSIS — I959 Hypotension, unspecified: Secondary | ICD-10-CM | POA: Diagnosis not present

## 2018-11-30 DIAGNOSIS — E1165 Type 2 diabetes mellitus with hyperglycemia: Secondary | ICD-10-CM | POA: Diagnosis present

## 2018-11-30 DIAGNOSIS — Z79899 Other long term (current) drug therapy: Secondary | ICD-10-CM

## 2018-11-30 DIAGNOSIS — R402232 Coma scale, best verbal response, inappropriate words, at arrival to emergency department: Secondary | ICD-10-CM | POA: Diagnosis present

## 2018-11-30 DIAGNOSIS — J810 Acute pulmonary edema: Secondary | ICD-10-CM | POA: Diagnosis not present

## 2018-11-30 DIAGNOSIS — Z6838 Body mass index (BMI) 38.0-38.9, adult: Secondary | ICD-10-CM

## 2018-11-30 DIAGNOSIS — Z716 Tobacco abuse counseling: Secondary | ICD-10-CM

## 2018-11-30 DIAGNOSIS — Z8249 Family history of ischemic heart disease and other diseases of the circulatory system: Secondary | ICD-10-CM

## 2018-11-30 DIAGNOSIS — Z6841 Body Mass Index (BMI) 40.0 and over, adult: Secondary | ICD-10-CM | POA: Diagnosis not present

## 2018-11-30 DIAGNOSIS — Z7901 Long term (current) use of anticoagulants: Secondary | ICD-10-CM

## 2018-11-30 DIAGNOSIS — R000 Tachycardia, unspecified: Secondary | ICD-10-CM | POA: Diagnosis not present

## 2018-11-30 DIAGNOSIS — I480 Paroxysmal atrial fibrillation: Secondary | ICD-10-CM | POA: Diagnosis not present

## 2018-11-30 DIAGNOSIS — E1122 Type 2 diabetes mellitus with diabetic chronic kidney disease: Secondary | ICD-10-CM | POA: Diagnosis present

## 2018-11-30 DIAGNOSIS — Y92009 Unspecified place in unspecified non-institutional (private) residence as the place of occurrence of the external cause: Secondary | ICD-10-CM

## 2018-11-30 DIAGNOSIS — I428 Other cardiomyopathies: Secondary | ICD-10-CM | POA: Diagnosis present

## 2018-11-30 DIAGNOSIS — J9611 Chronic respiratory failure with hypoxia: Secondary | ICD-10-CM | POA: Diagnosis present

## 2018-11-30 DIAGNOSIS — Z7951 Long term (current) use of inhaled steroids: Secondary | ICD-10-CM

## 2018-11-30 DIAGNOSIS — I472 Ventricular tachycardia: Secondary | ICD-10-CM | POA: Diagnosis present

## 2018-11-30 DIAGNOSIS — I248 Other forms of acute ischemic heart disease: Secondary | ICD-10-CM | POA: Diagnosis present

## 2018-11-30 DIAGNOSIS — D631 Anemia in chronic kidney disease: Secondary | ICD-10-CM | POA: Diagnosis present

## 2018-11-30 DIAGNOSIS — E118 Type 2 diabetes mellitus with unspecified complications: Secondary | ICD-10-CM

## 2018-11-30 DIAGNOSIS — N179 Acute kidney failure, unspecified: Secondary | ICD-10-CM | POA: Diagnosis not present

## 2018-11-30 DIAGNOSIS — Z4682 Encounter for fitting and adjustment of non-vascular catheter: Secondary | ICD-10-CM | POA: Diagnosis not present

## 2018-11-30 DIAGNOSIS — Z79891 Long term (current) use of opiate analgesic: Secondary | ICD-10-CM

## 2018-11-30 DIAGNOSIS — I5043 Acute on chronic combined systolic (congestive) and diastolic (congestive) heart failure: Secondary | ICD-10-CM | POA: Diagnosis present

## 2018-11-30 DIAGNOSIS — J9690 Respiratory failure, unspecified, unspecified whether with hypoxia or hypercapnia: Secondary | ICD-10-CM

## 2018-11-30 DIAGNOSIS — E1142 Type 2 diabetes mellitus with diabetic polyneuropathy: Secondary | ICD-10-CM | POA: Diagnosis present

## 2018-11-30 DIAGNOSIS — I5023 Acute on chronic systolic (congestive) heart failure: Secondary | ICD-10-CM | POA: Diagnosis not present

## 2018-11-30 DIAGNOSIS — I4892 Unspecified atrial flutter: Secondary | ICD-10-CM | POA: Diagnosis present

## 2018-11-30 DIAGNOSIS — R195 Other fecal abnormalities: Secondary | ICD-10-CM | POA: Diagnosis not present

## 2018-11-30 DIAGNOSIS — I429 Cardiomyopathy, unspecified: Secondary | ICD-10-CM | POA: Diagnosis not present

## 2018-11-30 DIAGNOSIS — E876 Hypokalemia: Secondary | ICD-10-CM | POA: Diagnosis not present

## 2018-11-30 DIAGNOSIS — G8929 Other chronic pain: Secondary | ICD-10-CM | POA: Diagnosis present

## 2018-11-30 DIAGNOSIS — R402122 Coma scale, eyes open, to pain, at arrival to emergency department: Secondary | ICD-10-CM | POA: Diagnosis present

## 2018-11-30 DIAGNOSIS — J9601 Acute respiratory failure with hypoxia: Secondary | ICD-10-CM | POA: Diagnosis present

## 2018-11-30 DIAGNOSIS — I088 Other rheumatic multiple valve diseases: Secondary | ICD-10-CM | POA: Diagnosis present

## 2018-11-30 NOTE — Telephone Encounter (Signed)
Called by EMS re: pt who had called them for low BP.  Called to see if SBP of 80s was related to recent medication changes.  Advised EMS to bring pt to ED for evaluation of hypotension.  Magda Paganini, MD Cardiology fellow

## 2018-11-30 NOTE — ED Triage Notes (Signed)
Pt coming from home by EMS since developing hypotension and weakness. New onset of right arm weakness and tremors but no drift with gravity. Negative stroke screen per EMS. Hx of COPD and new CHF clinic pt. BP on scene was 106/50 and stayed around 80s/50s in route. Recent hospital stay at [facility] where pt had BP meds changed. Wheezes and rales upon assessment but EMS states that is pt's baseline. 3 L [place] at home.

## 2018-12-01 ENCOUNTER — Inpatient Hospital Stay (HOSPITAL_COMMUNITY): Payer: Medicare Other

## 2018-12-01 ENCOUNTER — Emergency Department (HOSPITAL_COMMUNITY): Payer: Medicare Other

## 2018-12-01 ENCOUNTER — Encounter (HOSPITAL_COMMUNITY): Payer: Medicare Other

## 2018-12-01 ENCOUNTER — Encounter (HOSPITAL_COMMUNITY): Payer: Self-pay | Admitting: Emergency Medicine

## 2018-12-01 ENCOUNTER — Other Ambulatory Visit: Payer: Self-pay

## 2018-12-01 DIAGNOSIS — R0602 Shortness of breath: Secondary | ICD-10-CM | POA: Diagnosis not present

## 2018-12-01 DIAGNOSIS — I10 Essential (primary) hypertension: Secondary | ICD-10-CM | POA: Diagnosis not present

## 2018-12-01 DIAGNOSIS — G92 Toxic encephalopathy: Secondary | ICD-10-CM | POA: Diagnosis present

## 2018-12-01 DIAGNOSIS — I5023 Acute on chronic systolic (congestive) heart failure: Secondary | ICD-10-CM | POA: Diagnosis not present

## 2018-12-01 DIAGNOSIS — I248 Other forms of acute ischemic heart disease: Secondary | ICD-10-CM | POA: Diagnosis present

## 2018-12-01 DIAGNOSIS — E1140 Type 2 diabetes mellitus with diabetic neuropathy, unspecified: Secondary | ICD-10-CM | POA: Diagnosis present

## 2018-12-01 DIAGNOSIS — I5043 Acute on chronic combined systolic (congestive) and diastolic (congestive) heart failure: Secondary | ICD-10-CM

## 2018-12-01 DIAGNOSIS — J9621 Acute and chronic respiratory failure with hypoxia: Secondary | ICD-10-CM | POA: Diagnosis present

## 2018-12-01 DIAGNOSIS — R05 Cough: Secondary | ICD-10-CM | POA: Diagnosis not present

## 2018-12-01 DIAGNOSIS — R7989 Other specified abnormal findings of blood chemistry: Secondary | ICD-10-CM | POA: Diagnosis not present

## 2018-12-01 DIAGNOSIS — E1122 Type 2 diabetes mellitus with diabetic chronic kidney disease: Secondary | ICD-10-CM | POA: Diagnosis present

## 2018-12-01 DIAGNOSIS — R092 Respiratory arrest: Secondary | ICD-10-CM | POA: Diagnosis not present

## 2018-12-01 DIAGNOSIS — J9611 Chronic respiratory failure with hypoxia: Secondary | ICD-10-CM | POA: Diagnosis not present

## 2018-12-01 DIAGNOSIS — J9602 Acute respiratory failure with hypercapnia: Secondary | ICD-10-CM

## 2018-12-01 DIAGNOSIS — J189 Pneumonia, unspecified organism: Secondary | ICD-10-CM | POA: Diagnosis not present

## 2018-12-01 DIAGNOSIS — J9690 Respiratory failure, unspecified, unspecified whether with hypoxia or hypercapnia: Secondary | ICD-10-CM | POA: Diagnosis not present

## 2018-12-01 DIAGNOSIS — I340 Nonrheumatic mitral (valve) insufficiency: Secondary | ICD-10-CM | POA: Diagnosis not present

## 2018-12-01 DIAGNOSIS — I4892 Unspecified atrial flutter: Secondary | ICD-10-CM | POA: Diagnosis present

## 2018-12-01 DIAGNOSIS — Z794 Long term (current) use of insulin: Secondary | ICD-10-CM | POA: Diagnosis not present

## 2018-12-01 DIAGNOSIS — E785 Hyperlipidemia, unspecified: Secondary | ICD-10-CM | POA: Diagnosis present

## 2018-12-01 DIAGNOSIS — J810 Acute pulmonary edema: Secondary | ICD-10-CM | POA: Diagnosis not present

## 2018-12-01 DIAGNOSIS — E875 Hyperkalemia: Secondary | ICD-10-CM | POA: Diagnosis present

## 2018-12-01 DIAGNOSIS — J441 Chronic obstructive pulmonary disease with (acute) exacerbation: Secondary | ICD-10-CM | POA: Diagnosis not present

## 2018-12-01 DIAGNOSIS — E872 Acidosis: Secondary | ICD-10-CM | POA: Diagnosis present

## 2018-12-01 DIAGNOSIS — J9601 Acute respiratory failure with hypoxia: Secondary | ICD-10-CM | POA: Diagnosis not present

## 2018-12-01 DIAGNOSIS — E1169 Type 2 diabetes mellitus with other specified complication: Secondary | ICD-10-CM | POA: Diagnosis not present

## 2018-12-01 DIAGNOSIS — R570 Cardiogenic shock: Secondary | ICD-10-CM | POA: Diagnosis present

## 2018-12-01 DIAGNOSIS — J811 Chronic pulmonary edema: Secondary | ICD-10-CM | POA: Diagnosis not present

## 2018-12-01 DIAGNOSIS — I2510 Atherosclerotic heart disease of native coronary artery without angina pectoris: Secondary | ICD-10-CM | POA: Diagnosis present

## 2018-12-01 DIAGNOSIS — I428 Other cardiomyopathies: Secondary | ICD-10-CM | POA: Diagnosis present

## 2018-12-01 DIAGNOSIS — J9622 Acute and chronic respiratory failure with hypercapnia: Secondary | ICD-10-CM | POA: Diagnosis present

## 2018-12-01 DIAGNOSIS — Z4682 Encounter for fitting and adjustment of non-vascular catheter: Secondary | ICD-10-CM | POA: Diagnosis not present

## 2018-12-01 DIAGNOSIS — G4733 Obstructive sleep apnea (adult) (pediatric): Secondary | ICD-10-CM | POA: Diagnosis present

## 2018-12-01 DIAGNOSIS — I472 Ventricular tachycardia: Secondary | ICD-10-CM | POA: Diagnosis present

## 2018-12-01 DIAGNOSIS — N170 Acute kidney failure with tubular necrosis: Secondary | ICD-10-CM | POA: Diagnosis present

## 2018-12-01 DIAGNOSIS — I130 Hypertensive heart and chronic kidney disease with heart failure and stage 1 through stage 4 chronic kidney disease, or unspecified chronic kidney disease: Secondary | ICD-10-CM | POA: Diagnosis present

## 2018-12-01 DIAGNOSIS — G8929 Other chronic pain: Secondary | ICD-10-CM | POA: Diagnosis present

## 2018-12-01 DIAGNOSIS — Y92009 Unspecified place in unspecified non-institutional (private) residence as the place of occurrence of the external cause: Secondary | ICD-10-CM | POA: Diagnosis not present

## 2018-12-01 DIAGNOSIS — I509 Heart failure, unspecified: Secondary | ICD-10-CM | POA: Diagnosis not present

## 2018-12-01 DIAGNOSIS — I361 Nonrheumatic tricuspid (valve) insufficiency: Secondary | ICD-10-CM | POA: Diagnosis not present

## 2018-12-01 DIAGNOSIS — N179 Acute kidney failure, unspecified: Secondary | ICD-10-CM | POA: Diagnosis not present

## 2018-12-01 DIAGNOSIS — R402122 Coma scale, eyes open, to pain, at arrival to emergency department: Secondary | ICD-10-CM | POA: Diagnosis present

## 2018-12-01 DIAGNOSIS — I480 Paroxysmal atrial fibrillation: Secondary | ICD-10-CM | POA: Diagnosis present

## 2018-12-01 LAB — GLUCOSE, CAPILLARY
Glucose-Capillary: 75 mg/dL (ref 70–99)
Glucose-Capillary: 88 mg/dL (ref 70–99)
Glucose-Capillary: 135 mg/dL — ABNORMAL HIGH (ref 70–99)
Glucose-Capillary: 154 mg/dL — ABNORMAL HIGH (ref 70–99)
Glucose-Capillary: 154 mg/dL — ABNORMAL HIGH (ref 70–99)
Glucose-Capillary: 209 mg/dL — ABNORMAL HIGH (ref 70–99)

## 2018-12-01 LAB — URINALYSIS, ROUTINE W REFLEX MICROSCOPIC
Bilirubin Urine: NEGATIVE
Glucose, UA: NEGATIVE mg/dL
Ketones, ur: NEGATIVE mg/dL
Leukocytes, UA: NEGATIVE
Nitrite: NEGATIVE
Protein, ur: NEGATIVE mg/dL
Specific Gravity, Urine: 1.006 (ref 1.005–1.030)
pH: 5 (ref 5.0–8.0)

## 2018-12-01 LAB — BASIC METABOLIC PANEL
Anion gap: 10 (ref 5–15)
Anion gap: 17 — ABNORMAL HIGH (ref 5–15)
Anion gap: 19 — ABNORMAL HIGH (ref 5–15)
BUN: 92 mg/dL — ABNORMAL HIGH (ref 8–23)
BUN: 94 mg/dL — ABNORMAL HIGH (ref 8–23)
BUN: 96 mg/dL — AB (ref 8–23)
CO2: 26 mmol/L (ref 22–32)
CO2: 29 mmol/L (ref 22–32)
CO2: 29 mmol/L (ref 22–32)
CREATININE: 2.15 mg/dL — AB (ref 0.61–1.24)
Calcium: 8.1 mg/dL — ABNORMAL LOW (ref 8.9–10.3)
Calcium: 8.8 mg/dL — ABNORMAL LOW (ref 8.9–10.3)
Calcium: 8.8 mg/dL — ABNORMAL LOW (ref 8.9–10.3)
Chloride: 90 mmol/L — ABNORMAL LOW (ref 98–111)
Chloride: 92 mmol/L — ABNORMAL LOW (ref 98–111)
Chloride: 93 mmol/L — ABNORMAL LOW (ref 98–111)
Creatinine, Ser: 3.15 mg/dL — ABNORMAL HIGH (ref 0.61–1.24)
Creatinine, Ser: 3.48 mg/dL — ABNORMAL HIGH (ref 0.61–1.24)
GFR calc Af Amer: 19 mL/min — ABNORMAL LOW (ref >60)
GFR calc Af Amer: 22 mL/min — ABNORMAL LOW (ref >60)
GFR calc Af Amer: 35 mL/min — ABNORMAL LOW (ref >60)
GFR calc non Af Amer: 19 mL/min — ABNORMAL LOW (ref >60)
GFR calc non Af Amer: 30 mL/min — ABNORMAL LOW (ref >60)
GFR, EST NON AFRICAN AMERICAN: 17 mL/min — AB (ref >60)
Glucose, Bld: 159 mg/dL — ABNORMAL HIGH (ref 70–99)
Glucose, Bld: 78 mg/dL (ref 70–99)
Glucose, Bld: 83 mg/dL (ref 70–99)
POTASSIUM: 5.4 mmol/L — AB (ref 3.5–5.1)
Potassium: 5.3 mmol/L — ABNORMAL HIGH (ref 3.5–5.1)
Potassium: 6.7 mmol/L (ref 3.5–5.1)
SODIUM: 135 mmol/L (ref 135–145)
Sodium: 132 mmol/L — ABNORMAL LOW (ref 135–145)
Sodium: 138 mmol/L (ref 135–145)

## 2018-12-01 LAB — POCT I-STAT 7, (LYTES, BLD GAS, ICA,H+H)
ACID-BASE EXCESS: 6 mmol/L — AB (ref 0.0–2.0)
Acid-Base Excess: 3 mmol/L — ABNORMAL HIGH (ref 0.0–2.0)
Bicarbonate: 30.7 mmol/L — ABNORMAL HIGH (ref 20.0–28.0)
Bicarbonate: 31.4 mmol/L — ABNORMAL HIGH (ref 20.0–28.0)
CALCIUM ION: 1.13 mmol/L — AB (ref 1.15–1.40)
Calcium, Ion: 1.16 mmol/L (ref 1.15–1.40)
HCT: 33 % — ABNORMAL LOW (ref 39.0–52.0)
HEMATOCRIT: 32 % — AB (ref 39.0–52.0)
HEMOGLOBIN: 10.9 g/dL — AB (ref 13.0–17.0)
Hemoglobin: 11.2 g/dL — ABNORMAL LOW (ref 13.0–17.0)
O2 SAT: 60 %
O2 Saturation: 98 %
PCO2 ART: 64.2 mmHg — AB (ref 32.0–48.0)
Patient temperature: 98.6
Patient temperature: 98.7
Potassium: 5.2 mmol/L — ABNORMAL HIGH (ref 3.5–5.1)
Potassium: 5.7 mmol/L — ABNORMAL HIGH (ref 3.5–5.1)
Sodium: 129 mmol/L — ABNORMAL LOW (ref 135–145)
Sodium: 132 mmol/L — ABNORMAL LOW (ref 135–145)
TCO2: 33 mmol/L — ABNORMAL HIGH (ref 22–32)
TCO2: 33 mmol/L — ABNORMAL HIGH (ref 22–32)
pCO2 arterial: 48 mmHg (ref 32.0–48.0)
pH, Arterial: 7.288 — ABNORMAL LOW (ref 7.350–7.450)
pH, Arterial: 7.423 (ref 7.350–7.450)
pO2, Arterial: 131 mmHg — ABNORMAL HIGH (ref 83.0–108.0)
pO2, Arterial: 31 mmHg — CL (ref 83.0–108.0)

## 2018-12-01 LAB — CBC WITH DIFFERENTIAL/PLATELET
Abs Immature Granulocytes: 0.04 10*3/uL (ref 0.00–0.07)
Basophils Absolute: 0 10*3/uL (ref 0.0–0.1)
Basophils Relative: 0 %
Eosinophils Absolute: 0 10*3/uL (ref 0.0–0.5)
Eosinophils Relative: 0 %
HEMATOCRIT: 32.5 % — AB (ref 39.0–52.0)
Hemoglobin: 9.4 g/dL — ABNORMAL LOW (ref 13.0–17.0)
Immature Granulocytes: 0 %
Lymphocytes Relative: 7 %
Lymphs Abs: 0.8 10*3/uL (ref 0.7–4.0)
MCH: 26.2 pg (ref 26.0–34.0)
MCHC: 28.9 g/dL — ABNORMAL LOW (ref 30.0–36.0)
MCV: 90.5 fL (ref 80.0–100.0)
MONO ABS: 0.8 10*3/uL (ref 0.1–1.0)
Monocytes Relative: 7 %
Neutro Abs: 9.7 10*3/uL — ABNORMAL HIGH (ref 1.7–7.7)
Neutrophils Relative %: 86 %
Platelets: 328 10*3/uL (ref 150–400)
RBC: 3.59 MIL/uL — ABNORMAL LOW (ref 4.22–5.81)
RDW: 17.3 % — ABNORMAL HIGH (ref 11.5–15.5)
WBC: 11.3 10*3/uL — ABNORMAL HIGH (ref 4.0–10.5)
nRBC: 0 % (ref 0.0–0.2)

## 2018-12-01 LAB — COOXEMETRY PANEL
Carboxyhemoglobin: 2.2 % — ABNORMAL HIGH (ref 0.5–1.5)
Methemoglobin: 0.8 % (ref 0.0–1.5)
O2 Saturation: 86.7 %
TOTAL HEMOGLOBIN: 10.3 g/dL — AB (ref 12.0–16.0)

## 2018-12-01 LAB — TROPONIN I
TROPONIN I: 0.42 ng/mL — AB (ref <0.03)
Troponin I: 0.36 ng/mL (ref <0.03)

## 2018-12-01 LAB — I-STAT TROPONIN, ED: TROPONIN I, POC: 0.32 ng/mL — AB (ref 0.00–0.08)

## 2018-12-01 LAB — BRAIN NATRIURETIC PEPTIDE: B NATRIURETIC PEPTIDE 5: 1666.7 pg/mL — AB (ref 0.0–100.0)

## 2018-12-01 LAB — CBG MONITORING, ED
Glucose-Capillary: 119 mg/dL — ABNORMAL HIGH (ref 70–99)
Glucose-Capillary: 74 mg/dL (ref 70–99)
Glucose-Capillary: 76 mg/dL (ref 70–99)

## 2018-12-01 LAB — HEPARIN LEVEL (UNFRACTIONATED): Heparin Unfractionated: 0.72 IU/mL — ABNORMAL HIGH (ref 0.30–0.70)

## 2018-12-01 LAB — MAGNESIUM: MAGNESIUM: 2.9 mg/dL — AB (ref 1.7–2.4)

## 2018-12-01 LAB — POTASSIUM: Potassium: 5.9 mmol/L — ABNORMAL HIGH (ref 3.5–5.1)

## 2018-12-01 LAB — TRIGLYCERIDES: Triglycerides: 116 mg/dL (ref <150)

## 2018-12-01 LAB — PHOSPHORUS: Phosphorus: 5.5 mg/dL — ABNORMAL HIGH (ref 2.5–4.6)

## 2018-12-01 MED ORDER — SODIUM BICARBONATE 8.4 % IV SOLN
50.0000 meq | Freq: Once | INTRAVENOUS | Status: AC
  Administered 2018-12-01: 50 meq via INTRAVENOUS
  Filled 2018-12-01: qty 50

## 2018-12-01 MED ORDER — FUROSEMIDE 10 MG/ML IJ SOLN
80.0000 mg | Freq: Once | INTRAMUSCULAR | Status: AC
  Administered 2018-12-01: 80 mg via INTRAVENOUS
  Filled 2018-12-01: qty 8

## 2018-12-01 MED ORDER — BISOPROLOL FUMARATE 5 MG PO TABS: 2.5000 mg | ORAL_TABLET | Freq: Every day | ORAL | Status: DC

## 2018-12-01 MED ORDER — NALOXONE HCL 4 MG/10ML IJ SOLN
1.0000 mg/h | INTRAVENOUS | Status: DC
  Filled 2018-12-01: qty 10

## 2018-12-01 MED ORDER — HEPARIN (PORCINE) 25000 UT/250ML-% IV SOLN: 1250.0000 [IU]/h | INTRAVENOUS | Status: DC

## 2018-12-01 MED ORDER — FENTANYL BOLUS VIA INFUSION
25.0000 ug | INTRAVENOUS | Status: DC | PRN
  Administered 2018-12-01: 25 ug via INTRAVENOUS
  Filled 2018-12-01: qty 25

## 2018-12-01 MED ORDER — SODIUM CHLORIDE 0.9% FLUSH
3.0000 mL | Freq: Two times a day (BID) | INTRAVENOUS | Status: DC
  Administered 2018-12-01: 3 mL via INTRAVENOUS

## 2018-12-01 MED ORDER — BUDESONIDE 0.5 MG/2ML IN SUSP
0.5000 mg | Freq: Two times a day (BID) | RESPIRATORY_TRACT | Status: DC
  Administered 2018-12-01 – 2018-12-04 (×7): 0.5 mg via RESPIRATORY_TRACT
  Filled 2018-12-01 (×7): qty 2

## 2018-12-01 MED ORDER — METHYLPREDNISOLONE SODIUM SUCC 125 MG IJ SOLR: 125.0000 mg | Freq: Once | INTRAMUSCULAR | Status: DC

## 2018-12-01 MED ORDER — MIDAZOLAM HCL 2 MG/2ML IJ SOLN
1.0000 mg | INTRAMUSCULAR | Status: DC | PRN
  Administered 2018-12-01: 1 mg via INTRAVENOUS
  Filled 2018-12-01 (×2): qty 2

## 2018-12-01 MED ORDER — INSULIN ASPART 100 UNIT/ML IV SOLN
5.0000 [IU] | Freq: Once | INTRAVENOUS | Status: AC
  Administered 2018-12-01: 5 [IU] via INTRAVENOUS

## 2018-12-01 MED ORDER — INSULIN ASPART 100 UNIT/ML ~~LOC~~ SOLN
0.0000 [IU] | SUBCUTANEOUS | Status: DC
  Administered 2018-12-01: 5 [IU] via SUBCUTANEOUS
  Administered 2018-12-01 (×2): 3 [IU] via SUBCUTANEOUS
  Administered 2018-12-01 – 2018-12-02 (×2): 2 [IU] via SUBCUTANEOUS
  Administered 2018-12-02: 3 [IU] via SUBCUTANEOUS
  Administered 2018-12-02: 5 [IU] via SUBCUTANEOUS
  Administered 2018-12-02 (×3): 3 [IU] via SUBCUTANEOUS
  Administered 2018-12-03: 2 [IU] via SUBCUTANEOUS
  Administered 2018-12-03: 11 [IU] via SUBCUTANEOUS
  Administered 2018-12-03: 3 [IU] via SUBCUTANEOUS
  Administered 2018-12-03: 2 [IU] via SUBCUTANEOUS
  Administered 2018-12-03 – 2018-12-04 (×3): 5 [IU] via SUBCUTANEOUS
  Administered 2018-12-04: 3 [IU] via SUBCUTANEOUS

## 2018-12-01 MED ORDER — SODIUM CHLORIDE 0.9 % IV SOLN: INTRAVENOUS | Status: DC

## 2018-12-01 MED ORDER — SODIUM CHLORIDE 0.9% FLUSH: 3.0000 mL | INTRAVENOUS | Status: DC | PRN

## 2018-12-01 MED ORDER — METHYLPREDNISOLONE SODIUM SUCC 125 MG IJ SOLR
60.0000 mg | Freq: Every day | INTRAMUSCULAR | Status: DC
  Administered 2018-12-01 – 2018-12-03 (×3): 60 mg via INTRAVENOUS
  Filled 2018-12-01 (×3): qty 2

## 2018-12-01 MED ORDER — NOREPINEPHRINE-SODIUM CHLORIDE 4-0.9 MG/250ML-% IV SOLN
0.0000 ug/min | INTRAVENOUS | Status: DC
  Administered 2018-12-01: 5 ug/min via INTRAVENOUS
  Administered 2018-12-01: 20 ug/min via INTRAVENOUS
  Filled 2018-12-01: qty 250

## 2018-12-01 MED ORDER — SODIUM ZIRCONIUM CYCLOSILICATE 5 G PO PACK
5.0000 g | PACK | ORAL | Status: AC
  Filled 2018-12-01: qty 1

## 2018-12-01 MED ORDER — SODIUM CHLORIDE 0.9 % IV SOLN: 250.0000 mL | INTRAVENOUS | Status: DC | PRN

## 2018-12-01 MED ORDER — MIDAZOLAM HCL 2 MG/2ML IJ SOLN
1.0000 mg | Freq: Once | INTRAMUSCULAR | Status: AC
  Administered 2018-12-01: 1 mg via INTRAVENOUS

## 2018-12-01 MED ORDER — DEXMEDETOMIDINE HCL IN NACL 400 MCG/100ML IV SOLN
0.4000 ug/kg/h | INTRAVENOUS | Status: DC
  Administered 2018-12-01: 0.4 ug/kg/h via INTRAVENOUS
  Administered 2018-12-01 – 2018-12-02 (×2): 1.2 ug/kg/h via INTRAVENOUS
  Administered 2018-12-02: 0.7 ug/kg/h via INTRAVENOUS
  Administered 2018-12-02 (×2): 1.2 ug/kg/h via INTRAVENOUS
  Filled 2018-12-01: qty 200
  Filled 2018-12-01 (×2): qty 100

## 2018-12-01 MED ORDER — CALCIUM GLUCONATE-NACL 1-0.675 GM/50ML-% IV SOLN
1.0000 g | Freq: Once | INTRAVENOUS | Status: AC
  Administered 2018-12-01: 1000 mg via INTRAVENOUS
  Filled 2018-12-01: qty 50

## 2018-12-01 MED ORDER — PROPOFOL 1000 MG/100ML IV EMUL
0.0000 ug/kg/min | INTRAVENOUS | Status: DC
  Administered 2018-12-01: 10 ug/kg/min via INTRAVENOUS

## 2018-12-01 MED ORDER — STERILE WATER FOR INJECTION IJ SOLN
INTRAMUSCULAR | Status: AC
  Filled 2018-12-01: qty 10

## 2018-12-01 MED ORDER — ROSUVASTATIN CALCIUM 20 MG PO TABS: 20.0000 mg | ORAL_TABLET | Freq: Every day | ORAL | Status: DC

## 2018-12-01 MED ORDER — HEPARIN (PORCINE) 25000 UT/250ML-% IV SOLN
1250.0000 [IU]/h | INTRAVENOUS | Status: DC
  Administered 2018-12-01: 1250 [IU]/h via INTRAVENOUS
  Administered 2018-12-02: 1400 [IU]/h via INTRAVENOUS
  Administered 2018-12-03 – 2018-12-06 (×4): 1250 [IU]/h via INTRAVENOUS
  Filled 2018-12-01 (×6): qty 250

## 2018-12-01 MED ORDER — SODIUM CHLORIDE 0.9 % IV SOLN: 1.0000 g | Freq: Once | INTRAVENOUS | Status: DC

## 2018-12-01 MED ORDER — CHLORHEXIDINE GLUCONATE 0.12% ORAL RINSE (MEDLINE KIT)
15.0000 mL | Freq: Two times a day (BID) | OROMUCOSAL | Status: DC
  Administered 2018-12-01 – 2018-12-02 (×3): 15 mL via OROMUCOSAL

## 2018-12-01 MED ORDER — FUROSEMIDE 10 MG/ML IJ SOLN
40.0000 mg | Freq: Once | INTRAMUSCULAR | Status: AC
  Administered 2018-12-01: 40 mg via INTRAVENOUS
  Filled 2018-12-01: qty 4

## 2018-12-01 MED ORDER — FENTANYL 2500MCG IN NS 250ML (10MCG/ML) PREMIX INFUSION
0.0000 ug/h | INTRAVENOUS | Status: DC
  Administered 2018-12-01: 25 ug/h via INTRAVENOUS
  Administered 2018-12-01: 350 ug/h via INTRAVENOUS
  Administered 2018-12-02: 175 ug/h via INTRAVENOUS
  Filled 2018-12-01 (×3): qty 250

## 2018-12-01 MED ORDER — NOREPINEPHRINE-SODIUM CHLORIDE 4-0.9 MG/250ML-% IV SOLN
INTRAVENOUS | Status: AC
Start: 2018-12-01 — End: 2018-12-01
  Filled 2018-12-01: qty 250

## 2018-12-01 MED ORDER — SODIUM POLYSTYRENE SULFONATE 15 GM/60ML PO SUSP
15.0000 g | Freq: Once | ORAL | Status: DC
  Filled 2018-12-01: qty 60

## 2018-12-01 MED ORDER — SODIUM ZIRCONIUM CYCLOSILICATE 5 G PO PACK
5.0000 g | PACK | Freq: Once | ORAL | Status: AC
  Administered 2018-12-01: 5 g via ORAL
  Filled 2018-12-01: qty 1

## 2018-12-01 MED ORDER — ALBUTEROL SULFATE (2.5 MG/3ML) 0.083% IN NEBU
5.0000 mg | INHALATION_SOLUTION | Freq: Once | RESPIRATORY_TRACT | Status: AC
  Administered 2018-12-01: 5 mg via RESPIRATORY_TRACT
  Filled 2018-12-01: qty 6

## 2018-12-01 MED ORDER — PROPOFOL 1000 MG/100ML IV EMUL
0.0000 ug/kg/min | INTRAVENOUS | Status: DC
  Filled 2018-12-01: qty 100

## 2018-12-01 MED ORDER — DEXAMETHASONE SODIUM PHOSPHATE 10 MG/ML IJ SOLN
10.0000 mg | Freq: Once | INTRAMUSCULAR | Status: AC
  Administered 2018-12-01: 10 mg via INTRAVENOUS
  Filled 2018-12-01: qty 1

## 2018-12-01 MED ORDER — MIDAZOLAM HCL 2 MG/2ML IJ SOLN: 2.0000 mg | Freq: Once | INTRAMUSCULAR | Status: DC

## 2018-12-01 MED ORDER — ETOMIDATE 2 MG/ML IV SOLN
INTRAVENOUS | Status: AC | PRN
  Administered 2018-12-01: 20 mg via INTRAVENOUS

## 2018-12-01 MED ORDER — HEPARIN SODIUM (PORCINE) 5000 UNIT/ML IJ SOLN
5000.0000 [IU] | Freq: Three times a day (TID) | INTRAMUSCULAR | Status: DC
  Administered 2018-12-01: 5000 [IU] via SUBCUTANEOUS
  Filled 2018-12-01: qty 1

## 2018-12-01 MED ORDER — ROSUVASTATIN CALCIUM 20 MG PO TABS
20.0000 mg | ORAL_TABLET | Freq: Every day | ORAL | Status: DC
  Administered 2018-12-01 – 2018-12-05 (×5): 20 mg
  Filled 2018-12-01 (×5): qty 1

## 2018-12-01 MED ORDER — IPRATROPIUM-ALBUTEROL 0.5-2.5 (3) MG/3ML IN SOLN
3.0000 mL | Freq: Four times a day (QID) | RESPIRATORY_TRACT | Status: DC
  Administered 2018-12-01 – 2018-12-07 (×28): 3 mL via RESPIRATORY_TRACT
  Filled 2018-12-01 (×28): qty 3

## 2018-12-01 MED ORDER — ROCURONIUM BROMIDE 50 MG/5ML IV SOLN
INTRAVENOUS | Status: AC | PRN
  Administered 2018-12-01: 80 mg via INTRAVENOUS

## 2018-12-01 MED ORDER — DEXTROSE 50 % IV SOLN
1.0000 | Freq: Once | INTRAVENOUS | Status: AC
  Administered 2018-12-01: 50 mL via INTRAVENOUS
  Filled 2018-12-01: qty 50

## 2018-12-01 MED ORDER — MIDAZOLAM HCL 2 MG/2ML IJ SOLN
INTRAMUSCULAR | Status: AC
  Administered 2018-12-01: 1 mg via INTRAVENOUS
  Filled 2018-12-01: qty 2

## 2018-12-01 MED ORDER — MIDAZOLAM HCL 2 MG/2ML IJ SOLN
1.0000 mg | INTRAMUSCULAR | Status: AC | PRN
  Administered 2018-12-01 (×3): 1 mg via INTRAVENOUS
  Filled 2018-12-01 (×2): qty 2

## 2018-12-01 MED ORDER — ALBUTEROL SULFATE (2.5 MG/3ML) 0.083% IN NEBU
10.0000 mg | INHALATION_SOLUTION | Freq: Once | RESPIRATORY_TRACT | Status: AC
  Administered 2018-12-01: 10 mg via RESPIRATORY_TRACT
  Filled 2018-12-01 (×2): qty 12

## 2018-12-01 MED ORDER — ALBUTEROL SULFATE (2.5 MG/3ML) 0.083% IN NEBU: 2.5000 mg | INHALATION_SOLUTION | RESPIRATORY_TRACT | Status: DC | PRN

## 2018-12-01 MED ORDER — GLUCAGON HCL RDNA (DIAGNOSTIC) 1 MG IJ SOLR
1.0000 mg | Freq: Once | INTRAMUSCULAR | Status: AC
  Administered 2018-12-01: 1 mg via INTRAVENOUS
  Filled 2018-12-01: qty 1

## 2018-12-01 MED ORDER — ORAL CARE MOUTH RINSE
15.0000 mL | OROMUCOSAL | Status: DC
  Administered 2018-12-01 – 2018-12-02 (×15): 15 mL via OROMUCOSAL

## 2018-12-01 MED ORDER — NALOXONE HCL 2 MG/2ML IJ SOSY
1.0000 mg | PREFILLED_SYRINGE | Freq: Once | INTRAMUSCULAR | Status: AC
  Administered 2018-12-01: 1 mg via INTRAVENOUS
  Filled 2018-12-01: qty 2

## 2018-12-01 MED ORDER — PANTOPRAZOLE SODIUM 40 MG IV SOLR
40.0000 mg | Freq: Every day | INTRAVENOUS | Status: DC
  Administered 2018-12-01 – 2018-12-05 (×5): 40 mg via INTRAVENOUS
  Filled 2018-12-01 (×5): qty 40

## 2018-12-01 MED ORDER — NALOXONE HCL 2 MG/2ML IJ SOSY
4.0000 mg | PREFILLED_SYRINGE | Freq: Once | INTRAMUSCULAR | Status: AC
  Administered 2018-12-01: 4 mg via INTRAVENOUS

## 2018-12-01 NOTE — ED Provider Notes (Signed)
[facility] EMERGENCY DEPARTMENT Provider Note   CSN: [id number] Arrival date & time: 11/30/18  2357     History   Chief Complaint Chief Complaint  Patient presents with  . Hypotension  . Weakness    HPI Ojas Coone is a 73 y.o. male.  The history is provided by the EMS personnel. The history is limited by the condition of the patient.  Weakness  Severity:  Severe Onset quality:  Gradual Duration:  3 days Timing:  Constant Progression:  Worsening Chronicity:  Recurrent Context: not recent infection   Associated symptoms: lethargy and shortness of breath   Associated symptoms: no abdominal pain, no aphasia, no chest pain, no dizziness, no fever, no vision change and no vomiting   Risk factors: congestive heart failure   Altered Mental Status  Presenting symptoms: lethargy and partial responsiveness   Severity:  Severe Most recent episode:  More than 2 days ago Episode history:  Single Timing:  Constant Progression:  Worsening Chronicity:  Recurrent Context: not alcohol use and not recent infection   Associated symptoms: weakness   Associated symptoms: no abdominal pain, no fever, no visual change and no vomiting     Past Medical History:  Diagnosis Date  . Atrial flutter ([place])    a. recurrent AFlutter with RVR;  b. Amiodarone Rx started 4/16  . CAD (coronary artery disease)    a. LHC 1/16:  mLAD diffuse disease, pLCx mild disease, dLCx with disease but too small for PCI, RCA ok, EF 25-30%  . Chronic pain   . Chronic systolic CHF (congestive heart failure) ([place])   . COPD (chronic obstructive pulmonary disease) ([place])   . Diabetes mellitus without complication ([place])   . Hypercholesteremia   . Hypertension   . NICM (nonischemic cardiomyopathy) ([place])    a.dx 2016. b. 2D echo 06/2016 - Last echo 07/01/16: mod dilated LV, mod LVH, EF 25-30%, mild-mod MR, sev LAE, mild-mod reduced RV systolic function, mild-mod TR, PASP 50mHG.  .Marland KitchenPAF (paroxysmal  atrial fibrillation) (HCC)    On amio - ot a candidate for flecainide due to cardiomyopathy, not a candidate for Tikosyn due to prolonged QT, and felt to be a poor candidate for ablation given left atrial size.  . Pulmonary hypertension ([place] [place]   . Tobacco abuse     Patient Active Problem List   Diagnosis Date Noted  . Acute respiratory failure ([facility] 11/22/2018  . Acute on chronic respiratory failure with hypoxia and hypercapnia ([facility] 11/13/2018  . Metabolic encephalopathy 025/36/6440 . Acute respiratory failure with hypoxia and hypercapnia ([facility] 07/26/2018  . Acute exacerbation of CHF (congestive heart failure) ([facility] 07/26/2018  . CHF exacerbation ([facility] 05/01/2018  . CHF (congestive heart failure) ([facility] 04/30/2018  . Chronic respiratory failure with hypoxia ([facility] 12/28/2017  . Atrial fibrillation with RVR (HChicago   . SVT (supraventricular tachycardia) ([place]   . COPD GOLD 0   . Medically noncompliant   . Panlobular emphysema (HGage   . OSA (obstructive sleep apnea)   . Pulmonary hypertension ([facility]   . Disorientation   . Pressure injury of skin 09/07/2016  . Acute on chronic combined systolic and diastolic CHF (congestive heart failure) ([facility]Bridge[place] 09/05/2016  . Skin lesion-left heal 09/05/2016  . Chronic systolic CHF (congestive heart failure) ([facility] 07/16/2016  . COPD (chronic obstructive pulmonary disease) ([facility] 07/16/2016  . Uncontrolled type 2 diabetes mellitus with complication ([place]   . Diabetic polyneuropathy associated with diabetes mellitus due to underlying condition (H[place]   .  Normocytic anemia 06/29/2016  . CAD - Non-obstructive by LHC1/16 01/21/2016  . Nonischemic cardiomyopathy ([place]) 10/09/2015  . PAF (paroxysmal atrial fibrillation) ([place])   . Chronic pain 02/19/2015  . Morbid obesity ([place]) 02/13/2015  . DOE (dyspnea on exertion)   . Elevated troponin I level 11/01/2014  . COPD exacerbation ([place]) 11/01/2014  . Essential hypertension 10/31/2014  . Type 2 diabetes mellitus  with neuropathy 10/31/2014  . Hyperlipidemia  10/31/2014  . Cigarette smoker 10/31/2014    Past Surgical History:  Procedure Laterality Date  . CARDIOVERSION N/A 07/30/2018   Procedure: CARDIOVERSION;  Surgeon: Larey Dresser, MD;  Location: [facility] ENDOSCOPY;  Service: Cardiovascular;  Laterality: N/A;  . LEFT AND RIGHT HEART CATHETERIZATION WITH CORONARY ANGIOGRAM N/A 11/06/2014   Procedure: LEFT AND RIGHT HEART CATHETERIZATION WITH CORONARY ANGIOGRAM;  Surgeon: Jettie Booze, MD;  Location: [facility] CATH LAB;  Service: Cardiovascular;  Laterality: N/A;  . SPLENECTOMY          Home Medications    Prior to Admission medications   Medication Sig Start Date End Date Taking? Authorizing Provider  albuterol (PROVENTIL HFA;VENTOLIN HFA) 108 (90 BASE) MCG/ACT inhaler Inhale 2 puffs into the lungs every 6 (six) hours as needed for wheezing or shortness of breath. Patient not taking: Reported on 11/14/2018 11/07/14   Kinnie Feil, MD  bisoprolol (ZEBETA) 5 MG tablet Take 0.5 tablets (2.5 mg total) by mouth daily for 30 days. 11/26/18 12/26/18  Amin, Jeanella Flattery, MD  budesonide-formoterol (SYMBICORT) 160-4.5 MCG/ACT inhaler Inhale 2 puffs into the lungs 2 (two) times daily.    [provider]  diazepam (VALIUM) 10 MG tablet Take 0.5 tablets (5 mg total) by mouth daily as needed for anxiety or sleep. Patient taking differently: Take 10 mg by mouth daily as needed for anxiety or sleep.  12/19/16   Theodis Blaze, MD  ferrous sulfate 325 (65 FE) MG tablet Take 325 mg by mouth 3 (three) times daily as needed (for supplementation).    [provider]  fluticasone (FLONASE) 50 MCG/ACT nasal spray Place 2 sprays into both nostrils daily as needed for allergies.     [provider]  folic acid (FOLVITE) 1 MG tablet Take 1 mg by mouth daily.    [provider]  gabapentin (NEURONTIN) 600 MG tablet Take 1 tablet (600 mg total) by mouth 2 (two) times daily. 12/19/16   Theodis Blaze, MD  glucose 4 GM chewable tablet Chew 4 tablets by mouth See admin instructions. Chew 4 tablets by mouth as needed for low blood sugar - repeat every 15 minutes if blood sugar less than 70    [provider]  guaifenesin (HUMIBID E) 400 MG TABS tablet Take 1 tablet (400 mg total) by mouth every 6 (six) hours as needed. Patient taking differently: Take 400 mg by mouth every 6 (six) hours as needed (cough).  08/02/18   Regalado, Belkys A, MD  hydrocortisone (ANUSOL-HC) 2.5 % rectal cream Place 1 application rectally 2 (two) times daily as needed for hemorrhoids or itching.     [provider]  hydrocortisone 1 % lotion Apply 1 application topically See admin instructions. Apply small amount to back twice daily for itchy rash    [provider]  insulin aspart protamine - aspart (NOVOLOG MIX 70/30 FLEXPEN) (70-30) 100 UNIT/ML FlexPen Inject 0.05 mLs (5 Units total) into the skin 2 (two) times daily. Patient taking differently: Inject 30 Units into the skin 2 (two) times daily.  08/02/18   Regalado, Belkys A, MD  ipratropium-albuterol (DUONEB) 0.5-2.5 (3) MG/3ML SOLN Take 3 mLs by nebulization 2 (two) times daily.    [provider]  latanoprost (XALATAN) 0.005 % ophthalmic solution Place 1 drop into both eyes at bedtime.    [provider]  lisinopril (PRINIVIL,ZESTRIL) 10 MG tablet Take 1 tablet (10 mg total) by mouth daily for 30 days. 11/27/18 12/27/18  Damita Lack, MD  Magnesium Oxide 420 (252 Mg) MG TABS Take 420 mg by mouth 2 (two) times daily.    [provider]  metFORMIN (GLUCOPHAGE-XR) 500 MG 24 hr tablet Take 1,000 mg by mouth 2 (two) times daily with a meal.    [provider]  oxyCODONE-acetaminophen (PERCOCET) 10-325 MG tablet Take 1 tablet by mouth every 6 (six) hours as needed for pain.    [provider]  OXYGEN Inhale 3 L into the lungs continuous.     [provider]  potassium chloride SA  (K-DUR,KLOR-CON) 20 MEQ tablet Take 2 tablets (40 mEq total) by mouth every morning AND 1 tablet (20 mEq total) at bedtime. 11/26/18   Amin, Jeanella Flattery, MD  rivaroxaban (XARELTO) 20 MG TABS tablet Take 1 tablet (20 mg total) by mouth daily with supper. Patient taking differently: Take 20 mg by mouth daily at 6 PM.  01/20/17   Jettie Booze, MD  rosuvastatin (CRESTOR) 20 MG tablet Take 1 tablet (20 mg total) by mouth daily. 05/07/18   Matilde Haymaker, MD  spironolactone (ALDACTONE) 25 MG tablet Take 1 tablet (25 mg total) by mouth at bedtime. 02/04/18 02/04/19  Shirley Friar, PA-C  torsemide (DEMADEX) 20 MG tablet Take 4 tablets (80 mg total) by mouth 2 (two) times daily for 30 days. 11/26/18 12/26/18  Damita Lack, MD    Family History Family History  Problem Relation Age of Onset  . Heart disease Mother   . Hypertension Mother   . Heart failure Mother   . Heart disease Father   . Hypertension Sister   . Heart attack Neg Hx   . Stroke Neg Hx     Social History Social History   Tobacco Use  . Smoking status: Current Every Day Smoker    Packs/day: 1.00    Years: 34.00    Pack years: 34.00    Types: Cigarettes  . Smokeless tobacco: Never Used  Substance Use Topics  . Alcohol use: No    Alcohol/week: 0.0 standard drinks  . Drug use: No     Allergies   Patient has no known allergies.   Review of Systems Review of Systems  Unable to perform ROS: Acuity of condition  Constitutional: Negative for fever.  Respiratory: Positive for shortness of breath and wheezing.   Cardiovascular: Negative for chest pain.  Gastrointestinal: Negative for abdominal pain and vomiting.  Neurological: Positive for weakness. Negative for dizziness.     Physical Exam Updated Vital Signs BP 116/70   Pulse (!) 58   Temp 98.5 F (36.9 C)   Resp (!) 21   Ht 6' (1.829 m)   Wt 56 kg   SpO2 100%   BMI 16.74 kg/m   Physical Exam Constitutional:      Appearance: He is obese. He  is not toxic-appearing.  HENT:     Head: Normocephalic and atraumatic.     Right Ear: Tympanic membrane normal.     Left Ear: Tympanic membrane normal.     Nose: Nose normal.  Mouth/Throat:     Mouth: Mucous membranes are moist.     Pharynx: Oropharynx is clear.  Eyes:     Comments: Pinpoint pupils  Neck:     Musculoskeletal: Normal range of motion and neck supple. No neck rigidity.  Cardiovascular:     Rate and Rhythm: Regular rhythm.     Pulses: Normal pulses.     Heart sounds: Normal heart sounds.  Pulmonary:     Breath sounds: Wheezing and rales present.  Abdominal:     General: There is no distension.     Tenderness: There is no abdominal tenderness.  Musculoskeletal:     Right lower leg: Edema present.     Left lower leg: Edema present.  Skin:    General: Skin is warm and dry.     Capillary Refill: Capillary refill takes less than 2 seconds.  Neurological:     Mental Status: He is disoriented.     GCS: GCS eye subscore is 2. GCS verbal subscore is 3. GCS motor subscore is 5.  Psychiatric:     Comments: unable      ED Treatments / Results  Labs (all labs ordered are listed, but only abnormal results are displayed) Labs Reviewed  CBC WITH DIFFERENTIAL/PLATELET - Abnormal; Notable for the following components:      Result Value   WBC 11.3 (*)    RBC 3.59 (*)    Hemoglobin 9.4 (*)    HCT 32.5 (*)    MCHC 28.9 (*)    RDW 17.3 (*)    Neutro Abs 9.7 (*)    All other components within normal limits  BASIC METABOLIC PANEL - Abnormal; Notable for the following components:   Sodium 132 (*)    Potassium 6.7 (*)    Chloride 93 (*)    BUN 94 (*)    Creatinine, Ser 3.48 (*)    Calcium 8.1 (*)    GFR calc non Af Amer 17 (*)    GFR calc Af Amer 19 (*)    All other components within normal limits  BRAIN NATRIURETIC PEPTIDE - Abnormal; Notable for the following components:   B Natriuretic Peptide 1,666.7 (*)    All other components within normal limits  POTASSIUM  - Abnormal; Notable for the following components:   Potassium 5.9 (*)    All other components within normal limits  I-STAT TROPONIN, ED - Abnormal; Notable for the following components:   Troponin i, poc 0.32 (*)    All other components within normal limits  POCT I-STAT 7, (LYTES, BLD GAS, ICA,H+H) - Abnormal; Notable for the following components:   pH, Arterial 7.288 (*)    pCO2 arterial 64.2 (*)    pO2, Arterial 131.0 (*)    Bicarbonate 30.7 (*)    TCO2 33 (*)    Acid-Base Excess 3.0 (*)    Sodium 129 (*)    Potassium 5.7 (*)    HCT 33.0 (*)    Hemoglobin 11.2 (*)    All other components within normal limits  BLOOD GAS, ARTERIAL  URINALYSIS, ROUTINE W REFLEX MICROSCOPIC  CBG MONITORING, ED  I-STAT ARTERIAL BLOOD GAS, ED    EKG EKG Interpretation  Date/Time:  Wednesday December 01 2018 00:03:30 EST Ventricular Rate:  53 PR Interval:    QRS Duration: 121 QT Interval:  425 QTC Calculation: 399 R Axis:   106 Text Interpretation:  Sinus rhythm Probable left atrial enlargement IVCD, consider atypical RBBB non specific st t changes lateral leads Confirmed  by Veatrice Kells [phone]) on 12/01/2018 12:17:51 AM   Radiology Dg Chest Portable 1 View  Result Date: 12/01/2018 CLINICAL DATA:  Intubation and OG tube placement. EXAM: PORTABLE CHEST 1 VIEW COMPARISON:  Radiograph earlier this day FINDINGS: Endotracheal tube tip 5.5 cm from the carina. Enteric tube tip below the diaphragm, side-port not well visualized. Again seen cardiomegaly, vascular congestion and pulmonary edema. Retrocardiac opacity may be overlapping soft tissue attenuation or pleural effusion. IMPRESSION: 1. Endotracheal tube 5.5 cm from the carina. Enteric tube tip below the diaphragm, side-port not well visualized. 2. Cardiomegaly, vascular congestion and pulmonary edema. Retrocardiac opacity may be overlapping soft tissue attenuation or pleural effusion. Electronically Signed   By: Keith Rake M.D.   On: 12/01/2018  01:27   Dg Chest Portable 1 View  Result Date: 12/01/2018 CLINICAL DATA:  Shortness of breath EXAM: PORTABLE CHEST 1 VIEW COMPARISON:  11/21/2018, 11/14/2018, 11/21/2018 FINDINGS: Non inclusion of the left base. Cardiomegaly with vascular congestion and moderate pulmonary edema. Suspected left pleural effusion. No pneumothorax. IMPRESSION: Similar appearance of cardiomegaly, vascular congestion, and moderate pulmonary edema with probable left pleural effusion Electronically Signed   By: Donavan Foil M.D.   On: 12/01/2018 00:34    Procedures Procedure Name: Intubation Date/Time: 12/01/2018 2:06 AM Performed by: Veatrice Kells, MD Pre-anesthesia Checklist: Patient identified and Suction available Oxygen Delivery Method: Ambu bag Preoxygenation: Pre-oxygenation with 100% oxygen Induction Type: Rapid sequence Ventilation: Mask ventilation without difficulty Laryngoscope Size: Glidescope and 4 Grade View: Grade II Tube size: 7.0 mm Number of attempts: 1 Airway Equipment and Method: Patient positioned with wedge pillow Tube secured with: ETT holder Dental Injury: Teeth and Oropharynx as per pre-operative assessment  Difficulty Due To: Difficulty was anticipated      (including critical care time)  Medications Ordered in ED Medications  sterile water (preservative free) injection (has no administration in time range)  naloxone HCl (NARCAN) 4 mg in dextrose 5 % 250 mL infusion (1 mg/hr Intravenous Not Given 12/01/18 0144)  albuterol (PROVENTIL) (2.5 MG/3ML) 0.083% nebulizer solution 10 mg (has no administration in time range)  sodium zirconium cyclosilicate (LOKELMA) packet 5 g (0 g Oral Hold 12/01/18 0143)  calcium gluconate 1 g/ 50 mL sodium chloride IVPB (1,000 mg Intravenous New Bag/Given 12/01/18 0134)  fentaNYL 2514mg in NS 254m(1040mml) infusion-PREMIX (75 mcg/hr Intravenous Rate/Dose Change 12/01/18 0147)  naloxone (NARCAN) injection 1 mg (1 mg Intravenous Given 12/01/18 0017)    glucagon (human recombinant) (GLUCAGEN) injection 1 mg (1 mg Intravenous Given 12/01/18 0022)  albuterol (PROVENTIL) (2.5 MG/3ML) 0.083% nebulizer solution 5 mg (5 mg Nebulization Given 12/01/18 0030)  dexamethasone (DECADRON) injection 10 mg (10 mg Intravenous Given 12/01/18 0029)  sodium bicarbonate injection 50 mEq (50 mEq Intravenous Given 12/01/18 0102)  insulin aspart (novoLOG) injection 5 Units (5 Units Intravenous Given 12/01/18 0101)    And  dextrose 50 % solution 50 mL (50 mLs Intravenous Given 12/01/18 0059)  etomidate (AMIDATE) injection (20 mg Intravenous Given 12/01/18 0107)  rocuronium (ZEMURON) injection (80 mg Intravenous Given 12/01/18 0108)  furosemide (LASIX) injection 40 mg (40 mg Intravenous Given 12/01/18 0154)   MDM Reviewed: previous chart, nursing note and vitals Reviewed previous: labs, ECG and x-ray Interpretation: labs, ECG and x-ray (hyperkalemia increased Co2 renal failure) Total time providing critical care: 75-105 minutes (bipap initiated immediately, treatment of hyperkalemia). This excludes time spent performing separately reportable procedures and services. Consults: critical care    Final Clinical Impressions(s) / ED Diagnoses   Final diagnoses:  AKI (acute kidney injury) ([place])  Acute pulmonary edema ([place])  Respiratory arrest ([place])  Hyperkalemia    Admit to ICU   Almadelia Looman, MD 12/01/18 0352

## 2018-12-01 NOTE — Progress Notes (Signed)
[facility] Progress Note Patient Name: Nicholas Caldwell DOB: 04/23/1946 MRN: [id number]   Date of Service  12/01/2018  HPI/Events of Note  Inadequate sedation and analgesia with fixed dose Fentanyl infusion at 20 mcg, + Troponin  eICU Interventions  Fentanyl infusion changed to titratable dosing. Trend Troponin.        Kerry Kass Khloi Rawl 12/01/2018, 5:52 AM

## 2018-12-01 NOTE — Progress Notes (Signed)
Patient repeatedly reaching up for ETTube despite frequent reminders. Patient currently on fentanyl and precedex gtts without improvement. Compliant with ventilator, but still interferring with care. For safety restraint order requested from [facility] team. Bilateral wrist restraints placed and awaiting input of order.  Milford Cage, RN

## 2018-12-01 NOTE — Progress Notes (Signed)
NAME:  Nicholas Caldwell, MRN:  [id number], DOB:  July 26, 1946, LOS: 0 ADMISSION DATE:  11/30/2018, CONSULTATION DATE: 1/29 REFERRING MD:  Dr. Randal Buba, CHIEF COMPLAINT:  AMS   Brief History   This is a 73 year old male with a history of COPD and HFrEF who presented with lethargy, hypotension and bradycardic who was intuabted for acute hypoxic/hypercarbic respiratory failure.   History of present illness   This is a 73 year old male with a history of atrial fibrillation on xeralto, COPD (on 3LNC at home), systolic CHF (EF 86-57%), nonischemic cardiomyopathy, DM type 2, HTN, and chronic pain who presented to [facility] on 1/29 for hypotension and weakness. He had taken extra pain medications before going to bed. He takes valium, neurontin, and percocet at home. The wife was worried that he was getting too sleepy and started shaking. He was brought to the ED where he was found to be lethargic. CXR was concerning for pulmonary edema and he was started on BiPAP and given 40 mg Lasix. Labs were significant for K 6.7, Cr 3.48 (baseline of around 1), BNP 1700, WBC 11.2, Hgb 9.4. He was given insulin/dextrose, bicarb, and calcium. He was also given narcan with no improvement in his mental status. His mental status worsened and he was intubated for hypercarbic and hypoxemic respiratory failure.    Patient was admitted from 1/19-1/24 for a COPD and CHF exacerbation. Patient was aggressively diuresed and given IV steroids during that hospitalization. Weight on discharge was 56.2kg. Weight on admission was 56 kg. He was discharged home on torsemide 80 mg BID.   Past Medical History   Past Medical History:  Diagnosis Date  . Atrial flutter ([place])    a. recurrent AFlutter with RVR;  b. Amiodarone Rx started 4/16  . CAD (coronary artery disease)    a. LHC 1/16:  mLAD diffuse disease, pLCx mild disease, dLCx with disease but too small for PCI, RCA ok, EF 25-30%  . Chronic pain   . Chronic systolic CHF (congestive heart  failure) ([place])   . COPD (chronic obstructive pulmonary disease) ([place])   . Diabetes mellitus without complication ([place])   . Hypercholesteremia   . Hypertension   . NICM (nonischemic cardiomyopathy) ([place])    a.dx 2016. b. 2D echo 06/2016 - Last echo 07/01/16: mod dilated LV, mod LVH, EF 25-30%, mild-mod MR, sev LAE, mild-mod reduced RV systolic function, mild-mod TR, PASP 33mHG.  .Marland KitchenPAF (paroxysmal atrial fibrillation) (H[facility])    On amio - ot a candidate for flecainide due to cardiomyopathy, not a candidate for Tikosyn due to prolonged QT, and felt to be a poor candidate for ablation given left atrial size.  . [place]monary hypertension ([place]   . Tobacco abuse      Significant Hospital Events   1/29> Intubated and admitted to ICU  Consults:  None  Procedures:  ETT 1/29 >  Significant Diagnostic Tests:  Renal U/S pending  CXR: Vascular congestion, pulmonary edema Micro Data:    Antimicrobials:     Interim history/subjective:  No acute events overnight. He was intubated and sedated. Not following commands.   Objective   Blood pressure (!) 113/44, pulse (!) 55, temperature 98.5 F (36.9 C), resp. rate (!) 22, height 6' (1.829 m), weight 127 kg, SpO2 100 %.    Vent Mode: PRVC FiO2 (%):  [40 %-60 %] 40 % Set Rate:  [22 bmp] 22 bmp Vt Set:  [[846mL] 620 mL PEEP:  [5 cmH20] 5 cmH20 Plateau Pressure:  [  27 cmH20-32 cmH20] 32 cmH20   Intake/Output Summary (Last 24 hours) at 12/01/2018 0657 Last data filed at 12/01/2018 5681 Gross per 24 hour  Intake 164.11 ml  Output 1500 ml  Net -1335.89 ml   Filed Weights   12/01/18 0009 12/01/18 0416  Weight: 56 kg 127 kg    Examination: General: Intubated, sedated, resting comfortably HENT: Atraumatic, normocephalic Lungs: Minimal bibasilar crackles, no wheezing or rhonchi, on ventilator Cardiovascular: Bradycardic, no murmurs, rubs or gallops Abdomen: Soft, non-tender, +BS Extremities: 1+ BL LE edema, SCDs in place Neuro: Sedated, not  following commands GU: Condom cath in place  Resolved Hospital Problem list    Assessment & Plan:   Acute on chronic respiratory failure, currently intubated Presumed OSA -Likely multifactorial, has a history of HFrEF and COPD on 3L [place] at home. He also may have OSA, sleep study being done at [place]. He also takes multiple pain medications at home that may have decreased his respiratory drive. This may be a acute on chronic heart failure exacerbation, he does have an elevated BNP however this has been elevated in the past, CXR does show pulmonary edema and he appeared fluid overloaded on admission. Patient was bradycardic and hypotensive this AM and he was started on pressors.  -Full vent support -Initial ABG showed respiratory acidosis, this resolved on follow up ABG.   -Currently on the ventilator  -S/p 40 mg lasix x2 -Scheduled duonebs -Continue propofol and fentanyl for RASS 0 to -1 -Daily sedation vacation and SBT -Wean down sedation today, we will continue diuresis and consider extubation tomorrow  HFrEF:  Echo in June 2019 showed EF 40-45%, PA peak pressure 62 mmHg. Follows with heart failure service.  S/p 40 mg Lasix x2. Had good output of -1,300. He appears to still be fluid overloaded, with elevated JVD, 1+ BL edema, and bibasilar crackles.  -Consult Heart failure -Will place central line to measure CVP -Continue diuresis today, 80 mg IV lasix -Strict I+Os, daily weights -Holding home spironolactone, torsemide, and lasix -Started vasopressors for hypotension -Continue crestor and zebeta  Paroxsymal A fibrillation NSTEMI -Troponin elevated to 0.36, did not present with chest. EKG showed dynamic changes.  -Likely 2/2 demand ischemia, repeat troponin was unchanged. Troponins 0.36 > 0.4  -Heparin drip -New t wave inversions on ECG, repeat EKG showed some ST depressions in pre-cordial leads -Cardiology consult > suspect demand ischemia.   AKI: -Cr increased to 3.48 on admission,  baseline around 1. Repeat Cr in 3.15. Possibly 2/2 HF exacerbation.  -Repeat BMP -Renal ultrasound  COPD: -Patient is on symbicort and duonebs at home. He is on oxygen 3L at home. Patient does not have any wheezing on exam. He does not appear to be in an acute exacerbation at this time and we will not start steroids at this time.  -Continue duonebs scheduled -Continue symbicort  Hyperkalemia: Has been given temporizing measures in the ED -BMP this AM showed K 5.4, will give kayexelate  -Repeat BMP in PM -EKG showed no peaked t waves  DM: -On metformin at home, hold this -SSI -Monitor CBGs  Best practice:  Diet: NPO Pain/Anxiety/Delirium protocol (if indicated): Fentanyl and propofol VAP protocol (if indicated): Per protocol DVT prophylaxis: Xeralto GI prophylaxis: PPI Glucose control: SSI Mobility: BR Code Status: Full Family Communication:  Disposition: Remain in ICU  Labs   CBC: Recent Labs  Lab 12/01/18 0011 12/01/18 0052 12/01/18 0323  WBC 11.3*  --   --   NEUTROABS 9.7*  --   --  HGB 9.4* 11.2* 10.9*  HCT 32.5* 33.0* 32.0*  MCV 90.5  --   --   PLT 328  --   --     Basic Metabolic Panel: Recent Labs  Lab 11/25/18 0408 11/26/18 0839 12/01/18 0011 12/01/18 0052 12/01/18 0323 12/01/18 0408  NA 139 134* 132* 129* 132* 135  K 3.0* 4.3 6.7* 5.9*  5.7* 5.2* 5.4*  CL 90* 92* 93*  --   --  90*  CO2 40* 34* 29  --   --  26  GLUCOSE 162* 254* 83  --   --  78  BUN 26* 33* 94*  --   --  96*  CREATININE 1.12 1.06 3.48*  --   --  3.15*  CALCIUM 8.3* 8.1* 8.1*  --   --  8.8*  MG  --  2.3  --   --   --  2.9*  PHOS  --   --   --   --   --  5.5*   GFR: Estimated Creatinine Clearance: 30.1 mL/min (A) (by C-G formula based on SCr of 3.15 mg/dL (H)). Recent Labs  Lab 12/01/18 0011  WBC 11.3*    Liver Function Tests: No results for input(s): AST, ALT, ALKPHOS, BILITOT, PROT, ALBUMIN in the last 168 hours. No results for input(s): LIPASE, AMYLASE in the last  168 hours. No results for input(s): AMMONIA in the last 168 hours.  ABG    Component Value Date/Time   PHART 7.423 12/01/2018 0323   PCO2ART 48.0 12/01/2018 0323   PO2ART 31.0 (LL) 12/01/2018 0323   HCO3 31.4 (H) 12/01/2018 0323   TCO2 33 (H) 12/01/2018 0323   ACIDBASEDEF 2.0 02/19/2015 1003   O2SAT 60.0 12/01/2018 0323     Coagulation Profile: No results for input(s): INR, PROTIME in the last 168 hours.  Cardiac Enzymes: Recent Labs  Lab 12/01/18 0408  TROPONINI 0.36*    HbA1C: Hgb A1c MFr Bld  Date/Time Value Ref Range Status  11/13/2018 06:02 AM 6.5 (H) 4.8 - 5.6 % Final    Comment:    (NOTE) Pre diabetes:          5.7%-6.4% Diabetes:              >6.4% Glycemic control for   <7.0% adults with diabetes   05/01/2018 05:10 AM 8.1 (H) 4.8 - 5.6 % Final    Comment:    (NOTE) Pre diabetes:          5.7%-6.4% Diabetes:              >6.4% Glycemic control for   <7.0% adults with diabetes     CBG: Recent Labs  Lab 11/26/18 0824 12/01/18 0013 12/01/18 0205 12/01/18 0409 12/01/18 0450  GLUCAP 224* 76 119* 74 75    Review of Systems:   Unable to obtain due to patients mental status  Past Medical History  He,  has a past medical history of Atrial flutter ([place]), CAD (coronary artery disease), Chronic pain, Chronic systolic CHF (congestive heart failure) ([place]), COPD (chronic obstructive pulmonary disease) ([place]), Diabetes mellitus without complication ([place]), Hypercholesteremia, Hypertension, NICM (nonischemic cardiomyopathy) ([place]), PAF (paroxysmal atrial fibrillation) ([place]), Pulmonary hypertension ([place]), and Tobacco abuse.   Surgical History    Past Surgical History:  Procedure Laterality Date  . CARDIOVERSION N/A 07/30/2018   Procedure: CARDIOVERSION;  Surgeon: Larey Dresser, MD;  Location: [facility] ENDOSCOPY;  Service: Cardiovascular;  Laterality: N/A;  . LEFT AND RIGHT HEART CATHETERIZATION WITH CORONARY ANGIOGRAM N/A  11/06/2014   Procedure: LEFT AND RIGHT HEART  CATHETERIZATION WITH CORONARY ANGIOGRAM;  Surgeon: Jettie Booze, MD;  Location[facility] C[facility] LAB;  Service: Cardiovascular;  Laterality: N/A;  . SPLENECTOMY       Social History   reports that he has been smoking cigarettes. He has a 34.00 pack-year smoking history. He has never used smokeless tobacco. He reports that he does not drink alcohol or use drugs.   Family History   His family history includes Heart disease in his father and mother; Heart failure in his mother; Hypertension in his mother and sister. There is no history of Heart attack or Stroke.   Allergies No Known Allergies   Home Medications  Prior to Admission medications   Medication Sig Start Date End Date Taking? Authorizing Provider  bisoprolol (ZEBETA) 5 MG tablet Take 0.5 tablets (2.5 mg total) by mouth daily for 30 days. 11/26/18 12/26/18  Amin, Jeanella Flattery, MD  budesonide-formoterol (SYMBICORT) 160-4.5 MCG/ACT inhaler Inhale 2 puffs into the lungs 2 (two) times daily.    [provider]  diazepam (VALIUM) 10 MG tablet Take 0.5 tablets (5 mg total) by mouth daily as needed for anxiety or sleep. Patient taking differently: Take 10 mg by mouth daily as needed for anxiety or sleep.  12/19/16   Theodis Blaze, MD  ferrous sulfate 325 (65 FE) MG tablet Take 325 mg by mouth 3 (three) times daily as needed (for supplementation).    [provider]  fluticasone (FLONASE) 50 MCG/ACT nasal spray Place 2 sprays into both nostrils daily as needed for allergies.     [provider]  folic acid (FOLVITE) 1 MG tablet Take 1 mg by mouth daily.    [provider]  gabapentin (NEURONTIN) 600 MG tablet Take 1 tablet (600 mg total) by mouth 2 (two) times daily. 12/19/16   Theodis Blaze, MD  glucose 4 GM chewable tablet Chew 4 tablets by mouth See admin instructions. Chew 4 tablets by mouth as needed for low blood sugar - repeat every 15 minutes if blood sugar less than 70    [provider]    guaifenesin (HUMIBID E) 400 MG TABS tablet Take 1 tablet (400 mg total) by mouth every 6 (six) hours as needed. Patient taking differently: Take 400 mg by mouth every 6 (six) hours as needed (cough).  08/02/18   Regalado, Belkys A, MD  hydrocortisone (ANUSOL-HC) 2.5 % rectal cream Place 1 application rectally 2 (two) times daily as needed for hemorrhoids or itching.     [provider]  hydrocortisone 1 % lotion Apply 1 application topically See admin instructions. Apply small amount to back twice daily for itchy rash    [provider]  insulin aspart protamine - aspart (NOVOLOG MIX 70/30 FLEXPEN) (70-30) 100 UNIT/ML FlexPen Inject 0.05 mLs (5 Units total) into the skin 2 (two) times daily. Patient taking differently: Inject 30 Units into the skin 2 (two) times daily.  08/02/18   Regalado, Belkys A, MD  ipratropium-albuterol (DUONEB) 0.5-2.5 (3) MG/3ML SOLN Take 3 mLs by nebulization 2 (two) times daily.    [provider]  latanoprost (XALATAN) 0.005 % ophthalmic solution Place 1 drop into both eyes at bedtime.    [provider]  lisinopril (PRINIVIL,ZESTRIL) 10 MG tablet Take 1 tablet (10 mg total) by mouth daily for 30 days. 11/27/18 12/27/18  Damita Lack, MD  Magnesium Oxide 420 (252 Mg) MG TABS Take 420 mg by mouth 2 (two)  times daily.    [provider]  metFORMIN (GLUCOPHAGE-XR) 500 MG 24 hr tablet Take 1,000 mg by mouth 2 (two) times daily with a meal.    [provider]  oxyCODONE-acetaminophen (PERCOCET) 10-325 MG tablet Take 1 tablet by mouth every 6 (six) hours as needed for pain.    [provider]  OXYGEN Inhale 3 L into the lungs continuous.     [provider]  potassium chloride SA (K-DUR,KLOR-CON) 20 MEQ tablet Take 2 tablets (40 mEq total) by mouth every morning AND 1 tablet (20 mEq total) at bedtime. 11/26/18   Amin, Jeanella Flattery, MD  rivaroxaban (XARELTO) 20 MG TABS tablet Take 1 tablet (20 mg total) by  mouth daily with supper. Patient taking differently: Take 20 mg by mouth daily at 6 PM.  01/20/17   Jettie Booze, MD  rosuvastatin (CRESTOR) 20 MG tablet Take 1 tablet (20 mg total) by mouth daily. 05/07/18   Matilde Haymaker, MD  spironolactone (ALDACTONE) 25 MG tablet Take 1 tablet (25 mg total) by mouth at bedtime. 02/04/18 02/04/19  Shirley Friar, PA-C  torsemide (DEMADEX) 20 MG tablet Take 4 tablets (80 mg total) by mouth 2 (two) times daily for 30 days. 11/26/18 12/26/18  Damita Lack, MD        [name], M.D. PGY1 Pager [phone] 12/01/2018 9:02 AM

## 2018-12-01 NOTE — Sedation Documentation (Signed)
7.75m ETT placed on first attempt. Placement confirmed by color-change capnometry and auscultation of lung sounds without bowel sounds.

## 2018-12-01 NOTE — H&P (Signed)
NAME:  Noel Rodier, MRN:  [id number], DOB:  [birth year], LOS: 0 ADMISSION DATE:  11/30/2018, CONSULTATION DATE:  1/29 REFERRING MD:  Dr. Randal Buba, CHIEF COMPLAINT:  AMS   Brief History   73 year old male intubated in ED for unresponsiveness. Admitted for mixed respiratory failure due to Pulm edema/COPD/OSA  History of present illness   73 year old male with PMH as below, which is significant for Atrial fibrillation on Xarelto, COPD on 3L (FEV1 40% pred), HFrEF (EF 40-45%), DM, and HTN. He has had recent sleep study with concern for OSA/OHS, but has not resulted yet. He has several recent admission for CHF exacerbation. He was in his usual state of health until 1/28 when his home PT checked his BP and found it to be low with SBP in the 90's. He felt fine, and they had a doctors appointment 1/29 so they figured they would hold off until then, however, later that night he began to feel very weak and EMS was called.   Upon arrival to the ED he was lethargic. CXR was concerning for pulmonary edema and he was started on BiPAP after receiving 59m of lasix. Laboratory evaluation remarkable for K 6.7, Creatinine 3.48 (baseline 1), BNP 1700, WBC 11.2, Hgb 9.4. K was temporized with insulin/dextrose, bicarb, and calcium. Notably he did not respond to narcan. Mental status worsened and he was ultimately intubated for airway protection. PCCM asked to admit.   Past Medical History   has a past medical history of Atrial flutter ([place], CAD (coronary artery disease), Chronic pain, Chronic systolic CHF (congestive heart failure) ([place], COPD (chronic obstructive pulmonary disease) ([place], Diabetes mellitus without complication ([place], Hypercholesteremia, Hypertension, NICM (nonischemic cardiomyopathy) ([place], PAF (paroxysmal atrial fibrillation) ([place], Pulmonary hypertension ([facility], and Tobacco abuse.   Significant Hospital Events     Consults:    Procedures:  ETT 1/29 >  Significant Diagnostic Tests:    Micro  Data:    Antimicrobials:     Interim history/subjective:    Objective   Blood pressure 111/79, pulse 61, temperature 98.5 F (36.9 C), resp. rate (!) 22, height 6' (1.829 m), weight 56 kg, SpO2 100 %.    Vent Mode: PRVC FiO2 (%):  [60 %] 60 % Set Rate:  [22 bmp] 22 bmp Vt Set:  [620 mL] 620 mL PEEP:  [5 cmH20] 5 cmH20 Plateau Pressure:  [27 cmH20] 27 cmH20   Intake/Output Summary (Last 24 hours) at 12/01/2018 0259 Last data filed at 12/01/2018 0229 Gross per 24 hour  Intake 12.51 ml  Output -  Net 12.51 ml   Filed Weights   12/01/18 0009  Weight: 56 kg    Examination: General: Morbidly obese male on vent HENT: [place]/AT, PERRL Lungs: Coarse bilateral Cardiovascular: RRR, no MRG Abdomen: Soft, non-tender Extremities: No acute deformity or ROM limitation. 2+ pitting edema to bilaterallower legs.  Neuro: Awake on vent, communicating appropriately.  GU: condom cath empty drainage bag.   Resolved Hospital Problem list     Assessment & Plan:   Acute on chronic hypercarbic and hypoxemic respiratory failure: Multifactorial. He has known HFrEF and appears volume overloaded on exam. He has severe COPD on home O2 (3L) followed by Dr. WMelvyn Novas Decompensated OSA/OHS also likely playing a role.  - Full vent support - Propofol and fentanyl for RASS 0 to -1 - ABG pending - VAP bundle - Diuresis as below.   - [facility] - Scheduled nebs - Defer systemic steroids - Possibly can be  extubated 1/29 as he has recovered his mental status rather quickly after intubation. Perhaps extubate to BiPAP. - Needs to quit smoking.  Acute on chronic HFrEF: Echo 04/07/18: EF 40-45%, PA peak pressure 62 mmHg. Discharged 1/29 with weight 270 lbs. Followed by advanced heart failure service.  - Diuresis as tolerated. Will order 46m additional lasix.  - Has been managed by advanced heart failure service. Would consult them 1/29 AM - Holding home spironolactone, torsemide, lasix. Will  diurese with IV. Hold home lisinopril.  - Continue home bisoprolol.   Paroxysmal AF New T wave inversion on ECG  - repeat ECG - Holding Xarelto, if unable to be extubated in the morning may want to start a heparin infusion.  - Continue amiodarone, bisoprolol  - Trending troponin  AKI likely in the setting of volume overload and CHF exacerbation - Diurese and repeat BMP  Hyperkalemia. He is on home supplementation now with AKI - temporizing measures in ED - Further diuresis - Repeat BMP in 2 hours. May need Kayexalate.  - Repeat ECG  DM - hold home metformin - SSI  Best practice:  Diet: NPO Pain/Anxiety/Delirium protocol (if indicated): Fentanyl and Propofol VAP protocol (if indicated): per protocol DVT prophylaxis: Xarelto, may need heparin infusion if not able to extubate. GI prophylaxis: PPI Glucose control: SSI Mobility: BR Code Status: Full Family Communication: patient and wife updated in ED Disposition: ICU  Labs   CBC: Recent Labs  Lab 11/24/18 0440 12/01/18 0011 12/01/18 0052  WBC 9.8 11.3*  --   NEUTROABS  --  9.7*  --   HGB 9.4* 9.4* 11.2*  HCT 32.1* 32.5* 33.0*  MCV 85.8 90.5  --   PLT 428* 328  --     Basic Metabolic Panel: Recent Labs  Lab 11/24/18 0440 11/25/18 0408 11/26/18 0839 12/01/18 0011 12/01/18 0052  NA 141 139 134* 132* 129*  K 3.2* 3.0* 4.3 6.7* 5.9*  5.7*  CL 85* 90* 92* 93*  --   CO2 43* 40* 34* 29  --   GLUCOSE 211* 162* 254* 83  --   BUN 25* 26* 33* 94*  --   CREATININE 1.00 1.12 1.06 3.48*  --   CALCIUM 8.4* 8.3* 8.1* 8.1*  --   MG 1.9  --  2.3  --   --    GFR: Estimated Creatinine Clearance: 15.6 mL/min (A) (by C-G formula based on SCr of 3.48 mg/dL (H)). Recent Labs  Lab 11/24/18 0440 12/01/18 0011  WBC 9.8 11.3*    Liver Function Tests: No results for input(s): AST, ALT, ALKPHOS, BILITOT, PROT, ALBUMIN in the last 168 hours. No results for input(s): LIPASE, AMYLASE in the last 168 hours. No results for  input(s): AMMONIA in the last 168 hours.  ABG    Component Value Date/Time   PHART 7.288 (L) 12/01/2018 0052   PCO2ART 64.2 (H) 12/01/2018 0052   PO2ART 131.0 (H) 12/01/2018 0052   HCO3 30.7 (H) 12/01/2018 0052   TCO2 33 (H) 12/01/2018 0052   ACIDBASEDEF 2.0 02/19/2015 1003   O2SAT 98.0 12/01/2018 0052     Coagulation Profile: No results for input(s): INR, PROTIME in the last 168 hours.  Cardiac Enzymes: No results for input(s): CKTOTAL, CKMB, CKMBINDEX, TROPONINI in the last 168 hours.  HbA1C: Hgb A1c MFr Bld  Date/Time Value Ref Range Status  11/13/2018 06:02 AM 6.5 (H) 4.8 - 5.6 % Final    Comment:    (NOTE) Pre diabetes:  5.7%-6.4% Diabetes:              >6.4% Glycemic control for   <7.0% adults with diabetes   05/01/2018 05:10 AM 8.1 (H) 4.8 - 5.6 % Final    Comment:    (NOTE) Pre diabetes:          5.7%-6.4% Diabetes:              >6.4% Glycemic control for   <7.0% adults with diabetes     CBG: Recent Labs  Lab 11/25/18 1618 11/25/18 2135 11/26/18 0824 12/01/18 0013 12/01/18 0205  GLUCAP 246* 240* 224* 76 119*    Review of Systems:   Unable as patient is intubated.  Past Medical History  He,  has a past medical history of Atrial flutter ([place]), CAD (coronary artery disease), Chronic pain, Chronic systolic CHF (congestive heart failure) ([place]), COPD (chronic obstructive pulmonary disease) ([place]), Diabetes mellitus without complication ([place]), Hypercholesteremia, Hypertension, NICM (nonischemic cardiomyopathy) ([place]), PAF (paroxysmal atrial fibrillation) ([place]), Pulmonary hypertension ([place]), and Tobacco abuse.   Surgical History    Past Surgical History:  Procedure Laterality Date  . CARDIOVERSION N/A 07/30/2018   Procedure: CARDIOVERSION;  Surgeon: Larey Dresser, MD;  Location: [facility] ENDOSCOPY;  Service: Cardiovascular;  Laterality: N/A;  . LEFT AND RIGHT HEART CATHETERIZATION WITH CORONARY ANGIOGRAM N/A 11/06/2014   Procedure: LEFT AND RIGHT HEART  CATHETERIZATION WITH CORONARY ANGIOGRAM;  Surgeon: Jettie Booze, MD;  Location: [facility] CATH LAB;  Service: Cardiovascular;  Laterality: N/A;  . SPLENECTOMY       Social History   reports that he has been smoking cigarettes. He has a 34.00 pack-year smoking history. He has never used smokeless tobacco. He reports that he does not drink alcohol or use drugs.   Family History   His family history includes Heart disease in his father and mother; Heart failure in his mother; Hypertension in his mother and sister. There is no history of Heart attack or Stroke.   Allergies No Known Allergies   Home Medications  Prior to Admission medications   Medication Sig Start Date End Date Taking? Authorizing Provider  albuterol (PROVENTIL HFA;VENTOLIN HFA) 108 (90 BASE) MCG/ACT inhaler Inhale 2 puffs into the lungs every 6 (six) hours as needed for wheezing or shortness of breath. Patient not taking: Reported on 11/14/2018 11/07/14   Kinnie Feil, MD  bisoprolol (ZEBETA) 5 MG tablet Take 0.5 tablets (2.5 mg total) by mouth daily for 30 days. 11/26/18 12/26/18  Amin, Jeanella Flattery, MD  budesonide-formoterol (SYMBICORT) 160-4.5 MCG/ACT inhaler Inhale 2 puffs into the lungs 2 (two) times daily.    [provider]  diazepam (VALIUM) 10 MG tablet Take 0.5 tablets (5 mg total) by mouth daily as needed for anxiety or sleep. Patient taking differently: Take 10 mg by mouth daily as needed for anxiety or sleep.  12/19/16   Theodis Blaze, MD  ferrous sulfate 325 (65 FE) MG tablet Take 325 mg by mouth 3 (three) times daily as needed (for supplementation).    [provider]  fluticasone (FLONASE) 50 MCG/ACT nasal spray Place 2 sprays into both nostrils daily as needed for allergies.     [provider]  folic acid (FOLVITE) 1 MG tablet Take 1 mg by mouth daily.    [provider]  gabapentin (NEURONTIN) 600 MG tablet Take 1 tablet (600 mg total) by mouth 2 (two) times daily. 12/19/16    Theodis Blaze, MD  glucose 4 GM chewable tablet Sarina Ser  4 tablets by mouth See admin instructions. Chew 4 tablets by mouth as needed for low blood sugar - repeat every 15 minutes if blood sugar less than 70    [provider]  guaifenesin (HUMIBID E) 400 MG TABS tablet Take 1 tablet (400 mg total) by mouth every 6 (six) hours as needed. Patient taking differently: Take 400 mg by mouth every 6 (six) hours as needed (cough).  08/02/18   Regalado, Belkys A, MD  hydrocortisone (ANUSOL-HC) 2.5 % rectal cream Place 1 application rectally 2 (two) times daily as needed for hemorrhoids or itching.     [provider]  hydrocortisone 1 % lotion Apply 1 application topically See admin instructions. Apply small amount to back twice daily for itchy rash    [provider]  insulin aspart protamine - aspart (NOVOLOG MIX 70/30 FLEXPEN) (70-30) 100 UNIT/ML FlexPen Inject 0.05 mLs (5 Units total) into the skin 2 (two) times daily. Patient taking differently: Inject 30 Units into the skin 2 (two) times daily.  08/02/18   Regalado, Belkys A, MD  ipratropium-albuterol (DUONEB) 0.5-2.5 (3) MG/3ML SOLN Take 3 mLs by nebulization 2 (two) times daily.    [provider]  latanoprost (XALATAN) 0.005 % ophthalmic solution Place 1 drop into both eyes at bedtime.    [provider]  lisinopril (PRINIVIL,ZESTRIL) 10 MG tablet Take 1 tablet (10 mg total) by mouth daily for 30 days. 11/27/18 12/27/18  Damita Lack, MD  Magnesium Oxide 420 (252 Mg) MG TABS Take 420 mg by mouth 2 (two) times daily.    [provider]  metFORMIN (GLUCOPHAGE-XR) 500 MG 24 hr tablet Take 1,000 mg by mouth 2 (two) times daily with a meal.    [provider]  oxyCODONE-acetaminophen (PERCOCET) 10-325 MG tablet Take 1 tablet by mouth every 6 (six) hours as needed for pain.    [provider]  OXYGEN Inhale 3 L into the lungs continuous.     [provider]  potassium chloride  SA (K-DUR,KLOR-CON) 20 MEQ tablet Take 2 tablets (40 mEq total) by mouth every morning AND 1 tablet (20 mEq total) at bedtime. 11/26/18   Amin, Jeanella Flattery, MD  rivaroxaban (XARELTO) 20 MG TABS tablet Take 1 tablet (20 mg total) by mouth daily with supper. Patient taking differently: Take 20 mg by mouth daily at 6 PM.  01/20/17   Jettie Booze, MD  rosuvastatin (CRESTOR) 20 MG tablet Take 1 tablet (20 mg total) by mouth daily. 05/07/18   Matilde Haymaker, MD  spironolactone (ALDACTONE) 25 MG tablet Take 1 tablet (25 mg total) by mouth at bedtime. 02/04/18 02/04/19  Shirley Friar, PA-C  torsemide (DEMADEX) 20 MG tablet Take 4 tablets (80 mg total) by mouth 2 (two) times daily for 30 days. 11/26/18 12/26/18  Damita Lack, MD     Critical care time: 23 mins     Georgann Housekeeper, AGACNP-BC [place] Pager [phone] or [phone]  12/01/2018 4:04 AM

## 2018-12-01 NOTE — ED Notes (Signed)
Upon reassessment patient became verbally unresponsive even after sternal rubs. MD and respiratory at bedside and decision was made to intubate. Report given to other accepting nurse and patient moved into resus. Room in order to intubate. All questions answered

## 2018-12-01 NOTE — Care Management Note (Signed)
Case Management Note Manya Silvas RN MSN CCM Transitions of Care [address] [phone]  Patient Details  Name: Nicholas Caldwell MRN: [id number] Date of Birth: 1945/11/26  Subjective/Objective:     Acute respiratory failure with hypoxia and hypercapnia             Action/Plan: 12/01/2017-PTA home with spouse and [facility] services (RN, PT-[facility]). Home O2 at 3 l [facility]). PCP-Dr. [name] [place]). CM notified [facility] and Dr. Marjo Bicker of pt readmission. Patient is also followed by HF clinic. Currently intubated and on levophed and fentanyl. Will follow for transition of care needs.   Expected Discharge Date:                  Expected Discharge Plan:  [place]  In-House Referral:  Clinical Social Work  Discharge planning Services  CM Consult  Post Acute Care Choice:    Choice offered to:     DME Arranged:    DME Agency:     HH Arranged:    [place] Agency:     Status of Service:  In process, will continue to follow  If discussed at Long Length of Stay Meetings, dates discussed:    Additional Comments:  Bartholomew Crews, RN 12/01/2018, 1:15 PM

## 2018-12-01 NOTE — Procedures (Signed)
.  Central Venous Catheter Insertion Procedure Note Nicholas Caldwell [id number] [birth year]  Procedure: Insertion of Central Venous Catheter Indications: Assessment of intravascular volume  Procedure Details Consent: Risks of procedure as well as the alternatives and risks of each were explained to the (patient/caregiver).  Consent for procedure obtained. Time Out: Verified patient identification, verified procedure, site/side was marked, verified correct patient position, special equipment/implants available, medications/allergies/relevent history reviewed, required imaging and test results available.  Performed  Maximum sterile technique was used including antiseptics, cap, gloves, gown, hand hygiene, mask and sheet. Skin prep: Chlorhexidine; local anesthetic administered A antimicrobial bonded/coated triple lumen catheter was placed in the right internal jugular vein using the Seldinger technique.  Evaluation Blood flow good Complications: No apparent complications Patient did tolerate procedure well. Chest X-ray ordered to verify placement.  CXR: pending.  Nicholas Caldwell 12/01/2018, 6:34 PM

## 2018-12-01 NOTE — Progress Notes (Addendum)
ANTICOAGULATION CONSULT NOTE - Initial Consult  Pharmacy Consult for Heparin Indication: chest pain/ACS, suspected NSTEMI  No Known Allergies  Patient Measurements: Height: 6' (182.9 cm) Weight: 279 lb 15.8 oz (127 kg) IBW/kg (Calculated) : 77.6 Heparin Dosing Weight: 106  Vital Signs: Temp: 99.1 F (37.3 C) (01/29 0718) Temp Source: Oral (01/29 0718) BP: 147/61 (01/29 1030) Pulse Rate: 64 (01/29 1030)  Labs: Recent Labs    12/01/18 0011 12/01/18 0052 12/01/18 0323 12/01/18 0408  HGB 9.4* 11.2* 10.9*  --   HCT 32.5* 33.0* 32.0*  --   PLT 328  --   --   --   CREATININE 3.48*  --   --  3.15*  TROPONINI  --   --   --  0.36*    Estimated Creatinine Clearance: 30.1 mL/min (A) (by C-G formula based on SCr of 3.15 mg/dL (H)).   Medical History: Past Medical History:  Diagnosis Date  . Atrial flutter ([place])    a. recurrent AFlutter with RVR;  b. Amiodarone Rx started 4/16  . CAD (coronary artery disease)    a. LHC 1/16:  mLAD diffuse disease, pLCx mild disease, dLCx with disease but too small for PCI, RCA ok, EF 25-30%  . Chronic pain   . Chronic systolic CHF (congestive heart failure) ([place])   . COPD (chronic obstructive pulmonary disease) ([place])   . Diabetes mellitus without complication ([place])   . Hypercholesteremia   . Hypertension   . NICM (nonischemic cardiomyopathy) ([place])    a.dx 2016. b. 2D echo 06/2016 - Last echo 07/01/16: mod dilated LV, mod LVH, EF 25-30%, mild-mod MR, sev LAE, mild-mod reduced RV systolic function, mild-mod TR, PASP 59mHG.  .[place] KitchenPAF (paroxysmal atrial fibrillation) (H[facility])    On amio - ot a candidate for flecainide due to cardiomyopathy, not a candidate for Tikosyn due to prolonged QT, and felt to be a poor candidate for ablation given left atrial size.  . Pulmonary hypertension ([place]   . Tobacco abuse     Medications:  Scheduled:  . budesonide (PULMICORT) nebulizer solution  0.5 mg Nebulization BID  . chlorhexidine gluconate (MEDLINE KIT)  15  mL Mouth Rinse BID  . heparin  5,000 Units Subcutaneous Q8H  . insulin aspart  0-15 Units Subcutaneous Q4H  . ipratropium-albuterol  3 mL Nebulization Q6H  . mouth rinse  15 mL Mouth Rinse 10 times per day  . pantoprazole (PROTONIX) IV  40 mg Intravenous QHS  . rosuvastatin  20 mg Per Tube Daily  . sodium zirconium cyclosilicate  5 g Oral STAT  . sterile water (preservative free)        Assessment: 73yo with hx of Afib (on xarleto PTA, last dose PTA ~1800 on 16/19, COPD, systolic CHF, chronic pain. Presented with lethargy, hypotension and bradycardia. Patient was intubated for airway protection / acute hypoxic / hypercarbic respiratory failure.  Patients tropoinin elevated at 0.32 > 0.36. Hgb slightly low at 10.9, Hct 32, and platelets wnl at 328.  Goal of Therapy:  Heparin level 0.3-0.7 units/ml aPTT 66-102 seconds Monitor platelets by anticoagulation protocol: Yes   Plan:  Baseline HL at 1130 today Start heparin infusion at 1250 units/hr at 1800; no bolus. Check aPTT / HL 8 hours later at 0200 on 1/30 Daily aPTT and HL until correlated.  Monitor CBC   ADDENDUM:  - Baseline Anti-XA level came back at 0.72 - Central Line Procedure to be placed today ~1630 - CCM okay with starting heparin at originally planned  time of 1800.  ADDENDUM 2: - Heparin start pushed back to 2000.   Thank you for allowing pharmacy to be a part of this patient's care.  Tamela Gammon, PharmD 12/01/2018 10:58 AM PGY-1 Pharmacy Resident Direct Phone: [phone] Please check [email or web address] for unit-specific pharmacist phone numbers

## 2018-12-01 NOTE — Consult Note (Addendum)
Advanced Heart Failure Team Consult Note   Primary Physician: [facility] PCP-Cardiologist:  No primary care provider on file.  Reason for Consultation: Acute on chronic systolic CHF   HPI:    Nicholas Caldwell is seen today for evaluation of acute on chronic systolic CHF in the setting of ? sepsis at the request of Dr Halford Chessman.   Nicholas Caldwell is a 73 y.o. male with PMH of chronic systolic CHF/nonischemic cardiomyopathy Echo 12/13/2016 LVEF 40-45%, COPD on home oxygen, PAFs/p DCCV 2018,and history of non compliance.  Admitted 1/19 - 11/26/2018 with BLE edema and wheezing. Treated for COPD exacerbation and diuresed with IV lasix. Cr 1.06 on day of discharge.   He presented back to [facility] overnight 12/01/2018 with lethargy and reported tremors. Noted to be hypotensive on admission. Labs significant for hyperkalemia and acute renal failure.  He did not respond to Narcan. He was intubated for airway protection.  Pertinent labs on admission include K 6.7, Cr 3.48 up from 1.06, WBC 11.3, Hgb 9.4, BNP 1667, and Troponin I, poc 0.32.   Pt remains intubated and sedated. 1.5 L of UOP recorded with 40 mg IV lasix x 2. BP 120s with MAP in 70s on levophed @ 8 mcg. Stirs to questioning and follows commands.   Cr down to 3.1 today. K remains elevated at 5.4. WBC 11.3. PCT < 0.10.  UA with moderate Hgb and "few" bacteria.   Echo 04/07/2018 LVEF 40-45%, Mild LAE, Mild RV dilation, PA peak pressure 62 mm Hg.   ROS: Patient is encephalopathic and/or intubated. History has been obtained from chart review.    Home Medications Prior to Admission medications   Medication Sig Start Date End Date Taking? Authorizing Provider  bisoprolol (ZEBETA) 5 MG tablet Take 0.5 tablets (2.5 mg total) by mouth daily for 30 days. 11/26/18 12/26/18 Yes Amin, Jeanella Flattery, MD  budesonide-formoterol (SYMBICORT) 160-4.5 MCG/ACT inhaler Inhale 2 puffs into the lungs 2 (two) times daily.   Yes [provider]    diazepam (VALIUM) 10 MG tablet Take 0.5 tablets (5 mg total) by mouth daily as needed for anxiety or sleep. Patient taking differently: Take 10 mg by mouth daily as needed for anxiety or sleep.  12/19/16  Yes Theodis Blaze, MD  ferrous sulfate 325 (65 FE) MG tablet Take 325 mg by mouth 3 (three) times daily as needed (for supplementation).   Yes [provider]  fluticasone (FLONASE) 50 MCG/ACT nasal spray Place 2 sprays into both nostrils daily as needed for allergies.    Yes [provider]  folic acid (FOLVITE) 1 MG tablet Take 1 mg by mouth daily.   Yes [provider]  gabapentin (NEURONTIN) 600 MG tablet Take 1 tablet (600 mg total) by mouth 2 (two) times daily. 12/19/16  Yes Theodis Blaze, MD  glucose 4 GM chewable tablet Chew 4 tablets by mouth See admin instructions. Chew 4 tablets by mouth as needed for low blood sugar - repeat every 15 minutes if blood sugar less than 70   Yes [provider]  guaifenesin (HUMIBID E) 400 MG TABS tablet Take 1 tablet (400 mg total) by mouth every 6 (six) hours as needed. Patient taking differently: Take 400 mg by mouth every 6 (six) hours as needed (cough).  08/02/18  Yes Regalado, Belkys A, MD  hydrocortisone (ANUSOL-HC) 2.5 % rectal cream Place 1 application rectally 2 (two) times daily as needed for hemorrhoids or itching.    Yes [provider]  hydrocortisone 1 % lotion Apply 1 application topically See admin instructions. Apply small amount to back twice daily for itchy rash   Yes [provider]  insulin aspart protamine - aspart (NOVOLOG MIX 70/30 FLEXPEN) (70-30) 100 UNIT/ML FlexPen Inject 0.05 mLs (5 Units total) into the skin 2 (two) times daily. Patient taking differently: Inject 30 Units into the skin 2 (two) times daily.  08/02/18  Yes Regalado, Belkys A, MD  ipratropium-albuterol (DUONEB) 0.5-2.5 (3) MG/3ML SOLN Take 3 mLs by nebulization 2 (two) times daily.   Yes [provider]   latanoprost (XALATAN) 0.005 % ophthalmic solution Place 1 drop into both eyes at bedtime.   Yes [provider]  lisinopril (PRINIVIL,ZESTRIL) 10 MG tablet Take 1 tablet (10 mg total) by mouth daily for 30 days. 11/27/18 12/27/18 Yes Amin, Jeanella Flattery, MD  Magnesium Oxide 420 (252 Mg) MG TABS Take 420 mg by mouth 2 (two) times daily.   Yes [provider]  metFORMIN (GLUCOPHAGE-XR) 500 MG 24 hr tablet Take 1,000 mg by mouth 2 (two) times daily with a meal.   Yes [provider]  oxyCODONE-acetaminophen (PERCOCET) 10-325 MG tablet Take 1 tablet by mouth every 6 (six) hours as needed for pain.   Yes [provider]  OXYGEN Inhale 3 L into the lungs continuous.    Yes [provider]  potassium chloride SA (K-DUR,KLOR-CON) 20 MEQ tablet Take 2 tablets (40 mEq total) by mouth every morning AND 1 tablet (20 mEq total) at bedtime. Patient taking differently: Take 2 tablets (40 mEq total) by mouth every morning  AND 1 tablet (20 mEq total) at bedtime. 11/26/18  Yes Amin, Jeanella Flattery, MD  rivaroxaban (XARELTO) 20 MG TABS tablet Take 1 tablet (20 mg total) by mouth daily with supper. Patient taking differently: Take 20 mg by mouth daily at 6 PM.  01/20/17  Yes Jettie Booze, MD  rosuvastatin (CRESTOR) 20 MG tablet Take 1 tablet (20 mg total) by mouth daily. 05/07/18  Yes Matilde Haymaker, MD  spironolactone (ALDACTONE) 25 MG tablet Take 1 tablet (25 mg total) by mouth at bedtime. 02/04/18 02/04/19 Yes Tillery, Satira Mccallum, PA-C  torsemide (DEMADEX) 20 MG tablet Take 4 tablets (80 mg total) by mouth 2 (two) times daily for 30 days. 11/26/18 12/26/18 Yes Damita Lack, MD    Past Medical History: Past Medical History:  Diagnosis Date  . Atrial flutter ([place])    a. recurrent AFlutter with RVR;  b. Amiodarone Rx started 4/16  . CAD (coronary artery disease)    a. LHC 1/16:  mLAD diffuse disease, pLCx mild disease, dLCx with disease but too small for PCI, RCA ok,  EF 25-30%  . Chronic pain   . Chronic systolic CHF (congestive heart failure) ([place])   . COPD (chronic obstructive pulmonary disease) ([place])   . Diabetes mellitus without complication ([place])   . Hypercholesteremia   . Hypertension   . NICM (nonischemic cardiomyopathy) ([place])    a.dx 2016. b. 2D echo 06/2016 - Last echo 07/01/16: mod dilated LV, mod LVH, EF 25-30%, mild-mod MR, sev LAE, mild-mod reduced RV systolic function, mild-mod TR, PASP 55mHG.  .Marland KitchenPAF (paroxysmal atrial fibrillation) (HCC)    On amio - ot a candidate for flecainide due to cardiomyopathy, not a candidate for Tikosyn due to prolonged QT, and felt to be a poor candidate for ablation given left atrial size.  . [place]monary hypertension [place]   . Tobacco abuse  Past Surgical History: Past Surgical History:  Procedure Laterality Date  . CARDIOVERSION N/A 07/30/2018   Procedure: CARDIOVERSION;  Surgeon: Larey Dresser, MD;  [facility] ENDOSCOPY;  Service: Cardiovascular;  Laterality: N/A;  . LEFT AND RIGHT HEART CATHETERIZATION WITH CORONARY ANGIOGRAM N/A 11/06/2014   Procedure: LEFT AND RIGHT HEART CATHETERIZATION WITH CORONARY ANGIOGRAM;  Surgeon: Jettie Booze, MD;  Location[facility] C[facility] LAB;  Service: Cardiovascular;  Laterality: N/A;  . SPLENECTOMY      Family History: Family History  Problem Relation Age of Onset  . Heart disease Mother   . Hypertension Mother   . Heart failure Mother   . Heart disease Father   . Hypertension Sister   . Heart attack Neg Hx   . Stroke Neg Hx     Social History: Social History   Socioeconomic History  . Marital status: Married    Spouse name: Not on file  . Number of children: Not on file  . Years of education: Not on file  . Highest education level: Not on file  Occupational History  . Not on file  Social Needs  . Financial resource strain: Not on file  . Food insecurity:    Worry: Not on file    Inability: Not on file  . Transportation needs:    Medical: Not on  file    Non-medical: Not on file  Tobacco Use  . Smoking status: Current Every Day Smoker    Packs/day: 1.00    Years: 34.00    Pack years: 34.00    Types: Cigarettes  . Smokeless tobacco: Never Used  Substance and Sexual Activity  . Alcohol use: No    Alcohol/week: 0.0 standard drinks  . Drug use: No  . Sexual activity: Not on file  Lifestyle  . Physical activity:    Days per week: Not on file    Minutes per session: Not on file  . Stress: Not on file  Relationships  . Social connections:    Talks on phone: Not on file    Gets together: Not on file    Attends religious service: Not on file    Active member of club or organization: Not on file    Attends meetings of clubs or organizations: Not on file    Relationship status: Not on file  Other Topics Concern  . Not on file  Social History Narrative  . Not on file    Allergies:  No Known Allergies  Objective:    Vital Signs:   Temp:  [98.5 F (36.9 C)-99.1 F (37.3 C)] 99.1 F (37.3 C) (01/29 1124) Pulse Rate:  [53-89] 62 (01/29 1230) Resp:  [14-26] 25 (01/29 1230) BP: (73-153)/(34-79) 118/47 (01/29 1230) SpO2:  [89 %-100 %] 97 % (01/29 1230) FiO2 (%):  [40 %-60 %] 40 % (01/29 1130) Weight:  [56 kg-127 kg] 127 kg (01/29 0416) Last BM Date: (pTA)  Weight change: Filed Weights   12/01/18 0009 12/01/18 0416  Weight: 56 kg 127 kg   Intake/Output:   Intake/Output Summary (Last 24 hours) at 12/01/2018 1314 Last data filed at 12/01/2018 1200 Gross per 24 hour  Intake 537.93 ml  Output 2050 ml  Net -1512.07 ml    Physical Exam    General: Intubated/sedated.  HEENT: + ETT Neck: Supple. JVP to jaw. Carotids 2+ bilat; no bruits. No thyromegaly or nodule noted. Cor: PMI nondisplaced. RRR, No M/G/R noted Lungs: Diminished basilar sounds Abdomen: Soft, non-tender, non-distended, no HSM. No bruits or  masses. +BS  Extremities: No cyanosis, clubbing, or rash. 1-2+ edema in thighs.   Neuro: Intubated/sedated    Telemetry   NSR 60-70s, personally reviewed  EKG    Personally reviewed. Sinus brady 53 bpm, IVCD. ST depression V2-V5, more prominent in the past.   Labs   Basic Metabolic Panel: Recent Labs  Lab 11/25/18 0408 11/26/18 0839 12/01/18 0011 12/01/18 0052 12/01/18 0323 12/01/18 0408  NA 139 134* 132* 129* 132* 135  K 3.0* 4.3 6.7* 5.9*  5.7* 5.2* 5.4*  CL 90* 92* 93*  --   --  90*  CO2 40* 34* 29  --   --  26  GLUCOSE 162* 254* 83  --   --  78  BUN 26* 33* 94*  --   --  96*  CREATININE 1.12 1.06 3.48*  --   --  3.15*  CALCIUM 8.3* 8.1* 8.1*  --   --  8.8*  MG  --  2.3  --   --   --  2.9*  PHOS  --   --   --   --   --  5.5*   Liver Function Tests: No results for input(s): AST, ALT, ALKPHOS, BILITOT, PROT, ALBUMIN in the last 168 hours. No results for input(s): LIPASE, AMYLASE in the last 168 hours. No results for input(s): AMMONIA in the last 168 hours.  CBC: Recent Labs  Lab 12/01/18 0011 12/01/18 0052 12/01/18 0323  WBC 11.3*  --   --   NEUTROABS 9.7*  --   --   HGB 9.4* 11.2* 10.9*  HCT 32.5* 33.0* 32.0*  MCV 90.5  --   --   PLT 328  --   --    Cardiac Enzymes: Recent Labs  Lab 12/01/18 0408  TROPONINI 0.36*   BNP: BNP (last 3 results) Recent Labs    11/16/18 0237 11/21/18 2238 12/01/18 0015  BNP 606.1* 1,739.7* 1,666.7*   ProBNP (last 3 results) No results for input(s): PROBNP in the last 8760 hours.  CBG: Recent Labs  Lab 12/01/18 0205 12/01/18 0409 12/01/18 0450 12/01/18 0720 12/01/18 1126  GLUCAP 119* 74 75 88 135*   Coagulation Studies: No results for input(s): LABPROT, INR in the last 72 hours.  Imaging   Dg Chest Portable 1 View  Result Date: 12/01/2018 CLINICAL DATA:  Intubation and OG tube placement. EXAM: PORTABLE CHEST 1 VIEW COMPARISON:  Radiograph earlier this day FINDINGS: Endotracheal tube tip 5.5 cm from the carina. Enteric tube tip below the diaphragm, side-port not well visualized. Again seen cardiomegaly,  vascular congestion and pulmonary edema. Retrocardiac opacity may be overlapping soft tissue attenuation or pleural effusion. IMPRESSION: 1. Endotracheal tube 5.5 cm from the carina. Enteric tube tip below the diaphragm, side-port not well visualized. 2. Cardiomegaly, vascular congestion and pulmonary edema. Retrocardiac opacity may be overlapping soft tissue attenuation or pleural effusion. Electronically Signed   By: Keith Rake M.D.   On: 12/01/2018 01:27   Dg Chest Portable 1 View  Result Date: 12/01/2018 CLINICAL DATA:  Shortness of breath EXAM: PORTABLE CHEST 1 VIEW COMPARISON:  11/21/2018, 11/14/2018, 11/21/2018 FINDINGS: Non inclusion of the left base. Cardiomegaly with vascular congestion and moderate pulmonary edema. Suspected left pleural effusion. No pneumothorax. IMPRESSION: Similar appearance of cardiomegaly, vascular congestion, and moderate pulmonary edema with probable left pleural effusion Electronically Signed   By: Donavan Foil M.D.   On: 12/01/2018 00:34      Medications:     Current Medications: . budesonide (PULMICORT) nebulizer  solution  0.5 mg Nebulization BID  . chlorhexidine gluconate (MEDLINE KIT)  15 mL Mouth Rinse BID  . insulin aspart  0-15 Units Subcutaneous Q4H  . ipratropium-albuterol  3 mL Nebulization Q6H  . mouth rinse  15 mL Mouth Rinse 10 times per day  . pantoprazole (PROTONIX) IV  40 mg Intravenous QHS  . rosuvastatin  20 mg Per Tube Daily  . sodium zirconium cyclosilicate  5 g Oral STAT     Infusions: . sodium chloride    . fentaNYL infusion INTRAVENOUS 50 mcg/hr (12/01/18 1237)  . heparin    . norepinephrine (LEVOPHED) Adult infusion 8 mcg/min (12/01/18 1220)  . propofol (DIPRIVAN) infusion Stopped (12/01/18 1033)     Patient Profile   Nicholas Caldwell is a 73 y.o. male with PMH of chronic systolic CHF/nonischemic cardiomyopathy Echo 12/13/2016 LVEF 40-45%, COPD on home oxygen, PAFs/p DCCV 2018,and history of non  compliance.  Admitted overnight with acute respiratory failure, acute renal failure, and hypotension.   Assessment/Plan   1.Acute on chronic hypoxic Respiratory failure/COPD/?OSA - Recent PFTs with restrictive lung disease.  - Remains intubated and sedated.  - He is chronically on O2 3L. Had sleep study scheduled for 11/30/2018. Will need to reschedule if improves.  - Continues to smoke. Discussed cessation.  - Femains on solu-medrol and duonebs.Per primary team.  2.Acute on chronic systolic CHF  - Echo 01/09/64 40-45%, Grade 2 DD, Mod LAE, Mild RV, Mod RAE, PA peak pressure 56 mm Hg. - Echo 04/07/18: EF 40-45%, PA peak pressure 62 mmHg - Will repeat Echo.  - Volume status elevated on exam. Recommend central line placement to follow Coox/CVP.  - Hold spiro with ARF - Hold bisoprolol with hypotension.  -Hold lisinopril with ARF.  3. Encephalopathy - Reportedly took extra pain medicines prior to bed yesterday. Did not respond to narcan.  - Suspect most likely in setting of Acute respiratory failure and Acute renal failure.  4. ARF on CKD III - Cr from 1.0 recent discharge, up to 3.5.  - ? If over diuresis or other process. He is volume overloaded on exam, further complicating the picture.  5. Paroxysmal Afib - Remains in NSR currently.  - Covering with heparin now in case needs a procedure. Otherwise, has been on Xarelto 20 mg daily.  - Has previously failed amiodarone and poor tikosyn candidate with long QT. Now following with EP for possible long QT syndrome.  - Poor ablation candidate with LA size 6. Tobacco abuse  -Will encouraged complete cessation.  7. Mild nonobstructive CAD on LHC 2016 - Troponin mildly elevated at 0.3 -> 0.4. Suspect in setting of Acute renal failure/demand ischemia. Follow.  - Continue crestor 20 mg daily  - Heparin for now. Resume Xarelto once stabilized.  8. Heart block/bradycardia - Holding BB in setting of ARF and hypotension.  9. Hyperkalemia -  In setting of ARF. Follow.  10. NSVT - Watch electrolytes closely.   Patient is critically ill and in  multiple system organ failure.  We will continue to follow closely.   Medication concerns reviewed with patient and pharmacy team. Barriers identified: None at this time.   Length of Stay: 0  Annamaria Helling  12/01/2018, 1:14 PM  Advanced Heart Failure Team Pager [phone] (M-F; 7a - 4p)  Please contact [facility] for night-coverage after hours (4p -7a ) and weekends on [email or web address]  Patient seen with PA, agree with the above note.   He was recently in the hospital with  COPD exacerbation + CHF exacerbation.  He was treated with Solumedrol and IV Lasix and sent home with torsemide 80 mg bid (had been on Lasix 160 mg po bid prior to admission).  At discharge, creatinine was 1.   Patient came back to the ER 1/29 early am with lethargy/altered mental status.  He was hypotensive and noted to have AKI with creatinine up to 3.48. He had hypercarbic/hypoxemic respiratory failure.  There was some concern for overuse of sedating medications.  He was intubated.  CXR with pulmonary edema. ECG showed NSR, anterior TWIs more prominent than on prior ECG.   On exam, he is awake on the vent.  JVP 12-14 cm.  Decreased BS lung bases.  Regular S1S2.  2+ edema to knees.   1. Acute on chronic hypercarbic/hypoxemic respiratory failure: He is on home oxygen at baseline.  Patient was intubated at admission.  Initial lesion uncertain.  He does appear to be volume overloaded on exam currently with pulmonary edema on CXR, so this certainly plays a role.  Also concern for oversedation from home meds.  He is not actively wheezing currently so not sure how much COPD plays a role here.  - Vent weaning per CCM.  Will need eventual sleep study.  - Continue diuresis (see below).  2. Acute on chronic systolic CHF: Echo in 1/19 with EF 40-45%, mild RV dilation.  Nonischemic cardiomyopathy.  He was admitted with  hypotension, now on norepinephrine at 8.  Pulmonary edema on CXR and volume overloaded on exam.  Also with AKI at admission.  - Give an additional Lasix 80 mg IV x 1 this afternoon.  - Would place central line to follow CVP and co-ox.  - Hold lisinopril, spironolactone, bisoprolol.  - Would get echo.  3. AKI: Baseline creatinine 1.0, creatinine currently 3.15.  Suspect AKI due to initial hypotension.  - As above, hold lisinopril, spironolactone.  - With volume overload and stable creatinine, gently diurese.  4. Shock/hypotension: Presented with low BP, now on NE 8.  Doubt septic shock, no fever and WBCs near normal. May be related to sedative meds prior to admission and now with intubation.   - Wean norepinephrine as able.  - Probably not low output HF but will check co-ox when central line placed.  5. COPD: Per primary service.  Not actively wheezing.  6. Atrial fibrillation: Paroxysmal.  He is in NSR.  Xarelto on hold in case procedures needed.  7. Elevated troponin: Mild troponin elevation with no trend.  Suspect demand ischemia with volume overload/hypotension.   Loralie Champagne 12/01/2018 1:56 PM

## 2018-12-02 ENCOUNTER — Inpatient Hospital Stay (HOSPITAL_COMMUNITY): Payer: Medicare Other

## 2018-12-02 DIAGNOSIS — I340 Nonrheumatic mitral (valve) insufficiency: Secondary | ICD-10-CM

## 2018-12-02 DIAGNOSIS — I5023 Acute on chronic systolic (congestive) heart failure: Secondary | ICD-10-CM

## 2018-12-02 DIAGNOSIS — I361 Nonrheumatic tricuspid (valve) insufficiency: Secondary | ICD-10-CM

## 2018-12-02 LAB — ECHOCARDIOGRAM COMPLETE
Height: 72 in
Weight: 4306.91 oz

## 2018-12-02 LAB — CBC
HCT: 32.1 % — ABNORMAL LOW (ref 39.0–52.0)
HEMOGLOBIN: 10.1 g/dL — AB (ref 13.0–17.0)
MCH: 26 pg (ref 26.0–34.0)
MCHC: 31.5 g/dL (ref 30.0–36.0)
MCV: 82.5 fL (ref 80.0–100.0)
Platelets: 374 10*3/uL (ref 150–400)
RBC: 3.89 MIL/uL — ABNORMAL LOW (ref 4.22–5.81)
RDW: 17.1 % — ABNORMAL HIGH (ref 11.5–15.5)
WBC: 7 10*3/uL (ref 4.0–10.5)
nRBC: 0 % (ref 0.0–0.2)

## 2018-12-02 LAB — POCT I-STAT 7, (LYTES, BLD GAS, ICA,H+H)
Acid-Base Excess: 12 mmol/L — ABNORMAL HIGH (ref 0.0–2.0)
BICARBONATE: 35.8 mmol/L — AB (ref 20.0–28.0)
Calcium, Ion: 1.17 mmol/L (ref 1.15–1.40)
HCT: 32 % — ABNORMAL LOW (ref 39.0–52.0)
Hemoglobin: 10.9 g/dL — ABNORMAL LOW (ref 13.0–17.0)
O2 Saturation: 98 %
PO2 ART: 95 mmHg (ref 83.0–108.0)
Patient temperature: 100.4
Potassium: 4.8 mmol/L (ref 3.5–5.1)
Sodium: 138 mmol/L (ref 135–145)
TCO2: 37 mmol/L — ABNORMAL HIGH (ref 22–32)
pCO2 arterial: 43.4 mmHg (ref 32.0–48.0)
pH, Arterial: 7.527 — ABNORMAL HIGH (ref 7.350–7.450)

## 2018-12-02 LAB — BASIC METABOLIC PANEL
Anion gap: 14 (ref 5–15)
BUN: 80 mg/dL — ABNORMAL HIGH (ref 8–23)
CO2: 31 mmol/L (ref 22–32)
Calcium: 9 mg/dL (ref 8.9–10.3)
Chloride: 95 mmol/L — ABNORMAL LOW (ref 98–111)
Creatinine, Ser: 1.56 mg/dL — ABNORMAL HIGH (ref 0.61–1.24)
GFR calc Af Amer: 51 mL/min — ABNORMAL LOW (ref >60)
GFR calc non Af Amer: 44 mL/min — ABNORMAL LOW (ref >60)
Glucose, Bld: 167 mg/dL — ABNORMAL HIGH (ref 70–99)
Potassium: 4.7 mmol/L (ref 3.5–5.1)
Sodium: 140 mmol/L (ref 135–145)

## 2018-12-02 LAB — APTT
APTT: 84 s — AB (ref 24–36)
aPTT: 52 seconds — ABNORMAL HIGH (ref 24–36)

## 2018-12-02 LAB — GLUCOSE, CAPILLARY
GLUCOSE-CAPILLARY: 151 mg/dL — AB (ref 70–99)
GLUCOSE-CAPILLARY: 172 mg/dL — AB (ref 70–99)
Glucose-Capillary: 160 mg/dL — ABNORMAL HIGH (ref 70–99)
Glucose-Capillary: 148 mg/dL — ABNORMAL HIGH (ref 70–99)
Glucose-Capillary: 201 mg/dL — ABNORMAL HIGH (ref 70–99)
Glucose-Capillary: 162 mg/dL — ABNORMAL HIGH (ref 70–99)

## 2018-12-02 LAB — HEPARIN LEVEL (UNFRACTIONATED)
Heparin Unfractionated: 0.67 IU/mL (ref 0.30–0.70)
Heparin Unfractionated: 0.98 IU/mL — ABNORMAL HIGH (ref 0.30–0.70)

## 2018-12-02 LAB — COOXEMETRY PANEL
Carboxyhemoglobin: 1.6 % — ABNORMAL HIGH (ref 0.5–1.5)
Methemoglobin: 1.6 % — ABNORMAL HIGH (ref 0.0–1.5)
O2 Saturation: 84.3 %
Total hemoglobin: 10.5 g/dL — ABNORMAL LOW (ref 12.0–16.0)

## 2018-12-02 MED ORDER — DIAZEPAM 5 MG PO TABS
5.0000 mg | ORAL_TABLET | Freq: Every evening | ORAL | Status: DC | PRN
  Administered 2018-12-02: 5 mg via ORAL
  Filled 2018-12-02: qty 1

## 2018-12-02 MED ORDER — LISINOPRIL 10 MG PO TABS
10.0000 mg | ORAL_TABLET | Freq: Every day | ORAL | Status: DC
  Administered 2018-12-02: 10 mg via ORAL
  Filled 2018-12-02: qty 1

## 2018-12-02 MED ORDER — BISOPROLOL FUMARATE 5 MG PO TABS
2.5000 mg | ORAL_TABLET | Freq: Every day | ORAL | Status: DC
  Administered 2018-12-02 – 2018-12-03 (×2): 2.5 mg
  Filled 2018-12-02 (×2): qty 1

## 2018-12-02 MED ORDER — DOCUSATE SODIUM 100 MG PO CAPS
100.0000 mg | ORAL_CAPSULE | Freq: Every day | ORAL | Status: DC | PRN
  Administered 2018-12-02 – 2018-12-10 (×3): 100 mg via ORAL
  Filled 2018-12-02 (×3): qty 1

## 2018-12-02 MED ORDER — FUROSEMIDE 10 MG/ML IJ SOLN
40.0000 mg | Freq: Once | INTRAMUSCULAR | Status: AC
  Administered 2018-12-02: 40 mg via INTRAVENOUS
  Filled 2018-12-02: qty 4

## 2018-12-02 NOTE — Progress Notes (Addendum)
[place] for Heparin (Xarelto on hold) Indication: chest pain/ACS, suspected NSTEMI  No Known Allergies  Patient Measurements: Height: 6' (182.9 cm) Weight: 269 lb 2.9 oz (122.1 kg) IBW/kg (Calculated) : 77.6 Heparin Dosing Weight: 106  Vital Signs: Temp: 100.4 F (38 C) (01/30 1148) Temp Source: Oral (01/30 1148) BP: 126/68 (01/30 1230) Pulse Rate: 63 (01/30 1230)  Labs: Recent Labs    12/01/18 0011  12/01/18 0323 12/01/18 0408 12/01/18 1132 12/01/18 1206 12/01/18 1555 12/02/18 0342 12/02/18 0419 12/02/18 0636 12/02/18 1336  HGB 9.4*   < > 10.9*  --   --   --   --   --  10.9* 10.1*  --   HCT 32.5*   < > 32.0*  --   --   --   --   --  32.0* 32.1*  --   PLT 328  --   --   --   --   --   --   --   --  374  --   APTT  --   --   --   --   --   --   --  52*  --   --  84*  HEPARINUNFRC  --   --   --   --  0.72*  --   --  0.67  --   --   --   CREATININE 3.48*  --   --  3.15*  --   --  2.15* 1.56*  --   --   --   TROPONINI  --   --   --  0.36*  --  0.42*  --   --   --   --   --    < > = values in this interval not displayed.    Estimated Creatinine Clearance: 59.5 mL/min (A) (by C-G formula based on SCr of 1.56 mg/dL (H)).   Medical History: Past Medical History:  Diagnosis Date  . Atrial flutter ([place])    a. recurrent AFlutter with RVR;  b. Amiodarone Rx started 4/16  . CAD (coronary artery disease)    a. LHC 1/16:  mLAD diffuse disease, pLCx mild disease, dLCx with disease but too small for PCI, RCA ok, EF 25-30%  . Chronic pain   . Chronic systolic CHF (congestive heart failure) ([place])   . COPD (chronic obstructive pulmonary disease) ([place])   . Diabetes mellitus without complication ([place])   . Hypercholesteremia   . Hypertension   . NICM (nonischemic cardiomyopathy) ([place])    a.dx 2016. b. 2D echo 06/2016 - Last echo 07/01/16: mod dilated LV, mod LVH, EF 25-30%, mild-mod MR, sev LAE, mild-mod reduced RV systolic function, mild-mod  TR, PASP 63mHG.  .Marland KitchenPAF (paroxysmal atrial fibrillation) (HCC)    On amio - ot a candidate for flecainide due to cardiomyopathy, not a candidate for Tikosyn due to prolonged QT, and felt to be a poor candidate for ablation given left atrial size.  . [place]monary hypertension [place]   . Tobacco abuse     Medications:  Scheduled:  . bisoprolol  2.5 mg Per Tube Daily  . budesonide (PULMICORT) nebulizer solution  0.5 mg Nebulization BID  . chlorhexidine gluconate (MEDLINE KIT)  15 mL Mouth Rinse BID  . insulin aspart  0-15 Units Subcutaneous Q4H  . ipratropium-albuterol  3 mL Nebulization Q6H  . mouth rinse  15 mL Mouth Rinse 10 times per day  . methylPREDNISolone (SOLU-MEDROL) injection  60  mg Intravenous Daily  . pantoprazole (PROTONIX) IV  40 mg Intravenous QHS  . rosuvastatin  20 mg Per Tube Daily    Assessment: 73 yo with hx of Afib (on xarleto PTA, last dose PTA ~1800 on 0/10), COPD, systolic CHF, chronic pain. Presented with lethargy, hypotension and bradycardia. Patient was intubated for airway protection / acute hypoxic / hypercarbic respiratory failure. Patients tropoinin elevated at 0.32>0.36>0.42 -Hgb slightly low at 10.1 Hct 32.1, and platelets wnl at 374.  1/30 AM: aPTT is sub-therapeutic at 52, CBC stable  1/30 PM update: aPTT and heparin level are correlating, both heparin and aPTT bumped with increase in heparin infusion rate. Heparin level is elevated at 0.98 and SUPRAtherapeutic. Called nurse to ask about lab drawing techniques, levels were drawn appropriately from the central line while the heparin was infusing into the peripheral line. Nurse did comment that patient was bleeding slightly out of their peripheral IV earlier this morning.    Goal of Therapy:  Heparin level 0.3-0.7 units/ml Monitor platelets by anticoagulation protocol: Yes   Plan:  Decrease heparin to 1250 units / hour Heparin level at 2300 tonight Daily Heparin level and CBC in AM Monitor for signs /  symptoms of bleeding.  Thank you for allowing pharmacy to be a part of this patient's care.  Tamela Gammon, PharmD 12/02/2018 2:14 PM PGY-1 Pharmacy Resident Direct Phone: [phone] Please check [email or web address] for unit-specific pharmacist phone numbers

## 2018-12-02 NOTE — Progress Notes (Signed)
[place] Progress Note Patient Name: Nicholas Caldwell DOB: [birth year] MRN: [id number]   Date of Service  12/02/2018  HPI/Events of Note  Patient requesting for valium and gabapentin.  HR 90s in afib  eICU Interventions  Resumed Valium 5 mg q hs prn, monitor HR        Sandralee Tarkington T Kayanna Mckillop 12/02/2018, 11:13 PM

## 2018-12-02 NOTE — Progress Notes (Signed)
[place] for Heparin (Xarelto on hold) Indication: chest pain/ACS, suspected NSTEMI  No Known Allergies  Patient Measurements: Height: 6' (182.9 cm) Weight: 269 lb 2.9 oz (122.1 kg) IBW/kg (Calculated) : 77.6 Heparin Dosing Weight: 106  Vital Signs: Temp: 100 F (37.8 C) (01/30 0340) Temp Source: Axillary (01/30 0340) BP: 166/67 (01/30 0400) Pulse Rate: 63 (01/30 0419)  Labs: Recent Labs    12/01/18 0011 12/01/18 0052 12/01/18 0323 12/01/18 0408 12/01/18 1132 12/01/18 1206 12/01/18 1555 12/02/18 0342 12/02/18 0419  HGB 9.4* 11.2* 10.9*  --   --   --   --   --  10.9*  HCT 32.5* 33.0* 32.0*  --   --   --   --   --  32.0*  PLT 328  --   --   --   --   --   --   --   --   APTT  --   --   --   --   --   --   --  52*  --   HEPARINUNFRC  --   --   --   --  0.72*  --   --  0.67  --   CREATININE 3.48*  --   --  3.15*  --   --  2.15* 1.56*  --   TROPONINI  --   --   --  0.36*  --  0.42*  --   --   --     Estimated Creatinine Clearance: 59.5 mL/min (A) (by C-G formula based on SCr of 1.56 mg/dL (H)).   Medical History: Past Medical History:  Diagnosis Date  . Atrial flutter ([place])    a. recurrent AFlutter with RVR;  b. Amiodarone Rx started 4/16  . CAD (coronary artery disease)    a. LHC 1/16:  mLAD diffuse disease, pLCx mild disease, dLCx with disease but too small for PCI, RCA ok, EF 25-30%  . Chronic pain   . Chronic systolic CHF (congestive heart failure) ([place])   . COPD (chronic obstructive pulmonary disease) ([place])   . Diabetes mellitus without complication ([place])   . Hypercholesteremia   . Hypertension   . NICM (nonischemic cardiomyopathy) ([place])    a.dx 2016. b. 2D echo 06/2016 - Last echo 07/01/16: mod dilated LV, mod LVH, EF 25-30%, mild-mod MR, sev LAE, mild-mod reduced RV systolic function, mild-mod TR, PASP 19mHG.  .Marland KitchenPAF (paroxysmal atrial fibrillation) ([facility])    On amio - ot a candidate for flecainide due to cardiomyopathy,  not a candidate for Tikosyn due to prolonged QT, and felt to be a poor candidate for ablation given left atrial size.  . [place]monary hypertension [place]   . Tobacco abuse     Medications:  Scheduled:  . budesonide (PULMICORT) nebulizer solution  0.5 mg Nebulization BID  . chlorhexidine gluconate (MEDLINE KIT)  15 mL Mouth Rinse BID  . insulin aspart  0-15 Units Subcutaneous Q4H  . ipratropium-albuterol  3 mL Nebulization Q6H  . mouth rinse  15 mL Mouth Rinse 10 times per day  . methylPREDNISolone (SOLU-MEDROL) injection  60 mg Intravenous Daily  . pantoprazole (PROTONIX) IV  40 mg Intravenous QHS  . rosuvastatin  20 mg Per Tube Daily    Assessment: 73yo with hx of Afib (on xarleto PTA, last dose PTA ~1800 on 13/84, COPD, systolic CHF, chronic pain. Presented with lethargy, hypotension and bradycardia. Patient was intubated for airway protection / acute hypoxic / hypercarbic  respiratory failure.  -Patients tropoinin elevated at 0.32 > 0.36. -Hgb slightly low at 10.9, Hct 32, and platelets wnl at 328. -Baseline heparin level elevated, currently using aPTT to dose heparin for now  1/30 AM update: aPTT is sub-therapeutic at 52, CBC stable  Goal of Therapy:  Heparin level 0.3-0.7 units/ml aPTT 66-102 seconds Monitor platelets by anticoagulation protocol: Yes   Plan:  Inc heparin to 1400 units/hr Re-check HL/aPTT in 8 hours  Narda Bonds, PharmD, [place] Pharmacist Phone: [phone]

## 2018-12-02 NOTE — Progress Notes (Signed)
*  PRELIMINARY RESULTS* Echocardiogram 2D Echocardiogram has been performed.  Leavy Cella 12/02/2018, 11:19 AM

## 2018-12-02 NOTE — Progress Notes (Addendum)
Advanced Heart Failure Rounding Note  PCP-Cardiologist: No primary care provider on file.   Subjective:    Negative 1.7 L and down 10 lbs with IV lasix 80 mg BID. CVP 5-6 cm.   Remains intubated and sedated. On Fentanyl gtt and Precedex.   Cr 3.15 -> 2.15 -> 1.56 with diuresis and pressure support. K 4.7.  Objective:   Weight Range: 122.1 kg Body mass index is 36.51 kg/m.   Vital Signs:   Temp:  [98.7 F (37.1 C)-100.1 F (37.8 C)] 99.8 F (37.7 C) (01/30 0734) Pulse Rate:  [39-91] 68 (01/30 0838) Resp:  [12-29] 21 (01/30 0838) BP: (98-169)/(38-75) 150/56 (01/30 0830) SpO2:  [92 %-100 %] 97 % (01/30 0838) FiO2 (%):  [40 %] 40 % (01/30 0715) Weight:  [122.1 kg] 122.1 kg (01/30 0309) Last BM Date: (PTA)  Weight change: Filed Weights   12/01/18 0009 12/01/18 0416 12/02/18 0309  Weight: 56 kg 127 kg 122.1 kg   Intake/Output:   Intake/Output Summary (Last 24 hours) at 12/02/2018 0913 Last data filed at 12/02/2018 0838 Gross per 24 hour  Intake 2661.88 ml  Output 4740 ml  Net -2078.12 ml    Physical Exam    General: Intubated/sedated.  HEENT: + ETT Neck: Supple. JVP ~8. Carotids 2+ bilat; no bruits. No thyromegaly or nodule noted. Cor: PMI nondisplaced. RRR, No M/G/R noted Lungs: Diminished basilar sounds Abdomen: Soft, non-tender, non-distended, no HSM. No bruits or masses. +BS  Extremities: No cyanosis, clubbing, or rash. Trace ankle edema.  Neuro: Intubated/sedated   Telemetry   NSR 60-70s, personally reviewed.   EKG    No new tracings.    Labs    CBC Recent Labs    12/01/18 0011  12/02/18 0419 12/02/18 0636  WBC 11.3*  --   --  7.0  NEUTROABS 9.7*  --   --   --   HGB 9.4*   < > 10.9* 10.1*  HCT 32.5*   < > 32.0* 32.1*  MCV 90.5  --   --  82.5  PLT 328  --   --  374   < > = values in this interval not displayed.   Basic Metabolic Panel Recent Labs    12/01/18 0408 12/01/18 1555 12/02/18 0342 12/02/18 0419  NA 135 138 140 138    K 5.4* 5.3* 4.7 4.8  CL 90* 92* 95*  --   CO2 _0 --   GLUCOSE 78 159* 167*  --   BUN 96* 92* 80*  --   CREATININE 3.15* 2.15* 1.56*  --   CALCIUM 8.8* 8.8* 9.0  --   MG 2.9*  --   --   --   PHOS 5.5*  --   --   --    Liver Function Tests No results for input(s): AST, ALT, ALKPHOS, BILITOT, PROT, ALBUMIN in the last 72 hours. No results for input(s): LIPASE, AMYLASE in the last 72 hours. Cardiac Enzymes Recent Labs    12/01/18 0408 12/01/18 1206  TROPONINI 0.36* 0.42*    BNP: BNP (last 3 results) Recent Labs    11/16/18 0237 11/21/18 2238 12/01/18 0015  BNP 606.1* 1,739.7* 1,666.7*    ProBNP (last 3 results) No results for input(s): PROBNP in the last 8760 hours.   D-Dimer No results for input(s): DDIMER in the last 72 hours. Hemoglobin A1C No results for input(s): HGBA1C in the last 72 hours. Fasting Lipid Panel Recent Labs    12/01/18 0408  TRIG 116   Thyroid Function Tests No results for input(s): TSH, T4TOTAL, T3FREE, THYROIDAB in the last 72 hours.  Invalid input(s): FREET3  Other results:   Imaging    US Renal  Result Date: 12/01/2018 CLINICAL DATA:  Acute renal failure EXAM: RENAL / URINARY TRACT ULTRASOUND COMPLETE COMPARISON:  None. FINDINGS: Right Kidney: Renal measurements: 11.1 x 5.1 x 5.8 cm = volume: 172 mL . Echogenicity within normal limits. No mass or hydronephrosis visualized. Left Kidney: Not well visualized. Bladder: Decompressed by Foley catheter. IMPRESSION: Right kidney is within normal limits. The left kidney is not visualized. Correlation to any prior surgical excision is recommended. Electronically Signed   By: Inez Catalina M.D.   On: 12/01/2018 16:14   Dg Chest Port 1 View  Result Date: 12/01/2018 CLINICAL DATA:  Respiratory failure.  New central venous line. EXAM: PORTABLE CHEST 1 VIEW COMPARISON:  Chest radiograph 12/01/2018 at 0114 hours FINDINGS: New RIGHT subclavian central venous catheter with distal tip projecting  distal superior vena cava. Endotracheal tube tip projects 3 cm above the carina. Nasogastric tube past GE junction region, out of field of view. Stable cardiomegaly. Pulmonary vascular congestion interstitial prominence with retrocardiac consolidation. Small LEFT pleural effusion. Unremarkable. IMPRESSION: New RIGHT subclavian central venous catheter distal tip projects in distal superior vena cava. Endotracheal tube tip projects 3 cm above the carina. Nasogastric tube past GE junction region. No pneumothorax. Stable cardiomegaly and interstitial prominence most compatible pulmonary edema. Persistent retrocardiac consolidation and small LEFT pleural effusion. Electronically Signed   By: Elon Alas M.D.   On: 12/01/2018 19:39      Medications:     Scheduled Medications: . budesonide (PULMICORT) nebulizer solution  0.5 mg Nebulization BID  . chlorhexidine gluconate (MEDLINE KIT)  15 mL Mouth Rinse BID  . insulin aspart  0-15 Units Subcutaneous Q4H  . ipratropium-albuterol  3 mL Nebulization Q6H  . mouth rinse  15 mL Mouth Rinse 10 times per day  . methylPREDNISolone (SOLU-MEDROL) injection  60 mg Intravenous Daily  . pantoprazole (PROTONIX) IV  40 mg Intravenous QHS  . rosuvastatin  20 mg Per Tube Daily     Infusions: . sodium chloride    . dexmedetomidine (PRECEDEX) IV infusion 1.2 mcg/kg/hr (12/02/18 0800)  . fentaNYL infusion INTRAVENOUS 150 mcg/hr (12/02/18 0800)  . heparin 1,400 Units/hr (12/02/18 0800)  . norepinephrine (LEVOPHED) Adult infusion Stopped (12/01/18 2217)  . propofol (DIPRIVAN) infusion Stopped (12/01/18 1033)     PRN Medications:  albuterol, fentaNYL, midazolam    Patient Profile   Nicholas Caldwell is a 73 y.o. male with PMH of chronic systolic CHF/nonischemic cardiomyopathy Echo 12/13/2016 LVEF 40-45%, COPD on home oxygen, PAFs/p DCCV 2018,and history of non compliance.  Admitted overnight with acute respiratory failure, acute renal failure, and  hypotension.   Assessment/Plan   1. Acute on chronic hypercarbic/hypoxemic respiratory failure: He is on home oxygen at baseline.  Patient was intubated at admission.  Initial lesion uncertain.  He initially appeared volume overloaded on exam with pulmonary edema on CXR, so this certainly plays a role.  Also concern for oversedation from home meds. No active wheeze on exam.  - Getting steroids with history of COPD.  - Vent weaning per CCM.   - Will need eventual sleep study. Was scheduled for 11/30/2018. - Continue diuresis as below.  2. Acute on chronic systolic CHF: Echo in 8/32 with EF 40-45%, mild RV dilation.  Nonischemic cardiomyopathy.  He was admitted with hypotension and was initially on norepinephrine.  Pulmonary edema on initial CXR and volume overloaded on exam.  Also with AKI at admission.  - Down 10 lbs with 80 mg IV lasix BID yesterday. CVP 5-6 cm. Hold lasix this am.  - Coox 84%.  - BP running up into 140-160s.  - Holding lisinopril and spironolactone with AKI. Now improving.  - Having more PVCs off of his BB. Bisoprolol on hold. BP running 140-160s, so may be able to resume soon.  - Repeat Echo pending.  3. AKI: Baseline creatinine 1.0.  - Creatinine down to 1.56 with diuresis.  - Holding lisinopril and spironolactone with AKI, which is now improving.  4. Shock/hypotension: Presented with low BP. No fever and WBCs near normal.  - Unclear at this time what caused his presentation.  - Now off NE - Coox 84%. CVP 5-6  5. COPD:  - Per CCM.  6. Atrial fibrillation: Paroxysmal.   - Remains is in NSR. Xarelto on hold in case procedures needed.  7. Elevated troponin: Mild troponin elevation with no trend.   - Likely demand ischemia with volume overload/hypotension.   Medication concerns reviewed with patient and pharmacy team. Barriers identified: None at this time.   Length of Stay: 1  Annamaria Helling  12/02/2018, 9:13 AM  Advanced Heart Failure Team Pager  [phone] (M-F; 7a - 4p)  Please contact [facility] for night-coverage after hours (4p -7a ) and weekends on [email or web address]   Patient seen with PA, agree with the above note.   He diuresed well overnight, CVP down to 5-6.  Now off norepinephrine.  Based on CXR, respiratory failure appeared to be due at least in part to pulmonary edema.   On exam, he is awake on vent.  No JVD.  No edema.   Can hold Lasix today with CVP down to 4-5.  Hopefully he can be extubated today.   Creatinine trending down, ACEi and spironolactone on hold.   BP now high, can start back on bisoprolol 2.5 mg daily.   Pending echo.   With episode of ?flash pulmonary edema and fairly rapid improvement, think it would be reasonable to have him do right/left heart cath when creatinine has recovered to baseline and he is extubated.  ?Monday.   Loralie Champagne 12/02/2018 1:14 PM

## 2018-12-02 NOTE — Procedures (Signed)
Extubation Procedure Note  Patient Details:   Name: Nicholas Caldwell DOB: 10-31-1946 MRN: [id number]   Airway Documentation:    Vent end date: 12/02/18 Vent end time: 1436   Evaluation  O2 sats: stable throughout Complications: No apparent complications Patient did tolerate procedure well. Bilateral Breath Sounds: Clear, Diminished   Yes   PT was extubated to a 4 litter [name]  PT has a strong cough and is able to clear secretions  RT to monitor    Hassan Blackshire, Leonie Douglas 12/02/2018, 2:36 PM

## 2018-12-02 NOTE — Progress Notes (Signed)
NAME:  Nicholas Caldwell, MRN:  [id number], DOB:  09-13-1946, LOS: 1 ADMISSION DATE:  11/30/2018, CONSULTATION DATE: 1/29 REFERRING MD:  Dr. Randal Buba, CHIEF COMPLAINT:  AMS   Brief History   This is a 73 year old male with a history of COPD and HFrEF who presented with lethargy, hypotension and bradycardic who was intuabted for acute hypoxic/hypercarbic respiratory failure.   History of present illness   This is a 73 year old male with a history of atrial fibrillation on xeralto, COPD (on 3LNC at home), systolic CHF (EF 22-97%), nonischemic cardiomyopathy, DM type 2, HTN, and chronic pain who presented to [facility] on 1/29 for hypotension and weakness. He had taken extra pain medications before going to bed. He takes valium, neurontin, and percocet at home. The wife was worried that he was getting too sleepy and started shaking. He was brought to the ED where he was found to be lethargic. CXR was concerning for pulmonary edema and he was started on BiPAP and given 40 mg Lasix. Labs were significant for K 6.7, Cr 3.48 (baseline of around 1), BNP 1700, WBC 11.2, Hgb 9.4. He was given insulin/dextrose, bicarb, and calcium. He was also given narcan with no improvement in his mental status. His mental status worsened and he was intubated for hypercarbic and hypoxemic respiratory failure.    Patient was admitted from 1/19-1/24 for a COPD and CHF exacerbation. Patient was aggressively diuresed and given IV steroids during that hospitalization. Weight on discharge was 56.2kg. Weight on admission was 56 kg. He was discharged home on torsemide 80 mg BID.   Past Medical History   Past Medical History:  Diagnosis Date  . Atrial flutter ([place])    a. recurrent AFlutter with RVR;  b. Amiodarone Rx started 4/16  . CAD (coronary artery disease)    a. LHC 1/16:  mLAD diffuse disease, pLCx mild disease, dLCx with disease but too small for PCI, RCA ok, EF 25-30%  . Chronic pain   . Chronic systolic CHF (congestive heart  failure) ([place])   . COPD (chronic obstructive pulmonary disease) ([place])   . Diabetes mellitus without complication ([place])   . Hypercholesteremia   . Hypertension   . NICM (nonischemic cardiomyopathy) ([place])    a.dx 2016. b. 2D echo 06/2016 - Last echo 07/01/16: mod dilated LV, mod LVH, EF 25-30%, mild-mod MR, sev LAE, mild-mod reduced RV systolic function, mild-mod TR, PASP 21mHG.  .Marland KitchenPAF (paroxysmal atrial fibrillation) ([facility])    On amio - ot a candidate for flecainide due to cardiomyopathy, not a candidate for Tikosyn due to prolonged QT, and felt to be a poor candidate for ablation given left atrial size.  . Pulmonary hypertension ([place]   . Tobacco abuse      Significant Hospital Events   1/29> Intubated and admitted to ICU  Consults:  None  Procedures:  ETT 1/29 >  Significant Diagnostic Tests:  Renal U/S pending  CXR: Vascular congestion, pulmonary edema Micro Data:    Antimicrobials:     Interim history/subjective:  Overnight patient was continuously reaching for his ET tube and a safety restraint was placed.  He had a central line placed yesterday with no apparent complications.  Patient is currently intubated and sedated, not following any commands.  Spoke with wife at bedside.  Objective   Blood pressure (!) 162/67, pulse 62, temperature 100 F (37.8 C), temperature source Axillary, resp. rate 16, height 6' (1.829 m), weight 122.1 kg, SpO2 98 %. CVP:  [12 mmHg-15  mmHg] 12 mmHg  Vent Mode: PRVC FiO2 (%):  [40 %] 40 % Set Rate:  [16 bmp] 16 bmp Vt Set:  [620 mL] 620 mL PEEP:  [5 cmH20] 5 cmH20 Pressure Support:  [10 cmH20-15 cmH20] 15 cmH20 Plateau Pressure:  [13 cmH20-22 cmH20] 22 cmH20   Intake/Output Summary (Last 24 hours) at 12/02/2018 0658 Last data filed at 12/02/2018 8250 Gross per 24 hour  Intake 2755.01 ml  Output 4440 ml  Net -1684.99 ml   Filed Weights   12/01/18 0009 12/01/18 0416 12/02/18 0309  Weight: 56 kg 127 kg 122.1 kg     Examination: General: Intubated, sedated, resting comfortably HENT: Atraumatic, normocephalic, IJ central line in place Lungs: Minimal bibasilar crackles, no wheezing or rhonchi, on ventilator Cardiovascular: Bradycardic, no murmurs, rubs or gallops Abdomen: Soft, non-tender, +BS Extremities: 1+ BL LE edema, SCDs in place Neuro: Sedated, not following commands GU: Condom cath in place  Resolved Hospital Problem list    Assessment & Plan:   Acute hypoxic respiratory failure likely secondary to heart failure exacerbation versus COPD exacerbation Intubated Presumed OSA Cardiogenic shock, resolved -Likely multifactorial, has a history of HFrEF and COPD on 3L [place] at home. He also may have OSA, sleep study being done at [place]. He also takes multiple pain medications at home that may have decreased his respiratory drive.  Overall patient has had a great response to Lasix, 80 mg yesterday, and has had a net output of 1700 cc overnight.  CVP has improved to 12 today. He does still have some bibasilar crackles but lower extremity swelling has improved.  -Full vent support -Pulmonary hygiene -Wean down sedation today, if mental status improves we may be able to extubate today depending on improvement of his mental status. -Scheduled duonebs -Continue steroids x 5 days -Continue propofol and fentanyl for RASS 0 to -1 -Daily sedation vacation and SBT  HFrEF:  Echo in June 2019 showed EF 40-45%, PA peak pressure 62 mmHg. Follows with heart failure service.  Patient has had good output and improvement on exam. Was placed yesterday to monitor volume status.  CVP down to 12 today.  Patient is improvement in his clinical status however still seems slightly volume overloaded.  Will continue diuresis with dose of Lasix today.  It was initially hypertensive and required vasopressors earlier during admission, he is now off vasopressors and is actually becoming hypertensive.  We will start some of his home  medications today. -Heart failure following, appreciate recommendations -Continue diuresis today, 40 mg IV lasix once -Strict I+Os, daily weights -Holding home spironolactone, torsemide -Continue crestor and zebeta -Start home lisinopril 10 mg daily  Paroxsymal A fibrillation NSTEMI -Troponin elevated to 0.36, did not present with chest. EKG showed dynamic changes.  -Likely 2/2 demand ischemia, repeat troponin was unchanged. Troponins 0.36 > 0.4  -Complete heparin drip x48 hours -Cardiology consult > suspect demand ischemia.   AKI 2/2 cardiorenal syndrome: -Cr increased to 3.48 on admission, baseline around 1.  AKI is improving, it is 1.56 today. -Repeat BMP in AM -Renal ultrasound> right kidney is within normal limits, left kidney is nonvisualized.  No recorded history of any kidney surgery, does have history of splenectomy which may have complicated the imaging.   -Since patient has had improvement in his AKI we will continue to monitor with no further imaging at this time.  COPD: -Patient is on symbicort and duonebs at home. He is on oxygen 3L at home. Patient does not have any wheezing on exam.  -  Continue duonebs scheduled -Continue IV steroids x 5 day, can transition to oral once extubated and he gets a diet  Hyperkalemia: Has been given temporizing measures in the ED -Resolved.  BMP today showed K 4.7.  -BMP daily  DM: -On metformin at home, hold this -SSI -Monitor CBGs  Best practice:  Diet: NPO, hold tube feeds for possible extubation Pain/Anxiety/Delirium protocol (if indicated): Fentanyl and propofol VAP protocol (if indicated): Per protocol DVT prophylaxis: Heparin GI prophylaxis: PPI Glucose control: SSI Mobility: BR Code Status: Full Family Communication:  Disposition: Remain in ICU  Labs   CBC: Recent Labs  Lab 12/01/18 0011 12/01/18 0052 12/01/18 0323 12/02/18 0419  WBC 11.3*  --   --   --   NEUTROABS 9.7*  --   --   --   HGB 9.4* 11.2* 10.9*  10.9*  HCT 32.5* 33.0* 32.0* 32.0*  MCV 90.5  --   --   --   PLT 328  --   --   --     Basic Metabolic Panel: Recent Labs  Lab 11/26/18 0839 12/01/18 0011  12/01/18 0323 12/01/18 0408 12/01/18 1555 12/02/18 0342 12/02/18 0419  NA 134* 132*   < > 132* 135 138 140 138  K 4.3 6.7*   < > 5.2* 5.4* 5.3* 4.7 4.8  CL 92* 93*  --   --  90* 92* 95*  --   CO2 34* 29  --   --  _0 --   GLUCOSE 254* 83  --   --  78 159* 167*  --   BUN 33* 94*  --   --  96* 92* 80*  --   CREATININE 1.06 3.48*  --   --  3.15* 2.15* 1.56*  --   CALCIUM 8.1* 8.1*  --   --  8.8* 8.8* 9.0  --   MG 2.3  --   --   --  2.9*  --   --   --   PHOS  --   --   --   --  5.5*  --   --   --    < > = values in this interval not displayed.   GFR: Estimated Creatinine Clearance: 59.5 mL/min (A) (by C-G formula based on SCr of 1.56 mg/dL (H)). Recent Labs  Lab 12/01/18 0011  WBC 11.3*    Liver Function Tests: No results for input(s): AST, ALT, ALKPHOS, BILITOT, PROT, ALBUMIN in the last 168 hours. No results for input(s): LIPASE, AMYLASE in the last 168 hours. No results for input(s): AMMONIA in the last 168 hours.  ABG    Component Value Date/Time   PHART 7.527 (H) 12/02/2018 0419   PCO2ART 43.4 12/02/2018 0419   PO2ART 95.0 12/02/2018 0419   HCO3 35.8 (H) 12/02/2018 0419   TCO2 37 (H) 12/02/2018 0419   ACIDBASEDEF 2.0 02/19/2015 1003   O2SAT 84.3 12/02/2018 0535     Coagulation Profile: No results for input(s): INR, PROTIME in the last 168 hours.  Cardiac Enzymes: Recent Labs  Lab 12/01/18 0408 12/01/18 1206  TROPONINI 0.36* 0.42*    HbA1C: Hgb A1c MFr Bld  Date/Time Value Ref Range Status  11/13/2018 06:02 AM 6.5 (H) 4.8 - 5.6 % Final    Comment:    (NOTE) Pre diabetes:          5.7%-6.4% Diabetes:              >6.4% Glycemic control for   <7.0%  adults with diabetes   05/01/2018 05:10 AM 8.1 (H) 4.8 - 5.6 % Final    Comment:    (NOTE) Pre diabetes:          5.7%-6.4% Diabetes:               >6.4% Glycemic control for   <7.0% adults with diabetes     CBG: Recent Labs  Lab 12/01/18 1126 12/01/18 1612 12/01/18 1905 12/01/18 2321 12/02/18 0332  GLUCAP 135* 154* 154* 209* 160*    Review of Systems:   Unable to obtain due to patients mental status  Past Medical History  He,  has a past medical history of Atrial flutter ([place]), CAD (coronary artery disease), Chronic pain, Chronic systolic CHF (congestive heart failure) ([place]), COPD (chronic obstructive pulmonary disease) ([place]), Diabetes mellitus without complication ([place]), Hypercholesteremia, Hypertension, NICM (nonischemic cardiomyopathy) ([place]), PAF (paroxysmal atrial fibrillation) ([place]), Pulmonary hypertension ([place]), and Tobacco abuse.   Surgical History    Past Surgical History:  Procedure Laterality Date  . CARDIOVERSION N/A 07/30/2018   Procedure: CARDIOVERSION;  Surgeon: Larey Dresser, MD;  Location[facility] ENDOSCOPY;  Service: Cardiovascular;  Laterality: N/A;  . LEFT AND RIGHT HEART CATHETERIZATION WITH CORONARY ANGIOGRAM N/A 11/06/2014   Procedure: LEFT AND RIGHT HEART CATHETERIZATION WITH CORONARY ANGIOGRAM;  Surgeon: Jettie Booze, MD;  Location: [facility] CATH LAB;  Service: Cardiovascular;  Laterality: N/A;  . SPLENECTOMY       Social History   reports that he has been smoking cigarettes. He has a 34.00 pack-year smoking history. He has never used smokeless tobacco. He reports that he does not drink alcohol or use drugs.   Family History   His family history includes Heart disease in his father and mother; Heart failure in his mother; Hypertension in his mother and sister. There is no history of Heart attack or Stroke.   Allergies No Known Allergies   Home Medications  Prior to Admission medications   Medication Sig Start Date End Date Taking? Authorizing Provider  bisoprolol (ZEBETA) 5 MG tablet Take 0.5 tablets (2.5 mg total) by mouth daily for 30 days. 11/26/18 12/26/18  Amin, Jeanella Flattery, MD   budesonide-formoterol (SYMBICORT) 160-4.5 MCG/ACT inhaler Inhale 2 puffs into the lungs 2 (two) times daily.    [provider]  diazepam (VALIUM) 10 MG tablet Take 0.5 tablets (5 mg total) by mouth daily as needed for anxiety or sleep. Patient taking differently: Take 10 mg by mouth daily as needed for anxiety or sleep.  12/19/16   Theodis Blaze, MD  ferrous sulfate 325 (65 FE) MG tablet Take 325 mg by mouth 3 (three) times daily as needed (for supplementation).    [provider]  fluticasone (FLONASE) 50 MCG/ACT nasal spray Place 2 sprays into both nostrils daily as needed for allergies.     [provider]  folic acid (FOLVITE) 1 MG tablet Take 1 mg by mouth daily.    [provider]  gabapentin (NEURONTIN) 600 MG tablet Take 1 tablet (600 mg total) by mouth 2 (two) times daily. 12/19/16   Theodis Blaze, MD  glucose 4 GM chewable tablet Chew 4 tablets by mouth See admin instructions. Chew 4 tablets by mouth as needed for low blood sugar - repeat every 15 minutes if blood sugar less than 70    [provider]  guaifenesin (HUMIBID E) 400 MG TABS tablet Take 1 tablet (400 mg total) by mouth every 6 (six) hours as needed. Patient taking differently:  Take 400 mg by mouth every 6 (six) hours as needed (cough).  08/02/18   Regalado, Belkys A, MD  hydrocortisone (ANUSOL-HC) 2.5 % rectal cream Place 1 application rectally 2 (two) times daily as needed for hemorrhoids or itching.     [provider]  hydrocortisone 1 % lotion Apply 1 application topically See admin instructions. Apply small amount to back twice daily for itchy rash    [provider]  insulin aspart protamine - aspart (NOVOLOG MIX 70/30 FLEXPEN) (70-30) 100 UNIT/ML FlexPen Inject 0.05 mLs (5 Units total) into the skin 2 (two) times daily. Patient taking differently: Inject 30 Units into the skin 2 (two) times daily.  08/02/18   Regalado, Belkys A, MD  ipratropium-albuterol  (DUONEB) 0.5-2.5 (3) MG/3ML SOLN Take 3 mLs by nebulization 2 (two) times daily.    [provider]  latanoprost (XALATAN) 0.005 % ophthalmic solution Place 1 drop into both eyes at bedtime.    [provider]  lisinopril (PRINIVIL,ZESTRIL) 10 MG tablet Take 1 tablet (10 mg total) by mouth daily for 30 days. 11/27/18 12/27/18  Damita Lack, MD  Magnesium Oxide 420 (252 Mg) MG TABS Take 420 mg by mouth 2 (two) times daily.    [provider]  metFORMIN (GLUCOPHAGE-XR) 500 MG 24 hr tablet Take 1,000 mg by mouth 2 (two) times daily with a meal.    [provider]  oxyCODONE-acetaminophen (PERCOCET) 10-325 MG tablet Take 1 tablet by mouth every 6 (six) hours as needed for pain.    [provider]  OXYGEN Inhale 3 L into the lungs continuous.     [provider]  potassium chloride SA (K-DUR,KLOR-CON) 20 MEQ tablet Take 2 tablets (40 mEq total) by mouth every morning AND 1 tablet (20 mEq total) at bedtime. 11/26/18   Amin, Jeanella Flattery, MD  rivaroxaban (XARELTO) 20 MG TABS tablet Take 1 tablet (20 mg total) by mouth daily with supper. Patient taking differently: Take 20 mg by mouth daily at 6 PM.  01/20/17   Jettie Booze, MD  rosuvastatin (CRESTOR) 20 MG tablet Take 1 tablet (20 mg total) by mouth daily. 05/07/18   Matilde Haymaker, MD  spironolactone (ALDACTONE) 25 MG tablet Take 1 tablet (25 mg total) by mouth at bedtime. 02/04/18 02/04/19  Shirley Friar, PA-C  torsemide (DEMADEX) 20 MG tablet Take 4 tablets (80 mg total) by mouth 2 (two) times daily for 30 days. 11/26/18 12/26/18  Damita Lack, MD        [name], M.D. PGY1 Pager [phone] 12/02/2018 6:58 AM

## 2018-12-03 ENCOUNTER — Inpatient Hospital Stay: Payer: Medicare Other | Admitting: Primary Care

## 2018-12-03 DIAGNOSIS — R7989 Other specified abnormal findings of blood chemistry: Secondary | ICD-10-CM

## 2018-12-03 DIAGNOSIS — N179 Acute kidney failure, unspecified: Secondary | ICD-10-CM

## 2018-12-03 DIAGNOSIS — J810 Acute pulmonary edema: Secondary | ICD-10-CM

## 2018-12-03 LAB — HEPARIN LEVEL (UNFRACTIONATED)
HEPARIN UNFRACTIONATED: 0.87 [IU]/mL — AB (ref 0.30–0.70)
Heparin Unfractionated: 1.07 IU/mL — ABNORMAL HIGH (ref 0.30–0.70)

## 2018-12-03 LAB — BASIC METABOLIC PANEL
Anion gap: 13 (ref 5–15)
BUN: 65 mg/dL — ABNORMAL HIGH (ref 8–23)
CALCIUM: 8.8 mg/dL — AB (ref 8.9–10.3)
CO2: 33 mmol/L — ABNORMAL HIGH (ref 22–32)
CREATININE: 1.27 mg/dL — AB (ref 0.61–1.24)
Chloride: 98 mmol/L (ref 98–111)
GFR calc Af Amer: >60 mL/min (ref >60)
GFR calc non Af Amer: 57 mL/min — ABNORMAL LOW (ref >60)
Glucose, Bld: 162 mg/dL — ABNORMAL HIGH (ref 70–99)
Potassium: 3.8 mmol/L (ref 3.5–5.1)
Sodium: 144 mmol/L (ref 135–145)

## 2018-12-03 LAB — CBC
HCT: 29.8 % — ABNORMAL LOW (ref 39.0–52.0)
Hemoglobin: 9.1 g/dL — ABNORMAL LOW (ref 13.0–17.0)
MCH: 26.3 pg (ref 26.0–34.0)
MCHC: 30.5 g/dL (ref 30.0–36.0)
MCV: 86.1 fL (ref 80.0–100.0)
Platelets: 315 10*3/uL (ref 150–400)
RBC: 3.46 MIL/uL — ABNORMAL LOW (ref 4.22–5.81)
RDW: 17.3 % — AB (ref 11.5–15.5)
WBC: 7.9 10*3/uL (ref 4.0–10.5)
nRBC: 0 % (ref 0.0–0.2)

## 2018-12-03 LAB — APTT
aPTT: 93 seconds — ABNORMAL HIGH (ref 24–36)
aPTT: 91 seconds — ABNORMAL HIGH (ref 24–36)

## 2018-12-03 LAB — GLUCOSE, CAPILLARY
Glucose-Capillary: 157 mg/dL — ABNORMAL HIGH (ref 70–99)
Glucose-Capillary: 132 mg/dL — ABNORMAL HIGH (ref 70–99)
Glucose-Capillary: 145 mg/dL — ABNORMAL HIGH (ref 70–99)
Glucose-Capillary: 223 mg/dL — ABNORMAL HIGH (ref 70–99)
Glucose-Capillary: 312 mg/dL — ABNORMAL HIGH (ref 70–99)

## 2018-12-03 LAB — COOXEMETRY PANEL
Carboxyhemoglobin: 0.5 % (ref 0.5–1.5)
METHEMOGLOBIN: 1.3 % (ref 0.0–1.5)
O2 Saturation: 61.5 %
Total hemoglobin: 7.9 g/dL — ABNORMAL LOW (ref 12.0–16.0)

## 2018-12-03 MED ORDER — BISOPROLOL FUMARATE 5 MG PO TABS
2.5000 mg | ORAL_TABLET | Freq: Once | ORAL | Status: AC
  Administered 2018-12-03: 2.5 mg via ORAL
  Filled 2018-12-03: qty 1

## 2018-12-03 MED ORDER — BISOPROLOL FUMARATE 5 MG PO TABS: 5.0000 mg | ORAL_TABLET | Freq: Every day | ORAL | Status: DC

## 2018-12-03 MED ORDER — OXYCODONE-ACETAMINOPHEN 5-325 MG PO TABS
1.0000 | ORAL_TABLET | Freq: Three times a day (TID) | ORAL | Status: DC | PRN
  Administered 2018-12-03 – 2018-12-08 (×12): 2 via ORAL
  Filled 2018-12-03 (×3): qty 2
  Filled 2018-12-03: qty 1
  Filled 2018-12-03 (×3): qty 2
  Filled 2018-12-03 (×2): qty 1
  Filled 2018-12-03 (×4): qty 2
  Filled 2018-12-03: qty 1

## 2018-12-03 MED ORDER — ISOSORB DINITRATE-HYDRALAZINE 20-37.5 MG PO TABS
0.5000 | ORAL_TABLET | Freq: Three times a day (TID) | ORAL | Status: DC
  Administered 2018-12-03 – 2018-12-04 (×3): 0.5 via ORAL
  Filled 2018-12-03 (×2): qty 0.5
  Filled 2018-12-03 (×2): qty 1

## 2018-12-03 MED ORDER — TORSEMIDE 20 MG PO TABS
60.0000 mg | ORAL_TABLET | Freq: Two times a day (BID) | ORAL | Status: DC
  Administered 2018-12-03 – 2018-12-05 (×4): 60 mg via ORAL
  Filled 2018-12-03 (×6): qty 3

## 2018-12-03 MED ORDER — PREDNISONE 20 MG PO TABS: 60.0000 mg | ORAL_TABLET | Freq: Every day | ORAL | Status: DC

## 2018-12-03 MED ORDER — PREDNISONE 20 MG PO TABS
40.0000 mg | ORAL_TABLET | Freq: Every day | ORAL | Status: AC
  Administered 2018-12-04 – 2018-12-05 (×2): 40 mg via ORAL
  Filled 2018-12-03 (×2): qty 2

## 2018-12-03 MED ORDER — OXYCODONE-ACETAMINOPHEN 5-325 MG PO TABS
1.0000 | ORAL_TABLET | Freq: Four times a day (QID) | ORAL | Status: DC | PRN
  Administered 2018-12-03: 1 via ORAL
  Filled 2018-12-03: qty 1

## 2018-12-03 MED ORDER — ORAL CARE MOUTH RINSE
15.0000 mL | Freq: Two times a day (BID) | OROMUCOSAL | Status: DC
  Administered 2018-12-03 – 2018-12-12 (×12): 15 mL via OROMUCOSAL

## 2018-12-03 MED ORDER — GABAPENTIN 300 MG PO CAPS
600.0000 mg | ORAL_CAPSULE | Freq: Two times a day (BID) | ORAL | Status: DC
  Administered 2018-12-03: 600 mg via ORAL
  Filled 2018-12-03 (×2): qty 2

## 2018-12-03 MED ORDER — TORSEMIDE 20 MG PO TABS
80.0000 mg | ORAL_TABLET | Freq: Two times a day (BID) | ORAL | Status: DC
  Filled 2018-12-03: qty 4

## 2018-12-03 NOTE — Progress Notes (Signed)
SLP Cancellation Note  Patient Details Name: Nicholas Caldwell MRN: [id number] DOB: September 11, 1946   Cancelled treatment:       Reason Eval/Treat Not Completed: SLP screened, no needs identified, will sign off   Cole Klugh, Katherene Ponto 12/03/2018, 1:08 PM

## 2018-12-03 NOTE — Progress Notes (Signed)
NAME:  Nicholas Caldwell, MRN:  [id number], DOB:  1946-09-20, LOS: 2 ADMISSION DATE:  11/30/2018, CONSULTATION DATE: 1/29 REFERRING MD:  Dr. Randal Buba, CHIEF COMPLAINT:  AMS   Brief History   This is a 73 year old male with a history of COPD and HFrEF who presented with lethargy, hypotension and bradycardic who was intuabted for acute hypoxic/hypercarbic respiratory failure.   History of present illness   This is a 73 year old male with a history of atrial fibrillation on xeralto, COPD (on 3L[place] at home), systolic CHF (EF 03-50%), nonischemic cardiomyopathy, DM type 2, HTN, and chronic pain who presented to [facility] on 1/29 for hypotension and weakness. He had taken extra pain medications before going to bed. He takes valium, neurontin, and percocet at home. The wife was worried that he was getting too sleepy and started shaking. He was brought to the ED where he was found to be lethargic. CXR was concerning for pulmonary edema and he was started on BiPAP and given 40 mg Lasix. Labs were significant for K 6.7, Cr 3.48 (baseline of around 1), BNP 1700, WBC 11.2, Hgb 9.4. He was given insulin/dextrose, bicarb, and calcium. He was also given narcan with no improvement in his mental status. His mental status worsened and he was intubated for hypercarbic and hypoxemic respiratory failure.    Patient was admitted from 1/19-1/24 for a COPD and CHF exacerbation. Patient was aggressively diuresed and given IV steroids during that hospitalization. Weight on discharge was 56.2kg. Weight on admission was 56 kg. He was discharged home on torsemide 80 mg BID.   Past Medical History   Past Medical History:  Diagnosis Date  . Atrial flutter ([place])    a. recurrent AFlutter with RVR;  b. Amiodarone Rx started 4/16  . CAD (coronary artery disease)    a. LHC 1/16:  mLAD diffuse disease, pLCx mild disease, dLCx with disease but too small for PCI, RCA ok, EF 25-30%  . Chronic pain   . Chronic systolic CHF (congestive heart  failure) ([place])   . COPD (chronic obstructive pulmonary disease) ([place])   . Diabetes mellitus without complication ([place])   . Hypercholesteremia   . Hypertension   . NICM (nonischemic cardiomyopathy) ([place])    a.dx 2016. b. 2D echo 06/2016 - Last echo 07/01/16: mod dilated LV, mod LVH, EF 25-30%, mild-mod MR, sev LAE, mild-mod reduced RV systolic function, mild-mod TR, PASP 65mHG.  .Marland KitchenPAF (paroxysmal atrial fibrillation) (HCC)    On amio - ot a candidate for flecainide due to cardiomyopathy, not a candidate for Tikosyn due to prolonged QT, and felt to be a poor candidate for ablation given left atrial size.  . Pulmonary hypertension ([facility]   . Tobacco abuse      Significant Hospital Events   1/29> Intubated and admitted to ICU  Consults:  None  Procedures:  ETT 1/29 > 1/30  Significant Diagnostic Tests:  Renal U/S pending  CXR: Vascular congestion, pulmonary edema  Echocardiogram 1/30 IMPRESSIONS    1. The left ventricle has mild-moderately reduced systolic function of 409-38% The cavity size is normal. There is moderate left ventricular wall thickness. Echo evidence of pseudonormal diastolic filling patterns. Indeterminent filling pressures.  2. Endocardial border definition is poor and views are often foreshortened. Function appears unchanged from prior.  3. Severely dilated left atrial size.  4. Severely dilated right atrial size.  5. The mitral valve normal in structure. Regurgitation is mild by color flow Doppler.  6. Normal tricuspid valve.  7. Tricuspid regurgitation is mild.  8. The aortic valve normal. Aortic valve regurgitation is mild by color flow Doppler.  9. Pulmonic valve regurgitation is mild by color flow Doppler. 10. No atrial level shunt detected by color flow Doppler.  Micro Data:    Antimicrobials:     Interim history/subjective:  Overnight patient was requesting his home valium and gabapentin. He was started on his home valium. No other acute events.    Extubated yesterday with no complications. He reports that his breathing is doing a lot better compared to when he came in, is currently on 3L [place] which is his home oxygen level. He states that his LE swelling has improved. He was asking about his home medications, he take percocet at home for his right hip that he is going to have replaced. He is complaining of coughing today and was requesting some cough medicaitons.   Objective   Blood pressure 112/64, pulse 89, temperature 98.7 F (37.1 C), temperature source Oral, resp. rate 16, height 6' (1.829 m), weight 122.1 kg, SpO2 92 %. CVP:  [3 mmHg-9 mmHg] 7 mmHg  Vent Mode: CPAP;PSV FiO2 (%):  [40 %] 40 % PEEP:  [5 cmH20] 5 cmH20 Pressure Support:  [10 cmH20-15 cmH20] 10 cmH20   Intake/Output Summary (Last 24 hours) at 12/03/2018 3818 Last data filed at 12/03/2018 0500 Gross per 24 hour  Intake 519.43 ml  Output 3655 ml  Net -3135.57 ml   Filed Weights   12/01/18 0009 12/01/18 0416 12/02/18 0309  Weight: 56 kg 127 kg 122.1 kg    Examination: General: Well appearing male, no acute distress, resting comfortably HENT: Atraumatic, normocephalic, IJ central line in place, on 3L [place] Lungs: Bibasilar crackles, no wheezing or rhonchi Cardiovascular: Bradycardic, no murmurs, rubs or gallops Abdomen: Soft, non-tender, +BS Extremities: No LE edema, SCDs in place Neuro: Awake, oriented x3, moves all extremities GU: Condom cath in place  Resolved Hospital Problem list    Assessment & Plan:   Acute hypoxic respiratory failure likely secondary to heart failure exacerbation and COPD exacerbation Intubated Presumed OSA -Likely multifactorial, has a history of HFrEF and COPD on 3L [place] at home. He also may have OSA, sleep study being done at [place]. -Patient was extubated yesterday with no issues. He has had total output of -2900 yesterday. CVP today is 7. On exam his still has some bilateral crackles, JVD has improved and does not have any lower  extremity edema.  -Full vent support -Pulmonary hygiene -Scheduled duonebs -Continue steroids x 5 days, switch to prednisone today -Restart home valium and gabapentin -Consider restarting home percocet -Stable to transfer out of ICU -Continue diuresis as below  HFrEF:  Echo in June 2019 showed EF 40-45%, PA peak pressure 62 mmHg. Follows with heart failure service. Echo done yesterday showed EF 40-45%, biatrial enlargement, and no wall motion abnormalities. Similar to last echo. He has had a good response to diuresis, -5,800 cc since admission, and down 10 lbs. Today his CVP is 7 and he still has some bibasilar crackles on exam. We can likely restart his home torsemide today.  -Heart failure following, appreciate recommendations >> Plan for [facility] when more stable, possibly Monday -Strict I+Os, daily weights -Restart home spironolactone, torsemide -Continue crestor and zebeta  Paroxsymal A fibrillation NSTEMI -Troponin elevated to 0.36, did not present with chest. EKG showed dynamic changes.  -Likely 2/2 demand ischemia, repeat troponin was unchanged. Troponins 0.36 > 0.4  -Complete heparin drip x48 hours -Cardiology consult >  suspect demand ischemia.   AKI 2/2 cardiorenal syndrome: -Cr improved to 1.2 today, is resolving.  -Daily BMP  COPD: -Patient is on symbicort and duonebs at home. He is on oxygen 3L at home. Patient does not have any wheezing on exam.  -Continue duonebs scheduled -Continue IV steroids x 5 day, can transition to oral prednisone today  Hyperkalemia: Has been given temporizing measures in the ED -Resolved.  BMP today showed K 4.7.  -BMP daily  DM: -On metformin at home, hold this -SSI -Monitor CBGs  Best practice:  Diet: Heart healthy, fluid restriction Pain/Anxiety/Delirium protocol (if indicated): N/A VAP protocol (if indicated): N/A DVT prophylaxis: Heparin GI prophylaxis: PPI Glucose control: SSI Mobility: Up with assistance Code Status:  Full Family Communication: Spoke with patient Disposition: Transfer out of ICU  Labs   CBC: Recent Labs  Lab 12/01/18 0011 12/01/18 0052 12/01/18 0323 12/02/18 0419 12/02/18 0636 12/03/18 0447  WBC 11.3*  --   --   --  7.0 7.9  NEUTROABS 9.7*  --   --   --   --   --   HGB 9.4* 11.2* 10.9* 10.9* 10.1* 9.1*  HCT 32.5* 33.0* 32.0* 32.0* 32.1* 29.8*  MCV 90.5  --   --   --  82.5 86.1  PLT 328  --   --   --  374 830    Basic Metabolic Panel: Recent Labs  Lab 11/26/18 0839 12/01/18 0011  12/01/18 0408 12/01/18 1555 12/02/18 0342 12/02/18 0419 12/03/18 0447  NA 134* 132*   < > 135 138 140 138 144  K 4.3 6.7*   < > 5.4* 5.3* 4.7 4.8 3.8  CL 92* 93*  --  90* 92* 95*  --  98  CO2 34* 29  --  _0 --  33*  GLUCOSE 254* 83  --  78 159* 167*  --  162*  BUN 33* 94*  --  96* 92* 80*  --  65*  CREATININE 1.06 3.48*  --  3.15* 2.15* 1.56*  --  1.27*  CALCIUM 8.1* 8.1*  --  8.8* 8.8* 9.0  --  8.8*  MG 2.3  --   --  2.9*  --   --   --   --   PHOS  --   --   --  5.5*  --   --   --   --    < > = values in this interval not displayed.   GFR: Estimated Creatinine Clearance: 73 mL/min (A) (by C-G formula based on SCr of 1.27 mg/dL (H)). Recent Labs  Lab 12/01/18 0011 12/02/18 0636 12/03/18 0447  WBC 11.3* 7.0 7.9    Liver Function Tests: No results for input(s): AST, ALT, ALKPHOS, BILITOT, PROT, ALBUMIN in the last 168 hours. No results for input(s): LIPASE, AMYLASE in the last 168 hours. No results for input(s): AMMONIA in the last 168 hours.  ABG    Component Value Date/Time   PHART 7.527 (H) 12/02/2018 0419   PCO2ART 43.4 12/02/2018 0419   PO2ART 95.0 12/02/2018 0419   HCO3 35.8 (H) 12/02/2018 0419   TCO2 37 (H) 12/02/2018 0419   ACIDBASEDEF 2.0 02/19/2015 1003   O2SAT 61.5 12/03/2018 0455     Coagulation Profile: No results for input(s): INR, PROTIME in the last 168 hours.  Cardiac Enzymes: Recent Labs  Lab 12/01/18 0408 12/01/18 1206  TROPONINI 0.36*  0.42*    HbA1C: Hgb A1c MFr Bld  Date/Time Value Ref  Range Status  11/13/2018 06:02 AM 6.5 (H) 4.8 - 5.6 % Final    Comment:    (NOTE) Pre diabetes:          5.7%-6.4% Diabetes:              >6.4% Glycemic control for   <7.0% adults with diabetes   05/01/2018 05:10 AM 8.1 (H) 4.8 - 5.6 % Final    Comment:    (NOTE) Pre diabetes:          5.7%-6.4% Diabetes:              >6.4% Glycemic control for   <7.0% adults with diabetes     CBG: Recent Labs  Lab 12/02/18 1146 12/02/18 1535 12/02/18 2001 12/02/18 2342 12/03/18 0456  GLUCAP 148* 201* 162* 151* 157*    Review of Systems:   Unable to obtain due to patients mental status  Past Medical History  He,  has a past medical history of Atrial flutter ([place]), CAD (coronary artery disease), Chronic pain, Chronic systolic CHF (congestive heart failure) ([place]), COPD (chronic obstructive pulmonary disease) ([place]), Diabetes mellitus without complication ([place]), Hypercholesteremia, Hypertension, NICM (nonischemic cardiomyopathy) ([place]), PAF (paroxysmal atrial fibrillation) ([place]), Pulmonary hypertension ([place]), and Tobacco abuse.   Surgical History    Past Surgical History:  Procedure Laterality Date  . CARDIOVERSION N/A 07/30/2018   Procedure: CARDIOVERSION;  Surgeon: Larey Dresser, MD;  Location[facility] ENDOSCOPY;  Service: Cardiovascular;  Laterality: N/A;  . LEFT AND RIGHT HEART CATHETERIZATION WITH CORONARY ANGIOGRAM N/A 11/06/2014   Procedure: LEFT AND RIGHT HEART CATHETERIZATION WITH CORONARY ANGIOGRAM;  Surgeon: Jettie Booze, MD;  Location: [facility] CATH LAB;  Service: Cardiovascular;  Laterality: N/A;  . SPLENECTOMY       Social History   reports that he has been smoking cigarettes. He has a 34.00 pack-year smoking history. He has never used smokeless tobacco. He reports that he does not drink alcohol or use drugs.   Family History   His family history includes Heart disease in his father and mother; Heart failure in his  mother; Hypertension in his mother and sister. There is no history of Heart attack or Stroke.   Allergies No Known Allergies   Home Medications  Prior to Admission medications   Medication Sig Start Date End Date Taking? Authorizing Provider  bisoprolol (ZEBETA) 5 MG tablet Take 0.5 tablets (2.5 mg total) by mouth daily for 30 days. 11/26/18 12/26/18  Amin, Jeanella Flattery, MD  budesonide-formoterol (SYMBICORT) 160-4.5 MCG/ACT inhaler Inhale 2 puffs into the lungs 2 (two) times daily.    [provider]  diazepam (VALIUM) 10 MG tablet Take 0.5 tablets (5 mg total) by mouth daily as needed for anxiety or sleep. Patient taking differently: Take 10 mg by mouth daily as needed for anxiety or sleep.  12/19/16   Theodis Blaze, MD  ferrous sulfate 325 (65 FE) MG tablet Take 325 mg by mouth 3 (three) times daily as needed (for supplementation).    [provider]  fluticasone (FLONASE) 50 MCG/ACT nasal spray Place 2 sprays into both nostrils daily as needed for allergies.     [provider]  folic acid (FOLVITE) 1 MG tablet Take 1 mg by mouth daily.    [provider]  gabapentin (NEURONTIN) 600 MG tablet Take 1 tablet (600 mg total) by mouth 2 (two) times daily. 12/19/16   Theodis Blaze, MD  glucose 4 GM chewable tablet Chew 4 tablets by mouth See admin instructions.  Chew 4 tablets by mouth as needed for low blood sugar - repeat every 15 minutes if blood sugar less than 70    [provider]  guaifenesin (HUMIBID E) 400 MG TABS tablet Take 1 tablet (400 mg total) by mouth every 6 (six) hours as needed. Patient taking differently: Take 400 mg by mouth every 6 (six) hours as needed (cough).  08/02/18   Regalado, Belkys A, MD  hydrocortisone (ANUSOL-HC) 2.5 % rectal cream Place 1 application rectally 2 (two) times daily as needed for hemorrhoids or itching.     [provider]  hydrocortisone 1 % lotion Apply 1 application topically See admin instructions.  Apply small amount to back twice daily for itchy rash    [provider]  insulin aspart protamine - aspart (NOVOLOG MIX 70/30 FLEXPEN) (70-30) 100 UNIT/ML FlexPen Inject 0.05 mLs (5 Units total) into the skin 2 (two) times daily. Patient taking differently: Inject 30 Units into the skin 2 (two) times daily.  08/02/18   Regalado, Belkys A, MD  ipratropium-albuterol (DUONEB) 0.5-2.5 (3) MG/3ML SOLN Take 3 mLs by nebulization 2 (two) times daily.    [provider]  latanoprost (XALATAN) 0.005 % ophthalmic solution Place 1 drop into both eyes at bedtime.    [provider]  lisinopril (PRINIVIL,ZESTRIL) 10 MG tablet Take 1 tablet (10 mg total) by mouth daily for 30 days. 11/27/18 12/27/18  Damita Lack, MD  Magnesium Oxide 420 (252 Mg) MG TABS Take 420 mg by mouth 2 (two) times daily.    [provider]  metFORMIN (GLUCOPHAGE-XR) 500 MG 24 hr tablet Take 1,000 mg by mouth 2 (two) times daily with a meal.    [provider]  oxyCODONE-acetaminophen (PERCOCET) 10-325 MG tablet Take 1 tablet by mouth every 6 (six) hours as needed for pain.    [provider]  OXYGEN Inhale 3 L into the lungs continuous.     [provider]  potassium chloride SA (K-DUR,KLOR-CON) 20 MEQ tablet Take 2 tablets (40 mEq total) by mouth every morning AND 1 tablet (20 mEq total) at bedtime. 11/26/18   Amin, Jeanella Flattery, MD  rivaroxaban (XARELTO) 20 MG TABS tablet Take 1 tablet (20 mg total) by mouth daily with supper. Patient taking differently: Take 20 mg by mouth daily at 6 PM.  01/20/17   Jettie Booze, MD  rosuvastatin (CRESTOR) 20 MG tablet Take 1 tablet (20 mg total) by mouth daily. 05/07/18   Matilde Haymaker, MD  spironolactone (ALDACTONE) 25 MG tablet Take 1 tablet (25 mg total) by mouth at bedtime. 02/04/18 02/04/19  Shirley Friar, PA-C  torsemide (DEMADEX) 20 MG tablet Take 4 tablets (80 mg total) by mouth 2 (two) times daily for 30 days. 11/26/18  12/26/18  Damita Lack, MD        [name], M.D. PGY1 Pager [phone] 12/03/2018 6:43 AM

## 2018-12-03 NOTE — Care Management Note (Signed)
Case Management Note Nicholas Silvas RN MSN CCM Transitions of Care 28[address] [place] [phone]  Patient Details  Name: Nicholas Caldwell MRN: [id number] Date of Birth: [birth year]  Subjective/Objective:     Acute respiratory failure with hypoxia and hypercapnia             Action/Plan: 12/01/2017-PTA home with spouse and [facility] services (RN, PT-[facility]). Home O2 at 3 l [facility]). PCP-Dr. [name] [place]). CM notified [facility] and Dr. Marjo Bicker of pt readmission. Patient is also followed by HF clinic. Currently intubated and on levophed and fentanyl. Will follow for transition of care needs.   Expected Discharge Date:                  Expected Discharge Plan:  [place]  In-House Referral:  Clinical Social Work  Discharge planning Services  CM Consult  Post Acute Care Choice:    Choice offered to:     DME Arranged:    DME Agency:     HH Arranged:    [place] Agency:     Status of Service:  In process, will continue to follow  If discussed at Long Length of Stay Meetings, dates discussed:    Additional Comments: 12/03/2018-Extubated 12/02/2018-cardiology planning possible R/LHC on Monday-noted per cardiology perspective pt may transfer out of ICU. Will continue to follow for transition of care needs.   Bartholomew Crews, RN 12/03/2018, 12:51 PM

## 2018-12-03 NOTE — Progress Notes (Signed)
Called report to [place], RN on 3E.

## 2018-12-03 NOTE — Progress Notes (Addendum)
[place] for Heparin (Xarelto on hold) Indication: chest pain/ACS, suspected NSTEMI  No Known Allergies  Patient Measurements: Height: 6' (182.9 cm) Weight: 269 lb 2.9 oz (122.1 kg) IBW/kg (Calculated) : 77.6 Heparin Dosing Weight: 106  Vital Signs: Temp: 98.7 F (37.1 C) (01/30 2330) Temp Source: Oral (01/30 2330) BP: 147/104 (01/31 0100) Pulse Rate: 81 (01/31 0203)  Labs: Recent Labs    12/01/18 0011  12/01/18 0323 12/01/18 0408  12/01/18 1206 12/01/18 1555 12/02/18 0342 12/02/18 0419 12/02/18 0636 12/02/18 1336 12/02/18 2330  HGB 9.4*   < > 10.9*  --   --   --   --   --  10.9* 10.1*  --   --   HCT 32.5*   < > 32.0*  --   --   --   --   --  32.0* 32.1*  --   --   PLT 328  --   --   --   --   --   --   --   --  374  --   --   APTT  --   --   --   --   --   --   --  52*  --   --  84* 93*  HEPARINUNFRC  --   --   --   --    < >  --   --  0.67  --   --  0.98* 1.07*  CREATININE 3.48*  --   --  3.15*  --   --  2.15* 1.56*  --   --   --   --   TROPONINI  --   --   --  0.36*  --  0.42*  --   --   --   --   --   --    < > = values in this interval not displayed.    Estimated Creatinine Clearance: 59.5 mL/min (A) (by C-G formula based on SCr of 1.56 mg/dL (H)).   Medical History: Past Medical History:  Diagnosis Date  . Atrial flutter ([place])    a. recurrent AFlutter with RVR;  b. Amiodarone Rx started 4/16  . CAD (coronary artery disease)    a. LHC 1/16:  mLAD diffuse disease, pLCx mild disease, dLCx with disease but too small for PCI, RCA ok, EF 25-30%  . Chronic pain   . Chronic systolic CHF (congestive heart failure) ([place])   . COPD (chronic obstructive pulmonary disease) ([place])   . Diabetes mellitus without complication ([place])   . Hypercholesteremia   . Hypertension   . NICM (nonischemic cardiomyopathy) ([place])    a.dx 2016. b. 2D echo 06/2016 - Last echo 07/01/16: mod dilated LV, mod LVH, EF 25-30%, mild-mod MR, sev LAE, mild-mod  reduced RV systolic function, mild-mod TR, PASP 50mHG.  .Marland KitchenPAF (paroxysmal atrial fibrillation) (HCC)    On amio - ot a candidate for flecainide due to cardiomyopathy, not a candidate for Tikosyn due to prolonged QT, and felt to be a poor candidate for ablation given left atrial size.  . Pulmonary hypertension ([place]   . Tobacco abuse     Medications:  Scheduled:  . bisoprolol  2.5 mg Per Tube Daily  . budesonide (PULMICORT) nebulizer solution  0.5 mg Nebulization BID  . insulin aspart  0-15 Units Subcutaneous Q4H  . ipratropium-albuterol  3 mL Nebulization Q6H  . mouth rinse  15 mL Mouth Rinse BID  . methylPREDNISolone (  SOLU-MEDROL) injection  60 mg Intravenous Daily  . pantoprazole (PROTONIX) IV  40 mg Intravenous QHS  . rosuvastatin  20 mg Per Tube Daily    Assessment: 73 yo with hx of Afib (on xarleto PTA, last dose PTA ~1800 on 0/21), COPD, systolic CHF, chronic pain. Presented with lethargy, hypotension and bradycardia. Patient was intubated for airway protection / acute hypoxic / hypercarbic respiratory failure.  -Patients tropoinin elevated at 0.32 > 0.36. -Hgb slightly low at 10.9, Hct 32, and platelets wnl at 328. -Baseline heparin level elevated, currently using aPTT to dose heparin for now  1/30 AM update: aPTT is sub-therapeutic at 52, CBC stable  1/31 AM update: aPTT is now therapeutic x 2, heparin level and aPTT are still not correlating   Goal of Therapy:  Heparin level 0.3-0.7 units/ml aPTT 66-102 seconds Monitor platelets by anticoagulation protocol: Yes   Plan:  Cont heparin at 1250 units/hr Daily CBC/aPTT/HL  Narda Bonds, PharmD, BCPS Clinical Pharmacist Phone: [phone]

## 2018-12-03 NOTE — Progress Notes (Signed)
Pt has transferred orders for tele.  Bed assignment received for 3E.  Current order for CVP monitoring but 3E cannot monitor CVP.  Paged Cards to determine if CVP still needed.  Received call back from Dr. [place] stating that CVP is no longer needed.  Order for CVP monitoring discontinued. Will transfer pt to 3E09

## 2018-12-03 NOTE — Progress Notes (Addendum)
Advanced Heart Failure Rounding Note  PCP-Cardiologist: No primary care provider on file.   Subjective:    Negative 2.9 L. No weight today. CVP 5-6 cm   Wide awake and alert today. Extubated. Feeling OK today. Atrial fibrillation noted started around 2230 last night. He was previously taken off of amiodarone for prolonged QT.   Cr 3.15 -> 2.15 -> 1.56 -> 1.27. K 3.8  Objective:   Weight Range: 122.1 kg Body mass index is 36.51 kg/m.   Vital Signs:   Temp:  [98.3 F (36.8 C)-100.4 F (38 C)] 98.3 F (36.8 C) (01/31 0727) Pulse Rate:  [62-117] 82 (01/31 1000) Resp:  [14-24] 18 (01/31 1000) BP: (75-165)/(44-131) 117/68 (01/31 1000) SpO2:  [89 %-100 %] 97 % (01/31 1000) FiO2 (%):  [40 %] 40 % (01/30 1114) Last BM Date: 12/02/18  Weight change: Filed Weights   12/01/18 0009 12/01/18 0416 12/02/18 0309  Weight: 56 kg 127 kg 122.1 kg   Intake/Output:   Intake/Output Summary (Last 24 hours) at 12/03/2018 1027 Last data filed at 12/03/2018 1000 Gross per 24 hour  Intake 757.03 ml  Output 2950 ml  Net -2192.97 ml    Physical Exam    General: NAD HEENT: Normal Neck: Supple. JVP 5-6. Carotids 2+ bilat; no bruits. No thyromegaly or nodule noted. Cor: PMI nondisplaced. Regular on exam (AFL as below) Lungs: Diminished basilar sounds.  Abdomen: Soft, non-tender, non-distended, no HSM. No bruits or masses. +BS  Extremities: No cyanosis, clubbing, or rash. R and LLE no edema.  Neuro: Alert & orientedx3, cranial nerves grossly intact. moves all 4 extremities w/o difficulty. Affect pleasant   Telemetry   Aflutter with controlled VR in 80s, personally reviewed.   EKG   No new tracings.    Labs    CBC Recent Labs    12/01/18 0011  12/02/18 0636 12/03/18 0447  WBC 11.3*  --  7.0 7.9  NEUTROABS 9.7*  --   --   --   HGB 9.4*   < > 10.1* 9.1*  HCT 32.5*   < > 32.1* 29.8*  MCV 90.5  --  82.5 86.1  PLT 328  --  374 315   < > = values in this interval not  displayed.   Basic Metabolic Panel Recent Labs    12/01/18 0408  12/02/18 0342 12/02/18 0419 12/03/18 0447  NA 135   < > 140 138 144  K 5.4*   < > 4.7 4.8 3.8  CL 90*   < > 95*  --  98  CO2 26   < > 31  --  33*  GLUCOSE 78   < > 167*  --  162*  BUN 96*   < > 80*  --  65*  CREATININE 3.15*   < > 1.56*  --  1.27*  CALCIUM 8.8*   < > 9.0  --  8.8*  MG 2.9*  --   --   --   --   PHOS 5.5*  --   --   --   --    < > = values in this interval not displayed.   Liver Function Tests No results for input(s): AST, ALT, ALKPHOS, BILITOT, PROT, ALBUMIN in the last 72 hours. No results for input(s): LIPASE, AMYLASE in the last 72 hours. Cardiac Enzymes Recent Labs    12/01/18 0408 12/01/18 1206  TROPONINI 0.36* 0.42*    BNP: BNP (last 3 results) Recent Labs  11/16/18 0237 11/21/18 2238 12/01/18 0015  BNP 606.1* 1,739.7* 1,666.7*    ProBNP (last 3 results) No results for input(s): PROBNP in the last 8760 hours.   D-Dimer No results for input(s): DDIMER in the last 72 hours. Hemoglobin A1C No results for input(s): HGBA1C in the last 72 hours. Fasting Lipid Panel Recent Labs    12/01/18 0408  TRIG 116   Thyroid Function Tests No results for input(s): TSH, T4TOTAL, T3FREE, THYROIDAB in the last 72 hours.  Invalid input(s): FREET3  Other results:   Imaging    No results found.   Medications:     Scheduled Medications: . bisoprolol  2.5 mg Per Tube Daily  . budesonide (PULMICORT) nebulizer solution  0.5 mg Nebulization BID  . gabapentin  600 mg Oral BID  . insulin aspart  0-15 Units Subcutaneous Q4H  . ipratropium-albuterol  3 mL Nebulization Q6H  . mouth rinse  15 mL Mouth Rinse BID  . methylPREDNISolone (SOLU-MEDROL) injection  60 mg Intravenous Daily  . pantoprazole (PROTONIX) IV  40 mg Intravenous QHS  . rosuvastatin  20 mg Per Tube Daily    Infusions: . sodium chloride    . dexmedetomidine (PRECEDEX) IV infusion Stopped (12/02/18 1021)  .  fentaNYL infusion INTRAVENOUS Stopped (12/02/18 1347)  . heparin 1,250 Units/hr (12/03/18 1000)  . norepinephrine (LEVOPHED) Adult infusion Stopped (12/01/18 2217)  . propofol (DIPRIVAN) infusion Stopped (12/01/18 1033)    PRN Medications: albuterol, diazepam, docusate sodium, fentaNYL    Patient Profile   Nicholas Caldwell is a 73 y.o. male with PMH of chronic systolic CHF/nonischemic cardiomyopathy Echo 12/13/2016 LVEF 40-45%, COPD on home oxygen, PAFs/p DCCV 2018,and history of non compliance.  Admitted overnight with acute respiratory failure, acute renal failure, and hypotension.   Assessment/Plan   1. Acute on chronic hypercarbic/hypoxemic respiratory failure: He is on home oxygen at baseline.  Patient was intubated at admission.  Initial lesion uncertain.  He initially appeared volume overloaded on exam with pulmonary edema on CXR, so this certainly plays a role.  Also concern for oversedation from home meds. No active wheeze on exam.  - Getting steroids with history of COPD.  - He is extubated and on [place] at 3 lpm.  - Will need eventual sleep study. Was scheduled for 11/30/2018. - Diuretics as a below. 2. Acute on chronic systolic CHF: Echo in 2/75 with EF 40-45%, mild RV dilation.  Nonischemic cardiomyopathy.  He was admitted with hypotension and was initially on norepinephrine.  Pulmonary edema on initial CXR and volume overloaded on exam.  Also with AKI at admission.  - Echo 12/02/2018 LVEF stable at 40-45%.  - Considering [facility] Monday with rapid decline. Though EF stable as above.  - Weight pending. Weight down at least 10 lbs with IV lasix. CVP remains ~ 5 cm.  - Resume torsemide 80 mg BID starting this evening.  - Coox 61.5%.  - BP running up into 140-160s.  - Holding lisinopril and spironolactone with AKI. Now improving.  - Having more PVCs off of his BB.  BP running 140-160s. Increase bisoprolol up to 5 mg daily.  3. AKI: Baseline creatinine 1.0.  - Creatinine down to 1.27  with diuresis.  - Holding lisinopril and spironolactone with AKI, which is now improving.  4. Shock/hypotension: Presented with low BP. No fever and WBCs near normal.  - Unclear at this time what caused his presentation.  - Now off NE - Coox 84%. CVP 5-6  5. COPD:  - PEr CCM.  No wheeze on exam.  6. Atrial flutter; recurrent.  - Likely in setting of acute illness.  - He has previously failed amiodarone due to Prolonged QT. Not tikosyn candidate for same reason. Will discuss with MD, may need EP to see - Xarelto on hold in case procedure required.  7. Elevated troponin: Mild troponin elevation with no trend.   - Likely demand ischemia with volume overload/hypotension.  - Plan R/LHC once stable.   Much improved today. Can move out of ICU.  Tentatively plan [facility] Monday.   Medication concerns reviewed with patient and pharmacy team. Barriers identified: None at this time.   Length of Stay: 2  Annamaria Helling  12/03/2018, 10:27 AM  Advanced Heart Failure Team Pager [phone] (M-F; 7a - 4p)  Please contact [facility] for night-coverage after hours (4p -7a ) and weekends on [email or web address]   Patient seen with PA, agree with the above note.    He is extubated now, CVP 4-5 with good diuresis yesterday.  BUN/creatinine trending down steadily.  SBP running 130s-140s.  He went into atrial fibrillation with relatively controlled rate overnight.   Repeat echo with EF stable 40-45%.    On exam, no JVD.  Mild crackles dependently on lung exam.  No edema.  Irregular S1S2.   Creatinine trending down, continue to hold ACEI and spironolactone.   BP now running high.  Ok to increase bisprolol to 5 mg daily for rate control and add Bidil 1/2 tab tid for BP.   With episode of ?flash pulmonary edema and fairly rapid improvement, think it would be reasonable to have him do right/left heart cath when creatinine has recovered to baseline.  ?Monday.   Loralie Champagne 12/03/2018 4:04 PM

## 2018-12-03 NOTE — Evaluation (Signed)
Physical Therapy Evaluation Patient Details Name: Nicholas Caldwell MRN: [id number] DOB: 02-May-1946 Today's Date: 12/03/2018   History of Present Illness  60M who initially presented with hypotension and lethargy and found with acute renal failure with hyperkalemia. Respiratory status worsened. Started on BiPAP however mental status poor and subsequently intubated for acute hypercarbic and hypoxemic respiratory failure PMH includes but not limited to: COPD, CHF, CAD, A flutter, PAF, cardioversion 9/19.     Clinical Impression  Pt admitted with above diagnosis. Pt currently with functional limitations due to the deficits listed below (see PT Problem List). PTA, pt living at home with his wife, reports some falls with standing up, has been working with [place]. Today ambulating 9' with close chair follow, overall min guard at most with OOB mobility. SpO2 WNL on 4L, HR 110-130 with PVCS noted RN aware. Will progress patient to safely return home with HHPT.  Pt will benefit from skilled PT to increase their independence and safety with mobility to allow discharge to the venue listed below.       Follow Up Recommendations Home health PT;Supervision/Assistance - 24 hour    Equipment Recommendations  None recommended by PT    Recommendations for Other Services       Precautions / Restrictions Precautions Precautions: Fall Precaution Comments: monitor SPO2 Restrictions Weight Bearing Restrictions: No      Mobility  Bed Mobility Overal bed mobility: Modified Independent                Transfers Overall transfer level: Needs assistance Equipment used: Rolling walker (2 wheeled) Transfers: Sit to/from [facility] Sit to Stand: Supervision Stand pivot transfers: Supervision       General transfer comment: Pt declined  Ambulation/Gait Ambulation/Gait assistance: Min guard Gait Distance (Feet): 50 Feet Assistive device: Rolling walker (2 wheeled) Gait  Pattern/deviations: Step-through pattern;Decreased stride length;Trunk flexed Gait velocity: decreased   General Gait Details: ambulating hallway with chair follow. SpO2 WNL on 4L. no overt LOB use of RW heavy reliance, HR 110-130 with PVCS noted, RN aware.   Stairs            Wheelchair Mobility    Modified Rankin (Stroke Patients Only)       Balance Overall balance assessment: Needs assistance Sitting-balance support: No upper extremity supported;Feet supported Sitting balance-Leahy Scale: Good     Standing balance support: During functional activity;Bilateral upper extremity supported Standing balance-Leahy Scale: Poor                               Pertinent Vitals/Pain Pain Assessment: Faces Faces Pain Scale: Hurts a little bit Pain Location: R hip Pain Descriptors / Indicators: Discomfort;Grimacing;Constant Pain Intervention(s): Limited activity within patient's tolerance;Monitored during session    [place] expects to be discharged to:: Private residence Living Arrangements: Spouse/significant other Available Help at Discharge: Family;Available 24 hours/day Type of Home: House Home Access: Stairs to enter Entrance Stairs-Rails: None Entrance Stairs-Number of Steps: 1 Home Layout: One level Home Equipment: Walker - 2 wheels;Cane - single point;Bedside commode;Shower seat Additional Comments: on 3L of O2 at all times at home    Prior Function Level of Independence: Needs assistance   Gait / Transfers Assistance Needed: uses RW for mobility  ADL's / Homemaking Assistance Needed: assist from wife for LB ADLs and IADLs  Comments: reports some falls due BP drops when standing     Hand Dominance   Dominant Hand: Right  Extremity/Trunk Assessment   Upper Extremity Assessment Upper Extremity Assessment: Generalized weakness    Lower Extremity Assessment Lower Extremity Assessment: Generalized weakness        Communication   Communication: No difficulties  Cognition Arousal/Alertness: Awake/alert Behavior During Therapy: Flat affect Overall Cognitive Status: Within Functional Limits for tasks assessed                                        General Comments      Exercises     Assessment/Plan    PT Assessment Patient needs continued PT services  PT Problem List Decreased strength;Decreased activity tolerance;Decreased balance;Decreased coordination;Decreased mobility;Decreased knowledge of use of DME;Decreased safety awareness;Decreased knowledge of precautions;Pain       PT Treatment Interventions DME instruction;Gait training;Stair training;Functional mobility training;Therapeutic activities;Balance training;Therapeutic exercise;Neuromuscular re-education;Patient/family education    PT Goals (Current goals can be found in the Care Plan section)  Acute Rehab PT Goals Patient Stated Goal: to go home soon PT Goal Formulation: With patient Time For Goal Achievement: 12/17/18 Potential to Achieve Goals: Good    Frequency Min 3X/week   Barriers to discharge        Co-evaluation               AM-PAC PT "6 Clicks" Mobility  Outcome Measure Help needed turning from your back to your side while in a flat bed without using bedrails?: None Help needed moving from lying on your back to sitting on the side of a flat bed without using bedrails?: None Help needed moving to and from a bed to a chair (including a wheelchair)?: A Little Help needed standing up from a chair using your arms (e.g., wheelchair or bedside chair)?: A Lot Help needed to walk in hospital room?: A Lot Help needed climbing 3-5 steps with a railing? : A Lot 6 Click Score: 17    End of Session Equipment Utilized During Treatment: Oxygen Activity Tolerance: Patient tolerated treatment well Patient left: in bed;with call bell/phone within reach;with bed alarm set Nurse Communication: Mobility  status PT Visit Diagnosis: Other abnormalities of gait and mobility (R26.89);Muscle weakness (generalized) (M62.81)    Time: 5993-5701 PT Time Calculation (min) (ACUTE ONLY): 60 min   Charges:   PT Evaluation $PT Eval Moderate Complexity: 1 Mod PT Treatments $Gait Training: 23-37 mins       Reinaldo Berber, PT, DPT Acute Rehabilitation Services Pager: [phone] Office: [phone]    Reinaldo Berber 12/03/2018, 5:02 PM

## 2018-12-03 NOTE — Progress Notes (Signed)
Pt transported via bed, monitor, O2 4L with no complications.  Bedside update given to [facility], Therapist, sports.

## 2018-12-03 NOTE — Care Management Important Message (Signed)
Important Message  Patient Details  Name: Nicholas Caldwell MRN: [id number] Date of Birth: [birth year]   Medicare Important Message Given:       [name] 12/03/2018, 2:28 PM

## 2018-12-04 LAB — BASIC METABOLIC PANEL
Anion gap: 11 (ref 5–15)
BUN: 56 mg/dL — ABNORMAL HIGH (ref 8–23)
CALCIUM: 8.5 mg/dL — AB (ref 8.9–10.3)
CO2: 35 mmol/L — ABNORMAL HIGH (ref 22–32)
Chloride: 97 mmol/L — ABNORMAL LOW (ref 98–111)
Creatinine, Ser: 1.36 mg/dL — ABNORMAL HIGH (ref 0.61–1.24)
GFR calc Af Amer: >60 mL/min (ref >60)
GFR calc non Af Amer: 52 mL/min — ABNORMAL LOW (ref >60)
Glucose, Bld: 183 mg/dL — ABNORMAL HIGH (ref 70–99)
Potassium: 3.5 mmol/L (ref 3.5–5.1)
Sodium: 143 mmol/L (ref 135–145)

## 2018-12-04 LAB — GLUCOSE, CAPILLARY
Glucose-Capillary: 191 mg/dL — ABNORMAL HIGH (ref 70–99)
Glucose-Capillary: 195 mg/dL — ABNORMAL HIGH (ref 70–99)
Glucose-Capillary: 204 mg/dL — ABNORMAL HIGH (ref 70–99)
Glucose-Capillary: 222 mg/dL — ABNORMAL HIGH (ref 70–99)
Glucose-Capillary: 228 mg/dL — ABNORMAL HIGH (ref 70–99)
Glucose-Capillary: 284 mg/dL — ABNORMAL HIGH (ref 70–99)

## 2018-12-04 LAB — APTT: aPTT: 91 seconds — ABNORMAL HIGH (ref 24–36)

## 2018-12-04 LAB — CBC
HCT: 29.6 % — ABNORMAL LOW (ref 39.0–52.0)
Hemoglobin: 9.1 g/dL — ABNORMAL LOW (ref 13.0–17.0)
MCH: 26.7 pg (ref 26.0–34.0)
MCHC: 30.7 g/dL (ref 30.0–36.0)
MCV: 86.8 fL (ref 80.0–100.0)
Platelets: 306 10*3/uL (ref 150–400)
RBC: 3.41 MIL/uL — ABNORMAL LOW (ref 4.22–5.81)
RDW: 17.2 % — ABNORMAL HIGH (ref 11.5–15.5)
WBC: 8.9 10*3/uL (ref 4.0–10.5)
nRBC: 0 % (ref 0.0–0.2)

## 2018-12-04 LAB — CULTURE, RESPIRATORY W GRAM STAIN: Culture: NORMAL

## 2018-12-04 LAB — COOXEMETRY PANEL
Carboxyhemoglobin: 1.3 % (ref 0.5–1.5)
METHEMOGLOBIN: 1.5 % (ref 0.0–1.5)
O2 Saturation: 66.4 %
Total hemoglobin: 10.5 g/dL — ABNORMAL LOW (ref 12.0–16.0)

## 2018-12-04 LAB — HEPARIN LEVEL (UNFRACTIONATED): Heparin Unfractionated: 0.75 IU/mL — ABNORMAL HIGH (ref 0.30–0.70)

## 2018-12-04 MED ORDER — DIAZEPAM 5 MG PO TABS
10.0000 mg | ORAL_TABLET | Freq: Every day | ORAL | Status: DC
  Administered 2018-12-04 – 2018-12-12 (×9): 10 mg via ORAL
  Filled 2018-12-04 (×9): qty 2

## 2018-12-04 MED ORDER — POTASSIUM CHLORIDE CRYS ER 20 MEQ PO TBCR
40.0000 meq | EXTENDED_RELEASE_TABLET | Freq: Once | ORAL | Status: AC
  Administered 2018-12-04: 40 meq via ORAL

## 2018-12-04 MED ORDER — POTASSIUM CHLORIDE CRYS ER 20 MEQ PO TBCR
40.0000 meq | EXTENDED_RELEASE_TABLET | Freq: Once | ORAL | Status: AC
  Filled 2018-12-04: qty 2

## 2018-12-04 MED ORDER — GABAPENTIN 600 MG PO TABS
300.0000 mg | ORAL_TABLET | Freq: Two times a day (BID) | ORAL | Status: DC
  Administered 2018-12-04 – 2018-12-12 (×17): 300 mg via ORAL
  Filled 2018-12-04 (×17): qty 1

## 2018-12-04 MED ORDER — SODIUM CHLORIDE 0.9% FLUSH
10.0000 mL | INTRAVENOUS | Status: DC | PRN
  Administered 2018-12-07: 10 mL
  Filled 2018-12-04: qty 40

## 2018-12-04 MED ORDER — DM-GUAIFENESIN ER 30-600 MG PO TB12
1.0000 | ORAL_TABLET | Freq: Two times a day (BID) | ORAL | Status: DC
  Administered 2018-12-04 – 2018-12-05 (×4): 1 via ORAL
  Filled 2018-12-04 (×4): qty 1

## 2018-12-04 MED ORDER — MOMETASONE FURO-FORMOTEROL FUM 200-5 MCG/ACT IN AERO
2.0000 | INHALATION_SPRAY | Freq: Two times a day (BID) | RESPIRATORY_TRACT | Status: DC
  Administered 2018-12-04 – 2018-12-12 (×16): 2 via RESPIRATORY_TRACT
  Filled 2018-12-04: qty 8.8

## 2018-12-04 MED ORDER — BISOPROLOL FUMARATE 5 MG PO TABS
5.0000 mg | ORAL_TABLET | Freq: Every day | ORAL | Status: DC
  Administered 2018-12-04 – 2018-12-12 (×8): 5 mg via ORAL
  Filled 2018-12-04 (×9): qty 1

## 2018-12-04 MED ORDER — ISOSORB DINITRATE-HYDRALAZINE 20-37.5 MG PO TABS
1.0000 | ORAL_TABLET | Freq: Three times a day (TID) | ORAL | Status: DC
  Administered 2018-12-04 – 2018-12-12 (×23): 1 via ORAL
  Filled 2018-12-04 (×23): qty 1

## 2018-12-04 MED ORDER — INSULIN ASPART 100 UNIT/ML ~~LOC~~ SOLN
0.0000 [IU] | Freq: Three times a day (TID) | SUBCUTANEOUS | Status: DC
  Administered 2018-12-04: 3 [IU] via SUBCUTANEOUS
  Administered 2018-12-04 – 2018-12-05 (×2): 8 [IU] via SUBCUTANEOUS
  Administered 2018-12-05: 3 [IU] via SUBCUTANEOUS
  Administered 2018-12-05: 5 [IU] via SUBCUTANEOUS
  Administered 2018-12-06: 3 [IU] via SUBCUTANEOUS
  Administered 2018-12-06 – 2018-12-07 (×2): 5 [IU] via SUBCUTANEOUS
  Administered 2018-12-07 – 2018-12-08 (×2): 3 [IU] via SUBCUTANEOUS
  Administered 2018-12-08: 5 [IU] via SUBCUTANEOUS
  Administered 2018-12-09 – 2018-12-10 (×4): 3 [IU] via SUBCUTANEOUS
  Administered 2018-12-10: 2 [IU] via SUBCUTANEOUS
  Administered 2018-12-10: 3 [IU] via SUBCUTANEOUS
  Administered 2018-12-11: 5 [IU] via SUBCUTANEOUS
  Administered 2018-12-11: 3 [IU] via SUBCUTANEOUS
  Administered 2018-12-11: 2 [IU] via SUBCUTANEOUS
  Administered 2018-12-12: 3 [IU] via SUBCUTANEOUS

## 2018-12-04 NOTE — Evaluation (Signed)
Occupational Therapy Evaluation Patient Details Name: Nicholas Caldwell MRN: [id number] DOB: 1946/03/21 Today's Date: 12/04/2018    History of Present Illness 40M who initially presented with hypotension and lethargy and found with acute renal failure with hyperkalemia. Respiratory status worsened. Started on BiPAP however mental status poor and subsequently intubated for acute hypercarbic and hypoxemic respiratory failure PMH includes but not limited to: COPD, CHF, CAD, A flutter, PAF, cardioversion 9/19.    Clinical Impression   Pt admitted with the above diagnoses and presents with below problem list. Pt will benefit from continued acute OT to address the below listed deficits and maximize independence with basic ADLs prior to d/c home. PTA pt was mod I with functional mobility and transfers, some assist needed with LB ADLs, and IADLs. Pt is currently supervision to min guard with functional transfers and mobility, min to max A with LB ADLs.      Follow Up Recommendations  Home health OT;Supervision/Assistance - 24 hour    Equipment Recommendations  None recommended by OT    Recommendations for Other Services       Precautions / Restrictions Precautions Precautions: Fall Precaution Comments: monitor SPO2 Restrictions Weight Bearing Restrictions: No      Mobility Bed Mobility               General bed mobility comments: pt up in recliner upon arrival  Transfers Overall transfer level: Needs assistance Equipment used: Rolling walker (2 wheeled) Transfers: Sit to/from Stand Sit to Stand: Min guard              Balance Overall balance assessment: Needs assistance Sitting-balance support: No upper extremity supported;Feet supported Sitting balance-Leahy Scale: Good     Standing balance support: During functional activity;Bilateral upper extremity supported Standing balance-Leahy Scale: Poor Standing balance comment: reliant on UE support                            ADL either performed or assessed with clinical judgement   ADL Overall ADL's : Needs assistance/impaired Eating/Feeding: Set up;Sitting   Grooming: Min guard;Wash/dry hands;Standing   Upper Body Bathing: Supervision/ safety;Set up;Sitting   Lower Body Bathing: Moderate assistance;Sit to/from stand   Upper Body Dressing : Sitting;Supervision/safety;Set up   Lower Body Dressing: Maximal assistance;Sit to/from stand   Toilet Transfer: Ambulation;RW;Min guard   Toileting- Water quality scientist and Hygiene: Min guard;Sit to/from stand   Tub/ Shower Transfer: Min guard   Functional mobility during ADLs: Min guard;Rolling walker General ADL Comments: Pt completed in-room functional mobility, grooming task standing at sink.      Vision         Perception     Praxis      Pertinent Vitals/Pain Pain Assessment: Faces Faces Pain Scale: Hurts a little bit Pain Location: unspecificed, grimacing with transfer Pain Descriptors / Indicators: Discomfort;Grimacing;Constant Pain Intervention(s): Limited activity within patient's tolerance;Monitored during session     Hand Dominance Right   Extremity/Trunk Assessment Upper Extremity Assessment Upper Extremity Assessment: Generalized weakness   Lower Extremity Assessment Lower Extremity Assessment: Defer to PT evaluation   Cervical / Trunk Assessment Cervical / Trunk Assessment: Other exceptions Cervical / Trunk Exceptions: Increased body habitus   Communication Communication Communication: No difficulties   Cognition Arousal/Alertness: Awake/alert Behavior During Therapy: Flat affect Overall Cognitive Status: Within Functional Limits for tasks assessed  General Comments       Exercises     Shoulder Instructions      Home Living Family/patient expects to be discharged to:: Private residence Living Arrangements: Spouse/significant other Available Help at  Discharge: Family;Available 24 hours/day Type of Home: House Home Access: Stairs to enter [employer] of Steps: 1 Entrance Stairs-Rails: None Home Layout: One level     Bathroom Shower/Tub: Teacher, early years/pre: Standard Bathroom Accessibility: Yes   Home Equipment: Environmental consultant - 2 wheels;Cane - single point;Bedside commode;Shower seat   Additional Comments: on 3L of O2 at all times at home      Prior Functioning/Environment Level of Independence: Needs assistance  Gait / Transfers Assistance Needed: uses RW for mobility ADL's / Homemaking Assistance Needed: assist from wife for LB ADLs and IADLs   Comments: reports some falls due BP drops when standing        OT Problem List: Decreased strength;Decreased range of motion;Decreased activity tolerance;Impaired balance (sitting and/or standing);Decreased knowledge of use of DME or AE;Decreased knowledge of precautions;Pain;Obesity      OT Treatment/Interventions: Self-care/ADL training;Therapeutic exercise;Energy conservation;DME and/or AE instruction;Therapeutic activities;Patient/family education    OT Goals(Current goals can be found in the care plan section) Acute Rehab OT Goals Patient Stated Goal: to go home soon OT Goal Formulation: With patient Time For Goal Achievement: 12/18/18 Potential to Achieve Goals: Good ADL Goals Pt Will Perform Lower Body Bathing: with min assist;with adaptive equipment;sit to/from stand Pt Will Perform Lower Body Dressing: with modified independence;sit to/from stand Pt Will Transfer to Toilet: with modified independence;ambulating Pt Will Perform Toileting - Clothing Manipulation and hygiene: with modified independence;sit to/from stand  OT Frequency: Min 2X/week   Barriers to D/C:            Co-evaluation              AM-PAC OT "6 Clicks" Daily Activity     Outcome Measure Help from another person eating meals?: None Help from another person taking care  of personal grooming?: None Help from another person toileting, which includes using toliet, bedpan, or urinal?: A Little Help from another person bathing (including washing, rinsing, drying)?: A Lot Help from another person to put on and taking off regular upper body clothing?: A Little Help from another person to put on and taking off regular lower body clothing?: A Lot 6 Click Score: 18   End of Session Equipment Utilized During Treatment: Rolling walker;Oxygen  Activity Tolerance: Patient tolerated treatment well Patient left: in chair;with call bell/phone within reach  OT Visit Diagnosis: Unsteadiness on feet (R26.81);Other abnormalities of gait and mobility (R26.89);Muscle weakness (generalized) (M62.81);Pain                Time: 0272-5366 OT Time Calculation (min): 14 min Charges:  OT General Charges $OT Visit: 1 Visit OT Evaluation $OT Eval Low Complexity: [place], OT Acute Rehabilitation Services Pager: [phone] Office: [phone]   Hortencia Pilar 12/04/2018, 3:35 PM

## 2018-12-04 NOTE — Progress Notes (Signed)
Nicholas Caldwell for Heparin (Xarelto on hold) Indication: chest pain/ACS, suspected NSTEMI  No Known Allergies  Patient Measurements: Height: 6' (182.9 cm) Weight: 262 lb 1.6 oz (118.9 kg) IBW/kg (Calculated) : 77.6 Heparin Dosing Weight: 106  Vital Signs: Temp: 98.4 F (36.9 C) (02/01 0652) Temp Source: Oral (02/01 0652) BP: 116/78 (02/01 0652) Pulse Rate: 88 (02/01 0652)  Labs: Recent Labs    12/01/18 1206  12/02/18 0342  12/02/18 0636  12/02/18 2330 12/03/18 0447 12/04/18 0630  HGB  --   --   --    < > 10.1*  --   --  9.1* 9.1*  HCT  --   --   --    < > 32.1*  --   --  29.8* 29.6*  PLT  --   --   --   --  374  --   --  315 306  APTT  --   --  52*  --   --    < > 93* 91* 91*  HEPARINUNFRC  --   --  0.67  --   --    < > 1.07* 0.87* 0.75*  CREATININE  --    < > 1.56*  --   --   --   --  1.27* 1.36*  TROPONINI 0.42*  --   --   --   --   --   --   --   --    < > = values in this interval not displayed.    Estimated Creatinine Clearance: 67.3 mL/min (A) (by C-G formula based on SCr of 1.36 mg/dL (H)).   Medical History: Past Medical History:  Diagnosis Date  . Atrial flutter ([place])    a. recurrent AFlutter with RVR;  b. Amiodarone Rx started 4/16  . CAD (coronary artery disease)    a. LHC 1/16:  mLAD diffuse disease, pLCx mild disease, dLCx with disease but too small for PCI, RCA ok, EF 25-30%  . Chronic pain   . Chronic systolic CHF (congestive heart failure) ([place])   . COPD (chronic obstructive pulmonary disease) ([place])   . Diabetes mellitus without complication ([place])   . Hypercholesteremia   . Hypertension   . NICM (nonischemic cardiomyopathy) ([place])    a.dx 2016. b. 2D echo 06/2016 - Last echo 07/01/16: mod dilated LV, mod LVH, EF 25-30%, mild-mod MR, sev LAE, mild-mod reduced RV systolic function, mild-mod TR, PASP 63mHG.  .Marland KitchenPAF (paroxysmal atrial fibrillation) (HCC)    On amio - ot a candidate for flecainide due to cardiomyopathy,  not a candidate for Tikosyn due to prolonged QT, and felt to be a poor candidate for ablation given left atrial size.  . Pulmonary hypertension ([place]   . Tobacco abuse     Medications:  Scheduled:  . bisoprolol  5 mg Per Tube Daily  . budesonide (PULMICORT) nebulizer solution  0.5 mg Nebulization BID  . insulin aspart  0-15 Units Subcutaneous Q4H  . ipratropium-albuterol  3 mL Nebulization Q6H  . isosorbide-hydrALAZINE  0.5 tablet Oral TID  . mouth rinse  15 mL Mouth Rinse BID  . pantoprazole (PROTONIX) IV  40 mg Intravenous QHS  . predniSONE  40 mg Oral Q breakfast  . rosuvastatin  20 mg Per Tube Daily  . torsemide  60 mg Oral BID    Assessment: 73yo with hx of Afib (on xarleto PTA, last dose PTA ~1800 on 12/87, COPD, systolic CHF, chronic pain. Presented  with lethargy, hypotension and bradycardia. Patient was intubated for airway protection / acute hypoxic / hypercarbic respiratory failure. Baseline heparin level elevated, currently using aPTT to dose heparin for now  This morning aPTT remains therapeutic, but heparin level and aPTT are still not correlating. CBC remains low/stable. No s/sx of bleeding noted.  Goal of Therapy:  Heparin level 0.3-0.7 units/ml aPTT 66-102 seconds Monitor platelets by anticoagulation protocol: Yes   Plan:  Continue heparin infusion at 1250 units/hr Daily CBC/aPTT/HL Monitor for s/sx of bleeding  Thank you for allowing pharmacy to be a part of this patient's care.  Leron Croak, PharmD PGY1 Pharmacy Resident Phone: [phone]  Please check AMION for all [place] phone numbers

## 2018-12-04 NOTE — Progress Notes (Signed)
PROGRESS NOTE    Nicholas Caldwell  OZH:[id number] DOB: [birth year] DOA: 11/30/2018 PCP: [facility]     Brief Narrative:  Nicholas Caldwell is a 73 yo with hx of A fib, COPD, systolic CHF, and chronic pain reportedly took extra pain medications before going to bed.  Afterward his wife was worried that he was getting too sleepy and shaking, and that his blood pressure might be too low.  He was brought to the ER and intubated due to respiratory failure and airway protection.  He was found to have hyperkalemia and acute renal failure.  He takes valium, neurontin, and percocet at home.  He was consulted for acute on chronic systolic heart failure.  He was eventually extubated and transferred to [facility] 2/1.   New events last 24 hours / Subjective: Wondering about his Valium and gabapentin.  He apparently takes Valium 10 mg daily for his seizure disorder.  Takes gabapentin for neuropathy.  He denies any worsening shortness of breath, chest pain, abdominal pain.  Assessment & Plan:   Active Problems:   Acute pulmonary edema (HCC)   Acute respiratory failure with hypoxia and hypercapnia (HCC)   AKI (acute kidney injury) ([place])   Acute on chronic hypoxemic and hypercapnic respiratory failure secondary to COPD, undiagnosed OSA, polypharmacy -Has been extubated and now on his baseline nasal cannula O2 -Had outpatient sleep study scheduled for 1/28 which was canceled due to hospitalization.  Will need to reschedule upon discharge home  Acute on chronic systolic heart failure -EF 40 to 45% -Central line in place to follow Coox/CVP -Heart failure team following, planning for heart cath on Monday -Bisoprolol resumed -Holding spironolactone, lisinopril due to acute kidney injury -Continue torsemide  CAD -Continue BiDil  COPD exacerbation -Continue Dulera, prednisone  Shock -?Cardiogenic  -Resolved.  Bisoprolol resumed  Acute toxic encephalopathy -Likely secondary to polypharmacy and  respiratory failure -Resolved and back to baseline  Acute kidney injury on CKD stage III -Baseline creatinine 1 -Creatinine on admission 3.48 and trending downward  Paroxysmal atrial fibrillation -Hold Xarelto, on IV heparin currently -Due to prolonged QTC, not a good candidate for amiodarone, Tikosyn  Hyperlipidemia -Continue Crestor  Demand ischemia -Due to acute illness  Seizure disorder -Resume Valium, watch patient's mentation closely  Peripheral neuropathy -Resume Neurontin   DVT prophylaxis: IV heparin, holding Xarelto for now Code Status: Full code Family Communication: No family at bedside Disposition Plan: Pending further improvement, heart cath planned for Monday   Consultants:   Critical care  Advanced heart failure  Procedures:   Central line placed 1/29  Antimicrobials:  Anti-infectives (From admission, onward)   None       Objective: Vitals:   12/04/18 0300 12/04/18 0652 12/04/18 0752 12/04/18 0825  BP:  116/78  130/76  Pulse:  88  78  Resp:  16  16  Temp:  98.4 F (36.9 C)  99.3 F (37.4 C)  TempSrc:  Oral  Oral  SpO2: 95% 97% 96% 98%  Weight:      Height:        Intake/Output Summary (Last 24 hours) at 12/04/2018 1045 Last data filed at 12/04/2018 0930 Gross per 24 hour  Intake 582.09 ml  Output 2550 ml  Net -1967.91 ml   Filed Weights   12/01/18 0416 12/02/18 0309 12/03/18 2119  Weight: 127 kg 122.1 kg 118.9 kg    Examination:  General exam: Appears calm and comfortable  Respiratory system: Rhonchi left base. Respiratory effort normal.  On nasal cannula  O2 Cardiovascular system: S1 & S2 heard, irregular rhythm. No JVD, murmurs, rubs, gallops or clicks. No pedal edema. Gastrointestinal system: Abdomen is nondistended, soft and nontender. No organomegaly or masses felt. Normal bowel sounds heard. Central nervous system: Alert and oriented. No focal neurological deficits. Extremities: Symmetric 5 x 5 power. Skin: No rashes,  lesions or ulcers Psychiatry: Judgement and insight appear normal. Mood & affect appropriate.   Data Reviewed: I have personally reviewed following labs and imaging studies  CBC: Recent Labs  Lab 12/01/18 0011  12/01/18 0323 12/02/18 0419 12/02/18 0636 12/03/18 0447 12/04/18 0630  WBC 11.3*  --   --   --  7.0 7.9 8.9  NEUTROABS 9.7*  --   --   --   --   --   --   HGB 9.4*   < > 10.9* 10.9* 10.1* 9.1* 9.1*  HCT 32.5*   < > 32.0* 32.0* 32.1* 29.8* 29.6*  MCV 90.5  --   --   --  82.5 86.1 86.8  PLT 328  --   --   --  374 315 306   < > = values in this interval not displayed.   Basic Metabolic Panel: Recent Labs  Lab 12/01/18 0408 12/01/18 1555 12/02/18 0342 12/02/18 0419 12/03/18 0447 12/04/18 0630  NA 135 138 140 138 144 143  K 5.4* 5.3* 4.7 4.8 3.8 3.5  CL 90* 92* 95*  --  98 97*  CO2 _0 --  33* 35*  GLUCOSE 78 159* 167*  --  162* 183*  BUN 96* 92* 80*  --  65* 56*  CREATININE 3.15* 2.15* 1.56*  --  1.27* 1.36*  CALCIUM 8.8* 8.8* 9.0  --  8.8* 8.5*  MG 2.9*  --   --   --   --   --   PHOS 5.5*  --   --   --   --   --    GFR: Estimated Creatinine Clearance: 67.3 mL/min (A) (by C-G formula based on SCr of 1.36 mg/dL (H)). Liver Function Tests: No results for input(s): AST, ALT, ALKPHOS, BILITOT, PROT, ALBUMIN in the last 168 hours. No results for input(s): LIPASE, AMYLASE in the last 168 hours. No results for input(s): AMMONIA in the last 168 hours. Coagulation Profile: No results for input(s): INR, PROTIME in the last 168 hours. Cardiac Enzymes: Recent Labs  Lab 12/01/18 0408 12/01/18 1206  TROPONINI 0.36* 0.42*   BNP (last 3 results) No results for input(s): PROBNP in the last 8760 hours. HbA1C: No results for input(s): HGBA1C in the last 72 hours. CBG: Recent Labs  Lab 12/03/18 1143 12/03/18 1600 12/03/18 2030 12/04/18 0008 12/04/18 0422  GLUCAP 145* 223* 312* 222* 191*   Lipid Profile: No results for input(s): CHOL, HDL, LDLCALC, TRIG,  CHOLHDL, LDLDIRECT in the last 72 hours. Thyroid Function Tests: No results for input(s): TSH, T4TOTAL, FREET4, T3FREE, THYROIDAB in the last 72 hours. Anemia Panel: No results for input(s): VITAMINB12, FOLATE, FERRITIN, TIBC, IRON, RETICCTPCT in the last 72 hours. Sepsis Labs: No results for input(s): PROCALCITON, LATICACIDVEN in the last 168 hours.  Recent Results (from the past 240 hour(s))  Culture, blood (routine x 2)     Status: None (Preliminary result)   Collection Time: 12/01/18  4:00 PM  Result Value Ref Range Status   Specimen Description BLOOD RIGHT HAND  Final   Special Requests   Final    BOTTLES DRAWN AEROBIC ONLY Blood Culture adequate volume  Culture NO GROWTH 3 DAYS  Final   Report Status PENDING  Incomplete  Culture, respiratory (non-expectorated)     Status: None (Preliminary result)   Collection Time: 12/01/18  4:03 PM  Result Value Ref Range Status   Specimen Description TRACHEAL ASPIRATE  Final   Special Requests NONE  Final   Gram Stain   Final    RARE WBC PRESENT, PREDOMINANTLY PMN RARE GRAM POSITIVE COCCI RARE GRAM POSITIVE RODS Performed at [facility], [address] [place], [place] [address]    Culture FEW Consistent with normal respiratory flora.  Final   Report Status PENDING  Incomplete  Culture, blood (routine x 2)     Status: None (Preliminary result)   Collection Time: 12/01/18  4:10 PM  Result Value Ref Range Status   Specimen Description BLOOD RIGHT HAND  Final   Special Requests   Final    BOTTLES DRAWN AEROBIC ONLY Blood Culture results may not be optimal due to an inadequate volume of blood received in culture bottles   Culture NO GROWTH 3 DAYS  Final   Report Status PENDING  Incomplete       Radiology Studies: No results found.    Scheduled Meds: . bisoprolol  5 mg Oral Daily  . dextromethorphan-guaiFENesin  1 tablet Oral BID  . diazepam  10 mg Oral Daily  . gabapentin  300 mg Oral BID  . insulin aspart  0-15 Units  Subcutaneous TID WC  . ipratropium-albuterol  3 mL Nebulization Q6H  . isosorbide-hydrALAZINE  0.5 tablet Oral TID  . mouth rinse  15 mL Mouth Rinse BID  . mometasone-formoterol  2 puff Inhalation BID  . pantoprazole (PROTONIX) IV  40 mg Intravenous QHS  . predniSONE  40 mg Oral Q breakfast  . rosuvastatin  20 mg Per Tube Daily  . torsemide  60 mg Oral BID   Continuous Infusions: . heparin 1,250 Units/hr (12/04/18 1008)     LOS: 3 days    Time spent: 45 minutes   Dessa Phi, DO [facility] [email or web address] 12/04/2018, 10:45 AM

## 2018-12-04 NOTE — Progress Notes (Signed)
OT Cancellation Note  Patient Details Name: Nicholas Caldwell MRN: [id number] DOB: September 10, 1946   Cancelled Treatment:    Reason Eval/Treat Not Completed: Other (comment). Pt just returned to bed and fatigued. Plan to reattempt.  Tyrone Schimke, OT Acute Rehabilitation Services Pager: [phone] Office: [phone]  12/04/2018, 12:07 PM

## 2018-12-04 NOTE — Progress Notes (Signed)
Patient was helped to the chair by the nursing student, he is in the chair with no complains.

## 2018-12-04 NOTE — Progress Notes (Signed)
Patient asked for a sprite, after discussing with nurse Zenaib drink was brought to the patient. He received education on ingested volume restrictions and why they are necessary on a person with CHF.

## 2018-12-04 NOTE — Progress Notes (Addendum)
Advanced Heart Failure Rounding Note  PCP-Cardiologist: No primary care provider on file.   Subjective:    Back on po diuretics. Co-ox 66%. Creatinine stable 1.3.  Remains on prednisone. Breathing much better. No CP, orthopnea or PND.   Remains in AFL with HR 80-90s  Xarelto on hold. On heparin for cath. Hgb down 10.9 -> 9.1 over past 3-4 days.  No overt bleeding   Objective:   Weight Range: 118.9 kg Body mass index is 35.55 kg/m.   Vital Signs:   Temp:  [98.1 F (36.7 C)-99.5 F (37.5 C)] 99.3 F (37.4 C) (02/01 0825) Pulse Rate:  [66-109] 78 (02/01 0825) Resp:  [15-23] 16 (02/01 0825) BP: (97-176)/(63-89) 130/76 (02/01 0825) SpO2:  [86 %-99 %] 94 % (02/01 1353) Weight:  [118.9 kg] 118.9 kg (01/31 2119) Last BM Date: 12/03/18  Weight change: Filed Weights   12/01/18 0416 12/02/18 0309 12/03/18 2119  Weight: 127 kg 122.1 kg 118.9 kg   Intake/Output:   Intake/Output Summary (Last 24 hours) at 12/04/2018 1418 Last data filed at 12/04/2018 1400 Gross per 24 hour  Intake 1254.76 ml  Output 2175 ml  Net -920.24 ml    Physical Exam   General:  Sitting in chair. No resp difficulty HEENT: normal Neck: supple. JVP 7-8. Carotids 2+ bilat; no bruits. No lymphadenopathy or thryomegaly appreciated. Cor: PMI nondisplaced. Irregular rate & rhythm. No rubs, gallops or murmurs. Lungs: clear with decreased BS throughout Abdomen: obese soft, nontender, nondistended. No hepatosplenomegaly. No bruits or masses. Good bowel sounds. Extremities: no cyanosis, clubbing, rash, edema Neuro: alert & orientedx3, cranial nerves grossly intact. moves all 4 extremities w/o difficulty. Affect pleasant   Telemetry   Aflutter with controlled VR in 70-90s, personally reviewed.   EKG   No new tracings.    Labs    CBC Recent Labs    12/03/18 0447 12/04/18 0630  WBC 7.9 8.9  HGB 9.1* 9.1*  HCT 29.8* 29.6*  MCV 86.1 86.8  PLT 315 941   Basic Metabolic Panel Recent Labs   12/03/18 0447 12/04/18 0630  NA 144 143  K 3.8 3.5  CL 98 97*  CO2 33* 35*  GLUCOSE 162* 183*  BUN 65* 56*  CREATININE 1.27* 1.36*  CALCIUM 8.8* 8.5*   Liver Function Tests No results for input(s): AST, ALT, ALKPHOS, BILITOT, PROT, ALBUMIN in the last 72 hours. No results for input(s): LIPASE, AMYLASE in the last 72 hours. Cardiac Enzymes No results for input(s): CKTOTAL, CKMB, CKMBINDEX, TROPONINI in the last 72 hours.  BNP: BNP (last 3 results) Recent Labs    11/16/18 0237 11/21/18 2238 12/01/18 0015  BNP 606.1* 1,739.7* 1,666.7*    ProBNP (last 3 results) No results for input(s): PROBNP in the last 8760 hours.   D-Dimer No results for input(s): DDIMER in the last 72 hours. Hemoglobin A1C No results for input(s): HGBA1C in the last 72 hours. Fasting Lipid Panel No results for input(s): CHOL, HDL, LDLCALC, TRIG, CHOLHDL, LDLDIRECT in the last 72 hours. Thyroid Function Tests No results for input(s): TSH, T4TOTAL, T3FREE, THYROIDAB in the last 72 hours.  Invalid input(s): FREET3  Other results:   Imaging    No results found.   Medications:     Scheduled Medications: . bisoprolol  5 mg Oral Daily  . dextromethorphan-guaiFENesin  1 tablet Oral BID  . diazepam  10 mg Oral Daily  . gabapentin  300 mg Oral BID  . insulin aspart  0-15 Units Subcutaneous TID WC  .  ipratropium-albuterol  3 mL Nebulization Q6H  . isosorbide-hydrALAZINE  0.5 tablet Oral TID  . mouth rinse  15 mL Mouth Rinse BID  . mometasone-formoterol  2 puff Inhalation BID  . pantoprazole (PROTONIX) IV  40 mg Intravenous QHS  . predniSONE  40 mg Oral Q breakfast  . rosuvastatin  20 mg Per Tube Daily  . torsemide  60 mg Oral BID    Infusions: . heparin 1,250 Units/hr (12/04/18 1008)    PRN Medications: albuterol, docusate sodium, oxyCODONE-acetaminophen, sodium chloride flush    Patient Profile   Nicholas Caldwell is a 73 y.o. male with PMH of chronic systolic CHF/nonischemic  cardiomyopathy Echo 12/13/2016 LVEF 40-45%, COPD on home oxygen, PAFs/p DCCV 2018,and history of non compliance.  Admitted overnight with acute respiratory failure, acute renal failure, and hypotension.   Assessment/Plan   1. Acute on chronic hypercarbic/hypoxemic respiratory failure: He is on home oxygen at baseline.  Patient was intubated at admission. Likely multifactorial .He initially appeared volume overloaded on exam with pulmonary edema on CXR. Also concern for oversedation from home meds and COPD.   - Much improved with diuresis and treatment of COPD. Doing well on [place] - Will need eventual sleep study. Was scheduled for 11/30/2018. - Diuretics as a below. 2. Acute on chronic systolic CHF: Echo in 3/84 with EF 40-45%, mild RV dilation.  Nonischemic cardiomyopathy.  He was admitted with hypotension and was initially on norepinephrine.  Pulmonary edema on initial CXR and volume overloaded on exam.  Also with AKI at admission.  - Echo 12/02/2018 LVEF stable at 40-45%.  - Plan [facility] Monday.  - Weight and CVP stable on torsemide 80 mg BID. Will continue - Coox 66%.  - BP running up into 140-160s.  - Holding lisinopril and spironolactone with AKI. Now improving. Will hold with pending cath. Consider Entresto post cath. - On Bidil 0.5 tabs tid. Will increase to 1 tab tid.  - Having more PVCs off of his BB.  BP running 140-160s. Bisoprolol increased 1/31 to 5 mg daily.  3. AKI likely due to ATN: Baseline creatinine 1.0.  - Creatinine 3.48 on 12/01/18 - Creatinine now stabilizing in 1.2-1.3 range - Holding lisinopril and spironolactone with AKI, which is now improving.  4. Shock/hypotension: Presented with low BP. No fever and WBCs near normal.  - Unclear at this time what caused his presentation.  - Now off NE - Coox 67%. 5. COPD:  - Per CCM. No wheeze on exam.  6. Atrial flutter; recurrent.  - Likely in setting of acute illness. Now rate controlled  - He has previously failed  amiodarone due to Prolonged QT. Not tikosyn candidate for same reason.  - Xarelto on hold for cath. Remains oh heparin. Hgb drifting down. No overt bleeding. Watch closely  - Possible DC-CV prior to d/c 7. Elevated troponin: Mild troponin elevation with no trend.   - Likely demand ischemia with volume overload/hypotension.  - Plan R/LHC on Monday.  8. Hypokalemia - will supp   Medication concerns reviewed with patient and pharmacy team. Barriers identified: None at this time.   Length of Stay: 3  Glori Bickers, MD  12/04/2018, 2:18 PM  Advanced Heart Failure Team Pager [phone] (M-F; [place])  Please contact [facility] for night-coverage after hours (4p -7a ) and weekends on [email or web address]

## 2018-12-04 NOTE — Progress Notes (Signed)
Removed foley, tubing intact. Patient tolerated well. Placed urinal at bedside.

## 2018-12-05 LAB — BASIC METABOLIC PANEL
ANION GAP: 13 (ref 5–15)
BUN: 55 mg/dL — ABNORMAL HIGH (ref 8–23)
CO2: 33 mmol/L — ABNORMAL HIGH (ref 22–32)
Calcium: 7.9 mg/dL — ABNORMAL LOW (ref 8.9–10.3)
Chloride: 93 mmol/L — ABNORMAL LOW (ref 98–111)
Creatinine, Ser: 1.63 mg/dL — ABNORMAL HIGH (ref 0.61–1.24)
GFR calc Af Amer: 49 mL/min — ABNORMAL LOW (ref >60)
GFR calc non Af Amer: 42 mL/min — ABNORMAL LOW (ref >60)
Glucose, Bld: 189 mg/dL — ABNORMAL HIGH (ref 70–99)
Potassium: 3.5 mmol/L (ref 3.5–5.1)
Sodium: 139 mmol/L (ref 135–145)

## 2018-12-05 LAB — GLUCOSE, CAPILLARY
Glucose-Capillary: 178 mg/dL — ABNORMAL HIGH (ref 70–99)
Glucose-Capillary: 215 mg/dL — ABNORMAL HIGH (ref 70–99)
Glucose-Capillary: 278 mg/dL — ABNORMAL HIGH (ref 70–99)
Glucose-Capillary: 318 mg/dL — ABNORMAL HIGH (ref 70–99)

## 2018-12-05 LAB — CBC
HCT: 30.1 % — ABNORMAL LOW (ref 39.0–52.0)
Hemoglobin: 8.7 g/dL — ABNORMAL LOW (ref 13.0–17.0)
MCH: 25.1 pg — ABNORMAL LOW (ref 26.0–34.0)
MCHC: 28.9 g/dL — ABNORMAL LOW (ref 30.0–36.0)
MCV: 87 fL (ref 80.0–100.0)
Platelets: 301 10*3/uL (ref 150–400)
RBC: 3.46 MIL/uL — AB (ref 4.22–5.81)
RDW: 17.2 % — ABNORMAL HIGH (ref 11.5–15.5)
WBC: 12.8 10*3/uL — ABNORMAL HIGH (ref 4.0–10.5)
nRBC: 0 % (ref 0.0–0.2)

## 2018-12-05 LAB — COOXEMETRY PANEL
Carboxyhemoglobin: 1.7 % — ABNORMAL HIGH (ref 0.5–1.5)
Methemoglobin: 1.3 % (ref 0.0–1.5)
O2 Saturation: 71.5 %
Total hemoglobin: 6.1 g/dL — CL (ref 12.0–16.0)

## 2018-12-05 LAB — HEPARIN LEVEL (UNFRACTIONATED): HEPARIN UNFRACTIONATED: 0.66 [IU]/mL (ref 0.30–0.70)

## 2018-12-05 LAB — APTT: aPTT: 81 seconds — ABNORMAL HIGH (ref 24–36)

## 2018-12-05 MED ORDER — SODIUM CHLORIDE 0.9 % IV SOLN: 250.0000 mL | INTRAVENOUS | Status: DC | PRN

## 2018-12-05 MED ORDER — SODIUM CHLORIDE 0.9% FLUSH
3.0000 mL | Freq: Two times a day (BID) | INTRAVENOUS | Status: DC
  Administered 2018-12-05: 3 mL via INTRAVENOUS

## 2018-12-05 MED ORDER — SODIUM CHLORIDE 0.9 % IV SOLN: INTRAVENOUS | Status: DC

## 2018-12-05 MED ORDER — ROSUVASTATIN CALCIUM 20 MG PO TABS
20.0000 mg | ORAL_TABLET | Freq: Every day | ORAL | Status: DC
  Administered 2018-12-06 – 2018-12-12 (×7): 20 mg via ORAL
  Filled 2018-12-05 (×7): qty 1

## 2018-12-05 MED ORDER — SODIUM CHLORIDE 0.9% FLUSH: 3.0000 mL | INTRAVENOUS | Status: DC | PRN

## 2018-12-05 MED ORDER — SODIUM CHLORIDE 0.9 % IV SOLN
INTRAVENOUS | Status: DC
  Administered 2018-12-05 – 2018-12-06 (×2): via INTRAVENOUS

## 2018-12-05 MED ORDER — ASPIRIN 81 MG PO CHEW
81.0000 mg | CHEWABLE_TABLET | ORAL | Status: AC
  Administered 2018-12-06: 81 mg via ORAL
  Filled 2018-12-05: qty 1

## 2018-12-05 NOTE — Progress Notes (Signed)
[place] for Heparin (Xarelto on hold) Indication: chest pain/ACS, suspected NSTEMI  No Known Allergies  Patient Measurements: Height: 6' (182.9 cm) Weight: 263 lb 4.8 oz (119.4 kg) IBW/kg (Calculated) : 77.6 Heparin Dosing Weight: 106  Vital Signs: Temp: 97.7 F (36.5 C) (02/02 0452) Temp Source: Oral (02/02 0452) BP: 116/62 (02/02 0452) Pulse Rate: 82 (02/02 0452)  Labs: Recent Labs    12/03/18 0447 12/04/18 0630 12/05/18 0448  HGB 9.1* 9.1* 8.7*  HCT 29.8* 29.6* 30.1*  PLT 315 306 301  APTT 91* 91* 81*  HEPARINUNFRC 0.87* 0.75* 0.66  CREATININE 1.27* 1.36* 1.63*    Estimated Creatinine Clearance: 56.2 mL/min (A) (by C-G formula based on SCr of 1.63 mg/dL (H)).   Medical History: Past Medical History:  Diagnosis Date  . Atrial flutter ([place])    a. recurrent AFlutter with RVR;  b. Amiodarone Rx started 4/16  . CAD (coronary artery disease)    a. LHC 1/16:  mLAD diffuse disease, pLCx mild disease, dLCx with disease but too small for PCI, RCA ok, EF 25-30%  . Chronic pain   . Chronic systolic CHF (congestive heart failure) ([place])   . COPD (chronic obstructive pulmonary disease) ([place])   . Diabetes mellitus without complication ([place])   . Hypercholesteremia   . Hypertension   . NICM (nonischemic cardiomyopathy) ([place])    a.dx 2016. b. 2D echo 06/2016 - Last echo 07/01/16: mod dilated LV, mod LVH, EF 25-30%, mild-mod MR, sev LAE, mild-mod reduced RV systolic function, mild-mod TR, PASP 22mHG.  .Marland KitchenPAF (paroxysmal atrial fibrillation) (HCC)    On amio - ot a candidate for flecainide due to cardiomyopathy, not a candidate for Tikosyn due to prolonged QT, and felt to be a poor candidate for ablation given left atrial size.  . Pulmonary hypertension ([place]   . Tobacco abuse     Medications:  Scheduled:  . bisoprolol  5 mg Oral Daily  . dextromethorphan-guaiFENesin  1 tablet Oral BID  . diazepam  10 mg Oral Daily  . gabapentin  300  mg Oral BID  . insulin aspart  0-15 Units Subcutaneous TID WC  . ipratropium-albuterol  3 mL Nebulization Q6H  . isosorbide-hydrALAZINE  1 tablet Oral TID  . mouth rinse  15 mL Mouth Rinse BID  . mometasone-formoterol  2 puff Inhalation BID  . pantoprazole (PROTONIX) IV  40 mg Intravenous QHS  . predniSONE  40 mg Oral Q breakfast  . rosuvastatin  20 mg Per Tube Daily  . torsemide  60 mg Oral BID    Assessment: 73yo with hx of Afib (on xarleto PTA, last dose PTA ~1800 on 15/91, COPD, systolic CHF, chronic pain. Presented with lethargy, hypotension and bradycardia. Patient was intubated for airway protection / acute hypoxic / hypercarbic respiratory failure.   This morning aPTT and heparin level now correlating and are therapeutic. Noted Hgb down from 9.1 to 8.7 - CBC otherwise remains low/stable. Now that apTT and heparin level are correlating will trend heparin levels going forward. No s/sx of bleeding reported or issues with infusion noted.    Goal of Therapy:  Heparin level 0.3-0.7 units/ml aPTT 66-102 seconds Monitor platelets by anticoagulation protocol: Yes   Plan:  Continue heparin infusion at 1250 units/hr Daily heparin level and CBC Monitor for s/sx of bleeding  Thank you for allowing pharmacy to be a part of this patient's care.  RLeron Croak PharmD PGY1 Pharmacy Resident Phone: [phone] Please  check AMION for all [place] phone numbers

## 2018-12-05 NOTE — Progress Notes (Signed)
Patient resting comfortably, did inquire about pain medication around 10pm, after RN told him it is due later, he fell asleep. Has not complained about any pain.

## 2018-12-05 NOTE — Progress Notes (Signed)
Co-ox resulted with total hemoglobin of 6.9, NP paged.

## 2018-12-05 NOTE — Progress Notes (Addendum)
PROGRESS NOTE    Nicholas Caldwell  KVQ:[id number] DOB: Mar 17, 1946 DOA: 11/30/2018 PCP: [facility]     Brief Narrative:  Nicholas Caldwell is a 73 yo with hx of A fib, COPD, systolic CHF, and chronic pain reportedly took extra pain medications before going to bed.  Afterward his wife was worried that he was getting too sleepy and shaking, and that his blood pressure might be too low.  He was brought to the ER and intubated due to respiratory failure and airway protection.  He was found to have hyperkalemia and acute renal failure.  He takes valium, neurontin, and percocet at home.  He was consulted for acute on chronic systolic heart failure.  He was eventually extubated and transferred to [facility] 2/1.   New events last 24 hours / Subjective: No new complaints today.  Denies any chest pain or shortness of breath.  Assessment & Plan:   Principal Problem:   Acute respiratory failure with hypoxia and hypercapnia (HCC) Active Problems:   Essential hypertension   Type 2 diabetes mellitus with neuropathy   Hyperlipidemia    COPD exacerbation (HCC)   PAF (paroxysmal atrial fibrillation) (HCC)   Nonischemic cardiomyopathy (HCC)   CAD - Non-obstructive by LHC1/16   Acute on chronic combined systolic and diastolic CHF (congestive heart failure) (HCC)   Acute pulmonary edema (HCC)   AKI (acute kidney injury) (HCC)   Acute on chronic hypoxemic and hypercapnic respiratory failure secondary to COPD, undiagnosed OSA, polypharmacy -Has been extubated and now on his baseline nasal cannula O2 -Had outpatient sleep study scheduled for 1/28 which was canceled due to hospitalization.  Will need to reschedule upon discharge home  Acute on chronic systolic heart failure -EF 40 to 45% -Central line in place to follow Coox/CVP -Heart failure team following, planning for heart cath on Monday -Bisoprolol resumed -Holding spironolactone, lisinopril due to acute kidney injury -Continue torsemide  BID  CAD -Continue BiDil  COPD exacerbation -Continue Dulera, prednisone course completed  Shock -?Cardiogenic  -Resolved.  Bisoprolol resumed  Acute toxic encephalopathy -Likely secondary to polypharmacy and respiratory failure -Resolved and back to baseline  Acute kidney injury on CKD stage III -Baseline creatinine 1 -Creatinine on admission 3.48 and trending downward, slight bump in creatinine today 1.63 continue to monitor while on diuretic  Paroxysmal atrial fibrillation -Hold Xarelto, on IV heparin currently -Due to prolonged QTC, not a good candidate for amiodarone, Tikosyn  Hyperlipidemia -Continue Crestor  Demand ischemia -Due to acute illness  Seizure disorder -Resume Valium, watch patient's mentation closely  Peripheral neuropathy -Resume Neurontin  Leukocytosis -Patient just completed prednisone course.  Continue to monitor, no concern for infection at this point.  Afebrile.   DVT prophylaxis: IV heparin, holding Xarelto for now Code Status: Full code Family Communication: Family member sleeping at bedside Disposition Plan: Pending further improvement, heart cath planned for Monday   Consultants:   Critical care  Advanced heart failure  Procedures:   Central line placed 1/29  Antimicrobials:  Anti-infectives (From admission, onward)   None       Objective: Vitals:   12/05/18 0436 12/05/18 0447 12/05/18 0452 12/05/18 0855  BP:   116/62 121/66  Pulse:   82 83  Resp:  18    Temp:   97.7 F (36.5 C)   TempSrc:   Oral   SpO2:   98%   Weight: 119.4 kg     Height:        Intake/Output Summary (Last 24 hours) at  12/05/2018 0947 Last data filed at 12/05/2018 0900 Gross per 24 hour  Intake 1711.67 ml  Output 1400 ml  Net 311.67 ml   Filed Weights   12/02/18 0309 12/03/18 2119 12/05/18 0436  Weight: 122.1 kg 118.9 kg 119.4 kg    Examination: General exam: Appears calm and comfortable  Respiratory system: Diminished breath sounds  without wheeze or rhonchi Cardiovascular system: S1 & S2 heard, irregular rhythm. No JVD, murmurs, rubs, gallops or clicks. No pedal edema. Gastrointestinal system: Abdomen is nondistended, soft and nontender. No organomegaly or masses felt. Normal bowel sounds heard. Central nervous system: Alert and oriented. No focal neurological deficits. Extremities: Symmetric 5 x 5 power. Skin: No rashes, lesions or ulcers Psychiatry: Judgement and insight appear normal. Mood & affect appropriate.   Data Reviewed: I have personally reviewed following labs and imaging studies  CBC: Recent Labs  Lab 12/01/18 0011  12/02/18 0419 12/02/18 0636 12/03/18 0447 12/04/18 0630 12/05/18 0448  WBC 11.3*  --   --  7.0 7.9 8.9 12.8*  NEUTROABS 9.7*  --   --   --   --   --   --   HGB 9.4*   < > 10.9* 10.1* 9.1* 9.1* 8.7*  HCT 32.5*   < > 32.0* 32.1* 29.8* 29.6* 30.1*  MCV 90.5  --   --  82.5 86.1 86.8 87.0  PLT 328  --   --  374 315 306 301   < > = values in this interval not displayed.   Basic Metabolic Panel: Recent Labs  Lab 12/01/18 0408 12/01/18 1555 12/02/18 0342 12/02/18 0419 12/03/18 0447 12/04/18 0630 12/05/18 0448  NA 135 138 140 138 144 143 139  K 5.4* 5.3* 4.7 4.8 3.8 3.5 3.5  CL 90* 92* 95*  --  98 97* 93*  CO2 _0 --  33* 35* 33*  GLUCOSE 78 159* 167*  --  162* 183* 189*  BUN 96* 92* 80*  --  65* 56* 55*  CREATININE 3.15* 2.15* 1.56*  --  1.27* 1.36* 1.63*  CALCIUM 8.8* 8.8* 9.0  --  8.8* 8.5* 7.9*  MG 2.9*  --   --   --   --   --   --   PHOS 5.5*  --   --   --   --   --   --    GFR: Estimated Creatinine Clearance: 56.2 mL/min (A) (by C-G formula based on SCr of 1.63 mg/dL (H)). Liver Function Tests: No results for input(s): AST, ALT, ALKPHOS, BILITOT, PROT, ALBUMIN in the last 168 hours. No results for input(s): LIPASE, AMYLASE in the last 168 hours. No results for input(s): AMMONIA in the last 168 hours. Coagulation Profile: No results for input(s): INR, PROTIME in  the last 168 hours. Cardiac Enzymes: Recent Labs  Lab 12/01/18 0408 12/01/18 1206  TROPONINI 0.36* 0.42*   BNP (last 3 results) No results for input(s): PROBNP in the last 8760 hours. HbA1C: No results for input(s): HGBA1C in the last 72 hours. CBG: Recent Labs  Lab 12/04/18 0810 12/04/18 1228 12/04/18 1713 12/04/18 2138 12/05/18 0813  GLUCAP 204* 195* 284* 228* 178*   Lipid Profile: No results for input(s): CHOL, HDL, LDLCALC, TRIG, CHOLHDL, LDLDIRECT in the last 72 hours. Thyroid Function Tests: No results for input(s): TSH, T4TOTAL, FREET4, T3FREE, THYROIDAB in the last 72 hours. Anemia Panel: No results for input(s): VITAMINB12, FOLATE, FERRITIN, TIBC, IRON, RETICCTPCT in the last 72 hours. Sepsis Labs: No results  for input(s): PROCALCITON, LATICACIDVEN in the last 168 hours.  Recent Results (from the past 240 hour(s))  Culture, blood (routine x 2)     Status: None (Preliminary result)   Collection Time: 12/01/18  4:00 PM  Result Value Ref Range Status   Specimen Description BLOOD RIGHT HAND  Final   Special Requests   Final    BOTTLES DRAWN AEROBIC ONLY Blood Culture adequate volume   Culture NO GROWTH 4 DAYS  Final   Report Status PENDING  Incomplete  Culture, respiratory (non-expectorated)     Status: None   Collection Time: 12/01/18  4:03 PM  Result Value Ref Range Status   Specimen Description TRACHEAL ASPIRATE  Final   Special Requests NONE  Final   Gram Stain   Final    RARE WBC PRESENT, PREDOMINANTLY PMN RARE GRAM POSITIVE COCCI RARE GRAM POSITIVE RODS Performed at [facility], [place] [address] [place], [place] [address]    Culture FEW Consistent with normal respiratory flora.  Final   Report Status 12/04/2018 FINAL  Final  Culture, blood (routine x 2)     Status: None (Preliminary result)   Collection Time: 12/01/18  4:10 PM  Result Value Ref Range Status   Specimen Description BLOOD RIGHT HAND  Final   Special Requests   Final    BOTTLES  DRAWN AEROBIC ONLY Blood Culture results may not be optimal due to an inadequate volume of blood received in culture bottles   Culture NO GROWTH 4 DAYS  Final   Report Status PENDING  Incomplete       Radiology Studies: No results found.    Scheduled Meds: . bisoprolol  5 mg Oral Daily  . dextromethorphan-guaiFENesin  1 tablet Oral BID  . diazepam  10 mg Oral Daily  . gabapentin  300 mg Oral BID  . insulin aspart  0-15 Units Subcutaneous TID WC  . ipratropium-albuterol  3 mL Nebulization Q6H  . isosorbide-hydrALAZINE  1 tablet Oral TID  . mouth rinse  15 mL Mouth Rinse BID  . mometasone-formoterol  2 puff Inhalation BID  . pantoprazole (PROTONIX) IV  40 mg Intravenous QHS  . rosuvastatin  20 mg Per Tube Daily  . torsemide  60 mg Oral BID   Continuous Infusions: . heparin 1,250 Units/hr (12/05/18 0853)     LOS: 4 days    Time spent: 34mnutes   JDessa Phi DO [facility] [email or web address] 12/05/2018, 9:47 AM

## 2018-12-05 NOTE — Progress Notes (Signed)
Advanced Heart Failure Rounding Note  PCP-Cardiologist: No primary care provider on file.   Subjective:    Back on po diuretics. Co-ox 71%. Creatinine up 1.3 -> 1.6. Weight up 1 pound.   Feels good. Denies dyspnea, orthopnea or PND.   Remains in AFL with HR 80-90s  Xarelto on hold. On heparin for cath. Hgb down 10.9 -> 9.1 -> 8.7 over past 3-4 days.  No overt bleeding   Objective:   Weight Range: 119.4 kg Body mass index is 35.71 kg/m.   Vital Signs:   Temp:  [97.7 F (36.5 C)-98.5 F (36.9 C)] 97.7 F (36.5 C) (02/02 0452) Pulse Rate:  [55-87] 87 (02/02 0950) Resp:  [18-20] 18 (02/02 0950) BP: (97-121)/(57-66) 121/66 (02/02 0855) SpO2:  [94 %-100 %] 99 % (02/02 0950) Weight:  [119.4 kg] 119.4 kg (02/02 0436) Last BM Date: 12/04/18  Weight change: Filed Weights   12/02/18 0309 12/03/18 2119 12/05/18 0436  Weight: 122.1 kg 118.9 kg 119.4 kg   Intake/Output:   Intake/Output Summary (Last 24 hours) at 12/05/2018 1305 Last data filed at 12/05/2018 1229 Gross per 24 hour  Intake 1951.67 ml  Output 1700 ml  Net 251.67 ml    Physical Exam   General:  Sitting in chair. Well appearing. No resp difficulty HEENT: normal Neck: supple. Hard to see JVP with size. Carotids 2+ bilat; no bruits. No lymphadenopathy or thryomegaly appreciated. Cor: PMI nondisplaced. Irregular rate & rhythm. No rubs, gallops or murmurs. Lungs: clear Abdomen: obese soft, nontender, nondistended. No hepatosplenomegaly. No bruits or masses. Good bowel sounds. Extremities: no cyanosis, clubbing, rash, edema Neuro: alert & orientedx3, cranial nerves grossly intact. moves all 4 extremities w/o difficulty. Affect pleasant   Telemetry   Aflutter with controlled VR in 80-90s, personally reviewed.   EKG   No new tracings.    Labs    CBC Recent Labs    12/04/18 0630 12/05/18 0448  WBC 8.9 12.8*  HGB 9.1* 8.7*  HCT 29.6* 30.1*  MCV 86.8 87.0  PLT 306 536   Basic Metabolic  Panel Recent Labs    12/04/18 0630 12/05/18 0448  NA 143 139  K 3.5 3.5  CL 97* 93*  CO2 35* 33*  GLUCOSE 183* 189*  BUN 56* 55*  CREATININE 1.36* 1.63*  CALCIUM 8.5* 7.9*   Liver Function Tests No results for input(s): AST, ALT, ALKPHOS, BILITOT, PROT, ALBUMIN in the last 72 hours. No results for input(s): LIPASE, AMYLASE in the last 72 hours. Cardiac Enzymes No results for input(s): CKTOTAL, CKMB, CKMBINDEX, TROPONINI in the last 72 hours.  BNP: BNP (last 3 results) Recent Labs    11/16/18 0237 11/21/18 2238 12/01/18 0015  BNP 606.1* 1,739.7* 1,666.7*    ProBNP (last 3 results) No results for input(s): PROBNP in the last 8760 hours.   D-Dimer No results for input(s): DDIMER in the last 72 hours. Hemoglobin A1C No results for input(s): HGBA1C in the last 72 hours. Fasting Lipid Panel No results for input(s): CHOL, HDL, LDLCALC, TRIG, CHOLHDL, LDLDIRECT in the last 72 hours. Thyroid Function Tests No results for input(s): TSH, T4TOTAL, T3FREE, THYROIDAB in the last 72 hours.  Invalid input(s): FREET3  Other results:   Imaging    No results found.   Medications:     Scheduled Medications: . bisoprolol  5 mg Oral Daily  . dextromethorphan-guaiFENesin  1 tablet Oral BID  . diazepam  10 mg Oral Daily  . gabapentin  300 mg Oral BID  .  insulin aspart  0-15 Units Subcutaneous TID WC  . ipratropium-albuterol  3 mL Nebulization Q6H  . isosorbide-hydrALAZINE  1 tablet Oral TID  . mouth rinse  15 mL Mouth Rinse BID  . mometasone-formoterol  2 puff Inhalation BID  . pantoprazole (PROTONIX) IV  40 mg Intravenous QHS  . [START ON 12/06/2018] rosuvastatin  20 mg Oral Daily  . torsemide  60 mg Oral BID    Infusions: . heparin 1,250 Units/hr (12/05/18 0853)    PRN Medications: albuterol, docusate sodium, oxyCODONE-acetaminophen, sodium chloride flush    Patient Profile   Nicholas Caldwell is a 73 y.o. male with PMH of chronic systolic CHF/nonischemic  cardiomyopathy Echo 12/13/2016 LVEF 40-45%, COPD on home oxygen, PAFs/p DCCV 2018,and history of non compliance.  Admitted overnight with acute respiratory failure, acute renal failure, and hypotension.   Assessment/Plan   1. Acute on chronic hypercarbic/hypoxemic respiratory failure: He is on home oxygen at baseline.  Patient was intubated at admission. Likely multifactorial .He initially appeared volume overloaded on exam with pulmonary edema on CXR. Also concern for oversedation from home meds and COPD.   - Much improved with diuresis and treatment of COPD. Doing well on [place] - Will need eventual sleep study. Was scheduled for 11/30/2018. - Will hold diuretics today with cath tomorrow. Gentle hydration.  2. Acute on chronic systolic CHF: Echo in 6/94 with EF 40-45%, mild RV dilation.  Nonischemic cardiomyopathy.  He was admitted with hypotension and was initially on norepinephrine.  Pulmonary edema on initial CXR and volume overloaded on exam.  Also with AKI at admission.  - Echo 12/02/2018 LVEF stable at 40-45%.  - Plan [facility] tomorrow  - Weight and CVP stable. Will hold diuretics today with cath tomorrow. Gentle hydration.  - Coox 72%.  - BP running up into 140-160s.  - Holding lisinopril and spironolactone with AKI. Now improving. Will hold with pending cath. Consider Entresto post cath. - Continue Bidil 1 tab tid - Continue bisoprolol  5 mg daily.  - BP running 100-120. Suspect he is dry. Hydrate gently pre cath 3. AKI likely due to ATN: Baseline creatinine 1.0.  - Creatinine 3.48 on 12/01/18 - Creatinine now stabilizing in 1.2-1.3 range but up to 1.6 today.  - Holding lisinopril and spironolactone with AKI, - Suspect he is dry. Hold diuretics. Hydrate gently pre cath 4. Shock/hypotension: Presented with low BP. No fever and WBCs near normal.  - Unclear at this time what caused his presentation.  - Now off NE - Coox 71% 5. COPD:  - Per CCM. No wheeze on exam.  6. Atrial flutter;  recurrent.  - Likely in setting of acute illness. Now rate controlled  - He has previously failed amiodarone due to Prolonged QT. Not tikosyn candidate for same reason.  - Xarelto on hold for cath. Remains oh heparin. Hgb drifting down. No overt bleeding. Watch closely  - Possible DC-CV prior to d/c 7. Elevated troponin: Mild troponin elevation with no trend.   - Likely demand ischemia with volume overload/hypotension.  - Plan [facility] tomorrow renal function permitting  8. Hypokalemia - will supp   Medication concerns reviewed with patient and pharmacy team. Barriers identified: None at this time.   Length of Stay: 4  Glori Bickers, MD  12/05/2018, 1:05 PM  Advanced Heart Failure Team Pager [phone] (M-F; [place])  Please contact [facility] for night-coverage after hours (4p -7a ) and weekends on [email or web address]

## 2018-12-05 NOTE — Plan of Care (Signed)
  Problem: Education: Goal: Knowledge of General Education information will improve Description Including pain rating scale, medication(s)/side effects and non-pharmacologic comfort measures Outcome: Progressing   Problem: Health Behavior/Discharge Planning: Goal: Ability to manage health-related needs will improve Outcome: Progressing

## 2018-12-05 NOTE — H&P (View-Only) (Signed)
Advanced Heart Failure Rounding Note  PCP-Cardiologist: No primary care provider on file.   Subjective:    Back on po diuretics. Co-ox 71%. Creatinine up 1.3 -> 1.6. Weight up 1 pound.   Feels good. Denies dyspnea, orthopnea or PND.   Remains in AFL with HR 80-90s  Xarelto on hold. On heparin for cath. Hgb down 10.9 -> 9.1 -> 8.7 over past 3-4 days.  No overt bleeding   Objective:   Weight Range: 119.4 kg Body mass index is 35.71 kg/m.   Vital Signs:   Temp:  [97.7 F (36.5 C)-98.5 F (36.9 C)] 97.7 F (36.5 C) (02/02 0452) Pulse Rate:  [55-87] 87 (02/02 0950) Resp:  [18-20] 18 (02/02 0950) BP: (97-121)/(57-66) 121/66 (02/02 0855) SpO2:  [94 %-100 %] 99 % (02/02 0950) Weight:  [119.4 kg] 119.4 kg (02/02 0436) Last BM Date: 12/04/18  Weight change: Filed Weights   12/02/18 0309 12/03/18 2119 12/05/18 0436  Weight: 122.1 kg 118.9 kg 119.4 kg   Intake/Output:   Intake/Output Summary (Last 24 hours) at 12/05/2018 1305 Last data filed at 12/05/2018 1229 Gross per 24 hour  Intake 1951.67 ml  Output 1700 ml  Net 251.67 ml    Physical Exam   General:  Sitting in chair. Well appearing. No resp difficulty HEENT: normal Neck: supple. Hard to see JVP with size. Carotids 2+ bilat; no bruits. No lymphadenopathy or thryomegaly appreciated. Cor: PMI nondisplaced. Irregular rate & rhythm. No rubs, gallops or murmurs. Lungs: clear Abdomen: obese soft, nontender, nondistended. No hepatosplenomegaly. No bruits or masses. Good bowel sounds. Extremities: no cyanosis, clubbing, rash, edema Neuro: alert & orientedx3, cranial nerves grossly intact. moves all 4 extremities w/o difficulty. Affect pleasant   Telemetry   Aflutter with controlled VR in 80-90s, personally reviewed.   EKG   No new tracings.    Labs    CBC Recent Labs    12/04/18 0630 12/05/18 0448  WBC 8.9 12.8*  HGB 9.1* 8.7*  HCT 29.6* 30.1*  MCV 86.8 87.0  PLT 306 536   Basic Metabolic  Panel Recent Labs    12/04/18 0630 12/05/18 0448  NA 143 139  K 3.5 3.5  CL 97* 93*  CO2 35* 33*  GLUCOSE 183* 189*  BUN 56* 55*  CREATININE 1.36* 1.63*  CALCIUM 8.5* 7.9*   Liver Function Tests No results for input(s): AST, ALT, ALKPHOS, BILITOT, PROT, ALBUMIN in the last 72 hours. No results for input(s): LIPASE, AMYLASE in the last 72 hours. Cardiac Enzymes No results for input(s): CKTOTAL, CKMB, CKMBINDEX, TROPONINI in the last 72 hours.  BNP: BNP (last 3 results) Recent Labs    11/16/18 0237 11/21/18 2238 12/01/18 0015  BNP 606.1* 1,739.7* 1,666.7*    ProBNP (last 3 results) No results for input(s): PROBNP in the last 8760 hours.   D-Dimer No results for input(s): DDIMER in the last 72 hours. Hemoglobin A1C No results for input(s): HGBA1C in the last 72 hours. Fasting Lipid Panel No results for input(s): CHOL, HDL, LDLCALC, TRIG, CHOLHDL, LDLDIRECT in the last 72 hours. Thyroid Function Tests No results for input(s): TSH, T4TOTAL, T3FREE, THYROIDAB in the last 72 hours.  Invalid input(s): FREET3  Other results:   Imaging    No results found.   Medications:     Scheduled Medications: . bisoprolol  5 mg Oral Daily  . dextromethorphan-guaiFENesin  1 tablet Oral BID  . diazepam  10 mg Oral Daily  . gabapentin  300 mg Oral BID  .  insulin aspart  0-15 Units Subcutaneous TID WC  . ipratropium-albuterol  3 mL Nebulization Q6H  . isosorbide-hydrALAZINE  1 tablet Oral TID  . mouth rinse  15 mL Mouth Rinse BID  . mometasone-formoterol  2 puff Inhalation BID  . pantoprazole (PROTONIX) IV  40 mg Intravenous QHS  . [START ON 12/06/2018] rosuvastatin  20 mg Oral Daily  . torsemide  60 mg Oral BID    Infusions: . heparin 1,250 Units/hr (12/05/18 0853)    PRN Medications: albuterol, docusate sodium, oxyCODONE-acetaminophen, sodium chloride flush    Patient Profile   Nicholas Caldwell is a 73 y.o. male with PMH of chronic systolic CHF/nonischemic  cardiomyopathy Echo 12/13/2016 LVEF 40-45%, COPD on home oxygen, PAFs/p DCCV 2018,and history of non compliance.  Admitted overnight with acute respiratory failure, acute renal failure, and hypotension.   Assessment/Plan   1. Acute on chronic hypercarbic/hypoxemic respiratory failure: He is on home oxygen at baseline.  Patient was intubated at admission. Likely multifactorial .He initially appeared volume overloaded on exam with pulmonary edema on CXR. Also concern for oversedation from home meds and COPD.   - Much improved with diuresis and treatment of COPD. Doing well on [place] - Will need eventual sleep study. Was scheduled for 11/30/2018. - Will hold diuretics today with cath tomorrow. Gentle hydration.  2. Acute on chronic systolic CHF: Echo in 6/94 with EF 40-45%, mild RV dilation.  Nonischemic cardiomyopathy.  He was admitted with hypotension and was initially on norepinephrine.  Pulmonary edema on initial CXR and volume overloaded on exam.  Also with AKI at admission.  - Echo 12/02/2018 LVEF stable at 40-45%.  - Plan [facility] tomorrow  - Weight and CVP stable. Will hold diuretics today with cath tomorrow. Gentle hydration.  - Coox 72%.  - BP running up into 140-160s.  - Holding lisinopril and spironolactone with AKI. Now improving. Will hold with pending cath. Consider Entresto post cath. - Continue Bidil 1 tab tid - Continue bisoprolol  5 mg daily.  - BP running 100-120. Suspect he is dry. Hydrate gently pre cath 3. AKI likely due to ATN: Baseline creatinine 1.0.  - Creatinine 3.48 on 12/01/18 - Creatinine now stabilizing in 1.2-1.3 range but up to 1.6 today.  - Holding lisinopril and spironolactone with AKI, - Suspect he is dry. Hold diuretics. Hydrate gently pre cath 4. Shock/hypotension: Presented with low BP. No fever and WBCs near normal.  - Unclear at this time what caused his presentation.  - Now off NE - Coox 71% 5. COPD:  - Per CCM. No wheeze on exam.  6. Atrial flutter;  recurrent.  - Likely in setting of acute illness. Now rate controlled  - He has previously failed amiodarone due to Prolonged QT. Not tikosyn candidate for same reason.  - Xarelto on hold for cath. Remains oh heparin. Hgb drifting down. No overt bleeding. Watch closely  - Possible DC-CV prior to d/c 7. Elevated troponin: Mild troponin elevation with no trend.   - Likely demand ischemia with volume overload/hypotension.  - Plan [facility] tomorrow renal function permitting  8. Hypokalemia - will supp   Medication concerns reviewed with patient and pharmacy team. Barriers identified: None at this time.   Length of Stay: 4  Glori Bickers, MD  12/05/2018, 1:05 PM  Advanced Heart Failure Team Pager [phone] (M-F; [place])  Please contact [facility] for night-coverage after hours (4p -7a ) and weekends on [email or web address]

## 2018-12-05 NOTE — Progress Notes (Signed)
Patient is requesting drinks more frequently, patient is educated about the 1217m fluid restriction and why it is very important for him to keep it at that level because of his heart failure. At this time patient total intake during my shift is 8458m Will continue to reinforce the education about fluid intake.

## 2018-12-06 ENCOUNTER — Encounter (HOSPITAL_COMMUNITY)
Admission: EM | Disposition: A | Discharge status: Home Health | Payer: Self-pay | Source: Home / Self Care | Attending: Internal Medicine

## 2018-12-06 ENCOUNTER — Encounter (HOSPITAL_COMMUNITY): Payer: Self-pay | Admitting: Cardiology

## 2018-12-06 DIAGNOSIS — I509 Heart failure, unspecified: Secondary | ICD-10-CM

## 2018-12-06 DIAGNOSIS — I2510 Atherosclerotic heart disease of native coronary artery without angina pectoris: Secondary | ICD-10-CM

## 2018-12-06 HISTORY — PX: RIGHT/LEFT HEART CATH AND CORONARY ANGIOGRAPHY: CATH118266

## 2018-12-06 LAB — IRON AND TIBC
Iron: 47 ug/dL (ref 45–182)
Saturation Ratios: 15 % — ABNORMAL LOW (ref 17.9–39.5)
TIBC: 311 ug/dL (ref 250–450)
UIBC: 264 ug/dL

## 2018-12-06 LAB — BASIC METABOLIC PANEL
Anion gap: 11 (ref 5–15)
BUN: 52 mg/dL — ABNORMAL HIGH (ref 8–23)
CALCIUM: 7.8 mg/dL — AB (ref 8.9–10.3)
CO2: 34 mmol/L — ABNORMAL HIGH (ref 22–32)
Chloride: 97 mmol/L — ABNORMAL LOW (ref 98–111)
Creatinine, Ser: 1.49 mg/dL — ABNORMAL HIGH (ref 0.61–1.24)
GFR calc Af Amer: 54 mL/min — ABNORMAL LOW (ref >60)
GFR calc non Af Amer: 47 mL/min — ABNORMAL LOW (ref >60)
Glucose, Bld: 205 mg/dL — ABNORMAL HIGH (ref 70–99)
Potassium: 3.5 mmol/L (ref 3.5–5.1)
Sodium: 142 mmol/L (ref 135–145)

## 2018-12-06 LAB — CULTURE, BLOOD (ROUTINE X 2)
Culture: NO GROWTH
Culture: NO GROWTH
Special Requests: ADEQUATE

## 2018-12-06 LAB — POCT I-STAT EG7
ACID-BASE EXCESS: 10 mmol/L — AB (ref 0.0–2.0)
Acid-Base Excess: 9 mmol/L — ABNORMAL HIGH (ref 0.0–2.0)
BICARBONATE: 35.6 mmol/L — AB (ref 20.0–28.0)
Bicarbonate: 36.3 mmol/L — ABNORMAL HIGH (ref 20.0–28.0)
Calcium, Ion: 1.07 mmol/L — ABNORMAL LOW (ref 1.15–1.40)
Calcium, Ion: 1.1 mmol/L — ABNORMAL LOW (ref 1.15–1.40)
HCT: 28 % — ABNORMAL LOW (ref 39.0–52.0)
HCT: 29 % — ABNORMAL LOW (ref 39.0–52.0)
Hemoglobin: 9.5 g/dL — ABNORMAL LOW (ref 13.0–17.0)
Hemoglobin: 9.9 g/dL — ABNORMAL LOW (ref 13.0–17.0)
O2 Saturation: 65 %
O2 Saturation: 66 %
PCO2 VEN: 60.3 mmHg — AB (ref 44.0–60.0)
Potassium: 3.2 mmol/L — ABNORMAL LOW (ref 3.5–5.1)
Potassium: 3.3 mmol/L — ABNORMAL LOW (ref 3.5–5.1)
Sodium: 140 mmol/L (ref 135–145)
Sodium: 141 mmol/L (ref 135–145)
TCO2: 37 mmol/L — ABNORMAL HIGH (ref 22–32)
TCO2: 38 mmol/L — ABNORMAL HIGH (ref 22–32)
pCO2, Ven: 60.9 mmHg — ABNORMAL HIGH (ref 44.0–60.0)
pH, Ven: 7.379 (ref 7.250–7.430)
pH, Ven: 7.383 (ref 7.250–7.430)
pO2, Ven: 35 mmHg (ref 32.0–45.0)
pO2, Ven: 36 mmHg (ref 32.0–45.0)

## 2018-12-06 LAB — CBC
HCT: 28.3 % — ABNORMAL LOW (ref 39.0–52.0)
Hemoglobin: 8.2 g/dL — ABNORMAL LOW (ref 13.0–17.0)
MCH: 25.4 pg — ABNORMAL LOW (ref 26.0–34.0)
MCHC: 29 g/dL — ABNORMAL LOW (ref 30.0–36.0)
MCV: 87.6 fL (ref 80.0–100.0)
Platelets: 277 10*3/uL (ref 150–400)
RBC: 3.23 MIL/uL — ABNORMAL LOW (ref 4.22–5.81)
RDW: 17.2 % — ABNORMAL HIGH (ref 11.5–15.5)
WBC: 13.2 10*3/uL — ABNORMAL HIGH (ref 4.0–10.5)
nRBC: 0 % (ref 0.0–0.2)

## 2018-12-06 LAB — APTT: aPTT: 62 seconds — ABNORMAL HIGH (ref 24–36)

## 2018-12-06 LAB — FERRITIN: Ferritin: 58 ng/mL (ref 24–336)

## 2018-12-06 LAB — GLUCOSE, CAPILLARY
Glucose-Capillary: 171 mg/dL — ABNORMAL HIGH (ref 70–99)
Glucose-Capillary: 163 mg/dL — ABNORMAL HIGH (ref 70–99)
Glucose-Capillary: 214 mg/dL — ABNORMAL HIGH (ref 70–99)
Glucose-Capillary: 255 mg/dL — ABNORMAL HIGH (ref 70–99)

## 2018-12-06 LAB — COOXEMETRY PANEL
Carboxyhemoglobin: 1.4 % (ref 0.5–1.5)
METHEMOGLOBIN: 1.8 % — AB (ref 0.0–1.5)
O2 Saturation: 76.3 %
Total hemoglobin: 8.3 g/dL — ABNORMAL LOW (ref 12.0–16.0)

## 2018-12-06 LAB — HEPARIN LEVEL (UNFRACTIONATED): Heparin Unfractionated: 0.51 IU/mL (ref 0.30–0.70)

## 2018-12-06 SURGERY — RIGHT/LEFT HEART CATH AND CORONARY ANGIOGRAPHY
Anesthesia: LOCAL

## 2018-12-06 MED ORDER — GUAIFENESIN-DM 100-10 MG/5ML PO SYRP
5.0000 mL | ORAL_SOLUTION | ORAL | Status: DC | PRN
  Administered 2018-12-06 – 2018-12-08 (×6): 5 mL via ORAL
  Filled 2018-12-06 (×6): qty 5

## 2018-12-06 MED ORDER — PANTOPRAZOLE SODIUM 40 MG PO TBEC
40.0000 mg | DELAYED_RELEASE_TABLET | Freq: Every day | ORAL | Status: DC
  Administered 2018-12-06 – 2018-12-11 (×6): 40 mg via ORAL
  Filled 2018-12-06 (×7): qty 1

## 2018-12-06 MED ORDER — LIDOCAINE HCL (PF) 1 % IJ SOLN
INTRAMUSCULAR | Status: DC | PRN
  Administered 2018-12-06 (×2): 2 mL

## 2018-12-06 MED ORDER — LIDOCAINE HCL (PF) 1 % IJ SOLN
INTRAMUSCULAR | Status: AC
  Filled 2018-12-06: qty 30

## 2018-12-06 MED ORDER — RIVAROXABAN 20 MG PO TABS
20.0000 mg | ORAL_TABLET | Freq: Every day | ORAL | Status: DC
  Administered 2018-12-06 – 2018-12-11 (×6): 20 mg via ORAL
  Filled 2018-12-06 (×6): qty 1

## 2018-12-06 MED ORDER — MIDAZOLAM HCL 2 MG/2ML IJ SOLN
INTRAMUSCULAR | Status: AC
  Filled 2018-12-06: qty 2

## 2018-12-06 MED ORDER — FENTANYL CITRATE (PF) 100 MCG/2ML IJ SOLN
INTRAMUSCULAR | Status: DC | PRN
  Administered 2018-12-06 (×2): 25 ug

## 2018-12-06 MED ORDER — SODIUM CHLORIDE 0.9% FLUSH
3.0000 mL | Freq: Two times a day (BID) | INTRAVENOUS | Status: DC
  Administered 2018-12-06 – 2018-12-12 (×12): 3 mL via INTRAVENOUS

## 2018-12-06 MED ORDER — MIDAZOLAM HCL 2 MG/2ML IJ SOLN
INTRAMUSCULAR | Status: DC | PRN
  Administered 2018-12-06 (×2): 1 mg

## 2018-12-06 MED ORDER — ONDANSETRON HCL 4 MG/2ML IJ SOLN: 4.0000 mg | Freq: Four times a day (QID) | INTRAMUSCULAR | Status: DC | PRN

## 2018-12-06 MED ORDER — HEPARIN SODIUM (PORCINE) 1000 UNIT/ML IJ SOLN
INTRAMUSCULAR | Status: AC
  Filled 2018-12-06: qty 1

## 2018-12-06 MED ORDER — INSULIN GLARGINE 100 UNIT/ML ~~LOC~~ SOLN
10.0000 [IU] | Freq: Every day | SUBCUTANEOUS | Status: DC
  Administered 2018-12-06 – 2018-12-07 (×2): 10 [IU] via SUBCUTANEOUS
  Filled 2018-12-06 (×2): qty 0.1

## 2018-12-06 MED ORDER — HEPARIN SODIUM (PORCINE) 1000 UNIT/ML IJ SOLN
INTRAMUSCULAR | Status: DC | PRN
  Administered 2018-12-06: 5000 [IU] via INTRAVENOUS

## 2018-12-06 MED ORDER — VERAPAMIL HCL 2.5 MG/ML IV SOLN
INTRAVENOUS | Status: AC
  Filled 2018-12-06: qty 2

## 2018-12-06 MED ORDER — SODIUM CHLORIDE 0.9 % IV SOLN
INTRAVENOUS | Status: AC
  Administered 2018-12-06: 11:00:00 via INTRAVENOUS

## 2018-12-06 MED ORDER — VERAPAMIL HCL 2.5 MG/ML IV SOLN
INTRAVENOUS | Status: DC | PRN
  Administered 2018-12-06: 10:00:00 via INTRA_ARTERIAL

## 2018-12-06 MED ORDER — FENTANYL CITRATE (PF) 100 MCG/2ML IJ SOLN
INTRAMUSCULAR | Status: AC
  Filled 2018-12-06: qty 2

## 2018-12-06 MED ORDER — HEPARIN (PORCINE) IN NACL 1000-0.9 UT/500ML-% IV SOLN
INTRAVENOUS | Status: DC | PRN
  Administered 2018-12-06 (×4): 500 mL

## 2018-12-06 MED ORDER — SODIUM CHLORIDE 0.9 % IV SOLN
250.0000 mL | INTRAVENOUS | Status: DC | PRN
  Administered 2018-12-07 – 2018-12-09 (×5): 250 mL via INTRAVENOUS

## 2018-12-06 MED ORDER — ACETAMINOPHEN 325 MG PO TABS: 650.0000 mg | ORAL_TABLET | ORAL | Status: DC | PRN

## 2018-12-06 MED ORDER — SODIUM CHLORIDE 0.9% FLUSH
3.0000 mL | INTRAVENOUS | Status: DC | PRN
  Administered 2018-12-11: 3 mL via INTRAVENOUS
  Filled 2018-12-06: qty 3

## 2018-12-06 MED ORDER — IOHEXOL 350 MG/ML SOLN
INTRAVENOUS | Status: DC | PRN
  Administered 2018-12-06: 55 mL via INTRACARDIAC

## 2018-12-06 MED ORDER — HEPARIN (PORCINE) IN NACL 1000-0.9 UT/500ML-% IV SOLN
INTRAVENOUS | Status: AC
  Filled 2018-12-06: qty 1000

## 2018-12-06 SURGICAL SUPPLY — 11 items
CATH 5FR JL3.5 JR4 ANG PIG MP (CATHETERS) ×1 IMPLANT
CATH BALLN WEDGE 5F 110CM (CATHETERS) ×1 IMPLANT
CATH LAUNCHER 5F EBU3.5 (CATHETERS) ×1 IMPLANT
DEVICE RAD COMP TR BAND LRG (VASCULAR PRODUCTS) ×1 IMPLANT
GLIDESHEATH SLEND SS 6F .021 (SHEATH) ×1 IMPLANT
GUIDEWIRE .025 260CM (WIRE) ×1 IMPLANT
KIT HEART LEFT (KITS) ×2 IMPLANT
PACK CARDIAC CATHETERIZATION (CUSTOM PROCEDURE TRAY) ×2 IMPLANT
SHEATH GLIDE SLENDER 4/5FR (SHEATH) ×1 IMPLANT
TRANSDUCER W/STOPCOCK (MISCELLANEOUS) ×2 IMPLANT
TUBING CIL FLEX 10 FLL-RA (TUBING) ×2 IMPLANT

## 2018-12-06 NOTE — Progress Notes (Signed)
Patient has been transported to cath lab on 4 l of 02 off monitor CCMD aware. Wife went for breakfast not with patient.

## 2018-12-06 NOTE — Progress Notes (Addendum)
PROGRESS NOTE    Nicholas Caldwell  AQL:[id number] DOB: February 25, 1946 DOA: 11/30/2018 PCP: [facility]     Brief Narrative:  Nicholas Caldwell is a 73 yo with hx of A fib, COPD, systolic CHF, and chronic pain reportedly took extra pain medications before going to bed.  Afterward his wife was worried that he was getting too sleepy and shaking, and that his blood pressure might be too low.  He was brought to the ER and intubated due to respiratory failure and airway protection.  He was found to have hyperkalemia and acute renal failure.  He takes valium, neurontin, and percocet at home.  He was consulted for acute on chronic systolic heart failure.  He was eventually extubated and transferred to [facility] 2/1.   New events last 24 hours / Subjective: Cough with yellow sputum. No chest pain or SOB.   Assessment & Plan:   Principal Problem:   Acute respiratory failure with hypoxia and hypercapnia (HCC) Active Problems:   Essential hypertension   Type 2 diabetes mellitus with neuropathy   Hyperlipidemia    COPD exacerbation (HCC)   PAF (paroxysmal atrial fibrillation) (HCC)   Nonischemic cardiomyopathy (HCC)   CAD - Non-obstructive by LHC1/16   Acute on chronic combined systolic and diastolic CHF (congestive heart failure) (HCC)   Acute pulmonary edema (HCC)   AKI (acute kidney injury) (HCC)   Acute on chronic hypoxemic and hypercapnic respiratory failure secondary to COPD, undiagnosed OSA, polypharmacy -Has been extubated and now on his baseline nasal cannula O2 -Finished prednisone course for COPD  -Had outpatient sleep study scheduled for 1/28 which was canceled due to hospitalization.  Will need to reschedule upon discharge home  Acute on chronic systolic heart failure -EF 40 to 45% -Central line in place to follow [facility]/CVP -Heart failure team following, planning for heart cath today  -Bisoprolol resumed -Holding spironolactone, lisinopril due to acute kidney injury -Continue  torsemide BID  CAD -Continue BiDil  COPD exacerbation -Continue Dulera, prednisone course completed  Shock -?Cardiogenic  -Resolved.  Bisoprolol resumed  Acute toxic encephalopathy -Likely secondary to polypharmacy and respiratory failure -Resolved and back to baseline  Acute kidney injury on CKD stage III -Baseline creatinine 1 -Creatinine on admission 3.48 and trending downward, improved today 1.49   Paroxysmal atrial fibrillation -Hold Xarelto, on IV heparin currently -Due to prolonged QTC, not a good candidate for amiodarone, Tikosyn  Hyperlipidemia -Continue Crestor  Demand ischemia -Due to acute illness  Seizure disorder -Resume Valium, watch patient's mentation closely  Peripheral neuropathy -Neurontin  Leukocytosis -Patient just completed prednisone course.  Continue to monitor, no concern for infection at this point.  Afebrile.  DM type 2 -Lantus, SSI    DVT prophylaxis: IV heparin, holding Xarelto for now Code Status: Full code Family Communication: Family member sleeping at bedside Disposition Plan: Pending further improvement, heart cath planned today    Consultants:   Critical care  Advanced heart failure  Procedures:   Central line placed 1/29  Antimicrobials:  Anti-infectives (From admission, onward)   None       Objective: Vitals:   12/06/18 1017 12/06/18 1022 12/06/18 1027 12/06/18 1028  BP: 117/61 (!) 108/59 122/63 121/65  Pulse: 84 83 84 82  Resp: _0 Temp:      TempSrc:      SpO2: 96% 96% 98% 97%  Weight:      Height:        Intake/Output Summary (Last 24 hours) at 12/06/2018 1046  Last data filed at 12/06/2018 0620 Gross per 24 hour  Intake 1428 ml  Output 1350 ml  Net 78 ml   Filed Weights   12/03/18 2119 12/05/18 0436 12/06/18 0618  Weight: 118.9 kg 119.4 kg 121.1 kg    Examination: General exam: Appears calm and comfortable  Respiratory system: Clear to auscultation, no wheeze. Respiratory effort  normal. On [place] O2.  Cardiovascular system: S1 & S2 heard. No JVD, murmurs, rubs, gallops or clicks. No pedal edema. Gastrointestinal system: Abdomen is nondistended, soft and nontender. No organomegaly or masses felt. Normal bowel sounds heard. Central nervous system: Alert and oriented. No focal neurological deficits. Extremities: Symmetric 5 x 5 power. Skin: No rashes, lesions or ulcers Psychiatry: Judgement and insight appear normal. Mood & affect appropriate.    Data Reviewed: I have personally reviewed following labs and imaging studies  CBC: Recent Labs  Lab 12/01/18 0011  12/02/18 0636 12/03/18 0447 12/04/18 0630 12/05/18 0448 12/06/18 0343  WBC 11.3*  --  7.0 7.9 8.9 12.8* 13.2*  NEUTROABS 9.7*  --   --   --   --   --   --   HGB 9.4*   < > 10.1* 9.1* 9.1* 8.7* 8.2*  HCT 32.5*   < > 32.1* 29.8* 29.6* 30.1* 28.3*  MCV 90.5  --  82.5 86.1 86.8 87.0 87.6  PLT 328  --  374 315 306 301 277   < > = values in this interval not displayed.   Basic Metabolic Panel: Recent Labs  Lab 12/01/18 0408  12/02/18 0342 12/02/18 0419 12/03/18 0447 12/04/18 0630 12/05/18 0448 12/06/18 0343  NA 135   < > 140 138 144 143 139 142  K 5.4*   < > 4.7 4.8 3.8 3.5 3.5 3.5  CL 90*   < > 95*  --  98 97* 93* 97*  CO2 26   < > 31  --  33* 35* 33* 34*  GLUCOSE 78   < > 167*  --  162* 183* 189* 205*  BUN 96*   < > 80*  --  65* 56* 55* 52*  CREATININE 3.15*   < > 1.56*  --  1.27* 1.36* 1.63* 1.49*  CALCIUM 8.8*   < > 9.0  --  8.8* 8.5* 7.9* 7.8*  MG 2.9*  --   --   --   --   --   --   --   PHOS 5.5*  --   --   --   --   --   --   --    < > = values in this interval not displayed.   GFR: Estimated Creatinine Clearance: 62 mL/min (A) (by C-G formula based on SCr of 1.49 mg/dL (H)). Liver Function Tests: No results for input(s): AST, ALT, ALKPHOS, BILITOT, PROT, ALBUMIN in the last 168 hours. No results for input(s): LIPASE, AMYLASE in the last 168 hours. No results for input(s): AMMONIA in the  last 168 hours. Coagulation Profile: No results for input(s): INR, PROTIME in the last 168 hours. Cardiac Enzymes: Recent Labs  Lab 12/01/18 0408 12/01/18 1206  TROPONINI 0.36* 0.42*   BNP (last 3 results) No results for input(s): PROBNP in the last 8760 hours. HbA1C: No results for input(s): HGBA1C in the last 72 hours. CBG: Recent Labs  Lab 12/05/18 0813 12/05/18 1222 12/05/18 1656 12/05/18 2210 12/06/18 0808  GLUCAP 178* 215* 278* 318* 171*   Lipid Profile: No results for input(s): CHOL, HDL, LDLCALC,  TRIG, CHOLHDL, LDLDIRECT in the last 72 hours. Thyroid Function Tests: No results for input(s): TSH, T4TOTAL, FREET4, T3FREE, THYROIDAB in the last 72 hours. Anemia Panel: No results for input(s): VITAMINB12, FOLATE, FERRITIN, TIBC, IRON, RETICCTPCT in the last 72 hours. Sepsis Labs: No results for input(s): PROCALCITON, LATICACIDVEN in the last 168 hours.  Recent Results (from the past 240 hour(s))  Culture, blood (routine x 2)     Status: None   Collection Time: 12/01/18  4:00 PM  Result Value Ref Range Status   Specimen Description BLOOD RIGHT HAND  Final   Special Requests   Final    BOTTLES DRAWN AEROBIC ONLY Blood Culture adequate volume   Culture   Final    NO GROWTH 5 DAYS Performed at [facility], [address], [place], [place] [address]    Report Status 12/06/2018 FINAL  Final  Culture, respiratory (non-expectorated)     Status: None   Collection Time: 12/01/18  4:03 PM  Result Value Ref Range Status   Specimen Description TRACHEAL ASPIRATE  Final   Special Requests NONE  Final   Gram Stain   Final    RARE WBC PRESENT, PREDOMINANTLY PMN RARE GRAM POSITIVE COCCI RARE GRAM POSITIVE RODS Performed at [facility], [place] [address], [place], [place] [address]    Culture FEW Consistent with normal respiratory flora.  Final   Report Status 12/04/2018 FINAL  Final  Culture, blood (routine x 2)     Status: None   Collection Time: 12/01/18   4:10 PM  Result Value Ref Range Status   Specimen Description BLOOD RIGHT HAND  Final   Special Requests   Final    BOTTLES DRAWN AEROBIC ONLY Blood Culture results may not be optimal due to an inadequate volume of blood received in culture bottles   Culture   Final    NO GROWTH 5 DAYS Performed at [facility], [place] [address] [place], [place] [address]    Report Status 12/06/2018 FINAL  Final       Radiology Studies: No results found.    Scheduled Meds: . [MAR Hold] bisoprolol  5 mg Oral Daily  . [MAR Hold] diazepam  10 mg Oral Daily  . [MAR Hold] gabapentin  300 mg Oral BID  . [MAR Hold] insulin aspart  0-15 Units Subcutaneous TID WC  . [MAR Hold] insulin glargine  10 Units Subcutaneous Daily  . [MAR Hold] ipratropium-albuterol  3 mL Nebulization Q6H  . [MAR Hold] isosorbide-hydrALAZINE  1 tablet Oral TID  . [MAR Hold] mouth rinse  15 mL Mouth Rinse BID  . [MAR Hold] mometasone-formoterol  2 puff Inhalation BID  . [MAR Hold] pantoprazole (PROTONIX) IV  40 mg Intravenous QHS  . [MAR Hold] rosuvastatin  20 mg Oral Daily  . sodium chloride flush  3 mL Intravenous Q12H   Continuous Infusions: . sodium chloride    . sodium chloride Stopped (12/06/18 0028)  . sodium chloride 75 mL/hr at 12/06/18 0535     LOS: 5 days    Time spent: 25 minutes   Dessa Phi, DO [facility] [email or web address] 12/06/2018, 10:46 AM

## 2018-12-06 NOTE — Interval H&P Note (Signed)
History and Physical Interval Note:  12/06/2018 9:48 AM  Nicholas Caldwell  has presented today for surgery, with the diagnosis of HF  The various methods of treatment have been discussed with the patient and family. After consideration of risks, benefits and other options for treatment, the patient has consented to  Procedure(s): RIGHT/LEFT HEART CATH AND CORONARY ANGIOGRAPHY (N/A) as a surgical intervention .  The patient's history has been reviewed, patient examined, no change in status, stable for surgery.  I have reviewed the patient's chart and labs.  Questions were answered to the patient's satisfaction.     Jakory Matsuo [employer]

## 2018-12-06 NOTE — Progress Notes (Signed)
OT Cancellation Note  Patient Details Name: Divante Kotch MRN: [id number] DOB: 06/30/1946   Cancelled Treatment:    Reason Eval/Treat Not Completed: Patient at procedure or test/ unavailable(Cath lab. Will follow.)  Malka So 12/06/2018, 10:33 AM  Nestor Lewandowsky, OTR/L Acute Rehabilitation Services Pager: [phone] Office: [phone]

## 2018-12-06 NOTE — Progress Notes (Signed)
Patient ID: Nicholas Caldwell, male   DOB: 14-Apr-1946, 73 y.o.   MRN: [id number]     Advanced Heart Failure Rounding Note  PCP-Cardiologist: No primary care provider on file.   Subjective:    Patient denies dyspnea at rest today.  Hemoglobin down to 8.2, no overt bleeding.  Creatinine down to 1.49. He remains in atrial fibrillation with controlled rate.    RHC/LHC done today as below:  Coronary Findings   Diagnostic  Dominance: Co-dominant  Left Main  No significant disease.  Left Anterior Descending  50-60% distal LAD stenosis.  Ramus Intermedius  Moderate vessel, no significant disease.  Left Circumflex  50% proximal LCx stenosis. Large vessel, co-dominant.  Right Coronary Artery  Relatively small RCA. 50% proximal stenosis.  Intervention   No interventions have been documented.  Right Heart   Right Heart Pressures RHC Procedural Findings: Hemodynamics (mmHg) RA mean 10 RV 56/9 PA 52/16, mean 30 PCWP mean 10 LV 95/10 AO 94/48  Oxygen saturations: PA 65% AO 96%  Cardiac Output (Fick) 9.2  Cardiac Index (Fick) 3.85 PVR 2.2 WU     Objective:   Weight Range: 121.1 kg Body mass index is 36.21 kg/m.   Vital Signs:   Temp:  [98.4 F (36.9 C)-98.7 F (37.1 C)] 98.4 F (36.9 C) (02/03 0618) Pulse Rate:  [0-295] 82 (02/03 1028) Resp:  [9-29] 14 (02/03 1028) BP: (108-143)/(59-72) 121/65 (02/03 1028) SpO2:  [0 %-100 %] 97 % (02/03 1028) Weight:  [121.1 kg] 121.1 kg (02/03 0618) Last BM Date: 12/04/18  Weight change: Filed Weights   12/03/18 2119 12/05/18 0436 12/06/18 0618  Weight: 118.9 kg 119.4 kg 121.1 kg   Intake/Output:   Intake/Output Summary (Last 24 hours) at 12/06/2018 1046 Last data filed at 12/06/2018 0620 Gross per 24 hour  Intake 1428 ml  Output 1350 ml  Net 78 ml    Physical Exam   General: NAD Neck: No JVD, no thyromegaly or thyroid nodule.  Lungs: Clear to auscultation bilaterally with normal respiratory effort. CV: Nondisplaced PMI.   Heartir regular S1/S2, no S3/S4, no murmur.  No peripheral edema.   Abdomen: Soft, nontender, no hepatosplenomegaly, no distention.  Skin: Intact without lesions or rashes.  Neurologic: Alert and oriented x 3.  Psych: Normal affect. Extremities: No clubbing or cyanosis.  HEENT: Normal.    Telemetry   Atrial fibrillation with controlled VR in 80-90s, personally reviewed.   EKG   No new tracings.    Labs    CBC Recent Labs    12/05/18 0448 12/06/18 0343  WBC 12.8* 13.2*  HGB 8.7* 8.2*  HCT 30.1* 28.3*  MCV 87.0 87.6  PLT 301 373   Basic Metabolic Panel Recent Labs    12/05/18 0448 12/06/18 0343  NA 139 142  K 3.5 3.5  CL 93* 97*  CO2 33* 34*  GLUCOSE 189* 205*  BUN 55* 52*  CREATININE 1.63* 1.49*  CALCIUM 7.9* 7.8*   Liver Function Tests No results for input(s): AST, ALT, ALKPHOS, BILITOT, PROT, ALBUMIN in the last 72 hours. No results for input(s): LIPASE, AMYLASE in the last 72 hours. Cardiac Enzymes No results for input(s): CKTOTAL, CKMB, CKMBINDEX, TROPONINI in the last 72 hours.  BNP: BNP (last 3 results) Recent Labs    11/16/18 0237 11/21/18 2238 12/01/18 0015  BNP 606.1* 1,739.7* 1,666.7*    ProBNP (last 3 results) No results for input(s): PROBNP in the last 8760 hours.   D-Dimer No results for input(s): DDIMER in the last  72 hours. Hemoglobin A1C No results for input(s): HGBA1C in the last 72 hours. Fasting Lipid Panel No results for input(s): CHOL, HDL, LDLCALC, TRIG, CHOLHDL, LDLDIRECT in the last 72 hours. Thyroid Function Tests No results for input(s): TSH, T4TOTAL, T3FREE, THYROIDAB in the last 72 hours.  Invalid input(s): FREET3  Other results:   Imaging    No results found.   Medications:     Scheduled Medications: . [MAR Hold] bisoprolol  5 mg Oral Daily  . [MAR Hold] diazepam  10 mg Oral Daily  . [MAR Hold] gabapentin  300 mg Oral BID  . [MAR Hold] insulin aspart  0-15 Units Subcutaneous TID WC  . [MAR Hold]  insulin glargine  10 Units Subcutaneous Daily  . [MAR Hold] ipratropium-albuterol  3 mL Nebulization Q6H  . [MAR Hold] isosorbide-hydrALAZINE  1 tablet Oral TID  . [MAR Hold] mouth rinse  15 mL Mouth Rinse BID  . [MAR Hold] mometasone-formoterol  2 puff Inhalation BID  . [MAR Hold] pantoprazole (PROTONIX) IV  40 mg Intravenous QHS  . [MAR Hold] rosuvastatin  20 mg Oral Daily  . sodium chloride flush  3 mL Intravenous Q12H    Infusions: . sodium chloride    . sodium chloride Stopped (12/06/18 0028)  . sodium chloride 75 mL/hr at 12/06/18 0535    PRN Medications: sodium chloride, [MAR Hold] albuterol, [MAR Hold] docusate sodium, [MAR Hold] guaiFENesin-dextromethorphan, [MAR Hold] oxyCODONE-acetaminophen, [MAR Hold] sodium chloride flush, sodium chloride flush    Patient Profile   Nicholas Caldwell is a 73 y.o. male with PMH of chronic systolic CHF/nonischemic cardiomyopathy Echo 12/13/2016 LVEF 40-45%, COPD on home oxygen, PAFs/p DCCV 2018,and history of non compliance.  Admitted overnight with acute respiratory failure, acute renal failure, and hypotension.   Assessment/Plan   1. Acute on chronic hypercarbic/hypoxemic respiratory failure: He is on home oxygen at baseline with COPD.  Patient was intubated at admission. Likely multifactorial .He initially appeared volume overloaded on exam with pulmonary edema on CXR. Also concern for oversedation from home pain meds and COPD.   - Much improved with diuresis and treatment of COPD. Doing well on [place] - Will need eventual sleep study. Was scheduled for 11/30/2018, will need to reschedule. - Restart torsemide 60 mg bid tomorrow (home dose).  2. Acute on chronic systolic CHF: Echo in 2/44 with EF 40-45%, mild RV dilation.  Nonischemic cardiomyopathy.  He was admitted with hypotension and was initially on norepinephrine.  Pulmonary edema on initial CXR and volume overloaded on exam.  Also with AKI at admission. Echo 12/02/2018 LVEF stable at  40-45%. RHC/LHC today with nonobstructive CAD, filling pressures near-optimized. Good cardiac output.  - Gentile hydration post-cath with recent AKI.  Restart torsemide at 60 mg po bid tomorrow.   - Holding lisinopril and spironolactone with AKI.  - Continue Bidil 1 tab tid - Continue bisoprolol  5 mg daily.  3. AKI likely due to ATN: Baseline creatinine 1.0. Creatinine 3.48 on 12/01/18.  Creatinine down to 1.49 today.    - Holding lisinopril and spironolactone with AKI, - Continue to hold diuretics today, gentle hydration post-cath.  4. Shock/hypotension: Presented with low BP, initially required norepinephrine. No fever and WBCs near normal.  Unclear at this time what caused his presentation, may have been over-sedation with home pain meds. Good cardiac output on RHC.  5. COPD: Per primary service. No wheeze on exam.  6. Atrial flutter/fibrillation: Recurrent. Likely in setting of acute illness. Now rate controlled.  He has previously  failed amiodarone due to markedly prolonged QT. Not tikosyn candidate for same reason.  - Restart Xarelto this evening.   - Continue current bisoprolol.  - I think he will need eventual cardioversion.  If he remains reasonably well-compensated, can wait for 3 wks of uninterrupted Xarelto and do DCCV.  Alternatively, could plan TEE-guided DCCV earlier than that.  7. Elevated troponin: Mild troponin elevation with no trend. Likely demand ischemia with volume overload/hypotension.  Coronary angiography showed nonobstructive CAD.  - Continue statin.   8. Anemia: Slow down-trend in hemoglobin, 8.2 today.  No overt bleeding.  - Guaiac stool.  - Iron studies.  - Watch CBC closely with Xarelto use.   Length of Stay: 5  Loralie Champagne, MD  12/06/2018, 10:46 AM  Advanced Heart Failure Team Pager [phone] (M-F; 7a - 4p)  Please contact [facility] for night-coverage after hours (4p -7a ) and weekends on [email or web address]

## 2018-12-06 NOTE — Progress Notes (Signed)
[facility] for Heparin >> Xarelto Indication: chest pain/ACS, AFib  No Known Allergies  Patient Measurements: Height: 6' (182.9 cm) Weight: 267 lb (121.1 kg) IBW/kg (Calculated) : 77.6 Heparin Dosing Weight: 106  Vital Signs: Temp: 98.4 F (36.9 C) (02/03 0618) Temp Source: Oral (02/03 0618) BP: 107/47 (02/03 1130) Pulse Rate: 96 (02/03 1129)  Labs: Recent Labs    12/04/18 0630 12/05/18 0448 12/06/18 0343  HGB 9.1* 8.7* 8.2*  HCT 29.6* 30.1* 28.3*  PLT 306 301 277  APTT 91* 81* 62*  HEPARINUNFRC 0.75* 0.66 0.51  CREATININE 1.36* 1.63* 1.49*    Estimated Creatinine Clearance: 62 mL/min (A) (by C-G formula based on SCr of 1.49 mg/dL (H)).   Medical History: Past Medical History:  Diagnosis Date  . Atrial flutter ([place])    a. recurrent AFlutter with RVR;  b. Amiodarone Rx started 4/16  . CAD (coronary artery disease)    a. LHC 1/16:  mLAD diffuse disease, pLCx mild disease, dLCx with disease but too small for PCI, RCA ok, EF 25-30%  . Chronic pain   . Chronic systolic CHF (congestive heart failure) ([place])   . COPD (chronic obstructive pulmonary disease) ([place])   . Diabetes mellitus without complication ([place])   . Hypercholesteremia   . Hypertension   . NICM (nonischemic cardiomyopathy) ([place])    a.dx 2016. b. 2D echo 06/2016 - Last echo 07/01/16: mod dilated LV, mod LVH, EF 25-30%, mild-mod MR, sev LAE, mild-mod reduced RV systolic function, mild-mod TR, PASP 45mHG.  .Marland KitchenPAF (paroxysmal atrial fibrillation) ([facility])    On amio - ot a candidate for flecainide due to cardiomyopathy, not a candidate for Tikosyn due to prolonged QT, and felt to be a poor candidate for ablation given left atrial size.  . Pulmonary hypertension ([place]   . Tobacco abuse     Medications:  Scheduled:  . bisoprolol  5 mg Oral Daily  . diazepam  10 mg Oral Daily  . gabapentin  300 mg Oral BID  . insulin aspart  0-15 Units Subcutaneous TID WC  . insulin  glargine  10 Units Subcutaneous Daily  . ipratropium-albuterol  3 mL Nebulization Q6H  . isosorbide-hydrALAZINE  1 tablet Oral TID  . mouth rinse  15 mL Mouth Rinse BID  . mometasone-formoterol  2 puff Inhalation BID  . pantoprazole (PROTONIX) IV  40 mg Intravenous QHS  . rosuvastatin  20 mg Oral Daily  . sodium chloride flush  3 mL Intravenous Q12H    Assessment: 73yo with hx of Afib (on xarleto PTA, last dose PTA ~1800 on 1/28) admitted with respiratory failure and possible ACS. Pt started on IV heparin and has now returned from cath lab with nonobstructive CAD. Pharmacy to resume Xarelto 8hr after cath lab.   Goal of Therapy:  Heparin level 0.3-0.7 units/ml aPTT 66-102 seconds Monitor platelets by anticoagulation protocol: Yes   Plan:  -Resume Xarelto 251mdaily with supper (give dose tonight at 1830) -Pharmacy will sign off, reconsult if needed  MiArrie SenatePharmD, BCPS Clinical Pharmacist [phone] check AMION for all [facility] 12/06/2018

## 2018-12-06 NOTE — Progress Notes (Signed)
Physical Therapy Treatment Patient Details Name: Nicholas Caldwell MRN: [id number] DOB: 20-Feb-1946 Today's Date: 12/06/2018    History of Present Illness 87M who initially presented with hypotension and lethargy and found with acute renal failure with hyperkalemia. Respiratory status worsened. Started on BiPAP however mental status poor and subsequently intubated for acute hypercarbic and hypoxemic respiratory failure PMH includes but not limited to: COPD, CHF, CAD, A flutter, PAF, cardioversion 9/19.     PT Comments    Patient progressing well towards PT goals. Improved ambulation distance with close min guard for balance/safery. Sp02 dropped to 83% on 4L/min 02 and HR up to 128 bpm with 2-3/4 DOE. 1 longer standing rest break with forearms leaning on RW due to fatigue in UEs. Would benefit from theraband while in the hospital to improve UE strength as pt reports using hand weights at home daily. Encouraged walking with nursing. Plan to cath today. Will follow.    Follow Up Recommendations  Home health PT;Supervision/Assistance - 24 hour     Equipment Recommendations  None recommended by PT    Recommendations for Other Services       Precautions / Restrictions Precautions Precautions: Fall Precaution Comments: monitor SPO2 Restrictions Weight Bearing Restrictions: No    Mobility  Bed Mobility Overal bed mobility: Modified Independent       Supine to sit: Modified independent (Device/Increase time);HOB elevated     General bed mobility comments: Increased time and use of rail but no assist needed.   Transfers Overall transfer level: Needs assistance Equipment used: Rolling walker (2 wheeled) Transfers: Sit to/from Stand Sit to Stand: Min guard         General transfer comment: Min guard for safety. Use of momentum and multiple attempts to stand from EOB but no assist needed.   Ambulation/Gait Ambulation/Gait assistance: Min guard Gait Distance (Feet): 150  Feet Assistive device: Rolling walker (2 wheeled) Gait Pattern/deviations: Step-through pattern;Decreased stride length;Trunk flexed Gait velocity: decreased   General Gait Details: Very slow, guarded gait. Heavy reliance on UEs for support; HR up to 128 bpm, Sp02 83% on 4L/min 02. Longer standing rest break with pt leaning on RW with UEs due to weakness UEs.    Stairs             Wheelchair Mobility    Modified Rankin (Stroke Patients Only)       Balance Overall balance assessment: Needs assistance Sitting-balance support: Feet supported;No upper extremity supported Sitting balance-Leahy Scale: Good Sitting balance - Comments: Able to reach down and adjust socks without difficulty.    Standing balance support: During functional activity;Bilateral upper extremity supported Standing balance-Leahy Scale: Poor Standing balance comment: reliant on UE support                            Cognition Arousal/Alertness: Awake/alert Behavior During Therapy: WFL for tasks assessed/performed Overall Cognitive Status: Within Functional Limits for tasks assessed                                        Exercises      General Comments        Pertinent Vitals/Pain Pain Assessment: No/denies pain    Home Living                      Prior Function  PT Goals (current goals can now be found in the care plan section) Progress towards PT goals: Progressing toward goals    Frequency    Min 3X/week      PT Plan Current plan remains appropriate    Co-evaluation              AM-PAC PT "6 Clicks" Mobility   Outcome Measure  Help needed turning from your back to your side while in a flat bed without using bedrails?: A Little Help needed moving from lying on your back to sitting on the side of a flat bed without using bedrails?: A Little Help needed moving to and from a bed to a chair (including a wheelchair)?: A  Little Help needed standing up from a chair using your arms (e.g., wheelchair or bedside chair)?: A Little Help needed to walk in hospital room?: A Little Help needed climbing 3-5 steps with a railing? : A Lot 6 Click Score: 17    End of Session Equipment Utilized During Treatment: Oxygen;Gait belt Activity Tolerance: Treatment limited secondary to medical complications (Comment)(drop in Sp02) Patient left: in bed;with call bell/phone within reach;with bed alarm set Nurse Communication: Mobility status PT Visit Diagnosis: Other abnormalities of gait and mobility (R26.89);Muscle weakness (generalized) (M62.81)     Time: 0289-0228 PT Time Calculation (min) (ACUTE ONLY): 20 min  Charges:  $Gait Training: 8-22 mins                     Wray Kearns, PT, DPT Acute Rehabilitation Services Pager [phone] Office [place] 12/06/2018, 8:59 AM

## 2018-12-06 NOTE — Plan of Care (Signed)
  Problem: Activity: ?Goal: Risk for activity intolerance will decrease ?Outcome: Progressing ?  ?Problem: Pain Managment: ?Goal: General experience of comfort will improve ?Outcome: Progressing ?  ?Problem: Safety: ?Goal: Ability to remain free from injury will improve ?Outcome: Progressing ?  ?

## 2018-12-06 NOTE — Progress Notes (Signed)
Patient's CBG was 318. Paged MD X2 and no new orders at this time.  Will continue to monitor.

## 2018-12-07 ENCOUNTER — Inpatient Hospital Stay (HOSPITAL_COMMUNITY): Payer: Medicare Other

## 2018-12-07 LAB — CBC WITH DIFFERENTIAL/PLATELET
Abs Immature Granulocytes: 0.13 10*3/uL — ABNORMAL HIGH (ref 0.00–0.07)
Basophils Absolute: 0 10*3/uL (ref 0.0–0.1)
Basophils Relative: 0 %
Eosinophils Absolute: 0 10*3/uL (ref 0.0–0.5)
Eosinophils Relative: 0 %
HCT: 28.4 % — ABNORMAL LOW (ref 39.0–52.0)
HEMOGLOBIN: 8.2 g/dL — AB (ref 13.0–17.0)
Immature Granulocytes: 1 %
Lymphocytes Relative: 6 %
Lymphs Abs: 1.3 10*3/uL (ref 0.7–4.0)
MCH: 25.7 pg — ABNORMAL LOW (ref 26.0–34.0)
MCHC: 28.9 g/dL — ABNORMAL LOW (ref 30.0–36.0)
MCV: 89 fL (ref 80.0–100.0)
Monocytes Absolute: 1.5 10*3/uL — ABNORMAL HIGH (ref 0.1–1.0)
Monocytes Relative: 7 %
Neutro Abs: 18.4 10*3/uL — ABNORMAL HIGH (ref 1.7–7.7)
Neutrophils Relative %: 86 %
Platelets: 250 10*3/uL (ref 150–400)
RBC: 3.19 MIL/uL — ABNORMAL LOW (ref 4.22–5.81)
RDW: 17.5 % — ABNORMAL HIGH (ref 11.5–15.5)
WBC: 21.4 10*3/uL — ABNORMAL HIGH (ref 4.0–10.5)
nRBC: 0 % (ref 0.0–0.2)

## 2018-12-07 LAB — CBC
HCT: 28.7 % — ABNORMAL LOW (ref 39.0–52.0)
Hemoglobin: 8.2 g/dL — ABNORMAL LOW (ref 13.0–17.0)
MCH: 25.2 pg — ABNORMAL LOW (ref 26.0–34.0)
MCHC: 28.6 g/dL — ABNORMAL LOW (ref 30.0–36.0)
MCV: 88 fL (ref 80.0–100.0)
Platelets: 260 10*3/uL (ref 150–400)
RBC: 3.26 MIL/uL — ABNORMAL LOW (ref 4.22–5.81)
RDW: 17.6 % — AB (ref 11.5–15.5)
WBC: 21.6 10*3/uL — ABNORMAL HIGH (ref 4.0–10.5)
nRBC: 0 % (ref 0.0–0.2)

## 2018-12-07 LAB — PROCALCITONIN: Procalcitonin: 0.17 ng/mL

## 2018-12-07 LAB — BASIC METABOLIC PANEL
Anion gap: 11 (ref 5–15)
BUN: 32 mg/dL — ABNORMAL HIGH (ref 8–23)
CO2: 33 mmol/L — AB (ref 22–32)
Calcium: 8 mg/dL — ABNORMAL LOW (ref 8.9–10.3)
Chloride: 96 mmol/L — ABNORMAL LOW (ref 98–111)
Creatinine, Ser: 1.4 mg/dL — ABNORMAL HIGH (ref 0.61–1.24)
GFR calc Af Amer: 59 mL/min — ABNORMAL LOW (ref >60)
GFR calc non Af Amer: 51 mL/min — ABNORMAL LOW (ref >60)
Glucose, Bld: 245 mg/dL — ABNORMAL HIGH (ref 70–99)
Potassium: 3.6 mmol/L (ref 3.5–5.1)
Sodium: 140 mmol/L (ref 135–145)

## 2018-12-07 LAB — GLUCOSE, CAPILLARY
Glucose-Capillary: 199 mg/dL — ABNORMAL HIGH (ref 70–99)
Glucose-Capillary: 224 mg/dL — ABNORMAL HIGH (ref 70–99)
Glucose-Capillary: 228 mg/dL — ABNORMAL HIGH (ref 70–99)
Glucose-Capillary: 209 mg/dL — ABNORMAL HIGH (ref 70–99)

## 2018-12-07 MED ORDER — SODIUM CHLORIDE 0.9 % IV SOLN
500.0000 mg | INTRAVENOUS | Status: DC
  Administered 2018-12-07 – 2018-12-08 (×2): 500 mg via INTRAVENOUS
  Filled 2018-12-07 (×2): qty 500

## 2018-12-07 MED ORDER — SODIUM CHLORIDE 0.9 % IV SOLN
510.0000 mg | Freq: Once | INTRAVENOUS | Status: AC
  Administered 2018-12-07: 510 mg via INTRAVENOUS
  Filled 2018-12-07: qty 17

## 2018-12-07 MED ORDER — TORSEMIDE 20 MG PO TABS
60.0000 mg | ORAL_TABLET | Freq: Two times a day (BID) | ORAL | Status: DC
  Administered 2018-12-07 (×2): 60 mg via ORAL
  Filled 2018-12-07 (×2): qty 3

## 2018-12-07 MED ORDER — SODIUM CHLORIDE 0.9 % IV SOLN
1.0000 g | INTRAVENOUS | Status: DC
  Filled 2018-12-07: qty 1

## 2018-12-07 MED ORDER — POTASSIUM CHLORIDE CRYS ER 20 MEQ PO TBCR
40.0000 meq | EXTENDED_RELEASE_TABLET | Freq: Once | ORAL | Status: AC
  Administered 2018-12-07: 40 meq via ORAL
  Filled 2018-12-07: qty 2

## 2018-12-07 MED ORDER — INSULIN ASPART 100 UNIT/ML ~~LOC~~ SOLN
5.0000 [IU] | Freq: Three times a day (TID) | SUBCUTANEOUS | Status: DC
  Administered 2018-12-07 – 2018-12-12 (×15): 5 [IU] via SUBCUTANEOUS

## 2018-12-07 MED ORDER — INSULIN GLARGINE 100 UNIT/ML ~~LOC~~ SOLN
15.0000 [IU] | Freq: Every day | SUBCUTANEOUS | Status: DC
  Administered 2018-12-08 – 2018-12-12 (×5): 15 [IU] via SUBCUTANEOUS
  Filled 2018-12-07 (×5): qty 0.15

## 2018-12-07 MED ORDER — SODIUM CHLORIDE 0.9 % IV SOLN
2.0000 g | Freq: Two times a day (BID) | INTRAVENOUS | Status: DC
  Administered 2018-12-07 – 2018-12-09 (×6): 2 g via INTRAVENOUS
  Filled 2018-12-07 (×7): qty 2

## 2018-12-07 MED ORDER — IPRATROPIUM-ALBUTEROL 0.5-2.5 (3) MG/3ML IN SOLN
3.0000 mL | Freq: Three times a day (TID) | RESPIRATORY_TRACT | Status: DC
  Administered 2018-12-08 – 2018-12-12 (×13): 3 mL via RESPIRATORY_TRACT
  Filled 2018-12-07 (×13): qty 3

## 2018-12-07 NOTE — Progress Notes (Signed)
Physical Therapy Treatment Patient Details Name: Nicholas Caldwell MRN: [id number] DOB: 1946-05-17 Today's Date: 12/07/2018    History of Present Illness Pt is a 73 y.o. male admitted 11/30/18 with hypotension and lethargy; found to have AKI with hyperkalemia. ETT 1/29-1/30. Also with CHF. S/p heart cath 2/3. PMH includes COPD, CHF, CAD, aflutter, PAF, peripheral neuropathy.   PT Comments    Pt limited by c/o increased fatigue this session. When initially declining hallway ambulation, pt offered other options for participation with PT treatment in room, including working on sit<>stand transfers using RW. Pt ultimately deciding to ambulate; able to do so with RW and min guard, required multiple rest breaks secondary to c/o pain and fatigue. SpO2 >92% on 4L O2 [place].   Follow Up Recommendations  Home health PT;Supervision/Assistance - 24 hour     Equipment Recommendations  None recommended by PT    Recommendations for Other Services       Precautions / Restrictions Precautions Precautions: Fall Precaution Comments: 3-4L O2 [place] baseline Restrictions Weight Bearing Restrictions: No    Mobility  Bed Mobility Overal bed mobility: Modified Independent             General bed mobility comments: Received sitting in bathroom; left seated in recliner  Transfers Overall transfer level: Needs assistance Equipment used: Rolling walker (2 wheeled) Transfers: Sit to/from Stand Sit to Stand: Supervision         General transfer comment: Reliant on momentum to power into standing from [facility] (over toilet) and raised bed height. Pt not willing to try standing from lower surface secondary to hip pain; got very upset when PT suggested trying so  Ambulation/Gait Ambulation/Gait assistance: Min guard Gait Distance (Feet): 100 Feet Assistive device: Rolling walker (2 wheeled) Gait Pattern/deviations: Step-through pattern;Decreased stride length;Trunk flexed Gait velocity: Decreased   General Gait  Details: Slow, labored gait with RW and min guard for balance. 2x standing rest breaks leaning on RW with UEs, additional seated rest break secondary to pain and fatigue. SpO2 >92% on 4L O2 [place]   Stairs             Wheelchair Mobility    Modified Rankin (Stroke Patients Only)       Balance Overall balance assessment: Needs assistance Sitting-balance support: Feet supported;No upper extremity supported Sitting balance-Leahy Scale: Fair       Standing balance-Leahy Scale: Poor Standing balance comment: reliant on UE support                            Cognition Arousal/Alertness: Awake/alert Behavior During Therapy: Flat affect;Agitated Overall Cognitive Status: Impaired/Different from baseline Area of Impairment: Attention;Following commands;Safety/judgement;Awareness;Problem solving                   Current Attention Level: Selective   Following Commands: Follows one step commands with increased time Safety/Judgement: Decreased awareness of safety;Decreased awareness of deficits Awareness: Emergent Problem Solving: Slow processing;Requires verbal cues General Comments: Poor insight into deficits, poor problem solving, difficult to reason with, not receptive to education/encouragement. "I'm going to sue you for having me walk" (pt given multiple options for LE therex/standing EOB activity instead of walking)      Exercises      General Comments General comments (skin integrity, edema, etc.): Wife present      Pertinent Vitals/Pain Pain Assessment: Faces Faces Pain Scale: Hurts little more Pain Location: Back, R hip, generalized Pain Descriptors / Indicators: Discomfort;Grimacing;Constant Pain Intervention(s): Monitored  during session;Limited activity within patient's tolerance    Home Living                      Prior Function            PT Goals (current goals can now be found in the care plan section) Acute Rehab PT  Goals Patient Stated Goal: Decreased pain and fatigue PT Goal Formulation: With patient Time For Goal Achievement: 12/17/18 Potential to Achieve Goals: Fair Progress towards PT goals: Not progressing toward goals - comment(Limited by fatigue)    Frequency    Min 3X/week      PT Plan Current plan remains appropriate    Co-evaluation              AM-PAC PT "6 Clicks" Mobility   Outcome Measure  Help needed turning from your back to your side while in a flat bed without using bedrails?: A Little Help needed moving from lying on your back to sitting on the side of a flat bed without using bedrails?: A Little Help needed moving to and from a bed to a chair (including a wheelchair)?: A Little Help needed standing up from a chair using your arms (e.g., wheelchair or bedside chair)?: A Little Help needed to walk in hospital room?: A Little Help needed climbing 3-5 steps with a railing? : A Lot 6 Click Score: 17    End of Session Equipment Utilized During Treatment: Gait belt;Oxygen Activity Tolerance: Patient limited by fatigue Patient left: in chair;with call bell/phone within reach;with family/visitor present Nurse Communication: Mobility status PT Visit Diagnosis: Other abnormalities of gait and mobility (R26.89);Muscle weakness (generalized) (M62.81)     Time: 1117-3567 PT Time Calculation (min) (ACUTE ONLY): 26 min  Charges:  $Gait Training: 8-22 mins                    Mabeline Caras, PT, DPT Acute Rehabilitation Services  Pager [phone] Office [place] 12/07/2018, 3:04 PM

## 2018-12-07 NOTE — Progress Notes (Signed)
PROGRESS NOTE    Nicholas Caldwell  HYW:[id number] DOB: 21-Apr-1946 DOA: 11/30/2018 PCP: [facility]     Brief Narrative:  Nicholas Caldwell is a 73 yo with hx of A fib, COPD, systolic CHF, and chronic pain reportedly took extra pain medications before going to bed.  Afterward his wife was worried that he was getting too sleepy and shaking, and that his blood pressure might be too low.  He was brought to the ER and intubated due to respiratory failure and airway protection.  He was found to have hyperkalemia and acute renal failure.  He takes valium, neurontin, and percocet at home.  He was consulted for acute on chronic systolic heart failure.  He was eventually extubated and transferred to [facility] 2/1. He underwent heart cath 2/3.    New events last 24 hours / Subjective: Continues to have cough with yellow sputum. Afebrile.   Assessment & Plan:   Principal Problem:   Acute respiratory failure with hypoxia and hypercapnia (HCC) Active Problems:   Essential hypertension   Type 2 diabetes mellitus with neuropathy   Hyperlipidemia    COPD exacerbation (HCC)   PAF (paroxysmal atrial fibrillation) (HCC)   Nonischemic cardiomyopathy (HCC)   CAD - Non-obstructive by LHC1/16   Acute on chronic combined systolic and diastolic CHF (congestive heart failure) (HCC)   Acute pulmonary edema (HCC)   AKI (acute kidney injury) (HCC)   Acute on chronic hypoxemic and hypercapnic respiratory failure secondary to COPD, undiagnosed OSA, polypharmacy -Has been extubated and now on his baseline nasal cannula O2 -Had outpatient sleep study scheduled for 1/28 which was canceled due to hospitalization.  Will need to reschedule upon discharge home -Finished prednisone course for COPD   HAP -Due to worsening leukocytosis off prednisone, CXR obtained. Reviewed independently which revealed asymmetric consolidation L>R -Start cefepime/azithromax  -Watch fever, leukocytosis  -Check procalcitonin, MRSA PCR    Acute on chronic systolic heart failure -EF 40 to 45% -Central line in place to follow [place]/CVP -Heart failure team following, s/p heart cath 2/3   -Bisoprolol resumed -Holding spironolactone, lisinopril due to acute kidney injury -Continue torsemide BID  CAD -Continue BiDil  COPD exacerbation -Continue Dulera, prednisone course completed  Shock -Resolved.  Bisoprolol resumed  Acute toxic encephalopathy -Likely secondary to polypharmacy and respiratory failure -Resolved and back to baseline  Acute kidney injury on CKD stage III -Baseline creatinine 1 -Creatinine on admission 3.48 and trending downward, improved today 1.40   Paroxysmal atrial fibrillation -Resume xarelto  -Due to prolonged QTC, not a good candidate for amiodarone, Tikosyn  Hyperlipidemia -Continue Crestor  Demand ischemia -Due to acute illness  Seizure disorder -Resume Valium, watch patient's mentation closely  Peripheral neuropathy -Neurontin  DM type 2 -Lantus, SSI. Insulin dose increase today    DVT prophylaxis: Xarelto Code Status: Full code Family Communication: No family at bedside  Disposition Plan: Pending further improvement in leukocytosis, pneumonia treatment    Consultants:   Critical care  Advanced heart failure  Procedures:   Central line placed 1/29  Heart cath 2/3   Antimicrobials:  Anti-infectives (From admission, onward)   Start     Dose/Rate Route Frequency Ordered Stop   12/07/18 1200  ceFEPIme (MAXIPIME) 2 g in sodium chloride 0.9 % 100 mL IVPB     2 g 200 mL/hr over 30 Minutes Intravenous Every 12 hours 12/07/18 1158     12/07/18 1100  ceFEPIme (MAXIPIME) 1 g in sodium chloride 0.9 % 100 mL IVPB  Status:  Discontinued     1 g 200 mL/hr over 30 Minutes Intravenous Every 24 hours 12/07/18 1023 12/07/18 1158   12/07/18 1100  azithromycin (ZITHROMAX) 500 mg in sodium chloride 0.9 % 250 mL IVPB     500 mg 250 mL/hr over 60 Minutes Intravenous Every 24 hours  12/07/18 1023         Objective: Vitals:   12/07/18 0218 12/07/18 0552 12/07/18 0741 12/07/18 1202  BP:  100/63  (!) 94/50  Pulse:  (!) 101  78  Resp:  20    Temp:  99 F (37.2 C)  99.5 F (37.5 C)  TempSrc:  Oral  Oral  SpO2: 94% 100% 98% 100%  Weight:  123.5 kg    Height:        Intake/Output Summary (Last 24 hours) at 12/07/2018 1309 Last data filed at 12/07/2018 1204 Gross per 24 hour  Intake 845.13 ml  Output 1525 ml  Net -679.87 ml   Filed Weights   12/05/18 0436 12/06/18 0618 12/07/18 0552  Weight: 119.4 kg 121.1 kg 123.5 kg    Examination: General exam: Appears calm and comfortable  Respiratory system: Rhonchi, bibasilar, no respiratory distress, no conversational dyspnea on [place] O2  Cardiovascular system: S1 & S2 heard, Irreg rhythm. No JVD, murmurs, rubs, gallops or clicks. No pedal edema. Gastrointestinal system: Abdomen is nondistended, soft and nontender. No organomegaly or masses felt. Normal bowel sounds heard. Central nervous system: Alert and oriented. No focal neurological deficits. Extremities: Symmetric 5 x 5 power. Psychiatry: Judgement and insight appear normal. Mood & affect appropriate.   Data Reviewed: I have personally reviewed following labs and imaging studies  CBC: Recent Labs  Lab 12/01/18 0011  12/04/18 0630 12/05/18 0448 12/06/18 0343 12/06/18 1010 12/07/18 0500 12/07/18 0715  WBC 11.3*   < > 8.9 12.8* 13.2*  --  21.4*  21.6* DUPL SEE [id number]  NEUTROABS 9.7*  --   --   --   --   --  18.4* PENDING  HGB 9.4*   < > 9.1* 8.7* 8.2* 9.5*  9.9* 8.2*  8.2* DUPL SEE T42104  HCT 32.5*   < > 29.6* 30.1* 28.3* 28.0*  29.0* 28.4*  28.7* DUPL SEE T42104  MCV 90.5   < > 86.8 87.0 87.6  --  89.0  88.0 DUPL SEE T42104  PLT 328   < > 306 301 277  --  250  260 DUPL SEE T42104   < > = values in this interval not displayed.   Basic Metabolic Panel: Recent Labs  Lab 12/01/18 0408  12/03/18 0447 12/04/18 0630 12/05/18 0448 12/06/18 0343  12/06/18 1010 12/07/18 0500  NA 135   < > 144 143 139 142 141  140 140  K 5.4*   < > 3.8 3.5 3.5 3.5 3.2*  3.3* 3.6  CL 90*   < > 98 97* 93* 97*  --  96*  CO2 26   < > 33* 35* 33* 34*  --  33*  GLUCOSE 78   < > 162* 183* 189* 205*  --  245*  BUN 96*   < > 65* 56* 55* 52*  --  32*  CREATININE 3.15*   < > 1.27* 1.36* 1.63* 1.49*  --  1.40*  CALCIUM 8.8*   < > 8.8* 8.5* 7.9* 7.8*  --  8.0*  MG 2.9*  --   --   --   --   --   --   --  PHOS 5.5*  --   --   --   --   --   --   --    < > = values in this interval not displayed.   GFR: Estimated Creatinine Clearance: 66.7 mL/min (A) (by C-G formula based on SCr of 1.4 mg/dL (H)). Liver Function Tests: No results for input(s): AST, ALT, ALKPHOS, BILITOT, PROT, ALBUMIN in the last 168 hours. No results for input(s): LIPASE, AMYLASE in the last 168 hours. No results for input(s): AMMONIA in the last 168 hours. Coagulation Profile: No results for input(s): INR, PROTIME in the last 168 hours. Cardiac Enzymes: Recent Labs  Lab 12/01/18 0408 12/01/18 1206  TROPONINI 0.36* 0.42*   BNP (last 3 results) No results for input(s): PROBNP in the last 8760 hours. HbA1C: No results for input(s): HGBA1C in the last 72 hours. CBG: Recent Labs  Lab 12/06/18 1137 12/06/18 1627 12/06/18 2108 12/07/18 0743 12/07/18 1158  GLUCAP 163* 214* 255* 199* 224*   Lipid Profile: No results for input(s): CHOL, HDL, LDLCALC, TRIG, CHOLHDL, LDLDIRECT in the last 72 hours. Thyroid Function Tests: No results for input(s): TSH, T4TOTAL, FREET4, T3FREE, THYROIDAB in the last 72 hours. Anemia Panel: Recent Labs    12/06/18 1216  FERRITIN 58  TIBC 311  IRON 47   Sepsis Labs: Recent Labs  Lab 12/07/18 0500  PROCALCITON 0.17    Recent Results (from the past 240 hour(s))  Culture, blood (routine x 2)     Status: None   Collection Time: 12/01/18  4:00 PM  Result Value Ref Range Status   Specimen Description BLOOD RIGHT HAND  Final   Special Requests    Final    BOTTLES DRAWN AEROBIC ONLY Blood Culture adequate volume   Culture   Final    NO GROWTH 5 DAYS Performed at [facility], [place] [address], [place], [place] [address]    Report Status 12/06/2018 FINAL  Final  Culture, respiratory (non-expectorated)     Status: None   Collection Time: 12/01/18  4:03 PM  Result Value Ref Range Status   Specimen Description TRACHEAL ASPIRATE  Final   Special Requests NONE  Final   Gram Stain   Final    RARE WBC PRESENT, PREDOMINANTLY PMN RARE GRAM POSITIVE COCCI RARE GRAM POSITIVE RODS Performed at [facility], [place] [address] [place], [place] [address]    Culture FEW Consistent with normal respiratory flora.  Final   Report Status 12/04/2018 FINAL  Final  Culture, blood (routine x 2)     Status: None   Collection Time: 12/01/18  4:10 PM  Result Value Ref Range Status   Specimen Description BLOOD RIGHT HAND  Final   Special Requests   Final    BOTTLES DRAWN AEROBIC ONLY Blood Culture results may not be optimal due to an inadequate volume of blood received in culture bottles   Culture   Final    NO GROWTH 5 DAYS Performed at [facility], [place] [address], [place], [place] [address]    Report Status 12/06/2018 FINAL  Final       Radiology Studies: Dg Chest 2 View  Result Date: 12/07/2018 CLINICAL DATA:  Fever, cough, shortness of breath EXAM: CHEST - 2 VIEW COMPARISON:  12/01/2018 FINDINGS: Right internal jugular central line tip is in the right innominate vein, retracted since prior study. Cardiomegaly. Bilateral interstitial prominence with perihilar and lower lobe opacities on the right and diffuse airspace opacity on the left.  Small bilateral pleural effusions, left greater than right. IMPRESSION: Retraction of the right PICC line with the tip now in the right innominate vein. Cardiomegaly. Bilateral asymmetric airspace disease, left greater than right could reflect asymmetric edema or infection. Small effusions.  Electronically Signed   By: Rolm Baptise M.D.   On: 12/07/2018 10:00      Scheduled Meds: . bisoprolol  5 mg Oral Daily  . diazepam  10 mg Oral Daily  . gabapentin  300 mg Oral BID  . insulin aspart  0-15 Units Subcutaneous TID WC  . insulin aspart  5 Units Subcutaneous TID WC  . [START ON 12/08/2018] insulin glargine  15 Units Subcutaneous Daily  . ipratropium-albuterol  3 mL Nebulization Q6H  . isosorbide-hydrALAZINE  1 tablet Oral TID  . mouth rinse  15 mL Mouth Rinse BID  . mometasone-formoterol  2 puff Inhalation BID  . pantoprazole  40 mg Oral QHS  . rivaroxaban  20 mg Oral Q supper  . rosuvastatin  20 mg Oral Daily  . sodium chloride flush  3 mL Intravenous Q12H  . torsemide  60 mg Oral BID   Continuous Infusions: . sodium chloride 75 mL/hr at 12/06/18 0535  . sodium chloride    . azithromycin 500 mg (12/07/18 1221)  . ceFEPime (MAXIPIME) IV       LOS: 6 days    Time spent: 25 minutes   Dessa Phi, DO [facility] [email or web address] 12/07/2018, 1:09 PM

## 2018-12-07 NOTE — Progress Notes (Signed)
PHARMACY NOTE:  ANTIMICROBIAL RENAL DOSAGE ADJUSTMENT  Current antimicrobial regimen includes a mismatch between antimicrobial dosage and estimated renal function.  As per policy approved by the Pharmacy & Therapeutics and Medical Executive Committees, the antimicrobial dosage will be adjusted accordingly.  Current antimicrobial dosage:  Cefepime 1g IV q24h  Indication: HCAP  Renal Function:  Estimated Creatinine Clearance: 66.7 mL/min (A) (by C-G formula based on SCr of 1.4 mg/dL (H)). _0      On intermittent HD, scheduled: _1      On CRRT    Antimicrobial dosage has been changed to:  Cefepime 2g IV q12h   Thank you for allowing pharmacy to be a part of this patient's care.  Arrie Senate, PharmD, BCPS Clinical Pharmacist [phone] Please check AMION for all [place] numbers 12/07/2018

## 2018-12-07 NOTE — Progress Notes (Signed)
Patient ID: Nicholas Caldwell, male   DOB: Nov 01, 1946, 73 y.o.   MRN: [id number]     Advanced Heart Failure Rounding Note  PCP-Cardiologist: No primary care provider on file.   Subjective:    Patient denies dyspnea at rest today but some dyspnea walking.  Hemoglobin down to 8.2 and stable, no overt bleeding.  Creatinine down to 1.4. He remains in atrial fibrillation with controlled rate.    RHC/LHC (2/3):  Coronary Findings   Diagnostic  Dominance: Co-dominant  Left Main  No significant disease.  Left Anterior Descending  50-60% distal LAD stenosis.  Ramus Intermedius  Moderate vessel, no significant disease.  Left Circumflex  50% proximal LCx stenosis. Large vessel, co-dominant.  Right Coronary Artery  Relatively small RCA. 50% proximal stenosis.  Intervention   No interventions have been documented.  Right Heart   Right Heart Pressures RHC Procedural Findings: Hemodynamics (mmHg) RA mean 10 RV 56/9 PA 52/16, mean 30 PCWP mean 10 LV 95/10 AO 94/48  Oxygen saturations: PA 65% AO 96%  Cardiac Output (Fick) 9.2  Cardiac Index (Fick) 3.85 PVR 2.2 WU     Objective:   Weight Range: 123.5 kg Body mass index is 36.93 kg/m.   Vital Signs:   Temp:  [98.7 F (37.1 C)-99 F (37.2 C)] 99 F (37.2 C) (02/04 0552) Pulse Rate:  [0-295] 101 (02/04 0552) Resp:  [9-29] 20 (02/04 0552) BP: (100-137)/(47-72) 100/63 (02/04 0552) SpO2:  [0 %-100 %] 98 % (02/04 0741) Weight:  [123.5 kg] 123.5 kg (02/04 0552) Last BM Date: 12/06/18  Weight change: Filed Weights   12/05/18 0436 12/06/18 0618 12/07/18 0552  Weight: 119.4 kg 121.1 kg 123.5 kg   Intake/Output:   Intake/Output Summary (Last 24 hours) at 12/07/2018 0831 Last data filed at 12/07/2018 0200 Gross per 24 hour  Intake 1859.42 ml  Output 1375 ml  Net 484.42 ml    Physical Exam   General: NAD Neck: JVP 8 cm, no thyromegaly or thyroid nodule.  Lungs: Rhonchi bilaterally CV: Nondisplaced PMI.  Heart irregular  S1/S2, no S3/S4, no murmur.  No peripheral edema.   Abdomen: Soft, nontender, no hepatosplenomegaly, no distention.  Skin: Intact without lesions or rashes.  Neurologic: Alert and oriented x 3.  Psych: Normal affect. Extremities: No clubbing or cyanosis.  HEENT: Normal.   Telemetry   Atrial fibrillation with controlled VR in 80-90s, personally reviewed.   EKG   No new tracings.    Labs    CBC Recent Labs    12/06/18 0343 12/06/18 1010 12/07/18 0500  WBC 13.2*  --  21.6*  HGB 8.2* 9.5*  9.9* 8.2*  HCT 28.3* 28.0*  29.0* 28.7*  MCV 87.6  --  88.0  PLT 277  --  324   Basic Metabolic Panel Recent Labs    12/06/18 0343 12/06/18 1010 12/07/18 0500  NA 142 141  140 140  K 3.5 3.2*  3.3* 3.6  CL 97*  --  96*  CO2 34*  --  33*  GLUCOSE 205*  --  245*  BUN 52*  --  32*  CREATININE 1.49*  --  1.40*  CALCIUM 7.8*  --  8.0*   Liver Function Tests No results for input(s): AST, ALT, ALKPHOS, BILITOT, PROT, ALBUMIN in the last 72 hours. No results for input(s): LIPASE, AMYLASE in the last 72 hours. Cardiac Enzymes No results for input(s): CKTOTAL, CKMB, CKMBINDEX, TROPONINI in the last 72 hours.  BNP: BNP (last 3 results) Recent Labs  11/16/18 0237 11/21/18 2238 12/01/18 0015  BNP 606.1* 1,739.7* 1,666.7*    ProBNP (last 3 results) No results for input(s): PROBNP in the last 8760 hours.   D-Dimer No results for input(s): DDIMER in the last 72 hours. Hemoglobin A1C No results for input(s): HGBA1C in the last 72 hours. Fasting Lipid Panel No results for input(s): CHOL, HDL, LDLCALC, TRIG, CHOLHDL, LDLDIRECT in the last 72 hours. Thyroid Function Tests No results for input(s): TSH, T4TOTAL, T3FREE, THYROIDAB in the last 72 hours.  Invalid input(s): FREET3  Other results:   Imaging    No results found.   Medications:     Scheduled Medications: . bisoprolol  5 mg Oral Daily  . diazepam  10 mg Oral Daily  . gabapentin  300 mg Oral BID  .  insulin aspart  0-15 Units Subcutaneous TID WC  . insulin aspart  5 Units Subcutaneous TID WC  . insulin glargine  10 Units Subcutaneous Daily  . ipratropium-albuterol  3 mL Nebulization Q6H  . isosorbide-hydrALAZINE  1 tablet Oral TID  . mouth rinse  15 mL Mouth Rinse BID  . mometasone-formoterol  2 puff Inhalation BID  . pantoprazole  40 mg Oral QHS  . rivaroxaban  20 mg Oral Q supper  . rosuvastatin  20 mg Oral Daily  . sodium chloride flush  3 mL Intravenous Q12H    Infusions: . sodium chloride 75 mL/hr at 12/06/18 0535  . sodium chloride      PRN Medications: sodium chloride, acetaminophen, albuterol, docusate sodium, guaiFENesin-dextromethorphan, ondansetron (ZOFRAN) IV, oxyCODONE-acetaminophen, sodium chloride flush, sodium chloride flush    Patient Profile   Terrick Allred is a 73 y.o. male with PMH of chronic systolic CHF/nonischemic cardiomyopathy Echo 12/13/2016 LVEF 40-45%, COPD on home oxygen, PAFs/p DCCV 2018,and history of non compliance.  Admitted overnight with acute respiratory failure, acute renal failure, and hypotension.   Assessment/Plan   1. Acute on chronic hypercarbic/hypoxemic respiratory failure: He is on home oxygen at baseline with COPD.  Patient was intubated at admission. Likely multifactorial.  He initially appeared volume overloaded on exam with pulmonary edema on CXR. Also concern for oversedation from home pain meds and COPD.  RHC yesterday showed filling pressures fairly well optimized.  Today, significant rhonchi on lung exam and WBCs higher despite being off prednisone.  - Would get CXR PA/lateral today.  - Will need eventual sleep study. Was scheduled for 11/30/2018, will need to reschedule. - Restart torsemide 60 mg bid for home.  2. Acute on chronic systolic CHF: Echo in 0/24 with EF 40-45%, mild RV dilation.  Nonischemic cardiomyopathy.  He was admitted with hypotension and was initially on norepinephrine.  Pulmonary edema on initial CXR  and volume overloaded on exam.  Also with AKI at admission. Echo 12/02/2018 LVEF stable at 40-45%. RHC/LHC 2/3 with nonobstructive CAD, filling pressures near-optimized. Good cardiac output.  Though he has rhonchi on lung exam, volume status appears ok.  - Restart torsemide at 60 mg po bid.   - Holding lisinopril and spironolactone with AKI.  - Continue Bidil 1 tab tid - Continue bisoprolol  5 mg daily.  3. AKI likely due to ATN: Baseline creatinine 1.0. Creatinine 3.48 on 12/01/18.  Creatinine down to 1.4 today.    - Holding lisinopril and spironolactone with AKI, - Follow creatinine with restarting diuretics today.  4. Shock/hypotension: Presented with low BP, initially required norepinephrine. Unclear at this time what caused his presentation, may have been over-sedation with home pain meds. Good  cardiac output on RHC.  5. COPD: He is now off steroids.  Diffuse rhonchi on lung exam.  - CXR today.  - Further treatment per primary service.  6. Atrial flutter/fibrillation: Recurrent. Likely in setting of acute illness. Now rate controlled.  He has previously failed amiodarone due to markedly prolonged QT. Not tikosyn candidate for same reason.  - Continue Xarelto.   - Continue current bisoprolol.  - I think he will need eventual cardioversion.  If he remains reasonably well-compensated, can wait for 3 wks of uninterrupted Xarelto and do DCCV.  Alternatively, could plan TEE-guided DCCV earlier than that.  7. Elevated troponin: Mild troponin elevation with no trend. Likely demand ischemia with volume overload/hypotension.  Coronary angiography showed nonobstructive CAD.  - Continue statin.   8. Anemia: Slow down-trend in hemoglobin, 8.2 today.  No overt bleeding. Transferrin saturation 15%.  - Guaiac stool.  - Will give IV Fe.  - Watch CBC closely with Xarelto use.   Length of Stay: 6  Loralie Champagne, MD  12/07/2018, 8:31 AM  Advanced Heart Failure Team Pager [phone] (M-F; 7a - 4p)  Please  contact [facility] for night-coverage after hours (4p -7a ) and weekends on [email or web address]

## 2018-12-07 NOTE — Progress Notes (Signed)
Inpatient Diabetes Program Recommendations  AACE/ADA: New Consensus Statement on Inpatient Glycemic Control (2015)  Target Ranges:  Prepandial:   less than 140 mg/dL      Peak postprandial:   less than 180 mg/dL (1-2 hours)      Critically ill patients:  140 - 180 mg/dL   Lab Results  Component Value Date   GLUCAP 199 (H) 12/07/2018   HGBA1C 6.5 (H) 11/13/2018    Review of Glycemic Control  Diabetes history: DM2 Outpatient Diabetes medications: 70/30 30 units bid, metformin 1000 mg bid Current orders for Inpatient glycemic control: Lantus 10 units QD, Novolog 5 units tidwc, Novolog 0-15 units tidwc  Inpatient Diabetes Program Recommendations:     Increase Lantus to 15 units QD Add CHO mod med to 2 gram sodium diet.   Thank you. Lorenda Peck, RD, LDN, CDE Inpatient Diabetes Coordinator [phone]

## 2018-12-07 NOTE — Plan of Care (Signed)
  Problem: Education: Goal: Knowledge of General Education information will improve Description Including pain rating scale, medication(s)/side effects and non-pharmacologic comfort measures Outcome: Progressing   Problem: Health Behavior/Discharge Planning: Goal: Ability to manage health-related needs will improve Outcome: Progressing   Problem: Clinical Measurements: Goal: Ability to maintain clinical measurements within normal limits will improve Outcome: Progressing Goal: Respiratory complications will improve Outcome: Progressing Goal: Cardiovascular complication will be avoided Outcome: Progressing   Problem: Activity: Goal: Risk for activity intolerance will decrease Outcome: Progressing   Problem: Nutrition: Goal: Adequate nutrition will be maintained Outcome: Progressing   Problem: Coping: Goal: Level of anxiety will decrease Outcome: Progressing   Problem: Elimination: Goal: Will not experience complications related to bowel motility Outcome: Progressing Goal: Will not experience complications related to urinary retention Outcome: Progressing   Problem: Pain Managment: Goal: General experience of comfort will improve Outcome: Progressing   Problem: Safety: Goal: Ability to remain free from injury will improve Outcome: Progressing   Problem: Skin Integrity: Goal: Risk for impaired skin integrity will decrease Outcome: Progressing   Problem: Education: Goal: Ability to demonstrate management of disease process will improve Outcome: Progressing Goal: Ability to verbalize understanding of medication therapies will improve Outcome: Progressing   Problem: Activity: Goal: Capacity to carry out activities will improve Outcome: Progressing   Problem: Cardiac: Goal: Ability to achieve and maintain adequate cardiopulmonary perfusion will improve Outcome: Progressing

## 2018-12-07 NOTE — Plan of Care (Signed)
Continue plan of care. Pt on IV antibiotics for lung infection and elevated WBCs.

## 2018-12-07 NOTE — Progress Notes (Signed)
Occupational Therapy Treatment Patient Details Name: Nicholas Caldwell MRN: [id number] DOB: Feb 29, 1946 Today's Date: 12/07/2018    History of present illness 37M who initially presented with hypotension and lethargy and found with acute renal failure with hyperkalemia. Respiratory status worsened. Started on BiPAP however mental status poor and subsequently intubated for acute hypercarbic and hypoxemic respiratory failure PMH includes but not limited to: COPD, CHF, CAD, A flutter, PAF, cardioversion 9/19.    OT comments  PATIENT WAS COOPERATIVE AND MOTIVATED TO WORK WITH OT. PATIENT WAS ABLE TO AMB IN ROOM AT MIN GUARD ASSIST LEVEL. PATIENT WAS ABLE TO PERFORM TRANSFER TO COMMODE AT MIN GUARD ASSIST LEVEL. PATIENT PERFORMED GROOMING AT [facility] AT S LEVEL. PATIENT WAS EDUCATED ON USE OF AE FOR LE ADLS AND WAS ABLE TO RETURN DEMO AT MIN A LEVEL.   Follow Up Recommendations       Equipment Recommendations       Recommendations for Other Services      Precautions / Restrictions Precautions Precautions: Fall Precaution Comments: monitor SPO2 Restrictions Weight Bearing Restrictions: No       Mobility Bed Mobility         Supine to sit: Modified independent (Device/Increase time)        Transfers       Sit to Stand: Min guard              Balance                                           ADL either performed or assessed with clinical judgement   ADL                   Upper Body Dressing : Set up   Lower Body Dressing: Minimal assistance;With adaptive equipment;Sit to/from stand   Toilet Transfer: Min guard;Ambulation;Comfort height toilet;Grab bars;RW           Functional mobility during ADLs: Min guard(WITH AMB WITH WALKER. ) General ADL Comments: PATIENT EDUCATED ON USE OF  AE FOR LE ADLS. PATIENT WAS ABLE TO RETURN DEMO WITH MIN ASSSIST.      Vision       Perception     Praxis      Cognition Arousal/Alertness:  Awake/alert Behavior During Therapy: WFL for tasks assessed/performed Overall Cognitive Status: Difficult to assess                                          Exercises     Shoulder Instructions       General Comments      Pertinent Vitals/ Pain       Pain Assessment: 0-10 Pain Score: 8  Pain Location: back and r hip Pain Intervention(s): Patient requesting pain meds-RN notified  Home Living                                          Prior Functioning/Environment              Frequency           Progress Toward Goals  OT Goals(current goals can now be found in the care plan section)  Progress towards OT goals:  Progressing toward goals  Acute Rehab OT Goals Patient Stated Goal: TO GET BETTER  Plan Discharge plan remains appropriate    Co-evaluation                 AM-PAC OT "6 Clicks" Daily Activity     Outcome Measure   Help from another person eating meals?: None Help from another person taking care of personal grooming?: A Little Help from another person toileting, which includes using toliet, bedpan, or urinal?: A Little Help from another person bathing (including washing, rinsing, drying)?: A Lot Help from another person to put on and taking off regular upper body clothing?: A Little Help from another person to put on and taking off regular lower body clothing?: A Little 6 Click Score: 18    End of Session Equipment Utilized During Treatment: Rolling walker;Oxygen      Activity Tolerance Patient tolerated treatment well   Patient Left in chair;with call bell/phone within reach   Nurse Communication (OK THERAPY)        Time: 1012-1050 OT Time Calculation (min): 38 min  Charges: OT General Charges $OT Visit: 1 Visit OT Treatments $Self Care/Home Management : 94-70 mins  6 CLICKS   Suha Schoenbeck 12/07/2018, 11:59 AM

## 2018-12-07 NOTE — Care Management Important Message (Signed)
Important Message  Patient Details  Name: Nicholas Caldwell MRN: [id number] Date of Birth: 04-18-1946   Medicare Important Message Given:  Yes    [name] 12/07/2018, 5:08 PM

## 2018-12-08 LAB — BASIC METABOLIC PANEL
Anion gap: 11 (ref 5–15)
BUN: 30 mg/dL — ABNORMAL HIGH (ref 8–23)
CO2: 32 mmol/L (ref 22–32)
Calcium: 8 mg/dL — ABNORMAL LOW (ref 8.9–10.3)
Chloride: 97 mmol/L — ABNORMAL LOW (ref 98–111)
Creatinine, Ser: 1.39 mg/dL — ABNORMAL HIGH (ref 0.61–1.24)
GFR calc Af Amer: 59 mL/min — ABNORMAL LOW (ref >60)
GFR, EST NON AFRICAN AMERICAN: 51 mL/min — AB (ref >60)
Glucose, Bld: 183 mg/dL — ABNORMAL HIGH (ref 70–99)
Potassium: 3.6 mmol/L (ref 3.5–5.1)
Sodium: 140 mmol/L (ref 135–145)

## 2018-12-08 LAB — GLUCOSE, CAPILLARY
GLUCOSE-CAPILLARY: 107 mg/dL — AB (ref 70–99)
Glucose-Capillary: 173 mg/dL — ABNORMAL HIGH (ref 70–99)
Glucose-Capillary: 163 mg/dL — ABNORMAL HIGH (ref 70–99)
Glucose-Capillary: 241 mg/dL — ABNORMAL HIGH (ref 70–99)
Glucose-Capillary: 185 mg/dL — ABNORMAL HIGH (ref 70–99)

## 2018-12-08 LAB — CBC
HCT: 26 % — ABNORMAL LOW (ref 39.0–52.0)
Hemoglobin: 7.8 g/dL — ABNORMAL LOW (ref 13.0–17.0)
MCH: 26.4 pg (ref 26.0–34.0)
MCHC: 30 g/dL (ref 30.0–36.0)
MCV: 87.8 fL (ref 80.0–100.0)
Platelets: 237 10*3/uL (ref 150–400)
RBC: 2.96 MIL/uL — ABNORMAL LOW (ref 4.22–5.81)
RDW: 18 % — ABNORMAL HIGH (ref 11.5–15.5)
WBC: 21.7 10*3/uL — ABNORMAL HIGH (ref 4.0–10.5)
nRBC: 0.1 % (ref 0.0–0.2)

## 2018-12-08 LAB — STREP PNEUMONIAE URINARY ANTIGEN: Strep Pneumo Urinary Antigen: NEGATIVE

## 2018-12-08 LAB — COOXEMETRY PANEL
CARBOXYHEMOGLOBIN: 1.6 % — AB (ref 0.5–1.5)
METHEMOGLOBIN: 2 % — AB (ref 0.0–1.5)
O2 Saturation: 90.6 %
Total hemoglobin: 8 g/dL — ABNORMAL LOW (ref 12.0–16.0)

## 2018-12-08 LAB — PROCALCITONIN: PROCALCITONIN: 0.22 ng/mL

## 2018-12-08 MED ORDER — POTASSIUM CHLORIDE CRYS ER 20 MEQ PO TBCR
40.0000 meq | EXTENDED_RELEASE_TABLET | Freq: Once | ORAL | Status: AC
  Administered 2018-12-08: 40 meq via ORAL
  Filled 2018-12-08: qty 2

## 2018-12-08 MED ORDER — FUROSEMIDE 10 MG/ML IJ SOLN
80.0000 mg | Freq: Two times a day (BID) | INTRAMUSCULAR | Status: DC
  Administered 2018-12-08 – 2018-12-09 (×4): 80 mg via INTRAVENOUS
  Filled 2018-12-08 (×4): qty 8

## 2018-12-08 MED ORDER — OXYCODONE-ACETAMINOPHEN 5-325 MG PO TABS
1.0000 | ORAL_TABLET | Freq: Four times a day (QID) | ORAL | Status: DC | PRN
  Administered 2018-12-08 – 2018-12-12 (×14): 2 via ORAL
  Administered 2018-12-12 (×2): 1 via ORAL
  Filled 2018-12-08 (×6): qty 2
  Filled 2018-12-08: qty 1
  Filled 2018-12-08 (×4): qty 2
  Filled 2018-12-08: qty 1
  Filled 2018-12-08 (×3): qty 2
  Filled 2018-12-08 (×2): qty 1

## 2018-12-08 MED ORDER — TORSEMIDE 20 MG PO TABS: 80.0000 mg | ORAL_TABLET | Freq: Two times a day (BID) | ORAL | Status: DC

## 2018-12-08 MED ORDER — METOLAZONE 2.5 MG PO TABS
2.5000 mg | ORAL_TABLET | Freq: Once | ORAL | Status: AC
  Administered 2018-12-08: 2.5 mg via ORAL
  Filled 2018-12-08: qty 1

## 2018-12-08 MED ORDER — AZITHROMYCIN 500 MG PO TABS
500.0000 mg | ORAL_TABLET | Freq: Every day | ORAL | Status: DC
  Administered 2018-12-09 – 2018-12-12 (×4): 500 mg via ORAL
  Filled 2018-12-08 (×4): qty 1

## 2018-12-08 NOTE — Progress Notes (Signed)
Pt Hg is 7.8 from 8.2. MD notified

## 2018-12-08 NOTE — Progress Notes (Signed)
Pt had a 7 beat run of vtach. MD notified

## 2018-12-08 NOTE — Progress Notes (Signed)
Central line removed per orders. Site clean dry, intact. Vaseline gauze and gauze dressing applied. Patient aware to be on bedrest for 30 minutes until 1045 and to not get dressing wet and leave intact for 24 hours.

## 2018-12-08 NOTE — Progress Notes (Signed)
Patient states he takes his prn pain medications every 6 hours as needed at home. Dr. Wynelle Cleveland paged and made aware of patient's request.

## 2018-12-08 NOTE — Progress Notes (Signed)
Clarified with Caryl Pina, NP with heart failure team, it is okay to discontinue central line.

## 2018-12-08 NOTE — Progress Notes (Addendum)
Patient ID: Nicholas Caldwell, male   DOB: 15-May-1946, 73 y.o.   MRN: [id number]     Advanced Heart Failure Rounding Note  PCP-Cardiologist: No primary care provider on file.   Subjective:    Denies SOB at rest, but some with walking halls. Worked with PT and OT. Walked using a rolling walker with minimal guard, but required multiple rest breaks. Recommending HHPT. Remains in Afib/Flutter  Hgb 8.2 -> 7.8. Pt reports dark stools on Monday, 12/06/2018 the last time he had a bowel movement.   I/O + 400 cc. Weight shows up 3 lbs. ? Accuracy. Weight 275. Baseline runs 268-274 PTA.   RHC/LHC (2/3):  Coronary Findings   Diagnostic  Dominance: Co-dominant  Left Main  No significant disease.  Left Anterior Descending  50-60% distal LAD stenosis.  Ramus Intermedius  Moderate vessel, no significant disease.  Left Circumflex  50% proximal LCx stenosis. Large vessel, co-dominant.  Right Coronary Artery  Relatively small RCA. 50% proximal stenosis.  Intervention   No interventions have been documented.  Right Heart   Right Heart Pressures RHC Procedural Findings: Hemodynamics (mmHg) RA mean 10 RV 56/9 PA 52/16, mean 30 PCWP mean 10 LV 95/10 AO 94/48  Oxygen saturations: PA 65% AO 96%  Cardiac Output (Fick) 9.2  Cardiac Index (Fick) 3.85 PVR 2.2 WU     Objective:   Weight Range: 124.7 kg Body mass index is 37.3 kg/m.   Vital Signs:   Temp:  [98.7 F (37.1 C)-99.5 F (37.5 C)] 98.7 F (37.1 C) (02/05 0623) Pulse Rate:  [66-105] 75 (02/05 0623) Resp:  [16-20] 20 (02/05 0001) BP: (94-108)/(50-65) 108/65 (02/05 0623) SpO2:  [95 %-100 %] 99 % (02/05 0737) Weight:  [124.7 kg] 124.7 kg (02/05 0623) Last BM Date: 12/06/18  Weight change: Filed Weights   12/06/18 0618 12/07/18 0552 12/08/18 1443  Weight: 121.1 kg 123.5 kg 124.7 kg   Intake/Output:   Intake/Output Summary (Last 24 hours) at 12/08/2018 0807 Last data filed at 12/08/2018 0801 Gross per 24 hour  Intake  2225.06 ml  Output 1275 ml  Net 950.06 ml    Physical Exam   General: NAD HEENT: Normal Neck: Supple. JVP 8-9 cm at least.  Carotids 2+ bilat; no bruits. No thyromegaly or nodule noted. Cor: PMI nondisplaced. RRR, No M/G/R noted Lungs: CTAB, normal effort. Abdomen: Soft, non-tender, non-distended, no HSM. No bruits or masses. +BS  Extremities: No cyanosis, clubbing, or rash. R and LLE no edema.  Neuro: Alert & orientedx3, cranial nerves grossly intact. moves all 4 extremities w/o difficulty. Affect pleasant    Telemetry   Afib/flutter rate controlled 70-80s, personally reviewed.   EKG   No new tracings.    Labs    CBC Recent Labs    12/07/18 0500 12/07/18 0715 12/08/18 0310  WBC 21.4*  21.6* DUPL SEE T42104 21.7*  NEUTROABS 18.4* PENDING  --   HGB 8.2*  8.2* DUPL SEE T42104 7.8*  HCT 28.4*  28.7* DUPL SEE T42104 26.0*  MCV 89.0  88.0 DUPL SEE T42104 87.8  PLT 250  260 DUPL SEE T42104 154   Basic Metabolic Panel Recent Labs    12/07/18 0500 12/08/18 0310  NA 140 140  K 3.6 3.6  CL 96* 97*  CO2 33* 32  GLUCOSE 245* 183*  BUN 32* 30*  CREATININE 1.40* 1.39*  CALCIUM 8.0* 8.0*   Liver Function Tests No results for input(s): AST, ALT, ALKPHOS, BILITOT, PROT, ALBUMIN in the last 72 hours. No results  for input(s): LIPASE, AMYLASE in the last 72 hours. Cardiac Enzymes No results for input(s): CKTOTAL, CKMB, CKMBINDEX, TROPONINI in the last 72 hours.  BNP: BNP (last 3 results) Recent Labs    11/16/18 0237 11/21/18 2238 12/01/18 0015  BNP 606.1* 1,739.7* 1,666.7*    ProBNP (last 3 results) No results for input(s): PROBNP in the last 8760 hours.   D-Dimer No results for input(s): DDIMER in the last 72 hours. Hemoglobin A1C No results for input(s): HGBA1C in the last 72 hours. Fasting Lipid Panel No results for input(s): CHOL, HDL, LDLCALC, TRIG, CHOLHDL, LDLDIRECT in the last 72 hours. Thyroid Function Tests No results for input(s): TSH,  T4TOTAL, T3FREE, THYROIDAB in the last 72 hours.  Invalid input(s): FREET3  Other results:   Imaging    Dg Chest 2 View  Result Date: 12/07/2018 CLINICAL DATA:  Fever, cough, shortness of breath EXAM: CHEST - 2 VIEW COMPARISON:  12/01/2018 FINDINGS: Right internal jugular central line tip is in the right innominate vein, retracted since prior study. Cardiomegaly. Bilateral interstitial prominence with perihilar and lower lobe opacities on the right and diffuse airspace opacity on the left. Small bilateral pleural effusions, left greater than right. IMPRESSION: Retraction of the right PICC line with the tip now in the right innominate vein. Cardiomegaly. Bilateral asymmetric airspace disease, left greater than right could reflect asymmetric edema or infection. Small effusions. Electronically Signed   By: Rolm Baptise M.D.   On: 12/07/2018 10:00     Medications:     Scheduled Medications: . bisoprolol  5 mg Oral Daily  . diazepam  10 mg Oral Daily  . gabapentin  300 mg Oral BID  . insulin aspart  0-15 Units Subcutaneous TID WC  . insulin aspart  5 Units Subcutaneous TID WC  . insulin glargine  15 Units Subcutaneous Daily  . ipratropium-albuterol  3 mL Nebulization TID  . isosorbide-hydrALAZINE  1 tablet Oral TID  . mouth rinse  15 mL Mouth Rinse BID  . mometasone-formoterol  2 puff Inhalation BID  . pantoprazole  40 mg Oral QHS  . rivaroxaban  20 mg Oral Q supper  . rosuvastatin  20 mg Oral Daily  . sodium chloride flush  3 mL Intravenous Q12H  . torsemide  60 mg Oral BID    Infusions: . sodium chloride 75 mL/hr at 12/06/18 0535  . sodium chloride Stopped (12/08/18 0425)  . azithromycin Stopped (12/07/18 1324)  . ceFEPime (MAXIPIME) IV Stopped (12/07/18 2140)    PRN Medications: sodium chloride, acetaminophen, albuterol, docusate sodium, guaiFENesin-dextromethorphan, ondansetron (ZOFRAN) IV, oxyCODONE-acetaminophen, sodium chloride flush, sodium chloride flush    Patient  Profile   Nicholas Caldwell is a 73 y.o. male with PMH of chronic systolic CHF/nonischemic cardiomyopathy Echo 12/13/2016 LVEF 40-45%, COPD on home oxygen, PAFs/p DCCV 2018,and history of non compliance.  Admitted overnight with acute respiratory failure, acute renal failure, and hypotension.   Assessment/Plan   1. Acute on chronic hypercarbic/hypoxemic respiratory failure: He is on home oxygen at baseline with COPD.  Patient was intubated at admission. Likely multifactorial.  He initially appeared volume overloaded on exam with pulmonary edema on CXR. Also concern for oversedation from home pain meds and COPD.  RHC yesterday showed filling pressures fairly well optimized.  Today, significant rhonchi on lung exam and WBCs higher despite being off prednisone.  - CXR 12/07/18 showed L>R airspace disease; Edema vs infection.   - Will need eventual sleep study. Was scheduled for 11/30/2018, will need to reschedule. - Continue  torsemide 60 mg BID 2. Acute on chronic systolic CHF: Echo in 5/88 with EF 40-45%, mild RV dilation.  Nonischemic cardiomyopathy.  He was admitted with hypotension and was initially on norepinephrine.  Pulmonary edema on initial CXR and volume overloaded on exam.  Also with AKI at admission. Echo 12/02/2018 LVEF stable at 40-45%. RHC/LHC 2/3 with nonobstructive CAD, filling pressures near-optimized. Good cardiac output.  Though he has rhonchi on lung exam, Volume status somewhat difficult, but appears OK.  - Continue torsemide 60 mg po BID for now - Holding lisinopril and spironolactone with AKI.  - Continue Bidil 1 tab tid - Continue bisoprolol  5 mg daily.  3. AKI likely due to ATN: Baseline creatinine 1.0. Creatinine 3.48 on 12/01/18.   - Holding lisinopril and spironolactone with AKI, - Cr stable back on po diuretics.  4. Shock/hypotension: Presented with low BP, initially required norepinephrine. Unclear at this time what caused his presentation, may have been over-sedation with  home pain meds. Good cardiac output on RHC.  5. COPD: He is now off steroids.  Diffuse rhonchi on lung exam.  - CXR 12/07/18 showed L>R airspace disease; Edema vs infection.   - Further treatment per primary service.  6. Atrial flutter/fibrillation: Recurrent. Likely in setting of acute illness. Now rate controlled.  He has previously failed amiodarone due to markedly prolonged QT. Not tikosyn candidate for same reason.  - Continue Xarelto.   - Continue current bisoprolol.  - Suspect he will need eventual cardioversion.  If he remains reasonably well-compensated, can wait for 3 wks of uninterrupted Xarelto and do DCCV.  Alternatively, could plan TEE-guided DCCV earlier than that.  7. Elevated troponin: Mild troponin elevation with no trend. Likely demand ischemia with volume overload/hypotension.  Coronary angiography showed nonobstructive CAD.  - No CP.  - Continue statin.   8. Anemia: Slow down-trend in hemoglobin, 7.8 today. No overt bleeding. Transferrin saturation 15%.  - Further per primary. Consider GI consultation.  - Guaiac stool.  - Feraheme given.  - Watch CBC closely with Xarelto use.    Length of Stay: [place], [place]  12/08/2018, 8:07 AM  Advanced Heart Failure Team Pager [phone] (M-F; 7a - 4p)  Please contact [facility] for night-coverage after hours (4p -7a ) and weekends on [email or web address]   Patient seen with PA, agree with the above note with the following changes.   On my exam, he appears volume overloaded with JVP 12 cm range and weight is up. Creatinine lower today at 1.39.   I think he needs more aggressive diuresis.  - Stop torsemide, start Lasix 80 mg IV bid and will give a dose of metolazone today.  Replace K.   He was started on cefepime/azithromycin for possible PNA by hospitalist.  PCT 0.22, WBCs high at 21.7 (off steroids).  No fever.   Continue Xarelto for atrial fibrillation which is persistent.  Would not cardiovert yet in case he needs GI  workup of anemia.  Can cardiovert in the future, unfortunately not candidate for Tikosyn or amiodarone.  Rate is controlled on bisoprolol.   Loralie Champagne 12/08/2018 1:01 PM

## 2018-12-08 NOTE — Progress Notes (Signed)
PROGRESS NOTE    Nicholas Caldwell   SWH:[id number]  DOB: 18-Feb-1946  DOA: 11/30/2018 PCP: [facility]   Brief Narrative:  Nicholas Caldwell  is a 73 yo with hx of A fib, COPD, systolic CHF, and chronic pain reportedly took extra pain medications before going to bed. Afterward his wife was worried that he was getting too sleepy and shaking, and that his blood pressure might be too low. He was brought to the ER and intubated due to respiratory failure and airway protection. He was found to have hyperkalemia and acute renal failure. He takes valium, neurontin, and percocet at home.  He was consulted for acute on chronic systolic heart failure.  He was eventually extubated and transferred to [facility] 2/1. He underwent heart cath 2/3.    Subjective: Has a cough with yellow sputum which he noted on the day before admission.     Assessment & Plan:   Principal Problem:   Acute respiratory failure with hypoxia and hypercapnia/ VDRF - multifactorial>  COPD exacerbation, acute CHF, possibly polypharmacy and undiagnosed OSA - steroids have been weaned off - CXR 2/4 > Bilateral asymmetric airspace disease, left greater than right could reflect asymmetric edema or infection. - cont treatment for possible pneumonia with Cefepime and Azithromycin - U strep antigen ordered today is negative - for CHF > being diuresed by cardiology - O2 weaned to 2 L O2 which is his baseline  Active Problems: Nonischemic cardiomyopathy-   Essential hypertension   CAD - Non-obstructive by LHC1/16   Acute on chronic combined systolic and diastolic CHF (congestive heart failure)  - - heart failure team following and assisting with management - cont Bisoprolol, IV lasix, Zaroxolyn, Bidil  Anemia - Hb 7.8 today- has been anywhere from 8-11 in the past month - received Feraheme 2/4    AKI (acute kidney injury)   - suspected to be due to ATN-- elevated to 3.48 on 12/01/18-  baseline Cr 1.0  - cont to hold  Lisinopril and Spironolactone - has improved to 1.39    Type 2 diabetes mellitus with neuropathy - cont Lantus and Novolog    Hyperlipidemia   - cotn Zebeta    PAF (paroxysmal atrial fibrillation) - cont Xarelto and Bisoprolol     Time spent in minutes: 35 DVT prophylaxis: Xarelto Code Status: Full code Family Communication:  Disposition Plan: home when pneumonia is improving Consultants:   PCCM  Heart failure team Procedures:   Cardiac cath 2/3  Central line 1/29 Antimicrobials:  Anti-infectives (From admission, onward)   Start     Dose/Rate Route Frequency Ordered Stop   12/07/18 1200  ceFEPIme (MAXIPIME) 2 g in sodium chloride 0.9 % 100 mL IVPB     2 g 200 mL/hr over 30 Minutes Intravenous Every 12 hours 12/07/18 1158     12/07/18 1100  ceFEPIme (MAXIPIME) 1 g in sodium chloride 0.9 % 100 mL IVPB  Status:  Discontinued     1 g 200 mL/hr over 30 Minutes Intravenous Every 24 hours 12/07/18 1023 12/07/18 1158   12/07/18 1100  azithromycin (ZITHROMAX) 500 mg in sodium chloride 0.9 % 250 mL IVPB     500 mg 250 mL/hr over 60 Minutes Intravenous Every 24 hours 12/07/18 1023         Objective: Vitals:   12/08/18 0001 12/08/18 0623 12/08/18 0737 12/08/18 1415  BP: 105/61 108/65    Pulse: 66 75    Resp: 20     Temp:  98.7 F (  37.1 C)    TempSrc:  Oral    SpO2: 96% 98% 99% 93%  Weight:  124.7 kg    Height:        Intake/Output Summary (Last 24 hours) at 12/08/2018 1437 Last data filed at 12/08/2018 1200 Gross per 24 hour  Intake 1755.06 ml  Output 1225 ml  Net 530.06 ml   Filed Weights   12/06/18 0618 12/07/18 0552 12/08/18 0623  Weight: 121.1 kg 123.5 kg 124.7 kg    Examination: General exam: Appears comfortable  HEENT: PERRLA, oral mucosa moist, no sclera icterus or thrush Respiratory system: rhonchi, Respiratory effort normal. Cardiovascular system: S1 & S2 heard, RRR.   Gastrointestinal system: Abdomen soft, non-tender, nondistended. Normal bowel  sounds. Central nervous system: Alert and oriented. No focal neurological deficits. Extremities: No cyanosis, clubbing or edema Skin: No rashes or ulcers Psychiatry:  Mood & affect appropriate.     Data Reviewed: I have personally reviewed following labs and imaging studies  CBC: Recent Labs  Lab 12/05/18 0448 12/06/18 0343 12/06/18 1010 12/07/18 0500 12/07/18 0715 12/08/18 0310  WBC 12.8* 13.2*  --  21.4*  21.6* DUPL SEE T42104 21.7*  NEUTROABS  --   --   --  18.4* PENDING  --   HGB 8.7* 8.2* 9.5*  9.9* 8.2*  8.2* DUPL SEE T42104 7.8*  HCT 30.1* 28.3* 28.0*  29.0* 28.4*  28.7* DUPL SEE T42104 26.0*  MCV 87.0 87.6  --  89.0  88.0 DUPL SEE T42104 87.8  PLT 301 277  --  250  260 DUPL SEE T42104 939   Basic Metabolic Panel: Recent Labs  Lab 12/04/18 0630 12/05/18 0448 12/06/18 0343 12/06/18 1010 12/07/18 0500 12/08/18 0310  NA 143 139 142 141  140 140 140  K 3.5 3.5 3.5 3.2*  3.3* 3.6 3.6  CL 97* 93* 97*  --  96* 97*  CO2 35* 33* 34*  --  33* 32  GLUCOSE 183* 189* 205*  --  245* 183*  BUN 56* 55* 52*  --  32* 30*  CREATININE 1.36* 1.63* 1.49*  --  1.40* 1.39*  CALCIUM 8.5* 7.9* 7.8*  --  8.0* 8.0*   GFR: Estimated Creatinine Clearance: 67.4 mL/min (A) (by C-G formula based on SCr of 1.39 mg/dL (H)). Liver Function Tests: No results for input(s): AST, ALT, ALKPHOS, BILITOT, PROT, ALBUMIN in the last 168 hours. No results for input(s): LIPASE, AMYLASE in the last 168 hours. No results for input(s): AMMONIA in the last 168 hours. Coagulation Profile: No results for input(s): INR, PROTIME in the last 168 hours. Cardiac Enzymes: No results for input(s): CKTOTAL, CKMB, CKMBINDEX, TROPONINI in the last 168 hours. BNP (last 3 results) No results for input(s): PROBNP in the last 8760 hours. HbA1C: No results for input(s): HGBA1C in the last 72 hours. CBG: Recent Labs  Lab 12/07/18 1630 12/07/18 2111 12/08/18 0635 12/08/18 0848 12/08/18 1132  GLUCAP 228*  209* 173* 163* 241*   Lipid Profile: No results for input(s): CHOL, HDL, LDLCALC, TRIG, CHOLHDL, LDLDIRECT in the last 72 hours. Thyroid Function Tests: No results for input(s): TSH, T4TOTAL, FREET4, T3FREE, THYROIDAB in the last 72 hours. Anemia Panel: Recent Labs    12/06/18 1216  FERRITIN 58  TIBC 311  IRON 47   Urine analysis:    Component Value Date/Time   COLORURINE STRAW (A) 12/01/2018 0422   APPEARANCEUR CLEAR 12/01/2018 0422   LABSPEC 1.006 12/01/2018 0422   PHURINE 5.0 12/01/2018 0422   GLUCOSEU NEGATIVE  12/01/2018 0422   HGBUR MODERATE (A) 12/01/2018 0422   BILIRUBINUR NEGATIVE 12/01/2018 0422   KETONESUR NEGATIVE 12/01/2018 0422   PROTEINUR NEGATIVE 12/01/2018 0422   UROBILINOGEN 0.2 10/26/2014 2206   NITRITE NEGATIVE 12/01/2018 0422   LEUKOCYTESUR NEGATIVE 12/01/2018 0422   Sepsis Labs: _0 (procalcitonin:4,lacticidven:4) ) Recent Results (from the past 240 hour(s))  Culture, blood (routine x 2)     Status: None   Collection Time: 12/01/18  4:00 PM  Result Value Ref Range Status   Specimen Description BLOOD RIGHT HAND  Final   Special Requests   Final    BOTTLES DRAWN AEROBIC ONLY Blood Culture adequate volume   Culture   Final    NO GROWTH 5 DAYS Performed at [facility], [place] [address] [place], [place] [address]    Report Status 12/06/2018 FINAL  Final  Culture, respiratory (non-expectorated)     Status: None   Collection Time: 12/01/18  4:03 PM  Result Value Ref Range Status   Specimen Description TRACHEAL ASPIRATE  Final   Special Requests NONE  Final   Gram Stain   Final    RARE WBC PRESENT, PREDOMINANTLY PMN RARE GRAM POSITIVE COCCI RARE GRAM POSITIVE RODS Performed at [facility], [place] [address], [place], [place] [address]    Culture FEW Consistent with normal respiratory flora.  Final   Report Status 12/04/2018 FINAL  Final  Culture, blood (routine x 2)     Status: None   Collection Time: 12/01/18  4:10 PM  Result  Value Ref Range Status   Specimen Description BLOOD RIGHT HAND  Final   Special Requests   Final    BOTTLES DRAWN AEROBIC ONLY Blood Culture results may not be optimal due to an inadequate volume of blood received in culture bottles   Culture   Final    NO GROWTH 5 DAYS Performed at [facility], [place] [address], [place], [place] [address]    Report Status 12/06/2018 FINAL  Final         Radiology Studies: Dg Chest 2 View  Result Date: 12/07/2018 CLINICAL DATA:  Fever, cough, shortness of breath EXAM: CHEST - 2 VIEW COMPARISON:  12/01/2018 FINDINGS: Right internal jugular central line tip is in the right innominate vein, retracted since prior study. Cardiomegaly. Bilateral interstitial prominence with perihilar and lower lobe opacities on the right and diffuse airspace opacity on the left. Small bilateral pleural effusions, left greater than right. IMPRESSION: Retraction of the right PICC line with the tip now in the right innominate vein. Cardiomegaly. Bilateral asymmetric airspace disease, left greater than right could reflect asymmetric edema or infection. Small effusions. Electronically Signed   By: Rolm Baptise M.D.   On: 12/07/2018 10:00      Scheduled Meds: . bisoprolol  5 mg Oral Daily  . diazepam  10 mg Oral Daily  . furosemide  80 mg Intravenous BID  . gabapentin  300 mg Oral BID  . insulin aspart  0-15 Units Subcutaneous TID WC  . insulin aspart  5 Units Subcutaneous TID WC  . insulin glargine  15 Units Subcutaneous Daily  . ipratropium-albuterol  3 mL Nebulization TID  . isosorbide-hydrALAZINE  1 tablet Oral TID  . mouth rinse  15 mL Mouth Rinse BID  . mometasone-formoterol  2 puff Inhalation BID  . pantoprazole  40 mg Oral QHS  . rivaroxaban  20 mg Oral Q supper  . rosuvastatin  20 mg Oral Daily  . sodium chloride  flush  3 mL Intravenous Q12H   Continuous Infusions: . sodium chloride Stopped (12/08/18 0425)  . azithromycin 500 mg (12/08/18 1046)  . ceFEPime  (MAXIPIME) IV 2 g (12/08/18 0956)     LOS: 7 days      Debbe Odea, MD [facility] Pager: [email or web address] Password [facility] 12/08/2018, 2:37 PM

## 2018-12-08 NOTE — Plan of Care (Signed)
  Problem: Health Behavior/Discharge Planning: Goal: Ability to manage health-related needs will improve Outcome: Progressing   Problem: Clinical Measurements: Goal: Ability to maintain clinical measurements within normal limits will improve Outcome: Progressing   Problem: Nutrition: Goal: Adequate nutrition will be maintained Outcome: Progressing   Problem: Coping: Goal: Level of anxiety will decrease Outcome: Progressing   Problem: Elimination: Goal: Will not experience complications related to bowel motility Outcome: Progressing Goal: Will not experience complications related to urinary retention Outcome: Progressing   Problem: Pain Managment: Goal: General experience of comfort will improve Outcome: Progressing   Problem: Safety: Goal: Ability to remain free from injury will improve Outcome: Progressing   Problem: Skin Integrity: Goal: Risk for impaired skin integrity will decrease Outcome: Progressing   Problem: Education: Goal: Ability to demonstrate management of disease process will improve Outcome: Progressing Goal: Ability to verbalize understanding of medication therapies will improve Outcome: Progressing   Problem: Activity: Goal: Capacity to carry out activities will improve Outcome: Progressing   Problem: Cardiac: Goal: Ability to achieve and maintain adequate cardiopulmonary perfusion will improve Outcome: Progressing

## 2018-12-08 NOTE — Progress Notes (Signed)
Occupational Therapy Treatment Patient Details Name: Nicholas Caldwell MRN: [id number] DOB: 10-13-1946 Today's Date: 12/08/2018    History of present illness Pt is a 73 y.o. male admitted 11/30/18 with hypotension and lethargy; found to have AKI with hyperkalemia. ETT 1/29-1/30. Also with CHF. S/p heart cath 2/3. PMH includes COPD, CHF, CAD, aflutter, PAF, peripheral neuropathy.   OT comments  Pt completed one grooming activity standing at sink and ambulated to bathroom with min guard assist. 3 in 1 placed over toilet to heighten sitting surface. Pt managing oxygen tubing with RW. Pt reports sitting on 3 in 1 in front of sink with nursing earlier and participating in bathing and dressing.   Follow Up Recommendations  Home health OT;Supervision/Assistance - 24 hour    Equipment Recommendations  None recommended by OT    Recommendations for Other Services      Precautions / Restrictions Precautions Precautions: Fall Precaution Comments: 3-4L O2 [place] baseline       Mobility Bed Mobility               General bed mobility comments: pt received in chair, left seated in bathroom, NT aware  Transfers Overall transfer level: Needs assistance Equipment used: Rolling walker (2 wheeled) Transfers: Sit to/from Stand Sit to Stand: Min guard         General transfer comment: increased time and effort    Balance Overall balance assessment: Needs assistance   Sitting balance-Leahy Scale: Fair       Standing balance-Leahy Scale: Poor Standing balance comment: reliant on UE support                           ADL either performed or assessed with clinical judgement   ADL Overall ADL's : Needs assistance/impaired     Grooming: Oral care;Standing;Min guard Grooming Details (indicate cue type and reason): used mouthwash at sink after breathing treatment                 Toilet Transfer: Min guard;Ambulation;RW;BSC(over toilet)           Functional mobility  during ADLs: Min guard;Rolling walker General ADL Comments: pt stating he would be purchasing AE for LB ADL prior to going home, recalled use from previous OT visit     Vision       Perception     Praxis      Cognition Arousal/Alertness: Awake/alert Behavior During Therapy: Flat affect Overall Cognitive Status: Within Functional Limits for tasks assessed                                          Exercises     Shoulder Instructions       General Comments      Pertinent Vitals/ Pain       Pain Assessment: No/denies pain  Home Living                                          Prior Functioning/Environment              Frequency  Min 2X/week        Progress Toward Goals  OT Goals(current goals can now be found in the care plan section)  Progress towards OT goals: Progressing toward goals  Acute Rehab OT Goals Patient Stated Goal: return home OT Goal Formulation: With patient Time For Goal Achievement: 12/18/18 Potential to Achieve Goals: Good  Plan Discharge plan remains appropriate    Co-evaluation                 AM-PAC OT "6 Clicks" Daily Activity     Outcome Measure   Help from another person eating meals?: None Help from another person taking care of personal grooming?: A Little Help from another person toileting, which includes using toliet, bedpan, or urinal?: A Little Help from another person bathing (including washing, rinsing, drying)?: A Little Help from another person to put on and taking off regular upper body clothing?: A Little Help from another person to put on and taking off regular lower body clothing?: A Little 6 Click Score: 19    End of Session Equipment Utilized During Treatment: Gait belt;Rolling walker;Oxygen  OT Visit Diagnosis: Unsteadiness on feet (R26.81);Other abnormalities of gait and mobility (R26.89);Muscle weakness (generalized) (M62.81);Pain   Activity Tolerance Patient  tolerated treatment well   Patient Left Other (comment)(in bathroom, NT aware)   Nurse Communication          Time: 3825-0539 OT Time Calculation (min): 15 min  Charges: OT General Charges $OT Visit: 1 Visit OT Treatments $Self Care/Home Management : 8-22 mins  Nicholas Caldwell, OTR/L Acute Rehabilitation Services Pager: [phone] Office: [phone]   Nicholas Caldwell 12/08/2018, 3:27 PM

## 2018-12-09 DIAGNOSIS — I480 Paroxysmal atrial fibrillation: Secondary | ICD-10-CM

## 2018-12-09 DIAGNOSIS — Z794 Long term (current) use of insulin: Secondary | ICD-10-CM

## 2018-12-09 DIAGNOSIS — J441 Chronic obstructive pulmonary disease with (acute) exacerbation: Secondary | ICD-10-CM

## 2018-12-09 DIAGNOSIS — E1140 Type 2 diabetes mellitus with diabetic neuropathy, unspecified: Secondary | ICD-10-CM

## 2018-12-09 LAB — GLUCOSE, CAPILLARY
GLUCOSE-CAPILLARY: 160 mg/dL — AB (ref 70–99)
Glucose-Capillary: 200 mg/dL — ABNORMAL HIGH (ref 70–99)
Glucose-Capillary: 162 mg/dL — ABNORMAL HIGH (ref 70–99)
Glucose-Capillary: 197 mg/dL — ABNORMAL HIGH (ref 70–99)

## 2018-12-09 LAB — CBC
HCT: 26.2 % — ABNORMAL LOW (ref 39.0–52.0)
Hemoglobin: 7.8 g/dL — ABNORMAL LOW (ref 13.0–17.0)
MCH: 25.8 pg — ABNORMAL LOW (ref 26.0–34.0)
MCHC: 29.8 g/dL — ABNORMAL LOW (ref 30.0–36.0)
MCV: 86.8 fL (ref 80.0–100.0)
NRBC: 0 % (ref 0.0–0.2)
Platelets: 240 10*3/uL (ref 150–400)
RBC: 3.02 MIL/uL — ABNORMAL LOW (ref 4.22–5.81)
RDW: 17.8 % — ABNORMAL HIGH (ref 11.5–15.5)
WBC: 15.2 10*3/uL — AB (ref 4.0–10.5)

## 2018-12-09 LAB — BASIC METABOLIC PANEL
Anion gap: 10 (ref 5–15)
BUN: 26 mg/dL — ABNORMAL HIGH (ref 8–23)
CO2: 33 mmol/L — ABNORMAL HIGH (ref 22–32)
Calcium: 7.9 mg/dL — ABNORMAL LOW (ref 8.9–10.3)
Chloride: 96 mmol/L — ABNORMAL LOW (ref 98–111)
Creatinine, Ser: 1.46 mg/dL — ABNORMAL HIGH (ref 0.61–1.24)
GFR calc non Af Amer: 48 mL/min — ABNORMAL LOW (ref >60)
GFR, EST AFRICAN AMERICAN: 56 mL/min — AB (ref >60)
Glucose, Bld: 169 mg/dL — ABNORMAL HIGH (ref 70–99)
Potassium: 3.4 mmol/L — ABNORMAL LOW (ref 3.5–5.1)
Sodium: 139 mmol/L (ref 135–145)

## 2018-12-09 MED ORDER — POTASSIUM CHLORIDE CRYS ER 20 MEQ PO TBCR: 40.0000 meq | EXTENDED_RELEASE_TABLET | Freq: Two times a day (BID) | ORAL | Status: DC

## 2018-12-09 MED ORDER — POTASSIUM CHLORIDE CRYS ER 20 MEQ PO TBCR
40.0000 meq | EXTENDED_RELEASE_TABLET | Freq: Two times a day (BID) | ORAL | Status: DC
  Administered 2018-12-09 – 2018-12-12 (×7): 40 meq via ORAL
  Filled 2018-12-09 (×8): qty 2

## 2018-12-09 MED ORDER — METOLAZONE 2.5 MG PO TABS
2.5000 mg | ORAL_TABLET | Freq: Once | ORAL | Status: AC
  Administered 2018-12-09: 2.5 mg via ORAL
  Filled 2018-12-09: qty 1

## 2018-12-09 MED ORDER — BISACODYL 10 MG RE SUPP
10.0000 mg | Freq: Once | RECTAL | Status: AC
  Administered 2018-12-09: 10 mg via RECTAL
  Filled 2018-12-09: qty 1

## 2018-12-09 NOTE — Progress Notes (Addendum)
Patient ID: Nicholas Caldwell, male   DOB: [birth year], 73 y.o.   MRN: [id number]     Advanced Heart Failure Rounding Note  PCP-Cardiologist: No primary care provider on file.   Subjective:    Negative 1.8 L on IV lasix 80 mg BID with dose of metolazone. Down 3 lbs.   Good UOP, feels like breathing is still a little off. No SOB at rest.   Hgb 8.2 -> 7.8 -> 7.8. No overt bleeding, has had dark stools this week.    RHC/LHC (2/3):  Coronary Findings   Diagnostic  Dominance: Co-dominant  Left Main  No significant disease.  Left Anterior Descending  50-60% distal LAD stenosis.  Ramus Intermedius  Moderate vessel, no significant disease.  Left Circumflex  50% proximal LCx stenosis. Large vessel, co-dominant.  Right Coronary Artery  Relatively small RCA. 50% proximal stenosis.  Intervention   No interventions have been documented.  Right Heart   Right Heart Pressures RHC Procedural Findings: Hemodynamics (mmHg) RA mean 10 RV 56/9 PA 52/16, mean 30 PCWP mean 10 LV 95/10 AO 94/48  Oxygen saturations: PA 65% AO 96%  Cardiac Output (Fick) 9.2  Cardiac Index (Fick) 3.85 PVR 2.2 WU     Objective:   Weight Range: 123.6 kg Body mass index is 36.94 kg/m.   Vital Signs:   Temp:  [98.4 F (36.9 C)-98.7 F (37.1 C)] 98.4 F (36.9 C) (02/06 0443) Pulse Rate:  [81] 81 (02/06 0443) Resp:  [16-18] 16 (02/06 0443) BP: (103-126)/(38-65) 106/58 (02/06 0443) SpO2:  [92 %-98 %] 93 % (02/06 0716) Weight:  [123.6 kg] 123.6 kg (02/06 0443) Last BM Date: 12/08/18  Weight change: Filed Weights   12/07/18 0552 12/08/18 0623 12/09/18 0443  Weight: 123.5 kg 124.7 kg 123.6 kg   Intake/Output:   Intake/Output Summary (Last 24 hours) at 12/09/2018 0817 Last data filed at 12/09/2018 0455 Gross per 24 hour  Intake 1313 ml  Output 3700 ml  Net -2387 ml    Physical Exam   General: NAD HEENT: Normal Neck: Supple. JVP 12 cm+. Carotids 2+ bilat; no bruits. No thyromegaly or nodule  noted. Cor: PMI nondisplaced. RRR, No M/G/R noted Lungs: Rhonchi that slightly clear with cough. Basilar crackles.  Abdomen: Soft, non-tender, non-distended, no HSM. No bruits or masses. +BS  Extremities: No cyanosis, clubbing, or rash. R and LLE no edema.  Neuro: Alert & orientedx3, cranial nerves grossly intact. moves all 4 extremities w/o difficulty. Affect pleasant   Telemetry   Afib 80-90s, personally reviewed.   EKG   No new tracings.    Labs    CBC Recent Labs    12/07/18 0500 12/07/18 0715 12/08/18 0310 12/09/18 0529  WBC 21.4*  21.6* DUPL SEE T42104 21.7* 15.2*  NEUTROABS 18.4* PENDING  --   --   HGB 8.2*  8.2* DUPL SEE T42104 7.8* 7.8*  HCT 28.4*  28.7* DUPL SEE T42104 26.0* 26.2*  MCV 89.0  88.0 DUPL SEE T42104 87.8 86.8  PLT 250  260 DUPL SEE T42104 237 627   Basic Metabolic Panel Recent Labs    12/08/18 0310 12/09/18 0529  NA 140 139  K 3.6 3.4*  CL 97* 96*  CO2 32 33*  GLUCOSE 183* 169*  BUN 30* 26*  CREATININE 1.39* 1.46*  CALCIUM 8.0* 7.9*   Liver Function Tests No results for input(s): AST, ALT, ALKPHOS, BILITOT, PROT, ALBUMIN in the last 72 hours. No results for input(s): LIPASE, AMYLASE in the last 72 hours. Cardiac Enzymes  No results for input(s): CKTOTAL, CKMB, CKMBINDEX, TROPONINI in the last 72 hours.  BNP: BNP (last 3 results) Recent Labs    11/16/18 0237 11/21/18 2238 12/01/18 0015  BNP 606.1* 1,739.7* 1,666.7*    ProBNP (last 3 results) No results for input(s): PROBNP in the last 8760 hours.   D-Dimer No results for input(s): DDIMER in the last 72 hours. Hemoglobin A1C No results for input(s): HGBA1C in the last 72 hours. Fasting Lipid Panel No results for input(s): CHOL, HDL, LDLCALC, TRIG, CHOLHDL, LDLDIRECT in the last 72 hours. Thyroid Function Tests No results for input(s): TSH, T4TOTAL, T3FREE, THYROIDAB in the last 72 hours.  Invalid input(s): FREET3  Other results:   Imaging    No results  found.   Medications:     Scheduled Medications: . azithromycin  500 mg Oral Daily  . bisoprolol  5 mg Oral Daily  . diazepam  10 mg Oral Daily  . furosemide  80 mg Intravenous BID  . gabapentin  300 mg Oral BID  . insulin aspart  0-15 Units Subcutaneous TID WC  . insulin aspart  5 Units Subcutaneous TID WC  . insulin glargine  15 Units Subcutaneous Daily  . ipratropium-albuterol  3 mL Nebulization TID  . isosorbide-hydrALAZINE  1 tablet Oral TID  . mouth rinse  15 mL Mouth Rinse BID  . mometasone-formoterol  2 puff Inhalation BID  . pantoprazole  40 mg Oral QHS  . rivaroxaban  20 mg Oral Q supper  . rosuvastatin  20 mg Oral Daily  . sodium chloride flush  3 mL Intravenous Q12H    Infusions: . sodium chloride 250 mL (12/08/18 2245)  . ceFEPime (MAXIPIME) IV 200 mL/hr at 12/08/18 2245    PRN Medications: sodium chloride, acetaminophen, albuterol, docusate sodium, guaiFENesin-dextromethorphan, ondansetron (ZOFRAN) IV, oxyCODONE-acetaminophen, sodium chloride flush, sodium chloride flush    Patient Profile   Rodney Yera is a 73 y.o. male with PMH of chronic systolic CHF/nonischemic cardiomyopathy Echo 12/13/2016 LVEF 40-45%, COPD on home oxygen, PAFs/p DCCV 2018,and history of non compliance.  Admitted overnight with acute respiratory failure, acute renal failure, and hypotension.   Assessment/Plan   1. Acute on chronic hypercarbic/hypoxemic respiratory failure: He is on home oxygen at baseline with COPD.  Patient was intubated at admission. Likely multifactorial.  He initially appeared volume overloaded on exam with pulmonary edema on CXR. Also concern for oversedation from home pain meds and COPD.  RHC yesterday showed filling pressures fairly well optimized.  Today, significant rhonchi on lung exam and WBCs higher despite being off prednisone.  - CXR 12/07/18 showed L>R airspace disease; Edema vs infection.   - Will need eventual sleep study. Was scheduled for  11/30/2018, will need to reschedule. - Diuresis as below.  - Now on ABX. For ? PNA.  2. Acute on chronic systolic CHF: Echo in 6/31 with EF 40-45%, mild RV dilation.  Nonischemic cardiomyopathy.  He was admitted with hypotension and was initially on norepinephrine.  Pulmonary edema on initial CXR and volume overloaded on exam.  Also with AKI at admission. Echo 12/02/2018 LVEF stable at 40-45%. RHC/LHC 2/3 with nonobstructive CAD, filling pressures near-optimized. Good cardiac output.  Volume status improved somewhat, but remains elevated.  - Continue 80 mg IV lasix BID for at least today. Repeat metolazone dose today.  - Holding lisinopril and spironolactone with AKI.  - Continue Bidil 1 tab tid - Continue bisoprolol  5 mg daily.  3. AKI likely due to ATN: Baseline creatinine 1.0.  Creatinine 3.48 on 12/01/18.   - Holding lisinopril and spironolactone with AKI, - Cr stable on po diuretics.  4. Shock/hypotension: Presented with low BP, initially required norepinephrine. Unclear at this time what caused his presentation, may have been over-sedation with home pain meds. Good cardiac output on RHC.  5. COPD: He is now off steroids.  Diffuse rhonchi on lung exam.  - CXR 12/07/18 showed L>R airspace disease; Edema vs infection.   - Further treatment per primary service - Currently on ABX.  6. Atrial flutter/fibrillation: Recurrent. Likely in setting of acute illness. Now rate controlled.  He has previously failed amiodarone due to markedly prolonged QT. Not tikosyn candidate for same reason.  - Continue Xarelto.   - Continue current bisoprolol.  - Suspect he will need eventual cardioversion.  If he remains reasonably well-compensated, can wait for 3 wks of uninterrupted Xarelto and do DCCV.  Alternatively, could plan TEE-guided DCCV earlier than that.  7. Elevated troponin: Mild troponin elevation with no trend. Likely demand ischemia with volume overload/hypotension.  Coronary angiography showed  nonobstructive CAD.  - No CP - Continue statin.   8. Anemia: Slow down-trend in hemoglobin, 7.8 today.  No overt bleeding. Transferrin saturation 15%.  - Further per primary.  Consider GI consultation.  - Guaiac stool.  - Feraheme given.  - Watch CBC closely with Xarelto use.   9. Hypokalemia - K 3.4 this am. Will increase supp with continued diuresis.   Length of Stay: [address]  Annamaria Helling  12/09/2018, 8:17 AM  Advanced Heart Failure Team Pager [phone] (M-F; 7a - 4p)  Please contact [facility] for night-coverage after hours (4p -7a ) and weekends on [email or web address]]  Patient seen with PA, agree with the above note with the following changes.   He diuresed well yesterday, weight down 3 lbs.  Still short of breath with ambulation. JVP still 12+ range on exam.  Creatinine fairly stable at 1.46.   - Continue Lasix 80 mg IV bid today and will give a dose of metolazone 2.5 x 1 again.  - Replace K.   He was started on cefepime/azithromycin for possible PNA.  PCT 0.22, WBCs trending down to 15.2.  No fever.   Continue Xarelto for atrial fibrillation which is persistent.  Would not cardiovert yet in case he needs GI workup of anemia.  Can cardiovert in the future, unfortunately not candidate for Tikosyn or amiodarone.  Rate is controlled on bisoprolol.  Hgb stably low today at 7.8, got feraheme.   Loralie Champagne 12/09/2018 8:33 AM

## 2018-12-09 NOTE — Progress Notes (Signed)
PT Cancellation Note  Patient Details Name: Nykolas Bacallao MRN: [id number] DOB: 1946-02-03   Cancelled Treatment:    Reason Eval/Treat Not Completed: Patient declined, no reason specified. Patient reports he is not feeling well and would prefer for therapy to come back tomorrow. Will follow up as schedule allows.   Erick Blinks, SPT  Erick Blinks 12/09/2018, 4:00 PM

## 2018-12-09 NOTE — Progress Notes (Signed)
PROGRESS NOTE    Nicholas Caldwell   UUV:[id number]  DOB: 10/13/1946  DOA: 11/30/2018 PCP: [facility]   Brief Narrative:  Nicholas Caldwell  is a 73 yo with hx of A fib, COPD, systolic CHF, and chronic pain reportedly took extra pain medications before going to bed. Afterward his wife was worried that he was getting too sleepy and shaking, and that his blood pressure might be too low. He was brought to the ER and intubated due to respiratory failure and airway protection. He was found to have hyperkalemia and acute renal failure. He takes valium, neurontin, and percocet at home.  He was consulted for acute on chronic systolic heart failure.  He was eventually extubated and transferred to [facility] 2/1. He underwent heart cath 2/3.    Subjective: Cough is a little better today. Still has shortness of breath.     Assessment & Plan:   Principal Problem:   Acute respiratory failure with hypoxia and hypercapnia/ VDRF - multifactorial>  COPD exacerbation, acute CHF, possibly polypharmacy and undiagnosed OSA - steroids have been weaned off - CXR 2/4 > Bilateral asymmetric airspace disease, left greater than right could reflect asymmetric edema or infection. - started on treatment for possible pneumonia with Cefepime and Azithromycin on 2/4- leukocytosis improving- cough improving - U strep antigen is negative - for CHF > being diuresed by cardiology - O2 weaned to 2 L O2 which is his baseline  Active Problems: Nonischemic cardiomyopathy-   Essential hypertension   CAD - Non-obstructive by LHC1/16   Acute on chronic combined systolic and diastolic CHF (congestive heart failure)  - - heart failure team following and assisting with management - cont Bisoprolol, IV lasix, Zaroxolyn, Bidil  Anemia - Hb 7.8 today- has been anywhere from 8-11 in the past month - received Feraheme 2/4 - awaiting hemoccult    AKI (acute kidney injury)   - suspected to be due to ATN-- elevated to 3.48 on  12/01/18-  baseline Cr 1.0  - cont to hold Lisinopril and Spironolactone - has improved to 1.39    Type 2 diabetes mellitus with neuropathy - cont Lantus and Novolog    Hyperlipidemia   - cotn Zebeta    PAF (paroxysmal atrial fibrillation) - cont Xarelto and Bisoprolol     Time spent in minutes: 35 DVT prophylaxis: Xarelto Code Status: Full code Family Communication:  Disposition Plan: home when pneumonia is improving Consultants:   PCCM  Heart failure team Procedures:   Cardiac cath 2/3  Central line 1/29 Antimicrobials:  Anti-infectives (From admission, onward)   Start     Dose/Rate Route Frequency Ordered Stop   12/09/18 1000  azithromycin (ZITHROMAX) tablet 500 mg     500 mg Oral Daily 12/08/18 1500     12/07/18 1200  ceFEPIme (MAXIPIME) 2 g in sodium chloride 0.9 % 100 mL IVPB     2 g 200 mL/hr over 30 Minutes Intravenous Every 12 hours 12/07/18 1158     12/07/18 1100  ceFEPIme (MAXIPIME) 1 g in sodium chloride 0.9 % 100 mL IVPB  Status:  Discontinued     1 g 200 mL/hr over 30 Minutes Intravenous Every 24 hours 12/07/18 1023 12/07/18 1158   12/07/18 1100  azithromycin (ZITHROMAX) 500 mg in sodium chloride 0.9 % 250 mL IVPB  Status:  Discontinued     500 mg 250 mL/hr over 60 Minutes Intravenous Every 24 hours 12/07/18 1023 12/08/18 1500       Objective: Vitals:  12/09/18 0935 12/09/18 1013 12/09/18 1139 12/09/18 1149  BP: (!) 94/52 122/63 (!) 78/52 (!) 86/47  Pulse: 89 98 93   Resp:      Temp:   98.6 F (37 C)   TempSrc:   Oral   SpO2:  97% 99%   Weight:      Height:        Intake/Output Summary (Last 24 hours) at 12/09/2018 1318 Last data filed at 12/09/2018 1124 Gross per 24 hour  Intake 1073 ml  Output 3850 ml  Net -2777 ml   Filed Weights   12/07/18 0552 12/08/18 0623 12/09/18 0443  Weight: 123.5 kg 124.7 kg 123.6 kg    Examination: General exam: Appears comfortable  HEENT: PERRLA, oral mucosa moist, no sclera icterus or  thrush Respiratory system: rhonchi, cough Cardiovascular system: S1 & S2 heard,  No murmurs  Gastrointestinal system: Abdomen soft, non-tender, nondistended. Normal bowel sound. No organomegaly Central nervous system: Alert and oriented. No focal neurological deficits. Extremities: No cyanosis, clubbing or edema Skin: No rashes or ulcers Psychiatry:  Mood & affect appropriate.     Data Reviewed: I have personally reviewed following labs and imaging studies  CBC: Recent Labs  Lab 12/06/18 0343 12/06/18 1010 12/07/18 0500 12/07/18 0715 12/08/18 0310 12/09/18 0529  WBC 13.2*  --  21.4*  21.6* DUPL SEE T42104 21.7* 15.2*  NEUTROABS  --   --  18.4* PENDING  --   --   HGB 8.2* 9.5*  9.9* 8.2*  8.2* DUPL SEE T42104 7.8* 7.8*  HCT 28.3* 28.0*  29.0* 28.4*  28.7* DUPL SEE T42104 26.0* 26.2*  MCV 87.6  --  89.0  88.0 DUPL SEE T42104 87.8 86.8  PLT 277  --  250  260 DUPL SEE T42104 237 161   Basic Metabolic Panel: Recent Labs  Lab 12/05/18 0448 12/06/18 0343 12/06/18 1010 12/07/18 0500 12/08/18 0310 12/09/18 0529  NA 139 142 141  140 140 140 139  K 3.5 3.5 3.2*  3.3* 3.6 3.6 3.4*  CL 93* 97*  --  96* 97* 96*  CO2 33* 34*  --  33* 32 33*  GLUCOSE 189* 205*  --  245* 183* 169*  BUN 55* 52*  --  32* 30* 26*  CREATININE 1.63* 1.49*  --  1.40* 1.39* 1.46*  CALCIUM 7.9* 7.8*  --  8.0* 8.0* 7.9*   GFR: Estimated Creatinine Clearance: 63.9 mL/min (A) (by C-G formula based on SCr of 1.46 mg/dL (H)). Liver Function Tests: No results for input(s): AST, ALT, ALKPHOS, BILITOT, PROT, ALBUMIN in the last 168 hours. No results for input(s): LIPASE, AMYLASE in the last 168 hours. No results for input(s): AMMONIA in the last 168 hours. Coagulation Profile: No results for input(s): INR, PROTIME in the last 168 hours. Cardiac Enzymes: No results for input(s): CKTOTAL, CKMB, CKMBINDEX, TROPONINI in the last 168 hours. BNP (last 3 results) No results for input(s): PROBNP in the last  8760 hours. HbA1C: No results for input(s): HGBA1C in the last 72 hours. CBG: Recent Labs  Lab 12/08/18 1132 12/08/18 1700 12/08/18 2130 12/09/18 0740 12/09/18 1146  GLUCAP 241* 107* 185* 160* 200*   Lipid Profile: No results for input(s): CHOL, HDL, LDLCALC, TRIG, CHOLHDL, LDLDIRECT in the last 72 hours. Thyroid Function Tests: No results for input(s): TSH, T4TOTAL, FREET4, T3FREE, THYROIDAB in the last 72 hours. Anemia Panel: No results for input(s): VITAMINB12, FOLATE, FERRITIN, TIBC, IRON, RETICCTPCT in the last 72 hours. Urine analysis:    Component  Value Date/Time   COLORURINE STRAW (A) 12/01/2018 0422   APPEARANCEUR CLEAR 12/01/2018 0422   LABSPEC 1.006 12/01/2018 0422   PHURINE 5.0 12/01/2018 0422   GLUCOSEU NEGATIVE 12/01/2018 0422   HGBUR MODERATE (A) 12/01/2018 0422   BILIRUBINUR NEGATIVE 12/01/2018 0422   KETONESUR NEGATIVE 12/01/2018 0422   PROTEINUR NEGATIVE 12/01/2018 0422   UROBILINOGEN 0.2 10/26/2014 2206   NITRITE NEGATIVE 12/01/2018 0422   LEUKOCYTESUR NEGATIVE 12/01/2018 0422   Sepsis Labs: _0 (procalcitonin:4,lacticidven:4) ) Recent Results (from the past 240 hour(s))  Culture, blood (routine x 2)     Status: None   Collection Time: 12/01/18  4:00 PM  Result Value Ref Range Status   Specimen Description BLOOD RIGHT HAND  Final   Special Requests   Final    BOTTLES DRAWN AEROBIC ONLY Blood Culture adequate volume   Culture   Final    NO GROWTH 5 DAYS Performed at [facility], [place] [address], [name], [place] [address]    Report Status 12/06/2018 FINAL  Final  Culture, respiratory (non-expectorated)     Status: None   Collection Time: 12/01/18  4:03 PM  Result Value Ref Range Status   Specimen Description TRACHEAL ASPIRATE  Final   Special Requests NONE  Final   Gram Stain   Final    RARE WBC PRESENT, PREDOMINANTLY PMN RARE GRAM POSITIVE COCCI RARE GRAM POSITIVE RODS Performed at [facility], [place] [address],  [place], [place] [address]    Culture FEW Consistent with normal respiratory flora.  Final   Report Status 12/04/2018 FINAL  Final  Culture, blood (routine x 2)     Status: None   Collection Time: 12/01/18  4:10 PM  Result Value Ref Range Status   Specimen Description BLOOD RIGHT HAND  Final   Special Requests   Final    BOTTLES DRAWN AEROBIC ONLY Blood Culture results may not be optimal due to an inadequate volume of blood received in culture bottles   Culture   Final    NO GROWTH 5 DAYS Performed at [facility], [place] [address], [place], [place] [address]    Report Status 12/06/2018 FINAL  Final         Radiology Studies: No results found.    Scheduled Meds: . azithromycin  500 mg Oral Daily  . bisoprolol  5 mg Oral Daily  . diazepam  10 mg Oral Daily  . furosemide  80 mg Intravenous BID  . gabapentin  300 mg Oral BID  . insulin aspart  0-15 Units Subcutaneous TID WC  . insulin aspart  5 Units Subcutaneous TID WC  . insulin glargine  15 Units Subcutaneous Daily  . ipratropium-albuterol  3 mL Nebulization TID  . isosorbide-hydrALAZINE  1 tablet Oral TID  . mouth rinse  15 mL Mouth Rinse BID  . mometasone-formoterol  2 puff Inhalation BID  . pantoprazole  40 mg Oral QHS  . potassium chloride  40 mEq Oral BID  . rivaroxaban  20 mg Oral Q supper  . rosuvastatin  20 mg Oral Daily  . sodium chloride flush  3 mL Intravenous Q12H   Continuous Infusions: . sodium chloride 250 mL (12/09/18 1157)  . ceFEPime (MAXIPIME) IV 2 g (12/09/18 1158)     LOS: 8 days      Debbe Odea, MD [facility] Pager: [email or web address] Password [email or web address] 12/09/2018, 1:18 PM

## 2018-12-09 NOTE — Progress Notes (Signed)
Patient's BP is on low side- per MD (CHF).   Will given Lasix now, then reassess and give patient his valium and pain meds after check BP again

## 2018-12-09 NOTE — Plan of Care (Signed)
  Problem: Health Behavior/Discharge Planning: Goal: Ability to manage health-related needs will improve Outcome: Progressing   Problem: Clinical Measurements: Goal: Ability to maintain clinical measurements within normal limits will improve Outcome: Progressing Goal: Respiratory complications will improve Outcome: Progressing Goal: Cardiovascular complication will be avoided Outcome: Progressing   Problem: Nutrition: Goal: Adequate nutrition will be maintained Outcome: Progressing   Problem: Coping: Goal: Level of anxiety will decrease Outcome: Progressing   Problem: Elimination: Goal: Will not experience complications related to bowel motility Outcome: Progressing Goal: Will not experience complications related to urinary retention Outcome: Progressing   Problem: Pain Managment: Goal: General experience of comfort will improve Outcome: Progressing   Problem: Safety: Goal: Ability to remain free from injury will improve Outcome: Progressing   Problem: Skin Integrity: Goal: Risk for impaired skin integrity will decrease Outcome: Progressing   Problem: Education: Goal: Ability to demonstrate management of disease process will improve Outcome: Progressing Goal: Ability to verbalize understanding of medication therapies will improve Outcome: Progressing   Problem: Activity: Goal: Capacity to carry out activities will improve Outcome: Progressing   Problem: Cardiac: Goal: Ability to achieve and maintain adequate cardiopulmonary perfusion will improve Outcome: Progressing

## 2018-12-10 LAB — GLUCOSE, CAPILLARY
GLUCOSE-CAPILLARY: 131 mg/dL — AB (ref 70–99)
Glucose-Capillary: 151 mg/dL — ABNORMAL HIGH (ref 70–99)
Glucose-Capillary: 143 mg/dL — ABNORMAL HIGH (ref 70–99)
Glucose-Capillary: 167 mg/dL — ABNORMAL HIGH (ref 70–99)

## 2018-12-10 LAB — BASIC METABOLIC PANEL
Anion gap: 13 (ref 5–15)
BUN: 28 mg/dL — ABNORMAL HIGH (ref 8–23)
CHLORIDE: 94 mmol/L — AB (ref 98–111)
CO2: 32 mmol/L (ref 22–32)
Calcium: 8.3 mg/dL — ABNORMAL LOW (ref 8.9–10.3)
Creatinine, Ser: 1.56 mg/dL — ABNORMAL HIGH (ref 0.61–1.24)
GFR calc Af Amer: 51 mL/min — ABNORMAL LOW (ref >60)
GFR calc non Af Amer: 44 mL/min — ABNORMAL LOW (ref >60)
Glucose, Bld: 160 mg/dL — ABNORMAL HIGH (ref 70–99)
Potassium: 3.6 mmol/L (ref 3.5–5.1)
Sodium: 139 mmol/L (ref 135–145)

## 2018-12-10 LAB — CBC
HEMATOCRIT: 25.4 % — AB (ref 39.0–52.0)
Hemoglobin: 7.6 g/dL — ABNORMAL LOW (ref 13.0–17.0)
MCH: 25.9 pg — ABNORMAL LOW (ref 26.0–34.0)
MCHC: 29.9 g/dL — ABNORMAL LOW (ref 30.0–36.0)
MCV: 86.4 fL (ref 80.0–100.0)
Platelets: 276 10*3/uL (ref 150–400)
RBC: 2.94 MIL/uL — ABNORMAL LOW (ref 4.22–5.81)
RDW: 18 % — ABNORMAL HIGH (ref 11.5–15.5)
WBC: 10.8 10*3/uL — ABNORMAL HIGH (ref 4.0–10.5)
nRBC: 0.2 % (ref 0.0–0.2)

## 2018-12-10 MED ORDER — FUROSEMIDE 10 MG/ML IJ SOLN
80.0000 mg | Freq: Two times a day (BID) | INTRAMUSCULAR | Status: DC
  Administered 2018-12-10 – 2018-12-11 (×4): 80 mg via INTRAVENOUS
  Filled 2018-12-10 (×4): qty 8

## 2018-12-10 MED ORDER — CEFDINIR 300 MG PO CAPS
300.0000 mg | ORAL_CAPSULE | Freq: Two times a day (BID) | ORAL | Status: DC
  Administered 2018-12-10 – 2018-12-12 (×5): 300 mg via ORAL
  Filled 2018-12-10 (×6): qty 1

## 2018-12-10 MED ORDER — METOLAZONE 2.5 MG PO TABS
2.5000 mg | ORAL_TABLET | Freq: Once | ORAL | Status: AC
  Administered 2018-12-10: 2.5 mg via ORAL
  Filled 2018-12-10: qty 1

## 2018-12-10 NOTE — Progress Notes (Signed)
Pt was being weighed and stated "I do not want to be woke up in the morning to be weighed. It is ridiculous to be woke up to be weighed. Ask the doctor if he wants to be woken up at 5am to be weighed".

## 2018-12-10 NOTE — Progress Notes (Addendum)
Physical Therapy Treatment Patient Details Name: Nicholas Caldwell MRN: [id number] DOB: 03/16/1946 Today's Date: 12/10/2018    History of Present Illness Pt is a 73 y.o. male admitted 11/30/18 with hypotension and lethargy; found to have AKI with hyperkalemia. ETT 1/29-1/30. Also with CHF. S/p heart cath 2/3. PMH includes COPD, CHF, CAD, aflutter, PAF, peripheral neuropathy.    PT Comments    Pt received up in chair, agreeable to participate. Received on 2L/min (RN later reports should have been 3L), AMB on 4L with SpO2 drop to 89% but more significantly limited by DOE and upper substernal pain with effort. Pt asks to sit d/t fatigue. O2 is increased to 6L/min, pt tolerating AMB much better. Walking remains slow, and pt is educated on strategies for incorporating more nasal breathing when he is AMB. Pt left on 3.5L as requested by patient, RN notified.    Follow Up Recommendations  Home health PT;Supervision/Assistance - 24 hour     Equipment Recommendations  None recommended by PT    Recommendations for Other Services       Precautions / Restrictions Precautions Precautions: Fall Precaution Comments: 3-4L O2 [place] baseline Restrictions Weight Bearing Restrictions: No    Mobility  Bed Mobility               General bed mobility comments: pt received in chair  Transfers   Equipment used: Rolling walker (2 wheeled) Transfers: Sit to/from Stand Sit to Stand: Min guard         General transfer comment: heavy BUE contribution 2/2 legs weakness, performed twice in session   Ambulation/Gait Ambulation/Gait assistance: Min guard Gait Distance (Feet): 100 Feet(73f on 4L, then 1067fon 6L ) Assistive device: Rolling walker (2 wheeled);4-wheeled walker(4WW used for sitting as pt requested emergne tneed to sit and rest mid AMB ) Gait Pattern/deviations: Trunk flexed Gait velocity: 0.1557m   General Gait Details: slow, labored, heavy BUE weight bearing on RW    Stairs             Wheelchair Mobility    Modified Rankin (Stroke Patients Only)       Balance Overall balance assessment: Mild deficits observed, not formally tested;Modified Independent                                          Cognition Arousal/Alertness: Awake/alert Behavior During Therapy: WFL for tasks assessed/performed Overall Cognitive Status: Within Functional Limits for tasks assessed                                        Exercises      General Comments        Pertinent Vitals/Pain Pain Assessment: Faces Faces Pain Scale: Hurts little more Pain Location: Back, R hip, Lt foot swolen  Pain Descriptors / Indicators: Discomfort;Grimacing;Constant Pain Intervention(s): Limited activity within patient's tolerance;Monitored during session    Home Living                      Prior Function            PT Goals (current goals can now be found in the care plan section) Acute Rehab PT Goals Patient Stated Goal: return home PT Goal Formulation: With patient Time For Goal Achievement: 12/17/18 Potential to Achieve Goals: Fair  Progress towards PT goals: Not progressing toward goals - comment(continues to have breathing related CP with household distances, better on 6L/min. )    Frequency    Min 3X/week      PT Plan Current plan remains appropriate    Co-evaluation              AM-PAC PT "6 Clicks" Mobility   Outcome Measure  Help needed turning from your back to your side while in a flat bed without using bedrails?: A Little Help needed moving from lying on your back to sitting on the side of a flat bed without using bedrails?: A Little Help needed moving to and from a bed to a chair (including a wheelchair)?: A Little Help needed standing up from a chair using your arms (e.g., wheelchair or bedside chair)?: A Little Help needed to walk in hospital room?: A Little Help needed climbing 3-5 steps with a railing? : A  Lot 6 Click Score: 17    End of Session Equipment Utilized During Treatment: Oxygen Activity Tolerance: Patient limited by fatigue Patient left: in chair;with call bell/phone within reach;with nursing/sitter in room Nurse Communication: Mobility status PT Visit Diagnosis: Other abnormalities of gait and mobility (R26.89);Muscle weakness (generalized) (M62.81)     Time: 3200-3794 PT Time Calculation (min) (ACUTE ONLY): 23 min  Charges:  $Therapeutic Exercise: 23-37 mins                     1:32 PM, 12/10/18 Etta Grandchild, PT, DPT Physical Therapist - [place] [phone] (Pager)  [phone] (Office)      , C 12/10/2018, 1:30 PM

## 2018-12-10 NOTE — Progress Notes (Signed)
PROGRESS NOTE    Nicholas Caldwell   PYK:[id number]  DOB: [birth year]  DOA: 11/30/2018 PCP: [facility]   Brief Narrative:  Nicholas Caldwell  is a 73 yo with hx of A fib, COPD, systolic CHF, and chronic pain reportedly took extra pain medications before going to bed. Afterward his wife was worried that he was getting too sleepy and shaking, and that his blood pressure might be too low. He was brought to the ER and intubated due to respiratory failure and airway protection. He was found to have hyperkalemia and acute renal failure. He takes valium, neurontin, and percocet at home.  He was consulted for acute on chronic systolic heart failure.  He was eventually extubated and transferred to [facility] 2/1. He underwent heart cath 2/3.    Subjective: Cough is now dry.     Assessment & Plan:   Principal Problem:   Acute respiratory failure with hypoxia and hypercapnia/ VDRF - multifactorial>  COPD exacerbation, acute CHF, possibly polypharmacy and undiagnosed OSA - steroids have been weaned off - CXR 2/4 > Bilateral asymmetric airspace disease, left greater than right could reflect asymmetric edema or infection. - started on treatment for possible pneumonia with Cefepime and Azithromycin on 2/4- leukocytosis improving- cough improving- will switch Cefepime to Cefdinir today- recommend 7 day course - U strep antigen is negative - for CHF > being diuresed by cardiology - O2 weaned to 2 L O2 which is his baseline  Active Problems: Nonischemic cardiomyopathy-   Essential hypertension   CAD - Non-obstructive by LHC1/16   Acute on chronic combined systolic and diastolic CHF (congestive heart failure)  - - heart failure team following and assisting with management - cont Bisoprolol, IV lasix, Zaroxolyn, Bidil-  - continue IV Lasix per cardiology  Anemia - Hb 7.6 today- has been anywhere from 8-11 in the past month - received Feraheme 2/4 - still awaiting hemoccult- hold off transfusion  today and follow    AKI (acute kidney injury)   - suspected to be due to ATN-- elevated to 3.48 on 12/01/18-  baseline Cr 1.0   - follow while diuresing     Type 2 diabetes mellitus with neuropathy - cont Lantus and Novolog    Hyperlipidemia   - cotn Zebeta    PAF (paroxysmal atrial fibrillation) - cont Xarelto and Bisoprolol     Time spent in minutes: 35 DVT prophylaxis: Xarelto Code Status: Full code Family Communication:  Disposition Plan: home   Consultants:   PCCM  Heart failure team Procedures:   Cardiac cath 2/3  Central line 1/29 Antimicrobials:  Anti-infectives (From admission, onward)   Start     Dose/Rate Route Frequency Ordered Stop   12/10/18 1000  cefdinir (OMNICEF) capsule 300 mg     300 mg Oral Every 12 hours 12/10/18 0752     12/09/18 1000  azithromycin (ZITHROMAX) tablet 500 mg     500 mg Oral Daily 12/08/18 1500     12/07/18 1200  ceFEPIme (MAXIPIME) 2 g in sodium chloride 0.9 % 100 mL IVPB  Status:  Discontinued     2 g 200 mL/hr over 30 Minutes Intravenous Every 12 hours 12/07/18 1158 12/10/18 0752   12/07/18 1100  ceFEPIme (MAXIPIME) 1 g in sodium chloride 0.9 % 100 mL IVPB  Status:  Discontinued     1 g 200 mL/hr over 30 Minutes Intravenous Every 24 hours 12/07/18 1023 12/07/18 1158   12/07/18 1100  azithromycin (ZITHROMAX) 500 mg in sodium chloride 0.9 %  250 mL IVPB  Status:  Discontinued     500 mg 250 mL/hr over 60 Minutes Intravenous Every 24 hours 12/07/18 1023 12/08/18 1500       Objective: Vitals:   12/10/18 0918 12/10/18 1220 12/10/18 1320 12/10/18 1342  BP:    (!) 110/51  Pulse:    73  Resp:    18  Temp:    (!) 97.4 F (36.3 C)  TempSrc:    Oral  SpO2: 98% (!) 89% 97% 98%  Weight:      Height:        Intake/Output Summary (Last 24 hours) at 12/10/2018 1359 Last data filed at 12/10/2018 0900 Gross per 24 hour  Intake 963 ml  Output 1625 ml  Net -662 ml   Filed Weights   12/08/18 0623 12/09/18 0443 12/10/18 0508    Weight: 124.7 kg 123.6 kg 124.1 kg    Examination: General exam: Appears comfortable  HEENT: PERRLA, oral mucosa moist, no sclera icterus or thrush Respiratory system: mild rhonchi bilaterally, Respiratory effort normal. Cardiovascular system: S1 & S2 heard,  No murmurs  Gastrointestinal system: Abdomen soft, non-tender, nondistended. Normal bowel sound. No organomegaly Central nervous system: Alert and oriented. No focal neurological deficits. Extremities: No cyanosis, clubbing or edema Skin: No rashes or ulcers Psychiatry:  Mood & affect appropriate.   Data Reviewed: I have personally reviewed following labs and imaging studies  CBC: Recent Labs  Lab 12/07/18 0500 12/07/18 0715 12/08/18 0310 12/09/18 0529 12/10/18 0631  WBC 21.4*  21.6* DUPL SEE [id number] 21.7* 15.2* 10.8*  NEUTROABS 18.4* PENDING  --   --   --   HGB 8.2*  8.2* DUPL SEE [id number] 7.8* 7.8* 7.6*  HCT 28.4*  28.7* DUPL SEE T42104 26.0* 26.2* 25.4*  MCV 89.0  88.0 DUPL SEE T42104 87.8 86.8 86.4  PLT 250  260 DUPL SEE [id number] 237 240 762   Basic Metabolic Panel: Recent Labs  Lab 12/06/18 0343 12/06/18 1010 12/07/18 0500 12/08/18 0310 12/09/18 0529 12/10/18 0631  NA 142 141  140 140 140 139 139  K 3.5 3.2*  3.3* 3.6 3.6 3.4* 3.6  CL 97*  --  96* 97* 96* 94*  CO2 34*  --  33* 32 33* 32  GLUCOSE 205*  --  245* 183* 169* 160*  BUN 52*  --  32* 30* 26* 28*  CREATININE 1.49*  --  1.40* 1.39* 1.46* 1.56*  CALCIUM 7.8*  --  8.0* 8.0* 7.9* 8.3*   GFR: Estimated Creatinine Clearance: 60 mL/min (A) (by C-G formula based on SCr of 1.56 mg/dL (H)). Liver Function Tests: No results for input(s): AST, ALT, ALKPHOS, BILITOT, PROT, ALBUMIN in the last 168 hours. No results for input(s): LIPASE, AMYLASE in the last 168 hours. No results for input(s): AMMONIA in the last 168 hours. Coagulation Profile: No results for input(s): INR, PROTIME in the last 168 hours. Cardiac Enzymes: No results for input(s):  CKTOTAL, CKMB, CKMBINDEX, TROPONINI in the last 168 hours. BNP (last 3 results) No results for input(s): PROBNP in the last 8760 hours. HbA1C: No results for input(s): HGBA1C in the last 72 hours. CBG: Recent Labs  Lab 12/09/18 0740 12/09/18 1146 12/09/18 1626 12/09/18 2111 12/10/18 0810  GLUCAP 160* 200* 162* 197* 151*   Lipid Profile: No results for input(s): CHOL, HDL, LDLCALC, TRIG, CHOLHDL, LDLDIRECT in the last 72 hours. Thyroid Function Tests: No results for input(s): TSH, T4TOTAL, FREET4, T3FREE, THYROIDAB in the last 72 hours. Anemia Panel: No results  for input(s): VITAMINB12, FOLATE, FERRITIN, TIBC, IRON, RETICCTPCT in the last 72 hours. Urine analysis:    Component Value Date/Time   COLORURINE STRAW (A) 12/01/2018 0422   APPEARANCEUR CLEAR 12/01/2018 0422   LABSPEC 1.006 12/01/2018 0422   PHURINE 5.0 12/01/2018 0422   GLUCOSEU NEGATIVE 12/01/2018 0422   HGBUR MODERATE (A) 12/01/2018 0422   BILIRUBINUR NEGATIVE 12/01/2018 0422   KETONESUR NEGATIVE 12/01/2018 0422   PROTEINUR NEGATIVE 12/01/2018 0422   UROBILINOGEN 0.2 10/26/2014 2206   NITRITE NEGATIVE 12/01/2018 0422   LEUKOCYTESUR NEGATIVE 12/01/2018 0422   Sepsis Labs: _0 (procalcitonin:4,lacticidven:4) ) Recent Results (from the past 240 hour(s))  Culture, blood (routine x 2)     Status: None   Collection Time: 12/01/18  4:00 PM  Result Value Ref Range Status   Specimen Description BLOOD RIGHT HAND  Final   Special Requests   Final    BOTTLES DRAWN AEROBIC ONLY Blood Culture adequate volume   Culture   Final    NO GROWTH 5 DAYS Performed at [facility], [place] [address], [place], [place] [address]    Report Status 12/06/2018 FINAL  Final  Culture, respiratory (non-expectorated)     Status: None   Collection Time: 12/01/18  4:03 PM  Result Value Ref Range Status   Specimen Description TRACHEAL ASPIRATE  Final   Special Requests NONE  Final   Gram Stain   Final    RARE WBC PRESENT,  PREDOMINANTLY PMN RARE GRAM POSITIVE COCCI RARE GRAM POSITIVE RODS Performed at [facility], [place] [address] [place], [place] [address]    Culture FEW Consistent with normal respiratory flora.  Final   Report Status 12/04/2018 FINAL  Final  Culture, blood (routine x 2)     Status: None   Collection Time: 12/01/18  4:10 PM  Result Value Ref Range Status   Specimen Description BLOOD RIGHT HAND  Final   Special Requests   Final    BOTTLES DRAWN AEROBIC ONLY Blood Culture results may not be optimal due to an inadequate volume of blood received in culture bottles   Culture   Final    NO GROWTH 5 DAYS Performed at [facility], [place] [address], [place], [place] [address]    Report Status 12/06/2018 FINAL  Final         Radiology Studies: No results found.    Scheduled Meds: . azithromycin  500 mg Oral Daily  . bisoprolol  5 mg Oral Daily  . cefdinir  300 mg Oral Q12H  . diazepam  10 mg Oral Daily  . furosemide  80 mg Intravenous BID  . gabapentin  300 mg Oral BID  . insulin aspart  0-15 Units Subcutaneous TID WC  . insulin aspart  5 Units Subcutaneous TID WC  . insulin glargine  15 Units Subcutaneous Daily  . ipratropium-albuterol  3 mL Nebulization TID  . isosorbide-hydrALAZINE  1 tablet Oral TID  . mouth rinse  15 mL Mouth Rinse BID  . mometasone-formoterol  2 puff Inhalation BID  . pantoprazole  40 mg Oral QHS  . potassium chloride  40 mEq Oral BID  . rivaroxaban  20 mg Oral Q supper  . rosuvastatin  20 mg Oral Daily  . sodium chloride flush  3 mL Intravenous Q12H   Continuous Infusions: . sodium chloride 250 mL (12/09/18 2102)     LOS: 9 days      Debbe Odea, MD [facility] Pager: [email or web address] Password [facility] 12/10/2018, 1:59  PM  

## 2018-12-10 NOTE — Progress Notes (Signed)
[facility]  Change Rocephin to Cefdinir. Hold IV Lasix until reassessed this AM.   Debbe Odea, MD

## 2018-12-10 NOTE — Progress Notes (Addendum)
Patient ID: Nicholas Caldwell, male   DOB: 1946/07/17, 73 y.o.   MRN: [id number]     Advanced Heart Failure Rounding Note  PCP-Cardiologist: No primary care provider on file.   Subjective:    Negative 1.2 L with IV lasix 80 mg BID and dose of metolazone. Weight shows up 1 lbs, but bed weight.   Cr 1.46 -> 1.56. BUN 28.  Feeling about the same this am. Had BM yesterday, but didn't check to see if dark or no. Had dark stools on Monday. Remains SOB with exertion and somewhat orthopneic.   Hgb 8.2 -> 7.8 -> 7.8 -> 7.6.    RHC/LHC (2/3):  Coronary Findings   Diagnostic  Dominance: Co-dominant  Left Main  No significant disease.  Left Anterior Descending  50-60% distal LAD stenosis.  Ramus Intermedius  Moderate vessel, no significant disease.  Left Circumflex  50% proximal LCx stenosis. Large vessel, co-dominant.  Right Coronary Artery  Relatively small RCA. 50% proximal stenosis.  Intervention   No interventions have been documented.  Right Heart   Right Heart Pressures RHC Procedural Findings: Hemodynamics (mmHg) RA mean 10 RV 56/9 PA 52/16, mean 30 PCWP mean 10 LV 95/10 AO 94/48  Oxygen saturations: PA 65% AO 96%  Cardiac Output (Fick) 9.2  Cardiac Index (Fick) 3.85 PVR 2.2 WU     Objective:   Weight Range: 124.1 kg Body mass index is 37.12 kg/m.   Vital Signs:   Temp:  [98.2 F (36.8 C)-98.6 F (37 C)] 98.6 F (37 C) (02/07 0508) Pulse Rate:  [63-98] 63 (02/07 0508) Resp:  [18-20] 20 (02/07 0508) BP: (78-122)/(47-73) 118/73 (02/07 0508) SpO2:  [95 %-99 %] 99 % (02/07 0508) Weight:  [124.1 kg] 124.1 kg (02/07 0508) Last BM Date: 12/09/18  Weight change: Filed Weights   12/08/18 0623 12/09/18 0443 12/10/18 0508  Weight: 124.7 kg 123.6 kg 124.1 kg   Intake/Output:   Intake/Output Summary (Last 24 hours) at 12/10/2018 0842 Last data filed at 12/10/2018 0515 Gross per 24 hour  Intake 1203 ml  Output 2475 ml  Net -1272 ml    Physical Exam    General: NAD HEENT: Normal Neck: Supple. JVP to jaw. Carotids 2+ bilat; no bruits. No thyromegaly or nodule noted. Cor: PMI nondisplaced. RRR, No M/G/R noted Lungs: Diminished basilar sounds.  Abdomen: Soft, non-tender, non-distended, no HSM. No bruits or masses. +BS  Extremities: No cyanosis, clubbing, or rash. R and LLE no edema.  Neuro: Alert & orientedx3, cranial nerves grossly intact. moves all 4 extremities w/o difficulty. Affect pleasant   Telemetry   NSR 70-80s, personally reviewed.   EKG   No new tracings.    Labs    CBC Recent Labs    12/09/18 0529 12/10/18 0631  WBC 15.2* 10.8*  HGB 7.8* 7.6*  HCT 26.2* 25.4*  MCV 86.8 86.4  PLT 240 119   Basic Metabolic Panel Recent Labs    12/09/18 0529 12/10/18 0631  NA 139 139  K 3.4* 3.6  CL 96* 94*  CO2 33* 32  GLUCOSE 169* 160*  BUN 26* 28*  CREATININE 1.46* 1.56*  CALCIUM 7.9* 8.3*   Liver Function Tests No results for input(s): AST, ALT, ALKPHOS, BILITOT, PROT, ALBUMIN in the last 72 hours. No results for input(s): LIPASE, AMYLASE in the last 72 hours. Cardiac Enzymes No results for input(s): CKTOTAL, CKMB, CKMBINDEX, TROPONINI in the last 72 hours.  BNP: BNP (last 3 results) Recent Labs    11/16/18 0237 11/21/18 2238  12/01/18 0015  BNP 606.1* 1,739.7* 1,666.7*    ProBNP (last 3 results) No results for input(s): PROBNP in the last 8760 hours.   D-Dimer No results for input(s): DDIMER in the last 72 hours. Hemoglobin A1C No results for input(s): HGBA1C in the last 72 hours. Fasting Lipid Panel No results for input(s): CHOL, HDL, LDLCALC, TRIG, CHOLHDL, LDLDIRECT in the last 72 hours. Thyroid Function Tests No results for input(s): TSH, T4TOTAL, T3FREE, THYROIDAB in the last 72 hours.  Invalid input(s): FREET3  Other results:   Imaging    No results found.   Medications:     Scheduled Medications: . azithromycin  500 mg Oral Daily  . bisoprolol  5 mg Oral Daily  .  cefdinir  300 mg Oral Q12H  . diazepam  10 mg Oral Daily  . gabapentin  300 mg Oral BID  . insulin aspart  0-15 Units Subcutaneous TID WC  . insulin aspart  5 Units Subcutaneous TID WC  . insulin glargine  15 Units Subcutaneous Daily  . ipratropium-albuterol  3 mL Nebulization TID  . isosorbide-hydrALAZINE  1 tablet Oral TID  . mouth rinse  15 mL Mouth Rinse BID  . mometasone-formoterol  2 puff Inhalation BID  . pantoprazole  40 mg Oral QHS  . potassium chloride  40 mEq Oral BID  . rivaroxaban  20 mg Oral Q supper  . rosuvastatin  20 mg Oral Daily  . sodium chloride flush  3 mL Intravenous Q12H    Infusions: . sodium chloride 250 mL (12/09/18 2102)    PRN Medications: sodium chloride, acetaminophen, albuterol, docusate sodium, guaiFENesin-dextromethorphan, ondansetron (ZOFRAN) IV, oxyCODONE-acetaminophen, sodium chloride flush, sodium chloride flush    Patient Profile   Nicholas Caldwell is a 73 y.o. male with PMH of chronic systolic CHF/nonischemic cardiomyopathy Echo 12/13/2016 LVEF 40-45%, COPD on home oxygen, PAFs/p DCCV 2018,and history of non compliance.  Admitted overnight with acute respiratory failure, acute renal failure, and hypotension.   Assessment/Plan   1. Acute on chronic hypercarbic/hypoxemic respiratory failure: He is on home oxygen at baseline with COPD.  Patient was intubated at admission. Likely multifactorial.  He initially appeared volume overloaded on exam with pulmonary edema on CXR. Also concern for oversedation from home pain meds and COPD.  [place] 12/06/2018 showed filling pressures fairly well optimized.   - CXR 12/07/18 showed L>R airspace disease; Edema vs infection.   - Will need eventual sleep study. Was scheduled for 11/30/2018, will need to reschedule. - Diuresis as below.  - Now on ABX. For ? PNA.  2. Acute on chronic systolic CHF: Echo in 1/61 with EF 40-45%, mild RV dilation.  Nonischemic cardiomyopathy.  He was admitted with hypotension and was  initially on norepinephrine.  Pulmonary edema on initial CXR and volume overloaded on exam.  Also with AKI at admission. Echo 12/02/2018 LVEF stable at 40-45%. RHC/LHC 2/3 with nonobstructive CAD, filling pressures near-optimized. Good cardiac output.  Volume status remains elevated.  - Repeat 80 mg IV lasix BID with dose of metolazone this am.  - Holding lisinopril and spironolactone with AKI.  - Continue Bidil 1 tab TID - Continue bisoprolol 5 mg daily.  3. AKI likely due to ATN: Baseline creatinine 1.0. Creatinine 3.48 on 12/01/18.   - Holding lisinopril and spironolactone with AKI, - Cr stable on po diuretics.  4. Shock/hypotension: Presented with low BP, initially required norepinephrine. Unclear at this time what caused his presentation, may have been over-sedation with home pain meds. Good cardiac output  on RHC.  5. COPD: He is now off steroids.  Diffuse rhonchi on lung exam.  - CXR 12/07/18 showed L>R airspace disease; Edema vs infection.   - Further treatment per primary service - Currently on ABX.  6. Atrial flutter/fibrillation: Recurrent. Likely in setting of acute illness. Now rate controlled.  He has previously failed amiodarone due to markedly prolonged QT. Not tikosyn candidate for same reason.  - Now in sinus. Consider ablation down the road.  - Continue Xarelto.   - Continue current bisoprolol.  7. Elevated troponin: Mild troponin elevation with no trend. Likely demand ischemia with volume overload/hypotension.  Coronary angiography showed nonobstructive CAD.  - No s/s of ischemia.    - Continue statin.   8. Anemia: Slow down-trend in hemoglobin, 7.6 today. No overt bleeding but having dark stools.  No overt bleeding. Transferrin saturation 15%.  - Further per primary.  Hgb continues to trend down. Likely needs GI consultation.  - Guaiac stool.  - Feraheme given.  - Watch CBC closely with Xarelto use.   9. Hypokalemia - K 3.6 this am. Will give supp. Ordered for 40 meq  BID  Length of Stay: [place], [place]  12/10/2018, 8:42 AM  Advanced Heart Failure Team Pager [phone] (M-F; 7a - 4p)  Please contact [facility] for night-coverage after hours (4p -7a ) and weekends on [email or web address]]  Patient seen with PA, agree with the above note.   He diuresed well again yesterday.  Still short of breath with ambulation and orthopneic but on his home oxygen (2L [place]). JVP still 12+ range on exam.  Creatinine mildly higher at 1.56.  Think he needs further diuresis.  - Continue Lasix 80 mg IV bid today and will give a dose of metolazone 2.5 x 1 again.  - Replace K.   On abx for PNA, transitioned to Cefdinir today.   He is back in NSR this morning. Unfortunately not candidate for Tikosyn or amiodarone. He is on Xarelto.  Would consider him for atrial fibrillation ablation in the future.  Hgb stably low today at 7.6, got feraheme. Awaiting FOBT.   Hopefully 1-2 more days of IV diuresis will get him ready for home.   Loralie Champagne 12/10/2018 9:47 AM

## 2018-12-10 NOTE — Progress Notes (Signed)
SATURATION QUALIFICATIONS: (This note is used to comply with regulatory documentation for home oxygen)  Patient Saturations on Room Air at Rest = 98 % at 2 L  Patient Saturations on Room Air while Ambulating = 89% at 4L  Patient Saturations on 6 Liters of oxygen while Ambulating = 92%   Please briefly explain why patient needs home oxygen:

## 2018-12-10 NOTE — Care Management Important Message (Signed)
Important Message  Patient Details  Name: Nicholas Caldwell MRN: [id number] Date of Birth: 1946/03/09   Medicare Important Message Given:  Yes    Kia Varnadore P Layney Gillson 12/10/2018, 1:24 PM

## 2018-12-11 LAB — CBC
HEMATOCRIT: 24.7 % — AB (ref 39.0–52.0)
Hemoglobin: 7.6 g/dL — ABNORMAL LOW (ref 13.0–17.0)
MCH: 26.7 pg (ref 26.0–34.0)
MCHC: 30.8 g/dL (ref 30.0–36.0)
MCV: 86.7 fL (ref 80.0–100.0)
Platelets: 266 10*3/uL (ref 150–400)
RBC: 2.85 MIL/uL — ABNORMAL LOW (ref 4.22–5.81)
RDW: 18.1 % — ABNORMAL HIGH (ref 11.5–15.5)
WBC: 8.5 10*3/uL (ref 4.0–10.5)
nRBC: 0.2 % (ref 0.0–0.2)

## 2018-12-11 LAB — BASIC METABOLIC PANEL
Anion gap: 9 (ref 5–15)
BUN: 27 mg/dL — ABNORMAL HIGH (ref 8–23)
CALCIUM: 7.9 mg/dL — AB (ref 8.9–10.3)
CO2: 34 mmol/L — AB (ref 22–32)
Chloride: 96 mmol/L — ABNORMAL LOW (ref 98–111)
Creatinine, Ser: 1.48 mg/dL — ABNORMAL HIGH (ref 0.61–1.24)
GFR calc Af Amer: 55 mL/min — ABNORMAL LOW (ref >60)
GFR calc non Af Amer: 47 mL/min — ABNORMAL LOW (ref >60)
Glucose, Bld: 221 mg/dL — ABNORMAL HIGH (ref 70–99)
Potassium: 3.5 mmol/L (ref 3.5–5.1)
Sodium: 139 mmol/L (ref 135–145)

## 2018-12-11 LAB — PREPARE RBC (CROSSMATCH)

## 2018-12-11 LAB — GLUCOSE, CAPILLARY
Glucose-Capillary: 174 mg/dL — ABNORMAL HIGH (ref 70–99)
Glucose-Capillary: 227 mg/dL — ABNORMAL HIGH (ref 70–99)
Glucose-Capillary: 131 mg/dL — ABNORMAL HIGH (ref 70–99)
Glucose-Capillary: 162 mg/dL — ABNORMAL HIGH (ref 70–99)

## 2018-12-11 LAB — ABO/RH: ABO/RH(D): B POS

## 2018-12-11 MED ORDER — SODIUM CHLORIDE 0.9% IV SOLUTION
Freq: Once | INTRAVENOUS | Status: AC
  Administered 2018-12-11: 19:00:00 via INTRAVENOUS

## 2018-12-11 MED ORDER — METOLAZONE 2.5 MG PO TABS
2.5000 mg | ORAL_TABLET | Freq: Once | ORAL | Status: AC
  Administered 2018-12-11: 2.5 mg via ORAL
  Filled 2018-12-11: qty 1

## 2018-12-11 NOTE — Plan of Care (Signed)
  Problem: Health Behavior/Discharge Planning: Goal: Ability to manage health-related needs will improve Outcome: Progressing   Problem: Clinical Measurements: Goal: Ability to maintain clinical measurements within normal limits will improve Outcome: Progressing   Problem: Clinical Measurements: Goal: Respiratory complications will improve Outcome: Progressing

## 2018-12-11 NOTE — Progress Notes (Signed)
Patient refused his weight this morning despite being educated on the importance of weighing daily.

## 2018-12-11 NOTE — Progress Notes (Signed)
PROGRESS NOTE    Bartosz Luginbill   FYT:[id number]  DOB: 08/03/1946  DOA: 11/30/2018 PCP: [facility]   Brief Narrative:  Carlous Olivares  is a 73 yo with hx of A fib, COPD, systolic CHF, and chronic pain reportedly took extra pain medications before going to bed. Afterward his wife was worried that he was getting too sleepy and shaking, and that his blood pressure might be too low. He was brought to the ER and intubated due to respiratory failure and airway protection. He was found to have hyperkalemia and acute renal failure. He takes valium, neurontin, and percocet at home.  He was consulted for acute on chronic systolic heart failure.  He was eventually extubated and transferred to [facility] 2/1. He underwent heart cath 2/3.    Subjective: Cough continues to improve. Still has dyspnea on exertion.     Assessment & Plan:   Principal Problem:   Acute respiratory failure with hypoxia and hypercapnia/ VDRF - multifactorial>  COPD exacerbation, acute CHF, possibly polypharmacy and undiagnosed OSA - steroids have been weaned off - CXR 2/4 > Bilateral asymmetric airspace disease, left greater than right could reflect asymmetric edema or infection. - started on treatment for possible pneumonia with Cefepime and Azithromycin on 2/4- leukocytosis improving- cough improving-   switched Cefepime to Cefdinir -- recommend 7 day course - U strep antigen is negative - for CHF > being diuresed by cardiology - O2 weaned to 2 L O2 which is his baseline  Active Problems: Nonischemic cardiomyopathy-   Essential hypertension   CAD - Non-obstructive by LHC1/16   Acute on chronic combined systolic and diastolic CHF (congestive heart failure)  - - heart failure team following and assisting with management - cont Bisoprolol, IV lasix, Zaroxolyn, Bidil-  - continue IV Lasix per cardiology  Anemia - Hb 7.6 today- has been anywhere from 8-11 in the past month - received Feraheme 2/4 - still  awaiting hemoccults -      AKI (acute kidney injury)   - suspected to be due to ATN-- elevated to 3.48 on 12/01/18-  baseline Cr 1.0   - follow while diuresing     Type 2 diabetes mellitus with neuropathy - cont Lantus and Novolog    Hyperlipidemia   - cotn Zebeta    PAF (paroxysmal atrial fibrillation) - cont Xarelto and Bisoprolol     Time spent in minutes: 35 DVT prophylaxis: Xarelto Code Status: Full code Family Communication:  Disposition Plan: home   Consultants:   PCCM  Heart failure team Procedures:   Cardiac cath 2/3  Central line 1/29 Antimicrobials:  Anti-infectives (From admission, onward)   Start     Dose/Rate Route Frequency Ordered Stop   12/10/18 1000  cefdinir (OMNICEF) capsule 300 mg     300 mg Oral Every 12 hours 12/10/18 0752     12/09/18 1000  azithromycin (ZITHROMAX) tablet 500 mg     500 mg Oral Daily 12/08/18 1500     12/07/18 1200  ceFEPIme (MAXIPIME) 2 g in sodium chloride 0.9 % 100 mL IVPB  Status:  Discontinued     2 g 200 mL/hr over 30 Minutes Intravenous Every 12 hours 12/07/18 1158 12/10/18 0752   12/07/18 1100  ceFEPIme (MAXIPIME) 1 g in sodium chloride 0.9 % 100 mL IVPB  Status:  Discontinued     1 g 200 mL/hr over 30 Minutes Intravenous Every 24 hours 12/07/18 1023 12/07/18 1158   12/07/18 1100  azithromycin (ZITHROMAX) 500 mg in  sodium chloride 0.9 % 250 mL IVPB  Status:  Discontinued     500 mg 250 mL/hr over 60 Minutes Intravenous Every 24 hours 12/07/18 1023 12/08/18 1500       Objective: Vitals:   12/11/18 0741 12/11/18 0832 12/11/18 1237 12/11/18 1439  BP:   97/69   Pulse:   73   Resp:   20   Temp:   99.4 F (37.4 C)   TempSrc:   Oral   SpO2: 98%  96% 96%  Weight:  129 kg    Height:        Intake/Output Summary (Last 24 hours) at 12/11/2018 1511 Last data filed at 12/11/2018 1419 Gross per 24 hour  Intake 1317 ml  Output 3950 ml  Net -2633 ml   Filed Weights   12/09/18 0443 12/10/18 0508 12/11/18 0832    Weight: 123.6 kg 124.1 kg 129 kg    Examination: General exam: Appears comfortable  HEENT: PERRLA, oral mucosa moist, no sclera icterus or thrush Respiratory system: Clear to auscultation. Respiratory effort normal. Cardiovascular system: S1 & S2 heard,  No murmurs  Gastrointestinal system: Abdomen soft, non-tender, nondistended. Normal bowel sound. No organomegaly Central nervous system: Alert and oriented. No focal neurological deficits. Extremities: No cyanosis, clubbing or edema Skin: No rashes or ulcers Psychiatry:  Mood & affect appropriate.   Data Reviewed: I have personally reviewed following labs and imaging studies  CBC: Recent Labs  Lab 12/07/18 0500 12/07/18 0715 12/08/18 0310 12/09/18 0529 12/10/18 0631 12/11/18 0617  WBC 21.4*  21.6* DUPL SEE [id number] 21.7* 15.2* 10.8* 8.5  NEUTROABS 18.4* PENDING  --   --   --   --   HGB 8.2*  8.2* DUPL SEE [id number] 7.8* 7.8* 7.6* 7.6*  HCT 28.4*  28.7* DUPL SEE [id number] 26.0* 26.2* 25.4* 24.7*  MCV 89.0  88.0 DUPL SEE [id number] 87.8 86.8 86.4 86.7  PLT 250  260 DUPL SEE [id number] 237 240 276 270   Basic Metabolic Panel: Recent Labs  Lab 12/07/18 0500 12/08/18 0310 12/09/18 0529 12/10/18 0631 12/11/18 0617  NA 140 140 139 139 139  K 3.6 3.6 3.4* 3.6 3.5  CL 96* 97* 96* 94* 96*  CO2 33* 32 33* 32 34*  GLUCOSE 245* 183* 169* 160* 221*  BUN 32* 30* 26* 28* 27*  CREATININE 1.40* 1.39* 1.46* 1.56* 1.48*  CALCIUM 8.0* 8.0* 7.9* 8.3* 7.9*   GFR: Estimated Creatinine Clearance: 64.5 mL/min (A) (by C-G formula based on SCr of 1.48 mg/dL (H)). Liver Function Tests: No results for input(s): AST, ALT, ALKPHOS, BILITOT, PROT, ALBUMIN in the last 168 hours. No results for input(s): LIPASE, AMYLASE in the last 168 hours. No results for input(s): AMMONIA in the last 168 hours. Coagulation Profile: No results for input(s): INR, PROTIME in the last 168 hours. Cardiac Enzymes: No results for input(s): CKTOTAL, CKMB, CKMBINDEX,  TROPONINI in the last 168 hours. BNP (last 3 results) No results for input(s): PROBNP in the last 8760 hours. HbA1C: No results for input(s): HGBA1C in the last 72 hours. CBG: Recent Labs  Lab 12/10/18 1239 12/10/18 1610 12/10/18 2135 12/11/18 0810 12/11/18 1238  GLUCAP 143* 167* 131* 174* 227*   Lipid Profile: No results for input(s): CHOL, HDL, LDLCALC, TRIG, CHOLHDL, LDLDIRECT in the last 72 hours. Thyroid Function Tests: No results for input(s): TSH, T4TOTAL, FREET4, T3FREE, THYROIDAB in the last 72 hours. Anemia Panel: No results for input(s): VITAMINB12, FOLATE, FERRITIN, TIBC, IRON, RETICCTPCT in the last 72 hours. Urine  analysis:    Component Value Date/Time   COLORURINE STRAW (A) 12/01/2018 0422   APPEARANCEUR CLEAR 12/01/2018 0422   LABSPEC 1.006 12/01/2018 0422   PHURINE 5.0 12/01/2018 0422   GLUCOSEU NEGATIVE 12/01/2018 0422   HGBUR MODERATE (A) 12/01/2018 0422   BILIRUBINUR NEGATIVE 12/01/2018 0422   KETONESUR NEGATIVE 12/01/2018 0422   PROTEINUR NEGATIVE 12/01/2018 0422   UROBILINOGEN 0.2 10/26/2014 2206   NITRITE NEGATIVE 12/01/2018 0422   LEUKOCYTESUR NEGATIVE 12/01/2018 0422   Sepsis Labs: _0 (procalcitonin:4,lacticidven:4) ) Recent Results (from the past 240 hour(s))  Culture, blood (routine x 2)     Status: None   Collection Time: 12/01/18  4:00 PM  Result Value Ref Range Status   Specimen Description BLOOD RIGHT HAND  Final   Special Requests   Final    BOTTLES DRAWN AEROBIC ONLY Blood Culture adequate volume   Culture   Final    NO GROWTH 5 DAYS Performed at [facility], [place] [address], [place], [place] [address]    Report Status 12/06/2018 FINAL  Final  Culture, respiratory (non-expectorated)     Status: None   Collection Time: 12/01/18  4:03 PM  Result Value Ref Range Status   Specimen Description TRACHEAL ASPIRATE  Final   Special Requests NONE  Final   Gram Stain   Final    RARE WBC PRESENT, PREDOMINANTLY PMN RARE GRAM  POSITIVE COCCI RARE GRAM POSITIVE RODS Performed at [facility], [place] [address], [place], [place] [address]    Culture FEW Consistent with normal respiratory flora.  Final   Report Status 12/04/2018 FINAL  Final  Culture, blood (routine x 2)     Status: None   Collection Time: 12/01/18  4:10 PM  Result Value Ref Range Status   Specimen Description BLOOD RIGHT HAND  Final   Special Requests   Final    BOTTLES DRAWN AEROBIC ONLY Blood Culture results may not be optimal due to an inadequate volume of blood received in culture bottles   Culture   Final    NO GROWTH 5 DAYS Performed at [facility], [place] [address] [place], [place] [address]    Report Status 12/06/2018 FINAL  Final         Radiology Studies: No results found.    Scheduled Meds: . azithromycin  500 mg Oral Daily  . bisoprolol  5 mg Oral Daily  . cefdinir  300 mg Oral Q12H  . diazepam  10 mg Oral Daily  . furosemide  80 mg Intravenous BID  . gabapentin  300 mg Oral BID  . insulin aspart  0-15 Units Subcutaneous TID WC  . insulin aspart  5 Units Subcutaneous TID WC  . insulin glargine  15 Units Subcutaneous Daily  . ipratropium-albuterol  3 mL Nebulization TID  . isosorbide-hydrALAZINE  1 tablet Oral TID  . mouth rinse  15 mL Mouth Rinse BID  . mometasone-formoterol  2 puff Inhalation BID  . pantoprazole  40 mg Oral QHS  . potassium chloride  40 mEq Oral BID  . rivaroxaban  20 mg Oral Q supper  . rosuvastatin  20 mg Oral Daily  . sodium chloride flush  3 mL Intravenous Q12H   Continuous Infusions: . sodium chloride 250 mL (12/09/18 2102)     LOS: 10 days      Debbe Odea, MD [facility] Pager: [email or web address] Password [email or web address] 12/11/2018, 3:11 PM

## 2018-12-11 NOTE — Progress Notes (Signed)
Patient ID: Nicholas Caldwell, male   DOB: 14-Apr-1946, 73 y.o.   MRN: [id number]     Advanced Heart Failure Rounding Note  PCP-Cardiologist: No primary care provider on file.   Subjective:    Good diuresis with IV Lasix + metolazone again.  Only has bed weight.    Cr 1.46 -> 1.56 -> 1.48. Marland Kitchen  Breathing slowly improving.   Hgb 8.2 -> 7.8 -> 7.8 -> 7.6 -> 7.6.    RHC/LHC (2/3):  Coronary Findings   Diagnostic  Dominance: Co-dominant  Left Main  No significant disease.  Left Anterior Descending  50-60% distal LAD stenosis.  Ramus Intermedius  Moderate vessel, no significant disease.  Left Circumflex  50% proximal LCx stenosis. Large vessel, co-dominant.  Right Coronary Artery  Relatively small RCA. 50% proximal stenosis.  Intervention   No interventions have been documented.  Right Heart   Right Heart Pressures RHC Procedural Findings: Hemodynamics (mmHg) RA mean 10 RV 56/9 PA 52/16, mean 30 PCWP mean 10 LV 95/10 AO 94/48  Oxygen saturations: PA 65% AO 96%  Cardiac Output (Fick) 9.2  Cardiac Index (Fick) 3.85 PVR 2.2 WU     Objective:   Weight Range: 129 kg Body mass index is 38.57 kg/m.   Vital Signs:   Temp:  [97.4 F (36.3 C)-99.3 F (37.4 C)] 99.3 F (37.4 C) (02/08 0630) Pulse Rate:  [65-74] 65 (02/08 0630) Resp:  [18-20] 18 (02/08 0630) BP: (108-126)/(51-63) 108/61 (02/08 0630) SpO2:  [89 %-98 %] 98 % (02/08 0741) Weight:  [129 kg] 129 kg (02/08 0832) Last BM Date: 12/10/18  Weight change: Filed Weights   12/09/18 0443 12/10/18 0508 12/11/18 0832  Weight: 123.6 kg 124.1 kg 129 kg   Intake/Output:   Intake/Output Summary (Last 24 hours) at 12/11/2018 1009 Last data filed at 12/11/2018 0700 Gross per 24 hour  Intake 1077 ml  Output 3241 ml  Net -2164 ml    Physical Exam   General: NAD Neck: JVP 12 cm, no thyromegaly or thyroid nodule.  Lungs: Distant breath sounds with occasional rhonchi.  CV: Heart regular S1/S2, no S3/S4, no murmur.   1+ ankle edema.    Abdomen: Soft, nontender, no hepatosplenomegaly, no distention.  Skin: Intact without lesions or rashes.  Neurologic: Alert and oriented x 3.  Psych: Normal affect. Extremities: No clubbing or cyanosis.  HEENT: Normal.    Telemetry   NSR 70-80s, personally reviewed.   EKG   No new tracings.    Labs    CBC Recent Labs    12/10/18 0631 12/11/18 0617  WBC 10.8* 8.5  HGB 7.6* 7.6*  HCT 25.4* 24.7*  MCV 86.4 86.7  PLT 276 324   Basic Metabolic Panel Recent Labs    12/10/18 0631 12/11/18 0617  NA 139 139  K 3.6 3.5  CL 94* 96*  CO2 32 34*  GLUCOSE 160* 221*  BUN 28* 27*  CREATININE 1.56* 1.48*  CALCIUM 8.3* 7.9*   Liver Function Tests No results for input(s): AST, ALT, ALKPHOS, BILITOT, PROT, ALBUMIN in the last 72 hours. No results for input(s): LIPASE, AMYLASE in the last 72 hours. Cardiac Enzymes No results for input(s): CKTOTAL, CKMB, CKMBINDEX, TROPONINI in the last 72 hours.  BNP: BNP (last 3 results) Recent Labs    11/16/18 0237 11/21/18 2238 12/01/18 0015  BNP 606.1* 1,739.7* 1,666.7*    ProBNP (last 3 results) No results for input(s): PROBNP in the last 8760 hours.   D-Dimer No results for input(s): DDIMER in  the last 72 hours. Hemoglobin A1C No results for input(s): HGBA1C in the last 72 hours. Fasting Lipid Panel No results for input(s): CHOL, HDL, LDLCALC, TRIG, CHOLHDL, LDLDIRECT in the last 72 hours. Thyroid Function Tests No results for input(s): TSH, T4TOTAL, T3FREE, THYROIDAB in the last 72 hours.  Invalid input(s): FREET3  Other results:   Imaging    No results found.   Medications:     Scheduled Medications: . azithromycin  500 mg Oral Daily  . bisoprolol  5 mg Oral Daily  . cefdinir  300 mg Oral Q12H  . diazepam  10 mg Oral Daily  . furosemide  80 mg Intravenous BID  . gabapentin  300 mg Oral BID  . insulin aspart  0-15 Units Subcutaneous TID WC  . insulin aspart  5 Units Subcutaneous TID  WC  . insulin glargine  15 Units Subcutaneous Daily  . ipratropium-albuterol  3 mL Nebulization TID  . isosorbide-hydrALAZINE  1 tablet Oral TID  . mouth rinse  15 mL Mouth Rinse BID  . metolazone  2.5 mg Oral Once  . mometasone-formoterol  2 puff Inhalation BID  . pantoprazole  40 mg Oral QHS  . potassium chloride  40 mEq Oral BID  . rivaroxaban  20 mg Oral Q supper  . rosuvastatin  20 mg Oral Daily  . sodium chloride flush  3 mL Intravenous Q12H    Infusions: . sodium chloride 250 mL (12/09/18 2102)    PRN Medications: sodium chloride, acetaminophen, albuterol, docusate sodium, guaiFENesin-dextromethorphan, ondansetron (ZOFRAN) IV, oxyCODONE-acetaminophen, sodium chloride flush, sodium chloride flush    Patient Profile   Nicholas Caldwell is a 73 y.o. male with PMH of chronic systolic CHF/nonischemic cardiomyopathy Echo 12/13/2016 LVEF 40-45%, COPD on home oxygen, PAFs/p DCCV 2018,and history of non compliance.  Admitted overnight with acute respiratory failure, acute renal failure, and hypotension.   Assessment/Plan   1. Acute on chronic hypercarbic/hypoxemic respiratory failure: He is on home oxygen at baseline with COPD.  Patient was intubated at admission. Likely multifactorial.  He initially appeared volume overloaded on exam with pulmonary edema on CXR. Also concern for oversedation from home pain meds and COPD.  [place] 12/06/2018 showed filling pressures fairly well optimized.  CXR 12/07/18 showed L>R airspace disease; Edema vs infection.   - Will need eventual sleep study. Was scheduled for 11/30/2018, will need to reschedule. - Diuresis as below.  - Now on ABX. For ? PNA.  2. Acute on chronic systolic CHF: Echo in 4/01 with EF 40-45%, mild RV dilation.  Nonischemic cardiomyopathy.  He was admitted with hypotension and was initially on norepinephrine.  Pulmonary edema on initial CXR and volume overloaded on exam.  Also with AKI at admission. Echo 12/02/2018 LVEF stable at 40-45%.  RHC/LHC 2/3 with nonobstructive CAD, filling pressures near-optimized. Good cardiac output.  Volume status remains elevated on exam.  He is diuresing well with Lasix + metolazone.  - Repeat 80 mg IV lasix BID with dose of metolazone this am. He needs at least 1 more day aggressive diuresis based on symptoms and exam.  - Holding lisinopril and spironolactone with AKI.  - Continue Bidil 1 tab TID - Continue bisoprolol 5 mg daily.  3. AKI likely due to ATN: Baseline creatinine 1.0. Creatinine 3.48 on 12/01/18.  Today, creatinine lower at 1.48.  - Holding lisinopril and spironolactone with AKI, 4. Shock/hypotension: Presented with low BP, initially required norepinephrine. Unclear at this time what caused his presentation, may have been over-sedation with home pain  meds. Good cardiac output on RHC.  5. COPD: He is now off steroids.  CXR 12/07/18 showed L>R airspace disease; Edema vs infection.   - Cefdinir per primary service.  - He is on home oxygen at baseline.   6. Atrial flutter/fibrillation: Recurrent. Likely in setting of acute illness. Now rate controlled.  He has previously failed amiodarone due to markedly prolonged QT. Not tikosyn candidate for same reason. Now in sinus rhythm.  - Would consider atrial fibrillation ablation as outpatient.  - Continue Xarelto.   - Continue current bisoprolol.  7. Elevated troponin: Mild troponin elevation with no trend. Likely demand ischemia with volume overload/hypotension.  Coronary angiography showed nonobstructive CAD.  No chest pain.   - Continue statin.   8. Anemia: Slow down-trend in hemoglobin, 7.6 today. No overt bleeding but having dark stools.   - Further per primary.  Hgb stably low. Likely needs GI consultation at some point.  - Guaiac stool.  - Feraheme given.  - Watch CBC closely with Xarelto use.   9. Hypokalemia: Ordered for KCl 40 meq BID  Length of Stay: 10  Loralie Champagne, MD  12/11/2018, 10:09 AM  Advanced Heart Failure Team Pager  [phone] (M-F; [place])  Please contact [facility] for night-coverage after hours (4p -7a ) and weekends on [email or web address]]

## 2018-12-11 NOTE — Progress Notes (Addendum)
Gregary Signs and I tried twice to weigh patient but he refused. He said he wasn't going to get up just for that. Patient has been educated. Will do a bed weight.

## 2018-12-12 DIAGNOSIS — E875 Hyperkalemia: Secondary | ICD-10-CM

## 2018-12-12 DIAGNOSIS — E785 Hyperlipidemia, unspecified: Secondary | ICD-10-CM

## 2018-12-12 DIAGNOSIS — I428 Other cardiomyopathies: Secondary | ICD-10-CM

## 2018-12-12 DIAGNOSIS — E1165 Type 2 diabetes mellitus with hyperglycemia: Secondary | ICD-10-CM

## 2018-12-12 DIAGNOSIS — J9611 Chronic respiratory failure with hypoxia: Secondary | ICD-10-CM

## 2018-12-12 DIAGNOSIS — E118 Type 2 diabetes mellitus with unspecified complications: Secondary | ICD-10-CM

## 2018-12-12 DIAGNOSIS — E1169 Type 2 diabetes mellitus with other specified complication: Secondary | ICD-10-CM

## 2018-12-12 DIAGNOSIS — I2510 Atherosclerotic heart disease of native coronary artery without angina pectoris: Secondary | ICD-10-CM

## 2018-12-12 DIAGNOSIS — G4733 Obstructive sleep apnea (adult) (pediatric): Secondary | ICD-10-CM

## 2018-12-12 DIAGNOSIS — I10 Essential (primary) hypertension: Secondary | ICD-10-CM

## 2018-12-12 LAB — GLUCOSE, CAPILLARY: Glucose-Capillary: 160 mg/dL — ABNORMAL HIGH (ref 70–99)

## 2018-12-12 LAB — BASIC METABOLIC PANEL
Anion gap: 13 (ref 5–15)
BUN: 30 mg/dL — ABNORMAL HIGH (ref 8–23)
CHLORIDE: 93 mmol/L — AB (ref 98–111)
CO2: 33 mmol/L — ABNORMAL HIGH (ref 22–32)
Calcium: 8.1 mg/dL — ABNORMAL LOW (ref 8.9–10.3)
Creatinine, Ser: 1.5 mg/dL — ABNORMAL HIGH (ref 0.61–1.24)
GFR calc Af Amer: 54 mL/min — ABNORMAL LOW (ref >60)
GFR calc non Af Amer: 46 mL/min — ABNORMAL LOW (ref >60)
Glucose, Bld: 196 mg/dL — ABNORMAL HIGH (ref 70–99)
POTASSIUM: 3.6 mmol/L (ref 3.5–5.1)
Sodium: 139 mmol/L (ref 135–145)

## 2018-12-12 LAB — CBC
HCT: 28.9 % — ABNORMAL LOW (ref 39.0–52.0)
Hemoglobin: 8.6 g/dL — ABNORMAL LOW (ref 13.0–17.0)
MCH: 25.7 pg — ABNORMAL LOW (ref 26.0–34.0)
MCHC: 29.8 g/dL — ABNORMAL LOW (ref 30.0–36.0)
MCV: 86.3 fL (ref 80.0–100.0)
Platelets: 293 10*3/uL (ref 150–400)
RBC: 3.35 MIL/uL — ABNORMAL LOW (ref 4.22–5.81)
RDW: 18.4 % — ABNORMAL HIGH (ref 11.5–15.5)
WBC: 7.8 10*3/uL (ref 4.0–10.5)
nRBC: 0 % (ref 0.0–0.2)

## 2018-12-12 LAB — TYPE AND SCREEN
ABO/RH(D): B POS
Antibody Screen: NEGATIVE
Unit division: 0

## 2018-12-12 LAB — BPAM RBC
Blood Product Expiration Date: 202002252359
ISSUE DATE / TIME: 202002082112
UNIT TYPE AND RH: 7300

## 2018-12-12 MED ORDER — ISOSORB DINITRATE-HYDRALAZINE 20-37.5 MG PO TABS: 1.0000 | ORAL_TABLET | Freq: Three times a day (TID) | ORAL | 0 refills | Status: DC

## 2018-12-12 MED ORDER — CEFDINIR 300 MG PO CAPS: 300.0000 mg | ORAL_CAPSULE | Freq: Two times a day (BID) | ORAL | 0 refills | Status: DC

## 2018-12-12 MED ORDER — INSULIN ASPART 100 UNIT/ML ~~LOC~~ SOLN: 5.0000 [IU] | Freq: Three times a day (TID) | SUBCUTANEOUS | Status: DC

## 2018-12-12 MED ORDER — TORSEMIDE 20 MG PO TABS
80.0000 mg | ORAL_TABLET | Freq: Two times a day (BID) | ORAL | Status: DC
  Administered 2018-12-12: 80 mg via ORAL
  Filled 2018-12-12: qty 4

## 2018-12-12 MED ORDER — AZITHROMYCIN 500 MG PO TABS: 500.0000 mg | ORAL_TABLET | Freq: Every day | ORAL | 0 refills | Status: DC

## 2018-12-12 NOTE — Progress Notes (Signed)
Patient refused to get up and get on the scale. Will do a bed weight. Patient was educated about it but still refused.

## 2018-12-12 NOTE — Progress Notes (Signed)
Patient ID: Nicholas Caldwell, male   DOB: Apr 28, 1946, 73 y.o.   MRN: [id number]     Advanced Heart Failure Rounding Note  PCP-Cardiologist: No primary care provider on file.   Subjective:    Yesterday had IV Lasix + metolazone again.  Only has bed weight again.    Cr 1.46 -> 1.56 -> 1.48 -> 1.5.   Got 1 unit PRBCs yesterday, says he feels better with the blood.   Hgb 8.2 -> 7.8 -> 7.8 -> 7.6 -> 7.6 -> 1 unit PRBCs -> 8.6.    RHC/LHC (2/3):  Coronary Findings   Diagnostic  Dominance: Co-dominant  Left Main  No significant disease.  Left Anterior Descending  50-60% distal LAD stenosis.  Ramus Intermedius  Moderate vessel, no significant disease.  Left Circumflex  50% proximal LCx stenosis. Large vessel, co-dominant.  Right Coronary Artery  Relatively small RCA. 50% proximal stenosis.  Intervention   No interventions have been documented.  Right Heart   Right Heart Pressures RHC Procedural Findings: Hemodynamics (mmHg) RA mean 10 RV 56/9 PA 52/16, mean 30 PCWP mean 10 LV 95/10 AO 94/48  Oxygen saturations: PA 65% AO 96%  Cardiac Output (Fick) 9.2  Cardiac Index (Fick) 3.85 PVR 2.2 WU     Objective:   Weight Range: 122.8 kg Body mass index is 36.72 kg/m.   Vital Signs:   Temp:  [98.2 F (36.8 C)-99.9 F (37.7 C)] 98.7 F (37.1 C) (02/09 0608) Pulse Rate:  [69-81] 69 (02/09 0608) Resp:  [18-20] 20 (02/09 0608) BP: (97-115)/(45-69) 99/57 (02/09 0608) SpO2:  [96 %-99 %] 98 % (02/09 0725) Weight:  [122.8 kg] 122.8 kg (02/09 0818) Last BM Date: 12/11/18  Weight change: Filed Weights   12/11/18 0832 12/12/18 0818  Weight: 129 kg 122.8 kg   Intake/Output:   Intake/Output Summary (Last 24 hours) at 12/12/2018 0836 Last data filed at 12/12/2018 0103 Gross per 24 hour  Intake 2258.74 ml  Output 2075 ml  Net 183.74 ml    Physical Exam   General: NAD Neck: JVP 8 cm, no thyromegaly or thyroid nodule.  Lungs: Occasional rhonchi/coarse BS CV:  Nondisplaced PMI.  Heart regular S1/S2, no S3/S4, no murmur.  No peripheral edema.   Abdomen: Soft, nontender, no hepatosplenomegaly, no distention.  Skin: Intact without lesions or rashes.  Neurologic: Alert and oriented x 3.  Psych: Normal affect. Extremities: No clubbing or cyanosis.  HEENT: Normal.    Telemetry   NSR 70s, personally reviewed.   EKG   No new tracings.    Labs    CBC Recent Labs    12/11/18 0617 12/12/18 0459  WBC 8.5 7.8  HGB 7.6* 8.6*  HCT 24.7* 28.9*  MCV 86.7 86.3  PLT 266 563   Basic Metabolic Panel Recent Labs    12/11/18 0617 12/12/18 0459  NA 139 139  K 3.5 3.6  CL 96* 93*  CO2 34* 33*  GLUCOSE 221* 196*  BUN 27* 30*  CREATININE 1.48* 1.50*  CALCIUM 7.9* 8.1*   Liver Function Tests No results for input(s): AST, ALT, ALKPHOS, BILITOT, PROT, ALBUMIN in the last 72 hours. No results for input(s): LIPASE, AMYLASE in the last 72 hours. Cardiac Enzymes No results for input(s): CKTOTAL, CKMB, CKMBINDEX, TROPONINI in the last 72 hours.  BNP: BNP (last 3 results) Recent Labs    11/16/18 0237 11/21/18 2238 12/01/18 0015  BNP 606.1* 1,739.7* 1,666.7*    ProBNP (last 3 results) No results for input(s): PROBNP in the last  8760 hours.   D-Dimer No results for input(s): DDIMER in the last 72 hours. Hemoglobin A1C No results for input(s): HGBA1C in the last 72 hours. Fasting Lipid Panel No results for input(s): CHOL, HDL, LDLCALC, TRIG, CHOLHDL, LDLDIRECT in the last 72 hours. Thyroid Function Tests No results for input(s): TSH, T4TOTAL, T3FREE, THYROIDAB in the last 72 hours.  Invalid input(s): FREET3  Other results:   Imaging    No results found.   Medications:     Scheduled Medications: . azithromycin  500 mg Oral Daily  . bisoprolol  5 mg Oral Daily  . cefdinir  300 mg Oral Q12H  . diazepam  10 mg Oral Daily  . gabapentin  300 mg Oral BID  . insulin aspart  0-15 Units Subcutaneous TID WC  . insulin aspart  5  Units Subcutaneous TID WC  . insulin glargine  15 Units Subcutaneous Daily  . ipratropium-albuterol  3 mL Nebulization TID  . isosorbide-hydrALAZINE  1 tablet Oral TID  . mouth rinse  15 mL Mouth Rinse BID  . mometasone-formoterol  2 puff Inhalation BID  . pantoprazole  40 mg Oral QHS  . potassium chloride  40 mEq Oral BID  . rivaroxaban  20 mg Oral Q supper  . rosuvastatin  20 mg Oral Daily  . sodium chloride flush  3 mL Intravenous Q12H  . torsemide  80 mg Oral BID    Infusions: . sodium chloride Stopped (12/12/18 0051)    PRN Medications: sodium chloride, acetaminophen, albuterol, docusate sodium, guaiFENesin-dextromethorphan, ondansetron (ZOFRAN) IV, oxyCODONE-acetaminophen, sodium chloride flush, sodium chloride flush    Patient Profile   Nicholas Caldwell is a 73 y.o. male with PMH of chronic systolic CHF/nonischemic cardiomyopathy Echo 12/13/2016 LVEF 40-45%, COPD on home oxygen, PAFs/p DCCV 2018,and history of non compliance.  Admitted overnight with acute respiratory failure, acute renal failure, and hypotension.   Assessment/Plan   1. Acute on chronic hypercarbic/hypoxemic respiratory failure: He is on home oxygen at baseline with COPD.  Patient was intubated at admission. Likely multifactorial.  He initially appeared volume overloaded on exam with pulmonary edema on CXR. Also concern for oversedation from home pain meds and COPD.  [place] 12/06/2018 showed filling pressures fairly well optimized.  CXR 12/07/18 showed L>R airspace disease; Edema vs infection.   - Will need eventual sleep study. Was scheduled for 11/30/2018, will need to reschedule. - Diuresis as below.  - Now on ABX for ? PNA versus COPD exacerbation.  2. Acute on chronic systolic CHF: Echo in 4/26 with EF 40-45%, mild RV dilation.  Nonischemic cardiomyopathy.  He was admitted with hypotension and was initially on norepinephrine.  Pulmonary edema on initial CXR and volume overloaded on exam.  Also with AKI at  admission. Echo 12/02/2018 LVEF stable at 40-45%. RHC/LHC 2/3 with nonobstructive CAD, filling pressures near-optimized. Good cardiac output.  Volume status improved on exam, refuses to stand up for weights.  Creatinine stable at 1.5.   - I will transition him to torsemide 80 mg po bid today.  - Holding lisinopril and spironolactone with AKI.  - Continue Bidil 1 tab TID - Continue bisoprolol 5 mg daily.  3. AKI likely due to ATN: Baseline creatinine 1.0. Creatinine 3.48 on 12/01/18.  Today, creatinine stable at 1.5.  - Holding lisinopril and spironolactone with AKI, 4. Shock/hypotension: Presented with low BP, initially required norepinephrine. Unclear at this time what caused his presentation, may have been over-sedation with home pain meds. Good cardiac output on RHC.  5.  COPD: COPD exacerbation versus PNA. He is now off steroids.  CXR 12/07/18 showed L>R airspace disease; Edema vs infection.   - Cefdinir per primary service.  - He is on home oxygen at baseline.   6. Atrial flutter/fibrillation: Recurrent. Likely in setting of acute illness. Now rate controlled.  He has previously failed amiodarone due to markedly prolonged QT. Not tikosyn candidate for same reason. Now in sinus rhythm.  - Would consider atrial fibrillation ablation as outpatient.  - Continue Xarelto.   - Continue current bisoprolol.  7. Elevated troponin: Mild troponin elevation with no trend. Likely demand ischemia with volume overload/hypotension.  Coronary angiography showed nonobstructive CAD.  No chest pain.   - Continue statin.   8. Anemia: Slow down-trend in hemoglobin. No overt bleeding but having dark stools.  Had 1 unit PRBCs 2/8 with hgb up to 8.6.  - Will need eventual GI consultation but still have not had stool guaiac.    - Feraheme given.  9. Hypokalemia: Ordered for KCl 40 meq BID  Length of Stay: 11  Loralie Champagne, MD  12/12/2018, 8:36 AM  Advanced Heart Failure Team Pager [phone] (M-F; 7a - 4p)  Please  contact [facility] for night-coverage after hours (4p -7a ) and weekends on [email or web address]]

## 2018-12-12 NOTE — Discharge Instructions (Signed)
Your Lisinopril has been replaced with Bidil. Do not take your Spironolactone unless your cardiologist tells you to resume it.  Your PCP will need to order Stool hemoccults to determine if you have a slow GI bleed.   You were cared for by a hospitalist during your hospital stay. If you have any questions about your discharge medications or the care you received while you were in the hospital after you are discharged, you can call the unit and asked to speak with the hospitalist on call if the hospitalist that took care of you is not available. Once you are discharged, your primary care physician will handle any further medical issues.   Please note that NO REFILLS for any discharge medications will be authorized once you are discharged, as it is imperative that you return to your primary care physician (or establish a relationship with a primary care physician if you do not have one) for your aftercare needs so that they can reassess your need for medications and monitor your lab values.  Please take all your medications with you for your next visit with your Primary MD. Please ask your Primary MD to get all Hospital records sent to his/her office. Please request your Primary MD to go over all hospital test results at the follow up.   If you experience worsening of your admission symptoms, develop shortness of breath, chest pain, suicidal or homicidal thoughts or a life threatening emergency, you must seek medical attention immediately by calling 911 or calling your MD.   Dennis Bast must read the complete instructions/literature along with all the possible adverse reactions/side effects for all the medicines you take including new medications that have been prescribed to you. Take new medicines after you have completely understood and accpet all the possible adverse reactions/side effects.    Do not drive when taking pain medications or sedatives.     Do not take more than prescribed Pain, Sleep and  Anxiety Medications   If you have smoked or chewed Tobacco in the last 2 yrs please stop. Stop any regular alcohol  and or recreational drug use.   Wear Seat belts while driving.

## 2018-12-12 NOTE — Progress Notes (Signed)
Spoke w patient he confirms he is active w [facility]. Notified liaison for resumption of services. He states he will have tank for transport in car and will just need tank when wheeled out at DC. No other CM needs

## 2018-12-12 NOTE — Progress Notes (Signed)
Discharge instructions given to patient, all questions answered and concerns addressed.

## 2018-12-12 NOTE — Progress Notes (Signed)
Patient refused morning weight.

## 2018-12-12 NOTE — Discharge Summary (Signed)
Physician Discharge Summary  Nicholas Caldwell BPZ:[id number] DOB: Jan 02, 1946 DOA: 11/30/2018  PCP: [facility]  Admit date: 11/30/2018 Discharge date: 12/12/2018  Admitted From: home Disposition:  home   Recommendations for Outpatient Follow-up:  1. F/u stool hemoccults-  2. Needs Bmet in 7-10 days 3. Needs to be encouraged to lose weight  Discharge Condition:  stable   CODE STATUS:  Full code   Consultations:  cardiology    Discharge Diagnoses:  Principal Problem:   Acute respiratory failure with hypoxia and hypercapnia (HCC) Active Problems:   Essential hypertension   Type 2 diabetes mellitus with neuropathy   Hyperlipidemia    COPD exacerbation (HCC)   PAF (paroxysmal atrial fibrillation) (HCC)   Nonischemic cardiomyopathy (HCC)   CAD - Non-obstructive by LHC1/16   Acute on chronic combined systolic and diastolic CHF (congestive heart failure) (HCC)   Acute pulmonary edema (HCC)   AKI (acute kidney injury) ([place])      Brief Summary: Nicholas Caldwell is a 73 yo with hx of A fib, COPD, systolic CHF, and chronic pain reportedly took extra pain medications before going to bed. Afterward his wife was worried that he was getting too sleepy and shaking, and that his blood pressure might be too low. He was brought to the ER and intubated due to respiratory failure and airway protection. He was found to have hyperkalemia and acute renal failure. He takes valium, neurontin, and percocet at home. He was consulted for acute on chronic systolic heart failure. He was eventually extubated and transferred to [facility] 2/1. He underwent heart cath 2/3.  Hospital Course:  Principal Problem:   Acute respiratory failure with hypoxia and hypercapnia/ VDRF - multifactorial>  COPD exacerbation, acute CHF, possibly polypharmacy and undiagnosed OSA - steroids have been weaned off - CXR 2/4 > Bilateral asymmetric airspace disease, left greater than right could reflect asymmetric edema or  infection. - coughing up green brown sputum, WBC count 21.6 - started on treatment for possible pneumonia with Cefepime and Azithromycin on 2/4- leukocytosis improving- cough improving-   switched Cefepime to Cefdinir - - U strep antigen is negative- still has a mild cough but sputum no longer as green as before- WBC improved to 7.8 - for CHF > being diuresed by cardiology- see below - O2 weaned to 2 L O2 which is his baseline  Active Problems:   Nonischemic cardiomyopathy-   Essential hypertension   CAD - Non-obstructive by LHC1/16   Acute on chronic combined systolic and diastolic CHF (congestive heart failure)  - - heart failure team following and assisting with management- diuresed with IV lasix- in negative balance by 11 L now - IV Lasix transitioned to Demadex 80 mg BID today - Lisinopril replaced by Bidil- per Dr [name],  will need to continue to hold Aldactone for now - cont Zebetal    PAF (paroxysmal atrial fibrillation) - cont Xarelto and Bisoprolol  Anemia - Hb drifted down to 7.6 on 2/8 and thus he was transfused 1 U PRBC - Hb has been anywhere from 8-11 in the past month - received Feraheme 2/4- continue oral Iron  - needs outpt hemoccults    AKI (acute kidney injury)   - suspected to be due to ATN-- elevated to 3.48 on 12/01/18-  baseline Cr 1.0   - follow while diuresing - Cr 1.50 today- cardiology has been holding Lisinopril and Aldactone as mentioned above    Type 2 diabetes mellitus with neuropathy -  last A1c 6.5 on 11/13/18-  cont home insulin and Metformin   Morbid obesity Body mass index is 36.72 kg/m. - needs to lose weight   Discharge Exam: Vitals:   12/12/18 0608 12/12/18 0725  BP: (!) 99/57   Pulse: 69   Resp: 20   Temp: 98.7 F (37.1 C)   SpO2: 97% 98%   Vitals:   12/12/18 0057 12/12/18 0608 12/12/18 0725 12/12/18 0818  BP: (!) 115/45 (!) 99/57    Pulse: 77 69    Resp: 18 20    Temp: 99.9 F (37.7 C) 98.7 F (37.1 C)    TempSrc:  Oral Oral    SpO2: 97% 97% 98%   Weight:    122.8 kg  Height:        General: Pt is alert, awake, not in acute distress Cardiovascular: RRR, S1/S2 +, no rubs, no gallops Respiratory: CTA bilaterally, no wheezing, no rhonchi Abdominal: Soft, NT, ND, bowel sounds + Extremities: no edema, no cyanosis   Discharge Instructions  Discharge Instructions    Diet - low sodium heart healthy   Complete by:  As directed    Diet Carb Modified   Complete by:  As directed    Increase activity slowly   Complete by:  As directed      Allergies as of 12/12/2018   No Known Allergies     Medication List    STOP taking these medications   lisinopril 10 MG tablet Commonly known as:  PRINIVIL,ZESTRIL   predniSONE 50 MG tablet Commonly known as:  DELTASONE   spironolactone 25 MG tablet Commonly known as:  ALDACTONE     TAKE these medications   azithromycin 500 MG tablet Commonly known as:  ZITHROMAX Take 1 tablet (500 mg total) by mouth daily. Start taking on:  December 13, 2018   bisoprolol 5 MG tablet Commonly known as:  ZEBETA Take 0.5 tablets (2.5 mg total) by mouth daily for 30 days.   budesonide-formoterol 160-4.5 MCG/ACT inhaler Commonly known as:  SYMBICORT Inhale 2 puffs into the lungs 2 (two) times daily.   cefdinir 300 MG capsule Commonly known as:  OMNICEF Take 1 capsule (300 mg total) by mouth every 12 (twelve) hours.   diazepam 10 MG tablet Commonly known as:  VALIUM Take 0.5 tablets (5 mg total) by mouth daily as needed for anxiety or sleep. What changed:  how much to take   ferrous sulfate 325 (65 FE) MG tablet Take 325 mg by mouth 3 (three) times daily as needed (for supplementation).   fluticasone 50 MCG/ACT nasal spray Commonly known as:  FLONASE Place 2 sprays into both nostrils daily as needed for allergies.   folic acid 1 MG tablet Commonly known as:  FOLVITE Take 1 mg by mouth daily.   gabapentin 600 MG tablet Commonly known as:  NEURONTIN Take  1 tablet (600 mg total) by mouth 2 (two) times daily.   glucose 4 GM chewable tablet Chew 4 tablets by mouth See admin instructions. Chew 4 tablets by mouth as needed for low blood sugar - repeat every 15 minutes if blood sugar less than 70   guaifenesin 400 MG Tabs tablet Commonly known as:  HUMIBID E Take 1 tablet (400 mg total) by mouth every 6 (six) hours as needed. What changed:  reasons to take this   hydrocortisone 1 % lotion Apply 1 application topically See admin instructions. Apply small amount to back twice daily for itchy rash   hydrocortisone 2.5 % rectal cream Commonly known as:  ANUSOL-HC Place 1 application rectally 2 (two) times daily as needed for hemorrhoids or itching.   insulin aspart protamine - aspart (70-30) 100 UNIT/ML FlexPen Commonly known as:  NOVOLOG MIX 70/30 FLEXPEN Inject 0.05 mLs (5 Units total) into the skin 2 (two) times daily. What changed:  how much to take   ipratropium-albuterol 0.5-2.5 (3) MG/3ML Soln Commonly known as:  DUONEB Take 3 mLs by nebulization 2 (two) times daily.   isosorbide-hydrALAZINE 20-37.5 MG tablet Commonly known as:  BIDIL Take 1 tablet by mouth 3 (three) times daily.   latanoprost 0.005 % ophthalmic solution Commonly known as:  XALATAN Place 1 drop into both eyes at bedtime.   Magnesium Oxide 420 (252 Mg) MG Tabs Take 420 mg by mouth 2 (two) times daily.   metFORMIN 500 MG 24 hr tablet Commonly known as:  GLUCOPHAGE-XR Take 1,000 mg by mouth 2 (two) times daily with a meal.   oxyCODONE-acetaminophen 10-325 MG tablet Commonly known as:  PERCOCET Take 1 tablet by mouth every 6 (six) hours as needed for pain.   OXYGEN Inhale 3 L into the lungs continuous.   potassium chloride SA 20 MEQ tablet Commonly known as:  K-DUR,KLOR-CON Take 2 tablets (40 mEq total) by mouth every morning AND 1 tablet (20 mEq total) at bedtime. What changed:  See the new instructions.   rivaroxaban 20 MG Tabs tablet Commonly known  as:  XARELTO Take 1 tablet (20 mg total) by mouth daily with supper. What changed:  when to take this   rosuvastatin 20 MG tablet Commonly known as:  CRESTOR Take 1 tablet (20 mg total) by mouth daily.   torsemide 20 MG tablet Commonly known as:  DEMADEX Take 4 tablets (80 mg total) by mouth 2 (two) times daily for 30 days.      Follow-up Information     [facility] Follow up on 12/21/2018.   Specialty:  Cardiology Why:  Heart Failure Follow up 12/21/18 @ 10 AM -Parking in ER lot (enter under blue awning to left of ER), or underneath [place] in the [place] on [address] (garage code:[id number] , Media planner to 1st floor).  -Take all am meds and bring all med bottles Contact information: [address] [id number] [place] [phone]       [facility] Follow up in 1 week(s).   Contact information: [place] [address] [phone]          No Known Allergies   Procedures/Studies: 2 D ECHO  1. The left ventricle has mild-moderately reduced systolic function of 53-97%. The cavity size is normal. There is moderate left ventricular wall thickness. Echo evidence of pseudonormal diastolic filling patterns. Indeterminent filling pressures.  2. Endocardial border definition is poor and views are often foreshortened. Function appears unchanged from prior.  3. Severely dilated left atrial size.  4. Severely dilated right atrial size.  5. The mitral valve normal in structure. Regurgitation is mild by color flow Doppler.  6. Normal tricuspid valve.  7. Tricuspid regurgitation is mild.  8. The aortic valve normal. Aortic valve regurgitation is mild by color flow Doppler.  9. Pulmonic valve regurgitation is mild by color flow Doppler. 10. No atrial level shunt detected by color flow Doppler.   Dg Chest 2 View  Result Date: 12/07/2018 CLINICAL DATA:   Fever, cough, shortness of breath EXAM: CHEST - 2 VIEW COMPARISON:  12/01/2018 FINDINGS: Right internal jugular central line tip is  in the right innominate vein, retracted since prior study. Cardiomegaly. Bilateral interstitial prominence with perihilar and lower lobe opacities on the right and diffuse airspace opacity on the left. Small bilateral pleural effusions, left greater than right. IMPRESSION: Retraction of the right PICC line with the tip now in the right innominate vein. Cardiomegaly. Bilateral asymmetric airspace disease, left greater than right could reflect asymmetric edema or infection. Small effusions. Electronically Signed   By: Rolm Baptise M.D.   On: 12/07/2018 10:00   US Renal  Result Date: 12/01/2018 CLINICAL DATA:  Acute renal failure EXAM: RENAL / URINARY TRACT ULTRASOUND COMPLETE COMPARISON:  None. FINDINGS: Right Kidney: Renal measurements: 11.1 x 5.1 x 5.8 cm = volume: 172 mL . Echogenicity within normal limits. No mass or hydronephrosis visualized. Left Kidney: Not well visualized. Bladder: Decompressed by Foley catheter. IMPRESSION: Right kidney is within normal limits. The left kidney is not visualized. Correlation to any prior surgical excision is recommended. Electronically Signed   By: Inez Catalina M.D.   On: 12/01/2018 16:14   Dg Chest Port 1 View  Result Date: 12/01/2018 CLINICAL DATA:  Respiratory failure.  New central venous line. EXAM: PORTABLE CHEST 1 VIEW COMPARISON:  Chest radiograph 12/01/2018 at 0114 hours FINDINGS: New RIGHT subclavian central venous catheter with distal tip projecting distal superior vena cava. Endotracheal tube tip projects 3 cm above the carina. Nasogastric tube past GE junction region, out of field of view. Stable cardiomegaly. Pulmonary vascular congestion interstitial prominence with retrocardiac consolidation. Small LEFT pleural effusion. Unremarkable. IMPRESSION: New RIGHT subclavian central venous catheter distal tip projects in distal  superior vena cava. Endotracheal tube tip projects 3 cm above the carina. Nasogastric tube past GE junction region. No pneumothorax. Stable cardiomegaly and interstitial prominence most compatible pulmonary edema. Persistent retrocardiac consolidation and small LEFT pleural effusion. Electronically Signed   By: Elon Alas M.D.   On: 12/01/2018 19:39   Dg Chest Portable 1 View  Result Date: 12/01/2018 CLINICAL DATA:  Intubation and OG tube placement. EXAM: PORTABLE CHEST 1 VIEW COMPARISON:  Radiograph earlier this day FINDINGS: Endotracheal tube tip 5.5 cm from the carina. Enteric tube tip below the diaphragm, side-port not well visualized. Again seen cardiomegaly, vascular congestion and pulmonary edema. Retrocardiac opacity may be overlapping soft tissue attenuation or pleural effusion. IMPRESSION: 1. Endotracheal tube 5.5 cm from the carina. Enteric tube tip below the diaphragm, side-port not well visualized. 2. Cardiomegaly, vascular congestion and pulmonary edema. Retrocardiac opacity may be overlapping soft tissue attenuation or pleural effusion. Electronically Signed   By: Keith Rake M.D.   On: 12/01/2018 01:27   Dg Chest Portable 1 View  Result Date: 12/01/2018 CLINICAL DATA:  Shortness of breath EXAM: PORTABLE CHEST 1 VIEW COMPARISON:  11/21/2018, 11/14/2018, 11/21/2018 FINDINGS: Non inclusion of the left base. Cardiomegaly with vascular congestion and moderate pulmonary edema. Suspected left pleural effusion. No pneumothorax. IMPRESSION: Similar appearance of cardiomegaly, vascular congestion, and moderate pulmonary edema with probable left pleural effusion Electronically Signed   By: Donavan Foil M.D.   On: 12/01/2018 00:34   Dg Chest Portable 1 View  Result Date: 11/21/2018 CLINICAL DATA:  sob, 89% on home 3L O2. Given 10 albuterol, 1 Atrovent and 125 solumedrol. Now at 100% on neb. Wheezing and rhonci in all lobes. sob EXAM: PORTABLE CHEST 1 VIEW COMPARISON:  11/15/2010  FINDINGS: Enlarged cardiac silhouette. LEFT basilar atelectasis and effusion. Fine airspace disease. No interval change. IMPRESSION: No interval change in congestive heart failure pattern. Electronically Signed  By: Suzy Bouchard M.D.   On: 11/21/2018 23:16   Dg Chest Port 1 View  Result Date: 11/15/2018 CLINICAL DATA:  Pulmonary edema. EXAM: PORTABLE CHEST 1 VIEW COMPARISON:  11/14/2018.  11/12/2018. FINDINGS: Cardiomegaly with persistent bilateral pulmonary infiltrates/edema. Left base atelectasis. Left-sided pleural effusion. No pneumothorax. IMPRESSION: Cardiomegaly with persistent bilateral pulmonary infiltrates/edema. Left base atelectasis. Left-sided pleural effusion. Similar findings noted on prior exams. Electronically Signed   By: Marcello Moores  Register   On: 11/15/2018 07:14   Dg Chest Port 1 View  Result Date: 11/14/2018 CLINICAL DATA:  Pulmonary edema EXAM: PORTABLE CHEST 1 VIEW COMPARISON:  Chest radiograph 11/12/2018 FINDINGS: Monitoring leads overlie the patient. Stable cardiomegaly. Diffuse bilateral interstitial pulmonary opacities with persistent consolidation within the left mid and lower lung. Small to moderate left pleural effusion. No pneumothorax. IMPRESSION: Persistent consolidation within the left mid and lower lung which may represent or focal edema underlying a left effusion or potentially pneumonia. Diffuse bilateral interstitial opacities favored to represent edema. Cardiomegaly. Electronically Signed   By: Lovey Newcomer M.D.   On: 11/14/2018 07:43   Dg Chest Port 1 View  Result Date: 11/12/2018 CLINICAL DATA:  Shortness of breath EXAM: PORTABLE CHEST 1 VIEW COMPARISON:  10/26/2018 FINDINGS: Cardiomegaly with vascular congestion and moderate pulmonary edema. There are small pleural effusions. Aortic atherosclerosis. No pneumothorax. Persistent left lung base opacity. IMPRESSION: Cardiomegaly with vascular congestion and moderate diffuse interstitial opacities suspicious for  pulmonary edema. More confluent hazy lung base opacities may reflect atelectasis or superimposed pneumonia. There are small pleural effusions. Electronically Signed   By: Donavan Foil M.D.   On: 11/12/2018 23:10     The results of significant diagnostics from this hospitalization (including imaging, microbiology, ancillary and laboratory) are listed below for reference.     Microbiology: No results found for this or any previous visit (from the past 240 hour(s)).   Labs: BNP (last 3 results) Recent Labs    11/16/18 0237 11/21/18 2238 12/01/18 0015  BNP 606.1* 1,739.7* 5,093.2*   Basic Metabolic Panel: Recent Labs  Lab 12/08/18 0310 12/09/18 0529 12/10/18 0631 12/11/18 0617 12/12/18 0459  NA 140 139 139 139 139  K 3.6 3.4* 3.6 3.5 3.6  CL 97* 96* 94* 96* 93*  CO2 32 33* 32 34* 33*  GLUCOSE 183* 169* 160* 221* 196*  BUN 30* 26* 28* 27* 30*  CREATININE 1.39* 1.46* 1.56* 1.48* 1.50*  CALCIUM 8.0* 7.9* 8.3* 7.9* 8.1*   Liver Function Tests: No results for input(s): AST, ALT, ALKPHOS, BILITOT, PROT, ALBUMIN in the last 168 hours. No results for input(s): LIPASE, AMYLASE in the last 168 hours. No results for input(s): AMMONIA in the last 168 hours. CBC: Recent Labs  Lab 12/07/18 0500 12/07/18 0715 12/08/18 0310 12/09/18 0529 12/10/18 0631 12/11/18 0617 12/12/18 0459  WBC 21.4*  21.6* DUPL SEE [id number] 21.7* 15.2* 10.8* 8.5 7.8  NEUTROABS 18.4* PENDING  --   --   --   --   --   HGB 8.2*  8.2* DUPL SEE [id number] 7.8* 7.8* 7.6* 7.6* 8.6*  HCT 28.4*  28.7* DUPL SEE [id number] 26.0* 26.2* 25.4* 24.7* 28.9*  MCV 89.0  88.0 DUPL SEE [id number] 87.8 86.8 86.4 86.7 86.3  PLT 250  260 DUPL SEE [id number] 237 240 276 266 293   Cardiac Enzymes: No results for input(s): CKTOTAL, CKMB, CKMBINDEX, TROPONINI in the last 168 hours. BNP: Invalid input(s): POCBNP CBG: Recent Labs  Lab 12/11/18 0810 12/11/18 1238 12/11/18 1702 12/11/18 2148 12/12/18 6712  GLUCAP 174* 227* 131* 162* 160*    D-Dimer No results for input(s): DDIMER in the last 72 hours. Hgb A1c No results for input(s): HGBA1C in the last 72 hours. Lipid Profile No results for input(s): CHOL, HDL, LDLCALC, TRIG, CHOLHDL, LDLDIRECT in the last 72 hours. Thyroid function studies No results for input(s): TSH, T4TOTAL, T3FREE, THYROIDAB in the last 72 hours.  Invalid input(s): FREET3 Anemia work up No results for input(s): VITAMINB12, FOLATE, FERRITIN, TIBC, IRON, RETICCTPCT in the last 72 hours. Urinalysis    Component Value Date/Time   COLORURINE STRAW (A) 12/01/2018 0422   APPEARANCEUR CLEAR 12/01/2018 0422   LABSPEC 1.006 12/01/2018 0422   PHURINE 5.0 12/01/2018 0422   GLUCOSEU NEGATIVE 12/01/2018 0422   HGBUR MODERATE (A) 12/01/2018 0422   BILIRUBINUR NEGATIVE 12/01/2018 0422   KETONESUR NEGATIVE 12/01/2018 0422   PROTEINUR NEGATIVE 12/01/2018 0422   UROBILINOGEN 0.2 10/26/2014 2206   NITRITE NEGATIVE 12/01/2018 0422   LEUKOCYTESUR NEGATIVE 12/01/2018 0422   Sepsis Labs Invalid input(s): PROCALCITONIN,  WBC,  LACTICIDVEN Microbiology No results found for this or any previous visit (from the past 240 hour(s)).   Time coordinating discharge in minutes: 65  SIGNED:   Debbe Odea, MD  [facility] 12/12/2018, 10:55 AM Pager   If 7PM-7AM, please contact night-coverage [email or web address] Password [email or web address]

## 2018-12-13 DIAGNOSIS — J9621 Acute and chronic respiratory failure with hypoxia: Secondary | ICD-10-CM | POA: Diagnosis not present

## 2018-12-13 DIAGNOSIS — I2510 Atherosclerotic heart disease of native coronary artery without angina pectoris: Secondary | ICD-10-CM | POA: Diagnosis not present

## 2018-12-13 DIAGNOSIS — J441 Chronic obstructive pulmonary disease with (acute) exacerbation: Secondary | ICD-10-CM | POA: Diagnosis not present

## 2018-12-13 DIAGNOSIS — I110 Hypertensive heart disease with heart failure: Secondary | ICD-10-CM | POA: Diagnosis not present

## 2018-12-13 DIAGNOSIS — I480 Paroxysmal atrial fibrillation: Secondary | ICD-10-CM | POA: Diagnosis not present

## 2018-12-13 DIAGNOSIS — I5023 Acute on chronic systolic (congestive) heart failure: Secondary | ICD-10-CM | POA: Diagnosis not present

## 2018-12-17 ENCOUNTER — Other Ambulatory Visit (HOSPITAL_COMMUNITY): Payer: Self-pay | Admitting: *Deleted

## 2018-12-20 DIAGNOSIS — J441 Chronic obstructive pulmonary disease with (acute) exacerbation: Secondary | ICD-10-CM | POA: Diagnosis not present

## 2018-12-20 DIAGNOSIS — I480 Paroxysmal atrial fibrillation: Secondary | ICD-10-CM | POA: Diagnosis not present

## 2018-12-20 DIAGNOSIS — I110 Hypertensive heart disease with heart failure: Secondary | ICD-10-CM | POA: Diagnosis not present

## 2018-12-20 DIAGNOSIS — I2510 Atherosclerotic heart disease of native coronary artery without angina pectoris: Secondary | ICD-10-CM | POA: Diagnosis not present

## 2018-12-20 DIAGNOSIS — I5023 Acute on chronic systolic (congestive) heart failure: Secondary | ICD-10-CM | POA: Diagnosis not present

## 2018-12-20 DIAGNOSIS — J9621 Acute and chronic respiratory failure with hypoxia: Secondary | ICD-10-CM | POA: Diagnosis not present

## 2018-12-21 ENCOUNTER — Encounter (HOSPITAL_COMMUNITY): Payer: Medicare Other

## 2018-12-30 ENCOUNTER — Other Ambulatory Visit: Payer: Self-pay

## 2018-12-30 ENCOUNTER — Emergency Department (HOSPITAL_COMMUNITY): Payer: Medicare Other

## 2018-12-30 ENCOUNTER — Inpatient Hospital Stay (HOSPITAL_COMMUNITY)
Admission: EM | Admit: 2018-12-30 | Discharge: 2019-01-14 | DRG: 291 | Disposition: A | Discharge status: Rehabilitation Facility | Payer: Medicare Other | Attending: Internal Medicine | Admitting: Internal Medicine

## 2018-12-30 ENCOUNTER — Other Ambulatory Visit (HOSPITAL_COMMUNITY): Payer: Self-pay

## 2018-12-30 DIAGNOSIS — J189 Pneumonia, unspecified organism: Secondary | ICD-10-CM | POA: Diagnosis present

## 2018-12-30 DIAGNOSIS — I2781 Cor pulmonale (chronic): Secondary | ICD-10-CM | POA: Diagnosis present

## 2018-12-30 DIAGNOSIS — Z978 Presence of other specified devices: Secondary | ICD-10-CM

## 2018-12-30 DIAGNOSIS — E873 Alkalosis: Secondary | ICD-10-CM | POA: Diagnosis not present

## 2018-12-30 DIAGNOSIS — J9621 Acute and chronic respiratory failure with hypoxia: Secondary | ICD-10-CM | POA: Diagnosis not present

## 2018-12-30 DIAGNOSIS — E1142 Type 2 diabetes mellitus with diabetic polyneuropathy: Secondary | ICD-10-CM | POA: Diagnosis not present

## 2018-12-30 DIAGNOSIS — Z9289 Personal history of other medical treatment: Secondary | ICD-10-CM

## 2018-12-30 DIAGNOSIS — J9690 Respiratory failure, unspecified, unspecified whether with hypoxia or hypercapnia: Secondary | ICD-10-CM | POA: Diagnosis not present

## 2018-12-30 DIAGNOSIS — Z9081 Acquired absence of spleen: Secondary | ICD-10-CM

## 2018-12-30 DIAGNOSIS — Z87891 Personal history of nicotine dependence: Secondary | ICD-10-CM

## 2018-12-30 DIAGNOSIS — Z9981 Dependence on supplemental oxygen: Secondary | ICD-10-CM | POA: Diagnosis not present

## 2018-12-30 DIAGNOSIS — I480 Paroxysmal atrial fibrillation: Secondary | ICD-10-CM | POA: Diagnosis not present

## 2018-12-30 DIAGNOSIS — Z7951 Long term (current) use of inhaled steroids: Secondary | ICD-10-CM

## 2018-12-30 DIAGNOSIS — M549 Dorsalgia, unspecified: Secondary | ICD-10-CM | POA: Diagnosis present

## 2018-12-30 DIAGNOSIS — R32 Unspecified urinary incontinence: Secondary | ICD-10-CM | POA: Diagnosis not present

## 2018-12-30 DIAGNOSIS — Z794 Long term (current) use of insulin: Secondary | ICD-10-CM

## 2018-12-30 DIAGNOSIS — Z7952 Long term (current) use of systemic steroids: Secondary | ICD-10-CM

## 2018-12-30 DIAGNOSIS — X58XXXA Exposure to other specified factors, initial encounter: Secondary | ICD-10-CM | POA: Diagnosis not present

## 2018-12-30 DIAGNOSIS — I4892 Unspecified atrial flutter: Secondary | ICD-10-CM | POA: Diagnosis present

## 2018-12-30 DIAGNOSIS — R5381 Other malaise: Secondary | ICD-10-CM | POA: Diagnosis present

## 2018-12-30 DIAGNOSIS — R188 Other ascites: Secondary | ICD-10-CM

## 2018-12-30 DIAGNOSIS — I4891 Unspecified atrial fibrillation: Secondary | ICD-10-CM | POA: Diagnosis not present

## 2018-12-30 DIAGNOSIS — I351 Nonrheumatic aortic (valve) insufficiency: Secondary | ICD-10-CM | POA: Diagnosis not present

## 2018-12-30 DIAGNOSIS — I130 Hypertensive heart and chronic kidney disease with heart failure and stage 1 through stage 4 chronic kidney disease, or unspecified chronic kidney disease: Secondary | ICD-10-CM | POA: Diagnosis not present

## 2018-12-30 DIAGNOSIS — R109 Unspecified abdominal pain: Secondary | ICD-10-CM | POA: Diagnosis not present

## 2018-12-30 DIAGNOSIS — G92 Toxic encephalopathy: Secondary | ICD-10-CM | POA: Diagnosis not present

## 2018-12-30 DIAGNOSIS — I2510 Atherosclerotic heart disease of native coronary artery without angina pectoris: Secondary | ICD-10-CM | POA: Diagnosis present

## 2018-12-30 DIAGNOSIS — Y95 Nosocomial condition: Secondary | ICD-10-CM | POA: Diagnosis present

## 2018-12-30 DIAGNOSIS — D6489 Other specified anemias: Secondary | ICD-10-CM | POA: Diagnosis present

## 2018-12-30 DIAGNOSIS — R570 Cardiogenic shock: Secondary | ICD-10-CM | POA: Diagnosis not present

## 2018-12-30 DIAGNOSIS — I5043 Acute on chronic combined systolic (congestive) and diastolic (congestive) heart failure: Secondary | ICD-10-CM | POA: Diagnosis present

## 2018-12-30 DIAGNOSIS — Z8249 Family history of ischemic heart disease and other diseases of the circulatory system: Secondary | ICD-10-CM

## 2018-12-30 DIAGNOSIS — R0902 Hypoxemia: Secondary | ICD-10-CM | POA: Diagnosis not present

## 2018-12-30 DIAGNOSIS — J9601 Acute respiratory failure with hypoxia: Secondary | ICD-10-CM | POA: Diagnosis present

## 2018-12-30 DIAGNOSIS — E1165 Type 2 diabetes mellitus with hyperglycemia: Secondary | ICD-10-CM | POA: Diagnosis not present

## 2018-12-30 DIAGNOSIS — I5023 Acute on chronic systolic (congestive) heart failure: Secondary | ICD-10-CM | POA: Diagnosis not present

## 2018-12-30 DIAGNOSIS — E1122 Type 2 diabetes mellitus with diabetic chronic kidney disease: Secondary | ICD-10-CM | POA: Diagnosis present

## 2018-12-30 DIAGNOSIS — E1140 Type 2 diabetes mellitus with diabetic neuropathy, unspecified: Secondary | ICD-10-CM | POA: Diagnosis not present

## 2018-12-30 DIAGNOSIS — E870 Hyperosmolality and hypernatremia: Secondary | ICD-10-CM | POA: Diagnosis not present

## 2018-12-30 DIAGNOSIS — L89311 Pressure ulcer of right buttock, stage 1: Secondary | ICD-10-CM | POA: Diagnosis present

## 2018-12-30 DIAGNOSIS — D649 Anemia, unspecified: Secondary | ICD-10-CM | POA: Diagnosis not present

## 2018-12-30 DIAGNOSIS — J9602 Acute respiratory failure with hypercapnia: Secondary | ICD-10-CM | POA: Diagnosis not present

## 2018-12-30 DIAGNOSIS — J449 Chronic obstructive pulmonary disease, unspecified: Secondary | ICD-10-CM | POA: Diagnosis present

## 2018-12-30 DIAGNOSIS — E662 Morbid (severe) obesity with alveolar hypoventilation: Secondary | ICD-10-CM | POA: Diagnosis present

## 2018-12-30 DIAGNOSIS — E278 Other specified disorders of adrenal gland: Secondary | ICD-10-CM | POA: Diagnosis present

## 2018-12-30 DIAGNOSIS — J9811 Atelectasis: Secondary | ICD-10-CM | POA: Diagnosis present

## 2018-12-30 DIAGNOSIS — I2729 Other secondary pulmonary hypertension: Secondary | ICD-10-CM | POA: Diagnosis present

## 2018-12-30 DIAGNOSIS — T380X5A Adverse effect of glucocorticoids and synthetic analogues, initial encounter: Secondary | ICD-10-CM | POA: Diagnosis not present

## 2018-12-30 DIAGNOSIS — E785 Hyperlipidemia, unspecified: Secondary | ICD-10-CM | POA: Diagnosis present

## 2018-12-30 DIAGNOSIS — R2689 Other abnormalities of gait and mobility: Secondary | ICD-10-CM | POA: Diagnosis not present

## 2018-12-30 DIAGNOSIS — D72828 Other elevated white blood cell count: Secondary | ICD-10-CM | POA: Diagnosis not present

## 2018-12-30 DIAGNOSIS — Z7901 Long term (current) use of anticoagulants: Secondary | ICD-10-CM | POA: Diagnosis not present

## 2018-12-30 DIAGNOSIS — R062 Wheezing: Secondary | ICD-10-CM | POA: Diagnosis not present

## 2018-12-30 DIAGNOSIS — E876 Hypokalemia: Secondary | ICD-10-CM | POA: Diagnosis not present

## 2018-12-30 DIAGNOSIS — J440 Chronic obstructive pulmonary disease with acute lower respiratory infection: Secondary | ICD-10-CM | POA: Diagnosis present

## 2018-12-30 DIAGNOSIS — J9622 Acute and chronic respiratory failure with hypercapnia: Secondary | ICD-10-CM | POA: Diagnosis present

## 2018-12-30 DIAGNOSIS — D631 Anemia in chronic kidney disease: Secondary | ICD-10-CM | POA: Diagnosis present

## 2018-12-30 DIAGNOSIS — K2970 Gastritis, unspecified, without bleeding: Secondary | ICD-10-CM | POA: Diagnosis present

## 2018-12-30 DIAGNOSIS — F419 Anxiety disorder, unspecified: Secondary | ICD-10-CM | POA: Diagnosis present

## 2018-12-30 DIAGNOSIS — Z6839 Body mass index (BMI) 39.0-39.9, adult: Secondary | ICD-10-CM

## 2018-12-30 DIAGNOSIS — E669 Obesity, unspecified: Secondary | ICD-10-CM | POA: Diagnosis not present

## 2018-12-30 DIAGNOSIS — I4510 Unspecified right bundle-branch block: Secondary | ICD-10-CM | POA: Diagnosis present

## 2018-12-30 DIAGNOSIS — D72829 Elevated white blood cell count, unspecified: Secondary | ICD-10-CM | POA: Diagnosis not present

## 2018-12-30 DIAGNOSIS — Z452 Encounter for adjustment and management of vascular access device: Secondary | ICD-10-CM | POA: Diagnosis not present

## 2018-12-30 DIAGNOSIS — Z79891 Long term (current) use of opiate analgesic: Secondary | ICD-10-CM

## 2018-12-30 DIAGNOSIS — D638 Anemia in other chronic diseases classified elsewhere: Secondary | ICD-10-CM | POA: Diagnosis present

## 2018-12-30 DIAGNOSIS — I509 Heart failure, unspecified: Secondary | ICD-10-CM

## 2018-12-30 DIAGNOSIS — J90 Pleural effusion, not elsewhere classified: Secondary | ICD-10-CM | POA: Diagnosis not present

## 2018-12-30 DIAGNOSIS — R918 Other nonspecific abnormal finding of lung field: Secondary | ICD-10-CM | POA: Diagnosis not present

## 2018-12-30 DIAGNOSIS — R0600 Dyspnea, unspecified: Secondary | ICD-10-CM | POA: Diagnosis not present

## 2018-12-30 DIAGNOSIS — I110 Hypertensive heart disease with heart failure: Secondary | ICD-10-CM | POA: Diagnosis present

## 2018-12-30 DIAGNOSIS — I361 Nonrheumatic tricuspid (valve) insufficiency: Secondary | ICD-10-CM | POA: Diagnosis not present

## 2018-12-30 DIAGNOSIS — I272 Pulmonary hypertension, unspecified: Secondary | ICD-10-CM | POA: Diagnosis present

## 2018-12-30 DIAGNOSIS — I428 Other cardiomyopathies: Secondary | ICD-10-CM

## 2018-12-30 DIAGNOSIS — I959 Hypotension, unspecified: Secondary | ICD-10-CM | POA: Diagnosis not present

## 2018-12-30 DIAGNOSIS — G8929 Other chronic pain: Secondary | ICD-10-CM | POA: Diagnosis present

## 2018-12-30 DIAGNOSIS — M1611 Unilateral primary osteoarthritis, right hip: Secondary | ICD-10-CM | POA: Diagnosis not present

## 2018-12-30 DIAGNOSIS — E7800 Pure hypercholesterolemia, unspecified: Secondary | ICD-10-CM | POA: Diagnosis present

## 2018-12-30 DIAGNOSIS — Z7984 Long term (current) use of oral hypoglycemic drugs: Secondary | ICD-10-CM | POA: Diagnosis not present

## 2018-12-30 DIAGNOSIS — Z781 Physical restraint status: Secondary | ICD-10-CM

## 2018-12-30 DIAGNOSIS — Z79899 Other long term (current) drug therapy: Secondary | ICD-10-CM

## 2018-12-30 DIAGNOSIS — J9620 Acute and chronic respiratory failure, unspecified whether with hypoxia or hypercapnia: Secondary | ICD-10-CM | POA: Diagnosis not present

## 2018-12-30 DIAGNOSIS — N183 Chronic kidney disease, stage 3 (moderate): Secondary | ICD-10-CM | POA: Diagnosis present

## 2018-12-30 DIAGNOSIS — E1169 Type 2 diabetes mellitus with other specified complication: Secondary | ICD-10-CM | POA: Diagnosis not present

## 2018-12-30 DIAGNOSIS — I5033 Acute on chronic diastolic (congestive) heart failure: Secondary | ICD-10-CM | POA: Diagnosis not present

## 2018-12-30 DIAGNOSIS — R739 Hyperglycemia, unspecified: Secondary | ICD-10-CM | POA: Diagnosis not present

## 2018-12-30 DIAGNOSIS — I5082 Biventricular heart failure: Secondary | ICD-10-CM | POA: Diagnosis present

## 2018-12-30 DIAGNOSIS — G4700 Insomnia, unspecified: Secondary | ICD-10-CM | POA: Diagnosis present

## 2018-12-30 DIAGNOSIS — J810 Acute pulmonary edema: Secondary | ICD-10-CM | POA: Diagnosis not present

## 2018-12-30 DIAGNOSIS — G931 Anoxic brain damage, not elsewhere classified: Secondary | ICD-10-CM | POA: Diagnosis not present

## 2018-12-30 DIAGNOSIS — I10 Essential (primary) hypertension: Secondary | ICD-10-CM | POA: Diagnosis not present

## 2018-12-30 DIAGNOSIS — R0602 Shortness of breath: Secondary | ICD-10-CM | POA: Diagnosis not present

## 2018-12-30 DIAGNOSIS — R4182 Altered mental status, unspecified: Secondary | ICD-10-CM | POA: Diagnosis not present

## 2018-12-30 DIAGNOSIS — N179 Acute kidney failure, unspecified: Secondary | ICD-10-CM | POA: Diagnosis not present

## 2018-12-30 DIAGNOSIS — I50810 Right heart failure, unspecified: Secondary | ICD-10-CM

## 2018-12-30 DIAGNOSIS — R0989 Other specified symptoms and signs involving the circulatory and respiratory systems: Secondary | ICD-10-CM | POA: Diagnosis not present

## 2018-12-30 DIAGNOSIS — E119 Type 2 diabetes mellitus without complications: Secondary | ICD-10-CM | POA: Diagnosis not present

## 2018-12-30 DIAGNOSIS — Z4682 Encounter for fitting and adjustment of non-vascular catheter: Secondary | ICD-10-CM | POA: Diagnosis not present

## 2018-12-30 LAB — URINALYSIS, ROUTINE W REFLEX MICROSCOPIC
Bilirubin Urine: NEGATIVE
Glucose, UA: NEGATIVE mg/dL
Hgb urine dipstick: NEGATIVE
Ketones, ur: NEGATIVE mg/dL
Leukocytes,Ua: NEGATIVE
Nitrite: NEGATIVE
Protein, ur: 30 mg/dL — AB
Specific Gravity, Urine: 1.015 (ref 1.005–1.030)
pH: 5 (ref 5.0–8.0)

## 2018-12-30 LAB — POCT I-STAT 7, (LYTES, BLD GAS, ICA,H+H)
Acid-Base Excess: 9 mmol/L — ABNORMAL HIGH (ref 0.0–2.0)
Bicarbonate: 35.8 mmol/L — ABNORMAL HIGH (ref 20.0–28.0)
Calcium, Ion: 1.08 mmol/L — ABNORMAL LOW (ref 1.15–1.40)
HCT: 30 % — ABNORMAL LOW (ref 39.0–52.0)
Hemoglobin: 10.2 g/dL — ABNORMAL LOW (ref 13.0–17.0)
O2 Saturation: 91 %
Potassium: 4.7 mmol/L (ref 3.5–5.1)
Sodium: 137 mmol/L (ref 135–145)
TCO2: 38 mmol/L — ABNORMAL HIGH (ref 22–32)
pCO2 arterial: 63.4 mmHg — ABNORMAL HIGH (ref 32.0–48.0)
pH, Arterial: 7.359 (ref 7.350–7.450)
pO2, Arterial: 67 mmHg — ABNORMAL LOW (ref 83.0–108.0)

## 2018-12-30 LAB — COMPREHENSIVE METABOLIC PANEL
ALT: 12 U/L (ref 0–44)
AST: 20 U/L (ref 15–41)
Albumin: 3.1 g/dL — ABNORMAL LOW (ref 3.5–5.0)
Alkaline Phosphatase: 97 U/L (ref 38–126)
Anion gap: 11 (ref 5–15)
BUN: 25 mg/dL — ABNORMAL HIGH (ref 8–23)
CO2: 32 mmol/L (ref 22–32)
Calcium: 8 mg/dL — ABNORMAL LOW (ref 8.9–10.3)
Chloride: 96 mmol/L — ABNORMAL LOW (ref 98–111)
Creatinine, Ser: 1.2 mg/dL (ref 0.61–1.24)
GFR calc Af Amer: >60 mL/min (ref >60)
GFR calc non Af Amer: >60 mL/min (ref >60)
Glucose, Bld: 147 mg/dL — ABNORMAL HIGH (ref 70–99)
Potassium: 4.9 mmol/L (ref 3.5–5.1)
Sodium: 139 mmol/L (ref 135–145)
Total Bilirubin: 0.3 mg/dL (ref 0.3–1.2)
Total Protein: 6.6 g/dL (ref 6.5–8.1)

## 2018-12-30 LAB — GLUCOSE, CAPILLARY: Glucose-Capillary: 173 mg/dL — ABNORMAL HIGH (ref 70–99)

## 2018-12-30 LAB — CBC WITH DIFFERENTIAL/PLATELET
Abs Immature Granulocytes: 0.06 10*3/uL (ref 0.00–0.07)
Basophils Absolute: 0.1 10*3/uL (ref 0.0–0.1)
Basophils Relative: 1 %
Eosinophils Absolute: 0 10*3/uL (ref 0.0–0.5)
Eosinophils Relative: 0 %
HCT: 32.3 % — ABNORMAL LOW (ref 39.0–52.0)
HEMOGLOBIN: 9 g/dL — AB (ref 13.0–17.0)
Immature Granulocytes: 1 %
Lymphocytes Relative: 10 %
Lymphs Abs: 0.8 10*3/uL (ref 0.7–4.0)
MCH: 25.8 pg — ABNORMAL LOW (ref 26.0–34.0)
MCHC: 27.9 g/dL — AB (ref 30.0–36.0)
MCV: 92.6 fL (ref 80.0–100.0)
Monocytes Absolute: 0.8 10*3/uL (ref 0.1–1.0)
Monocytes Relative: 10 %
Neutro Abs: 6.2 10*3/uL (ref 1.7–7.7)
Neutrophils Relative %: 78 %
Platelets: 481 10*3/uL — ABNORMAL HIGH (ref 150–400)
RBC: 3.49 MIL/uL — AB (ref 4.22–5.81)
RDW: 18.1 % — ABNORMAL HIGH (ref 11.5–15.5)
WBC: 7.9 10*3/uL (ref 4.0–10.5)
nRBC: 0.4 % — ABNORMAL HIGH (ref 0.0–0.2)

## 2018-12-30 LAB — I-STAT TROPONIN, ED: Troponin i, poc: 0.03 ng/mL (ref 0.00–0.08)

## 2018-12-30 LAB — MAGNESIUM: Magnesium: 2.1 mg/dL (ref 1.7–2.4)

## 2018-12-30 LAB — PROTIME-INR
INR: 1.6 — ABNORMAL HIGH (ref 0.8–1.2)
Prothrombin Time: 18.4 seconds — ABNORMAL HIGH (ref 11.4–15.2)

## 2018-12-30 LAB — BRAIN NATRIURETIC PEPTIDE: B Natriuretic Peptide: 1273 pg/mL — ABNORMAL HIGH (ref 0.0–100.0)

## 2018-12-30 LAB — MRSA PCR SCREENING: MRSA by PCR: NEGATIVE

## 2018-12-30 MED ORDER — GABAPENTIN 600 MG PO TABS
600.0000 mg | ORAL_TABLET | Freq: Two times a day (BID) | ORAL | Status: DC
  Administered 2018-12-30 – 2019-01-04 (×10): 600 mg via ORAL
  Filled 2018-12-30 (×10): qty 1

## 2018-12-30 MED ORDER — RIVAROXABAN 20 MG PO TABS: 20.0000 mg | ORAL_TABLET | Freq: Every day | ORAL | Status: DC

## 2018-12-30 MED ORDER — ISOSORB DINITRATE-HYDRALAZINE 20-37.5 MG PO TABS
1.0000 | ORAL_TABLET | Freq: Three times a day (TID) | ORAL | Status: DC
  Administered 2018-12-31 – 2019-01-04 (×13): 1 via ORAL
  Filled 2018-12-30 (×13): qty 1

## 2018-12-30 MED ORDER — ONDANSETRON HCL 4 MG/2ML IJ SOLN: 4.0000 mg | Freq: Four times a day (QID) | INTRAMUSCULAR | Status: DC | PRN

## 2018-12-30 MED ORDER — FUROSEMIDE 10 MG/ML IJ SOLN
40.0000 mg | Freq: Once | INTRAMUSCULAR | Status: AC
  Administered 2018-12-30: 40 mg via INTRAVENOUS
  Filled 2018-12-30: qty 4

## 2018-12-30 MED ORDER — OXYCODONE HCL 5 MG PO TABS
5.0000 mg | ORAL_TABLET | Freq: Four times a day (QID) | ORAL | Status: DC | PRN
  Administered 2018-12-30 – 2018-12-31 (×3): 5 mg via ORAL
  Filled 2018-12-30 (×3): qty 1

## 2018-12-30 MED ORDER — FERROUS SULFATE 325 (65 FE) MG PO TABS: 325.0000 mg | ORAL_TABLET | Freq: Three times a day (TID) | ORAL | Status: DC | PRN

## 2018-12-30 MED ORDER — INSULIN ASPART PROT & ASPART (70-30 MIX) 100 UNIT/ML ~~LOC~~ SUSP
15.0000 [IU] | Freq: Two times a day (BID) | SUBCUTANEOUS | Status: DC
  Administered 2018-12-31: 15 [IU] via SUBCUTANEOUS
  Filled 2018-12-30 (×2): qty 10

## 2018-12-30 MED ORDER — MAGNESIUM OXIDE 400 (241.3 MG) MG PO TABS
400.0000 mg | ORAL_TABLET | Freq: Two times a day (BID) | ORAL | Status: DC
  Administered 2018-12-30 – 2019-01-04 (×10): 400 mg via ORAL
  Filled 2018-12-30 (×10): qty 1

## 2018-12-30 MED ORDER — ACETAMINOPHEN 650 MG RE SUPP: 650.0000 mg | Freq: Four times a day (QID) | RECTAL | Status: DC | PRN

## 2018-12-30 MED ORDER — FUROSEMIDE 10 MG/ML IJ SOLN
80.0000 mg | Freq: Two times a day (BID) | INTRAMUSCULAR | Status: DC
  Administered 2018-12-30 – 2018-12-31 (×2): 80 mg via INTRAVENOUS
  Filled 2018-12-30 (×2): qty 8

## 2018-12-30 MED ORDER — DIAZEPAM 5 MG PO TABS
10.0000 mg | ORAL_TABLET | Freq: Every day | ORAL | Status: DC | PRN
  Administered 2018-12-30 – 2019-01-03 (×5): 10 mg via ORAL
  Filled 2018-12-30 (×5): qty 2

## 2018-12-30 MED ORDER — OXYCODONE-ACETAMINOPHEN 5-325 MG PO TABS
1.0000 | ORAL_TABLET | Freq: Four times a day (QID) | ORAL | Status: DC | PRN
  Administered 2018-12-30 – 2018-12-31 (×3): 1 via ORAL
  Filled 2018-12-30 (×3): qty 1

## 2018-12-30 MED ORDER — ROSUVASTATIN CALCIUM 20 MG PO TABS
20.0000 mg | ORAL_TABLET | Freq: Every day | ORAL | Status: DC
  Administered 2018-12-30 – 2019-01-03 (×5): 20 mg via ORAL
  Filled 2018-12-30 (×5): qty 1

## 2018-12-30 MED ORDER — FOLIC ACID 1 MG PO TABS
1.0000 mg | ORAL_TABLET | Freq: Every day | ORAL | Status: DC
  Administered 2018-12-31 – 2019-01-04 (×5): 1 mg via ORAL
  Filled 2018-12-30 (×5): qty 1

## 2018-12-30 MED ORDER — RIVAROXABAN 20 MG PO TABS
20.0000 mg | ORAL_TABLET | Freq: Every day | ORAL | Status: DC
  Administered 2018-12-30 – 2019-01-03 (×5): 20 mg via ORAL
  Filled 2018-12-30 (×5): qty 1

## 2018-12-30 MED ORDER — IPRATROPIUM-ALBUTEROL 0.5-2.5 (3) MG/3ML IN SOLN
RESPIRATORY_TRACT | Status: AC
  Filled 2018-12-30: qty 3

## 2018-12-30 MED ORDER — MAGNESIUM OXIDE -MG SUPPLEMENT 400 (240 MG) MG PO TABS
420.0000 mg | ORAL_TABLET | Freq: Two times a day (BID) | ORAL | Status: DC
  Filled 2018-12-30 (×2): qty 2

## 2018-12-30 MED ORDER — ONDANSETRON HCL 4 MG PO TABS: 4.0000 mg | ORAL_TABLET | Freq: Four times a day (QID) | ORAL | Status: DC | PRN

## 2018-12-30 MED ORDER — ISOSORB DINITRATE-HYDRALAZINE 20-37.5 MG PO TABS: 1.0000 | ORAL_TABLET | Freq: Three times a day (TID) | ORAL | 3 refills | Status: DC

## 2018-12-30 MED ORDER — MOMETASONE FURO-FORMOTEROL FUM 200-5 MCG/ACT IN AERO
2.0000 | INHALATION_SPRAY | Freq: Two times a day (BID) | RESPIRATORY_TRACT | Status: DC
  Administered 2018-12-31 – 2019-01-04 (×9): 2 via RESPIRATORY_TRACT
  Filled 2018-12-30: qty 8.8

## 2018-12-30 MED ORDER — POTASSIUM CHLORIDE CRYS ER 20 MEQ PO TBCR
20.0000 meq | EXTENDED_RELEASE_TABLET | Freq: Every day | ORAL | Status: DC
  Administered 2018-12-31 – 2019-01-01 (×2): 20 meq via ORAL
  Filled 2018-12-30 (×2): qty 1

## 2018-12-30 MED ORDER — FUROSEMIDE 10 MG/ML IJ SOLN: 40.0000 mg | Freq: Once | INTRAMUSCULAR | Status: DC

## 2018-12-30 MED ORDER — FLUTICASONE PROPIONATE 50 MCG/ACT NA SUSP: 2.0000 | Freq: Every day | NASAL | Status: DC | PRN

## 2018-12-30 MED ORDER — LATANOPROST 0.005 % OP SOLN
1.0000 [drp] | Freq: Every day | OPHTHALMIC | Status: DC
  Administered 2018-12-31 – 2019-01-13 (×14): 1 [drp] via OPHTHALMIC
  Filled 2018-12-30 (×2): qty 2.5

## 2018-12-30 MED ORDER — INSULIN ASPART PROT & ASPART (70-30 MIX) 100 UNIT/ML PEN
15.0000 [IU] | PEN_INJECTOR | Freq: Two times a day (BID) | SUBCUTANEOUS | Status: DC
  Filled 2018-12-30: qty 3

## 2018-12-30 MED ORDER — ACETAMINOPHEN 325 MG PO TABS
650.0000 mg | ORAL_TABLET | Freq: Four times a day (QID) | ORAL | Status: DC | PRN
  Administered 2018-12-31: 650 mg via ORAL
  Filled 2018-12-30: qty 2

## 2018-12-30 MED ORDER — SENNOSIDES-DOCUSATE SODIUM 8.6-50 MG PO TABS
1.0000 | ORAL_TABLET | Freq: Every evening | ORAL | Status: DC | PRN
  Administered 2019-01-03: 1 via ORAL
  Filled 2018-12-30: qty 1

## 2018-12-30 MED ORDER — BISOPROLOL FUMARATE 5 MG PO TABS: 2.5000 mg | ORAL_TABLET | Freq: Every day | ORAL | 3 refills | Status: DC

## 2018-12-30 MED ORDER — OXYCODONE-ACETAMINOPHEN 10-325 MG PO TABS: 1.0000 | ORAL_TABLET | Freq: Four times a day (QID) | ORAL | Status: DC | PRN

## 2018-12-30 MED ORDER — IPRATROPIUM-ALBUTEROL 0.5-2.5 (3) MG/3ML IN SOLN
3.0000 mL | Freq: Four times a day (QID) | RESPIRATORY_TRACT | Status: DC
  Administered 2018-12-30 – 2019-01-02 (×10): 3 mL via RESPIRATORY_TRACT
  Filled 2018-12-30 (×9): qty 3

## 2018-12-30 NOTE — H&P (Signed)
History and Physical  Nicholas Caldwell ZOX:[id number] DOB: 1946/04/21 DOA: 12/30/2018  PCP: [facility] Patient coming from: Home   I have personally briefly reviewed patient's old medical records in [place]   Chief Complaint: SOB, Dizziness  HPI: Nicholas Caldwell is a 73 y.o. male past medical history significant for combined systolic and diastolic heart failure, last ejection fraction 40 to 45% by 2D echo 2020, paroxysmal A. fib, pulmonary hypertension, nonischemic cardiomyopathy, COPD chronic hypoxic respiratory failure on 3 L of oxygen who presents complaining of progressive worsening shortness of breath worse over the last couple of days.  He report 10 to 15 pounds gain over the last 2 to 3 weeks.  He is also complaining of abdominal distention, abdominal pain.  He denies chest pain.  Evaluation in the ED; arterial gas pH 7.3, PCO2 63 PO2 67, sodium 139, potassium 4.9 BUN 25 creatinine 1.2, chest x-ray: Yesterday heart failure, bilateral pleural effusion.  BNP 1273.   Review of Systems: All systems reviewed and apart from history of presenting illness, are negative.  Past Medical History:  Diagnosis Date  . Atrial flutter ([place])    a. recurrent AFlutter with RVR;  b. Amiodarone Rx started 4/16  . CAD (coronary artery disease)    a. LHC 1/16:  mLAD diffuse disease, pLCx mild disease, dLCx with disease but too small for PCI, RCA ok, EF 25-30%  . Chronic pain   . Chronic systolic CHF (congestive heart failure) ([place])   . COPD (chronic obstructive pulmonary disease) ([place])   . Diabetes mellitus without complication ([place])   . Hypercholesteremia   . Hypertension   . NICM (nonischemic cardiomyopathy) ([place])    a.dx 2016. b. 2D echo 06/2016 - Last echo 07/01/16: mod dilated LV, mod LVH, EF 25-30%, mild-mod MR, sev LAE, mild-mod reduced RV systolic function, mild-mod TR, PASP 39mHG.  .Marland KitchenPAF (paroxysmal atrial fibrillation) (H[facility])    On amio - ot a candidate for flecainide due  to cardiomyopathy, not a candidate for Tikosyn due to prolonged QT, and felt to be a poor candidate for ablation given left atrial size.  . [place]monary [place]   . Tobacco abuse    Past Surgical History:  Procedure Laterality Date  . CARDIOVERSION N/A 07/30/2018   Procedure: CARDIOVERSION;  Surgeon: MLarey Dresser MD;  [facility];  Service: Cardiovascular;  Laterality: N/A;  . LEFT AND RIGHT HEART CATHETERIZATION WITH CORONARY ANGIOGRAM N/A 11/06/2014   Procedure: LEFT AND RIGHT HEART CATHETERIZATION WITH CORONARY ANGIOGRAM;  Surgeon: JJettie Booze MD;  [facility];  Service: Cardiovascular;  Laterality: N/A;  . RIGHT/LEFT HEART CATH AND CORONARY ANGIOGRAPHY N/A 12/06/2018   Procedure: RIGHT/LEFT HEART CATH AND CORONARY ANGIOGRAPHY;  Surgeon: MLarey Dresser MD;  [facility];  Service: Cardiovascular;  Laterality: N/A;  . SPLENECTOMY     Social History:  reports that he has been smoking cigarettes. He has a 34.00 pack-year smoking history. He has never used smokeless tobacco. He reports that he does not drink alcohol or use drugs.   No Known Allergies  Family History  Problem Relation Age of Onset  . Heart disease Mother   . Hypertension Mother   . Heart failure Mother   . Heart disease Father   . Hypertension Sister   . Heart attack Neg Hx   . Stroke Neg Hx     Prior to Admission medications   Medication Sig Start Date End Date Taking? Authorizing  Provider  azithromycin (ZITHROMAX) 500 MG tablet Take 1 tablet (500 mg total) by mouth daily. 12/13/18   Debbe Odea, MD  bisoprolol (ZEBETA) 5 MG tablet Take 0.5 tablets (2.5 mg total) by mouth daily for 30 days. 12/30/18 01/29/19  Shirley Friar, PA-C  budesonide-formoterol (SYMBICORT) 160-4.5 MCG/ACT inhaler Inhale 2 puffs into the lungs 2 (two) times daily.    [provider]  cefdinir (OMNICEF) 300 MG capsule Take 1 capsule (300 mg total) by mouth every 12  (twelve) hours. 12/12/18   Debbe Odea, MD  diazepam (VALIUM) 10 MG tablet Take 0.5 tablets (5 mg total) by mouth daily as needed for anxiety or sleep. Patient taking differently: Take 10 mg by mouth daily as needed for anxiety or sleep.  12/19/16   Theodis Blaze, MD  ferrous sulfate 325 (65 FE) MG tablet Take 325 mg by mouth 3 (three) times daily as needed (for supplementation).    [provider]  fluticasone (FLONASE) 50 MCG/ACT nasal spray Place 2 sprays into both nostrils daily as needed for allergies.     [provider]  folic acid (FOLVITE) 1 MG tablet Take 1 mg by mouth daily.    [provider]  gabapentin (NEURONTIN) 600 MG tablet Take 1 tablet (600 mg total) by mouth 2 (two) times daily. 12/19/16   Theodis Blaze, MD  glucose 4 GM chewable tablet Chew 4 tablets by mouth See admin instructions. Chew 4 tablets by mouth as needed for low blood sugar - repeat every 15 minutes if blood sugar less than 70    [provider]  guaifenesin (HUMIBID E) 400 MG TABS tablet Take 1 tablet (400 mg total) by mouth every 6 (six) hours as needed. Patient taking differently: Take 400 mg by mouth every 6 (six) hours as needed (cough).  08/02/18   ,  A, MD  hydrocortisone (ANUSOL-HC) 2.5 % rectal cream Place 1 application rectally 2 (two) times daily as needed for hemorrhoids or itching.     [provider]  hydrocortisone 1 % lotion Apply 1 application topically See admin instructions. Apply small amount to back twice daily for itchy rash    [provider]  insulin aspart protamine - aspart (NOVOLOG MIX 70/30 FLEXPEN) (70-30) 100 UNIT/ML FlexPen Inject 0.05 mLs (5 Units total) into the skin 2 (two) times daily. Patient taking differently: Inject 30 Units into the skin 2 (two) times daily.  08/02/18   ,  A, MD  ipratropium-albuterol (DUONEB) 0.5-2.5 (3) MG/3ML SOLN Take 3 mLs by nebulization 2 (two) times daily.    [provider]  isosorbide-hydrALAZINE (BIDIL) 20-37.5 MG tablet Take 1 tablet by mouth 3 (three) times daily. 12/30/18   Shirley Friar, PA-C  latanoprost (XALATAN) 0.005 % ophthalmic solution Place 1 drop into both eyes at bedtime.    [provider]  Magnesium Oxide 420 (252 Mg) MG TABS Take 420 mg by mouth 2 (two) times daily.    [provider]  metFORMIN (GLUCOPHAGE-XR) 500 MG 24 hr tablet Take 1,000 mg by mouth 2 (two) times daily with a meal.    [provider]  oxyCODONE-acetaminophen (PERCOCET) 10-325 MG tablet Take 1 tablet by mouth every 6 (six) hours as needed for pain.    [provider]  OXYGEN Inhale 3 L into the lungs continuous.     [provider]  potassium chloride SA (K-DUR,KLOR-CON) 20 MEQ tablet Take 2 tablets (40 mEq total) by mouth every morning AND  1 tablet (20 mEq total) at bedtime. Patient taking differently: Take 2 tablets (40 mEq total) by mouth every morning  AND 1 tablet (20 mEq total) at bedtime. 11/26/18   Amin, Jeanella Flattery, MD  rivaroxaban (XARELTO) 20 MG TABS tablet Take 1 tablet (20 mg total) by mouth daily with supper. Patient taking differently: Take 20 mg by mouth daily at 6 PM.  01/20/17   Jettie Booze, MD  rosuvastatin (CRESTOR) 20 MG tablet Take 1 tablet (20 mg total) by mouth daily. 05/07/18   Matilde Haymaker, MD  torsemide (DEMADEX) 20 MG tablet Take 4 tablets (80 mg total) by mouth 2 (two) times daily for 30 days. 11/26/18 12/26/18  Damita Lack, MD   Physical Exam: Vitals:   12/30/18 1745 12/30/18 1800 12/30/18 1838 12/30/18 1840  BP: 111/61 110/63 (!) 121/59   Pulse: 79 80 77 82  Resp: (!) 22 (!) 23 (!) 22   Temp:   98.6 F (37 C)   TempSrc:   Oral   SpO2: 95% 96% 95% 93%  Weight:    130.1 kg  Height:    6' (1.829 m)     General exam: Moderately built and nourished patient, mild distress  Head, eyes and ENT: Nontraumatic and normocephalic. Pupils equally reacting to light and  accommodation. Oral mucosa moist.  Neck: Supple.  Positive JVD, carotid bruit or thyromegaly.  Lymphatics: No lymphadenopathy.  Respiratory system: Mild tachypnea, bilateral diffuse cracker  Cardiovascular system: S1 and S2 heard, RRR.  Positive JVD, murmurs, gallops, clicks, bilateral lower extremity edema  Gastrointestinal system: Abdomen is very distended, soft and nontender. Normal bowel sounds heard. No organomegaly or masses appreciated.  Central nervous system: Alert and oriented. No focal neurological deficits.  Extremities: Symmetric 5 x 5 power. Peripheral pulses symmetrically felt.   Skin: No rashes or acute findings.  Musculoskeletal system: Negative exam.  Psychiatry: Pleasant and cooperative.   Labs on Admission:  Basic Metabolic Panel: Recent Labs  Lab 12/30/18 1621 12/30/18 1639 12/30/18 1742  NA 139 137  --   K 4.9 4.7  --   CL 96*  --   --   CO2 32  --   --   GLUCOSE 147*  --   --   BUN 25*  --   --   CREATININE 1.20  --   --   CALCIUM 8.0*  --   --   MG  --   --  2.1   Liver Function Tests: Recent Labs  Lab 12/30/18 1621  AST 20  ALT 12  ALKPHOS 97  BILITOT 0.3  PROT 6.6  ALBUMIN 3.1*   No results for input(s): LIPASE, AMYLASE in the last 168 hours. No results for input(s): AMMONIA in the last 168 hours. CBC: Recent Labs  Lab 12/30/18 1621 12/30/18 1639  WBC 7.9  --   NEUTROABS 6.2  --   HGB 9.0* 10.2*  HCT 32.3* 30.0*  MCV 92.6  --   PLT 481*  --    Cardiac Enzymes: No results for input(s): CKTOTAL, CKMB, CKMBINDEX, TROPONINI in the last 168 hours.  BNP (last 3 results) No results for input(s): PROBNP in the last 8760 hours. CBG: No results for input(s): GLUCAP in the last 168 hours.  Radiological Exams on Admission: Dg Chest Port 1 View  Result Date: 12/30/2018 CLINICAL DATA:  Dyspnea EXAM: PORTABLE CHEST 1 VIEW COMPARISON:  12/07/2018 FINDINGS: Diffuse bilateral airspace disease compatible with edema is unchanged. Small  bilateral effusions and  bibasilar atelectasis unchanged. Right-sided vascular catheter has been removed. IMPRESSION: No interval change in congestive heart failure with edema and bilateral effusions. Electronically Signed   By: Franchot Gallo M.D.   On: 12/30/2018 17:02    EKG: Independently reviewed.  Sinus rhythm  Assessment/Plan Active Problems:   Essential hypertension   Type 2 diabetes mellitus with neuropathy   PAF (paroxysmal atrial fibrillation) (H[facility])   Nonischemic cardiomyopathy ([facility])   CAD - Non-obstructive by LHC1/16   Acute on chronic combined systolic and diastolic CHF (congestive heart failure) (H[facility])   Heart failure ([facility])   1-Acute hypoxic hypercapnic respiratory failure: In the setting of heart failure exacerbation Patient uses 3 L of oxygen currently he is on 4 L of oxygen Worsening hypoxemia on ABG. Start IV Lasix.  Nebulizer treatment. I have order his CPAP  2-Acute on chronic combined systolic and diastolic heart failure exacerbation: Present with more than 10 pound weight gain over a period of 3 weeks, worsening shortness of breath, hypoxemia, elevated BNP, chest x-ray with pulmonary edema. -He reports that he has been taking his torsemide. -Will start IV Lasix 80 mg IV twice daily. -Hold beta-blocker. -Continue with Imdur hydralazine, if blood pressure allows. -We will need to consult cardiology/heart failure team in the morning -Daily weight, strict I's and O's.  3-paroxysmal A. fib: Hold beta-blockers in the setting of heart failure exacerbation. Continue with Xarelto.  4-COPD; continue with nebulizer treatment, Symbicort.  5-Chronic pain, anxiety: Continue with oxycodone, and Valium as needed.     DVT Prophylaxis: Xarelto Code Status: Full code Family Communication: Care discussed with patient Disposition Plan: Mid to the stepdown unit for IV diuresis, monitor respiratory status, might require BiPAP  Time spent: 75 minutes    It is my clinical  opinion that admission to INPATIENT is reasonable and necessary in this 73 y.o. male . presenting with symptoms of shortness of breath, tachypnea, worsening hypoxemia again, concerning for acute on chronic combined systolic diastolic heart failure exacerbation, acute on chronic hypoxic respiratory failure . in the context of PMH including: Heart failure, COPD, paroxysmal A. fib . with pertinent positives on physical exam including: Bilateral crackles, abdominal distention, bilateral lower extremity edema . and pertinent positives on radiographic and laboratory data including: Chest x-ray with pulmonary edema, BNP elevated. . Workup and treatment include chest x-ray, BNP electrolytes panel.  IV Lasix, nebulizer treatment  Given the aforementioned, the predictability of an adverse outcome is felt to be significant. I expect that the patient will require at least 2 midnights in the hospital to treat this condition.  Elmarie Shiley MD [facility]   12/30/2018, 6:52 PM

## 2018-12-30 NOTE — Progress Notes (Signed)
Pt asked and encouraged to use CPAP for the night.  Pt stated he does not wear one at home.  Pt on 3L [place]. No distress noted at this time.

## 2018-12-30 NOTE — ED Triage Notes (Signed)
Pt to ED from home via EMS after 12 lb weight gain in last 3 days. Endorses shob, Wheezing bilaterally with EMS, 0.5 albuterol and 0.5 atrovent given PTA. Wears 3 L O2 at home.

## 2018-12-30 NOTE — ED Notes (Signed)
Called lab to add on Magnesium

## 2018-12-30 NOTE — ED Provider Notes (Signed)
[facility]ERG[facility] DEPARTMENT Provider Note   CSN: [id number] Arrival date & time: 12/30/18  1606    History   Chief Complaint Chief Complaint  Patient presents with  . Shortness of Breath    HPI Nicholas Caldwell is a 73 y.o. male.     73 year old male with prior medical history as detailed below presents for evaluation for of shortness of breath.  Patient reports prior history of CHF.  Patient reports approximately 12 pounds of weight gain over the last 4 days.  He reports increased edema to his lower extremities and lower abdomen.  He denies chest pain.  He denies fever.  He reports that his last admission was last month into this month.  Patient wears 3 and half liters nasal cannula O2 at all times.  He reports full compliance with his diuretic medications.  The history is provided by the patient and medical records.  Shortness of Breath  Severity:  Moderate Onset quality:  Gradual Duration:  4 days Timing:  Constant Progression:  Waxing and waning Chronicity:  New Relieved by:  Nothing Worsened by:  Nothing Ineffective treatments:  None tried Associated symptoms: no chest pain and no fever     Past Medical History:  Diagnosis Date  . Atrial flutter ([place])    a. recurrent AFlutter with RVR;  b. Amiodarone Rx started 4/16  . CAD (coronary artery disease)    a. LHC 1/16:  mLAD diffuse disease, pLCx mild disease, dLCx with disease but too small for PCI, RCA ok, EF 25-30%  . Chronic pain   . Chronic systolic CHF (congestive heart failure) ([place])   . COPD (chronic obstructive pulmonary disease) ([place])   . Diabetes mellitus without complication ([place])   . Hypercholesteremia   . Hypertension   . NICM (nonischemic cardiomyopathy) ([place])    a.dx 2016. b. 2D echo 06/2016 - Last echo 07/01/16: mod dilated LV, mod LVH, EF 25-30%, mild-mod MR, sev LAE, mild-mod reduced RV systolic function, mild-mod TR, PASP 82mHG.  .Marland KitchenPAF (paroxysmal atrial fibrillation) (HCC)    On amio - ot a candidate for flecainide due to cardiomyopathy, not a candidate for Tikosyn due to prolonged QT, and felt to be a poor candidate for ablation given left atrial size.  . Pulmonary hypertension (HAthens   . Tobacco abuse     Patient Active Problem List   Diagnosis Date Noted  . AKI (acute kidney injury) (H[place]   . Acute respiratory failure (HE[place] 11/22/2018  . Acute on chronic respiratory failure with hypoxia and hypercapnia (H[place] 11/13/2018  . Metabolic encephalopathy 076/81/1572 . Acute respiratory failure with hypoxia and hypercapnia (HGramercy 07/26/2018  . Acute exacerbation of CHF (congestive heart failure) ([place] 07/26/2018  . CHF exacerbation ([place] 05/01/2018  . CHF (congestive heart failure) (HL[place] 04/30/2018  . Chronic respiratory failure with hypoxia (H[place] 12/28/2017  . Atrial fibrillation with RVR (HSanost[place]   . SVT (supraventricular tachycardia) (HM[place]   . COPD GOLD 0   . Medically noncompliant   . Panlobular emphysema (HPlymouth   . OSA (obstructive sleep apnea)   . Pulmonary hypertension (HSandborn   . Disorientation   . Pressure injury of skin 09/07/2016  . Acute on chronic combined systolic and diastolic CHF (congestive heart failure) (HKalispell 09/05/2016  . Skin lesion-left heal 09/05/2016  . Chronic systolic CHF (congestive heart failure) (HSouth Wind[place] 07/16/2016  . COPD (chronic obstructive pulmonary disease) (HPleasantville 07/16/2016  . Uncontrolled type 2 diabetes mellitus with complication (HBauxite   . Diabetic polyneuropathy  associated with diabetes mellitus due to underlying condition ([place])   . Normocytic anemia 06/29/2016  . CAD - Non-obstructive by LHC1/16 01/21/2016  . Nonischemic cardiomyopathy ([place]) 10/09/2015  . PAF (paroxysmal atrial fibrillation) ([place])   . Chronic pain 02/19/2015  . Morbid obesity ([place]) 02/13/2015  . DOE (dyspnea on exertion)   . Elevated troponin I level 11/01/2014  . COPD exacerbation ([place]) 11/01/2014  . Essential hypertension 10/31/2014  . Type 2 diabetes  mellitus with neuropathy 10/31/2014  . Hyperlipidemia  10/31/2014  . Cigarette smoker 10/31/2014    Past Surgical History:  Procedure Laterality Date  . CARDIOVERSION N/A 07/30/2018   Procedure: CARDIOVERSION;  Surgeon: Larey Dresser, MD;  Location: [facility] ENDOSCOPY;  Service: Cardiovascular;  Laterality: N/A;  . LEFT AND RIGHT HEART CATHETERIZATION WITH CORONARY ANGIOGRAM N/A 11/06/2014   Procedure: LEFT AND RIGHT HEART CATHETERIZATION WITH CORONARY ANGIOGRAM;  Surgeon: Jettie Booze, MD;  Location: [facility] CATH LAB;  Service: Cardiovascular;  Laterality: N/A;  . RIGHT/LEFT HEART CATH AND CORONARY ANGIOGRAPHY N/A 12/06/2018   Procedure: RIGHT/LEFT HEART CATH AND CORONARY ANGIOGRAPHY;  Surgeon: Larey Dresser, MD;  Location: [facility] CV LAB;  Service: Cardiovascular;  Laterality: N/A;  . SPLENECTOMY          Home Medications    Prior to Admission medications   Medication Sig Start Date End Date Taking? Authorizing Provider  azithromycin (ZITHROMAX) 500 MG tablet Take 1 tablet (500 mg total) by mouth daily. 12/13/18   Debbe Odea, MD  bisoprolol (ZEBETA) 5 MG tablet Take 0.5 tablets (2.5 mg total) by mouth daily for 30 days. 12/30/18 01/29/19  Shirley Friar, PA-C  budesonide-formoterol (SYMBICORT) 160-4.5 MCG/ACT inhaler Inhale 2 puffs into the lungs 2 (two) times daily.    [provider]  cefdinir (OMNICEF) 300 MG capsule Take 1 capsule (300 mg total) by mouth every 12 (twelve) hours. 12/12/18   Debbe Odea, MD  diazepam (VALIUM) 10 MG tablet Take 0.5 tablets (5 mg total) by mouth daily as needed for anxiety or sleep. Patient taking differently: Take 10 mg by mouth daily as needed for anxiety or sleep.  12/19/16   Theodis Blaze, MD  ferrous sulfate 325 (65 FE) MG tablet Take 325 mg by mouth 3 (three) times daily as needed (for supplementation).    [provider]  fluticasone (FLONASE) 50 MCG/ACT nasal spray Place 2 sprays into both nostrils daily as needed  for allergies.     [provider]  folic acid (FOLVITE) 1 MG tablet Take 1 mg by mouth daily.    [provider]  gabapentin (NEURONTIN) 600 MG tablet Take 1 tablet (600 mg total) by mouth 2 (two) times daily. 12/19/16   Theodis Blaze, MD  glucose 4 GM chewable tablet Chew 4 tablets by mouth See admin instructions. Chew 4 tablets by mouth as needed for low blood sugar - repeat every 15 minutes if blood sugar less than 70    [provider]  guaifenesin (HUMIBID E) 400 MG TABS tablet Take 1 tablet (400 mg total) by mouth every 6 (six) hours as needed. Patient taking differently: Take 400 mg by mouth every 6 (six) hours as needed (cough).  08/02/18   Regalado, Belkys A, MD  hydrocortisone (ANUSOL-HC) 2.5 % rectal cream Place 1 application rectally 2 (two) times daily as needed for hemorrhoids or itching.     [provider]  hydrocortisone 1 % lotion Apply 1 application topically See admin instructions. Apply small amount  to back twice daily for itchy rash    [provider]  insulin aspart protamine - aspart (NOVOLOG MIX 70/30 FLEXPEN) (70-30) 100 UNIT/ML FlexPen Inject 0.05 mLs (5 Units total) into the skin 2 (two) times daily. Patient taking differently: Inject 30 Units into the skin 2 (two) times daily.  08/02/18   Regalado, Belkys A, MD  ipratropium-albuterol (DUONEB) 0.5-2.5 (3) MG/3ML SOLN Take 3 mLs by nebulization 2 (two) times daily.    [provider]  isosorbide-hydrALAZINE (BIDIL) 20-37.5 MG tablet Take 1 tablet by mouth 3 (three) times daily. 12/30/18   Shirley Friar, PA-C  latanoprost (XALATAN) 0.005 % ophthalmic solution Place 1 drop into both eyes at bedtime.    [provider]  Magnesium Oxide 420 (252 Mg) MG TABS Take 420 mg by mouth 2 (two) times daily.    [provider]  metFORMIN (GLUCOPHAGE-XR) 500 MG 24 hr tablet Take 1,000 mg by mouth 2 (two) times daily with a meal.    [provider]    oxyCODONE-acetaminophen (PERCOCET) 10-325 MG tablet Take 1 tablet by mouth every 6 (six) hours as needed for pain.    [provider]  OXYGEN Inhale 3 L into the lungs continuous.     [provider]  potassium chloride SA (K-DUR,KLOR-CON) 20 MEQ tablet Take 2 tablets (40 mEq total) by mouth every morning AND 1 tablet (20 mEq total) at bedtime. Patient taking differently: Take 2 tablets (40 mEq total) by mouth every morning  AND 1 tablet (20 mEq total) at bedtime. 11/26/18   Amin, Jeanella Flattery, MD  rivaroxaban (XARELTO) 20 MG TABS tablet Take 1 tablet (20 mg total) by mouth daily with supper. Patient taking differently: Take 20 mg by mouth daily at 6 PM.  01/20/17   Jettie Booze, MD  rosuvastatin (CRESTOR) 20 MG tablet Take 1 tablet (20 mg total) by mouth daily. 05/07/18   Matilde Haymaker, MD  torsemide (DEMADEX) 20 MG tablet Take 4 tablets (80 mg total) by mouth 2 (two) times daily for 30 days. 11/26/18 12/26/18  Damita Lack, MD    Family History Family History  Problem Relation Age of Onset  . Heart disease Mother   . Hypertension Mother   . Heart failure Mother   . Heart disease Father   . Hypertension Sister   . Heart attack Neg Hx   . Stroke Neg Hx     Social History Social History   Tobacco Use  . Smoking status: Current Every Day Smoker    Packs/day: 1.00    Years: 34.00    Pack years: 34.00    Types: Cigarettes  . Smokeless tobacco: Never Used  Substance Use Topics  . Alcohol use: No    Alcohol/week: 0.0 standard drinks  . Drug use: No     Allergies   Patient has no known allergies.   Review of Systems Review of Systems  Constitutional: Negative for fever.  Respiratory: Positive for shortness of breath.   Cardiovascular: Negative for chest pain.  All other systems reviewed and are negative.    Physical Exam Updated Vital Signs BP 118/64 (BP Location: Right Arm)   Pulse 79   Temp 98.1 F (36.7 C) (Oral)   Resp (!) 22   Ht 6'  (1.829 m)   Wt 127 kg   SpO2 94%   BMI 37.97 kg/m   Physical Exam Vitals signs and nursing note reviewed.  Constitutional:      General: He is  not in acute distress.    Appearance: He is well-developed.  HENT:     Head: Normocephalic and atraumatic.  Eyes:     Conjunctiva/sclera: Conjunctivae normal.     Pupils: Pupils are equal, round, and reactive to light.  Neck:     Musculoskeletal: Normal range of motion and neck supple.  Cardiovascular:     Rate and Rhythm: Normal rate and regular rhythm.     Heart sounds: Normal heart sounds.  Pulmonary:     Effort: Pulmonary effort is normal. No respiratory distress.     Breath sounds: Examination of the right-lower field reveals decreased breath sounds. Examination of the left-lower field reveals decreased breath sounds. Decreased breath sounds present.  Abdominal:     General: There is no distension.     Palpations: Abdomen is soft.     Tenderness: There is no abdominal tenderness.  Musculoskeletal: Normal range of motion.        General: No deformity.  Skin:    General: Skin is warm and dry.  Neurological:     Mental Status: He is alert and oriented to person, place, and time.      ED Treatments / Results  Labs (all labs ordered are listed, but only abnormal results are displayed) Labs Reviewed  CBC WITH DIFFERENTIAL/PLATELET - Abnormal; Notable for the following components:      Result Value   RBC 3.49 (*)    Hemoglobin 9.0 (*)    HCT 32.3 (*)    MCH 25.8 (*)    MCHC 27.9 (*)    RDW 18.1 (*)    Platelets 481 (*)    nRBC 0.4 (*)    All other components within normal limits  PROTIME-INR - Abnormal; Notable for the following components:   Prothrombin Time 18.4 (*)    INR 1.6 (*)    All other components within normal limits  COMPREHENSIVE METABOLIC PANEL - Abnormal; Notable for the following components:   Chloride 96 (*)    Glucose, Bld 147 (*)    BUN 25 (*)    Calcium 8.0 (*)    Albumin 3.1 (*)    All other  components within normal limits  BRAIN NATRIURETIC PEPTIDE - Abnormal; Notable for the following components:   B Natriuretic Peptide 1,273.0 (*)    All other components within normal limits  POCT I-STAT 7, (LYTES, BLD GAS, ICA,H+H) - Abnormal; Notable for the following components:   pCO2 arterial 63.4 (*)    pO2, Arterial 67.0 (*)    Bicarbonate 35.8 (*)    TCO2 38 (*)    Acid-Base Excess 9.0 (*)    Calcium, Ion 1.08 (*)    HCT 30.0 (*)    Hemoglobin 10.2 (*)    All other components within normal limits  URINALYSIS, ROUTINE W REFLEX MICROSCOPIC  BLOOD GAS, ARTERIAL  I-STAT TROPONIN, ED    EKG EKG Interpretation  Date/Time:  Thursday December 30 2018 16:12:34 EST Ventricular Rate:  79 PR Interval:    QRS Duration: 153 QT Interval:  490 QTC Calculation: 562 R Axis:   116 Text Interpretation:  Sinus or ectopic atrial rhythm IVCD, consider atypical RBBB Confirmed by Dene Gentry [phone]) on 12/30/2018 4:19:06 PM   Radiology Dg Chest Port 1 View  Result Date: 12/30/2018 CLINICAL DATA:  Dyspnea EXAM: PORTABLE CHEST 1 VIEW COMPARISON:  12/07/2018 FINDINGS: Diffuse bilateral airspace disease compatible with edema is unchanged. Small bilateral effusions and bibasilar atelectasis unchanged. Right-sided vascular catheter has been removed. IMPRESSION: No  interval change in congestive heart failure with edema and bilateral effusions. Electronically Signed   By: Franchot Gallo M.D.   On: 12/30/2018 17:02    Procedures Procedures (including critical care time)  Medications Ordered in ED Medications  furosemide (LASIX) injection 40 mg (40 mg Intravenous Given 12/30/18 1704)     Initial Impression / Assessment and Plan / ED Course  I have reviewed the triage vital signs and the nursing notes.  Pertinent labs & imaging results that were available during my care of the patient were reviewed by me and considered in my medical decision making (see chart for details).         MDM  Screen complete  Patient is presenting for evaluation of shortness of breath.  Patient's presentation today is consistent with likely CHF exacerbation.  Work-up performed in the ED confirms this diagnosis with elevations in the patient's BNP.  Chest x-ray does show edema.  Patient given initial dose of Lasix in the ED.  Hospitalist service is aware of case and will evaluate for admission. Final Clinical Impressions(s) / ED Diagnoses   Final diagnoses:  Dyspnea, unspecified type    ED Discharge Orders    None       Valarie Merino, MD 12/30/18 [phone]

## 2018-12-31 ENCOUNTER — Encounter (HOSPITAL_COMMUNITY): Payer: Self-pay | Admitting: General Practice

## 2018-12-31 DIAGNOSIS — I480 Paroxysmal atrial fibrillation: Secondary | ICD-10-CM

## 2018-12-31 LAB — GLUCOSE, CAPILLARY
GLUCOSE-CAPILLARY: 147 mg/dL — AB (ref 70–99)
Glucose-Capillary: 81 mg/dL (ref 70–99)
Glucose-Capillary: 100 mg/dL — ABNORMAL HIGH (ref 70–99)
Glucose-Capillary: 174 mg/dL — ABNORMAL HIGH (ref 70–99)

## 2018-12-31 LAB — CBC
HCT: 31 % — ABNORMAL LOW (ref 39.0–52.0)
Hemoglobin: 8.7 g/dL — ABNORMAL LOW (ref 13.0–17.0)
MCH: 25.6 pg — ABNORMAL LOW (ref 26.0–34.0)
MCHC: 28.1 g/dL — ABNORMAL LOW (ref 30.0–36.0)
MCV: 91.2 fL (ref 80.0–100.0)
Platelets: 448 10*3/uL — ABNORMAL HIGH (ref 150–400)
RBC: 3.4 MIL/uL — ABNORMAL LOW (ref 4.22–5.81)
RDW: 17.9 % — ABNORMAL HIGH (ref 11.5–15.5)
WBC: 7.6 10*3/uL (ref 4.0–10.5)
nRBC: 0.5 % — ABNORMAL HIGH (ref 0.0–0.2)

## 2018-12-31 LAB — BASIC METABOLIC PANEL
Anion gap: 7 (ref 5–15)
BUN: 24 mg/dL — ABNORMAL HIGH (ref 8–23)
CO2: 36 mmol/L — ABNORMAL HIGH (ref 22–32)
Calcium: 8.1 mg/dL — ABNORMAL LOW (ref 8.9–10.3)
Chloride: 96 mmol/L — ABNORMAL LOW (ref 98–111)
Creatinine, Ser: 1.16 mg/dL (ref 0.61–1.24)
GFR calc Af Amer: >60 mL/min (ref >60)
GFR calc non Af Amer: >60 mL/min (ref >60)
Glucose, Bld: 68 mg/dL — ABNORMAL LOW (ref 70–99)
Potassium: 4.5 mmol/L (ref 3.5–5.1)
Sodium: 139 mmol/L (ref 135–145)

## 2018-12-31 LAB — RAPID URINE DRUG SCREEN, HOSP PERFORMED
Amphetamines: NOT DETECTED
Barbiturates: NOT DETECTED
Benzodiazepines: POSITIVE — AB
COCAINE: NOT DETECTED
Opiates: POSITIVE — AB
Tetrahydrocannabinol: NOT DETECTED

## 2018-12-31 MED ORDER — OXYCODONE-ACETAMINOPHEN 10-325 MG PO TABS: 1.0000 | ORAL_TABLET | Freq: Four times a day (QID) | ORAL | Status: DC | PRN

## 2018-12-31 MED ORDER — FUROSEMIDE 10 MG/ML IJ SOLN
80.0000 mg | Freq: Three times a day (TID) | INTRAMUSCULAR | Status: DC
  Administered 2018-12-31 – 2019-01-03 (×9): 80 mg via INTRAVENOUS
  Filled 2018-12-31 (×9): qty 8

## 2018-12-31 MED ORDER — OXYCODONE HCL 5 MG PO TABS
5.0000 mg | ORAL_TABLET | Freq: Four times a day (QID) | ORAL | Status: DC | PRN
  Administered 2018-12-31 – 2019-01-04 (×12): 5 mg via ORAL
  Filled 2018-12-31 (×12): qty 1

## 2018-12-31 MED ORDER — METOLAZONE 5 MG PO TABS
5.0000 mg | ORAL_TABLET | Freq: Every day | ORAL | Status: DC
  Administered 2018-12-31 – 2019-01-01 (×2): 5 mg via ORAL
  Filled 2018-12-31 (×2): qty 1

## 2018-12-31 MED ORDER — OXYCODONE-ACETAMINOPHEN 5-325 MG PO TABS
1.0000 | ORAL_TABLET | Freq: Four times a day (QID) | ORAL | Status: DC | PRN
  Administered 2018-12-31 – 2019-01-04 (×12): 1 via ORAL
  Filled 2018-12-31 (×12): qty 1

## 2018-12-31 MED ORDER — INSULIN ASPART 100 UNIT/ML ~~LOC~~ SOLN
0.0000 [IU] | Freq: Three times a day (TID) | SUBCUTANEOUS | Status: DC
  Administered 2018-12-31: 2 [IU] via SUBCUTANEOUS
  Administered 2019-01-01 – 2019-01-02 (×5): 1 [IU] via SUBCUTANEOUS
  Administered 2019-01-03: 2 [IU] via SUBCUTANEOUS
  Administered 2019-01-03: 1 [IU] via SUBCUTANEOUS
  Administered 2019-01-04: 2 [IU] via SUBCUTANEOUS
  Administered 2019-01-04: 3 [IU] via SUBCUTANEOUS

## 2018-12-31 MED ORDER — ORAL CARE MOUTH RINSE
15.0000 mL | Freq: Two times a day (BID) | OROMUCOSAL | Status: DC
  Administered 2018-12-31 – 2019-01-04 (×6): 15 mL via OROMUCOSAL

## 2018-12-31 MED ORDER — INSULIN ASPART PROT & ASPART (70-30 MIX) 100 UNIT/ML ~~LOC~~ SUSP
10.0000 [IU] | Freq: Two times a day (BID) | SUBCUTANEOUS | Status: DC
  Administered 2018-12-31 – 2019-01-04 (×8): 10 [IU] via SUBCUTANEOUS
  Filled 2018-12-31: qty 10

## 2018-12-31 MED ORDER — BUDESONIDE 0.25 MG/2ML IN SUSP: 0.2500 mg | Freq: Two times a day (BID) | RESPIRATORY_TRACT | Status: DC

## 2018-12-31 MED ORDER — ACETAMINOPHEN 325 MG PO TABS
650.0000 mg | ORAL_TABLET | Freq: Four times a day (QID) | ORAL | Status: DC | PRN
  Administered 2018-12-31: 650 mg via ORAL
  Filled 2018-12-31: qty 2

## 2018-12-31 MED ORDER — INSULIN ASPART 100 UNIT/ML ~~LOC~~ SOLN
0.0000 [IU] | Freq: Every day | SUBCUTANEOUS | Status: DC
  Administered 2019-01-01: 2 [IU] via SUBCUTANEOUS

## 2018-12-31 MED ORDER — FUROSEMIDE 10 MG/ML IJ SOLN: 80.0000 mg | Freq: Four times a day (QID) | INTRAMUSCULAR | Status: DC

## 2018-12-31 MED ORDER — ALBUTEROL SULFATE (2.5 MG/3ML) 0.083% IN NEBU: 2.5000 mg | INHALATION_SOLUTION | RESPIRATORY_TRACT | Status: DC | PRN

## 2018-12-31 NOTE — Progress Notes (Addendum)
[facility] TEAM 1 - Stepdown/ICU TEAM  Nicholas Caldwell  MGN:[id number] DOB: 1946-06-16 DOA: 12/30/2018 PCP: [facility]    Brief Narrative:  73yo w/ a hx of combined systolic and diastolic heart failure (91-69% by TTE 2020), paroxysmal A. fib, pulmonary hypertension, nonischemic cardiomyopathy, COPD, and chronic hypoxic respiratory failure on 3 L of oxygen who presented w/ worsening shortness of breath over a couple of days w/ a 10-15 pound weight gain over 2-3 weeks.     Subjective: C/o ongoing sob and swelling of his legs. Does not yet feel he has improved. Denies cp, n/v, or abdom pain.   Assessment & Plan:  Acute on chronic hypercarbic/hypoxemic respiratory failure Chronically on 3 L oxygen - presently requiring only 4L support - cont to diurese as able    Acute on chronic combined systolic and diastolic heart failure exacerbation Presented with >10 pound weight gain over a period of 3 weeks - reports compliance w/ meds - suspect dietary noncompliance - cont diuresis - consult CHF Team if he doesn't respond to conservative measures   Paroxysmal A. Fib continue Xarelto - previously failed amiodarone due to markedly prolonged QT - not tikosyn candidate for same reason - NSR presently   COPD continue nebulizer treatment, Symbicort  Chronic pain, anxiety Continue with oxycodone, and Valium as per home doses   Obesity - Body mass index is 38.42 kg/m.   DM Follow CBG and SSI   DVT prophylaxis: Xarelto  Code Status: FULL CODE Family Communication: no family present at time of exam  Disposition Plan: SDU  Consultants:  none  Antimicrobials:  none   Objective: Blood pressure 114/63, pulse 78, temperature 97.7 F (36.5 C), temperature source Oral, resp. rate (!) 22, height 6' (1.829 m), weight 128.5 kg, SpO2 94 %.  Intake/Output Summary (Last 24 hours) at 12/31/2018 1120 Last data filed at 12/31/2018 0002 Gross per 24 hour  Intake -  Output 700 ml  Net  -700 ml   Filed Weights   12/30/18 1608 12/30/18 1840 12/31/18 0404  Weight: 127 kg 130.1 kg 128.5 kg    Examination: General: No acute respiratory distress Lungs: course crackles th/o - mild exp wheezing diffusely Cardiovascular: Regular rate and rhythm without murmur  Abdomen: Nontender, obses, soft, bowel sounds positive, no rebound Extremities: No significant cyanosis, clubbing, or edema bilateral lower extremities  CBC: Recent Labs  Lab 12/30/18 1621 12/30/18 1639 12/31/18 0245  WBC 7.9  --  7.6  NEUTROABS 6.2  --   --   HGB 9.0* 10.2* 8.7*  HCT 32.3* 30.0* 31.0*  MCV 92.6  --  91.2  PLT 481*  --  450*   Basic Metabolic Panel: Recent Labs  Lab 12/30/18 1621 12/30/18 1639 12/30/18 1742 12/31/18 0245  NA 139 137  --  139  K 4.9 4.7  --  4.5  CL 96*  --   --  96*  CO2 32  --   --  36*  GLUCOSE 147*  --   --  68*  BUN 25*  --   --  24*  CREATININE 1.20  --   --  1.16  CALCIUM 8.0*  --   --  8.1*  MG  --   --  2.1  --    GFR: Estimated Creatinine Clearance: 82.1 mL/min (by C-G formula based on SCr of 1.16 mg/dL).  Liver Function Tests: Recent Labs  Lab 12/30/18 1621  AST 20  ALT 12  ALKPHOS 97  BILITOT 0.3  PROT 6.6  ALBUMIN 3.1*    Coagulation Profile: Recent Labs  Lab 12/30/18 1621  INR 1.6*    Cardiac Enzymes: No results for input(s): CKTOTAL, CKMB, CKMBINDEX, TROPONINI in the last 168 hours.  HbA1C: Hgb A1c MFr Bld  Date/Time Value Ref Range Status  11/13/2018 06:02 AM 6.5 (H) 4.8 - 5.6 % Final    Comment:    (NOTE) Pre diabetes:          5.7%-6.4% Diabetes:              >6.4% Glycemic control for   <7.0% adults with diabetes   05/01/2018 05:10 AM 8.1 (H) 4.8 - 5.6 % Final    Comment:    (NOTE) Pre diabetes:          5.7%-6.4% Diabetes:              >6.4% Glycemic control for   <7.0% adults with diabetes     CBG: Recent Labs  Lab 12/30/18 2111 12/31/18 0535  GLUCAP 173* 81    Recent Results (from the past 240  hour(s))  MRSA PCR Screening     Status: None   Collection Time: 12/30/18  6:53 PM  Result Value Ref Range Status   MRSA by PCR NEGATIVE NEGATIVE Final    Comment:        The GeneXpert MRSA Assay (FDA approved for NASAL specimens only), is one component of a comprehensive MRSA colonization surveillance program. It is not intended to diagnose MRSA infection nor to guide or monitor treatment for MRSA infections. Performed at [facility], [place] [address] [place], [place] [address]      Scheduled Meds: . folic acid  1 mg Oral Daily  . furosemide  40 mg Intravenous Once  . furosemide  80 mg Intravenous Q12H  . gabapentin  600 mg Oral BID  . insulin aspart protamine- aspart  15 Units Subcutaneous BID WC  . ipratropium-albuterol  3 mL Nebulization Q6H  . isosorbide-hydrALAZINE  1 tablet Oral TID  . latanoprost  1 drop Both Eyes QHS  . magnesium oxide  400 mg Oral BID  . mouth rinse  15 mL Mouth Rinse BID  . mometasone-formoterol  2 puff Inhalation BID  . potassium chloride SA  20 mEq Oral Daily  . rivaroxaban  20 mg Oral q1800  . rosuvastatin  20 mg Oral q1800     LOS: 1 day   Nicholas Altes, MD [facility] Office  [phone] Pager - Text Page per Amion  If 7PM-7AM, please contact night-coverage per Amion 12/31/2018, 11:20 AM

## 2018-12-31 NOTE — Progress Notes (Signed)
Patient setup and placed on CPAP for the night. Full facemask used with auto setting for CPAP. 4L oxygen bleeding in at this time. Patient resting comfortably and will call if any issues arise.

## 2019-01-01 LAB — COMPREHENSIVE METABOLIC PANEL
ALK PHOS: 113 U/L (ref 38–126)
ALT: 12 U/L (ref 0–44)
AST: 18 U/L (ref 15–41)
Albumin: 3 g/dL — ABNORMAL LOW (ref 3.5–5.0)
Anion gap: 11 (ref 5–15)
BUN: 25 mg/dL — AB (ref 8–23)
CO2: 34 mmol/L — ABNORMAL HIGH (ref 22–32)
Calcium: 8 mg/dL — ABNORMAL LOW (ref 8.9–10.3)
Chloride: 95 mmol/L — ABNORMAL LOW (ref 98–111)
Creatinine, Ser: 1.19 mg/dL (ref 0.61–1.24)
GFR calc Af Amer: >60 mL/min (ref >60)
GFR calc non Af Amer: >60 mL/min (ref >60)
Glucose, Bld: 119 mg/dL — ABNORMAL HIGH (ref 70–99)
Potassium: 3.7 mmol/L (ref 3.5–5.1)
Sodium: 140 mmol/L (ref 135–145)
Total Bilirubin: 0.4 mg/dL (ref 0.3–1.2)
Total Protein: 6.4 g/dL — ABNORMAL LOW (ref 6.5–8.1)

## 2019-01-01 LAB — GLUCOSE, CAPILLARY
GLUCOSE-CAPILLARY: 228 mg/dL — AB (ref 70–99)
Glucose-Capillary: 128 mg/dL — ABNORMAL HIGH (ref 70–99)
Glucose-Capillary: 135 mg/dL — ABNORMAL HIGH (ref 70–99)
Glucose-Capillary: 149 mg/dL — ABNORMAL HIGH (ref 70–99)

## 2019-01-01 LAB — CBC
HCT: 29.6 % — ABNORMAL LOW (ref 39.0–52.0)
Hemoglobin: 8.3 g/dL — ABNORMAL LOW (ref 13.0–17.0)
MCH: 25.2 pg — ABNORMAL LOW (ref 26.0–34.0)
MCHC: 28 g/dL — ABNORMAL LOW (ref 30.0–36.0)
MCV: 90 fL (ref 80.0–100.0)
PLATELETS: 459 10*3/uL — AB (ref 150–400)
RBC: 3.29 MIL/uL — ABNORMAL LOW (ref 4.22–5.81)
RDW: 18 % — ABNORMAL HIGH (ref 11.5–15.5)
WBC: 7.8 10*3/uL (ref 4.0–10.5)
nRBC: 0.6 % — ABNORMAL HIGH (ref 0.0–0.2)

## 2019-01-01 LAB — MAGNESIUM: Magnesium: 2.3 mg/dL (ref 1.7–2.4)

## 2019-01-01 MED ORDER — ALUM & MAG HYDROXIDE-SIMETH 200-200-20 MG/5ML PO SUSP
15.0000 mL | ORAL | Status: DC | PRN
  Administered 2019-01-01: 30 mL via ORAL
  Filled 2019-01-01 (×2): qty 30

## 2019-01-01 MED ORDER — POTASSIUM CHLORIDE CRYS ER 20 MEQ PO TBCR
20.0000 meq | EXTENDED_RELEASE_TABLET | Freq: Two times a day (BID) | ORAL | Status: DC
  Administered 2019-01-01 – 2019-01-03 (×6): 20 meq via ORAL
  Filled 2019-01-01 (×6): qty 1

## 2019-01-01 MED ORDER — FAMOTIDINE 20 MG PO TABS
20.0000 mg | ORAL_TABLET | Freq: Two times a day (BID) | ORAL | Status: DC | PRN
  Administered 2019-01-01 (×3): 20 mg via ORAL
  Filled 2019-01-01 (×3): qty 1

## 2019-01-01 NOTE — Progress Notes (Signed)
Pt refusing CPAP for tonight. I told him to call if he changes his mind. RT will continue to monitor

## 2019-01-01 NOTE — Progress Notes (Addendum)
[place] TEAM 1 - Stepdown/ICU TEAM  Nicholas Caldwell  QMG:[id number] DOB: Apr 22, 1946 DOA: 12/30/2018 PCP: [facility]    Brief Narrative:  73yo w/ a hx of combined systolic and diastolic heart failure (89-16% by TTE 2020), paroxysmal A. fib, pulmonary hypertension, nonischemic cardiomyopathy, COPD, and chronic hypoxic respiratory failure on 3 L of oxygen who presented w/ worsening shortness of breath over a couple of days w/ a 10-15 pound weight gain over 2-3 weeks.    Subjective: Resting comfortably. No resp distress or uncontrolled pain at this time.   Assessment & Plan:  Acute on chronic hypercarbic/hypoxemic respiratory failure Chronically on 3 L oxygen - wean down to 3L today and follow   Acute on chronic combined systolic and diastolic heart failure exacerbation Presented with >10 pound weight gain over a period of 3 weeks - reports compliance w/ meds - suspect dietary noncompliance - cont diuresis - consult CHF Team if he doesn't respond to conservative measures - appears to be improving w/ simple diuresis at this time - dry weight as low as 119kg  Filed Weights   12/30/18 1840 12/31/18 0404 01/01/19 0328  Weight: 130.1 kg 128.5 kg 127.6 kg    Paroxysmal A. Fib continue Xarelto - previously failed amiodarone due to markedly prolonged QT - not tikosyn candidate for same reason - currently in Afib but rate controlled    COPD continue nebulizer treatment, Symbicort  Chronic pain, anxiety Continue with oxycodone and valium as per home doses   Obesity - Body mass index is 38.15 kg/m.   DM CBG currently well controlled    DVT prophylaxis: Xarelto  Code Status: FULL CODE Family Communication: no family present at time of exam  Disposition Plan: stable for tele bed - begin PT/OT   Consultants:  none  Antimicrobials:  none   Objective: Blood pressure (!) 96/56, pulse 72, temperature 98.9 F (37.2 C), temperature source Oral, resp. rate 17, height 6'  (1.829 m), weight 127.6 kg, SpO2 97 %.  Intake/Output Summary (Last 24 hours) at 01/01/2019 1341 Last data filed at 01/01/2019 1200 Gross per 24 hour  Intake 500 ml  Output 5275 ml  Net -4775 ml   Filed Weights   12/30/18 1840 12/31/18 0404 01/01/19 0328  Weight: 130.1 kg 128.5 kg 127.6 kg    Examination: General: No acute respiratory distress Lungs: fine scattered crackles - no wheezing  Cardiovascular: irreg irreg - rate 80 - no M or rub  Abdomen: Nontender, obses, soft, bowel sounds positive, no rebound Extremities: 2+ B LE edema   CBC: Recent Labs  Lab 12/30/18 1621 12/30/18 1639 12/31/18 0245 01/01/19 0246  WBC 7.9  --  7.6 7.8  NEUTROABS 6.2  --   --   --   HGB 9.0* 10.2* 8.7* 8.3*  HCT 32.3* 30.0* 31.0* 29.6*  MCV 92.6  --  91.2 90.0  PLT 481*  --  448* 945*   Basic Metabolic Panel: Recent Labs  Lab 12/30/18 1621 12/30/18 1639 12/30/18 1742 12/31/18 0245 01/01/19 0246  NA 139 137  --  139 140  K 4.9 4.7  --  4.5 3.7  CL 96*  --   --  96* 95*  CO2 32  --   --  36* 34*  GLUCOSE 147*  --   --  68* 119*  BUN 25*  --   --  24* 25*  CREATININE 1.20  --   --  1.16 1.19  CALCIUM 8.0*  --   --  8.1* 8.0*  MG  --   --  2.1  --  2.3   GFR: Estimated Creatinine Clearance: 79.7 mL/min (by C-G formula based on SCr of 1.19 mg/dL).  Liver Function Tests: Recent Labs  Lab 12/30/18 1621 01/01/19 0246  AST 20 18  ALT 12 12  ALKPHOS 97 113  BILITOT 0.3 0.4  PROT 6.6 6.4*  ALBUMIN 3.1* 3.0*    Coagulation Profile: Recent Labs  Lab 12/30/18 1621  INR 1.6*   HbA1C: Hgb A1c MFr Bld  Date/Time Value Ref Range Status  11/13/2018 06:02 AM 6.5 (H) 4.8 - 5.6 % Final    Comment:    (NOTE) Pre diabetes:          5.7%-6.4% Diabetes:              >6.4% Glycemic control for   <7.0% adults with diabetes   05/01/2018 05:10 AM 8.1 (H) 4.8 - 5.6 % Final    Comment:    (NOTE) Pre diabetes:          5.7%-6.4% Diabetes:              >6.4% Glycemic control for    <7.0% adults with diabetes     CBG: Recent Labs  Lab 12/31/18 1154 12/31/18 1704 12/31/18 2122 01/01/19 0701 01/01/19 1201  GLUCAP 100* 147* 174* 128* 135*    Recent Results (from the past 240 hour(s))  MRSA PCR Screening     Status: None   Collection Time: 12/30/18  6:53 PM  Result Value Ref Range Status   MRSA by PCR NEGATIVE NEGATIVE Final    Comment:        The GeneXpert MRSA Assay (FDA approved for NASAL specimens only), is one component of a comprehensive MRSA colonization surveillance program. It is not intended to diagnose MRSA infection nor to guide or monitor treatment for MRSA infections. Performed at [facility], [place] [address], [place], [place] [address]      Scheduled Meds: . folic acid  1 mg Oral Daily  . furosemide  80 mg Intravenous Q8H  . gabapentin  600 mg Oral BID  . insulin aspart  0-5 Units Subcutaneous QHS  . insulin aspart  0-9 Units Subcutaneous TID WC  . insulin aspart protamine- aspart  10 Units Subcutaneous BID WC  . ipratropium-albuterol  3 mL Nebulization Q6H  . isosorbide-hydrALAZINE  1 tablet Oral TID  . latanoprost  1 drop Both Eyes QHS  . magnesium oxide  400 mg Oral BID  . mouth rinse  15 mL Mouth Rinse BID  . metolazone  5 mg Oral Daily  . mometasone-formoterol  2 puff Inhalation BID  . potassium chloride SA  20 mEq Oral Daily  . rivaroxaban  20 mg Oral q1800  . rosuvastatin  20 mg Oral q1800     LOS: 2 days   Cherene Altes, MD [facility] Office  [phone] Pager - Text Page per Amion  If 7PM-7AM, please contact night-coverage per Amion 01/01/2019, 1:41 PM

## 2019-01-02 ENCOUNTER — Inpatient Hospital Stay (HOSPITAL_COMMUNITY): Payer: Medicare Other

## 2019-01-02 LAB — GLUCOSE, CAPILLARY
Glucose-Capillary: 129 mg/dL — ABNORMAL HIGH (ref 70–99)
Glucose-Capillary: 137 mg/dL — ABNORMAL HIGH (ref 70–99)
Glucose-Capillary: 126 mg/dL — ABNORMAL HIGH (ref 70–99)
Glucose-Capillary: 157 mg/dL — ABNORMAL HIGH (ref 70–99)

## 2019-01-02 LAB — PROCALCITONIN: Procalcitonin: <0.1 ng/mL

## 2019-01-02 LAB — BLOOD GAS, VENOUS
Acid-Base Excess: 18.5 mmol/L — ABNORMAL HIGH (ref 0.0–2.0)
Bicarbonate: 44.7 mmol/L — ABNORMAL HIGH (ref 20.0–28.0)
O2 Saturation: 62.3 %
PH VEN: 7.374 (ref 7.250–7.430)
Patient temperature: 98.6
pCO2, Ven: 78.6 mmHg (ref 44.0–60.0)
pO2, Ven: 35.9 mmHg (ref 32.0–45.0)

## 2019-01-02 LAB — HEPATIC FUNCTION PANEL
ALBUMIN: 2.9 g/dL — AB (ref 3.5–5.0)
ALT: 13 U/L (ref 0–44)
AST: 17 U/L (ref 15–41)
Alkaline Phosphatase: 110 U/L (ref 38–126)
Bilirubin, Direct: <0.1 mg/dL (ref 0.0–0.2)
Total Bilirubin: 0.2 mg/dL — ABNORMAL LOW (ref 0.3–1.2)
Total Protein: 6.5 g/dL (ref 6.5–8.1)

## 2019-01-02 LAB — BASIC METABOLIC PANEL
ANION GAP: 13 (ref 5–15)
BUN: 23 mg/dL (ref 8–23)
CO2: 39 mmol/L — ABNORMAL HIGH (ref 22–32)
Calcium: 8.2 mg/dL — ABNORMAL LOW (ref 8.9–10.3)
Chloride: 91 mmol/L — ABNORMAL LOW (ref 98–111)
Creatinine, Ser: 1.23 mg/dL (ref 0.61–1.24)
GFR calc Af Amer: >60 mL/min (ref >60)
GFR calc non Af Amer: 59 mL/min — ABNORMAL LOW (ref >60)
GLUCOSE: 139 mg/dL — AB (ref 70–99)
Potassium: 3.3 mmol/L — ABNORMAL LOW (ref 3.5–5.1)
Sodium: 143 mmol/L (ref 135–145)

## 2019-01-02 LAB — MAGNESIUM: Magnesium: 2.5 mg/dL — ABNORMAL HIGH (ref 1.7–2.4)

## 2019-01-02 LAB — CBC
HCT: 28.7 % — ABNORMAL LOW (ref 39.0–52.0)
Hemoglobin: 8.3 g/dL — ABNORMAL LOW (ref 13.0–17.0)
MCH: 25.9 pg — ABNORMAL LOW (ref 26.0–34.0)
MCHC: 28.9 g/dL — ABNORMAL LOW (ref 30.0–36.0)
MCV: 89.7 fL (ref 80.0–100.0)
Platelets: 438 10*3/uL — ABNORMAL HIGH (ref 150–400)
RBC: 3.2 MIL/uL — ABNORMAL LOW (ref 4.22–5.81)
RDW: 18 % — ABNORMAL HIGH (ref 11.5–15.5)
WBC: 9.8 10*3/uL (ref 4.0–10.5)
nRBC: 0.3 % — ABNORMAL HIGH (ref 0.0–0.2)

## 2019-01-02 LAB — LIPASE, BLOOD: Lipase: 26 U/L (ref 11–51)

## 2019-01-02 MED ORDER — FAMOTIDINE 20 MG PO TABS
20.0000 mg | ORAL_TABLET | Freq: Once | ORAL | Status: AC
  Administered 2019-01-02: 20 mg via ORAL
  Filled 2019-01-02: qty 1

## 2019-01-02 MED ORDER — IOHEXOL 300 MG/ML  SOLN
125.0000 mL | Freq: Once | INTRAMUSCULAR | Status: AC | PRN
  Administered 2019-01-02: 125 mL via INTRAVENOUS

## 2019-01-02 MED ORDER — DICYCLOMINE HCL 20 MG PO TABS
20.0000 mg | ORAL_TABLET | Freq: Three times a day (TID) | ORAL | Status: DC
  Administered 2019-01-02 – 2019-01-04 (×8): 20 mg via ORAL
  Filled 2019-01-02 (×8): qty 1

## 2019-01-02 MED ORDER — SODIUM CHLORIDE 0.9 % IV SOLN: 1.0000 g | Freq: Three times a day (TID) | INTRAVENOUS | Status: DC

## 2019-01-02 MED ORDER — POTASSIUM CHLORIDE CRYS ER 20 MEQ PO TBCR
40.0000 meq | EXTENDED_RELEASE_TABLET | Freq: Once | ORAL | Status: AC
  Administered 2019-01-02: 40 meq via ORAL
  Filled 2019-01-02: qty 2

## 2019-01-02 MED ORDER — IPRATROPIUM-ALBUTEROL 0.5-2.5 (3) MG/3ML IN SOLN
3.0000 mL | Freq: Four times a day (QID) | RESPIRATORY_TRACT | Status: DC
  Administered 2019-01-02 (×2): 3 mL via RESPIRATORY_TRACT
  Filled 2019-01-02 (×2): qty 3

## 2019-01-02 MED ORDER — POLYETHYLENE GLYCOL 3350 17 G PO PACK
17.0000 g | PACK | Freq: Every day | ORAL | Status: DC
  Administered 2019-01-02 – 2019-01-04 (×2): 17 g via ORAL
  Filled 2019-01-02 (×3): qty 1

## 2019-01-02 MED ORDER — PANTOPRAZOLE SODIUM 40 MG PO TBEC
40.0000 mg | DELAYED_RELEASE_TABLET | Freq: Every day | ORAL | Status: DC
  Administered 2019-01-02: 40 mg via ORAL
  Filled 2019-01-02 (×2): qty 1

## 2019-01-02 MED ORDER — IPRATROPIUM-ALBUTEROL 0.5-2.5 (3) MG/3ML IN SOLN
3.0000 mL | Freq: Three times a day (TID) | RESPIRATORY_TRACT | Status: DC
  Administered 2019-01-03 – 2019-01-04 (×5): 3 mL via RESPIRATORY_TRACT
  Filled 2019-01-02 (×5): qty 3

## 2019-01-02 NOTE — Progress Notes (Signed)
CRITICAL VALUE ALERT  Critical Value:  ABG: Ph-7.37                                   Co2- 79                                   Po2-36                                   Bi-Carb- 44  Date & Time Notied:  01/02/2019 1934  Provider Notified: Dr. Jeannette Corpus  Orders Received/Actions taken: MD called back waiting for orders.

## 2019-01-02 NOTE — Plan of Care (Signed)
  Problem: Education: Goal: Knowledge of General Education information will improve Description Including pain rating scale, medication(s)/side effects and non-pharmacologic comfort measures Outcome: Progressing   Problem: Health Behavior/Discharge Planning: Goal: Ability to manage health-related needs will improve Outcome: Progressing   Problem: Clinical Measurements: Goal: Ability to maintain clinical measurements within normal limits will improve Outcome: Progressing Goal: Will remain free from infection Outcome: Progressing Goal: Diagnostic test results will improve Outcome: Progressing Goal: Respiratory complications will improve Outcome: Progressing Goal: Cardiovascular complication will be avoided Outcome: Progressing   Problem: Activity: Goal: Risk for activity intolerance will decrease Outcome: Progressing   Problem: Nutrition: Goal: Adequate nutrition will be maintained Outcome: Progressing   Problem: Coping: Goal: Level of anxiety will decrease Outcome: Progressing   Problem: Elimination: Goal: Will not experience complications related to bowel motility Outcome: Progressing Goal: Will not experience complications related to urinary retention Outcome: Progressing   Problem: Pain Managment: Goal: General experience of comfort will improve Outcome: Progressing   Problem: Safety: Goal: Ability to remain free from injury will improve Outcome: Progressing   Problem: Skin Integrity: Goal: Risk for impaired skin integrity will decrease Outcome: Progressing

## 2019-01-02 NOTE — Progress Notes (Signed)
PROGRESS NOTE    Nicholas Caldwell  IFB:[id number] DOB: 1946-04-21 DOA: 12/30/2018 PCP: [facility]   Brief Narrative:  [phone] w/ Nicholas Caldwell hx of combined systolic and diastolic heart failure (29-57% by TTE 2020),paroxysmal Nicholas Caldwell. fib, pulmonary hypertension, nonischemic cardiomyopathy, COPD, and chronic hypoxic respiratory failure on 3 L of oxygen who presented w/ worsening shortness of breath over Nicholas Caldwell couple of days w/ Nicholas Caldwell 10-15 pound weight gain over 2-3 weeks.   Assessment & Plan:   Active Problems:   Essential hypertension   Type 2 diabetes mellitus with neuropathy   PAF (paroxysmal atrial fibrillation) (HCC)   Nonischemic cardiomyopathy (HCC)   CAD - Non-obstructive by LHC1/16   Acute on chronic combined systolic and diastolic CHF (congestive heart failure) (HCC)   Heart failure (HCC)   Acute on chronic hypercarbic/hypoxemic respiratory failure Chronically on 3 L oxygen Diuresing as noted below Follow repeat CXR today Schedule nebs for COPD below as some wheezing on exam today  Acute on chronic combined systolic and diastolic heart failure exacerbation Dry weight he thinks is around 268 lbs when he feels good (~122 kg).  Weight at discharge during last hospitalization was 122.8 kg Weight is 132.5 kg today Suspected cause is dietary noncompliance, he reported compliance with meds On torsemide 80 BID at home Getting lasix 80 TID here and was also on metolazone daily.  Will stop metolazone today, continue lasix. What appears to be contraction alkalosis developing, but appears overloaded still Negative 3.6 L yesterday, -9 L over hospitalization Weight increasing (not sure this is accurate given I/O) Repeat CXR today Consider cardiology/CHF consult if difficult to diurese  Pulmonary HTN: suspected mixed pulmonary venous HTN and PAH from COPD due to COPD on cath from 2/3.  Needs outpatient sleep study.    Metabolic Alkalosis: contraction vs compensation from chronic respiratory  acidosis?  Follow venous blood gas.  Paroxysmal Nicholas Caldwell. Fib continue Xarelto - previously failed amiodarone due to markedly prolonged QT - not tikosyn candidate for same reason - NSR presently   COPD continue nebulizer treatment, Symbicort Schedule nebs [name] outpatient sleep study  Chronic pain, anxiety Continue with oxycodone, and Valium as per home doses   Obesity - Body mass index is 38.42 kg/m.   DM Follow CBG and SSI Continue 70/30 10 units BID (caution if pt NPO at any time)  Hypokalemia: follow mag, replace  Abdominal Discomfort: c/o pain that he describes as like ulcer.  Transition from H2 blocker to PPI.  Add bentyl.  Unclear etiology, follow lipase, LFT's.  Consider imaging if persistent or worsening.  No BM in several days, add bowel regimen.  DVT prophylaxis: xarelto Code Status: full  Family Communication: wife at bedside Disposition Plan: pending improvement in respiratory symptoms   Consultants:   none  Procedures:   none  Antimicrobials:  Anti-infectives (From admission, onward)   None      Subjective: C/o feeling poorly  SOB.   C/o pain that he describes as like an ulcer.  Concerned about infection b/c they talked about that last time. Feels good around 268 lbs.  Objective: Vitals:   01/01/19 2300 01/02/19 0300 01/02/19 0713 01/02/19 0718  BP: (!) 109/49 (!) 105/48    Pulse: 87 79 86   Resp: (!) _0 Temp: 98.6 F (37 C) 98.1 F (36.7 C)    TempSrc: Oral Oral    SpO2:  97% 97% 97%  Weight:  132.5 kg    Height:  Intake/Output Summary (Last 24 hours) at 01/02/2019 0835 Last data filed at 01/02/2019 0300 Gross per 24 hour  Intake 1180 ml  Output 4850 ml  Net -3670 ml   Filed Weights   12/31/18 0404 01/01/19 0328 01/02/19 0300  Weight: 128.5 kg 127.6 kg 132.5 kg    Examination:  General exam: Appears calm and comfortable  Respiratory system: Diffusely coarse and rhonchorous breath sounds, scattered wheezing.   Difficult exam as pt unable to sit up.  Cardiovascular system: Irregularly irregular.  Difficult JVD exam due to body habitus. Gastrointestinal system: Abdomen is protuberant, soft and nontender.  Central nervous system: Alert and oriented. No focal neurological deficits. Extremities: Dependent edema, not much lower extremity edema, but sacral edema noted Skin: No rashes, lesions or ulcers Psychiatry: Judgement and insight appear normal. Mood & affect appropriate.     Data Reviewed: I have personally reviewed following labs and imaging studies  CBC: Recent Labs  Lab 12/30/18 1621 12/30/18 1639 12/31/18 0245 01/01/19 0246  WBC 7.9  --  7.6 7.8  NEUTROABS 6.2  --   --   --   HGB 9.0* 10.2* 8.7* 8.3*  HCT 32.3* 30.0* 31.0* 29.6*  MCV 92.6  --  91.2 90.0  PLT 481*  --  448* 939*   Basic Metabolic Panel: Recent Labs  Lab 12/30/18 1621 12/30/18 1639 12/30/18 1742 12/31/18 0245 01/01/19 0246 01/02/19 0239  NA 139 137  --  139 140 143  K 4.9 4.7  --  4.5 3.7 3.3*  CL 96*  --   --  96* 95* 91*  CO2 32  --   --  36* 34* 39*  GLUCOSE 147*  --   --  68* 119* 139*  BUN 25*  --   --  24* 25* 23  CREATININE 1.20  --   --  1.16 1.19 1.23  CALCIUM 8.0*  --   --  8.1* 8.0* 8.2*  MG  --   --  2.1  --  2.3 2.5*   GFR: Estimated Creatinine Clearance: 78.7 mL/min (by C-G formula based on SCr of 1.23 mg/dL). Liver Function Tests: Recent Labs  Lab 12/30/18 1621 01/01/19 0246  AST 20 18  ALT 12 12  ALKPHOS 97 113  BILITOT 0.3 0.4  PROT 6.6 6.4*  ALBUMIN 3.1* 3.0*   No results for input(s): LIPASE, AMYLASE in the last 168 hours. No results for input(s): AMMONIA in the last 168 hours. Coagulation Profile: Recent Labs  Lab 12/30/18 1621  INR 1.6*   Cardiac Enzymes: No results for input(s): CKTOTAL, CKMB, CKMBINDEX, TROPONINI in the last 168 hours. BNP (last 3 results) No results for input(s): PROBNP in the last 8760 hours. HbA1C: No results for input(s): HGBA1C in the last  72 hours. CBG: Recent Labs  Lab 01/01/19 0701 01/01/19 1201 01/01/19 1634 01/01/19 2153 01/02/19 0601  GLUCAP 128* 135* 149* 228* 129*   Lipid Profile: No results for input(s): CHOL, HDL, LDLCALC, TRIG, CHOLHDL, LDLDIRECT in the last 72 hours. Thyroid Function Tests: No results for input(s): TSH, T4TOTAL, FREET4, T3FREE, THYROIDAB in the last 72 hours. Anemia Panel: No results for input(s): VITAMINB12, FOLATE, FERRITIN, TIBC, IRON, RETICCTPCT in the last 72 hours. Sepsis Labs: No results for input(s): PROCALCITON, LATICACIDVEN in the last 168 hours.  Recent Results (from the past 240 hour(s))  MRSA PCR Screening     Status: None   Collection Time: 12/30/18  6:53 PM  Result Value Ref Range Status   MRSA by PCR  NEGATIVE NEGATIVE Final    Comment:        The GeneXpert MRSA Assay (FDA approved for NASAL specimens only), is one component of Ma[facility] comprehensive MRSA colonization surveillance program. It is not intended to diagnose MRSA infection nor to guide or monitor treatment for MRSA infections. Performed at [facility], [place] [address] [place], [place] [address]          Radiology Studies: No results found.      Scheduled Meds: . folic acid  1 mg Oral Daily  . furosemide  80 mg Intravenous Q8H  . gabapentin  600 mg Oral BID  . insulin aspart  0-5 Units Subcutaneous QHS  . insulin aspart  0-9 Units Subcutaneous TID WC  . insulin aspart protamine- aspart  10 Units Subcutaneous BID WC  . ipratropium-albuterol  3 mL Nebulization Q6H  . isosorbide-hydrALAZINE  1 tablet Oral TID  . latanoprost  1 drop Both Eyes QHS  . magnesium oxide  400 mg Oral BID  . mouth rinse  15 mL Mouth Rinse BID  . mometasone-formoterol  2 puff Inhalation BID  . potassium chloride  20 mEq Oral BID  . potassium chloride  40 mEq Oral Once  . rivaroxaban  20 mg Oral q1800  . rosuvastatin  20 mg Oral q1800   Continuous Infusions:   LOS: 3 days    Time spent: over 30  min    Fayrene Helper, MD [facility] Pager AMION  If 7PM-7AM, please contact night-coverage [email or web address] Password [facility] 01/02/2019, 8:35 AM

## 2019-01-02 NOTE — Evaluation (Signed)
Physical Therapy Evaluation Patient Details Name: Nicholas Caldwell MRN: [id number] DOB: Jul 08, 1946 Today's Date: 01/02/2019   History of Present Illness  Pt is a 73 y.o. M with significant PMH of combined systolic and diastolic heart failure, a. fib, pulmonary hypertension, nonischemic cardiomyoathy, COPD, and chronic hypoxic respiratory failure on 3L O2 who presents with worsening shortness of breath associated with 10-15 pound weight gain.  Clinical Impression  Pt admitted with above diagnosis. Pt currently with functional limitations due to the deficits listed below (see PT Problem List). Prior to admission, pt lives with his wife and requires assist for ADL's. Uses walker for limited household mobility. On PT evaluation, pt presents with poor activity tolerance, weakness, and decreased cardiovascular endurance. Requiring up to moderate assistance for bed mobility. Transfers deferred by pt today. Focused on seated exercises for strengthening. SpO2 89-97% on 4L O2, HR 90's. Pt will benefit from skilled PT to increase their independence and safety with mobility to allow discharge to the venue listed below.       Follow Up Recommendations Home health PT;Supervision/Assistance - 24 hour (pending progress)    Equipment Recommendations  None recommended by PT    Recommendations for Other Services       Precautions / Restrictions Precautions Precautions: Fall Precaution Comments: 3-4L O2 [place] baseline Restrictions Weight Bearing Restrictions: No      Mobility  Bed Mobility Overal bed mobility: Needs Assistance Bed Mobility: Supine to Sit;Sit to Supine     Supine to sit: Supervision Sit to supine: Mod assist   General bed mobility comments: modA for BLE negotiation back into bed  Transfers                 General transfer comment: deferred by pt   Ambulation/Gait                Stairs            Wheelchair Mobility    Modified Rankin (Stroke Patients Only)        Balance Overall balance assessment: Needs assistance Sitting-balance support: Feet supported;Bilateral upper extremity supported Sitting balance-Leahy Scale: Fair                                       Pertinent Vitals/Pain Pain Assessment: Faces Faces Pain Scale: Hurts little more Pain Location: right hip Pain Descriptors / Indicators: Discomfort;Grimacing;Constant Pain Intervention(s): Monitored during session;Patient requesting pain meds-RN notified    Home Living Family/patient expects to be discharged to:: Private residence Living Arrangements: Spouse/significant other Available Help at Discharge: Family;Available 24 hours/day Type of Home: House Home Access: Stairs to enter Entrance Stairs-Rails: None Entrance Stairs-Number of Steps: 1 Home Layout: One level Home Equipment: Walker - 2 wheels;Cane - single point;Bedside commode;Shower seat Additional Comments: on 3L of O2 at all times at home    Prior Function Level of Independence: Needs assistance   Gait / Transfers Assistance Needed: uses RW for mobility  ADL's / Homemaking Assistance Needed: assist from wife for LB ADLs and IADLs        Hand Dominance   Dominant Hand: Right    Extremity/Trunk Assessment   Upper Extremity Assessment Upper Extremity Assessment: Generalized weakness    Lower Extremity Assessment Lower Extremity Assessment: Generalized weakness    Cervical / Trunk Assessment Cervical / Trunk Assessment: Other exceptions Cervical / Trunk Exceptions: Increased body habitus  Communication   Communication: No difficulties  Cognition Arousal/Alertness: Awake/alert Behavior During Therapy: WFL for tasks assessed/performed Overall Cognitive Status: Within Functional Limits for tasks assessed                                        General Comments      Exercises General Exercises - Lower Extremity Long Arc Quad: 10 reps;Both;Seated Hip  ABduction/ADduction: 10 reps;Both;Seated Hip Flexion/Marching: 10 reps;Both;Seated   Assessment/Plan    PT Assessment Patient needs continued PT services  PT Problem List Decreased strength;Decreased activity tolerance;Decreased balance;Decreased coordination;Decreased mobility;Decreased knowledge of use of DME;Decreased safety awareness;Decreased knowledge of precautions;Pain       PT Treatment Interventions DME instruction;Gait training;Stair training;Functional mobility training;Therapeutic activities;Balance training;Therapeutic exercise;Neuromuscular re-education;Patient/family education    PT Goals (Current goals can be found in the Care Plan section)  Acute Rehab PT Goals Patient Stated Goal: return home PT Goal Formulation: With patient Time For Goal Achievement: 01/16/19 Potential to Achieve Goals: Fair    Frequency Min 3X/week   Barriers to discharge        Co-evaluation               AM-PAC PT "6 Clicks" Mobility  Outcome Measure Help needed turning from your back to your side while in a flat bed without using bedrails?: A Little Help needed moving from lying on your back to sitting on the side of a flat bed without using bedrails?: A Little Help needed moving to and from a bed to a chair (including a wheelchair)?: A Little Help needed standing up from a chair using your arms (e.g., wheelchair or bedside chair)?: A Little Help needed to walk in hospital room?: A Lot Help needed climbing 3-5 steps with a railing? : A Lot 6 Click Score: 16    End of Session Equipment Utilized During Treatment: Oxygen Activity Tolerance: Patient limited by fatigue Patient left: in bed;with call bell/phone within reach;with family/visitor present Nurse Communication: Mobility status PT Visit Diagnosis: Other abnormalities of gait and mobility (R26.89);Muscle weakness (generalized) (M62.81)    Time: 1537-9432 PT Time Calculation (min) (ACUTE ONLY): 21 min   Charges:   PT  Evaluation $PT Eval Moderate Complexity: 1 Mod         Ellamae Sia, [place], DPT Acute Rehabilitation Services Pager [phone] Office [phone]  Willy Eddy 01/02/2019, 5:04 PM

## 2019-01-03 ENCOUNTER — Inpatient Hospital Stay (HOSPITAL_COMMUNITY): Payer: Medicare Other

## 2019-01-03 ENCOUNTER — Encounter (HOSPITAL_COMMUNITY): Payer: Medicare Other

## 2019-01-03 DIAGNOSIS — J9601 Acute respiratory failure with hypoxia: Secondary | ICD-10-CM

## 2019-01-03 DIAGNOSIS — J9602 Acute respiratory failure with hypercapnia: Secondary | ICD-10-CM

## 2019-01-03 DIAGNOSIS — I5043 Acute on chronic combined systolic (congestive) and diastolic (congestive) heart failure: Secondary | ICD-10-CM

## 2019-01-03 LAB — COMPREHENSIVE METABOLIC PANEL
ALBUMIN: 2.9 g/dL — AB (ref 3.5–5.0)
ALT: 11 U/L (ref 0–44)
AST: 16 U/L (ref 15–41)
Alkaline Phosphatase: 103 U/L (ref 38–126)
Anion gap: 10 (ref 5–15)
BUN: 22 mg/dL (ref 8–23)
CALCIUM: 8.2 mg/dL — AB (ref 8.9–10.3)
CO2: 41 mmol/L — AB (ref 22–32)
Chloride: 88 mmol/L — ABNORMAL LOW (ref 98–111)
Creatinine, Ser: 1.35 mg/dL — ABNORMAL HIGH (ref 0.61–1.24)
GFR calc Af Amer: >60 mL/min (ref >60)
GFR calc non Af Amer: 53 mL/min — ABNORMAL LOW (ref >60)
Glucose, Bld: 172 mg/dL — ABNORMAL HIGH (ref 70–99)
Potassium: 3.1 mmol/L — ABNORMAL LOW (ref 3.5–5.1)
Sodium: 139 mmol/L (ref 135–145)
Total Bilirubin: 0.4 mg/dL (ref 0.3–1.2)
Total Protein: 6.2 g/dL — ABNORMAL LOW (ref 6.5–8.1)

## 2019-01-03 LAB — GLUCOSE, CAPILLARY
GLUCOSE-CAPILLARY: 178 mg/dL — AB (ref 70–99)
Glucose-Capillary: 128 mg/dL — ABNORMAL HIGH (ref 70–99)
Glucose-Capillary: 181 mg/dL — ABNORMAL HIGH (ref 70–99)
Glucose-Capillary: 118 mg/dL — ABNORMAL HIGH (ref 70–99)

## 2019-01-03 LAB — CBC
HCT: 28.6 % — ABNORMAL LOW (ref 39.0–52.0)
Hemoglobin: 8.3 g/dL — ABNORMAL LOW (ref 13.0–17.0)
MCH: 25.6 pg — ABNORMAL LOW (ref 26.0–34.0)
MCHC: 29 g/dL — ABNORMAL LOW (ref 30.0–36.0)
MCV: 88.3 fL (ref 80.0–100.0)
Platelets: 430 10*3/uL — ABNORMAL HIGH (ref 150–400)
RBC: 3.24 MIL/uL — ABNORMAL LOW (ref 4.22–5.81)
RDW: 18.2 % — ABNORMAL HIGH (ref 11.5–15.5)
WBC: 9.6 10*3/uL (ref 4.0–10.5)
nRBC: 0.2 % (ref 0.0–0.2)

## 2019-01-03 LAB — C-REACTIVE PROTEIN: CRP: 0.9 mg/dL (ref <1.0)

## 2019-01-03 LAB — PROCALCITONIN: Procalcitonin: 0.1 ng/mL

## 2019-01-03 LAB — MAGNESIUM: Magnesium: 2.4 mg/dL (ref 1.7–2.4)

## 2019-01-03 LAB — LACTATE DEHYDROGENASE: LDH: 304 U/L — ABNORMAL HIGH (ref 98–192)

## 2019-01-03 LAB — SEDIMENTATION RATE: Sed Rate: 49 mm/hr — ABNORMAL HIGH (ref 0–16)

## 2019-01-03 MED ORDER — SPIRONOLACTONE 12.5 MG HALF TABLET
12.5000 mg | ORAL_TABLET | Freq: Every day | ORAL | Status: DC
  Administered 2019-01-03: 12.5 mg via ORAL
  Filled 2019-01-03: qty 1

## 2019-01-03 MED ORDER — FUROSEMIDE 10 MG/ML IJ SOLN
80.0000 mg | Freq: Three times a day (TID) | INTRAMUSCULAR | Status: DC
  Administered 2019-01-03 – 2019-01-04 (×3): 80 mg via INTRAVENOUS
  Filled 2019-01-03 (×3): qty 8

## 2019-01-03 MED ORDER — POTASSIUM CHLORIDE CRYS ER 20 MEQ PO TBCR
40.0000 meq | EXTENDED_RELEASE_TABLET | ORAL | Status: AC
  Administered 2019-01-03 (×2): 40 meq via ORAL
  Filled 2019-01-03 (×2): qty 2

## 2019-01-03 MED ORDER — FAMOTIDINE 20 MG PO TABS
20.0000 mg | ORAL_TABLET | Freq: Once | ORAL | Status: AC
  Administered 2019-01-03: 20 mg via ORAL
  Filled 2019-01-03: qty 1

## 2019-01-03 MED ORDER — FUROSEMIDE 10 MG/ML IJ SOLN: 80.0000 mg | Freq: Two times a day (BID) | INTRAMUSCULAR | Status: DC

## 2019-01-03 MED ORDER — FAMOTIDINE 20 MG PO TABS
20.0000 mg | ORAL_TABLET | Freq: Two times a day (BID) | ORAL | Status: DC
  Administered 2019-01-03 – 2019-01-04 (×3): 20 mg via ORAL
  Filled 2019-01-03 (×3): qty 1

## 2019-01-03 MED ORDER — ACETAZOLAMIDE 250 MG PO TABS
250.0000 mg | ORAL_TABLET | Freq: Two times a day (BID) | ORAL | Status: AC
  Administered 2019-01-03 – 2019-01-04 (×3): 250 mg via ORAL
  Filled 2019-01-03 (×3): qty 1

## 2019-01-03 MED ORDER — POTASSIUM CHLORIDE CRYS ER 20 MEQ PO TBCR: 40.0000 meq | EXTENDED_RELEASE_TABLET | ORAL | Status: DC

## 2019-01-03 NOTE — Progress Notes (Signed)
RT Note: Pt refusing CPAP/Bipap at this time.

## 2019-01-03 NOTE — Evaluation (Signed)
Occupational Therapy Evaluation Patient Details Name: Nicholas Caldwell MRN: [id number] DOB: [birth year] Today's Date: 01/03/2019    History of Present Illness Pt is a 73 y.o. M with significant PMH of combined systolic and diastolic heart failure, a. fib, pulmonary hypertension, nonischemic cardiomyoathy, COPD, and chronic hypoxic respiratory failure on 3L O2 who presents with worsening shortness of breath associated with 10-15 pound weight gain.   Clinical Impression   PTA Pt mod A for LB ADL (wife provides assist), minimal mobility in home with RW. Pt is currently min guard assist for stand pivot transfer to recliner, mod A for LB ADL and requires encouragement for activity OOB. Educated Pt on importance of OOB to prevent generalized weakness and build activity tolerance. Pt did share that the thing he wants to be able to do the most is get up in the middle of the night when he needs something in the kitchen and get it himself without his wife having to do it for him. Pt will benefit from skilled OT in the acute setting and afterwards at the [facility] level to maximize safety and independence in ADL and functional transfers. Next session to focus on AE education for LB ADL (Pt states that he is going to buy AE kit - encouraged him to purchase so we can change out sock donner for bari version, and assist him with training and practicing while he can still ask questions prior to dc).     Follow Up Recommendations  Home health OT;Supervision/Assistance - 24 hour    Equipment Recommendations  None recommended by OT    Recommendations for Other Services       Precautions / Restrictions Precautions Precautions: Fall Precaution Comments: 3-4L O2 [place] baseline Restrictions Weight Bearing Restrictions: No      Mobility Bed Mobility Overal bed mobility: Needs Assistance Bed Mobility: Supine to Sit     Supine to sit: Mod assist     General bed mobility comments: modA for trunk elevation, Pt able to  adjust hips EOB  Transfers Overall transfer level: Needs assistance Equipment used: None Transfers: Sit to/from [facility] Sit to Stand: Min guard;Min assist;From elevated surface Stand pivot transfers: Min guard       General transfer comment: min guard A for balance and safety    Balance Overall balance assessment: Needs assistance Sitting-balance support: Feet supported;Bilateral upper extremity supported Sitting balance-Leahy Scale: Fair     Standing balance support: Single extremity supported Standing balance-Leahy Scale: Poor                             ADL either performed or assessed with clinical judgement   ADL Overall ADL's : Needs assistance/impaired Eating/Feeding: Set up;Sitting   Grooming: Set up;Sitting   Upper Body Bathing: Set up;Sitting   Lower Body Bathing: Moderate assistance;Sit to/from stand   Upper Body Dressing : Set up;Sitting   Lower Body Dressing: Moderate assistance;Sit to/from stand Lower Body Dressing Details (indicate cue type and reason): Pt interested in AE for LB ADL - "the plastic bag kit from the store here" encouraged to purchase, and bring up to room so that we can practice with his equipment Toilet Transfer: Min Statistician Details (indicate cue type and reason): no DME used for simulated transfer to [place] and Hygiene: Min guard;Sit to/from stand       Functional mobility during ADLs: Min guard;Cueing for safety General ADL Comments: pt stating he  would be purchasing AE for LB ADL prior to going home, recalled use from previous OT visit     Vision Baseline Vision/History: Wears glasses Patient Visual Report: No change from baseline       Perception     Praxis      Pertinent Vitals/Pain Pain Assessment: Faces Faces Pain Scale: Hurts little more Pain Location: right hip; back Pain Descriptors / Indicators:  Discomfort;Grimacing;Constant Pain Intervention(s): Limited activity within patient's tolerance;Monitored during session;Repositioned;Premedicated before session     Hand Dominance Right   Extremity/Trunk Assessment Upper Extremity Assessment Upper Extremity Assessment: Generalized weakness   Lower Extremity Assessment Lower Extremity Assessment: Generalized weakness   Cervical / Trunk Assessment Cervical / Trunk Assessment: Other exceptions Cervical / Trunk Exceptions: Increased body habitus   Communication Communication Communication: No difficulties   Cognition Arousal/Alertness: Awake/alert Behavior During Therapy: WFL for tasks assessed/performed Overall Cognitive Status: Within Functional Limits for tasks assessed                                     General Comments  wife present throughout session    Exercises     Shoulder Instructions      Home Living Family/patient expects to be discharged to:: Private residence Living Arrangements: Spouse/significant other Available Help at Discharge: Family;Available 24 hours/day Type of Home: House Home Access: Stairs to enter [employer] of Steps: 1 Entrance Stairs-Rails: None Home Layout: One level     Bathroom Shower/Tub: Teacher, early years/pre: Standard Bathroom Accessibility: Yes   Home Equipment: Environmental consultant - 2 wheels;Cane - single point;Bedside commode;Shower seat   Additional Comments: on 3L of O2 at all times at home      Prior Functioning/Environment Level of Independence: Needs assistance  Gait / Transfers Assistance Needed: uses RW for mobility ADL's / Homemaking Assistance Needed: assist from wife for LB ADL and wife does IADL            OT Problem List: Decreased strength;Decreased range of motion;Decreased activity tolerance;Impaired balance (sitting and/or standing);Decreased knowledge of use of DME or AE;Decreased knowledge of precautions;Pain;Obesity       OT Treatment/Interventions: Self-care/ADL training;Therapeutic exercise;Energy conservation;DME and/or AE instruction;Therapeutic activities;Patient/family education    OT Goals(Current goals can be found in the care plan section) Acute Rehab OT Goals Patient Stated Goal: return home OT Goal Formulation: With patient/family Time For Goal Achievement: 01/17/19 Potential to Achieve Goals: Good ADL Goals Pt Will Perform Grooming: with supervision;standing Pt Will Perform Lower Body Dressing: with set-up;with adaptive equipment;sit to/from stand Pt Will Transfer to Toilet: with modified independence;ambulating Pt Will Perform Toileting - Clothing Manipulation and hygiene: with modified independence;sit to/from stand;with adaptive equipment  OT Frequency: Min 2X/week   Barriers to D/C:            Co-evaluation              AM-PAC OT "6 Clicks" Daily Activity     Outcome Measure Help from another person eating meals?: None Help from another person taking care of personal grooming?: A Little Help from another person toileting, which includes using toliet, bedpan, or urinal?: A Little Help from another person bathing (including washing, rinsing, drying)?: A Little Help from another person to put on and taking off regular upper body clothing?: A Little Help from another person to put on and taking off regular lower body clothing?: A Little 6 Click Score: 19  End of Session Equipment Utilized During Treatment: Oxygen(3L) Nurse Communication: Mobility status;Precautions(check back on Pt in 1 hour)  Activity Tolerance: Patient tolerated treatment well Patient left: in chair;with call bell/phone within reach;with family/visitor present;with SCD's reapplied  OT Visit Diagnosis: Unsteadiness on feet (R26.81);Other abnormalities of gait and mobility (R26.89);Muscle weakness (generalized) (M62.81);Pain Pain - Right/Left: Right Pain - part of body: Hip                Time:  1559-1630 OT Time Calculation (min): 31 min Charges:  OT General Charges $OT Visit: 1 Visit OT Evaluation $OT Eval Moderate Complexity: 1 Mod OT Treatments $Self Care/Home Management : 8-22 mins  Hulda Humphrey OTR/L Acute Rehabilitation Services Pager: [phone] Office: [phone]  Merri Ray Rosalene Wardrop 01/03/2019, 4:45 PM

## 2019-01-03 NOTE — Consult Note (Addendum)
Advanced Heart Failure Team Consult Note   Primary Physician: [facility] PCP-Cardiologist: Dr [name]  Reason for Consultation:Heart Failure   HPI:    Nicholas Caldwell is seen today for evaluation of heart failiure at the request of Dr Florene Glen.   Nicholas Caldwell is a 73 y.o. male PMH of chronic systolic CHF/nonischemic cardiomyopathy Echo 12/13/2016 LVEF 40-45%, COPD on home oxygen, PAF and history of non compliance.   Admitted 1/19 - 11/26/2018 with BLE edema and wheezing. Treated for COPD exacerbation and diuresed with IV lasix. Cr 1.06 on day of discharge He was last seen in the HF  clinic back in July 2019. He had 3 other appointments that he later cancelled or no showed.   Readmitted 11/30/18 acute respiratory failure requiring intubation.  Hospital course was complicated by AKI/COPD/A/C systolic heart failure. Diuresed with IV lasix and underwent RHC/LHC. He was discharged on 12/12/2018. Discharge weight was 270 pounds.   On 2/27 he presented to [facility] with increased shortness of breath and 10-15 pound weight gain. Says he was taking all medications. CXR showed bilateral edema and small bilateral pleural effusions.  Pertinent admission labs included BNP 1273, creatinine 1.2, K 4,9, troponin 0.03, and hgb 9. He has been diuresing with IV lasix.  Brisk diuresis noted but weight has gone up? Remains SOB with exertion.    RHC/LHC 12/06/18 Nonobstructive CAD RA mean 10 RV 56/9 PA 52/16, mean 30 PCWP mean 10 LV 95/10 AO 94/48 Oxygen saturations: PA 65% AO 96% Cardiac Output (Fick) 9.2  Cardiac Index (Fick) 3.85 PVR 2.2 WU PVR  Echo 12/02/18  EF 40-45%, RV normal, R/L atrium dilated  Review of Systems: [y] = yes, _0  = no   . General: Weight gain [Y ]; Weight loss _1 ; Anorexia _2 ; Fatigue [Y ]; Fever _3 ; Chills _4 ; Weakness [ Y]  . Cardiac: Chest pain/pressure _5 ; Resting SOB _6 ; Exertional SOB [ Y]; Orthopnea [Y ]; Pedal Edema _7 ; Palpitations _8 ; Syncope _9 ;  Presyncope _10 ; Paroxysmal nocturnal dyspnea_11   . Pulmonary: Cough [Y ]; Wheezing[Y ]; Hemoptysis_12 ; Sputum _13 ; Snoring _14   . GI: Vomiting_15 ; Dysphagia_16 ; Melena_17 ; Hematochezia _18 ; Heartburn_19 ; Abdominal pain _20 ; Constipation _21 ; Diarrhea _22 ; BRBPR _23   . GU: Hematuria_24 ; Dysuria _25 ; Nocturia_26   . Vascular: Pain in legs with walking _27 ; Pain in feet with lying flat _28 ; Non-healing sores _29 ; Stroke _30 ; TIA _31 ; Slurred speech _32 ;  . Neuro: Headaches_33 ; Vertigo_34 ; Seizures_35 ; Paresthesias_36 ;Blurred vision _37 ; Diplopia _38 ; Vision changes _39   . Ortho/Skin: Arthritis _40 ; Joint pain [Y ]; Muscle pain _41 ; Joint swelling _42 ; Back Pain [Y ]; Rash _43   . Psych: Depression_44 ; Anxiety_45   . Heme: Bleeding problems _46 ; Clotting disorders _47 ; Anemia _48   . Endocrine: Diabetes _49 ; Thyroid dysfunction_50   Home Medications Prior to Admission medications   Medication Sig Start Date End Date Taking? Authorizing Provider  bisoprolol (ZEBETA) 5 MG tablet Take 0.5 tablets (2.5 mg total) by mouth daily for 30 days. 12/30/18 01/29/19 Yes TillerySatira Mccallum, PA-C  budesonide-formoterol [facility]) 160-4.5 MCG/ACT inhaler Inhale 2 puffs into the lungs 2 (two) times daily.   Yes [provider]  diazepam (VALIUM) 10 MG tablet Take 0.5 tablets (5 mg total) by mouth daily as needed  for anxiety or sleep. Patient taking differently: Take 10 mg by mouth daily as needed for anxiety or sleep.  12/19/16  Yes Theodis Blaze, MD  ferrous sulfate 325 (65 FE) MG tablet Take 325 mg by mouth 3 (three) times daily as needed (for supplementation).   Yes [provider]  fluticasone (FLONASE) 50 MCG/ACT nasal spray Place 2 sprays into both nostrils daily as needed for allergies.    Yes [provider]  folic acid (FOLVITE) 1 MG tablet Take 1 mg by mouth daily.   Yes [provider]  gabapentin (NEURONTIN) 600 MG tablet Take 1 tablet (600 mg total) by mouth 2 (two)  times daily. 12/19/16  Yes Theodis Blaze, MD  glucose 4 GM chewable tablet Chew 4 tablets by mouth See admin instructions. Chew 4 tablets by mouth as needed for low blood sugar - repeat every 15 minutes if blood sugar less than 70   Yes [provider]  guaifenesin (HUMIBID E) 400 MG TABS tablet Take 1 tablet (400 mg total) by mouth every 6 (six) hours as needed. Patient taking differently: Take 400 mg by mouth every 6 (six) hours as needed (cough).  08/02/18  Yes Regalado, Belkys A, MD  hydrocortisone (ANUSOL-HC) 2.5 % rectal cream Place 1 application rectally 2 (two) times daily as needed for hemorrhoids or itching.    Yes [provider]  hydrocortisone 1 % lotion Apply 1 application topically See admin instructions. Apply small amount to back twice daily for itchy rash   Yes [provider]  insulin aspart protamine - aspart (NOVOLOG MIX 70/30 FLEXPEN) (70-30) 100 UNIT/ML FlexPen Inject 0.05 mLs (5 Units total) into the skin 2 (two) times daily. Patient taking differently: Inject 30 Units into the skin 2 (two) times daily.  08/02/18  Yes Regalado, Belkys A, MD  ipratropium-albuterol (DUONEB) 0.5-2.5 (3) MG/3ML SOLN Take 3 mLs by nebulization 2 (two) times daily.   Yes [provider]  isosorbide-hydrALAZINE (BIDIL) 20-37.5 MG tablet Take 1 tablet by mouth 3 (three) times daily. 12/30/18  Yes Shirley Friar, PA-C  latanoprost (XALATAN) 0.005 % ophthalmic solution Place 1 drop into both eyes at bedtime.   Yes [provider]  Magnesium Oxide 420 (252 Mg) MG TABS Take 420 mg by mouth 2 (two) times daily.   Yes [provider]  metFORMIN (GLUCOPHAGE-XR) 500 MG 24 hr tablet Take 1,000 mg by mouth 2 (two) times daily with a meal.   Yes [provider]  oxyCODONE-acetaminophen (PERCOCET) 10-325 MG tablet Take 1 tablet by mouth every 6 (six) hours as needed for pain.   Yes [provider]  OXYGEN Inhale 3 L into the lungs  continuous.    Yes [provider]  potassium chloride SA (K-DUR,KLOR-CON) 20 MEQ tablet Take 2 tablets (40 mEq total) by mouth every morning AND 1 tablet (20 mEq total) at bedtime. Patient taking differently: Take 2 tablets (40 mEq total) by mouth every morning  AND 1 tablet (20 mEq total) at bedtime. 11/26/18  Yes Amin, Jeanella Flattery, MD  rivaroxaban (XARELTO) 20 MG TABS tablet Take 1 tablet (20 mg total) by mouth daily with supper. Patient taking differently: Take 20 mg by mouth daily at 6 PM.  01/20/17  Yes Jettie Booze, MD  rosuvastatin (CRESTOR) 20 MG tablet Take 1 tablet (20 mg total) by mouth daily. 05/07/18  Yes Matilde Haymaker, MD  torsemide (DEMADEX) 20 MG tablet Take 4 tablets (80 mg total) by mouth 2 (  two) times daily for 30 days. 11/26/18 12/30/18 Yes Damita Lack, MD    Past Medical History: Past Medical History:  Diagnosis Date  . Atrial flutter ([place])    a. recurrent AFlutter with RVR;  b. Amiodarone Rx started 4/16  . CAD (coronary artery disease)    a. LHC 1/16:  mLAD diffuse disease, pLCx mild disease, dLCx with disease but too small for PCI, RCA ok, EF 25-30%  . Chronic pain   . Chronic systolic CHF (congestive heart failure) ([place])   . COPD (chronic obstructive pulmonary disease) ([place])   . Diabetes mellitus without complication ([place])   . Hypercholesteremia   . Hypertension   . NICM (nonischemic cardiomyopathy) ([place])    a.dx 2016. b. 2D echo 06/2016 - Last echo 07/01/16: mod dilated LV, mod LVH, EF 25-30%, mild-mod MR, sev LAE, mild-mod reduced RV systolic function, mild-mod TR, PASP 70mHG.  .Marland KitchenPAF (paroxysmal atrial fibrillation) ([facility])    On amio - ot a candidate for flecainide due to cardiomyopathy, not a candidate for Tikosyn due to prolonged QT, and felt to be a poor candidate for ablation given left atrial size.  . [place]monary [place]   . Tobacco abuse     Past Surgical History: Past Surgical History:  Procedure Laterality Date  .  CARDIOVERSION N/A 07/30/2018   Procedure: CARDIOVERSION;  Surgeon: MLarey Dresser MD;  [facility];  Service: Cardiovascular;  Laterality: N/A;  . LEFT AND RIGHT HEART CATHETERIZATION WITH CORONARY ANGIOGRAM N/A 11/06/2014   Procedure: LEFT AND RIGHT HEART CATHETERIZATION WITH CORONARY ANGIOGRAM;  Surgeon: JJettie Booze MD;  [facility];  Service: Cardiovascular;  Laterality: N/A;  . RIGHT/LEFT HEART CATH AND CORONARY ANGIOGRAPHY N/A 12/06/2018   Procedure: RIGHT/LEFT HEART CATH AND CORONARY ANGIOGRAPHY;  Surgeon: MLarey Dresser MD;  [facility];  Service: Cardiovascular;  Laterality: N/A;  . SPLENECTOMY      Family History: Family History  Problem Relation Age of Onset  . Heart disease Mother   . Hypertension Mother   . Heart failure Mother   . Heart disease Father   . Hypertension Sister   . Heart attack Neg Hx   . Stroke Neg Hx     Social History: Social History   Socioeconomic History  . Marital status: Married    Spouse name: Not on file  . Number of children: Not on file  . Years of education: Not on file  . Highest education level: Not on file  Occupational History  . Not on file  Social Needs  . Financial resource strain: Not on file  . Food insecurity:    Worry: Not on file    Inability: Not on file  . Transportation needs:    Medical: Not on file    Non-medical: Not on file  Tobacco Use  . Smoking status: Former Smoker    Packs/day: 1.00    Years: 34.00    Pack years: 34.00    Types: Cigarettes    Last attempt to quit: 11/30/2018    Years since quitting: 0.0  . Smokeless tobacco: Never Used  Substance and Sexual Activity  . Alcohol use: No    Alcohol/week: 0.0 standard drinks  . Drug use: No  . Sexual activity: Not on file  Lifestyle  . Physical activity:    Days per week: Not on file    Minutes per session: Not on file  . Stress: Not on file  Relationships  .  Social connections:    Talks on phone: Not  on file    Gets together: Not on file    Attends religious service: Not on file    Active member of club or organization: Not on file    Attends meetings of clubs or organizations: Not on file    Relationship status: Not on file  Other Topics Concern  . Not on file  Social History Narrative  . Not on file    Allergies:  No Known Allergies  Objective:    Vital Signs:   Temp:  [97.3 F (36.3 C)-98.6 F (37 C)] 97.3 F (36.3 C) (03/02 1100) Pulse Rate:  [82-97] 87 (03/02 1100) Resp:  [15-32] 20 (03/02 1100) BP: (95-123)/(50-63) 111/61 (03/02 1100) SpO2:  [85 %-97 %] 94 % (03/02 1100) FiO2 (%):  [32 %] 32 % (03/02 0753) Weight:  [132.8 kg] 132.8 kg (03/02 0300) Last BM Date: 12/30/18  Weight change: Filed Weights   01/01/19 0328 01/02/19 0300 01/03/19 0300  Weight: 127.6 kg 132.5 kg 132.8 kg    Intake/Output:   Intake/Output Summary (Last 24 hours) at 01/03/2019 1158 Last data filed at 01/03/2019 0926 Gross per 24 hour  Intake 1920 ml  Output 5250 ml  Net -3330 ml      Physical Exam    General: Sitting in the chair.  No resp difficulty HEENT: normal Neck: supple. JVP elevated. . Carotids 2+ bilat; no bruits. No lymphadenopathy or thyromegaly appreciated. Cor: PMI nondisplaced. Irregular rate & rhythm. No rubs, gallops or murmurs. Lungs: EW throughout. On 2l  I[place] oxygen  Abdomen: soft, nontender, distended. No hepatosplenomegaly. No bruits or masses. Good bowel sounds. Extremities: no cyanosis, clubbing, rash, edema Neuro: alert & orientedx3, cranial nerves grossly intact. moves all 4 extremities w/o difficulty. Affect pleasant   Telemetry   SR PACs 85 bp,   EKG   SR PAC 85 bpm    Labs   Basic Metabolic Panel: Recent Labs  Lab 12/30/18 1621 12/30/18 1639 12/30/18 1742 12/31/18 0245 01/01/19 0246 01/02/19 0239 01/03/19 0301  NA 139 137  --  139 140 143 139  K 4.9 4.7  --  4.5 3.7 3.3* 3.1*  CL 96*  --   --  96* 95* 91* 88*  CO2 32  --   --   36* 34* 39* 41*  GLUCOSE 147*  --   --  68* 119* 139* 172*  BUN 25*  --   --  24* 25* 23 22  CREATININE 1.20  --   --  1.16 1.19 1.23 1.35*  CALCIUM 8.0*  --   --  8.1* 8.0* 8.2* 8.2*  MG  --   --  2.1  --  2.3 2.5* 2.4    Liver Function Tests: Recent Labs  Lab 12/30/18 1621 01/01/19 0246 01/02/19 0953 01/03/19 0301  AST _0 ALT _1 ALKPHOS 97 113 110 103  BILITOT 0.3 0.4 0.2* 0.4  PROT 6.6 6.4* 6.5 6.2*  ALBUMIN 3.1* 3.0* 2.9* 2.9*   Recent Labs  Lab 01/02/19 0953  LIPASE 26   No results for input(s): AMMONIA in the last 168 hours.  CBC: Recent Labs  Lab 12/30/18 1621 12/30/18 1639 12/31/18 0245 01/01/19 0246 01/02/19 0953 01/03/19 0301  WBC 7.9  --  7.6 7.8 9.8 9.6  NEUTROABS 6.2  --   --   --   --   --   HGB 9.0* 10.2* 8.7* 8.3* 8.3* 8.3*  HCT 32.3* 30.0* 31.0* 29.6* 28.7* 28.6*  MCV 92.6  --  91.2 90.0 89.7 88.3  PLT 481*  --  448* 459* 438* 430*    Cardiac Enzymes: No results for input(s): CKTOTAL, CKMB, CKMBINDEX, TROPONINI in the last 168 hours.  BNP: BNP (last 3 results) Recent Labs    11/21/18 2238 12/01/18 0015 12/30/18 1621  BNP 1,739.7* 1,666.7* 1,273.0*    ProBNP (last 3 results) No results for input(s): PROBNP in the last 8760 hours.   CBG: Recent Labs  Lab 01/02/19 1113 01/02/19 1610 01/02/19 2203 01/03/19 0641 01/03/19 1059  GLUCAP 137* 126* 157* 128* 181*    Coagulation Studies: No results for input(s): LABPROT, INR in the last 72 hours.   Imaging   Ct Abdomen Pelvis W Contrast  Result Date: 01/03/2019 CLINICAL DATA:  Acute abdominal pain. EXAM: CT ABDOMEN AND PELVIS WITH CONTRAST TECHNIQUE: Multidetector CT imaging of the abdomen and pelvis was performed using the standard protocol following bolus administration of intravenous contrast. CONTRAST:  125m OMNIPAQUE IOHEXOL 300 MG/ML  SOLN COMPARISON:  Renal ultrasound 12/01/2018, no prior abdominal CT. FINDINGS: Lower chest: Small to moderate right and  small left pleural effusion. Associated compressive atelectasis in the lower lobes. Cardiomegaly with small pericardial effusion. Hepatobiliary: No focal hepatic abnormality. Gallbladder physiologically distended, no calcified stone. No biliary dilatation. Pancreas: No ductal dilatation or inflammation. Spleen: Post splenectomy with multifocal splenic deposits in the left upper quadrant. Soft tissue deposits in the lesser sac may be splenosis or lymph nodes. Adrenals/Urinary Tract: Possible 2 cm left adrenal nodule versus adjacent splenic deposit. The right adrenal gland appears normal. No hydronephrosis. No visualized nephrolithiasis. Absent renal excretion on delayed phase imaging. Urinary bladder is physiologically distended, no bladder wall thickening. Stomach/Bowel: Stomach is nondistended. Normal positioning of the duodenal jejunal junction. No small bowel dilatation, obstruction or inflammation. Mild fecalization of mid small bowel contents. Administered enteric contrast reaches the distal small bowel. Normal appendix. Moderate volume of stool throughout the colon. There is significant sigmoid colonic tortuosity. Vascular/Lymphatic: Aorto bi-iliac atherosclerosis. No aneurysm. Prominent and mildly enlarged retroperitoneal and upper abdominal lymph nodes, anterior caval node measures 14 mm short axis at the level of the gallbladder. Multiple additional prominent retroperitoneal nodes. Difficult to differentiate lymph nodes from splenosis related to splenectomy. There are small pericardial nodes. Reproductive: Streak artifact from left hip arthroplasty partially obscures the prostate gland. Other: Retroperitoneal stranding greatest in the lower abdomen/pelvis. No frank ascites. No free air. Flank edema, left greater than right. Musculoskeletal: Degenerative change in the spine. Degenerative change in the right hip with avascular necrosis, no subchondral collapse. Left hip arthroplasty. IMPRESSION: 1. No acute  findings or explanation for abdominal pain. 2. Multiple prominent and enlarged lymph nodes in the retroperitoneum and upper abdomen, likely reactive but nonspecific. Additionally patient is post splenectomy with multifocal splenosis and splenic deposits in the upper abdomen, which complicates lymph node evaluation. There are no prior exams available for comparison to evaluate for stability. Recommend clinical and laboratory evaluation for lymphoproliferative disorder. The absence of prior imaging, recommend CT follow-up in 3 months. 3. Absent renal excretion on delayed phase imaging consistent with renal dysfunction. 4. Small to moderate right and small left pleural effusions. Cardiomegaly with small pericardial effusion. 5.  Aortic Atherosclerosis (ICD10-I70.0). Electronically Signed   By: MKeith RakeM.D.   On: 01/03/2019 00:35      Medications:     Current Medications: . dicyclomine  20 mg Oral TID AC & HS  . famotidine  20 mg Oral BID  . folic acid  1 mg Oral Daily  . furosemide  80 mg Intravenous BID  . gabapentin  600 mg Oral BID  . insulin aspart  0-5 Units Subcutaneous QHS  . insulin aspart  0-9 Units Subcutaneous TID WC  . insulin aspart protamine- aspart  10 Units Subcutaneous BID WC  . ipratropium-albuterol  3 mL Nebulization TID  . isosorbide-hydrALAZINE  1 tablet Oral TID  . latanoprost  1 drop Both Eyes QHS  . magnesium oxide  400 mg Oral BID  . mouth rinse  15 mL Mouth Rinse BID  . mometasone-formoterol  2 puff Inhalation BID  . polyethylene glycol  17 g Oral Daily  . potassium chloride  20 mEq Oral BID  . potassium chloride  40 mEq Oral Q4H  . rivaroxaban  20 mg Oral q1800  . rosuvastatin  20 mg Oral q1800     Infusions:     Patient Profile  Nicholas Caldwell is a 73 y.o. male PMH of chronic systolic CHF/nonischemic cardiomyopathy Echo 12/13/2016 LVEF 40-45%, COPD on home oxygen, PAF and history of non compliance.   Admitted with A/C respiratory failure and A/C  systolic heart failure.   Assessment/Plan   1. A/C Respiratory Failure On chronic 3 liters oxygen  CXR on admit with bilateral small effusions and atelectasis. Todays CXR has worsening atelectasis/infiltrates in the LLL.  Continue antibiotics, nebs, per primary team. Steroids per primary   Sed Rate 49 . Procalcitonin 0.1.   2. A/C Systolic Heart Failure  ECHO Ef 40-45%  Volume status elevated. Increase lasix to 80 mg tid.  - I am not sure what triggered his volume overload.  - Add diamox 250 mg twice a day x 3  Follow renal function closely.   3. PAF EKG now---> NSR with PACs  On Xarelto 20 mg daily.  Not tikosyn or amiodarone due to Qt prolongation.   4. Anemia  -H/O GI bleed and received PRBCs 2/8 -Hgb 8.3 -No s/s bleeding.  Continue ferrous sulfate.  - Check CBC   5. Alkalosis: Suspect compensation for respiratory acidosis.   Add diamox 250 mg twice a day x 3 days.  When he is discharged he will need HF Paramedicine. I will refer him today.   Length of Stay: 4  [name], NP  01/03/2019, 11:58 AM  Advanced Heart Failure Team Pager [phone] (M-F; 7a - 4p)  Please contact [facility] for night-coverage after hours (4p -7a ) and weekends on [email or web address]  Patient seen with NP, agree with the above note.   Patient has gained significant weight and presents with decompensated CHF despite reported compliance with home torsemide regimen.  He is wheezing but afebrile with normal WBCs.  He is in NSR.   On exam, JVP 14-16 cm.  Trace ankle edema.  Moderate abdominal distention.  Diffuse wheezing bilaterally.   1. Acute/chronic systolic CHF: Nonischemic cardiomyopathy based on cath in 2/20.  Echo in 1/20 with EF 40-45%.  He is volume overloaded on exam with weight gain.  Severe COPD on home oxygen is a confounder.  - Lasix 80 mg IV every 8 hrs + acetazolamide 250 mg bid x 3 days with elevated HCO3.  Replace K.  - Continue Bidil 1 tab tid.  - Add spironolactone 12.5 mg daily.    - Hold bisoprolol with volume overload and wheezing.  - With abdominal distention, will get limited US to look for ascites.  2. Atrial fibrillation: Paroxysmal.  He  is in NSR today.  - Has not been felt to be a candidate for Tikosyn and amiodarone due to QT prolongation and severe lung disease.  3. COPD: Wears home oxygen at baseline. Suspect co-existing COPD exacerbation with diffuse wheezing. CXR with left base infiltrate but PCT not elevated, no WBC elevation, no fever.  CT chest with bibasilar atelectasis (no definite PNA), mild pulmonary edema, and underlying COPD.  - Continue Duonebs scheduled, may need to consider addition of steroids.  - Suspect not PNA, would consider, however, treatment with abx (?doxycycline) for COPD exacerbation.  4. Anemia: H/o GI bleeding but no overt bleeding currently.  Follow CBC.   Loralie Champagne 01/03/2019 2:17 PM

## 2019-01-03 NOTE — Progress Notes (Signed)
PROGRESS NOTE    Nicholas Caldwell  OMV:[id number] DOB: 1946/09/25 DOA: 12/30/2018 PCP: [facility]   Brief Narrative:  [id number] w/ a hx of combined systolic and diastolic heart failure (66-29% by TTE 2020),paroxysmal A. fib, pulmonary hypertension, nonischemic cardiomyopathy, COPD, and chronic hypoxic respiratory failure on 3 L of oxygen who presented w/ worsening shortness of breath over a couple of days w/ a 10-15 pound weight gain over 2-3 weeks.   Assessment & Plan:   Active Problems:   Essential hypertension   Type 2 diabetes mellitus with neuropathy   Dyspnea   PAF (paroxysmal atrial fibrillation) (HCC)   Nonischemic cardiomyopathy (HCC)   CAD - Non-obstructive by LHC1/16   Acute on chronic combined systolic and diastolic CHF (congestive heart failure) (HCC)   Heart failure (HCC)   Acute on chronic hypercarbic/hypoxemic respiratory failure Chronically on 3 L oxygen Diuresing as noted below CXR with increasing atelectasis vs infiltrate in L base with associated effusion Will plan to follow up CT scan  At this point in time, lower suspicion for infection.  Normal WBC count and low procalcitonin.  Afebrile.  Continue to monitor off abx. Schedule nebs for COPD below as some wheezing on exam today  Acute on chronic combined systolic and diastolic heart failure exacerbation Dry weight he thinks is around 268 lbs when he feels good (~122 kg).  Weight at discharge during last hospitalization was 122.8 kg Suspected cause is dietary noncompliance, he reported compliance with meds On torsemide 80 BID at home Will decrease lasix to BID with developing alkalosis, though think he still needs diuresis What appears to be contraction alkalosis developing Negative 1.7 L yesterday, -12 L over hospitalization Weight increasing (not sure this is accurate given I/O) Will consult cardiology for assistance with diuresiss  Pulmonary HTN: suspected mixed pulmonary venous HTN and PAH from  COPD due to COPD on cath from 2/3.  Needs outpatient sleep study.    Metabolic Alkalosis: VBG with elevated pCO2, suspect this is 2/2 chronic respiratory acidosis with contribution from contraction alkalosis.  Paroxysmal A. Fib continue Xarelto - previously failed amiodarone due to markedly prolonged QT - not tikosyn candidate for same reason  COPD continue nebulizer treatment, Symbicort Schedule nebs [name] outpatient sleep study  Chronic pain, anxiety Continue with oxycodone, and Valium as per home doses   Obesity - Body mass index is 38.42 kg/m.   DM Follow CBG and SSI Continue 70/30 10 units BID (caution if pt NPO at any time)  Hypokalemia: replace and follow  Abdominal Discomfort: c/o pain that he describes as like ulcer.  He's focused on pepcid as the only medication that helps in this situation.  Continue bentyl.   Bowel regimen CT scan from yesterday without acute findings, but notable for lymphadenopathy  Lymphadenopathy: abdominal CT with enlarged LN in retroperitoneum and upper abdomen.  Possibly reactive?  Will follow up ESR, CRP, and LDH.  Follow CT chest as noted above.  Will need repeat CT scan in 3 months and outpatient follow up with PCP and possibly oncology.  Elevated Creatinine: 1.35 in setting of diuresis and contrast.  Continue to monitor  DVT prophylaxis: xarelto Code Status: full  Family Communication: none at bedside Disposition Plan: pending improvement in respiratory symptoms   Consultants:   none  Procedures:   none  Antimicrobials:  Anti-infectives (From admission, onward)   Start     Dose/Rate Route Frequency Ordered Stop   01/02/19 2200  ceFEPIme (MAXIPIME) 1 g in sodium chloride 0.9 %  100 mL IVPB  Status:  Discontinued     1 g 200 mL/hr over 30 Minutes Intravenous Every 8 hours 01/02/19 1842 01/02/19 1842      Subjective: Still feels poorly Ulcer pain And sob Not feeling too much better. Asking for more  pepcid  Objective: Vitals:   01/03/19 0745 01/03/19 0753 01/03/19 0839 01/03/19 1100  BP:   (!) 123/54 111/61  Pulse: 90  94 87  Resp: (!) _0 Temp:   98.4 F (36.9 C) (!) 97.3 F (36.3 C)  TempSrc:   Oral Axillary  SpO2: 94% 94% (!) 85% 94%  Weight:      Height:        Intake/Output Summary (Last 24 hours) at 01/03/2019 1127 Last data filed at 01/03/2019 0926 Gross per 24 hour  Intake 1920 ml  Output 5250 ml  Net -3330 ml   Filed Weights   01/01/19 0328 01/02/19 0300 01/03/19 0300  Weight: 127.6 kg 132.5 kg 132.8 kg    Examination:  General: No acute distress. Cardiovascular: Heart sounds show a regular rate, and rhythm. JVD difficult to appreciate with body habitus. Lungs: Coarse breath sounds  Abdomen: Soft, nontender, protuberant Neurological: Alert and oriented 3. Moves all extremities 4. Cranial nerves II through XII grossly intact. Skin: Warm and dry. No rashes or lesions. Extremities: No LEE, but trace to 1+ dependent sacral edema Psychiatric: Mood and affect are normal. Insight and judgment are appropriate.  Data Reviewed: I have personally reviewed following labs and imaging studies  CBC: Recent Labs  Lab 12/30/18 1621 12/30/18 1639 12/31/18 0245 01/01/19 0246 01/02/19 0953 01/03/19 0301  WBC 7.9  --  7.6 7.8 9.8 9.6  NEUTROABS 6.2  --   --   --   --   --   HGB 9.0* 10.2* 8.7* 8.3* 8.3* 8.3*  HCT 32.3* 30.0* 31.0* 29.6* 28.7* 28.6*  MCV 92.6  --  91.2 90.0 89.7 88.3  PLT 481*  --  448* 459* 438* 758*   Basic Metabolic Panel: Recent Labs  Lab 12/30/18 1621 12/30/18 1639 12/30/18 1742 12/31/18 0245 01/01/19 0246 01/02/19 0239 01/03/19 0301  NA 139 137  --  139 140 143 139  K 4.9 4.7  --  4.5 3.7 3.3* 3.1*  CL 96*  --   --  96* 95* 91* 88*  CO2 32  --   --  36* 34* 39* 41*  GLUCOSE 147*  --   --  68* 119* 139* 172*  BUN 25*  --   --  24* 25* 23 22  CREATININE 1.20  --   --  1.16 1.19 1.23 1.35*  CALCIUM 8.0*  --   --  8.1* 8.0*  8.2* 8.2*  MG  --   --  2.1  --  2.3 2.5* 2.4   GFR: Estimated Creatinine Clearance: 71.8 mL/min (A) (by C-G formula based on SCr of 1.35 mg/dL (H)). Liver Function Tests: Recent Labs  Lab 12/30/18 1621 01/01/19 0246 01/02/19 0953 01/03/19 0301  AST _1 ALT _2 ALKPHOS 97 113 110 103  BILITOT 0.3 0.4 0.2* 0.4  PROT 6.6 6.4* 6.5 6.2*  ALBUMIN 3.1* 3.0* 2.9* 2.9*   Recent Labs  Lab 01/02/19 0953  LIPASE 26   No results for input(s): AMMONIA in the last 168 hours. Coagulation Profile: Recent Labs  Lab 12/30/18 1621  INR 1.6*   Cardiac Enzymes: No results for input(s): CKTOTAL, CKMB, CKMBINDEX,  TROPONINI in the last 168 hours. BNP (last 3 results) No results for input(s): PROBNP in the last 8760 hours. HbA1C: No results for input(s): HGBA1C in the last 72 hours. CBG: Recent Labs  Lab 01/02/19 1113 01/02/19 1610 01/02/19 2203 01/03/19 0641 01/03/19 1059  GLUCAP 137* 126* 157* 128* 181*   Lipid Profile: No results for input(s): CHOL, HDL, LDLCALC, TRIG, CHOLHDL, LDLDIRECT in the last 72 hours. Thyroid Function Tests: No results for input(s): TSH, T4TOTAL, FREET4, T3FREE, THYROIDAB in the last 72 hours. Anemia Panel: No results for input(s): VITAMINB12, FOLATE, FERRITIN, TIBC, IRON, RETICCTPCT in the last 72 hours. Sepsis Labs: Recent Labs  Lab 01/02/19 1907 01/03/19 0301  PROCALCITON <0.10 0.10    Recent Results (from the past 240 hour(s))  MRSA PCR Screening     Status: None   Collection Time: 12/30/18  6:53 PM  Result Value Ref Range Status   MRSA by PCR NEGATIVE NEGATIVE Final    Comment:        The GeneXpert MRSA Assay (FDA approved for NASAL specimens only), is one component of a comprehensive MRSA colonization surveillance program. It is not intended to diagnose MRSA infection nor to guide or monitor treatment for MRSA infections. Performed at [facility], [place] [address] [place], [place] [address]           Radiology Studies: Dg Chest 2 View  Result Date: 01/02/2019 CLINICAL DATA:  Hypoxia EXAM: CHEST - 2 VIEW COMPARISON:  12/30/2018 FINDINGS: Cardiac shadow is enlarged but stable. Increased vascular congestion is again identified. Increasing left basilar density consistent with evolving infiltrate/atelectasis is noted. Left effusion is noted as well. Small right-sided pleural effusion is noted. IMPRESSION: Increasing atelectasis/infiltrate in the left base with associated effusion. Stable vascular congestion is noted. Electronically Signed   By: Inez Catalina M.D.   On: 01/02/2019 12:16   Ct Abdomen Pelvis W Contrast  Result Date: 01/03/2019 CLINICAL DATA:  Acute abdominal pain. EXAM: CT ABDOMEN AND PELVIS WITH CONTRAST TECHNIQUE: Multidetector CT imaging of the abdomen and pelvis was performed using the standard protocol following bolus administration of intravenous contrast. CONTRAST:  160m OMNIPAQUE IOHEXOL 300 MG/ML  SOLN COMPARISON:  Renal ultrasound 12/01/2018, no prior abdominal CT. FINDINGS: Lower chest: Small to moderate right and small left pleural effusion. Associated compressive atelectasis in the lower lobes. Cardiomegaly with small pericardial effusion. Hepatobiliary: No focal hepatic abnormality. Gallbladder physiologically distended, no calcified stone. No biliary dilatation. Pancreas: No ductal dilatation or inflammation. Spleen: Post splenectomy with multifocal splenic deposits in the left upper quadrant. Soft tissue deposits in the lesser sac may be splenosis or lymph nodes. Adrenals/Urinary Tract: Possible 2 cm left adrenal nodule versus adjacent splenic deposit. The right adrenal gland appears normal. No hydronephrosis. No visualized nephrolithiasis. Absent renal excretion on delayed phase imaging. Urinary bladder is physiologically distended, no bladder wall thickening. Stomach/Bowel: Stomach is nondistended. Normal positioning of the duodenal jejunal junction. No small bowel  dilatation, obstruction or inflammation. Mild fecalization of mid small bowel contents. Administered enteric contrast reaches the distal small bowel. Normal appendix. Moderate volume of stool throughout the colon. There is significant sigmoid colonic tortuosity. Vascular/Lymphatic: Aorto bi-iliac atherosclerosis. No aneurysm. Prominent and mildly enlarged retroperitoneal and upper abdominal lymph nodes, anterior caval node measures 14 mm short axis at the level of the gallbladder. Multiple additional prominent retroperitoneal nodes. Difficult to differentiate lymph nodes from splenosis related to splenectomy. There are small pericardial nodes. Reproductive: Streak artifact from left hip arthroplasty partially obscures the prostate  gland. Other: Retroperitoneal stranding greatest in the lower abdomen/pelvis. No frank ascites. No free air. Flank edema, left greater than right. Musculoskeletal: Degenerative change in the spine. Degenerative change in the right hip with avascular necrosis, no subchondral collapse. Left hip arthroplasty. IMPRESSION: 1. No acute findings or explanation for abdominal pain. 2. Multiple prominent and enlarged lymph nodes in the retroperitoneum and upper abdomen, likely reactive but nonspecific. Additionally patient is post splenectomy with multifocal splenosis and splenic deposits in the upper abdomen, which complicates lymph node evaluation. There are no prior exams available for comparison to evaluate for stability. Recommend clinical and laboratory evaluation for lymphoproliferative disorder. The absence of prior imaging, recommend CT follow-up in 3 months. 3. Absent renal excretion on delayed phase imaging consistent with renal dysfunction. 4. Small to moderate right and small left pleural effusions. Cardiomegaly with small pericardial effusion. 5.  Aortic Atherosclerosis (ICD10-I70.0). Electronically Signed   By: Keith Rake M.D.   On: 01/03/2019 00:35        Scheduled  Meds: . dicyclomine  20 mg Oral TID AC & HS  . famotidine  20 mg Oral BID  . folic acid  1 mg Oral Daily  . furosemide  80 mg Intravenous BID  . gabapentin  600 mg Oral BID  . insulin aspart  0-5 Units Subcutaneous QHS  . insulin aspart  0-9 Units Subcutaneous TID WC  . insulin aspart protamine- aspart  10 Units Subcutaneous BID WC  . ipratropium-albuterol  3 mL Nebulization TID  . isosorbide-hydrALAZINE  1 tablet Oral TID  . latanoprost  1 drop Both Eyes QHS  . magnesium oxide  400 mg Oral BID  . mouth rinse  15 mL Mouth Rinse BID  . mometasone-formoterol  2 puff Inhalation BID  . polyethylene glycol  17 g Oral Daily  . potassium chloride  20 mEq Oral BID  . potassium chloride  40 mEq Oral Q4H  . rivaroxaban  20 mg Oral q1800  . rosuvastatin  20 mg Oral q1800   Continuous Infusions:   LOS: 4 days    Time spent: over 30 min    Fayrene Helper, MD [facility] Pager AMION  If 7PM-7AM, please contact night-coverage [email or web address] Password [email or web address] 01/03/2019, 11:27 AM

## 2019-01-03 NOTE — Progress Notes (Signed)
RT Note:  Went into patient's room to see if patient wanted to be placed on CPAP.  Called patient's name and patient kept sleeping.  Spoke with CN outside of patient room and explained that patient was sleeping and did not seem to want to be disturbed.  Told CN to notify me if patient wanted to be placed on CPAP tonight.  Will await call if patient decides to try it.

## 2019-01-03 NOTE — Care Management Important Message (Signed)
Important Message  Patient Details  Name: Nicholas Caldwell MRN: [id number] Date of Birth: Jan 24, 1946   Medicare Important Message Given:  Yes    Rosaire Cueto P Kersti Scavone 01/03/2019, 1:20 PM

## 2019-01-04 ENCOUNTER — Inpatient Hospital Stay (HOSPITAL_COMMUNITY): Payer: Medicare Other

## 2019-01-04 DIAGNOSIS — Z794 Long term (current) use of insulin: Secondary | ICD-10-CM

## 2019-01-04 DIAGNOSIS — R4182 Altered mental status, unspecified: Secondary | ICD-10-CM

## 2019-01-04 DIAGNOSIS — E1140 Type 2 diabetes mellitus with diabetic neuropathy, unspecified: Secondary | ICD-10-CM

## 2019-01-04 LAB — CBC
HCT: 28.5 % — ABNORMAL LOW (ref 39.0–52.0)
HCT: 29.9 % — ABNORMAL LOW (ref 39.0–52.0)
Hemoglobin: 8.2 g/dL — ABNORMAL LOW (ref 13.0–17.0)
Hemoglobin: 8.5 g/dL — ABNORMAL LOW (ref 13.0–17.0)
MCH: 25.6 pg — ABNORMAL LOW (ref 26.0–34.0)
MCH: 25.6 pg — ABNORMAL LOW (ref 26.0–34.0)
MCHC: 28.8 g/dL — ABNORMAL LOW (ref 30.0–36.0)
MCHC: 28.4 g/dL — ABNORMAL LOW (ref 30.0–36.0)
MCV: 89.1 fL (ref 80.0–100.0)
MCV: 90.1 fL (ref 80.0–100.0)
NRBC: 0 % (ref 0.0–0.2)
PLATELETS: 424 10*3/uL — AB (ref 150–400)
Platelets: 430 10*3/uL — ABNORMAL HIGH (ref 150–400)
RBC: 3.2 MIL/uL — ABNORMAL LOW (ref 4.22–5.81)
RBC: 3.32 MIL/uL — ABNORMAL LOW (ref 4.22–5.81)
RDW: 18.5 % — AB (ref 11.5–15.5)
RDW: 18.5 % — ABNORMAL HIGH (ref 11.5–15.5)
WBC: 10.3 10*3/uL (ref 4.0–10.5)
WBC: 9.2 10*3/uL (ref 4.0–10.5)
nRBC: 0.2 % (ref 0.0–0.2)

## 2019-01-04 LAB — HEPARIN LEVEL (UNFRACTIONATED): HEPARIN UNFRACTIONATED: 1.48 [IU]/mL — AB (ref 0.30–0.70)

## 2019-01-04 LAB — BLOOD GAS, ARTERIAL
Acid-Base Excess: 17.7 mmol/L — ABNORMAL HIGH (ref 0.0–2.0)
Bicarbonate: 44.1 mmol/L — ABNORMAL HIGH (ref 20.0–28.0)
Drawn by: 51185
O2 Content: 4 L/min
O2 Saturation: 97.4 %
Patient temperature: 98.6
pCO2 arterial: 81.5 mmHg (ref 32.0–48.0)
pH, Arterial: 7.353 (ref 7.350–7.450)
pO2, Arterial: 99.2 mmHg (ref 83.0–108.0)

## 2019-01-04 LAB — GLUCOSE, CAPILLARY
Glucose-Capillary: 154 mg/dL — ABNORMAL HIGH (ref 70–99)
Glucose-Capillary: 155 mg/dL — ABNORMAL HIGH (ref 70–99)
Glucose-Capillary: 160 mg/dL — ABNORMAL HIGH (ref 70–99)
Glucose-Capillary: 210 mg/dL — ABNORMAL HIGH (ref 70–99)
Glucose-Capillary: 211 mg/dL — ABNORMAL HIGH (ref 70–99)
Glucose-Capillary: 211 mg/dL — ABNORMAL HIGH (ref 70–99)

## 2019-01-04 LAB — POCT I-STAT 7, (LYTES, BLD GAS, ICA,H+H)
Acid-Base Excess: 18 mmol/L — ABNORMAL HIGH (ref 0.0–2.0)
Bicarbonate: 44.8 mmol/L — ABNORMAL HIGH (ref 20.0–28.0)
Calcium, Ion: 1.08 mmol/L — ABNORMAL LOW (ref 1.15–1.40)
HCT: 29 % — ABNORMAL LOW (ref 39.0–52.0)
Hemoglobin: 9.9 g/dL — ABNORMAL LOW (ref 13.0–17.0)
O2 Saturation: 100 %
Potassium: 3.4 mmol/L — ABNORMAL LOW (ref 3.5–5.1)
Sodium: 139 mmol/L (ref 135–145)
TCO2: 47 mmol/L — ABNORMAL HIGH (ref 22–32)
pCO2 arterial: 64.2 mmHg — ABNORMAL HIGH (ref 32.0–48.0)
pH, Arterial: 7.452 — ABNORMAL HIGH (ref 7.350–7.450)
pO2, Arterial: 217 mmHg — ABNORMAL HIGH (ref 83.0–108.0)

## 2019-01-04 LAB — PROCALCITONIN: Procalcitonin: 0.15 ng/mL

## 2019-01-04 LAB — MAGNESIUM: MAGNESIUM: 2.5 mg/dL — AB (ref 1.7–2.4)

## 2019-01-04 LAB — COMPREHENSIVE METABOLIC PANEL
ALT: 12 U/L (ref 0–44)
AST: 15 U/L (ref 15–41)
Albumin: 3.1 g/dL — ABNORMAL LOW (ref 3.5–5.0)
Alkaline Phosphatase: 102 U/L (ref 38–126)
Anion gap: 12 (ref 5–15)
BUN: 28 mg/dL — AB (ref 8–23)
CHLORIDE: 89 mmol/L — AB (ref 98–111)
CO2: 40 mmol/L — ABNORMAL HIGH (ref 22–32)
CREATININE: 1.51 mg/dL — AB (ref 0.61–1.24)
Calcium: 8.4 mg/dL — ABNORMAL LOW (ref 8.9–10.3)
GFR calc Af Amer: 53 mL/min — ABNORMAL LOW (ref >60)
GFR calc non Af Amer: 46 mL/min — ABNORMAL LOW (ref >60)
Glucose, Bld: 152 mg/dL — ABNORMAL HIGH (ref 70–99)
Potassium: 3.2 mmol/L — ABNORMAL LOW (ref 3.5–5.1)
Sodium: 141 mmol/L (ref 135–145)
Total Bilirubin: 0.3 mg/dL (ref 0.3–1.2)
Total Protein: 6.9 g/dL (ref 6.5–8.1)

## 2019-01-04 LAB — APTT: aPTT: 31 seconds (ref 24–36)

## 2019-01-04 MED ORDER — METHYLPREDNISOLONE SODIUM SUCC 125 MG IJ SOLR
60.0000 mg | Freq: Two times a day (BID) | INTRAMUSCULAR | Status: DC
  Administered 2019-01-04: 60 mg via INTRAVENOUS
  Filled 2019-01-04: qty 2

## 2019-01-04 MED ORDER — CHLORHEXIDINE GLUCONATE 0.12% ORAL RINSE (MEDLINE KIT)
15.0000 mL | Freq: Two times a day (BID) | OROMUCOSAL | Status: DC
  Administered 2019-01-04 – 2019-01-11 (×14): 15 mL via OROMUCOSAL

## 2019-01-04 MED ORDER — MIDAZOLAM HCL 2 MG/2ML IJ SOLN
2.0000 mg | Freq: Once | INTRAMUSCULAR | Status: AC
  Administered 2019-01-04: 2 mg via INTRAVENOUS

## 2019-01-04 MED ORDER — SPIRONOLACTONE 25 MG PO TABS
25.0000 mg | ORAL_TABLET | Freq: Every day | ORAL | Status: DC
  Administered 2019-01-04: 25 mg via ORAL
  Filled 2019-01-04: qty 1

## 2019-01-04 MED ORDER — IPRATROPIUM-ALBUTEROL 0.5-2.5 (3) MG/3ML IN SOLN
3.0000 mL | Freq: Four times a day (QID) | RESPIRATORY_TRACT | Status: DC
  Administered 2019-01-04 – 2019-01-05 (×3): 3 mL via RESPIRATORY_TRACT
  Filled 2019-01-04 (×3): qty 3

## 2019-01-04 MED ORDER — DEXMEDETOMIDINE HCL IN NACL 400 MCG/100ML IV SOLN
0.4000 ug/kg/h | INTRAVENOUS | Status: DC
  Administered 2019-01-04 – 2019-01-05 (×3): 0.6 ug/kg/h via INTRAVENOUS
  Administered 2019-01-05 (×3): 0.8 ug/kg/h via INTRAVENOUS
  Administered 2019-01-05: 0.6 ug/kg/h via INTRAVENOUS
  Administered 2019-01-05: 1 ug/kg/h via INTRAVENOUS
  Administered 2019-01-06 (×4): 1.2 ug/kg/h via INTRAVENOUS
  Administered 2019-01-06: 0.7 ug/kg/h via INTRAVENOUS
  Administered 2019-01-06: 1.202 ug/kg/h via INTRAVENOUS
  Administered 2019-01-06 – 2019-01-07 (×2): 0.6 ug/kg/h via INTRAVENOUS
  Administered 2019-01-07: 1.2 ug/kg/h via INTRAVENOUS
  Administered 2019-01-07: 0.6 ug/kg/h via INTRAVENOUS
  Administered 2019-01-07: 0.402 ug/kg/h via INTRAVENOUS
  Administered 2019-01-07: 1.2 ug/kg/h via INTRAVENOUS
  Administered 2019-01-08 (×3): 0.4 ug/kg/h via INTRAVENOUS
  Administered 2019-01-08: 0.6 ug/kg/h via INTRAVENOUS
  Administered 2019-01-09 (×2): 0.4 ug/kg/h via INTRAVENOUS
  Administered 2019-01-09: 0.402 ug/kg/h via INTRAVENOUS
  Administered 2019-01-10 (×2): 0.4 ug/kg/h via INTRAVENOUS
  Administered 2019-01-10: 0.6 ug/kg/h via INTRAVENOUS
  Administered 2019-01-11: 0.4 ug/kg/h via INTRAVENOUS
  Filled 2019-01-04 (×18): qty 100
  Filled 2019-01-04: qty 200
  Filled 2019-01-04 (×11): qty 100

## 2019-01-04 MED ORDER — DEXTROSE 50 % IV SOLN
INTRAVENOUS | Status: AC
  Filled 2019-01-04: qty 50

## 2019-01-04 MED ORDER — DEXMEDETOMIDINE HCL IN NACL 400 MCG/100ML IV SOLN: 0.0000 ug/kg/h | INTRAVENOUS | Status: DC

## 2019-01-04 MED ORDER — NALOXONE HCL 0.4 MG/ML IJ SOLN
0.4000 mg | INTRAMUSCULAR | Status: DC | PRN
  Administered 2019-01-04 (×3): 0.4 mg via INTRAVENOUS
  Filled 2019-01-04 (×3): qty 1

## 2019-01-04 MED ORDER — ORAL CARE MOUTH RINSE
15.0000 mL | OROMUCOSAL | Status: DC
  Administered 2019-01-04 – 2019-01-11 (×65): 15 mL via OROMUCOSAL

## 2019-01-04 MED ORDER — METHYLPREDNISOLONE SODIUM SUCC 125 MG IJ SOLR: 60.0000 mg | Freq: Three times a day (TID) | INTRAMUSCULAR | Status: DC

## 2019-01-04 MED ORDER — HEPARIN (PORCINE) 25000 UT/250ML-% IV SOLN
1500.0000 [IU]/h | INTRAVENOUS | Status: DC
  Administered 2019-01-04: 1500 [IU]/h via INTRAVENOUS
  Filled 2019-01-04: qty 250

## 2019-01-04 MED ORDER — POTASSIUM CHLORIDE CRYS ER 20 MEQ PO TBCR
40.0000 meq | EXTENDED_RELEASE_TABLET | Freq: Once | ORAL | Status: AC
  Administered 2019-01-04: 40 meq via ORAL
  Filled 2019-01-04: qty 2

## 2019-01-04 MED ORDER — ROCURONIUM BROMIDE 50 MG/5ML IV SOLN
50.0000 mg | Freq: Once | INTRAVENOUS | Status: AC
  Administered 2019-01-04: 50 mg via INTRAVENOUS
  Filled 2019-01-04: qty 5

## 2019-01-04 MED ORDER — MIDAZOLAM HCL 2 MG/2ML IJ SOLN
INTRAMUSCULAR | Status: AC
  Filled 2019-01-04: qty 2

## 2019-01-04 MED ORDER — NOREPINEPHRINE 4 MG/250ML-% IV SOLN
2.0000 ug/min | INTRAVENOUS | Status: DC
  Administered 2019-01-04: 2 ug/min via INTRAVENOUS
  Filled 2019-01-04: qty 250

## 2019-01-04 MED ORDER — SODIUM CHLORIDE 0.9 % IV SOLN
250.0000 mL | INTRAVENOUS | Status: DC
  Administered 2019-01-04 – 2019-01-06 (×2): 250 mL via INTRAVENOUS

## 2019-01-04 MED ORDER — FENTANYL CITRATE (PF) 100 MCG/2ML IJ SOLN
100.0000 ug | INTRAMUSCULAR | Status: DC | PRN
  Administered 2019-01-04: 100 ug via INTRAVENOUS

## 2019-01-04 MED ORDER — FENTANYL 2500MCG IN NS 250ML (10MCG/ML) PREMIX INFUSION
0.0000 ug/h | INTRAVENOUS | Status: DC
  Administered 2019-01-04: 350 ug/h via INTRAVENOUS
  Administered 2019-01-04: 50 ug/h via INTRAVENOUS
  Administered 2019-01-05 (×2): 400 ug/h via INTRAVENOUS
  Administered 2019-01-05 – 2019-01-06 (×3): 350 ug/h via INTRAVENOUS
  Administered 2019-01-06 – 2019-01-07 (×4): 400 ug/h via INTRAVENOUS
  Filled 2019-01-04 (×12): qty 250

## 2019-01-04 MED ORDER — METHYLPREDNISOLONE SODIUM SUCC 40 MG IJ SOLR
40.0000 mg | Freq: Two times a day (BID) | INTRAMUSCULAR | Status: DC
  Administered 2019-01-05: 40 mg via INTRAVENOUS
  Filled 2019-01-04 (×3): qty 1

## 2019-01-04 MED ORDER — FENTANYL CITRATE (PF) 100 MCG/2ML IJ SOLN
100.0000 ug | INTRAMUSCULAR | Status: DC | PRN
  Administered 2019-01-04 – 2019-01-08 (×11): 100 ug via INTRAVENOUS

## 2019-01-04 MED ORDER — INSULIN ASPART 100 UNIT/ML ~~LOC~~ SOLN
0.0000 [IU] | SUBCUTANEOUS | Status: DC
  Administered 2019-01-04 (×2): 3 [IU] via SUBCUTANEOUS
  Administered 2019-01-05: 2 [IU] via SUBCUTANEOUS
  Administered 2019-01-05: 3 [IU] via SUBCUTANEOUS

## 2019-01-04 MED ORDER — ETOMIDATE 2 MG/ML IV SOLN
20.0000 mg | Freq: Once | INTRAVENOUS | Status: AC
  Administered 2019-01-04: 20 mg via INTRAVENOUS

## 2019-01-04 MED ORDER — POTASSIUM CHLORIDE CRYS ER 20 MEQ PO TBCR
40.0000 meq | EXTENDED_RELEASE_TABLET | Freq: Three times a day (TID) | ORAL | Status: DC
  Administered 2019-01-04: 40 meq via ORAL
  Filled 2019-01-04: qty 2

## 2019-01-04 NOTE — Procedures (Signed)
OGT Placement By MD  Placed under direct laryngoscopy and verified via auscultation  Rush Farmer, M.D. [facility] Pulmonary/Critical Care Medicine. Pager: [phone]. After hours pager: [phone].

## 2019-01-04 NOTE — Progress Notes (Signed)
Patient transferred to 68M room #2.

## 2019-01-04 NOTE — Progress Notes (Signed)
Sputum sample sent to lab per MD order

## 2019-01-04 NOTE — Progress Notes (Signed)
Patient on Bipap  Rate of 16 and Fio2 40^% Not responsive to tactile stimuli and/or voice. Cough reflex present. Pupil response present: 2 mm in size reactive to light . Patient is also hypotensive (see flowsheet). Order noted for Levophed now running at 52mg/kg/min.  Transfer for ICU noted as per RRT.

## 2019-01-04 NOTE — Procedures (Signed)
Intubation Procedure Note Nicholas Caldwell [id number] Jan 13, 1946  Procedure: Intubation Indications: Airway protection and maintenance  Procedure Details Consent: Risks of procedure as well as the alternatives and risks of each were explained to the (patient/caregiver).  Consent for procedure obtained. Time Out: Verified patient identification, verified procedure, site/side was marked, verified correct patient position, special equipment/implants available, medications/allergies/relevent history reviewed, required imaging and test results available.  Performed  Maximum sterile technique was used including gloves, hand hygiene and mask.  MAC    Evaluation Hemodynamic Status: BP stable throughout; O2 sats: stable throughout Patient's Current Condition: stable Complications: No apparent complications Patient did tolerate procedure well. Chest X-ray ordered to verify placement.  CXR: pending.   Jennet Maduro 01/04/2019

## 2019-01-04 NOTE — Consult Note (Addendum)
NAME:  Nicholas Caldwell, MRN:  [id number], DOB:  June 30, 1946, LOS: 5 ADMISSION DATE:  12/30/2018, CONSULTATION DATE:  3/3 REFERRING MD:  Florene Glen, CHIEF COMPLAINT:  Acute hypoxic and hypercarbic respiratory failure    Brief History   73 year old male patient admitted on 2/27 with decompensated biventricular heart failure, and volume overload.  Treated in usual fashion which included: Supplemental oxygen, IV diuresis, and afterload reduction.  Critical care consulted on 3/3 when patient became progressively lethargic and hypercarbic.  History of present illness   73 year old male patient with a history of COPD, chronic respiratory failure, nonischemic cardiomyopathy now on his third hospital admission since January 2020.  His last hospital admission required intubation and mechanical ventilation for acute on chronic heart failure.  He was discharged on 2/9 2020 with a weight of 270 pounds.  He presented once again on 2/27 with worsening shortness of breath, a 10 to 15 pound weight gain, and chest x-ray consistent with pulmonary edema.  His initial BNP was 1273 creatinine 1.2.  He was admitted with a working diagnosis of decompensated heart failure, treated with IV diuresis, supplemental oxygen, bronchodilators, and supportive care.  In addition to this that heart failure team was consulted who assisted with management.  As of 3/3 he was not really improving much, on morning rounds he was found to be more lethargic. Over the course of the day he was getting more sleepy actually requesting CPAP.  By late afternoon critical care was consulted as patient was minimally responsive in spite of noxious/painful stimulus.  Review of arterial blood gas shows PCO2 up to 81.5 from 63.4, pH remains at 7.35 which is essentially unchanged  Past Medical History  Nonischemic cardiomyopathy, with left ventricular ejection fraction of 40 to 45%, chronic respiratory failure on nasal cannula at baseline.  Paroxysmal atrial  fibrillation, sleep apnea, hypertension, prior noncompliance.  Significant Hospital Events   2/27: Admitted with decompensated heart failure 3/3: Critical care consulted, patient obtunded, more hypercarbic, worsening respiratory failure. Consults:  Heart failure team 3/2 Critical care 3/2  Procedures:  Endotracheal tube 3/3 2/3: right and left heart cath: 1. Nonobstructive CAD. 2. Filling pressures fairly well optimized.  3. Preserved cardiac output. 4. Moderate pulmonary hypertension, suspect mixed pulmonary venous hypertension and PAH due to COPD.  PVR not significantly elevated.  Significant Diagnostic Tests:    Micro Data:  Respiratory culture 3/3  Antimicrobials:    Interim history/subjective:  Minimally responsive  Objective   Blood pressure (Abnormal) 114/47, pulse 85, temperature 98.1 F (36.7 C), temperature source Oral, resp. rate (Abnormal) 22, height 6' (1.829 m), weight 130.5 kg, SpO2 97 %.    FiO2 (%):  [32 %-40 %] 40 %   Intake/Output Summary (Last 24 hours) at 01/04/2019 1416 Last data filed at 01/04/2019 0419 Gross per 24 hour  Intake 240 ml  Output 1750 ml  Net -1510 ml   Filed Weights   01/02/19 0300 01/03/19 0300 01/04/19 0300  Weight: 132.5 kg 132.8 kg 130.5 kg    Examination: General: Obese 73 year old male patient minimally responsive, he will localize to noxious stimulus only at this point HENT: Normocephalic atraumatic mucous membranes moist no appreciable JVD Lungs: Decreased throughout, episodes of apnea appreciated.  Currently on BiPAP Cardiovascular: Regular rate and rhythm Abdomen: Obese Extremities: Lower extremity edema pulses palpable Neuro: Minimally respond GU:  due to void  Resolved Hospital Problem list     Assessment & Plan:   Acute metabolic encephalopathy.  I think almost certainly secondary to hypercarbia  Plan Holding sedation currently Plan for mechanical ventilation to support hypercarbia If he does not wake up after  initiation of ventilation will need CT head  Acute on chronic hypoxic and hypercarbic respiratory failure in the setting of decompensated cor pulmonale, and systolic and diastolic heart failure with resultant pulmonary edema. -I suspect this is primarily a complication of untreated sleep apnea assuming he is indeed taking all of his home medications Plan Transfer to intensive care Intubate/ventilate Follow-up ABG Will check sputum culture following intubation PAD protocol RASS goal -1 VAP bundle Resume diuresis once blood pressure will allow It will be imperative that he is discharged with BiPAP machine if we hope to keep him from being readmitted  Acute systolic and diastolic heart failure with pulmonary edema, now with cardiogenic shock Pulmonary hypertension -Suspect current hypotension may be exacerbated by acidemia Plan Intubate/ventilate Have started peripheral norepinephrine, will titrate for mean arterial pressure of greater than 65 May need central access to help steer volume removal Hold Lasix for now, have also discontinued Spironolactone, Diamox and afterload reduction.  COPD with chronic respiratory failure Plan Schedule Brovana and budesonide Will change Solu-Medrol to 40 q. 12 with plan to rapidly taper, I do not think this is COPD exacerbation  Contraction alkalosis in the setting of aggressive diuresis Plan Holding diuresis for now   History of paroxysmal atrial fibrillation Plan Avoid tachycardia Change DOAC to IV heparin Telemetry monitoring  Acute on chronic renal failure -Serum creatinine rising with diuresis.  With current hypotension suspect this will continue to worsen Plan Ensure mean arterial pressure greater than 65 Renal dose medications Holding anti-hypertensives and diuretics  Fluid and electrolyte imbalance: Hypokalemia Plan This is been replaced, will recheck in light of diuresis  Diabetes Plan Continuing 70/30 and sliding scale  insulin  Anemia of chronic illness No evidence of bleeding Plan Trend CBC Transfuse as indicated for hemoglobin less than 7  Best practice:  Diet: N.p.o. Pain/Anxiety/Delirium protocol (if indicated): Start 3/3 PAD protocol RASS goal -1 VAP protocol (if indicated): 3/3 DVT prophylaxis: The IV heparin in lieu of DO AC GI prophylaxis: PPI Glucose control: Sliding scale insulin Mobility: Bedrest Code Status: Full code Family Communication: Current plan of care discussed at length with wife Disposition: He is critically ill in the setting of decompensated untreated sleep apnea and obesity hypoventilation syndrome resulting in nocturnal hypoxia and eventually decompensated Cor pulmonale and then LV failure with pulmonary edema.  He is now hypercarbic.  Assuming he is indeed taking his medications as prescribed I suspect the missing piece of the puzzle here is ensuring he has nocturnal ventilation/BiPAP at time of discharge.  He is critically ill and requires mechanical ventilation at this point, also requires titration of vasoactive drips in the setting of cardiogenic shock.  We are moving him to the intensive care  Labs   CBC: Recent Labs  Lab 12/30/18 1621  12/31/18 0245 01/01/19 0246 01/02/19 0953 01/03/19 0301 01/04/19 0240  WBC 7.9  --  7.6 7.8 9.8 9.6 10.3  NEUTROABS 6.2  --   --   --   --   --   --   HGB 9.0*   < > 8.7* 8.3* 8.3* 8.3* 8.2*  HCT 32.3*   < > 31.0* 29.6* 28.7* 28.6* 28.5*  MCV 92.6  --  91.2 90.0 89.7 88.3 89.1  PLT 481*  --  448* 459* 438* 430* 424*   < > = values in this interval not displayed.    Basic Metabolic  Panel: Recent Labs  Lab 12/30/18 1742 12/31/18 0245 01/01/19 0246 01/02/19 0239 01/03/19 0301 01/04/19 0240  NA  --  139 140 143 139 141  K  --  4.5 3.7 3.3* 3.1* 3.2*  CL  --  96* 95* 91* 88* 89*  CO2  --  36* 34* 39* 41* 40*  GLUCOSE  --  68* 119* 139* 172* 152*  BUN  --  24* 25* 23 22 28*  CREATININE  --  1.16 1.19 1.23 1.35* 1.51*    CALCIUM  --  8.1* 8.0* 8.2* 8.2* 8.4*  MG 2.1  --  2.3 2.5* 2.4 2.5*   GFR: Estimated Creatinine Clearance: 63.6 mL/min (A) (by C-G formula based on SCr of 1.51 mg/dL (H)). Recent Labs  Lab 01/01/19 0246 01/02/19 0953 01/02/19 1907 01/03/19 0301 01/04/19 0240  PROCALCITON  --   --  <0.10 0.10 0.15  WBC 7.8 9.8  --  9.6 10.3    Liver Function Tests: Recent Labs  Lab 12/30/18 1621 01/01/19 0246 01/02/19 0953 01/03/19 0301 01/04/19 0240  AST _0 ALT _1 ALKPHOS 97 113 110 103 102  BILITOT 0.3 0.4 0.2* 0.4 0.3  PROT 6.6 6.4* 6.5 6.2* 6.9  ALBUMIN 3.1* 3.0* 2.9* 2.9* 3.1*   Recent Labs  Lab 01/02/19 0953  LIPASE 26   No results for input(s): AMMONIA in the last 168 hours.  ABG    Component Value Date/Time   PHART 7.353 01/04/2019 1248   PCO2ART 81.5 (HH) 01/04/2019 1248   PO2ART 99.2 01/04/2019 1248   HCO3 44.1 (H) 01/04/2019 1248   TCO2 38 (H) 12/30/2018 1639   ACIDBASEDEF 2.0 02/19/2015 1003   O2SAT 97.4 01/04/2019 1248     Coagulation Profile: Recent Labs  Lab 12/30/18 1621  INR 1.6*    Cardiac Enzymes: No results for input(s): CKTOTAL, CKMB, CKMBINDEX, TROPONINI in the last 168 hours.  HbA1C: Hgb A1c MFr Bld  Date/Time Value Ref Range Status  11/13/2018 06:02 AM 6.5 (H) 4.8 - 5.6 % Final    Comment:    (NOTE) Pre diabetes:          5.7%-6.4% Diabetes:              >6.4% Glycemic control for   <7.0% adults with diabetes   05/01/2018 05:10 AM 8.1 (H) 4.8 - 5.6 % Final    Comment:    (NOTE) Pre diabetes:          5.7%-6.4% Diabetes:              >6.4% Glycemic control for   <7.0% adults with diabetes     CBG: Recent Labs  Lab 01/03/19 1632 01/03/19 2113 01/04/19 0558 01/04/19 1134 01/04/19 1315  GLUCAP 118* 178* 160* 211* 154*    Review of Systems:   Not able  Past Medical History  He,  has a past medical history of Atrial flutter ([place]), CAD (coronary artery disease), Chronic pain, Chronic systolic  CHF (congestive heart failure) ([place]), COPD (chronic obstructive pulmonary disease) ([place]), Diabetes mellitus without complication ([place]), Hypercholesteremia, Hypertension, NICM (nonischemic cardiomyopathy) ([place]), PAF (paroxysmal atrial fibrillation) ([place]), Pulmonary hypertension ([place]), and Tobacco abuse.   Surgical History    Past Surgical History:  Procedure Laterality Date  . CARDIOVERSION N/A 07/30/2018   Procedure: CARDIOVERSION;  Surgeon: Larey Dresser, MD;  Location: [facility] ENDOSCOPY;  Service: Cardiovascular;  Laterality: N/A;  . LEFT AND RIGHT HEART CATHETERIZATION WITH CORONARY ANGIOGRAM N/A  11/06/2014   Procedure: LEFT AND RIGHT HEART CATHETERIZATION WITH CORONARY ANGIOGRAM;  Surgeon: Jettie Booze, MD;  Location: [facility] CATH LAB;  Service: Cardiovascular;  Laterality: N/A;  . RIGHT/LEFT HEART CATH AND CORONARY ANGIOGRAPHY N/A 12/06/2018   Procedure: RIGHT/LEFT HEART CATH AND CORONARY ANGIOGRAPHY;  Surgeon: Larey Dresser, MD;  Location: [facility] CV LAB;  Service: Cardiovascular;  Laterality: N/A;  . SPLENECTOMY       Social History   reports that he quit smoking about 5 weeks ago. His smoking use included cigarettes. He has a 34.00 pack-year smoking history. He has never used smokeless tobacco. He reports that he does not drink alcohol or use drugs.   Family History   His family history includes Heart disease in his father and mother; Heart failure in his mother; Hypertension in his mother and sister. There is no history of Heart attack or Stroke.   Allergies No Known Allergies   Home Medications  Prior to Admission medications   Medication Sig Start Date End Date Taking? Authorizing Provider  bisoprolol (ZEBETA) 5 MG tablet Take 0.5 tablets (2.5 mg total) by mouth daily for 30 days. 12/30/18 01/29/19 Yes TillerySatira Mccallum, PA-C  budesonide-formoterol [facility]) 160-4.5 MCG/ACT inhaler Inhale 2 puffs into the lungs 2 (two) times daily.   Yes [provider]    diazepam (VALIUM) 10 MG tablet Take 0.5 tablets (5 mg total) by mouth daily as needed for anxiety or sleep. Patient taking differently: Take 10 mg by mouth daily as needed for anxiety or sleep.  12/19/16  Yes Theodis Blaze, MD  ferrous sulfate 325 (65 FE) MG tablet Take 325 mg by mouth 3 (three) times daily as needed (for supplementation).   Yes [provider]  fluticasone (FLONASE) 50 MCG/ACT nasal spray Place 2 sprays into both nostrils daily as needed for allergies.    Yes [provider]  folic acid (FOLVITE) 1 MG tablet Take 1 mg by mouth daily.   Yes [provider]  gabapentin (NEURONTIN) 600 MG tablet Take 1 tablet (600 mg total) by mouth 2 (two) times daily. 12/19/16  Yes Theodis Blaze, MD  glucose 4 GM chewable tablet Chew 4 tablets by mouth See admin instructions. Chew 4 tablets by mouth as needed for low blood sugar - repeat every 15 minutes if blood sugar less than 70   Yes [provider]  guaifenesin (HUMIBID E) 400 MG TABS tablet Take 1 tablet (400 mg total) by mouth every 6 (six) hours as needed. Patient taking differently: Take 400 mg by mouth every 6 (six) hours as needed (cough).  08/02/18  Yes Regalado, Belkys A, MD  hydrocortisone (ANUSOL-HC) 2.5 % rectal cream Place 1 application rectally 2 (two) times daily as needed for hemorrhoids or itching.    Yes [provider]  hydrocortisone 1 % lotion Apply 1 application topically See admin instructions. Apply small amount to back twice daily for itchy rash   Yes [provider]  insulin aspart protamine - aspart (NOVOLOG MIX 70/30 FLEXPEN) (70-30) 100 UNIT/ML FlexPen Inject 0.05 mLs (5 Units total) into the skin 2 (two) times daily. Patient taking differently: Inject 30 Units into the skin 2 (two) times daily.  08/02/18  Yes Regalado, Belkys A, MD  ipratropium-albuterol (DUONEB) 0.5-2.5 (3) MG/3ML SOLN Take 3 mLs by nebulization 2 (two) times daily.   Yes [provider]   isosorbide-hydrALAZINE (BIDIL) 20-37.5 MG tablet Take 1 tablet by mouth 3 (three) times daily. 12/30/18  Yes Shirley Friar, PA-C  latanoprost (XALATAN) 0.005 % ophthalmic solution Place 1 drop into both eyes at bedtime.   Yes [provider]  Magnesium Oxide 420 (252 Mg) MG TABS Take 420 mg by mouth 2 (two) times daily.   Yes [provider]  metFORMIN (GLUCOPHAGE-XR) 500 MG 24 hr tablet Take 1,000 mg by mouth 2 (two) times daily with a meal.   Yes [provider]  oxyCODONE-acetaminophen (PERCOCET) 10-325 MG tablet Take 1 tablet by mouth every 6 (six) hours as needed for pain.   Yes [provider]  OXYGEN Inhale 3 L into the lungs continuous.    Yes [provider]  potassium chloride SA (K-DUR,KLOR-CON) 20 MEQ tablet Take 2 tablets (40 mEq total) by mouth every morning AND 1 tablet (20 mEq total) at bedtime. Patient taking differently: Take 2 tablets (40 mEq total) by mouth every morning  AND 1 tablet (20 mEq total) at bedtime. 11/26/18  Yes Amin, Jeanella Flattery, MD  rivaroxaban (XARELTO) 20 MG TABS tablet Take 1 tablet (20 mg total) by mouth daily with supper. Patient taking differently: Take 20 mg by mouth daily at 6 PM.  01/20/17  Yes Jettie Booze, MD  rosuvastatin (CRESTOR) 20 MG tablet Take 1 tablet (20 mg total) by mouth daily. 05/07/18  Yes Matilde Haymaker, MD  torsemide (DEMADEX) 20 MG tablet Take 4 tablets (80 mg total) by mouth 2 (two) times daily for 30 days. 11/26/18 12/30/18 Yes Damita Lack, MD     Critical care time:  87 minutes    Erick Colace ACNP-BC [place] Pager # [phone] OR # [phone] if no answer  Attending Note:  73 year old male with morbid obesity and COPD who presents to PCCM with AMS and inability to protect his airway on exam.  I reviewed CXR myself, decreased lung volumes noted.  Discussed with PCCM-NP.  Will transfer to the medical ICU.  Intubate upon arrival.  F/U ABG.   Adjust vent for ABG.  Light sedation as needed.  PCCM will continue to follow.  The patient is critically ill with multiple organ systems failure and requires high complexity decision making for assessment and support, frequent evaluation and titration of therapies, application of advanced monitoring technologies and extensive interpretation of multiple databases.   Critical Care Time devoted to patient care services described in this note is  35  Minutes. This time reflects time of care of this signee Dr Jennet Maduro. This critical care time does not reflect procedure time, or teaching time or supervisory time of PA/NP/Med student/Med Resident etc but could involve care discussion time.  Rush Farmer, M.D. [facility] [place] Pulmonary/Critical Care Medicine. Pager: [phone]. After hours pager: [phone].

## 2019-01-04 NOTE — Progress Notes (Signed)
PROGRESS NOTE    Nicholas Caldwell  IRS:[id number] DOB: [birth year] DOA: 12/30/2018 PCP: [facility]   Brief Narrative:  [phone] w/ Dragon Thrush hx of combined systolic and diastolic heart failure (81-82% by TTE 2020),paroxysmal Nicholas Caldwell. fib, pulmonary hypertension, nonischemic cardiomyopathy, COPD, and chronic hypoxic respiratory failure on 3 L of oxygen who presented w/ worsening shortness of breath over Nicholas Caldwell couple of days w/ Nicholas Caldwell 10-15 pound weight gain over 2-3 weeks.   Assessment & Plan:   Active Problems:   Essential hypertension   Type 2 diabetes mellitus with neuropathy   Dyspnea   PAF (paroxysmal atrial fibrillation) (HCC)   Nonischemic cardiomyopathy (HCC)   CAD - Non-obstructive by LHC1/16   Acute on chronic combined systolic and diastolic CHF (congestive heart failure) (HCC)   Heart failure (HCC)  Altered Mental Status:  Suspect 2/2 hypercarbia, though normal pH, borderline.  He's been sleepy this AM. Narcan 0.4 x 3 without response He's received oxy/gabapentin earlier Valium on 3/2 PM ABG 7.353/81.5/99.2 Respiratory for bipap Initially, GCS ~11, opening eyes to voice and withdrawing from pain and following some commands.  Now not opening eyes and requires greater stimuli to withdraw, GCS ~7.  Concern for ability to protect airway.  Discussed with PCCM who will evaluate.    Acute on chronic hypercarbic/hypoxemic respiratory failure Chronically on 3 L oxygen Diuresing as noted below Will add on steroids for COPD exacerbation with wheezing CT chest with small R effusion with compressive atelectasis (see report). At this point in time, lower suspicion for infection.  Normal WBC count and low procalcitonin.  Afebrile.  Will continue to monitor off abx. Schedule nebs for COPD below as some wheezing on exam today  Acute on chronic combined systolic and diastolic heart failure exacerbation Dry weight he thinks is around 268 lbs when he feels good (~122 kg).  Weight at discharge during  last hospitalization was 122.8 kg Suspected cause is dietary noncompliance, he reported compliance with meds On torsemide 80 BID at home Lasix 80 TID with diamox per cards Negative 3 L yesterday, -13.8 L over hospitalization Weight down to 130.5 Appreciate cards recs  Pulmonary HTN: suspected mixed pulmonary venous HTN and PAH from COPD due to COPD on cath from 2/3.  Needs outpatient sleep study.    Metabolic Alkalosis: VBG with elevated pCO2, suspect this is 2/2 chronic respiratory acidosis with contribution from contraction alkalosis.  Paroxysmal Nicholas Caldwell. Fib continue Xarelto - previously failed amiodarone due to markedly prolonged QT - not tikosyn candidate for same reason  COPD continue nebulizer treatment, Symbicort Schedule nebs Steroids, IV [name] outpatient sleep study  Chronic pain, anxiety Continue with oxycodone, and Valium as per home doses   Obesity - Body mass index is 38.42 kg/m.   DM Follow CBG and SSI Continue 70/30 10 units BID (caution if pt NPO at any time)  Hypokalemia: replace and follow  Abdominal Discomfort: c/o pain that he describes as like ulcer.  He's focused on pepcid as the only medication that helps in this situation.  Continue bentyl.   Bowel regimen CT scan from yesterday without acute findings, but notable for lymphadenopathy  Lymphadenopathy: abdominal CT with enlarged LN in retroperitoneum and upper abdomen.  Possibly reactive?   ESR 49 CRP 0.9  LDH 304 Chest CT with several small LN throughout chest as well. Needs outpatient follow up. Needs oncology follow up  Small Pulmonary Nodules: follow outpatient   Acute Kidney Injury: 1.51 in setting of diuresis and contrast.  Continue to monitor closely.  Follow indeterminate L adrenal nodule as outpatient  DVT prophylaxis: xarelto Code Status: full  Family Communication: none at bedside Disposition Plan: To ICU 45 min critical care given AMS/acute hypercarbic resp  failure  Consultants:   none  Procedures:   none  Antimicrobials:  Anti-infectives (From admission, onward)   Start     Dose/Rate Route Frequency Ordered Stop   01/02/19 2200  ceFEPIme (MAXIPIME) 1 g in sodium chloride 0.9 % 100 mL IVPB  Status:  Discontinued     1 g 200 mL/hr over 30 Minutes Intravenous Every 8 hours 01/02/19 1842 01/02/19 1842      Subjective: Initially opens eyes to voice and follows commands Gave narcan x3 Subsequently, no longer opening eyes to voice and requiring more stimulation to withdraw from pain  Objective: Vitals:   01/04/19 0300 01/04/19 0733 01/04/19 0738 01/04/19 1332  BP: (!) 126/55   (!) 114/47  Pulse: 89 87  85  Resp: 16 19  (!) 22  Temp: 98.1 F (36.7 C)     TempSrc: Oral     SpO2: 100% 97% 97% 97%  Weight: 130.5 kg     Height:        Intake/Output Summary (Last 24 hours) at 01/04/2019 1335 Last data filed at 01/04/2019 0419 Gross per 24 hour  Intake 240 ml  Output 1750 ml  Net -1510 ml   Filed Weights   01/02/19 0300 01/03/19 0300 01/04/19 0300  Weight: 132.5 kg 132.8 kg 130.5 kg    Examination:  General: somnolent Cardiovascular: rrr, no appreciated mgr Lungs: expiratory wheezing anteriorly Abdomen: s/nt/protuberant Neurological: somnolent, PERRL, moves all extremities.  Initially squeezed both hands to command. Skin: warm and dry Extremities: trace edema  Data Reviewed: I have personally reviewed following labs and imaging studies  CBC: Recent Labs  Lab 12/30/18 1621  12/31/18 0245 01/01/19 0246 01/02/19 0953 01/03/19 0301 01/04/19 0240  WBC 7.9  --  7.6 7.8 9.8 9.6 10.3  NEUTROABS 6.2  --   --   --   --   --   --   HGB 9.0*   < > 8.7* 8.3* 8.3* 8.3* 8.2*  HCT 32.3*   < > 31.0* 29.6* 28.7* 28.6* 28.5*  MCV 92.6  --  91.2 90.0 89.7 88.3 89.1  PLT 481*  --  448* 459* 438* 430* 424*   < > = values in this interval not displayed.   Basic Metabolic Panel: Recent Labs  Lab 12/30/18 1742 12/31/18 0245  01/01/19 0246 01/02/19 0239 01/03/19 0301 01/04/19 0240  NA  --  139 140 143 139 141  K  --  4.5 3.7 3.3* 3.1* 3.2*  CL  --  96* 95* 91* 88* 89*  CO2  --  36* 34* 39* 41* 40*  GLUCOSE  --  68* 119* 139* 172* 152*  BUN  --  24* 25* 23 22 28*  CREATININE  --  1.16 1.19 1.23 1.35* 1.51*  CALCIUM  --  8.1* 8.0* 8.2* 8.2* 8.4*  MG 2.1  --  2.3 2.5* 2.4 2.5*   GFR: Estimated Creatinine Clearance: 63.6 mL/min (Estrella Alcaraz) (by C-G formula based on SCr of 1.51 mg/dL (H)). Liver Function Tests: Recent Labs  Lab 12/30/18 1621 01/01/19 0246 01/02/19 0953 01/03/19 0301 01/04/19 0240  AST _0 ALT _1 ALKPHOS 97 113 110 103 102  BILITOT 0.3 0.4 0.2* 0.4 0.3  PROT 6.6 6.4* 6.5 6.2* 6.9  ALBUMIN 3.1*  3.0* 2.9* 2.9* 3.1*   Recent Labs  Lab 01/02/19 0953  LIPASE 26   No results for input(s): AMMONIA in the last 168 hours. Coagulation Profile: Recent Labs  Lab 12/30/18 1621  INR 1.6*   Cardiac Enzymes: No results for input(s): CKTOTAL, CKMB, CKMBINDEX, TROPONINI in the last 168 hours. BNP (last 3 results) No results for input(s): PROBNP in the last 8760 hours. HbA1C: No results for input(s): HGBA1C in the last 72 hours. CBG: Recent Labs  Lab 01/03/19 1059 01/03/19 1632 01/03/19 2113 01/04/19 0558 01/04/19 1134  GLUCAP 181* 118* 178* 160* 211*   Lipid Profile: No results for input(s): CHOL, HDL, LDLCALC, TRIG, CHOLHDL, LDLDIRECT in the last 72 hours. Thyroid Function Tests: No results for input(s): TSH, T4TOTAL, FREET4, T3FREE, THYROIDAB in the last 72 hours. Anemia Panel: No results for input(s): VITAMINB12, FOLATE, FERRITIN, TIBC, IRON, RETICCTPCT in the last 72 hours. Sepsis Labs: Recent Labs  Lab 01/02/19 1907 01/03/19 0301 01/04/19 0240  PROCALCITON <0.10 0.10 0.15    Recent Results (from the past 240 hour(s))  MRSA PCR Screening     Status: None   Collection Time: 12/30/18  6:53 PM  Result Value Ref Range Status   MRSA by PCR NEGATIVE  NEGATIVE Final    Comment:        The GeneXpert MRSA Assay (FDA approved for NASAL specimens only), is one component of Anokhi Shannon comprehensive MRSA colonization surveillance program. It is not intended to diagnose MRSA infection nor to guide or monitor treatment for MRSA infections. Performed at [facility], [place] [address] [place], [place] [address]          Radiology Studies: Ct Chest Wo Contrast  Result Date: 01/03/2019 CLINICAL DATA:  73 year old with shortness of breath. History of systolic and diastolic heart failure. EXAM: CT CHEST WITHOUT CONTRAST TECHNIQUE: Multidetector CT imaging of the chest was performed following the standard protocol without IV contrast. COMPARISON:  Abdominal CT 01/02/2019 FINDINGS: Cardiovascular: Atherosclerotic calcifications in the coronary arteries and thoracic aorta. Heart is enlarged. Small amount of pericardial fluid. Mediastinum/Nodes: Large number of small lymph nodes scattered throughout the mediastinum. Index lymph node in the pretracheal region measures 1.3 cm in the short axis on sequence 3, image 59. Probable small hilar lymph nodes but limited evaluation on this noncontrast examination. No significant axillary lymph node enlargement. Lungs/Pleura: Trachea and mainstem bronchi are patent. Centrilobular and paraseptal emphysema. Small right pleural effusion. Volume loss in the right lower lobe. Mild septal thickening in the lungs. 3 mm nodule in right upper lobe on sequence 5, image 56. 3 mm nodule near the right minor fissure on sequence 5, image 80. Significant volume loss in the left lower lobe. 7 x 6 mm nodule near the left lung apex on sequence 5 image 23. 6 mm nodule at the left lung apex on image 31. 8 x 6 mm nodule in the left upper lobe on sequence 5 image 34. Upper Abdomen: Again noted are scattered areas of soft tissue in the left upper quadrant probably representing accessory spleens following the splenectomy. Again noted is thickening  in the left adrenal gland with Hounsfield units measuring roughly 32 and this cannot be considered Amadeo Coke benign adenoma based on these imaging findings. Again noted are small lymph nodes in the upper abdomen. Images of the upper abdomen are limited due to motion. Musculoskeletal: Bridging osteophytes in thoracic spine. No acute bone abnormality. IMPRESSION: 1. Small right pleural effusion with compressive atelectasis in the right lower lobe.  2. Volume loss in left lower lobe. 3. Several small pulmonary nodules. Largest pulmonary nodule measures 8 mm in the left upper lobe. Nodules could be inflammatory but indeterminate. Non-contrast chest CT at 3-6 months is recommended. If the nodules are stable at time of repeat CT, then future CT at 18-24 months (from today's scan) is considered optional for low-risk patients, but is recommended for high-risk patients. This recommendation follows the consensus statement: Guidelines for Management of Incidental Pulmonary Nodules Detected on CT Images: From the Fleischner Society 2017; Radiology 2017; 284:228-243. 4. Aortic Atherosclerosis (ICD10-I70.0) and Emphysema (ICD10-J43.9). Cardiomegaly. 5. Mild septal thickening in the lungs. These findings could be related to mild or chronic pulmonary edema. 6. Several small lymph nodes scattered throughout the chest. Lymph nodes are nonspecific but may be reactive. Recommend attention on follow up imaging. 7. Indeterminate left adrenal nodule/thickening. Electronically Signed   By: Markus Daft M.D.   On: 01/03/2019 14:04   Ct Abdomen Pelvis W Contrast  Result Date: 01/03/2019 CLINICAL DATA:  Acute abdominal pain. EXAM: CT ABDOMEN AND PELVIS WITH CONTRAST TECHNIQUE: Multidetector CT imaging of the abdomen and pelvis was performed using the standard protocol following bolus administration of intravenous contrast. CONTRAST:  155m OMNIPAQUE IOHEXOL 300 MG/ML  SOLN COMPARISON:  Renal ultrasound 12/01/2018, no prior abdominal CT. FINDINGS:  Lower chest: Small to moderate right and small left pleural effusion. Associated compressive atelectasis in the lower lobes. Cardiomegaly with small pericardial effusion. Hepatobiliary: No focal hepatic abnormality. Gallbladder physiologically distended, no calcified stone. No biliary dilatation. Pancreas: No ductal dilatation or inflammation. Spleen: Post splenectomy with multifocal splenic deposits in the left upper quadrant. Soft tissue deposits in the lesser sac may be splenosis or lymph nodes. Adrenals/Urinary Tract: Possible 2 cm left adrenal nodule versus adjacent splenic deposit. The right adrenal gland appears normal. No hydronephrosis. No visualized nephrolithiasis. Absent renal excretion on delayed phase imaging. Urinary bladder is physiologically distended, no bladder wall thickening. Stomach/Bowel: Stomach is nondistended. Normal positioning of the duodenal jejunal junction. No small bowel dilatation, obstruction or inflammation. Mild fecalization of mid small bowel contents. Administered enteric contrast reaches the distal small bowel. Normal appendix. Moderate volume of stool throughout the colon. There is significant sigmoid colonic tortuosity. Vascular/Lymphatic: Aorto bi-iliac atherosclerosis. No aneurysm. Prominent and mildly enlarged retroperitoneal and upper abdominal lymph nodes, anterior caval node measures 14 mm short axis at the level of the gallbladder. Multiple additional prominent retroperitoneal nodes. Difficult to differentiate lymph nodes from splenosis related to splenectomy. There are small pericardial nodes. Reproductive: Streak artifact from left hip arthroplasty partially obscures the prostate gland. Other: Retroperitoneal stranding greatest in the lower abdomen/pelvis. No frank ascites. No free air. Flank edema, left greater than right. Musculoskeletal: Degenerative change in the spine. Degenerative change in the right hip with avascular necrosis, no subchondral collapse. Left  hip arthroplasty. IMPRESSION: 1. No acute findings or explanation for abdominal pain. 2. Multiple prominent and enlarged lymph nodes in the retroperitoneum and upper abdomen, likely reactive but nonspecific. Additionally patient is post splenectomy with multifocal splenosis and splenic deposits in the upper abdomen, which complicates lymph node evaluation. There are no prior exams available for comparison to evaluate for stability. Recommend clinical and laboratory evaluation for lymphoproliferative disorder. The absence of prior imaging, recommend CT follow-up in 3 months. 3. Absent renal excretion on delayed phase imaging consistent with renal dysfunction. 4. Small to moderate right and small left pleural effusions. Cardiomegaly with small pericardial effusion. 5.  Aortic Atherosclerosis (ICD10-I70.0). Electronically Signed   By:  Keith Rake M.D.   On: 01/03/2019 00:35   US Abdomen Limited  Result Date: 01/03/2019 CLINICAL DATA:  Splenectomy EXAM: LIMITED ABDOMEN ULTRASOUND FOR ASCITES TECHNIQUE: Limited ultrasound survey for ascites was performed in all four abdominal quadrants. COMPARISON:  CT 01/02/2019 FINDINGS: No significant ascites visualized within the abdomen or pelvis. IMPRESSION: Negative for significant ascites by ultrasound. Electronically Signed   By: Donavan Foil M.D.   On: 01/03/2019 20:15        Scheduled Meds: . dextrose      . dicyclomine  20 mg Oral TID AC & HS  . famotidine  20 mg Oral BID  . folic acid  1 mg Oral Daily  . furosemide  80 mg Intravenous TID  . gabapentin  600 mg Oral BID  . insulin aspart  0-5 Units Subcutaneous QHS  . insulin aspart  0-9 Units Subcutaneous TID WC  . insulin aspart protamine- aspart  10 Units Subcutaneous BID WC  . ipratropium-albuterol  3 mL Nebulization TID  . isosorbide-hydrALAZINE  1 tablet Oral TID  . latanoprost  1 drop Both Eyes QHS  . magnesium oxide  400 mg Oral BID  . mouth rinse  15 mL Mouth Rinse BID  .  methylPREDNISolone (SOLU-MEDROL) injection  60 mg Intravenous Q8H  . mometasone-formoterol  2 puff Inhalation BID  . polyethylene glycol  17 g Oral Daily  . potassium chloride  40 mEq Oral TID  . rivaroxaban  20 mg Oral q1800  . rosuvastatin  20 mg Oral q1800  . spironolactone  25 mg Oral Daily   Continuous Infusions:   LOS: 5 days    Time spent: over 30 min    Fayrene Helper, MD [facility] Pager [email or web address]  If 7PM-7AM, please contact night-coverage [email or web address] Password [email or web address] 01/04/2019, 1:35 PM

## 2019-01-04 NOTE — Care Management Note (Signed)
Case Management Note  Patient Details  Name: Nicholas Caldwell MRN: [id number] Date of Birth: August 20, 1946  Subjective/Objective:   Pt is a readmit with HF                 Action/Plan:   PTA from home.  Pt active with [facility] and PT - agency informed of admit.  Pt is on home oxygen 3 liters.  CM notified today that pt has [facility] benefits;  CM left VM for [facility] transfer coordinator informing of admit.  Per HF team pt will be admitted into the para-medicine program at discharge   Expected Discharge Date:                  Expected Discharge Plan:  [place]  In-House Referral:     Discharge planning Services  CM Consult  Post Acute Care Choice:    Choice offered to:     DME Arranged:    DME Agency:     HH Arranged:    [place] Agency:     Status of Service:  In process, will continue to follow  If discussed at Long Length of Stay Meetings, dates discussed:    Additional Comments: CM informed by [facility] that pt is active however he/his wife rarely allows them to complete [facility] visits.- [facility] will resume services post discharge.    CM discussed the importance of compliance with all treatment regimens, HF will also reinforce the need for compliance.   [name], RN 01/04/2019, 1:35 PM

## 2019-01-04 NOTE — Progress Notes (Signed)
Physical Therapy Treatment Patient Details Name: Nicholas Caldwell MRN: [id number] DOB: 11/16/1945 Today's Date: 01/04/2019    History of Present Illness Pt is a 73 y.o. M with significant PMH of combined systolic and diastolic heart failure, a. fib, pulmonary hypertension, nonischemic cardiomyoathy, COPD, and chronic hypoxic respiratory failure on 3L O2 who presents with worsening shortness of breath associated with 10-15 pound weight gain.    PT Comments    Patient seen for therapy intervention and OOB mobility. Patient performed in room ambulation but very frustrated with therapy services. Patient stating "every time you all come, you make me walk." Explained roll of therapy services in regards to patient goals to improve strength and function. Patient remains frustrated. Offered to attempt to schedule therapy services later in the afternoon for patient, to which patient replied "I just won't do anything." Asked patient if he would prefer therapy not work with patient for which he responded that he does still want to work with PT. Reaffirmed goals with patient. Will continue to see and progress as tolerated.   Follow Up Recommendations  Home health PT;Supervision/Assistance - 24 hour     Equipment Recommendations  None recommended by PT    Recommendations for Other Services       Precautions / Restrictions Precautions Precautions: Fall Restrictions Weight Bearing Restrictions: No    Mobility  Bed Mobility Overal bed mobility: Needs Assistance Bed Mobility: Supine to Sit     Supine to sit: Mod assist     General bed mobility comments: modA for trunk elevation, Pt able to adjust hips EOB  Transfers Overall transfer level: Needs assistance Equipment used: None Transfers: Sit to/from [facility] Sit to Stand: Min guard;Min assist;From elevated surface Stand pivot transfers: Min guard       General transfer comment: min guard A for balance and  safety  Ambulation/Gait Ambulation/Gait assistance: Min guard Gait Distance (Feet): 15 Feet(5 steps time 3 trials) Assistive device: Rolling walker (2 wheeled);4-wheeled walker Gait Pattern/deviations: Trunk flexed Gait velocity: decreased   General Gait Details: slow, labored, heavy BUE weight bearing on RW    Stairs             Wheelchair Mobility    Modified Rankin (Stroke Patients Only)       Balance Overall balance assessment: Needs assistance Sitting-balance support: Feet supported;Bilateral upper extremity supported Sitting balance-Leahy Scale: Fair     Standing balance support: Single extremity supported Standing balance-Leahy Scale: Poor Standing balance comment: reliant on UE support                            Cognition Arousal/Alertness: Awake/alert Behavior During Therapy: WFL for tasks assessed/performed;Agitated   Area of Impairment: Safety/judgement;Awareness;Problem solving                   Current Attention Level: Selective   Following Commands: Follows one step commands with increased time Safety/Judgement: Decreased awareness of safety;Decreased awareness of deficits   Problem Solving: Slow processing;Requires verbal cues        Exercises      General Comments        Pertinent Vitals/Pain Pain Assessment: Faces Faces Pain Scale: Hurts little more Pain Location: LE discomfort Pain Descriptors / Indicators: Discomfort;Grimacing;Constant Pain Intervention(s): Limited activity within patient's tolerance    Home Living                      Prior Function  PT Goals (current goals can now be found in the care plan section) Acute Rehab PT Goals Patient Stated Goal: return home PT Goal Formulation: With patient Time For Goal Achievement: 01/16/19 Potential to Achieve Goals: Fair Progress towards PT goals: Not progressing toward goals - comment(self limiting)    Frequency    Min  3X/week      PT Plan Current plan remains appropriate    Co-evaluation              AM-PAC PT "6 Clicks" Mobility   Outcome Measure  Help needed turning from your back to your side while in a flat bed without using bedrails?: A Little Help needed moving from lying on your back to sitting on the side of a flat bed without using bedrails?: A Little Help needed moving to and from a bed to a chair (including a wheelchair)?: A Little Help needed standing up from a chair using your arms (e.g., wheelchair or bedside chair)?: A Little Help needed to walk in hospital room?: A Lot Help needed climbing 3-5 steps with a railing? : A Lot 6 Click Score: 16    End of Session Equipment Utilized During Treatment: Oxygen Activity Tolerance: Patient limited by fatigue Patient left: in bed;with call bell/phone within reach;with family/visitor present Nurse Communication: Mobility status PT Visit Diagnosis: Other abnormalities of gait and mobility (R26.89);Muscle weakness (generalized) (M62.81)     Time: 8309-4076 PT Time Calculation (min) (ACUTE ONLY): 21 min  Charges:  $Therapeutic Activity: 8-22 mins                     Alben Deeds, PT DPT  Board Certified Neurologic Specialist [place] Pager [phone] Office [phone]    Duncan Dull 01/04/2019, 9:28 AM

## 2019-01-04 NOTE — Procedures (Signed)
Central Venous Catheter Insertion Procedure Note Nicholas Caldwell [id number] 1946-03-03  Procedure: Insertion of Central Venous Catheter Indications: Drug and/or fluid administration and Frequent blood sampling  Procedure Details Consent: Risks of procedure as well as the alternatives and risks of each were explained to the (patient/caregiver).  Consent for procedure obtained. Time Out: Verified patient identification, verified procedure, site/side was marked, verified correct patient position, special equipment/implants available, medications/allergies/relevent history reviewed, required imaging and test results available.  Performed  Maximum sterile technique was used including antiseptics, cap, gloves, gown, hand hygiene, mask and sheet. Skin prep: Chlorhexidine; local anesthetic administered A antimicrobial bonded/coated triple lumen catheter was placed in the right internal jugular vein using the Seldinger technique.  Evaluation Blood flow good Complications: No apparent complications Patient did tolerate procedure well. Chest X-ray ordered to verify placement.  CXR: pending.  Cristal Generous MSN, AGACNP-BC  01/04/2019, 4:49 PM

## 2019-01-04 NOTE — Progress Notes (Addendum)
Advanced Heart Failure Rounding Note  PCP-Cardiologist: No primary care provider on file.   Subjective:    Yesterday he was diuresed with IV lasix and started on diamox. Brisk diuresis noted. Negative 3  liters.   Denies SOB.    Objective:   Weight Range: 130.5 kg Body mass index is 39.02 kg/m.   Vital Signs:   Temp:  [97.3 F (36.3 C)-98.4 F (36.9 C)] 98.1 F (36.7 C) (03/03 0300) Pulse Rate:  [87-97] 87 (03/03 0733) Resp:  [16-23] 19 (03/03 0733) BP: (105-126)/(41-61) 126/55 (03/03 0300) SpO2:  [61 %-100 %] 97 % (03/03 0738) FiO2 (%):  [32 %] 32 % (03/03 0738) Weight:  [130.5 kg] 130.5 kg (03/03 0300) Last BM Date: 01/01/19(per patient)  Weight change: Filed Weights   01/02/19 0300 01/03/19 0300 01/04/19 0300  Weight: 132.5 kg 132.8 kg 130.5 kg    Intake/Output:   Intake/Output Summary (Last 24 hours) at 01/04/2019 0827 Last data filed at 01/04/2019 0419 Gross per 24 hour  Intake 240 ml  Output 3250 ml  Net -3010 ml      Physical Exam    General:   No resp difficulty HEENT: Normal Neck: Supple. JVP to jaw  . Carotids 2+ bilat; no bruits. No lymphadenopathy or thyromegaly appreciated. Cor: PMI nondisplaced. Regular rate & rhythm. No rubs, gallops or murmurs. Lungs: Clear Abdomen: Soft, nontender, distended. No hepatosplenomegaly. No bruits or masses. Good bowel sounds. Extremities: No cyanosis, clubbing, rash, edema Neuro: Drowsy, cranial nerves grossly intact. moves all 4 extremities w/o difficulty. Affect pleasant   Telemetry   SR PACs occasional PVCs 90s  EKG    N/A   Labs    CBC Recent Labs    01/03/19 0301 01/04/19 0240  WBC 9.6 10.3  HGB 8.3* 8.2*  HCT 28.6* 28.5*  MCV 88.3 89.1  PLT 430* 250*   Basic Metabolic Panel Recent Labs    01/03/19 0301 01/04/19 0240  NA 139 141  K 3.1* 3.2*  CL 88* 89*  CO2 41* 40*  GLUCOSE 172* 152*  BUN 22 28*  CREATININE 1.35* 1.51*  CALCIUM 8.2* 8.4*  MG 2.4 2.5*   Liver Function  Tests Recent Labs    01/03/19 0301 01/04/19 0240  AST 16 15  ALT 11 12  ALKPHOS 103 102  BILITOT 0.4 0.3  PROT 6.2* 6.9  ALBUMIN 2.9* 3.1*   Recent Labs    01/02/19 0953  LIPASE 26   Cardiac Enzymes No results for input(s): CKTOTAL, CKMB, CKMBINDEX, TROPONINI in the last 72 hours.  BNP: BNP (last 3 results) Recent Labs    11/21/18 2238 12/01/18 0015 12/30/18 1621  BNP 1,739.7* 1,666.7* 1,273.0*    ProBNP (last 3 results) No results for input(s): PROBNP in the last 8760 hours.   D-Dimer No results for input(s): DDIMER in the last 72 hours. Hemoglobin A1C No results for input(s): HGBA1C in the last 72 hours. Fasting Lipid Panel No results for input(s): CHOL, HDL, LDLCALC, TRIG, CHOLHDL, LDLDIRECT in the last 72 hours. Thyroid Function Tests No results for input(s): TSH, T4TOTAL, T3FREE, THYROIDAB in the last 72 hours.  Invalid input(s): FREET3  Other results:   Imaging    Ct Chest Wo Contrast  Result Date: 01/03/2019 CLINICAL DATA:  73 year old with shortness of breath. History of systolic and diastolic heart failure. EXAM: CT CHEST WITHOUT CONTRAST TECHNIQUE: Multidetector CT imaging of the chest was performed following the standard protocol without IV contrast. COMPARISON:  Abdominal CT 01/02/2019 FINDINGS: Cardiovascular:  Atherosclerotic calcifications in the coronary arteries and thoracic aorta. Heart is enlarged. Small amount of pericardial fluid. Mediastinum/Nodes: Large number of small lymph nodes scattered throughout the mediastinum. Index lymph node in the pretracheal region measures 1.3 cm in the short axis on sequence 3, image 59. Probable small hilar lymph nodes but limited evaluation on this noncontrast examination. No significant axillary lymph node enlargement. Lungs/Pleura: Trachea and mainstem bronchi are patent. Centrilobular and paraseptal emphysema. Small right pleural effusion. Volume loss in the right lower lobe. Mild septal thickening in the  lungs. 3 mm nodule in right upper lobe on sequence 5, image 56. 3 mm nodule near the right minor fissure on sequence 5, image 80. Significant volume loss in the left lower lobe. 7 x 6 mm nodule near the left lung apex on sequence 5 image 23. 6 mm nodule at the left lung apex on image 31. 8 x 6 mm nodule in the left upper lobe on sequence 5 image 34. Upper Abdomen: Again noted are scattered areas of soft tissue in the left upper quadrant probably representing accessory spleens following the splenectomy. Again noted is thickening in the left adrenal gland with Hounsfield units measuring roughly 32 and this cannot be considered a benign adenoma based on these imaging findings. Again noted are small lymph nodes in the upper abdomen. Images of the upper abdomen are limited due to motion. Musculoskeletal: Bridging osteophytes in thoracic spine. No acute bone abnormality. IMPRESSION: 1. Small right pleural effusion with compressive atelectasis in the right lower lobe. 2. Volume loss in left lower lobe. 3. Several small pulmonary nodules. Largest pulmonary nodule measures 8 mm in the left upper lobe. Nodules could be inflammatory but indeterminate. Non-contrast chest CT at 3-6 months is recommended. If the nodules are stable at time of repeat CT, then future CT at 18-24 months (from today's scan) is considered optional for low-risk patients, but is recommended for high-risk patients. This recommendation follows the consensus statement: Guidelines for Management of Incidental Pulmonary Nodules Detected on CT Images: From the Fleischner Society 2017; Radiology 2017; 284:228-243. 4. Aortic Atherosclerosis (ICD10-I70.0) and Emphysema (ICD10-J43.9). Cardiomegaly. 5. Mild septal thickening in the lungs. These findings could be related to mild or chronic pulmonary edema. 6. Several small lymph nodes scattered throughout the chest. Lymph nodes are nonspecific but may be reactive. Recommend attention on follow up imaging. 7.  Indeterminate left adrenal nodule/thickening. Electronically Signed   By: Markus Daft M.D.   On: 01/03/2019 14:04   US Abdomen Limited  Result Date: 01/03/2019 CLINICAL DATA:  Splenectomy EXAM: LIMITED ABDOMEN ULTRASOUND FOR ASCITES TECHNIQUE: Limited ultrasound survey for ascites was performed in all four abdominal quadrants. COMPARISON:  CT 01/02/2019 FINDINGS: No significant ascites visualized within the abdomen or pelvis. IMPRESSION: Negative for significant ascites by ultrasound. Electronically Signed   By: Donavan Foil M.D.   On: 01/03/2019 20:15      Medications:     Scheduled Medications: . dextrose      . acetaZOLAMIDE  250 mg Oral BID  . dicyclomine  20 mg Oral TID AC & HS  . famotidine  20 mg Oral BID  . folic acid  1 mg Oral Daily  . furosemide  80 mg Intravenous TID  . gabapentin  600 mg Oral BID  . insulin aspart  0-5 Units Subcutaneous QHS  . insulin aspart  0-9 Units Subcutaneous TID WC  . insulin aspart protamine- aspart  10 Units Subcutaneous BID WC  . ipratropium-albuterol  3 mL Nebulization TID  .  isosorbide-hydrALAZINE  1 tablet Oral TID  . latanoprost  1 drop Both Eyes QHS  . magnesium oxide  400 mg Oral BID  . mouth rinse  15 mL Mouth Rinse BID  . mometasone-formoterol  2 puff Inhalation BID  . polyethylene glycol  17 g Oral Daily  . potassium chloride  20 mEq Oral BID  . rivaroxaban  20 mg Oral q1800  . rosuvastatin  20 mg Oral q1800  . spironolactone  12.5 mg Oral Daily     Infusions:   PRN Medications:  acetaminophen, albuterol, alum & mag hydroxide-simeth, diazepam, ferrous sulfate, fluticasone, oxyCODONE-acetaminophen **AND** oxyCODONE, senna-docusate    Patient Profile   Deangleo Passage a 73 y.o.malePMH of chronic systolic CHF/nonischemic cardiomyopathy Echo 12/13/2016 LVEF 40-45%, COPD on home oxygen, PAF and history of non compliance.   Admitted with A/C respiratory failure and A/C systolic heart failure.  Assessment/Plan  1. A/C  Respiratory Failure On chronic 3 liters oxygen  CXR on admit with bilateral small effusions and atelectasis.   Continue antibiotics, nebs, per primary team. Steroids per primary   Sed Rate 49 . Procalcitonin 0.15   2. A/C Systolic Heart Failure  ECHO Ef 40-45%  Volume status remains elevated. Continue lasix to 80 mg tid.  --CO2---> 41>40  - Day 2/3 diamox 250 mg twice a day -Follow renal function closely.   3. PAF Maintaining NSR.   On Xarelto 20 mg daily.  Not tikosyn or amiodarone due to Qt prolongation.   4. Anemia  -H/O GI bleed and received PRBCs 2/8 -Hgb 8.2  -No s/s bleeding.  Continue ferrous sulfate.  - Follow CBC daily  5. Alkalosis: Suspect compensation for respiratory acidosis.   Continue diamox 250 mg twice a day. Day 2/3   When he is discharged he will need HF Paramedicine.  Medication concerns reviewed with patient and pharmacy team. Barriers identified: N/A  Length of Stay: [place], NP  01/04/2019, 8:27 AM  Advanced Heart Failure Team Pager [phone] (M-F; 7a - 4p)  Please contact [facility] for night-coverage after hours (4p -7a ) and weekends on [email or web address]  Patient seen with NP, agree with the above note.   He diuresed well yesterday but still wheezy.  No dyspnea at rest.    No ascites on abdominal US.   On exam, JVP difficult but still appears elevated.  Trace ankle edema.  Moderate abdominal distention.  Diffuse wheezing bilaterally.   1. Acute/chronic systolic CHF: Nonischemic cardiomyopathy based on cath in 2/20.  Echo in 1/20 with EF 40-45%.  Severe COPD on home oxygen is a confounder. Diuresed well yesterday but still volume overloaded. No ascites on abdominal US.  - Continue Lasix 80 mg IV every 8 hrs + acetazolamide 250 mg bid x 3 days with elevated HCO3.  Replace K.  - Continue Bidil 1 tab tid.  - Can increase spironolactone to 25 daily.  - Hold bisoprolol with volume overload and wheezing.  - Think he would be a good  Cardiomems candidate.  2. Atrial fibrillation: Paroxysmal.  He is in NSR today.  - Has not been felt to be a candidate for Tikosyn and amiodarone due to QT prolongation and severe lung disease.  3. COPD: Wears home oxygen at baseline. Suspect co-existing COPD exacerbation with diffuse wheezing. CXR with left base infiltrate but PCT not elevated, no WBC elevation, no fever.  CT chest with bibasilar atelectasis (no definite PNA), mild pulmonary edema, and underlying COPD.  - Continue Duonebs scheduled, may  need to consider addition of steroids (per primary service).  - Suspect not PNA, would consider, however, treatment with abx (?doxycycline) for COPD exacerbation.  4. Anemia: H/o GI bleeding but no overt bleeding currently.  Follow CBC.   Loralie Champagne 01/04/2019 8:47 AM

## 2019-01-04 NOTE — Progress Notes (Signed)
[place] for heparin Indication: atrial fibrillation  No Known Allergies  Patient Measurements: Height: 6' (182.9 cm) Weight: 287 lb 11.2 oz (130.5 kg) IBW/kg (Calculated) : 77.6 Heparin Dosing Weight: 107 kg  Vital Signs: Temp: 98.1 F (36.7 C) (03/03 0300) Temp Source: Oral (03/03 0300) BP: 114/47 (03/03 1332) Pulse Rate: 85 (03/03 1332)  Labs: Recent Labs    01/02/19 0239  01/02/19 0953 01/03/19 0301 01/04/19 0240  HGB  --    < > 8.3* 8.3* 8.2*  HCT  --   --  28.7* 28.6* 28.5*  PLT  --   --  438* 430* 424*  CREATININE 1.23  --   --  1.35* 1.51*   < > = values in this interval not displayed.    Estimated Creatinine Clearance: 63.6 mL/min (A) (by C-G formula based on SCr of 1.51 mg/dL (H)).   Medical History: Past Medical History:  Diagnosis Date  . Atrial flutter ([place])    a. recurrent AFlutter with RVR;  b. Amiodarone Rx started 4/16  . CAD (coronary artery disease)    a. LHC 1/16:  mLAD diffuse disease, pLCx mild disease, dLCx with disease but too small for PCI, RCA ok, EF 25-30%  . Chronic pain   . Chronic systolic CHF (congestive heart failure) ([place])   . COPD (chronic obstructive pulmonary disease) ([place])   . Diabetes mellitus without complication ([place])   . Hypercholesteremia   . Hypertension   . NICM (nonischemic cardiomyopathy) ([place])    a.dx 2016. b. 2D echo 06/2016 - Last echo 07/01/16: mod dilated LV, mod LVH, EF 25-30%, mild-mod MR, sev LAE, mild-mod reduced RV systolic function, mild-mod TR, PASP 65mHG.  .Marland KitchenPAF (paroxysmal atrial fibrillation) ([facility])    On amio - ot a candidate for flecainide due to cardiomyopathy, not a candidate for Tikosyn due to prolonged QT, and felt to be a poor candidate for ablation given left atrial size.  . Pulmonary hypertension ([place]   . Tobacco abuse     Medications:  Scheduled:  . dextrose      . dicyclomine  20 mg Oral TID AC & HS  . famotidine  20 mg Oral BID  . folic acid  1  mg Oral Daily  . furosemide  80 mg Intravenous TID  . gabapentin  600 mg Oral BID  . insulin aspart  0-5 Units Subcutaneous QHS  . insulin aspart  0-9 Units Subcutaneous TID WC  . insulin aspart protamine- aspart  10 Units Subcutaneous BID WC  . ipratropium-albuterol  3 mL Nebulization TID  . latanoprost  1 drop Both Eyes QHS  . magnesium oxide  400 mg Oral BID  . mouth rinse  15 mL Mouth Rinse BID  . methylPREDNISolone (SOLU-MEDROL) injection  60 mg Intravenous Q8H  . mometasone-formoterol  2 puff Inhalation BID  . polyethylene glycol  17 g Oral Daily  . potassium chloride  40 mEq Oral TID  . rosuvastatin  20 mg Oral q1800  . spironolactone  25 mg Oral Daily    Assessment: 89yom admitted with respiratory and systolic heart failure, on Xarelto PTA for Afib (last dose on 3/2_0 ). Presented with AMS with inability to protect his airway - now intubated and transferred to 74M.   Central lines already placed. Okay to start heparin infusion 24 hours after last dose of Xarelto. Hgb 9.9, plt 424. No s/sx of bleeding.   Will monitor both aPTT and heparin level until correlate given  recent Xarelto administration.   Goal of Therapy:  Heparin level 0.3-0.7 units/ml aPTT 66-102 seconds Monitor platelets by anticoagulation protocol: Yes   Plan:  Start heparin infusion at 1500 units/hr on 3/3 at 1800 Check anti-Xa and aPTT level in 8 hours and daily while on heparin Continue to monitor H&H and platelets  Antonietta Jewel, PharmD, [place] Clinical Pharmacist  Pager: [phone] Phone: [phone] 01/04/2019,2:42 PM

## 2019-01-05 ENCOUNTER — Inpatient Hospital Stay (HOSPITAL_COMMUNITY): Payer: Medicare Other

## 2019-01-05 ENCOUNTER — Telehealth (HOSPITAL_COMMUNITY): Payer: Self-pay | Admitting: Surgery

## 2019-01-05 LAB — COMPREHENSIVE METABOLIC PANEL
ALT: 15 U/L (ref 0–44)
AST: 24 U/L (ref 15–41)
Albumin: 3.1 g/dL — ABNORMAL LOW (ref 3.5–5.0)
Alkaline Phosphatase: 112 U/L (ref 38–126)
Anion gap: 12 (ref 5–15)
BILIRUBIN TOTAL: 0.6 mg/dL (ref 0.3–1.2)
BUN: 32 mg/dL — ABNORMAL HIGH (ref 8–23)
CO2: 31 mmol/L (ref 22–32)
CREATININE: 1.36 mg/dL — AB (ref 0.61–1.24)
Calcium: 8.8 mg/dL — ABNORMAL LOW (ref 8.9–10.3)
Chloride: 96 mmol/L — ABNORMAL LOW (ref 98–111)
GFR calc non Af Amer: 52 mL/min — ABNORMAL LOW (ref >60)
Glucose, Bld: 207 mg/dL — ABNORMAL HIGH (ref 70–99)
Potassium: 3.8 mmol/L (ref 3.5–5.1)
Sodium: 139 mmol/L (ref 135–145)
Total Protein: 7.1 g/dL (ref 6.5–8.1)

## 2019-01-05 LAB — BLOOD GAS, ARTERIAL
Acid-Base Excess: 10.1 mmol/L — ABNORMAL HIGH (ref 0.0–2.0)
Bicarbonate: 33.3 mmol/L — ABNORMAL HIGH (ref 20.0–28.0)
Drawn by: 34762
FIO2: 40
MECHVT: 620 mL
O2 Saturation: 95.2 %
PEEP: 5 cmH2O
PO2 ART: 72.3 mmHg — AB (ref 83.0–108.0)
Patient temperature: 99.8
RATE: 24 resp/min
pCO2 arterial: 38.4 mmHg (ref 32.0–48.0)
pH, Arterial: 7.55 — ABNORMAL HIGH (ref 7.350–7.450)

## 2019-01-05 LAB — GLUCOSE, CAPILLARY
Glucose-Capillary: 135 mg/dL — ABNORMAL HIGH (ref 70–99)
Glucose-Capillary: 167 mg/dL — ABNORMAL HIGH (ref 70–99)
Glucose-Capillary: 186 mg/dL — ABNORMAL HIGH (ref 70–99)
Glucose-Capillary: 189 mg/dL — ABNORMAL HIGH (ref 70–99)
Glucose-Capillary: 200 mg/dL — ABNORMAL HIGH (ref 70–99)
Glucose-Capillary: 207 mg/dL — ABNORMAL HIGH (ref 70–99)

## 2019-01-05 LAB — CBC
HCT: 30.8 % — ABNORMAL LOW (ref 39.0–52.0)
Hemoglobin: 8.9 g/dL — ABNORMAL LOW (ref 13.0–17.0)
MCH: 25.1 pg — ABNORMAL LOW (ref 26.0–34.0)
MCHC: 28.9 g/dL — ABNORMAL LOW (ref 30.0–36.0)
MCV: 86.8 fL (ref 80.0–100.0)
NRBC: 0 % (ref 0.0–0.2)
Platelets: 465 10*3/uL — ABNORMAL HIGH (ref 150–400)
RBC: 3.55 MIL/uL — ABNORMAL LOW (ref 4.22–5.81)
RDW: 18.6 % — ABNORMAL HIGH (ref 11.5–15.5)
WBC: 8.2 10*3/uL (ref 4.0–10.5)

## 2019-01-05 LAB — HEMOGLOBIN AND HEMATOCRIT, BLOOD
HCT: 30.2 % — ABNORMAL LOW (ref 39.0–52.0)
HCT: 30.1 % — ABNORMAL LOW (ref 39.0–52.0)
Hemoglobin: 8.8 g/dL — ABNORMAL LOW (ref 13.0–17.0)
Hemoglobin: 8.8 g/dL — ABNORMAL LOW (ref 13.0–17.0)

## 2019-01-05 LAB — APTT: aPTT: 72 seconds — ABNORMAL HIGH (ref 24–36)

## 2019-01-05 LAB — COOXEMETRY PANEL
Carboxyhemoglobin: 1.7 % — ABNORMAL HIGH (ref 0.5–1.5)
Methemoglobin: 1.5 % (ref 0.0–1.5)
O2 Saturation: 79.3 %
Total hemoglobin: 9.2 g/dL — ABNORMAL LOW (ref 12.0–16.0)

## 2019-01-05 LAB — MAGNESIUM: Magnesium: 2.7 mg/dL — ABNORMAL HIGH (ref 1.7–2.4)

## 2019-01-05 LAB — PHOSPHORUS: Phosphorus: 2.9 mg/dL (ref 2.5–4.6)

## 2019-01-05 LAB — HEPARIN LEVEL (UNFRACTIONATED): Heparin Unfractionated: 0.93 IU/mL — ABNORMAL HIGH (ref 0.30–0.70)

## 2019-01-05 MED ORDER — INSULIN ASPART 100 UNIT/ML ~~LOC~~ SOLN
3.0000 [IU] | SUBCUTANEOUS | Status: DC
  Administered 2019-01-05 (×3): 6 [IU] via SUBCUTANEOUS
  Administered 2019-01-06: 3 [IU] via SUBCUTANEOUS
  Administered 2019-01-06 (×2): 9 [IU] via SUBCUTANEOUS
  Administered 2019-01-06: 6 [IU] via SUBCUTANEOUS
  Administered 2019-01-06: 3 [IU] via SUBCUTANEOUS
  Administered 2019-01-06: 6 [IU] via SUBCUTANEOUS
  Administered 2019-01-06: 3 [IU] via SUBCUTANEOUS
  Administered 2019-01-07: 6 [IU] via SUBCUTANEOUS
  Administered 2019-01-07 (×4): 9 [IU] via SUBCUTANEOUS
  Administered 2019-01-07 – 2019-01-08 (×3): 6 [IU] via SUBCUTANEOUS
  Administered 2019-01-08 (×2): 3 [IU] via SUBCUTANEOUS
  Administered 2019-01-08 – 2019-01-09 (×4): 9 [IU] via SUBCUTANEOUS
  Administered 2019-01-09: 6 [IU] via SUBCUTANEOUS
  Administered 2019-01-09 (×2): 9 [IU] via SUBCUTANEOUS
  Administered 2019-01-10 (×3): 3 [IU] via SUBCUTANEOUS
  Administered 2019-01-10: 6 [IU] via SUBCUTANEOUS
  Administered 2019-01-10 – 2019-01-11 (×4): 9 [IU] via SUBCUTANEOUS
  Administered 2019-01-11 (×2): 6 [IU] via SUBCUTANEOUS
  Administered 2019-01-11: 3 [IU] via SUBCUTANEOUS
  Administered 2019-01-11: 6 [IU] via SUBCUTANEOUS
  Administered 2019-01-11: 3 [IU] via SUBCUTANEOUS
  Administered 2019-01-12: 6 [IU] via SUBCUTANEOUS
  Administered 2019-01-12 (×2): 3 [IU] via SUBCUTANEOUS

## 2019-01-05 MED ORDER — METHYLPREDNISOLONE SODIUM SUCC 40 MG IJ SOLR
20.0000 mg | Freq: Two times a day (BID) | INTRAMUSCULAR | Status: DC
  Administered 2019-01-05 – 2019-01-07 (×4): 20 mg via INTRAVENOUS
  Filled 2019-01-05 (×5): qty 0.5

## 2019-01-05 MED ORDER — HYDRALAZINE HCL 25 MG PO TABS
25.0000 mg | ORAL_TABLET | Freq: Three times a day (TID) | ORAL | Status: DC
  Filled 2019-01-05 (×4): qty 1

## 2019-01-05 MED ORDER — LABETALOL HCL 5 MG/ML IV SOLN
10.0000 mg | INTRAVENOUS | Status: DC | PRN
  Filled 2019-01-05: qty 4

## 2019-01-05 MED ORDER — IPRATROPIUM-ALBUTEROL 0.5-2.5 (3) MG/3ML IN SOLN
3.0000 mL | Freq: Four times a day (QID) | RESPIRATORY_TRACT | Status: DC
  Administered 2019-01-05 – 2019-01-07 (×8): 3 mL via RESPIRATORY_TRACT
  Filled 2019-01-05 (×7): qty 3

## 2019-01-05 MED ORDER — FUROSEMIDE 10 MG/ML IJ SOLN
40.0000 mg | Freq: Two times a day (BID) | INTRAMUSCULAR | Status: DC
  Administered 2019-01-05 – 2019-01-07 (×5): 40 mg via INTRAVENOUS
  Filled 2019-01-05 (×5): qty 4

## 2019-01-05 MED ORDER — PANTOPRAZOLE SODIUM 40 MG IV SOLR
40.0000 mg | Freq: Two times a day (BID) | INTRAVENOUS | Status: DC
  Administered 2019-01-05 – 2019-01-06 (×3): 40 mg via INTRAVENOUS
  Filled 2019-01-05 (×3): qty 40

## 2019-01-05 MED ORDER — ISOSORBIDE DINITRATE 10 MG PO TABS
10.0000 mg | ORAL_TABLET | Freq: Three times a day (TID) | ORAL | Status: DC
  Filled 2019-01-05 (×4): qty 1

## 2019-01-05 NOTE — Progress Notes (Signed)
[place] for heparin Indication: atrial fibrillation  No Known Allergies  Patient Measurements: Height: 6' (182.9 cm) Weight: 267 lb 6.7 oz (121.3 kg) IBW/kg (Calculated) : 77.6 Heparin Dosing Weight: 107 kg  Vital Signs: Temp: 99.8 F (37.7 C) (03/04 0344) Temp Source: Oral (03/04 0344) BP: 157/59 (03/04 0430) Pulse Rate: 72 (03/04 0430)  Labs: Recent Labs    01/03/19 0301 01/04/19 0240 01/04/19 1710 01/04/19 1950 01/05/19 0414 01/05/19 0430  HGB 8.3* 8.2* 9.9* 8.5* 8.9*  --   HCT 28.6* 28.5* 29.0* 29.9* 30.8*  --   PLT 430* 424*  --  430* 465*  --   APTT  --   --   --  31  --  72*  HEPARINUNFRC  --   --   --  1.48*  --  0.93*  CREATININE 1.35* 1.51*  --   --   --   --     Estimated Creatinine Clearance: 61.2 mL/min (A) (by C-G formula based on SCr of 1.51 mg/dL (H)).  Assessment: 73 y.o. male with h/o Afib, Xarelto on hold, for heparin  Goal of Therapy:  Heparin level 0.3-0.7 units/ml aPTT 66-102 seconds Monitor platelets by anticoagulation protocol: Yes   Plan:  Continue Heparin at current rate   Phillis Knack, PharmD, BCPS  01/05/2019,5:21 AM

## 2019-01-05 NOTE — Progress Notes (Signed)
Patient's OGT has had black, dark green, and dark red secretions throughout the night. Will continue to monitor.

## 2019-01-05 NOTE — Progress Notes (Addendum)
Advanced Heart Failure Rounding Note  PCP-Cardiologist: No primary care provider on file.   Subjective:    Yesterday he was hypoxic and required intubation.   Remains intubated/sedated.   Objective:   Weight Range: 121.3 kg Body mass index is 36.27 kg/m.   Vital Signs:   Temp:  [98.7 F (37.1 C)-99.8 F (37.7 C)] 99.1 F (37.3 C) (03/04 0700) Pulse Rate:  [68-100] 74 (03/04 0800) Resp:  [12-31] 24 (03/04 0800) BP: (75-162)/(44-71) 162/71 (03/04 0800) SpO2:  [91 %-100 %] 95 % (03/04 0800) FiO2 (%):  [40 %-100 %] 40 % (03/04 0800) Weight:  [121.3 kg] 121.3 kg (03/04 0154) Last BM Date: 01/01/19  Weight change: Filed Weights   01/03/19 0300 01/04/19 0300 01/05/19 0154  Weight: 132.8 kg 130.5 kg 121.3 kg    Intake/Output:   Intake/Output Summary (Last 24 hours) at 01/05/2019 1043 Last data filed at 01/05/2019 0800 Gross per 24 hour  Intake 985.03 ml  Output 2790 ml  Net -1804.97 ml      Physical Exam    General: Intubated. No resp difficulty HEENT: ETT Neck: supple. Hard to assess. Carotids 2+ bilat; no bruits. No lymphadenopathy or thryomegaly appreciated. Cor: PMI nondisplaced. Regular rate & rhythm. No rubs, gallops or murmurs. Lungs: clear Abdomen: soft, nontender, distended. No hepatosplenomegaly. No bruits or masses. Good bowel sounds. Extremities: no cyanosis, clubbing, rash, Ra dn LLE scds  edema Neuro: intubated sedated    Telemetry   SR PACs  EKG    N/A   Labs    CBC Recent Labs    01/04/19 1950 01/05/19 0414  WBC 9.2 8.2  HGB 8.5* 8.9*  HCT 29.9* 30.8*  MCV 90.1 86.8  PLT 430* 030*   Basic Metabolic Panel Recent Labs    01/04/19 0240 01/04/19 1710 01/05/19 0430  NA 141 139 139  K 3.2* 3.4* 3.8  CL 89*  --  96*  CO2 40*  --  31  GLUCOSE 152*  --  207*  BUN 28*  --  32*  CREATININE 1.51*  --  1.36*  CALCIUM 8.4*  --  8.8*  MG 2.5*  --  2.7*  PHOS  --   --  2.9   Liver Function Tests Recent Labs    01/04/19 0240  01/05/19 0430  AST 15 24  ALT 12 15  ALKPHOS 102 112  BILITOT 0.3 0.6  PROT 6.9 7.1  ALBUMIN 3.1* 3.1*   No results for input(s): LIPASE, AMYLASE in the last 72 hours. Cardiac Enzymes No results for input(s): CKTOTAL, CKMB, CKMBINDEX, TROPONINI in the last 72 hours.  BNP: BNP (last 3 results) Recent Labs    11/21/18 2238 12/01/18 0015 12/30/18 1621  BNP 1,739.7* 1,666.7* 1,273.0*    ProBNP (last 3 results) No results for input(s): PROBNP in the last 8760 hours.   D-Dimer No results for input(s): DDIMER in the last 72 hours. Hemoglobin A1C No results for input(s): HGBA1C in the last 72 hours. Fasting Lipid Panel No results for input(s): CHOL, HDL, LDLCALC, TRIG, CHOLHDL, LDLDIRECT in the last 72 hours. Thyroid Function Tests No results for input(s): TSH, T4TOTAL, T3FREE, THYROIDAB in the last 72 hours.  Invalid input(s): FREET3  Other results:   Imaging    Dg Chest Port 1 View  Result Date: 01/05/2019 CLINICAL DATA:  History of endotracheal tube EXAM: PORTABLE CHEST 1 VIEW COMPARISON:  Yesterday FINDINGS: Endotracheal tube tip at the clavicular heads. The orogastric tube at least reaches the stomach. Right  IJ line with tip at the upper cavoatrial junction region. Cardiomegaly and diffuse lung opacity asymmetric to the left. Left diaphragm may be elevated. No pneumothorax. IMPRESSION: 1. Stable hardware positioning. 2. Unchanged extensive opacity with atelectasis and vascular congestion seen on chest CT 2 days ago Electronically Signed   By: Monte Fantasia M.D.   On: 01/05/2019 07:48   Portable Chest X-ray  Result Date: 01/04/2019 CLINICAL DATA:  72 y/o  M; encounter for central line placement. EXAM: PORTABLE CHEST 1 VIEW COMPARISON:  01/03/2019 CT chest. 01/02/2019 chest radiograph. FINDINGS: Small right and moderate left pleural effusions. Left basilar opacity. Diffuse hazy opacities of the lungs. Findings similar to prior chest radiograph given differences in  technique. Endotracheal tube tip projects 3.8 cm above the carina. Right central venous catheter tip projects over mid SVC. Enteric tube extends below the field of view into the abdomen. No pneumothorax. Aortic arch calcific atherosclerosis. No acute osseous abnormality is evident. Cardiomegaly, partially obscured by left basilar opacity. IMPRESSION: 1. Right central venous catheter tip projects over mid SVC. Enteric tube extends below the field of view into the abdomen. Enteric tube tip projects 3.9 cm above carina. 2. Small right and moderate left pleural effusions, left basilar consolidation, and diffuse hazy opacities of the lungs are stable from prior chest radiograph. Electronically Signed   By: Kristine Garbe M.D.   On: 01/04/2019 17:01     Medications:     Scheduled Medications: . chlorhexidine gluconate (MEDLINE KIT)  15 mL Mouth Rinse BID  . furosemide  40 mg Intravenous Q12H  . insulin aspart  3-9 Units Subcutaneous Q4H  . ipratropium-albuterol  3 mL Nebulization Q6H  . latanoprost  1 drop Both Eyes QHS  . mouth rinse  15 mL Mouth Rinse 10 times per day  . methylPREDNISolone (SOLU-MEDROL) injection  20 mg Intravenous Q12H  . pantoprazole (PROTONIX) IV  40 mg Intravenous Q12H    Infusions: . sodium chloride 10 mL/hr at 01/04/19 1700  . dexmedetomidine (PRECEDEX) IV infusion 1 mcg/kg/hr (01/05/19 1009)  . fentaNYL infusion INTRAVENOUS 400 mcg/hr (01/05/19 1012)  . norepinephrine (LEVOPHED) Adult infusion Stopped (01/04/19 1600)    PRN Medications: albuterol, fentaNYL (SUBLIMAZE) injection, labetalol    Patient Profile   Nicholas Caldwell a 73 y.o.malePMH of chronic systolic CHF/nonischemic cardiomyopathy Echo 12/13/2016 LVEF 40-45%, COPD on home oxygen, PAF and history of non compliance.   Admitted with A/C respiratory failure and A/C systolic heart failure.  Assessment/Plan  1. A/C  Hypoxic/Hypercapnic Respiratory Failure Intubated 01/04/19  On steroids.     2. A/C Systolic Heart Failure  ECHO Ef 40-45%  Start checking CVPs and CO-OX .  Volume status remains elevated. Continue lasix 40 mg twice a day.   --CO2---> 41>40>31   3. PAF Maintaining NSR. -Off xarelto/heprain with coffee ground emesis.  Not tikosyn or amiodarone due to Qt prolongation.   4. Anemia  -H/O GI bleed and received PRBCs 2/8 -Hgb 8.9 stable. Had coffee ground emesis in OF tube. Started on protonix  Follow CBC daily  5. Alkalosis: Suspect compensation for respiratory acidosis.   Had 2 days of diamox. CO2 31  Length of Stay: 6  [name], NP  01/05/2019, 10:43 AM  Advanced Heart Failure Team Pager [phone] (M-F; 7a - 4p)  Please contact [facility] for night-coverage after hours (4p -7a ) and weekends on [email or web address]  Patient seen with NP, agree with the above note.   Intubated yesterday for hypercarbic respiratory failure.   Continues to  diurese well, CVP not set up this morning.  Co-ox 79%.  Creatinine lower. Having coffee grounds in OGT, heparin gtt held.   On exam, JVP difficult but still appears elevated. Trace ankle edema. Moderate abdominal distention. Diffuse wheezing bilaterally.   1. Acute/chronic systolic CHF: Nonischemic cardiomyopathy based on cath in 2/20. Echo in 1/20 with EF 40-45%. Severe COPD/OHS/OSA on home oxygen is a confounder. He continues to diurese on IV Lasix. - Continue Lasix 40 mg IV bid for now.  - Set up CVP. - BP elevated, will give hydralazine 25 mg tid with isordil 10 tid while intubated.  - Can continue spironolactone 12.5 daily.  - Hold bisoprolol with volume overload and wheezing. - Think he would be a good Cardiomems candidate eventually.  2. Atrial fibrillation: Paroxysmal. He is in NSR today.  On Xarelto at home.  - Has not been felt to be a candidate for Tikosyn and amiodarone due to QT prolongation and severe lung disease.  - Holding heparin with coffee grounds in OGT.  3. COPD: Wears home oxygen at  baseline. Suspect co-existing COPD exacerbation with diffuse wheezing. CXR with left base infiltrate but PCT not elevated, no WBC elevation, no fever.CT chest with bibasilar atelectasis (no definite PNA), mild pulmonary edema, and underlying COPD. - Continue nebs and steroids per CCM.  - Suspect not PNA, not on abx.  4. Anemia: H/o GI bleeding.  Hemoglobin stable but with coffee grounds in OGT.   - Holding anticoagulation, may need GI evaluation.  5. OHS/OSA: Hypercarbic respiratory failure in setting of OHS/OSA and COPD.  OSA diagnosed on sleep study, but having a lot of trouble getting CPAP from the [place].  Will need to see if we can get outside of [place].  - Vent management per CCM, will need CPAP long-term.   Loralie Champagne 01/05/2019 11:16 AM

## 2019-01-05 NOTE — Progress Notes (Signed)
NAME:  Nicholas Caldwell, MRN:  [id number], DOB:  Oct 08, 1946, LOS: 6 ADMISSION DATE:  12/30/2018, CONSULTATION DATE:  3/3 REFERRING MD:  Florene Glen, CHIEF COMPLAINT:  Acute hypoxic and hypercarbic respiratory failure    Brief History   73 yo male presented with volume overload from acute on chronic biventricular CHF.  Developed progressive hypoxia, hypercapnia, and altered mental status.  Transferred to ICU 3/03 and intubated.  Third hospitalization since January 2020.   Past Medical History  Nonischemic cardiomyopathy, with left ventricular ejection fraction of 40 to 45%, chronic respiratory failure on nasal cannula at baseline,  Paroxysmal atrial fibrillation, sleep apnea, hypertension, prior noncompliance, HLD, s/p splenectomy  Significant Hospital Events   2/27: Admitted with decompensated heart failure 3/03: Critical care consulted, patient obtunded, more hypercarbic, worsening respiratory failure.  Consults:  Heart failure team 3/2  Procedures:  Endotracheal tube 3/3 >>   Significant Diagnostic Tests:  LHC/RHC 12/06/18 >> nonobstructive CAD, mod pulmonary HTN, PVR not significantly elevated CT chest 01/03/19 >> small/mod Rt and small Lt effusion, compressive ATX, CM  Micro Data:  Respiratory culture 3/3 >>   Antimicrobials:    Interim history/subjective:  Coffee ground material from OG tube.  Low Vt, hypoxia with SBT.  Objective   Blood pressure (!) 162/71, pulse 74, temperature 99.1 F (37.3 C), temperature source Oral, resp. rate (!) 24, height 6' (1.829 m), weight 121.3 kg, SpO2 95 %.    Vent Mode: PRVC FiO2 (%):  [40 %-100 %] 40 % Set Rate:  [24 bmp] 24 bmp Vt Set:  [320 mL-620 mL] 320 mL PEEP:  [5 cmH20] 5 cmH20 Pressure Support:  [15 cmH20] 15 cmH20 Plateau Pressure:  [25 cmH20-32 cmH20] 28 cmH20   Intake/Output Summary (Last 24 hours) at 01/05/2019 0925 Last data filed at 01/05/2019 0800 Gross per 24 hour  Intake 985.03 ml  Output 2790 ml  Net -1804.97 ml   Filed  Weights   01/03/19 0300 01/04/19 0300 01/05/19 0154  Weight: 132.8 kg 130.5 kg 121.3 kg    Examination:  General - sedated Eyes - pupils reactive ENT - ETT in place Cardiac - regular rate/rhythm, no murmur Chest - decreased BS, scattered rhonchi, no wheeze Abdomen - soft, non tender, decreased bowel sounds Extremities - 1+ edema Skin - no rashes Neuro - RASS -1  CXR (reviewed by me) - b/l ATX   Resolved Hospital Problem list     Assessment & Plan:   Acute on chronic hypoxic/hypercapnic respiratory failure. OSA/OHS. COPD. Pleural effusions with atelectasis. Plan - full vent support - f/u CXR, ABG - scheduled BDs - wean off solumedrol  Acute on chronic combined CHF with cor pulmonale. Paroxysmal A fib on xarelto as outpt. HTN, HLD. Prolonged QTc. Plan - lasix 40 mg bid - prn labetalol for SBP > 160, DBP > 105 - heart failure team following - hold heparin gtt with concern for GI bleeding  Acute metabolic encephalopathy 2nd to respiratory failure. Plan - RASS goal 0 to -1  Coffee ground material from OG tube. Plan - add protonix BID - f/u Hb - if progressive anemia or increased outpt from OG tube, then will need GI consult - hold tube feeds for now  DM type II. Plan - SSI  Anemia of critical illness and chronic disease. Plan - f/u CBC - transfuse for Hb < 7   Best practice:  Diet: NPO; hold tube feeds until sure not having GI bleed DVT prophylaxis: SCDs GI prophylaxis: Protonix Mobility: Bedrest Code Status: Full  code Family Communication: No family at bedside Disposition: ICU  Labs    CMP Latest Ref Rng & Units 01/05/2019 01/04/2019 01/04/2019  Glucose 70 - 99 mg/dL 207(H) - 152(H)  BUN 8 - 23 mg/dL 32(H) - 28(H)  Creatinine 0.61 - 1.24 mg/dL 1.36(H) - 1.51(H)  Sodium 135 - 145 mmol/L 139 139 141  Potassium 3.5 - 5.1 mmol/L 3.8 3.4(L) 3.2(L)  Chloride 98 - 111 mmol/L 96(L) - 89(L)  CO2 22 - 32 mmol/L 31 - 40(H)  Calcium 8.9 - 10.3 mg/dL  8.8(L) - 8.4(L)  Total Protein 6.5 - 8.1 g/dL 7.1 - 6.9  Total Bilirubin 0.3 - 1.2 mg/dL 0.6 - 0.3  Alkaline Phos 38 - 126 U/L 112 - 102  AST 15 - 41 U/L 24 - 15  ALT 0 - 44 U/L 15 - 12   CBC Latest Ref Rng & Units 01/05/2019 01/04/2019 01/04/2019  WBC 4.0 - 10.5 K/uL 8.2 9.2 -  Hemoglobin 13.0 - 17.0 g/dL 8.9(L) 8.5(L) 9.9(L)  Hematocrit 39.0 - 52.0 % 30.8(L) 29.9(L) 29.0(L)  Platelets 150 - 400 K/uL 465(H) 430(H) -   ABG    Component Value Date/Time   PHART 7.550 (H) 01/05/2019 0345   PCO2ART 38.4 01/05/2019 0345   PO2ART 72.3 (L) 01/05/2019 0345   HCO3 33.3 (H) 01/05/2019 0345   TCO2 47 (H) 01/04/2019 1710   ACIDBASEDEF 2.0 02/19/2015 1003   O2SAT 79.3 01/05/2019 0425   CBG (last 3)  Recent Labs    01/04/19 2342 01/05/19 0342 01/05/19 0712  GLUCAP 210* 207* 200*   CC time 33 minutes  Chesley Mires, MD [place] 01/05/2019, 9:39 AM

## 2019-01-05 NOTE — Care Management Note (Signed)
Case Management Note  Patient Details  Name: Nicholas Caldwell MRN: [id number] Date of Birth: [birth year]  Subjective/Objective:   Pt is a readmit with HF                 Action/Plan:   PTA from home.  Pt active with [facility] for [facility] and PT - agency informed of admit.  Pt is on home oxygen 3 liters.  CM notified today that pt has [facility] benefits;  CM left VM for [facility] transfer coordinator informing of admit.  Per HF team pt will be admitted into the para-medicine program at discharge   Expected Discharge Date:                  Expected Discharge Plan:  [place]  In-House Referral:     Discharge planning Services  CM Consult  Post Acute Care Choice:    Choice offered to:  Patient  DME Arranged:    DME Agency:     HH Arranged:  RN, PT [place] Agency:  [facility]  Status of Service:  In process, will continue to follow  If discussed at Long Length of Stay Meetings, dates discussed:    Additional Comments 01/05/2019 Pt transferred to ICU overnight and intubated.  CM received call back from [place] transfer coordinator - no documentation required at this time.  PCP is [name] with [facility].  CM contacted SW with [facility]  Abundio Miu [phone] to inform of admit  01/04/19 CM informed by [facility] that pt is active however he/his wife rarely allows them to complete [facility] visits.- [facility] will resume services post discharge.    CM discussed the importance of compliance with all treatment regimens, HF will also reinforce the need for compliance.   [name], RN 01/05/2019, 11:16 AM

## 2019-01-05 NOTE — Telephone Encounter (Signed)
Patient was referred to the HF Community Paramedicine program.  All appropriate paperwork has been sent via secure email to paramedic team.

## 2019-01-05 NOTE — Progress Notes (Signed)
eLink Physician-Brief Progress Note Patient Name: Nicholas Caldwell DOB: [birth year] MRN: [id number]   Date of Service  01/05/2019  HPI/Events of Note  Nursing requesting soft restraints to prevent self extubatin  eICU Interventions  Bilateral soft wrist restraints ordered        Frederik Pear 01/05/2019, 6:33 AM

## 2019-01-06 ENCOUNTER — Inpatient Hospital Stay (HOSPITAL_COMMUNITY): Payer: Medicare Other

## 2019-01-06 LAB — CBC
HCT: 30.2 % — ABNORMAL LOW (ref 39.0–52.0)
HEMOGLOBIN: 9 g/dL — AB (ref 13.0–17.0)
MCH: 26.2 pg (ref 26.0–34.0)
MCHC: 29.8 g/dL — ABNORMAL LOW (ref 30.0–36.0)
MCV: 87.8 fL (ref 80.0–100.0)
Platelets: 474 10*3/uL — ABNORMAL HIGH (ref 150–400)
RBC: 3.44 MIL/uL — ABNORMAL LOW (ref 4.22–5.81)
RDW: 18.6 % — ABNORMAL HIGH (ref 11.5–15.5)
WBC: 13.3 10*3/uL — ABNORMAL HIGH (ref 4.0–10.5)
nRBC: 0 % (ref 0.0–0.2)

## 2019-01-06 LAB — BLOOD GAS, ARTERIAL
Acid-Base Excess: 6.7 mmol/L — ABNORMAL HIGH (ref 0.0–2.0)
Bicarbonate: 31.4 mmol/L — ABNORMAL HIGH (ref 20.0–28.0)
Drawn by: 347621
FIO2: 0.4
LHR: 14 {breaths}/min
MECHVT: 620 mL
O2 Saturation: 95.5 %
PATIENT TEMPERATURE: 99
PEEP: 5 cmH2O
pCO2 arterial: 51.7 mmHg — ABNORMAL HIGH (ref 32.0–48.0)
pH, Arterial: 7.402 (ref 7.350–7.450)
pO2, Arterial: 83.5 mmHg (ref 83.0–108.0)

## 2019-01-06 LAB — GLUCOSE, CAPILLARY
Glucose-Capillary: 123 mg/dL — ABNORMAL HIGH (ref 70–99)
Glucose-Capillary: 124 mg/dL — ABNORMAL HIGH (ref 70–99)
Glucose-Capillary: 173 mg/dL — ABNORMAL HIGH (ref 70–99)
Glucose-Capillary: 181 mg/dL — ABNORMAL HIGH (ref 70–99)
Glucose-Capillary: 219 mg/dL — ABNORMAL HIGH (ref 70–99)
Glucose-Capillary: 221 mg/dL — ABNORMAL HIGH (ref 70–99)

## 2019-01-06 LAB — BASIC METABOLIC PANEL
ANION GAP: 11 (ref 5–15)
BUN: 38 mg/dL — ABNORMAL HIGH (ref 8–23)
CO2: 31 mmol/L (ref 22–32)
Calcium: 8.7 mg/dL — ABNORMAL LOW (ref 8.9–10.3)
Chloride: 98 mmol/L (ref 98–111)
Creatinine, Ser: 1.2 mg/dL (ref 0.61–1.24)
Glucose, Bld: 122 mg/dL — ABNORMAL HIGH (ref 70–99)
Potassium: 3.9 mmol/L (ref 3.5–5.1)
Sodium: 140 mmol/L (ref 135–145)

## 2019-01-06 LAB — CULTURE, RESPIRATORY W GRAM STAIN: Culture: NORMAL

## 2019-01-06 LAB — MAGNESIUM
Magnesium: 2.7 mg/dL — ABNORMAL HIGH (ref 1.7–2.4)
Magnesium: 2.3 mg/dL (ref 1.7–2.4)

## 2019-01-06 LAB — COOXEMETRY PANEL
Carboxyhemoglobin: 1.5 % (ref 0.5–1.5)
Methemoglobin: 1.7 % — ABNORMAL HIGH (ref 0.0–1.5)
O2 Saturation: 74.5 %
Total hemoglobin: 9.4 g/dL — ABNORMAL LOW (ref 12.0–16.0)

## 2019-01-06 LAB — PHOSPHORUS: Phosphorus: 3.8 mg/dL (ref 2.5–4.6)

## 2019-01-06 LAB — HEMOGLOBIN AND HEMATOCRIT, BLOOD
HCT: 30.3 % — ABNORMAL LOW (ref 39.0–52.0)
Hemoglobin: 8.8 g/dL — ABNORMAL LOW (ref 13.0–17.0)

## 2019-01-06 LAB — APTT: aPTT: 83 seconds — ABNORMAL HIGH (ref 24–36)

## 2019-01-06 LAB — HEPARIN LEVEL (UNFRACTIONATED): Heparin Unfractionated: 0.63 IU/mL (ref 0.30–0.70)

## 2019-01-06 MED ORDER — ACETAMINOPHEN 160 MG/5ML PO SOLN
650.0000 mg | Freq: Four times a day (QID) | ORAL | Status: DC | PRN
  Administered 2019-01-06 (×2): 650 mg via ORAL
  Filled 2019-01-06 (×2): qty 20.3

## 2019-01-06 MED ORDER — SODIUM CHLORIDE 0.9 % IV SOLN
2.0000 g | Freq: Three times a day (TID) | INTRAVENOUS | Status: AC
  Administered 2019-01-06 – 2019-01-12 (×21): 2 g via INTRAVENOUS
  Filled 2019-01-06 (×22): qty 2

## 2019-01-06 MED ORDER — HEPARIN (PORCINE) 25000 UT/250ML-% IV SOLN
1100.0000 [IU]/h | INTRAVENOUS | Status: DC
  Administered 2019-01-06: 1500 [IU]/h via INTRAVENOUS
  Administered 2019-01-07: 1400 [IU]/h via INTRAVENOUS
  Administered 2019-01-08 – 2019-01-09 (×2): 1200 [IU]/h via INTRAVENOUS
  Filled 2019-01-06 (×4): qty 250

## 2019-01-06 MED ORDER — CLONIDINE HCL 0.3 MG PO TABS
0.3000 mg | ORAL_TABLET | Freq: Four times a day (QID) | ORAL | Status: DC
  Administered 2019-01-06 – 2019-01-07 (×4): 0.3 mg
  Filled 2019-01-06 (×5): qty 1

## 2019-01-06 MED ORDER — PRO-STAT SUGAR FREE PO LIQD
30.0000 mL | Freq: Two times a day (BID) | ORAL | Status: DC
  Administered 2019-01-06: 30 mL
  Filled 2019-01-06: qty 30

## 2019-01-06 MED ORDER — HYDRALAZINE HCL 25 MG PO TABS
25.0000 mg | ORAL_TABLET | Freq: Three times a day (TID) | ORAL | Status: DC
  Administered 2019-01-06 – 2019-01-12 (×18): 25 mg
  Filled 2019-01-06 (×19): qty 1

## 2019-01-06 MED ORDER — VITAL HIGH PROTEIN PO LIQD
1000.0000 mL | ORAL | Status: DC
  Administered 2019-01-06 – 2019-01-10 (×6): 1000 mL

## 2019-01-06 MED ORDER — PANTOPRAZOLE SODIUM 40 MG PO PACK
40.0000 mg | PACK | Freq: Two times a day (BID) | ORAL | Status: DC
  Administered 2019-01-06 – 2019-01-11 (×10): 40 mg
  Filled 2019-01-06 (×13): qty 20

## 2019-01-06 MED ORDER — PRO-STAT SUGAR FREE PO LIQD
30.0000 mL | Freq: Four times a day (QID) | ORAL | Status: DC
  Administered 2019-01-06 – 2019-01-10 (×19): 30 mL
  Filled 2019-01-06 (×19): qty 30

## 2019-01-06 MED ORDER — VITAL HIGH PROTEIN PO LIQD
1000.0000 mL | ORAL | Status: DC
  Administered 2019-01-06: 1000 mL

## 2019-01-06 MED ORDER — ISOSORBIDE DINITRATE 10 MG PO TABS
10.0000 mg | ORAL_TABLET | Freq: Three times a day (TID) | ORAL | Status: DC
  Administered 2019-01-06 – 2019-01-12 (×19): 10 mg
  Filled 2019-01-06 (×19): qty 1

## 2019-01-06 NOTE — Progress Notes (Addendum)
[place] for heparin Indication: atrial fibrillation  No Known Allergies  Patient Measurements: Height: 6' (182.9 cm) Weight: 259 lb 4.2 oz (117.6 kg) IBW/kg (Calculated) : 77.6 Heparin Dosing Weight: 107 kg  Vital Signs: Temp: 101.5 F (38.6 C) (03/05 0723) Temp Source: Oral (03/05 0723) BP: 149/67 (03/05 0726) Pulse Rate: 75 (03/05 0726)  Labs: Recent Labs    01/04/19 0240  01/04/19 1950 01/05/19 0414 01/05/19 0430  01/05/19 1822 01/06/19 0153 01/06/19 0456  HGB 8.2*   < > 8.5* 8.9*  --    < > 8.8* 8.8* 9.0*  HCT 28.5*   < > 29.9* 30.8*  --    < > 30.1* 30.3* 30.2*  PLT 424*  --  430* 465*  --   --   --   --  474*  APTT  --   --  31  --  72*  --   --   --   --   HEPARINUNFRC  --   --  1.48*  --  0.93*  --   --   --   --   CREATININE 1.51*  --   --   --  1.36*  --   --   --  1.20   < > = values in this interval not displayed.    Estimated Creatinine Clearance: 75.8 mL/min (by C-G formula based on SCr of 1.2 mg/dL).   Medical History: Past Medical History:  Diagnosis Date  . Atrial flutter ([place])    a. recurrent AFlutter with RVR;  b. Amiodarone Rx started 4/16  . CAD (coronary artery disease)    a. LHC 1/16:  mLAD diffuse disease, pLCx mild disease, dLCx with disease but too small for PCI, RCA ok, EF 25-30%  . Chronic pain   . Chronic systolic CHF (congestive heart failure) ([place])   . COPD (chronic obstructive pulmonary disease) ([place])   . Diabetes mellitus without complication ([place])   . Hypercholesteremia   . Hypertension   . NICM (nonischemic cardiomyopathy) ([place])    a.dx 2016. b. 2D echo 06/2016 - Last echo 07/01/16: mod dilated LV, mod LVH, EF 25-30%, mild-mod MR, sev LAE, mild-mod reduced RV systolic function, mild-mod TR, PASP 43mHG.  .Marland KitchenPAF (paroxysmal atrial fibrillation) (HCC)    On amio - ot a candidate for flecainide due to cardiomyopathy, not a candidate for Tikosyn due to prolonged QT, and felt to be a poor  candidate for ablation given left atrial size.  . [place]monary hypertension ([place]   . Tobacco abuse     Medications:  Scheduled:  . chlorhexidine gluconate (MEDLINE KIT)  15 mL Mouth Rinse BID  . feeding supplement (PRO-STAT SUGAR FREE 64)  30 mL Per Tube BID  . feeding supplement (VITAL HIGH PROTEIN)  1,000 mL Per Tube Q24H  . furosemide  40 mg Intravenous Q12H  . hydrALAZINE  25 mg Per Tube Q8H  . insulin aspart  3-9 Units Subcutaneous Q4H  . ipratropium-albuterol  3 mL Nebulization Q6H  . isosorbide dinitrate  10 mg Per Tube TID  . latanoprost  1 drop Both Eyes QHS  . mouth rinse  15 mL Mouth Rinse 10 times per day  . methylPREDNISolone (SOLU-MEDROL) injection  20 mg Intravenous Q12H  . pantoprazole sodium  40 mg Per Tube BID    Assessment: 89yom admitted with respiratory and systolic heart failure, on Xarelto PTA for Afib (last dose on 3/2_0 ). Presented with AMS with inability to protect  his airway - now intubated and transferred to 63M.   Was transitioned to heparin gtt on 3/3 but held on 3/4 due to concerns for GI bleed. No bleeding reported per RN, Hgb trending up to 9. Restarting heparin gtt per MD at this time. Was recently therapeutic at 1500 units/hr. SCR trending down to 1.2  Will monitor both aPTT and heparin level until correlate given recent Xarelto administration.   Goal of Therapy:  Heparin level 0.3-0.5 units/ml - will aim for a lower goal due to inc risk of bleed aPTT 66-102 seconds Monitor platelets by anticoagulation protocol: Yes   Plan:  Restart heparin infusion at 1500 units/hr No bolus given recent concern for bleed Check anti-Xa and aPTT level in 8 hours and daily while on heparin Continue to monitor H&H and platelets, and s/sx of bleeding  Juanell Fairly, PharmD PGY1 Pharmacy Resident Phone [phone] 01/06/2019 9:38 AM

## 2019-01-06 NOTE — Progress Notes (Addendum)
Advanced Heart Failure Rounding Note  PCP-Cardiologist: No primary care provider on file.   Subjective:   Remains intubated. Febrile this morning. Started on antibiotics.   Follows commands on vet.  Objective:   Weight Range: 117.6 kg Body mass index is 35.16 kg/m.   Vital Signs:   Temp:  [98.2 F (36.8 C)-101.5 F (38.6 C)] 101.5 F (38.6 C) (03/05 0723) Pulse Rate:  [55-77] 75 (03/05 0726) Resp:  [13-20] 13 (03/05 0726) BP: (123-161)/(56-80) 149/67 (03/05 0726) SpO2:  [95 %-100 %] 100 % (03/05 0726) FiO2 (%):  [40 %] 40 % (03/05 0835) Weight:  [117.6 kg] 117.6 kg (03/05 0421) Last BM Date: 01/01/19  Weight change: Filed Weights   01/04/19 0300 01/05/19 0154 01/06/19 0421  Weight: 130.5 kg 121.3 kg 117.6 kg    Intake/Output:   Intake/Output Summary (Last 24 hours) at 01/06/2019 0956 Last data filed at 01/06/2019 0700 Gross per 24 hour  Intake 1372.58 ml  Output 3010 ml  Net -1637.42 ml      Physical Exam   CVP 7-8 General:  Intubated  HEENT: ETT Neck: supple. JVP 7-8 . Carotids 2+ bilat; no bruits. No lymphadenopathy or thryomegaly appreciated. Cor: PMI nondisplaced. Regular rate & rhythm. No rubs, gallops or murmurs. Lungs: Rhonchi throughout Abdomen: soft, nontender,distended. No hepatosplenomegaly. No bruits or masses. Good bowel sounds. Extremities: no cyanosis, clubbing, rash, edema Neuro: on vent. Follows commands.   Telemetry   SR PACs EKG    N/A   Labs    CBC Recent Labs    01/05/19 0414  01/06/19 0153 01/06/19 0456  WBC 8.2  --   --  13.3*  HGB 8.9*   < > 8.8* 9.0*  HCT 30.8*   < > 30.3* 30.2*  MCV 86.8  --   --  87.8  PLT 465*  --   --  474*   < > = values in this interval not displayed.   Basic Metabolic Panel Recent Labs    01/05/19 0430 01/06/19 0456  NA 139 140  K 3.8 3.9  CL 96* 98  CO2 31 31  GLUCOSE 207* 122*  BUN 32* 38*  CREATININE 1.36* 1.20  CALCIUM 8.8* 8.7*  MG 2.7* 2.7*  PHOS 2.9  --    Liver  Function Tests Recent Labs    01/04/19 0240 01/05/19 0430  AST 15 24  ALT 12 15  ALKPHOS 102 112  BILITOT 0.3 0.6  PROT 6.9 7.1  ALBUMIN 3.1* 3.1*   No results for input(s): LIPASE, AMYLASE in the last 72 hours. Cardiac Enzymes No results for input(s): CKTOTAL, CKMB, CKMBINDEX, TROPONINI in the last 72 hours.  BNP: BNP (last 3 results) Recent Labs    11/21/18 2238 12/01/18 0015 12/30/18 1621  BNP 1,739.7* 1,666.7* 1,273.0*    ProBNP (last 3 results) No results for input(s): PROBNP in the last 8760 hours.   D-Dimer No results for input(s): DDIMER in the last 72 hours. Hemoglobin A1C No results for input(s): HGBA1C in the last 72 hours. Fasting Lipid Panel No results for input(s): CHOL, HDL, LDLCALC, TRIG, CHOLHDL, LDLDIRECT in the last 72 hours. Thyroid Function Tests No results for input(s): TSH, T4TOTAL, T3FREE, THYROIDAB in the last 72 hours.  Invalid input(s): FREET3  Other results:   Imaging    Dg Chest Port 1 View  Result Date: 01/06/2019 CLINICAL DATA:  Respiratory failure EXAM: PORTABLE CHEST 1 VIEW COMPARISON:  Yesterday FINDINGS: The internal and external portions of the endotracheal tube  overlap. Based on tubing, the internal tip is at the level of the clavicular heads, stable. The orogastric tube at least reaches the stomach. Unremarkable right IJ line. Haziness of the bilateral lower chest, stable and characterized by CT 3 days ago. Cardiomegaly. IMPRESSION: 1. Stable hardware positioning. 2. Unchanged lung opacity and layering pleural fluid. Electronically Signed   By: Monte Fantasia M.D.   On: 01/06/2019 08:01     Medications:     Scheduled Medications: . chlorhexidine gluconate (MEDLINE KIT)  15 mL Mouth Rinse BID  . feeding supplement (PRO-STAT SUGAR FREE 64)  30 mL Per Tube BID  . feeding supplement (VITAL HIGH PROTEIN)  1,000 mL Per Tube Q24H  . furosemide  40 mg Intravenous Q12H  . hydrALAZINE  25 mg Per Tube Q8H  . insulin aspart  3-9  Units Subcutaneous Q4H  . ipratropium-albuterol  3 mL Nebulization Q6H  . isosorbide dinitrate  10 mg Per Tube TID  . latanoprost  1 drop Both Eyes QHS  . mouth rinse  15 mL Mouth Rinse 10 times per day  . methylPREDNISolone (SOLU-MEDROL) injection  20 mg Intravenous Q12H  . pantoprazole sodium  40 mg Per Tube BID    Infusions: . sodium chloride 10 mL/hr at 01/04/19 1700  . cefTAZidime (FORTAZ)  IV 2 g (01/06/19 0950)  . dexmedetomidine (PRECEDEX) IV infusion 0.9 mcg/kg/hr (01/06/19 0918)  . fentaNYL infusion INTRAVENOUS 400 mcg/hr (01/06/19 0919)  . heparin      PRN Medications: acetaminophen (TYLENOL) oral liquid 160 mg/5 mL, albuterol, fentaNYL (SUBLIMAZE) injection, labetalol    Patient Profile   Nicholas Caldwell a 73 y.o.malePMH of chronic systolic CHF/nonischemic cardiomyopathy Echo 12/13/2016 LVEF 40-45%, COPD on home oxygen, PAF and history of non compliance.   Admitted with A/C respiratory failure and A/C systolic heart failure.  Assessment/Plan  1. A/C  Hypoxic/Hypercapnic Respiratory Failure Intubated 01/04/19 .  On steroids and started on fortaz today.     2. A/C Systolic Heart Failure  ECHO Ef 40-45%  CO-OX stable.  CVP ~7-8.  Continue IV lasix.  Renal function stable.  --CO2---> 41>40>31   3. PAF Maintaining NSR. -Off xarelto/heprain with coffee ground emesis.  Not tikosyn or amiodarone due to Qt prolongation.   4. Anemia  -H/O GI bleed and received PRBCs 2/8 -Hgb 9. Stable.  Had coffee ground emesis in OF tube. Started on protonix   5. Alkalosis: Suspect compensation for respiratory acidosis.   Had 2 days of diamox. CO2 31  Length of Stay: 7  [name], NP  01/06/2019, 9:56 AM  Advanced Heart Failure Team Pager [phone] (M-F; 7a - 4p)  Please contact [facility] for night-coverage after hours (4p -7a ) and weekends on [email or web address]  Patient seen with NP, agree with the above note.   Intubated for hypercarbic respiratory failure.    Continues to diurese well, CVP now 7.  Co-ox 75%.  Creatinine stable. Coffee grounds emesis resolved, stable hgb.   On exam, intubated, JVP not elevated, regular S1S2, decreased BS at bases, no edema.   1. Acute/chronic systolic CHF: Nonischemic cardiomyopathy based on cath in 2/20. Echo in 1/20 with EF 40-45%. Severe COPD/OHS/OSA on home oxygen is a confounder.He continues to diurese on IV Lasix. Co-ox 75% with CVP 7.   -ContinueLasix 40 mg IV bid for now, keep I = O to mildly negative while intubated..  - Continue hydralazine 25 mg tid with isordil 10 tid while intubated.  -Can add back spironolactone 12.5 daily.  -  Held bisoprolol with volume overload and wheezing, can likely start back soon. - Think he would be a good Cardiomems candidate eventually. 2. Atrial fibrillation: Paroxysmal. He is in NSR today.  On Xarelto at home.  - Has not been felt to be a candidate for Tikosyn and amiodarone due to QT prolongation and severe lung disease.  - Heparin gtt restarted given no further coffee grounds. 3. Pulmonary: Has COPD, wears home oxygen at baseline. Suspect co-existing COPD exacerbation with diffuse wheezing. Today, WBCs went up to 13 with Tm 101.5.  Now treating with ceftazidime for HCAP.  - Continue nebs and steroids per CCM.  - On ceftazidime for HCAP.  4. Anemia: H/o GI bleeding.  Hemoglobin stable in setting of coffee grounds emesis, now resolved.    - Continue PPI. - heparin gtt restarted.   5. OHS/OSA: Hypercarbic respiratory failure in setting of OHS/OSA and COPD.  OSA diagnosed on sleep study, but having a lot of trouble getting CPAP from the [place].  Will need to see if we can get outside of [place].  - Vent management per CCM, will need CPAP long-term.   Loralie Champagne 01/06/2019 1:54 PM

## 2019-01-06 NOTE — Progress Notes (Signed)
ANTICOAGULATION CONSULT NOTE   Pharmacy Consult for heparin Indication: atrial fibrillation  No Known Allergies  Patient Measurements: Height: 6' (182.9 cm) Weight: 259 lb 4.2 oz (117.6 kg) IBW/kg (Calculated) : 77.6 Heparin Dosing Weight: 107 kg  Vital Signs: Temp: 101.2 F (38.4 C) (03/05 1800) Temp Source: Oral (03/05 1800) BP: 147/60 (03/05 1800) Pulse Rate: 72 (03/05 1800)  Labs: Recent Labs    01/04/19 0240  01/04/19 1950 01/05/19 0414 01/05/19 0430  01/05/19 1822 01/06/19 0153 01/06/19 0456  HGB 8.2*   < > 8.5* 8.9*  --    < > 8.8* 8.8* 9.0*  HCT 28.5*   < > 29.9* 30.8*  --    < > 30.1* 30.3* 30.2*  PLT 424*  --  430* 465*  --   --   --   --  474*  APTT  --   --  31  --  72*  --   --   --   --   HEPARINUNFRC  --   --  1.48*  --  0.93*  --   --   --   --   CREATININE 1.51*  --   --   --  1.36*  --   --   --  1.20   < > = values in this interval not displayed.    Estimated Creatinine Clearance: 75.8 mL/min (by C-G formula based on SCr of 1.2 mg/dL).   Medical History: Past Medical History:  Diagnosis Date  . Atrial flutter ([place])    a. recurrent AFlutter with RVR;  b. Amiodarone Rx started 4/16  . CAD (coronary artery disease)    a. LHC 1/16:  mLAD diffuse disease, pLCx mild disease, dLCx with disease but too small for PCI, RCA ok, EF 25-30%  . Chronic pain   . Chronic systolic CHF (congestive heart failure) ([place])   . COPD (chronic obstructive pulmonary disease) ([place])   . Diabetes mellitus without complication ([place])   . Hypercholesteremia   . Hypertension   . NICM (nonischemic cardiomyopathy) ([place])    a.dx 2016. b. 2D echo 06/2016 - Last echo 07/01/16: mod dilated LV, mod LVH, EF 25-30%, mild-mod MR, sev LAE, mild-mod reduced RV systolic function, mild-mod TR, PASP 69mHG.  .Marland KitchenPAF (paroxysmal atrial fibrillation) (H[facility])    On amio - ot a candidate for flecainide due to cardiomyopathy, not a candidate for Tikosyn due to prolonged QT, and felt to be a poor  candidate for ablation given left atrial size.  . Pulmonary hypertension ([place]   . Tobacco abuse     Medications:  Scheduled:  . chlorhexidine gluconate (MEDLINE KIT)  15 mL Mouth Rinse BID  . cloNIDine  0.3 mg Per Tube Q6H  . feeding supplement (PRO-STAT SUGAR FREE 64)  30 mL Per Tube QID  . feeding supplement (VITAL HIGH PROTEIN)  1,000 mL Per Tube Q24H  . furosemide  40 mg Intravenous Q12H  . hydrALAZINE  25 mg Per Tube Q8H  . insulin aspart  3-9 Units Subcutaneous Q4H  . ipratropium-albuterol  3 mL Nebulization Q6H  . isosorbide dinitrate  10 mg Per Tube TID  . latanoprost  1 drop Both Eyes QHS  . mouth rinse  15 mL Mouth Rinse 10 times per day  . methylPREDNISolone (SOLU-MEDROL) injection  20 mg Intravenous Q12H  . pantoprazole sodium  40 mg Per Tube BID    Assessment: 89yom admitted with respiratory and systolic heart failure, on Xarelto PTA for Afib (last dose  on 3/2_0 ). Presented with AMS with inability to protect his airway - now intubated and transferred to 22M.   Was transitioned to heparin gtt on 3/3 but held on 3/4 due to concerns for GI bleed. No bleeding reported per RN, Hgb trending up to 9. Restarting heparin gtt per MD at this time. Was recently therapeutic at 1500 units/hr. SCR trending down to 1.2  Will monitor both aPTT and heparin level until correlate given recent Xarelto administration.   Last heparin level coming down to 0.63 and aPTT therapeutic at 83. Most likely will be correlating tomorrow morning.  Goal of Therapy:  Heparin level 0.3-0.5 units/ml - will aim for a lower goal due to inc risk of bleed aPTT 66-102 seconds Monitor platelets by anticoagulation protocol: Yes   Plan:  Continue heparin infusion at 1,500 units/hr Check anti-Xa and aPTT daily Continue to monitor H&H and platelets, and s/sx of bleeding  Elenor Quinones, PharmD, BCPS, [facility] Clinical Pharmacist Phone number [phone] 01/06/2019 6:41 PM

## 2019-01-06 NOTE — Progress Notes (Signed)
Initial Nutrition Assessment  DOCUMENTATION CODES:   Obesity unspecified  INTERVENTION:    Vital High Protein at 50 ml/h (1200 ml per day)  Pro-stat 30 ml QID  Provides 1600 kcal, 165 gm protein, 1003 ml free water daily  NUTRITION DIAGNOSIS:   Inadequate oral intake related to inability to eat as evidenced by NPO status.  GOAL:   Provide needs based on ASPEN/SCCM guidelines  MONITOR:   Vent status, Labs, TF tolerance, Skin  REASON FOR ASSESSMENT:   Ventilator, Consult Enteral/tube feeding initiation and management  ASSESSMENT:   73 yo male with PMH of HTN, DM, HLD, CHF, NICM, CAD, COPD, and tobacco abuse who was admitted on 2/27 with decompensated heart failure. Developed respiratory distress and required intubation and transfer to the ICU on 3/3.   Received MD Consult for TF initiation and management. OGT in place.  Patient is currently intubated on ventilator support MV: 8.5 L/min Temp (24hrs), Avg:99.5 F (37.5 C), Min:98.2 F (36.8 C), Max:101.5 F (38.6 C)    Labs reviewed. Magnesium 2.7 (H) CBG's: 123-124 Medications reviewed and include Lasix, Novolog, Solumedrol.   NUTRITION - FOCUSED PHYSICAL EXAM:    Most Recent Value  Orbital Region  No depletion  Upper Arm Region  No depletion  Thoracic and Lumbar Region  No depletion  Buccal Region  Unable to assess  Temple Region  Mild depletion  Clavicle Bone Region  No depletion  Clavicle and Acromion Bone Region  No depletion  Scapular Bone Region  Unable to assess  Dorsal Hand  No depletion  Patellar Region  No depletion  Anterior Thigh Region  No depletion  Posterior Calf Region  No depletion  Edema (RD Assessment)  None  Hair  Reviewed  Eyes  Reviewed  Mouth  Unable to assess  Skin  Reviewed  Nails  Reviewed       Diet Order:   Diet Order            Diet NPO time specified  Diet effective now              EDUCATION NEEDS:   No education needs have been identified at this  time  Skin:  Skin Assessment: Reviewed RN Assessment(MASD to groin, perineum)  Last BM:  2/29  Height:   Ht Readings from Last 1 Encounters:  01/04/19 6' (1.829 m)    Weight:   Wt Readings from Last 1 Encounters:  01/06/19 117.6 kg    Ideal Body Weight:  80.9 kg  BMI:  Body mass index is 35.16 kg/m.  Estimated Nutritional Needs:   Kcal:  0932-3557  Protein:  162 gm  Fluid:  2 L    Molli Barrows, RD, LDN, [place] Pager [phone] After Hours Pager [phone]

## 2019-01-06 NOTE — Progress Notes (Signed)
Pharmacy Antibiotic Note  Nicholas Caldwell is a 73 y.o. male admitted to ICU on 3/3 requiring intubation.  Pharmacy has been consulted for ceftazidime dosing for HCAP.  Febrile this AM 101.5, WBC 13.3, SCR trending down to 1.2  Plan: Ceftazidime 2g IV Q8hrs  Monitor clinical s/sx improvement, sputum culture, renal function  Height: 6' (182.9 cm) Weight: 259 lb 4.2 oz (117.6 kg) IBW/kg (Calculated) : 77.6  Temp (24hrs), Avg:99.2 F (37.3 C), Min:98.2 F (36.8 C), Max:101.5 F (38.6 C)  Recent Labs  Lab 01/02/19 0239  01/03/19 0301 01/04/19 0240 01/04/19 1950 01/05/19 0414 01/05/19 0430 01/06/19 0456  WBC  --    < > 9.6 10.3 9.2 8.2  --  13.3*  CREATININE 1.23  --  1.35* 1.51*  --   --  1.36* 1.20   < > = values in this interval not displayed.    Estimated Creatinine Clearance: 75.8 mL/min (by C-G formula based on SCr of 1.2 mg/dL).    No Known Allergies  Antimicrobials this admission: 3/5 ceftazidime >>    Microbiology results: 3/3 Sputum:   2/27 MRSA PCR: negative  Thank you for allowing pharmacy to be a part of this patient's care.  Juanell Fairly, PharmD PGY1 Pharmacy Resident Phone [phone] 01/06/2019 9:18 AM

## 2019-01-06 NOTE — Progress Notes (Signed)
NAME:  Nicholas Caldwell, MRN:  [id number], DOB:  03-02-1946, LOS: 7 ADMISSION DATE:  12/30/2018, CONSULTATION DATE:  3/3 REFERRING MD:  Florene Glen, CHIEF COMPLAINT:  Acute hypoxic and hypercarbic respiratory failure    Brief History   73 yo male presented with volume overload from acute on chronic biventricular CHF.  Developed progressive hypoxia, hypercapnia, and altered mental status.  Transferred to ICU 3/03 and intubated.  Third hospitalization since January 2020.  Past Medical History  Nonischemic cardiomyopathy, with left ventricular ejection fraction of 40 to 45%, chronic respiratory failure on nasal cannula at baseline,  Paroxysmal atrial fibrillation, sleep apnea, hypertension, prior noncompliance, HLD, s/p splenectomy  Significant Hospital Events   2/27: Admitted with decompensated heart failure 3/03: Critical care consulted, patient obtunded, more hypercarbic, worsening respiratory failure. 3/05: resume heparin gtt, fever, start ABx for HCAP  Consults:  Heart failure team 3/2  Procedures:  Endotracheal tube 3/3 >>   Significant Diagnostic Tests:  LHC/RHC 12/06/18 >> nonobstructive CAD, mod pulmonary HTN, PVR not significantly elevated CT chest 01/03/19 >> small/mod Rt and small Lt effusion, compressive ATX, CM  Micro Data:  Respiratory culture 3/3 >>   Antimicrobials:  Tressie Ellis 3/05 >>  Interim history/subjective:  No evidence of bleeding.  Fever 101.5.  Didn't tolerate pressure support.  Objective   Blood pressure (!) 149/67, pulse 75, temperature (!) 101.5 F (38.6 C), temperature source Oral, resp. rate 13, height 6' (1.829 m), weight 117.6 kg, SpO2 100 %. CVP:  [9 mmHg-17 mmHg] 11 mmHg  Vent Mode: PRVC FiO2 (%):  [40 %] 40 % Set Rate:  [14 bmp] 14 bmp Vt Set:  [320 mL-620 mL] 620 mL PEEP:  [5 cmH20] 5 cmH20 Pressure Support:  [15 cmH20] 15 cmH20 Plateau Pressure:  [24 cmH20-25 cmH20] 25 cmH20   Intake/Output Summary (Last 24 hours) at 01/06/2019 0856 Last data filed  at 01/06/2019 0600 Gross per 24 hour  Intake 1362.58 ml  Output 3110 ml  Net -1747.42 ml   Filed Weights   01/04/19 0300 01/05/19 0154 01/06/19 0421  Weight: 130.5 kg 121.3 kg 117.6 kg    Examination:  General - alert Eyes - pupils reactive ENT - ETT in place Cardiac - regular rate/rhythm, no murmur Chest - decreased BS at bases Abdomen - soft, non tender, + bowel sounds Extremities - no cyanosis, clubbing, or edema Skin - no rashes Neuro - RASS 0  CXR (reviewed by me) - Lt > Rt ASD   Resolved Hospital Problem list     Assessment & Plan:   Acute on chronic hypoxic/hypercapnic respiratory failure. OSA/OHS. COPD. Pleural effusions with atelectasis. Plan - pressure support as able - f/u CXR  - scheduled BDs - continue solumedrol 20 mg q12h  Fever with concern for HCAP. Plan - f/u sputum culture - add fortaz 3/05  Acute on chronic combined CHF with cor pulmonale. Paroxysmal A fib on xarelto as outpt. HTN, HLD. Prolonged QTc. Plan - lasix 40 mg bid - continue hydralazine, isordil - prn labetalol for SBP > 160, DBP > 105 - no further evidence for GI bleeding >> resume heparin gtt 3/05 - heart failure team following  Acute metabolic encephalopathy 2nd to respiratory failure. Plan - RASS goal 0 to -1  Gastritis. Plan - continue protonix BID - add tube feeds  DM type II. Plan - SSI  Anemia of critical illness and chronic disease. Plan - f/u CBC - transfuse for Hb < 7   Best practice:  Diet: tube feeds DVT prophylaxis:  heparin gtt GI prophylaxis: Protonix Mobility: Bedrest Code Status: Full code Family Communication: No family at bedside Disposition: ICU  Labs    CMP Latest Ref Rng & Units 01/06/2019 01/05/2019 01/04/2019  Glucose 70 - 99 mg/dL 122(H) 207(H) -  BUN 8 - 23 mg/dL 38(H) 32(H) -  Creatinine 0.61 - 1.24 mg/dL 1.20 1.36(H) -  Sodium 135 - 145 mmol/L 140 139 139  Potassium 3.5 - 5.1 mmol/L 3.9 3.8 3.4(L)  Chloride 98 - 111 mmol/L 98  96(L) -  CO2 22 - 32 mmol/L 31 31 -  Calcium 8.9 - 10.3 mg/dL 8.7(L) 8.8(L) -  Total Protein 6.5 - 8.1 g/dL - 7.1 -  Total Bilirubin 0.3 - 1.2 mg/dL - 0.6 -  Alkaline Phos 38 - 126 U/L - 112 -  AST 15 - 41 U/L - 24 -  ALT 0 - 44 U/L - 15 -   CBC Latest Ref Rng & Units 01/06/2019 01/06/2019 01/05/2019  WBC 4.0 - 10.5 K/uL 13.3(H) - -  Hemoglobin 13.0 - 17.0 g/dL 9.0(L) 8.8(L) 8.8(L)  Hematocrit 39.0 - 52.0 % 30.2(L) 30.3(L) 30.1(L)  Platelets 150 - 400 K/uL 474(H) - -   ABG    Component Value Date/Time   PHART 7.402 01/06/2019 0300   PCO2ART 51.7 (H) 01/06/2019 0300   PO2ART 83.5 01/06/2019 0300   HCO3 31.4 (H) 01/06/2019 0300   TCO2 47 (H) 01/04/2019 1710   ACIDBASEDEF 2.0 02/19/2015 1003   O2SAT 74.5 01/06/2019 0500   CBG (last 3)  Recent Labs    01/05/19 2346 01/06/19 0309 01/06/19 0717  GLUCAP 135* 123* 124*   CC time 31 minutes  Chesley Mires, MD [place] 01/06/2019, 8:56 AM

## 2019-01-07 ENCOUNTER — Inpatient Hospital Stay (HOSPITAL_COMMUNITY): Payer: Medicare Other

## 2019-01-07 DIAGNOSIS — I361 Nonrheumatic tricuspid (valve) insufficiency: Secondary | ICD-10-CM

## 2019-01-07 LAB — BASIC METABOLIC PANEL
ANION GAP: 12 (ref 5–15)
Anion gap: 10 (ref 5–15)
BUN: 44 mg/dL — ABNORMAL HIGH (ref 8–23)
BUN: 58 mg/dL — ABNORMAL HIGH (ref 8–23)
CO2: 30 mmol/L (ref 22–32)
CO2: 31 mmol/L (ref 22–32)
Calcium: 8.7 mg/dL — ABNORMAL LOW (ref 8.9–10.3)
Calcium: 8.5 mg/dL — ABNORMAL LOW (ref 8.9–10.3)
Chloride: 100 mmol/L (ref 98–111)
Chloride: 101 mmol/L (ref 98–111)
Creatinine, Ser: 1.02 mg/dL (ref 0.61–1.24)
Creatinine, Ser: 1.2 mg/dL (ref 0.61–1.24)
GFR calc Af Amer: >60 mL/min (ref >60)
GFR calc Af Amer: >60 mL/min (ref >60)
GFR calc non Af Amer: >60 mL/min (ref >60)
GFR calc non Af Amer: >60 mL/min (ref >60)
Glucose, Bld: 137 mg/dL — ABNORMAL HIGH (ref 70–99)
Glucose, Bld: 245 mg/dL — ABNORMAL HIGH (ref 70–99)
Potassium: 3.6 mmol/L (ref 3.5–5.1)
Potassium: 3.7 mmol/L (ref 3.5–5.1)
Sodium: 140 mmol/L (ref 135–145)
Sodium: 144 mmol/L (ref 135–145)

## 2019-01-07 LAB — COOXEMETRY PANEL
CARBOXYHEMOGLOBIN: 1.6 % — AB (ref 0.5–1.5)
Methemoglobin: 1.7 % — ABNORMAL HIGH (ref 0.0–1.5)
O2 Saturation: 69.7 %
Total hemoglobin: 9.8 g/dL — ABNORMAL LOW (ref 12.0–16.0)

## 2019-01-07 LAB — GLUCOSE, CAPILLARY
GLUCOSE-CAPILLARY: 214 mg/dL — AB (ref 70–99)
Glucose-Capillary: 153 mg/dL — ABNORMAL HIGH (ref 70–99)
Glucose-Capillary: 162 mg/dL — ABNORMAL HIGH (ref 70–99)
Glucose-Capillary: 203 mg/dL — ABNORMAL HIGH (ref 70–99)
Glucose-Capillary: 206 mg/dL — ABNORMAL HIGH (ref 70–99)
Glucose-Capillary: 210 mg/dL — ABNORMAL HIGH (ref 70–99)

## 2019-01-07 LAB — CBC
HCT: 31.4 % — ABNORMAL LOW (ref 39.0–52.0)
Hemoglobin: 9.2 g/dL — ABNORMAL LOW (ref 13.0–17.0)
MCH: 25.4 pg — ABNORMAL LOW (ref 26.0–34.0)
MCHC: 29.3 g/dL — ABNORMAL LOW (ref 30.0–36.0)
MCV: 86.7 fL (ref 80.0–100.0)
Platelets: 457 10*3/uL — ABNORMAL HIGH (ref 150–400)
RBC: 3.62 MIL/uL — ABNORMAL LOW (ref 4.22–5.81)
RDW: 18 % — ABNORMAL HIGH (ref 11.5–15.5)
WBC: 14 10*3/uL — ABNORMAL HIGH (ref 4.0–10.5)
nRBC: 0 % (ref 0.0–0.2)

## 2019-01-07 LAB — ECHOCARDIOGRAM COMPLETE
Height: 72 in
Weight: 4063.52 oz

## 2019-01-07 LAB — HEPARIN LEVEL (UNFRACTIONATED)
Heparin Unfractionated: 0.89 IU/mL — ABNORMAL HIGH (ref 0.30–0.70)
Heparin Unfractionated: 0.82 IU/mL — ABNORMAL HIGH (ref 0.30–0.70)
Heparin Unfractionated: 0.59 IU/mL (ref 0.30–0.70)

## 2019-01-07 LAB — APTT
aPTT: 88 seconds — ABNORMAL HIGH (ref 24–36)
aPTT: 67 seconds — ABNORMAL HIGH (ref 24–36)

## 2019-01-07 LAB — PHOSPHORUS
Phosphorus: 4 mg/dL (ref 2.5–4.6)
Phosphorus: 3.4 mg/dL (ref 2.5–4.6)

## 2019-01-07 LAB — MAGNESIUM
Magnesium: 2.3 mg/dL (ref 1.7–2.4)
Magnesium: 2.3 mg/dL (ref 1.7–2.4)

## 2019-01-07 MED ORDER — PREDNISONE 5 MG/5ML PO SOLN
20.0000 mg | Freq: Every day | ORAL | Status: DC
  Administered 2019-01-07 – 2019-01-09 (×3): 20 mg
  Filled 2019-01-07 (×3): qty 20

## 2019-01-07 MED ORDER — IPRATROPIUM-ALBUTEROL 0.5-2.5 (3) MG/3ML IN SOLN
3.0000 mL | RESPIRATORY_TRACT | Status: DC | PRN
  Administered 2019-01-08: 3 mL via RESPIRATORY_TRACT

## 2019-01-07 MED ORDER — FUROSEMIDE 10 MG/ML IJ SOLN
80.0000 mg | Freq: Two times a day (BID) | INTRAMUSCULAR | Status: DC
  Administered 2019-01-07 – 2019-01-08 (×2): 80 mg via INTRAVENOUS
  Filled 2019-01-07 (×2): qty 8

## 2019-01-07 MED ORDER — FUROSEMIDE 10 MG/ML IJ SOLN: 80.0000 mg | Freq: Two times a day (BID) | INTRAMUSCULAR | Status: DC

## 2019-01-07 MED ORDER — POTASSIUM CHLORIDE 20 MEQ/15ML (10%) PO SOLN
20.0000 meq | ORAL | Status: AC
  Administered 2019-01-07 (×2): 20 meq
  Filled 2019-01-07: qty 15

## 2019-01-07 MED ORDER — INSULIN DETEMIR 100 UNIT/ML ~~LOC~~ SOLN
10.0000 [IU] | Freq: Every day | SUBCUTANEOUS | Status: DC
  Administered 2019-01-07: 10 [IU] via SUBCUTANEOUS
  Filled 2019-01-07 (×3): qty 0.1

## 2019-01-07 MED ORDER — FUROSEMIDE 10 MG/ML IJ SOLN
40.0000 mg | Freq: Once | INTRAMUSCULAR | Status: AC
  Administered 2019-01-07: 40 mg via INTRAVENOUS
  Filled 2019-01-07: qty 4

## 2019-01-07 MED ORDER — CLONIDINE HCL 0.2 MG PO TABS
0.2000 mg | ORAL_TABLET | Freq: Four times a day (QID) | ORAL | Status: DC
  Administered 2019-01-07 – 2019-01-08 (×3): 0.2 mg
  Filled 2019-01-07 (×4): qty 1

## 2019-01-07 MED ORDER — BISOPROLOL FUMARATE 5 MG PO TABS
2.5000 mg | ORAL_TABLET | Freq: Every day | ORAL | Status: DC
  Administered 2019-01-07 – 2019-01-14 (×8): 2.5 mg via ORAL
  Filled 2019-01-07: qty 1
  Filled 2019-01-07 (×2): qty 0.5
  Filled 2019-01-07: qty 1
  Filled 2019-01-07 (×4): qty 0.5

## 2019-01-07 MED ORDER — DOCUSATE SODIUM 50 MG/5ML PO LIQD
50.0000 mg | Freq: Two times a day (BID) | ORAL | Status: DC
  Administered 2019-01-07 – 2019-01-11 (×8): 50 mg
  Filled 2019-01-07 (×7): qty 10

## 2019-01-07 MED ORDER — BISACODYL 10 MG RE SUPP: 10.0000 mg | Freq: Every day | RECTAL | Status: DC | PRN

## 2019-01-07 MED ORDER — FENTANYL CITRATE (PF) 2500 MCG/50ML IJ SOLN
0.0000 ug/h | Status: DC
  Administered 2019-01-07 (×2): 400 ug/h via INTRAVENOUS
  Administered 2019-01-08: 200 ug/h via INTRAVENOUS
  Administered 2019-01-09: 100 ug/h via INTRAVENOUS
  Filled 2019-01-07 (×4): qty 100

## 2019-01-07 MED ORDER — POTASSIUM CHLORIDE 20 MEQ/15ML (10%) PO SOLN
ORAL | Status: AC
  Filled 2019-01-07: qty 15

## 2019-01-07 MED ORDER — ACETAMINOPHEN 160 MG/5ML PO SOLN
650.0000 mg | Freq: Four times a day (QID) | ORAL | Status: DC | PRN
  Administered 2019-01-08 – 2019-01-09 (×3): 650 mg
  Filled 2019-01-07 (×5): qty 20.3

## 2019-01-07 NOTE — Progress Notes (Signed)
[facility] Progress Note Patient Name: Nicholas Caldwell DOB: [birth year] MRN: [id number]   Date of Service  01/07/2019  HPI/Events of Note  Patient needs restraint orders renewed  eICU Interventions  Restraint orders renewed        Frederik Pear 01/07/2019, 6:38 AM

## 2019-01-07 NOTE — Progress Notes (Signed)
  Echocardiogram 2D Echocardiogram has been performed.  [name] 01/07/2019, 12:14 PM

## 2019-01-07 NOTE — Progress Notes (Signed)
[facility] ADULT ICU REPLACEMENT PROTOCOL FOR AM LAB REPLACEMENT ONLY  The patient does apply for the [facility] Adult ICU Electrolyte Replacment Protocol based on the criteria listed below:   1. Is GFR >/= 40 ml/min? Yes.    Patient's GFR today is >60 2. Is urine output >/= 0.5 ml/kg/hr for the last 6 hours? Yes.   Patient's UOP is 1 ml/kg/hr 3. Is BUN < 60 mg/dL? Yes.    Patient's BUN today is 44 4. Abnormal electrolyte(s): K-3.6 5. Ordered repletion with: per protocol 6. If a panic level lab has been reported, has the CCM MD in charge been notified? Yes.  .   Physician:  Dr Jonetta Speak, Philis Nettle 01/07/2019 6:07 AM

## 2019-01-07 NOTE — Progress Notes (Addendum)
Advanced Heart Failure Rounding Note  PCP-Cardiologist: No primary care provider on file.   Subjective:   Remains intubated. Following commands .  Objective:   Weight Range: 115.2 kg Body mass index is 34.44 kg/m.   Vital Signs:   Temp:  [98.8 F (37.1 C)-101.4 F (38.6 C)] 98.8 F (37.1 C) (03/06 0700) Pulse Rate:  [59-113] 110 (03/06 0827) Resp:  [14-21] 21 (03/06 0728) BP: (130-163)/(59-114) 133/114 (03/06 0800) SpO2:  [100 %] 100 % (03/06 0827) FiO2 (%):  [40 %] 40 % (03/06 0805) Weight:  [115.2 kg] 115.2 kg (03/06 0338) Last BM Date: 01/01/19  Weight change: Filed Weights   01/06/19 0421 01/07/19 0000 01/07/19 0338  Weight: 117.6 kg 115.2 kg 115.2 kg    Intake/Output:   Intake/Output Summary (Last 24 hours) at 01/07/2019 0932 Last data filed at 01/07/2019 0800 Gross per 24 hour  Intake 4161.89 ml  Output 4950 ml  Net -788.11 ml      Physical Exam  CVP 10-11 General:  Intubated, awake  HEENT: ETTl Neck: supple. jVP 10-11. Carotids 2+ bilat; no bruits. No lymphadenopathy or thryomegaly appreciated. Cor: PMI nondisplaced. Irregular  rate & rhythm. No rubs, gallops or murmurs. Lungs: Rhonchi throughout  Abdomen: soft, nontender, nondistended. No hepatosplenomegaly. No bruits or masses. Good bowel sounds. Extremities: no cyanosis, clubbing, rash, edema Neuro: Awake. MAE. Follows commands.    Telemetry  A fib/flutter 90-110s  EKG    N/A   Labs    CBC Recent Labs    01/06/19 0456 01/07/19 0328  WBC 13.3* 14.0*  HGB 9.0* 9.2*  HCT 30.2* 31.4*  MCV 87.8 86.7  PLT 474* 727*   Basic Metabolic Panel Recent Labs    01/06/19 0456 01/06/19 1822 01/07/19 0328  NA 140  --  140  K 3.9  --  3.6  CL 98  --  100  CO2 31  --  30  GLUCOSE 122*  --  137*  BUN 38*  --  44*  CREATININE 1.20  --  1.02  CALCIUM 8.7*  --  8.7*  MG 2.7* 2.3 2.3  PHOS  --  3.8 4.0   Liver Function Tests Recent Labs    01/05/19 0430  AST 24  ALT 15  ALKPHOS 112    BILITOT 0.6  PROT 7.1  ALBUMIN 3.1*   No results for input(s): LIPASE, AMYLASE in the last 72 hours. Cardiac Enzymes No results for input(s): CKTOTAL, CKMB, CKMBINDEX, TROPONINI in the last 72 hours.  BNP: BNP (last 3 results) Recent Labs    11/21/18 2238 12/01/18 0015 12/30/18 1621  BNP 1,739.7* 1,666.7* 1,273.0*    ProBNP (last 3 results) No results for input(s): PROBNP in the last 8760 hours.   D-Dimer No results for input(s): DDIMER in the last 72 hours. Hemoglobin A1C No results for input(s): HGBA1C in the last 72 hours. Fasting Lipid Panel No results for input(s): CHOL, HDL, LDLCALC, TRIG, CHOLHDL, LDLDIRECT in the last 72 hours. Thyroid Function Tests No results for input(s): TSH, T4TOTAL, T3FREE, THYROIDAB in the last 72 hours.  Invalid input(s): FREET3  Other results:   Imaging    Dg Chest Port 1 View  Result Date: 01/07/2019 CLINICAL DATA:  Respiratory failure EXAM: PORTABLE CHEST 1 VIEW COMPARISON:  01/06/2019 FINDINGS: Support devices are stable. Cardiomegaly with vascular congestion, bilateral perihilar and lower lobe airspace opacities. Possible left effusion. No acute bony abnormality. IMPRESSION: No significant change since prior study. Electronically Signed   By: Lennette Bihari  [place] M.D.   On: 01/07/2019 07:17     Medications:     Scheduled Medications: . chlorhexidine gluconate (MEDLINE KIT)  15 mL Mouth Rinse BID  . cloNIDine  0.3 mg Per Tube Q6H  . feeding supplement (PRO-STAT SUGAR FREE 64)  30 mL Per Tube QID  . feeding supplement (VITAL HIGH PROTEIN)  1,000 mL Per Tube Q24H  . furosemide  40 mg Intravenous Q12H  . hydrALAZINE  25 mg Per Tube Q8H  . insulin aspart  3-9 Units Subcutaneous Q4H  . ipratropium-albuterol  3 mL Nebulization Q6H  . isosorbide dinitrate  10 mg Per Tube TID  . latanoprost  1 drop Both Eyes QHS  . mouth rinse  15 mL Mouth Rinse 10 times per day  . methylPREDNISolone (SOLU-MEDROL) injection  20 mg Intravenous Q12H  .  pantoprazole sodium  40 mg Per Tube BID  . potassium chloride  20 mEq Per Tube Q4H    Infusions: . sodium chloride 10 mL/hr at 01/07/19 0800  . cefTAZidime (FORTAZ)  IV Stopped (01/07/19 0540)  . dexmedetomidine (PRECEDEX) IV infusion 0.6 mcg/kg/hr (01/07/19 0800)  . fentaNYL infusion INTRAVENOUS 400 mcg/hr (01/07/19 0800)  . heparin 1,400 Units/hr (01/07/19 0805)    PRN Medications: acetaminophen (TYLENOL) oral liquid 160 mg/5 mL, albuterol, fentaNYL (SUBLIMAZE) injection, labetalol    Patient Profile   Nicholas Caldwell a 73 y.o.malePMH of chronic systolic CHF/nonischemic cardiomyopathy Echo 12/13/2016 LVEF 40-45%, COPD on home oxygen, PAF and history of non compliance.   Admitted with A/C respiratory failure and A/C systolic heart failure.  Assessment/Plan  1. A/C  Hypoxic/Hypercapnic Respiratory Failure Intubated 01/04/19 .  On steroids and fortaz today.     2. A/C Systolic Heart Failure  ECHO Ef 40-45%  . Repeat ECHO now.  CO-OX remains stable.  - CVP 11. Increase lasix to 80 mg twice a day.  -Continue current dose of hydralazine/isordil.  -restart bisoprolol 2.5 mg dialy.   3. PAF Back in A fib/flutter today.  Back on heparin drip. Continue   Not tikosyn or amiodarone due to Qt prolongation. He has been on 2.5 mg bisoprolol at home, I will restart today.   - EKG now.  Will need TEE/DC-CV Monday if doesn't convert over the weekend.   4. Anemia  -H/O GI bleed and received PRBCs 2/8 -Hgb 9.2. Stable. .  Had coffee ground emesis in OF tube. Resolved.  Started on protonix  5. Alkalosis: Suspect compensation for respiratory acidosis.   Had 2 days of diamox. CO2 31  Length of Stay: 8  Nicholas Clegg, NP  01/07/2019, 9:32 AM  Advanced Heart Failure Team Pager [phone] (M-F; 7a - 4p)  Please contact [facility] for night-coverage after hours (4p -7a ) and weekends on [email or web address]  Patient seen with NP, agree with the above note.   Intubated for hypercarbic  respiratory failure.  CVP 10-11 today.  Echo was repeated and shows EF 45-50%, mild LVH, severe RV dilation and dysfunction.   On exam, intubated, JVP 10 cm, regular S1S2, decreased BS at bases, no edema.   1. Acute/chronic systolic CHF: Nonischemic cardiomyopathy based on cath in 2/20. Echo this admission with EF 45-50% but severe RV dilation/dysfunction. Severe COPD/OHS/OSAon home oxygen is a confounder and likely cause of RV failure/cor pulmonale. CVP 10-11, co-ox 70% -Increase Lasix to 80 mg IV bid with CVP higher.  Creatinine stable.  -Continue hydralazine 25 mg tid with isordil 10 tid while intubated. - Can add back bisoprolol 2.5 mg daily  today. - BP not elevated, I will titrate down on clonidine, decrease to 0.2 q6 hrs.  - Think he would be a good Cardiomems candidateeventually. 2. Atrial fibrillation: Paroxysmal. He went into atrial fibrillation this morning. On Xarelto at home. - Has not been felt to be a candidate for Tikosyn and amiodarone due to QT prolongation and severe lung disease. - Heparin gtt restarted given no further coffee grounds. - If he remains in atrial fibrillation on Monday, would plan TEE-guided DCCV on Monday.  3. Pulmonary: Has COPD, wears home oxygen at baseline. Suspect co-existing COPD exacerbation with diffuse wheezing. Today, WBCs went up to 13 with Tm 101.5.  Now treating with ceftazidime for HCAP.  -Continue nebs and steroids per CCM. - On ceftazidime for HCAP.  4. Anemia: H/o GI bleeding. Hemoglobin stable in setting of coffee grounds emesis, now resolved.   - Continue PPI. - heparin gtt restarted.   5. OHS/OSA: Hypercarbic respiratory failure in setting of OHS/OSA and COPD. OSA diagnosed on sleep study, but having a lot of trouble getting CPAP from the [place]. Will need to see if we can get outside of [place]. I suspect that OHS/OSA plays a large role in his RV failure via hypoxic pulmonary vasoconstriction.  - Vent management per CCM,  will need CPAP long-term.  Loralie Champagne 01/07/2019 4:33 PM

## 2019-01-07 NOTE — Progress Notes (Signed)
[place] for heparin Indication: atrial fibrillation  No Known Allergies  Patient Measurements: Height: 6' (182.9 cm) Weight: 253 lb 15.5 oz (115.2 kg) IBW/kg (Calculated) : 77.6 Heparin Dosing Weight: 107 kg  Vital Signs: Temp: 99.8 F (37.7 C) (03/06 0406) Temp Source: Oral (03/06 0406) BP: 152/73 (03/06 0600) Pulse Rate: 77 (03/06 0600)  Labs: Recent Labs    01/05/19 0414 01/05/19 0430  01/06/19 0153 01/06/19 0456 01/06/19 1816 01/07/19 0328  HGB 8.9*  --    < > 8.8* 9.0*  --  9.2*  HCT 30.8*  --    < > 30.3* 30.2*  --  31.4*  PLT 465*  --   --   --  474*  --  457*  APTT  --  72*  --   --   --  83* 88*  HEPARINUNFRC  --  0.93*  --   --   --  0.63 0.89*  CREATININE  --  1.36*  --   --  1.20  --  1.02   < > = values in this interval not displayed.    Estimated Creatinine Clearance: 88.3 mL/min (by C-G formula based on SCr of 1.02 mg/dL).   Medical History: Past Medical History:  Diagnosis Date  . Atrial flutter ([place])    a. recurrent AFlutter with RVR;  b. Amiodarone Rx started 4/16  . CAD (coronary artery disease)    a. LHC 1/16:  mLAD diffuse disease, pLCx mild disease, dLCx with disease but too small for PCI, RCA ok, EF 25-30%  . Chronic pain   . Chronic systolic CHF (congestive heart failure) ([place])   . COPD (chronic obstructive pulmonary disease) ([place])   . Diabetes mellitus without complication ([place])   . Hypercholesteremia   . Hypertension   . NICM (nonischemic cardiomyopathy) ([place])    a.dx 2016. b. 2D echo 06/2016 - Last echo 07/01/16: mod dilated LV, mod LVH, EF 25-30%, mild-mod MR, sev LAE, mild-mod reduced RV systolic function, mild-mod TR, PASP 47mHG.  .Marland KitchenPAF (paroxysmal atrial fibrillation) (H[facility])    On amio - ot a candidate for flecainide due to cardiomyopathy, not a candidate for Tikosyn due to prolonged QT, and felt to be a poor candidate for ablation given left atrial size.  . [place]monary hypertension ([place]   .  Tobacco abuse     Medications:  Scheduled:  . chlorhexidine gluconate (MEDLINE KIT)  15 mL Mouth Rinse BID  . cloNIDine  0.3 mg Per Tube Q6H  . feeding supplement (PRO-STAT SUGAR FREE 64)  30 mL Per Tube QID  . feeding supplement (VITAL HIGH PROTEIN)  1,000 mL Per Tube Q24H  . furosemide  40 mg Intravenous Q12H  . hydrALAZINE  25 mg Per Tube Q8H  . insulin aspart  3-9 Units Subcutaneous Q4H  . ipratropium-albuterol  3 mL Nebulization Q6H  . isosorbide dinitrate  10 mg Per Tube TID  . latanoprost  1 drop Both Eyes QHS  . mouth rinse  15 mL Mouth Rinse 10 times per day  . methylPREDNISolone (SOLU-MEDROL) injection  20 mg Intravenous Q12H  . pantoprazole sodium  40 mg Per Tube BID  . potassium chloride  20 mEq Per Tube Q4H    Assessment: 74yom admitted with respiratory and systolic heart failure, on Xarelto PTA for Afib (last dose on 3/2_0 ). Presented with AMS with inability to protect his airway - now intubated and transferred to 85M.   Was transitioned to heparin gtt  on 3/3 but held on 3/4 due to concerns for GI bleed. No bleeding reported per RN, Hgb trending up to 9. Restarting heparin gtt per MD at this time. Was recently therapeutic at 1500 units/hr. SCR trending down to 1.2  Will monitor both aPTT and heparin level until correlate given recent Xarelto administration.   Heparin level this AM 0.89 with aPTT 88 - not yet correlating. Hgb up to 9.2, Scr down to 1.02, no reports of bleed or infusion related issues per RN.  Goal of Therapy:  Heparin level 0.3-0.5 units/ml - will aim for a lower goal due to inc risk of bleed aPTT 66-85 seconds Monitor platelets by anticoagulation protocol: Yes   Plan:  Decrease heparin infusion to 1,400 units/hr Check anti-Xa and aPTT in 8 hours Check anti-Xa and aPTT daily Continue to monitor H&H and platelets, and s/sx of bleeding   Juanell Fairly, PharmD PGY1 Pharmacy Resident Phone [phone] 01/07/2019 6:33 AM

## 2019-01-07 NOTE — Progress Notes (Signed)
NAME:  Nicholas Caldwell, MRN:  [id number], DOB:  26-Dec-1945, LOS: 8 ADMISSION DATE:  12/30/2018, CONSULTATION DATE:  3/3 REFERRING MD:  Florene Glen, CHIEF COMPLAINT:  Acute hypoxic and hypercarbic respiratory failure    Brief History   73 yo male presented with volume overload from acute on chronic biventricular CHF.  Developed progressive hypoxia, hypercapnia, and altered mental status.  Transferred to ICU 3/03 and intubated.  Third hospitalization since January 2020.  Past Medical History  Nonischemic cardiomyopathy, with left ventricular ejection fraction of 40 to 45%, chronic respiratory failure on nasal cannula at baseline,  Paroxysmal atrial fibrillation, sleep apnea, hypertension, prior noncompliance, HLD, s/p splenectomy  Significant Hospital Events   2/27: Admitted with decompensated heart failure 3/03: Critical care consulted, patient obtunded, more hypercarbic, worsening respiratory failure. 3/05: resume heparin gtt, fever, start ABx for HCAP 3/06: tried on pressure support >> a fib with RVR  Consults:  Heart failure team 3/2  Procedures:  Endotracheal tube 3/3 >>   Significant Diagnostic Tests:  LHC/RHC 12/06/18 >> nonobstructive CAD, mod pulmonary HTN, PVR not significantly elevated CT chest 01/03/19 >> small/mod Rt and small Lt effusion, compressive ATX, CM  Micro Data:  Respiratory culture 3/3 >> oral flora  Antimicrobials:  Tressie Ellis 3/05 >>  Interim history/subjective:  A fib with RVR during SBT.  Objective   Blood pressure (!) 116/52, pulse 90, temperature 98.8 F (37.1 C), temperature source Oral, resp. rate (!) 21, height 6' (1.829 m), weight 115.2 kg, SpO2 100 %. CVP:  [8 mmHg-14 mmHg] 10 mmHg  Vent Mode: PRVC FiO2 (%):  [40 %] 40 % Set Rate:  [14 bmp] 14 bmp Vt Set:  [620 mL] 620 mL PEEP:  [5 cmH20] 5 cmH20 Pressure Support:  [15 cmH20] 15 cmH20 Plateau Pressure:  [20 cmH20-23 cmH20] 23 cmH20   Intake/Output Summary (Last 24 hours) at 01/07/2019 1020 Last data  filed at 01/07/2019 0947 Gross per 24 hour  Intake 4243.25 ml  Output 4900 ml  Net -656.75 ml   Filed Weights   01/06/19 0421 01/07/19 0000 01/07/19 0338  Weight: 117.6 kg 115.2 kg 115.2 kg    Examination:  General - alert Eyes - pupils reactive ENT - ETT in place Cardiac - irregular, tachycardic Chest - decreased BS at bases, b/l rhonchi Abdomen - soft, non tender, + bowel sounds Extremities - 1+ edema Skin - no rashes Neuro - RASS 0  CXR (reviewed by me) >> Lt > Rt basilar ASD   Resolved Hospital Problem list     Assessment & Plan:   Acute on chronic hypoxic/hypercapnic respiratory failure. OSA/OHS. COPD. Pleural effusions with atelectasis. Plan - full vent support  - prn BDs - change to prednisone 20 mg daily and wean off as able - f/u CXR - not ready for extubation trial given A fib during SBT  Fever 3/05 with concern for HCAP. Plan - day 2 of fortaz  Acute on chronic combined CHF with cor pulmonale. Paroxysmal A fib on xarelto as outpt >> a fib with RVR during SBT 3/06. HTN, HLD. Prolonged QTc. Plan - continue zebeta, catapres, hydralazine, isordril - lasix increased to 80 mg bid - continue heparin gtt - heart failure team following  Acute metabolic encephalopathy 2nd to respiratory failure. Plan - RASS goal 0 to -1  Gastritis. Plan - continue protonix bid and tube feeds  DM type II with steroid induced hyperglycemia. Plan - SSI - add 10 units levemir daily on 3/06  Anemia of critical illness and chronic  disease. Plan - f/u CBC - transfuse for Hb < 7   Best practice:  Diet: tube feeds DVT prophylaxis: heparin gtt GI prophylaxis: Protonix Mobility: Bedrest Code Status: Full code Family Communication: updated family at bedside Disposition: ICU  Labs    CMP Latest Ref Rng & Units 01/07/2019 01/06/2019 01/05/2019  Glucose 70 - 99 mg/dL 137(H) 122(H) 207(H)  BUN 8 - 23 mg/dL 44(H) 38(H) 32(H)  Creatinine 0.61 - 1.24 mg/dL 1.02 1.20  1.36(H)  Sodium 135 - 145 mmol/L 140 140 139  Potassium 3.5 - 5.1 mmol/L 3.6 3.9 3.8  Chloride 98 - 111 mmol/L 100 98 96(L)  CO2 22 - 32 mmol/L _0 Calcium 8.9 - 10.3 mg/dL 8.7(L) 8.7(L) 8.8(L)  Total Protein 6.5 - 8.1 g/dL - - 7.1  Total Bilirubin 0.3 - 1.2 mg/dL - - 0.6  Alkaline Phos 38 - 126 U/L - - 112  AST 15 - 41 U/L - - 24  ALT 0 - 44 U/L - - 15   CBC Latest Ref Rng & Units 01/07/2019 01/06/2019 01/06/2019  WBC 4.0 - 10.5 K/uL 14.0(H) 13.3(H) -  Hemoglobin 13.0 - 17.0 g/dL 9.2(L) 9.0(L) 8.8(L)  Hematocrit 39.0 - 52.0 % 31.4(L) 30.2(L) 30.3(L)  Platelets 150 - 400 K/uL 457(H) 474(H) -   ABG    Component Value Date/Time   PHART 7.402 01/06/2019 0300   PCO2ART 51.7 (H) 01/06/2019 0300   PO2ART 83.5 01/06/2019 0300   HCO3 31.4 (H) 01/06/2019 0300   TCO2 47 (H) 01/04/2019 1710   ACIDBASEDEF 2.0 02/19/2015 1003   O2SAT 69.7 01/07/2019 0328   CBG (last 3)  Recent Labs    01/06/19 2330 01/07/19 0359 01/07/19 0726  GLUCAP 221* 153* 210*   CC time 33 minutes  Chesley Mires, MD [place] 01/07/2019, 10:20 AM

## 2019-01-07 NOTE — Progress Notes (Signed)
ANTICOAGULATION CONSULT NOTE   Pharmacy Consult for heparin Indication: atrial fibrillation  No Known Allergies  Patient Measurements: Height: 6' (182.9 cm) Weight: 253 lb 15.5 oz (115.2 kg) IBW/kg (Calculated) : 77.6 Heparin Dosing Weight: 107 kg  Vital Signs: Temp: 99 F (37.2 C) (03/06 1100) Temp Source: Oral (03/06 1100) BP: 104/52 (03/06 1400) Pulse Rate: 72 (03/06 1400)  Labs: Recent Labs    01/05/19 0414 01/05/19 0430  01/06/19 0153 01/06/19 0456 01/06/19 1816 01/07/19 0328 01/07/19 1335  HGB 8.9*  --    < > 8.8* 9.0*  --  9.2*  --   HCT 30.8*  --    < > 30.3* 30.2*  --  31.4*  --   PLT 465*  --   --   --  474*  --  457*  --   APTT  --  72*  --   --   --  83* 88*  --   HEPARINUNFRC  --  0.93*  --   --   --  0.63 0.89* 0.82*  CREATININE  --  1.36*  --   --  1.20  --  1.02  --    < > = values in this interval not displayed.    Estimated Creatinine Clearance: 88.3 mL/min (by C-G formula based on SCr of 1.02 mg/dL).   Medical History: Past Medical History:  Diagnosis Date  . Atrial flutter ([place])    a. recurrent AFlutter with RVR;  b. Amiodarone Rx started 4/16  . CAD (coronary artery disease)    a. LHC 1/16:  mLAD diffuse disease, pLCx mild disease, dLCx with disease but too small for PCI, RCA ok, EF 25-30%  . Chronic pain   . Chronic systolic CHF (congestive heart failure) ([place])   . COPD (chronic obstructive pulmonary disease) ([place])   . Diabetes mellitus without complication ([place])   . Hypercholesteremia   . Hypertension   . NICM (nonischemic cardiomyopathy) ([place])    a.dx 2016. b. 2D echo 06/2016 - Last echo 07/01/16: mod dilated LV, mod LVH, EF 25-30%, mild-mod MR, sev LAE, mild-mod reduced RV systolic function, mild-mod TR, PASP 50mHG.  .Marland KitchenPAF (paroxysmal atrial fibrillation) ([facility])    On amio - ot a candidate for flecainide due to cardiomyopathy, not a candidate for Tikosyn due to prolonged QT, and felt to be a poor candidate for ablation given left atrial  size.  . Pulmonary hypertension ([place]   . Tobacco abuse     Medications:  Scheduled:  . bisoprolol  2.5 mg Oral Daily  . chlorhexidine gluconate (MEDLINE KIT)  15 mL Mouth Rinse BID  . cloNIDine  0.3 mg Per Tube Q6H  . feeding supplement (PRO-STAT SUGAR FREE 64)  30 mL Per Tube QID  . feeding supplement (VITAL HIGH PROTEIN)  1,000 mL Per Tube Q24H  . furosemide  80 mg Intravenous BID  . hydrALAZINE  25 mg Per Tube Q8H  . insulin aspart  3-9 Units Subcutaneous Q4H  . insulin detemir  10 Units Subcutaneous Daily  . isosorbide dinitrate  10 mg Per Tube TID  . latanoprost  1 drop Both Eyes QHS  . mouth rinse  15 mL Mouth Rinse 10 times per day  . pantoprazole sodium  40 mg Per Tube BID  . predniSONE  20 mg Per Tube Q breakfast    Assessment: 73yom admitted with respiratory and systolic heart failure, on Xarelto PTA for Afib (last dose on 3/2_0 ). Presented with AMS with inability to  protect his airway - now intubated and transferred to 26M.   Was transitioned to heparin gtt on 3/3 but held on 3/4 due to concerns for GI bleed. No bleeding reported per RN, Hgb trending up to 9. Restarting heparin gtt per MD at this time. Was recently therapeutic at 1500 units/hr. SCR trending down to 1.2  Heparin level this afternoon remains elevated 0.89. Hgb up to 9.2, Scr down to 1.02, no reports of bleed or infusion related issues per RN.  Goal of Therapy:  Heparin level 0.3-0.5 units/ml - will aim for a lower goal due to inc risk of bleed aPTT 66-85 seconds Monitor platelets by anticoagulation protocol: Yes   Plan:  Hold heparin x1 hour Decrease heparin infusion to 1,200 units/hr Check anti-Xa in 8 hours Check anti-Xa daily Continue to monitor H&H and platelets, and s/sx of bleeding   Juanell Fairly, PharmD PGY1 Pharmacy Resident Phone [phone] 01/07/2019 2:53 PM

## 2019-01-08 ENCOUNTER — Inpatient Hospital Stay (HOSPITAL_COMMUNITY): Payer: Medicare Other

## 2019-01-08 LAB — CBC
HCT: 31.4 % — ABNORMAL LOW (ref 39.0–52.0)
Hemoglobin: 9.2 g/dL — ABNORMAL LOW (ref 13.0–17.0)
MCH: 25.5 pg — ABNORMAL LOW (ref 26.0–34.0)
MCHC: 29.3 g/dL — ABNORMAL LOW (ref 30.0–36.0)
MCV: 87 fL (ref 80.0–100.0)
Platelets: 386 10*3/uL (ref 150–400)
RBC: 3.61 MIL/uL — ABNORMAL LOW (ref 4.22–5.81)
RDW: 18.3 % — ABNORMAL HIGH (ref 11.5–15.5)
WBC: 14.8 10*3/uL — AB (ref 4.0–10.5)
nRBC: 0 % (ref 0.0–0.2)

## 2019-01-08 LAB — GLUCOSE, CAPILLARY
GLUCOSE-CAPILLARY: 144 mg/dL — AB (ref 70–99)
Glucose-Capillary: 134 mg/dL — ABNORMAL HIGH (ref 70–99)
Glucose-Capillary: 171 mg/dL — ABNORMAL HIGH (ref 70–99)
Glucose-Capillary: 185 mg/dL — ABNORMAL HIGH (ref 70–99)
Glucose-Capillary: 225 mg/dL — ABNORMAL HIGH (ref 70–99)
Glucose-Capillary: 215 mg/dL — ABNORMAL HIGH (ref 70–99)

## 2019-01-08 LAB — BASIC METABOLIC PANEL
Anion gap: 8 (ref 5–15)
BUN: 57 mg/dL — AB (ref 8–23)
CO2: 34 mmol/L — ABNORMAL HIGH (ref 22–32)
CREATININE: 1.13 mg/dL (ref 0.61–1.24)
Calcium: 8.9 mg/dL (ref 8.9–10.3)
Chloride: 103 mmol/L (ref 98–111)
GFR calc non Af Amer: >60 mL/min (ref >60)
Glucose, Bld: 143 mg/dL — ABNORMAL HIGH (ref 70–99)
Potassium: 3 mmol/L — ABNORMAL LOW (ref 3.5–5.1)
SODIUM: 145 mmol/L (ref 135–145)

## 2019-01-08 LAB — COOXEMETRY PANEL
Carboxyhemoglobin: 1.6 % — ABNORMAL HIGH (ref 0.5–1.5)
Methemoglobin: 1 % (ref 0.0–1.5)
O2 Saturation: 68.7 %
TOTAL HEMOGLOBIN: 9.7 g/dL — AB (ref 12.0–16.0)

## 2019-01-08 LAB — HEPARIN LEVEL (UNFRACTIONATED): Heparin Unfractionated: 0.66 IU/mL (ref 0.30–0.70)

## 2019-01-08 LAB — APTT: aPTT: 71 seconds — ABNORMAL HIGH (ref 24–36)

## 2019-01-08 MED ORDER — CLONIDINE HCL 0.2 MG PO TABS
0.2000 mg | ORAL_TABLET | Freq: Four times a day (QID) | ORAL | Status: DC
  Administered 2019-01-08 – 2019-01-12 (×14): 0.2 mg
  Filled 2019-01-08 (×16): qty 1

## 2019-01-08 MED ORDER — POTASSIUM CHLORIDE 20 MEQ/15ML (10%) PO SOLN
30.0000 meq | ORAL | Status: AC
  Administered 2019-01-08 (×2): 30 meq
  Filled 2019-01-08 (×2): qty 30

## 2019-01-08 MED ORDER — FUROSEMIDE 10 MG/ML IJ SOLN
60.0000 mg | Freq: Two times a day (BID) | INTRAMUSCULAR | Status: DC
  Administered 2019-01-08 – 2019-01-09 (×2): 60 mg via INTRAVENOUS
  Filled 2019-01-08 (×2): qty 6

## 2019-01-08 MED ORDER — INSULIN DETEMIR 100 UNIT/ML ~~LOC~~ SOLN
15.0000 [IU] | Freq: Every day | SUBCUTANEOUS | Status: DC
  Administered 2019-01-08 – 2019-01-14 (×7): 15 [IU] via SUBCUTANEOUS
  Filled 2019-01-08 (×8): qty 0.15

## 2019-01-08 MED ORDER — CLONIDINE HCL 0.1 MG PO TABS
0.1000 mg | ORAL_TABLET | Freq: Four times a day (QID) | ORAL | Status: DC
  Filled 2019-01-08: qty 1

## 2019-01-08 NOTE — Progress Notes (Signed)
ANTICOAGULATION CONSULT NOTE - Follow Up Consult  Pharmacy Consult for heparin Indication: atrial fibrillation  Labs: Recent Labs    01/05/19 0414  01/06/19 0153 01/06/19 0456 01/06/19 1816 01/07/19 0328 01/07/19 1335 01/07/19 2042 01/07/19 2245  HGB 8.9*   < > 8.8* 9.0*  --  9.2*  --   --   --   HCT 30.8*   < > 30.3* 30.2*  --  31.4*  --   --   --   PLT 465*  --   --  474*  --  457*  --   --   --   APTT  --    < >  --   --  83* 88*  --   --  67*  HEPARINUNFRC  --    < >  --   --  0.63 0.89* 0.82*  --  0.59  CREATININE  --    < >  --  1.20  --  1.02  --  1.20  --    < > = values in this interval not displayed.    Assessment/Plan:  73yo male therapeutic on heparin after rate change. Will continue gtt at current rate and confirm stable with am labs.   Wynona Neat, PharmD, BCPS  01/08/2019,12:23 AM

## 2019-01-08 NOTE — Progress Notes (Signed)
Came to room for routine vent check.  While I was in the room, noted VT slowly decreasing, pt became tachycardic.  ambu bagged/lavage/suctioned for moderate amount of thick/tenacious secretions w/ ? Small plugs.  Pt became easier to bag, HR reduced to 80's.  Pt placed back on vent w/ good VT returned.  RN aware.

## 2019-01-08 NOTE — Progress Notes (Signed)
Patient ID: Nicholas Caldwell, male   DOB: Nov 03, 1946, 73 y.o.   MRN: [id number]     Advanced Heart Failure Rounding Note  PCP-Cardiologist: No primary care provider on file.   Subjective:    Weaning on vent this morning.  Awake/alert.  Tmax 102.9.  I/Os net negative about 2L.  CVP 9 this morning, co-ox 69%.  Creatinine stable.    CXR with left base infiltrate.   Objective:   Weight Range: 111.9 kg Body mass index is 33.46 kg/m.   Vital Signs:   Temp:  [99 F (37.2 C)-102.9 F (39.4 C)] 100.5 F (38.1 C) (03/07 0707) Pulse Rate:  [59-111] 79 (03/07 0735) Resp:  [13-17] 17 (03/07 0735) BP: (104-150)/(48-120) 107/58 (03/07 0630) SpO2:  [96 %-100 %] 100 % (03/07 0735) FiO2 (%):  [40 %] 40 % (03/07 0735) Weight:  [111.9 kg] 111.9 kg (03/07 0500) Last BM Date: 01/01/19  Weight change: Filed Weights   01/07/19 0000 01/07/19 0338 01/08/19 0500  Weight: 115.2 kg 115.2 kg 111.9 kg    Intake/Output:   Intake/Output Summary (Last 24 hours) at 01/08/2019 0822 Last data filed at 01/08/2019 3143 Gross per 24 hour  Intake 3078.89 ml  Output 4750 ml  Net -1671.11 ml      Physical Exam  CVP 9 General: NAD, intubated Neck: JVP 8 cm, no thyromegaly or thyroid nodule.  Lungs: Dependent crackles.  CV: Nondisplaced PMI.  Heart irregular S1/S2, no S3/S4, no murmur.  No peripheral edema.   Abdomen: Soft, nontender, no hepatosplenomegaly, no distention.  Skin: Intact without lesions or rashes.  Neurologic: Alert, follows commands.  Extremities: No clubbing or cyanosis.  HEENT: Normal.    Telemetry  A fib/flutter 80s (personally reviewed)  EKG    N/A   Labs    CBC Recent Labs    01/07/19 0328 01/08/19 0323  WBC 14.0* 14.8*  HGB 9.2* 9.2*  HCT 31.4* 31.4*  MCV 86.7 87.0  PLT 457* 888   Basic Metabolic Panel Recent Labs    01/07/19 0328 01/07/19 1640 01/07/19 2042 01/08/19 0323  NA 140  --  144 145  K 3.6  --  3.7 3.0*  CL 100  --  101 103  CO2 30  --  31 34*    GLUCOSE 137*  --  245* 143*  BUN 44*  --  58* 57*  CREATININE 1.02  --  1.20 1.13  CALCIUM 8.7*  --  8.5* 8.9  MG 2.3 2.3  --   --   PHOS 4.0 3.4  --   --    Liver Function Tests No results for input(s): AST, ALT, ALKPHOS, BILITOT, PROT, ALBUMIN in the last 72 hours. No results for input(s): LIPASE, AMYLASE in the last 72 hours. Cardiac Enzymes No results for input(s): CKTOTAL, CKMB, CKMBINDEX, TROPONINI in the last 72 hours.  BNP: BNP (last 3 results) Recent Labs    11/21/18 2238 12/01/18 0015 12/30/18 1621  BNP 1,739.7* 1,666.7* 1,273.0*    ProBNP (last 3 results) No results for input(s): PROBNP in the last 8760 hours.   D-Dimer No results for input(s): DDIMER in the last 72 hours. Hemoglobin A1C No results for input(s): HGBA1C in the last 72 hours. Fasting Lipid Panel No results for input(s): CHOL, HDL, LDLCALC, TRIG, CHOLHDL, LDLDIRECT in the last 72 hours. Thyroid Function Tests No results for input(s): TSH, T4TOTAL, T3FREE, THYROIDAB in the last 72 hours.  Invalid input(s): FREET3  Other results:   Imaging    Dg Chest  Port 1 View  Result Date: 01/08/2019 CLINICAL DATA:  Respiratory failure EXAM: PORTABLE CHEST 1 VIEW COMPARISON:  01/07/2019 FINDINGS: Cardiac shadow is stable. Endotracheal tube, gastric catheter and right jugular central line are again seen and stable. The lungs are well aerated bilaterally. Persistent left basilar opacity with associated effusion is seen. Mild right basilar changes have improved somewhat in the interval from the prior exam. IMPRESSION: Persistent left basilar opacity with associated effusion. Electronically Signed   By: Inez Catalina M.D.   On: 01/08/2019 08:07     Medications:     Scheduled Medications: . bisoprolol  2.5 mg Oral Daily  . chlorhexidine gluconate (MEDLINE KIT)  15 mL Mouth Rinse BID  . cloNIDine  0.1 mg Per Tube Q6H  . docusate  50 mg Per Tube BID  . feeding supplement (PRO-STAT SUGAR FREE 64)  30 mL Per  Tube QID  . feeding supplement (VITAL HIGH PROTEIN)  1,000 mL Per Tube Q24H  . furosemide  60 mg Intravenous BID  . hydrALAZINE  25 mg Per Tube Q8H  . insulin aspart  3-9 Units Subcutaneous Q4H  . insulin detemir  10 Units Subcutaneous Daily  . isosorbide dinitrate  10 mg Per Tube TID  . latanoprost  1 drop Both Eyes QHS  . mouth rinse  15 mL Mouth Rinse 10 times per day  . pantoprazole sodium  40 mg Per Tube BID  . potassium chloride  30 mEq Per Tube Q4H  . predniSONE  20 mg Per Tube Q breakfast    Infusions: . sodium chloride Stopped (01/08/19 0540)  . cefTAZidime (FORTAZ)  IV 200 mL/hr at 01/08/19 0600  . dexmedetomidine (PRECEDEX) IV infusion 0.2 mcg/kg/hr (01/08/19 0750)  . fentaNYL infusion INTRAVENOUS 200 mcg/hr (01/08/19 0750)  . heparin 1,200 Units/hr (01/08/19 0634)    PRN Medications: acetaminophen (TYLENOL) oral liquid 160 mg/5 mL, bisacodyl, fentaNYL (SUBLIMAZE) injection, ipratropium-albuterol, labetalol    Patient Profile   Nicholas Caldwell a 73 y.o.malePMH of chronic systolic CHF/nonischemic cardiomyopathy Echo 12/13/2016 LVEF 40-45%, COPD on home oxygen, PAF and history of non compliance.   Admitted with A/C respiratory failure and A/C systolic heart failure.  Assessment/Plan   1. Acute/chronic systolic CHF: Nonischemic cardiomyopathy based on cath in 2/20. Echo this admission with EF 45-50% but severe RV dilation/dysfunction. Severe COPD/OHS/OSAon home oxygen is a confounder and likely cause of RV failure/cor pulmonale. CVP 9, co-ox 69%.  Good diuresis yesterday with IV lasix.  -Can decrease Lasix to 60 mg IV bid today.  -Continue hydralazine 25 mg tid with isordil 10 tid while intubated. - Continue bisoprolol 2.5 mg daily. - BP not elevated, I will titrate down on clonidine again, decrease to 0.1 q6 hrs.  - Think he would be a good Cardiomems candidateeventually. 2. Atrial fibrillation: Paroxysmal. He went into atrial fibrillation after  intubation. On Xarelto at home. - Has not been felt to be a candidate for Tikosyn and amiodarone due to QT prolongation and severe lung disease. - Heparin gtt restarted given no further coffee grounds. - If he remains in atrial fibrillation on Monday, would plan TEE-guided DCCV on Monday.  3. Pulmonary: Has COPD, wears home oxygen at baseline. Suspect COPD exacerbation with LLL PNA. WBCs 14.8, Tm 102.7.  Now treating with ceftazidime for HCAP.  -Continue nebs and steroids per CCM. - On ceftazidime for HCAP.  4. Anemia: H/o GI bleeding. Hemoglobin stable in setting of coffee grounds emesis, now resolved.   - Continue PPI. - heparin gtt restarted.  5. OHS/OSA: Hypercarbic respiratory failure in setting of OHS/OSA and COPD. OSA diagnosed on sleep study, but having a lot of trouble getting CPAP from the [place]. Will need to see if we can get outside of [place]. I suspect that OHS/OSA plays a large role in his RV failure via hypoxic pulmonary vasoconstriction.  - Vent management per CCM, will need CPAP long-term.  Loralie Champagne 01/08/2019 8:22 AM

## 2019-01-08 NOTE — Progress Notes (Signed)
ANTICOAGULATION CONSULT NOTE   Pharmacy Consult for heparin Indication: atrial fibrillation  No Known Allergies  Patient Measurements: Height: 6' (182.9 cm) Weight: 246 lb 11.1 oz (111.9 kg) IBW/kg (Calculated) : 77.6 Heparin Dosing Weight: 107 kg  Vital Signs: Temp: 99.4 F (37.4 C) (03/07 0800) Temp Source: Axillary (03/07 0800) BP: 132/52 (03/07 0800) Pulse Rate: 82 (03/07 0800)  Labs: Recent Labs    01/06/19 0456  01/07/19 0328 01/07/19 1335 01/07/19 2042 01/07/19 2245 01/08/19 0323  HGB 9.0*  --  9.2*  --   --   --  9.2*  HCT 30.2*  --  31.4*  --   --   --  31.4*  PLT 474*  --  457*  --   --   --  386  APTT  --    < > 88*  --   --  67* 71*  HEPARINUNFRC  --    < > 0.89* 0.82*  --  0.59 0.66  CREATININE 1.20  --  1.02  --  1.20  --  1.13   < > = values in this interval not displayed.    Estimated Creatinine Clearance: 78.6 mL/min (by C-G formula based on SCr of 1.13 mg/dL).  Assessment: 31 yom admitted with respiratory and systolic heart failure, on Xarelto PTA for Afib (last dose on 3/2_0 ). Presented with AMS with inability to protect his airway - now intubated and transferred to 1M.   Was transitioned to heparin gtt on 3/3 but held on 3/4 due to concerns for GI bleed.   HL 0.66, aptt 71, close to correlating, but will check aptt 1 more time before following HL  Goal of Therapy:  Heparin level 0.3-0.5 units/ml - will aim for a lower goal due to inc risk of bleed aPTT 66-85 seconds Monitor platelets by anticoagulation protocol: Yes   Plan:  heparin infusion 1,200 units/hr Daily hep lvl aptt  Levester Fresh, PharmD, BCPS, BCCCP Clinical Pharmacist [phone]  Please check AMION for all [place] numbers  01/08/2019 9:16 AM

## 2019-01-08 NOTE — Progress Notes (Signed)
[facility] ADULT ICU REPLACEMENT PROTOCOL FOR AM LAB REPLACEMENT ONLY  The patient does apply for the [facility] Adult ICU Electrolyte Replacment Protocol based on the criteria listed below:   1. Is GFR >/= 40 ml/min? Yes.    Patient's GFR today is >60 2. Is urine output >/= 0.5 ml/kg/hr for the last 6 hours? Yes.   Patient's UOP is .6 ml/kg/hr 3. Is BUN < 60 mg/dL? Yes.    Patient's BUN today is 57 4. Abnormal electrolyte(s): K-3.0 5. Ordered repletion with: per protocol 6. If a panic level lab has been reported, has the CCM MD in charge been notified? Yes.  .   Physician:  Dr. Harlene Ramus, Philis [place] 01/08/2019 5:26 AM

## 2019-01-08 NOTE — Progress Notes (Signed)
NAME:  Nicholas Caldwell, MRN:  [id number], DOB:  Apr 22, 1946, LOS: 9 ADMISSION DATE:  12/30/2018, CONSULTATION DATE:  3/3 REFERRING MD:  Florene Glen, CHIEF COMPLAINT:  Acute hypoxic and hypercarbic respiratory failure    Brief History   73 yo male presented with volume overload from acute on chronic biventricular CHF.  Developed progressive hypoxia, hypercapnia, and altered mental status.  Transferred to ICU 3/03 and intubated.  Third hospitalization since January 2020.  Past Medical History  Nonischemic cardiomyopathy, with left ventricular ejection fraction of 40 to 45%, chronic respiratory failure on nasal cannula at baseline,  Paroxysmal atrial fibrillation, sleep apnea, hypertension, prior noncompliance, HLD, s/p splenectomy  Significant Hospital Events   2/27: Admitted with decompensated heart failure 3/03: Critical care consulted, patient obtunded, more hypercarbic, worsening respiratory failure. 3/05: resume heparin gtt, fever, start ABx for HCAP 3/06: tried on pressure support >> a fib with RVR  Consults:  Heart failure team 3/2  Procedures:  Endotracheal tube 3/3 >>   Significant Diagnostic Tests:  LHC/RHC 12/06/18 >> nonobstructive CAD, mod pulmonary HTN, PVR not significantly elevated CT chest 01/03/19 >> small/mod Rt and small Lt effusion, compressive ATX, CM  Micro Data:  Respiratory culture 3/3 >> oral flora  Antimicrobials:  Tressie Ellis 3/05 >>  Interim history/subjective:  Remains on vent.  Objective   Blood pressure (!) 107/58, pulse 79, temperature (!) 100.5 F (38.1 C), temperature source Axillary, resp. rate 17, height 6' (1.829 m), weight 111.9 kg, SpO2 100 %. CVP:  [7 mmHg-27 mmHg] 7 mmHg  Vent Mode: CPAP;PSV FiO2 (%):  [40 %] 40 % Set Rate:  [14 bmp] 14 bmp Vt Set:  [620 mL] 620 mL PEEP:  [5 cmH20] 5 cmH20 Pressure Support:  [15 cmH20] 15 cmH20 Plateau Pressure:  [21 cmH20] 21 cmH20   Intake/Output Summary (Last 24 hours) at 01/08/2019 3748 Last data filed at  01/08/2019 0800 Gross per 24 hour  Intake 3078.89 ml  Output 5200 ml  Net -2121.11 ml   Filed Weights   01/07/19 0000 01/07/19 0338 01/08/19 0500  Weight: 115.2 kg 115.2 kg 111.9 kg    Examination:  General - alert Eyes - pupils reactive ENT - ETT in place Cardiac - irregular Chest - scattered rhonchi Abdomen - soft, non tender, + bowel sounds Extremities - 1+ edema Skin - no rashes Neuro - RASS 0, follows commands  CXR (reviewed by me) - Lt > Rt perihilar ASD  Resolved Hospital Problem list     Assessment & Plan:   Acute on chronic hypoxic/hypercapnic respiratory failure. OSA/OHS. COPD. Pleural effusions with atelectasis. Plan - can wean on pressure support as able - not ready for extubation trial - continue prednisone 20 mg daily for now - f/u CXR  Fever 3/05 with concern for HCAP. Plan - Day 3/7 fortaz  Acute on chronic combined CHF with cor pulmonale. Paroxysmal A fib on xarelto as outpt >> a fib with RVR during SBT 3/06. HTN, HLD. Prolonged QTc. Plan - changed lasix to 60 mg bid on 3/07 - continue hydralazine, isordil - clonidine decreased to 0.1 mg q6h on 3/07 - continue heparin gtt - might need DCCV on 3/09 if he remains in a fib - heart failure team following  Acute metabolic encephalopathy 2nd to respiratory failure. Plan - RASS goal 0 to -1  Gastritis. Plan - continue protonix bid and tube feeds  DM type II with steroid induced hyperglycemia. Plan - SSI - change levemir to 15 units daily on 3/07  Anemia of  critical illness and chronic disease. Plan - f/u CBC - transfuse for Hb < 7   Best practice:  Diet: tube feeds DVT prophylaxis: heparin gtt GI prophylaxis: Protonix Mobility: Bedrest Code Status: Full code Family Communication: updated wife at bedside 3/06 Disposition: ICU  Labs    CMP Latest Ref Rng & Units 01/08/2019 01/07/2019 01/07/2019  Glucose 70 - 99 mg/dL 143(H) 245(H) 137(H)  BUN 8 - 23 mg/dL 57(H) 58(H) 44(H)    Creatinine 0.61 - 1.24 mg/dL 1.13 1.20 1.02  Sodium 135 - 145 mmol/L 145 144 140  Potassium 3.5 - 5.1 mmol/L 3.0(L) 3.7 3.6  Chloride 98 - 111 mmol/L 103 101 100  CO2 22 - 32 mmol/L 34(H) 31 30  Calcium 8.9 - 10.3 mg/dL 8.9 8.5(L) 8.7(L)  Total Protein 6.5 - 8.1 g/dL - - -  Total Bilirubin 0.3 - 1.2 mg/dL - - -  Alkaline Phos 38 - 126 U/L - - -  AST 15 - 41 U/L - - -  ALT 0 - 44 U/L - - -   CBC Latest Ref Rng & Units 01/08/2019 01/07/2019 01/06/2019  WBC 4.0 - 10.5 K/uL 14.8(H) 14.0(H) 13.3(H)  Hemoglobin 13.0 - 17.0 g/dL 9.2(L) 9.2(L) 9.0(L)  Hematocrit 39.0 - 52.0 % 31.4(L) 31.4(L) 30.2(L)  Platelets 150 - 400 K/uL 386 457(H) 474(H)   ABG    Component Value Date/Time   PHART 7.402 01/06/2019 0300   PCO2ART 51.7 (H) 01/06/2019 0300   PO2ART 83.5 01/06/2019 0300   HCO3 31.4 (H) 01/06/2019 0300   TCO2 47 (H) 01/04/2019 1710   ACIDBASEDEF 2.0 02/19/2015 1003   O2SAT 68.7 01/08/2019 0340   CBG (last 3)  Recent Labs    01/07/19 2337 01/08/19 0339 01/08/19 0706  GLUCAP 203* 134* 171*   CC time 31 minutes  Chesley Mires, MD [facility] Pulmonary/Critical Care 01/08/2019, 8:52 AM

## 2019-01-09 ENCOUNTER — Inpatient Hospital Stay (HOSPITAL_COMMUNITY): Payer: Medicare Other

## 2019-01-09 LAB — COOXEMETRY PANEL
Carboxyhemoglobin: 1.6 % — ABNORMAL HIGH (ref 0.5–1.5)
Methemoglobin: 1.7 % — ABNORMAL HIGH (ref 0.0–1.5)
O2 Saturation: 74.5 %
Total hemoglobin: 9.9 g/dL — ABNORMAL LOW (ref 12.0–16.0)

## 2019-01-09 LAB — GLUCOSE, CAPILLARY
GLUCOSE-CAPILLARY: 219 mg/dL — AB (ref 70–99)
Glucose-Capillary: 149 mg/dL — ABNORMAL HIGH (ref 70–99)
Glucose-Capillary: 151 mg/dL — ABNORMAL HIGH (ref 70–99)
Glucose-Capillary: 217 mg/dL — ABNORMAL HIGH (ref 70–99)
Glucose-Capillary: 224 mg/dL — ABNORMAL HIGH (ref 70–99)
Glucose-Capillary: 236 mg/dL — ABNORMAL HIGH (ref 70–99)

## 2019-01-09 LAB — APTT: aPTT: 83 seconds — ABNORMAL HIGH (ref 24–36)

## 2019-01-09 LAB — BASIC METABOLIC PANEL
ANION GAP: 12 (ref 5–15)
Anion gap: 11 (ref 5–15)
Anion gap: 7 (ref 5–15)
BUN: 68 mg/dL — ABNORMAL HIGH (ref 8–23)
BUN: 61 mg/dL — ABNORMAL HIGH (ref 8–23)
BUN: 69 mg/dL — ABNORMAL HIGH (ref 8–23)
CHLORIDE: 106 mmol/L (ref 98–111)
CO2: 35 mmol/L — ABNORMAL HIGH (ref 22–32)
CO2: 35 mmol/L — ABNORMAL HIGH (ref 22–32)
CO2: 37 mmol/L — ABNORMAL HIGH (ref 22–32)
Calcium: 8.7 mg/dL — ABNORMAL LOW (ref 8.9–10.3)
Calcium: 8.8 mg/dL — ABNORMAL LOW (ref 8.9–10.3)
Calcium: 9.2 mg/dL (ref 8.9–10.3)
Chloride: 103 mmol/L (ref 98–111)
Chloride: 114 mmol/L — ABNORMAL HIGH (ref 98–111)
Creatinine, Ser: 1.25 mg/dL — ABNORMAL HIGH (ref 0.61–1.24)
Creatinine, Ser: 1.17 mg/dL (ref 0.61–1.24)
Creatinine, Ser: 1.22 mg/dL (ref 0.61–1.24)
GFR calc Af Amer: >60 mL/min (ref >60)
GFR calc Af Amer: >60 mL/min (ref >60)
GFR calc Af Amer: >60 mL/min (ref >60)
GFR calc non Af Amer: 58 mL/min — ABNORMAL LOW (ref >60)
GFR calc non Af Amer: >60 mL/min (ref >60)
GFR calc non Af Amer: 60 mL/min — ABNORMAL LOW (ref >60)
Glucose, Bld: 227 mg/dL — ABNORMAL HIGH (ref 70–99)
Glucose, Bld: 202 mg/dL — ABNORMAL HIGH (ref 70–99)
Glucose, Bld: 154 mg/dL — ABNORMAL HIGH (ref 70–99)
POTASSIUM: 3.3 mmol/L — AB (ref 3.5–5.1)
Potassium: 5.8 mmol/L — ABNORMAL HIGH (ref 3.5–5.1)
Potassium: 2.5 mmol/L — CL (ref 3.5–5.1)
Sodium: 150 mmol/L — ABNORMAL HIGH (ref 135–145)
Sodium: 152 mmol/L — ABNORMAL HIGH (ref 135–145)
Sodium: 158 mmol/L — ABNORMAL HIGH (ref 135–145)

## 2019-01-09 LAB — HEPARIN LEVEL (UNFRACTIONATED)
Heparin Unfractionated: 0.8 IU/mL — ABNORMAL HIGH (ref 0.30–0.70)
Heparin Unfractionated: 0.89 IU/mL — ABNORMAL HIGH (ref 0.30–0.70)

## 2019-01-09 LAB — CBC
HCT: 32.1 % — ABNORMAL LOW (ref 39.0–52.0)
Hemoglobin: 9.1 g/dL — ABNORMAL LOW (ref 13.0–17.0)
MCH: 25.4 pg — ABNORMAL LOW (ref 26.0–34.0)
MCHC: 28.3 g/dL — AB (ref 30.0–36.0)
MCV: 89.7 fL (ref 80.0–100.0)
Platelets: 377 10*3/uL (ref 150–400)
RBC: 3.58 MIL/uL — ABNORMAL LOW (ref 4.22–5.81)
RDW: 19.7 % — ABNORMAL HIGH (ref 11.5–15.5)
WBC: 13.6 10*3/uL — ABNORMAL HIGH (ref 4.0–10.5)
nRBC: 0 % (ref 0.0–0.2)

## 2019-01-09 LAB — MAGNESIUM: Magnesium: 2.5 mg/dL — ABNORMAL HIGH (ref 1.7–2.4)

## 2019-01-09 MED ORDER — POTASSIUM CHLORIDE 20 MEQ/15ML (10%) PO SOLN
40.0000 meq | Freq: Once | ORAL | Status: AC
  Administered 2019-01-10: 40 meq
  Filled 2019-01-09: qty 30

## 2019-01-09 MED ORDER — PREDNISONE 5 MG/5ML PO SOLN
10.0000 mg | Freq: Every day | ORAL | Status: DC
  Administered 2019-01-10 – 2019-01-11 (×2): 10 mg
  Filled 2019-01-09 (×3): qty 10

## 2019-01-09 MED ORDER — HEPARIN (PORCINE) 25000 UT/250ML-% IV SOLN
1000.0000 [IU]/h | INTRAVENOUS | Status: AC
  Administered 2019-01-10: 1000 [IU]/h via INTRAVENOUS
  Filled 2019-01-09 (×2): qty 250

## 2019-01-09 MED ORDER — POTASSIUM CHLORIDE 20 MEQ/15ML (10%) PO SOLN
40.0000 meq | ORAL | Status: AC
  Administered 2019-01-09 (×3): 40 meq
  Filled 2019-01-09 (×3): qty 30

## 2019-01-09 NOTE — Progress Notes (Signed)
Patient ID: Erskin Zinda, male   DOB: 1945/11/11, 73 y.o.   MRN: [id number]     Advanced Heart Failure Rounding Note  PCP-Cardiologist: No primary care provider on file.   Subjective:    Weaning on vent this morning, weaning at PSV 12/5.  Awake/alert.  Tmax 101.9.  I/Os net negative 1477 cc.  CVP 5 this morning, co-ox 74%.  BUN rising.    K high this morning but may be hemolyzed.   CXR with left base infiltrate.   Objective:   Weight Range: 113.6 kg Body mass index is 33.97 kg/m.   Vital Signs:   Temp:  [99.7 F (37.6 C)-101.9 F (38.8 C)] 100.8 F (38.2 C) (03/08 0703) Pulse Rate:  [61-103] 103 (03/08 0800) Resp:  [14-27] 27 (03/08 0800) BP: (98-144)/(50-71) 144/63 (03/08 0800) SpO2:  [97 %-100 %] 97 % (03/08 0800) FiO2 (%):  [40 %] 40 % (03/08 0751) Weight:  [113.6 kg] 113.6 kg (03/08 0346) Last BM Date: 01/01/19  Weight change: Filed Weights   01/07/19 0338 01/08/19 0500 01/09/19 0346  Weight: 115.2 kg 111.9 kg 113.6 kg    Intake/Output:   Intake/Output Summary (Last 24 hours) at 01/09/2019 0919 Last data filed at 01/09/2019 0800 Gross per 24 hour  Intake 2385.95 ml  Output 3650 ml  Net -1264.05 ml      Physical Exam  CVP 5 General: NAD Neck: No JVD, no thyromegaly or thyroid nodule.  Lungs: Rhonchi bilaterally.  CV: Nondisplaced PMI.  Heart irregular S1/S2, no S3/S4, no murmur.  No peripheral edema.   Abdomen: Soft, nontender, no hepatosplenomegaly, no distention.  Skin: Intact without lesions or rashes.  Neurologic: Alert and oriented x 3.  Psych: Normal affect. Extremities: No clubbing or cyanosis.  HEENT: Normal.    Telemetry  A fib 80s (personally reviewed)  EKG    N/A   Labs    CBC Recent Labs    01/08/19 0323 01/09/19 0434  WBC 14.8* 13.6*  HGB 9.2* 9.1*  HCT 31.4* 32.1*  MCV 87.0 89.7  PLT 386 621   Basic Metabolic Panel Recent Labs    01/07/19 0328 01/07/19 1640  01/08/19 0323 01/09/19 0434  NA 140  --    < > 145 150*    K 3.6  --    < > 3.0* 5.8*  CL 100  --    < > 103 103  CO2 30  --    < > 34* 35*  GLUCOSE 137*  --    < > 143* 227*  BUN 44*  --    < > 57* 68*  CREATININE 1.02  --    < > 1.13 1.25*  CALCIUM 8.7*  --    < > 8.9 8.7*  MG 2.3 2.3  --   --   --   PHOS 4.0 3.4  --   --   --    < > = values in this interval not displayed.   Liver Function Tests No results for input(s): AST, ALT, ALKPHOS, BILITOT, PROT, ALBUMIN in the last 72 hours. No results for input(s): LIPASE, AMYLASE in the last 72 hours. Cardiac Enzymes No results for input(s): CKTOTAL, CKMB, CKMBINDEX, TROPONINI in the last 72 hours.  BNP: BNP (last 3 results) Recent Labs    11/21/18 2238 12/01/18 0015 12/30/18 1621  BNP 1,739.7* 1,666.7* 1,273.0*    ProBNP (last 3 results) No results for input(s): PROBNP in the last 8760 hours.   D-Dimer No results for input(s):  DDIMER in the last 72 hours. Hemoglobin A1C No results for input(s): HGBA1C in the last 72 hours. Fasting Lipid Panel No results for input(s): CHOL, HDL, LDLCALC, TRIG, CHOLHDL, LDLDIRECT in the last 72 hours. Thyroid Function Tests No results for input(s): TSH, T4TOTAL, T3FREE, THYROIDAB in the last 72 hours.  Invalid input(s): FREET3  Other results:   Imaging    Dg Chest Port 1 View  Result Date: 01/09/2019 CLINICAL DATA:  Respiratory failure EXAM: PORTABLE CHEST 1 VIEW COMPARISON:  01/08/2019 FINDINGS: Cardiac shadow is mildly enlarged but stable. Endotracheal tube, gastric catheter and right jugular central line are again seen and stable. Persistent left basilar atelectasis with associated effusion is noted. No new focal infiltrate is seen. IMPRESSION: Stable appearance of the chest from the previous day. Electronically Signed   By: Inez Catalina M.D.   On: 01/09/2019 07:20     Medications:     Scheduled Medications: . bisoprolol  2.5 mg Oral Daily  . chlorhexidine gluconate (MEDLINE KIT)  15 mL Mouth Rinse BID  . cloNIDine  0.2 mg Per Tube  Q6H  . docusate  50 mg Per Tube BID  . feeding supplement (PRO-STAT SUGAR FREE 64)  30 mL Per Tube QID  . feeding supplement (VITAL HIGH PROTEIN)  1,000 mL Per Tube Q24H  . hydrALAZINE  25 mg Per Tube Q8H  . insulin aspart  3-9 Units Subcutaneous Q4H  . insulin detemir  15 Units Subcutaneous Daily  . isosorbide dinitrate  10 mg Per Tube TID  . latanoprost  1 drop Both Eyes QHS  . mouth rinse  15 mL Mouth Rinse 10 times per day  . pantoprazole sodium  40 mg Per Tube BID  . predniSONE  20 mg Per Tube Q breakfast    Infusions: . sodium chloride 10 mL/hr at 01/09/19 0800  . cefTAZidime (FORTAZ)  IV Stopped (01/09/19 0602)  . dexmedetomidine (PRECEDEX) IV infusion 0.4 mcg/kg/hr (01/09/19 0800)  . fentaNYL infusion INTRAVENOUS 100 mcg/hr (01/09/19 0800)  . heparin 1,200 Units/hr (01/09/19 0807)    PRN Medications: acetaminophen (TYLENOL) oral liquid 160 mg/5 mL, bisacodyl, fentaNYL (SUBLIMAZE) injection, ipratropium-albuterol, labetalol    Patient Profile   Jacson Rapaport a 73 y.o.malePMH of chronic systolic CHF/nonischemic cardiomyopathy Echo 12/13/2016 LVEF 40-45%, COPD on home oxygen, PAF and history of non compliance.   Admitted with A/C respiratory failure and A/C systolic heart failure.  Assessment/Plan   1. Acute/chronic systolic CHF: Nonischemic cardiomyopathy based on cath in 2/20. Echo this admission with EF 45-50% but severe RV dilation/dysfunction. Severe COPD/OHS/OSAon home oxygen is a confounder and likely cause of RV failure/cor pulmonale. CVP 5, co-ox 74%.  Good diuresis yesterday with IV lasix.  -He got Lasix 60 mg IV this morning.  With low CVP and rising BUN, hold afternoon Lasix and reassess need for diuresis tomorrow.  - Suspect high K may be false (hemolysis), repeat BMET now.  -Continue hydralazine 25 mg tid with isordil 10 tid while intubated. - Continue bisoprolol 2.5 mg daily. - He is on clonidine as part of a clinical trial, titrate per  protocol.  - Think he would be a good Cardiomems candidateeventually. 2. Atrial fibrillation: Paroxysmal. He went into atrial fibrillation after intubation. On Xarelto at home. - Has not been felt to be a candidate for Tikosyn and amiodarone due to QT prolongation and severe lung disease. - Heparin gtt restarted given no further coffee grounds. - If he remains in atrial fibrillation on Monday, would plan TEE-guided DCCV on  Monday.  3. Pulmonary: Has COPD, wears home oxygen at baseline. Suspect COPD exacerbation with LLL PNA. WBCs 13.6, Tm 101.0.  Now treating with ceftazidime for HCAP.  -Continue nebs and steroids per CCM. - On ceftazidime for HCAP.  4. Anemia: H/o GI bleeding. Hemoglobin stable in setting of coffee grounds emesis, now resolved.   - Continue PPI. - heparin gtt restarted.   5. OHS/OSA: Hypercarbic respiratory failure in setting of OHS/OSA and COPD. OSA diagnosed on sleep study, but having a lot of trouble getting CPAP from the [place]. Will need to see if we can get outside of [place]. I suspect that OHS/OSA plays a large role in his RV failure via hypoxic pulmonary vasoconstriction.  - Vent management per CCM, will need CPAP long-term.  Loralie Champagne 01/09/2019 9:19 AM

## 2019-01-09 NOTE — Progress Notes (Signed)
ANTICOAGULATION CONSULT NOTE   Pharmacy Consult for heparin Indication: atrial fibrillation  No Known Allergies  Patient Measurements: Height: 6' (182.9 cm) Weight: 250 lb 7.1 oz (113.6 kg) IBW/kg (Calculated) : 77.6 Heparin Dosing Weight: 107 kg  Vital Signs: Temp: 99.2 F (37.3 C) (03/08 1915) Temp Source: Oral (03/08 1915) BP: 124/66 (03/08 1800) Pulse Rate: 75 (03/08 1800)  Labs: Recent Labs    01/07/19 0328  01/07/19 2245 01/08/19 0323 01/09/19 0434 01/09/19 0942 01/09/19 1845  HGB 9.2*  --   --  9.2* 9.1*  --   --   HCT 31.4*  --   --  31.4* 32.1*  --   --   PLT 457*  --   --  386 377  --   --   APTT 88*  --  67* 71* 83*  --   --   HEPARINUNFRC 0.89*   < > 0.59 0.66 0.80*  --  0.89*  CREATININE 1.02   < >  --  1.13 1.25* 1.17  --    < > = values in this interval not displayed.    Estimated Creatinine Clearance: 76.4 mL/min (by C-G formula based on SCr of 1.17 mg/dL).  Assessment: 49 yom admitted with respiratory and systolic heart failure, on Xarelto PTA for Afib (last dose on 3/2_0 ). Presented with AMS with inability to protect his airway - now intubated and transferred to 79M.   Was transitioned to heparin gtt on 3/3 but held on 3/4 due to concerns for GI bleed.   HL 0.89 - Hep Lvl rising so likely not due to affects of rivaroxaban  Goal of Therapy:  Heparin level 0.3-0.5 units/ml - will aim for a lower goal due to inc risk of bleed Monitor platelets by anticoagulation protocol: Yes   Plan:  Hold x 1 hr Decreased heparin to 900 units/hr Daily hep lvl cbc  Levester Fresh, PharmD, BCPS, BCCCP Clinical Pharmacist [phone]  Please check AMION for all [place] numbers  01/09/2019 7:43 PM

## 2019-01-09 NOTE — Progress Notes (Signed)
[facility] Progress Note Patient Name: Nicholas Caldwell DOB: 06-23-1946 MRN: [id number]   Date of Service  01/09/2019  HPI/Events of Note  K+ = 3.3 and Creatinine = 1.22.  eICU Interventions  Will replace K+.      Intervention Category Major Interventions: Electrolyte abnormality - evaluation and management  Jamyah Folk [place] 01/09/2019, 11:40 PM

## 2019-01-09 NOTE — Progress Notes (Signed)
[place] for heparin Indication: atrial fibrillation  No Known Allergies  Patient Measurements: Height: 6' (182.9 cm) Weight: 250 lb 7.1 oz (113.6 kg) IBW/kg (Calculated) : 77.6 Heparin Dosing Weight: 107 kg  Vital Signs: Temp: 100.8 F (38.2 C) (03/08 0703) Temp Source: Axillary (03/08 0703) BP: 144/63 (03/08 0800) Pulse Rate: 103 (03/08 0800)  Labs: Recent Labs    01/07/19 0328  01/07/19 2042 01/07/19 2245 01/08/19 0323 01/09/19 0434  HGB 9.2*  --   --   --  9.2* 9.1*  HCT 31.4*  --   --   --  31.4* 32.1*  PLT 457*  --   --   --  386 377  APTT 88*  --   --  67* 71* 83*  HEPARINUNFRC 0.89*   < >  --  0.59 0.66 0.80*  CREATININE 1.02  --  1.20  --  1.13 1.25*   < > = values in this interval not displayed.    Estimated Creatinine Clearance: 71.6 mL/min (A) (by C-G formula based on SCr of 1.25 mg/dL (H)).  Assessment: 76 yom admitted with respiratory and systolic heart failure, on Xarelto PTA for Afib (last dose on 3/2_0 ). Presented with AMS with inability to protect his airway - now intubated and transferred to 7M.   Was transitioned to heparin gtt on 3/3 but held on 3/4 due to concerns for GI bleed.   HL 0.8, aptt 83 - not necessarily correlating, but Hep Lvl rising so likely not due to affects of rivaroxaban  Goal of Therapy:  Heparin level 0.3-0.5 units/ml - will aim for a lower goal due to inc risk of bleed Monitor platelets by anticoagulation protocol: Yes   Plan:  Decreased heparin to 1100 units/hr 1700 hep lvl - will stop checking aPTT  Levester Fresh, PharmD, BCPS, BCCCP Clinical Pharmacist [phone]  Please check AMION for all [place] numbers  01/09/2019 10:16 AM

## 2019-01-09 NOTE — Progress Notes (Signed)
NAME:  Nicholas Caldwell, MRN:  [id number], DOB:  12-08-1945, LOS: 82 ADMISSION DATE:  12/30/2018, CONSULTATION DATE:  3/3 REFERRING MD:  Florene Glen, CHIEF COMPLAINT:  Acute hypoxic and hypercarbic respiratory failure    Brief History   73 yo male presented with volume overload from acute on chronic biventricular CHF.  Developed progressive hypoxia, hypercapnia, and altered mental status.  Transferred to ICU 3/03 and intubated.  Third hospitalization since January 2020.  Past Medical History  Nonischemic cardiomyopathy, with left ventricular ejection fraction of 40 to 45%, chronic respiratory failure on nasal cannula at baseline,  Paroxysmal atrial fibrillation, sleep apnea, hypertension, prior noncompliance, HLD, s/p splenectomy  Significant Hospital Events   2/27: Admitted with decompensated heart failure 3/03: Critical care consulted, patient obtunded, more hypercarbic, worsening respiratory failure. 3/05: resume heparin gtt, fever, start ABx for HCAP 3/06: tried on pressure support >> a fib with RVR 3/08: doing better with pressure support  Consults:  Heart failure team 3/2  Procedures:  Endotracheal tube 3/3 >>   Significant Diagnostic Tests:  LHC/RHC 12/06/18 >> nonobstructive CAD, mod pulmonary HTN, PVR not significantly elevated CT chest 01/03/19 >> small/mod Rt and small Lt effusion, compressive ATX, CM  Micro Data:  Respiratory culture 3/3 >> oral flora  Antimicrobials:  Tressie Ellis 3/05 >>  Interim history/subjective:  On vent.  Objective   Blood pressure (!) 144/63, pulse (!) 103, temperature (!) 100.8 F (38.2 C), temperature source Axillary, resp. rate (!) 27, height 6' (1.829 m), weight 113.6 kg, SpO2 97 %. CVP:  [2 mmHg-8 mmHg] 5 mmHg  Vent Mode: PSV;CPAP FiO2 (%):  [40 %] 40 % Set Rate:  [14 bmp] 14 bmp Vt Set:  [620 mL] 620 mL PEEP:  [5 cmH20] 5 cmH20 Pressure Support:  [12 cmH20-15 cmH20] 12 cmH20 Plateau Pressure:  [22 cmH20-24 cmH20] 22 cmH20   Intake/Output  Summary (Last 24 hours) at 01/09/2019 1884 Last data filed at 01/09/2019 0800 Gross per 24 hour  Intake 2385.95 ml  Output 3650 ml  Net -1264.05 ml   Filed Weights   01/07/19 0338 01/08/19 0500 01/09/19 0346  Weight: 115.2 kg 111.9 kg 113.6 kg    Examination:  General - alert Eyes - pupils reactive ENT - ETT in place Cardiac - irregular Chest - decreased BS Abdomen - soft, non tender, + bowel sounds Extremities - no cyanosis, clubbing, or edema Skin - no rashes Neuro - follows commands, moves extremities  CXR (reviewed by me) - Lt sided ASD   Resolved Hospital Problem list     Assessment & Plan:   Acute on chronic hypoxic/hypercapnic respiratory failure. OSA/OHS. COPD. Pleural effusions with atelectasis. Plan - pressure support wean as able - getting closer to extubation trial; anticipate he might be ready in next 24 to 48 hrs - f/u CXR - change prednisone to 10 mg daily - will need Bipap after extubation >> might need to send him home with Trilogy home vent while arranging for outpt sleep testing  Fever 3/05 with concern for HCAP. Plan - day 4/7 fortaz  Acute on chronic combined CHF with cor pulmonale. Paroxysmal A fib on xarelto as outpt >> a fib with RVR during SBT 3/06. HTN, HLD. Prolonged QTc. Plan - even to negative fluid balance - continue clonidine, zebeta, hydralazine, isordil - continue heparin gtt - heart failure team following  Acute metabolic encephalopathy 2nd to respiratory failure. Plan - RASS goal 0 to -1  Gastritis. Plan - change protonix to 40 mg daily  DM type  II with steroid induced hyperglycemia. Plan - SSI with levemir 15 units daily  Anemia of critical illness and chronic disease. Plan - f/u CBC - transfuse for Hb < 7   Best practice:  Diet: tube feeds DVT prophylaxis: heparin gtt GI prophylaxis: Protonix Mobility: Bedrest Code Status: Full code Family Communication: updated wife at bedside 3/07 Disposition:  ICU  Labs    CMP Latest Ref Rng & Units 01/09/2019 01/08/2019 01/07/2019  Glucose 70 - 99 mg/dL 227(H) 143(H) 245(H)  BUN 8 - 23 mg/dL 68(H) 57(H) 58(H)  Creatinine 0.61 - 1.24 mg/dL 1.25(H) 1.13 1.20  Sodium 135 - 145 mmol/L 150(H) 145 144  Potassium 3.5 - 5.1 mmol/L 5.8(H) 3.0(L) 3.7  Chloride 98 - 111 mmol/L 103 103 101  CO2 22 - 32 mmol/L 35(H) 34(H) 31  Calcium 8.9 - 10.3 mg/dL 8.7(L) 8.9 8.5(L)  Total Protein 6.5 - 8.1 g/dL - - -  Total Bilirubin 0.3 - 1.2 mg/dL - - -  Alkaline Phos 38 - 126 U/L - - -  AST 15 - 41 U/L - - -  ALT 0 - 44 U/L - - -   CBC Latest Ref Rng & Units 01/09/2019 01/08/2019 01/07/2019  WBC 4.0 - 10.5 K/uL 13.6(H) 14.8(H) 14.0(H)  Hemoglobin 13.0 - 17.0 g/dL 9.1(L) 9.2(L) 9.2(L)  Hematocrit 39.0 - 52.0 % 32.1(L) 31.4(L) 31.4(L)  Platelets 150 - 400 K/uL 377 386 457(H)   ABG    Component Value Date/Time   PHART 7.402 01/06/2019 0300   PCO2ART 51.7 (H) 01/06/2019 0300   PO2ART 83.5 01/06/2019 0300   HCO3 31.4 (H) 01/06/2019 0300   TCO2 47 (H) 01/04/2019 1710   ACIDBASEDEF 2.0 02/19/2015 1003   O2SAT 74.5 01/09/2019 0429   CBG (last 3)  Recent Labs    01/08/19 2340 01/09/19 0338 01/09/19 0702  GLUCAP 144* 224* 151*   D/w [name]  CC time 31 minutes  Chesley Mires, MD [facility] Pulmonary/Critical Care 01/09/2019, 9:28 AM

## 2019-01-09 NOTE — Progress Notes (Signed)
CRITICAL VALUE ALERT  Critical Value:  K 2.5   Date & Time Notied:  01/09/19 10:57 AM  Provider Notified: PCCM Sood  Orders Received/Actions taken: Awaiting orders

## 2019-01-10 ENCOUNTER — Inpatient Hospital Stay (HOSPITAL_COMMUNITY): Payer: Medicare Other

## 2019-01-10 ENCOUNTER — Encounter (HOSPITAL_COMMUNITY)
Admission: EM | Disposition: A | Discharge status: Rehabilitation Facility | Payer: Self-pay | Source: Home / Self Care | Attending: Pulmonary Disease

## 2019-01-10 DIAGNOSIS — I351 Nonrheumatic aortic (valve) insufficiency: Secondary | ICD-10-CM

## 2019-01-10 DIAGNOSIS — J810 Acute pulmonary edema: Secondary | ICD-10-CM

## 2019-01-10 DIAGNOSIS — I4891 Unspecified atrial fibrillation: Secondary | ICD-10-CM

## 2019-01-10 DIAGNOSIS — I361 Nonrheumatic tricuspid (valve) insufficiency: Secondary | ICD-10-CM

## 2019-01-10 LAB — BASIC METABOLIC PANEL
Anion gap: 8 (ref 5–15)
BUN: 70 mg/dL — ABNORMAL HIGH (ref 8–23)
CALCIUM: 9.2 mg/dL (ref 8.9–10.3)
CO2: 35 mmol/L — ABNORMAL HIGH (ref 22–32)
Chloride: 113 mmol/L — ABNORMAL HIGH (ref 98–111)
Creatinine, Ser: 1.24 mg/dL (ref 0.61–1.24)
GFR calc Af Amer: >60 mL/min (ref >60)
GFR calc non Af Amer: 59 mL/min — ABNORMAL LOW (ref >60)
Glucose, Bld: 248 mg/dL — ABNORMAL HIGH (ref 70–99)
Potassium: 3.4 mmol/L — ABNORMAL LOW (ref 3.5–5.1)
SODIUM: 156 mmol/L — AB (ref 135–145)

## 2019-01-10 LAB — GLUCOSE, CAPILLARY
Glucose-Capillary: 216 mg/dL — ABNORMAL HIGH (ref 70–99)
Glucose-Capillary: 148 mg/dL — ABNORMAL HIGH (ref 70–99)
Glucose-Capillary: 248 mg/dL — ABNORMAL HIGH (ref 70–99)
Glucose-Capillary: 239 mg/dL — ABNORMAL HIGH (ref 70–99)
Glucose-Capillary: 161 mg/dL — ABNORMAL HIGH (ref 70–99)
Glucose-Capillary: 124 mg/dL — ABNORMAL HIGH (ref 70–99)

## 2019-01-10 LAB — CBC
HEMATOCRIT: 31.7 % — AB (ref 39.0–52.0)
HEMOGLOBIN: 9 g/dL — AB (ref 13.0–17.0)
MCH: 25.8 pg — ABNORMAL LOW (ref 26.0–34.0)
MCHC: 28.4 g/dL — ABNORMAL LOW (ref 30.0–36.0)
MCV: 90.8 fL (ref 80.0–100.0)
Platelets: 359 10*3/uL (ref 150–400)
RBC: 3.49 MIL/uL — ABNORMAL LOW (ref 4.22–5.81)
RDW: 19.1 % — AB (ref 11.5–15.5)
WBC: 15.2 10*3/uL — ABNORMAL HIGH (ref 4.0–10.5)
nRBC: 0 % (ref 0.0–0.2)

## 2019-01-10 LAB — MAGNESIUM: Magnesium: 2.6 mg/dL — ABNORMAL HIGH (ref 1.7–2.4)

## 2019-01-10 LAB — APTT
aPTT: 57 seconds — ABNORMAL HIGH (ref 24–36)
aPTT: 75 seconds — ABNORMAL HIGH (ref 24–36)

## 2019-01-10 LAB — COOXEMETRY PANEL
Carboxyhemoglobin: 1.1 % (ref 0.5–1.5)
METHEMOGLOBIN: 1.7 % — AB (ref 0.0–1.5)
O2 Saturation: 64 %
Total hemoglobin: 10.4 g/dL — ABNORMAL LOW (ref 12.0–16.0)

## 2019-01-10 LAB — HEPARIN LEVEL (UNFRACTIONATED): Heparin Unfractionated: 0.63 IU/mL (ref 0.30–0.70)

## 2019-01-10 SURGERY — ECHOCARDIOGRAM, TRANSESOPHAGEAL
Anesthesia: Monitor Anesthesia Care

## 2019-01-10 MED ORDER — SODIUM CHLORIDE 0.9 % IV SOLN: 250.0000 mL | INTRAVENOUS | Status: DC

## 2019-01-10 MED ORDER — SODIUM CHLORIDE 0.9% FLUSH
3.0000 mL | Freq: Two times a day (BID) | INTRAVENOUS | Status: DC
  Administered 2019-01-10 – 2019-01-14 (×4): 3 mL via INTRAVENOUS

## 2019-01-10 MED ORDER — SODIUM CHLORIDE 0.9 % IV SOLN
INTRAVENOUS | Status: DC
  Administered 2019-01-10: 13:00:00 via INTRAVENOUS

## 2019-01-10 MED ORDER — MIDAZOLAM 50MG/50ML (1MG/ML) PREMIX INFUSION
2.0000 mg/h | INTRAVENOUS | Status: DC
  Administered 2019-01-10: 2 mg/h via INTRAVENOUS
  Filled 2019-01-10 (×2): qty 50

## 2019-01-10 MED ORDER — POTASSIUM CHLORIDE 20 MEQ/15ML (10%) PO SOLN
40.0000 meq | Freq: Three times a day (TID) | ORAL | Status: AC
  Administered 2019-01-10 (×2): 40 meq
  Filled 2019-01-10 (×2): qty 30

## 2019-01-10 MED ORDER — FUROSEMIDE 10 MG/ML IJ SOLN
40.0000 mg | Freq: Four times a day (QID) | INTRAMUSCULAR | Status: AC
  Administered 2019-01-10 (×3): 40 mg via INTRAVENOUS
  Filled 2019-01-10 (×3): qty 4

## 2019-01-10 MED ORDER — POTASSIUM CHLORIDE 10 MEQ/50ML IV SOLN
10.0000 meq | INTRAVENOUS | Status: AC
  Administered 2019-01-10 (×4): 10 meq via INTRAVENOUS
  Filled 2019-01-10 (×4): qty 50

## 2019-01-10 MED ORDER — SODIUM CHLORIDE 0.9% FLUSH: 3.0000 mL | INTRAVENOUS | Status: DC | PRN

## 2019-01-10 MED ORDER — FREE WATER
350.0000 mL | Freq: Four times a day (QID) | Status: DC
  Administered 2019-01-10 – 2019-01-11 (×5): 350 mL

## 2019-01-10 NOTE — Progress Notes (Signed)
[facility] Progress Note Patient Name: Nicholas Caldwell DOB: 18-Mar-1946 MRN: [id number]   Date of Service  01/10/2019  HPI/Events of Note  K+ = 3.4 and Creatinine = 1.24.  eICU Interventions  Will replace K+.      Intervention Category Major Interventions: Electrolyte abnormality - evaluation and management  Romina Divirgilio Eugene 01/10/2019, 6:05 AM

## 2019-01-10 NOTE — Progress Notes (Signed)
Patient ID: Nicholas Caldwell, male   DOB: [birth year], 73 y.o.   MRN: [id number]     Advanced Heart Failure Rounding Note  PCP-Cardiologist: No primary care provider on file.   Subjective:    Weaning well on vent.  Tmax 101.3.  CVP higher at 17 this morning.  BUN rising.    He is hypernatremic.   Remains in atrial fibrillation.   CXR with left base infiltrate.   Objective:   Weight Range: 113.3 kg Body mass index is 33.88 kg/m.   Vital Signs:   Temp:  [98.4 F (36.9 C)-102.3 F (39.1 C)] 101.3 F (38.5 C) (03/09 0705) Pulse Rate:  [66-98] 75 (03/09 0700) Resp:  [14-28] 19 (03/09 0700) BP: (85-151)/(54-95) 122/70 (03/09 0700) SpO2:  [96 %-100 %] 99 % (03/09 0754) FiO2 (%):  [40 %] 40 % (03/09 0754) Weight:  [113.3 kg] 113.3 kg (03/09 0400) Last BM Date: 01/01/19  Weight change: Filed Weights   01/08/19 0500 01/09/19 0346 01/10/19 0400  Weight: 111.9 kg 113.6 kg 113.3 kg    Intake/Output:   Intake/Output Summary (Last 24 hours) at 01/10/2019 0812 Last data filed at 01/10/2019 0700 Gross per 24 hour  Intake 2766.3 ml  Output 1845 ml  Net 921.3 ml      Physical Exam  CVP 17 General: NAD, intubated Neck: JVP 12-13, no thyromegaly or thyroid nodule.  Lungs: Crackles at bases. CV: Nondisplaced PMI.  Heart mildly tachy, irregular S1/S2, no S3/S4, no murmur.  No peripheral edema.   Abdomen: Soft, nontender, no hepatosplenomegaly, moderate distention.  Skin: Intact without lesions or rashes.  Neurologic: Alert and oriented x 3.  Psych: Normal affect. Extremities: No clubbing or cyanosis.  HEENT: Normal.    Telemetry  A fib 100s (personally reviewed)  EKG    N/A   Labs    CBC Recent Labs    01/09/19 0434 01/10/19 0403  WBC 13.6* 15.2*  HGB 9.1* 9.0*  HCT 32.1* 31.7*  MCV 89.7 90.8  PLT 377 009   Basic Metabolic Panel Recent Labs    01/07/19 1640  01/09/19 2240 01/10/19 0403  NA  --    < > 158* 156*  K  --    < > 3.3* 3.4*  CL  --    < > 114*  113*  CO2  --    < > 37* 35*  GLUCOSE  --    < > 154* 248*  BUN  --    < > 69* 70*  CREATININE  --    < > 1.22 1.24  CALCIUM  --    < > 9.2 9.2  MG 2.3  --  2.5* 2.6*  PHOS 3.4  --   --   --    < > = values in this interval not displayed.   Liver Function Tests No results for input(s): AST, ALT, ALKPHOS, BILITOT, PROT, ALBUMIN in the last 72 hours. No results for input(s): LIPASE, AMYLASE in the last 72 hours. Cardiac Enzymes No results for input(s): CKTOTAL, CKMB, CKMBINDEX, TROPONINI in the last 72 hours.  BNP: BNP (last 3 results) Recent Labs    11/21/18 2238 12/01/18 0015 12/30/18 1621  BNP 1,739.7* 1,666.7* 1,273.0*    ProBNP (last 3 results) No results for input(s): PROBNP in the last 8760 hours.   D-Dimer No results for input(s): DDIMER in the last 72 hours. Hemoglobin A1C No results for input(s): HGBA1C in the last 72 hours. Fasting Lipid Panel No results for input(s): CHOL,  HDL, LDLCALC, TRIG, CHOLHDL, LDLDIRECT in the last 72 hours. Thyroid Function Tests No results for input(s): TSH, T4TOTAL, T3FREE, THYROIDAB in the last 72 hours.  Invalid input(s): FREET3  Other results:   Imaging    Dg Chest Port 1 View  Result Date: 01/10/2019 CLINICAL DATA:  Respiratory failure EXAM: PORTABLE CHEST 1 VIEW COMPARISON:  Yesterday FINDINGS: Endotracheal tube tip at the clavicular heads. The orogastric tube at least reaches the stomach. Right IJ line with tip at the SVC. Lung opacity at the bases with small left pleural effusion. No pneumothorax. Cardiomegaly. IMPRESSION: 1. Unremarkable hardware positioning. 2. Lower lobe atelectasis and pleural fluid based on recent chest CT. No change from yesterday. Electronically Signed   By: Monte Fantasia M.D.   On: 01/10/2019 06:55     Medications:     Scheduled Medications: . bisoprolol  2.5 mg Oral Daily  . chlorhexidine gluconate (MEDLINE KIT)  15 mL Mouth Rinse BID  . cloNIDine  0.2 mg Per Tube Q6H  . docusate  50 mg  Per Tube BID  . feeding supplement (PRO-STAT SUGAR FREE 64)  30 mL Per Tube QID  . feeding supplement (VITAL HIGH PROTEIN)  1,000 mL Per Tube Q24H  . free water  350 mL Per Tube Q6H  . furosemide  40 mg Intravenous Q6H  . hydrALAZINE  25 mg Per Tube Q8H  . insulin aspart  3-9 Units Subcutaneous Q4H  . insulin detemir  15 Units Subcutaneous Daily  . isosorbide dinitrate  10 mg Per Tube TID  . latanoprost  1 drop Both Eyes QHS  . mouth rinse  15 mL Mouth Rinse 10 times per day  . pantoprazole sodium  40 mg Per Tube BID  . potassium chloride  40 mEq Per Tube TID  . predniSONE  10 mg Per Tube Q breakfast    Infusions: . sodium chloride Stopped (01/10/19 0618)  . cefTAZidime (FORTAZ)  IV Stopped (01/10/19 0546)  . dexmedetomidine (PRECEDEX) IV infusion 0.4 mcg/kg/hr (01/10/19 0713)  . fentaNYL infusion INTRAVENOUS 100 mcg/hr (01/10/19 0700)  . heparin 1,000 Units/hr (01/10/19 0700)  . potassium chloride 10 mEq (01/10/19 0809)    PRN Medications: acetaminophen (TYLENOL) oral liquid 160 mg/5 mL, bisacodyl, fentaNYL (SUBLIMAZE) injection, ipratropium-albuterol, labetalol    Patient Profile   Nicholas Caldwell a 73 y.o.malePMH of chronic systolic CHF/nonischemic cardiomyopathy Echo 12/13/2016 LVEF 40-45%, COPD on home oxygen, PAF and history of non compliance.   Admitted with A/C respiratory failure and A/C systolic heart failure.  Assessment/Plan   1. Acute/chronic systolic CHF: Nonischemic cardiomyopathy based on cath in 2/20. Echo this admission with EF 45-50% but severe RV dilation/dysfunction. Severe COPD/OHS/OSAon home oxygen is a confounder and likely cause of RV failure/cor pulmonale. CVP higher at 17 today.  -Lasix ordered at 40 mg IV tid today.  -Continue hydralazine 25 mg tid with isordil 10 tid while intubated. - Continue bisoprolol 2.5 mg daily. - He is on clonidine as part of a clinical trial, titrate per protocol.  - Think he would be a good Cardiomems  candidateeventually. 2. Atrial fibrillation: Paroxysmal. He went into atrial fibrillation after intubation. On Xarelto at home. - Has not been felt to be a candidate for Tikosyn and amiodarone due to QT prolongation and severe lung disease. - Heparin gtt restarted given no further coffee grounds. - Keep NPO, plan TEE-guided DCCV in afternooon.  3. Pulmonary: Has COPD, wears home oxygen at baseline. Suspect COPD exacerbation with LLL PNA. WBCs 13.6, Tm 101.3 this  morning.  Now treating with ceftazidime for HCAP.  -Continue nebs and steroids per CCM. - On ceftazidime for HCAP.  4. Anemia: H/o GI bleeding. Hemoglobin stable in setting of coffee grounds emesis, now resolved.   - Continue PPI. - heparin gtt restarted.   5. OHS/OSA: Hypercarbic respiratory failure in setting of OHS/OSA and COPD. OSA diagnosed on sleep study, but having a lot of trouble getting CPAP from the [place]. Will need to see if we can get outside of [place]. I suspect that OHS/OSA plays a large role in his RV failure via hypoxic pulmonary vasoconstriction.  - Vent management per CCM, will need CPAP long-term. 6. Hypernatremia: Free water boluses ordered.   Loralie Champagne 01/10/2019 8:12 AM

## 2019-01-10 NOTE — Progress Notes (Signed)
Inpatient Diabetes Program Recommendations  AACE/ADA: New Consensus Statement on Inpatient Glycemic Control (2015)  Target Ranges:  Prepandial:   less than 140 mg/dL      Peak postprandial:   less than 180 mg/dL (1-2 hours)      Critically ill patients:  140 - 180 mg/dL   Results for Nicholas Caldwell, Nicholas Caldwell (MRN [id number]) as of 01/10/2019 10:25  Ref. Range 01/08/2019 23:40 01/09/2019 03:38 01/09/2019 07:02 01/09/2019 11:01 01/09/2019 15:04 01/09/2019 19:13  Glucose-Capillary Latest Ref Range: 70 - 99 mg/dL 144 (H)  3 units NOVOLOG  224 (H)  9 units NOVOLOG  151 (H)  6 units NOVOLOG +  15 units LEVEMIR given at 10am  217 (H)  9 units NOVOLOG  236 (H)  9 units NOVOLOG  219 (H)  9 units NOVOLOG    Results for Nicholas Caldwell, Nicholas Caldwell (MRN [id number]) as of 01/10/2019 10:25  Ref. Range 01/09/2019 23:35 01/10/2019 03:22 01/10/2019 07:03  Glucose-Capillary Latest Ref Range: 70 - 99 mg/dL 149 (H)  3 units NOVOLOG  216 (H)  9 units NOVOLOG  148 (H)  3 units NOVOLOG +  15 units LEVEMIR given at [place] DM Meds: 70/30 Insulin 30 units BID       Metformin 1000 mg BID   Current Orders: Levemir 15 units Daily      Novolog 3-6-9 units Q4 hours      Patient currently receiving Vital High Protein Tube Feeds at 50cc/hr.  Tube feeds on hold today for TEE.    MD- Once tube feeds are resumed post-procedure today, please consider starting Novolog tube feed coverage:  Novolog 3 units Q4 hours  HOLD if tube feeds HELD for any reason      --Will follow patient during hospitalization--  Wyn Quaker RN, MSN, CDE Diabetes Coordinator Inpatient Glycemic Control Team Team Pager: [phone] (8a-5p)

## 2019-01-10 NOTE — Progress Notes (Addendum)
NAME:  Nicholas Caldwell, MRN:  [id number], DOB:  09-08-1946, LOS: 25 ADMISSION DATE:  12/30/2018, CONSULTATION DATE:  3/3 REFERRING MD:  Florene Glen, CHIEF COMPLAINT:  Acute hypoxic and hypercarbic respiratory failure    Brief History   73 yo male presented with volume overload from acute on chronic biventricular CHF.  Developed progressive hypoxia, hypercapnia, and altered mental status.  Transferred to ICU 3/03 and intubated.  Third hospitalization since January 2020.  Past Medical History  Nonischemic cardiomyopathy, with left ventricular ejection fraction of 40 to 45%, chronic respiratory failure on nasal cannula at baseline,  Paroxysmal atrial fibrillation, sleep apnea, hypertension, prior noncompliance, HLD, s/p splenectomy  Significant Hospital Events   2/27: Admitted with decompensated heart failure 3/03: Critical care consulted, patient obtunded, more hypercarbic, worsening respiratory failure. 3/05: resume heparin gtt, fever, start ABx for HCAP 3/06: tried on pressure support >> a fib with RVR 3/08: doing better with pressure support  Consults:  Heart failure team 3/2  Procedures:  Endotracheal tube 3/3 >>   Significant Diagnostic Tests:  LHC/RHC 12/06/18 >> nonobstructive CAD, mod pulmonary HTN, PVR not significantly elevated CT chest 01/03/19 >> small/mod Rt and small Lt effusion, compressive ATX, CM  Micro Data:  Respiratory culture 3/3 >> oral flora  Antimicrobials:  Tressie Ellis 3/05 >>  Interim history/subjective:  No issues overnight, tolerating weaning  Objective   Blood pressure 122/70, pulse 75, temperature (!) 101.3 F (38.5 C), temperature source Axillary, resp. rate 19, height 6' (1.829 m), weight 113.3 kg, SpO2 99 %. CVP:  [2 mmHg-8 mmHg] 8 mmHg  Vent Mode: PRVC FiO2 (%):  [40 %] 40 % Set Rate:  [14 bmp] 14 bmp Vt Set:  [620 mL] 620 mL PEEP:  [5 cmH20] 5 cmH20 Pressure Support:  [8 DGU44-03 cmH20] 8 cmH20 Plateau Pressure:  [15 cmH20-22 cmH20] 22 cmH20    Intake/Output Summary (Last 24 hours) at 01/10/2019 0732 Last data filed at 01/10/2019 0700 Gross per 24 hour  Intake 2883.32 ml  Output 2120 ml  Net 763.32 ml   Filed Weights   01/08/19 0500 01/09/19 0346 01/10/19 0400  Weight: 111.9 kg 113.6 kg 113.3 kg    Examination:  General - Alert and interactive, moving all ext to command Eyes - PERRL, EOM-I and MMM ENT - ETT in place Cardiac - IRIR, Nl S1/S2 and -M/R/G Chest - CTA bilatearlly  Abdomen - Soft, NT, ND and +BS Extremities - -edema and -tenderness Skin - Intact Neuro - Alert and interactive, moving all ext to command  I reviewed CXR myself, ETT is in a good position and pulmonary edema noted   Resolved Hospital Problem list     Assessment & Plan:   Acute on chronic hypoxic/hypercapnic respiratory failure. OSA/OHS. COPD. Pleural effusions with atelectasis. Plan - Begin PS trials, no extubation until TEE and cardioversion are complete - Titrate O2 for sat of 88-92% - CXR and ABG in AM - Continue prednisone 10 mg PO daily - Continue active diureses - Will need Bipap after extubation >> might need to send him home with Trilogy home vent while arranging for outpt sleep testing  Fever 3/05 with concern for HCAP. Plan - Day 5/7 fortaz  Acute on chronic combined CHF with cor pulmonale. Paroxysmal A fib on xarelto as outpt >> a fib with RVR during SBT 3/06. HTN, HLD. Prolonged QTc. Plan - Maintain fluid even at this point - Lasix 40 mg IV q6 x3 doses - Free water 350 q6 - Continue clonidine, zebeta, hydralazine, isordil -  Continue heparin gtt - Heart failure team following, for a TEE and cardioversion today likely  Acute metabolic encephalopathy 2nd to respiratory failure. Plan - RASS goal 0 to -1  Gastritis. Plan - Changed protonix to 40 mg daily  DM type II with steroid induced hyperglycemia. Plan - SSI with levemir 15 units daily  Anemia of critical illness and chronic disease. Plan - F/u  CBC - Transfuse for Hb < 7  PCCM will continue to manage  Best practice:  Diet: tube feeds DVT prophylaxis: heparin gtt GI prophylaxis: Protonix Mobility: Bedrest Code Status: Full code Family Communication: updated wife at bedside 3/07 Disposition: ICU  Labs    CMP Latest Ref Rng & Units 01/10/2019 01/09/2019 01/09/2019  Glucose 70 - 99 mg/dL 248(H) 154(H) 202(H)  BUN 8 - 23 mg/dL 70(H) 69(H) 61(H)  Creatinine 0.61 - 1.24 mg/dL 1.24 1.22 1.17  Sodium 135 - 145 mmol/L 156(H) 158(H) 152(H)  Potassium 3.5 - 5.1 mmol/L 3.4(L) 3.3(L) 2.5(LL)  Chloride 98 - 111 mmol/L 113(H) 114(H) 106  CO2 22 - 32 mmol/L 35(H) 37(H) 35(H)  Calcium 8.9 - 10.3 mg/dL 9.2 9.2 8.8(L)  Total Protein 6.5 - 8.1 g/dL - - -  Total Bilirubin 0.3 - 1.2 mg/dL - - -  Alkaline Phos 38 - 126 U/L - - -  AST 15 - 41 U/L - - -  ALT 0 - 44 U/L - - -   CBC Latest Ref Rng & Units 01/10/2019 01/09/2019 01/08/2019  WBC 4.0 - 10.5 K/uL 15.2(H) 13.6(H) 14.8(H)  Hemoglobin 13.0 - 17.0 g/dL 9.0(L) 9.1(L) 9.2(L)  Hematocrit 39.0 - 52.0 % 31.7(L) 32.1(L) 31.4(L)  Platelets 150 - 400 K/uL 359 377 386   ABG    Component Value Date/Time   PHART 7.402 01/06/2019 0300   PCO2ART 51.7 (H) 01/06/2019 0300   PO2ART 83.5 01/06/2019 0300   HCO3 31.4 (H) 01/06/2019 0300   TCO2 47 (H) 01/04/2019 1710   ACIDBASEDEF 2.0 02/19/2015 1003   O2SAT 64.0 01/10/2019 0400   CBG (last 3)  Recent Labs    01/09/19 2335 01/10/19 0322 01/10/19 0703  GLUCAP 149* 216* 148*   The patient is critically ill with multiple organ systems failure and requires high complexity decision making for assessment and support, frequent evaluation and titration of therapies, application of advanced monitoring technologies and extensive interpretation of multiple databases.   Critical Care Time devoted to patient care services described in this note is  32  Minutes. This time reflects time of care of this signee Dr Jennet Maduro. This critical care time does not  reflect procedure time, or teaching time or supervisory time of PA/NP/Med student/Med Resident etc but could involve care discussion time.  Rush Farmer, M.D. [facility] Pulmonary/Critical Care Medicine. Pager: [phone]. After hours pager: [phone].

## 2019-01-10 NOTE — CV Procedure (Signed)
Procedure: TEE  Indication: Atrial fibrillation  Sedation: Versed gtt at 2 mg/hr, Fentanyl gtt at 100 mcg/hr.  Patient received a total of 7 mg Versed boluses for TEE and DCCV.   Findings:  Please see echo section for full report.  Normal LV size with moderate LV hypertrophy.  EF 40-45%, diffuse hypokinesis.  The RV appeared severely dilated with moderately decreased systolic function. Mild right atrial enlargement.  Normal left atrial size, no LA appendage thrombus.  Mild TR, peak RV-RA gradient 25 mmHg.  Trivial MR.  The aortic valve was trileaflet with mild aortic insufficiency, no aortic stenosis.  There was no ASD or PFO by color doppler.  Normal caliber thoracic aorta with mild plaque.   Impression: May proceed to DCCV.   Nicholas Caldwell 01/10/2019 2:15 PM

## 2019-01-10 NOTE — Progress Notes (Deleted)
ANTICOAGULATION CONSULT NOTE   Pharmacy Consult for heparin Indication: atrial fibrillation  No Known Allergies  Patient Measurements: Height: 6' (182.9 cm) Weight: 249 lb 12.5 oz (113.3 kg) IBW/kg (Calculated) : 77.6 Heparin Dosing Weight: 107 kg  Vital Signs: Temp: 99.9 F (37.7 C) (03/09 1107) Temp Source: Axillary (03/09 1107) BP: 124/66 (03/09 1158) Pulse Rate: 80 (03/09 1000)  Labs: Recent Labs    01/08/19 0323 01/09/19 0434 01/09/19 0942 01/09/19 1845 01/09/19 2240 01/10/19 0403 01/10/19 1203  HGB 9.2* 9.1*  --   --   --  9.0*  --   HCT 31.4* 32.1*  --   --   --  31.7*  --   PLT 386 377  --   --   --  359  --   APTT 71* 83*  --   --   --  57* 75*  HEPARINUNFRC 0.66 0.80*  --  0.89*  --  0.63  --   CREATININE 1.13 1.25* 1.17  --  1.22 1.24  --     Estimated Creatinine Clearance: 72.1 mL/min (by C-G formula based on SCr of 1.24 mg/dL).  Assessment: 39 yom admitted with respiratory and systolic heart failure, on Xarelto PTA for Afib (last dose on 3/2_0 ). Presented with AMS with inability to protect his airway - now intubated and transferred to 88M.   Was transitioned to heparin gtt on 3/3 but held on 3/4 due to concerns for GI bleed.   APTT and HL remain discordant despite the fact that the last dose of rivaroxaban was on 3/2. Continue to follow aPTT which is now therapeutic.   Goal of Therapy:  Heparin level 0.3-0.5 units/ml - will aim for a lower goal due to inc risk of bleed APTT 66-85s Monitor platelets by anticoagulation protocol: Yes   Plan:  Continue IV heparin at 1000 units/hr Daily aPTT/HL, cbc  Albertina Parr, PharmD., BCPS Clinical Pharmacist Clinical phone for 01/10/19 until 3:30pm: [phone] If after 3:30pm, please refer to [facility] for unit-specific pharmacist

## 2019-01-10 NOTE — Progress Notes (Addendum)
ANTICOAGULATION CONSULT NOTE   Pharmacy Consult for heparin Indication: atrial fibrillation  No Known Allergies  Patient Measurements: Height: 6' (182.9 cm) Weight: 249 lb 12.5 oz (113.3 kg) IBW/kg (Calculated) : 77.6 Heparin Dosing Weight: 107 kg  Vital Signs: Temp: 99.9 F (37.7 C) (03/09 1107) Temp Source: Axillary (03/09 1107) BP: 124/66 (03/09 1158) Pulse Rate: 80 (03/09 1000)  Labs: Recent Labs    01/08/19 0323 01/09/19 0434 01/09/19 0942 01/09/19 1845 01/09/19 2240 01/10/19 0403 01/10/19 1203  HGB 9.2* 9.1*  --   --   --  9.0*  --   HCT 31.4* 32.1*  --   --   --  31.7*  --   PLT 386 377  --   --   --  359  --   APTT 71* 83*  --   --   --  57* 75*  HEPARINUNFRC 0.66 0.80*  --  0.89*  --  0.63  --   CREATININE 1.13 1.25* 1.17  --  1.22 1.24  --     Estimated Creatinine Clearance: 72.1 mL/min (by C-G formula based on SCr of 1.24 mg/dL).  Assessment: 36 yom admitted with respiratory and systolic heart failure, on Xarelto PTA for Afib (last dose on 3/2_0 ). Presented with AMS with inability to protect his airway - now intubated and transferred to 51M.   Was transitioned to heparin gtt on 3/3 but held on 3/4 due to concerns for GI bleed.   Heparin levels likely still somewhat elevated from recent Xarelto use.  APTT now within goal range.  No overt bleeding or complications noted.  Planned DCCV today.  Goal of Therapy:  Heparin level 0.3-0.5 units/ml - will aim for a lower goal due to inc risk of bleed APTT 66- ~ 85 or so Monitor platelets by anticoagulation protocol: Yes   Plan:  Continue IV Heparin at 1000 units/hr Daily APTT, heparin level, CBC. F/u plans to resume oral anticoagulation as able.  Marguerite Olea, [facility] Clinical Pharmacist Phone [phone]  01/10/2019 1:18 PM

## 2019-01-10 NOTE — Progress Notes (Signed)
  Echocardiogram Echocardiogram Transesophageal has been performed.  Darlina Sicilian M 01/10/2019, 2:45 PM

## 2019-01-10 NOTE — Progress Notes (Addendum)
ANTICOAGULATION CONSULT NOTE - Follow Up Consult  Pharmacy Consult for heparin Indication: atrial fibrillation  Labs: Recent Labs    01/08/19 0323 01/09/19 0434 01/09/19 0942 01/09/19 1845 01/09/19 2240 01/10/19 0403  HGB 9.2* 9.1*  --   --   --  9.0*  HCT 31.4* 32.1*  --   --   --  31.7*  PLT 386 377  --   --   --  359  APTT 71* 83*  --   --   --  57*  HEPARINUNFRC 0.66 0.80*  --  0.89*  --  0.63  CREATININE 1.13 1.25* 1.17  --  1.22 1.24    Assessment: 73yo male now subtherapeutic on heparin after rate changes; the effects of Xarelto on anti-Xa have been prolonged in this pt and will need aPTT monitoring since aPTT is below goal even though heparin levels appear to be elevated.  Goal of Therapy:  aPTT 66-85 seconds   Plan:  Will increase heparin gtt by ~1 unit/kg/hr to 1000 units/hr and check PTT in 6 hours.    Wynona Neat, PharmD, BCPS  01/10/2019,6:19 AM

## 2019-01-10 NOTE — Procedures (Signed)
Electrical Cardioversion Procedure Note Nicholas Caldwell [id number] [birth year]  Procedure: Electrical Cardioversion Indications:  Atrial Fibrillation  Procedure Details Consent: Risks of procedure as well as the alternatives and risks of each were explained to the (patient/caregiver).  Consent for procedure obtained. Time Out: Verified patient identification, verified procedure, site/side was marked, verified correct patient position, special equipment/implants available, medications/allergies/relevent history reviewed, required imaging and test results available.  Performed  Patient placed on cardiac monitor, pulse oximetry, supplemental oxygen as necessary.  Sedation given: Versed/Fentanyl Pacer pads placed anterior and posterior chest.  Cardioverted 1 time(s).  Cardioverted at [place].  Evaluation Findings: Post procedure EKG shows: NSR Complications: None Patient did tolerate procedure well.   Loralie Champagne 01/10/2019, 2:15 PM

## 2019-01-11 ENCOUNTER — Inpatient Hospital Stay (HOSPITAL_COMMUNITY): Payer: Medicare Other

## 2019-01-11 DIAGNOSIS — R0902 Hypoxemia: Secondary | ICD-10-CM

## 2019-01-11 LAB — BASIC METABOLIC PANEL
Anion gap: 10 (ref 5–15)
BUN: 85 mg/dL — ABNORMAL HIGH (ref 8–23)
CHLORIDE: 115 mmol/L — AB (ref 98–111)
CO2: 34 mmol/L — ABNORMAL HIGH (ref 22–32)
Calcium: 9.1 mg/dL (ref 8.9–10.3)
Creatinine, Ser: 1.51 mg/dL — ABNORMAL HIGH (ref 0.61–1.24)
GFR calc Af Amer: 53 mL/min — ABNORMAL LOW (ref >60)
GFR calc non Af Amer: 46 mL/min — ABNORMAL LOW (ref >60)
Glucose, Bld: 200 mg/dL — ABNORMAL HIGH (ref 70–99)
Potassium: 3.9 mmol/L (ref 3.5–5.1)
Sodium: 159 mmol/L — ABNORMAL HIGH (ref 135–145)

## 2019-01-11 LAB — POCT I-STAT 7, (LYTES, BLD GAS, ICA,H+H)
Acid-Base Excess: 11 mmol/L — ABNORMAL HIGH (ref 0.0–2.0)
Bicarbonate: 36 mmol/L — ABNORMAL HIGH (ref 20.0–28.0)
Calcium, Ion: 1.24 mmol/L (ref 1.15–1.40)
HCT: 29 % — ABNORMAL LOW (ref 39.0–52.0)
Hemoglobin: 9.9 g/dL — ABNORMAL LOW (ref 13.0–17.0)
O2 Saturation: 98 %
Patient temperature: 98
Potassium: 3.8 mmol/L (ref 3.5–5.1)
Sodium: 160 mmol/L — ABNORMAL HIGH (ref 135–145)
TCO2: 38 mmol/L — ABNORMAL HIGH (ref 22–32)
pCO2 arterial: 48.5 mmHg — ABNORMAL HIGH (ref 32.0–48.0)
pH, Arterial: 7.478 — ABNORMAL HIGH (ref 7.350–7.450)
pO2, Arterial: 102 mmHg (ref 83.0–108.0)

## 2019-01-11 LAB — CBC
HCT: 30.1 % — ABNORMAL LOW (ref 39.0–52.0)
HEMOGLOBIN: 8.4 g/dL — AB (ref 13.0–17.0)
MCH: 25.5 pg — ABNORMAL LOW (ref 26.0–34.0)
MCHC: 27.9 g/dL — ABNORMAL LOW (ref 30.0–36.0)
MCV: 91.5 fL (ref 80.0–100.0)
Platelets: 346 10*3/uL (ref 150–400)
RBC: 3.29 MIL/uL — ABNORMAL LOW (ref 4.22–5.81)
RDW: 19.6 % — ABNORMAL HIGH (ref 11.5–15.5)
WBC: 15.6 10*3/uL — ABNORMAL HIGH (ref 4.0–10.5)
nRBC: 0 % (ref 0.0–0.2)

## 2019-01-11 LAB — PHOSPHORUS: Phosphorus: 3.2 mg/dL (ref 2.5–4.6)

## 2019-01-11 LAB — APTT: aPTT: 68 seconds — ABNORMAL HIGH (ref 24–36)

## 2019-01-11 LAB — MAGNESIUM: Magnesium: 2.5 mg/dL — ABNORMAL HIGH (ref 1.7–2.4)

## 2019-01-11 LAB — HEPARIN LEVEL (UNFRACTIONATED): Heparin Unfractionated: 0.81 IU/mL — ABNORMAL HIGH (ref 0.30–0.70)

## 2019-01-11 LAB — GLUCOSE, CAPILLARY
GLUCOSE-CAPILLARY: 146 mg/dL — AB (ref 70–99)
Glucose-Capillary: 173 mg/dL — ABNORMAL HIGH (ref 70–99)
Glucose-Capillary: 194 mg/dL — ABNORMAL HIGH (ref 70–99)
Glucose-Capillary: 248 mg/dL — ABNORMAL HIGH (ref 70–99)
Glucose-Capillary: 164 mg/dL — ABNORMAL HIGH (ref 70–99)

## 2019-01-11 MED ORDER — DEXTROSE 5 % IV SOLN
INTRAVENOUS | Status: DC
  Administered 2019-01-11: 950 mL via INTRAVENOUS
  Administered 2019-01-12 (×2): via INTRAVENOUS

## 2019-01-11 MED ORDER — ORAL CARE MOUTH RINSE
15.0000 mL | Freq: Two times a day (BID) | OROMUCOSAL | Status: DC
  Administered 2019-01-11 – 2019-01-14 (×3): 15 mL via OROMUCOSAL

## 2019-01-11 NOTE — Procedures (Signed)
Extubation Procedure Note  Patient Details:   Name: Nicholas Caldwell DOB: 11/06/1945 MRN: [id number]   Airway Documentation:    Vent end date: 01/11/19 Vent end time: 0900   Evaluation  O2 sats: stable throughout Complications: No apparent complications Patient did tolerate procedure well. Bilateral Breath Sounds: Diminished, Clear   Yes   Patient extubated to 4L [name] without complications. Positive cuff leak noted. RN at bedside. Vitals are stable.   Herbie Baltimore 01/11/2019, 9:39 AM

## 2019-01-11 NOTE — Evaluation (Signed)
Clinical/Bedside Swallow Evaluation Patient Details  Name: Nicholas Caldwell MRN: [id number] Date of Birth: 1946/03/26  Today's Date: 01/11/2019 Time: SLP Start Time (ACUTE ONLY): 35 SLP Stop Time (ACUTE ONLY): 1600 SLP Time Calculation (min) (ACUTE ONLY): 20 min  Past Medical History:  Past Medical History:  Diagnosis Date  . Atrial flutter ([place])    a. recurrent AFlutter with RVR;  b. Amiodarone Rx started 4/16  . CAD (coronary artery disease)    a. LHC 1/16:  mLAD diffuse disease, pLCx mild disease, dLCx with disease but too small for PCI, RCA ok, EF 25-30%  . Chronic pain   . Chronic systolic CHF (congestive heart failure) ([place])   . COPD (chronic obstructive pulmonary disease) ([place])   . Diabetes mellitus without complication ([place])   . Hypercholesteremia   . Hypertension   . NICM (nonischemic cardiomyopathy) ([place])    a.dx 2016. b. 2D echo 06/2016 - Last echo 07/01/16: mod dilated LV, mod LVH, EF 25-30%, mild-mod MR, sev LAE, mild-mod reduced RV systolic function, mild-mod TR, PASP 12mHG.  .Marland KitchenPAF (paroxysmal atrial fibrillation) (H[facility])    On amio - ot a candidate for flecainide due to cardiomyopathy, not a candidate for Tikosyn due to prolonged QT, and felt to be a poor candidate for ablation given left atrial size.  . [place]monary hypertension ([place]   . Tobacco abuse    Past Surgical History:  Past Surgical History:  Procedure Laterality Date  . CARDIOVERSION N/A 07/30/2018   Procedure: CARDIOVERSION;  Surgeon: MLarey Dresser MD;  [facility];  Service: Cardiovascular;  Laterality: N/A;  . LEFT AND RIGHT HEART CATHETERIZATION WITH CORONARY ANGIOGRAM N/A 11/06/2014   Procedure: LEFT AND RIGHT HEART CATHETERIZATION WITH CORONARY ANGIOGRAM;  Surgeon: JJettie Booze MD;  [facility];  Service: Cardiovascular;  Laterality: N/A;  . RIGHT/LEFT HEART CATH AND CORONARY ANGIOGRAPHY N/A 12/06/2018   Procedure: RIGHT/LEFT HEART CATH AND CORONARY ANGIOGRAPHY;  Surgeon: MLarey Dresser MD;  [facility];  Service: Cardiovascular;  Laterality: N/A;  . SPLENECTOMY     HPI:  73yo male presented with volume overload from acute on chronic biventricular CHF.  Developed progressive hypoxia, hypercapnia, and altered mental status.  Transferred to ICU intubated 01/04/19 (ETT 7 days).  Third hospitalization since January 2020.   Assessment / Plan / Recommendation Clinical Impression    Pt was pleasant and engaged throughout bedside swallow evaluation today. He denied any pharyngeal soreness at rest or during swallow. Volitional cough was strong but congested; vocal quality was strong. Pt accepted several sips of thin H2O, puree, and regular texture solids without any overt s/s aspiration or reported difficulties. SLP educated pt regarding importance of using added precaution during meals, given prolonged intubation and deconditioning. Recommend regular diet, thin liquids, meds whole with liquid and intermittent supervision to ensure small bites/sips and slow rate. ST will follow up with pt while in acute setting to provide skilled treatment with diet safety and efficiency.   SLP Visit Diagnosis: Dysphagia, unspecified (R13.10)    Aspiration Risk  Mild aspiration risk    Diet Recommendation Regular;Thin liquid   Liquid Administration via: Cup;Straw Medication Administration: Whole meds with liquid Supervision: Patient able to self feed;Intermittent supervision to cue for compensatory strategies Compensations: Slow rate;Small sips/bites;Minimize environmental distractions Postural Changes: Seated upright at 90 degrees    Other  Recommendations Oral Care Recommendations: Oral care BID   Follow up Recommendations 24 hour supervision/assistance      Frequency and  Duration min 1 x/week  2 weeks       Prognosis Prognosis for Safe Diet Advancement: Good      Swallow Study   General HPI: 73 yo male presented with volume overload from acute on chronic  biventricular CHF.  Developed progressive hypoxia, hypercapnia, and altered mental status.  Transferred to ICU intubated 01/04/19 (ETT 7 days).  Third hospitalization since January 2020. Type of Study: Bedside Swallow Evaluation Previous Swallow Assessment: none found Diet Prior to this Study: NPO Temperature Spikes Noted: No Respiratory Status: Nasal cannula History of Recent Intubation: Yes Length of Intubations (days): 7 days Date extubated: 01/11/19 Behavior/Cognition: Alert;Cooperative;Pleasant mood Oral Cavity Assessment: Within Functional Limits Oral Care Completed by SLP: Recent completion by staff Oral Cavity - Dentition: Dentures, top;Dentures, bottom Vision: Functional for self-feeding Self-Feeding Abilities: Able to feed self Patient Positioning: Upright in bed Baseline Vocal Quality: Normal Volitional Cough: Congested;Strong Volitional Swallow: Able to elicit    Oral/Motor/Sensory Function Overall Oral Motor/Sensory Function: Within functional limits   Ice Chips Ice chips: Not tested   Thin Liquid Thin Liquid: Within functional limits Presentation: Cup;Self Fed    Nectar Thick Nectar Thick Liquid: Not tested   Honey Thick Honey Thick Liquid: Not tested   Puree Puree: Within functional limits Presentation: Self Fed;Spoon   Solid    Jettie Booze, Student SLP  Solid: Within functional limits Presentation: Self Fed      Jettie Booze 01/11/2019,4:06 PM

## 2019-01-11 NOTE — Progress Notes (Signed)
ANTICOAGULATION CONSULT NOTE   Pharmacy Consult for heparin Indication: atrial fibrillation  No Known Allergies  Patient Measurements: Height: 6' (182.9 cm) Weight: 250 lb 10.6 oz (113.7 kg) IBW/kg (Calculated) : 77.6 Heparin Dosing Weight: 107 kg  Vital Signs: Temp: 100.4 F (38 C) (03/10 0806) Temp Source: Oral (03/10 0806) BP: 116/59 (03/10 0831) Pulse Rate: 62 (03/10 0831)  Labs: Recent Labs    01/09/19 0434  01/09/19 1845 01/09/19 2240 01/10/19 0403 01/10/19 1203 01/11/19 0409 01/11/19 0440  HGB 9.1*  --   --   --  9.0*  --  8.4* 9.9*  HCT 32.1*  --   --   --  31.7*  --  30.1* 29.0*  PLT 377  --   --   --  359  --  346  --   APTT 83*  --   --   --  57* 75* 68*  --   HEPARINUNFRC 0.80*  --  0.89*  --  0.63  --  0.81*  --   CREATININE 1.25*   < >  --  1.22 1.24  --  1.51*  --    < > = values in this interval not displayed.    Estimated Creatinine Clearance: 59.2 mL/min (A) (by C-G formula based on SCr of 1.51 mg/dL (H)).  Assessment: 75 yom admitted with respiratory and systolic heart failure, on Xarelto PTA for Afib (last dose on 3/2_0 ). Presented with AMS with inability to protect his airway - now intubated and transferred to 53M.   Was transitioned to heparin gtt on 3/3 but held on 3/4 due to concerns for GI bleed.   Heparin level likely still somewhat elevated from recent Xarelto use.  APTT remains within goal range.  No overt bleeding or complications noted.  Patient underwent successful DCCV yesterday and was extubated today.  Goal of Therapy:  Heparin level 0.3-0.5 units/ml - will aim for a lower goal due to inc risk of bleed APTT 66- ~ 85 or so Monitor platelets by anticoagulation protocol: Yes   Plan:  Continue IV Heparin at 1000 units/hr Daily APTT, heparin level, CBC. F/u plans to resume oral anticoagulation as able.  Jackson Latino, PharmD PGY1 Pharmacy Resident Phone [phone] 01/11/2019     8:38 AM

## 2019-01-11 NOTE — Progress Notes (Addendum)
Patient ID: Nicholas Caldwell, male   DOB: 1946/03/23, 73 y.o.   MRN: [id number]     Advanced Heart Failure Rounding Note  PCP-Cardiologist: No primary care provider on file.   Subjective:    Tmax 100.4. CVP 5-6 cm   Underwent TEE/DCCV yesterday.   Awake on vent. Maintaining NSR since DCCV yesterday. Occasional PVCs.   CXR with left base infiltrate.   Objective:   Weight Range: 113.7 kg Body mass index is 34 kg/m.   Vital Signs:   Temp:  [99.2 F (37.3 C)-100.4 F (38 C)] 100.4 F (38 C) (03/10 0806) Pulse Rate:  [60-90] 66 (03/10 0800) Resp:  [11-24] 21 (03/10 0800) BP: (88-138)/(50-78) 116/59 (03/10 0800) SpO2:  [93 %-100 %] 98 % (03/10 0800) FiO2 (%):  [40 %] 40 % (03/10 0600) Weight:  [113.7 kg] 113.7 kg (03/10 0500) Last BM Date: 01/01/19  Weight change: Filed Weights   01/09/19 0346 01/10/19 0400 01/11/19 0500  Weight: 113.6 kg 113.3 kg 113.7 kg    Intake/Output:   Intake/Output Summary (Last 24 hours) at 01/11/2019 0818 Last data filed at 01/11/2019 0800 Gross per 24 hour  Intake 2097.02 ml  Output 2080 ml  Net 17.02 ml     Physical Exam  CVP 5-6 cm  General: Intubated/sedated.  HEENT: + ETT Neck: Supple. JVP 5-6. Carotids 2+ bilat; no bruits. No thyromegaly or nodule noted. Cor: PMI nondisplaced. RRR, No M/G/R noted Lungs: Diminished basilar sounds Abdomen: Soft, non-tender, non-distended, no HSM. No bruits or masses. +BS  Extremities: No cyanosis, clubbing, or rash. Trace ankle edema.  Neuro: Intubated/sedated   Telemetry   NSR 60-70s, occasional PVCs, personally reviewed.   EKG    No new tracings.    Labs    CBC Recent Labs    01/10/19 0403 01/11/19 0409 01/11/19 0440  WBC 15.2* 15.6*  --   HGB 9.0* 8.4* 9.9*  HCT 31.7* 30.1* 29.0*  MCV 90.8 91.5  --   PLT 359 346  --    Basic Metabolic Panel Recent Labs    01/10/19 0403 01/11/19 0409 01/11/19 0440  NA 156* 159* 160*  K 3.4* 3.9 3.8  CL 113* 115*  --   CO2 35* 34*  --     GLUCOSE 248* 200*  --   BUN 70* 85*  --   CREATININE 1.24 1.51*  --   CALCIUM 9.2 9.1  --   MG 2.6* 2.5*  --   PHOS  --  3.2  --    Liver Function Tests No results for input(s): AST, ALT, ALKPHOS, BILITOT, PROT, ALBUMIN in the last 72 hours. No results for input(s): LIPASE, AMYLASE in the last 72 hours. Cardiac Enzymes No results for input(s): CKTOTAL, CKMB, CKMBINDEX, TROPONINI in the last 72 hours.  BNP: BNP (last 3 results) Recent Labs    11/21/18 2238 12/01/18 0015 12/30/18 1621  BNP 1,739.7* 1,666.7* 1,273.0*    ProBNP (last 3 results) No results for input(s): PROBNP in the last 8760 hours.   D-Dimer No results for input(s): DDIMER in the last 72 hours. Hemoglobin A1C No results for input(s): HGBA1C in the last 72 hours. Fasting Lipid Panel No results for input(s): CHOL, HDL, LDLCALC, TRIG, CHOLHDL, LDLDIRECT in the last 72 hours. Thyroid Function Tests No results for input(s): TSH, T4TOTAL, T3FREE, THYROIDAB in the last 72 hours.  Invalid input(s): FREET3  Other results:   Imaging    Dg Chest Port 1 View  Result Date: 01/11/2019 CLINICAL DATA:  Follow-up  endotracheal tube EXAM: PORTABLE CHEST 1 VIEW COMPARISON:  01/10/2019 FINDINGS: Endotracheal tube, gastric catheter and right jugular central line are again seen and stable. Cardiac shadow remains enlarged. Persistent left basilar opacity and effusion is seen. No new focal infiltrate on the right is noted. IMPRESSION: Stable appearance of left basilar infiltrate with effusion. Tubes and lines as described. Electronically Signed   By: Inez Catalina M.D.   On: 01/11/2019 05:47     Medications:     Scheduled Medications: . bisoprolol  2.5 mg Oral Daily  . chlorhexidine gluconate (MEDLINE KIT)  15 mL Mouth Rinse BID  . cloNIDine  0.2 mg Per Tube Q6H  . docusate  50 mg Per Tube BID  . feeding supplement (PRO-STAT SUGAR FREE 64)  30 mL Per Tube QID  . feeding supplement (VITAL HIGH PROTEIN)  1,000 mL Per  Tube Q24H  . free water  350 mL Per Tube Q6H  . hydrALAZINE  25 mg Per Tube Q8H  . insulin aspart  3-9 Units Subcutaneous Q4H  . insulin detemir  15 Units Subcutaneous Daily  . isosorbide dinitrate  10 mg Per Tube TID  . latanoprost  1 drop Both Eyes QHS  . mouth rinse  15 mL Mouth Rinse 10 times per day  . pantoprazole sodium  40 mg Per Tube BID  . predniSONE  10 mg Per Tube Q breakfast  . sodium chloride flush  3 mL Intravenous Q12H    Infusions: . sodium chloride 10 mL/hr at 01/11/19 0800  . sodium chloride Stopped (01/10/19 1414)  . sodium chloride    . cefTAZidime (FORTAZ)  IV Stopped (01/11/19 0544)  . dexmedetomidine (PRECEDEX) IV infusion Stopped (01/11/19 0718)  . fentaNYL infusion INTRAVENOUS Stopped (01/11/19 0718)  . heparin 1,000 Units/hr (01/11/19 0800)  . midazolam Stopped (01/10/19 1415)    PRN Medications: acetaminophen (TYLENOL) oral liquid 160 mg/5 mL, bisacodyl, fentaNYL (SUBLIMAZE) injection, ipratropium-albuterol, labetalol, sodium chloride flush    Patient Profile   Nicholas Caldwell a 73 y.o.malePMH of chronic systolic CHF/nonischemic cardiomyopathy Echo 12/13/2016 LVEF 40-45%, COPD on home oxygen, PAF and history of non compliance.   Admitted with A/C respiratory failure and A/C systolic heart failure.  Assessment/Plan   1. Acute/chronic systolic CHF: Nonischemic cardiomyopathy based on cath in 2/20. Echo this admission with EF 45-50% but severe RV dilation/dysfunction. Severe COPD/OHS/OSAon home oxygen is a confounder and likely cause of RV failure/cor pulmonale. TEE with EF 40-45%, moderate LVH, severe RV dilation with moderately decreased systolic function.  CVP down to 6-7.  - Hold diuretics.  -Continue hydralazine 25 mg TID with isordil 10 TID while intubated. - Continue bisoprolol 2.5 mg daily. - He is on clonidine as part of a clinical trial, titrate per protocol.  - Think he would be a good Cardiomems candidateeventually. 2. Atrial  fibrillation: Paroxysmal. He went into atrial fibrillation after intubation. On Xarelto at home. - Has not been felt to be a candidate for Tikosyn and amiodarone due to QT prolongation and severe lung disease. - Heparin gtt restarted given no further coffee grounds. - Holding NSR s/p TEE/DCCV.  3. Pulmonary: Has COPD, wears home oxygen at baseline. Suspect COPD exacerbation with LLL PNA. WBCs 15.6. Tmax 100.4.  Now treating with ceftazidime for HCAP.  -Continue nebs and steroids per CCM. - On ceftazidime for HCAP.  - Plan to extubate today.  4. Anemia: H/o GI bleeding. Hemoglobin stable in setting of coffee grounds emesis, now resolved.   - Continue PPI. - Heparin  gtt restarted.   5. OHS/OSA: Hypercarbic respiratory failure in setting of OHS/OSA and COPD. OSA diagnosed on sleep study, but having a lot of trouble getting CPAP from the [place]. Will need to see if we can get outside of [place]. Suspect that OHS/OSA plays a large role in his RV failure via hypoxic pulmonary vasoconstriction.  - Vent management per CCM, will need CPAP long-term. 6. Hypernatremia:  - Na 160 this am.  - Free water boluses ordered, to be extubated and will be able to take po hydration.  7. AKI on CKD III - CVP 1.2 -> 1.5 - Stop lasix.   Maintaining NSR. May be able to wean vent today.   Shirley Friar, PA-C  01/11/2019 8:18 AM   Advanced Heart Failure Team Pager [phone] (M-F; 7a - 4p)  Please contact [facility] for night-coverage after hours (4p -7a ) and weekends on [email or web address]  Patient seen with PA, agree with the above note.    He is maintaining NSR after DCCV yesterday.  CVP 5-6, BUN/creatinine rising. Awake on vent.  Worsening hypernatremia.   On exam, awake on vent.  No JVD.  No edema.  Regular S1S2.  Decreased BS bilaterally.   Plan to extubate today.  Hold Lasix with low CVP and rising BUN/creatinine.    He remains in NSR,on heparin gtt currently, can restart Xarelto after  extubation and taking po again.   He is on free water boluses, hopefully hypernatremia will improve when extubated and taking po.   Tm 100.4 with PNA, ceftazidime continues.   Loralie Champagne 01/11/2019 8:38 AM

## 2019-01-11 NOTE — Progress Notes (Signed)
NAME:  Nicholas Caldwell, MRN:  [id number], DOB:  08-08-1946, LOS: 12 ADMISSION DATE:  12/30/2018, CONSULTATION DATE:  3/3 REFERRING MD:  Florene Glen, CHIEF COMPLAINT:  Acute hypoxic and hypercarbic respiratory failure    Brief History   73 yo male presented with volume overload from acute on chronic biventricular CHF.  Developed progressive hypoxia, hypercapnia, and altered mental status.  Transferred to ICU 3/03 and intubated.  Third hospitalization since January 2020.  Past Medical History  Nonischemic cardiomyopathy, with left ventricular ejection fraction of 40 to 45%, chronic respiratory failure on nasal cannula at baseline,  Paroxysmal atrial fibrillation, sleep apnea, hypertension, prior noncompliance, HLD, s/p splenectomy  Significant Hospital Events   2/27: Admitted with decompensated heart failure 3/03: Critical care consulted, patient obtunded, more hypercarbic, worsening respiratory failure. 3/05: resume heparin gtt, fever, start ABx for HCAP 3/06: tried on pressure support >> a fib with RVR 3/08: doing better with pressure support  Consults:  Heart failure team 3/2  Procedures:  Endotracheal tube 3/3 >>   Significant Diagnostic Tests:  LHC/RHC 12/06/18 >> nonobstructive CAD, mod pulmonary HTN, PVR not significantly elevated CT chest 01/03/19 >> small/mod Rt and small Lt effusion, compressive ATX, CM  Micro Data:  Respiratory culture 3/3 >> oral flora  Antimicrobials:  Tressie Ellis 3/05 >>  Interim history/subjective:  Cardioverted and doing well this AM No events, no new complaints  Objective   Blood pressure (!) 116/59, pulse 66, temperature (!) 100.4 F (38 C), temperature source Oral, resp. rate (!) 21, height 6' (1.829 m), weight 113.7 kg, SpO2 98 %. CVP:  [6 mmHg-10 mmHg] 6 mmHg  Vent Mode: PRVC FiO2 (%):  [40 %] 40 % Set Rate:  [14 bmp] 14 bmp Vt Set:  [620 mL] 620 mL PEEP:  [5 cmH20] 5 cmH20 Plateau Pressure:  [21 cmH20-27 cmH20] 21 cmH20   Intake/Output Summary  (Last 24 hours) at 01/11/2019 0829 Last data filed at 01/11/2019 0800 Gross per 24 hour  Intake 2097.02 ml  Output 2080 ml  Net 17.02 ml   Filed Weights   01/09/19 0346 01/10/19 0400 01/11/19 0500  Weight: 113.6 kg 113.3 kg 113.7 kg    Examination:  General - Alert and interactive, well appearing Eyes - PERRL, EOM-I and MMM ENT - ETT in place Cardiac - IRIR, Nl S1/S2 and -M/R/G Chest - CTA bilaterally, weaning Abdomen - Soft, NT, ND and +BS Extremities - -edema and -tenderness Skin - Intact Neuro - Alert and interactive, moving all ext to command  I reviewed CXR myself, ETT is in good position  Resolved Hospital Problem list     Assessment & Plan:   Acute on chronic hypoxic/hypercapnic respiratory failure. OSA/OHS. COPD. Pleural effusions with atelectasis. Plan - Wean to extubate today - Titrate O2 for sat of 88-92% - Continue prednisone 10 mg PO daily - Diureses per cards at this point - CPAP at night - SLP - PT - OOB  Fever 3/05 with concern for HCAP. Plan - Day 6/7 fortaz  Acute on chronic combined CHF with cor pulmonale. Paroxysmal A fib on xarelto as outpt >> a fib with RVR during SBT 3/06. HTN, HLD. Prolonged QTc. Plan - Lasix per CHF - D/C free water - D5W 50 ml/hr - Continue clonidine, zebeta, hydralazine, isordil - Continue heparin gtt - Heart failure team following, appreciate input  Acute metabolic encephalopathy 2nd to respiratory failure. Plan - RASS goal 0 to -1  Gastritis. Plan - Changed protonix to 40 mg daily  DM type II  with steroid induced hyperglycemia. Plan - SSI with levemir 15 units daily  Anemia of critical illness and chronic disease. Plan - F/u CBC - Transfuse for Hb < 7  PCCM will continue to manage  Best practice:  Diet: tube feeds DVT prophylaxis: heparin gtt GI prophylaxis: Protonix Mobility: Bedrest Code Status: Full code Family Communication: updated wife at bedside 3/07 Disposition: ICU  Labs     CMP Latest Ref Rng & Units 01/11/2019 01/11/2019 01/10/2019  Glucose 70 - 99 mg/dL - 200(H) 248(H)  BUN 8 - 23 mg/dL - 85(H) 70(H)  Creatinine 0.61 - 1.24 mg/dL - 1.51(H) 1.24  Sodium 135 - 145 mmol/L 160(H) 159(H) 156(H)  Potassium 3.5 - 5.1 mmol/L 3.8 3.9 3.4(L)  Chloride 98 - 111 mmol/L - 115(H) 113(H)  CO2 22 - 32 mmol/L - 34(H) 35(H)  Calcium 8.9 - 10.3 mg/dL - 9.1 9.2  Total Protein 6.5 - 8.1 g/dL - - -  Total Bilirubin 0.3 - 1.2 mg/dL - - -  Alkaline Phos 38 - 126 U/L - - -  AST 15 - 41 U/L - - -  ALT 0 - 44 U/L - - -   CBC Latest Ref Rng & Units 01/11/2019 01/11/2019 01/10/2019  WBC 4.0 - 10.5 K/uL - 15.6(H) 15.2(H)  Hemoglobin 13.0 - 17.0 g/dL 9.9(L) 8.4(L) 9.0(L)  Hematocrit 39.0 - 52.0 % 29.0(L) 30.1(L) 31.7(L)  Platelets 150 - 400 K/uL - 346 359   ABG    Component Value Date/Time   PHART 7.478 (H) 01/11/2019 0440   PCO2ART 48.5 (H) 01/11/2019 0440   PO2ART 102.0 01/11/2019 0440   HCO3 36.0 (H) 01/11/2019 0440   TCO2 38 (H) 01/11/2019 0440   ACIDBASEDEF 2.0 02/19/2015 1003   O2SAT 98.0 01/11/2019 0440   CBG (last 3)  Recent Labs    01/10/19 2312 01/11/19 0301 01/11/19 0804  GLUCAP 124* 173* 194*   The patient is critically ill with multiple organ systems failure and requires high complexity decision making for assessment and support, frequent evaluation and titration of therapies, application of advanced monitoring technologies and extensive interpretation of multiple databases.   Critical Care Time devoted to patient care services described in this note is  32  Minutes. This time reflects time of care of this signee Dr Jennet Maduro. This critical care time does not reflect procedure time, or teaching time or supervisory time of PA/NP/Med student/Med Resident etc but could involve care discussion time.  Rush Farmer, M.D. [facility] Pulmonary/Critical Care Medicine. Pager: [phone]. After hours pager: [phone].

## 2019-01-12 LAB — CBC
HCT: 26.8 % — ABNORMAL LOW (ref 39.0–52.0)
Hemoglobin: 7.6 g/dL — ABNORMAL LOW (ref 13.0–17.0)
MCH: 26 pg (ref 26.0–34.0)
MCHC: 28.4 g/dL — ABNORMAL LOW (ref 30.0–36.0)
MCV: 91.8 fL (ref 80.0–100.0)
Platelets: 322 10*3/uL (ref 150–400)
RBC: 2.92 MIL/uL — AB (ref 4.22–5.81)
RDW: 19.8 % — ABNORMAL HIGH (ref 11.5–15.5)
WBC: 16.1 10*3/uL — ABNORMAL HIGH (ref 4.0–10.5)
nRBC: 0 % (ref 0.0–0.2)

## 2019-01-12 LAB — GLUCOSE, CAPILLARY
GLUCOSE-CAPILLARY: 187 mg/dL — AB (ref 70–99)
GLUCOSE-CAPILLARY: 199 mg/dL — AB (ref 70–99)
Glucose-Capillary: 130 mg/dL — ABNORMAL HIGH (ref 70–99)
Glucose-Capillary: 142 mg/dL — ABNORMAL HIGH (ref 70–99)
Glucose-Capillary: 182 mg/dL — ABNORMAL HIGH (ref 70–99)
Glucose-Capillary: 182 mg/dL — ABNORMAL HIGH (ref 70–99)
Glucose-Capillary: 298 mg/dL — ABNORMAL HIGH (ref 70–99)

## 2019-01-12 LAB — BASIC METABOLIC PANEL
Anion gap: 5 (ref 5–15)
BUN: 65 mg/dL — ABNORMAL HIGH (ref 8–23)
CO2: 36 mmol/L — AB (ref 22–32)
Calcium: 8.7 mg/dL — ABNORMAL LOW (ref 8.9–10.3)
Chloride: 113 mmol/L — ABNORMAL HIGH (ref 98–111)
Creatinine, Ser: 1.18 mg/dL (ref 0.61–1.24)
GFR calc Af Amer: >60 mL/min (ref >60)
GFR calc non Af Amer: >60 mL/min (ref >60)
Glucose, Bld: 142 mg/dL — ABNORMAL HIGH (ref 70–99)
Potassium: 3 mmol/L — ABNORMAL LOW (ref 3.5–5.1)
Sodium: 154 mmol/L — ABNORMAL HIGH (ref 135–145)

## 2019-01-12 LAB — APTT: aPTT: 69 seconds — ABNORMAL HIGH (ref 24–36)

## 2019-01-12 LAB — HEPARIN LEVEL (UNFRACTIONATED): Heparin Unfractionated: 0.59 IU/mL (ref 0.30–0.70)

## 2019-01-12 LAB — MAGNESIUM: MAGNESIUM: 2.4 mg/dL (ref 1.7–2.4)

## 2019-01-12 LAB — PHOSPHORUS: PHOSPHORUS: 3.6 mg/dL (ref 2.5–4.6)

## 2019-01-12 MED ORDER — GABAPENTIN 600 MG PO TABS
600.0000 mg | ORAL_TABLET | Freq: Two times a day (BID) | ORAL | Status: DC
  Administered 2019-01-12 – 2019-01-14 (×5): 600 mg via ORAL
  Filled 2019-01-12 (×6): qty 1

## 2019-01-12 MED ORDER — POTASSIUM CHLORIDE CRYS ER 20 MEQ PO TBCR
40.0000 meq | EXTENDED_RELEASE_TABLET | Freq: Once | ORAL | Status: AC
  Administered 2019-01-12: 40 meq via ORAL
  Filled 2019-01-12: qty 2

## 2019-01-12 MED ORDER — OXYCODONE-ACETAMINOPHEN 5-325 MG PO TABS
1.0000 | ORAL_TABLET | Freq: Four times a day (QID) | ORAL | Status: DC | PRN
  Administered 2019-01-12: 1 via ORAL
  Administered 2019-01-13: 2 via ORAL
  Filled 2019-01-12: qty 1
  Filled 2019-01-12: qty 2

## 2019-01-12 MED ORDER — DOCUSATE SODIUM 50 MG PO CAPS
50.0000 mg | ORAL_CAPSULE | Freq: Two times a day (BID) | ORAL | Status: DC
  Administered 2019-01-12 – 2019-01-14 (×4): 50 mg via ORAL
  Filled 2019-01-12 (×5): qty 1

## 2019-01-12 MED ORDER — INSULIN ASPART 100 UNIT/ML ~~LOC~~ SOLN
0.0000 [IU] | Freq: Every day | SUBCUTANEOUS | Status: DC
  Administered 2019-01-13: 3 [IU] via SUBCUTANEOUS

## 2019-01-12 MED ORDER — PANTOPRAZOLE SODIUM 40 MG PO TBEC
40.0000 mg | DELAYED_RELEASE_TABLET | Freq: Two times a day (BID) | ORAL | Status: DC
  Administered 2019-01-12 – 2019-01-14 (×5): 40 mg via ORAL
  Filled 2019-01-12 (×4): qty 1

## 2019-01-12 MED ORDER — CLONIDINE HCL 0.1 MG PO TABS
0.1000 mg | ORAL_TABLET | Freq: Every day | ORAL | Status: AC
  Administered 2019-01-14: 0.1 mg via ORAL
  Filled 2019-01-12: qty 1

## 2019-01-12 MED ORDER — INSULIN ASPART 100 UNIT/ML ~~LOC~~ SOLN
0.0000 [IU] | Freq: Three times a day (TID) | SUBCUTANEOUS | Status: DC
  Administered 2019-01-12: 8 [IU] via SUBCUTANEOUS
  Administered 2019-01-13 (×3): 5 [IU] via SUBCUTANEOUS
  Administered 2019-01-14 (×2): 11 [IU] via SUBCUTANEOUS
  Administered 2019-01-14: 8 [IU] via SUBCUTANEOUS

## 2019-01-12 MED ORDER — ROSUVASTATIN CALCIUM 20 MG PO TABS
20.0000 mg | ORAL_TABLET | Freq: Every day | ORAL | Status: DC
  Administered 2019-01-12 – 2019-01-14 (×3): 20 mg via ORAL
  Filled 2019-01-12 (×3): qty 1

## 2019-01-12 MED ORDER — DOCUSATE SODIUM 50 MG/5ML PO LIQD
50.0000 mg | Freq: Two times a day (BID) | ORAL | Status: DC
  Filled 2019-01-12: qty 10

## 2019-01-12 MED ORDER — CLONIDINE HCL 0.1 MG PO TABS
0.1000 mg | ORAL_TABLET | Freq: Two times a day (BID) | ORAL | Status: AC
  Administered 2019-01-13 (×2): 0.1 mg via ORAL
  Filled 2019-01-12 (×2): qty 1

## 2019-01-12 MED ORDER — ADULT MULTIVITAMIN W/MINERALS CH
1.0000 | ORAL_TABLET | Freq: Every day | ORAL | Status: DC
  Administered 2019-01-12 – 2019-01-14 (×3): 1 via ORAL
  Filled 2019-01-12 (×3): qty 1

## 2019-01-12 MED ORDER — HYDRALAZINE HCL 25 MG PO TABS
12.5000 mg | ORAL_TABLET | Freq: Three times a day (TID) | ORAL | Status: DC
  Filled 2019-01-12: qty 0.5

## 2019-01-12 MED ORDER — TORSEMIDE 20 MG PO TABS
80.0000 mg | ORAL_TABLET | Freq: Two times a day (BID) | ORAL | Status: DC
  Administered 2019-01-12 – 2019-01-13 (×2): 80 mg via ORAL
  Filled 2019-01-12 (×2): qty 4

## 2019-01-12 MED ORDER — FOLIC ACID 1 MG PO TABS
1.0000 mg | ORAL_TABLET | Freq: Every day | ORAL | Status: DC
  Administered 2019-01-12 – 2019-01-14 (×3): 1 mg via ORAL
  Filled 2019-01-12 (×3): qty 1

## 2019-01-12 MED ORDER — ISOSORB DINITRATE-HYDRALAZINE 20-37.5 MG PO TABS
0.5000 | ORAL_TABLET | Freq: Three times a day (TID) | ORAL | Status: DC
  Administered 2019-01-12 – 2019-01-14 (×7): 0.5 via ORAL
  Filled 2019-01-12 (×2): qty 1
  Filled 2019-01-12: qty 0.5
  Filled 2019-01-12 (×2): qty 1
  Filled 2019-01-12: qty 0.5
  Filled 2019-01-12: qty 1
  Filled 2019-01-12: qty 0.5

## 2019-01-12 MED ORDER — PREDNISONE 10 MG PO TABS
10.0000 mg | ORAL_TABLET | Freq: Every day | ORAL | Status: DC
  Administered 2019-01-13 – 2019-01-14 (×2): 10 mg via ORAL
  Filled 2019-01-12 (×2): qty 1

## 2019-01-12 MED ORDER — CLONIDINE HCL 0.1 MG PO TABS
0.1000 mg | ORAL_TABLET | Freq: Four times a day (QID) | ORAL | Status: AC
  Administered 2019-01-12 – 2019-01-13 (×4): 0.1 mg via ORAL
  Filled 2019-01-12 (×4): qty 1

## 2019-01-12 MED ORDER — RIVAROXABAN 20 MG PO TABS
20.0000 mg | ORAL_TABLET | Freq: Every day | ORAL | Status: DC
  Administered 2019-01-12 – 2019-01-14 (×3): 20 mg via ORAL
  Filled 2019-01-12 (×3): qty 1

## 2019-01-12 NOTE — Progress Notes (Signed)
Nutrition Follow-up  DOCUMENTATION CODES:   Obesity unspecified  INTERVENTION:    Continue Heart healthy CHO modified diet  Add multivitamin daily  NUTRITION DIAGNOSIS:   Inadequate oral intake related to inability to eat as evidenced by NPO status.  Resolved   GOAL:   Patient will meet greater than or equal to 90% of their needs  Met   MONITOR:   PO intake, Labs, Skin  ASSESSMENT:   73 yo male with PMH of HTN, DM, HLD, CHF, NICM, CAD, COPD, and tobacco abuse who was admitted on 2/27 with decompensated heart failure. Developed respiratory distress and required intubation and transfer to the ICU on 3/3.   Extubated 3/10. S/P SLP evaluation diet advanced to heart healthy carbohydrate modified. Patient is eating very well. He consumed 100% of breakfast today. Suspect he will meet nutrition needs with intake of meals.    Labs and medications reviewed.  Diet Order:   Diet Order            Diet heart healthy/carb modified Room service appropriate? Yes; Fluid consistency: Thin  Diet effective now              EDUCATION NEEDS:   No education needs have been identified at this time  Skin:  Skin Assessment: Skin Integrity Issues: Skin Integrity Issues:: Stage I, Other (Comment) Stage I: buttocks Other: MASD to groin, perineum   Last BM:  2/29  Height:   Ht Readings from Last 1 Encounters:  01/09/19 6' (1.829 m)    Weight:   Wt Readings from Last 1 Encounters:  01/12/19 117.3 kg    Ideal Body Weight:  80.9 kg  BMI:  Body mass index is 35.07 kg/m.  Estimated Nutritional Needs:   Kcal:  2000-2200  Protein:  115-130 gm  Fluid:  2 L    Molli Barrows, RD, LDN, [place] Pager [phone] After Hours Pager [phone]

## 2019-01-12 NOTE — Progress Notes (Addendum)
Patient ID: Nicholas Caldwell, male   DOB: 1946/03/14, 73 y.o.   MRN: [id number]     Advanced Heart Failure Rounding Note  PCP-Cardiologist: No primary care provider on file.   Subjective:    Tmax 100.8. CVP 6-7. CR 1.18, BUN coming down. K 3.0   Underwent TEE/DCCV 01/10/19.   Awake and alert off vent. Denies SOB. Appetite improving. No pain.   Objective:   Weight Range: 117.3 kg Body mass index is 35.07 kg/m.   Vital Signs:   Temp:  [97.8 F (36.6 C)-100.8 F (38.2 C)] 98.4 F (36.9 C) (03/11 0700) Pulse Rate:  [62-87] 81 (03/11 0700) Resp:  [14-29] 26 (03/11 0700) BP: (86-129)/(41-84) 92/47 (03/11 0700) SpO2:  [88 %-100 %] 98 % (03/11 0700) FiO2 (%):  [40 %] 40 % (03/10 0832) Weight:  [117.3 kg] 117.3 kg (03/11 0418) Last BM Date: 01/01/19  Weight change: Filed Weights   01/10/19 0400 01/11/19 0500 01/12/19 0418  Weight: 113.3 kg 113.7 kg 117.3 kg    Intake/Output:   Intake/Output Summary (Last 24 hours) at 01/12/2019 0811 Last data filed at 01/12/2019 0700 Gross per 24 hour  Intake 1827.24 ml  Output 1775 ml  Net 52.24 ml     Physical Exam  CVP 6-7  General:NAD.  HEENT: + ETT Neck: Supple. JVP ~8. Carotids 2+ bilat; no bruits. No thyromegaly or nodule noted. Cor: PMI nondisplaced. RRR, No M/G/R noted Lungs: Diminished basilar sounds Abdomen: Soft, non-tender, non-distended, no HSM. No bruits or masses. +BS  Extremities: No cyanosis, clubbing, or rash. Trace ankle edema.  Neuro: Alert & oriented x 3. Cranial nerves grossly intact. Moves all 4 extremities w/o difficulty. Affect pleasant    Telemetry   NSR 60-70s, occasional PVCs, personally reviewed.   EKG    No new tracings.    Labs    CBC Recent Labs    01/11/19 0409 01/11/19 0440 01/12/19 0425  WBC 15.6*  --  16.1*  HGB 8.4* 9.9* 7.6*  HCT 30.1* 29.0* 26.8*  MCV 91.5  --  91.8  PLT 346  --  791   Basic Metabolic Panel Recent Labs    01/11/19 0409 01/11/19 0440 01/12/19 0425  NA 159*  160* 154*  K 3.9 3.8 3.0*  CL 115*  --  113*  CO2 34*  --  36*  GLUCOSE 200*  --  142*  BUN 85*  --  65*  CREATININE 1.51*  --  1.18  CALCIUM 9.1  --  8.7*  MG 2.5*  --  2.4  PHOS 3.2  --  3.6   Liver Function Tests No results for input(s): AST, ALT, ALKPHOS, BILITOT, PROT, ALBUMIN in the last 72 hours. No results for input(s): LIPASE, AMYLASE in the last 72 hours. Cardiac Enzymes No results for input(s): CKTOTAL, CKMB, CKMBINDEX, TROPONINI in the last 72 hours.  BNP: BNP (last 3 results) Recent Labs    11/21/18 2238 12/01/18 0015 12/30/18 1621  BNP 1,739.7* 1,666.7* 1,273.0*    ProBNP (last 3 results) No results for input(s): PROBNP in the last 8760 hours.   D-Dimer No results for input(s): DDIMER in the last 72 hours. Hemoglobin A1C No results for input(s): HGBA1C in the last 72 hours. Fasting Lipid Panel No results for input(s): CHOL, HDL, LDLCALC, TRIG, CHOLHDL, LDLDIRECT in the last 72 hours. Thyroid Function Tests No results for input(s): TSH, T4TOTAL, T3FREE, THYROIDAB in the last 72 hours.  Invalid input(s): FREET3  Other results:   Imaging    No  results found.   Medications:     Scheduled Medications: . bisoprolol  2.5 mg Oral Daily  . cloNIDine  0.1 mg Oral Q6H   Followed by  . [START ON 01/13/2019] cloNIDine  0.1 mg Oral BID   Followed by  . [START ON 01/14/2019] cloNIDine  0.1 mg Oral Daily  . docusate  50 mg Per Tube BID  . free water  350 mL Per Tube Q6H  . hydrALAZINE  25 mg Per Tube Q8H  . insulin aspart  3-9 Units Subcutaneous Q4H  . insulin detemir  15 Units Subcutaneous Daily  . isosorbide dinitrate  10 mg Per Tube TID  . latanoprost  1 drop Both Eyes QHS  . mouth rinse  15 mL Mouth Rinse BID  . pantoprazole sodium  40 mg Per Tube BID  . predniSONE  10 mg Per Tube Q breakfast  . sodium chloride flush  3 mL Intravenous Q12H    Infusions: . sodium chloride 10 mL/hr at 01/12/19 0700  . sodium chloride Stopped (01/10/19 1414)    . sodium chloride    . cefTAZidime (FORTAZ)  IV Stopped (01/12/19 4268)  . dextrose 50 mL/hr at 01/12/19 0700  . heparin 1,000 Units/hr (01/12/19 0700)    PRN Medications: acetaminophen (TYLENOL) oral liquid 160 mg/5 mL, bisacodyl, ipratropium-albuterol, labetalol, sodium chloride flush    Patient Profile   Nicholas Caldwell a 73 y.o.malePMH of chronic systolic CHF/nonischemic cardiomyopathy Echo 12/13/2016 LVEF 40-45%, COPD on home oxygen, PAF and history of non compliance.   Admitted with A/C respiratory failure and A/C systolic heart failure.  Assessment/Plan   1. Acute/chronic systolic CHF: Nonischemic cardiomyopathy based on cath in 2/20. Echo this admission with EF 45-50% but severe RV dilation/dysfunction. Severe COPD/OHS/OSAon home oxygen is a confounder and likely cause of RV failure/cor pulmonale. TEE with EF 40-45%, moderate LVH, severe RV dilation with moderately decreased systolic function.  CVP 6-7 - Resume torsemide 80 mg BID this evening.  - Decrease hydralazine to 12.5 mg TID with isordil 10 TID while intubated. - Continue bisoprolol 2.5 mg daily. - He is on clonidine as part of a clinical trial, titrate per protocol.  - Think he would be a good Cardiomems candidateeventually. 2. Atrial fibrillation: Paroxysmal. He went into atrial fibrillation after intubation. On Xarelto at home. - Has not been felt to be a candidate for Tikosyn and amiodarone due to QT prolongation and severe lung disease. - Heparin gtt restarted given no further coffee grounds. Can likely resume Xarelto.  - Holding NSR s/p TEE/DCCV.  3. Pulmonary: Has COPD, wears home oxygen at baseline. Suspect COPD exacerbation with LLL PNA. WBCs 16.1. Tmax 100.8.  Now treating with ceftazidime for HCAP.  -Continue nebs and steroids per CCM. - On ceftazidime for HCAP.  - Extubated 01/11/2019. 4. Anemia: H/o GI bleeding. Hemoglobin stable in setting of coffee grounds emesis, now resolved.   -  Continue PPI. - Heparin gtt restarted.  Can likely restart Xarelto today.  5. OHS/OSA: Hypercarbic respiratory failure in setting of OHS/OSA and COPD. OSA diagnosed on sleep study, but having a lot of trouble getting CPAP from the [place]. Will need to see if we can get outside of [place]. Suspect that OHS/OSA plays a large role in his RV failure via hypoxic pulmonary vasoconstriction.  - Vent management per CCM, will need CPAP long-term. 6. Hypernatremia:  - Na 154 this am.   - This should correct now that extubated and taking po.  7. AKI on CKD III -  CVP 1.2 -> 1.5 -> 1.18  Can likely move out of ICU.   Shirley Friar, PA-C  01/12/2019 8:11 AM   Advanced Heart Failure Team Pager [phone] (M-F; 7a - 4p)  Please contact [facility] for night-coverage after hours (4p -7a ) and weekends on [email or web address]  Patient seen with PA, agree with the above note.    Extubated yesterday, still with low grade fevers.  He is maintaining NSR with PACs after DCCV.  CVP 6-7, BUN/creatinine improved with holding diuretics yesterday. Awake on vent.  Still hypernatremic.    On exam, no JVD.  No edema.  Regular S1S2.  Decreased BS bilaterally.   Can restart home torsemide 80 mg bid in evening.     No overt bleeding though hemoglobin lower.  Stop heparin gtt and start Xarelto.    Hypernatremia should improve now that extubated and taking po.   Continue treatment of PNA with ceftazidime.   With soft BP, will decrease hydralazine to 12.5 tid, transition back to Imdur.   With OHS/OSA, should get set up for CPAP at home.   Mobilize.   Loralie Champagne 01/12/2019 8:28 AM

## 2019-01-12 NOTE — Progress Notes (Signed)
Beatrice for heparin > transition back to Xarelto Indication: atrial fibrillation  No Known Allergies  Patient Measurements: Height: 6' (182.9 cm) Weight: 258 lb 9.6 oz (117.3 kg) IBW/kg (Calculated) : 77.6 Heparin Dosing Weight: 107 kg  Vital Signs: Temp: 98.4 F (36.9 C) (03/11 0700) Temp Source: Oral (03/11 0700) BP: 108/54 (03/11 0900) Pulse Rate: 69 (03/11 1000)  Labs: Recent Labs    01/10/19 0403 01/10/19 1203 01/11/19 0409 01/11/19 0440 01/12/19 0425  HGB 9.0*  --  8.4* 9.9* 7.6*  HCT 31.7*  --  30.1* 29.0* 26.8*  PLT 359  --  346  --  322  APTT 57* 75* 68*  --  69*  HEPARINUNFRC 0.63  --  0.81*  --  0.59  CREATININE 1.24  --  1.51*  --  1.18    Estimated Creatinine Clearance: 77 mL/min (by C-G formula based on SCr of 1.18 mg/dL).  Assessment: 26 yom admitted with respiratory and systolic heart failure, on Xarelto PTA for Afib (last dose on 3/2_0 ). Presented with AMS with inability to protect his airway - now intubated and transferred to 27M.   Was transitioned to heparin gtt on 3/3 but held on 3/4 due to concerns for GI bleed.   Heparin level likely still somewhat elevated from recent Xarelto use.  APTT remains within goal range.  Hgb down today, but no overt bleeding or complications noted.  Patient underwent successful DCCV 3/9 and was extubated 3/10.  Goal of Therapy:  Heparin level 0.3-0.5 units/ml - will aim for a lower goal due to inc risk of bleed APTT 66- ~ 85 or so Monitor platelets by anticoagulation protocol: Yes   Plan:  Continue IV Heparin at 1000 units/hr At 1700 pm tonight, will stop heparin and resume Xarelto 20 mg daily.  Marguerite Olea, [facility] Clinical Pharmacist Phone [phone]  01/12/2019 10:28 AM

## 2019-01-12 NOTE — Progress Notes (Signed)
  Speech Language Pathology Treatment: Dysphagia  Patient Details Name: Nicholas Caldwell MRN: [id number] DOB: [birth year] Today's Date: 01/12/2019 Time: 8309-4076 SLP Time Calculation (min) (ACUTE ONLY): 22 min  Assessment / Plan / Recommendation Clinical Impression  Pt was seen for skilled dysphagia treatment today. He denied any difficulties with PO intake since last ST session; RN was also present at beginning of session to confirm pt's satisfactory progress with meals. Pt consumed regular texture lunch items and thin liquid without overt s/s aspiration throughout. After set up assist, pt was able to feed himself and use swallow precautions with Mod I verbal assist from clinician. Recommend continue regular diet, thin liquids, meds whole with water, and no further acute ST needs are indicated at this time.   HPI HPI: 73 yo male presented with volume overload from acute on chronic biventricular CHF.  Developed progressive hypoxia, hypercapnia, and altered mental status.  Transferred to ICU intubated 01/04/19 (ETT 7 days).  Third hospitalization since January 2020.      SLP Plan  All goals met       Recommendations  Diet recommendations: Regular;Thin liquid Liquids provided via: Cup;Straw Medication Administration: Whole meds with liquid Supervision: Patient able to self feed Compensations: Slow rate;Small sips/bites;Minimize environmental distractions Postural Changes and/or Swallow Maneuvers: Seated upright 90 degrees                Oral Care Recommendations: Oral care BID Follow up Recommendations: 24 hour supervision/assistance SLP Visit Diagnosis: Dysphagia, unspecified (R13.10) Plan: All goals met       Jettie Booze, Student SLP                Jettie Booze 01/12/2019, 12:37 PM

## 2019-01-12 NOTE — Progress Notes (Signed)
NAME:  Nicholas Caldwell, MRN:  [id number], DOB:  [birth year], LOS: 66 ADMISSION DATE:  12/30/2018, CONSULTATION DATE:  3/3 REFERRING MD:  Florene Glen, CHIEF COMPLAINT:  Acute hypoxic and hypercarbic respiratory failure    Brief History   73 yo male presented with volume overload from acute on chronic biventricular CHF.  Developed progressive hypoxia, hypercapnia, and altered mental status.  Transferred to ICU 3/03 and intubated.  Third hospitalization since January 2020.  Past Medical History  Nonischemic cardiomyopathy, with left ventricular ejection fraction of 40 to 45%, chronic respiratory failure on nasal cannula at baseline,  Paroxysmal atrial fibrillation, sleep apnea, hypertension, prior noncompliance, HLD, s/p splenectomy  Significant Hospital Events   2/27: Admitted with decompensated heart failure 3/03: Critical care consulted, patient obtunded, more hypercarbic, worsening respiratory failure. 3/05: resume heparin gtt, fever, start ABx for HCAP 3/06: tried on pressure support >> a fib with RVR 3/08: doing better with pressure support  Consults:  Heart failure team 3/2  Procedures:  Endotracheal tube 3/3 >>   Significant Diagnostic Tests:  LHC/RHC 12/06/18 >> nonobstructive CAD, mod pulmonary HTN, PVR not significantly elevated CT chest 01/03/19 >> small/mod Rt and small Lt effusion, compressive ATX, CM  Micro Data:  Respiratory culture 3/3 >> oral flora  Antimicrobials:  Tressie Ellis 3/05 >>  Interim history/subjective:  No events overnight, tolerated extubation  Objective   Blood pressure (!) 108/54, pulse 71, temperature 98.4 F (36.9 C), temperature source Oral, resp. rate 20, height 6' (1.829 m), weight 117.3 kg, SpO2 98 %. CVP:  [6 mmHg-10 mmHg] 7 mmHg      Intake/Output Summary (Last 24 hours) at 01/12/2019 1001 Last data filed at 01/12/2019 0900 Gross per 24 hour  Intake 2125.72 ml  Output 1900 ml  Net 225.72 ml   Filed Weights   01/10/19 0400 01/11/19 0500 01/12/19  0418  Weight: 113.3 kg 113.7 kg 117.3 kg    Examination:  General - Well appearing, moving all ext to command Eyes - PERRL, EOM-I ENT - [place]/AT, -LAN and thyromegaly  Cardiac - IRIR, Nl S1/S2 and -M/R/G Chest - CTA bilaterally, weaning Abdomen - Soft, NT, ND and +BS Extremities - -edema and -tenderness Skin - Intact Neuro - Alert and interactive, moving all ext to command  I reviewed CXR myself, no acute disease noted  Resolved Hospital Problem list     Assessment & Plan:   Acute on chronic hypoxic/hypercapnic respiratory failure. OSA/OHS. COPD. Pleural effusions with atelectasis. Plan - Titrate O2 for sat of 88-92% - Continue prednisone 10 mg PO daily - Diureses per cards at this point - CPAP at night - PT - OOB  Fever 3/05 with concern for HCAP. Plan - Day 7/7 fortaz  Acute on chronic combined CHF with cor pulmonale. Paroxysmal A fib on xarelto as outpt >> a fib with RVR during SBT 3/06. HTN, HLD. Prolonged QTc. Plan - Lasix per CHF - D/C free water - KVO IVF - Continue clonidine, zebeta, hydralazine, isordil - Continue heparin gtt - Heart failure team following, appreciate input  Acute metabolic encephalopathy 2nd to respiratory failure. Plan - RASS goal 0 to -1  Gastritis. Plan - Changed protonix to 40 mg daily  DM type II with steroid induced hyperglycemia. Plan - SSI with levemir 15 units daily  Anemia of critical illness and chronic disease. Plan - F/u CBC - Transfuse for Hb < 7  Discussed with PCCM-NP  Best practice:  Diet: tube feeds DVT prophylaxis: heparin gtt GI prophylaxis: Protonix Mobility: Bedrest Code  Status: Full code Family Communication: updated wife at bedside 3/07 Disposition: ICU  Labs    CMP Latest Ref Rng & Units 01/12/2019 01/11/2019 01/11/2019  Glucose 70 - 99 mg/dL 142(H) - 200(H)  BUN 8 - 23 mg/dL 65(H) - 85(H)  Creatinine 0.61 - 1.24 mg/dL 1.18 - 1.51(H)  Sodium 135 - 145 mmol/L 154(H) 160(H) 159(H)   Potassium 3.5 - 5.1 mmol/L 3.0(L) 3.8 3.9  Chloride 98 - 111 mmol/L 113(H) - 115(H)  CO2 22 - 32 mmol/L 36(H) - 34(H)  Calcium 8.9 - 10.3 mg/dL 8.7(L) - 9.1  Total Protein 6.5 - 8.1 g/dL - - -  Total Bilirubin 0.3 - 1.2 mg/dL - - -  Alkaline Phos 38 - 126 U/L - - -  AST 15 - 41 U/L - - -  ALT 0 - 44 U/L - - -   CBC Latest Ref Rng & Units 01/12/2019 01/11/2019 01/11/2019  WBC 4.0 - 10.5 K/uL 16.1(H) - 15.6(H)  Hemoglobin 13.0 - 17.0 g/dL 7.6(L) 9.9(L) 8.4(L)  Hematocrit 39.0 - 52.0 % 26.8(L) 29.0(L) 30.1(L)  Platelets 150 - 400 K/uL 322 - 346   ABG    Component Value Date/Time   PHART 7.478 (H) 01/11/2019 0440   PCO2ART 48.5 (H) 01/11/2019 0440   PO2ART 102.0 01/11/2019 0440   HCO3 36.0 (H) 01/11/2019 0440   TCO2 38 (H) 01/11/2019 0440   ACIDBASEDEF 2.0 02/19/2015 1003   O2SAT 98.0 01/11/2019 0440   CBG (last 3)  Recent Labs    01/11/19 2344 01/12/19 0358 01/12/19 0719  GLUCAP 187* 130* 142*   Discussed with PCCM, will transfer to tele and to [facility] with PCCM off 3/12  Rush Farmer, M.D. [facility] Pulmonary/Critical Care Medicine. Pager: [phone]. After hours pager: [phone].

## 2019-01-12 NOTE — Progress Notes (Signed)
[facility] Progress Note Patient Name: Cyree Chuong DOB: [birth year] MRN: [id number]   Date of Service  01/12/2019  HPI/Events of Note  K+ = 3.0 and Creatinine = 1.18.   eICU Interventions  Will replace K+.      Intervention Category Major Interventions: Electrolyte abnormality - evaluation and management  Nashly Olsson [place] 01/12/2019, 6:14 AM

## 2019-01-12 NOTE — Evaluation (Addendum)
Physical  Therapy Evaluation Patient Details Name: Nicholas Caldwell MRN: [id number] DOB: Apr 09, 1946 Today's Date: 01/12/2019   History of Present Illness  73 yo male presented with volume overload from acute on chronic biventricular CHF.  Developed progressive hypoxia, hypercapnia, and altered mental status.  Transferred to ICU 3/03 and intubated.  Third hospitalization since January 2020. Intubated 3/3-3/10/20    Clinical Impression  Pt admitted with above diagnosis. Pt currently with functional limitations due to the deficits listed below (see PT Problem List). PTA pt home with wife shortly from prior admission beginning to work with HHPT, utilizing RW for home ambulation. Today patient weak and deconditioned, standing EOB with max  A and side stepping along bed. VSS on 5L. Eager to walk and return to PLOF.   Pt will benefit from skilled PT to increase their independence and safety with mobility to allow discharge to the venue listed below.       Follow Up Recommendations CIR    Equipment Recommendations  None recommended by PT    Recommendations for Other Services OT consult;Rehab consult     Precautions / Restrictions Precautions Precautions: Fall Precaution Comments: 3-4L O2 [place] baseline Restrictions Weight Bearing Restrictions: No      Mobility  Bed Mobility Overal bed mobility: Needs Assistance Bed Mobility: Supine to Sit       Sit to supine: Max assist      Transfers Overall transfer level: Needs assistance Equipment used: Rolling walker (2 wheeled) Transfers: Sit to/from Stand Sit to Stand: Mod assist         General transfer comment: several trials to stand from elevated bed with max a this visit, evnetualy able to, and take side steps along bed with premature sit from weakness. if ambulating, close chair follow.   Ambulation/Gait             General Gait Details: unable today.  Stairs            Wheelchair Mobility    Modified Rankin  (Stroke Patients Only)       Balance Overall balance assessment: Needs assistance   Sitting balance-Leahy Scale: Good       Standing balance-Leahy Scale: Poor                               Pertinent Vitals/Pain Pain Assessment: No/denies pain    Home Living Family/patient expects to be discharged to:: Private residence Living Arrangements: Spouse/significant other Available Help at Discharge: Family;Available 24 hours/day Type of Home: House Home Access: Stairs to enter Entrance Stairs-Rails: None Entrance Stairs-Number of Steps: 1 Home Layout: One level Home Equipment: Walker - 2 wheels;Cane - single point;Bedside commode;Shower seat Additional Comments: on 3L of O2 at all times at home    Prior Function Level of Independence: Needs assistance   Gait / Transfers Assistance Needed: uses RW for mobility  ADL's / Homemaking Assistance Needed: assist from wife for LB ADL and wife does IADL  Comments: denies falls between admssions. was working with [place]        Extremity/Trunk Assessment   Upper Extremity Assessment Upper Extremity Assessment: Generalized weakness    Lower Extremity Assessment Lower Extremity Assessment: Generalized weakness       Communication      Cognition Arousal/Alertness: Awake/alert Behavior During Therapy: WFL for tasks assessed/performed;Agitated Overall Cognitive Status: Within Functional Limits for tasks assessed Area of Impairment: Safety/judgement;Awareness;Problem solving  General Comments: patient with por insight, wanting to walk hallway tonight when max A to stand and too weak to step off bed.       General Comments      Exercises     Assessment/Plan    PT Assessment Patient needs continued PT services  PT Problem List Decreased strength;Decreased activity tolerance;Decreased balance;Decreased coordination;Decreased mobility;Decreased  knowledge of use of DME;Decreased safety awareness;Decreased knowledge of precautions;Pain       PT Treatment Interventions DME instruction;Gait training;Stair training;Functional mobility training;Therapeutic activities;Balance training;Therapeutic exercise;Neuromuscular re-education;Patient/family education    PT Goals (Current goals can be found in the Care Plan section)  Acute Rehab PT Goals Patient Stated Goal: return home PT Goal Formulation: With patient Time For Goal Achievement: 01/30/19 Potential to Achieve Goals: Fair    Frequency Min 3X/week   Barriers to discharge        Co-evaluation               AM-PAC PT "6 Clicks" Mobility  Outcome Measure Help needed turning from your back to your side while in a flat bed without using bedrails?: A Little Help needed moving from lying on your back to sitting on the side of a flat bed without using bedrails?: A Little Help needed moving to and from a bed to a chair (including a wheelchair)?: A Little Help needed standing up from a chair using your arms (e.g., wheelchair or bedside chair)?: A Lot Help needed to walk in hospital room?: Total Help needed climbing 3-5 steps with a railing? : Total 6 Click Score: 13    End of Session Equipment Utilized During Treatment: Oxygen Activity Tolerance: Patient limited by fatigue Patient left: in bed;with call bell/phone within reach;with family/visitor present Nurse Communication: Mobility status PT Visit Diagnosis: Other abnormalities of gait and mobility (R26.89);Muscle weakness (generalized) (M62.81)    Time: 1830-1900 PT Time Calculation (min) (ACUTE ONLY): 30 min   Charges:   PT Evaluation $PT Eval Moderate Complexity: 1 Mod PT Treatments $Therapeutic Activity: 8-22 mins       Reinaldo Berber, PT, DPT Acute Rehabilitation Services Pager: [phone] Office: [phone]    Reinaldo Berber 01/12/2019, 11:49 PM

## 2019-01-13 DIAGNOSIS — I10 Essential (primary) hypertension: Secondary | ICD-10-CM

## 2019-01-13 DIAGNOSIS — I5023 Acute on chronic systolic (congestive) heart failure: Secondary | ICD-10-CM

## 2019-01-13 LAB — GLUCOSE, CAPILLARY
GLUCOSE-CAPILLARY: 246 mg/dL — AB (ref 70–99)
Glucose-Capillary: 224 mg/dL — ABNORMAL HIGH (ref 70–99)
Glucose-Capillary: 239 mg/dL — ABNORMAL HIGH (ref 70–99)
Glucose-Capillary: 271 mg/dL — ABNORMAL HIGH (ref 70–99)

## 2019-01-13 LAB — BASIC METABOLIC PANEL
ANION GAP: 10 (ref 5–15)
BUN: 55 mg/dL — ABNORMAL HIGH (ref 8–23)
CO2: 32 mmol/L (ref 22–32)
Calcium: 8.5 mg/dL — ABNORMAL LOW (ref 8.9–10.3)
Chloride: 107 mmol/L (ref 98–111)
Creatinine, Ser: 1.45 mg/dL — ABNORMAL HIGH (ref 0.61–1.24)
GFR calc Af Amer: 56 mL/min — ABNORMAL LOW (ref >60)
GFR calc non Af Amer: 48 mL/min — ABNORMAL LOW (ref >60)
GLUCOSE: 219 mg/dL — AB (ref 70–99)
Potassium: 3.3 mmol/L — ABNORMAL LOW (ref 3.5–5.1)
Sodium: 149 mmol/L — ABNORMAL HIGH (ref 135–145)

## 2019-01-13 LAB — CBC
HEMATOCRIT: 26.3 % — AB (ref 39.0–52.0)
Hemoglobin: 7.5 g/dL — ABNORMAL LOW (ref 13.0–17.0)
MCH: 25.3 pg — ABNORMAL LOW (ref 26.0–34.0)
MCHC: 28.5 g/dL — ABNORMAL LOW (ref 30.0–36.0)
MCV: 88.9 fL (ref 80.0–100.0)
NRBC: 0.2 % (ref 0.0–0.2)
Platelets: 292 10*3/uL (ref 150–400)
RBC: 2.96 MIL/uL — ABNORMAL LOW (ref 4.22–5.81)
RDW: 19.6 % — ABNORMAL HIGH (ref 11.5–15.5)
WBC: 13.5 10*3/uL — AB (ref 4.0–10.5)

## 2019-01-13 MED ORDER — POTASSIUM CHLORIDE CRYS ER 20 MEQ PO TBCR
30.0000 meq | EXTENDED_RELEASE_TABLET | ORAL | Status: AC
  Administered 2019-01-13 (×2): 30 meq via ORAL
  Filled 2019-01-13 (×2): qty 1

## 2019-01-13 MED ORDER — TORSEMIDE 20 MG PO TABS: 80.0000 mg | ORAL_TABLET | Freq: Two times a day (BID) | ORAL | Status: DC

## 2019-01-13 MED ORDER — OXYCODONE-ACETAMINOPHEN 5-325 MG PO TABS
2.0000 | ORAL_TABLET | Freq: Four times a day (QID) | ORAL | Status: DC | PRN
  Administered 2019-01-13 – 2019-01-14 (×4): 2 via ORAL
  Filled 2019-01-13 (×4): qty 2

## 2019-01-13 NOTE — Progress Notes (Signed)
RT placed pt on CPAP dream station for the night. Pt is on home setting of 8 cmH2O with 2 lpm bled into the system. Pt tolerating well. RT will continue to monitor.

## 2019-01-13 NOTE — Care Management Important Message (Signed)
Important Message  Patient Details  Name: Nicholas Caldwell MRN: [id number] Date of Birth: May 29, 1946   Medicare Important Message Given:  Yes    Corvette Orser P Joanne Brander 01/13/2019, 3:03 PM

## 2019-01-13 NOTE — Progress Notes (Signed)
Dr. Jimmy Footman called back and RN made her aware of blood pressure of 101/51 with MAP of 63 and that clonidine 0.1 mg is due with order parameters to hold if systolic BP is < 90.  MD gave order to go ahead and give 0600 clonidine dose.

## 2019-01-13 NOTE — Progress Notes (Signed)
Rehab Admissions Coordinator Note:  Patient was screened by Retta Diones for appropriateness for an Inpatient Acute Rehab Consult.  At this time, we are recommending Inpatient Rehab consult.  Retta Diones 01/13/2019, 10:47 AM  I can be reached at [phone].

## 2019-01-13 NOTE — Progress Notes (Signed)
Placed patient on CPAP via FFM, 8.0 cm H20 previous settings, with 2 lpm O2 bleed in.

## 2019-01-13 NOTE — Progress Notes (Addendum)
Patient ID: Nicholas Caldwell, male   DOB: 07-Aug-1946, 73 y.o.   MRN: [id number]     Advanced Heart Failure Rounding Note  PCP-Cardiologist: No primary care provider on file.   Subjective:    Tmax 99. No central line. Cr  1.18 -> 1.45. K 3.3.   Underwent TEE/DCCV 01/10/19.   Feeling better this am. Stable on RA. Denies SOB at rest. No edema.   Objective:   Weight Range: 116.8 kg Body mass index is 34.92 kg/m.   Vital Signs:   Temp:  [97 F (36.1 C)-99 F (37.2 C)] 99 F (37.2 C) (03/12 0609) Pulse Rate:  [59-76] 76 (03/12 0609) Resp:  [17-29] 21 (03/12 0609) BP: (80-123)/(48-64) 101/51 (03/12 0609) SpO2:  [72 %-100 %] 94 % (03/12 0609) Weight:  [116.8 kg-117.8 kg] 116.8 kg (03/12 0609) Last BM Date: 01/01/19  Weight change: Filed Weights   01/12/19 0418 01/13/19 0402 01/13/19 0609  Weight: 117.3 kg 117.8 kg 116.8 kg   Intake/Output:   Intake/Output Summary (Last 24 hours) at 01/13/2019 1037 Last data filed at 01/13/2019 0605 Gross per 24 hour  Intake 1235.83 ml  Output 975 ml  Net 260.83 ml    Physical Exam   General:NAD HEENT: Normal Neck: Supple. JVP 7-8 cm6. Carotids 2+ bilat; no bruits. No thyromegaly or nodule noted. Cor: PMI nondisplaced. RRR, No M/G/R noted Lungs: CTAB, normal effort. Abdomen: Soft, non-tender, non-distended, no HSM. No bruits or masses. +BS  Extremities: No cyanosis, clubbing, or rash. Trace ankle edema.  Neuro: Alert & orientedx3, cranial nerves grossly intact. moves all 4 extremities w/o difficulty. Affect pleasant   Telemetry   NSR 60-70s, personally reviewed.   EKG    No new tracings.    Labs    CBC Recent Labs    01/12/19 0425 01/13/19 0554  WBC 16.1* 13.5*  HGB 7.6* 7.5*  HCT 26.8* 26.3*  MCV 91.8 88.9  PLT 322 177   Basic Metabolic Panel Recent Labs    01/11/19 0409  01/12/19 0425 01/13/19 0554  NA 159*   < > 154* 149*  K 3.9   < > 3.0* 3.3*  CL 115*  --  113* 107  CO2 34*  --  36* 32  GLUCOSE 200*  --   142* 219*  BUN 85*  --  65* 55*  CREATININE 1.51*  --  1.18 1.45*  CALCIUM 9.1  --  8.7* 8.5*  MG 2.5*  --  2.4  --   PHOS 3.2  --  3.6  --    < > = values in this interval not displayed.   Liver Function Tests No results for input(s): AST, ALT, ALKPHOS, BILITOT, PROT, ALBUMIN in the last 72 hours. No results for input(s): LIPASE, AMYLASE in the last 72 hours. Cardiac Enzymes No results for input(s): CKTOTAL, CKMB, CKMBINDEX, TROPONINI in the last 72 hours.  BNP: BNP (last 3 results) Recent Labs    11/21/18 2238 12/01/18 0015 12/30/18 1621  BNP 1,739.7* 1,666.7* 1,273.0*    ProBNP (last 3 results) No results for input(s): PROBNP in the last 8760 hours.   D-Dimer No results for input(s): DDIMER in the last 72 hours. Hemoglobin A1C No results for input(s): HGBA1C in the last 72 hours. Fasting Lipid Panel No results for input(s): CHOL, HDL, LDLCALC, TRIG, CHOLHDL, LDLDIRECT in the last 72 hours. Thyroid Function Tests No results for input(s): TSH, T4TOTAL, T3FREE, THYROIDAB in the last 72 hours.  Invalid input(s): FREET3  Other results:   Imaging  No results found.   Medications:     Scheduled Medications: . bisoprolol  2.5 mg Oral Daily  . cloNIDine  0.1 mg Oral BID   Followed by  . [START ON 01/14/2019] cloNIDine  0.1 mg Oral Daily  . docusate sodium  50 mg Oral BID  . folic acid  1 mg Oral Daily  . gabapentin  600 mg Oral BID  . insulin aspart  0-15 Units Subcutaneous TID WC  . insulin aspart  0-5 Units Subcutaneous QHS  . insulin detemir  15 Units Subcutaneous Daily  . isosorbide-hydrALAZINE  0.5 tablet Oral TID  . latanoprost  1 drop Both Eyes QHS  . mouth rinse  15 mL Mouth Rinse BID  . multivitamin with minerals  1 tablet Oral Daily  . pantoprazole  40 mg Oral BID  . predniSONE  10 mg Oral Q breakfast  . rivaroxaban  20 mg Oral Q supper  . rosuvastatin  20 mg Oral q1800  . sodium chloride flush  3 mL Intravenous Q12H  . torsemide  80 mg Oral  BID    Infusions: . sodium chloride 10 mL/hr at 01/13/19 0500  . sodium chloride Stopped (01/10/19 1414)  . sodium chloride      PRN Medications: acetaminophen (TYLENOL) oral liquid 160 mg/5 mL, bisacodyl, ipratropium-albuterol, labetalol, oxyCODONE-acetaminophen, sodium chloride flush    Patient Profile   Nicholas Caldwell a 73 y.o.malePMH of chronic systolic CHF/nonischemic cardiomyopathy Echo 12/13/2016 LVEF 40-45%, COPD on home oxygen, PAF and history of non compliance.   Admitted with A/C respiratory failure and A/C systolic heart failure.  Assessment/Plan   1. Acute/chronic systolic CHF: Nonischemic cardiomyopathy based on cath in 2/20. Echo this admission with EF 45-50% but severe RV dilation/dysfunction. Severe COPD/OHS/OSAon home oxygen is a confounder and likely cause of RV failure/cor pulmonale. TEE with EF 40-45%, moderate LVH, severe RV dilation with moderately decreased systolic function. No central access.  - Hold torsemide this evening. Resume torsemide 80 mg BID tomorrow. AKI will likely correct now that he is taking po.  - Continue Bidil 1/2 tab tid.  - Continue bisoprolol 2.5 mg daily. - He is on clonidine as part of a clinical trial, titrate per protocol.  - Think he would be a good Cardiomems candidateeventually. 2. Atrial fibrillation: Paroxysmal. He went into atrial fibrillation after intubation. On Xarelto at home. - Has not been felt to be a candidate for Tikosyn and amiodarone due to QT prolongation and severe lung disease. - Back on Xarelto.  - Holding NSR s/p TEE/DCCV.  3. Pulmonary: Has COPD, wears home oxygen at baseline. Suspect COPD exacerbation with LLL PNA. WBCs 13.5. Tmax 99. Now treating with ceftazidime for HCAP.  -Continue nebs and steroids per CCM. - Completed course of ceftazidime for HCAP.  - Extubated 01/11/2019. 4. Anemia: H/o GI bleeding. Hemoglobin stable in setting of coffee grounds emesis, now resolved.   - Continue  PPI. - He is back on Xarelto.  5. OHS/OSA: Hypercarbic respiratory failure in setting of OHS/OSA and COPD. OSA diagnosed on sleep study, but having a lot of trouble getting CPAP from the [place]. Will need to see if we can get outside of [place]. Suspect that OHS/OSA plays a large role in his RV failure via hypoxic pulmonary vasoconstriction.  - Vent management per CCM, will need CPAP long-term. 6. Hypernatremia:  - Na 149 this am.  - This should correct now that extubated and taking po.  7. AKI on CKD III - CVP 1.2 -> 1.5 ->  1.18 -> 1.45 8. Deconditioning - PT recommending CIR  Annamaria Helling  01/13/2019 10:37 AM   Advanced Heart Failure Team Pager [phone] (M-F; [place])  Please contact [facility] for night-coverage after hours (4p -7a ) and weekends on [email or web address]  Patient seen with PA, agree with the above note.   Patient stable today, remains in NSR.  Volume status looks ok.  No longer febrile, has finished ceftazidime.    Creatinine up to 1.45, hold evening torsemide and resume torsemide 80 mg bid.     Hemoglobin low but stable.  No overt bleeding.  Continue on Xarelto 20 mg daily.    Hypernatremia improving now that extubated and taking po.   With OHS/OSA, should get set up for CPAP at home. Would continue in hospital.   Mobilize, noted possible plans for inpatient rehab.   Loralie Champagne 01/13/2019 1:33 PM .

## 2019-01-13 NOTE — TOC Initial Note (Signed)
Transition of Care [facility]) - Initial/Assessment Note    Patient Details  Name: Nicholas Caldwell MRN: [id number] Date of Birth: [birth year]  Transition of Care [facility]) CM/SW Contact:    Sherrilyn Rist Phone Number: [phone] 01/13/2019, 10:47 AM  Clinical Narrative:                 CM talked to patient at the bedside with his spouse present. Introduced my role as CM; PCP is Dr [name] at the [facility]; he gets his medication there also; back up pharmacy is [facility] on [address]; DME - home oxygen, walker and cane at home; he does not drive, his wife takes him to his apts/ errands; he is active with [facility] as prior to admission. No need voiced at this time. CM will continue to follow for progression of care.  Expected Discharge Plan: [place] Barriers to Discharge: No Barriers Identified   Patient Goals and CMS Choice Patient states their goals for this hospitalization and ongoing recovery are:: to stay well CMS Medicare.gov Compare Post Acute Care list provided to:: Patient Choice offered to / list presented to : Patient, Spouse  Expected Discharge Plan and Services Expected Discharge Plan: [place] Discharge Planning Services: CM Consult   Living arrangements for the past 2 months: Single Family Home                     HH Arranged: RN, PT, OT [facility] Agency: [facility]  Prior Living Arrangements/Services Living arrangements for the past 2 months: Single Family Home Lives with:: Significant Other Patient language and need for interpreter reviewed:: No Do you feel safe going back to the place where you live?: Yes      Need for Family Participation in Patient Care: No (Comment) Care giver support system in place?: Yes (comment)   Criminal Activity/Legal Involvement Pertinent to Current Situation/Hospitalization: No - Comment as needed  Activities of Daily Living Home Assistive Devices/Equipment: Environmental consultant  (specify type), Eyeglasses, Cane (specify quad or straight) ADL Screening (condition at time of admission) Patient's cognitive ability adequate to safely complete daily activities?: Yes Is the patient deaf or have difficulty hearing?: No Does the patient have difficulty seeing, even when wearing glasses/contacts?: No Does the patient have difficulty concentrating, remembering, or making decisions?: No Patient able to express need for assistance with ADLs?: Yes Does the patient have difficulty dressing or bathing?: No Independently performs ADLs?: Yes (appropriate for developmental age) Communication: Independent Is this a change from baseline?: Pre-admission baseline Dressing (OT): Independent Is this a change from baseline?: Pre-admission baseline Grooming: Independent Is this a change from baseline?: Pre-admission baseline Feeding: Independent Is this a change from baseline?: Pre-admission baseline Bathing: Independent Is this a change from baseline?: Pre-admission baseline Toileting: Independent Is this a change from baseline?: Pre-admission baseline In/Out Bed: Independent Is this a change from baseline?: Pre-admission baseline Walks in Home: Independent with device (comment) Is this a change from baseline?: Pre-admission baseline Does the patient have difficulty walking or climbing stairs?: Yes Weakness of Legs: Both Weakness of Arms/Hands: None  Permission Sought/Granted Permission sought to share information with : Case Manager Permission granted to share information with : Yes, Verbal Permission Granted  Share Information with NAME: Bodie Abernethy spouse  Permission granted to share info w AGENCY: [facility] agency     Permission granted to share info w Contact Information: Felicity Pellegrini  Emotional Assessment Appearance:: Appears stated age Attitude/Demeanor/Rapport: Gracious  Affect (typically observed): Accepting, Calm Orientation: : Oriented to Self, Oriented  to Place, Oriented to  Time, Oriented to Situation Alcohol / Substance Use: Not Applicable Psych Involvement: No (comment)  Admission diagnosis:  Dyspnea, unspecified type [R06.00] Heart failure ([place]) [I50.9] Patient Active Problem List   Diagnosis Date Noted  . Hypoxia   . Altered mental status   . Heart failure ([place]) 12/30/2018  . AKI (acute kidney injury) ([place])   . Acute respiratory failure ([place]) 11/22/2018  . Acute on chronic respiratory failure with hypoxia and hypercapnia ([place]) 11/13/2018  . Metabolic encephalopathy 16/55/3748  . Acute respiratory failure with hypoxia and hypercapnia ([place]) 07/26/2018  . Acute exacerbation of CHF (congestive heart failure) ([place]) 07/26/2018  . CHF exacerbation ([place]) 05/01/2018  . CHF (congestive heart failure) ([place]) 04/30/2018  . Chronic respiratory failure with hypoxia ([place]) 12/28/2017  . Atrial fibrillation with RVR ([place])   . SVT (supraventricular tachycardia) ([place])   . COPD GOLD 0   . Medically noncompliant   . Panlobular emphysema ([place])   . OSA (obstructive sleep apnea)   . Pulmonary hypertension ([place])   . Disorientation   . Pressure injury of skin 09/07/2016  . Acute on chronic combined systolic and diastolic CHF (congestive heart failure) ([place]) 09/05/2016  . Skin lesion-left heal 09/05/2016  . Chronic systolic CHF (congestive heart failure) ([place]) 07/16/2016  . COPD (chronic obstructive pulmonary disease) ([place]) 07/16/2016  . Uncontrolled type 2 diabetes mellitus with complication ([place])   . Diabetic polyneuropathy associated with diabetes mellitus due to underlying condition ([place])   . Normocytic anemia 06/29/2016  . CAD - Non-obstructive by LHC1/16 01/21/2016  . Nonischemic cardiomyopathy ([place]) 10/09/2015  . PAF (paroxysmal atrial fibrillation) ([place])   . Chronic pain 02/19/2015  . Morbid obesity ([place]) 02/13/2015  . Dyspnea   . Elevated troponin I level 11/01/2014  . COPD exacerbation ([place]) 11/01/2014  . Essential hypertension  10/31/2014  . Type 2 diabetes mellitus with neuropathy 10/31/2014  . Hyperlipidemia  10/31/2014  . Cigarette smoker 10/31/2014   PCP:  Clinic, [place]:   [place], [address] [place] [address] Phone: [phone] Fax: [phone]  [facility] of [place], [place] - [address] [place] [address] Phone: [phone] Fax: [phone]     Social Determinants of Health (SDOH) Interventions    Readmission Risk Interventions 30 Day Unplanned Readmission Risk Score     ED to Hosp-Admission (Current) from 12/30/2018 in [place] CHF  30 Day Unplanned Readmission Risk Score (%)  50 Filed at 01/13/2019 0800     This score is the patient's risk of an unplanned readmission within 30 days of being discharged (0 -100%). The score is based on dignosis, age, lab data, medications, orders, and past utilization.   Low:  0-14.9   Medium: 15-21.9   High: 22-29.9   Extreme: 30 and above       No flowsheet data found.

## 2019-01-13 NOTE — Progress Notes (Signed)
OT Cancellation Note  Patient Details Name: Nicholas Caldwell MRN: [id number] DOB: 1945-12-16   Cancelled Treatment:    Reason Eval/Treat Not Completed: Patient declined, no reason specified(just returned to bed with RN staff, requested OT return at later time)  Jaci Carrel 01/13/2019, 2:55 PM   Hulda Humphrey OTR/L [place] Pager: [phone] Office: [phone]

## 2019-01-13 NOTE — Progress Notes (Signed)
Physical Therapy Treatment Patient Details Name: Nicholas Caldwell MRN: [id number] DOB: May 11, 1946 Today's Date: 01/13/2019    History of Present Illness 73 yo male presented with volume overload from acute on chronic biventricular CHF.  Developed progressive hypoxia, hypercapnia, and altered mental status.  Transferred to ICU 3/03 and intubated.  Third hospitalization since January 2020. Intubated 3/3-3/10/20    PT Comments    Pt was seen for follow up to eval, moved him to the chair with RW and cues for safety and use of hands for controlling transfers.  Pt is motivated to try but note significant LE weakness, poor control of posture and low endurance hindering his ability to walk farther.  Follow up acutely for transition to CIR with plan to work on balance, safety with walking device, control of standing esp with endurance and recommend him to follow that with HHPT and assist at home.   Follow Up Recommendations  CIR     Equipment Recommendations  None recommended by PT    Recommendations for Other Services OT consult;Rehab consult     Precautions / Restrictions Precautions Precautions: Fall Precaution Comments: 3-4L O2 [place] baseline Restrictions Weight Bearing Restrictions: No    Mobility  Bed Mobility Overal bed mobility: Needs Assistance Bed Mobility: Supine to Sit     Supine to sit: Mod assist     General bed mobility comments: assisted trunk and then mod to finish scooting to EOB  Transfers Overall transfer level: Needs assistance Equipment used: Rolling walker (2 wheeled);1 person hand held assist Transfers: Sit to/from Stand Sit to Stand: Mod assist         General transfer comment: able to stand x 2 with one trial and sidesteps to chair  Ambulation/Gait Ambulation/Gait assistance: Min guard Gait Distance (Feet): 5 Feet Assistive device: Rolling walker (2 wheeled);4-wheeled walker Gait Pattern/deviations: Trunk flexed Gait velocity: decreased Gait velocity  interpretation: <1.31 ft/sec, indicative of household ambulator General Gait Details: sidesteps to chair with cues for upright posture and controlling balance with RW   Stairs             Wheelchair Mobility    Modified Rankin (Stroke Patients Only)       Balance Overall balance assessment: Needs assistance   Sitting balance-Leahy Scale: Good       Standing balance-Leahy Scale: Poor Standing balance comment: reliant on UE support                            Cognition Arousal/Alertness: Awake/alert Behavior During Therapy: WFL for tasks assessed/performed;Agitated Overall Cognitive Status: Within Functional Limits for tasks assessed                                        Exercises General Exercises - Lower Extremity Long Arc Quad: Strengthening;Both;10 reps Hip Flexion/Marching: Strengthening;10 reps;Both    General Comments General comments (skin integrity, edema, etc.): Pt is demonstrating a limited ability to control standing and B LE's      Pertinent Vitals/Pain Pain Assessment: No/denies pain    Home Living                      Prior Function            PT Goals (current goals can now be found in the care plan section) Acute Rehab PT Goals Patient Stated Goal: return  home Progress towards PT goals: Progressing toward goals    Frequency    Min 3X/week      PT Plan Current plan remains appropriate    Co-evaluation              AM-PAC PT "6 Clicks" Mobility   Outcome Measure  Help needed turning from your back to your side while in a flat bed without using bedrails?: A Little Help needed moving from lying on your back to sitting on the side of a flat bed without using bedrails?: A Little Help needed moving to and from a bed to a chair (including a wheelchair)?: A Lot Help needed standing up from a chair using your arms (e.g., wheelchair or bedside chair)?: A Lot Help needed to walk in hospital  room?: A Lot Help needed climbing 3-5 steps with a railing? : Total 6 Click Score: 13    End of Session Equipment Utilized During Treatment: Oxygen Activity Tolerance: Patient limited by fatigue Patient left: in chair;with call bell/phone within reach;with chair alarm set Nurse Communication: Mobility status PT Visit Diagnosis: Other abnormalities of gait and mobility (R26.89);Muscle weakness (generalized) (M62.81)     Time: 9628-3662 PT Time Calculation (min) (ACUTE ONLY): 28 min  Charges:  $Gait Training: 8-22 mins $Therapeutic Exercise: 8-22 mins                 Ramond Dial 01/13/2019, 3:28 PM  Mee Hives, PT MS Acute Rehab Dept. Number: [place]

## 2019-01-13 NOTE — Progress Notes (Signed)
Report given to Salome, RN and this RN asked her to make the doctor aware of 8 beat run of Vtach that occured this morning at 0645 when the patient's  electrolytes result from this mornings lab draw.  This is why this was not called to MD prior to report because RN was waiting from BMET results to be able to call electrolytes too.

## 2019-01-13 NOTE — Progress Notes (Addendum)
PROGRESS NOTE    Nicholas Caldwell  IDP:[id number] DOB: [birth year] DOA: 12/30/2018 PCP: [facility], [facility]    Brief Narrative:  73 year old male who presented with altered mental status.  He does have significant past medical history for atrial fibrillation, COPD, systolic heart failure and chronic back pain.  His wife found him lethargic and shaky, his blood pressure was very low, EMS was called and patient was brought into the hospital.  On his initial physical examination his blood pressure was 1 11/03/1943, heart rate 69, temperature 98.5, respirate 14, oxygen saturation 99%, on BiPAP.  He was lethargic and somnolent, his lungs had coarse breath sounds bilaterally, heart S1-S2 present rhythmic, abdomen soft nontender, positive bilateral pitting lower extremity edema.  Patient received furosemide and naloxone with no improvement of his symptoms, requiring invasive mechanical ventilation.  He was admitted to the hospital with the working diagnosis of acute on chronic hypoxic/hypercapnic respiratory failure likely due to toxic encephalopathy complicated by acute kidney injury and acute on chronic heart failure.   Assessment & Plan:   Active Problems:   Essential hypertension   Type 2 diabetes mellitus with neuropathy   Dyspnea   PAF (paroxysmal atrial fibrillation) (HCC)   Nonischemic cardiomyopathy (HCC)   CAD - Non-obstructive by LHC1/16   Acute on chronic combined systolic and diastolic CHF (congestive heart failure) (HCC)   Heart failure (HCC)   Altered mental status   Hypoxia   1. Acute on chronic hypoxic/ hypercapnic respiratory failure, complicated with toxic encephalopathy. Patient is more awake and alert, tolerating well supplemental 02 per [place] at 2 lpm, with oxygenation at 94%. Continue neuro checks per unit protocol.  2. Acute on chronic systolic heart failure. Improved volume status, has bee under diuresis with oral torsemide, continue heart failure management with bisoprolol  and bidil. Clinically improved pleural effusions.   3. HTN. Continue blood pressure control with Bidil.  4. Paroxysmal atrial fibrillation. Continue rate control with bisoprolol and anticoagulation with rivaroxaban.   5. T2DM. Continue glucose cover and monitoring with insulin sliding scale, patient is tolerating po well. On chronic prednisone 10 mg daily/   6. Pneumonia (present on admission). Left lower lobe, overall infiltrates have improved with diuresis, patient has been on cefazolin #7, will discontinue antibiotic therapy today.   7. Chronic back and hip pain. Will continue analgesia with oxycodone per home regimen as needed every 6 H, 10 mg.   8. AKI on CKD stage 2, with hypokalemia and hypernatremia. worsening renal function with serum cr at 1,45, with K at 3,3 and serum bicarbonate at 32. Likely related to loop diuretic. Will continue K correction and will hold torsemide for now. Follow on renal panel in am, avoid hypotension and nephrotoxic medications.   DVT prophylaxis: scd  Code Status: full Family Communication: no family at the bedside  Disposition Plan/ discharge barriers: patient medically stable to be transfer to CIR.   Body mass index is 34.92 kg/m. Malnutrition Type:  Nutrition Problem: Inadequate oral intake Etiology: inability to eat   Malnutrition Characteristics:  Signs/Symptoms: NPO status   Nutrition Interventions:  Interventions: Refer to RD note for recommendations  RN Pressure Injury Documentation: Pressure Injury 11/22/18 Stage I -  Intact skin with non-blanchable redness of a localized area usually over a bony prominence. healing pink (Active)  11/22/18 0230  Location: Buttocks  Location Orientation: Right  Staging: Stage I -  Intact skin with non-blanchable redness of a localized area usually over a bony prominence.  Wound Description (Comments): healing  pink  Present on Admission: Yes     Consultants:     Procedures:      Antimicrobials:       Subjective: Patient is feeling better, dyspnea has improved, no chest pain. Persistent and sever right hip pain, worse with movement and reradiated to his back, no improvement with oral analgesics, no associated nausea or vomiting.    Objective: Vitals:   01/13/19 0026 01/13/19 0402 01/13/19 0609 01/13/19 1220  BP: (!) 119/51 (!) 113/49 (!) 101/51 (!) 106/55  Pulse: 72 74 76 75  Resp: 18 18 (!) 21 18  Temp: (!) 97 F (36.1 C) 98.4 F (36.9 C) 99 F (37.2 C) 98.8 F (37.1 C)  TempSrc:  Oral Axillary Oral  SpO2: 92% 91% 94% 94%  Weight:  117.8 kg 116.8 kg   Height:        Intake/Output Summary (Last 24 hours) at 01/13/2019 1401 Last data filed at 01/13/2019 1100 Gross per 24 hour  Intake 1227.62 ml  Output 825 ml  Net 402.62 ml   Filed Weights   01/12/19 0418 01/13/19 0402 01/13/19 0609  Weight: 117.3 kg 117.8 kg 116.8 kg    Examination:   General: Not in pain or dyspnea, deconditioned  Neurology: Awake and alert, non focal  E ENT: mild pallor, no icterus, oral mucosa moist Cardiovascular: No JVD. S1-S2 present, rhythmic, no gallops, rubs, or murmurs. Trace lower extremity edema. Pulmonary: positive breath sounds bilaterally, adequate air movement, no wheezing, rhonchi or rales. Gastrointestinal. Abdomen protuberant with  no organomegaly, non tender, no rebound or guarding Skin. No rashes Musculoskeletal: no joint deformities     Data Reviewed: I have personally reviewed following labs and imaging studies  CBC: Recent Labs  Lab 01/09/19 0434 01/10/19 0403 01/11/19 0409 01/11/19 0440 01/12/19 0425 01/13/19 0554  WBC 13.6* 15.2* 15.6*  --  16.1* 13.5*  HGB 9.1* 9.0* 8.4* 9.9* 7.6* 7.5*  HCT 32.1* 31.7* 30.1* 29.0* 26.8* 26.3*  MCV 89.7 90.8 91.5  --  91.8 88.9  PLT 377 359 346  --  322 161   Basic Metabolic Panel: Recent Labs  Lab 01/06/19 1822 01/07/19 0328 01/07/19 1640  01/09/19 2240 01/10/19 0403 01/11/19 0409 01/11/19  0440 01/12/19 0425 01/13/19 0554  NA  --  140  --    < > 158* 156* 159* 160* 154* 149*  K  --  3.6  --    < > 3.3* 3.4* 3.9 3.8 3.0* 3.3*  CL  --  100  --    < > 114* 113* 115*  --  113* 107  CO2  --  30  --    < > 37* 35* 34*  --  36* 32  GLUCOSE  --  137*  --    < > 154* 248* 200*  --  142* 219*  BUN  --  44*  --    < > 69* 70* 85*  --  65* 55*  CREATININE  --  1.02  --    < > 1.22 1.24 1.51*  --  1.18 1.45*  CALCIUM  --  8.7*  --    < > 9.2 9.2 9.1  --  8.7* 8.5*  MG 2.3 2.3 2.3  --  2.5* 2.6* 2.5*  --  2.4  --   PHOS 3.8 4.0 3.4  --   --   --  3.2  --  3.6  --    < > = values in this interval not displayed.  GFR: Estimated Creatinine Clearance: 62.6 mL/min (A) (by C-G formula based on SCr of 1.45 mg/dL (H)). Liver Function Tests: No results for input(s): AST, ALT, ALKPHOS, BILITOT, PROT, ALBUMIN in the last 168 hours. No results for input(s): LIPASE, AMYLASE in the last 168 hours. No results for input(s): AMMONIA in the last 168 hours. Coagulation Profile: No results for input(s): INR, PROTIME in the last 168 hours. Cardiac Enzymes: No results for input(s): CKTOTAL, CKMB, CKMBINDEX, TROPONINI in the last 168 hours. BNP (last 3 results) No results for input(s): PROBNP in the last 8760 hours. HbA1C: No results for input(s): HGBA1C in the last 72 hours. CBG: Recent Labs  Lab 01/12/19 1526 01/12/19 1700 01/12/19 2123 01/13/19 0645 01/13/19 1136  GLUCAP 182* 298* 199* 224* 239*   Lipid Profile: No results for input(s): CHOL, HDL, LDLCALC, TRIG, CHOLHDL, LDLDIRECT in the last 72 hours. Thyroid Function Tests: No results for input(s): TSH, T4TOTAL, FREET4, T3FREE, THYROIDAB in the last 72 hours. Anemia Panel: No results for input(s): VITAMINB12, FOLATE, FERRITIN, TIBC, IRON, RETICCTPCT in the last 72 hours.    Radiology Studies: I have reviewed all of the imaging during this hospital visit personally     Scheduled Meds: . bisoprolol  2.5 mg Oral Daily  .  cloNIDine  0.1 mg Oral BID   Followed by  . [START ON 01/14/2019] cloNIDine  0.1 mg Oral Daily  . docusate sodium  50 mg Oral BID  . folic acid  1 mg Oral Daily  . gabapentin  600 mg Oral BID  . insulin aspart  0-15 Units Subcutaneous TID WC  . insulin aspart  0-5 Units Subcutaneous QHS  . insulin detemir  15 Units Subcutaneous Daily  . isosorbide-hydrALAZINE  0.5 tablet Oral TID  . latanoprost  1 drop Both Eyes QHS  . mouth rinse  15 mL Mouth Rinse BID  . multivitamin with minerals  1 tablet Oral Daily  . pantoprazole  40 mg Oral BID  . potassium chloride  30 mEq Oral Q4H  . predniSONE  10 mg Oral Q breakfast  . rivaroxaban  20 mg Oral Q supper  . rosuvastatin  20 mg Oral q1800  . sodium chloride flush  3 mL Intravenous Q12H  . [START ON 01/14/2019] torsemide  80 mg Oral BID   Continuous Infusions: . sodium chloride 10 mL/hr at 01/13/19 0500  . sodium chloride Stopped (01/10/19 1414)  . sodium chloride       LOS: 14 days        Elo Marmolejos Gerome Apley, MD

## 2019-01-13 NOTE — Progress Notes (Signed)
Paged Dr. Jimmy Footman to make her aware of blood pressure 101/51, MAP 63 and to ask about Clonidine dose due at 0600.  Order is to hold if systolic <73.

## 2019-01-14 ENCOUNTER — Other Ambulatory Visit: Payer: Self-pay

## 2019-01-14 ENCOUNTER — Inpatient Hospital Stay (HOSPITAL_COMMUNITY)
Admission: RE | Admit: 2019-01-14 | Discharge: 2019-01-24 | DRG: 091 | Disposition: A | Discharge status: Home Health | Payer: Medicare Other | Source: Intra-hospital | Attending: Physical Medicine & Rehabilitation | Admitting: Physical Medicine & Rehabilitation

## 2019-01-14 DIAGNOSIS — Z9981 Dependence on supplemental oxygen: Secondary | ICD-10-CM

## 2019-01-14 DIAGNOSIS — R5381 Other malaise: Secondary | ICD-10-CM | POA: Diagnosis not present

## 2019-01-14 DIAGNOSIS — G92 Toxic encephalopathy: Secondary | ICD-10-CM

## 2019-01-14 DIAGNOSIS — N179 Acute kidney failure, unspecified: Secondary | ICD-10-CM | POA: Diagnosis present

## 2019-01-14 DIAGNOSIS — R2689 Other abnormalities of gait and mobility: Principal | ICD-10-CM | POA: Diagnosis present

## 2019-01-14 DIAGNOSIS — E1165 Type 2 diabetes mellitus with hyperglycemia: Secondary | ICD-10-CM | POA: Diagnosis present

## 2019-01-14 DIAGNOSIS — D72828 Other elevated white blood cell count: Secondary | ICD-10-CM | POA: Diagnosis not present

## 2019-01-14 DIAGNOSIS — D638 Anemia in other chronic diseases classified elsewhere: Secondary | ICD-10-CM | POA: Diagnosis present

## 2019-01-14 DIAGNOSIS — I5023 Acute on chronic systolic (congestive) heart failure: Secondary | ICD-10-CM | POA: Diagnosis present

## 2019-01-14 DIAGNOSIS — E669 Obesity, unspecified: Secondary | ICD-10-CM

## 2019-01-14 DIAGNOSIS — E119 Type 2 diabetes mellitus without complications: Secondary | ICD-10-CM | POA: Diagnosis not present

## 2019-01-14 DIAGNOSIS — M1611 Unilateral primary osteoarthritis, right hip: Secondary | ICD-10-CM | POA: Diagnosis present

## 2019-01-14 DIAGNOSIS — I272 Pulmonary hypertension, unspecified: Secondary | ICD-10-CM | POA: Diagnosis present

## 2019-01-14 DIAGNOSIS — R739 Hyperglycemia, unspecified: Secondary | ICD-10-CM

## 2019-01-14 DIAGNOSIS — I428 Other cardiomyopathies: Secondary | ICD-10-CM | POA: Diagnosis present

## 2019-01-14 DIAGNOSIS — E785 Hyperlipidemia, unspecified: Secondary | ICD-10-CM | POA: Diagnosis present

## 2019-01-14 DIAGNOSIS — E1142 Type 2 diabetes mellitus with diabetic polyneuropathy: Secondary | ICD-10-CM | POA: Diagnosis present

## 2019-01-14 DIAGNOSIS — I2510 Atherosclerotic heart disease of native coronary artery without angina pectoris: Secondary | ICD-10-CM | POA: Diagnosis present

## 2019-01-14 DIAGNOSIS — J449 Chronic obstructive pulmonary disease, unspecified: Secondary | ICD-10-CM | POA: Diagnosis present

## 2019-01-14 DIAGNOSIS — G8929 Other chronic pain: Secondary | ICD-10-CM | POA: Diagnosis present

## 2019-01-14 DIAGNOSIS — I110 Hypertensive heart disease with heart failure: Secondary | ICD-10-CM | POA: Diagnosis present

## 2019-01-14 DIAGNOSIS — Z7984 Long term (current) use of oral hypoglycemic drugs: Secondary | ICD-10-CM

## 2019-01-14 DIAGNOSIS — J9621 Acute and chronic respiratory failure with hypoxia: Secondary | ICD-10-CM | POA: Diagnosis present

## 2019-01-14 DIAGNOSIS — Z794 Long term (current) use of insulin: Secondary | ICD-10-CM

## 2019-01-14 DIAGNOSIS — Z79899 Other long term (current) drug therapy: Secondary | ICD-10-CM

## 2019-01-14 DIAGNOSIS — Z7901 Long term (current) use of anticoagulants: Secondary | ICD-10-CM | POA: Diagnosis not present

## 2019-01-14 DIAGNOSIS — I480 Paroxysmal atrial fibrillation: Secondary | ICD-10-CM | POA: Diagnosis present

## 2019-01-14 DIAGNOSIS — Z87891 Personal history of nicotine dependence: Secondary | ICD-10-CM

## 2019-01-14 DIAGNOSIS — G931 Anoxic brain damage, not elsewhere classified: Secondary | ICD-10-CM | POA: Diagnosis not present

## 2019-01-14 DIAGNOSIS — I10 Essential (primary) hypertension: Secondary | ICD-10-CM | POA: Diagnosis not present

## 2019-01-14 DIAGNOSIS — Z9081 Acquired absence of spleen: Secondary | ICD-10-CM

## 2019-01-14 DIAGNOSIS — E1169 Type 2 diabetes mellitus with other specified complication: Secondary | ICD-10-CM

## 2019-01-14 DIAGNOSIS — E876 Hypokalemia: Secondary | ICD-10-CM

## 2019-01-14 DIAGNOSIS — T380X5A Adverse effect of glucocorticoids and synthetic analogues, initial encounter: Secondary | ICD-10-CM | POA: Diagnosis present

## 2019-01-14 DIAGNOSIS — J9620 Acute and chronic respiratory failure, unspecified whether with hypoxia or hypercapnia: Secondary | ICD-10-CM | POA: Diagnosis present

## 2019-01-14 DIAGNOSIS — Z8249 Family history of ischemic heart disease and other diseases of the circulatory system: Secondary | ICD-10-CM

## 2019-01-14 DIAGNOSIS — Z7951 Long term (current) use of inhaled steroids: Secondary | ICD-10-CM

## 2019-01-14 DIAGNOSIS — I5033 Acute on chronic diastolic (congestive) heart failure: Secondary | ICD-10-CM

## 2019-01-14 DIAGNOSIS — D649 Anemia, unspecified: Secondary | ICD-10-CM | POA: Diagnosis not present

## 2019-01-14 DIAGNOSIS — D72829 Elevated white blood cell count, unspecified: Secondary | ICD-10-CM | POA: Diagnosis present

## 2019-01-14 HISTORY — DX: Acute and chronic respiratory failure, unspecified whether with hypoxia or hypercapnia: J96.20

## 2019-01-14 LAB — BASIC METABOLIC PANEL
Anion gap: 12 (ref 5–15)
BUN: 49 mg/dL — ABNORMAL HIGH (ref 8–23)
CO2: 30 mmol/L (ref 22–32)
Calcium: 8.3 mg/dL — ABNORMAL LOW (ref 8.9–10.3)
Chloride: 105 mmol/L (ref 98–111)
Creatinine, Ser: 1.48 mg/dL — ABNORMAL HIGH (ref 0.61–1.24)
GFR calc Af Amer: 55 mL/min — ABNORMAL LOW (ref >60)
GFR calc non Af Amer: 47 mL/min — ABNORMAL LOW (ref >60)
Glucose, Bld: 268 mg/dL — ABNORMAL HIGH (ref 70–99)
POTASSIUM: 3.3 mmol/L — AB (ref 3.5–5.1)
Sodium: 147 mmol/L — ABNORMAL HIGH (ref 135–145)

## 2019-01-14 LAB — GLUCOSE, CAPILLARY
GLUCOSE-CAPILLARY: 332 mg/dL — AB (ref 70–99)
Glucose-Capillary: 259 mg/dL — ABNORMAL HIGH (ref 70–99)
Glucose-Capillary: 373 mg/dL — ABNORMAL HIGH (ref 70–99)
Glucose-Capillary: 327 mg/dL — ABNORMAL HIGH (ref 70–99)

## 2019-01-14 LAB — PREPARE RBC (CROSSMATCH)

## 2019-01-14 LAB — HEMOGLOBIN AND HEMATOCRIT, BLOOD
HCT: 25.6 % — ABNORMAL LOW (ref 39.0–52.0)
Hemoglobin: 7.3 g/dL — ABNORMAL LOW (ref 13.0–17.0)

## 2019-01-14 MED ORDER — SODIUM CHLORIDE 0.9% IV SOLUTION
Freq: Once | INTRAVENOUS | Status: AC
  Administered 2019-01-14: 14:00:00 via INTRAVENOUS

## 2019-01-14 MED ORDER — RIVAROXABAN 20 MG PO TABS
20.0000 mg | ORAL_TABLET | Freq: Every day | ORAL | Status: DC
  Administered 2019-01-15 – 2019-01-23 (×9): 20 mg via ORAL
  Filled 2019-01-14 (×11): qty 1

## 2019-01-14 MED ORDER — PANTOPRAZOLE SODIUM 40 MG PO TBEC
40.0000 mg | DELAYED_RELEASE_TABLET | Freq: Two times a day (BID) | ORAL | Status: DC
  Administered 2019-01-14 – 2019-01-24 (×20): 40 mg via ORAL
  Filled 2019-01-14 (×20): qty 1

## 2019-01-14 MED ORDER — BISOPROLOL FUMARATE 5 MG PO TABS
2.5000 mg | ORAL_TABLET | Freq: Every day | ORAL | Status: DC
  Administered 2019-01-15 – 2019-01-24 (×9): 2.5 mg via ORAL
  Filled 2019-01-14 (×10): qty 0.5

## 2019-01-14 MED ORDER — ACETAMINOPHEN 160 MG/5ML PO SOLN
650.0000 mg | Freq: Four times a day (QID) | ORAL | Status: DC | PRN
  Administered 2019-01-14 – 2019-01-16 (×2): 650 mg via ORAL
  Filled 2019-01-14 (×2): qty 20.3

## 2019-01-14 MED ORDER — IPRATROPIUM-ALBUTEROL 0.5-2.5 (3) MG/3ML IN SOLN: 3.0000 mL | RESPIRATORY_TRACT | Status: DC | PRN

## 2019-01-14 MED ORDER — INSULIN DETEMIR 100 UNIT/ML ~~LOC~~ SOLN
15.0000 [IU] | Freq: Every day | SUBCUTANEOUS | Status: DC
  Administered 2019-01-15: 15 [IU] via SUBCUTANEOUS
  Filled 2019-01-14: qty 0.15

## 2019-01-14 MED ORDER — POTASSIUM CHLORIDE CRYS ER 20 MEQ PO TBCR
60.0000 meq | EXTENDED_RELEASE_TABLET | Freq: Once | ORAL | Status: AC
  Administered 2019-01-14: 60 meq via ORAL
  Filled 2019-01-14: qty 3

## 2019-01-14 MED ORDER — PREDNISONE 10 MG PO TABS
10.0000 mg | ORAL_TABLET | Freq: Every day | ORAL | Status: DC
  Administered 2019-01-15: 10 mg via ORAL
  Filled 2019-01-14: qty 1

## 2019-01-14 MED ORDER — ISOSORB DINITRATE-HYDRALAZINE 20-37.5 MG PO TABS
0.5000 | ORAL_TABLET | Freq: Three times a day (TID) | ORAL | Status: DC
  Administered 2019-01-14 – 2019-01-24 (×28): 0.5 via ORAL
  Filled 2019-01-14 (×29): qty 1

## 2019-01-14 MED ORDER — INSULIN ASPART 100 UNIT/ML ~~LOC~~ SOLN
0.0000 [IU] | Freq: Three times a day (TID) | SUBCUTANEOUS | Status: DC
  Administered 2019-01-15: 3 [IU] via SUBCUTANEOUS
  Administered 2019-01-15: 8 [IU] via SUBCUTANEOUS
  Administered 2019-01-15: 11 [IU] via SUBCUTANEOUS

## 2019-01-14 MED ORDER — INSULIN ASPART 100 UNIT/ML ~~LOC~~ SOLN
0.0000 [IU] | Freq: Every day | SUBCUTANEOUS | Status: DC
  Administered 2019-01-14: 4 [IU] via SUBCUTANEOUS

## 2019-01-14 MED ORDER — LATANOPROST 0.005 % OP SOLN
1.0000 [drp] | Freq: Every day | OPHTHALMIC | Status: DC
  Administered 2019-01-14 – 2019-01-23 (×10): 1 [drp] via OPHTHALMIC
  Filled 2019-01-14: qty 2.5

## 2019-01-14 MED ORDER — FOLIC ACID 1 MG PO TABS
1.0000 mg | ORAL_TABLET | Freq: Every day | ORAL | Status: DC
  Administered 2019-01-15 – 2019-01-24 (×10): 1 mg via ORAL
  Filled 2019-01-14 (×10): qty 1

## 2019-01-14 MED ORDER — SORBITOL 70 % SOLN
30.0000 mL | Freq: Every day | Status: DC | PRN
  Filled 2019-01-14 (×2): qty 30

## 2019-01-14 MED ORDER — ROSUVASTATIN CALCIUM 20 MG PO TABS
20.0000 mg | ORAL_TABLET | Freq: Every day | ORAL | Status: DC
  Administered 2019-01-15 – 2019-01-23 (×9): 20 mg via ORAL
  Filled 2019-01-14 (×9): qty 1

## 2019-01-14 MED ORDER — TORSEMIDE 20 MG PO TABS: 80.0000 mg | ORAL_TABLET | Freq: Two times a day (BID) | ORAL | Status: DC

## 2019-01-14 MED ORDER — OXYCODONE-ACETAMINOPHEN 5-325 MG PO TABS
2.0000 | ORAL_TABLET | Freq: Four times a day (QID) | ORAL | Status: DC | PRN
  Administered 2019-01-14 – 2019-01-24 (×32): 2 via ORAL
  Filled 2019-01-14 (×34): qty 2

## 2019-01-14 MED ORDER — BISACODYL 10 MG RE SUPP: 10.0000 mg | Freq: Every day | RECTAL | Status: DC | PRN

## 2019-01-14 MED ORDER — TORSEMIDE 20 MG PO TABS
80.0000 mg | ORAL_TABLET | Freq: Two times a day (BID) | ORAL | Status: DC
  Administered 2019-01-14: 80 mg via ORAL
  Filled 2019-01-14 (×2): qty 4

## 2019-01-14 MED ORDER — GABAPENTIN 600 MG PO TABS
600.0000 mg | ORAL_TABLET | Freq: Two times a day (BID) | ORAL | Status: DC
  Administered 2019-01-14 – 2019-01-24 (×20): 600 mg via ORAL
  Filled 2019-01-14 (×20): qty 1

## 2019-01-14 MED ORDER — ADULT MULTIVITAMIN W/MINERALS CH
1.0000 | ORAL_TABLET | Freq: Every day | ORAL | Status: DC
  Administered 2019-01-15 – 2019-01-24 (×10): 1 via ORAL
  Filled 2019-01-14 (×10): qty 1

## 2019-01-14 NOTE — TOC Transition Note (Signed)
Transition of Care [facility]) - CM/SW Discharge Note   Patient Details  Name: Nicholas Caldwell MRN: [id number] Date of Birth: Jul 11, 1946  [facility]) CM/SW Contact:  Royston Bake, RN Phone Number: 01/14/2019, 2:41 PM   Clinical Narrative:    Patient discharging to Inpatient Rehab, CM signing off; B Pennie Rushing   Final next level of care: IP Rehab Facility Barriers to Discharge: No Barriers Identified   Patient Goals and CMS Choice Patient states their goals for this hospitalization and ongoing recovery are:: to stay well CMS Medicare.gov Compare Post Acute Care list provided to:: Patient Choice offered to / list presented to : Patient, Spouse  Discharge Placement  [facility] Inpt Rehab                     Discharge Plan and Services Discharge Planning Services: CM Consult                HH Arranged: RN, PT, OT [facility] Agency: [facility] as prior to admission- pt qualifies for Inpt Rehab prior to discharging home; [facility] agency can support the patient at home after short term [place] stay.   Social Determinants of Health (SDOH) Interventions     Readmission Risk Interventions No flowsheet data found.

## 2019-01-14 NOTE — H&P (Signed)
Physical Medicine and Rehabilitation Admission H&P        Chief Complaint  Patient presents with  . Shortness of Breath  : Chief complaint: Weakness   HPI: Nicholas Caldwell is a 73 year old right-handed male history of atrial flutter/PAF and continues on Xarelto, CAD,chronic back pain maintained on Neurontin as well as oxycodone as needed,chronic anemia, chronic diastolic congestive heart failure, diabetes mellitus maintained on Glucophage 1000 mg twice a day and NovoLog 70/30, hypertension maintained on Bidil, COPD/tobacco abuse and used oxygen at home. Per chart review patient lives with spouse. One level home with one step to entry. Used a rolling walker for mobility. Wife did assist with some lower body ADLs. Presented 12/30/2018 with increasing shortness of breath and approximately 12 pounds of weight gain over the last 4 days. Increase edema lower extremities. Chest x-ray showed no interval change and congestive heart failure with edema and bilateral effusions. CT the chest showed small right pleural effusion as well as several small pulmonary nodules largest pulmonary nodule measuring 8 mm in the left upper lobe noncontrast CT of the chest recommended 3-6 months. Ultrasound the abdomen negative for significant ascites. Urine drug screen was positive for opiates and benzos. Acute on chronic anemia hemoglobin 8.7. EKG atrial fibrillationwith history of PAF maintained on Xarelto. Follow-up cardiology services. Patient did require DCCV 01/10/2019 and remained on intravenous heparin for a short time. Noted with ongoing acute respiratory failure related to CHF requiring intubation 01/04/2019 through 01/11/2019.  Diet has been advanced to regular consistency.therapy evaluations completed with recommendations of physical medicine rehabilitation consult. Patient was admitted for a comprehensive rehabilitation program.   Review of Systems  Constitutional:       Weight gain 10-12 pounds over 4-5 days   HENT: Negative for hearing loss.   Eyes: Negative for blurred vision and double vision.  Respiratory: Positive for shortness of breath. Negative for cough and wheezing.   Cardiovascular: Positive for chest pain, palpitations and leg swelling.  Gastrointestinal: Positive for constipation. Negative for heartburn, nausea and vomiting.  Genitourinary: Positive for urgency. Negative for dysuria, flank pain and hematuria.  Musculoskeletal: Positive for back pain.  Skin: Negative for rash.  Psychiatric/Behavioral: The patient has insomnia.         Past Medical History:  Diagnosis Date  . Atrial flutter ([place])      a. recurrent AFlutter with RVR;  b. Amiodarone Rx started 4/16  . CAD (coronary artery disease)      a. LHC 1/16:  mLAD diffuse disease, pLCx mild disease, dLCx with disease but too small for PCI, RCA ok, EF 25-30%  . Chronic pain    . Chronic systolic CHF (congestive heart failure) ([place])    . COPD (chronic obstructive pulmonary disease) ([place])    . Diabetes mellitus without complication ([place])    . Hypercholesteremia    . Hypertension    . NICM (nonischemic cardiomyopathy) ([place])      a.dx 2016. b. 2D echo 06/2016 - Last echo 07/01/16: mod dilated LV, mod LVH, EF 25-30%, mild-mod MR, sev LAE, mild-mod reduced RV systolic function, mild-mod TR, PASP 80mHG.  .Marland KitchenPAF (paroxysmal atrial fibrillation) ([facility])      On amio - ot a candidate for flecainide due to cardiomyopathy, not a candidate for Tikosyn due to prolonged QT, and felt to be a poor candidate for ablation given left atrial size.  . Pulmonary hypertension ([place]    . Tobacco abuse      Past Surgical History:  Procedure Laterality Date  . CARDIOVERSION N/A 07/30/2018    Procedure: CARDIOVERSION;  Surgeon: Larey Dresser, MD;  Location: [facility] ENDOSCOPY;  Service: Cardiovascular;  Laterality: N/A;  . LEFT AND RIGHT HEART CATHETERIZATION WITH CORONARY ANGIOGRAM N/A 11/06/2014    Procedure: LEFT AND RIGHT HEART CATHETERIZATION WITH  CORONARY ANGIOGRAM;  Surgeon: Jettie Booze, MD;  Location: [facility] CATH LAB;  Service: Cardiovascular;  Laterality: N/A;  . RIGHT/LEFT HEART CATH AND CORONARY ANGIOGRAPHY N/A 12/06/2018    Procedure: RIGHT/LEFT HEART CATH AND CORONARY ANGIOGRAPHY;  Surgeon: Larey Dresser, MD;  Location: [facility] CV LAB;  Service: Cardiovascular;  Laterality: N/A;  . SPLENECTOMY             Family History  Problem Relation Age of Onset  . Heart disease Mother    . Hypertension Mother    . Heart failure Mother    . Heart disease Father    . Hypertension Sister    . Heart attack Neg Hx    . Stroke Neg Hx      Social History:  reports that he quit smoking about 6 weeks ago. His smoking use included cigarettes. He has a 34.00 pack-year smoking history. He has never used smokeless tobacco. He reports that he does not drink alcohol or use drugs. Allergies: No Known Allergies       Medications Prior to Admission  Medication Sig Dispense Refill  . bisoprolol (ZEBETA) 5 MG tablet Take 0.5 tablets (2.5 mg total) by mouth daily for 30 days. 15 tablet 3  . budesonide-formoterol (SYMBICORT) 160-4.5 MCG/ACT inhaler Inhale 2 puffs into the lungs 2 (two) times daily.      . diazepam (VALIUM) 10 MG tablet Take 0.5 tablets (5 mg total) by mouth daily as needed for anxiety or sleep. (Patient taking differently: Take 10 mg by mouth daily as needed for anxiety or sleep. ) 30 tablet 0  . ferrous sulfate 325 (65 FE) MG tablet Take 325 mg by mouth 3 (three) times daily as needed (for supplementation).      . fluticasone (FLONASE) 50 MCG/ACT nasal spray Place 2 sprays into both nostrils daily as needed for allergies.       . folic acid (FOLVITE) 1 MG tablet Take 1 mg by mouth daily.      Marland Kitchen gabapentin (NEURONTIN) 600 MG tablet Take 1 tablet (600 mg total) by mouth 2 (two) times daily. 60 tablet 0  . glucose 4 GM chewable tablet Chew 4 tablets by mouth See admin instructions. Chew 4 tablets by mouth as needed for low blood  sugar - repeat every 15 minutes if blood sugar less than 70      . guaifenesin (HUMIBID E) 400 MG TABS tablet Take 1 tablet (400 mg total) by mouth every 6 (six) hours as needed. (Patient taking differently: Take 400 mg by mouth every 6 (six) hours as needed (cough). ) 10 tablet 0  . hydrocortisone (ANUSOL-HC) 2.5 % rectal cream Place 1 application rectally 2 (two) times daily as needed for hemorrhoids or itching.       . hydrocortisone 1 % lotion Apply 1 application topically See admin instructions. Apply small amount to back twice daily for itchy rash      . insulin aspart protamine - aspart (NOVOLOG MIX 70/30 FLEXPEN) (70-30) 100 UNIT/ML FlexPen Inject 0.05 mLs (5 Units total) into the skin 2 (two) times daily. (Patient taking differently: Inject 30 Units into the skin 2 (two) times daily. ) 15 mL 11  .  ipratropium-albuterol (DUONEB) 0.5-2.5 (3) MG/3ML SOLN Take 3 mLs by nebulization 2 (two) times daily.      . isosorbide-hydrALAZINE (BIDIL) 20-37.5 MG tablet Take 1 tablet by mouth 3 (three) times daily. 90 tablet 3  . latanoprost (XALATAN) 0.005 % ophthalmic solution Place 1 drop into both eyes at bedtime.      . Magnesium Oxide 420 (252 Mg) MG TABS Take 420 mg by mouth 2 (two) times daily.      . metFORMIN (GLUCOPHAGE-XR) 500 MG 24 hr tablet Take 1,000 mg by mouth 2 (two) times daily with a meal.      . oxyCODONE-acetaminophen (PERCOCET) 10-325 MG tablet Take 1 tablet by mouth every 6 (six) hours as needed for pain.      . OXYGEN Inhale 3 L into the lungs continuous.       . potassium chloride SA (K-DUR,KLOR-CON) 20 MEQ tablet Take 2 tablets (40 mEq total) by mouth every morning AND 1 tablet (20 mEq total) at bedtime. (Patient taking differently: Take 2 tablets (40 mEq total) by mouth every morning  AND 1 tablet (20 mEq total) at bedtime.) 90 tablet 0  . rivaroxaban (XARELTO) 20 MG TABS tablet Take 1 tablet (20 mg total) by mouth daily with supper. (Patient taking differently: Take 20 mg by mouth  daily at 6 PM. ) 30 tablet 4  . rosuvastatin (CRESTOR) 20 MG tablet Take 1 tablet (20 mg total) by mouth daily. 30 tablet 0  . torsemide (DEMADEX) 20 MG tablet Take 4 tablets (80 mg total) by mouth 2 (two) times daily for 30 days. 240 tablet 0      Drug Regimen Review Drug regimen was reviewed and remains appropriate with no significant issues identified   Home: Home Living Family/patient expects to be discharged to:: Private residence Living Arrangements: Spouse/significant other Available Help at Discharge: Family, Available 24 hours/day Type of Home: House Home Access: Stairs to enter [employer] of Steps: 1 Entrance Stairs-Rails: None Home Layout: One level Bathroom Shower/Tub: Chiropodist: Standard Bathroom Accessibility: Yes Home Equipment: Environmental consultant - 2 wheels, Cane - single point, Bedside commode, Shower seat Additional Comments: on 3L of O2 at all times at home  Lives With: (P) Spouse   Functional History: Prior Function Level of Independence: (P) Independent with assistive device(s) Gait / Transfers Assistance Needed: uses RW for mobility ADL's / Homemaking Assistance Needed: assist from wife for LB ADL and wife does IADL Comments: denies falls between [place]. was working with HHPT   Functional Status:  Mobility: Bed Mobility Overal bed mobility: Needs Assistance Bed Mobility: Supine to Sit Supine to sit: Mod assist Sit to supine: Max assist General bed mobility comments: sitting EOB when OT entered Transfers Overall transfer level: Needs assistance Equipment used: Rolling walker (2 wheeled), 1 person hand held assist Transfers: Sit to/from Stand, Stand Pivot Transfers Sit to Stand: Min assist, From elevated surface Stand pivot transfers: Mod assist, +2 physical assistance, +2 safety/equipment, From elevated surface General transfer comment: initially from elevated EOB required mod A +2 with HHA, from [facility] Pt min A woth good hand  placement Ambulation/Gait Ambulation/Gait assistance: Min guard Gait Distance (Feet): 5 Feet Assistive device: Rolling walker (2 wheeled), 4-wheeled walker Gait Pattern/deviations: Trunk flexed General Gait Details: sidesteps to chair with cues for upright posture and controlling balance with RW Gait velocity: decreased Gait velocity interpretation: <1.31 ft/sec, indicative of household ambulator   ADL: ADL Overall ADL's : Needs assistance/impaired Eating/Feeding: Set up, Sitting Grooming: Set up,  Sitting Upper Body Bathing: Sitting, Moderate assistance Lower Body Bathing: Maximal assistance, Sit to/from stand Upper Body Dressing : Minimal assistance, Sitting Upper Body Dressing Details (indicate cue type and reason): to don hospital gown Lower Body Dressing: Maximal assistance, Sit to/from stand Lower Body Dressing Details (indicate cue type and reason): Pt interested in AE for LB ADL - "the plastic bag kit from the store here" encouraged to purchase, and bring up to room so that we can practice with his equipment Toilet Transfer: Moderate assistance, +2 for physical assistance, +2 for safety/equipment, Stand-pivot, BSC Toilet Transfer Details (indicate cue type and reason): without DME Pt required significantly increase assist for transfer Toileting- Clothing Manipulation and Hygiene: Maximal assistance, Sit to/from stand Toileting - Clothing Manipulation Details (indicate cue type and reason): for rear peri care - Pt bent over on walker Functional mobility during ADLs: Minimal assistance, Rolling walker General ADL Comments: pt stating he would be purchasing AE for LB ADL prior to going home, recalled use from previous OT visit   Cognition: Cognition Overall Cognitive Status: Within Functional Limits for tasks assessed Orientation Level: Oriented X4 Cognition Arousal/Alertness: Awake/alert Behavior During Therapy: WFL for tasks assessed/performed, Agitated Overall Cognitive  Status: Within Functional Limits for tasks assessed Area of Impairment: Safety/judgement, Awareness, Problem solving Current Attention Level: Selective Following Commands: Follows one step commands with increased time Safety/Judgement: Decreased awareness of safety, Decreased awareness of deficits Problem Solving: Slow processing, Requires verbal cues General Comments: patient with por insight, wanting to walk hallway tonight when max A to stand and too weak to step off bed.    Physical Exam: Blood pressure (!) 96/37, pulse 76, temperature (!) 97.5 F (36.4 C), resp. rate 18, height 6' (1.829 m), weight 116.6 kg, SpO2 94 %. Physical Exam  Constitutional: He appears well-developed and well-nourished. No distress.  obese  HENT:  Head: Normocephalic.  Eyes: Pupils are equal, round, and reactive to light. EOM are normal.  Neck: Normal range of motion. No tracheal deviation present. No thyromegaly present.  Cardiovascular: Normal rate and regular rhythm.  No murmur heard. Respiratory: Effort normal and breath sounds normal. No respiratory distress. He has no wheezes. He has no rales.  2l Oxygen via [place]  GI: Soft. Bowel sounds are normal. He exhibits no distension. There is no abdominal tenderness. There is no rebound.  Musculoskeletal: Normal range of motion.        General: No edema.  Neurological:  Pt oriented to person, place, month, year. Fair insight and awareness. Slow processing more complex info. Provided some biographical information. UE 3+ to 4/5 prox to distal. LE: 3/ 5 HF, KE and 4/5 ADF/PF. No focal sensory findings other than perhaps decreased PP in both feet.  DTR's 1+.     Skin:  Some chronic scars and vascular changes to limbs.   Psychiatric: He has a normal mood and affect. His behavior is normal. Thought content normal.      Lab Results Last 48 Hours        Results for orders placed or performed during the hospital encounter of 12/30/18 (from the past 48 hour(s))   Glucose, capillary     Status: Abnormal    Collection Time: 01/12/19 11:11 AM  Result Value Ref Range    Glucose-Capillary 182 (H) 70 - 99 mg/dL  Glucose, capillary     Status: Abnormal    Collection Time: 01/12/19  3:26 PM  Result Value Ref Range    Glucose-Capillary 182 (H) 70 - 99 mg/dL  Glucose, capillary     Status: Abnormal    Collection Time: 01/12/19  5:00 PM  Result Value Ref Range    Glucose-Capillary 298 (H) 70 - 99 mg/dL  Glucose, capillary     Status: Abnormal    Collection Time: 01/12/19  9:23 PM  Result Value Ref Range    Glucose-Capillary 199 (H) 70 - 99 mg/dL    Comment 1 Notify RN      Comment 2 Document in Chart    CBC     Status: Abnormal    Collection Time: 01/13/19  5:54 AM  Result Value Ref Range    WBC 13.5 (H) 4.0 - 10.5 K/uL    RBC 2.96 (L) 4.22 - 5.81 MIL/uL    Hemoglobin 7.5 (L) 13.0 - 17.0 g/dL    HCT 26.3 (L) 39.0 - 52.0 %    MCV 88.9 80.0 - 100.0 fL    MCH 25.3 (L) 26.0 - 34.0 pg    MCHC 28.5 (L) 30.0 - 36.0 g/dL    RDW 19.6 (H) 11.5 - 15.5 %    Platelets 292 150 - 400 K/uL    nRBC 0.2 0.0 - 0.2 %      Comment: Performed at [facility], [address], [place], [place] [address]  Basic metabolic panel     Status: Abnormal    Collection Time: 01/13/19  5:54 AM  Result Value Ref Range    Sodium 149 (H) 135 - 145 mmol/L    Potassium 3.3 (L) 3.5 - 5.1 mmol/L    Chloride 107 98 - 111 mmol/L    CO2 32 22 - 32 mmol/L    Glucose, Bld 219 (H) 70 - 99 mg/dL    BUN 55 (H) 8 - 23 mg/dL    Creatinine, Ser 1.45 (H) 0.61 - 1.24 mg/dL    Calcium 8.5 (L) 8.9 - 10.3 mg/dL    GFR calc non Af Amer 48 (L) >60 mL/min    GFR calc Af Amer 56 (L) >60 mL/min    Anion gap 10 5 - 15      Comment: Performed at [facility] [address], [place], [place] [address]  Glucose, capillary     Status: Abnormal    Collection Time: 01/13/19  6:45 AM  Result Value Ref Range    Glucose-Capillary 224 (H) 70 - 99 mg/dL    Comment 1 Notify RN      Comment 2  Document in Chart    Glucose, capillary     Status: Abnormal    Collection Time: 01/13/19 11:36 AM  Result Value Ref Range    Glucose-Capillary 239 (H) 70 - 99 mg/dL    Comment 1 Notify RN      Comment 2 Document in Chart    Glucose, capillary     Status: Abnormal    Collection Time: 01/13/19  4:31 PM  Result Value Ref Range    Glucose-Capillary 246 (H) 70 - 99 mg/dL    Comment 1 Notify RN      Comment 2 Document in Chart    Glucose, capillary     Status: Abnormal    Collection Time: 01/13/19  9:01 PM  Result Value Ref Range    Glucose-Capillary 271 (H) 70 - 99 mg/dL  Basic metabolic panel     Status: Abnormal    Collection Time: 01/14/19  5:06 AM  Result Value Ref Range    Sodium 147 (H) 135 - 145 mmol/L  Potassium 3.3 (L) 3.5 - 5.1 mmol/L    Chloride 105 98 - 111 mmol/L    CO2 30 22 - 32 mmol/L    Glucose, Bld 268 (H) 70 - 99 mg/dL    BUN 49 (H) 8 - 23 mg/dL    Creatinine, Ser 1.48 (H) 0.61 - 1.24 mg/dL    Calcium 8.3 (L) 8.9 - 10.3 mg/dL    GFR calc non Af Amer 47 (L) >60 mL/min    GFR calc Af Amer 55 (L) >60 mL/min    Anion gap 12 5 - 15      Comment: Performed at [facility] [address], [place], [place] [address]  Glucose, capillary     Status: Abnormal    Collection Time: 01/14/19  6:00 AM  Result Value Ref Range    Glucose-Capillary 259 (H) 70 - 99 mg/dL      Imaging Results (Last 48 hours)  No results found.           Medical Problem List and Plan: 1.  Decreased functional mobility secondary to acute on chronic hypoxic respiratory failure complicated by toxic encephalopathy              -admit to inpatient rehab 2.  Antithrombotics: -DVT/anticoagulation:  Xarelto             -antiplatelet therapy: see above 3. Pain Management:  Neurontin 600 mg twice a day, oxycodone as needed 4. Mood:  Provide emotional support             -antipsychotic agents: None 5. Neuropsych: This patient is capable of making decisions on his own behalf. 6.  Skin/Wound Care:  Routine skin checks 7. Fluids/Electrolytes/Nutrition:  Routine and out's with follow-up chemistries 8. PAF. Cardiac rate controlled. Follow cardiology services. Continue Xarelto 9. Hypertension. Zebeta 2.5 mg daily,Bidil 20-37.5 mg 3 times a day 10. Acute on chronicc systolic congestive heart failure. Demadex 80 mg twice a day. Monitor for any signs of fluid overload 11. Tobacco abuse/COPD. Patient on 3 L oxygen prior to admission. Continue low-dose prednisone. Nebulizer treatments every 4 hours as needed 12. Diabetes mellitus peripheral neuropathy. Levemir 15 units daily. Check blood sugars before meals and at bedtime 13. Hyperlipidemia. Crestor     Cathlyn Parsons, PA-C 01/14/2019  Post Admission Physician Evaluation: 1. Functional deficits secondary  to debility/encephalopathy. 2. Patient is admitted to receive collaborative, interdisciplinary care between the physiatrist, rehab nursing staff, and therapy team. 3. Patient's level of medical complexity and substantial therapy needs in context of that medical necessity cannot be provided at a lesser intensity of care such as a SNF. 4. Patient has experienced substantial functional loss from his/her baseline which was documented above under the "Functional History" and "Functional Status" headings.  Judging by the patient's diagnosis, physical exam, and functional history, the patient has potential for functional progress which will result in measurable gains while on inpatient rehab.  These gains will be of substantial and practical use upon discharge  in facilitating mobility and self-care at the household level. 5. Physiatrist will provide 24 hour management of medical needs as well as oversight of the therapy plan/treatment and provide guidance as appropriate regarding the interaction of the two. 6. The Preadmission Screening has been reviewed and patient status is unchanged unless otherwise stated above. 7. 24 hour rehab  nursing will assist with bladder management, bowel management, safety, skin/wound care, disease management, medication administration, pain management and patient education  and help integrate therapy concepts,  techniques,education, etc. 8. PT will assess and treat for/with: Lower extremity strength, range of motion, stamina, balance, functional mobility, safety, adaptive techniques and equipment, NMR, family ed.   Goals are: supervision to min assist. 9. OT will assess and treat for/with: ADL's, functional mobility, safety, upper extremity strength, adaptive techniques and equipment, NMR, family ed.   Goals are: supervision to min assist. Therapy may proceed with showering this patient. 10. SLP will assess and treat for/with: n/a.  Goals are: n/a. 11. Case Management and Social Worker will assess and treat for psychological issues and discharge planning. 12. Team conference will be held weekly to assess progress toward goals and to determine barriers to discharge. 13. Patient will receive at least 3 hours of therapy per day at least 5 days per week. 14. ELOS: 14-20 days       15. Prognosis:  excellent   I have personally performed a face to face diagnostic evaluation of this patient and formulated the key components of the plan.  Additionally, I have personally reviewed laboratory data, imaging studies, as well as relevant notes and concur with the physician assistant's documentation above.  Meredith Staggers, MD, [facility]

## 2019-01-14 NOTE — Evaluation (Signed)
Occupational Therapy re-Evaluation Patient Details Name: Nicholas Caldwell MRN: [id number] DOB: [birth year] Today's Date: 01/14/2019    History of Present Illness 73 yo male presented with volume overload from acute on chronic biventricular CHF.  Developed progressive hypoxia, hypercapnia, and altered mental status.  Transferred to ICU 3/03 and intubated.  Third hospitalization since January 2020. Intubated 3/3-3/10/20   Clinical Impression   Pt re-evaluated after previous order cancelled. Today initially patient was mod A +2 for HHA for Stand pivot transfer to [facility], performed LB bathing at max A, grooming at set up while on BSC. Pt was able to stand a second time from [facility] with min A with RW. Pt then did short in room mobility with 2 standing rest breaks, noticeable SOB (nO O2 reading on pulse-ox) Due to increased need for assist, generalized weakness, and potential to return to PLOF recommending continued skilled OT acutely as well as CIR level therapy to maximize safety and independence in ADL and functional transfers.     Follow Up Recommendations  CIR;Supervision/Assistance - 24 hour    Equipment Recommendations  Other (comment)(defer to next venue)    Recommendations for Other Services       Precautions / Restrictions Precautions Precautions: Fall Precaution Comments: 3-4L O2 [place] baseline Restrictions Weight Bearing Restrictions: No      Mobility Bed Mobility               General bed mobility comments: sitting EOB when OT entered  Transfers Overall transfer level: Needs assistance Equipment used: Rolling walker (2 wheeled);1 person hand held assist Transfers: Sit to/from [facility] Sit to Stand: Min assist;From elevated surface Stand pivot transfers: Mod assist;+2 physical assistance;+2 safety/equipment;From elevated surface       General transfer comment: initially from elevated EOB required mod A +2 with HHA, from [facility] Pt min A woth good hand  placement    Balance Overall balance assessment: Needs assistance Sitting-balance support: Feet supported;Bilateral upper extremity supported Sitting balance-Leahy Scale: Good     Standing balance support: Bilateral upper extremity supported Standing balance-Leahy Scale: Poor Standing balance comment: dependent on BUE support                           ADL either performed or assessed with clinical judgement   ADL Overall ADL's : Needs assistance/impaired Eating/Feeding: Set up;Sitting   Grooming: Set up;Sitting   Upper Body Bathing: Sitting;Moderate assistance   Lower Body Bathing: Maximal assistance;Sit to/from stand   Upper Body Dressing : Minimal assistance;Sitting Upper Body Dressing Details (indicate cue type and reason): to don hospital gown Lower Body Dressing: Maximal assistance;Sit to/from stand   Toilet Transfer: Moderate assistance;+2 for physical assistance;+2 for safety/equipment;Stand-pivot;BSC Toilet Transfer Details (indicate cue type and reason): without DME Pt required significantly increase assist for transfer Toileting- Clothing Manipulation and Hygiene: Maximal assistance;Sit to/from stand Toileting - Clothing Manipulation Details (indicate cue type and reason): for rear peri care - Pt bent over on walker     Functional mobility during ADLs: Minimal assistance;Rolling walker       Vision Baseline Vision/History: Wears glasses Patient Visual Report: No change from baseline       Perception     Praxis      Pertinent Vitals/Pain Pain Assessment: Faces Faces Pain Scale: Hurts even more Pain Location: right neck (sutures) Pain Descriptors / Indicators: Grimacing Pain Intervention(s): Monitored during session;Repositioned;RN gave pain meds during session     Hand Dominance Right  Extremity/Trunk Assessment Upper Extremity Assessment Upper Extremity Assessment: Generalized weakness   Lower Extremity Assessment Lower Extremity  Assessment: Generalized weakness   Cervical / Trunk Assessment Cervical / Trunk Assessment: Other exceptions Cervical / Trunk Exceptions: Increased body habitus   Communication Communication Communication: No difficulties   Cognition Arousal/Alertness: Awake/alert Behavior During Therapy: WFL for tasks assessed/performed;Agitated Overall Cognitive Status: Within Functional Limits for tasks assessed                                     General Comments  wife present throughout session    Exercises     Shoulder Instructions      Home Living Family/patient expects to be discharged to:: Private residence Living Arrangements: Spouse/significant other Available Help at Discharge: Family;Available 24 hours/day Type of Home: House Home Access: Stairs to enter [employer] of Steps: 1 Entrance Stairs-Rails: None Home Layout: One level     Bathroom Shower/Tub: Teacher, early years/pre: Standard Bathroom Accessibility: Yes   Home Equipment: Environmental consultant - 2 wheels;Cane - single point;Bedside commode;Shower seat   Additional Comments: on 3L of O2 at all times at home      Prior Functioning/Environment Level of Independence: Needs assistance  Gait / Transfers Assistance Needed: uses RW for mobility ADL's / Homemaking Assistance Needed: assist from wife for LB ADL and wife does IADL   Comments: denies falls between admssions. was working with HHPT        OT Problem List: Decreased strength;Decreased range of motion;Decreased activity tolerance;Impaired balance (sitting and/or standing);Decreased knowledge of use of DME or AE;Decreased knowledge of precautions;Pain;Obesity      OT Treatment/Interventions: Self-care/ADL training;Therapeutic exercise;Energy conservation;DME and/or AE instruction;Therapeutic activities;Patient/family education    OT Goals(Current goals can be found in the care plan section) Acute Rehab OT Goals Patient Stated Goal:  get to CIR to walk better OT Goal Formulation: With patient/family Time For Goal Achievement: 01/28/19 Potential to Achieve Goals: Good  OT Frequency: Min 2X/week   Barriers to D/C:            Co-evaluation              AM-PAC OT "6 Clicks" Daily Activity     Outcome Measure Help from another person eating meals?: None Help from another person taking care of personal grooming?: A Lot Help from another person toileting, which includes using toliet, bedpan, or urinal?: A Lot Help from another person bathing (including washing, rinsing, drying)?: A Lot Help from another person to put on and taking off regular upper body clothing?: A Little Help from another person to put on and taking off regular lower body clothing?: A Lot 6 Click Score: 15   End of Session Equipment Utilized During Treatment: Oxygen(3L via [place]) Nurse Communication: Mobility status;Precautions(condom cath is not on right, tele battery replaced)  Activity Tolerance: Patient tolerated treatment well Patient left: in chair;with call bell/phone within reach;with family/visitor present;with chair alarm set  OT Visit Diagnosis: Unsteadiness on feet (R26.81);Other abnormalities of gait and mobility (R26.89);Muscle weakness (generalized) (M62.81);Pain Pain - Right/Left: Right Pain - part of body: Hip(neck)                Time: 4818-5631 OT Time Calculation (min): 28 min Charges:  OT General Charges $OT Visit: 1 Visit OT Evaluation $OT Re-eval: 1 Re-eval OT Treatments $Self Care/Home Management : 8-22 mins  Hulda Humphrey OTR/L [place] Pager: [phone]  Office: [place] 01/14/2019, 9:27 AM

## 2019-01-14 NOTE — Discharge Summary (Addendum)
Physician Discharge Summary  [name] IOE:[id number] DOB: 1945/12/12 DOA: 12/30/2018  PCP: [facility]  Admit date: 12/30/2018 Discharge date: 01/14/2019  Admitted From: Home  Disposition:  CIR  Recommendations for Outpatient Follow-up and new medication changes:  1. Follow up with [facility] at discharge.  2. Follow cell count 01/17/2019 3. Follow renal panel in 01/17/19   Home Health: NA Equipment/Devices: NA    Discharge Condition: stable  CODE STATUS: full  Diet recommendation: heart healthy and diabetic prudent.   Brief/Interim Summary: 73 year old male who presented with altered mental status.  He does have significant past medical history for atrial fibrillation, COPD, systolic heart failure and chronic back pain.  His wife found him lethargic and shaking at home, his blood pressure was very low, EMS was called and patient was brought into the hospital.  On his initial physical examination his blood pressure was 101/47, heart rate 69, temperature 98.5, respiratory rate 14, oxygen saturation 99%, on BiPAP.  He was lethargic and somnolent, his lungs had coarse breath sounds bilaterally, heart S1-S2 present rhythmic, abdomen soft nontender, positive bilateral pitting lower extremity edema.  Patient received furosemide and naloxone with no improvement of his symptoms, then requiring invasive mechanical ventilation.  On admission his sodium was 139, potassium 4.9, chloride 96, bicarb 32, glucose 147, BUN 25, creatinine 1.2, BNP 1273.  White cell count 7.9, hemoglobin 9.0, hematocrit 32.3, platelets 481.  His urinalysis was negative for infection.  Tox screen was positive for benzodiazepines and opiates.  His chest x-ray had left rotation with bilateral interstitial infiltrates and bibasilar opacities/small pleural effusions.  His EKG had atrial rhythm, 79 bpm, right axis deviation, right bundle branch block, poor R wave progression.  No significant ST segment or T  wave changes.  He was admitted to the hospital with the working diagnosis of acute on chronic hypoxic/hypercapnic respiratory failure likely due to toxic encephalopathy complicated by acute kidney injury and acute on chronic heart failure.   1.  Acute on chronic hypoxic/hypercapnic respiratory failure complicated by toxic encephalopathy.  Patient was admitted to the intensive care unit, he was placed on invasive mechanical ventilation, patient was extubated March 10 and subsequently transferred to the medical ward.  On further chest imaging he was diagnosed with left lower lobe pneumonia (present on admission) and he received 7 days of IV antibiotic therapy with good toleration.  CT of the chest showed pulmonary edema, several small pulmonary nodules and bilateral pleural effusions.  2.  Acute on chronic diastolic heart failure/ejection fraction 45 to 50%, positive signs of core pulmonale and a positive small pericardial effusion.  Patient underwent aggressive diuresis, negative fluid balance was achieved, (-20,021 ml since admission), with significant improvement of his symptoms.  He underwent cardiac catheterization which showed nonobstructive coronary disease, preserved cardiac output, moderate pulmonary hypertension, pulmonary capillary wedge pressure 10.  PA pressure 52/16, mean 30.  Echocardiogram showed left ventricle ejection fraction 45 to 50%, right ventricle with severely reduced systolic function, flattened septum consistent with cor pulmonale. Patient will continue diuresis with torsemide. Continue afterload reduction with BiDil and beta-blockade with bisoprolol. No ace inh or arb due to risk of worsening renal function.   3.  Acute on chronic anemia.  Likely patient has anemia of chronic disease, his hemoglobin has been trending down during his hospitalization, likely multifactorial, no signs of active bleeding, his hemoglobin today 7.3 with hematocrit 25.6, he will receive 1 packed red blood  cell transfusion today and will recommend follow-up hemoglobin  hematocrit on March 16.  For now continue anticoagulation with rivaroxaban.   4.  Hypertension.  Continue blood pressure control with BiDil.  5.  Paroxysmal atrial fibrillation.  Rate remained well controlled with bisoprolol, continue anticoagulation with rivaroxaban.  6.  Type 2 diabetes mellitus patient was placed on insulin sliding scale for glucose coverage monitoring, capillary glucose remained well controlled. At discharge patient will resume metformin and insulin 70/30 regimen.   7.  Acute kidney injury on chronic kidney stage II with hypokalemia and hypernatremia.  His peak creatinine was 1.5, likely due to overdiuresis, torsemide has been held for the last 24 hours, his discharge creatinine is 1.48, potassium 3.3 and serum bicarbonate 30, Na 147, will recommend follow-up renal panel on March 16.  Resume torsemide in the morning.  8.  Chronic back pain.  Continue pain control with oxycodone.  9.  Morbid obesity and debility.  Calculated BMI 34.9.  Patient was seen by physical therapy, he will be transferred to inpatient rehab.  10.  Stage I pressure ulcer, right buttock, present on admission.  Continue local wound care.  11. COPD. Has remained stable with no signs of acute exacerbation, patient will continue bronchodilator therapy with symbicort and duoneb. Continue oxymetry monitoring and supplemental 02 per [place].   Discharge Diagnoses:  Active Problems:   Essential hypertension   Type 2 diabetes mellitus with neuropathy   Dyspnea   PAF (paroxysmal atrial fibrillation) (HCC)   Nonischemic cardiomyopathy (HCC)   CAD - Non-obstructive by LHC1/16   Acute on chronic combined systolic and diastolic CHF (congestive heart failure) (HCC)   Heart failure (HCC)   Altered mental status   Hypoxia    Discharge Instructions   Allergies as of 01/14/2019   No Known Allergies     Medication List    TAKE these medications    bisoprolol 5 MG tablet Commonly known as:  ZEBETA Take 0.5 tablets (2.5 mg total) by mouth daily for 30 days.   budesonide-formoterol 160-4.5 MCG/ACT inhaler Commonly known as:  SYMBICORT Inhale 2 puffs into the lungs 2 (two) times daily.   diazepam 10 MG tablet Commonly known as:  VALIUM Take 0.5 tablets (5 mg total) by mouth daily as needed for anxiety or sleep. What changed:  how much to take   ferrous sulfate 325 (65 FE) MG tablet Take 325 mg by mouth 3 (three) times daily as needed (for supplementation).   fluticasone 50 MCG/ACT nasal spray Commonly known as:  FLONASE Place 2 sprays into both nostrils daily as needed for allergies.   folic acid 1 MG tablet Commonly known as:  FOLVITE Take 1 mg by mouth daily.   gabapentin 600 MG tablet Commonly known as:  NEURONTIN Take 1 tablet (600 mg total) by mouth 2 (two) times daily.   glucose 4 GM chewable tablet Chew 4 tablets by mouth See admin instructions. Chew 4 tablets by mouth as needed for low blood sugar - repeat every 15 minutes if blood sugar less than 70   guaifenesin 400 MG Tabs tablet Commonly known as:  HUMIBID E Take 1 tablet (400 mg total) by mouth every 6 (six) hours as needed. What changed:  reasons to take this   hydrocortisone 1 % lotion Apply 1 application topically See admin instructions. Apply small amount to back twice daily for itchy rash   hydrocortisone 2.5 % rectal cream Commonly known as:  ANUSOL-HC Place 1 application rectally 2 (two) times daily as needed for hemorrhoids or itching.  insulin aspart protamine - aspart (70-30) 100 UNIT/ML FlexPen Commonly known as:  NovoLOG Mix 70/30 FlexPen Inject 0.05 mLs (5 Units total) into the skin 2 (two) times daily. What changed:  how much to take   ipratropium-albuterol 0.5-2.5 (3) MG/3ML Soln Commonly known as:  DUONEB Take 3 mLs by nebulization 2 (two) times daily.   isosorbide-hydrALAZINE 20-37.5 MG tablet Commonly known as:  BIDIL Take 1  tablet by mouth 3 (three) times daily.   latanoprost 0.005 % ophthalmic solution Commonly known as:  XALATAN Place 1 drop into both eyes at bedtime.   Magnesium Oxide 420 (252 Mg) MG Tabs Take 420 mg by mouth 2 (two) times daily.   metFORMIN 500 MG 24 hr tablet Commonly known as:  GLUCOPHAGE-XR Take 1,000 mg by mouth 2 (two) times daily with a meal.   oxyCODONE-acetaminophen 10-325 MG tablet Commonly known as:  PERCOCET Take 1 tablet by mouth every 6 (six) hours as needed for pain.   OXYGEN Inhale 3 L into the lungs continuous.   potassium chloride SA 20 MEQ tablet Commonly known as:  K-DUR,KLOR-CON Take 2 tablets (40 mEq total) by mouth every morning AND 1 tablet (20 mEq total) at bedtime. What changed:  See the new instructions.   rivaroxaban 20 MG Tabs tablet Commonly known as:  XARELTO Take 1 tablet (20 mg total) by mouth daily with supper. What changed:  when to take this   rosuvastatin 20 MG tablet Commonly known as:  CRESTOR Take 1 tablet (20 mg total) by mouth daily.   torsemide 20 MG tablet Commonly known as:  DEMADEX Take 4 tablets (80 mg total) by mouth 2 (two) times daily for 30 days.      Follow-up Information    [facility]. Go on 01/11/2019.   Specialty:  Cardiology Why:  at 3pm in the [facility].  [facility] for March.  Please bring all medications to appt. Contact information: [address] [phone] [address] [place] [phone]         No Known Allergies  Consultations:  Cardiology   Inpatient Rehab.    Procedures/Studies: Dg Chest 2 View  Result Date: 01/02/2019 CLINICAL DATA:  Hypoxia EXAM: CHEST - 2 VIEW COMPARISON:  12/30/2018 FINDINGS: Cardiac shadow is enlarged but stable. Increased vascular congestion is again identified. Increasing left basilar density consistent with evolving infiltrate/atelectasis is noted. Left effusion is noted as  well. Small right-sided pleural effusion is noted. IMPRESSION: Increasing atelectasis/infiltrate in the left base with associated effusion. Stable vascular congestion is noted. Electronically Signed   By: Inez Catalina M.D.   On: 01/02/2019 12:16   Ct Chest Wo Contrast  Result Date: 01/03/2019 CLINICAL DATA:  73 year old with shortness of breath. History of systolic and diastolic heart failure. EXAM: CT CHEST WITHOUT CONTRAST TECHNIQUE: Multidetector CT imaging of the chest was performed following the standard protocol without IV contrast. COMPARISON:  Abdominal CT 01/02/2019 FINDINGS: Cardiovascular: Atherosclerotic calcifications in the coronary arteries and thoracic aorta. Heart is enlarged. Small amount of pericardial fluid. Mediastinum/Nodes: Large number of small lymph nodes scattered throughout the mediastinum. Index lymph node in the pretracheal region measures 1.3 cm in the short axis on sequence 3, image 59. Probable small hilar lymph nodes but limited evaluation on this noncontrast examination. No significant axillary lymph node enlargement. Lungs/Pleura: Trachea and mainstem bronchi are patent. Centrilobular and paraseptal emphysema. Small right pleural effusion. Volume loss in the right lower lobe. Mild  septal thickening in the lungs. 3 mm nodule in right upper lobe on sequence 5, image 56. 3 mm nodule near the right minor fissure on sequence 5, image 80. Significant volume loss in the left lower lobe. 7 x 6 mm nodule near the left lung apex on sequence 5 image 23. 6 mm nodule at the left lung apex on image 31. 8 x 6 mm nodule in the left upper lobe on sequence 5 image 34. Upper Abdomen: Again noted are scattered areas of soft tissue in the left upper quadrant probably representing accessory spleens following the splenectomy. Again noted is thickening in the left adrenal gland with Hounsfield units measuring roughly 32 and this cannot be considered a benign adenoma based on these imaging findings.  Again noted are small lymph nodes in the upper abdomen. Images of the upper abdomen are limited due to motion. Musculoskeletal: Bridging osteophytes in thoracic spine. No acute bone abnormality. IMPRESSION: 1. Small right pleural effusion with compressive atelectasis in the right lower lobe. 2. Volume loss in left lower lobe. 3. Several small pulmonary nodules. Largest pulmonary nodule measures 8 mm in the left upper lobe. Nodules could be inflammatory but indeterminate. Non-contrast chest CT at 3-6 months is recommended. If the nodules are stable at time of repeat CT, then future CT at 18-24 months (from today's scan) is considered optional for low-risk patients, but is recommended for high-risk patients. This recommendation follows the consensus statement: Guidelines for Management of Incidental Pulmonary Nodules Detected on CT Images: From the Fleischner Society 2017; Radiology 2017; 284:228-243. 4. Aortic Atherosclerosis (ICD10-I70.0) and Emphysema (ICD10-J43.9). Cardiomegaly. 5. Mild septal thickening in the lungs. These findings could be related to mild or chronic pulmonary edema. 6. Several small lymph nodes scattered throughout the chest. Lymph nodes are nonspecific but may be reactive. Recommend attention on follow up imaging. 7. Indeterminate left adrenal nodule/thickening. Electronically Signed   By: Markus Daft M.D.   On: 01/03/2019 14:04   Ct Abdomen Pelvis W Contrast  Result Date: 01/03/2019 CLINICAL DATA:  Acute abdominal pain. EXAM: CT ABDOMEN AND PELVIS WITH CONTRAST TECHNIQUE: Multidetector CT imaging of the abdomen and pelvis was performed using the standard protocol following bolus administration of intravenous contrast. CONTRAST:  163m OMNIPAQUE IOHEXOL 300 MG/ML  SOLN COMPARISON:  Renal ultrasound 12/01/2018, no prior abdominal CT. FINDINGS: Lower chest: Small to moderate right and small left pleural effusion. Associated compressive atelectasis in the lower lobes. Cardiomegaly with small  pericardial effusion. Hepatobiliary: No focal hepatic abnormality. Gallbladder physiologically distended, no calcified stone. No biliary dilatation. Pancreas: No ductal dilatation or inflammation. Spleen: Post splenectomy with multifocal splenic deposits in the left upper quadrant. Soft tissue deposits in the lesser sac may be splenosis or lymph nodes. Adrenals/Urinary Tract: Possible 2 cm left adrenal nodule versus adjacent splenic deposit. The right adrenal gland appears normal. No hydronephrosis. No visualized nephrolithiasis. Absent renal excretion on delayed phase imaging. Urinary bladder is physiologically distended, no bladder wall thickening. Stomach/Bowel: Stomach is nondistended. Normal positioning of the duodenal jejunal junction. No small bowel dilatation, obstruction or inflammation. Mild fecalization of mid small bowel contents. Administered enteric contrast reaches the distal small bowel. Normal appendix. Moderate volume of stool throughout the colon. There is significant sigmoid colonic tortuosity. Vascular/Lymphatic: Aorto bi-iliac atherosclerosis. No aneurysm. Prominent and mildly enlarged retroperitoneal and upper abdominal lymph nodes, anterior caval node measures 14 mm short axis at the level of the gallbladder. Multiple additional prominent retroperitoneal nodes. Difficult to differentiate lymph nodes from splenosis related to splenectomy.  There are small pericardial nodes. Reproductive: Streak artifact from left hip arthroplasty partially obscures the prostate gland. Other: Retroperitoneal stranding greatest in the lower abdomen/pelvis. No frank ascites. No free air. Flank edema, left greater than right. Musculoskeletal: Degenerative change in the spine. Degenerative change in the right hip with avascular necrosis, no subchondral collapse. Left hip arthroplasty. IMPRESSION: 1. No acute findings or explanation for abdominal pain. 2. Multiple prominent and enlarged lymph nodes in the  retroperitoneum and upper abdomen, likely reactive but nonspecific. Additionally patient is post splenectomy with multifocal splenosis and splenic deposits in the upper abdomen, which complicates lymph node evaluation. There are no prior exams available for comparison to evaluate for stability. Recommend clinical and laboratory evaluation for lymphoproliferative disorder. The absence of prior imaging, recommend CT follow-up in 3 months. 3. Absent renal excretion on delayed phase imaging consistent with renal dysfunction. 4. Small to moderate right and small left pleural effusions. Cardiomegaly with small pericardial effusion. 5.  Aortic Atherosclerosis (ICD10-I70.0). Electronically Signed   By: Keith Rake M.D.   On: 01/03/2019 00:35   US Abdomen Limited  Result Date: 01/03/2019 CLINICAL DATA:  Splenectomy EXAM: LIMITED ABDOMEN ULTRASOUND FOR ASCITES TECHNIQUE: Limited ultrasound survey for ascites was performed in all four abdominal quadrants. COMPARISON:  CT 01/02/2019 FINDINGS: No significant ascites visualized within the abdomen or pelvis. IMPRESSION: Negative for significant ascites by ultrasound. Electronically Signed   By: Donavan Foil M.D.   On: 01/03/2019 20:15   Dg Chest Port 1 View  Result Date: 01/11/2019 CLINICAL DATA:  Follow-up endotracheal tube EXAM: PORTABLE CHEST 1 VIEW COMPARISON:  01/10/2019 FINDINGS: Endotracheal tube, gastric catheter and right jugular central line are again seen and stable. Cardiac shadow remains enlarged. Persistent left basilar opacity and effusion is seen. No new focal infiltrate on the right is noted. IMPRESSION: Stable appearance of left basilar infiltrate with effusion. Tubes and lines as described. Electronically Signed   By: Inez Catalina M.D.   On: 01/11/2019 05:47   Dg Chest Port 1 View  Result Date: 01/10/2019 CLINICAL DATA:  Respiratory failure EXAM: PORTABLE CHEST 1 VIEW COMPARISON:  Yesterday FINDINGS: Endotracheal tube tip at the clavicular heads.  The orogastric tube at least reaches the stomach. Right IJ line with tip at the SVC. Lung opacity at the bases with small left pleural effusion. No pneumothorax. Cardiomegaly. IMPRESSION: 1. Unremarkable hardware positioning. 2. Lower lobe atelectasis and pleural fluid based on recent chest CT. No change from yesterday. Electronically Signed   By: Monte Fantasia M.D.   On: 01/10/2019 06:55   Dg Chest Port 1 View  Result Date: 01/09/2019 CLINICAL DATA:  Respiratory failure EXAM: PORTABLE CHEST 1 VIEW COMPARISON:  01/08/2019 FINDINGS: Cardiac shadow is mildly enlarged but stable. Endotracheal tube, gastric catheter and right jugular central line are again seen and stable. Persistent left basilar atelectasis with associated effusion is noted. No new focal infiltrate is seen. IMPRESSION: Stable appearance of the chest from the previous day. Electronically Signed   By: Inez Catalina M.D.   On: 01/09/2019 07:20   Dg Chest Port 1 View  Result Date: 01/08/2019 CLINICAL DATA:  Respiratory failure EXAM: PORTABLE CHEST 1 VIEW COMPARISON:  01/07/2019 FINDINGS: Cardiac shadow is stable. Endotracheal tube, gastric catheter and right jugular central line are again seen and stable. The lungs are well aerated bilaterally. Persistent left basilar opacity with associated effusion is seen. Mild right basilar changes have improved somewhat in the interval from the prior exam. IMPRESSION: Persistent left basilar opacity with associated  effusion. Electronically Signed   By: Inez Catalina M.D.   On: 01/08/2019 08:07   Dg Chest Port 1 View  Result Date: 01/07/2019 CLINICAL DATA:  Respiratory failure EXAM: PORTABLE CHEST 1 VIEW COMPARISON:  01/06/2019 FINDINGS: Support devices are stable. Cardiomegaly with vascular congestion, bilateral perihilar and lower lobe airspace opacities. Possible left effusion. No acute bony abnormality. IMPRESSION: No significant change since prior study. Electronically Signed   By: Rolm Baptise M.D.   On:  01/07/2019 07:17   Dg Chest Port 1 View  Result Date: 01/06/2019 CLINICAL DATA:  Respiratory failure EXAM: PORTABLE CHEST 1 VIEW COMPARISON:  Yesterday FINDINGS: The internal and external portions of the endotracheal tube overlap. Based on tubing, the internal tip is at the level of the clavicular heads, stable. The orogastric tube at least reaches the stomach. Unremarkable right IJ line. Haziness of the bilateral lower chest, stable and characterized by CT 3 days ago. Cardiomegaly. IMPRESSION: 1. Stable hardware positioning. 2. Unchanged lung opacity and layering pleural fluid. Electronically Signed   By: Monte Fantasia M.D.   On: 01/06/2019 08:01   Dg Chest Port 1 View  Result Date: 01/05/2019 CLINICAL DATA:  History of endotracheal tube EXAM: PORTABLE CHEST 1 VIEW COMPARISON:  Yesterday FINDINGS: Endotracheal tube tip at the clavicular heads. The orogastric tube at least reaches the stomach. Right IJ line with tip at the upper cavoatrial junction region. Cardiomegaly and diffuse lung opacity asymmetric to the left. Left diaphragm may be elevated. No pneumothorax. IMPRESSION: 1. Stable hardware positioning. 2. Unchanged extensive opacity with atelectasis and vascular congestion seen on chest CT 2 days ago Electronically Signed   By: Monte Fantasia M.D.   On: 01/05/2019 07:48   Portable Chest X-ray  Result Date: 01/04/2019 CLINICAL DATA:  73 y/o  M; encounter for central line placement. EXAM: PORTABLE CHEST 1 VIEW COMPARISON:  01/03/2019 CT chest. 01/02/2019 chest radiograph. FINDINGS: Small right and moderate left pleural effusions. Left basilar opacity. Diffuse hazy opacities of the lungs. Findings similar to prior chest radiograph given differences in technique. Endotracheal tube tip projects 3.8 cm above the carina. Right central venous catheter tip projects over mid SVC. Enteric tube extends below the field of view into the abdomen. No pneumothorax. Aortic arch calcific atherosclerosis. No acute  osseous abnormality is evident. Cardiomegaly, partially obscured by left basilar opacity. IMPRESSION: 1. Right central venous catheter tip projects over mid SVC. Enteric tube extends below the field of view into the abdomen. Enteric tube tip projects 3.9 cm above carina. 2. Small right and moderate left pleural effusions, left basilar consolidation, and diffuse hazy opacities of the lungs are stable from prior chest radiograph. Electronically Signed   By: Kristine Garbe M.D.   On: 01/04/2019 17:01   Dg Chest Port 1 View  Result Date: 12/30/2018 CLINICAL DATA:  Dyspnea EXAM: PORTABLE CHEST 1 VIEW COMPARISON:  12/07/2018 FINDINGS: Diffuse bilateral airspace disease compatible with edema is unchanged. Small bilateral effusions and bibasilar atelectasis unchanged. Right-sided vascular catheter has been removed. IMPRESSION: No interval change in congestive heart failure with edema and bilateral effusions. Electronically Signed   By: Franchot Gallo M.D.   On: 12/30/2018 17:02       Subjective: Patient is feeling better, this am has not been very communicative but does not seem to be in significant pain or dyspnea. Has been tolerating po well.   Discharge Exam: Vitals:   01/13/19 2112 01/14/19 0551  BP: 99/64 (!) 96/37  Pulse:  76  Resp:  18  Temp:  (!) 97.5 F (36.4 C)  SpO2:  94%   Vitals:   01/13/19 2010 01/13/19 2112 01/14/19 0244 01/14/19 0551  BP: (!) 93/51 99/64  (!) 96/37  Pulse: 71   76  Resp: 18   18  Temp: 98.3 F (36.8 C)   (!) 97.5 F (36.4 C)  TempSrc: Oral     SpO2: 91%   94%  Weight:   116.6 kg   Height:        General: Not in pain or dyspnea  Neurology: Awake and alert, non focal  E ENT: mild pallor, no icterus, oral mucosa moist Cardiovascular: No JVD. S1-S2 present, rhythmic, no gallops, rubs, or murmurs. Trace lower extremity edema. Pulmonary: positive breath sounds bilaterally, no significant wheezing, rhonchi or rales. Gastrointestinal. Abdomen  protuberant, no organomegaly, non tender, no rebound or guarding Skin. No rashes Musculoskeletal: no joint deformities   The results of significant diagnostics from this hospitalization (including imaging, microbiology, ancillary and laboratory) are listed below for reference.     Microbiology: Recent Results (from the past 240 hour(s))  Culture, respiratory (non-expectorated)     Status: None   Collection Time: 01/04/19  4:56 PM  Result Value Ref Range Status   Specimen Description TRACHEAL ASPIRATE  Final   Special Requests NONE  Final   Gram Stain   Final    ABUNDANT WBC PRESENT,BOTH PMN AND MONONUCLEAR NO ORGANISMS SEEN    Culture   Final    Consistent with normal respiratory flora. Performed at [facility], [place] [address], [place], [place] [address]    Report Status 01/06/2019 FINAL  Final     Labs: BNP (last 3 results) Recent Labs    11/21/18 2238 12/01/18 0015 12/30/18 1621  BNP 1,739.7* 1,666.7* 2,876.8*   Basic Metabolic Panel: Recent Labs  Lab 01/07/19 1640  01/09/19 2240 01/10/19 0403 01/11/19 0409 01/11/19 0440 01/12/19 0425 01/13/19 0554 01/14/19 0506  NA  --    < > 158* 156* 159* 160* 154* 149* 147*  K  --    < > 3.3* 3.4* 3.9 3.8 3.0* 3.3* 3.3*  CL  --    < > 114* 113* 115*  --  113* 107 105  CO2  --    < > 37* 35* 34*  --  36* 32 30  GLUCOSE  --    < > 154* 248* 200*  --  142* 219* 268*  BUN  --    < > 69* 70* 85*  --  65* 55* 49*  CREATININE  --    < > 1.22 1.24 1.51*  --  1.18 1.45* 1.48*  CALCIUM  --    < > 9.2 9.2 9.1  --  8.7* 8.5* 8.3*  MG 2.3  --  2.5* 2.6* 2.5*  --  2.4  --   --   PHOS 3.4  --   --   --  3.2  --  3.6  --   --    < > = values in this interval not displayed.   Liver Function Tests: No results for input(s): AST, ALT, ALKPHOS, BILITOT, PROT, ALBUMIN in the last 168 hours. No results for input(s): LIPASE, AMYLASE in the last 168 hours. No results for input(s): AMMONIA in the last 168 hours. CBC: Recent Labs   Lab 01/09/19 0434 01/10/19 0403 01/11/19 0409 01/11/19 0440 01/12/19 0425 01/13/19 0554 01/14/19 1201  WBC 13.6* 15.2* 15.6*  --  16.1* 13.5*  --  HGB 9.1* 9.0* 8.4* 9.9* 7.6* 7.5* 7.3*  HCT 32.1* 31.7* 30.1* 29.0* 26.8* 26.3* 25.6*  MCV 89.7 90.8 91.5  --  91.8 88.9  --   PLT 377 359 346  --  322 292  --    Cardiac Enzymes: No results for input(s): CKTOTAL, CKMB, CKMBINDEX, TROPONINI in the last 168 hours. BNP: Invalid input(s): POCBNP CBG: Recent Labs  Lab 01/13/19 1136 01/13/19 1631 01/13/19 2101 01/14/19 0600 01/14/19 1145  GLUCAP 239* 246* 271* 259* 332*   D-Dimer No results for input(s): DDIMER in the last 72 hours. Hgb A1c No results for input(s): HGBA1C in the last 72 hours. Lipid Profile No results for input(s): CHOL, HDL, LDLCALC, TRIG, CHOLHDL, LDLDIRECT in the last 72 hours. Thyroid function studies No results for input(s): TSH, T4TOTAL, T3FREE, THYROIDAB in the last 72 hours.  Invalid input(s): FREET3 Anemia work up No results for input(s): VITAMINB12, FOLATE, FERRITIN, TIBC, IRON, RETICCTPCT in the last 72 hours. Urinalysis    Component Value Date/Time   COLORURINE YELLOW 12/30/2018 1759   APPEARANCEUR CLEAR 12/30/2018 1759   LABSPEC 1.015 12/30/2018 1759   PHURINE 5.0 12/30/2018 1759   GLUCOSEU NEGATIVE 12/30/2018 1759   HGBUR NEGATIVE 12/30/2018 1759   BILIRUBINUR NEGATIVE 12/30/2018 1759   KETONESUR NEGATIVE 12/30/2018 1759   PROTEINUR 30 (A) 12/30/2018 1759   UROBILINOGEN 0.2 10/26/2014 2206   NITRITE NEGATIVE 12/30/2018 1759   LEUKOCYTESUR NEGATIVE 12/30/2018 1759   Sepsis Labs Invalid input(s): PROCALCITONIN,  WBC,  LACTICIDVEN Microbiology Recent Results (from the past 240 hour(s))  Culture, respiratory (non-expectorated)     Status: None   Collection Time: 01/04/19  4:56 PM  Result Value Ref Range Status   Specimen Description TRACHEAL ASPIRATE  Final   Special Requests NONE  Final   Gram Stain   Final    ABUNDANT WBC  PRESENT,BOTH PMN AND MONONUCLEAR NO ORGANISMS SEEN    Culture   Final    Consistent with normal respiratory flora. Performed at [facility], [place] [address], [place], [place] [address]    Report Status 01/06/2019 FINAL  Final     Time coordinating discharge: 45 minutes  SIGNED:   Tawni Millers, MD  [facility] 01/14/2019, 1:17 PM

## 2019-01-14 NOTE — Progress Notes (Addendum)
Inpatient Rehabilitation Admissions Coordinator  Inpatient Rehab Consult received. I met with patient and his wife at the bedside for rehabilitation assessment. We discussed goals and expectations of an inpatient rehab admission.  Patient and wife prefer an inpt rehab admission rather than SNF. Wife states patient cognitively is at baseline. Patient asked numerous appropriate questions about inpt rehab and I answered all questions. I will discuss with Dr. Naaman Plummer to clarify if I can admit pt today. RN CM, Hassan Rowan, made aware of plan.  Danne Baxter, RN, MSN Rehab Admissions Coordinator [phone] 01/14/2019 10:49 AM   Patient approved for an inpt rehab admit. I contacted Dr. Cathlean Sauer to arrange. I will make the arrangements to admit today.  Danne Baxter, RN, MSN Rehab Admissions Coordinator [phone] 01/14/2019 11:59 AM

## 2019-01-14 NOTE — Progress Notes (Signed)
Inpatient Rehabilitation Admissions Coordinator  Discussed need for transfusion today with both Dr. Cathlean Sauer and Dr. Naaman Plummer. Both in agreement to admit to CIR today and receive transfusion.  Danne Baxter, RN, MSN Rehab Admissions Coordinator [phone] 01/14/2019 4:06 PM

## 2019-01-14 NOTE — Progress Notes (Signed)
Nicholas Staggers, MD  Physician  Physical Medicine and Rehabilitation  PMR Pre-admission  Signed  Date of Service:  01/14/2019 11:00 AM       Related encounter: ED to Hosp-Admission (Current) from 12/30/2018 in [facility]      Signed         Show:Clear all _0 Manual_1 Template_2 Copied  Added by: _3 Nicholas Gong, RN_4 Nicholas Staggers, MD  _5 Hover for details  PMR Admission Coordinator Pre-Admission Assessment  Patient: Nicholas Caldwell is an 73 y.o., male MRN: [id number] DOB: [birth year] Height: 6' (182.9 cm) Weight: 116.6 kg  Insurance Information HMO:     PPO:      PCP:      IPA:      80/20:      OTHER: no hmo PRIMARY: Medicare a and b      Policy#: [id number]      Subscriber: pt Benefits:  Phone #: passport one online     Name: 01/14/2019 Eff. Date: a 06/04/2011 b 04/03/2013     Deduct: $1408      Out of Pocket Max: none      Life Max: none CIR: 100%      SNF: 20 full days Outpatient: 80%     Co-Pay: 20% Home Health: 100%      Co-Pay: none DME: 80%     Co-Pay: 20% Providers: pt choice  SECONDARY: none      Medicaid Application Date:        Case Manager:   Disability Application Date:        Case Worker:   Emergency Contact Information         Contact Information    Name Relation Home Work Mobile   Nicholas Caldwell Spouse [phone]     Nicholas Caldwell Daughter   [phone]   Nicholas Caldwell Relative   [phone]      Current Medical History  Patient Admitting Diagnosis:  Debility  History of Present Illness:Nicholas Caldwell is a 73 year old right-handed male history of atrial flutter/PAF and continues on Xarelto, CAD,chronic back pain maintained on Neurontin as well as oxycodone as needed,chronic anemia, chronic diastolic congestive heart failure, diabetes mellitus maintained on Glucophage 1000 mg twice a day and NovoLog 70/30, hypertension maintained on Bidil, COPD/tobacco abuse and used oxygen at home.   Presented 12/30/2018 with increasing shortness of breath and approximately 12 pounds of weight gain over the last 4 days. Increase edema lower extremities. Chest x-ray showed no interval change and congestive heart failure with edema and bilateral effusions. CT the chest showed small right pleural effusion as well as several small pulmonary nodules largest pulmonary nodule measuring 8 mm in the left upper lobe noncontrast CT of the chest recommended 3-6 months. Ultrasound the abdomen negative for significant ascites. Urine drug screen was positive for opiates and benzos. Acute on chronic anemia hemoglobin 8.7. EKG atrial fibrillationwith history of PAF maintained on Xarelto. Follow-up cardiology services. Patient did require DCCV 01/10/2019 and remained on intravenous heparin for a short time. Noted with ongoing acute respiratory failure related to CHF requiring intubation 01/04/2019 through 01/11/2019. Diet has been advanced to regular consistency.  Patient's medical record from [facility] has been reviewed by the rehabilitation admission coordinator and physician.  Past Medical History      Past Medical History:  Diagnosis Date  . Atrial flutter ([place])    a. recurrent AFlutter with RVR;  b. Amiodarone Rx started 4/16  . CAD (coronary artery disease)    a. LHC 1/16:  mLAD diffuse disease, pLCx mild disease, dLCx with disease but too small for PCI, RCA ok, EF 25-30%  . Chronic pain   . Chronic systolic CHF (congestive heart failure) ([place])   . COPD (chronic obstructive pulmonary disease) ([place])   . Diabetes mellitus without complication ([place])   . Hypercholesteremia   . Hypertension   . NICM (nonischemic cardiomyopathy) ([place])    a.dx 2016. b. 2D echo 06/2016 - Last echo 07/01/16: mod dilated LV, mod LVH, EF 25-30%, mild-mod MR, sev LAE, mild-mod reduced RV systolic function, mild-mod TR, PASP 17mHG.  .Marland KitchenPAF (paroxysmal atrial fibrillation) (H[facility])    On amio - ot a candidate  for flecainide due to cardiomyopathy, not a candidate for Tikosyn due to prolonged QT, and felt to be a poor candidate for ablation given left atrial size.  . Pulmonary hypertension ([place]   . Tobacco abuse     Family History   family history includes Heart disease in his father and mother; Heart failure in his mother; Hypertension in his mother and sister.  Prior Rehab/Hospitalizations Has the patient had major surgery during 100 days prior to admission? No               Current Medications  Current Facility-Administered Medications:  .  0.9 %  sodium chloride infusion, 250 mL, Intravenous, Continuous, BErick Colace NP, Last Rate: 10 mL/hr at 01/13/19 0500 .  0.9 %  sodium chloride infusion, , Intravenous, Continuous, Clegg, Amy D, NP, Stopped at 01/10/19 1414 .  0.9 %  sodium chloride infusion, 250 mL, Intravenous, Continuous, TShirley Friar PA-C .  acetaminophen (TYLENOL) solution 650 mg, 650 mg, Per Tube, Q6H PRN, SChesley Mires MD, 650 mg at 01/09/19 1537 .  bisacodyl (DULCOLAX) suppository 10 mg, 10 mg, Rectal, Daily PRN, Deterding, EGuadelupe Sabin MD .  bisoprolol (ZEBETA) tablet 2.5 mg, 2.5 mg, Oral, Daily, MLarey Dresser MD, 2.5 mg at 01/14/19 0845 .  docusate sodium (COLACE) capsule 50 mg, 50 mg, Oral, BID, YRush Farmer MD, 50 mg at 01/13/19 2103 .  folic acid (FOLVITE) tablet 1 mg, 1 mg, Oral, Daily, YRush Farmer MD, 1 mg at 01/13/19 0820 .  gabapentin (NEURONTIN) tablet 600 mg, 600 mg, Oral, BID, YRush Farmer MD, 600 mg at 01/14/19 0845 .  insulin aspart (novoLOG) injection 0-15 Units, 0-15 Units, Subcutaneous, TID WC, YRush Farmer MD, 8 Units at 01/14/19 0606 .  insulin aspart (novoLOG) injection 0-5 Units, 0-5 Units, Subcutaneous, QHS, YRush Farmer MD, 3 Units at 01/13/19 2102 .  insulin detemir (LEVEMIR) injection 15 Units, 15 Units, Subcutaneous, Daily, SChesley Mires MD, 15 Units at 01/13/19 0835 .  ipratropium-albuterol (DUONEB)  0.5-2.5 (3) MG/3ML nebulizer solution 3 mL, 3 mL, Nebulization, Q4H PRN, SChesley Mires MD, 3 mL at 01/08/19 0334 .  isosorbide-hydrALAZINE (BIDIL) 20-37.5 MG per tablet 0.5 tablet, 0.5 tablet, Oral, TID, MLarey Dresser MD, 0.5 tablet at 01/14/19 0847 .  labetalol (NORMODYNE,TRANDATE) injection 10 mg, 10 mg, Intravenous, Q4H PRN, Sood, Vineet, MD .  latanoprost (XALATAN) 0.005 % ophthalmic solution 1 drop, 1 drop, Both Eyes, QHS, BSalvadore DomE, NP, 1 drop at 01/13/19 2111 .  MEDLINE mouth rinse, 15 mL, Mouth Rinse, BID, YRush Farmer MD, 15 mL at 01/12/19 2007 .  multivitamin with minerals tablet 1 tablet, 1 tablet, Oral, Daily, YRush Farmer MD, 1 tablet at 01/14/19 0844 .  oxyCODONE-acetaminophen (PERCOCET/ROXICET) 5-325 MG per tablet 2 tablet, 2 tablet, Oral, Q6H PRN, Arrien,  Jimmy Picket, MD, 2 tablet at 01/14/19 0845 .  pantoprazole (PROTONIX) EC tablet 40 mg, 40 mg, Oral, BID, Lore, Melissa A, RPH, 40 mg at 01/14/19 0845 .  potassium chloride SA (K-DUR,KLOR-CON) CR tablet 60 mEq, 60 mEq, Oral, Once, Georgiana Shore, NP .  predniSONE (DELTASONE) tablet 10 mg, 10 mg, Oral, Q breakfast, Lore, Melissa A, RPH, 10 mg at 01/13/19 0832 .  rivaroxaban (XARELTO) tablet 20 mg, 20 mg, Oral, Q supper, Shirley Friar, PA-C, 20 mg at 01/13/19 1714 .  rosuvastatin (CRESTOR) tablet 20 mg, 20 mg, Oral, q1800, Rush Farmer, MD, 20 mg at 01/13/19 1714 .  sodium chloride flush (NS) 0.9 % injection 3 mL, 3 mL, Intravenous, Q12H, Shirley Friar, PA-C, 3 mL at 01/11/19 0912 .  sodium chloride flush (NS) 0.9 % injection 3 mL, 3 mL, Intravenous, PRN, Shirley Friar, PA-C  Patients Current Diet:      Diet Order                  Diet heart healthy/carb modified Room service appropriate? Yes; Fluid consistency: Thin  Diet effective now               Precautions / Restrictions Precautions Precautions: Fall Precaution Comments: 3-4L O2 [place] baseline  Restrictions Weight Bearing Restrictions: No   Has the patient had 2 or more falls or a fall with injury in the past year?Yes  Prior Activity Level Limited Community (1-2x/wk): sedentary, Mod I with RW; does not drive  Prior Functional Level Do you want Prior Function Level of Independence: Independent with assistive device(s) Gait / Transfers Assistance Needed: uses RW for mobility ADL's / Homemaking Assistance Needed: assist from wife for LB ADL and wife does IADL Comments: denies falls between admssions. was working with [place] from other? Self Care: Did the patient need help bathing, dressing, using the toilet or eating?  Needed some help  Indoor Mobility: Did the patient need assistance with walking from room to room (with or without device)? Independent  Stairs: Did the patient need assistance with internal or external stairs (with or without device)? Needed some help  Functional Cognition: Did the patient need help planning regular tasks such as shopping or remembering to take medications? Needed some help  Home Assistive Devices / [place] Devices/Equipment: Environmental consultant (specify type), Eyeglasses, Cane (specify quad or straight) Home Equipment: Walker - 2 wheels, Cane - single point, Bedside commode, Shower seat  Prior Device Use: Indicate devices/aids used by the patient prior to current illness, exacerbation or injury? Walker  Prior Functional Level Comments: denies falls between [employer]. was working with [place]   Prior Functional Level Current Functional Level  Bed Mobility  Needs Assistance  Transfers   Needs assistance  Mobility - Walk/Wheelchair    Mobility - Ambulation/Gait  Min guard  Upper Body Dressing  Minimal assistance, Sitting  Lower Body Dressing  Maximal assistance, Sit to/from stand  Grooming  Set up, Sitting  Eating/Drinking  Set up, Sitting  Toilet Transfer  Moderate assistance, +2 for physical assistance, +2 for  safety/equipment, Stand-pivot, [facility]  Bladder Continence    Bowel Management     Stair Climbing    Communication  No difficulties  Memory    Cooking/Meal Prep        Housework     Money Management     Driving       Special needs/care consideration BiPAP/CPAP usign CPAP this admit which is new; DId not have at  hopme pta CPM n/a Continuous Drip IV n/a Dialysis n/a Life Vest n/a Oxygen 3 to 4 liters nasal cannula at baseline 24/7 at home pta Special Bed n/a Trach Size n/a Wound Vac n/a Skin MASD groin and perineum; skin tear to penis, stage 1 healing to buttocks Bowel mgmt: continent LBM 3/12 Bladder mgmt: external catheter; incontinent Diabetic mgmt yes  Previous Home Environment Living Arrangements: Spouse/significant other  Lives With: Spouse Available Help at Discharge: Family, Available 24 hours/day Type of Home: House Home Layout: One level Home Access: Stairs to enter Entrance Stairs-Rails: None Entrance Stairs-Number of Steps: 1 Bathroom Shower/Tub: Optometrist: Yes Home Care Services: No Additional Comments: on 3L of O2 at all times at home  Discharge Living Setting Plans for Discharge Living Setting: Patient's home, Lives with (comment)(wife) Type of Home at Discharge: House Discharge Home Layout: One level Discharge Home Access: Stairs to enter Entrance Stairs-Rails: None Entrance Stairs-Number of Steps: 1 Discharge Bathroom Shower/Tub: Tub/shower unit Discharge Bathroom Toilet: Standard Discharge Bathroom Accessibility: Yes How Accessible: Accessible via walker Does the patient have any problems obtaining your medications?: No  Social/Family/Support Systems Patient Roles: Spouse, Parent Contact Information: wife, patricia Anticipated Caregiver: wife Anticipated Caregiver's Contact Information: see above Ability/Limitations of Caregiver: no limitations Caregiver  Availability: 24/7 Discharge Plan Discussed with Primary Caregiver: Yes Is Caregiver In Agreement with Plan?: Yes Does Caregiver/Family have Issues with Lodging/Transportation while Pt is in Rehab?: No(wife has been staying the night, coming in the evenings and )  Goals/Additional Needs Patient/Family Goal for Rehab: supervision to min PT and OT Expected length of stay: ELOS 14 to 20 days Additional Information: Patient using CPAP new this admission; did not have one pta Pt/Family Agrees to Admission and willing to participate: Yes Program Orientation Provided & Reviewed with Pt/Caregiver Including Roles  & Responsibilities: Yes   Used [facility] pta  Patient Condition: I have reviewed medical records from [facility] spoken with patient and  spouse. I met with patient and spouse at the bedside inpatient rehabilitation assessment.  Patient will benefit from ongoing PT and  OT, can actively participate in 3 hours of therapy a day 5 days of the week, and can make measurable gains during the admission.  Patient will also benefit from the coordinated team approach during an Inpatient Acute Rehabilitation admission.  The patient will receive intensive therapy as well as Rehabilitation physician, nursing, social worker, and care management interventions.  Due to bowel management, bladder management, safety, skin/wound care, disease management, medical administration, pain management, patient education the patient requires 24 hour a day rehabilitation nursing.  The patient is currently mod to max with mobility and basic ADLs.  Discharge setting and therapy post discharge at  home with home health is anticipated.  Patient has agreed to participate in the Acute Inpatient Rehabilitation Program and will admit today.  Preadmission Screen Completed By:  Cleatrice Burke RN MSN, 01/14/2019 12:03 PM ______________________________________________________________________   Discussed status with  Dr. Naaman Plummer  on  01/14/2019  at  1202 and received telephone approval for admission today.  Admission Coordinator:  Cleatrice Burke, time  [id number] Date  01/14/2019   Assessment/Plan: Diagnosis: debility 1. Does the need for close, 24 hr/day  Medical supervision in concert with the patient's rehab needs make it unreasonable for this patient to be served in a less intensive setting? Yes 2. Co-Morbidities requiring supervision/potential complications: morbid obesity, PAF, CAD, CHF, COPD 3. Due to bladder management, bowel management,  safety, skin/wound care, disease management, medication administration, pain management and patient education, does the patient require 24 hr/day rehab nursing? Yes 4. Does the patient require coordinated care of a physician, rehab nurse, PT (1-2 hrs/day, 5 days/week) and OT (1-2 hrs/day, 5 days/week) to address physical and functional deficits in the context of the above medical diagnosis(es)? Yes Addressing deficits in the following areas: balance, endurance, locomotion, strength, transferring, bowel/bladder control, bathing, dressing, feeding, grooming, toileting and psychosocial support 5. Can the patient actively participate in an intensive therapy program of at least 3 hrs of therapy 5 days a week? Yes 6. The potential for patient to make measurable gains while on inpatient rehab is excellent 7. Anticipated functional outcomes upon discharge from inpatients are: supervision and min assist PT, supervision and min assist OT, n/a SLP 8. Estimated rehab length of stay to reach the above functional goals is: 14-20 days 9. Anticipated D/C setting: Home 10. Anticipated post D/C treatments: [place] therapy 11. Overall Rehab/Functional Prognosis: excellent  Nicholas Staggers, MD, [place] [facility] 01/14/2019   Cleatrice Burke RN MSN Admissions Coordinaotr 01/14/2019        Revision History

## 2019-01-14 NOTE — PMR Pre-admission (Signed)
PMR Admission Coordinator Pre-Admission Assessment  Patient: Nicholas Caldwell is an 73 y.o., male MRN: [id number] DOB: 1946/06/06 Height: 6' (182.9 cm) Weight: 116.6 kg  Insurance Information HMO:     PPO:      PCP:      IPA:      80/20:      OTHER: no hmo PRIMARY: Medicare a and b      Policy#: [id number]      Subscriber: pt Benefits:  Phone #: passport one online     Name: 01/14/2019 Eff. Date: a 06/04/2011 b 04/03/2013     Deduct: $1408      Out of Pocket Max: none      Life Max: none CIR: 100%      SNF: 20 full days Outpatient: 80%     Co-Pay: 20% Home Health: 100%      Co-Pay: none DME: 80%     Co-Pay: 20% Providers: pt choice  SECONDARY: none      Medicaid Application Date:        Case Manager:   Disability Application Date:        Case Worker:   Emergency Contact Information Contact Information    Name Relation Home Work Mobile   [place] Spouse [phone]     Sima Matas Daughter   [phone]   Manuel,Dorothy Relative   [phone]      Current Medical History  Patient Admitting Diagnosis:  Debility  History of Present Illness: Nicholas Caldwell is a 73 year old right-handed male history of atrial flutter/PAF and continues on Xarelto, CAD,chronic back pain maintained on Neurontin as well as oxycodone as needed,chronic anemia, chronic diastolic congestive heart failure, diabetes mellitus maintained on Glucophage 1000 mg twice a day and NovoLog 70/30, hypertension maintained on Bidil, COPD/tobacco abuse and used oxygen at home.  Presented 12/30/2018 with increasing shortness of breath and approximately 12 pounds of weight gain over the last 4 days. Increase edema lower extremities. Chest x-ray showed no interval change and congestive heart failure with edema and bilateral effusions. CT the chest showed small right pleural effusion as well as several small pulmonary nodules largest pulmonary nodule measuring 8 mm in the left upper lobe noncontrast CT of the chest recommended  3-6 months. Ultrasound the abdomen negative for significant ascites. Urine drug screen was positive for opiates and benzos. Acute on chronic anemia hemoglobin 8.7. EKG atrial fibrillationwith history of PAF maintained on Xarelto. Follow-up cardiology services. Patient did require DCCV 01/10/2019 and remained on intravenous heparin for a short time. Noted with ongoing acute respiratory failure related to CHF requiring intubation 01/04/2019 through 01/11/2019.  Diet has been advanced to regular consistency.  Patient's medical record from [facility] has been reviewed by the rehabilitation admission coordinator and physician.  Past Medical History  Past Medical History:  Diagnosis Date  . Atrial flutter ([place])    a. recurrent AFlutter with RVR;  b. Amiodarone Rx started 4/16  . CAD (coronary artery disease)    a. LHC 1/16:  mLAD diffuse disease, pLCx mild disease, dLCx with disease but too small for PCI, RCA ok, EF 25-30%  . Chronic pain   . Chronic systolic CHF (congestive heart failure) ([place])   . COPD (chronic obstructive pulmonary disease) ([place])   . Diabetes mellitus without complication ([place])   . Hypercholesteremia   . Hypertension   . NICM (nonischemic cardiomyopathy) ([place])    a.dx 2016. b. 2D echo 06/2016 - Last echo 07/01/16: mod dilated LV, mod LVH, EF 25-30%, mild-mod MR, sev  LAE, mild-mod reduced RV systolic function, mild-mod TR, PASP 92mHG.  .Marland KitchenPAF (paroxysmal atrial fibrillation) ([facility])    On amio - ot a candidate for flecainide due to cardiomyopathy, not a candidate for Tikosyn due to prolonged QT, and felt to be a poor candidate for ablation given left atrial size.  . Pulmonary hypertension ([place]   . Tobacco abuse     Family History   family history includes Heart disease in his father and mother; Heart failure in his mother; Hypertension in his mother and sister.  Prior Rehab/Hospitalizations Has the patient had major surgery during 100 days prior to admission?  No    Current Medications  Current Facility-Administered Medications:  .  0.9 %  sodium chloride infusion, 250 mL, Intravenous, Continuous, BErick Colace NP, Last Rate: 10 mL/hr at 01/13/19 0500 .  0.9 %  sodium chloride infusion, , Intravenous, Continuous, Clegg, Amy D, NP, Stopped at 01/10/19 1414 .  0.9 %  sodium chloride infusion, 250 mL, Intravenous, Continuous, TShirley Friar PA-C .  acetaminophen (TYLENOL) solution 650 mg, 650 mg, Per Tube, Q6H PRN, SChesley Mires MD, 650 mg at 01/09/19 1537 .  bisacodyl (DULCOLAX) suppository 10 mg, 10 mg, Rectal, Daily PRN, Deterding, EGuadelupe Sabin MD .  bisoprolol (ZEBETA) tablet 2.5 mg, 2.5 mg, Oral, Daily, MLarey Dresser MD, 2.5 mg at 01/14/19 0845 .  docusate sodium (COLACE) capsule 50 mg, 50 mg, Oral, BID, YRush Farmer MD, 50 mg at 01/13/19 2103 .  folic acid (FOLVITE) tablet 1 mg, 1 mg, Oral, Daily, YRush Farmer MD, 1 mg at 01/13/19 0820 .  gabapentin (NEURONTIN) tablet 600 mg, 600 mg, Oral, BID, YRush Farmer MD, 600 mg at 01/14/19 0845 .  insulin aspart (novoLOG) injection 0-15 Units, 0-15 Units, Subcutaneous, TID WC, YRush Farmer MD, 8 Units at 01/14/19 0606 .  insulin aspart (novoLOG) injection 0-5 Units, 0-5 Units, Subcutaneous, QHS, YRush Farmer MD, 3 Units at 01/13/19 2102 .  insulin detemir (LEVEMIR) injection 15 Units, 15 Units, Subcutaneous, Daily, SChesley Mires MD, 15 Units at 01/13/19 0835 .  ipratropium-albuterol (DUONEB) 0.5-2.5 (3) MG/3ML nebulizer solution 3 mL, 3 mL, Nebulization, Q4H PRN, SChesley Mires MD, 3 mL at 01/08/19 0334 .  isosorbide-hydrALAZINE (BIDIL) 20-37.5 MG per tablet 0.5 tablet, 0.5 tablet, Oral, TID, MLarey Dresser MD, 0.5 tablet at 01/14/19 0847 .  labetalol (NORMODYNE,TRANDATE) injection 10 mg, 10 mg, Intravenous, Q4H PRN, Sood, Vineet, MD .  latanoprost (XALATAN) 0.005 % ophthalmic solution 1 drop, 1 drop, Both Eyes, QHS, BSalvadore DomE, NP, 1 drop at 01/13/19 2111 .   MEDLINE mouth rinse, 15 mL, Mouth Rinse, BID, YRush Farmer MD, 15 mL at 01/12/19 2007 .  multivitamin with minerals tablet 1 tablet, 1 tablet, Oral, Daily, YRush Farmer MD, 1 tablet at 01/14/19 0844 .  oxyCODONE-acetaminophen (PERCOCET/ROXICET) 5-325 MG per tablet 2 tablet, 2 tablet, Oral, Q6H PRN, Arrien, MJimmy Picket MD, 2 tablet at 01/14/19 0845 .  pantoprazole (PROTONIX) EC tablet 40 mg, 40 mg, Oral, BID, Lore, Melissa A, RPH, 40 mg at 01/14/19 0845 .  potassium chloride SA (K-DUR,KLOR-CON) CR tablet 60 mEq, 60 mEq, Oral, Once, SGeorgiana Shore NP .  predniSONE (DELTASONE) tablet 10 mg, 10 mg, Oral, Q breakfast, Lore, Melissa A, RPH, 10 mg at 01/13/19 0832 .  rivaroxaban (XARELTO) tablet 20 mg, 20 mg, Oral, Q supper, TShirley Friar PA-C, 20 mg at 01/13/19 1714 .  rosuvastatin (CRESTOR) tablet 20 mg, 20 mg,  Oral, q1800, Rush Farmer, MD, 20 mg at 01/13/19 1714 .  sodium chloride flush (NS) 0.9 % injection 3 mL, 3 mL, Intravenous, Q12H, Shirley Friar, PA-C, 3 mL at 01/11/19 0912 .  sodium chloride flush (NS) 0.9 % injection 3 mL, 3 mL, Intravenous, PRN, Shirley Friar, PA-C  Patients Current Diet:   Diet Order            Diet heart healthy/carb modified Room service appropriate? Yes; Fluid consistency: Thin  Diet effective now              Precautions / Restrictions Precautions Precautions: Fall Precaution Comments: 3-4L O2 [place] baseline Restrictions Weight Bearing Restrictions: No   Has the patient had 2 or more falls or a fall with injury in the past year?Yes  Prior Activity Level Limited Community (1-2x/wk): sedentary, Mod I with RW; does not drive  Prior Functional Level Do you want Prior Function Level of Independence: Independent with assistive device(s) Gait / Transfers Assistance Needed: uses RW for mobility ADL's / Homemaking Assistance Needed: assist from wife for LB ADL and wife does IADL Comments: denies falls between  admssions. was working with [place] from other? Self Care: Did the patient need help bathing, dressing, using the toilet or eating?  Needed some help  Indoor Mobility: Did the patient need assistance with walking from room to room (with or without device)? Independent  Stairs: Did the patient need assistance with internal or external stairs (with or without device)? Needed some help  Functional Cognition: Did the patient need help planning regular tasks such as shopping or remembering to take medications? Needed some help  Home Assistive Devices / [place] Devices/Equipment: Environmental consultant (specify type), Eyeglasses, Cane (specify quad or straight) Home Equipment: Walker - 2 wheels, Cane - single point, Bedside commode, Shower seat  Prior Device Use: Indicate devices/aids used by the patient prior to current illness, exacerbation or injury? Walker  Prior Functional Level Comments: denies falls between [employer]. was working with HHPT   Prior Functional Level Current Functional Level  Bed Mobility  Needs Assistance  Transfers   Needs assistance  Mobility - Walk/Wheelchair     Mobility - Ambulation/Gait  Min guard  Upper Body Dressing  Minimal assistance, Sitting  Lower Body Dressing  Maximal assistance, Sit to/from stand  Grooming  Set up, Sitting  Eating/Drinking  Set up, Sitting  Toilet Transfer  Moderate assistance, +2 for physical assistance, +2 for safety/equipment, Stand-pivot, BSC  Bladder Continence     Bowel Management      Stair Climbing     Communication  No difficulties  Memory     Cooking/Meal Prep        Housework     Money Management     Driving       Special needs/care consideration BiPAP/CPAP usign CPAP this admit which is new; DId not have at hopme pta CPM n/a Continuous Drip IV n/a Dialysis n/a Life Vest n/a Oxygen 3 to 4 liters nasal cannula at baseline 24/7 at home pta Special Bed n/a Trach Size n/a Wound Vac n/a Skin MASD groin and  perineum; skin tear to penis, stage 1 healing to buttocks Bowel mgmt: continent LBM 3/12 Bladder mgmt: external catheter; incontinent Diabetic mgmt yes  Previous Home Environment Living Arrangements: Spouse/significant other  Lives With: Spouse Available Help at Discharge: Family, Available 24 hours/day Type of Home: House Home Layout: One level Home Access: Stairs to enter Entrance Stairs-Rails: None Entrance Stairs-Number of Steps: 1  Bathroom Shower/Tub: Optometrist: Yes Home Care Services: No Additional Comments: on 3L of O2 at all times at home  Discharge Living Setting Plans for Discharge Living Setting: Patient's home, Lives with (comment)(wife) Type of Home at Discharge: House Discharge Home Layout: One level Discharge Home Access: Stairs to enter Entrance Stairs-Rails: None Entrance Stairs-Number of Steps: 1 Discharge Bathroom Shower/Tub: Tub/shower unit Discharge Bathroom Toilet: Standard Discharge Bathroom Accessibility: Yes How Accessible: Accessible via walker Does the patient have any problems obtaining your medications?: No  Social/Family/Support Systems Patient Roles: Spouse, Parent Contact Information: wife, patricia Anticipated Caregiver: wife Anticipated Caregiver's Contact Information: see above Ability/Limitations of Caregiver: no limitations Caregiver Availability: 24/7 Discharge Plan Discussed with Primary Caregiver: Yes Is Caregiver In Agreement with Plan?: Yes Does Caregiver/Family have Issues with Lodging/Transportation while Pt is in Rehab?: No(wife has been staying the night, coming in the evenings and )  Goals/Additional Needs Patient/Family Goal for Rehab: supervision to min PT and OT Expected length of stay: ELOS 14 to 20 days Additional Information: Patient using CPAP new this admission; did not have one pta Pt/Family Agrees to Admission and willing to participate: Yes Program  Orientation Provided & Reviewed with Pt/Caregiver Including Roles  & Responsibilities: Yes   Used [facility] pta  Patient Condition: I have reviewed medical records from [facility] spoken with patient and  spouse. I met with patient and spouse at the bedside inpatient rehabilitation assessment.  Patient will benefit from ongoing PT and  OT, can actively participate in 3 hours of therapy a day 5 days of the week, and can make measurable gains during the admission.  Patient will also benefit from the coordinated team approach during an Inpatient Acute Rehabilitation admission.  The patient will receive intensive therapy as well as Rehabilitation physician, nursing, social worker, and care management interventions.  Due to bowel management, bladder management, safety, skin/wound care, disease management, medical administration, pain management, patient education the patient requires 24 hour a day rehabilitation nursing.  The patient is currently mod to max with mobility and basic ADLs.  Discharge setting and therapy post discharge at  home with home health is anticipated.  Patient has agreed to participate in the Acute Inpatient Rehabilitation Program and will admit today.  Preadmission Screen Completed By:  Cleatrice Burke RN MSN, 01/14/2019 12:03 PM ______________________________________________________________________   Discussed status with Dr. Naaman Plummer  on  01/14/2019  at  1202 and received telephone approval for admission today.  Admission Coordinator:  Cleatrice Burke, time  5003 Date  01/14/2019   Assessment/Plan: Diagnosis: debility 1. Does the need for close, 24 hr/day  Medical supervision in concert with the patient's rehab needs make it unreasonable for this patient to be served in a less intensive setting? Yes 2. Co-Morbidities requiring supervision/potential complications: morbid obesity, PAF, CAD, CHF, COPD 3. Due to bladder management, bowel management, safety,  skin/wound care, disease management, medication administration, pain management and patient education, does the patient require 24 hr/day rehab nursing? Yes 4. Does the patient require coordinated care of a physician, rehab nurse, PT (1-2 hrs/day, 5 days/week) and OT (1-2 hrs/day, 5 days/week) to address physical and functional deficits in the context of the above medical diagnosis(es)? Yes Addressing deficits in the following areas: balance, endurance, locomotion, strength, transferring, bowel/bladder control, bathing, dressing, feeding, grooming, toileting and psychosocial support 5. Can the patient actively participate in an intensive therapy program of at least 3 hrs of therapy 5 days a week? Yes  6. The potential for patient to make measurable gains while on inpatient rehab is excellent 7. Anticipated functional outcomes upon discharge from inpatients are: supervision and min assist PT, supervision and min assist OT, n/a SLP 8. Estimated rehab length of stay to reach the above functional goals is: 14-20 days 9. Anticipated D/C setting: Home 10. Anticipated post D/C treatments: [place] therapy 11. Overall Rehab/Functional Prognosis: excellent  Meredith Staggers, MD, [facility] 01/14/2019   Cleatrice Burke RN MSN Admissions Coordinaotr 01/14/2019

## 2019-01-14 NOTE — Progress Notes (Addendum)
Patient ID: Nicholas Caldwell, male   DOB: 06-05-1946, 73 y.o.   MRN: [id number]     Advanced Heart Failure Rounding Note  PCP-Cardiologist: No primary care provider on file.   Subjective:    No longer febrile. PM dose of torsemide held last night. Creatinine remains elevated 1.45 -> 1.48. K 3.3. Weight unchanged.   Underwent TEE/DCCV 01/10/19.   SOB is okay. Denies bleeding. Sitting on side of bed. Had urinary incontinence. Wore CPAP last night.   Planning for CIR at DC.   Objective:   Weight Range: 116.6 kg Body mass index is 34.86 kg/m.   Vital Signs:   Temp:  [97.5 F (36.4 C)-98.8 F (37.1 C)] 97.5 F (36.4 C) (03/13 0551) Pulse Rate:  [71-76] 76 (03/13 0551) Resp:  [18] 18 (03/13 0551) BP: (93-106)/(37-64) 96/37 (03/13 0551) SpO2:  [91 %-94 %] 94 % (03/13 0551) Weight:  [116.6 kg] 116.6 kg (03/13 0244) Last BM Date: 01/01/19  Weight change: Filed Weights   01/13/19 0402 01/13/19 0609 01/14/19 0244  Weight: 117.8 kg 116.8 kg 116.6 kg   Intake/Output:   Intake/Output Summary (Last 24 hours) at 01/14/2019 0826 Last data filed at 01/13/2019 2352 Gross per 24 hour  Intake 720 ml  Output 1351 ml  Net -631 ml    Physical Exam   General: No resp difficulty. HEENT: Normal Neck: Supple. JVP 8-9. Carotids 2+ bilat; no bruits. No thyromegaly or nodule noted. Cor: PMI nondisplaced. RRR, No M/G/R noted Lungs: fine crackles in bases Abdomen: Soft, non-tender, non-distended, no HSM. No bruits or masses. +BS  Extremities: No cyanosis, clubbing, or rash. R and LLE trace edema.  Neuro: Alert & orientedx3, cranial nerves grossly intact. moves all 4 extremities w/o difficulty. Affect pleasant  Telemetry   NSR 70s. Personally reviewed.   EKG   No new tracings.   Labs    CBC Recent Labs    01/12/19 0425 01/13/19 0554  WBC 16.1* 13.5*  HGB 7.6* 7.5*  HCT 26.8* 26.3*  MCV 91.8 88.9  PLT 322 517   Basic Metabolic Panel Recent Labs    01/12/19 0425 01/13/19 0554  01/14/19 0506  NA 154* 149* 147*  K 3.0* 3.3* 3.3*  CL 113* 107 105  CO2 36* 32 30  GLUCOSE 142* 219* 268*  BUN 65* 55* 49*  CREATININE 1.18 1.45* 1.48*  CALCIUM 8.7* 8.5* 8.3*  MG 2.4  --   --   PHOS 3.6  --   --    Liver Function Tests No results for input(s): AST, ALT, ALKPHOS, BILITOT, PROT, ALBUMIN in the last 72 hours. No results for input(s): LIPASE, AMYLASE in the last 72 hours. Cardiac Enzymes No results for input(s): CKTOTAL, CKMB, CKMBINDEX, TROPONINI in the last 72 hours.  BNP: BNP (last 3 results) Recent Labs    11/21/18 2238 12/01/18 0015 12/30/18 1621  BNP 1,739.7* 1,666.7* 1,273.0*    ProBNP (last 3 results) No results for input(s): PROBNP in the last 8760 hours.   D-Dimer No results for input(s): DDIMER in the last 72 hours. Hemoglobin A1C No results for input(s): HGBA1C in the last 72 hours. Fasting Lipid Panel No results for input(s): CHOL, HDL, LDLCALC, TRIG, CHOLHDL, LDLDIRECT in the last 72 hours. Thyroid Function Tests No results for input(s): TSH, T4TOTAL, T3FREE, THYROIDAB in the last 72 hours.  Invalid input(s): FREET3  Other results:   Imaging    No results found.   Medications:     Scheduled Medications: . bisoprolol  2.5 mg  Oral Daily  . cloNIDine  0.1 mg Oral Daily  . docusate sodium  50 mg Oral BID  . folic acid  1 mg Oral Daily  . gabapentin  600 mg Oral BID  . insulin aspart  0-15 Units Subcutaneous TID WC  . insulin aspart  0-5 Units Subcutaneous QHS  . insulin detemir  15 Units Subcutaneous Daily  . isosorbide-hydrALAZINE  0.5 tablet Oral TID  . latanoprost  1 drop Both Eyes QHS  . mouth rinse  15 mL Mouth Rinse BID  . multivitamin with minerals  1 tablet Oral Daily  . pantoprazole  40 mg Oral BID  . predniSONE  10 mg Oral Q breakfast  . rivaroxaban  20 mg Oral Q supper  . rosuvastatin  20 mg Oral q1800  . sodium chloride flush  3 mL Intravenous Q12H  . torsemide  80 mg Oral BID    Infusions: . sodium  chloride 10 mL/hr at 01/13/19 0500  . sodium chloride Stopped (01/10/19 1414)  . sodium chloride      PRN Medications: acetaminophen (TYLENOL) oral liquid 160 mg/5 mL, bisacodyl, ipratropium-albuterol, labetalol, oxyCODONE-acetaminophen, sodium chloride flush    Patient Profile   Nicholas Caldwell a 73 y.o.malePMH of chronic systolic CHF/nonischemic cardiomyopathy Echo 12/13/2016 LVEF 40-45%, COPD on home oxygen, PAF and history of non compliance.   Admitted with A/C respiratory failure and A/C systolic heart failure.  Assessment/Plan   1. Acute/chronic systolic CHF: Nonischemic cardiomyopathy based on cath in 2/20. Echo this admission with EF 45-50% but severe RV dilation/dysfunction. Severe COPD/OHS/OSAon home oxygen is a confounder and likely cause of RV failure/cor pulmonale. TEE with EF 40-45%, moderate LVH, severe RV dilation with moderately decreased systolic function.  - Volume okay, trending up on exam. Weight unchanged.  - Creatinine still elevated. Hold torsemide at least this morning.  - Continue Bidil 1/2 tab tid.  - Continue bisoprolol 2.5 mg daily. - He is on clonidine as part of a clinical trial, titrate per protocol.  - Think he would be a good Cardiomems candidateeventually. 2. Atrial fibrillation: Paroxysmal. He went into atrial fibrillation after intubation. On Xarelto at home. - Has not been felt to be a candidate for Tikosyn and amiodarone due to QT prolongation and severe lung disease. - Back on Xarelto.  - Maintaining NSR s/p TEE/DCCV.  3. Pulmonary: Has COPD, wears home oxygen at baseline. Suspect COPD exacerbation with LLL PNA. Completed Abx. Now afebrile.  -Continue nebs and steroids per CCM. - Completed course of ceftazidime for HCAP.  - Extubated 01/11/2019. Continue CPAP qHS.  4. Anemia: H/o GI bleeding. Hemoglobin stable in setting of coffee grounds emesis, now resolved.   - Continue PPI. - He is back on Xarelto. No CBC this am. Hgb  stable 7.5 yesterday. Recheck tomorrow. Denies bleeding.  5. OHS/OSA: Hypercarbic respiratory failure in setting of OHS/OSA and COPD. OSA diagnosed on sleep study, but having a lot of trouble getting CPAP from the [place]. Will need to see if we can get outside of [place]. Suspect that OHS/OSA plays a large role in his RV failure via hypoxic pulmonary vasoconstriction.  - Will need CPAP long-term. 6. Hypernatremia:  - Na improving 147 - This should correct now that extubated and taking po.  7. AKI on CKD III - 1.2 -> 1.5 -> 1.18 -> 1.45 -> 1.48 8. Deconditioning - Planning for CIR at DC. CIR consulted.   Georgiana Shore, NP  01/14/2019 8:26 AM   Advanced Heart Failure Team Pager  [phone] (M-F; 7a - 4p)  Please contact [facility] for night-coverage after hours (4p -7a ) and weekends on [email or web address]   Patient seen with PA, agree with the above note.   Patient stable today, remains in NSR.  Volume status looks ok.  No longer febrile, has finished ceftazidime.   Creatinine stable today, agree with holding am torsemide and restarting torsemide 80 mg bid this afternoon.   Hemoglobin low but stable.  No overt bleeding.  Continue on Xarelto 20 mg daily.   Hypernatremia improving now that extubated and taking po.   With OHS/OSA, should get set up for CPAP at home. Would continue in hospital.   Mobilize, noted possible plans for inpatient rehab. He is ok to go to CIR from my standpoint.   Loralie Champagne 01/14/2019 1:10 PM

## 2019-01-14 NOTE — Progress Notes (Addendum)
PROGRESS NOTE  Nicholas Caldwell VPX:[id number] DOB: [birth year] DOA: 12/30/2018 PCP: [facility]  HPI/Brief Narrative 73 year old male who initially presented to the ED 2/27 for CHF exacerbation, after his wife found him lethargic and shaky. On 3/3, he developed hypoxia, hypercapnia, and altered mental status; no improvement with furosemide and naloxone, so he was intubated and transferred to the ICU. He developed Afib and was cardioverted on 3/9. Has held NSR since then. He was extubated 3/10 and transferred to the medical ward 3/11. He is now anticipating discharge to inpatient rehab. At baseline he uses a walker.   Subjective/ROS Today, patient refuses to discuss review of symptoms, saying that he was "tricked into taking a survey" and he doesn't trust me. He believes he is being forcibly discharged. Despite reassurance that he has not taken any surveys, and that he is being discharged to inpatient rehab so that he can get back to his baseline functionality, he defers answering any questions and defers all physical exam.   Assessment/Plan: Acute on chronic hypoxic/hypercapnic respiratory failure, complicated with toxic encephalopathy Patient is awake and alert, tolerating supplemental O2 per nasal cannula at 2L. Oxygenating at 94%. Continue neuro checks per unit protocol.  Acute on chronic anemia of chronic disease Pt's Hb has been trending down- 7.3 today, from 7.5 yesterday. He will receive 1 unit of PRBCs. Plan to discharge to inpatient rehab pending Hb in normal range on recheck.   Acute on chronic systolic heart failure Total of 20L fluid diuresed during this hospitalization, urine 1.35L over last 24 hours. Continue Bisoprolol and Bidil. Clinically improved pleural effusions. Due to hypokalemia and clinically euvolemic status, continue holding Torsemide.  Hypertension BP running low-normal on current regimen- Bisoprolol, clonidine, isosorbide-hydralazine, labetalol. Torsemide  currently being held.   Paroxysmal atrial fibrillation Continue Bisoprolol for rate control, Rivaroxaban for anticoagulation.  Type 2 diabetes mellitus Continue sliding scale insulin.   Pneumonia (present on admission) Pt initially presented with left lower lobe pneumonia. Improvement with diuresis and course of Cefazolin; Cefazolin stopped yesterday.  Chronic back and hip pain Pt on long-term oxycodone; continue this as needed. Oxycodone 90m q6h.   AKI on CKD stage 2, with hypokalemia and hypernatremia.  Renal function continues to decline- Cr 1.48 from 1.18 2 days prior. Continue potassium correction, continue to hold torsemide. Continue to follow renal panel and avoid nephrotoxic medications.   DVT prophylaxis: scd Code Status: full Family Communication: wife at bedside  Disposition Plan/ discharge barriers: patient medically stable to be transfer to CIR.   Objective: Vitals:   01/13/19 2010 01/13/19 2112 01/14/19 0244 01/14/19 0551  BP: (!) 93/51 99/64  (!) 96/37  Pulse: 71   76  Resp: 18   18  Temp: 98.3 F (36.8 C)   (!) 97.5 F (36.4 C)  TempSrc: Oral     SpO2: 91%   94%  Weight:   116.6 kg   Height:        Intake/Output Summary (Last 24 hours) at 01/14/2019 1349 Last data filed at 01/13/2019 2352 Gross per 24 hour  Intake 480 ml  Output 1351 ml  Net -871 ml   Filed Weights   01/13/19 0402 01/13/19 0609 01/14/19 0244  Weight: 117.8 kg 116.8 kg 116.6 kg    Exam:  Constitutional:normal appearing male HEENT: grossly normal Cardiovascular: patient defers exam Respiratory: patient defers exam Abdomen: patient defers exam Skin: grossly normal Neurologic: Grossly no focal neuro deficit. Psychiatric: distrustful and anxious   Data Reviewed: CBC: Recent Labs  Lab 01/09/19 0434 01/10/19 0403 01/11/19 0409 01/11/19 0440 01/12/19 0425 01/13/19 0554 01/14/19 1201  WBC 13.6* 15.2* 15.6*  --  16.1* 13.5*  --   HGB 9.1* 9.0* 8.4* 9.9* 7.6* 7.5* 7.3*  HCT  32.1* 31.7* 30.1* 29.0* 26.8* 26.3* 25.6*  MCV 89.7 90.8 91.5  --  91.8 88.9  --   PLT 377 359 346  --  322 292  --    Basic Metabolic Panel: Recent Labs  Lab 01/07/19 1640  01/09/19 2240 01/10/19 0403 01/11/19 0409 01/11/19 0440 01/12/19 0425 01/13/19 0554 01/14/19 0506  NA  --    < > 158* 156* 159* 160* 154* 149* 147*  K  --    < > 3.3* 3.4* 3.9 3.8 3.0* 3.3* 3.3*  CL  --    < > 114* 113* 115*  --  113* 107 105  CO2  --    < > 37* 35* 34*  --  36* 32 30  GLUCOSE  --    < > 154* 248* 200*  --  142* 219* 268*  BUN  --    < > 69* 70* 85*  --  65* 55* 49*  CREATININE  --    < > 1.22 1.24 1.51*  --  1.18 1.45* 1.48*  CALCIUM  --    < > 9.2 9.2 9.1  --  8.7* 8.5* 8.3*  MG 2.3  --  2.5* 2.6* 2.5*  --  2.4  --   --   PHOS 3.4  --   --   --  3.2  --  3.6  --   --    < > = values in this interval not displayed.   GFR: Estimated Creatinine Clearance: 61.2 mL/min (A) (by C-G formula based on SCr of 1.48 mg/dL (H)). Liver Function Tests: No results for input(s): AST, ALT, ALKPHOS, BILITOT, PROT, ALBUMIN in the last 168 hours. No results for input(s): LIPASE, AMYLASE in the last 168 hours. No results for input(s): AMMONIA in the last 168 hours. Coagulation Profile: No results for input(s): INR, PROTIME in the last 168 hours. Cardiac Enzymes: No results for input(s): CKTOTAL, CKMB, CKMBINDEX, TROPONINI in the last 168 hours. BNP (last 3 results) No results for input(s): PROBNP in the last 8760 hours. HbA1C: No results for input(s): HGBA1C in the last 72 hours. CBG: Recent Labs  Lab 01/13/19 1136 01/13/19 1631 01/13/19 2101 01/14/19 0600 01/14/19 1145  GLUCAP 239* 246* 271* 259* 332*   Lipid Profile: No results for input(s): CHOL, HDL, LDLCALC, TRIG, CHOLHDL, LDLDIRECT in the last 72 hours. Thyroid Function Tests: No results for input(s): TSH, T4TOTAL, FREET4, T3FREE, THYROIDAB in the last 72 hours. Anemia Panel: No results for input(s): VITAMINB12, FOLATE, FERRITIN, TIBC,  IRON, RETICCTPCT in the last 72 hours. Urine analysis:    Component Value Date/Time   COLORURINE YELLOW 12/30/2018 1759   APPEARANCEUR CLEAR 12/30/2018 1759   LABSPEC 1.015 12/30/2018 1759   PHURINE 5.0 12/30/2018 1759   GLUCOSEU NEGATIVE 12/30/2018 1759   HGBUR NEGATIVE 12/30/2018 1759   BILIRUBINUR NEGATIVE 12/30/2018 1759   KETONESUR NEGATIVE 12/30/2018 1759   PROTEINUR 30 (A) 12/30/2018 1759   UROBILINOGEN 0.2 10/26/2014 2206   NITRITE NEGATIVE 12/30/2018 1759   LEUKOCYTESUR NEGATIVE 12/30/2018 1759   Sepsis Labs: _0 (procalcitonin:4,lacticidven:4)  ) Recent Results (from the past 240 hour(s))  Culture, respiratory (non-expectorated)     Status: None   Collection Time: 01/04/19  4:56 PM  Result Value Ref Range Status  Specimen Description TRACHEAL ASPIRATE  Final   Special Requests NONE  Final   Gram Stain   Final    ABUNDANT WBC PRESENT,BOTH PMN AND MONONUCLEAR NO ORGANISMS SEEN    Culture   Final    Consistent with normal respiratory flora. Performed at [facility], [place] [address], [place], [place] [address]    Report Status 01/06/2019 FINAL  Final      Studies: No results found.  Scheduled Meds: . sodium chloride   Intravenous Once  . bisoprolol  2.5 mg Oral Daily  . docusate sodium  50 mg Oral BID  . folic acid  1 mg Oral Daily  . gabapentin  600 mg Oral BID  . insulin aspart  0-15 Units Subcutaneous TID WC  . insulin aspart  0-5 Units Subcutaneous QHS  . insulin detemir  15 Units Subcutaneous Daily  . isosorbide-hydrALAZINE  0.5 tablet Oral TID  . latanoprost  1 drop Both Eyes QHS  . mouth rinse  15 mL Mouth Rinse BID  . multivitamin with minerals  1 tablet Oral Daily  . pantoprazole  40 mg Oral BID  . potassium chloride  60 mEq Oral Once  . predniSONE  10 mg Oral Q breakfast  . rivaroxaban  20 mg Oral Q supper  . rosuvastatin  20 mg Oral q1800  . sodium chloride flush  3 mL Intravenous Q12H  . torsemide  80 mg Oral BID     Continuous Infusions: . sodium chloride 10 mL/hr at 01/13/19 0500  . sodium chloride Stopped (01/10/19 1414)  . sodium chloride       LOS: 15 days    Marin Roberts, PA-S 01/14/19

## 2019-01-14 NOTE — H&P (Signed)
Physical Medicine and Rehabilitation Admission H&P    Chief Complaint  Patient presents with  . Shortness of Breath  : Chief complaint: Weakness  HPI: Nicholas Caldwell is a 73 year old right-handed male history of atrial flutter/PAF and continues on Xarelto, CAD,chronic back pain maintained on Neurontin as well as oxycodone as needed,chronic anemia, chronic diastolic congestive heart failure, diabetes mellitus maintained on Glucophage 1000 mg twice a day and NovoLog 70/30, hypertension maintained on Bidil, COPD/tobacco abuse and used oxygen at home. Per chart review patient lives with spouse. One level home with one step to entry. Used a rolling walker for mobility. Wife did assist with some lower body ADLs. Presented 12/30/2018 with increasing shortness of breath and approximately 12 pounds of weight gain over the last 4 days. Increase edema lower extremities. Chest x-ray showed no interval change and congestive heart failure with edema and bilateral effusions. CT the chest showed small right pleural effusion as well as several small pulmonary nodules largest pulmonary nodule measuring 8 mm in the left upper lobe noncontrast CT of the chest recommended 3-6 months. Ultrasound the abdomen negative for significant ascites. Urine drug screen was positive for opiates and benzos. Acute on chronic anemia hemoglobin 8.7. EKG atrial fibrillationwith history of PAF maintained on Xarelto. Follow-up cardiology services. Patient did require DCCV 01/10/2019 and remained on intravenous heparin for a short time. Noted with ongoing acute respiratory failure related to CHF requiring intubation 01/04/2019 through 01/11/2019.  Diet has been advanced to regular consistency.therapy evaluations completed with recommendations of physical medicine rehabilitation consult. Patient was admitted for a comprehensive rehabilitation program.  Review of Systems  Constitutional:       Weight gain 10-12 pounds over 4-5 days  HENT:  Negative for hearing loss.   Eyes: Negative for blurred vision and double vision.  Respiratory: Positive for shortness of breath. Negative for cough and wheezing.   Cardiovascular: Positive for chest pain, palpitations and leg swelling.  Gastrointestinal: Positive for constipation. Negative for heartburn, nausea and vomiting.  Genitourinary: Positive for urgency. Negative for dysuria, flank pain and hematuria.  Musculoskeletal: Positive for back pain.  Skin: Negative for rash.  Psychiatric/Behavioral: The patient has insomnia.    Past Medical History:  Diagnosis Date  . Atrial flutter ([place])    a. recurrent AFlutter with RVR;  b. Amiodarone Rx started 4/16  . CAD (coronary artery disease)    a. LHC 1/16:  mLAD diffuse disease, pLCx mild disease, dLCx with disease but too small for PCI, RCA ok, EF 25-30%  . Chronic pain   . Chronic systolic CHF (congestive heart failure) ([place])   . COPD (chronic obstructive pulmonary disease) ([place])   . Diabetes mellitus without complication ([place])   . Hypercholesteremia   . Hypertension   . NICM (nonischemic cardiomyopathy) ([place])    a.dx 2016. b. 2D echo 06/2016 - Last echo 07/01/16: mod dilated LV, mod LVH, EF 25-30%, mild-mod MR, sev LAE, mild-mod reduced RV systolic function, mild-mod TR, PASP 19mHG.  .Marland KitchenPAF (paroxysmal atrial fibrillation) ([facility])    On amio - ot a candidate for flecainide due to cardiomyopathy, not a candidate for Tikosyn due to prolonged QT, and felt to be a poor candidate for ablation given left atrial size.  . Pulmonary hypertension ([place]   . Tobacco abuse    Past Surgical History:  Procedure Laterality Date  . CARDIOVERSION N/A 07/30/2018   Procedure: CARDIOVERSION;  Surgeon: MLarey Dresser MD;  [facility];  Service: Cardiovascular;  Laterality: N/A;  .  LEFT AND RIGHT HEART CATHETERIZATION WITH CORONARY ANGIOGRAM N/A 11/06/2014   Procedure: LEFT AND RIGHT HEART CATHETERIZATION WITH CORONARY ANGIOGRAM;  Surgeon: Jettie Booze, MD;  Location: [facility] C[facility] LAB;  Service: Cardiovascular;  Laterality: N/A;  . RIGHT/LEFT HEART CATH AND CORONARY ANGIOGRAPHY N/A 12/06/2018   Procedure: RIGHT/LEFT HEART CATH AND CORONARY ANGIOGRAPHY;  Surgeon: Larey Dresser, MD;  Location: [place] CV LAB;  Service: Cardiovascular;  Laterality: N/A;  . SPLENECTOMY     Family History  Problem Relation Age of Onset  . Heart disease Mother   . Hypertension Mother   . Heart failure Mother   . Heart disease Father   . Hypertension Sister   . Heart attack Neg Hx   . Stroke Neg Hx    Social History:  reports that he quit smoking about 6 weeks ago. His smoking use included cigarettes. He has a 34.00 pack-year smoking history. He has never used smokeless tobacco. He reports that he does not drink alcohol or use drugs. Allergies: No Known Allergies Medications Prior to Admission  Medication Sig Dispense Refill  . bisoprolol (ZEBETA) 5 MG tablet Take 0.5 tablets (2.5 mg total) by mouth daily for 30 days. 15 tablet 3  . budesonide-formoterol (SYMBICORT) 160-4.5 MCG/ACT inhaler Inhale 2 puffs into the lungs 2 (two) times daily.    . diazepam (VALIUM) 10 MG tablet Take 0.5 tablets (5 mg total) by mouth daily as needed for anxiety or sleep. (Patient taking differently: Take 10 mg by mouth daily as needed for anxiety or sleep. ) 30 tablet 0  . ferrous sulfate 325 (65 FE) MG tablet Take 325 mg by mouth 3 (three) times daily as needed (for supplementation).    . fluticasone (FLONASE) 50 MCG/ACT nasal spray Place 2 sprays into both nostrils daily as needed for allergies.     . folic acid (FOLVITE) 1 MG tablet Take 1 mg by mouth daily.    Marland Kitchen gabapentin (NEURONTIN) 600 MG tablet Take 1 tablet (600 mg total) by mouth 2 (two) times daily. 60 tablet 0  . glucose 4 GM chewable tablet Chew 4 tablets by mouth See admin instructions. Chew 4 tablets by mouth as needed for low blood sugar - repeat every 15 minutes if blood sugar less than 70    .  guaifenesin (HUMIBID E) 400 MG TABS tablet Take 1 tablet (400 mg total) by mouth every 6 (six) hours as needed. (Patient taking differently: Take 400 mg by mouth every 6 (six) hours as needed (cough). ) 10 tablet 0  . hydrocortisone (ANUSOL-HC) 2.5 % rectal cream Place 1 application rectally 2 (two) times daily as needed for hemorrhoids or itching.     . hydrocortisone 1 % lotion Apply 1 application topically See admin instructions. Apply small amount to back twice daily for itchy rash    . insulin aspart protamine - aspart (NOVOLOG MIX 70/30 FLEXPEN) (70-30) 100 UNIT/ML FlexPen Inject 0.05 mLs (5 Units total) into the skin 2 (two) times daily. (Patient taking differently: Inject 30 Units into the skin 2 (two) times daily. ) 15 mL 11  . ipratropium-albuterol (DUONEB) 0.5-2.5 (3) MG/3ML SOLN Take 3 mLs by nebulization 2 (two) times daily.    . isosorbide-hydrALAZINE (BIDIL) 20-37.5 MG tablet Take 1 tablet by mouth 3 (three) times daily. 90 tablet 3  . latanoprost (XALATAN) 0.005 % ophthalmic solution Place 1 drop into both eyes at bedtime.    . Magnesium Oxide 420 (252 Mg) MG TABS Take 420 mg by  mouth 2 (two) times daily.    . metFORMIN (GLUCOPHAGE-XR) 500 MG 24 hr tablet Take 1,000 mg by mouth 2 (two) times daily with a meal.    . oxyCODONE-acetaminophen (PERCOCET) 10-325 MG tablet Take 1 tablet by mouth every 6 (six) hours as needed for pain.    . OXYGEN Inhale 3 L into the lungs continuous.     . potassium chloride SA (K-DUR,KLOR-CON) 20 MEQ tablet Take 2 tablets (40 mEq total) by mouth every morning AND 1 tablet (20 mEq total) at bedtime. (Patient taking differently: Take 2 tablets (40 mEq total) by mouth every morning  AND 1 tablet (20 mEq total) at bedtime.) 90 tablet 0  . rivaroxaban (XARELTO) 20 MG TABS tablet Take 1 tablet (20 mg total) by mouth daily with supper. (Patient taking differently: Take 20 mg by mouth daily at 6 PM. ) 30 tablet 4  . rosuvastatin (CRESTOR) 20 MG tablet Take 1 tablet  (20 mg total) by mouth daily. 30 tablet 0  . torsemide (DEMADEX) 20 MG tablet Take 4 tablets (80 mg total) by mouth 2 (two) times daily for 30 days. 240 tablet 0    Drug Regimen Review Drug regimen was reviewed and remains appropriate with no significant issues identified  Home: Home Living Family/patient expects to be discharged to:: Private residence Living Arrangements: Spouse/significant other Available Help at Discharge: Family, Available 24 hours/day Type of Home: House Home Access: Stairs to enter [employer] of Steps: 1 Entrance Stairs-Rails: None Home Layout: One level Bathroom Shower/Tub: Chiropodist: Standard Bathroom Accessibility: Yes Home Equipment: Environmental consultant - 2 wheels, Cane - single point, Bedside commode, Shower seat Additional Comments: on 3L of O2 at all times at home  Lives With: (P) Spouse   Functional History: Prior Function Level of Independence: (P) Independent with assistive device(s) Gait / Transfers Assistance Needed: uses RW for mobility ADL's / Homemaking Assistance Needed: assist from wife for LB ADL and wife does IADL Comments: denies falls between [place]. was working with HHPT  Functional Status:  Mobility: Bed Mobility Overal bed mobility: Needs Assistance Bed Mobility: Supine to Sit Supine to sit: Mod assist Sit to supine: Max assist General bed mobility comments: sitting EOB when OT entered Transfers Overall transfer level: Needs assistance Equipment used: Rolling walker (2 wheeled), 1 person hand held assist Transfers: Sit to/from Stand, Stand Pivot Transfers Sit to Stand: Min assist, From elevated surface Stand pivot transfers: Mod assist, +2 physical assistance, +2 safety/equipment, From elevated surface General transfer comment: initially from elevated EOB required mod A +2 with HHA, [facility] Pt min A woth good hand placement Ambulation/Gait Ambulation/Gait assistance: Min guard Gait Distance (Feet):  5 Feet Assistive device: Rolling walker (2 wheeled), 4-wheeled walker Gait Pattern/deviations: Trunk flexed General Gait Details: sidesteps to chair with cues for upright posture and controlling balance with RW Gait velocity: decreased Gait velocity interpretation: <1.31 ft/sec, indicative of household ambulator    ADL: ADL Overall ADL's : Needs assistance/impaired Eating/Feeding: Set up, Sitting Grooming: Set up, Sitting Upper Body Bathing: Sitting, Moderate assistance Lower Body Bathing: Maximal assistance, Sit to/from stand Upper Body Dressing : Minimal assistance, Sitting Upper Body Dressing Details (indicate cue type and reason): to don hospital gown Lower Body Dressing: Maximal assistance, Sit to/from stand Lower Body Dressing Details (indicate cue type and reason): Pt interested in AE for LB ADL - "the plastic bag kit from the store here" encouraged to purchase, and bring up to room so that we can practice with  his Tax inspector: Moderate assistance, +2 for physical assistance, +2 for safety/equipment, Stand-pivot, BSC Toilet Transfer Details (indicate cue type and reason): without DME Pt required significantly increase assist for transfer Toileting- Clothing Manipulation and Hygiene: Maximal assistance, Sit to/from stand Toileting - Clothing Manipulation Details (indicate cue type and reason): for rear peri care - Pt bent over on walker Functional mobility during ADLs: Minimal assistance, Rolling walker General ADL Comments: pt stating he would be purchasing AE for LB ADL prior to going home, recalled use from previous OT visit  Cognition: Cognition Overall Cognitive Status: Within Functional Limits for tasks assessed Orientation Level: Oriented X4 Cognition Arousal/Alertness: Awake/alert Behavior During Therapy: WFL for tasks assessed/performed, Agitated Overall Cognitive Status: Within Functional Limits for tasks assessed Area of Impairment: Safety/judgement,  Awareness, Problem solving Current Attention Level: Selective Following Commands: Follows one step commands with increased time Safety/Judgement: Decreased awareness of safety, Decreased awareness of deficits Problem Solving: Slow processing, Requires verbal cues General Comments: patient with por insight, wanting to walk hallway tonight when max A to stand and too weak to step off bed.   Physical Exam: Blood pressure (!) 96/37, pulse 76, temperature (!) 97.5 F (36.4 C), resp. rate 18, height 6' (1.829 m), weight 116.6 kg, SpO2 94 %. Physical Exam  Constitutional: He appears well-developed and well-nourished. No distress.  obese  HENT:  Head: Normocephalic.  Eyes: Pupils are equal, round, and reactive to light. EOM are normal.  Neck: Normal range of motion. No tracheal deviation present. No thyromegaly present.  Cardiovascular: Normal rate and regular rhythm.  No murmur heard. Respiratory: Effort normal and breath sounds normal. No respiratory distress. He has no wheezes. He has no rales.  2l Oxygen via [place]  GI: Soft. Bowel sounds are normal. He exhibits no distension. There is no abdominal tenderness. There is no rebound.  Musculoskeletal: Normal range of motion.        General: No edema.  Neurological:  Pt oriented to person, place, month, year. Fair insight and awareness. Slow processing more complex info. Provided some biographical information. UE 3+ to 4/5 prox to distal. LE: 3/ 5 HF, KE and 4/5 ADF/PF. No focal sensory findings other than perhaps decreased PP in both feet.  DTR's 1+.     Skin:  Some chronic scars and vascular changes to limbs.   Psychiatric: He has a normal mood and affect. His behavior is normal. Thought content normal.    Results for orders placed or performed during the hospital encounter of 12/30/18 (from the past 48 hour(s))  Glucose, capillary     Status: Abnormal   Collection Time: 01/12/19 11:11 AM  Result Value Ref Range   Glucose-Capillary 182 (H)  70 - 99 mg/dL  Glucose, capillary     Status: Abnormal   Collection Time: 01/12/19  3:26 PM  Result Value Ref Range   Glucose-Capillary 182 (H) 70 - 99 mg/dL  Glucose, capillary     Status: Abnormal   Collection Time: 01/12/19  5:00 PM  Result Value Ref Range   Glucose-Capillary 298 (H) 70 - 99 mg/dL  Glucose, capillary     Status: Abnormal   Collection Time: 01/12/19  9:23 PM  Result Value Ref Range   Glucose-Capillary 199 (H) 70 - 99 mg/dL   Comment 1 Notify RN    Comment 2 Document in Chart   CBC     Status: Abnormal   Collection Time: 01/13/19  5:54 AM  Result Value Ref Range   WBC 13.5 (  H) 4.0 - 10.5 K/uL   RBC 2.96 (L) 4.22 - 5.81 MIL/uL   Hemoglobin 7.5 (L) 13.0 - 17.0 g/dL   HCT 26.3 (L) 39.0 - 52.0 %   MCV 88.9 80.0 - 100.0 fL   MCH 25.3 (L) 26.0 - 34.0 pg   MCHC 28.5 (L) 30.0 - 36.0 g/dL   RDW 19.6 (H) 11.5 - 15.5 %   Platelets 292 150 - 400 K/uL   nRBC 0.2 0.0 - 0.2 %    Comment: Performed at [facility], [place] [address] [place], [place] [address]  Basic metabolic panel     Status: Abnormal   Collection Time: 01/13/19  5:54 AM  Result Value Ref Range   Sodium 149 (H) 135 - 145 mmol/L   Potassium 3.3 (L) 3.5 - 5.1 mmol/L   Chloride 107 98 - 111 mmol/L   CO2 32 22 - 32 mmol/L   Glucose, Bld 219 (H) 70 - 99 mg/dL   BUN 55 (H) 8 - 23 mg/dL   Creatinine, Ser 1.45 (H) 0.61 - 1.24 mg/dL   Calcium 8.5 (L) 8.9 - 10.3 mg/dL   GFR calc non Af Amer 48 (L) >60 mL/min   GFR calc Af Amer 56 (L) >60 mL/min   Anion gap 10 5 - 15    Comment: Performed at [facility] [address], [place], [place] [address]  Glucose, capillary     Status: Abnormal   Collection Time: 01/13/19  6:45 AM  Result Value Ref Range   Glucose-Capillary 224 (H) 70 - 99 mg/dL   Comment 1 Notify RN    Comment 2 Document in Chart   Glucose, capillary     Status: Abnormal   Collection Time: 01/13/19 11:36 AM  Result Value Ref Range   Glucose-Capillary 239 (H) 70 - 99 mg/dL   Comment 1  Notify RN    Comment 2 Document in Chart   Glucose, capillary     Status: Abnormal   Collection Time: 01/13/19  4:31 PM  Result Value Ref Range   Glucose-Capillary 246 (H) 70 - 99 mg/dL   Comment 1 Notify RN    Comment 2 Document in Chart   Glucose, capillary     Status: Abnormal   Collection Time: 01/13/19  9:01 PM  Result Value Ref Range   Glucose-Capillary 271 (H) 70 - 99 mg/dL  Basic metabolic panel     Status: Abnormal   Collection Time: 01/14/19  5:06 AM  Result Value Ref Range   Sodium 147 (H) 135 - 145 mmol/L   Potassium 3.3 (L) 3.5 - 5.1 mmol/L   Chloride 105 98 - 111 mmol/L   CO2 30 22 - 32 mmol/L   Glucose, Bld 268 (H) 70 - 99 mg/dL   BUN 49 (H) 8 - 23 mg/dL   Creatinine, Ser 1.48 (H) 0.61 - 1.24 mg/dL   Calcium 8.3 (L) 8.9 - 10.3 mg/dL   GFR calc non Af Amer 47 (L) >60 mL/min   GFR calc Af Amer 55 (L) >60 mL/min   Anion gap 12 5 - 15    Comment: Performed at [facility] [address] [place], [place] [address]  Glucose, capillary     Status: Abnormal   Collection Time: 01/14/19  6:00 AM  Result Value Ref Range   Glucose-Capillary 259 (H) 70 - 99 mg/dL   No results found.     Medical Problem List and Plan: 1.  Decreased functional  mobility secondary to acute on chronic hypoxic respiratory failure complicated by toxic encephalopathy   -admit to inpatient rehab 2.  Antithrombotics: -DVT/anticoagulation:  Xarelto  -antiplatelet therapy: see above 3. Pain Management:  Neurontin 600 mg twice a day, oxycodone as needed 4. Mood:  Provide emotional support  -antipsychotic agents: None 5. Neuropsych: This patient is capable of making decisions on his own behalf. 6. Skin/Wound Care:  Routine skin checks 7. Fluids/Electrolytes/Nutrition:  Routine and out's with follow-up chemistries 8. PAF. Cardiac rate controlled. Follow cardiology services. Continue Xarelto 9. Hypertension. Zebeta 2.5 mg daily,Bidil 20-37.5 mg 3 times a day 10. Acute on chronicc systolic  congestive heart failure. Demadex 80 mg twice a day. Monitor for any signs of fluid overload 11. Tobacco abuse/COPD. Patient on 3 L oxygen prior to admission. Continue low-dose prednisone. Nebulizer treatments every 4 hours as needed 12. Diabetes mellitus peripheral neuropathy. Levemir 15 units daily. Check blood sugars before meals and at bedtime 13. Hyperlipidemia. Crestor    Lavon Paganini Angiulli, PA-C 01/14/2019

## 2019-01-15 ENCOUNTER — Inpatient Hospital Stay (HOSPITAL_COMMUNITY): Payer: Medicare Other

## 2019-01-15 ENCOUNTER — Inpatient Hospital Stay (HOSPITAL_COMMUNITY): Payer: Medicare Other | Admitting: Physical Therapy

## 2019-01-15 ENCOUNTER — Inpatient Hospital Stay (HOSPITAL_COMMUNITY): Payer: Medicare Other | Admitting: Occupational Therapy

## 2019-01-15 DIAGNOSIS — I5023 Acute on chronic systolic (congestive) heart failure: Secondary | ICD-10-CM

## 2019-01-15 DIAGNOSIS — E1165 Type 2 diabetes mellitus with hyperglycemia: Secondary | ICD-10-CM

## 2019-01-15 DIAGNOSIS — D649 Anemia, unspecified: Secondary | ICD-10-CM

## 2019-01-15 DIAGNOSIS — J9620 Acute and chronic respiratory failure, unspecified whether with hypoxia or hypercapnia: Secondary | ICD-10-CM

## 2019-01-15 LAB — BASIC METABOLIC PANEL
Anion gap: 11 (ref 5–15)
BUN: 34 mg/dL — ABNORMAL HIGH (ref 8–23)
CO2: 31 mmol/L (ref 22–32)
Calcium: 8.7 mg/dL — ABNORMAL LOW (ref 8.9–10.3)
Chloride: 100 mmol/L (ref 98–111)
Creatinine, Ser: 1.35 mg/dL — ABNORMAL HIGH (ref 0.61–1.24)
GFR calc Af Amer: >60 mL/min (ref >60)
GFR calc non Af Amer: 53 mL/min — ABNORMAL LOW (ref >60)
Glucose, Bld: 317 mg/dL — ABNORMAL HIGH (ref 70–99)
Potassium: 3.4 mmol/L — ABNORMAL LOW (ref 3.5–5.1)
Sodium: 142 mmol/L (ref 135–145)

## 2019-01-15 LAB — TYPE AND SCREEN
ABO/RH(D): B POS
ANTIBODY SCREEN: NEGATIVE
Unit division: 0

## 2019-01-15 LAB — GLUCOSE, CAPILLARY
GLUCOSE-CAPILLARY: 332 mg/dL — AB (ref 70–99)
Glucose-Capillary: 194 mg/dL — ABNORMAL HIGH (ref 70–99)
Glucose-Capillary: 281 mg/dL — ABNORMAL HIGH (ref 70–99)
Glucose-Capillary: 214 mg/dL — ABNORMAL HIGH (ref 70–99)

## 2019-01-15 LAB — BPAM RBC
Blood Product Expiration Date: 202004062359
ISSUE DATE / TIME: 202003131623
Unit Type and Rh: 7300

## 2019-01-15 MED ORDER — PREDNISONE 10 MG PO TABS
5.0000 mg | ORAL_TABLET | Freq: Every day | ORAL | Status: DC
  Administered 2019-01-16 – 2019-01-18 (×3): 5 mg via ORAL
  Filled 2019-01-15 (×3): qty 1

## 2019-01-15 MED ORDER — INSULIN ASPART 100 UNIT/ML ~~LOC~~ SOLN
0.0000 [IU] | Freq: Every day | SUBCUTANEOUS | Status: DC
  Administered 2019-01-15: 2 [IU] via SUBCUTANEOUS
  Administered 2019-01-16: 3 [IU] via SUBCUTANEOUS
  Administered 2019-01-17: 4 [IU] via SUBCUTANEOUS
  Administered 2019-01-18: 2 [IU] via SUBCUTANEOUS
  Administered 2019-01-19: 4 [IU] via SUBCUTANEOUS
  Administered 2019-01-21 – 2019-01-22 (×2): 2 [IU] via SUBCUTANEOUS

## 2019-01-15 MED ORDER — TORSEMIDE 20 MG PO TABS
80.0000 mg | ORAL_TABLET | Freq: Two times a day (BID) | ORAL | Status: DC
  Administered 2019-01-15 – 2019-01-24 (×18): 80 mg via ORAL
  Filled 2019-01-15 (×20): qty 4

## 2019-01-15 MED ORDER — INSULIN ASPART 100 UNIT/ML ~~LOC~~ SOLN
0.0000 [IU] | Freq: Three times a day (TID) | SUBCUTANEOUS | Status: DC
  Administered 2019-01-16: 11 [IU] via SUBCUTANEOUS
  Administered 2019-01-16 – 2019-01-17 (×3): 3 [IU] via SUBCUTANEOUS
  Administered 2019-01-17: 8 [IU] via SUBCUTANEOUS
  Administered 2019-01-18 – 2019-01-21 (×10): 5 [IU] via SUBCUTANEOUS
  Administered 2019-01-21: 3 [IU] via SUBCUTANEOUS
  Administered 2019-01-22: 2 [IU] via SUBCUTANEOUS
  Administered 2019-01-22: 8 [IU] via SUBCUTANEOUS
  Administered 2019-01-22: 3 [IU] via SUBCUTANEOUS
  Administered 2019-01-23: 8 [IU] via SUBCUTANEOUS
  Administered 2019-01-23 – 2019-01-24 (×3): 3 [IU] via SUBCUTANEOUS
  Administered 2019-01-24: 5 [IU] via SUBCUTANEOUS

## 2019-01-15 MED ORDER — POTASSIUM CHLORIDE CRYS ER 20 MEQ PO TBCR
20.0000 meq | EXTENDED_RELEASE_TABLET | Freq: Two times a day (BID) | ORAL | Status: DC
  Administered 2019-01-15 – 2019-01-16 (×4): 20 meq via ORAL
  Filled 2019-01-15 (×4): qty 1

## 2019-01-15 MED ORDER — INSULIN DETEMIR 100 UNIT/ML ~~LOC~~ SOLN
20.0000 [IU] | Freq: Every day | SUBCUTANEOUS | Status: DC
  Administered 2019-01-16 – 2019-01-24 (×9): 20 [IU] via SUBCUTANEOUS
  Filled 2019-01-15 (×9): qty 0.2

## 2019-01-15 NOTE — Progress Notes (Signed)
Patient ID: Nicholas Caldwell, male   DOB: 12-21-1945, 73 y.o.   MRN: [id number]  It looks like Mr Piatek was discharged to CIR without his torsemide.  I will order torsemide 80 mg bid (had planned to restart this yesterday afternoon, this is his home dose).  Will follow daily BMET.   Loralie Champagne 01/15/2019

## 2019-01-15 NOTE — Evaluation (Addendum)
Occupational Therapy Assessment and Plan  Patient Details  Name: Nicholas Caldwell MRN: [id number] Date of Birth: 08/10/1946  OT Diagnosis: abnormal posture, cognitive deficits and muscle weakness (generalized) Rehab Potential:   ELOS: 14-18  Today's Date: 01/15/2019 OT Individual Time: 6578-4696 OT Individual Time Calculation (min): 75 min     Problem List:  Patient Active Problem List   Diagnosis Date Noted  . Acute on chronic respiratory failure ([place]) 01/14/2019  . Hypoxia   . Altered mental status   . Heart failure ([place]) 12/30/2018  . AKI (acute kidney injury) ([place])   . Acute respiratory failure ([place]) 11/22/2018  . Acute on chronic respiratory failure with hypoxia and hypercapnia ([place]) 11/13/2018  . Metabolic encephalopathy 29/52/8413  . Acute respiratory failure with hypoxia and hypercapnia ([place]) 07/26/2018  . Acute exacerbation of CHF (congestive heart failure) ([place]) 07/26/2018  . CHF exacerbation ([place]) 05/01/2018  . CHF (congestive heart failure) ([place]) 04/30/2018  . Chronic respiratory failure with hypoxia ([place]) 12/28/2017  . Atrial fibrillation with RVR ([place])   . SVT (supraventricular tachycardia) ([place])   . COPD GOLD 0   . Medically noncompliant   . Panlobular emphysema ([place])   . OSA (obstructive sleep apnea)   . Pulmonary hypertension ([place])   . Disorientation   . Pressure injury of skin 09/07/2016  . Acute on chronic combined systolic and diastolic CHF (congestive heart failure) ([place]) 09/05/2016  . Skin lesion-left heal 09/05/2016  . Chronic systolic CHF (congestive heart failure) ([place]) 07/16/2016  . COPD (chronic obstructive pulmonary disease) ([place]) 07/16/2016  . Uncontrolled type 2 diabetes mellitus with complication ([place])   . Diabetic polyneuropathy associated with diabetes mellitus due to underlying condition ([place])   . Normocytic anemia 06/29/2016  . CAD - Non-obstructive by LHC1/16 01/21/2016  . Nonischemic cardiomyopathy ([place]) 10/09/2015  . PAF (paroxysmal  atrial fibrillation) ([place])   . Chronic pain 02/19/2015  . Morbid obesity ([place]) 02/13/2015  . Dyspnea   . Elevated troponin I level 11/01/2014  . COPD exacerbation ([place]) 11/01/2014  . Essential hypertension 10/31/2014  . Type 2 diabetes mellitus with neuropathy 10/31/2014  . Hyperlipidemia  10/31/2014  . Cigarette smoker 10/31/2014    Past Medical History:  Past Medical History:  Diagnosis Date  . Atrial flutter ([place])    a. recurrent AFlutter with RVR;  b. Amiodarone Rx started 4/16  . CAD (coronary artery disease)    a. LHC 1/16:  mLAD diffuse disease, pLCx mild disease, dLCx with disease but too small for PCI, RCA ok, EF 25-30%  . Chronic pain   . Chronic systolic CHF (congestive heart failure) ([place])   . COPD (chronic obstructive pulmonary disease) ([place])   . Diabetes mellitus without complication ([place])   . Hypercholesteremia   . Hypertension   . NICM (nonischemic cardiomyopathy) ([place])    a.dx 2016. b. 2D echo 06/2016 - Last echo 07/01/16: mod dilated LV, mod LVH, EF 25-30%, mild-mod MR, sev LAE, mild-mod reduced RV systolic function, mild-mod TR, PASP 40mHG.  .Marland KitchenPAF (paroxysmal atrial fibrillation) (H[facility])    On amio - ot a candidate for flecainide due to cardiomyopathy, not a candidate for Tikosyn due to prolonged QT, and felt to be a poor candidate for ablation given left atrial size.  . Pulmonary hypertension ([place]   . Tobacco abuse    Past Surgical History:  Past Surgical History:  Procedure Laterality Date  . CARDIOVERSION N/A 07/30/2018   Procedure: CARDIOVERSION;  Surgeon: MLarey Dresser MD;  [facility]  Service:  Cardiovascular;  Laterality: N/A;  . LEFT AND RIGHT HEART CATHETERIZATION WITH CORONARY ANGIOGRAM N/A 11/06/2014   Procedure: LEFT AND RIGHT HEART CATHETERIZATION WITH CORONARY ANGIOGRAM;  Surgeon: Jettie Booze, MD;  Location: [facility] CATH LAB;  Service: Cardiovascular;  Laterality: N/A;  . RIGHT/LEFT HEART CATH AND CORONARY ANGIOGRAPHY N/A 12/06/2018    Procedure: RIGHT/LEFT HEART CATH AND CORONARY ANGIOGRAPHY;  Surgeon: Larey Dresser, MD;  Location: [facility] CV LAB;  Service: Cardiovascular;  Laterality: N/A;  . SPLENECTOMY      Assessment & Plan Clinical Impression:   Nicholas Caldwell is a 73 year old right-handed male history of atrial flutter/PAF and continues on Xarelto, CAD,chronic back pain maintained on Neurontin as well as oxycodone as needed,chronic anemia, chronic diastolic congestive heart failure, diabetes mellitus maintained on Glucophage 1000 mg twice a day and NovoLog 70/30, hypertension maintained on Bidil, COPD/tobacco abuse and used oxygen at home. Per chart review patient lives with spouse. One level home with one step to entry. Used a rolling walker for mobility. Wife did assist with some lower body ADLs. Presented 12/30/2018 with increasing shortness of breath and approximately 12 pounds of weight gain over the last 4 days. Increase edema lower extremities. Chest x-ray showed no interval change and congestive heart failure with edema and bilateral effusions. CT the chest showed small right pleural effusion as well as several small pulmonary nodules largest pulmonary nodule measuring 8 mm in the left upper lobe noncontrast CT of the chest recommended 3-6 months. Ultrasound the abdomen negative for significant ascites. Urine drug screen was positive for opiates and benzos. Acute on chronic anemia hemoglobin 8.7. EKG atrial fibrillationwith history of PAF maintained on Xarelto. Follow-up cardiology services. Patient did require DCCV 01/10/2019 and remained on intravenous heparin for a short time. Noted with ongoing acute respiratory failure related to CHF requiring intubation 01/04/2019 through 01/11/2019. Diet has been advanced to regular consistency.therapy evaluations completed with recommendations of physical medicine rehabilitation consult  Patient currently requires mod-max with basic self-care skills secondary to muscle  weakness, decreased cardiorespiratoy endurance and decreased oxygen support, decreased coordination, decreased attention, decreased awareness, decreased problem solving, decreased safety awareness and decreased memory and decreased sitting balance, decreased standing balance, decreased postural control and decreased balance strategies.  Prior to hospitalization, patient could complete BADL with min.  Patient will benefit from skilled intervention to decrease level of assist with basic self-care skills and increase independence with basic self-care skills prior to discharge home with care partner.  Anticipate patient will require 24 hour supervision and follow up home health.  OT - End of Session Activity Tolerance: Tolerates 10 - 20 min activity with multiple rests Endurance Deficit: Yes OT Assessment Rehab Potential (ACUTE ONLY): Good OT Barriers to Discharge: Inaccessible home environment OT Patient demonstrates impairments in the following area(s): Balance;Cognition;Edema;Endurance;Motor;Pain;Safety;Sensory OT Basic ADL's Functional Problem(s): Grooming;Bathing;Dressing;Toileting OT Transfers Functional Problem(s): Toilet;Tub/Shower OT Additional Impairment(s): None OT Plan OT Intensity: Minimum of 1-2 x/day, 45 to 90 minutes OT Frequency: 5 out of 7 days OT Duration/Estimated Length of Stay: 10-12 OT Treatment/Interventions: Balance/vestibular training;Disease mangement/prevention;Neuromuscular re-education;Self Care/advanced ADL retraining;Therapeutic Exercise;Wheelchair propulsion/positioning;Cognitive remediation/compensation;DME/adaptive equipment instruction;Pain management;Skin care/wound managment;UE/LE Strength taining/ROM;Community reintegration;Functional electrical stimulation;Patient/family education;Splinting/orthotics;UE/LE Coordination activities;Discharge planning;Functional mobility training;Psychosocial support;Therapeutic Activities;Visual/perceptual  remediation/compensation OT Self Feeding Anticipated Outcome(s): S OT Basic Self-Care Anticipated Outcome(s): S OT Toileting Anticipated Outcome(s): S OT Bathroom Transfers Anticipated Outcome(s): S OT Recommendation Patient destination: Home Follow Up Recommendations: Home health OT Equipment Recommended: Tub/shower bench;To be determined   Skilled Therapeutic Intervention 1:1. Pt and wife  educated on role/purpose of OT, CIR, ELOS and POC. OT selects w/c with cushion for apporpriate width. Pt completes supine>sit with CGA for trunk elevation and VC for sequencing use of bed rails. Pt completes stand pivot transfer with MOD HHA  EOB<>w/c. Pt completes UB bathing and dressing with supervision. Pt declines oral care. Pt sit to stand at sink with MOD A to wash peri area, buttocks and advance pants past hip. Wife provided A with LB ADLs PTA and OT provides same A for footwear and pants over feet. Pt unable to fully advance pants past hips. Pt left in w/c with belt alarm on, call light in reach and set up with breakfast. O2 >95% throughout session.  OT Evaluation Precautions/Restrictions  Precautions Precautions: Fall Precaution Comments: 3-4L O2 [place] baseline Restrictions Weight Bearing Restrictions: No General Chart Reviewed: Yes Family/Caregiver Present: Yes Vital Signs Therapy Vitals Temp: 98.1 F (36.7 C) Pulse Rate: 71 Resp: 16 BP: 117/61 Patient Position (if appropriate): Lying Oxygen Therapy SpO2: 100 % O2 Device: Nasal Cannula O2 Flow Rate (L/min): 2 L/min Pain Pain Assessment Pain Scale: 0-10 Pain Score: 6  Pain Type: Chronic pain Pain Location: Hip Pain Orientation: Right Pain Descriptors / Indicators: Aching;Dull Pain Onset: On-going Pain Intervention(s): Medication (See eMAR) Home Living/Prior Functioning Home Living Available Help at Discharge: Family, Available 24 hours/day Type of Home: House Home Access: Stairs to enter Technical brewer of Steps:  1 Entrance Stairs-Rails: None Home Layout: One level Bathroom Shower/Tub: Tub/shower unit Additional Comments: on 3L of O2 at all times at home  Lives With: Spouse Prior Function Level of Independence: Independent with basic ADLs, Needs assistance with ADLs ADL   Vision Baseline Vision/History: Wears glasses Wears Glasses: Reading only Patient Visual Report: No change from baseline Vision Assessment?: No apparent visual deficits Perception  Perception: Within Functional Limits Praxis Praxis: Intact Cognition Orientation Level: Person;Place;Situation Person: Oriented Place: Oriented Situation: Oriented Year: 2020 Month: March Day of Week: Correct Memory: Appears intact Immediate Memory Recall: Sock;Blue;Bed Memory Recall: Blue;Bed Memory Recall Blue: Without Cue Memory Recall Bed: Without Cue Attention: Sustained Sustained Attention: Appears intact Awareness: Impaired Awareness Impairment: Intellectual impairment Safety/Judgment: Impaired Sensation Sensation Light Touch: Impaired by gross assessment(B toes no R/L at stimulus) Coordination Gross Motor Movements are Fluid and Coordinated: Yes Fine Motor Movements are Fluid and Coordinated: No Finger Nose Finger Test: slow Motor  Motor Motor: Within Functional Limits Mobility  Transfers Sit to Stand: Moderate Assistance - Patient 50-74% Stand to Sit: Moderate Assistance - Patient 50-74%  Trunk/Postural Assessment  Cervical Assessment Cervical Assessment: Exceptions to WFL(head forward) Thoracic Assessment Thoracic Assessment: Exceptions to WFL(rounded shoudlers) Lumbar Assessment Lumbar Assessment: Exceptions to WFL(posterior pelvic tilt) Postural Control Postural Control: Deficits on evaluation(insufficient)  Balance Balance Balance Assessed: Yes Dynamic Sitting Balance Sitting balance - Comments: S-min A Dynamic Standing Balance Dynamic Standing - Level of Assistance: 3: Mod assist Dynamic Standing -  Comments: dressing Extremity/Trunk Assessment RUE Assessment RUE Assessment: Exceptions to [facility] General Strength Comments: generalized weakness LUE Assessment LUE Assessment: Exceptions to [facility] General Strength Comments: generalized weakness     Refer to Care Plan for Long Term Goals  Recommendations for other services: None    Discharge Criteria: Patient will be discharged from OT if patient refuses treatment 3 consecutive times without medical reason, if treatment goals not met, if there is a change in medical status, if patient makes no progress towards goals or if patient is discharged from hospital.  The above assessment, treatment plan, treatment alternatives and  goals were discussed and mutually agreed upon: by patient and by family  Tonny Branch 01/15/2019, 7:23 AM

## 2019-01-15 NOTE — Progress Notes (Signed)
[facility] PHYSICAL MEDICINE & REHABILITATION PROGRESS NOTE   Subjective/Complaints: Pt states he had a fair night. Denies any new issues. Tolerated blood without a problem  ROS: Patient denies fever, rash, sore throat, blurred vision, nausea, vomiting, diarrhea, cough, shortness of breath or chest pain, joint or back pain, headache, or mood change.    Objective:   No results found. Recent Labs    01/13/19 0554 01/14/19 1201  WBC 13.5*  --   HGB 7.5* 7.3*  HCT 26.3* 25.6*  PLT 292  --    Recent Labs    01/13/19 0554 01/14/19 0506  NA 149* 147*  K 3.3* 3.3*  CL 107 105  CO2 32 30  GLUCOSE 219* 268*  BUN 55* 49*  CREATININE 1.45* 1.48*  CALCIUM 8.5* 8.3*    Intake/Output Summary (Last 24 hours) at 01/15/2019 0924 Last data filed at 01/15/2019 0700 Gross per 24 hour  Intake -  Output 3075 ml  Net -3075 ml     Physical Exam: Vital Signs Blood pressure 117/61, pulse 71, temperature 98.1 F (36.7 C), resp. rate 16, SpO2 100 %. Constitutional: No distress . Vital signs reviewed. obese HEENT: EOMI, oral membranes moist Neck: supple Cardiovascular: RRR without murmur. No JVD    Respiratory: CTA Bilaterally without wheezes or rales. Normal effort    GI: BS +, non-tender, non-distended  Musculoskeletal:Normal range of motion.  General: No edema.  Neurological: Alert, fair insight and awareness.  UE 3+ to 4/5 prox to distal. LE: 3/ 5 HF, KE and 4/5 ADF/PF. No focal sensory findings other than perhaps decreased PP in both feet. DTR's 1+. Psych: pleasant    Assessment/Plan: 1. Functional deficits secondary to debility/encephalopathy which require 3+ hours per day of interdisciplinary therapy in a comprehensive inpatient rehab setting.  Physiatrist is providing close team supervision and 24 hour management of active medical problems listed below.  Physiatrist and rehab team continue to assess barriers to discharge/monitor patient progress toward functional  and medical goals  Care Tool:  Bathing              Bathing assist       Upper Body Dressing/Undressing Upper body dressing        Upper body assist      Lower Body Dressing/Undressing Lower body dressing            Lower body assist       Toileting Toileting    Toileting assist       Transfers Chair/bed transfer  Transfers assist           Locomotion Ambulation   Ambulation assist              Walk 10 feet activity   Assist           Walk 50 feet activity   Assist           Walk 150 feet activity   Assist           Walk 10 feet on uneven surface  activity   Assist           Wheelchair     Assist               Wheelchair 50 feet with 2 turns activity    Assist            Wheelchair 150 feet activity     Assist          Medical Problem List and Plan:  1.Decreased functional mobilitysecondary to acute on chronic hypoxic respiratory failure complicatedby toxicencephalopathy  -beginning therapies today 2. Antithrombotics: -DVT/anticoagulation:Xarelto -antiplatelet therapy: see above 3. Pain Management:Neurontin 600 mg twice a day, oxycodone as needed 4. Mood:Provide emotional support -antipsychotic agents: None 5. Neuropsych: This patientiscapable of making decisions on hisown behalf. 6. Skin/Wound Care:Routine skin checks 7. Fluids/Electrolytes/Nutrition:Routine and out's with follow-up chemistries  -hypokalemia: kdur added 75mq bid 8. PAF. Cardiac rate controlled. Follow cardiology services. Continue Xarelto 9. Hypertension. Zebeta 2.5 mg daily,Bidil 20-37.5 mg 3 times a day 10.Acute on chronicc systolic congestive heart failure.   -Demadex 80 mg twice a day---resumed today by cardiology  -Monitor for any signs of fluid overload  -need daily weights 11. Tobacco abuse/COPD. Patient on 3 L oxygen prior to admission.  Continue low-dose prednisone. Nebulizer treatments every 4 hours as needed 12. Diabetes mellitus peripheral neuropathy.   -poorly controlled at present  -Levemir 15 units daily--increase to 20u tomorrow  -now metformin due to Cr   -Check blood sugars before meals and at bedtime 13. Hyperlipidemia. Crestor 14. Acute on chronic anemia  -s/p 1u PRBC  -recheck hgb tomorrow    LOS: 1 days A FACE TO FACE EVALUATION WAS PERFORMED  ZMeredith Staggers3/14/2020, 9:24 AM

## 2019-01-15 NOTE — Evaluation (Signed)
Physical Therapy Assessment and Plan  Patient Details  Name: Nicholas Caldwell MRN: [id number] Date of Birth: 1946/05/12  PT Diagnosis: Abnormality of gait, Coordination disorder, Difficulty walking, Impaired sensation and Muscle weakness Rehab Potential: Good ELOS: 2-3 weeks    Today's Date: 01/15/2019 PT Individual Time: 1300-1403 PT Individual Time Calculation (min): 63 min    Problem List:  Patient Active Problem List   Diagnosis Date Noted  . Acute on chronic respiratory failure ([place]) 01/14/2019  . Hypoxia   . Altered mental status   . Heart failure ([place]) 12/30/2018  . AKI (acute kidney injury) ([place])   . Acute respiratory failure ([place]) 11/22/2018  . Acute on chronic respiratory failure with hypoxia and hypercapnia ([place]) 11/13/2018  . Metabolic encephalopathy 82/64/1583  . Acute respiratory failure with hypoxia and hypercapnia ([place]) 07/26/2018  . Acute exacerbation of CHF (congestive heart failure) ([place]) 07/26/2018  . CHF exacerbation ([place]) 05/01/2018  . CHF (congestive heart failure) ([place]) 04/30/2018  . Chronic respiratory failure with hypoxia ([place]) 12/28/2017  . Atrial fibrillation with RVR ([place])   . SVT (supraventricular tachycardia) ([place])   . COPD GOLD 0   . Medically noncompliant   . Panlobular emphysema ([place])   . OSA (obstructive sleep apnea)   . Pulmonary hypertension ()   . Disorientation   . Pressure injury of skin 09/07/2016  . Acute on chronic combined systolic and diastolic CHF (congestive heart failure) ([place]) 09/05/2016  . Skin lesion-left heal 09/05/2016  . Chronic systolic CHF (congestive heart failure) ([place]) 07/16/2016  . COPD (chronic obstructive pulmonary disease) ([place]) 07/16/2016  . Uncontrolled type 2 diabetes mellitus with complication ([place])   . Diabetic polyneuropathy associated with diabetes mellitus due to underlying condition ([place])   . Normocytic anemia 06/29/2016  . CAD - Non-obstructive by LHC1/16 01/21/2016  . Nonischemic cardiomyopathy ([place])  10/09/2015  . PAF (paroxysmal atrial fibrillation) ([place])   . Chronic pain 02/19/2015  . Morbid obesity ([place]) 02/13/2015  . Dyspnea   . Elevated troponin I level 11/01/2014  . COPD exacerbation ([place]) 11/01/2014  . Essential hypertension 10/31/2014  . Type 2 diabetes mellitus with neuropathy 10/31/2014  . Hyperlipidemia  10/31/2014  . Cigarette smoker 10/31/2014    Past Medical History:  Past Medical History:  Diagnosis Date  . Atrial flutter ([place])    a. recurrent AFlutter with RVR;  b. Amiodarone Rx started 4/16  . CAD (coronary artery disease)    a. LHC 1/16:  mLAD diffuse disease, pLCx mild disease, dLCx with disease but too small for PCI, RCA ok, EF 25-30%  . Chronic pain   . Chronic systolic CHF (congestive heart failure) ([place])   . COPD (chronic obstructive pulmonary disease) ([place])   . Diabetes mellitus without complication ([employer])   . Hypercholesteremia   . Hypertension   . NICM (nonischemic cardiomyopathy) ([place])    a.dx 2016. b. 2D echo 06/2016 - Last echo 07/01/16: mod dilated LV, mod LVH, EF 25-30%, mild-mod MR, sev LAE, mild-mod reduced RV systolic function, mild-mod TR, PASP 21mHG.  .Marland KitchenPAF (paroxysmal atrial fibrillation) ([facility])    On amio - ot a candidate for flecainide due to cardiomyopathy, not a candidate for Tikosyn due to prolonged QT, and felt to be a poor candidate for ablation given left atrial size.  . Pulmonary hypertension ([place]   . Tobacco abuse    Past Surgical History:  Past Surgical History:  Procedure Laterality Date  . CARDIOVERSION N/A 07/30/2018   Procedure: CARDIOVERSION;  Surgeon: MLarey Dresser MD;  Location: [facility] END[facility]COPY;  Service: Cardiovascular;  Laterality: N/A;  . LEFT AND RIGHT HEART CATHETERIZATION WITH CORONARY ANGIOGRAM N/A 11/06/2014   Procedure: LEFT AND RIGHT HEART CATHETERIZATION WITH CORONARY ANGIOGRAM;  Surgeon: Jettie Booze, MD;  Location: [facility] CATH LAB;  Service: Cardiovascular;  Laterality: N/A;  . RIGHT/LEFT HEART CATH AND  CORONARY ANGIOGRAPHY N/A 12/06/2018   Procedure: RIGHT/LEFT HEART CATH AND CORONARY ANGIOGRAPHY;  Surgeon: Larey Dresser, MD;  Location: [facility] CV LAB;  Service: Cardiovascular;  Laterality: N/A;  . SPLENECTOMY      Assessment & Plan Clinical Impression: Nicholas Caldwell is a 73 year old right-handed male history of atrial flutter/PAF and continues on Xarelto, CAD,chronic back pain maintained on Neurontin as well as oxycodone as needed,chronic anemia, chronic diastolic congestive heart failure, diabetes mellitus maintained on Glucophage 1000 mg twice a day and NovoLog 70/30, hypertension maintained on Bidil, COPD/tobacco abuse and used oxygen at home. Per chart review patient lives with spouse. One level home with one step to entry. Used a rolling walker for mobility. Wife did assist with some lower body ADLs. Presented 12/30/2018 with increasing shortness of breath and approximately 12 pounds of weight gain over the last 4 days. Increase edema lower extremities. Chest x-ray showed no interval change and congestive heart failure with edema and bilateral effusions. CT the chest showed small right pleural effusion as well as several small pulmonary nodules largest pulmonary nodule measuring 8 mm in the left upper lobe noncontrast CT of the chest recommended 3-6 months. Ultrasound the abdomen negative for significant ascites. Urine drug screen was positive for opiates and benzos. Acute on chronic anemia hemoglobin 8.7. EKG atrial fibrillationwith history of PAF maintained on Xarelto. Follow-up cardiology services. Patient did require DCCV 01/10/2019 and remained on intravenous heparin for a short time. Noted with ongoing acute respiratory failure related to CHF requiring intubation 01/04/2019 through 01/11/2019. Diet has been advanced to regular consistency.therapy evaluations completed with recommendations of physical medicine rehabilitation consult. Patient was admitted for a comprehensive rehabilitation  program.   Patient transferred to CIR on 01/14/2019 .   Patient currently requires max with mobility secondary to muscle weakness, decreased cardiorespiratoy endurance, unbalanced muscle activation and decreased coordination and decreased sitting balance, decreased standing balance and decreased balance strategies.  Prior to hospitalization, patient was S-MinA based off of information given from patient, may require further clarification from family however with mobility and lived with Spouse in a House home.  Home access is 1Stairs to enter.  Patient will benefit from skilled PT intervention to maximize safe functional mobility, minimize fall risk and decrease caregiver burden for planned discharge home with 24 hour assist.  Anticipate patient will benefit from follow up [facility] at discharge.  PT - End of Session Activity Tolerance: Tolerates 30+ min activity with multiple rests Endurance Deficit: Yes PT Assessment Rehab Potential (ACUTE/IP ONLY): Good PT Patient demonstrates impairments in the following area(s): Balance;Safety;Endurance;Motor PT Transfers Functional Problem(s): Bed Mobility;Bed to Chair;Car;Furniture;Floor PT Locomotion Functional Problem(s): Ambulation;Wheelchair Mobility;Stairs PT Plan PT Intensity: Minimum of 1-2 x/day ,45 to 90 minutes PT Frequency: 5 out of 7 days PT Duration Estimated Length of Stay: 2-3 weeks  PT Treatment/Interventions: Ambulation/gait training;Community reintegration;DME/adaptive equipment instruction;Neuromuscular re-education;Psychosocial support;Stair training;UE/LE Strength taining/ROM;Wheelchair propulsion/positioning;Balance/vestibular training;Discharge planning;Functional electrical stimulation;Pain management;Skin care/wound management;Therapeutic Activities;UE/LE Coordination activities;Cognitive remediation/compensation;Disease management/prevention;Functional mobility training;Patient/family education;Splinting/orthotics;Therapeutic  Exercise;Visual/perceptual remediation/compensation PT Transfers Anticipated Outcome(s): S, LRAD  PT Locomotion Anticipated Outcome(s): S, LRAD  PT Recommendation Recommendations for Other Services: Speech consult Follow Up Recommendations: Home health PT Patient destination:  Home Equipment Recommended: Rolling walker with 5" wheels;Tub/shower seat;3 in 1 bedside comode  Skilled Therapeutic Intervention  PT evaluation and POC initiated. Patient initially very lethargic and sleepy, but able to be roused for PT with multimodal cues today. Generally requires ModA for all functional bed mobility, and Max for sit to stand, however once on his feet required fluctuating levels of assist ranging from min guard to [place] for RW management and safety. Able to self-propel WC approximately 55f with MInA for straight line navigation. [facility] and head rest for now due to concern of patient tipping regular chair backwards with heavy posterior lean in chair/pelvic tilt, will update and adjust as appropriate. He remains a good candidate for skilled PT services in the inpatient rehab setting. [facility], family present, seat belt alarm active, and RN present.   PT Evaluation Precautions/Restrictions Precautions Precautions: Fall Precaution Comments: 3-4L O2 [place] baseline Restrictions Weight Bearing Restrictions: No General   Vital SignsTherapy Vitals Pulse Rate: 64 Resp: 20 BP: 103/63 Patient Position (if appropriate): Lying Oxygen Therapy SpO2: 100 % O2 Device: Nasal Cannula Pain Pain Assessment Pain Scale: Faces Pain Score: 8  Faces Pain Scale: No hurt Pain Location: Hip Pain Orientation: Right;Left Pain Descriptors / Indicators: Aching Pain Frequency: Intermittent Pain Onset: On-going Patients Stated Pain Goal: 2 Pain Intervention(s): Medication (See eMAR);Repositioned Multiple Pain Sites: No Home Living/Prior Functioning Home  Living Available Help at Discharge: Family;Available 24 hours/day Type of Home: House Home Access: Stairs to enter ECenterPoint Energyof Steps: 1 Entrance Stairs-Rails: None Home Layout: One level Bathroom Shower/Tub: TChiropodist Standard Bathroom Accessibility: Yes Additional Comments: on 3L of O2 at all times at home  Lives With: Spouse Prior Function Level of Independence: Independent with basic ADLs;Needs assistance with ADLs;Needs assistance with tranfers;Needs assistance with gait  Able to Take Stairs?: Yes Driving: No Comments: denies falls between admssions. was working with HHPT Vision/Perception  Perception Perception: Within Functional Limits Praxis Praxis: Intact  Cognition Overall Cognitive Status: Within Functional Limits for tasks assessed Arousal/Alertness: Awake/alert Orientation Level: Oriented X4 Attention: Sustained Sustained Attention: Appears intact Memory: Appears intact Awareness: Impaired Awareness Impairment: Intellectual impairment Safety/Judgment: Impaired Sensation Sensation Light Touch: Impaired by gross assessment Hot/Cold: Appears Intact Proprioception: Appears Intact Stereognosis: Appears Intact Coordination Gross Motor Movements are Fluid and Coordinated: Yes Fine Motor Movements are Fluid and Coordinated: No Heel Shin Test: limited by stiffness/weakness  Motor  Motor Motor: Within Functional Limits  Mobility Bed Mobility Bed Mobility: Rolling Right;Rolling Left;Supine to Sit;Sit to Supine Rolling Right: Moderate Assistance - Patient 50-74% Rolling Left: Moderate Assistance - Patient 50-74% Supine to Sit: Moderate Assistance - Patient 50-74% Sit to Supine: (refused ) Transfers Transfers: Sit to Stand;Stand to Sit;Stand Pivot Transfers Sit to Stand: Maximal Assistance - Patient 25-49% Stand to Sit: Maximal Assistance - Patient 25-49% Stand Pivot Transfers: Moderate Assistance - Patient 50 - 74% Stand  Pivot Transfer Details: Verbal cues for precautions/safety;Tactile cues for placement;Tactile cues for weight shifting;Tactile cues for sequencing;Verbal cues for technique;Verbal cues for gait pattern;Verbal cues for safe use of DME/AE Transfer (Assistive device): Rolling walker Locomotion  Gait Ambulation: Yes Gait Assistance: Contact Guard/Touching assist Gait Distance (Feet): 10 Feet Assistive device: Rolling walker Gait Assistance Details: Verbal cues for technique;Verbal cues for gait pattern;Verbal cues for precautions/safety;Verbal cues for safe use of DME/AE;Verbal cues for sequencing Gait Gait: Yes Gait Pattern: Decreased step length - right;Decreased step length -  left;Step-through pattern;Decreased stride length;Decreased dorsiflexion - left;Decreased dorsiflexion - right;Decreased trunk rotation;Trunk flexed;Poor foot clearance - left;Poor foot clearance - right Gait velocity: decreased Stairs / Additional Locomotion Stairs: No Architect: Yes Wheelchair Assistance: Minimal assistance - Patient >75% Wheelchair Propulsion: Both upper extremities Wheelchair Parts Management: Needs assistance Distance: 69f   Trunk/Postural Assessment  Cervical Assessment Cervical Assessment: Exceptions to WFL(forward head ) Thoracic Assessment Thoracic Assessment: Exceptions to WFL(rounded shoulders ) Lumbar Assessment Lumbar Assessment: Exceptions to WFL(posterior pelvic tilt ) Postural Control Postural Control: Deficits on evaluation  Balance Balance Balance Assessed: Yes Static Sitting Balance Static Sitting - Balance Support: Feet supported;Right upper extremity supported;Left upper extremity supported Static Sitting - Level of Assistance: 5: Stand by assistance Dynamic Sitting Balance Dynamic Sitting - Balance Support: Right upper extremity supported;Left upper extremity supported;Feet supported Dynamic Sitting - Level of Assistance: 4: Min  aInsurance risk surveyorStanding - Balance Support: During functional activity Static Standing - Level of Assistance: 4: Min assist Dynamic Standing Balance Dynamic Standing - Balance Support: During functional activity;Right upper extremity supported;Left upper extremity supported Dynamic Standing - Level of Assistance: 4: Min assist;3: Mod assist Dynamic Standing - Comments: min-modA depending on task  Extremity Assessment  RUE Assessment RUE Assessment: Not tested LUE Assessment LUE Assessment: Not tested RLE Assessment General Strength Comments: generalized weakness  LLE Assessment General Strength Comments: generalized weakness     Refer to Care Plan for Long Term Goals  Recommendations for other services: None   Discharge Criteria: Patient will be discharged from PT if patient refuses treatment 3 consecutive times without medical reason, if treatment goals not met, if there is a change in medical status, if patient makes no progress towards goals or if patient is discharged from hospital.  The above assessment, treatment plan, treatment alternatives and goals were discussed and mutually agreed upon: by patient  KDeniece ReePT, DPT, CBIS  Supplemental Physical Therapist [facility]   Pager [phone] Rehab Office [phone]

## 2019-01-15 NOTE — Progress Notes (Signed)
Occupational Therapy Session Note  Patient Details  Name: Nicholas Caldwell MRN: [id number] Date of Birth: Jun 27, 1946  Today's Date: 01/15/2019 OT Individual Time: 0100-7121 OT Individual Time Calculation (min): 40 min   Short Term Goals: Week 1:  OT Short Term Goal 1 (Week 1): Pt will transfer to BSC/toilet with MIN A and LRAD OT Short Term Goal 2 (Week 1): Pt will thread BLE into pants wiht AE PRN and mod VC OT Short Term Goal 3 (Week 1): Pt will don socks with AE PRN and min VC for technique OT Short Term Goal 4 (Week 1): Pt will advance pants past hips in standing wiht MIN A  Skilled Therapeutic Interventions/Progress Updates:    Pt greeted in w/c with no c/o pain. Spouse present. Agreeable to AE training for increasing functional independence with LB dressing. Pt provided with hands on education regarding reacher use to thread LEs into pants and doff socks, and also when using sock aide to don gripper socks. OT also demonstrated use of shoe horn for when spouse brings in his sneakers. Also discussed LH sponge use when one is in stock. 02 sats throughout session 100% on 3L. Pt was then left in w/c with all needs within reach, safety belt fastened, and spouse present.   Therapy Documentation Precautions:  Precautions Precautions: Fall Precaution Comments: 3-4L O2 [place] baseline Restrictions Weight Bearing Restrictions: No Pain: Pt premedicated at start of session  Pain Assessment Pain Scale: Faces Pain Score: 8  Faces Pain Scale: No hurt Pain Location: Hip Pain Orientation: Right;Left Pain Descriptors / Indicators: Aching Pain Frequency: Intermittent Pain Onset: On-going Patients Stated Pain Goal: 2 Pain Intervention(s): Medication (See eMAR);Repositioned Multiple Pain Sites: No ADL:   Vision Baseline Vision/History: Wears glasses Wears Glasses: Reading only Patient Visual Report: No change from baseline Vision Assessment?: No apparent visual deficits Perception  Perception:  Within Functional Limits Praxis Praxis: Intact :     Therapy/Group: Individual Therapy  Taneil Lazarus A Nylene Inlow 01/15/2019, 5:41 PM

## 2019-01-16 DIAGNOSIS — G931 Anoxic brain damage, not elsewhere classified: Secondary | ICD-10-CM

## 2019-01-16 LAB — BASIC METABOLIC PANEL
Anion gap: 11 (ref 5–15)
BUN: 30 mg/dL — AB (ref 8–23)
CO2: 32 mmol/L (ref 22–32)
Calcium: 8 mg/dL — ABNORMAL LOW (ref 8.9–10.3)
Chloride: 96 mmol/L — ABNORMAL LOW (ref 98–111)
Creatinine, Ser: 1.2 mg/dL (ref 0.61–1.24)
GFR calc Af Amer: >60 mL/min (ref >60)
GFR calc non Af Amer: >60 mL/min (ref >60)
Glucose, Bld: 184 mg/dL — ABNORMAL HIGH (ref 70–99)
Potassium: 3.2 mmol/L — ABNORMAL LOW (ref 3.5–5.1)
Sodium: 139 mmol/L (ref 135–145)

## 2019-01-16 LAB — CBC WITH DIFFERENTIAL/PLATELET
Abs Immature Granulocytes: 0.1 10*3/uL — ABNORMAL HIGH (ref 0.00–0.07)
Basophils Absolute: 0 10*3/uL (ref 0.0–0.1)
Basophils Relative: 0 %
Eosinophils Absolute: 0.4 10*3/uL (ref 0.0–0.5)
Eosinophils Relative: 3 %
HEMATOCRIT: 29 % — AB (ref 39.0–52.0)
Hemoglobin: 8.4 g/dL — ABNORMAL LOW (ref 13.0–17.0)
Immature Granulocytes: 1 %
Lymphocytes Relative: 12 %
Lymphs Abs: 1.6 10*3/uL (ref 0.7–4.0)
MCH: 25.8 pg — ABNORMAL LOW (ref 26.0–34.0)
MCHC: 29 g/dL — ABNORMAL LOW (ref 30.0–36.0)
MCV: 89.2 fL (ref 80.0–100.0)
MONO ABS: 1.1 10*3/uL — AB (ref 0.1–1.0)
Monocytes Relative: 8 %
Neutro Abs: 10.6 10*3/uL — ABNORMAL HIGH (ref 1.7–7.7)
Neutrophils Relative %: 76 %
Platelets: 345 10*3/uL (ref 150–400)
RBC: 3.25 MIL/uL — ABNORMAL LOW (ref 4.22–5.81)
RDW: 19.1 % — ABNORMAL HIGH (ref 11.5–15.5)
WBC: 13.7 10*3/uL — ABNORMAL HIGH (ref 4.0–10.5)
nRBC: 0.8 % — ABNORMAL HIGH (ref 0.0–0.2)

## 2019-01-16 LAB — GLUCOSE, CAPILLARY
Glucose-Capillary: 186 mg/dL — ABNORMAL HIGH (ref 70–99)
Glucose-Capillary: 195 mg/dL — ABNORMAL HIGH (ref 70–99)
Glucose-Capillary: 345 mg/dL — ABNORMAL HIGH (ref 70–99)
Glucose-Capillary: 261 mg/dL — ABNORMAL HIGH (ref 70–99)

## 2019-01-16 NOTE — Progress Notes (Signed)
[facility] PHYSICAL MEDICINE & REHABILITATION PROGRESS NOTE   Subjective/Complaints: Had a good night. Denies pain. Slept well.   ROS: Patient denies fever, rash, sore throat, blurred vision, nausea, vomiting, diarrhea, cough, shortness of breath or chest pain, joint or back pain, headache, or mood change. .    Objective:   No results found. Recent Labs    01/14/19 1201  HGB 7.3*  HCT 25.6*   Recent Labs    01/14/19 0506 01/15/19 0921  NA 147* 142  K 3.3* 3.4*  CL 105 100  CO2 30 31  GLUCOSE 268* 317*  BUN 49* 34*  CREATININE 1.48* 1.35*  CALCIUM 8.3* 8.7*    Intake/Output Summary (Last 24 hours) at 01/16/2019 0816 Last data filed at 01/16/2019 4481 Gross per 24 hour  Intake 220 ml  Output 2125 ml  Net -1905 ml     Physical Exam: Vital Signs Blood pressure 105/60, pulse 72, temperature 97.6 F (36.4 C), resp. rate 16, SpO2 98 %. Constitutional: No distress . Vital signs reviewed. HEENT: EOMI, oral membranes moist Neck: supple Cardiovascular: RRR without murmur. No JVD    Respiratory: CTA Bilaterally without wheezes or rales. Normal effort    GI: BS +, non-tender, non-distended  Musculoskeletal:Normal range of motion.  General: No edema.  Neurological: Alert and oriented.  UE 4- to 4/5 prox to distal. LE: 3/ 5 HF, KE and 4/5 ADF/PF.  decreased PP in both feet. DTR's 1+. Psych: pleasant    Assessment/Plan: 1. Functional deficits secondary to debility/encephalopathy which require 3+ hours per day of interdisciplinary therapy in a comprehensive inpatient rehab setting.  Physiatrist is providing close team supervision and 24 hour management of active medical problems listed below.  Physiatrist and rehab team continue to assess barriers to discharge/monitor patient progress toward functional and medical goals  Care Tool:  Bathing    Body parts bathed by patient: Right arm, Left arm, Chest, Abdomen, Front perineal area, Right upper leg, Left upper  leg, Face   Body parts bathed by helper: Right lower leg, Left lower leg, Buttocks     Bathing assist Assist Level: Moderate Assistance - Patient 50 - 74%     Upper Body Dressing/Undressing Upper body dressing   What is the patient wearing?: Pull over shirt    Upper body assist Assist Level: Supervision/Verbal cueing    Lower Body Dressing/Undressing Lower body dressing      What is the patient wearing?: Pants, Underwear/pull up     Lower body assist Assist for lower body dressing: Maximal Assistance - Patient 25 - 49%     Toileting Toileting    Toileting assist Assist for toileting: Minimal Assistance - Patient > 75%     Transfers Chair/bed transfer  Transfers assist  Chair/bed transfer activity did not occur: Safety/medical concerns  Chair/bed transfer assist level: Moderate Assistance - Patient 50 - 74%     Locomotion Ambulation   Ambulation assist      Assist level: Contact Guard/Touching assist Assistive device: Walker-rolling Max distance: 53f    Walk 10 feet activity   Assist     Assist level: Contact Guard/Touching assist Assistive device: Walker-rolling   Walk 50 feet activity   Assist Walk 50 feet with 2 turns activity did not occur: Safety/medical concerns(fatigue )         Walk 150 feet activity   Assist Walk 150 feet activity did not occur: Safety/medical concerns(fatigue )         Walk 10 feet on uneven  surface  activity   Assist Walk 10 feet on uneven surfaces activity did not occur: Safety/medical concerns(fatigue )         Wheelchair     Assist Will patient use wheelchair at discharge?: Yes Type of Wheelchair: Manual(TBD )    Wheelchair assist level: Minimal Assistance - Patient > 75% Max wheelchair distance: 81f    Wheelchair 50 feet with 2 turns activity    Assist        Assist Level: Minimal Assistance - Patient > 75%   Wheelchair 150 feet activity     Assist Wheelchair 150 feet  activity did not occur: Safety/medical concerns(fatigue )        Medical Problem List and Plan: 1.Decreased functional mobilitysecondary to acute on chronic hypoxic respiratory failure complicatedby toxicencephalopathy  -Continue CIR therapies including PT, OT   2. Antithrombotics: -DVT/anticoagulation:Xarelto -antiplatelet therapy: n/a 3. Pain Management:Neurontin 600 mg twice a day, oxycodone as needed 4. Mood:Provide emotional support -antipsychotic agents: None 5. Neuropsych: This patientiscapable of making decisions on hisown behalf. 6. Skin/Wound Care:Routine skin checks 7. Fluids/Electrolytes/Nutrition:Routine and out's with follow-up chemistries  -hypokalemia: kdur added 249m bid  -today's bmet pending  8. PAF. Cardiac rate controlled. Follow cardiology services. Continue Xarelto 9. Hypertension. Zebeta 2.5 mg daily,Bidil 20-37.5 mg 3 times a day 10.Acute on chronicc systolic congestive heart failure.   -Demadex 80 mg twice a day---resumed 3/14 by cardiology  -Monitor for any signs of fluid overload  -still no weights recorded--will reorder 11. Tobacco abuse/COPD. Patient on 3 L oxygen prior to admission. Continue low-dose prednisone. Nebulizer treatments every 4 hours as needed 12. Diabetes mellitus peripheral neuropathy.   -remains poorly controlled at present  -Levemir 15 units daily--increased to 20u beginning today 3/15  -no metformin due to Cr     13. Hyperlipidemia. Crestor 14. Acute on chronic anemia  -s/p 1u PRBC  -f/u cbc pending for today    LOS: 2 days A FACE TO FASanta Cruz/15/2020, 8:16 AM

## 2019-01-16 NOTE — Progress Notes (Signed)
Patient is on NIV at this time, tolerating it well.

## 2019-01-16 NOTE — Progress Notes (Signed)
Patient is on NIV at this time tolerating it well.

## 2019-01-17 ENCOUNTER — Inpatient Hospital Stay (HOSPITAL_COMMUNITY): Payer: Medicare Other | Admitting: Physical Therapy

## 2019-01-17 ENCOUNTER — Inpatient Hospital Stay (HOSPITAL_COMMUNITY): Payer: Medicare Other | Admitting: Occupational Therapy

## 2019-01-17 DIAGNOSIS — E876 Hypokalemia: Secondary | ICD-10-CM

## 2019-01-17 DIAGNOSIS — D649 Anemia, unspecified: Secondary | ICD-10-CM

## 2019-01-17 DIAGNOSIS — N179 Acute kidney failure, unspecified: Secondary | ICD-10-CM

## 2019-01-17 DIAGNOSIS — D72829 Elevated white blood cell count, unspecified: Secondary | ICD-10-CM

## 2019-01-17 DIAGNOSIS — I5023 Acute on chronic systolic (congestive) heart failure: Secondary | ICD-10-CM

## 2019-01-17 DIAGNOSIS — E1142 Type 2 diabetes mellitus with diabetic polyneuropathy: Secondary | ICD-10-CM

## 2019-01-17 DIAGNOSIS — Z9981 Dependence on supplemental oxygen: Secondary | ICD-10-CM

## 2019-01-17 LAB — GLUCOSE, CAPILLARY
GLUCOSE-CAPILLARY: 312 mg/dL — AB (ref 70–99)
Glucose-Capillary: 162 mg/dL — ABNORMAL HIGH (ref 70–99)
Glucose-Capillary: 291 mg/dL — ABNORMAL HIGH (ref 70–99)
Glucose-Capillary: 297 mg/dL — ABNORMAL HIGH (ref 70–99)

## 2019-01-17 LAB — BASIC METABOLIC PANEL
Anion gap: 11 (ref 5–15)
BUN: 32 mg/dL — ABNORMAL HIGH (ref 8–23)
CO2: 34 mmol/L — ABNORMAL HIGH (ref 22–32)
Calcium: 8.2 mg/dL — ABNORMAL LOW (ref 8.9–10.3)
Chloride: 95 mmol/L — ABNORMAL LOW (ref 98–111)
Creatinine, Ser: 1.24 mg/dL (ref 0.61–1.24)
GFR calc Af Amer: >60 mL/min (ref >60)
GFR calc non Af Amer: 59 mL/min — ABNORMAL LOW (ref >60)
Glucose, Bld: 189 mg/dL — ABNORMAL HIGH (ref 70–99)
POTASSIUM: 3.2 mmol/L — AB (ref 3.5–5.1)
Sodium: 140 mmol/L (ref 135–145)

## 2019-01-17 LAB — MAGNESIUM: Magnesium: 1.6 mg/dL — ABNORMAL LOW (ref 1.7–2.4)

## 2019-01-17 MED ORDER — POTASSIUM CHLORIDE CRYS ER 20 MEQ PO TBCR
20.0000 meq | EXTENDED_RELEASE_TABLET | Freq: Once | ORAL | Status: AC
  Administered 2019-01-17: 20 meq via ORAL
  Filled 2019-01-17: qty 1

## 2019-01-17 MED ORDER — POTASSIUM CHLORIDE CRYS ER 20 MEQ PO TBCR: 30.0000 meq | EXTENDED_RELEASE_TABLET | Freq: Two times a day (BID) | ORAL | Status: DC

## 2019-01-17 MED ORDER — POTASSIUM CHLORIDE CRYS ER 20 MEQ PO TBCR
40.0000 meq | EXTENDED_RELEASE_TABLET | Freq: Two times a day (BID) | ORAL | Status: DC
  Administered 2019-01-17 – 2019-01-21 (×8): 40 meq via ORAL
  Filled 2019-01-17 (×8): qty 2

## 2019-01-17 MED ORDER — MAGNESIUM OXIDE 400 (241.3 MG) MG PO TABS
400.0000 mg | ORAL_TABLET | Freq: Two times a day (BID) | ORAL | Status: DC
  Administered 2019-01-17 – 2019-01-24 (×14): 400 mg via ORAL
  Filled 2019-01-17 (×14): qty 1

## 2019-01-17 NOTE — Progress Notes (Addendum)
Physical Therapy Session Note  Patient Details  Name: Nicholas Caldwell MRN: [id number] Date of Birth: 19-Apr-1946  Today's Date: 01/17/2019 PT Individual Time: 1440-1535 PT Individual Time Calculation (min): 55 min   Short Term Goals: Week 1:  PT Short Term Goal 1 (Week 1): Patient to perform bed mobility with MinA on a consistent basis  PT Short Term Goal 2 (Week 1): Patient to be able to perform sit to stand with MinA from elevated surface on consistent basis  PT Short Term Goal 3 (Week 1): Patient to be able to self-propel WC 173f with VC/min guard  PT Short Term Goal 4 (Week 1): Patient to tolerate gait training approximately 271fwith RW and min guard   Skilled Therapeutic Interventions/Progress Updates:  Pt received in w/c & agreeable to tx. Transported pt to/from gym via w/c dependent assist for time management. Pt transfers sit<>stand with CGA and ambulates 70 ft with RW & CGA. Pt requires seated rest break 2/2 SOB & SpO2 = 85% but increased to 100% within 2 minutes with cuing for pursed lip breathing but only fair return demo from pt. Returned to room & pt ambulates in room/bathroom with RW & CGA, requires cuing to sit on toilet but performs toileting transfer with CGA. Pt with continent void on toilet and managed clothing without assistance. Pt performs hand hygiene at sink, leaning on sink with CGA. Back in gym pt performs standing therapeutic exercises with UE support on RW; exercises include: standing hip/knee flexion, heel raises, and mini squats with instructional cuing for technique & seated rest breaks in between PRN. Pt with habit of holding his breath during exercises with max cuing for pursed lip breathing. Reviewed safe hand placement for stand>sit transfers. At end of session pt left sitting in w/c in room with alarm set & needs at hand, SpO2 WNL.  Pt on 3L/min supplemental oxygen via nasal cannula throughout session.   Therapy Documentation Precautions:   Precautions Precautions: Fall Precaution Comments: 3-4L O2 [place] baseline Restrictions Weight Bearing Restrictions: No  Pain: No c/o pain reported.    Therapy/Group: Individual Therapy  ViWaunita Schooner/16/2020, 3:38 PM

## 2019-01-17 NOTE — IPOC Note (Signed)
Overall Plan of Care [facility]) Patient Details Name: Nicholas Caldwell MRN: [id number] DOB: 04-Oct-1946  Admitting Diagnosis: [facility] Problems: Active Problems:   Acute on chronic respiratory failure [facility])     Functional Problem List: Nursing Behavior, Bladder, Bowel, Pain, Nutrition, Medication Management, Safety, Skin Integrity  PT Balance, Safety, Endurance, Motor  OT Balance, Cognition, Edema, Endurance, Motor, Pain, Safety, Sensory  SLP    TR         Basic ADL's: OT Grooming, Bathing, Dressing, Toileting     Advanced  ADL's: OT       Transfers: PT Bed Mobility, Bed to Chair, Car, Furniture, Futures trader, Metallurgist: PT Ambulation, Emergency planning/management officer, Stairs     Additional Impairments: OT None  SLP        TR      Anticipated Outcomes Item Anticipated Outcome  Self Feeding S  Swallowing      Basic self-care  S  Toileting  S   Bathroom Transfers S  Bowel/Bladder  min assist  Transfers  S, LRAD   Locomotion  S, LRAD   Communication     Cognition     Pain  less<2  Safety/Judgment  min assist   Therapy Plan: PT Intensity: Minimum of 1-2 x/day ,45 to 90 minutes PT Frequency: 5 out of 7 days PT Duration Estimated Length of Stay: 2-3 weeks  OT Intensity: Minimum of 1-2 x/day, 45 to 90 minutes OT Frequency: 5 out of 7 days OT Duration/Estimated Length of Stay: 14-18      Team Interventions: Nursing Interventions Patient/Family Education, Bowel Management, Pain Management, Skin Care/Wound Management, Bladder Management, Disease Management/Prevention, Medication Management, Discharge Planning  PT interventions Ambulation/gait training, Community reintegration, DME/adaptive equipment instruction, Neuromuscular re-education, Psychosocial support, Stair training, UE/LE Strength taining/ROM, Wheelchair propulsion/positioning, Training and development officer, Discharge planning, Functional electrical stimulation, Pain management, Skin  care/wound management, Therapeutic Activities, UE/LE Coordination activities, Cognitive remediation/compensation, Disease management/prevention, Functional mobility training, Patient/family education, Splinting/orthotics, Therapeutic Exercise, Visual/perceptual remediation/compensation  OT Interventions Balance/vestibular training, Disease mangement/prevention, Neuromuscular re-education, Self Care/advanced ADL retraining, Therapeutic Exercise, Wheelchair propulsion/positioning, Cognitive remediation/compensation, DME/adaptive equipment instruction, Pain management, Skin care/wound managment, UE/LE Strength taining/ROM, Community reintegration, Functional electrical stimulation, Patient/family education, Splinting/orthotics, UE/LE Coordination activities, Discharge planning, Functional mobility training, Psychosocial support, Therapeutic Activities, Visual/perceptual remediation/compensation  SLP Interventions    TR Interventions    SW/CM Interventions Discharge Planning, Psychosocial Support, Patient/Family Education, Other (comment)   Barriers to Discharge MD  Medical stability  Nursing      PT      OT Inaccessible home environment    SLP      SW       Team Discharge Planning: Destination: PT-Home ,OT- Home , SLP-  Projected Follow-up: PT-Home health PT, OT-  Home health OT, SLP-  Projected Equipment Needs: PT-Rolling walker with 5" wheels, Tub/shower seat, 3 in 1 bedside comode, OT- Tub/shower bench, To be determined, SLP-  Equipment Details: PT- , OT-  Patient/family involved in discharge planning: PT- Patient,  OT-Patient, Family member/caregiver, SLP-   MD ELOS: 14-17 days. Medical Rehab Prognosis:  Good Assessment: 73 year old right-handed male history of atrial flutter/PAF and continues on Xarelto, CAD,chronic back pain maintained on Neurontin as well as oxycodone as needed,chronic anemia, chronic diastolic congestive heart failure, diabetes mellitus maintained on Glucophage 1000  mg twice a day and NovoLog 70/30, hypertension maintained on Bidil, COPD/tobacco abuse and used oxygen at home. Presented 12/30/2018 with increasing shortness of breath and approximately 12 pounds of  weight gain over the last 4 days. Increase edema lower extremities. Chest x-ray showed no interval change and congestive heart failure with edema and bilateral effusions. CT the chest showed small right pleural effusion as well as several small pulmonary nodules largest pulmonary nodule measuring 8 mm in the left upper lobe noncontrast CT of the chest recommended 3-6 months. Ultrasound the abdomen negative for significant ascites. Urine drug screen was positive for opiates and benzos. Acute on chronic anemia. EKG atrial fibrillationwith history of PAF maintained on Xarelto. Follow-up cardiology services. Patient did require DCCV 01/10/2019 and remained on intravenous heparin for a short time. Noted with ongoing acute respiratory failure related to CHF requiring intubation 01/04/2019 through 01/11/2019. Patient with resulting functional deficits with mobility, transfers, self-care.  Will set goals for Supervision with PT/OT.   See Team Conference Notes for weekly updates to the plan of care

## 2019-01-17 NOTE — Progress Notes (Signed)
Occupational Therapy Session Note  Patient Details  Name: Nicholas Caldwell MRN: [id number] Date of Birth: [birth year]  Today's Date: 01/17/2019 OT Individual Time: 7116-5790 OT Individual Time Calculation (min): 53 min   Short Term Goals: Week 1:  OT Short Term Goal 1 (Week 1): Pt will transfer to BSC/toilet with MIN A and LRAD OT Short Term Goal 2 (Week 1): Pt will thread BLE into pants wiht AE PRN and mod VC OT Short Term Goal 3 (Week 1): Pt will don socks with AE PRN and min VC for technique OT Short Term Goal 4 (Week 1): Pt will advance pants past hips in standing wiht MIN A  Skilled Therapeutic Interventions/Progress Updates:    Pt greeted semi-reclined in bed and agreeable to OT treatment session. Pt requests to shower today. Pt on 2L of O2 throughout session. Sit<>stand from raised bed with min A and verbal cues not to pull up on RW to stand. Pt ambulated into bathroom w/ RW and min A. Bathing completed seated on shower bench with assist to wash buttocks and LEs. Pt needed rest breaks within BADL tasks. Stand-pivot out of shower with heavy use of grab bars and min A. Dressing completed from wc level with assistance to don TED hose, socks, and thread R LE into pant legs. Min A sit<>stand from wc w/ RW. Pt left seated in wc at end of session with safety alarm belt on and needs met.   Therapy Documentation Precautions:  Precautions Precautions: Fall Precaution Comments: 3-4L O2 [place] baseline Restrictions Weight Bearing Restrictions: No Pain: 0-10 4, hip, repositioned  Therapy/Group: Individual Therapy  Valma Cava 01/17/2019, 8:39 AM

## 2019-01-17 NOTE — Care Management (Signed)
[facility] Individual Statement of Services  Patient Name:  Nicholas Caldwell  Date:  01/17/2019  Welcome to the [facility].  Our goal is to provide you with an individualized program based on your diagnosis and situation, designed to meet your specific needs.  With this comprehensive rehabilitation program, you will be expected to participate in at least 3 hours of rehabilitation therapies Monday-Friday, with modified therapy programming on the weekends.  Your rehabilitation program will include the following services:  Physical Therapy (PT), Occupational Therapy (OT), 24 hour per day rehabilitation nursing, Neuropsychology, Case Management (Social Worker), Rehabilitation Medicine, Nutrition Services and Pharmacy Services  Weekly team conferences will be held on Wednesdays to discuss your progress.  Your Social Worker will talk with you frequently to get your input and to update you on team discussions.  Team conferences with you and your family in attendance may also be held.  Expected length of stay: 14-18 days   Overall anticipated outcome: supervision  Depending on your progress and recovery, your program may change. Your Social Worker will coordinate services and will keep you informed of any changes. Your Social Worker's name and contact numbers are listed  below.  The following services may also be recommended but are not provided by the [place] will be made to provide these services after discharge if needed.  Arrangements include referral to agencies that provide these services.  Your insurance has been verified to be:  Medicare Your primary doctor is:  [facility]  Pertinent information will be shared with your doctor and your insurance company.  Social Worker:  [place], [place] or [phone]   Information discussed with and copy given to patient by: Lennart Pall, 01/17/2019, 4:17 PM

## 2019-01-17 NOTE — Progress Notes (Signed)
Inpatient Rehabilitation  Patient information reviewed and entered into eRehab system by [facility]. Loni Beckwith., CCC/SLP, PPS Coordinator.  Information including medical coding, functional ability and quality indicators will be reviewed and updated through discharge.    Per nursing patient was given "Data Collection Information Summary" for Patients in Inpatient Rehabilitation Facilities with attached "Privacy Act [place] Records" upon admission.

## 2019-01-17 NOTE — Plan of Care (Signed)
  Problem: Consults Goal: RH GENERAL PATIENT EDUCATION Description See Patient Education module for education specifics. Outcome: Progressing Goal: Skin Care Protocol Initiated - if Braden Score 18 or less Description If consults are not indicated, leave blank or document N/A Outcome: Progressing Goal: Nutrition Consult-if indicated Outcome: Progressing Goal: Diabetes Guidelines if Diabetic/Glucose > 140 Description If diabetic or lab glucose is > 140 mg/dl - Initiate Diabetes/Hyperglycemia Guidelines & Document Interventions  Outcome: Progressing   Problem: RH BOWEL ELIMINATION Goal: RH STG MANAGE BOWEL WITH ASSISTANCE Description STG Manage Bowel with min.Assistance.  Outcome: Progressing Goal: RH STG MANAGE BOWEL W/MEDICATION W/ASSISTANCE Description STG Manage Bowel with Medication with min.Assistance.  Outcome: Progressing   Problem: RH BLADDER ELIMINATION Goal: RH STG MANAGE BLADDER WITH ASSISTANCE Description STG Manage Bladder With min.Assistance  Outcome: Progressing Goal: RH STG MANAGE BLADDER WITH MEDICATION WITH ASSISTANCE Description STG Manage Bladder With Medication With min. Assistance.  Outcome: Progressing Goal: RH STG MANAGE BLADDER WITH EQUIPMENT WITH ASSISTANCE Description STG Manage Bladder With Equipment With Assistance Outcome: Progressing   Problem: RH SKIN INTEGRITY Goal: RH STG SKIN FREE OF INFECTION/BREAKDOWN Description With min. assist  Outcome: Progressing Goal: RH STG MAINTAIN SKIN INTEGRITY WITH ASSISTANCE Description STG Maintain Skin Integrity With min.Assistance.  Outcome: Progressing Goal: RH STG ABLE TO PERFORM INCISION/WOUND CARE W/ASSISTANCE Description STG Able To Perform Incision/Wound Care With min.Assistance.  Outcome: Progressing   Problem: RH SAFETY Goal: RH STG ADHERE TO SAFETY PRECAUTIONS W/ASSISTANCE/DEVICE Description STG Adhere to Safety Precautions With min.Assistance/Device.  Outcome: Progressing Goal: RH  STG DECREASED RISK OF FALL WITH ASSISTANCE Description STG Decreased Risk of Fall With min.Assistance.  Outcome: Progressing   Problem: RH PAIN MANAGEMENT Goal: RH STG PAIN MANAGED AT OR BELOW PT'S PAIN GOAL Description Less than 3  Outcome: Progressing   Problem: RH KNOWLEDGE DEFICIT GENERAL Goal: RH STG INCREASE KNOWLEDGE OF SELF CARE AFTER HOSPITALIZATION Outcome: Progressing

## 2019-01-17 NOTE — Progress Notes (Signed)
[facility] PHYSICAL MEDICINE & REHABILITATION PROGRESS NOTE   Subjective/Complaints: Patient seen sitting up in bed this morning.  He states he slept well overnight.  He states he had a good weekend.  He is awaiting therapies.  ROS: Denies CP, SOB, N/V/D  Objective:   No results found. Recent Labs    01/14/19 1201 01/16/19 0708  WBC  --  13.7*  HGB 7.3* 8.4*  HCT 25.6* 29.0*  PLT  --  345   Recent Labs    01/16/19 0708 01/17/19 0606  NA 139 140  K 3.2* 3.2*  CL 96* 95*  CO2 32 34*  GLUCOSE 184* 189*  BUN 30* 32*  CREATININE 1.20 1.24  CALCIUM 8.0* 8.2*    Intake/Output Summary (Last 24 hours) at 01/17/2019 0829 Last data filed at 01/17/2019 0440 Gross per 24 hour  Intake 360 ml  Output 2550 ml  Net -2190 ml     Physical Exam: Vital Signs Blood pressure 108/61, pulse 70, temperature 98.4 F (36.9 C), temperature source Oral, resp. rate 18, weight 112.6 kg, SpO2 100 %. Constitutional: No distress . Vital signs reviewed. HENT: Normocephalic.  Atraumatic. Eyes: EOMI. No discharge. Cardiovascular: RRR. No JVD. Respiratory: CTA Bilaterally. Normal effort. GI: BS +. Non-distended. Musc: No edema or tenderness in extremities. Neurological: Alert and oriented.   Motor: 4-4+/5 throughout  Psych: Normal mood.  Normal behavior.  Assessment/Plan: 1. Functional deficits secondary to debility/encephalopathy which require 3+ hours per day of interdisciplinary therapy in a comprehensive inpatient rehab setting.  Physiatrist is providing close team supervision and 24 hour management of active medical problems listed below.  Physiatrist and rehab team continue to assess barriers to discharge/monitor patient progress toward functional and medical goals  Care Tool:  Bathing    Body parts bathed by patient: Right arm, Left arm, Chest, Abdomen, Right upper leg, Left upper leg, Face   Body parts bathed by helper: Front perineal area, Buttocks, Right lower leg, Left lower  leg     Bathing assist Assist Level: Minimal Assistance - Patient > 75%     Upper Body Dressing/Undressing Upper body dressing   What is the patient wearing?: Hospital gown only, Pull over shirt    Upper body assist Assist Level: Minimal Assistance - Patient > 75%    Lower Body Dressing/Undressing Lower body dressing      What is the patient wearing?: Pants, Underwear/pull up     Lower body assist Assist for lower body dressing: Maximal Assistance - Patient 25 - 49%     Toileting Toileting    Toileting assist Assist for toileting: Minimal Assistance - Patient > 75%     Transfers Chair/bed transfer  Transfers assist  Chair/bed transfer activity did not occur: Safety/medical concerns  Chair/bed transfer assist level: Moderate Assistance - Patient 50 - 74%     Locomotion Ambulation   Ambulation assist      Assist level: Contact Guard/Touching assist Assistive device: Walker-rolling Max distance: 21f    Walk 10 feet activity   Assist     Assist level: Contact Guard/Touching assist Assistive device: Walker-rolling   Walk 50 feet activity   Assist Walk 50 feet with 2 turns activity did not occur: Safety/medical concerns(fatigue )         Walk 150 feet activity   Assist Walk 150 feet activity did not occur: Safety/medical concerns(fatigue )         Walk 10 feet on uneven surface  activity   Assist Walk 10 feet  on uneven surfaces activity did not occur: Safety/medical concerns(fatigue )         Wheelchair     Assist Will patient use wheelchair at discharge?: Yes Type of Wheelchair: Manual(TBD )    Wheelchair assist level: Minimal Assistance - Patient > 75% Max wheelchair distance: 74f    Wheelchair 50 feet with 2 turns activity    Assist        Assist Level: Minimal Assistance - Patient > 75%   Wheelchair 150 feet activity     Assist Wheelchair 150 feet activity did not occur: Safety/medical concerns(fatigue  )        Medical Problem List and Plan: 1.Decreased functional mobilitysecondary to acute on chronic hypoxic respiratory failure complicatedby toxicencephalopathy   Continue CIR  Notes reviewed, labs reviewed 2. Antithrombotics: -DVT/anticoagulation:Xarelto -antiplatelet therapy: n/a 3. Pain Management:Neurontin 600 mg twice a day, oxycodone as needed 4. Mood:Provide emotional support -antipsychotic agents: None 5. Neuropsych: This patientiscapable of making decisions on hisown behalf. 6. Skin/Wound Care:Routine skin checks 7. Fluids/Electrolytes/Nutrition:Routine and out's 8. PAF. Cardiac rate controlled. Follow cardiology services. Continue Xarelto 9. Hypertension. Zebeta 2.5 mg daily,Bidil 20-37.5 mg 3 times a day  Relatively controlled on 3/16 10.Acute on chronic systolic congestive heart failure.   Demadex 80 mg twice a day---resumed 3/14 by cardiology    Monitor for any signs of fluid overload Filed Weights   01/16/19 1115 01/17/19 0419  Weight: 112.9 kg 112.6 kg  11. Tobacco abuse/COPD. Patient on 3 L oxygen prior to admission. Continue low-dose prednisone. Nebulizer treatments every 4 hours as needed 12. Diabetes mellitus peripheral neuropathy.   -Levemir increased to 20u on 3/15  -no metformin due to Cr   Level on 3/16, will consider adding mealtime coverage tomorrow if necessary 13. Hyperlipidemia. Crestor 14. Acute on chronic anemia  -s/p 1u PRBC  Hemoglobin 8.4 on 3/15  Continue to monitor 15.  Hypokalemia  Potassium 3.2 on 3/16  Supplement increased on 3/16  Magnesium level ordered 16.  AKI  Creatinine 1.24 on 3/16  Encourage fluids  Continue to monitor 17.  Leukocytosis  WBCs 13.7 on 3/15  Afebrile  Continue to monitor   LOS: 3 days A FACE TO FACE EVALUATION WAS PERFORMED  Faithanne Verret ALorie Phenix3/16/2020, 8:29 AM

## 2019-01-17 NOTE — Progress Notes (Signed)
Physical Therapy Session Note  Patient Details  Name: Nicholas Caldwell MRN: [id number] Date of Birth: 07/13/1946  Today's Date: 01/17/2019 PT Individual Time: 1000-1055 PT Individual Time Calculation (min): 55 min   Short Term Goals: Week 1:  PT Short Term Goal 1 (Week 1): Patient to perform bed mobility with MinA on a consistent basis  PT Short Term Goal 2 (Week 1): Patient to be able to perform sit to stand with MinA from elevated surface on consistent basis  PT Short Term Goal 3 (Week 1): Patient to be able to self-propel WC 145f with VC/min guard  PT Short Term Goal 4 (Week 1): Patient to tolerate gait training approximately 258fwith RW and min guard   Skilled Therapeutic Interventions/Progress Updates:    pt performs sit <> stand and stand pivot transfers with RW throughout session with min A.  Gait with RW 110', 100' with CGA, cues for upright posture and pt tends to lean forward on elbows on RW.  Pt states this is "more comfortable", PT explains that this is not safe or good posture for breathing.  Pt requires frequent standing rest breaks during gait.  SpO2 92% on 3L during gait.  Standing alternating tap ups to 4'' step with min A with RW, max A without RW.  Step ups to 4'' step 2 x 10 bilat with RW and min A.  Pt propels w/c for strength and endurance x 50' with supervision and increased time.  Pt left in room with alarm set, RN present, needs at hand.   Therapy Documentation Precautions:  Precautions Precautions: Fall Precaution Comments: 3-4L O2 [place] baseline Restrictions Weight Bearing Restrictions: No Pain: Pain Assessment Pain Scale: 0-10 Pain Score: 8  Faces Pain Scale: Hurts whole lot Pain Type: Acute pain Pain Location: Hip Pain Orientation: Right Pain Descriptors / Indicators: Cramping Pain Frequency: Intermittent Pain Onset: Sudden Patients Stated Pain Goal: 1 Pain Intervention(s):RN made aware Multiple Pain Sites: No   Therapy/Group: Individual  Therapy  Sarafina Puthoff 01/17/2019, 10:56 AM

## 2019-01-17 NOTE — Progress Notes (Signed)
Social Work  Social Work Assessment and Plan  Patient Details  Name: Nicholas Caldwell MRN: [id number] Date of Birth: Dec 14, 1945  Today's Date: 01/17/2019  Problem List:  Patient Active Problem List   Diagnosis Date Noted  . Hypomagnesemia   . Steroid-induced hyperglycemia   . Diabetes mellitus type 2 in nonobese (HCC)   . Supplemental oxygen dependent   . Leukocytosis   . Hypokalemia   . Acute on chronic anemia   . Diabetic peripheral neuropathy ([place])   . Acute on chronic systolic (congestive) heart failure ([place])   . Acute on chronic respiratory failure ([place]) 01/14/2019  . Hypoxia   . Altered mental status   . Heart failure ([place]) 12/30/2018  . AKI (acute kidney injury) ([place])   . Acute respiratory failure ([place]) 11/22/2018  . Acute on chronic respiratory failure with hypoxia and hypercapnia ([place]) 11/13/2018  . Metabolic encephalopathy 42/70/6237  . Acute respiratory failure with hypoxia and hypercapnia ([place]) 07/26/2018  . Acute exacerbation of CHF (congestive heart failure) ([place]) 07/26/2018  . CHF exacerbation ([place]) 05/01/2018  . CHF (congestive heart failure) ([place]) 04/30/2018  . Chronic respiratory failure with hypoxia ([place]) 12/28/2017  . Atrial fibrillation with RVR ([place])   . SVT (supraventricular tachycardia) ([place])   . COPD GOLD 0   . Medically noncompliant   . Panlobular emphysema ([place])   . OSA (obstructive sleep apnea)   . Pulmonary hypertension ([place])   . Disorientation   . Pressure injury of skin 09/07/2016  . Acute on chronic combined systolic and diastolic CHF (congestive heart failure) ([place]) 09/05/2016  . Skin lesion-left heal 09/05/2016  . Chronic systolic CHF (congestive heart failure) ([place]) 07/16/2016  . COPD (chronic obstructive pulmonary disease) ([place]) 07/16/2016  . Uncontrolled type 2 diabetes mellitus with complication ([place])   . Diabetic polyneuropathy associated with diabetes mellitus due to underlying condition ([place])   . Normocytic anemia 06/29/2016  . CAD  - Non-obstructive by LHC1/16 01/21/2016  . Nonischemic cardiomyopathy ([place]) 10/09/2015  . PAF (paroxysmal atrial fibrillation) ([place])   . Chronic pain 02/19/2015  . Morbid obesity ([place]) 02/13/2015  . Dyspnea   . Elevated troponin I level 11/01/2014  . COPD exacerbation ([place]) 11/01/2014  . Essential hypertension 10/31/2014  . Type 2 diabetes mellitus with neuropathy 10/31/2014  . Hyperlipidemia  10/31/2014  . Cigarette smoker 10/31/2014   Past Medical History:  Past Medical History:  Diagnosis Date  . Atrial flutter ([place])    a. recurrent AFlutter with RVR;  b. Amiodarone Rx started 4/16  . CAD (coronary artery disease)    a. LHC 1/16:  mLAD diffuse disease, pLCx mild disease, dLCx with disease but too small for PCI, RCA ok, EF 25-30%  . Chronic pain   . Chronic systolic CHF (congestive heart failure) ([place])   . COPD (chronic obstructive pulmonary disease) ([place])   . Diabetes mellitus without complication ([place])   . Hypercholesteremia   . Hypertension   . NICM (nonischemic cardiomyopathy) ([place])    a.dx 2016. b. 2D echo 06/2016 - Last echo 07/01/16: mod dilated LV, mod LVH, EF 25-30%, mild-mod MR, sev LAE, mild-mod reduced RV systolic function, mild-mod TR, PASP 85mHG.  .Marland KitchenPAF (paroxysmal atrial fibrillation) (HCC)    On amio - ot a candidate for flecainide due to cardiomyopathy, not a candidate for Tikosyn due to prolonged QT, and felt to be a poor candidate for ablation given left atrial size.  . Pulmonary hypertension ([place]   . Tobacco abuse    Past Surgical History:  Past Surgical History:  Procedure Laterality Date  . CARDIOVERSION N/A 07/30/2018   Procedure: CARDIOVERSION;  Surgeon: Larey Dresser, MD;  Location: [facility] ENDOSCOPY;  Service: Cardiovascular;  Laterality: N/A;  . LEFT AND RIGHT HEART CATHETERIZATION WITH CORONARY ANGIOGRAM N/A 11/06/2014   Procedure: LEFT AND RIGHT HEART CATHETERIZATION WITH CORONARY ANGIOGRAM;  Surgeon: Jettie Booze, MD;  Location: [facility] CATH LAB;   Service: Cardiovascular;  Laterality: N/A;  . RIGHT/LEFT HEART CATH AND CORONARY ANGIOGRAPHY N/A 12/06/2018   Procedure: RIGHT/LEFT HEART CATH AND CORONARY ANGIOGRAPHY;  Surgeon: Larey Dresser, MD;  Location: [facility] CV LAB;  Service: Cardiovascular;  Laterality: N/A;  . SPLENECTOMY     Social History:  reports that he quit smoking about 7 weeks ago. His smoking use included cigarettes. He has a 34.00 pack-year smoking history. He has never used smokeless tobacco. He reports that he does not drink alcohol or use drugs.  Family / Support Systems Marital Status: Married Patient Roles: Spouse, Parent Spouse/Significant Other: wife, Hildred Mollica @ [phone] Children: daughter, Sima Matas [facility]) @ [phone] Anticipated Caregiver: wife Ability/Limitations of Caregiver: no limitations - pt reports that wife is in good health and able to provide physical assistance. Caregiver Availability: 24/7 Family Dynamics: Pt describes wife as very supportive and denies any concerns about available assistance at d/c.  Social History Preferred language: English Religion: Baptist Cultural Background: NA Read: Yes Write: Yes Employment Status: Retired Public relations account executive Issues: None Guardian/Conservator: None - per MD, pt is capable of making decisions on his own behalf.   Abuse/Neglect Abuse/Neglect Assessment Can Be Completed: Yes Physical Abuse: Denies Verbal Abuse: Denies Sexual Abuse: Denies Exploitation of patient/patient's resources: Denies Self-Neglect: Denies  Emotional Status Pt's affect, behavior and adjustment status: Pt sitting up in bed and reports fatigue from full day of therapies.  He is able to complete the assessment interview without difficulty and denies any significant emotional distress - will monitor.  He is hopeful that he will be able to regain strength and return to his prior independent functional level. Recent Psychosocial Issues:  None Psychiatric History: None Substance Abuse History: None  Patient / Family Perceptions, Expectations & Goals Pt/Family understanding of illness & functional limitations: Pt with good understanding of his medical issues including CHF which developed into resp failure.  States, "I laid in the bed for so long that my body has gotten weak." Premorbid pt/family roles/activities: Pt was independent overall, however, needed occasional assist dependent on chronic issues. Anticipated changes in roles/activities/participation: Little change anticipated if pt able to reach supervision goals. Pt/family expectations/goals: "I want to be able to get around on my own."  [employer]: None Premorbid Home Care/DME Agencies: Other (Comment)([facility] for O2 and HH) Transportation available at discharge: yes  Discharge Planning Living Arrangements: Spouse/significant other Support Systems: Spouse/significant other, Other relatives, Friends/neighbors Type of Residence: Private residence Insurance Resources: [employer] Financial Resources: Radio broadcast assistant Screen Referred: No Living Expenses: Medical laboratory scientific officer Management: Spouse Does the patient have any problems obtaining your medications?: No Home Management: mostly wife managing the home Patient/Family Preliminary Plans: Pt to return home with wife providing needed assistance. Social Work Anticipated Follow Up Needs: HH/OP Expected length of stay: 14-18 days  Clinical Impression Pleasant gentleman here for debility following resp failure r/t CHF.  Good understanding of role of CIR and good support from wife who can cover 24/7 care.  Pt very motivated for therapies and "ready to work."  Denies any significant emotional distress, however,  will monitor throughout stay.  Karyssa Amaral 01/17/2019, 4:18 PM

## 2019-01-18 ENCOUNTER — Inpatient Hospital Stay (HOSPITAL_COMMUNITY): Payer: Self-pay | Admitting: Physical Therapy

## 2019-01-18 ENCOUNTER — Inpatient Hospital Stay (HOSPITAL_COMMUNITY): Payer: Medicare Other | Admitting: Physical Therapy

## 2019-01-18 ENCOUNTER — Inpatient Hospital Stay (HOSPITAL_COMMUNITY): Payer: Medicare Other | Admitting: Occupational Therapy

## 2019-01-18 DIAGNOSIS — R739 Hyperglycemia, unspecified: Secondary | ICD-10-CM

## 2019-01-18 DIAGNOSIS — E119 Type 2 diabetes mellitus without complications: Secondary | ICD-10-CM

## 2019-01-18 DIAGNOSIS — D72828 Other elevated white blood cell count: Secondary | ICD-10-CM

## 2019-01-18 DIAGNOSIS — T380X5A Adverse effect of glucocorticoids and synthetic analogues, initial encounter: Secondary | ICD-10-CM

## 2019-01-18 LAB — GLUCOSE, CAPILLARY
Glucose-Capillary: 223 mg/dL — ABNORMAL HIGH (ref 70–99)
Glucose-Capillary: 238 mg/dL — ABNORMAL HIGH (ref 70–99)
Glucose-Capillary: 227 mg/dL — ABNORMAL HIGH (ref 70–99)
Glucose-Capillary: 248 mg/dL — ABNORMAL HIGH (ref 70–99)

## 2019-01-18 LAB — BASIC METABOLIC PANEL
Anion gap: 13 (ref 5–15)
BUN: 34 mg/dL — ABNORMAL HIGH (ref 8–23)
CO2: 35 mmol/L — ABNORMAL HIGH (ref 22–32)
Calcium: 8.6 mg/dL — ABNORMAL LOW (ref 8.9–10.3)
Chloride: 95 mmol/L — ABNORMAL LOW (ref 98–111)
Creatinine, Ser: 1.38 mg/dL — ABNORMAL HIGH (ref 0.61–1.24)
GFR calc Af Amer: 60 mL/min — ABNORMAL LOW (ref >60)
GFR calc non Af Amer: 51 mL/min — ABNORMAL LOW (ref >60)
Glucose, Bld: 228 mg/dL — ABNORMAL HIGH (ref 70–99)
Potassium: 3.3 mmol/L — ABNORMAL LOW (ref 3.5–5.1)
Sodium: 143 mmol/L (ref 135–145)

## 2019-01-18 MED ORDER — INSULIN ASPART 100 UNIT/ML ~~LOC~~ SOLN
4.0000 [IU] | Freq: Three times a day (TID) | SUBCUTANEOUS | Status: DC
  Administered 2019-01-18 – 2019-01-24 (×19): 4 [IU] via SUBCUTANEOUS

## 2019-01-18 NOTE — Progress Notes (Signed)
Occupational Therapy Session Note  Patient Details  Name: Nicholas Caldwell MRN: [id number] Date of Birth: 11/25/1945  Today's Date: 01/18/2019 OT Individual Time: 1000-1100 OT Individual Time Calculation (min): 60 min   Short Term Goals: Week 1:  OT Short Term Goal 1 (Week 1): Pt will transfer to BSC/toilet with MIN A and LRAD OT Short Term Goal 2 (Week 1): Pt will thread BLE into pants wiht AE PRN and mod VC OT Short Term Goal 3 (Week 1): Pt will don socks with AE PRN and min VC for technique OT Short Term Goal 4 (Week 1): Pt will advance pants past hips in standing wiht MIN A  Skilled Therapeutic Interventions/Progress Updates:    Pt greeted seated in wc and agreeable to OT treatment session. Pt reported need to go to the bathroom. Pt on 3L of O2 throughout session. Sit<>stand from wc with rw and min A. Pt then ambulated to bathroom with Min A and verbal cues for RW positioning over toilet to stand to void. Pt needed min A for balance when managing clothing. Pt then ambulated out of bathroom to wc for seated rest break. SpO2 97 after rest break. Pt reported feeling very fatigued after standing to urinate. Discussed trying to sit down next time for energy conservation. B UE strengthening and coordination with wc propulsion. Pt reached max fatigue after 15 feet. B UE strengthening and endurance with arm bike on level 3 for 5 mins x 3 sets. Extended rest breaks in between. Pt returned to room and left seated in wc with safety alarm belt on and needs met.  Therapy Documentation Precautions:  Precautions Precautions: Fall Precaution Comments: 3-4L O2 [place] baseline Restrictions Weight Bearing Restrictions: No Pain: Pain Assessment Pain Scale: 0-10 Pain Score: 9  Pain Type: Acute pain Pain Location: Hip Pain Orientation: Right Pain Descriptors / Indicators: Discomfort Pain Frequency: Intermittent Pain Onset: Sudden Patients Stated Pain Goal: 0 Pain Intervention(s):  Repositioned   Therapy/Group: Individual Therapy  Valma Cava 01/18/2019, 10:33 AM

## 2019-01-18 NOTE — Progress Notes (Signed)
Physical Therapy Session Note  Patient Details  Name: Nicholas Caldwell MRN: [id number] Date of Birth: 01-Nov-1946  Today's Date: 01/18/2019 PT Individual Time: 5427-0623 AND 1400-1500 PT Individual Time Calculation (min): 69 min AND 60 min  Short Term Goals: Week 1:  PT Short Term Goal 1 (Week 1): Patient to perform bed mobility with MinA on a consistent basis  PT Short Term Goal 2 (Week 1): Patient to be able to perform sit to stand with MinA from elevated surface on consistent basis  PT Short Term Goal 3 (Week 1): Patient to be able to self-propel WC 127f with VC/min guard  PT Short Term Goal 4 (Week 1): Patient to tolerate gait training approximately 242fwith RW and min guard   Skilled Therapeutic Interventions/Progress Updates:   Session 1:  Pt in supine and agreeable to therapy, pain as detailed below. Supervision transfer to EOB and min assist to thread pants, supervision to don shirt. Pt pulled up pants in standing w/ CGA prior to stand pivot transfer to w/c. Total assist w/c transport to therapy gym for energy conservation. Pt @ 99% on 3L/min supplemental O2 at rest. Ambulated 100' and 75' w/ RW and CGA to close supervision. O2 95% on 4 L/min during 1st walk, down to 83% on 4 L during 2nd walk. Turned up to 6 L/min and returned to 95% by end of 2nd walk. Discussed pt's PLOF compared to CLOF, pt states he feels he is getting around better now than prior to coming into hospital. Educated pt on his ongoing endurance benefits and importance of pt continuing w/ PT after discharge. Performed kinetron 3 min and 2 min @ 50 cm/sec w/ BLEs to work on endurance and LE strengthening. Increased work of breathing throughout session, prolonged seated rest breaks prior to return to baseline breathing rate. Returned to room via w/c, ended session in w/c and all needs in reach.   Session 2:  Pt in w/c and agreeable to therapy, no c/o pain. Pt self-propelled w/c to day room w/ BUEs, >150', to work on enFurniture conservator/restorerNeeded 2-3 rest breaks 2/2 increased work of breathing and fatigue. O2 @ 99% on 4 L/min. NuStep 5 min and 3 min @ level 3 for global strengthening and endurance, O2 @ 100% on 4 L/min. Increased work of breathing w/ NuStep as well, did not deem it safe to turn down to 3 L/min. Prolonged seated rest breaks throughout session for breathing to return to baseline rate. Returned to room total assist in w/c, ended session in supine w/ all needs in reach.   Therapy Documentation Precautions:  Precautions Precautions: Fall Precaution Comments: 3-4L O2 [place] baseline Restrictions Weight Bearing Restrictions: No Vital Signs: Therapy Vitals Temp: 98 F (36.7 C) Temp Source: Oral Pulse Rate: 72 Resp: 18 BP: 109/61 Oxygen Therapy SpO2: 100 % O2 Device: Nasal Cannula O2 Flow Rate (L/min): 3 L/min Pain: Pain Assessment Pain Scale: 0-10 Pain Score: 9  Pain Type: Acute pain Pain Location: Hip Pain Orientation: Right Pain Descriptors / Indicators: Discomfort Pain Frequency: Intermittent Pain Onset: Sudden Patients Stated Pain Goal: 0 Pain Intervention(s): Medication (See eMAR)  Therapy/Group: Individual Therapy  Brallan Denio K Miya Luviano 01/18/2019, 8:40 AM

## 2019-01-18 NOTE — Progress Notes (Signed)
[place] PHYSICAL MEDICINE & REHABILITATION PROGRESS NOTE   Subjective/Complaints: Patient seen laying in bed this morning.  He states he slept well overnight.  He states he had a tiring day of therapies yesterday, but was able to get through it.  ROS: Denies CP, SOB, N/V/D  Objective:   No results found. Recent Labs    01/16/19 0708  WBC 13.7*  HGB 8.4*  HCT 29.0*  PLT 345   Recent Labs    01/17/19 0606 01/18/19 0513  NA 140 143  K 3.2* 3.3*  CL 95* 95*  CO2 34* 35*  GLUCOSE 189* 228*  BUN 32* 34*  CREATININE 1.24 1.38*  CALCIUM 8.2* 8.6*    Intake/Output Summary (Last 24 hours) at 01/18/2019 0752 Last data filed at 01/18/2019 0545 Gross per 24 hour  Intake 888 ml  Output 275 ml  Net 613 ml     Physical Exam: Vital Signs Blood pressure 109/61, pulse 72, temperature 98 F (36.7 C), temperature source Oral, resp. rate 18, weight 112.6 kg, SpO2 100 %. Constitutional: No distress . Vital signs reviewed. HENT: Normocephalic.  Atraumatic. Eyes: EOMI. No discharge. Cardiovascular: RRR.  No JVD. Respiratory: CTA bilaterally.  Normal effort.  + [place]. GI: BS +. Non-distended. Musc: No edema or tenderness in extremities. Neurological: Alert and oriented.   Motor: 4-4+/5 throughout, stable Psych: Normal mood.  Normal behavior.  Assessment/Plan: 1. Functional deficits secondary to debility/encephalopathy which require 3+ hours per day of interdisciplinary therapy in a comprehensive inpatient rehab setting.  Physiatrist is providing close team supervision and 24 hour management of active medical problems listed below.  Physiatrist and rehab team continue to assess barriers to discharge/monitor patient progress toward functional and medical goals  Care Tool:  Bathing    Body parts bathed by patient: Right arm, Left arm, Chest, Abdomen, Right upper leg, Left upper leg, Face   Body parts bathed by helper: Front perineal area, Buttocks, Right lower leg, Left lower  leg     Bathing assist Assist Level: Minimal Assistance - Patient > 75%     Upper Body Dressing/Undressing Upper body dressing   What is the patient wearing?: Hospital gown only, Pull over shirt    Upper body assist Assist Level: Minimal Assistance - Patient > 75%    Lower Body Dressing/Undressing Lower body dressing      What is the patient wearing?: Pants, Underwear/pull up     Lower body assist Assist for lower body dressing: Maximal Assistance - Patient 25 - 49%     Toileting Toileting    Toileting assist Assist for toileting: Minimal Assistance - Patient > 75%     Transfers Chair/bed transfer  Transfers assist  Chair/bed transfer activity did not occur: Safety/medical concerns  Chair/bed transfer assist level: Moderate Assistance - Patient 50 - 74%     Locomotion Ambulation   Ambulation assist      Assist level: Contact Guard/Touching assist Assistive device: Walker-rolling Max distance: 70 ft    Walk 10 feet activity   Assist     Assist level: Contact Guard/Touching assist Assistive device: Walker-rolling   Walk 50 feet activity   Assist Walk 50 feet with 2 turns activity did not occur: Safety/medical concerns(fatigue )  Assist level: Contact Guard/Touching assist Assistive device: Walker-rolling    Walk 150 feet activity   Assist Walk 150 feet activity did not occur: Safety/medical concerns(fatigue )         Walk 10 feet on uneven surface  activity  Assist Walk 10 feet on uneven surfaces activity did not occur: Safety/medical concerns(fatigue )         Wheelchair     Assist Will patient use wheelchair at discharge?: Yes Type of Wheelchair: Manual(TBD )    Wheelchair assist level: Minimal Assistance - Patient > 75% Max wheelchair distance: 56f    Wheelchair 50 feet with 2 turns activity    Assist        Assist Level: Minimal Assistance - Patient > 75%   Wheelchair 150 feet activity     Assist  Wheelchair 150 feet activity did not occur: Safety/medical concerns(fatigue )        Medical Problem List and Plan: 1.Decreased functional mobilitysecondary to acute on chronic hypoxic respiratory failure complicatedby toxicencephalopathy   Continue CIR  Continue steroid taper 2. Antithrombotics: -DVT/anticoagulation:Xarelto -antiplatelet therapy: n/a 3. Pain Management:Neurontin 600 mg twice a day, oxycodone as needed 4. Mood:Provide emotional support -antipsychotic agents: None 5. Neuropsych: This patientiscapable of making decisions on hisown behalf. 6. Skin/Wound Care:Routine skin checks 7. Fluids/Electrolytes/Nutrition:Routine and out's 8. PAF. Cardiac rate controlled. Follow cardiology services. Continue Xarelto 9. Hypertension. Zebeta 2.5 mg daily,Bidil 20-37.5 mg 3 times a day  Controlled on 3/17 10.Acute on chronic systolic congestive heart failure.   Demadex 80 mg twice a day---resumed 3/14 by cardiology    Monitor for any signs of fluid overload Filed Weights   01/16/19 1115 01/17/19 0419  Weight: 112.9 kg 112.6 kg   Controlled on 3/16 11. Tobacco abuse/COPD. Patient on 3 L oxygen prior to admission. Continue low-dose prednisone. Nebulizer treatments every 4 hours as needed 12.  Steroid-induced hyperglycemia on diabetes mellitus.   Levemir increased to 20u on 3/15  NovoLog 4 units 3 times daily started on 3/17  No metformin due to Cr   Level on 3/16, will consider adding mealtime coverage tomorrow if necessary 13. Hyperlipidemia. Crestor 14. Acute on chronic anemia  -s/p 1u PRBC  Hemoglobin 8.4 on 3/15  Continue to monitor 15.  Hypokalemia  Potassium 3.3 on 3/17  Supplement increased on 3/16  Hypomagnesemia-supplement initiated  16.  AKI  Creatinine 1.38 on 3/17  Encourage fluids  Continue to monitor 17.  Leukocytosis- likely steroid-induced  WBCs 13.7 on 3/15  Afebrile  Continue to monitor   LOS: 4 days A  FACE TO FACE EVALUATION WAS PERFORMED  Ankit ALorie Phenix3/17/2020, 7:52 AM

## 2019-01-18 NOTE — Plan of Care (Signed)
  Problem: Consults Goal: RH GENERAL PATIENT EDUCATION Description See Patient Education module for education specifics. Outcome: Progressing Goal: Skin Care Protocol Initiated - if Braden Score 18 or less Description If consults are not indicated, leave blank or document N/A Outcome: Progressing Goal: Nutrition Consult-if indicated Outcome: Progressing Goal: Diabetes Guidelines if Diabetic/Glucose > 140 Description If diabetic or lab glucose is > 140 mg/dl - Initiate Diabetes/Hyperglycemia Guidelines & Document Interventions  Outcome: Progressing   Problem: RH BOWEL ELIMINATION Goal: RH STG MANAGE BOWEL WITH ASSISTANCE Description STG Manage Bowel with min.Assistance.  Outcome: Progressing Goal: RH STG MANAGE BOWEL W/MEDICATION W/ASSISTANCE Description STG Manage Bowel with Medication with min.Assistance.  Outcome: Progressing   Problem: RH BLADDER ELIMINATION Goal: RH STG MANAGE BLADDER WITH ASSISTANCE Description STG Manage Bladder With min.Assistance  Outcome: Progressing Goal: RH STG MANAGE BLADDER WITH MEDICATION WITH ASSISTANCE Description STG Manage Bladder With Medication With min. Assistance.  Outcome: Progressing Goal: RH STG MANAGE BLADDER WITH EQUIPMENT WITH ASSISTANCE Description STG Manage Bladder With Equipment With Assistance Outcome: Progressing   Problem: RH SKIN INTEGRITY Goal: RH STG SKIN FREE OF INFECTION/BREAKDOWN Description With min. assist  Outcome: Progressing Goal: RH STG MAINTAIN SKIN INTEGRITY WITH ASSISTANCE Description STG Maintain Skin Integrity With min.Assistance.  Outcome: Progressing Goal: RH STG ABLE TO PERFORM INCISION/WOUND CARE W/ASSISTANCE Description STG Able To Perform Incision/Wound Care With min.Assistance.  Outcome: Progressing   Problem: RH SAFETY Goal: RH STG ADHERE TO SAFETY PRECAUTIONS W/ASSISTANCE/DEVICE Description STG Adhere to Safety Precautions With min.Assistance/Device.  Outcome: Progressing Goal: RH  STG DECREASED RISK OF FALL WITH ASSISTANCE Description STG Decreased Risk of Fall With min.Assistance.  Outcome: Progressing   Problem: RH PAIN MANAGEMENT Goal: RH STG PAIN MANAGED AT OR BELOW PT'S PAIN GOAL Description Less than 3  Outcome: Progressing   Problem: RH KNOWLEDGE DEFICIT GENERAL Goal: RH STG INCREASE KNOWLEDGE OF SELF CARE AFTER HOSPITALIZATION Outcome: Progressing

## 2019-01-19 ENCOUNTER — Inpatient Hospital Stay (HOSPITAL_COMMUNITY): Payer: Medicare Other

## 2019-01-19 DIAGNOSIS — M1611 Unilateral primary osteoarthritis, right hip: Secondary | ICD-10-CM

## 2019-01-19 LAB — BASIC METABOLIC PANEL
Anion gap: 9 (ref 5–15)
BUN: 31 mg/dL — ABNORMAL HIGH (ref 8–23)
CO2: 37 mmol/L — ABNORMAL HIGH (ref 22–32)
Calcium: 8.4 mg/dL — ABNORMAL LOW (ref 8.9–10.3)
Chloride: 94 mmol/L — ABNORMAL LOW (ref 98–111)
Creatinine, Ser: 1.31 mg/dL — ABNORMAL HIGH (ref 0.61–1.24)
GFR calc Af Amer: >60 mL/min (ref >60)
GFR calc non Af Amer: 55 mL/min — ABNORMAL LOW (ref >60)
Glucose, Bld: 250 mg/dL — ABNORMAL HIGH (ref 70–99)
Potassium: 3.4 mmol/L — ABNORMAL LOW (ref 3.5–5.1)
Sodium: 140 mmol/L (ref 135–145)

## 2019-01-19 LAB — GLUCOSE, CAPILLARY
Glucose-Capillary: 216 mg/dL — ABNORMAL HIGH (ref 70–99)
Glucose-Capillary: 246 mg/dL — ABNORMAL HIGH (ref 70–99)
Glucose-Capillary: 137 mg/dL — ABNORMAL HIGH (ref 70–99)
Glucose-Capillary: 338 mg/dL — ABNORMAL HIGH (ref 70–99)

## 2019-01-19 MED ORDER — PREDNISONE 2.5 MG PO TABS
2.5000 mg | ORAL_TABLET | Freq: Every day | ORAL | Status: DC
  Administered 2019-01-19 – 2019-01-21 (×3): 2.5 mg via ORAL
  Filled 2019-01-19 (×3): qty 1

## 2019-01-19 MED ORDER — INSULIN DETEMIR 100 UNIT/ML ~~LOC~~ SOLN: 7.0000 [IU] | Freq: Every day | SUBCUTANEOUS | Status: DC

## 2019-01-19 MED ORDER — DICLOFENAC SODIUM 1 % TD GEL
2.0000 g | Freq: Four times a day (QID) | TRANSDERMAL | Status: DC
  Administered 2019-01-19 – 2019-01-23 (×6): 2 g via TOPICAL
  Filled 2019-01-19: qty 100

## 2019-01-19 NOTE — Progress Notes (Signed)
Physical Therapy Session Note  Patient Details  Name: Nicholas Caldwell MRN: [id number] Date of Birth: 1946-08-07  Today's Date: 01/19/2019 PT Individual Time: 5993-5701 PT Individual Time Calculation (min): 75 min   Short Term Goals: Week 1:  PT Short Term Goal 1 (Week 1): Patient to perform bed mobility with MinA on a consistent basis  PT Short Term Goal 2 (Week 1): Patient to be able to perform sit to stand with MinA from elevated surface on consistent basis  PT Short Term Goal 3 (Week 1): Patient to be able to self-propel WC 141f with VC/min guard  PT Short Term Goal 4 (Week 1): Patient to tolerate gait training approximately 285fwith RW and min guard   Skilled Therapeutic Interventions/Progress Updates:    Patient in room in wheelchair with wife present.  Agreeable to PT though reports just took lots of pills that make him dizzy.  RN made aware to attempt to space out from therapy if possible.  Patient propelled about 2013with bilat UE's prior to fatigue.  Kept on 4L portable O2 throughout session.  Assisted to gym and sit to stand to RW S to min guard throughout session with occasional cues for reaching to armrests to sit.  Patient ambulated with RW x 150' with min A on O2 and at times resting elbows on RW flexed posture throughout and heavy UE support on walker.  Reports feels walker doesn't move as well as one at home so placed walker balls on back legs for improved movement of walker.  Demonstrated curb negotiation with RW and pt performed with min A.  Standing balance activities tossing and reaching for horseshoes with 1 vs no UE support and S to minguard A.  Patient propelled partway to dayroom with S x 70', then assisted rest of the way.  Stand step to seat for Nu Step with minguard A with RW.  Performed 5 min on Nu Step LE/UE level 3.  Seated therex to include LAQ 3# on L no weight on R x 10, seated marching x 10 then armchair pushups 2 x 5 reps.  Patient ambulated around obstacles in  dayroom 2 x about 7038minguard A.  Assisted to room in w/c and replaced on wall O2 @ 3 LPM.  Wife in room and alarm belt on call bell in reach.  Therapy Documentation Precautions:  Precautions Precautions: Fall Precaution Comments: 3-4L O2 [place] baseline Restrictions Weight Bearing Restrictions: No Pain: Pain Assessment Pain Score: 8  Pain Type: Acute pain Pain Location: Hip Pain Orientation: Right Pain Descriptors / Indicators: Aching Pain Onset: On-going Pain Intervention(s): RN made aware    Therapy/Group: Individual Therapy  CyReginia NaasCyMagda KielPT 01/19/2019, 10:23 AM

## 2019-01-19 NOTE — Plan of Care (Signed)
  Problem: Consults Goal: RH GENERAL PATIENT EDUCATION Description See Patient Education module for education specifics. Outcome: Progressing Goal: Skin Care Protocol Initiated - if Braden Score 18 or less Description If consults are not indicated, leave blank or document N/A Outcome: Progressing Goal: Nutrition Consult-if indicated Outcome: Progressing Goal: Diabetes Guidelines if Diabetic/Glucose > 140 Description If diabetic or lab glucose is > 140 mg/dl - Initiate Diabetes/Hyperglycemia Guidelines & Document Interventions  Outcome: Progressing   Problem: RH BOWEL ELIMINATION Goal: RH STG MANAGE BOWEL WITH ASSISTANCE Description STG Manage Bowel with min.Assistance.  Outcome: Progressing Goal: RH STG MANAGE BOWEL W/MEDICATION W/ASSISTANCE Description STG Manage Bowel with Medication with min.Assistance.  Outcome: Progressing   Problem: RH BLADDER ELIMINATION Goal: RH STG MANAGE BLADDER WITH ASSISTANCE Description STG Manage Bladder With min.Assistance  Outcome: Progressing Goal: RH STG MANAGE BLADDER WITH MEDICATION WITH ASSISTANCE Description STG Manage Bladder With Medication With min. Assistance.  Outcome: Progressing Goal: RH STG MANAGE BLADDER WITH EQUIPMENT WITH ASSISTANCE Description STG Manage Bladder With Equipment With Assistance Outcome: Progressing   Problem: RH SKIN INTEGRITY Goal: RH STG SKIN FREE OF INFECTION/BREAKDOWN Description With min. assist  Outcome: Progressing Goal: RH STG MAINTAIN SKIN INTEGRITY WITH ASSISTANCE Description STG Maintain Skin Integrity With min.Assistance.  Outcome: Progressing Goal: RH STG ABLE TO PERFORM INCISION/WOUND CARE W/ASSISTANCE Description STG Able To Perform Incision/Wound Care With min.Assistance.  Outcome: Progressing   Problem: RH SAFETY Goal: RH STG ADHERE TO SAFETY PRECAUTIONS W/ASSISTANCE/DEVICE Description STG Adhere to Safety Precautions With min.Assistance/Device.  Outcome: Progressing Goal: RH  STG DECREASED RISK OF FALL WITH ASSISTANCE Description STG Decreased Risk of Fall With min.Assistance.  Outcome: Progressing   Problem: RH PAIN MANAGEMENT Goal: RH STG PAIN MANAGED AT OR BELOW PT'S PAIN GOAL Description Less than 3  Outcome: Progressing   Problem: RH KNOWLEDGE DEFICIT GENERAL Goal: RH STG INCREASE KNOWLEDGE OF SELF CARE AFTER HOSPITALIZATION Outcome: Progressing   

## 2019-01-19 NOTE — Progress Notes (Signed)
Occupational Therapy Session Note  Patient Details  Name: Nicholas Caldwell MRN: [id number] Date of Birth: November 09, 1945  Today's Date: 01/19/2019 OT Individual Time: 1400-1500 OT Individual Time Calculation (min): 60 min    Short Term Goals: Week 1:  OT Short Term Goal 1 (Week 1): Pt will transfer to BSC/toilet with MIN A and LRAD OT Short Term Goal 2 (Week 1): Pt will thread BLE into pants wiht AE PRN and mod VC OT Short Term Goal 3 (Week 1): Pt will don socks with AE PRN and min VC for technique OT Short Term Goal 4 (Week 1): Pt will advance pants past hips in standing wiht MIN A  Skilled Therapeutic Interventions/Progress Updates:    Pt received sitting up in w/c with no c/o pain, requesting to use bathroom. Pt used RW to complete functional mobility into bathroom with CGA on 3L [place]. Pt voided urine and completed clothing management/hygiene with CGA. Pt completed hand hygiene in standing with CGA. Pt completed w/c propulsion 120 ft with 2 rest breaks, overall (S) level. Pt completed sit <> stands holding 1 lb medicine ball initially with CGA with raised mat, min A when mat was lowered. Pt c/o pain in R hip 9/10 following and was provided extended rest break. Pt's Spo2 remained > 95% throughout session despite SOB. Pt then held 4 lb medicine ball and completed B UE circuit focused on strengthening/endurance. Pt was returned to room and left sitting up in w/c with chair alarm belt fastened.   Therapy Documentation Precautions:  Precautions Precautions: Fall Precaution Comments: 3-4L O2 [place] baseline Restrictions Weight Bearing Restrictions: No   Therapy/Group: Individual Therapy  Curtis Sites 01/19/2019, 1:58 PM

## 2019-01-19 NOTE — Progress Notes (Addendum)
[place] PHYSICAL MEDICINE & REHABILITATION PROGRESS NOTE   Subjective/Complaints: Patient seen sitting up in his chair working with therapies this morning.  He states he slept well overnight.  He states he is doing well except for some chronic left hip pain, for which he states he needed surgery..  ROS: Denies CP, SOB, N/V/D  Objective:   No results found. No results for input(s): WBC, HGB, HCT, PLT in the last 72 hours. Recent Labs    01/18/19 0513 01/19/19 0450  NA 143 140  K 3.3* 3.4*  CL 95* 94*  CO2 35* 37*  GLUCOSE 228* 250*  BUN 34* 31*  CREATININE 1.38* 1.31*  CALCIUM 8.6* 8.4*    Intake/Output Summary (Last 24 hours) at 01/19/2019 0859 Last data filed at 01/19/2019 0800 Gross per 24 hour  Intake 1128 ml  Output 1500 ml  Net -372 ml     Physical Exam: Vital Signs Blood pressure 135/85, pulse 79, temperature 97.8 F (36.6 C), temperature source Oral, resp. rate 19, weight 118.2 kg, SpO2 100 %. Constitutional: No distress . Vital signs reviewed. HENT: Normocephalic.  Atraumatic. Eyes: EOMI. No discharge. Cardiovascular: RRR.  No JVD. Respiratory: CTA bilaterally.  Normal effort.  + [place]. GI: BS +. Non-distended. Musc: TTP right trochanteric bursa. Neurological: Alert and oriented.   Motor: 4-/5 grossly throughout Psych: Normal mood.  Normal behavior.  Assessment/Plan: 1. Functional deficits secondary to debility/encephalopathy which require 3+ hours per day of interdisciplinary therapy in a comprehensive inpatient rehab setting.  Physiatrist is providing close team supervision and 24 hour management of active medical problems listed below.  Physiatrist and rehab team continue to assess barriers to discharge/monitor patient progress toward functional and medical goals  Care Tool:  Bathing    Body parts bathed by patient: Right arm, Left arm, Chest, Abdomen, Right upper leg, Left upper leg, Face   Body parts bathed by helper: Front perineal area,  Buttocks, Right lower leg, Left lower leg     Bathing assist Assist Level: Minimal Assistance - Patient > 75%     Upper Body Dressing/Undressing Upper body dressing   What is the patient wearing?: Hospital gown only, Pull over shirt    Upper body assist Assist Level: Minimal Assistance - Patient > 75%    Lower Body Dressing/Undressing Lower body dressing      What is the patient wearing?: Pants, Underwear/pull up     Lower body assist Assist for lower body dressing: Maximal Assistance - Patient 25 - 49%     Toileting Toileting    Toileting assist Assist for toileting: Minimal Assistance - Patient > 75%     Transfers Chair/bed transfer  Transfers assist  Chair/bed transfer activity did not occur: Safety/medical concerns  Chair/bed transfer assist level: Contact Guard/Touching assist     Locomotion Ambulation   Ambulation assist      Assist level: Contact Guard/Touching assist Assistive device: Walker-rolling Max distance: 100'   Walk 10 feet activity   Assist     Assist level: Contact Guard/Touching assist Assistive device: Walker-rolling   Walk 50 feet activity   Assist Walk 50 feet with 2 turns activity did not occur: Safety/medical concerns(fatigue )  Assist level: Contact Guard/Touching assist Assistive device: Walker-rolling    Walk 150 feet activity   Assist Walk 150 feet activity did not occur: Safety/medical concerns(fatigue )         Walk 10 feet on uneven surface  activity   Assist Walk 10 feet on uneven surfaces activity  did not occur: Safety/medical concerns(fatigue )         Wheelchair     Assist Will patient use wheelchair at discharge?: Yes Type of Wheelchair: Manual(TBD )    Wheelchair assist level: Minimal Assistance - Patient > 75% Max wheelchair distance: 19f    Wheelchair 50 feet with 2 turns activity    Assist        Assist Level: Minimal Assistance - Patient > 75%   Wheelchair 150  feet activity     Assist Wheelchair 150 feet activity did not occur: Safety/medical concerns(fatigue )        Medical Problem List and Plan: 1.Decreased functional mobilitysecondary to acute on chronic hypoxic respiratory failure complicatedby toxicencephalopathy   Continue CIR  Continue steroid taper, decreased again on 3/18  Team conference today to discuss current and goals and coordination of care, home and environmental barriers, and discharge planning with nursing, case manager, and therapies.  2. Antithrombotics: -DVT/anticoagulation:Xarelto -antiplatelet therapy: n/a 3. Pain Management:Neurontin 600 mg twice a day, oxycodone as needed 4. Mood:Provide emotional support -antipsychotic agents: None 5. Neuropsych: This patientiscapable of making decisions on hisown behalf. 6. Skin/Wound Care:Routine skin checks 7. Fluids/Electrolytes/Nutrition:Routine and out's 8. PAF. Cardiac rate controlled. Follow cardiology services. Continue Xarelto 9. Hypertension. Zebeta 2.5 mg daily,Bidil 20-37.5 mg 3 times a day  Relatively controlled on 3/18 10.Acute on chronic systolic congestive heart failure.   Demadex 80 mg twice a day---resumed 3/14 by cardiology    Monitor for any signs of fluid overload Filed Weights   01/16/19 1115 01/17/19 0419 01/19/19 0421  Weight: 112.9 kg 112.6 kg 118.2 kg   ?  Reliability on 3/18 11. Tobacco abuse/COPD. Patient on 3 L oxygen prior to admission. Continue low-dose prednisone. Nebulizer treatments every 4 hours as needed 12.  Steroid-induced hyperglycemia on diabetes mellitus.   Levemir increased to 20u on 3/15  NovoLog 4 units 3 times daily started on 3/17  No metformin due to Cr   Remains elevated, monitor with tapering steroids 13. Hyperlipidemia. Crestor 14. Acute on chronic anemia  Hemoglobin 8.4 on 3/15  Labs ordered for tomorrow  Continue to monitor 15.  Hypokalemia  Potassium 3.4 on  3/18  Supplement increased on 3/16  Hypomagnesemia-supplement initiated  16.  AKI  Creatinine 1.31 on 3/18  Encourage fluids  Continue to monitor 17.  Leukocytosis- likely steroid-induced  WBCs 13.7 on 3/15  Labs ordered for tomorrow  Afebrile  Continue to monitor  18. Right hip OA- chronic, requiring surgery pt pt  Voltaren gel ordered for some tenderness along trochanteric bursa  Will consider steroid injection if necessary  LOS: 5 days A FACE TO FACE EVALUATION WAS PERFORMED  Ankit ALorie Phenix3/18/2020, 8:59 AM

## 2019-01-19 NOTE — Progress Notes (Signed)
Occupational Therapy Session Note  Patient Details  Name: Nicholas Caldwell MRN: [id number] Date of Birth: [birth year]  Today's Date: 01/19/2019 OT Individual Time: 5909-3112 OT Individual Time Calculation (min): 72 min    Short Term Goals: Week 1:  OT Short Term Goal 1 (Week 1): Pt will transfer to BSC/toilet with MIN A and LRAD OT Short Term Goal 2 (Week 1): Pt will thread BLE into pants wiht AE PRN and mod VC OT Short Term Goal 3 (Week 1): Pt will don socks with AE PRN and min VC for technique OT Short Term Goal 4 (Week 1): Pt will advance pants past hips in standing wiht MIN A  Skilled Therapeutic Interventions/Progress Updates:    1:1. Pt received in bed with no pain. Pt requesting to shower. Pt completes all transfers at ambulatory level from elevated surfaces with bed rails/grab bar use with VC for placement of hands on RW EOB>w/c>TTB>BSC>w/c. Pt completes bathing sit to stand from TTB with A to wash B feet as no LHSS available. Pt completes dressing with CGA for advancing pants past hips and A to don teds only. Pt completes oral care seated from w/c at sink for energy conservation. Pt propels w/c to tx gym with supervision overall and 5 rest breaks. O2 >95% throughout session. Pt stands at highlow table to complete pipe tree activity to improve standing tolerance with supervision and 1 aseated rest break. Eited session with pt seated in w/c, call light in reach and all needs met  Therapy Documentation Precautions:  Precautions Precautions: Fall Precaution Comments: 3-4L O2 [place] baseline Restrictions Weight Bearing Restrictions: No General:   Vital Signs:   Pain:  Therapy/Group: Individual Therapy  Tonny Branch 01/19/2019, 7:48 AM

## 2019-01-20 ENCOUNTER — Inpatient Hospital Stay (HOSPITAL_COMMUNITY): Payer: Medicare Other | Admitting: Occupational Therapy

## 2019-01-20 ENCOUNTER — Inpatient Hospital Stay (HOSPITAL_COMMUNITY): Payer: Medicare Other | Admitting: Physical Therapy

## 2019-01-20 LAB — GLUCOSE, CAPILLARY
GLUCOSE-CAPILLARY: 223 mg/dL — AB (ref 70–99)
GLUCOSE-CAPILLARY: 244 mg/dL — AB (ref 70–99)
Glucose-Capillary: 210 mg/dL — ABNORMAL HIGH (ref 70–99)
Glucose-Capillary: 179 mg/dL — ABNORMAL HIGH (ref 70–99)

## 2019-01-20 LAB — CBC WITH DIFFERENTIAL/PLATELET
Abs Immature Granulocytes: 0.08 10*3/uL — ABNORMAL HIGH (ref 0.00–0.07)
Basophils Absolute: 0 10*3/uL (ref 0.0–0.1)
Basophils Relative: 0 %
Eosinophils Absolute: 0.3 10*3/uL (ref 0.0–0.5)
Eosinophils Relative: 3 %
HCT: 26 % — ABNORMAL LOW (ref 39.0–52.0)
Hemoglobin: 7.9 g/dL — ABNORMAL LOW (ref 13.0–17.0)
IMMATURE GRANULOCYTES: 1 %
LYMPHS PCT: 14 %
Lymphs Abs: 1.4 10*3/uL (ref 0.7–4.0)
MCH: 27 pg (ref 26.0–34.0)
MCHC: 30.4 g/dL (ref 30.0–36.0)
MCV: 88.7 fL (ref 80.0–100.0)
Monocytes Absolute: 0.7 10*3/uL (ref 0.1–1.0)
Monocytes Relative: 7 %
Neutro Abs: 7.4 10*3/uL (ref 1.7–7.7)
Neutrophils Relative %: 75 %
Platelets: 337 10*3/uL (ref 150–400)
RBC: 2.93 MIL/uL — ABNORMAL LOW (ref 4.22–5.81)
RDW: 19.6 % — ABNORMAL HIGH (ref 11.5–15.5)
WBC: 9.9 10*3/uL (ref 4.0–10.5)
nRBC: 1 % — ABNORMAL HIGH (ref 0.0–0.2)

## 2019-01-20 LAB — BASIC METABOLIC PANEL
Anion gap: 12 (ref 5–15)
BUN: 26 mg/dL — ABNORMAL HIGH (ref 8–23)
CHLORIDE: 94 mmol/L — AB (ref 98–111)
CO2: 32 mmol/L (ref 22–32)
Calcium: 8.4 mg/dL — ABNORMAL LOW (ref 8.9–10.3)
Creatinine, Ser: 1.2 mg/dL (ref 0.61–1.24)
GFR calc Af Amer: >60 mL/min (ref >60)
GFR calc non Af Amer: >60 mL/min (ref >60)
Glucose, Bld: 185 mg/dL — ABNORMAL HIGH (ref 70–99)
POTASSIUM: 3.6 mmol/L (ref 3.5–5.1)
Sodium: 138 mmol/L (ref 135–145)

## 2019-01-20 NOTE — Progress Notes (Signed)
Physical Therapy Session Note  Patient Details  Name: Nicholas Caldwell MRN: [id number] Date of Birth: 1946-01-04  Today's Date: 01/20/2019 PT Individual Time: 7824-2353 AND 1310-1400 PT Individual Time Calculation (min): 56 min AND 50 min  Short Term Goals: Week 1:  PT Short Term Goal 1 (Week 1): Patient to perform bed mobility with MinA on a consistent basis  PT Short Term Goal 2 (Week 1): Patient to be able to perform sit to stand with MinA from elevated surface on consistent basis  PT Short Term Goal 3 (Week 1): Patient to be able to self-propel WC 130f with VC/min guard  PT Short Term Goal 4 (Week 1): Patient to tolerate gait training approximately 249fwith RW and min guard   Skilled Therapeutic Interventions/Progress Updates:   Session 1:  Pt in supine and agreeable to therapy, pain 8/10 in R hip but declines any intervention. Requesting to toilet. Ambulated to/from toilet w/ close supervision using RW, pt w/ continent void. Set-up assist to don shirt, min assist to thread pants, and total assist to don TEDs and socks. Pt self-propelled w/c to therapy gym in 20-30' bouts w/ brief rest 2/2 increased work of breathing. Ambulated 125' x2 w/ RW and close supervision, verbal and tactile cues for upright posture and RW management. Increased work of breathing, resolves w/ prolonged rest. Needed prolonged rest in between ADL tasks as well 2/2 increased work of breathing. Pt on 4 L/min O2 throughout session, resting and mobility vitals listed below. Returned to room total assist via w/c, ended session in w/c and all needs met.   Vitals @ rest: 83 bpm 100% O2  Vitals after mobility: 97 bpm 95% O2  Session 2:  Pt in w/c and agreeable to therapy, no c/o pain. Missed 10 min of skilled PT at beginning of session 2/2 lunch tray having just arrived. Pt self-propelled w/c 100' towards therapy gym w/ supervision using BUEs to work on endurance. Total assist remainder of way for time management. Pt on 4  L/min O2 while performing w/c mobility this session, pt denied SOB and increased work of breathing returned to baseline w/ brief rest periods this afternoon. NuStep 5 min x2 @ level 4 for global strengthneing and endurance training. O2 down to 88-90% on 4 L while on NuStep, verbal cues for breathing strategies. Returned to room total assist in w/c and ended session in w/c, all needs in reach.    Therapy Documentation Precautions:  Precautions Precautions: Fall Precaution Comments: 3-4L O2 [place] baseline Restrictions Weight Bearing Restrictions: No Vital Signs:  Pain: Pain Assessment Pain Scale: 0-10 Pain Score: 0-No pain  Therapy/Group: Individual Therapy  [name] K Clent Demark/19/2020, 8:58 AM

## 2019-01-20 NOTE — Progress Notes (Signed)
Inpatient Diabetes Program Recommendations  AACE/ADA: New Consensus Statement on Inpatient Glycemic Control  Target Ranges:  Prepandial:   less than 140 mg/dL      Peak postprandial:   less than 180 mg/dL (1-2 hours)      Critically ill patients:  140 - 180 mg/dL   Results for ROMIN, DIVITA (MRN [id number]) as of 01/20/2019 13:32  Ref. Range 01/19/2019 06:21 01/19/2019 11:17 01/19/2019 16:45 01/19/2019 21:23 01/20/2019 06:29 01/20/2019 12:15  Glucose-Capillary Latest Ref Range: 70 - 99 mg/dL 216 (H)  Novolog 9 units  Lantus 20 units 246 (H)  Novolog 9 units 137 (H)  Novolog 4 units 338 (H)  Novolog 4 units 223 (H)  Novolog 5 units  Lantus 20 units 244 (H)  Novolog 9 units   Review of Glycemic Control  Diabetes history: DM2 Outpatient Diabetes medications: 70/30 30 units BID, Metformin XR 1000 mg BID Current orders for Inpatient glycemic control: Levemir 20 units daily, Novolog 0-15 units TID with meals, Novolog 0-5 units QHS, Novolog 4 units TID with meals; Prednisone 2.5 mg QAM  Inpatient Diabetes Program Recommendations:  Insulin - Meal Coverage: If post prandial glucose continues to be consistently greater than 180 mg/dl, may want to consider increasing meal coverage to Novolog 6-7 units TID with meals.  Thanks, Barnie Alderman, RN, MSN, CDE Diabetes Coordinator Inpatient Diabetes Program [phone] (Team Pager from 8am to 5pm)

## 2019-01-20 NOTE — Plan of Care (Signed)
  Problem: Consults Goal: RH GENERAL PATIENT EDUCATION Description See Patient Education module for education specifics. Outcome: Progressing Goal: Skin Care Protocol Initiated - if Braden Score 18 or less Description If consults are not indicated, leave blank or document N/A Outcome: Progressing Goal: Nutrition Consult-if indicated Outcome: Progressing Goal: Diabetes Guidelines if Diabetic/Glucose > 140 Description If diabetic or lab glucose is > 140 mg/dl - Initiate Diabetes/Hyperglycemia Guidelines & Document Interventions  Outcome: Progressing   Problem: RH BOWEL ELIMINATION Goal: RH STG MANAGE BOWEL WITH ASSISTANCE Description STG Manage Bowel with min.Assistance.  Outcome: Progressing Goal: RH STG MANAGE BOWEL W/MEDICATION W/ASSISTANCE Description STG Manage Bowel with Medication with min.Assistance.  Outcome: Progressing   Problem: RH BLADDER ELIMINATION Goal: RH STG MANAGE BLADDER WITH ASSISTANCE Description STG Manage Bladder With min.Assistance  Outcome: Progressing Goal: RH STG MANAGE BLADDER WITH MEDICATION WITH ASSISTANCE Description STG Manage Bladder With Medication With min. Assistance.  Outcome: Progressing Goal: RH STG MANAGE BLADDER WITH EQUIPMENT WITH ASSISTANCE Description STG Manage Bladder With Equipment With Assistance Outcome: Progressing   Problem: RH SKIN INTEGRITY Goal: RH STG SKIN FREE OF INFECTION/BREAKDOWN Description With min. assist  Outcome: Progressing Goal: RH STG MAINTAIN SKIN INTEGRITY WITH ASSISTANCE Description STG Maintain Skin Integrity With min.Assistance.  Outcome: Progressing Goal: RH STG ABLE TO PERFORM INCISION/WOUND CARE W/ASSISTANCE Description STG Able To Perform Incision/Wound Care With min.Assistance.  Outcome: Progressing   Problem: RH SAFETY Goal: RH STG ADHERE TO SAFETY PRECAUTIONS W/ASSISTANCE/DEVICE Description STG Adhere to Safety Precautions With min.Assistance/Device.  Outcome: Progressing Goal: RH  STG DECREASED RISK OF FALL WITH ASSISTANCE Description STG Decreased Risk of Fall With min.Assistance.  Outcome: Progressing   Problem: RH PAIN MANAGEMENT Goal: RH STG PAIN MANAGED AT OR BELOW PT'S PAIN GOAL Description Less than 3  Outcome: Progressing   Problem: RH KNOWLEDGE DEFICIT GENERAL Goal: RH STG INCREASE KNOWLEDGE OF SELF CARE AFTER HOSPITALIZATION Outcome: Progressing

## 2019-01-20 NOTE — Progress Notes (Signed)
Occupational Therapy Session Note  Patient Details  Name: Nicholas Caldwell MRN: [id number] Date of Birth: 02/04/1946  Today's Date: 01/20/2019 OT Individual Time: 1000-1105 OT Individual Time Calculation (min): 65 min   Short Term Goals: Week 1:  OT Short Term Goal 1 (Week 1): Pt will transfer to BSC/toilet with MIN A and LRAD OT Short Term Goal 2 (Week 1): Pt will thread BLE into pants wiht AE PRN and mod VC OT Short Term Goal 3 (Week 1): Pt will don socks with AE PRN and min VC for technique OT Short Term Goal 4 (Week 1): Pt will advance pants past hips in standing wiht MIN A  Skilled Therapeutic Interventions/Progress Updates:    Pt greeted sitting in wc and agreeable to OT treatment session. Pt requests to shower today. Pt on 3L throughout shower with O2 maintained about 90%. Pt ambulated into bathroom and onto tub bench with RW and CGA. Pt needed assistance to doff TED hose and socks today. Bathing completed with min A to wash feet and bottom. OT described use of LH sponge to increase access to LB, however, LH sponges unavailable at this time. Pt completed dressing from [facility]. Pt able to thread pant legs without reacher, min A sit<>stand, and CGA for balance when pulling up pants. OT educated on use of friction reducing bag to don TED hose, but still needed OT assist. Pt demonstrated understanding of use of sock-aid and was able to don socks with set-up A. Pt ambulated out of bathroom back to wc in similar fashion. Worked on LB strength and endurance with 3 sets of 5 sit<>stands. Pt needed extended rest breaks in between sets. SpO2 85% on 3L with there-ex. O2 bumped to 4 for activity and verbal cues for deep breathing techniques. SpO2 maintained at 90 with 4L and activity. Pt reported increased hip pain and nursing notified for pain medicine. Pt left seated in wc with alarm pad on and needs met.   Therapy Documentation Precautions:  Precautions Precautions: Fall Precaution Comments: 3-4L O2 [place]  baseline Restrictions Weight Bearing Restrictions: No Pain: Pain Assessment Pain Scale: 0-10 Pain Score: 8  Pain Type: Chronic pain Pain Location: Hip Pain Orientation: Right Pain Descriptors / Indicators: Aching;Constant Pain Frequency: Constant Pain Onset: On-going Patients Stated Pain Goal: 3 Pain Intervention(s): Repositioned; nursing notified Multiple Pain Sites: No   Therapy/Group: Individual Therapy  Valma Cava 01/20/2019, 11:05 AM

## 2019-01-20 NOTE — Progress Notes (Signed)
[place] PHYSICAL MEDICINE & REHABILITATION PROGRESS NOTE   Subjective/Complaints: No new issues. Had a good night  ROS: Patient denies fever, rash, sore throat, blurred vision, nausea, vomiting, diarrhea, cough, shortness of breath or chest pain, joint or back pain, headache, or mood change.     Objective:   No results found. Recent Labs    01/20/19 0523  WBC 9.9  HGB 7.9*  HCT 26.0*  PLT 337   Recent Labs    01/19/19 0450 01/20/19 0523  NA 140 138  K 3.4* 3.6  CL 94* 94*  CO2 37* 32  GLUCOSE 250* 185*  BUN 31* 26*  CREATININE 1.31* 1.20  CALCIUM 8.4* 8.4*    Intake/Output Summary (Last 24 hours) at 01/20/2019 0855 Last data filed at 01/20/2019 0800 Gross per 24 hour  Intake 1146 ml  Output 1800 ml  Net -654 ml     Physical Exam: Vital Signs Blood pressure (!) 100/57, pulse 74, temperature 97.9 F (36.6 C), temperature source Oral, resp. rate 17, weight 115.4 kg, SpO2 97 %. Constitutional: No distress . Vital signs reviewed. HEENT: EOMI, oral membranes moist Neck: supple Cardiovascular: RRR without murmur. No JVD    Respiratory: CTA Bilaterally without wheezes or rales. Normal effort    GI: BS +, non-tender, non-distended  Musc: TTP right trochanteric bursa still.  Neurological: Alert and oriented.     Motor: 4-/5 grossly throughout--unchanged Psych: Normal mood.  Normal behavior.  Assessment/Plan: 1. Functional deficits secondary to debility/encephalopathy which require 3+ hours per day of interdisciplinary therapy in a comprehensive inpatient rehab setting.  Physiatrist is providing close team supervision and 24 hour management of active medical problems listed below.  Physiatrist and rehab team continue to assess barriers to discharge/monitor patient progress toward functional and medical goals  Care Tool:  Bathing    Body parts bathed by patient: Right arm, Left arm, Chest, Abdomen, Right upper leg, Left upper leg, Face   Body parts bathed  by helper: Front perineal area, Buttocks, Right lower leg, Left lower leg     Bathing assist Assist Level: Minimal Assistance - Patient > 75%     Upper Body Dressing/Undressing Upper body dressing   What is the patient wearing?: Hospital gown only, Pull over shirt    Upper body assist Assist Level: Minimal Assistance - Patient > 75%    Lower Body Dressing/Undressing Lower body dressing      What is the patient wearing?: Pants, Underwear/pull up     Lower body assist Assist for lower body dressing: Maximal Assistance - Patient 25 - 49%     Toileting Toileting    Toileting assist Assist for toileting: Minimal Assistance - Patient > 75%     Transfers Chair/bed transfer  Transfers assist  Chair/bed transfer activity did not occur: Safety/medical concerns  Chair/bed transfer assist level: Contact Guard/Touching assist     Locomotion Ambulation   Ambulation assist      Assist level: Contact Guard/Touching assist Assistive device: Walker-rolling Max distance: 150'   Walk 10 feet activity   Assist     Assist level: Contact Guard/Touching assist Assistive device: Walker-rolling   Walk 50 feet activity   Assist Walk 50 feet with 2 turns activity did not occur: Safety/medical concerns(fatigue )  Assist level: Contact Guard/Touching assist Assistive device: Walker-rolling    Walk 150 feet activity   Assist Walk 150 feet activity did not occur: Safety/medical concerns(fatigue )  Assist level: Contact Guard/Touching assist Assistive device: Walker-rolling    Walk  10 feet on uneven surface  activity   Assist Walk 10 feet on uneven surfaces activity did not occur: Safety/medical concerns(fatigue )         Wheelchair     Assist Will patient use wheelchair at discharge?: Yes Type of Wheelchair: Manual    Wheelchair assist level: Supervision/Verbal cueing Max wheelchair distance: 46'    Wheelchair 50 feet with 2 turns  activity    Assist        Assist Level: Supervision/Verbal cueing   Wheelchair 150 feet activity     Assist Wheelchair 150 feet activity did not occur: Safety/medical concerns(fatigue )        Medical Problem List and Plan: 1.Decreased functional mobilitysecondary to acute on chronic hypoxic respiratory failure complicatedby toxicencephalopathy   -Continue CIR therapies including PT, OT   Continue steroid taper, decreased again on 3/18    2. Antithrombotics: -DVT/anticoagulation:Xarelto -antiplatelet therapy: n/a 3. Pain Management:Neurontin 600 mg twice a day, oxycodone as needed 4. Mood:Provide emotional support -antipsychotic agents: None 5. Neuropsych: This patientiscapable of making decisions on hisown behalf. 6. Skin/Wound Care:Routine skin checks 7. Fluids/Electrolytes/Nutrition:Routine and out's 8. PAF. Cardiac rate controlled. Follow cardiology services. Continue Xarelto 9. Hypertension. Zebeta 2.5 mg daily,Bidil 20-37.5 mg 3 times a day  Relatively controlled on 3/19 10.Acute on chronic systolic congestive heart failure.   Demadex 80 mg twice a day---resumed 3/14 by cardiology    Monitor for any signs of fluid overload Filed Weights   01/19/19 0421 01/20/19 0444 01/20/19 0452  Weight: 118.2 kg 118.2 kg 115.4 kg   Inconsistent weights---118 baseline? 11. Tobacco abuse/COPD. Patient on 3 L oxygen prior to admission. Continue low-dose prednisone. Nebulizer treatments every 4 hours as needed 12.  Steroid-induced hyperglycemia on diabetes mellitus.   -cbg's remain labile  Levemir increased to 20u on 3/15  NovoLog 4 units 3 times daily started on 3/17  No metformin due to Cr   Will not make changes today as we are tapering steroids 13. Hyperlipidemia. Crestor 14. Acute on chronic anemia  Hemoglobin 8.4 on 3/15--> 7.9 today but has ranged from 7.3 to 8.4 recently   -no gross bleeding, normal MCV  -continue to  monitor, check stool for OB 15.  Hypokalemia  Potassium 3.4 on 3/18---3.6 3/19  Supplement increased on 3/16  Hypomagnesemia-supplement initiated  16.  AKI  Creatinine 1.31 on 3/18---1.2 3/19  Encourage fluids  Continue to monitor 17.  Leukocytosis- likely steroid-induced  WBCs 13.7 on 3/15---down to 9.9 today 3/19   Afebrile  Continue to monitor  18. Right hip OA- chronic, requiring surgery pt pt  Voltaren gel ordered for some tenderness along trochanteric bursa  Pt reports improved pain today  LOS: 6 days A FACE TO FACE EVALUATION WAS PERFORMED  Meredith Staggers 01/20/2019, 8:55 AM

## 2019-01-21 ENCOUNTER — Inpatient Hospital Stay (HOSPITAL_COMMUNITY): Payer: Medicare Other | Admitting: Occupational Therapy

## 2019-01-21 ENCOUNTER — Inpatient Hospital Stay (HOSPITAL_COMMUNITY): Payer: Medicare Other | Admitting: Physical Therapy

## 2019-01-21 LAB — GLUCOSE, CAPILLARY
GLUCOSE-CAPILLARY: 176 mg/dL — AB (ref 70–99)
Glucose-Capillary: 220 mg/dL — ABNORMAL HIGH (ref 70–99)
Glucose-Capillary: 222 mg/dL — ABNORMAL HIGH (ref 70–99)
Glucose-Capillary: 205 mg/dL — ABNORMAL HIGH (ref 70–99)

## 2019-01-21 LAB — BASIC METABOLIC PANEL
Anion gap: 9 (ref 5–15)
BUN: 24 mg/dL — ABNORMAL HIGH (ref 8–23)
CO2: 36 mmol/L — ABNORMAL HIGH (ref 22–32)
Calcium: 8.9 mg/dL (ref 8.9–10.3)
Chloride: 95 mmol/L — ABNORMAL LOW (ref 98–111)
Creatinine, Ser: 1.3 mg/dL — ABNORMAL HIGH (ref 0.61–1.24)
GFR calc Af Amer: >60 mL/min (ref >60)
GFR, EST NON AFRICAN AMERICAN: 55 mL/min — AB (ref >60)
Glucose, Bld: 190 mg/dL — ABNORMAL HIGH (ref 70–99)
POTASSIUM: 4.1 mmol/L (ref 3.5–5.1)
Sodium: 140 mmol/L (ref 135–145)

## 2019-01-21 LAB — CBC
HCT: 26.9 % — ABNORMAL LOW (ref 39.0–52.0)
HEMOGLOBIN: 8.3 g/dL — AB (ref 13.0–17.0)
MCH: 27.5 pg (ref 26.0–34.0)
MCHC: 30.9 g/dL (ref 30.0–36.0)
MCV: 89.1 fL (ref 80.0–100.0)
Platelets: 346 10*3/uL (ref 150–400)
RBC: 3.02 MIL/uL — ABNORMAL LOW (ref 4.22–5.81)
RDW: 19.5 % — ABNORMAL HIGH (ref 11.5–15.5)
WBC: 9.5 10*3/uL (ref 4.0–10.5)
nRBC: 0.7 % — ABNORMAL HIGH (ref 0.0–0.2)

## 2019-01-21 MED ORDER — FLEET ENEMA 7-19 GM/118ML RE ENEM
1.0000 | ENEMA | Freq: Every day | RECTAL | Status: DC | PRN
  Administered 2019-01-21: 1 via RECTAL
  Filled 2019-01-21: qty 1

## 2019-01-21 MED ORDER — POLYETHYLENE GLYCOL 3350 17 G PO PACK
17.0000 g | PACK | Freq: Every day | ORAL | Status: DC
  Administered 2019-01-22 – 2019-01-23 (×2): 17 g via ORAL
  Filled 2019-01-21 (×2): qty 1

## 2019-01-21 MED ORDER — POLYETHYLENE GLYCOL 3350 17 G PO PACK
17.0000 g | PACK | Freq: Once | ORAL | Status: AC
  Administered 2019-01-21: 17 g via ORAL
  Filled 2019-01-21: qty 1

## 2019-01-21 MED ORDER — POLYETHYLENE GLYCOL 3350 17 G PO PACK: 17.0000 g | PACK | Freq: Every day | ORAL | Status: DC

## 2019-01-21 MED ORDER — POTASSIUM CHLORIDE CRYS ER 20 MEQ PO TBCR
20.0000 meq | EXTENDED_RELEASE_TABLET | Freq: Every day | ORAL | Status: DC
  Administered 2019-01-22 – 2019-01-24 (×3): 20 meq via ORAL
  Filled 2019-01-21 (×3): qty 1

## 2019-01-21 NOTE — Progress Notes (Signed)
Occupational Therapy Weekly Progress Note  Patient Details  Name: Nicholas Caldwell MRN: [id number] Date of Birth: 21-Jan-1946  Beginning of progress report period: January 15, 2019 End of progress report period: January 21, 2019  Today's Date: 01/21/2019  Session 1 OT Individual Time: 1000-1100 OT Individual Time Calculation (min): 60 min   Session 2 OT Individual Time: 4142-3953 OT Individual Time Calculation (min): 29 min    Patient has met 4 of 4 short term goals.  Pt has made steady progress towards OT goals this week. Pt is able to ambulate in the room to get to the bathroom and shower with min A. He requires min A overall for all of his BADL tasks and has demonstrated improved overall activity tolerance when performing self-care tasks. Feel pt is on target to reach goals for dc next week.   Patient continues to demonstrate the following deficits: muscle weakness, decreased cardiorespiratoy endurance and decreased oxygen support and decreased standing balance and decreased balance strategies and therefore will continue to benefit from skilled OT intervention to enhance overall performance with BADL.  Patient progressing toward long term goals..  Continue plan of care.  OT Short Term Goals Week 1:  OT Short Term Goal 1 (Week 1): Pt will transfer to BSC/toilet with MIN A and LRAD OT Short Term Goal 1 - Progress (Week 1): Met OT Short Term Goal 2 (Week 1): Pt will thread BLE into pants wiht AE PRN and mod VC OT Short Term Goal 2 - Progress (Week 1): Met OT Short Term Goal 3 (Week 1): Pt will don socks with AE PRN and min VC for technique OT Short Term Goal 3 - Progress (Week 1): Met OT Short Term Goal 4 (Week 1): Pt will advance pants past hips in standing wiht MIN A OT Short Term Goal 4 - Progress (Week 1): Met Week 2:  OT Short Term Goal 1 (Week 2): LTG=STG 2/2 ELOS  Skilled Therapeutic Interventions/Progress Updates:  Session 1   Pt greeted sitting in wc and agreeable to OT treatment  session. B UE strength/endurance with wc propulsion to therapy gym. Pt on 3L of O2 throughout session. Pt completed obstacle course 4x focused on RW management around turns of cones. Pt needed seated rest break in between sets. Standing balance/endurance with horse shoe toss activity w/ intermittent CGA for balance. UB strengthening with beach ball chest press activity. Pt returned to room and left seated in wc with chair alarm on and needs met.   Session 2 Pt greeted sitting in wc after returning from the bathroom with nursing staff. Pt received pain medications and OT provided pt with walker bag. OT educated on use of walker bag to increase safety and independence with ADLs and iADLs. Pt then ambulated to therapy gym ~75 feet with RW and 4L of O2. Pt then ambulated 3 sets of 25 feet with extended rest breaks after ambulation. Pt then returned to room ambulating in similar fashion and left seated in wc with chair alarm on and needs met.   Therapy Documentation Precautions:  Precautions Precautions: Fall Precaution Comments: 3-4L O2 [place] baseline Restrictions Weight Bearing Restrictions: No Pain: Pain Assessment Pain Scale: 0-10 Pain Score: 8  Pain Type: Chronic pain Pain Location: Hip Pain Orientation: Right Pain Descriptors / Indicators: Aching Pain Onset: On-going Pain Intervention(s): Repositioned   Therapy/Group: Individual Therapy  Valma Cava 01/21/2019, 2:29 PM

## 2019-01-21 NOTE — Progress Notes (Signed)
[facility] PHYSICAL MEDICINE & REHABILITATION PROGRESS NOTE   Subjective/Complaints: Patient seen sitting up in bed this morning.  He states he slept well overnight.  He states therapies are going well.  He denies complaints.  ROS: Denies CP, SOB, N/V/D   Objective:   No results found. Recent Labs    01/20/19 0523 01/21/19 0541  WBC 9.9 9.5  HGB 7.9* 8.3*  HCT 26.0* 26.9*  PLT 337 346   Recent Labs    01/20/19 0523 01/21/19 0541  NA 138 140  K 3.6 4.1  CL 94* 95*  CO2 32 36*  GLUCOSE 185* 190*  BUN 26* 24*  CREATININE 1.20 1.30*  CALCIUM 8.4* 8.9    Intake/Output Summary (Last 24 hours) at 01/21/2019 6644 Last data filed at 01/21/2019 0120 Gross per 24 hour  Intake 360 ml  Output 1200 ml  Net -840 ml     Physical Exam: Vital Signs Blood pressure 125/75, pulse 77, temperature 97.8 F (36.6 C), temperature source Oral, resp. rate 17, weight 115.1 kg, SpO2 100 %. Constitutional: No distress . Vital signs reviewed. HENT: Normocephalic.  Atraumatic. Eyes: EOMI. No discharge. Cardiovascular: RRR. No JVD. Respiratory: CTA Bilaterally. Normal effort. GI: BS +. Non-distended. Musc: No edema or tenderness in extremities. Musc: No edema or tenderness in extremities Neurological: Alert and oriented.     Motor: 4/5 grossly throughout Psych: Normal mood.  Normal behavior.  Assessment/Plan: 1. Functional deficits secondary to debility/encephalopathy which require 3+ hours per day of interdisciplinary therapy in a comprehensive inpatient rehab setting.  Physiatrist is providing close team supervision and 24 hour management of active medical problems listed below.  Physiatrist and rehab team continue to assess barriers to discharge/monitor patient progress toward functional and medical goals  Care Tool:  Bathing    Body parts bathed by patient: Right arm, Left arm, Chest, Abdomen, Right upper leg, Left upper leg, Face   Body parts bathed by helper: Front perineal  area, Buttocks, Right lower leg, Left lower leg     Bathing assist Assist Level: Minimal Assistance - Patient > 75%     Upper Body Dressing/Undressing Upper body dressing   What is the patient wearing?: Hospital gown only, Pull over shirt    Upper body assist Assist Level: Minimal Assistance - Patient > 75%    Lower Body Dressing/Undressing Lower body dressing      What is the patient wearing?: Pants, Underwear/pull up     Lower body assist Assist for lower body dressing: Maximal Assistance - Patient 25 - 49%     Toileting Toileting    Toileting assist Assist for toileting: Minimal Assistance - Patient > 75%     Transfers Chair/bed transfer  Transfers assist  Chair/bed transfer activity did not occur: Safety/medical concerns  Chair/bed transfer assist level: Contact Guard/Touching assist     Locomotion Ambulation   Ambulation assist      Assist level: Supervision/Verbal cueing Assistive device: Walker-rolling Max distance: 125'   Walk 10 feet activity   Assist     Assist level: Supervision/Verbal cueing Assistive device: Walker-rolling   Walk 50 feet activity   Assist Walk 50 feet with 2 turns activity did not occur: Safety/medical concerns(fatigue )  Assist level: Supervision/Verbal cueing Assistive device: Walker-rolling    Walk 150 feet activity   Assist Walk 150 feet activity did not occur: Safety/medical concerns(fatigue )  Assist level: Contact Guard/Touching assist Assistive device: Walker-rolling    Walk 10 feet on uneven surface  activity  Assist Walk 10 feet on uneven surfaces activity did not occur: Safety/medical concerns(fatigue )         Wheelchair     Assist Will patient use wheelchair at discharge?: Yes Type of Wheelchair: Manual    Wheelchair assist level: Supervision/Verbal cueing Max wheelchair distance: 30'    Wheelchair 50 feet with 2 turns activity    Assist        Assist Level:  Supervision/Verbal cueing   Wheelchair 150 feet activity     Assist Wheelchair 150 feet activity did not occur: Safety/medical concerns(fatigue )        Medical Problem List and Plan: 1.Decreased functional mobilitysecondary to acute on chronic hypoxic respiratory failure complicatedby toxicencephalopathy   Continue CIR  Continue steroid taper, decreased again on 3/18, DC'd on 3/21 2. Antithrombotics: -DVT/anticoagulation:Xarelto -antiplatelet therapy: n/a 3. Pain Management:Neurontin 600 mg twice a day, oxycodone as needed 4. Mood:Provide emotional support -antipsychotic agents: None 5. Neuropsych: This patientiscapable of making decisions on hisown behalf. 6. Skin/Wound Care:Routine skin checks 7. Fluids/Electrolytes/Nutrition:Routine and out's 8. PAF. Cardiac rate controlled. Follow cardiology services. Continue Xarelto 9. Hypertension. Zebeta 2.5 mg daily,Bidil 20-37.5 mg 3 times a day  Controlled on 3/20 10.Acute on chronic systolic congestive heart failure.   Demadex 80 mg twice a day---resumed 3/14 by cardiology    Monitor for any signs of fluid overload Filed Weights   01/20/19 0444 01/20/19 0452 01/21/19 0500  Weight: 118.2 kg 115.4 kg 115.1 kg   Stable on 3/20 11. Tobacco abuse/COPD. Patient on 3 L oxygen prior to admission. Continue low-dose prednisone. Nebulizer treatments every 4 hours as needed 12.  Steroid-induced hyperglycemia on diabetes mellitus.   -cbg's remain labile  Levemir increased to 20u on 3/15  NovoLog 4 units 3 times daily started on 3/17  No metformin due to Cr   Monitor with steroid taper 13. Hyperlipidemia. Crestor 14. Acute on chronic anemia  Hemoglobin 8.3 on 3/20  Continue to monitor 15.  Hypokalemia  Potassium 4.1 on 3/20  Labs ordered for Monday  Supplement increased on 3/16, decreased on 3/20  Hypomagnesemia-supplement initiated  16.  AKI  Creatinine 1.30 on 3/20  Encourage  fluids  Continue to monitor 17.  Leukocytosis- likely steroid-induced: Resolved  WBCs 9.5 on 3/20   Afebrile  Continue to monitor  18. Right hip OA- chronic, requiring surgery pt pt  Voltaren gel ordered for some tenderness along trochanteric bursa  Improving  LOS: 7 days A FACE TO FACE EVALUATION WAS PERFORMED  Ankit Lorie Phenix 01/21/2019, 8:12 AM

## 2019-01-21 NOTE — Plan of Care (Signed)
  Problem: Consults Goal: RH GENERAL PATIENT EDUCATION Description See Patient Education module for education specifics. Outcome: Progressing Goal: Skin Care Protocol Initiated - if Braden Score 18 or less Description If consults are not indicated, leave blank or document N/A Outcome: Progressing Goal: Nutrition Consult-if indicated Outcome: Progressing Goal: Diabetes Guidelines if Diabetic/Glucose > 140 Description If diabetic or lab glucose is > 140 mg/dl - Initiate Diabetes/Hyperglycemia Guidelines & Document Interventions  Outcome: Progressing   Problem: RH BOWEL ELIMINATION Goal: RH STG MANAGE BOWEL WITH ASSISTANCE Description STG Manage Bowel with min.Assistance.  Outcome: Progressing Goal: RH STG MANAGE BOWEL W/MEDICATION W/ASSISTANCE Description STG Manage Bowel with Medication with min.Assistance.  Outcome: Progressing   Problem: RH BLADDER ELIMINATION Goal: RH STG MANAGE BLADDER WITH ASSISTANCE Description STG Manage Bladder With min.Assistance  Outcome: Progressing Goal: RH STG MANAGE BLADDER WITH MEDICATION WITH ASSISTANCE Description STG Manage Bladder With Medication With min. Assistance.  Outcome: Progressing Goal: RH STG MANAGE BLADDER WITH EQUIPMENT WITH ASSISTANCE Description STG Manage Bladder With Equipment With Assistance Outcome: Progressing   Problem: RH SKIN INTEGRITY Goal: RH STG SKIN FREE OF INFECTION/BREAKDOWN Description With min. assist  Outcome: Progressing Goal: RH STG MAINTAIN SKIN INTEGRITY WITH ASSISTANCE Description STG Maintain Skin Integrity With min.Assistance.  Outcome: Progressing Goal: RH STG ABLE TO PERFORM INCISION/WOUND CARE W/ASSISTANCE Description STG Able To Perform Incision/Wound Care With min.Assistance.  Outcome: Progressing   Problem: RH SAFETY Goal: RH STG ADHERE TO SAFETY PRECAUTIONS W/ASSISTANCE/DEVICE Description STG Adhere to Safety Precautions With min.Assistance/Device.  Outcome: Progressing Goal: RH  STG DECREASED RISK OF FALL WITH ASSISTANCE Description STG Decreased Risk of Fall With min.Assistance.  Outcome: Progressing   Problem: RH PAIN MANAGEMENT Goal: RH STG PAIN MANAGED AT OR BELOW PT'S PAIN GOAL Description Less than 3  Outcome: Progressing   Problem: RH KNOWLEDGE DEFICIT GENERAL Goal: RH STG INCREASE KNOWLEDGE OF SELF CARE AFTER HOSPITALIZATION Outcome: Progressing

## 2019-01-21 NOTE — Progress Notes (Signed)
Physical Therapy Weekly Progress Note  Patient Details  Name: Nicholas Caldwell MRN: [id number] Date of Birth: Jan 06, 1946  Beginning of progress report period: January 15, 2019 End of progress report period: January 21, 2019  Today's Date: 01/21/2019 PT Individual Time: 0800-0858 AND 1515-1540 PT Individual Time Calculation (min): 58 min AND 25 min  Patient has met 4 of 4 short term goals. Pt is making good progress towards LTGs, performing most mobility at close supervision-CGA level w/ occasional min assist to stand from a lower and compliant surface. However pt states he was avoiding low surfaces prior to hospitalization. He is ambulating and self-propelling w/c household distances, but continues to require prolonged seated rest breaks w/ all mobility 2/2 moderate increase in work of breathing. He requires 4-6 L/min supplemental O2 during activity to maintain >88% O2 sat.   Patient continues to demonstrate the following deficits muscle weakness and muscle joint tightness, decreased cardiorespiratoy endurance and decreased oxygen support and decreased standing balance and decreased postural control and therefore will continue to benefit from skilled PT intervention to increase functional independence with mobility.  Patient progressing toward long term goals..  Continue plan of care.  PT Short Term Goals Week 1:  PT Short Term Goal 1 (Week 1): Patient to perform bed mobility with MinA on a consistent basis  PT Short Term Goal 1 - Progress (Week 1): Met PT Short Term Goal 2 (Week 1): Patient to be able to perform sit to stand with MinA from elevated surface on consistent basis  PT Short Term Goal 2 - Progress (Week 1): Met PT Short Term Goal 3 (Week 1): Patient to be able to self-propel WC 171f with VC/min guard  PT Short Term Goal 3 - Progress (Week 1): Met PT Short Term Goal 4 (Week 1): Patient to tolerate gait training approximately 234fwith RW and min guard  PT Short Term Goal 4 - Progress  (Week 1): Met Week 2:  PT Short Term Goal 1 (Week 2): =LTGs due to ELOS  Skilled Therapeutic Interventions/Progress Updates:   Session 1: Pt in supine and agreeable to therapy, pain 9/10 in back - RN present providing medication. Supervision bed mobility and set-up assist to don shirt and pants. Ambulated to/from toilet w/ CGA using RW, supervision for pericare and LE garment management. Stood at sink to wash hands prior to needing seated rest break. Session focused on functional endurance. Ambulated to/from therapy gym w/ close supervision, ~100' each way, and 5061within gym. Discussed height of surfaces at home including couch and bed. Pt states he avoids low surfaces at home and has a bed rail to assist w/ supine<>sit transfers in his bed at home. Practiced negotiating curb to simulate home access w/ threshold, negotiated w/ RW and CGA. Prolonged seated rest breaks throughout session 2/2 moderate increase in work of breathing w/ all mobility. Remained on 4 L/min supplemental O2 during activity today. Continuous pulse ox monitoring during 2nd gait bout, did not go below 90%, however needed 2-3 minutes of rest to return to >97%.  Returned to room and ended session in w/c, all needs in reach.   Session 2:  Pt in w/c and agreeable to therapy, reports 10/10 pain in buttocks. Pt unsure if it is related to constipation or his chronic hip pain. Declines therapist asking nurse for pain medicine and wants to try to participate in therapy. Total assist w/c transport to/from therapy gym, CGA stand pivot w/o AD onto NuStep. Pt grimacing w/ all active movements, yet  requesting to continue session. NuStep 5 min @ level 1 w/ all extremities, pt grimacing w/ all movement and moderate increased work of breathing towards end. Pt almost appeared to be hyperventilating 2/2 pain. Educated pt on importance of listening to body cues regarding pain and fatigue level. Pt agreeable to end session early 2/2 pain. Returned to room  via w/c and ambulated to toilet w/ CGA using RW. No increase in pain in standing, it remains unchanged w/ position. Ended session on toilet, NT and RN made aware of status and suspected constipation pain as pt has not had BM since 3/17. Missed 20 min of skilled PT 2/2 pain.  Therapy Documentation Precautions:  Precautions Precautions: Fall Precaution Comments: 3-4L O2  baseline Restrictions Weight Bearing Restrictions: No Vital Signs: Therapy Vitals Temp: 97.8 F (36.6 C) Temp Source: Oral Pulse Rate: 77 Resp: 17 BP: 125/75 Patient Position (if appropriate): Lying Oxygen Therapy SpO2: 100 % O2 Device: Room Air  Therapy/Group: Individual Therapy  Khole Branch K Jette Lewan 01/21/2019, 8:59 AM

## 2019-01-21 NOTE — Progress Notes (Signed)
Social Work Patient ID: Nicholas Caldwell, male   DOB: 17-May-1946, 73 y.o.   MRN: [id number]   Met yesterday and today with pt and wife to review team conference and d/c plans.  Both aware and agreeable with targeted d/c date of 3/25 and supervision goals overall.  Reviewed HH and DME needs.  Pt reports that he wants to make sure he has his CPAP for home and that he completed a sleep study at [facility] on 1/26.   I contacted the [place] today and confirmed that sleep study was completed and plan for pt to p/u CPAP there at time of this hospital d/c.  Have informed pt and wife that they should receive a call from the [place] no later than Tuesday to confirm plan for him to go there on 3/25 once d/c'd.  Continue to follow.  Kiwana Deblasi, LCSW

## 2019-01-21 NOTE — Patient Care Conference (Signed)
Inpatient RehabilitationTeam Conference and Plan of Care Update Date: 01/19/2019   Time: 2:45 PM    Patient Name: Nicholas Caldwell      Medical Record Number: [id number]  Date of Birth: [birth year] Sex: Male         Room/Bed: 4M01C/4M01C-01 Payor Info: Payor: MEDICARE / Plan: MEDICARE PART A AND B / Product Type: *No Product type* /    Admitting Diagnosis: debility  Admit Date/Time:  01/14/2019  6:03 PM Admission Comments: No comment available   Primary Diagnosis:  <principal problem not specified> Principal Problem: <principal problem not specified>  Patient Active Problem List   Diagnosis Date Noted  . Primary osteoarthritis of right hip   . Hypomagnesemia   . Steroid-induced hyperglycemia   . Diabetes mellitus type 2 in nonobese (HCC)   . Supplemental oxygen dependent   . Leukocytosis   . Hypokalemia   . Acute on chronic anemia   . Diabetic peripheral neuropathy ([place])   . Acute on chronic systolic (congestive) heart failure ([place])   . Acute on chronic respiratory failure ([place]) 01/14/2019  . Hypoxia   . Altered mental status   . Heart failure ([place]) 12/30/2018  . AKI (acute kidney injury) ([place])   . Acute respiratory failure ([place]) 11/22/2018  . Acute on chronic respiratory failure with hypoxia and hypercapnia ([place]) 11/13/2018  . Metabolic encephalopathy 93/71/6967  . Acute respiratory failure with hypoxia and hypercapnia ([place]) 07/26/2018  . Acute exacerbation of CHF (congestive heart failure) ([place]) 07/26/2018  . CHF exacerbation ([place]) 05/01/2018  . CHF (congestive heart failure) ([place]) 04/30/2018  . Chronic respiratory failure with hypoxia ([place]) 12/28/2017  . Atrial fibrillation with RVR ([place])   . SVT (supraventricular tachycardia) ([place])   . COPD GOLD 0   . Medically noncompliant   . Panlobular emphysema ([place])   . OSA (obstructive sleep apnea)   . Pulmonary hypertension ([place])   . Disorientation   . Pressure injury of skin 09/07/2016  . Acute on chronic combined systolic and  diastolic CHF (congestive heart failure) ([place]) 09/05/2016  . Skin lesion-left heal 09/05/2016  . Chronic systolic CHF (congestive heart failure) ([place]) 07/16/2016  . COPD (chronic obstructive pulmonary disease) ([place]) 07/16/2016  . Uncontrolled type 2 diabetes mellitus with complication ([place])   . Diabetic polyneuropathy associated with diabetes mellitus due to underlying condition ([place])   . Normocytic anemia 06/29/2016  . CAD - Non-obstructive by LHC1/16 01/21/2016  . Nonischemic cardiomyopathy ([place]) 10/09/2015  . PAF (paroxysmal atrial fibrillation) ([place])   . Chronic pain 02/19/2015  . Morbid obesity ([place]) 02/13/2015  . Dyspnea   . Elevated troponin I level 11/01/2014  . COPD exacerbation ([place]) 11/01/2014  . Essential hypertension 10/31/2014  . Type 2 diabetes mellitus with neuropathy 10/31/2014  . Hyperlipidemia  10/31/2014  . Cigarette smoker 10/31/2014    Expected Discharge Date: Expected Discharge Date: 01/26/19  Team Members Present: Physician leading conference: Dr. Delice Lesch Social Worker Present: Lennart Pall, LCSW Nurse Present: Leonette Nutting, RN PT Present: Barrie Folk, PT OT Present: Mariane Masters, OT PPS Coordinator present : Gunnar Fusi     Current Status/Progress Goal Weekly Team Focus  Medical   Decreased functional mobility secondary to acute on chronic hypoxic respiratory failure complicated by toxic encephalopathy   Improve mobility, transfers, DM, hypokalemia, AKI, hip pain  See above   Bowel/Bladder   Pt is continent B/B. LBM 01/17/2019.  Remain continent of B/B. Maintain regular bowel pattern.  Assist with toileting needs PRN.   Swallow/Nutrition/ Hydration  ADL's   Min A functional transfers, mod A LB ADLs, working on ADL AE however pt states his wife assists with most BADL tasks  supervision   activity tolerance, standing balance/endurance, modified bathing/dressing   Mobility   CGA-min assist overall, gait 50-100' w/ RW, limited  by endurance  supervision overall  endurance, activity tolerance, global strength   Communication             Safety/Cognition/ Behavioral Observations            Pain   Pt complains of pain of 6 to R hip. Pain managed with Percocet.  Pain < 4  Assess pain Q shift and PRN.   Skin   Pt has MASD to the groin and peri area.   Promote healing per orders.  Assess skin Q shift and PRN.    Rehab Goals Patient on target to meet rehab goals: Yes *See Care Plan and progress notes for long and short-term goals.     Barriers to Discharge  Current Status/Progress Possible Resolutions Date Resolved   Physician    Medical stability     See above  Therapies, optimize DM meds, encourae fluids, follow labs, optimize pain meds, supplement K+      Nursing                  PT  Medical stability  decreased endurance, multiple medical comorbidities               OT                  SLP                SW                Discharge Planning/Teaching Needs:  Home with spouse who can provide 24/7 assistance.  Teaching to be planned closer to d/c.   Team Discussion:  Managing weights, DM, chronic hip pain.  Cont b/b.  Min - mod assist overall with therapies and goals set for supervision.    Revisions to Treatment Plan:  NA    Continued Need for Acute Rehabilitation Level of Care: The patient requires daily medical management by a physician with specialized training in physical medicine and rehabilitation for the following conditions: Daily direction of a multidisciplinary physical rehabilitation program to ensure safe treatment while eliciting the highest outcome that is of practical value to the patient.: Yes Daily medical management of patient stability for increased activity during participation in an intensive rehabilitation regime.: Yes Daily analysis of laboratory values and/or radiology reports with any subsequent need for medication adjustment of medical intervention for : Cardiac  problems;Diabetes problems;Other;Renal problems   I attest that I was present, lead the team conference, and concur with the assessment and plan of the team.   Nicholas Caldwell 01/21/2019, 1:29 PM

## 2019-01-22 ENCOUNTER — Inpatient Hospital Stay (HOSPITAL_COMMUNITY): Payer: Medicare Other | Admitting: Physical Therapy

## 2019-01-22 LAB — GLUCOSE, CAPILLARY
Glucose-Capillary: 172 mg/dL — ABNORMAL HIGH (ref 70–99)
Glucose-Capillary: 293 mg/dL — ABNORMAL HIGH (ref 70–99)
Glucose-Capillary: 140 mg/dL — ABNORMAL HIGH (ref 70–99)
Glucose-Capillary: 245 mg/dL — ABNORMAL HIGH (ref 70–99)

## 2019-01-22 NOTE — Progress Notes (Signed)
Physical Therapy Session Note  Patient Details  Name: Nicholas Caldwell MRN: [id number] Date of Birth: 1946/10/31  Today's Date: 01/22/2019 PT Individual Time: 1015-1100 PT Individual Time Calculation (min): 45 min   Short Term Goals: Week 1:  PT Short Term Goal 1 (Week 1): Patient to perform bed mobility with MinA on a consistent basis  PT Short Term Goal 1 - Progress (Week 1): Met PT Short Term Goal 2 (Week 1): Patient to be able to perform sit to stand with MinA from elevated surface on consistent basis  PT Short Term Goal 2 - Progress (Week 1): Met PT Short Term Goal 3 (Week 1): Patient to be able to self-propel WC 184f with VC/min guard  PT Short Term Goal 3 - Progress (Week 1): Met PT Short Term Goal 4 (Week 1): Patient to tolerate gait training approximately 250fwith RW and min guard  PT Short Term Goal 4 - Progress (Week 1): Met  Skilled Therapeutic Interventions/Progress Updates:  Pt was seen bedside in the am. Pt sitting on edge of bed with S. Donned pants, TED hoses, and shirt. Pt performed multiple sit to stand transfers with S. Pt performed stand pivot transfers with rolling walker and S, c/g without assistive device. Pt ambulated 150 feet x 2 with rolling walker and S. Increased O2 to 4 liters for activity. In gym pt performed step taps and alternating step taps, 3 sets x 10 reps each for LE strengthening. Pt returned to room following treatment. Pt transferred edge of bed to w/c with c/g and verbal cues. Pt left sitting up in w/c with chair alarm in place and call bell within reach.   Therapy Documentation Precautions:  Precautions Precautions: Fall Precaution Comments: 3-4L O2 [place] baseline Restrictions Weight Bearing Restrictions: No General:   Pain: No c/o pain.   Therapy/Group: Individual Therapy  Mi[place]lem, [place]/21/2020, 12:06 PM

## 2019-01-22 NOTE — Progress Notes (Addendum)
Nicholas Caldwell is a 73 y.o. male 1946/02/16 [id number]  Subjective: No new complaints. No new problems. Slept well. Feeling OK.  Oxygen is on - nasal cannula  Objective: Vital signs in last 24 hours: Temp:  [97.9 F (36.6 C)-98.3 F (36.8 C)] 97.9 F (36.6 C) (03/21 0409) Pulse Rate:  [74-80] 74 (03/21 0409) Resp:  [16-20] 20 (03/21 0409) BP: (103-110)/(57-59) 103/57 (03/21 0409) SpO2:  [99 %-100 %] 100 % (03/21 0409) Weight:  [113.8 kg] 113.8 kg (03/21 0409) Weight change: -1.3 kg Last BM Date: 01/21/19  Intake/Output from previous day: 03/20 0701 - 03/21 0700 In: 1200 [P.O.:1200] Out: 2745 [Urine:2745] Last cbgs: CBG (last 3)  Recent Labs    01/21/19 1656 01/21/19 2235 01/22/19 0629  GLUCAP 222* 205* 172*     Physical Exam General: No apparent distress   HEENT: not dry.  Oxygen via nasal cannula Lungs: Normal effort. Lungs clear to auscultation with decreased breath sounds, no crackles or wheezes. Cardiovascular: Regular rate and rhythm, no edema Abdomen: S/NT/ND; BS(+) Musculoskeletal:  unchanged Neurological: No new neurological deficits Wounds: N/A    Skin: clear  Aging changes Mental state: Alert, cooperative    Lab Results: BMET    Component Value Date/Time   NA 140 01/21/2019 0541   K 4.1 01/21/2019 0541   CL 95 (L) 01/21/2019 0541   CO2 36 (H) 01/21/2019 0541   GLUCOSE 190 (H) 01/21/2019 0541   BUN 24 (H) 01/21/2019 0541   CREATININE 1.30 (H) 01/21/2019 0541   CREATININE 0.76 03/24/2016 1656   CALCIUM 8.9 01/21/2019 0541   GFRNONAA 55 (L) 01/21/2019 0541   GFRAA >60 01/21/2019 0541   CBC    Component Value Date/Time   WBC 9.5 01/21/2019 0541   RBC 3.02 (L) 01/21/2019 0541   HGB 8.3 (L) 01/21/2019 0541   HCT 26.9 (L) 01/21/2019 0541   PLT 346 01/21/2019 0541   MCV 89.1 01/21/2019 0541   MCH 27.5 01/21/2019 0541   MCHC 30.9 01/21/2019 0541   RDW 19.5 (H) 01/21/2019 0541   LYMPHSABS 1.4 01/20/2019 0523   MONOABS 0.7 01/20/2019 0523   EOSABS 0.3 01/20/2019 0523   BASOSABS 0.0 01/20/2019 0523    Studies/Results: No results found.  Medications: I have reviewed the patient's current medications.  Assessment/Plan:  1.  Acute on chronic renal failure complicated by toxic encephalopathy.  Continue CIR 2.  DVT prophylaxis-on Xarelto 3.  Pain management with gabapentin and oxycodone as needed 4.  Emotional support 5.  PAF.  Rate controlled.  On Xarelto 6.  COPD/tobacco smoking.  The patient was on 3 L/min nasal cannula oxygen prior to admission 7.  Acute on chronic congestive heart failure.  On Demadex 8.  Type 2 diabetes aggravated by steroids.  On Levemir.  NovoLog sliding scale.  Metformin was discontinued due to elevated creatinine 9.  Hyperlipidemia.  On Crestor 10.  Anemia.  Hemoglobin was 8.3 on 3/20.  We will continue to monitor 11.  Hypokalemia.  Corrected 12.  Acute renal failure.  Monitoring labs    Length of stay, days: 8  Walker Kehr , MD 01/22/2019, 9:25 AM

## 2019-01-23 ENCOUNTER — Inpatient Hospital Stay (HOSPITAL_COMMUNITY): Payer: Medicare Other | Admitting: Occupational Therapy

## 2019-01-23 LAB — GLUCOSE, CAPILLARY
GLUCOSE-CAPILLARY: 153 mg/dL — AB (ref 70–99)
Glucose-Capillary: 163 mg/dL — ABNORMAL HIGH (ref 70–99)
Glucose-Capillary: 267 mg/dL — ABNORMAL HIGH (ref 70–99)
Glucose-Capillary: 191 mg/dL — ABNORMAL HIGH (ref 70–99)

## 2019-01-23 NOTE — Progress Notes (Signed)
Occupational Therapy Session Note  Patient Details  Name: Nicholas Caldwell MRN: [id number] Date of Birth: 27-Jan-1946  Today's Date: 01/23/2019 OT Individual Time: 1005-1103 OT Individual Time Calculation (min): 58 min    Short Term Goals: Week 2:  OT Short Term Goal 1 (Week 2): LTG=STG 2/2 ELOS  Skilled Therapeutic Interventions/Progress Updates:    Pt seen for OT ADL bathing/dressing session. Pt in supine upon arrival, ready for therapist. He voiced pain in R hip, reports not time for pain medicine and denying intervention, agreeable to participate as able.  He ambulated throughout session with supervision using RW, assist for management of O2 tank. Pt doffed clothing in prep for bathing task with cuing to initiate use of reacher for increased independence with LB dressing tasks. He bathed seated on padded tub bench, assist to wash/dry B feet and back. Pt ambulated out of bathroom with min A, pt attempting to manage towel to maintain modesty while also steering RW with one hand, assist and VCs for safety awareness provided. He dressed seated in standard chair, threaded B LEs into pants without need of reacher. He stood without AD to pull pants up with guarding assist. Pt provided with shoe buttons and education/demonstration provided regarding use. Pt able to return demonstrate ability to manipulate shoe buttons on R/L. He required min A for getting heel into shoe, would benefit from [facility] shoe horn. He completed grooming tasks from w/c level at sink with set-up, declined standing to complete task 2/2 fatigue. Pt returned to w/c at end of session, left seated with all needs in reach and chair pad alarm on.   Therapy Documentation Precautions:  Precautions Precautions: Fall Precaution Comments: 3-4L O2 [place] baseline Restrictions Weight Bearing Restrictions: No   Therapy/Group: Individual Therapy  Kazuki Ingle L 01/23/2019, 6:46 AM

## 2019-01-23 NOTE — Progress Notes (Signed)
Nicholas Caldwell is a 73 y.o. male [birth year] [id number]  Subjective: No new complaints. No new problems. Slept well. Feeling OK.  Objective: Vital signs in last 24 hours: Temp:  [98.2 F (36.8 C)-98.3 F (36.8 C)] 98.2 F (36.8 C) (03/22 0344) Pulse Rate:  [72-79] 79 (03/22 0804) Resp:  [14-20] 14 (03/22 0804) BP: (94-108)/(51-60) 94/53 (03/22 0804) SpO2:  [98 %-100 %] 99 % (03/22 0804) Weight:  [114.6 kg] 114.6 kg (03/22 0344) Weight change: 0.8 kg Last BM Date: 01/23/19  Intake/Output from previous day: 03/21 0701 - 03/22 0700 In: 1200 [P.O.:1200] Out: 1180 [Urine:1180] Last cbgs: CBG (last 3)  Recent Labs    01/22/19 1639 01/22/19 2057 01/23/19 0620  GLUCAP 140* 245* 163*     Physical Exam General: No apparent distress   HEENT: not dry Lungs: Normal effort. Lungs clear to auscultation, no crackles or wheezes.  There are decreased breath sounds at both bases. Cardiovascular: Regular rate and rhythm, no edema Abdomen: S/NT/ND; BS(+) Musculoskeletal:  unchanged Neurological: No new neurological deficits Wounds: N/A    Skin: clear  Aging changes Mental state: Alert, oriented, cooperative    Lab Results: BMET    Component Value Date/Time   NA 140 01/21/2019 0541   K 4.1 01/21/2019 0541   CL 95 (L) 01/21/2019 0541   CO2 36 (H) 01/21/2019 0541   GLUCOSE 190 (H) 01/21/2019 0541   BUN 24 (H) 01/21/2019 0541   CREATININE 1.30 (H) 01/21/2019 0541   CREATININE 0.76 03/24/2016 1656   CALCIUM 8.9 01/21/2019 0541   GFRNONAA 55 (L) 01/21/2019 0541   GFRAA >60 01/21/2019 0541   CBC    Component Value Date/Time   WBC 9.5 01/21/2019 0541   RBC 3.02 (L) 01/21/2019 0541   HGB 8.3 (L) 01/21/2019 0541   HCT 26.9 (L) 01/21/2019 0541   PLT 346 01/21/2019 0541   MCV 89.1 01/21/2019 0541   MCH 27.5 01/21/2019 0541   MCHC 30.9 01/21/2019 0541   RDW 19.5 (H) 01/21/2019 0541   LYMPHSABS 1.4 01/20/2019 0523   MONOABS 0.7 01/20/2019 0523   EOSABS 0.3 01/20/2019 0523   BASOSABS 0.0 01/20/2019 0523    Studies/Results: No results found.  Medications: I have reviewed the patient's current medications.  Assessment/Plan:   1.  Renal failure, acute on chronic.  It was complicated by toxic encephalopathy.  Continue CIR 2.  DVT prophylaxis with Xarelto 3.  Pain management with oxycodone and gabapentin 4.  Emotional support 5.  Paroxysmal atrial fibrillation.  On Xarelto.  Rate controlled 6.  COPD complicated by tobacco smoking.  The patient was on 3 L/min nasal cannula oxygen prior to admission.  Continue with oxygen 7.  Acute on chronic congestive heart failure.  Continue with Demadex 8.  Diabetes mellitus type 2 aggravated by steroids.  Currently on Levemir and NovoLog sliding scale.  Metformin was discontinued due to elevated creatinine 9.  Hyperlipidemia.  Continue with Crestor 10.  Anemia.  Continue to monitor CBC 11.  Hypokalemia, corrected 12.  Renal failure.  Continue to monitor labs.     Length of stay, days: 9  Walker Kehr , MD 01/23/2019, 11:13 AM

## 2019-01-24 ENCOUNTER — Inpatient Hospital Stay (HOSPITAL_COMMUNITY): Payer: Medicare Other

## 2019-01-24 ENCOUNTER — Inpatient Hospital Stay (HOSPITAL_COMMUNITY): Payer: Medicare Other | Admitting: Occupational Therapy

## 2019-01-24 DIAGNOSIS — E669 Obesity, unspecified: Secondary | ICD-10-CM

## 2019-01-24 DIAGNOSIS — E1169 Type 2 diabetes mellitus with other specified complication: Secondary | ICD-10-CM

## 2019-01-24 DIAGNOSIS — E782 Mixed hyperlipidemia: Secondary | ICD-10-CM

## 2019-01-24 LAB — BASIC METABOLIC PANEL
Anion gap: 10 (ref 5–15)
BUN: 17 mg/dL (ref 8–23)
CO2: 34 mmol/L — ABNORMAL HIGH (ref 22–32)
Calcium: 8.6 mg/dL — ABNORMAL LOW (ref 8.9–10.3)
Chloride: 95 mmol/L — ABNORMAL LOW (ref 98–111)
Creatinine, Ser: 1.23 mg/dL (ref 0.61–1.24)
GFR calc Af Amer: >60 mL/min (ref >60)
GFR calc non Af Amer: 59 mL/min — ABNORMAL LOW (ref >60)
Glucose, Bld: 190 mg/dL — ABNORMAL HIGH (ref 70–99)
Potassium: 3.7 mmol/L (ref 3.5–5.1)
Sodium: 139 mmol/L (ref 135–145)

## 2019-01-24 LAB — GLUCOSE, CAPILLARY
Glucose-Capillary: 151 mg/dL — ABNORMAL HIGH (ref 70–99)
Glucose-Capillary: 246 mg/dL — ABNORMAL HIGH (ref 70–99)

## 2019-01-24 LAB — MAGNESIUM: Magnesium: 2 mg/dL (ref 1.7–2.4)

## 2019-01-24 MED ORDER — MAGNESIUM OXIDE -MG SUPPLEMENT 420 (252 MG) MG PO TABS: 420.0000 mg | ORAL_TABLET | Freq: Two times a day (BID) | ORAL | 0 refills | Status: DC

## 2019-01-24 MED ORDER — OXYCODONE-ACETAMINOPHEN 5-325 MG PO TABS: 2.0000 | ORAL_TABLET | Freq: Four times a day (QID) | ORAL | 0 refills | Status: DC | PRN

## 2019-01-24 MED ORDER — INSULIN DETEMIR 100 UNIT/ML FLEXPEN: 20.0000 [IU] | PEN_INJECTOR | Freq: Every day | SUBCUTANEOUS | 11 refills | Status: DC

## 2019-01-24 MED ORDER — ACETAMINOPHEN 160 MG/5ML PO SOLN: 650.0000 mg | Freq: Four times a day (QID) | ORAL | 0 refills | Status: DC | PRN

## 2019-01-24 MED ORDER — INSULIN DETEMIR 100 UNIT/ML FLEXPEN: 20.0000 [IU] | Freq: Every day | SUBCUTANEOUS | 1 refills | Status: DC

## 2019-01-24 MED ORDER — POLYETHYLENE GLYCOL 3350 17 G PO PACK: 17.0000 g | PACK | Freq: Every day | ORAL | 0 refills | Status: DC

## 2019-01-24 MED ORDER — ADULT MULTIVITAMIN W/MINERALS CH: 1.0000 | ORAL_TABLET | Freq: Every day | ORAL | Status: DC

## 2019-01-24 MED ORDER — DICLOFENAC SODIUM 1 % TD GEL: 2.0000 g | Freq: Four times a day (QID) | TRANSDERMAL | 1 refills | Status: DC

## 2019-01-24 MED ORDER — PANTOPRAZOLE SODIUM 40 MG PO TBEC: 40.0000 mg | DELAYED_RELEASE_TABLET | Freq: Two times a day (BID) | ORAL | 0 refills | Status: DC

## 2019-01-24 MED ORDER — POTASSIUM CHLORIDE CRYS ER 20 MEQ PO TBCR: 20.0000 meq | EXTENDED_RELEASE_TABLET | Freq: Every day | ORAL | 0 refills | Status: DC

## 2019-01-24 MED ORDER — ISOSORB DINITRATE-HYDRALAZINE 20-37.5 MG PO TABS: 1.0000 | ORAL_TABLET | Freq: Three times a day (TID) | ORAL | 3 refills | Status: DC

## 2019-01-24 MED ORDER — ROSUVASTATIN CALCIUM 20 MG PO TABS: 20.0000 mg | ORAL_TABLET | Freq: Every day | ORAL | 0 refills | Status: DC

## 2019-01-24 MED ORDER — INSULIN ASPART 100 UNIT/ML ~~LOC~~ SOLN
6.0000 [IU] | Freq: Three times a day (TID) | SUBCUTANEOUS | Status: DC
  Administered 2019-01-24: 6 [IU] via SUBCUTANEOUS

## 2019-01-24 MED ORDER — BISOPROLOL FUMARATE 5 MG PO TABS: 2.5000 mg | ORAL_TABLET | Freq: Every day | ORAL | 3 refills | Status: DC

## 2019-01-24 MED ORDER — TORSEMIDE 20 MG PO TABS: 80.0000 mg | ORAL_TABLET | Freq: Two times a day (BID) | ORAL | 0 refills | Status: DC

## 2019-01-24 MED ORDER — GABAPENTIN 600 MG PO TABS: 600.0000 mg | ORAL_TABLET | Freq: Two times a day (BID) | ORAL | 0 refills | Status: DC

## 2019-01-24 NOTE — Progress Notes (Signed)
Patient and wife given discharge instructions by Dan,PA. Patient questions answered. Patient was wheeled down via wheelchair by nurse tech with all his personal belongings.  Nicholes Rough, LPN

## 2019-01-24 NOTE — Progress Notes (Signed)
Social Work  Discharge Note  The overall goal for the admission was met for:   Discharge location: Yes- home with wife who will provide 24/7 support  Length of Stay: Yes - actually met goals and able to d/c earlier than planned with LOS = 10 days  Discharge activity level: Yes - supervision overall  Home/community participation: Yes  Services provided included: MD, RD, PT, OT, RN, Pharmacy and [place]: Medicare  Follow-up services arranged: Home Health: RN, PT, OT via [facility] and Patient/Family has no preference for HH/DME agencies  Comments (or additional information):  Contact Info:   Pt at [phone]  Patient/Family verbalized understanding of follow-up arrangements: Yes  Individual responsible for coordination of the follow-up plan: pt  Confirmed correct DME delivered: NA - had all needed DME.  Pt leaving CIR and plans to go directly to [facility] to pick up CPAP for home use.    Nicholas Caldwell

## 2019-01-24 NOTE — Discharge Summary (Addendum)
Physician Discharge Summary  Patient ID: Nicholas Caldwell MRN: [id number] DOB/AGE: 03/15/1946 73 y.o.  Admit date: 01/14/2019 Discharge date: 01/24/2019  Discharge Diagnoses:  Active Problems:   Acute on chronic respiratory failure (HCC)   Supplemental oxygen dependent   Leukocytosis   Hypokalemia   Acute on chronic anemia   Diabetic peripheral neuropathy (HCC)   Acute on chronic systolic (congestive) heart failure (HCC)   Hypomagnesemia   Steroid-induced hyperglycemia   Diabetes mellitus type 2 in nonobese (HCC)   Primary osteoarthritis of right hip   Diabetes mellitus type 2 in obese ([place]) tobacco abuse/COPD  Discharged Condition: stable  Significant Diagnostic Studies: Dg Chest 2 View  Result Date: 01/02/2019 CLINICAL DATA:  Hypoxia EXAM: CHEST - 2 VIEW COMPARISON:  12/30/2018 FINDINGS: Cardiac shadow is enlarged but stable. Increased vascular congestion is again identified. Increasing left basilar density consistent with evolving infiltrate/atelectasis is noted. Left effusion is noted as well. Small right-sided pleural effusion is noted. IMPRESSION: Increasing atelectasis/infiltrate in the left base with associated effusion. Stable vascular congestion is noted. Electronically Signed   By: Inez Catalina M.D.   On: 01/02/2019 12:16   Ct Chest Wo Contrast  Result Date: 01/03/2019 CLINICAL DATA:  73 year old with shortness of breath. History of systolic and diastolic heart failure. EXAM: CT CHEST WITHOUT CONTRAST TECHNIQUE: Multidetector CT imaging of the chest was performed following the standard protocol without IV contrast. COMPARISON:  Abdominal CT 01/02/2019 FINDINGS: Cardiovascular: Atherosclerotic calcifications in the coronary arteries and thoracic aorta. Heart is enlarged. Small amount of pericardial fluid. Mediastinum/Nodes: Large number of small lymph nodes scattered throughout the mediastinum. Index lymph node in the pretracheal region measures 1.3 cm in the short axis on sequence  3, image 59. Probable small hilar lymph nodes but limited evaluation on this noncontrast examination. No significant axillary lymph node enlargement. Lungs/Pleura: Trachea and mainstem bronchi are patent. Centrilobular and paraseptal emphysema. Small right pleural effusion. Volume loss in the right lower lobe. Mild septal thickening in the lungs. 3 mm nodule in right upper lobe on sequence 5, image 56. 3 mm nodule near the right minor fissure on sequence 5, image 80. Significant volume loss in the left lower lobe. 7 x 6 mm nodule near the left lung apex on sequence 5 image 23. 6 mm nodule at the left lung apex on image 31. 8 x 6 mm nodule in the left upper lobe on sequence 5 image 34. Upper Abdomen: Again noted are scattered areas of soft tissue in the left upper quadrant probably representing accessory spleens following the splenectomy. Again noted is thickening in the left adrenal gland with Hounsfield units measuring roughly 32 and this cannot be considered a benign adenoma based on these imaging findings. Again noted are small lymph nodes in the upper abdomen. Images of the upper abdomen are limited due to motion. Musculoskeletal: Bridging osteophytes in thoracic spine. No acute bone abnormality. IMPRESSION: 1. Small right pleural effusion with compressive atelectasis in the right lower lobe. 2. Volume loss in left lower lobe. 3. Several small pulmonary nodules. Largest pulmonary nodule measures 8 mm in the left upper lobe. Nodules could be inflammatory but indeterminate. Non-contrast chest CT at 3-6 months is recommended. If the nodules are stable at time of repeat CT, then future CT at 18-24 months (from today's scan) is considered optional for low-risk patients, but is recommended for high-risk patients. This recommendation follows the consensus statement: Guidelines for Management of Incidental Pulmonary Nodules Detected on CT Images: From the Fleischner Society 2017; Radiology  2017; 546:270-350. 4. Aortic  Atherosclerosis (ICD10-I70.0) and Emphysema (ICD10-J43.9). Cardiomegaly. 5. Mild septal thickening in the lungs. These findings could be related to mild or chronic pulmonary edema. 6. Several small lymph nodes scattered throughout the chest. Lymph nodes are nonspecific but may be reactive. Recommend attention on follow up imaging. 7. Indeterminate left adrenal nodule/thickening. Electronically Signed   By: Markus Daft M.D.   On: 01/03/2019 14:04   Ct Abdomen Pelvis W Contrast  Result Date: 01/03/2019 CLINICAL DATA:  Acute abdominal pain. EXAM: CT ABDOMEN AND PELVIS WITH CONTRAST TECHNIQUE: Multidetector CT imaging of the abdomen and pelvis was performed using the standard protocol following bolus administration of intravenous contrast. CONTRAST:  124m OMNIPAQUE IOHEXOL 300 MG/ML  SOLN COMPARISON:  Renal ultrasound 12/01/2018, no prior abdominal CT. FINDINGS: Lower chest: Small to moderate right and small left pleural effusion. Associated compressive atelectasis in the lower lobes. Cardiomegaly with small pericardial effusion. Hepatobiliary: No focal hepatic abnormality. Gallbladder physiologically distended, no calcified stone. No biliary dilatation. Pancreas: No ductal dilatation or inflammation. Spleen: Post splenectomy with multifocal splenic deposits in the left upper quadrant. Soft tissue deposits in the lesser sac may be splenosis or lymph nodes. Adrenals/Urinary Tract: Possible 2 cm left adrenal nodule versus adjacent splenic deposit. The right adrenal gland appears normal. No hydronephrosis. No visualized nephrolithiasis. Absent renal excretion on delayed phase imaging. Urinary bladder is physiologically distended, no bladder wall thickening. Stomach/Bowel: Stomach is nondistended. Normal positioning of the duodenal jejunal junction. No small bowel dilatation, obstruction or inflammation. Mild fecalization of mid small bowel contents. Administered enteric contrast reaches the distal small bowel. Normal  appendix. Moderate volume of stool throughout the colon. There is significant sigmoid colonic tortuosity. Vascular/Lymphatic: Aorto bi-iliac atherosclerosis. No aneurysm. Prominent and mildly enlarged retroperitoneal and upper abdominal lymph nodes, anterior caval node measures 14 mm short axis at the level of the gallbladder. Multiple additional prominent retroperitoneal nodes. Difficult to differentiate lymph nodes from splenosis related to splenectomy. There are small pericardial nodes. Reproductive: Streak artifact from left hip arthroplasty partially obscures the prostate gland. Other: Retroperitoneal stranding greatest in the lower abdomen/pelvis. No frank ascites. No free air. Flank edema, left greater than right. Musculoskeletal: Degenerative change in the spine. Degenerative change in the right hip with avascular necrosis, no subchondral collapse. Left hip arthroplasty. IMPRESSION: 1. No acute findings or explanation for abdominal pain. 2. Multiple prominent and enlarged lymph nodes in the retroperitoneum and upper abdomen, likely reactive but nonspecific. Additionally patient is post splenectomy with multifocal splenosis and splenic deposits in the upper abdomen, which complicates lymph node evaluation. There are no prior exams available for comparison to evaluate for stability. Recommend clinical and laboratory evaluation for lymphoproliferative disorder. The absence of prior imaging, recommend CT follow-up in 3 months. 3. Absent renal excretion on delayed phase imaging consistent with renal dysfunction. 4. Small to moderate right and small left pleural effusions. Cardiomegaly with small pericardial effusion. 5.  Aortic Atherosclerosis (ICD10-I70.0). Electronically Signed   By: MKeith RakeM.D.   On: 01/03/2019 00:35   [place]Abdomen Limited  Result Date: 01/03/2019 CLINICAL DATA:  Splenectomy EXAM: LIMITED ABDOMEN ULTRASOUND FOR ASCITES TECHNIQUE: Limited ultrasound survey for ascites was performed  in all four abdominal quadrants. COMPARISON:  CT 01/02/2019 FINDINGS: No significant ascites visualized within the abdomen or pelvis. IMPRESSION: Negative for significant ascites by ultrasound. Electronically Signed   By: KDonavan FoilM.D.   On: 01/03/2019 20:15   Dg Chest Port 1 View  Result Date: 01/11/2019 CLINICAL DATA:  Follow-up endotracheal tube EXAM: PORTABLE CHEST 1 VIEW COMPARISON:  01/10/2019 FINDINGS: Endotracheal tube, gastric catheter and right jugular central line are again seen and stable. Cardiac shadow remains enlarged. Persistent left basilar opacity and effusion is seen. No new focal infiltrate on the right is noted. IMPRESSION: Stable appearance of left basilar infiltrate with effusion. Tubes and lines as described. Electronically Signed   By: Inez Catalina M.D.   On: 01/11/2019 05:47   Dg Chest Port 1 View  Result Date: 01/10/2019 CLINICAL DATA:  Respiratory failure EXAM: PORTABLE CHEST 1 VIEW COMPARISON:  Yesterday FINDINGS: Endotracheal tube tip at the clavicular heads. The orogastric tube at least reaches the stomach. Right IJ line with tip at the SVC. Lung opacity at the bases with small left pleural effusion. No pneumothorax. Cardiomegaly. IMPRESSION: 1. Unremarkable hardware positioning. 2. Lower lobe atelectasis and pleural fluid based on recent chest CT. No change from yesterday. Electronically Signed   By: Monte Fantasia M.D.   On: 01/10/2019 06:55   Dg Chest Port 1 View  Result Date: 01/09/2019 CLINICAL DATA:  Respiratory failure EXAM: PORTABLE CHEST 1 VIEW COMPARISON:  01/08/2019 FINDINGS: Cardiac shadow is mildly enlarged but stable. Endotracheal tube, gastric catheter and right jugular central line are again seen and stable. Persistent left basilar atelectasis with associated effusion is noted. No new focal infiltrate is seen. IMPRESSION: Stable appearance of the chest from the previous day. Electronically Signed   By: Inez Catalina M.D.   On: 01/09/2019 07:20   Dg  Chest Port 1 View  Result Date: 01/08/2019 CLINICAL DATA:  Respiratory failure EXAM: PORTABLE CHEST 1 VIEW COMPARISON:  01/07/2019 FINDINGS: Cardiac shadow is stable. Endotracheal tube, gastric catheter and right jugular central line are again seen and stable. The lungs are well aerated bilaterally. Persistent left basilar opacity with associated effusion is seen. Mild right basilar changes have improved somewhat in the interval from the prior exam. IMPRESSION: Persistent left basilar opacity with associated effusion. Electronically Signed   By: Inez Catalina M.D.   On: 01/08/2019 08:07   Dg Chest Port 1 View  Result Date: 01/07/2019 CLINICAL DATA:  Respiratory failure EXAM: PORTABLE CHEST 1 VIEW COMPARISON:  01/06/2019 FINDINGS: Support devices are stable. Cardiomegaly with vascular congestion, bilateral perihilar and lower lobe airspace opacities. Possible left effusion. No acute bony abnormality. IMPRESSION: No significant change since prior study. Electronically Signed   By: Rolm Baptise M.D.   On: 01/07/2019 07:17   Dg Chest Port 1 View  Result Date: 01/06/2019 CLINICAL DATA:  Respiratory failure EXAM: PORTABLE CHEST 1 VIEW COMPARISON:  Yesterday FINDINGS: The internal and external portions of the endotracheal tube overlap. Based on tubing, the internal tip is at the level of the clavicular heads, stable. The orogastric tube at least reaches the stomach. Unremarkable right IJ line. Haziness of the bilateral lower chest, stable and characterized by CT 3 days ago. Cardiomegaly. IMPRESSION: 1. Stable hardware positioning. 2. Unchanged lung opacity and layering pleural fluid. Electronically Signed   By: Monte Fantasia M.D.   On: 01/06/2019 08:01   Dg Chest Port 1 View  Result Date: 01/05/2019 CLINICAL DATA:  History of endotracheal tube EXAM: PORTABLE CHEST 1 VIEW COMPARISON:  Yesterday FINDINGS: Endotracheal tube tip at the clavicular heads. The orogastric tube at least reaches the stomach. Right IJ line  with tip at the upper cavoatrial junction region. Cardiomegaly and diffuse lung opacity asymmetric to the left. Left diaphragm may be elevated. No pneumothorax. IMPRESSION: 1. Stable hardware positioning. 2. Unchanged  extensive opacity with atelectasis and vascular congestion seen on chest CT 2 days ago Electronically Signed   By: Monte Fantasia M.D.   On: 01/05/2019 07:48   Portable Chest X-ray  Result Date: 01/04/2019 CLINICAL DATA:  73 y/o  M; encounter for central line placement. EXAM: PORTABLE CHEST 1 VIEW COMPARISON:  01/03/2019 CT chest. 01/02/2019 chest radiograph. FINDINGS: Small right and moderate left pleural effusions. Left basilar opacity. Diffuse hazy opacities of the lungs. Findings similar to prior chest radiograph given differences in technique. Endotracheal tube tip projects 3.8 cm above the carina. Right central venous catheter tip projects over mid SVC. Enteric tube extends below the field of view into the abdomen. No pneumothorax. Aortic arch calcific atherosclerosis. No acute osseous abnormality is evident. Cardiomegaly, partially obscured by left basilar opacity. IMPRESSION: 1. Right central venous catheter tip projects over mid SVC. Enteric tube extends below the field of view into the abdomen. Enteric tube tip projects 3.9 cm above carina. 2. Small right and moderate left pleural effusions, left basilar consolidation, and diffuse hazy opacities of the lungs are stable from prior chest radiograph. Electronically Signed   By: Kristine Garbe M.D.   On: 01/04/2019 17:01   Dg Chest Port 1 View  Result Date: 12/30/2018 CLINICAL DATA:  Dyspnea EXAM: PORTABLE CHEST 1 VIEW COMPARISON:  12/07/2018 FINDINGS: Diffuse bilateral airspace disease compatible with edema is unchanged. Small bilateral effusions and bibasilar atelectasis unchanged. Right-sided vascular catheter has been removed. IMPRESSION: No interval change in congestive heart failure with edema and bilateral effusions.  Electronically Signed   By: Franchot Gallo M.D.   On: 12/30/2018 17:02    Labs:  Basic Metabolic Panel: Recent Labs  Lab 01/18/19 0513 01/19/19 0450 01/20/19 0523 01/21/19 0541 01/24/19 0820  NA 143 140 138 140 139  K 3.3* 3.4* 3.6 4.1 3.7  CL 95* 94* 94* 95* 95*  CO2 35* 37* 32 36* 34*  GLUCOSE 228* 250* 185* 190* 190*  BUN 34* 31* 26* 24* 17  CREATININE 1.38* 1.31* 1.20 1.30* 1.23  CALCIUM 8.6* 8.4* 8.4* 8.9 8.6*  MG  --   --   --   --  2.0    CBC: Recent Labs  Lab 01/20/19 0523 01/21/19 0541  WBC 9.9 9.5  NEUTROABS 7.4  --   HGB 7.9* 8.3*  HCT 26.0* 26.9*  MCV 88.7 89.1  PLT 337 346    CBG: Recent Labs  Lab 01/23/19 0620 01/23/19 1151 01/23/19 1644 01/23/19 2120 01/24/19 0641  GLUCAP 163* 153* 267* 191* 151*   Family history. Mother with heart disease and hypertension as well as congestive heart failure. Father with heart disease. Sister with hypertension. Negative history of stroke  Brief HPI:    Nicholas Caldwell is a 73 year old right-handed male history of atrial flutter/PAF on Xarelto, CAD, chronic back pain maintained on Neurontin as well as oxycodone as needed, chronic diastolic congestive heart failure, diabetes mellitus with Glucophage 1000 mg twice daily as well as NovoLog 7030 insulin, hypertension maintained on Bidil , COPD with tobacco abuse with home oxygen. Lives with spouse used a rolling walker prior to admission. Presented to 20 05/23/2019 with increasing shortness of breath approximately 12 pound weight gain over 4 days. Increased edema lower extremities. Chest x-ray showed no interval change in congestive heart failure with edema bilateral effusions. CT of the chest showed small right pleural effusion as well as several small pulmonary nodules largest pulmonary nodule measuring 8 mm left upper lobe noncontrast CT the chest recommended  3-6 months for follow-up. Ultrasound the abdomen negative for significant ascites. Acute on chronic anemia 8.7. EKG  atrial fibrillation with history of PAF remained on Xarelto. Follow-up cardiology services patient did not require cardioversion remained on intravenous heparin for a short time. Noted ongoing bouts of acute respiratory failure related to CHF required intubation 01/04/2019 through 01/11/2019. Therapy evaluations completed and patient was admitted for a comprehensive rehabilitation program.  Hospital Course: Nicholas Caldwell was admitted to rehab 01/14/2019 for inpatient therapies to consist of PT, ST and OT at least three hours five days a week. Past admission physiatrist, therapy team and rehab RN have worked together to provide customized collaborative inpatient rehab. Pertaining to patient acute on chronic respiratory failure continue with oxygen therapy as directed. Steroid taper noted. Working with energy conservation techniques. Patient continues Xarelto for history of PAF atrial fibrillation with chest pain or shortness of breath. Pain management he's of Neurontin 600 mg twice daily as well as oxycodone as needed. Blood pressure monitored with Zebeta and Bidil with planned follow-up outpatient cardiology services as well as noted acute on chronic systolic congestive heart failure Demadex as advised exhibiting no other signs of fluid overload. Patient did have a history of tobacco use COPD using oxygen at home he received full count regards to cessation of nicotine products. Blood sugars controlled or monitor closely while on steroid taper insulin therapy as directed. Patient resumed his 70/30 insulin as well as Glucophage. He continued on Crestor for hyperlipidemia. Acute on chronic anemia 8.3 no bleeding episodes. AKI close monitoring while undergoing diuresis.  Physical exam. Blood pressure 96/37 pulse 76 temp 97 respirations 18 oxygen saturates 94% Constitutional. Well-developed well-nourished no distress HEENT normocephalic Eyes. Pupils are equal round and reactive to light EOMs are normal Neck normal  range of motion no tracheal deviation present no thyromegaly present. Cardiac normal rate and rhythm no murmur heard without bruits Respiratory effort normal breath sounds normal respiratory distress without wheezes no rails GI soft bowel sounds normal exams no distention nontender no rebound Musculoskeletal normal range of motion Gen. No edema Neurological. Patient oriented to person place month and year. Fair awareness of deficits. Slow processing. Provided some biographical information. Upper extremities 4 out of 5 proximal to distal lower extremities 3+4 minus out of 5 hip flexors and knee extension 4 out of 5 ankle dorsi plantar flexion. No sensory deficits.    Rehab course: During patient's stay in rehab weekly team conferences were held to monitor patient's progress, set goals and discuss barriers to discharge. At admission, patient required minimal guard to ambulate 5 feet rolling walker, moderate assist stand pivot transfers, moderate assist supine to sit, max assist sit to supine. ADLs max assist lower body bathing, minimal assist upper body dressing, max assist lower body dressing, moderate assist to transfers.  He  has had improvement in activity tolerance, balance, postural control as well as ability to compensate for deficits. He/She has had improvement in functional use RUE/LUE  and RLE/LLE as well as improvement in awareness. Patient would sit edge of bed with supervision.He could don on his pants and shirt edge of bed. Perform multiple sit to stand transfers with supervision. Performed stand pivot transfers with rolling walker and supervision. Ambulates 150 feet 2 rolling walker supervision with oxygen needs as directed. Working with energy conservation techniques. He could ambulate to the bathroom with rolling walker.  He could bathe seated, dress seated and standard chair. Able to demonstrate ability to manipulate shoes buttons right greater than left.  Full family teaching was  completed wife and patient quite concerned of current coronavirus epidemic requested discharge to home. Full family teaching was completed       Disposition:  discharge to home   Diet: diabetic diet  Special Instructions: Smoking Continue oxygen therapy as prior to admission  Medications at time of discharge 1. Tylenol as needed 2. Zebeta 2.5 mg daily 3. Voltaren gel apply to affected area 4 times daily 4. Folic acid 1 mg by mouth daily 5. Neurontin 600 mg by mouth twice a day 6. 70/30 5 units twice a dayinsulin 7.Bidil  20-30 7.5 mg 0.5 tablet 3 times daily 8.Xalatan ophthalmic solution 1 drop both eyes bedtime 9. Magnesium oxide 400 mg twice a day 10. Multivitamin daily 11. Oxycodone 2 tablets every 6 hours as needed pain 12. Protonix 40 mg by mouth twice a day 13 MiraLAX 17 g daily 14. Potassium chloride 20 mEq by mouth daily 15 Xarelto 20 mg daily 16 Crestor 20 mg by mouth daily 17 Demadex 80 mg by mouth twice a day 18. Glucophage 1000 mg twice a day 18. NovoLog 6 units 3 times a day  Discharge Instructions    Ambulatory referral to Physical Medicine Rehab   Complete by:  As directed    Moderate complexity follow-up 1-2 weeks debilitation with acute chronic respiratory failure      Follow-up Information    Jamse Arn, MD Follow up.   Specialty:  Physical Medicine and Rehabilitation Why:  Office to call for appointment Contact information: [address] [place] [address] [phone]        Larey Dresser, MD Follow up.   Specialty:  Cardiology Why:  call for appointment Contact information: [address] [place] [address] [phone]           Signed: Cathlyn Parsons 01/24/2019, 11:20 AM Patient seen and examined by me on day of discharge. Delice Lesch, MD, ABPMR

## 2019-01-24 NOTE — Discharge Instructions (Signed)
Inpatient Rehab Discharge Instructions  Nicholas Caldwell Discharge date and time: No discharge date for patient encounter.   Activities/Precautions/ Functional Status: Activity: as tolerated Diet: diabetic diet Wound Care: none needed Functional status:  ___ No restrictions     ___ Walk up steps independently ___ 24/7 supervision/assistance   ___ Walk up steps with assistance ___ Intermittent supervision/assistance  ___ Bathe/dress independently ___ Walk with walker     _x__ Bathe/dress with assistance ___ Walk Independently    ___ Shower independently ___ Walk with assistance    ___ Shower with assistance ___ No alcohol     ___ Return to work/school ________    COMMUNITY REFERRALS UPON DISCHARGE:    Home Health:   PT     OT       RN                     Agency:  [facility]   Phone: [phone]      Special Instructions: No driving  Follow-up CT the chest 3-6 months for monitoring of pulmonary nodules  My questions have been answered and I understand these instructions. I will adhere to these goals and the provided educational materials after my discharge from the hospital.  Patient/Caregiver Signature _______________________________ Date __________  Clinician Signature _______________________________________ Date __________  Please bring this form and your medication list with you to all your follow-up doctor's appointments.   Information on my medicine - XARELTO (Rivaroxaban)  This medication education was reviewed with me or my healthcare representative as part of my discharge preparation.  The pharmacist that spoke with me during my hospital stay was:  Onnie Boer, RPH-CPP  Why was Xarelto prescribed for you? Xarelto was prescribed for you to reduce the risk of a blood clot forming that can cause a stroke if you have a medical condition called atrial fibrillation (a type of irregular heartbeat).  What do you need to know about xarelto ? Take your Xarelto ONCE  DAILY at the same time every day with your evening meal. If you have difficulty swallowing the tablet whole, you may crush it and mix in applesauce just prior to taking your dose.  Take Xarelto exactly as prescribed by your doctor and DO NOT stop taking Xarelto without talking to the doctor who prescribed the medication.  Stopping without other stroke prevention medication to take the place of Xarelto may increase your risk of developing a clot that causes a stroke.  Refill your prescription before you run out.  After discharge, you should have regular check-up appointments with your healthcare provider that is prescribing your Xarelto.  In the future your dose may need to be changed if your kidney function or weight changes by a significant amount.  What do you do if you miss a dose? If you are taking Xarelto ONCE DAILY and you miss a dose, take it as soon as you remember on the same day then continue your regularly scheduled once daily regimen the next day. Do not take two doses of Xarelto at the same time or on the same day.   Important Safety Information A possible side effect of Xarelto is bleeding. You should call your healthcare provider right away if you experience any of the following: ? Bleeding from an injury or your nose that does not stop. ? Unusual colored urine (red or dark brown) or unusual colored stools (red or black). ? Unusual bruising for unknown reasons. ? A serious fall or if you hit  your head (even if there is no bleeding).  Some medicines may interact with Xarelto and might increase your risk of bleeding while on Xarelto. To help avoid this, consult your healthcare provider or pharmacist prior to using any new prescription or non-prescription medications, including herbals, vitamins, non-steroidal anti-inflammatory drugs (NSAIDs) and supplements.  This website has more information on Xarelto: [email or web address]

## 2019-01-24 NOTE — Progress Notes (Signed)
Occupational Therapy Session Note  Patient Details  Name: Nicholas Caldwell MRN: [id number] Date of Birth: 03/22/1946  Today's Date: 01/24/2019 OT Individual Time: 4196-2229 OT Individual Time Calculation (min): 55 min    Short Term Goals: Week 2:  OT Short Term Goal 1 (Week 2): LTG=STG 2/2 ELOS  Skilled Therapeutic Interventions/Progress Updates:    Discussion with MD re moving up d/c for pt d/t recent visitor restrictions in hospital. Care coordination completed with rehab team to ensure safe d/c home and to anticipate needs. Pt completed SPT to w/c from EOB with (S). Pt sat at sink and washed UB with mod I. Increased SOB with removal of [place] during bathing. Edu provided re importance of maintaining [place] in for majority of bathing/dressing. With sit <> stand in w/c pt able to wash LB with (S). Pt reports for very distal washing wife will assist. Pt donned pajama pants with increased time and (S) overall. Trialed used of LH shoe horn to don shoes, pt will require an even longer shoe horn than what rehab has available d/t difficulty coordinating reaching with leg lift. Min A overall to don shoes. With increased time and heavy effort/work of breathing pt able to fasten shoes with use of shoe button. Pt and wife edu on energy conservation strategies and provided handout for carryover. Pt left sitting up in w/c with all needs met.   Therapy Documentation Precautions:  Precautions Precautions: Fall Precaution Comments: 3-4L O2 [place] baseline Restrictions Weight Bearing Restrictions: No Pain: Pain Assessment Pain Scale: 0-10 Pain Score: 7  Pain Type: Chronic pain Pain Location: Hip Pain Orientation: Right Pain Descriptors / Indicators: Aching Pain Intervention(s): Rest   Therapy/Group: Individual Therapy  Curtis Sites 01/24/2019, 9:04 AM

## 2019-01-24 NOTE — Progress Notes (Signed)
Physical Therapy Discharge Summary  Patient Details  Name: Nicholas Caldwell MRN: [id number] Date of Birth: 1946-09-11  Today's Date: 01/24/2019 PT Individual Time: 1025-1118 PT Individual Time Calculation (min): 53 min    Patient has met 10 of 10 long term goals due to improved activity tolerance, improved balance, improved postural control, increased strength and ability to compensate for deficits.  Patient to discharge at an ambulatory level supervision with RW.    Reasons goals not met: n/a  Recommendation:  Patient will benefit from ongoing skilled PT services in home health setting to continue to advance safe functional mobility, address ongoing impairments in balance, endurance, gait with LRAD, strength, and minimize fall risk.  Equipment: no equipment - pt reports he already has RW  Reasons for discharge: discharge from hospital  Patient/family agrees with progress made and goals achieved: Yes  Skilled PT Treatment: Patient received in w/c with wife present but declining observing session, reporting she feels comfortable assisting pt at home. Pt propels w/c ~75 ft with BUE & BLE with supervision, completes car transfer at SUV simulated height with supervision & cuing to square up to surface before sitting then transferring BLE in/out of car, negotiates ramp, uneven surface & curb with RW & supervision. Pt ambulates 100 ft with RW & supervision, completes bed mobility on mat table with supervision, and reports he would retreive object from floor with his reacher PRN. Provided pt with HEP consisting of marching, hip abduction, mini squats, and heel raises with pt completing all tasks with cuing for technique (instructed pt to hold to counter, couch, sturdy surface when performing exercises at home). At end of session pt left sitting in w/c with wife present & all needs in reach.   Pt on 3L/min of oxygen at rest, increased to 4L/min with activity during session. SpO2 = 100%, HR = 86 bpm  after ambulating 100 ft.  PT Discharge Precautions/Restrictions Precautions Precautions: Fall Precaution Comments: 3-4L O2 [place] baseline Restrictions Weight Bearing Restrictions: No  Vision/Perception  Pt wears glasses for reading only at baseline. No changes in baseline & no apparent visual deficits.   Cognition Orientation Level: Oriented X4   Sensation Coordination Gross Motor Movements are Fluid and Coordinated: Yes  Motor  Motor Motor - Discharge Observations: generalized deconditioning   Mobility Bed Mobility Bed Mobility: Rolling Right;Rolling Left;Supine to Sit;Sit to Supine Rolling Right: Supervision/verbal cueing Rolling Left: Supervision/Verbal cueing Supine to Sit: Supervision/Verbal cueing Sit to Supine: Supervision/Verbal cueing Transfers Transfers: Sit to Stand;Stand to Sit Sit to Stand: Supervision/Verbal cueing Stand to Sit: Supervision/Verbal cueing  Locomotion  Gait Ambulation: Yes Gait Assistance: Supervision/Verbal cueing Gait Distance (Feet): 100 Feet Assistive device: Rolling walker Gait Gait: Yes Gait Pattern: Impaired Gait Pattern: (forward trunk flexion ) Gait velocity: decreased Stairs / Additional Locomotion Stairs: Yes Stairs Assistance: Supervision/Verbal cueing Stair Management Technique: Wheelchair Number of Stairs: 1 Height of Stairs: 6(inches) Ramp: Supervision/Verbal cueing(ambulatory with RW) Curb: Supervision/Verbal cueing(RW) Product manager Mobility: Yes Wheelchair Assistance: Chartered loss adjuster: Both upper extremities;Both lower extermities Wheelchair Parts Management: Needs assistance Distance: 75 ft    Trunk/Postural Assessment  Postural Control Postural Control: Deficits on evaluation Righting Reactions: delayed Protective Responses: delayed   Balance Balance Balance Assessed: No Dynamic Standing Balance Dynamic Standing - Balance Support: During functional  activity;Bilateral upper extremity supported Dynamic Standing - Level of Assistance: 5: Stand by assistance Dynamic Standing - Balance Activities: (during gait)   Extremity Assessment  RUE Assessment RUE Assessment: Within Functional Limits LUE Assessment LUE  Assessment: Within Functional Limits RLE Assessment RLE Assessment: Within Functional Limits LLE Assessment LLE Assessment: Within Functional Limits    Waunita Schooner 01/24/2019, 1:09 PM

## 2019-01-24 NOTE — Progress Notes (Addendum)
[place] PHYSICAL MEDICINE & REHABILITATION PROGRESS NOTE   Subjective/Complaints: Patient seen sitting up in bed this morning.  He states he slept well overnight.  He states he had a good weekend.  Due to the new policy regarding visitor instructions, patient states he would like to go home as his wife cannot stay here.  Discussed with therapies for appropriateness of discharge; informed patient will follow-up.  ROS: Denies CP, SOB, N/V/D   Objective:   No results found. No results for input(s): WBC, HGB, HCT, PLT in the last 72 hours. No results for input(s): NA, K, CL, CO2, GLUCOSE, BUN, CREATININE, CALCIUM in the last 72 hours.  Intake/Output Summary (Last 24 hours) at 01/24/2019 0831 Last data filed at 01/24/2019 0643 Gross per 24 hour  Intake 720 ml  Output 1100 ml  Net -380 ml     Physical Exam: Vital Signs Blood pressure 114/60, pulse 74, temperature 99.1 F (37.3 C), temperature source Oral, resp. rate 18, weight 115.8 kg, SpO2 100 %. Constitutional: No distress . Vital signs reviewed. HENT: Normocephalic.  Atraumatic. Eyes: EOMI. No discharge. Cardiovascular: RRR.  No JVD. Respiratory: CTA bilaterally.  Normal effort. GI: BS +. Non-distended. Musc: No edema or tenderness in extremities. Neurological: Alert and oriented.     Motor:4+/5 grossly throughout Psych: Normal mood.  Normal behavior.  Assessment/Plan: 1. Functional deficits secondary to debility/encephalopathy which require 3+ hours per day of interdisciplinary therapy in a comprehensive inpatient rehab setting.  Physiatrist is providing close team supervision and 24 hour management of active medical problems listed below.  Physiatrist and rehab team continue to assess barriers to discharge/monitor patient progress toward functional and medical goals  Care Tool:  Bathing    Body parts bathed by patient: Right arm, Left arm, Chest, Abdomen, Right upper leg, Left upper leg, Face, Front perineal area,  Buttocks   Body parts bathed by helper: Right lower leg, Left lower leg     Bathing assist Assist Level: Minimal Assistance - Patient > 75%     Upper Body Dressing/Undressing Upper body dressing   What is the patient wearing?: Pull over shirt    Upper body assist Assist Level: Set up assist    Lower Body Dressing/Undressing Lower body dressing      What is the patient wearing?: Pants     Lower body assist Assist for lower body dressing: Supervision/Verbal cueing     Toileting Toileting    Toileting assist Assist for toileting: Minimal Assistance - Patient > 75%     Transfers Chair/bed transfer  Transfers assist  Chair/bed transfer activity did not occur: Safety/medical concerns  Chair/bed transfer assist level: Contact Guard/Touching assist     Locomotion Ambulation   Ambulation assist      Assist level: Supervision/Verbal cueing Assistive device: Walker-rolling Max distance: 150   Walk 10 feet activity   Assist     Assist level: Supervision/Verbal cueing Assistive device: Walker-rolling   Walk 50 feet activity   Assist Walk 50 feet with 2 turns activity did not occur: Safety/medical concerns(fatigue )  Assist level: Supervision/Verbal cueing Assistive device: Walker-rolling    Walk 150 feet activity   Assist Walk 150 feet activity did not occur: Safety/medical concerns(fatigue )  Assist level: Supervision/Verbal cueing Assistive device: Walker-rolling    Walk 10 feet on uneven surface  activity   Assist Walk 10 feet on uneven surfaces activity did not occur: Safety/medical concerns(fatigue )         Wheelchair     Assist  Will patient use wheelchair at discharge?: Yes Type of Wheelchair: Manual    Wheelchair assist level: Supervision/Verbal cueing Max wheelchair distance: 46'    Wheelchair 50 feet with 2 turns activity    Assist        Assist Level: Supervision/Verbal cueing   Wheelchair 150 feet activity      Assist Wheelchair 150 feet activity did not occur: Safety/medical concerns(fatigue )        Medical Problem List and Plan: 1.Decreased functional mobilitysecondary to acute on chronic hypoxic respiratory failure complicatedby toxicencephalopathy   Continue CIR for the time being, originally scheduled for discharge on 3/25, will follow up with therapies and evaluate for appropriateness for discharge today given change in circumstances.  Weekend notes reviewed.  Continue steroid taper, decreased again on 3/18, DC'd on 3/21 2. Antithrombotics: -DVT/anticoagulation:Xarelto -antiplatelet therapy: n/a 3. Pain Management:Neurontin 600 mg twice a day, oxycodone as needed 4. Mood:Provide emotional support -antipsychotic agents: None 5. Neuropsych: This patientiscapable of making decisions on hisown behalf. 6. Skin/Wound Care:Routine skin checks 7. Fluids/Electrolytes/Nutrition:Routine and out's 8. PAF. Cardiac rate controlled. Follow cardiology services. Continue Xarelto 9. Hypertension. Zebeta 2.5 mg daily,Bidil 20-37.5 mg 3 times a day  Controlled on 3/23 10.Acute on chronic systolic congestive heart failure.   Demadex 80 mg twice a day---resumed 3/14 by cardiology    Monitor for any signs of fluid overload Filed Weights   01/22/19 0409 01/23/19 0344 01/24/19 0414  Weight: 113.8 kg 114.6 kg 115.8 kg   ?  Reliability on 3/23 11. Tobacco abuse/COPD. Patient on 3 L oxygen prior to admission. Continue low-dose prednisone. Nebulizer treatments every 4 hours as needed 12.  Diabetes mellitus.   -cbg's remain labile  Levemir increased to 20u on 3/15  NovoLog 4 units 3 times daily started on 3/17, increased to 6 units on 3/23  No metformin due to Cr   Monitor with steroid taper 13. Hyperlipidemia. Crestor 14. Acute on chronic anemia  Hemoglobin 8.3 on 3/20  Continue to monitor 15.  Hypokalemia  Potassium 4.1 on 3/20  Labs  pending  Supplement increased on 3/16, decreased on 3/20  Hypomagnesemia-supplement initiated  16.  AKI  Creatinine 1.30 on 3/20  Encourage fluids  Continue to monitor 17.  Leukocytosis- likely steroid-induced: Resolved  WBCs 9.5 on 3/20   Afebrile  Continue to monitor  18. Right hip OA- chronic, requiring surgery pt pt  Voltaren gel ordered for some tenderness along trochanteric bursa  Improving  Addendum: Plan for discharge today. >30 minutes spent in total in coordinating discharge with therapies, social worker, patient.  Please see discharge summary as well.  LOS: 10 days A FACE TO FACE EVALUATION WAS PERFORMED  Ankit Lorie Phenix 01/24/2019, 8:31 AM

## 2019-01-24 NOTE — Progress Notes (Signed)
Occupational Therapy Discharge Summary  Patient Details  Name: Ruthvik Barnaby MRN: [id number] Date of Birth: 24-May-1946  Patient has met 10 of 10 long term goals due to improved activity tolerance, improved balance, postural control and ability to compensate for deficits.  Patient to discharge at overall Supervision level.  Patient's care partner is independent to provide the necessary physical assistance at discharge.    Reasons goals not met: n/a   Recommendation:  Patient will benefit from ongoing skilled OT services in home health setting to continue to advance functional skills in the area of BADL.  Equipment: No equipment provided  Reasons for discharge: treatment goals met and discharge from hospital  Patient/family agrees with progress made and goals achieved: Yes  OT Discharge Precautions/Restrictions  Precautions Precautions: Fall Precaution Comments: 3-4L O2 [place] baseline Restrictions Weight Bearing Restrictions: No Pain Pain Assessment Pain Scale: 0-10 Pain Score: 5  Pain Type: Chronic pain Pain Location: Hip Pain Orientation: Right Pain Descriptors / Indicators: Aching Pain Frequency: Intermittent Pain Onset: On-going Patients Stated Pain Goal: 3 Pain Intervention(s): Medication (See eMAR) ADL ADL Eating: Independent Where Assessed-Eating: Chair Grooming: Modified independent Where Assessed-Grooming: Wheelchair Upper Body Bathing: Supervision/safety Where Assessed-Upper Body Bathing: Sitting at sink Lower Body Bathing: Supervision/safety Where Assessed-Lower Body Bathing: Sitting at sink, Standing at sink Upper Body Dressing: Supervision/safety Where Assessed-Upper Body Dressing: Sitting at sink Lower Body Dressing: Supervision/safety Where Assessed-Lower Body Dressing: Sitting at sink Toileting: Supervision/safety Where Assessed-Toileting: Glass blower/designer: Close supervision Toilet Transfer Method: Arts development officer: Grab  bars, Geophysical data processor: Close supervision Social research officer, government Method: Radiographer, therapeutic: Civil engineer, contracting with back, Engineer, drilling: Within Functional Limits Praxis Praxis: Intact Cognition Overall Cognitive Status: Within Functional Limits for tasks assessed Arousal/Alertness: Awake/alert Orientation Level: Oriented X4 Sustained Attention: Appears intact Memory: Appears intact Sensation Sensation Hot/Cold: Appears Intact Proprioception: Appears Intact Coordination Gross Motor Movements are Fluid and Coordinated: Yes Fine Motor Movements are Fluid and Coordinated: Yes Finger Nose Finger Test: slow Motor  Motor Motor: Within Functional Limits Motor - Discharge Observations: generalized deconditioning Mobility  Bed Mobility Bed Mobility: Rolling Right;Rolling Left;Supine to Sit;Sit to Supine Rolling Right: Supervision/verbal cueing Rolling Left: Supervision/Verbal cueing Supine to Sit: Supervision/Verbal cueing Sit to Supine: Supervision/Verbal cueing Transfers Sit to Stand: Supervision/Verbal cueing Stand to Sit: Supervision/Verbal cueing  Trunk/Postural Assessment  Postural Control Postural Control: Deficits on evaluation Righting Reactions: delayed Protective Responses: delayed  Balance Balance Balance Assessed: No Static Sitting Balance Static Sitting - Level of Assistance: 7: Independent Dynamic Sitting Balance Dynamic Sitting - Balance Support: During functional activity Dynamic Sitting - Level of Assistance: 7: Independent Static Standing Balance Static Standing - Balance Support: During functional activity Static Standing - Level of Assistance: 5: Stand by assistance Dynamic Standing Balance Dynamic Standing - Balance Support: During functional activity Dynamic Standing - Level of Assistance: 5: Stand by assistance Dynamic Standing - Balance Activities: (during gait) Extremity/Trunk  Assessment RUE Assessment RUE Assessment: Within Functional Limits LUE Assessment LUE Assessment: Within Functional Limits   Daneen Schick Mea Ozga 01/24/2019, 2:01 PM

## 2019-01-25 ENCOUNTER — Telehealth: Payer: Self-pay | Admitting: Registered Nurse

## 2019-01-25 ENCOUNTER — Other Ambulatory Visit: Payer: Self-pay

## 2019-01-25 ENCOUNTER — Inpatient Hospital Stay (HOSPITAL_COMMUNITY): Payer: Medicare Other | Admitting: Physical Therapy

## 2019-01-25 ENCOUNTER — Telehealth (HOSPITAL_COMMUNITY): Payer: Self-pay

## 2019-01-25 ENCOUNTER — Encounter: Payer: Medicare Other | Admitting: Registered Nurse

## 2019-01-25 NOTE — Telephone Encounter (Signed)
Placed a call to Nicholas Caldwell for a virtual visit, no answer left message to return the call. He was discharged from [facility] on 01/24/2019. Awaiting a return call.

## 2019-01-25 NOTE — Telephone Encounter (Signed)
Attempted to contact pt regarding referral to [facility] program. No answer, I did leave VM for him to return my call.   Marylouise Stacks, EMT-Paramedic  01/25/19

## 2019-01-26 ENCOUNTER — Telehealth: Payer: Self-pay | Admitting: *Deleted

## 2019-01-26 ENCOUNTER — Telehealth: Payer: Self-pay | Admitting: Registered Nurse

## 2019-01-26 DIAGNOSIS — M5136 Other intervertebral disc degeneration, lumbar region: Secondary | ICD-10-CM | POA: Diagnosis not present

## 2019-01-26 DIAGNOSIS — G894 Chronic pain syndrome: Secondary | ICD-10-CM | POA: Diagnosis not present

## 2019-01-26 DIAGNOSIS — M47816 Spondylosis without myelopathy or radiculopathy, lumbar region: Secondary | ICD-10-CM | POA: Diagnosis not present

## 2019-01-26 DIAGNOSIS — Z79891 Long term (current) use of opiate analgesic: Secondary | ICD-10-CM | POA: Diagnosis not present

## 2019-01-26 NOTE — Telephone Encounter (Signed)
Daughter Nicholas Caldwell returned our call over concern about inability to reach Nicholas Caldwell and she is stating that she has been unable to reach her father as well.  It has been several days since she talked with him.  She is in [place].  I have let her know that we cannot initiate Wellness Check with police but family can.  She says he has done this type behavior before, not answering door or phone,  but she may call them if another couple of days go by. She has tried to reinforce with him and his wife how worrying this is for her being so far away.

## 2019-01-26 NOTE — Telephone Encounter (Signed)
2nd call place for Virtual TC call, Called Nicholas Caldwell and [name], left message on voicemail.

## 2019-01-28 ENCOUNTER — Telehealth (HOSPITAL_COMMUNITY): Payer: Self-pay | Admitting: Cardiology

## 2019-01-28 NOTE — Telephone Encounter (Signed)
Patient called to schedule HFU as instructed at discharge  HFU scheduled with Dr [name] 3/31  Patient requested TELEPHONE visit only

## 2019-02-01 ENCOUNTER — Ambulatory Visit (HOSPITAL_COMMUNITY)
Admission: RE | Admit: 2019-02-01 | Discharge: 2019-02-01 | Disposition: A | Discharge status: Home / Self Care | Payer: Medicare Other | Source: Ambulatory Visit | Attending: Cardiology | Admitting: Cardiology

## 2019-02-01 ENCOUNTER — Other Ambulatory Visit: Payer: Self-pay

## 2019-02-01 DIAGNOSIS — I5022 Chronic systolic (congestive) heart failure: Secondary | ICD-10-CM

## 2019-02-01 DIAGNOSIS — Z79899 Other long term (current) drug therapy: Secondary | ICD-10-CM

## 2019-02-01 DIAGNOSIS — J449 Chronic obstructive pulmonary disease, unspecified: Secondary | ICD-10-CM | POA: Diagnosis not present

## 2019-02-01 DIAGNOSIS — I480 Paroxysmal atrial fibrillation: Secondary | ICD-10-CM

## 2019-02-01 DIAGNOSIS — Z7901 Long term (current) use of anticoagulants: Secondary | ICD-10-CM | POA: Diagnosis not present

## 2019-02-01 MED ORDER — METOLAZONE 2.5 MG PO TABS: 2.5000 mg | ORAL_TABLET | ORAL | 3 refills | Status: DC
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
## 2019-02-02 ENCOUNTER — Telehealth (HOSPITAL_COMMUNITY): Payer: Self-pay | Admitting: *Deleted

## 2019-02-02 DIAGNOSIS — F17200 Nicotine dependence, unspecified, uncomplicated: Secondary | ICD-10-CM | POA: Diagnosis not present

## 2019-02-02 DIAGNOSIS — E1122 Type 2 diabetes mellitus with diabetic chronic kidney disease: Secondary | ICD-10-CM | POA: Diagnosis not present

## 2019-02-02 DIAGNOSIS — T380X5D Adverse effect of glucocorticoids and synthetic analogues, subsequent encounter: Secondary | ICD-10-CM | POA: Diagnosis not present

## 2019-02-02 DIAGNOSIS — I700 Atherosclerosis of aorta: Secondary | ICD-10-CM | POA: Diagnosis not present

## 2019-02-02 DIAGNOSIS — Z794 Long term (current) use of insulin: Secondary | ICD-10-CM | POA: Diagnosis not present

## 2019-02-02 DIAGNOSIS — I2510 Atherosclerotic heart disease of native coronary artery without angina pectoris: Secondary | ICD-10-CM | POA: Diagnosis not present

## 2019-02-02 DIAGNOSIS — I130 Hypertensive heart and chronic kidney disease with heart failure and stage 1 through stage 4 chronic kidney disease, or unspecified chronic kidney disease: Secondary | ICD-10-CM | POA: Diagnosis not present

## 2019-02-02 DIAGNOSIS — J9621 Acute and chronic respiratory failure with hypoxia: Secondary | ICD-10-CM | POA: Diagnosis not present

## 2019-02-02 DIAGNOSIS — I428 Other cardiomyopathies: Secondary | ICD-10-CM | POA: Diagnosis not present

## 2019-02-02 DIAGNOSIS — J432 Centrilobular emphysema: Secondary | ICD-10-CM | POA: Diagnosis not present

## 2019-02-02 DIAGNOSIS — I4892 Unspecified atrial flutter: Secondary | ICD-10-CM | POA: Diagnosis not present

## 2019-02-02 DIAGNOSIS — M1611 Unilateral primary osteoarthritis, right hip: Secondary | ICD-10-CM | POA: Diagnosis not present

## 2019-02-02 DIAGNOSIS — I272 Pulmonary hypertension, unspecified: Secondary | ICD-10-CM | POA: Diagnosis not present

## 2019-02-02 DIAGNOSIS — E1142 Type 2 diabetes mellitus with diabetic polyneuropathy: Secondary | ICD-10-CM | POA: Diagnosis not present

## 2019-02-02 DIAGNOSIS — N182 Chronic kidney disease, stage 2 (mild): Secondary | ICD-10-CM | POA: Diagnosis not present

## 2019-02-02 DIAGNOSIS — D631 Anemia in chronic kidney disease: Secondary | ICD-10-CM | POA: Diagnosis not present

## 2019-02-02 DIAGNOSIS — Z7901 Long term (current) use of anticoagulants: Secondary | ICD-10-CM | POA: Diagnosis not present

## 2019-02-02 DIAGNOSIS — I5043 Acute on chronic combined systolic (congestive) and diastolic (congestive) heart failure: Secondary | ICD-10-CM | POA: Diagnosis not present

## 2019-02-02 DIAGNOSIS — J431 Panlobular emphysema: Secondary | ICD-10-CM | POA: Diagnosis not present

## 2019-02-02 DIAGNOSIS — E1169 Type 2 diabetes mellitus with other specified complication: Secondary | ICD-10-CM | POA: Diagnosis not present

## 2019-02-02 DIAGNOSIS — Z9981 Dependence on supplemental oxygen: Secondary | ICD-10-CM | POA: Diagnosis not present

## 2019-02-02 DIAGNOSIS — J9622 Acute and chronic respiratory failure with hypercapnia: Secondary | ICD-10-CM | POA: Diagnosis not present

## 2019-02-02 DIAGNOSIS — E1165 Type 2 diabetes mellitus with hyperglycemia: Secondary | ICD-10-CM | POA: Diagnosis not present

## 2019-02-02 DIAGNOSIS — I480 Paroxysmal atrial fibrillation: Secondary | ICD-10-CM | POA: Diagnosis not present

## 2019-02-02 NOTE — Telephone Encounter (Signed)
HHRN from [facility] called to report she had just seen pt, his wt was 262 lbs today, BP 122/58 and she drew his labs, will send info to Dr [name]

## 2019-02-07 ENCOUNTER — Telehealth (HOSPITAL_COMMUNITY): Payer: Self-pay | Admitting: Licensed Clinical Social Worker

## 2019-02-07 NOTE — Telephone Encounter (Signed)
CSW reached out to pt to check in regarding food and medication status at this time.  Unable to reach pt- left message encouraging pt to reach out with any concerns   Nicholas Caldwell, [place] Worker [facility] [phone]

## 2019-02-08 ENCOUNTER — Telehealth (HOSPITAL_COMMUNITY): Payer: Self-pay

## 2019-02-08 ENCOUNTER — Telehealth (HOSPITAL_COMMUNITY): Payer: Self-pay | Admitting: *Deleted

## 2019-02-08 NOTE — Telephone Encounter (Signed)
[facility] called to let us know she has not been able to get in touch with pt, she states she has called several times and went to his house twice, she states today there were 4 vehicles in the driveway but no one would come to the door and she couldn't see any one inside from looking.  She just wanted [place] to be aware that she was going to document as a missed visit.  Advised I myself had called pt a couple of times over the past week to sch next appt and our [place] our [place] has also been unable to reach pt.  I again called pt and left message for him to call us back:  We need to sch a f/u appt for this week and see if he is agreeable to have Katie w/paramedicine start following him as he has not answered her calls either.

## 2019-02-08 NOTE — Telephone Encounter (Signed)
Heather, RN from [facility] called me and advised she finally got in touch with pt and he agreed to a home visit if I wore all PPE during our encounter.  He agreed to answer when I called to sch home visit. I tried calling pt again today however no answer. Left message asking him to return my call.   Marylouise Stacks, EMT -Paramedic  02/08/19

## 2019-02-08 NOTE — Telephone Encounter (Signed)
Finally spoke w/pt, he states he was in the shower when nurse came by.  He of course does not want to come into our office for an appt due to Covid-19 but is agreeable to letting paramedics into home.  He is aware that Joellen [place] will call him to sch a visit.  Joellen [place] is aware and will call pt to sch a time she go out today, she will let us know when that will be so we can do a telehealth call while she is with pt.

## 2019-02-16 ENCOUNTER — Telehealth (HOSPITAL_COMMUNITY): Payer: Self-pay

## 2019-02-16 ENCOUNTER — Telehealth (HOSPITAL_COMMUNITY): Payer: Self-pay | Admitting: *Deleted

## 2019-02-16 NOTE — Telephone Encounter (Signed)
Nicholas Caldwell with [facility] called to report patient would not come to the door for visit. This is their 3rd attempt to see patient and he has not opened the door. She said there are 4 cars in the driveway but she cant get anyone to answer the door. She has cancelled todays visit.

## 2019-02-16 NOTE — Telephone Encounter (Signed)
Contacted pt attempting to schedule home visit, unable to reach pt since paramedicine referral. I left message for him to return call again.   Marylouise Stacks, EMT-Paramedic  02/16/19

## 2019-02-21 ENCOUNTER — Telehealth (HOSPITAL_COMMUNITY): Payer: Self-pay

## 2019-02-21 NOTE — Telephone Encounter (Signed)
Another unsuccessful attempt to reach pt since paramedicine referral. It looks like home health has been having same issues.  Due to lack of being able to reach him, he will be d/c from paramedicine.   Marylouise Stacks, EMT-Paramedic  02/21/19

## 2019-03-06 ENCOUNTER — Encounter (HOSPITAL_COMMUNITY): Payer: Self-pay | Admitting: *Deleted

## 2019-03-06 ENCOUNTER — Inpatient Hospital Stay (HOSPITAL_COMMUNITY)
Admission: EM | Admit: 2019-03-06 | Discharge: 2019-03-31 | DRG: 291 | Disposition: A | Discharge status: Home Health | Payer: Medicare Other | Attending: Internal Medicine | Admitting: Internal Medicine

## 2019-03-06 ENCOUNTER — Emergency Department (HOSPITAL_COMMUNITY): Payer: Medicare Other

## 2019-03-06 ENCOUNTER — Other Ambulatory Visit: Payer: Self-pay

## 2019-03-06 DIAGNOSIS — J9622 Acute and chronic respiratory failure with hypercapnia: Secondary | ICD-10-CM | POA: Diagnosis not present

## 2019-03-06 DIAGNOSIS — R32 Unspecified urinary incontinence: Secondary | ICD-10-CM | POA: Diagnosis not present

## 2019-03-06 DIAGNOSIS — E669 Obesity, unspecified: Secondary | ICD-10-CM | POA: Diagnosis not present

## 2019-03-06 DIAGNOSIS — N179 Acute kidney failure, unspecified: Secondary | ICD-10-CM | POA: Diagnosis not present

## 2019-03-06 DIAGNOSIS — J431 Panlobular emphysema: Secondary | ICD-10-CM | POA: Diagnosis present

## 2019-03-06 DIAGNOSIS — D509 Iron deficiency anemia, unspecified: Secondary | ICD-10-CM | POA: Diagnosis not present

## 2019-03-06 DIAGNOSIS — Y92230 Patient room in hospital as the place of occurrence of the external cause: Secondary | ICD-10-CM | POA: Diagnosis not present

## 2019-03-06 DIAGNOSIS — Z9981 Dependence on supplemental oxygen: Secondary | ICD-10-CM

## 2019-03-06 DIAGNOSIS — E1140 Type 2 diabetes mellitus with diabetic neuropathy, unspecified: Secondary | ICD-10-CM

## 2019-03-06 DIAGNOSIS — I4891 Unspecified atrial fibrillation: Secondary | ICD-10-CM

## 2019-03-06 DIAGNOSIS — I130 Hypertensive heart and chronic kidney disease with heart failure and stage 1 through stage 4 chronic kidney disease, or unspecified chronic kidney disease: Principal | ICD-10-CM | POA: Diagnosis present

## 2019-03-06 DIAGNOSIS — Z791 Long term (current) use of non-steroidal anti-inflammatories (NSAID): Secondary | ICD-10-CM

## 2019-03-06 DIAGNOSIS — E1165 Type 2 diabetes mellitus with hyperglycemia: Secondary | ICD-10-CM | POA: Diagnosis not present

## 2019-03-06 DIAGNOSIS — R0600 Dyspnea, unspecified: Secondary | ICD-10-CM | POA: Diagnosis not present

## 2019-03-06 DIAGNOSIS — K254 Chronic or unspecified gastric ulcer with hemorrhage: Secondary | ICD-10-CM | POA: Diagnosis present

## 2019-03-06 DIAGNOSIS — K5730 Diverticulosis of large intestine without perforation or abscess without bleeding: Secondary | ICD-10-CM | POA: Diagnosis not present

## 2019-03-06 DIAGNOSIS — I472 Ventricular tachycardia: Secondary | ICD-10-CM | POA: Diagnosis not present

## 2019-03-06 DIAGNOSIS — M1611 Unilateral primary osteoarthritis, right hip: Secondary | ICD-10-CM | POA: Diagnosis present

## 2019-03-06 DIAGNOSIS — J189 Pneumonia, unspecified organism: Secondary | ICD-10-CM | POA: Diagnosis not present

## 2019-03-06 DIAGNOSIS — E1169 Type 2 diabetes mellitus with other specified complication: Secondary | ICD-10-CM | POA: Diagnosis not present

## 2019-03-06 DIAGNOSIS — R000 Tachycardia, unspecified: Secondary | ICD-10-CM | POA: Diagnosis not present

## 2019-03-06 DIAGNOSIS — K5900 Constipation, unspecified: Secondary | ICD-10-CM | POA: Diagnosis not present

## 2019-03-06 DIAGNOSIS — I5023 Acute on chronic systolic (congestive) heart failure: Secondary | ICD-10-CM | POA: Diagnosis present

## 2019-03-06 DIAGNOSIS — E662 Morbid (severe) obesity with alveolar hypoventilation: Secondary | ICD-10-CM | POA: Diagnosis present

## 2019-03-06 DIAGNOSIS — I484 Atypical atrial flutter: Secondary | ICD-10-CM | POA: Diagnosis present

## 2019-03-06 DIAGNOSIS — J181 Lobar pneumonia, unspecified organism: Secondary | ICD-10-CM | POA: Diagnosis not present

## 2019-03-06 DIAGNOSIS — Z794 Long term (current) use of insulin: Secondary | ICD-10-CM | POA: Diagnosis not present

## 2019-03-06 DIAGNOSIS — I272 Pulmonary hypertension, unspecified: Secondary | ICD-10-CM | POA: Diagnosis present

## 2019-03-06 DIAGNOSIS — N183 Chronic kidney disease, stage 3 (moderate): Secondary | ICD-10-CM | POA: Diagnosis present

## 2019-03-06 DIAGNOSIS — R0602 Shortness of breath: Secondary | ICD-10-CM | POA: Diagnosis not present

## 2019-03-06 DIAGNOSIS — R601 Generalized edema: Secondary | ICD-10-CM | POA: Diagnosis not present

## 2019-03-06 DIAGNOSIS — I509 Heart failure, unspecified: Secondary | ICD-10-CM | POA: Diagnosis not present

## 2019-03-06 DIAGNOSIS — I4819 Other persistent atrial fibrillation: Secondary | ICD-10-CM | POA: Diagnosis present

## 2019-03-06 DIAGNOSIS — I5043 Acute on chronic combined systolic (congestive) and diastolic (congestive) heart failure: Secondary | ICD-10-CM | POA: Diagnosis not present

## 2019-03-06 DIAGNOSIS — I480 Paroxysmal atrial fibrillation: Secondary | ICD-10-CM

## 2019-03-06 DIAGNOSIS — R05 Cough: Secondary | ICD-10-CM

## 2019-03-06 DIAGNOSIS — D500 Iron deficiency anemia secondary to blood loss (chronic): Secondary | ICD-10-CM | POA: Diagnosis not present

## 2019-03-06 DIAGNOSIS — R29700 NIHSS score 0: Secondary | ICD-10-CM | POA: Diagnosis not present

## 2019-03-06 DIAGNOSIS — E875 Hyperkalemia: Secondary | ICD-10-CM | POA: Diagnosis not present

## 2019-03-06 DIAGNOSIS — E785 Hyperlipidemia, unspecified: Secondary | ICD-10-CM | POA: Diagnosis present

## 2019-03-06 DIAGNOSIS — I2510 Atherosclerotic heart disease of native coronary artery without angina pectoris: Secondary | ICD-10-CM | POA: Diagnosis present

## 2019-03-06 DIAGNOSIS — Z20828 Contact with and (suspected) exposure to other viral communicable diseases: Secondary | ICD-10-CM | POA: Diagnosis present

## 2019-03-06 DIAGNOSIS — Y732 Prosthetic and other implants, materials and accessory gastroenterology and urology devices associated with adverse incidents: Secondary | ICD-10-CM | POA: Diagnosis not present

## 2019-03-06 DIAGNOSIS — J449 Chronic obstructive pulmonary disease, unspecified: Secondary | ICD-10-CM | POA: Diagnosis present

## 2019-03-06 DIAGNOSIS — I6340 Cerebral infarction due to embolism of unspecified cerebral artery: Secondary | ICD-10-CM | POA: Diagnosis not present

## 2019-03-06 DIAGNOSIS — J9621 Acute and chronic respiratory failure with hypoxia: Secondary | ICD-10-CM | POA: Diagnosis present

## 2019-03-06 DIAGNOSIS — T83518A Infection and inflammatory reaction due to other urinary catheter, initial encounter: Secondary | ICD-10-CM | POA: Diagnosis not present

## 2019-03-06 DIAGNOSIS — Z91199 Patient's noncompliance with other medical treatment and regimen due to unspecified reason: Secondary | ICD-10-CM

## 2019-03-06 DIAGNOSIS — Z6836 Body mass index (BMI) 36.0-36.9, adult: Secondary | ICD-10-CM

## 2019-03-06 DIAGNOSIS — I428 Other cardiomyopathies: Secondary | ICD-10-CM | POA: Diagnosis present

## 2019-03-06 DIAGNOSIS — J90 Pleural effusion, not elsewhere classified: Secondary | ICD-10-CM | POA: Diagnosis not present

## 2019-03-06 DIAGNOSIS — D649 Anemia, unspecified: Secondary | ICD-10-CM | POA: Diagnosis present

## 2019-03-06 DIAGNOSIS — G40909 Epilepsy, unspecified, not intractable, without status epilepticus: Secondary | ICD-10-CM | POA: Diagnosis not present

## 2019-03-06 DIAGNOSIS — Z9119 Patient's noncompliance with other medical treatment and regimen: Secondary | ICD-10-CM | POA: Diagnosis not present

## 2019-03-06 DIAGNOSIS — I110 Hypertensive heart disease with heart failure: Secondary | ICD-10-CM | POA: Diagnosis not present

## 2019-03-06 DIAGNOSIS — K2990 Gastroduodenitis, unspecified, without bleeding: Secondary | ICD-10-CM | POA: Diagnosis not present

## 2019-03-06 DIAGNOSIS — R070 Pain in throat: Secondary | ICD-10-CM | POA: Diagnosis not present

## 2019-03-06 DIAGNOSIS — Z8249 Family history of ischemic heart disease and other diseases of the circulatory system: Secondary | ICD-10-CM

## 2019-03-06 DIAGNOSIS — Z79891 Long term (current) use of opiate analgesic: Secondary | ICD-10-CM

## 2019-03-06 DIAGNOSIS — D473 Essential (hemorrhagic) thrombocythemia: Secondary | ICD-10-CM | POA: Diagnosis not present

## 2019-03-06 DIAGNOSIS — E1142 Type 2 diabetes mellitus with diabetic polyneuropathy: Secondary | ICD-10-CM | POA: Diagnosis present

## 2019-03-06 DIAGNOSIS — I4729 Other ventricular tachycardia: Secondary | ICD-10-CM

## 2019-03-06 DIAGNOSIS — Z9111 Patient's noncompliance with dietary regimen: Secondary | ICD-10-CM

## 2019-03-06 DIAGNOSIS — F17210 Nicotine dependence, cigarettes, uncomplicated: Secondary | ICD-10-CM | POA: Diagnosis not present

## 2019-03-06 DIAGNOSIS — I63412 Cerebral infarction due to embolism of left middle cerebral artery: Secondary | ICD-10-CM | POA: Diagnosis not present

## 2019-03-06 DIAGNOSIS — G9340 Encephalopathy, unspecified: Secondary | ICD-10-CM | POA: Diagnosis not present

## 2019-03-06 DIAGNOSIS — A4159 Other Gram-negative sepsis: Secondary | ICD-10-CM | POA: Diagnosis not present

## 2019-03-06 DIAGNOSIS — I10 Essential (primary) hypertension: Secondary | ICD-10-CM | POA: Diagnosis present

## 2019-03-06 DIAGNOSIS — I639 Cerebral infarction, unspecified: Secondary | ICD-10-CM | POA: Diagnosis not present

## 2019-03-06 DIAGNOSIS — G9341 Metabolic encephalopathy: Secondary | ICD-10-CM | POA: Diagnosis not present

## 2019-03-06 DIAGNOSIS — J9600 Acute respiratory failure, unspecified whether with hypoxia or hypercapnia: Secondary | ICD-10-CM | POA: Diagnosis not present

## 2019-03-06 DIAGNOSIS — E1122 Type 2 diabetes mellitus with diabetic chronic kidney disease: Secondary | ICD-10-CM | POA: Diagnosis present

## 2019-03-06 DIAGNOSIS — K3189 Other diseases of stomach and duodenum: Secondary | ICD-10-CM | POA: Diagnosis not present

## 2019-03-06 DIAGNOSIS — S30812A Abrasion of penis, initial encounter: Secondary | ICD-10-CM | POA: Diagnosis not present

## 2019-03-06 DIAGNOSIS — D62 Acute posthemorrhagic anemia: Secondary | ICD-10-CM | POA: Diagnosis not present

## 2019-03-06 DIAGNOSIS — K2970 Gastritis, unspecified, without bleeding: Secondary | ICD-10-CM | POA: Diagnosis not present

## 2019-03-06 DIAGNOSIS — K648 Other hemorrhoids: Secondary | ICD-10-CM | POA: Diagnosis present

## 2019-03-06 DIAGNOSIS — Z716 Tobacco abuse counseling: Secondary | ICD-10-CM | POA: Diagnosis not present

## 2019-03-06 DIAGNOSIS — N390 Urinary tract infection, site not specified: Secondary | ICD-10-CM | POA: Diagnosis not present

## 2019-03-06 DIAGNOSIS — R0689 Other abnormalities of breathing: Secondary | ICD-10-CM | POA: Diagnosis not present

## 2019-03-06 DIAGNOSIS — Y95 Nosocomial condition: Secondary | ICD-10-CM | POA: Diagnosis not present

## 2019-03-06 DIAGNOSIS — Z79899 Other long term (current) drug therapy: Secondary | ICD-10-CM

## 2019-03-06 DIAGNOSIS — R059 Cough, unspecified: Secondary | ICD-10-CM

## 2019-03-06 DIAGNOSIS — J42 Unspecified chronic bronchitis: Secondary | ICD-10-CM | POA: Diagnosis not present

## 2019-03-06 DIAGNOSIS — Z7901 Long term (current) use of anticoagulants: Secondary | ICD-10-CM

## 2019-03-06 DIAGNOSIS — K922 Gastrointestinal hemorrhage, unspecified: Secondary | ICD-10-CM | POA: Diagnosis not present

## 2019-03-06 DIAGNOSIS — K259 Gastric ulcer, unspecified as acute or chronic, without hemorrhage or perforation: Secondary | ICD-10-CM | POA: Diagnosis not present

## 2019-03-06 DIAGNOSIS — J9601 Acute respiratory failure with hypoxia: Secondary | ICD-10-CM | POA: Diagnosis not present

## 2019-03-06 DIAGNOSIS — R4182 Altered mental status, unspecified: Secondary | ICD-10-CM | POA: Diagnosis not present

## 2019-03-06 DIAGNOSIS — Z7951 Long term (current) use of inhaled steroids: Secondary | ICD-10-CM

## 2019-03-06 DIAGNOSIS — E876 Hypokalemia: Secondary | ICD-10-CM | POA: Diagnosis not present

## 2019-03-06 DIAGNOSIS — G894 Chronic pain syndrome: Secondary | ICD-10-CM | POA: Diagnosis present

## 2019-03-06 DIAGNOSIS — R5381 Other malaise: Secondary | ICD-10-CM | POA: Diagnosis not present

## 2019-03-06 DIAGNOSIS — Z9081 Acquired absence of spleen: Secondary | ICD-10-CM

## 2019-03-06 DIAGNOSIS — Z9114 Patient's other noncompliance with medication regimen: Secondary | ICD-10-CM

## 2019-03-06 DIAGNOSIS — I959 Hypotension, unspecified: Secondary | ICD-10-CM | POA: Diagnosis not present

## 2019-03-06 DIAGNOSIS — J441 Chronic obstructive pulmonary disease with (acute) exacerbation: Secondary | ICD-10-CM | POA: Diagnosis not present

## 2019-03-06 DIAGNOSIS — R198 Other specified symptoms and signs involving the digestive system and abdomen: Secondary | ICD-10-CM | POA: Diagnosis not present

## 2019-03-06 DIAGNOSIS — Z87891 Personal history of nicotine dependence: Secondary | ICD-10-CM

## 2019-03-06 DIAGNOSIS — J9602 Acute respiratory failure with hypercapnia: Secondary | ICD-10-CM | POA: Diagnosis not present

## 2019-03-06 DIAGNOSIS — I081 Rheumatic disorders of both mitral and tricuspid valves: Secondary | ICD-10-CM | POA: Diagnosis present

## 2019-03-06 LAB — HEMOGLOBIN AND HEMATOCRIT, BLOOD
HCT: 24.2 % — ABNORMAL LOW (ref 39.0–52.0)
Hemoglobin: 6.7 g/dL — CL (ref 13.0–17.0)

## 2019-03-06 LAB — AMMONIA: Ammonia: 24 umol/L (ref 9–35)

## 2019-03-06 LAB — RETICULOCYTES
Immature Retic Fract: 31.6 % — ABNORMAL HIGH (ref 2.3–15.9)
RBC.: 2.84 MIL/uL — ABNORMAL LOW (ref 4.22–5.81)
Retic Count, Absolute: 78.4 10*3/uL (ref 19.0–186.0)
Retic Ct Pct: 2.8 % (ref 0.4–3.1)

## 2019-03-06 LAB — URINALYSIS, ROUTINE W REFLEX MICROSCOPIC
Bilirubin Urine: NEGATIVE
Glucose, UA: NEGATIVE mg/dL
Hgb urine dipstick: NEGATIVE
Ketones, ur: NEGATIVE mg/dL
Leukocytes,Ua: NEGATIVE
Nitrite: NEGATIVE
Protein, ur: NEGATIVE mg/dL
Specific Gravity, Urine: 1.006 (ref 1.005–1.030)
pH: 5 (ref 5.0–8.0)

## 2019-03-06 LAB — CBC WITH DIFFERENTIAL/PLATELET
Abs Immature Granulocytes: 0.05 10*3/uL (ref 0.00–0.07)
Basophils Absolute: 0.1 10*3/uL (ref 0.0–0.1)
Basophils Relative: 1 %
Eosinophils Absolute: 0.1 10*3/uL (ref 0.0–0.5)
Eosinophils Relative: 1 %
HCT: 24.7 % — ABNORMAL LOW (ref 39.0–52.0)
Hemoglobin: 6.7 g/dL — CL (ref 13.0–17.0)
Immature Granulocytes: 1 %
Lymphocytes Relative: 8 %
Lymphs Abs: 0.8 10*3/uL (ref 0.7–4.0)
MCH: 23.5 pg — ABNORMAL LOW (ref 26.0–34.0)
MCHC: 27.1 g/dL — ABNORMAL LOW (ref 30.0–36.0)
MCV: 86.7 fL (ref 80.0–100.0)
Monocytes Absolute: 1.2 10*3/uL — ABNORMAL HIGH (ref 0.1–1.0)
Monocytes Relative: 11 %
Neutro Abs: 8.1 10*3/uL — ABNORMAL HIGH (ref 1.7–7.7)
Neutrophils Relative %: 78 %
Platelets: 625 10*3/uL — ABNORMAL HIGH (ref 150–400)
RBC: 2.85 MIL/uL — ABNORMAL LOW (ref 4.22–5.81)
RDW: 22.6 % — ABNORMAL HIGH (ref 11.5–15.5)
WBC: 10.3 10*3/uL (ref 4.0–10.5)
nRBC: 3.2 % — ABNORMAL HIGH (ref 0.0–0.2)

## 2019-03-06 LAB — COMPREHENSIVE METABOLIC PANEL
ALT: 12 U/L (ref 0–44)
AST: 16 U/L (ref 15–41)
Albumin: 3.2 g/dL — ABNORMAL LOW (ref 3.5–5.0)
Alkaline Phosphatase: 120 U/L (ref 38–126)
Anion gap: 12 (ref 5–15)
BUN: 18 mg/dL (ref 8–23)
CO2: 29 mmol/L (ref 22–32)
Calcium: 8.6 mg/dL — ABNORMAL LOW (ref 8.9–10.3)
Chloride: 95 mmol/L — ABNORMAL LOW (ref 98–111)
Creatinine, Ser: 1.12 mg/dL (ref 0.61–1.24)
GFR calc Af Amer: >60 mL/min (ref >60)
GFR calc non Af Amer: >60 mL/min (ref >60)
Glucose, Bld: 97 mg/dL (ref 70–99)
Potassium: 4.7 mmol/L (ref 3.5–5.1)
Sodium: 136 mmol/L (ref 135–145)
Total Bilirubin: 0.4 mg/dL (ref 0.3–1.2)
Total Protein: 7.4 g/dL (ref 6.5–8.1)

## 2019-03-06 LAB — CREATININE, URINE, RANDOM: Creatinine, Urine: 22.34 mg/dL

## 2019-03-06 LAB — POCT I-STAT EG7
Acid-Base Excess: 7 mmol/L — ABNORMAL HIGH (ref 0.0–2.0)
Bicarbonate: 33.1 mmol/L — ABNORMAL HIGH (ref 20.0–28.0)
Calcium, Ion: 1.07 mmol/L — ABNORMAL LOW (ref 1.15–1.40)
HCT: 25 % — ABNORMAL LOW (ref 39.0–52.0)
Hemoglobin: 8.5 g/dL — ABNORMAL LOW (ref 13.0–17.0)
O2 Saturation: 80 %
Potassium: 4.7 mmol/L (ref 3.5–5.1)
Sodium: 138 mmol/L (ref 135–145)
TCO2: 35 mmol/L — ABNORMAL HIGH (ref 22–32)
pCO2, Ven: 56.5 mmHg (ref 44.0–60.0)
pH, Ven: 7.376 (ref 7.250–7.430)
pO2, Ven: 46 mmHg — ABNORMAL HIGH (ref 32.0–45.0)

## 2019-03-06 LAB — IRON AND TIBC
Iron: 12 ug/dL — ABNORMAL LOW (ref 45–182)
Saturation Ratios: 3 % — ABNORMAL LOW (ref 17.9–39.5)
TIBC: 437 ug/dL (ref 250–450)
UIBC: 425 ug/dL

## 2019-03-06 LAB — TROPONIN I
Troponin I: 0.05 ng/mL (ref <0.03)
Troponin I: 0.04 ng/mL (ref <0.03)

## 2019-03-06 LAB — GLUCOSE, CAPILLARY: Glucose-Capillary: 79 mg/dL (ref 70–99)

## 2019-03-06 LAB — SODIUM, URINE, RANDOM: Sodium, Ur: 63 mmol/L

## 2019-03-06 LAB — PROCALCITONIN: Procalcitonin: <0.1 ng/mL

## 2019-03-06 LAB — LACTIC ACID, PLASMA
Lactic Acid, Venous: 1.6 mmol/L (ref 0.5–1.9)
Lactic Acid, Venous: 2.6 mmol/L (ref 0.5–1.9)

## 2019-03-06 LAB — SARS CORONAVIRUS 2 BY RT PCR (HOSPITAL ORDER, PERFORMED IN ~~LOC~~ HOSPITAL LAB): SARS Coronavirus 2: NEGATIVE

## 2019-03-06 LAB — VITAMIN B12: Vitamin B-12: 234 pg/mL (ref 180–914)

## 2019-03-06 LAB — FOLATE: Folate: 13 ng/mL (ref >5.9)

## 2019-03-06 LAB — BRAIN NATRIURETIC PEPTIDE: B Natriuretic Peptide: 1020.1 pg/mL — ABNORMAL HIGH (ref 0.0–100.0)

## 2019-03-06 LAB — FERRITIN: Ferritin: 8 ng/mL — ABNORMAL LOW (ref 24–336)

## 2019-03-06 LAB — PREPARE RBC (CROSSMATCH)

## 2019-03-06 MED ORDER — BISOPROLOL FUMARATE 5 MG PO TABS
2.5000 mg | ORAL_TABLET | Freq: Every day | ORAL | Status: DC
  Administered 2019-03-07 – 2019-03-31 (×25): 2.5 mg via ORAL
  Filled 2019-03-06 (×25): qty 1

## 2019-03-06 MED ORDER — PANTOPRAZOLE SODIUM 40 MG PO TBEC
40.0000 mg | DELAYED_RELEASE_TABLET | Freq: Two times a day (BID) | ORAL | Status: DC
  Administered 2019-03-07 – 2019-03-31 (×50): 40 mg via ORAL
  Filled 2019-03-06 (×51): qty 1

## 2019-03-06 MED ORDER — MOMETASONE FURO-FORMOTEROL FUM 200-5 MCG/ACT IN AERO
2.0000 | INHALATION_SPRAY | Freq: Two times a day (BID) | RESPIRATORY_TRACT | Status: DC
  Administered 2019-03-07 – 2019-03-09 (×4): 2 via RESPIRATORY_TRACT
  Filled 2019-03-06: qty 8.8

## 2019-03-06 MED ORDER — VANCOMYCIN HCL 10 G IV SOLR
2000.0000 mg | Freq: Once | INTRAVENOUS | Status: AC
  Administered 2019-03-06: 2000 mg via INTRAVENOUS
  Filled 2019-03-06: qty 2000

## 2019-03-06 MED ORDER — SODIUM CHLORIDE 0.9 % IV SOLN: 250.0000 mL | INTRAVENOUS | Status: DC | PRN

## 2019-03-06 MED ORDER — ONDANSETRON HCL 4 MG/2ML IJ SOLN
4.0000 mg | Freq: Four times a day (QID) | INTRAMUSCULAR | Status: DC | PRN
  Administered 2019-03-20: 4 mg via INTRAVENOUS
  Filled 2019-03-06: qty 2

## 2019-03-06 MED ORDER — SODIUM CHLORIDE 0.9% FLUSH
3.0000 mL | INTRAVENOUS | Status: DC | PRN
  Administered 2019-03-07: 3 mL via INTRAVENOUS
  Filled 2019-03-06: qty 3

## 2019-03-06 MED ORDER — ALBUTEROL SULFATE HFA 108 (90 BASE) MCG/ACT IN AERS
8.0000 | INHALATION_SPRAY | Freq: Once | RESPIRATORY_TRACT | Status: AC
  Administered 2019-03-06: 8 via RESPIRATORY_TRACT
  Filled 2019-03-06: qty 6.7

## 2019-03-06 MED ORDER — INSULIN ASPART 100 UNIT/ML ~~LOC~~ SOLN: 0.0000 [IU] | Freq: Three times a day (TID) | SUBCUTANEOUS | Status: DC

## 2019-03-06 MED ORDER — FOLIC ACID 1 MG PO TABS
1.0000 mg | ORAL_TABLET | Freq: Every day | ORAL | Status: DC
  Administered 2019-03-07 – 2019-03-31 (×25): 1 mg via ORAL
  Filled 2019-03-06 (×26): qty 1

## 2019-03-06 MED ORDER — POTASSIUM CHLORIDE CRYS ER 20 MEQ PO TBCR
20.0000 meq | EXTENDED_RELEASE_TABLET | Freq: Two times a day (BID) | ORAL | Status: DC
  Administered 2019-03-07 – 2019-03-09 (×7): 20 meq via ORAL
  Filled 2019-03-06 (×7): qty 1

## 2019-03-06 MED ORDER — FUROSEMIDE 10 MG/ML IJ SOLN
80.0000 mg | Freq: Two times a day (BID) | INTRAMUSCULAR | Status: DC
  Administered 2019-03-07 – 2019-03-08 (×3): 80 mg via INTRAVENOUS
  Filled 2019-03-06 (×3): qty 8

## 2019-03-06 MED ORDER — SODIUM CHLORIDE 0.9% FLUSH
3.0000 mL | Freq: Two times a day (BID) | INTRAVENOUS | Status: DC
  Administered 2019-03-07 – 2019-03-25 (×30): 3 mL via INTRAVENOUS

## 2019-03-06 MED ORDER — GUAIFENESIN 200 MG PO TABS
400.0000 mg | ORAL_TABLET | Freq: Four times a day (QID) | ORAL | Status: DC | PRN
  Administered 2019-03-13: 400 mg via ORAL
  Filled 2019-03-06 (×3): qty 2

## 2019-03-06 MED ORDER — GABAPENTIN 600 MG PO TABS
600.0000 mg | ORAL_TABLET | Freq: Two times a day (BID) | ORAL | Status: DC
  Administered 2019-03-07 – 2019-03-31 (×49): 600 mg via ORAL
  Filled 2019-03-06 (×50): qty 1

## 2019-03-06 MED ORDER — ISOSORB DINITRATE-HYDRALAZINE 20-37.5 MG PO TABS
1.0000 | ORAL_TABLET | Freq: Three times a day (TID) | ORAL | Status: DC
  Administered 2019-03-07 – 2019-03-23 (×49): 1 via ORAL
  Filled 2019-03-06 (×49): qty 1

## 2019-03-06 MED ORDER — SODIUM CHLORIDE 0.9% IV SOLUTION
Freq: Once | INTRAVENOUS | Status: AC
  Administered 2019-03-11: 12:00:00 via INTRAVENOUS

## 2019-03-06 MED ORDER — POLYETHYLENE GLYCOL 3350 17 G PO PACK
17.0000 g | PACK | Freq: Every day | ORAL | Status: DC
  Administered 2019-03-07 – 2019-03-31 (×10): 17 g via ORAL
  Filled 2019-03-06 (×21): qty 1

## 2019-03-06 MED ORDER — IPRATROPIUM-ALBUTEROL 0.5-2.5 (3) MG/3ML IN SOLN
RESPIRATORY_TRACT | Status: AC
  Administered 2019-03-06: 3 mL
  Filled 2019-03-06: qty 3

## 2019-03-06 MED ORDER — LATANOPROST 0.005 % OP SOLN
1.0000 [drp] | Freq: Every day | OPHTHALMIC | Status: DC
  Administered 2019-03-10 – 2019-03-30 (×19): 1 [drp] via OPHTHALMIC
  Filled 2019-03-06: qty 2.5

## 2019-03-06 MED ORDER — FLUTICASONE PROPIONATE 50 MCG/ACT NA SUSP
2.0000 | Freq: Every day | NASAL | Status: DC | PRN
  Filled 2019-03-06: qty 16

## 2019-03-06 MED ORDER — ACETAMINOPHEN 160 MG/5ML PO SOLN
650.0000 mg | Freq: Four times a day (QID) | ORAL | Status: DC | PRN
  Administered 2019-03-19 (×2): 650 mg via ORAL
  Filled 2019-03-06 (×3): qty 20.3

## 2019-03-06 MED ORDER — ROSUVASTATIN CALCIUM 20 MG PO TABS
20.0000 mg | ORAL_TABLET | Freq: Every day | ORAL | Status: DC
  Administered 2019-03-07 – 2019-03-30 (×24): 20 mg via ORAL
  Filled 2019-03-06 (×24): qty 1

## 2019-03-06 MED ORDER — FUROSEMIDE 10 MG/ML IJ SOLN
60.0000 mg | Freq: Once | INTRAMUSCULAR | Status: AC
  Administered 2019-03-07: 60 mg via INTRAVENOUS
  Filled 2019-03-06: qty 6

## 2019-03-06 MED ORDER — IPRATROPIUM-ALBUTEROL 0.5-2.5 (3) MG/3ML IN SOLN
3.0000 mL | Freq: Two times a day (BID) | RESPIRATORY_TRACT | Status: DC
  Administered 2019-03-06 – 2019-03-09 (×5): 3 mL via RESPIRATORY_TRACT
  Filled 2019-03-06 (×5): qty 3

## 2019-03-06 MED ORDER — INSULIN NPH (HUMAN) (ISOPHANE) 100 UNIT/ML ~~LOC~~ SUSP
10.0000 [IU] | Freq: Two times a day (BID) | SUBCUTANEOUS | Status: DC
  Administered 2019-03-07 – 2019-03-10 (×7): 10 [IU] via SUBCUTANEOUS
  Filled 2019-03-06 (×3): qty 10

## 2019-03-06 MED ORDER — FERROUS SULFATE 325 (65 FE) MG PO TABS
325.0000 mg | ORAL_TABLET | ORAL | Status: DC
  Administered 2019-03-07: 325 mg via ORAL
  Filled 2019-03-06: qty 1

## 2019-03-06 MED ORDER — METOLAZONE 2.5 MG PO TABS
2.5000 mg | ORAL_TABLET | Freq: Once | ORAL | Status: AC
  Administered 2019-03-07: 2.5 mg via ORAL
  Filled 2019-03-06: qty 1

## 2019-03-06 MED ORDER — OXYCODONE-ACETAMINOPHEN 5-325 MG PO TABS
2.0000 | ORAL_TABLET | Freq: Four times a day (QID) | ORAL | Status: DC | PRN
  Administered 2019-03-07 – 2019-03-22 (×47): 2 via ORAL
  Filled 2019-03-06 (×49): qty 2

## 2019-03-06 MED ORDER — ALBUTEROL SULFATE (2.5 MG/3ML) 0.083% IN NEBU: 5.0000 mg | INHALATION_SOLUTION | Freq: Once | RESPIRATORY_TRACT | Status: DC

## 2019-03-06 MED ORDER — SODIUM CHLORIDE 0.9 % IV SOLN
2.0000 g | Freq: Once | INTRAVENOUS | Status: AC
  Administered 2019-03-06: 2 g via INTRAVENOUS
  Filled 2019-03-06: qty 2

## 2019-03-06 NOTE — ED Provider Notes (Signed)
[facility] EMERGENCY DEPARTMENT Provider Note   CSN: [id number] Arrival date & time: 03/06/19  1745    History   Chief Complaint Chief Complaint  Patient presents with   Shortness of Breath    HPI Nicholas Caldwell is a 73 y.o. male.     HPI Presents with a complex past medical history of heart failure with reduced ejection fraction, atrial flutter currently on Xarelto, COPD who is noncompliant with his medications and presents to the emergency department for evaluation of week of worsening shortness of breath.  Wife at home states that the patient is noncompliant and does not use a CPAP at home nor does he use his inhalers.  She is unsure if he has been continuing to take his medications as directed.  No fevers at home that she is aware of patient believes he may have had a fever though he has not checked it.  No diarrhea, changes in bowel or bladder habits and patient denies chest pain at this time.  Patient complains of some pain in his right hip but denies any recent falls. Past Medical History:  Diagnosis Date   Atrial flutter ([place])    a. recurrent AFlutter with RVR;  b. Amiodarone Rx started 4/16   CAD (coronary artery disease)    a. LHC 1/16:  mLAD diffuse disease, pLCx mild disease, dLCx with disease but too small for PCI, RCA ok, EF 25-30%   Chronic pain    Chronic systolic CHF (congestive heart failure) (HCC)    COPD (chronic obstructive pulmonary disease) ([place])    Diabetes mellitus without complication ([place])    Hypercholesteremia    Hypertension    NICM (nonischemic cardiomyopathy) ([place])    a.dx 2016. b. 2D echo 06/2016 - Last echo 07/01/16: mod dilated LV, mod LVH, EF 25-30%, mild-mod MR, sev LAE, mild-mod reduced RV systolic function, mild-mod TR, PASP 67mHG.   PAF (paroxysmal atrial fibrillation) (HCC)    On amio - ot a candidate for flecainide due to cardiomyopathy, not a candidate for Tikosyn due to prolonged QT, and felt to be a poor  candidate for ablation given left atrial size.   Pulmonary hypertension (HCC)    Tobacco abuse     Patient Active Problem List   Diagnosis Date Noted   Diabetes mellitus type 2 in obese ([facility]    Primary osteoarthritis of right hip    Hypomagnesemia    Steroid-induced hyperglycemia    Diabetes mellitus type 2 in nonobese ([facility]    Supplemental oxygen dependent    Leukocytosis    Hypokalemia    Acute on chronic anemia    Diabetic peripheral neuropathy (HCC)    Acute on chronic systolic (congestive) heart failure (HCC)    Acute on chronic respiratory failure ([place] 01/14/2019   Hypoxia    Altered mental status    Heart failure ([place] 12/30/2018   AKI (acute kidney injury) ([place]    Acute respiratory failure ([place] 11/22/2018   Acute on chronic respiratory failure with hypoxia and hypercapnia (HCC) 029/52/8413  Metabolic encephalopathy 024/40/1027  Acute respiratory failure with hypoxia and hypercapnia (HCC) 07/26/2018   Acute exacerbation of CHF (congestive heart failure) ([facility] 07/26/2018   CHF exacerbation ([place] 05/01/2018   CHF (congestive heart failure) ([place] 04/30/2018   Chronic respiratory failure with hypoxia ([place] 12/28/2017   Atrial fibrillation with RVR (HCC)    SVT (supraventricular tachycardia) (HCC)    COPD GOLD 0    Medically noncompliant    Panlobular  emphysema (HCC)    OSA (obstructive sleep apnea)    Pulmonary hypertension (HCC)    Disorientation    Pressure injury of skin 09/07/2016   Acute on chronic combined systolic and diastolic CHF (congestive heart failure) ([place]) 09/05/2016   Skin lesion-left heal 47/34/0370   Chronic systolic CHF (congestive heart failure) ([place]) 07/16/2016   COPD (chronic obstructive pulmonary disease) ([place]) 07/16/2016   Uncontrolled type 2 diabetes mellitus with complication (HCC)    Diabetic polyneuropathy associated with diabetes mellitus due to underlying condition (HCC)    Normocytic anemia  06/29/2016   CAD - Non-obstructive by LHC1/16 01/21/2016   Nonischemic cardiomyopathy ([place]) 10/09/2015   PAF (paroxysmal atrial fibrillation) (HCC)    Chronic pain 02/19/2015   Morbid obesity ([place]) 02/13/2015   Dyspnea    Elevated troponin I level 11/01/2014   COPD exacerbation ([place]) 11/01/2014   Essential hypertension 10/31/2014   Type 2 diabetes mellitus with neuropathy 10/31/2014   Hyperlipidemia  10/31/2014   Cigarette smoker 10/31/2014    Past Surgical History:  Procedure Laterality Date   CARDIOVERSION N/A 07/30/2018   Procedure: CARDIOVERSION;  Surgeon: Larey Dresser, MD;  Location: [facility];  Service: Cardiovascular;  Laterality: N/A;   LEFT AND RIGHT HEART CATHETERIZATION WITH CORONARY ANGIOGRAM N/A 11/06/2014   Procedure: LEFT AND RIGHT HEART CATHETERIZATION WITH CORONARY ANGIOGRAM;  Surgeon: Jettie Booze, MD;  Location[facility] CATH LAB;  Service: Cardiovascular;  Laterality: N/A;   RIGHT/LEFT HEART CATH AND CORONARY ANGIOGRAPHY N/A 12/06/2018   Procedure: RIGHT/LEFT HEART CATH AND CORONARY ANGIOGRAPHY;  Surgeon: Larey Dresser, MD;  Location: [facility] CV LAB;  Service: Cardiovascular;  Laterality: N/A;   SPLENECTOMY          Home Medications    Prior to Admission medications   Medication Sig Start Date End Date Taking? Authorizing Provider  acetaminophen (TYLENOL) 160 MG/5ML solution Take 20.3 mLs (650 mg total) by mouth every 6 (six) hours as needed for mild pain, headache or fever. 01/24/19   Angiulli, Lavon Paganini, PA-C  bisoprolol (ZEBETA) 5 MG tablet Take 0.5 tablets (2.5 mg total) by mouth daily for 30 days. 01/24/19 02/23/19  Angiulli, Lavon Paganini, PA-C  budesonide-formoterol (SYMBICORT) 160-4.5 MCG/ACT inhaler Inhale 2 puffs into the lungs 2 (two) times daily.    [provider]  diclofenac sodium (VOLTAREN) 1 % GEL Apply 2 g topically 4 (four) times daily. 01/24/19   Angiulli, Lavon Paganini, PA-C  ferrous sulfate 325 (65 FE) MG tablet Take  325 mg by mouth 3 (three) times daily as needed (for supplementation).    [provider]  fluticasone (FLONASE) 50 MCG/ACT nasal spray Place 2 sprays into both nostrils daily as needed for allergies.     [provider]  folic acid (FOLVITE) 1 MG tablet Take 1 mg by mouth daily.    [provider]  gabapentin (NEURONTIN) 600 MG tablet Take 1 tablet (600 mg total) by mouth 2 (two) times daily. 01/24/19   Angiulli, Lavon Paganini, PA-C  guaifenesin (HUMIBID E) 400 MG TABS tablet Take 1 tablet (400 mg total) by mouth every 6 (six) hours as needed. Patient taking differently: Take 400 mg by mouth every 6 (six) hours as needed (cough).  08/02/18   Regalado, Belkys A, MD  hydrocortisone (ANUSOL-HC) 2.5 % rectal cream Place 1 application rectally 2 (two) times daily as needed for hemorrhoids or itching.     [provider]  hydrocortisone 1 % lotion Apply 1 application topically See admin instructions.  Apply small amount to back twice daily for itchy rash    [provider]  insulin aspart protamine - aspart (NOVOLOG MIX 70/30 FLEXPEN) (70-30) 100 UNIT/ML FlexPen Inject 0.05 mLs (5 Units total) into the skin 2 (two) times daily. Patient taking differently: Inject 30 Units into the skin 2 (two) times daily.  08/02/18   Regalado, Belkys A, MD  ipratropium-albuterol (DUONEB) 0.5-2.5 (3) MG/3ML SOLN Take 3 mLs by nebulization 2 (two) times daily.    [provider]  isosorbide-hydrALAZINE (BIDIL) 20-37.5 MG tablet Take 1 tablet by mouth 3 (three) times daily. 01/24/19   Angiulli, Lavon Paganini, PA-C  latanoprost (XALATAN) 0.005 % ophthalmic solution Place 1 drop into both eyes at bedtime.    [provider]  Magnesium Oxide 420 (252 Mg) MG TABS Take 420 mg by mouth 2 (two) times daily. 01/24/19   Angiulli, Lavon Paganini, PA-C  metFORMIN (GLUCOPHAGE-XR) 500 MG 24 hr tablet Take 1,000 mg by mouth 2 (two) times daily with a meal.    [provider]  metolazone  (ZAROXOLYN) 2.5 MG tablet Take 1 tablet (2.5 mg total) by mouth every Wednesday. Take extra Potassium 24mq (40 total, daily) when taking this medication 02/02/19 05/03/19  MLarey Dresser MD  Multiple Vitamin (MULTIVITAMIN WITH MINERALS) TABS tablet Take 1 tablet by mouth daily. 01/25/19   Angiulli, DLavon Paganini PA-C  oxyCODONE-acetaminophen (PERCOCET/ROXICET) 5-325 MG tablet Take 2 tablets by mouth every 6 (six) hours as needed for severe pain. 01/24/19   Angiulli, DLavon Paganini PA-C  OXYGEN Inhale 3 L into the lungs continuous.     [provider]  pantoprazole (PROTONIX) 40 MG tablet Take 1 tablet (40 mg total) by mouth 2 (two) times daily. 01/24/19   Angiulli, DLavon Paganini PA-C  polyethylene glycol (MIRALAX / GLYCOLAX) packet Take 17 g by mouth daily. 01/24/19   Angiulli, DLavon Paganini PA-C  potassium chloride SA (K-DUR,KLOR-CON) 20 MEQ tablet Take 2 tablets (40 mEq total) by mouth every morning AND 1 tablet (20 mEq total) at bedtime. Patient taking differently: Take 2 tablets (40 mEq total) by mouth every morning  AND 1 tablet (20 mEq total) at bedtime. 11/26/18   Amin, AJeanella Flattery MD  rivaroxaban (XARELTO) 20 MG TABS tablet Take 1 tablet (20 mg total) by mouth daily with supper. Patient taking differently: Take 20 mg by mouth daily at 6 PM.  01/20/17   VJettie Booze MD  rosuvastatin (CRESTOR) 20 MG tablet Take 1 tablet (20 mg total) by mouth daily. 01/24/19   Angiulli, DLavon Paganini PA-C  torsemide (DEMADEX) 20 MG tablet Take 4 tablets (80 mg total) by mouth 2 (two) times daily for 30 days. 01/24/19 02/23/19  Angiulli, DLavon Paganini PA-C    Family History Family History  Problem Relation Age of Onset   Heart disease Mother    Hypertension Mother    Heart failure Mother    Heart disease Father    Hypertension Sister    Heart attack Neg Hx    Stroke Neg Hx     Social History Social History   Tobacco Use   Smoking status: Former Smoker    Packs/day: 1.00    Years: 34.00    Pack years:  34.00    Types: Cigarettes    Last attempt to quit: 11/30/2018    Years since quitting: 0.2   Smokeless tobacco: Never Used  Substance Use Topics   Alcohol use: No    Alcohol/week: 0.0 standard drinks   Drug use:  No     Allergies   Patient has no known allergies.   Review of Systems Review of Systems  Constitutional: Positive for activity change, appetite change and fatigue. Negative for chills.  HENT: Negative for congestion, ear pain and sore throat.   Eyes: Negative for pain and visual disturbance.  Respiratory: Positive for shortness of breath. Negative for cough.   Cardiovascular: Positive for leg swelling. Negative for chest pain and palpitations.  Gastrointestinal: Negative for abdominal pain and vomiting.  Endocrine: Negative for polyphagia and polyuria.  Genitourinary: Negative for dysuria and hematuria.  Musculoskeletal: Negative for arthralgias, back pain, neck pain and neck stiffness.  Skin: Negative for color change and rash.  Allergic/Immunologic: Negative for immunocompromised state.  Neurological: Negative for seizures and syncope.  Hematological: Bruises/bleeds easily.  All other systems reviewed and are negative.    Physical Exam Updated Vital Signs BP (!) 117/55 (BP Location: Right Arm)    Pulse 98    Temp 98.3 F (36.8 C) (Oral)    Ht 6' (1.829 m)    Wt 120.2 kg    SpO2 96%    BMI 35.94 kg/m   Physical Exam Vitals signs and nursing note reviewed.  Constitutional:      Appearance: He is well-developed. He is ill-appearing.  HENT:     Head: Normocephalic and atraumatic.  Eyes:     Conjunctiva/sclera: Conjunctivae normal.  Neck:     Musculoskeletal: Neck supple.  Cardiovascular:     Rate and Rhythm: Normal rate and regular rhythm.     Heart sounds: No murmur.     Comments: Cold extremities, weak pulses Pulmonary:     Effort: Pulmonary effort is normal. No respiratory distress.     Breath sounds: Examination of the right-upper field reveals  decreased breath sounds. Examination of the left-upper field reveals decreased breath sounds. Examination of the right-middle field reveals decreased breath sounds. Examination of the left-middle field reveals decreased breath sounds. Decreased breath sounds present.  Chest:     Chest wall: No mass or tenderness.  Abdominal:     Palpations: Abdomen is soft.     Tenderness: There is no abdominal tenderness.  Musculoskeletal:     Right lower leg: Edema present.     Left lower leg: Edema present.  Skin:    General: Skin is warm and dry.  Neurological:     General: No focal deficit present.     Mental Status: He is alert.     Motor: No weakness.      ED Treatments / Results  Labs (all labs ordered are listed, but only abnormal results are displayed) Labs Reviewed - No data to display  EKG EKG Interpretation  Date/Time:  Sunday Mar 06 2019 17:50:14 EDT Ventricular Rate:  97 PR Interval:  160 QRS Duration: 98 QT Interval:  414 QTC Calculation: 525 R Axis:   89 Text Interpretation:  Atrial flutter Artifact ST-t wave abnormality Abnormal ekg Confirmed by Carmin Muskrat [phone]) on 03/06/2019 5:58:52 PM   Radiology No results found.  Procedures .Critical Care Performed by: Andee Poles, MD Authorized by: Andee Poles, MD   Critical care provider statement:    Critical care time (minutes):  30   Critical care was necessary to treat or prevent imminent or life-threatening deterioration of the following conditions:  Cardiac failure and respiratory failure   Critical care was time spent personally by me on the following activities:  Blood draw for specimens, development of treatment plan with patient or surrogate,  discussions with consultants, examination of patient, ordering and performing treatments and interventions, ordering and review of laboratory studies, pulse oximetry, ordering and review of radiographic studies, re-evaluation of patient's condition and review  of old charts   (including critical care time)  Medications Ordered in ED Medications - No data to display   Initial Impression / Assessment and Plan / ED Course  I have reviewed the triage vital signs and the nursing notes.  Pertinent labs & imaging results that were available during my care of the patient were reviewed by me and considered in my medical decision making (see chart for details).         Patient presents to the emergency department tachypneic and tachycardic with gross appearance of anasarca on examination.  Cold distal extremities in the setting of heart failure with reduced ejection fraction with initial concern for sepsis, heart failure exacerbation, COPD exacerbation, medication noncompliance, URI/COVID-19 infection.  Basic lab work shows acute anemia with a hemoglobin of 6.7 on recheck.  Given 8 puffs of albuterol with some improvement of shortness of breath.  Volume overload presents on full physical examination.  Furosemide bolus given to the patient.  1 unit of packed red blood cells ordered for the patient and he was consented at the bedside for this.  Antibiotics ordered for the patient initially however his condition does appear to be more fluid overload from medication noncompliance and acute heart failure exacerbation.  Patient taken to the medicine floor for further management. Final Clinical Impressions(s) / ED Diagnoses   Final diagnoses:  Acute anemia  Anasarca  Shortness of breath  Acute on chronic congestive heart failure, unspecified heart failure type [facility])    ED Discharge Orders    None       Andee Poles, MD 03/06/19 2300    Carmin Muskrat, MD 03/08/19 [phone]

## 2019-03-06 NOTE — H&P (Signed)
History and Physical    Nicholas Caldwell FGH:[id number] DOB: 1946-05-07 DOA: 03/06/2019  PCP: [facility]  Patient coming from: Home  I have personally briefly reviewed patient's old medical records in [place]  Chief Complaint: SOB  HPI: Nicholas Caldwell is a 73 y.o. male with medical history significant of combined systolic and diastolic heart failure, ejection fraction 40 to 45%, PAF on Xarelto, COPD noncompliant with inhalers, OSA doesn't use CPAP.  Patient presents to the ED with 1 week history of SOB.  Patient states he is compliant with his torsemide, wife states she doesn't know.  Patient with significant peripheral edema.  Thinks he might have had fever at home but hasnt checked it.  No diarrhea, no melena, no hematochezia.   ED Course: Pt satting 85% initially with tachypnea and tachycardia.  Got empiric doses of cefepime and vanc.  Patient with gross anasarca, BNP 1000, CXR is C/W pulmonary edema.  Patient breathing stabilized after neb treatments, oxygen, 13m IV lasix.  HR 124 in A.Fib.  WBC 10.3k, HGB 6.7.  1u PRBC transfusion ordered.   Review of Systems: As per HPI otherwise 10 point review of systems negative.   Past Medical History:  Diagnosis Date   Atrial flutter ([place]    a. recurrent AFlutter with RVR;  b. Amiodarone Rx started 4/16   CAD (coronary artery disease)    a. LHC 1/16:  mLAD diffuse disease, pLCx mild disease, dLCx with disease but too small for PCI, RCA ok, EF 25-30%   Chronic pain    Chronic systolic CHF (congestive heart failure) (HCC)    COPD (chronic obstructive pulmonary disease) ([facility]    Diabetes mellitus without complication ([facility]    Hypercholesteremia    Hypertension    NICM (nonischemic cardiomyopathy) ([facility]    a.dx 2016. b. 2D echo 06/2016 - Last echo 07/01/16: mod dilated LV, mod LVH, EF 25-30%, mild-mod MR, sev LAE, mild-mod reduced RV systolic function, mild-mod TR, PASP 563mG.   PAF (paroxysmal atrial  fibrillation) (HCC)    On amio - ot a candidate for flecainide due to cardiomyopathy, not a candidate for Tikosyn due to prolonged QT, and felt to be a poor candidate for ablation given left atrial size.   Pulmonary hypertension ([facility]   Tobacco abuse     Past Surgical History:  Procedure Laterality Date   CARDIOVERSION N/A 07/30/2018   Procedure: CARDIOVERSION;  Surgeon: McLarey DresserMD;  [facility];  Service: Cardiovascular;  Laterality: N/A;   LEFT AND RIGHT HEART CATHETERIZATION WITH CORONARY ANGIOGRAM N/A 11/06/2014   Procedure: LEFT AND RIGHT HEART CATHETERIZATION WITH CORONARY ANGIOGRAM;  Surgeon: JaJettie BoozeMD;  [facility];  Service: Cardiovascular;  Laterality: N/A;   RIGHT/LEFT HEART CATH AND CORONARY ANGIOGRAPHY N/A 12/06/2018   Procedure: RIGHT/LEFT HEART CATH AND CORONARY ANGIOGRAPHY;  Surgeon: McLarey DresserMD;  [facility];  Service: Cardiovascular;  Laterality: N/A;   SPLENECTOMY       reports that he quit smoking about 3 months ago. His smoking use included cigarettes. He has a 34.00 pack-year smoking history. He has never used smokeless tobacco. He reports that he does not drink alcohol or use drugs.  No Known Allergies  Family History  Problem Relation Age of Onset   Heart disease Mother    Hypertension Mother    Heart failure Mother    Heart disease Father    Hypertension Sister    Heart attack Neg Hx  Stroke Neg Hx      Prior to Admission medications   Medication Sig Start Date End Date Taking? Authorizing Provider  acetaminophen (TYLENOL) 160 MG/5ML solution Take 20.3 mLs (650 mg total) by mouth every 6 (six) hours as needed for mild pain, headache or fever. 01/24/19   Angiulli, Lavon Paganini, PA-C  bisoprolol (ZEBETA) 5 MG tablet Take 0.5 tablets (2.5 mg total) by mouth daily for 30 days. 01/24/19 02/23/19  Angiulli, Lavon Paganini, PA-C  budesonide-formoterol (SYMBICORT) 160-4.5 MCG/ACT inhaler Inhale  2 puffs into the lungs 2 (two) times daily.    [provider]  diclofenac sodium (VOLTAREN) 1 % GEL Apply 2 g topically 4 (four) times daily. 01/24/19   Angiulli, Lavon Paganini, PA-C  ferrous sulfate 325 (65 FE) MG tablet Take 325 mg by mouth 3 (three) times daily as needed (for supplementation).    [provider]  fluticasone (FLONASE) 50 MCG/ACT nasal spray Place 2 sprays into both nostrils daily as needed for allergies.     [provider]  folic acid (FOLVITE) 1 MG tablet Take 1 mg by mouth daily.    [provider]  gabapentin (NEURONTIN) 600 MG tablet Take 1 tablet (600 mg total) by mouth 2 (two) times daily. 01/24/19   Angiulli, Lavon Paganini, PA-C  guaifenesin (HUMIBID E) 400 MG TABS tablet Take 1 tablet (400 mg total) by mouth every 6 (six) hours as needed. Patient taking differently: Take 400 mg by mouth every 6 (six) hours as needed (cough).  08/02/18   Regalado, Belkys A, MD  hydrocortisone (ANUSOL-HC) 2.5 % rectal cream Place 1 application rectally 2 (two) times daily as needed for hemorrhoids or itching.     [provider]  hydrocortisone 1 % lotion Apply 1 application topically See admin instructions. Apply small amount to back twice daily for itchy rash    [provider]  insulin aspart protamine - aspart (NOVOLOG MIX 70/30 FLEXPEN) (70-30) 100 UNIT/ML FlexPen Inject 0.05 mLs (5 Units total) into the skin 2 (two) times daily. Patient taking differently: Inject 30 Units into the skin 2 (two) times daily.  08/02/18   Regalado, Belkys A, MD  ipratropium-albuterol (DUONEB) 0.5-2.5 (3) MG/3ML SOLN Take 3 mLs by nebulization 2 (two) times daily.    [provider]  isosorbide-hydrALAZINE (BIDIL) 20-37.5 MG tablet Take 1 tablet by mouth 3 (three) times daily. 01/24/19   Angiulli, Lavon Paganini, PA-C  latanoprost (XALATAN) 0.005 % ophthalmic solution Place 1 drop into both eyes at bedtime.    [provider]  Magnesium Oxide 420 (252 Mg)  MG TABS Take 420 mg by mouth 2 (two) times daily. 01/24/19   Angiulli, Lavon Paganini, PA-C  metFORMIN (GLUCOPHAGE-XR) 500 MG 24 hr tablet Take 1,000 mg by mouth 2 (two) times daily with a meal.    [provider]  metolazone (ZAROXOLYN) 2.5 MG tablet Take 1 tablet (2.5 mg total) by mouth every Wednesday. Take extra Potassium 79mq (40 total, daily) when taking this medication 02/02/19 05/03/19  MLarey Dresser MD  Multiple Vitamin (MULTIVITAMIN WITH MINERALS) TABS tablet Take 1 tablet by mouth daily. 01/25/19   Angiulli, DLavon Paganini PA-C  OXYGEN Inhale 3 L into the lungs continuous.     [provider]  pantoprazole (PROTONIX) 40 MG tablet Take 1 tablet (40 mg total) by mouth 2 (two) times daily. 01/24/19   Angiulli, DLavon Paganini PA-C  polyethylene glycol (MIRALAX / GLYCOLAX) packet Take 17 g by mouth daily. 01/24/19   AGoodwell  Lavon Paganini, PA-C  potassium chloride SA (K-DUR,KLOR-CON) 20 MEQ tablet Take 2 tablets (40 mEq total) by mouth every morning AND 1 tablet (20 mEq total) at bedtime. Patient taking differently: Take 2 tablets (40 mEq total) by mouth every morning  AND 1 tablet (20 mEq total) at bedtime. 11/26/18   Amin, Jeanella Flattery, MD  rivaroxaban (XARELTO) 20 MG TABS tablet Take 1 tablet (20 mg total) by mouth daily with supper. Patient taking differently: Take 20 mg by mouth daily at 6 PM.  01/20/17   Jettie Booze, MD  rosuvastatin (CRESTOR) 20 MG tablet Take 1 tablet (20 mg total) by mouth daily. 01/24/19   Angiulli, Lavon Paganini, PA-C  torsemide (DEMADEX) 20 MG tablet Take 4 tablets (80 mg total) by mouth 2 (two) times daily for 30 days. 01/24/19 02/23/19  Cathlyn Parsons, PA-C    Physical Exam: Vitals:   03/06/19 1930 03/06/19 1945 03/06/19 2000 03/06/19 2015  BP: (!) 114/55 119/62 (!) 114/59 (!) 130/59  Pulse: (!) 105 (!) 109 (!) 124   Resp: (!) 28 (!) 29 (!) 30 (!) 26  Temp:      TempSrc:      SpO2: 93% (!) 85% 91%   Weight:      Height:        Constitutional: NAD, calm,  comfortable Eyes: PERRL, lids and conjunctivae normal ENMT: Mucous membranes are moist. Posterior pharynx clear of any exudate or lesions.Normal dentition.  Neck: normal, supple, no masses, no thyromegaly Respiratory: Diminished breath sounds Cardiovascular: Tachycardic, irregular, 4+ pitting edema up to abdomen Abdomen: no tenderness, no masses palpated. No hepatosplenomegaly. Bowel sounds positive.  Musculoskeletal: no clubbing / cyanosis. No joint deformity upper and lower extremities. Good ROM, no contractures. Normal muscle tone.  Skin: no rashes, lesions, ulcers. No induration Neurologic: CN 2-12 grossly intact. Sensation intact, DTR normal. Strength 5/5 in all 4.  Psychiatric: Normal judgment and insight. Alert and oriented x 3. Normal mood.    Labs on Admission: I have personally reviewed following labs and imaging studies  CBC: Recent Labs  Lab 03/06/19 1829 03/06/19 1839 03/06/19 2020  WBC 10.3  --   --   NEUTROABS 8.1*  --   --   HGB 6.7* 8.5* 6.7*  HCT 24.7* 25.0* 24.2*  MCV 86.7  --   --   PLT 625*  --   --    Basic Metabolic Panel: Recent Labs  Lab 03/06/19 1829 03/06/19 1839  NA 136 138  K 4.7 4.7  CL 95*  --   CO2 29  --   GLUCOSE 97  --   BUN 18  --   CREATININE 1.12  --   CALCIUM 8.6*  --    GFR: Estimated Creatinine Clearance: 82.1 mL/min (by C-G formula based on SCr of 1.12 mg/dL). Liver Function Tests: Recent Labs  Lab 03/06/19 1829  AST 16  ALT 12  ALKPHOS 120  BILITOT 0.4  PROT 7.4  ALBUMIN 3.2*   No results for input(s): LIPASE, AMYLASE in the last 168 hours. Recent Labs  Lab 03/06/19 1854  AMMONIA 24   Coagulation Profile: No results for input(s): INR, PROTIME in the last 168 hours. Cardiac Enzymes: Recent Labs  Lab 03/06/19 1829  TROPONINI 0.05*   BNP (last 3 results) No results for input(s): PROBNP in the last 8760 hours. HbA1C: No results for input(s): HGBA1C in the last 72 hours. CBG: No results for input(s):  GLUCAP in the last 168 hours. Lipid Profile: No  results for input(s): CHOL, HDL, LDLCALC, TRIG, CHOLHDL, LDLDIRECT in the last 72 hours. Thyroid Function Tests: No results for input(s): TSH, T4TOTAL, FREET4, T3FREE, THYROIDAB in the last 72 hours. Anemia Panel: No results for input(s): VITAMINB12, FOLATE, FERRITIN, TIBC, IRON, RETICCTPCT in the last 72 hours. Urine analysis:    Component Value Date/Time   COLORURINE COLORLESS (A) 03/06/2019 1829   APPEARANCEUR CLEAR 03/06/2019 1829   LABSPEC 1.006 03/06/2019 1829   PHURINE 5.0 03/06/2019 1829   GLUCOSEU NEGATIVE 03/06/2019 1829   HGBUR NEGATIVE 03/06/2019 1829   BILIRUBINUR NEGATIVE 03/06/2019 1829   KETONESUR NEGATIVE 03/06/2019 1829   PROTEINUR NEGATIVE 03/06/2019 1829   UROBILINOGEN 0.2 10/26/2014 2206   NITRITE NEGATIVE 03/06/2019 1829   LEUKOCYTESUR NEGATIVE 03/06/2019 1829    Radiological Exams on Admission: Dg Chest 2 View  Result Date: 03/06/2019 CLINICAL DATA:  Shortness of breath EXAM: CHEST - 2 VIEW COMPARISON:  01/11/2011 FINDINGS: Bilateral interstitial and alveolar airspace opacities. Small left pleural effusion. No pneumothorax. Stable cardiomegaly. No acute osseous abnormality. IMPRESSION: Bilateral interstitial and alveolar airspace opacities with stable cardiomegaly. Differential considerations include pulmonary edema versus multilobar pneumonia. Electronically Signed   By: Kathreen Devoid   On: 03/06/2019 19:07    EKG: Independently reviewed.  Assessment/Plan Principal Problem:   Acute on chronic respiratory failure with hypoxia and hypercapnia (HCC) Active Problems:   Essential hypertension   Type 2 diabetes mellitus with neuropathy   PAF (paroxysmal atrial fibrillation) (HCC)   COPD (chronic obstructive pulmonary disease) (HCC)   Medically noncompliant   Atrial fibrillation with RVR (HCC)   Acute on chronic anemia   Acute on chronic systolic (congestive) heart failure (HCC)   Acute on chronic systolic CHF  (congestive heart failure) ([place])    1. Acute on chronic systolic CHF causing respiratory failure - 1. CHF pathway 2. Lasix 54m IV BID (Holding the PO torsemide for the moment) 3. Got 68mIV x1 in ED 4. Will give 2.33m57maroxolyn now as well (about to transfuse blood too), he takes Zaroxolyn on Weds routinely according to med rec. 5. Strict intake and output 6. Daily BMP 7. Potassium 54m71mO BID (will need more im sure). 8. CHF subspeciality evaluation in AM, message sent to cards. 2. Acute on chronic anemia - 1. No gross blood on rectal exam per EDP 2. Hemoccult card ordered 3. Holding Xarelto for the moment 4. But think this may be a slow GIB, note that he had HGB drifting down during the March admit as well. 5. Ordering anemia pnl 6. 1u PRBC transfusion ordered 1. Diuresis pre-transfusion meds as above ordered 7. Repeat CBC in AM 3. COPD - 1. Resume home nebs (which it sounds like he wasn't taking anyhow per wife) 2. 3L O2 dependent at baseline 4. PAF - 1. Continue home bisoprolol 2. Possibly in RVR due to albuterol given in ED, or anemia 3. Transfuse for anemia 4. If still tachycardic, then would consider short acting metoprolol overnight as next step. 5. HTN - continue home BP meds  DVT prophylaxis: SCDs Code Status: Full Family Communication: No family in room Disposition Plan: Home after admit Consults called: Message put in to P. TrenAbagail Kitchens CHF consult Admission status: Admit to inpatient  Severity of Illness: The appropriate patient status for this patient is INPATIENT. Inpatient status is judged to be reasonable and necessary in order to provide the required intensity of service to ensure the patient's safety. The patient's presenting symptoms, physical exam findings, and initial radiographic and  laboratory data in the context of their chronic comorbidities is felt to place them at high risk for further clinical deterioration. Furthermore, it is not anticipated that  the patient will be medically stable for discharge from the hospital within 2 midnights of admission. The following factors support the patient status of inpatient.   " The patient's presenting symptoms include SOB. " The worrisome physical exam findings include Hypoxia, anasarca. " The initial radiographic and laboratory data are worrisome because of HGB 6.7, BNP 1000, pulm edema on CXR. " The chronic co-morbidities include OSA, HTN, DM2, systolic CHF.   * I certify that at the point of admission it is my clinical judgment that the patient will require inpatient hospital care spanning beyond 2 midnights from the point of admission due to high intensity of service, high risk for further deterioration and high frequency of surveillance required.*    Darrelyn Morro M. DO [facility]  How to contact the [facility] Attending or Consulting provider [place] or covering provider during after hours [place], for this patient?  1. Check the care team in [facility] and look for a) attending/consulting [facility] provider listed and b) the [facility] team listed 2. Log into [email or web address]  Amion Physician Scheduling and messaging for groups and whole hospitals  On call and physician scheduling software for group practices, residents, hospitalists and other medical providers for call, clinic, rotation and shift schedules. On[employer] is a hospital-wide system for scheduling doctors and paging doctors on call. EasyPlot is for scientific plotting and data analysis.  [email or web address]  and use [place]'s universal password to access. If you do not have the password, please contact the hospital operator.  3. Locate the [facility] provider you are looking for under [facility] and page to a number that you can be directly reached. 4. If you still have difficulty reaching the provider, please page the [facility] (Director on Call) for the Hospitalists listed on amion for assistance.  03/06/2019, 9:47 PM
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
## 2019-03-06 NOTE — ED Triage Notes (Signed)
PT from home with Increased SHOB . Pt O2 dependent on 2 liters of nasal O2.  Pt also reports COPD,CHF,A fib

## 2019-03-07 ENCOUNTER — Other Ambulatory Visit: Payer: Self-pay

## 2019-03-07 DIAGNOSIS — J9621 Acute and chronic respiratory failure with hypoxia: Secondary | ICD-10-CM

## 2019-03-07 DIAGNOSIS — D509 Iron deficiency anemia, unspecified: Secondary | ICD-10-CM

## 2019-03-07 DIAGNOSIS — J9622 Acute and chronic respiratory failure with hypercapnia: Secondary | ICD-10-CM

## 2019-03-07 DIAGNOSIS — I484 Atypical atrial flutter: Secondary | ICD-10-CM

## 2019-03-07 DIAGNOSIS — D649 Anemia, unspecified: Secondary | ICD-10-CM

## 2019-03-07 DIAGNOSIS — I5023 Acute on chronic systolic (congestive) heart failure: Secondary | ICD-10-CM

## 2019-03-07 LAB — CBC WITH DIFFERENTIAL/PLATELET
Abs Immature Granulocytes: 0 10*3/uL (ref 0.00–0.07)
Basophils Absolute: 0.1 10*3/uL (ref 0.0–0.1)
Basophils Relative: 1 %
Eosinophils Absolute: 0.3 10*3/uL (ref 0.0–0.5)
Eosinophils Relative: 3 %
HCT: 26 % — ABNORMAL LOW (ref 39.0–52.0)
Hemoglobin: 7.5 g/dL — ABNORMAL LOW (ref 13.0–17.0)
Lymphocytes Relative: 14 %
Lymphs Abs: 1.2 10*3/uL (ref 0.7–4.0)
MCH: 24 pg — ABNORMAL LOW (ref 26.0–34.0)
MCHC: 28.8 g/dL — ABNORMAL LOW (ref 30.0–36.0)
MCV: 83.3 fL (ref 80.0–100.0)
Monocytes Absolute: 1.1 10*3/uL — ABNORMAL HIGH (ref 0.1–1.0)
Monocytes Relative: 13 %
Neutro Abs: 6.1 10*3/uL (ref 1.7–7.7)
Neutrophils Relative %: 69 %
Platelets: 603 10*3/uL — ABNORMAL HIGH (ref 150–400)
RBC: 3.12 MIL/uL — ABNORMAL LOW (ref 4.22–5.81)
RDW: 21.8 % — ABNORMAL HIGH (ref 11.5–15.5)
WBC: 8.8 10*3/uL (ref 4.0–10.5)
nRBC: 13 /100 WBC — ABNORMAL HIGH
nRBC: 6.2 % — ABNORMAL HIGH (ref 0.0–0.2)

## 2019-03-07 LAB — BLOOD CULTURE ID PANEL (REFLEXED)
Acinetobacter baumannii: NOT DETECTED
Candida albicans: NOT DETECTED
Candida glabrata: NOT DETECTED
Candida krusei: NOT DETECTED
Candida parapsilosis: NOT DETECTED
Candida tropicalis: NOT DETECTED
Enterobacter cloacae complex: NOT DETECTED
Enterobacteriaceae species: NOT DETECTED
Enterococcus species: NOT DETECTED
Escherichia coli: NOT DETECTED
Haemophilus influenzae: NOT DETECTED
Klebsiella oxytoca: NOT DETECTED
Klebsiella pneumoniae: NOT DETECTED
Listeria monocytogenes: NOT DETECTED
Methicillin resistance: DETECTED — AB
Neisseria meningitidis: NOT DETECTED
Proteus species: NOT DETECTED
Pseudomonas aeruginosa: NOT DETECTED
Serratia marcescens: NOT DETECTED
Staphylococcus aureus (BCID): NOT DETECTED
Staphylococcus species: DETECTED — AB
Streptococcus agalactiae: NOT DETECTED
Streptococcus pneumoniae: NOT DETECTED
Streptococcus pyogenes: NOT DETECTED
Streptococcus species: NOT DETECTED

## 2019-03-07 LAB — LACTIC ACID, PLASMA: Lactic Acid, Venous: 1 mmol/L (ref 0.5–1.9)

## 2019-03-07 LAB — CBC
HCT: 24.3 % — ABNORMAL LOW (ref 39.0–52.0)
Hemoglobin: 7 g/dL — ABNORMAL LOW (ref 13.0–17.0)
MCH: 24 pg — ABNORMAL LOW (ref 26.0–34.0)
MCHC: 28.8 g/dL — ABNORMAL LOW (ref 30.0–36.0)
MCV: 83.2 fL (ref 80.0–100.0)
Platelets: 582 10*3/uL — ABNORMAL HIGH (ref 150–400)
RBC: 2.92 MIL/uL — ABNORMAL LOW (ref 4.22–5.81)
RDW: 21.6 % — ABNORMAL HIGH (ref 11.5–15.5)
WBC: 10.4 10*3/uL (ref 4.0–10.5)
nRBC: 3.4 % — ABNORMAL HIGH (ref 0.0–0.2)

## 2019-03-07 LAB — BASIC METABOLIC PANEL
Anion gap: 7 (ref 5–15)
BUN: 19 mg/dL (ref 8–23)
CO2: 35 mmol/L — ABNORMAL HIGH (ref 22–32)
Calcium: 8.3 mg/dL — ABNORMAL LOW (ref 8.9–10.3)
Chloride: 99 mmol/L (ref 98–111)
Creatinine, Ser: 1.19 mg/dL (ref 0.61–1.24)
GFR calc Af Amer: >60 mL/min (ref >60)
GFR calc non Af Amer: >60 mL/min (ref >60)
Glucose, Bld: 113 mg/dL — ABNORMAL HIGH (ref 70–99)
Potassium: 4.5 mmol/L (ref 3.5–5.1)
Sodium: 141 mmol/L (ref 135–145)

## 2019-03-07 LAB — URINE CULTURE: Culture: <10000 — AB

## 2019-03-07 LAB — GLUCOSE, CAPILLARY
Glucose-Capillary: 103 mg/dL — ABNORMAL HIGH (ref 70–99)
Glucose-Capillary: 128 mg/dL — ABNORMAL HIGH (ref 70–99)
Glucose-Capillary: 121 mg/dL — ABNORMAL HIGH (ref 70–99)
Glucose-Capillary: 143 mg/dL — ABNORMAL HIGH (ref 70–99)
Glucose-Capillary: 162 mg/dL — ABNORMAL HIGH (ref 70–99)

## 2019-03-07 LAB — PROCALCITONIN: Procalcitonin: 0.12 ng/mL

## 2019-03-07 LAB — PATHOLOGIST SMEAR REVIEW

## 2019-03-07 LAB — UREA NITROGEN, URINE: Urea Nitrogen, Ur: 143 mg/dL

## 2019-03-07 MED ORDER — DOCUSATE SODIUM 100 MG PO CAPS
100.0000 mg | ORAL_CAPSULE | Freq: Every day | ORAL | Status: DC
  Administered 2019-03-07 – 2019-03-23 (×11): 100 mg via ORAL
  Filled 2019-03-07 (×15): qty 1

## 2019-03-07 MED ORDER — ORAL CARE MOUTH RINSE
15.0000 mL | Freq: Two times a day (BID) | OROMUCOSAL | Status: DC
  Administered 2019-03-07 – 2019-03-25 (×25): 15 mL via OROMUCOSAL

## 2019-03-07 MED ORDER — FERROUS SULFATE 325 (65 FE) MG PO TABS
325.0000 mg | ORAL_TABLET | Freq: Two times a day (BID) | ORAL | Status: DC
  Administered 2019-03-07 – 2019-03-14 (×13): 325 mg via ORAL
  Filled 2019-03-07 (×14): qty 1

## 2019-03-07 MED ORDER — SODIUM CHLORIDE 0.9 % IV SOLN
510.0000 mg | Freq: Once | INTRAVENOUS | Status: AC
  Administered 2019-03-07: 510 mg via INTRAVENOUS
  Filled 2019-03-07: qty 510

## 2019-03-07 MED ORDER — INSULIN ASPART 100 UNIT/ML ~~LOC~~ SOLN
0.0000 [IU] | Freq: Three times a day (TID) | SUBCUTANEOUS | Status: DC
  Administered 2019-03-07 – 2019-03-08 (×3): 2 [IU] via SUBCUTANEOUS
  Administered 2019-03-08: 5 [IU] via SUBCUTANEOUS
  Administered 2019-03-08: 2 [IU] via SUBCUTANEOUS
  Administered 2019-03-09: 3 [IU] via SUBCUTANEOUS
  Administered 2019-03-09: 5 [IU] via SUBCUTANEOUS
  Administered 2019-03-10: 11 [IU] via SUBCUTANEOUS
  Administered 2019-03-10: 5 [IU] via SUBCUTANEOUS
  Administered 2019-03-10: 07:00:00 3 [IU] via SUBCUTANEOUS
  Administered 2019-03-11 (×2): 5 [IU] via SUBCUTANEOUS

## 2019-03-07 NOTE — Consult Note (Addendum)
Referring Provider: [facility]   Primary Care Physician:  Cl[facility], [facility] Primary Gastroenterologist:  unassigned     Reason for Consultation:   anemia    ASSESSMENT / PLAN:    23. 73 -year-old male with multiple medical problems and multiple recent admissions for acute on chronic systolic heart failure/acute on chronic respiratory failure.  He is readmitted now with the same. Undergoing diuresis.   2. IDA on Xarelto. He has chronic anemia with recent blood transfusion requirements. Now recurrent decline in hgb from baseline of 8-9 down to 6.7.  Denies overt GI blood loss.  No NSAID use.  Denies N/V, bowel changes or other localizing GI symptoms however a CTAP w/ contrast was done yesterday for abdominal pain. No acute findings but multipel prominent / enlarged lymph nodes in the retroperitoneum and upper abdomen felt to be reactive but nonspecific -He received a dose of IV iron today -Sub-optimal response to 1 unit of blood today -He needs endoscopic work-up when stable from cardiopulmonary standpoint.  3. Hx of splenectomy.     HPI:    Nicholas Caldwell is a 73 y.o. male with multiple medical problems not limited to CAD, Aflutter on Xarelto, chronic systolic heart failure, COPD and diabetes.  He has had multiple recent admissions for acute on chronic heart and respiratory failure . During February admission he required a unit of blood. Hgb drifted again during March hospitalization and transfused another units of blood.   Neither time was there evidence for overt GI bleeding.  Presented to ED yesterday with shortness of breath and edema.  He was hypoxic and tachycardic, received a dose of antibiotics empirically.  Now with acute on chronic systolic heart failure /acute on chronic respiratory failure.  His hemoglobin was 6.7 on admission yesterday, down from 8.3 late March.  Patient denies overt GI blood loss.  He is on Xarelto but no NSAIDs.  No localizing GI symptoms such as  abdominal pain, bowel changes, nausea or vomiting.  Labs show that he is iron deficient.  After unit of blood early this a.m. his hemoglobin is up to 7. Patient gives a history of a colonoscopy approximately 10 years ago at the [facility]. Denies [facility] of colon cancer.    Past Medical History:  Diagnosis Date   Atrial flutter ([place])    a. recurrent AFlutter with RVR;  b. Amiodarone Rx started 4/16   CAD (coronary artery disease)    a. LHC 1/16:  mLAD diffuse disease, pLCx mild disease, dLCx with disease but too small for PCI, RCA ok, EF 25-30%   Chronic pain    Chronic systolic CHF (congestive heart failure) (HCC)    COPD (chronic obstructive pulmonary disease) ([place])    Diabetes mellitus without complication ([place])    Hypercholesteremia    Hypertension    NICM (nonischemic cardiomyopathy) ([place])    a.dx 2016. b. 2D echo 06/2016 - Last echo 07/01/16: mod dilated LV, mod LVH, EF 25-30%, mild-mod MR, sev LAE, mild-mod reduced RV systolic function, mild-mod TR, PASP 45mHG.   PAF (paroxysmal atrial fibrillation) (HCC)    On amio - ot a candidate for flecainide due to cardiomyopathy, not a candidate for Tikosyn due to prolonged QT, and felt to be a poor candidate for ablation given left atrial size.   Pulmonary hypertension ([place]    Tobacco abuse     Past Surgical History:  Procedure Laterality Date   CARDIOVERSION N/A 07/30/2018   Procedure: CARDIOVERSION;  Surgeon: MLarey Dresser MD;  Location: [facility] ENDOSCOPY;  Service: Cardiovascular;  Laterality: N/A;   LEFT AND RIGHT HEART CATHETERIZATION WITH CORONARY ANGIOGRAM N/A 11/06/2014   Procedure: LEFT AND RIGHT HEART CATHETERIZATION WITH CORONARY ANGIOGRAM;  Surgeon: Jettie Booze, MD;  Location[facility] CATH LAB;  Service: Cardiovascular;  Laterality: N/A;   RIGHT/LEFT HEART CATH AND CORONARY ANGIOGRAPHY N/A 12/06/2018   Procedure: RIGHT/LEFT HEART CATH AND CORONARY ANGIOGRAPHY;  Surgeon: Larey Dresser, MD;  Location: [facility] CV  LAB;  Service: Cardiovascular;  Laterality: N/A;   SPLENECTOMY      Prior to Admission medications   Medication Sig Start Date End Date Taking? Authorizing Provider  diazepam (VALIUM) 5 MG tablet Take 5 mg by mouth daily as needed for anxiety.   Yes [provider]  gabapentin (NEURONTIN) 600 MG tablet Take 1 tablet (600 mg total) by mouth 2 (two) times daily. 01/24/19  Yes Angiulli, Lavon Paganini, PA-C  insulin aspart protamine - aspart (NOVOLOG MIX 70/30 FLEXPEN) (70-30) 100 UNIT/ML FlexPen Inject 0.05 mLs (5 Units total) into the skin 2 (two) times daily. Patient taking differently: Inject 30 Units into the skin 2 (two) times daily.  08/02/18  Yes Regalado, Belkys A, MD  ipratropium-albuterol (DUONEB) 0.5-2.5 (3) MG/3ML SOLN Take 3 mLs by nebulization 2 (two) times daily as needed (shortness of breath/wheezing).    Yes [provider]  isosorbide-hydrALAZINE (BIDIL) 20-37.5 MG tablet Take 1 tablet by mouth 3 (three) times daily. Patient taking differently: Take 1 tablet by mouth 2 (two) times a day.  01/24/19  Yes Angiulli, Lavon Paganini, PA-C  magnesium oxide (MAG-OX) 400 MG tablet Take 400 mg by mouth daily.   Yes [provider]  metFORMIN (GLUCOPHAGE-XR) 500 MG 24 hr tablet Take 1,000 mg by mouth 2 (two) times daily with a meal.   Yes [provider]  metolazone (ZAROXOLYN) 2.5 MG tablet Take 1 tablet (2.5 mg total) by mouth every Wednesday. Take extra Potassium 31mq (40 total, daily) when taking this medication 02/02/19 05/03/19 Yes MLarey Dresser MD  oxyCODONE-acetaminophen (PERCOCET) 10-325 MG tablet Take 1 tablet by mouth every 6 (six) hours.   Yes [provider]  OXYGEN Inhale 3 L into the lungs continuous.    Yes [provider]  polyethylene glycol (MIRALAX / GLYCOLAX) packet Take 17 g by mouth daily. Patient taking differently: Take 17 g by mouth daily as needed (constipation).  01/24/19  Yes Angiulli, DLavon Paganini PA-C  potassium chloride SA  (K-DUR) 20 MEQ tablet Take 40 mEq by mouth See admin instructions. Take 2 tablets (40 meq) by mouth every morning, take an additional tablet (20 meq) at night if taking metolazone that day.   Yes [provider]  rivaroxaban (XARELTO) 20 MG TABS tablet Take 1 tablet (20 mg total) by mouth daily with supper. Patient taking differently: Take 20 mg by mouth at bedtime.  01/20/17  Yes VJettie Booze MD  rosuvastatin (CRESTOR) 40 MG tablet Take 20 mg by mouth daily.   Yes [provider]  torsemide (DEMADEX) 20 MG tablet Take 4 tablets (80 mg total) by mouth 2 (two) times daily for 30 days. 01/24/19 03/10/19 Yes Angiulli, DLavon Paganini PA-C  acetaminophen (TYLENOL) 160 MG/5ML solution Take 20.3 mLs (650 mg total) by mouth every 6 (six) hours as needed for mild pain, headache or fever. Patient not taking: Reported on 03/06/2019 01/24/19   Angiulli, DLavon Paganini PA-C  albuterol (VENTOLIN HFA) 108 (90 Base) MCG/ACT inhaler Inhale 2 puffs into the lungs every  6 (six) hours as needed for wheezing or shortness of breath.    [provider]  bisoprolol (ZEBETA) 5 MG tablet Take 0.5 tablets (2.5 mg total) by mouth daily for 30 days. 01/24/19 02/23/19  Angiulli, Lavon Paganini, PA-C  budesonide-formoterol (SYMBICORT) 160-4.5 MCG/ACT inhaler Inhale 2 puffs into the lungs 2 (two) times daily.    [provider]  carvedilol (COREG) 25 MG tablet Take 12.5 mg by mouth 2 (two) times daily with a meal.    [provider]  diclofenac sodium (VOLTAREN) 1 % GEL Apply 2 g topically 4 (four) times daily. 01/24/19   Angiulli, Lavon Paganini, PA-C  ferrous sulfate 325 (65 FE) MG tablet Take 325 mg by mouth See admin instructions. Take one tablet by mouth twice daily on Monday, Wednesday, Friday    [provider]  fluticasone (FLONASE) 50 MCG/ACT nasal spray Place 2 sprays into both nostrils daily as needed for allergies.     [provider]  folic acid (FOLVITE) 1 MG tablet Take 1 mg by  mouth daily.    [provider]  guaifenesin (HUMIBID E) 400 MG TABS tablet Take 1 tablet (400 mg total) by mouth every 6 (six) hours as needed. Patient taking differently: Take 400 mg by mouth every 6 (six) hours as needed (cough).  08/02/18   Regalado, Belkys A, MD  hydrocortisone (ANUSOL-HC) 2.5 % rectal cream Place 1 application rectally 2 (two) times daily as needed for hemorrhoids or itching.     [provider]  hydrocortisone 1 % lotion Apply 1 application topically See admin instructions. Apply small amount to back twice daily for itchy rash    [provider]  latanoprost (XALATAN) 0.005 % ophthalmic solution Place 1 drop into both eyes at bedtime.    [provider]  Magnesium Oxide 420 (252 Mg) MG TABS Take 420 mg by mouth 2 (two) times daily. Patient not taking: Reported on 03/06/2019 01/24/19   Angiulli, Lavon Paganini, PA-C  Multiple Vitamin (MULTIVITAMIN WITH MINERALS) TABS tablet Take 1 tablet by mouth daily. Patient not taking: Reported on 03/06/2019 01/25/19   Angiulli, Lavon Paganini, PA-C  pantoprazole (PROTONIX) 40 MG tablet Take 1 tablet (40 mg total) by mouth 2 (two) times daily. 01/24/19   Angiulli, Lavon Paganini, PA-C  potassium chloride SA (K-DUR,KLOR-CON) 20 MEQ tablet Take 2 tablets (40 mEq total) by mouth every morning AND 1 tablet (20 mEq total) at bedtime. Patient not taking: Reported on 03/06/2019 11/26/18   Damita Lack, MD  rosuvastatin (CRESTOR) 20 MG tablet Take 1 tablet (20 mg total) by mouth daily. Patient not taking: Reported on 03/06/2019 01/24/19   Angiulli, Lavon Paganini, PA-C  sodium chloride (OCEAN) 0.65 % SOLN nasal spray Place 2 sprays into both nostrils 4 (four) times daily.    [provider]    Current Facility-Administered Medications  Medication Dose Route Frequency Provider Last Rate Last Dose   0.9 %  sodium chloride infusion (Manually program via Guardrails IV Fluids)   Intravenous Once Andee Poles, MD       0.9 %   sodium chloride infusion  250 mL Intravenous PRN Etta Quill, DO       acetaminophen (TYLENOL) solution 650 mg  650 mg Oral Q6H PRN Etta Quill, DO       bisoprolol (ZEBETA) tablet 2.5 mg  2.5 mg Oral Daily Jennette Kettle M, DO   2.5 mg at 03/07/19 0758   docusate sodium (COLACE) capsule 100 mg  100 mg Oral Daily Shahmehdi, Seyed A, MD       ferrous sulfate tablet 325 mg  325 mg Oral BID WC Shahmehdi, Seyed A, MD       fluticasone (FLONASE) 50 MCG/ACT nasal spray 2 spray  2 spray Each Nare Daily PRN Etta Quill, DO       folic acid (FOLVITE) tablet 1 mg  1 mg Oral Daily Jennette Kettle M, DO   1 mg at 03/07/19 0756   furosemide (LASIX) injection 80 mg  80 mg Intravenous BID Etta Quill, DO   80 mg at 03/07/19 3846   gabapentin (NEURONTIN) tablet 600 mg  600 mg Oral BID Jennette Kettle M, DO   600 mg at 03/07/19 6599   guaiFENesin tablet 400 mg  400 mg Oral Q6H PRN Etta Quill, DO       insulin aspart (novoLOG) injection 0-15 Units  0-15 Units Subcutaneous TID WC Shahmehdi, Seyed A, MD   2 Units at 03/07/19 1300   insulin NPH Human (NOVOLIN N) injection 10 Units  10 Units Subcutaneous BID AC & HS Etta Quill, DO   10 Units at 03/07/19 1131   ipratropium-albuterol (DUONEB) 0.5-2.5 (3) MG/3ML nebulizer solution 3 mL  3 mL Nebulization BID Etta Quill, DO   3 mL at 03/07/19 3570   isosorbide-hydrALAZINE (BIDIL) 20-37.5 MG per tablet 1 tablet  1 tablet Oral TID Etta Quill, DO   1 tablet at 03/07/19 0757   latanoprost (XALATAN) 0.005 % ophthalmic solution 1 drop  1 drop Both Eyes QHS Etta Quill, DO       MEDLINE mouth rinse  15 mL Mouth Rinse BID Jennette Kettle M, DO   15 mL at 03/07/19 1301   mometasone-formoterol (DULERA) 200-5 MCG/ACT inhaler 2 puff  2 puff Inhalation BID Etta Quill, DO   2 puff at 03/07/19 0807   ondansetron (ZOFRAN) injection 4 mg  4 mg Intravenous Q6H PRN Etta Quill, DO       oxyCODONE-acetaminophen  (PERCOCET/ROXICET) 5-325 MG per tablet 2 tablet  2 tablet Oral Q6H PRN Vertis Kelch, NP   2 tablet at 03/07/19 0750   pantoprazole (PROTONIX) EC tablet 40 mg  40 mg Oral BID Etta Quill, DO   40 mg at 03/07/19 0827   polyethylene glycol (MIRALAX / GLYCOLAX) packet 17 g  17 g Oral Daily Jennette Kettle M, DO   17 g at 03/07/19 0759   potassium chloride SA (K-DUR) CR tablet 20 mEq  20 mEq Oral BID Etta Quill, DO   20 mEq at 03/07/19 0756   rosuvastatin (CRESTOR) tablet 20 mg  20 mg Oral q1800 Etta Quill, DO   20 mg at 03/07/19 0008   sodium chloride flush (NS) 0.9 % injection 3 mL  3 mL Intravenous Q12H Jennette Kettle M, DO   3 mL at 03/07/19 0830   sodium chloride flush (NS) 0.9 % injection 3 mL  3 mL Intravenous PRN Etta Quill, DO   3 mL at 03/07/19 1779    Allergies as of 03/06/2019   (No Known Allergies)    Family History  Problem Relation Age of Onset   Heart disease Mother    Hypertension Mother    Heart failure Mother    Heart disease Father    Hypertension Sister    Heart attack Neg Hx    Stroke Neg Hx     Social History   Socioeconomic  History   Marital status: Married    Spouse name: Not on file   Number of children: Not on file   Years of education: Not on file   Highest education level: Not on file  Occupational History   Not on file  Social Needs   Financial resource strain: Not on file   Food insecurity:    Worry: Not on file    Inability: Not on file   Transportation needs:    Medical: Not on file    Non-medical: Not on file  Tobacco Use   Smoking status: Former Smoker    Packs/day: 1.00    Years: 34.00    Pack years: 34.00    Types: Cigarettes    Last attempt to quit: 11/30/2018    Years since quitting: 0.2   Smokeless tobacco: Never Used  Substance and Sexual Activity   Alcohol use: No    Alcohol/week: 0.0 standard drinks   Drug use: No   Sexual activity: Not on file  Lifestyle    Physical activity:    Days per week: Not on file    Minutes per session: Not on file   Stress: Not on file  Relationships   Social connections:    Talks on phone: Not on file    Gets together: Not on file    Attends religious service: Not on file    Active member of club or organization: Not on file    Attends meetings of clubs or organizations: Not on file    Relationship status: Not on file   Intimate partner violence:    Fear of current or ex partner: Not on file    Emotionally abused: Not on file    Physically abused: Not on file    Forced sexual activity: Not on file  Other Topics Concern   Not on file  Social History Narrative   Not on file    Review of Systems: All systems reviewed and negative except where noted in HPI.  Physical Exam: Vital signs in last 24 hours: Temp:  [97 F (36.1 C)-99.3 F (37.4 C)] 97.9 F (36.6 C) (05/04 1143) Pulse Rate:  [79-124] 84 (05/04 1143) Resp:  [18-30] 22 (05/04 1143) BP: (100-130)/(52-74) 107/62 (05/04 1143) SpO2:  [85 %-100 %] 95 % (05/04 1143) FiO2 (%):  [32 %] 32 % (05/04 0800) Weight:  [120.2 kg-125 kg] 123.4 kg (05/04 0121) Last BM Date: 03/05/19 General:   Alert, black male in NAD in bedside chair Psych:  Pleasant, cooperative. Normal mood and affect. Eyes:  Pupils equal, sclera clear, no icterus.   Conjunctiva pink. Ears:  Normal auditory acuity. Nose:  No deformity, discharge,  or lesions. Neck:  Supple; no masses Lungs:  Bilateral wheezing. Breathing slightly labored on 02.   Heart:  Regular rate and rhythm; + murmur, mild-moderate pitting edema of both lower extremities Abdomen:  Soft, protuberant,  Non-tender, BS active, no palp mass    Rectal:  Deferred  Msk:  Symmetrical without gross deformities. . Neurologic:  Alert and  oriented x4;  grossly normal neurologically. Skin:  Intact without significant lesions or rashes.   Intake/Output from previous day: 05/03 0701 - 05/04 0700 In: 1192.7 [P.O.:240;  Blood:315; IV Piggyback:637.7] Out: 1200 [Urine:1200] Intake/Output this shift: Total I/O In: 500 [P.O.:500] Out: 700 [Urine:700]  Lab Results: Recent Labs    03/06/19 1829 03/06/19 1839 03/06/19 2020 03/07/19 0524  WBC 10.3  --   --  10.4  HGB 6.7* 8.5* 6.7* 7.0*  HCT 24.7* 25.0* 24.2* 24.3*  PLT 625*  --   --  582*   BMET Recent Labs    03/06/19 1829 03/06/19 1839 03/07/19 0524  NA 136 138 141  K 4.7 4.7 4.5  CL 95*  --  99  CO2 29  --  35*  GLUCOSE 97  --  113*  BUN 18  --  19  CREATININE 1.12  --  1.19  CALCIUM 8.6*  --  8.3*   LFT Recent Labs    03/06/19 1829  PROT 7.4  ALBUMIN 3.2*  AST 16  ALT 12  ALKPHOS 120  BILITOT 0.4   PT/INR No results for input(s): LABPROT, INR in the last 72 hours. Hepatitis Panel No results for input(s): HEPBSAG, HCVAB, HEPAIGM, HEPBIGM in the last 72 hours.    Studies/Results: Dg Chest 2 View  Result Date: 03/06/2019 CLINICAL DATA:  Shortness of breath EXAM: CHEST - 2 VIEW COMPARISON:  01/11/2011 FINDINGS: Bilateral interstitial and alveolar airspace opacities. Small left pleural effusion. No pneumothorax. Stable cardiomegaly. No acute osseous abnormality. IMPRESSION: Bilateral interstitial and alveolar airspace opacities with stable cardiomegaly. Differential considerations include pulmonary edema versus multilobar pneumonia. Electronically Signed   By: Kathreen Devoid   On: 03/06/2019 19:07     Tye Savoy, NP-C @  03/07/2019, 1:44 PM

## 2019-03-07 NOTE — TOC Initial Note (Deleted)
Transition of Care [facility]) - Initial/Assessment Note    Patient Details  Name: Nicholas Caldwell MRN: [id number] Date of Birth: 1946/08/26  Transition [facility]) CM/SW Contact:    Sherrilyn Rist Phone Number: [phone] 03/07/2019, 8:48 AM  Clinical Narrative:                 03/07/2019- Patient known to me from previous admission; patient was active with [facility] for [facility] services but with the Covid 19 virus, they would no longer go to his home; he is requesting a new [place] agency, choice offered, pt chose [facility]; Dan with [facility] called for arrangements. CM following for progression of care. B Chandlerr RN,MHA,BSN  01/13/2019 - Clinical Narrative:                 CM talked to patient at the bedside with his spouse present. Introduced my role as CM; PCP is Dr [name] at the [facility]; he gets his medication there also; back up pharmacy is [facility] on [address]; DME - home oxygen, walker and cane at home; he does not drive, his wife takes him to his apts/ errands; he is active with [facility] as prior to admission. No need voiced at this time. CM will continue to follow for progression of care. Mindi Slicker RN,MHA,BSN  Expected Discharge Plan: Home/Self Care Barriers to Discharge: No Barriers Identified   Patient Goals and CMS Choice Patient states their goals for this hospitalization and ongoing recovery are:: to stay at home CMS Medicare.gov Compare Post Acute Care list provided to:: Patient    Expected Discharge Plan and Services Expected Discharge Plan: Home/Self Care   Discharge Planning Services: CM Consult   Living arrangements for the past 2 months: Single Family Home                                      Prior Living Arrangements/Services Living arrangements for the past 2 months: Single Family Home Lives with:: Spouse          Need for Family Participation in Patient Care: Yes (Comment) Care giver support system in  place?: Yes (comment)      Activities of Daily Living Home Assistive Devices/Equipment: Shower chair with back, Oxygen, Dentures (specify type), Eyeglasses ADL Screening (condition at time of admission) Patient's cognitive ability adequate to safely complete daily activities?: Yes Is the patient deaf or have difficulty hearing?: No Does the patient have difficulty seeing, even when wearing glasses/contacts?: No Does the patient have difficulty concentrating, remembering, or making decisions?: No Patient able to express need for assistance with ADLs?: Yes Does the patient have difficulty dressing or bathing?: No Independently performs ADLs?: Yes (appropriate for developmental age) Does the patient have difficulty walking or climbing stairs?: No Weakness of Legs: None Weakness of Arms/Hands: None  Permission Sought/Granted                  Emotional Assessment         Alcohol / Substance Use: Not Applicable Psych Involvement: No (comment)  Admission diagnosis:  SOB Patient Active Problem List   Diagnosis Date Noted  . Acute on chronic systolic CHF (congestive heart failure) ([place]) 03/06/2019  . Diabetes mellitus type 2 in obese ([place])   . Primary osteoarthritis of right hip   . Hypomagnesemia   . Steroid-induced hyperglycemia   . Supplemental oxygen dependent   .  Leukocytosis   . Hypokalemia   . Acute on chronic anemia   . Diabetic peripheral neuropathy ([place])   . Acute on chronic systolic (congestive) heart failure ([place])   . Acute on chronic respiratory failure ([place]) 01/14/2019  . Hypoxia   . Altered mental status   . Heart failure ([place]) 12/30/2018  . AKI (acute kidney injury) ([place])   . Acute respiratory failure ([place]) 11/22/2018  . Acute on chronic respiratory failure with hypoxia and hypercapnia ([place]) 11/13/2018  . Metabolic encephalopathy 50/93/2671  . Acute respiratory failure with hypoxia and hypercapnia ([place]) 07/26/2018  . Acute exacerbation of CHF  (congestive heart failure) ([place]) 07/26/2018  . CHF exacerbation ([place]) 05/01/2018  . CHF (congestive heart failure) ([place]) 04/30/2018  . Chronic respiratory failure with hypoxia ([place]) 12/28/2017  . Atrial fibrillation with RVR ([place])   . SVT (supraventricular tachycardia) ([place])   . COPD GOLD 0   . Medically noncompliant   . Panlobular emphysema ([place])   . OSA (obstructive sleep apnea)   . Pulmonary hypertension ([place])   . Disorientation   . Pressure injury of skin 09/07/2016  . Acute on chronic combined systolic and diastolic CHF (congestive heart failure) ([place]) 09/05/2016  . Skin lesion-left heal 09/05/2016  . Chronic systolic CHF (congestive heart failure) ([place]) 07/16/2016  . COPD (chronic obstructive pulmonary disease) ([place]) 07/16/2016  . Uncontrolled type 2 diabetes mellitus with complication ([place])   . Diabetic polyneuropathy associated with diabetes mellitus due to underlying condition ([place])   . Normocytic anemia 06/29/2016  . CAD - Non-obstructive by LHC1/16 01/21/2016  . Nonischemic cardiomyopathy ([place]) 10/09/2015  . PAF (paroxysmal atrial fibrillation) ([place])   . Chronic pain 02/19/2015  . Morbid obesity ([place]) 02/13/2015  . Dyspnea   . Elevated troponin I level 11/01/2014  . COPD exacerbation ([place]) 11/01/2014  . Essential hypertension 10/31/2014  . Type 2 diabetes mellitus with neuropathy 10/31/2014  . Hyperlipidemia  10/31/2014  . Cigarette smoker 10/31/2014   PCP:  Clinic, [place]:   [place], [address] [place] [address] Phone: [phone] Fax: [phone]  [facility] [place], [place] - [address] [place] [address] Phone: [phone] Fax: [place], [place] - [phone] [place]  [address] Phone: [phone] Fax: [phone]  [place], [place] - [address] [place] [address] Phone: [phone] Fax: [phone]     Social Determinants of Health (SDOH) Interventions    Readmission Risk Interventions No flowsheet data found.

## 2019-03-07 NOTE — Plan of Care (Signed)
  Problem: Education: Goal: Knowledge of General Education information will improve Description Including pain rating scale, medication(s)/side effects and non-pharmacologic comfort measures Outcome: Progressing   Problem: Health Behavior/Discharge Planning: Goal: Ability to manage health-related needs will improve Outcome: Progressing   Problem: Clinical Measurements: Goal: Ability to maintain clinical measurements within normal limits will improve Outcome: Progressing Goal: Will remain free from infection Outcome: Progressing Goal: Diagnostic test results will improve Outcome: Progressing Goal: Respiratory complications will improve Outcome: Progressing Goal: Cardiovascular complication will be avoided Outcome: Progressing   Problem: Activity: Goal: Risk for activity intolerance will decrease Outcome: Progressing   Problem: Nutrition: Goal: Adequate nutrition will be maintained Outcome: Progressing   Problem: Coping: Goal: Level of anxiety will decrease Outcome: Progressing   Problem: Elimination: Goal: Will not experience complications related to bowel motility Outcome: Progressing Goal: Will not experience complications related to urinary retention Outcome: Progressing   Problem: Pain Managment: Goal: General experience of comfort will improve Outcome: Progressing   Problem: Safety: Goal: Ability to remain free from injury will improve Outcome: Progressing   Problem: Skin Integrity: Goal: Risk for impaired skin integrity will decrease Outcome: Progressing   Problem: Education: Goal: Ability to demonstrate management of disease process will improve Outcome: Progressing Goal: Ability to verbalize understanding of medication therapies will improve Outcome: Progressing Goal: Individualized Educational Video(s) Outcome: Progressing   Problem: Activity: Goal: Capacity to carry out activities will improve Outcome: Progressing   Problem: Cardiac: Goal:  Ability to achieve and maintain adequate cardiopulmonary perfusion will improve Outcome: Progressing

## 2019-03-07 NOTE — TOC Initial Note (Signed)
Transition of Care [facility]) - Initial/Assessment Note    Patient Details  Name: Nicholas Caldwell MRN: [id number] Date of Birth: Jun 13, 1946  [facility]) CM/SW Contact:    Sherrilyn Rist Phone Number: [phone] 03/07/2019, 11:00 AM  Clinical Narrative:                 03/07/2019- Patient known to me from previous admission; patient was active with [facility] for [facility] services but with the Covid 19 virus, they would no longer go to his home; he is requesting a new [place] agency, choice offered, pt chose [facility]; Dan with [facility]) called for arrangements. CM following for progression of care. B Chandlerr RN,MHA,BSN  01/13/2019 - Clinical Narrative: CM talked to patient at the bedside with his spouse present. Introduced my role as CM; PCP is Dr [name] at the [facility]; he gets his medication there also; back up pharmacy is [facility] on [address]; DME - home oxygen, walker and cane at home; he does not drive, his wife takes him to his apts/ errands; he is active with [facility] as prior to admission. No need voiced at this time. CM will continue to follow for progression of care. Mindi Slicker RN,MHA,BSN  Expected Discharge Plan: Home w Home Health Services Barriers to Discharge: No Barriers Identified   Patient Goals and CMS Choice Patient states their goals for this hospitalization and ongoing recovery are:: to stay at home CMS Medicare.gov Compare Post Acute Care list provided to:: Patient Choice offered to / list presented to : Patient  Expected Discharge Plan and Services Expected Discharge Plan: [place] In-house Referral: NA Discharge Planning Services: CM Consult Post Acute Care Choice: NA Living arrangements for the past 2 months: Single Family Home                 DME Arranged: N/A DME Agency: NA       HH Arranged: RN, Disease Management, PT [place] Agency: [facility] ([place]) Date  HH Agency Contacted: 03/07/19 Time [place]: 1058 Representative spoke with at [place]: Hydrographic surveyor  Prior Living Arrangements/Services Living arrangements for the past 2 months: [place] with:: Spouse Patient language and need for interpreter reviewed:: Yes Do you feel safe going back to the place where you live?: Yes      Need for Family Participation in Patient Care: Yes (Comment) Care giver support system in place?: Yes (comment)   Criminal Activity/Legal Involvement Pertinent to Current Situation/Hospitalization: No - Comment as needed  Activities of Daily Living Home Assistive Devices/Equipment: Shower chair with back, Oxygen, Dentures (specify type), Eyeglasses ADL Screening (condition at time of admission) Patient's cognitive ability adequate to safely complete daily activities?: Yes Is the patient deaf or have difficulty hearing?: No Does the patient have difficulty seeing, even when wearing glasses/contacts?: No Does the patient have difficulty concentrating, remembering, or making decisions?: No Patient able to express need for assistance with ADLs?: Yes Does the patient have difficulty dressing or bathing?: No Independently performs ADLs?: Yes (appropriate for developmental age) Does the patient have difficulty walking or climbing stairs?: No Weakness of Legs: None Weakness of Arms/Hands: None  Permission Sought/Granted Permission sought to share information with : Case Manager Permission granted to share information with : Yes, Verbal Permission Granted  Share Information with NAME: spouse  Permission granted to share info w AGENCY: [place] agency        Emotional Assessment Appearance:: Developmentally  appropriate Attitude/Demeanor/Rapport: Gracious, Engaged Affect (typically observed): Accepting Orientation: : Oriented to Self, Oriented to  Time, Oriented to Place, Oriented to Situation Alcohol / Substance Use: Not Applicable Psych  Involvement: No (comment)  Admission diagnosis:  SOB Patient Active Problem List   Diagnosis Date Noted  . Acute on chronic systolic CHF (congestive heart failure) ([place]) 03/06/2019  . Diabetes mellitus type 2 in obese ([place])   . Primary osteoarthritis of right hip   . Hypomagnesemia   . Steroid-induced hyperglycemia   . Supplemental oxygen dependent   . Leukocytosis   . Hypokalemia   . Acute on chronic anemia   . Diabetic peripheral neuropathy ([place])   . Acute on chronic systolic (congestive) heart failure ([place])   . Acute on chronic respiratory failure ([place]) 01/14/2019  . Hypoxia   . Altered mental status   . Heart failure ([place]) 12/30/2018  . AKI (acute kidney injury) ([place])   . Acute respiratory failure ([place]) 11/22/2018  . Acute on chronic respiratory failure with hypoxia and hypercapnia ([place]) 11/13/2018  . Metabolic encephalopathy 37/34/2876  . Acute respiratory failure with hypoxia and hypercapnia ([place]) 07/26/2018  . Acute exacerbation of CHF (congestive heart failure) ([place]) 07/26/2018  . CHF exacerbation ([place]) 05/01/2018  . CHF (congestive heart failure) ([place]) 04/30/2018  . Chronic respiratory failure with hypoxia ([place]) 12/28/2017  . Atrial fibrillation with RVR ([place])   . SVT (supraventricular tachycardia) ([place])   . COPD GOLD 0   . Medically noncompliant   . Panlobular emphysema ([place])   . OSA (obstructive sleep apnea)   . Pulmonary hypertension ([place])   . Disorientation   . Pressure injury of skin 09/07/2016  . Acute on chronic combined systolic and diastolic CHF (congestive heart failure) ([place]) 09/05/2016  . Skin lesion-left heal 09/05/2016  . Chronic systolic CHF (congestive heart failure) ([place]) 07/16/2016  . COPD (chronic obstructive pulmonary disease) ([place]) 07/16/2016  . Uncontrolled type 2 diabetes mellitus with complication ([place])   . Diabetic polyneuropathy associated with diabetes mellitus due to underlying condition ([place])   . Normocytic anemia 06/29/2016  . CAD -  Non-obstructive by LHC1/16 01/21/2016  . Nonischemic cardiomyopathy ([place]) 10/09/2015  . PAF (paroxysmal atrial fibrillation) ([place])   . Chronic pain 02/19/2015  . Morbid obesity ([place]) 02/13/2015  . Dyspnea   . Elevated troponin I level 11/01/2014  . COPD exacerbation ([place]) 11/01/2014  . Essential hypertension 10/31/2014  . Type 2 diabetes mellitus with neuropathy 10/31/2014  . Hyperlipidemia  10/31/2014  . Cigarette smoker 10/31/2014   PCP:  Clinic, [place]:   [facility], [address] [place] [address] Phone: [phone] Fax: [phone]  [facility] of [place], [place] - [address] [place] [address] Phone: [phone] Fax: [place], [place] - [id number] [place] [address] Phone: [phone] Fax: [phone]  [place], [place] - [address] [place] [address] Phone: [phone] Fax: [phone]     Social Determinants of Health (SDOH) Interventions    Readmission Risk Interventions No flowsheet data found.

## 2019-03-07 NOTE — Progress Notes (Signed)
PHARMACY - PHYSICIAN COMMUNICATION CRITICAL VALUE ALERT - BLOOD CULTURE IDENTIFICATION (BCID)  Nicholas Caldwell is an 73 y.o. male who presented to [facility] on 03/06/2019 with a chief complaint of SOB.  Assessment:  Here with acute on chronic HF causing respiratory failure.  Afebrile and WBC WNL.  BCID showed 1 of 4 bottles growing MRSE, likely a contaminant  Name of physician (or Provider) Contacted: Dr. Roger Shelter  Current antibiotics: None  Changes to prescribed antibiotics recommended:  Recommendations accepted by provider - no antibiotics needed Consider repeating blood cultures  Results for orders placed or performed during the hospital encounter of 03/06/19  Blood Culture ID Panel (Reflexed) (Collected: 03/06/2019  6:54 PM)  Result Value Ref Range   Enterococcus species NOT DETECTED NOT DETECTED   Listeria monocytogenes NOT DETECTED NOT DETECTED   Staphylococcus species DETECTED (A) NOT DETECTED   Staphylococcus aureus (BCID) NOT DETECTED NOT DETECTED   Methicillin resistance DETECTED (A) NOT DETECTED   Streptococcus species NOT DETECTED NOT DETECTED   Streptococcus agalactiae NOT DETECTED NOT DETECTED   Streptococcus pneumoniae NOT DETECTED NOT DETECTED   Streptococcus pyogenes NOT DETECTED NOT DETECTED   Acinetobacter baumannii NOT DETECTED NOT DETECTED   Enterobacteriaceae species NOT DETECTED NOT DETECTED   Enterobacter cloacae complex NOT DETECTED NOT DETECTED   Escherichia coli NOT DETECTED NOT DETECTED   Klebsiella oxytoca NOT DETECTED NOT DETECTED   Klebsiella pneumoniae NOT DETECTED NOT DETECTED   Proteus species NOT DETECTED NOT DETECTED   Serratia marcescens NOT DETECTED NOT DETECTED   Haemophilus influenzae NOT DETECTED NOT DETECTED   Neisseria meningitidis NOT DETECTED NOT DETECTED   Pseudomonas aeruginosa NOT DETECTED NOT DETECTED   Candida albicans NOT DETECTED NOT DETECTED   Candida glabrata NOT DETECTED NOT DETECTED   Candida krusei NOT DETECTED NOT DETECTED    Candida parapsilosis NOT DETECTED NOT DETECTED   Candida tropicalis NOT DETECTED NOT DETECTED    Milam Allbaugh D. Mina Marble, PharmD, BCPS, [place] 03/07/2019, 4:28 PM

## 2019-03-07 NOTE — Progress Notes (Signed)
PT Cancellation Note  Patient Details Name: Nicholas Caldwell MRN: [id number] DOB: Jun 30, 1946   Cancelled Treatment:    Reason Eval/Treat Not Completed: Patient declined, no reason specified  Pt getting blood and reports feeling tired and does not want to work with PT. Will follow up as time allows.  Marguarite Arbour A Aubriella Perezgarcia 03/07/2019, 1:13 PM Wray Kearns, PT, DPT Acute Rehabilitation Services Pager [phone] Office [phone]

## 2019-03-07 NOTE — Consult Note (Signed)
Advanced Heart Failure Team Consult Note   Primary Physician: [facility] PCP-Cardiologist:  Dr [name]  Reason for Consultation: A/C heart failure  HPI:    Nicholas Caldwell is seen today for evaluation of A/C heart failure at the request of Dr Roger Shelter.   Nicholas Caldwell a 73 y.o.malePMH of chronic systolic CHF due to NICM (EF 40-45%), COPD on 3 L O2, DM, HTN, OHS/OSA on CPAP, PAF s/p DCCV, hyperlipidemia, CAD, and noncompliance.  Admitted 2/10 ->12/19/16 with acute on chronic respiratory failure. Pt failed Bipap and required intubation. Developed Afib with RVR and 12/14/16 cardioverted x 3 ->NSR then developed SVT, DCCV x 2 ->Atrial bigeminy ->finally NSR. Home meds adjusted.   Admitted 5/49-06/05/63 with A/C systolic HF and COPD exacerbation. Diuresed with IV lasix. Treated with steroids for COPD exacerbation. Troponin was elevated, so AHF consulted. Not thought to be ACS. Torsemide held for AKI, but resumed torsemide 40 mg BID prior to discharge. DC weight: 272 lbs.   Admitted 1/20 with COPD exacerbation and CHF exacerbation.  Readmitted in 1/20 with CHF exacerbation.  Admitted again in 2/20 with CHF and COPD as well as atrial fibrillation. He ended up intubated with suspected LLL PNA.  He was sent to CIR then home.   He had a virtual visit with Dr [name] 02/01/19. He was up 5 lbs to 262 lbs and he was instructed to take metolazone on Wednesday, Saturday, and then start taking every Wednesday. He was supposed to follow up in 1 week for clinic visit, but did not happen (unclear if this got scheduled). HF paramedicine and [facility] have been unable to get in contact with patient for visits.    He presented to [facility] 03/06/19 with worsening SOB over 1 week. Found to have anasarca, cool extremities, and tachycardia. O2 sats 85% on arrival. He was given empiric Vanc and Cefepime. EKG showed that he is back in Afib 124 bpm. Per wife's report, pt was not compliant with CPAP and  unsure if he was taking any of his medications.   Pertinent admission labs include: K 4.7, creatinine 1.12, BNP 1020, troponin 0.05 -> 0.04, WBC 10.3, Hgb 6.7, ammonia normal 24, lactic acid 1.6 -> 2.6 -> 1.0, PCT <0.10 -> 0.12, iron sats low 12, COVID-19 negative, UA negative. Blood and urine cx pending  CXR: Bilateral interstitial and alveolar airspace opacities with stable cardiomegaly. Differential considerations include pulmonary edema versus multilobar pneumonia.  He was given 1u PRBC and IV lasix. Sats now stable on 3L [place]. Creatinine stable. Weight down 2 lbs, down 272 lbs. Remains in Afib/flutter 90-100s. SBP stable 110-130s. Tmax 99.0. Hgb only 7.0 this morning after 1u pRBC.   Cardiac Studies: - LHC (1/16) with mild nonobstructive CAD.  - Echo (12/17): EF 35-40%, mildly dilated RV with mild to moderately decreased RV systolic function, PASP 57 mmHg.  - Echo (3/20): EF 45-50%, mild LVH, D-shaped septum with severe RV dilation and RV function.  - TEE (3/20): EF 40-45%, moderate LVH, severe RV dilation with moderate dysfunction.  - RHC (2/20): mean RA 10, PA 52/16 mean 30, mean PCWP 10, CI 3.85, PVR 2.2 WU  Review of Systems: [y] = yes, _0  = no   . General: Weight gain [ y]; Weight loss _1 ; Anorexia _2 ; Fatigue _3 ; Fever _4 ; Chills _5 ; Weakness _6   . Cardiac: Chest pain/pressure _7 ; Resting SOB _8 ; Exertional SOB Blue.Reese ]; Orthopnea _9 ; Pedal Edema Blue.Reese ];  Palpitations _0 ; Syncope _1 ; Presyncope _2 ; Paroxysmal nocturnal dyspnea_3   . Pulmonary: Cough _4 ; Wheezing_5 ; Hemoptysis_6 ; Sputum _7 ; Snoring _8   . GI: Vomiting_9 ; Dysphagia_10 ; Melena_11 ; Hematochezia _12 ; Heartburn_13 ; Abdominal pain _14 ; Constipation _15 ; Diarrhea _16 ; BRBPR _17   . GU: Hematuria_18 ; Dysuria _19 ; Nocturia_20   . Vascular: Pain in legs with walking _21 ; Pain in feet with lying flat _22 ; Non-healing sores _23 ; Stroke _24 ; TIA _25 ; Slurred speech _26 ;  . Neuro: Headaches_27 ; Vertigo_28 ; Seizures_29 ;  Paresthesias_30 ;Blurred vision _31 ; Diplopia _32 ; Vision changes _33   . Ortho/Skin: Arthritis _34 ; Joint pain _35 ; Muscle pain _36 ; Joint swelling _37 ; Back Pain _38 ; Rash _39   . Psych: Depression_40 ; Anxiety_41   . Heme: Bleeding problems _42 ; Clotting disorders _43 ; Anemia Blue.Reese ]  . Endocrine: Diabetes _44 ; Thyroid dysfunction_45   Home Medications Prior to Admission medications   Medication Sig Start Date End Date Taking? Authorizing Provider  diazepam (VALIUM) 5 MG tablet Take 5 mg by mouth daily as needed for anxiety.   Yes [provider]  gabapentin (NEURONTIN) 600 MG tablet Take 1 tablet (600 mg total) by mouth 2 (two) times daily. 01/24/19  Yes Angiulli, Lavon Paganini, PA-C  insulin aspart protamine - aspart (NOVOLOG MIX 70/30 FLEXPEN) (70-30) 100 UNIT/ML FlexPen Inject 0.05 mLs (5 Units total) into the skin 2 (two) times daily. Patient taking differently: Inject 30 Units into the skin 2 (two) times daily.  08/02/18  Yes Regalado, Belkys A, MD  ipratropium-albuterol (DUONEB) 0.5-2.5 (3) MG/3ML SOLN Take 3 mLs by nebulization 2 (two) times daily as needed (shortness of breath/wheezing).    Yes [provider]  isosorbide-hydrALAZINE (BIDIL) 20-37.5 MG tablet Take 1 tablet by mouth 3 (three) times daily. Patient taking differently: Take 1 tablet by mouth 2 (two) times a day.  01/24/19  Yes Angiulli, Lavon Paganini, PA-C  magnesium oxide (MAG-OX) 400 MG tablet Take 400 mg by mouth daily.   Yes [provider]  metFORMIN (GLUCOPHAGE-XR) 500 MG 24 hr tablet Take 1,000 mg by mouth 2 (two) times daily with a meal.   Yes [provider]  metolazone (ZAROXOLYN) 2.5 MG tablet Take 1 tablet (2.5 mg total) by mouth every Wednesday. Take extra Potassium 24mq (40 total, daily) when taking this medication 02/02/19 05/03/19 Yes MLarey Dresser MD  oxyCODONE-acetaminophen (PERCOCET) 10-325 MG tablet Take 1 tablet by mouth every 6 (six) hours.   Yes [provider]  OXYGEN  Inhale 3 L into the lungs continuous.    Yes [provider]  polyethylene glycol (MIRALAX / GLYCOLAX) packet Take 17 g by mouth daily. Patient taking differently: Take 17 g by mouth daily as needed (constipation).  01/24/19  Yes Angiulli, DLavon Paganini PA-C  potassium chloride SA (K-DUR) 20 MEQ tablet Take 40 mEq by mouth See admin instructions. Take 2 tablets (40 meq) by mouth every morning, take an additional tablet (20 meq) at night if taking metolazone that day.   Yes [provider]  rivaroxaban (XARELTO) 20 MG TABS tablet Take 1 tablet (20 mg total) by mouth daily with supper. Patient taking differently: Take 20 mg by mouth at bedtime.  01/20/17  Yes VJettie Booze MD  rosuvastatin (CRESTOR) 40 MG tablet Take 20 mg by mouth daily.   Yes [provider]  torsemide (DEMADEX) 20 MG tablet Take 4 tablets (80 mg total) by mouth 2 (two) times daily for 30 days. 01/24/19 03/10/19 Yes Angiulli, Lavon Paganini, PA-C  acetaminophen (TYLENOL) 160 MG/5ML solution Take 20.3 mLs (650 mg total) by mouth every 6 (six) hours as needed for mild pain, headache or fever. Patient not taking: Reported on 03/06/2019 01/24/19   Angiulli, Lavon Paganini, PA-C  albuterol (VENTOLIN HFA) 108 (90 Base) MCG/ACT inhaler Inhale 2 puffs into the lungs every 6 (six) hours as needed for wheezing or shortness of breath.    [provider]  bisoprolol (ZEBETA) 5 MG tablet Take 0.5 tablets (2.5 mg total) by mouth daily for 30 days. 01/24/19 02/23/19  Angiulli, Lavon Paganini, PA-C  budesonide-formoterol (SYMBICORT) 160-4.5 MCG/ACT inhaler Inhale 2 puffs into the lungs 2 (two) times daily.    [provider]  carvedilol (COREG) 25 MG tablet Take 12.5 mg by mouth 2 (two) times daily with a meal.    [provider]  diclofenac sodium (VOLTAREN) 1 % GEL Apply 2 g topically 4 (four) times daily. 01/24/19   Angiulli, Lavon Paganini, PA-C  ferrous sulfate 325 (65 FE) MG tablet Take 325 mg by mouth See admin  instructions. Take one tablet by mouth twice daily on Monday, Wednesday, Friday    [provider]  fluticasone (FLONASE) 50 MCG/ACT nasal spray Place 2 sprays into both nostrils daily as needed for allergies.     [provider]  folic acid (FOLVITE) 1 MG tablet Take 1 mg by mouth daily.    [provider]  guaifenesin (HUMIBID E) 400 MG TABS tablet Take 1 tablet (400 mg total) by mouth every 6 (six) hours as needed. Patient taking differently: Take 400 mg by mouth every 6 (six) hours as needed (cough).  08/02/18   Regalado, Belkys A, MD  hydrocortisone (ANUSOL-HC) 2.5 % rectal cream Place 1 application rectally 2 (two) times daily as needed for hemorrhoids or itching.     [provider]  hydrocortisone 1 % lotion Apply 1 application topically See admin instructions. Apply small amount to back twice daily for itchy rash    [provider]  latanoprost (XALATAN) 0.005 % ophthalmic solution Place 1 drop into both eyes at bedtime.    [provider]  Magnesium Oxide 420 (252 Mg) MG TABS Take 420 mg by mouth 2 (two) times daily. Patient not taking: Reported on 03/06/2019 01/24/19   Angiulli, Lavon Paganini, PA-C  Multiple Vitamin (MULTIVITAMIN WITH MINERALS) TABS tablet Take 1 tablet by mouth daily. Patient not taking: Reported on 03/06/2019 01/25/19   Angiulli, Lavon Paganini, PA-C  pantoprazole (PROTONIX) 40 MG tablet Take 1 tablet (40 mg total) by mouth 2 (two) times daily. 01/24/19   Angiulli, Lavon Paganini, PA-C  potassium chloride SA (K-DUR,KLOR-CON) 20 MEQ tablet Take 2 tablets (40 mEq total) by mouth every morning AND 1 tablet (20 mEq total) at bedtime. Patient not taking: Reported on 03/06/2019 11/26/18   Damita Lack, MD  rosuvastatin (CRESTOR) 20 MG tablet Take 1 tablet (20 mg total) by mouth daily. Patient not taking: Reported on 03/06/2019 01/24/19   Angiulli, Lavon Paganini, PA-C  sodium chloride (OCEAN) 0.65 % SOLN nasal spray Place 2 sprays into both nostrils 4  (four) times daily.    [provider]    Past Medical History: Past Medical History:  Diagnosis Date  . Atrial flutter ([place])    a. recurrent AFlutter with RVR;  b. Amiodarone Rx started 4/16  .  CAD (coronary artery disease)    a. LHC 1/16:  mLAD diffuse disease, pLCx mild disease, dLCx with disease but too small for PCI, RCA ok, EF 25-30%  . Chronic pain   . Chronic systolic CHF (congestive heart failure) ([place])   . COPD (chronic obstructive pulmonary disease) ([place])   . Diabetes mellitus without complication ([place])   . Hypercholesteremia   . Hypertension   . NICM (nonischemic cardiomyopathy) ([place])    a.dx 2016. b. 2D echo 06/2016 - Last echo 07/01/16: mod dilated LV, mod LVH, EF 25-30%, mild-mod MR, sev LAE, mild-mod reduced RV systolic function, mild-mod TR, PASP 33mHG.  .Marland KitchenPAF (paroxysmal atrial fibrillation) ([facility])    On amio - ot a candidate for flecainide due to cardiomyopathy, not a candidate for Tikosyn due to prolonged QT, and felt to be a poor candidate for ablation given left atrial size.  . Pulmonary hypertension ([facility]   . Tobacco abuse     Past Surgical History: Past Surgical History:  Procedure Laterality Date  . CARDIOVERSION N/A 07/30/2018   Procedure: CARDIOVERSION;  Surgeon: MLarey Dresser MD;  [facility];  Service: Cardiovascular;  Laterality: N/A;  . LEFT AND RIGHT HEART CATHETERIZATION WITH CORONARY ANGIOGRAM N/A 11/06/2014   Procedure: LEFT AND RIGHT HEART CATHETERIZATION WITH CORONARY ANGIOGRAM;  Surgeon: JJettie Booze MD;  [facility];  Service: Cardiovascular;  Laterality: N/A;  . RIGHT/LEFT HEART CATH AND CORONARY ANGIOGRAPHY N/A 12/06/2018   Procedure: RIGHT/LEFT HEART CATH AND CORONARY ANGIOGRAPHY;  Surgeon: MLarey Dresser MD;  [facility];  Service: Cardiovascular;  Laterality: N/A;  . SPLENECTOMY      Family History: Family History  Problem Relation Age of Onset  . Heart disease Mother   .  Hypertension Mother   . Heart failure Mother   . Heart disease Father   . Hypertension Sister   . Heart attack Neg Hx   . Stroke Neg Hx     Social History: Social History   Socioeconomic History  . Marital status: Married    Spouse name: Not on file  . Number of children: Not on file  . Years of education: Not on file  . Highest education level: Not on file  Occupational History  . Not on file  Social Needs  . Financial resource strain: Not on file  . Food insecurity:    Worry: Not on file    Inability: Not on file  . Transportation needs:    Medical: Not on file    Non-medical: Not on file  Tobacco Use  . Smoking status: Former Smoker    Packs/day: 1.00    Years: 34.00    Pack years: 34.00    Types: Cigarettes    Last attempt to quit: 11/30/2018    Years since quitting: 0.2  . Smokeless tobacco: Never Used  Substance and Sexual Activity  . Alcohol use: No    Alcohol/week: 0.0 standard drinks  . Drug use: No  . Sexual activity: Not on file  Lifestyle  . Physical activity:    Days per week: Not on file    Minutes per session: Not on file  . Stress: Not on file  Relationships  . Social connections:    Talks on phone: Not on file    Gets together: Not on file    Attends religious service: Not on file    Active member of club or organization: Not on file    Attends meetings of  clubs or organizations: Not on file    Relationship status: Not on file  Other Topics Concern  . Not on file  Social History Narrative  . Not on file    Allergies:  No Known Allergies  Objective:    Vital Signs:   Temp:  [97 F (36.1 C)-99.3 F (37.4 C)] 98 F (36.7 C) (05/04 0731) Pulse Rate:  [93-124] 124 (05/04 0731) Resp:  [18-30] 20 (05/04 0731) BP: (104-130)/(52-74) 119/63 (05/04 0731) SpO2:  [85 %-100 %] 97 % (05/04 0731) Weight:  [120.2 kg-125 kg] 123.4 kg (05/04 0121) Last BM Date: 03/05/19  Weight change: Filed Weights   03/06/19 1803 03/07/19 0012 03/07/19  0121  Weight: 120.2 kg 125 kg 123.4 kg    Intake/Output:   Intake/Output Summary (Last 24 hours) at 03/07/2019 0753 Last data filed at 03/07/2019 0733 Gross per 24 hour  Intake 1192.7 ml  Output 1500 ml  Net -307.3 ml      Physical Exam    General:   No resp difficulty HEENT: normal Neck: supple. JVP 16 cm. Carotids 2+ bilat; no bruits. No lymphadenopathy or thyromegaly appreciated. Cor: PMI nondisplaced. Irregular rate & rhythm. No rubs, gallops or murmurs. Lungs: Diffuse wheezing bilaterally.  Abdomen: soft, nontender, nondistended. No hepatosplenomegaly. No bruits or masses. Good bowel sounds. Extremities: no cyanosis, clubbing, rash.  1+ edema to knees.  Neuro: alert & orientedx3, cranial nerves grossly intact. moves all 4 extremities w/o difficulty. Affect pleasant   Telemetry   Atrial flutter rate 70s with 1 short NSVT run.  Personally reviewed.   EKG    Atrial flutter 97 bpm. Personally reviewed.   Labs   Basic Metabolic Panel: Recent Labs  Lab 03/06/19 1829 03/06/19 1839 03/07/19 0524  NA 136 138 141  K 4.7 4.7 4.5  CL 95*  --  99  CO2 29  --  35*  GLUCOSE 97  --  113*  BUN 18  --  19  CREATININE 1.12  --  1.19  CALCIUM 8.6*  --  8.3*    Liver Function Tests: Recent Labs  Lab 03/06/19 1829  AST 16  ALT 12  ALKPHOS 120  BILITOT 0.4  PROT 7.4  ALBUMIN 3.2*   No results for input(s): LIPASE, AMYLASE in the last 168 hours. Recent Labs  Lab 03/06/19 1854  AMMONIA 24    CBC: Recent Labs  Lab 03/06/19 1829 03/06/19 1839 03/06/19 2020 03/07/19 0524  WBC 10.3  --   --  10.4  NEUTROABS 8.1*  --   --   --   HGB 6.7* 8.5* 6.7* 7.0*  HCT 24.7* 25.0* 24.2* 24.3*  MCV 86.7  --   --  83.2  PLT 625*  --   --  582*    Cardiac Enzymes: Recent Labs  Lab 03/06/19 1829 03/06/19 2103  TROPONINI 0.05* 0.04*    BNP: BNP (last 3 results) Recent Labs    12/01/18 0015 12/30/18 1621 03/06/19 1829  BNP 1,666.7* 1,273.0* 1,020.1*    ProBNP  (last 3 results) No results for input(s): PROBNP in the last 8760 hours.   CBG: Recent Labs  Lab 03/06/19 2244 03/07/19 0637  GLUCAP 79 103*    Coagulation Studies: No results for input(s): LABPROT, INR in the last 72 hours.   Imaging   Dg Chest 2 View  Result Date: 03/06/2019 CLINICAL DATA:  Shortness of breath EXAM: CHEST - 2 VIEW COMPARISON:  01/11/2011 FINDINGS: Bilateral interstitial and alveolar airspace opacities. Small left pleural  effusion. No pneumothorax. Stable cardiomegaly. No acute osseous abnormality. IMPRESSION: Bilateral interstitial and alveolar airspace opacities with stable cardiomegaly. Differential considerations include pulmonary edema versus multilobar pneumonia. Electronically Signed   By: Kathreen Devoid   On: 03/06/2019 19:07      Medications:     Current Medications: . sodium chloride   Intravenous Once  . bisoprolol  2.5 mg Oral Daily  . ferrous sulfate  325 mg Oral 2 times per day on Mon Wed Fri  . folic acid  1 mg Oral Daily  . furosemide  80 mg Intravenous BID  . gabapentin  600 mg Oral BID  . insulin aspart  0-15 Units Subcutaneous TID WC  . insulin NPH Human  10 Units Subcutaneous BID AC & HS  . ipratropium-albuterol  3 mL Nebulization BID  . isosorbide-hydrALAZINE  1 tablet Oral TID  . latanoprost  1 drop Both Eyes QHS  . mouth rinse  15 mL Mouth Rinse BID  . mometasone-formoterol  2 puff Inhalation BID  . pantoprazole  40 mg Oral BID  . polyethylene glycol  17 g Oral Daily  . potassium chloride  20 mEq Oral BID  . rosuvastatin  20 mg Oral q1800  . sodium chloride flush  3 mL Intravenous Q12H     Infusions: . sodium chloride         Patient Profile   Nicholas Caldwell a 73 y.o.malePMH of chronic systolic CHF due to NICM (EF 40-45%), COPD on 3 L O2, DM, HTN, OHS/OSA on CPAP, PAF s/p DCCV, hyperlipidemia, CAD, and noncompliance.  Admitted with SOB and anemia.  Assessment/Plan   1. Acute on Chronic systolic CHF: With  prominent RV failure, likely related to COPD/OSA/OHS. TEE in 3/20 with EF 40-45%, severely dilated RV with moderately decreased systolic function. He has had multiple recent admissions with CHF and COPD exacerbations.  - Volume overloaded - Continue 80 mg IV lasix BID.  -Continue bisprolol 2.5 mg daily.  - Continue Bidil 1 tab tid. - Working up for cardiomems as outpatient  2. Anemia - Hgb 6.7 -> 1u PRBC -> 7.0 - Hemoccult card pending - Iron low. Give IV iron per pharmD - Probably needs more blood and GI consult. Given blood shortage, will give IV iron this am and defer to primary team.   3. ID - Received Vanc/Cefepime yesterday in ED - Tmax only 99.0. WBC 10.4. COVID-19 negative. PCT 0.12 - UA negative. Blood and urine cx pending  4. Atrial fibrillation: Paroxysmal.  He had DCCV in 3/20.  - He is atrial flutter by EKG in arrival - Xarelto on hold due to anemia. See above.  - Has previously failed amioadrone and poor tikosyn candidate with long QT. Now following with EP for possible long QT syndrome.  - Poor ablation candidate with LA size - If he does not convert, would need TEE/DCCV given that xarelto is on hold. GIB would need to be resolved first as he would need to stay on AC.  5. COPD: Continue home oxygen. CT chest showed emphysema.  - Dr. Opal Sidles - No longer smoking  6. OHS/OSA: Continue oxygen at night and CPAP during the day.   - Reportedly noncompliant with CPAP. Will order while in house. VBG with CO2 56 (normal)  7. CAD: Nonobstructive disease in 2/20. No CP. - Continue crestor 20 mg daily. Check lipid panel tomorrow am - Xarelto on hold as above.   Medication concerns reviewed with patient and pharmacy team. Barriers identified: compliance  Length of Stay: Caldwell, NP  03/07/2019, 7:53 AM  Advanced Heart Failure Team Pager [phone] (M-F; 7a - 4p)  Please contact [facility] for night-coverage after hours (4p -7a ) and weekends on  [email or web address]  Patient seen with NP, agree with the above note.   Patient returns with worsening dyspnea and weight gain.  CXR with CHF, volume overload on exam.  He was also noted to be anemic with hgb 6.7 though he denies overt bleeding.  COVID-19 negative.  Afebrile without significant WBC elevation.  He was in atrial flutter with mild RVR at admission.   He says he has been compliant with meds and CPAP.  His wife told admitting team that he has not been compliant.  He has not cooperated with [facility] or paramedicine visits at home prior to admission.   On exam, JVP 16 cm, diffuse bilateral wheezes, 1+ edema to knees, irregular S1S2.   1. Acute on chronic systolic CHF: Nonischemic cardiomyopathy.  With prominent RV failure, likely related to COPD/OSA/OHS. TEE in 3/20 with EF 40-45%, severely dilated RV with moderately decreased systolic function. He has had multiple recent admissions with CHF and COPD exacerbations.  He has not cooperated with paramedicine or [facility] home health as outpatient, has not been using CPAP per his wife, suspect significant medication noncompliance as well though he denies.  On exam today, he is markedly volume overloaded.  CXR with pulmonary edema.  BNP elevated. Creatinine stable.  - Lasix 80 mg IV bid today, got dose of metolazone overnight as well.  Followup UOP today, increase diuretic intensity if poor response today.  - Continue bisoprolol 2.5 daily.  - Continue Bidil 1 tab tid.  - If creatinine remains stable, add spironolactone, ARB versus Entresto if he has BP room.  - Cardiomems may be helpful as outpatient.  2. Anemia: Baseline hgb around 8.  6.7 at admission, denies over GI bleeding.  He has had 1 unit PRBCs, hgb 7 today. Transferrin saturation 3%.  - Follow closely, will transfuse hgb < 7.  - Hold Xarelto for now.  - Need GI consult for endoscopy.  Ideally, would have him on Xarelto to allow TEE-guided DCCV to get him out of atrial flutter but need GI workup  first.  - Will give IV Fe.  3. Atrial fibrillation/flutter: Patient has history of paroxysmal atrial fibrillation.  This admission, he appears to be in an atypical atrial flutter.  Rate is currently controlled. Xarelto on hold with GI bleeding.  Was seen in past by Dr. Rayann Heman, not thought to be good ablation candidate.  He has not been on Tikosyn or amiodarone due to history of long QT.  - Needs GI workup for source of anemia.  When he is back on Xarelto, would plan TEE-guided DCCV.  - Will eventually ask EP to reconsider ablation given lack of good anti-arrhythmic options. He seems to do better in NSR.  4. COPD: Severe, on 3L home oxygen.  5. OHS/OSA: On home oxygen.  Per wife, has not been using CPAP at home.  6. ID: Suspect this is primarily a CHF exacerbation.  Afebrile and WBCs not significantly elevated.  Cultures negative so far.  COVID-19 negative.  7. CAD: Nonobstructive on prior cath.  He is on Crestor.   PT consult, out of bed.   Loralie Champagne 03/07/2019 10:39 AM

## 2019-03-07 NOTE — Progress Notes (Signed)
Beeped MD alert of hr down to 35&38 non sustained this am patient in aflutter recovered heart rate instantly asytomatic of heart rate drop,he has also had 6 beat run Vtach this am after receiving breathing tx beedped Dr.shahmehdi to inform. No further changes noted.

## 2019-03-07 NOTE — Progress Notes (Signed)
PROGRESS NOTE    Patient: Nicholas Caldwell                            PCP: [facility]                    DOB: 08-Sep-1946            DOA: 03/06/2019 EPP:[id number]             DOS: 03/07/2019, 12:39 PM   LOS: 1 day   Date of Service: The patient was seen and examined on 03/07/2019  Subjective:   Patient was seen and examined this morning, still complaining some shortness of breath.  Having overall edema.  But denies any chest pain.   Brief Narrative:   Nicholas Caldwell is a 73 y.o. male with medical history significant of combined systolic and diastolic heart failure,ejection fraction 40 to 45%, PAF on Xarelto, COPD noncompliant with inhalers, OSA doesn't use CPAP.  Patient presents to the ED with 1 week history of SOB.  Patient states he is compliant with his torsemide, wife states she doesn't know.  Patient with significant peripheral edema.  Thinks he might have had fever at home but hasnt checked it.  No diarrhea, no melena, no hematochezia.   ED Course: Pt satting 85% initially with tachypnea and tachycardia.  Got empiric doses of cefepime and vanc.  Patient with gross anasarca, BNP 1000, CXR is C/W pulmonary edema.  Patient breathing stabilized after neb treatments, oxygen, 40m IV lasix.  HR 124 in A.Fib. WBC 10.3k, HGB 6.7.  1u PRBC transfusion ordered.   Principal Problem:   Acute on chronic respiratory failure with hypoxia and hypercapnia (HCC) Active Problems:   Essential hypertension   Type 2 diabetes mellitus with neuropathy   PAF (paroxysmal atrial fibrillation) (HCC)   COPD (chronic obstructive pulmonary disease) (HCC)   Medically noncompliant   Atrial fibrillation with RVR (HCC)   Acute on chronic anemia   Acute on chronic systolic (congestive) heart failure (HCC)   Acute on chronic systolic CHF (congestive heart failure) (HCC)    Assessment & Plan:   Anasarca/volume overload secondary to/  acute on chronic systolic CHF causing respiratory  failure  -Continue with CHF pathway,  -Lasix 837mIV BID (Holding the PO torsemide) -Continue bisprolol (Zaroxolyn) 2.5 mg daily. -Continue Bidil 1 tab tid.- -Continue with the Foley cath, strict I's and O's, daily weight -Monitoring potassium level and depleting with diuretics. -Cardiology Dr. McAundra Dubinonsulted, treated CHF subspeciality input  Acute on chronic anemia -iron deficiency  -No gross blood on rectal exam per EDP  - Hemoccult pending  -Holding Xarelto for the moment  -? slow GIB, note that he had HGB drifting down during the March admit as well.  -Anemia panel; iron low at 12, TIBC 437, ferritin 8, folate 13  -1u PRBC transfusion -hemoglobin improved from 6.7 >> 7.0, pending repeat H&H  -Monitoring H&H closely  -We will initiate  iron supplements  COPD -  - Resume home nebs (which it sounds like he wasn't taking anyhow per wife)  - 3L O2 dependent at baseline  PAF - -Continue home bisoprolol -Possibly in RVR due to albuterol given in ED, or anemia -Transfuse for anemia -If still tachycardic, then would consider short acting metoprolol overnight as next step   HTN - continue home BP meds  No signs of infection, will monitor closely,  DVT prophylaxis: SCDs Code Status: Full  Family Communication: No family in room Disposition Plan: Home after admit Consults called: Message put in to P. Abagail Kitchens for CHF consult Admission status: Admit to inpatient  Severity of Illness: The appropriate patient status for this patient is INPATIENT.    Procedures:   No admission procedures for hospital encounter.   Antimicrobials:  Anti-infectives (From admission, onward)   Start     Dose/Rate Route Frequency Ordered Stop   03/06/19 1945  ceFEPIme (MAXIPIME) 2 g in sodium chloride 0.9 % 100 mL IVPB     2 g 200 mL/hr over 30 Minutes Intravenous  Once 03/06/19 1941 03/06/19 2046   03/06/19 1945  vancomycin (VANCOCIN) 2,000 mg in sodium chloride 0.9 % 500 mL IVPB     2,000  mg 250 mL/hr over 120 Minutes Intravenous  Once 03/06/19 1941 03/07/19 0015       Medication:  . sodium chloride   Intravenous Once  . bisoprolol  2.5 mg Oral Daily  . folic acid  1 mg Oral Daily  . furosemide  80 mg Intravenous BID  . gabapentin  600 mg Oral BID  . insulin aspart  0-15 Units Subcutaneous TID WC  . insulin NPH Human  10 Units Subcutaneous BID AC & HS  . ipratropium-albuterol  3 mL Nebulization BID  . isosorbide-hydrALAZINE  1 tablet Oral TID  . latanoprost  1 drop Both Eyes QHS  . mouth rinse  15 mL Mouth Rinse BID  . mometasone-formoterol  2 puff Inhalation BID  . pantoprazole  40 mg Oral BID  . polyethylene glycol  17 g Oral Daily  . potassium chloride  20 mEq Oral BID  . rosuvastatin  20 mg Oral q1800  . sodium chloride flush  3 mL Intravenous Q12H    sodium chloride, acetaminophen, fluticasone, guaiFENesin, ondansetron (ZOFRAN) IV, oxyCODONE-acetaminophen, sodium chloride flush     Objective:   Vitals:   03/07/19 0731 03/07/19 0800 03/07/19 0837 03/07/19 1143  BP: 119/63  100/60 107/62  Pulse: (!) 124 (!) 111 79 84  Resp: 20 (!) 22 (!) 24 (!) 22  Temp: 98 F (36.7 C)   97.9 F (36.6 C)  TempSrc:    Oral  SpO2: 97% 94% 93% 95%  Weight:      Height:        Intake/Output Summary (Last 24 hours) at 03/07/2019 1239 Last data filed at 03/07/2019 1031 Gross per 24 hour  Intake 1692.7 ml  Output 1900 ml  Net -207.3 ml   Filed Weights   03/06/19 1803 03/07/19 0012 03/07/19 0121  Weight: 120.2 kg 125 kg 123.4 kg     Examination:    General exam: Appears calm and comfortable  BP 107/62 (BP Location: Left Arm)   Pulse 84   Temp 97.9 F (36.6 C) (Oral)   Resp (!) 22   Ht 6' (1.829 m)   Wt 123.4 kg Comment: scale C  SpO2 95%   BMI 36.89 kg/m    Physical Exam  Constitution:  Alert, cooperative, shortness of breath Psychiatric: Normal and stable mood and affect, cognition intact,   HEENT: Normocephalic, PERRL, otherwise with in Normal  limits  Chest:Chest symmetric, diminished breath sounds to the from the base-middle lobes Cardio vascular:  S1/S2, RRR, No murmure, No Rubs or Gallops  pulmonary: Diminished breath sounds on the way to the middle lobe, mildly labored aspiratory effort before,  rhonchi, diffuse wheezing crackles at lower lobes Abdomen: Soft, non-tender, non-distended, bowel sounds,no masses, no organomegaly Muscular skeletal: Limited exam -  in bed, able to move all 4 extremities, Normal strength,  Neuro: CNII-XII intact. , normal motor and sensation, reflexes intact  Extremities:  +4 pitting edema lower extremities stented to the lower abdomen,, +2 pulses  Skin: Dry, warm to touch, negative for any Rashes, No open wounds Wounds: per nursing documentation  Foley catheter in place   LABs:  CBC Latest Ref Rng & Units 03/07/2019 03/06/2019 03/06/2019  WBC 4.0 - 10.5 K/uL 10.4 - -  Hemoglobin 13.0 - 17.0 g/dL 7.0(L) 6.7(LL) 8.5(L)  Hematocrit 39.0 - 52.0 % 24.3(L) 24.2(L) 25.0(L)  Platelets 150 - 400 K/uL 582(H) - -   CMP Latest Ref Rng & Units 03/07/2019 03/06/2019 03/06/2019  Glucose 70 - 99 mg/dL 113(H) - 97  BUN 8 - 23 mg/dL 19 - 18  Creatinine 0.61 - 1.24 mg/dL 1.19 - 1.12  Sodium 135 - 145 mmol/L 141 138 136  Potassium 3.5 - 5.1 mmol/L 4.5 4.7 4.7  Chloride 98 - 111 mmol/L 99 - 95(L)  CO2 22 - 32 mmol/L 35(H) - 29  Calcium 8.9 - 10.3 mg/dL 8.3(L) - 8.6(L)  Total Protein 6.5 - 8.1 g/dL - - 7.4  Total Bilirubin 0.3 - 1.2 mg/dL - - 0.4  Alkaline Phos 38 - 126 U/L - - 120  AST 15 - 41 U/L - - 16  ALT 0 - 44 U/L - - 12      Chest x-ray IMPRESSION: Bilateral interstitial and alveolar airspace opacities with stable cardiomegaly. Differential considerations include pulmonary edema versus multilobar pneumonia.

## 2019-03-07 NOTE — Progress Notes (Signed)
Dr.McLean in see patient updated on status aware of bradycardia this am nonsustained and v tach nonsustained reported aware no new orders noted.

## 2019-03-08 ENCOUNTER — Inpatient Hospital Stay (HOSPITAL_COMMUNITY): Payer: Medicare Other

## 2019-03-08 DIAGNOSIS — F17210 Nicotine dependence, cigarettes, uncomplicated: Secondary | ICD-10-CM

## 2019-03-08 DIAGNOSIS — I472 Ventricular tachycardia: Secondary | ICD-10-CM

## 2019-03-08 DIAGNOSIS — I5043 Acute on chronic combined systolic (congestive) and diastolic (congestive) heart failure: Secondary | ICD-10-CM

## 2019-03-08 DIAGNOSIS — Z716 Tobacco abuse counseling: Secondary | ICD-10-CM

## 2019-03-08 DIAGNOSIS — I4729 Other ventricular tachycardia: Secondary | ICD-10-CM

## 2019-03-08 DIAGNOSIS — R601 Generalized edema: Secondary | ICD-10-CM

## 2019-03-08 LAB — CBC
HCT: 23.3 % — ABNORMAL LOW (ref 39.0–52.0)
Hemoglobin: 6.9 g/dL — CL (ref 13.0–17.0)
MCH: 24.4 pg — ABNORMAL LOW (ref 26.0–34.0)
MCHC: 29.6 g/dL — ABNORMAL LOW (ref 30.0–36.0)
MCV: 82.3 fL (ref 80.0–100.0)
Platelets: 549 10*3/uL — ABNORMAL HIGH (ref 150–400)
RBC: 2.83 MIL/uL — ABNORMAL LOW (ref 4.22–5.81)
RDW: 21.6 % — ABNORMAL HIGH (ref 11.5–15.5)
WBC: 9 10*3/uL (ref 4.0–10.5)
nRBC: 6.5 % — ABNORMAL HIGH (ref 0.0–0.2)

## 2019-03-08 LAB — BASIC METABOLIC PANEL
Anion gap: 13 (ref 5–15)
BUN: 27 mg/dL — ABNORMAL HIGH (ref 8–23)
CO2: 35 mmol/L — ABNORMAL HIGH (ref 22–32)
Calcium: 8.3 mg/dL — ABNORMAL LOW (ref 8.9–10.3)
Chloride: 92 mmol/L — ABNORMAL LOW (ref 98–111)
Creatinine, Ser: 1.62 mg/dL — ABNORMAL HIGH (ref 0.61–1.24)
GFR calc Af Amer: 49 mL/min — ABNORMAL LOW (ref >60)
GFR calc non Af Amer: 42 mL/min — ABNORMAL LOW (ref >60)
Glucose, Bld: 114 mg/dL — ABNORMAL HIGH (ref 70–99)
Potassium: 3.7 mmol/L (ref 3.5–5.1)
Sodium: 140 mmol/L (ref 135–145)

## 2019-03-08 LAB — PREPARE RBC (CROSSMATCH)

## 2019-03-08 LAB — LIPID PANEL
Cholesterol: 83 mg/dL (ref 0–200)
HDL: 43 mg/dL (ref >40)
LDL Cholesterol: 18 mg/dL (ref 0–99)
Total CHOL/HDL Ratio: 1.9 RATIO
Triglycerides: 109 mg/dL (ref <150)
VLDL: 22 mg/dL (ref 0–40)

## 2019-03-08 LAB — GLUCOSE, CAPILLARY
Glucose-Capillary: 134 mg/dL — ABNORMAL HIGH (ref 70–99)
Glucose-Capillary: 225 mg/dL — ABNORMAL HIGH (ref 70–99)
Glucose-Capillary: 136 mg/dL — ABNORMAL HIGH (ref 70–99)
Glucose-Capillary: 150 mg/dL — ABNORMAL HIGH (ref 70–99)

## 2019-03-08 LAB — PROCALCITONIN: Procalcitonin: 0.19 ng/mL

## 2019-03-08 LAB — HEMOGLOBIN AND HEMATOCRIT, BLOOD
HCT: 27.9 % — ABNORMAL LOW (ref 39.0–52.0)
Hemoglobin: 8.1 g/dL — ABNORMAL LOW (ref 13.0–17.0)

## 2019-03-08 LAB — MAGNESIUM: Magnesium: 2.3 mg/dL (ref 1.7–2.4)

## 2019-03-08 MED ORDER — FUROSEMIDE 10 MG/ML IJ SOLN
60.0000 mg | Freq: Once | INTRAMUSCULAR | Status: AC
  Administered 2019-03-08: 60 mg via INTRAVENOUS
  Filled 2019-03-08: qty 6

## 2019-03-08 MED ORDER — METOLAZONE 2.5 MG PO TABS
2.5000 mg | ORAL_TABLET | Freq: Once | ORAL | Status: AC
  Administered 2019-03-08: 2.5 mg via ORAL
  Filled 2019-03-08: qty 1

## 2019-03-08 MED ORDER — FUROSEMIDE 10 MG/ML IJ SOLN
15.0000 mg/h | INTRAVENOUS | Status: DC
  Administered 2019-03-08: 12 mg/h via INTRAVENOUS
  Administered 2019-03-10 – 2019-03-15 (×6): 15 mg/h via INTRAVENOUS
  Administered 2019-03-16 – 2019-03-17 (×2): 10 mg/h via INTRAVENOUS
  Administered 2019-03-18 – 2019-03-20 (×4): 15 mg/h via INTRAVENOUS
  Filled 2019-03-08: qty 25
  Filled 2019-03-08: qty 21
  Filled 2019-03-08 (×9): qty 25
  Filled 2019-03-08: qty 21
  Filled 2019-03-08: qty 25
  Filled 2019-03-08 (×2): qty 20
  Filled 2019-03-08 (×4): qty 25
  Filled 2019-03-08: qty 21
  Filled 2019-03-08 (×2): qty 25

## 2019-03-08 MED ORDER — NICOTINE 7 MG/24HR TD PT24
7.0000 mg | MEDICATED_PATCH | Freq: Every day | TRANSDERMAL | Status: DC
  Administered 2019-03-11 – 2019-03-21 (×2): 7 mg via TRANSDERMAL
  Filled 2019-03-08 (×18): qty 1

## 2019-03-08 MED ORDER — SODIUM CHLORIDE 0.9% IV SOLUTION
Freq: Once | INTRAVENOUS | Status: AC
  Administered 2019-03-08: 11:00:00 via INTRAVENOUS

## 2019-03-08 NOTE — Progress Notes (Addendum)
PROGRESS NOTE                                                                                                                                                                                                             Patient Demographics:    Nicholas Caldwell, is a 73 y.o. male, DOB - 05-21-1946, PVV:[id number]  Admit date - 03/06/2019   Admitting Physician Etta Quill, DO  Outpatient Primary MD for the patient is [facility], [facility]  LOS - 2  Outpatient Specialists: Heart failure  Chief Complaint  Patient presents with  . Shortness of Breath       Brief Narrative   73 year old male with history of combined systolic and diastolic CHF with EF of 75-44% (per TEE in March), paroxysmal A. fib on Xarelto, COPD on 3 L home O2, noncompliance with inhalers with ongoing tobacco use, possibly diuretic, morbid obesity with OSA not adherent to CPAP presented with acute on chronic hypoxic respiratory failure secondary to acute on chronic combined systolic and diastolic CHF.  Patient had gross anasarca with pulmonary edema on chest x-ray and BNP of thousand.  Required nebs in the ED, higher oxygen and IV Lasix.  Admitted for aggressive IV diuresis.  Also found to have severe iron deficiency anemia.   Subjective:   Patient reports that his breathing is about 70% of his baseline.  Still quite wheezy and weak to get out of bed.  Noted for few runs of NSVT on the monitor.  Assessment  & Plan :    Principal Problem:   Acute on chronic respiratory failure with hypoxia and hypercapnia (HCC) Acute on chronic combined systolic and diastolic CHF (HCC) Suspect nonadherence to medication and diet.  Still quite volume overloaded and symptomatic (not improved from yesterday).  Continue strict I's/O and daily weight. -Heart failure team following.  Switch IV Lasix 80 mg q12 hours to Lasix drip.  Continue metolazone. Continue bisoprolol and BiDil.   Recommend to add Aldactone if creatinine improves.  Add ARB versus Entresto if blood pressure stable. May need RHC if creatinine continues to worsen and diuresis difficult. - counseled strongly on diet and medication adherence.   Active Problems: Severe iron deficiency anemia. Baseline hemoglobin around 8-9.  Received 1 unit PRBC and Feraheme yesterday and is still 6.9.  Ordered another unit of PRBC today.  GI plan on EGD once CHF symptoms improved.  Continue PPI BID.  Continue iron supplement.  Follow H&H in a.m.  Acute kidney injury ([place]) Suspect cardiorenal syndrome.  Monitor on Lasix drip.    Essential hypertension Stable.  Continue bisoprolol.    Type 2 diabetes mellitus with neuropathy Continue Novolin twice daily and sliding scale coverage.  Continue gabapentin    PAF (paroxysmal atrial fibrillation) (HCC) Rate controlled.  Xarelto on hold due to severe iron deficiency anemia.  NSVT Continue potassium supplement.  Magnesium normal.    COPD (chronic obstructive pulmonary disease) (HCC) Not in acute exacerbation.  Wheezy likely due to CHF.  Continue 3 L O2 via nasal cannula and scheduled nebs.  Continue Dulera.  Smokes 3-4 cigarettes a day.  Add nicotine patch and counseled strongly on cessation.  Dyslipidemia Continue statin.  Blood culture x1 on admission growing staph species which is a contaminant.  Antibiotics have been discontinued.  Code Status : Full code  Family Communication  : None at bedside  Disposition Plan  : Home pending clinical improvement possibly in the next 3 days  Barriers For Discharge : Active symptoms  Consults  : Heart failure,  lebeuar GI  Procedures  : None  DVT Prophylaxis  : None (severe anemia)  Lab Results  Component Value Date   PLT 549 (H) 03/08/2019    Antibiotics  : None  Anti-infectives (From admission, onward)   Start     Dose/Rate Route Frequency Ordered Stop   03/06/19 1945  ceFEPIme (MAXIPIME) 2 g in sodium chloride  0.9 % 100 mL IVPB     2 g 200 mL/hr over 30 Minutes Intravenous  Once 03/06/19 1941 03/06/19 2046   03/06/19 1945  vancomycin (VANCOCIN) 2,000 mg in sodium chloride 0.9 % 500 mL IVPB     2,000 mg 250 mL/hr over 120 Minutes Intravenous  Once 03/06/19 1941 03/07/19 0015        Objective:   Vitals:   03/08/19 0459 03/08/19 0803 03/08/19 0806 03/08/19 0824  BP: 107/60   (!) 106/59  Pulse: 83 82  82  Resp: _0 Temp:      TempSrc:      SpO2: 95% 96% 96% 97%  Weight:      Height:        Wt Readings from Last 3 Encounters:  03/08/19 125.3 kg  01/24/19 115.8 kg  01/14/19 116.6 kg     Intake/Output Summary (Last 24 hours) at 03/08/2019 1047 Last data filed at 03/08/2019 0510 Gross per 24 hour  Intake 1180 ml  Output 1600 ml  Net -420 ml     Physical Exam  Gen:, Elderly male appears fatigued, not in acute distress HEENT: Pallor present, moist mucosa, JVD +, supple neck Chest: Diffuse bilateral wheezing, no crackles CVS: S1 and S2 regular, no murmurs rub or gallop GI: Soft, nondistended, nontender, bowel sounds present Musculoskeletal: 2+ pitting edema bilaterally    Data Review:    CBC Recent Labs  Lab 03/06/19 1829 03/06/19 1839 03/06/19 2020 03/07/19 0524 03/07/19 1842 03/08/19 0412  WBC 10.3  --   --  10.4 8.8 9.0  HGB 6.7* 8.5* 6.7* 7.0* 7.5* 6.9*  HCT 24.7* 25.0* 24.2* 24.3* 26.0* 23.3*  PLT 625*  --   --  582* 603* 549*  MCV 86.7  --   --  83.2 83.3 82.3  MCH 23.5*  --   --  24.0* 24.0* 24.4*  MCHC 27.1*  --   --  28.8* 28.8* 29.6*  RDW 22.6*  --   --  21.6* 21.8* 21.6*  LYMPHSABS 0.8  --   --   --  1.2  --   MONOABS 1.2*  --   --   --  1.1*  --   EOSABS 0.1  --   --   --  0.3  --   BASOSABS 0.1  --   --   --  0.1  --     Chemistries  Recent Labs  Lab 03/06/19 1829 03/06/19 1839 03/07/19 0524 03/08/19 0412  NA 136 138 141 140  K 4.7 4.7 4.5 3.7  CL 95*  --  99 92*  CO2 29  --  35* 35*  GLUCOSE 97  --  113* 114*  BUN 18  --  19 27*   CREATININE 1.12  --  1.19 1.62*  CALCIUM 8.6*  --  8.3* 8.3*  MG  --   --   --  2.3  AST 16  --   --   --   ALT 12  --   --   --   ALKPHOS 120  --   --   --   BILITOT 0.4  --   --   --    ------------------------------------------------------------------------------------------------------------------ Recent Labs    03/08/19 0412  CHOL 83  HDL 43  LDLCALC 18  TRIG 109  CHOLHDL 1.9    Lab Results  Component Value Date   HGBA1C 6.5 (H) 11/13/2018   ------------------------------------------------------------------------------------------------------------------ No results for input(s): TSH, T4TOTAL, T3FREE, THYROIDAB in the last 72 hours.  Invalid input(s): FREET3 ------------------------------------------------------------------------------------------------------------------ Recent Labs    03/06/19 2103  VITAMINB12 234  FOLATE 13.0  FERRITIN 8*  TIBC 437  IRON 12*  RETICCTPCT 2.8    Coagulation profile No results for input(s): INR, PROTIME in the last 168 hours.  No results for input(s): DDIMER in the last 72 hours.  Cardiac Enzymes Recent Labs  Lab 03/06/19 1829 03/06/19 2103  TROPONINI 0.05* 0.04*   ------------------------------------------------------------------------------------------------------------------    Component Value Date/Time   BNP 1,020.1 (H) 03/06/2019 1829   BNP 191.4 (H) 03/24/2016 1656    Inpatient Medications  Scheduled Meds: . sodium chloride   Intravenous Once  . sodium chloride   Intravenous Once  . bisoprolol  2.5 mg Oral Daily  . docusate sodium  100 mg Oral Daily  . ferrous sulfate  325 mg Oral BID WC  . folic acid  1 mg Oral Daily  . gabapentin  600 mg Oral BID  . insulin aspart  0-15 Units Subcutaneous TID WC  . insulin NPH Human  10 Units Subcutaneous BID AC & HS  . ipratropium-albuterol  3 mL Nebulization BID  . isosorbide-hydrALAZINE  1 tablet Oral TID  . latanoprost  1 drop Both Eyes QHS  . mouth rinse  15 mL  Mouth Rinse BID  . metolazone  2.5 mg Oral Once  . mometasone-formoterol  2 puff Inhalation BID  . pantoprazole  40 mg Oral BID  . polyethylene glycol  17 g Oral Daily  . potassium chloride  20 mEq Oral BID  . rosuvastatin  20 mg Oral q1800  . sodium chloride flush  3 mL Intravenous Q12H   Continuous Infusions: . sodium chloride    . furosemide (LASIX) infusion     PRN Meds:.sodium chloride, acetaminophen, fluticasone, guaiFENesin, ondansetron (ZOFRAN) IV, oxyCODONE-acetaminophen, sodium chloride flush  Micro Results Recent Results (from the past 240 hour(s))  SARS Coronavirus 2 (CEPHEID-  Performed in [facility] hospital lab), Hosp Order     Status: None   Collection Time: 03/06/19  6:29 PM  Result Value Ref Range Status   SARS Coronavirus 2 NEGATIVE NEGATIVE Final    Comment: (NOTE) If result is NEGATIVE SARS-CoV-2 target nucleic acids are NOT DETECTED. The SARS-CoV-2 RNA is generally detectable in upper and lower  respiratory specimens during the acute phase of infection. The lowest  concentration of SARS-CoV-2 viral copies this assay can detect is 250  copies / mL. A negative result does not preclude SARS-CoV-2 infection  and should not be used as the sole basis for treatment or other  patient management decisions.  A negative result may occur with  improper specimen collection / handling, submission of specimen other  than nasopharyngeal swab, presence of viral mutation(s) within the  areas targeted by this assay, and inadequate number of viral copies  (<250 copies / mL). A negative result must be combined with clinical  observations, patient history, and epidemiological information. If result is POSITIVE SARS-CoV-2 target nucleic acids are DETECTED. The SARS-CoV-2 RNA is generally detectable in upper and lower  respiratory specimens dur ing the acute phase of infection.  Positive  results are indicative of active infection with SARS-CoV-2.  Clinical  correlation with  patient history and other diagnostic information is  necessary to determine patient infection status.  Positive results do  not rule out bacterial infection or co-infection with other viruses. If result is PRESUMPTIVE POSTIVE SARS-CoV-2 nucleic acids MAY BE PRESENT.   A presumptive positive result was obtained on the submitted specimen  and confirmed on repeat testing.  While 2019 novel coronavirus  (SARS-CoV-2) nucleic acids may be present in the submitted sample  additional confirmatory testing may be necessary for epidemiological  and / or clinical management purposes  to differentiate between  SARS-CoV-2 and other Sarbecovirus currently known to infect humans.  If clinically indicated additional testing with an alternate test  methodology [phone]) is advised. The SARS-CoV-2 RNA is generally  detectable in upper and lower respiratory sp ecimens during the acute  phase of infection. The expected result is Negative. Fact Sheet for Patients:  StrictlyIdeas.no Fact Sheet for Healthcare Providers: BankingDealers.co.za This test is not yet approved or cleared by the [place] FDA and has been authorized for detection and/or diagnosis of SARS-CoV-2 by FDA under an Emergency Use Authorization (EUA).  This EUA will remain in effect (meaning this test can be used) for the duration of the COVID-19 declaration under Section 564(b)(1) of the Act, 21 U.S.C. section 360bbb-3(b)(1), unless the authorization is terminated or revoked sooner. Performed at [facility], [place] [address], [place], [place] [address]   Urine culture     Status: Abnormal   Collection Time: 03/06/19  6:29 PM  Result Value Ref Range Status   Specimen Description URINE, RANDOM  Final   Special Requests NONE  Final   Culture (A)  Final    <10,000 COLONIES/mL INSIGNIFICANT GROWTH Performed at [facility], [place] [address], [place], [place] [address]    Report  Status 03/07/2019 FINAL  Final  Blood culture (routine x 2)     Status: Abnormal (Preliminary result)   Collection Time: 03/06/19  6:54 PM  Result Value Ref Range Status   Specimen Description BLOOD RIGHT ANTECUBITAL  Final   Special Requests   Final    BOTTLES DRAWN AEROBIC AND ANAEROBIC Blood Culture adequate volume   Culture  Setup Time   Final  GRAM POSITIVE COCCI IN BOTH AEROBIC AND ANAEROBIC BOTTLES CRITICAL RESULT CALLED TO, READ BACK BY AND VERIFIED WITH: PHARMD T DANG [id number] MLM    Culture (A)  Final    STAPHYLOCOCCUS SPECIES (COAGULASE NEGATIVE) THE SIGNIFICANCE OF ISOLATING THIS ORGANISM FROM A SINGLE SET OF BLOOD CULTURES WHEN MULTIPLE SETS ARE DRAWN IS UNCERTAIN. PLEASE NOTIFY THE MICROBIOLOGY DEPARTMENT WITHIN ONE WEEK IF SPECIATION AND SENSITIVITIES ARE REQUIRED. Performed at [facility], [place] [address], [place], [place] [address]    Report Status PENDING  Incomplete  Blood Culture ID Panel (Reflexed)     Status: Abnormal   Collection Time: 03/06/19  6:54 PM  Result Value Ref Range Status   Enterococcus species NOT DETECTED NOT DETECTED Final   Listeria monocytogenes NOT DETECTED NOT DETECTED Final   Staphylococcus species DETECTED (A) NOT DETECTED Final    Comment: Methicillin (oxacillin) resistant coagulase negative staphylococcus. Possible blood culture contaminant (unless isolated from more than one blood culture draw or clinical case suggests pathogenicity). No antibiotic treatment is indicated for blood  culture contaminants. CRITICAL RESULT CALLED TO, READ BACK BY AND VERIFIED WITH: PHARMD T DANG [id number] MLM    Staphylococcus aureus (BCID) NOT DETECTED NOT DETECTED Final   Methicillin resistance DETECTED (A) NOT DETECTED Final    Comment: CRITICAL RESULT CALLED TO, READ BACK BY AND VERIFIED WITH: PHARMD T DANG [id number] MLM    Streptococcus species NOT DETECTED NOT DETECTED Final   Streptococcus agalactiae NOT DETECTED NOT DETECTED Final    Streptococcus pneumoniae NOT DETECTED NOT DETECTED Final   Streptococcus pyogenes NOT DETECTED NOT DETECTED Final   Acinetobacter baumannii NOT DETECTED NOT DETECTED Final   Enterobacteriaceae species NOT DETECTED NOT DETECTED Final   Enterobacter cloacae complex NOT DETECTED NOT DETECTED Final   Escherichia coli NOT DETECTED NOT DETECTED Final   Klebsiella oxytoca NOT DETECTED NOT DETECTED Final   Klebsiella pneumoniae NOT DETECTED NOT DETECTED Final   Proteus species NOT DETECTED NOT DETECTED Final   Serratia marcescens NOT DETECTED NOT DETECTED Final   Haemophilus influenzae NOT DETECTED NOT DETECTED Final   Neisseria meningitidis NOT DETECTED NOT DETECTED Final   Pseudomonas aeruginosa NOT DETECTED NOT DETECTED Final   Candida albicans NOT DETECTED NOT DETECTED Final   Candida glabrata NOT DETECTED NOT DETECTED Final   Candida krusei NOT DETECTED NOT DETECTED Final   Candida parapsilosis NOT DETECTED NOT DETECTED Final   Candida tropicalis NOT DETECTED NOT DETECTED Final    Comment: Performed at [facility], [address], [place], [place] [address]  Blood culture (routine x 2)     Status: None (Preliminary result)   Collection Time: 03/06/19  6:59 PM  Result Value Ref Range Status   Specimen Description BLOOD LEFT HAND  Final   Special Requests   Final    BOTTLES DRAWN AEROBIC AND ANAEROBIC Blood Culture results may not be optimal due to an inadequate volume of blood received in culture bottles   Culture   Final    NO GROWTH 2 DAYS Performed at [facility], [place] [address], [place], [place] [address]    Report Status PENDING  Incomplete  Culture, blood (routine x 2)     Status: None (Preliminary result)   Collection Time: 03/07/19  6:42 PM  Result Value Ref Range Status   Specimen Description BLOOD RIGHT HAND  Final   Special Requests   Final    BOTTLES DRAWN AEROBIC AND ANAEROBIC Blood Culture adequate  volume   Culture   Final    NO GROWTH < 12 HOURS  Performed at [facility], [place] [address] [place], [place] [address]    Report Status PENDING  Incomplete  Culture, blood (routine x 2)     Status: None (Preliminary result)   Collection Time: 03/07/19  6:42 PM  Result Value Ref Range Status   Specimen Description BLOOD LEFT HAND  Final   Special Requests   Final    BOTTLES DRAWN AEROBIC ONLY Blood Culture results may not be optimal due to an inadequate volume of blood received in culture bottles   Culture   Final    NO GROWTH < 12 HOURS Performed at [facility], [place] [address] [place], [place] [address]    Report Status PENDING  Incomplete    Radiology Reports Dg Chest 2 View  Result Date: 03/06/2019 CLINICAL DATA:  Shortness of breath EXAM: CHEST - 2 VIEW COMPARISON:  01/11/2011 FINDINGS: Bilateral interstitial and alveolar airspace opacities. Small left pleural effusion. No pneumothorax. Stable cardiomegaly. No acute osseous abnormality. IMPRESSION: Bilateral interstitial and alveolar airspace opacities with stable cardiomegaly. Differential considerations include pulmonary edema versus multilobar pneumonia. Electronically Signed   By: Kathreen Devoid   On: 03/06/2019 19:07    Time Spent in minutes 35  Wilkin Lippy M.D on 03/08/2019 at 10:47 AM  Between 7am to 7pm - Pager - [phone]  After 7pm go to [email or web address] - password [facility]  [facility] -  Office  [phone]

## 2019-03-08 NOTE — Progress Notes (Signed)
Patient is asleep with no distress on Cpap, Called out to check his 02 flow and the o2 tubing was off the machine sats at 92% reconnected sats at 96 to 100%

## 2019-03-08 NOTE — Progress Notes (Addendum)
Patient ID: Nicholas Caldwell, male   DOB: 1946/06/18, 73 y.o.   MRN: [id number]     Advanced Heart Failure Rounding Note  PCP-Cardiologist: No primary care provider on file.   Subjective:    Patient feels weak, refuses to get out of bed for PT or to weigh.  Hgb back down to 6.9 this morning, had 1 unit PRBCs yesterday.   Afebrile.  He had 1 blood culture positive for suspected coagulase negative Staph.    Objective:   Weight Range: 125.3 kg Body mass index is 37.46 kg/m.   Vital Signs:   Temp:  [97.9 F (36.6 C)-98.8 F (37.1 C)] 98.7 F (37.1 C) (05/05 0442) Pulse Rate:  [81-84] 82 (05/05 0824) Resp:  [16-24] 20 (05/05 0824) BP: (99-115)/(59-66) 106/59 (05/05 0824) SpO2:  [92 %-100 %] 97 % (05/05 0824) FiO2 (%):  [32 %] 32 % (05/05 0806) Weight:  [125.3 kg] 125.3 kg (05/05 0207) Last BM Date: 03/06/19  Weight change: Filed Weights   03/07/19 0012 03/07/19 0121 03/08/19 0207  Weight: 125 kg 123.4 kg 125.3 kg    Intake/Output:   Intake/Output Summary (Last 24 hours) at 03/08/2019 0938 Last data filed at 03/08/2019 0510 Gross per 24 hour  Intake 1180 ml  Output 2000 ml  Net -820 ml      Physical Exam    General:  Lethargic HEENT: Normal Neck: Supple. JVP 16+ cm. Carotids 2+ bilat; no bruits. No lymphadenopathy or thyromegaly appreciated. Cor: PMI nonpalpable. Regular rate & rhythm. No rubs, gallops or murmurs. Lungs: Wheezing bilaterally.  Abdomen: Soft, nontender, mildly distended. No hepatosplenomegaly. No bruits or masses. Good bowel sounds. Extremities: No cyanosis, clubbing, rash. 1+ edema to knees.  Neuro: Drowsy but orientedx3, cranial nerves grossly intact. moves all 4 extremities w/o difficulty. Affect pleasant   Telemetry   Atrial flutter rate in 80s (personally reviewed).   Labs    CBC Recent Labs    03/06/19 1829  03/07/19 1842 03/08/19 0412  WBC 10.3   < > 8.8 9.0  NEUTROABS 8.1*  --  6.1  --   HGB 6.7*   < > 7.5* 6.9*  HCT 24.7*   < >  26.0* 23.3*  MCV 86.7   < > 83.3 82.3  PLT 625*   < > 603* 549*   < > = values in this interval not displayed.   Basic Metabolic Panel Recent Labs    03/07/19 0524 03/08/19 0412  NA 141 140  K 4.5 3.7  CL 99 92*  CO2 35* 35*  GLUCOSE 113* 114*  BUN 19 27*  CREATININE 1.19 1.62*  CALCIUM 8.3* 8.3*  MG  --  2.3   Liver Function Tests Recent Labs    03/06/19 1829  AST 16  ALT 12  ALKPHOS 120  BILITOT 0.4  PROT 7.4  ALBUMIN 3.2*   No results for input(s): LIPASE, AMYLASE in the last 72 hours. Cardiac Enzymes Recent Labs    03/06/19 1829 03/06/19 2103  TROPONINI 0.05* 0.04*    BNP: BNP (last 3 results) Recent Labs    12/01/18 0015 12/30/18 1621 03/06/19 1829  BNP 1,666.7* 1,273.0* 1,020.1*    ProBNP (last 3 results) No results for input(s): PROBNP in the last 8760 hours.   D-Dimer No results for input(s): DDIMER in the last 72 hours. Hemoglobin A1C No results for input(s): HGBA1C in the last 72 hours. Fasting Lipid Panel Recent Labs    03/08/19 0412  CHOL 83  HDL 43  LDLCALC  18  TRIG 109  CHOLHDL 1.9   Thyroid Function Tests No results for input(s): TSH, T4TOTAL, T3FREE, THYROIDAB in the last 72 hours.  Invalid input(s): FREET3  Other results:   Imaging     No results found.   Medications:     Scheduled Medications: . sodium chloride   Intravenous Once  . sodium chloride   Intravenous Once  . bisoprolol  2.5 mg Oral Daily  . docusate sodium  100 mg Oral Daily  . ferrous sulfate  325 mg Oral BID WC  . folic acid  1 mg Oral Daily  . furosemide  80 mg Intravenous BID  . gabapentin  600 mg Oral BID  . insulin aspart  0-15 Units Subcutaneous TID WC  . insulin NPH Human  10 Units Subcutaneous BID AC & HS  . ipratropium-albuterol  3 mL Nebulization BID  . isosorbide-hydrALAZINE  1 tablet Oral TID  . latanoprost  1 drop Both Eyes QHS  . mouth rinse  15 mL Mouth Rinse BID  . mometasone-formoterol  2 puff Inhalation BID  .  pantoprazole  40 mg Oral BID  . polyethylene glycol  17 g Oral Daily  . potassium chloride  20 mEq Oral BID  . rosuvastatin  20 mg Oral q1800  . sodium chloride flush  3 mL Intravenous Q12H     Infusions: . sodium chloride       PRN Medications:  sodium chloride, acetaminophen, fluticasone, guaiFENesin, ondansetron (ZOFRAN) IV, oxyCODONE-acetaminophen, sodium chloride flush   Assessment/Plan   1. Acute on chronic systolic CHF: Nonischemic cardiomyopathy.  With prominent RV failure, likely related to COPD/OSA/OHS. TEE in 3/20 with EF 40-45%, severely dilated RV with moderately decreased systolic function. He has had multiple recent admissions with CHF and COPD exacerbations.  He has not cooperated with paramedicine or [facility] home health as outpatient, has not been using CPAP per his wife, suspect significant medication noncompliance as well though he denies.  He remains markedly volume overloaded on exam today, did not diurese particularly well yesterday.  Creatinine up to 1.6.  - He has had Lasix 80 mg IV x 1 today.  Will start Lasix gtt at 12 mg/hr and will give a dose of metolazone again.   - Continue bisoprolol 2.5 daily.  - Continue Bidil 1 tab tid.  - If creatinine stabilizes, add spironolactone, ARB versus Entresto if he has BP room.  - May need PICC versus RHC if creatinine continues to rise and diuresis is difficult.  - Cardiomems may be helpful as outpatient.  2. Anemia: Baseline hgb around 8.  6.7 at admission, denies overt GI bleeding.  He has had 1 unit PRBCs and IV Fe.  Hgb back down to 6.9.  - 1 unit PRBCs again today.   - Hold Xarelto for now.  - GI has seen, will need eventual scope when volume status improved.  Ideally, would have him on Xarelto to allow TEE-guided DCCV to get him out of atrial flutter but need GI workup first.  3. Atrial fibrillation/flutter: Patient has history of paroxysmal atrial fibrillation.  This admission, he appears to be in an atypical  atrial flutter.  Rate is currently controlled. Xarelto on hold with GI bleeding.  Was seen in past by Dr. Rayann Heman, not thought to be good ablation candidate.  He has not been on Tikosyn or amiodarone due to history of long QT.  - Needs GI workup for source of anemia.  When he is back on Xarelto, would plan  TEE-guided DCCV.  - Will eventually ask EP to reconsider ablation given lack of good anti-arrhythmic options. He seems to do better in NSR.  4. COPD: Severe, on 3L home oxygen.  He has diffuse wheezing on exam.  This may be due to pulmonary edema, but also consider component of COPD exacerbation.  - Getting Duonebs.  - Not on steroids.  5. OHS/OSA: On home oxygen.  Per wife, has not been using CPAP at home.  Will give CPAP at night here.  6. ID: Suspect this is primarily a CHF exacerbation.  Afebrile and WBCs not significantly elevated.  1 blood culture positive for what appears to be coagulase negative Staph (probably contaminant).  COVID-19 negative.  7. CAD: Nonobstructive on prior cath.  He is on Crestor.    Length of Stay: 2  Loralie Champagne, MD  03/08/2019, 9:38 AM  Advanced Heart Failure Team Pager [phone] (M-F; 7a - 4p)  Please contact [facility] for night-coverage after hours (4p -7a ) and weekends on [email or web address]

## 2019-03-08 NOTE — Progress Notes (Signed)
Progress Note    ASSESSMENT AND PLAN:   81. 73 -year-old male with multiple medical problems and multiple recent admissions for acute on chronic systolic heart failure/acute on chronic respiratory failure.  He is readmitted now with the same. CXR suggests pulmonary edema vr multilobar PNA. Undergoing diuresis, renal function worse today.  Lasix gtt started today  2. IDA on Xarelto. He has chronic anemia with recent blood transfusion requirements. Now recurrent decline in hgb from baseline of 8-9 down to 6.7 in absence of GI blood loss.  -Getting a second [place] right now since no significant improvement after one unit yesterday  -He needs endoscopic work-up when stable from cardiopulmonary standpoint especially since Cardiology wants him ON Xarelto for TEE guided DCCV to get him out of [place].   3. AKI. Cr 1.19 >>> 1.62.    SUBJECTIVE    sleepy. No complaints. Blood is transfusing   OBJECTIVE:     Vital signs in last 24 hours: Temp:  [98 F (36.7 C)-98.8 F (37.1 C)] 98 F (36.7 C) (05/05 1210) Pulse Rate:  [65-83] 82 (05/05 1210) Resp:  [16-24] 18 (05/05 1210) BP: (98-115)/(57-73) 114/73 (05/05 1210) SpO2:  [92 %-100 %] 97 % (05/05 1210) FiO2 (%):  [32 %] 32 % (05/05 0806) Weight:  [125.3 kg] 125.3 kg (05/05 0207) Last BM Date: 03/07/19 General:   Sleeping in bedside chair Heart:  Regular rate and rhythm. Pitting edema of BLE   Pulm: Normal respiratory effort Abdomen:  Soft, protuberant.  Normal bowel sounds,.        Intake/Output from previous day: 05/04 0701 - 05/05 0700 In: 1680 [P.O.:1580; IV Piggyback:100] Out: 2300 [Urine:2300] Intake/Output this shift: Total I/O In: -  Out: 750 [Urine:750]  Lab Results: Recent Labs    03/07/19 0524 03/07/19 1842 03/08/19 0412  WBC 10.4 8.8 9.0  HGB 7.0* 7.5* 6.9*  HCT 24.3* 26.0* 23.3*  PLT 582* 603* 549*   BMET Recent Labs    03/06/19 1829 03/06/19 1839 03/07/19 0524 03/08/19 0412  NA 136 138 141  140  K 4.7 4.7 4.5 3.7  CL 95*  --  99 92*  CO2 29  --  35* 35*  GLUCOSE 97  --  113* 114*  BUN 18  --  19 27*  CREATININE 1.12  --  1.19 1.62*  CALCIUM 8.6*  --  8.3* 8.3*   LFT Recent Labs    03/06/19 1829  PROT 7.4  ALBUMIN 3.2*  AST 16  ALT 12  ALKPHOS 120  BILITOT 0.4   PT/INR No results for input(s): LABPROT, INR in the last 72 hours. Hepatitis Panel No results for input(s): HEPBSAG, HCVAB, HEPAIGM, HEPBIGM in the last 72 hours.  Dg Chest 2 View  Result Date: 03/06/2019 CLINICAL DATA:  Shortness of breath EXAM: CHEST - 2 VIEW COMPARISON:  01/11/2011 FINDINGS: Bilateral interstitial and alveolar airspace opacities. Small left pleural effusion. No pneumothorax. Stable cardiomegaly. No acute osseous abnormality. IMPRESSION: Bilateral interstitial and alveolar airspace opacities with stable cardiomegaly. Differential considerations include pulmonary edema versus multilobar pneumonia. Electronically Signed   By: Kathreen Devoid   On: 03/06/2019 19:07      Principal Problem:   Acute on chronic respiratory failure with hypoxia and hypercapnia (HCC) Active Problems:   Essential hypertension   Type 2 diabetes mellitus with neuropathy   PAF (paroxysmal atrial fibrillation) (HCC)   COPD (chronic obstructive pulmonary disease) ([place])   Medically noncompliant   Atrial fibrillation with RVR ([place])  AKI (acute kidney injury) ([place])   Acute on chronic anemia   Acute on chronic systolic (congestive) heart failure (HCC)   Acute on chronic systolic CHF (congestive heart failure) (HCC)   Iron deficiency anemia   NSVT (nonsustained ventricular tachycardia) ([place])   Tobacco abuse counseling     LOS: 2 days   Tye Savoy ,NP 03/08/2019, 12:20 PM

## 2019-03-08 NOTE — Plan of Care (Signed)
  Problem: Clinical Measurements: Goal: Ability to maintain clinical measurements within normal limits will improve Outcome: Progressing Goal: Will remain free from infection Outcome: Progressing Note:  No s/s of infection Goal: Diagnostic test results will improve Outcome: Not Progressing Note:  Hgb- decreased 6.9   Problem: Clinical Measurements: Goal: Diagnostic test results will improve Outcome: Not Progressing Note:  Hgb- decreased 6.9

## 2019-03-08 NOTE — Progress Notes (Signed)
RT Note:  Was called to patient room by RN, [place] RN was just walking into room.  Listened to patient, wheezing throughout all lung fields, which was also present on 03/07/19, but now patient BS indicate possible extra fluid that was not there last evening.  RN has ordered a cxray.  Waiting for results.

## 2019-03-08 NOTE — Progress Notes (Signed)
PT Cancellation Note  Patient Details Name: Nicholas Caldwell MRN: [id number] DOB: 12/19/1945   Cancelled Treatment:    Reason Eval/Treat Not Completed: Patient declined, no reason specified(pt reports fatigue and weakness and unwiling to mobilize despite encouragement and education)   Lamarr Lulas 03/08/2019, 7:34 AM  Elwyn Reach, PT Acute Rehabilitation Services Pager: [phone] Office: [phone]

## 2019-03-09 ENCOUNTER — Inpatient Hospital Stay: Payer: Self-pay

## 2019-03-09 LAB — CBC WITH DIFFERENTIAL/PLATELET
Abs Immature Granulocytes: 0.09 10*3/uL — ABNORMAL HIGH (ref 0.00–0.07)
Basophils Absolute: 0 10*3/uL (ref 0.0–0.1)
Basophils Relative: 0 %
Eosinophils Absolute: 0.1 10*3/uL (ref 0.0–0.5)
Eosinophils Relative: 1 %
HCT: 28 % — ABNORMAL LOW (ref 39.0–52.0)
Hemoglobin: 8 g/dL — ABNORMAL LOW (ref 13.0–17.0)
Immature Granulocytes: 1 %
Lymphocytes Relative: 9 %
Lymphs Abs: 0.9 10*3/uL (ref 0.7–4.0)
MCH: 24.1 pg — ABNORMAL LOW (ref 26.0–34.0)
MCHC: 28.6 g/dL — ABNORMAL LOW (ref 30.0–36.0)
MCV: 84.3 fL (ref 80.0–100.0)
Monocytes Absolute: 1.4 10*3/uL — ABNORMAL HIGH (ref 0.1–1.0)
Monocytes Relative: 13 %
Neutro Abs: 8 10*3/uL — ABNORMAL HIGH (ref 1.7–7.7)
Neutrophils Relative %: 76 %
Platelets: 548 10*3/uL — ABNORMAL HIGH (ref 150–400)
RBC: 3.32 MIL/uL — ABNORMAL LOW (ref 4.22–5.81)
RDW: 21.2 % — ABNORMAL HIGH (ref 11.5–15.5)
WBC: 10.5 10*3/uL (ref 4.0–10.5)
nRBC: 6.6 % — ABNORMAL HIGH (ref 0.0–0.2)

## 2019-03-09 LAB — GLUCOSE, CAPILLARY
Glucose-Capillary: 113 mg/dL — ABNORMAL HIGH (ref 70–99)
Glucose-Capillary: 182 mg/dL — ABNORMAL HIGH (ref 70–99)
Glucose-Capillary: 230 mg/dL — ABNORMAL HIGH (ref 70–99)
Glucose-Capillary: 342 mg/dL — ABNORMAL HIGH (ref 70–99)

## 2019-03-09 LAB — CULTURE, BLOOD (ROUTINE X 2): Special Requests: ADEQUATE

## 2019-03-09 LAB — TYPE AND SCREEN
ABO/RH(D): B POS
Antibody Screen: NEGATIVE
Unit division: 0
Unit division: 0

## 2019-03-09 LAB — BPAM RBC
Blood Product Expiration Date: 202005092359
Blood Product Expiration Date: 202005172359
ISSUE DATE / TIME: 202005040034
ISSUE DATE / TIME: 202005051140
Unit Type and Rh: 7300
Unit Type and Rh: 7300

## 2019-03-09 LAB — BASIC METABOLIC PANEL
Anion gap: 10 (ref 5–15)
BUN: 28 mg/dL — ABNORMAL HIGH (ref 8–23)
CO2: 39 mmol/L — ABNORMAL HIGH (ref 22–32)
Calcium: 8.2 mg/dL — ABNORMAL LOW (ref 8.9–10.3)
Chloride: 91 mmol/L — ABNORMAL LOW (ref 98–111)
Creatinine, Ser: 1.71 mg/dL — ABNORMAL HIGH (ref 0.61–1.24)
GFR calc Af Amer: 46 mL/min — ABNORMAL LOW (ref >60)
GFR calc non Af Amer: 40 mL/min — ABNORMAL LOW (ref >60)
Glucose, Bld: 121 mg/dL — ABNORMAL HIGH (ref 70–99)
Potassium: 2.8 mmol/L — ABNORMAL LOW (ref 3.5–5.1)
Sodium: 140 mmol/L (ref 135–145)

## 2019-03-09 LAB — COOXEMETRY PANEL
Carboxyhemoglobin: 1.7 % — ABNORMAL HIGH (ref 0.5–1.5)
Methemoglobin: 1.9 % — ABNORMAL HIGH (ref 0.0–1.5)
O2 Saturation: 75.6 %
Total hemoglobin: 9.5 g/dL — ABNORMAL LOW (ref 12.0–16.0)

## 2019-03-09 LAB — PATHOLOGIST SMEAR REVIEW

## 2019-03-09 MED ORDER — METOLAZONE 5 MG PO TABS
5.0000 mg | ORAL_TABLET | Freq: Once | ORAL | Status: AC
  Administered 2019-03-09: 5 mg via ORAL
  Filled 2019-03-09: qty 1

## 2019-03-09 MED ORDER — IPRATROPIUM-ALBUTEROL 0.5-2.5 (3) MG/3ML IN SOLN: 3.0000 mL | Freq: Three times a day (TID) | RESPIRATORY_TRACT | Status: DC

## 2019-03-09 MED ORDER — BUDESONIDE 0.25 MG/2ML IN SUSP
0.2500 mg | Freq: Two times a day (BID) | RESPIRATORY_TRACT | Status: DC
  Administered 2019-03-09 – 2019-03-12 (×6): 0.25 mg via RESPIRATORY_TRACT
  Filled 2019-03-09 (×6): qty 2

## 2019-03-09 MED ORDER — POTASSIUM CHLORIDE CRYS ER 20 MEQ PO TBCR
40.0000 meq | EXTENDED_RELEASE_TABLET | Freq: Once | ORAL | Status: AC
  Administered 2019-03-09: 40 meq via ORAL
  Filled 2019-03-09: qty 2

## 2019-03-09 MED ORDER — ARFORMOTEROL TARTRATE 15 MCG/2ML IN NEBU
15.0000 ug | INHALATION_SOLUTION | Freq: Two times a day (BID) | RESPIRATORY_TRACT | Status: DC
  Administered 2019-03-09 – 2019-03-31 (×44): 15 ug via RESPIRATORY_TRACT
  Filled 2019-03-09 (×46): qty 2

## 2019-03-09 MED ORDER — SPIRONOLACTONE 12.5 MG HALF TABLET
12.5000 mg | ORAL_TABLET | Freq: Every day | ORAL | Status: DC
  Administered 2019-03-09: 12.5 mg via ORAL
  Filled 2019-03-09: qty 1

## 2019-03-09 MED ORDER — SODIUM CHLORIDE 0.9% FLUSH
10.0000 mL | Freq: Two times a day (BID) | INTRAVENOUS | Status: DC
  Administered 2019-03-09 – 2019-03-25 (×19): 10 mL

## 2019-03-09 MED ORDER — SODIUM CHLORIDE 0.9% FLUSH: 10.0000 mL | INTRAVENOUS | Status: DC | PRN

## 2019-03-09 MED ORDER — IPRATROPIUM-ALBUTEROL 0.5-2.5 (3) MG/3ML IN SOLN
3.0000 mL | Freq: Three times a day (TID) | RESPIRATORY_TRACT | Status: DC
  Administered 2019-03-09 – 2019-03-31 (×66): 3 mL via RESPIRATORY_TRACT
  Filled 2019-03-09 (×66): qty 3

## 2019-03-09 MED ORDER — METHYLPREDNISOLONE SODIUM SUCC 125 MG IJ SOLR
60.0000 mg | Freq: Two times a day (BID) | INTRAMUSCULAR | Status: DC
  Administered 2019-03-09 – 2019-03-10 (×2): 60 mg via INTRAVENOUS
  Filled 2019-03-09 (×2): qty 2

## 2019-03-09 NOTE — Progress Notes (Signed)
PT Cancellation Note  Patient Details Name: Nicholas Caldwell MRN: [id number] DOB: [birth year]   Cancelled Treatment:    Reason Eval/Treat Not Completed: Patient at procedure or test/unavailable   Will continue attempting for PT evaluation;   Per departmental guidelines, if Nicholas Caldwell declines PT 3x, will DC PT until he is able to participate;   Roney Marion, [place] Pager [phone] Office [place] 03/09/2019, 10:58 AM

## 2019-03-09 NOTE — Progress Notes (Signed)
Third attempt for report to [facility].

## 2019-03-09 NOTE — Progress Notes (Signed)
[facility]                                                                              Patient Demographics  Nicholas Caldwell, is a 73 y.o. male, DOB - [birth year], RVI:[id number]  Admit date - 03/06/2019   Admitting Physician Etta Quill, DO  Outpatient Primary MD for the patient is [facility]  Outpatient specialists:    LOS - 3  days   Medical records reviewed and are as summarized below:    Chief Complaint  Patient presents with  . Shortness of Breath       Brief summary   73 year old male with history of combined systolic and diastolic CHF with EF of 61-47% (per TEE in March), paroxysmal A. fib on Xarelto, COPD on 3 L home O2, noncompliance with inhalers with ongoing tobacco use, possibly diuretic, morbid obesity with OSA not adherent to CPAP presented with acute on chronic hypoxic respiratory failure secondary to acute on chronic combined systolic and diastolic CHF.  Patient had gross anasarca with pulmonary edema on chest x-ray and BNP of thousand.  Required nebs in the ED, higher oxygen and IV Lasix.  Admitted for aggressive IV diuresis.  Also found to have severe iron deficiency anemia.   Assessment & Plan    Principal Problem:   Acute on chronic respiratory failure with hypoxia and hypercapnia (HCC) -Likely secondary to acute on chronic combined systolic and diastolic CHF, COPD exacerbation -Currently on O2 4 L via nasal cannula, wean as tolerated -Management as below  Active Problems:  Acute on chronic combined systolic and diastolic CHF.  Underlying nonischemic cardiomyopathy,  RV failure secondary to COPD, OSA, OHS, medication noncompliance -Still volume overloaded, shortness of breath with wheezing -TEE in 3/20 with EF 40 to 45% with severely dilated RV and moderately decreased systolic function. -CHF team following closely, currently on Lasix drip, metolazone 5 mg p.o. x1 -Continue BiDil, bisoprolol, adding spironolactone, may  need milrinone -Strict I's and O's, negative balance of 4.2 L - weight on admission 265lbs->276-> today 269lbs   Acute COPD exacerbation -Diffusely wheezing, likely also has COPD exacerbation combined with CHF exacerbation  -Increased to scheduled duo nebs to 3 times daily, add Pulmicort, Brovana, - will place on steroids Solu-Medrol 60 mg every 12 hours, reassess and taper in a.m. -Flutter valve, continue PPI  Severe iron deficiency anemia -Baseline hemoglobin around 8-9, hemoglobin 6.9 on 5/5, received 1 unit packed RBCs and Feraheme -Hemoglobin 8.0 today -GI following, discussed with Dr. Bryan Lemma today, hold off on EGD until respiratory status improves, diffuse wheezing today -Continue PPI  Acute kidney injury -Creatinine trending up, 1.7 today, suspect cardiorenal syndrome, currently on Lasix drip  Essential hypertension Currently stable, continue bisoprolol, spironolactone  Type 2 diabetes mellitus with neuropathy -Continue Novolin twice daily and sliding scale insulin, expect CBGs to rise with addition of steroids  Hypokalemia -Currently on Lasix drip, aggressively replace  Paroxysmal atrial fibrillation Rate controlled, Xarelto currently on hold due to severe iron deficiency anemia   NSVT -Keep potassium above 4, aggressively replace, magnesium normal  Nicotine abuse Patient counseled on nicotine cessation, continue nicotine patch  Hyperlipidemia Continue statin   Code Status: Full code DVT Prophylaxis: SCD's, Xarelto on hold Family Communication: Discussed in detail with the patient, all imaging results, lab results explained to the patient    Disposition Plan: Currently acute hypoxic respiratory failure with diffuse wheezing, on Lasix drip, high risk of decompensation, will remain inpatient  Time Spent in minutes   35 minutes  Procedures:  None  Consultants:   [facility]  Antimicrobials:   Anti-infectives (From admission, onward)    Start     Dose/Rate Route Frequency Ordered Stop   03/06/19 1945  ceFEPIme (MAXIPIME) 2 g in sodium chloride 0.9 % 100 mL IVPB     2 g 200 mL/hr over 30 Minutes Intravenous  Once 03/06/19 1941 03/06/19 2046   03/06/19 1945  vancomycin (VANCOCIN) 2,000 mg in sodium chloride 0.9 % 500 mL IVPB     2,000 mg 250 mL/hr over 120 Minutes Intravenous  Once 03/06/19 1941 03/07/19 0015          Medications  Scheduled Meds: . sodium chloride   Intravenous Once  . arformoterol  15 mcg Nebulization BID  . bisoprolol  2.5 mg Oral Daily  . budesonide (PULMICORT) nebulizer solution  0.25 mg Nebulization BID  . docusate sodium  100 mg Oral Daily  . ferrous sulfate  325 mg Oral BID WC  . folic acid  1 mg Oral Daily  . gabapentin  600 mg Oral BID  . insulin aspart  0-15 Units Subcutaneous TID WC  . insulin NPH Human  10 Units Subcutaneous BID AC & HS  . ipratropium-albuterol  3 mL Nebulization TID  . isosorbide-hydrALAZINE  1 tablet Oral TID  . latanoprost  1 drop Both Eyes QHS  . mouth rinse  15 mL Mouth Rinse BID  . methylPREDNISolone (SOLU-MEDROL) injection  60 mg Intravenous Q12H  . nicotine  7 mg Transdermal Daily  . pantoprazole  40 mg Oral BID  . polyethylene glycol  17 g Oral Daily  . potassium chloride  20 mEq Oral BID  . rosuvastatin  20 mg Oral q1800  . sodium chloride flush  10-40 mL Intracatheter Q12H  . sodium chloride flush  3 mL Intravenous Q12H  . spironolactone  12.5 mg Oral Daily   Continuous Infusions: . sodium chloride    . furosemide (LASIX) infusion 12 mg/hr (03/08/19 1757)   PRN Meds:.sodium chloride, acetaminophen, fluticasone, guaiFENesin, ondansetron (ZOFRAN) IV, oxyCODONE-acetaminophen, sodium chloride flush, sodium chloride flush      Subjective:   Stavros Cail was seen and examined today.  Shortness of breath with diffuse wheezing.  No chest pain. Patient denies dizziness, chest pain, abdominal pain, N/V/D/C, new weakness, numbess, tingling. No acute  events overnight.    Objective:   Vitals:   03/09/19 0227 03/09/19 0700 03/09/19 0833 03/09/19 0958  BP:   (!) 123/58   Pulse:   81   Resp:      Temp:      TempSrc:      SpO2:  92% 91%   Weight: 56.8 kg   122.2 kg  Height:        Intake/Output Summary (Last 24 hours) at 03/09/2019 1151 Last data filed at 03/09/2019 1000 Gross per 24 hour  Intake 765.19 ml  Output 4400 ml  Net -3634.81 ml     Wt Readings from Last 3 Encounters:  03/09/19 122.2 kg  01/24/19 115.8 kg  01/14/19 116.6 kg     Exam  General: Alert and oriented x  3, NAD  Eyes:  HEENT:    Cardiovascular: S1 S2 auscultated,  Regular rate and rhythm.  Respiratory: Diffuse bilateral expiratory wheezing  Gastrointestinal: Soft, nontender, nondistended, + bowel sounds  Ext: 2+  pedal edema bilaterally  Neuro: No new deficits  Musculoskeletal: No digital cyanosis, clubbing  Skin: No rashes  Psych: Normal affect and demeanor, alert and oriented x3    Data Reviewed:  I have personally reviewed following labs and imaging studies  Micro Results Recent Results (from the past 240 hour(s))  SARS Coronavirus 2 (CEPHEID- Performed in [place] hospital lab), Hosp Order     Status: None   Collection Time: 03/06/19  6:29 PM  Result Value Ref Range Status   SARS Coronavirus 2 NEGATIVE NEGATIVE Final    Comment: (NOTE) If result is NEGATIVE SARS-CoV-2 target nucleic acids are NOT DETECTED. The SARS-CoV-2 RNA is generally detectable in upper and lower  respiratory specimens during the acute phase of infection. The lowest  concentration of SARS-CoV-2 viral copies this assay can detect is 250  copies / mL. A negative result does not preclude SARS-CoV-2 infection  and should not be used as the sole basis for treatment or other  patient management decisions.  A negative result may occur with  improper specimen collection / handling, submission of specimen other  than nasopharyngeal swab, presence of viral  mutation(s) within the  areas targeted by this assay, and inadequate number of viral copies  (<250 copies / mL). A negative result must be combined with clinical  observations, patient history, and epidemiological information. If result is POSITIVE SARS-CoV-2 target nucleic acids are DETECTED. The SARS-CoV-2 RNA is generally detectable in upper and lower  respiratory specimens dur ing the acute phase of infection.  Positive  results are indicative of active infection with SARS-CoV-2.  Clinical  correlation with patient history and other diagnostic information is  necessary to determine patient infection status.  Positive results do  not rule out bacterial infection or co-infection with other viruses. If result is PRESUMPTIVE POSTIVE SARS-CoV-2 nucleic acids MAY BE PRESENT.   A presumptive positive result was obtained on the submitted specimen  and confirmed on repeat testing.  While 2019 novel coronavirus  (SARS-CoV-2) nucleic acids may be present in the submitted sample  additional confirmatory testing may be necessary for epidemiological  and / or clinical management purposes  to differentiate between  SARS-CoV-2 and other Sarbecovirus currently known to infect humans.  If clinically indicated additional testing with an alternate test  methodology [id number]) is advised. The SARS-CoV-2 RNA is generally  detectable in upper and lower respiratory sp ecimens during the acute  phase of infection. The expected result is Negative. Fact Sheet for Patients:  StrictlyIdeas.no Fact Sheet for Healthcare Providers: BankingDealers.co.za This test is not yet approved or cleared by the [place] FDA and has been authorized for detection and/or diagnosis of SARS-CoV-2 by FDA under an Emergency Use Authorization (EUA).  This EUA will remain in effect (meaning this test can be used) for the duration of the COVID-19 declaration under Section 564(b)(1)  of the Act, 21 U.S.C. section 360bbb-3(b)(1), unless the authorization is terminated or revoked sooner. Performed at [facility], [place] [address] [place], [place] [address]   Urine culture     Status: Abnormal   Collection Time: 03/06/19  6:29 PM  Result Value Ref Range Status   Specimen Description URINE, RANDOM  Final   Special Requests NONE  Final   Culture (A)  Final    <  10,000 COLONIES/mL INSIGNIFICANT GROWTH Performed at [facility], [place] [address], [place], [place] [address]    Report Status 03/07/2019 FINAL  Final  Blood culture (routine x 2)     Status: Abnormal   Collection Time: 03/06/19  6:54 PM  Result Value Ref Range Status   Specimen Description BLOOD RIGHT ANTECUBITAL  Final   Special Requests   Final    BOTTLES DRAWN AEROBIC AND ANAEROBIC Blood Culture adequate volume   Culture  Setup Time   Final    GRAM POSITIVE COCCI IN BOTH AEROBIC AND ANAEROBIC BOTTLES CRITICAL RESULT CALLED TO, READ BACK BY AND VERIFIED WITH: PHARMD T DANG [id number] MLM    Culture (A)  Final    STAPHYLOCOCCUS SPECIES (COAGULASE NEGATIVE) THE SIGNIFICANCE OF ISOLATING THIS ORGANISM FROM A SINGLE SET OF BLOOD CULTURES WHEN MULTIPLE SETS ARE DRAWN IS UNCERTAIN. PLEASE NOTIFY THE MICROBIOLOGY DEPARTMENT WITHIN ONE WEEK IF SPECIATION AND SENSITIVITIES ARE REQUIRED. Performed at [facility], [place] [address] [place], [place] [address]    Report Status 03/09/2019 FINAL  Final  Blood Culture ID Panel (Reflexed)     Status: Abnormal   Collection Time: 03/06/19  6:54 PM  Result Value Ref Range Status   Enterococcus species NOT DETECTED NOT DETECTED Final   Listeria monocytogenes NOT DETECTED NOT DETECTED Final   Staphylococcus species DETECTED (A) NOT DETECTED Final    Comment: Methicillin (oxacillin) resistant coagulase negative staphylococcus. Possible blood culture contaminant (unless isolated from more than one blood culture draw or clinical case suggests pathogenicity).  No antibiotic treatment is indicated for blood  culture contaminants. CRITICAL RESULT CALLED TO, READ BACK BY AND VERIFIED WITH: PHARMD T DANG [id number] MLM    Staphylococcus aureus (BCID) NOT DETECTED NOT DETECTED Final   Methicillin resistance DETECTED (A) NOT DETECTED Final    Comment: CRITICAL RESULT CALLED TO, READ BACK BY AND VERIFIED WITH: PHARMD T DANG [id number] MLM    Streptococcus species NOT DETECTED NOT DETECTED Final   Streptococcus agalactiae NOT DETECTED NOT DETECTED Final   Streptococcus pneumoniae NOT DETECTED NOT DETECTED Final   Streptococcus pyogenes NOT DETECTED NOT DETECTED Final   Acinetobacter baumannii NOT DETECTED NOT DETECTED Final   Enterobacteriaceae species NOT DETECTED NOT DETECTED Final   Enterobacter cloacae complex NOT DETECTED NOT DETECTED Final   Escherichia coli NOT DETECTED NOT DETECTED Final   Klebsiella oxytoca NOT DETECTED NOT DETECTED Final   Klebsiella pneumoniae NOT DETECTED NOT DETECTED Final   Proteus species NOT DETECTED NOT DETECTED Final   Serratia marcescens NOT DETECTED NOT DETECTED Final   Haemophilus influenzae NOT DETECTED NOT DETECTED Final   Neisseria meningitidis NOT DETECTED NOT DETECTED Final   Pseudomonas aeruginosa NOT DETECTED NOT DETECTED Final   Candida albicans NOT DETECTED NOT DETECTED Final   Candida glabrata NOT DETECTED NOT DETECTED Final   Candida krusei NOT DETECTED NOT DETECTED Final   Candida parapsilosis NOT DETECTED NOT DETECTED Final   Candida tropicalis NOT DETECTED NOT DETECTED Final    Comment: Performed at [facility], [address], [place], [place] [address]  Blood culture (routine x 2)     Status: None (Preliminary result)   Collection Time: 03/06/19  6:59 PM  Result Value Ref Range Status   Specimen Description BLOOD LEFT HAND  Final   Special Requests   Final    BOTTLES DRAWN AEROBIC AND ANAEROBIC Blood Culture results may not be optimal due to an inadequate volume of blood  received in  culture bottles   Culture   Final    NO GROWTH 3 DAYS Performed at [facility], [place] [address] [place], [place] [address]    Report Status PENDING  Incomplete  Culture, blood (routine x 2)     Status: None (Preliminary result)   Collection Time: 03/07/19  6:42 PM  Result Value Ref Range Status   Specimen Description BLOOD RIGHT HAND  Final   Special Requests   Final    BOTTLES DRAWN AEROBIC AND ANAEROBIC Blood Culture adequate volume   Culture   Final    NO GROWTH 2 DAYS Performed at [facility], [place] [address], [place], [place] [address]    Report Status PENDING  Incomplete  Culture, blood (routine x 2)     Status: None (Preliminary result)   Collection Time: 03/07/19  6:42 PM  Result Value Ref Range Status   Specimen Description BLOOD LEFT HAND  Final   Special Requests   Final    BOTTLES DRAWN AEROBIC ONLY Blood Culture results may not be optimal due to an inadequate volume of blood received in culture bottles   Culture   Final    NO GROWTH 2 DAYS Performed at [facility], [place] [address], [place], [place] [address]    Report Status PENDING  Incomplete    Radiology Reports Dg Chest 2 View  Result Date: 03/06/2019 CLINICAL DATA:  Shortness of breath EXAM: CHEST - 2 VIEW COMPARISON:  01/11/2011 FINDINGS: Bilateral interstitial and alveolar airspace opacities. Small left pleural effusion. No pneumothorax. Stable cardiomegaly. No acute osseous abnormality. IMPRESSION: Bilateral interstitial and alveolar airspace opacities with stable cardiomegaly. Differential considerations include pulmonary edema versus multilobar pneumonia. Electronically Signed   By: Kathreen Devoid   On: 03/06/2019 19:07   Dg Chest Port 1 View  Result Date: 03/08/2019 CLINICAL DATA:  Increased short of breath EXAM: PORTABLE CHEST 1 VIEW COMPARISON:  03/06/2019, 01/11/2019 FINDINGS: Cardiomegaly with vascular congestion and diffuse bilateral interstitial and ground-glass opacities suspicious  for edema. Probable bilateral pleural effusions. Left basilar airspace consolidation. No pneumothorax. IMPRESSION: Cardiomegaly with worsening vascular congestion, pleural effusion, and interstitial and ground-glass opacities suspicious for edema. Left lung base consolidation could reflect atelectasis or pneumonia Electronically Signed   By: Donavan Foil M.D.   On: 03/08/2019 21:35   [place] Ekg Site Rite  Result Date: 03/09/2019 If Site Rite image not attached, placement could not be confirmed due to current cardiac rhythm.   Lab Data:  CBC: Recent Labs  Lab 03/06/19 1829  03/07/19 0524 03/07/19 1842 03/08/19 0412 03/08/19 1846 03/09/19 0457  WBC 10.3  --  10.4 8.8 9.0  --  10.5  NEUTROABS 8.1*  --   --  6.1  --   --  8.0*  HGB 6.7*   < > 7.0* 7.5* 6.9* 8.1* 8.0*  HCT 24.7*   < > 24.3* 26.0* 23.3* 27.9* 28.0*  MCV 86.7  --  83.2 83.3 82.3  --  84.3  PLT 625*  --  582* 603* 549*  --  548*   < > = values in this interval not displayed.   Basic Metabolic Panel: Recent Labs  Lab 03/06/19 1829 03/06/19 1839 03/07/19 0524 03/08/19 0412 03/09/19 0457  NA 136 138 141 140 140  K 4.7 4.7 4.5 3.7 2.8*  CL 95*  --  99 92* 91*  CO2 29  --  35* 35* 39*  GLUCOSE 97  --  113* 114*  121*  BUN 18  --  19 27* 28*  CREATININE 1.12  --  1.19 1.62* 1.71*  CALCIUM 8.6*  --  8.3* 8.3* 8.2*  MG  --   --   --  2.3  --    GFR: Estimated Creatinine Clearance: 54.2 mL/min (A) (by C-G formula based on SCr of 1.71 mg/dL (H)). Liver Function Tests: Recent Labs  Lab 03/06/19 1829  AST 16  ALT 12  ALKPHOS 120  BILITOT 0.4  PROT 7.4  ALBUMIN 3.2*   No results for input(s): LIPASE, AMYLASE in the last 168 hours. Recent Labs  Lab 03/06/19 1854  AMMONIA 24   Coagulation Profile: No results for input(s): INR, PROTIME in the last 168 hours. Cardiac Enzymes: Recent Labs  Lab 03/06/19 1829 03/06/19 2103  TROPONINI 0.05* 0.04*   BNP (last 3 results) No results for input(s): PROBNP in the  last 8760 hours. HbA1C: No results for input(s): HGBA1C in the last 72 hours. CBG: Recent Labs  Lab 03/08/19 0602 03/08/19 1115 03/08/19 1644 03/08/19 2110 03/09/19 0607  GLUCAP 134* 225* 136* 150* 113*   Lipid Profile: Recent Labs    03/08/19 0412  CHOL 83  HDL 43  LDLCALC 18  TRIG 109  CHOLHDL 1.9   Thyroid Function Tests: No results for input(s): TSH, T4TOTAL, FREET4, T3FREE, THYROIDAB in the last 72 hours. Anemia Panel: Recent Labs    03/06/19 2103  VITAMINB12 234  FOLATE 13.0  FERRITIN 8*  TIBC 437  IRON 12*  RETICCTPCT 2.8   Urine analysis:    Component Value Date/Time   COLORURINE COLORLESS (A) 03/06/2019 1829   APPEARANCEUR CLEAR 03/06/2019 1829   LABSPEC 1.006 03/06/2019 1829   PHURINE 5.0 03/06/2019 1829   GLUCOSEU NEGATIVE 03/06/2019 1829   HGBUR NEGATIVE 03/06/2019 1829   BILIRUBINUR NEGATIVE 03/06/2019 1829   KETONESUR NEGATIVE 03/06/2019 1829   PROTEINUR NEGATIVE 03/06/2019 1829   UROBILINOGEN 0.2 10/26/2014 2206   NITRITE NEGATIVE 03/06/2019 1829   LEUKOCYTESUR NEGATIVE 03/06/2019 1829     Ripudeep Rai M.D. [facility] 03/09/2019, 11:51 AM  Pager: [phone] Between 7am to 7pm - call Pager - [phone]  After 7pm go to [email or web address] - password [email or web address]  Call night coverage person covering after 7pm

## 2019-03-09 NOTE — Care Management (Signed)
Pt tested negative for COVID 03/06/19

## 2019-03-09 NOTE — Progress Notes (Signed)
Patient ID: Nicholas Caldwell, male   DOB: 1946/11/03, 73 y.o.   MRN: [id number]     Advanced Heart Failure Rounding Note  PCP-Cardiologist: No primary care provider on file.   Subjective:    Still feels short of breath. Not getting out of bed.  I/Os negative but not markedly so.  Creatinine mildly higher at 1.7.   CXR 5/5: Pulmonary edema, left base consolidation.  Afebrile.  He had 1 blood culture positive for suspected coagulase negative Staph (contaminant).   He has had 2 units PRBCs, hgb up to 8 today.  Remains off Xarelto.  He remains in atypical atrial flutter, rate 80s.     Objective:   Weight Range: 56.8 kg Body mass index is 16.99 kg/m.   Vital Signs:   Temp:  [97.7 F (36.5 C)-98.7 F (37.1 C)] 97.7 F (36.5 C) (05/06 0034) Pulse Rate:  [65-82] 81 (05/06 0833) Resp:  [18-20] 20 (05/06 0034) BP: (98-129)/(57-73) 123/58 (05/06 0833) SpO2:  [91 %-97 %] 91 % (05/06 0833) Weight:  [56.8 kg] 56.8 kg (05/06 0227) Last BM Date: 03/07/19  Weight change: Filed Weights   03/07/19 0121 03/08/19 0207 03/09/19 0227  Weight: 123.4 kg 125.3 kg 56.8 kg    Intake/Output:   Intake/Output Summary (Last 24 hours) at 03/09/2019 0907 Last data filed at 03/09/2019 0600 Gross per 24 hour  Intake 525.19 ml  Output 2400 ml  Net -1874.81 ml      Physical Exam    General: NAD Neck: JVP 14+, no thyromegaly or thyroid nodule.  Lungs: Bilateral expiratory wheezes. CV: Nondisplaced PMI.  Heart regular S1/S2, no S3/S4, no murmur.  1+ edema to knees.   Abdomen: Soft, nontender, no hepatosplenomegaly, mild distention.  Skin: Intact without lesions or rashes.  Neurologic: Alert and oriented x 3.  Psych: Normal affect. Extremities: No clubbing or cyanosis.  HEENT: Normal.    Telemetry   Atrial flutter rate in 80s (personally reviewed).   Labs    CBC Recent Labs    03/07/19 1842 03/08/19 0412 03/08/19 1846 03/09/19 0457  WBC 8.8 9.0  --  10.5  NEUTROABS 6.1  --   --  8.0*   HGB 7.5* 6.9* 8.1* 8.0*  HCT 26.0* 23.3* 27.9* 28.0*  MCV 83.3 82.3  --  84.3  PLT 603* 549*  --  413*   Basic Metabolic Panel Recent Labs    03/08/19 0412 03/09/19 0457  NA 140 140  K 3.7 2.8*  CL 92* 91*  CO2 35* 39*  GLUCOSE 114* 121*  BUN 27* 28*  CREATININE 1.62* 1.71*  CALCIUM 8.3* 8.2*  MG 2.3  --    Liver Function Tests Recent Labs    03/06/19 1829  AST 16  ALT 12  ALKPHOS 120  BILITOT 0.4  PROT 7.4  ALBUMIN 3.2*   No results for input(s): LIPASE, AMYLASE in the last 72 hours. Cardiac Enzymes Recent Labs    03/06/19 1829 03/06/19 2103  TROPONINI 0.05* 0.04*    BNP: BNP (last 3 results) Recent Labs    12/01/18 0015 12/30/18 1621 03/06/19 1829  BNP 1,666.7* 1,273.0* 1,020.1*    ProBNP (last 3 results) No results for input(s): PROBNP in the last 8760 hours.   D-Dimer No results for input(s): DDIMER in the last 72 hours. Hemoglobin A1C No results for input(s): HGBA1C in the last 72 hours. Fasting Lipid Panel Recent Labs    03/08/19 0412  CHOL 83  HDL 43  LDLCALC 18  TRIG 109  CHOLHDL 1.9   Thyroid Function Tests No results for input(s): TSH, T4TOTAL, T3FREE, THYROIDAB in the last 72 hours.  Invalid input(s): FREET3  Other results:   Imaging    Dg Chest Port 1 View  Result Date: 03/08/2019 CLINICAL DATA:  Increased short of breath EXAM: PORTABLE CHEST 1 VIEW COMPARISON:  03/06/2019, 01/11/2019 FINDINGS: Cardiomegaly with vascular congestion and diffuse bilateral interstitial and ground-glass opacities suspicious for edema. Probable bilateral pleural effusions. Left basilar airspace consolidation. No pneumothorax. IMPRESSION: Cardiomegaly with worsening vascular congestion, pleural effusion, and interstitial and ground-glass opacities suspicious for edema. Left lung base consolidation could reflect atelectasis or pneumonia Electronically Signed   By: Donavan Foil M.D.   On: 03/08/2019 21:35   [place] Ekg Site Rite  Result Date:  03/09/2019 If Site Rite image not attached, placement could not be confirmed due to current cardiac rhythm.    Medications:     Scheduled Medications: . sodium chloride   Intravenous Once  . bisoprolol  2.5 mg Oral Daily  . docusate sodium  100 mg Oral Daily  . ferrous sulfate  325 mg Oral BID WC  . folic acid  1 mg Oral Daily  . gabapentin  600 mg Oral BID  . insulin aspart  0-15 Units Subcutaneous TID WC  . insulin NPH Human  10 Units Subcutaneous BID AC & HS  . ipratropium-albuterol  3 mL Nebulization BID  . isosorbide-hydrALAZINE  1 tablet Oral TID  . latanoprost  1 drop Both Eyes QHS  . mouth rinse  15 mL Mouth Rinse BID  . metolazone  5 mg Oral Once  . mometasone-formoterol  2 puff Inhalation BID  . nicotine  7 mg Transdermal Daily  . pantoprazole  40 mg Oral BID  . polyethylene glycol  17 g Oral Daily  . potassium chloride  20 mEq Oral BID  . potassium chloride  40 mEq Oral Once  . potassium chloride  40 mEq Oral Once  . rosuvastatin  20 mg Oral q1800  . sodium chloride flush  3 mL Intravenous Q12H  . spironolactone  12.5 mg Oral Daily    Infusions: . sodium chloride    . furosemide (LASIX) infusion 12 mg/hr (03/08/19 1757)    PRN Medications: sodium chloride, acetaminophen, fluticasone, guaiFENesin, ondansetron (ZOFRAN) IV, oxyCODONE-acetaminophen, sodium chloride flush   Assessment/Plan   1. Acute on chronic systolic CHF: Nonischemic cardiomyopathy.  With prominent RV failure, likely related to COPD/OSA/OHS. TEE in 3/20 with EF 40-45%, severely dilated RV with moderately decreased systolic function. He has had multiple recent admissions with CHF and COPD exacerbations.  He has not cooperated with paramedicine or [facility] home [facility] as outpatient, has not been using CPAP per his wife, suspect significant medication noncompliance as well though he denies.  He remains markedly volume overloaded on exam today, some diuresis yesterday.  Creatinine mildly higher at 1.7.   - Increase Lasix to 15 mg/hr and will give 5 mg po metolazone x 1.  He will need aggressive K repletion and repeat BMET in afternoon.  - Continue bisoprolol 2.5 daily.  - Continue Bidil 1 tab tid.  - Add spironolactone 12.5 mg daily.   - I will place PICC to follow CVP and co-ox.  May need milrinone for RV support while diuresing (has RV > LV failure I think). Will need step down transfer for CVP monitoring.  - Cardiomems may be helpful as outpatient.  2. Anemia: Baseline hgb around 8.  6.7 at admission, denies overt GI bleeding.  He has had 2 unit PRBCs and IV Fe.  Hgb 8 today.   - Hold Xarelto for now.  - GI has seen, will need eventual scope when volume status improved.  Ideally, would have him on Xarelto to allow TEE-guided DCCV to get him out of atrial flutter but need GI workup first.  3. Atrial fibrillation/flutter: Patient has history of paroxysmal atrial fibrillation.  This admission, he appears to be in an atypical atrial flutter.  Rate is currently controlled. Xarelto on hold with GI bleeding.  Was seen in past by Dr. Rayann Heman, not thought to be good ablation candidate.  He has not been on Tikosyn or amiodarone due to history of long QT.  - Needs GI workup for source of anemia.  When he is back on Xarelto, would plan TEE-guided DCCV.  - Will eventually ask EP to reconsider ablation given lack of good anti-arrhythmic options. He seems to do better in NSR.  4. COPD: Severe, on 3L home oxygen.  He has diffuse wheezing on exam.  This may be due to pulmonary edema, but also consider component of COPD exacerbation.  - Getting Duonebs.  - Not on steroids, per primary service.  5. OHS/OSA: On home oxygen.  Per wife, has not been using CPAP at home.  Will give CPAP at night here.  6. ID: Suspect this is primarily a CHF exacerbation.  Afebrile and WBCs not significantly elevated.  1 blood culture positive for what appears to be coagulase negative Staph (probably contaminant).  COVID-19 negative.  7.  CAD: Nonobstructive on prior cath.  He is on Crestor.    Length of Stay: 3  Loralie Champagne, MD  03/09/2019, 9:07 AM  Advanced Heart Failure Team Pager [phone] (M-F; 7a - 4p)  Please contact [facility] for night-coverage after hours (4p -7a ) and weekends on [email or web address]

## 2019-03-09 NOTE — Progress Notes (Signed)
Peripherally Inserted Central Catheter/Midline Placement  The IV Nurse has discussed with the patient and/or persons authorized to consent for the patient, the purpose of this procedure and the potential benefits and risks involved with this procedure.  The benefits include less needle sticks, lab draws from the catheter, and the patient may be discharged home with the catheter. Risks include, but not limited to, infection, bleeding, blood clot (thrombus formation), and puncture of an artery; nerve damage and irregular heartbeat and possibility to perform a PICC exchange if needed/ordered by physician.  Alternatives to this procedure were also discussed.  Bard Power PICC patient education guide, fact sheet on infection prevention and patient information card has been provided to patient /or left at bedside.    PICC/Midline Placement Documentation  PICC Double Lumen 03/09/19 PICC Right Brachial 41 cm 0 cm (Active)  Indication for Insertion or Continuance of Line Vasoactive infusions 03/09/2019 11:08 AM  Exposed Catheter (cm) 0 cm 03/09/2019 11:08 AM  Site Assessment Dry;Clean;Intact 03/09/2019 11:08 AM  Lumen #1 Status Flushed;Blood return noted 03/09/2019 11:08 AM  Lumen #2 Status Flushed;Blood return noted 03/09/2019 11:08 AM  Dressing Type Transparent 03/09/2019 11:08 AM  Dressing Status Clean;Dry;Intact;Antimicrobial disc in place;Other (Comment) 03/09/2019 11:08 AM  Dressing Intervention New dressing 03/09/2019 11:08 AM  Dressing Change Due 03/16/19 03/09/2019 11:08 AM       Nicholas Caldwell 03/09/2019, 11:10 AM

## 2019-03-10 DIAGNOSIS — D500 Iron deficiency anemia secondary to blood loss (chronic): Secondary | ICD-10-CM

## 2019-03-10 DIAGNOSIS — J42 Unspecified chronic bronchitis: Secondary | ICD-10-CM

## 2019-03-10 LAB — CBC WITH DIFFERENTIAL/PLATELET
Abs Immature Granulocytes: 0 10*3/uL (ref 0.00–0.07)
Basophils Absolute: 0 10*3/uL (ref 0.0–0.1)
Basophils Relative: 0 %
Eosinophils Absolute: 0 10*3/uL (ref 0.0–0.5)
Eosinophils Relative: 0 %
HCT: 28 % — ABNORMAL LOW (ref 39.0–52.0)
Hemoglobin: 8 g/dL — ABNORMAL LOW (ref 13.0–17.0)
Lymphocytes Relative: 4 %
Lymphs Abs: 0.4 10*3/uL — ABNORMAL LOW (ref 0.7–4.0)
MCH: 24.7 pg — ABNORMAL LOW (ref 26.0–34.0)
MCHC: 28.6 g/dL — ABNORMAL LOW (ref 30.0–36.0)
MCV: 86.4 fL (ref 80.0–100.0)
Monocytes Absolute: 0.1 10*3/uL (ref 0.1–1.0)
Monocytes Relative: 1 %
Neutro Abs: 8.6 10*3/uL — ABNORMAL HIGH (ref 1.7–7.7)
Neutrophils Relative %: 95 %
Platelets: 501 10*3/uL — ABNORMAL HIGH (ref 150–400)
RBC: 3.24 MIL/uL — ABNORMAL LOW (ref 4.22–5.81)
RDW: 21.7 % — ABNORMAL HIGH (ref 11.5–15.5)
WBC: 9 10*3/uL (ref 4.0–10.5)
nRBC: 2.9 % — ABNORMAL HIGH (ref 0.0–0.2)
nRBC: 4 /100 WBC — ABNORMAL HIGH

## 2019-03-10 LAB — GLUCOSE, CAPILLARY
Glucose-Capillary: 172 mg/dL — ABNORMAL HIGH (ref 70–99)
Glucose-Capillary: 338 mg/dL — ABNORMAL HIGH (ref 70–99)
Glucose-Capillary: 245 mg/dL — ABNORMAL HIGH (ref 70–99)
Glucose-Capillary: 240 mg/dL — ABNORMAL HIGH (ref 70–99)

## 2019-03-10 LAB — COOXEMETRY PANEL
Carboxyhemoglobin: 1.9 % — ABNORMAL HIGH (ref 0.5–1.5)
Methemoglobin: 1 % (ref 0.0–1.5)
O2 Saturation: 62.5 %
Total hemoglobin: 8.2 g/dL — ABNORMAL LOW (ref 12.0–16.0)

## 2019-03-10 LAB — BASIC METABOLIC PANEL
Anion gap: 13 (ref 5–15)
BUN: 36 mg/dL — ABNORMAL HIGH (ref 8–23)
CO2: 39 mmol/L — ABNORMAL HIGH (ref 22–32)
Calcium: 8.6 mg/dL — ABNORMAL LOW (ref 8.9–10.3)
Chloride: 89 mmol/L — ABNORMAL LOW (ref 98–111)
Creatinine, Ser: 1.79 mg/dL — ABNORMAL HIGH (ref 0.61–1.24)
GFR calc Af Amer: 44 mL/min — ABNORMAL LOW (ref >60)
GFR calc non Af Amer: 38 mL/min — ABNORMAL LOW (ref >60)
Glucose, Bld: 189 mg/dL — ABNORMAL HIGH (ref 70–99)
Potassium: 3.2 mmol/L — ABNORMAL LOW (ref 3.5–5.1)
Sodium: 141 mmol/L (ref 135–145)

## 2019-03-10 MED ORDER — POTASSIUM CHLORIDE CRYS ER 20 MEQ PO TBCR
40.0000 meq | EXTENDED_RELEASE_TABLET | Freq: Every day | ORAL | Status: DC
  Administered 2019-03-10 – 2019-03-13 (×3): 40 meq via ORAL
  Filled 2019-03-10 (×3): qty 2

## 2019-03-10 MED ORDER — METOLAZONE 5 MG PO TABS
5.0000 mg | ORAL_TABLET | Freq: Once | ORAL | Status: AC
  Administered 2019-03-10: 5 mg via ORAL
  Filled 2019-03-10: qty 1

## 2019-03-10 MED ORDER — POTASSIUM CHLORIDE CRYS ER 20 MEQ PO TBCR
40.0000 meq | EXTENDED_RELEASE_TABLET | Freq: Once | ORAL | Status: AC
  Administered 2019-03-10: 40 meq via ORAL
  Filled 2019-03-10: qty 2

## 2019-03-10 MED ORDER — SODIUM CHLORIDE 0.9 % IV SOLN
INTRAVENOUS | Status: DC
  Administered 2019-03-11: 13:00:00 via INTRAVENOUS

## 2019-03-10 MED ORDER — PEG-KCL-NACL-NASULF-NA ASC-C 100 G PO SOLR: 1.0000 | Freq: Once | ORAL | Status: DC

## 2019-03-10 MED ORDER — SPIRONOLACTONE 25 MG PO TABS
25.0000 mg | ORAL_TABLET | Freq: Every day | ORAL | Status: DC
  Administered 2019-03-10 – 2019-03-31 (×22): 25 mg via ORAL
  Filled 2019-03-10 (×21): qty 1

## 2019-03-10 MED ORDER — PEG-KCL-NACL-NASULF-NA ASC-C 100 G PO SOLR
0.5000 | Freq: Once | ORAL | Status: DC
  Filled 2019-03-10 (×2): qty 1

## 2019-03-10 MED ORDER — METHYLPREDNISOLONE SODIUM SUCC 40 MG IJ SOLR
40.0000 mg | Freq: Two times a day (BID) | INTRAMUSCULAR | Status: DC
  Administered 2019-03-10 – 2019-03-13 (×5): 40 mg via INTRAVENOUS
  Filled 2019-03-10 (×6): qty 1

## 2019-03-10 MED ORDER — PEG-KCL-NACL-NASULF-NA ASC-C 100 G PO SOLR
0.5000 | Freq: Once | ORAL | Status: AC
  Administered 2019-03-10: 100 g via ORAL

## 2019-03-10 NOTE — Care Management Important Message (Signed)
Important Message  Patient Details  Name: Nicholas Caldwell MRN: [id number] Date of Birth: 1945-12-05   Medicare Important Message Given:  Yes    [name] 03/10/2019, 2:08 PM

## 2019-03-10 NOTE — Progress Notes (Addendum)
Inpatient Diabetes Program Recommendations  AACE/ADA: New Consensus Statement on Inpatient Glycemic Control (2015)  Target Ranges:  Prepandial:   less than 140 mg/dL      Peak postprandial:   less than 180 mg/dL (1-2 hours)      Critically ill patients:  140 - 180 mg/dL   Results for JEDIDIAH, DEMARTINI (MRN [id number]) as of 03/10/2019 06:27  Ref. Range 03/09/2019 06:07 03/09/2019 11:55 03/09/2019 16:28 03/09/2019 20:56  Glucose-Capillary Latest Ref Range: 70 - 99 mg/dL 113 (H)    10 units NPH given at 8am 182 (H)  3 units NOVOLOG  230 (H)  5 units NOVOLOG   342 (H)    10 units NPH given at 11pm   Results for MACALLISTER, ASHMEAD (MRN [id number]) as of 03/10/2019 06:27  Ref. Range 03/10/2019 06:10  Glucose-Capillary Latest Ref Range: 70 - 99 mg/dL 172 (H)     Home DM Meds: 70/30 Insulin- 30 units BID       Metformin 1000 mg BID  Current Orders: NPH 10 units BID      Novolog Moderate Correction Scale/ SSI (0-15 units) TID AC    Solumedrol 60 mg BID started yesterday (05/06) at 4pm.  CBGs yesterday afternoon severely elevated likely due to the IV steroids that were started.    MD- If you plan to keep patient on Solumedrol or switch to Prednisone today, may consider adding Novolog Meal Coverage:  Novolog 4 units TID with meals  (Please add the following Hold Parameters: Hold if pt eats <50% of meal, Hold if pt NPO)  May also consider adding bedtime SSI coverage to current SSI regimen (CBG at bedtime last night was 342 mg/dl)      --Will follow patient during hospitalization--  Wyn Quaker RN, MSN, CDE Diabetes Coordinator Inpatient Glycemic Control Team Team Pager: [phone] (8a-5p)

## 2019-03-10 NOTE — Progress Notes (Addendum)
[facility] ROUNDING NOTE   Subjective: No acute events overnight.  Wheezing improved and improved breathing.  Good diuresis again yesterday.   Objective: Vital signs in last 24 hours: Temp:  [97.6 F (36.4 C)-99.2 F (37.3 C)] 99.2 F (37.3 C) (05/07 1428) Pulse Rate:  [80-81] 80 (05/07 0425) Resp:  [23-24] 23 (05/07 0425) BP: (107-133)/(57-67) 125/60 (05/07 0745) SpO2:  [90 %-98 %] 94 % (05/07 1516) Weight:  [122.2 kg] 122.2 kg (05/07 0425) Last BM Date: 03/09/19 General: NAD Abdomen: Soft, NT, ND    Intake/Output from previous day: 05/06 0701 - 05/07 0700 In: 720 [P.O.:720] Out: 4525 [Urine:4525] Intake/Output this shift: Total I/O In: 1100 [P.O.:1100] Out: 400 [Urine:400]   Lab Results: Recent Labs    03/08/19 0412 03/08/19 1846 03/09/19 0457 03/10/19 0539  WBC 9.0  --  10.5 9.0  HGB 6.9* 8.1* 8.0* 8.0*  PLT 549*  --  548* 501*  MCV 82.3  --  84.3 86.4   BMET Recent Labs    03/08/19 0412 03/09/19 0457 03/10/19 0539  NA 140 140 141  K 3.7 2.8* 3.2*  CL 92* 91* 89*  CO2 35* 39* 39*  GLUCOSE 114* 121* 189*  BUN 27* 28* 36*  CREATININE 1.62* 1.71* 1.79*  CALCIUM 8.3* 8.2* 8.6*   LFT No results for input(s): PROT, ALBUMIN, AST, ALT, ALKPHOS, BILITOT, BILIDIR, IBILI in the last 72 hours. PT/INR No results for input(s): INR in the last 72 hours.    Imaging/Other results: Dg Chest Port 1 View  Result Date: 03/08/2019 CLINICAL DATA:  Increased short of breath EXAM: PORTABLE CHEST 1 VIEW COMPARISON:  03/06/2019, 01/11/2019 FINDINGS: Cardiomegaly with vascular congestion and diffuse bilateral interstitial and ground-glass opacities suspicious for edema. Probable bilateral pleural effusions. Left basilar airspace consolidation. No pneumothorax. IMPRESSION: Cardiomegaly with worsening vascular congestion, pleural effusion, and interstitial and ground-glass opacities suspicious for edema. Left lung base consolidation could reflect atelectasis  or pneumonia Electronically Signed   By: Kim  Fujinaga M.D.   On: 03/08/2019 21:35   [place] Ekg Site Rite  Result Date: 03/09/2019 If Site Rite image not attached, placement could not be confirmed due to current cardiac rhythm.     Assessment and Plan:  73-year-old male with multiple medical problems to include acute on chronic systolic CHF, IDA, atrial fibrillation/flutter, COPD on 3 L home O2, OSA, noncompliance, CAD, admitted with CHF exacerbation complicated by IDA, requiring PRBC transfusions and IV iron.  Last transfused on 5/5 with stable hemoglobin 8 today. Otherwise without overt GI blood loss.    1) Iron Deficiency Anemia: -Plan for EGD and colonoscopy for evaluation of bleeding/IDA, particularly to rule out high risk lesions prior to need for systemic anticoagulation.  Discussed with Hospitalist and ok for bowel preparation given improvement in volume status.  - EGD//colonoscopy for diagnostic and therapeutic intent prior to need for anticoagulation for planned TEE guided DCCV for atrial flutter.  -Daily H/H checks -N.p.o. at midnight - Will check cardiopulmonary/volume status in the morning prior to procedure - Sincerely appreciate the excellent care of this gentleman by the Hospitalist and Heart Failure services.  2) AKI  3) HF 4) Atrial fibrillation/flutter 5) COPD exacerbation, O2 dependent 6) CAD   The indications, risks, and benefits of EGD were explained to the patient in detail. Risks include but are not limited to bleeding, perforation, adverse reaction to medications, and cardiopulmonary compromise. Sequelae include but are not limited to the possibility of surgery, hositalization, and mortality. The patient verbalized understanding and   wished to proceed. All questions answered. Further recommendations pending results of the exam.      Austyn Perriello V Charita Lindenberger, DO  03/10/2019, 3:49 PM  [facility] Pager [phone]  

## 2019-03-10 NOTE — H&P (View-Only) (Signed)
[facility] ROUNDING NOTE   Subjective: No acute events overnight.  Wheezing improved and improved breathing.  Good diuresis again yesterday.   Objective: Vital signs in last 24 hours: Temp:  [97.6 F (36.4 C)-99.2 F (37.3 C)] 99.2 F (37.3 C) (05/07 1428) Pulse Rate:  [80-81] 80 (05/07 0425) Resp:  [23-24] 23 (05/07 0425) BP: (107-133)/(57-67) 125/60 (05/07 0745) SpO2:  [90 %-98 %] 94 % (05/07 1516) Weight:  [122.2 kg] 122.2 kg (05/07 0425) Last BM Date: 03/09/19 General: NAD Abdomen: Soft, NT, ND    Intake/Output from previous day: 05/06 0701 - 05/07 0700 In: 720 [P.O.:720] Out: 4525 [Urine:4525] Intake/Output this shift: Total I/O In: 1100 [P.O.:1100] Out: 400 [Urine:400]   Lab Results: Recent Labs    03/08/19 0412 03/08/19 1846 03/09/19 0457 03/10/19 0539  WBC 9.0  --  10.5 9.0  HGB 6.9* 8.1* 8.0* 8.0*  PLT 549*  --  548* 501*  MCV 82.3  --  84.3 86.4   BMET Recent Labs    03/08/19 0412 03/09/19 0457 03/10/19 0539  NA 140 140 141  K 3.7 2.8* 3.2*  CL 92* 91* 89*  CO2 35* 39* 39*  GLUCOSE 114* 121* 189*  BUN 27* 28* 36*  CREATININE 1.62* 1.71* 1.79*  CALCIUM 8.3* 8.2* 8.6*   LFT No results for input(s): PROT, ALBUMIN, AST, ALT, ALKPHOS, BILITOT, BILIDIR, IBILI in the last 72 hours. PT/INR No results for input(s): INR in the last 72 hours.    Imaging/Other results: Dg Chest Port 1 View  Result Date: 03/08/2019 CLINICAL DATA:  Increased short of breath EXAM: PORTABLE CHEST 1 VIEW COMPARISON:  03/06/2019, 01/11/2019 FINDINGS: Cardiomegaly with vascular congestion and diffuse bilateral interstitial and ground-glass opacities suspicious for edema. Probable bilateral pleural effusions. Left basilar airspace consolidation. No pneumothorax. IMPRESSION: Cardiomegaly with worsening vascular congestion, pleural effusion, and interstitial and ground-glass opacities suspicious for edema. Left lung base consolidation could reflect atelectasis  or pneumonia Electronically Signed   By: Donavan Foil M.D.   On: 03/08/2019 21:35   [place] Ekg Site Rite  Result Date: 03/09/2019 If Site Rite image not attached, placement could not be confirmed due to current cardiac rhythm.     Assessment and Plan:  73 year old male with multiple medical problems to include acute on chronic systolic CHF, IDA, atrial fibrillation/flutter, COPD on 3 L home O2, OSA, noncompliance, CAD, admitted with CHF exacerbation complicated by IDA, requiring PRBC transfusions and IV iron.  Last transfused on 5/5 with stable hemoglobin 8 today. Otherwise without overt GI blood loss.    1) Iron Deficiency Anemia: -Plan for EGD and colonoscopy for evaluation of bleeding/IDA, particularly to rule out high risk lesions prior to need for systemic anticoagulation.  Discussed with Hospitalist and ok for bowel preparation given improvement in volume status.  - EGD//colonoscopy for diagnostic and therapeutic intent prior to need for anticoagulation for planned TEE guided DCCV for atrial flutter.  -Daily H/H checks -N.p.o. at midnight - Will check cardiopulmonary/volume status in the morning prior to procedure - Sincerely appreciate the excellent care of this gentleman by the Hospitalist and Heart Failure services.  2) AKI  3) HF 4) Atrial fibrillation/flutter 5) COPD exacerbation, O2 dependent 6) CAD   The indications, risks, and benefits of EGD were explained to the patient in detail. Risks include but are not limited to bleeding, perforation, adverse reaction to medications, and cardiopulmonary compromise. Sequelae include but are not limited to the possibility of surgery, hositalization, and mortality. The patient verbalized understanding and  wished to proceed. All questions answered. Further recommendations pending results of the exam.      Lavena Bullion, DO  03/10/2019, 3:49 PM [facility] Pager [phone]

## 2019-03-10 NOTE — Evaluation (Signed)
Physical Therapy Evaluation Patient Details Name: Nicholas Caldwell MRN: [id number] DOB: 12/21/1945 Today's Date: 03/10/2019   History of Present Illness  73 yo male admitted with acute on chronic heart failure and respiratory failure. PMHx: CHF, chronic anemia, HLD, obesity, CAD, NICM, polyneuropathy, DM, COPD  Clinical Impression  Pt admitted with above diagnosis. Pt currently with functional limitations due to the deficits listed below (see PT Problem List). Pt was able to ambulate with RW with min guard assist a few steps to chair.  States wife is supportive.  Pt at times disagreeable but did get up.  Pt HR 109-124 bpm and O2 on 4L was >88% with small dip to 84% directly after transfer but returned to 90% quickly.  Pt will benefit from skilled PT to increase their independence and safety with mobility to allow discharge to the venue listed below.      Follow Up Recommendations Home health PT;Supervision/Assistance - 24 hour    Equipment Recommendations  None recommended by PT    Recommendations for Other Services       Precautions / Restrictions Precautions Precautions: Fall Restrictions Weight Bearing Restrictions: No      Mobility  Bed Mobility Overal bed mobility: Needs Assistance Bed Mobility: Supine to Sit     Supine to sit: Min assist     General bed mobility comments: Reaching to pull up on PT.  States wife helps him.   Transfers Overall transfer level: Needs assistance Equipment used: Rolling walker (2 wheeled) Transfers: Sit to/from [facility] Sit to Stand: Min assist;+2 safety/equipment Stand pivot transfers: Min assist;+2 physical assistance       General transfer comment: Pt was able to stand with cues for hand placement.  Once up needed steadying assist.   Ambulation/Gait Ambulation/Gait assistance: Min guard;+2 safety/equipment Gait Distance (Feet): 3 Feet Assistive device: Rolling walker (2 wheeled) Gait Pattern/deviations:  Step-through pattern;Decreased stride length;Trunk flexed;Wide base of support;Drifts right/left   Gait velocity interpretation: <1.31 ft/sec, indicative of household ambulator General Gait Details: Pt states he doesn't walk alot at home. Agreed to get to chair.  Able to take a few steps to chair with RW with mn guard assist and cues for safety as he needed cues to get close to chair and for hand placement. Slightly flexed posture.   Stairs            Wheelchair Mobility    Modified Rankin (Stroke Patients Only)       Balance Overall balance assessment: Needs assistance Sitting-balance support: No upper extremity supported;Feet supported Sitting balance-Leahy Scale: Fair     Standing balance support: Bilateral upper extremity supported;During functional activity Standing balance-Leahy Scale: Poor Standing balance comment: relies on UE support on RW.                               Pertinent Vitals/Pain Pain Assessment: No/denies pain    Home Living Family/patient expects to be discharged to:: Private residence Living Arrangements: Spouse/significant other Available Help at Discharge: Family;Available 24 hours/day Type of Home: House Home Access: Stairs to enter Entrance Stairs-Rails: None Entrance Stairs-Number of Steps: 1 Home Layout: One level Home Equipment: Walker - 2 wheels;Cane - single point;Bedside commode;Shower seat Additional Comments: on 3L of O2 at all times at home    Prior Function Level of Independence: Independent with assistive device(s)         Comments: denies falls between admssions.  Tells PT that he has  [facility] or sc[facility]     Hand Dominance   Dominant Hand: Right    Extremity/Trunk Assessment   Upper Extremity Assessment Upper Extremity Assessment: Defer to OT evaluation    Lower Extremity Assessment Lower Extremity Assessment: Generalized weakness    Cervical / Trunk  Assessment Cervical / Trunk Assessment: Kyphotic  Communication   Communication: No difficulties  Cognition Arousal/Alertness: Awake/alert Behavior During Therapy: WFL for tasks assessed/performed Overall Cognitive Status: Within Functional Limits for tasks assessed                                        General Comments      Exercises General Exercises - Lower Extremity Ankle Circles/Pumps: AROM;Both;10 reps;Seated Long Arc Quad: AROM;Both;10 reps;Seated   Assessment/Plan    PT Assessment Patient needs continued PT services  PT Problem List Decreased activity tolerance;Decreased balance;Decreased mobility;Decreased knowledge of use of DME;Decreased safety awareness;Decreased knowledge of precautions       PT Treatment Interventions DME instruction;Gait training;Functional mobility training;Therapeutic activities;Therapeutic exercise;Balance training;Patient/family education;Stair training    PT Goals (Current goals can be found in the Care Plan section)  Acute Rehab PT Goals Patient Stated Goal: to go home PT Goal Formulation: With patient Time For Goal Achievement: 03/24/19 Potential to Achieve Goals: Good    Frequency Min 3X/week   Barriers to discharge        Co-evaluation               AM-PAC PT "6 Clicks" Mobility  Outcome Measure Help needed turning from your back to your side while in a flat bed without using bedrails?: None Help needed moving from lying on your back to sitting on the side of a flat bed without using bedrails?: A Little Help needed moving to and from a bed to a chair (including a wheelchair)?: A Little Help needed standing up from a chair using your arms (e.g., wheelchair or bedside chair)?: A Little Help needed to walk in hospital room?: A Little Help needed climbing 3-5 steps with a railing? : A Little 6 Click Score: 19    End of Session Equipment Utilized During Treatment: Gait belt Activity Tolerance: Patient  limited by fatigue Patient left: in chair;with call bell/phone within reach Nurse Communication: Mobility status PT Visit Diagnosis: Unsteadiness on feet (R26.81);Muscle weakness (generalized) (M62.81)    Time: 5176-1607 PT Time Calculation (min) (ACUTE ONLY): 18 min   Charges:   PT Evaluation $PT Eval Moderate Complexity: 1 Mod          Harold Moncus,PT Acute Rehabilitation Services Pager:  [phone]  Office:  [place] 03/10/2019, 1:53 PM

## 2019-03-10 NOTE — Progress Notes (Addendum)
[facility]                                                                              Patient Demographics  Nicholas Caldwell, is a 73 y.o. male, DOB - 12-Jul-1946, TSV:[id number]  Admit date - 03/06/2019   Admitting Physician Etta Quill, DO  Outpatient Primary MD for the patient is [facility]  Outpatient specialists:    LOS - 4  days   Medical records reviewed and are as summarized below:    Chief Complaint  Patient presents with   Shortness of Breath       Brief summary   73 year old male with history of combined systolic and diastolic CHF with EF of 92-33% (per TEE in March), paroxysmal A. fib on Xarelto, COPD on 3 L home O2, noncompliance with inhalers with ongoing tobacco use, possibly diuretic, morbid obesity with OSA not adherent to CPAP presented with acute on chronic hypoxic respiratory failure secondary to acute on chronic combined systolic and diastolic CHF.  Patient had gross anasarca with pulmonary edema on chest x-ray and BNP of thousand.  Required nebs in the ED, higher oxygen and IV Lasix.  Admitted for aggressive IV diuresis.  Also found to have severe iron deficiency anemia.   Assessment & Plan    Principal Problem:   Acute on chronic respiratory failure with hypoxia and hypercapnia (HCC) -Likely secondary to acute on chronic combined systolic and diastolic CHF, COPD exacerbation -Wean O2 as tolerated, O2 sats 91% on 4 L -Management as below  Active Problems:  Acute on chronic combined systolic and diastolic CHF.  Underlying nonischemic cardiomyopathy,  RV failure secondary to COPD, OSA, OHS, medication noncompliance -Still volume overloaded, anasarca, continue Lasix drip -TEE in 3/20 with EF 40 to 45% with severely dilated RV and moderately decreased systolic function. -HF team following, continue beta-blocker, BiDil, spironolactone increased to 25 mg daily, metolazone 5 mg x 1 -Strict I's and O's, negative balance of 6.0  L - weight on admission 265lbs->276-> today 269lbs  Acute COPD exacerbation -Wheezing better today, has underlying severe COPD.  On O2 3 L at home -Currently on maximal treatment with duo nebs, Pulmicort, Brovana, IV steroids.  -Taper Solu-Medrol to 40 mg every 12 hours, continue flutter valve, PPI   Severe iron deficiency anemia -Baseline hemoglobin around 8-9, hemoglobin 6.9 on 5/5, received 1 unit packed RBCs and Feraheme -Hemoglobin 8.0 today.  Hopefully, respiratory status will be better enough for endoscopy in a.m. -Continue PPI  Acute kidney injury -Creatinine stable at 1.7, monitor closely with Lasix and metolazone.   Essential hypertension Currently stable, continue bisoprolol, spironolactone  Type 2 diabetes mellitus with neuropathy -Continue Novolin twice daily and sliding scale insulin,  -CBGs expected to be elevated due to steroids, tapering down to 40 mg every 12 hours today   Hypokalemia -Currently on Lasix drip, continue replacement 40 meq daily  Paroxysmal atrial fibrillation Rate controlled, Xarelto currently on hold due to severe iron deficiency anemia  NSVT -Keep potassium above 4, aggressively replace, magnesium normal  Nicotine abuse Patient counseled on nicotine cessation, continue nicotine patch  Hyperlipidemia Continue statin   Code  Status: Full code DVT Prophylaxis: SCD's, Xarelto on hold Family Communication: Discussed in detail with the patient, all imaging results, lab results explained to the patient and wife on the phone   Disposition Plan: Moved to stepdown unit, currently on Lasix drip, still very volume overloaded, needs GI evaluation.  High risk of decompensation.   Time Spent in minutes 35 minutes  Procedures:  None  Consultants:   [facility]  Antimicrobials:   Anti-infectives (From admission, onward)   Start     Dose/Rate Route Frequency Ordered Stop   03/06/19 1945  ceFEPIme (MAXIPIME) 2 g in sodium  chloride 0.9 % 100 mL IVPB     2 g 200 mL/hr over 30 Minutes Intravenous  Once 03/06/19 1941 03/06/19 2046   03/06/19 1945  vancomycin (VANCOCIN) 2,000 mg in sodium chloride 0.9 % 500 mL IVPB     2,000 mg 250 mL/hr over 120 Minutes Intravenous  Once 03/06/19 1941 03/07/19 0015         Medications  Scheduled Meds:  sodium chloride   Intravenous Once   arformoterol  15 mcg Nebulization BID   bisoprolol  2.5 mg Oral Daily   budesonide (PULMICORT) nebulizer solution  0.25 mg Nebulization BID   docusate sodium  100 mg Oral Daily   ferrous sulfate  325 mg Oral BID WC   folic acid  1 mg Oral Daily   gabapentin  600 mg Oral BID   insulin aspart  0-15 Units Subcutaneous TID WC   insulin NPH Human  10 Units Subcutaneous BID AC & HS   ipratropium-albuterol  3 mL Nebulization TID   isosorbide-hydrALAZINE  1 tablet Oral TID   latanoprost  1 drop Both Eyes QHS   mouth rinse  15 mL Mouth Rinse BID   methylPREDNISolone (SOLU-MEDROL) injection  60 mg Intravenous Q12H   nicotine  7 mg Transdermal Daily   pantoprazole  40 mg Oral BID   polyethylene glycol  17 g Oral Daily   potassium chloride  40 mEq Oral Daily   potassium chloride  40 mEq Oral Once   rosuvastatin  20 mg Oral q1800   sodium chloride flush  10-40 mL Intracatheter Q12H   sodium chloride flush  3 mL Intravenous Q12H   spironolactone  25 mg Oral Daily   Continuous Infusions:  sodium chloride     furosemide (LASIX) infusion 15 mg/hr (03/10/19 0611)   PRN Meds:.sodium chloride, acetaminophen, fluticasone, guaiFENesin, ondansetron (ZOFRAN) IV, oxyCODONE-acetaminophen, sodium chloride flush, sodium chloride flush      Subjective:   Ryder Man was seen and examined today.  Shortness of breath and wheezing better today after starting steroids.  O2 sats still 89 to 91% on 4 L, higher than his baseline.  No chest pain.   Patient denies dizziness, chest pain, abdominal pain, N/V/D/C, new weakness,  numbess, tingling. No acute events overnight.    Objective:   Vitals:   03/10/19 0425 03/10/19 0745 03/10/19 0754 03/10/19 1105  BP: (!) 120/57 125/60    Pulse: 80     Resp: (!) 23     Temp: 98.5 F (36.9 C) 97.6 F (36.4 C)  98.8 F (37.1 C)  TempSrc: Oral Oral  Oral  SpO2: 92%  95%   Weight: 122.2 kg     Height:        Intake/Output Summary (Last 24 hours) at 03/10/2019 1110 Last data filed at 03/10/2019 0749 Gross per 24 hour  Intake 720 ml  Output 2525 ml  Net -1805 ml     Wt Readings from Last 3 Encounters:  03/10/19 122.2 kg  01/24/19 115.8 kg  01/14/19 116.6 kg   Physical Exam  General: Alert and oriented x 3, NAD, pleasant and cooperative  Eyes:   HEENT:  Atraumatic, normocephalic  Cardiovascular: S1 S2 clear, no murmurs, RRR  Respiratory: Minimal wheezing today  Gastrointestinal: Soft, nontender, nondistended, NBS  Ext: 1+ pedal edema bilaterally  Neuro: no new deficits  Musculoskeletal: No cyanosis, clubbing  Skin: No rashes  Psych: Normal affect and demeanor, alert and oriented x3     Data Reviewed:  I have personally reviewed following labs and imaging studies  Micro Results Recent Results (from the past 240 hour(s))  SARS Coronavirus 2 (CEPHEID- Performed in [place] hospital lab), Hosp Order     Status: None   Collection Time: 03/06/19  6:29 PM  Result Value Ref Range Status   SARS Coronavirus 2 NEGATIVE NEGATIVE Final    Comment: (NOTE) If result is NEGATIVE SARS-CoV-2 target nucleic acids are NOT DETECTED. The SARS-CoV-2 RNA is generally detectable in upper and lower  respiratory specimens during the acute phase of infection. The lowest  concentration of SARS-CoV-2 viral copies this assay can detect is 250  copies / mL. A negative result does not preclude SARS-CoV-2 infection  and should not be used as the sole basis for treatment or other  patient management decisions.  A negative result may occur with  improper specimen  collection / handling, submission of specimen other  than nasopharyngeal swab, presence of viral mutation(s) within the  areas targeted by this assay, and inadequate number of viral copies  (<250 copies / mL). A negative result must be combined with clinical  observations, patient history, and epidemiological information. If result is POSITIVE SARS-CoV-2 target nucleic acids are DETECTED. The SARS-CoV-2 RNA is generally detectable in upper and lower  respiratory specimens dur ing the acute phase of infection.  Positive  results are indicative of active infection with SARS-CoV-2.  Clinical  correlation with patient history and other diagnostic information is  necessary to determine patient infection status.  Positive results do  not rule out bacterial infection or co-infection with other viruses. If result is PRESUMPTIVE POSTIVE SARS-CoV-2 nucleic acids MAY BE PRESENT.   A presumptive positive result was obtained on the submitted specimen  and confirmed on repeat testing.  While 2019 novel coronavirus  (SARS-CoV-2) nucleic acids may be present in the submitted sample  additional confirmatory testing may be necessary for epidemiological  and / or clinical management purposes  to differentiate between  SARS-CoV-2 and other Sarbecovirus currently known to infect humans.  If clinically indicated additional testing with an alternate test  methodology [phone]) is advised. The SARS-CoV-2 RNA is generally  detectable in upper and lower respiratory sp ecimens during the acute  phase of infection. The expected result is Negative. Fact Sheet for Patients:  StrictlyIdeas.no Fact Sheet for Healthcare Providers: BankingDealers.co.za This test is not yet approved or cleared by the [place] FDA and has been authorized for detection and/or diagnosis of SARS-CoV-2 by FDA under an Emergency Use Authorization (EUA).  This EUA will remain in effect  (meaning this test can be used) for the duration of the COVID-19 declaration under Section 564(b)(1) of the Act, 21 U.S.C. section 360bbb-3(b)(1), unless the authorization is terminated or revoked sooner. Performed at [facility], [place] [address], [place], [place] [address]   Urine culture     Status: Abnormal  Collection Time: 03/06/19  6:29 PM  Result Value Ref Range Status   Specimen Description URINE, RANDOM  Final   Special Requests NONE  Final   Culture (A)  Final    <10,000 COLONIES/mL INSIGNIFICANT GROWTH Performed at [facility], [place] [address] [place], [place] [address]    Report Status 03/07/2019 FINAL  Final  Blood culture (routine x 2)     Status: Abnormal   Collection Time: 03/06/19  6:54 PM  Result Value Ref Range Status   Specimen Description BLOOD RIGHT ANTECUBITAL  Final   Special Requests   Final    BOTTLES DRAWN AEROBIC AND ANAEROBIC Blood Culture adequate volume   Culture  Setup Time   Final    GRAM POSITIVE COCCI IN BOTH AEROBIC AND ANAEROBIC BOTTLES CRITICAL RESULT CALLED TO, READ BACK BY AND VERIFIED WITH: PHARMD T DANG [id number] MLM    Culture (A)  Final    STAPHYLOCOCCUS SPECIES (COAGULASE NEGATIVE) THE SIGNIFICANCE OF ISOLATING THIS ORGANISM FROM A SINGLE SET OF BLOOD CULTURES WHEN MULTIPLE SETS ARE DRAWN IS UNCERTAIN. PLEASE NOTIFY THE MICROBIOLOGY DEPARTMENT WITHIN ONE WEEK IF SPECIATION AND SENSITIVITIES ARE REQUIRED. Performed at [facility], [place] [address], [place], [place] [address]    Report Status 03/09/2019 FINAL  Final  Blood Culture ID Panel (Reflexed)     Status: Abnormal   Collection Time: 03/06/19  6:54 PM  Result Value Ref Range Status   Enterococcus species NOT DETECTED NOT DETECTED Final   Listeria monocytogenes NOT DETECTED NOT DETECTED Final   Staphylococcus species DETECTED (A) NOT DETECTED Final    Comment: Methicillin (oxacillin) resistant coagulase negative staphylococcus. Possible blood culture  contaminant (unless isolated from more than one blood culture draw or clinical case suggests pathogenicity). No antibiotic treatment is indicated for blood  culture contaminants. CRITICAL RESULT CALLED TO, READ BACK BY AND VERIFIED WITH: PHARMD T DANG [id number] MLM    Staphylococcus aureus (BCID) NOT DETECTED NOT DETECTED Final   Methicillin resistance DETECTED (A) NOT DETECTED Final    Comment: CRITICAL RESULT CALLED TO, READ BACK BY AND VERIFIED WITH: PHARMD T DANG [id number] MLM    Streptococcus species NOT DETECTED NOT DETECTED Final   Streptococcus agalactiae NOT DETECTED NOT DETECTED Final   Streptococcus pneumoniae NOT DETECTED NOT DETECTED Final   Streptococcus pyogenes NOT DETECTED NOT DETECTED Final   Acinetobacter baumannii NOT DETECTED NOT DETECTED Final   Enterobacteriaceae species NOT DETECTED NOT DETECTED Final   Enterobacter cloacae complex NOT DETECTED NOT DETECTED Final   Escherichia coli NOT DETECTED NOT DETECTED Final   Klebsiella oxytoca NOT DETECTED NOT DETECTED Final   Klebsiella pneumoniae NOT DETECTED NOT DETECTED Final   Proteus species NOT DETECTED NOT DETECTED Final   Serratia marcescens NOT DETECTED NOT DETECTED Final   Haemophilus influenzae NOT DETECTED NOT DETECTED Final   Neisseria meningitidis NOT DETECTED NOT DETECTED Final   Pseudomonas aeruginosa NOT DETECTED NOT DETECTED Final   Candida albicans NOT DETECTED NOT DETECTED Final   Candida glabrata NOT DETECTED NOT DETECTED Final   Candida krusei NOT DETECTED NOT DETECTED Final   Candida parapsilosis NOT DETECTED NOT DETECTED Final   Candida tropicalis NOT DETECTED NOT DETECTED Final    Comment: Performed at [facility], [address], [place],  [address]  Blood culture (routine x 2)     Status: None (Preliminary result)   Collection Time: 03/06/19  6:59 PM  Result Value Ref Range Status  Specimen Description BLOOD LEFT HAND  Final   Special Requests   Final    BOTTLES DRAWN  AEROBIC AND ANAEROBIC Blood Culture results may not be optimal due to an inadequate volume of blood received in culture bottles   Culture   Final    NO GROWTH 4 DAYS Performed at [facility], [place] [address], [place], [name] [address]    Report Status PENDING  Incomplete  Culture, blood (routine x 2)     Status: None (Preliminary result)   Collection Time: 03/07/19  6:42 PM  Result Value Ref Range Status   Specimen Description BLOOD RIGHT HAND  Final   Special Requests   Final    BOTTLES DRAWN AEROBIC AND ANAEROBIC Blood Culture adequate volume   Culture   Final    NO GROWTH 3 DAYS Performed at [facility], [address] [place], [place] [address]    Report Status PENDING  Incomplete  Culture, blood (routine x 2)     Status: None (Preliminary result)   Collection Time: 03/07/19  6:42 PM  Result Value Ref Range Status   Specimen Description BLOOD LEFT HAND  Final   Special Requests   Final    BOTTLES DRAWN AEROBIC ONLY Blood Culture results may not be optimal due to an inadequate volume of blood received in culture bottles   Culture   Final    NO GROWTH 3 DAYS Performed at [facility], [place] [address], [place], [place] [address]    Report Status PENDING  Incomplete    Radiology Reports Dg Chest 2 View  Result Date: 03/06/2019 CLINICAL DATA:  Shortness of breath EXAM: CHEST - 2 VIEW COMPARISON:  01/11/2011 FINDINGS: Bilateral interstitial and alveolar airspace opacities. Small left pleural effusion. No pneumothorax. Stable cardiomegaly. No acute osseous abnormality. IMPRESSION: Bilateral interstitial and alveolar airspace opacities with stable cardiomegaly. Differential considerations include pulmonary edema versus multilobar pneumonia. Electronically Signed   By: Kathreen Devoid   On: 03/06/2019 19:07   Dg Chest Port 1 View  Result Date: 03/08/2019 CLINICAL DATA:  Increased short of breath EXAM: PORTABLE CHEST 1 VIEW COMPARISON:  03/06/2019, 01/11/2019 FINDINGS:  Cardiomegaly with vascular congestion and diffuse bilateral interstitial and ground-glass opacities suspicious for edema. Probable bilateral pleural effusions. Left basilar airspace consolidation. No pneumothorax. IMPRESSION: Cardiomegaly with worsening vascular congestion, pleural effusion, and interstitial and ground-glass opacities suspicious for edema. Left lung base consolidation could reflect atelectasis or pneumonia Electronically Signed   By: Donavan Foil M.D.   On: 03/08/2019 21:35   [place] Ekg Site Rite  Result Date: 03/09/2019 If Site Rite image not attached, placement could not be confirmed due to current cardiac rhythm.   Lab Data:  CBC: Recent Labs  Lab 03/06/19 1829  03/07/19 0524 03/07/19 1842 03/08/19 0412 03/08/19 1846 03/09/19 0457 03/10/19 0539  WBC 10.3  --  10.4 8.8 9.0  --  10.5 9.0  NEUTROABS 8.1*  --   --  6.1  --   --  8.0* 8.6*  HGB 6.7*   < > 7.0* 7.5* 6.9* 8.1* 8.0* 8.0*  HCT 24.7*   < > 24.3* 26.0* 23.3* 27.9* 28.0* 28.0*  MCV 86.7  --  83.2 83.3 82.3  --  84.3 86.4  PLT 625*  --  582* 603* 549*  --  548* 501*   < > = values in this interval not displayed.   Basic Metabolic Panel: Recent Labs  Lab 03/06/19 1829 03/06/19 1839 03/07/19 7858  03/08/19 0412 03/09/19 0457 03/10/19 0539  NA 136 138 141 140 140 141  K 4.7 4.7 4.5 3.7 2.8* 3.2*  CL 95*  --  99 92* 91* 89*  CO2 29  --  35* 35* 39* 39*  GLUCOSE 97  --  113* 114* 121* 189*  BUN 18  --  19 27* 28* 36*  CREATININE 1.12  --  1.19 1.62* 1.71* 1.79*  CALCIUM 8.6*  --  8.3* 8.3* 8.2* 8.6*  MG  --   --   --  2.3  --   --    GFR: Estimated Creatinine Clearance: 51.8 mL/min (A) (by C-G formula based on SCr of 1.79 mg/dL (H)). Liver Function Tests: Recent Labs  Lab 03/06/19 1829  AST 16  ALT 12  ALKPHOS 120  BILITOT 0.4  PROT 7.4  ALBUMIN 3.2*   No results for input(s): LIPASE, AMYLASE in the last 168 hours. Recent Labs  Lab 03/06/19 1854  AMMONIA 24   Coagulation Profile: No  results for input(s): INR, PROTIME in the last 168 hours. Cardiac Enzymes: Recent Labs  Lab 03/06/19 1829 03/06/19 2103  TROPONINI 0.05* 0.04*   BNP (last 3 results) No results for input(s): PROBNP in the last 8760 hours. HbA1C: No results for input(s): HGBA1C in the last 72 hours. CBG: Recent Labs  Lab 03/09/19 0607 03/09/19 1155 03/09/19 1628 03/09/19 2056 03/10/19 0610  GLUCAP 113* 182* 230* 342* 172*   Lipid Profile: Recent Labs    03/08/19 0412  CHOL 83  HDL 43  LDLCALC 18  TRIG 109  CHOLHDL 1.9   Thyroid Function Tests: No results for input(s): TSH, T4TOTAL, FREET4, T3FREE, THYROIDAB in the last 72 hours. Anemia Panel: No results for input(s): VITAMINB12, FOLATE, FERRITIN, TIBC, IRON, RETICCTPCT in the last 72 hours. Urine analysis:    Component Value Date/Time   COLORURINE COLORLESS (A) 03/06/2019 [place] 03/06/2019 1829   LABSPEC 1.006 03/06/2019 [place] 5.0 03/06/2019 1829   GLUCOSEU NEGATIVE 03/06/2019 1829   HGBUR NEGATIVE 03/06/2019 1829   BILIRUBINUR NEGATIVE 03/06/2019 1829   KETONESUR NEGATIVE 03/06/2019 1829   PROTEINUR NEGATIVE 03/06/2019 1829   UROBILINOGEN 0.2 10/26/2014 2206   NITRITE NEGATIVE 03/06/2019 1829   LEUKOCYTESUR NEGATIVE 03/06/2019 1829     Jlyn Cerros M.D. [facility] 03/10/2019, 11:10 AM  Pager: [phone] Between 7am to 7pm - call Pager - [phone]  After 7pm go to [email or web address] - password [email or web address]  Call night coverage person covering after 7pm

## 2019-03-10 NOTE — Progress Notes (Signed)
Patient ID: Nicholas Caldwell, male   DOB: July 30, 1946, 73 y.o.   MRN: [id number]     Advanced Heart Failure Rounding Note  PCP-Cardiologist: No primary care provider on file.   Subjective:    Still feels weak, says he gets like this in atrial fibrillation.  Has not been out of bed.   Co-ox 76% yesterday, CVP 21 today.   CXR 5/5: Pulmonary edema, left base consolidation.  Afebrile.  He had 1 blood culture positive for suspected coagulase negative Staph (contaminant). Steroids started 5/6.   He has had 2 units PRBCs, hgb stable at 8 today.  Remains off Xarelto.  He remains in atypical atrial flutter, rate 80s-100s.     Objective:   Weight Range: 122.2 kg Body mass index is 36.54 kg/m.   Vital Signs:   Temp:  [97.5 F (36.4 C)-98.6 F (37 C)] 97.6 F (36.4 C) (05/07 0745) Pulse Rate:  [80-81] 80 (05/07 0425) Resp:  [23-24] 23 (05/07 0425) BP: (107-133)/(57-67) 125/60 (05/07 0745) SpO2:  [90 %-95 %] 95 % (05/07 0754) Weight:  [122.2 kg] 122.2 kg (05/07 0425) Last BM Date: 03/09/19  Weight change: Filed Weights   03/09/19 0227 03/09/19 0958 03/10/19 0425  Weight: 56.8 kg 122.2 kg 122.2 kg    Intake/Output:   Intake/Output Summary (Last 24 hours) at 03/10/2019 0917 Last data filed at 03/10/2019 0749 Gross per 24 hour  Intake 720 ml  Output 4525 ml  Net -3805 ml      Physical Exam    General: NAD Neck: JVP 16+ cm, no thyromegaly or thyroid nodule.  Lungs: Wheezes, improved from yesterday.  CV: Nondisplaced PMI.  Heart irregular S1/S2, no S3/S4, no murmur.  1+ edema to knees bilaterally.  Abdomen: Soft, nontender, no hepatosplenomegaly, no distention.  Skin: Intact without lesions or rashes.  Neurologic: Alert and oriented x 3.  Psych: Normal affect. Extremities: No clubbing or cyanosis.  HEENT: Normal.    Telemetry   Atrial flutter rate in 80s-100s (personally reviewed).   Labs    CBC Recent Labs    03/09/19 0457 03/10/19 0539  WBC 10.5 9.0  NEUTROABS  8.0* 8.6*  HGB 8.0* 8.0*  HCT 28.0* 28.0*  MCV 84.3 86.4  PLT 548* 263*   Basic Metabolic Panel Recent Labs    03/08/19 0412 03/09/19 0457 03/10/19 0539  NA 140 140 141  K 3.7 2.8* 3.2*  CL 92* 91* 89*  CO2 35* 39* 39*  GLUCOSE 114* 121* 189*  BUN 27* 28* 36*  CREATININE 1.62* 1.71* 1.79*  CALCIUM 8.3* 8.2* 8.6*  MG 2.3  --   --    Liver Function Tests No results for input(s): AST, ALT, ALKPHOS, BILITOT, PROT, ALBUMIN in the last 72 hours. No results for input(s): LIPASE, AMYLASE in the last 72 hours. Cardiac Enzymes No results for input(s): CKTOTAL, CKMB, CKMBINDEX, TROPONINI in the last 72 hours.  BNP: BNP (last 3 results) Recent Labs    12/01/18 0015 12/30/18 1621 03/06/19 1829  BNP 1,666.7* 1,273.0* 1,020.1*    ProBNP (last 3 results) No results for input(s): PROBNP in the last 8760 hours.   D-Dimer No results for input(s): DDIMER in the last 72 hours. Hemoglobin A1C No results for input(s): HGBA1C in the last 72 hours. Fasting Lipid Panel Recent Labs    03/08/19 0412  CHOL 83  HDL 43  LDLCALC 18  TRIG 109  CHOLHDL 1.9   Thyroid Function Tests No results for input(s): TSH, T4TOTAL, T3FREE, THYROIDAB in the last  72 hours.  Invalid input(s): FREET3  Other results:   Imaging    No results found.   Medications:     Scheduled Medications: . sodium chloride   Intravenous Once  . arformoterol  15 mcg Nebulization BID  . bisoprolol  2.5 mg Oral Daily  . budesonide (PULMICORT) nebulizer solution  0.25 mg Nebulization BID  . docusate sodium  100 mg Oral Daily  . ferrous sulfate  325 mg Oral BID WC  . folic acid  1 mg Oral Daily  . gabapentin  600 mg Oral BID  . insulin aspart  0-15 Units Subcutaneous TID WC  . insulin NPH Human  10 Units Subcutaneous BID AC & HS  . ipratropium-albuterol  3 mL Nebulization TID  . isosorbide-hydrALAZINE  1 tablet Oral TID  . latanoprost  1 drop Both Eyes QHS  . mouth rinse  15 mL Mouth Rinse BID  .  methylPREDNISolone (SOLU-MEDROL) injection  60 mg Intravenous Q12H  . metolazone  5 mg Oral Once  . nicotine  7 mg Transdermal Daily  . pantoprazole  40 mg Oral BID  . polyethylene glycol  17 g Oral Daily  . potassium chloride  40 mEq Oral Daily  . potassium chloride  40 mEq Oral Once  . rosuvastatin  20 mg Oral q1800  . sodium chloride flush  10-40 mL Intracatheter Q12H  . sodium chloride flush  3 mL Intravenous Q12H  . spironolactone  25 mg Oral Daily    Infusions: . sodium chloride    . furosemide (LASIX) infusion 15 mg/hr (03/10/19 0611)    PRN Medications: sodium chloride, acetaminophen, fluticasone, guaiFENesin, ondansetron (ZOFRAN) IV, oxyCODONE-acetaminophen, sodium chloride flush, sodium chloride flush   Assessment/Plan   1. Acute on chronic systolic CHF: Nonischemic cardiomyopathy.  With prominent RV failure, likely related to COPD/OSA/OHS. TEE in 3/20 with EF 40-45%, severely dilated RV with moderately decreased systolic function. He has had multiple recent admissions with CHF and COPD exacerbations.  He has not cooperated with paramedicine or [facility] home health as outpatient, has not been using CPAP per his wife, suspect significant medication noncompliance as well though he denies.  He remains markedly volume overloaded on exam today.  He diuresed well yesterday, creatinine mildly higher at 1.79.  CVP 21.  Co-ox yesterday was 76%.  - Continue Lasix 15 mg/hr and will give 5 mg po metolazone x 1 again.  He will need aggressive K repletion.  - Continue bisoprolol 2.5 daily.  - Continue Bidil 1 tab tid.  - Can increase spironolactone to 25 mg daily.    - Recheck co-ox this morning.  May need milrinone for RV support while diuresing (has RV > LV failure I think). - Cardiomems may be helpful as outpatient.  2. Anemia: Baseline hgb around 8.  6.7 at admission, denies overt GI bleeding.  He has had 2 unit PRBCs and IV Fe.  Hgb 8 today.   - Hold Xarelto for now, ?restart Eliquis  instead given history of GI bleeding.  - GI has seen, will need eventual scope when volume status improved.  Ideally, would have him on anticoagulation to allow TEE-guided DCCV to get him out of atrial flutter but need GI workup first. If he diureses well again today, hopefully can have GI evaluation tomorrow.  3. Atrial fibrillation/flutter: Patient has history of paroxysmal atrial fibrillation.  This admission, he appears to be in an atypical atrial flutter.  Rate is currently controlled. Xarelto on hold with GI bleeding.  Was  seen in past by Dr. Rayann Heman, not thought to be good ablation candidate.  He has not been on Tikosyn or amiodarone due to history of long QT.  - Needs GI workup for source of anemia => hopefully tomorrow.  When he is back on anticoagulation (would consider switch from Xarelto to Eliquis with GI bleeding), would plan TEE-guided DCCV.  - Will eventually ask EP to reconsider ablation given lack of good anti-arrhythmic options. He seems to do better in NSR.  4. COPD: Severe, on 3L home oxygen.  He has diffuse wheezing on exam.  This may be due to pulmonary edema, but also consider component of COPD exacerbation.  - Getting Duonebs.  - He is now on Solumedrol 60 mg IV q12.   5. OHS/OSA: On home oxygen.  Per wife, has not been using CPAP at home.  Will give CPAP at night here.  6. ID: Suspect this is primarily a CHF exacerbation.  Afebrile and WBCs not significantly elevated.  1 blood culture positive for what appears to be coagulase negative Staph (probably contaminant).  COVID-19 negative.  7. CAD: Nonobstructive on prior cath.  He is on Crestor.    Length of Stay: Hunter, MD  03/10/2019, 9:17 AM  Advanced Heart Failure Team Pager [phone] (M-F; 7a - 4p)  Please contact [facility] for night-coverage after hours (4p -7a ) and weekends on [email or web address]

## 2019-03-11 ENCOUNTER — Inpatient Hospital Stay (HOSPITAL_COMMUNITY): Payer: Medicare Other | Admitting: Anesthesiology

## 2019-03-11 ENCOUNTER — Encounter (HOSPITAL_COMMUNITY)
Admission: EM | Disposition: A | Discharge status: Home Health | Payer: Self-pay | Source: Home / Self Care | Attending: Internal Medicine

## 2019-03-11 ENCOUNTER — Encounter (HOSPITAL_COMMUNITY): Payer: Self-pay

## 2019-03-11 DIAGNOSIS — K259 Gastric ulcer, unspecified as acute or chronic, without hemorrhage or perforation: Secondary | ICD-10-CM

## 2019-03-11 DIAGNOSIS — K2970 Gastritis, unspecified, without bleeding: Secondary | ICD-10-CM

## 2019-03-11 HISTORY — PX: BIOPSY: SHX5522

## 2019-03-11 HISTORY — PX: COLONOSCOPY: SHX5424

## 2019-03-11 HISTORY — PX: ESOPHAGOGASTRODUODENOSCOPY: SHX5428

## 2019-03-11 LAB — CULTURE, BLOOD (ROUTINE X 2): Culture: NO GROWTH

## 2019-03-11 LAB — BASIC METABOLIC PANEL
Anion gap: 17 — ABNORMAL HIGH (ref 5–15)
Anion gap: 9 (ref 5–15)
BUN: 49 mg/dL — ABNORMAL HIGH (ref 8–23)
BUN: 43 mg/dL — ABNORMAL HIGH (ref 8–23)
CO2: 41 mmol/L — ABNORMAL HIGH (ref 22–32)
CO2: 34 mmol/L — ABNORMAL HIGH (ref 22–32)
Calcium: 8.7 mg/dL — ABNORMAL LOW (ref 8.9–10.3)
Calcium: 7.2 mg/dL — ABNORMAL LOW (ref 8.9–10.3)
Chloride: 82 mmol/L — ABNORMAL LOW (ref 98–111)
Chloride: 92 mmol/L — ABNORMAL LOW (ref 98–111)
Creatinine, Ser: 1.73 mg/dL — ABNORMAL HIGH (ref 0.61–1.24)
Creatinine, Ser: 1.56 mg/dL — ABNORMAL HIGH (ref 0.61–1.24)
GFR calc Af Amer: 45 mL/min — ABNORMAL LOW (ref >60)
GFR calc Af Amer: 51 mL/min — ABNORMAL LOW (ref >60)
GFR calc non Af Amer: 39 mL/min — ABNORMAL LOW (ref >60)
GFR calc non Af Amer: 44 mL/min — ABNORMAL LOW (ref >60)
Glucose, Bld: 293 mg/dL — ABNORMAL HIGH (ref 70–99)
Glucose, Bld: 377 mg/dL — ABNORMAL HIGH (ref 70–99)
Potassium: 2.6 mmol/L — CL (ref 3.5–5.1)
Potassium: 5.1 mmol/L (ref 3.5–5.1)
Sodium: 140 mmol/L (ref 135–145)
Sodium: 135 mmol/L (ref 135–145)

## 2019-03-11 LAB — COOXEMETRY PANEL
Carboxyhemoglobin: 1.6 % — ABNORMAL HIGH (ref 0.5–1.5)
Methemoglobin: 1.9 % — ABNORMAL HIGH (ref 0.0–1.5)
O2 Saturation: 63.1 %
Total hemoglobin: 8.4 g/dL — ABNORMAL LOW (ref 12.0–16.0)

## 2019-03-11 LAB — GLUCOSE, CAPILLARY
Glucose-Capillary: 245 mg/dL — ABNORMAL HIGH (ref 70–99)
Glucose-Capillary: 154 mg/dL — ABNORMAL HIGH (ref 70–99)
Glucose-Capillary: 231 mg/dL — ABNORMAL HIGH (ref 70–99)
Glucose-Capillary: 409 mg/dL — ABNORMAL HIGH (ref 70–99)
Glucose-Capillary: 403 mg/dL — ABNORMAL HIGH (ref 70–99)

## 2019-03-11 LAB — CBC WITH DIFFERENTIAL/PLATELET
Abs Immature Granulocytes: 0.04 10*3/uL (ref 0.00–0.07)
Basophils Absolute: 0 10*3/uL (ref 0.0–0.1)
Basophils Relative: 0 %
Eosinophils Absolute: 0 10*3/uL (ref 0.0–0.5)
Eosinophils Relative: 0 %
HCT: 27.8 % — ABNORMAL LOW (ref 39.0–52.0)
Hemoglobin: 8.3 g/dL — ABNORMAL LOW (ref 13.0–17.0)
Immature Granulocytes: 0 %
Lymphocytes Relative: 4 %
Lymphs Abs: 0.4 10*3/uL — ABNORMAL LOW (ref 0.7–4.0)
MCH: 25.2 pg — ABNORMAL LOW (ref 26.0–34.0)
MCHC: 29.9 g/dL — ABNORMAL LOW (ref 30.0–36.0)
MCV: 84.5 fL (ref 80.0–100.0)
Monocytes Absolute: 0.3 10*3/uL (ref 0.1–1.0)
Monocytes Relative: 4 %
Neutro Abs: 8.5 10*3/uL — ABNORMAL HIGH (ref 1.7–7.7)
Neutrophils Relative %: 92 %
Platelets: 489 10*3/uL — ABNORMAL HIGH (ref 150–400)
RBC: 3.29 MIL/uL — ABNORMAL LOW (ref 4.22–5.81)
RDW: 22.4 % — ABNORMAL HIGH (ref 11.5–15.5)
WBC: 9.2 10*3/uL (ref 4.0–10.5)
nRBC: 2.2 % — ABNORMAL HIGH (ref 0.0–0.2)

## 2019-03-11 LAB — MAGNESIUM: Magnesium: 2.2 mg/dL (ref 1.7–2.4)

## 2019-03-11 LAB — GLUCOSE, RANDOM: Glucose, Bld: 398 mg/dL — ABNORMAL HIGH (ref 70–99)

## 2019-03-11 SURGERY — EGD (ESOPHAGOGASTRODUODENOSCOPY)
Anesthesia: Monitor Anesthesia Care

## 2019-03-11 MED ORDER — METOLAZONE 5 MG PO TABS
5.0000 mg | ORAL_TABLET | Freq: Once | ORAL | Status: AC
  Administered 2019-03-11: 5 mg via ORAL
  Filled 2019-03-11: qty 1

## 2019-03-11 MED ORDER — POTASSIUM CHLORIDE CRYS ER 20 MEQ PO TBCR: 40.0000 meq | EXTENDED_RELEASE_TABLET | Freq: Once | ORAL | Status: DC

## 2019-03-11 MED ORDER — PROPOFOL 10 MG/ML IV BOLUS
INTRAVENOUS | Status: DC | PRN
  Administered 2019-03-11: 20 mg via INTRAVENOUS

## 2019-03-11 MED ORDER — DOXYCYCLINE HYCLATE 100 MG PO TABS
100.0000 mg | ORAL_TABLET | Freq: Two times a day (BID) | ORAL | Status: DC
  Administered 2019-03-11 – 2019-03-17 (×13): 100 mg via ORAL
  Filled 2019-03-11 (×13): qty 1

## 2019-03-11 MED ORDER — POTASSIUM CHLORIDE 10 MEQ/100ML IV SOLN
10.0000 meq | INTRAVENOUS | Status: AC
  Administered 2019-03-11: 10 meq via INTRAVENOUS
  Filled 2019-03-11 (×2): qty 100

## 2019-03-11 MED ORDER — INSULIN ASPART 100 UNIT/ML ~~LOC~~ SOLN
0.0000 [IU] | Freq: Every day | SUBCUTANEOUS | Status: DC
  Administered 2019-03-13: 5 [IU] via SUBCUTANEOUS
  Administered 2019-03-14: 2 [IU] via SUBCUTANEOUS
  Administered 2019-03-15 – 2019-03-16 (×2): 3 [IU] via SUBCUTANEOUS
  Administered 2019-03-17 – 2019-03-21 (×2): 2 [IU] via SUBCUTANEOUS

## 2019-03-11 MED ORDER — POTASSIUM CHLORIDE 10 MEQ/100ML IV SOLN
10.0000 meq | INTRAVENOUS | Status: AC
  Administered 2019-03-11 (×2): 10 meq via INTRAVENOUS
  Filled 2019-03-11 (×2): qty 100

## 2019-03-11 MED ORDER — INSULIN ASPART 100 UNIT/ML ~~LOC~~ SOLN
0.0000 [IU] | Freq: Three times a day (TID) | SUBCUTANEOUS | Status: DC
  Administered 2019-03-12 (×2): 5 [IU] via SUBCUTANEOUS
  Administered 2019-03-12: 8 [IU] via SUBCUTANEOUS
  Administered 2019-03-13: 11 [IU] via SUBCUTANEOUS
  Administered 2019-03-13: 8 [IU] via SUBCUTANEOUS
  Administered 2019-03-13: 15 [IU] via SUBCUTANEOUS
  Administered 2019-03-14: 11 [IU] via SUBCUTANEOUS
  Administered 2019-03-14 – 2019-03-15 (×3): 3 [IU] via SUBCUTANEOUS
  Administered 2019-03-15: 15 [IU] via SUBCUTANEOUS
  Administered 2019-03-15: 3 [IU] via SUBCUTANEOUS

## 2019-03-11 MED ORDER — POTASSIUM CHLORIDE 10 MEQ/100ML IV SOLN
INTRAVENOUS | Status: DC | PRN
  Administered 2019-03-11: 10 meq via INTRAVENOUS

## 2019-03-11 MED ORDER — INSULIN ASPART 100 UNIT/ML ~~LOC~~ SOLN
10.0000 [IU] | Freq: Once | SUBCUTANEOUS | Status: AC
  Administered 2019-03-11: 10 [IU] via SUBCUTANEOUS

## 2019-03-11 MED ORDER — INSULIN NPH (HUMAN) (ISOPHANE) 100 UNIT/ML ~~LOC~~ SUSP
15.0000 [IU] | Freq: Two times a day (BID) | SUBCUTANEOUS | Status: DC
  Administered 2019-03-11 – 2019-03-12 (×3): 15 [IU] via SUBCUTANEOUS
  Filled 2019-03-11: qty 10

## 2019-03-11 MED ORDER — PROPOFOL 500 MG/50ML IV EMUL
INTRAVENOUS | Status: DC | PRN
  Administered 2019-03-11: 75 ug/kg/min via INTRAVENOUS

## 2019-03-11 NOTE — Anesthesia Procedure Notes (Signed)
Procedure Name: MAC Date/Time: 03/11/2019 1:38 PM Performed by: Renato Shin, CRNA Pre-anesthesia Checklist: Patient identified, Emergency Drugs available, Suction available and Patient being monitored Patient Re-evaluated:Patient Re-evaluated prior to induction Oxygen Delivery Method: Nasal cannula Preoxygenation: Pre-oxygenation with 100% oxygen Induction Type: IV induction Placement Confirmation: positive ETCO2 and breath sounds checked- equal and bilateral Dental Injury: Teeth and Oropharynx as per pre-operative assessment

## 2019-03-11 NOTE — Anesthesia Preprocedure Evaluation (Addendum)
Anesthesia Evaluation  Patient identified by MRN, date of birth, ID band Patient awake    Reviewed: Allergy & Precautions, H&P , NPO status , Patient's Chart, lab work & pertinent test results, reviewed documented beta blocker date and time   Airway Mallampati: II  TM Distance: >3 FB Neck ROM: full    Dental no notable dental hx. (+) Edentulous Upper, Edentulous Lower   Pulmonary shortness of breath, COPD, former smoker,    Pulmonary exam normal breath sounds clear to auscultation       Cardiovascular Exercise Tolerance: Good hypertension, + CAD and +CHF   Rhythm:regular Rate:Normal  Last echo 07/01/16: mod dilated LV, mod LVH, EF 25-30%, mild-mod MR, sev LAE, mild-mod reduced RV systolic function, mild-mod TR, PASP 27mHG.   Neuro/Psych negative neurological ROS  negative psych ROS   GI/Hepatic negative GI ROS, Neg liver ROS,   Endo/Other  negative endocrine ROSdiabetes, Type 2  Renal/GU negative Renal ROS  negative genitourinary   Musculoskeletal   Abdominal   Peds  Hematology negative hematology ROS (+)   Anesthesia Other Findings   Reproductive/Obstetrics negative OB ROS                                                             Anesthesia Evaluation  Patient identified by MRN, date of birth, ID band Patient awake    Reviewed: Allergy & Precautions, NPO status , Patient's Chart, lab work & pertinent test results  Airway Mallampati: II  TM Distance: >3 FB Neck ROM: Full    Dental no notable dental hx. (+) Edentulous Upper, Edentulous Lower   Pulmonary sleep apnea , COPD, Current Smoker,    Pulmonary exam normal breath sounds clear to auscultation       Cardiovascular Exercise Tolerance: Good hypertension, Pt. on medications and Pt. on home beta blockers + CAD, +CHF and + DOE  negative cardio ROS Normal cardiovascular exam Rhythm:Regular Rate:Normal  Echo  04/07/2018 Left ventricle: Diffuse hypokinesis worse in the inferior wall   with abnormal septal motion The cavity size was mildly dilated.   Wall thickness was normal. Systolic function was mildly to   moderately reduced. The estimated ejection fraction was in the   range of 40% to 45%. Left ventricular diastolic function   parameters were normal.   Neuro/Psych  Neuromuscular disease negative psych ROS   GI/Hepatic negative GI ROS, GERD  ,  Endo/Other  diabetes, Type 2  Renal/GU      Musculoskeletal   Abdominal (+) + obese,   Peds  Hematology  (+) anemia ,   Anesthesia Other Findings   Reproductive/Obstetrics                            Anesthesia Physical Anesthesia Plan  ASA: III  Anesthesia Plan: General   Post-op Pain Management:    Induction:   PONV Risk Score and Plan: Treatment may vary due to age or medical condition  Airway Management Planned: Natural Airway and Nasal Cannula  Additional Equipment:   Intra-op Plan:   Post-operative Plan:   Informed Consent: I have reviewed the patients History and Physical, chart, labs and discussed the procedure including the risks, benefits and alternatives for the proposed anesthesia with the patient or authorized representative who  has indicated his/her understanding and acceptance.   Dental advisory given  Plan Discussed with:   Anesthesia Plan Comments:         Anesthesia Quick Evaluation                                   Anesthesia Evaluation  Patient identified by MRN, date of birth, ID band Patient awake    Reviewed: Allergy & Precautions, NPO status , Patient's Chart, lab work & pertinent test results  Airway Mallampati: II  TM Distance: >3 FB Neck ROM: Full    Dental no notable dental hx. (+) Edentulous Upper, Edentulous Lower   Pulmonary sleep apnea , COPD, Current Smoker,    Pulmonary exam normal breath sounds clear to auscultation        Cardiovascular Exercise Tolerance: Good hypertension, Pt. on medications and Pt. on home beta blockers + CAD, +CHF and + DOE  negative cardio ROS Normal cardiovascular exam Rhythm:Regular Rate:Normal  Echo 04/07/2018 Left ventricle: Diffuse hypokinesis worse in the inferior wall   with abnormal septal motion The cavity size was mildly dilated.   Wall thickness was normal. Systolic function was mildly to   moderately reduced. The estimated ejection fraction was in the   range of 40% to 45%. Left ventricular diastolic function   parameters were normal.   Neuro/Psych  Neuromuscular disease negative psych ROS   GI/Hepatic negative GI ROS, GERD  ,  Endo/Other  diabetes, Type 2  Renal/GU      Musculoskeletal   Abdominal (+) + obese,   Peds  Hematology  (+) anemia ,   Anesthesia Other Findings   Reproductive/Obstetrics                            Anesthesia Physical Anesthesia Plan  ASA: III  Anesthesia Plan: General   Post-op Pain Management:    Induction:   PONV Risk Score and Plan: Treatment may vary due to age or medical condition  Airway Management Planned: Natural Airway and Nasal Cannula  Additional Equipment:   Intra-op Plan:   Post-operative Plan:   Informed Consent: I have reviewed the patients History and Physical, chart, labs and discussed the procedure including the risks, benefits and alternatives for the proposed anesthesia with the patient or authorized representative who has indicated his/her understanding and acceptance.   Dental advisory given  Plan Discussed with:   Anesthesia Plan Comments:         Anesthesia Quick Evaluation  Anesthesia Physical Anesthesia Plan  ASA: IV  Anesthesia Plan: General   Post-op Pain Management:    Induction:   PONV Risk Score and Plan: 2 and Ondansetron and Treatment may vary due to age or medical condition  Airway Management Planned: Oral ETT and LMA  Additional  Equipment:   Intra-op Plan:   Post-operative Plan:   Informed Consent: I have reviewed the patients History and Physical, chart, labs and discussed the procedure including the risks, benefits and alternatives for the proposed anesthesia with the patient or authorized representative who has indicated his/her understanding and acceptance.     Dental Advisory Given  Plan Discussed with: CRNA, Anesthesiologist and Surgeon  Anesthesia Plan Comments:         Anesthesia Quick Evaluation

## 2019-03-11 NOTE — Op Note (Signed)
[facility] Patient Name: Nicholas Caldwell Procedure Date : 03/11/2019 MRN: [id number] Attending MD: Gerrit Heck , MD Date of Birth: [birth year] CSN: [id number] Age: 73 Admit Type: Inpatient Procedure:                Colonoscopy Indications:              Iron deficiency anemia Providers:                Gerrit Heck, MD, Grace Isaac, RN, Raynelle Bring, RN, Ladona Ridgel, Technician, Elspeth Cho Tech., Technician, Luane School, CRNA Referring MD:              Medicines:                Monitored Anesthesia Care Complications:            No immediate complications. Estimated Blood Loss:     Estimated blood loss: none. Procedure:                Pre-Anesthesia Assessment:                           - Prior to the procedure, a History and Physical                            was performed, and patient medications and                            allergies were reviewed. The patient's tolerance of                            previous anesthesia was also reviewed. The risks                            and benefits of the procedure and the sedation                            options and risks were discussed with the patient.                            All questions were answered, and informed consent                            was obtained. Prior Anticoagulants: The patient has                            taken no previous anticoagulant or antiplatelet                            agents. ASA Grade Assessment: III - A patient with  severe systemic disease. After reviewing the risks                            and benefits, the patient was deemed in                            satisfactory condition to undergo the procedure.                           After obtaining informed consent, the colonoscope                            was passed under direct vision. Throughout the   procedure, the patient's blood pressure, pulse, and                            oxygen saturations were monitored continuously. The                            CF-HQ190L ([id number] Olympus colonoscope was                            introduced through the anus and advanced to the the                            cecum, identified by appendiceal orifice and                            ileocecal valve. The colonoscopy was performed                            without difficulty. The patient tolerated the                            procedure well. The quality of the bowel                            preparation was poor. The ileocecal valve,                            appendiceal orifice, and rectum were photographed. Scope In: 1:47:44 PM Scope Out: 1:54:40 PM Scope Withdrawal Time: 0 hours 3 minutes 19 seconds  Total Procedure Duration: 0 hours 6 minutes 56 seconds  Findings:      The perianal and digital rectal examinations were normal.      A few small-mouthed diverticula were found in the ascending colon.      A large amount of semi-solid stool was found in the entire colon,       interfering with visualization. Lavage of the area was performed using       copious amounts of sterile water, resulting in clearance with fair       visualization.      The visualized mucosa was otherwise normal appearing throughout the       colon. There were no areas of bleeding or stigmata of recent bleeding  noted. No hematin.      Non-bleeding internal hemorrhoids were found during retroflexion. The       hemorrhoids were small. Impression:               - Preparation of the colon was poor.                           - Diverticulosis in the ascending colon.                           - Stool in the entire examined colon.                           - The visualized mucosa was otherwise normal                            appearing throughout the colon. There were no areas                            of bleeding  or stigmata of recent bleeding noted.                           - Non-bleeding internal hemorrhoids.                           - No specimens collected. Recommendation:           - Return patient to hospital ward for ongoing care.                           - Full liquid diet today.                           - Continue present medications. Procedure Code(s):        --- Professional ---                           [phone], Colonoscopy, flexible; diagnostic, including                            collection of specimen(s) by brushing or washing,                            when performed (separate procedure) Diagnosis Code(s):        --- Professional ---                           K64.8, Other hemorrhoids                           D50.9, Iron deficiency anemia, unspecified                           K57.30, Diverticulosis of large intestine without                            perforation or abscess without  bleeding CPT copyright 2019 [employer]. All rights reserved. The codes documented in this report are preliminary and upon coder review may  be revised to meet current compliance requirements. Gerrit Heck, MD 03/11/2019 2:39:29 PM Number of Addenda: 0

## 2019-03-11 NOTE — Progress Notes (Signed)
Contacted on call for [facility] regarding pts continued elevated glucose levels. Awaiting orders for novolog.

## 2019-03-11 NOTE — Progress Notes (Signed)
Physical Therapy Treatment Patient Details Name: Nicholas Caldwell MRN: [id number] DOB: 1946/06/27 Today's Date: 03/11/2019    History of Present Illness 73 yo male admitted with acute on chronic heart failure and respiratory failure. PMHx: CHF, chronic anemia, HLD, obesity, CAD, NICM, polyneuropathy, DM, COPD    PT Comments    Agreeable to participate with a little encouragement.  Initially balking at moving away from the bed, but again with encouragement ambulated with RW much further than expected, even with a painful R hip.    Follow Up Recommendations  Home health PT;Supervision/Assistance - 24 hour     Equipment Recommendations  None recommended by PT    Recommendations for Other Services       Precautions / Restrictions Precautions Precautions: Fall    Mobility  Bed Mobility Overal bed mobility: Needs Assistance Bed Mobility: Supine to Sit     Supine to sit: Min assist     General bed mobility comments: Reaching for assist, but cued to use his UE's to assist and then only needed minimal truncal assist  Transfers Overall transfer level: Needs assistance Equipment used: Rolling walker (2 wheeled) Transfers: Sit to/from Stand Sit to Stand: Min assist;+2 safety/equipment         General transfer comment: cues for hand placement, assist to come forward and up.  Ambulation/Gait Ambulation/Gait assistance: Min assist;+2 safety/equipment(for chair follow) Gait Distance (Feet): 80 Feet Assistive device: Rolling walker (2 wheeled) Gait Pattern/deviations: Step-through pattern;Decreased stride length Gait velocity: slower   General Gait Details: mildly antalgic on the right, but more steady than suspected given he does not frequently walk.  Cues given for upright posture which pt was able to attain.   Stairs             Wheelchair Mobility    Modified Rankin (Stroke Patients Only)       Balance Overall balance assessment: Needs  assistance Sitting-balance support: No upper extremity supported Sitting balance-Leahy Scale: Fair     Standing balance support: Bilateral upper extremity supported;During functional activity Standing balance-Leahy Scale: Poor Standing balance comment: relies on UE support on RW.                              Cognition Arousal/Alertness: Awake/alert Behavior During Therapy: WFL for tasks assessed/performed Overall Cognitive Status: Within Functional Limits for tasks assessed                                        Exercises      General Comments        Pertinent Vitals/Pain Pain Assessment: 0-10 Pain Score: 9  Pain Location: right hip during gait Pain Descriptors / Indicators: Sore;Sharp Pain Intervention(s): Monitored during session    Home Living                      Prior Function            PT Goals (current goals can now be found in the care plan section) Acute Rehab PT Goals Patient Stated Goal: to go home PT Goal Formulation: With patient Time For Goal Achievement: 03/24/19 Potential to Achieve Goals: Good Progress towards PT goals: Progressing toward goals    Frequency    Min 3X/week      PT Plan Current plan remains appropriate    Co-evaluation  AM-PAC PT "6 Clicks" Mobility   Outcome Measure  Help needed turning from your back to your side while in a flat bed without using bedrails?: None Help needed moving from lying on your back to sitting on the side of a flat bed without using bedrails?: A Little Help needed moving to and from a bed to a chair (including a wheelchair)?: A Little Help needed standing up from a chair using your arms (e.g., wheelchair or bedside chair)?: A Little Help needed to walk in hospital room?: A Little Help needed climbing 3-5 steps with a railing? : A Little 6 Click Score: 19    End of Session   Activity Tolerance: Patient tolerated treatment well;Patient  limited by pain Patient left: in chair;with call bell/phone within reach Nurse Communication: Mobility status PT Visit Diagnosis: Unsteadiness on feet (R26.81);Muscle weakness (generalized) (M62.81)     Time: 5462-7035 PT Time Calculation (min) (ACUTE ONLY): 21 min  Charges:  $Gait Training: 8-22 mins                     03/11/2019  [name], PT Acute Rehabilitation Services [phone]  (pager) [phone]  (office)   Tessie Fass Lexie Morini 03/11/2019, 5:17 PM

## 2019-03-11 NOTE — Progress Notes (Addendum)
[facility]                                                                              Patient Demographics  Nicholas Caldwell, is a 73 y.o. male, DOB - January 16, 1946, SNK:[id number]  Admit date - 03/06/2019   Admitting Physician Etta Quill, DO  Outpatient Primary MD for the patient is [facility]  Outpatient specialists:    LOS - 5  days   Medical records reviewed and are as summarized below:    Chief Complaint  Patient presents with  . Shortness of Breath       Brief summary   73 year old male with history of combined systolic and diastolic CHF with EF of 93-79% (per TEE in March), paroxysmal A. fib on Xarelto, COPD on 3 L home O2, noncompliance with inhalers with ongoing tobacco use, possibly diuretic, morbid obesity with OSA not adherent to CPAP presented with acute on chronic hypoxic respiratory failure secondary to acute on chronic combined systolic and diastolic CHF.  Patient had gross anasarca with pulmonary edema on chest x-ray and BNP of thousand.  Required nebs in the ED, higher oxygen and IV Lasix.  Admitted for aggressive IV diuresis.  Also found to have severe iron deficiency anemia.   Assessment & Plan    Principal Problem:   Acute on chronic respiratory failure with hypoxia and hypercapnia (HCC) -Likely secondary to acute on chronic combined systolic and diastolic CHF, COPD exacerbation -Slowly improving, O2 sats 94% on 4 L  Active Problems:  Acute on chronic combined systolic and diastolic CHF.  Underlying nonischemic cardiomyopathy,  RV failure secondary to COPD, OSA, OHS, medication noncompliance -Still volume overloaded, anasarca -TEE in 3/20 with EF 40 to 45% with severely dilated RV and moderately decreased systolic function. -CHF team following, continue Lasix drip  -Continue beta-blocker, BiDil, spironolactone, follow creatinine closely  -Strict I's and O's, negative balance of 6.6 L - weight on admission 265lbs->276->  today 263 lbs   Hypokalemia -Replace IV as patient is n.p.o., will recheck potassium later today. -Magnesium 2.2  Acute COPD exacerbation -Mild wheezing today during my encounter however had not received his morning nebs  -Currently on maximal treatment with duo nebs, Pulmicort, Brovana, IV steroids.  -New IV Solu-Medrol as patient is n.p.o., flutter valve, PPI Started on doxycycline  Severe iron deficiency anemia -Baseline hemoglobin around 8-9, hemoglobin 6.9 on 5/5, received 1 unit packed RBCs and Feraheme -Continue PPI.  Hemoglobin stable 8.3, plan for EGD and colonoscopy today Addendum 3:25 PM -Discussed in detail with GI, Dr. Bryan Lemma regarding EGD and colonoscopy results, gastric mucosa unfortunately concerning for malignancy.  Biopsies done, will await results Discussed with surgery, recommended to wait for biopsy before any management is planned, will also need oncology to weigh in prior to any surgical option.  Acute kidney injury -Creatinine stable at 1.7, monitor closely with Lasix and metolazone.   Essential hypertension Currently stable, continue bisoprolol, spironolactone  Type 2 diabetes mellitus with neuropathy -Continue Novolin twice daily and sliding scale insulin,  -CBGs elevated due to steroids -Increase Novolin to 15 units 2 times daily, once patient back on diet, will  transition to oral prednisone   Paroxysmal atrial fibrillation Rate controlled, Xarelto currently on hold due to severe iron deficiency anemia -Plan for EGD and colonoscopy today, then will need to place back on anticoagulation  NSVT -Keep potassium above 4, aggressively replace, magnesium normal Replacing potassium  Nicotine abuse Patient counseled on nicotine cessation, continue nicotine patch  Hyperlipidemia Continue statin   Code Status: Full code DVT Prophylaxis: SCD's, Xarelto on hold Family Communication: Discussed in detail with the patient, all imaging results, lab  results explained to the patient and patient's wife on the phone   Disposition Plan:  currently on Lasix drip, still very volume overloaded, needs GI evaluation.  High risk of decompensation.   Time Spent in minutes 25 minutes  Procedures:  None  Consultants:   [facility]  Antimicrobials:   Anti-infectives (From admission, onward)   Start     Dose/Rate Route Frequency Ordered Stop   03/11/19 1000  doxycycline (VIBRA-TABS) tablet 100 mg     100 mg Oral Every 12 hours 03/11/19 0733     03/06/19 1945  ceFEPIme (MAXIPIME) 2 g in sodium chloride 0.9 % 100 mL IVPB     2 g 200 mL/hr over 30 Minutes Intravenous  Once 03/06/19 1941 03/06/19 2046   03/06/19 1945  vancomycin (VANCOCIN) 2,000 mg in sodium chloride 0.9 % 500 mL IVPB     2,000 mg 250 mL/hr over 120 Minutes Intravenous  Once 03/06/19 1941 03/07/19 0015         Medications  Scheduled Meds: . sodium chloride   Intravenous Once  . arformoterol  15 mcg Nebulization BID  . bisoprolol  2.5 mg Oral Daily  . budesonide (PULMICORT) nebulizer solution  0.25 mg Nebulization BID  . docusate sodium  100 mg Oral Daily  . doxycycline  100 mg Oral Q12H  . ferrous sulfate  325 mg Oral BID WC  . folic acid  1 mg Oral Daily  . gabapentin  600 mg Oral BID  . insulin aspart  0-15 Units Subcutaneous TID WC  . insulin NPH Human  15 Units Subcutaneous BID AC & HS  . ipratropium-albuterol  3 mL Nebulization TID  . isosorbide-hydrALAZINE  1 tablet Oral TID  . latanoprost  1 drop Both Eyes QHS  . mouth rinse  15 mL Mouth Rinse BID  . methylPREDNISolone (SOLU-MEDROL) injection  40 mg Intravenous Q12H  . metolazone  5 mg Oral Once  . nicotine  7 mg Transdermal Daily  . pantoprazole  40 mg Oral BID  . peg 3350 powder  0.5 kit Oral Once  . polyethylene glycol  17 g Oral Daily  . potassium chloride  40 mEq Oral Daily  . rosuvastatin  20 mg Oral q1800  . sodium chloride flush  10-40 mL Intracatheter Q12H  . sodium chloride  flush  3 mL Intravenous Q12H  . spironolactone  25 mg Oral Daily   Continuous Infusions: . sodium chloride    . sodium chloride    . furosemide (LASIX) infusion 15 mg/hr (03/10/19 2563)  . potassium chloride 10 mEq (03/11/19 1050)   PRN Meds:.sodium chloride, acetaminophen, fluticasone, guaiFENesin, ondansetron (ZOFRAN) IV, oxyCODONE-acetaminophen, sodium chloride flush, sodium chloride flush      Subjective:   Nicholas Caldwell was seen and examined today.  Mild wheezing today, had not received his a.m. nebs.  Sats is stable on 4 L O2 however this is higher than his baseline of 2 L.  No chest pain or fevers.  Patient  denies dizziness, abdominal pain, N/V/D/C, new weakness, numbess, tingling. No acute events overnight.  Plan for Endo today  Objective:   Vitals:   03/11/19 0740 03/11/19 0800 03/11/19 0835 03/11/19 0850  BP: 140/63     Pulse: 79   83  Resp: (!) 22   20  Temp: 98.3 F (36.8 C)     TempSrc: Oral     SpO2: 92%  93% 95%  Weight:  119.4 kg    Height:        Intake/Output Summary (Last 24 hours) at 03/11/2019 1059 Last data filed at 03/11/2019 0817 Gross per 24 hour  Intake 2860 ml  Output 3450 ml  Net -590 ml     Wt Readings from Last 3 Encounters:  03/11/19 119.4 kg  01/24/19 115.8 kg  01/14/19 116.6 kg   Physical Exam  General: Alert and oriented x 3, NAD  Eyes: ,  HEENT:   Cardiovascular: S1 S2 clear,, RRR. 1+ pedal edema b/l  Respiratory: Bilateral expiratory wheezing, mild  Gastrointestinal: Soft, nontender, nondistended, NBS  Ext: 1+pedal edema bilaterally  Neuro: no new deficits  Musculoskeletal: No cyanosis, clubbing  Skin: No rashes  Psych: Normal affect and demeanor, alert and oriented x3      Data Reviewed:  I have personally reviewed following labs and imaging studies  Micro Results Recent Results (from the past 240 hour(s))  SARS Coronavirus 2 (CEPHEID- Performed in [place] hospital lab), Hosp Order     Status: None    Collection Time: 03/06/19  6:29 PM  Result Value Ref Range Status   SARS Coronavirus 2 NEGATIVE NEGATIVE Final    Comment: (NOTE) If result is NEGATIVE SARS-CoV-2 target nucleic acids are NOT DETECTED. The SARS-CoV-2 RNA is generally detectable in upper and lower  respiratory specimens during the acute phase of infection. The lowest  concentration of SARS-CoV-2 viral copies this assay can detect is 250  copies / mL. A negative result does not preclude SARS-CoV-2 infection  and should not be used as the sole basis for treatment or other  patient management decisions.  A negative result may occur with  improper specimen collection / handling, submission of specimen other  than nasopharyngeal swab, presence of viral mutation(s) within the  areas targeted by this assay, and inadequate number of viral copies  (<250 copies / mL). A negative result must be combined with clinical  observations, patient history, and epidemiological information. If result is POSITIVE SARS-CoV-2 target nucleic acids are DETECTED. The SARS-CoV-2 RNA is generally detectable in upper and lower  respiratory specimens dur ing the acute phase of infection.  Positive  results are indicative of active infection with SARS-CoV-2.  Clinical  correlation with patient history and other diagnostic information is  necessary to determine patient infection status.  Positive results do  not rule out bacterial infection or co-infection with other viruses. If result is PRESUMPTIVE POSTIVE SARS-CoV-2 nucleic acids MAY BE PRESENT.   A presumptive positive result was obtained on the submitted specimen  and confirmed on repeat testing.  While 2019 novel coronavirus  (SARS-CoV-2) nucleic acids may be present in the submitted sample  additional confirmatory testing may be necessary for epidemiological  and / or clinical management purposes  to differentiate between  SARS-CoV-2 and other Sarbecovirus currently known to infect humans.   If clinically indicated additional testing with an alternate test  methodology [phone]) is advised. The SARS-CoV-2 RNA is generally  detectable in upper and lower respiratory sp ecimens during the acute  phase of infection. The expected result is Negative. Fact Sheet for Patients:  StrictlyIdeas.no Fact Sheet for Healthcare Providers: BankingDealers.co.za This test is not yet approved or cleared by the [place] FDA and has been authorized for detection and/or diagnosis of SARS-CoV-2 by FDA under an Emergency Use Authorization (EUA).  This EUA will remain in effect (meaning this test can be used) for the duration of the COVID-19 declaration under Section 564(b)(1) of the Act, 21 U.S.C. section 360bbb-3(b)(1), unless the authorization is terminated or revoked sooner. Performed at [facility], [place] [address], [place], [place] [address]   Urine culture     Status: Abnormal   Collection Time: 03/06/19  6:29 PM  Result Value Ref Range Status   Specimen Description URINE, RANDOM  Final   Special Requests NONE  Final   Culture (A)  Final    <10,000 COLONIES/mL INSIGNIFICANT GROWTH Performed at [facility], [place] [address], [place], [place] [address]    Report Status 03/07/2019 FINAL  Final  Blood culture (routine x 2)     Status: Abnormal   Collection Time: 03/06/19  6:54 PM  Result Value Ref Range Status   Specimen Description BLOOD RIGHT ANTECUBITAL  Final   Special Requests   Final    BOTTLES DRAWN AEROBIC AND ANAEROBIC Blood Culture adequate volume   Culture  Setup Time   Final    GRAM POSITIVE COCCI IN BOTH AEROBIC AND ANAEROBIC BOTTLES CRITICAL RESULT CALLED TO, READ BACK BY AND VERIFIED WITH: PHARMD T DANG [id number] MLM    Culture (A)  Final    STAPHYLOCOCCUS SPECIES (COAGULASE NEGATIVE) THE SIGNIFICANCE OF ISOLATING THIS ORGANISM FROM A SINGLE SET OF BLOOD CULTURES WHEN MULTIPLE SETS ARE DRAWN IS UNCERTAIN.  PLEASE NOTIFY THE MICROBIOLOGY DEPARTMENT WITHIN ONE WEEK IF SPECIATION AND SENSITIVITIES ARE REQUIRED. Performed at [facility], [place] [address], [place], [place] [address]    Report Status 03/09/2019 FINAL  Final  Blood Culture ID Panel (Reflexed)     Status: Abnormal   Collection Time: 03/06/19  6:54 PM  Result Value Ref Range Status   Enterococcus species NOT DETECTED NOT DETECTED Final   Listeria monocytogenes NOT DETECTED NOT DETECTED Final   Staphylococcus species DETECTED (A) NOT DETECTED Final    Comment: Methicillin (oxacillin) resistant coagulase negative staphylococcus. Possible blood culture contaminant (unless isolated from more than one blood culture draw or clinical case suggests pathogenicity). No antibiotic treatment is indicated for blood  culture contaminants. CRITICAL RESULT CALLED TO, READ BACK BY AND VERIFIED WITH: PHARMD T DANG [id number] MLM    Staphylococcus aureus (BCID) NOT DETECTED NOT DETECTED Final   Methicillin resistance DETECTED (A) NOT DETECTED Final    Comment: CRITICAL RESULT CALLED TO, READ BACK BY AND VERIFIED WITH: PHARMD T DANG [id number] MLM    Streptococcus species NOT DETECTED NOT DETECTED Final   Streptococcus agalactiae NOT DETECTED NOT DETECTED Final   Streptococcus pneumoniae NOT DETECTED NOT DETECTED Final   Streptococcus pyogenes NOT DETECTED NOT DETECTED Final   Acinetobacter baumannii NOT DETECTED NOT DETECTED Final   Enterobacteriaceae species NOT DETECTED NOT DETECTED Final   Enterobacter cloacae complex NOT DETECTED NOT DETECTED Final   Escherichia coli NOT DETECTED NOT DETECTED Final   Klebsiella oxytoca NOT DETECTED NOT DETECTED Final   Klebsiella pneumoniae NOT DETECTED NOT DETECTED Final   Proteus species NOT DETECTED NOT DETECTED Final   Serratia marcescens NOT DETECTED NOT DETECTED Final   Haemophilus influenzae NOT  DETECTED NOT DETECTED Final   Neisseria meningitidis NOT DETECTED NOT DETECTED Final   Pseudomonas  aeruginosa NOT DETECTED NOT DETECTED Final   Candida albicans NOT DETECTED NOT DETECTED Final   Candida glabrata NOT DETECTED NOT DETECTED Final   Candida krusei NOT DETECTED NOT DETECTED Final   Candida parapsilosis NOT DETECTED NOT DETECTED Final   Candida tropicalis NOT DETECTED NOT DETECTED Final    Comment: Performed at [facility], [place] [address] [place], [place] [address]  Blood culture (routine x 2)     Status: None   Collection Time: 03/06/19  6:59 PM  Result Value Ref Range Status   Specimen Description BLOOD LEFT HAND  Final   Special Requests   Final    BOTTLES DRAWN AEROBIC AND ANAEROBIC Blood Culture results may not be optimal due to an inadequate volume of blood received in culture bottles   Culture   Final    NO GROWTH 5 DAYS Performed at [facility], [place] [address], [place], [place] [address]    Report Status 03/11/2019 FINAL  Final  Culture, blood (routine x 2)     Status: None (Preliminary result)   Collection Time: 03/07/19  6:42 PM  Result Value Ref Range Status   Specimen Description BLOOD RIGHT HAND  Final   Special Requests   Final    BOTTLES DRAWN AEROBIC AND ANAEROBIC Blood Culture adequate volume   Culture   Final    NO GROWTH 4 DAYS Performed at [facility], [place] [address] [place], [place] [address]    Report Status PENDING  Incomplete  Culture, blood (routine x 2)     Status: None (Preliminary result)   Collection Time: 03/07/19  6:42 PM  Result Value Ref Range Status   Specimen Description BLOOD LEFT HAND  Final   Special Requests   Final    BOTTLES DRAWN AEROBIC ONLY Blood Culture results may not be optimal due to an inadequate volume of blood received in culture bottles   Culture   Final    NO GROWTH 4 DAYS Performed at [facility], [place] [address], [place], [place] [address]    Report Status PENDING  Incomplete    Radiology Reports Dg Chest 2 View  Result Date: 03/06/2019 CLINICAL DATA:  Shortness of breath  EXAM: CHEST - 2 VIEW COMPARISON:  01/11/2011 FINDINGS: Bilateral interstitial and alveolar airspace opacities. Small left pleural effusion. No pneumothorax. Stable cardiomegaly. No acute osseous abnormality. IMPRESSION: Bilateral interstitial and alveolar airspace opacities with stable cardiomegaly. Differential considerations include pulmonary edema versus multilobar pneumonia. Electronically Signed   By: Kathreen Devoid   On: 03/06/2019 19:07   Dg Chest Port 1 View  Result Date: 03/08/2019 CLINICAL DATA:  Increased short of breath EXAM: PORTABLE CHEST 1 VIEW COMPARISON:  03/06/2019, 01/11/2019 FINDINGS: Cardiomegaly with vascular congestion and diffuse bilateral interstitial and ground-glass opacities suspicious for edema. Probable bilateral pleural effusions. Left basilar airspace consolidation. No pneumothorax. IMPRESSION: Cardiomegaly with worsening vascular congestion, pleural effusion, and interstitial and ground-glass opacities suspicious for edema. Left lung base consolidation could reflect atelectasis or pneumonia Electronically Signed   By: Donavan Foil M.D.   On: 03/08/2019 21:35   [place] Ekg Site Rite  Result Date: 03/09/2019 If Site Rite image not attached, placement could not be confirmed due to current cardiac rhythm.   Lab Data:  CBC: Recent Labs  Lab 03/06/19 1829  03/07/19 1842 03/08/19 5993 03/08/19 1846 03/09/19 0457 03/10/19 0539 03/11/19  0630  WBC 10.3   < > 8.8 9.0  --  10.5 9.0 9.2  NEUTROABS 8.1*  --  6.1  --   --  8.0* 8.6* 8.5*  HGB 6.7*   < > 7.5* 6.9* 8.1* 8.0* 8.0* 8.3*  HCT 24.7*   < > 26.0* 23.3* 27.9* 28.0* 28.0* 27.8*  MCV 86.7   < > 83.3 82.3  --  84.3 86.4 84.5  PLT 625*   < > 603* 549*  --  548* 501* 489*   < > = values in this interval not displayed.   Basic Metabolic Panel: Recent Labs  Lab 03/07/19 0524 03/08/19 0412 03/09/19 0457 03/10/19 0539 03/11/19 0630  NA 141 140 140 141 140  K 4.5 3.7 2.8* 3.2* 2.6*  CL 99 92* 91* 89* 82*  CO2 35* 35*  39* 39* 41*  GLUCOSE 113* 114* 121* 189* 293*  BUN 19 27* 28* 36* 49*  CREATININE 1.19 1.62* 1.71* 1.79* 1.73*  CALCIUM 8.3* 8.3* 8.2* 8.6* 8.7*  MG  --  2.3  --   --  2.2   GFR: Estimated Creatinine Clearance: 53 mL/min (A) (by C-G formula based on SCr of 1.73 mg/dL (H)). Liver Function Tests: Recent Labs  Lab 03/06/19 1829  AST 16  ALT 12  ALKPHOS 120  BILITOT 0.4  PROT 7.4  ALBUMIN 3.2*   No results for input(s): LIPASE, AMYLASE in the last 168 hours. Recent Labs  Lab 03/06/19 1854  AMMONIA 24   Coagulation Profile: No results for input(s): INR, PROTIME in the last 168 hours. Cardiac Enzymes: Recent Labs  Lab 03/06/19 1829 03/06/19 2103  TROPONINI 0.05* 0.04*   BNP (last 3 results) No results for input(s): PROBNP in the last 8760 hours. HbA1C: No results for input(s): HGBA1C in the last 72 hours. CBG: Recent Labs  Lab 03/10/19 0610 03/10/19 1104 03/10/19 1557 03/10/19 2129 03/11/19 0635  GLUCAP 172* 338* 245* 240* 245*   Lipid Profile: No results for input(s): CHOL, HDL, LDLCALC, TRIG, CHOLHDL, LDLDIRECT in the last 72 hours. Thyroid Function Tests: No results for input(s): TSH, T4TOTAL, FREET4, T3FREE, THYROIDAB in the last 72 hours. Anemia Panel: No results for input(s): VITAMINB12, FOLATE, FERRITIN, TIBC, IRON, RETICCTPCT in the last 72 hours. Urine analysis:    Component Value Date/Time   COLORURINE COLORLESS (A) 03/06/2019 [place] 03/06/2019 1829   LABSPEC 1.006 03/06/2019 [place] 5.0 03/06/2019 1829   GLUCOSEU NEGATIVE 03/06/2019 1829   HGBUR NEGATIVE 03/06/2019 1829   BILIRUBINUR NEGATIVE 03/06/2019 1829   KETONESUR NEGATIVE 03/06/2019 1829   PROTEINUR NEGATIVE 03/06/2019 1829   UROBILINOGEN 0.2 10/26/2014 2206   NITRITE NEGATIVE 03/06/2019 1829   LEUKOCYTESUR NEGATIVE 03/06/2019 1829     Emmanuelle Coxe M.D. [facility] 03/11/2019, 10:59 AM  Pager: [phone] Between 7am to 7pm - call Pager - [phone]   After 7pm go to [email or web address] - password [email or web address]  Call night coverage person covering after 7pm

## 2019-03-11 NOTE — Progress Notes (Addendum)
CRITICAL VALUE ALERT  Critical Value:  CO2 74.6   Date & Time Notied:  546503 @ 0710 Provider Notified: Yes;  Orders Received/Actions taken: Provider came to bedside

## 2019-03-11 NOTE — Progress Notes (Addendum)
Inpatient Diabetes Program Recommendations  AACE/ADA: New Consensus Statement on Inpatient Glycemic Control (2015)  Target Ranges:  Prepandial:   less than 140 mg/dL      Peak postprandial:   less than 180 mg/dL (1-2 hours)      Critically ill patients:  140 - 180 mg/dL    Results for Nicholas Caldwell, Nicholas Caldwell (MRN [id number]) as of 03/11/2019 07:16  Ref. Range 03/10/2019 06:10 03/10/2019 11:04 03/10/2019 15:57 03/10/2019 21:29  Glucose-Capillary Latest Ref Range: 70 - 99 mg/dL 172 (H)  3 units NOVOLOG +  10 units NPH given at 8am 338 (H)  11 units NOVOLOG  245 (H)  5 units NOVOLOG  240 (H)     10 units NPH given at 10pm   Results for Nicholas Caldwell, Nicholas Caldwell (MRN [id number]) as of 03/11/2019 07:16  Ref. Range 03/11/2019 06:35  Glucose-Capillary Latest Ref Range: 70 - 99 mg/dL 245 (H)    Home DM Meds: 70/30 Insulin- 30 units BID                             Metformin 1000 mg BID  Current Orders: NPH 10 units BID                            Novolog Moderate Correction Scale/ SSI (0-15 units) TID AC    Solumedrol reduced to 40 mg BID yesterday PM.  CBGs yesterday afternoon severely elevated likely due to the IV steroids.  CBG also elevated this AM.    MD- If you plan to keep patient on Solumedrol or switch to Prednisone today, may consider the following:  1. Start Novolog Meal Coverage:  Novolog 3 units TID with meals  (Please add the following Hold Parameters: Hold if pt eats <50% of meal, Hold if pt NPO)   2. May also consider adding bedtime SSI coverage to current SSI regimen (CBG at bedtime last night was 342 mg/dl)   3. Please also consider increasing NPH Insulin to 15 units BID     --Will follow patient during hospitalization--  Wyn Quaker RN, MSN, CDE Diabetes Coordinator Inpatient Glycemic Control Team Team Pager: [phone] (8a-5p)

## 2019-03-11 NOTE — Op Note (Addendum)
[facility] Patient Name: Nicholas Caldwell Procedure Date : 03/11/2019 MRN: [id number] Attending MD: Gerrit Heck , MD Date of Birth: [birth year] CSN: [id number] Age: 73 Admit Type: Inpatient Procedure:                Upper GI endoscopy Indications:              Iron deficiency anemia Providers:                Gerrit Heck, MD, Grace Isaac, RN, Raynelle Bring, RN, Ladona Ridgel, Technician, Elspeth Cho Tech., Technician, Luane School, CRNA Referring MD:              Medicines:                Monitored Anesthesia Care Complications:            No immediate complications. Estimated Blood Loss:     Estimated blood loss was minimal. Procedure:                Pre-Anesthesia Assessment:                           - Prior to the procedure, a History and Physical                            was performed, and patient medications and                            allergies were reviewed. The patient's tolerance of                            previous anesthesia was also reviewed. The risks                            and benefits of the procedure and the sedation                            options and risks were discussed with the patient.                            All questions were answered, and informed consent                            was obtained. Prior Anticoagulants: The patient has                            taken no previous anticoagulant or antiplatelet                            agents. ASA Grade Assessment: III - A patient with  severe systemic disease. After reviewing the risks                            and benefits, the patient was deemed in                            satisfactory condition to undergo the procedure.                           After obtaining informed consent, the endoscope was                            passed under direct vision. Throughout the    procedure, the patient's blood pressure, pulse, and                            oxygen saturations were monitored continuously. The                            GIF-H190 ([id number] Olympus gastroscope was                            introduced through the mouth, and advanced to the                            second part of duodenum. The upper GI endoscopy was                            accomplished without difficulty. The patient                            tolerated the procedure well. Scope In: Scope Out: Findings:      The examined esophagus was normal.      Localized severely erythematous, edematous, and necrotic appearing       mucosa was found in a localized area of the gastric fundus and proximal       gastric body. Biopsies were taken with a cold forceps for histology.       Estimated blood loss was minimal.      Localized mild inflammation characterized by erythema was found in the       gastric antrum and in the prepyloric region of the stomach.      The duodenal bulb, first portion of the duodenum and second portion of       the duodenum were normal. Impression:               - Normal esophagus.                           - Erythematous, edematous, and necrotic appearing                            mucosa in the gastric fundus and gastric body. The                            endoscopic appearance is worrisome for underlying  malignant etiology. This was extensively biopsied.                           - Gastritis.                           - Normal duodenal bulb, first portion of the                            duodenum and second portion of the duodenum. Recommendation:           - Return patient to hospital ward for ongoing care.                           - Full liquid diet today.                           - Continue present medications.                           - Use Protonix (pantoprazole) 40 mg PO BID for 8                            weeks to promote  mucosal healing.                           - Await pathology results. Procedure Code(s):        --- Professional ---                           [phone], Esophagogastroduodenoscopy, flexible,                            transoral; with biopsy, single or multiple Diagnosis Code(s):        --- Professional ---                           K31.89, Other diseases of stomach and duodenum                           D50.9, Iron deficiency anemia, unspecified CPT copyright 2019 [employer] Association. All rights reserved. The codes documented in this report are preliminary and upon coder review may  be revised to meet current compliance requirements. Gerrit Heck, MD 03/11/2019 2:35:25 PM Number of Addenda: 0

## 2019-03-11 NOTE — Interval H&P Note (Signed)
History and Physical Interval Note:  03/11/2019 1:24 PM  Nicholas Caldwell  has presented today for surgery, with the diagnosis of Iron deficiency anemia.  The various methods of treatment have been discussed with the patient and family. After consideration of risks, benefits and other options for treatment, the patient has consented to  Procedure(s): ESOPHAGOGASTRODUODENOSCOPY (EGD) (Left) as a surgical intervention.  The patient's history has been reviewed, patient examined, no change in status, stable for surgery.  I have reviewed the patient's chart and labs.  Questions were answered to the patient's satisfaction.     [name] [name]

## 2019-03-11 NOTE — Transfer of Care (Signed)
Immediate Anesthesia Transfer of Care Note  Patient: Nicholas Caldwell  Procedure(s) Performed: ESOPHAGOGASTRODUODENOSCOPY (EGD) (Left ) BIOPSY COLONOSCOPY (N/A )  Patient Location: Endoscopy Unit  Anesthesia Type:MAC  Level of Consciousness: awake, drowsy and patient cooperative  Airway & Oxygen Therapy: Patient Spontanous Breathing and Patient connected to face mask oxygen  Post-op Assessment: Report given to RN and Post -op Vital signs reviewed and stable  Post vital signs: Reviewed and stable  Last Vitals:  Vitals Value Taken Time  BP    Temp    Pulse    Resp    SpO2      Last Pain:  Vitals:   03/11/19 1138  TempSrc: Oral  PainSc: 0-No pain      Patients Stated Pain Goal: 3 ([id number])  Complications: No apparent anesthesia complications

## 2019-03-11 NOTE — Plan of Care (Signed)
  Problem: Education: Goal: Knowledge of General Education information will improve Description Including pain rating scale, medication(s)/side effects and non-pharmacologic comfort measures Outcome: Progressing   Problem: Health Behavior/Discharge Planning: Goal: Ability to manage health-related needs will improve Outcome: Progressing   Problem: Clinical Measurements: Goal: Ability to maintain clinical measurements within normal limits will improve Outcome: Progressing Goal: Will remain free from infection Outcome: Progressing Goal: Diagnostic test results will improve Outcome: Progressing Goal: Respiratory complications will improve Outcome: Progressing Goal: Cardiovascular complication will be avoided Outcome: Progressing   Problem: Activity: Goal: Risk for activity intolerance will decrease Outcome: Progressing   Problem: Nutrition: Goal: Adequate nutrition will be maintained Outcome: Progressing   Problem: Elimination: Goal: Will not experience complications related to bowel motility Outcome: Progressing Goal: Will not experience complications related to urinary retention Outcome: Progressing   Problem: Pain Managment: Goal: General experience of comfort will improve Outcome: Progressing   Problem: Safety: Goal: Ability to remain free from injury will improve Outcome: Progressing   Problem: Skin Integrity: Goal: Risk for impaired skin integrity will decrease Outcome: Progressing

## 2019-03-11 NOTE — Progress Notes (Signed)
CRITICAL VALUE ALERT  Critical Value:  K+ 2.6     Date & Time Notied:03/11/19  Provider Notified: YES   Orders Received/Actions taken: Provider notified; IV Potassium ordered and PO potassium

## 2019-03-11 NOTE — Progress Notes (Signed)
Patient ID: Nicholas Caldwell, male   DOB: 1946-07-15, 73 y.o.   MRN: [id number]     Advanced Heart Failure Rounding Note  PCP-Cardiologist: No primary care provider on file.   Subjective:    Still feels poorly.  Breathing is a little better diuresis.  Do not think all UOP was recorded but weight is down about 6 lbs.  Creatinine stable but BUN up a bit.   Co-ox 63%, CVP down to 13-14 today.   CXR 5/5: Pulmonary edema, left base consolidation.  Afebrile.  He had 1 blood culture positive for suspected coagulase negative Staph (contaminant). Steroids started 5/6.   He has had 2 units PRBCs, hgb stable at 8.3 today.  Remains off Xarelto.  He remains in atypical atrial flutter, rate 80s-100s.     Objective:   Weight Range: 119.4 kg Body mass index is 35.71 kg/m.   Vital Signs:   Temp:  [97.8 F (36.6 C)-99.2 F (37.3 C)] 98.3 F (36.8 C) (05/08 0740) Pulse Rate:  [79-90] 79 (05/08 0740) Resp:  [15-22] 22 (05/08 0740) BP: (118-140)/(58-98) 140/63 (05/08 0740) SpO2:  [92 %-98 %] 92 % (05/08 0740) Weight:  [119.4 kg] 119.4 kg (05/08 0800) Last BM Date: 03/11/19  Weight change: Filed Weights   03/09/19 0958 03/10/19 0425 03/11/19 0800  Weight: 122.2 kg 122.2 kg 119.4 kg    Intake/Output:   Intake/Output Summary (Last 24 hours) at 03/11/2019 0833 Last data filed at 03/11/2019 0817 Gross per 24 hour  Intake 2860 ml  Output 3450 ml  Net -590 ml      Physical Exam    General: NAD Neck: JVP 13-14, no thyromegaly or thyroid nodule.  Lungs: Crackles at bases, no more . CV: Nonpalpable PMI.  Heart irregular S1/S2, no S3/S4, no murmur.  1+ edema 1/2 to knees bilaterally.   Abdomen: Soft, nontender, no hepatosplenomegaly, no distention.  Skin: Intact without lesions or rashes.  Neurologic: Alert and oriented x 3.  Psych: Normal affect. Extremities: No clubbing or cyanosis.  HEENT: Normal.    Telemetry   Atrial flutter rate in 80s-100s (personally reviewed).   Labs     CBC Recent Labs    03/10/19 0539 03/11/19 0630  WBC 9.0 9.2  NEUTROABS 8.6* 8.5*  HGB 8.0* 8.3*  HCT 28.0* 27.8*  MCV 86.4 84.5  PLT 501* 505*   Basic Metabolic Panel Recent Labs    03/10/19 0539 03/11/19 0630  NA 141 140  K 3.2* 2.6*  CL 89* 82*  CO2 39* 41*  GLUCOSE 189* 293*  BUN 36* 49*  CREATININE 1.79* 1.73*  CALCIUM 8.6* 8.7*  MG  --  2.2   Liver Function Tests No results for input(s): AST, ALT, ALKPHOS, BILITOT, PROT, ALBUMIN in the last 72 hours. No results for input(s): LIPASE, AMYLASE in the last 72 hours. Cardiac Enzymes No results for input(s): CKTOTAL, CKMB, CKMBINDEX, TROPONINI in the last 72 hours.  BNP: BNP (last 3 results) Recent Labs    12/01/18 0015 12/30/18 1621 03/06/19 1829  BNP 1,666.7* 1,273.0* 1,020.1*    ProBNP (last 3 results) No results for input(s): PROBNP in the last 8760 hours.   D-Dimer No results for input(s): DDIMER in the last 72 hours. Hemoglobin A1C No results for input(s): HGBA1C in the last 72 hours. Fasting Lipid Panel No results for input(s): CHOL, HDL, LDLCALC, TRIG, CHOLHDL, LDLDIRECT in the last 72 hours. Thyroid Function Tests No results for input(s): TSH, T4TOTAL, T3FREE, THYROIDAB in the last 72 hours.  Invalid  input(s): FREET3  Other results:   Imaging    No results found.   Medications:     Scheduled Medications:  sodium chloride   Intravenous Once   arformoterol  15 mcg Nebulization BID   bisoprolol  2.5 mg Oral Daily   budesonide (PULMICORT) nebulizer solution  0.25 mg Nebulization BID   docusate sodium  100 mg Oral Daily   doxycycline  100 mg Oral Q12H   ferrous sulfate  325 mg Oral BID WC   folic acid  1 mg Oral Daily   gabapentin  600 mg Oral BID   insulin aspart  0-15 Units Subcutaneous TID WC   insulin NPH Human  15 Units Subcutaneous BID AC & HS   ipratropium-albuterol  3 mL Nebulization TID   isosorbide-hydrALAZINE  1 tablet Oral TID   latanoprost  1 drop Both  Eyes QHS   mouth rinse  15 mL Mouth Rinse BID   methylPREDNISolone (SOLU-MEDROL) injection  40 mg Intravenous Q12H   nicotine  7 mg Transdermal Daily   pantoprazole  40 mg Oral BID   peg 3350 powder  0.5 kit Oral Once   polyethylene glycol  17 g Oral Daily   potassium chloride  40 mEq Oral Daily   potassium chloride  40 mEq Oral Once   rosuvastatin  20 mg Oral q1800   sodium chloride flush  10-40 mL Intracatheter Q12H   sodium chloride flush  3 mL Intravenous Q12H   spironolactone  25 mg Oral Daily    Infusions:  sodium chloride     sodium chloride     furosemide (LASIX) infusion 15 mg/hr (03/10/19 0611)   potassium chloride      PRN Medications: sodium chloride, acetaminophen, fluticasone, guaiFENesin, ondansetron (ZOFRAN) IV, oxyCODONE-acetaminophen, sodium chloride flush, sodium chloride flush   Assessment/Plan   1. Acute on chronic systolic CHF: Nonischemic cardiomyopathy.  With prominent RV failure, likely related to COPD/OSA/OHS. TEE in 3/20 with EF 40-45%, severely dilated RV with moderately decreased systolic function. He has had multiple recent admissions with CHF and COPD exacerbations.  He has not cooperated with paramedicine or [facility] home [facility] as outpatient, has not been using CPAP per his wife, suspect significant medication noncompliance as well though he denies.  He remains volume overloaded but is diuresing and weight down 6 lbs over the last day.  CVP down to 13-14 from 21 yesterday and no longer wheezing.  Co-ox 63%.  BUN up some but creatinine stable.  - Continue Lasix 15 mg/hr and will give 5 mg po metolazone x 1 again.  He will need aggressive K repletion with repeat BMET in afternoon.  Will aim for 1 more day of aggressive diuresis, hopefully can get good response and switch to po tomorrow.  - Continue bisoprolol 2.5 daily.  - Continue Bidil 1 tab tid.  - Continue spironolactone 25 mg daily.    - Cardiomems may be helpful as outpatient.  2.  Anemia: Baseline hgb around 8.  6.7 at admission, denies overt GI bleeding.  He has had 2 unit PRBCs and IV Fe.  Hgb 8.3 today.   - Hold Xarelto for now, ?restart Eliquis instead given history of GI bleeding.  - GI has seen, plan for scopes today.  Ideally, would have him back on anticoagulation to allow TEE-guided DCCV to get him out of atrial flutter but need GI workup first.  3. Atrial fibrillation/flutter: Patient has history of paroxysmal atrial fibrillation.  This admission, he appears to be in  an atypical atrial flutter.  Rate is currently controlled. Xarelto on hold with GI bleeding.  Was seen in past by Dr. Rayann Heman, not thought to be good ablation candidate.  He has not been on Tikosyn or amiodarone due to history of long QT.  - Needs GI workup for source of anemia => plan for today.  When he is back on anticoagulation (would consider switch from Xarelto to Eliquis with GI bleeding), would plan TEE-guided DCCV.  - Will eventually ask EP to reconsider ablation given lack of good anti-arrhythmic options. He seems to do better in NSR.  4. COPD: Severe, on 3L home oxygen.  He was wheezing initially but now much improved.  This may be due to pulmonary edema, but also consider component of COPD exacerbation.  - Getting Duonebs.  - He is now on Solumedrol 60 mg IV q12.   5. OHS/OSA: On home oxygen.  Per wife, has not been using CPAP at home.  Will give CPAP at night here.  6. ID: Suspect this is primarily a CHF exacerbation.  Afebrile and WBCs not significantly elevated.  1 blood culture positive for what appears to be coagulase negative Staph (probably contaminant).  COVID-19 negative.  7. CAD: Nonobstructive on prior cath.  He is on Crestor.    Length of Stay: [place], MD  03/11/2019, 8:33 AM  Advanced Heart Failure Team Pager [phone] (M-F; 7a - 4p)  Please contact [facility] for night-coverage after hours (4p -7a ) and weekends on [email or web address]

## 2019-03-12 LAB — CBC WITH DIFFERENTIAL/PLATELET
Abs Immature Granulocytes: 0.04 10*3/uL (ref 0.00–0.07)
Basophils Absolute: 0 10*3/uL (ref 0.0–0.1)
Basophils Relative: 0 %
Eosinophils Absolute: 0 10*3/uL (ref 0.0–0.5)
Eosinophils Relative: 0 %
HCT: 28.4 % — ABNORMAL LOW (ref 39.0–52.0)
Hemoglobin: 8.4 g/dL — ABNORMAL LOW (ref 13.0–17.0)
Immature Granulocytes: 0 %
Lymphocytes Relative: 8 %
Lymphs Abs: 0.8 10*3/uL (ref 0.7–4.0)
MCH: 24.6 pg — ABNORMAL LOW (ref 26.0–34.0)
MCHC: 29.6 g/dL — ABNORMAL LOW (ref 30.0–36.0)
MCV: 83.3 fL (ref 80.0–100.0)
Monocytes Absolute: 1.3 10*3/uL — ABNORMAL HIGH (ref 0.1–1.0)
Monocytes Relative: 13 %
Neutro Abs: 8.3 10*3/uL — ABNORMAL HIGH (ref 1.7–7.7)
Neutrophils Relative %: 79 %
Platelets: 447 10*3/uL — ABNORMAL HIGH (ref 150–400)
RBC: 3.41 MIL/uL — ABNORMAL LOW (ref 4.22–5.81)
RDW: 23.3 % — ABNORMAL HIGH (ref 11.5–15.5)
WBC: 10.5 10*3/uL (ref 4.0–10.5)
nRBC: 1 % — ABNORMAL HIGH (ref 0.0–0.2)

## 2019-03-12 LAB — GLUCOSE, CAPILLARY
Glucose-Capillary: 231 mg/dL — ABNORMAL HIGH (ref 70–99)
Glucose-Capillary: 253 mg/dL — ABNORMAL HIGH (ref 70–99)
Glucose-Capillary: 205 mg/dL — ABNORMAL HIGH (ref 70–99)
Glucose-Capillary: 505 mg/dL (ref 70–99)

## 2019-03-12 LAB — BASIC METABOLIC PANEL
Anion gap: 15 (ref 5–15)
Anion gap: 13 (ref 5–15)
BUN: 51 mg/dL — ABNORMAL HIGH (ref 8–23)
BUN: 50 mg/dL — ABNORMAL HIGH (ref 8–23)
CO2: 43 mmol/L — ABNORMAL HIGH (ref 22–32)
CO2: 38 mmol/L — ABNORMAL HIGH (ref 22–32)
Calcium: 8.9 mg/dL (ref 8.9–10.3)
Calcium: 8.3 mg/dL — ABNORMAL LOW (ref 8.9–10.3)
Chloride: 82 mmol/L — ABNORMAL LOW (ref 98–111)
Chloride: 83 mmol/L — ABNORMAL LOW (ref 98–111)
Creatinine, Ser: 1.65 mg/dL — ABNORMAL HIGH (ref 0.61–1.24)
Creatinine, Ser: 1.64 mg/dL — ABNORMAL HIGH (ref 0.61–1.24)
GFR calc Af Amer: 48 mL/min — ABNORMAL LOW (ref >60)
GFR calc Af Amer: 48 mL/min — ABNORMAL LOW (ref >60)
GFR calc non Af Amer: 41 mL/min — ABNORMAL LOW (ref >60)
GFR calc non Af Amer: 42 mL/min — ABNORMAL LOW (ref >60)
Glucose, Bld: 167 mg/dL — ABNORMAL HIGH (ref 70–99)
Glucose, Bld: 283 mg/dL — ABNORMAL HIGH (ref 70–99)
Potassium: 2.4 mmol/L — CL (ref 3.5–5.1)
Potassium: 3.5 mmol/L (ref 3.5–5.1)
Sodium: 140 mmol/L (ref 135–145)
Sodium: 134 mmol/L — ABNORMAL LOW (ref 135–145)

## 2019-03-12 LAB — COOXEMETRY PANEL
Carboxyhemoglobin: 2.2 % — ABNORMAL HIGH (ref 0.5–1.5)
Methemoglobin: 1 % (ref 0.0–1.5)
O2 Saturation: 79.1 %
Total hemoglobin: 8.1 g/dL — ABNORMAL LOW (ref 12.0–16.0)

## 2019-03-12 MED ORDER — BUDESONIDE 0.5 MG/2ML IN SUSP
0.5000 mg | Freq: Two times a day (BID) | RESPIRATORY_TRACT | Status: DC
  Administered 2019-03-12 – 2019-03-31 (×38): 0.5 mg via RESPIRATORY_TRACT
  Filled 2019-03-12 (×40): qty 2

## 2019-03-12 MED ORDER — POTASSIUM CHLORIDE CRYS ER 20 MEQ PO TBCR
40.0000 meq | EXTENDED_RELEASE_TABLET | Freq: Once | ORAL | Status: AC
  Administered 2019-03-12: 40 meq via ORAL
  Filled 2019-03-12: qty 2

## 2019-03-12 MED ORDER — POTASSIUM CHLORIDE 10 MEQ/50ML IV SOLN
10.0000 meq | INTRAVENOUS | Status: AC
  Administered 2019-03-12 (×4): 10 meq via INTRAVENOUS
  Filled 2019-03-12 (×4): qty 50

## 2019-03-12 MED ORDER — INSULIN NPH (HUMAN) (ISOPHANE) 100 UNIT/ML ~~LOC~~ SUSP
20.0000 [IU] | Freq: Two times a day (BID) | SUBCUTANEOUS | Status: DC
  Administered 2019-03-12 – 2019-03-13 (×2): 20 [IU] via SUBCUTANEOUS
  Filled 2019-03-12: qty 10

## 2019-03-12 MED ORDER — METOLAZONE 2.5 MG PO TABS
2.5000 mg | ORAL_TABLET | Freq: Once | ORAL | Status: AC
  Administered 2019-03-12: 2.5 mg via ORAL
  Filled 2019-03-12 (×2): qty 1

## 2019-03-12 MED ORDER — INSULIN ASPART 100 UNIT/ML ~~LOC~~ SOLN
15.0000 [IU] | Freq: Once | SUBCUTANEOUS | Status: AC
  Administered 2019-03-12: 22:00:00 15 [IU] via SUBCUTANEOUS

## 2019-03-12 NOTE — Progress Notes (Signed)
[facility] ROUNDING NOTE   Subjective: No acute events overnight.  Tolerating p.o. intake.  Feels like his breathing has improved.  EGD and colonoscopy completed yesterday.  EGD notable for localized area of severely erythematous, edematous and necrotic appearing mucosa in the gastric fundus/proximal gastric body.  The endoscopic appearance is suspicious for malignancy, although DDX also includes infection and medication effect.  Biopsies pending.  Endoscopic appearance certainly seems consistent as high likely source for chronic GI blood loss leading to his IDA.  Small bowel otherwise normal (biopsies pending).  Colonoscopy completed and notable for a few diverticula in the ascending colon.  Bowel prep was poor, but the visualized mucosa was otherwise normal-appearing.  No areas of bleeding or stigmata of bleeding noted.  No blood throughout the lumen.   Objective: Vital signs in last 24 hours: Temp:  [97.7 F (36.5 C)-99.4 F (37.4 C)] 98.1 F (36.7 C) (05/09 1117) Pulse Rate:  [78-95] 80 (05/09 1117) Resp:  [14-25] 17 (05/09 1117) BP: (101-127)/(51-68) 101/59 (05/09 1117) SpO2:  [93 %-97 %] 96 % (05/09 1117) Weight:  [119.7 kg] 119.7 kg (05/09 0500) Last BM Date: 03/11/19 General: NAD Abdomen: Soft, NT, ND   Intake/Output from previous day: 05/08 0701 - 05/09 0700 In: 418 [P.O.:240; I.V.:178] Out: 6475 [Urine:6475] Intake/Output this shift: Total I/O In: 240 [P.O.:240] Out: 850 [Urine:850]   Lab Results: Recent Labs    03/10/19 0539 03/11/19 0630 03/12/19 0217  WBC 9.0 9.2 10.5  HGB 8.0* 8.3* 8.4*  PLT 501* 489* 447*  MCV 86.4 84.5 83.3   BMET Recent Labs    03/11/19 0630 03/11/19 1720 03/11/19 2113 03/12/19 0217  NA 140 135  --  140  K 2.6* 5.1  --  2.4*  CL 82* 92*  --  82*  CO2 41* 34*  --  43*  GLUCOSE 293* 377* 398* 167*  BUN 49* 43*  --  51*  CREATININE 1.73* 1.56*  --  1.65*  CALCIUM 8.7* 7.2*  --  8.9   LFT No results for  input(s): PROT, ALBUMIN, AST, ALT, ALKPHOS, BILITOT, BILIDIR, IBILI in the last 72 hours. PT/INR No results for input(s): INR in the last 72 hours.    Imaging/Other results: No results found.    Assessment and Plan:  73 year old male with multiple medical problems to include acute on chronic systolic CHF, IDA, atrial fibrillation/flutter, COPD on 3 L home O2, OSA, noncompliance, CAD, admitted with CHF exacerbation complicated by IDA, requiring PRBC transfusions and IV iron.  Last transfused on 5/5 with stable hemoglobin 8.4 today.Otherwise without overt GI blood loss.   1) Iron Deficiency Anemia 2) AKI  3) HF 4) Atrial fibrillation/flutter 5) COPD exacerbation, O2 dependent 6) CAD  -EGD with localized area of severely erythematous, edematous, necrotic appearing tissue in the proximal stomach.  Biopsies pending.  Appearance suspicious for malignancy.  Given endoscopic appearance and high likelihood of intermittent oozing from the site, recommend continue to hold anticoagulation for now while biopsies still pending -Continue daily H/H checks.  Hemoglobin has remained stable -Can advance diet as tolerated -Pending biopsy findings, if e/o malignancy will discuss treatment options at that time.  Had a CT C/A/P done in March without concerning findings noted -Understand that we continue to be in a predicament, as he needs systemic anticoagulation for a pending TEE guided cardioversion, but anticoagulation will likely worsen gastric oozing -If no malignancy noted on the biopsies, may be worth endoscopic relook early in the week prior to proceeding with  AC - Sincerely appreciate the excellent care of this gentleman by the Hospitalist and Heart Failure services. - Biopsies will not return until Monday at the earliest per Pathologist -GI service will follow peripherally through the remainder of the weekend.  However please not hesitate to contact me with any questions or concerns.   Lavena Bullion, DO  03/12/2019, 12:16 PM [facility] Pager [phone]

## 2019-03-12 NOTE — Progress Notes (Signed)
[facility]                                                                              Patient Demographics  Nicholas Caldwell, is a 73 y.o. male, DOB - August 22, 1946, YEL:[id number]  Admit date - 03/06/2019   Admitting Physician Etta Quill, DO  Outpatient Primary MD for the patient is [facility]  Outpatient specialists:    LOS - 6  days   Medical records reviewed and are as summarized below:    Chief Complaint  Patient presents with   Shortness of Breath       Brief summary   73 year old male with history of combined systolic and diastolic CHF with EF of 16-24% (per TEE in March), paroxysmal A. fib on Xarelto, COPD on 3 L home O2, noncompliance with inhalers with ongoing tobacco use, possibly diuretic, morbid obesity with OSA not adherent to CPAP presented with acute on chronic hypoxic respiratory failure secondary to acute on chronic combined systolic and diastolic CHF.  Patient had gross anasarca with pulmonary edema on chest x-ray and BNP of thousand.  Required nebs in the ED, higher oxygen and IV Lasix.  Admitted for aggressive IV diuresis.  Also found to have severe iron deficiency anemia.   Assessment & Plan    Principal Problem:   Acute on chronic respiratory failure with hypoxia and hypercapnia (HCC) -Likely secondary to acute on chronic combined systolic and diastolic CHF, COPD exacerbation - wheezing b/l, O2 sats 96% on 4L   Active Problems:  Acute on chronic combined systolic and diastolic CHF.  Underlying nonischemic cardiomyopathy,  RV failure secondary to COPD, OSA, OHS, medication noncompliance -Still volume overloaded, anasarca -TEE in 3/20 with EF 40 to 45% with severely dilated RV and moderately decreased systolic function. -Continue beta-blocker, BiDil, spironolactone, follow creatinine closely  -Strict I's and O's, negative balance of 10.8 L - weight on admission 265lbs->276-> today 263 lbs  -Continue IV Lasix  drip  Hypokalemia -Potassium 2.4, aggressively replacing potassium, will recheck today  -Magnesium 2.2  Acute COPD exacerbation -Still somewhat wheezing today.    -Currently on maximal treatment with duo nebs, -Increase Pulmicort 2.5 mg twice daily, continue Brovana  -IV Solu-Medrol 40 mg every 12 hours, flutter valve, PPI, doxycycline  Started on doxycycline  Severe iron deficiency anemia -Baseline hemoglobin around 8-9, hemoglobin 6.9 on 5/5, received 1 unit packed RBCs and Feraheme -Continue PPI.  -EGD showed localized area of severely erythematous and edematous and necrotic appearing mucosa in the gastric fundus and proximal gastric body, suspicious for malignancy, follow biopsy results.  Colonoscopy normal. Discussed with surgery, recommended to wait for biopsy before any management is planned, will also need oncology to weigh in prior to any surgical option.  Acute kidney injury -Creatinine stable and improving, follow closely with diuresis  Essential hypertension Currently stable, continue bisoprolol, spironolactone  Type 2 diabetes mellitus with neuropathy -CBGs elevated due to steroids - Increase Novolin to 20 units twice daily, continue sliding scale insulin  Paroxysmal atrial fibrillation Rate controlled, Xarelto currently on hold   NSVT -Keep potassium above 4, aggressively replace, magnesium normal Replacing potassium  Nicotine  abuse Patient counseled on nicotine cessation, continue nicotine patch  Hyperlipidemia Continue statin   Code Status: Full code DVT Prophylaxis: SCD's, Xarelto on hold Family Communication: Discussed in detail with the patient, all imaging results, lab results explained to the patient.  Discussed with patient's wife on phone yesterday.   Disposition Plan: Biopsy results pending, still volume overloaded on Lasix drip, high risk of decompensation.   Time Spent in minutes 25 minutes  Procedures:   EGD Colonoscopy  Consultants:   [facility]  Antimicrobials:   Anti-infectives (From admission, onward)   Start     Dose/Rate Route Frequency Ordered Stop   03/11/19 1000  doxycycline (VIBRA-TABS) tablet 100 mg     100 mg Oral Every 12 hours 03/11/19 0733     03/06/19 1945  ceFEPIme (MAXIPIME) 2 g in sodium chloride 0.9 % 100 mL IVPB     2 g 200 mL/hr over 30 Minutes Intravenous  Once 03/06/19 1941 03/06/19 2046   03/06/19 1945  vancomycin (VANCOCIN) 2,000 mg in sodium chloride 0.9 % 500 mL IVPB     2,000 mg 250 mL/hr over 120 Minutes Intravenous  Once 03/06/19 1941 03/07/19 0015         Medications  Scheduled Meds:  arformoterol  15 mcg Nebulization BID   bisoprolol  2.5 mg Oral Daily   budesonide (PULMICORT) nebulizer solution  0.25 mg Nebulization BID   docusate sodium  100 mg Oral Daily   doxycycline  100 mg Oral Q12H   ferrous sulfate  325 mg Oral BID WC   folic acid  1 mg Oral Daily   gabapentin  600 mg Oral BID   insulin aspart  0-15 Units Subcutaneous TID WC   insulin aspart  0-5 Units Subcutaneous QHS   insulin NPH Human  15 Units Subcutaneous BID AC & HS   ipratropium-albuterol  3 mL Nebulization TID   isosorbide-hydrALAZINE  1 tablet Oral TID   latanoprost  1 drop Both Eyes QHS   mouth rinse  15 mL Mouth Rinse BID   methylPREDNISolone (SOLU-MEDROL) injection  40 mg Intravenous Q12H   nicotine  7 mg Transdermal Daily   pantoprazole  40 mg Oral BID   peg 3350 powder  0.5 kit Oral Once   polyethylene glycol  17 g Oral Daily   potassium chloride  40 mEq Oral Daily   potassium chloride  40 mEq Oral Once   rosuvastatin  20 mg Oral q1800   sodium chloride flush  10-40 mL Intracatheter Q12H   sodium chloride flush  3 mL Intravenous Q12H   spironolactone  25 mg Oral Daily   Continuous Infusions:  sodium chloride     furosemide (LASIX) infusion 15 mg/hr (03/12/19 0000)   potassium chloride 10 mEq (03/12/19 1334)    PRN Meds:.sodium chloride, acetaminophen, fluticasone, guaiFENesin, ondansetron (ZOFRAN) IV, oxyCODONE-acetaminophen, sodium chloride flush, sodium chloride flush      Subjective:   Remmy Riffe was seen and examined today.  Wheezing at the time of my examination.  No fevers or chills, no other complaints.  No chest pain. Patient denies dizziness, abdominal pain, N/V/D/C, new weakness, numbess, tingling. No acute events overnight.    Objective:   Vitals:   03/12/19 0743 03/12/19 0839 03/12/19 0841 03/12/19 1117  BP: 110/61   (!) 101/59  Pulse:    80  Resp:    17  Temp: 98.3 F (36.8 C)   98.1 F (36.7 C)  TempSrc: Oral   Oral  SpO2: (  P) 96% 95% 95% 96%  Weight:      Height:        Intake/Output Summary (Last 24 hours) at 03/12/2019 1337 Last data filed at 03/12/2019 1221 Gross per 24 hour  Intake 1018 ml  Output 4125 ml  Net -3107 ml     Wt Readings from Last 3 Encounters:  03/12/19 119.7 kg  01/24/19 115.8 kg  01/14/19 116.6 kg   Physical Exam  General: Alert and oriented x 3, NAD  Eyes:,  HEENT:  Atraumatic, normocephalic  Cardiovascular: S1 S2 clear, no murmurs, RRR.  Trace pedal edema b/l  Respiratory: Bilateral expiratory wheezing  Gastrointestinal: Soft, nontender, nondistended, NBS  Ext: trace pedal edema bilaterally  Neuro: no new deficits  Musculoskeletal: No cyanosis, clubbing  Skin: No rashes  Psych: Normal affect and demeanor, alert and oriented x3   Data Reviewed:  I have personally reviewed following labs and imaging studies  Micro Results Recent Results (from the past 240 hour(s))  SARS Coronavirus 2 (CEPHEID- Performed in [place] hospital lab), Hosp Order     Status: None   Collection Time: 03/06/19  6:29 PM  Result Value Ref Range Status   SARS Coronavirus 2 NEGATIVE NEGATIVE Final    Comment: (NOTE) If result is NEGATIVE SARS-CoV-2 target nucleic acids are NOT DETECTED. The SARS-CoV-2 RNA is generally detectable in upper  and lower  respiratory specimens during the acute phase of infection. The lowest  concentration of SARS-CoV-2 viral copies this assay can detect is 250  copies / mL. A negative result does not preclude SARS-CoV-2 infection  and should not be used as the sole basis for treatment or other  patient management decisions.  A negative result may occur with  improper specimen collection / handling, submission of specimen other  than nasopharyngeal swab, presence of viral mutation(s) within the  areas targeted by this assay, and inadequate number of viral copies  (<250 copies / mL). A negative result must be combined with clinical  observations, patient history, and epidemiological information. If result is POSITIVE SARS-CoV-2 target nucleic acids are DETECTED. The SARS-CoV-2 RNA is generally detectable in upper and lower  respiratory specimens dur ing the acute phase of infection.  Positive  results are indicative of active infection with SARS-CoV-2.  Clinical  correlation with patient history and other diagnostic information is  necessary to determine patient infection status.  Positive results do  not rule out bacterial infection or co-infection with other viruses. If result is PRESUMPTIVE POSTIVE SARS-CoV-2 nucleic acids MAY BE PRESENT.   A presumptive positive result was obtained on the submitted specimen  and confirmed on repeat testing.  While 2019 novel coronavirus  (SARS-CoV-2) nucleic acids may be present in the submitted sample  additional confirmatory testing may be necessary for epidemiological  and / or clinical management purposes  to differentiate between  SARS-CoV-2 and other Sarbecovirus currently known to infect humans.  If clinically indicated additional testing with an alternate test  methodology [phone]) is advised. The SARS-CoV-2 RNA is generally  detectable in upper and lower respiratory sp ecimens during the acute  phase of infection. The expected result is  Negative. Fact Sheet for Patients:  StrictlyIdeas.no Fact Sheet for Healthcare Providers: BankingDealers.co.za This test is not yet approved or cleared by the [place] FDA and has been authorized for detection and/or diagnosis of SARS-CoV-2 by FDA under an Emergency Use Authorization (EUA).  This EUA will remain in effect (meaning this test can be used) for the duration  of the COVID-19 declaration under Section 564(b)(1) of the Act, 21 U.S.C. section 360bbb-3(b)(1), unless the authorization is terminated or revoked sooner. Performed at [facility], [place] [address], [place], [place] [address]   Urine culture     Status: Abnormal   Collection Time: 03/06/19  6:29 PM  Result Value Ref Range Status   Specimen Description URINE, RANDOM  Final   Special Requests NONE  Final   Culture (A)  Final    <10,000 COLONIES/mL INSIGNIFICANT GROWTH Performed at [facility], [place] [address], [place], [place] [address]    Report Status 03/07/2019 FINAL  Final  Blood culture (routine x 2)     Status: Abnormal   Collection Time: 03/06/19  6:54 PM  Result Value Ref Range Status   Specimen Description BLOOD RIGHT ANTECUBITAL  Final   Special Requests   Final    BOTTLES DRAWN AEROBIC AND ANAEROBIC Blood Culture adequate volume   Culture  Setup Time   Final    GRAM POSITIVE COCCI IN BOTH AEROBIC AND ANAEROBIC BOTTLES CRITICAL RESULT CALLED TO, READ BACK BY AND VERIFIED WITH: PHARMD T DANG [id number] MLM    Culture (A)  Final    STAPHYLOCOCCUS SPECIES (COAGULASE NEGATIVE) THE SIGNIFICANCE OF ISOLATING THIS ORGANISM FROM A SINGLE SET OF BLOOD CULTURES WHEN MULTIPLE SETS ARE DRAWN IS UNCERTAIN. PLEASE NOTIFY THE MICROBIOLOGY DEPARTMENT WITHIN ONE WEEK IF SPECIATION AND SENSITIVITIES ARE REQUIRED. Performed at [facility], [place] [address] [place], [place] [address]    Report Status 03/09/2019 FINAL  Final  Blood Culture ID Panel  (Reflexed)     Status: Abnormal   Collection Time: 03/06/19  6:54 PM  Result Value Ref Range Status   Enterococcus species NOT DETECTED NOT DETECTED Final   Listeria monocytogenes NOT DETECTED NOT DETECTED Final   Staphylococcus species DETECTED (A) NOT DETECTED Final    Comment: Methicillin (oxacillin) resistant coagulase negative staphylococcus. Possible blood culture contaminant (unless isolated from more than one blood culture draw or clinical case suggests pathogenicity). No antibiotic treatment is indicated for blood  culture contaminants. CRITICAL RESULT CALLED TO, READ BACK BY AND VERIFIED WITH: PHARMD T DANG [id number] MLM    Staphylococcus aureus (BCID) NOT DETECTED NOT DETECTED Final   Methicillin resistance DETECTED (A) NOT DETECTED Final    Comment: CRITICAL RESULT CALLED TO, READ BACK BY AND VERIFIED WITH: PHARMD T DANG [id number] MLM    Streptococcus species NOT DETECTED NOT DETECTED Final   Streptococcus agalactiae NOT DETECTED NOT DETECTED Final   Streptococcus pneumoniae NOT DETECTED NOT DETECTED Final   Streptococcus pyogenes NOT DETECTED NOT DETECTED Final   Acinetobacter baumannii NOT DETECTED NOT DETECTED Final   Enterobacteriaceae species NOT DETECTED NOT DETECTED Final   Enterobacter cloacae complex NOT DETECTED NOT DETECTED Final   Escherichia coli NOT DETECTED NOT DETECTED Final   Klebsiella oxytoca NOT DETECTED NOT DETECTED Final   Klebsiella pneumoniae NOT DETECTED NOT DETECTED Final   Proteus species NOT DETECTED NOT DETECTED Final   Serratia marcescens NOT DETECTED NOT DETECTED Final   Haemophilus influenzae NOT DETECTED NOT DETECTED Final   Neisseria meningitidis NOT DETECTED NOT DETECTED Final   Pseudomonas aeruginosa NOT DETECTED NOT DETECTED Final   Candida albicans NOT DETECTED NOT DETECTED Final   Candida glabrata NOT DETECTED NOT DETECTED Final   Candida krusei NOT DETECTED NOT DETECTED Final   Candida parapsilosis NOT DETECTED NOT DETECTED  Final   Candida tropicalis NOT DETECTED NOT DETECTED  Final    Comment: Performed at [facility], [place] [address], [place], [place] [address]  Blood culture (routine x 2)     Status: None   Collection Time: 03/06/19  6:59 PM  Result Value Ref Range Status   Specimen Description BLOOD LEFT HAND  Final   Special Requests   Final    BOTTLES DRAWN AEROBIC AND ANAEROBIC Blood Culture results may not be optimal due to an inadequate volume of blood received in culture bottles   Culture   Final    NO GROWTH 5 DAYS Performed at [facility], [place] [address] [place], [place] [address]    Report Status 03/11/2019 FINAL  Final  Culture, blood (routine x 2)     Status: None (Preliminary result)   Collection Time: 03/07/19  6:42 PM  Result Value Ref Range Status   Specimen Description BLOOD RIGHT HAND  Final   Special Requests   Final    BOTTLES DRAWN AEROBIC AND ANAEROBIC Blood Culture adequate volume   Culture   Final    NO GROWTH 4 DAYS Performed at [facility], [place] [address], [place], [place] [address]    Report Status PENDING  Incomplete  Culture, blood (routine x 2)     Status: None (Preliminary result)   Collection Time: 03/07/19  6:42 PM  Result Value Ref Range Status   Specimen Description BLOOD LEFT HAND  Final   Special Requests   Final    BOTTLES DRAWN AEROBIC ONLY Blood Culture results may not be optimal due to an inadequate volume of blood received in culture bottles   Culture   Final    NO GROWTH 4 DAYS Performed at [facility], [place] [address] [place], [place] [address]    Report Status PENDING  Incomplete    Radiology Reports Dg Chest 2 View  Result Date: 03/06/2019 CLINICAL DATA:  Shortness of breath EXAM: CHEST - 2 VIEW COMPARISON:  01/11/2011 FINDINGS: Bilateral interstitial and alveolar airspace opacities. Small left pleural effusion. No pneumothorax. Stable cardiomegaly. No acute osseous abnormality. IMPRESSION: Bilateral interstitial and  alveolar airspace opacities with stable cardiomegaly. Differential considerations include pulmonary edema versus multilobar pneumonia. Electronically Signed   By: Kathreen Devoid   On: 03/06/2019 19:07   Dg Chest Port 1 View  Result Date: 03/08/2019 CLINICAL DATA:  Increased short of breath EXAM: PORTABLE CHEST 1 VIEW COMPARISON:  03/06/2019, 01/11/2019 FINDINGS: Cardiomegaly with vascular congestion and diffuse bilateral interstitial and ground-glass opacities suspicious for edema. Probable bilateral pleural effusions. Left basilar airspace consolidation. No pneumothorax. IMPRESSION: Cardiomegaly with worsening vascular congestion, pleural effusion, and interstitial and ground-glass opacities suspicious for edema. Left lung base consolidation could reflect atelectasis or pneumonia Electronically Signed   By: Donavan Foil M.D.   On: 03/08/2019 21:35   [place] Ekg Site Rite  Result Date: 03/09/2019 If Site Rite image not attached, placement could not be confirmed due to current cardiac rhythm.   Lab Data:  CBC: Recent Labs  Lab 03/07/19 1842 03/08/19 0412 03/08/19 1846 03/09/19 0457 03/10/19 0539 03/11/19 0630 03/12/19 0217  WBC 8.8 9.0  --  10.5 9.0 9.2 10.5  NEUTROABS 6.1  --   --  8.0* 8.6* 8.5* 8.3*  HGB 7.5* 6.9* 8.1* 8.0* 8.0* 8.3* 8.4*  HCT 26.0* 23.3* 27.9* 28.0* 28.0* 27.8* 28.4*  MCV 83.3 82.3  --  84.3 86.4 84.5 83.3  PLT 603* 549*  --  548* 501* 489* 447*   Basic  Metabolic Panel: Recent Labs  Lab 03/08/19 0412 03/09/19 0457 03/10/19 0539 03/11/19 0630 03/11/19 1720 03/11/19 2113 03/12/19 0217  NA 140 140 141 140 135  --  140  K 3.7 2.8* 3.2* 2.6* 5.1  --  2.4*  CL 92* 91* 89* 82* 92*  --  82*  CO2 35* 39* 39* 41* 34*  --  43*  GLUCOSE 114* 121* 189* 293* 377* 398* 167*  BUN 27* 28* 36* 49* 43*  --  51*  CREATININE 1.62* 1.71* 1.79* 1.73* 1.56*  --  1.65*  CALCIUM 8.3* 8.2* 8.6* 8.7* 7.2*  --  8.9  MG 2.3  --   --  2.2  --   --   --    GFR: Estimated Creatinine  Clearance: 55.6 mL/min (A) (by C-G formula based on SCr of 1.65 mg/dL (H)). Liver Function Tests: Recent Labs  Lab 03/06/19 1829  AST 16  ALT 12  ALKPHOS 120  BILITOT 0.4  PROT 7.4  ALBUMIN 3.2*   No results for input(s): LIPASE, AMYLASE in the last 168 hours. Recent Labs  Lab 03/06/19 1854  AMMONIA 24   Coagulation Profile: No results for input(s): INR, PROTIME in the last 168 hours. Cardiac Enzymes: Recent Labs  Lab 03/06/19 1829 03/06/19 2103  TROPONINI 0.05* 0.04*   BNP (last 3 results) No results for input(s): PROBNP in the last 8760 hours. HbA1C: No results for input(s): HGBA1C in the last 72 hours. CBG: Recent Labs  Lab 03/11/19 1619 03/11/19 2034 03/11/19 2233 03/12/19 0555 03/12/19 1127  GLUCAP 231* 409* 403* 231* 253*   Lipid Profile: No results for input(s): CHOL, HDL, LDLCALC, TRIG, CHOLHDL, LDLDIRECT in the last 72 hours. Thyroid Function Tests: No results for input(s): TSH, T4TOTAL, FREET4, T3FREE, THYROIDAB in the last 72 hours. Anemia Panel: No results for input(s): VITAMINB12, FOLATE, FERRITIN, TIBC, IRON, RETICCTPCT in the last 72 hours. Urine analysis:    Component Value Date/Time   COLORURINE COLORLESS (A) 03/06/2019 [place] 03/06/2019 1829   LABSPEC 1.006 03/06/2019 [place] 5.0 03/06/2019 1829   GLUCOSEU NEGATIVE 03/06/2019 1829   HGBUR NEGATIVE 03/06/2019 1829   BILIRUBINUR NEGATIVE 03/06/2019 1829   KETONESUR NEGATIVE 03/06/2019 1829   PROTEINUR NEGATIVE 03/06/2019 1829   UROBILINOGEN 0.2 10/26/2014 2206   NITRITE NEGATIVE 03/06/2019 1829   LEUKOCYTESUR NEGATIVE 03/06/2019 1829     Keeya Dyckman M.D. [facility] 03/12/2019, 1:37 PM  Pager: [phone] Between 7am to 7pm - call Pager - [phone]  After 7pm go to [email or web address] - password [email or web address]  Call night coverage person covering after 7pm

## 2019-03-12 NOTE — Progress Notes (Signed)
Patient ID: Nicholas Caldwell, male   DOB: 11-28-1945, 73 y.o.   MRN: [id number]     Advanced Heart Failure Rounding Note  PCP-Cardiologist: No primary care provider on file.   Subjective:    Still short of breath.  CVP remains around 18 but had good diuresis yesterday with IV Lasix and metolazone.  Co-ox 79%.   CXR 5/5: Pulmonary edema, left base consolidation.  Afebrile.  He had 1 blood culture positive for suspected coagulase negative Staph (contaminant). Steroids started 5/6, now on doxycycline as well.   He has had 2 units PRBCs, hgb stable at 8.4 today.  Remains off Xarelto.  Colonoscopy 5/8 was unremarkable, but EGD showed necrotic-appearing mucosa gastric fundus/body, concern for gastric cancer.  Biopsies taken.   He remains in atypical atrial flutter, rate 80s-100s.     Objective:   Weight Range: 119.7 kg Body mass index is 35.79 kg/m.   Vital Signs:   Temp:  [97.7 F (36.5 C)-99.4 F (37.4 C)] 98.3 F (36.8 C) (05/09 0743) Pulse Rate:  [78-95] 82 (05/09 0628) Resp:  [14-25] 18 (05/09 0400) BP: (110-127)/(47-68) 110/61 (05/09 0743) SpO2:  [93 %-97 %] 95 % (05/09 0841) Weight:  [119.7 kg] 119.7 kg (05/09 0500) Last BM Date: 03/11/19  Weight change: Filed Weights   03/10/19 0425 03/11/19 0800 03/12/19 0500  Weight: 122.2 kg 119.4 kg 119.7 kg    Intake/Output:   Intake/Output Summary (Last 24 hours) at 03/12/2019 0936 Last data filed at 03/12/2019 0747 Gross per 24 hour  Intake 658 ml  Output 4375 ml  Net -3717 ml      Physical Exam    General: NAD Neck: JVP 14-16, no thyromegaly or thyroid nodule.  Lungs: Decreased BS at bases.  CV: Nondisplaced PMI.  Heart regular S1/S2, no S3/S4, no murmur.  1+ ankle edema.   Abdomen: Soft, nontender, no hepatosplenomegaly, no distention.  Skin: Intact without lesions or rashes.  Neurologic: Alert and oriented x 3.  Psych: Normal affect. Extremities: No clubbing or cyanosis.  HEENT: Normal.    Telemetry   Atrial  flutter rate in 80s (personally reviewed).   Labs    CBC Recent Labs    03/11/19 0630 03/12/19 0217  WBC 9.2 10.5  NEUTROABS 8.5* 8.3*  HGB 8.3* 8.4*  HCT 27.8* 28.4*  MCV 84.5 83.3  PLT 489* 438*   Basic Metabolic Panel Recent Labs    03/11/19 0630 03/11/19 1720 03/11/19 2113 03/12/19 0217  NA 140 135  --  140  K 2.6* 5.1  --  2.4*  CL 82* 92*  --  82*  CO2 41* 34*  --  43*  GLUCOSE 293* 377* 398* 167*  BUN 49* 43*  --  51*  CREATININE 1.73* 1.56*  --  1.65*  CALCIUM 8.7* 7.2*  --  8.9  MG 2.2  --   --   --    Liver Function Tests No results for input(s): AST, ALT, ALKPHOS, BILITOT, PROT, ALBUMIN in the last 72 hours. No results for input(s): LIPASE, AMYLASE in the last 72 hours. Cardiac Enzymes No results for input(s): CKTOTAL, CKMB, CKMBINDEX, TROPONINI in the last 72 hours.  BNP: BNP (last 3 results) Recent Labs    12/01/18 0015 12/30/18 1621 03/06/19 1829  BNP 1,666.7* 1,273.0* 1,020.1*    ProBNP (last 3 results) No results for input(s): PROBNP in the last 8760 hours.   D-Dimer No results for input(s): DDIMER in the last 72 hours. Hemoglobin A1C No results for input(s): HGBA1C  in the last 72 hours. Fasting Lipid Panel No results for input(s): CHOL, HDL, LDLCALC, TRIG, CHOLHDL, LDLDIRECT in the last 72 hours. Thyroid Function Tests No results for input(s): TSH, T4TOTAL, T3FREE, THYROIDAB in the last 72 hours.  Invalid input(s): FREET3  Other results:   Imaging    No results found.   Medications:     Scheduled Medications: . arformoterol  15 mcg Nebulization BID  . bisoprolol  2.5 mg Oral Daily  . budesonide (PULMICORT) nebulizer solution  0.25 mg Nebulization BID  . docusate sodium  100 mg Oral Daily  . doxycycline  100 mg Oral Q12H  . ferrous sulfate  325 mg Oral BID WC  . folic acid  1 mg Oral Daily  . gabapentin  600 mg Oral BID  . insulin aspart  0-15 Units Subcutaneous TID WC  . insulin aspart  0-5 Units Subcutaneous QHS   . insulin NPH Human  15 Units Subcutaneous BID AC & HS  . ipratropium-albuterol  3 mL Nebulization TID  . isosorbide-hydrALAZINE  1 tablet Oral TID  . latanoprost  1 drop Both Eyes QHS  . mouth rinse  15 mL Mouth Rinse BID  . methylPREDNISolone (SOLU-MEDROL) injection  40 mg Intravenous Q12H  . metolazone  2.5 mg Oral Once  . nicotine  7 mg Transdermal Daily  . pantoprazole  40 mg Oral BID  . peg 3350 powder  0.5 kit Oral Once  . polyethylene glycol  17 g Oral Daily  . potassium chloride  40 mEq Oral Daily  . potassium chloride  40 mEq Oral Once  . rosuvastatin  20 mg Oral q1800  . sodium chloride flush  10-40 mL Intracatheter Q12H  . sodium chloride flush  3 mL Intravenous Q12H  . spironolactone  25 mg Oral Daily    Infusions: . sodium chloride    . furosemide (LASIX) infusion 15 mg/hr (03/12/19 0000)  . potassium chloride      PRN Medications: sodium chloride, acetaminophen, fluticasone, guaiFENesin, ondansetron (ZOFRAN) IV, oxyCODONE-acetaminophen, sodium chloride flush, sodium chloride flush   Assessment/Plan   1. Acute on chronic systolic CHF: Nonischemic cardiomyopathy.  With prominent RV failure, likely related to COPD/OSA/OHS. TEE in 3/20 with EF 40-45%, severely dilated RV with moderately decreased systolic function. He has had multiple recent admissions with CHF and COPD exacerbations.  He has not cooperated with paramedicine or [facility] home health as outpatient, has not been using CPAP per his wife, suspect significant medication noncompliance as well though he denies.  He remains volume overloaded but is diuresing on current regimen.  CVP still 18 but longer wheezing.  Co-ox 79%.  Mild rise in BUN/creatinine. - Continue Lasix 15 mg/hr and will give 2.5 mg po metolazone x 1.  He will need aggressive K repletion with repeat BMET this afternoon.   - Continue bisoprolol 2.5 daily.  - Continue Bidil 1 tab tid.  - Continue spironolactone 25 mg daily.    - Cardiomems may be  helpful as outpatient.  2. Anemia: Baseline hgb around 8.  6.7 at admission, denies overt GI bleeding.  He has had 2 unit PRBCs and IV Fe.  Hgb 8.4 today.  Colonoscopy 5/8 unremarkable but EGD showed necrotic mucosa gastric fundus/body that is concerning for gastric cancer, biopsies taken.  - Hold Xarelto for now, ?restart Eliquis instead in future given history of GI bleeding. Will await GI guidance regarding restarting anticoagulation, based on EGD findings may need to hold off for now pending need for further  management of gastric lesion.  3. Atrial fibrillation/flutter: Patient has history of paroxysmal atrial fibrillation.  This admission, he appears to be in an atypical atrial flutter.  Rate is currently controlled. Xarelto on hold with GI bleeding.  Was seen in past by Dr. Rayann Heman, not thought to be good ablation candidate.  He has not been on Tikosyn or amiodarone due to history of long QT.  - Would like to do TEE-guided DCCV, but will need to wait on GI guidance regarding restarting anticoagulation given the lesion in his stomach.   - Will eventually ask EP to reconsider ablation given lack of good anti-arrhythmic options. He seems to do better in NSR.  4. COPD: Severe, on 3L home oxygen.  He was wheezing initially but now much improved.  This may be due to pulmonary edema, but also consider component of COPD exacerbation.  - Getting Duonebs.  - He is now on Solumedrol 60 mg IV q12 and doxycycline 100 mg bid.   5. OHS/OSA: On home oxygen.  Per wife, has not been using CPAP at home.  Will give CPAP at night here.  6. ID: Suspect this is primarily a CHF exacerbation.  Afebrile and WBCs not significantly elevated.  1 blood culture positive for what appears to be coagulase negative Staph (probably contaminant).  COVID-19 negative.  7. CAD: Nonobstructive on prior cath.  He is on Crestor.    Length of Stay: 6  Loralie Champagne, MD  03/12/2019, 9:36 AM  Advanced Heart Failure Team Pager [phone]  (M-F; [place])  Please contact [facility] for night-coverage after hours (4p -7a ) and weekends on [email or web address]

## 2019-03-12 NOTE — Progress Notes (Signed)
CRITICAL VALUE ALERT  Critical Value:  K+ 2.4  Date & Time Notied: 03/12/19 3335  Provider Notified: Trish Fountain  Orders Received/Actions taken: po K+ given

## 2019-03-12 NOTE — Progress Notes (Signed)
MD notified of elevated BG, look to results

## 2019-03-13 DIAGNOSIS — N179 Acute kidney failure, unspecified: Secondary | ICD-10-CM

## 2019-03-13 LAB — BASIC METABOLIC PANEL
Anion gap: 16 — ABNORMAL HIGH (ref 5–15)
BUN: 57 mg/dL — ABNORMAL HIGH (ref 8–23)
CO2: 39 mmol/L — ABNORMAL HIGH (ref 22–32)
Calcium: 8.2 mg/dL — ABNORMAL LOW (ref 8.9–10.3)
Chloride: 77 mmol/L — ABNORMAL LOW (ref 98–111)
Creatinine, Ser: 1.92 mg/dL — ABNORMAL HIGH (ref 0.61–1.24)
GFR calc Af Amer: 40 mL/min — ABNORMAL LOW (ref >60)
GFR calc non Af Amer: 35 mL/min — ABNORMAL LOW (ref >60)
Glucose, Bld: 493 mg/dL — ABNORMAL HIGH (ref 70–99)
Potassium: 3.2 mmol/L — ABNORMAL LOW (ref 3.5–5.1)
Sodium: 132 mmol/L — ABNORMAL LOW (ref 135–145)

## 2019-03-13 LAB — CBC WITH DIFFERENTIAL/PLATELET
Abs Immature Granulocytes: 0.02 10*3/uL (ref 0.00–0.07)
Basophils Absolute: 0 10*3/uL (ref 0.0–0.1)
Basophils Relative: 0 %
Eosinophils Absolute: 0 10*3/uL (ref 0.0–0.5)
Eosinophils Relative: 0 %
HCT: 26.5 % — ABNORMAL LOW (ref 39.0–52.0)
Hemoglobin: 7.9 g/dL — ABNORMAL LOW (ref 13.0–17.0)
Immature Granulocytes: 0 %
Lymphocytes Relative: 4 %
Lymphs Abs: 0.3 10*3/uL — ABNORMAL LOW (ref 0.7–4.0)
MCH: 25.2 pg — ABNORMAL LOW (ref 26.0–34.0)
MCHC: 29.8 g/dL — ABNORMAL LOW (ref 30.0–36.0)
MCV: 84.7 fL (ref 80.0–100.0)
Monocytes Absolute: 0.6 10*3/uL (ref 0.1–1.0)
Monocytes Relative: 8 %
Neutro Abs: 6.1 10*3/uL (ref 1.7–7.7)
Neutrophils Relative %: 88 %
Platelets: 379 10*3/uL (ref 150–400)
RBC: 3.13 MIL/uL — ABNORMAL LOW (ref 4.22–5.81)
RDW: 23.6 % — ABNORMAL HIGH (ref 11.5–15.5)
WBC: 7.1 10*3/uL (ref 4.0–10.5)
nRBC: 0.4 % — ABNORMAL HIGH (ref 0.0–0.2)

## 2019-03-13 LAB — CULTURE, BLOOD (ROUTINE X 2)
Culture: NO GROWTH
Culture: NO GROWTH
Special Requests: ADEQUATE

## 2019-03-13 LAB — GLUCOSE, CAPILLARY
Glucose-Capillary: 403 mg/dL — ABNORMAL HIGH (ref 70–99)
Glucose-Capillary: 385 mg/dL — ABNORMAL HIGH (ref 70–99)
Glucose-Capillary: 347 mg/dL — ABNORMAL HIGH (ref 70–99)
Glucose-Capillary: 293 mg/dL — ABNORMAL HIGH (ref 70–99)
Glucose-Capillary: 444 mg/dL — ABNORMAL HIGH (ref 70–99)

## 2019-03-13 LAB — COOXEMETRY PANEL
Carboxyhemoglobin: 2.1 % — ABNORMAL HIGH (ref 0.5–1.5)
Methemoglobin: 1.1 % (ref 0.0–1.5)
O2 Saturation: 64.3 %
Total hemoglobin: 8.3 g/dL — ABNORMAL LOW (ref 12.0–16.0)

## 2019-03-13 MED ORDER — PREDNISONE 20 MG PO TABS
40.0000 mg | ORAL_TABLET | Freq: Every day | ORAL | Status: DC
  Administered 2019-03-13 – 2019-03-14 (×2): 40 mg via ORAL
  Filled 2019-03-13 (×2): qty 2

## 2019-03-13 MED ORDER — INSULIN ASPART 100 UNIT/ML ~~LOC~~ SOLN
3.0000 [IU] | Freq: Three times a day (TID) | SUBCUTANEOUS | Status: DC
  Administered 2019-03-13 – 2019-03-16 (×8): 3 [IU] via SUBCUTANEOUS

## 2019-03-13 MED ORDER — POTASSIUM CHLORIDE CRYS ER 20 MEQ PO TBCR
40.0000 meq | EXTENDED_RELEASE_TABLET | Freq: Once | ORAL | Status: AC
  Administered 2019-03-13: 17:00:00 40 meq via ORAL
  Filled 2019-03-13: qty 2

## 2019-03-13 MED ORDER — INSULIN NPH (HUMAN) (ISOPHANE) 100 UNIT/ML ~~LOC~~ SUSP
25.0000 [IU] | Freq: Two times a day (BID) | SUBCUTANEOUS | Status: DC
  Administered 2019-03-13 – 2019-03-16 (×5): 25 [IU] via SUBCUTANEOUS
  Filled 2019-03-13: qty 10

## 2019-03-13 MED ORDER — MILRINONE LACTATE IN DEXTROSE 20-5 MG/100ML-% IV SOLN
0.1250 ug/kg/min | INTRAVENOUS | Status: DC
  Administered 2019-03-13 – 2019-03-20 (×18): 0.25 ug/kg/min via INTRAVENOUS
  Administered 2019-03-21 – 2019-03-22 (×2): 0.125 ug/kg/min via INTRAVENOUS
  Filled 2019-03-13 (×19): qty 100

## 2019-03-13 MED ORDER — ALTEPLASE 2 MG IJ SOLR
2.0000 mg | Freq: Once | INTRAMUSCULAR | Status: AC
  Administered 2019-03-13: 2 mg
  Filled 2019-03-13: qty 2

## 2019-03-13 MED ORDER — POTASSIUM CHLORIDE CRYS ER 20 MEQ PO TBCR
40.0000 meq | EXTENDED_RELEASE_TABLET | Freq: Once | ORAL | Status: AC
  Administered 2019-03-13: 40 meq via ORAL
  Filled 2019-03-13: qty 2

## 2019-03-13 MED ORDER — INSULIN ASPART 100 UNIT/ML ~~LOC~~ SOLN: 10.0000 [IU] | Freq: Once | SUBCUTANEOUS | Status: AC

## 2019-03-13 MED ORDER — METOLAZONE 2.5 MG PO TABS
2.5000 mg | ORAL_TABLET | Freq: Once | ORAL | Status: AC
  Administered 2019-03-13: 2.5 mg via ORAL
  Filled 2019-03-13: qty 1

## 2019-03-13 MED ORDER — ALTEPLASE 2 MG IJ SOLR
2.0000 mg | Freq: Once | INTRAMUSCULAR | Status: AC
  Administered 2019-03-13: 2 mg

## 2019-03-13 NOTE — Anesthesia Postprocedure Evaluation (Signed)
Anesthesia Post Note  Patient: Nicholas Caldwell  Procedure(s) Performed: ESOPHAGOGASTRODUODENOSCOPY (EGD) (Left ) BIOPSY COLONOSCOPY (N/A )     Patient location during evaluation: PACU Anesthesia Type: MAC Level of consciousness: awake and alert Pain management: pain level controlled Vital Signs Assessment: post-procedure vital signs reviewed and stable Respiratory status: spontaneous breathing, nonlabored ventilation, respiratory function stable and patient connected to nasal cannula oxygen Cardiovascular status: stable and blood pressure returned to baseline Postop Assessment: no apparent nausea or vomiting Anesthetic complications: no    Last Vitals:  Vitals:   03/13/19 1105 03/13/19 1604  BP: (!) 114/59 (!) 124/54  Pulse:  97  Resp:    Temp: 36.9 C 37.2 C  SpO2:  94%    Last Pain:  Vitals:   03/13/19 1743  TempSrc:   PainSc: 4                  Kayonna Lawniczak

## 2019-03-13 NOTE — Progress Notes (Signed)
[facility]                                                                              Patient Demographics  Nicholas Caldwell, is a 73 y.o. male, DOB - [birth year], VCB:[id number]  Admit date - 03/06/2019   Admitting Physician Etta Quill, DO  Outpatient Primary MD for the patient is [facility]  Outpatient specialists:    LOS - 7  days   Medical records reviewed and are as summarized below:    Chief Complaint  Patient presents with   Shortness of Breath       Brief summary   73 year old male with history of combined systolic and diastolic CHF with EF of 38-46% (per TEE in March), paroxysmal A. fib on Xarelto, COPD on 3 L home O2, noncompliance with inhalers with ongoing tobacco use, possibly diuretic, morbid obesity with OSA not adherent to CPAP presented with acute on chronic hypoxic respiratory failure secondary to acute on chronic combined systolic and diastolic CHF.  Patient had gross anasarca with pulmonary edema on chest x-ray and BNP of thousand.  Required nebs in the ED, higher oxygen and IV Lasix.  Admitted for aggressive IV diuresis.  Also found to have severe iron deficiency anemia.   Assessment & Plan    Principal Problem:   Acute on chronic respiratory failure with hypoxia and hypercapnia (HCC) -Likely secondary to acute on chronic combined systolic and diastolic CHF, COPD exacerbation -Improving  Active Problems:  Acute on chronic combined systolic and diastolic CHF.  Underlying nonischemic cardiomyopathy,  RV failure secondary to COPD, OSA, OHS, medication noncompliance -Still volume overloaded, anasarca -TEE in 3/20 with EF 40 to 45% with severely dilated RV and moderately decreased systolic function. -Continue beta-blocker, BiDil, spironolactone, follow creatinine closely  -Strict I's and O's, negative balance of 11.8 L L - weight on admission 265lbs->276-> today 267 -Continue IV Lasix drip, metolazone x1, cardiology  planning milrinone drip -Continue beta-blocker, BiDil, Aldactone.  Hypokalemia Potassium replaced   Acute COPD exacerbation -Wheezing better, transition to oral prednisone 40 mg daily -Currently on maximal treatments, nebs, Pulmicort 0.5 mg twice daily, Brovana -Continue flutter valve, PPI, doxycycline.   Severe iron deficiency anemia -Baseline hemoglobin around 8-9, hemoglobin 6.9 on 5/5, received 1 unit packed RBCs and Feraheme -Continue PPI.  -EGD showed localized area of severely erythematous and edematous and necrotic appearing mucosa in the gastric fundus and proximal gastric body, suspicious for malignancy, follow biopsy results.  Colonoscopy normal. Discussed with surgery, recommended to wait for biopsy before any management is planned, will also need oncology to weigh in prior to any surgical option. -Biopsy still pending, hemoglobin 7.1  Acute kidney injury -Creatinine trending up, 1.9 today likely due to diuresis  Essential hypertension BP stable, continue beta-blocker, BiDil, Aldactone, Lasix  Type 2 diabetes mellitus with neuropathy -CBG still elevated due to IV steroids. in 300's -Transition to oral prednisone -Increase NPH insulin to 25 units twice daily, added meal coverage 3 units 3 times daily AC, sliding scale insulin   Paroxysmal atrial fibrillation Rate controlled, Xarelto currently on hold   NSVT -Keep potassium above 4, aggressively replace,  magnesium normal Replacing potassium  Nicotine abuse Patient counseled on nicotine cessation, continue nicotine patch  Hyperlipidemia Continue statin   Code Status: Full code DVT Prophylaxis: SCD's, Xarelto on hold Family Communication: Discussed in detail with the patient, all imaging results, lab results explained to the patient.    Disposition Plan: Biopsy results pending, still volume overloaded on Lasix drip, high risk of decompensation.   Time Spent in minutes 25 minutes  Procedures:   EGD Colonoscopy  Consultants:   [facility]  Antimicrobials:   Anti-infectives (From admission, onward)   Start     Dose/Rate Route Frequency Ordered Stop   03/11/19 1000  doxycycline (VIBRA-TABS) tablet 100 mg     100 mg Oral Every 12 hours 03/11/19 0733     03/06/19 1945  ceFEPIme (MAXIPIME) 2 g in sodium chloride 0.9 % 100 mL IVPB     2 g 200 mL/hr over 30 Minutes Intravenous  Once 03/06/19 1941 03/06/19 2046   03/06/19 1945  vancomycin (VANCOCIN) 2,000 mg in sodium chloride 0.9 % 500 mL IVPB     2,000 mg 250 mL/hr over 120 Minutes Intravenous  Once 03/06/19 1941 03/07/19 0015         Medications  Scheduled Meds:  arformoterol  15 mcg Nebulization BID   bisoprolol  2.5 mg Oral Daily   budesonide (PULMICORT) nebulizer solution  0.5 mg Nebulization BID   docusate sodium  100 mg Oral Daily   doxycycline  100 mg Oral Q12H   ferrous sulfate  325 mg Oral BID WC   folic acid  1 mg Oral Daily   gabapentin  600 mg Oral BID   insulin aspart  0-15 Units Subcutaneous TID WC   insulin aspart  0-5 Units Subcutaneous QHS   insulin NPH Human  20 Units Subcutaneous BID AC & HS   ipratropium-albuterol  3 mL Nebulization TID   isosorbide-hydrALAZINE  1 tablet Oral TID   latanoprost  1 drop Both Eyes QHS   mouth rinse  15 mL Mouth Rinse BID   nicotine  7 mg Transdermal Daily   pantoprazole  40 mg Oral BID   peg 3350 powder  0.5 kit Oral Once   polyethylene glycol  17 g Oral Daily   potassium chloride  40 mEq Oral Daily   potassium chloride  40 mEq Oral Once   predniSONE  40 mg Oral QAC breakfast   rosuvastatin  20 mg Oral q1800   sodium chloride flush  10-40 mL Intracatheter Q12H   sodium chloride flush  3 mL Intravenous Q12H   spironolactone  25 mg Oral Daily   Continuous Infusions:  sodium chloride     furosemide (LASIX) infusion 15 mg/hr (03/13/19 1147)   milrinone 0.25 mcg/kg/min (03/13/19 1003)   PRN Meds:.sodium chloride,  acetaminophen, fluticasone, guaiFENesin, ondansetron (ZOFRAN) IV, oxyCODONE-acetaminophen, sodium chloride flush, sodium chloride flush      Subjective:   Nicholas Caldwell was seen and examined today.  Sitting up in the chair, feels a lot a lot better.  Awaiting the biopsy results, does not want wife to know yet.  No fevers or chills, coughing and bringing up phlegm.  No chest pain. patient denies dizziness, abdominal pain, N/V/D/C, new weakness, numbess, tingling. No acute events overnight.    Objective:   Vitals:   03/13/19 0500 03/13/19 0749 03/13/19 0923 03/13/19 1105  BP:  136/68  (!) 114/59  Pulse:      Resp:      Temp:  (!) 97.5  F (36.4 C)  98.5 F (36.9 C)  TempSrc:  Oral  Oral  SpO2:      Weight: 121.5 kg  121.2 kg   Height:        Intake/Output Summary (Last 24 hours) at 03/13/2019 1316 Last data filed at 03/13/2019 0930 Gross per 24 hour  Intake 1416.53 ml  Output 2350 ml  Net -933.47 ml     Wt Readings from Last 3 Encounters:  03/13/19 121.2 kg  01/24/19 115.8 kg  01/14/19 116.6 kg    Physical Exam  General: Alert and oriented x 3, NAD  Eyes:   HEENT:   Cardiovascular: S1 S2 clear, RRR. 1+ pedal edema b/l  Respiratory: scattered expiratory wheezing  Gastrointestinal: Soft, nontender, nondistended, NBS  Ext: 1+ pedal edema bilaterally  Neuro: no new deficits  Musculoskeletal: No cyanosis, clubbing  Skin: No rashes  Psych: Normal affect and demeanor, alert and oriented x3     Data Reviewed:  I have personally reviewed following labs and imaging studies  Micro Results Recent Results (from the past 240 hour(s))  SARS Coronavirus 2 (CEPHEID- Performed in [facility] hospital lab), Hosp Order     Status: None   Collection Time: 03/06/19  6:29 PM  Result Value Ref Range Status   SARS Coronavirus 2 NEGATIVE NEGATIVE Final    Comment: (NOTE) If result is NEGATIVE SARS-CoV-2 target nucleic acids are NOT DETECTED. The SARS-CoV-2 RNA is  generally detectable in upper and lower  respiratory specimens during the acute phase of infection. The lowest  concentration of SARS-CoV-2 viral copies this assay can detect is 250  copies / mL. A negative result does not preclude SARS-CoV-2 infection  and should not be used as the sole basis for treatment or other  patient management decisions.  A negative result may occur with  improper specimen collection / handling, submission of specimen other  than nasopharyngeal swab, presence of viral mutation(s) within the  areas targeted by this assay, and inadequate number of viral copies  (<250 copies / mL). A negative result must be combined with clinical  observations, patient history, and epidemiological information. If result is POSITIVE SARS-CoV-2 target nucleic acids are DETECTED. The SARS-CoV-2 RNA is generally detectable in upper and lower  respiratory specimens dur ing the acute phase of infection.  Positive  results are indicative of active infection with SARS-CoV-2.  Clinical  correlation with patient history and other diagnostic information is  necessary to determine patient infection status.  Positive results do  not rule out bacterial infection or co-infection with other viruses. If result is PRESUMPTIVE POSTIVE SARS-CoV-2 nucleic acids MAY BE PRESENT.   A presumptive positive result was obtained on the submitted specimen  and confirmed on repeat testing.  While 2019 novel coronavirus  (SARS-CoV-2) nucleic acids may be present in the submitted sample  additional confirmatory testing may be necessary for epidemiological  and / or clinical management purposes  to differentiate between  SARS-CoV-2 and other Sarbecovirus currently known to infect humans.  If clinically indicated additional testing with an alternate test  methodology [phone]) is advised. The SARS-CoV-2 RNA is generally  detectable in upper and lower respiratory sp ecimens during the acute  phase of  infection. The expected result is Negative. Fact Sheet for Patients:  StrictlyIdeas.no Fact Sheet for Healthcare Providers: BankingDealers.co.za This test is not yet approved or cleared by the [place] FDA and has been authorized for detection and/or diagnosis of SARS-CoV-2 by FDA under an Emergency Use Authorization (EUA).  This EUA will remain in effect (meaning this test can be used) for the duration of the COVID-19 declaration under Section 564(b)(1) of the Act, 21 U.S.C. section 360bbb-3(b)(1), unless the authorization is terminated or revoked sooner. Performed at [facility], [place] [address] [place], [place] [address]   Urine culture     Status: Abnormal   Collection Time: 03/06/19  6:29 PM  Result Value Ref Range Status   Specimen Description URINE, RANDOM  Final   Special Requests NONE  Final   Culture (A)  Final    <10,000 COLONIES/mL INSIGNIFICANT GROWTH Performed at [facility], [place] [address] [place], [place] [address]    Report Status 03/07/2019 FINAL  Final  Blood culture (routine x 2)     Status: Abnormal   Collection Time: 03/06/19  6:54 PM  Result Value Ref Range Status   Specimen Description BLOOD RIGHT ANTECUBITAL  Final   Special Requests   Final    BOTTLES DRAWN AEROBIC AND ANAEROBIC Blood Culture adequate volume   Culture  Setup Time   Final    GRAM POSITIVE COCCI IN BOTH AEROBIC AND ANAEROBIC BOTTLES CRITICAL RESULT CALLED TO, READ BACK BY AND VERIFIED WITH: PHARMD T DANG [id number] MLM    Culture (A)  Final    STAPHYLOCOCCUS SPECIES (COAGULASE NEGATIVE) THE SIGNIFICANCE OF ISOLATING THIS ORGANISM FROM A SINGLE SET OF BLOOD CULTURES WHEN MULTIPLE SETS ARE DRAWN IS UNCERTAIN. PLEASE NOTIFY THE MICROBIOLOGY DEPARTMENT WITHIN ONE WEEK IF SPECIATION AND SENSITIVITIES ARE REQUIRED. Performed at [facility], [place] [address], [place], [place] [address]    Report Status 03/09/2019 FINAL   Final  Blood Culture ID Panel (Reflexed)     Status: Abnormal   Collection Time: 03/06/19  6:54 PM  Result Value Ref Range Status   Enterococcus species NOT DETECTED NOT DETECTED Final   Listeria monocytogenes NOT DETECTED NOT DETECTED Final   Staphylococcus species DETECTED (A) NOT DETECTED Final    Comment: Methicillin (oxacillin) resistant coagulase negative staphylococcus. Possible blood culture contaminant (unless isolated from more than one blood culture draw or clinical case suggests pathogenicity). No antibiotic treatment is indicated for blood  culture contaminants. CRITICAL RESULT CALLED TO, READ BACK BY AND VERIFIED WITH: PHARMD T DANG [id number] MLM    Staphylococcus aureus (BCID) NOT DETECTED NOT DETECTED Final   Methicillin resistance DETECTED (A) NOT DETECTED Final    Comment: CRITICAL RESULT CALLED TO, READ BACK BY AND VERIFIED WITH: PHARMD T DANG [id number] MLM    Streptococcus species NOT DETECTED NOT DETECTED Final   Streptococcus agalactiae NOT DETECTED NOT DETECTED Final   Streptococcus pneumoniae NOT DETECTED NOT DETECTED Final   Streptococcus pyogenes NOT DETECTED NOT DETECTED Final   Acinetobacter baumannii NOT DETECTED NOT DETECTED Final   Enterobacteriaceae species NOT DETECTED NOT DETECTED Final   Enterobacter cloacae complex NOT DETECTED NOT DETECTED Final   Escherichia coli NOT DETECTED NOT DETECTED Final   Klebsiella oxytoca NOT DETECTED NOT DETECTED Final   Klebsiella pneumoniae NOT DETECTED NOT DETECTED Final   Proteus species NOT DETECTED NOT DETECTED Final   Serratia marcescens NOT DETECTED NOT DETECTED Final   Haemophilus influenzae NOT DETECTED NOT DETECTED Final   Neisseria meningitidis NOT DETECTED NOT DETECTED Final   Pseudomonas aeruginosa NOT DETECTED NOT DETECTED Final   Candida albicans NOT DETECTED NOT DETECTED Final   Candida glabrata NOT DETECTED NOT DETECTED Final   Candida krusei NOT DETECTED NOT DETECTED Final   Candida  parapsilosis NOT DETECTED NOT DETECTED Final   Candida tropicalis NOT DETECTED NOT DETECTED Final    Comment: Performed at [facility], [place] [address] [place], [place] [address]  Blood culture (routine x 2)     Status: None   Collection Time: 03/06/19  6:59 PM  Result Value Ref Range Status   Specimen Description BLOOD LEFT HAND  Final   Special Requests   Final    BOTTLES DRAWN AEROBIC AND ANAEROBIC Blood Culture results may not be optimal due to an inadequate volume of blood received in culture bottles   Culture   Final    NO GROWTH 5 DAYS Performed at [facility], [place] [address], [place], [place] [address]    Report Status 03/11/2019 FINAL  Final  Culture, blood (routine x 2)     Status: None   Collection Time: 03/07/19  6:42 PM  Result Value Ref Range Status   Specimen Description BLOOD RIGHT HAND  Final   Special Requests   Final    BOTTLES DRAWN AEROBIC AND ANAEROBIC Blood Culture adequate volume   Culture   Final    NO GROWTH 6 DAYS Performed at [facility], [place] [address] [place], [place] [address]    Report Status 03/13/2019 FINAL  Final  Culture, blood (routine x 2)     Status: None   Collection Time: 03/07/19  6:42 PM  Result Value Ref Range Status   Specimen Description BLOOD LEFT HAND  Final   Special Requests   Final    BOTTLES DRAWN AEROBIC ONLY Blood Culture results may not be optimal due to an inadequate volume of blood received in culture bottles   Culture   Final    NO GROWTH 6 DAYS Performed at [facility], [place] [address], [place], [place] [address]    Report Status 03/13/2019 FINAL  Final    Radiology Reports Dg Chest 2 View  Result Date: 03/06/2019 CLINICAL DATA:  Shortness of breath EXAM: CHEST - 2 VIEW COMPARISON:  01/11/2011 FINDINGS: Bilateral interstitial and alveolar airspace opacities. Small left pleural effusion. No pneumothorax. Stable cardiomegaly. No acute osseous abnormality. IMPRESSION: Bilateral interstitial and  alveolar airspace opacities with stable cardiomegaly. Differential considerations include pulmonary edema versus multilobar pneumonia. Electronically Signed   By: Kathreen Devoid   On: 03/06/2019 19:07   Dg Chest Port 1 View  Result Date: 03/08/2019 CLINICAL DATA:  Increased short of breath EXAM: PORTABLE CHEST 1 VIEW COMPARISON:  03/06/2019, 01/11/2019 FINDINGS: Cardiomegaly with vascular congestion and diffuse bilateral interstitial and ground-glass opacities suspicious for edema. Probable bilateral pleural effusions. Left basilar airspace consolidation. No pneumothorax. IMPRESSION: Cardiomegaly with worsening vascular congestion, pleural effusion, and interstitial and ground-glass opacities suspicious for edema. Left lung base consolidation could reflect atelectasis or pneumonia Electronically Signed   By: Donavan Foil M.D.   On: 03/08/2019 21:35   [place] Ekg Site Rite  Result Date: 03/09/2019 If Site Rite image not attached, placement could not be confirmed due to current cardiac rhythm.   Lab Data:  CBC: Recent Labs  Lab 03/09/19 0457 03/10/19 0539 03/11/19 0630 03/12/19 0217 03/13/19 0416  WBC 10.5 9.0 9.2 10.5 7.1  NEUTROABS 8.0* 8.6* 8.5* 8.3* 6.1  HGB 8.0* 8.0* 8.3* 8.4* 7.9*  HCT 28.0* 28.0* 27.8* 28.4* 26.5*  MCV 84.3 86.4 84.5 83.3 84.7  PLT 548* 501* 489* 447* 559   Basic Metabolic Panel: Recent Labs  Lab 03/08/19 0412  03/11/19 0630 03/11/19 1720 03/11/19  2113 03/12/19 0217 03/12/19 1541 03/13/19 0416  NA 140   < > 140 135  --  140 134* 132*  K 3.7   < > 2.6* 5.1  --  2.4* 3.5 3.2*  CL 92*   < > 82* 92*  --  82* 83* 77*  CO2 35*   < > 41* 34*  --  43* 38* 39*  GLUCOSE 114*   < > 293* 377* 398* 167* 283* 493*  BUN 27*   < > 49* 43*  --  51* 50* 57*  CREATININE 1.62*   < > 1.73* 1.56*  --  1.65* 1.64* 1.92*  CALCIUM 8.3*   < > 8.7* 7.2*  --  8.9 8.3* 8.2*  MG 2.3  --  2.2  --   --   --   --   --    < > = values in this interval not displayed.   GFR: Estimated  Creatinine Clearance: 48.1 mL/min (A) (by C-G formula based on SCr of 1.92 mg/dL (H)). Liver Function Tests: Recent Labs  Lab 03/06/19 1829  AST 16  ALT 12  ALKPHOS 120  BILITOT 0.4  PROT 7.4  ALBUMIN 3.2*   No results for input(s): LIPASE, AMYLASE in the last 168 hours. Recent Labs  Lab 03/06/19 1854  AMMONIA 24   Coagulation Profile: No results for input(s): INR, PROTIME in the last 168 hours. Cardiac Enzymes: Recent Labs  Lab 03/06/19 1829 03/06/19 2103  TROPONINI 0.05* 0.04*   BNP (last 3 results) No results for input(s): PROBNP in the last 8760 hours. HbA1C: No results for input(s): HGBA1C in the last 72 hours. CBG: Recent Labs  Lab 03/12/19 2147 03/12/19 2148 03/13/19 0627 03/13/19 0916 03/13/19 1104  GLUCAP 551* 505* 403* 385* 347*   Lipid Profile: No results for input(s): CHOL, HDL, LDLCALC, TRIG, CHOLHDL, LDLDIRECT in the last 72 hours. Thyroid Function Tests: No results for input(s): TSH, T4TOTAL, FREET4, T3FREE, THYROIDAB in the last 72 hours. Anemia Panel: No results for input(s): VITAMINB12, FOLATE, FERRITIN, TIBC, IRON, RETICCTPCT in the last 72 hours. Urine analysis:    Component Value Date/Time   COLORURINE COLORLESS (A) 03/06/2019 [place] 03/06/2019 1829   LABSPEC 1.006 03/06/2019 [place] 5.0 03/06/2019 1829   GLUCOSEU NEGATIVE 03/06/2019 1829   HGBUR NEGATIVE 03/06/2019 1829   BILIRUBINUR NEGATIVE 03/06/2019 1829   KETONESUR NEGATIVE 03/06/2019 1829   PROTEINUR NEGATIVE 03/06/2019 1829   UROBILINOGEN 0.2 10/26/2014 2206   NITRITE NEGATIVE 03/06/2019 1829   LEUKOCYTESUR NEGATIVE 03/06/2019 1829     Barrie Sigmund M.D. [facility] 03/13/2019, 1:16 PM  Pager: [phone] Between 7am to 7pm - call Pager - [phone]  After 7pm go to [email or web address] - password [email or web address]  Call night coverage person covering after 7pm

## 2019-03-13 NOTE — Progress Notes (Signed)
Patient ID: Nicholas Caldwell, male   DOB: [birth year], 73 y.o.   MRN: [id number]     Advanced Heart Failure Rounding Note  PCP-Cardiologist: No primary care provider on file.   Subjective:    Still short of breath but gradually improving.  CVP 21.  Creatinine continues to rise and did not have a particularly good diuresis yesterday.  Co-ox 64%.   CXR 5/5: Pulmonary edema, left base consolidation.  Afebrile.  He had 1 blood culture positive for suspected coagulase negative Staph (contaminant). Steroids started 5/6, now on doxycycline as well.   He has had 2 units PRBCs, hgb a little lower at 7.9 today, no overt bleeding.  Remains off Xarelto.  Colonoscopy 5/8 was unremarkable, but EGD showed necrotic-appearing mucosa gastric fundus/body, concern for gastric cancer.  Biopsies taken, anticoagulation not yet restarted.    He remains in atypical atrial flutter, rate 80s-100s.     Objective:   Weight Range: 121.5 kg Body mass index is 36.33 kg/m.   Vital Signs:   Temp:  [97.5 F (36.4 C)-98.7 F (37.1 C)] 97.5 F (36.4 C) (05/10 0749) Pulse Rate:  [79-119] 119 (05/09 1937) Resp:  [17-19] 19 (05/09 1728) BP: (101-136)/(57-68) 136/68 (05/10 0749) SpO2:  [93 %-96 %] 93 % (05/09 1937) Weight:  [121.5 kg] 121.5 kg (05/10 0500) Last BM Date: 03/11/19  Weight change: Filed Weights   03/11/19 0800 03/12/19 0500 03/13/19 0500  Weight: 119.4 kg 119.7 kg 121.5 kg    Intake/Output:   Intake/Output Summary (Last 24 hours) at 03/13/2019 0910 Last data filed at 03/13/2019 0749 Gross per 24 hour  Intake 1776.53 ml  Output 2800 ml  Net -1023.47 ml      Physical Exam    General: NAD Neck: JVP 16+ cm, no thyromegaly or thyroid nodule.  Lungs: Decreased BS at bases, no wheezing.  CV: Nondisplaced PMI.  Heart irregular S1/S2, no S3/S4, no murmur.  1+ edema 1/2 to knees bilaterally.   Abdomen: Soft, nontender, no hepatosplenomegaly, no distention.  Skin: Intact without lesions or rashes.   Neurologic: Alert and oriented x 3.  Psych: Normal affect. Extremities: No clubbing or cyanosis.  HEENT: Normal.    Telemetry   Atypical atrial flutter rate in 80s-100s (personally reviewed).   Labs    CBC Recent Labs    03/12/19 0217 03/13/19 0416  WBC 10.5 7.1  NEUTROABS 8.3* 6.1  HGB 8.4* 7.9*  HCT 28.4* 26.5*  MCV 83.3 84.7  PLT 447* 130   Basic Metabolic Panel Recent Labs    03/11/19 0630  03/12/19 1541 03/13/19 0416  NA 140   < > 134* 132*  K 2.6*   < > 3.5 3.2*  CL 82*   < > 83* 77*  CO2 41*   < > 38* 39*  GLUCOSE 293*   < > 283* 493*  BUN 49*   < > 50* 57*  CREATININE 1.73*   < > 1.64* 1.92*  CALCIUM 8.7*   < > 8.3* 8.2*  MG 2.2  --   --   --    < > = values in this interval not displayed.   Liver Function Tests No results for input(s): AST, ALT, ALKPHOS, BILITOT, PROT, ALBUMIN in the last 72 hours. No results for input(s): LIPASE, AMYLASE in the last 72 hours. Cardiac Enzymes No results for input(s): CKTOTAL, CKMB, CKMBINDEX, TROPONINI in the last 72 hours.  BNP: BNP (last 3 results) Recent Labs    12/01/18 0015 12/30/18 1621 03/06/19 1829  BNP 1,666.7* 1,273.0* 1,020.1*    ProBNP (last 3 results) No results for input(s): PROBNP in the last 8760 hours.   D-Dimer No results for input(s): DDIMER in the last 72 hours. Hemoglobin A1C No results for input(s): HGBA1C in the last 72 hours. Fasting Lipid Panel No results for input(s): CHOL, HDL, LDLCALC, TRIG, CHOLHDL, LDLDIRECT in the last 72 hours. Thyroid Function Tests No results for input(s): TSH, T4TOTAL, T3FREE, THYROIDAB in the last 72 hours.  Invalid input(s): FREET3  Other results:   Imaging    No results found.   Medications:     Scheduled Medications: . arformoterol  15 mcg Nebulization BID  . bisoprolol  2.5 mg Oral Daily  . budesonide (PULMICORT) nebulizer solution  0.5 mg Nebulization BID  . docusate sodium  100 mg Oral Daily  . doxycycline  100 mg Oral Q12H  .  ferrous sulfate  325 mg Oral BID WC  . folic acid  1 mg Oral Daily  . gabapentin  600 mg Oral BID  . insulin aspart  0-15 Units Subcutaneous TID WC  . insulin aspart  0-5 Units Subcutaneous QHS  . insulin NPH Human  20 Units Subcutaneous BID AC & HS  . ipratropium-albuterol  3 mL Nebulization TID  . isosorbide-hydrALAZINE  1 tablet Oral TID  . latanoprost  1 drop Both Eyes QHS  . mouth rinse  15 mL Mouth Rinse BID  . metolazone  2.5 mg Oral Once  . nicotine  7 mg Transdermal Daily  . pantoprazole  40 mg Oral BID  . peg 3350 powder  0.5 kit Oral Once  . polyethylene glycol  17 g Oral Daily  . potassium chloride  40 mEq Oral Daily  . potassium chloride  40 mEq Oral Once  . potassium chloride  40 mEq Oral Once  . predniSONE  40 mg Oral QAC breakfast  . rosuvastatin  20 mg Oral q1800  . sodium chloride flush  10-40 mL Intracatheter Q12H  . sodium chloride flush  3 mL Intravenous Q12H  . spironolactone  25 mg Oral Daily    Infusions: . sodium chloride    . furosemide (LASIX) infusion 15 mg/hr (03/13/19 0200)  . milrinone      PRN Medications: sodium chloride, acetaminophen, fluticasone, guaiFENesin, ondansetron (ZOFRAN) IV, oxyCODONE-acetaminophen, sodium chloride flush, sodium chloride flush   Assessment/Plan   1. Acute on chronic systolic CHF: Nonischemic cardiomyopathy.  With prominent RV failure, likely related to COPD/OSA/OHS. TEE in 3/20 with EF 40-45%, severely dilated RV with moderately decreased systolic function. He has had multiple recent admissions with CHF and COPD exacerbations.  He has not cooperated with paramedicine or [facility] home health as outpatient, has not been using CPAP per his wife, suspect significant medication noncompliance as well though he denies.  He remains volume overloaded with CVP 21 this morning, not diuresing well on current regimen.  Co-ox 64%.  Rise again in BUN/creatinine. - His co-ox is not particularly low but inadequate diuresis and suspect  significant RV dysfunction in setting of COPD and OHS/OSA.  I will try him on milrinone 0.25 mcg/kg/min today to see if diuresis improves and renal function stabilizes.  May need amiodarone gtt to keep HR controlled, will see.  - Continue Lasix 15 mg/hr and will give 2.5 mg po metolazone x 1.  He will need aggressive K repletion.   - Continue bisoprolol 2.5 daily.  - Continue Bidil 1 tab tid.  - Continue spironolactone 25 mg daily.    -  Cardiomems may be helpful as outpatient.  2. Anemia: Baseline hgb around 8.  6.7 at admission, denies overt GI bleeding.  He has had 2 unit PRBCs and IV Fe.  Hgb 7.9 today, lower but no overt bleeding.  Colonoscopy 5/8 unremarkable but EGD showed necrotic mucosa gastric fundus/body that is concerning for gastric cancer, biopsies taken.  - Hold Xarelto for now, ?restart Eliquis instead in future given history of GI bleeding. No anticoagulation yet based on appearance of gastric lesion pending biopsy results (hopefully will be back Monday).  Appreciate GI assistance.  3. Atrial fibrillation/flutter: Patient has history of paroxysmal atrial fibrillation.  This admission, he appears to be in an atypical atrial flutter.  Rate is currently controlled. Xarelto on hold with GI bleeding.  Was seen in past by Dr. Rayann Heman, not thought to be good ablation candidate.  He has not been on Tikosyn or amiodarone due to history of long QT.  - Would like to do TEE-guided DCCV, but will need to wait on GI guidance regarding restarting anticoagulation given the lesion in his stomach.   - Will eventually ask EP to reconsider ablation given lack of good anti-arrhythmic options. He seems to do better in NSR.  4. COPD: Severe, on 3L home oxygen.  He was wheezing initially but now much improved.  This may be due to pulmonary edema, but also consider component of COPD exacerbation.  - Getting Duonebs.  - He is now on prednisone and doxycycline 100 mg bid.   5. OHS/OSA: On home oxygen.  Per wife,  has not been using CPAP at home.  Will give CPAP at night here.  6. ID: Suspect this is primarily a CHF exacerbation.  Afebrile and WBCs not significantly elevated.  1 blood culture positive for what appears to be coagulase negative Staph (probably contaminant).  COVID-19 negative.  7. CAD: Nonobstructive on prior cath.  He is on Crestor.    Length of Stay: 7  Loralie Champagne, MD  03/13/2019, 9:10 AM  Advanced Heart Failure Team Pager [phone] (M-F; [place])  Please contact [facility] for night-coverage after hours (4p -7a ) and weekends on [email or web address]

## 2019-03-14 ENCOUNTER — Encounter (HOSPITAL_COMMUNITY): Payer: Self-pay | Admitting: Gastroenterology

## 2019-03-14 DIAGNOSIS — R198 Other specified symptoms and signs involving the digestive system and abdomen: Secondary | ICD-10-CM

## 2019-03-14 DIAGNOSIS — K2990 Gastroduodenitis, unspecified, without bleeding: Secondary | ICD-10-CM

## 2019-03-14 LAB — BASIC METABOLIC PANEL
Anion gap: 18 — ABNORMAL HIGH (ref 5–15)
Anion gap: 18 — ABNORMAL HIGH (ref 5–15)
BUN: 64 mg/dL — ABNORMAL HIGH (ref 8–23)
BUN: 69 mg/dL — ABNORMAL HIGH (ref 8–23)
CO2: 34 mmol/L — ABNORMAL HIGH (ref 22–32)
CO2: 37 mmol/L — ABNORMAL HIGH (ref 22–32)
Calcium: 7.9 mg/dL — ABNORMAL LOW (ref 8.9–10.3)
Calcium: 8.6 mg/dL — ABNORMAL LOW (ref 8.9–10.3)
Chloride: 84 mmol/L — ABNORMAL LOW (ref 98–111)
Chloride: 75 mmol/L — ABNORMAL LOW (ref 98–111)
Creatinine, Ser: 1.92 mg/dL — ABNORMAL HIGH (ref 0.61–1.24)
Creatinine, Ser: 2.09 mg/dL — ABNORMAL HIGH (ref 0.61–1.24)
GFR calc Af Amer: 40 mL/min — ABNORMAL LOW (ref >60)
GFR calc Af Amer: 36 mL/min — ABNORMAL LOW (ref >60)
GFR calc non Af Amer: 35 mL/min — ABNORMAL LOW (ref >60)
GFR calc non Af Amer: 31 mL/min — ABNORMAL LOW (ref >60)
Glucose, Bld: 282 mg/dL — ABNORMAL HIGH (ref 70–99)
Glucose, Bld: 211 mg/dL — ABNORMAL HIGH (ref 70–99)
Potassium: 2.8 mmol/L — ABNORMAL LOW (ref 3.5–5.1)
Potassium: 3.6 mmol/L (ref 3.5–5.1)
Sodium: 136 mmol/L (ref 135–145)
Sodium: 130 mmol/L — ABNORMAL LOW (ref 135–145)

## 2019-03-14 LAB — GLUCOSE, CAPILLARY
Glucose-Capillary: 551 mg/dL (ref 70–99)
Glucose-Capillary: 256 mg/dL — ABNORMAL HIGH (ref 70–99)
Glucose-Capillary: 338 mg/dL — ABNORMAL HIGH (ref 70–99)
Glucose-Capillary: 275 mg/dL — ABNORMAL HIGH (ref 70–99)
Glucose-Capillary: 198 mg/dL — ABNORMAL HIGH (ref 70–99)
Glucose-Capillary: 241 mg/dL — ABNORMAL HIGH (ref 70–99)

## 2019-03-14 LAB — MAGNESIUM: Magnesium: 1.9 mg/dL (ref 1.7–2.4)

## 2019-03-14 LAB — COOXEMETRY PANEL
Carboxyhemoglobin: 1.7 % — ABNORMAL HIGH (ref 0.5–1.5)
Methemoglobin: 1.9 % — ABNORMAL HIGH (ref 0.0–1.5)
O2 Saturation: 55.6 %
Total hemoglobin: 8.3 g/dL — ABNORMAL LOW (ref 12.0–16.0)

## 2019-03-14 MED ORDER — PREDNISONE 20 MG PO TABS
30.0000 mg | ORAL_TABLET | Freq: Every day | ORAL | Status: AC
  Administered 2019-03-15 – 2019-03-16 (×2): 30 mg via ORAL
  Filled 2019-03-14 (×2): qty 1

## 2019-03-14 MED ORDER — POTASSIUM CHLORIDE CRYS ER 20 MEQ PO TBCR
40.0000 meq | EXTENDED_RELEASE_TABLET | Freq: Three times a day (TID) | ORAL | Status: DC
  Administered 2019-03-14 – 2019-03-15 (×6): 40 meq via ORAL
  Filled 2019-03-14 (×6): qty 2

## 2019-03-14 MED ORDER — FERROUS SULFATE 325 (65 FE) MG PO TABS
325.0000 mg | ORAL_TABLET | Freq: Two times a day (BID) | ORAL | Status: DC
  Administered 2019-03-15 – 2019-03-31 (×32): 325 mg via ORAL
  Filled 2019-03-14 (×32): qty 1

## 2019-03-14 MED ORDER — POTASSIUM CHLORIDE 10 MEQ/100ML IV SOLN
10.0000 meq | INTRAVENOUS | Status: AC
  Administered 2019-03-14 (×4): 10 meq via INTRAVENOUS
  Filled 2019-03-14 (×4): qty 100

## 2019-03-14 MED ORDER — METOLAZONE 2.5 MG PO TABS
2.5000 mg | ORAL_TABLET | Freq: Once | ORAL | Status: AC
  Administered 2019-03-14: 2.5 mg via ORAL
  Filled 2019-03-14: qty 1

## 2019-03-14 NOTE — Progress Notes (Signed)
Patient ID: Nicholas Caldwell, male   DOB: 1945-12-08, 72 y.o.   MRN: [id number]     Advanced Heart Failure Rounding Note  PCP-Cardiologist: No primary care provider on file.   Subjective:    Milrinone started yesterday at 0.25. Also on lasix gtt at 15. Got one dose of metolazone yesterday. Co-ox 55%.  CVP 21 -> 18.  K 2.8 (being supped)  Says he feels ok. Did not walk at all over the weekend. Denies resting dyspnea, CP, orthopnea or PND. Remains AF.   He remains in atypical atrial flutter, rate 80s-100s.    Gastric biopsy results due today. Hgb 8.3 on co-ox    CXR 5/5: Pulmonary edema, left base consolidation.   He had 1 blood culture positive for suspected coagulase negative Staph (contaminant). Steroids started 5/6, now on doxycycline as well.    Objective:   Weight Range: 124.5 kg Body mass index is 37.23 kg/m.   Vital Signs:   Temp:  [97.6 F (36.4 C)-98.9 F (37.2 C)] 97.6 F (36.4 C) (05/11 0331) Pulse Rate:  [80-97] 87 (05/11 0331) Resp:  [15-22] 15 (05/11 0331) BP: (89-124)/(50-66) 114/59 (05/11 0331) SpO2:  [89 %-97 %] 96 % (05/11 0758) Weight:  [121.2 kg-124.5 kg] 124.5 kg (05/11 0529) Last BM Date: 03/11/19  Weight change: Filed Weights   03/13/19 0500 03/13/19 0923 03/14/19 0529  Weight: 121.5 kg 121.2 kg 124.5 kg    Intake/Output:   Intake/Output Summary (Last 24 hours) at 03/14/2019 0850 Last data filed at 03/14/2019 0715 Gross per 24 hour  Intake 1928.89 ml  Output 3475 ml  Net -1546.11 ml      Physical Exam    General:  Sitting in chair No resp difficulty HEENT: normal Neck: supple. JVP to ear  Carotids 2+ bilat; no bruits. No lymphadenopathy or thryomegaly appreciated. Cor: PMI nondisplaced. Irregular rate & rhythm. 2/6 TR Lungs: clear Abdomen: obese soft, nontender, mildly distended. No hepatosplenomegaly. No bruits or masses. Good bowel sounds. Extremities: no cyanosis, clubbing, rash, 2-3+ shiny edema Neuro: alert & orientedx3, cranial  nerves grossly intact. moves all 4 extremities w/o difficulty. Affect pleasant   Telemetry   Atypical atrial flutter rate in 80s-100s (personally reviewed).   Labs    CBC Recent Labs    03/12/19 0217 03/13/19 0416  WBC 10.5 7.1  NEUTROABS 8.3* 6.1  HGB 8.4* 7.9*  HCT 28.4* 26.5*  MCV 83.3 84.7  PLT 447* 923   Basic Metabolic Panel Recent Labs    03/13/19 0416 03/14/19 0506  NA 132* 136  K 3.2* 2.8*  CL 77* 84*  CO2 39* 34*  GLUCOSE 493* 282*  BUN 57* 64*  CREATININE 1.92* 1.92*  CALCIUM 8.2* 7.9*   Liver Function Tests No results for input(s): AST, ALT, ALKPHOS, BILITOT, PROT, ALBUMIN in the last 72 hours. No results for input(s): LIPASE, AMYLASE in the last 72 hours. Cardiac Enzymes No results for input(s): CKTOTAL, CKMB, CKMBINDEX, TROPONINI in the last 72 hours.  BNP: BNP (last 3 results) Recent Labs    12/01/18 0015 12/30/18 1621 03/06/19 1829  BNP 1,666.7* 1,273.0* 1,020.1*    ProBNP (last 3 results) No results for input(s): PROBNP in the last 8760 hours.   D-Dimer No results for input(s): DDIMER in the last 72 hours. Hemoglobin A1C No results for input(s): HGBA1C in the last 72 hours. Fasting Lipid Panel No results for input(s): CHOL, HDL, LDLCALC, TRIG, CHOLHDL, LDLDIRECT in the last 72 hours. Thyroid Function Tests No results for input(s): TSH, T4TOTAL,  T3FREE, THYROIDAB in the last 72 hours.  Invalid input(s): FREET3  Other results:   Imaging    No results found.   Medications:     Scheduled Medications: . arformoterol  15 mcg Nebulization BID  . bisoprolol  2.5 mg Oral Daily  . budesonide (PULMICORT) nebulizer solution  0.5 mg Nebulization BID  . docusate sodium  100 mg Oral Daily  . doxycycline  100 mg Oral Q12H  . ferrous sulfate  325 mg Oral BID WC  . folic acid  1 mg Oral Daily  . gabapentin  600 mg Oral BID  . insulin aspart  0-15 Units Subcutaneous TID WC  . insulin aspart  0-5 Units Subcutaneous QHS  . insulin  aspart  3 Units Subcutaneous TID WC  . insulin NPH Human  25 Units Subcutaneous BID AC & HS  . ipratropium-albuterol  3 mL Nebulization TID  . isosorbide-hydrALAZINE  1 tablet Oral TID  . latanoprost  1 drop Both Eyes QHS  . mouth rinse  15 mL Mouth Rinse BID  . nicotine  7 mg Transdermal Daily  . pantoprazole  40 mg Oral BID  . peg 3350 powder  0.5 kit Oral Once  . polyethylene glycol  17 g Oral Daily  . potassium chloride  40 mEq Oral TID  . predniSONE  40 mg Oral QAC breakfast  . rosuvastatin  20 mg Oral q1800  . sodium chloride flush  10-40 mL Intracatheter Q12H  . sodium chloride flush  3 mL Intravenous Q12H  . spironolactone  25 mg Oral Daily    Infusions: . sodium chloride    . furosemide (LASIX) infusion 15 mg/hr (03/13/19 1600)  . milrinone 0.25 mcg/kg/min (03/14/19 0042)  . potassium chloride 10 mEq (03/14/19 0837)    PRN Medications: sodium chloride, acetaminophen, fluticasone, guaiFENesin, ondansetron (ZOFRAN) IV, oxyCODONE-acetaminophen, sodium chloride flush, sodium chloride flush   Assessment/Plan   1. Acute on chronic systolic CHF: Nonischemic cardiomyopathy.  With prominent RV failure, likely related to COPD/OSA/OHS. TEE in 3/20 with EF 40-45%, severely dilated RV with moderately decreased systolic function. He has had multiple recent admissions with CHF and COPD exacerbations.  He has not cooperated with paramedicine or [facility] home [facility] as outpatient, has not been using CPAP per his wife, suspect significant medication noncompliance as well though he denies.   - Milrinone started 5/9. Now on 0.25 co-ox 55% - He remains on lasix drip with marked volume overload. CVP 21-> 18  - Continue Lasix 15 mg/hr and will give 2.5 mg po metolazone x 1 again today.  Continue aggressive K repletion.  Will recheck this afternoon. Discussed dosing with PharmD personally. - Place UNNA boots - Continue bisoprolol 2.5 daily.  - Continue Bidil 1 tab tid.  - Continue  spironolactone 25 mg daily.    - Cardiomems may be helpful as outpatient.  2. Anemia: Baseline hgb around 8.  6.7 at admission, denies overt GI bleeding.  He has had 2 unit PRBCs and IV Fe.  Hgb 7.9 today, lower but no overt bleeding.  Colonoscopy 5/8 unremarkable but EGD showed necrotic mucosa gastric fundus/body that is concerning for gastric cancer, biopsies taken.  - Hgb stable at 8.3. Pending gastric biopsy results today.  - Continue to hold Xarelto for now, ?restart Eliquis instead in future given history of GI bleeding. No anticoagulation yet based on appearance of gastric lesion pending biopsy results Appreciate GI assistance.  3. Atrial fibrillation/flutter: Patient has history of paroxysmal atrial fibrillation.  This admission,  he appears to be in an atypical atrial flutter.  Rate is currently controlled. Xarelto on hold with GI bleeding.  Was seen in past by Dr. Rayann Heman, not thought to be good ablation candidate.  He has not been on Tikosyn or amiodarone due to history of long QT.  - Would like to do TEE-guided DCCV, but will need to wait on GI guidance regarding restarting anticoagulation given the lesion in his stomach.   - Will eventually ask EP to reconsider ablation given lack of good anti-arrhythmic options. He seems to do better in NSR.  4. COPD: Severe, on 3L home oxygen.  He was wheezing initially but now much improved.  This may be due to pulmonary edema, but also consider component of COPD exacerbation.  - Getting Duonebs.  - He is now on prednisone and doxycycline 100 mg bid.   5. OHS/OSA: On home oxygen.  Per wife, has not been using CPAP at home.  Will give CPAP at night here.  6. ID: Suspect this is primarily a CHF exacerbation.  Afebrile and WBCs not significantly elevated.  1 blood culture positive for what appears to be coagulase negative Staph (probably contaminant).  COVID-19 negative.  7. CAD: Nonobstructive on prior cath.  No s/s ischemia. He is on Crestor.    Length  of Stay: [place], MD  03/14/2019, 8:50 AM  Advanced Heart Failure Team Pager [phone] (M-F; 7a - 4p)  Please contact [facility] for night-coverage after hours (4p -7a ) and weekends on [email or web address]

## 2019-03-14 NOTE — Progress Notes (Signed)
Physical Therapy Treatment Patient Details Name: Nicholas Caldwell MRN: [id number] DOB: Mar 09, 1946 Today's Date: 03/14/2019    History of Present Illness 73 yo male admitted with acute on chronic heart failure and respiratory failure. PMHx: CHF, chronic anemia, HLD, obesity, CAD, NICM, polyneuropathy, DM, COPD    PT Comments    Pt admitted with above diagnosis. Pt currently with functional limitations due to balance and endurance deficits. Pt was able to ambulate with RW and incr distance with min guard to min assist and did fatigue and need chair brought to him.  Progressing well.  Pt will benefit from skilled PT to increase their independence and safety with mobility to allow discharge to the venue listed below.     Follow Up Recommendations  Home health PT;Supervision/Assistance - 24 hour     Equipment Recommendations  None recommended by PT    Recommendations for Other Services       Precautions / Restrictions Precautions Precautions: Fall Restrictions Weight Bearing Restrictions: No    Mobility  Bed Mobility Overal bed mobility: Needs Assistance Bed Mobility: Supine to Sit     Supine to sit: Min assist     General bed mobility comments: Reaching for assist, but cued to use his UE's to assist and then only needed minimal truncal assist  Transfers Overall transfer level: Needs assistance Equipment used: Rolling walker (2 wheeled) Transfers: Sit to/from Stand Sit to Stand: Min assist;+2 safety/equipment Stand pivot transfers: Min assist;+2 physical assistance       General transfer comment: cues for hand placement, assist to come forward and up.  Ambulation/Gait Ambulation/Gait assistance: Min assist;+2 safety/equipment(for chair follow) Gait Distance (Feet): 115 Feet Assistive device: Rolling walker (2 wheeled) Gait Pattern/deviations: Step-through pattern;Decreased stride length Gait velocity: slower Gait velocity interpretation: <1.31 ft/sec, indicative of  household ambulator General Gait Details: mildly antalgic on the right, but more steady than suspected given he does not frequently walk.  Cues given for upright posture which pt was able to attain.  Pts LEs got tired therefore did have chair follow and pt rested after 115 feet and took him back to room in chair.    Stairs             Wheelchair Mobility    Modified Rankin (Stroke Patients Only)       Balance Overall balance assessment: Needs assistance Sitting-balance support: No upper extremity supported Sitting balance-Leahy Scale: Fair     Standing balance support: Bilateral upper extremity supported;During functional activity Standing balance-Leahy Scale: Poor Standing balance comment: relies on UE support on RW.                              Cognition Arousal/Alertness: Awake/alert Behavior During Therapy: WFL for tasks assessed/performed Overall Cognitive Status: Within Functional Limits for tasks assessed                                        Exercises General Exercises - Lower Extremity Ankle Circles/Pumps: AROM;Both;10 reps;Seated Long Arc Quad: AROM;Both;10 reps;Seated    General Comments General comments (skin integrity, edema, etc.): Pt on 6LO2 during treatment to keep sats >90%      Pertinent Vitals/Pain Pain Assessment: Faces Faces Pain Scale: Hurts little more Pain Location: right hip during gait Pain Descriptors / Indicators: Sore;Sharp Pain Intervention(s): Limited activity within patient's tolerance;Monitored during session;Repositioned  Home Living                      Prior Function            PT Goals (current goals can now be found in the care plan section) Acute Rehab PT Goals Patient Stated Goal: to go home Progress towards PT goals: Progressing toward goals    Frequency    Min 3X/week      PT Plan Current plan remains appropriate    Co-evaluation              AM-PAC PT "6  Clicks" Mobility   Outcome Measure  Help needed turning from your back to your side while in a flat bed without using bedrails?: None Help needed moving from lying on your back to sitting on the side of a flat bed without using bedrails?: A Little Help needed moving to and from a bed to a chair (including a wheelchair)?: A Little Help needed standing up from a chair using your arms (e.g., wheelchair or bedside chair)?: A Little Help needed to walk in hospital room?: A Little Help needed climbing 3-5 steps with a railing? : A Little 6 Click Score: 19    End of Session Equipment Utilized During Treatment: Gait belt;Oxygen(on 6LO2 ) Activity Tolerance: Patient tolerated treatment well;Patient limited by pain Patient left: with call bell/phone within reach;in bed;with bed alarm set(had already sat up today. ) Nurse Communication: Mobility status PT Visit Diagnosis: Unsteadiness on feet (R26.81);Muscle weakness (generalized) (M62.81)     Time: 1308-6578 PT Time Calculation (min) (ACUTE ONLY): 18 min  Charges:  $Gait Training: 8-22 mins                     [place] Pager:  [phone]  Office:  [place] 03/14/2019, 3:56 PM

## 2019-03-14 NOTE — H&P (View-Only) (Signed)
````````````````````````````````````````````````````````````````````````````````````````````````````````````````````````````````````````````````````````````````````````````````````````                                                             Daily Rounding Note  03/14/2019, 1:56 PM  LOS: 8 days   SUBJECTIVE:   Chief complaint: iron def anemia.  Proximal gastric lesion, r/o cancerous.      Tolerating HH diet.    OBJECTIVE:         Vital signs in last 24 hours:    Temp:  [97.6 F (36.4 C)-98.9 F (37.2 C)] 98.4 F (36.9 C) (05/11 1054) Pulse Rate:  [80-97] 85 (05/11 1054) Resp:  [15-24] 24 (05/11 1054) BP: (89-124)/(45-66) 111/51 (05/11 1054) SpO2:  [89 %-97 %] 96 % (05/11 1336) Weight:  [124.5 kg] 124.5 kg (05/11 0529) Last BM Date: 03/11/19 Filed Weights   03/13/19 0500 03/13/19 0923 03/14/19 0529  Weight: 121.5 kg 121.2 kg 124.5 kg   General: seen as he was slowly creeping along in hallway with 2 person PT and walker assist.  Obese and frail.  Looks older than stated age.   Heart: afib, low 100s.   Chest: coughing actively during activity Abdomen: obese.    Extremities: shiny LE edema.   Neuro/Psych:  Appropriate, oriented x 3.  Slow, unsteady gate.    Intake/Output from previous day: 05/10 0701 - 05/11 0700 In: 2288.9 [P.O.:1920; I.V.:368.9] Out: 3125 [Urine:3125]  Intake/Output this shift: Total I/O In: -  Out: 1000 [Urine:1000]  Lab Results: Recent Labs    03/12/19 0217 03/13/19 0416  WBC 10.5 7.1  HGB 8.4* 7.9*  HCT 28.4* 26.5*  PLT 447* 379   BMET Recent Labs    03/12/19 1541 03/13/19 0416 03/14/19 0506  NA 134* 132* 136  K 3.5 3.2* 2.8*  CL 83* 77* 84*  CO2 38* 39* 34*  GLUCOSE 283* 493* 282*  BUN 50* 57* 64*  CREATININE 1.64* 1.92* 1.92*  CALCIUM 8.3* 8.2* 7.9*   Scheduled Meds: . arformoterol  15 mcg Nebulization BID  . bisoprolol  2.5 mg Oral Daily  . budesonide (PULMICORT) nebulizer solution  0.5 mg Nebulization BID  . docusate  sodium  100 mg Oral Daily  . doxycycline  100 mg Oral Q12H  . ferrous sulfate  325 mg Oral BID WC  . folic acid  1 mg Oral Daily  . gabapentin  600 mg Oral BID  . insulin aspart  0-15 Units Subcutaneous TID WC  . insulin aspart  0-5 Units Subcutaneous QHS  . insulin aspart  3 Units Subcutaneous TID WC  . insulin NPH Human  25 Units Subcutaneous BID AC & HS  . ipratropium-albuterol  3 mL Nebulization TID  . isosorbide-hydrALAZINE  1 tablet Oral TID  . latanoprost  1 drop Both Eyes QHS  . mouth rinse  15 mL Mouth Rinse BID  . nicotine  7 mg Transdermal Daily  . pantoprazole  40 mg Oral BID  . peg 3350 powder  0.5 kit Oral Once  . polyethylene glycol  17 g Oral Daily  . potassium chloride  40 mEq Oral TID  . [START ON 03/15/2019] predniSONE  30 mg Oral QAC breakfast  . rosuvastatin  20 mg Oral q1800  . sodium chloride flush  10-40 mL Intracatheter Q12H  . sodium chloride flush  3 mL Intravenous   Q12H  . spironolactone  25 mg Oral Daily   Continuous Infusions: . sodium chloride    . furosemide (LASIX) infusion 15 mg/hr (03/14/19 1211)  . milrinone 0.25 mcg/kg/min (03/14/19 1220)   PRN Meds:.sodium chloride, acetaminophen, fluticasone, guaiFENesin, ondansetron (ZOFRAN) IV, oxyCODONE-acetaminophen, sodium chloride flush, sodium chloride flush   ASSESMENT:   *   IDA.  Hgb nadir 6.7.  S/p PRBCs x 2 on 5/4 - 5/5. Hgb stable with 0.4 gm range in last few days.   5/8 EGD edema, erythema, necrosis in prox stomach.  High liklihood for area to continue oozing especially in setting of AC.  Worrisome for malignancy.   5/8 Colonoscopy.  Poor prep with stool throughout colon,  ascending tics, non-bleeding int rrhoids. No blood or bleeding stigmata.     Path:  GASTRIC OXYNTIC MUCOSA SHOWING REACTIVE GASTROPATHY WITH SUBEPITHELIAL CRYSTALLINE IRON DEPOSITS, CONSISTENT WITH IRON PILL GASTROPATHY - WARTHIN-STARRY STAIN IS NEGATIVE FOR HELICOBACTER PYLORI   *   Chronic Xarelto for Afib/flutter, on  hold  *   CHF.  Non-ischemic CM, prominent RV failure in setting poor compliance with therapies for O2 dependent COPD/OSA/OHS.  EF 40%.  On milrinone, Lasxi gtt.    *   IDDM.  Poorly controlled.    *   Chronic pain syndrome.     PLAN   *  Continue BID PO Protonix.    *   relook EGD tmrw.      Azucena Freed  03/14/2019, 1:56 PM Phone [phone]

## 2019-03-14 NOTE — Care Management Important Message (Signed)
Important Message  Patient Details  Name: Nicholas Caldwell MRN: [id number] Date of Birth: Mar 25, 1946   Medicare Important Message Given:  Yes    Memory Argue 03/14/2019, 3:51 PM

## 2019-03-14 NOTE — Progress Notes (Signed)
Inpatient Diabetes Program Recommendations  AACE/ADA: New Consensus Statement on Inpatient Glycemic Control (2015)  Target Ranges:  Prepandial:   less than 140 mg/dL      Peak postprandial:   less than 180 mg/dL (1-2 hours)      Critically ill patients:  140 - 180 mg/dL   Lab Results  Component Value Date   GLUCAP 256 (H) 03/14/2019   HGBA1C 6.5 (H) 11/13/2018    Review of Glycemic Control Results for Nicholas Caldwell, Nicholas Caldwell (MRN [id number]) as of 03/14/2019 10:00  Ref. Range 03/13/2019 09:16 03/13/2019 11:04 03/13/2019 16:25 03/13/2019 21:28 03/14/2019 06:19  Glucose-Capillary Latest Ref Range: 70 - 99 mg/dL 385 (H) 347 (H) 293 (H) 444 (H) 256 (H)   HomeDM Meds: 70/30 Insulin- 30 units BID Metformin 1000 mgBID  Current Orders: NPH 25 units BID      Novolog 3 units tid meal coverage if eats 50% Novolog Moderate Correction Scale/ SSI (0-15 units) TID      AC + hs 0-5 units  Inpatient Diabetes Program Recommendations:   Patient received meal coverage @ [phone] but did not receive am Novolog correction. Spoke with RN Illa Level who plans to recheck CBG and cover with Novolog correction.  Please consider while steroids continued: -Increase NPH to 30 units bid -Increase Novolog meal coverage to 5 units tid if eats 50%  Thank you, [name] E. Tinsley Lomas, RN, MSN, CDE  Diabetes Coordinator Inpatient Glycemic Control Team Team Pager [phone] (8am-5pm) 03/14/2019 10:03 AM

## 2019-03-14 NOTE — Progress Notes (Signed)
At shift change IV team in pts room giving TPA in both lumens of PICC. New right arm IV was started by IV team IV medications were restarted without complication. Patient tolerated well.

## 2019-03-14 NOTE — Progress Notes (Signed)
[facility]                                                                              Patient Demographics  Nicholas Caldwell, is a 73 y.o. male, DOB - 10/01/1946, HGD:[id number]  Admit date - 03/06/2019   Admitting Physician Etta Quill, DO  Outpatient Primary MD for the patient is [facility]  Outpatient specialists:    LOS - 8  days   Medical records reviewed and are as summarized below:    Chief Complaint  Patient presents with  . Shortness of Breath       Brief summary   73 year old male with history of combined systolic and diastolic CHF with EF of 96-22% (per TEE in March), paroxysmal A. fib on Xarelto, COPD on 3 L home O2, noncompliance with inhalers with ongoing tobacco use, possibly diuretic, morbid obesity with OSA not adherent to CPAP presented with acute on chronic hypoxic respiratory failure secondary to acute on chronic combined systolic and diastolic CHF.  Patient had gross anasarca with pulmonary edema on chest x-ray and BNP of thousand.  Required nebs in the ED, higher oxygen and IV Lasix.  Admitted for aggressive IV diuresis.  Also found to have severe iron deficiency anemia.  Patient has been closely followed by CHF team, patient has been on Lasix drip with metolazone however still volume overloaded, milrinone drip started on 5/10.  Given severe iron deficiency anemia, patient underwent EGD and colonoscopy.  EGD shows erythematous necrotic area in the gastric fundus and proximal gastric body suspicious for malignancy.  Biopsy results still pending.   Assessment & Plan    Principal Problem:   Acute on chronic respiratory failure with hypoxia and hypercapnia (HCC) -Likely secondary to acute on chronic combined systolic and diastolic CHF, COPD exacerbation -Improving, sats 96% on 3 L.  Active Problems:  Acute on chronic combined systolic and diastolic CHF.  Underlying nonischemic cardiomyopathy,  RV failure secondary to COPD,  OSA, OHS, medication noncompliance -Still volume overloaded, anasarca -TEE in 3/20 with EF 40 to 45% with severely dilated RV and moderately decreased systolic function. -Patient has been on IV Lasix drip with metolazone, still volume overloaded, started on milrinone drip by CHF team on 5/10. -Continue beta-blocker, BiDil, Aldactone. -Strict I's and O's, negative balance of 12.9 L.  Hypokalemia Potassium 2.8, replaced p.o. and IV  Recheck potassium later today, on scheduled K, follow closely to avoid hyperkalemia  Acute COPD exacerbation -Wheezing slowly improving, taper oral prednisone to 30 mg daily from 5/12 -Currently on maximal treatments, nebs, Pulmicort 0.5 mg twice daily, Brovana -Continue flutter valve, PPI, doxycycline.   Severe iron deficiency anemia -Baseline hemoglobin around 8-9, hemoglobin 6.9 on 5/5, received 1 unit packed RBCs and Feraheme -Continue PPI.  -EGD showed localized area of severely erythematous and edematous and necrotic appearing mucosa in the gastric fundus and proximal gastric body, suspicious for malignancy, follow biopsy results.  Colonoscopy normal. Discussed with surgery, recommended to wait for biopsy before any management is planned, will also need oncology to weigh in prior to any surgical option. -Biopsy still pending, H&H stable  Acute kidney injury -Creatinine  trending up, 1.9, follow closely with diuresis  Essential hypertension BP stable, continue beta-blocker, BiDil, Aldactone, Lasix  Type 2 diabetes mellitus with neuropathy -CBG still somewhat elevated due to steroids.  Tapering oral prednisone. -Increase NPH insulin to 25 units twice daily, added meal coverage 3 units 3 times daily AC, sliding scale insulin   Paroxysmal atrial fibrillation Rate controlled, Xarelto currently on hold   NSVT -Keep potassium above 4, aggressively replace, magnesium normal Replacing potassium  Nicotine abuse Patient counseled on nicotine  cessation, continue nicotine patch  Hyperlipidemia Continue statin   Code Status: Full code DVT Prophylaxis: SCD's, Xarelto on hold Family Communication: Discussed in detail with the patient, all imaging results, lab results explained to the patient and patient's wife on the phone.    Disposition Plan: Biopsy results pending, still volume overloaded on Lasix drip and milrinone drip, high risk of decompensation.   Time Spent in minutes 25 minutes  Procedures:  EGD Colonoscopy  Consultants:   [facility]  Antimicrobials:   Anti-infectives (From admission, onward)   Start     Dose/Rate Route Frequency Ordered Stop   03/11/19 1000  doxycycline (VIBRA-TABS) tablet 100 mg     100 mg Oral Every 12 hours 03/11/19 0733     03/06/19 1945  ceFEPIme (MAXIPIME) 2 g in sodium chloride 0.9 % 100 mL IVPB     2 g 200 mL/hr over 30 Minutes Intravenous  Once 03/06/19 1941 03/06/19 2046   03/06/19 1945  vancomycin (VANCOCIN) 2,000 mg in sodium chloride 0.9 % 500 mL IVPB     2,000 mg 250 mL/hr over 120 Minutes Intravenous  Once 03/06/19 1941 03/07/19 0015         Medications  Scheduled Meds: . arformoterol  15 mcg Nebulization BID  . bisoprolol  2.5 mg Oral Daily  . budesonide (PULMICORT) nebulizer solution  0.5 mg Nebulization BID  . docusate sodium  100 mg Oral Daily  . doxycycline  100 mg Oral Q12H  . ferrous sulfate  325 mg Oral BID WC  . folic acid  1 mg Oral Daily  . gabapentin  600 mg Oral BID  . insulin aspart  0-15 Units Subcutaneous TID WC  . insulin aspart  0-5 Units Subcutaneous QHS  . insulin aspart  3 Units Subcutaneous TID WC  . insulin NPH Human  25 Units Subcutaneous BID AC & HS  . ipratropium-albuterol  3 mL Nebulization TID  . isosorbide-hydrALAZINE  1 tablet Oral TID  . latanoprost  1 drop Both Eyes QHS  . mouth rinse  15 mL Mouth Rinse BID  . nicotine  7 mg Transdermal Daily  . pantoprazole  40 mg Oral BID  . peg 3350 powder  0.5 kit Oral Once   . polyethylene glycol  17 g Oral Daily  . potassium chloride  40 mEq Oral TID  . predniSONE  40 mg Oral QAC breakfast  . rosuvastatin  20 mg Oral q1800  . sodium chloride flush  10-40 mL Intracatheter Q12H  . sodium chloride flush  3 mL Intravenous Q12H  . spironolactone  25 mg Oral Daily   Continuous Infusions: . sodium chloride    . furosemide (LASIX) infusion 15 mg/hr (03/14/19 1211)  . milrinone 0.25 mcg/kg/min (03/14/19 1220)   PRN Meds:.sodium chloride, acetaminophen, fluticasone, guaiFENesin, ondansetron (ZOFRAN) IV, oxyCODONE-acetaminophen, sodium chloride flush, sodium chloride flush      Subjective:   Suyash Harten was seen and examined today.  Sitting up in the chair, still has   mild wheezing and coughing.  No fevers or chills, still has peripheral edema.  No chest pain. patient denies dizziness, abdominal pain, N/V/D/C, new weakness, numbess, tingling. No acute events overnight.    Objective:   Vitals:   03/14/19 0756 03/14/19 0758 03/14/19 0850 03/14/19 1054  BP:   (!) 109/45 (!) 111/51  Pulse:    85  Resp:    (!) 24  Temp:   98.3 F (36.8 C) 98.4 F (36.9 C)  TempSrc:   Axillary Oral  SpO2: 95% 96% 95% 90%  Weight:      Height:        Intake/Output Summary (Last 24 hours) at 03/14/2019 1308 Last data filed at 03/14/2019 0715 Gross per 24 hour  Intake 1448.89 ml  Output 3075 ml  Net -1626.11 ml     Wt Readings from Last 3 Encounters:  03/14/19 124.5 kg  01/24/19 115.8 kg  01/14/19 116.6 kg   Physical Exam  General: Alert and oriented x 3, NAD  Eyes:   HEENT:  Atraumatic, normocephalic  Cardiovascular: S1 S2 clear, no murmurs, RRR.  1+ pedal edema b/l  Respiratory: Bilateral expiratory wheezing  Gastrointestinal: Soft, nontender, nondistended, NBS  Ext: 1+ pedal edema bilaterally  Neuro: no new deficits  Musculoskeletal: No cyanosis, clubbing  Skin: No rashes  Psych: Normal affect and demeanor, alert and oriented x3       Data  Reviewed:  I have personally reviewed following labs and imaging studies  Micro Results Recent Results (from the past 240 hour(s))  SARS Coronavirus 2 (CEPHEID- Performed in [place] hospital lab), Hosp Order     Status: None   Collection Time: 03/06/19  6:29 PM  Result Value Ref Range Status   SARS Coronavirus 2 NEGATIVE NEGATIVE Final    Comment: (NOTE) If result is NEGATIVE SARS-CoV-2 target nucleic acids are NOT DETECTED. The SARS-CoV-2 RNA is generally detectable in upper and lower  respiratory specimens during the acute phase of infection. The lowest  concentration of SARS-CoV-2 viral copies this assay can detect is 250  copies / mL. A negative result does not preclude SARS-CoV-2 infection  and should not be used as the sole basis for treatment or other  patient management decisions.  A negative result may occur with  improper specimen collection / handling, submission of specimen other  than nasopharyngeal swab, presence of viral mutation(s) within the  areas targeted by this assay, and inadequate number of viral copies  (<250 copies / mL). A negative result must be combined with clinical  observations, patient history, and epidemiological information. If result is POSITIVE SARS-CoV-2 target nucleic acids are DETECTED. The SARS-CoV-2 RNA is generally detectable in upper and lower  respiratory specimens dur ing the acute phase of infection.  Positive  results are indicative of active infection with SARS-CoV-2.  Clinical  correlation with patient history and other diagnostic information is  necessary to determine patient infection status.  Positive results do  not rule out bacterial infection or co-infection with other viruses. If result is PRESUMPTIVE POSTIVE SARS-CoV-2 nucleic acids MAY BE PRESENT.   A presumptive positive result was obtained on the submitted specimen  and confirmed on repeat testing.  While 2019 novel coronavirus  (SARS-CoV-2) nucleic acids may be  present in the submitted sample  additional confirmatory testing may be necessary for epidemiological  and / or clinical management purposes  to differentiate between  SARS-CoV-2 and other Sarbecovirus currently known to infect humans.  If clinically indicated additional testing with   an alternate test  methodology [phone]) is advised. The SARS-CoV-2 RNA is generally  detectable in upper and lower respiratory sp ecimens during the acute  phase of infection. The expected result is Negative. Fact Sheet for Patients:  StrictlyIdeas.no Fact Sheet for Healthcare Providers: BankingDealers.co.za This test is not yet approved or cleared by the [place] FDA and has been authorized for detection and/or diagnosis of SARS-CoV-2 by FDA under an Emergency Use Authorization (EUA).  This EUA will remain in effect (meaning this test can be used) for the duration of the COVID-19 declaration under Section 564(b)(1) of the Act, 21 U.S.C. section 360bbb-3(b)(1), unless the authorization is terminated or revoked sooner. Performed at [facility], [place] [address], [place], [place] [address]   Urine culture     Status: Abnormal   Collection Time: 03/06/19  6:29 PM  Result Value Ref Range Status   Specimen Description URINE, RANDOM  Final   Special Requests NONE  Final   Culture (A)  Final    <10,000 COLONIES/mL INSIGNIFICANT GROWTH Performed at [facility], [place] [address], [place], [place] [address]    Report Status 03/07/2019 FINAL  Final  Blood culture (routine x 2)     Status: Abnormal   Collection Time: 03/06/19  6:54 PM  Result Value Ref Range Status   Specimen Description BLOOD RIGHT ANTECUBITAL  Final   Special Requests   Final    BOTTLES DRAWN AEROBIC AND ANAEROBIC Blood Culture adequate volume   Culture  Setup Time   Final    GRAM POSITIVE COCCI IN BOTH AEROBIC AND ANAEROBIC BOTTLES CRITICAL RESULT CALLED TO, READ BACK BY AND  VERIFIED WITH: PHARMD T DANG [id number] MLM    Culture (A)  Final    STAPHYLOCOCCUS SPECIES (COAGULASE NEGATIVE) THE SIGNIFICANCE OF ISOLATING THIS ORGANISM FROM A SINGLE SET OF BLOOD CULTURES WHEN MULTIPLE SETS ARE DRAWN IS UNCERTAIN. PLEASE NOTIFY THE MICROBIOLOGY DEPARTMENT WITHIN ONE WEEK IF SPECIATION AND SENSITIVITIES ARE REQUIRED. Performed at [facility], [place] [address] [place], [place] [address]    Report Status 03/09/2019 FINAL  Final  Blood Culture ID Panel (Reflexed)     Status: Abnormal   Collection Time: 03/06/19  6:54 PM  Result Value Ref Range Status   Enterococcus species NOT DETECTED NOT DETECTED Final   Listeria monocytogenes NOT DETECTED NOT DETECTED Final   Staphylococcus species DETECTED (A) NOT DETECTED Final    Comment: Methicillin (oxacillin) resistant coagulase negative staphylococcus. Possible blood culture contaminant (unless isolated from more than one blood culture draw or clinical case suggests pathogenicity). No antibiotic treatment is indicated for blood  culture contaminants. CRITICAL RESULT CALLED TO, READ BACK BY AND VERIFIED WITH: PHARMD T DANG [id number] MLM    Staphylococcus aureus (BCID) NOT DETECTED NOT DETECTED Final   Methicillin resistance DETECTED (A) NOT DETECTED Final    Comment: CRITICAL RESULT CALLED TO, READ BACK BY AND VERIFIED WITH: PHARMD T DANG [id number] MLM    Streptococcus species NOT DETECTED NOT DETECTED Final   Streptococcus agalactiae NOT DETECTED NOT DETECTED Final   Streptococcus pneumoniae NOT DETECTED NOT DETECTED Final   Streptococcus pyogenes NOT DETECTED NOT DETECTED Final   Acinetobacter baumannii NOT DETECTED NOT DETECTED Final   Enterobacteriaceae species NOT DETECTED NOT DETECTED Final   Enterobacter cloacae complex NOT DETECTED NOT DETECTED Final   Escherichia coli NOT DETECTED NOT DETECTED Final   Klebsiella oxytoca NOT DETECTED NOT DETECTED Final   Klebsiella pneumoniae NOT DETECTED  NOT DETECTED  Final   Proteus species NOT DETECTED NOT DETECTED Final   Serratia marcescens NOT DETECTED NOT DETECTED Final   Haemophilus influenzae NOT DETECTED NOT DETECTED Final   Neisseria meningitidis NOT DETECTED NOT DETECTED Final   Pseudomonas aeruginosa NOT DETECTED NOT DETECTED Final   Candida albicans NOT DETECTED NOT DETECTED Final   Candida glabrata NOT DETECTED NOT DETECTED Final   Candida krusei NOT DETECTED NOT DETECTED Final   Candida parapsilosis NOT DETECTED NOT DETECTED Final   Candida tropicalis NOT DETECTED NOT DETECTED Final    Comment: Performed at [facility], [place] [address], [place], [place] [address]  Blood culture (routine x 2)     Status: None   Collection Time: 03/06/19  6:59 PM  Result Value Ref Range Status   Specimen Description BLOOD LEFT HAND  Final   Special Requests   Final    BOTTLES DRAWN AEROBIC AND ANAEROBIC Blood Culture results may not be optimal due to an inadequate volume of blood received in culture bottles   Culture   Final    NO GROWTH 5 DAYS Performed at [facility], [place] [address] [place], [place] [address]    Report Status 03/11/2019 FINAL  Final  Culture, blood (routine x 2)     Status: None   Collection Time: 03/07/19  6:42 PM  Result Value Ref Range Status   Specimen Description BLOOD RIGHT HAND  Final   Special Requests   Final    BOTTLES DRAWN AEROBIC AND ANAEROBIC Blood Culture adequate volume   Culture   Final    NO GROWTH 6 DAYS Performed at [facility], [place] [address] [place], [place] [address]    Report Status 03/13/2019 FINAL  Final  Culture, blood (routine x 2)     Status: None   Collection Time: 03/07/19  6:42 PM  Result Value Ref Range Status   Specimen Description BLOOD LEFT HAND  Final   Special Requests   Final    BOTTLES DRAWN AEROBIC ONLY Blood Culture results may not be optimal due to an inadequate volume of blood received in culture bottles   Culture   Final    NO GROWTH 6 DAYS Performed at  [facility], [place] [address], [place], [place] [address]    Report Status 03/13/2019 FINAL  Final    Radiology Reports Dg Chest 2 View  Result Date: 03/06/2019 CLINICAL DATA:  Shortness of breath EXAM: CHEST - 2 VIEW COMPARISON:  01/11/2011 FINDINGS: Bilateral interstitial and alveolar airspace opacities. Small left pleural effusion. No pneumothorax. Stable cardiomegaly. No acute osseous abnormality. IMPRESSION: Bilateral interstitial and alveolar airspace opacities with stable cardiomegaly. Differential considerations include pulmonary edema versus multilobar pneumonia. Electronically Signed   By: Kathreen Devoid   On: 03/06/2019 19:07   Dg Chest Port 1 View  Result Date: 03/08/2019 CLINICAL DATA:  Increased short of breath EXAM: PORTABLE CHEST 1 VIEW COMPARISON:  03/06/2019, 01/11/2019 FINDINGS: Cardiomegaly with vascular congestion and diffuse bilateral interstitial and ground-glass opacities suspicious for edema. Probable bilateral pleural effusions. Left basilar airspace consolidation. No pneumothorax. IMPRESSION: Cardiomegaly with worsening vascular congestion, pleural effusion, and interstitial and ground-glass opacities suspicious for edema. Left lung base consolidation could reflect atelectasis or pneumonia Electronically Signed   By: Donavan Foil M.D.   On: 03/08/2019 21:35   [place] Ekg Site Rite  Result Date: 03/09/2019 If Site Rite image not attached, placement could not be confirmed due to current cardiac rhythm.  Lab Data:  CBC: Recent Labs  Lab 03/09/19 0457 03/10/19 0539 03/11/19 0630 03/12/19 0217 03/13/19 0416  WBC 10.5 9.0 9.2 10.5 7.1  NEUTROABS 8.0* 8.6* 8.5* 8.3* 6.1  HGB 8.0* 8.0* 8.3* 8.4* 7.9*  HCT 28.0* 28.0* 27.8* 28.4* 26.5*  MCV 84.3 86.4 84.5 83.3 84.7  PLT 548* 501* 489* 447* 047   Basic Metabolic Panel: Recent Labs  Lab 03/08/19 0412  03/11/19 0630 03/11/19 1720 03/11/19 2113 03/12/19 0217 03/12/19 1541 03/13/19 0416 03/14/19 0506 03/14/19  0734  NA 140   < > 140 135  --  140 134* 132* 136  --   K 3.7   < > 2.6* 5.1  --  2.4* 3.5 3.2* 2.8*  --   CL 92*   < > 82* 92*  --  82* 83* 77* 84*  --   CO2 35*   < > 41* 34*  --  43* 38* 39* 34*  --   GLUCOSE 114*   < > 293* 377* 398* 167* 283* 493* 282*  --   BUN 27*   < > 49* 43*  --  51* 50* 57* 64*  --   CREATININE 1.62*   < > 1.73* 1.56*  --  1.65* 1.64* 1.92* 1.92*  --   CALCIUM 8.3*   < > 8.7* 7.2*  --  8.9 8.3* 8.2* 7.9*  --   MG 2.3  --  2.2  --   --   --   --   --   --  1.9   < > = values in this interval not displayed.   GFR: Estimated Creatinine Clearance: 48.8 mL/min (A) (by C-G formula based on SCr of 1.92 mg/dL (H)). Liver Function Tests: No results for input(s): AST, ALT, ALKPHOS, BILITOT, PROT, ALBUMIN in the last 168 hours. No results for input(s): LIPASE, AMYLASE in the last 168 hours. No results for input(s): AMMONIA in the last 168 hours. Coagulation Profile: No results for input(s): INR, PROTIME in the last 168 hours. Cardiac Enzymes: No results for input(s): CKTOTAL, CKMB, CKMBINDEX, TROPONINI in the last 168 hours. BNP (last 3 results) No results for input(s): PROBNP in the last 8760 hours. HbA1C: No results for input(s): HGBA1C in the last 72 hours. CBG: Recent Labs  Lab 03/13/19 1625 03/13/19 2128 03/14/19 0619 03/14/19 1006 03/14/19 1106  GLUCAP 293* 444* 256* 338* 275*   Lipid Profile: No results for input(s): CHOL, HDL, LDLCALC, TRIG, CHOLHDL, LDLDIRECT in the last 72 hours. Thyroid Function Tests: No results for input(s): TSH, T4TOTAL, FREET4, T3FREE, THYROIDAB in the last 72 hours. Anemia Panel: No results for input(s): VITAMINB12, FOLATE, FERRITIN, TIBC, IRON, RETICCTPCT in the last 72 hours. Urine analysis:    Component Value Date/Time   COLORURINE COLORLESS (A) 03/06/2019 [place] 03/06/2019 1829   LABSPEC 1.006 03/06/2019 [place] 5.0 03/06/2019 1829   GLUCOSEU NEGATIVE 03/06/2019 1829   HGBUR NEGATIVE  03/06/2019 1829   BILIRUBINUR NEGATIVE 03/06/2019 1829   KETONESUR NEGATIVE 03/06/2019 1829   PROTEINUR NEGATIVE 03/06/2019 1829   UROBILINOGEN 0.2 10/26/2014 2206   NITRITE NEGATIVE 03/06/2019 1829   LEUKOCYTESUR NEGATIVE 03/06/2019 1829     Ripudeep Rai M.D. [facility] 03/14/2019, 1:08 PM  Pager: [phone] Between 7am to 7pm - call Pager - [phone]  After 7pm go to [email or web address] - password [email or web address]  Call night coverage person covering after 7pm

## 2019-03-14 NOTE — Progress Notes (Addendum)
````````````````````````````````````````````````````````````````````````````````````````````````````````````````````````````````````````````````````````````````````````````````````````                                                             Daily Rounding Note  03/14/2019, 1:56 PM  LOS: 8 days   SUBJECTIVE:   Chief complaint: iron def anemia.  Proximal gastric lesion, r/o cancerous.      Tolerating HH diet.    OBJECTIVE:         Vital signs in last 24 hours:    Temp:  [97.6 F (36.4 C)-98.9 F (37.2 C)] 98.4 F (36.9 C) (05/11 1054) Pulse Rate:  [80-97] 85 (05/11 1054) Resp:  [15-24] 24 (05/11 1054) BP: (89-124)/(45-66) 111/51 (05/11 1054) SpO2:  [89 %-97 %] 96 % (05/11 1336) Weight:  [124.5 kg] 124.5 kg (05/11 0529) Last BM Date: 03/11/19 Filed Weights   03/13/19 0500 03/13/19 0923 03/14/19 0529  Weight: 121.5 kg 121.2 kg 124.5 kg   General: seen as he was slowly creeping along in hallway with 2 person PT and walker assist.  Obese and frail.  Looks older than stated age.   Heart: afib, low 100s.   Chest: coughing actively during activity Abdomen: obese.    Extremities: shiny LE edema.   Neuro/Psych:  Appropriate, oriented x 3.  Slow, unsteady gate.    Intake/Output from previous day: 05/10 0701 - 05/11 0700 In: 2288.9 [P.O.:1920; I.V.:368.9] Out: 3125 [Urine:3125]  Intake/Output this shift: Total I/O In: -  Out: 1000 [Urine:1000]  Lab Results: Recent Labs    03/12/19 0217 03/13/19 0416  WBC 10.5 7.1  HGB 8.4* 7.9*  HCT 28.4* 26.5*  PLT 447* 379   BMET Recent Labs    03/12/19 1541 03/13/19 0416 03/14/19 0506  NA 134* 132* 136  K 3.5 3.2* 2.8*  CL 83* 77* 84*  CO2 38* 39* 34*  GLUCOSE 283* 493* 282*  BUN 50* 57* 64*  CREATININE 1.64* 1.92* 1.92*  CALCIUM 8.3* 8.2* 7.9*   Scheduled Meds: . arformoterol  15 mcg Nebulization BID  . bisoprolol  2.5 mg Oral Daily  . budesonide (PULMICORT) nebulizer solution  0.5 mg Nebulization BID  . docusate  sodium  100 mg Oral Daily  . doxycycline  100 mg Oral Q12H  . ferrous sulfate  325 mg Oral BID WC  . folic acid  1 mg Oral Daily  . gabapentin  600 mg Oral BID  . insulin aspart  0-15 Units Subcutaneous TID WC  . insulin aspart  0-5 Units Subcutaneous QHS  . insulin aspart  3 Units Subcutaneous TID WC  . insulin NPH Human  25 Units Subcutaneous BID AC & HS  . ipratropium-albuterol  3 mL Nebulization TID  . isosorbide-hydrALAZINE  1 tablet Oral TID  . latanoprost  1 drop Both Eyes QHS  . mouth rinse  15 mL Mouth Rinse BID  . nicotine  7 mg Transdermal Daily  . pantoprazole  40 mg Oral BID  . peg 3350 powder  0.5 kit Oral Once  . polyethylene glycol  17 g Oral Daily  . potassium chloride  40 mEq Oral TID  . [START ON 03/15/2019] predniSONE  30 mg Oral QAC breakfast  . rosuvastatin  20 mg Oral q1800  . sodium chloride flush  10-40 mL Intracatheter Q12H  . sodium chloride flush  3 mL Intravenous   Q12H  . spironolactone  25 mg Oral Daily   Continuous Infusions: . sodium chloride    . furosemide (LASIX) infusion 15 mg/hr (03/14/19 1211)  . milrinone 0.25 mcg/kg/min (03/14/19 1220)   PRN Meds:.sodium chloride, acetaminophen, fluticasone, guaiFENesin, ondansetron (ZOFRAN) IV, oxyCODONE-acetaminophen, sodium chloride flush, sodium chloride flush   ASSESMENT:   *   IDA.  Hgb nadir 6.7.  S/p PRBCs x 2 on 5/4 - 5/5. Hgb stable with 0.4 gm range in last few days.   5/8 EGD edema, erythema, necrosis in prox stomach.  High liklihood for area to continue oozing especially in setting of AC.  Worrisome for malignancy.   5/8 Colonoscopy.  Poor prep with stool throughout colon,  ascending tics, non-bleeding int rrhoids. No blood or bleeding stigmata.     Path:  GASTRIC OXYNTIC MUCOSA SHOWING REACTIVE GASTROPATHY WITH SUBEPITHELIAL CRYSTALLINE IRON DEPOSITS, CONSISTENT WITH IRON PILL GASTROPATHY - WARTHIN-STARRY STAIN IS NEGATIVE FOR HELICOBACTER PYLORI   *   Chronic Xarelto for Afib/flutter, on  hold  *   CHF.  Non-ischemic CM, prominent RV failure in setting poor compliance with therapies for O2 dependent COPD/OSA/OHS.  EF 40%.  On milrinone, Lasxi gtt.    *   IDDM.  Poorly controlled.    *   Chronic pain syndrome.     PLAN   *  Continue BID PO Protonix.    *   relook EGD tmrw.      Azucena Freed  03/14/2019, 1:56 PM Phone [phone]

## 2019-03-14 NOTE — Anesthesia Preprocedure Evaluation (Addendum)
Anesthesia Evaluation  Patient identified by MRN, date of birth, ID band Patient awake    Reviewed: Allergy & Precautions, NPO status , Patient's Chart, lab work & pertinent test results, reviewed documented beta blocker date and time   History of Anesthesia Complications Negative for: history of anesthetic complications  Airway Mallampati: I  TM Distance: >3 FB Neck ROM: Full    Dental  (+) Edentulous Upper, Edentulous Lower   Pulmonary sleep apnea (does not use his CPAP), Continuous Positive Airway Pressure Ventilation and Oxygen sleep apnea , COPD,  COPD inhaler and oxygen dependent, former smoker,  COVID-19 NEG      rales    Cardiovascular hypertension, Pt. on medications and Pt. on home beta blockers (-) angina+ CAD (non-obstructive by cath) and +CHF (on milrinone)  + dysrhythmias Atrial Fibrillation  Rhythm:Regular Rate:Normal  01/2019 ECHO: EF 45-50%, severely reduced RV function, mild TR   Neuro/Psych negative neurological ROS     GI/Hepatic Neg liver ROS, GERD  Medicated and Controlled,  Endo/Other  diabetes (glu 188), Insulin Dependent, Oral Hypoglycemic AgentsMorbid obesity  Renal/GU Renal InsufficiencyRenal disease (creat 2.09)     Musculoskeletal  (+) Arthritis ,   Abdominal (+) + obese,   Peds  Hematology  (+) Blood dyscrasia (Hb 7.9), anemia , xarelto   Anesthesia Other Findings   Reproductive/Obstetrics                            Anesthesia Physical Anesthesia Plan  ASA: IV  Anesthesia Plan: MAC   Post-op Pain Management:    Induction:   PONV Risk Score and Plan: 1 and Treatment may vary due to age or medical condition  Airway Management Planned: Natural Airway and Nasal Cannula  Additional Equipment:   Intra-op Plan:   Post-operative Plan:   Informed Consent: I have reviewed the patients History and Physical, chart, labs and discussed the procedure including  the risks, benefits and alternatives for the proposed anesthesia with the patient or authorized representative who has indicated his/her understanding and acceptance.       Plan Discussed with: CRNA and Surgeon  Anesthesia Plan Comments:        Anesthesia Quick Evaluation

## 2019-03-14 NOTE — Progress Notes (Signed)
Orthopedic Tech Progress Note Patient Details:  Nicholas Caldwell 1946/05/11 [id number]  Ortho Devices Ortho Device/Splint Location: Bilateral unna boots Ortho Device/Splint Interventions: Application   Post Interventions Patient Tolerated: Well Instructions Provided: Care of device   [name] 03/14/2019, 11:48 AM

## 2019-03-15 ENCOUNTER — Encounter (HOSPITAL_COMMUNITY): Payer: Self-pay | Admitting: *Deleted

## 2019-03-15 ENCOUNTER — Inpatient Hospital Stay (HOSPITAL_COMMUNITY): Payer: Medicare Other | Admitting: Certified Registered"

## 2019-03-15 ENCOUNTER — Encounter (HOSPITAL_COMMUNITY)
Admission: EM | Disposition: A | Discharge status: Home Health | Payer: Self-pay | Source: Home / Self Care | Attending: Internal Medicine

## 2019-03-15 DIAGNOSIS — K922 Gastrointestinal hemorrhage, unspecified: Secondary | ICD-10-CM

## 2019-03-15 DIAGNOSIS — K3189 Other diseases of stomach and duodenum: Secondary | ICD-10-CM

## 2019-03-15 HISTORY — PX: BIOPSY: SHX5522

## 2019-03-15 HISTORY — PX: SUBMUCOSAL TATTOO INJECTION: SHX6856

## 2019-03-15 HISTORY — PX: ENTEROSCOPY: SHX5533

## 2019-03-15 LAB — BASIC METABOLIC PANEL
Anion gap: 13 (ref 5–15)
BUN: 75 mg/dL — ABNORMAL HIGH (ref 8–23)
CO2: 37 mmol/L — ABNORMAL HIGH (ref 22–32)
Calcium: 8.5 mg/dL — ABNORMAL LOW (ref 8.9–10.3)
Chloride: 82 mmol/L — ABNORMAL LOW (ref 98–111)
Creatinine, Ser: 2.25 mg/dL — ABNORMAL HIGH (ref 0.61–1.24)
GFR calc Af Amer: 33 mL/min — ABNORMAL LOW (ref >60)
GFR calc non Af Amer: 28 mL/min — ABNORMAL LOW (ref >60)
Glucose, Bld: 258 mg/dL — ABNORMAL HIGH (ref 70–99)
Potassium: 3.3 mmol/L — ABNORMAL LOW (ref 3.5–5.1)
Sodium: 132 mmol/L — ABNORMAL LOW (ref 135–145)

## 2019-03-15 LAB — GLUCOSE, CAPILLARY
Glucose-Capillary: 188 mg/dL — ABNORMAL HIGH (ref 70–99)
Glucose-Capillary: 507 mg/dL (ref 70–99)
Glucose-Capillary: 277 mg/dL — ABNORMAL HIGH (ref 70–99)

## 2019-03-15 LAB — COOXEMETRY PANEL
Carboxyhemoglobin: 2 % — ABNORMAL HIGH (ref 0.5–1.5)
Carboxyhemoglobin: 2.1 % — ABNORMAL HIGH (ref 0.5–1.5)
Methemoglobin: 1.9 % — ABNORMAL HIGH (ref 0.0–1.5)
Methemoglobin: 1.6 % — ABNORMAL HIGH (ref 0.0–1.5)
O2 Saturation: 98.3 %
O2 Saturation: 70.7 %
Total hemoglobin: 8.1 g/dL — ABNORMAL LOW (ref 12.0–16.0)
Total hemoglobin: 9.3 g/dL — ABNORMAL LOW (ref 12.0–16.0)

## 2019-03-15 LAB — CBC
HCT: 26.3 % — ABNORMAL LOW (ref 39.0–52.0)
Hemoglobin: 7.9 g/dL — ABNORMAL LOW (ref 13.0–17.0)
MCH: 25.6 pg — ABNORMAL LOW (ref 26.0–34.0)
MCHC: 30 g/dL (ref 30.0–36.0)
MCV: 85.1 fL (ref 80.0–100.0)
Platelets: 308 10*3/uL (ref 150–400)
RBC: 3.09 MIL/uL — ABNORMAL LOW (ref 4.22–5.81)
RDW: 24 % — ABNORMAL HIGH (ref 11.5–15.5)
WBC: 11.6 10*3/uL — ABNORMAL HIGH (ref 4.0–10.5)
nRBC: 0.3 % — ABNORMAL HIGH (ref 0.0–0.2)

## 2019-03-15 SURGERY — ENTEROSCOPY
Anesthesia: Monitor Anesthesia Care

## 2019-03-15 MED ORDER — PREDNISONE 10 MG PO TABS
10.0000 mg | ORAL_TABLET | Freq: Every day | ORAL | Status: AC
  Administered 2019-03-19 – 2019-03-20 (×2): 10 mg via ORAL
  Filled 2019-03-15 (×2): qty 1

## 2019-03-15 MED ORDER — INSULIN ASPART 100 UNIT/ML ~~LOC~~ SOLN
0.0000 [IU] | Freq: Three times a day (TID) | SUBCUTANEOUS | Status: DC
  Administered 2019-03-16: 11 [IU] via SUBCUTANEOUS
  Administered 2019-03-16: 7 [IU] via SUBCUTANEOUS
  Administered 2019-03-16: 15 [IU] via SUBCUTANEOUS
  Administered 2019-03-17: 7 [IU] via SUBCUTANEOUS
  Administered 2019-03-17: 11 [IU] via SUBCUTANEOUS
  Administered 2019-03-17: 7 [IU] via SUBCUTANEOUS
  Administered 2019-03-18: 11 [IU] via SUBCUTANEOUS
  Administered 2019-03-18: 20 [IU] via SUBCUTANEOUS
  Administered 2019-03-19: 3 [IU] via SUBCUTANEOUS
  Administered 2019-03-19: 4 [IU] via SUBCUTANEOUS
  Administered 2019-03-20: 15 [IU] via SUBCUTANEOUS
  Administered 2019-03-21 – 2019-03-22 (×3): 7 [IU] via SUBCUTANEOUS

## 2019-03-15 MED ORDER — SPOT INK MARKER SYRINGE KIT
PACK | SUBMUCOSAL | Status: DC | PRN
  Administered 2019-03-15: 1.5 mL via SUBMUCOSAL

## 2019-03-15 MED ORDER — SODIUM CHLORIDE 0.9 % IV SOLN
510.0000 mg | Freq: Once | INTRAVENOUS | Status: AC
  Administered 2019-03-15: 510 mg via INTRAVENOUS
  Filled 2019-03-15: qty 17

## 2019-03-15 MED ORDER — PREDNISONE 20 MG PO TABS
20.0000 mg | ORAL_TABLET | Freq: Every day | ORAL | Status: AC
  Administered 2019-03-17 – 2019-03-18 (×2): 20 mg via ORAL
  Filled 2019-03-15 (×2): qty 1

## 2019-03-15 MED ORDER — SPOT INK MARKER SYRINGE KIT
PACK | SUBMUCOSAL | Status: AC
  Filled 2019-03-15: qty 5

## 2019-03-15 MED ORDER — GUAIFENESIN 200 MG PO TABS
400.0000 mg | ORAL_TABLET | Freq: Two times a day (BID) | ORAL | Status: DC
  Administered 2019-03-15 – 2019-03-31 (×31): 400 mg via ORAL
  Filled 2019-03-15 (×34): qty 2

## 2019-03-15 MED ORDER — ETOMIDATE 2 MG/ML IV SOLN
INTRAVENOUS | Status: DC | PRN
  Administered 2019-03-15: 6 mg via INTRAVENOUS

## 2019-03-15 MED ORDER — PROPOFOL 500 MG/50ML IV EMUL
INTRAVENOUS | Status: DC | PRN
  Administered 2019-03-15: 50 ug/kg/min via INTRAVENOUS

## 2019-03-15 MED ORDER — LIDOCAINE 2% (20 MG/ML) 5 ML SYRINGE
INTRAMUSCULAR | Status: DC | PRN
  Administered 2019-03-15: 100 mg via INTRAVENOUS

## 2019-03-15 MED ORDER — HEPARIN (PORCINE) 25000 UT/250ML-% IV SOLN
1100.0000 [IU]/h | INTRAVENOUS | Status: DC
  Administered 2019-03-16: 1000 [IU]/h via INTRAVENOUS
  Filled 2019-03-15: qty 250

## 2019-03-15 MED ORDER — PROPOFOL 10 MG/ML IV BOLUS
INTRAVENOUS | Status: DC | PRN
  Administered 2019-03-15: 30 mg via INTRAVENOUS

## 2019-03-15 MED ORDER — BUTAMBEN-TETRACAINE-BENZOCAINE 2-2-14 % EX AERO
INHALATION_SPRAY | CUTANEOUS | Status: DC | PRN
  Administered 2019-03-15: 2 via TOPICAL

## 2019-03-15 MED ORDER — SODIUM CHLORIDE 0.9 % IV SOLN
INTRAVENOUS | Status: DC
  Administered 2019-03-15: 08:00:00 via INTRAVENOUS

## 2019-03-15 MED ORDER — INSULIN ASPART 100 UNIT/ML ~~LOC~~ SOLN
7.0000 [IU] | Freq: Once | SUBCUTANEOUS | Status: AC
  Administered 2019-03-15: 17:00:00 7 [IU] via SUBCUTANEOUS

## 2019-03-15 MED ORDER — POTASSIUM CHLORIDE CRYS ER 20 MEQ PO TBCR
40.0000 meq | EXTENDED_RELEASE_TABLET | Freq: Once | ORAL | Status: AC
  Administered 2019-03-15: 07:00:00 40 meq via ORAL
  Filled 2019-03-15: qty 2

## 2019-03-15 MED ORDER — ACETYLCYSTEINE 20 % IN SOLN
2.0000 mL | Freq: Two times a day (BID) | RESPIRATORY_TRACT | Status: DC
  Administered 2019-03-15 – 2019-03-17 (×5): 2 mL via RESPIRATORY_TRACT
  Administered 2019-03-18: 21:00:00 via RESPIRATORY_TRACT
  Administered 2019-03-18 – 2019-03-20 (×5): 2 mL via RESPIRATORY_TRACT
  Filled 2019-03-15 (×2): qty 4
  Filled 2019-03-15: qty 2
  Filled 2019-03-15 (×3): qty 4
  Filled 2019-03-15: qty 2
  Filled 2019-03-15 (×6): qty 4

## 2019-03-15 SURGICAL SUPPLY — 15 items

## 2019-03-15 NOTE — Interval H&P Note (Signed)
History and Physical Interval Note:  03/15/2019 7:51 AM  Nicholas Caldwell  has presented today for surgery, with the diagnosis of EVALUATE STOMACH.  The various methods of treatment have been discussed with the patient and family. After consideration of risks, benefits and other options for treatment, the patient has consented to  Procedure(s): ESOPHAGOGASTRODUODENOSCOPY (EGD) WITH PROPOFOL (N/A) as a surgical intervention.  The patient's history has been reviewed, patient examined, no change in status, stable for surgery.  I have reviewed the patient's chart and labs.  Questions were answered to the patient's satisfaction.     [employer]

## 2019-03-15 NOTE — Transfer of Care (Signed)
Immediate Anesthesia Transfer of Care Note  Patient: Nicholas Caldwell  Procedure(s) Performed: ESOPHAGOGASTRODUODENOSCOPY (EGD) WITH PROPOFOL (N/A ) ENTEROSCOPY (N/A ) SUBMUCOSAL TATTOO INJECTION BIOPSY  Patient Location: PACU  Anesthesia Type:MAC  Level of Consciousness: awake, alert , oriented and patient cooperative  Airway & Oxygen Therapy: Patient Spontanous Breathing and Patient connected to nasal cannula oxygen  Post-op Assessment: Report given to RN and Post -op Vital signs reviewed and stable  Post vital signs: Reviewed and stable  Last Vitals:  Vitals Value Taken Time  BP 108/51 03/15/2019  8:55 AM  Temp    Pulse 101 03/15/2019  9:00 AM  Resp 19 03/15/2019  9:00 AM  SpO2 98 % 03/15/2019  9:00 AM  Vitals shown include unvalidated device data.  Last Pain:  Vitals:   03/15/19 0855  TempSrc: Oral  PainSc: 4       Patients Stated Pain Goal: 2 (03/70/96 4383)  Complications: No apparent anesthesia complications

## 2019-03-15 NOTE — Addendum Note (Signed)
Addendum  created 03/15/19 1110 by Cleda Daub, CRNA   Intraprocedure Meds edited

## 2019-03-15 NOTE — Op Note (Addendum)
[facility] Patient Name: Nicholas Caldwell Procedure Date : 03/15/2019 MRN: [id number] Attending MD: Justice Britain , MD Date of Birth: [birth year] CSN: [id number] Age: 73 Admit Type: Inpatient Procedure:                Small bowel enteroscopy Indications:              Iron deficiency anemia, Surveillance procedure,                            Follow up recent EGD with findings initially                            concerning for possible underlying malignant                            process (to get further biopsies and confirm no                            malignacy) prior to anticoagulation plans Providers:                Justice Britain, MD, Raynelle Bring, RN, Elspeth Cho Tech., Technician, Ladona Ridgel,                            Technician Referring MD:             Dr. Loleta Books ([facility]), Gerrit Heck, MD Medicines:                Monitored Anesthesia Care Complications:            No immediate complications. Estimated Blood Loss:     Estimated blood loss was minimal. Procedure:                After obtaining informed consent, the endoscope was                            passed under direct vision. Throughout the                            procedure, the patient's blood pressure, pulse, and                            oxygen saturations were monitored                            continuously.After obtaining informed consent, the                            endoscope was passed under direct vision.                            Throughout the procedure, the patient's blood                            pressure, pulse, and  oxygen saturations were                            monitored continuously. The PCF-[id number] ([id number])                            Olympus pediatric colonoscope was introduced                            through the mouth and advanced to the proximal                            jejunum. The small bowel enteroscopy was                  accomplished without difficulty. The patient                            tolerated the procedure. Scope In: Scope Out: Findings:      No gross lesions were noted in the entire esophagus.      Diffuse moderate mucosal changes characterized by congestion, erythema,       friability (with contact bleeding), granularity and inflammation were       found in the cardia and in the gastric body. Biopsies were taken with a       cold forceps for histology to rule out an infiltrative process and/or       malignant process (though imaging compared to previously described EGD       seems to be improved).      Multiple dispersed, small non-bleeding erosions were found at the       incisura, in the gastric antrum and in the prepyloric region of the       stomach. Biopsies were taken with a cold forceps for histology and HP       evaluation.      One non-bleeding superficial gastric ulcer with a clean ulcer base       (Forrest Class III) was found in the prepyloric region of the stomach.       The lesion was 5 mm in largest dimension.      Normal mucosa was found in the entire duodenum.      Normal mucosa was found in the proximal jejunum. Area was tattooed with       an injection of Spot (carbon black) to demarcate the distal extent of       the SBE today. Impression:               - No gross lesions in esophagus.                           - Congested, erythematous, friable (with contact                            bleeding), granular and inflamed mucosa in the                            cardia and gastric body. Biopsied.                           -  Non-bleeding erosive gastropathy. Biopsied for HP.                           - Non-bleeding gastric ulcer with a clean ulcer                            base (Forrest Class III).                           - Normal mucosa was found in the entire examined                            duodenum.                           - Normal mucosa was found  in the proximal jejunum.                            Tattooed distal extent. Recommendation:           - The patient will be observed post-procedure,                            until all discharge criteria are met.                           - Return patient to hospital ward for ongoing care.                           - Would consider IV Iron infusion. If already given                            then next should be 5-7 days apart. Patient will                            likely benefit from a total of 3 IV Iron infusions                            over the next month.                           - Await pathology results.                           - Timing/consideration of anticoagulation needs                            based on the [facility] pathology if possible. Looking at                            prior endoscopic images, it seems that things are                            more likely to be inflammatory driven and today's  images do not have typical appearance of a                            malignant process - likely improving with acid                            reducing medications over last few days. Would                            consider IV Heparin gtt in 24 hours (once pathology                            returns). However, if must start sooner then would                            not start sooner then 10-hours from now without a                            bolus to minimize oozing from our biopsy sites as                            well.                           - Follow up EGD to be considered as outpatient by                            primary GI Cirigliano once he is ready for                            discharge - query 8-10 weeks.                           - Maintain PO PPI BID 40 mg x 12-weeks at minimum.                           - Minimize NSAID use as able.                           - The findings and recommendations were discussed                             with the patient.                           - The findings and recommendations were discussed                            with the referring physician. Procedure Code(s):        --- Professional ---                           [phone], Small intestinal endoscopy, enteroscopy  beyond second portion of duodenum, not including                            ileum; with biopsy, single or multiple                           44799, Unlisted procedure, small intestine Diagnosis Code(s):        --- Professional ---                           K92.2, Gastrointestinal hemorrhage, unspecified                           K31.89, Other diseases of stomach and duodenum                           K29.70, Gastritis, unspecified, without bleeding                           K25.9, Gastric ulcer, unspecified as acute or                            chronic, without hemorrhage or perforation                           D50.9, Iron deficiency anemia, unspecified CPT copyright 2019 American Medical Association. All rights reserved. The codes documented in this report are preliminary and upon coder review may  be revised to meet current compliance requirements. Justice Britain, MD 03/15/2019 9:06:45 AM Number of Addenda: 0

## 2019-03-15 NOTE — Progress Notes (Signed)
ANTICOAGULATION CONSULT NOTE - Initial Consult  Pharmacy Consult for heparin to DOAC Indication: atrial fibrillation  No Known Allergies  Patient Measurements: Height: 6' (182.9 cm) Weight: 273 lb 13 oz (124.2 kg) IBW/kg (Calculated) : 77.6 Heparin Dosing Weight: 105 kg  Vital Signs: Temp: 98.1 F (36.7 C) (05/12 1530) Temp Source: Oral (05/12 1530) BP: 114/63 (05/12 1530) Pulse Rate: 117 (05/12 1530)  Labs: Recent Labs    03/13/19 0416 03/14/19 0506 03/14/19 1619 03/15/19 0416  HGB 7.9*  --   --  7.9*  HCT 26.5*  --   --  26.3*  PLT 379  --   --  308  CREATININE 1.92* 1.92* 2.09* 2.25*    Estimated Creatinine Clearance: 41.6 mL/min (A) (by C-G formula based on SCr of 2.25 mg/dL (H)).   Medical History: Past Medical History:  Diagnosis Date  . Atrial flutter ([place])    a. recurrent AFlutter with RVR;  b. Amiodarone Rx started 4/16  . CAD (coronary artery disease)    a. LHC 1/16:  mLAD diffuse disease, pLCx mild disease, dLCx with disease but too small for PCI, RCA ok, EF 25-30%  . Chronic pain   . Chronic systolic CHF (congestive heart failure) ([place])   . COPD (chronic obstructive pulmonary disease) ([place])   . Diabetes mellitus without complication ([place])   . Hypercholesteremia   . Hypertension   . NICM (nonischemic cardiomyopathy) ([place])    a.dx 2016. b. 2D echo 06/2016 - Last echo 07/01/16: mod dilated LV, mod LVH, EF 25-30%, mild-mod MR, sev LAE, mild-mod reduced RV systolic function, mild-mod TR, PASP 20mHG.  .Marland KitchenPAF (paroxysmal atrial fibrillation) ([facility])    On amio - ot a candidate for flecainide due to cardiomyopathy, not a candidate for Tikosyn due to prolonged QT, and felt to be a poor candidate for ablation given left atrial size.  . [place]monary hypertension [place]   . Tobacco abuse     Medications:  Medications Prior to Admission  Medication Sig Dispense Refill Last Dose  . albuterol (VENTOLIN HFA) 108 (90 Base) MCG/ACT inhaler Inhale 2 puffs into the lungs  every 6 (six) hours as needed for wheezing or shortness of breath.   Past Week at Unknown time  . bisoprolol (ZEBETA) 5 MG tablet Take 0.5 tablets (2.5 mg total) by mouth daily for 30 days. 15 tablet 3 03/06/2019 at 0800  . budesonide-formoterol (SYMBICORT) 160-4.5 MCG/ACT inhaler Inhale 2 puffs into the lungs 2 (two) times daily.   Past Week at Unknown time  . diazepam (VALIUM) 5 MG tablet Take 5 mg by mouth daily as needed for anxiety.   03/06/2019 at am  . fluticasone (FLONASE) 50 MCG/ACT nasal spray Place 2 sprays into both nostrils daily as needed for allergies.    Past Week at Unknown time  . gabapentin (NEURONTIN) 600 MG tablet Take 1 tablet (600 mg total) by mouth 2 (two) times daily. 60 tablet 0 03/06/2019 at am  . guaifenesin (HUMIBID E) 400 MG TABS tablet Take 1 tablet (400 mg total) by mouth every 6 (six) hours as needed. (Patient taking differently: Take 400 mg by mouth every 6 (six) hours as needed (cough). ) 10 tablet 0 Past Week at Unknown time  . hydrocortisone (ANUSOL-HC) 2.5 % rectal cream Place 1 application rectally 2 (two) times daily as needed for hemorrhoids or itching.    Past Week at Unknown time  . hydrocortisone 1 % lotion Apply 1 application topically See admin instructions. Apply small amount to back  twice daily for itchy rash   Past Week at Unknown time  . insulin aspart protamine - aspart (NOVOLOG MIX 70/30 FLEXPEN) (70-30) 100 UNIT/ML FlexPen Inject 0.05 mLs (5 Units total) into the skin 2 (two) times daily. (Patient taking differently: Inject 30 Units into the skin 2 (two) times daily. ) 15 mL 11 03/06/2019 at am  . ipratropium-albuterol (DUONEB) 0.5-2.5 (3) MG/3ML SOLN Take 3 mLs by nebulization 2 (two) times daily as needed (shortness of breath/wheezing).    03/05/2019 at Unknown time  . isosorbide-hydrALAZINE (BIDIL) 20-37.5 MG tablet Take 1 tablet by mouth 3 (three) times daily. (Patient taking differently: Take 1 tablet by mouth 2 (two) times a day. ) 90 tablet 3 03/06/2019 at  am  . latanoprost (XALATAN) 0.005 % ophthalmic solution Place 1 drop into both eyes at bedtime.   Past Week at Unknown time  . magnesium oxide (MAG-OX) 400 MG tablet Take 400 mg by mouth daily.   03/06/2019 at am  . metFORMIN (GLUCOPHAGE-XR) 500 MG 24 hr tablet Take 1,000 mg by mouth 2 (two) times daily with a meal.   03/06/2019 at am  . metolazone (ZAROXOLYN) 2.5 MG tablet Take 1 tablet (2.5 mg total) by mouth every Wednesday. Take extra Potassium 55mq (40 total, daily) when taking this medication 10 tablet 3 2 weeks ago  . oxyCODONE-acetaminophen (PERCOCET) 10-325 MG tablet Take 1 tablet by mouth every 6 (six) hours.   03/06/2019 at 1700  . OXYGEN Inhale 3 L into the lungs continuous.    03/06/2019 at Unknown time  . pantoprazole (PROTONIX) 40 MG tablet Take 1 tablet (40 mg total) by mouth 2 (two) times daily. 60 tablet 0 Past Week at Unknown time  . polyethylene glycol (MIRALAX / GLYCOLAX) packet Take 17 g by mouth daily. (Patient taking differently: Take 17 g by mouth daily as needed (constipation). ) 14 each 0 Past Month at Unknown time  . potassium chloride SA (K-DUR) 20 MEQ tablet Take 40 mEq by mouth See admin instructions. Take 2 tablets (40 meq) by mouth every morning, take an additional tablet (20 meq) at night if taking metolazone that day.   03/06/2019 at am  . rivaroxaban (XARELTO) 20 MG TABS tablet Take 1 tablet (20 mg total) by mouth daily with supper. (Patient taking differently: Take 20 mg by mouth at bedtime. ) 30 tablet 4 03/05/2019 at 2300  . rosuvastatin (CRESTOR) 40 MG tablet Take 20 mg by mouth daily.   03/06/2019 at am  . sodium chloride (OCEAN) 0.65 % SOLN nasal spray Place 2 sprays into both nostrils as needed for congestion.    Past Month at Unknown time  . torsemide (DEMADEX) 20 MG tablet Take 4 tablets (80 mg total) by mouth 2 (two) times daily for 30 days. 240 tablet 0 03/06/2019 at afternoon  . acetaminophen (TYLENOL) 160 MG/5ML solution Take 20.3 mLs (650 mg total) by mouth every 6  (six) hours as needed for mild pain, headache or fever. (Patient not taking: Reported on 03/06/2019) 120 mL 0 Not Taking at Unknown time  . diclofenac sodium (VOLTAREN) 1 % GEL Apply 2 g topically 4 (four) times daily. (Patient not taking: Reported on 03/07/2019) 1 Tube 1 Not Taking at Unknown time  . Magnesium Oxide 420 (252 Mg) MG TABS Take 420 mg by mouth 2 (two) times daily. (Patient not taking: Reported on 03/06/2019) 60 tablet 0 Not Taking at Unknown time  . Multiple Vitamin (MULTIVITAMIN WITH MINERALS) TABS tablet Take 1 tablet by mouth  daily. (Patient not taking: Reported on 03/06/2019)   Not Taking at Unknown time  . potassium chloride SA (K-DUR,KLOR-CON) 20 MEQ tablet Take 2 tablets (40 mEq total) by mouth every morning AND 1 tablet (20 mEq total) at bedtime. (Patient not taking: Reported on 03/06/2019) 90 tablet 0 Not Taking at Unknown time  . rosuvastatin (CRESTOR) 20 MG tablet Take 1 tablet (20 mg total) by mouth daily. (Patient not taking: Reported on 03/06/2019) 30 tablet 0 Not Taking at Unknown time    Assessment: 73 y/o male admitted 03/06/2019 with SOB from acute on chronic systolic CHF; he was also noted to have anemia with Hgb 6.7 (baseline ~8). He was on Xarelto PTA for Afib which has been on hold - noncompliance noted. He had EGD today and biopsy taken. Pharmacy consulted to begin IV heparin drip tomorrow morning and continue for 24 hr then switch to DOAC. No bleeding noted currently, Hgb stable 7-8s, platelets are normal.   Goal of Therapy:  Heparin level 0.3-0.5 units/ml d/t anemia  Monitor platelets by anticoagulation protocol: Yes   Plan:  Begin IV heparin drip at 1000 units/hr with no bolus tomorrow morning and for 24 hr 6 hr heparin level  CBC in am Monitor for s/sx of bleeding HF team to decide on Xarelto vs Eliquis after 24 hr of IV heparin per discussion with Dr. Nigel Mormon, PharmD, BCPS Clinical Pharmacist Clinical phone for 03/15/2019 until 8p is  [phone] 03/15/2019 4:56 PM  **Pharmacist phone directory can now be found on [email or web address] listed under [place]**

## 2019-03-15 NOTE — Plan of Care (Signed)
  Problem: Education: Goal: Knowledge of General Education information will improve Description Including pain rating scale, medication(s)/side effects and non-pharmacologic comfort measures Outcome: Progressing   Problem: Health Behavior/Discharge Planning: Goal: Ability to manage health-related needs will improve Outcome: Progressing   Problem: Clinical Measurements: Goal: Ability to maintain clinical measurements within normal limits will improve Outcome: Progressing Goal: Will remain free from infection Outcome: Progressing Goal: Diagnostic test results will improve Outcome: Progressing Goal: Respiratory complications will improve Outcome: Progressing Goal: Cardiovascular complication will be avoided Outcome: Progressing   Problem: Activity: Goal: Risk for activity intolerance will decrease Outcome: Progressing   Problem: Nutrition: Goal: Adequate nutrition will be maintained Outcome: Progressing   Problem: Coping: Goal: Level of anxiety will decrease Outcome: Progressing   Problem: Elimination: Goal: Will not experience complications related to bowel motility Outcome: Progressing Goal: Will not experience complications related to urinary retention Outcome: Progressing   Problem: Pain Managment: Goal: General experience of comfort will improve Outcome: Progressing   Problem: Safety: Goal: Ability to remain free from injury will improve Outcome: Progressing   Problem: Skin Integrity: Goal: Risk for impaired skin integrity will decrease Outcome: Progressing   Problem: Education: Goal: Ability to demonstrate management of disease process will improve Outcome: Progressing Goal: Ability to verbalize understanding of medication therapies will improve Outcome: Progressing Goal: Individualized Educational Video(s) Outcome: Progressing   Problem: Activity: Goal: Capacity to carry out activities will improve Outcome: Progressing   Problem: Cardiac: Goal:  Ability to achieve and maintain adequate cardiopulmonary perfusion will improve Outcome: Progressing

## 2019-03-15 NOTE — Progress Notes (Signed)
Physical Therapy Treatment Patient Details Name: Nicholas Caldwell MRN: [id number] DOB: 06/11/1946 Today's Date: 03/15/2019    History of Present Illness Pt is a 73 y.o. male admitted 03/06/19 with acute on chronic heart failure and respiratory failure. F ound to have severe iron deficiency anemia; now s/p small bowel enteroscopy and biopsy 5/12. PMH includes CHF, chronic anemia, HLD, obesity, CAD, NICM, polyneuropathy, DM, COPD.   PT Comments    Pt slowly progressing with mobility. Able to stand and amb short distance in room with RW and supervision; increased time and effort for mobility due to fatigue. Pt requires assist for pericare, reporting wife assists with this at home. Will continue to follow acutely.    Follow Up Recommendations  Home health PT;Supervision/Assistance - 24 hour     Equipment Recommendations  None recommended by PT    Recommendations for Other Services       Precautions / Restrictions Precautions Precautions: Fall Restrictions Weight Bearing Restrictions: No    Mobility  Bed Mobility Overal bed mobility: Modified Independent Bed Mobility: Supine to Sit     Supine to sit: Modified independent (Device/Increase time);HOB elevated     General bed mobility comments: Insistent HOB and middle of bed be elevated despite educ; mod indep with use of bed rails. Required seated rest once at EOB due to fatigue/SOB  Transfers Overall transfer level: Needs assistance Equipment used: Rolling walker (2 wheeled) Transfers: Sit to/from Stand Sit to Stand: Supervision;+2 safety/equipment         General transfer comment: Supervision standing from bed and BSC to RW, heavy reliance on UE support  Ambulation/Gait Ambulation/Gait assistance: Supervision Gait Distance (Feet): 10 Feet Assistive device: Rolling walker (2 wheeled) Gait Pattern/deviations: Step-through pattern;Decreased stride length;Trunk flexed Gait velocity: Decreased Gait velocity interpretation:  <1.8 ft/sec, indicate of risk for recurrent falls General Gait Details: Amb to [facility] then recliner with RW, supervision for safety; pt declined further movement after having BM and wanting to eat lunch   Stairs             Wheelchair Mobility    Modified Rankin (Stroke Patients Only)       Balance Overall balance assessment: Needs assistance Sitting-balance support: No upper extremity supported Sitting balance-Leahy Scale: Fair     Standing balance support: Bilateral upper extremity supported;During functional activity Standing balance-Leahy Scale: Poor Standing balance comment: Dependent for pericare while stand with BUE support on RW; reports wife does this for him at home                            Cognition Arousal/Alertness: Awake/alert Behavior During Therapy: WFL for tasks assessed/performed Overall Cognitive Status: Within Functional Limits for tasks assessed                                        Exercises      General Comments        Pertinent Vitals/Pain Pain Assessment: Faces Faces Pain Scale: Hurts a little bit Pain Location: Generalized Pain Descriptors / Indicators: Discomfort Pain Intervention(s): Limited activity within patient's tolerance    Home Living                      Prior Function            PT Goals (current goals can now be found in the care  plan section) Acute Rehab PT Goals Patient Stated Goal: to go home PT Goal Formulation: With patient Time For Goal Achievement: 03/24/19 Potential to Achieve Goals: Good Progress towards PT goals: Progressing toward goals    Frequency    Min 3X/week      PT Plan Current plan remains appropriate    Co-evaluation              AM-PAC PT "6 Clicks" Mobility   Outcome Measure  Help needed turning from your back to your side while in a flat bed without using bedrails?: None Help needed moving from lying on your back to sitting on the side  of a flat bed without using bedrails?: A Little Help needed moving to and from a bed to a chair (including a wheelchair)?: A Little Help needed standing up from a chair using your arms (e.g., wheelchair or bedside chair)?: A Little Help needed to walk in hospital room?: A Little Help needed climbing 3-5 steps with a railing? : A Little 6 Click Score: 19    End of Session Equipment Utilized During Treatment: Gait belt;Oxygen Activity Tolerance: Patient limited by fatigue Patient left: in chair;with call bell/phone within reach Nurse Communication: Mobility status PT Visit Diagnosis: Unsteadiness on feet (R26.81);Muscle weakness (generalized) (M62.81)     Time: 0488-8916 PT Time Calculation (min) (ACUTE ONLY): 21 min  Charges:  $Therapeutic Activity: 8-22 mins                    Mabeline Caras, PT, DPT Acute Rehabilitation Services  Pager [phone] Office [phone]  Derry Lory 03/15/2019, 12:55 PM

## 2019-03-15 NOTE — Progress Notes (Signed)
PROGRESS NOTE    Nicholas Caldwell  KVQ:[id number] DOB: Dec 25, 1945 DOA: 03/06/2019 PCP: [facility]      Brief Narrative:  Nicholas Caldwell is a 73 y.o. M with CAD, isch CM EF 40%, sdCHF, pAF on Xarelto, pHTN, MO, OSA not compliant with CPAP, COPD on home O2 3L, and smoking who presented with swelling and dyspnea.  Patient had had slow progressive dyspnea, SOB with exertion, also significant peripheral edema.  In ER, BNP >1000, CXR with edema, HR 124 in Afib, and new anemia Hgb 6.7 g/dL.  Transfused and started on Lasix.      Assessment & Plan:  Acute on chronic hypoxic respiratory failure This is multifactorial from acute CHF in COPD flare, as well as anemia, NSVT/AF.  Acute on chronic systolic and diastolic CHF EF 64-33% on TEE in Mar.  Still massively fluid overloaded, Cr rising to 2.2 mg/dL.  Net negative 3L yesterday. -Milrinone, metolazone and Lasix gtt per Cardiology, appreciate expert cares -Consult CHF team -Continue spironolactone -Continue BiDIL -Continue Crestor -Continue BB   Acute kidney injury Creatinine baseline 1.2 for the last 6 months, roughly, trending up from 0.9 last few years.  AKI again this hospitalization, has been increasing last few days with diuresis. -Decrease Lasix per Cardiology -Daily BMP  Iron deficiency anemia Suspected chronic GI blood loss from gastric ulcer Got Feraheme 5/4.  EGD 5/12 showed gastric ulcer, friable and bleeding on contact.  Biopsies sent although previous biopsy negative. -Repeat Feraheme today and in 1 week -Consult GI, appreciate expert assistance -Continue Protonix 40 BID 12 weeks -Hold anticoagulation 24 hours minimum -Cardiology have recommended resuming anticoagulation tomorrow, GI recommend using heparin initially due to high risk of re-bleeding -Heparin gtt for 24 hours starting 5/13 then if no bleeding can resume Xarelto or Eliquis, discussed with Pharmacy  Acute COPD exacerbation Patient with a lot of  complaints of chest congestion, mucus today.  Mucinex not helping.  -Continue prednisone taper, but will do faster to minimize steroid -Continue doxycycline day 4 of 7 -Continue Pulmicort and Brovana -Continue nebulized bronchodilators -Continue Mucinex -Chest PT -Trial mucormyst nebs  Positive blood culture CoNS in 1/2.  Likely contaminant.  Hypokalemia -Replete K today  Diabetes Glucoses sky high this afternoon (pred started yesterday and AM NPH held)   -Continue NPH -Increase sliding scale to high dose  Hypertension Coronary disease secondary prevention -Continue Crestor, BB  Paroxysmal atrial fibrillation NSVT CHA2DS2-Vasc 4 (Age, CHF, PVD, DM).   -Restart anticoagulation with heparin to monitor for bleeding, if none, can resume Eliquis or Xarelto by 5/14 per Cardiology -Continue BB  OHS/OSA/MO   Noncompliant with CPAP at home per report        MDM and disposition: The below labs and imaging reports were reviewed and summarized above.  Medication management as above.  The patient was admitted with hypoxic respiratory failure from CHF.   Also noted to have acute blood loss anemia from gastric ulcer.    Required transfusion again, Hgb dropped again, rescoped 5/12 and ulcer seems to be healing.   Continue diuretics per Cardiology, anticoagulation discussed with GI and RPH.    PT recommend HHPT.  Will need continued diuresis, management of worsening renal failure.  Likely several more days admission.        DVT prophylaxis: SCDs today, heparin tomorrow Code Status: FULL Family Communication: None    Consultants:   Cardiology  GI  Procedures:   EGD 5/12 Impression:               -  No gross lesions in esophagus.                           - Congested, erythematous, friable (with contact                            bleeding), granular and inflamed mucosa in the                            cardia and gastric body. Biopsied.                            - Non-bleeding erosive gastropathy. Biopsied for HP.                           - Non-bleeding gastric ulcer with a clean ulcer                            base (Forrest Class III).                           - Normal mucosa was found in the entire examined                            duodenum.                           - Normal mucosa was found in the proximal jejunum.                            Tattooed distal extent. Recommendation:           - The patient will be observed post-procedure,                            until all discharge criteria are met.                           - Return patient to hospital ward for ongoing care.                           - Would consider IV Iron infusion. If already given                            then next should be 5-7 days apart. Patient will                            likely benefit from a total of 3 IV Iron infusions                            over the next month.                           - Await pathology results.                           -  Timing/consideration of anticoagulation needs                            based on the [facility] pathology if possible. Looking at                            prior endoscopic images, it seems that things are                            more likely to be inflammatory driven and today's                            images do not have typical appearance of a                            malignant process - likely improving with acid                            reducing medications over last few days. Would                            consider IV Heparin gtt in 24 hours (once pathology                            returns). However, if must start sooner then would                            not start sooner then 10-hours from now without a                            bolus to minimize oozing from our biopsy sites as                            well.                           - Follow up EGD to be considered as outpatient by                             primary GI Cirigliano once he is ready for                            discharge - query 8-10 weeks.                           - Maintain PO PPI BID 40 mg x 12-weeks at minimum.                           - Minimize NSAID use as able.                           - The findings and recommendations were discussed  with the patient.                           - The findings and recommendations were discussed                            with the referring physician.  Antimicrobials:   Doxycycline    Subjective: Has phlegm in throat, can't clear it.  Still swollen.  No fever, no confusion.  No melena.    Objective: Vitals:   03/15/19 0400 03/15/19 0439 03/15/19 0712 03/15/19 0740  BP: 104/68  120/71 (!) 113/49  Pulse: 86  (!) 106 96  Resp:   (!) 21 20  Temp:   98.2 F (36.8 C) 98.3 F (36.8 C)  TempSrc:   Oral Oral  SpO2: 94%  95% 96%  Weight:  124.2 kg    Height:        Intake/Output Summary (Last 24 hours) at 03/15/2019 0830 Last data filed at 03/15/2019 4270 Gross per 24 hour  Intake 886.12 ml  Output 4225 ml  Net -3338.88 ml   Filed Weights   03/13/19 0923 03/14/19 0529 03/15/19 0439  Weight: 121.2 kg 124.5 kg 124.2 kg    Examination: General appearance: obese adult male, alert and in no acute distress.   HEENT: Anicteric, conjunctiva pink, lids and lashes normal. No nasal deformity, discharge, epistaxis.  Lips moist.   Skin: Warm and dry.   No suspicious rashes or lesions. Cardiac: Tachycardic, irregular, nl S1-S2, no murmurs appreciated.  Capillary refill is brisk.  JVP not visible.  LE edema noted, in unna boots.  Radial pulses 2+ and symmetric. Respiratory: Normal respiratory rate and rhythm. Shallow respirations, no wheezing appreciated. Abdomen: Abdomen soft.  No TTP. No ascites, distension, hepatosplenomegaly.   MSK: No deformities or effusions. Neuro: Awake and alert.  EOMI, moves all extremities. Speech fluent.     Psych: Sensorium intact and responding to questions, attention normal. Affect irritable.  Judgment and insight appear normal.    Data Reviewed: I have personally reviewed following labs and imaging studies:  CBC: Recent Labs  Lab 03/09/19 0457 03/10/19 0539 03/11/19 0630 03/12/19 0217 03/13/19 0416 03/15/19 0416  WBC 10.5 9.0 9.2 10.5 7.1 11.6*  NEUTROABS 8.0* 8.6* 8.5* 8.3* 6.1  --   HGB 8.0* 8.0* 8.3* 8.4* 7.9* 7.9*  HCT 28.0* 28.0* 27.8* 28.4* 26.5* 26.3*  MCV 84.3 86.4 84.5 83.3 84.7 85.1  PLT 548* 501* 489* 447* 379 623   Basic Metabolic Panel: Recent Labs  Lab 03/11/19 0630  03/12/19 1541 03/13/19 0416 03/14/19 0506 03/14/19 0734 03/14/19 1619 03/15/19 0416  NA 140   < > 134* 132* 136  --  130* 132*  K 2.6*   < > 3.5 3.2* 2.8*  --  3.6 3.3*  CL 82*   < > 83* 77* 84*  --  75* 82*  CO2 41*   < > 38* 39* 34*  --  37* 37*  GLUCOSE 293*   < > 283* 493* 282*  --  211* 258*  BUN 49*   < > 50* 57* 64*  --  69* 75*  CREATININE 1.73*   < > 1.64* 1.92* 1.92*  --  2.09* 2.25*  CALCIUM 8.7*   < > 8.3* 8.2* 7.9*  --  8.6* 8.5*  MG 2.2  --   --   --   --  1.9  --   --    < > =  values in this interval not displayed.   GFR: Estimated Creatinine Clearance: 41.6 mL/min (A) (by C-G formula based on SCr of 2.25 mg/dL (H)). Liver Function Tests: No results for input(s): AST, ALT, ALKPHOS, BILITOT, PROT, ALBUMIN in the last 168 hours. No results for input(s): LIPASE, AMYLASE in the last 168 hours. No results for input(s): AMMONIA in the last 168 hours. Coagulation Profile: No results for input(s): INR, PROTIME in the last 168 hours. Cardiac Enzymes: No results for input(s): CKTOTAL, CKMB, CKMBINDEX, TROPONINI in the last 168 hours. BNP (last 3 results) No results for input(s): PROBNP in the last 8760 hours. HbA1C: No results for input(s): HGBA1C in the last 72 hours. CBG: Recent Labs  Lab 03/14/19 1006 03/14/19 1106 03/14/19 1623 03/14/19 2119 03/15/19 0644  GLUCAP  338* 275* 198* 241* 188*   Lipid Profile: No results for input(s): CHOL, HDL, LDLCALC, TRIG, CHOLHDL, LDLDIRECT in the last 72 hours. Thyroid Function Tests: No results for input(s): TSH, T4TOTAL, FREET4, T3FREE, THYROIDAB in the last 72 hours. Anemia Panel: No results for input(s): VITAMINB12, FOLATE, FERRITIN, TIBC, IRON, RETICCTPCT in the last 72 hours. Urine analysis:    Component Value Date/Time   COLORURINE COLORLESS (A) 03/06/2019 1829   APPEARANCEUR CLEAR 03/06/2019 1829   LABSPEC 1.006 03/06/2019 1829   PHURINE 5.0 03/06/2019 1829   GLUCOSEU NEGATIVE 03/06/2019 1829   HGBUR NEGATIVE 03/06/2019 1829   BILIRUBINUR NEGATIVE 03/06/2019 1829   KETONESUR NEGATIVE 03/06/2019 1829   PROTEINUR NEGATIVE 03/06/2019 1829   UROBILINOGEN 0.2 10/26/2014 2206   NITRITE NEGATIVE 03/06/2019 1829   LEUKOCYTESUR NEGATIVE 03/06/2019 1829   Sepsis Labs: _0 (procalcitonin:4,lacticacidven:4)  ) Recent Results (from the past 240 hour(s))  SARS Coronavirus 2 (CEPHEID- Performed in [place] hospital lab), Hosp Order     Status: None   Collection Time: 03/06/19  6:29 PM  Result Value Ref Range Status   SARS Coronavirus 2 NEGATIVE NEGATIVE Final    Comment: (NOTE) If result is NEGATIVE SARS-CoV-2 target nucleic acids are NOT DETECTED. The SARS-CoV-2 RNA is generally detectable in upper and lower  respiratory specimens during the acute phase of infection. The lowest  concentration of SARS-CoV-2 viral copies this assay can detect is 250  copies / mL. A negative result does not preclude SARS-CoV-2 infection  and should not be used as the sole basis for treatment or other  patient management decisions.  A negative result may occur with  improper specimen collection / handling, submission of specimen other  than nasopharyngeal swab, presence of viral mutation(s) within the  areas targeted by this assay, and inadequate number of viral copies  (<250 copies / mL). A negative result must be  combined with clinical  observations, patient history, and epidemiological information. If result is POSITIVE SARS-CoV-2 target nucleic acids are DETECTED. The SARS-CoV-2 RNA is generally detectable in upper and lower  respiratory specimens dur ing the acute phase of infection.  Positive  results are indicative of active infection with SARS-CoV-2.  Clinical  correlation with patient history and other diagnostic information is  necessary to determine patient infection status.  Positive results do  not rule out bacterial infection or co-infection with other viruses. If result is PRESUMPTIVE POSTIVE SARS-CoV-2 nucleic acids MAY BE PRESENT.   A presumptive positive result was obtained on the submitted specimen  and confirmed on repeat testing.  While 2019 novel coronavirus  (SARS-CoV-2) nucleic acids may be present in the submitted sample  additional confirmatory testing may be necessary for epidemiological  and /  or clinical management purposes  to differentiate between  SARS-CoV-2 and other Sarbecovirus currently known to infect humans.  If clinically indicated additional testing with an alternate test  methodology [phone]) is advised. The SARS-CoV-2 RNA is generally  detectable in upper and lower respiratory sp ecimens during the acute  phase of infection. The expected result is Negative. Fact Sheet for Patients:  StrictlyIdeas.no Fact Sheet for Healthcare Providers: BankingDealers.co.za This test is not yet approved or cleared by the [place] FDA and has been authorized for detection and/or diagnosis of SARS-CoV-2 by FDA under an Emergency Use Authorization (EUA).  This EUA will remain in effect (meaning this test can be used) for the duration of the COVID-19 declaration under Section 564(b)(1) of the Act, 21 U.S.C. section 360bbb-3(b)(1), unless the authorization is terminated or revoked sooner. Performed at [facility], [place] [address] [place], [place] [address]   Urine culture     Status: Abnormal   Collection Time: 03/06/19  6:29 PM  Result Value Ref Range Status   Specimen Description URINE, RANDOM  Final   Special Requests NONE  Final   Culture (A)  Final    <10,000 COLONIES/mL INSIGNIFICANT GROWTH Performed at [facility], [place] [address], [place], [place] [address]    Report Status 03/07/2019 FINAL  Final  Blood culture (routine x 2)     Status: Abnormal   Collection Time: 03/06/19  6:54 PM  Result Value Ref Range Status   Specimen Description BLOOD RIGHT ANTECUBITAL  Final   Special Requests   Final    BOTTLES DRAWN AEROBIC AND ANAEROBIC Blood Culture adequate volume   Culture  Setup Time   Final    GRAM POSITIVE COCCI IN BOTH AEROBIC AND ANAEROBIC BOTTLES CRITICAL RESULT CALLED TO, READ BACK BY AND VERIFIED WITH: PHARMD T DANG [id number] MLM    Culture (A)  Final    STAPHYLOCOCCUS SPECIES (COAGULASE NEGATIVE) THE SIGNIFICANCE OF ISOLATING THIS ORGANISM FROM A SINGLE SET OF BLOOD CULTURES WHEN MULTIPLE SETS ARE DRAWN IS UNCERTAIN. PLEASE NOTIFY THE MICROBIOLOGY DEPARTMENT WITHIN ONE WEEK IF SPECIATION AND SENSITIVITIES ARE REQUIRED. Performed at [facility], [place] [address], [place], [place] [address]    Report Status 03/09/2019 FINAL  Final  Blood Culture ID Panel (Reflexed)     Status: Abnormal   Collection Time: 03/06/19  6:54 PM  Result Value Ref Range Status   Enterococcus species NOT DETECTED NOT DETECTED Final   Listeria monocytogenes NOT DETECTED NOT DETECTED Final   Staphylococcus species DETECTED (A) NOT DETECTED Final    Comment: Methicillin (oxacillin) resistant coagulase negative staphylococcus. Possible blood culture contaminant (unless isolated from more than one blood culture draw or clinical case suggests pathogenicity). No antibiotic treatment is indicated for blood  culture contaminants. CRITICAL RESULT CALLED TO, READ BACK BY AND VERIFIED WITH: PHARMD T  DANG [id number] MLM    Staphylococcus aureus (BCID) NOT DETECTED NOT DETECTED Final   Methicillin resistance DETECTED (A) NOT DETECTED Final    Comment: CRITICAL RESULT CALLED TO, READ BACK BY AND VERIFIED WITH: PHARMD T DANG [id number] MLM    Streptococcus species NOT DETECTED NOT DETECTED Final   Streptococcus agalactiae NOT DETECTED NOT DETECTED Final   Streptococcus pneumoniae NOT DETECTED NOT DETECTED Final   Streptococcus pyogenes NOT DETECTED NOT DETECTED Final   Acinetobacter baumannii NOT DETECTED NOT DETECTED Final   Enterobacteriaceae species NOT DETECTED NOT DETECTED Final   Enterobacter cloacae complex NOT DETECTED NOT  DETECTED Final   Escherichia coli NOT DETECTED NOT DETECTED Final   Klebsiella oxytoca NOT DETECTED NOT DETECTED Final   Klebsiella pneumoniae NOT DETECTED NOT DETECTED Final   Proteus species NOT DETECTED NOT DETECTED Final   Serratia marcescens NOT DETECTED NOT DETECTED Final   Haemophilus influenzae NOT DETECTED NOT DETECTED Final   Neisseria meningitidis NOT DETECTED NOT DETECTED Final   Pseudomonas aeruginosa NOT DETECTED NOT DETECTED Final   Candida albicans NOT DETECTED NOT DETECTED Final   Candida glabrata NOT DETECTED NOT DETECTED Final   Candida krusei NOT DETECTED NOT DETECTED Final   Candida parapsilosis NOT DETECTED NOT DETECTED Final   Candida tropicalis NOT DETECTED NOT DETECTED Final    Comment: Performed at [facility], [place] [address], [place], [place] [address]  Blood culture (routine x 2)     Status: None   Collection Time: 03/06/19  6:59 PM  Result Value Ref Range Status   Specimen Description BLOOD LEFT HAND  Final   Special Requests   Final    BOTTLES DRAWN AEROBIC AND ANAEROBIC Blood Culture results may not be optimal due to an inadequate volume of blood received in culture bottles   Culture   Final    NO GROWTH 5 DAYS Performed at [facility], [place] [address], [place], [place] [address]    Report Status  03/11/2019 FINAL  Final  Culture, blood (routine x 2)     Status: None   Collection Time: 03/07/19  6:42 PM  Result Value Ref Range Status   Specimen Description BLOOD RIGHT HAND  Final   Special Requests   Final    BOTTLES DRAWN AEROBIC AND ANAEROBIC Blood Culture adequate volume   Culture   Final    NO GROWTH 6 DAYS Performed at [facility], [place] [address] [place], [place] [address]    Report Status 03/13/2019 FINAL  Final  Culture, blood (routine x 2)     Status: None   Collection Time: 03/07/19  6:42 PM  Result Value Ref Range Status   Specimen Description BLOOD LEFT HAND  Final   Special Requests   Final    BOTTLES DRAWN AEROBIC ONLY Blood Culture results may not be optimal due to an inadequate volume of blood received in culture bottles   Culture   Final    NO GROWTH 6 DAYS Performed at [facility], [place] [address], [place], [place] [address]    Report Status 03/13/2019 FINAL  Final         Radiology Studies: No results found.      Scheduled Meds: . [MAR Hold] arformoterol  15 mcg Nebulization BID  . [MAR Hold] bisoprolol  2.5 mg Oral Daily  . [MAR Hold] budesonide (PULMICORT) nebulizer solution  0.5 mg Nebulization BID  . [MAR Hold] docusate sodium  100 mg Oral Daily  . [MAR Hold] doxycycline  100 mg Oral Q12H  . [MAR Hold] ferrous sulfate  325 mg Oral BID WC  . [MAR Hold] folic acid  1 mg Oral Daily  . [MAR Hold] gabapentin  600 mg Oral BID  . [MAR Hold] insulin aspart  0-15 Units Subcutaneous TID WC  . [MAR Hold] insulin aspart  0-5 Units Subcutaneous QHS  . [MAR Hold] insulin aspart  3 Units Subcutaneous TID WC  . [MAR Hold] insulin NPH Human  25 Units Subcutaneous BID AC & HS  . [MAR Hold] ipratropium-albuterol  3 mL Nebulization TID  . [facility]  Hold] isosorbide-hydrALAZINE  1 tablet Oral TID  . [MAR Hold] latanoprost  1 drop Both Eyes QHS  . [MAR Hold] mouth rinse  15 mL Mouth Rinse BID  . [MAR Hold] nicotine  7 mg Transdermal Daily  . [MAR  Hold] pantoprazole  40 mg Oral BID  . [MAR Hold] polyethylene glycol  17 g Oral Daily  . [MAR Hold] potassium chloride  40 mEq Oral TID  . [MAR Hold] predniSONE  30 mg Oral QAC breakfast  . [MAR Hold] rosuvastatin  20 mg Oral q1800  . [MAR Hold] sodium chloride flush  10-40 mL Intracatheter Q12H  . [MAR Hold] sodium chloride flush  3 mL Intravenous Q12H  . [MAR Hold] spironolactone  25 mg Oral Daily   Continuous Infusions: . [MAR Hold] sodium chloride    . sodium chloride 20 mL/hr at 03/15/19 0743  . furosemide (LASIX) infusion 15 mg/hr (03/14/19 1211)  . milrinone 0.25 mcg/kg/min (03/15/19 0009)     LOS: 9 days    Time spent: 35 minutes    Edwin Dada, MD [facility] 03/15/2019, 8:30 AM     Please page through [place]:  [email or web address] Password [email or web address] If 7PM-7AM, please contact night-coverage    Estimated body mass index is 37.14 kg/m as calculated from the following:   Height as of this encounter: 6' (1.829 m).   Weight as of this encounter: 124.2 kg. Malnutrition Type:   Malnutrition Characteristics:   Nutrition Interventions:   Pressure Injury 11/22/18 Stage I -  Intact skin with non-blanchable redness of a localized area usually over a bony prominence. healing pink (Active)  11/22/18 0230  Location: Buttocks  Location Orientation: Right  Staging: Stage I -  Intact skin with non-blanchable redness of a localized area usually over a bony prominence.  Wound Description (Comments): healing pink  Present on Admission: Yes    . [MAR Hold] arformoterol  15 mcg Nebulization BID  . [MAR Hold] bisoprolol  2.5 mg Oral Daily  . [MAR Hold] budesonide (PULMICORT) nebulizer solution  0.5 mg Nebulization BID  . [MAR Hold] docusate sodium  100 mg Oral Daily  . [MAR Hold] doxycycline  100 mg Oral Q12H  . [MAR Hold] ferrous sulfate  325 mg Oral BID WC  . [MAR Hold] folic acid  1 mg Oral Daily  . [MAR Hold] gabapentin  600 mg Oral BID  . [MAR Hold] insulin  aspart  0-15 Units Subcutaneous TID WC  . [MAR Hold] insulin aspart  0-5 Units Subcutaneous QHS  . [MAR Hold] insulin aspart  3 Units Subcutaneous TID WC  . [MAR Hold] insulin NPH Human  25 Units Subcutaneous BID AC & HS  . [MAR Hold] ipratropium-albuterol  3 mL Nebulization TID  . [MAR Hold] isosorbide-hydrALAZINE  1 tablet Oral TID  . [MAR Hold] latanoprost  1 drop Both Eyes QHS  . [MAR Hold] mouth rinse  15 mL Mouth Rinse BID  . [MAR Hold] nicotine  7 mg Transdermal Daily  . [MAR Hold] pantoprazole  40 mg Oral BID  . [MAR Hold] polyethylene glycol  17 g Oral Daily  . [MAR Hold] potassium chloride  40 mEq Oral TID  . [MAR Hold] predniSONE  30 mg Oral QAC breakfast  . [MAR Hold] rosuvastatin  20 mg Oral q1800  . [MAR Hold] sodium chloride flush  10-40 mL Intracatheter Q12H  . [MAR Hold] sodium chloride flush  3 mL Intravenous Q12H  . [MAR Hold] spironolactone  25 mg Oral Daily   . [  MAR Hold] sodium chloride    . sodium chloride 20 mL/hr at 03/15/19 0743  . furosemide (LASIX) infusion 15 mg/hr (03/14/19 1211)  . milrinone 0.25 mcg/kg/min (03/15/19 0009)    Current Meds  Medication Sig  . albuterol (VENTOLIN HFA) 108 (90 Base) MCG/ACT inhaler Inhale 2 puffs into the lungs every 6 (six) hours as needed for wheezing or shortness of breath.  . bisoprolol (ZEBETA) 5 MG tablet Take 0.5 tablets (2.5 mg total) by mouth daily for 30 days.  . budesonide-formoterol (SYMBICORT) 160-4.5 MCG/ACT inhaler Inhale 2 puffs into the lungs 2 (two) times daily.  . diazepam (VALIUM) 5 MG tablet Take 5 mg by mouth daily as needed for anxiety.  . fluticasone (FLONASE) 50 MCG/ACT nasal spray Place 2 sprays into both nostrils daily as needed for allergies.   Marland Kitchen gabapentin (NEURONTIN) 600 MG tablet Take 1 tablet (600 mg total) by mouth 2 (two) times daily.  Marland Kitchen guaifenesin (HUMIBID E) 400 MG TABS tablet Take 1 tablet (400 mg total) by mouth every 6 (six) hours as needed. (Patient taking differently: Take 400 mg by  mouth every 6 (six) hours as needed (cough). )  . hydrocortisone (ANUSOL-HC) 2.5 % rectal cream Place 1 application rectally 2 (two) times daily as needed for hemorrhoids or itching.   . hydrocortisone 1 % lotion Apply 1 application topically See admin instructions. Apply small amount to back twice daily for itchy rash  . insulin aspart protamine - aspart (NOVOLOG MIX 70/30 FLEXPEN) (70-30) 100 UNIT/ML FlexPen Inject 0.05 mLs (5 Units total) into the skin 2 (two) times daily. (Patient taking differently: Inject 30 Units into the skin 2 (two) times daily. )  . ipratropium-albuterol (DUONEB) 0.5-2.5 (3) MG/3ML SOLN Take 3 mLs by nebulization 2 (two) times daily as needed (shortness of breath/wheezing).   . isosorbide-hydrALAZINE (BIDIL) 20-37.5 MG tablet Take 1 tablet by mouth 3 (three) times daily. (Patient taking differently: Take 1 tablet by mouth 2 (two) times a day. )  . latanoprost (XALATAN) 0.005 % ophthalmic solution Place 1 drop into both eyes at bedtime.  . magnesium oxide (MAG-OX) 400 MG tablet Take 400 mg by mouth daily.  . metFORMIN (GLUCOPHAGE-XR) 500 MG 24 hr tablet Take 1,000 mg by mouth 2 (two) times daily with a meal.  . metolazone (ZAROXOLYN) 2.5 MG tablet Take 1 tablet (2.5 mg total) by mouth every Wednesday. Take extra Potassium 36mq (40 total, daily) when taking this medication  . oxyCODONE-acetaminophen (PERCOCET) 10-325 MG tablet Take 1 tablet by mouth every 6 (six) hours.  . OXYGEN Inhale 3 L into the lungs continuous.   . pantoprazole (PROTONIX) 40 MG tablet Take 1 tablet (40 mg total) by mouth 2 (two) times daily.  . polyethylene glycol (MIRALAX / GLYCOLAX) packet Take 17 g by mouth daily. (Patient taking differently: Take 17 g by mouth daily as needed (constipation). )  . potassium chloride SA (K-DUR) 20 MEQ tablet Take 40 mEq by mouth See admin instructions. Take 2 tablets (40 meq) by mouth every morning, take an additional tablet (20 meq) at night if taking metolazone that  day.  . rivaroxaban (XARELTO) 20 MG TABS tablet Take 1 tablet (20 mg total) by mouth daily with supper. (Patient taking differently: Take 20 mg by mouth at bedtime. )  . rosuvastatin (CRESTOR) 40 MG tablet Take 20 mg by mouth daily.  . sodium chloride (OCEAN) 0.65 % SOLN nasal spray Place 2 sprays into both nostrils as needed for congestion.   . torsemide (DEMADEX)  20 MG tablet Take 4 tablets (80 mg total) by mouth 2 (two) times daily for 30 days.   [MAR Hold] sodium chloride, [MAR Hold] acetaminophen, [MAR Hold] fluticasone, [MAR Hold] guaiFENesin, [MAR Hold] ondansetron (ZOFRAN) IV, [MAR Hold] oxyCODONE-acetaminophen, [MAR Hold] sodium chloride flush, [MAR Hold] sodium chloride flush

## 2019-03-15 NOTE — Progress Notes (Signed)
Patient ID: Nicholas Caldwell, male   DOB: Nov 24, 1945, 73 y.o.   MRN: [id number]     Advanced Heart Failure Rounding Note  PCP-Cardiologist: No primary care provider on file.   Subjective:    Milrinone started 5/10 at 0.25. Remains on lasix gtt at 15. Got metolazone yesterday as well. Co-ox inaccurate.  Creatinine up slightly 1.9 -> 2.0 -> 2.25. K 3.3 Weight down 1 pound but doesn't seem accurate as he is only down 2 pounds from admit. CVP 15.  Denies SOB, orthopnea or PND.    When for repeat endoscopy and like has friable, inflammatory gastritits with non-bleeding ulcer. Path negative for malignancy.   He remains in atypical atrial flutter, rate 80s-100s.    No overt bleeding. Hgb 8.3->7.9   CXR 5/5: Pulmonary edema, left base consolidation.   He had 1 blood culture positive for suspected coagulase negative Staph (contaminant). Steroids started 5/6, now on doxycycline as well.    Objective:   Weight Range: 124.2 kg Body mass index is 37.14 kg/m.   Vital Signs:   Temp:  [98.2 F (36.8 C)-98.9 F (37.2 C)] 98.8 F (37.1 C) (05/12 0855) Pulse Rate:  [45-106] 45 (05/12 0905) Resp:  [18-23] 18 (05/12 0905) BP: (104-127)/(49-71) 116/58 (05/12 0905) SpO2:  [94 %-98 %] 98 % (05/12 0905) Weight:  [124.2 kg] 124.2 kg (05/12 0439) Last BM Date: 03/11/19  Weight change: Filed Weights   03/13/19 0923 03/14/19 0529 03/15/19 0439  Weight: 121.2 kg 124.5 kg 124.2 kg    Intake/Output:   Intake/Output Summary (Last 24 hours) at 03/15/2019 1140 Last data filed at 03/15/2019 0846 Gross per 24 hour  Intake 1036.12 ml  Output 4225 ml  Net -3188.88 ml      Physical Exam    General:  Sitting in chair. No resp difficulty HEENT: normal Neck: supple.JVP to ear. Carotids 2+ bilat; no bruits. No lymphadenopathy or thryomegaly appreciated. Cor: PMI nondisplaced. Irregular No rubs, gallops or murmurs. Lungs: clear Abdomen: soft, nontender, nondistended. No hepatosplenomegaly. No bruits or  masses. Good bowel sounds. Extremities: no cyanosis, clubbing, rash, 1+ edema Neuro: alert & orientedx3, cranial nerves grossly intact. moves all 4 extremities w/o difficulty. Affect pleasant   Telemetry   Atypical atrial flutter rate in 80s-100s (personally reviewed).   Labs    CBC Recent Labs    03/13/19 0416 03/15/19 0416  WBC 7.1 11.6*  NEUTROABS 6.1  --   HGB 7.9* 7.9*  HCT 26.5* 26.3*  MCV 84.7 85.1  PLT 379 409   Basic Metabolic Panel Recent Labs    03/14/19 0734 03/14/19 1619 03/15/19 0416  NA  --  130* 132*  K  --  3.6 3.3*  CL  --  75* 82*  CO2  --  37* 37*  GLUCOSE  --  211* 258*  BUN  --  69* 75*  CREATININE  --  2.09* 2.25*  CALCIUM  --  8.6* 8.5*  MG 1.9  --   --    Liver Function Tests No results for input(s): AST, ALT, ALKPHOS, BILITOT, PROT, ALBUMIN in the last 72 hours. No results for input(s): LIPASE, AMYLASE in the last 72 hours. Cardiac Enzymes No results for input(s): CKTOTAL, CKMB, CKMBINDEX, TROPONINI in the last 72 hours.  BNP: BNP (last 3 results) Recent Labs    12/01/18 0015 12/30/18 1621 03/06/19 1829  BNP 1,666.7* 1,273.0* 1,020.1*    ProBNP (last 3 results) No results for input(s): PROBNP in the last 8760 hours.   D-Dimer  No results for input(s): DDIMER in the last 72 hours. Hemoglobin A1C No results for input(s): HGBA1C in the last 72 hours. Fasting Lipid Panel No results for input(s): CHOL, HDL, LDLCALC, TRIG, CHOLHDL, LDLDIRECT in the last 72 hours. Thyroid Function Tests No results for input(s): TSH, T4TOTAL, T3FREE, THYROIDAB in the last 72 hours.  Invalid input(s): FREET3  Other results:   Imaging    No results found.   Medications:     Scheduled Medications: . arformoterol  15 mcg Nebulization BID  . bisoprolol  2.5 mg Oral Daily  . budesonide (PULMICORT) nebulizer solution  0.5 mg Nebulization BID  . docusate sodium  100 mg Oral Daily  . doxycycline  100 mg Oral Q12H  . ferrous sulfate  325  mg Oral BID WC  . folic acid  1 mg Oral Daily  . gabapentin  600 mg Oral BID  . insulin aspart  0-15 Units Subcutaneous TID WC  . insulin aspart  0-5 Units Subcutaneous QHS  . insulin aspart  3 Units Subcutaneous TID WC  . insulin NPH Human  25 Units Subcutaneous BID AC & HS  . ipratropium-albuterol  3 mL Nebulization TID  . isosorbide-hydrALAZINE  1 tablet Oral TID  . latanoprost  1 drop Both Eyes QHS  . mouth rinse  15 mL Mouth Rinse BID  . nicotine  7 mg Transdermal Daily  . pantoprazole  40 mg Oral BID  . polyethylene glycol  17 g Oral Daily  . potassium chloride  40 mEq Oral TID  . predniSONE  30 mg Oral QAC breakfast  . rosuvastatin  20 mg Oral q1800  . sodium chloride flush  10-40 mL Intracatheter Q12H  . sodium chloride flush  3 mL Intravenous Q12H  . spironolactone  25 mg Oral Daily    Infusions: . sodium chloride    . furosemide (LASIX) infusion 15 mg/hr (03/15/19 1021)  . milrinone 0.25 mcg/kg/min (03/15/19 1022)    PRN Medications: sodium chloride, acetaminophen, fluticasone, guaiFENesin, ondansetron (ZOFRAN) IV, oxyCODONE-acetaminophen, sodium chloride flush, sodium chloride flush   Assessment/Plan   1. Acute on chronic systolic CHF: Nonischemic cardiomyopathy.  With prominent RV failure, likely related to COPD/OSA/OHS. TEE in 3/20 with EF 40-45%, severely dilated RV with moderately decreased systolic function. He has had multiple recent admissions with CHF and COPD exacerbations.  He has not cooperated with paramedicine or [facility] home health as outpatient, has not been using CPAP per his wife, suspect significant medication noncompliance as well though he denies.   - Milrinone started 5/9. Now on 0.25 co-ox 71% - He remains on lasix drip with marked volume overload. CVP 21-> 18 -> 15 - Creatinine rising but still with volume on board.  - Will decrease Lasix to 10 mg/hr and hold metolazone and follow creatinine closely. - Continue UNNA boots - Continue bisoprolol  2.5 daily.  - Continue Bidil 1 tab tid.  - Continue spironolactone 25 mg daily.    - Cardiomems may be helpful as outpatient.  2. Anemia: Baseline hgb around 8.  6.7 at admission, denies overt GI bleeding.  He has had 2 unit PRBCs and IV Fe.  Hgb 7.9 today, lower but no overt bleeding.  Colonoscopy 5/8 unremarkable but EGD showed necrotic mucosa gastric fundus/body that is concerning for gastric cancer, biopsies taken.  - Hgb relatively stable at 8.3-> 7.9.  - Gastric biopsy negative for malignancy. Repeat endoscopy today showed inflammatory and friable gastric mucosa and a non bleeding ulcer.   - If  hgb stable will attempt to restart Eliquis tomorrow  - IV iron per primary team and GI 3. Atrial fibrillation/flutter: Patient has history of paroxysmal atrial fibrillation.  This admission, he appears to be in an atypical atrial flutter.  Rate is currently controlled. Xarelto on hold with GI bleeding.  Was seen in past by Dr. Rayann Heman, not thought to be good ablation candidate.  He has not been on Tikosyn or amiodarone due to history of long QT.  - Would like to do TEE-guided DCCV - As above, restart Eliquis tomorrow. If no bleeding time 48 hours possible TEE/DCCV on Friday with [name] - Will eventually ask EP to reconsider ablation given lack of good anti-arrhythmic options. He seems to do better in NSR.  4. COPD: Severe, on 3L home oxygen.  He was wheezing initially but now much improved.  This may be due to pulmonary edema, but also consider component of COPD exacerbation.  - Getting Duonebs.  - He is now on prednisone and doxycycline 100 mg bid.   - Per primary team  5. OHS/OSA: On home oxygen.  Per wife, has not been using CPAP at home.  Will give CPAP at night here.  6. ID: Suspect this is primarily a CHF exacerbation.  Afebrile and WBCs not significantly elevated.  1 blood culture positive for what appears to be coagulase negative Staph (probably contaminant).  COVID-19 negative.  7. CAD:  Nonobstructive on prior cath.  No s/s ischemia. He is on Crestor. Off ASA with DOAC and GI bleed  8. Acute on chronic kidney failure III - Bseline seems to be 1.3-1.6 - Creatinine up slightly today. 1.9-> 2.2. Plan as above   Length of Stay: [place], MD  03/15/2019, 11:40 AM  Advanced Heart Failure Team Pager [phone] (M-F; [place])  Please contact [facility] for night-coverage after hours (4p -7a ) and weekends on [email or web address]

## 2019-03-15 NOTE — Progress Notes (Signed)
PT Cancellation Note  Patient Details Name: Nicholas Caldwell MRN: [id number] DOB: 1946/10/16   Cancelled Treatment:    Reason Eval/Treat Not Completed: Patient at procedure or test/unavailable. Will follow-up for PT treatment as schedule permits.  Mabeline Caras, PT, DPT Acute Rehabilitation Services  Pager [phone] Office [place] 03/15/2019, 7:31 AM

## 2019-03-15 NOTE — Anesthesia Postprocedure Evaluation (Signed)
Anesthesia Post Note  Patient: Antoino Westhoff  Procedure(s) Performed: ENTEROSCOPY (N/A ) SUBMUCOSAL TATTOO INJECTION BIOPSY     Patient location during evaluation: Endoscopy Anesthesia Type: MAC Level of consciousness: awake and alert, patient cooperative and oriented Pain management: pain level controlled Vital Signs Assessment: post-procedure vital signs reviewed and stable Respiratory status: spontaneous breathing, nonlabored ventilation and respiratory function stable Cardiovascular status: blood pressure returned to baseline and stable Postop Assessment: no apparent nausea or vomiting Anesthetic complications: no    Last Vitals:  Vitals:   03/15/19 0855 03/15/19 0905  BP: (!) 108/51 (!) 116/58  Pulse: (!) 102 (!) 45  Resp: 20 18  Temp: 37.1 C   SpO2: 97% 98%    Last Pain:  Vitals:   03/15/19 0905  TempSrc:   PainSc: 4                  Kymorah Korf,E. Ronnie Doo

## 2019-03-16 ENCOUNTER — Encounter (HOSPITAL_COMMUNITY): Payer: Self-pay | Admitting: Gastroenterology

## 2019-03-16 LAB — BASIC METABOLIC PANEL
Anion gap: 11 (ref 5–15)
BUN: 75 mg/dL — ABNORMAL HIGH (ref 8–23)
CO2: 40 mmol/L — ABNORMAL HIGH (ref 22–32)
Calcium: 8.5 mg/dL — ABNORMAL LOW (ref 8.9–10.3)
Chloride: 83 mmol/L — ABNORMAL LOW (ref 98–111)
Creatinine, Ser: 2.1 mg/dL — ABNORMAL HIGH (ref 0.61–1.24)
GFR calc Af Amer: 36 mL/min — ABNORMAL LOW (ref >60)
GFR calc non Af Amer: 31 mL/min — ABNORMAL LOW (ref >60)
Glucose, Bld: 291 mg/dL — ABNORMAL HIGH (ref 70–99)
Potassium: 3.4 mmol/L — ABNORMAL LOW (ref 3.5–5.1)
Sodium: 134 mmol/L — ABNORMAL LOW (ref 135–145)

## 2019-03-16 LAB — CBC
HCT: 27.7 % — ABNORMAL LOW (ref 39.0–52.0)
Hemoglobin: 8.1 g/dL — ABNORMAL LOW (ref 13.0–17.0)
MCH: 24.7 pg — ABNORMAL LOW (ref 26.0–34.0)
MCHC: 29.2 g/dL — ABNORMAL LOW (ref 30.0–36.0)
MCV: 84.5 fL (ref 80.0–100.0)
Platelets: 308 10*3/uL (ref 150–400)
RBC: 3.28 MIL/uL — ABNORMAL LOW (ref 4.22–5.81)
RDW: 23.7 % — ABNORMAL HIGH (ref 11.5–15.5)
WBC: 9.9 10*3/uL (ref 4.0–10.5)
nRBC: 0.2 % (ref 0.0–0.2)

## 2019-03-16 LAB — GLUCOSE, CAPILLARY
Glucose-Capillary: 202 mg/dL — ABNORMAL HIGH (ref 70–99)
Glucose-Capillary: 335 mg/dL — ABNORMAL HIGH (ref 70–99)
Glucose-Capillary: 255 mg/dL — ABNORMAL HIGH (ref 70–99)
Glucose-Capillary: 562 mg/dL (ref 70–99)

## 2019-03-16 LAB — HEPARIN LEVEL (UNFRACTIONATED): Heparin Unfractionated: 0.21 IU/mL — ABNORMAL LOW (ref 0.30–0.70)

## 2019-03-16 MED ORDER — INSULIN ASPART 100 UNIT/ML ~~LOC~~ SOLN
8.0000 [IU] | Freq: Three times a day (TID) | SUBCUTANEOUS | Status: DC
  Administered 2019-03-17: 8 [IU] via SUBCUTANEOUS

## 2019-03-16 MED ORDER — INSULIN NPH (HUMAN) (ISOPHANE) 100 UNIT/ML ~~LOC~~ SUSP
30.0000 [IU] | Freq: Two times a day (BID) | SUBCUTANEOUS | Status: DC
  Administered 2019-03-16 – 2019-03-17 (×2): 30 [IU] via SUBCUTANEOUS

## 2019-03-16 MED ORDER — DIAZEPAM 5 MG PO TABS
5.0000 mg | ORAL_TABLET | Freq: Every day | ORAL | Status: DC | PRN
  Administered 2019-03-16: 5 mg via ORAL
  Filled 2019-03-16: qty 1

## 2019-03-16 MED ORDER — ACETAZOLAMIDE 250 MG PO TABS
250.0000 mg | ORAL_TABLET | Freq: Two times a day (BID) | ORAL | Status: DC
  Administered 2019-03-16 – 2019-03-20 (×10): 250 mg via ORAL
  Filled 2019-03-16 (×10): qty 1

## 2019-03-16 MED ORDER — INSULIN ASPART 100 UNIT/ML ~~LOC~~ SOLN
8.0000 [IU] | Freq: Once | SUBCUTANEOUS | Status: AC
  Administered 2019-03-16: 8 [IU] via SUBCUTANEOUS

## 2019-03-16 MED ORDER — POTASSIUM CHLORIDE CRYS ER 20 MEQ PO TBCR
40.0000 meq | EXTENDED_RELEASE_TABLET | Freq: Three times a day (TID) | ORAL | Status: DC
  Administered 2019-03-16 (×2): 40 meq via ORAL
  Filled 2019-03-16 (×2): qty 2

## 2019-03-16 NOTE — Progress Notes (Signed)
Patient ID: Nicholas Caldwell, male   DOB: [birth year], 73 y.o.   MRN: [id number]     Advanced Heart Failure Rounding Note  PCP-Cardiologist: No primary care provider on file.   Subjective:    Milrinone started 5/10 at 0.25.   Yesterday lasix gtt cut from 15-> 10 and metolazone stopped as creatinine was rising. Continues to have good urine output but CVP up 15 -> 16. Weight up 4 pounds. Drinking a lot of fluid. Creatinine 2.25-> 2.10. Bicarb up to 40. No co-ox today but was 70% yesterday.  Hgb stable. No melena or ab pain. No sob, orthopnea or PND.  Plan to start low-dose heparin today.   Objective:   Weight Range: 125.8 kg Body mass index is 37.61 kg/m.   Vital Signs:   Temp:  [97.9 F (36.6 C)-98.8 F (37.1 C)] 98.8 F (37.1 C) (05/13 0751) Pulse Rate:  [91-120] 120 (05/13 0751) Resp:  [16-25] 25 (05/13 0751) BP: (110-121)/(58-66) 112/64 (05/13 0751) SpO2:  [94 %-99 %] 96 % (05/13 0830) Weight:  [125.8 kg] 125.8 kg (05/13 0346) Last BM Date: 03/15/19  Weight change: Filed Weights   03/14/19 0529 03/15/19 0439 03/16/19 0346  Weight: 124.5 kg 124.2 kg 125.8 kg    Intake/Output:   Intake/Output Summary (Last 24 hours) at 03/16/2019 0925 Last data filed at 03/16/2019 0753 Gross per 24 hour  Intake 1412.64 ml  Output 5150 ml  Net -3737.36 ml      Physical Exam    General:  Obese male sitting in recliner . No resp difficulty HEENT: normal Neck: supple. JVP to ear. Carotids 2+ bilat; no bruits. No lymphadenopathy or thryomegaly appreciated. Cor: PMI nondisplaced. Irregular rate & rhythm.2/6 TR Lungs: clear Abdomen: obese soft, nontender, nondistended. No hepatosplenomegaly. No bruits or masses. Good bowel sounds. Extremities: no cyanosis, clubbing, rash, trace edema + UNNA boots RUE PICC Neuro: alert & orientedx3, cranial nerves grossly intact. moves all 4 extremities w/o difficulty. Affect pleasant    Telemetry   Atypical atrial flutter rate in 80s-110s (personally  reviewed).   Labs    CBC Recent Labs    03/15/19 0416 03/16/19 0430  WBC 11.6* 9.9  HGB 7.9* 8.1*  HCT 26.3* 27.7*  MCV 85.1 84.5  PLT 308 811   Basic Metabolic Panel Recent Labs    03/14/19 0734  03/15/19 0416 03/16/19 0430  NA  --    < > 132* 134*  K  --    < > 3.3* 3.4*  CL  --    < > 82* 83*  CO2  --    < > 37* 40*  GLUCOSE  --    < > 258* 291*  BUN  --    < > 75* 75*  CREATININE  --    < > 2.25* 2.10*  CALCIUM  --    < > 8.5* 8.5*  MG 1.9  --   --   --    < > = values in this interval not displayed.   Liver Function Tests No results for input(s): AST, ALT, ALKPHOS, BILITOT, PROT, ALBUMIN in the last 72 hours. No results for input(s): LIPASE, AMYLASE in the last 72 hours. Cardiac Enzymes No results for input(s): CKTOTAL, CKMB, CKMBINDEX, TROPONINI in the last 72 hours.  BNP: BNP (last 3 results) Recent Labs    12/01/18 0015 12/30/18 1621 03/06/19 1829  BNP 1,666.7* 1,273.0* 1,020.1*    ProBNP (last 3 results) No results for input(s): PROBNP in the last 8760 hours.  D-Dimer No results for input(s): DDIMER in the last 72 hours. Hemoglobin A1C No results for input(s): HGBA1C in the last 72 hours. Fasting Lipid Panel No results for input(s): CHOL, HDL, LDLCALC, TRIG, CHOLHDL, LDLDIRECT in the last 72 hours. Thyroid Function Tests No results for input(s): TSH, T4TOTAL, T3FREE, THYROIDAB in the last 72 hours.  Invalid input(s): FREET3  Other results:   Imaging    No results found.   Medications:     Scheduled Medications: . acetylcysteine  2 mL Nebulization BID  . arformoterol  15 mcg Nebulization BID  . bisoprolol  2.5 mg Oral Daily  . budesonide (PULMICORT) nebulizer solution  0.5 mg Nebulization BID  . docusate sodium  100 mg Oral Daily  . doxycycline  100 mg Oral Q12H  . ferrous sulfate  325 mg Oral BID WC  . folic acid  1 mg Oral Daily  . gabapentin  600 mg Oral BID  . guaiFENesin  400 mg Oral BID  . insulin aspart  0-20 Units  Subcutaneous TID WC  . insulin aspart  0-5 Units Subcutaneous QHS  . insulin aspart  3 Units Subcutaneous TID WC  . insulin NPH Human  25 Units Subcutaneous BID AC & HS  . ipratropium-albuterol  3 mL Nebulization TID  . isosorbide-hydrALAZINE  1 tablet Oral TID  . latanoprost  1 drop Both Eyes QHS  . mouth rinse  15 mL Mouth Rinse BID  . nicotine  7 mg Transdermal Daily  . pantoprazole  40 mg Oral BID  . polyethylene glycol  17 g Oral Daily  . potassium chloride  40 mEq Oral TID  . [START ON 03/17/2019] predniSONE  20 mg Oral Q breakfast   Followed by  . [START ON 03/19/2019] predniSONE  10 mg Oral Q breakfast  . rosuvastatin  20 mg Oral q1800  . sodium chloride flush  10-40 mL Intracatheter Q12H  . sodium chloride flush  3 mL Intravenous Q12H  . spironolactone  25 mg Oral Daily    Infusions: . sodium chloride    . furosemide (LASIX) infusion 10 mg/hr (03/15/19 1234)  . heparin    . milrinone 0.25 mcg/kg/min (03/16/19 0916)    PRN Medications: sodium chloride, acetaminophen, fluticasone, ondansetron (ZOFRAN) IV, oxyCODONE-acetaminophen, sodium chloride flush, sodium chloride flush   Assessment/Plan   1. Acute on chronic systolic CHF: Nonischemic cardiomyopathy.  With prominent RV failure, likely related to COPD/OSA/OHS. TEE in 3/20 with EF 40-45%, severely dilated RV with moderately decreased systolic function. He has had multiple recent admissions with CHF and COPD exacerbations.  He has not cooperated with paramedicine or [facility] home health as outpatient, has not been using CPAP per his wife, suspect significant medication noncompliance as well though he denies.   - Milrinone started 5/9. Now on 0.25 co-ox 71% - He remains on lasix drip with marked volume overload. CVP 21-> 18 -> 15 -> 16  - Lasix cut back to 10 mg/hr due to AKI. Wil continue at this rate. Add diamox and fluid restrict. Discussed need to limit fluid intake in detail - Continue UNNA boots - Continue bisoprolol  2.5 daily.  - Continue Bidil 1 tab tid.  - Continue spironolactone 25 mg daily.    - Cardiomems may be helpful as outpatient.  2. Anemia: Baseline hgb around 8.  6.7 at admission, denies overt GI bleeding.  He has had 2 unit PRBCs and IV Fe.  Hgb 7.9 today, lower but no overt bleeding.  Colonoscopy 5/8 unremarkable but  EGD showed necrotic mucosa gastric fundus/body that is concerning for gastric cancer, biopsies taken.  - Hgb relatively stable at 8.3-> 7.9. -> 8.1 - Gastric biopsy negative for malignancy. Repeat endoscopy 5/12 showed inflammatory and friable gastric mucosa and a non bleeding ulcer.   - Per [facility] will start low-dose heparin today. If tolerates it will transition to Eliquis tomorrow - IV iron per primary team and GI 3. Atrial fibrillation/flutter: Patient has history of paroxysmal atrial fibrillation.  This admission, he appears to be in an atypical atrial flutter.  Rate is currently controlled. Xarelto on hold with GI bleeding.  Was seen in past by Dr. Rayann Heman, not thought to be good ablation candidate.  He has not been on Tikosyn or amiodarone due to history of long QT.  - Would like to do TEE-guided DCCV - As above, will start low-dose heparin today. If tolerates it will transition to Eliquis tomorrow. Will need at least 3 doses prior to possible TEE/DCCV on Friday with [name] - Will eventually ask EP to reconsider ablation given lack of good anti-arrhythmic options. He seems to do better in NSR.  4. COPD: Severe, on 3L home oxygen.  He was wheezing initially but now much improved.  This may be due to pulmonary edema, but also consider component of COPD exacerbation.  - Getting Duonebs.  - He is now on prednisone and doxycycline 100 mg bid.   - Per primary team  5. OHS/OSA: On home oxygen.  Per wife, has not been using CPAP at home.  Will give CPAP at night here.  6. ID: Suspect this is primarily a CHF exacerbation.  Afebrile and WBCs not significantly elevated.  1 blood culture  positive for what appears to be coagulase negative Staph (probably contaminant).  COVID-19 negative.  7. CAD: Nonobstructive on prior cath.  No s/s ischemia. He is on Crestor. Off ASA with DOAC and GI bleed  8. Acute on chronic kidney failure III - Bseline seems to be 1.3-1.6 - Creatinine back down today. 1.9-> 2.2 -> 2.1. Plan as above   Length of Stay: [place], MD  03/16/2019, 9:25 AM  Advanced Heart Failure Team Pager [phone] (M-F; 7a - 4p)  Please contact [facility] for night-coverage after hours (4p -7a ) and weekends on [email or web address]

## 2019-03-16 NOTE — Progress Notes (Addendum)
PROGRESS NOTE    Nicholas Caldwell  WFU:[id number] DOB: July 10, 1946 DOA: 03/06/2019 PCP: [facility]   Brief Narrative:   Mr. Shimabukuro is a 73 y.o. M with CAD, isch CM EF 40%, sdCHF, pAF on Xarelto, pHTN, MO, OSA not compliant with CPAP, COPD on home O2 3L, and smoking who presented with swelling and dyspnea.  Patient had had slow progressive dyspnea, SOB with exertion, also significant peripheral edema.  In ER, BNP >1000, CXR with edema, HR 124 in Afib, and new anemia Hgb 6.7 g/dL. Transfused and started on Lasix.   Assessment & Plan:   Principal Problem:   Acute on chronic respiratory failure with hypoxia and hypercapnia (HCC) Active Problems:   Essential hypertension   Type 2 diabetes mellitus with neuropathy   PAF (paroxysmal atrial fibrillation) (HCC)   COPD (chronic obstructive pulmonary disease) (HCC)   Medically noncompliant   Atrial fibrillation with RVR (HCC)   AKI (acute kidney injury) ([place])   Acute on chronic anemia   Acute on chronic systolic (congestive) heart failure (HCC)   Acute on chronic systolic CHF (congestive heart failure) (HCC)   Iron deficiency anemia   NSVT (nonsustained ventricular tachycardia) (HCC)   Tobacco abuse counseling   Gastritis and gastroduodenitis   Acute on chronic hypoxic respiratory failure Patient presenting from home with progressive dyspnea associated with worsening lower extremity edema.  Likely multifactorial from acute CHF exacerbation and COPD flare, as well as anemia, NSVT/AF.  Chest x-ray on admission notable for bilateral interstitial and alveolar airspace opacities with cardiomegaly. --Now titrated down to home O2 at 3 L per nasal cannula --Continue supportive care  Acute on chronic systolic and diastolic CHF EF 20-25% on TEE in Mar.   --Cardiology following, appreciate assistance --Net negative 2.8L yesterday; net negative 20.5L since admission --Milrinone, Acetazolamide and Lasix gtt per Cardiology --Continue  spironolactone --Continue BiDIL --Continue Crestor --Continue BB --Cardiac, low-salt diet, fluid restrict to 1200 mL's per day  Atrial fibrillation/flutter Continues with persistent A. fib/flutter on telemetry.  Cardiology following, appreciate assistance. --Started on heparin drip on 03/15/2019; to transition to Eliquis on 03/17/2019 if hemoglobin remained stable and no signs of bleeding --Cardiology plans TEE/DCCV on Friday --continue beta-blocker --Continue to monitor on telemetry  Acute kidney injury Creatinine baseline 1.2 for the last 6 months, roughly, trending up from 0.9 last few years.  AKI again this hospitalization, has been increasing last few days with diuresis. --Cr 2.25-->2.10 today --Lasix drip rate decreased by cardiology, continues on acetazolamide and milrinone --Daily BMP  Iron deficiency anemia Suspected chronic GI blood loss from gastric ulcer.  Received 2 units PRBCs during the hospitalization. EGD 5/12 with multiple nonbleeding erosions, one nonbleeding superficial gastric ulcer with clean ulcer base --s/p Feraheme 5/4, 5/12, and repeat in one week --Continue protonix 78m BID x 12 weeks --Minimize NSAID use --Darted heparin drip, if no rebleeding will start Eliquis on 5/14; pharmacy assisting with management  Acute COPD exacerbation Patient with home O2 dependence at 3 L per nasal cannula. --Continue prednisone taper --Doxycycline 100 mg p.o. twice daily, plan 7-day course --Pulmicort/Brovana --Continue nebs --Chest PT, Mucinex, Mucomyst prn --Continue to titrate supplemental oxygen to maintain SPO2 greater than 88%  Positive blood culture CoNS in 1/2.  Likely contaminant.  Hypokalemia -K 3.4 today, Replete K today --Repeat electrolytes with magnesium in the a.m.  Diabetes Hemoglobin A1c 6.5 on 11/13/2018.  Home regimen includes metformin 1000 mg p.o. twice daily, NPH 70-30 5u BID. Glucose elevated to 291 this AM, likely  exacerbated by steroid  use. --increase NPH from 25u to 30u SQ BID --increase Novolog from 3u to [facility] --high dose Insulin sliding scale for further coverage --Hopeful glucose will improve as steroids being tapered off  Hypertension Coronary disease secondary prevention -Continue Crestor, BB  Paroxysmal atrial fibrillation NSVT CHA2DS2-Vasc 4 (Age, CHF, PVD, DM).   -Restart anticoagulation with heparin to monitor for bleeding, if none, can resume Eliquis or Xarelto by 5/14 per Cardiology -Continue BB  OHS/OSA/MO --Noncompliant with CPAP at home per report   DVT prophylaxis:  Heparin drip Code Status: FULL CODE Family Communication: none Disposition Plan: inpatient, plan TEE/DCCV on Friday   Consultants:   Cardiology  Procedures:   EGD 03/15/2019  Antimicrobials:   Doxycycline   Subjective: Patient seen and examined at bedside, continues with mild dyspnea, although improving but not back to his normal baseline.  No other complaints at this time.  Denies headache, no chest pain, no palpitations, no nausea/vomiting/diarrhea, no fever/chills/night sweats, no abdominal pain.  No acute events overnight per nursing staff.  Objective: Vitals:   03/16/19 0751 03/16/19 0800 03/16/19 0830 03/16/19 1122  BP: 112/64   102/62  Pulse: (!) 120 (!) 110    Resp: (!) 25 (!) 25  (!) 27  Temp: 98.8 F (37.1 C)   98.5 F (36.9 C)  TempSrc: Axillary   Oral  SpO2: 96% 95% 96%   Weight:      Height:        Intake/Output Summary (Last 24 hours) at 03/16/2019 1329 Last data filed at 03/16/2019 1124 Gross per 24 hour  Intake 1172.64 ml  Output 4900 ml  Net -3727.36 ml   Filed Weights   03/14/19 0529 03/15/19 0439 03/16/19 0346  Weight: 124.5 kg 124.2 kg 125.8 kg    Examination:  General exam: Appears calm and comfortable  Respiratory system: Breath sounds slightly decreased bilateral bases with crackles, normal respiratory effort on 3 L nasal cannula Cardiovascular system: S1 & S2 heard,  irregularly irregular rhythm, normal rate. No JVD, murmurs, rubs, gallops or clicks.  2+ pedal edema bilaterally Gastrointestinal system: Abdomen is nondistended, soft and nontender. No organomegaly or masses felt. Normal bowel sounds heard. Central nervous system: Alert and oriented. No focal neurological deficits. Extremities: Symmetric 5 x 5 power. Skin: No rashes, lesions or ulcers Psychiatry: Judgement and insight appear normal. Mood & affect appropriate.     Data Reviewed: I have personally reviewed following labs and imaging studies  CBC: Recent Labs  Lab 03/10/19 0539 03/11/19 0630 03/12/19 0217 03/13/19 0416 03/15/19 0416 03/16/19 0430  WBC 9.0 9.2 10.5 7.1 11.6* 9.9  NEUTROABS 8.6* 8.5* 8.3* 6.1  --   --   HGB 8.0* 8.3* 8.4* 7.9* 7.9* 8.1*  HCT 28.0* 27.8* 28.4* 26.5* 26.3* 27.7*  MCV 86.4 84.5 83.3 84.7 85.1 84.5  PLT 501* 489* 447* 379 308 272   Basic Metabolic Panel: Recent Labs  Lab 03/11/19 0630  03/13/19 0416 03/14/19 0506 03/14/19 0734 03/14/19 1619 03/15/19 0416 03/16/19 0430  NA 140   < > 132* 136  --  130* 132* 134*  K 2.6*   < > 3.2* 2.8*  --  3.6 3.3* 3.4*  CL 82*   < > 77* 84*  --  75* 82* 83*  CO2 41*   < > 39* 34*  --  37* 37* 40*  GLUCOSE 293*   < > 493* 282*  --  211* 258* 291*  BUN 49*   < > 57*  64*  --  69* 75* 75*  CREATININE 1.73*   < > 1.92* 1.92*  --  2.09* 2.25* 2.10*  CALCIUM 8.7*   < > 8.2* 7.9*  --  8.6* 8.5* 8.5*  MG 2.2  --   --   --  1.9  --   --   --    < > = values in this interval not displayed.   GFR: Estimated Creatinine Clearance: 44.9 mL/min (A) (by C-G formula based on SCr of 2.1 mg/dL (H)). Liver Function Tests: No results for input(s): AST, ALT, ALKPHOS, BILITOT, PROT, ALBUMIN in the last 168 hours. No results for input(s): LIPASE, AMYLASE in the last 168 hours. No results for input(s): AMMONIA in the last 168 hours. Coagulation Profile: No results for input(s): INR, PROTIME in the last 168 hours. Cardiac  Enzymes: No results for input(s): CKTOTAL, CKMB, CKMBINDEX, TROPONINI in the last 168 hours. BNP (last 3 results) No results for input(s): PROBNP in the last 8760 hours. HbA1C: No results for input(s): HGBA1C in the last 72 hours. CBG: Recent Labs  Lab 03/15/19 1624 03/15/19 1628 03/15/19 2100 03/16/19 0630 03/16/19 1126  GLUCAP 522* 507* 277* 202* 335*   Lipid Profile: No results for input(s): CHOL, HDL, LDLCALC, TRIG, CHOLHDL, LDLDIRECT in the last 72 hours. Thyroid Function Tests: No results for input(s): TSH, T4TOTAL, FREET4, T3FREE, THYROIDAB in the last 72 hours. Anemia Panel: No results for input(s): VITAMINB12, FOLATE, FERRITIN, TIBC, IRON, RETICCTPCT in the last 72 hours. Sepsis Labs: No results for input(s): PROCALCITON, LATICACIDVEN in the last 168 hours.  Recent Results (from the past 240 hour(s))  SARS Coronavirus 2 (CEPHEID- Performed in [place] hospital lab), Hosp Order     Status: None   Collection Time: 03/06/19  6:29 PM  Result Value Ref Range Status   SARS Coronavirus 2 NEGATIVE NEGATIVE Final    Comment: (NOTE) If result is NEGATIVE SARS-CoV-2 target nucleic acids are NOT DETECTED. The SARS-CoV-2 RNA is generally detectable in upper and lower  respiratory specimens during the acute phase of infection. The lowest  concentration of SARS-CoV-2 viral copies this assay can detect is 250  copies / mL. A negative result does not preclude SARS-CoV-2 infection  and should not be used as the sole basis for treatment or other  patient management decisions.  A negative result may occur with  improper specimen collection / handling, submission of specimen other  than nasopharyngeal swab, presence of viral mutation(s) within the  areas targeted by this assay, and inadequate number of viral copies  (<250 copies / mL). A negative result must be combined with clinical  observations, patient history, and epidemiological information. If result is POSITIVE SARS-CoV-2  target nucleic acids are DETECTED. The SARS-CoV-2 RNA is generally detectable in upper and lower  respiratory specimens dur ing the acute phase of infection.  Positive  results are indicative of active infection with SARS-CoV-2.  Clinical  correlation with patient history and other diagnostic information is  necessary to determine patient infection status.  Positive results do  not rule out bacterial infection or co-infection with other viruses. If result is PRESUMPTIVE POSTIVE SARS-CoV-2 nucleic acids MAY BE PRESENT.   A presumptive positive result was obtained on the submitted specimen  and confirmed on repeat testing.  While 2019 novel coronavirus  (SARS-CoV-2) nucleic acids may be present in the submitted sample  additional confirmatory testing may be necessary for epidemiological  and / or clinical management purposes  to differentiate between  SARS-CoV-2  and other Sarbecovirus currently known to infect humans.  If clinically indicated additional testing with an alternate test  methodology [id number]) is advised. The SARS-CoV-2 RNA is generally  detectable in upper and lower respiratory sp ecimens during the acute  phase of infection. The expected result is Negative. Fact Sheet for Patients:  StrictlyIdeas.no Fact Sheet for Healthcare Providers: BankingDealers.co.za This test is not yet approved or cleared by the [place] FDA and has been authorized for detection and/or diagnosis of SARS-CoV-2 by FDA under an Emergency Use Authorization (EUA).  This EUA will remain in effect (meaning this test can be used) for the duration of the COVID-19 declaration under Section 564(b)(1) of the Act, 21 U.S.C. section 360bbb-3(b)(1), unless the authorization is terminated or revoked sooner. Performed at [facility], [place] [address] [place], [place] [address]   Urine culture     Status: Abnormal   Collection Time: 03/06/19  6:29 PM   Result Value Ref Range Status   Specimen Description URINE, RANDOM  Final   Special Requests NONE  Final   Culture (A)  Final    <10,000 COLONIES/mL INSIGNIFICANT GROWTH Performed at [facility], [place] [address], [place], [place] [address]    Report Status 03/07/2019 FINAL  Final  Blood culture (routine x 2)     Status: Abnormal   Collection Time: 03/06/19  6:54 PM  Result Value Ref Range Status   Specimen Description BLOOD RIGHT ANTECUBITAL  Final   Special Requests   Final    BOTTLES DRAWN AEROBIC AND ANAEROBIC Blood Culture adequate volume   Culture  Setup Time   Final    GRAM POSITIVE COCCI IN BOTH AEROBIC AND ANAEROBIC BOTTLES CRITICAL RESULT CALLED TO, READ BACK BY AND VERIFIED WITH: PHARMD T DANG [id number] MLM    Culture (A)  Final    STAPHYLOCOCCUS SPECIES (COAGULASE NEGATIVE) THE SIGNIFICANCE OF ISOLATING THIS ORGANISM FROM A SINGLE SET OF BLOOD CULTURES WHEN MULTIPLE SETS ARE DRAWN IS UNCERTAIN. PLEASE NOTIFY THE MICROBIOLOGY DEPARTMENT WITHIN ONE WEEK IF SPECIATION AND SENSITIVITIES ARE REQUIRED. Performed at [facility], [place] [address], [place], [place] [address]    Report Status 03/09/2019 FINAL  Final  Blood Culture ID Panel (Reflexed)     Status: Abnormal   Collection Time: 03/06/19  6:54 PM  Result Value Ref Range Status   Enterococcus species NOT DETECTED NOT DETECTED Final   Listeria monocytogenes NOT DETECTED NOT DETECTED Final   Staphylococcus species DETECTED (A) NOT DETECTED Final    Comment: Methicillin (oxacillin) resistant coagulase negative staphylococcus. Possible blood culture contaminant (unless isolated from more than one blood culture draw or clinical case suggests pathogenicity). No antibiotic treatment is indicated for blood  culture contaminants. CRITICAL RESULT CALLED TO, READ BACK BY AND VERIFIED WITH: PHARMD T DANG [id number] MLM    Staphylococcus aureus (BCID) NOT DETECTED NOT DETECTED Final   Methicillin resistance DETECTED  (A) NOT DETECTED Final    Comment: CRITICAL RESULT CALLED TO, READ BACK BY AND VERIFIED WITH: PHARMD T DANG [id number] MLM    Streptococcus species NOT DETECTED NOT DETECTED Final   Streptococcus agalactiae NOT DETECTED NOT DETECTED Final   Streptococcus pneumoniae NOT DETECTED NOT DETECTED Final   Streptococcus pyogenes NOT DETECTED NOT DETECTED Final   Acinetobacter baumannii NOT DETECTED NOT DETECTED Final   Enterobacteriaceae species NOT DETECTED NOT DETECTED Final   Enterobacter cloacae complex NOT DETECTED NOT DETECTED Final   Escherichia coli NOT DETECTED NOT DETECTED  Final   Klebsiella oxytoca NOT DETECTED NOT DETECTED Final   Klebsiella pneumoniae NOT DETECTED NOT DETECTED Final   Proteus species NOT DETECTED NOT DETECTED Final   Serratia marcescens NOT DETECTED NOT DETECTED Final   Haemophilus influenzae NOT DETECTED NOT DETECTED Final   Neisseria meningitidis NOT DETECTED NOT DETECTED Final   Pseudomonas aeruginosa NOT DETECTED NOT DETECTED Final   Candida albicans NOT DETECTED NOT DETECTED Final   Candida glabrata NOT DETECTED NOT DETECTED Final   Candida krusei NOT DETECTED NOT DETECTED Final   Candida parapsilosis NOT DETECTED NOT DETECTED Final   Candida tropicalis NOT DETECTED NOT DETECTED Final    Comment: Performed at [facility], [place] [address], [place], [place] [address]  Blood culture (routine x 2)     Status: None   Collection Time: 03/06/19  6:59 PM  Result Value Ref Range Status   Specimen Description BLOOD LEFT HAND  Final   Special Requests   Final    BOTTLES DRAWN AEROBIC AND ANAEROBIC Blood Culture results may not be optimal due to an inadequate volume of blood received in culture bottles   Culture   Final    NO GROWTH 5 DAYS Performed at [facility], [place] [address], [place], [place] [address]    Report Status 03/11/2019 FINAL  Final  Culture, blood (routine x 2)     Status: None   Collection Time: 03/07/19  6:42 PM  Result Value Ref  Range Status   Specimen Description BLOOD RIGHT HAND  Final   Special Requests   Final    BOTTLES DRAWN AEROBIC AND ANAEROBIC Blood Culture adequate volume   Culture   Final    NO GROWTH 6 DAYS Performed at [facility], [place] [address] [place], [place] [address]    Report Status 03/13/2019 FINAL  Final  Culture, blood (routine x 2)     Status: None   Collection Time: 03/07/19  6:42 PM  Result Value Ref Range Status   Specimen Description BLOOD LEFT HAND  Final   Special Requests   Final    BOTTLES DRAWN AEROBIC ONLY Blood Culture results may not be optimal due to an inadequate volume of blood received in culture bottles   Culture   Final    NO GROWTH 6 DAYS Performed at [facility], [place] [address] [place], [place] [address]    Report Status 03/13/2019 FINAL  Final         Radiology Studies: No results found.      Scheduled Meds: . acetaZOLAMIDE  250 mg Oral BID  . acetylcysteine  2 mL Nebulization BID  . arformoterol  15 mcg Nebulization BID  . bisoprolol  2.5 mg Oral Daily  . budesonide (PULMICORT) nebulizer solution  0.5 mg Nebulization BID  . docusate sodium  100 mg Oral Daily  . doxycycline  100 mg Oral Q12H  . ferrous sulfate  325 mg Oral BID WC  . folic acid  1 mg Oral Daily  . gabapentin  600 mg Oral BID  . guaiFENesin  400 mg Oral BID  . insulin aspart  0-20 Units Subcutaneous TID WC  . insulin aspart  0-5 Units Subcutaneous QHS  . insulin aspart  3 Units Subcutaneous TID WC  . insulin NPH Human  25 Units Subcutaneous BID AC & HS  . ipratropium-albuterol  3 mL Nebulization TID  . isosorbide-hydrALAZINE  1 tablet Oral TID  . latanoprost  1 drop Both  Eyes QHS  . mouth rinse  15 mL Mouth Rinse BID  . nicotine  7 mg Transdermal Daily  . pantoprazole  40 mg Oral BID  . polyethylene glycol  17 g Oral Daily  . potassium chloride  40 mEq Oral TID  . [START ON 03/17/2019] predniSONE  20 mg Oral Q breakfast   Followed by  . [START ON 03/19/2019]  predniSONE  10 mg Oral Q breakfast  . rosuvastatin  20 mg Oral q1800  . sodium chloride flush  10-40 mL Intracatheter Q12H  . sodium chloride flush  3 mL Intravenous Q12H  . spironolactone  25 mg Oral Daily   Continuous Infusions: . sodium chloride    . furosemide (LASIX) infusion 10 mg/hr (03/15/19 1234)  . heparin    . milrinone 0.25 mcg/kg/min (03/16/19 0916)     LOS: 10 days    Time spent: 39 minutes     J [place] DO [facility] Pager [phone]  If 7PM-7AM, please contact night-coverage [email or web address] Password [facility] 03/16/2019, 1:29 PM

## 2019-03-16 NOTE — Progress Notes (Addendum)
Inpatient Diabetes Program Recommendations  AACE/ADA: New Consensus Statement on Inpatient Glycemic Control  Target Ranges:  Prepandial:   less than 140 mg/dL      Peak postprandial:   less than 180 mg/dL (1-2 hours)      Critically ill patients:  140 - 180 mg/dL   Results for BRODIE, CORRELL (MRN [id number]) as of 03/16/2019 08:40  Ref. Range 03/15/2019 06:44 03/15/2019 16:24 03/15/2019 16:28 03/15/2019 21:00 03/16/2019 06:30  Glucose-Capillary Latest Ref Range: 70 - 99 mg/dL 188 (H) 522 (HH) 507 (HH) 277 (H) 202 (H)   Review of Glycemic Control  Current orders for Inpatient glycemic control: NPH 25 units BID, Novolog 0-20 units TID with meals, Novolog 0-5 units QHS, Novolog 3 units TID with meals; Prednisone 30 mg QAM  Inpatient Diabetes Program Recommendations:   Insulin-Basal: Noted patient did NOT receive morning dose of NPH on 03/15/19. As result, glucose up to 522 mg/dl on 03/15/19. Would not recommend changing NPH dose at this time. Will follow trends.  Thanks, Barnie Alderman, RN, MSN, CDE Diabetes Coordinator Inpatient Diabetes Program [phone] (Team Pager from 8am to 5pm)

## 2019-03-16 NOTE — Progress Notes (Signed)
Patient is eating lunch now and unable to perform flutter at this time. Patient states he has been doing it on his own and will perform the flutter after lunch.

## 2019-03-16 NOTE — Progress Notes (Signed)
Patient's CBG was 562 this evening. Schorr updated and advised to give the patient 8 units of novolog.  Patient received novolog and will recheck CBG at 0100. Will continue to monitor patient closely.

## 2019-03-16 NOTE — Progress Notes (Signed)
Patient is able to perform flutter valve on his own and reassures me that he's performing the flutter frequently.

## 2019-03-17 LAB — CBC
HCT: 26.5 % — ABNORMAL LOW (ref 39.0–52.0)
Hemoglobin: 7.8 g/dL — ABNORMAL LOW (ref 13.0–17.0)
MCH: 25.2 pg — ABNORMAL LOW (ref 26.0–34.0)
MCHC: 29.4 g/dL — ABNORMAL LOW (ref 30.0–36.0)
MCV: 85.8 fL (ref 80.0–100.0)
Platelets: 281 10*3/uL (ref 150–400)
RBC: 3.09 MIL/uL — ABNORMAL LOW (ref 4.22–5.81)
RDW: 24.1 % — ABNORMAL HIGH (ref 11.5–15.5)
WBC: 10.4 10*3/uL (ref 4.0–10.5)
nRBC: 0.3 % — ABNORMAL HIGH (ref 0.0–0.2)

## 2019-03-17 LAB — HEPARIN LEVEL (UNFRACTIONATED): Heparin Unfractionated: 0.25 IU/mL — ABNORMAL LOW (ref 0.30–0.70)

## 2019-03-17 LAB — GLUCOSE, CAPILLARY
Glucose-Capillary: 522 mg/dL (ref 70–99)
Glucose-Capillary: 351 mg/dL — ABNORMAL HIGH (ref 70–99)
Glucose-Capillary: 208 mg/dL — ABNORMAL HIGH (ref 70–99)
Glucose-Capillary: 283 mg/dL — ABNORMAL HIGH (ref 70–99)
Glucose-Capillary: 250 mg/dL — ABNORMAL HIGH (ref 70–99)
Glucose-Capillary: 248 mg/dL — ABNORMAL HIGH (ref 70–99)
Glucose-Capillary: 250 mg/dL — ABNORMAL HIGH (ref 70–99)

## 2019-03-17 LAB — BASIC METABOLIC PANEL
Anion gap: 12 (ref 5–15)
BUN: 75 mg/dL — ABNORMAL HIGH (ref 8–23)
CO2: 40 mmol/L — ABNORMAL HIGH (ref 22–32)
Calcium: 8.5 mg/dL — ABNORMAL LOW (ref 8.9–10.3)
Chloride: 85 mmol/L — ABNORMAL LOW (ref 98–111)
Creatinine, Ser: 2.06 mg/dL — ABNORMAL HIGH (ref 0.61–1.24)
GFR calc Af Amer: 37 mL/min — ABNORMAL LOW (ref >60)
GFR calc non Af Amer: 32 mL/min — ABNORMAL LOW (ref >60)
Glucose, Bld: 205 mg/dL — ABNORMAL HIGH (ref 70–99)
Potassium: 3.2 mmol/L — ABNORMAL LOW (ref 3.5–5.1)
Sodium: 137 mmol/L (ref 135–145)

## 2019-03-17 LAB — MAGNESIUM: Magnesium: 2 mg/dL (ref 1.7–2.4)

## 2019-03-17 MED ORDER — HYDROCORTISONE 1 % EX CREA
1.0000 "application " | TOPICAL_CREAM | Freq: Three times a day (TID) | CUTANEOUS | Status: DC | PRN
  Administered 2019-03-30: 1 via TOPICAL
  Filled 2019-03-17: qty 28

## 2019-03-17 MED ORDER — INSULIN NPH (HUMAN) (ISOPHANE) 100 UNIT/ML ~~LOC~~ SUSP
35.0000 [IU] | Freq: Two times a day (BID) | SUBCUTANEOUS | Status: DC
  Administered 2019-03-17 – 2019-03-19 (×4): 35 [IU] via SUBCUTANEOUS

## 2019-03-17 MED ORDER — APIXABAN 5 MG PO TABS
5.0000 mg | ORAL_TABLET | Freq: Two times a day (BID) | ORAL | Status: DC
  Administered 2019-03-17 – 2019-03-31 (×29): 5 mg via ORAL
  Filled 2019-03-17 (×29): qty 1

## 2019-03-17 MED ORDER — INSULIN ASPART 100 UNIT/ML ~~LOC~~ SOLN
12.0000 [IU] | Freq: Three times a day (TID) | SUBCUTANEOUS | Status: DC
  Administered 2019-03-17 – 2019-03-18 (×4): 12 [IU] via SUBCUTANEOUS

## 2019-03-17 MED ORDER — METOLAZONE 5 MG PO TABS
5.0000 mg | ORAL_TABLET | Freq: Once | ORAL | Status: AC
  Administered 2019-03-17: 5 mg via ORAL
  Filled 2019-03-17: qty 1

## 2019-03-17 MED ORDER — INSULIN ASPART 100 UNIT/ML ~~LOC~~ SOLN
5.0000 [IU] | Freq: Once | SUBCUTANEOUS | Status: AC
  Administered 2019-03-17: 5 [IU] via SUBCUTANEOUS

## 2019-03-17 MED ORDER — DIAZEPAM 5 MG PO TABS
10.0000 mg | ORAL_TABLET | Freq: Every day | ORAL | Status: DC | PRN
  Administered 2019-03-17 – 2019-03-30 (×10): 10 mg via ORAL
  Filled 2019-03-17 (×11): qty 2

## 2019-03-17 MED ORDER — POTASSIUM CHLORIDE CRYS ER 20 MEQ PO TBCR
60.0000 meq | EXTENDED_RELEASE_TABLET | Freq: Three times a day (TID) | ORAL | Status: DC
  Administered 2019-03-17 (×3): 60 meq via ORAL
  Filled 2019-03-17 (×3): qty 3

## 2019-03-17 NOTE — Progress Notes (Signed)
Patient performs CPT on his own with the flutter.

## 2019-03-17 NOTE — TOC Benefit Eligibility Note (Signed)
Transition of Care [facility]) Benefit Eligibility Note    Patient Details  Name: Elizardo Chilson MRN: [id number] Date of Birth: 1945/11/22      Covered?: No(NO PHARMACY  BENEFITS WITH [employer])        Spoke with Person/Company/Phone Number:: KATHY([employer] RX # [phone])           Additional Notes: I HAVE CALLED [facility] : PATIENT HAS A DISCOUNT CARD    Memory Argue Phone Number: 03/17/2019, 1:35 PM

## 2019-03-17 NOTE — Progress Notes (Signed)
PROGRESS NOTE    Nicholas Caldwell  JAS:[id number] DOB: [birth year] DOA: 03/06/2019 PCP: [facility]   Brief Narrative:   Nicholas Caldwell is a 73 y.o. M with CAD, isch CM EF 40%, sdCHF, pAF on Xarelto, pHTN, MO, OSA not compliant with CPAP, COPD on home O2 3L, and smoking who presented with swelling and dyspnea.  Patient had had slow progressive dyspnea, SOB with exertion, also significant peripheral edema.  In ER, BNP >1000, CXR with edema, HR 124 in Afib, and new anemia Hgb 6.7 g/dL. Transfused and started on Lasix.   Assessment & Plan:   Principal Problem:   Acute on chronic respiratory failure with hypoxia and hypercapnia (HCC) Active Problems:   Essential hypertension   Type 2 diabetes mellitus with neuropathy   PAF (paroxysmal atrial fibrillation) (HCC)   COPD (chronic obstructive pulmonary disease) (HCC)   Medically noncompliant   Atrial fibrillation with RVR (HCC)   AKI (acute kidney injury) ([place])   Acute on chronic anemia   Acute on chronic systolic (congestive) heart failure (HCC)   Acute on chronic systolic CHF (congestive heart failure) (HCC)   Iron deficiency anemia   NSVT (nonsustained ventricular tachycardia) (HCC)   Tobacco abuse counseling   Gastritis and gastroduodenitis   Acute on chronic hypoxic respiratory failure: resolved Patient presenting from home with progressive dyspnea associated with worsening lower extremity edema.  Likely multifactorial from acute CHF exacerbation and COPD flare, as well as anemia, NSVT/AF.  Chest x-ray on admission notable for bilateral interstitial and alveolar airspace opacities with cardiomegaly. --Now titrated down to home O2 at 3 L per nasal cannula --Continue supportive care  Acute on chronic systolic and diastolic CHF EF 41-93% on TEE in Mar.   --Cardiology following, appreciate assistance --Net negative 3.7L yesterday; net negative 20.8L since admission --Milrinone, Acetazolamide and Lasix gtt per Cardiology  --metolazone 45m today per cards --Continue spironolactone --Continue BiDIL --Continue Crestor --Continue BB --Cardiac, low-salt diet, fluid restrict to 1200 mL's per day  Atrial fibrillation/flutter Continues with persistent A. fib/flutter on telemetry.  Cardiology following, appreciate assistance.  Has been discussed with EP, not a optimal ablation candidate, may consider in the future. --Started on heparin drip on 03/15/2019; transition to Eliquis today, Hgb stable 7.8 --Cardiology plans TEE/DCCV on tomorrow with Dr. MAundra Dubin--continue beta-blocker --Continue to monitor on telemetry  Acute kidney injury Creatinine baseline 1.2 for the last 6 months, roughly, trending up from 0.9 last few years.  AKI again this hospitalization, has been increasing last few days with diuresis. --Cr 2.25-->2.10-->2.06 --Lasix drip rate decreased by cardiology, no at 135mhr, continues on acetazolamide and milrinone --Daily BMP  Iron deficiency anemia Suspected chronic GI blood loss from gastric ulcer.  Received 2 units PRBCs during the hospitalization. EGD 5/12 with multiple nonbleeding erosions, one nonbleeding superficial gastric ulcer with clean ulcer base --s/p Feraheme 5/4, 5/12, and repeat in one week --Continue protonix 4049mID x 12 weeks --Minimize NSAID use  Acute COPD exacerbation Patient with home O2 dependence at 3 L per nasal cannula. --Continue prednisone taper --Doxycycline 100 mg p.o. twice daily, plan 7-day course --Pulmicort/Brovana --Continue nebs --Chest PT, Mucinex, Mucomyst prn --Continue to titrate supplemental oxygen to maintain SPO2 greater than 88%  Positive blood culture CoNS in 1/2.  Likely contaminant.  Hypokalemia -K 3.2 today, potassium increased to 60 mg 3 times daily; hypokalemic exacerbated by Lasix drip --Repeat electrolytes with magnesium in the a.m.  Diabetes Hemoglobin A1c 6.5 on 11/13/2018.  Home regimen includes metformin 1000 mg  p.o. twice daily,  NPH 70-30 5u BID. Glucose elevated to 291 this AM, likely exacerbated by steroid use  --increase NPH from 30u to 35u SQ BID --increase Novolog from 8u to 12u [facility] --high dose Insulin sliding scale for further coverage --Hopeful glucose will improve as steroids being tapered off  Hypertension Coronary disease secondary prevention -Continue Crestor, BB  Paroxysmal atrial fibrillation NSVT CHA2DS2-Vasc 4 (Age, CHF, PVD, DM).   -Restart anticoagulation with heparin to monitor for bleeding, if none, can resume Eliquis or Xarelto by 5/14 per Cardiology -Continue BB  OHS/OSA/MO --Noncompliant with CPAP at home per report   DVT prophylaxis:  Heparin drip Code Status: FULL CODE Family Communication: none Disposition Plan: inpatient, plan TEE/DCCV on Friday   Consultants:   Cardiology  Procedures:   EGD 03/15/2019  Antimicrobials:   Doxycycline   Subjective: Patient seen and examined at bedside, continues with mild dyspnea, although improving but not back to his normal baseline.  No other complaints at this time.  Denies headache, no chest pain, no palpitations, no nausea/vomiting/diarrhea, no fever/chills/night sweats, no abdominal pain.  No acute events overnight per nursing staff.  Objective: Vitals:   03/17/19 0854 03/17/19 0859 03/17/19 0905 03/17/19 1117  BP: 110/63   (!) 96/45  Pulse: 99   95  Resp: 20   19  Temp:    98.4 F (36.9 C)  TempSrc:    Oral  SpO2: 95% 94% 99% 92%  Weight:      Height:        Intake/Output Summary (Last 24 hours) at 03/17/2019 1220 Last data filed at 03/17/2019 1118 Gross per 24 hour  Intake 1818.96 ml  Output 3325 ml  Net -1506.04 ml   Filed Weights   03/15/19 0439 03/16/19 0346 03/17/19 0356  Weight: 124.2 kg 125.8 kg 125.3 kg    Examination:  General exam: Appears calm and comfortable  Respiratory system: Breath sounds slightly decreased bilateral bases with crackles, normal respiratory effort on 3 L nasal cannula  Cardiovascular system: S1 & S2 heard, irregularly irregular rhythm, normal rate. No JVD, murmurs, rubs, gallops or clicks.  2+ pedal edema bilaterally Gastrointestinal system: Abdomen is nondistended, soft and nontender. No organomegaly or masses felt. Normal bowel sounds heard. Central nervous system: Alert and oriented. No focal neurological deficits. Extremities: Symmetric 5 x 5 power. Skin: No rashes, lesions or ulcers Psychiatry: Judgement and insight appear normal. Mood & affect appropriate.     Data Reviewed: I have personally reviewed following labs and imaging studies  CBC: Recent Labs  Lab 03/11/19 0630 03/12/19 0217 03/13/19 0416 03/15/19 0416 03/16/19 0430 03/17/19 0520  WBC 9.2 10.5 7.1 11.6* 9.9 10.4  NEUTROABS 8.5* 8.3* 6.1  --   --   --   HGB 8.3* 8.4* 7.9* 7.9* 8.1* 7.8*  HCT 27.8* 28.4* 26.5* 26.3* 27.7* 26.5*  MCV 84.5 83.3 84.7 85.1 84.5 85.8  PLT 489* 447* 379 308 308 562   Basic Metabolic Panel: Recent Labs  Lab 03/11/19 0630  03/14/19 0506 03/14/19 0734 03/14/19 1619 03/15/19 0416 03/16/19 0430 03/17/19 0520  NA 140   < > 136  --  130* 132* 134* 137  K 2.6*   < > 2.8*  --  3.6 3.3* 3.4* 3.2*  CL 82*   < > 84*  --  75* 82* 83* 85*  CO2 41*   < > 34*  --  37* 37* 40* 40*  GLUCOSE 293*   < > 282*  --  211* 258*  291* 205*  BUN 49*   < > 64*  --  69* 75* 75* 75*  CREATININE 1.73*   < > 1.92*  --  2.09* 2.25* 2.10* 2.06*  CALCIUM 8.7*   < > 7.9*  --  8.6* 8.5* 8.5* 8.5*  MG 2.2  --   --  1.9  --   --   --  2.0   < > = values in this interval not displayed.   GFR: Estimated Creatinine Clearance: 45.6 mL/min (A) (by C-G formula based on SCr of 2.06 mg/dL (H)). Liver Function Tests: No results for input(s): AST, ALT, ALKPHOS, BILITOT, PROT, ALBUMIN in the last 168 hours. No results for input(s): LIPASE, AMYLASE in the last 168 hours. No results for input(s): AMMONIA in the last 168 hours. Coagulation Profile: No results for input(s): INR, PROTIME  in the last 168 hours. Cardiac Enzymes: No results for input(s): CKTOTAL, CKMB, CKMBINDEX, TROPONINI in the last 168 hours. BNP (last 3 results) No results for input(s): PROBNP in the last 8760 hours. HbA1C: No results for input(s): HGBA1C in the last 72 hours. CBG: Recent Labs  Lab 03/16/19 1625 03/16/19 2155 03/17/19 0105 03/17/19 0602 03/17/19 1115  GLUCAP 255* 562* 351* 208* 283*   Lipid Profile: No results for input(s): CHOL, HDL, LDLCALC, TRIG, CHOLHDL, LDLDIRECT in the last 72 hours. Thyroid Function Tests: No results for input(s): TSH, T4TOTAL, FREET4, T3FREE, THYROIDAB in the last 72 hours. Anemia Panel: No results for input(s): VITAMINB12, FOLATE, FERRITIN, TIBC, IRON, RETICCTPCT in the last 72 hours. Sepsis Labs: No results for input(s): PROCALCITON, LATICACIDVEN in the last 168 hours.  Recent Results (from the past 240 hour(s))  Culture, blood (routine x 2)     Status: None   Collection Time: 03/07/19  6:42 PM  Result Value Ref Range Status   Specimen Description BLOOD RIGHT HAND  Final   Special Requests   Final    BOTTLES DRAWN AEROBIC AND ANAEROBIC Blood Culture adequate volume   Culture   Final    NO GROWTH 6 DAYS Performed at [facility], [address], [place], [place] [address]    Report Status 03/13/2019 FINAL  Final  Culture, blood (routine x 2)     Status: None   Collection Time: 03/07/19  6:42 PM  Result Value Ref Range Status   Specimen Description BLOOD LEFT HAND  Final   Special Requests   Final    BOTTLES DRAWN AEROBIC ONLY Blood Culture results may not be optimal due to an inadequate volume of blood received in culture bottles   Culture   Final    NO GROWTH 6 DAYS Performed at [facility], [place] [address] [place], [place] [address]    Report Status 03/13/2019 FINAL  Final         Radiology Studies: No results found.      Scheduled Meds: . acetaZOLAMIDE  250 mg Oral BID  . acetylcysteine  2 mL Nebulization BID   . apixaban  5 mg Oral BID  . arformoterol  15 mcg Nebulization BID  . bisoprolol  2.5 mg Oral Daily  . budesonide (PULMICORT) nebulizer solution  0.5 mg Nebulization BID  . docusate sodium  100 mg Oral Daily  . doxycycline  100 mg Oral Q12H  . ferrous sulfate  325 mg Oral BID WC  . folic acid  1 mg Oral Daily  . gabapentin  600 mg Oral BID  . guaiFENesin  400 mg Oral BID  .  insulin aspart  0-20 Units Subcutaneous TID WC  . insulin aspart  0-5 Units Subcutaneous QHS  . insulin aspart  12 Units Subcutaneous TID WC  . insulin NPH Human  35 Units Subcutaneous BID AC & HS  . ipratropium-albuterol  3 mL Nebulization TID  . isosorbide-hydrALAZINE  1 tablet Oral TID  . latanoprost  1 drop Both Eyes QHS  . mouth rinse  15 mL Mouth Rinse BID  . nicotine  7 mg Transdermal Daily  . pantoprazole  40 mg Oral BID  . polyethylene glycol  17 g Oral Daily  . potassium chloride  60 mEq Oral TID  . predniSONE  20 mg Oral Q breakfast   Followed by  . [START ON 03/19/2019] predniSONE  10 mg Oral Q breakfast  . rosuvastatin  20 mg Oral q1800  . sodium chloride flush  10-40 mL Intracatheter Q12H  . sodium chloride flush  3 mL Intravenous Q12H  . spironolactone  25 mg Oral Daily   Continuous Infusions: . sodium chloride    . furosemide (LASIX) infusion 10 mg/hr (03/16/19 1631)  . milrinone 0.25 mcg/kg/min (03/17/19 0841)     LOS: 11 days    Time spent: 38 minutes    Strider Vallance J [place] DO [facility] Pager [phone]  If 7PM-7AM, please contact night-coverage [email or web address] Password [email or web address] 03/17/2019, 12:20 PM

## 2019-03-17 NOTE — Progress Notes (Signed)
[place] for heparin to [facility] Indication: atrial fibrillation  No Known Allergies  Patient Measurements: Height: 6' (182.9 cm) Weight: 276 lb 3.8 oz (125.3 kg) IBW/kg (Calculated) : 77.6 Heparin Dosing Weight: 105 kg  Vital Signs: Temp: 98.5 F (36.9 C) (05/14 0742) Temp Source: Oral (05/14 0742) BP: 110/63 (05/14 0742) Pulse Rate: 84 (05/14 0742)  Labs: Recent Labs    03/15/19 0416 03/16/19 0430 03/16/19 2145 03/17/19 0520  HGB 7.9* 8.1*  --  7.8*  HCT 26.3* 27.7*  --  26.5*  PLT 308 308  --  281  HEPARINUNFRC  --   --  0.21* 0.25*  CREATININE 2.25* 2.10*  --  2.06*    Estimated Creatinine Clearance: 45.6 mL/min (A) (by C-G formula based on SCr of 2.06 mg/dL (H)).   Medical History: Past Medical History:  Diagnosis Date  . Atrial flutter ([place])    a. recurrent AFlutter with RVR;  b. Amiodarone Rx started 4/16  . CAD (coronary artery disease)    a. LHC 1/16:  mLAD diffuse disease, pLCx mild disease, dLCx with disease but too small for PCI, RCA ok, EF 25-30%  . Chronic pain   . Chronic systolic CHF (congestive heart failure) ([place])   . COPD (chronic obstructive pulmonary disease) ([place])   . Diabetes mellitus without complication ([place])   . Hypercholesteremia   . Hypertension   . NICM (nonischemic cardiomyopathy) ([place])    a.dx 2016. b. 2D echo 06/2016 - Last echo 07/01/16: mod dilated LV, mod LVH, EF 25-30%, mild-mod MR, sev LAE, mild-mod reduced RV systolic function, mild-mod TR, PASP 7mHG.  .Marland KitchenPAF (paroxysmal atrial fibrillation) (HCC)    On amio - ot a candidate for flecainide due to cardiomyopathy, not a candidate for Tikosyn due to prolonged QT, and felt to be a poor candidate for ablation given left atrial size.  . Pulmonary hypertension ([place]   . Tobacco abuse     Medications:  Medications Prior to Admission  Medication Sig Dispense Refill Last Dose  . albuterol (VENTOLIN HFA) 108 (90 Base) MCG/ACT inhaler Inhale 2  puffs into the lungs every 6 (six) hours as needed for wheezing or shortness of breath.   Past Week at Unknown time  . bisoprolol (ZEBETA) 5 MG tablet Take 0.5 tablets (2.5 mg total) by mouth daily for 30 days. 15 tablet 3 03/06/2019 at 0800  . budesonide-formoterol (SYMBICORT) 160-4.5 MCG/ACT inhaler Inhale 2 puffs into the lungs 2 (two) times daily.   Past Week at Unknown time  . diazepam (VALIUM) 5 MG tablet Take 5 mg by mouth daily as needed for anxiety.   03/06/2019 at am  . fluticasone (FLONASE) 50 MCG/ACT nasal spray Place 2 sprays into both nostrils daily as needed for allergies.    Past Week at Unknown time  . gabapentin (NEURONTIN) 600 MG tablet Take 1 tablet (600 mg total) by mouth 2 (two) times daily. 60 tablet 0 03/06/2019 at am  . guaifenesin (HUMIBID E) 400 MG TABS tablet Take 1 tablet (400 mg total) by mouth every 6 (six) hours as needed. (Patient taking differently: Take 400 mg by mouth every 6 (six) hours as needed (cough). ) 10 tablet 0 Past Week at Unknown time  . hydrocortisone (ANUSOL-HC) 2.5 % rectal cream Place 1 application rectally 2 (two) times daily as needed for hemorrhoids or itching.    Past Week at Unknown time  . hydrocortisone 1 % lotion Apply 1 application topically See admin instructions. Apply small  amount to back twice daily for itchy rash   Past Week at Unknown time  . insulin aspart protamine - aspart (NOVOLOG MIX 70/30 FLEXPEN) (70-30) 100 UNIT/ML FlexPen Inject 0.05 mLs (5 Units total) into the skin 2 (two) times daily. (Patient taking differently: Inject 30 Units into the skin 2 (two) times daily. ) 15 mL 11 03/06/2019 at am  . ipratropium-albuterol (DUONEB) 0.5-2.5 (3) MG/3ML SOLN Take 3 mLs by nebulization 2 (two) times daily as needed (shortness of breath/wheezing).    03/05/2019 at Unknown time  . isosorbide-hydrALAZINE (BIDIL) 20-37.5 MG tablet Take 1 tablet by mouth 3 (three) times daily. (Patient taking differently: Take 1 tablet by mouth 2 (two) times a day. ) 90  tablet 3 03/06/2019 at am  . latanoprost (XALATAN) 0.005 % ophthalmic solution Place 1 drop into both eyes at bedtime.   Past Week at Unknown time  . magnesium oxide (MAG-OX) 400 MG tablet Take 400 mg by mouth daily.   03/06/2019 at am  . metFORMIN (GLUCOPHAGE-XR) 500 MG 24 hr tablet Take 1,000 mg by mouth 2 (two) times daily with a meal.   03/06/2019 at am  . metolazone (ZAROXOLYN) 2.5 MG tablet Take 1 tablet (2.5 mg total) by mouth every Wednesday. Take extra Potassium 7mq (40 total, daily) when taking this medication 10 tablet 3 2 weeks ago  . oxyCODONE-acetaminophen (PERCOCET) 10-325 MG tablet Take 1 tablet by mouth every 6 (six) hours.   03/06/2019 at 1700  . OXYGEN Inhale 3 L into the lungs continuous.    03/06/2019 at Unknown time  . pantoprazole (PROTONIX) 40 MG tablet Take 1 tablet (40 mg total) by mouth 2 (two) times daily. 60 tablet 0 Past Week at Unknown time  . polyethylene glycol (MIRALAX / GLYCOLAX) packet Take 17 g by mouth daily. (Patient taking differently: Take 17 g by mouth daily as needed (constipation). ) 14 each 0 Past Month at Unknown time  . potassium chloride SA (K-DUR) 20 MEQ tablet Take 40 mEq by mouth See admin instructions. Take 2 tablets (40 meq) by mouth every morning, take an additional tablet (20 meq) at night if taking metolazone that day.   03/06/2019 at am  . rivaroxaban (XARELTO) 20 MG TABS tablet Take 1 tablet (20 mg total) by mouth daily with supper. (Patient taking differently: Take 20 mg by mouth at bedtime. ) 30 tablet 4 03/05/2019 at 2300  . rosuvastatin (CRESTOR) 40 MG tablet Take 20 mg by mouth daily.   03/06/2019 at am  . sodium chloride (OCEAN) 0.65 % SOLN nasal spray Place 2 sprays into both nostrils as needed for congestion.    Past Month at Unknown time  . torsemide (DEMADEX) 20 MG tablet Take 4 tablets (80 mg total) by mouth 2 (two) times daily for 30 days. 240 tablet 0 03/06/2019 at afternoon  . acetaminophen (TYLENOL) 160 MG/5ML solution Take 20.3 mLs (650 mg  total) by mouth every 6 (six) hours as needed for mild pain, headache or fever. (Patient not taking: Reported on 03/06/2019) 120 mL 0 Not Taking at Unknown time  . diclofenac sodium (VOLTAREN) 1 % GEL Apply 2 g topically 4 (four) times daily. (Patient not taking: Reported on 03/07/2019) 1 Tube 1 Not Taking at Unknown time  . Magnesium Oxide 420 (252 Mg) MG TABS Take 420 mg by mouth 2 (two) times daily. (Patient not taking: Reported on 03/06/2019) 60 tablet 0 Not Taking at Unknown time  . Multiple Vitamin (MULTIVITAMIN WITH MINERALS) TABS tablet Take 1  tablet by mouth daily. (Patient not taking: Reported on 03/06/2019)   Not Taking at Unknown time  . potassium chloride SA (K-DUR,KLOR-CON) 20 MEQ tablet Take 2 tablets (40 mEq total) by mouth every morning AND 1 tablet (20 mEq total) at bedtime. (Patient not taking: Reported on 03/06/2019) 90 tablet 0 Not Taking at Unknown time  . rosuvastatin (CRESTOR) 20 MG tablet Take 1 tablet (20 mg total) by mouth daily. (Patient not taking: Reported on 03/06/2019) 30 tablet 0 Not Taking at Unknown time    Assessment: 73 y/o male admitted 03/06/2019 with SOB from acute on chronic systolic CHF; he was also noted to have anemia with Hgb 6.7 (baseline ~8). He was on Xarelto PTA for Afib which has been on hold - noncompliance noted. He had EGD today and biopsy taken.  Heparin level came back slightly subtherapeutic at 0.25, on 1100 units/hr. Hgb 7.8, plt 281. No s/sx of bleeding. No infusion issues. Plan to switch to apixaban this morning. Given age<80, wt>60 kg but Scr>1.5, qualifies for full dose.   Goal of Therapy:  Monitor platelets by anticoagulation protocol: Yes   Plan:  Discontinue heparin infusion Start apixaban 5 mg twice daily Monitor renal fx, CBC, and for s/sx of bleeding    Antonietta Jewel, PharmD, [place] Clinical Pharmacist  Pager: [phone] Phone: [phone] 03/17/2019 7:49 AM  **Pharmacist phone directory can now be found on [email or web address] listed under [place]**

## 2019-03-17 NOTE — Progress Notes (Addendum)
Patient ID: Nicholas Caldwell, male   DOB: Nov 26, 1945, 73 y.o.   MRN: [id number]     Advanced Heart Failure Rounding Note  PCP-Cardiologist: No primary care provider on file.   Subjective:    Milrinone started 5/10 at 0.25.   Remains on lasix gtt at 10 and milrinone. CVP climbing again. Now up to 18.Diuresing well with 4.6L out but weight stable. Creatinine 2.10-> 2.06   Remains in AFL. On low dose heparin. Hgb 7.9 -> 8.1 -> 7.8 No obvious bleeding  Feels ok. Denies SOB, orthopnea or PND. Belly feels tight.   Objective:   Weight Range: 125.3 kg Body mass index is 37.46 kg/m.   Vital Signs:   Temp:  [97.4 F (36.3 C)-98.5 F (36.9 C)] 98.5 F (36.9 C) (05/14 0742) Pulse Rate:  [83-110] 99 (05/14 0854) Resp:  [17-27] 20 (05/14 0854) BP: (102-126)/(52-63) 110/63 (05/14 0854) SpO2:  [94 %-99 %] 99 % (05/14 0905) FiO2 (%):  [32 %] 32 % (05/14 0905) Weight:  [125.3 kg] 125.3 kg (05/14 0356) Last BM Date: 03/16/19  Weight change: Filed Weights   03/15/19 0439 03/16/19 0346 03/17/19 0356  Weight: 124.2 kg 125.8 kg 125.3 kg    Intake/Output:   Intake/Output Summary (Last 24 hours) at 03/17/2019 1013 Last data filed at 03/17/2019 0400 Gross per 24 hour  Intake 1518.96 ml  Output 2275 ml  Net -756.04 ml      Physical Exam    General:  Obese male sitting in recliner . No resp difficulty HEENT: normal Neck: supple. no JVP to ear. Carotids 2+ bilat; no bruits. No lymphadenopathy or thryomegaly appreciated. Cor: PMI nondisplaced. Irregular rate & rhythm. 2/6 TR Lungs: clear Abdomen: obese soft, nontender, + distended. No hepatosplenomegaly. No bruits or masses. Good bowel sounds. Extremities: no cyanosis, clubbing, rash, 2+ edema + UNNA boots  Neuro: alert & orientedx3, cranial nerves grossly intact. moves all 4 extremities w/o difficulty. Affect pleasant    Telemetry   Atypical atrial flutter rate in 80s-100s (personally reviewed).   Labs    CBC Recent Labs   03/16/19 0430 03/17/19 0520  WBC 9.9 10.4  HGB 8.1* 7.8*  HCT 27.7* 26.5*  MCV 84.5 85.8  PLT 308 644   Basic Metabolic Panel Recent Labs    03/16/19 0430 03/17/19 0520  NA 134* 137  K 3.4* 3.2*  CL 83* 85*  CO2 40* 40*  GLUCOSE 291* 205*  BUN 75* 75*  CREATININE 2.10* 2.06*  CALCIUM 8.5* 8.5*  MG  --  2.0   Liver Function Tests No results for input(s): AST, ALT, ALKPHOS, BILITOT, PROT, ALBUMIN in the last 72 hours. No results for input(s): LIPASE, AMYLASE in the last 72 hours. Cardiac Enzymes No results for input(s): CKTOTAL, CKMB, CKMBINDEX, TROPONINI in the last 72 hours.  BNP: BNP (last 3 results) Recent Labs    12/01/18 0015 12/30/18 1621 03/06/19 1829  BNP 1,666.7* 1,273.0* 1,020.1*    ProBNP (last 3 results) No results for input(s): PROBNP in the last 8760 hours.   D-Dimer No results for input(s): DDIMER in the last 72 hours. Hemoglobin A1C No results for input(s): HGBA1C in the last 72 hours. Fasting Lipid Panel No results for input(s): CHOL, HDL, LDLCALC, TRIG, CHOLHDL, LDLDIRECT in the last 72 hours. Thyroid Function Tests No results for input(s): TSH, T4TOTAL, T3FREE, THYROIDAB in the last 72 hours.  Invalid input(s): FREET3  Other results:   Imaging    No results found.   Medications:  Scheduled Medications: . acetaZOLAMIDE  250 mg Oral BID  . acetylcysteine  2 mL Nebulization BID  . apixaban  5 mg Oral BID  . arformoterol  15 mcg Nebulization BID  . bisoprolol  2.5 mg Oral Daily  . budesonide (PULMICORT) nebulizer solution  0.5 mg Nebulization BID  . docusate sodium  100 mg Oral Daily  . doxycycline  100 mg Oral Q12H  . ferrous sulfate  325 mg Oral BID WC  . folic acid  1 mg Oral Daily  . gabapentin  600 mg Oral BID  . guaiFENesin  400 mg Oral BID  . insulin aspart  0-20 Units Subcutaneous TID WC  . insulin aspart  0-5 Units Subcutaneous QHS  . insulin aspart  12 Units Subcutaneous TID WC  . insulin NPH Human  35 Units  Subcutaneous BID AC & HS  . ipratropium-albuterol  3 mL Nebulization TID  . isosorbide-hydrALAZINE  1 tablet Oral TID  . latanoprost  1 drop Both Eyes QHS  . mouth rinse  15 mL Mouth Rinse BID  . nicotine  7 mg Transdermal Daily  . pantoprazole  40 mg Oral BID  . polyethylene glycol  17 g Oral Daily  . potassium chloride  60 mEq Oral TID  . predniSONE  20 mg Oral Q breakfast   Followed by  . [START ON 03/19/2019] predniSONE  10 mg Oral Q breakfast  . rosuvastatin  20 mg Oral q1800  . sodium chloride flush  10-40 mL Intracatheter Q12H  . sodium chloride flush  3 mL Intravenous Q12H  . spironolactone  25 mg Oral Daily    Infusions: . sodium chloride    . furosemide (LASIX) infusion 10 mg/hr (03/16/19 1631)  . milrinone 0.25 mcg/kg/min (03/17/19 0841)    PRN Medications: sodium chloride, acetaminophen, diazepam, fluticasone, ondansetron (ZOFRAN) IV, oxyCODONE-acetaminophen, sodium chloride flush, sodium chloride flush   Assessment/Plan   1. Acute on chronic systolic CHF: Nonischemic cardiomyopathy.  With prominent RV failure, likely related to COPD/OSA/OHS. TEE in 3/20 with EF 40-45%, severely dilated RV with moderately decreased systolic function. He has had multiple recent admissions with CHF and COPD exacerbations.  He has not cooperated with paramedicine or [facility] home health as outpatient, has not been using CPAP per his wife, suspect significant medication noncompliance as well though he denies.   - Milrinone started 5/9. Now on 0.25 co-ox pending but has been in the 70% range - He remains on lasix drip with marked volume overload. CVP 21-> 18 -> 15 -> 16 -> 18 - Lasix cut back to 10 mg/hr due to AKI. Diuresing very well on lasix gtt and diamox but weight not changing. I again discussed with him the need to limit fluid intake. Will add metolazone 62m today. Watch renal function. Supp K. On recent RHC CVP was down to 10 - Continue UNNA boots - Continue bisoprolol 2.5 daily.  -  Continue Bidil 1 tab tid.  - Continue spironolactone 25 mg daily.    - Cardiomems may be helpful as outpatient.  2. Anemia: Baseline hgb around 8.  6.7 at admission, denies overt GI bleeding.  He has had 2 unit PRBCs and IV Fe.  Hgb 7.9 today, lower but no overt bleeding.  Colonoscopy 5/8 unremarkable but EGD showed necrotic mucosa gastric fundus/body that is concerning for gastric cancer, biopsies taken.  - Hgb relatively stable at 8.3-> 7.9. -> 8.1-> 7.8 - Gastric biopsy negative for malignancy. Repeat endoscopy 5/12 showed inflammatory and friable gastric mucosa  and a non bleeding ulcer.   - Tolerating low-dose heparin/. Will transition to Eliquis today - IV iron per primary team and GI 3. Atrial fibrillation/flutter: Patient has history of paroxysmal atrial fibrillation.  This admission, he appears to be in an atypical atrial flutter.  Rate is currently controlled. Xarelto on hold with GI bleeding.  Was seen in past by Dr. Rayann Heman, not thought to be good ablation candidate.  He has not been on Tikosyn or amiodarone due to history of long QT.  - Would like to do TEE-guided DCCV - As above, will switch to Eliquis today. Plan TEE/DC-CV in am with [name] if hgb stable.  - Will eventually ask EP to reconsider ablation given lack of good anti-arrhythmic options. He seems to do better in NSR.  4. COPD: Severe, on 3L home oxygen.  He was wheezing initially but now much improved.  This may be due to pulmonary edema, but also consider component of COPD exacerbation.  - Getting Duonebs.  - He is now on prednisone and doxycycline 100 mg bid.   - Per primary team  5. OHS/OSA: On home oxygen.  Per wife, has not been using CPAP at home.  Will give CPAP at night here.  6. ID: Suspect this is primarily a CHF exacerbation.  Afebrile and WBCs not significantly elevated.  1 blood culture positive for what appears to be coagulase negative Staph (probably contaminant).  COVID-19 negative.  7. CAD: Nonobstructive  on prior cath.  No s/s ischemia. He is on Crestor. Off ASA with DOAC and GI bleed  8. Acute on chronic kidney failure III - Baseline seems to be 1.3-1.6 - Creatinine back down today. 1.9-> 2.2 -> 2.1 -> 2.0. Plan as above 9. Hypokalemia - will supp  Length of Stay: [place], MD  03/17/2019, 10:13 AM  Advanced Heart Failure Team Pager [phone] (M-F; [place])  Please contact [facility] for night-coverage after hours (4p -7a ) and weekends on [email or web address]

## 2019-03-17 NOTE — TOC Progression Note (Signed)
Transition of Care [facility]) - Progression Note    Patient Details  Name: Nicholas Caldwell MRN: [id number] Date of Birth: Apr 18, 1946  Transition of [facility]) CM/SW Contact  Graves-Bigelow, Ocie Cornfield, RN Phone Number: 03/17/2019, 4:11 PM  Clinical Narrative:  Benefits check reflecting no part D coverage. Pt [facility]MD is Dr [name] and he gets his medications filled at the [facility]. Local pharmacy for new medications pt uses [facility] on [address]. CM will continue to monitor for additional transition of care needs.  Expected Discharge Plan: [place] Barriers to Discharge: No Barriers Identified  Expected Discharge Plan and Services Expected Discharge Plan: [place] In-house Referral: NA Discharge Planning Services: CM Consult Post Acute Care Choice: NA Living arrangements for the past 2 months: Single Family Home                 DME Arranged: N/A DME Agency: NA       HH Arranged: RN, Disease Management, PT [place] Agency: [place] ([place]) Date HH Agency Contacted: 03/07/19 Time [place]: 1058 Representative spoke with at [place]: Hydrographic surveyor   Social Determinants of Health ([place]) Interventions    Readmission Risk Interventions No flowsheet data found.

## 2019-03-17 NOTE — Progress Notes (Signed)
Physical Therapy Treatment Patient Details Name: Nicholas Caldwell MRN: [id number] DOB: September 20, 1946 Today's Date: 03/17/2019    History of Present Illness Pt is a 73 y.o. male admitted 03/06/19 with acute on chronic heart failure and respiratory failure. Found to have severe iron deficiency anemia; now s/p small bowel enteroscopy and biopsy 5/12. Plan for DCCV 5/15. PMH includes CHF, chronic anemia, HLD, obesity, CAD, NICM, polyneuropathy, DM, COPD.   PT Comments    Pt progressing with mobility. Ambulatory with RW at supervision-level, requiring multiple seated rest breaks due to fatigue and DOE. SpO2 down to 86% on 4L O2 [place]; required 6L O2 to maintain SpO2 >90% with ambulation. Pt hopeful for d/c home this weekend. Owns necessary DME and has assist from spouse. Continue to recommend HHPT services.    Follow Up Recommendations  Home health PT;Supervision/Assistance - 24 hour     Equipment Recommendations  None recommended by PT    Recommendations for Other Services       Precautions / Restrictions Precautions Precautions: Fall Precaution Comments: On 3L O2 baseline, up to 4-6L with mobility Restrictions Weight Bearing Restrictions: No    Mobility  Bed Mobility Overal bed mobility: Needs Assistance Bed Mobility: Supine to Sit;Sit to Supine     Supine to sit: Modified independent (Device/Increase time);HOB elevated Sit to supine: Mod assist   General bed mobility comments: Insistent HOB and middle of bed be elevated despite educ; mod indep with use of bed rails. Required seated rest once at EOB due to fatigue/SOB. ModA to assist BLEs to supine  Transfers Overall transfer level: Needs assistance Equipment used: Rolling walker (2 wheeled) Transfers: Sit to/from Stand Sit to Stand: Supervision         General transfer comment: Heavy reliance on BUE support to push into standing  Ambulation/Gait Ambulation/Gait assistance: Supervision;Min guard Gait Distance (Feet): 106  Feet Assistive device: Rolling walker (2 wheeled) Gait Pattern/deviations: Step-through pattern;Decreased stride length;Trunk flexed Gait velocity: Decreased Gait velocity interpretation: <1.8 ft/sec, indicate of risk for recurrent falls General Gait Details: Amb 52' + 64' with prolonged seated rest break in between due to fatigue and DOE 3/4; SpO2 down to 86% on 4L O2 [place], maintaining >90% on 6L O2 [place]. Slow, labored gait with RW and supervison. Amb a few feet in room without DME and min guard for balance   Stairs             Wheelchair Mobility    Modified Rankin (Stroke Patients Only)       Balance Overall balance assessment: Needs assistance Sitting-balance support: No upper extremity supported Sitting balance-Leahy Scale: Fair     Standing balance support: Bilateral upper extremity supported;During functional activity Standing balance-Leahy Scale: Fair Standing balance comment: Can static stand and take steps without UE support; min guard                            Cognition Arousal/Alertness: Awake/alert Behavior During Therapy: WFL for tasks assessed/performed Overall Cognitive Status: Within Functional Limits for tasks assessed                                 General Comments: WFL for simple tasks      Exercises      General Comments        Pertinent Vitals/Pain Pain Assessment: Faces Faces Pain Scale: Hurts a little bit Pain Location: BLEs Pain Descriptors /  Indicators: Discomfort;Sore Pain Intervention(s): Limited activity within patient's tolerance    Home Living                      Prior Function            PT Goals (current goals can now be found in the care plan section) Acute Rehab PT Goals Patient Stated Goal: to go home PT Goal Formulation: With patient Time For Goal Achievement: 03/24/19 Potential to Achieve Goals: Good Progress towards PT goals: Progressing toward goals    Frequency    Min  3X/week      PT Plan Current plan remains appropriate    Co-evaluation              AM-PAC PT "6 Clicks" Mobility   Outcome Measure  Help needed turning from your back to your side while in a flat bed without using bedrails?: None Help needed moving from lying on your back to sitting on the side of a flat bed without using bedrails?: A Lot Help needed moving to and from a bed to a chair (including a wheelchair)?: None Help needed standing up from a chair using your arms (e.g., wheelchair or bedside chair)?: A Little Help needed to walk in hospital room?: A Little Help needed climbing 3-5 steps with a railing? : A Lot 6 Click Score: 18    End of Session Equipment Utilized During Treatment: Gait belt;Oxygen Activity Tolerance: Patient tolerated treatment well Patient left: in bed;with call bell/phone within reach;with bed alarm set;Other (comment)(with respiratory therapist present) Nurse Communication: Mobility status PT Visit Diagnosis: Unsteadiness on feet (R26.81);Muscle weakness (generalized) (M62.81)     Time: 5461-2432 PT Time Calculation (min) (ACUTE ONLY): 23 min  Charges:  $Gait Training: 23-37 mins                    Mabeline Caras, PT, DPT Acute Rehabilitation Services  Pager [phone] Office [place] 03/17/2019, 2:57 PM

## 2019-03-17 NOTE — Discharge Instructions (Addendum)
Chronic Respiratory Failure  Respiratory failure is a condition in which the lungs do not work well and the breathing (respiratory) system fails. When respiratory failure occurs, it becomes difficult for the lungs to get enough oxygen or to eliminate carbon dioxide or to do both duties. If the lungs do not work properly, the heart, brain, and other body systems do not get enough oxygen. Respiratory failure is life-threatening if it is not treated. Respiratory failure can be acute or chronic. Acute respiratory failure is sudden and severe and requires emergency medical treatment. Chronic respiratory failure happens over time, usually due to a medical condition that gets worse. What are the causes? This condition may be caused by any problem that affects the heart or lungs. Causes include:  Chronic bronchitis and emphysema (COPD).  Pulmonary fibrosis.  Water in the lungs due to heart failure, lung injury, or infection (pulmonary edema).  Asthma.  Nerve or muscle diseases that make chest movements difficult, such as [place] disease or Guillain-Barre syndrome.  A collapsed lung (pneumothorax).  Pulmonary hypertension.  Chronic sleep apnea.  Pneumonia.  Obesity.  A blood clot in a lung (pulmonary embolism).  Trauma to the chest that makes breathing difficult. What increases the risk? You are more likely to develop this condition if:  You are a smoker, or have a history of smoking.  You have a weak immune system.  You have a family history of breathing problems or lung disease.  You have a long term lung disease such as COPD. What are the signs or symptoms? Symptoms of this condition include:  Shortness of breath with or without activity.  Difficulty breathing.  Wheezing.  A fast or irregular heartbeat (arrhythmia).  Chest pain or tightness.  A bluish color to the fingernail or toenail beds (cyanosis).  Confusion.  Drowsiness.  Extreme fatigue, especially  with minimal activity. How is this diagnosed? This condition may be diagnosed based on:  Your medical history.  A physical exam.  Other tests, such as: ? A chest X-ray. ? A CT scan of your lungs. ? Blood tests, such as an arterial blood gas test. This test is done to check if you have enough oxygen in your blood. ? An electrocardiogram. This test records the electrical activity of your heart. ? An echocardiogram. This test uses sound waves to produce an image of your heart.  A check of your blood pressure, heart rate, breathing rate, and blood oxygen level. How is this treated? Treatment for this condition depends on the cause. Treatment can include the following:  Getting oxygen through a nasal cannula. This is a tube that goes in your nose.  Getting oxygen through a face mask.  Receiving noninvasive positive pressure ventilation. This is a method of breathing support in which a machine blows air into your lungs through a mask. The machine allows you to breathe on your own. It helps the body take in oxygen and eliminate carbon dioxide.  Using a ventilator. This is a breathing machine that delivers oxygen to the lungs through a breathing tube that is put into the trachea. This machine is used when you can no longer breathe well enough on your own.  Medicines to help with breathing, such as: ? Medicines that open up and relax air passages, such as bronchodilators. These may be given through a device that turns liquid medicines into a mist you can breathe in (nebulizer). These medicines help with breathing. ? Diuretics. These medicines get rid of extra fluid  out of your lungs, which can help you breathe better. ? Steroid medicines. These decrease inflammation in the lungs. ? Antibiotic medicines. These may be given to treat a bacterial infection, such as pneumonia.  Pulmonary rehabilitation. This is an exercise program that strengthens the muscles in your chest and helps you learn  breathing techniques in order to manage your condition. Follow these instructions at home: Medicines  Take over-the-counter and prescription medicines only as told by your health care provider.  If you were prescribed an antibiotic medicine, take it as told by your health care provider. Do not stop taking the antibiotic even if you start to feel better. General instructions  Use oxygen therapy and pulmonary rehabilitation if directed to by your health care provider. If you require home oxygen therapy, ask your health care provider whether you should purchase a pulse oximeter to measure your oxygen level at home.  Work with your health care provider to create a plan to help you deal with your condition. Follow this plan.  Do not use any products that contain nicotine or tobacco, such as cigarettes and e-cigarettes. If you need help quitting, ask your health care provider.  Avoid exposure to irritants that make your breathing problems worse. These include smoke, chemicals, and fumes.  Stay active, but balance activity with periods of rest. Exercise and physical activity will help you maintain your ability to do things you want to do.  Stay up to date on all vaccines, especially yearly influenza and pneumonia vaccines.  Avoid people who are sick as well as crowded places during the flu season.  Keep all follow-up visits as told by your health care provider. This is important. Contact a health care provider if:  Your shortness of breath gets worse and you cannot do the things you used to do.  You have increased mucus (sputum), wheezing, coughing, or loss of energy.  You are on oxygen therapy and you are starting to need more.  You need to use your medicines more often.  You have a fever. Get help right away if:  Your shortness of breath becomes worse.  You are unable to say more than a few words without having to catch your breath.  You develop chest pain or  tightness. Summary  Respiratory failure is a condition in which the lungs do not work well and the breathing system fails.  This condition can be very serious and is often life-threatening.  This condition is diagnosed with tests and can be treated with medicines or oxygen.  Contact a health care provider if your shortness of breath gets worse or if you need to use your oxygen or medicines more often than before. This information is not intended to replace advice given to you by your health care provider. Make sure you discuss any questions you have with your health care provider. Document Released: 10/20/2005 Document Revised: 10/31/2016 Document Reviewed: 10/31/2016 Elsevier Interactive Patient Education  2019 [employer]   Edema  Edema is when you have too much fluid in your body or under your skin. Edema may make your legs, feet, and ankles swell up. Swelling is also common in looser tissues, like around your eyes. This is a common condition. It gets more common as you get older. There are many possible causes of edema. Eating too much salt (sodium) and being on your feet or sitting for a long time can cause edema in your legs, feet, and ankles. Hot weather may make edema worse. Edema is  usually painless. Your skin may look swollen or shiny. Follow these instructions at home:  Keep the swollen body part raised (elevated) above the level of your heart when you are sitting or lying down.  Do not sit still or stand for a long time.  Do not wear tight clothes. Do not wear garters on your upper legs.  Exercise your legs. This can help the swelling go down.  Wear elastic bandages or support stockings as told by your doctor.  Eat a low-salt (low-sodium) diet to reduce fluid as told by your doctor.  Depending on the cause of your swelling, you may need to limit how much fluid you drink (fluid restriction).  Take over-the-counter and prescription medicines only as told by your  doctor. Contact a doctor if:  Treatment is not working.  You have heart, liver, or kidney disease and have symptoms of edema.  You have sudden and unexplained weight gain. Get help right away if:  You have shortness of breath or chest pain.  You cannot breathe when you lie down.  You have pain, redness, or warmth in the swollen areas.  You have heart, liver, or kidney disease and get edema all of a sudden.  You have a fever and your symptoms get worse all of a sudden. Summary  Edema is when you have too much fluid in your body or under your skin.  Edema may make your legs, feet, and ankles swell up. Swelling is also common in looser tissues, like around your eyes.  Raise (elevate) the swollen body part above the level of your heart when you are sitting or lying down.  Follow your doctor's instructions about diet and how much fluid you can drink (fluid restriction). This information is not intended to replace advice given to you by your health care provider. Make sure you discuss any questions you have with your health care provider. Document Released: 04/07/2008 Document Revised: 11/07/2016 Document Reviewed: 11/07/2016 Elsevier Interactive Patient Education  2019 [place].   Heart Failure  Heart failure means your heart has trouble pumping blood. This makes it hard for your body to work well. Heart failure is usually a long-term (chronic) condition. You must take good care of yourself and follow your treatment plan from your doctor. Follow these instructions at home: Medicines  Take over-the-counter and prescription medicines only as told by your doctor. ? Do not stop taking your medicine unless your doctor told you to do that. ? Do not skip any doses. ? Refill your prescriptions before you run out of medicine. You need your medicines every day. Eating and drinking   Eat heart-healthy foods. Talk with a diet and nutrition specialist (dietitian) to make an eating  plan.  Choose foods that: ? Have no trans fat. ? Are low in saturated fat and cholesterol.  Choose healthy foods, like: ? Fresh or frozen fruits and vegetables. ? Fish. ? Low-fat (lean) meats. ? Legumes (like beans, peas, and lentils). ? Fat-free or low-fat dairy products. ? Whole-grain foods. ? High-fiber foods.  Limit salt (sodium) if told by your doctor. Ask your nutrition specialist to recommend heart-healthy seasonings.  Cook in healthy ways instead of frying. Healthy ways of cooking include: ? Roasting. ? Grilling. ? Broiling. ? Baking. ? Poaching. ? Steaming. ? Stir-frying.  Limit how much fluid you drink, if told by your doctor. Lifestyle  Do not smoke or use chewing tobacco. Do not use nicotine gum or patches before talking to your doctor.  Limit  alcohol intake to no more than 1 drink a day for non-pregnant women and 2 drinks a day for men. One drink equals 12 oz of beer, 5 oz of wine, or 1 oz of hard liquor. ? Tell your doctor if you drink alcohol many times a week. ? Talk with your doctor about whether any alcohol is safe for you. ? You should stop drinking alcohol: ? If your heart has been damaged by alcohol. ? You have very bad heart failure.  Do not use illegal drugs.  Lose weight if told by your doctor.  Do moderate physical activity if told by your doctor. Ask your doctor what activities are safe for you if: ? You are of older age (elderly). ? You have very bad heart failure. Keep track of important information  Weigh yourself every day. ? Weigh yourself every morning after you pee (urinate) and before breakfast. ? Wear the same amount of clothing each time. ? Write down your daily weight. Give your record to your doctor.  Check and write down your blood pressure as told by your doctor.  Check your pulse as told by your doctor. Dealing with heat and cold  If the weather is very hot: ? Avoid activity that takes a lot of energy. ? Use air  conditioning or fans, or find a cooler place. ? Avoid caffeine. ? Avoid alcohol. ? Wear clothing that is loose-fitting, lightweight, and light-colored.  If the weather is very cold: ? Avoid activity that takes a lot of energy. ? Layer your clothes. ? Wear mittens or gloves, a hat, and a scarf when you go outside. ? Avoid alcohol. General instructions  Manage other conditions that you have as told by your doctor.  Learn to manage stress. If you need help, ask your doctor.  Plan rest periods for when you get tired.  Get education and support as needed.  Get rehab (rehabilitation) to help you stay independent and to help with everyday tasks.  Stay up to date with shots (immunizations), especially pneumococcal and flu (influenza) shots.  Keep all follow-up visits as told by your doctor. This is important. Contact a doctor if:  You gain weight quickly.  You are more short of breath than normal.  You cannot do your normal activities.  You tire easily.  You cough more than normal, especially with activity.  You have any or more puffiness (swelling) in areas such as your hands, feet, ankles, or belly (abdomen).  You cannot sleep because it is hard to breathe.  You feel like your heart is beating fast (palpitations).  You get dizzy or light-headed when you stand up. Get help right away if:  You have trouble breathing.  You or someone else notices a change in your awareness. This could be trouble staying awake or trouble concentrating.  You have chest pain or discomfort.  You pass out (faint). Summary  Heart failure means your heart has trouble pumping blood.  Make sure you refill your prescriptions before you run out of medicine. You need your medicines every day.  Keep records of your weight and blood pressure to give to your doctor.  Contact a doctor if you gain weight quickly. This information is not intended to replace advice given to you by your health care  provider. Make sure you discuss any questions you have with your health care provider. Document Released: 07/29/2008 Document Revised: 07/14/2018 Document Reviewed: 11/11/2016 Elsevier Interactive Patient Education  2019 [employer].   Heart Failure Eating  Plan Heart failure, also called congestive heart failure, occurs when your heart does not pump blood well enough to meet your body's needs for oxygen-rich blood. Heart failure is a long-term (chronic) condition. Living with heart failure can be challenging. However, following your health care provider's instructions about a healthy lifestyle and working with a diet and nutrition specialist (dietitian) to choose the right foods may help to improve your symptoms. What are tips for following this plan? General guidelines  Do not eat more than 2,300 mg of salt (sodium) a day. The amount of sodium that is recommended for you may be lower, depending on your condition.  Maintain a healthy body weight as directed. Ask your health care provider what a healthy weight is for you. ? Check your weight every day. ? Work with your health care provider and dietitian to make a plan that is right for you to lose weight or maintain your current weight.  Limit how much fluid you drink. Ask your health care provider or dietitian how much fluid you can have each day.  Limit or avoid alcohol as told by your health care provider or dietitian. Reading food labels  Check food labels for the amount of sodium per serving. Choose foods that have less than 140 mg (milligrams) of sodium in each serving.  Check food labels for the number of calories per serving. This is important if you need to limit your daily calorie intake to lose weight.  Check food labels for the serving size. If you eat more than one serving, you will be eating more sodium and calories than what is listed on the label.  Look for foods that are labeled as "sodium-free," "very low sodium," or  "low sodium." ? Foods labeled as "reduced sodium" or "lightly salted" may still have more sodium than what is recommended for you. Cooking  Avoid adding salt when cooking. Ask your health care provider or dietitian before using salt substitutes.  Season food with salt-free seasonings, spices, or herbs. Check the label of seasoning mixes to make sure they do not contain salt.  Cook with heart-healthy oils, such as olive, canola, soybean, or sunflower oil.  Do not fry foods. Cook foods using low-fat methods, such as baking, boiling, grilling, and broiling.  Limit unhealthy fats when cooking by: ? Removing the skin from poultry, such as chicken. ? Removing all visible fats from meats. ? Skimming the fat off from stews, soups, and gravies before serving them. Meal planning   Limit your intake of: ? Processed, canned, or pre-packaged foods. ? Foods that are high in trans fat, such as fried foods. ? Sweets, desserts, sugary drinks, and other foods with added sugar. ? Full-fat dairy products, such as whole milk.  Eat a balanced diet that includes: ? 4-5 servings of fruit each day and 4-5 servings of vegetables each day. At each meal, try to fill half of your plate with fruits and vegetables. ? Up to 6-8 servings of whole grains each day. ? Up to 2 servings of lean meat, poultry, or fish each day. One serving of meat is equal to 3 oz. This is about the same size as a deck of cards. ? 2 servings of low-fat dairy each day. ? Heart-healthy fats. Healthy fats called omega-3 fatty acids are found in foods such as flaxseed and cold-water fish like sardines, salmon, and mackerel.  Aim to eat 25-35 g (grams) of fiber a day. Foods that are high in fiber include apples, broccoli, carrots,  beans, peas, and whole grains.  Do not add salt or condiments that contain salt (such as soy sauce) to foods before eating.  When eating at a restaurant, ask that your food be prepared with less salt or no salt,  if possible.  Try to eat 2 or more vegetarian meals each week.  Eat more home-cooked food and eat less restaurant, buffet, and fast food. Recommended foods The items listed may not be a complete list. Talk with your dietitian about what dietary choices are best for you. Grains Bread with less than 80 mg of sodium per slice. Whole-wheat pasta, quinoa, and brown rice. Oats and oatmeal. Barley. [place]. Grits and cream of wheat. Whole-grain and whole-wheat cold cereal. Vegetables All fresh vegetables. Vegetables that are frozen without sauce or added salt. Low-sodium or sodium-free canned vegetables. Fruits All fresh, frozen, and canned fruits. Dried fruits, such as raisins, prunes, and cranberries. Meats and other protein foods Lean cuts of meat. Skinless chicken and [place]. Fish with high omega-3 fatty acids, such as salmon, sardines, and other cold-water fishes. Eggs. Dried beans, peas, and edamame. Unsalted nuts and nut butters. Dairy Low-fat or nonfat (skim) milk and dried milk. Rice milk, soy milk, and almond milk. Low-fat or nonfat yogurt. Small amounts of reduced-sodium block cheese. Low-sodium cottage cheese. Fats and oils Olive, canola, soybean, flaxseed, or sunflower oil. Avocado. Sweets and desserts Apple sauce. Granola bars. Sugar-free pudding and gelatin. Frozen fruit bars. Seasoning and other foods Fresh and dried herbs. Lemon or lime juice. Vinegar. Low-sodium ketchup. Salt-free marinades, salad dressings, sauces, and seasonings. Foods to avoid The items listed may not be a complete list. Talk with your dietitian about what dietary choices are best for you. Grains Bread with more than 80 mg of sodium per slice. Hot or cold cereal with more than 140 mg sodium per serving. Salted pretzels and crackers. Pre-packaged breadcrumbs. Bagels, croissants, and biscuits. Vegetables Canned vegetables. Frozen vegetables with sauce or seasonings. Creamed vegetables. [place] fries. Onion  rings. Pickled vegetables and sauerkraut. Fruits Fruits that are dried with sodium-containing preservatives. Meats and other protein foods Ribs and chicken wings. Bacon, ham, pepperoni, bologna, salami, and packaged luncheon meats. Hot dogs, bratwurst, and sausage. Canned meat. Smoked meat and fish. Salted nuts and seeds. Dairy Whole milk, half-and-half, and cream. Buttermilk. Processed cheese, cheese spreads, and cheese curds. Regular cottage cheese. Feta cheese. Shredded cheese. String cheese. Fats and oils Butter, lard, shortening, ghee, and bacon fat. Canned and packaged gravies. Seasoning and other foods Onion salt, garlic salt, table salt, and sea salt. Marinades. Regular salad dressings. Relishes, pickles, and olives. Meat flavorings and tenderizers, and bouillon cubes. Horseradish, ketchup, and mustard. Worcestershire sauce. Teriyaki sauce, soy sauce (including reduced sodium). Hot sauce and Tabasco sauce. Steak sauce, fish sauce, oyster sauce, and cocktail sauce. Taco seasonings. Barbecue sauce. Tartar sauce. Summary  A heart failure eating plan includes changes that limit your intake of sodium and unhealthy fat, and it may help you lose weight or maintain a healthy weight. Your health care provider may also recommend limiting how much fluid you drink.  Most people with heart failure should eat no more than 2,300 mg of salt (sodium) a day. The amount of sodium that is recommended for you may be lower, depending on your condition.  Contact your health care provider or dietitian before making any major changes to your diet. This information is not intended to replace advice given to you by your health care provider. Make sure you discuss any  questions you have with your health care provider. Document Released: 03/06/2017 Document Revised: 03/06/2017 Document Reviewed: 03/06/2017 Elsevier Interactive Patient Education  2019 [place].   Heart Failure Exacerbation  Heart failure is a  condition in which the heart does not fill up with enough blood, and therefore does not pump enough blood and oxygen to the body. When this happens, parts of the body do not get the blood and oxygen they need to function properly. This can cause symptoms such as breathing problems, fatigue, swelling, and confusion. Heart failure exacerbation refers to heart failure symptoms that get worse. The symptoms may get worse suddenly or develop slowly over time. Heart failure exacerbation is a serious medical problem that should be treated right away. What are the causes? A heart failure exacerbation can be triggered by:  Not taking your heart failure medicines correctly.  Infections.  Eating an unhealthy diet or a diet that is high in salt (sodium).  Drinking too much fluid.  Drinking alcohol.  Taking illegal drugs, such as cocaine or methamphetamine.  Not exercising. Other causes include:  Other heart conditions such as an irregular heartbeat (arrhythmia).  Anemia.  Other medical problems, such as kidney failure. Sometimes the cause of the exacerbation is not known. What are the signs or symptoms? When heart failure symptoms suddenly or slowly get worse, this may be a sign of heart failure exacerbation. Symptoms of heart failure include:  Breathing problems or shortness of breath.  Chronic coughing or wheezing.  Fatigue.  Nausea or lack of appetite.  Feeling light-headed.  Confusion or memory loss.  Increased heart rate or irregular heartbeat.  Buildup of fluid in the legs, ankles, feet, or abdomen.  Difficulty breathing when lying down. How is this diagnosed? This condition is diagnosed based on:  Your symptoms and medical history.  A physical exam. You may also have tests, including:  Electrocardiogram (ECG). This test measures the electrical activity of your heart.  Echocardiogram. This test uses sound waves to take a picture of your heart to see how well it  works.  Blood tests.  Imaging tests, such as: ? Chest X-ray. ? MRI. ? Ultrasound.  Stress test. This test examines how well your heart functions when you exercise. Your heart is monitored while you exercise on a treadmill or exercise bike. If you cannot exercise, medicines may be used to increase your heartbeat in place of exercise.  Cardiac catheterization. During this test, a thin, flexible tube (catheter) is inserted into a blood vessel and threaded up to your heart. This test allows your health care provider to check the arteries that lead to your heart (coronary arteries).  Right heart catheterization. During this test, the pressure in your heart is measured. How is this treated? This condition may be treated by:  Adjusting your heart medicines.  Maintaining a healthy lifestyle. This includes: ? Eating a heart-healthy diet that is low in sodium. ? Not using any products that contain nicotine or tobacco, such as cigarettes and e-cigarettes. ? Regular exercise. ? Monitoring your fluid intake. ? Monitoring your weight and reporting changes to your health care provider.  Treating sleep apnea, if you have this condition.  Surgery. This may include: ? Implanting a device that helps both sides of your heart contract at the same time (cardiac resynchronization therapy device). This can help with heart function and relieve heart failure symptoms. ? Implanting a device that can correct heart rhythm problems (implantable cardioverter defibrillator). ? Connecting a device to your heart  to help it pump blood (ventricular assist device). ? Heart transplant. Follow these instructions at home: Medicines  Take over-the-counter and prescription medicines only as told by your health care provider.  Do not stop taking your medicines or change the amount you take. If you are having problems or side effects from your medicines, talk to your health care provider.  If you are having difficulty  paying for your medicines, contact a social worker or your clinic. There are many programs to assist with medicine costs.  Talk to your health care provider before starting any new medicines or supplements.  Make sure your health care provider and pharmacist have a list of all the medicines you are taking. Eating and drinking   Avoid drinking alcohol.  Eat a heart-healthy diet as told by your health care provider. This includes: ? Plenty of fruits and vegetables. ? Lean proteins. ? Low-fat dairy. ? Whole grains. ? Foods that are low in sodium. Activity   Exercise regularly as told by your health care provider. Balance exercise with rest.  Ask your health care provider what activities are safe for you. This includes sexual activity, exercise, and daily tasks at home or work. Lifestyle  Do not use any products that contain nicotine or tobacco, such as cigarettes and e-cigarettes. If you need help quitting, ask your health care provider.  Maintain a healthy weight. Ask your health care provider what weight is healthy for you.  Consider joining a patient support group. This can help with emotional problems you may have, such as stress and anxiety. General instructions  Talk to your health care provider about flu and pneumonia vaccines.  Keep a list of medicines that you are taking. This may help in emergency situations.  Keep all follow-up visits as told by your health care provider. This is important. Contact a health care provider if:  You have questions about your medicines or you miss a dose.  You feel anxious, depressed, or stressed.  You have swelling in your feet, ankles, legs, or abdomen.  You have shortness of breath during activity or exercise.  You have a cough.  You have a fever.  You have trouble sleeping.  You gain 2-3 lb (1-1.4 kg) in 24 hours or 5 lb (2.3 kg) in a week. Get help right away if:  You have chest pain.  You have shortness of breath  while resting.  You have severe fatigue.  You are confused.  You have severe dizziness.  You have a rapid or irregular heartbeat.  You have nausea or you vomit.  You have a cough that is worse at night or you cannot lie flat.  You have a cough that will not go away.  You have severe depression or sadness. Summary  When heart failure symptoms get worse, it is called heart failure exacerbation.  Common causes of this condition include taking medicines incorrectly, infections, and drinking alcohol.  This condition may be treated by adjusting medicines, maintaining a healthy lifestyle, or surgery.  Do not stop taking your medicines or change the amount you take. If you are having problems or side effects from your medicines, talk to your health care provider. This information is not intended to replace advice given to you by your health care provider. Make sure you discuss any questions you have with your health care provider. Document Released: 03/03/2017 Document Revised: 03/03/2017 Document Reviewed: 03/03/2017 Elsevier Interactive Patient Education  2019 [place].   Heart Failure Action Plan A heart  failure action plan helps you understand what to do when you have symptoms of heart failure. Follow the plan that was created by you and your health care provider. Review your plan each time you visit your health care provider. Red zone These signs and symptoms mean you should get medical help right away:  You have trouble breathing when resting.  You have a dry cough that is getting worse.  You have swelling or pain in your legs or abdomen that is getting worse.  You suddenly gain more than 2-3 lb (0.9-1.4 kg) in a day, or more than 5 lb (2.3 kg) in one week. This amount may be more or less depending on your condition.  You have trouble staying awake or you feel confused.  You have chest pain.  You do not have an appetite.  You pass out. If you experience any of  these symptoms:  Call your local emergency services (911 in the [place] right away or seek help at the emergency department of the nearest hospital. Yellow zone These signs and symptoms mean your condition may be getting worse and you should make some changes:  You have trouble breathing when you are active or you need to sleep with extra pillows.  You have swelling in your legs or abdomen.  You gain 2-3 lb (0.9-1.4 kg) in one day, or 5 lb (2.3 kg) in one week. This amount may be more or less depending on your condition.  You get tired easily.  You have trouble sleeping.  You have a dry cough. If you experience any of these symptoms:  Contact your health care provider within the next day.  Your health care provider may adjust your medicines. Green zone These signs mean you are doing well and can continue what you are doing:  You do not have shortness of breath.  You have very little swelling or no new swelling.  Your weight is stable (no gain or loss).  You have a normal activity level.  You do not have chest pain or any other new symptoms. Follow these instructions at home:  Take over-the-counter and prescription medicines only as told by your health care provider.  Weigh yourself daily. Your target weight is __________ lb (__________ kg). ? Call your health care provider if you gain more than __________ lb (__________ kg) in a day, or more than __________ lb (__________ kg) in one week.  Eat a heart-healthy diet. Work with a diet and nutrition specialist (dietitian) to create an eating plan that is best for you.  Keep all follow-up visits as told by your health care provider. This is important. Where to find more information  American Heart Association: www.heart.org Summary  Follow the action plan that was created by you and your health care provider.  Get help right away if you have any symptoms in the Red zone. This information is not intended to replace advice  given to you by your health care provider. Make sure you discuss any questions you have with your health care provider. Document Released: 11/29/2016 Document Revised: 06/24/2017 Document Reviewed: 11/29/2016 Elsevier Interactive Patient Education  2019 [employer].   Shortness of Breath, Adult Shortness of breath means you have trouble breathing. Shortness of breath could be a sign of a medical problem. Follow these instructions at home:   Watch for any changes in your symptoms.  Do not use any products that contain nicotine or tobacco, such as cigarettes, e-cigarettes, and chewing tobacco.  Do not smoke.  Smoking can cause shortness of breath. If you need help to quit smoking, ask your doctor.  Avoid things that can make it harder to breathe, such as: ? Mold. ? Dust. ? Air pollution. ? Chemical smells. ? Things that can cause allergy symptoms (allergens), if you have allergies.  Keep your living space clean. Use products that help remove mold and dust.  Rest as needed. Slowly return to your normal activities.  Take over-the-counter and prescription medicines only as told by your doctor. This includes oxygen therapy and inhaled medicines.  Keep all follow-up visits as told by your doctor. This is important. Contact a doctor if:  Your condition does not get better as soon as expected.  You have a hard time doing your normal activities, even after you rest.  You have new symptoms. Get help right away if:  Your shortness of breath gets worse.  You have trouble breathing when you are resting.  You feel light-headed or you pass out (faint).  You have a cough that is not helped by medicines.  You cough up blood.  You have pain with breathing.  You have pain in your chest, arms, shoulders, or belly (abdomen).  You have a fever.  You cannot walk up stairs.  You cannot exercise the way you normally do. These symptoms may represent a serious problem that is an  emergency. Do not wait to see if the symptoms will go away. Get medical help right away. Call your local emergency services (911 in the [place] Do not drive yourself to the hospital. Summary  Shortness of breath is when you have trouble breathing enough air. It can be a sign of a medical problem.  Avoid things that make it hard for you to breathe, such as smoking, pollution, mold, and dust.  Watch for any changes in your symptoms. Contact your doctor if you do not get better or you get worse. This information is not intended to replace advice given to you by your health care provider. Make sure you discuss any questions you have with your health care provider. Document Released: 04/07/2008 Document Revised: 03/22/2018 Document Reviewed: 03/22/2018 Elsevier Interactive Patient Education  2019 [employer]   Peripheral Edema  Peripheral edema is swelling that is caused by a buildup of fluid. Peripheral edema most often affects the lower legs, ankles, and feet. It can also develop in the arms, hands, and face. The area of the body that has peripheral edema will look swollen. It may also feel heavy or warm. Your clothes may start to feel tight. Pressing on the area may make a temporary dent in your skin. You may not be able to move your arm or leg as much as usual. There are many causes of peripheral edema. It can be a complication of other diseases, such as congestive heart failure, kidney disease, or a problem with your blood circulation. It also can be a side effect of certain medicines. It often happens to women during pregnancy. Sometimes, the cause is not known. Treating the underlying condition is often the only treatment for peripheral edema. Follow these instructions at home: Pay attention to any changes in your symptoms. Take these actions to help with your discomfort:  Raise (elevate) your legs while you are sitting or lying down.  Move around often to prevent stiffness and to lessen  swelling. Do not sit or stand for long periods of time.  Wear support stockings as told by your health care provider.  Follow instructions from  your health care provider about limiting salt (sodium) in your diet. Sometimes eating less salt can reduce swelling.  Take over-the-counter and prescription medicines only as told by your health care provider. Your health care provider may prescribe medicine to help your body get rid of excess water (diuretic).  Keep all follow-up visits as told by your health care provider. This is important. Contact a health care provider if:  You have a fever.  Your edema starts suddenly or is getting worse, especially if you are pregnant or have a medical condition.  You have swelling in only one leg.  You have increased swelling and pain in your legs. Get help right away if:  You develop shortness of breath, especially when you are lying down.  You have pain in your chest or abdomen.  You feel weak.  You faint. This information is not intended to replace advice given to you by your health care provider. Make sure you discuss any questions you have with your health care provider. Document Released: 11/27/2004 Document Revised: 03/24/2016 Document Reviewed: 05/02/2015 Elsevier Interactive Patient Education  2019 [place] With Heart Failure  Heart failure is a long-term (chronic) condition in which the heart cannot pump enough blood through the body. When this happens, parts of the body do not get the blood and oxygen they need. There is no cure for heart failure at this time, so it is important for you to take good care of yourself and follow the treatment plan set by your health care provider. If you are living with heart failure, there are ways to help you manage the disease. Follow these instructions at home: Living with heart failure requires you to make changes in your life. Your health care team will teach you about the changes you  need to make in order to relieve your symptoms and lower your risk of going to the hospital. Follow the treatment plan as set by your health care provider. Medicines Medicines are important in reducing your heart's workload, slowing the progression of heart failure, and improving your symptoms.  Take over-the-counter and prescription medicines only as told by your health care provider.  Do not stop taking your medicine unless your health care provider tells you to do that.  Do not skip any dose of your medicine.  Refill prescriptions before you run out of medicine. You need your medicines every day. Eating and drinking   Eat heart-healthy foods. Talk with a dietitian to make an eating plan that is right for you. ? If directed by your health care provider: ? Limit salt (sodium). Lowering your sodium intake may reduce symptoms of heart failure. Ask a dietitian to recommend heart-healthy seasonings. ? Limit your fluid intake. Fluid restriction may reduce symptoms of heart failure. ? Use low-fat cooking methods instead of frying. Low-fat methods include roasting, grilling, broiling, baking, poaching, steaming, and stir-frying. ? Choose foods that contain no trans fat and are low in saturated fat and cholesterol. Healthy choices include fresh or frozen fruits and vegetables, fish, lean meats, legumes, fat-free or low-fat dairy products, and whole-grain or high-fiber foods.  Limit alcohol intake to no more than 1 drink a day for nonpregnant women and 2 drinks a day for men. One drink equals 12 oz of beer, 5 oz of wine, or 1 oz of hard liquor. ? Drinking more than that is harmful to your heart. Tell your health care provider if you drink alcohol several times a week. ? Talk with  your health care provider about whether any level of alcohol use is safe for you. Activity   Ask your health care provider about attending cardiac rehabilitation. These programs include aerobic physical activity, which  provides many benefits for your heart.  If no cardiac rehabilitation program is available, ask your health care provider what aerobic exercises are safe for you to do. Lifestyle Make the lifestyle changes recommended by your health care provider. In general:  Lose weight if your health care provider tells you to do that. Weight loss may reduce symptoms of heart failure.  Do not use any products that contain nicotine or tobacco, such as cigarettes or e-cigarettes. If you need help quitting, ask your health care provider.  Do not use street (illegal) drugs.  Return to your normal activities as told by your health care provider. Ask your health care provider what activities are safe for you. General instructions   Make sure you weigh yourself every day to track your weight. Rapid weight gain may indicate an increase in fluid in your body and may increase the workload of your heart. ? Weigh yourself every morning. Do this after you urinate but before you eat breakfast. ? Wear the same type of clothing, without shoes, each time you weigh yourself. ? Weigh yourself on the same scale and in the same spot each time.  Living with chronic heart failure often leads to emotions such as fear, stress, anxiety, and depression. If you feel any of these emotions and need help coping, contact your health care provider. Other ways to get help include: ? Talking to friends and family members about your condition. They can give you support and guidance. Explain your symptoms to them and, if comfortable, invite them to attend appointments or rehabilitation with you. ? Joining a support group for people with chronic heart failure. Talking with other people who have the same symptoms may give you new ways of coping with your disease and your emotions.  Stay up to date with your shots (vaccines). Staying current on pneumococcal and influenza vaccines is especially important in preventing germs from attacking your  airways (respiratory infections).  Keep all follow-up visits as told by your health care provider. This is important. How to recognize changes in your condition You and your family members need to know what changes to watch for in your condition. Watch for the following changes and report them to your health care provider:  Sudden weight gain. Ask your health care provider what amount of weight gain to report.  Shortness of breath: ? Feeling short of breath while at rest, with no exercise or activity that required great effort. ? Feeling breathless with activity.  Swelling of your lower legs or ankles.  Difficulty sleeping: ? You wake up feeling short of breath. ? You have to use more pillows to raise your head in order to sleep.  Frequent, dry, hacking cough.  Loss of appetite.  Feeling more tired all the time.  Depression or feelings of sadness or hopelessness.  Bloating in the stomach. Where to find more information  Local support groups. Ask your health care provider about groups near you.  The American Heart Association: www.heart.org Contact a health care provider if:  You have a rapid weight gain.  You have increasing shortness of breath that is unusual for you.  You are unable to participate in your usual physical activities.  You tire easily.  You cough more than normal, especially with physical activity.  You have  any swelling or more swelling in areas such as your hands, feet, ankles, or abdomen.  You feel like your heart is beating quickly (palpitations).  You become dizzy or light-headed when you stand up. Get help right away if:  You have difficulty breathing.  You notice or your family notices a change in your awareness, such as having trouble staying awake or having difficulty with concentration.  You have pain or discomfort in your chest.  You have an episode of fainting (syncope). Summary  There is no cure for heart failure, so it is  important for you to take good care of yourself and follow the treatment plan set by your health care provider.  Medicines are important in reducing your heart's workload, slowing the progression of heart failure, and improving your symptoms.  Living with chronic heart failure often leads to emotions such as fear, stress, anxiety, and depression. If you are feeling any of these emotions and need help coping, contact your health care provider. This information is not intended to replace advice given to you by your health care provider. Make sure you discuss any questions you have with your health care provider. Document Released: 03/04/2017 Document Revised: 03/04/2017 Document Reviewed: 03/04/2017 Elsevier Interactive Patient Education  2019 [place] on my medicine - ELIQUIS (apixaban)  Why was Eliquis prescribed for you? Eliquis was prescribed for you to reduce the risk of a blood clot forming that can cause a stroke if you have a medical condition called atrial fibrillation (a type of irregular heartbeat).  What do You need to know about Eliquis ? Take your Eliquis TWICE DAILY - one tablet in the morning and one tablet in the evening with or without food. If you have difficulty swallowing the tablet whole please discuss with your pharmacist how to take the medication safely.  Take Eliquis exactly as prescribed by your doctor and DO NOT stop taking Eliquis without talking to the doctor who prescribed the medication.  Stopping may increase your risk of developing a stroke.  Refill your prescription before you run out.  After discharge, you should have regular check-up appointments with your healthcare provider that is prescribing your Eliquis.  In the future your dose may need to be changed if your kidney function or weight changes by a significant amount or as you get older.  What do you do if you miss a dose? If you miss a dose, take it as soon as you remember on the  same day and resume taking twice daily.  Do not take more than one dose of ELIQUIS at the same time to make up a missed dose.  Important Safety Information A possible side effect of Eliquis is bleeding. You should call your healthcare provider right away if you experience any of the following: ? Bleeding from an injury or your nose that does not stop. ? Unusual colored urine (red or dark brown) or unusual colored stools (red or black). ? Unusual bruising for unknown reasons. ? A serious fall or if you hit your head (even if there is no bleeding).  Some medicines may interact with Eliquis and might increase your risk of bleeding or clotting while on Eliquis. To help avoid this, consult your healthcare provider or pharmacist prior to using any new prescription or non-prescription medications, including herbals, vitamins, non-steroidal anti-inflammatory drugs (NSAIDs) and supplements.  This website has more information on Eliquis (apixaban): http://www.eliquis.com/eliquis/home

## 2019-03-17 NOTE — Care Management Important Message (Signed)
Important Message  Patient Details  Name: Nicholas Caldwell MRN: [id number] Date of Birth: 06/05/1946   Medicare Important Message Given:  Yes    Takiesha Mcdevitt Montine Circle 03/17/2019, 11:29 AM

## 2019-03-18 ENCOUNTER — Inpatient Hospital Stay (HOSPITAL_COMMUNITY): Payer: Medicare Other

## 2019-03-18 ENCOUNTER — Encounter: Payer: Self-pay | Admitting: Gastroenterology

## 2019-03-18 ENCOUNTER — Encounter (HOSPITAL_COMMUNITY): Payer: Self-pay | Admitting: Anesthesiology

## 2019-03-18 ENCOUNTER — Telehealth: Payer: Self-pay

## 2019-03-18 DIAGNOSIS — I639 Cerebral infarction, unspecified: Secondary | ICD-10-CM

## 2019-03-18 LAB — GLUCOSE, CAPILLARY
Glucose-Capillary: 204 mg/dL — ABNORMAL HIGH (ref 70–99)
Glucose-Capillary: 170 mg/dL — ABNORMAL HIGH (ref 70–99)
Glucose-Capillary: 267 mg/dL — ABNORMAL HIGH (ref 70–99)
Glucose-Capillary: 360 mg/dL — ABNORMAL HIGH (ref 70–99)
Glucose-Capillary: 145 mg/dL — ABNORMAL HIGH (ref 70–99)

## 2019-03-18 LAB — BASIC METABOLIC PANEL
Anion gap: 11 (ref 5–15)
BUN: 71 mg/dL — ABNORMAL HIGH (ref 8–23)
CO2: 35 mmol/L — ABNORMAL HIGH (ref 22–32)
Calcium: 8.1 mg/dL — ABNORMAL LOW (ref 8.9–10.3)
Chloride: 91 mmol/L — ABNORMAL LOW (ref 98–111)
Creatinine, Ser: 1.98 mg/dL — ABNORMAL HIGH (ref 0.61–1.24)
GFR calc Af Amer: 39 mL/min — ABNORMAL LOW (ref >60)
GFR calc non Af Amer: 33 mL/min — ABNORMAL LOW (ref >60)
Glucose, Bld: 129 mg/dL — ABNORMAL HIGH (ref 70–99)
Potassium: 3.2 mmol/L — ABNORMAL LOW (ref 3.5–5.1)
Sodium: 137 mmol/L (ref 135–145)

## 2019-03-18 LAB — CBC
HCT: 29.3 % — ABNORMAL LOW (ref 39.0–52.0)
Hemoglobin: 8.4 g/dL — ABNORMAL LOW (ref 13.0–17.0)
MCH: 25.1 pg — ABNORMAL LOW (ref 26.0–34.0)
MCHC: 28.7 g/dL — ABNORMAL LOW (ref 30.0–36.0)
MCV: 87.7 fL (ref 80.0–100.0)
Platelets: 310 10*3/uL (ref 150–400)
RBC: 3.34 MIL/uL — ABNORMAL LOW (ref 4.22–5.81)
RDW: 24 % — ABNORMAL HIGH (ref 11.5–15.5)
WBC: 10.5 10*3/uL (ref 4.0–10.5)
nRBC: 0.4 % — ABNORMAL HIGH (ref 0.0–0.2)

## 2019-03-18 LAB — MAGNESIUM: Magnesium: 2 mg/dL (ref 1.7–2.4)

## 2019-03-18 MED ORDER — METOLAZONE 5 MG PO TABS
5.0000 mg | ORAL_TABLET | Freq: Once | ORAL | Status: AC
  Administered 2019-03-18: 5 mg via ORAL
  Filled 2019-03-18: qty 1

## 2019-03-18 MED ORDER — POTASSIUM CHLORIDE CRYS ER 20 MEQ PO TBCR
60.0000 meq | EXTENDED_RELEASE_TABLET | Freq: Four times a day (QID) | ORAL | Status: DC
  Administered 2019-03-18 – 2019-03-20 (×9): 60 meq via ORAL
  Filled 2019-03-18 (×9): qty 3

## 2019-03-18 NOTE — Telephone Encounter (Signed)
Sent letter. Patient still  In hospital. Staff message to schedule future clinic visit with Dr. Bryan Lemma

## 2019-03-18 NOTE — Telephone Encounter (Signed)
Letter sent. Future clinic visit with Dr. Bryan Lemma will be scheduled when patient gets out of the hospital

## 2019-03-18 NOTE — Progress Notes (Signed)
Letter sent. Left message for patient to call back to set-up clinic visit with Dr. Bryan Lemma

## 2019-03-18 NOTE — Progress Notes (Signed)
Sliding scale and meal coverage insulin held this morning, because patient is NPO.  Patient still received 35 units of Novolin this morning.  Last CBG reading 170.

## 2019-03-18 NOTE — Significant Event (Signed)
Rapid Response Event Note  Overview: Code stroke called for neuro change.  Initial Focused Assessment: Upon arrival, pt was sitting up in the chair. Alert, answering questions. NIHSS 0. RN reports seizure like activity. Pt stated he has a history of seizures back in the 70s. He is not currently on medications for seizures.   Interventions: -Code stroke cancelled  Plan of Care (if not transferred): -Notify primary svc of events and further orders. -Call for further assistance if needed  Event Summary: Stayed in room  Call ended [place]  Madelynn Done

## 2019-03-18 NOTE — Progress Notes (Signed)
Received update and new orders from [place]. Neuro recommends patient to undergo EEG and MRI this morning.

## 2019-03-18 NOTE — Progress Notes (Signed)
Patient awoken to take vitals and get standing weight at approximately 0345. Patient alert and oriented at the time and able to follow commands. Patient stated that he was irritated that he had to get out of bed for standing weight. Patient still able to interact with staff. Upon standing up, patient found to have wet bed sheets. IV fluid connections and condom catheter were checked, but found no leak.  Patient placed in chair in order to change bed linens and gown.  While sitting in chair, patient became unresponsive for approximately 3-4 minutes. Patient would not respond to commands, answer any of the staff's questions, and would only respond to being rubbed on the chest. Patient had absent stare and slight tremors in extremities. Vital signs were WDL and blood glucose was checked and found to be within the 200's. Rapid response called at approximately 0400 and arrived at bedside 0405.  Patient assessed for possible stroke, but no deficits were found.  Patient unable to recollect incident, but insisted that he did not have a seizure and has not had a seizure since "1978" and is on medication for it. Patient states that he was "upset" with staff and wouldn't answer questions for that reason.  Schorr notified of the situation and assessed patient at bedside. Recommended a CT scan of the head.  Patient received CT scan and awaiting results.  Patient is resting comfortably in the bed and vital signs remain stable.  Will continue to monitor the patient frequently.

## 2019-03-18 NOTE — Progress Notes (Signed)
Brief GI progress note  Pathology results reviewed Diagnosis 1. Stomach, biopsy, body - GASTRIC OXYNTIC MUCOSA WITH RARE CRYSTALLINE IRON DEPOSITS, SUGGESTIVE OF IRON PILL GASTROPATHY - NEGATIVE FOR INTESTINAL METAPLASIA, DYSPLASIA OR MALIGNANCY 2. Stomach, biopsy, antrum - GASTRIC ANTRAL MUCOSA WITH FEW CRYSTALLINE IRON DEPOSITS, SUGGESTIVE OF IRON PILL GASTROPATHY - FOCAL INTESTINAL METAPLASIA, NEGATIVE FOR DYSPLASIA - WARTHIN-STARRY STAIN IS NEGATIVE FOR HELICOBACTER PYLORI  No evidence of malignant process occurring at this point in time. He does have some evidence of intestinal metaplasia of the antrum but no evidence of H. pylori infection. He will need follow-up with GI and consideration of gastric mapping in the future as an outpatient. We will work on setting that up with Dr. Bryan Lemma.  Justice Britain, MD [facility] Office # [phone]

## 2019-03-18 NOTE — Procedures (Signed)
History: 73 year old male with a remote history of seizures, staring spell last night  Sedation: None  Technique: This is a 21 channel routine scalp EEG performed at the bedside with bipolar and monopolar montages arranged in accordance to the international 10/20 system of electrode placement. One channel was dedicated to EKG recording.    Background: The background consists of intermixed alpha and beta activities. There is a well defined posterior dominant rhythm of 9 Hz that attenuates with eye opening.  With drowsiness there is anterior shifting of the PDR, but sleep is not recorded.  Photic stimulation: Physiologic driving is not performed  EEG Abnormalities: None  Clinical Interpretation: This normal EEG is recorded in the waking and drowsy state. There was no seizure or seizure predisposition recorded on this study. Please note that lack of epileptiform activity on EEG does not preclude the possibility of epilepsy.   Roland Rack, MD [facility] [phone]  If 7pm- 7am, please page neurology on call as listed in [place].

## 2019-03-18 NOTE — Progress Notes (Addendum)
Inpatient Diabetes Program Recommendations  AACE/ADA: New Consensus Statement on Inpatient Glycemic Control   Target Ranges:  Prepandial:   less than 140 mg/dL      Peak postprandial:   less than 180 mg/dL (1-2 hours)      Critically ill patients:  140 - 180 mg/dL   Results for ADEWALE, PUCILLO (MRN [id number]) as of 03/18/2019 06:34  Ref. Range 03/17/2019 06:02 03/17/2019 11:15 03/17/2019 16:20 03/17/2019 21:02 03/17/2019 22:35 03/18/2019 04:12 03/18/2019 06:25  Glucose-Capillary Latest Ref Range: 70 - 99 mg/dL 208 (H)  Novolog 15 units  NPH 35 units 283 (H)  Novolog 23 units 250 (H)  Novolog 19 units 248 (H)   250 (H)  Novolog 2 units  NPH 35 units 204 (H)   170 (H)   Review of Glycemic Control  Outpatient DM medications: 70/30 30 units BID, Metformin XR 1000 mg BID Current orders for Inpatient glycemic control: NPH 35 units BID, Novolog 12 units TID with meals, Novolog 0-20 units TID with meals, Novolog 0-5 units QHS; Prednisone 20 mg QAM   Inpatient Diabetes Program Recommendations:   Insulin - Meal Coverage: If steroids are continued, please consider increasing meal coverage to Novolog 16 units TID with meals.  Thanks, Barnie Alderman, RN, MSN, CDE Diabetes Coordinator Inpatient Diabetes Program [phone] (Team Pager from 8am to 5pm)

## 2019-03-18 NOTE — Progress Notes (Signed)
Placed pt on CPAP with 3L bled in and pressure of 10. Pt tolerating well RT to cont to monitor.

## 2019-03-18 NOTE — Progress Notes (Addendum)
Shift event note: Notified by Junie Panning, RN regarding pt having an approx 3-4 min period of unresponsiveness. Was awakened and assisted OOB to get a weight. At that time he was oriented and able to converse w/ RN and follow commands. Once assisted by to chair pt appeared to stare off into space, become a bit tremulous and would not respond to voice or follow commands. RR RN was paged and responded to bedside. At the time of RR RN's assessment pt appeared to be back to baseline. Pt told her he had not had a seizure sijce the 1970's though their is no documented h/o seizures and he is not currently on anti-seizure meds. This NP saw pt at bedside. Pt now noted to be up in chair, awake, alert and oriented to person place and DOB. He follows commands and currently exam is non focal.  Pt states he was "playing a joke" on the nurse because he was upset that he had been awakened to get a weight. He does not recall the RN asking him questions except he does recall her asking him what her name was. VS have remained stable throughout. He denies pain or other c/o. BBS w/ scattered expiratory wheezes w/o increased WOB. 02 sats are 99-100% on 4L [place].  Assessment/Plan: 1. Altered LOC vs seizure: Currently resolved. Etiology unclear. No focal deficits. Will obtain Ct head w/o CM. Consider neurology input if positive findings on CT. Continue to monitor closely on progressive unit.   Jeryl Columbia, NP-C [facility] Pager [phone]  Addendum: [phone]) Ct findings > Small bilateral cerebellar infarcts, probably chronic although that on the right is age indeterminate. Discussed pt w/ Dr Lorraine Lax w/ neurology service. He recommended EEG and MRI brain to further clarify. Neuro to see this am.

## 2019-03-18 NOTE — Consult Note (Addendum)
NEUROLOGY CONSULT  Reason for Consult: neurologic opinion of 3-4 min period of unresponsiveness Referring Physician: Dr [place]  CC: "Im hungry"; denies any other complaints at this time  HPI: Nicholas Caldwell is an 73 y.o. male with PMH of Afib on Xarelto, COPD, HFpEF, DM2, HLD, HTN, obesity, tobacco use. He was admitted on 5/3 for increasing shortness of breath x 1wk. He also tells me he started having dizziness about 2 days PTA. He denies any blurred/doubl vision, confusion, headache or incoordination.   This morning around 0400 the pt was awakened for daily wt. During this time he became unresponsive for 3-4 min. RRT was called. VSS, NIHSS 0 at that time, but he was taken for stat CTH. This showed what appears to be chronic bilateral cerebellar infarcts. Although it is not listed in his medical history at this time, he has a detailed seizure history after returning from [place] in the 1970s. He was on phenobarbatol and then dilantin for many years, his last seizure was in 1978. Pt states they took him off dilantin few years later when he had no further events. He is on gabapentin 627m BID. [facility] [place] [facility] [place] also states that he has known "brain damage in the back of his brain". He denies this is stroke and also denies any specific head trauma/concussion but was in heavy combat/[employer].  Past Medical History Past Medical History:  Diagnosis Date  . [place]rial [place]utter ([place]    a. recurrent AFlutter with RVR;  b. Amiodarone Rx started 4/16  . CAD (coronary artery disease)    a. LHC 1/16:  mLAD diffuse disease, pLCx mild disease, dLCx with disease but too small for PCI, RCA ok, EF 25-30%  . Chronic pain   . Chronic systolic CHF (congestive heart failure) ([facility]   . COPD (chronic obstructive pulmonary disease) ([place]   . Diabetes mellitus without complication ([facility]   . Hypercholesteremia   . Hypertension   . NICM (nonischemic cardiomyopathy) ([place]     a.dx 2016. b. 2D echo 06/2016 - Last echo 07/01/16: mod dilated LV, mod LVH, EF 25-30%, mild-mod MR, sev LAE, mild-mod reduced RV systolic function, mild-mod TR, PASP 55mG.  . Marland KitchenAF (paroxysmal atrial fibrillation) (HCC)    On amio - ot a candidate for flecainide due to cardiomyopathy, not a candidate for Tikosyn due to prolonged QT, and felt to be a poor candidate for ablation given left atrial size.  . Pulmonary hypertension ([facility]  . Tobacco abuse   Seizures since 1970s  Past Surgical History Past Surgical History:  Procedure Laterality Date  . BIOPSY  03/11/2019   Procedure: BIOPSY;  Surgeon: CiLavena BullionDO;  [place] Service: Gastroenterology;;  . BIOPSY  03/15/2019   Procedure: BIOPSY;  Surgeon: MaIrving Copas MD;  [place]ology;;  . CARDIOVERSION N/A 07/30/2018   Procedure: CARDIOVERSION;  Surgeon: McLarey DresserMD;  [facility];  Service: Cardiovascular;  Laterality: N/A;  . COLONOSCOPY N/A 03/11/2019   Procedure: COLONOSCOPY;  Surgeon: CiLavena BullionDO;  [place];  Laterality: N/A;  . ENTEROSCOPY N/A 03/15/2019   Procedure: ENTEROSCOPY;  Surgeon: MaRush LandmarkaTelford Nab MD;  [place] [facility] [facility]: [place]ology;  Laterality: N/A;  . ESOPHAGOGASTRODUODENOSCOPY Left 03/11/2019   Procedure: ESOPHAGOGASTRODUODENOSCOPY (EGD);  Surgeon: CiLavena BullionDO;  [facility];  Service: Gastroenterology;  Laterality: Left;  . LEFT AND  RIGHT HEART CATHETERIZATION WITH CORONARY ANGIOGRAM N/A 11/06/2014   Procedure: LEFT AND RIGHT HEART CATHETERIZATION WITH CORONARY ANGIOGRAM;  Surgeon: Jettie Booze, MD;  Location: [facility] CATH LAB;  Service: Cardiovascular;  Laterality: N/A;  . RIGHT/LEFT HEART CATH AND CORONARY ANGIOGRAPHY N/A 12/06/2018   Procedure: RIGHT/LEFT HEART CATH AND CORONARY ANGIOGRAPHY;  Surgeon: Larey Dresser, MD;  Location: [place] [facility] LAB;  Service: Cardiovascular;  Laterality: N/A;  . SPLENECTOMY    . SUBMUCOSAL TATTOO INJECTION  03/15/2019   Procedure: SUBMUCOSAL TATTOO INJECTION;  Surgeon: Rush Landmark Telford Nab., MD;  Location: [facility] END[facility]COPY;  Service: Gastroenterology;;    Family History Family History  Problem Relation Age of Onset  . Heart disease Mother   . Hypertension Mother   . Heart failure Mother   . Heart disease Father   . Hypertension Sister   . Heart attack Neg Hx   . Stroke Neg Hx     Social History    reports that he quit smoking about 3 months ago. His smoking use included cigarettes. He has a 34.00 pack-year smoking history. He has never used smokeless tobacco. He reports that he does not drink alcohol or use drugs.  Allergies No Known Allergies  Home Medications Medications Prior to Admission  Medication Sig Dispense Refill  . albuterol (VENTOLIN HFA) 108 (90 Base) MCG/ACT inhaler Inhale 2 puffs into the lungs every 6 (six) hours as needed for wheezing or shortness of breath.    . bisoprolol (ZEBETA) 5 MG tablet Take 0.5 tablets (2.5 mg total) by mouth daily for 30 days. 15 tablet 3  . budesonide-formoterol (SYMBICORT) 160-4.5 MCG/ACT inhaler Inhale 2 puffs into the lungs 2 (two) times daily.    . diazepam (VALIUM) 5 MG tablet Take 5 mg by mouth daily as needed for anxiety.    . fluticasone (FLONASE) 50 MCG/ACT nasal spray Place 2 sprays into both nostrils daily as needed for allergies.     Marland Kitchen gabapentin (NEURONTIN) 600 MG tablet Take 1 tablet (600 mg total) by mouth 2 (two) times daily. 60 tablet 0  . guaifenesin (HUMIBID E) 400 MG TABS tablet Take 1 tablet (400 mg total) by mouth every 6 (six) hours as needed. (Patient taking differently: Take 400 mg by mouth every 6 (six) hours as needed (cough). ) 10 tablet 0  . hydrocortisone (ANUSOL-HC) 2.5 % rectal cream Place 1 application rectally 2 (two) times daily as needed for hemorrhoids or itching.     . hydrocortisone 1 % lotion  Apply 1 application topically See admin instructions. Apply small amount to back twice daily for itchy rash    . insulin aspart protamine - aspart (NOVOLOG MIX 70/30 FLEXPEN) (70-30) 100 UNIT/ML FlexPen Inject 0.05 mLs (5 Units total) into the skin 2 (two) times daily. (Patient taking differently: Inject 30 Units into the skin 2 (two) times daily. ) 15 mL 11  . ipratropium-albuterol (DUONEB) 0.5-2.5 (3) MG/3ML SOLN Take 3 mLs by nebulization 2 (two) times daily as needed (shortness of breath/wheezing).     . isosorbide-hydrALAZINE (BIDIL) 20-37.5 MG tablet Take 1 tablet by mouth 3 (three) times daily. (Patient taking differently: Take 1 tablet by mouth 2 (two) times a day. ) 90 tablet 3  . latanoprost (XALATAN) 0.005 % ophthalmic solution Place 1 drop into both eyes at bedtime.    . magnesium oxide (MAG-OX) 400 MG tablet Take 400 mg by mouth daily.    . metFORMIN (GLUCOPHAGE-XR) 500 MG 24 hr tablet Take 1,000 mg by mouth  2 (two) times daily with a meal.    . metolazone (ZAROXOLYN) 2.5 MG tablet Take 1 tablet (2.5 mg total) by mouth every Wednesday. Take extra Potassium 76mq (40 total, daily) when taking this medication 10 tablet 3  . oxyCODONE-acetaminophen (PERCOCET) 10-325 MG tablet Take 1 tablet by mouth every 6 (six) hours.    . OXYGEN Inhale 3 L into the lungs continuous.     . pantoprazole (PROTONIX) 40 MG tablet Take 1 tablet (40 mg total) by mouth 2 (two) times daily. 60 tablet 0  . polyethylene glycol (MIRALAX / GLYCOLAX) packet Take 17 g by mouth daily. (Patient taking differently: Take 17 g by mouth daily as needed (constipation). ) 14 each 0  . potassium chloride SA (K-DUR) 20 MEQ tablet Take 40 mEq by mouth See admin instructions. Take 2 tablets (40 meq) by mouth every morning, take an additional tablet (20 meq) at night if taking metolazone that day.    . rivaroxaban (XARELTO) 20 MG TABS tablet Take 1 tablet (20 mg total) by mouth daily with supper. (Patient taking differently: Take 20 mg  by mouth at bedtime. ) 30 tablet 4  . rosuvastatin (CRESTOR) 40 MG tablet Take 20 mg by mouth daily.    . sodium chloride (OCEAN) 0.65 % SOLN nasal spray Place 2 sprays into both nostrils as needed for congestion.     . torsemide (DEMADEX) 20 MG tablet Take 4 tablets (80 mg total) by mouth 2 (two) times daily for 30 days. 240 tablet 0  . acetaminophen (TYLENOL) 160 MG/5ML solution Take 20.3 mLs (650 mg total) by mouth every 6 (six) hours as needed for mild pain, headache or fever. (Patient not taking: Reported on 03/06/2019) 120 mL 0  . diclofenac sodium (VOLTAREN) 1 % GEL Apply 2 g topically 4 (four) times daily. (Patient not taking: Reported on 03/07/2019) 1 Tube 1  . Magnesium Oxide 420 (252 Mg) MG TABS Take 420 mg by mouth 2 (two) times daily. (Patient not taking: Reported on 03/06/2019) 60 tablet 0  . Multiple Vitamin (MULTIVITAMIN WITH MINERALS) TABS tablet Take 1 tablet by mouth daily. (Patient not taking: Reported on 03/06/2019)    . potassium chloride SA (K-DUR,KLOR-CON) 20 MEQ tablet Take 2 tablets (40 mEq total) by mouth every morning AND 1 tablet (20 mEq total) at bedtime. (Patient not taking: Reported on 03/06/2019) 90 tablet 0  . rosuvastatin (CRESTOR) 20 MG tablet Take 1 tablet (20 mg total) by mouth daily. (Patient not taking: Reported on 03/06/2019) 30 tablet 0    Hospital Medications . acetaZOLAMIDE  250 mg Oral BID  . acetylcysteine  2 mL Nebulization BID  . apixaban  5 mg Oral BID  . arformoterol  15 mcg Nebulization BID  . bisoprolol  2.5 mg Oral Daily  . budesonide (PULMICORT) nebulizer solution  0.5 mg Nebulization BID  . docusate sodium  100 mg Oral Daily  . ferrous sulfate  325 mg Oral BID WC  . folic acid  1 mg Oral Daily  . gabapentin  600 mg Oral BID  . guaiFENesin  400 mg Oral BID  . insulin aspart  0-20 Units Subcutaneous TID WC  . insulin aspart  0-5 Units Subcutaneous QHS  . insulin aspart  12 Units Subcutaneous TID WC  . insulin NPH Human  35 Units Subcutaneous BID AC  & HS  . ipratropium-albuterol  3 mL Nebulization TID  . isosorbide-hydrALAZINE  1 tablet Oral TID  . latanoprost  1 drop Both Eyes QHS  .  mouth rinse  15 mL Mouth Rinse BID  . nicotine  7 mg Transdermal Daily  . pantoprazole  40 mg Oral BID  . polyethylene glycol  17 g Oral Daily  . potassium chloride  60 mEq Oral QID  . [START ON 03/19/2019] predniSONE  10 mg Oral Q breakfast  . rosuvastatin  20 mg Oral q1800  . sodium chloride flush  10-40 mL Intracatheter Q12H  . sodium chloride flush  3 mL Intravenous Q12H  . spironolactone  25 mg Oral Daily     ROS: History obtained from Pt  General ROS: negative for - chills, fatigue, fever, night sweats, weight gain or weight loss Psychological ROS: negative for - behavioral disorder, hallucinations, memory difficulties, mood swings or suicidal ideation Ophthalmic ROS: negative for - blurry vision, double vision, eye pain or loss of vision ENT ROS: negative for - epistaxis, nasal discharge, oral lesions, sore throat, tinnitus or vertigo Allergy and Immunology ROS: negative for - hives or itchy/watery eyes Hematological and Lymphatic ROS: negative for - bleeding problems, bruising or swollen lymph nodes Endocrine ROS: negative for - galactorrhea, hair pattern changes, polydipsia/polyuria or temperature intolerance Respiratory ROS: negative for - cough, hemoptysis, shortness of breath or wheezing Cardiovascular ROS: negative for - chest pain. He does note some dyspnea on exertion and ongoing LE edema and irregular heartbeat Gastrointestinal ROS: negative for - abdominal pain, diarrhea, hematemesis, nausea/vomiting or stool incontinence Genito-Urinary ROS: negative for - dysuria, hematuria, incontinence or urinary frequency/urgency Musculoskeletal ROS: negative for - joint swelling or muscular weakness Neurological ROS: as noted in HPI Dermatological ROS: negative for rash and skin lesion changes   Physical Examination:  Vitals:   03/17/19  2255 03/18/19 0015 03/18/19 0300 03/18/19 0500  BP:  (!) 107/55 112/63   Pulse: 85 89 91   Resp: 19 (!) 21 15   Temp:  98 F (36.7 C) 98.7 F (37.1 C)   TempSrc:  Oral Axillary   SpO2: 98% 99% 100%   Weight:    124.9 kg  Height:        General - obese, mild distress Heart - Regular rate and rhythm - no murmer Lungs - Clear to auscultation Abdomen - Soft - non tender Extremities - Distal pulses intact - no edema Skin - Warm and dry  Neurologic Examination:  Mental Status:  Alert, oriented except for gets date mixed up at first and then self corrects moments later. Thought content appropriate for most part, but angers easily during interview and fixates on why he has no breakfast (NPO for TEE which is now canceled). Speech without evidence of dysarthria or aphasia. Able to follow 3 step commands without difficulty.  Cranial Nerves:  II-bilateral visual fields intact III/IV/VI-Pupils were equal and reacted. Extraocular movements were full.  V/VII-no facial numbness and no facial weakness.  VIII-hearing normal.  X-normal speech and symmetrical palatal movement.  XII-midline tongue extension  Motor: There is gen weakness in bilat LEs d/t edema; no focal deficit Right : Upper extremity   5/5    Left:     Upper extremity   5/5  Lower extremity   4+/5    Lower extremity   4+/5 Tone and bulk:normal tone throughout; no atrophy noted Sensory: Intact to light touch in all extremities. Deep Tendon Reflexes: 1+/4 throughout Plantars: Downgoing bilaterally  Cerebellar: Normal finger to nose; heel to shin bilaterally is limited d/t edema and bilat LE dressings/wrap to knees and gen weakness to preform/complete task. Gait: not tested  LABORATORY STUDIES:  Basic Metabolic Panel: Recent Labs  Lab 03/14/19 0734 03/14/19 1619 03/15/19 0416 03/16/19 0430 03/17/19 0520 03/18/19 0700  NA  --  130* 132* 134* 137 137  K  --  3.6 3.3* 3.4* 3.2* 3.2*  CL  --  75* 82* 83* 85* 91*  CO2  --   37* 37* 40* 40* 35*  GLUCOSE  --  211* 258* 291* 205* 129*  BUN  --  69* 75* 75* 75* 71*  CREATININE  --  2.09* 2.25* 2.10* 2.06* 1.98*  CALCIUM  --  8.6* 8.5* 8.5* 8.5* 8.1*  MG 1.9  --   --   --  2.0 2.0    Liver Function Tests: No results for input(s): AST, ALT, ALKPHOS, BILITOT, PROT, ALBUMIN in the last 168 hours. No results for input(s): LIPASE, AMYLASE in the last 168 hours. No results for input(s): AMMONIA in the last 168 hours.  CBC: Recent Labs  Lab 03/12/19 0217 03/13/19 0416 03/15/19 0416 03/16/19 0430 03/17/19 0520 03/18/19 0700  WBC 10.5 7.1 11.6* 9.9 10.4 10.5  NEUTROABS 8.3* 6.1  --   --   --   --   HGB 8.4* 7.9* 7.9* 8.1* 7.8* 8.4*  HCT 28.4* 26.5* 26.3* 27.7* 26.5* 29.3*  MCV 83.3 84.7 85.1 84.5 85.8 87.7  PLT 447* 379 308 308 281 310    Cardiac Enzymes: No results for input(s): CKTOTAL, CKMB, CKMBINDEX, TROPONINI in the last 168 hours.  BNP: Invalid input(s): POCBNP  CBG: Recent Labs  Lab 03/17/19 1620 03/17/19 2102 03/17/19 2235 03/18/19 0412 03/18/19 0625  GLUCAP 250* 248* 250* 204* 170*    Microbiology:   Coagulation Studies: No results for input(s): LABPROT, INR in the last 72 hours.  Urinalysis: No results for input(s): COLORURINE, LABSPEC, PHURINE, GLUCOSEU, HGBUR, BILIRUBINUR, KETONESUR, PROTEINUR, UROBILINOGEN, NITRITE, LEUKOCYTESUR in the last 168 hours.  Invalid input(s): APPERANCEUR  Lipid Panel:     Component Value Date/Time   CHOL 83 03/08/2019 0412   TRIG 109 03/08/2019 0412   HDL 43 03/08/2019 0412   CHOLHDL 1.9 03/08/2019 0412   VLDL 22 03/08/2019 0412   LDLCALC 18 03/08/2019 0412    HgbA1C:  Lab Results  Component Value Date   HGBA1C 6.5 (H) 11/13/2018    Urine Drug Screen:      Component Value Date/Time   LABOPIA POSITIVE (A) 12/30/2018 1759   COCAINSCRNUR NONE DETECTED 12/30/2018 1759   LABBENZ POSITIVE (A) 12/30/2018 1759   AMPHETMU NONE DETECTED 12/30/2018 1759   THCU NONE DETECTED 12/30/2018 1759    LABBARB NONE DETECTED 12/30/2018 1759     Alcohol Level:  No results for input(s): ETH in the last 168 hours.  Miscellaneous labs:  EKG  EKG  IMAGING: Ct Head Wo Contrast  Result Date: 03/18/2019 CLINICAL DATA:  73 year old male with seizure-like activity, altered mental status. EXAM: CT HEAD WITHOUT CONTRAST TECHNIQUE: Contiguous axial images were obtained from the base of the skull through the vertex without intravenous contrast. COMPARISON:  None. FINDINGS: Brain: Linear hypodensities in both cerebellar hemispheres most compatible with small infarcts, the larger is on the right, and appears more age indeterminate. The smaller area on the left appears chronic. Comparatively mild cerebral white matter hypodensity in both hemispheres. No cortical encephalomalacia identified. Deep gray matter nuclei are within normal limits for age. No midline shift, ventriculomegaly, mass effect, evidence of mass lesion, intracranial hemorrhage or evidence of cortically based acute infarction. Cavum septum pellucidum, normal variant. Vascular: Calcified atherosclerosis at the skull  base. No suspicious intracranial vascular hyperdensity. Skull: No acute osseous abnormality identified. Sinuses/Orbits: Paranasal sinuses and mastoids are well pneumatized. Other: Visualized orbits and scalp soft tissues are within normal limits. IMPRESSION: 1. Small bilateral cerebellar infarcts, probably chronic although that on the right is age indeterminate. 2. Largely normal for age noncontrast CT appearance of the cerebral hemispheres and no other acute intracranial abnormality. Electronically Signed   By: Genevie Ann M.D.   On: 03/18/2019 05:45     Assessment/Plan: This is a 73 yr old man PMH of Afib on Xarelto, COPD, HFpEF, DM2, HLD, HTN, obesity,and remote seizure history.   # Seizure disorder- detailed history since 4s post [place] combat. This could be post concussive/percussive in combat setting. Also must consider CTE.   # Unresponsive state- pt had 3-4 min spell this am upon awakening. EEG to evaluate. Ddx: seizures, orthostatic hypotension upon standing # Abnormal CTH- appears to have chronic bilat cerebellar infarcts. ddx CTE, stroke. MRI to better evaluate  Plan: MRI brain EEG Consider increase Gabapentin to TID dosing Consider adding other ASD pending EEG results [facility]izure precautions [facility]  Desiree Metzger-Cihelka, ARNP-C, ANVP-BC Attending neurologist's note to follow   I have seen the patient and reviewed the above note.  73 year old male states that he was angry at the nurse and therefore just did not talk to her.  He is already on gabapentin which is an antiepileptic and has not had any other seizure since the 1970s.  Therefore even if this did represent a small breakthrough seizure, I would not favor changing his medications at this time.  The MRI results could be helpful, though if the patient refuses a statin I would not perform any other testing at this time.  1) MRI brain 2) EEG results pending 3) if the above are negative, no further recommendations.  Roland Rack, MD [facility] [phone]  If 7pm- 7am, please page neurology on call as listed in [place].

## 2019-03-18 NOTE — Progress Notes (Addendum)
PROGRESS NOTE    Nicholas Caldwell  LJQ:[id number] DOB: 1946/07/11 DOA: 03/06/2019 PCP: [facility]   Brief Narrative:   Nicholas Caldwell is a 73 y.o. M with CAD, isch CM EF 40%, sdCHF, pAF on Xarelto, pHTN, MO, OSA not compliant with CPAP, COPD on home O2 3L, and smoking who presented with swelling and dyspnea.  Patient had had slow progressive dyspnea, SOB with exertion, also significant peripheral edema.  In ER, BNP >1000, CXR with edema, HR 124 in Afib, and new anemia Hgb 6.7 g/dL. Transfused and started on Lasix.   Assessment & Plan:   Principal Problem:   Acute on chronic respiratory failure with hypoxia and hypercapnia (HCC) Active Problems:   Essential hypertension   Type 2 diabetes mellitus with neuropathy   PAF (paroxysmal atrial fibrillation) (HCC)   COPD (chronic obstructive pulmonary disease) (HCC)   Medically noncompliant   Atrial fibrillation with RVR (HCC)   AKI (acute kidney injury) ([place])   Acute on chronic anemia   Acute on chronic systolic (congestive) heart failure (HCC)   Acute on chronic systolic CHF (congestive heart failure) (HCC)   Iron deficiency anemia   NSVT (nonsustained ventricular tachycardia) (HCC)   Tobacco abuse counseling   Gastritis and gastroduodenitis   Acute versus chronic CVA versus TIA Overnight, patient was apparently confused and nonverbal for a few seconds.  Patient states he was angry at nurses and was just not communicating.  Rapid response was initiated with a CT head notable for small bilateral cerebellar infarcts, question chronic versus acute.  Neurology was consulted overnight with recommendations of MR brain and EEG. --Patient appears to be at his normal baseline today --Lipid panel 5/2 w/ TC 83, LDL 18, HDL 43, TG 109 --HbA1c 6.07 Nov 2018 --MR brain and EEG pending --Formal neuro consult pending --continue crestor --will defer asa rec's to neurology given recent GIB/ABLA and now on NOAC --continue to monitor on  telemetry  Acute on chronic hypoxic respiratory failure: resolved Patient presenting from home with progressive dyspnea associated with worsening lower extremity edema.  Likely multifactorial from acute CHF exacerbation and COPD flare, as well as anemia, NSVT/AF.  Chest x-ray on admission notable for bilateral interstitial and alveolar airspace opacities with cardiomegaly. --Now titrated down to home O2 at 3 L per nasal cannula --Continue supportive care  Acute on chronic systolic and diastolic CHF EF 21-97% on TEE in Mar.   --Cardiology following, appreciate assistance --Net negative 1.7L yesterday; net negative 23L since admission --Milrinone, Acetazolamide and Lasix gtt per Cardiology --metolazone 70m on 5/14 --Continue spironolactone --Continue BiDIL --Continue Crestor --Continue BB --Cardiac, low-salt diet, fluid restrict to 1200 mL's per day  Atrial fibrillation/flutter Continues with persistent A. fib/flutter on telemetry.  Cardiology following, appreciate assistance.  Has been discussed with EP, not a optimal ablation candidate, may consider in the future. --Started on heparin drip on 03/15/2019; transition to Eliquis 5/14, Hgb stable 8.4 --continue beta-blocker --Continue to monitor on telemetry --Cardiology plans TEE/DCCV now on monday  Acute kidney injury Creatinine baseline 1.2 for the last 6 months, roughly, trending up from 0.9 last few years.  AKI again this hospitalization, has been increasing last few days with diuresis. --Cr 2.25-->2.10-->2.06-->1.98 --Lasix drip rate decreased by cardiology, now at 120mhr, continues on acetazolamide and milrinone --Daily BMP  Iron deficiency anemia Acute blood loss anemia Upper GI bleed Suspected chronic GI blood loss from gastric ulcer.  Received 2 units PRBCs during the hospitalization. EGD 5/12 with multiple nonbleeding erosions, one nonbleeding superficial  gastric ulcer with clean ulcer base --Pathology from stomach  biopsy negative for metaplasia, dysplasia or malignancy, no H. pylori --s/p Feraheme 5/4, 5/12, and repeat in one week (5/19) --Continue protonix 64m BID x 12 weeks --Minimize NSAID use --GI recommends outpatient follow-up and consideration of gastric mapping in the future as an outpatient; will need follow-up with Dr. CBryan Lemma  Acute COPD exacerbation Patient with home O2 dependence at 3 L per nasal cannula. --Continue prednisone taper --Doxycycline 100 mg p.o. twice daily, plan 7-day course --Pulmicort/Brovana --Continue nebs --Chest PT, Mucinex, Mucomyst prn --Continue to titrate supplemental oxygen to maintain SPO2 greater than 88%  Positive blood culture CoNS in 1/2.  Likely contaminant.  Hypokalemia -K 3.2 today, potassium increased to 60 mg 3 times daily; hypokalemic exacerbated by Lasix drip --Repeat electrolytes with magnesium in the a.m.  Diabetes Hemoglobin A1c 6.5 on 11/13/2018.  Home regimen includes metformin 1000 mg p.o. twice daily, NPH 70-30 5u BID. Glucose elevated to 291 this AM, likely exacerbated by steroid use  --increase NPH from 30u to 35u SQ BID --[facility]high dose Insulin sliding scale for further coverage --Hopeful glucose will improve as steroids being tapered off  Hypertension Coronary disease secondary prevention -Continue Crestor, BB  Paroxysmal atrial fibrillation NSVT CHA2DS2-Vasc 4 (Age, CHF, PVD, DM).   -continue anticoagulation with Eliquis -Continue BB  OHS/OSA/MO --Noncompliant with CPAP at home per report   DVT prophylaxis:  Heparin drip Code Status: FULL CODE Family Communication: none Disposition Plan: inpatient, plan TEE/DCCV on Friday   Consultants:   Cardiology  Procedures:   EGD 03/15/2019  Antimicrobials:   Doxycycline   Subjective: Patient seen and examined at bedside, resting comfortably.  Overnight, rapid response was initiated for patient with nonverbal communication  for several seconds.  Patient states he was just angry at the nurses and was just not communicating.  Underwent CT head with bilateral cerebellar small infarcts, chronic versus acute.  Neurology was consulted with recommendations of EEG and MRI brain.  Patient appears back to his normal baseline.  Cardiology now has deferred TEE/DCCV until Monday due to events overnight.  Patient appears frustrated about this.  No other complaints at this time.  Breathing improved.  Continues with good diuresis.  Denies headache, no chest pain, no shortness of breath from his normal baseline, no palpitations, no abdominal pain, no weakness, no cough/congestion, no fever/chills/night sweats, no paresthesias.  No other acute events overnight per nursing staff.  Objective: Vitals:   03/17/19 2255 03/18/19 0015 03/18/19 0300 03/18/19 0500  BP:  (!) 107/55 112/63   Pulse: 85 89 91   Resp: 19 (!) 21 15   Temp:  98 F (36.7 C) 98.7 F (37.1 C)   TempSrc:  Oral Axillary   SpO2: 98% 99% 100%   Weight:    124.9 kg  Height:        Intake/Output Summary (Last 24 hours) at 03/18/2019 1014 Last data filed at 03/18/2019 0745 Gross per 24 hour  Intake 1433.89 ml  Output 4175 ml  Net -2741.11 ml   Filed Weights   03/16/19 0346 03/17/19 0356 03/18/19 0500  Weight: 125.8 kg 125.3 kg 124.9 kg    Examination:  General exam: Appears calm and comfortable  Respiratory system: Breath sounds slightly decreased bilateral bases with crackles, normal respiratory effort on 3 L nasal cannula Cardiovascular system: S1 & S2 heard, irregularly irregular rhythm, normal rate. No JVD, murmurs, rubs, gallops or clicks.  2+ pedal edema bilaterally  Gastrointestinal system: Abdomen is nondistended, soft and nontender. No organomegaly or masses felt. Normal bowel sounds heard. Central nervous system: Alert and oriented. No focal neurological deficits. Extremities: Symmetric 5 x 5 power. Skin: No rashes, lesions or ulcers Psychiatry:  Judgement and insight appear normal. Mood & affect appropriate.     Data Reviewed: I have personally reviewed following labs and imaging studies  CBC: Recent Labs  Lab 03/12/19 0217 03/13/19 0416 03/15/19 0416 03/16/19 0430 03/17/19 0520 03/18/19 0700  WBC 10.5 7.1 11.6* 9.9 10.4 10.5  NEUTROABS 8.3* 6.1  --   --   --   --   HGB 8.4* 7.9* 7.9* 8.1* 7.8* 8.4*  HCT 28.4* 26.5* 26.3* 27.7* 26.5* 29.3*  MCV 83.3 84.7 85.1 84.5 85.8 87.7  PLT 447* 379 308 308 281 268   Basic Metabolic Panel: Recent Labs  Lab 03/14/19 0734 03/14/19 1619 03/15/19 0416 03/16/19 0430 03/17/19 0520 03/18/19 0700  NA  --  130* 132* 134* 137 137  K  --  3.6 3.3* 3.4* 3.2* 3.2*  CL  --  75* 82* 83* 85* 91*  CO2  --  37* 37* 40* 40* 35*  GLUCOSE  --  211* 258* 291* 205* 129*  BUN  --  69* 75* 75* 75* 71*  CREATININE  --  2.09* 2.25* 2.10* 2.06* 1.98*  CALCIUM  --  8.6* 8.5* 8.5* 8.5* 8.1*  MG 1.9  --   --   --  2.0 2.0   GFR: Estimated Creatinine Clearance: 47.4 mL/min (A) (by C-G formula based on SCr of 1.98 mg/dL (H)). Liver Function Tests: No results for input(s): AST, ALT, ALKPHOS, BILITOT, PROT, ALBUMIN in the last 168 hours. No results for input(s): LIPASE, AMYLASE in the last 168 hours. No results for input(s): AMMONIA in the last 168 hours. Coagulation Profile: No results for input(s): INR, PROTIME in the last 168 hours. Cardiac Enzymes: No results for input(s): CKTOTAL, CKMB, CKMBINDEX, TROPONINI in the last 168 hours. BNP (last 3 results) No results for input(s): PROBNP in the last 8760 hours. HbA1C: No results for input(s): HGBA1C in the last 72 hours. CBG: Recent Labs  Lab 03/17/19 1620 03/17/19 2102 03/17/19 2235 03/18/19 0412 03/18/19 0625  GLUCAP 250* 248* 250* 204* 170*   Lipid Profile: No results for input(s): CHOL, HDL, LDLCALC, TRIG, CHOLHDL, LDLDIRECT in the last 72 hours. Thyroid Function Tests: No results for input(s): TSH, T4TOTAL, FREET4, T3FREE, THYROIDAB  in the last 72 hours. Anemia Panel: No results for input(s): VITAMINB12, FOLATE, FERRITIN, TIBC, IRON, RETICCTPCT in the last 72 hours. Sepsis Labs: No results for input(s): PROCALCITON, LATICACIDVEN in the last 168 hours.  No results found for this or any previous visit (from the past 240 hour(s)).       Radiology Studies: Ct Head Wo Contrast  Result Date: 03/18/2019 CLINICAL DATA:  73 year old male with seizure-like activity, altered mental status. EXAM: CT HEAD WITHOUT CONTRAST TECHNIQUE: Contiguous axial images were obtained from the base of the skull through the vertex without intravenous contrast. COMPARISON:  None. FINDINGS: Brain: Linear hypodensities in both cerebellar hemispheres most compatible with small infarcts, the larger is on the right, and appears more age indeterminate. The smaller area on the left appears chronic. Comparatively mild cerebral white matter hypodensity in both hemispheres. No cortical encephalomalacia identified. Deep gray matter nuclei are within normal limits for age. No midline shift, ventriculomegaly, mass effect, evidence of mass lesion, intracranial hemorrhage or evidence of cortically based acute infarction. Cavum septum pellucidum, normal  variant. Vascular: Calcified atherosclerosis at the skull base. No suspicious intracranial vascular hyperdensity. Skull: No acute osseous abnormality identified. Sinuses/Orbits: Paranasal sinuses and mastoids are well pneumatized. Other: Visualized orbits and scalp soft tissues are within normal limits. IMPRESSION: 1. Small bilateral cerebellar infarcts, probably chronic although that on the right is age indeterminate. 2. Largely normal for age noncontrast CT appearance of the cerebral hemispheres and no other acute intracranial abnormality. Electronically Signed   By: Genevie Ann M.D.   On: 03/18/2019 05:45        Scheduled Meds:  acetaZOLAMIDE  250 mg Oral BID   acetylcysteine  2 mL Nebulization BID   apixaban  5  mg Oral BID   arformoterol  15 mcg Nebulization BID   bisoprolol  2.5 mg Oral Daily   budesonide (PULMICORT) nebulizer solution  0.5 mg Nebulization BID   docusate sodium  100 mg Oral Daily   ferrous sulfate  325 mg Oral BID WC   folic acid  1 mg Oral Daily   gabapentin  600 mg Oral BID   guaiFENesin  400 mg Oral BID   insulin aspart  0-20 Units Subcutaneous TID WC   insulin aspart  0-5 Units Subcutaneous QHS   insulin aspart  12 Units Subcutaneous TID WC   insulin NPH Human  35 Units Subcutaneous BID AC & HS   ipratropium-albuterol  3 mL Nebulization TID   isosorbide-hydrALAZINE  1 tablet Oral TID   latanoprost  1 drop Both Eyes QHS   mouth rinse  15 mL Mouth Rinse BID   nicotine  7 mg Transdermal Daily   pantoprazole  40 mg Oral BID   polyethylene glycol  17 g Oral Daily   potassium chloride  60 mEq Oral QID   [START ON 03/19/2019] predniSONE  10 mg Oral Q breakfast   rosuvastatin  20 mg Oral q1800   sodium chloride flush  10-40 mL Intracatheter Q12H   sodium chloride flush  3 mL Intravenous Q12H   spironolactone  25 mg Oral Daily   Continuous Infusions:  sodium chloride     furosemide (LASIX) infusion 10 mg/hr (03/18/19 0500)   milrinone 0.25 mcg/kg/min (03/18/19 0757)     LOS: 12 days    Time spent: 38 minutes    Modene Andy J [place] DO [facility] Pager [phone]  If 7PM-7AM, please contact night-coverage [email or web address] Password [email or web address] 03/18/2019, 10:14 AM

## 2019-03-18 NOTE — Progress Notes (Signed)
Patient ID: Nicholas Caldwell, male   DOB: April 08, 1946, 73 y.o.   MRN: [id number]     Advanced Heart Failure Rounding Note  PCP-Cardiologist: No primary care provider on file.   Subjective:    Milrinone started 5/10 at 0.25.   Remains on lasix gtt at 10 and milrinone. CVP climbing again. Now up to 19. Weight up. Creatinine improving.   Says they woke him up at 4a to weigh him and he was upset and wouldn't talk to them. Seen by [facility] and did full Neuro w/u and CT. No acute findings. He has perfect memory of entire event.  No deficits now. TEE cancelled.   Remains in AFL. On low dose heparin. Hgb 7.9 -> 8.1 -> 7.8 -> 8.4 No obvious bleeding  Denies CP or SOB.   Objective:   Weight Range: 124.9 kg Body mass index is 37.34 kg/m.   Vital Signs:   Temp:  [98 F (36.7 C)-98.7 F (37.1 C)] 98.7 F (37.1 C) (05/15 0300) Pulse Rate:  [85-108] 91 (05/15 0300) Resp:  [15-21] 15 (05/15 0300) BP: (102-112)/(55-70) 112/63 (05/15 0300) SpO2:  [96 %-100 %] 100 % (05/15 0300) Weight:  [124.9 kg] 124.9 kg (05/15 0500) Last BM Date: 03/16/19  Weight change: Filed Weights   03/16/19 0346 03/17/19 0356 03/18/19 0500  Weight: 125.8 kg 125.3 kg 124.9 kg    Intake/Output:   Intake/Output Summary (Last 24 hours) at 03/18/2019 1117 Last data filed at 03/18/2019 0745 Gross per 24 hour  Intake 1433.89 ml  Output 4175 ml  Net -2741.11 ml      Physical Exam    General:  Obese male sitting in bed. No resp difficulty HEENT: normal Neck: supple. JVP to ear Carotids 2+ bilat; no bruits. No lymphadenopathy or thryomegaly appreciated. Cor: PMI nondisplaced. Irr tachy  2/6 TR Lungs: clear Abdomen: obese soft, nontender, + distended. No hepatosplenomegaly. No bruits or masses. Good bowel sounds. Extremities: no cyanosis, clubbing, rash, 2+ edema + unna boots Neuro: alert & orientedx3, cranial nerves grossly intact. moves all 4 extremities w/o difficulty. Affect pleasant     Telemetry   Atypical  atrial flutter rate in 80s-100s (personally reviewed).   Labs    CBC Recent Labs    03/17/19 0520 03/18/19 0700  WBC 10.4 10.5  HGB 7.8* 8.4*  HCT 26.5* 29.3*  MCV 85.8 87.7  PLT 281 202   Basic Metabolic Panel Recent Labs    03/17/19 0520 03/18/19 0700  NA 137 137  K 3.2* 3.2*  CL 85* 91*  CO2 40* 35*  GLUCOSE 205* 129*  BUN 75* 71*  CREATININE 2.06* 1.98*  CALCIUM 8.5* 8.1*  MG 2.0 2.0   Liver Function Tests No results for input(s): AST, ALT, ALKPHOS, BILITOT, PROT, ALBUMIN in the last 72 hours. No results for input(s): LIPASE, AMYLASE in the last 72 hours. Cardiac Enzymes No results for input(s): CKTOTAL, CKMB, CKMBINDEX, TROPONINI in the last 72 hours.  BNP: BNP (last 3 results) Recent Labs    12/01/18 0015 12/30/18 1621 03/06/19 1829  BNP 1,666.7* 1,273.0* 1,020.1*    ProBNP (last 3 results) No results for input(s): PROBNP in the last 8760 hours.   D-Dimer No results for input(s): DDIMER in the last 72 hours. Hemoglobin A1C No results for input(s): HGBA1C in the last 72 hours. Fasting Lipid Panel No results for input(s): CHOL, HDL, LDLCALC, TRIG, CHOLHDL, LDLDIRECT in the last 72 hours. Thyroid Function Tests No results for input(s): TSH, T4TOTAL, T3FREE, THYROIDAB in the last  72 hours.  Invalid input(s): FREET3  Other results:   Imaging    Ct Head Wo Contrast  Result Date: 03/18/2019 CLINICAL DATA:  73 year old male with seizure-like activity, altered mental status. EXAM: CT HEAD WITHOUT CONTRAST TECHNIQUE: Contiguous axial images were obtained from the base of the skull through the vertex without intravenous contrast. COMPARISON:  None. FINDINGS: Brain: Linear hypodensities in both cerebellar hemispheres most compatible with small infarcts, the larger is on the right, and appears more age indeterminate. The smaller area on the left appears chronic. Comparatively mild cerebral white matter hypodensity in both hemispheres. No cortical  encephalomalacia identified. Deep gray matter nuclei are within normal limits for age. No midline shift, ventriculomegaly, mass effect, evidence of mass lesion, intracranial hemorrhage or evidence of cortically based acute infarction. Cavum septum pellucidum, normal variant. Vascular: Calcified atherosclerosis at the skull base. No suspicious intracranial vascular hyperdensity. Skull: No acute osseous abnormality identified. Sinuses/Orbits: Paranasal sinuses and mastoids are well pneumatized. Other: Visualized orbits and scalp soft tissues are within normal limits. IMPRESSION: 1. Small bilateral cerebellar infarcts, probably chronic although that on the right is age indeterminate. 2. Largely normal for age noncontrast CT appearance of the cerebral hemispheres and no other acute intracranial abnormality. Electronically Signed   By: Genevie Ann M.D.   On: 03/18/2019 05:45     Medications:     Scheduled Medications:  acetaZOLAMIDE  250 mg Oral BID   acetylcysteine  2 mL Nebulization BID   apixaban  5 mg Oral BID   arformoterol  15 mcg Nebulization BID   bisoprolol  2.5 mg Oral Daily   budesonide (PULMICORT) nebulizer solution  0.5 mg Nebulization BID   docusate sodium  100 mg Oral Daily   ferrous sulfate  325 mg Oral BID WC   folic acid  1 mg Oral Daily   gabapentin  600 mg Oral BID   guaiFENesin  400 mg Oral BID   insulin aspart  0-20 Units Subcutaneous TID WC   insulin aspart  0-5 Units Subcutaneous QHS   insulin aspart  12 Units Subcutaneous TID WC   insulin NPH Human  35 Units Subcutaneous BID AC & HS   ipratropium-albuterol  3 mL Nebulization TID   isosorbide-hydrALAZINE  1 tablet Oral TID   latanoprost  1 drop Both Eyes QHS   mouth rinse  15 mL Mouth Rinse BID   metolazone  5 mg Oral Once   nicotine  7 mg Transdermal Daily   pantoprazole  40 mg Oral BID   polyethylene glycol  17 g Oral Daily   potassium chloride  60 mEq Oral QID   [START ON 03/19/2019]  predniSONE  10 mg Oral Q breakfast   rosuvastatin  20 mg Oral q1800   sodium chloride flush  10-40 mL Intracatheter Q12H   sodium chloride flush  3 mL Intravenous Q12H   spironolactone  25 mg Oral Daily    Infusions:  sodium chloride     furosemide (LASIX) infusion 10 mg/hr (03/18/19 0500)   milrinone 0.25 mcg/kg/min (03/18/19 0757)    PRN Medications: sodium chloride, acetaminophen, diazepam, fluticasone, hydrocortisone cream, ondansetron (ZOFRAN) IV, oxyCODONE-acetaminophen, sodium chloride flush, sodium chloride flush   Assessment/Plan   1. Acute on chronic systolic CHF: Nonischemic cardiomyopathy.  With prominent RV failure, likely related to COPD/OSA/OHS. TEE in 3/20 with EF 40-45%, severely dilated RV with moderately decreased systolic function. He has had multiple recent admissions with CHF and COPD exacerbations.  He has not cooperated with paramedicine or  [facility] home [facility] as outpatient, has not been using CPAP per his wife, suspect significant medication noncompliance as well though he denies.   - Milrinone started 5/9. Now on 0.25 co-ox pending but has been in the 70% range - He remains on lasix drip with marked volume overload. CVP 21-> 18 -> 15 -> 16 -> 18 -> 19 - Lasix cut back to 10 mg/hr due to AKI. Will increase lasix back to 15/hr. Continue metolazone and diamox.  - Continue UNNA boots - Continue bisoprolol 2.5 daily.  - Continue Bidil 1 tab tid.  - Continue spironolactone 25 mg daily.    - Cardiomems may be helpful as outpatient.  2. Anemia: Baseline hgb around 8.  6.7 at admission, denies overt GI bleeding.  He has had 2 unit PRBCs and IV Fe.  Hgb 7.9 today, lower but no overt bleeding.  Colonoscopy 5/8 unremarkable but EGD showed necrotic mucosa gastric fundus/body that is concerning for gastric cancer, biopsies taken.  - Hgb relatively stable at 8.3-> 7.9. -> 8.1-> 7.8- > 8.4 - Gastric biopsy negative for malignancy. Repeat endoscopy 5/12 showed  inflammatory and friable gastric mucosa and a non bleeding ulcer.   - Tolerating Eliquis - IV iron per primary team and GI 3. Atrial fibrillation/flutter: Patient has history of paroxysmal atrial fibrillation.  This admission, he appears to be in an atypical atrial flutter.  Rate is currently controlled. Xarelto on hold with GI bleeding.  Was seen in past by Dr. Rayann Heman, not thought to be good ablation candidate.  He has not been on Tikosyn or amiodarone due to history of long QT.  - Would like to do TEE-guided DCCV - Cancelled today due to ? Neuro episode. Will reschedule for Monday once volume status improved - Will eventually ask EP to reconsider ablation given lack of good anti-arrhythmic options. He seems to do better in NSR.  4. COPD: Severe, on 3L home oxygen.  He was wheezing initially but now much improved.  This may be due to pulmonary edema, but also consider component of COPD exacerbation.  - Getting Duonebs.  - He is now on prednisone and doxycycline 100 mg bid.   - Per primary team  5. OHS/OSA: On home oxygen.  Per wife, has not been using CPAP at home.  Will give CPAP at night here.  6. ID: Suspect this is primarily a CHF exacerbation.  Afebrile and WBCs not significantly elevated.  1 blood culture positive for what appears to be coagulase negative Staph (probably contaminant).  COVID-19 negative.  7. CAD: Nonobstructive on prior cath.  No s/s ischemia. He is on Crestor. Off ASA with DOAC and GI bleed  8. Acute on chronic kidney failure III - Baseline seems to be 1.3-1.6 - Creatinine back down today. 1.9-> 2.2 -> 2.1 -> 2.0-> 1.98. Plan as above 9. Hypokalemia - will supp  Length of Stay: 12  Glori Bickers, MD  03/18/2019, 11:17 AM  Advanced Heart Failure Team Pager [phone] (M-F; [place])  Please contact [facility] for night-coverage after hours (4p -7a ) and weekends on [email or web address]

## 2019-03-18 NOTE — Progress Notes (Signed)
Nutrition Brief Note  RD drawn to pt due to LOS.  Spoke with pt via phone call to room. Pt reports that he is waiting on his breakfast and asks when it will arrive. RD called [facility] who reports pt has a breakfast order in that will arrive around 10:00 am.  Pt shares that he has a good appetite and states "I'm hungry." Pt reports that his appetite has been good both during and admission and PTA.  Pt reports that his weight has been stable and denies any unintentional weight loss. Pt reports his UBW as "160 something."  Pt denies any nutrition-related concerns at this time.  Wt Readings from Last 15 Encounters:  03/18/19 124.9 kg  01/24/19 115.8 kg  01/14/19 116.6 kg  12/12/18 122.8 kg  11/26/18 56.2 kg -- suspect this is scale error  11/14/18 129.2 kg  10/26/18 122 kg  08/02/18 121.6 kg  05/27/18 124.3 kg  05/06/18 125.5 kg  03/15/18 121.6 kg  12/28/17 126.1 kg  12/08/17 129.9 kg  10/22/17 121.6 kg  09/30/17 125.2 kg    Body mass index is 37.34 kg/m. Patient meets criteria for obesity class II based on current BMI.   Current diet order is Heart Healthy/Carb Modified with 1500 ml fluid restriction, patient is consuming approximately 100% of meals at this time. Labs and medications reviewed.   No nutrition interventions warranted at this time. If nutrition issues arise, please consult RD.    Gaynell Face, MS, RD, LDN Inpatient Clinical Dietitian Pager: [phone] Weekend/After Hours: [phone]

## 2019-03-18 NOTE — Anesthesia Preprocedure Evaluation (Deleted)
Anesthesia Evaluation    Airway        Dental   Pulmonary shortness of breath, with exertion, at rest, lying and Long-Term Oxygen Therapy, sleep apnea, Continuous Positive Airway Pressure Ventilation and Oxygen sleep apnea , COPD,  COPD inhaler and oxygen dependent, former smoker,           Cardiovascular Exercise Tolerance: Poor hypertension, Pt. on medications and Pt. on home beta blockers + CAD and +CHF  + dysrhythmias Atrial Fibrillation and Supra Ventricular Tachycardia   LVEF 40-45%, diffuse hypokinesis Ischemic CM   Neuro/Psych Diabetic peripheral neuropathy  Neuromuscular disease negative psych ROS   GI/Hepatic negative GI ROS, Neg liver ROS,   Endo/Other  diabetes, Poorly Controlled, Type 2, Insulin DependentMorbid obesityHyperlipidemia   Renal/GU Renal InsufficiencyRenal disease  negative genitourinary   Musculoskeletal  (+) Arthritis , Osteoarthritis,    Abdominal   Peds  Hematology  (+) anemia , Xarelto- last dose   Anesthesia Other Findings   Reproductive/Obstetrics                             Anesthesia Physical Anesthesia Plan  ASA: III  Anesthesia Plan: MAC   Post-op Pain Management:    Induction: Intravenous  PONV Risk Score and Plan: 1 and Propofol infusion, Ondansetron and Treatment may vary due to age or medical condition  Airway Management Planned: Natural Airway and Nasal Cannula  Additional Equipment:   Intra-op Plan:   Post-operative Plan:   Informed Consent:   Plan Discussed with:   Anesthesia Plan Comments:         Anesthesia Quick Evaluation

## 2019-03-18 NOTE — Progress Notes (Signed)
Central Telemetry called to provide update on patient. Patient had a 9-beat run of wide QRS complexes. Patient is resting comfortably with CPAP. Patient easily aroused and shows no signs of distress.  Will continue to closely monitor the patient.

## 2019-03-18 NOTE — Progress Notes (Signed)
EEG Completed; Results Pending  

## 2019-03-19 LAB — CBC
HCT: 30.2 % — ABNORMAL LOW (ref 39.0–52.0)
Hemoglobin: 8.6 g/dL — ABNORMAL LOW (ref 13.0–17.0)
MCH: 25.4 pg — ABNORMAL LOW (ref 26.0–34.0)
MCHC: 28.5 g/dL — ABNORMAL LOW (ref 30.0–36.0)
MCV: 89.1 fL (ref 80.0–100.0)
Platelets: 312 10*3/uL (ref 150–400)
RBC: 3.39 MIL/uL — ABNORMAL LOW (ref 4.22–5.81)
RDW: 24.5 % — ABNORMAL HIGH (ref 11.5–15.5)
WBC: 11.8 10*3/uL — ABNORMAL HIGH (ref 4.0–10.5)
nRBC: 0.4 % — ABNORMAL HIGH (ref 0.0–0.2)

## 2019-03-19 LAB — GLUCOSE, CAPILLARY
Glucose-Capillary: 137 mg/dL — ABNORMAL HIGH (ref 70–99)
Glucose-Capillary: 178 mg/dL — ABNORMAL HIGH (ref 70–99)
Glucose-Capillary: 97 mg/dL (ref 70–99)
Glucose-Capillary: 195 mg/dL — ABNORMAL HIGH (ref 70–99)

## 2019-03-19 LAB — MAGNESIUM: Magnesium: 2.1 mg/dL (ref 1.7–2.4)

## 2019-03-19 LAB — BASIC METABOLIC PANEL
Anion gap: 15 (ref 5–15)
BUN: 72 mg/dL — ABNORMAL HIGH (ref 8–23)
CO2: 35 mmol/L — ABNORMAL HIGH (ref 22–32)
Calcium: 8.7 mg/dL — ABNORMAL LOW (ref 8.9–10.3)
Chloride: 85 mmol/L — ABNORMAL LOW (ref 98–111)
Creatinine, Ser: 1.94 mg/dL — ABNORMAL HIGH (ref 0.61–1.24)
GFR calc Af Amer: 39 mL/min — ABNORMAL LOW (ref >60)
GFR calc non Af Amer: 34 mL/min — ABNORMAL LOW (ref >60)
Glucose, Bld: 343 mg/dL — ABNORMAL HIGH (ref 70–99)
Potassium: 3.8 mmol/L (ref 3.5–5.1)
Sodium: 135 mmol/L (ref 135–145)

## 2019-03-19 MED ORDER — METOLAZONE 5 MG PO TABS
5.0000 mg | ORAL_TABLET | Freq: Every day | ORAL | Status: DC
  Administered 2019-03-19 – 2019-03-20 (×2): 5 mg via ORAL
  Filled 2019-03-19 (×2): qty 1

## 2019-03-19 MED ORDER — GERHARDT'S BUTT CREAM
TOPICAL_CREAM | Freq: Every day | CUTANEOUS | Status: DC
  Administered 2019-03-19 – 2019-03-29 (×11): via TOPICAL
  Administered 2019-03-30 – 2019-03-31 (×2): 1 via TOPICAL
  Filled 2019-03-19: qty 1

## 2019-03-19 MED ORDER — INSULIN NPH (HUMAN) (ISOPHANE) 100 UNIT/ML ~~LOC~~ SUSP
40.0000 [IU] | Freq: Two times a day (BID) | SUBCUTANEOUS | Status: DC
  Administered 2019-03-19 – 2019-03-21 (×5): 40 [IU] via SUBCUTANEOUS

## 2019-03-19 MED ORDER — INSULIN ASPART 100 UNIT/ML ~~LOC~~ SOLN
18.0000 [IU] | Freq: Three times a day (TID) | SUBCUTANEOUS | Status: DC
  Administered 2019-03-19 – 2019-03-21 (×6): 18 [IU] via SUBCUTANEOUS

## 2019-03-19 NOTE — Progress Notes (Signed)
Orthopedic Tech Progress Note Patient Details:  Nicholas Caldwell 1945/12/25 [id number]  Ortho Devices Ortho Device/Splint Location: Bilateral unna boots Ortho Device/Splint Interventions: Application   Post Interventions Patient Tolerated: Well Instructions Provided: Care of device   [name] 03/19/2019, 1:33 PM

## 2019-03-19 NOTE — Progress Notes (Signed)
Attempted to get pt for MRI around 12am but pt was sleeping and on CPAP, RN requested pt get his rest and we try later in the am.

## 2019-03-19 NOTE — Progress Notes (Signed)
Patient ID: Nicholas Caldwell, male   DOB: 06-23-1946, 73 y.o.   MRN: [id number]     Advanced Heart Failure Rounding Note  PCP-Cardiologist: No primary care provider on file.   Subjective:    Milrinone started 5/10 at 0.25.   Remains on lasix gtt at 15 (increased yesterday), daily metolazone and milrinone. CVP down from 19->17. Weight down 6 pounds. Says he is trying to limit fluids. Denies CP or SOB. NO orthopnea or PND. Wants UNNA boots changed.   EEG negative.   Remains in AFL. On Eliquis Hgb stable at 8.6 No obvious bleeding   Objective:   Weight Range: 122.1 kg Body mass index is 36.51 kg/m.   Vital Signs:   Temp:  [97.9 F (36.6 C)-98.7 F (37.1 C)] 98.4 F (36.9 C) (05/16 0805) Pulse Rate:  [79-119] 84 (05/16 0805) Resp:  [1-20] 19 (05/16 0805) BP: (101-112)/(53-62) 112/53 (05/16 0805) SpO2:  [94 %-100 %] 97 % (05/16 0805) Weight:  [122.1 kg] 122.1 kg (05/16 0500) Last BM Date: 03/16/19  Weight change: Filed Weights   03/17/19 0356 03/18/19 0500 03/19/19 0500  Weight: 125.3 kg 124.9 kg 122.1 kg    Intake/Output:   Intake/Output Summary (Last 24 hours) at 03/19/2019 0844 Last data filed at 03/19/2019 0700 Gross per 24 hour  Intake 1862.73 ml  Output 4350 ml  Net -2487.27 ml      Physical Exam    General:  Obese male sitting up in bed . No resp difficulty HEENT: normal Neck: supple.JVP to ear. Carotids 2+ bilat; no bruits. No lymphadenopathy or thryomegaly appreciated. Cor: PMI nondisplaced. Irregular rate & rhythm. 2/6 TR Lungs: clear Abdomen: soft, nontender, + distended. No hepatosplenomegaly. No bruits or masses. Good bowel sounds. Extremities: no cyanosis, clubbing, rash, 2+ edema + UNNA boots Neuro: alert & orientedx3, cranial nerves grossly intact. moves all 4 extremities w/o difficulty. Affect pleasant    Telemetry   Atypical atrial flutter rate in 80s-100s (personally reviewed).   Labs    CBC Recent Labs    03/18/19 0700 03/19/19 0500   WBC 10.5 11.8*  HGB 8.4* 8.6*  HCT 29.3* 30.2*  MCV 87.7 89.1  PLT 310 509   Basic Metabolic Panel Recent Labs    03/18/19 0700 03/19/19 0500  NA 137 135  K 3.2* 3.8  CL 91* 85*  CO2 35* 35*  GLUCOSE 129* 343*  BUN 71* 72*  CREATININE 1.98* 1.94*  CALCIUM 8.1* 8.7*  MG 2.0 2.1   Liver Function Tests No results for input(s): AST, ALT, ALKPHOS, BILITOT, PROT, ALBUMIN in the last 72 hours. No results for input(s): LIPASE, AMYLASE in the last 72 hours. Cardiac Enzymes No results for input(s): CKTOTAL, CKMB, CKMBINDEX, TROPONINI in the last 72 hours.  BNP: BNP (last 3 results) Recent Labs    12/01/18 0015 12/30/18 1621 03/06/19 1829  BNP 1,666.7* 1,273.0* 1,020.1*    ProBNP (last 3 results) No results for input(s): PROBNP in the last 8760 hours.   D-Dimer No results for input(s): DDIMER in the last 72 hours. Hemoglobin A1C No results for input(s): HGBA1C in the last 72 hours. Fasting Lipid Panel No results for input(s): CHOL, HDL, LDLCALC, TRIG, CHOLHDL, LDLDIRECT in the last 72 hours. Thyroid Function Tests No results for input(s): TSH, T4TOTAL, T3FREE, THYROIDAB in the last 72 hours.  Invalid input(s): FREET3  Other results:   Imaging    No results found.   Medications:     Scheduled Medications: . acetaZOLAMIDE  250 mg Oral BID  .  acetylcysteine  2 mL Nebulization BID  . apixaban  5 mg Oral BID  . arformoterol  15 mcg Nebulization BID  . bisoprolol  2.5 mg Oral Daily  . budesonide (PULMICORT) nebulizer solution  0.5 mg Nebulization BID  . docusate sodium  100 mg Oral Daily  . ferrous sulfate  325 mg Oral BID WC  . folic acid  1 mg Oral Daily  . gabapentin  600 mg Oral BID  . guaiFENesin  400 mg Oral BID  . insulin aspart  0-20 Units Subcutaneous TID WC  . insulin aspart  0-5 Units Subcutaneous QHS  . insulin aspart  18 Units Subcutaneous TID WC  . insulin NPH Human  40 Units Subcutaneous BID AC & HS  . ipratropium-albuterol  3 mL  Nebulization TID  . isosorbide-hydrALAZINE  1 tablet Oral TID  . latanoprost  1 drop Both Eyes QHS  . mouth rinse  15 mL Mouth Rinse BID  . nicotine  7 mg Transdermal Daily  . pantoprazole  40 mg Oral BID  . polyethylene glycol  17 g Oral Daily  . potassium chloride  60 mEq Oral QID  . predniSONE  10 mg Oral Q breakfast  . rosuvastatin  20 mg Oral q1800  . sodium chloride flush  10-40 mL Intracatheter Q12H  . sodium chloride flush  3 mL Intravenous Q12H  . spironolactone  25 mg Oral Daily    Infusions: . sodium chloride    . furosemide (LASIX) infusion 15 mg/hr (03/18/19 2242)  . milrinone 0.25 mcg/kg/min (03/19/19 0602)    PRN Medications: sodium chloride, acetaminophen, diazepam, fluticasone, hydrocortisone cream, ondansetron (ZOFRAN) IV, oxyCODONE-acetaminophen, sodium chloride flush, sodium chloride flush   Assessment/Plan   1. Acute on chronic systolic CHF: Nonischemic cardiomyopathy.  With prominent RV failure, likely related to COPD/OSA/OHS. TEE in 3/20 with EF 40-45%, severely dilated RV with moderately decreased systolic function. He has had multiple recent admissions with CHF and COPD exacerbations.  He has not cooperated with paramedicine or [facility] home [facility] as outpatient, has not been using CPAP per his wife, suspect significant medication noncompliance as well though he denies.   - Milrinone started 5/9. Now on 0.25 co-ox pending but has been in the 70% range - He remains on lasix drip with marked volume overload. CVP 21-> 18 -> 15 -> 16 -> 18 -> 19 -> 17 - Lasix cut back to 10 mg/hr due to AKI. Now back to 15/hr. Continue metolazone and diamox. We seem to be making progress today. Previously CVP was down to 10 on RHC earlier this year but we may not be able to get back that low in setting of AFL.  - Continue UNNA boots - will ask to replace today - Continue bisoprolol 2.5 daily.  - Continue Bidil 1 tab tid.  - Continue spironolactone 25 mg daily.    - Cardiomems  may be helpful as outpatient.  2. Anemia: Baseline hgb around 8.  6.7 at admission, denies overt GI bleeding.  He has had 2 unit PRBCs and IV Fe.  Hgb 7.9 today, lower but no overt bleeding.  Colonoscopy 5/8 unremarkable but EGD showed necrotic mucosa gastric fundus/body that is concerning for gastric cancer, biopsies taken.  - Hgb stable - Gastric biopsy negative for malignancy. Repeat endoscopy 5/12 showed inflammatory and friable gastric mucosa and a non bleeding ulcer.   - Tolerating Eliquis - Has received IV iron per primary team and GI 3. Atrial fibrillation/flutter: Patient has history of paroxysmal atrial  fibrillation.  This admission, he appears to be in an atypical atrial flutter.  Rate is currently controlled. Xarelto on hold with GI bleeding.  Was seen in past by Dr. Rayann Heman, not thought to be good ablation candidate.  He has not been on Tikosyn or amiodarone due to history of long QT.  - Would like to do TEE-guided DCCV -  Have rescheduled for Monday once volume status improved - Will eventually ask EP to reconsider ablation given lack of good anti-arrhythmic options. He seems to do better in NSR.  4. COPD: Severe, on 3L home oxygen.  He was wheezing initially but now much improved.  This may be due to pulmonary edema, but also consider component of COPD exacerbation.  - Getting Duonebs.  - He is now on prednisone and doxycycline 100 mg bid.   - Per primary team  5. OHS/OSA: On home oxygen.  Per wife, has not been using CPAP at home.  Will give CPAP at night here.  6. ID: Suspect this is primarily a CHF exacerbation.  Afebrile and WBCs not significantly elevated.  1 blood culture positive for what appears to be coagulase negative Staph (probably contaminant).  COVID-19 negative.  7. CAD: Nonobstructive on prior cath.  No s/s ischemia. He is on Crestor. Off ASA with DOAC and GI bleed  8. Acute on chronic kidney failure III - Baseline seems to be 1.3-1.6 - Creatinine back down today.  1.9-> 2.2 -> 2.1 -> 2.0-> 1.98 -> 1.9. Plan as above 9. Hypokalemia - will supp  Length of Stay: 13  Glori Bickers, MD  03/19/2019, 8:44 AM  Advanced Heart Failure Team Pager [phone] (M-F; 7a - 4p)  Please contact [facility] for night-coverage after hours (4p -7a ) and weekends on [email or web address]

## 2019-03-19 NOTE — Progress Notes (Signed)
PROGRESS NOTE    Nicholas Caldwell  YOV:[id number] DOB: 1946-01-23 DOA: 03/06/2019 PCP: [facility]   Brief Narrative:   Nicholas Caldwell is a 73 y.o. M with CAD, isch CM EF 40%, sdCHF, pAF on Xarelto, pHTN, MO, OSA not compliant with CPAP, COPD on home O2 3L, and smoking who presented with swelling and dyspnea.  Patient had had slow progressive dyspnea, SOB with exertion, also significant peripheral edema.  In ER, BNP >1000, CXR with edema, HR 124 in Afib, and new anemia Hgb 6.7 g/dL. Transfused and started on Lasix.   Assessment & Plan:   Principal Problem:   Acute on chronic respiratory failure with hypoxia and hypercapnia (HCC) Active Problems:   Essential hypertension   Type 2 diabetes mellitus with neuropathy   PAF (paroxysmal atrial fibrillation) (HCC)   COPD (chronic obstructive pulmonary disease) (HCC)   Medically noncompliant   Atrial fibrillation with RVR (HCC)   AKI (acute kidney injury) ([place])   Acute on chronic anemia   Acute on chronic systolic (congestive) heart failure (HCC)   Acute on chronic systolic CHF (congestive heart failure) (HCC)   Iron deficiency anemia   NSVT (nonsustained ventricular tachycardia) (HCC)   Tobacco abuse counseling   Gastritis and gastroduodenitis   Acute versus chronic CVA versus TIA Overnight, patient was apparently confused and nonverbal for a few seconds.  Patient states he was angry at nurses and was just not communicating.  Rapid response was initiated with a CT head notable for small bilateral cerebellar infarcts, question chronic versus acute.  Neurology was consulted overnight with recommendations of MR brain and EEG. --Neurology following, appreciate assistance --Patient appears to be at his normal baseline today --Lipid panel 5/2 w/ TC 83, LDL 18, HDL 43, TG 109 --HbA1c 6.07 Nov 2018 --EEG with no seizure activity --MR brain pending --continue crestor --will defer asa rec's to neurology given recent GIB/ABLA and now on  NOAC --continue to monitor on telemetry --Continue home gabapentin  Acute on chronic hypoxic respiratory failure: resolved Patient presenting from home with progressive dyspnea associated with worsening lower extremity edema.  Likely multifactorial from acute CHF exacerbation and COPD flare, as well as anemia, NSVT/AF.  Chest x-ray on admission notable for bilateral interstitial and alveolar airspace opacities with cardiomegaly. --Now titrated down to home O2 at 3 L per nasal cannula --Continue supportive care  Acute on chronic systolic and diastolic CHF EF 74-12% on TEE in Mar.   --Cardiology following, appreciate assistance --Net negative 1.7L yesterday; net negative 23L since admission --Milrinone, Acetazolamide and Lasix gtt per Cardiology/HF team --metolazone  --Continue spironolactone --Continue BiDIL --Continue Crestor --Continue BB --Cardiac, low-salt diet, fluid restrict to 1200 mL's per day  Atrial fibrillation/flutter Continues with persistent A. fib/flutter on telemetry.  Cardiology following, appreciate assistance.  Has been discussed with EP, not a optimal ablation candidate, may consider in the future. --Started on heparin drip on 03/15/2019; transition to Eliquis 5/14, Hgb stable 8.4 --continue beta-blocker --Continue to monitor on telemetry --Cardiology plans TEE/DCCV now on monday  Acute kidney injury Creatinine baseline 1.2 for the last 6 months, roughly, trending up from 0.9 last few years.  AKI again this hospitalization, has been increasing last few days with diuresis. --Cr 2.25-->2.10-->2.06-->1.98-->1.94 --continue Lasix drip per cardiology, now at 6m/hr --continues on acetazolamide and milrinone --Daily BMP  Iron deficiency anemia Acute blood loss anemia Upper GI bleed Suspected chronic GI blood loss from gastric ulcer.  Received 2 units PRBCs during the hospitalization. EGD 5/12 with multiple nonbleeding  erosions, one nonbleeding superficial gastric  ulcer with clean ulcer base --Pathology from stomach biopsy negative for metaplasia, dysplasia or malignancy, no H. pylori --s/p Feraheme 5/4, 5/12, and repeat in one week (5/19) --Continue protonix 64m BID x 12 weeks --Minimize NSAID use --Hgb 8.6 today, stable --GI recommends outpatient follow-up and consideration of gastric mapping in the future as an outpatient; will need follow-up with Dr. CBryan Lemma  Acute COPD exacerbation Patient with home O2 dependence at 3 L per nasal cannula. --Continue prednisone taper --Doxycycline 100 mg p.o. twice daily, plan 7-day course --Pulmicort/Brovana --Continue nebs --Chest PT, Mucinex, Mucomyst prn --Continue to titrate supplemental oxygen to maintain SPO2 greater than 88%  Positive blood culture CoNS in 1/2.  Likely contaminant.  Hypokalemia -K 3.2 today, potassium increased to 60 mg 3 times daily; hypokalemic exacerbated by Lasix drip --Repeat electrolytes with magnesium in the a.m.  Diabetes Hemoglobin A1c 6.5 on 11/13/2018.  Home regimen includes metformin 1000 mg p.o. twice daily, NPH 70-30 5u BID. Glucose elevated to 343 this AM, likely exacerbated by steroid use and diet non-compliance --increase NPH from 35u to 40u SQ BID --increase Novolog from 12u to 18u TIDAC --high dose Insulin sliding scale for further coverage --Hopeful glucose will improve as steroids being tapered off, counseled on need to reduce carbs  Hypertension Coronary disease secondary prevention -Continue Crestor, BB  Paroxysmal atrial fibrillation NSVT CHA2DS2-Vasc 4 (Age, CHF, PVD, DM).   -continue anticoagulation with Eliquis -Continue BB  OHS/OSA/MO --Noncompliant with CPAP at home per report, but has been compliant while inpatient   DVT prophylaxis:  Heparin drip Code Status: FULL CODE Family Communication: none Disposition Plan: inpatient, plan TEE/DCCV on Monday   Consultants:   Cardiology  Procedures:   EGD  03/15/2019  Antimicrobials:   Doxycycline   Subjective: Patient seen and examined at bedside, resting comfortably in bed.  Currently getting a neb treatment.  Reports a good night overall.  Good sleep.  EEG was unremarkable.  Awaiting MRI brain today.  Cardiology plans cardioversion on Monday with TEE.  Continues on Lasix and milrinone drip.  Continues to have good diuresis.  On his home oxygen requirement.  No other complaints at this time.  Breathing improved.  Continues with good diuresis.  Denies headache, no chest pain, no shortness of breath from his normal baseline, no palpitations, no abdominal pain, no weakness, no cough/congestion, no fever/chills/night sweats, no paresthesias.  No other acute events overnight per nursing staff.  Objective: Vitals:   03/19/19 0801 03/19/19 0803 03/19/19 0805 03/19/19 1118  BP:   (!) 112/53 (!) 201/144  Pulse:   84 99  Resp:   19 18  Temp:   98.4 F (36.9 C) 98.5 F (36.9 C)  TempSrc:   Oral Oral  SpO2: 99% 99% 97% 90%  Weight:      Height:        Intake/Output Summary (Last 24 hours) at 03/19/2019 1138 Last data filed at 03/19/2019 0700 Gross per 24 hour  Intake 1742.73 ml  Output 4350 ml  Net -2607.27 ml   Filed Weights   03/17/19 0356 03/18/19 0500 03/19/19 0500  Weight: 125.3 kg 124.9 kg 122.1 kg    Examination:  General exam: Appears calm and comfortable  Respiratory system: Breath sounds slightly decreased bilateral bases with crackles, normal respiratory effort on 3 L nasal cannula Cardiovascular system: S1 & S2 heard, irregularly irregular rhythm, normal rate. No JVD, murmurs, rubs, gallops or clicks.  2+ pedal edema bilaterally Gastrointestinal system: Abdomen is  nondistended, soft and nontender. No organomegaly or masses felt. Normal bowel sounds heard. Central nervous system: Alert and oriented. No focal neurological deficits. Extremities: Symmetric 5 x 5 power. Skin: No rashes, lesions or ulcers Psychiatry: Judgement  and insight appear normal. Mood & affect appropriate.     Data Reviewed: I have personally reviewed following labs and imaging studies  CBC: Recent Labs  Lab 03/13/19 0416 03/15/19 0416 03/16/19 0430 03/17/19 0520 03/18/19 0700 03/19/19 0500  WBC 7.1 11.6* 9.9 10.4 10.5 11.8*  NEUTROABS 6.1  --   --   --   --   --   HGB 7.9* 7.9* 8.1* 7.8* 8.4* 8.6*  HCT 26.5* 26.3* 27.7* 26.5* 29.3* 30.2*  MCV 84.7 85.1 84.5 85.8 87.7 89.1  PLT 379 308 308 281 310 947   Basic Metabolic Panel: Recent Labs  Lab 03/14/19 0734  03/15/19 0416 03/16/19 0430 03/17/19 0520 03/18/19 0700 03/19/19 0500  NA  --    < > 132* 134* 137 137 135  K  --    < > 3.3* 3.4* 3.2* 3.2* 3.8  CL  --    < > 82* 83* 85* 91* 85*  CO2  --    < > 37* 40* 40* 35* 35*  GLUCOSE  --    < > 258* 291* 205* 129* 343*  BUN  --    < > 75* 75* 75* 71* 72*  CREATININE  --    < > 2.25* 2.10* 2.06* 1.98* 1.94*  CALCIUM  --    < > 8.5* 8.5* 8.5* 8.1* 8.7*  MG 1.9  --   --   --  2.0 2.0 2.1   < > = values in this interval not displayed.   GFR: Estimated Creatinine Clearance: 47.8 mL/min (A) (by C-G formula based on SCr of 1.94 mg/dL (H)). Liver Function Tests: No results for input(s): AST, ALT, ALKPHOS, BILITOT, PROT, ALBUMIN in the last 168 hours. No results for input(s): LIPASE, AMYLASE in the last 168 hours. No results for input(s): AMMONIA in the last 168 hours. Coagulation Profile: No results for input(s): INR, PROTIME in the last 168 hours. Cardiac Enzymes: No results for input(s): CKTOTAL, CKMB, CKMBINDEX, TROPONINI in the last 168 hours. BNP (last 3 results) No results for input(s): PROBNP in the last 8760 hours. HbA1C: No results for input(s): HGBA1C in the last 72 hours. CBG: Recent Labs  Lab 03/18/19 1157 03/18/19 1607 03/18/19 2148 03/19/19 0638 03/19/19 1118  GLUCAP 267* 360* 145* 137* 178*   Lipid Profile: No results for input(s): CHOL, HDL, LDLCALC, TRIG, CHOLHDL, LDLDIRECT in the last 72  hours. Thyroid Function Tests: No results for input(s): TSH, T4TOTAL, FREET4, T3FREE, THYROIDAB in the last 72 hours. Anemia Panel: No results for input(s): VITAMINB12, FOLATE, FERRITIN, TIBC, IRON, RETICCTPCT in the last 72 hours. Sepsis Labs: No results for input(s): PROCALCITON, LATICACIDVEN in the last 168 hours.  No results found for this or any previous visit (from the past 240 hour(s)).       Radiology Studies: Ct Head Wo Contrast  Result Date: 03/18/2019 CLINICAL DATA:  73 year old male with seizure-like activity, altered mental status. EXAM: CT HEAD WITHOUT CONTRAST TECHNIQUE: Contiguous axial images were obtained from the base of the skull through the vertex without intravenous contrast. COMPARISON:  None. FINDINGS: Brain: Linear hypodensities in both cerebellar hemispheres most compatible with small infarcts, the larger is on the right, and appears more age indeterminate. The smaller area on the left appears chronic. Comparatively mild cerebral  white matter hypodensity in both hemispheres. No cortical encephalomalacia identified. Deep gray matter nuclei are within normal limits for age. No midline shift, ventriculomegaly, mass effect, evidence of mass lesion, intracranial hemorrhage or evidence of cortically based acute infarction. Cavum septum pellucidum, normal variant. Vascular: Calcified atherosclerosis at the skull base. No suspicious intracranial vascular hyperdensity. Skull: No acute osseous abnormality identified. Sinuses/Orbits: Paranasal sinuses and mastoids are well pneumatized. Other: Visualized orbits and scalp soft tissues are within normal limits. IMPRESSION: 1. Small bilateral cerebellar infarcts, probably chronic although that on the right is age indeterminate. 2. Largely normal for age noncontrast CT appearance of the cerebral hemispheres and no other acute intracranial abnormality. Electronically Signed   By: Genevie Ann M.D.   On: 03/18/2019 05:45        Scheduled  Meds:  acetaZOLAMIDE  250 mg Oral BID   acetylcysteine  2 mL Nebulization BID   apixaban  5 mg Oral BID   arformoterol  15 mcg Nebulization BID   bisoprolol  2.5 mg Oral Daily   budesonide (PULMICORT) nebulizer solution  0.5 mg Nebulization BID   docusate sodium  100 mg Oral Daily   ferrous sulfate  325 mg Oral BID WC   folic acid  1 mg Oral Daily   gabapentin  600 mg Oral BID   guaiFENesin  400 mg Oral BID   insulin aspart  0-20 Units Subcutaneous TID WC   insulin aspart  0-5 Units Subcutaneous QHS   insulin aspart  18 Units Subcutaneous TID WC   insulin NPH Human  40 Units Subcutaneous BID AC & HS   ipratropium-albuterol  3 mL Nebulization TID   isosorbide-hydrALAZINE  1 tablet Oral TID   latanoprost  1 drop Both Eyes QHS   mouth rinse  15 mL Mouth Rinse BID   metolazone  5 mg Oral Daily   nicotine  7 mg Transdermal Daily   pantoprazole  40 mg Oral BID   polyethylene glycol  17 g Oral Daily   potassium chloride  60 mEq Oral QID   predniSONE  10 mg Oral Q breakfast   rosuvastatin  20 mg Oral q1800   sodium chloride flush  10-40 mL Intracatheter Q12H   sodium chloride flush  3 mL Intravenous Q12H   spironolactone  25 mg Oral Daily   Continuous Infusions:  sodium chloride     furosemide (LASIX) infusion 15 mg/hr (03/18/19 2242)   milrinone 0.25 mcg/kg/min (03/19/19 0602)     LOS: 13 days    Time spent: 38 minutes    Camella Seim J [place] DO [facility] Pager [phone]  If 7PM-7AM, please contact night-coverage [email or web address] Password [facility] 03/19/2019, 11:38 AM

## 2019-03-19 NOTE — Plan of Care (Signed)
  Problem: Education: Goal: Knowledge of General Education information will improve Description Including pain rating scale, medication(s)/side effects and non-pharmacologic comfort measures Outcome: Progressing   Problem: Health Behavior/Discharge Planning: Goal: Ability to manage health-related needs will improve Outcome: Progressing   Problem: Clinical Measurements: Goal: Ability to maintain clinical measurements within normal limits will improve Outcome: Progressing Goal: Will remain free from infection Outcome: Progressing Goal: Diagnostic test results will improve Outcome: Progressing Goal: Respiratory complications will improve Outcome: Progressing Goal: Cardiovascular complication will be avoided Outcome: Progressing   Problem: Activity: Goal: Risk for activity intolerance will decrease Outcome: Progressing   Problem: Nutrition: Goal: Adequate nutrition will be maintained Outcome: Progressing   Problem: Coping: Goal: Level of anxiety will decrease Outcome: Progressing   Problem: Elimination: Goal: Will not experience complications related to bowel motility Outcome: Progressing Goal: Will not experience complications related to urinary retention Outcome: Progressing   Problem: Pain Managment: Goal: General experience of comfort will improve Outcome: Progressing   Problem: Safety: Goal: Ability to remain free from injury will improve Outcome: Progressing   Problem: Skin Integrity: Goal: Risk for impaired skin integrity will decrease Outcome: Progressing   Problem: Education: Goal: Ability to demonstrate management of disease process will improve Outcome: Progressing Goal: Ability to verbalize understanding of medication therapies will improve Outcome: Progressing Goal: Individualized Educational Video(s) Outcome: Progressing   Problem: Activity: Goal: Capacity to carry out activities will improve Outcome: Progressing   Problem: Cardiac: Goal:  Ability to achieve and maintain adequate cardiopulmonary perfusion will improve Outcome: Progressing

## 2019-03-19 NOTE — Progress Notes (Signed)
Second attempt to get pt for MRI tonight, RN stated they are short staffed and cannot come down with the pt and rapid response left _0 . We will try again in the morning.

## 2019-03-19 NOTE — Progress Notes (Signed)
NEUROLOGY Progress Note  Reason for Consult: neurologic opinion of 3-4 min period of unresponsiveness Referring Physician: Dr [place]  CC: no complaint today per pt  Interval history:  Pt is pleasant today and resting comfortably. He appoligizes for his anger yesterday. No further events of unresponsiveness. Awaiting MRI brain. EEG did not show seizures activity.   Allergies No Known Allergies  Home Medications Medications Prior to Admission  Medication Sig Dispense Refill  . albuterol (VENTOLIN HFA) 108 (90 Base) MCG/ACT inhaler Inhale 2 puffs into the lungs every 6 (six) hours as needed for wheezing or shortness of breath.    . bisoprolol (ZEBETA) 5 MG tablet Take 0.5 tablets (2.5 mg total) by mouth daily for 30 days. 15 tablet 3  . budesonide-formoterol (SYMBICORT) 160-4.5 MCG/ACT inhaler Inhale 2 puffs into the lungs 2 (two) times daily.    . diazepam (VALIUM) 5 MG tablet Take 5 mg by mouth daily as needed for anxiety.    . fluticasone (FLONASE) 50 MCG/ACT nasal spray Place 2 sprays into both nostrils daily as needed for allergies.     Marland Kitchen gabapentin (NEURONTIN) 600 MG tablet Take 1 tablet (600 mg total) by mouth 2 (two) times daily. 60 tablet 0  . guaifenesin (HUMIBID E) 400 MG TABS tablet Take 1 tablet (400 mg total) by mouth every 6 (six) hours as needed. (Patient taking differently: Take 400 mg by mouth every 6 (six) hours as needed (cough). ) 10 tablet 0  . hydrocortisone (ANUSOL-HC) 2.5 % rectal cream Place 1 application rectally 2 (two) times daily as needed for hemorrhoids or itching.     . hydrocortisone 1 % lotion Apply 1 application topically See admin instructions. Apply small amount to back twice daily for itchy rash    . insulin aspart protamine - aspart (NOVOLOG MIX 70/30 FLEXPEN) (70-30) 100 UNIT/ML FlexPen Inject 0.05 mLs (5 Units total) into the skin 2 (two) times daily. (Patient taking differently: Inject 30 Units into the skin 2 (two) times daily. ) 15 mL 11  .  ipratropium-albuterol (DUONEB) 0.5-2.5 (3) MG/3ML SOLN Take 3 mLs by nebulization 2 (two) times daily as needed (shortness of breath/wheezing).     . isosorbide-hydrALAZINE (BIDIL) 20-37.5 MG tablet Take 1 tablet by mouth 3 (three) times daily. (Patient taking differently: Take 1 tablet by mouth 2 (two) times a day. ) 90 tablet 3  . latanoprost (XALATAN) 0.005 % ophthalmic solution Place 1 drop into both eyes at bedtime.    . magnesium oxide (MAG-OX) 400 MG tablet Take 400 mg by mouth daily.    . metFORMIN (GLUCOPHAGE-XR) 500 MG 24 hr tablet Take 1,000 mg by mouth 2 (two) times daily with a meal.    . metolazone (ZAROXOLYN) 2.5 MG tablet Take 1 tablet (2.5 mg total) by mouth every Wednesday. Take extra Potassium 44mq (40 total, daily) when taking this medication 10 tablet 3  . oxyCODONE-acetaminophen (PERCOCET) 10-325 MG tablet Take 1 tablet by mouth every 6 (six) hours.    . OXYGEN Inhale 3 L into the lungs continuous.     . pantoprazole (PROTONIX) 40 MG tablet Take 1 tablet (40 mg total) by mouth 2 (two) times daily. 60 tablet 0  . polyethylene glycol (MIRALAX / GLYCOLAX) packet Take 17 g by mouth daily. (Patient taking differently: Take 17 g by mouth daily as needed (constipation). ) 14 each 0  . potassium chloride SA (K-DUR) 20 MEQ tablet Take 40 mEq by mouth See admin instructions. Take 2 tablets (40 meq) by mouth  every morning, take an additional tablet (20 meq) at night if taking metolazone that day.    . rivaroxaban (XARELTO) 20 MG TABS tablet Take 1 tablet (20 mg total) by mouth daily with supper. (Patient taking differently: Take 20 mg by mouth at bedtime. ) 30 tablet 4  . rosuvastatin (CRESTOR) 40 MG tablet Take 20 mg by mouth daily.    . sodium chloride (OCEAN) 0.65 % SOLN nasal spray Place 2 sprays into both nostrils as needed for congestion.     . torsemide (DEMADEX) 20 MG tablet Take 4 tablets (80 mg total) by mouth 2 (two) times daily for 30 days. 240 tablet 0  . acetaminophen  (TYLENOL) 160 MG/5ML solution Take 20.3 mLs (650 mg total) by mouth every 6 (six) hours as needed for mild pain, headache or fever. (Patient not taking: Reported on 03/06/2019) 120 mL 0  . diclofenac sodium (VOLTAREN) 1 % GEL Apply 2 g topically 4 (four) times daily. (Patient not taking: Reported on 03/07/2019) 1 Tube 1  . Magnesium Oxide 420 (252 Mg) MG TABS Take 420 mg by mouth 2 (two) times daily. (Patient not taking: Reported on 03/06/2019) 60 tablet 0  . Multiple Vitamin (MULTIVITAMIN WITH MINERALS) TABS tablet Take 1 tablet by mouth daily. (Patient not taking: Reported on 03/06/2019)    . potassium chloride SA (K-DUR,KLOR-CON) 20 MEQ tablet Take 2 tablets (40 mEq total) by mouth every morning AND 1 tablet (20 mEq total) at bedtime. (Patient not taking: Reported on 03/06/2019) 90 tablet 0  . rosuvastatin (CRESTOR) 20 MG tablet Take 1 tablet (20 mg total) by mouth daily. (Patient not taking: Reported on 03/06/2019) 30 tablet 0    Hospital Medications . acetaZOLAMIDE  250 mg Oral BID  . acetylcysteine  2 mL Nebulization BID  . apixaban  5 mg Oral BID  . arformoterol  15 mcg Nebulization BID  . bisoprolol  2.5 mg Oral Daily  . budesonide (PULMICORT) nebulizer solution  0.5 mg Nebulization BID  . docusate sodium  100 mg Oral Daily  . ferrous sulfate  325 mg Oral BID WC  . folic acid  1 mg Oral Daily  . gabapentin  600 mg Oral BID  . guaiFENesin  400 mg Oral BID  . insulin aspart  0-20 Units Subcutaneous TID WC  . insulin aspart  0-5 Units Subcutaneous QHS  . insulin aspart  18 Units Subcutaneous TID WC  . insulin NPH Human  40 Units Subcutaneous BID AC & HS  . ipratropium-albuterol  3 mL Nebulization TID  . isosorbide-hydrALAZINE  1 tablet Oral TID  . latanoprost  1 drop Both Eyes QHS  . mouth rinse  15 mL Mouth Rinse BID  . nicotine  7 mg Transdermal Daily  . pantoprazole  40 mg Oral BID  . polyethylene glycol  17 g Oral Daily  . potassium chloride  60 mEq Oral QID  . predniSONE  10 mg Oral Q  breakfast  . rosuvastatin  20 mg Oral q1800  . sodium chloride flush  10-40 mL Intracatheter Q12H  . sodium chloride flush  3 mL Intravenous Q12H  . spironolactone  25 mg Oral Daily     ROS: Reviewed all systems, no complaints per pt   Physical Examination:  Vitals:   03/19/19 0758 03/19/19 0801 03/19/19 0803 03/19/19 0805  BP:    (!) 112/53  Pulse:    84  Resp:    19  Temp:    98.4 F (36.9 C)  TempSrc:  Oral  SpO2: 100% 99% 99% 97%  Weight:      Height:        General - obese, no acute distress Psych- pleasant affect today Heart - irreg;no murmer Lungs - Clear to auscultation Abdomen - Soft - non tender Extremities - Bilat LEs wrapped/boots placed; unable to assess distal pulses. There is 1-2+ edema Skin - Warm and dry  Neurologic Examination:  Mental Status:  Alert &Ox4. Appropriate in conversation. Speech without evidence of dysarthria or aphasia. Able to follow 3 step commands without difficulty.  Cranial Nerves:  II-bilateral visual fields intact III/IV/VI-Pupils were equal and reacted. Extraocular movements were full.  V/VII-no facial numbness and no facial weakness.  VIII-hearing normal.  X-normal speech and symmetrical palatal movement.  XII-midline tongue extension  Motor: There is gen weakness in bilat LEs d/t edema; no focal deficit Right : Upper extremity   5/5    Left:     Upper extremity   5/5  Lower extremity   4+/5    Lower extremity   4+/5 Tone and bulk:normal tone throughout; no atrophy noted Sensory: Intact to light touch in all extremities. Deep Tendon Reflexes: 1+/4 throughout Plantars: unable d/t boot/dressings Cerebellar: Normal finger to nose; heel to shin bilaterally is limited d/t edema and bilat LE dressings/wrap to knees and gen weakness to preform/complete task. Gait: not tested  LABORATORY STUDIES:  Basic Metabolic Panel: Recent Labs  Lab 03/14/19 0734  03/15/19 0416 03/16/19 0430 03/17/19 0520 03/18/19 0700  03/19/19 0500  NA  --    < > 132* 134* 137 137 135  K  --    < > 3.3* 3.4* 3.2* 3.2* 3.8  CL  --    < > 82* 83* 85* 91* 85*  CO2  --    < > 37* 40* 40* 35* 35*  GLUCOSE  --    < > 258* 291* 205* 129* 343*  BUN  --    < > 75* 75* 75* 71* 72*  CREATININE  --    < > 2.25* 2.10* 2.06* 1.98* 1.94*  CALCIUM  --    < > 8.5* 8.5* 8.5* 8.1* 8.7*  MG 1.9  --   --   --  2.0 2.0 2.1   < > = values in this interval not displayed.    Liver Function Tests: No results for input(s): AST, ALT, ALKPHOS, BILITOT, PROT, ALBUMIN in the last 168 hours. No results for input(s): LIPASE, AMYLASE in the last 168 hours. No results for input(s): AMMONIA in the last 168 hours.  CBC: Recent Labs  Lab 03/13/19 0416 03/15/19 0416 03/16/19 0430 03/17/19 0520 03/18/19 0700 03/19/19 0500  WBC 7.1 11.6* 9.9 10.4 10.5 11.8*  NEUTROABS 6.1  --   --   --   --   --   HGB 7.9* 7.9* 8.1* 7.8* 8.4* 8.6*  HCT 26.5* 26.3* 27.7* 26.5* 29.3* 30.2*  MCV 84.7 85.1 84.5 85.8 87.7 89.1  PLT 379 308 308 281 310 312    Cardiac Enzymes: No results for input(s): CKTOTAL, CKMB, CKMBINDEX, TROPONINI in the last 168 hours.  BNP: Invalid input(s): POCBNP  CBG: Recent Labs  Lab 03/18/19 0625 03/18/19 1157 03/18/19 1607 03/18/19 2148 03/19/19 0638  GLUCAP 170* 267* 360* 145* 137*    Microbiology:   Coagulation Studies: No results for input(s): LABPROT, INR in the last 72 hours.  Urinalysis: No results for input(s): COLORURINE, LABSPEC, PHURINE, GLUCOSEU, HGBUR, BILIRUBINUR, KETONESUR, PROTEINUR, UROBILINOGEN, NITRITE, LEUKOCYTESUR in the last  168 hours.  Invalid input(s): APPERANCEUR  Lipid Panel:     Component Value Date/Time   CHOL 83 03/08/2019 0412   TRIG 109 03/08/2019 0412   HDL 43 03/08/2019 0412   CHOLHDL 1.9 03/08/2019 0412   VLDL 22 03/08/2019 0412   LDLCALC 18 03/08/2019 0412    HgbA1C:  Lab Results  Component Value Date   HGBA1C 6.5 (H) 11/13/2018    Urine Drug Screen:      Component  Value Date/Time   LABOPIA POSITIVE (A) 12/30/2018 1759   COCAINSCRNUR NONE DETECTED 12/30/2018 1759   LABBENZ POSITIVE (A) 12/30/2018 1759   AMPHETMU NONE DETECTED 12/30/2018 1759   THCU NONE DETECTED 12/30/2018 1759   LABBARB NONE DETECTED 12/30/2018 1759     Alcohol Level:  No results for input(s): ETH in the last 168 hours.  Miscellaneous labs:  EKG  EKG  IMAGING: Ct Head Wo Contrast  Result Date: 03/18/2019 CLINICAL DATA:  73 year old male with seizure-like activity, altered mental status. EXAM: CT HEAD WITHOUT CONTRAST TECHNIQUE: Contiguous axial images were obtained from the base of the skull through the vertex without intravenous contrast. COMPARISON:  None. FINDINGS: Brain: Linear hypodensities in both cerebellar hemispheres most compatible with small infarcts, the larger is on the right, and appears more age indeterminate. The smaller area on the left appears chronic. Comparatively mild cerebral white matter hypodensity in both hemispheres. No cortical encephalomalacia identified. Deep gray matter nuclei are within normal limits for age. No midline shift, ventriculomegaly, mass effect, evidence of mass lesion, intracranial hemorrhage or evidence of cortically based acute infarction. Cavum septum pellucidum, normal variant. Vascular: Calcified atherosclerosis at the skull base. No suspicious intracranial vascular hyperdensity. Skull: No acute osseous abnormality identified. Sinuses/Orbits: Paranasal sinuses and mastoids are well pneumatized. Other: Visualized orbits and scalp soft tissues are within normal limits. IMPRESSION: 1. Small bilateral cerebellar infarcts, probably chronic although that on the right is age indeterminate. 2. Largely normal for age noncontrast CT appearance of the cerebral hemispheres and no other acute intracranial abnormality. Electronically Signed   By: Genevie Ann M.D.   On: 03/18/2019 05:45   EEG Abnormalities: None Clinical Interpretation: This normal EEG is  recorded in the waking and drowsy state. There was no seizure or seizure predisposition recorded on this study. Please note that lack of epileptiform activity on EEG does not preclude the possibility of epilepsy.   Assessment/Plan: This is a 73 yr old man PMH of Afib on Xarelto, COPD, HFpEF, DM2, HLD, HTN, obesity,and remote seizure history.   # Seizure disorder- detailed history since 31s post [place] combat. This could be r/t  post concussive/percussive in combat setting. Also must consider CTE. EEG reassuring.  # Unresponsive state- pt had 3-4 min spell upon awakening 5/15. EEG to evaluate. Ddx: seizures, orthostatic hypotension upon standing # Abnormal CTH- appears to have chronic bilat cerebellar infarcts. ddx CTE, stroke. MRI to better evaluate  Plan: MRI brain still pending, we will f/u  Seizure precautions; no driving Continue Gabapentin 661m BID for now   Obera Stauch Metzger-Cihelka, ARNP-C, ANVP-BC [facility] [phone] If 7pm- 7am, please page neurology on call as listed in [facility]

## 2019-03-20 ENCOUNTER — Inpatient Hospital Stay (HOSPITAL_COMMUNITY): Payer: Medicare Other

## 2019-03-20 LAB — CBC
HCT: 26.2 % — ABNORMAL LOW (ref 39.0–52.0)
HCT: 29.3 % — ABNORMAL LOW (ref 39.0–52.0)
Hemoglobin: 7.4 g/dL — ABNORMAL LOW (ref 13.0–17.0)
Hemoglobin: 8.5 g/dL — ABNORMAL LOW (ref 13.0–17.0)
MCH: 25.4 pg — ABNORMAL LOW (ref 26.0–34.0)
MCH: 25.8 pg — ABNORMAL LOW (ref 26.0–34.0)
MCHC: 28.2 g/dL — ABNORMAL LOW (ref 30.0–36.0)
MCHC: 29 g/dL — ABNORMAL LOW (ref 30.0–36.0)
MCV: 90 fL (ref 80.0–100.0)
MCV: 89.1 fL (ref 80.0–100.0)
Platelets: 289 10*3/uL (ref 150–400)
Platelets: 344 10*3/uL (ref 150–400)
RBC: 2.91 MIL/uL — ABNORMAL LOW (ref 4.22–5.81)
RBC: 3.29 MIL/uL — ABNORMAL LOW (ref 4.22–5.81)
RDW: 25.3 % — ABNORMAL HIGH (ref 11.5–15.5)
RDW: 25.2 % — ABNORMAL HIGH (ref 11.5–15.5)
WBC: 9.4 10*3/uL (ref 4.0–10.5)
WBC: 11.5 10*3/uL — ABNORMAL HIGH (ref 4.0–10.5)
nRBC: 0.4 % — ABNORMAL HIGH (ref 0.0–0.2)
nRBC: 0.3 % — ABNORMAL HIGH (ref 0.0–0.2)

## 2019-03-20 LAB — GLUCOSE, CAPILLARY
Glucose-Capillary: 105 mg/dL — ABNORMAL HIGH (ref 70–99)
Glucose-Capillary: 87 mg/dL (ref 70–99)
Glucose-Capillary: 194 mg/dL — ABNORMAL HIGH (ref 70–99)
Glucose-Capillary: 303 mg/dL — ABNORMAL HIGH (ref 70–99)
Glucose-Capillary: 80 mg/dL (ref 70–99)
Glucose-Capillary: 117 mg/dL — ABNORMAL HIGH (ref 70–99)

## 2019-03-20 LAB — BASIC METABOLIC PANEL
Anion gap: 12 (ref 5–15)
Anion gap: 12 (ref 5–15)
BUN: 69 mg/dL — ABNORMAL HIGH (ref 8–23)
BUN: 80 mg/dL — ABNORMAL HIGH (ref 8–23)
CO2: 32 mmol/L (ref 22–32)
CO2: 34 mmol/L — ABNORMAL HIGH (ref 22–32)
Calcium: 7.7 mg/dL — ABNORMAL LOW (ref 8.9–10.3)
Calcium: 8.5 mg/dL — ABNORMAL LOW (ref 8.9–10.3)
Chloride: 80 mmol/L — ABNORMAL LOW (ref 98–111)
Chloride: 89 mmol/L — ABNORMAL LOW (ref 98–111)
Creatinine, Ser: 1.88 mg/dL — ABNORMAL HIGH (ref 0.61–1.24)
Creatinine, Ser: 2.11 mg/dL — ABNORMAL HIGH (ref 0.61–1.24)
GFR calc Af Amer: 41 mL/min — ABNORMAL LOW (ref >60)
GFR calc Af Amer: 36 mL/min — ABNORMAL LOW (ref >60)
GFR calc non Af Amer: 35 mL/min — ABNORMAL LOW (ref >60)
GFR calc non Af Amer: 31 mL/min — ABNORMAL LOW (ref >60)
Glucose, Bld: 571 mg/dL (ref 70–99)
Glucose, Bld: 233 mg/dL — ABNORMAL HIGH (ref 70–99)
Potassium: 4 mmol/L (ref 3.5–5.1)
Potassium: 4.4 mmol/L (ref 3.5–5.1)
Sodium: 124 mmol/L — ABNORMAL LOW (ref 135–145)
Sodium: 135 mmol/L (ref 135–145)

## 2019-03-20 LAB — COOXEMETRY PANEL
Carboxyhemoglobin: 2.1 % — ABNORMAL HIGH (ref 0.5–1.5)
Methemoglobin: 1.9 % — ABNORMAL HIGH (ref 0.0–1.5)
O2 Saturation: 82.9 %
Total hemoglobin: 7.5 g/dL — ABNORMAL LOW (ref 12.0–16.0)

## 2019-03-20 LAB — TYPE AND SCREEN
ABO/RH(D): B POS
Antibody Screen: NEGATIVE

## 2019-03-20 LAB — MAGNESIUM: Magnesium: 1.9 mg/dL (ref 1.7–2.4)

## 2019-03-20 LAB — GLUCOSE, RANDOM: Glucose, Bld: 113 mg/dL — ABNORMAL HIGH (ref 70–99)

## 2019-03-20 MED ORDER — POTASSIUM CHLORIDE CRYS ER 20 MEQ PO TBCR
60.0000 meq | EXTENDED_RELEASE_TABLET | Freq: Three times a day (TID) | ORAL | Status: DC
  Administered 2019-03-20 (×2): 60 meq via ORAL
  Filled 2019-03-20 (×2): qty 3

## 2019-03-20 MED ORDER — ALUM & MAG HYDROXIDE-SIMETH 200-200-20 MG/5ML PO SUSP
15.0000 mL | Freq: Four times a day (QID) | ORAL | Status: DC | PRN
  Administered 2019-03-20 – 2019-03-30 (×5): 15 mL via ORAL
  Filled 2019-03-20 (×7): qty 30

## 2019-03-20 NOTE — Progress Notes (Signed)
PROGRESS NOTE    Nicholas Caldwell  GBT:[id number] DOB: 1946/11/01 DOA: 03/06/2019 PCP: [facility]   Brief Narrative:   [name] is a 73 y.o. M with CAD, isch CM EF 40%, sdCHF, pAF on Xarelto, pHTN, MO, OSA not compliant with CPAP, COPD on home O2 3L, and smoking who presented with swelling and dyspnea.  Patient had had slow progressive dyspnea, SOB with exertion, also significant peripheral edema.  In ER, BNP >1000, CXR with edema, HR 124 in Afib, and new anemia Hgb 6.7 g/dL. Transfused and started on Lasix.   Assessment & Plan:   Principal Problem:   Acute on chronic respiratory failure with hypoxia and hypercapnia (HCC) Active Problems:   Essential hypertension   Type 2 diabetes mellitus with neuropathy   PAF (paroxysmal atrial fibrillation) (HCC)   COPD (chronic obstructive pulmonary disease) (HCC)   Medically noncompliant   Atrial fibrillation with RVR (HCC)   AKI (acute kidney injury) ([place])   Acute on chronic anemia   Acute on chronic systolic (congestive) heart failure (HCC)   Acute on chronic systolic CHF (congestive heart failure) (HCC)   Iron deficiency anemia   NSVT (nonsustained ventricular tachycardia) (HCC)   Tobacco abuse counseling   Gastritis and gastroduodenitis   Acute versus chronic CVA versus TIA Overnight, patient was apparently confused and nonverbal for a few seconds.  Patient states he was angry at nurses and was just not communicating.  Rapid response was initiated with a CT head notable for small bilateral cerebellar infarcts, question chronic versus acute.  Neurology was consulted overnight with recommendations of MR brain and EEG. --Neurology following, appreciate assistance --Patient appears to be at his normal baseline today --Lipid panel 5/2 w/ TC 83, LDL 18, HDL 43, TG 109 --HbA1c 6.07 Nov 2018 --EEG with no seizure activity --MR brain pending --continue crestor --will defer asa rec's to neurology given recent GIB/ABLA and now on  NOAC --continue to monitor on telemetry --Continue home gabapentin  Acute on chronic hypoxic respiratory failure: resolved Patient presenting from home with progressive dyspnea associated with worsening lower extremity edema.  Likely multifactorial from acute CHF exacerbation and COPD flare, as well as anemia, NSVT/AF.  Chest x-ray on admission notable for bilateral interstitial and alveolar airspace opacities with cardiomegaly. --Now titrated down to home O2 at 3 L per nasal cannula --Continue supportive care  Acute on chronic systolic and diastolic CHF EF 71-06% on TEE in Mar.   --Cardiology following, appreciate assistance --Net negative 547m yesterday; net negative 26.1L since admission --Milrinone, Acetazolamide and Lasix gtt per Cardiology/HF team --metolazone  --Continue spironolactone --Continue BiDIL --Continue Crestor --Continue BB --Cardiac, low-salt diet, fluid restrict to 1200 mL's per day  Atrial fibrillation/atypical aflutter Continues with persistent A. fib/flutter on telemetry.  Cardiology following, appreciate assistance.  Has been discussed with EP, not a optimal ablation candidate, may consider in the future. --Started on heparin drip on 03/15/2019; transitioned to Eliquis 5/14 --Hgb trending down 8.4-->7.4 today, no signs of bleeding  --continue beta-blocker --Continue to monitor on telemetry --Cardiology plans TEE/DCCV tomorrow, n.p.o. after midnight  Acute kidney injury Creatinine baseline 1.2 for the last 6 months, roughly, trending up from 0.9 last few years.  AKI again this hospitalization, has been increasing last few days with diuresis. --Cr 2.25-->2.10-->2.06-->1.98-->1.94-->1.88 --continue Lasix drip per cardiology, now at 164mhr --continues on acetazolamide, milrinone, metolazone --Daily BMP  Iron deficiency anemia Acute blood loss anemia Upper GI bleed Suspected chronic GI blood loss from gastric ulcer.  Received 2 units  PRBCs during the  hospitalization. EGD 5/12 with multiple nonbleeding erosions, one nonbleeding superficial gastric ulcer with clean ulcer base --Pathology from stomach biopsy negative for metaplasia, dysplasia or malignancy, no H. pylori --s/p Feraheme 5/4, 5/12, and repeat in one week (5/19) --Continue protonix 70m BID x 12 weeks --Minimize NSAID use --Hgb slightly trending down 8.6-->8.4-->7.4, no signs of bleeding --Continue to monitor daily CBC, especially now on Eliquis --GI recommends outpatient follow-up and consideration of gastric mapping in the future as an outpatient; will need follow-up with Dr. CBryan Lemma  Acute COPD exacerbation Patient with home O2 dependence at 3 L per nasal cannula. --Continue prednisone taper --Doxycycline 100 mg p.o. twice daily, plan 7-day course --Pulmicort/Brovana --Continue nebs --Chest PT, Mucinex, Mucomyst prn --Continue to titrate supplemental oxygen to maintain SPO2 greater than 88%  Positive blood culture CoNS in 1/2.  Likely contaminant.  Hypokalemia -K 3.2 today, potassium increased to 60 mg 3 times daily; hypokalemic exacerbated by Lasix drip --Repeat electrolytes with magnesium in the a.m.  Diabetes Hemoglobin A1c 6.5 on 11/13/2018.  Home regimen includes metformin 1000 mg p.o. twice daily, NPH 70-30 5u BID. Glucose elevated to 343 this AM, likely exacerbated by steroid use and diet non-compliance --increase NPH from 35u to 40u SQ BID --increase Novolog from 12u to 18u TIDAC --high dose Insulin sliding scale for further coverage --Hopeful glucose will improve as steroids being tapered off, counseled on need to reduce carbs  Hypertension Coronary disease secondary prevention -Continue Crestor, BB  Paroxysmal atrial fibrillation NSVT CHA2DS2-Vasc 4 (Age, CHF, PVD, DM).   -continue anticoagulation with Eliquis -Continue BB  OHS/OSA/MO --Noncompliant with CPAP at home per report, but has been compliant while inpatient   DVT prophylaxis:   Heparin drip Code Status: FULL CODE Family Communication: none Disposition Plan: inpatient, plan TEE/DCCV on Monday   Consultants:   Cardiology  Procedures:   EGD 03/15/2019  Antimicrobials:   Doxycycline   Subjective: Patient seen and examined at bedside, resting comfortably in bed.  Reports good rest overnight. States he is trying to limit his fluid intake.,  Although only net -562 mL's past 24 hours and a 6 pound weight gain.  Lab this morning show a glucose of 571, with fingerstick glucose check shortly thereafter of 89, very inconsistent and repeat labs were ordered.  Continues on Lasix and milrinone drip.  Also on Acetazolamide and metolazone.  Cardiology plans TEE/cardioversion tomorrow for atypical flutter. No other complaints at this time.  Breathing improved.  Continues with good diuresis.  Denies headache, no chest pain, no shortness of breath from his normal baseline, no palpitations, no abdominal pain, no weakness, no cough/congestion, no fever/chills/night sweats, no paresthesias.  No other acute events overnight per nursing staff.  Objective: Vitals:   03/20/19 0700 03/20/19 0736 03/20/19 0739 03/20/19 0744  BP: (!) 108/52     Pulse: (!) 58     Resp:      Temp: 97.8 F (36.6 C)     TempSrc: Oral     SpO2: 93% 95% 96% 98%  Weight:      Height:        Intake/Output Summary (Last 24 hours) at 03/20/2019 1026 Last data filed at 03/20/2019 0935 Gross per 24 hour  Intake 1724 ml  Output 3400 ml  Net -1676 ml   Filed Weights   03/18/19 0500 03/19/19 0500 03/20/19 0300  Weight: 124.9 kg 122.1 kg 125.1 kg    Examination:  General exam: Appears calm and comfortable  Respiratory system: Breath  sounds slightly decreased bilateral bases with crackles, normal respiratory effort on 3 L nasal cannula Cardiovascular system: S1 & S2 heard, irregularly irregular rhythm, normal rate. No JVD, murmurs, rubs, gallops or clicks.  2+ pedal edema bilaterally Gastrointestinal  system: Abdomen is nondistended, soft and nontender. No organomegaly or masses felt. Normal bowel sounds heard. Central nervous system: Alert and oriented. No focal neurological deficits. Extremities: Symmetric 5 x 5 power. Skin: No rashes, lesions or ulcers Psychiatry: Judgement and insight appear normal. Mood & affect appropriate.     Data Reviewed: I have personally reviewed following labs and imaging studies  CBC: Recent Labs  Lab 03/16/19 0430 03/17/19 0520 03/18/19 0700 03/19/19 0500 03/20/19 0500  WBC 9.9 10.4 10.5 11.8* 9.4  HGB 8.1* 7.8* 8.4* 8.6* 7.4*  HCT 27.7* 26.5* 29.3* 30.2* 26.2*  MCV 84.5 85.8 87.7 89.1 90.0  PLT 308 281 310 312 629   Basic Metabolic Panel: Recent Labs  Lab 03/14/19 0734  03/16/19 0430 03/17/19 0520 03/18/19 0700 03/19/19 0500 03/20/19 0500 03/20/19 0739  NA  --    < > 134* 137 137 135 124*  --   K  --    < > 3.4* 3.2* 3.2* 3.8 4.0  --   CL  --    < > 83* 85* 91* 85* 80*  --   CO2  --    < > 40* 40* 35* 35* 32  --   GLUCOSE  --    < > 291* 205* 129* 343* 571* 113*  BUN  --    < > 75* 75* 71* 72* 69*  --   CREATININE  --    < > 2.10* 2.06* 1.98* 1.94* 1.88*  --   CALCIUM  --    < > 8.5* 8.5* 8.1* 8.7* 7.7*  --   MG 1.9  --   --  2.0 2.0 2.1 1.9  --    < > = values in this interval not displayed.   GFR: Estimated Creatinine Clearance: 50 mL/min (A) (by C-G formula based on SCr of 1.88 mg/dL (H)). Liver Function Tests: No results for input(s): AST, ALT, ALKPHOS, BILITOT, PROT, ALBUMIN in the last 168 hours. No results for input(s): LIPASE, AMYLASE in the last 168 hours. No results for input(s): AMMONIA in the last 168 hours. Coagulation Profile: No results for input(s): INR, PROTIME in the last 168 hours. Cardiac Enzymes: No results for input(s): CKTOTAL, CKMB, CKMBINDEX, TROPONINI in the last 168 hours. BNP (last 3 results) No results for input(s): PROBNP in the last 8760 hours. HbA1C: No results for input(s): HGBA1C in the  last 72 hours. CBG: Recent Labs  Lab 03/19/19 1520 03/19/19 2151 03/20/19 0633 03/20/19 0640 03/20/19 0825  GLUCAP 97 195* 105* 87 194*   Lipid Profile: No results for input(s): CHOL, HDL, LDLCALC, TRIG, CHOLHDL, LDLDIRECT in the last 72 hours. Thyroid Function Tests: No results for input(s): TSH, T4TOTAL, FREET4, T3FREE, THYROIDAB in the last 72 hours. Anemia Panel: No results for input(s): VITAMINB12, FOLATE, FERRITIN, TIBC, IRON, RETICCTPCT in the last 72 hours. Sepsis Labs: No results for input(s): PROCALCITON, LATICACIDVEN in the last 168 hours.  No results found for this or any previous visit (from the past 240 hour(s)).       Radiology Studies: No results found.      Scheduled Meds: . acetaZOLAMIDE  250 mg Oral BID  . acetylcysteine  2 mL Nebulization BID  . apixaban  5 mg Oral BID  . arformoterol  15  mcg Nebulization BID  . bisoprolol  2.5 mg Oral Daily  . budesonide (PULMICORT) nebulizer solution  0.5 mg Nebulization BID  . docusate sodium  100 mg Oral Daily  . ferrous sulfate  325 mg Oral BID WC  . folic acid  1 mg Oral Daily  . gabapentin  600 mg Oral BID  . Gerhardt's butt cream   Topical Daily  . guaiFENesin  400 mg Oral BID  . insulin aspart  0-20 Units Subcutaneous TID WC  . insulin aspart  0-5 Units Subcutaneous QHS  . insulin aspart  18 Units Subcutaneous TID WC  . insulin NPH Human  40 Units Subcutaneous BID AC & HS  . ipratropium-albuterol  3 mL Nebulization TID  . isosorbide-hydrALAZINE  1 tablet Oral TID  . latanoprost  1 drop Both Eyes QHS  . mouth rinse  15 mL Mouth Rinse BID  . metolazone  5 mg Oral Daily  . nicotine  7 mg Transdermal Daily  . pantoprazole  40 mg Oral BID  . polyethylene glycol  17 g Oral Daily  . potassium chloride  60 mEq Oral QID  . rosuvastatin  20 mg Oral q1800  . sodium chloride flush  10-40 mL Intracatheter Q12H  . sodium chloride flush  3 mL Intravenous Q12H  . spironolactone  25 mg Oral Daily    Continuous Infusions: . sodium chloride    . furosemide (LASIX) infusion 15 mg/hr (03/19/19 1630)  . milrinone 0.25 mcg/kg/min (03/20/19 0420)     LOS: 14 days    Time spent: 38 minutes    Vaani Morren J [place] DO [facility] Pager [phone]  If 7PM-7AM, please contact night-coverage [email or web address] Password [facility] 03/20/2019, 10:26 AM

## 2019-03-20 NOTE — Progress Notes (Addendum)
Patient ID: Nicholas Caldwell, male   DOB: Sep 11, 1946, 73 y.o.   MRN: [id number]     Advanced Heart Failure Rounding Note  PCP-Cardiologist: No primary care provider on file.   Subjective:    Milrinone started 5/10 at 0.25.   Remains on lasix gtt at 15, diamox, daily metolazone and milrinone. CVP down from 19->17. Weight reported as up 6 pounds. Says he is trying to limit fluids.   CBG 571 this am. [facility] contacted. Apparently it was artifactual.   Denies CP, SOB, orthopnea or PND.  Back in NSR overnight. On Eliquis Hgb down 8.6->7.4 No obvious bleeding   Objective:   Weight Range: 125.1 kg Body mass index is 37.4 kg/m.   Vital Signs:   Temp:  [97.5 F (36.4 C)-98.5 F (36.9 C)] 97.8 F (36.6 C) (05/17 0700) Pulse Rate:  [58-99] 58 (05/17 0700) Resp:  [18-22] 20 (05/17 0300) BP: (86-201)/(52-144) 108/52 (05/17 0700) SpO2:  [90 %-100 %] 98 % (05/17 0744) Weight:  [125.1 kg] 125.1 kg (05/17 0300) Last BM Date: 03/16/19  Weight change: Filed Weights   03/18/19 0500 03/19/19 0500 03/20/19 0300  Weight: 124.9 kg 122.1 kg 125.1 kg    Intake/Output:   Intake/Output Summary (Last 24 hours) at 03/20/2019 0959 Last data filed at 03/20/2019 0935 Gross per 24 hour  Intake 1924 ml  Output 3400 ml  Net -1476 ml      Physical Exam    General:  Obese male sitting up in chair  No resp difficulty HEENT: normal Neck: supple. JVP 15. Carotids 2+ bilat; no bruits. No lymphadenopathy or thryomegaly appreciated. Cor: PMI nondisplaced. Tachy irregular . 2/6 TR. Lungs: clear Abdomen: obese soft, nontender, + distended. No hepatosplenomegaly. No bruits or masses. Good bowel sounds. Extremities: no cyanosis, clubbing, rash, 2+ edema + unna boots Neuro: alert & orientedx3, cranial nerves grossly intact. moves all 4 extremities w/o difficulty. Affect pleasant   Telemetry   NSR with frequent PACs in 80s (personally reviewed).   Labs    CBC Recent Labs    03/19/19 0500 03/20/19 0500   WBC 11.8* 9.4  HGB 8.6* 7.4*  HCT 30.2* 26.2*  MCV 89.1 90.0  PLT 312 644   Basic Metabolic Panel Recent Labs    03/19/19 0500 03/20/19 0500 03/20/19 0739  NA 135 124*  --   K 3.8 4.0  --   CL 85* 80*  --   CO2 35* 32  --   GLUCOSE 343* 571* 113*  BUN 72* 69*  --   CREATININE 1.94* 1.88*  --   CALCIUM 8.7* 7.7*  --   MG 2.1 1.9  --    Liver Function Tests No results for input(s): AST, ALT, ALKPHOS, BILITOT, PROT, ALBUMIN in the last 72 hours. No results for input(s): LIPASE, AMYLASE in the last 72 hours. Cardiac Enzymes No results for input(s): CKTOTAL, CKMB, CKMBINDEX, TROPONINI in the last 72 hours.  BNP: BNP (last 3 results) Recent Labs    12/01/18 0015 12/30/18 1621 03/06/19 1829  BNP 1,666.7* 1,273.0* 1,020.1*    ProBNP (last 3 results) No results for input(s): PROBNP in the last 8760 hours.   D-Dimer No results for input(s): DDIMER in the last 72 hours. Hemoglobin A1C No results for input(s): HGBA1C in the last 72 hours. Fasting Lipid Panel No results for input(s): CHOL, HDL, LDLCALC, TRIG, CHOLHDL, LDLDIRECT in the last 72 hours. Thyroid Function Tests No results for input(s): TSH, T4TOTAL, T3FREE, THYROIDAB in the last 72 hours.  Invalid  input(s): FREET3  Other results:   Imaging    No results found.   Medications:     Scheduled Medications: . acetaZOLAMIDE  250 mg Oral BID  . acetylcysteine  2 mL Nebulization BID  . apixaban  5 mg Oral BID  . arformoterol  15 mcg Nebulization BID  . bisoprolol  2.5 mg Oral Daily  . budesonide (PULMICORT) nebulizer solution  0.5 mg Nebulization BID  . docusate sodium  100 mg Oral Daily  . ferrous sulfate  325 mg Oral BID WC  . folic acid  1 mg Oral Daily  . gabapentin  600 mg Oral BID  . Gerhardt's butt cream   Topical Daily  . guaiFENesin  400 mg Oral BID  . insulin aspart  0-20 Units Subcutaneous TID WC  . insulin aspart  0-5 Units Subcutaneous QHS  . insulin aspart  18 Units Subcutaneous TID  WC  . insulin NPH Human  40 Units Subcutaneous BID AC & HS  . ipratropium-albuterol  3 mL Nebulization TID  . isosorbide-hydrALAZINE  1 tablet Oral TID  . latanoprost  1 drop Both Eyes QHS  . mouth rinse  15 mL Mouth Rinse BID  . metolazone  5 mg Oral Daily  . nicotine  7 mg Transdermal Daily  . pantoprazole  40 mg Oral BID  . polyethylene glycol  17 g Oral Daily  . potassium chloride  60 mEq Oral QID  . rosuvastatin  20 mg Oral q1800  . sodium chloride flush  10-40 mL Intracatheter Q12H  . sodium chloride flush  3 mL Intravenous Q12H  . spironolactone  25 mg Oral Daily    Infusions: . sodium chloride    . furosemide (LASIX) infusion 15 mg/hr (03/19/19 1630)  . milrinone 0.25 mcg/kg/min (03/20/19 0420)    PRN Medications: sodium chloride, acetaminophen, diazepam, fluticasone, hydrocortisone cream, ondansetron (ZOFRAN) IV, oxyCODONE-acetaminophen, sodium chloride flush, sodium chloride flush   Assessment/Plan   1. Acute on chronic systolic CHF: Nonischemic cardiomyopathy.  With prominent RV failure, likely related to COPD/OSA/OHS. TEE in 3/20 with EF 40-45%, severely dilated RV with moderately decreased systolic function. He has had multiple recent admissions with CHF and COPD exacerbations.  He has not cooperated with paramedicine or [facility] home health as outpatient, has not been using CPAP per his wife, suspect significant medication noncompliance as well though he denies.   - Milrinone started 5/9. Now on 0.25 co-ox pending but has been in the 70% range - He remains on lasix drip with marked volume overload. CVP 21-> 18 -> 15 -> 16 -> 18 -> 19 -> 17 - On IV lasix at 15, metolazone and diamox. CVP now down to 15. (Getting back in NSR seems to have helped) Patient denies drinking excess fluid. Previously CVP was down to 10 on RHC earlier this year but we may not be able to get back that low in setting of AFL.  - Continue IV lasix, diamox and metoalzone - Continue UNNA boots -  Continue bisoprolol 2.5 daily.  - Continue Bidil 1 tab tid.  - Continue spironolactone 25 mg daily.    - Cardiomems may be helpful as outpatient.  2. Anemia: Baseline hgb around 8.  6.7 at admission, denies overt GI bleeding.  He has had 2 unit PRBCs and IV Fe.  Hgb 7.4 today, lower but no overt bleeding.  Colonoscopy 5/8 unremarkable but EGD showed necrotic mucosa gastric fundus/body that is concerning for gastric cancer, biopsies taken which were negative for malignancy. Marland Kitchen  -  Hgb down slightly today. May be related to hyperglycemia. Will recheck and get T&S - Continue Eliquis - Has received IV iron per primary team and GI 3. Atrial fibrillation/flutter: Patient has history of paroxysmal atrial fibrillation.  This admission, he appears to be in an atypical atrial flutter.  Rate is currently controlled. Xarelto on hold with GI bleeding.  Was seen in past by Dr. Rayann Heman, not thought to be good ablation candidate.  He has not been on Tikosyn or amiodarone due to history of long QT.  - Converted back to NSR on 5/16 - Will eventually ask EP to reconsider ablation given lack of good anti-arrhythmic options. He seems to do better in NSR.  4. COPD: Severe, on 3L home oxygen.  He was wheezing initially but now much improved.  This may be due to pulmonary edema, but also consider component of COPD exacerbation.  - Getting Duonebs.  - He is now on prednisone and doxycycline 100 mg bid.   - Per primary team  5. OHS/OSA: On home oxygen.  Per wife, has not been using CPAP at home.  Will give CPAP at night here.  6. ID: Suspect this is primarily a CHF exacerbation.  Afebrile and WBCs not significantly elevated.  1 blood culture positive for what appears to be coagulase negative Staph (probably contaminant).  COVID-19 negative.  7. CAD: Nonobstructive on prior cath.  No s/s ischemia. He is on Crestor. Off ASA with DOAC and GI bleed  8. Acute on chronic kidney failure III - Baseline seems to be 1.3-1.6 -  Creatinine back down today. 1.9-> 2.2 -> 2.1 -> 2.0-> 1.98 -> 1.9 -> 1.88. Plan as above 9. Hypokalemia - will supp 10. Hyponatremia - Na 124 in setting of glucose 571. Like pseudohyponatremia. Corrects to ~130 - Will recheck BMET. If still < 125. Consider tolvaptan 11. DM2 with poor control - TRH managing   Length of Stay: [place], [place]  03/20/2019, 9:59 AM  Advanced Heart Failure Team Pager [phone] (M-F; 7a - 4p)  Please contact [facility] for night-coverage after hours (4p -7a ) and weekends on [email or web address]

## 2019-03-20 NOTE — Progress Notes (Signed)
CRITICAL VALUE ALERT  Critical Value:  571  Date & Time Notied:  03/20/19 0600  Provider Notified: Dr. Jennet Maduro  Orders Received/Actions taken: Waiting on orders.

## 2019-03-21 ENCOUNTER — Encounter: Payer: Self-pay | Admitting: Gastroenterology

## 2019-03-21 ENCOUNTER — Inpatient Hospital Stay (HOSPITAL_COMMUNITY): Payer: Medicare Other

## 2019-03-21 ENCOUNTER — Encounter (HOSPITAL_COMMUNITY): Payer: Self-pay | Admitting: Anesthesiology

## 2019-03-21 ENCOUNTER — Encounter (HOSPITAL_COMMUNITY)
Admission: EM | Disposition: A | Discharge status: Home Health | Payer: Self-pay | Source: Home / Self Care | Attending: Internal Medicine

## 2019-03-21 DIAGNOSIS — G40909 Epilepsy, unspecified, not intractable, without status epilepticus: Secondary | ICD-10-CM

## 2019-03-21 DIAGNOSIS — I639 Cerebral infarction, unspecified: Secondary | ICD-10-CM

## 2019-03-21 DIAGNOSIS — I63412 Cerebral infarction due to embolism of left middle cerebral artery: Secondary | ICD-10-CM

## 2019-03-21 DIAGNOSIS — I6340 Cerebral infarction due to embolism of unspecified cerebral artery: Secondary | ICD-10-CM

## 2019-03-21 LAB — BASIC METABOLIC PANEL
Anion gap: 12 (ref 5–15)
BUN: 92 mg/dL — ABNORMAL HIGH (ref 8–23)
CO2: 36 mmol/L — ABNORMAL HIGH (ref 22–32)
Calcium: 8.6 mg/dL — ABNORMAL LOW (ref 8.9–10.3)
Chloride: 89 mmol/L — ABNORMAL LOW (ref 98–111)
Creatinine, Ser: 2.61 mg/dL — ABNORMAL HIGH (ref 0.61–1.24)
GFR calc Af Amer: 28 mL/min — ABNORMAL LOW (ref >60)
GFR calc non Af Amer: 24 mL/min — ABNORMAL LOW (ref >60)
Glucose, Bld: 158 mg/dL — ABNORMAL HIGH (ref 70–99)
Potassium: 4.8 mmol/L (ref 3.5–5.1)
Sodium: 137 mmol/L (ref 135–145)

## 2019-03-21 LAB — GLUCOSE, CAPILLARY
Glucose-Capillary: 146 mg/dL — ABNORMAL HIGH (ref 70–99)
Glucose-Capillary: 183 mg/dL — ABNORMAL HIGH (ref 70–99)
Glucose-Capillary: 231 mg/dL — ABNORMAL HIGH (ref 70–99)
Glucose-Capillary: 240 mg/dL — ABNORMAL HIGH (ref 70–99)
Glucose-Capillary: 234 mg/dL — ABNORMAL HIGH (ref 70–99)

## 2019-03-21 LAB — CBC
HCT: 28.9 % — ABNORMAL LOW (ref 39.0–52.0)
Hemoglobin: 8.2 g/dL — ABNORMAL LOW (ref 13.0–17.0)
MCH: 25.4 pg — ABNORMAL LOW (ref 26.0–34.0)
MCHC: 28.4 g/dL — ABNORMAL LOW (ref 30.0–36.0)
MCV: 89.5 fL (ref 80.0–100.0)
Platelets: 334 10*3/uL (ref 150–400)
RBC: 3.23 MIL/uL — ABNORMAL LOW (ref 4.22–5.81)
RDW: 25 % — ABNORMAL HIGH (ref 11.5–15.5)
WBC: 10.9 10*3/uL — ABNORMAL HIGH (ref 4.0–10.5)
nRBC: 0 % (ref 0.0–0.2)

## 2019-03-21 LAB — COOXEMETRY PANEL
Carboxyhemoglobin: 2.3 % — ABNORMAL HIGH (ref 0.5–1.5)
Methemoglobin: 1.3 % (ref 0.0–1.5)
O2 Saturation: 81.5 %
Total hemoglobin: 8.2 g/dL — ABNORMAL LOW (ref 12.0–16.0)

## 2019-03-21 LAB — MAGNESIUM: Magnesium: 2.4 mg/dL (ref 1.7–2.4)

## 2019-03-21 SURGERY — CANCELLED PROCEDURE

## 2019-03-21 MED ORDER — TORSEMIDE 20 MG PO TABS
80.0000 mg | ORAL_TABLET | Freq: Two times a day (BID) | ORAL | Status: DC
  Administered 2019-03-21 (×2): 80 mg via ORAL
  Filled 2019-03-21 (×3): qty 4

## 2019-03-21 MED ORDER — SODIUM CHLORIDE 0.9 % IV SOLN: 510.0000 mg | Freq: Once | INTRAVENOUS | Status: DC

## 2019-03-21 MED ORDER — ACETYLCYSTEINE 20 % IN SOLN: 2.0000 mL | Freq: Two times a day (BID) | RESPIRATORY_TRACT | Status: DC | PRN

## 2019-03-21 MED ORDER — POTASSIUM CHLORIDE CRYS ER 20 MEQ PO TBCR
40.0000 meq | EXTENDED_RELEASE_TABLET | Freq: Two times a day (BID) | ORAL | Status: DC
  Administered 2019-03-21 – 2019-03-23 (×6): 40 meq via ORAL
  Filled 2019-03-21 (×7): qty 2

## 2019-03-21 MED ORDER — STROKE: EARLY STAGES OF RECOVERY BOOK
Freq: Once | Status: AC
  Administered 2019-03-21: 16:00:00
  Filled 2019-03-21: qty 1

## 2019-03-21 NOTE — Plan of Care (Signed)
  Problem: Education: Goal: Knowledge of General Education information will improve Description Including pain rating scale, medication(s)/side effects and non-pharmacologic comfort measures Outcome: Progressing   Problem: Clinical Measurements: Goal: Will remain free from infection Outcome: Progressing Goal: Respiratory complications will improve Outcome: Progressing Goal: Cardiovascular complication will be avoided Outcome: Progressing   Problem: Nutrition: Goal: Adequate nutrition will be maintained Outcome: Progressing   Problem: Coping: Goal: Level of anxiety will decrease Outcome: Progressing   Problem: Elimination: Goal: Will not experience complications related to bowel motility Outcome: Progressing Goal: Will not experience complications related to urinary retention Outcome: Progressing   Problem: Pain Managment: Goal: General experience of comfort will improve Outcome: Progressing   Problem: Safety: Goal: Ability to remain free from injury will improve Outcome: Progressing   Problem: Skin Integrity: Goal: Risk for impaired skin integrity will decrease Outcome: Progressing   Problem: Education: Goal: Ability to demonstrate management of disease process will improve Outcome: Progressing Goal: Ability to verbalize understanding of medication therapies will improve Outcome: Progressing Goal: Individualized Educational Video(s) Outcome: Progressing   Problem: Activity: Goal: Capacity to carry out activities will improve Outcome: Progressing   Problem: Cardiac: Goal: Ability to achieve and maintain adequate cardiopulmonary perfusion will improve Outcome: Progressing

## 2019-03-21 NOTE — Care Management Important Message (Signed)
Important Message  Patient Details  Name: Nicholas Caldwell MRN: [id number] Date of Birth: February 20, 1946   Medicare Important Message Given:  Yes    [name] 03/21/2019, 2:46 PM

## 2019-03-21 NOTE — Progress Notes (Signed)
STROKE TEAM PROGRESS NOTE   INTERVAL HISTORY Pt lying in bed, eating chips from one of his plastic bags. He is awake alert and fully orientated. His BP still on the low side and but neuro intact.   Vitals:   03/21/19 1119 03/21/19 1121 03/21/19 1136 03/21/19 1200  BP: (!) 88/51 (!) 104/47 100/67 (!) 91/48  Pulse:   73   Resp:   18   Temp:      TempSrc:      SpO2: 95% 93% 97% 98%  Weight:      Height:        CBC:  Recent Labs  Lab 03/20/19 1037 03/21/19 0500  WBC 11.5* 10.9*  HGB 8.5* 8.2*  HCT 29.3* 28.9*  MCV 89.1 89.5  PLT 344 458    Basic Metabolic Panel:  Recent Labs  Lab 03/20/19 0500  03/20/19 1037 03/21/19 0500  NA 124*  --  135 137  K 4.0  --  4.4 4.8  CL 80*  --  89* 89*  CO2 32  --  34* 36*  GLUCOSE 571*   < > 233* 158*  BUN 69*  --  80* 92*  CREATININE 1.88*  --  2.11* 2.61*  CALCIUM 7.7*  --  8.5* 8.6*  MG 1.9  --   --  2.4   < > = values in this interval not displayed.   Lipid Panel:     Component Value Date/Time   CHOL 83 03/08/2019 0412   TRIG 109 03/08/2019 0412   HDL 43 03/08/2019 0412   CHOLHDL 1.9 03/08/2019 0412   VLDL 22 03/08/2019 0412   LDLCALC 18 03/08/2019 0412   HgbA1c:  Lab Results  Component Value Date   HGBA1C 6.5 (H) 11/13/2018   Urine Drug Screen:     Component Value Date/Time   LABOPIA POSITIVE (A) 12/30/2018 1759   COCAINSCRNUR NONE DETECTED 12/30/2018 1759   LABBENZ POSITIVE (A) 12/30/2018 1759   AMPHETMU NONE DETECTED 12/30/2018 1759   THCU NONE DETECTED 12/30/2018 1759   LABBARB NONE DETECTED 12/30/2018 1759    Alcohol Level     Component Value Date/Time   ETH <5 10/30/2016 0244    IMAGING Mr Brain Wo Contrast  Result Date: 03/20/2019 CLINICAL DATA:  73 y/o M; altered level of consciousness, unexplained. Abnormal CT head. EXAM: MRI HEAD WITHOUT CONTRAST TECHNIQUE: Multiplanar, multiecho pulse sequences of the brain and surrounding structures were obtained without intravenous contrast. COMPARISON:   03/18/2019 CT head FINDINGS: Brain: Small group of subcentimeter foci of reduced diffusion within left posterior frontal lobe subcortical white matter compatible with acute/early subacute infarction. No associated hemorrhage or mass effect. Very small chronic infarctions are present within the bilateral cerebellar hemispheres. Early confluent nonspecific T2 FLAIR hyperintensities in subcortical and periventricular white matter as well as the pons are compatible with moderate chronic microvascular ischemic changes. Mild volume loss of the brain. Cavum septum pellucidum. Vascular: Normal flow voids. Skull and upper cervical spine: Normal marrow signal. Sinuses/Orbits: Negative. Other: None. IMPRESSION: 1. Small group of subcentimeter acute/early subacute infarctions within the left posterior frontal lobe subcortical white matter. No associated hemorrhage or mass effect. 2. Moderate chronic microvascular ischemic changes and mild volume loss of the brain. Very small chronic infarctions within the bilateral cerebellar hemispheres. These results will be called to the ordering clinician or representative by the Radiologist Assistant, and communication documented in the PACS or zVision Dashboard. Electronically Signed   By: Kristine Garbe M.D.   On: 03/20/2019  22:40   Vas US Carotid (at [place] Only)  Result Date: 03/21/2019 Carotid Arterial Duplex Study Indications:       CVA. Risk Factors:      Hypertension, hyperlipidemia, Diabetes. Other Factors:     Atrial fibrillation, COPD, OSA, CHF, NICM. Limitations:       Body habitus, Atrial fibrillation Comparison Study:  No prior study on file for comparison Performing Technologist: Sharion Dove RVS  Examination Guidelines: A complete evaluation includes B-mode imaging, spectral Doppler, color Doppler, and power Doppler as needed of all accessible portions of each vessel. Bilateral testing is considered an integral part of a complete examination. Limited  examinations for reoccurring indications may be performed as noted.  Right Carotid Findings: +----------+--------+--------+--------+--------+------------------+           PSV cm/sEDV cm/sStenosisDescribeComments           +----------+--------+--------+--------+--------+------------------+ CCA Prox  89      33                      intimal thickening +----------+--------+--------+--------+--------+------------------+ CCA Distal72      20                      intimal thickening +----------+--------+--------+--------+--------+------------------+ ICA Prox  63      22                                         +----------+--------+--------+--------+--------+------------------+ ICA Distal94      29                                         +----------+--------+--------+--------+--------+------------------+ ECA       76      12                                         +----------+--------+--------+--------+--------+------------------+ +----------+--------+-------+--------+-------------------+           PSV cm/sEDV cmsDescribeArm Pressure (mmHG) +----------+--------+-------+--------+-------------------+ [id number]                                        +----------+--------+-------+--------+-------------------+ +---------+--------+--+--------+--+ VertebralPSV cm/s62EDV cm/s23 +---------+--------+--+--------+--+  Left Carotid Findings: +----------+--------+--------+--------+--------+------------------+           PSV cm/sEDV cm/sStenosisDescribeComments           +----------+--------+--------+--------+--------+------------------+ CCA Prox  87      31                      intimal thickening +----------+--------+--------+--------+--------+------------------+ CCA Distal87      34                      intimal thickening +----------+--------+--------+--------+--------+------------------+ ICA Prox  84      29                                          +----------+--------+--------+--------+--------+------------------+ ICA Distal75      30                                         +----------+--------+--------+--------+--------+------------------+  ECA       99      13                                         +----------+--------+--------+--------+--------+------------------+ +----------+--------+--------+--------+-------------------+ SubclavianPSV cm/sEDV cm/sDescribeArm Pressure (mmHG) +----------+--------+--------+--------+-------------------+           96                                          +----------+--------+--------+--------+-------------------+ +---------+--------+--+--------+--+ VertebralPSV cm/s44EDV cm/s19 +---------+--------+--+--------+--+  Summary: Right Carotid: The extracranial vessels were near-normal with only minimal wall                thickening or plaque. Left Carotid: The extracranial vessels were near-normal with only minimal wall               thickening or plaque. Vertebrals:  Bilateral vertebral arteries demonstrate antegrade flow. Subclavians: Normal flow hemodynamics were seen in bilateral subclavian              arteries. *See table(s) above for measurements and observations.     Preliminary    EEG Abnormalities: None 03/18/2019 Clinical Interpretation: This normal EEG is recorded in the waking and drowsy state. There was no seizure or seizure predisposition recorded on this study. Please note that lack of epileptiform activity on EEG does not preclude the possibility of epilepsy.    PHYSICAL EXAM  Temp:  [98 F (36.7 C)-98.8 F (37.1 C)] 98.5 F (36.9 C) (05/18 0900) Pulse Rate:  [73-83] 80 (05/18 1310) Resp:  [14-20] 17 (05/18 1310) BP: (88-111)/(47-73) 111/73 (05/18 1310) SpO2:  [93 %-98 %] 97 % (05/18 1310) FiO2 (%):  [32 %] 32 % (05/18 0714)  General - Well nourished, well developed, in no apparent distress.  Ophthalmologic - fundi not visualized due to  noncooperation.  Cardiovascular - Regular rate and rhythm with intermittent premature beats.  Mental Status -  Level of arousal and orientation to time, place, and person were intact. Language including expression, naming, repetition, comprehension was assessed and found intact.  Slight dysarthria Fund of Knowledge was assessed and was intact.  Cranial Nerves II - XII - II - Visual field intact OU. III, IV, VI - Extraocular movements intact. V - Facial sensation intact bilaterally. VII - Facial movement intact bilaterally. VIII - Hearing & vestibular intact bilaterally. X - Palate elevates symmetrically.  Slight dysarthria XI - Chin turning & shoulder shrug intact bilaterally. XII - Tongue protrusion intact.  Motor Strength - The patient's strength was symmetrical in all extremities and pronator drift was absent.  Bulk was normal and fasciculations were absent.   Motor Tone - Muscle tone was assessed at the neck and appendages and was normal.  Reflexes - The patient's reflexes were symmetrical in all extremities and he had no pathological reflexes.  Sensory - Light touch, temperature/pinprick were assessed and were symmetrical.    Coordination - The patient had normal movements in the hands with no ataxia or dysmetria.  Tremor was absent.  Gait and Station - deferred.   ASSESSMENT/PLAN Mr. Nicholas Caldwell is a 73 y.o. male with history of A. fib on Xarelto, COPD noncompliant with inhalers, HFpEF, DB, HLD, HTN, obesity, tobacco abuse, OSA not using CPAP admitted 2 weeks ago for 1 week history of shortness of  breath.  Now admits to having dizziness 2 days prior to admission.  In hospital became unresponsive while taking his daily weight and RRT was called.  Stat CT showed chronic bilateral cerebellar infarcts.  He has a known history of seizures after returning from [place] in the 1970s on phenobarbital Dilantin for many years.  His last seizure was in 1978.  He is now on gabapentin 600  twice daily.  MRI now shows new left posterior frontal lobe subcortical white matter infarcts and he was transferred to the stroke service for care.  Transient unresponsive episode  Likely due to orthostatic hypotension upon standing in the setting of low BP   His BP still on the lower side today  Currently neuro stable  Recommend to check orthostatic vitals once BP improved  Seizure disorder  Remote seizure history post [place] combat.  Possible concussive/percussive etiology.  Last seizure 1978.   on gabapentin 600 mg twice daily  EEG without seizures.  No driving  Stroke: Small group left posterior frontal lobe subcortical white matter infarcts embolic secondary to afib not on AC due to recent GIB with anemia  CT head 03/18/19 small B cerebellar infarcts  MRI  03/20/2019 small left posterior frontal lobe subcortical WM infarct.  Old bilateral cerebellar lacunes.  Carotid Doppler unremarkable  TEE 01/10/2019 EF 40 to 45%.  Right ventricle with reduced systolic function and severely enlarged.  RA size dilated.  LDL 18  HgbA1c 6.5 in January  Eliquis for VTE prophylaxis  Xarelto (rivaroxaban) daily prior to admission but d/c due to GIB, now on Eliquis (apixaban) daily.  Continuing Eliquis at discharge  Therapy recommendations: [facility] PT  Disposition: Pending  Paroxysmal atrial fibrillation, atrial flutter  Home anticoagulation:  Xarelto (rivaroxaban) daily   Xarelto was on hold PTA due to GIB with anemia  started Eliquis in hospital on 03/17/19  Not an optimal ablation candidate  . Plans to continue Eliquis (apixaban) daily at discharge   CHF  Cardiology on board  On milrinone IV  On torsemide, spironolactone, Zebeta, as well as BiDil  Close BP monitoring  Hypertension  Stable on the low side . Given stroke and unresponsive episode, recommend BP goal > 110 . Long-term BP goal normotensive  Recommend to check orthostatic vitals once BP  improved  Hyperlipidemia  Home meds: Crestor 20, resumed in hospital  LDL 18, goal < 70  Continue statin at discharge  Diabetes type II Controlled  HgbA1c 6.5 in January, at goal < 7.0  CBGs  SSI  Controlled  GIB with iron deficiency anemia  S/p PRBCs and IV Fe.    Hgb 7.4-8.5-8.2  Colonoscopy 5/8 unremarkable but EGD showed necrotic mucosa gastric fundus/body - biopsies negative for malignancy.   On Eliquis now  Continue monitoring  Other Stroke Risk Factors  Advanced age  Cigarette smoker, quit 3 months ago, advised to stop smoking  Obesity, Body mass index is 37.4 kg/m., recommend weight loss, diet and exercise as appropriate   Coronary artery disease  Obstructive sleep apnea, noncompliant with CPAP at home  Acute on chronic systolic and diastolic congestive heart failure  NSVT  Other Active Problems  Acute on chronic hypoxic respiratory failure, resolved  Acute on chronic kidney injury, Cre 2.11-2.61  Acute COPD exacerbation  Positive blood culture 1/2, likely contamination  Hospital day # 15  Neurology will sign off. Please call with questions. Pt will follow up with stroke clinic NP at [facility] in about 4 weeks. Thanks for the consult.  Rosalin Hawking, MD PhD Stroke Neurology 03/21/2019 4:35 PM  I spent  35 minutes in total face-to-face time with the patient, more than 50% of which was spent in counseling and coordination of care, reviewing test results, images and medication, and discussing the diagnosis of A. fib off anticoagulation, systolic CHF, GI bleeding with severe anemia, seizure history, unresponsive episode, treatment plan and potential prognosis. This patient's care requiresreview of multiple databases, neurological assessment, discussion with family, other specialists and medical decision making of high complexity.  To contact Stroke Continuity provider, please refer to [email or web address] After hours, contact General Neurology

## 2019-03-21 NOTE — Progress Notes (Signed)
Physical Therapy Treatment Patient Details Name: Nicholas Caldwell MRN: [id number] DOB: [birth year] Today's Date: 03/21/2019    History of Present Illness Pt is a 73 y.o. male admitted 03/06/19 with acute on chronic heart failure and respiratory failure. Found to have severe iron deficiency anemia; now s/p small bowel enteroscopy and biopsy 5/12. Plan for DCCV 5/15. PMH includes CHF, chronic anemia, HLD, obesity, CAD, NICM, polyneuropathy, DM, COPD.    PT Comments    Pt with significant regression. Pt significant more lethargic and weaker compared to last PT session 2 days ago. Pt was requiring supervision to min A pt now requiring mod-maxA and is unable to sit EOB without support and can only amb 15' with RW compared to 115' 2 days ago. Pt reports feeling weaker as well. Previous recommendations were HHPT however today pt would benefit from ST-SNF. PT to cont to follow to progress mobility and re-assess D/C recommendations. Rn aware of medical and functional decline.   Follow Up Recommendations  Home health PT;Supervision/Assistance - 24 hour(may need to look at SNF if patient con't to regress)     Equipment Recommendations  None recommended by PT    Recommendations for Other Services       Precautions / Restrictions Precautions Precautions: Fall Precaution Comments: lethargic Restrictions Weight Bearing Restrictions: No    Mobility  Bed Mobility Overal bed mobility: Needs Assistance Bed Mobility: Supine to Sit     Supine to sit: Mod assist;HOB elevated     General bed mobility comments: HOB elevated, max verbal and tactile cues to move LEs to EOB, modA for trunk elevation and to scoot to EOB, required increased time  Transfers Overall transfer level: Needs assistance Equipment used: Rolling walker (2 wheeled) Transfers: Sit to/from Stand Sit to Stand: Mod assist;+2 safety/equipment Stand pivot transfers: Min assist;+2 physical assistance;+2 safety/equipment       General  transfer comment: bed elevated and with verbal cues pt able to push up from bed, pt requiring modAx2 to push up from chair  Ambulation/Gait Ambulation/Gait assistance: Min assist;+2 physical assistance Gait Distance (Feet): 15 Feet Assistive device: Rolling walker (2 wheeled) Gait Pattern/deviations: Step-to pattern;Decreased stride length;Trunk flexed Gait velocity: slow Gait velocity interpretation: <1.8 ft/sec, indicate of risk for recurrent falls General Gait Details: max encouragement required for pt to even attempt ambulation, pt very SOB, SpO2 in low 80s on 4LO2 via [place]   Stairs             Wheelchair Mobility    Modified Rankin (Stroke Patients Only)       Balance Overall balance assessment: Needs assistance Sitting-balance support: Feet supported;No upper extremity supported Sitting balance-Leahy Scale: Poor Sitting balance - Comments: pt requiring modA to maintain EOB sitting, pt with posterior lean Postural control: Posterior lean Standing balance support: Bilateral upper extremity supported Standing balance-Leahy Scale: Poor Standing balance comment: dependent on RW                            Cognition Arousal/Alertness: Lethargic Behavior During Therapy: Flat affect Overall Cognitive Status: Impaired/Different from baseline Area of Impairment: Problem solving                             Problem Solving: Slow processing;Difficulty sequencing;Decreased initiation;Requires verbal cues;Requires tactile cues General Comments: pt significantly more lethargic/sluggish than previous session, pt requiiring max verbal and tactile cues to sequence transfers, pt very delayed processing  Exercises      General Comments General comments (skin integrity, edema, etc.): pt with bilat unna boots      Pertinent Vitals/Pain Pain Assessment: No/denies pain    Home Living                      Prior Function            PT  Goals (current goals can now be found in the care plan section) Progress towards PT goals: Not progressing toward goals - comment(pt very lethargic)    Frequency    Min 3X/week      PT Plan Current plan remains appropriate    Co-evaluation              AM-PAC PT "6 Clicks" Mobility   Outcome Measure  Help needed turning from your back to your side while in a flat bed without using bedrails?: A Lot Help needed moving from lying on your back to sitting on the side of a flat bed without using bedrails?: A Lot Help needed moving to and from a bed to a chair (including a wheelchair)?: A Lot Help needed standing up from a chair using your arms (e.g., wheelchair or bedside chair)?: A Lot Help needed to walk in hospital room?: A Lot Help needed climbing 3-5 steps with a railing? : A Lot 6 Click Score: 12    End of Session Equipment Utilized During Treatment: Gait belt;Oxygen Activity Tolerance: Patient limited by fatigue;Patient limited by lethargy Patient left: in chair;with call bell/phone within reach Nurse Communication: Mobility status(very sluggish) PT Visit Diagnosis: Unsteadiness on feet (R26.81);Muscle weakness (generalized) (M62.81)     Time: 8891-6945 PT Time Calculation (min) (ACUTE ONLY): 35 min  Charges:  $Gait Training: 8-22 mins $Therapeutic Activity: 8-22 mins                     Kittie Plater, PT, DPT Acute Rehabilitation Services Pager #: [phone] Office #: [phone]    [place]line Lopes 03/21/2019, 9:40 AM

## 2019-03-21 NOTE — Progress Notes (Signed)
Attempted to provide patient with stroke booklet. Patient said he didn't need. RN left copy of booklet with patient in room.

## 2019-03-21 NOTE — Progress Notes (Signed)
PROGRESS NOTE    Nicholas Caldwell  CBS:[id number] DOB: 05-24-1946 DOA: 03/06/2019 PCP: [facility]   Brief Narrative:   Mr. Nicholas Caldwell is a 73 y.o. M with CAD, isch CM EF 40%, sdCHF, pAF on Xarelto, pHTN, MO, OSA not compliant with CPAP, COPD on home O2 3L, and smoking who presented with swelling and dyspnea.  Patient had had slow progressive dyspnea, SOB with exertion, also significant peripheral edema.  In ER, BNP >1000, CXR with edema, HR 124 in Afib, and new anemia Hgb 6.7 g/dL. Transfused and started on Lasix.   Assessment & Plan:   Principal Problem:   Acute on chronic respiratory failure with hypoxia and hypercapnia (HCC) Active Problems:   Essential hypertension   Type 2 diabetes mellitus with neuropathy   PAF (paroxysmal atrial fibrillation) (HCC)   COPD (chronic obstructive pulmonary disease) (HCC)   Medically noncompliant   Atrial fibrillation with RVR (HCC)   AKI (acute kidney injury) ([place])   Acute on chronic anemia   Acute on chronic systolic (congestive) heart failure (HCC)   Acute on chronic systolic CHF (congestive heart failure) (HCC)   Iron deficiency anemia   NSVT (nonsustained ventricular tachycardia) (HCC)   Tobacco abuse counseling   Gastritis and gastroduodenitis   Acute versus subacute ischemic stroke, likely cardioembolic Overnight, patient was apparently confused and nonverbal for a few seconds.  Patient states he was angry at nurses and was just not communicating.  Rapid response was initiated with a CT head notable for small bilateral cerebellar infarcts, question chronic versus acute.  EEG with no seizure activity.  MR brain notable for small group of left frontal acute versus early subacute strokes in the setting of a flutter/atrial fibrillation. --Neurology following, appreciate assistance --Lipid panel 5/2 w/ TC 83, LDL 18, HDL 43, TG 109 --HbA1c 6.07 Nov 2018; repeating in a.m. --Bilateral carotid Dopplers with no significant  findings --continue crestor --Continue anticoagulation with Eliquis --continue to monitor on telemetry --Continue home gabapentin  Acute on chronic hypoxic respiratory failure: resolved Patient presenting from home with progressive dyspnea associated with worsening lower extremity edema.  Likely multifactorial from acute CHF exacerbation and COPD flare, as well as anemia, NSVT/AF.  Chest x-ray on admission notable for bilateral interstitial and alveolar airspace opacities with cardiomegaly. --Now titrated down to home O2 at 3 L per nasal cannula --Continue supportive care  Acute on chronic systolic and diastolic CHF EF 84-66% on TEE in Mar.   --Cardiology/HF team following, appreciate assistance --Net negative 1.7L yesterday; net negative 27.5L since admission --Transition metolazone, Acetazolamide and Lasix gtt to torsemide 50m PO BID today by cards due to increase in creatinine --milrinone reduced today by cards --Continue Crestor --Continue BB --Cardiac, low-salt diet, fluid restrict to 1200 mL's per day  Atrial fibrillation/atypical aflutter Continues with persistent A. fib/flutter on telemetry.  Cardiology following, appreciate assistance.  Has been discussed with EP, not a optimal ablation candidate, may consider in the future. --Started on heparin drip on 03/15/2019; transitioned to Eliquis 5/14 --Hgb stable, 8.2 today --continue beta-blocker --continues in NSR since 5/16 --Continue to monitor on telemetry  Acute kidney injury Creatinine baseline 1.2 - 1.6 for the last 6 months roughly, trending up from 0.9 last few years.  AKI again this hospitalization, has been increasing last few days with diuresis. --Cr 2.25-->2.10-->2.06-->1.98-->1.94-->1.88-->2.61 --Cardiology de-escalating diuretics to p.o. torsemide 80 mg twice daily today --Daily BMP  Iron deficiency anemia Acute blood loss anemia Upper GI bleed Suspected chronic GI blood loss from gastric ulcer.  Received 2  units PRBCs during the hospitalization. EGD 5/12 with multiple nonbleeding erosions, one nonbleeding superficial gastric ulcer with clean ulcer base --Pathology from stomach biopsy negative for metaplasia, dysplasia or malignancy, no H. pylori --s/p Feraheme 5/4, 5/12, and repeat in one week (5/19) --Continue protonix 34m BID x 12 weeks --Minimize NSAID use --Hgb stable 8.6-->8.4-->8.2, no signs of bleeding --Continue to monitor daily CBC, especially now on Eliquis --GI recommends outpatient follow-up and consideration of gastric mapping in the future as an outpatient; will need follow-up with Dr. CBryan Lemma  Acute COPD exacerbation Patient with home O2 dependence at 3 L per nasal cannula. --completed course of steroids and doxycycline --Pulmicort/Brovana --Continue nebs prn --Chest PT, Mucinex, Mucomyst prn --Continue to titrate supplemental oxygen to maintain SPO2 greater than 88%  Positive blood culture CoNS in 1/2.  Likely contaminant.  Hypokalemia -K 4.8 today, potassium reduced to 40 mEq twice daily with transition of diuretics as above. --Repeat electrolytes with magnesium in the a.m.  Diabetes Hemoglobin A1c 6.5 on 11/13/2018.  Home regimen includes metformin 1000 mg p.o. twice daily, NPH 70-30 5u BID. Glucose elevated to 343 this AM, likely exacerbated by steroid use and diet non-compliance --NPH 40u SQ BID --Novolog 18u TIDAC --high dose Insulin sliding scale for further coverage --CBG's QACHS, continue to monitor and adjust insulin regimen as needed  Hypertension Coronary disease secondary prevention -Continue Crestor, BB  Paroxysmal atrial fibrillation NSVT CHA2DS2-Vasc = 4 (Age, CHF, PVD, DM).   -continue anticoagulation with Eliquis -Continue BB  OHS/OSA/MO --Noncompliant with CPAP at home per report, but has been compliant while inpatient --Continue to encourage compliance with CPAP nocturnally   DVT prophylaxis: Eliquis Code Status: FULL  CODE Family Communication: none Disposition Plan: inpatient, continues on milrinone drip.  Transition to oral diuretics today.  PT recommends home health PT when medically ready versus may need SNF if regresses.   Consultants:   Cardiology  Procedures:   EGD 03/15/2019  Antimicrobials:   Doxycycline   Subjective: Patient seen and examined at bedside, resting comfortably.  More tired today poor sleep overnight.  Patient has now converted from atypical flutter to NSR over the past 2 days.  Cardiology transitioning diuretics to oral given the bump in his creatinine. No other complaints at this time.  Breathing improved.  Continues with good diuresis.  Denies headache, no chest pain, no shortness of breath from his normal baseline, no palpitations, no abdominal pain, no weakness, no cough/congestion, no fever/chills/night sweats, no paresthesias.  No other acute events overnight per nursing staff.  Objective: Vitals:   03/21/19 0300 03/21/19 0714 03/21/19 0750 03/21/19 0900  BP: (!) 99/50  (!) 92/56 (!) 98/51  Pulse: 83  80 79  Resp: _0 Temp: 98.8 F (37.1 C)  98 F (36.7 C) 98.5 F (36.9 C)  TempSrc: Oral  Axillary Axillary  SpO2: 98% 98% 94% 98%  Weight:      Height:        Intake/Output Summary (Last 24 hours) at 03/21/2019 0950 Last data filed at 03/21/2019 0500 Gross per 24 hour  Intake 1052.88 ml  Output 1600 ml  Net -547.12 ml   Filed Weights   03/18/19 0500 03/19/19 0500 03/20/19 0300  Weight: 124.9 kg 122.1 kg 125.1 kg    Examination:  General exam: Appears calm and comfortable  Respiratory system: Breath sounds slightly decreased bilateral bases with crackles, normal respiratory effort on 3 L nasal cannula Cardiovascular system: S1 & S2 heard, irregularly irregular rhythm,  normal rate. No JVD, murmurs, rubs, gallops or clicks.  2+ pedal edema bilaterally Gastrointestinal system: Abdomen is nondistended, soft and nontender. No organomegaly or masses  felt. Normal bowel sounds heard. Central nervous system: Alert and oriented. No focal neurological deficits. Extremities: Symmetric 5 x 5 power. Skin: No rashes, lesions or ulcers Psychiatry: Judgement and insight appear normal. Mood & affect appropriate.     Data Reviewed: I have personally reviewed following labs and imaging studies  CBC: Recent Labs  Lab 03/18/19 0700 03/19/19 0500 03/20/19 0500 03/20/19 1037 03/21/19 0500  WBC 10.5 11.8* 9.4 11.5* 10.9*  HGB 8.4* 8.6* 7.4* 8.5* 8.2*  HCT 29.3* 30.2* 26.2* 29.3* 28.9*  MCV 87.7 89.1 90.0 89.1 89.5  PLT 310 312 289 344 381   Basic Metabolic Panel: Recent Labs  Lab 03/17/19 0520 03/18/19 0700 03/19/19 0500 03/20/19 0500 03/20/19 0739 03/20/19 1037 03/21/19 0500  NA 137 137 135 124*  --  135 137  K 3.2* 3.2* 3.8 4.0  --  4.4 4.8  CL 85* 91* 85* 80*  --  89* 89*  CO2 40* 35* 35* 32  --  34* 36*  GLUCOSE 205* 129* 343* 571* 113* 233* 158*  BUN 75* 71* 72* 69*  --  80* 92*  CREATININE 2.06* 1.98* 1.94* 1.88*  --  2.11* 2.61*  CALCIUM 8.5* 8.1* 8.7* 7.7*  --  8.5* 8.6*  MG 2.0 2.0 2.1 1.9  --   --  2.4   GFR: Estimated Creatinine Clearance: 36 mL/min (A) (by C-G formula based on SCr of 2.61 mg/dL (H)). Liver Function Tests: No results for input(s): AST, ALT, ALKPHOS, BILITOT, PROT, ALBUMIN in the last 168 hours. No results for input(s): LIPASE, AMYLASE in the last 168 hours. No results for input(s): AMMONIA in the last 168 hours. Coagulation Profile: No results for input(s): INR, PROTIME in the last 168 hours. Cardiac Enzymes: No results for input(s): CKTOTAL, CKMB, CKMBINDEX, TROPONINI in the last 168 hours. BNP (last 3 results) No results for input(s): PROBNP in the last 8760 hours. HbA1C: No results for input(s): HGBA1C in the last 72 hours. CBG: Recent Labs  Lab 03/20/19 0825 03/20/19 1128 03/20/19 1619 03/20/19 2238 03/21/19 0658  GLUCAP 194* 303* 80 117* 146*   Lipid Profile: No results for  input(s): CHOL, HDL, LDLCALC, TRIG, CHOLHDL, LDLDIRECT in the last 72 hours. Thyroid Function Tests: No results for input(s): TSH, T4TOTAL, FREET4, T3FREE, THYROIDAB in the last 72 hours. Anemia Panel: No results for input(s): VITAMINB12, FOLATE, FERRITIN, TIBC, IRON, RETICCTPCT in the last 72 hours. Sepsis Labs: No results for input(s): PROCALCITON, LATICACIDVEN in the last 168 hours.  No results found for this or any previous visit (from the past 240 hour(s)).       Radiology Studies: Mr Brain 20 Contrast  Result Date: 03/20/2019 CLINICAL DATA:  73 y/o M; altered level of consciousness, unexplained. Abnormal CT head. EXAM: MRI HEAD WITHOUT CONTRAST TECHNIQUE: Multiplanar, multiecho pulse sequences of the brain and surrounding structures were obtained without intravenous contrast. COMPARISON:  03/18/2019 CT head FINDINGS: Brain: Small group of subcentimeter foci of reduced diffusion within left posterior frontal lobe subcortical white matter compatible with acute/early subacute infarction. No associated hemorrhage or mass effect. Very small chronic infarctions are present within the bilateral cerebellar hemispheres. Early confluent nonspecific T2 FLAIR hyperintensities in subcortical and periventricular white matter as well as the pons are compatible with moderate chronic microvascular ischemic changes. Mild volume loss of the brain. Cavum septum pellucidum. Vascular: Normal flow  voids. Skull and upper cervical spine: Normal marrow signal. Sinuses/Orbits: Negative. Other: None. IMPRESSION: 1. Small group of subcentimeter acute/early subacute infarctions within the left posterior frontal lobe subcortical white matter. No associated hemorrhage or mass effect. 2. Moderate chronic microvascular ischemic changes and mild volume loss of the brain. Very small chronic infarctions within the bilateral cerebellar hemispheres. These results will be called to the ordering clinician or representative by the  Radiologist Assistant, and communication documented in the PACS or zVision Dashboard. Electronically Signed   By: Kristine Garbe M.D.   On: 03/20/2019 22:38   Vas US Carotid (at [place] Only)  Result Date: 03/21/2019 Carotid Arterial Duplex Study Indications:       CVA. Risk Factors:      Hypertension, hyperlipidemia, Diabetes. Other Factors:     Atrial fibrillation, COPD, OSA, CHF, NICM. Limitations:       Body habitus, Atrial fibrillation Comparison Study:  No prior study on file for comparison Performing Technologist: Sharion Dove RVS  Examination Guidelines: A complete evaluation includes B-mode imaging, spectral Doppler, color Doppler, and power Doppler as needed of all accessible portions of each vessel. Bilateral testing is considered an integral part of a complete examination. Limited examinations for reoccurring indications may be performed as noted.  Right Carotid Findings: +----------+--------+--------+--------+--------+------------------+             PSV cm/s EDV cm/s Stenosis Describe Comments            +----------+--------+--------+--------+--------+------------------+  CCA Prox   89       33                         intimal thickening  +----------+--------+--------+--------+--------+------------------+  CCA Distal 72       20                         intimal thickening  +----------+--------+--------+--------+--------+------------------+  ICA Prox   63       22                                             +----------+--------+--------+--------+--------+------------------+  ICA Distal 94       29                                             +----------+--------+--------+--------+--------+------------------+  ECA        76       12                                             +----------+--------+--------+--------+--------+------------------+ +----------+--------+-------+--------+-------------------+             PSV cm/s EDV cms Describe Arm Pressure (mmHG)   +----------+--------+-------+--------+-------------------+  Subclavian 112                                            +----------+--------+-------+--------+-------------------+ +---------+--------+--+--------+--+  Vertebral PSV cm/s 62 EDV cm/s 23  +---------+--------+--+--------+--+  Left Carotid Findings: +----------+--------+--------+--------+--------+------------------+  PSV cm/s EDV cm/s Stenosis Describe Comments            +----------+--------+--------+--------+--------+------------------+  CCA Prox   87       31                         intimal thickening  +----------+--------+--------+--------+--------+------------------+  CCA Distal 87       34                         intimal thickening  +----------+--------+--------+--------+--------+------------------+  ICA Prox   84       29                                             +----------+--------+--------+--------+--------+------------------+  ICA Distal 75       30                                             +----------+--------+--------+--------+--------+------------------+  ECA        99       13                                             +----------+--------+--------+--------+--------+------------------+ +----------+--------+--------+--------+-------------------+  Subclavian PSV cm/s EDV cm/s Describe Arm Pressure (mmHG)  +----------+--------+--------+--------+-------------------+             96                                              +----------+--------+--------+--------+-------------------+ +---------+--------+--+--------+--+  Vertebral PSV cm/s 44 EDV cm/s 19  +---------+--------+--+--------+--+  Summary: Right Carotid: The extracranial vessels were near-normal with only minimal wall                thickening or plaque. Left Carotid: The extracranial vessels were near-normal with only minimal wall               thickening or plaque. Vertebrals:  Bilateral vertebral arteries demonstrate antegrade flow. Subclavians: Normal flow  hemodynamics were seen in bilateral subclavian              arteries. *See table(s) above for measurements and observations.     Preliminary         Scheduled Meds:   stroke: mapping our early stages of recovery book   Does not apply Once   apixaban  5 mg Oral BID   arformoterol  15 mcg Nebulization BID   bisoprolol  2.5 mg Oral Daily   budesonide (PULMICORT) nebulizer solution  0.5 mg Nebulization BID   docusate sodium  100 mg Oral Daily   ferrous sulfate  325 mg Oral BID WC   folic acid  1 mg Oral Daily   gabapentin  600 mg Oral BID   Gerhardt's butt cream   Topical Daily   guaiFENesin  400 mg Oral BID   insulin aspart  0-20 Units Subcutaneous TID WC   insulin aspart  0-5 Units Subcutaneous QHS   insulin aspart  18  Units Subcutaneous TID WC   insulin NPH Human  40 Units Subcutaneous BID AC & HS   ipratropium-albuterol  3 mL Nebulization TID   isosorbide-hydrALAZINE  1 tablet Oral TID   latanoprost  1 drop Both Eyes QHS   mouth rinse  15 mL Mouth Rinse BID   nicotine  7 mg Transdermal Daily   pantoprazole  40 mg Oral BID   polyethylene glycol  17 g Oral Daily   potassium chloride  40 mEq Oral BID   rosuvastatin  20 mg Oral q1800   sodium chloride flush  10-40 mL Intracatheter Q12H   sodium chloride flush  3 mL Intravenous Q12H   spironolactone  25 mg Oral Daily   torsemide  80 mg Oral BID   Continuous Infusions:  sodium chloride     milrinone 0.125 mcg/kg/min (03/21/19 0854)     LOS: 15 days    Time spent: 38 minutes    Ajahni Nay J [place] DO [facility] Pager [phone]  If 7PM-7AM, please contact night-coverage [email or web address] Password [email or web address] 03/21/2019, 9:50 AM

## 2019-03-21 NOTE — Evaluation (Signed)
Speech Language Pathology Evaluation Patient Details Name: Nicholas Caldwell MRN: [id number] DOB: [birth year] Today's Date: 03/21/2019 Time: 7846-9629 SLP Time Calculation (min) (ACUTE ONLY): 25 min  Problem List:  Patient Active Problem List   Diagnosis Date Noted  . Gastritis and gastroduodenitis   . NSVT (nonsustained ventricular tachycardia) ([place]) 03/08/2019  . Tobacco abuse counseling 03/08/2019  . Iron deficiency anemia   . Acute on chronic systolic CHF (congestive heart failure) ([place]) 03/06/2019  . Diabetes mellitus type 2 in obese ([place])   . Primary osteoarthritis of right hip   . Hypomagnesemia   . Steroid-induced hyperglycemia   . Supplemental oxygen dependent   . Leukocytosis   . Hypokalemia   . Acute on chronic anemia   . Diabetic peripheral neuropathy ([place])   . Acute on chronic systolic (congestive) heart failure ([place])   . Acute on chronic respiratory failure ([place]) 01/14/2019  . Hypoxia   . Altered mental status   . Heart failure ([place]) 12/30/2018  . AKI (acute kidney injury) ([place])   . Acute respiratory failure ([place]) 11/22/2018  . Acute on chronic respiratory failure with hypoxia and hypercapnia ([place]) 11/13/2018  . Metabolic encephalopathy 52/84/1324  . Acute respiratory failure with hypoxia and hypercapnia ([place]) 07/26/2018  . Acute exacerbation of CHF (congestive heart failure) ([place]) 07/26/2018  . CHF exacerbation ([place]) 05/01/2018  . CHF (congestive heart failure) ([place]) 04/30/2018  . Chronic respiratory failure with hypoxia ([place]) 12/28/2017  . Atrial fibrillation with RVR ([place])   . SVT (supraventricular tachycardia) ([place])   . COPD GOLD 0   . Medically noncompliant   . Panlobular emphysema ([place])   . OSA (obstructive sleep apnea)   . Pulmonary hypertension ([place])   . Disorientation   . Pressure injury of skin 09/07/2016  . Acute on chronic combined systolic and diastolic CHF (congestive heart failure) ([place]) 09/05/2016  . Skin lesion-left heal 09/05/2016  . Chronic  systolic CHF (congestive heart failure) ([place]) 07/16/2016  . COPD (chronic obstructive pulmonary disease) ([place]) 07/16/2016  . Uncontrolled type 2 diabetes mellitus with complication ([place])   . Diabetic polyneuropathy associated with diabetes mellitus due to underlying condition ([place])   . Normocytic anemia 06/29/2016  . CAD - Non-obstructive by LHC1/16 01/21/2016  . Nonischemic cardiomyopathy ([place]) 10/09/2015  . PAF (paroxysmal atrial fibrillation) ([place])   . Chronic pain 02/19/2015  . Morbid obesity ([place]) 02/13/2015  . Dyspnea   . Elevated troponin I level 11/01/2014  . COPD exacerbation ([place]) 11/01/2014  . Essential hypertension 10/31/2014  . Type 2 diabetes mellitus with neuropathy 10/31/2014  . Hyperlipidemia  10/31/2014  . Cigarette smoker 10/31/2014   Past Medical History:  Past Medical History:  Diagnosis Date  . Atrial flutter ([place])    a. recurrent AFlutter with RVR;  b. Amiodarone Rx started 4/16  . CAD (coronary artery disease)    a. LHC 1/16:  mLAD diffuse disease, pLCx mild disease, dLCx with disease but too small for PCI, RCA ok, EF 25-30%  . Chronic pain   . Chronic systolic CHF (congestive heart failure) ([place])   . COPD (chronic obstructive pulmonary disease) ([place])   . Diabetes mellitus without complication ([place])   . Hypercholesteremia   . Hypertension   . NICM (nonischemic cardiomyopathy) ([place])    a.dx 2016. b. 2D echo 06/2016 - Last echo 07/01/16: mod dilated LV, mod LVH, EF 25-30%, mild-mod MR, sev LAE, mild-mod reduced RV systolic function, mild-mod TR, PASP 64mHG.  .Marland KitchenPAF (paroxysmal atrial fibrillation) (HCC)    On amio -  ot a candidate for flecainide due to cardiomyopathy, not a candidate for Tikosyn due to prolonged QT, and felt to be a poor candidate for ablation given left atrial size.  . Pulmonary hypertension ([place])   . Tobacco abuse    Past Surgical History:  Past Surgical History:  Procedure Laterality Date  . BIOPSY  03/11/2019   Procedure: BIOPSY;   Surgeon: Lavena Bullion, DO;  Location: [place];  Service: Gastroenterology;;  . BIOPSY  03/15/2019   Procedure: BIOPSY;  Surgeon: Irving Copas., MD;  Location: [place];  Service: Gastroenterology;;  . CARDIOVERSION N/A 07/30/2018   Procedure: CARDIOVERSION;  Surgeon: Larey Dresser, MD;  Location: [facility] ENDOSCOPY;  Service: Cardiovascular;  Laterality: N/A;  . COLONOSCOPY N/A 03/11/2019   Procedure: COLONOSCOPY;  Surgeon: Lavena Bullion, DO;  Location: [place];  Service: Gastroenterology;  Laterality: N/A;  . ENTEROSCOPY N/A 03/15/2019   Procedure: ENTEROSCOPY;  Surgeon: Rush Landmark Telford Nab., MD;  Location: [place];  Service: Gastroenterology;  Laterality: N/A;  . ESOPHAGOGASTRODUODENOSCOPY Left 03/11/2019   Procedure: ESOPHAGOGASTRODUODENOSCOPY (EGD);  Surgeon: Lavena Bullion, DO;  Location: [facility] ENDOSCOPY;  Service: Gastroenterology;  Laterality: Left;  . LEFT AND RIGHT HEART CATHETERIZATION WITH CORONARY ANGIOGRAM N/A 11/06/2014   Procedure: LEFT AND RIGHT HEART CATHETERIZATION WITH CORONARY ANGIOGRAM;  Surgeon: Jettie Booze, MD;  Location: [facility] CATH LAB;  Service: Cardiovascular;  Laterality: N/A;  . RIGHT/LEFT HEART CATH AND CORONARY ANGIOGRAPHY N/A 12/06/2018   Procedure: RIGHT/LEFT HEART CATH AND CORONARY ANGIOGRAPHY;  Surgeon: Larey Dresser, MD;  Location: [facility] CV LAB;  Service: Cardiovascular;  Laterality: N/A;  . SPLENECTOMY    . SUBMUCOSAL TATTOO INJECTION  03/15/2019   Procedure: SUBMUCOSAL TATTOO INJECTION;  Surgeon: Rush Landmark Telford Nab., MD;  Location: [place];  Service: Gastroenterology;;   HPI:  Philmore Pali is a 73 y.o. male admitted 03/06/19 with acute on chronic heart failure and respiratory failure. Found to have severe iron deficiency anemia; now s/p small bowel enteroscopy and biopsy 5/12. Plan for DCCV 5/15. PMH includes CHF, chronic anemia, HLD, obesity, CAD, NICM, polyneuropathy, DM, COPD. MRI on 5/17 revealed small group  of subcentimeter acute/early subacute infarctions of left posterior frontal lobe subcortical white matter; very small chronic infarctions within bilateral cerebellar hemispheres.    Assessment / Plan / Recommendation Clinical Impression  Patient presents with a mild cognitive-linguisitic impairment consistent with location of CVA in frontal lobe. He exhibited deficits in: memory, pragmatics(flat affect, poor eye contact), awareness, attention, and cognitive processing. He would make excuses at times, telling SLP, "you ask a lot of weird questions" and when SLP asked about his hand/arm tremor, he stated "Its because Im cold, doesn't it feel cold in here to you?" His participation improved somewhat during session, but overall he was not particularly engaged in tasks and did not appear to have concern when he struggled with responses. Patient stated that he was retired but did manage finances at home. He will benefit from a brief period of acute SLP intervention followed by inpatient rehab versus SNF-level to return to or near cognitive-linguistic baseline.    SLP Assessment  SLP Recommendation/Assessment: Patient needs continued Speech Lanaguage Pathology Services SLP Visit Diagnosis: Cognitive communication deficit (R41.841)    Follow Up Recommendations  Other (comment)(TBD pending progress)    Frequency and Duration    1 week      SLP Evaluation Cognition  Overall Cognitive Status: Impaired/Different from baseline Orientation Level: Oriented X4 Attention: Selective Selective Attention: Impaired Selective Attention Impairment:  Verbal basic;Verbal complex;Functional basic Memory: Impaired Memory Impairment: Storage deficit;Retrieval deficit Awareness: Impaired Awareness Impairment: Emergent impairment Problem Solving: Impaired Problem Solving Impairment: Verbal basic;Verbal complex Safety/Judgment: Impaired       Comprehension  Auditory Comprehension Overall Auditory Comprehension:  Appears within functional limits for tasks assessed    Expression Expression Primary Mode of Expression: Verbal Verbal Expression Overall Verbal Expression: Impaired Initiation: No impairment Level of Generative/Spontaneous Verbalization: Sentence;Conversation Repetition: No impairment Pragmatics: Impairment Impairments: Abnormal affect;Monotone;Eye contact Interfering Components: Attention Effective Techniques: Open ended questions Non-Verbal Means of Communication: Not applicable Written Expression Dominant Hand: Right Written Expression: Not tested   Oral / Motor  Oral Motor/Sensory Function Overall Oral Motor/Sensory Function: Within functional limits Motor Speech Overall Motor Speech: Appears within functional limits for tasks assessed   GO                    Dannial Monarch 03/21/2019, 3:37 PM   Sonia Baller, MA, CCC-SLP Speech Therapy [facility] Acute Rehab Pager: [phone]

## 2019-03-21 NOTE — Progress Notes (Signed)
VASCULAR LAB PRELIMINARY  PRELIMINARY  PRELIMINARY  PRELIMINARY  Carotid duplex completed.    Preliminary report:  See CV proc for preliminary results.  Huston Stonehocker, RVT 03/21/2019, 9:37 AM

## 2019-03-21 NOTE — Progress Notes (Signed)
Chart review note  Patient signed out to me by Dr. Leonel Ramsay for follow-up of MRI brain. Patient seen by the neurology service for 3 to 4-minute period of unresponsiveness. History of seizures many years ago-last seizure 46.  MRI reviewed-has a small group of left frontal acute/early subacute strokes. Given the history of atrial flutter/fibrillation-likely cardioembolic etiology.  EEG-normal  Currently has anticoagulation on hold.  From a neurological standpoint, should resume anticoagulation whenever okay from a general medical standpoint.  If the strokes occurred with compliance to the anticoagulation-consider changing the agent from Xarelto to Eliquis.  Impression: Acute/subacute ischemic stroke likely cardioembolic.  Other etiologies could include hypoperfusion.  Work-up as below  Recommendations: -Echo -already ordered on the chart by cardiology -MRA head without contrast - Bilateral carotid Dopplers -Hemoglobin A1c -Fasting lipid panel -Orthostatic vitals -Antiplatelet/anticoagulant on hold for anemia-okay to start anticoagulation from a neurological standpoint but has medical issues precluding- decision on starting upon medical team's evaluation.  Stroke team will follow with you  -- Amie Portland, MD [facility] Pager: [phone] If 7pm to 7am, please call on call as listed on AMION.

## 2019-03-21 NOTE — Progress Notes (Addendum)
Patient ID: Nicholas Caldwell, male   DOB: [birth year], 73 y.o.   MRN: [id number]     Advanced Heart Failure Rounding Note  PCP-Cardiologist: No primary care provider on file.   Subjective:    Milrinone started 5/10 at 0.25. Co-ox 81% today.  CVP remains 16. He is on Lasix gtt 15, Diamox, and metolazone.    Remains on lasix gtt at 15, diamox, daily metolazone and milrinone.  Creatinine and BUN rising, creatinine up to 2.61.   Sleeping but awakens.  Denies CP, SOB, orthopnea or PND.  He remains in NSR on Eliquis.  Hgb stable at 8.2.   MRI head with small chronic infarctions in cerebellum and small acute/early subacute infarctions left posterior frontal lobe.    Objective:   Weight Range: 125.1 kg Body mass index is 37.4 kg/m.   Vital Signs:   Temp:  [98.1 F (36.7 C)-98.8 F (37.1 C)] 98.8 F (37.1 C) (05/18 0300) Pulse Rate:  [76-83] 83 (05/18 0300) Resp:  [17-20] 19 (05/18 0300) BP: (94-106)/(49-61) 99/50 (05/18 0300) SpO2:  [96 %-98 %] 98 % (05/18 0300) Last BM Date: 03/16/19  Weight change: Filed Weights   03/18/19 0500 03/19/19 0500 03/20/19 0300  Weight: 124.9 kg 122.1 kg 125.1 kg    Intake/Output:   Intake/Output Summary (Last 24 hours) at 03/21/2019 0737 Last data filed at 03/21/2019 0500 Gross per 24 hour  Intake 1272.88 ml  Output 2950 ml  Net -1677.12 ml      Physical Exam    General: NAD Neck: JVP 12 cm, no thyromegaly or thyroid nodule.  Lungs: Decreased BS at bases.  CV: Nondisplaced PMI.  Heart regular S1/S2, no S3/S4, no murmur.  1+ ankle edema with Unna boots.  Abdomen: Soft, nontender, no hepatosplenomegaly, no distention.  Skin: Intact without lesions or rashes.  Neurologic: Alert and oriented x 3.  Psych: Normal affect. Extremities: No clubbing or cyanosis.  HEENT: Normal.    Telemetry   NSR with frequent PACs in 80s (personally reviewed).   Labs    CBC Recent Labs    03/20/19 1037 03/21/19 0500  WBC 11.5* 10.9*  HGB 8.5* 8.2*   HCT 29.3* 28.9*  MCV 89.1 89.5  PLT 344 177   Basic Metabolic Panel Recent Labs    03/20/19 0500  03/20/19 1037 03/21/19 0500  NA 124*  --  135 137  K 4.0  --  4.4 4.8  CL 80*  --  89* 89*  CO2 32  --  34* 36*  GLUCOSE 571*   < > 233* 158*  BUN 69*  --  80* 92*  CREATININE 1.88*  --  2.11* 2.61*  CALCIUM 7.7*  --  8.5* 8.6*  MG 1.9  --   --  2.4   < > = values in this interval not displayed.   Liver Function Tests No results for input(s): AST, ALT, ALKPHOS, BILITOT, PROT, ALBUMIN in the last 72 hours. No results for input(s): LIPASE, AMYLASE in the last 72 hours. Cardiac Enzymes No results for input(s): CKTOTAL, CKMB, CKMBINDEX, TROPONINI in the last 72 hours.  BNP: BNP (last 3 results) Recent Labs    12/01/18 0015 12/30/18 1621 03/06/19 1829  BNP 1,666.7* 1,273.0* 1,020.1*    ProBNP (last 3 results) No results for input(s): PROBNP in the last 8760 hours.   D-Dimer No results for input(s): DDIMER in the last 72 hours. Hemoglobin A1C No results for input(s): HGBA1C in the last 72 hours. Fasting Lipid Panel No results for  input(s): CHOL, HDL, LDLCALC, TRIG, CHOLHDL, LDLDIRECT in the last 72 hours. Thyroid Function Tests No results for input(s): TSH, T4TOTAL, T3FREE, THYROIDAB in the last 72 hours.  Invalid input(s): FREET3  Other results:   Imaging    Mr Brain Wo Contrast  Result Date: 03/20/2019 CLINICAL DATA:  73 y/o M; altered level of consciousness, unexplained. Abnormal CT head. EXAM: MRI HEAD WITHOUT CONTRAST TECHNIQUE: Multiplanar, multiecho pulse sequences of the brain and surrounding structures were obtained without intravenous contrast. COMPARISON:  03/18/2019 CT head FINDINGS: Brain: Small group of subcentimeter foci of reduced diffusion within left posterior frontal lobe subcortical white matter compatible with acute/early subacute infarction. No associated hemorrhage or mass effect. Very small chronic infarctions are present within the bilateral  cerebellar hemispheres. Early confluent nonspecific T2 FLAIR hyperintensities in subcortical and periventricular white matter as well as the pons are compatible with moderate chronic microvascular ischemic changes. Mild volume loss of the brain. Cavum septum pellucidum. Vascular: Normal flow voids. Skull and upper cervical spine: Normal marrow signal. Sinuses/Orbits: Negative. Other: None. IMPRESSION: 1. Small group of subcentimeter acute/early subacute infarctions within the left posterior frontal lobe subcortical white matter. No associated hemorrhage or mass effect. 2. Moderate chronic microvascular ischemic changes and mild volume loss of the brain. Very small chronic infarctions within the bilateral cerebellar hemispheres. These results will be called to the ordering clinician or representative by the Radiologist Assistant, and communication documented in the PACS or zVision Dashboard. Electronically Signed   By: Kristine Garbe M.D.   On: 03/20/2019 22:38     Medications:     Scheduled Medications: .  stroke: mapping our early stages of recovery book   Does not apply Once  . acetaZOLAMIDE  250 mg Oral BID  . apixaban  5 mg Oral BID  . arformoterol  15 mcg Nebulization BID  . bisoprolol  2.5 mg Oral Daily  . budesonide (PULMICORT) nebulizer solution  0.5 mg Nebulization BID  . docusate sodium  100 mg Oral Daily  . ferrous sulfate  325 mg Oral BID WC  . folic acid  1 mg Oral Daily  . gabapentin  600 mg Oral BID  . Gerhardt's butt cream   Topical Daily  . guaiFENesin  400 mg Oral BID  . insulin aspart  0-20 Units Subcutaneous TID WC  . insulin aspart  0-5 Units Subcutaneous QHS  . insulin aspart  18 Units Subcutaneous TID WC  . insulin NPH Human  40 Units Subcutaneous BID AC & HS  . ipratropium-albuterol  3 mL Nebulization TID  . isosorbide-hydrALAZINE  1 tablet Oral TID  . latanoprost  1 drop Both Eyes QHS  . mouth rinse  15 mL Mouth Rinse BID  . nicotine  7 mg Transdermal  Daily  . pantoprazole  40 mg Oral BID  . polyethylene glycol  17 g Oral Daily  . potassium chloride  40 mEq Oral BID  . rosuvastatin  20 mg Oral q1800  . sodium chloride flush  10-40 mL Intracatheter Q12H  . sodium chloride flush  3 mL Intravenous Q12H  . spironolactone  25 mg Oral Daily  . torsemide  80 mg Oral BID    Infusions: . sodium chloride    . milrinone 0.25 mcg/kg/min (03/20/19 2345)    PRN Medications: sodium chloride, acetaminophen, acetylcysteine, alum & mag hydroxide-simeth, diazepam, fluticasone, hydrocortisone cream, ondansetron (ZOFRAN) IV, oxyCODONE-acetaminophen, sodium chloride flush, sodium chloride flush   Assessment/Plan   1. Acute on chronic systolic CHF: Nonischemic cardiomyopathy.  With prominent RV failure, likely related to COPD/OSA/OHS. TEE in 3/20 with EF 40-45%, severely dilated RV with moderately decreased systolic function. He has had multiple recent admissions with CHF and COPD exacerbations.  He has not cooperated with paramedicine or [facility] home health as outpatient, has not been using CPAP per his wife, suspect significant medication noncompliance as well though he denies.  Milrinone started 5/9 for RV support. Now on 0.25 with co-ox 81%.  CVP still elevated but lower at 16 and creatinine rising.  I suspect that we are not going to be able to diurese him to a normal CVP with his RV failure.   - With creatinine rise, I am going to transition his diuretics to torsemide 80 mg bid (he was on this at home). Decrease KCl to 40 bid.  - Decrease milrinone to 0.125 mcg/kg/min.  - Continue UNNA boots - Continue bisoprolol 2.5 daily.  - Continue Bidil 1 tab tid.  - Continue spironolactone 25 mg daily.    - Cardiomems may be helpful as outpatient.  - I think his long-term prognosis, unfortunately, will be poor with significant RV dysfunction and history of poor compliance.  2. Anemia: Baseline hgb around 8.  6.7 at admission, denies overt GI bleeding.  He has  had 2 unit PRBCs and IV Fe.  Hgb 7.4 today, lower but no overt bleeding.  Colonoscopy 5/8 unremarkable but EGD showed necrotic mucosa gastric fundus/body that is concerning for gastric cancer, biopsies taken which were negative for malignancy. Hgb fairly stable at 8.2 today.  - Continue Eliquis - Has received IV iron per primary team and GI 3. Atrial fibrillation/flutter: Patient has history of paroxysmal atrial fibrillation.  This admission, he appears to be in an atypical atrial flutter.  Rate is currently controlled. Xarelto initially held with GI bleeding.  Was seen in past by Dr. Rayann Heman, not thought to be good ablation candidate.  He has not been on Tikosyn or amiodarone due to history of long QT. Converted back to NSR on 5/16, in NSR today.  - Will eventually ask EP to reconsider ablation given lack of good anti-arrhythmic options. He seems to do better in NSR.  - Continue Eliquis.  4. COPD: Severe, on 3L home oxygen.  He was wheezing initially but now much improved.  This may be due to pulmonary edema, but also consider component of COPD exacerbation.  - Per primary team  5. OHS/OSA: On home oxygen.  Per wife, has not been using CPAP at home.  Will give CPAP at night here.  6. ID: Suspect this is primarily a CHF exacerbation.  Afebrile and WBCs not significantly elevated.  1 blood culture positive for what appears to be coagulase negative Staph (probably contaminant).  COVID-19 negative.  7. CAD: Nonobstructive on prior cath.  No s/s ischemia. He is on Crestor. Off ASA with DOAC and GI bleed  8. Acute on chronic kidney failure III: Baseline seems to be 1.3-1.6.  Creatinine up to 2.6 today.  - Cutting back on diuretics as above, think we will have to accept a somewhat elevated CVP.  9. DM2 with poor control - TRH managing  10. CVA: MRI head showed both small acute/subacute and old CVAs.  Neuro has seen, suspect related to atrial fibrillation.  - He has started on Eliquis (was on Xarelto prior).    Length of Stay: [place], MD  03/21/2019, 7:37 AM  Advanced Heart Failure Team Pager [phone] (M-F; 7a - 4p)  Please contact  [facility] for night-coverage after hours (4p -7a ) and weekends on [email or web address]

## 2019-03-22 DIAGNOSIS — I509 Heart failure, unspecified: Secondary | ICD-10-CM

## 2019-03-22 LAB — GLUCOSE, CAPILLARY
Glucose-Capillary: 52 mg/dL — ABNORMAL LOW (ref 70–99)
Glucose-Capillary: 116 mg/dL — ABNORMAL HIGH (ref 70–99)
Glucose-Capillary: 207 mg/dL — ABNORMAL HIGH (ref 70–99)
Glucose-Capillary: 105 mg/dL — ABNORMAL HIGH (ref 70–99)
Glucose-Capillary: 67 mg/dL — ABNORMAL LOW (ref 70–99)
Glucose-Capillary: 97 mg/dL (ref 70–99)

## 2019-03-22 LAB — COOXEMETRY PANEL
Carboxyhemoglobin: 2 % — ABNORMAL HIGH (ref 0.5–1.5)
Methemoglobin: 2 % — ABNORMAL HIGH (ref 0.0–1.5)
O2 Saturation: 83.8 %
Total hemoglobin: 8 g/dL — ABNORMAL LOW (ref 12.0–16.0)

## 2019-03-22 LAB — BASIC METABOLIC PANEL
Anion gap: 11 (ref 5–15)
BUN: 93 mg/dL — ABNORMAL HIGH (ref 8–23)
CO2: 37 mmol/L — ABNORMAL HIGH (ref 22–32)
Calcium: 8.7 mg/dL — ABNORMAL LOW (ref 8.9–10.3)
Chloride: 91 mmol/L — ABNORMAL LOW (ref 98–111)
Creatinine, Ser: 2.39 mg/dL — ABNORMAL HIGH (ref 0.61–1.24)
GFR calc Af Amer: 31 mL/min — ABNORMAL LOW (ref >60)
GFR calc non Af Amer: 26 mL/min — ABNORMAL LOW (ref >60)
Glucose, Bld: 49 mg/dL — ABNORMAL LOW (ref 70–99)
Potassium: 3.8 mmol/L (ref 3.5–5.1)
Sodium: 139 mmol/L (ref 135–145)

## 2019-03-22 LAB — CBC
HCT: 29.7 % — ABNORMAL LOW (ref 39.0–52.0)
Hemoglobin: 8.4 g/dL — ABNORMAL LOW (ref 13.0–17.0)
MCH: 25.2 pg — ABNORMAL LOW (ref 26.0–34.0)
MCHC: 28.3 g/dL — ABNORMAL LOW (ref 30.0–36.0)
MCV: 89.2 fL (ref 80.0–100.0)
Platelets: 372 10*3/uL (ref 150–400)
RBC: 3.33 MIL/uL — ABNORMAL LOW (ref 4.22–5.81)
RDW: 25 % — ABNORMAL HIGH (ref 11.5–15.5)
WBC: 10.8 10*3/uL — ABNORMAL HIGH (ref 4.0–10.5)
nRBC: 0 % (ref 0.0–0.2)

## 2019-03-22 LAB — MAGNESIUM: Magnesium: 2.4 mg/dL (ref 1.7–2.4)

## 2019-03-22 LAB — HEMOGLOBIN A1C
Hgb A1c MFr Bld: 6.8 % — ABNORMAL HIGH (ref 4.8–5.6)
Mean Plasma Glucose: 148.46 mg/dL

## 2019-03-22 MED ORDER — FUROSEMIDE 10 MG/ML IJ SOLN
120.0000 mg | Freq: Two times a day (BID) | INTRAVENOUS | Status: DC
  Administered 2019-03-22 – 2019-03-23 (×2): 120 mg via INTRAVENOUS
  Filled 2019-03-22: qty 10
  Filled 2019-03-22 (×2): qty 12

## 2019-03-22 MED ORDER — POTASSIUM CHLORIDE CRYS ER 20 MEQ PO TBCR
40.0000 meq | EXTENDED_RELEASE_TABLET | Freq: Once | ORAL | Status: AC
  Administered 2019-03-22: 40 meq via ORAL
  Filled 2019-03-22: qty 2

## 2019-03-22 MED ORDER — METOLAZONE 5 MG PO TABS
5.0000 mg | ORAL_TABLET | Freq: Once | ORAL | Status: AC
  Administered 2019-03-22: 5 mg via ORAL
  Filled 2019-03-22: qty 1

## 2019-03-22 MED ORDER — ZINC OXIDE 40 % EX OINT
TOPICAL_OINTMENT | Freq: Two times a day (BID) | CUTANEOUS | Status: DC
  Administered 2019-03-22 – 2019-03-24 (×3): via TOPICAL
  Administered 2019-03-24: 1 via TOPICAL
  Administered 2019-03-25 – 2019-03-27 (×5): via TOPICAL
  Administered 2019-03-27: 1 via TOPICAL
  Administered 2019-03-28 – 2019-03-30 (×5): via TOPICAL
  Administered 2019-03-31: 1 via TOPICAL
  Filled 2019-03-22: qty 57

## 2019-03-22 MED ORDER — OXYCODONE-ACETAMINOPHEN 5-325 MG PO TABS
2.0000 | ORAL_TABLET | Freq: Two times a day (BID) | ORAL | Status: DC | PRN
  Administered 2019-03-23 (×3): 2 via ORAL
  Filled 2019-03-22 (×6): qty 2

## 2019-03-22 MED ORDER — INSULIN ASPART 100 UNIT/ML ~~LOC~~ SOLN
10.0000 [IU] | Freq: Three times a day (TID) | SUBCUTANEOUS | Status: DC
  Administered 2019-03-22 (×2): 10 [IU] via SUBCUTANEOUS

## 2019-03-22 MED ORDER — INSULIN NPH (HUMAN) (ISOPHANE) 100 UNIT/ML ~~LOC~~ SUSP
30.0000 [IU] | Freq: Two times a day (BID) | SUBCUTANEOUS | Status: DC
  Administered 2019-03-22: 12:00:00 30 [IU] via SUBCUTANEOUS

## 2019-03-22 NOTE — Evaluation (Signed)
Occupational Therapy Evaluation Patient Details Name: Nicholas Caldwell MRN: [id number] DOB: 06-19-1946 Today's Date: 03/22/2019    History of Present Illness Pt is a 73 y.o. male admitted 03/06/19 with acute on chronic heart failure and respiratory failure. Found to have severe iron deficiency anemia; now s/p small bowel enteroscopy and biopsy 5/12. Plan for DCCV 5/15. PMH includes CHF, chronic anemia, HLD, obesity, CAD, NICM, polyneuropathy, DM, COPD.   Clinical Impression   This 73 yo male admitted with above presents to acute OT with decreased endurance, decreased mobility--very stiff movements, decreased safety awareness and increased need for A all affecting his safety and independence with basic ADLs as well as increased burden of care on his wife. He would benefit from acute OT with follow up OT on CIR.     Follow Up Recommendations  CIR;Supervision/Assistance - 24 hour    Equipment Recommendations  Other (comment)(TBD next venue)       Precautions / Restrictions Precautions Precautions: Fall Restrictions Weight Bearing Restrictions: No      Mobility Bed Mobility Overal bed mobility: Needs Assistance Bed Mobility: Sit to Supine       Sit to supine: Mod assist(A for legs)      Transfers Overall transfer level: Needs assistance Equipment used: Rolling walker (2 wheeled) Transfers: Sit to/from Stand Sit to Stand: Mod assist Stand pivot transfers: Mod assist            Balance Overall balance assessment: Needs assistance Sitting-balance support: No upper extremity supported;Feet supported Sitting balance-Leahy Scale: Fair     Standing balance support: Bilateral upper extremity supported Standing balance-Leahy Scale: Poor Standing balance comment: dependent on RW and additional external A from therapist                           ADL either performed or assessed with clinical judgement   ADL Overall ADL's : Needs assistance/impaired Eating/Feeding:  Set up Eating/Feeding Details (indicate cue type and reason): supported sitting Grooming: Set up;Supervision/safety Grooming Details (indicate cue type and reason): supported sitting Upper Body Bathing: Moderate assistance Upper Body Bathing Details (indicate cue type and reason): supported sitting Lower Body Bathing: Total assistance Lower Body Bathing Details (indicate cue type and reason): Mod A sit<>stand from recliner Upper Body Dressing : Moderate assistance Upper Body Dressing Details (indicate cue type and reason): supported sitting Lower Body Dressing: Total assistance Lower Body Dressing Details (indicate cue type and reason): Mod A sit<>stand Toilet Transfer: Moderate assistance;Stand-pivot Toilet Transfer Details (indicate cue type and reason): recliner to bed Toileting- Clothing Manipulation and Hygiene: Total assistance Toileting - Clothing Manipulation Details (indicate cue type and reason): Mod A sit<>stand             Vision Baseline Vision/History: Wears glasses Wears Glasses: Reading only Patient Visual Report: No change from baseline              Pertinent Vitals/Pain Pain Assessment: Faces Faces Pain Scale: Hurts little more Pain Location: bottom Pain Descriptors / Indicators: Discomfort;Sore Pain Intervention(s): Limited activity within patient's tolerance;Monitored during session;Repositioned     Hand Dominance Right   Extremity/Trunk Assessment Upper Extremity Assessment Upper Extremity Assessment: Generalized weakness           Communication Communication Communication: No difficulties   Cognition Arousal/Alertness: Awake/alert Behavior During Therapy: Flat affect Overall Cognitive Status: Impaired/Different from baseline Area of Impairment: Safety/judgement  Safety/Judgement: Decreased awareness of safety(would not turn completely around before he sat on EOB)                    [place] expects to be discharged to:: Private residence Living Arrangements: Spouse/significant other Available Help at Discharge: Family;Available 24 hours/day Type of Home: House Home Access: Stairs to enter [employer] of Steps: 1 Entrance Stairs-Rails: None Home Layout: One level     Bathroom Shower/Tub: Occupational psychologist: Standard     Home Equipment: Environmental consultant - 2 wheels;Cane - single point;Bedside commode;Shower seat      Lives With: Spouse    Prior Functioning/Environment Level of Independence: Needs assistance  Gait / Transfers Assistance Needed: uses RW for mobility ADL's / Homemaking Assistance Needed: assist from wife for LB ADL and wife does IADL            OT Problem List: Decreased strength;Decreased range of motion;Decreased activity tolerance;Impaired balance (sitting and/or standing);Obesity;Pain;Decreased safety awareness;Decreased knowledge of use of DME or AE      OT Treatment/Interventions: Self-care/ADL training;Balance training    OT Goals(Current goals can be found in the care plan section) Acute Rehab OT Goals Patient Stated Goal: to go home OT Goal Formulation: With patient Time For Goal Achievement: 04/05/19  OT Frequency: Min 2X/week              AM-PAC OT "6 Clicks" Daily Activity     Outcome Measure Help from another person eating meals?: A Little Help from another person taking care of personal grooming?: A Little Help from another person toileting, which includes using toliet, bedpan, or urinal?: A Lot Help from another person bathing (including washing, rinsing, drying)?: A Lot Help from another person to put on and taking off regular upper body clothing?: A Lot Help from another person to put on and taking off regular lower body clothing?: Total 6 Click Score: 13   End of Session Equipment Utilized During Treatment: Gait belt;Rolling walker;Oxygen(4 liters) Nurse Communication: (pt requesting  condom cath be reapplied--per RN pt has wound on penis that wound care has been consulted about so pt cannot have condom cath nor do can he leave a urinal between his legs due to wound as well)  Activity Tolerance: Patient limited by fatigue Patient left: in bed;with call bell/phone within reach;with bed alarm set  OT Visit Diagnosis: Unsteadiness on feet (R26.81);Other abnormalities of gait and mobility (R26.89);Muscle weakness (generalized) (M62.81);Pain Pain - part of body: (bottom)                Time: 1610-9604 OT Time Calculation (min): 22 min Charges:  OT General Charges $OT Visit: 1 Visit OT Evaluation $OT Eval Moderate Complexity: [place], OTR/L Acute [employer] Pager [phone] Office [phone]     Almon Register 03/22/2019, 4:19 PM

## 2019-03-22 NOTE — Progress Notes (Signed)
Per [name] verbal read back change patient Oxycodone-acetaminophen to BID. RN will update orders per MD request.

## 2019-03-22 NOTE — Progress Notes (Signed)
Rehab Admissions Coordinator Note:  Patient was screened by Michel Santee, PT, DPT for appropriateness for an Inpatient Acute Rehab Consult.  At this time, we are recommending Inpatient Rehab consult. I will contact MD for order.   Michel Santee 03/22/2019, 4:27 PM  I can be reached at [phone].

## 2019-03-22 NOTE — Progress Notes (Signed)
Patient ID: Nicholas Caldwell, male   DOB: 05/05/1946, 73 y.o.   MRN: [id number]     Advanced Heart Failure Rounding Note  PCP-Cardiologist: No primary care provider on file.   Subjective:    Now on milrinone 0.125 with co-ox 84%.  He had holiday off IV diuretics yesterday with rise in creatinine to 2.6, down to 2.3 today.  Unfortunately, CVP up to 20.   More awake today.  No dyspnea at rest. Shaky when gets out of bed.   He remains in NSR on Eliquis.  Hgb stable at 8.4.   MRI head with small chronic infarctions in cerebellum and small acute/early subacute infarctions left posterior frontal lobe.    Objective:   Weight Range: 123.2 kg Body mass index is 36.84 kg/m.   Vital Signs:   Temp:  [97.8 F (36.6 C)-98.7 F (37.1 C)] 98.4 F (36.9 C) (05/19 0745) Pulse Rate:  [73-82] 78 (05/19 0745) Resp:  [16-22] 16 (05/19 0745) BP: (88-121)/(47-73) 106/50 (05/19 0745) SpO2:  [93 %-100 %] 97 % (05/19 0855) Weight:  [123.2 kg] 123.2 kg (05/19 0500) Last BM Date: 03/21/19  Weight change: Filed Weights   03/19/19 0500 03/20/19 0300 03/22/19 0500  Weight: 122.1 kg 125.1 kg 123.2 kg    Intake/Output:   Intake/Output Summary (Last 24 hours) at 03/22/2019 1007 Last data filed at 03/22/2019 0602 Gross per 24 hour  Intake 733 ml  Output 2225 ml  Net -1492 ml      Physical Exam    General: NAD Neck: JVP 16+, no thyromegaly or thyroid nodule.  Lungs: Clear to auscultation bilaterally with normal respiratory effort. CV: Nondisplaced PMI.  Heart regular S1/S2, no S3/S4, no murmur.  Trace ankle edema.   Abdomen: Soft, nontender, no hepatosplenomegaly, no distention.  Skin: Intact without lesions or rashes.  Neurologic: Alert and oriented x 3.  Psych: Normal affect. Extremities: No clubbing or cyanosis.  HEENT: Normal.    Telemetry   NSR with frequent PACs in 80s (personally reviewed).   Labs    CBC Recent Labs    03/21/19 0500 03/22/19 0418  WBC 10.9* 10.8*  HGB 8.2*  8.4*  HCT 28.9* 29.7*  MCV 89.5 89.2  PLT 334 324   Basic Metabolic Panel Recent Labs    03/21/19 0500 03/22/19 0418  NA 137 139  K 4.8 3.8  CL 89* 91*  CO2 36* 37*  GLUCOSE 158* 49*  BUN 92* 93*  CREATININE 2.61* 2.39*  CALCIUM 8.6* 8.7*  MG 2.4 2.4   Liver Function Tests No results for input(s): AST, ALT, ALKPHOS, BILITOT, PROT, ALBUMIN in the last 72 hours. No results for input(s): LIPASE, AMYLASE in the last 72 hours. Cardiac Enzymes No results for input(s): CKTOTAL, CKMB, CKMBINDEX, TROPONINI in the last 72 hours.  BNP: BNP (last 3 results) Recent Labs    12/01/18 0015 12/30/18 1621 03/06/19 1829  BNP 1,666.7* 1,273.0* 1,020.1*    ProBNP (last 3 results) No results for input(s): PROBNP in the last 8760 hours.   D-Dimer No results for input(s): DDIMER in the last 72 hours. Hemoglobin A1C Recent Labs    03/22/19 0419  HGBA1C 6.8*   Fasting Lipid Panel No results for input(s): CHOL, HDL, LDLCALC, TRIG, CHOLHDL, LDLDIRECT in the last 72 hours. Thyroid Function Tests No results for input(s): TSH, T4TOTAL, T3FREE, THYROIDAB in the last 72 hours.  Invalid input(s): FREET3  Other results:   Imaging    No results found.   Medications:  Scheduled Medications: . apixaban  5 mg Oral BID  . arformoterol  15 mcg Nebulization BID  . bisoprolol  2.5 mg Oral Daily  . budesonide (PULMICORT) nebulizer solution  0.5 mg Nebulization BID  . docusate sodium  100 mg Oral Daily  . ferrous sulfate  325 mg Oral BID WC  . folic acid  1 mg Oral Daily  . gabapentin  600 mg Oral BID  . Gerhardt's butt cream   Topical Daily  . guaiFENesin  400 mg Oral BID  . insulin aspart  0-20 Units Subcutaneous TID WC  . insulin aspart  0-5 Units Subcutaneous QHS  . insulin aspart  10 Units Subcutaneous TID WC  . insulin NPH Human  30 Units Subcutaneous BID AC & HS  . ipratropium-albuterol  3 mL Nebulization TID  . isosorbide-hydrALAZINE  1 tablet Oral TID  . latanoprost   1 drop Both Eyes QHS  . mouth rinse  15 mL Mouth Rinse BID  . metolazone  5 mg Oral Once  . nicotine  7 mg Transdermal Daily  . pantoprazole  40 mg Oral BID  . polyethylene glycol  17 g Oral Daily  . potassium chloride  40 mEq Oral BID  . potassium chloride  40 mEq Oral Once  . rosuvastatin  20 mg Oral q1800  . sodium chloride flush  10-40 mL Intracatheter Q12H  . sodium chloride flush  3 mL Intravenous Q12H  . spironolactone  25 mg Oral Daily    Infusions: . sodium chloride    . furosemide    . milrinone 0.125 mcg/kg/min (03/21/19 1830)    PRN Medications: sodium chloride, acetaminophen, acetylcysteine, alum & mag hydroxide-simeth, diazepam, fluticasone, hydrocortisone cream, ondansetron (ZOFRAN) IV, oxyCODONE-acetaminophen, sodium chloride flush, sodium chloride flush   Assessment/Plan   1. Acute on chronic systolic CHF: Nonischemic cardiomyopathy.  With prominent RV failure, likely related to COPD/OSA/OHS. TEE in 3/20 with EF 40-45%, severely dilated RV with moderately decreased systolic function. He has had multiple recent admissions with CHF and COPD exacerbations.  He has not cooperated with paramedicine or [facility] home [facility] as outpatient, has not been using CPAP per his wife, suspect significant medication noncompliance as well though he denies.  Milrinone started 5/9 for RV support. Now on 0.125 with co-ox 84%.  Diuretics switched to po yesterday, creatinine lower at 2.3 but CVP up to 20.  With RV failure, suspect we will not get CVP to normal range but still markedly volume overloaded. Despite a lot of diuresis, weight is not down much.  I am concerned that his po fluid intake is greater than it appears.  - Continue milrinone at 0.125 for now.  - Stop po torsemide, restart IV Lasix.  Will use 120 mg IV bid and will add a dose of metolazone 5 mg x 1. Replace K.  - Continue UNNA boots - Continue bisoprolol 2.5 daily.  - Continue Bidil 1 tab tid.  - Continue spironolactone 25  mg daily.    - Fluid restrict.  - Cardiomems may be helpful as outpatient.  - I think his long-term prognosis, unfortunately, will be poor with significant RV dysfunction and history of poor compliance.  2. Anemia: Baseline hgb around 8.  6.7 at admission, denies overt GI bleeding.  He has had 2 unit PRBCs and IV Fe.  Hgb 7.4 today, lower but no overt bleeding.  Colonoscopy 5/8 unremarkable but EGD showed necrotic mucosa gastric fundus/body that is concerning for gastric cancer, biopsies taken which were negative  for malignancy. Hgb fairly stable at 8.2 today.  - Continue Eliquis - Has received IV iron per primary team and GI 3. Atrial fibrillation/flutter: Patient has history of paroxysmal atrial fibrillation.  This admission, he appears to be in an atypical atrial flutter.  Rate is currently controlled. Xarelto initially held with GI bleeding.  Was seen in past by Dr. Rayann Heman, not thought to be good ablation candidate.  He has not been on Tikosyn or amiodarone due to history of long QT. Converted back to NSR on 5/16, in NSR today with PACs.  - Will eventually ask EP to reconsider ablation given lack of good anti-arrhythmic options. He seems to do better in NSR.  - Continue Eliquis.  4. COPD: Severe, on 3L home oxygen.  He was wheezing initially but now much improved.  This may be due to pulmonary edema, but also consider component of COPD exacerbation.  - Per primary team  5. OHS/OSA: On home oxygen.  Per wife, has not been using CPAP at home.  Will give CPAP at night here.  6. ID: Suspect this is primarily a CHF exacerbation.  Afebrile and WBCs not significantly elevated.  1 blood culture positive for what appears to be coagulase negative Staph (probably contaminant).  COVID-19 negative.  7. CAD: Nonobstructive on prior cath.  No s/s ischemia. He is on Crestor. Off ASA with DOAC and GI bleed  8. Acute on chronic kidney failure III: Baseline seems to be 1.3-1.6.  Creatinine up to 2.6 today.  -  Cutting back on diuretics as above, think we will have to accept a somewhat elevated CVP.  9. DM2 with poor control - TRH managing  10. CVA: MRI head showed both small acute/subacute and old CVAs.  Neuro has seen, suspect related to atrial fibrillation.  - He has started on Eliquis (was on Xarelto prior).   Length of Stay: [place], [place]  03/22/2019, 10:07 AM  Advanced Heart Failure Team Pager [phone] (M-F; 7a - 4p)  Please contact [facility] for night-coverage after hours (4p -7a ) and weekends on [email or web address]

## 2019-03-22 NOTE — Progress Notes (Signed)
Per request of patient secretary took [employer] ending [phone] to the [place] tower entrance and provided to wife Tavious Griesinger.  Ms. Castille provided ID to confirm identity.

## 2019-03-22 NOTE — Progress Notes (Signed)
PROGRESS NOTE    Nicholas Caldwell  PVX:[id number] DOB: 1946-04-24 DOA: 03/06/2019 PCP: [facility]   Brief Narrative:   Nicholas Caldwell is a 73 y.o. M with CAD, isch CM EF 40%, sdCHF, pAF on Xarelto, pHTN, MO, OSA not compliant with CPAP, COPD on home O2 3L, and smoking who presented with swelling and dyspnea.  Patient had had slow progressive dyspnea, SOB with exertion, also significant peripheral edema.  In ER, BNP >1000, CXR with edema, HR 124 in Afib, and new anemia Hgb 6.7 g/dL. Transfused and started on Lasix.   Assessment & Plan:   Principal Problem:   Acute on chronic respiratory failure with hypoxia and hypercapnia (HCC) Active Problems:   Essential hypertension   Type 2 diabetes mellitus with neuropathy   PAF (paroxysmal atrial fibrillation) (HCC)   COPD (chronic obstructive pulmonary disease) (HCC)   Medically noncompliant   Atrial fibrillation with RVR (HCC)   AKI (acute kidney injury) ([place])   Acute on chronic anemia   Acute on chronic systolic (congestive) heart failure (HCC)   Acute on chronic systolic CHF (congestive heart failure) (HCC)   Iron deficiency anemia   NSVT (nonsustained ventricular tachycardia) (HCC)   Tobacco abuse counseling   Gastritis and gastroduodenitis   Cerebral embolism with cerebral infarction   Acute versus subacute ischemic stroke, likely cardioembolic Overnight, patient was apparently confused and nonverbal for a few seconds.  Patient states he was angry at nurses and was just not communicating.  Rapid response was initiated with a CT head notable for small bilateral cerebellar infarcts, question chronic versus acute.  EEG with no seizure activity.  MR brain notable for small group of left frontal acute versus early subacute strokes in the setting of a flutter/atrial fibrillation. --Neurology following, appreciate assistance --Lipid panel 5/2 w/ TC 83, LDL 18, HDL 43, TG 109 --HbA1c 6.07 Nov 2018; repeating in a.m. --Bilateral carotid  Dopplers with no significant findings --continue crestor --Continue anticoagulation with Eliquis --continue to monitor on telemetry --Continue home gabapentin  Acute on chronic hypoxic respiratory failure: resolved Patient presenting from home with progressive dyspnea associated with worsening lower extremity edema.  Likely multifactorial from acute CHF exacerbation and COPD flare, as well as anemia, NSVT/AF.  Chest x-ray on admission notable for bilateral interstitial and alveolar airspace opacities with cardiomegaly. --Now titrated down to home O2 at 3 L per nasal cannula --Continue supportive care  Acute on chronic systolic and diastolic CHF EF 48-27% on TEE in Mar.   --Cardiology/HF team following, appreciate assistance --Net negative 1.7L yesterday; net negative 27.5L since admission --Transitioned metolazone, Acetazolamide and Lasix gtt to torsemide 38m PO BID on 5/18 by cards due to increase in creatinine --continues on milrinone gtt, per cards --Continue Crestor --Continue BB --Cardiac, low-salt diet, fluid restrict to 1500 mL's per day  Atrial fibrillation/atypical aflutter Continues with persistent A. fib/flutter on telemetry.  Cardiology following, appreciate assistance.  Has been discussed with EP, not a optimal ablation candidate, may consider in the future. --Started on heparin drip on 03/15/2019; transitioned to Eliquis 5/14 --Hgb stable, 8.2 today --continue beta-blocker --continues in NSR since 5/16 --Continue to monitor on telemetry  Acute kidney injury Creatinine baseline 1.2 - 1.6 for the last 6 months roughly, trending up from 0.9 last few years.  AKI again this hospitalization, has been increasing last few days with diuresis. --Cr 2.25-->2.10-->2.06-->1.98-->1.94-->1.88-->2.61-->2.39 --continue p.o. torsemide 80 mg twice daily per cards --Daily BMP  Iron deficiency anemia Acute blood loss anemia Upper GI bleed Suspected  chronic GI blood loss from gastric  ulcer.  Received 2 units PRBCs during the hospitalization. EGD 5/12 with multiple nonbleeding erosions, one nonbleeding superficial gastric ulcer with clean ulcer base --Pathology from stomach biopsy negative for metaplasia, dysplasia or malignancy, no H. pylori --s/p Feraheme 5/4, 5/12 --Continue protonix 39m BID x 12 weeks --Minimize NSAID use --Hgb stable 8.6-->8.4-->8.2-->8.4, no signs of bleeding --Continue to monitor daily CBC, especially now on Eliquis --GI recommends outpatient follow-up and consideration of gastric mapping in the future as an outpatient; will need follow-up with Dr. CBryan Lemma  Acute COPD exacerbation Patient with home O2 dependence at 3 L per nasal cannula. --completed course of steroids and doxycycline --Pulmicort/Brovana --Continue nebs prn --Chest PT, Mucinex, Mucomyst prn --Continue to titrate supplemental oxygen to maintain SPO2 greater than 88%  Positive blood culture CoNS in 1/2.  Likely contaminant.  Hypokalemia -K 3.8 today,  --Continue potassium 40 mEq twice daily  --Repeat electrolytes with magnesium in the a.m.  Diabetes Hemoglobin A1c 6.5 on 11/13/2018.  Home regimen includes metformin 1000 mg p.o. twice daily, NPH 70-30 5u BID.  Glucose poorly controlled during hospitalization, likely exacerbated by need of IV steroids for COPD exacerbation as above.  Also with likely dietary noncompliance. --Overnight, glucose dropped to 49. --NPH decreased from 40u to 30u SQ BID --Novolog decreased from 18u to 10u TIDAC --ISS for coverage --CBG's QACHS, continue to monitor and adjust insulin regimen as needed  Hypertension Coronary disease secondary prevention -Continue Crestor, BB  Paroxysmal atrial fibrillation NSVT CHA2DS2-Vasc = 4 (Age, CHF, PVD, DM).   -continue anticoagulation with Eliquis -Continue BB  OHS/OSA/MO --Noncompliant with CPAP at home per report, but has been compliant while inpatient --Continue to encourage compliance with  CPAP nocturnally   DVT prophylaxis: Eliquis Code Status: FULL CODE Family Communication: none Disposition Plan: inpatient, continues on milrinone drip.  PT recommends home health PT when medically ready versus may need SNF if regresses.   Consultants:   Cardiology  Neurology - signed off 03/21/2019  Procedures:   EGD 03/15/2019  Antimicrobials:   Doxycycline   Subjective: Patient seen and examined at bedside, resting comfortably.  Very tired this morning.  Blood glucose noted to be 49 on BMP.  Reduced insulin regimen today.  Continues on milrinone drip.  Diuretics were transitioned from IV to oral by cardiology yesterday.  Continues to be in normal sinus rhythm on telemetry.  Continues with good diuresis, net negative 1.5 L past 24 hours.  Creatinine improving.  No other complaints at this time. Denies headache, no chest pain, no shortness of breath from his normal baseline, no palpitations, no abdominal pain, no weakness, no cough/congestion, no fever/chills/night sweats, no paresthesias.  No other acute events overnight per nursing staff.  Objective: Vitals:   03/22/19 0403 03/22/19 0500 03/22/19 0745 03/22/19 0855  BP: (!) 121/54  (!) 106/50   Pulse: 78  78   Resp: 18  16   Temp: 98.5 F (36.9 C)  98.4 F (36.9 C)   TempSrc: Axillary  Oral   SpO2: 94%  100% 97%  Weight:  123.2 kg    Height:        Intake/Output Summary (Last 24 hours) at 03/22/2019 0953 Last data filed at 03/22/2019 0602 Gross per 24 hour  Intake 733 ml  Output 2225 ml  Net -1492 ml   Filed Weights   03/19/19 0500 03/20/19 0300 03/22/19 0500  Weight: 122.1 kg 125.1 kg 123.2 kg    Examination:  General exam: Appears calm  and comfortable, obese Respiratory system: Breath sounds slightly decreased bilateral bases with crackles, normal respiratory effort on 3 L nasal cannula Cardiovascular system: S1 & S2 heard, RRR,. No JVD, murmurs, rubs, gallops or clicks.  2+ pedal edema  bilaterally Gastrointestinal system: Abdomen is nondistended, soft and nontender. No organomegaly or masses felt. Normal bowel sounds heard. Central nervous system: Alert and oriented. No focal neurological deficits. Extremities: Symmetric 5 x 5 power. Skin: No rashes, lesions or ulcers Psychiatry: Judgement and insight appear normal. Mood & affect appropriate.     Data Reviewed: I have personally reviewed following labs and imaging studies  CBC: Recent Labs  Lab 03/19/19 0500 03/20/19 0500 03/20/19 1037 03/21/19 0500 03/22/19 0418  WBC 11.8* 9.4 11.5* 10.9* 10.8*  HGB 8.6* 7.4* 8.5* 8.2* 8.4*  HCT 30.2* 26.2* 29.3* 28.9* 29.7*  MCV 89.1 90.0 89.1 89.5 89.2  PLT 312 289 344 334 263   Basic Metabolic Panel: Recent Labs  Lab 03/18/19 0700 03/19/19 0500 03/20/19 0500 03/20/19 0739 03/20/19 1037 03/21/19 0500 03/22/19 0418  NA 137 135 124*  --  135 137 139  K 3.2* 3.8 4.0  --  4.4 4.8 3.8  CL 91* 85* 80*  --  89* 89* 91*  CO2 35* 35* 32  --  34* 36* 37*  GLUCOSE 129* 343* 571* 113* 233* 158* 49*  BUN 71* 72* 69*  --  80* 92* 93*  CREATININE 1.98* 1.94* 1.88*  --  2.11* 2.61* 2.39*  CALCIUM 8.1* 8.7* 7.7*  --  8.5* 8.6* 8.7*  MG 2.0 2.1 1.9  --   --  2.4 2.4   GFR: Estimated Creatinine Clearance: 39 mL/min (A) (by C-G formula based on SCr of 2.39 mg/dL (H)). Liver Function Tests: No results for input(s): AST, ALT, ALKPHOS, BILITOT, PROT, ALBUMIN in the last 168 hours. No results for input(s): LIPASE, AMYLASE in the last 168 hours. No results for input(s): AMMONIA in the last 168 hours. Coagulation Profile: No results for input(s): INR, PROTIME in the last 168 hours. Cardiac Enzymes: No results for input(s): CKTOTAL, CKMB, CKMBINDEX, TROPONINI in the last 168 hours. BNP (last 3 results) No results for input(s): PROBNP in the last 8760 hours. HbA1C: Recent Labs    03/22/19 0419  HGBA1C 6.8*   CBG: Recent Labs  Lab 03/21/19 1516 03/21/19 1754 03/21/19 2210  03/22/19 0625 03/22/19 0929  GLUCAP 231* 240* 234* 52* 116*   Lipid Profile: No results for input(s): CHOL, HDL, LDLCALC, TRIG, CHOLHDL, LDLDIRECT in the last 72 hours. Thyroid Function Tests: No results for input(s): TSH, T4TOTAL, FREET4, T3FREE, THYROIDAB in the last 72 hours. Anemia Panel: No results for input(s): VITAMINB12, FOLATE, FERRITIN, TIBC, IRON, RETICCTPCT in the last 72 hours. Sepsis Labs: No results for input(s): PROCALCITON, LATICACIDVEN in the last 168 hours.  No results found for this or any previous visit (from the past 240 hour(s)).       Radiology Studies: Mr Brain 38 Contrast  Result Date: 03/20/2019 CLINICAL DATA:  73 y/o M; altered level of consciousness, unexplained. Abnormal CT head. EXAM: MRI HEAD WITHOUT CONTRAST TECHNIQUE: Multiplanar, multiecho pulse sequences of the brain and surrounding structures were obtained without intravenous contrast. COMPARISON:  03/18/2019 CT head FINDINGS: Brain: Small group of subcentimeter foci of reduced diffusion within left posterior frontal lobe subcortical white matter compatible with acute/early subacute infarction. No associated hemorrhage or mass effect. Very small chronic infarctions are present within the bilateral cerebellar hemispheres. Early confluent nonspecific T2 FLAIR hyperintensities in subcortical and  periventricular white matter as well as the pons are compatible with moderate chronic microvascular ischemic changes. Mild volume loss of the brain. Cavum septum pellucidum. Vascular: Normal flow voids. Skull and upper cervical spine: Normal marrow signal. Sinuses/Orbits: Negative. Other: None. IMPRESSION: 1. Small group of subcentimeter acute/early subacute infarctions within the left posterior frontal lobe subcortical white matter. No associated hemorrhage or mass effect. 2. Moderate chronic microvascular ischemic changes and mild volume loss of the brain. Very small chronic infarctions within the bilateral  cerebellar hemispheres. These results will be called to the ordering clinician or representative by the Radiologist Assistant, and communication documented in the PACS or zVision Dashboard. Electronically Signed   By: Kristine Garbe M.D.   On: 03/20/2019 22:38   Vas US Carotid (at [place] Only)  Result Date: 03/22/2019 Carotid Arterial Duplex Study Indications:       CVA. Risk Factors:      Hypertension, hyperlipidemia, Diabetes. Other Factors:     Atrial fibrillation, COPD, OSA, CHF, NICM. Limitations:       Body habitus, Atrial fibrillation Comparison Study:  No prior study on file for comparison Performing Technologist: Sharion Dove RVS  Examination Guidelines: A complete evaluation includes B-mode imaging, spectral Doppler, color Doppler, and power Doppler as needed of all accessible portions of each vessel. Bilateral testing is considered an integral part of a complete examination. Limited examinations for reoccurring indications may be performed as noted.  Right Carotid Findings: +----------+--------+--------+--------+--------+------------------+             PSV cm/s EDV cm/s Stenosis Describe Comments            +----------+--------+--------+--------+--------+------------------+  CCA Prox   89       33                         intimal thickening  +----------+--------+--------+--------+--------+------------------+  CCA Distal 72       20                         intimal thickening  +----------+--------+--------+--------+--------+------------------+  ICA Prox   63       22                                             +----------+--------+--------+--------+--------+------------------+  ICA Distal 94       29                                             +----------+--------+--------+--------+--------+------------------+  ECA        76       12                                             +----------+--------+--------+--------+--------+------------------+  +----------+--------+-------+--------+-------------------+             PSV cm/s EDV cms Describe Arm Pressure (mmHG)  +----------+--------+-------+--------+-------------------+  Subclavian 112                                            +----------+--------+-------+--------+-------------------+ +---------+--------+--+--------+--+  Vertebral PSV cm/s 62 EDV cm/s 23  +---------+--------+--+--------+--+  Left Carotid Findings: +----------+--------+--------+--------+--------+------------------+             PSV cm/s EDV cm/s Stenosis Describe Comments            +----------+--------+--------+--------+--------+------------------+  CCA Prox   87       31                         intimal thickening  +----------+--------+--------+--------+--------+------------------+  CCA Distal 87       34                         intimal thickening  +----------+--------+--------+--------+--------+------------------+  ICA Prox   84       29                                             +----------+--------+--------+--------+--------+------------------+  ICA Distal 75       30                                             +----------+--------+--------+--------+--------+------------------+  ECA        99       13                                             +----------+--------+--------+--------+--------+------------------+ +----------+--------+--------+--------+-------------------+  Subclavian PSV cm/s EDV cm/s Describe Arm Pressure (mmHG)  +----------+--------+--------+--------+-------------------+             96                                              +----------+--------+--------+--------+-------------------+ +---------+--------+--+--------+--+  Vertebral PSV cm/s 44 EDV cm/s 19  +---------+--------+--+--------+--+  Summary: Right Carotid: The extracranial vessels were near-normal with only minimal wall                thickening or plaque. Left Carotid: The extracranial vessels were near-normal with only minimal wall               thickening  or plaque. Vertebrals:  Bilateral vertebral arteries demonstrate antegrade flow. Subclavians: Normal flow hemodynamics were seen in bilateral subclavian              arteries. *See table(s) above for measurements and observations.  Electronically signed by Antony Contras MD on 03/22/2019 at 8:41:35 AM.    Final         Scheduled Meds:  apixaban  5 mg Oral BID   arformoterol  15 mcg Nebulization BID   bisoprolol  2.5 mg Oral Daily   budesonide (PULMICORT) nebulizer solution  0.5 mg Nebulization BID   docusate sodium  100 mg Oral Daily   ferrous sulfate  325 mg Oral BID WC   folic acid  1 mg Oral Daily   gabapentin  600 mg Oral BID   Gerhardt's butt cream   Topical Daily   guaiFENesin  400 mg Oral BID   insulin aspart  0-20 Units Subcutaneous TID WC   insulin aspart  0-5 Units Subcutaneous QHS   insulin aspart  10 Units Subcutaneous TID WC   insulin NPH Human  30 Units Subcutaneous BID AC & HS   ipratropium-albuterol  3 mL Nebulization TID   isosorbide-hydrALAZINE  1 tablet Oral TID   latanoprost  1 drop Both Eyes QHS   mouth rinse  15 mL Mouth Rinse BID   nicotine  7 mg Transdermal Daily   pantoprazole  40 mg Oral BID   polyethylene glycol  17 g Oral Daily   potassium chloride  40 mEq Oral BID   rosuvastatin  20 mg Oral q1800   sodium chloride flush  10-40 mL Intracatheter Q12H   sodium chloride flush  3 mL Intravenous Q12H   spironolactone  25 mg Oral Daily   torsemide  80 mg Oral BID   Continuous Infusions:  sodium chloride     milrinone 0.125 mcg/kg/min (03/21/19 1830)     LOS: 16 days    Time spent: 36 minutes    Eusebia Grulke J [place] DO [facility] Pager [phone]  If 7PM-7AM, please contact night-coverage [email or web address] Password [facility] 03/22/2019, 9:53 AM

## 2019-03-22 NOTE — Consult Note (Signed)
[place] Nurse wound consult note Patient receiving care in [facility] 2C05.  The [place] consult was completed remotely via a telephone discussion with the patient's primary care RN, [place]. Reason for Consult: penis wound Wound type: The patient recently wore a condom catheter, and was repeatedly pulling the catheter off, causing trauma to the penile shaft.  The penis is retracted almost completely. Measurement: There is a circumferential, narrow band of red/pink/raw area towards the base of the penis on the shaft. Drainage (amount, consistency, odor) no drainage, no odor Periwound: intact. Dressing procedure/placement/frequency: 40% desitin to the affected area after cleansing, twice daily. Monitor the wound area(s) for worsening of condition such as: Signs/symptoms of infection,  Increase in size,  Development of or worsening of odor, Development of pain, or increased pain at the affected locations.  Notify the medical team if any of these develop.  Thank you for the consult.  Discussed plan of care with the bedside nurse.  [place] nurse will not follow at this time.  Please re-consult the [place] team if needed.  Val Riles, RN, MSN, CWOCN, CNS-BC, pager [phone]

## 2019-03-22 NOTE — Progress Notes (Signed)
Inpatient Diabetes Program Recommendations  AACE/ADA: New Consensus Statement on Inpatient Glycemic Control (2015)  Target Ranges:  Prepandial:   less than 140 mg/dL      Peak postprandial:   less than 180 mg/dL (1-2 hours)      Critically ill patients:  140 - 180 mg/dL   Lab Results  Component Value Date   GLUCAP 207 (H) 03/22/2019   HGBA1C 6.8 (H) 03/22/2019    Review of Glycemic Control Results for Nicholas, Caldwell (MRN [id number]) as of 03/22/2019 11:50  Ref. Range 03/21/2019 22:10 03/22/2019 06:25 03/22/2019 09:29 03/22/2019 11:40  Glucose-Capillary Latest Ref Range: 70 - 99 mg/dL 234 (H) 52 (L) 116 (H) 207 (H)   Outpatient DM medications: 70/30 30 units BID, Metformin XR 1000 mg BID Current orders for Inpatient glycemic control: NPH 30 units BID, Novolog 10 units TID with meals, Novolog 0-20 units TID with meals, Novolog 0-5 units QHS;   Inpatient Diabetes Program Recommendations: :   Noted patient experienced hypoglycemia this AM of 49 mg/dL. On 5/18, patient received lunchtime dose of meal coverage totaling 25 units of Novolog + an additional 7 units of Novolog within 2 hours. Doses MUST given 4 hours apart. This may have contributed. Appears that NPH dose was held in response to low CBG.  Reached out to RN to ensure that she plans to administer NPH as trends are increasing. Dose has been decreased.   Consider further reduction in correction to Novolog 0-15 units TID.  Thanks, Bronson Curb, MSN, RNC-OB Diabetes Coordinator [phone] (8a-5p)

## 2019-03-22 NOTE — Progress Notes (Signed)
Patient unable to ambulate in hall. Very unsteady on feet. Two assist from bed to chair. OT attempted to mobilize patient unable to stand; dangled at the edge of bed.

## 2019-03-23 DIAGNOSIS — R5381 Other malaise: Secondary | ICD-10-CM

## 2019-03-23 LAB — GLUCOSE, CAPILLARY
Glucose-Capillary: 51 mg/dL — ABNORMAL LOW (ref 70–99)
Glucose-Capillary: 207 mg/dL — ABNORMAL HIGH (ref 70–99)
Glucose-Capillary: 254 mg/dL — ABNORMAL HIGH (ref 70–99)
Glucose-Capillary: 103 mg/dL — ABNORMAL HIGH (ref 70–99)
Glucose-Capillary: 201 mg/dL — ABNORMAL HIGH (ref 70–99)

## 2019-03-23 LAB — BASIC METABOLIC PANEL
Anion gap: 12 (ref 5–15)
BUN: 88 mg/dL — ABNORMAL HIGH (ref 8–23)
CO2: 35 mmol/L — ABNORMAL HIGH (ref 22–32)
Calcium: 8.6 mg/dL — ABNORMAL LOW (ref 8.9–10.3)
Chloride: 92 mmol/L — ABNORMAL LOW (ref 98–111)
Creatinine, Ser: 2.12 mg/dL — ABNORMAL HIGH (ref 0.61–1.24)
GFR calc Af Amer: 35 mL/min — ABNORMAL LOW (ref >60)
GFR calc non Af Amer: 31 mL/min — ABNORMAL LOW (ref >60)
Glucose, Bld: 54 mg/dL — ABNORMAL LOW (ref 70–99)
Potassium: 4.3 mmol/L (ref 3.5–5.1)
Sodium: 139 mmol/L (ref 135–145)

## 2019-03-23 LAB — MAGNESIUM: Magnesium: 2.4 mg/dL (ref 1.7–2.4)

## 2019-03-23 LAB — CBC
HCT: 27.7 % — ABNORMAL LOW (ref 39.0–52.0)
Hemoglobin: 8.1 g/dL — ABNORMAL LOW (ref 13.0–17.0)
MCH: 25.9 pg — ABNORMAL LOW (ref 26.0–34.0)
MCHC: 29.2 g/dL — ABNORMAL LOW (ref 30.0–36.0)
MCV: 88.5 fL (ref 80.0–100.0)
Platelets: 364 10*3/uL (ref 150–400)
RBC: 3.13 MIL/uL — ABNORMAL LOW (ref 4.22–5.81)
RDW: 25.1 % — ABNORMAL HIGH (ref 11.5–15.5)
WBC: 9.5 10*3/uL (ref 4.0–10.5)
nRBC: 0 % (ref 0.0–0.2)

## 2019-03-23 LAB — COOXEMETRY PANEL
Carboxyhemoglobin: 2.2 % — ABNORMAL HIGH (ref 0.5–1.5)
Methemoglobin: 1.8 % — ABNORMAL HIGH (ref 0.0–1.5)
O2 Saturation: 76.7 %
Total hemoglobin: 7.8 g/dL — ABNORMAL LOW (ref 12.0–16.0)

## 2019-03-23 MED ORDER — ISOSORB DINITRATE-HYDRALAZINE 20-37.5 MG PO TABS
0.5000 | ORAL_TABLET | Freq: Three times a day (TID) | ORAL | Status: DC
  Administered 2019-03-23 – 2019-03-31 (×24): 0.5 via ORAL
  Filled 2019-03-23 (×24): qty 1

## 2019-03-23 MED ORDER — INSULIN ASPART 100 UNIT/ML ~~LOC~~ SOLN
0.0000 [IU] | Freq: Three times a day (TID) | SUBCUTANEOUS | Status: DC
  Administered 2019-03-23: 8 [IU] via SUBCUTANEOUS
  Administered 2019-03-24: 3 [IU] via SUBCUTANEOUS
  Administered 2019-03-25: 5 [IU] via SUBCUTANEOUS
  Administered 2019-03-25 – 2019-03-26 (×2): 3 [IU] via SUBCUTANEOUS
  Administered 2019-03-27 (×2): 2 [IU] via SUBCUTANEOUS
  Administered 2019-03-28 – 2019-03-30 (×6): 3 [IU] via SUBCUTANEOUS
  Administered 2019-03-30: 2 [IU] via SUBCUTANEOUS
  Administered 2019-03-31: 3 [IU] via SUBCUTANEOUS

## 2019-03-23 MED ORDER — FUROSEMIDE 10 MG/ML IJ SOLN
20.0000 mg/h | INTRAVENOUS | Status: DC
  Administered 2019-03-23 – 2019-03-26 (×4): 15 mg/h via INTRAVENOUS
  Administered 2019-03-28 – 2019-03-29 (×3): 20 mg/h via INTRAVENOUS
  Filled 2019-03-23: qty 20
  Filled 2019-03-23 (×11): qty 25

## 2019-03-23 MED ORDER — INSULIN NPH (HUMAN) (ISOPHANE) 100 UNIT/ML ~~LOC~~ SUSP
25.0000 [IU] | Freq: Two times a day (BID) | SUBCUTANEOUS | Status: DC
  Administered 2019-03-23: 25 [IU] via SUBCUTANEOUS

## 2019-03-23 MED ORDER — HYDROCORTISONE ACETATE 25 MG RE SUPP
25.0000 mg | Freq: Two times a day (BID) | RECTAL | Status: DC
  Administered 2019-03-23 – 2019-03-29 (×10): 25 mg via RECTAL
  Filled 2019-03-23 (×22): qty 1

## 2019-03-23 MED ORDER — METOLAZONE 5 MG PO TABS
5.0000 mg | ORAL_TABLET | Freq: Once | ORAL | Status: AC
  Administered 2019-03-23: 5 mg via ORAL
  Filled 2019-03-23: qty 1

## 2019-03-23 MED ORDER — INSULIN NPH (HUMAN) (ISOPHANE) 100 UNIT/ML ~~LOC~~ SUSP
20.0000 [IU] | Freq: Two times a day (BID) | SUBCUTANEOUS | Status: DC
  Administered 2019-03-23 – 2019-03-31 (×16): 20 [IU] via SUBCUTANEOUS

## 2019-03-23 MED ORDER — LACTULOSE 10 GM/15ML PO SOLN
20.0000 g | Freq: Once | ORAL | Status: AC
  Administered 2019-03-23: 20 g via ORAL
  Filled 2019-03-23: qty 30

## 2019-03-23 MED ORDER — INSULIN ASPART 100 UNIT/ML ~~LOC~~ SOLN
0.0000 [IU] | Freq: Every day | SUBCUTANEOUS | Status: DC
  Administered 2019-03-23 – 2019-03-24 (×2): 2 [IU] via SUBCUTANEOUS
  Administered 2019-03-25 – 2019-03-26 (×2): 4 [IU] via SUBCUTANEOUS

## 2019-03-23 MED ORDER — DOCUSATE SODIUM 100 MG PO CAPS
100.0000 mg | ORAL_CAPSULE | Freq: Two times a day (BID) | ORAL | Status: DC
  Administered 2019-03-23 – 2019-03-25 (×4): 100 mg via ORAL
  Filled 2019-03-23 (×5): qty 1

## 2019-03-23 MED ORDER — INSULIN ASPART 100 UNIT/ML ~~LOC~~ SOLN
8.0000 [IU] | Freq: Three times a day (TID) | SUBCUTANEOUS | Status: DC
  Administered 2019-03-23 – 2019-03-31 (×24): 8 [IU] via SUBCUTANEOUS

## 2019-03-23 NOTE — Progress Notes (Signed)
Patient ID: Nicholas Caldwell, male   DOB: 11-Aug-1946, 73 y.o.   MRN: [id number]     Advanced Heart Failure Rounding Note  PCP-Cardiologist: No primary care provider on file.   Subjective:    Now on milrinone 0.125 with co-ox 77%.  He got Lasix 120 mg IV bid yesterday, I/Os even and weight up.  CVP remains 18. BUN/creatinine improved to 88/2.12.   Not short of breath at rest, reports low back pain and abdominal discomfort.  Abdomen more distended.   He remains in NSR on Eliquis but has short runs of atrial fibrillation with RVR.  Hgb 8.4 => 8.1.   MRI head with small chronic infarctions in cerebellum and small acute/early subacute infarctions left posterior frontal lobe.    Objective:   Weight Range: 125.8 kg Body mass index is 37.61 kg/m.   Vital Signs:   Temp:  [98.5 F (36.9 C)-99.1 F (37.3 C)] 98.5 F (36.9 C) (05/20 0743) Pulse Rate:  [57-121] 78 (05/20 0743) Resp:  [17-33] 17 (05/20 0347) BP: (101-110)/(49-59) 109/58 (05/20 0743) SpO2:  [91 %-100 %] 97 % (05/20 0743) Weight:  [125.8 kg] 125.8 kg (05/20 0500) Last BM Date: 03/22/19  Weight change: Filed Weights   03/20/19 0300 03/22/19 0500 03/23/19 0500  Weight: 125.1 kg 123.2 kg 125.8 kg    Intake/Output:   Intake/Output Summary (Last 24 hours) at 03/23/2019 0955 Last data filed at 03/23/2019 0743 Gross per 24 hour  Intake 1160.9 ml  Output 1100 ml  Net 60.9 ml      Physical Exam    General: NAD Neck: JVP 14-16, no thyromegaly or thyroid nodule.  Lungs: Clear to auscultation bilaterally with normal respiratory effort. CV: Nondisplaced PMI.  Heart regular S1/S2, no S3/S4, no murmur.  Trace ankle edema.   Abdomen: Soft, nontender, no hepatosplenomegaly, mild distention.  Skin: Intact without lesions or rashes.  Neurologic: Alert and oriented x 3.  Psych: Normal affect. Extremities: No clubbing or cyanosis.  HEENT: Normal.    Telemetry   NSR with short runs of atrial fibrillation (personally reviewed).    Labs    CBC Recent Labs    03/22/19 0418 03/23/19 0454  WBC 10.8* 9.5  HGB 8.4* 8.1*  HCT 29.7* 27.7*  MCV 89.2 88.5  PLT 372 122   Basic Metabolic Panel Recent Labs    03/22/19 0418 03/23/19 0454  NA 139 139  K 3.8 4.3  CL 91* 92*  CO2 37* 35*  GLUCOSE 49* 54*  BUN 93* 88*  CREATININE 2.39* 2.12*  CALCIUM 8.7* 8.6*  MG 2.4 2.4   Liver Function Tests No results for input(s): AST, ALT, ALKPHOS, BILITOT, PROT, ALBUMIN in the last 72 hours. No results for input(s): LIPASE, AMYLASE in the last 72 hours. Cardiac Enzymes No results for input(s): CKTOTAL, CKMB, CKMBINDEX, TROPONINI in the last 72 hours.  BNP: BNP (last 3 results) Recent Labs    12/01/18 0015 12/30/18 1621 03/06/19 1829  BNP 1,666.7* 1,273.0* 1,020.1*    ProBNP (last 3 results) No results for input(s): PROBNP in the last 8760 hours.   D-Dimer No results for input(s): DDIMER in the last 72 hours. Hemoglobin A1C Recent Labs    03/22/19 0419  HGBA1C 6.8*   Fasting Lipid Panel No results for input(s): CHOL, HDL, LDLCALC, TRIG, CHOLHDL, LDLDIRECT in the last 72 hours. Thyroid Function Tests No results for input(s): TSH, T4TOTAL, T3FREE, THYROIDAB in the last 72 hours.  Invalid input(s): FREET3  Other results:   Imaging  No results found.   Medications:     Scheduled Medications: . apixaban  5 mg Oral BID  . arformoterol  15 mcg Nebulization BID  . bisoprolol  2.5 mg Oral Daily  . budesonide (PULMICORT) nebulizer solution  0.5 mg Nebulization BID  . docusate sodium  100 mg Oral BID  . ferrous sulfate  325 mg Oral BID WC  . folic acid  1 mg Oral Daily  . gabapentin  600 mg Oral BID  . Gerhardt's butt cream   Topical Daily  . guaiFENesin  400 mg Oral BID  . hydrocortisone  25 mg Rectal BID  . insulin aspart  0-20 Units Subcutaneous TID WC  . insulin aspart  0-5 Units Subcutaneous QHS  . insulin aspart  8 Units Subcutaneous TID WC  . insulin NPH Human  25 Units  Subcutaneous BID AC & HS  . ipratropium-albuterol  3 mL Nebulization TID  . isosorbide-hydrALAZINE  1 tablet Oral TID  . lactulose  20 g Oral Once  . latanoprost  1 drop Both Eyes QHS  . liver oil-zinc oxide   Topical BID  . mouth rinse  15 mL Mouth Rinse BID  . metolazone  5 mg Oral Once  . nicotine  7 mg Transdermal Daily  . pantoprazole  40 mg Oral BID  . polyethylene glycol  17 g Oral Daily  . potassium chloride  40 mEq Oral BID  . rosuvastatin  20 mg Oral q1800  . sodium chloride flush  10-40 mL Intracatheter Q12H  . sodium chloride flush  3 mL Intravenous Q12H  . spironolactone  25 mg Oral Daily    Infusions: . sodium chloride    . furosemide (LASIX) infusion      PRN Medications: sodium chloride, acetaminophen, acetylcysteine, alum & mag hydroxide-simeth, diazepam, fluticasone, hydrocortisone cream, ondansetron (ZOFRAN) IV, oxyCODONE-acetaminophen, sodium chloride flush, sodium chloride flush   Assessment/Plan   1. Acute on chronic systolic CHF: Nonischemic cardiomyopathy.  With prominent RV failure, likely related to COPD/OSA/OHS. TEE in 3/20 with EF 40-45%, severely dilated RV with moderately decreased systolic function. He has had multiple recent admissions with CHF and COPD exacerbations.  He has not cooperated with paramedicine or [facility] home [facility] as outpatient, has not been using CPAP per his wife, suspect significant medication noncompliance as well though he denies.  Milrinone started 5/9 for RV support. Now on 0.125 with co-ox 77%.  He got Lasix 120 mg IV bid yesterday, I/Os even with CVP 18.  With RV failure, suspect we will not get CVP to normal range but still significantly volume overloaded. Creatinine down to 2.12. Difficult to explain how he is net negative almost 27,000 cc this admission with diuresis but weight is actually up slightly since he arrived.   - Stop milrinone, having frequent AF/RVR runs and co-ox is good.  I do not think that milrinone has helped  much.  - Will try again to aggressively diurese, he got Lasix 120 mg IV this morning, will start Lasix gtt at 15 mg/hr and give him metolazone 5 x 1.  - Continue UNNA boots - Continue bisoprolol 2.5 daily.  - Decrease Bidil to 0.5 tab tid with occasional soft BP.  - Continue spironolactone 25 mg daily.    - Fluid restrict.  - Cardiomems may be helpful as outpatient.  - I think his long-term prognosis, unfortunately, will be poor with significant RV dysfunction and history of poor compliance.  2. Anemia: Baseline hgb around 8.  6.7 at  admission, denies overt GI bleeding.  He has had 2 unit PRBCs and IV Fe.  Hgb 7.4 today, lower but no overt bleeding.  Colonoscopy 5/8 unremarkable but EGD showed necrotic mucosa gastric fundus/body that is concerning for gastric cancer, biopsies taken which were negative for malignancy. Hgb 8.1 today.  - Continue Eliquis - Has received IV iron per primary team and GI 3. Atrial fibrillation/flutter: Patient has history of paroxysmal atrial fibrillation.  This admission, he appears to be in an atypical atrial flutter.  Rate is currently controlled. Xarelto initially held with GI bleeding.  Was seen in past by Dr. Rayann Heman, not thought to be good ablation candidate.  He has not been on Tikosyn or amiodarone due to history of long QT. Converted back to NSR on 5/16, in NSR today but has runs of AF/RVR.  - Will eventually ask EP to reconsider ablation given lack of good anti-arrhythmic options. He seems to do better in NSR.  - Continue Eliquis.  - Stop milrinone with AF runs.  4. COPD: Severe, on 3L home oxygen.  He was wheezing initially but now much improved.  This may be due to pulmonary edema, but also consider component of COPD exacerbation.  - Per primary team  5. OHS/OSA: On home oxygen.  Per wife, has not been using CPAP at home.  Will give CPAP at night here.  6. ID: Suspect this is primarily a CHF exacerbation.  Afebrile and WBCs not significantly elevated.  1  blood culture positive for what appears to be coagulase negative Staph (probably contaminant).  COVID-19 negative.  7. CAD: Nonobstructive on prior cath.  No s/s ischemia. He is on Crestor. Off ASA with DOAC and GI bleed  8. Acute on chronic kidney failure III: Baseline seems to be 1.3-1.6.  Creatinine better today at 2.1 but still above baseline. .  9. DM2 with poor control - TRH managing  10. CVA: MRI head showed both small acute/subacute and old CVAs.  Neuro has seen, suspect related to atrial fibrillation.  - He has started on Eliquis (was on Xarelto prior).   Length of Stay: 17  Loralie Champagne, MD  03/23/2019, 9:55 AM  Advanced Heart Failure Team Pager [phone] (M-F; 7a - 4p)  Please contact [facility] for night-coverage after hours (4p -7a ) and weekends on [email or web address]

## 2019-03-23 NOTE — Progress Notes (Signed)
Physical Therapy Treatment Patient Details Name: Nicholas Caldwell MRN: [id number] DOB: [birth year] Today's Date: 03/23/2019    History of Present Illness Pt is a 73 y.o. male admitted 03/06/19 with acute on chronic heart failure and respiratory failure. Found to have severe iron deficiency anemia; now s/p small bowel enteroscopy and biopsy 5/12. Plan for DCCV 5/15. PMH includes CHF, chronic anemia, HLD, obesity, CAD, NICM, polyneuropathy, DM, COPD.    PT Comments    Pt initially declining PT session secondary to abdominal and rectal pain. However, with further questioning pt reporting that he had urinated in the chair and therefore agreeable to pivot to [facility] [place]. Pt's RN present and giving him lactalose, requesting that he sit on BSC to attempt a BM at this time. All VSS throughout with pt on 4L of O2. Pt would continue to benefit from skilled physical therapy services at this time while admitted and after d/c to address the below listed limitations in order to improve overall safety and independence with functional mobility.    Follow Up Recommendations  CIR     Equipment Recommendations  None recommended by PT    Recommendations for Other Services       Precautions / Restrictions Precautions Precautions: Fall Restrictions Weight Bearing Restrictions: No    Mobility  Bed Mobility               General bed mobility comments: pt OOB in recliner chair upon arrival  Transfers Overall transfer level: Needs assistance Equipment used: Rolling walker (2 wheeled) Transfers: Sit to/from [employer] Transfers Sit to Stand: Min assist;+2 safety/equipment Stand pivot transfers: Min assist;+2 safety/equipment       General transfer comment: increased time and effort, use of momentum, cueing for anterior weight shift; assistance needed for safety and stability with transitions  Ambulation/Gait             General Gait Details: deferred secondary to pt needing to sit on [facility]     Stairs             Wheelchair Mobility    Modified Rankin (Stroke Patients Only)       Balance Overall balance assessment: Needs assistance Sitting-balance support: No upper extremity supported;Feet supported Sitting balance-Leahy Scale: Fair     Standing balance support: Bilateral upper extremity supported Standing balance-Leahy Scale: Poor                              Cognition Arousal/Alertness: Awake/alert Behavior During Therapy: Flat affect Overall Cognitive Status: Impaired/Different from baseline Area of Impairment: Safety/judgement                         Safety/Judgement: Decreased awareness of safety;Decreased awareness of deficits            Exercises      General Comments        Pertinent Vitals/Pain Pain Assessment: Faces Faces Pain Scale: Hurts even more Pain Location: rectum, abdomen Pain Descriptors / Indicators: Discomfort;Sore Pain Intervention(s): Monitored during session;Repositioned    Home Living                      Prior Function            PT Goals (current goals can now be found in the care plan section) Acute Rehab PT Goals PT Goal Formulation: With patient Time For Goal Achievement: 03/24/19 Potential to Achieve Goals: Good  Progress towards PT goals: Progressing toward goals    Frequency    Min 3X/week      PT Plan Current plan remains appropriate    Co-evaluation              AM-PAC PT "6 Clicks" Mobility   Outcome Measure  Help needed turning from your back to your side while in a flat bed without using bedrails?: A Lot Help needed moving from lying on your back to sitting on the side of a flat bed without using bedrails?: A Lot Help needed moving to and from a bed to a chair (including a wheelchair)?: A Lot Help needed standing up from a chair using your arms (e.g., wheelchair or bedside chair)?: A Lot Help needed to walk in hospital room?: A Lot Help needed  climbing 3-5 steps with a railing? : Total 6 Click Score: 11    End of Session Equipment Utilized During Treatment: Gait belt;Oxygen Activity Tolerance: Patient limited by fatigue;Patient limited by pain Patient left: with call bell/phone within reach;Other (comment)(RN present with pt on BSC attempting to have BM) Nurse Communication: Mobility status PT Visit Diagnosis: Unsteadiness on feet (R26.81);Muscle weakness (generalized) (M62.81)     Time: 1848-5927 PT Time Calculation (min) (ACUTE ONLY): 18 min  Charges:  $Therapeutic Activity: 8-22 mins                     Sherie Don, PT, DPT  Acute Rehabilitation Services Pager [phone] Office [place] 03/23/2019, 10:55 AM

## 2019-03-23 NOTE — Progress Notes (Signed)
Inpatient Diabetes Program Recommendations  AACE/ADA: New Consensus Statement on Inpatient Glycemic Control (2015)  Target Ranges:  Prepandial:   less than 140 mg/dL      Peak postprandial:   less than 180 mg/dL (1-2 hours)      Critically ill patients:  140 - 180 mg/dL   Lab Results  Component Value Date   GLUCAP 51 (L) 03/23/2019   HGBA1C 6.8 (H) 03/22/2019    Review of Glycemic Control Results for Nicholas Caldwell, Nicholas Caldwell (MRN [id number]) as of 03/23/2019 09:51  Ref. Range 03/22/2019 16:48 03/22/2019 21:19 03/22/2019 21:48 03/23/2019 06:29  Glucose-Capillary Latest Ref Range: 70 - 99 mg/dL 105 (H) 67 (L) 97 51 (L)   Outpatient DM medications: 70/30 30 units BID, Metformin XR 1000 mg BID Current orders for Inpatient glycemic control:NPH 25 units BID, Novolog 8 units TID with meals, Novolog 0-20 units TID with meals, Novolog 0-5 units QHS;   Inpatient Diabetes Program Recommendations::   Noted patient experienced hypoglycemia this AM of 51 mg/dL.  Also of note, patient had hypo yesterday evening of 67 mg/dL. Explaining, the missed NPH dose. Since, orders have been updated.   In addition to orders changes, consider further reducing correction to Novolog 0-9 units TID and reducing NPH to 20 units BID. Also, would be hesitant to restart Metformin with current GFR of <30.  Thanks, Bronson Curb, MSN, RNC-OB Diabetes Coordinator [phone] (8a-5p)

## 2019-03-23 NOTE — Consult Note (Signed)
Physical Medicine and Rehabilitation Consult   Reason for Consult: Debility due to multiple medical issues/stroke Referring Physician: Dr. [place]   HPI: Nicholas Caldwell is a 73 y.o. male with history of PAF-on Xarelto,TBI/ seizure disorder COPD- oxygen dependent/noncompliant with inhalers, OSA- noncompliant with CPAP, non obstructive CAD, combined systolic and diastolic heart failure with multiple recent admissions; who was admitted on 03/06/2019 with 1 week history of progressive shortness of breath with hypoxia, a flutter, gross anasarca and pulmonary edema.  He was treated with IV diuresis as well as nebulizers with improvement in symptoms.  He was found to have acute on chronic anemia with hemoglobin of 6.7 and transfused with 1 units packed red blood cells and IV iron.  [name] has been following for management of heart failure and a flutter. Milrinone started on 5/10 with diuretics and close monitoring of CVP.     GI consulted due to concerns of GI bleed and need for DCCV/anticoagulation.  Patient underwent UGI/colonoscopy on 5/8 revealing erythematous/edematous necrotic appearing mucosa in gastric fundus and gastric body which was biopsied as well as gastritis and few diverticulosis.Marland Kitchen  He was placed on full liquid diet .  Biopsy positive for iron pill gastropathy. He underwent repeat UGI for surveillance on 5/12 showing friable inflamed mucosa that was biopsied and recommended PPI bid X 12 weeks minimum for healing.   GI felt that patient with improvement with PPI and recommended IV iron infusion X 3 as well as follow up for gastric mapping on outpatient basis. Biopsy negative for malignancy and he was cleared to start anticoagulation low dose IV heparin and transitioned to Eliquis by 5/14.   He had episode of unresponsiveness on 5/15 and neurology consulted for input. EEG was normal study/negative for seizure. MRI brain showed multiple acute/early subacute infarct in left posterior  frontal lobe and moderate microvascular changes.  Stroke felt to be embolic due to lack o of anticoagulation in setting of GIB and of A fib.  He developed rise in SCr therefore diuretics held briefly.  He did convert to NSR and rate currently controlled but question need for EP involvement for ablation. He continues to be markedly overloaded and question compliance with FR and cards feels that long term prognosis is poor.  He developed penile wound due to constriction from condom catheter and being treated locally. Therapy evaluations completed yesterday and CIR recommended due to SOB with limited activity tolerance and decline in functional status.   Discussed with nursing, patient has been noncompliant with diet, eating Jolly rancher's  Review of Systems  Constitutional: Negative for chills and fever.  HENT: Negative for hearing loss and tinnitus.   Eyes: Negative for blurred vision and double vision.  Respiratory: Positive for shortness of breath.   Cardiovascular: Negative for chest pain and palpitations.  Gastrointestinal: Negative for abdominal pain, constipation, heartburn, nausea and vomiting.  Musculoskeletal: Positive for myalgias.  Skin: Negative for rash.  Neurological: Positive for weakness (Reports that this is not uncommon. ). Negative for dizziness, sensory change and headaches.  Psychiatric/Behavioral: Negative for depression. The patient is not nervous/anxious.       Past Medical History:  Diagnosis Date   Atrial flutter ([place])    a. recurrent AFlutter with RVR;  b. Amiodarone Rx started 4/16   CAD (coronary artery disease)    a. LHC 1/16:  mLAD diffuse disease, pLCx mild disease, dLCx with disease but too small for PCI, RCA ok, EF 25-30%   Chronic pain  Chronic systolic CHF (congestive heart failure) (HCC)    COPD (chronic obstructive pulmonary disease) (HCC)    Diabetes mellitus without complication ([place])    Hypercholesteremia    Hypertension    NICM  (nonischemic cardiomyopathy) ([place])    a.dx 2016. b. 2D echo 06/2016 - Last echo 07/01/16: mod dilated LV, mod LVH, EF 25-30%, mild-mod MR, sev LAE, mild-mod reduced RV systolic function, mild-mod TR, PASP 44mHG.   PAF (paroxysmal atrial fibrillation) (HCC)    On amio - ot a candidate for flecainide due to cardiomyopathy, not a candidate for Tikosyn due to prolonged QT, and felt to be a poor candidate for ablation given left atrial size.   Pulmonary hypertension ([place]    Tobacco abuse     Past Surgical History:  Procedure Laterality Date   BIOPSY  03/11/2019   Procedure: BIOPSY;  Surgeon: CLavena Bullion DO;  [facility]  Service: Gastroenterology;;   BIOPSY  03/15/2019   Procedure: BIOPSY;  Surgeon: MIrving Copas, MD;  [facility]  Service: Gastroenterology;;   CARDIOVERSION N/A 07/30/2018   Procedure: CARDIOVERSION;  Surgeon: MLarey Dresser MD;  [facility];  Service: Cardiovascular;  Laterality: N/A;   COLONOSCOPY N/A 03/11/2019   Procedure: COLONOSCOPY;  Surgeon: CLavena Bullion DO;  [place] [facility]  Service: Gastroenterology;  Laterality: N/A;   ENTEROSCOPY N/A 03/15/2019   Procedure: ENTEROSCOPY;  Surgeon: MRush LandmarkGTelford Nab, MD;  [facility]  Service: Gastroenterology;  Laterality: N/A;   ESOPHAGOGASTRODUODENOSCOPY Left 03/11/2019   Procedure: ESOPHAGOGASTRODUODENOSCOPY (EGD);  Surgeon: CLavena Bullion DO;  [facility];  Service: Gastroenterology;  Laterality: Left;   LEFT AND RIGHT HEART CATHETERIZATION WITH CORONARY ANGIOGRAM N/A 11/06/2014   Procedure: LEFT AND RIGHT HEART CATHETERIZATION WITH CORONARY ANGIOGRAM;  Surgeon: JJettie Booze MD;  [facility];  Service: Cardiovascular;  Laterality: N/A;   RIGHT/LEFT HEART CATH AND CORONARY ANGIOGRAPHY N/A 12/06/2018   Procedure: RIGHT/LEFT HEART CATH AND CORONARY ANGIOGRAPHY;  Surgeon: MLarey Dresser MD;  [facility];  Service: Cardiovascular;  Laterality: N/A;   SPLENECTOMY     SUBMUCOSAL TATTOO INJECTION  03/15/2019   Procedure: SUBMUCOSAL TATTOO INJECTION;  Surgeon: MRush LandmarkGTelford Nab, MD;  [facility];  Service: Gastroenterology;;    Family History  Problem Relation Age of Onset   Heart disease Mother    Hypertension Mother    Heart failure Mother    Heart disease Father    Hypertension Sister    Heart attack Neg Hx    Stroke Neg Hx     Social History:  Married. Independent with walker PTA. He reports that he quit smoking about 3 months ago. His smoking use included cigarettes. He has a 34.00 pack-year smoking history. He has never used smokeless tobacco. He reports that he does not drink alcohol or use drugs.    Allergies: No Known Allergies    Medications Prior to Admission  Medication Sig Dispense Refill   albuterol (VENTOLIN HFA) 108 (90 Base) MCG/ACT inhaler Inhale 2 puffs into the lungs every 6 (six) hours as needed for wheezing or shortness of breath.     bisoprolol (ZEBETA) 5 MG tablet Take 0.5 tablets (2.5 mg total) by mouth daily for 30 days. 15 tablet 3   budesonide-formoterol (SYMBICORT) 160-4.5 MCG/ACT inhaler Inhale 2 puffs into the lungs 2 (two) times daily.     diazepam (VALIUM) 5 MG tablet Take 5 mg by mouth daily as needed for anxiety.  fluticasone (FLONASE) 50 MCG/ACT nasal spray Place 2 sprays into both nostrils daily as needed for allergies.      gabapentin (NEURONTIN) 600 MG tablet Take 1 tablet (600 mg total) by mouth 2 (two) times daily. 60 tablet 0   guaifenesin (HUMIBID E) 400 MG TABS tablet Take 1 tablet (400 mg total) by mouth every 6 (six) hours as needed. (Patient taking differently: Take 400 mg by mouth every 6 (six) hours as needed (cough). ) 10 tablet 0   hydrocortisone (ANUSOL-HC) 2.5 % rectal cream Place 1 application rectally 2 (two) times daily as needed for hemorrhoids or itching.      hydrocortisone 1 % lotion  Apply 1 application topically See admin instructions. Apply small amount to back twice daily for itchy rash     insulin aspart protamine - aspart (NOVOLOG MIX 70/30 FLEXPEN) (70-30) 100 UNIT/ML FlexPen Inject 0.05 mLs (5 Units total) into the skin 2 (two) times daily. (Patient taking differently: Inject 30 Units into the skin 2 (two) times daily. ) 15 mL 11   ipratropium-albuterol (DUONEB) 0.5-2.5 (3) MG/3ML SOLN Take 3 mLs by nebulization 2 (two) times daily as needed (shortness of breath/wheezing).      isosorbide-hydrALAZINE (BIDIL) 20-37.5 MG tablet Take 1 tablet by mouth 3 (three) times daily. (Patient taking differently: Take 1 tablet by mouth 2 (two) times a day. ) 90 tablet 3   latanoprost (XALATAN) 0.005 % ophthalmic solution Place 1 drop into both eyes at bedtime.     magnesium oxide (MAG-OX) 400 MG tablet Take 400 mg by mouth daily.     metFORMIN (GLUCOPHAGE-XR) 500 MG 24 hr tablet Take 1,000 mg by mouth 2 (two) times daily with a meal.     metolazone (ZAROXOLYN) 2.5 MG tablet Take 1 tablet (2.5 mg total) by mouth every Wednesday. Take extra Potassium 96mq (40 total, daily) when taking this medication 10 tablet 3   oxyCODONE-acetaminophen (PERCOCET) 10-325 MG tablet Take 1 tablet by mouth every 6 (six) hours.     OXYGEN Inhale 3 L into the lungs continuous.      pantoprazole (PROTONIX) 40 MG tablet Take 1 tablet (40 mg total) by mouth 2 (two) times daily. 60 tablet 0   polyethylene glycol (MIRALAX / GLYCOLAX) packet Take 17 g by mouth daily. (Patient taking differently: Take 17 g by mouth daily as needed (constipation). ) 14 each 0   potassium chloride SA (K-DUR) 20 MEQ tablet Take 40 mEq by mouth See admin instructions. Take 2 tablets (40 meq) by mouth every morning, take an additional tablet (20 meq) at night if taking metolazone that day.     rivaroxaban (XARELTO) 20 MG TABS tablet Take 1 tablet (20 mg total) by mouth daily with supper. (Patient taking differently: Take 20 mg  by mouth at bedtime. ) 30 tablet 4   rosuvastatin (CRESTOR) 40 MG tablet Take 20 mg by mouth daily.     sodium chloride (OCEAN) 0.65 % SOLN nasal spray Place 2 sprays into both nostrils as needed for congestion.      torsemide (DEMADEX) 20 MG tablet Take 4 tablets (80 mg total) by mouth 2 (two) times daily for 30 days. 240 tablet 0   acetaminophen (TYLENOL) 160 MG/5ML solution Take 20.3 mLs (650 mg total) by mouth every 6 (six) hours as needed for mild pain, headache or fever. (Patient not taking: Reported on 03/06/2019) 120 mL 0   diclofenac sodium (VOLTAREN) 1 % GEL Apply 2 g topically 4 (four) times daily. (Patient  not taking: Reported on 03/07/2019) 1 Tube 1   Magnesium Oxide 420 (252 Mg) MG TABS Take 420 mg by mouth 2 (two) times daily. (Patient not taking: Reported on 03/06/2019) 60 tablet 0   Multiple Vitamin (MULTIVITAMIN WITH MINERALS) TABS tablet Take 1 tablet by mouth daily. (Patient not taking: Reported on 03/06/2019)     potassium chloride SA (K-DUR,KLOR-CON) 20 MEQ tablet Take 2 tablets (40 mEq total) by mouth every morning AND 1 tablet (20 mEq total) at bedtime. (Patient not taking: Reported on 03/06/2019) 90 tablet 0   rosuvastatin (CRESTOR) 20 MG tablet Take 1 tablet (20 mg total) by mouth daily. (Patient not taking: Reported on 03/06/2019) 30 tablet 0    Home: [place] expects to be discharged to:: Private residence Living Arrangements: Spouse/significant other Available Help at Discharge: Family, Available 24 hours/day Type of Home: House Home Access: Stairs to enter [employer] of Steps: 1 Entrance Stairs-Rails: None Home Layout: One level Bathroom Shower/Tub: Multimedia programmer: Standard Bathroom Accessibility: Yes Home Equipment: Environmental consultant - 2 wheels, Cane - single point, Bedside commode, Shower seat Additional Comments: on 3L of O2 at all times at home  Lives With: Spouse  Functional History: Prior Function Level of Independence:  Needs assistance Gait / Transfers Assistance Needed: uses RW for mobility ADL's / Homemaking Assistance Needed: assist from wife for LB ADL and wife does IADL Comments: denies falls between [place].  Tells PT that [facility] or sc[facility] Functional Status:  Mobility: Bed Mobility Overal bed mobility: Needs Assistance Bed Mobility: Sit to Supine Supine to sit: Mod assist, HOB elevated Sit to supine: Mod assist(A for legs) General bed mobility comments: HOB elevated, max verbal and tactile cues to move LEs to EOB, modA for trunk elevation and to scoot to EOB, required increased time Transfers Overall transfer level: Needs assistance Equipment used: Rolling walker (2 wheeled) Transfers: Sit to/from Stand Sit to Stand: Mod assist Stand pivot transfers: Mod assist General transfer comment: bed elevated and with verbal cues pt able to push up from bed, pt requiring modAx2 to push up from chair Ambulation/Gait Ambulation/Gait assistance: Min assist, +2 physical assistance Gait Distance (Feet): 15 Feet Assistive device: Rolling walker (2 wheeled) Gait Pattern/deviations: Step-to pattern, Decreased stride length, Trunk flexed General Gait Details: max encouragement required for pt to even attempt ambulation, pt very SOB, SpO2 in low 80s on 4LO2 via [place] Gait velocity: slow Gait velocity interpretation: <1.8 ft/sec, indicate of risk for recurrent falls    ADL: ADL Overall ADL's : Needs assistance/impaired Eating/Feeding: Set up Eating/Feeding Details (indicate cue type and reason): supported sitting Grooming: Set up, Supervision/safety Grooming Details (indicate cue type and reason): supported sitting Upper Body Bathing: Moderate assistance Upper Body Bathing Details (indicate cue type and reason): supported sitting Lower Body Bathing: Total assistance Lower Body Bathing Details (indicate cue type and reason): Mod A sit<>stand from  recliner Upper Body Dressing : Moderate assistance Upper Body Dressing Details (indicate cue type and reason): supported sitting Lower Body Dressing: Total assistance Lower Body Dressing Details (indicate cue type and reason): Mod A sit<>stand Toilet Transfer: Moderate assistance, Buyer, retail Details (indicate cue type and reason): recliner to bed Toileting- Clothing Manipulation and Hygiene: Total assistance Toileting - Clothing Manipulation Details (indicate cue type and reason): Mod A sit<>stand  Cognition: Cognition Overall Cognitive Status: Impaired/Different from baseline Orientation Level: Oriented X4 Attention: Selective Selective Attention: Impaired Selective Attention Impairment: Verbal basic, Verbal complex,  Functional basic Memory: Impaired Memory Impairment: Storage deficit, Retrieval deficit Awareness: Impaired Awareness Impairment: Emergent impairment Problem Solving: Impaired Problem Solving Impairment: Verbal basic, Verbal complex Safety/Judgment: Impaired Cognition Arousal/Alertness: Awake/alert Behavior During Therapy: Flat affect Overall Cognitive Status: Impaired/Different from baseline Area of Impairment: Safety/judgement Safety/Judgement: Decreased awareness of safety(would not turn completely around before he sat on EOB) Problem Solving: Slow processing, Difficulty sequencing, Decreased initiation, Requires verbal cues, Requires tactile cues General Comments: pt significantly more lethargic/sluggish than previous session, pt requiiring max verbal and tactile cues to sequence transfers, pt very delayed processing  Blood pressure (!) 109/58, pulse 78, temperature 98.5 F (36.9 C), temperature source Oral, resp. rate 17, height 6' (1.829 m), weight 125.8 kg, SpO2 97 %. Physical Exam  Nursing note and vitals reviewed. Constitutional: He is oriented to person, place, and time. He appears well-developed and well-nourished. No distress.  Up in  chair. NAD.   Respiratory: Effort normal. He has rhonchi.  GI: He exhibits distension. Bowel sounds are increased. There is no abdominal tenderness.  Tight and protuberant.   Musculoskeletal:     Comments: Unna boots BLE.   Neurological: He is alert and oriented to person, place, and time.  Speech clear and able to follow simple motor commands without difficulty. Has poor insight/awareness of deficits.   Skin: He is not diaphoretic.  Motor strength is 4/5 bilateral deltoid bicep tricep grip 3- bilateral hip flexor knee extensor 4- ankle dorsiflexor Sensation difficult to assess in the lower extremity secondary to Unna boots, intact in the upper extremities  Results for orders placed or performed during the hospital encounter of 03/06/19 (from the past 24 hour(s))  Glucose, capillary     Status: Abnormal   Collection Time: 03/22/19  9:29 AM  Result Value Ref Range   Glucose-Capillary 116 (H) 70 - 99 mg/dL   Comment 1 Notify RN    Comment 2 Document in Chart   Glucose, capillary     Status: Abnormal   Collection Time: 03/22/19 11:40 AM  Result Value Ref Range   Glucose-Capillary 207 (H) 70 - 99 mg/dL   Comment 1 Notify RN    Comment 2 Document in Chart   Glucose, capillary     Status: Abnormal   Collection Time: 03/22/19  4:48 PM  Result Value Ref Range   Glucose-Capillary 105 (H) 70 - 99 mg/dL   Comment 1 Notify RN    Comment 2 Document in Chart   Glucose, capillary     Status: Abnormal   Collection Time: 03/22/19  9:19 PM  Result Value Ref Range   Glucose-Capillary 67 (L) 70 - 99 mg/dL  Glucose, capillary     Status: None   Collection Time: 03/22/19  9:48 PM  Result Value Ref Range   Glucose-Capillary 97 70 - 99 mg/dL  .Cooxemetry Panel (carboxy, met, total hgb, O2 sat)     Status: Abnormal   Collection Time: 03/23/19  4:45 AM  Result Value Ref Range   Total hemoglobin 7.8 (L) 12.0 - 16.0 g/dL   O2 Saturation 76.7 %   Carboxyhemoglobin 2.2 (H) 0.5 - 1.5 %   Methemoglobin  1.8 (H) 0.0 - 1.5 %  CBC     Status: Abnormal   Collection Time: 03/23/19  4:54 AM  Result Value Ref Range   WBC 9.5 4.0 - 10.5 K/uL   RBC 3.13 (L) 4.22 - 5.81 MIL/uL   Hemoglobin 8.1 (L) 13.0 - 17.0 g/dL   HCT 27.7 (L) 39.0 - 52.0 %  MCV 88.5 80.0 - 100.0 fL   MCH 25.9 (L) 26.0 - 34.0 pg   MCHC 29.2 (L) 30.0 - 36.0 g/dL   RDW 25.1 (H) 11.5 - 15.5 %   Platelets 364 150 - 400 K/uL   nRBC 0.0 0.0 - 0.2 %  Basic metabolic panel     Status: Abnormal   Collection Time: 03/23/19  4:54 AM  Result Value Ref Range   Sodium 139 135 - 145 mmol/L   Potassium 4.3 3.5 - 5.1 mmol/L   Chloride 92 (L) 98 - 111 mmol/L   CO2 35 (H) 22 - 32 mmol/L   Glucose, Bld 54 (L) 70 - 99 mg/dL   BUN 88 (H) 8 - 23 mg/dL   Creatinine, Ser 2.12 (H) 0.61 - 1.24 mg/dL   Calcium 8.6 (L) 8.9 - 10.3 mg/dL   GFR calc non Af Amer 31 (L) >60 mL/min   GFR calc Af Amer 35 (L) >60 mL/min   Anion gap 12 5 - 15  Magnesium     Status: None   Collection Time: 03/23/19  4:54 AM  Result Value Ref Range   Magnesium 2.4 1.7 - 2.4 mg/dL  Glucose, capillary     Status: Abnormal   Collection Time: 03/23/19  6:29 AM  Result Value Ref Range   Glucose-Capillary 51 (L) 70 - 99 mg/dL   Comment 1 Notify RN    Comment 2 Document in Chart    Vas US Carotid (at [place] Only)  Result Date: 03/22/2019 Carotid Arterial Duplex Study Indications:       CVA. Risk Factors:      Hypertension, hyperlipidemia, Diabetes. Other Factors:     Atrial fibrillation, COPD, OSA, CHF, NICM. Limitations:       Body habitus, Atrial fibrillation Comparison Study:  No prior study on file for comparison Performing Technologist: Sharion Dove RVS  Examination Guidelines: A complete evaluation includes B-mode imaging, spectral Doppler, color Doppler, and power Doppler as needed of all accessible portions of each vessel. Bilateral testing is considered an integral part of a complete examination. Limited examinations for reoccurring indications may be performed as  noted.  Right Carotid Findings: +----------+--------+--------+--------+--------+------------------+             PSV cm/s EDV cm/s Stenosis Describe Comments            +----------+--------+--------+--------+--------+------------------+  CCA Prox   89       33                         intimal thickening  +----------+--------+--------+--------+--------+------------------+  CCA Distal 72       20                         intimal thickening  +----------+--------+--------+--------+--------+------------------+  ICA Prox   63       22                                             +----------+--------+--------+--------+--------+------------------+  ICA Distal 94       29                                             +----------+--------+--------+--------+--------+------------------+  ECA        76       12                                             +----------+--------+--------+--------+--------+------------------+ +----------+--------+-------+--------+-------------------+             PSV cm/s EDV cms Describe Arm Pressure (mmHG)  +----------+--------+-------+--------+-------------------+  Subclavian 112                                            +----------+--------+-------+--------+-------------------+ +---------+--------+--+--------+--+  Vertebral PSV cm/s 62 EDV cm/s 23  +---------+--------+--+--------+--+  Left Carotid Findings: +----------+--------+--------+--------+--------+------------------+             PSV cm/s EDV cm/s Stenosis Describe Comments            +----------+--------+--------+--------+--------+------------------+  CCA Prox   87       31                         intimal thickening  +----------+--------+--------+--------+--------+------------------+  CCA Distal 87       34                         intimal thickening  +----------+--------+--------+--------+--------+------------------+  ICA Prox   84       29                                              +----------+--------+--------+--------+--------+------------------+  ICA Distal 75       30                                             +----------+--------+--------+--------+--------+------------------+  ECA        99       13                                             +----------+--------+--------+--------+--------+------------------+ +----------+--------+--------+--------+-------------------+  Subclavian PSV cm/s EDV cm/s Describe Arm Pressure (mmHG)  +----------+--------+--------+--------+-------------------+             96                                              +----------+--------+--------+--------+-------------------+ +---------+--------+--+--------+--+  Vertebral PSV cm/s 44 EDV cm/s 19  +---------+--------+--+--------+--+  Summary: Right Carotid: The extracranial vessels were near-normal with only minimal wall                thickening or plaque. Left Carotid: The extracranial vessels were near-normal with only minimal wall               thickening or plaque. Vertebrals:  Bilateral vertebral arteries demonstrate antegrade flow. Subclavians: Normal flow hemodynamics were seen in bilateral subclavian  arteries. *See table(s) above for measurements and observations.  Electronically signed by Antony Contras MD on 03/22/2019 at 8:41:35 AM.    Final      Assessment/Plan: Diagnosis: Debility secondary to exacerbation of CHF 1. Does the need for close, 24 hr/day medical supervision in concert with the patient's rehab needs make it unreasonable for this patient to be served in a less intensive setting? Yes 2. Co-Morbidities requiring supervision/potential complications: Accessible atrial fibrillation history of remote traumatic brain injury with seizure disorder, history of COPD with chronic O2 dependence, OSA noncompliant 3. Due to bladder management, bowel management, safety, skin/wound care, disease management, medication administration, pain management and patient education, does the  patient require 24 hr/day rehab nursing? Yes 4. Does the patient require coordinated care of a physician, rehab nurse, PT (1-2 hrs/day, 5 days/week) and OT (1-2 hrs/day, 5 days/week) to address physical and functional deficits in the context of the above medical diagnosis(es)? Yes Addressing deficits in the following areas: balance, endurance, locomotion, strength, transferring, bowel/bladder control, bathing, dressing, feeding, grooming, toileting and psychosocial support 5. Can the patient actively participate in an intensive therapy program of at least 3 hrs of therapy per day at least 5 days per week? Potentially 6. The potential for patient to make measurable gains while on inpatient rehab is good 7. Anticipated functional outcomes upon discharge from inpatient rehab are supervision  with PT, supervision with OT, n/a with SLP. 8. Estimated rehab length of stay to reach the above functional goals is: 14-18d 9. Anticipated D/C setting: Home 10. Anticipated post D/C treatments: [place] therapy 11. Overall Rehab/Functional Prognosis: good  RECOMMENDATIONS: This patient's condition is appropriate for continued rehabilitative care in the following setting: CIR although patient refusing Patient has agreed to participate in recommended program. No Note that insurance prior authorization may be required for reimbursement for recommended care.  Comment: Patient's ICU nurse feels the patient would not comply with rehab "I have personally performed a face to face diagnostic evaluation of this patient.  Additionally, I have reviewed and concur with the physician assistant's documentation above." Charlett Blake M.D. [facility] FAAPM&R (Sports Med, Neuromuscular Med) Diplomate Am Board of [place], PA-C 03/23/2019

## 2019-03-23 NOTE — Progress Notes (Signed)
  Speech Language Pathology Treatment: Cognitive-Linquistic  Patient Details Name: Nicholas Caldwell MRN: [id number] DOB: [birth year] Today's Date: 03/23/2019 Time: 3818-2993 SLP Time Calculation (min) (ACUTE ONLY): 15 min  Assessment / Plan / Recommendation Clinical Impression  Pt was seen for cognitive-linguistic treatment session. He was alert throughout the session but required encouragement to participate. Pt's awareness of his deficits continues to be reduced, and he frequently expressed that he will not have any difficulty performing any tasks when he leaves but if he does he will just ask his wife to do everything. He achieved 67% accuracy with a medication management (prescription) task increasing to 100% accuracy with min-mod cues. He demonstrated 80% accuracy with problem solving related to safety increasing to 100% accuracy with min. cues. He required mod support for reasoning but was able to attend to tasks without cues or re-direction. SLP will continue to follow pt but his continued participation is questioned.    HPI HPI: Pt is a 73 y.o. male admitted 03/06/19 with acute on chronic heart failure and respiratory failure. Found to have severe iron deficiency anemia; now s/p small bowel enteroscopy and biopsy 5/12. Plan for DCCV 5/15. PMH includes CHF, chronic anemia, HLD, obesity, CAD, NICM, polyneuropathy, DM, COPD. MRI on 5/17 revealed small group of subcentimeter acute/early subacute infarctions of left posterior frontal lobe subcortical white matter; very small chronic infarctions within bilateral cerebellar hemispheres.       SLP Plan  Continue with current plan of care       Recommendations                   Plan: Continue with current plan of care       Antion Andres I. Hardin Negus, [place], [place] Office number [phone] Pager [place] 03/23/2019, 2:32 PM

## 2019-03-23 NOTE — Progress Notes (Signed)
Inpatient Rehab Admissions:  Inpatient Rehab Consult received.  I met with patient at the bedside for rehabilitation assessment and to discuss goals and expectations of an inpatient rehab admission.  He states he would really rather go home, but would consider rehab if he really needs it.  Note he continues to be in volume overload.  Will f/u with patient tomorrow so he has time to speak with his wife.    Signed: Shann Medal, PT, DPT Admissions Coordinator [phone] 03/23/19  2:33 PM

## 2019-03-23 NOTE — Progress Notes (Signed)
PROGRESS NOTE    Nicholas Caldwell  KNL:[id number] DOB: 12-Dec-1945 DOA: 03/06/2019 PCP: [facility]    Brief Narrative:  73 y.o.Mwith CAD, isch CM EF 40%, sdCHF, pAF on Xarelto, pHTN, MO, OSA not compliant with CPAP, COPD on home O2 3L, and smoking who presented with swelling and dyspnea.  Patient had had slow progressive dyspnea, SOB with exertion, also significant peripheral edema. In ER, BNP >1000, CXR with edema, HR 124 in Afib, and new anemia Hgb 6.7 g/dL. Transfused and started on Lasix.  Assessment & Plan:   Principal Problem:   Acute on chronic respiratory failure with hypoxia and hypercapnia (HCC) Active Problems:   Essential hypertension   Type 2 diabetes mellitus with neuropathy   PAF (paroxysmal atrial fibrillation) (HCC)   COPD (chronic obstructive pulmonary disease) (HCC)   Medically noncompliant   Atrial fibrillation with RVR (HCC)   AKI (acute kidney injury) ([place])   Acute on chronic anemia   Acute on chronic systolic (congestive) heart failure (HCC)   Acute on chronic systolic CHF (congestive heart failure) (HCC)   Iron deficiency anemia   NSVT (nonsustained ventricular tachycardia) (HCC)   Tobacco abuse counseling   Gastritis and gastroduodenitis   Cerebral embolism with cerebral infarction   Acute versus subacute ischemic stroke, likely cardioembolic Overnight, patient was apparently confused and nonverbal for a few seconds.  Patient states he was angry at nurses and was just not communicating.  Rapid response was initiated with a CT head notable for small bilateral cerebellar infarcts, question chronic versus acute.  EEG with no seizure activity.  MR brain notable for small group of left frontal acute versus early subacute strokes in the setting of a flutter/atrial fibrillation. --Neurology had been following --Lipid panel 5/2 w/ TC 83, LDL 18, HDL 43, TG 109 --HbA1c 6.07 Nov 2018 --Bilateral carotid Dopplers with no significant findings --continue  crestor as tolerated --Continue anticoagulation with Eliquis --continue to monitor on telemetry --Continue home gabapentin  Acute on chronic hypoxic respiratory failure: resolved Patient presenting from home with progressive dyspnea associated with worsening lower extremity edema.  Likely multifactorial from acute CHF exacerbation and COPD flare, as well as anemia, NSVT/AF.  Chest x-ray on admission notable for bilateral interstitial and alveolar airspace opacities with cardiomegaly. --Cont to wean O2 as tolerated --Continue supportive care  Acute on chronic systolic and diastolic CHF EF 79-02% on TEE in Mar.  --Cardiology/HF team following, appreciate assistance --Net negative 1.7L yesterday; net negative 27.5L since admission --Transitioned metolazone, Acetazolamide and Lasix gtt to torsemide 40m PO BID on 5/18 by cards due to increase in creatinine --Now back on lasix gtt per Cardiology --ContinueCrestor --ContinueBB --Continue on Cardiac, low-salt diet, fluid restrict to 1500 mL's per day  Atrial fibrillation/atypical aflutter --Continues with persistent A. fib/flutter on telemetry.  Cardiology following, appreciate assistance.  Has been discussed with EP, not a optimal ablation candidate, may consider in the future. --Initially started on heparin drip on 03/15/2019; transitioned to Eliquis 5/14 --Hgb stable, 8.2 today --continue beta-blocker --continues in NSR since 5/16 --Continue to monitor on telemetry  Acute kidney injury Creatinine baseline 1.2 - 1.6 for the last 6 months roughly, trending up from 0.9 last few years. AKI again this hospitalization, has been increasing last few days with diuresis. -Cr now trending down, labs reviewed --Continue daily BMP  Iron deficiency anemia Acute blood loss anemia Upper GI bleed Suspected chronic GI blood lossfrom gastric ulcer.  Received 2 units PRBCs during the hospitalization. EGD 5/12 with multiple nonbleeding  erosions,  one nonbleeding superficial gastric ulcer with clean ulcer base --Pathology from stomach biopsy negative for metaplasia, dysplasia or malignancy, no H. pylori --s/p Feraheme 5/4, 5/12 --Continue protonix 24m BID x 12 weeks --Minimize NSAID use --Hgb stable 8.6-->8.4-->8.2-->8.4, no signs of bleeding --Continue to monitor daily CBC, especially now on Eliquis --GI recommends outpatient follow-up and consideration of gastric mapping in the future as an outpatient; will need follow-up with Dr. CBryan Lemma --seems stable at present  Acute COPD exacerbation Patient with home O2 dependence at 3 L per nasal cannula. --completed course of steroids and doxycycline --Pulmicort/Brovana --Continue nebs prn --Chest PT, Mucinex, Mucomyst prn --Continue to titrate supplemental oxygen to maintain SPO2 greater than 88% --Stable at present  Positive blood culture --CoNS in 1/2. Likely contaminant.  Hypokalemia -K 3.8 today,  --Continue potassium 40 mEq twice daily  --repeat bmet in AM  Diabetes Hemoglobin A1c 6.5 on 11/13/2018.  Home regimen includes metformin 1000 mg p.o. twice daily, NPH 70-30 5u BID.  Glucose poorly controlled during hospitalization, likely exacerbated by need of IV steroids for COPD exacerbation as above.  Also with likely dietary noncompliance. --Continues to be hypoglycemic --NPH further decreased from to 20u SQ BID --Novolog decreased to 8u TIDAC --Cont on SSI coverage --CBG's QACHS, continue to monitor and adjust insulin regimen as needed  Hypertension Coronary disease secondary prevention -ContinueCrestor, BB  Paroxysmal atrial fibrillation NSVT CHA2DS2-Vasc =4 (Age, CHF, PVD, DM). -continue anticoagulation with Eliquis -ContinueBB as tolerated  OHS/OSA/MO --Noncompliant with CPAP at home per report, but has been compliant while inpatient --Continue to encourage compliance with CPAP nocturnally  Chronic pain -Chief complaint seems to be primarily  pain medications -cont analgesics only as needed  Constipation -Patient reports small BM -abd distended with decreased BS -Will give trial of lactulose   DVT prophylaxis: SCD's Code Status: Full Family Communication: Pt in room, family not at bedside Disposition Plan: Uncertain at this time, CIR eval pending  Consultants:   Cardiology  Procedures:     Antimicrobials: Anti-infectives (From admission, onward)   Start     Dose/Rate Route Frequency Ordered Stop   03/11/19 1000  doxycycline (VIBRA-TABS) tablet 100 mg  Status:  Discontinued     100 mg Oral Every 12 hours 03/11/19 0733 03/18/19 0646   03/06/19 1945  ceFEPIme (MAXIPIME) 2 g in sodium chloride 0.9 % 100 mL IVPB     2 g 200 mL/hr over 30 Minutes Intravenous  Once 03/06/19 1941 03/06/19 2046   03/06/19 1945  vancomycin (VANCOCIN) 2,000 mg in sodium chloride 0.9 % 500 mL IVPB     2,000 mg 250 mL/hr over 120 Minutes Intravenous  Once 03/06/19 1941 03/07/19 0015       Subjective: Complaining about wanting more pain medications  Objective: Vitals:   03/23/19 0347 03/23/19 0500 03/23/19 0743 03/23/19 1133  BP: (!) 103/59  (!) 109/58 (!) 112/56  Pulse:   78 72  Resp: 17     Temp: 99.1 F (37.3 C)  98.5 F (36.9 C) 98.2 F (36.8 C)  TempSrc: Axillary  Oral Oral  SpO2:   97% 92%  Weight:  125.8 kg    Height:        Intake/Output Summary (Last 24 hours) at 03/23/2019 1156 Last data filed at 03/23/2019 0743 Gross per 24 hour  Intake 1040.9 ml  Output 1100 ml  Net -59.1 ml   Filed Weights   03/20/19 0300 03/22/19 0500 03/23/19 0500  Weight: 125.1 kg 123.2 kg 125.8 kg  Examination:  General exam: Appears calm and comfortable  Respiratory system: Clear to auscultation. Respiratory effort normal. Cardiovascular system: S1 & S2 heard, RRR Gastrointestinal system: Abdomen is distended, decreased BS Central nervous system: Alert and oriented. No focal neurological deficits. Extremities: Symmetric 5 x 5  power. Skin: No rashes, lesions Psychiatry: Judgement and insight appear normal. Mood & affect appropriate.   Data Reviewed: I have personally reviewed following labs and imaging studies  CBC: Recent Labs  Lab 03/20/19 0500 03/20/19 1037 03/21/19 0500 03/22/19 0418 03/23/19 0454  WBC 9.4 11.5* 10.9* 10.8* 9.5  HGB 7.4* 8.5* 8.2* 8.4* 8.1*  HCT 26.2* 29.3* 28.9* 29.7* 27.7*  MCV 90.0 89.1 89.5 89.2 88.5  PLT 289 344 334 372 960   Basic Metabolic Panel: Recent Labs  Lab 03/19/19 0500 03/20/19 0500 03/20/19 0739 03/20/19 1037 03/21/19 0500 03/22/19 0418 03/23/19 0454  NA 135 124*  --  135 137 139 139  K 3.8 4.0  --  4.4 4.8 3.8 4.3  CL 85* 80*  --  89* 89* 91* 92*  CO2 35* 32  --  34* 36* 37* 35*  GLUCOSE 343* 571* 113* 233* 158* 49* 54*  BUN 72* 69*  --  80* 92* 93* 88*  CREATININE 1.94* 1.88*  --  2.11* 2.61* 2.39* 2.12*  CALCIUM 8.7* 7.7*  --  8.5* 8.6* 8.7* 8.6*  MG 2.1 1.9  --   --  2.4 2.4 2.4   GFR: Estimated Creatinine Clearance: 44.4 mL/min (A) (by C-G formula based on SCr of 2.12 mg/dL (H)). Liver Function Tests: No results for input(s): AST, ALT, ALKPHOS, BILITOT, PROT, ALBUMIN in the last 168 hours. No results for input(s): LIPASE, AMYLASE in the last 168 hours. No results for input(s): AMMONIA in the last 168 hours. Coagulation Profile: No results for input(s): INR, PROTIME in the last 168 hours. Cardiac Enzymes: No results for input(s): CKTOTAL, CKMB, CKMBINDEX, TROPONINI in the last 168 hours. BNP (last 3 results) No results for input(s): PROBNP in the last 8760 hours. HbA1C: Recent Labs    03/22/19 0419  HGBA1C 6.8*   CBG: Recent Labs  Lab 03/22/19 1140 03/22/19 1648 03/22/19 2119 03/22/19 2148 03/23/19 0629  GLUCAP 207* 105* 67* 97 51*   Lipid Profile: No results for input(s): CHOL, HDL, LDLCALC, TRIG, CHOLHDL, LDLDIRECT in the last 72 hours. Thyroid Function Tests: No results for input(s): TSH, T4TOTAL, FREET4, T3FREE, THYROIDAB in  the last 72 hours. Anemia Panel: No results for input(s): VITAMINB12, FOLATE, FERRITIN, TIBC, IRON, RETICCTPCT in the last 72 hours. Sepsis Labs: No results for input(s): PROCALCITON, LATICACIDVEN in the last 168 hours.  No results found for this or any previous visit (from the past 240 hour(s)).   Radiology Studies: No results found.  Scheduled Meds:  apixaban  5 mg Oral BID   arformoterol  15 mcg Nebulization BID   bisoprolol  2.5 mg Oral Daily   budesonide (PULMICORT) nebulizer solution  0.5 mg Nebulization BID   docusate sodium  100 mg Oral BID   ferrous sulfate  325 mg Oral BID WC   folic acid  1 mg Oral Daily   gabapentin  600 mg Oral BID   Gerhardt's butt cream   Topical Daily   guaiFENesin  400 mg Oral BID   hydrocortisone  25 mg Rectal BID   insulin aspart  0-15 Units Subcutaneous TID WC   insulin aspart  0-5 Units Subcutaneous QHS   insulin aspart  8 Units Subcutaneous TID WC  insulin NPH Human  20 Units Subcutaneous BID AC & HS   ipratropium-albuterol  3 mL Nebulization TID   isosorbide-hydrALAZINE  0.5 tablet Oral TID   latanoprost  1 drop Both Eyes QHS   liver oil-zinc oxide   Topical BID   mouth rinse  15 mL Mouth Rinse BID   nicotine  7 mg Transdermal Daily   pantoprazole  40 mg Oral BID   polyethylene glycol  17 g Oral Daily   potassium chloride  40 mEq Oral BID   rosuvastatin  20 mg Oral q1800   sodium chloride flush  10-40 mL Intracatheter Q12H   sodium chloride flush  3 mL Intravenous Q12H   spironolactone  25 mg Oral Daily   Continuous Infusions:  sodium chloride     furosemide (LASIX) infusion 15 mg/hr (03/23/19 1139)     LOS: 17 days   Marylu Lund, MD [facility] Pager On Amion  If 7PM-7AM, please contact night-coverage 03/23/2019, 11:56 AM

## 2019-03-24 ENCOUNTER — Telehealth: Payer: Self-pay

## 2019-03-24 LAB — BASIC METABOLIC PANEL
Anion gap: 14 (ref 5–15)
BUN: 94 mg/dL — ABNORMAL HIGH (ref 8–23)
CO2: 37 mmol/L — ABNORMAL HIGH (ref 22–32)
Calcium: 8.9 mg/dL (ref 8.9–10.3)
Chloride: 88 mmol/L — ABNORMAL LOW (ref 98–111)
Creatinine, Ser: 2.23 mg/dL — ABNORMAL HIGH (ref 0.61–1.24)
GFR calc Af Amer: 33 mL/min — ABNORMAL LOW (ref >60)
GFR calc non Af Amer: 29 mL/min — ABNORMAL LOW (ref >60)
Glucose, Bld: 143 mg/dL — ABNORMAL HIGH (ref 70–99)
Potassium: 3.4 mmol/L — ABNORMAL LOW (ref 3.5–5.1)
Sodium: 139 mmol/L (ref 135–145)

## 2019-03-24 LAB — CBC
HCT: 29.6 % — ABNORMAL LOW (ref 39.0–52.0)
Hemoglobin: 8.7 g/dL — ABNORMAL LOW (ref 13.0–17.0)
MCH: 25.7 pg — ABNORMAL LOW (ref 26.0–34.0)
MCHC: 29.4 g/dL — ABNORMAL LOW (ref 30.0–36.0)
MCV: 87.6 fL (ref 80.0–100.0)
Platelets: 390 10*3/uL (ref 150–400)
RBC: 3.38 MIL/uL — ABNORMAL LOW (ref 4.22–5.81)
RDW: 24.9 % — ABNORMAL HIGH (ref 11.5–15.5)
WBC: 8.5 10*3/uL (ref 4.0–10.5)
nRBC: 0.2 % (ref 0.0–0.2)

## 2019-03-24 LAB — GLUCOSE, CAPILLARY
Glucose-Capillary: 116 mg/dL — ABNORMAL HIGH (ref 70–99)
Glucose-Capillary: 212 mg/dL — ABNORMAL HIGH (ref 70–99)
Glucose-Capillary: 114 mg/dL — ABNORMAL HIGH (ref 70–99)
Glucose-Capillary: 201 mg/dL — ABNORMAL HIGH (ref 70–99)

## 2019-03-24 LAB — COOXEMETRY PANEL
Carboxyhemoglobin: 2 % — ABNORMAL HIGH (ref 0.5–1.5)
Methemoglobin: 1.2 % (ref 0.0–1.5)
O2 Saturation: 66.3 %
Total hemoglobin: 9.5 g/dL — ABNORMAL LOW (ref 12.0–16.0)

## 2019-03-24 MED ORDER — POTASSIUM CHLORIDE CRYS ER 20 MEQ PO TBCR
40.0000 meq | EXTENDED_RELEASE_TABLET | Freq: Two times a day (BID) | ORAL | Status: DC
  Administered 2019-03-24 (×2): 40 meq via ORAL
  Filled 2019-03-24: qty 2

## 2019-03-24 MED ORDER — OXYCODONE-ACETAMINOPHEN 5-325 MG PO TABS
1.0000 | ORAL_TABLET | Freq: Four times a day (QID) | ORAL | Status: DC | PRN
  Administered 2019-03-24 – 2019-03-25 (×4): 1 via ORAL
  Filled 2019-03-24 (×4): qty 1

## 2019-03-24 MED ORDER — POTASSIUM CHLORIDE CRYS ER 20 MEQ PO TBCR
40.0000 meq | EXTENDED_RELEASE_TABLET | Freq: Once | ORAL | Status: AC
  Administered 2019-03-24: 40 meq via ORAL
  Filled 2019-03-24: qty 2

## 2019-03-24 MED ORDER — METOLAZONE 5 MG PO TABS
5.0000 mg | ORAL_TABLET | Freq: Once | ORAL | Status: AC
  Administered 2019-03-24: 5 mg via ORAL
  Filled 2019-03-24: qty 1

## 2019-03-24 MED ORDER — LIDOCAINE-PRILOCAINE 2.5-2.5 % EX CREA
TOPICAL_CREAM | Freq: Two times a day (BID) | CUTANEOUS | Status: DC | PRN
  Filled 2019-03-24: qty 5

## 2019-03-24 NOTE — Progress Notes (Signed)
Occupational Therapy Treatment Patient Details Name: Nicholas Caldwell MRN: [id number] DOB: 19-Jan-1946 Today's Date: 03/24/2019    History of present illness Pt is a 73 y.o. male admitted 03/06/19 with acute on chronic heart failure and respiratory failure. Found to have severe iron deficiency anemia; now s/p small bowel enteroscopy and biopsy 5/12. Plan for DCCV 5/15. PMH includes CHF, chronic anemia, HLD, obesity, CAD, NICM, polyneuropathy, DM, COPD.   OT comments  Pt progressing towards OT goals. Pt found in bed full of urine and agreeable to transfer to chair min A and bath. Pt able to wash the front of himself (upper thighs) and OT washed back, back of thighs and below knees. Pt left in chair eating dinner. OT will continue to follow acutely. Encouraged CIR as I think he would benefit greatly and is most appropriate venue to maximize safety and independence in ADL and functional transfers.    Follow Up Recommendations  CIR;Supervision/Assistance - 24 hour    Equipment Recommendations  Other (comment)(defer to next venue of care)    Recommendations for Other Services      Precautions / Restrictions Precautions Precautions: Fall Restrictions Weight Bearing Restrictions: No       Mobility Bed Mobility Overal bed mobility: Needs Assistance Bed Mobility: Sit to Supine     Supine to sit: Mod assist;HOB elevated     General bed mobility comments: mod A for trunk elevation  Transfers Overall transfer level: Needs assistance Equipment used: Rolling walker (2 wheeled) Transfers: Sit to/from [facility] Sit to Stand: Min assist Stand pivot transfers: Min assist       General transfer comment: increased time and effort, use of momentum, cueing for anterior weight shift; assistance needed for safety and stability with transitions    Balance Overall balance assessment: Needs assistance Sitting-balance support: No upper extremity supported;Feet supported Sitting  balance-Leahy Scale: Fair     Standing balance support: Bilateral upper extremity supported Standing balance-Leahy Scale: Poor Standing balance comment: dependent on RW and additional external A from therapist                           ADL either performed or assessed with clinical judgement   ADL Overall ADL's : Needs assistance/impaired         Upper Body Bathing: Set up;Sitting Upper Body Bathing Details (indicate cue type and reason): for front Lower Body Bathing: Moderate assistance;Sit to/from stand Lower Body Bathing Details (indicate cue type and reason): Mod A sit<>stand from recliner for back of legs, Pt able to complete inner thighs and perianal area Upper Body Dressing : Minimal assistance;Sitting Upper Body Dressing Details (indicate cue type and reason): to don new gown     Toilet Transfer: Minimal assistance;Stand-pivot;BSC   Toileting- Clothing Manipulation and Hygiene: Moderate assistance;Sit to/from stand               Vision       Perception     Praxis      Cognition Arousal/Alertness: Awake/alert Behavior During Therapy: Flat affect Overall Cognitive Status: Impaired/Different from baseline Area of Impairment: Safety/judgement                         Safety/Judgement: Decreased awareness of safety;Decreased awareness of deficits     General Comments: Pt lying in urine and unaware (even though awake)        Exercises     Shoulder Instructions  General Comments      Pertinent Vitals/ Pain       Pain Assessment: Faces Faces Pain Scale: Hurts even more Pain Location: abdomen Pain Descriptors / Indicators: Discomfort;Sore Pain Intervention(s): Monitored during session;Repositioned  Home Living                                          Prior Functioning/Environment              Frequency  Min 2X/week        Progress Toward Goals  OT Goals(current goals can now be found in  the care plan section)  Progress towards OT goals: Progressing toward goals  Acute Rehab OT Goals Patient Stated Goal: to go home OT Goal Formulation: With patient Time For Goal Achievement: 04/05/19  Plan Discharge plan remains appropriate;Frequency remains appropriate    Co-evaluation                 AM-PAC OT "6 Clicks" Daily Activity     Outcome Measure   Help from another person eating meals?: A Little Help from another person taking care of personal grooming?: A Little Help from another person toileting, which includes using toliet, bedpan, or urinal?: A Lot Help from another person bathing (including washing, rinsing, drying)?: A Lot Help from another person to put on and taking off regular upper body clothing?: A Lot Help from another person to put on and taking off regular lower body clothing?: Total 6 Click Score: 13    End of Session Equipment Utilized During Treatment: Gait belt;Rolling walker;Oxygen  OT Visit Diagnosis: Unsteadiness on feet (R26.81);Other abnormalities of gait and mobility (R26.89);Muscle weakness (generalized) (M62.81);Pain Pain - part of body: (abdomen)   Activity Tolerance Patient limited by fatigue   Patient Left in bed;with call bell/phone within reach;with bed alarm set   Nurse Communication          Time: 0459-9774 OT Time Calculation (min): 33 min  Charges: OT General Charges $OT Visit: 1 Visit OT Treatments $Self Care/Home Management : 23-37 mins  Hulda Humphrey OTR/L Acute Rehabilitation Services Pager: [phone] Office: [place] 03/24/2019, 5:30 PM

## 2019-03-24 NOTE — TOC Progression Note (Addendum)
Transition of Care [facility]) - Progression Note    Patient Details  Name: Nicholas Caldwell MRN: [id number] Date of Birth: [birth year]  [facility] CM/SW Contact  [name], RN Phone Number: 03/24/2019, 4:28 PM  Clinical Narrative:   Discharge plan now is for CIR with SNF as back up plan.  Pt remains on IV lasix drip    Expected Discharge Plan: [place] Barriers to Discharge: No Barriers Identified  Expected Discharge Plan and Services Expected Discharge Plan: [place] In-house Referral: NA Discharge Planning Services: CM Consult Post Acute Care Choice: NA Living arrangements for the past 2 months: Single Family Home                 DME Arranged: N/A DME Agency: NA       HH Arranged: RN, Disease Management, PT [place] Agency: [place] ([place]) Date HH Agency Contacted: 03/07/19 Time [place]: 1058 Representative spoke with at [place]: Hydrographic surveyor   Social Determinants of Health ([place]) Interventions    Readmission Risk Interventions No flowsheet data found.

## 2019-03-24 NOTE — Progress Notes (Signed)
Patient ID: Nicholas Caldwell, male   DOB: [birth year], 73 y.o.   MRN: [id number]     Advanced Heart Failure Rounding Note  PCP-Cardiologist: No primary care provider on file.   Subjective:    Now off milrinone with co-ox 66%.  Nicholas Caldwell is on Lasix gtt and got metolazone yesterday.  I/Os not particularly negative but weight down 4 lbs this morning. CVP 18 on nurse's check earlier, line not functioning currently.  BUN/creatinine 88/2.12 => 94/2.23.   Not short of breath at rest, main complaint is back pain.    Nicholas Caldwell remains in NSR on Eliquis.  Hgb 8.4 => 8.1 => 8.7.   MRI head with small chronic infarctions in cerebellum and small acute/early subacute infarctions left posterior frontal lobe.    Objective:   Weight Range: 124.1 kg Body mass index is 37.11 kg/m.   Vital Signs:   Temp:  [97.8 F (36.6 C)-98.3 F (36.8 C)] 97.8 F (36.6 C) (05/21 0700) Pulse Rate:  [59-79] 59 (05/21 0421) Resp:  [18-22] 18 (05/21 0421) BP: (101-112)/(51-56) 104/53 (05/21 0421) SpO2:  [92 %-98 %] 96 % (05/21 0822) Weight:  [124.1 kg] 124.1 kg (05/21 0421) Last BM Date: 03/23/19(per pt)  Weight change: Filed Weights   03/22/19 0500 03/23/19 0500 03/24/19 0421  Weight: 123.2 kg 125.8 kg 124.1 kg    Intake/Output:   Intake/Output Summary (Last 24 hours) at 03/24/2019 0927 Last data filed at 03/24/2019 4098 Gross per 24 hour  Intake 576.56 ml  Output 1000 ml  Net -423.44 ml      Physical Exam    General: NAD Neck: JVP 14-16 cm, no thyromegaly or thyroid nodule.  Lungs: Clear to auscultation bilaterally with normal respiratory effort. CV: Nondisplaced PMI.  Heart regular S1/S2, no S3/S4, no murmur.  Legs wrapped, 1+ ankle edema.   Abdomen: Soft, nontender, no hepatosplenomegaly, no distention.  Skin: Intact without lesions or rashes.  Neurologic: Alert and oriented x 3.  Psych: Normal affect. Extremities: No clubbing or cyanosis.  HEENT: Normal.    Telemetry   NSR with PACs (personally  reviewed).   Labs    CBC Recent Labs    03/23/19 0454 03/24/19 0444  WBC 9.5 8.5  HGB 8.1* 8.7*  HCT 27.7* 29.6*  MCV 88.5 87.6  PLT 364 119   Basic Metabolic Panel Recent Labs    03/22/19 0418 03/23/19 0454 03/24/19 0444  NA 139 139 139  K 3.8 4.3 3.4*  CL 91* 92* 88*  CO2 37* 35* 37*  GLUCOSE 49* 54* 143*  BUN 93* 88* 94*  CREATININE 2.39* 2.12* 2.23*  CALCIUM 8.7* 8.6* 8.9  MG 2.4 2.4  --    Liver Function Tests No results for input(s): AST, ALT, ALKPHOS, BILITOT, PROT, ALBUMIN in the last 72 hours. No results for input(s): LIPASE, AMYLASE in the last 72 hours. Cardiac Enzymes No results for input(s): CKTOTAL, CKMB, CKMBINDEX, TROPONINI in the last 72 hours.  BNP: BNP (last 3 results) Recent Labs    12/01/18 0015 12/30/18 1621 03/06/19 1829  BNP 1,666.7* 1,273.0* 1,020.1*    ProBNP (last 3 results) No results for input(s): PROBNP in the last 8760 hours.   D-Dimer No results for input(s): DDIMER in the last 72 hours. Hemoglobin A1C Recent Labs    03/22/19 0419  HGBA1C 6.8*   Fasting Lipid Panel No results for input(s): CHOL, HDL, LDLCALC, TRIG, CHOLHDL, LDLDIRECT in the last 72 hours. Thyroid Function Tests No results for input(s): TSH, T4TOTAL, T3FREE, THYROIDAB in the  last 72 hours.  Invalid input(s): FREET3  Other results:   Imaging    No results found.   Medications:     Scheduled Medications: . apixaban  5 mg Oral BID  . arformoterol  15 mcg Nebulization BID  . bisoprolol  2.5 mg Oral Daily  . budesonide (PULMICORT) nebulizer solution  0.5 mg Nebulization BID  . docusate sodium  100 mg Oral BID  . ferrous sulfate  325 mg Oral BID WC  . folic acid  1 mg Oral Daily  . gabapentin  600 mg Oral BID  . Gerhardt's butt cream   Topical Daily  . guaiFENesin  400 mg Oral BID  . hydrocortisone  25 mg Rectal BID  . insulin aspart  0-15 Units Subcutaneous TID WC  . insulin aspart  0-5 Units Subcutaneous QHS  . insulin aspart  8  Units Subcutaneous TID WC  . insulin NPH Human  20 Units Subcutaneous BID AC & HS  . ipratropium-albuterol  3 mL Nebulization TID  . isosorbide-hydrALAZINE  0.5 tablet Oral TID  . latanoprost  1 drop Both Eyes QHS  . liver oil-zinc oxide   Topical BID  . mouth rinse  15 mL Mouth Rinse BID  . nicotine  7 mg Transdermal Daily  . pantoprazole  40 mg Oral BID  . polyethylene glycol  17 g Oral Daily  . potassium chloride  40 mEq Oral BID  . potassium chloride  40 mEq Oral Once  . rosuvastatin  20 mg Oral q1800  . sodium chloride flush  10-40 mL Intracatheter Q12H  . sodium chloride flush  3 mL Intravenous Q12H  . spironolactone  25 mg Oral Daily    Infusions: . sodium chloride    . furosemide (LASIX) infusion 15 mg/hr (03/24/19 0628)    PRN Medications: sodium chloride, acetaminophen, acetylcysteine, alum & mag hydroxide-simeth, diazepam, fluticasone, hydrocortisone cream, lidocaine-prilocaine, ondansetron (ZOFRAN) IV, oxyCODONE-acetaminophen, sodium chloride flush, sodium chloride flush   Assessment/Plan   1. Acute on chronic systolic CHF: Nonischemic cardiomyopathy.  With prominent RV failure, likely related to COPD/OSA/OHS. TEE in 3/20 with EF 40-45%, severely dilated RV with moderately decreased systolic function. Nicholas Caldwell has had multiple recent admissions with CHF and COPD exacerbations.  Nicholas Caldwell has not cooperated with paramedicine or [facility] home health as outpatient, has not been using CPAP per his wife, suspect significant medication noncompliance as well though Nicholas Caldwell denies.  Milrinone started 5/9 for RV support. Now off milrinone with co-ox 66%. Nicholas Caldwell is back on Lasix gtt at 15 mg/hr, I/Os not very negative but weight down 4 lbs.  CVP 18 for nurse, I am unable to measure.  With RV failure, suspect we will not get CVP to normal range but still significantly volume overloaded. BUN/creatinine mildly higher today, 94/2.23. Difficult to explain how Nicholas Caldwell is net negative 27,000 cc this admission with  diuresis but weight is minimally changed compared to initial weight.   - Nicholas Caldwell can stay off milrinone.   - I will try to diurese him aggressively for 1 more day, Lasix gtt at 15 mg/hr and give him metolazone 5 x 1. Will go back to po diuretics tomorrow.  - Continue UNNA boots - Continue bisoprolol 2.5 daily.  - Decreased Bidil to 0.5 tab tid with occasional soft BP.  - Continue spironolactone 25 mg daily.    - Fluid restrict.  - Cardiomems may be helpful as outpatient.  - I think his long-term prognosis, unfortunately, will be poor with significant RV dysfunction and  history of poor compliance.  2. Anemia: Baseline hgb around 8.  6.7 at admission, denies overt GI bleeding.  Nicholas Caldwell has had 2 unit PRBCs and IV Fe.  Hgb 7.4 today, lower but no overt bleeding.  Colonoscopy 5/8 unremarkable but EGD showed necrotic mucosa gastric fundus/body that is concerning for gastric cancer, biopsies taken which were negative for malignancy. Hgb 8.7 today.  - Continue Eliquis - Has received IV iron per primary team and GI 3. Atrial fibrillation/flutter: Patient has history of paroxysmal atrial fibrillation.  This admission, Nicholas Caldwell appears to be in an atypical atrial flutter.  Rate is currently controlled. Xarelto initially held with GI bleeding.  Was seen in past by Dr. Rayann Heman, not thought to be good ablation candidate.  Nicholas Caldwell has not been on Tikosyn or amiodarone due to history of long QT. Converted back to NSR on 5/16, in NSR today.  Fewer runs of atrial fibrillation off milrinone.  - Will eventually ask EP to reconsider ablation given lack of good anti-arrhythmic options. Nicholas Caldwell seems to do better in NSR.  - Continue Eliquis.  4. COPD: Severe, on 3L home oxygen.  Nicholas Caldwell was wheezing initially but now much improved.  This may be due to pulmonary edema, but also consider component of COPD exacerbation.  - Per primary team  5. OHS/OSA: On home oxygen.  Per wife, has not been using CPAP at home.  Will give CPAP at night here.  6. ID:  Suspect this is primarily a CHF exacerbation.  Afebrile and WBCs not significantly elevated.  1 blood culture positive for what appears to be coagulase negative Staph (probably contaminant).  COVID-19 negative.  7. CAD: Nonobstructive on prior cath.  No s/s ischemia. Nicholas Caldwell is on Crestor. Off ASA with DOAC and GI bleed  8. Acute on chronic kidney failure III: Baseline seems to be 1.3-1.6.  Creatinine better today at 2.1 but still above baseline. .  9. DM2 with poor control - TRH managing  10. CVA: MRI head showed both small acute/subacute and old CVAs.  Neuro has seen, suspect related to atrial fibrillation.  - Nicholas Caldwell has started on Eliquis (was on Xarelto prior).   Length of Stay: 1  Loralie Champagne, MD  03/24/2019, 9:27 AM  Advanced Heart Failure Team Pager [phone] (M-F; 7a - 4p)  Please contact [facility] for night-coverage after hours (4p -7a ) and weekends on [email or web address]

## 2019-03-24 NOTE — Telephone Encounter (Signed)
-----  Message from Hughie Closs, RN sent at 03/18/2019  2:53 PM EDT ----- Call patient, when out of hospital to schedule F/U clinic visit with Dr. Bryan Lemma  3-5 wks. Out (6/2-6/16)

## 2019-03-24 NOTE — Progress Notes (Signed)
Orthopedic Tech Progress Note Patient Details:  Nicholas Caldwell Sep 15, 1946 [id number]  Ortho Devices Type of Ortho Device: Ace wrap Ortho Device/Splint Location: Bilateral unna boots Ortho Device/Splint Interventions: Application   Post Interventions Patient Tolerated: Well Instructions Provided: Care of device   Nicholas Caldwell 03/24/2019, 7:11 PM

## 2019-03-24 NOTE — Telephone Encounter (Signed)
Patient still in hospital. Staff message to check on f/u clinic visit with Dr. Bryan Lemma in my box

## 2019-03-24 NOTE — Care Management Important Message (Signed)
Important Message  Patient Details  Name: Nicholas Caldwell MRN: [id number] Date of Birth: 06-02-1946   Medicare Important Message Given:  Yes    [name] 03/24/2019, 10:19 AM

## 2019-03-24 NOTE — Progress Notes (Signed)
PROGRESS NOTE    Nicholas Caldwell  YIR:[id number] DOB: Jun 16, 1946 DOA: 03/06/2019 PCP: [facility]    Brief Narrative:  73 y.o.Mwith CAD, isch CM EF 40%, sdCHF, pAF on Xarelto, pHTN, MO, OSA not compliant with CPAP, COPD on home O2 3L, and smoking who presented with swelling and dyspnea.  Patient had had slow progressive dyspnea, SOB with exertion, also significant peripheral edema. In ER, BNP >1000, CXR with edema, HR 124 in Afib, and new anemia Hgb 6.7 g/dL. Transfused and started on Lasix.  Assessment & Plan:   Principal Problem:   Acute on chronic respiratory failure with hypoxia and hypercapnia (HCC) Active Problems:   Essential hypertension   Type 2 diabetes mellitus with neuropathy   PAF (paroxysmal atrial fibrillation) (HCC)   COPD (chronic obstructive pulmonary disease) (HCC)   Medically noncompliant   Atrial fibrillation with RVR (HCC)   AKI (acute kidney injury) ([place])   Acute on chronic anemia   Acute on chronic systolic (congestive) heart failure (HCC)   Acute on chronic systolic CHF (congestive heart failure) (HCC)   Iron deficiency anemia   NSVT (nonsustained ventricular tachycardia) (HCC)   Tobacco abuse counseling   Gastritis and gastroduodenitis   Cerebral embolism with cerebral infarction   Acute versus subacute ischemic stroke, likely cardioembolic Overnight, patient was apparently confused and nonverbal for a few seconds.  Patient states he was angry at nurses and was just not communicating.  Rapid response was initiated with a CT head notable for small bilateral cerebellar infarcts, question chronic versus acute.  EEG with no seizure activity.  MR brain notable for small group of left frontal acute versus early subacute strokes in the setting of a flutter/atrial fibrillation. --Neurology had been following --Lipid panel 5/2 w/ TC 83, LDL 18, HDL 43, TG 109 --HbA1c 6.07 Nov 2018 --Bilateral carotid Dopplers with no significant findings --continue  crestor as tolerated --Continue anticoagulation with Eliquis. No sign of acute bleeding --continue to monitor on telemetry --Continue home gabapentin as tolerated  Acute on chronic hypoxic respiratory failure: resolved Patient presenting from home with progressive dyspnea associated with worsening lower extremity edema.  Likely multifactorial from acute CHF exacerbation and COPD flare, as well as anemia, NSVT/AF.  Chest x-ray on admission notable for bilateral interstitial and alveolar airspace opacities with cardiomegaly. --Cont to wean O2 as tolerated --continues with breathing treatments. Remains on 4LNC this AM  Acute on chronic systolic and diastolic CHF EF 50-09% on TEE in Mar.  --Cardiology/HF team following, appreciate assistance --Transitioned metolazone, Acetazolamide and Lasix gtt to torsemide 29m PO BID on 5/18 by cards due to increase in creatinine --Now back on lasix gtt per Cardiology --ContinueCrestor --ContinueBB as tolerated --Continue on Cardiac, low-salt diet, fluid restrict to 1500 mL's per day -net neg 23.5L, although largely unchanged weight. Diuresis per Cardiology  Atrial fibrillation/atypical aflutter --Continues with persistent A. fib/flutter on telemetry.  Cardiology following, appreciate assistance.  Has been discussed with EP, not a optimal ablation candidate, may consider in the future. --Initially started on heparin drip on 03/15/2019; transitioned to Eliquis 5/14 --Hgb stable, 8.2 today --continue beta-blocker --Continue to monitor on telemetry -Vitals reviewed. Remains rate controlled  Acute kidney injury Creatinine baseline 1.2 - 1.6 for the last 6 months roughly, trending up from 0.9 last few years. AKI again this hospitalization, has been increasing last few days with diuresis. -Cr presently stable just over 2 -Repeat bmet in AM  Iron deficiency anemia Acute blood loss anemia Upper GI bleed Suspected chronic GI  blood lossfrom gastric  ulcer.  Received 2 units PRBCs during the hospitalization. EGD 5/12 with multiple nonbleeding erosions, one nonbleeding superficial gastric ulcer with clean ulcer base --Pathology from stomach biopsy negative for metaplasia, dysplasia or malignancy, no H. pylori --s/p Feraheme 5/4, 5/12 --Continue protonix 40m BID x 12 weeks --Minimize NSAID use --Hgb stable 8.6-->8.4-->8.2-->8.4, no signs of bleeding --Continue to monitor daily CBC, especially now on Eliquis --GI recommends outpatient follow-up and consideration of gastric mapping in the future as an outpatient; will need follow-up with Dr. CBryan Lemma --No evidence of acute bleeding at this time  Acute COPD exacerbation Patient with home O2 dependence at 3 L per nasal cannula. --completed course of steroids and doxycycline --Pulmicort/Brovana --Continue nebs prn --Chest PT, Mucinex, Mucomyst prn --Continue to titrate supplemental oxygen to maintain SPO2 greater than 88% -Currently on 4LNC, cont to wean  Positive blood culture --CoNS in 1/2. Likely contaminant.  Hypokalemia -K 3.8 today,  --Continue potassium 40 mEq twice daily  --recheck bmet in AM  Diabetes Hemoglobin A1c 6.5 on 11/13/2018.  Home regimen includes metformin 1000 mg p.o. twice daily, NPH 70-30 5u BID.  Glucose poorly controlled during hospitalization, likely exacerbated by need of IV steroids for COPD exacerbation as above.  Also with likely dietary noncompliance. --Continues to be hypoglycemic --NPH continued on 20u SQ BID --Novolog continued on 8u TIDAC --Cont on SSI coverage --Glucose trends seem improved. Had been hypoglycemic past several days. Would continue current regimen for now. Labs reviewed  Hypertension Coronary disease secondary prevention -ContinueCrestor, BB as tolerated  Paroxysmal atrial fibrillation NSVT CHA2DS2-Vasc =4 (Age, CHF, PVD, DM). -continue anticoagulation with Eliquis -ContinueBB as patient tolerates  OHS/OSA/MO  --Noncompliant with CPAP at home per report, but has been compliant while inpatient --Continue to encourage compliance with CPAP nocturnally  Chronic pain -Chief complaint seems to be primarily pain medications -Will cautiously continue home dosed opioid regimen. NVega Altareviewed  Constipation -Patient reports good results with lactulose -Would continue cathartics as needed   DVT prophylaxis: SCD's Code Status: Full Family Communication: Pt in room, family not at bedside Disposition Plan: Remains uncertain at this time, CIR eval pending  Consultants:   Cardiology  CIR  GI  Procedures:     Antimicrobials: Anti-infectives (From admission, onward)   Start     Dose/Rate Route Frequency Ordered Stop   03/11/19 1000  doxycycline (VIBRA-TABS) tablet 100 mg  Status:  Discontinued     100 mg Oral Every 12 hours 03/11/19 0733 03/18/19 0646   03/06/19 1945  ceFEPIme (MAXIPIME) 2 g in sodium chloride 0.9 % 100 mL IVPB     2 g 200 mL/hr over 30 Minutes Intravenous  Once 03/06/19 1941 03/06/19 2046   03/06/19 1945  vancomycin (VANCOCIN) 2,000 mg in sodium chloride 0.9 % 500 mL IVPB     2,000 mg 250 mL/hr over 120 Minutes Intravenous  Once 03/06/19 1941 03/07/19 0015      Subjective: Still primarily complaining about wanting more pain medications, placing much less concern over heart failure or any other active medical issues above  Objective: Vitals:   03/24/19 0418 03/24/19 0421 03/24/19 0700 03/24/19 0822  BP: (!) 104/53 (!) 104/53    Pulse: 75 (!) 59    Resp: (!) 22 18    Temp: 98.2 F (36.8 C)  97.8 F (36.6 C)   TempSrc: Oral     SpO2: 97% 98%  96%  Weight:  124.1 kg    Height:  Intake/Output Summary (Last 24 hours) at 03/24/2019 1059 Last data filed at 03/24/2019 0900 Gross per 24 hour  Intake 776.56 ml  Output 1000 ml  Net -223.44 ml   Filed Weights   03/22/19 0500 03/23/19 0500 03/24/19 0421  Weight: 123.2 kg 125.8 kg 124.1 kg    Examination:  General exam: Awake, sitting in bed, in nad Respiratory system: Normal respiratory effort, no wheezing Cardiovascular system: regular rate, s1, s2 Gastrointestinal system: distended,, positive BS, nontender Central nervous system: CN2-12 grossly intact, strength intact Extremities: Perfused, no clubbing Skin: Normal skin turgor, no notable skin lesions seen Psychiatry: Mood normal // no visual hallucinations   Data Reviewed: I have personally reviewed following labs and imaging studies  CBC: Recent Labs  Lab 03/20/19 1037 03/21/19 0500 03/22/19 0418 03/23/19 0454 03/24/19 0444  WBC 11.5* 10.9* 10.8* 9.5 8.5  HGB 8.5* 8.2* 8.4* 8.1* 8.7*  HCT 29.3* 28.9* 29.7* 27.7* 29.6*  MCV 89.1 89.5 89.2 88.5 87.6  PLT 344 334 372 364 992   Basic Metabolic Panel: Recent Labs  Lab 03/19/19 0500 03/20/19 0500  03/20/19 1037 03/21/19 0500 03/22/19 0418 03/23/19 0454 03/24/19 0444  NA 135 124*  --  135 137 139 139 139  K 3.8 4.0  --  4.4 4.8 3.8 4.3 3.4*  CL 85* 80*  --  89* 89* 91* 92* 88*  CO2 35* 32  --  34* 36* 37* 35* 37*  GLUCOSE 343* 571*   < > 233* 158* 49* 54* 143*  BUN 72* 69*  --  80* 92* 93* 88* 94*  CREATININE 1.94* 1.88*  --  2.11* 2.61* 2.39* 2.12* 2.23*  CALCIUM 8.7* 7.7*  --  8.5* 8.6* 8.7* 8.6* 8.9  MG 2.1 1.9  --   --  2.4 2.4 2.4  --    < > = values in this interval not displayed.   GFR: Estimated Creatinine Clearance: 41.9 mL/min (A) (by C-G formula based on SCr of 2.23 mg/dL (H)). Liver Function Tests: No results for input(s): AST, ALT, ALKPHOS, BILITOT, PROT, ALBUMIN in the last 168 hours. No results for input(s): LIPASE, AMYLASE in the last 168 hours. No results for input(s): AMMONIA in the last 168 hours. Coagulation Profile: No results for input(s): INR, PROTIME in the last 168 hours. Cardiac Enzymes: No results for input(s): CKTOTAL, CKMB, CKMBINDEX, TROPONINI in the last 168 hours. BNP (last 3 results) No results for input(s): PROBNP in the last  8760 hours. HbA1C: Recent Labs    03/22/19 0419  HGBA1C 6.8*   CBG: Recent Labs  Lab 03/23/19 0925 03/23/19 1136 03/23/19 1614 03/23/19 2120 03/24/19 0625  GLUCAP 207* 254* 103* 201* 116*   Lipid Profile: No results for input(s): CHOL, HDL, LDLCALC, TRIG, CHOLHDL, LDLDIRECT in the last 72 hours. Thyroid Function Tests: No results for input(s): TSH, T4TOTAL, FREET4, T3FREE, THYROIDAB in the last 72 hours. Anemia Panel: No results for input(s): VITAMINB12, FOLATE, FERRITIN, TIBC, IRON, RETICCTPCT in the last 72 hours. Sepsis Labs: No results for input(s): PROCALCITON, LATICACIDVEN in the last 168 hours.  No results found for this or any previous visit (from the past 240 hour(s)).   Radiology Studies: No results found.  Scheduled Meds: . apixaban  5 mg Oral BID  . arformoterol  15 mcg Nebulization BID  . bisoprolol  2.5 mg Oral Daily  . budesonide (PULMICORT) nebulizer solution  0.5 mg Nebulization BID  . docusate sodium  100 mg Oral BID  . ferrous sulfate  325 mg Oral  BID WC  . folic acid  1 mg Oral Daily  . gabapentin  600 mg Oral BID  . Gerhardt's butt cream   Topical Daily  . guaiFENesin  400 mg Oral BID  . hydrocortisone  25 mg Rectal BID  . insulin aspart  0-15 Units Subcutaneous TID WC  . insulin aspart  0-5 Units Subcutaneous QHS  . insulin aspart  8 Units Subcutaneous TID WC  . insulin NPH Human  20 Units Subcutaneous BID AC & HS  . ipratropium-albuterol  3 mL Nebulization TID  . isosorbide-hydrALAZINE  0.5 tablet Oral TID  . latanoprost  1 drop Both Eyes QHS  . liver oil-zinc oxide   Topical BID  . mouth rinse  15 mL Mouth Rinse BID  . nicotine  7 mg Transdermal Daily  . pantoprazole  40 mg Oral BID  . polyethylene glycol  17 g Oral Daily  . potassium chloride  40 mEq Oral Once  . potassium chloride  40 mEq Oral BID  . rosuvastatin  20 mg Oral q1800  . sodium chloride flush  10-40 mL Intracatheter Q12H  . sodium chloride flush  3 mL Intravenous Q12H   . spironolactone  25 mg Oral Daily   Continuous Infusions: . sodium chloride    . furosemide (LASIX) infusion 15 mg/hr (03/24/19 0628)     LOS: 18 days   Marylu Lund, MD [facility] Pager On Amion  If 7PM-7AM, please contact night-coverage 03/24/2019, 10:59 AM

## 2019-03-24 NOTE — Progress Notes (Signed)
Inpatient Rehab Admissions Coordinator:   I met with patient at bedside.  He continues to consider CIR, but would like to discuss with his wife and get back to me tomorrow.  Note pt still being aggressively diuresed today with plans for possible transition to PO tomorrow.  Will f/u with patient tomorrow for determination for possible admission pending bed availability and pt agreement.    Shann Medal, PT, DPT Admissions Coordinator [phone] 03/24/19  1:03 PM

## 2019-03-25 ENCOUNTER — Inpatient Hospital Stay (HOSPITAL_COMMUNITY): Payer: Medicare Other

## 2019-03-25 LAB — BASIC METABOLIC PANEL
Anion gap: 14 (ref 5–15)
BUN: 87 mg/dL — ABNORMAL HIGH (ref 8–23)
CO2: 37 mmol/L — ABNORMAL HIGH (ref 22–32)
Calcium: 8.4 mg/dL — ABNORMAL LOW (ref 8.9–10.3)
Chloride: 90 mmol/L — ABNORMAL LOW (ref 98–111)
Creatinine, Ser: 1.88 mg/dL — ABNORMAL HIGH (ref 0.61–1.24)
GFR calc Af Amer: 41 mL/min — ABNORMAL LOW (ref >60)
GFR calc non Af Amer: 35 mL/min — ABNORMAL LOW (ref >60)
Glucose, Bld: 189 mg/dL — ABNORMAL HIGH (ref 70–99)
Potassium: 3.3 mmol/L — ABNORMAL LOW (ref 3.5–5.1)
Sodium: 141 mmol/L (ref 135–145)

## 2019-03-25 LAB — GLUCOSE, CAPILLARY
Glucose-Capillary: 158 mg/dL — ABNORMAL HIGH (ref 70–99)
Glucose-Capillary: 223 mg/dL — ABNORMAL HIGH (ref 70–99)
Glucose-Capillary: 86 mg/dL (ref 70–99)
Glucose-Capillary: 331 mg/dL — ABNORMAL HIGH (ref 70–99)

## 2019-03-25 MED ORDER — OXYCODONE-ACETAMINOPHEN 5-325 MG PO TABS
1.0000 | ORAL_TABLET | Freq: Once | ORAL | Status: AC
  Administered 2019-03-25: 1 via ORAL
  Filled 2019-03-25: qty 1

## 2019-03-25 MED ORDER — METOLAZONE 5 MG PO TABS
5.0000 mg | ORAL_TABLET | Freq: Two times a day (BID) | ORAL | Status: AC
  Administered 2019-03-25 (×2): 5 mg via ORAL
  Filled 2019-03-25 (×2): qty 1

## 2019-03-25 MED ORDER — OXYCODONE-ACETAMINOPHEN 5-325 MG PO TABS
2.0000 | ORAL_TABLET | Freq: Four times a day (QID) | ORAL | Status: DC | PRN
  Administered 2019-03-25 – 2019-03-31 (×19): 2 via ORAL
  Filled 2019-03-25 (×21): qty 2

## 2019-03-25 MED ORDER — POTASSIUM CHLORIDE CRYS ER 20 MEQ PO TBCR
40.0000 meq | EXTENDED_RELEASE_TABLET | ORAL | Status: AC
  Administered 2019-03-25 (×4): 40 meq via ORAL
  Filled 2019-03-25 (×4): qty 2

## 2019-03-25 MED ORDER — POTASSIUM CHLORIDE CRYS ER 20 MEQ PO TBCR
40.0000 meq | EXTENDED_RELEASE_TABLET | Freq: Two times a day (BID) | ORAL | Status: DC
  Filled 2019-03-25: qty 2

## 2019-03-25 NOTE — Progress Notes (Signed)
Pt already wearing CPAP when RT came by.

## 2019-03-25 NOTE — Progress Notes (Signed)
Physical Therapy Treatment Patient Details Name: Nicholas Caldwell MRN: [id number] DOB: 1946-04-05 Today's Date: 03/25/2019    History of Present Illness Pt is a 73 y.o. male admitted 03/06/19 with acute on chronic heart failure and respiratory failure. Found to have severe iron deficiency anemia; now s/p small bowel enteroscopy and biopsy 5/12. Plan for DCCV 5/15. PMH includes CHF, chronic anemia, HLD, obesity, CAD, NICM, polyneuropathy, DM, COPD.    PT Comments    Pt making steady progress with functional mobility and tolerated gait training this session. Pt remains limited due to fatigue. All VSS throughout with pt on 4L of O2 at rest with increase to 6L with activity. Pt would continue to benefit from skilled physical therapy services at this time while admitted and after d/c to address the below listed limitations in order to improve overall safety and independence with functional mobility.    Follow Up Recommendations  CIR     Equipment Recommendations  None recommended by PT    Recommendations for Other Services       Precautions / Restrictions Precautions Precautions: Fall Restrictions Weight Bearing Restrictions: No    Mobility  Bed Mobility               General bed mobility comments: pt OOB in recliner chair upon arrival  Transfers Overall transfer level: Needs assistance Equipment used: Rolling walker (2 wheeled) Transfers: Sit to/from Stand Sit to Stand: Min guard         General transfer comment: min guard for safety, good technique  Ambulation/Gait Ambulation/Gait assistance: Min guard Gait Distance (Feet): 50 Feet(50' + 20' x2 with standing rest breaks) Assistive device: Rolling walker (2 wheeled) Gait Pattern/deviations: Step-through pattern;Decreased step length - right;Decreased step length - left;Decreased stride length;Trunk flexed Gait velocity: decreased   General Gait Details: pt overall with slow, cautious and steady gait; no LOB or need for  physical assistance, close min guard for safety; pt limited secondary to fatigue   Stairs             Wheelchair Mobility    Modified Rankin (Stroke Patients Only)       Balance Overall balance assessment: Needs assistance Sitting-balance support: No upper extremity supported;Feet supported Sitting balance-Leahy Scale: Fair     Standing balance support: Bilateral upper extremity supported Standing balance-Leahy Scale: Poor                              Cognition Arousal/Alertness: Awake/alert Behavior During Therapy: Flat affect Overall Cognitive Status: Impaired/Different from baseline Area of Impairment: Safety/judgement                         Safety/Judgement: Decreased awareness of safety;Decreased awareness of deficits            Exercises      General Comments        Pertinent Vitals/Pain Pain Assessment: Faces Faces Pain Scale: Hurts little more Pain Location: abdomen Pain Descriptors / Indicators: Discomfort;Sore Pain Intervention(s): Monitored during session;Repositioned;RN gave pain meds during session    Home Living                      Prior Function            PT Goals (current goals can now be found in the care plan section) Acute Rehab PT Goals PT Goal Formulation: With patient Time For Goal Achievement: 03/24/19 Potential  to Achieve Goals: Good Progress towards PT goals: Progressing toward goals    Frequency    Min 3X/week      PT Plan Current plan remains appropriate    Co-evaluation              AM-PAC PT "6 Clicks" Mobility   Outcome Measure  Help needed turning from your back to your side while in a flat bed without using bedrails?: A Lot Help needed moving from lying on your back to sitting on the side of a flat bed without using bedrails?: A Lot Help needed moving to and from a bed to a chair (including a wheelchair)?: A Little Help needed standing up from a chair using  your arms (e.g., wheelchair or bedside chair)?: A Little Help needed to walk in hospital room?: A Little Help needed climbing 3-5 steps with a railing? : Total 6 Click Score: 14    End of Session Equipment Utilized During Treatment: Gait belt;Oxygen Activity Tolerance: Patient limited by fatigue Patient left: in chair;with call bell/phone within reach Nurse Communication: Mobility status PT Visit Diagnosis: Unsteadiness on feet (R26.81);Muscle weakness (generalized) (M62.81)     Time: 1504-1364 PT Time Calculation (min) (ACUTE ONLY): 32 min  Charges:  $Gait Training: 8-22 mins $Therapeutic Activity: 8-22 mins                     Sherie Don, [place], DPT  Acute Rehabilitation Services Pager [phone] Office [place] 03/25/2019, 1:11 PM

## 2019-03-25 NOTE — Progress Notes (Signed)
PROGRESS NOTE    Nicholas Caldwell  BSJ:[id number] DOB: 1946/10/23 DOA: 03/06/2019 PCP: [facility]    Brief Narrative:  73 y.o.Mwith CAD, isch CM EF 40%, sdCHF, pAF on Xarelto, pHTN, MO, OSA not compliant with CPAP, COPD on home O2 3L, and smoking who presented with swelling and dyspnea.  Patient had had slow progressive dyspnea, SOB with exertion, also significant peripheral edema. In ER, BNP >1000, CXR with edema, HR 124 in Afib, and new anemia Hgb 6.7 g/dL. Transfused and started on Lasix.  Assessment & Plan:   Principal Problem:   Acute on chronic respiratory failure with hypoxia and hypercapnia (HCC) Active Problems:   Essential hypertension   Type 2 diabetes mellitus with neuropathy   PAF (paroxysmal atrial fibrillation) (HCC)   COPD (chronic obstructive pulmonary disease) (HCC)   Medically noncompliant   Atrial fibrillation with RVR (HCC)   AKI (acute kidney injury) ([place])   Acute on chronic anemia   Acute on chronic systolic (congestive) heart failure (HCC)   Acute on chronic systolic CHF (congestive heart failure) (HCC)   Iron deficiency anemia   NSVT (nonsustained ventricular tachycardia) (HCC)   Tobacco abuse counseling   Gastritis and gastroduodenitis   Cerebral embolism with cerebral infarction   Acute versus subacute ischemic stroke, likely cardioembolic Overnight, patient was apparently confused and nonverbal for a few seconds.  Patient states he was angry at nurses and was just not communicating.  Rapid response was initiated with a CT head notable for small bilateral cerebellar infarcts, question chronic versus acute.  EEG with no seizure activity.  MR brain notable for small group of left frontal acute versus early subacute strokes in the setting of a flutter/atrial fibrillation. --Neurology had been following --Lipid panel 5/2 w/ TC 83, LDL 18, HDL 43, TG 109 --HbA1c 6.07 Nov 2018 --Bilateral carotid Dopplers with no significant findings --continue  crestor as tolerated --Continue anticoagulation with Eliquis. No sign of acute bleeding --continue to monitor on telemetry --Continued on  home gabapentin as tolerated  Acute on chronic hypoxic respiratory failure: resolved Patient presenting from home with progressive dyspnea associated with worsening lower extremity edema.  Likely multifactorial from acute CHF exacerbation and COPD flare, as well as anemia, NSVT/AF.  Chest x-ray on admission notable for bilateral interstitial and alveolar airspace opacities with cardiomegaly. --Cont to wean O2 as tolerated --continues with breathing treatments as tolerated. Currently on 4LNC this AM  Acute on chronic systolic and diastolic CHF EF 76-54% on TEE in Mar.  --Cardiology/HF team following, appreciate assistance --Transitioned metolazone, Acetazolamide and Lasix gtt to torsemide 38m PO BID on 5/18 by cards due to increase in creatinine --Now back on lasix gtt per Cardiology --ContinueCrestor --ContinueBB as tolerated --Continue on Cardiac, low-salt diet, fluid restrict to 1500 mL's per day -net neg 25L, Cardiology plans for one more day of IV lasix, possible transition to PO afterwards  Atrial fibrillation/atypical aflutter --Continues with persistent A. fib/flutter on telemetry.  Cardiology following, appreciate assistance.  Has been discussed with EP, not a optimal ablation candidate, may consider in the future. --Initially started on heparin drip on 03/15/2019; transitioned to Eliquis 5/14 --Hgb stable, 8.7 on 5/21 --continue beta-blocker --Continue to monitor on telemetry -Vitals reviewed. Remains rate controlled  Acute kidney injury Creatinine baseline 1.2 - 1.6 for the last 6 months roughly, trending up from 0.9 last few years. AKI again this hospitalization, has been increasing last few days with diuresis. -Cr presently stable just over 2 -Repeat bmet in AM  Iron  deficiency anemia Acute blood loss anemia Upper GI bleed  Suspected chronic GI blood lossfrom gastric ulcer.  Received 2 units PRBCs during the hospitalization. EGD 5/12 with multiple nonbleeding erosions, one nonbleeding superficial gastric ulcer with clean ulcer base --Pathology from stomach biopsy negative for metaplasia, dysplasia or malignancy, no H. pylori --s/p Feraheme 5/4, 5/12 --Continue protonix 41m BID x 12 weeks --Minimize NSAID use --Continue to monitor daily CBC, especially now on Eliquis --GI recommends outpatient follow-up and consideration of gastric mapping in the future as an outpatient; will need follow-up with Dr. CBryan Lemma --Without evidence of acute bleeding at this time  Acute COPD exacerbation Patient with home O2 dependence at 3 L per nasal cannula. --completed course of steroids and doxycycline --Pulmicort/Brovana --Continue nebs prn --Chest PT, Mucinex, Mucomyst prn --Continue to titrate supplemental oxygen to maintain SPO2 greater than 88% -Remains on 4LNC, cont to wean  Positive blood culture --CoNS in 1/2. Likely contaminant.  Hypokalemia -K 3.3 today,  --Continue potassium 40 mEq twice daily  --repeat bmet in AM  Diabetes Hemoglobin A1c 6.5 on 11/13/2018.  Home regimen includes metformin 1000 mg p.o. twice daily, NPH 70-30 5u BID.  Glucose poorly controlled during hospitalization, likely exacerbated by need of IV steroids for COPD exacerbation as above.  Also with likely dietary noncompliance. --NPH continued on 20u SQ BID --Novolog continued on 8u TIDAC --Cont on SSI coverage --Glucose trends seems to be improved without hypoglycemia. Had been hypoglycemic past several days. Would continue current regimen for now. Labs reviewed  Hypertension Coronary disease secondary prevention -Continuewith Crestor, BB as tolerated  Paroxysmal atrial fibrillation NSVT CHA2DS2-Vasc =4 (Age, CHF, PVD, DM). -continue anticoagulation with Eliquis -Continue withBB as patient tolerates  OHS/OSA/MO  --Historically noncompliant with CPAP at home per report, but has been compliant while inpatient --Continue to encourage compliance with CPAP nocturnally  Chronic pain -Chief complaint seems to be primarily pain medications -NCCSR reviewed and med rec reviewed. Pt on 136mpercocet q6hrs. Will resume as tolerated  Constipation -Patient reports good results with lactulose -Continue with cathartics as needed   DVT prophylaxis: SCD's Code Status: Full Family Communication: Pt in room, family not at bedside Disposition Plan: Remains uncertain at this time, CIR currently evaluating  Consultants:   Cardiology  CIR  GI  Procedures:     Antimicrobials: Anti-infectives (From admission, onward)   Start     Dose/Rate Route Frequency Ordered Stop   03/11/19 1000  doxycycline (VIBRA-TABS) tablet 100 mg  Status:  Discontinued     100 mg Oral Every 12 hours 03/11/19 0733 03/18/19 0646   03/06/19 1945  ceFEPIme (MAXIPIME) 2 g in sodium chloride 0.9 % 100 mL IVPB     2 g 200 mL/hr over 30 Minutes Intravenous  Once 03/06/19 1941 03/06/19 2046   03/06/19 1945  vancomycin (VANCOCIN) 2,000 mg in sodium chloride 0.9 % 500 mL IVPB     2,000 mg 250 mL/hr over 120 Minutes Intravenous  Once 03/06/19 1941 03/07/19 0015      Subjective: Primary concerns remain to be wanting more pain medications, almost disregarding all other life-threatening medical issues  Objective: Vitals:   03/25/19 0315 03/25/19 0735 03/25/19 0745 03/25/19 1048  BP: 113/82  (!) 94/54 (!) 103/43  Pulse: 69  72   Resp: (!) 22   (!) 25  Temp: 97.6 F (36.4 C)  98 F (36.7 C) 97.9 F (36.6 C)  TempSrc: Oral  Axillary Axillary  SpO2: 98% 97% 100%   Weight: 121.6 kg  Height:        Intake/Output Summary (Last 24 hours) at 03/25/2019 1059 Last data filed at 03/25/2019 1002 Gross per 24 hour  Intake 1393.66 ml  Output 2275 ml  Net -881.34 ml   Filed Weights   03/23/19 0500 03/24/19 0421 03/25/19 0315   Weight: 125.8 kg 124.1 kg 121.6 kg    Examination: General exam: Conversant, in no acute distress Respiratory system: normal chest rise, clear, no audible wheezing Cardiovascular system: regular rhythm, s1-s2 Gastrointestinal system: Nondistended, nontender, pos BS Central nervous system: No seizures, no tremors Extremities: No cyanosis, no joint deformities Skin: No rashes, no pallor Psychiatry: Affect normal // no auditory hallucinations   Data Reviewed: I have personally reviewed following labs and imaging studies  CBC: Recent Labs  Lab 03/20/19 1037 03/21/19 0500 03/22/19 0418 03/23/19 0454 03/24/19 0444  WBC 11.5* 10.9* 10.8* 9.5 8.5  HGB 8.5* 8.2* 8.4* 8.1* 8.7*  HCT 29.3* 28.9* 29.7* 27.7* 29.6*  MCV 89.1 89.5 89.2 88.5 87.6  PLT 344 334 372 364 354   Basic Metabolic Panel: Recent Labs  Lab 03/19/19 0500 03/20/19 0500  03/21/19 0500 03/22/19 0418 03/23/19 0454 03/24/19 0444 03/25/19 0500  NA 135 124*   < > 137 139 139 139 141  K 3.8 4.0   < > 4.8 3.8 4.3 3.4* 3.3*  CL 85* 80*   < > 89* 91* 92* 88* 90*  CO2 35* 32   < > 36* 37* 35* 37* 37*  GLUCOSE 343* 571*   < > 158* 49* 54* 143* 189*  BUN 72* 69*   < > 92* 93* 88* 94* 87*  CREATININE 1.94* 1.88*   < > 2.61* 2.39* 2.12* 2.23* 1.88*  CALCIUM 8.7* 7.7*   < > 8.6* 8.7* 8.6* 8.9 8.4*  MG 2.1 1.9  --  2.4 2.4 2.4  --   --    < > = values in this interval not displayed.   GFR: Estimated Creatinine Clearance: 49.2 mL/min (A) (by C-G formula based on SCr of 1.88 mg/dL (H)). Liver Function Tests: No results for input(s): AST, ALT, ALKPHOS, BILITOT, PROT, ALBUMIN in the last 168 hours. No results for input(s): LIPASE, AMYLASE in the last 168 hours. No results for input(s): AMMONIA in the last 168 hours. Coagulation Profile: No results for input(s): INR, PROTIME in the last 168 hours. Cardiac Enzymes: No results for input(s): CKTOTAL, CKMB, CKMBINDEX, TROPONINI in the last 168 hours. BNP (last 3 results) No  results for input(s): PROBNP in the last 8760 hours. HbA1C: No results for input(s): HGBA1C in the last 72 hours. CBG: Recent Labs  Lab 03/24/19 0625 03/24/19 1112 03/24/19 1627 03/24/19 2142 03/25/19 0634  GLUCAP 116* 212* 114* 201* 158*   Lipid Profile: No results for input(s): CHOL, HDL, LDLCALC, TRIG, CHOLHDL, LDLDIRECT in the last 72 hours. Thyroid Function Tests: No results for input(s): TSH, T4TOTAL, FREET4, T3FREE, THYROIDAB in the last 72 hours. Anemia Panel: No results for input(s): VITAMINB12, FOLATE, FERRITIN, TIBC, IRON, RETICCTPCT in the last 72 hours. Sepsis Labs: No results for input(s): PROCALCITON, LATICACIDVEN in the last 168 hours.  No results found for this or any previous visit (from the past 240 hour(s)).   Radiology Studies: No results found.  Scheduled Meds: . apixaban  5 mg Oral BID  . arformoterol  15 mcg Nebulization BID  . bisoprolol  2.5 mg Oral Daily  . budesonide (PULMICORT) nebulizer solution  0.5 mg Nebulization BID  . docusate sodium  100 mg  Oral BID  . ferrous sulfate  325 mg Oral BID WC  . folic acid  1 mg Oral Daily  . gabapentin  600 mg Oral BID  . Gerhardt's butt cream   Topical Daily  . guaiFENesin  400 mg Oral BID  . hydrocortisone  25 mg Rectal BID  . insulin aspart  0-15 Units Subcutaneous TID WC  . insulin aspart  0-5 Units Subcutaneous QHS  . insulin aspart  8 Units Subcutaneous TID WC  . insulin NPH Human  20 Units Subcutaneous BID AC & HS  . ipratropium-albuterol  3 mL Nebulization TID  . isosorbide-hydrALAZINE  0.5 tablet Oral TID  . latanoprost  1 drop Both Eyes QHS  . liver oil-zinc oxide   Topical BID  . mouth rinse  15 mL Mouth Rinse BID  . metolazone  5 mg Oral BID  . nicotine  7 mg Transdermal Daily  . oxyCODONE-acetaminophen  1 tablet Oral Once  . pantoprazole  40 mg Oral BID  . polyethylene glycol  17 g Oral Daily  . potassium chloride  40 mEq Oral Q4H  . [START ON 03/26/2019] potassium chloride  40 mEq Oral  BID  . rosuvastatin  20 mg Oral q1800  . sodium chloride flush  10-40 mL Intracatheter Q12H  . sodium chloride flush  3 mL Intravenous Q12H  . spironolactone  25 mg Oral Daily   Continuous Infusions: . sodium chloride    . furosemide (LASIX) infusion 15 mg/hr (03/25/19 0334)     LOS: 19 days   Marylu Lund, MD [facility] Pager On Amion  If 7PM-7AM, please contact night-coverage 03/25/2019, 10:59 AM

## 2019-03-25 NOTE — Progress Notes (Signed)
Patient ID: Nicholas Caldwell, male   DOB: May 19, 1946, 73 y.o.   MRN: [id number]     Advanced Heart Failure Rounding Note  PCP-Cardiologist: No primary care provider on file.   Subjective:    Now off milrinone, co-ox has been ok.  He is on Lasix gtt at 15 m/hr and got metolazone 5 mg x 1 yesterday.  I/Os not particularly negative but weight down 5 lbs this morning (standing weight). CVP still 18 on my check this morning.  BUN/creatinine 88/2.12 => 94/2.23 => 87/1.88.   Not short of breath at rest, main complaint is still back pain.    He went back into atrial fibrillation with rate in 80s this morning.  Remains on Eliquis. Did not get CBC today.   MRI head with small chronic infarctions in cerebellum and small acute/early subacute infarctions left posterior frontal lobe.    Objective:   Weight Range: 121.6 kg Body mass index is 36.36 kg/m.   Vital Signs:   Temp:  [97.6 F (36.4 C)-98.6 F (37 C)] 98 F (36.7 C) (05/22 0745) Pulse Rate:  [69-81] 72 (05/22 0745) Resp:  [18-23] 22 (05/22 0315) BP: (94-113)/(51-82) 94/54 (05/22 0745) SpO2:  [96 %-100 %] 100 % (05/22 0745) Weight:  [121.6 kg] 121.6 kg (05/22 0315) Last BM Date: 03/24/19  Weight change: Filed Weights   03/23/19 0500 03/24/19 0421 03/25/19 0315  Weight: 125.8 kg 124.1 kg 121.6 kg    Intake/Output:   Intake/Output Summary (Last 24 hours) at 03/25/2019 0758 Last data filed at 03/25/2019 0330 Gross per 24 hour  Intake 1593.66 ml  Output 1850 ml  Net -256.34 ml      Physical Exam    General: NAD Neck: JVP 14+, no thyromegaly or thyroid nodule.  Lungs: Rhonchi bilaterally.  CV: Nondisplaced PMI.  Heart irregular S1/S2, no S3/S4, no murmur.  Legs wrapped, trace ankle edema.  Abdomen: Soft, nontender, no hepatosplenomegaly, no distention.  Skin: Intact without lesions or rashes.  Neurologic: Alert and oriented x 3.  Psych: Normal affect. Extremities: No clubbing or cyanosis.  HEENT: Normal.    Telemetry   Atrial fibrillation rate 80s (personally reviewed).   Labs    CBC Recent Labs    03/23/19 0454 03/24/19 0444  WBC 9.5 8.5  HGB 8.1* 8.7*  HCT 27.7* 29.6*  MCV 88.5 87.6  PLT 364 213   Basic Metabolic Panel Recent Labs    03/23/19 0454 03/24/19 0444 03/25/19 0500  NA 139 139 141  K 4.3 3.4* 3.3*  CL 92* 88* 90*  CO2 35* 37* 37*  GLUCOSE 54* 143* 189*  BUN 88* 94* 87*  CREATININE 2.12* 2.23* 1.88*  CALCIUM 8.6* 8.9 8.4*  MG 2.4  --   --    Liver Function Tests No results for input(s): AST, ALT, ALKPHOS, BILITOT, PROT, ALBUMIN in the last 72 hours. No results for input(s): LIPASE, AMYLASE in the last 72 hours. Cardiac Enzymes No results for input(s): CKTOTAL, CKMB, CKMBINDEX, TROPONINI in the last 72 hours.  BNP: BNP (last 3 results) Recent Labs    12/01/18 0015 12/30/18 1621 03/06/19 1829  BNP 1,666.7* 1,273.0* 1,020.1*    ProBNP (last 3 results) No results for input(s): PROBNP in the last 8760 hours.   D-Dimer No results for input(s): DDIMER in the last 72 hours. Hemoglobin A1C No results for input(s): HGBA1C in the last 72 hours. Fasting Lipid Panel No results for input(s): CHOL, HDL, LDLCALC, TRIG, CHOLHDL, LDLDIRECT in the last 72 hours. Thyroid Function  Tests No results for input(s): TSH, T4TOTAL, T3FREE, THYROIDAB in the last 72 hours.  Invalid input(s): FREET3  Other results:   Imaging    No results found.   Medications:     Scheduled Medications: . apixaban  5 mg Oral BID  . arformoterol  15 mcg Nebulization BID  . bisoprolol  2.5 mg Oral Daily  . budesonide (PULMICORT) nebulizer solution  0.5 mg Nebulization BID  . docusate sodium  100 mg Oral BID  . ferrous sulfate  325 mg Oral BID WC  . folic acid  1 mg Oral Daily  . gabapentin  600 mg Oral BID  . Gerhardt's butt cream   Topical Daily  . guaiFENesin  400 mg Oral BID  . hydrocortisone  25 mg Rectal BID  . insulin aspart  0-15 Units Subcutaneous TID WC  . insulin aspart   0-5 Units Subcutaneous QHS  . insulin aspart  8 Units Subcutaneous TID WC  . insulin NPH Human  20 Units Subcutaneous BID AC & HS  . ipratropium-albuterol  3 mL Nebulization TID  . isosorbide-hydrALAZINE  0.5 tablet Oral TID  . latanoprost  1 drop Both Eyes QHS  . liver oil-zinc oxide   Topical BID  . mouth rinse  15 mL Mouth Rinse BID  . metolazone  5 mg Oral BID  . nicotine  7 mg Transdermal Daily  . pantoprazole  40 mg Oral BID  . polyethylene glycol  17 g Oral Daily  . potassium chloride  40 mEq Oral Q4H  . rosuvastatin  20 mg Oral q1800  . sodium chloride flush  10-40 mL Intracatheter Q12H  . sodium chloride flush  3 mL Intravenous Q12H  . spironolactone  25 mg Oral Daily    Infusions: . sodium chloride    . furosemide (LASIX) infusion 15 mg/hr (03/25/19 0334)    PRN Medications: sodium chloride, acetaminophen, acetylcysteine, alum & mag hydroxide-simeth, diazepam, fluticasone, hydrocortisone cream, lidocaine-prilocaine, ondansetron (ZOFRAN) IV, oxyCODONE-acetaminophen, sodium chloride flush, sodium chloride flush   Assessment/Plan   1. Acute on chronic systolic CHF: Nonischemic cardiomyopathy.  With prominent RV failure, likely related to COPD/OSA/OHS. TEE in 3/20 with EF 40-45%, severely dilated RV with moderately decreased systolic function. He has had multiple recent admissions with CHF and COPD exacerbations.  He has not cooperated with paramedicine or [facility] home [facility] as outpatient, has not been using CPAP per his wife, suspect significant medication noncompliance as well though he denies.  Milrinone started 5/9 for RV support. Now off milrinone with adequate co-ox. He is back on Lasix gtt at 15 mg/hr, I/Os not very negative but weight down 5 lbs (standing weight per patient).  CVP 18 on my measure today.  With RV failure, suspect we will not get CVP to normal range but still significantly volume overloaded. BUN/creatinine lower today.  With stable to improved renal  function and volume overload, continue diuresis today rather than switching to po.  - He can stay off milrinone.   - I will try to diurese him aggressively for at least 1 more day, Lasix gtt at 15 mg/hr and give him metolazone 5 mg bid today.   - Continue UNNA boots - Continue bisoprolol 2.5 daily.  - Decreased Bidil to 0.5 tab tid with occasional soft BP.  - Continue spironolactone 25 mg daily.    - Fluid restrict.  - Cardiomems may be helpful as outpatient.  - I think his long-term prognosis, unfortunately, will be poor with significant RV dysfunction and  history of poor compliance.  2. Anemia: Baseline hgb around 8.  6.7 at admission, denies overt GI bleeding.  He has had 2 unit PRBCs and IV Fe.  Hgb 7.4 today, lower but no overt bleeding.  Colonoscopy 5/8 unremarkable but EGD showed necrotic mucosa gastric fundus/body that is concerning for gastric cancer, biopsies taken which were negative for malignancy. Hgb 8.7 yesterday.  - Continue Eliquis - Has received IV iron per primary team and GI 3. Atrial fibrillation/flutter: Patient has history of paroxysmal atrial fibrillation.  This admission, he appears to be in an atypical atrial flutter.  Rate is currently controlled. Xarelto initially held with GI bleeding.  Was seen in past by Dr. Rayann Heman, not thought to be good ablation candidate.  He has not been on Tikosyn or amiodarone due to history of long QT. Converted back to NSR on 5/16 but back in atrial fibrillation today. - May eventually ask EP to reconsider ablation given lack of good anti-arrhythmic options. He seems to do better in NSR.  - Continue Eliquis.  4. COPD: Severe, on 3L home oxygen.  He was wheezing initially but now much improved.  This may be due to pulmonary edema, but also consider component of COPD exacerbation.  - Per primary team  5. OHS/OSA: On home oxygen.  Per wife, has not been using CPAP at home.  Will give CPAP at night here.  6. ID: Suspect this is primarily a CHF  exacerbation.  Afebrile and WBCs not significantly elevated.  1 blood culture positive for what appears to be coagulase negative Staph (probably contaminant).  COVID-19 negative.  7. CAD: Nonobstructive on prior cath.  No s/s ischemia. He is on Crestor. Off ASA with DOAC and GI bleed  8. Acute on chronic kidney failure III: Baseline seems to be 1.3-1.6.  Creatinine better today at 1.88 but still above baseline. .  9. DM2 with poor control - TRH managing  10. CVA: MRI head showed both small acute/subacute and old CVAs.  Neuro has seen, suspect related to atrial fibrillation.  - He has started on Eliquis (was on Xarelto prior).   Length of Stay: [place], [place]  03/25/2019, 7:58 AM  Advanced Heart Failure Team Pager [phone] (M-F; 7a - 4p)  Please contact [facility] for night-coverage after hours (4p -7a ) and weekends on [email or web address]

## 2019-03-25 NOTE — Progress Notes (Signed)
Inpatient Rehab Admissions Coordinator:   I met with patient at bedside.  Per Einar Crow, plan to continue to aggressively diurese patient at least one more day.  Will plan to follow up with patient on Monday if he remains in house to determine whether CIR is appropriate.  I have attempted to contact his wife, but no voicemail set up and no alternative phone number.  I will continue to attempt to reach her today.    Shann Medal, PT, DPT Admissions Coordinator [phone] 03/25/19  1:09 PM

## 2019-03-26 LAB — CBC WITH DIFFERENTIAL/PLATELET
Abs Immature Granulocytes: 0.03 10*3/uL (ref 0.00–0.07)
Basophils Absolute: 0 10*3/uL (ref 0.0–0.1)
Basophils Relative: 0 %
Eosinophils Absolute: 0.2 10*3/uL (ref 0.0–0.5)
Eosinophils Relative: 4 %
HCT: 28.5 % — ABNORMAL LOW (ref 39.0–52.0)
Hemoglobin: 8.5 g/dL — ABNORMAL LOW (ref 13.0–17.0)
Immature Granulocytes: 1 %
Lymphocytes Relative: 12 %
Lymphs Abs: 0.8 10*3/uL (ref 0.7–4.0)
MCH: 25.9 pg — ABNORMAL LOW (ref 26.0–34.0)
MCHC: 29.8 g/dL — ABNORMAL LOW (ref 30.0–36.0)
MCV: 86.9 fL (ref 80.0–100.0)
Monocytes Absolute: 0.8 10*3/uL (ref 0.1–1.0)
Monocytes Relative: 12 %
Neutro Abs: 4.5 10*3/uL (ref 1.7–7.7)
Neutrophils Relative %: 71 %
Platelets: 406 10*3/uL — ABNORMAL HIGH (ref 150–400)
RBC: 3.28 MIL/uL — ABNORMAL LOW (ref 4.22–5.81)
RDW: 23.9 % — ABNORMAL HIGH (ref 11.5–15.5)
WBC: 6.3 10*3/uL (ref 4.0–10.5)
nRBC: 0.3 % — ABNORMAL HIGH (ref 0.0–0.2)

## 2019-03-26 LAB — BASIC METABOLIC PANEL
Anion gap: 12 (ref 5–15)
BUN: 88 mg/dL — ABNORMAL HIGH (ref 8–23)
CO2: 39 mmol/L — ABNORMAL HIGH (ref 22–32)
Calcium: 8.6 mg/dL — ABNORMAL LOW (ref 8.9–10.3)
Chloride: 88 mmol/L — ABNORMAL LOW (ref 98–111)
Creatinine, Ser: 1.9 mg/dL — ABNORMAL HIGH (ref 0.61–1.24)
GFR calc Af Amer: 40 mL/min — ABNORMAL LOW (ref >60)
GFR calc non Af Amer: 35 mL/min — ABNORMAL LOW (ref >60)
Glucose, Bld: 183 mg/dL — ABNORMAL HIGH (ref 70–99)
Potassium: 3 mmol/L — ABNORMAL LOW (ref 3.5–5.1)
Sodium: 139 mmol/L (ref 135–145)

## 2019-03-26 LAB — GLUCOSE, CAPILLARY
Glucose-Capillary: 156 mg/dL — ABNORMAL HIGH (ref 70–99)
Glucose-Capillary: 123 mg/dL — ABNORMAL HIGH (ref 70–99)
Glucose-Capillary: 44 mg/dL — CL (ref 70–99)
Glucose-Capillary: 327 mg/dL — ABNORMAL HIGH (ref 70–99)

## 2019-03-26 MED ORDER — POTASSIUM CHLORIDE CRYS ER 20 MEQ PO TBCR
40.0000 meq | EXTENDED_RELEASE_TABLET | ORAL | Status: AC
  Administered 2019-03-26 (×4): 40 meq via ORAL
  Filled 2019-03-26 (×3): qty 2

## 2019-03-26 MED ORDER — METOLAZONE 5 MG PO TABS
5.0000 mg | ORAL_TABLET | Freq: Two times a day (BID) | ORAL | Status: AC
  Administered 2019-03-26 (×2): 5 mg via ORAL
  Filled 2019-03-26 (×2): qty 1

## 2019-03-26 NOTE — Progress Notes (Signed)
PROGRESS NOTE    Nicholas Caldwell  RVU:[id number] DOB: May 16, 1946 DOA: 03/06/2019 PCP: [facility]    Brief Narrative:  73 y.o.Mwith CAD, isch CM EF 40%, sdCHF, pAF on Xarelto, pHTN, MO, OSA not compliant with CPAP, COPD on home O2 3L, and smoking who presented with swelling and dyspnea.  Patient had had slow progressive dyspnea, SOB with exertion, also significant peripheral edema. In ER, BNP >1000, CXR with edema, HR 124 in Afib, and new anemia Hgb 6.7 g/dL. Transfused and started on Lasix.  Assessment & Plan:   Principal Problem:   Acute on chronic respiratory failure with hypoxia and hypercapnia (HCC) Active Problems:   Essential hypertension   Type 2 diabetes mellitus with neuropathy   PAF (paroxysmal atrial fibrillation) (HCC)   COPD (chronic obstructive pulmonary disease) (HCC)   Medically noncompliant   Atrial fibrillation with RVR (HCC)   AKI (acute kidney injury) ([place])   Acute on chronic anemia   Acute on chronic systolic (congestive) heart failure (HCC)   Acute on chronic systolic CHF (congestive heart failure) (HCC)   Iron deficiency anemia   NSVT (nonsustained ventricular tachycardia) (HCC)   Tobacco abuse counseling   Gastritis and gastroduodenitis   Cerebral embolism with cerebral infarction   Acute versus subacute ischemic stroke, likely cardioembolic Overnight, patient was apparently confused and nonverbal for a few seconds.  Patient states he was angry at nurses and was just not communicating.  Rapid response was initiated with a CT head notable for small bilateral cerebellar infarcts, question chronic versus acute.  EEG with no seizure activity.  MR brain notable for small group of left frontal acute versus early subacute strokes in the setting of a flutter/atrial fibrillation. --Neurology had been following --Lipid panel 5/2 w/ TC 83, LDL 18, HDL 43, TG 109 --HbA1c 6.07 Nov 2018 --Bilateral carotid Dopplers with no significant findings --continue  crestor as tolerated --Continue anticoagulation with Eliquis. No sign of acute bleeding --continue to monitor on telemetry --Continued on  home gabapentin as tolerated -Presently stable  Acute on chronic hypoxic respiratory failure: resolved Patient presenting from home with progressive dyspnea associated with worsening lower extremity edema.  Likely multifactorial from acute CHF exacerbation and COPD flare, as well as anemia, NSVT/AF.  Chest x-ray on admission notable for bilateral interstitial and alveolar airspace opacities with cardiomegaly. --Cont to wean O2 as tolerated --continues with breathing treatments as tolerated. O2 weaned down to [facility]  Acute on chronic systolic and diastolic CHF EF 61-68% on TEE in Mar.  --Cardiology/HF team following, appreciate assistance --Transitioned metolazone, Acetazolamide and Lasix gtt to torsemide 80m PO BID on 5/18 by cards due to increase in creatinine --Now back on lasix gtt per Cardiology --ContinueCrestor --ContinueBB as tolerated --Continue on Cardiac, low-salt diet, fluid restrict to 1500 mL's per day -net neg 21L, Cardiology plans continued IV lasix as patient remains volume overloaded  Atrial fibrillation/atypical aflutter --Continues with persistent A. fib/flutter on telemetry.  Cardiology following, appreciate assistance.  Has been discussed with EP, not a optimal ablation candidate, may consider in the future. --Initially started on heparin drip on 03/15/2019; transitioned to Eliquis 5/14 --Hgb stable, 8.7 on 5/21 --continue beta-blocker --Continue to monitor on telemetry -Vitals reviewed. Remains rate controlled  Acute kidney injury Creatinine baseline 1.2 - 1.6 for the last 6 months roughly, trending up from 0.9 last few years. AKI again this hospitalization, has been increasing last few days with diuresis. -Cr presently stable just over 2 -Recheck bmet in AM  Iron deficiency anemia  Acute blood loss anemia Upper GI  bleed Suspected chronic GI blood lossfrom gastric ulcer.  Received 2 units PRBCs during the hospitalization. EGD 5/12 with multiple nonbleeding erosions, one nonbleeding superficial gastric ulcer with clean ulcer base --Pathology from stomach biopsy negative for metaplasia, dysplasia or malignancy, no H. pylori --s/p Feraheme 5/4, 5/12 --Continue protonix 4m BID x 12 weeks --Minimize NSAID use --Continue to monitor daily CBC, especially now on Eliquis --GI recommends outpatient follow-up and consideration of gastric mapping in the future as an outpatient; will need follow-up with Dr. CBryan Lemma --No evidence of acute bleeding at this time  Acute COPD exacerbation Patient with home O2 dependence at 3 L per nasal cannula. --completed course of steroids and doxycycline --Pulmicort/Brovana --Continue nebs prn --Chest PT, Mucinex, Mucomyst prn --Continue to titrate supplemental oxygen to maintain SPO2 greater than 88% - O2 has weaned to [facility] Positive blood culture --CoNS in 1/2. Likely contaminant.  Hypokalemia -K 3.3 today,  --Continue potassium 40 mEq twice daily  --recheckt bmet in AM  Diabetes Hemoglobin A1c 6.5 on 11/13/2018.  Home regimen includes metformin 1000 mg p.o. twice daily, NPH 70-30 5u BID.  Glucose poorly controlled during hospitalization, likely exacerbated by need of IV steroids for COPD exacerbation as above.  Also with likely dietary noncompliance. --NPH continued on 20u SQ BID --Novolog continued on 8u TIDAC --Cont on SSI coverage --Glucose trends seems to be improved without hypoglycemia. Had been hypoglycemic past several days. Would continue current regimen for now. Labs reviewed  Hypertension Coronary disease secondary prevention -Continuewith Crestor, BB as tolerated  Paroxysmal atrial fibrillation NSVT CHA2DS2-Vasc =4 (Age, CHF, PVD, DM). -continue anticoagulation with Eliquis -Continue withBB as patient tolerates  OHS/OSA/MO  --Historically noncompliant with CPAP at home per report, but has been compliant while inpatient --Continue to encourage compliance with CPAP nocturnally  Chronic pain -Chief complaint seems to be primarily pain medications -NCCSR reviewed and med rec reviewed. Pt on 151mpercocet q6hrs.  -Cont on 1060mercocet dosing, seems to be tolerating currently  Constipation -Patient reports good results with lactulose -Continue with cathartics as needed -Stable at present   DVT prophylaxis: SCD's Code Status: Full Family Communication: Pt in room, family not at bedside Disposition Plan: Remains uncertain at this time, CIR currently evaluating  Consultants:   Cardiology  CIR  GI  Procedures:     Antimicrobials: Anti-infectives (From admission, onward)   Start     Dose/Rate Route Frequency Ordered Stop   03/11/19 1000  doxycycline (VIBRA-TABS) tablet 100 mg  Status:  Discontinued     100 mg Oral Every 12 hours 03/11/19 0733 03/18/19 0646   03/06/19 1945  ceFEPIme (MAXIPIME) 2 g in sodium chloride 0.9 % 100 mL IVPB     2 g 200 mL/hr over 30 Minutes Intravenous  Once 03/06/19 1941 03/06/19 2046   03/06/19 1945  vancomycin (VANCOCIN) 2,000 mg in sodium chloride 0.9 % 500 mL IVPB     2,000 mg 250 mL/hr over 120 Minutes Intravenous  Once 03/06/19 1941 03/07/19 0015      Subjective: Reports feeling eager to go home early this week  Objective: Vitals:   03/26/19 0805 03/26/19 0810 03/26/19 0825 03/26/19 1039  BP:   (!) 92/56 (!) 99/43  Pulse:      Resp:      Temp:   98.6 F (37 C) 98.3 F (36.8 C)  TempSrc:   Oral Oral  SpO2: 98% 98%    Weight:      Height:  Intake/Output Summary (Last 24 hours) at 03/26/2019 1157 Last data filed at 03/26/2019 1117 Gross per 24 hour  Intake 1628.03 ml  Output 3350 ml  Net -1721.97 ml   Filed Weights   03/24/19 0421 03/25/19 0315 03/26/19 0355  Weight: 124.1 kg 121.6 kg 122.4 kg    Examination: General exam: Awake,  laying in bed, in nad Respiratory system: Normal respiratory effort, no wheezing Cardiovascular system: regular rate, s1, s2 Gastrointestinal system: Soft, nondistended, positive BS Central nervous system: CN2-12 grossly intact, strength intact Extremities: Perfused, no clubbing Skin: Normal skin turgor, no notable skin lesions seen Psychiatry: Mood normal // no visual hallucinations   Data Reviewed: I have personally reviewed following labs and imaging studies  CBC: Recent Labs  Lab 03/21/19 0500 03/22/19 0418 03/23/19 0454 03/24/19 0444 03/26/19 0450  WBC 10.9* 10.8* 9.5 8.5 6.3  NEUTROABS  --   --   --   --  4.5  HGB 8.2* 8.4* 8.1* 8.7* 8.5*  HCT 28.9* 29.7* 27.7* 29.6* 28.5*  MCV 89.5 89.2 88.5 87.6 86.9  PLT 334 372 364 390 086*   Basic Metabolic Panel: Recent Labs  Lab 03/20/19 0500  03/21/19 0500 03/22/19 0418 03/23/19 0454 03/24/19 0444 03/25/19 0500 03/26/19 0450  NA 124*   < > 137 139 139 139 141 139  K 4.0   < > 4.8 3.8 4.3 3.4* 3.3* 3.0*  CL 80*   < > 89* 91* 92* 88* 90* 88*  CO2 32   < > 36* 37* 35* 37* 37* 39*  GLUCOSE 571*   < > 158* 49* 54* 143* 189* 183*  BUN 69*   < > 92* 93* 88* 94* 87* 88*  CREATININE 1.88*   < > 2.61* 2.39* 2.12* 2.23* 1.88* 1.90*  CALCIUM 7.7*   < > 8.6* 8.7* 8.6* 8.9 8.4* 8.6*  MG 1.9  --  2.4 2.4 2.4  --   --   --    < > = values in this interval not displayed.   GFR: Estimated Creatinine Clearance: 48.9 mL/min (A) (by C-G formula based on SCr of 1.9 mg/dL (H)). Liver Function Tests: No results for input(s): AST, ALT, ALKPHOS, BILITOT, PROT, ALBUMIN in the last 168 hours. No results for input(s): LIPASE, AMYLASE in the last 168 hours. No results for input(s): AMMONIA in the last 168 hours. Coagulation Profile: No results for input(s): INR, PROTIME in the last 168 hours. Cardiac Enzymes: No results for input(s): CKTOTAL, CKMB, CKMBINDEX, TROPONINI in the last 168 hours. BNP (last 3 results) No results for input(s):  PROBNP in the last 8760 hours. HbA1C: No results for input(s): HGBA1C in the last 72 hours. CBG: Recent Labs  Lab 03/25/19 1044 03/25/19 1605 03/25/19 2145 03/26/19 0626 03/26/19 1114  GLUCAP 223* 86 331* 156* 123*   Lipid Profile: No results for input(s): CHOL, HDL, LDLCALC, TRIG, CHOLHDL, LDLDIRECT in the last 72 hours. Thyroid Function Tests: No results for input(s): TSH, T4TOTAL, FREET4, T3FREE, THYROIDAB in the last 72 hours. Anemia Panel: No results for input(s): VITAMINB12, FOLATE, FERRITIN, TIBC, IRON, RETICCTPCT in the last 72 hours. Sepsis Labs: No results for input(s): PROCALCITON, LATICACIDVEN in the last 168 hours.  No results found for this or any previous visit (from the past 240 hour(s)).   Radiology Studies: Dg Chest Port 1 View  Result Date: 03/25/2019 CLINICAL DATA:  Cough EXAM: PORTABLE CHEST 1 VIEW COMPARISON:  03/08/2019 FINDINGS: The right-sided PICC line tip terminates over the proximal SVC. The cardiac silhouette  is enlarged. Diffuse hazy bilateral airspace opacities are again noted. There is a dense retrocardiac opacity. Bilateral pleural effusions are noted. There is no pneumothorax. IMPRESSION: 1. Cardiomegaly with findings suspicious for underlying pulmonary edema. An atypical infectious process can have a similar appearance. 2. Retrocardiac opacity favored to represent atelectasis with infiltrate not excluded. 3. Small bilateral pleural effusions. 4. Right-sided PICC line tip terminates over the SVC. Electronically Signed   By: Constance Holster M.D.   On: 03/25/2019 18:33    Scheduled Meds: . apixaban  5 mg Oral BID  . arformoterol  15 mcg Nebulization BID  . bisoprolol  2.5 mg Oral Daily  . budesonide (PULMICORT) nebulizer solution  0.5 mg Nebulization BID  . ferrous sulfate  325 mg Oral BID WC  . folic acid  1 mg Oral Daily  . gabapentin  600 mg Oral BID  . Gerhardt's butt cream   Topical Daily  . guaiFENesin  400 mg Oral BID  . hydrocortisone   25 mg Rectal BID  . insulin aspart  0-15 Units Subcutaneous TID WC  . insulin aspart  0-5 Units Subcutaneous QHS  . insulin aspart  8 Units Subcutaneous TID WC  . insulin NPH Human  20 Units Subcutaneous BID AC & HS  . ipratropium-albuterol  3 mL Nebulization TID  . isosorbide-hydrALAZINE  0.5 tablet Oral TID  . latanoprost  1 drop Both Eyes QHS  . liver oil-zinc oxide   Topical BID  . metolazone  5 mg Oral BID  . pantoprazole  40 mg Oral BID  . polyethylene glycol  17 g Oral Daily  . potassium chloride  40 mEq Oral Q3H  . rosuvastatin  20 mg Oral q1800  . spironolactone  25 mg Oral Daily   Continuous Infusions: . furosemide (LASIX) infusion 15 mg/hr (03/26/19 0700)     LOS: 20 days   Marylu Lund, MD [facility] Pager On Amion  If 7PM-7AM, please contact night-coverage 03/26/2019, 11:57 AM

## 2019-03-26 NOTE — Progress Notes (Signed)
Orthopedic Tech Progress Note Patient Details:  Nicholas Caldwell 1945-12-21 [id number] RN called requesting boots to be changed. On the left leg half way down patient had blood on the boot. After removing it I saw a cut on the leg so I called for the RN to come the a look at it. She said to just clean the leg and apply th boot and that she would keep and eye on it. Ortho Devices Type of Ortho Device: Haematologist Ortho Device/Splint Location: bilateral Ortho Device/Splint Interventions: Adjustment, Application, Ordered   Post Interventions Patient Tolerated: Well Instructions Provided: Care of device, Adjustment of device   [name] 03/26/2019, 3:22 PM

## 2019-03-26 NOTE — Progress Notes (Signed)
Patient ID: Nicholas Caldwell, male   DOB: 1946-04-14, 73 y.o.   MRN: [id number]     Advanced Heart Failure Rounding Note  PCP-Cardiologist: No primary care provider on file.   Subjective:    Now off milrinone, co-ox has been ok.  He is on Lasix gtt at 15 m/hr and got metolazone 5 mg x 2 yesterday.  I/Os negative yesterday. CVP still 18-19 on my check this morning.  BUN/creatinine 88/2.12 => 94/2.23 => 87/1.88 => 88/1.9.   Not short of breath at rest, main complaint is still back pain.    He is back in NSR this morning.   MRI head with small chronic infarctions in cerebellum and small acute/early subacute infarctions left posterior frontal lobe.    Objective:   Weight Range: 122.4 kg Body mass index is 36.6 kg/m.   Vital Signs:   Temp:  [97.9 F (36.6 C)-98.5 F (36.9 C)] 98.5 F (36.9 C) (05/23 0355) Pulse Rate:  [68-76] 68 (05/23 0355) Resp:  [18-25] 18 (05/23 0355) BP: (83-112)/(43-61) 101/54 (05/23 0355) SpO2:  [97 %-100 %] 98 % (05/23 0810) FiO2 (%):  [28 %] 28 % (05/23 0810) Weight:  [122.4 kg] 122.4 kg (05/23 0355) Last BM Date: 03/25/19  Weight change: Filed Weights   03/24/19 0421 03/25/19 0315 03/26/19 0355  Weight: 124.1 kg 121.6 kg 122.4 kg    Intake/Output:   Intake/Output Summary (Last 24 hours) at 03/26/2019 0811 Last data filed at 03/26/2019 0700 Gross per 24 hour  Intake 1810.03 ml  Output 2925 ml  Net -1114.97 ml      Physical Exam    General: NAD Neck: JVP 14+, no thyromegaly or thyroid nodule.  Lungs: Occasional wheeze and decreased BS at bases.  CV: Nondisplaced PMI.  Heart regular S1/S2, no S3/S4, no murmur.  Trace ankle edema.   Abdomen: Soft, nontender, no hepatosplenomegaly, no distention.  Skin: Intact without lesions or rashes.  Neurologic: Alert and oriented x 3.  Psych: Normal affect. Extremities: No clubbing or cyanosis.  HEENT: Normal.    Telemetry   NSR 70s (personally reviewed).   Labs    CBC Recent Labs     03/24/19 0444 03/26/19 0450  WBC 8.5 6.3  NEUTROABS  --  4.5  HGB 8.7* 8.5*  HCT 29.6* 28.5*  MCV 87.6 86.9  PLT 390 901*   Basic Metabolic Panel Recent Labs    03/25/19 0500 03/26/19 0450  NA 141 139  K 3.3* 3.0*  CL 90* 88*  CO2 37* 39*  GLUCOSE 189* 183*  BUN 87* 88*  CREATININE 1.88* 1.90*  CALCIUM 8.4* 8.6*   Liver Function Tests No results for input(s): AST, ALT, ALKPHOS, BILITOT, PROT, ALBUMIN in the last 72 hours. No results for input(s): LIPASE, AMYLASE in the last 72 hours. Cardiac Enzymes No results for input(s): CKTOTAL, CKMB, CKMBINDEX, TROPONINI in the last 72 hours.  BNP: BNP (last 3 results) Recent Labs    12/01/18 0015 12/30/18 1621 03/06/19 1829  BNP 1,666.7* 1,273.0* 1,020.1*    ProBNP (last 3 results) No results for input(s): PROBNP in the last 8760 hours.   D-Dimer No results for input(s): DDIMER in the last 72 hours. Hemoglobin A1C No results for input(s): HGBA1C in the last 72 hours. Fasting Lipid Panel No results for input(s): CHOL, HDL, LDLCALC, TRIG, CHOLHDL, LDLDIRECT in the last 72 hours. Thyroid Function Tests No results for input(s): TSH, T4TOTAL, T3FREE, THYROIDAB in the last 72 hours.  Invalid input(s): FREET3  Other results:  Imaging    Dg Chest Port 1 View  Result Date: 03/25/2019 CLINICAL DATA:  Cough EXAM: PORTABLE CHEST 1 VIEW COMPARISON:  03/08/2019 FINDINGS: The right-sided PICC line tip terminates over the proximal SVC. The cardiac silhouette is enlarged. Diffuse hazy bilateral airspace opacities are again noted. There is a dense retrocardiac opacity. Bilateral pleural effusions are noted. There is no pneumothorax. IMPRESSION: 1. Cardiomegaly with findings suspicious for underlying pulmonary edema. An atypical infectious process can have a similar appearance. 2. Retrocardiac opacity favored to represent atelectasis with infiltrate not excluded. 3. Small bilateral pleural effusions. 4. Right-sided PICC line tip  terminates over the SVC. Electronically Signed   By: Constance Holster M.D.   On: 03/25/2019 18:33     Medications:     Scheduled Medications:  apixaban  5 mg Oral BID   arformoterol  15 mcg Nebulization BID   bisoprolol  2.5 mg Oral Daily   budesonide (PULMICORT) nebulizer solution  0.5 mg Nebulization BID   ferrous sulfate  325 mg Oral BID WC   folic acid  1 mg Oral Daily   gabapentin  600 mg Oral BID   Gerhardt's butt cream   Topical Daily   guaiFENesin  400 mg Oral BID   hydrocortisone  25 mg Rectal BID   insulin aspart  0-15 Units Subcutaneous TID WC   insulin aspart  0-5 Units Subcutaneous QHS   insulin aspart  8 Units Subcutaneous TID WC   insulin NPH Human  20 Units Subcutaneous BID AC & HS   ipratropium-albuterol  3 mL Nebulization TID   isosorbide-hydrALAZINE  0.5 tablet Oral TID   latanoprost  1 drop Both Eyes QHS   liver oil-zinc oxide   Topical BID   metolazone  5 mg Oral BID   nicotine  7 mg Transdermal Daily   pantoprazole  40 mg Oral BID   polyethylene glycol  17 g Oral Daily   potassium chloride  40 mEq Oral Q3H   rosuvastatin  20 mg Oral q1800   spironolactone  25 mg Oral Daily    Infusions:  furosemide (LASIX) infusion 15 mg/hr (03/26/19 0700)    PRN Medications: acetaminophen, acetylcysteine, alum & mag hydroxide-simeth, diazepam, fluticasone, hydrocortisone cream, lidocaine-prilocaine, ondansetron (ZOFRAN) IV, oxyCODONE-acetaminophen, sodium chloride flush   Assessment/Plan   1. Acute on chronic systolic CHF: Nonischemic cardiomyopathy.  With prominent RV failure, likely related to COPD/OSA/OHS. TEE in 3/20 with EF 40-45%, severely dilated RV with moderately decreased systolic function. He has had multiple recent admissions with CHF and COPD exacerbations.  He has not cooperated with paramedicine or [facility] home [facility] as outpatient, has not been using CPAP per his wife, suspect significant medication noncompliance as well  though he denies.  Milrinone started 5/9 for RV support. Now off milrinone with adequate co-ox. He is back on Lasix gtt at 15 mg/hr and got metolazone yesterday.  He is still very volume overloaded, CVP 18-19 on my measure today but diuresed yesterday with negative I/Os and stable renal indices.  With RV failure, suspect we will not get CVP to normal range but still significantly volume overloaded.   - He can stay off milrinone.   - Still quite volume overloaded and renal function stable, continue Lasix gtt at 15 mg/hr and give him metolazone 5 mg bid today.  Aggressive K replacement.  - Continue UNNA boots - Continue bisoprolol 2.5 daily.  - Decreased Bidil to 0.5 tab tid with occasional soft BP.  - Continue spironolactone 25 mg daily.    -  Fluid restrict.  - Cardiomems may be helpful as outpatient.  - I think his long-term prognosis, unfortunately, will be poor with significant RV dysfunction and history of poor compliance.  2. Anemia: Baseline hgb around 8.  6.7 at admission, denies overt GI bleeding.  He has had 2 unit PRBCs and IV Fe.  Hgb 7.4 today, lower but no overt bleeding.  Colonoscopy 5/8 unremarkable but EGD showed necrotic mucosa gastric fundus/body that is concerning for gastric cancer, biopsies taken which were negative for malignancy. Hgb 8.5.  - Continue Eliquis - Has received IV iron per primary team and GI 3. Atrial fibrillation/flutter: Patient has history of paroxysmal atrial fibrillation.  This admission, he appears to be in an atypical atrial flutter.  Rate is currently controlled. Xarelto initially held with GI bleeding.  Was seen in past by Dr. Rayann Heman, not thought to be good ablation candidate.  He has not been on Tikosyn or amiodarone due to history of long QT. He is in NSR today.  - May eventually ask EP to reconsider ablation given lack of good anti-arrhythmic options. He seems to do better in NSR.  - Continue Eliquis.  4. COPD: Severe, on 3L home oxygen.  He was wheezing  initially but now much improved.  This may be due to pulmonary edema, but also consider component of COPD exacerbation.  - Per primary team  5. OHS/OSA: On home oxygen.  Per wife, has not been using CPAP at home.  Will give CPAP at night here.  6. ID: Suspect this is primarily a CHF exacerbation.  Afebrile and WBCs not significantly elevated.  1 blood culture positive for what appears to be coagulase negative Staph (probably contaminant).  COVID-19 negative.  7. CAD: Nonobstructive on prior cath.  No s/s ischemia. He is on Crestor. Off ASA with DOAC and GI bleed  8. Acute on chronic kidney failure III: Baseline seems to be 1.3-1.6.  Creatinine better today at 1.88 but still above baseline. .  9. DM2 with poor control - TRH managing  10. CVA: MRI head showed both small acute/subacute and old CVAs.  Neuro has seen, suspect related to atrial fibrillation.  - He has started on Eliquis (was on Xarelto prior).   Length of Stay: [place], MD  03/26/2019, 8:11 AM  Advanced Heart Failure Team Pager [phone] (M-F; 7a - 4p)  Please contact [facility] for night-coverage after hours (4p -7a ) and weekends on [email or web address]

## 2019-03-27 LAB — BASIC METABOLIC PANEL
Anion gap: 14 (ref 5–15)
BUN: 84 mg/dL — ABNORMAL HIGH (ref 8–23)
CO2: 39 mmol/L — ABNORMAL HIGH (ref 22–32)
Calcium: 8.7 mg/dL — ABNORMAL LOW (ref 8.9–10.3)
Chloride: 86 mmol/L — ABNORMAL LOW (ref 98–111)
Creatinine, Ser: 1.78 mg/dL — ABNORMAL HIGH (ref 0.61–1.24)
GFR calc Af Amer: 44 mL/min — ABNORMAL LOW (ref >60)
GFR calc non Af Amer: 38 mL/min — ABNORMAL LOW (ref >60)
Glucose, Bld: 185 mg/dL — ABNORMAL HIGH (ref 70–99)
Potassium: 3.1 mmol/L — ABNORMAL LOW (ref 3.5–5.1)
Sodium: 139 mmol/L (ref 135–145)

## 2019-03-27 LAB — CBC WITH DIFFERENTIAL/PLATELET
Abs Immature Granulocytes: 0.03 10*3/uL (ref 0.00–0.07)
Basophils Absolute: 0 10*3/uL (ref 0.0–0.1)
Basophils Relative: 1 %
Eosinophils Absolute: 0.2 10*3/uL (ref 0.0–0.5)
Eosinophils Relative: 3 %
HCT: 30.6 % — ABNORMAL LOW (ref 39.0–52.0)
Hemoglobin: 9 g/dL — ABNORMAL LOW (ref 13.0–17.0)
Immature Granulocytes: 1 %
Lymphocytes Relative: 11 %
Lymphs Abs: 0.7 10*3/uL (ref 0.7–4.0)
MCH: 25.6 pg — ABNORMAL LOW (ref 26.0–34.0)
MCHC: 29.4 g/dL — ABNORMAL LOW (ref 30.0–36.0)
MCV: 86.9 fL (ref 80.0–100.0)
Monocytes Absolute: 0.6 10*3/uL (ref 0.1–1.0)
Monocytes Relative: 10 %
Neutro Abs: 4.9 10*3/uL (ref 1.7–7.7)
Neutrophils Relative %: 74 %
Platelets: 413 10*3/uL — ABNORMAL HIGH (ref 150–400)
RBC: 3.52 MIL/uL — ABNORMAL LOW (ref 4.22–5.81)
RDW: 23.8 % — ABNORMAL HIGH (ref 11.5–15.5)
WBC: 6.5 10*3/uL (ref 4.0–10.5)
nRBC: 0 % (ref 0.0–0.2)

## 2019-03-27 LAB — GLUCOSE, CAPILLARY
Glucose-Capillary: 102 mg/dL — ABNORMAL HIGH (ref 70–99)
Glucose-Capillary: 126 mg/dL — ABNORMAL HIGH (ref 70–99)
Glucose-Capillary: 142 mg/dL — ABNORMAL HIGH (ref 70–99)
Glucose-Capillary: 131 mg/dL — ABNORMAL HIGH (ref 70–99)

## 2019-03-27 LAB — MAGNESIUM: Magnesium: 2.5 mg/dL — ABNORMAL HIGH (ref 1.7–2.4)

## 2019-03-27 MED ORDER — DOCUSATE SODIUM 100 MG PO CAPS
100.0000 mg | ORAL_CAPSULE | Freq: Every day | ORAL | Status: DC | PRN
  Administered 2019-03-27 – 2019-03-30 (×4): 100 mg via ORAL
  Filled 2019-03-27 (×5): qty 1

## 2019-03-27 MED ORDER — METOLAZONE 5 MG PO TABS
5.0000 mg | ORAL_TABLET | Freq: Two times a day (BID) | ORAL | Status: AC
  Administered 2019-03-27 (×2): 5 mg via ORAL
  Filled 2019-03-27 (×2): qty 1

## 2019-03-27 MED ORDER — POTASSIUM CHLORIDE CRYS ER 20 MEQ PO TBCR
40.0000 meq | EXTENDED_RELEASE_TABLET | Freq: Once | ORAL | Status: AC
  Administered 2019-03-27: 40 meq via ORAL
  Filled 2019-03-27: qty 2

## 2019-03-27 MED ORDER — POTASSIUM CHLORIDE CRYS ER 20 MEQ PO TBCR
40.0000 meq | EXTENDED_RELEASE_TABLET | ORAL | Status: AC
  Administered 2019-03-27 (×4): 40 meq via ORAL
  Filled 2019-03-27 (×3): qty 2

## 2019-03-27 NOTE — Progress Notes (Signed)
PROGRESS NOTE    Nicholas Caldwell  YFV:[id number] DOB: [birth year] DOA: 03/06/2019 PCP: [facility]    Brief Narrative:  74 y.o.Mwith CAD, isch CM EF 40%, sdCHF, pAF on Xarelto, pHTN, MO, OSA not compliant with CPAP, COPD on home O2 3L, and smoking who presented with swelling and dyspnea.  Patient had had slow progressive dyspnea, SOB with exertion, also significant peripheral edema. In ER, BNP >1000, CXR with edema, HR 124 in Afib, and new anemia Hgb 6.7 g/dL. Transfused and started on Lasix.  Assessment & Plan:   Principal Problem:   Acute on chronic respiratory failure with hypoxia and hypercapnia (HCC) Active Problems:   Essential hypertension   Type 2 diabetes mellitus with neuropathy   PAF (paroxysmal atrial fibrillation) (HCC)   COPD (chronic obstructive pulmonary disease) (HCC)   Medically noncompliant   Atrial fibrillation with RVR (HCC)   AKI (acute kidney injury) (Jud)   Acute on chronic anemia   Acute on chronic systolic (congestive) heart failure (HCC)   Acute on chronic systolic CHF (congestive heart failure) (HCC)   Iron deficiency anemia   NSVT (nonsustained ventricular tachycardia) (HCC)   Tobacco abuse counseling   Gastritis and gastroduodenitis   Cerebral embolism with cerebral infarction   Acute versus subacute ischemic stroke, likely cardioembolic Overnight, patient was apparently confused and nonverbal for a few seconds.  Patient states he was angry at nurses and was just not communicating.  Rapid response was initiated with a CT head notable for small bilateral cerebellar infarcts, question chronic versus acute.  EEG with no seizure activity.  MR brain notable for small group of left frontal acute versus early subacute strokes in the setting of a flutter/atrial fibrillation. --Neurology had been following --Lipid panel 5/2 w/ TC 83, LDL 18, HDL 43, TG 109 --HbA1c 6.07 Nov 2018 --Bilateral carotid Dopplers with no significant findings --continue  crestor as tolerated --Continue anticoagulation with Eliquis. No sign of acute bleeding --continue to monitor on telemetry --Continued on  home gabapentin as tolerated - Stable at present  Acute on chronic hypoxic respiratory failure: resolved Patient presenting from home with progressive dyspnea associated with worsening lower extremity edema.  Likely multifactorial from acute CHF exacerbation and COPD flare, as well as anemia, NSVT/AF.  Chest x-ray on admission notable for bilateral interstitial and alveolar airspace opacities with cardiomegaly. --Cont to wean O2 as tolerated --continues with breathing treatments as tolerated. O2 weaned to [facility] with appropriate O2 sats  Acute on chronic systolic and diastolic CHF EF 16-38% on TEE in Mar.  --Cardiology/HF team following, appreciate assistance --Transitioned metolazone, Acetazolamide and Lasix gtt to torsemide 4m PO BID on 5/18 by cards due to increase in creatinine --Continued on lasix gtt per Cardiology --ContinueCrestor --ContinueBB as tolerated --Patient noted to have multiple snack items at bedside brought by wife yesterday, foods high in salt content. Counseling done at bedside -Discussed case with Cardiologist  Atrial fibrillation/atypical aflutter --Continues with persistent A. fib/flutter on telemetry.  Cardiology following, appreciate assistance.  Has been discussed with EP, not a optimal ablation candidate, may consider in the future. --Initially started on heparin drip on 03/15/2019; transitioned to Eliquis 5/14 --Hgb stable, 8.7 on 5/21 --continue beta-blocker --Continue to monitor on telemetry - Rate controlled at present  Acute kidney injury Creatinine baseline 1.2 - 1.6 for the last 6 months roughly, trending up from 0.9 last few years. AKI again this hospitalization, has been increasing last few days with diuresis. -Cr improved to 1.78 -Repeat bmet in AM  Iron deficiency anemia Acute blood loss anemia  Upper GI bleed Suspected chronic GI blood lossfrom gastric ulcer.  Received 2 units PRBCs during the hospitalization. EGD 5/12 with multiple nonbleeding erosions, one nonbleeding superficial gastric ulcer with clean ulcer base --Pathology from stomach biopsy negative for metaplasia, dysplasia or malignancy, no H. pylori --s/p Feraheme 5/4, 5/12 --Continue protonix 59m BID x 12 weeks --Minimize NSAID use --Continue to monitor daily CBC, especially now on Eliquis --GI recommends outpatient follow-up and consideration of gastric mapping in the future as an outpatient; will need follow-up with Dr. CBryan Lemma --No evidence of acute bleeding  Acute COPD exacerbation Patient with home O2 dependence at 3 L per nasal cannula. --completed course of steroids and doxycycline --Pulmicort/Brovana --Continue nebs prn --Chest PT, Mucinex, Mucomyst prn --Continue to titrate supplemental oxygen to maintain SPO2 greater than 88% - O2 has weaned to [facility] normal O2 sats  Positive blood culture --CoNS in 1/2. Likely contaminant.  Hypokalemia -K 3.1 today,  --Potassium replacement already ordered for today --recheck bmet in AM  Diabetes Hemoglobin A1c 6.5 on 11/13/2018.  Home regimen includes metformin 1000 mg p.o. twice daily, NPH 70-30 5u BID.  Glucose poorly controlled during hospitalization, likely exacerbated by need of IV steroids for COPD exacerbation as above.  Also with likely dietary noncompliance. --NPH continued on 20u SQ BID --Novolog continued on 8u TIDAC --Cont on SSI coverage --Glucose trends seems to be improved overall with one episode of hypoglycemia yesterday -Of note, patient noted to have candy brought to him yesterday.   Hypertension Coronary disease secondary prevention -Continuewith Crestor, BB as patient tolerates  Paroxysmal atrial fibrillation NSVT -CHA2DS2-Vasc =4 (Age, CHF, PVD, DM). -continue anticoagulation with Eliquis -Continue withBB as tolerates   OHS/OSA/MO --Historically noncompliant with CPAP at home per report, but has been compliant while inpatient --Continue to encourage compliance with CPAP nocturnally  Chronic pain -Chief complaint seems to be primarily pain medications -NCCSR reviewed and med rec reviewed. Pt on 147mpercocet q6hrs prior to admit.  -Cont on 1063mercocet dosing, seems to be tolerating currently.  Constipation -Patient reports good results with lactulose -Continue with cathartics as needed -Stable currently   DVT prophylaxis: SCD's Code Status: Full Family Communication: Pt in room, family not at bedside Disposition Plan: Remains uncertain at this time, CIR currently evaluating  Consultants:   Cardiology  CIR  GI  Procedures:     Antimicrobials: Anti-infectives (From admission, onward)   Start     Dose/Rate Route Frequency Ordered Stop   03/11/19 1000  doxycycline (VIBRA-TABS) tablet 100 mg  Status:  Discontinued     100 mg Oral Every 12 hours 03/11/19 0733 03/18/19 0646   03/06/19 1945  ceFEPIme (MAXIPIME) 2 g in sodium chloride 0.9 % 100 mL IVPB     2 g 200 mL/hr over 30 Minutes Intravenous  Once 03/06/19 1941 03/06/19 2046   03/06/19 1945  vancomycin (VANCOCIN) 2,000 mg in sodium chloride 0.9 % 500 mL IVPB     2,000 mg 250 mL/hr over 120 Minutes Intravenous  Once 03/06/19 1941 03/07/19 0015      Subjective: Very eager to go home  Objective: Vitals:   03/27/19 0337 03/27/19 0726 03/27/19 0739 03/27/19 1129  BP: 92/63  (!) 90/45 116/66  Pulse: 84   67  Resp: (!) 23     Temp: 98 F (36.7 C)  98 F (36.7 C) 98.5 F (36.9 C)  TempSrc: Oral  Oral Oral  SpO2:  98%  100%  Weight: 121.2  kg     Height:        Intake/Output Summary (Last 24 hours) at 03/27/2019 1245 Last data filed at 03/27/2019 1244 Gross per 24 hour  Intake 1402 ml  Output 3050 ml  Net -1648 ml   Filed Weights   03/25/19 0315 03/26/19 0355 03/27/19 0337  Weight: 121.6 kg 122.4 kg 121.2 kg     Examination: General exam: Conversant, in no acute distress Respiratory system: normal chest rise, clear, no audible wheezing Cardiovascular system: regular rhythm, s1-s2 Gastrointestinal system: Nondistended, nontender, pos BS Central nervous system: No seizures, no tremors Extremities: No cyanosis, no joint deformities Skin: No rashes, no pallor Psychiatry: Affect normal // no auditory hallucinations   Data Reviewed: I have personally reviewed following labs and imaging studies  CBC: Recent Labs  Lab 03/22/19 0418 03/23/19 0454 03/24/19 0444 03/26/19 0450 03/27/19 0415  WBC 10.8* 9.5 8.5 6.3 6.5  NEUTROABS  --   --   --  4.5 4.9  HGB 8.4* 8.1* 8.7* 8.5* 9.0*  HCT 29.7* 27.7* 29.6* 28.5* 30.6*  MCV 89.2 88.5 87.6 86.9 86.9  PLT 372 364 390 406* 976*   Basic Metabolic Panel: Recent Labs  Lab 03/21/19 0500 03/22/19 0418 03/23/19 0454 03/24/19 0444 03/25/19 0500 03/26/19 0450 03/27/19 0415 03/27/19 0514  NA 137 139 139 139 141 139 139  --   K 4.8 3.8 4.3 3.4* 3.3* 3.0* 3.1*  --   CL 89* 91* 92* 88* 90* 88* 86*  --   CO2 36* 37* 35* 37* 37* 39* 39*  --   GLUCOSE 158* 49* 54* 143* 189* 183* 185*  --   BUN 92* 93* 88* 94* 87* 88* 84*  --   CREATININE 2.61* 2.39* 2.12* 2.23* 1.88* 1.90* 1.78*  --   CALCIUM 8.6* 8.7* 8.6* 8.9 8.4* 8.6* 8.7*  --   MG 2.4 2.4 2.4  --   --   --   --  2.5*   GFR: Estimated Creatinine Clearance: 51.9 mL/min (A) (by C-G formula based on SCr of 1.78 mg/dL (H)). Liver Function Tests: No results for input(s): AST, ALT, ALKPHOS, BILITOT, PROT, ALBUMIN in the last 168 hours. No results for input(s): LIPASE, AMYLASE in the last 168 hours. No results for input(s): AMMONIA in the last 168 hours. Coagulation Profile: No results for input(s): INR, PROTIME in the last 168 hours. Cardiac Enzymes: No results for input(s): CKTOTAL, CKMB, CKMBINDEX, TROPONINI in the last 168 hours. BNP (last 3 results) No results for input(s): PROBNP in the last 8760  hours. HbA1C: No results for input(s): HGBA1C in the last 72 hours. CBG: Recent Labs  Lab 03/26/19 1624 03/26/19 2110 03/27/19 0651 03/27/19 0745 03/27/19 1123  GLUCAP 44* 327* 102* 126* 142*   Lipid Profile: No results for input(s): CHOL, HDL, LDLCALC, TRIG, CHOLHDL, LDLDIRECT in the last 72 hours. Thyroid Function Tests: No results for input(s): TSH, T4TOTAL, FREET4, T3FREE, THYROIDAB in the last 72 hours. Anemia Panel: No results for input(s): VITAMINB12, FOLATE, FERRITIN, TIBC, IRON, RETICCTPCT in the last 72 hours. Sepsis Labs: No results for input(s): PROCALCITON, LATICACIDVEN in the last 168 hours.  No results found for this or any previous visit (from the past 240 hour(s)).   Radiology Studies: Dg Chest Port 1 View  Result Date: 03/25/2019 CLINICAL DATA:  Cough EXAM: PORTABLE CHEST 1 VIEW COMPARISON:  03/08/2019 FINDINGS: The right-sided PICC line tip terminates over the proximal SVC. The cardiac silhouette is enlarged. Diffuse hazy bilateral airspace opacities are again noted.  There is a dense retrocardiac opacity. Bilateral pleural effusions are noted. There is no pneumothorax. IMPRESSION: 1. Cardiomegaly with findings suspicious for underlying pulmonary edema. An atypical infectious process can have a similar appearance. 2. Retrocardiac opacity favored to represent atelectasis with infiltrate not excluded. 3. Small bilateral pleural effusions. 4. Right-sided PICC line tip terminates over the SVC. Electronically Signed   By: Constance Holster M.D.   On: 03/25/2019 18:33    Scheduled Meds: . apixaban  5 mg Oral BID  . arformoterol  15 mcg Nebulization BID  . bisoprolol  2.5 mg Oral Daily  . budesonide (PULMICORT) nebulizer solution  0.5 mg Nebulization BID  . ferrous sulfate  325 mg Oral BID WC  . folic acid  1 mg Oral Daily  . gabapentin  600 mg Oral BID  . Gerhardt's butt cream   Topical Daily  . guaiFENesin  400 mg Oral BID  . hydrocortisone  25 mg Rectal BID  .  insulin aspart  0-15 Units Subcutaneous TID WC  . insulin aspart  0-5 Units Subcutaneous QHS  . insulin aspart  8 Units Subcutaneous TID WC  . insulin NPH Human  20 Units Subcutaneous BID AC & HS  . ipratropium-albuterol  3 mL Nebulization TID  . isosorbide-hydrALAZINE  0.5 tablet Oral TID  . latanoprost  1 drop Both Eyes QHS  . liver oil-zinc oxide   Topical BID  . metolazone  5 mg Oral BID  . pantoprazole  40 mg Oral BID  . polyethylene glycol  17 g Oral Daily  . potassium chloride  40 mEq Oral Q3H  . potassium chloride  40 mEq Oral Once  . rosuvastatin  20 mg Oral q1800  . spironolactone  25 mg Oral Daily   Continuous Infusions: . furosemide (LASIX) infusion 20 mg/hr (03/27/19 1200)     LOS: 21 days   Marylu Lund, MD [facility] Pager On Amion  If 7PM-7AM, please contact night-coverage 03/27/2019, 12:45 PM

## 2019-03-27 NOTE — Progress Notes (Signed)
Patient ID: Nicholas Caldwell, male   DOB: [birth year], 73 y.o.   MRN: [id number] Patient ID: Nicholas Caldwell, male   DOB: 12/24/1945, 73 y.o.   MRN: [id number]     Advanced Heart Failure Rounding Note  PCP-Cardiologist: No primary care provider on file.   Subjective:    Now off milrinone, co-ox has been ok.  He is on Lasix gtt at 15 m/hr and got metolazone 5 mg x 2 yesterday.  I/Os negative yesterday, weight down 2 lbs. CVP 17 on my check this morning.  BUN/creatinine 88/2.12 => 94/2.23 => 87/1.88 => 88/1.9 => 84/1.78.   Not short of breath at rest, main complaint is still back pain.    He is in NSR this morning.   Lots of packaged snack food on his table today, apparently sent in by his wife yesterday.   MRI head with small chronic infarctions in cerebellum and small acute/early subacute infarctions left posterior frontal lobe.    Objective:   Weight Range: 121.2 kg Body mass index is 36.24 kg/m.   Vital Signs:   Temp:  [97.6 F (36.4 C)-98.6 F (37 C)] 98 F (36.7 C) (05/24 0739) Pulse Rate:  [78-84] 84 (05/24 0337) Resp:  [20-23] 23 (05/24 0337) BP: (90-101)/(43-76) 90/45 (05/24 0739) SpO2:  [93 %-98 %] 98 % (05/24 0726) FiO2 (%):  [28 %] 28 % (05/24 0739) Weight:  [121.2 kg] 121.2 kg (05/24 0337) Last BM Date: 03/26/19  Weight change: Filed Weights   03/25/19 0315 03/26/19 0355 03/27/19 0337  Weight: 121.6 kg 122.4 kg 121.2 kg    Intake/Output:   Intake/Output Summary (Last 24 hours) at 03/27/2019 0919 Last data filed at 03/27/2019 0739 Gross per 24 hour  Intake 1042 ml  Output 3000 ml  Net -1958 ml      Physical Exam    General: NAD Neck: JVP 14 cm, no thyromegaly or thyroid nodule.  Lungs: Decreased BS at bases.  CV: Nondisplaced PMI.  Heart regular S1/S2, no S3/S4, no murmur.  1+ edema to knees.   Abdomen: Soft, nontender, no hepatosplenomegaly, no distention.  Skin: Intact without lesions or rashes.  Neurologic: Alert and oriented x 3.  Psych: Normal  affect. Extremities: No clubbing or cyanosis.  HEENT: Normal.    Telemetry   NSR 70s (personally reviewed).   Labs    CBC Recent Labs    03/26/19 0450 03/27/19 0415  WBC 6.3 6.5  NEUTROABS 4.5 4.9  HGB 8.5* 9.0*  HCT 28.5* 30.6*  MCV 86.9 86.9  PLT 406* 366*   Basic Metabolic Panel Recent Labs    03/26/19 0450 03/27/19 0415  NA 139 139  K 3.0* 3.1*  CL 88* 86*  CO2 39* 39*  GLUCOSE 183* 185*  BUN 88* 84*  CREATININE 1.90* 1.78*  CALCIUM 8.6* 8.7*   Liver Function Tests No results for input(s): AST, ALT, ALKPHOS, BILITOT, PROT, ALBUMIN in the last 72 hours. No results for input(s): LIPASE, AMYLASE in the last 72 hours. Cardiac Enzymes No results for input(s): CKTOTAL, CKMB, CKMBINDEX, TROPONINI in the last 72 hours.  BNP: BNP (last 3 results) Recent Labs    12/01/18 0015 12/30/18 1621 03/06/19 1829  BNP 1,666.7* 1,273.0* 1,020.1*    ProBNP (last 3 results) No results for input(s): PROBNP in the last 8760 hours.   D-Dimer No results for input(s): DDIMER in the last 72 hours. Hemoglobin A1C No results for input(s): HGBA1C in the last 72 hours. Fasting Lipid Panel No results for input(s): CHOL, HDL,  LDLCALC, TRIG, CHOLHDL, LDLDIRECT in the last 72 hours. Thyroid Function Tests No results for input(s): TSH, T4TOTAL, T3FREE, THYROIDAB in the last 72 hours.  Invalid input(s): FREET3  Other results:   Imaging    No results found.   Medications:     Scheduled Medications: . apixaban  5 mg Oral BID  . arformoterol  15 mcg Nebulization BID  . bisoprolol  2.5 mg Oral Daily  . budesonide (PULMICORT) nebulizer solution  0.5 mg Nebulization BID  . ferrous sulfate  325 mg Oral BID WC  . folic acid  1 mg Oral Daily  . gabapentin  600 mg Oral BID  . Gerhardt's butt cream   Topical Daily  . guaiFENesin  400 mg Oral BID  . hydrocortisone  25 mg Rectal BID  . insulin aspart  0-15 Units Subcutaneous TID WC  . insulin aspart  0-5 Units Subcutaneous  QHS  . insulin aspart  8 Units Subcutaneous TID WC  . insulin NPH Human  20 Units Subcutaneous BID AC & HS  . ipratropium-albuterol  3 mL Nebulization TID  . isosorbide-hydrALAZINE  0.5 tablet Oral TID  . latanoprost  1 drop Both Eyes QHS  . liver oil-zinc oxide   Topical BID  . metolazone  5 mg Oral BID  . pantoprazole  40 mg Oral BID  . polyethylene glycol  17 g Oral Daily  . potassium chloride  40 mEq Oral Q3H  . rosuvastatin  20 mg Oral q1800  . spironolactone  25 mg Oral Daily    Infusions: . furosemide (LASIX) infusion 15 mg/hr (03/26/19 0700)    PRN Medications: acetaminophen, acetylcysteine, alum & mag hydroxide-simeth, diazepam, fluticasone, hydrocortisone cream, lidocaine-prilocaine, ondansetron (ZOFRAN) IV, oxyCODONE-acetaminophen, sodium chloride flush   Assessment/Plan   1. Acute on chronic systolic CHF: Nonischemic cardiomyopathy.  With prominent RV failure, likely related to COPD/OSA/OHS. TEE in 3/20 with EF 40-45%, severely dilated RV with moderately decreased systolic function. He has had multiple recent admissions with CHF and COPD exacerbations.  He has not cooperated with paramedicine or [facility] home health as outpatient, has not been using CPAP per his wife, suspect significant medication noncompliance as well though he denies.  Milrinone started 5/9 for RV support. Now off milrinone with adequate co-ox. He is back on Lasix gtt at 15 mg/hr and got metolazone yesterday.  He is still very volume overloaded, CVP 17 on my measure today but diuresed yesterday with negative I/Os, decreased weight, and stable renal indices.  With RV failure, suspect we will not get CVP to normal range but still significantly volume overloaded.   - He can stay off milrinone.   - Still quite volume overloaded and renal function stable, increase Lasix gtt to 20 mg/hr and give him metolazone 5 mg bid again today.  Aggressive K replacement.  - Continue UNNA boots - Continue bisoprolol 2.5 daily.   - Decreased Bidil to 0.5 tab tid with occasional soft BP.  - Continue spironolactone 25 mg daily.    - Fluid restrict.  - Discussion today about limiting sodium intake.  Eating packaged snack food is not helping our cause.  - Cardiomems may be helpful as outpatient.  - I think his long-term prognosis, unfortunately, will be poor with significant RV dysfunction and history of poor compliance.  2. Anemia: Baseline hgb around 8.  6.7 at admission, denies overt GI bleeding.  He has had 2 unit PRBCs and IV Fe.  Hgb 7.4 today, lower but no overt bleeding.  Colonoscopy  5/8 unremarkable but EGD showed necrotic mucosa gastric fundus/body that is concerning for gastric cancer, biopsies taken which were negative for malignancy. Hgb 9.  - Continue Eliquis - Has received IV iron per primary team and GI 3. Atrial fibrillation/flutter: Patient has history of paroxysmal atrial fibrillation.  This admission, he appears to be in an atypical atrial flutter.  Rate is currently controlled. Xarelto initially held with GI bleeding.  Was seen in past by Dr. Rayann Heman, not thought to be good ablation candidate.  He has not been on Tikosyn or amiodarone due to history of long QT. He is in NSR today.  - May eventually ask EP to reconsider ablation given lack of good anti-arrhythmic options. He seems to do better in NSR.  - Continue Eliquis.  4. COPD: Severe, on 3L home oxygen.  He was wheezing initially but now much improved.  This may be due to pulmonary edema, but also consider component of COPD exacerbation.  - Per primary team  5. OHS/OSA: On home oxygen.  Per wife, has not been using CPAP at home.  Will give CPAP at night here.  6. ID: Suspect this is primarily a CHF exacerbation.  Afebrile and WBCs not significantly elevated.  1 blood culture positive for what appears to be coagulase negative Staph (probably contaminant).  COVID-19 negative.  7. CAD: Nonobstructive on prior cath.  No s/s ischemia. He is on Crestor. Off  ASA with DOAC and GI bleed  8. Acute on chronic kidney failure III: Baseline seems to be 1.3-1.6.  Creatinine better today at 1.78 but still above baseline.   9. DM2 with poor control - TRH managing  10. CVA: MRI head showed both small acute/subacute and old CVAs.  Neuro has seen, suspect related to atrial fibrillation.  - He has started on Eliquis (was on Xarelto prior).   Length of Stay: 61  Loralie Champagne, MD  03/27/2019, 9:19 AM  Advanced Heart Failure Team Pager [phone] (M-F; 7a - 4p)  Please contact [facility] for night-coverage after hours (4p -7a ) and weekends on [email or web address]

## 2019-03-27 NOTE — Progress Notes (Signed)
Pt Temp 100.1 @ 15:00 , 100.3 @ 17:00. Dr. Wyline Copas notified. Pt refused OOB today, requested laxative and said he felt unwell. Will monitor Temp hourly.

## 2019-03-27 NOTE — Progress Notes (Signed)
Patient temp 102.2 oral. 2 tabs Oxycodone-acetaminophen 5-325 mg given as patient was complaining of 9/10 pain as well. Will continue to monitor Temp. Hourly.

## 2019-03-28 LAB — BASIC METABOLIC PANEL
Anion gap: 10 (ref 5–15)
BUN: 87 mg/dL — ABNORMAL HIGH (ref 8–23)
CO2: 42 mmol/L — ABNORMAL HIGH (ref 22–32)
Calcium: 8.5 mg/dL — ABNORMAL LOW (ref 8.9–10.3)
Chloride: 84 mmol/L — ABNORMAL LOW (ref 98–111)
Creatinine, Ser: 2.15 mg/dL — ABNORMAL HIGH (ref 0.61–1.24)
GFR calc Af Amer: 35 mL/min — ABNORMAL LOW (ref >60)
GFR calc non Af Amer: 30 mL/min — ABNORMAL LOW (ref >60)
Glucose, Bld: 279 mg/dL — ABNORMAL HIGH (ref 70–99)
Potassium: 3.4 mmol/L — ABNORMAL LOW (ref 3.5–5.1)
Sodium: 136 mmol/L (ref 135–145)

## 2019-03-28 LAB — CBC WITH DIFFERENTIAL/PLATELET
Abs Immature Granulocytes: 0.04 10*3/uL (ref 0.00–0.07)
Basophils Absolute: 0 10*3/uL (ref 0.0–0.1)
Basophils Relative: 0 %
Eosinophils Absolute: 0.1 10*3/uL (ref 0.0–0.5)
Eosinophils Relative: 1 %
HCT: 29.9 % — ABNORMAL LOW (ref 39.0–52.0)
Hemoglobin: 8.9 g/dL — ABNORMAL LOW (ref 13.0–17.0)
Immature Granulocytes: 0 %
Lymphocytes Relative: 9 %
Lymphs Abs: 0.9 10*3/uL (ref 0.7–4.0)
MCH: 25.9 pg — ABNORMAL LOW (ref 26.0–34.0)
MCHC: 29.8 g/dL — ABNORMAL LOW (ref 30.0–36.0)
MCV: 86.9 fL (ref 80.0–100.0)
Monocytes Absolute: 1.1 10*3/uL — ABNORMAL HIGH (ref 0.1–1.0)
Monocytes Relative: 10 %
Neutro Abs: 8.5 10*3/uL — ABNORMAL HIGH (ref 1.7–7.7)
Neutrophils Relative %: 80 %
Platelets: 372 10*3/uL (ref 150–400)
RBC: 3.44 MIL/uL — ABNORMAL LOW (ref 4.22–5.81)
RDW: 23.9 % — ABNORMAL HIGH (ref 11.5–15.5)
WBC: 10.6 10*3/uL — ABNORMAL HIGH (ref 4.0–10.5)
nRBC: 0 % (ref 0.0–0.2)

## 2019-03-28 LAB — GLUCOSE, CAPILLARY
Glucose-Capillary: 143 mg/dL — ABNORMAL HIGH (ref 70–99)
Glucose-Capillary: 151 mg/dL — ABNORMAL HIGH (ref 70–99)
Glucose-Capillary: 135 mg/dL — ABNORMAL HIGH (ref 70–99)
Glucose-Capillary: 150 mg/dL — ABNORMAL HIGH (ref 70–99)
Glucose-Capillary: 115 mg/dL — ABNORMAL HIGH (ref 70–99)
Glucose-Capillary: 162 mg/dL — ABNORMAL HIGH (ref 70–99)

## 2019-03-28 LAB — URINALYSIS, ROUTINE W REFLEX MICROSCOPIC
Bilirubin Urine: NEGATIVE
Glucose, UA: NEGATIVE mg/dL
Hgb urine dipstick: NEGATIVE
Ketones, ur: NEGATIVE mg/dL
Nitrite: POSITIVE — AB
Protein, ur: NEGATIVE mg/dL
Specific Gravity, Urine: 1.009 (ref 1.005–1.030)
pH: 7 (ref 5.0–8.0)

## 2019-03-28 LAB — STREP PNEUMONIAE URINARY ANTIGEN: Strep Pneumo Urinary Antigen: NEGATIVE

## 2019-03-28 LAB — MAGNESIUM: Magnesium: 2.2 mg/dL (ref 1.7–2.4)

## 2019-03-28 MED ORDER — SODIUM CHLORIDE 0.9 % IV SOLN
2.0000 g | Freq: Two times a day (BID) | INTRAVENOUS | Status: DC
  Administered 2019-03-28 – 2019-03-30 (×5): 2 g via INTRAVENOUS
  Filled 2019-03-28 (×6): qty 2

## 2019-03-28 MED ORDER — SODIUM CHLORIDE 0.9 % IV SOLN
1.0000 g | Freq: Three times a day (TID) | INTRAVENOUS | Status: DC
  Filled 2019-03-28 (×3): qty 1

## 2019-03-28 MED ORDER — VANCOMYCIN HCL 10 G IV SOLR
2500.0000 mg | Freq: Once | INTRAVENOUS | Status: AC
  Administered 2019-03-28: 2500 mg via INTRAVENOUS
  Filled 2019-03-28: qty 2500

## 2019-03-28 MED ORDER — VANCOMYCIN HCL IN DEXTROSE 1-5 GM/200ML-% IV SOLN
1000.0000 mg | INTRAVENOUS | Status: DC
  Filled 2019-03-28: qty 200

## 2019-03-28 MED ORDER — POTASSIUM CHLORIDE CRYS ER 20 MEQ PO TBCR
40.0000 meq | EXTENDED_RELEASE_TABLET | Freq: Once | ORAL | Status: AC
  Administered 2019-03-28: 40 meq via ORAL
  Filled 2019-03-28: qty 2

## 2019-03-28 MED ORDER — SODIUM CHLORIDE 0.9 % IV SOLN
2.0000 g | Freq: Two times a day (BID) | INTRAVENOUS | Status: DC
  Filled 2019-03-28: qty 2

## 2019-03-28 MED ORDER — POTASSIUM CHLORIDE CRYS ER 20 MEQ PO TBCR
60.0000 meq | EXTENDED_RELEASE_TABLET | Freq: Once | ORAL | Status: AC
  Administered 2019-03-28: 60 meq via ORAL
  Filled 2019-03-28: qty 3

## 2019-03-28 NOTE — Progress Notes (Signed)
Occupational Therapy Treatment Patient Details Name: Nicholas Caldwell MRN: [id number] DOB: 1946/03/12 Today's Date: 03/28/2019    History of present illness Pt is a 73 y.o. male admitted 03/06/19 with acute on chronic heart failure and respiratory failure. Found to have severe iron deficiency anemia; now s/p small bowel enteroscopy and biopsy 5/12. Plan for DCCV 5/15. PMH includes CHF, chronic anemia, HLD, obesity, CAD, NICM, polyneuropathy, DM, COPD.   OT comments  Pt awakened at beginning of session and agreeable to OT session. Pt progressing towards OT goals. Pt able to perform bed mobility at min A, transfers at min guard with vc for safe hand placement. Pt required assist for perianal care in standing. Set up for grooming tasks as Pt is dependent on BUE in standing. ACtivity tolerance improving, strength improving, and Pt remains motivated. OT will continue to follow acutely.    Follow Up Recommendations  CIR;Supervision/Assistance - 24 hour    Equipment Recommendations  Other (comment)(defer to next venue of care)    Recommendations for Other Services      Precautions / Restrictions Precautions Precautions: Fall       Mobility Bed Mobility Overal bed mobility: Needs Assistance Bed Mobility: Sit to Supine       Sit to supine: Min assist;HOB elevated   General bed mobility comments: min A for trunk elevation, Pt reaches out for therapist  Transfers Overall transfer level: Needs assistance Equipment used: Rolling walker (2 wheeled) Transfers: Sit to/from Stand Sit to Stand: Min guard Stand pivot transfers: Min assist       General transfer comment: cues for hand placement only    Balance Overall balance assessment: Needs assistance   Sitting balance-Leahy Scale: Fair       Standing balance-Leahy Scale: Poor(improving toward fair) Standing balance comment: reliant on the RW for both back weakness/pain and stability, but starting to take hands off to reach for  targets                           ADL either performed or assessed with clinical judgement   ADL Overall ADL's : Needs assistance/impaired     Grooming: Set up;Supervision/safety;Wash/dry hands;Wash/dry face Grooming Details (indicate cue type and reason): EOB                 Toilet Transfer: Min guard;Ambulation;BSC;RW;Requires wide/bariatric Toilet Transfer Details (indicate cue type and reason): short ambulation in room to bari Clint and Hygiene: Maximal assistance;Sit to/from stand Toileting - Clothing Manipulation Details (indicate cue type and reason): Pt able to stand min guard, OT did peri care with wet wipes     Functional mobility during ADLs: Min guard;Rolling walker General ADL Comments: decreased activity tolerance continues, but Pt motivated to continue to progress     Vision       Perception     Praxis      Cognition Arousal/Alertness: Awake/alert Behavior During Therapy: WFL for tasks assessed/performed Overall Cognitive Status: Within Functional Limits for tasks assessed                                          Exercises     Shoulder Instructions       General Comments After transfer to BCS, O2 was at 85, Increased from 3 to 4L    Pertinent Vitals/ Pain  Pain Assessment: Faces Faces Pain Scale: No hurt Pain Intervention(s): Monitored during session  Home Living                                          Prior Functioning/Environment              Frequency  Min 2X/week        Progress Toward Goals  OT Goals(current goals can now be found in the care plan section)  Progress towards OT goals: Progressing toward goals  Acute Rehab OT Goals Patient Stated Goal: to go home OT Goal Formulation: With patient Time For Goal Achievement: 04/05/19  Plan Discharge plan remains appropriate;Frequency remains appropriate    Co-evaluation                  AM-PAC OT "6 Clicks" Daily Activity     Outcome Measure   Help from another person eating meals?: A Little Help from another person taking care of personal grooming?: A Little Help from another person toileting, which includes using toliet, bedpan, or urinal?: A Lot Help from another person bathing (including washing, rinsing, drying)?: A Lot Help from another person to put on and taking off regular upper body clothing?: A Lot Help from another person to put on and taking off regular lower body clothing?: Total 6 Click Score: 13    End of Session Equipment Utilized During Treatment: Gait belt;Rolling walker;Oxygen  OT Visit Diagnosis: Unsteadiness on feet (R26.81);Other abnormalities of gait and mobility (R26.89);Muscle weakness (generalized) (M62.81)   Activity Tolerance Patient tolerated treatment well   Patient Left Other (comment)(walking in hallway with PT)   Nurse Communication          Time: 3143-8887 OT Time Calculation (min): 19 min  Charges: OT General Charges $OT Visit: 1 Visit OT Treatments $Self Care/Home Management : 8-22 mins  Hulda Humphrey OTR/L Acute Rehabilitation Services Pager: [phone] Office: [place] 03/28/2019, 5:20 PM

## 2019-03-28 NOTE — Progress Notes (Signed)
Patient ID: Nicholas Caldwell, male   DOB: 1946/04/25, 73 y.o.   MRN: [id number] Patient ID: Nicholas Caldwell, male   DOB: [birth year], 73 y.o.   MRN: [id number]     Advanced Heart Failure Rounding Note  PCP-Cardiologist: No primary care provider on file.   Subjective:    Now off milrinone. He is on Lasix gtt at 20 m/hr and got metolazone 5 mg x 2 yesterday.  I/Os negative yesterday, weight down 1 lbs. CVP 17 -. 14 on my check this morning.  BUN/creatinine 88/2.12 => 94/2.23 => 87/1.88 => 88/1.9 => 84/1.78. =>  87/2.15  He says he is anxious to go home. No longer wearing condom cath due to skin breakdown. Incontinent of urine all over bed and himself. Denies SOB, orthopnea or PND.   He is in NSR this morning.   Lots of packaged snack food on his table today, apparently sent in by his wife yesterday.   MRI head with small chronic infarctions in cerebellum and small acute/early subacute infarctions left posterior frontal lobe.    Objective:   Weight Range: 120.7 kg Body mass index is 36.09 kg/m.   Vital Signs:   Temp:  [97 F (36.1 C)-102.2 F (39 C)] 97 F (36.1 C) (05/25 1151) Pulse Rate:  [60-82] 73 (05/25 1117) Resp:  [20-27] 20 (05/25 1117) BP: (98-116)/(49-59) 101/54 (05/25 1151) SpO2:  [91 %-98 %] 92 % (05/25 1151) FiO2 (%):  [28 %] 28 % (05/24 1453) Weight:  [120.7 kg] 120.7 kg (05/25 0414) Last BM Date: 03/26/19  Weight change: Filed Weights   03/26/19 0355 03/27/19 0337 03/28/19 0414  Weight: 122.4 kg 121.2 kg 120.7 kg    Intake/Output:   Intake/Output Summary (Last 24 hours) at 03/28/2019 1234 Last data filed at 03/28/2019 0930 Gross per 24 hour  Intake 776.55 ml  Output 1200 ml  Net -423.45 ml      Physical Exam    General:  Sitting in chair No resp difficulty HEENT: normal Neck: supple.JVP to jawCarotids 2+ bilat; no bruits. No lymphadenopathy or thryomegaly appreciated. Cor: PMI nondisplaced. Regular rate & rhythm. 2/6 TR Lungs: clear Abdomen: obese soft,  nontender, nondistended. No hepatosplenomegaly. No bruits or masses. Good bowel sounds. Extremities: no cyanosis, clubbing, rash,1+ edema Neuro: alert & orientedx3, cranial nerves grossly intact. moves all 4 extremities w/o difficulty. Affect pleasant   Telemetry   NSR 70-80s (personally reviewed).   Labs    CBC Recent Labs    03/27/19 0415 03/28/19 0400  WBC 6.5 10.6*  NEUTROABS 4.9 8.5*  HGB 9.0* 8.9*  HCT 30.6* 29.9*  MCV 86.9 86.9  PLT 413* 937   Basic Metabolic Panel Recent Labs    03/27/19 0415 03/27/19 0514 03/28/19 0400  NA 139  --  136  K 3.1*  --  3.4*  CL 86*  --  84*  CO2 39*  --  42*  GLUCOSE 185*  --  279*  BUN 84*  --  87*  CREATININE 1.78*  --  2.15*  CALCIUM 8.7*  --  8.5*  MG  --  2.5* 2.2   Liver Function Tests No results for input(s): AST, ALT, ALKPHOS, BILITOT, PROT, ALBUMIN in the last 72 hours. No results for input(s): LIPASE, AMYLASE in the last 72 hours. Cardiac Enzymes No results for input(s): CKTOTAL, CKMB, CKMBINDEX, TROPONINI in the last 72 hours.  BNP: BNP (last 3 results) Recent Labs    12/01/18 0015 12/30/18 1621 03/06/19 1829  BNP 1,666.7* 1,273.0* 1,020.1*  ProBNP (last 3 results) No results for input(s): PROBNP in the last 8760 hours.   D-Dimer No results for input(s): DDIMER in the last 72 hours. Hemoglobin A1C No results for input(s): HGBA1C in the last 72 hours. Fasting Lipid Panel No results for input(s): CHOL, HDL, LDLCALC, TRIG, CHOLHDL, LDLDIRECT in the last 72 hours. Thyroid Function Tests No results for input(s): TSH, T4TOTAL, T3FREE, THYROIDAB in the last 72 hours.  Invalid input(s): FREET3  Other results:   Imaging    No results found.   Medications:     Scheduled Medications: . apixaban  5 mg Oral BID  . arformoterol  15 mcg Nebulization BID  . bisoprolol  2.5 mg Oral Daily  . budesonide (PULMICORT) nebulizer solution  0.5 mg Nebulization BID  . ferrous sulfate  325 mg Oral BID WC   . folic acid  1 mg Oral Daily  . gabapentin  600 mg Oral BID  . Gerhardt's butt cream   Topical Daily  . guaiFENesin  400 mg Oral BID  . hydrocortisone  25 mg Rectal BID  . insulin aspart  0-15 Units Subcutaneous TID WC  . insulin aspart  0-5 Units Subcutaneous QHS  . insulin aspart  8 Units Subcutaneous TID WC  . insulin NPH Human  20 Units Subcutaneous BID AC & HS  . ipratropium-albuterol  3 mL Nebulization TID  . isosorbide-hydrALAZINE  0.5 tablet Oral TID  . latanoprost  1 drop Both Eyes QHS  . liver oil-zinc oxide   Topical BID  . pantoprazole  40 mg Oral BID  . polyethylene glycol  17 g Oral Daily  . rosuvastatin  20 mg Oral q1800  . spironolactone  25 mg Oral Daily    Infusions: . ceFEPime (MAXIPIME) IV    . furosemide (LASIX) infusion 20 mg/hr (03/28/19 0344)  . vancomycin 2,500 mg (03/28/19 1047)  . [START ON 03/29/2019] vancomycin      PRN Medications: acetaminophen, acetylcysteine, alum & mag hydroxide-simeth, diazepam, docusate sodium, fluticasone, hydrocortisone cream, lidocaine-prilocaine, ondansetron (ZOFRAN) IV, oxyCODONE-acetaminophen, sodium chloride flush   Assessment/Plan   1. Acute on chronic systolic CHF: Nonischemic cardiomyopathy.  With prominent RV failure, likely related to COPD/OSA/OHS. TEE in 3/20 with EF 40-45%, severely dilated RV with moderately decreased systolic function. He has had multiple recent admissions with CHF and COPD exacerbations.  He has not cooperated with paramedicine or [facility] home health as outpatient, has not been using CPAP per his wife, suspect significant medication noncompliance as well though he denies.  Milrinone started 5/9 for RV support. Now off milrinone with adequate co-ox. He is back on Lasix gtt at 15 mg/hr and got metolazone yesterday.  He is still very volume overloaded, CVP 17 on my measure today but diuresed yesterday with negative I/Os, decreased weight, and stable renal indices.  With RV failure, suspect we will not  get CVP to normal range but still significantly volume overloaded.   - He can stay off milrinone.   - He remains volume overloaded but diuresis limited by cardiorenal syndrome. Without HD, I think this is absolutely as good as we can get. Situation complicated by dietary indiscretion and inability to care for himself. I do not see how we can maintain him outside of the hospital.  - Will continue IV lasix today. Hold metolazone. Watch renal function  - Continue UNNA boots - Continue bisoprolol 2.5 daily.  - Continue Bidil 0.5 tab tid  - Continue spironolactone 25 mg daily.    - Fluid restrict.  -  I think his long-term prognosis, unfortunately, will be poor with significant RV dysfunction and history of poor compliance.  2. Anemia: Baseline hgb around 8.  6.7 at admission, denies overt GI bleeding.  He has had 2 unit PRBCs and IV Fe.  Hgb 7.4 today, lower but no overt bleeding.  Colonoscopy 5/8 unremarkable but EGD showed necrotic mucosa gastric fundus/body that is concerning for gastric cancer, biopsies taken which were negative for malignancy. Hgb 9.  - Continue Eliquis - Has received IV iron per primary team and GI 3. Atrial fibrillation/flutter: Patient has history of paroxysmal atrial fibrillation.  This admission, he appears to be in an atypical atrial flutter.  Rate is currently controlled. Xarelto initially held with GI bleeding.  Was seen in past by Dr. Rayann Heman, not thought to be good ablation candidate.  He has not been on Tikosyn or amiodarone due to history of long QT. He is in NSR today.  - May eventually ask EP to reconsider ablation given lack of good anti-arrhythmic options. He seems to do better in NSR.  - Continue Eliquis.  4. COPD: Severe, on 3L home oxygen.  He was wheezing initially but now much improved.  This may be due to pulmonary edema, but also consider component of COPD exacerbation.  - Per primary team  5. OHS/OSA: On home oxygen.  Per wife, has not been using CPAP at  home.  Will give CPAP at night here.  6. ID: Suspect this is primarily a CHF exacerbation.  Afebrile and WBCs not significantly elevated.  1 blood culture positive for what appears to be coagulase negative Staph (probably contaminant).  COVID-19 negative.  7. CAD: Nonobstructive on prior cath.  No s/s ischemia. He is on Crestor. Off ASA with DOAC and GI bleed  8. Acute on chronic kidney failure III: Baseline seems to be 1.3-1.6.  Creatinine 2.15 today  9. DM2 with poor control - TRH managing  10. CVA: MRI head showed both small acute/subacute and old CVAs.  Neuro has seen, suspect related to atrial fibrillation.  - He has started on Eliquis (was on Xarelto prior).  11. Hypokalemia- supp 12. Penile wound - will replace Foley.   Length of Stay: Prado Verde, MD  03/28/2019, 12:34 PM  Advanced Heart Failure Team Pager [phone] (M-F; [place])  Please contact [facility] for night-coverage after hours (4p -7a ) and weekends on [email or web address]

## 2019-03-28 NOTE — Progress Notes (Signed)
PROGRESS NOTE    Nicholas Caldwell  UKG:[id number] DOB: [birth year] DOA: 03/06/2019 PCP: [facility]    Brief Narrative:  73 y.o.Mwith CAD, isch CM EF 40%, sdCHF, pAF on Xarelto, pHTN, MO, OSA not compliant with CPAP, COPD on home O2 3L, and smoking who presented with swelling and dyspnea.  Patient had had slow progressive dyspnea, SOB with exertion, also significant peripheral edema. In ER, BNP >1000, CXR with edema, HR 124 in Afib, and new anemia Hgb 6.7 g/dL. Transfused and started on Lasix.  Assessment & Plan:   Principal Problem:   Acute on chronic respiratory failure with hypoxia and hypercapnia (HCC) Active Problems:   Essential hypertension   Type 2 diabetes mellitus with neuropathy   PAF (paroxysmal atrial fibrillation) (HCC)   COPD (chronic obstructive pulmonary disease) (HCC)   Medically noncompliant   Atrial fibrillation with RVR (HCC)   AKI (acute kidney injury) ([place])   Acute on chronic anemia   Acute on chronic systolic (congestive) heart failure (HCC)   Acute on chronic systolic CHF (congestive heart failure) (HCC)   Iron deficiency anemia   NSVT (nonsustained ventricular tachycardia) (HCC)   Tobacco abuse counseling   Gastritis and gastroduodenitis   Cerebral embolism with cerebral infarction   Acute versus subacute ischemic stroke, likely cardioembolic Overnight, patient was apparently confused and nonverbal for a few seconds.  Patient states he was angry at nurses and was just not communicating.  Rapid response was initiated with a CT head notable for small bilateral cerebellar infarcts, question chronic versus acute.  EEG with no seizure activity.  MR brain notable for small group of left frontal acute versus early subacute strokes in the setting of a flutter/atrial fibrillation. --Neurology had been following initially --Lipid panel 5/2 w/ TC 83, LDL 18, HDL 43, TG 109 --HbA1c 6.07 Nov 2018 --Bilateral carotid Dopplers with no significant findings  --continue crestor as tolerated --Continue anticoagulation with Eliquis. No sign of acute bleeding --continue to monitor on telemetry --Continued on  home gabapentin as tolerated - Remains stable  Acute on chronic hypoxic respiratory failure: resolved Patient presenting from home with progressive dyspnea associated with worsening lower extremity edema.  Likely multifactorial from acute CHF exacerbation and COPD flare, as well as anemia, NSVT/AF.  Chest x-ray on admission notable for bilateral interstitial and alveolar airspace opacities with cardiomegaly. --Cont to wean O2 as tolerated --continues with breathing treatments as tolerated. O2 weaned to [facility] with appropriate O2 sats  Acute on chronic systolic and diastolic CHF EF 76-28% on TEE in Mar.  --Cardiology/HF team following, appreciate assistance --Transitioned metolazone, Acetazolamide and Lasix gtt to torsemide 63m PO BID on 5/18 by cards due to increase in creatinine --Continued on lasix gtt per Cardiology today --ContinueCrestor --ContinueBB as tolerated --Patient noted to have multiple snack items at bedside brought by wife recently with foods high in salt content. Counseling done at bedside  Atrial fibrillation/atypical aflutter --Continues with persistent A. fib/flutter on telemetry.  Cardiology following, appreciate assistance.  Has been discussed with EP, not a optimal ablation candidate, may consider in the future. --Initially started on heparin drip on 03/15/2019; transitioned to Eliquis 5/14 --Hgb stable, 8.9  --continue beta-blocker --Continue to monitor on telemetry - Currently rate controlled  Acute kidney injury Creatinine baseline 1.2 - 1.6 for the last 6 months roughly, trending up from 0.9 last few years. AKI again this hospitalization, has been increasing last few days with diuresis. -Cr stable at 2.15 -Recheck bmet in AM  Iron deficiency anemia Acute  blood loss anemia Upper GI bleed Suspected  chronic GI blood lossfrom gastric ulcer.  Received 2 units PRBCs during the hospitalization. EGD 5/12 with multiple nonbleeding erosions, one nonbleeding superficial gastric ulcer with clean ulcer base --Pathology from stomach biopsy negative for metaplasia, dysplasia or malignancy, no H. pylori --s/p Feraheme 5/4, 5/12 --Continue protonix 77m BID x 12 weeks --Minimize NSAID use --Continue to monitor daily CBC, especially now on Eliquis --GI recommends outpatient follow-up and consideration of gastric mapping in the future as an outpatient; will need follow-up with Dr. CBryan Lemma --Without acute bleeding  Acute COPD exacerbation Patient with home O2 dependence at 3 L per nasal cannula. --completed course of steroids and doxycycline --Pulmicort/Brovana --Continue nebs prn --Chest PT, Mucinex, Mucomyst prn --Continue to titrate supplemental oxygen to maintain SPO2 greater than 88% - O2 has weaned to [facility] cont O2 as needed  Positive blood culture --CoNS in 1/2. Likely contaminant.  Hypokalemia -K 3.4 today,  --Potassium replacement ordered --repeat bmet in AM  Diabetes Hemoglobin A1c 6.5 on 11/13/2018.  Home regimen includes metformin 1000 mg p.o. twice daily, NPH 70-30 5u BID.  Glucose poorly controlled during hospitalization, likely exacerbated by need of IV steroids for COPD exacerbation as above.  Also with likely dietary noncompliance. --NPH continued on 20u SQ BID --Novolog continued on 8u TIDAC --Cont on SSI coverage --Glucose trends reviewed, stable  Hypertension Coronary disease secondary prevention -Continuewith Crestor, BB as patient tolerates  Paroxysmal atrial fibrillation NSVT -CHA2DS2-Vasc =4 (Age, CHF, PVD, DM). -continue anticoagulation with Eliquis -Continue withBB as tolerates  OHS/OSA/MO --Historically noncompliant with CPAP at home per report, but has been compliant while inpatient --Continue to encourage compliance with CPAP nocturnally   Chronic pain -Chief complaint seems to be primarily pain medications -NCCSR reviewed and med rec reviewed. Pt on 142mpercocet q6hrs prior to admit.  -Cont on 1045mercocet dosing, seems to be tolerating currently.  Constipation -Patient reports good results with lactulose -Continue with cathartics as needed -Stable currently  Sepsis with PNA, not present on admit -Pt with temp of 1.3F with RR in the upper 20's this AM -Recent CXR with findings suggestive of PNA -Will start cefepime and vanc -Have ordered blood culture and UA/urine culture. Follow culture results   DVT prophylaxis: SCD's Code Status: Full Family Communication: Pt in room, family not at bedside Disposition Plan: Remains uncertain at this time, CIR currently evaluating  Consultants:   Cardiology  CIR  GI  Procedures:     Antimicrobials: Anti-infectives (From admission, onward)   Start     Dose/Rate Route Frequency Ordered Stop   03/29/19 1100  vancomycin (VANCOCIN) IVPB 1000 mg/200 mL premix     1,000 mg 200 mL/hr over 60 Minutes Intravenous Every 24 hours 03/28/19 0944     03/28/19 2200  ceFEPIme (MAXIPIME) 2 g in sodium chloride 0.9 % 100 mL IVPB  Status:  Discontinued     2 g 200 mL/hr over 30 Minutes Intravenous Every 12 hours 03/28/19 0944 03/28/19 1114   03/28/19 1115  ceFEPIme (MAXIPIME) 2 g in sodium chloride 0.9 % 100 mL IVPB     2 g 200 mL/hr over 30 Minutes Intravenous Every 12 hours 03/28/19 1114     03/28/19 1030  vancomycin (VANCOCIN) 2,500 mg in sodium chloride 0.9 % 500 mL IVPB     2,500 mg 250 mL/hr over 120 Minutes Intravenous  Once 03/28/19 0944 03/28/19 1247   03/28/19 0930  ceFEPIme (MAXIPIME) 1 g in sodium chloride 0.9 % 100 mL  IVPB  Status:  Discontinued     1 g 200 mL/hr over 30 Minutes Intravenous Every 8 hours 03/28/19 0917 03/28/19 0944   03/11/19 1000  doxycycline (VIBRA-TABS) tablet 100 mg  Status:  Discontinued     100 mg Oral Every 12 hours 03/11/19 0733 03/18/19  0646   03/06/19 1945  ceFEPIme (MAXIPIME) 2 g in sodium chloride 0.9 % 100 mL IVPB     2 g 200 mL/hr over 30 Minutes Intravenous  Once 03/06/19 1941 03/06/19 2046   03/06/19 1945  vancomycin (VANCOCIN) 2,000 mg in sodium chloride 0.9 % 500 mL IVPB     2,000 mg 250 mL/hr over 120 Minutes Intravenous  Once 03/06/19 1941 03/07/19 0015      Subjective: Feels weaker this AM  Objective: Vitals:   03/28/19 0737 03/28/19 0753 03/28/19 1117 03/28/19 1151  BP: (!) 105/51   (!) 101/54  Pulse: 60  73   Resp:   20   Temp: 98.2 F (36.8 C)  98.6 F (37 C) (!) 97 F (36.1 C)  TempSrc: Axillary  Oral Axillary  SpO2: 98% 98% 91% 92%  Weight:      Height:        Intake/Output Summary (Last 24 hours) at 03/28/2019 1312 Last data filed at 03/28/2019 0930 Gross per 24 hour  Intake 434.55 ml  Output 800 ml  Net -365.45 ml   Filed Weights   03/26/19 0355 03/27/19 0337 03/28/19 0414  Weight: 122.4 kg 121.2 kg 120.7 kg    Examination: General exam: Awake, laying in bed, in nad Respiratory system: Normal respiratory effort, no wheezing Cardiovascular system: regular rate, s1, s2 Gastrointestinal system: Soft, nondistended, positive BS Central nervous system: CN2-12 grossly intact, strength intact Extremities: Perfused, no clubbing Skin: Normal skin turgor, no notable skin lesions seen Psychiatry: Mood normal // no visual hallucinations   Data Reviewed: I have personally reviewed following labs and imaging studies  CBC: Recent Labs  Lab 03/23/19 0454 03/24/19 0444 03/26/19 0450 03/27/19 0415 03/28/19 0400  WBC 9.5 8.5 6.3 6.5 10.6*  NEUTROABS  --   --  4.5 4.9 8.5*  HGB 8.1* 8.7* 8.5* 9.0* 8.9*  HCT 27.7* 29.6* 28.5* 30.6* 29.9*  MCV 88.5 87.6 86.9 86.9 86.9  PLT 364 390 406* 413* 409   Basic Metabolic Panel: Recent Labs  Lab 03/22/19 0418 03/23/19 0454 03/24/19 0444 03/25/19 0500 03/26/19 0450 03/27/19 0415 03/27/19 0514 03/28/19 0400  NA 139 139 139 141 139 139  --   136  K 3.8 4.3 3.4* 3.3* 3.0* 3.1*  --  3.4*  CL 91* 92* 88* 90* 88* 86*  --  84*  CO2 37* 35* 37* 37* 39* 39*  --  42*  GLUCOSE 49* 54* 143* 189* 183* 185*  --  279*  BUN 93* 88* 94* 87* 88* 84*  --  87*  CREATININE 2.39* 2.12* 2.23* 1.88* 1.90* 1.78*  --  2.15*  CALCIUM 8.7* 8.6* 8.9 8.4* 8.6* 8.7*  --  8.5*  MG 2.4 2.4  --   --   --   --  2.5* 2.2   GFR: Estimated Creatinine Clearance: 42.9 mL/min (A) (by C-G formula based on SCr of 2.15 mg/dL (H)). Liver Function Tests: No results for input(s): AST, ALT, ALKPHOS, BILITOT, PROT, ALBUMIN in the last 168 hours. No results for input(s): LIPASE, AMYLASE in the last 168 hours. No results for input(s): AMMONIA in the last 168 hours. Coagulation Profile: No results for input(s): INR, PROTIME in  the last 168 hours. Cardiac Enzymes: No results for input(s): CKTOTAL, CKMB, CKMBINDEX, TROPONINI in the last 168 hours. BNP (last 3 results) No results for input(s): PROBNP in the last 8760 hours. HbA1C: No results for input(s): HGBA1C in the last 72 hours. CBG: Recent Labs  Lab 03/27/19 1611 03/27/19 2219 03/28/19 0614 03/28/19 0749 03/28/19 1121  GLUCAP 131* 143* 151* 135* 150*   Lipid Profile: No results for input(s): CHOL, HDL, LDLCALC, TRIG, CHOLHDL, LDLDIRECT in the last 72 hours. Thyroid Function Tests: No results for input(s): TSH, T4TOTAL, FREET4, T3FREE, THYROIDAB in the last 72 hours. Anemia Panel: No results for input(s): VITAMINB12, FOLATE, FERRITIN, TIBC, IRON, RETICCTPCT in the last 72 hours. Sepsis Labs: No results for input(s): PROCALCITON, LATICACIDVEN in the last 168 hours.  No results found for this or any previous visit (from the past 240 hour(s)).   Radiology Studies: No results found.  Scheduled Meds: . apixaban  5 mg Oral BID  . arformoterol  15 mcg Nebulization BID  . bisoprolol  2.5 mg Oral Daily  . budesonide (PULMICORT) nebulizer solution  0.5 mg Nebulization BID  . ferrous sulfate  325 mg Oral BID  WC  . folic acid  1 mg Oral Daily  . gabapentin  600 mg Oral BID  . Gerhardt's butt cream   Topical Daily  . guaiFENesin  400 mg Oral BID  . hydrocortisone  25 mg Rectal BID  . insulin aspart  0-15 Units Subcutaneous TID WC  . insulin aspart  0-5 Units Subcutaneous QHS  . insulin aspart  8 Units Subcutaneous TID WC  . insulin NPH Human  20 Units Subcutaneous BID AC & HS  . ipratropium-albuterol  3 mL Nebulization TID  . isosorbide-hydrALAZINE  0.5 tablet Oral TID  . latanoprost  1 drop Both Eyes QHS  . liver oil-zinc oxide   Topical BID  . pantoprazole  40 mg Oral BID  . polyethylene glycol  17 g Oral Daily  . rosuvastatin  20 mg Oral q1800  . spironolactone  25 mg Oral Daily   Continuous Infusions: . ceFEPime (MAXIPIME) IV    . furosemide (LASIX) infusion 20 mg/hr (03/28/19 0344)  . [START ON 03/29/2019] vancomycin       LOS: 22 days   Marylu Lund, MD [facility] Pager On Amion  If 7PM-7AM, please contact night-coverage 03/28/2019, 1:12 PM

## 2019-03-28 NOTE — Progress Notes (Signed)
Inpatient Rehab Admissions Coordinator:   Continuing to follow.  I have not been able to reach his wife, but will try again today.  Note pt continuing on IV lasix.   Shann Medal, PT, DPT Admissions Coordinator [phone] 03/28/19  11:10 AM

## 2019-03-28 NOTE — Progress Notes (Signed)
Physical Therapy Treatment Patient Details Name: Nicholas Caldwell MRN: [id number] DOB: 1946-09-01 Today's Date: 03/28/2019    History of Present Illness Pt is a 73 y.o. male admitted 03/06/19 with acute on chronic heart failure and respiratory failure. Found to have severe iron deficiency anemia; now s/p small bowel enteroscopy and biopsy 5/12. Plan for DCCV 5/15. PMH includes CHF, chronic anemia, HLD, obesity, CAD, NICM, polyneuropathy, DM, COPD.    PT Comments    Pt starting to show noticeable progression.  Emphasis on gait stability and stamina with monitoring or HR and SpO2.    Follow Up Recommendations  CIR     Equipment Recommendations  None recommended by PT    Recommendations for Other Services       Precautions / Restrictions Precautions Precautions: Fall    Mobility  Bed Mobility               General bed mobility comments: pt OOB on the [facility] with OT  Transfers Overall transfer level: Needs assistance Equipment used: Rolling walker (2 wheeled) Transfers: Sit to/from Stand Sit to Stand: Min guard Stand pivot transfers: Min assist       General transfer comment: cues for hand placement only  Ambulation/Gait Ambulation/Gait assistance: Min guard Gait Distance (Feet): 80 Feet(x2) Assistive device: Rolling walker (2 wheeled) Gait Pattern/deviations: Step-through pattern;Decreased step length - right;Decreased step length - left;Decreased stride length;Trunk flexed Gait velocity: decreased   General Gait Details: slow, stooped with mild unsteadiness, moderate use of the RW.  SpO2 fluctuating.  Generally lower than 90% unless on 6L.  EHR in the 80's and 90's.  Pulse frequently jumped into the 120's and 130's   Stairs             Wheelchair Mobility    Modified Rankin (Stroke Patients Only)       Balance Overall balance assessment: Needs assistance   Sitting balance-Leahy Scale: Fair       Standing balance-Leahy Scale: Poor(improving toward  fair) Standing balance comment: reliant on the RW for both back weakness/pain and stability, but starting to take hands off to reach for targets                            Cognition Arousal/Alertness: Awake/alert Behavior During Therapy: WFL for tasks assessed/performed Overall Cognitive Status: Within Functional Limits for tasks assessed                                        Exercises      General Comments        Pertinent Vitals/Pain Pain Assessment: Faces Faces Pain Scale: No hurt    Home Living                      Prior Function            PT Goals (current goals can now be found in the care plan section) Acute Rehab PT Goals Patient Stated Goal: to go home PT Goal Formulation: With patient Time For Goal Achievement: 04/07/19 Potential to Achieve Goals: Good Progress towards PT goals: Progressing toward goals    Frequency    Min 3X/week      PT Plan Current plan remains appropriate    Co-evaluation              AM-PAC PT "6 Clicks" Mobility  Outcome Measure  Help needed turning from your back to your side while in a flat bed without using bedrails?: A Lot Help needed moving from lying on your back to sitting on the side of a flat bed without using bedrails?: A Lot Help needed moving to and from a bed to a chair (including a wheelchair)?: A Little Help needed standing up from a chair using your arms (e.g., wheelchair or bedside chair)?: A Little Help needed to walk in hospital room?: A Little Help needed climbing 3-5 steps with a railing? : Total 6 Click Score: 14    End of Session Equipment Utilized During Treatment: Oxygen Activity Tolerance: Patient limited by fatigue;Patient tolerated treatment well Patient left: in chair;with call bell/phone within reach Nurse Communication: Mobility status PT Visit Diagnosis: Unsteadiness on feet (R26.81);Muscle weakness (generalized) (M62.81)     Time:  1594-7076 PT Time Calculation (min) (ACUTE ONLY): 17 min  Charges:  $Gait Training: 8-22 mins                     03/28/2019  [name], PT Acute Rehabilitation Services [phone]  (pager) [phone]  (office)   Tessie Fass Ridley Dileo 03/28/2019, 3:35 PM

## 2019-03-28 NOTE — Care Management Important Message (Signed)
Important Message  Patient Details  Name: Nicholas Caldwell MRN: [id number] Date of Birth: [birth year]   Medicare Important Message Given:  Yes    [name] 03/28/2019, 2:52 PM

## 2019-03-28 NOTE — Progress Notes (Signed)
Pharmacy Antibiotic Note  Nicholas Caldwell is a 73 y.o. male admitted with possible PNA. Pharmacy has been consulted for vancomycin dosing. -WBC= 10.6, tmax= 102.2, SCr= 2.15, CrCl ~ 40  Plan: -Change Cefepime to 2gm IV q12h -Vancomycin 2549m IV followed by 10089mIV q24h (AUC= 497; SCr= 2.15) -follow renal function, cultures and clinical progress  Height: 6' (182.9 cm) Weight: 266 lb 1.5 oz (120.7 kg) IBW/kg (Calculated) : 77.6  Temp (24hrs), Avg:99.6 F (37.6 C), Min:98.2 F (36.8 C), Max:102.2 F (39 C)  Recent Labs  Lab 03/23/19 0454 03/24/19 0444 03/25/19 0500 03/26/19 0450 03/27/19 0415 03/28/19 0400  WBC 9.5 8.5  --  6.3 6.5 10.6*  CREATININE 2.12* 2.23* 1.88* 1.90* 1.78* 2.15*    Estimated Creatinine Clearance: 42.9 mL/min (A) (by C-G formula based on SCr of 2.15 mg/dL (H)).    No Known Allergies  Current Antimicrobials: 5/25 vanc 5/25 cefepime  Dose adjustments this admission:   Microbiology results: Last cultures 03/07/19- neg  Thank you for allowing pharmacy to be a part of this patient's care.  AnHildred LaserPharmD Clinical Pharmacist **Pharmacist phone directory can now be found on [facility] [place] (PW [email or web address]).  Listed under [place]

## 2019-03-29 LAB — CBC WITH DIFFERENTIAL/PLATELET
Abs Immature Granulocytes: 0.04 10*3/uL (ref 0.00–0.07)
Basophils Absolute: 0 10*3/uL (ref 0.0–0.1)
Basophils Relative: 0 %
Eosinophils Absolute: 0.2 10*3/uL (ref 0.0–0.5)
Eosinophils Relative: 3 %
HCT: 28.7 % — ABNORMAL LOW (ref 39.0–52.0)
Hemoglobin: 8.4 g/dL — ABNORMAL LOW (ref 13.0–17.0)
Immature Granulocytes: 1 %
Lymphocytes Relative: 9 %
Lymphs Abs: 0.8 10*3/uL (ref 0.7–4.0)
MCH: 25.5 pg — ABNORMAL LOW (ref 26.0–34.0)
MCHC: 29.3 g/dL — ABNORMAL LOW (ref 30.0–36.0)
MCV: 87 fL (ref 80.0–100.0)
Monocytes Absolute: 0.7 10*3/uL (ref 0.1–1.0)
Monocytes Relative: 8 %
Neutro Abs: 7 10*3/uL (ref 1.7–7.7)
Neutrophils Relative %: 79 %
Platelets: 323 10*3/uL (ref 150–400)
RBC: 3.3 MIL/uL — ABNORMAL LOW (ref 4.22–5.81)
RDW: 23.6 % — ABNORMAL HIGH (ref 11.5–15.5)
WBC: 8.8 10*3/uL (ref 4.0–10.5)
nRBC: 0 % (ref 0.0–0.2)

## 2019-03-29 LAB — GLUCOSE, CAPILLARY
Glucose-Capillary: 113 mg/dL — ABNORMAL HIGH (ref 70–99)
Glucose-Capillary: 166 mg/dL — ABNORMAL HIGH (ref 70–99)
Glucose-Capillary: 164 mg/dL — ABNORMAL HIGH (ref 70–99)
Glucose-Capillary: 167 mg/dL — ABNORMAL HIGH (ref 70–99)

## 2019-03-29 LAB — BASIC METABOLIC PANEL
Anion gap: 10 (ref 5–15)
BUN: 91 mg/dL — ABNORMAL HIGH (ref 8–23)
CO2: 38 mmol/L — ABNORMAL HIGH (ref 22–32)
Calcium: 8.3 mg/dL — ABNORMAL LOW (ref 8.9–10.3)
Chloride: 90 mmol/L — ABNORMAL LOW (ref 98–111)
Creatinine, Ser: 2.13 mg/dL — ABNORMAL HIGH (ref 0.61–1.24)
GFR calc Af Amer: 35 mL/min — ABNORMAL LOW (ref >60)
GFR calc non Af Amer: 30 mL/min — ABNORMAL LOW (ref >60)
Glucose, Bld: 165 mg/dL — ABNORMAL HIGH (ref 70–99)
Potassium: 3.1 mmol/L — ABNORMAL LOW (ref 3.5–5.1)
Sodium: 138 mmol/L (ref 135–145)

## 2019-03-29 LAB — MAGNESIUM: Magnesium: 2.3 mg/dL (ref 1.7–2.4)

## 2019-03-29 LAB — EXPECTORATED SPUTUM ASSESSMENT W GRAM STAIN, RFLX TO RESP C

## 2019-03-29 LAB — MRSA PCR SCREENING: MRSA by PCR: NEGATIVE

## 2019-03-29 MED ORDER — POTASSIUM CHLORIDE CRYS ER 20 MEQ PO TBCR
40.0000 meq | EXTENDED_RELEASE_TABLET | Freq: Once | ORAL | Status: AC
  Administered 2019-03-29: 40 meq via ORAL
  Filled 2019-03-29: qty 2

## 2019-03-29 MED ORDER — TORSEMIDE 20 MG PO TABS
100.0000 mg | ORAL_TABLET | Freq: Two times a day (BID) | ORAL | Status: DC
  Administered 2019-03-29 – 2019-03-31 (×5): 100 mg via ORAL
  Filled 2019-03-29 (×5): qty 5

## 2019-03-29 NOTE — Progress Notes (Signed)
Physical Therapy Treatment Patient Details Name: Nicholas Caldwell MRN: [id number] DOB: 1946-03-03 Today's Date: 03/29/2019    History of Present Illness Pt is a 73 y.o. male admitted 03/06/19 with acute on chronic heart failure and respiratory failure. Found to have severe iron deficiency anemia; now s/p small bowel enteroscopy and biopsy 5/12. Plan for DCCV 5/15. PMH includes CHF, chronic anemia, HLD, obesity, CAD, NICM, polyneuropathy, DM, COPD.    PT Comments    Pt agreeable to ambulation in the halls with minimal encouragement.  Pt wanted HOB up, but agreed to try bed mobility with lowered HOB.   Emphasis on transitions, sit to stand and gait stability/stamina.  SpO2 during gait on 4L [place] maintain at or better than 90% overall.    Follow Up Recommendations  Home health PT(pt/pt's wife state they'd like to go directly home.)     Equipment Recommendations  None recommended by PT    Recommendations for Other Services       Precautions / Restrictions Precautions Precautions: Fall    Mobility  Bed Mobility Overal bed mobility: Needs Assistance Bed Mobility: Supine to Sit     Supine to sit: Min assist;HOB elevated        Transfers Overall transfer level: Needs assistance   Transfers: Sit to/from Stand Sit to Stand: Min guard         General transfer comment: cues for hand placement only  Ambulation/Gait Ambulation/Gait assistance: Min guard Gait Distance (Feet): 80 Feet(x2) Assistive device: Rolling walker (2 wheeled) Gait Pattern/deviations: Step-through pattern Gait velocity: decreased   General Gait Details: slow, mildly stooped, episodes of mild unsteadiness.  Sats on 4L [place] never dropped below 90% and often as high as 95%.  EHR in the lower 90's max bpm   Stairs             Wheelchair Mobility    Modified Rankin (Stroke Patients Only)       Balance Overall balance assessment: Needs assistance Sitting-balance support: No upper extremity  supported;Feet supported Sitting balance-Leahy Scale: Fair       Standing balance-Leahy Scale: Poor(improving toward fair) Standing balance comment: reliant on the RW for both back weakness/pain and stability, but starting to take hands off to reach for targets                            Cognition Arousal/Alertness: Awake/alert Behavior During Therapy: WFL for tasks assessed/performed Overall Cognitive Status: Within Functional Limits for tasks assessed                                        Exercises      General Comments        Pertinent Vitals/Pain Pain Assessment: 0-10 Pain Score: 9  Faces Pain Scale: Hurts little more Pain Location: back Pain Descriptors / Indicators: Discomfort;Sore Pain Intervention(s): Monitored during session    Home Living                      Prior Function            PT Goals (current goals can now be found in the care plan section) Acute Rehab PT Goals Patient Stated Goal: to go home PT Goal Formulation: With patient Time For Goal Achievement: 04/07/19 Potential to Achieve Goals: Good Progress towards PT goals: Progressing toward goals  Frequency    Min 3X/week      PT Plan Current plan remains appropriate    Co-evaluation              AM-PAC PT "6 Clicks" Mobility   Outcome Measure  Help needed turning from your back to your side while in a flat bed without using bedrails?: A Lot Help needed moving from lying on your back to sitting on the side of a flat bed without using bedrails?: A Little Help needed moving to and from a bed to a chair (including a wheelchair)?: A Little Help needed standing up from a chair using your arms (e.g., wheelchair or bedside chair)?: A Little Help needed to walk in hospital room?: A Little Help needed climbing 3-5 steps with a railing? : Total 6 Click Score: 15    End of Session Equipment Utilized During Treatment: Oxygen Activity Tolerance:  Patient tolerated treatment well;Patient limited by fatigue Patient left: in chair;with call bell/phone within reach Nurse Communication: Mobility status PT Visit Diagnosis: Unsteadiness on feet (R26.81);Difficulty in walking, not elsewhere classified (R26.2)     Time: 6160-7371 PT Time Calculation (min) (ACUTE ONLY): 30 min  Charges:  $Gait Training: 8-22 mins $Therapeutic Activity: 8-22 mins                     03/29/2019  Nicholas Caldwell, PT Acute Rehabilitation Services [phone]  (pager) [phone]  (office)   Nicholas Caldwell 03/29/2019, 1:14 PM

## 2019-03-29 NOTE — Progress Notes (Signed)
SLP Cancellation Note  Patient Details Name: Nicholas Caldwell MRN: [id number] DOB: [birth year]   Cancelled treatment:       Reason Eval/Treat Not Completed: Patient at procedure or test/unavailable(Pt is currently eating lunch. SLP will re-attempt as able. )  Cassell Voorhies I. Hardin Negus, [place], [place] Office number [phone] Pager [place] 03/29/2019, 11:42 AM

## 2019-03-29 NOTE — Progress Notes (Signed)
Patient ID: Nicholas Caldwell, male   DOB: [birth year], 73 y.o.   MRN: [id number]     Advanced Heart Failure Rounding Note  PCP-Cardiologist: No primary care provider on file.   Subjective:    Now off milrinone. He is on Lasix gtt at 20 mg/hr no metolazone yesterday.  I/Os negative again, weight stable.  CVP 11-12 today, the lowest he has been.  BUN/creatinine 88/2.12 => 94/2.23 => 87/1.88 => 88/1.9 => 84/1.78 =>  87/2.15 => 91/2.13.   Very anxious to go home.  No dyspnea at rest.  Does not want to go to CIR.   He is in NSR this morning.   MRI head with small chronic infarctions in cerebellum and small acute/early subacute infarctions left posterior frontal lobe.    Objective:   Weight Range: 121.2 kg Body mass index is 36.24 kg/m.   Vital Signs:   Temp:  [97 F (36.1 C)-98.8 F (37.1 C)] 98.8 F (37.1 C) (05/26 0740) Pulse Rate:  [73-120] 120 (05/26 0402) Resp:  [20] 20 (05/26 0402) BP: (98-109)/(50-55) 109/55 (05/26 0740) SpO2:  [91 %-99 %] 98 % (05/26 0832) Weight:  [121.2 kg] 121.2 kg (05/26 0656) Last BM Date: 03/28/19  Weight change: Filed Weights   03/27/19 0337 03/28/19 0414 03/29/19 0656  Weight: 121.2 kg 120.7 kg 121.2 kg    Intake/Output:   Intake/Output Summary (Last 24 hours) at 03/29/2019 0834 Last data filed at 03/29/2019 0656 Gross per 24 hour  Intake 2344.54 ml  Output 4225 ml  Net -1880.46 ml      Physical Exam    General: NAD Neck: JVP 10-12 cm, no thyromegaly or thyroid nodule.  Lungs: Decreased BS at bases.  CV: Nondisplaced PMI.  Heart regular S1/S2, no S3/S4, no murmur.  Lower legs wrapped, edema improved. Abdomen: Soft, nontender, no hepatosplenomegaly, no distention.  Skin: Intact without lesions or rashes.  Neurologic: Alert and oriented x 3.  Psych: Normal affect. Extremities: No clubbing or cyanosis.  HEENT: Normal.    Telemetry   NSR 70-80s (personally reviewed).   Labs    CBC Recent Labs    03/28/19 0400 03/29/19 0422   WBC 10.6* 8.8  NEUTROABS 8.5* 7.0  HGB 8.9* 8.4*  HCT 29.9* 28.7*  MCV 86.9 87.0  PLT 372 093   Basic Metabolic Panel Recent Labs    03/28/19 0400 03/29/19 0422  NA 136 138  K 3.4* 3.1*  CL 84* 90*  CO2 42* 38*  GLUCOSE 279* 165*  BUN 87* 91*  CREATININE 2.15* 2.13*  CALCIUM 8.5* 8.3*  MG 2.2 2.3   Liver Function Tests No results for input(s): AST, ALT, ALKPHOS, BILITOT, PROT, ALBUMIN in the last 72 hours. No results for input(s): LIPASE, AMYLASE in the last 72 hours. Cardiac Enzymes No results for input(s): CKTOTAL, CKMB, CKMBINDEX, TROPONINI in the last 72 hours.  BNP: BNP (last 3 results) Recent Labs    12/01/18 0015 12/30/18 1621 03/06/19 1829  BNP 1,666.7* 1,273.0* 1,020.1*    ProBNP (last 3 results) No results for input(s): PROBNP in the last 8760 hours.   D-Dimer No results for input(s): DDIMER in the last 72 hours. Hemoglobin A1C No results for input(s): HGBA1C in the last 72 hours. Fasting Lipid Panel No results for input(s): CHOL, HDL, LDLCALC, TRIG, CHOLHDL, LDLDIRECT in the last 72 hours. Thyroid Function Tests No results for input(s): TSH, T4TOTAL, T3FREE, THYROIDAB in the last 72 hours.  Invalid input(s): FREET3  Other results:   Imaging    No  results found.   Medications:     Scheduled Medications: . apixaban  5 mg Oral BID  . arformoterol  15 mcg Nebulization BID  . bisoprolol  2.5 mg Oral Daily  . budesonide (PULMICORT) nebulizer solution  0.5 mg Nebulization BID  . ferrous sulfate  325 mg Oral BID WC  . folic acid  1 mg Oral Daily  . gabapentin  600 mg Oral BID  . Gerhardt's butt cream   Topical Daily  . guaiFENesin  400 mg Oral BID  . hydrocortisone  25 mg Rectal BID  . insulin aspart  0-15 Units Subcutaneous TID WC  . insulin aspart  0-5 Units Subcutaneous QHS  . insulin aspart  8 Units Subcutaneous TID WC  . insulin NPH Human  20 Units Subcutaneous BID AC & HS  . ipratropium-albuterol  3 mL Nebulization TID  .  isosorbide-hydrALAZINE  0.5 tablet Oral TID  . latanoprost  1 drop Both Eyes QHS  . liver oil-zinc oxide   Topical BID  . pantoprazole  40 mg Oral BID  . polyethylene glycol  17 g Oral Daily  . potassium chloride  40 mEq Oral Once  . rosuvastatin  20 mg Oral q1800  . spironolactone  25 mg Oral Daily  . torsemide  100 mg Oral BID    Infusions: . ceFEPime (MAXIPIME) IV Stopped (03/28/19 2207)  . vancomycin      PRN Medications: acetaminophen, acetylcysteine, alum & mag hydroxide-simeth, diazepam, docusate sodium, fluticasone, hydrocortisone cream, lidocaine-prilocaine, ondansetron (ZOFRAN) IV, oxyCODONE-acetaminophen, sodium chloride flush   Assessment/Plan   1. Acute on chronic systolic CHF: Nonischemic cardiomyopathy.  With prominent RV failure, likely related to COPD/OSA/OHS. TEE in 3/20 with EF 40-45%, severely dilated RV with moderately decreased systolic function. He has had multiple recent admissions with CHF and COPD exacerbations.  He has not cooperated with paramedicine or [facility] home health as outpatient, has not been using CPAP per his wife, suspect significant medication noncompliance as well though he denies.  Milrinone started 5/9 for RV support. Now off milrinone with adequate co-ox.  He diuresed well again yesterday though weight not down.  His CVP this morning is 11-12, this is the best that it has been.  With RV failure, suspect we will not get CVP to normal range.   - He can stay off milrinone.   - He remains somewhat volume overloaded but diuresis limited by cardiorenal syndrome. Without HD, I think this is absolutely as good as we can get. Situation complicated by dietary indiscretion and inability to care for himself. I do not see how we can maintain him outside of the hospital.  I will switch him to torsemide 100 mg bid at this point and replace K.  - Continue UNNA boots - Continue bisoprolol 2.5 daily.  - Continue Bidil 0.5 tab tid  - Continue spironolactone 25 mg  daily.    - Fluid restrict.  - I think his long-term prognosis, unfortunately, will be poor with significant RV dysfunction and history of poor compliance.  2. Anemia: Baseline hgb around 8.  6.7 at admission, denies overt GI bleeding.  He has had 2 unit PRBCs and IV Fe.  Hgb 7.4 today, lower but no overt bleeding.  Colonoscopy 5/8 unremarkable but EGD showed necrotic mucosa gastric fundus/body that is concerning for gastric cancer, biopsies taken which were negative for malignancy. Hgb 8.4 today.  - Continue Eliquis - Has received IV iron per primary team and GI 3. Atrial fibrillation/flutter: Patient has history  of paroxysmal atrial fibrillation.  This admission, he appears to be in an atypical atrial flutter.  Rate is currently controlled. Xarelto initially held with GI bleeding.  Was seen in past by Dr. Rayann Heman, not thought to be good ablation candidate.  He has not been on Tikosyn or amiodarone due to history of long QT. He is in NSR today.  - May eventually ask EP to reconsider ablation given lack of good anti-arrhythmic options. He seems to do better in NSR.  - Continue Eliquis.  4. COPD: Severe, on 3L home oxygen.  He was wheezing initially but now much improved.  This may be due to pulmonary edema, but also consider component of COPD exacerbation.  - Per primary team  5. OHS/OSA: On home oxygen.  Per wife, has not been using CPAP at home.  Will give CPAP at night here.  6. ID: Suspect this is primarily a CHF exacerbation.  Afebrile and WBCs not significantly elevated.  1 blood culture positive for what appears to be coagulase negative Staph (probably contaminant).  COVID-19 negative. He had a fever over the weekend, started on cefepime empirically by primary team.  7. CAD: Nonobstructive on prior cath.  No s/s ischemia. He is on Crestor. Off ASA with DOAC and GI bleed  8. Acute on chronic kidney failure III: Baseline seems to be 1.3-1.6.  Creatinine 2.13 today.   9. DM2 with poor control -  TRH managing  10. CVA: MRI head showed both small acute/subacute and old CVAs.  Neuro has seen, suspect related to atrial fibrillation.  - He has started on Eliquis (was on Xarelto prior).  11. Hypokalemia: supplement.  12. Penile wound: Per primary.   From my standpoint, we have reached the extent of what we can do with an acute hospitalization.  I am fine with him going to CIR (ideally) or home (if he refuses CIR).  As above, I am not optimistic that we will keep him out of the hospital.   Length of Stay: 23  Loralie Champagne, MD  03/29/2019, 8:34 AM  Advanced Heart Failure Team Pager [phone] (M-F; 7a - 4p)  Please contact [facility] for night-coverage after hours (4p -7a ) and weekends on [email or web address]

## 2019-03-29 NOTE — Plan of Care (Signed)
  Problem: Education: Goal: Knowledge of General Education information will improve Description Including pain rating scale, medication(s)/side effects and non-pharmacologic comfort measures Outcome: Progressing   Problem: Health Behavior/Discharge Planning: Goal: Ability to manage health-related needs will improve Outcome: Progressing   Problem: Clinical Measurements: Goal: Ability to maintain clinical measurements within normal limits will improve Outcome: Progressing Goal: Respiratory complications will improve Outcome: Progressing Goal: Cardiovascular complication will be avoided Outcome: Progressing   Problem: Coping: Goal: Level of anxiety will decrease Outcome: Progressing   Problem: Elimination: Goal: Will not experience complications related to bowel motility Outcome: Progressing   Problem: Pain Managment: Goal: General experience of comfort will improve Outcome: Progressing   Problem: Safety: Goal: Ability to remain free from injury will improve Outcome: Progressing   Problem: Skin Integrity: Goal: Risk for impaired skin integrity will decrease Outcome: Progressing

## 2019-03-29 NOTE — Progress Notes (Signed)
Speech Language Pathology Treatment: Cognitive-Linquistic  Patient Details Name: Nicholas Caldwell MRN: [id number] DOB: [birth year] Today's Date: 03/29/2019 Time: 3875-6433 SLP Time Calculation (min) (ACUTE ONLY): 26 min  Assessment / Plan / Recommendation Clinical Impression  Pt reported that he does not have any cognitive-linguistic deficits and does not require any speech-language pathology services. Pt was educated regarding the cognitive-linguistic deficits noted during the initial evaluation and the purpose of SLP services but remained insistent that he does not need any SLP services. Pt was amenable to re-assessment to determine his current level of function. The [facility] Cognitive Assessment 8.1 was therefore completed to re-evaluate the pt's cognitive-linguistic skills. He achieved a score of 18/30 which is below the normal limits of 26 or more out of 30 and is suggestive of a mild impairment. He demonstrated deficits in the areas of attention, mental manipulation, divergent naming, abstract reasoning, and delayed recall. Pt expressed multiple times during the re-assessment that he does not want to do this and that he is doing fine. The impact of his willingness to participate in the session and his report of "not liking stuff like this" on his performance is considered. However, pt also reported that he believes he would have performed similarly prior to admission. Considering pt's report of being at baseline, his improved cognition noted by PT/OT this week, and his consistent resistance to participation in cognitive-linguistic treatment, SLP services will be discontinued at this level of care. Pt and nursing have been educated regarding this and both parties verbalized agreement with plan of care.                   SLP Re-Evaluation Cognition  Overall Cognitive Status: (Pt reports he is at his baseline cognitive level) Arousal/Alertness: Awake/alert Orientation Level: Oriented to  person;Oriented to place(Oriented to date, month, year, not day) Attention: Focused;Sustained Focused Attention: Appears intact(Vigilance WNL: 1/1) Sustained Attention: Impaired Sustained Attention Impairment: Verbal complex(Serial 7s: 2/3) Memory: Impaired Memory Impairment: Retrieval deficit;Decreased recall of new information(Immediate: 4/5; Delayed: 3/5 with cues: 1/2) Executive Function: Sequencing;Organizing;Reasoning Reasoning: Impaired Reasoning Impairment: Verbal complex(Abstraction: 1/2) Sequencing: Impaired Sequencing Impairment: Verbal complex(Clock drawing: 2/3) Organizing: Impaired Organizing Impairment: Verbal complex(Backward digit span: 0/1)       Comprehension  Auditory Comprehension Overall Auditory Comprehension: Appears within functional limits for tasks assessed Complex Commands: (Trail completion: 0/1) Visual Recognition/Discrimination Discrimination: Not tested Reading Comprehension Reading Status: Not tested    Expression Expression Primary Mode of Expression: Verbal Verbal Expression Overall Verbal Expression: Appears within functional limits for tasks assessed Initiation: No impairment Level of Generative/Spontaneous Verbalization: Sentence;Conversation Repetition: No impairment(2/2) Confrontation: Within functional limits(3/3) Divergent: (0/1; pt provided 1 item per category within 1 minute) Pragmatics: No impairment Written Expression Dominant Hand: Right Written Expression: (Copying cube: 1/1)   Oral / Motor  Oral Motor/Sensory Function Overall Oral Motor/Sensory Function: Within functional limits        Nicholas Caldwell Nicholas Caldwell: Pt is a 73 y.o. male admitted 03/06/19 with acute on chronic heart failure and respiratory failure. Found to have severe iron deficiency anemia; now s/p small bowel enteroscopy and biopsy 5/12. Plan for DCCV 5/15. PMH includes CHF, chronic anemia, HLD, obesity, CAD, NICM, polyneuropathy, DM, COPD. MRI on 5/17 revealed small group of  subcentimeter acute/early subacute infarctions of left posterior frontal lobe subcortical white matter; very small chronic infarctions within bilateral cerebellar hemispheres.       SLP Plan  Discharge SLP treatment due to (comment)(See "clinical impressions")       Recommendations  Plan: Discharge SLP treatment due to (comment)(See "clinical impressions")       Kamani Lewter I. Hardin Negus, [place], [place] Office number [phone] Pager [place] 03/29/2019, 12:40 PM

## 2019-03-29 NOTE — Progress Notes (Signed)
SLP Cancellation Note  Patient Details Name: Nicholas Caldwell MRN: [id number] DOB: 1946/06/23   Cancelled treatment:       Reason Eval/Treat Not Completed: Patient at procedure or test/unavailable. Pt receiving medications from nursing and subsequently with MD. SLP will re-attempt treatment as able.   Felix Meras I. Hardin Negus, [place], [place] Office number [phone] Pager [place] 03/29/2019, 9:13 AM

## 2019-03-29 NOTE — Progress Notes (Addendum)
Inpatient Rehab Admissions Coordinator:   I attempted to reach pt's wife yesterday but have not heard back.  Note pt ambulated 1' with min guard with PT yesterday, which may be close to pt's baseline.  I will attempt to contact wife again to see if she is agreeable to CIR.   Addendum: I was able to reach patient's wife.  She prefers pt to come home with home health therapies rather than CIR. I will let CM and RN know.   Shann Medal, PT, DPT Admissions Coordinator [phone] 03/29/19  11:35 AM

## 2019-03-29 NOTE — Progress Notes (Signed)
PROGRESS NOTE    Nicholas Caldwell  DGL:[id number] DOB: [birth year] DOA: 03/06/2019 PCP: [facility], [facility]    Brief Narrative:  73 y.o.Mwith CAD, isch CM EF 40%, sdCHF, pAF on Xarelto, pHTN, MO, OSA not compliant with CPAP, COPD on home O2 3L, and smoking who presented with swelling and dyspnea.  Patient had had slow progressive dyspnea, SOB with exertion, also significant peripheral edema. In ER, BNP >1000, CXR with edema, HR 124 in Afib, and new anemia Hgb 6.7 g/dL. Transfused and started on Lasix.  Assessment & Plan:   Principal Problem:   Acute on chronic respiratory failure with hypoxia and hypercapnia (HCC) Active Problems:   Essential hypertension   Type 2 diabetes mellitus with neuropathy   PAF (paroxysmal atrial fibrillation) (HCC)   COPD (chronic obstructive pulmonary disease) (HCC)   Medically noncompliant   Atrial fibrillation with RVR (HCC)   AKI (acute kidney injury) ([place])   Acute on chronic anemia   Acute on chronic systolic (congestive) heart failure (HCC)   Acute on chronic systolic CHF (congestive heart failure) (HCC)   Iron deficiency anemia   NSVT (nonsustained ventricular tachycardia) (HCC)   Tobacco abuse counseling   Gastritis and gastroduodenitis   Cerebral embolism with cerebral infarction   Acute versus subacute ischemic stroke, likely cardioembolic Overnight, patient was apparently confused and nonverbal for a few seconds.  Patient states he was angry at nurses and was just not communicating.  Rapid response was initiated with a CT head notable for small bilateral cerebellar infarcts, question chronic versus acute.  EEG with no seizure activity.  MR brain notable for small group of left frontal acute versus early subacute strokes in the setting of a flutter/atrial fibrillation. --Neurology had been following initially --Lipid panel 5/2 w/ TC 83, LDL 18, HDL 43, TG 109 --HbA1c 6.07 Nov 2018 --Bilateral carotid Dopplers with no significant findings  --continue crestor as tolerated --Continue anticoagulation with Eliquis. No sign of acute bleeding --continue to monitor on telemetry --Continued on  home gabapentin as tolerated - Currently stable  Acute on chronic hypoxic respiratory failure: resolved Patient presenting from home with progressive dyspnea associated with worsening lower extremity edema.  Likely multifactorial from acute CHF exacerbation and COPD flare, as well as anemia, NSVT/AF.  Chest x-ray on admission notable for bilateral interstitial and alveolar airspace opacities with cardiomegaly. --Cont to wean O2 as tolerated --Cont to wean O2. Currently on 4LNC  Acute on chronic systolic and diastolic CHF EF 51-88% on TEE in Mar.  --Cardiology/HF team following, appreciate assistance --Transitioned metolazone, Acetazolamide and Lasix gtt to torsemide 13m PO BID on 5/18 by cards due to increase in creatinine --Had required lasix gtt this admit, now transitioned to PO torsemide --ContinueCrestor --ContinueBB as tolerated --Patient noted to have multiple snack items at bedside brought by wife recently with foods high in salt content. Counseling was done at bedside  Atrial fibrillation/atypical aflutter --Continues with persistent A. fib/flutter on telemetry.  Cardiology following, appreciate assistance.  Has been discussed with EP, not a optimal ablation candidate, may consider in the future. --Initially started on heparin drip on 03/15/2019; transitioned to Eliquis 5/14 --Hgb stable, 8.9  --continue beta-blocker --Continue to monitor on telemetry - Currently rate controlled  Acute kidney injury Creatinine baseline 1.2 - 1.6 for the last 6 months roughly, trending up from 0.9 last few years. AKI again this hospitalization, has been increasing last few days with diuresis. -Cr stable at 2.13 -Repeat bmet in AM  Iron deficiency anemia Acute blood loss  anemia Upper GI bleed Suspected chronic GI blood lossfrom  gastric ulcer.  Received 2 units PRBCs during the hospitalization. EGD 5/12 with multiple nonbleeding erosions, one nonbleeding superficial gastric ulcer with clean ulcer base --Pathology from stomach biopsy negative for metaplasia, dysplasia or malignancy, no H. pylori --s/p Feraheme 5/4, 5/12 --Continue protonix 13m BID x 12 weeks --Minimize NSAID use --Continue to monitor daily CBC, especially now on Eliquis --GI recommends outpatient follow-up and consideration of gastric mapping in the future as an outpatient; will need follow-up with Dr. CBryan Lemma --Without acute bleeding at this time  Acute COPD exacerbation Patient with home O2 dependence at 3 L per nasal cannula. --completed course of steroids and doxycycline --Pulmicort/Brovana --Continue nebs prn --Chest PT, Mucinex, Mucomyst prn --Continue to titrate supplemental oxygen to maintain SPO2 greater than 88% - Presently on 4LNC, cont to wean as tolerated  Positive blood culture --CoNS in 1/2. Likely contaminant.  Hypokalemia -K 3.1 today --Potassium replacement ordered this AM --repeat bmet in AM  Diabetes Hemoglobin A1c 6.5 on 11/13/2018.  Home regimen includes metformin 1000 mg p.o. twice daily, NPH 70-30 5u BID.  Glucose poorly controlled during hospitalization, likely exacerbated by need of IV steroids for COPD exacerbation as above.  Also with likely dietary noncompliance. --NPH continued on 20u SQ BID --Novolog continued on 8u TIDAC --Cont on SSI coverage --Glucose trends reviewed and remain stable  Hypertension Coronary disease secondary prevention -Continuewith Crestor, BB as patient tolerates  Paroxysmal atrial fibrillation NSVT -CHA2DS2-Vasc =4 (Age, CHF, PVD, DM). -continue anticoagulation with Eliquis -Continue with beta blocker as tolerated  OHS/OSA/MO --Historically noncompliant with CPAP at home per report, but has been compliant while inpatient --Continue to encourage compliance with  CPAP nocturnally  Chronic pain -Chief complaint seems to be primarily pain medications -NCCSR reviewed and med rec reviewed. Pt on 122mpercocet q6hrs prior to admit.  -Cont on 1040mercocet dosing, seems to be tolerating currently.  Constipation -Patient reports good results with lactulose -Continue with cathartics as needed -stable at this time  Sepsis with PNA, not present on admit -Pt with temp of 101F with RR in the upper 20's in the AM of 5/25 -Recent CXR with findings suggestive of possible PNA -Empirically started cefepime and vanc -UA is suggestive of UTI -Follow up urine culture -Blood cx thus far neg -MRSA screen was neg, thus will d/c Vanc   DVT prophylaxis: SCD's Code Status: Full Family Communication: Pt in room, family not at bedside Disposition Plan: Possible d/c home in 24hrs  Consultants:   Cardiology  CIR  GI  Procedures:     Antimicrobials: Anti-infectives (From admission, onward)   Start     Dose/Rate Route Frequency Ordered Stop   03/29/19 1100  vancomycin (VANCOCIN) IVPB 1000 mg/200 mL premix  Status:  Discontinued     1,000 mg 200 mL/hr over 60 Minutes Intravenous Every 24 hours 03/28/19 0944 03/29/19 0917   03/28/19 2200  ceFEPIme (MAXIPIME) 2 g in sodium chloride 0.9 % 100 mL IVPB  Status:  Discontinued     2 g 200 mL/hr over 30 Minutes Intravenous Every 12 hours 03/28/19 0944 03/28/19 1114   03/28/19 1115  ceFEPIme (MAXIPIME) 2 g in sodium chloride 0.9 % 100 mL IVPB     2 g 200 mL/hr over 30 Minutes Intravenous Every 12 hours 03/28/19 1114     03/28/19 1030  vancomycin (VANCOCIN) 2,500 mg in sodium chloride 0.9 % 500 mL IVPB     2,500 mg 250 mL/hr over 120  Minutes Intravenous  Once 03/28/19 0944 03/28/19 1900   03/28/19 0930  ceFEPIme (MAXIPIME) 1 g in sodium chloride 0.9 % 100 mL IVPB  Status:  Discontinued     1 g 200 mL/hr over 30 Minutes Intravenous Every 8 hours 03/28/19 0917 03/28/19 0944   03/11/19 1000  doxycycline  (VIBRA-TABS) tablet 100 mg  Status:  Discontinued     100 mg Oral Every 12 hours 03/11/19 0733 03/18/19 0646   03/06/19 1945  ceFEPIme (MAXIPIME) 2 g in sodium chloride 0.9 % 100 mL IVPB     2 g 200 mL/hr over 30 Minutes Intravenous  Once 03/06/19 1941 03/06/19 2046   03/06/19 1945  vancomycin (VANCOCIN) 2,000 mg in sodium chloride 0.9 % 500 mL IVPB     2,000 mg 250 mL/hr over 120 Minutes Intravenous  Once 03/06/19 1941 03/07/19 0015      Subjective: Reports feeling better today  Objective: Vitals:   03/29/19 0826 03/29/19 0828 03/29/19 0832 03/29/19 1040  BP:    (!) 96/52  Pulse:      Resp:      Temp:    97.7 F (36.5 C)  TempSrc:    Oral  SpO2: 94% 99% 98%   Weight:      Height:        Intake/Output Summary (Last 24 hours) at 03/29/2019 1217 Last data filed at 03/29/2019 0900 Gross per 24 hour  Intake 2376.54 ml  Output 4000 ml  Net -1623.46 ml   Filed Weights   03/27/19 0337 03/28/19 0414 03/29/19 0656  Weight: 121.2 kg 120.7 kg 121.2 kg    Examination: General exam: Conversant, in no acute distress Respiratory system: normal chest rise, clear, no audible wheezing Cardiovascular system: regular rhythm, s1-s2 Gastrointestinal system: Nondistended, nontender, pos BS Central nervous system: No seizures, no tremors Extremities: No cyanosis, no joint deformities Skin: No rashes, no pallor Psychiatry: Affect normal // no auditory hallucinations   Data Reviewed: I have personally reviewed following labs and imaging studies  CBC: Recent Labs  Lab 03/24/19 0444 03/26/19 0450 03/27/19 0415 03/28/19 0400 03/29/19 0422  WBC 8.5 6.3 6.5 10.6* 8.8  NEUTROABS  --  4.5 4.9 8.5* 7.0  HGB 8.7* 8.5* 9.0* 8.9* 8.4*  HCT 29.6* 28.5* 30.6* 29.9* 28.7*  MCV 87.6 86.9 86.9 86.9 87.0  PLT 390 406* 413* 372 379   Basic Metabolic Panel: Recent Labs  Lab 03/23/19 0454  03/25/19 0500 03/26/19 0450 03/27/19 0415 03/27/19 0514 03/28/19 0400 03/29/19 0422  NA 139   < >  141 139 139  --  136 138  K 4.3   < > 3.3* 3.0* 3.1*  --  3.4* 3.1*  CL 92*   < > 90* 88* 86*  --  84* 90*  CO2 35*   < > 37* 39* 39*  --  42* 38*  GLUCOSE 54*   < > 189* 183* 185*  --  279* 165*  BUN 88*   < > 87* 88* 84*  --  87* 91*  CREATININE 2.12*   < > 1.88* 1.90* 1.78*  --  2.15* 2.13*  CALCIUM 8.6*   < > 8.4* 8.6* 8.7*  --  8.5* 8.3*  MG 2.4  --   --   --   --  2.5* 2.2 2.3   < > = values in this interval not displayed.   GFR: Estimated Creatinine Clearance: 43.4 mL/min (A) (by C-G formula based on SCr of 2.13 mg/dL (H)). Liver Function Tests:  No results for input(s): AST, ALT, ALKPHOS, BILITOT, PROT, ALBUMIN in the last 168 hours. No results for input(s): LIPASE, AMYLASE in the last 168 hours. No results for input(s): AMMONIA in the last 168 hours. Coagulation Profile: No results for input(s): INR, PROTIME in the last 168 hours. Cardiac Enzymes: No results for input(s): CKTOTAL, CKMB, CKMBINDEX, TROPONINI in the last 168 hours. BNP (last 3 results) No results for input(s): PROBNP in the last 8760 hours. HbA1C: No results for input(s): HGBA1C in the last 72 hours. CBG: Recent Labs  Lab 03/28/19 1121 03/28/19 1648 03/28/19 2113 03/29/19 0612 03/29/19 1042  GLUCAP 150* 115* 162* 113* 166*   Lipid Profile: No results for input(s): CHOL, HDL, LDLCALC, TRIG, CHOLHDL, LDLDIRECT in the last 72 hours. Thyroid Function Tests: No results for input(s): TSH, T4TOTAL, FREET4, T3FREE, THYROIDAB in the last 72 hours. Anemia Panel: No results for input(s): VITAMINB12, FOLATE, FERRITIN, TIBC, IRON, RETICCTPCT in the last 72 hours. Sepsis Labs: No results for input(s): PROCALCITON, LATICACIDVEN in the last 168 hours.  Recent Results (from the past 240 hour(s))  Culture, blood (routine x 2) Call MD if unable to obtain prior to antibiotics being given     Status: None (Preliminary result)   Collection Time: 03/28/19  9:40 AM  Result Value Ref Range Status   Specimen Description  BLOOD LEFT HAND  Final   Special Requests   Final    BOTTLES DRAWN AEROBIC ONLY Blood Culture adequate volume   Culture   Final    NO GROWTH < 24 HOURS Performed at [facility], [address], [place], [place] [address]    Report Status PENDING  Incomplete  Culture, blood (routine x 2) Call MD if unable to obtain prior to antibiotics being given     Status: None (Preliminary result)   Collection Time: 03/28/19  9:40 AM  Result Value Ref Range Status   Specimen Description BLOOD LEFT HAND  Final   Special Requests   Final    BOTTLES DRAWN AEROBIC ONLY Blood Culture adequate volume   Culture   Final    NO GROWTH < 24 HOURS Performed at [facility], [address] [place], [place] [address]    Report Status PENDING  Incomplete  MRSA PCR Screening     Status: None   Collection Time: 03/29/19  4:10 AM  Result Value Ref Range Status   MRSA by PCR NEGATIVE NEGATIVE Final    Comment:        The GeneXpert MRSA Assay (FDA approved for NASAL specimens only), is one component of a comprehensive MRSA colonization surveillance program. It is not intended to diagnose MRSA infection nor to guide or monitor treatment for MRSA infections. Performed at [facility], [place] [address], [place], [place] [address]   Culture, sputum-assessment     Status: None   Collection Time: 03/29/19  9:19 AM  Result Value Ref Range Status   Specimen Description SPUTUM  Final   Special Requests NONE  Final   Sputum evaluation   Final    Sputum specimen not acceptable for testing.  Please recollect.   Gram Stain Report Called to,Read Back By and Verified With: Caprice Red RN, AT 1121 03/29/19 BY Rush Landmark Performed at [facility], [place] [address], [place], [place] [address]    Report Status 03/29/2019 FINAL  Final     Radiology Studies: No results found.  Scheduled Meds: . apixaban  5 mg Oral BID  .  arformoterol  15 mcg Nebulization BID  . bisoprolol  2.5 mg Oral Daily  .  budesonide (PULMICORT) nebulizer solution  0.5 mg Nebulization BID  . ferrous sulfate  325 mg Oral BID WC  . folic acid  1 mg Oral Daily  . gabapentin  600 mg Oral BID  . Gerhardt's butt cream   Topical Daily  . guaiFENesin  400 mg Oral BID  . hydrocortisone  25 mg Rectal BID  . insulin aspart  0-15 Units Subcutaneous TID WC  . insulin aspart  0-5 Units Subcutaneous QHS  . insulin aspart  8 Units Subcutaneous TID WC  . insulin NPH Human  20 Units Subcutaneous BID AC & HS  . ipratropium-albuterol  3 mL Nebulization TID  . isosorbide-hydrALAZINE  0.5 tablet Oral TID  . latanoprost  1 drop Both Eyes QHS  . liver oil-zinc oxide   Topical BID  . pantoprazole  40 mg Oral BID  . polyethylene glycol  17 g Oral Daily  . rosuvastatin  20 mg Oral q1800  . spironolactone  25 mg Oral Daily  . torsemide  100 mg Oral BID   Continuous Infusions: . ceFEPime (MAXIPIME) IV Stopped (03/28/19 2207)     LOS: 23 days   Marylu Lund, MD [facility] Pager On Amion  If 7PM-7AM, please contact night-coverage 03/29/2019, 12:17 PM

## 2019-03-30 ENCOUNTER — Telehealth (HOSPITAL_COMMUNITY): Payer: Self-pay | Admitting: Licensed Clinical Social Worker

## 2019-03-30 LAB — CBC WITH DIFFERENTIAL/PLATELET
Abs Immature Granulocytes: 0.01 10*3/uL (ref 0.00–0.07)
Basophils Absolute: 0 10*3/uL (ref 0.0–0.1)
Basophils Relative: 0 %
Eosinophils Absolute: 0.3 10*3/uL (ref 0.0–0.5)
Eosinophils Relative: 4 %
HCT: 29.1 % — ABNORMAL LOW (ref 39.0–52.0)
Hemoglobin: 8.6 g/dL — ABNORMAL LOW (ref 13.0–17.0)
Immature Granulocytes: 0 %
Lymphocytes Relative: 11 %
Lymphs Abs: 0.9 10*3/uL (ref 0.7–4.0)
MCH: 25.5 pg — ABNORMAL LOW (ref 26.0–34.0)
MCHC: 29.6 g/dL — ABNORMAL LOW (ref 30.0–36.0)
MCV: 86.4 fL (ref 80.0–100.0)
Monocytes Absolute: 0.7 10*3/uL (ref 0.1–1.0)
Monocytes Relative: 9 %
Neutro Abs: 6.3 10*3/uL (ref 1.7–7.7)
Neutrophils Relative %: 76 %
Platelets: 329 10*3/uL (ref 150–400)
RBC: 3.37 MIL/uL — ABNORMAL LOW (ref 4.22–5.81)
RDW: 22.8 % — ABNORMAL HIGH (ref 11.5–15.5)
WBC: 8.3 10*3/uL (ref 4.0–10.5)
nRBC: 0 % (ref 0.0–0.2)

## 2019-03-30 LAB — BASIC METABOLIC PANEL
Anion gap: 13 (ref 5–15)
BUN: 91 mg/dL — ABNORMAL HIGH (ref 8–23)
CO2: 39 mmol/L — ABNORMAL HIGH (ref 22–32)
Calcium: 8.4 mg/dL — ABNORMAL LOW (ref 8.9–10.3)
Chloride: 87 mmol/L — ABNORMAL LOW (ref 98–111)
Creatinine, Ser: 1.99 mg/dL — ABNORMAL HIGH (ref 0.61–1.24)
GFR calc Af Amer: 38 mL/min — ABNORMAL LOW (ref >60)
GFR calc non Af Amer: 33 mL/min — ABNORMAL LOW (ref >60)
Glucose, Bld: 133 mg/dL — ABNORMAL HIGH (ref 70–99)
Potassium: 3 mmol/L — ABNORMAL LOW (ref 3.5–5.1)
Sodium: 139 mmol/L (ref 135–145)

## 2019-03-30 LAB — GLUCOSE, CAPILLARY
Glucose-Capillary: 123 mg/dL — ABNORMAL HIGH (ref 70–99)
Glucose-Capillary: 193 mg/dL — ABNORMAL HIGH (ref 70–99)
Glucose-Capillary: 168 mg/dL — ABNORMAL HIGH (ref 70–99)
Glucose-Capillary: 71 mg/dL (ref 70–99)

## 2019-03-30 LAB — MAGNESIUM: Magnesium: 2.1 mg/dL (ref 1.7–2.4)

## 2019-03-30 MED ORDER — CEFDINIR 300 MG PO CAPS
300.0000 mg | ORAL_CAPSULE | Freq: Two times a day (BID) | ORAL | Status: DC
  Administered 2019-03-30 – 2019-03-31 (×3): 300 mg via ORAL
  Filled 2019-03-30 (×5): qty 1

## 2019-03-30 MED ORDER — POTASSIUM CHLORIDE CRYS ER 20 MEQ PO TBCR
40.0000 meq | EXTENDED_RELEASE_TABLET | Freq: Two times a day (BID) | ORAL | Status: DC
  Administered 2019-03-30: 40 meq via ORAL
  Filled 2019-03-30: qty 2

## 2019-03-30 MED ORDER — POTASSIUM CHLORIDE CRYS ER 20 MEQ PO TBCR
40.0000 meq | EXTENDED_RELEASE_TABLET | Freq: Three times a day (TID) | ORAL | Status: DC
  Administered 2019-03-30 (×2): 40 meq via ORAL
  Filled 2019-03-30 (×2): qty 2

## 2019-03-30 NOTE — Telephone Encounter (Signed)
CSW consulted to add patient to [facility].  Referral created and sent to clinic for review and to be signed by physician.  CSW will continue to follow and assist as needed  Jorge Ny, [facility] Desk#: [phone] Cell#: [phone]

## 2019-03-30 NOTE — Progress Notes (Signed)
Patient ID: Nicholas Caldwell, male   DOB: Mar 21, 1946, 73 y.o.   MRN: [id number]     Advanced Heart Failure Rounding Note  PCP-Cardiologist: No primary care provider on file.   Subjective:    Now off milrinone. He is on torsemide 100 mg bid with good UOP yesterday.  CVP 14-15 today. BUN/creatinine 88/2.12 => 94/2.23 => 87/1.88 => 88/1.9 => 84/1.78 =>  87/2.15 => 91/2.13 => 91/1.99.   Very anxious to go home.  No dyspnea at rest.  Does not want to go to CIR.   He is in NSR this morning.   MRI head with small chronic infarctions in cerebellum and small acute/early subacute infarctions left posterior frontal lobe.    Objective:   Weight Range: 122.5 kg Body mass index is 36.63 kg/m.   Vital Signs:   Temp:  [97.7 F (36.5 C)-99 F (37.2 C)] 98.8 F (37.1 C) (05/27 0717) Pulse Rate:  [68-76] 68 (05/27 0335) Resp:  [17-24] 24 (05/27 0335) BP: (96-112)/(48-63) 110/62 (05/27 0717) SpO2:  [92 %-100 %] 96 % (05/27 0335) Weight:  [122.5 kg] 122.5 kg (05/27 0604) Last BM Date: 03/29/19  Weight change: Filed Weights   03/28/19 0414 03/29/19 0656 03/30/19 0604  Weight: 120.7 kg 121.2 kg 122.5 kg    Intake/Output:   Intake/Output Summary (Last 24 hours) at 03/30/2019 0920 Last data filed at 03/30/2019 0719 Gross per 24 hour  Intake 816 ml  Output 3250 ml  Net -2434 ml      Physical Exam    General: NAD Neck: JVP 12 cm, no thyromegaly or thyroid nodule.  Lungs: End expiratory wheezes CV: Nondisplaced PMI.  Heart regular S1/S2, no S3/S4, no murmur.  Legs wrapped. Abdomen: Soft, nontender, no hepatosplenomegaly, no distention.  Skin: Intact without lesions or rashes.  Neurologic: Alert and oriented x 3.  Psych: Normal affect. Extremities: No clubbing or cyanosis.  HEENT: Normal.    Telemetry   NSR 70-80s (personally reviewed).   Labs    CBC Recent Labs    03/29/19 0422 03/30/19 0500  WBC 8.8 8.3  NEUTROABS 7.0 6.3  HGB 8.4* 8.6*  HCT 28.7* 29.1*  MCV 87.0 86.4   PLT 323 654   Basic Metabolic Panel Recent Labs    03/29/19 0422 03/30/19 0500  NA 138 139  K 3.1* 3.0*  CL 90* 87*  CO2 38* 39*  GLUCOSE 165* 133*  BUN 91* 91*  CREATININE 2.13* 1.99*  CALCIUM 8.3* 8.4*  MG 2.3 2.1   Liver Function Tests No results for input(s): AST, ALT, ALKPHOS, BILITOT, PROT, ALBUMIN in the last 72 hours. No results for input(s): LIPASE, AMYLASE in the last 72 hours. Cardiac Enzymes No results for input(s): CKTOTAL, CKMB, CKMBINDEX, TROPONINI in the last 72 hours.  BNP: BNP (last 3 results) Recent Labs    12/01/18 0015 12/30/18 1621 03/06/19 1829  BNP 1,666.7* 1,273.0* 1,020.1*    ProBNP (last 3 results) No results for input(s): PROBNP in the last 8760 hours.   D-Dimer No results for input(s): DDIMER in the last 72 hours. Hemoglobin A1C No results for input(s): HGBA1C in the last 72 hours. Fasting Lipid Panel No results for input(s): CHOL, HDL, LDLCALC, TRIG, CHOLHDL, LDLDIRECT in the last 72 hours. Thyroid Function Tests No results for input(s): TSH, T4TOTAL, T3FREE, THYROIDAB in the last 72 hours.  Invalid input(s): FREET3  Other results:   Imaging    No results found.   Medications:     Scheduled Medications: . apixaban  5  mg Oral BID  . arformoterol  15 mcg Nebulization BID  . bisoprolol  2.5 mg Oral Daily  . budesonide (PULMICORT) nebulizer solution  0.5 mg Nebulization BID  . ferrous sulfate  325 mg Oral BID WC  . folic acid  1 mg Oral Daily  . gabapentin  600 mg Oral BID  . Gerhardt's butt cream   Topical Daily  . guaiFENesin  400 mg Oral BID  . hydrocortisone  25 mg Rectal BID  . insulin aspart  0-15 Units Subcutaneous TID WC  . insulin aspart  0-5 Units Subcutaneous QHS  . insulin aspart  8 Units Subcutaneous TID WC  . insulin NPH Human  20 Units Subcutaneous BID AC & HS  . ipratropium-albuterol  3 mL Nebulization TID  . isosorbide-hydrALAZINE  0.5 tablet Oral TID  . latanoprost  1 drop Both Eyes QHS  . liver  oil-zinc oxide   Topical BID  . pantoprazole  40 mg Oral BID  . polyethylene glycol  17 g Oral Daily  . potassium chloride  40 mEq Oral TID  . rosuvastatin  20 mg Oral q1800  . spironolactone  25 mg Oral Daily  . torsemide  100 mg Oral BID    Infusions: . ceFEPime (MAXIPIME) IV Stopped (03/29/19 2252)    PRN Medications: acetaminophen, acetylcysteine, alum & mag hydroxide-simeth, diazepam, docusate sodium, fluticasone, hydrocortisone cream, lidocaine-prilocaine, ondansetron (ZOFRAN) IV, oxyCODONE-acetaminophen, sodium chloride flush   Assessment/Plan   1. Acute on chronic systolic CHF: Nonischemic cardiomyopathy.  With prominent RV failure, likely related to COPD/OSA/OHS. TEE in 3/20 with EF 40-45%, severely dilated RV with moderately decreased systolic function. He has had multiple recent admissions with CHF and COPD exacerbations.  He has not cooperated with paramedicine or [facility] home [facility] as outpatient, has not been using CPAP per his wife, suspect significant medication noncompliance as well though he denies.  Milrinone started 5/9 for RV support. Now off milrinone with adequate co-ox.  He diuresed well again yesterday though weight not down, still hard to understand how he is 20 liters net negative by I/Os this admission but weight is basically unchaged.  His CVP this morning is 14-15, I do not think that we are going to get better this hospitalization.  With RV failure, suspect we will not get CVP to normal range.   - He can stay off milrinone.   - He remains volume overloaded but diuresis limited by cardiorenal syndrome. Without HD, I think this is absolutely as good as we can get. Situation complicated by dietary indiscretion and inability to care for himself. I do not see how we can maintain him outside of the hospital.  I will switch him to torsemide 100 mg bid at this point and replace K aggressively.  - Continue UNNA boots - Continue bisoprolol 2.5 daily.  - Continue Bidil 0.5  tab tid  - Continue spironolactone 25 mg daily.    - Fluid restrict.  - I think his long-term prognosis, unfortunately, will be poor with significant RV dysfunction and history of poor compliance.  2. Anemia: Baseline hgb around 8.  6.7 at admission, denies overt GI bleeding.  He has had 2 unit PRBCs and IV Fe.  Hgb 7.4 today, lower but no overt bleeding.  Colonoscopy 5/8 unremarkable but EGD showed necrotic mucosa gastric fundus/body that is concerning for gastric cancer, biopsies taken which were negative for malignancy. Hgb 8.6 today.  - Continue Eliquis - Has received IV iron per primary team and GI 3.  Atrial fibrillation/flutter: Patient has history of paroxysmal atrial fibrillation.  This admission, he appears to be in an atypical atrial flutter.  Rate is currently controlled. Xarelto initially held with GI bleeding.  Was seen in past by Dr. Rayann Heman, not thought to be good ablation candidate.  He has not been on Tikosyn or amiodarone due to history of long QT. He is in NSR today.  - May eventually ask EP to reconsider ablation given lack of good anti-arrhythmic options. He seems to do better in NSR.  - Continue Eliquis.  4. COPD: Severe, on 3L home oxygen.  He was wheezing initially but now much improved.  This may be due to pulmonary edema, but also consider component of COPD exacerbation.  - Per primary team  5. OHS/OSA: On home oxygen.  Per wife, has not been using CPAP at home.  Will give CPAP at night here.  6. ID: Suspect this is primarily a CHF exacerbation.  Afebrile and WBCs not significantly elevated.  1 blood culture positive for what appears to be coagulase negative Staph (probably contaminant).  COVID-19 negative. He had a fever over the weekend, started on cefepime empirically by primary team.  7. CAD: Nonobstructive on prior cath.  No s/s ischemia. He is on Crestor. Off ASA with DOAC and GI bleed  8. Acute on chronic kidney failure III: Baseline seems to be 1.3-1.6.  Creatinine  1.99 today.   9. DM2 with poor control - TRH managing  10. CVA: MRI head showed both small acute/subacute and old CVAs.  Neuro has seen, suspect related to atrial fibrillation.  - He has started on Eliquis (was on Xarelto prior).  11. Hypokalemia: supplement.  12. Penile wound: Per primary.   From my standpoint, we have reached the extent of what we can do with an acute hospitalization.  He should go to CIR but has refused.  As above, I am not optimistic that we will keep him out of the hospital.  We will arrange followup for him next week in CHF clinic and will enroll him in paramedicine program.  Cardiac meds for home: torsemide 100 mg bid, KCl 40 mEq tid, metolazone 2.5 mg every Wednesday and Friday (take an extra 20 mEq KCl on metolazone days), Bidil 1/2 tab tid, bisoprolol 2.5 daily, spironolactone 25 daily, Eliquis 5 mg bid, Crestor 20 daily.   Length of Stay: 24  Loralie Champagne, MD  03/30/2019, 9:20 AM  Advanced Heart Failure Team Pager [phone] (M-F; [place])  Please contact [facility] for night-coverage after hours (4p -7a ) and weekends on [email or web address]

## 2019-03-30 NOTE — Progress Notes (Signed)
Occupational Therapy Treatment Patient Details Name: Nicholas Caldwell MRN: [id number] DOB: Mar 07, 1946 Today's Date: 03/30/2019    History of present illness Pt is a 73 y.o. male admitted 03/06/19 with acute on chronic heart failure and respiratory failure. Found to have severe iron deficiency anemia; now s/p small bowel enteroscopy and biopsy 5/12. Plan for DCCV 5/15. PMH includes CHF, chronic anemia, HLD, obesity, CAD, NICM, polyneuropathy, DM, COPD.   OT comments  Pt progressing towards OT goals this session, agreeable for BSC transfer-able to perform at min guard with short in room ambulation. Max A for perianal care in standing with wet wipes, returned to supine and grooming performed seated EOB. Pt continues to benefit from skilled OT, if Pt continues to decline CIR - will require HHOT.   Follow Up Recommendations  CIR;Supervision/Assistance - 24 hour    Equipment Recommendations  Other (comment)(defer to next venue of care)    Recommendations for Other Services      Precautions / Restrictions Precautions Precautions: Fall Restrictions Weight Bearing Restrictions: No       Mobility Bed Mobility Overal bed mobility: Needs Assistance Bed Mobility: Sit to Supine;Supine to Sit     Supine to sit: Min assist;HOB elevated Sit to supine: Min assist   General bed mobility comments: min A for trunk elevation, Pt reaches out for therapist  Transfers Overall transfer level: Needs assistance Equipment used: Rolling walker (2 wheeled) Transfers: Sit to/from Stand Sit to Stand: Min guard         General transfer comment: cues for hand placement only    Balance Overall balance assessment: Needs assistance   Sitting balance-Leahy Scale: Fair       Standing balance-Leahy Scale: Poor(improving toward fair) Standing balance comment: reliant on the RW for both back weakness/pain and stability, but starting to take hands off to reach for targets                            ADL either performed or assessed with clinical judgement   ADL Overall ADL's : Needs assistance/impaired     Grooming: Set up;Supervision/safety;Wash/dry hands;Wash/dry face Grooming Details (indicate cue type and reason): EOB                 Toilet Transfer: Min guard;Ambulation;BSC;RW;Requires wide/bariatric Toilet Transfer Details (indicate cue type and reason): short ambulation in room to bari Irwin and Hygiene: Maximal assistance;Sit to/from stand Toileting - Clothing Manipulation Details (indicate cue type and reason): Pt able to stand min guard, OT did peri care with wet wipes     Functional mobility during ADLs: Min guard;Rolling walker General ADL Comments: decreased activity tolerance continues, but Pt motivated to continue to progress     Vision       Perception     Praxis      Cognition Arousal/Alertness: Awake/alert Behavior During Therapy: WFL for tasks assessed/performed Overall Cognitive Status: Within Functional Limits for tasks assessed                                          Exercises     Shoulder Instructions       General Comments VSS    Pertinent Vitals/ Pain       Pain Assessment: Faces Faces Pain Scale: Hurts little more Pain Location: back Pain Descriptors / Indicators: Discomfort;Sore Pain Intervention(s): Monitored  during session;Repositioned  Home Living                                          Prior Functioning/Environment              Frequency  Min 2X/week        Progress Toward Goals  OT Goals(current goals can now be found in the care plan section)  Progress towards OT goals: Progressing toward goals  Acute Rehab OT Goals Patient Stated Goal: to go home OT Goal Formulation: With patient Time For Goal Achievement: 04/05/19  Plan Discharge plan remains appropriate;Frequency remains appropriate    Co-evaluation                  AM-PAC OT "6 Clicks" Daily Activity     Outcome Measure   Help from another person eating meals?: A Little Help from another person taking care of personal grooming?: A Little Help from another person toileting, which includes using toliet, bedpan, or urinal?: A Lot Help from another person bathing (including washing, rinsing, drying)?: A Lot Help from another person to put on and taking off regular upper body clothing?: A Lot Help from another person to put on and taking off regular lower body clothing?: Total 6 Click Score: 13    End of Session Equipment Utilized During Treatment: Gait belt;Rolling walker;Oxygen  OT Visit Diagnosis: Unsteadiness on feet (R26.81);Other abnormalities of gait and mobility (R26.89);Muscle weakness (generalized) (M62.81)   Activity Tolerance Patient tolerated treatment well   Patient Left in bed;with call bell/phone within reach;Other (comment)(all 4 rails up by Pt request)   Nurse Communication          Time: [phone] OT Time Calculation (min): 23 min  Charges: OT General Charges $OT Visit: 1 Visit OT Treatments $Self Care/Home Management : 23-37 mins  Hulda Humphrey OTR/L Acute Rehabilitation Services Pager: [phone] Office: [place] 03/30/2019, 5:07 PM

## 2019-03-30 NOTE — Progress Notes (Addendum)
PROGRESS NOTE  Nicholas Caldwell MHD:[id number] DOB: [birth year] DOA: 03/06/2019 PCP: [facility]  HPI/Recap of past 24 hours: 73 y.o.Mwith CAD, isch CM EF 40%, sdCHF, pAF on Xarelto, pHTN, MO, OSA not compliant with CPAP, COPD on home O2 3L, and smoking who presented with swelling and dyspnea.  Patient had had slow progressive dyspnea, SOB with exertion, also significant peripheral edema. In ER, BNP >1000, CXR with edema, HR 124 in Afib, and new anemia Hgb 6.7 g/dL. Transfused and started on Lasix.  Heart failure team consulted and followed.  03/30/19: Patient seen and examined at his bedside.  He denies chest pain or dyspnea while laying in bed.  States he wants to go home.  PT recommended CIR but he declines.  Assessment/Plan: Principal Problem:   Acute on chronic respiratory failure with hypoxia and hypercapnia (HCC) Active Problems:   Essential hypertension   Type 2 diabetes mellitus with neuropathy   PAF (paroxysmal atrial fibrillation) (HCC)   COPD (chronic obstructive pulmonary disease) (HCC)   Medically noncompliant   Atrial fibrillation with RVR (HCC)   AKI (acute kidney injury) ([place])   Acute on chronic anemia   Acute on chronic systolic (congestive) heart failure (HCC)   Acute on chronic systolic CHF (congestive heart failure) (HCC)   Iron deficiency anemia   NSVT (nonsustained ventricular tachycardia) (HCC)   Tobacco abuse counseling   Gastritis and gastroduodenitis   Cerebral embolism with cerebral infarction  Acute versus subacute ischemic stroke, likely cardioembolic Overnight, patient was apparently confused and nonverbal for a few seconds. Patient states he was angry at nurses and was just not communicating. Rapid response was initiated with a CT head notable for small bilateral cerebellar infarcts, question chronic versus acute. EEG with no seizure activity. MR brain notable for small group of left frontal acute versus early subacute strokes in the  setting of a flutter/atrial fibrillation. --Neurology had been following initially --Lipid panel 5/2 w/ TC 83, LDL 18, HDL 43, TG 109 --HbA1c 6.07 Nov 2018 --Bilateral carotid Dopplers with no significant findings --continue crestor as tolerated --Continue anticoagulation with Eliquis. No sign of acute bleeding --continue to monitor on telemetry --Continued on home gabapentin as tolerated - Currently stable  HCAP, unclear if present on admission Independently reviewed latest chest x-ray which revealed bibasilar infiltrates Last fever was on 03/28/2019 Was started on IV cefepime empirically, continue Currently afebrile with no leukocytosis Switch to cefdinir twice daily x5 days  Acute on chronic hypoxic respiratory failure: resolved Patient presenting from home with progressive dyspnea associated with worsening lower extremity edema. Likely multifactorial from acute CHF exacerbation and COPD flare, as well as anemia, NSVT/AF. Chest x-ray on admission notable for bilateral interstitial and alveolar airspace opacities with cardiomegaly. --Cont to wean O2 as tolerated --Cont to wean O2. Currently on 4LNC -Maintain O2 saturation greater than 90% -Continue nebs  Acute on chronic systolic and diastolic CHF EF 21-19% on TEE in Mar.  --Cardiology/HF team following, appreciate assistance --Transitionedmetolazone, Acetazolamide and Lasix gtt to torsemide 2m PO BID on 5/18by cards due to increase in creatinine --Had required lasix gtt this admit, now transitioned to PO torsemide --ContinueCrestor --ContinueBB as tolerated --Net I&O -19.1 L since admission however weights about the same -DC meds as recommended by cardiology: torsemide 100 mg bid, KCl 40 mEq tid, metolazone 2.5 mg every Wednesday and Friday (take an extra 20 mEq KCl on metolazone days), Bidil 1/2 tab tid, bisoprolol 2.5 daily, spironolactone 25 daily, Eliquis 5 mg bid, Crestor 20 daily.  Hypokalemia Likely secondary  to diuresis  Replete as indicated  Paroxysmal atrial fibrillation/atypical aflutter --Continues with p.A. fib/flutter on telemetry. Cardiology following, appreciate assistance. Has been discussed with EP, not a optimal ablation candidate, may consider in the future. --Initially started on heparin drip on 03/15/2019;transitioned to Eliquis 5/14 --Hgb stable, 8.9  --continue beta-blocker --Continue to monitor on telemetry - Currently rate controlled  Acute kidney injury Creatinine baseline 1.2 - 1.6 for the last 6 months roughly, trending up from 0.9 last few years. AKI again this hospitalization, has been increasing last few days with diuresis. -Cr stable at 1.99 from 2.13 -Repeat BMP in the morning  Iron deficiency anemia Acute blood loss anemia Upper GI bleed Suspected chronic GI blood lossfrom gastric ulcer. Received 2 units PRBCs during the hospitalization. EGD 5/12 with multiple nonbleeding erosions, one nonbleeding superficial gastric ulcer with clean ulcer base --Pathology from stomach biopsy negative for metaplasia, dysplasia or malignancy, no H. pylori --s/p Feraheme 5/4,5/12 --Continue protonix 80m BID x 12 weeks --Minimize NSAID use --Continue to monitor daily CBC, especially now on Eliquis --GI recommends outpatient follow-up and consideration of gastric mapping in the future as an outpatient; will need follow-up with Dr. CBryan Lemma --Without acute bleeding at this time  Acute COPD exacerbation Patient with home O2 dependence at 3 L per nasal cannula. --completed course of steroids and doxycycline --Pulmicort/Brovana --Continue nebs prn --Chest PT, Mucinex, Mucomyst prn --Continue to titrate supplemental oxygen to maintain SPO2 greater than 88% - Presently on 4LNC, cont to wean as tolerated  Positive blood culture --CoNS in 1/2. Likely contaminant.  Hypokalemia -K3.1today --Potassium replacement ordered this AM --repeat bmet in AM  Diabetes  Hemoglobin A1c 6.5 on 11/13/2018. Home regimen includes metformin 1000 mg p.o. twice daily, NPH 70-30 5u BID. Glucose poorly controlled during hospitalization, likely exacerbated by need of IV steroids for COPD exacerbation as above. Also with likely dietary noncompliance. --NPH continued on 20uSQ BID --Novologcontinued on 8uTIDAC --Cont on SSI coverage --Glucose trends reviewed and remain stable  Hypertension Coronary disease secondary prevention -Continuewith Crestor, BB as patient tolerates  Paroxysmal atrial fibrillation NSVT -CHA2DS2-Vasc =4 (Age, CHF, PVD, DM). -continue anticoagulation with Eliquis -Continue with beta blocker as tolerated  OHS/OSA/MO --Historically noncompliant with CPAP at home per report, but has been compliant while inpatient --Continue to encourage compliance with CPAP nocturnally  Chronic pain -Chief complaint seems to be primarily pain medications -NCCSR reviewed and med rec reviewed. Pt on 174mpercocet q6hrs prior to admit.  -Cont on 1062mercocet dosing, seems to be tolerating currently.  Constipation -Patient reports good results with lactulose -Continue with cathartics as needed -stable at this time  Sepsis with PNA, not present on admit -Pt with temp of 101F with RR in the upper 20's in the AM of 5/25 -Recent CXR with findings suggestive of possible PNA -Empirically started cefepime and vanc -UA is suggestive of UTI -Follow up urine culture -Blood cx thus far neg -MRSA screen was neg, thus will d/c Vanc  Physical debility/ambulatory dysfunction PT recommend CIR initially but patient declines Now recommendations for home health PT Continue physical therapy Fall precautions   DVT prophylaxis:  Eliquis Code Status: Full Family Communication: Pt in room, family not at bedside Disposition Plan: Possible d/c tomorrow 03/31/2019: Patient declines CIR.  Consultants:   Cardiology  CIR  GI    Objective: Vitals:    03/30/19 0335 03/30/19 0604 03/30/19 0717 03/30/19 1046  BP: (!) 107/48  110/62   Pulse: 68   (!) 58  Resp: (!) 24     Temp: 98.8 F (37.1 C)  98.8 F (37.1 C) 98.9 F (37.2 C)  TempSrc: Axillary  Oral Oral  SpO2: 96%     Weight:  122.5 kg    Height:        Intake/Output Summary (Last 24 hours) at 03/30/2019 1226 Last data filed at 03/30/2019 1220 Gross per 24 hour  Intake 1038 ml  Output 3250 ml  Net -2212 ml   Filed Weights   03/28/19 0414 03/29/19 0656 03/30/19 0604  Weight: 120.7 kg 121.2 kg 122.5 kg    Exam:  . General: 73 y.o. year-old male well developed well nourished in no acute distress.  Alert and oriented x3. . Cardiovascular: Regular rate and rhythm with no rubs or gallops.  No thyromegaly or JVD noted.   Marland Kitchen Respiratory: Mild rales at bases with no wheezes noted.  Good inspiratory effort.   . Abdomen: Soft nontender nondistended with normal bowel sounds x4 quadrants. . Musculoskeletal: 1+ pitting edema lower extremity bilaterally. 2/4 pulses in all 4 extremities. Marland Kitchen Psychiatry: Mood is appropriate for condition and setting   Data Reviewed: CBC: Recent Labs  Lab 03/26/19 0450 03/27/19 0415 03/28/19 0400 03/29/19 0422 03/30/19 0500  WBC 6.3 6.5 10.6* 8.8 8.3  NEUTROABS 4.5 4.9 8.5* 7.0 6.3  HGB 8.5* 9.0* 8.9* 8.4* 8.6*  HCT 28.5* 30.6* 29.9* 28.7* 29.1*  MCV 86.9 86.9 86.9 87.0 86.4  PLT 406* 413* 372 323 803   Basic Metabolic Panel: Recent Labs  Lab 03/26/19 0450 03/27/19 0415 03/27/19 0514 03/28/19 0400 03/29/19 0422 03/30/19 0500  NA 139 139  --  136 138 139  K 3.0* 3.1*  --  3.4* 3.1* 3.0*  CL 88* 86*  --  84* 90* 87*  CO2 39* 39*  --  42* 38* 39*  GLUCOSE 183* 185*  --  279* 165* 133*  BUN 88* 84*  --  87* 91* 91*  CREATININE 1.90* 1.78*  --  2.15* 2.13* 1.99*  CALCIUM 8.6* 8.7*  --  8.5* 8.3* 8.4*  MG  --   --  2.5* 2.2 2.3 2.1   GFR: Estimated Creatinine Clearance: 46.7 mL/min (A) (by C-G formula based on SCr of 1.99 mg/dL (H)).  Liver Function Tests: No results for input(s): AST, ALT, ALKPHOS, BILITOT, PROT, ALBUMIN in the last 168 hours. No results for input(s): LIPASE, AMYLASE in the last 168 hours. No results for input(s): AMMONIA in the last 168 hours. Coagulation Profile: No results for input(s): INR, PROTIME in the last 168 hours. Cardiac Enzymes: No results for input(s): CKTOTAL, CKMB, CKMBINDEX, TROPONINI in the last 168 hours. BNP (last 3 results) No results for input(s): PROBNP in the last 8760 hours. HbA1C: No results for input(s): HGBA1C in the last 72 hours. CBG: Recent Labs  Lab 03/29/19 1042 03/29/19 1555 03/29/19 2138 03/30/19 0603 03/30/19 1127  GLUCAP 166* 164* 167* 123* 193*   Lipid Profile: No results for input(s): CHOL, HDL, LDLCALC, TRIG, CHOLHDL, LDLDIRECT in the last 72 hours. Thyroid Function Tests: No results for input(s): TSH, T4TOTAL, FREET4, T3FREE, THYROIDAB in the last 72 hours. Anemia Panel: No results for input(s): VITAMINB12, FOLATE, FERRITIN, TIBC, IRON, RETICCTPCT in the last 72 hours. Urine analysis:    Component Value Date/Time   COLORURINE YELLOW 03/28/2019 [place] 03/28/2019 1245   LABSPEC 1.009 03/28/2019 1245   PHURINE 7.0 03/28/2019 [place] 03/28/2019 1245   [place] 03/28/2019 [place]  NEGATIVE 03/28/2019 [place] 03/28/2019 1245   [place] 03/28/2019 1245   UROBILINOGEN 0.2 10/26/2014 2206   NITRITE POSITIVE (A) 03/28/2019 1245   LEUKOCYTESUR MODERATE (A) 03/28/2019 1245   Sepsis Labs: _0 (procalcitonin:4,lacticidven:4)  ) Recent Results (from the past 240 hour(s))  Culture, blood (routine x 2) Call MD if unable to obtain prior to antibiotics being given     Status: None (Preliminary result)   Collection Time: 03/28/19  9:40 AM  Result Value Ref Range Status   Specimen Description BLOOD LEFT HAND  Final   Special Requests   Final    BOTTLES DRAWN AEROBIC ONLY  Blood Culture adequate volume   Culture   Final    NO GROWTH 2 DAYS Performed at [facility], [place] [address] [place], [place] [address]    Report Status PENDING  Incomplete  Culture, blood (routine x 2) Call MD if unable to obtain prior to antibiotics being given     Status: None (Preliminary result)   Collection Time: 03/28/19  9:40 AM  Result Value Ref Range Status   Specimen Description BLOOD LEFT HAND  Final   Special Requests   Final    BOTTLES DRAWN AEROBIC ONLY Blood Culture adequate volume   Culture   Final    NO GROWTH 2 DAYS Performed at [facility], [address] [place], [place] [address]    Report Status PENDING  Incomplete  Culture, Urine     Status: Abnormal (Preliminary result)   Collection Time: 03/28/19  1:54 PM  Result Value Ref Range Status   Specimen Description URINE, RANDOM  Final   Special Requests   Final    NONE Performed at [facility], [place] [address], [place], [place] [address]    Culture >=100,000 COLONIES/mL CITROBACTER FREUNDII (A)  Final   Report Status PENDING  Incomplete  MRSA PCR Screening     Status: None   Collection Time: 03/29/19  4:10 AM  Result Value Ref Range Status   MRSA by PCR NEGATIVE NEGATIVE Final    Comment:        The GeneXpert MRSA Assay (FDA approved for NASAL specimens only), is one component of a comprehensive MRSA colonization surveillance program. It is not intended to diagnose MRSA infection nor to guide or monitor treatment for MRSA infections. Performed at [facility], [place] [address], [place], [place] [address]   Culture, sputum-assessment     Status: None   Collection Time: 03/29/19  9:19 AM  Result Value Ref Range Status   Specimen Description SPUTUM  Final   Special Requests NONE  Final   Sputum evaluation   Final    Sputum specimen not acceptable for testing.  Please recollect.   Gram Stain Report Called to,Read Back By and Verified With: Caprice Red RN, AT 1121 03/29/19 BY Rush Landmark Performed at [facility], [place] [address], [place],  [address]    Report Status 03/29/2019 FINAL  Final      Studies: No results found.  Scheduled Meds: . apixaban  5 mg Oral BID  . arformoterol  15 mcg Nebulization BID  . bisoprolol  2.5 mg Oral Daily  . budesonide (PULMICORT) nebulizer solution  0.5 mg Nebulization BID  . ferrous sulfate  325 mg Oral BID WC  . folic acid  1 mg Oral Daily  . gabapentin  600 mg Oral BID  . Gerhardt's butt cream   Topical  Daily  . guaiFENesin  400 mg Oral BID  . hydrocortisone  25 mg Rectal BID  . insulin aspart  0-15 Units Subcutaneous TID WC  . insulin aspart  0-5 Units Subcutaneous QHS  . insulin aspart  8 Units Subcutaneous TID WC  . insulin NPH Human  20 Units Subcutaneous BID AC & HS  . ipratropium-albuterol  3 mL Nebulization TID  . isosorbide-hydrALAZINE  0.5 tablet Oral TID  . latanoprost  1 drop Both Eyes QHS  . liver oil-zinc oxide   Topical BID  . pantoprazole  40 mg Oral BID  . polyethylene glycol  17 g Oral Daily  . potassium chloride  40 mEq Oral TID  . rosuvastatin  20 mg Oral q1800  . spironolactone  25 mg Oral Daily  . torsemide  100 mg Oral BID    Continuous Infusions: . ceFEPime (MAXIPIME) IV 2 g (03/30/19 1004)     LOS: 24 days     Kayleen Memos, MD [facility] Pager [phone]  If 7PM-7AM, please contact night-coverage [email or web address] Password [facility] 03/30/2019, 12:26 PM

## 2019-03-30 NOTE — Plan of Care (Signed)
  Problem: Education: Goal: Knowledge of General Education information will improve Description Including pain rating scale, medication(s)/side effects and non-pharmacologic comfort measures Outcome: Progressing   Problem: Health Behavior/Discharge Planning: Goal: Ability to manage health-related needs will improve Outcome: Progressing   Problem: Clinical Measurements: Goal: Ability to maintain clinical measurements within normal limits will improve Outcome: Progressing Goal: Diagnostic test results will improve Outcome: Progressing Goal: Respiratory complications will improve Outcome: Progressing Goal: Cardiovascular complication will be avoided Outcome: Progressing   Problem: Activity: Goal: Risk for activity intolerance will decrease Outcome: Progressing   Problem: Coping: Goal: Level of anxiety will decrease Outcome: Progressing   Problem: Elimination: Goal: Will not experience complications related to bowel motility Outcome: Progressing   Problem: Pain Managment: Goal: General experience of comfort will improve Outcome: Progressing   Problem: Safety: Goal: Ability to remain free from injury will improve Outcome: Progressing   Problem: Skin Integrity: Goal: Risk for impaired skin integrity will decrease Outcome: Progressing   Problem: Education: Goal: Knowledge of disease or condition will improve Outcome: Progressing   Problem: Coping: Goal: Will verbalize positive feelings about self Outcome: Progressing Goal: Will identify appropriate support needs Outcome: Progressing   Problem: Self-Care: Goal: Ability to participate in self-care as condition permits will improve Outcome: Progressing Goal: Ability to communicate needs accurately will improve Outcome: Progressing

## 2019-03-31 ENCOUNTER — Telehealth (HOSPITAL_COMMUNITY): Payer: Self-pay

## 2019-03-31 ENCOUNTER — Telehealth: Payer: Self-pay

## 2019-03-31 LAB — GLUCOSE, CAPILLARY
Glucose-Capillary: 89 mg/dL (ref 70–99)
Glucose-Capillary: 157 mg/dL — ABNORMAL HIGH (ref 70–99)

## 2019-03-31 LAB — BASIC METABOLIC PANEL
Anion gap: 13 (ref 5–15)
BUN: 95 mg/dL — ABNORMAL HIGH (ref 8–23)
CO2: 38 mmol/L — ABNORMAL HIGH (ref 22–32)
Calcium: 8.6 mg/dL — ABNORMAL LOW (ref 8.9–10.3)
Chloride: 90 mmol/L — ABNORMAL LOW (ref 98–111)
Creatinine, Ser: 1.99 mg/dL — ABNORMAL HIGH (ref 0.61–1.24)
GFR calc Af Amer: 38 mL/min — ABNORMAL LOW (ref >60)
GFR calc non Af Amer: 33 mL/min — ABNORMAL LOW (ref >60)
Glucose, Bld: 95 mg/dL (ref 70–99)
Potassium: 3.3 mmol/L — ABNORMAL LOW (ref 3.5–5.1)
Sodium: 141 mmol/L (ref 135–145)

## 2019-03-31 LAB — URINE CULTURE: Culture: >=100000 — AB

## 2019-03-31 LAB — MAGNESIUM: Magnesium: 2.3 mg/dL (ref 1.7–2.4)

## 2019-03-31 MED ORDER — FERROUS SULFATE 325 (65 FE) MG PO TABS: 325.0000 mg | ORAL_TABLET | Freq: Two times a day (BID) | ORAL | 0 refills | Status: DC

## 2019-03-31 MED ORDER — POTASSIUM CHLORIDE CRYS ER 20 MEQ PO TBCR: 40.0000 meq | EXTENDED_RELEASE_TABLET | Freq: Once | ORAL | Status: DC

## 2019-03-31 MED ORDER — ROSUVASTATIN CALCIUM 20 MG PO TABS: 20.0000 mg | ORAL_TABLET | Freq: Every day | ORAL | 0 refills | Status: DC

## 2019-03-31 MED ORDER — POTASSIUM CHLORIDE ER 20 MEQ PO TBCR: 40.0000 meq | EXTENDED_RELEASE_TABLET | Freq: Three times a day (TID) | ORAL | 0 refills | Status: DC

## 2019-03-31 MED ORDER — INSULIN ASPART PROT & ASPART (70-30 MIX) 100 UNIT/ML PEN: 10.0000 [IU] | PEN_INJECTOR | Freq: Two times a day (BID) | SUBCUTANEOUS | 0 refills | Status: DC

## 2019-03-31 MED ORDER — POTASSIUM CHLORIDE CRYS ER 20 MEQ PO TBCR
40.0000 meq | EXTENDED_RELEASE_TABLET | Freq: Three times a day (TID) | ORAL | Status: DC
  Administered 2019-03-31 (×2): 40 meq via ORAL
  Filled 2019-03-31 (×2): qty 2

## 2019-03-31 MED ORDER — METOLAZONE 2.5 MG PO TABS: 2.5000 mg | ORAL_TABLET | ORAL | 0 refills | Status: DC

## 2019-03-31 MED ORDER — MAGNESIUM OXIDE 400 MG PO TABS: 400.0000 mg | ORAL_TABLET | Freq: Every day | ORAL | 0 refills | Status: DC

## 2019-03-31 MED ORDER — CEFDINIR 300 MG PO CAPS: 300.0000 mg | ORAL_CAPSULE | Freq: Two times a day (BID) | ORAL | 0 refills | Status: AC

## 2019-03-31 MED ORDER — INSULIN ASPART 100 UNIT/ML ~~LOC~~ SOLN: 4.0000 [IU] | Freq: Three times a day (TID) | SUBCUTANEOUS | 0 refills | Status: DC

## 2019-03-31 MED ORDER — APIXABAN 5 MG PO TABS: 5.0000 mg | ORAL_TABLET | Freq: Two times a day (BID) | ORAL | 0 refills | Status: DC

## 2019-03-31 MED ORDER — SPIRONOLACTONE 25 MG PO TABS: 25.0000 mg | ORAL_TABLET | Freq: Every day | ORAL | 0 refills | Status: DC

## 2019-03-31 MED ORDER — BISOPROLOL FUMARATE 5 MG PO TABS: 2.5000 mg | ORAL_TABLET | Freq: Every day | ORAL | 0 refills | Status: DC

## 2019-03-31 MED ORDER — TORSEMIDE 100 MG PO TABS: 100.0000 mg | ORAL_TABLET | Freq: Two times a day (BID) | ORAL | 0 refills | Status: DC

## 2019-03-31 MED ORDER — ISOSORB DINITRATE-HYDRALAZINE 20-37.5 MG PO TABS: 0.5000 | ORAL_TABLET | Freq: Three times a day (TID) | ORAL | 0 refills | Status: DC

## 2019-03-31 MED ORDER — CEFDINIR 300 MG PO CAPS: 300.0000 mg | ORAL_CAPSULE | Freq: Two times a day (BID) | ORAL | 0 refills | Status: DC

## 2019-03-31 MED FILL — FERROUS SULFATE 325 MG TAB: 325 (65 FE) | 30 days supply | Qty: 60 | Fill #0

## 2019-03-31 MED FILL — BIDIL TABLET: 20-37.5 | 6 days supply | Qty: 10 | Fill #0

## 2019-03-31 MED FILL — SPIRONOLACTONE 25 MG TABLET: 25 | 30 days supply | Qty: 30 | Fill #0

## 2019-03-31 MED FILL — POTASSIUM CL ER 20 MEQ TABL: 20 | 14 days supply | Qty: 42 | Fill #0

## 2019-03-31 MED FILL — CEFDINIR 300 MG CAPSULE: 300 | 5 days supply | Qty: 10 | Fill #0

## 2019-03-31 MED FILL — ROSUVASTATIN CALCIUM 20 MG: 20 | 30 days supply | Qty: 30 | Fill #0

## 2019-03-31 MED FILL — ELIQUIS 5 MG TABLET: 5 | 30 days supply | Qty: 60 | Fill #0

## 2019-03-31 MED FILL — TORSEMIDE 100 MG TABLET: 100 | 10 days supply | Qty: 20 | Fill #0

## 2019-03-31 MED FILL — BISOPROLOL FUMARATE 5 MG TA: 5 | 30 days supply | Qty: 15 | Fill #0

## 2019-03-31 NOTE — Progress Notes (Signed)
Removed PICC line by IV team and removed PIV access x 1. Pt received discharge instructions. Removed Foley cath and voided. Pt received how to mesure insulin and patient stated he can give insulin himself at home.  Patient didn't have novolog meal time insulin. However, he will get next week at the [facility]. Will call pt's wife regarding this matter again and explain it. HS [employer]

## 2019-03-31 NOTE — Discharge Summary (Addendum)
Discharge Summary  Nicholas Caldwell LFY:[id number] DOB: [birth year]  PCP: [facility]  Admit date: 03/06/2019 Discharge date: 03/31/2019  Time spent: 35 minutes  Recommendations for Outpatient Follow-up:  1. Follow-up with cardiology/heart failure team 2. Follow-up with neurology 3. Follow-up with GI 4. Follow-up with your PCP within a week 5. Continue physical therapy 6. Take your medications as prescribed 7. Fall precautions  Discharge Diagnoses:  Active Hospital Problems   Diagnosis Date Noted   Acute on chronic respiratory failure with hypoxia and hypercapnia (HCC) 11/13/2018   Cerebral embolism with cerebral infarction 03/21/2019   Gastritis and gastroduodenitis    NSVT (nonsustained ventricular tachycardia) ([place]) 03/08/2019   Tobacco abuse counseling 03/08/2019   Iron deficiency anemia    Acute on chronic systolic CHF (congestive heart failure) ([place]) 03/06/2019   Acute on chronic systolic (congestive) heart failure (HCC)    Acute on chronic anemia    AKI (acute kidney injury) ([place])    Atrial fibrillation with RVR (HCC)    Medically noncompliant    COPD (chronic obstructive pulmonary disease) ([place]) 07/16/2016   PAF (paroxysmal atrial fibrillation) ([place])    Type 2 diabetes mellitus with neuropathy 10/31/2014   Essential hypertension 10/31/2014    Resolved Hospital Problems  No resolved problems to display.    Discharge Condition: Stable  Diet recommendation: Low-sodium heart healthy carb modified diet  Vitals:   03/31/19 0757 03/31/19 1101  BP: (!) 89/65 (!) 97/57  Pulse:    Resp:    Temp: 98.4 F (36.9 C) 98.2 F (36.8 C)  SpO2:      History of present illness:   73 y.o.Mwith CAD, isch CM EF 40%, sdCHF, pAF on Xarelto, pHTN, MO, OSA not compliant with CPAP, COPD on home O2 3L, and smoking who presented with swelling and dyspnea.  Patient had had slow progressive dyspnea, SOB with exertion, also significant peripheral edema. In  ER, BNP >1000, CXR with edema, HR 124 in Afib, and new anemia Hgb 6.7 g/dL. Transfused and started on diuretics.  Heart failure team consulted and followed.  Continued diuresing with adjustment of cardiac medications.  Hospital course complicated by ischemic stroke, generalized weakness with PT OT recommendations for CIR.  Patient declines and wants to go home.  Sepsis secondary to HCAP and UTI which are improving on antibiotics.  03/31/19: Patient seen and examined at his bedside.  No acute events overnight.  He has no new concerns and wants to go home.   Patient advised to take his medications as prescribed, to continue physical therapy, and to closely follow-up with heart failure team.  Patient understands and agrees to plan.  Hospital Course:  Principal Problem:   Acute on chronic respiratory failure with hypoxia and hypercapnia (HCC) Active Problems:   Essential hypertension   Type 2 diabetes mellitus with neuropathy   PAF (paroxysmal atrial fibrillation) (HCC)   COPD (chronic obstructive pulmonary disease) (HCC)   Medically noncompliant   Atrial fibrillation with RVR (HCC)   AKI (acute kidney injury) ([place])   Acute on chronic anemia   Acute on chronic systolic (congestive) heart failure (HCC)   Acute on chronic systolic CHF (congestive heart failure) (HCC)   Iron deficiency anemia   NSVT (nonsustained ventricular tachycardia) (HCC)   Tobacco abuse counseling   Gastritis and gastroduodenitis   Cerebral embolism with cerebral infarction  Acute versus subacute ischemic stroke, likely cardioembolic Overnight, patient was apparently confused and nonverbal for a few seconds. Patient states he was angry at nurses and was  just not communicating. Rapid response was initiated with a CT head notable for small bilateral cerebellar infarcts, question chronic versus acute. EEG with no seizure activity. MR brain notable for small group of left frontal acute versus early subacute strokes in the  setting of a flutter/atrial fibrillation. --Neurology had been following initially --Lipid panel 5/2 w/ TC 83, LDL 18, HDL 43, TG 109 --HbA1c 6.07 Nov 2018 --Bilateral carotid Dopplers with no significant findings --continue crestor as tolerated --Continue anticoagulation with Eliquis. No sign of acute bleeding --continue to monitor on telemetry --Continued on home gabapentin as tolerated -Currently stable -Follow-up with neurology outpatient  Improving HCAP, unclear if present on admission Independently reviewed latest chest x-ray which revealed bibasilar infiltrates Last fever was on 03/28/2019 Was started on IV cefepime empirically Currently afebrile with no leukocytosis Continue Cefdinir twice daily x5 days  Citrobacter freundii UTI Urine culture from 03/28/2019 showed greater than 100,000 colonies of Citrobacter freundii Completed 3 days of IV cefepime and 2 days of oral cefdinir Continue cefdinir outpatient x5 days Sensitivities pending; patient wants to go home and agrees to follow-up with PCP.  Acute on chronic hypoxic respiratory failure: resolved Self-reported on 3 L of oxygen by nasal cannula continuously at home Patient presenting from home with progressive dyspnea associated with worsening lower extremity edema. Likely multifactorial from acute CHF exacerbation and COPD flare, as well as anemia, NSVT/AF. Chest x-ray on admission notable for bilateral interstitial and alveolar airspace opacities with cardiomegaly. --Cont to wean O2 as tolerated --Cont to wean O2. Currently on 4LNC -Maintain O2 saturation greater than 90% -Continue nebs  Acute on chronic systolic and diastolic CHF EF 85-02% on TEE in Mar.  --Cardiology/HF team following, appreciate assistance --Transitionedmetolazone, Acetazolamide and Lasix gtt to torsemide 37m PO BID on 5/18by cards due to increase in creatinine --Had required lasix gtt this admit, now transitioned to PO  torsemide --ContinueCrestor --ContinueBB as tolerated --Net I&O -19.1 L since admission however weights about the same -DC meds as recommended by cardiology: torsemide 100 mg bid, KCl 40 mEq tid, metolazone 2.5 mg every Wednesday and Friday (take an extra 20 mEq KCl on metolazone days), Bidil 1/2 tab tid, bisoprolol 2.5 daily, spironolactone 25 daily, Eliquis 5 mg bid, Crestor 20 daily.   Hypokalemia, improving Likely secondary to diuresis  Replete as indicated Continue KCl supplement as recommended by heart failure team  Hypomagnesemia, resolved post repletion Magnesium 2.3 on 03/31/19 Holding magnesium supplement for now Repeat level at your next appointment with PCP within a week  Paroxysmal atrial fibrillation/atypical aflutter --Continues with p.A. fib/flutter on telemetry. Cardiology following, appreciate assistance. Has been discussed with EP, not a optimal ablation candidate, may consider in the future. --Initially started on heparin drip on 03/15/2019;transitioned to Eliquis 5/14 --Hgb stable, 8.6 from 8.4 on 03/29/2019 --continue beta-blocker --Continue to monitor on telemetry - Currently rate controlled -Follow-up with cardiology  Acute kidney injury -Creatinine baseline with GFR greater than 60  -Cr stable at 1.99 from 2.13 -Follow-up with your primary care provider  Iron deficiency anemia Acute blood loss anemia Upper GI bleed Suspected chronic GI blood lossfrom gastric ulcer. Received 2 units PRBCs during the hospitalization. EGD 5/12 with multiple nonbleeding erosions, one nonbleeding superficial gastric ulcer with clean ulcer base --Pathology from stomach biopsy negative for metaplasia, dysplasia or malignancy, no H. pylori --s/p Feraheme 5/4,5/12 --Continue protonix 433mBID x 12 weeks --Minimize NSAID use --Continue to monitor daily CBC, especially now on Eliquis --GI recommends outpatient follow-up and consideration of gastric mapping  in the future as  an outpatient; will need follow-up with Dr. Bryan Lemma. --Without acute bleedingat this time -Follow-up with GI  Acute COPD exacerbation Patient with home O2 dependence at 3 L per nasal cannula continuously. --completed course of steroids and doxycycline --Pulmicort/Brovana --Continue nebs prn --Chest PT, Mucinex, Mucomyst prn --Continue to titrate supplemental oxygen to maintain SPO2 greater than 90%  Positive blood culture --CoNS in 1/2. Likely contaminant.  Diabetes Hemoglobin A1c 6.5 on 11/13/2018. Home regimen includes metformin 1000 mg p.o. twice daily, NPH 70-30 5u BID. Glucose poorly controlled during hospitalization, likely exacerbated by need of IV steroids for COPD exacerbation as above. Also with likely dietary noncompliance. --NPH continued on 20uSQ BID --Novologcontinued on 8uTIDAC --Cont on SSI coverage --Glucose trends reviewed and remain stable -For discharge NPH 70-30 10 unit twice daily and NovoLog 4 units 3 times daily -Dose reduced to avoid hypoglycemia in the setting of AKI   Hypertension Blood pressure soft however maintaining map greater than 65 Follow-up with cardiology  Coronary artery disease  -Continuewith Crestor, BB as patient tolerates  Paroxysmal atrial fibrillation NSVT -CHA2DS2-Vasc =4 (Age, CHF, PVD, DM). -continue anticoagulation with Eliquis -Continue with beta blocker as tolerated  OHS/OSA/MO --Historically noncompliant with CPAP at home per report, but has been compliant while inpatient --Continue to encourage compliance with CPAP nocturnally  Chronic pain -Stable  Constipation -Patient reports good results with lactulose -Continue with cathartics as needed -stable at this time  Physical debility/ambulatory dysfunction PT recommend CIR initially but patient declines Now recommendations for home health PT Continue physical therapy Fall precautions   DVT prophylaxis: Eliquis Code Status:Full Family  Communication:Pt in room, family not at bedside Disposition Plan:Possible d/c tomorrow 03/31/2019: Patient declines CIR.  Consultants:  Cardiology  CIR  GI     Discharge Exam: BP (!) 97/57 (BP Location: Left Arm)    Pulse 72    Temp 98.2 F (36.8 C) (Oral)    Resp (!) 23    Ht 6' (1.829 m)    Wt 122.5 kg    SpO2 95%    BMI 36.63 kg/m   General: 73 y.o. year-old male well developed well nourished in no acute distress.  Alert and oriented x3.  Cardiovascular: Regular rate and rhythm with no rubs or gallops.  No thyromegaly or JVD noted.    Respiratory: Clear to auscultation with no wheezes or rales. Good inspiratory effort.  Abdomen: Obese with normal bowel sounds x4 quadrants.  Musculoskeletal: 1+ pitting edema lower extremities bilaterally. 2/4 pulses in all 4 extremities.  Psychiatry: Mood is appropriate for condition and setting  Discharge Instructions You were cared for by a hospitalist during your hospital stay. If you have any questions about your discharge medications or the care you received while you were in the hospital after you are discharged, you can call the unit and asked to speak with the hospitalist on call if the hospitalist that took care of you is not available. Once you are discharged, your primary care physician will handle any further medical issues. Please note that NO REFILLS for any discharge medications will be authorized once you are discharged, as it is imperative that you return to your primary care physician (or establish a relationship with a primary care physician if you do not have one) for your aftercare needs so that they can reassess your need for medications and monitor your lab values.   Allergies as of 03/31/2019   No Known Allergies     Medication List  STOP taking these medications   acetaminophen 160 MG/5ML solution Commonly known as:  TYLENOL   diclofenac sodium 1 % Gel Commonly known as:  VOLTAREN   magnesium oxide 400  MG tablet Commonly known as:  MAG-OX   Magnesium Oxide 420 (252 Mg) MG Tabs   metFORMIN 500 MG 24 hr tablet Commonly known as:  GLUCOPHAGE-XR   multivitamin with minerals Tabs tablet   oxyCODONE-acetaminophen 10-325 MG tablet Commonly known as:  PERCOCET   potassium chloride SA 20 MEQ tablet Commonly known as:  K-DUR   rivaroxaban 20 MG Tabs tablet Commonly known as:  XARELTO     TAKE these medications   albuterol 108 (90 Base) MCG/ACT inhaler Commonly known as:  VENTOLIN HFA Inhale 2 puffs into the lungs every 6 (six) hours as needed for wheezing or shortness of breath.   apixaban 5 MG Tabs tablet Commonly known as:  ELIQUIS Take 1 tablet (5 mg total) by mouth 2 (two) times daily.   bisoprolol 5 MG tablet Commonly known as:  ZEBETA Take 0.5 tablets (2.5 mg total) by mouth daily.   budesonide-formoterol 160-4.5 MCG/ACT inhaler Commonly known as:  SYMBICORT Inhale 2 puffs into the lungs 2 (two) times daily.   cefdinir 300 MG capsule Commonly known as:  OMNICEF Take 1 capsule (300 mg total) by mouth every 12 (twelve) hours for 5 days.   diazepam 5 MG tablet Commonly known as:  VALIUM Take 5 mg by mouth daily as needed for anxiety.   ferrous sulfate 325 (65 FE) MG tablet Take 1 tablet (325 mg total) by mouth 2 (two) times daily with a meal.   fluticasone 50 MCG/ACT nasal spray Commonly known as:  FLONASE Place 2 sprays into both nostrils daily as needed for allergies.   gabapentin 600 MG tablet Commonly known as:  NEURONTIN Take 1 tablet (600 mg total) by mouth 2 (two) times daily.   guaifenesin 400 MG Tabs tablet Commonly known as:  HUMIBID E Take 1 tablet (400 mg total) by mouth every 6 (six) hours as needed. What changed:  reasons to take this   hydrocortisone 1 % lotion Apply 1 application topically See admin instructions. Apply small amount to back twice daily for itchy rash   hydrocortisone 2.5 % rectal cream Commonly known as:  ANUSOL-HC Place 1  application rectally 2 (two) times daily as needed for hemorrhoids or itching.   insulin aspart 100 UNIT/ML injection Commonly known as:  novoLOG Inject 4 Units into the skin 3 (three) times daily with meals.   insulin aspart protamine - aspart (70-30) 100 UNIT/ML FlexPen Commonly known as:  NovoLOG Mix 70/30 FlexPen Inject 0.1 mLs (10 Units total) into the skin 2 (two) times daily. What changed:  how much to take   ipratropium-albuterol 0.5-2.5 (3) MG/3ML Soln Commonly known as:  DUONEB Take 3 mLs by nebulization 2 (two) times daily as needed (shortness of breath/wheezing).   isosorbide-hydrALAZINE 20-37.5 MG tablet Commonly known as:  BIDIL Take 0.5 tablets by mouth 3 (three) times daily. What changed:  how much to take   latanoprost 0.005 % ophthalmic solution Commonly known as:  XALATAN Place 1 drop into both eyes at bedtime.   metolazone 2.5 MG tablet Commonly known as:  ZAROXOLYN Take 1 tablet (2.5 mg total) by mouth every Wednesday. Take extra Potassium 95mq when taking this medication  **Please also take Metolazone 2.5 mg Every Friday and Take extra Potassium 29m when taking this medication.** Start taking on:  April 06, 2019  What changed:  additional instructions   OXYGEN Inhale 3 L into the lungs continuous.   pantoprazole 40 MG tablet Commonly known as:  PROTONIX Take 1 tablet (40 mg total) by mouth 2 (two) times daily.   polyethylene glycol 17 g packet Commonly known as:  MIRALAX / GLYCOLAX Take 17 g by mouth daily. What changed:    when to take this  reasons to take this   Potassium Chloride ER 20 MEQ Tbcr Take 40 mEq by mouth 3 (three) times daily.   rosuvastatin 20 MG tablet Commonly known as:  CRESTOR Take 1 tablet (20 mg total) by mouth daily at 6 PM. What changed:    when to take this  Another medication with the same name was removed. Continue taking this medication, and follow the directions you see here.   sodium chloride 0.65 % Soln  nasal spray Commonly known as:  OCEAN Place 2 sprays into both nostrils as needed for congestion.   spironolactone 25 MG tablet Commonly known as:  ALDACTONE Take 1 tablet (25 mg total) by mouth daily.   torsemide 100 MG tablet Commonly known as:  DEMADEX Take 1 tablet (100 mg total) by mouth 2 (two) times daily. What changed:    medication strength  how much to take            Durable Medical Equipment  (From admission, onward)         Start     Ordered   03/29/19 1645  Heart failure home health orders  (Heart failure home health orders / Face to face)  Once    Comments:  Heart Failure Follow-up Care:  Verify follow-up appointments per Patient Discharge Instructions. Confirm transportation arranged. Reconcile home medications with discharge medication list. Remove discontinued medications from use. Assist patient/caregiver to manage medications using pill box. Reinforce low sodium food selection Assessments: Vital signs and oxygen saturation at each visit. Assess home environment for safety concerns, caregiver support and availability of low-sodium foods. Consult Education officer, museum, PT/OT, Dietitian, and CNA based on assessments. Perform comprehensive cardiopulmonary assessment. Notify MD for any change in condition or weight gain of 3 pounds in one day or 5 pounds in one week with symptoms. Daily Weights and Symptom Monitoring: Ensure patient has access to scales. Teach patient/caregiver to weigh daily before breakfast and after voiding using same scale and record.    Teach patient/caregiver to track weight and symptoms and when to notify Provider. Activity: Develop individualized activity plan with patient/caregiver.   Question Answer Comment  Heart Failure Follow-up Care [facility] at [phone]   Obtain the following labs Basic Metabolic Panel   Lab frequency Weekly   Fax lab results to [facility] at [phone]   Diet Low Sodium Heart  Healthy   Fluid restrictions: 1500 mL Fluid      03/29/19 1646         No Known Allergies Follow-up Information    [facility].   Specialty:  Home Health Services Why:  They will do your home health care at your home       [facility]. Schedule an appointment as soon as possible for a visit in 4 week(s).   Specialty:  Neurology Contact information: [place] Follow up.   Specialty:  Cardiology Contact information: [address] [id number] [place]  [facility], [place] Call in 1 day(s).   Why:  please call for a post hospital follow up appointment. Contact information: [place] [address] [phone]        Lavena Bullion, DO. Call in 1 day(s).   Specialty:  Gastroenterology Why:  Please call for a post hospital follow-up appointment. Contact information: [place] [address] [phone]            The results of significant diagnostics from this hospitalization (including imaging, microbiology, ancillary and laboratory) are listed below for reference.    Significant Diagnostic Studies: Dg Chest 2 View  Result Date: 03/06/2019 CLINICAL DATA:  Shortness of breath EXAM: CHEST - 2 VIEW COMPARISON:  01/11/2011 FINDINGS: Bilateral interstitial and alveolar airspace opacities. Small left pleural effusion. No pneumothorax. Stable cardiomegaly. No acute osseous abnormality. IMPRESSION: Bilateral interstitial and alveolar airspace opacities with stable cardiomegaly. Differential considerations include pulmonary edema versus multilobar pneumonia. Electronically Signed   By: Kathreen Devoid   On: 03/06/2019 19:07   Ct Head Wo Contrast  Result Date: 03/18/2019 CLINICAL  DATA:  73 year old male with seizure-like activity, altered mental status. EXAM: CT HEAD WITHOUT CONTRAST TECHNIQUE: Contiguous axial images were obtained from the base of the skull through the vertex without intravenous contrast. COMPARISON:  None. FINDINGS: Brain: Linear hypodensities in both cerebellar hemispheres most compatible with small infarcts, the larger is on the right, and appears more age indeterminate. The smaller area on the left appears chronic. Comparatively mild cerebral white matter hypodensity in both hemispheres. No cortical encephalomalacia identified. Deep gray matter nuclei are within normal limits for age. No midline shift, ventriculomegaly, mass effect, evidence of mass lesion, intracranial hemorrhage or evidence of cortically based acute infarction. Cavum septum pellucidum, normal variant. Vascular: Calcified atherosclerosis at the skull base. No suspicious intracranial vascular hyperdensity. Skull: No acute osseous abnormality identified. Sinuses/Orbits: Paranasal sinuses and mastoids are well pneumatized. Other: Visualized orbits and scalp soft tissues are within normal limits. IMPRESSION: 1. Small bilateral cerebellar infarcts, probably chronic although that on the right is age indeterminate. 2. Largely normal for age noncontrast CT appearance of the cerebral hemispheres and no other acute intracranial abnormality. Electronically Signed   By: Genevie Ann M.D.   On: 03/18/2019 05:45   Mr Brain Wo Contrast  Result Date: 03/20/2019 CLINICAL DATA:  73 y/o M; altered level of consciousness, unexplained. Abnormal CT head. EXAM: MRI HEAD WITHOUT CONTRAST TECHNIQUE: Multiplanar, multiecho pulse sequences of the brain and surrounding structures were obtained without intravenous contrast. COMPARISON:  03/18/2019 CT head FINDINGS: Brain: Small group of subcentimeter foci of reduced diffusion within left posterior frontal lobe subcortical white matter compatible with acute/early subacute  infarction. No associated hemorrhage or mass effect. Very small chronic infarctions are present within the bilateral cerebellar hemispheres. Early confluent nonspecific T2 FLAIR hyperintensities in subcortical and periventricular white matter as well as the pons are compatible with moderate chronic microvascular ischemic changes. Mild volume loss of the brain. Cavum septum pellucidum. Vascular: Normal flow voids. Skull and upper cervical spine: Normal marrow signal. Sinuses/Orbits: Negative. Other: None. IMPRESSION: 1. Small group of subcentimeter acute/early subacute infarctions within the left posterior frontal lobe subcortical white matter. No associated hemorrhage or mass effect. 2. Moderate chronic microvascular ischemic changes and mild volume loss of the brain. Very small chronic infarctions within the bilateral cerebellar hemispheres. These results will be called to the ordering clinician or  representative by the Radiologist Assistant, and communication documented in the PACS or zVision Dashboard. Electronically Signed   By: Kristine Garbe M.D.   On: 03/20/2019 22:38   Dg Chest Port 1 View  Result Date: 03/25/2019 CLINICAL DATA:  Cough EXAM: PORTABLE CHEST 1 VIEW COMPARISON:  03/08/2019 FINDINGS: The right-sided PICC line tip terminates over the proximal SVC. The cardiac silhouette is enlarged. Diffuse hazy bilateral airspace opacities are again noted. There is a dense retrocardiac opacity. Bilateral pleural effusions are noted. There is no pneumothorax. IMPRESSION: 1. Cardiomegaly with findings suspicious for underlying pulmonary edema. An atypical infectious process can have a similar appearance. 2. Retrocardiac opacity favored to represent atelectasis with infiltrate not excluded. 3. Small bilateral pleural effusions. 4. Right-sided PICC line tip terminates over the SVC. Electronically Signed   By: Constance Holster M.D.   On: 03/25/2019 18:33   Dg Chest Port 1 View  Result Date:  03/08/2019 CLINICAL DATA:  Increased short of breath EXAM: PORTABLE CHEST 1 VIEW COMPARISON:  03/06/2019, 01/11/2019 FINDINGS: Cardiomegaly with vascular congestion and diffuse bilateral interstitial and ground-glass opacities suspicious for edema. Probable bilateral pleural effusions. Left basilar airspace consolidation. No pneumothorax. IMPRESSION: Cardiomegaly with worsening vascular congestion, pleural effusion, and interstitial and ground-glass opacities suspicious for edema. Left lung base consolidation could reflect atelectasis or pneumonia Electronically Signed   By: Donavan Foil M.D.   On: 03/08/2019 21:35   Vas US Carotid (at [place] Only)  Result Date: 03/22/2019 Carotid Arterial Duplex Study Indications:       CVA. Risk Factors:      Hypertension, hyperlipidemia, Diabetes. Other Factors:     Atrial fibrillation, COPD, OSA, CHF, NICM. Limitations:       Body habitus, Atrial fibrillation Comparison Study:  No prior study on file for comparison Performing Technologist: Sharion Dove RVS  Examination Guidelines: A complete evaluation includes B-mode imaging, spectral Doppler, color Doppler, and power Doppler as needed of all accessible portions of each vessel. Bilateral testing is considered an integral part of a complete examination. Limited examinations for reoccurring indications may be performed as noted.  Right Carotid Findings: +----------+--------+--------+--------+--------+------------------+             PSV cm/s EDV cm/s Stenosis Describe Comments            +----------+--------+--------+--------+--------+------------------+  CCA Prox   89       33                         intimal thickening  +----------+--------+--------+--------+--------+------------------+  CCA Distal 72       20                         intimal thickening  +----------+--------+--------+--------+--------+------------------+  ICA Prox   63       22                                              +----------+--------+--------+--------+--------+------------------+  ICA Distal 94       29                                             +----------+--------+--------+--------+--------+------------------+  ECA  76       12                                             +----------+--------+--------+--------+--------+------------------+ +----------+--------+-------+--------+-------------------+             PSV cm/s EDV cms Describe Arm Pressure (mmHG)  +----------+--------+-------+--------+-------------------+  Subclavian 112                                            +----------+--------+-------+--------+-------------------+ +---------+--------+--+--------+--+  Vertebral PSV cm/s 62 EDV cm/s 23  +---------+--------+--+--------+--+  Left Carotid Findings: +----------+--------+--------+--------+--------+------------------+             PSV cm/s EDV cm/s Stenosis Describe Comments            +----------+--------+--------+--------+--------+------------------+  CCA Prox   87       31                         intimal thickening  +----------+--------+--------+--------+--------+------------------+  CCA Distal 87       34                         intimal thickening  +----------+--------+--------+--------+--------+------------------+  ICA Prox   84       29                                             +----------+--------+--------+--------+--------+------------------+  ICA Distal 75       30                                             +----------+--------+--------+--------+--------+------------------+  ECA        99       13                                             +----------+--------+--------+--------+--------+------------------+ +----------+--------+--------+--------+-------------------+  Subclavian PSV cm/s EDV cm/s Describe Arm Pressure (mmHG)  +----------+--------+--------+--------+-------------------+             96                                               +----------+--------+--------+--------+-------------------+ +---------+--------+--+--------+--+  Vertebral PSV cm/s 44 EDV cm/s 19  +---------+--------+--+--------+--+  Summary: Right Carotid: The extracranial vessels were near-normal with only minimal wall                thickening or plaque. Left Carotid: The extracranial vessels were near-normal with only minimal wall               thickening or plaque. Vertebrals:  Bilateral vertebral arteries demonstrate antegrade flow. Subclavians: Normal flow hemodynamics were seen in bilateral subclavian              arteries. *  See table(s) above for measurements and observations.  Electronically signed by Antony Contras MD on 03/22/2019 at 8:41:35 AM.    Final    [place] Ekg Site Rite  Result Date: 03/09/2019 If Site Rite image not attached, placement could not be confirmed due to current cardiac rhythm.   Microbiology: Recent Results (from the past 240 hour(s))  Culture, blood (routine x 2) Call MD if unable to obtain prior to antibiotics being given     Status: None (Preliminary result)   Collection Time: 03/28/19  9:40 AM  Result Value Ref Range Status   Specimen Description BLOOD LEFT HAND  Final   Special Requests   Final    BOTTLES DRAWN AEROBIC ONLY Blood Culture adequate volume   Culture   Final    NO GROWTH 2 DAYS Performed at [facility], [address], [place], [place] [address]    Report Status PENDING  Incomplete  Culture, blood (routine x 2) Call MD if unable to obtain prior to antibiotics being given     Status: None (Preliminary result)   Collection Time: 03/28/19  9:40 AM  Result Value Ref Range Status   Specimen Description BLOOD LEFT HAND  Final   Special Requests   Final    BOTTLES DRAWN AEROBIC ONLY Blood Culture adequate volume   Culture   Final    NO GROWTH 2 DAYS Performed at [facility], [address], [place], [place] [address]    Report Status PENDING  Incomplete  Culture, Urine     Status: Abnormal  (Preliminary result)   Collection Time: 03/28/19  1:54 PM  Result Value Ref Range Status   Specimen Description URINE, RANDOM  Final   Special Requests   Final    NONE Performed at [facility], [place] [address] [place], [place] [address]    Culture >=100,000 COLONIES/mL CITROBACTER FREUNDII (A)  Final   Report Status PENDING  Incomplete  MRSA PCR Screening     Status: None   Collection Time: 03/29/19  4:10 AM  Result Value Ref Range Status   MRSA by PCR NEGATIVE NEGATIVE Final    Comment:        The GeneXpert MRSA Assay (FDA approved for NASAL specimens only), is one component of a comprehensive MRSA colonization surveillance program. It is not intended to diagnose MRSA infection nor to guide or monitor treatment for MRSA infections. Performed at [facility], [place] [address] [place], [place] [address]   Culture, sputum-assessment     Status: None   Collection Time: 03/29/19  9:19 AM  Result Value Ref Range Status   Specimen Description SPUTUM  Final   Special Requests NONE  Final   Sputum evaluation   Final    Sputum specimen not acceptable for testing.  Please recollect.   Gram Stain Report Called to,Read Back By and Verified With: Caprice Red RN, AT 1121 03/29/19 BY Rush Landmark Performed at [facility], [place] [address] [place], [place] [address]    Report Status 03/29/2019 FINAL  Final     Labs: Basic Metabolic Panel: Recent Labs  Lab 03/27/19 0415 03/27/19 0514 03/28/19 0400 03/29/19 0422 03/30/19 0500 03/31/19 0500  NA 139  --  136 138 139 141  K 3.1*  --  3.4* 3.1* 3.0* 3.3*  CL 86*  --  84* 90* 87* 90*  CO2 39*  --  42* 38* 39* 38*  GLUCOSE 185*  --  279*  165* 133* 95  BUN 84*  --  87* 91* 91* 95*  CREATININE 1.78*  --  2.15* 2.13* 1.99* 1.99*  CALCIUM 8.7*  --  8.5* 8.3* 8.4* 8.6*  MG  --  2.5* 2.2 2.3 2.1 2.3   Liver Function Tests: No results for input(s): AST, ALT, ALKPHOS, BILITOT, PROT, ALBUMIN in the last 168 hours. No results  for input(s): LIPASE, AMYLASE in the last 168 hours. No results for input(s): AMMONIA in the last 168 hours. CBC: Recent Labs  Lab 03/26/19 0450 03/27/19 0415 03/28/19 0400 03/29/19 0422 03/30/19 0500  WBC 6.3 6.5 10.6* 8.8 8.3  NEUTROABS 4.5 4.9 8.5* 7.0 6.3  HGB 8.5* 9.0* 8.9* 8.4* 8.6*  HCT 28.5* 30.6* 29.9* 28.7* 29.1*  MCV 86.9 86.9 86.9 87.0 86.4  PLT 406* 413* 372 323 329   Cardiac Enzymes: No results for input(s): CKTOTAL, CKMB, CKMBINDEX, TROPONINI in the last 168 hours. BNP: BNP (last 3 results) Recent Labs    12/01/18 0015 12/30/18 1621 03/06/19 1829  BNP 1,666.7* 1,273.0* 1,020.1*    ProBNP (last 3 results) No results for input(s): PROBNP in the last 8760 hours.  CBG: Recent Labs  Lab 03/30/19 1127 03/30/19 1616 03/30/19 2119 03/31/19 0607 03/31/19 1102  GLUCAP 193* 168* 71 89 157*       Signed:  Kayleen Memos, MD [facility] 03/31/2019, 2:02 PM

## 2019-03-31 NOTE — Care Management Important Message (Signed)
Important Message  Patient Details  Name: Nicholas Caldwell MRN: [id number] Date of Birth: Dec 02, 1945   Medicare Important Message Given:  Yes    [name] 03/31/2019, 2:13 PM

## 2019-03-31 NOTE — TOC Transition Note (Addendum)
Transition of Care [facility]) - CM/SW Discharge Note   Patient Details  Name: Nicholas Caldwell MRN: [id number] Date of Birth: 1945/12/09  [facility] CM/SW Contact:  [name], RN Phone Number: 03/31/2019, 1:14 PM   Clinical Narrative:   Pt to discharge home in care of wife, pt refused CIR and any facility placement.  Pt already set up with [facility] - will utilize Medicare benefit (CM confirmed with pt that he is still in agreement).  CM requested attending/pharmacy to reconcile discharge medications/doses and frequency to ensure there is no duplication requiring pt to purchase medications that he already has at home. Pt informed CM that he will purchase medications today at discharge at cost and will from that point get [facility] to fill - pt informed CM that he will not go to [facility] today or this week and understands he will have to pay out of pocket for this first fill - pt in agreement with [facility] pharmacy    Pt has agreed to pick up [place] filled prescriptions next week - TOC has agreed to fill a weeks amount to reduce out of pocket cost for pt at discharge.  [facility] informed [facility] at [phone] ext [phone] of new prescriptions and that pt plans to pick up early next week - pharmacist denied barriers.  Bedside nurse will fax prescriptions to [facility] at [phone].  CM spoke with [facility] Fila with [facility](pts SW Milagros Loll is out of the office)  and he sent message to primary team requesting post hospital discharge follow up appt within the next week.  Pts wife will transport home - pt confirms he has portable oxygen tank for transport home.    CM faxed discharge summary to [name] with [facility] [phone]  Final next level of care: [place] Barriers to Discharge: No Barriers Identified   Patient Goals and CMS Choice Patient states their goals for this hospitalization and ongoing recovery are:: to stay at home CMS Medicare.gov  Compare Post Acute Care list provided to:: Patient Choice offered to / list presented to : Patient  Discharge Placement                       Discharge Plan and Services In-house Referral: NA Discharge Planning Services: CM Consult Post Acute Care Choice: NA          DME Arranged: N/A DME Agency: NA       HH Arranged: RN, Disease Management, PT [place] Agency: [place] ([place]) Date HH Agency Contacted: 03/07/19 Time [place]: 1058 Representative spoke with at [place]: Hydrographic surveyor  Social Determinants of Health ([place]) Interventions     Readmission Risk Interventions No flowsheet data found.

## 2019-03-31 NOTE — Progress Notes (Signed)
Patient ID: Nicholas Caldwell, male   DOB: 1946/03/07, 73 y.o.   MRN: [id number]     Advanced Heart Failure Rounding Note  PCP-Cardiologist: No primary care provider on file.   Subjective:    Now off milrinone. He is on torsemide 100 mg bid with good UOP again yesterday.  CVP 14-15 today, same as yesterday. BUN/creatinine 88/2.12 => 94/2.23 => 87/1.88 => 88/1.9 => 84/1.78 =>  87/2.15 => 91/2.13 => 91/1.99 => 95/1.99.   Very anxious to go home.  No dyspnea at rest.  Does not want to go to CIR.   He is in NSR this morning with PACs.   He has a citrobacter UTI, on cefdinir.   MRI head with small chronic infarctions in cerebellum and small acute/early subacute infarctions left posterior frontal lobe.    Objective:   Weight Range: 122.5 kg Body mass index is 36.63 kg/m.   Vital Signs:   Temp:  [98 F (36.7 C)-98.9 F (37.2 C)] 98.4 F (36.9 C) (05/28 0757) Pulse Rate:  [58-84] 72 (05/28 0331) Resp:  [22-26] 23 (05/28 0331) BP: (89-106)/(47-65) 89/65 (05/28 0757) SpO2:  [95 %-98 %] 95 % (05/28 0331) Weight:  [122.5 kg] 122.5 kg (05/28 0623) Last BM Date: 03/29/19  Weight change: Filed Weights   03/29/19 0656 03/30/19 0604 03/31/19 0623  Weight: 121.2 kg 122.5 kg 122.5 kg    Intake/Output:   Intake/Output Summary (Last 24 hours) at 03/31/2019 1031 Last data filed at 03/31/2019 0800 Gross per 24 hour  Intake 1292 ml  Output 3375 ml  Net -2083 ml      Physical Exam    General: NAD Neck: JVP 10-12 cm, no thyromegaly or thyroid nodule.  Lungs: Decreased at bases.  CV: Nondisplaced PMI.  Heart regular S1/S2, no S3/S4, no murmur.  Lower legs wrapped.  Abdomen: Soft, nontender, no hepatosplenomegaly, no distention.  Skin: Intact without lesions or rashes.  Neurologic: Alert and oriented x 3.  Psych: Normal affect. Extremities: No clubbing or cyanosis.  HEENT: Normal.    Telemetry   NSR 70s-80s (personally reviewed).   Labs    CBC Recent Labs    03/29/19 0422  03/30/19 0500  WBC 8.8 8.3  NEUTROABS 7.0 6.3  HGB 8.4* 8.6*  HCT 28.7* 29.1*  MCV 87.0 86.4  PLT 323 340   Basic Metabolic Panel Recent Labs    03/30/19 0500 03/31/19 0500  NA 139 141  K 3.0* 3.3*  CL 87* 90*  CO2 39* 38*  GLUCOSE 133* 95  BUN 91* 95*  CREATININE 1.99* 1.99*  CALCIUM 8.4* 8.6*  MG 2.1 2.3   Liver Function Tests No results for input(s): AST, ALT, ALKPHOS, BILITOT, PROT, ALBUMIN in the last 72 hours. No results for input(s): LIPASE, AMYLASE in the last 72 hours. Cardiac Enzymes No results for input(s): CKTOTAL, CKMB, CKMBINDEX, TROPONINI in the last 72 hours.  BNP: BNP (last 3 results) Recent Labs    12/01/18 0015 12/30/18 1621 03/06/19 1829  BNP 1,666.7* 1,273.0* 1,020.1*    ProBNP (last 3 results) No results for input(s): PROBNP in the last 8760 hours.   D-Dimer No results for input(s): DDIMER in the last 72 hours. Hemoglobin A1C No results for input(s): HGBA1C in the last 72 hours. Fasting Lipid Panel No results for input(s): CHOL, HDL, LDLCALC, TRIG, CHOLHDL, LDLDIRECT in the last 72 hours. Thyroid Function Tests No results for input(s): TSH, T4TOTAL, T3FREE, THYROIDAB in the last 72 hours.  Invalid input(s): FREET3  Other results:  Imaging    No results found.   Medications:     Scheduled Medications: . apixaban  5 mg Oral BID  . arformoterol  15 mcg Nebulization BID  . bisoprolol  2.5 mg Oral Daily  . budesonide (PULMICORT) nebulizer solution  0.5 mg Nebulization BID  . cefdinir  300 mg Oral Q12H  . ferrous sulfate  325 mg Oral BID WC  . folic acid  1 mg Oral Daily  . gabapentin  600 mg Oral BID  . Gerhardt's butt cream   Topical Daily  . guaiFENesin  400 mg Oral BID  . hydrocortisone  25 mg Rectal BID  . insulin aspart  0-15 Units Subcutaneous TID WC  . insulin aspart  0-5 Units Subcutaneous QHS  . insulin aspart  8 Units Subcutaneous TID WC  . insulin NPH Human  20 Units Subcutaneous BID AC & HS  .  ipratropium-albuterol  3 mL Nebulization TID  . isosorbide-hydrALAZINE  0.5 tablet Oral TID  . latanoprost  1 drop Both Eyes QHS  . liver oil-zinc oxide   Topical BID  . pantoprazole  40 mg Oral BID  . polyethylene glycol  17 g Oral Daily  . potassium chloride  40 mEq Oral Once  . potassium chloride  40 mEq Oral TID  . rosuvastatin  20 mg Oral q1800  . spironolactone  25 mg Oral Daily  . torsemide  100 mg Oral BID    Infusions:   PRN Medications: acetaminophen, acetylcysteine, alum & mag hydroxide-simeth, diazepam, docusate sodium, fluticasone, hydrocortisone cream, lidocaine-prilocaine, ondansetron (ZOFRAN) IV, oxyCODONE-acetaminophen, sodium chloride flush   Assessment/Plan   1. Acute on chronic systolic CHF: Nonischemic cardiomyopathy.  With prominent RV failure, likely related to COPD/OSA/OHS. TEE in 3/20 with EF 40-45%, severely dilated RV with moderately decreased systolic function. He has had multiple recent admissions with CHF and COPD exacerbations.  He has not cooperated with paramedicine or [facility] home health as outpatient, has not been using CPAP per his wife, suspect significant medication noncompliance as well though he denies.  Milrinone started 5/9 for RV support. Now off milrinone with adequate co-ox.  He diuresed well again yesterday though weight not down.  His CVP this morning is 14-15, I do not think that we are going to get better this hospitalization.  With RV failure, suspect we will not get CVP to normal range.  As CVP was > 20 initially, this is an improvement.  - He can stay off milrinone.   - He remains volume overloaded but diuresis limited by cardiorenal syndrome. Without HD, I think this is absolutely as good as we can get. Situation complicated by dietary indiscretion and inability to care for himself. I do not see how we can maintain him outside of the hospital.  Continue torsemide 100 mg bid at this point and replace K aggressively.  - Continue UNNA boots  - Continue bisoprolol 2.5 daily.  - Continue Bidil 0.5 tab tid  - Continue spironolactone 25 mg daily.    - Fluid restrict.  - I think his long-term prognosis, unfortunately, will be poor with significant RV dysfunction and history of poor compliance.  2. Anemia: Baseline hgb around 8.  6.7 at admission, denies overt GI bleeding.  He has had 2 unit PRBCs and IV Fe.  Hgb 7.4 today, lower but no overt bleeding.  Colonoscopy 5/8 unremarkable but EGD showed necrotic mucosa gastric fundus/body that is concerning for gastric cancer, biopsies taken which were negative for malignancy. Hgb 8.6 today.  -  Continue Eliquis - Has received IV iron per primary team and GI 3. Atrial fibrillation/flutter: Patient has history of paroxysmal atrial fibrillation.  This admission, he appeared to be in an atypical atrial flutter.  Rate is currently controlled. Xarelto initially held with GI bleeding.  Was seen in past by Dr. Rayann Heman, not thought to be good ablation candidate.  He has not been on Tikosyn or amiodarone due to history of long QT. He is in NSR today.  - May eventually ask EP to reconsider ablation given lack of good anti-arrhythmic options. He seems to do better in NSR.  - Continue Eliquis.  4. COPD: Severe, on 3L home oxygen.  He was wheezing initially but now much improved.  This may be due to pulmonary edema, but also consider component of COPD exacerbation.  - Per primary team  5. OHS/OSA: On home oxygen.  Per wife, has not been using CPAP at home.  Will give CPAP at night here.  6. ID: Suspect this is primarily a CHF exacerbation.  Afebrile and WBCs not significantly elevated.  1 blood culture positive for what appears to be coagulase negative Staph (probably contaminant).  COVID-19 negative. He now has a citrobacter UTI.  - Continue course of Cefdinir for UTI.   7. CAD: Nonobstructive on prior cath.  No s/s ischemia. He is on Crestor. Off ASA with DOAC and GI bleed  8. Acute on chronic kidney failure  III: Baseline seems to be 1.3-1.6.  Creatinine 1.99 today, stable.   9. DM2 with poor control - TRH managing  10. CVA: MRI head showed both small acute/subacute and old CVAs.  Neuro has seen, suspect related to atrial fibrillation.  - He has started on Eliquis (was on Xarelto prior).  11. Hypokalemia: supplement.  12. Penile wound: Per primary.   From my standpoint, we have reached the extent of what we can do with an acute hospitalization.  He should go to CIR but has refused.  As above, I am not optimistic that we will keep him out of the hospital.  We will arrange followup for him next week in CHF clinic and will enroll him in paramedicine program.  Cardiac meds for home: torsemide 100 mg bid, KCl 40 mEq tid, metolazone 2.5 mg every Wednesday and Friday (take an extra 20 mEq KCl on metolazone days), Bidil 1/2 tab tid, bisoprolol 2.5 daily, spironolactone 25 daily, Eliquis 5 mg bid, Crestor 20 daily.   Length of Stay: 25  Loralie Champagne, MD  03/31/2019, 10:31 AM  Advanced Heart Failure Team Pager [phone] (M-F; 7a - 4p)  Please contact [facility] for night-coverage after hours (4p -7a ) and weekends on [email or web address]

## 2019-03-31 NOTE — Telephone Encounter (Signed)
Made contact with patients wife, she reports she will be there to let me in the home. Pt is open to home visits and we made an appointment for next Tuesday around 3-330pm.   Marylouise Stacks, EMT-Paramedic  03/31/19

## 2019-03-31 NOTE — Telephone Encounter (Signed)
Attempted to contact patient reference F/U virtual visit with Dr. Bryan Lemma after hospital admission, but patient is still in [facility] hospital. (25 days)

## 2019-03-31 NOTE — Telephone Encounter (Signed)
-----  Message from Hughie Closs, RN sent at 03/24/2019  9:18 AM EDT ----- Check status of pt. Still in hospital ? If not schedule f/u visit with [facility]. Dx:(gastritis,R/O CA,R/O H-pylori, Multi. Bx.)

## 2019-04-02 LAB — CULTURE, BLOOD (ROUTINE X 2)
Culture: NO GROWTH
Culture: NO GROWTH
Special Requests: ADEQUATE
Special Requests: ADEQUATE

## 2019-04-03 DIAGNOSIS — I2510 Atherosclerotic heart disease of native coronary artery without angina pectoris: Secondary | ICD-10-CM | POA: Diagnosis not present

## 2019-04-03 DIAGNOSIS — J189 Pneumonia, unspecified organism: Secondary | ICD-10-CM | POA: Diagnosis not present

## 2019-04-03 DIAGNOSIS — B9689 Other specified bacterial agents as the cause of diseases classified elsewhere: Secondary | ICD-10-CM | POA: Diagnosis not present

## 2019-04-03 DIAGNOSIS — J440 Chronic obstructive pulmonary disease with acute lower respiratory infection: Secondary | ICD-10-CM | POA: Diagnosis not present

## 2019-04-03 DIAGNOSIS — Z9981 Dependence on supplemental oxygen: Secondary | ICD-10-CM | POA: Diagnosis not present

## 2019-04-03 DIAGNOSIS — I110 Hypertensive heart disease with heart failure: Secondary | ICD-10-CM | POA: Diagnosis not present

## 2019-04-03 DIAGNOSIS — F17210 Nicotine dependence, cigarettes, uncomplicated: Secondary | ICD-10-CM | POA: Diagnosis not present

## 2019-04-03 DIAGNOSIS — A419 Sepsis, unspecified organism: Secondary | ICD-10-CM | POA: Diagnosis not present

## 2019-04-03 DIAGNOSIS — E1140 Type 2 diabetes mellitus with diabetic neuropathy, unspecified: Secondary | ICD-10-CM | POA: Diagnosis not present

## 2019-04-03 DIAGNOSIS — Z7901 Long term (current) use of anticoagulants: Secondary | ICD-10-CM | POA: Diagnosis not present

## 2019-04-03 DIAGNOSIS — J441 Chronic obstructive pulmonary disease with (acute) exacerbation: Secondary | ICD-10-CM | POA: Diagnosis not present

## 2019-04-03 DIAGNOSIS — I5023 Acute on chronic systolic (congestive) heart failure: Secondary | ICD-10-CM | POA: Diagnosis not present

## 2019-04-03 DIAGNOSIS — N390 Urinary tract infection, site not specified: Secondary | ICD-10-CM | POA: Diagnosis not present

## 2019-04-03 DIAGNOSIS — Z794 Long term (current) use of insulin: Secondary | ICD-10-CM | POA: Diagnosis not present

## 2019-04-05 ENCOUNTER — Other Ambulatory Visit (HOSPITAL_COMMUNITY): Payer: Self-pay

## 2019-04-05 DIAGNOSIS — J189 Pneumonia, unspecified organism: Secondary | ICD-10-CM | POA: Diagnosis not present

## 2019-04-05 DIAGNOSIS — J440 Chronic obstructive pulmonary disease with acute lower respiratory infection: Secondary | ICD-10-CM | POA: Diagnosis not present

## 2019-04-05 DIAGNOSIS — N390 Urinary tract infection, site not specified: Secondary | ICD-10-CM | POA: Diagnosis not present

## 2019-04-05 DIAGNOSIS — A419 Sepsis, unspecified organism: Secondary | ICD-10-CM | POA: Diagnosis not present

## 2019-04-05 DIAGNOSIS — B9689 Other specified bacterial agents as the cause of diseases classified elsewhere: Secondary | ICD-10-CM | POA: Diagnosis not present

## 2019-04-05 DIAGNOSIS — J441 Chronic obstructive pulmonary disease with (acute) exacerbation: Secondary | ICD-10-CM | POA: Diagnosis not present

## 2019-04-05 NOTE — Progress Notes (Signed)
Paramedicine Encounter    Patient ID: Nicholas Caldwell, male    DOB: 11-03-1946, 73 y.o.   MRN: [id number]   Patient Care Team: [facility] as PCP - General Jettie Booze, MD as Consulting Physician (Cardiology) Sharmon Revere as Physician Assistant (Cardiology) [name], MD as Attending Physician (Internal Medicine) Thompson Grayer, MD as Consulting Physician (Clinical Cardiac Electrophysiology)  Patient Active Problem List   Diagnosis Date Noted  . Cerebral embolism with cerebral infarction 03/21/2019  . Gastritis and gastroduodenitis   . NSVT (nonsustained ventricular tachycardia) ([place]) 03/08/2019  . Tobacco abuse counseling 03/08/2019  . Iron deficiency anemia   . Acute on chronic systolic CHF (congestive heart failure) ([place]) 03/06/2019  . Diabetes mellitus type 2 in obese ([place])   . Primary osteoarthritis of right hip   . Hypomagnesemia   . Steroid-induced hyperglycemia   . Supplemental oxygen dependent   . Leukocytosis   . Hypokalemia   . Acute on chronic anemia   . Diabetic peripheral neuropathy ([place])   . Acute on chronic systolic (congestive) heart failure ([place])   . Acute on chronic respiratory failure ([employer]) 01/14/2019  . Hypoxia   . Altered mental status   . Heart failure ([place]) 12/30/2018  . AKI (acute kidney injury) ([place])   . Acute respiratory failure ([place]) 11/22/2018  . Acute on chronic respiratory failure with hypoxia and hypercapnia ([place]) 11/13/2018  . Metabolic encephalopathy 38/08/1750  . Acute respiratory failure with hypoxia and hypercapnia ([place]) 07/26/2018  . Acute exacerbation of CHF (congestive heart failure) ([place]) 07/26/2018  . CHF exacerbation ([place]) 05/01/2018  . CHF (congestive heart failure) ([place]) 04/30/2018  . Chronic respiratory failure with hypoxia ([place]) 12/28/2017  . Atrial fibrillation with RVR ([place])   . SVT (supraventricular tachycardia) ([place])   . COPD GOLD 0   . Medically noncompliant   . Panlobular emphysema ([place])    . OSA (obstructive sleep apnea)   . Pulmonary hypertension ([place])   . Disorientation   . Pressure injury of skin 09/07/2016  . Acute on chronic combined systolic and diastolic CHF (congestive heart failure) ([place]) 09/05/2016  . Skin lesion-left heal 09/05/2016  . Chronic systolic CHF (congestive heart failure) ([place]) 07/16/2016  . COPD (chronic obstructive pulmonary disease) ([place]) 07/16/2016  . Uncontrolled type 2 diabetes mellitus with complication ([place])   . Diabetic polyneuropathy associated with diabetes mellitus due to underlying condition ([place])   . Normocytic anemia 06/29/2016  . CAD - Non-obstructive by LHC1/16 01/21/2016  . Nonischemic cardiomyopathy ([place]) 10/09/2015  . PAF (paroxysmal atrial fibrillation) ([place])   . Chronic pain 02/19/2015  . Morbid obesity ([place]) 02/13/2015  . Dyspnea   . Elevated troponin I level 11/01/2014  . COPD exacerbation ([place]) 11/01/2014  . Essential hypertension 10/31/2014  . Type 2 diabetes mellitus with neuropathy 10/31/2014  . Hyperlipidemia  10/31/2014  . Cigarette smoker 10/31/2014    Current Outpatient Medications:  .  albuterol (VENTOLIN HFA) 108 (90 Base) MCG/ACT inhaler, Inhale 2 puffs into the lungs every 6 (six) hours as needed for wheezing or shortness of breath., Disp: , Rfl:  .  apixaban (ELIQUIS) 5 MG TABS tablet, Take 1 tablet (5 mg total) by mouth 2 (two) times daily., Disp: 60 tablet, Rfl: 0 .  bisoprolol (ZEBETA) 5 MG tablet, Take 0.5 tablets (2.5 mg total) by mouth daily., Disp: 30 tablet, Rfl: 0 .  budesonide-formoterol (SYMBICORT) 160-4.5 MCG/ACT inhaler, Inhale 2 puffs into the lungs 2 (two) times daily., Disp: , Rfl:  .  diazepam (VALIUM) 5 MG tablet, Take 5 mg by mouth daily as needed for anxiety., Disp: , Rfl:  .  ferrous sulfate 325 (65 FE) MG tablet, Take 1 tablet (325 mg total) by mouth 2 (two) times daily with a meal., Disp: 60 tablet, Rfl: 0 .  fluticasone (FLONASE) 50 MCG/ACT nasal spray, Place 2 sprays into both  nostrils daily as needed for allergies. , Disp: , Rfl:  .  gabapentin (NEURONTIN) 600 MG tablet, Take 1 tablet (600 mg total) by mouth 2 (two) times daily., Disp: 60 tablet, Rfl: 0 .  guaifenesin (HUMIBID E) 400 MG TABS tablet, Take 1 tablet (400 mg total) by mouth every 6 (six) hours as needed. (Patient taking differently: Take 400 mg by mouth every 6 (six) hours as needed (cough). ), Disp: 10 tablet, Rfl: 0 .  hydrocortisone (ANUSOL-HC) 2.5 % rectal cream, Place 1 application rectally 2 (two) times daily as needed for hemorrhoids or itching. , Disp: , Rfl:  .  hydrocortisone 1 % lotion, Apply 1 application topically See admin instructions. Apply small amount to back twice daily for itchy rash, Disp: , Rfl:  .  insulin aspart (NOVOLOG) 100 UNIT/ML injection, Inject 4 Units into the skin 3 (three) times daily with meals., Disp: 10 mL, Rfl: 0 .  insulin aspart protamine - aspart (NOVOLOG MIX 70/30 FLEXPEN) (70-30) 100 UNIT/ML FlexPen, Inject 0.1 mLs (10 Units total) into the skin 2 (two) times daily., Disp: 15 mL, Rfl: 0 .  ipratropium-albuterol (DUONEB) 0.5-2.5 (3) MG/3ML SOLN, Take 3 mLs by nebulization 2 (two) times daily as needed (shortness of breath/wheezing). , Disp: , Rfl:  .  isosorbide-hydrALAZINE (BIDIL) 20-37.5 MG tablet, Take 0.5 tablets by mouth 3 (three) times daily., Disp: 90 tablet, Rfl: 0 .  latanoprost (XALATAN) 0.005 % ophthalmic solution, Place 1 drop into both eyes at bedtime., Disp: , Rfl:  .  [START ON 04/06/2019] metolazone (ZAROXOLYN) 2.5 MG tablet, Take 1 tablet (2.5 mg total) by mouth every Wednesday. Take extra Potassium 2mq when taking this medication  **Please also take Metolazone 2.5 mg Every Friday and Take extra Potassium 297m when taking this medication.** (Patient taking differently: Take 2.5 mg by mouth every Wednesday. Take extra Potassium 2039mwhen taking this medication  **Please also take Metolazone 2.5 mg Every Friday and Take extra Potassium 63m28mhen taking  this medication.**), Disp: 30 tablet, Rfl: 0 .  OXYGEN, Inhale 3 L into the lungs continuous. , Disp: , Rfl:  .  pantoprazole (PROTONIX) 40 MG tablet, Take 1 tablet (40 mg total) by mouth 2 (two) times daily., Disp: 60 tablet, Rfl: 0 .  polyethylene glycol (MIRALAX / GLYCOLAX) packet, Take 17 g by mouth daily. (Patient taking differently: Take 17 g by mouth daily as needed (constipation). ), Disp: 14 each, Rfl: 0 .  Potassium Chloride ER 20 MEQ TBCR, Take 40 mEq by mouth 3 (three) times daily., Disp: 90 tablet, Rfl: 0 .  rosuvastatin (CRESTOR) 20 MG tablet, Take 1 tablet (20 mg total) by mouth daily at 6 PM., Disp: 30 tablet, Rfl: 0 .  sodium chloride (OCEAN) 0.65 % SOLN nasal spray, Place 2 sprays into both nostrils as needed for congestion. , Disp: , Rfl:  .  spironolactone (ALDACTONE) 25 MG tablet, Take 1 tablet (25 mg total) by mouth daily., Disp: 30 tablet, Rfl: 0 .  torsemide (DEMADEX) 100 MG tablet, Take 1 tablet (100 mg total) by mouth 2 (two) times daily., Disp: 30 tablet, Rfl: 0 .  cefdinir (OMNICEF)  300 MG capsule, Take 1 capsule (300 mg total) by mouth every 12 (twelve) hours for 5 days. (Patient not taking: Reported on 04/05/2019), Disp: 10 capsule, Rfl: 0 No Known Allergies    Social History   Socioeconomic History  . Marital status: Married    Spouse name: Not on file  . Number of children: Not on file  . Years of education: Not on file  . Highest education level: Not on file  Occupational History  . Not on file  Social Needs  . Financial resource strain: Not on file  . Food insecurity:    Worry: Not on file    Inability: Not on file  . Transportation needs:    Medical: Not on file    Non-medical: Not on file  Tobacco Use  . Smoking status: Former Smoker    Packs/day: 1.00    Years: 34.00    Pack years: 34.00    Types: Cigarettes    Last attempt to quit: 11/30/2018    Years since quitting: 0.3  . Smokeless tobacco: Never Used  Substance and Sexual Activity  .  Alcohol use: No    Alcohol/week: 0.0 standard drinks  . Drug use: No  . Sexual activity: Not on file  Lifestyle  . Physical activity:    Days per week: Not on file    Minutes per session: Not on file  . Stress: Not on file  Relationships  . Social connections:    Talks on phone: Not on file    Gets together: Not on file    Attends religious service: Not on file    Active member of club or organization: Not on file    Attends meetings of clubs or organizations: Not on file    Relationship status: Not on file  . Intimate partner violence:    Fear of current or ex partner: Not on file    Emotionally abused: Not on file    Physically abused: Not on file    Forced sexual activity: Not on file  Other Topics Concern  . Not on file  Social History Narrative  . Not on file    Physical Exam      Future Appointments  Date Time Provider [place]  04/08/2019 10:00 AM Larey Dresser, MD [facility] None  04/08/2019  2:00 PM Cirigliano, Vito V, DO [facility]    BP 110/62   Pulse 72   Temp (!) 97 F (36.1 C)   Resp 17   Wt 268 lb (121.6 kg)   SpO2 96%   BMI 36.35 kg/m   Weight yesterday-267 CBG EMS-72  First visit with pt.  Reviewed pts meds--he does not want to use pill box organizer. He prefers to go by his d/c papers from [facility]. Advised him to be cautious as meds and their doses change and to follow the most recent paperwork.  He does not have the protonix.  He was able to verbalize each medicine to take and it was correct on the dosing.   Pt finished his antibiotic this morning so will start the ferrous sulfate in the morning.  Pt states he feels ok since he has been home.  He has wheezing all over his lungs--he did not use his inhalers/neb today.but he denies sob. He has the unna boots still on--HHN didn't have the supplies to change it when she was here yesterday so she is aware of that it needs to be done and will be back later this  week.  His CBG was  getting low--his wife fixed him a snack and drink for him and he began eating during our visit.  He has clinic appoint on Friday--he isnt sure if that is going to be a virtual visit or in person visit--will verify for him. Confirmed with amy that it is in an in person visit.  He states he understands sodium intake levels and to limit his fluid intake levels and he weighs daily.   Marylouise Stacks, [facility] Paramedic  04/05/19

## 2019-04-06 ENCOUNTER — Telehealth (HOSPITAL_COMMUNITY): Payer: Medicare Other

## 2019-04-08 ENCOUNTER — Other Ambulatory Visit: Payer: Self-pay

## 2019-04-08 ENCOUNTER — Encounter (HOSPITAL_COMMUNITY): Payer: Medicare Other | Admitting: Cardiology

## 2019-04-08 ENCOUNTER — Encounter: Payer: Self-pay | Admitting: Gastroenterology

## 2019-04-08 ENCOUNTER — Telehealth (INDEPENDENT_AMBULATORY_CARE_PROVIDER_SITE_OTHER): Payer: Medicare Other | Admitting: Gastroenterology

## 2019-04-08 VITALS — Ht 72.0 in | Wt 260.0 lb

## 2019-04-08 DIAGNOSIS — K2990 Gastroduodenitis, unspecified, without bleeding: Secondary | ICD-10-CM

## 2019-04-08 DIAGNOSIS — D500 Iron deficiency anemia secondary to blood loss (chronic): Secondary | ICD-10-CM | POA: Diagnosis not present

## 2019-04-08 DIAGNOSIS — J42 Unspecified chronic bronchitis: Secondary | ICD-10-CM | POA: Diagnosis not present

## 2019-04-08 DIAGNOSIS — I63412 Cerebral infarction due to embolism of left middle cerebral artery: Secondary | ICD-10-CM

## 2019-04-08 DIAGNOSIS — D509 Iron deficiency anemia, unspecified: Secondary | ICD-10-CM | POA: Diagnosis not present

## 2019-04-08 DIAGNOSIS — Z9981 Dependence on supplemental oxygen: Secondary | ICD-10-CM | POA: Diagnosis not present

## 2019-04-08 DIAGNOSIS — K2970 Gastritis, unspecified, without bleeding: Secondary | ICD-10-CM | POA: Diagnosis not present

## 2019-04-08 DIAGNOSIS — I4891 Unspecified atrial fibrillation: Secondary | ICD-10-CM

## 2019-04-08 DIAGNOSIS — I5022 Chronic systolic (congestive) heart failure: Secondary | ICD-10-CM

## 2019-04-08 NOTE — Progress Notes (Signed)
Chief Complaint: Hospital follow-up, IDA, gastritis, systemic anticoagulation  Referring Provider:   Kayleen Memos, DO ([facility])   HPI:    Due to current restrictions/limitations of in-office visits due to the COVID-19 pandemic, this scheduled clinical appointment was converted to a telehealth consultation via telephone.  -Time of medical discussion: 20 minutes -The patient did consent to this telephone visit and is aware of possible charges through their insurance for this visit.  -Names of all parties present: Nicholas Caldwell (patient), Nicholas Caldwell (patient's wife), Gerrit Heck, DO, [name] (physician)  Nicholas Caldwell is a 73 y.o. male with a history of diabetes, HTN, CAD, isch CM EF 40%, sdCHF, pAF on Eliquis, CVA, pHTN, MO, OSA not compliant with CPAP, COPD on home O2 3L, and smoking referred to the [facility] Clinic for hospital follow-up.  He was admitted 5/3-28 with progressive dyspnea, peripheral edema, diagnosed with COPD and CHF exacerbation, atrial fibrillation and new anemia with hemoglobin nadir 6.7.  Course complicated by ischemic stroke, likely cardioembolic with MRI brain n/f small group of left frontal acute versus early subacute strokes in the setting of atrial fibrillation/flutter.  Also complicated by H CAP and UTI.  Evaluated by Cardiology EP and was not a candidate for ablation, but could consider in the future.  Treated with heparin GTT and transition to Eliquis prior to discharge.  During admission noted to have IDA, treated with 2 units PRBCs, Feraheme x2.  EGD/colonoscopy in 5/8 without active bleeding or high-grade stigmata of bleeding, but did demonstrate focal severe gastritis with biopsies consistent with iron pill gastropathy.  Push enteroscopy on 5/12 notable for multiple nonbleeding erosions, one nonbleeding superficial gastric ulcer with clean base, with biopsies negative for metaplasia, dysplasia, or malignancy and no H. pylori  (these erosions and ulcer were probably the biopsy sites from 5/8).  Started on Protonix 40 mg p.o. twice daily x12 weeks with consideration for repeat EGD to document mucosal healing and consideration of gastric mapping.  Today he states he feels well. Tolerating all PO intake. Taking oral iron therapy. No melena or hematochezia. Weight stable. Tolerating all medications as prescribed. Has to schedule a f/u appt with Cardiologist. Had virtual appt with PCM yesterday- no medication changes.   Endoscopic history: - EGD (03/11/2019, Dr. Bryan Lemma): Localized, severely erythematous, edematous, necrotic appearing mucosa in the gastric fundus and proximal gastric body.  Biopsies demonstrate iron deposition and gastritis, without H. pylori.  Mild antral gastritis (biopsies benign). -Colonoscopy (03/11/2019, Dr. Bryan Lemma): Ascending diverticulosis, stool scattered throughout the colon with fair prep.  Visualized mucosa otherwise normal-appearing without evidence of bleeding. - Small bowel enteroscopy (03/15/2019, Dr. Rush Landmark): Diffuse moderate mucosal edema, erythema, friability with contact bleeding and granularity in the cardia/body (iron pill gastropathy).  Appearance improved from previous.  Superficial gastric ulcer and prepyloric stomach (iron pill gastropathy, intestinal metaplasia).  Normal duodenum and proximal jejunum with tattoo placed at distal extent reached.  Recommended repeat EGD in 8 to 10 weeks.    Past medical history, past surgical history, social history, family history, medications, and allergies reviewed in the chart and with patient over the phone.  Past Medical History:  Diagnosis Date  . Atrial flutter ([place])    a. recurrent AFlutter with RVR;  b. Amiodarone Rx started 4/16  . CAD (coronary artery disease)    a. LHC 1/16:  mLAD diffuse disease, pLCx mild disease, dLCx with disease but too small for PCI, RCA ok, EF  25-30%  . Chronic pain   . Chronic systolic CHF (congestive  heart failure) ([place])   . COPD (chronic obstructive pulmonary disease) ([place])   . Diabetes mellitus without complication ([place])   . Hypercholesteremia   . Hypertension   . NICM (nonischemic cardiomyopathy) ([place])    a.dx 2016. b. 2D echo 06/2016 - Last echo 07/01/16: mod dilated LV, mod LVH, EF 25-30%, mild-mod MR, sev LAE, mild-mod reduced RV systolic function, mild-mod TR, PASP 76mHG.  .Marland KitchenPAF (paroxysmal atrial fibrillation) (H[facility])    On amio - ot a candidate for flecainide due to cardiomyopathy, not a candidate for Tikosyn due to prolonged QT, and felt to be a poor candidate for ablation given left atrial size.  . Pulmonary hypertension ([facility]   . Tobacco abuse      Past Surgical History:  Procedure Laterality Date  . BIOPSY  03/11/2019   Procedure: BIOPSY;  Surgeon: CLavena Bullion DO;  [facility]  Service: Gastroenterology;;  . BIOPSY  03/15/2019   Procedure: BIOPSY;  Surgeon: MIrving Copas, MD;  [facility] [facility]  Service: Gastroenterology;;  . CARDIOVERSION N/A 07/30/2018   Procedure: CARDIOVERSION;  Surgeon: MLarey Dresser MD;  [facility];  Service: Cardiovascular;  Laterality: N/A;  . COLONOSCOPY N/A 03/11/2019   Procedure: COLONOSCOPY;  Surgeon: CLavena Bullion DO;  [facility]  Service: Gastroenterology;  Laterality: N/A;  . ENTEROSCOPY N/A 03/15/2019   Procedure: ENTEROSCOPY;  Surgeon: MRush LandmarkGTelford Nab, MD;  [facility]  Service: Gastroenterology;  Laterality: N/A;  . ESOPHAGOGASTRODUODENOSCOPY Left 03/11/2019   Procedure: ESOPHAGOGASTRODUODENOSCOPY (EGD);  Surgeon: CLavena Bullion DO;  [facility];  Service: Gastroenterology;  Laterality: Left;  . LEFT AND RIGHT HEART CATHETERIZATION WITH CORONARY ANGIOGRAM N/A 11/06/2014   Procedure: LEFT AND RIGHT HEART CATHETERIZATION WITH CORONARY ANGIOGRAM;  Surgeon: JJettie Booze MD;  [facility];  Service: Cardiovascular;  Laterality: N/A;   . RIGHT/LEFT HEART CATH AND CORONARY ANGIOGRAPHY N/A 12/06/2018   Procedure: RIGHT/LEFT HEART CATH AND CORONARY ANGIOGRAPHY;  Surgeon: MLarey Dresser MD;  [facility];  Service: Cardiovascular;  Laterality: N/A;  . SPLENECTOMY    . SUBMUCOSAL TATTOO INJECTION  03/15/2019   Procedure: SUBMUCOSAL TATTOO INJECTION;  Surgeon: MRush LandmarkGTelford Nab, MD;  [facility];  Service: Gastroenterology;;   Family History  Problem Relation Age of Onset  . Heart disease Mother   . Hypertension Mother   . Heart failure Mother   . Heart disease Father   . Hypertension Sister   . Heart attack Neg Hx   . Stroke Neg Hx   . Colon cancer Neg Hx   . Esophageal cancer Neg Hx    Social History   Tobacco Use  . Smoking status: Former Smoker    Packs/day: 1.00    Years: 34.00    Pack years: 34.00    Types: Cigarettes    Last attempt to quit: 11/30/2018    Years since quitting: 0.3  . Smokeless tobacco: Never Used  Substance Use Topics  . Alcohol use: No    Alcohol/week: 0.0 standard drinks  . Drug use: No   Current Outpatient Medications  Medication Sig Dispense Refill  . albuterol (VENTOLIN HFA) 108 (90 Base) MCG/ACT inhaler Inhale 2 puffs into the lungs every 6 (six) hours as needed for wheezing or shortness of breath.    .Marland Kitchenapixaban (ELIQUIS) 5 MG TABS tablet Take 1 tablet (5 mg total) by mouth 2 (two)  times daily. 60 tablet 0  . bisoprolol (ZEBETA) 5 MG tablet Take 0.5 tablets (2.5 mg total) by mouth daily. 30 tablet 0  . budesonide-formoterol (SYMBICORT) 160-4.5 MCG/ACT inhaler Inhale 2 puffs into the lungs 2 (two) times daily.    . diazepam (VALIUM) 5 MG tablet Take 5 mg by mouth daily as needed for anxiety.    . ferrous sulfate 325 (65 FE) MG tablet Take 1 tablet (325 mg total) by mouth 2 (two) times daily with a meal. 60 tablet 0  . fluticasone (FLONASE) 50 MCG/ACT nasal spray Place 2 sprays into both nostrils daily as needed for allergies.     Marland Kitchen gabapentin  (NEURONTIN) 600 MG tablet Take 1 tablet (600 mg total) by mouth 2 (two) times daily. 60 tablet 0  . guaifenesin (HUMIBID E) 400 MG TABS tablet Take 1 tablet (400 mg total) by mouth every 6 (six) hours as needed. (Patient taking differently: Take 400 mg by mouth every 6 (six) hours as needed (cough). ) 10 tablet 0  . hydrocortisone (ANUSOL-HC) 2.5 % rectal cream Place 1 application rectally 2 (two) times daily as needed for hemorrhoids or itching.     . hydrocortisone 1 % lotion Apply 1 application topically See admin instructions. Apply small amount to back twice daily for itchy rash    . insulin aspart (NOVOLOG) 100 UNIT/ML injection Inject 4 Units into the skin 3 (three) times daily with meals. 10 mL 0  . insulin aspart protamine - aspart (NOVOLOG MIX 70/30 FLEXPEN) (70-30) 100 UNIT/ML FlexPen Inject 0.1 mLs (10 Units total) into the skin 2 (two) times daily. 15 mL 0  . ipratropium-albuterol (DUONEB) 0.5-2.5 (3) MG/3ML SOLN Take 3 mLs by nebulization 2 (two) times daily as needed (shortness of breath/wheezing).     . isosorbide-hydrALAZINE (BIDIL) 20-37.5 MG tablet Take 0.5 tablets by mouth 3 (three) times daily. 90 tablet 0  . latanoprost (XALATAN) 0.005 % ophthalmic solution Place 1 drop into both eyes at bedtime.    . metolazone (ZAROXOLYN) 2.5 MG tablet Take 1 tablet (2.5 mg total) by mouth every Wednesday. Take extra Potassium 12mq when taking this medication  **Please also take Metolazone 2.5 mg Every Friday and Take extra Potassium 296m when taking this medication.** (Patient taking differently: Take 2.5 mg by mouth every Wednesday. Take extra Potassium 2038mwhen taking this medication  **Please also take Metolazone 2.5 mg Every Friday and Take extra Potassium 73m23mhen taking this medication.**) 30 tablet 0  . OXYGEN Inhale 3 L into the lungs continuous.     . pantoprazole (PROTONIX) 40 MG tablet Take 1 tablet (40 mg total) by mouth 2 (two) times daily. 60 tablet 0  . polyethylene  glycol (MIRALAX / GLYCOLAX) packet Take 17 g by mouth daily. (Patient taking differently: Take 17 g by mouth daily as needed (constipation). ) 14 each 0  . Potassium Chloride ER 20 MEQ TBCR Take 40 mEq by mouth 3 (three) times daily. 90 tablet 0  . rosuvastatin (CRESTOR) 20 MG tablet Take 1 tablet (20 mg total) by mouth daily at 6 PM. 30 tablet 0  . sodium chloride (OCEAN) 0.65 % SOLN nasal spray Place 2 sprays into both nostrils as needed for congestion.     . spMarland Kitchenronolactone (ALDACTONE) 25 MG tablet Take 1 tablet (25 mg total) by mouth daily. 30 tablet 0  . torsemide (DEMADEX) 100 MG tablet Take 1 tablet (100 mg total) by mouth 2 (two) times daily. 30 tablet 0   No current  facility-administered medications for this visit.    No Known Allergies   Review of Systems: All systems reviewed and negative except where noted in HPI.     Physical Exam:    Physical exam not completed due to the nature of this telehealth communication.  Patient was otherwise alert and oriented and well communicative.   ASSESSMENT AND PLAN;   1) Iron deficiency anemia 2) Gastritis  -Repeat CBC and iron panel in 2 months -Resume oral iron therapy -Resume high-dose PPI x8 to 10 weeks, then can reduce to daily Protonix 40 mg for ongoing gastric prophylaxis -Continue to monitor for overt GI blood loss -Had previously planned on repeat EGD to document mucosal healing and consider repeat biopsies for GIM mapping.  However in light of acute/subacute CVA during recent hospitalization and need for anticoagulation, will elect to defer this procedure for another 6 to 12 months, if at all -If ongoing IDA despite improvement in gastritis, could consider VCE for small bowel interrogation - Previous expressed that he does not want to do a colonoscopy unless necessary/urgent/emergent.    3) CVA 4) Atrial fibrillation/flutter  - Resume systemic anticoagulation as prescribed.  No ongoing overt GI blood loss -As above,  holding off on elective endoscopic procedure  5) COPD on home O2 3 L: - Any endoscopic procedures need to be completed at [facility] given home O2 therapy  RTC in 3-6 months or sooner as needed  I spent a total of 25 minutes of face-to-face time with the patient. Greater than 50% of the time was spent counseling and coordinating care.    Lavena Bullion, DO, FACG  04/08/2019, 1:52 PM   [facility]

## 2019-04-08 NOTE — Patient Instructions (Signed)
To help prevent the possible spread of infection to our patients, communities, and staff; we will be implementing the following measures:  As of now we are not allowing any visitors/family members to accompany you to any upcoming appointments with [facility]. If you have any concerns about this please contact our office to discuss prior to the appointment.   Your provider has requested that you go in 2 months to the basement level for lab work at [facility] in [place] [address]. Press "B" on the elevator. The lab is located at the first door on the left as you exit the elevator.   Please schedule a follow up at [phone] in 3-6 months

## 2019-04-09 ENCOUNTER — Emergency Department (HOSPITAL_COMMUNITY): Payer: Non-veteran care

## 2019-04-09 ENCOUNTER — Other Ambulatory Visit: Payer: Self-pay

## 2019-04-09 ENCOUNTER — Encounter (HOSPITAL_COMMUNITY): Payer: Self-pay | Admitting: Emergency Medicine

## 2019-04-09 ENCOUNTER — Inpatient Hospital Stay (HOSPITAL_COMMUNITY)
Admission: EM | Admit: 2019-04-09 | Discharge: 2019-04-14 | DRG: 291 | Disposition: A | Discharge status: Home Health | Payer: Non-veteran care | Attending: Internal Medicine | Admitting: Internal Medicine

## 2019-04-09 DIAGNOSIS — Z7951 Long term (current) use of inhaled steroids: Secondary | ICD-10-CM

## 2019-04-09 DIAGNOSIS — R748 Abnormal levels of other serum enzymes: Secondary | ICD-10-CM | POA: Diagnosis present

## 2019-04-09 DIAGNOSIS — J441 Chronic obstructive pulmonary disease with (acute) exacerbation: Secondary | ICD-10-CM | POA: Diagnosis present

## 2019-04-09 DIAGNOSIS — J9601 Acute respiratory failure with hypoxia: Secondary | ICD-10-CM | POA: Diagnosis not present

## 2019-04-09 DIAGNOSIS — Z20828 Contact with and (suspected) exposure to other viral communicable diseases: Secondary | ICD-10-CM | POA: Diagnosis present

## 2019-04-09 DIAGNOSIS — R0902 Hypoxemia: Secondary | ICD-10-CM | POA: Diagnosis not present

## 2019-04-09 DIAGNOSIS — G9341 Metabolic encephalopathy: Secondary | ICD-10-CM | POA: Diagnosis not present

## 2019-04-09 DIAGNOSIS — E875 Hyperkalemia: Secondary | ICD-10-CM | POA: Diagnosis not present

## 2019-04-09 DIAGNOSIS — E1122 Type 2 diabetes mellitus with diabetic chronic kidney disease: Secondary | ICD-10-CM | POA: Diagnosis present

## 2019-04-09 DIAGNOSIS — Z6835 Body mass index (BMI) 35.0-35.9, adult: Secondary | ICD-10-CM

## 2019-04-09 DIAGNOSIS — I5043 Acute on chronic combined systolic (congestive) and diastolic (congestive) heart failure: Secondary | ICD-10-CM | POA: Diagnosis not present

## 2019-04-09 DIAGNOSIS — I10 Essential (primary) hypertension: Secondary | ICD-10-CM | POA: Diagnosis present

## 2019-04-09 DIAGNOSIS — Z9114 Patient's other noncompliance with medication regimen: Secondary | ICD-10-CM

## 2019-04-09 DIAGNOSIS — E785 Hyperlipidemia, unspecified: Secondary | ICD-10-CM | POA: Diagnosis present

## 2019-04-09 DIAGNOSIS — E662 Morbid (severe) obesity with alveolar hypoventilation: Secondary | ICD-10-CM | POA: Diagnosis not present

## 2019-04-09 DIAGNOSIS — I959 Hypotension, unspecified: Secondary | ICD-10-CM | POA: Diagnosis not present

## 2019-04-09 DIAGNOSIS — R4182 Altered mental status, unspecified: Secondary | ICD-10-CM | POA: Diagnosis not present

## 2019-04-09 DIAGNOSIS — E1169 Type 2 diabetes mellitus with other specified complication: Secondary | ICD-10-CM | POA: Diagnosis present

## 2019-04-09 DIAGNOSIS — J9621 Acute and chronic respiratory failure with hypoxia: Secondary | ICD-10-CM | POA: Diagnosis not present

## 2019-04-09 DIAGNOSIS — N183 Chronic kidney disease, stage 3 (moderate): Secondary | ICD-10-CM | POA: Diagnosis present

## 2019-04-09 DIAGNOSIS — I2510 Atherosclerotic heart disease of native coronary artery without angina pectoris: Secondary | ICD-10-CM | POA: Diagnosis present

## 2019-04-09 DIAGNOSIS — J9602 Acute respiratory failure with hypercapnia: Secondary | ICD-10-CM | POA: Diagnosis not present

## 2019-04-09 DIAGNOSIS — E669 Obesity, unspecified: Secondary | ICD-10-CM | POA: Diagnosis not present

## 2019-04-09 DIAGNOSIS — Z9081 Acquired absence of spleen: Secondary | ICD-10-CM

## 2019-04-09 DIAGNOSIS — I4892 Unspecified atrial flutter: Secondary | ICD-10-CM | POA: Diagnosis present

## 2019-04-09 DIAGNOSIS — J811 Chronic pulmonary edema: Secondary | ICD-10-CM | POA: Diagnosis not present

## 2019-04-09 DIAGNOSIS — R000 Tachycardia, unspecified: Secondary | ICD-10-CM | POA: Diagnosis not present

## 2019-04-09 DIAGNOSIS — I130 Hypertensive heart and chronic kidney disease with heart failure and stage 1 through stage 4 chronic kidney disease, or unspecified chronic kidney disease: Secondary | ICD-10-CM | POA: Diagnosis not present

## 2019-04-09 DIAGNOSIS — Z781 Physical restraint status: Secondary | ICD-10-CM

## 2019-04-09 DIAGNOSIS — E870 Hyperosmolality and hypernatremia: Secondary | ICD-10-CM | POA: Diagnosis not present

## 2019-04-09 DIAGNOSIS — E876 Hypokalemia: Secondary | ICD-10-CM | POA: Diagnosis not present

## 2019-04-09 DIAGNOSIS — I5023 Acute on chronic systolic (congestive) heart failure: Secondary | ICD-10-CM | POA: Diagnosis not present

## 2019-04-09 DIAGNOSIS — G9340 Encephalopathy, unspecified: Secondary | ICD-10-CM

## 2019-04-09 DIAGNOSIS — Z794 Long term (current) use of insulin: Secondary | ICD-10-CM

## 2019-04-09 DIAGNOSIS — I480 Paroxysmal atrial fibrillation: Secondary | ICD-10-CM | POA: Diagnosis not present

## 2019-04-09 DIAGNOSIS — Z9119 Patient's noncompliance with other medical treatment and regimen: Secondary | ICD-10-CM | POA: Diagnosis not present

## 2019-04-09 DIAGNOSIS — Z91199 Patient's noncompliance with other medical treatment and regimen due to unspecified reason: Secondary | ICD-10-CM

## 2019-04-09 DIAGNOSIS — Z87891 Personal history of nicotine dependence: Secondary | ICD-10-CM

## 2019-04-09 DIAGNOSIS — Z9981 Dependence on supplemental oxygen: Secondary | ICD-10-CM

## 2019-04-09 DIAGNOSIS — J90 Pleural effusion, not elsewhere classified: Secondary | ICD-10-CM

## 2019-04-09 DIAGNOSIS — R404 Transient alteration of awareness: Secondary | ICD-10-CM | POA: Diagnosis not present

## 2019-04-09 DIAGNOSIS — J440 Chronic obstructive pulmonary disease with acute lower respiratory infection: Secondary | ICD-10-CM | POA: Diagnosis not present

## 2019-04-09 DIAGNOSIS — J9622 Acute and chronic respiratory failure with hypercapnia: Secondary | ICD-10-CM | POA: Diagnosis not present

## 2019-04-09 DIAGNOSIS — I272 Pulmonary hypertension, unspecified: Secondary | ICD-10-CM | POA: Diagnosis present

## 2019-04-09 DIAGNOSIS — Z8673 Personal history of transient ischemic attack (TIA), and cerebral infarction without residual deficits: Secondary | ICD-10-CM

## 2019-04-09 DIAGNOSIS — J9600 Acute respiratory failure, unspecified whether with hypoxia or hypercapnia: Secondary | ICD-10-CM

## 2019-04-09 DIAGNOSIS — R4020 Unspecified coma: Secondary | ICD-10-CM | POA: Diagnosis not present

## 2019-04-09 DIAGNOSIS — J181 Lobar pneumonia, unspecified organism: Secondary | ICD-10-CM

## 2019-04-09 DIAGNOSIS — Z9111 Patient's noncompliance with dietary regimen: Secondary | ICD-10-CM

## 2019-04-09 DIAGNOSIS — Z8249 Family history of ischemic heart disease and other diseases of the circulatory system: Secondary | ICD-10-CM

## 2019-04-09 DIAGNOSIS — Z7901 Long term (current) use of anticoagulants: Secondary | ICD-10-CM

## 2019-04-09 LAB — COMPREHENSIVE METABOLIC PANEL
ALT: 25 U/L (ref 0–44)
AST: 19 U/L (ref 15–41)
Albumin: 3.4 g/dL — ABNORMAL LOW (ref 3.5–5.0)
Alkaline Phosphatase: 171 U/L — ABNORMAL HIGH (ref 38–126)
Anion gap: 5 (ref 5–15)
BUN: 35 mg/dL — ABNORMAL HIGH (ref 8–23)
CO2: 27 mmol/L (ref 22–32)
Calcium: 8.9 mg/dL (ref 8.9–10.3)
Chloride: 108 mmol/L (ref 98–111)
Creatinine, Ser: 1.36 mg/dL — ABNORMAL HIGH (ref 0.61–1.24)
GFR calc Af Amer: >60 mL/min (ref >60)
GFR calc non Af Amer: 52 mL/min — ABNORMAL LOW (ref >60)
Glucose, Bld: 110 mg/dL — ABNORMAL HIGH (ref 70–99)
Potassium: >7.5 mmol/L (ref 3.5–5.1)
Sodium: 140 mmol/L (ref 135–145)
Total Bilirubin: 0.2 mg/dL — ABNORMAL LOW (ref 0.3–1.2)
Total Protein: 7.5 g/dL (ref 6.5–8.1)

## 2019-04-09 LAB — SARS CORONAVIRUS 2 BY RT PCR (HOSPITAL ORDER, PERFORMED IN ~~LOC~~ HOSPITAL LAB): SARS Coronavirus 2: NEGATIVE

## 2019-04-09 LAB — CBG MONITORING, ED: Glucose-Capillary: 108 mg/dL — ABNORMAL HIGH (ref 70–99)

## 2019-04-09 LAB — URINALYSIS, ROUTINE W REFLEX MICROSCOPIC
Bilirubin Urine: NEGATIVE
Glucose, UA: NEGATIVE mg/dL
Hgb urine dipstick: NEGATIVE
Ketones, ur: NEGATIVE mg/dL
Leukocytes,Ua: NEGATIVE
Nitrite: NEGATIVE
Protein, ur: NEGATIVE mg/dL
Specific Gravity, Urine: 1.009 (ref 1.005–1.030)
pH: 5 (ref 5.0–8.0)

## 2019-04-09 LAB — CBC WITH DIFFERENTIAL/PLATELET
Abs Immature Granulocytes: 0.01 10*3/uL (ref 0.00–0.07)
Basophils Absolute: 0 10*3/uL (ref 0.0–0.1)
Basophils Relative: 1 %
Eosinophils Absolute: 0.1 10*3/uL (ref 0.0–0.5)
Eosinophils Relative: 1 %
HCT: 37.3 % — ABNORMAL LOW (ref 39.0–52.0)
Hemoglobin: 9.9 g/dL — ABNORMAL LOW (ref 13.0–17.0)
Immature Granulocytes: 0 %
Lymphocytes Relative: 14 %
Lymphs Abs: 0.8 10*3/uL (ref 0.7–4.0)
MCH: 25.3 pg — ABNORMAL LOW (ref 26.0–34.0)
MCHC: 26.5 g/dL — ABNORMAL LOW (ref 30.0–36.0)
MCV: 95.2 fL (ref 80.0–100.0)
Monocytes Absolute: 0.8 10*3/uL (ref 0.1–1.0)
Monocytes Relative: 13 %
Neutro Abs: 4.2 10*3/uL (ref 1.7–7.7)
Neutrophils Relative %: 71 %
Platelets: 319 10*3/uL (ref 150–400)
RBC: 3.92 MIL/uL — ABNORMAL LOW (ref 4.22–5.81)
RDW: 21.6 % — ABNORMAL HIGH (ref 11.5–15.5)
WBC: 6 10*3/uL (ref 4.0–10.5)
nRBC: 0.5 % — ABNORMAL HIGH (ref 0.0–0.2)

## 2019-04-09 LAB — I-STAT CHEM 8, ED
BUN: 33 mg/dL — ABNORMAL HIGH (ref 8–23)
Calcium, Ion: 1.21 mmol/L (ref 1.15–1.40)
Chloride: 111 mmol/L (ref 98–111)
Creatinine, Ser: 1.4 mg/dL — ABNORMAL HIGH (ref 0.61–1.24)
Glucose, Bld: 110 mg/dL — ABNORMAL HIGH (ref 70–99)
HCT: 34 % — ABNORMAL LOW (ref 39.0–52.0)
Hemoglobin: 11.6 g/dL — ABNORMAL LOW (ref 13.0–17.0)
Potassium: 7.7 mmol/L (ref 3.5–5.1)
Sodium: 140 mmol/L (ref 135–145)
TCO2: 29 mmol/L (ref 22–32)

## 2019-04-09 LAB — BLOOD GAS, VENOUS
Acid-base deficit: 0.5 mmol/L (ref 0.0–2.0)
Bicarbonate: 27 mmol/L (ref 20.0–28.0)
O2 Content: 3 L/min
O2 Saturation: 99.4 %
Patient temperature: 98.6
pCO2, Ven: 62.9 mmHg — ABNORMAL HIGH (ref 44.0–60.0)
pH, Ven: 7.256 (ref 7.250–7.430)
pO2, Ven: 175 mmHg — ABNORMAL HIGH (ref 32.0–45.0)

## 2019-04-09 LAB — POTASSIUM: Potassium: >7.5 mmol/L (ref 3.5–5.1)

## 2019-04-09 LAB — LACTIC ACID, PLASMA
Lactic Acid, Venous: 0.5 mmol/L (ref 0.5–1.9)
Lactic Acid, Venous: 0.5 mmol/L (ref 0.5–1.9)

## 2019-04-09 LAB — TROPONIN I: Troponin I: <0.03 ng/mL (ref <0.03)

## 2019-04-09 MED ORDER — INSULIN ASPART 100 UNIT/ML IV SOLN
10.0000 [IU] | Freq: Once | INTRAVENOUS | Status: AC
  Administered 2019-04-09: 10 [IU] via INTRAVENOUS
  Filled 2019-04-09: qty 0.1

## 2019-04-09 MED ORDER — INSULIN ASPART 100 UNIT/ML ~~LOC~~ SOLN
0.0000 [IU] | Freq: Three times a day (TID) | SUBCUTANEOUS | Status: DC
  Administered 2019-04-10 (×2): 1 [IU] via SUBCUTANEOUS
  Administered 2019-04-11: 2 [IU] via SUBCUTANEOUS
  Administered 2019-04-11: 08:00:00 3 [IU] via SUBCUTANEOUS
  Administered 2019-04-11: 2 [IU] via SUBCUTANEOUS
  Administered 2019-04-12 (×2): 3 [IU] via SUBCUTANEOUS
  Administered 2019-04-12 – 2019-04-13 (×2): 2 [IU] via SUBCUTANEOUS
  Administered 2019-04-13 (×2): 3 [IU] via SUBCUTANEOUS
  Administered 2019-04-14: 17:00:00 5 [IU] via SUBCUTANEOUS
  Administered 2019-04-14 (×2): 2 [IU] via SUBCUTANEOUS

## 2019-04-09 MED ORDER — ALBUTEROL SULFATE HFA 108 (90 BASE) MCG/ACT IN AERS
2.0000 | INHALATION_SPRAY | Freq: Once | RESPIRATORY_TRACT | Status: AC
  Administered 2019-04-09: 2 via RESPIRATORY_TRACT

## 2019-04-09 MED ORDER — DEXTROSE 50 % IV SOLN: 1.0000 | Freq: Once | INTRAVENOUS | Status: DC

## 2019-04-09 MED ORDER — INSULIN ASPART 100 UNIT/ML IV SOLN: 5.0000 [IU] | Freq: Once | INTRAVENOUS | Status: DC

## 2019-04-09 MED ORDER — METHYLPREDNISOLONE SODIUM SUCC 125 MG IJ SOLR
125.0000 mg | Freq: Once | INTRAMUSCULAR | Status: AC
  Administered 2019-04-09: 23:00:00 125 mg via INTRAVENOUS
  Filled 2019-04-09: qty 2

## 2019-04-09 MED ORDER — METHYLPREDNISOLONE SODIUM SUCC 125 MG IJ SOLR
60.0000 mg | Freq: Three times a day (TID) | INTRAMUSCULAR | Status: DC
  Administered 2019-04-10 – 2019-04-13 (×10): 60 mg via INTRAVENOUS
  Filled 2019-04-09 (×10): qty 2

## 2019-04-09 MED ORDER — CALCIUM GLUCONATE-NACL 1-0.675 GM/50ML-% IV SOLN
1.0000 g | Freq: Once | INTRAVENOUS | Status: AC
  Administered 2019-04-09: 1000 mg via INTRAVENOUS
  Filled 2019-04-09: qty 50

## 2019-04-09 MED ORDER — FUROSEMIDE 10 MG/ML IJ SOLN
40.0000 mg | Freq: Two times a day (BID) | INTRAMUSCULAR | Status: DC
  Administered 2019-04-10: 40 mg via INTRAVENOUS
  Filled 2019-04-09: qty 4

## 2019-04-09 MED ORDER — ALBUTEROL SULFATE (2.5 MG/3ML) 0.083% IN NEBU: 10.0000 mg | INHALATION_SOLUTION | Freq: Once | RESPIRATORY_TRACT | Status: DC

## 2019-04-09 MED ORDER — SODIUM BICARBONATE 8.4 % IV SOLN
50.0000 meq | Freq: Once | INTRAVENOUS | Status: AC
  Administered 2019-04-09: 50 meq via INTRAVENOUS
  Filled 2019-04-09: qty 50

## 2019-04-09 MED ORDER — SODIUM CHLORIDE 0.9 % IV SOLN
2.0000 g | Freq: Once | INTRAVENOUS | Status: AC
  Administered 2019-04-09: 2 g via INTRAVENOUS
  Filled 2019-04-09: qty 2

## 2019-04-09 MED ORDER — ALBUTEROL SULFATE HFA 108 (90 BASE) MCG/ACT IN AERS
4.0000 | INHALATION_SPRAY | Freq: Once | RESPIRATORY_TRACT | Status: AC
  Administered 2019-04-09: 4 via RESPIRATORY_TRACT
  Filled 2019-04-09: qty 6.7

## 2019-04-09 MED ORDER — ACETAMINOPHEN 650 MG RE SUPP: 650.0000 mg | Freq: Four times a day (QID) | RECTAL | Status: DC | PRN

## 2019-04-09 MED ORDER — ALBUTEROL SULFATE (2.5 MG/3ML) 0.083% IN NEBU: 2.5000 mg | INHALATION_SOLUTION | RESPIRATORY_TRACT | Status: DC | PRN

## 2019-04-09 MED ORDER — FUROSEMIDE 10 MG/ML IJ SOLN
40.0000 mg | Freq: Once | INTRAMUSCULAR | Status: AC
  Administered 2019-04-09: 40 mg via INTRAVENOUS
  Filled 2019-04-09: qty 4

## 2019-04-09 MED ORDER — IPRATROPIUM-ALBUTEROL 0.5-2.5 (3) MG/3ML IN SOLN
3.0000 mL | Freq: Four times a day (QID) | RESPIRATORY_TRACT | Status: DC
  Administered 2019-04-10 – 2019-04-13 (×16): 3 mL via RESPIRATORY_TRACT
  Filled 2019-04-09 (×16): qty 3

## 2019-04-09 MED ORDER — ACETAMINOPHEN 325 MG PO TABS
650.0000 mg | ORAL_TABLET | Freq: Four times a day (QID) | ORAL | Status: DC | PRN
  Administered 2019-04-11 – 2019-04-13 (×2): 650 mg via ORAL
  Filled 2019-04-09 (×2): qty 2

## 2019-04-09 MED ORDER — DEXTROSE 50 % IV SOLN
1.0000 | Freq: Once | INTRAVENOUS | Status: AC
  Administered 2019-04-09: 50 mL via INTRAVENOUS
  Filled 2019-04-09: qty 50

## 2019-04-09 NOTE — ED Notes (Signed)
CRITICAL VALUE STICKER  CRITICAL VALUE: K+ >7.5  RECEIVER (on-site recipient of call): Jake T RN  DATE & TIME NOTIFIED: 04/09/19 1116p  MESSENGER (representative from lab): Merrilyn Puma  MD NOTIFIED: Ralene Bathe, MD  TIME OF NOTIFICATION: 1103P  RESPONSE: see orders

## 2019-04-09 NOTE — ED Provider Notes (Signed)
[facility] DEPT Provider Note   CSN: [id number] Arrival date & time: 04/09/19  5027    History   Chief Complaint Chief Complaint  Patient presents with   Altered Mental Status    HPI Nicholas Caldwell is a 73 y.o. male.     The history is provided by the patient and medical records. No language interpreter was used.  Altered Mental Status   Nicholas Caldwell is a 73 y.o. male who presents to the Emergency Department complaining of AMS. Level V caveat due to altered mental status. History is provided by EMS. Per report he has been altered for the last two days. He was recently hospitalized and treated with an antibiotic for urinary tract infection. He has been off of his antibiotic for the last two days. He is coming from home. Attempted to call wife on patients ED arrival but there was no answer.   Past Medical History:  Diagnosis Date   Atrial flutter ([place])    a. recurrent AFlutter with RVR;  b. Amiodarone Rx started 4/16   CAD (coronary artery disease)    a. LHC 1/16:  mLAD diffuse disease, pLCx mild disease, dLCx with disease but too small for PCI, RCA ok, EF 25-30%   Chronic pain    Chronic systolic CHF (congestive heart failure) (HCC)    COPD (chronic obstructive pulmonary disease) ([place])    Diabetes mellitus without complication ([place])    Hypercholesteremia    Hypertension    NICM (nonischemic cardiomyopathy) ([place])    a.dx 2016. b. 2D echo 06/2016 - Last echo 07/01/16: mod dilated LV, mod LVH, EF 25-30%, mild-mod MR, sev LAE, mild-mod reduced RV systolic function, mild-mod TR, PASP 46mHG.   PAF (paroxysmal atrial fibrillation) (HCC)    On amio - ot a candidate for flecainide due to cardiomyopathy, not a candidate for Tikosyn due to prolonged QT, and felt to be a poor candidate for ablation given left atrial size.   Pulmonary hypertension (HCC)    Tobacco abuse     Patient Active Problem List   Diagnosis Date Noted   Cerebral embolism  with cerebral infarction 03/21/2019   Gastritis and gastroduodenitis    NSVT (nonsustained ventricular tachycardia) ([place] 03/08/2019   Tobacco abuse counseling 03/08/2019   Iron deficiency anemia    Acute on chronic systolic CHF (congestive heart failure) ([place] 03/06/2019   Diabetes mellitus type 2 in obese ([facility]    Primary osteoarthritis of right hip    Hypomagnesemia    Steroid-induced hyperglycemia    Supplemental oxygen dependent    Leukocytosis    Hypokalemia    Acute on chronic anemia    Diabetic peripheral neuropathy (HCC)    Acute on chronic systolic (congestive) heart failure (HCC)    Acute on chronic respiratory failure ([place] 01/14/2019   Hypoxia    Altered mental status    Heart failure ([place] 12/30/2018   AKI (acute kidney injury) ([place]    Acute respiratory failure ([place] 11/22/2018   Acute on chronic respiratory failure with hypoxia and hypercapnia (HCC) 074/10/8785  Metabolic encephalopathy 076/72/0947  Acute respiratory failure with hypoxia and hypercapnia (HCC) 07/26/2018   Acute exacerbation of CHF (congestive heart failure) ([place] 07/26/2018   CHF exacerbation ([place] 05/01/2018   CHF (congestive heart failure) ([place] 04/30/2018   Chronic respiratory failure with hypoxia ([place] 12/28/2017   Atrial fibrillation with RVR (HCC)    SVT (supraventricular tachycardia) (HCC)    COPD GOLD 0    Medically noncompliant  Panlobular emphysema (HCC)    OSA (obstructive sleep apnea)    Pulmonary hypertension (HCC)    Disorientation    Pressure injury of skin 09/07/2016   Acute on chronic combined systolic and diastolic CHF (congestive heart failure) ([place]) 09/05/2016   Skin lesion-left heal 31/49/7026   Chronic systolic CHF (congestive heart failure) ([place]) 07/16/2016   COPD (chronic obstructive pulmonary disease) ([place]) 07/16/2016   Uncontrolled type 2 diabetes mellitus with complication (HCC)    Diabetic polyneuropathy associated with  diabetes mellitus due to underlying condition (HCC)    Normocytic anemia 06/29/2016   CAD - Non-obstructive by LHC1/16 01/21/2016   Nonischemic cardiomyopathy ([place]) 10/09/2015   PAF (paroxysmal atrial fibrillation) (HCC)    Chronic pain 02/19/2015   Morbid obesity ([place]) 02/13/2015   Dyspnea    Elevated troponin I level 11/01/2014   COPD exacerbation ([place]) 11/01/2014   Essential hypertension 10/31/2014   Type 2 diabetes mellitus with neuropathy 10/31/2014   Hyperlipidemia  10/31/2014   Cigarette smoker 10/31/2014    Past Surgical History:  Procedure Laterality Date   BIOPSY  03/11/2019   Procedure: BIOPSY;  Surgeon: Lavena Bullion, DO;  Location: [place];  Service: Gastroenterology;;   BIOPSY  03/15/2019   Procedure: BIOPSY;  Surgeon: Irving Copas., MD;  Location: [facility] ENDOSCOPY;  Service: Gastroenterology;;   CARDIOVERSION N/A 07/30/2018   Procedure: CARDIOVERSION;  Surgeon: Larey Dresser, MD;  Location: [facility] ENDOSCOPY;  Service: Cardiovascular;  Laterality: N/A;   COLONOSCOPY N/A 03/11/2019   Procedure: COLONOSCOPY;  Surgeon: Lavena Bullion, DO;  Location: [place] ENDOSCOPY;  Service: Gastroenterology;  Laterality: N/A;   ENTEROSCOPY N/A 03/15/2019   Procedure: ENTEROSCOPY;  Surgeon: Rush Landmark Telford Nab., MD;  Location: [place];  Service: Gastroenterology;  Laterality: N/A;   ESOPHAGOGASTRODUODENOSCOPY Left 03/11/2019   Procedure: ESOPHAGOGASTRODUODENOSCOPY (EGD);  Surgeon: Lavena Bullion, DO;  Location: [facility] ENDOSCOPY;  Service: Gastroenterology;  Laterality: Left;   LEFT AND RIGHT HEART CATHETERIZATION WITH CORONARY ANGIOGRAM N/A 11/06/2014   Procedure: LEFT AND RIGHT HEART CATHETERIZATION WITH CORONARY ANGIOGRAM;  Surgeon: Jettie Booze, MD;  Location: [facility] CATH LAB;  Service: Cardiovascular;  Laterality: N/A;   RIGHT/LEFT HEART CATH AND CORONARY ANGIOGRAPHY N/A 12/06/2018   Procedure: RIGHT/LEFT HEART CATH AND CORONARY ANGIOGRAPHY;   Surgeon: Larey Dresser, MD;  Location: [place] CV LAB;  Service: Cardiovascular;  Laterality: N/A;   SPLENECTOMY     SUBMUCOSAL TATTOO INJECTION  03/15/2019   Procedure: SUBMUCOSAL TATTOO INJECTION;  Surgeon: Irving Copas., MD;  Location: [place];  Service: Gastroenterology;;        Home Medications    Prior to Admission medications   Medication Sig Start Date End Date Taking? Authorizing Provider  albuterol (VENTOLIN HFA) 108 (90 Base) MCG/ACT inhaler Inhale 2 puffs into the lungs every 6 (six) hours as needed for wheezing or shortness of breath.    [provider]  apixaban (ELIQUIS) 5 MG TABS tablet Take 1 tablet (5 mg total) by mouth 2 (two) times daily. 03/31/19   Kayleen Memos, DO  bisoprolol (ZEBETA) 5 MG tablet Take 0.5 tablets (2.5 mg total) by mouth daily. 03/31/19   Kayleen Memos, DO  budesonide-formoterol (SYMBICORT) 160-4.5 MCG/ACT inhaler Inhale 2 puffs into the lungs 2 (two) times daily.    [provider]  diazepam (VALIUM) 5 MG tablet Take 5 mg by mouth daily as needed for anxiety.    [provider]  ferrous sulfate 325 (65 FE) MG tablet Take 1 tablet (  325 mg total) by mouth 2 (two) times daily with a meal. 03/31/19   Hall, Lorenda Cahill, DO  fluticasone (FLONASE) 50 MCG/ACT nasal spray Place 2 sprays into both nostrils daily as needed for allergies.     [provider]  gabapentin (NEURONTIN) 600 MG tablet Take 1 tablet (600 mg total) by mouth 2 (two) times daily. 01/24/19   Angiulli, Lavon Paganini, PA-C  guaifenesin (HUMIBID E) 400 MG TABS tablet Take 1 tablet (400 mg total) by mouth every 6 (six) hours as needed. Patient taking differently: Take 400 mg by mouth every 6 (six) hours as needed (cough).  08/02/18   Regalado, Belkys A, MD  hydrocortisone (ANUSOL-HC) 2.5 % rectal cream Place 1 application rectally 2 (two) times daily as needed for hemorrhoids or itching.     [provider]  hydrocortisone 1 % lotion Apply  1 application topically See admin instructions. Apply small amount to back twice daily for itchy rash    [provider]  insulin aspart (NOVOLOG) 100 UNIT/ML injection Inject 4 Units into the skin 3 (three) times daily with meals. 03/31/19   Kayleen Memos, DO  insulin aspart protamine - aspart (NOVOLOG MIX 70/30 FLEXPEN) (70-30) 100 UNIT/ML FlexPen Inject 0.1 mLs (10 Units total) into the skin 2 (two) times daily. 03/31/19   Kayleen Memos, DO  ipratropium-albuterol (DUONEB) 0.5-2.5 (3) MG/3ML SOLN Take 3 mLs by nebulization 2 (two) times daily as needed (shortness of breath/wheezing).     [provider]  isosorbide-hydrALAZINE (BIDIL) 20-37.5 MG tablet Take 0.5 tablets by mouth 3 (three) times daily. 03/31/19   Kayleen Memos, DO  latanoprost (XALATAN) 0.005 % ophthalmic solution Place 1 drop into both eyes at bedtime.    [provider]  metolazone (ZAROXOLYN) 2.5 MG tablet Take 1 tablet (2.5 mg total) by mouth every Wednesday. Take extra Potassium 75mq when taking this medication  **Please also take Metolazone 2.5 mg Every Friday and Take extra Potassium 231m when taking this medication.** Patient taking differently: Take 2.5 mg by mouth every Wednesday. Take extra Potassium 2046mwhen taking this medication  **Please also take Metolazone 2.5 mg Every Friday and Take extra Potassium 68m4mhen taking this medication.** 04/06/19   HallKayleen Memos  OXYGEN Inhale 3 L into the lungs continuous.     [provider]  pantoprazole (PROTONIX) 40 MG tablet Take 1 tablet (40 mg total) by mouth 2 (two) times daily. 01/24/19   Angiulli, DaniLavon Paganini-C  polyethylene glycol (MIRALAX / GLYCOLAX) packet Take 17 g by mouth daily. Patient taking differently: Take 17 g by mouth daily as needed (constipation).  01/24/19   Angiulli, DaniLavon Paganini-C  Potassium Chloride ER 20 MEQ TBCR Take 40 mEq by mouth 3 (three) times daily. 03/31/19   HallKayleen Memos  rosuvastatin (CRESTOR) 20  MG tablet Take 1 tablet (20 mg total) by mouth daily at 6 PM. 03/31/19   HallKayleen Memos  sodium chloride (OCEAN) 0.65 % SOLN nasal spray Place 2 sprays into both nostrils as needed for congestion.     [provider]  spironolactone (ALDACTONE) 25 MG tablet Take 1 tablet (25 mg total) by mouth daily. 03/31/19   HallKayleen Memos  torsemide (DEMADEX) 100 MG tablet Take 1 tablet (100 mg total) by mouth 2 (two) times daily. 03/31/19   HallKayleen Memos    Family History Family History  Problem Relation Age of Onset   Heart disease  Mother    Hypertension Mother    Heart failure Mother    Heart disease Father    Hypertension Sister    Heart attack Neg Hx    Stroke Neg Hx    Colon cancer Neg Hx    Esophageal cancer Neg Hx     Social History Social History   Tobacco Use   Smoking status: Former Smoker    Packs/day: 1.00    Years: 34.00    Pack years: 34.00    Types: Cigarettes    Last attempt to quit: 11/30/2018    Years since quitting: 0.3   Smokeless tobacco: Never Used  Substance Use Topics   Alcohol use: No    Alcohol/week: 0.0 standard drinks   Drug use: No     Allergies   Patient has no known allergies.   Review of Systems Review of Systems  All other systems reviewed and are negative.    Physical Exam Updated Vital Signs BP (!) 163/80 (BP Location: Left Arm)    Pulse 69    Temp 98.4 F (36.9 C) (Oral)    Resp 18    Ht 6' (1.829 m)    Wt 117.9 kg    SpO2 99%    BMI 35.25 kg/m   Physical Exam Vitals signs and nursing note reviewed.  Constitutional:      Appearance: He is well-developed. He is ill-appearing.  HENT:     Head: Normocephalic and atraumatic.  Cardiovascular:     Rate and Rhythm: Normal rate and regular rhythm.     Heart sounds: No murmur.  Pulmonary:     Effort: Pulmonary effort is normal. No respiratory distress.     Comments: Diffuse wheezing and decreased air movement bilaterally Abdominal:     Palpations:  Abdomen is soft.     Tenderness: There is no abdominal tenderness. There is no guarding or rebound.  Musculoskeletal:     Comments: Bandages to bilateral lower extremities  Skin:    General: Skin is warm and dry.  Neurological:     Mental Status: He is alert.     Comments: Abuse. Slow to answer questions. Disoriented to place, time, recent events. 4 to 5 grip strength and bilateral upper extremities.   Psychiatric:     Comments: Unable to assess      ED Treatments / Results  Labs (all labs ordered are listed, but only abnormal results are displayed) Labs Reviewed  COMPREHENSIVE METABOLIC PANEL - Abnormal; Notable for the following components:      Result Value   Potassium >7.5 (*)    Glucose, Bld 110 (*)    BUN 35 (*)    Creatinine, Ser 1.36 (*)    Albumin 3.4 (*)    Alkaline Phosphatase 171 (*)    Total Bilirubin 0.2 (*)    GFR calc non Af Amer 52 (*)    All other components within normal limits  CBC WITH DIFFERENTIAL/PLATELET - Abnormal; Notable for the following components:   RBC 3.92 (*)    Hemoglobin 9.9 (*)    HCT 37.3 (*)    MCH 25.3 (*)    MCHC 26.5 (*)    RDW 21.6 (*)    nRBC 0.5 (*)    All other components within normal limits  BLOOD GAS, VENOUS - Abnormal; Notable for the following components:   pCO2, Ven 62.9 (*)    pO2, Ven 175.0 (*)    All other components within normal limits  URINALYSIS, ROUTINE W REFLEX  MICROSCOPIC - Abnormal; Notable for the following components:   Color, Urine STRAW (*)    All other components within normal limits  POTASSIUM - Abnormal; Notable for the following components:   Potassium >7.5 (*)    All other components within normal limits  I-STAT CHEM 8, ED - Abnormal; Notable for the following components:   Potassium 7.7 (*)    BUN 33 (*)    Creatinine, Ser 1.40 (*)    Glucose, Bld 110 (*)    Hemoglobin 11.6 (*)    HCT 34.0 (*)    All other components within normal limits  CBG MONITORING, ED - Abnormal; Notable for the  following components:   Glucose-Capillary 108 (*)    All other components within normal limits  SARS CORONAVIRUS 2 ([facility])  CULTURE, BLOOD (ROUTINE X 2)  CULTURE, BLOOD (ROUTINE X 2)  URINE CULTURE  TROPONIN I  LACTIC ACID, PLASMA  LACTIC ACID, PLASMA  GLUCOSE, RANDOM  POTASSIUM  BASIC METABOLIC PANEL  GAMMA GT  PROCALCITONIN  BRAIN NATRIURETIC PEPTIDE  TSH  VITAMIN B12  AMMONIA  BLOOD GAS, ARTERIAL    EKG EKG Interpretation  Date/Time:  Saturday April 09 2019 19:21:13 EDT Ventricular Rate:  71 PR Interval:    QRS Duration: 127 QT Interval:  409 QTC Calculation: 445 R Axis:   116 Text Interpretation:  Sinus rhythm Borderline prolonged PR interval IVCD, consider atypical RBBB Nonspecific T abnormalities, lateral leads Confirmed by Quintella Reichert [phone]) on 04/09/2019 7:23:43 PM   Radiology Ct Head Wo Contrast  Result Date: 04/09/2019 CLINICAL DATA:  Altered level of consciousness over the past 2 days. Bladder infection 2 weeks ago. Not taking antibiotics. EXAM: CT HEAD WITHOUT CONTRAST TECHNIQUE: Contiguous axial images were obtained from the base of the skull through the vertex without intravenous contrast. COMPARISON:  CT 03/18/2019 and MRI 03/20/2019 FINDINGS: Brain: Ventricles, cisterns and other CSF spaces are within normal. Cavum septum variant is present. Subtle chronic ischemic microvascular disease. Stable old small cerebellar infarcts. No mass, mass effect, shift of midline structures or acute hemorrhage. No evidence of acute infarction. Vascular: No hyperdense vessel or unexpected calcification. Skull: Normal. Negative for fracture or focal lesion. Sinuses/Orbits: Orbits are normal symmetric. Paranasal sinuses are normal. Other: None. IMPRESSION: No acute findings. Minimal chronic ischemic microvascular disease. Small old lacunar cerebellar infarcts. Electronically Signed   By: Marin Olp M.D.   On: 04/09/2019 21:15    Dg Chest Port 1 View  Result Date: 04/09/2019 CLINICAL DATA:  73 year old male with history of bladder infection 2 weeks ago. EXAM: PORTABLE CHEST 1 VIEW COMPARISON:  Chest x-ray 03/25/2019. FINDINGS: Previously noted right upper extremity PICC has been removed. Diffuse haziness and patchy interstitial opacities throughout the lungs bilaterally. Confluent opacity in the lung bases bilaterally (left greater than right), likely to reflect moderate to large left and small to moderate right pleural effusions with associated areas of bibasilar atelectasis and/or airspace consolidation. Enlargement of the cardiopericardial silhouette which appears very indistinct. Pulmonary vasculature is obscured. The patient is rotated to the left on today's exam, resulting in distortion of the mediastinal contours and reduced diagnostic sensitivity and specificity for mediastinal pathology. Aortic atherosclerosis. IMPRESSION: 1. Moderate to large left and small to moderate right pleural effusions with bibasilar areas of atelectasis and/or consolidation. 2. Cardiomegaly with probable pulmonary edema, suggestive of congestive heart failure. 3. Aortic atherosclerosis. Electronically Signed   By: Vinnie Langton M.D.   On: 04/09/2019 21:20  Procedures Procedures (including critical care time) CRITICAL CARE Performed by: Quintella Reichert   Total critical care time: 45 minutes  Critical care time was exclusive of separately billable procedures and treating other patients.  Critical care was necessary to treat or prevent imminent or life-threatening deterioration.  Critical care was time spent personally by me on the following activities: development of treatment plan with patient and/or surrogate as well as nursing, discussions with consultants, evaluation of patient's response to treatment, examination of patient, obtaining history from patient or surrogate, ordering and performing treatments and interventions, ordering  and review of laboratory studies, ordering and review of radiographic studies, pulse oximetry and re-evaluation of patient's condition.   Medications Ordered in ED Medications  acetaminophen (TYLENOL) tablet 650 mg (has no administration in time range)    Or  acetaminophen (TYLENOL) suppository 650 mg (has no administration in time range)  furosemide (LASIX) injection 40 mg (has no administration in time range)  insulin aspart (novoLOG) injection 0-9 Units (has no administration in time range)  methylPREDNISolone sodium succinate (SOLU-MEDROL) 125 mg/2 mL injection 60 mg (has no administration in time range)  ipratropium-albuterol (DUONEB) 0.5-2.5 (3) MG/3ML nebulizer solution 3 mL (has no administration in time range)  albuterol (PROVENTIL) (2.5 MG/3ML) 0.083% nebulizer solution 2.5 mg (has no administration in time range)  ceFEPIme (MAXIPIME) 2 g in sodium chloride 0.9 % 100 mL IVPB (has no administration in time range)  apixaban (ELIQUIS) tablet 5 mg (has no administration in time range)  bisoprolol (ZEBETA) tablet 2.5 mg (has no administration in time range)  albuterol (VENTOLIN HFA) 108 (90 Base) MCG/ACT inhaler 4 puff (4 puffs Inhalation Given 04/09/19 2107)  albuterol (VENTOLIN HFA) 108 (90 Base) MCG/ACT inhaler 2 puff (2 puffs Inhalation Given 04/09/19 2108)  furosemide (LASIX) injection 40 mg (40 mg Intravenous Given 04/09/19 2229)  methylPREDNISolone sodium succinate (SOLU-MEDROL) 125 mg/2 mL injection 125 mg (125 mg Intravenous Given 04/09/19 2231)  ceFEPIme (MAXIPIME) 2 g in sodium chloride 0.9 % 100 mL IVPB (0 g Intravenous Stopped 04/09/19 2304)  calcium gluconate 1 g/ 50 mL sodium chloride IVPB (1,000 mg Intravenous New Bag/Given 04/09/19 2356)  sodium bicarbonate injection 50 mEq (50 mEq Intravenous Given 04/09/19 2348)  insulin aspart (novoLOG) injection 10 Units (10 Units Intravenous Given 04/09/19 2355)    And  dextrose 50 % solution 50 mL (50 mLs Intravenous Given 04/09/19 2353)      Initial Impression / Assessment and Plan / ED Course  I have reviewed the triage vital signs and the nursing notes.  Pertinent labs & imaging results that were available during my care of the patient were reviewed by me and considered in my medical decision making (see chart for details).       Additional hx available from wife two hours after his initial ED arrival.  Today he refused to take his medicine today, possibly yesterday as well.  She states he is normally a little slow to answer questions.   Patient with history of COPD on home oxygen as well as CHF and CKD here for evaluation of medication noncompliance and altered mental status. He is confused on examination with increased work of breathing. Wheezing on initial examination. He was treated with albuterol with no significant change in his symptoms. Chest x-ray with pulmonary edema and pleural effusions. He was treated with BiPAP for possible coexistent CHF exacerbation. He was also treated with Lasix. Initial labs demonstrate hyperkalemia but otherwise his renal function is near his baseline. Initial thought that  this was a possible lab error and repeat potassium was obtained. Repeat potassium is consistent with profound hyperkalemia. He was treated with temporizing measures. Hospitalist consulted for admission for further management of his multiple issues.  Final Clinical Impressions(s) / ED Diagnoses   Final diagnoses:  Altered mental status, unspecified altered mental status type  Acute on chronic respiratory failure with hypoxia and hypercapnia ([place])  Hyperkalemia    ED Discharge Orders    None       Quintella Reichert, MD 04/10/19 [phone]

## 2019-04-09 NOTE — ED Notes (Signed)
Date and time results received: 04/09/19 9:25 PM  (use smartphrase ".now" to insert current time)  Test:Potassium Critical Value:>7.5  Name of Provider Notified: Dr.Rees  Orders Received? Or Actions Taken?: recollect potassium

## 2019-04-09 NOTE — ED Notes (Signed)
Bed: WA10 Expected date: 04/09/19 Expected time: 6:44 PM Means of arrival: Ambulance Comments: AMS

## 2019-04-09 NOTE — ED Notes (Signed)
Patient transported to CT 

## 2019-04-09 NOTE — H&P (Addendum)
History and Physical    Rigo Letts ZOX:[id number] DOB: January 27, 1946 DOA: 04/09/2019  PCP: [facility] Patient coming from: Home  Chief Complaint: Altered mental status  HPI: Levis Nazir is a 73 y.o. male with medical history significant of atrial flutter, paroxysmal atrial fibrillation, CAD, chronic systolic congestive heart failure, COPD on 2-3 L home oxygen, type 2 diabetes, hypertension, hyperlipidemia presenting to the hospital for evaluation of altered mental status.  History provided by EMS.  Per report, he has been altered for the last 2 days.  He was recently hospitalized and treated for a UTI.  He has been off of his antibiotic for the last 2 days.  Wife reported that patient refused to take his medications today and possibly yesterday as well.  He is normally a little slow to answer questions.  Patient is oriented to self only.  No history could be obtained from him.  ED Course: Afebrile.  Initially on 3 L supplemental oxygen, subsequently required BiPAP for increased WOB. Noted to be wheezing. No leukocytosis.  Lactic acid normal.  Potassium >7.5.  EKG without acute changes.  Creatinine 1.3, improved since prior labs.  UA and urine culture pending.  Blood culture x2 pending.  COVID-19 rapid test negative. Chest x-ray showing moderate to large left and small-to-moderate right pleural effusions with bibasilar areas of atelectasis and/or consolidation.  Also showing cardiomegaly with probable pulmonary edema suggestive of CHF. Head CT negative for acute finding. Patient received albuterol, IV Lasix 40 mg, Solu-Medrol 125 mg, and cefepime in the ED.   Review of Systems:  All systems reviewed and apart from history of presenting illness, are negative.  Past Medical History:  Diagnosis Date   Atrial flutter ([place])    a. recurrent AFlutter with RVR;  b. Amiodarone Rx started 4/16   CAD (coronary artery disease)    a. LHC 1/16:  mLAD diffuse disease, pLCx mild disease, dLCx  with disease but too small for PCI, RCA ok, EF 25-30%   Chronic pain    Chronic systolic CHF (congestive heart failure) (HCC)    COPD (chronic obstructive pulmonary disease) ([place])    Diabetes mellitus without complication ([place])    Hypercholesteremia    Hypertension    NICM (nonischemic cardiomyopathy) ([place])    a.dx 2016. b. 2D echo 06/2016 - Last echo 07/01/16: mod dilated LV, mod LVH, EF 25-30%, mild-mod MR, sev LAE, mild-mod reduced RV systolic function, mild-mod TR, PASP 33mHG.   PAF (paroxysmal atrial fibrillation) (HCC)    On amio - ot a candidate for flecainide due to cardiomyopathy, not a candidate for Tikosyn due to prolonged QT, and felt to be a poor candidate for ablation given left atrial size.   Pulmonary hypertension ([facility]    Tobacco abuse     Past Surgical History:  Procedure Laterality Date   BIOPSY  03/11/2019   Procedure: BIOPSY;  Surgeon: CLavena Bullion DO;  [facility]  Service: Gastroenterology;;   BIOPSY  03/15/2019   Procedure: BIOPSY;  Surgeon: MIrving Copas, MD;  [place] [facility]  Service: Gastroenterology;;   CARDIOVERSION N/A 07/30/2018   Procedure: CARDIOVERSION;  Surgeon: MLarey Dresser MD;  [facility]  Service: Cardiovascular;  Laterality: N/A;   COLONOSCOPY N/A 03/11/2019   Procedure: COLONOSCOPY;  Surgeon: CLavena Bullion DO;  [place] [facility]  Service: Gastroenterology;  Laterality: N/A;   ENTEROSCOPY N/A 03/15/2019   Procedure: ENTEROSCOPY;  Surgeon: MRush LandmarkGTelford Nab, MD;  [place] [facility]  Service: Gastroenterology;  Laterality: N/A;   ESOPHAGOGASTRODUODENOSCOPY Left 03/11/2019   Procedure: ESOPHAGOGASTRODUODENOSCOPY (EGD);  Surgeon: Lavena Bullion, DO;  Location[facility] ENDOSCOPY;  Service: Gastroenterology;  Laterality: Left;   LEFT AND RIGHT HEART CATHETERIZATION WITH CORONARY ANGIOGRAM N/A 11/06/2014   Procedure: LEFT AND RIGHT HEART CATHETERIZATION WITH CORONARY  ANGIOGRAM;  Surgeon: Jettie Booze, MD;  Location: [facility] CATH LAB;  Service: Cardiovascular;  Laterality: N/A;   RIGHT/LEFT HEART CATH AND CORONARY ANGIOGRAPHY N/A 12/06/2018   Procedure: RIGHT/LEFT HEART CATH AND CORONARY ANGIOGRAPHY;  Surgeon: Larey Dresser, MD;  Location: [place] CV LAB;  Service: Cardiovascular;  Laterality: N/A;   SPLENECTOMY     SUBMUCOSAL TATTOO INJECTION  03/15/2019   Procedure: SUBMUCOSAL TATTOO INJECTION;  Surgeon: Irving Copas., MD;  Location: [place];  Service: Gastroenterology;;     reports that he quit smoking about 4 months ago. His smoking use included cigarettes. He has a 34.00 pack-year smoking history. He has never used smokeless tobacco. He reports that he does not drink alcohol or use drugs.  No Known Allergies  Family History  Problem Relation Age of Onset   Heart disease Mother    Hypertension Mother    Heart failure Mother    Heart disease Father    Hypertension Sister    Heart attack Neg Hx    Stroke Neg Hx    Colon cancer Neg Hx    Esophageal cancer Neg Hx     Prior to Admission medications   Medication Sig Start Date End Date Taking? Authorizing Provider  albuterol (VENTOLIN HFA) 108 (90 Base) MCG/ACT inhaler Inhale 2 puffs into the lungs every 6 (six) hours as needed for wheezing or shortness of breath.    [provider]  apixaban (ELIQUIS) 5 MG TABS tablet Take 1 tablet (5 mg total) by mouth 2 (two) times daily. 03/31/19   Kayleen Memos, DO  bisoprolol (ZEBETA) 5 MG tablet Take 0.5 tablets (2.5 mg total) by mouth daily. 03/31/19   Kayleen Memos, DO  budesonide-formoterol (SYMBICORT) 160-4.5 MCG/ACT inhaler Inhale 2 puffs into the lungs 2 (two) times daily.    [provider]  diazepam (VALIUM) 5 MG tablet Take 5 mg by mouth daily as needed for anxiety.    [provider]  ferrous sulfate 325 (65 FE) MG tablet Take 1 tablet (325 mg total) by mouth 2 (two) times daily with a meal.  03/31/19   Hall, Carole N, DO  fluticasone (FLONASE) 50 MCG/ACT nasal spray Place 2 sprays into both nostrils daily as needed for allergies.     [provider]  gabapentin (NEURONTIN) 600 MG tablet Take 1 tablet (600 mg total) by mouth 2 (two) times daily. 01/24/19   Angiulli, Lavon Paganini, PA-C  guaifenesin (HUMIBID E) 400 MG TABS tablet Take 1 tablet (400 mg total) by mouth every 6 (six) hours as needed. Patient taking differently: Take 400 mg by mouth every 6 (six) hours as needed (cough).  08/02/18   Regalado, Belkys A, MD  hydrocortisone (ANUSOL-HC) 2.5 % rectal cream Place 1 application rectally 2 (two) times daily as needed for hemorrhoids or itching.     [provider]  hydrocortisone 1 % lotion Apply 1 application topically See admin instructions. Apply small amount to back twice daily for itchy rash    [provider]  insulin aspart (NOVOLOG) 100 UNIT/ML injection Inject 4 Units into the skin 3 (three) times daily with meals. 03/31/19   Kayleen Memos, DO  insulin  aspart protamine - aspart (NOVOLOG MIX 70/30 FLEXPEN) (70-30) 100 UNIT/ML FlexPen Inject 0.1 mLs (10 Units total) into the skin 2 (two) times daily. 03/31/19   Kayleen Memos, DO  ipratropium-albuterol (DUONEB) 0.5-2.5 (3) MG/3ML SOLN Take 3 mLs by nebulization 2 (two) times daily as needed (shortness of breath/wheezing).     [provider]  isosorbide-hydrALAZINE (BIDIL) 20-37.5 MG tablet Take 0.5 tablets by mouth 3 (three) times daily. 03/31/19   Kayleen Memos, DO  latanoprost (XALATAN) 0.005 % ophthalmic solution Place 1 drop into both eyes at bedtime.    [provider]  metolazone (ZAROXOLYN) 2.5 MG tablet Take 1 tablet (2.5 mg total) by mouth every Wednesday. Take extra Potassium 56mq when taking this medication  **Please also take Metolazone 2.5 mg Every Friday and Take extra Potassium 250m when taking this medication.** Patient taking differently: Take 2.5 mg by mouth every  Wednesday. Take extra Potassium 2035mwhen taking this medication  **Please also take Metolazone 2.5 mg Every Friday and Take extra Potassium 78m72mhen taking this medication.** 04/06/19   HallKayleen Memos  OXYGEN Inhale 3 L into the lungs continuous.     [provider]  pantoprazole (PROTONIX) 40 MG tablet Take 1 tablet (40 mg total) by mouth 2 (two) times daily. 01/24/19   Angiulli, DaniLavon Paganini-C  polyethylene glycol (MIRALAX / GLYCOLAX) packet Take 17 g by mouth daily. Patient taking differently: Take 17 g by mouth daily as needed (constipation).  01/24/19   Angiulli, DaniLavon Paganini-C  Potassium Chloride ER 20 MEQ TBCR Take 40 mEq by mouth 3 (three) times daily. 03/31/19   HallKayleen Memos  rosuvastatin (CRESTOR) 20 MG tablet Take 1 tablet (20 mg total) by mouth daily at 6 PM. 03/31/19   HallKayleen Memos  sodium chloride (OCEAN) 0.65 % SOLN nasal spray Place 2 sprays into both nostrils as needed for congestion.     [provider]  spironolactone (ALDACTONE) 25 MG tablet Take 1 tablet (25 mg total) by mouth daily. 03/31/19   HallKayleen Memos  torsemide (DEMADEX) 100 MG tablet Take 1 tablet (100 mg total) by mouth 2 (two) times daily. 03/31/19   HallKayleen Memos    Physical Exam: Vitals:   04/09/19 2219 04/09/19 2230 04/09/19 2330 04/09/19 2346  BP: 134/73 136/86 (!) 163/80 (!) 163/80  Pulse: 70 68 73 69  Resp: (!) 21 (!) 23  18  Temp:      TempSrc:      SpO2: 97% 96% (!) 68% 99%  Weight:      Height:        Physical Exam  Constitutional: He appears well-developed and well-nourished. No distress.  HENT:  Head: Normocephalic.  Eyes:  Unable to assess pupillary reaction due to lack of patient cooperation  Neck: Neck supple. No JVD present.  Cardiovascular: Normal rate, regular rhythm and intact distal pulses.  Pulmonary/Chest: He is in respiratory distress. He has no wheezes. He has no rales.  On BiPAP Equal air entry bilaterally  Abdominal: Soft. Bowel  sounds are normal. He exhibits no distension. There is no abdominal tenderness. There is no guarding.  Musculoskeletal:        General: No edema.  Neurological:  Slightly somnolent but waking up and following commands Oriented to self only Strength 5 out of 5 in bilateral upper and lower extremities  Skin: Skin is warm and dry. He is not diaphoretic.     Labs  on Admission: I have personally reviewed following labs and imaging studies  CBC: Recent Labs  Lab 04/09/19 1959 04/09/19 2017  WBC 6.0  --   NEUTROABS 4.2  --   HGB 9.9* 11.6*  HCT 37.3* 34.0*  MCV 95.2  --   PLT 319  --    Basic Metabolic Panel: Recent Labs  Lab 04/09/19 1959 04/09/19 2017 04/09/19 2226  NA 140 140  --   K >7.5* 7.7* >7.5*  CL 108 111  --   CO2 27  --   --   GLUCOSE 110* 110*  --   BUN 35* 33*  --   CREATININE 1.36* 1.40*  --   CALCIUM 8.9  --   --    GFR: Estimated Creatinine Clearance: 65.1 mL/min (A) (by C-G formula based on SCr of 1.4 mg/dL (H)). Liver Function Tests: Recent Labs  Lab 04/09/19 1959  AST 19  ALT 25  ALKPHOS 171*  BILITOT 0.2*  PROT 7.5  ALBUMIN 3.4*   No results for input(s): LIPASE, AMYLASE in the last 168 hours. No results for input(s): AMMONIA in the last 168 hours. Coagulation Profile: No results for input(s): INR, PROTIME in the last 168 hours. Cardiac Enzymes: Recent Labs  Lab 04/09/19 1959  TROPONINI <0.03   BNP (last 3 results) No results for input(s): PROBNP in the last 8760 hours. HbA1C: No results for input(s): HGBA1C in the last 72 hours. CBG: Recent Labs  Lab 04/09/19 1925  GLUCAP 108*   Lipid Profile: No results for input(s): CHOL, HDL, LDLCALC, TRIG, CHOLHDL, LDLDIRECT in the last 72 hours. Thyroid Function Tests: No results for input(s): TSH, T4TOTAL, FREET4, T3FREE, THYROIDAB in the last 72 hours. Anemia Panel: No results for input(s): VITAMINB12, FOLATE, FERRITIN, TIBC, IRON, RETICCTPCT in the last 72 hours. Urine analysis:      Component Value Date/Time   COLORURINE STRAW (A) 04/09/2019 2328   APPEARANCEUR CLEAR 04/09/2019 2328   LABSPEC 1.009 04/09/2019 2328   PHURINE 5.0 04/09/2019 2328   GLUCOSEU NEGATIVE 04/09/2019 2328   HGBUR NEGATIVE 04/09/2019 2328   BILIRUBINUR NEGATIVE 04/09/2019 [place] 04/09/2019 2328   PROTEINUR NEGATIVE 04/09/2019 2328   UROBILINOGEN 0.2 10/26/2014 2206   NITRITE NEGATIVE 04/09/2019 2328   LEUKOCYTESUR NEGATIVE 04/09/2019 2328    Radiological Exams on Admission: Ct Head Wo Contrast  Result Date: 04/09/2019 CLINICAL DATA:  Altered level of consciousness over the past 2 days. Bladder infection 2 weeks ago. Not taking antibiotics. EXAM: CT HEAD WITHOUT CONTRAST TECHNIQUE: Contiguous axial images were obtained from the base of the skull through the vertex without intravenous contrast. COMPARISON:  CT 03/18/2019 and MRI 03/20/2019 FINDINGS: Brain: Ventricles, cisterns and other CSF spaces are within normal. Cavum septum variant is present. Subtle chronic ischemic microvascular disease. Stable old small cerebellar infarcts. No mass, mass effect, shift of midline structures or acute hemorrhage. No evidence of acute infarction. Vascular: No hyperdense vessel or unexpected calcification. Skull: Normal. Negative for fracture or focal lesion. Sinuses/Orbits: Orbits are normal symmetric. Paranasal sinuses are normal. Other: None. IMPRESSION: No acute findings. Minimal chronic ischemic microvascular disease. Small old lacunar cerebellar infarcts. Electronically Signed   By: Marin Olp M.D.   On: 04/09/2019 21:15   Dg Chest Port 1 View  Result Date: 04/09/2019 CLINICAL DATA:  73 year old male with history of bladder infection 2 weeks ago. EXAM: PORTABLE CHEST 1 VIEW COMPARISON:  Chest x-ray 03/25/2019. FINDINGS: Previously noted right upper extremity PICC has been removed. Diffuse haziness and  patchy interstitial opacities throughout the lungs bilaterally. Confluent opacity in the  lung bases bilaterally (left greater than right), likely to reflect moderate to large left and small to moderate right pleural effusions with associated areas of bibasilar atelectasis and/or airspace consolidation. Enlargement of the cardiopericardial silhouette which appears very indistinct. Pulmonary vasculature is obscured. The patient is rotated to the left on today's exam, resulting in distortion of the mediastinal contours and reduced diagnostic sensitivity and specificity for mediastinal pathology. Aortic atherosclerosis. IMPRESSION: 1. Moderate to large left and small to moderate right pleural effusions with bibasilar areas of atelectasis and/or consolidation. 2. Cardiomegaly with probable pulmonary edema, suggestive of congestive heart failure. 3. Aortic atherosclerosis. Electronically Signed   By: Vinnie Langton M.D.   On: 04/09/2019 21:20    EKG: Independently reviewed.  Sinus rhythm, nonspecific T wave abnormality.  No peak T waves.  Assessment/Plan Principal Problem:   Acute respiratory failure (HCC) Active Problems:   PAF (paroxysmal atrial fibrillation) (HCC)   Acute encephalopathy   Diabetes mellitus type 2 in obese (HCC)   Hyperkalemia  Acute on chronic hypoxic respiratory failure secondary to acute COPD exacerbation, acute exacerbation of chronic systolic CHF, and ?CAP Patient uses 2 to 3 L supplemental oxygen at home.  On arrival to the ED, was on 3 L supplemental oxygen but subsequently required BiPAP for increased work of breathing.  Wheezing on arrival to the ED.  Afebrile.  No leukocytosis and lactic acid normal.  COVID-19 rapid test negative.  Chest x-ray showing moderate to large left and small-to-moderate right pleural effusions with bibasilar areas of atelectasis and/or consolidation.  Also showing cardiomegaly with probable pulmonary edema suggestive of CHF.  Received albuterol, IV Lasix 40 mg, and Solu-Medrol 125 mg. -Monitor in the stepdown unit -Continue Solu-Medrol  60 mg every 8 hours.  Duo nebs every 6 hours.  Albuterol neb every 2 hours as needed. -Continue IV Lasix 40 mg twice daily.  Check BNP.  Monitor intake/ output, daily weights, and fluid/sodium restriction.. -Continue cefepime.  Check procalcitonin level. -Patient will need a CT scan tomorrow to better assess the pleural effusions and need for thoracentesis. -BiPAP as needed, supplemental oxygen  Severe hyperkalemia Unclear etiology.  Potassium >7.5.  EKG without acute changes.   -Calcium gluconate, insulin, D50, sodium bicarb -Repeat labs and continue to monitor potassium level very closely  Addendum: Spironolactone and potassium supplement listed in home medications.  Unclear whether patient is currently them.  Repeat potassium after medical treatment slightly improved to 6.9.  Discussed with nephrology.  Recommending Lokelma 20 g and an additional round of calcium gluconate, insulin, D50, and sodium bicarb.  Repeat labs and continue to monitor very closely.  Continue to monitor on telemetry and serial EKGs.  Acute encephalopathy Possibly due to hypoxia/hypercapnia.  ABG not done in the ED.  Currently oriented to self only.  Slightly somnolent but waking up and following commands appropriately.  No focal neuro deficit.  Head CT negative for acute finding. -Check TSH, B12, ammonia -Patient will need a brain MRI if no improvement in mental status by tomorrow morning -Closely monitor for mental status changes -Frequent neurochecks -Check ABG  Addendum: ABG with evidence of hypoxia and hypercapnia.  Continues to be on BiPAP and adjustments have been made.  Ammonia level elevated at 67.  No documented history of cirrhosis.  AST and ALT normal.  Give lactulose.  Chronic anemia -Stable. Hemoglobin 9.9, baseline in the 8-9 range.  Elevated alkaline phosphatase Alkaline phosphatase 171, remainder of  LFTs normal. -Check GGT level  Well-controlled type 2 diabetes A1c 6.8 on Mar 22, 2019. -Sliding scale insulin and CBG checks  Paroxysmal atrial fibrillation -Currently rate controlled.  Continue home bisoprolol and Eliquis.  Unable to safely order remainder of home medications at this time as pharmacy medication reconciliation is pending.  DVT prophylaxis: Eliquis Code Status: Full code Family Communication: No family available. Disposition Plan: Anticipate discharge after clinical improvement. Consults called: Nephrology (Dr. Moshe Cipro) Admission status: It is my clinical opinion that admission to INPATIENT is reasonable and necessary in this 73 y.o. male  presenting with acute encephalopathy, acute on chronic hypoxic respiratory failure secondary to acute exacerbation of chronic systolic congestive heart failure and acute COPD exacerbation  in the context of PMH including: CHF, COPD  with pertinent positives on physical exam including: Wheezing and increased work of breathing in the ED.  Currently requiring BiPAP.  and pertinent positives on radiographic and laboratory data including: Imaging with evidence of CHF exacerbation and pleural effusions.  Labs showing severe hyperkalemia.  Workup and treatment include BiPAP, IV diuresis, IV antibiotic, nebulizer treatments.  Monitoring labs closely and ensuring resolution of severe hyperkalemia.  Given the aforementioned, the predictability of an adverse outcome is felt to be significant. I expect that the patient will require at least 2 midnights in the hospital to treat this condition.   The medical decision making on this patient was of high complexity and the patient is at high risk for clinical deterioration, therefore this is a level 3 visit.  Shela Leff MD [facility] Pager [phone]  If 7PM-7AM, please contact night-coverage [email or web address] Password [email or web address]  04/10/2019, 12:19 AM

## 2019-04-09 NOTE — ED Notes (Signed)
PT wife called regarding pt condition and was advised that tests were still being run. Pt spouse stated that he had been more confused over the last 2 weeks and had not eaten today.

## 2019-04-09 NOTE — ED Triage Notes (Signed)
Per EMS, pt's wife reports he has been altered for the past 2 days. Pt had bladder infection 2 weeks ago. Pt's wife says he has not been taking his antibiotic for the past 2 days. Pt is alert to person, wife states he is usually alert and oriented. Wife states he has been declining over the past 2 weeks.  CBG 116 BP 128/64 HR 72 SpO2 100 on 4L, is usually on 2-3Ls at home, was satting in upper 80's when fire department arrived.

## 2019-04-10 DIAGNOSIS — E875 Hyperkalemia: Secondary | ICD-10-CM

## 2019-04-10 LAB — BLOOD GAS, ARTERIAL
Acid-Base Excess: 2.2 mmol/L — ABNORMAL HIGH (ref 0.0–2.0)
Acid-Base Excess: 2.6 mmol/L — ABNORMAL HIGH (ref 0.0–2.0)
Bicarbonate: 29.5 mmol/L — ABNORMAL HIGH (ref 20.0–28.0)
Bicarbonate: 30 mmol/L — ABNORMAL HIGH (ref 20.0–28.0)
Delivery systems: POSITIVE
Delivery systems: POSITIVE
Drawn by: 308601
Drawn by: 308601
Expiratory PAP: 7
Expiratory PAP: 7
FIO2: 30
FIO2: 45
Inspiratory PAP: 14
Inspiratory PAP: 20
Mode: POSITIVE
Mode: POSITIVE
O2 Saturation: 86.6 %
O2 Saturation: 95.8 %
Patient temperature: 98.6
Patient temperature: 37
pCO2 arterial: 65.6 mmHg (ref 32.0–48.0)
pCO2 arterial: 66.5 mmHg (ref 32.0–48.0)
pH, Arterial: 7.276 — ABNORMAL LOW (ref 7.350–7.450)
pH, Arterial: 7.277 — ABNORMAL LOW (ref 7.350–7.450)
pO2, Arterial: 55 mmHg — ABNORMAL LOW (ref 83.0–108.0)
pO2, Arterial: 83.5 mmHg (ref 83.0–108.0)

## 2019-04-10 LAB — GLUCOSE, CAPILLARY
Glucose-Capillary: 97 mg/dL (ref 70–99)
Glucose-Capillary: 149 mg/dL — ABNORMAL HIGH (ref 70–99)
Glucose-Capillary: 131 mg/dL — ABNORMAL HIGH (ref 70–99)
Glucose-Capillary: 155 mg/dL — ABNORMAL HIGH (ref 70–99)

## 2019-04-10 LAB — BASIC METABOLIC PANEL
Anion gap: 12 (ref 5–15)
Anion gap: 6 (ref 5–15)
BUN: 31 mg/dL — ABNORMAL HIGH (ref 8–23)
BUN: 33 mg/dL — ABNORMAL HIGH (ref 8–23)
CO2: 24 mmol/L (ref 22–32)
CO2: 28 mmol/L (ref 22–32)
Calcium: 9.4 mg/dL (ref 8.9–10.3)
Calcium: 9.4 mg/dL (ref 8.9–10.3)
Chloride: 110 mmol/L (ref 98–111)
Chloride: 110 mmol/L (ref 98–111)
Creatinine, Ser: 1.41 mg/dL — ABNORMAL HIGH (ref 0.61–1.24)
Creatinine, Ser: 1.32 mg/dL — ABNORMAL HIGH (ref 0.61–1.24)
GFR calc Af Amer: 58 mL/min — ABNORMAL LOW (ref >60)
GFR calc Af Amer: >60 mL/min (ref >60)
GFR calc non Af Amer: 50 mL/min — ABNORMAL LOW (ref >60)
GFR calc non Af Amer: 54 mL/min — ABNORMAL LOW (ref >60)
Glucose, Bld: 101 mg/dL — ABNORMAL HIGH (ref 70–99)
Glucose, Bld: 121 mg/dL — ABNORMAL HIGH (ref 70–99)
Potassium: 7 mmol/L (ref 3.5–5.1)
Potassium: 7.3 mmol/L (ref 3.5–5.1)
Sodium: 146 mmol/L — ABNORMAL HIGH (ref 135–145)
Sodium: 144 mmol/L (ref 135–145)

## 2019-04-10 LAB — MRSA PCR SCREENING: MRSA by PCR: NEGATIVE

## 2019-04-10 LAB — APTT: aPTT: 29 seconds (ref 24–36)

## 2019-04-10 LAB — BRAIN NATRIURETIC PEPTIDE: B Natriuretic Peptide: 1843.4 pg/mL — ABNORMAL HIGH (ref 0.0–100.0)

## 2019-04-10 LAB — TSH: TSH: 0.587 u[IU]/mL (ref 0.350–4.500)

## 2019-04-10 LAB — POTASSIUM
Potassium: 6.9 mmol/L (ref 3.5–5.1)
Potassium: 6.3 mmol/L (ref 3.5–5.1)
Potassium: 5.2 mmol/L — ABNORMAL HIGH (ref 3.5–5.1)

## 2019-04-10 LAB — TROPONIN I
Troponin I: 0.03 ng/mL (ref <0.03)
Troponin I: 0.03 ng/mL (ref <0.03)

## 2019-04-10 LAB — VITAMIN B12: Vitamin B-12: 337 pg/mL (ref 180–914)

## 2019-04-10 LAB — GAMMA GT: GGT: 111 U/L — ABNORMAL HIGH (ref 7–50)

## 2019-04-10 LAB — PROCALCITONIN: Procalcitonin: 0.16 ng/mL

## 2019-04-10 LAB — AMMONIA: Ammonia: 67 umol/L — ABNORMAL HIGH (ref 9–35)

## 2019-04-10 LAB — HEPARIN LEVEL (UNFRACTIONATED): Heparin Unfractionated: 0.79 IU/mL — ABNORMAL HIGH (ref 0.30–0.70)

## 2019-04-10 MED ORDER — MUPIROCIN 2 % EX OINT
1.0000 "application " | TOPICAL_OINTMENT | Freq: Two times a day (BID) | CUTANEOUS | Status: DC
  Administered 2019-04-10: 1 via NASAL
  Filled 2019-04-10: qty 22

## 2019-04-10 MED ORDER — SODIUM ZIRCONIUM CYCLOSILICATE 10 G PO PACK
20.0000 g | PACK | Freq: Once | ORAL | Status: DC
  Filled 2019-04-10: qty 2

## 2019-04-10 MED ORDER — DEXTROSE 50 % IV SOLN: 1.0000 | Freq: Once | INTRAVENOUS | Status: DC

## 2019-04-10 MED ORDER — FUROSEMIDE 10 MG/ML IJ SOLN
80.0000 mg | Freq: Four times a day (QID) | INTRAMUSCULAR | Status: AC
  Administered 2019-04-10 – 2019-04-11 (×5): 80 mg via INTRAVENOUS
  Filled 2019-04-10 (×5): qty 8

## 2019-04-10 MED ORDER — FUROSEMIDE 10 MG/ML IJ SOLN
40.0000 mg | Freq: Once | INTRAMUSCULAR | Status: AC
  Administered 2019-04-10: 40 mg via INTRAVENOUS
  Filled 2019-04-10: qty 4

## 2019-04-10 MED ORDER — SODIUM CHLORIDE 0.9 % IV SOLN
1.0000 g | Freq: Once | INTRAVENOUS | Status: AC
  Administered 2019-04-10: 09:00:00 1 g via INTRAVENOUS
  Filled 2019-04-10: qty 10

## 2019-04-10 MED ORDER — INSULIN ASPART 100 UNIT/ML IV SOLN
10.0000 [IU] | Freq: Once | INTRAVENOUS | Status: AC
  Administered 2019-04-10: 08:00:00 10 [IU] via INTRAVENOUS

## 2019-04-10 MED ORDER — HYDRALAZINE HCL 20 MG/ML IJ SOLN
5.0000 mg | INTRAMUSCULAR | Status: DC | PRN
  Administered 2019-04-10: 5 mg via INTRAVENOUS
  Filled 2019-04-10: qty 1

## 2019-04-10 MED ORDER — ALBUTEROL SULFATE (2.5 MG/3ML) 0.083% IN NEBU
10.0000 mg | INHALATION_SOLUTION | Freq: Once | RESPIRATORY_TRACT | Status: AC
  Administered 2019-04-10: 08:00:00 10 mg via RESPIRATORY_TRACT
  Filled 2019-04-10: qty 12

## 2019-04-10 MED ORDER — ORAL CARE MOUTH RINSE
15.0000 mL | Freq: Two times a day (BID) | OROMUCOSAL | Status: DC
  Administered 2019-04-10 – 2019-04-14 (×7): 15 mL via OROMUCOSAL

## 2019-04-10 MED ORDER — CHLORHEXIDINE GLUCONATE 0.12 % MT SOLN
15.0000 mL | Freq: Two times a day (BID) | OROMUCOSAL | Status: DC
  Administered 2019-04-10 – 2019-04-14 (×9): 15 mL via OROMUCOSAL
  Filled 2019-04-10 (×7): qty 15

## 2019-04-10 MED ORDER — INSULIN ASPART 100 UNIT/ML IV SOLN: 10.0000 [IU] | Freq: Once | INTRAVENOUS | Status: DC

## 2019-04-10 MED ORDER — HEPARIN (PORCINE) 25000 UT/250ML-% IV SOLN
1300.0000 [IU]/h | INTRAVENOUS | Status: DC
  Administered 2019-04-10: 1450 [IU]/h via INTRAVENOUS
  Administered 2019-04-11 (×2): 1400 [IU]/h via INTRAVENOUS
  Administered 2019-04-12: 04:00:00 1350 [IU]/h via INTRAVENOUS
  Administered 2019-04-13: 02:00:00 1300 [IU]/h via INTRAVENOUS
  Filled 2019-04-10 (×4): qty 250

## 2019-04-10 MED ORDER — SODIUM CHLORIDE 0.9 % IV SOLN
2.0000 g | Freq: Three times a day (TID) | INTRAVENOUS | Status: DC
  Administered 2019-04-10 – 2019-04-14 (×14): 2 g via INTRAVENOUS
  Filled 2019-04-10 (×18): qty 2

## 2019-04-10 MED ORDER — CALCIUM GLUCONATE-NACL 1-0.675 GM/50ML-% IV SOLN
1.0000 g | Freq: Once | INTRAVENOUS | Status: AC
  Administered 2019-04-10: 1000 mg via INTRAVENOUS
  Filled 2019-04-10: qty 50

## 2019-04-10 MED ORDER — BISOPROLOL FUMARATE 5 MG PO TABS: 2.5000 mg | ORAL_TABLET | Freq: Every day | ORAL | Status: DC

## 2019-04-10 MED ORDER — SODIUM BICARBONATE 8.4 % IV SOLN
50.0000 meq | Freq: Once | INTRAVENOUS | Status: AC
  Administered 2019-04-10: 50 meq via INTRAVENOUS
  Filled 2019-04-10: qty 50

## 2019-04-10 MED ORDER — CHLORHEXIDINE GLUCONATE CLOTH 2 % EX PADS: 6.0000 | MEDICATED_PAD | Freq: Every day | CUTANEOUS | Status: DC

## 2019-04-10 MED ORDER — SODIUM BICARBONATE 8.4 % IV SOLN: 50.0000 meq | Freq: Once | INTRAVENOUS | Status: DC

## 2019-04-10 MED ORDER — LORAZEPAM 2 MG/ML IJ SOLN
1.0000 mg | INTRAMUSCULAR | Status: AC | PRN
  Administered 2019-04-10 – 2019-04-11 (×3): 1 mg via INTRAVENOUS
  Filled 2019-04-10 (×3): qty 1

## 2019-04-10 MED ORDER — FUROSEMIDE 10 MG/ML IJ SOLN: 80.0000 mg | Freq: Once | INTRAMUSCULAR | Status: DC

## 2019-04-10 MED ORDER — SODIUM CHLORIDE 0.9 % IV SOLN
INTRAVENOUS | Status: DC | PRN
  Administered 2019-04-10 – 2019-04-11 (×2): 500 mL via INTRAVENOUS

## 2019-04-10 MED ORDER — ISOSORB DINITRATE-HYDRALAZINE 20-37.5 MG PO TABS
0.5000 | ORAL_TABLET | Freq: Three times a day (TID) | ORAL | Status: DC
  Administered 2019-04-10 – 2019-04-14 (×13): 0.5 via ORAL
  Filled 2019-04-10 (×9): qty 1
  Filled 2019-04-10 (×2): qty 0.5
  Filled 2019-04-10 (×3): qty 1

## 2019-04-10 MED ORDER — CALCIUM GLUCONATE-NACL 1-0.675 GM/50ML-% IV SOLN: 1.0000 g | Freq: Once | INTRAVENOUS | Status: DC

## 2019-04-10 MED ORDER — SODIUM BICARBONATE 8.4 % IV SOLN
50.0000 meq | Freq: Once | INTRAVENOUS | Status: AC
  Administered 2019-04-10: 04:00:00 50 meq via INTRAVENOUS
  Filled 2019-04-10: qty 50

## 2019-04-10 MED ORDER — DEXTROSE 50 % IV SOLN
1.0000 | Freq: Once | INTRAVENOUS | Status: AC
  Administered 2019-04-10: 50 mL via INTRAVENOUS
  Filled 2019-04-10: qty 50

## 2019-04-10 MED ORDER — DEXTROSE 50 % IV SOLN
1.0000 | Freq: Once | INTRAVENOUS | Status: AC
  Administered 2019-04-10: 04:00:00 50 mL via INTRAVENOUS
  Filled 2019-04-10: qty 50

## 2019-04-10 MED ORDER — LACTULOSE 10 GM/15ML PO SOLN: 30.0000 g | Freq: Three times a day (TID) | ORAL | Status: DC | PRN

## 2019-04-10 MED ORDER — INSULIN ASPART 100 UNIT/ML ~~LOC~~ SOLN
10.0000 [IU] | Freq: Once | SUBCUTANEOUS | Status: AC
  Administered 2019-04-10: 04:00:00 10 [IU] via SUBCUTANEOUS

## 2019-04-10 MED ORDER — APIXABAN 5 MG PO TABS: 5.0000 mg | ORAL_TABLET | Freq: Two times a day (BID) | ORAL | Status: DC

## 2019-04-10 MED ORDER — SODIUM POLYSTYRENE SULFONATE 15 GM/60ML PO SUSP
45.0000 g | Freq: Once | ORAL | Status: AC
  Administered 2019-04-10: 12:00:00 45 g via RECTAL
  Filled 2019-04-10: qty 180
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
## 2019-04-10 NOTE — Progress Notes (Signed)
PROGRESS NOTE    Burak Zerbe  RWE:[id number] DOB: Mar 05, 1946 DOA: 04/09/2019 PCP: [facility]     Brief Narrative:  Nicholas Caldwell is a 73 y.o. male with medical history significant of atrial flutter, paroxysmal atrial fibrillation, CAD, chronic systolic congestive heart failure, COPD on 2-3 L home oxygen, type 2 diabetes, hypertension, hyperlipidemia presenting to the hospital for evaluation of altered mental status.  History provided by EMS.  Per report, he has been altered for the last 2 days.  He was recently hospitalized and treated for a UTI.  He has been off of his antibiotic for the last 2 days.  Wife reported that patient refused to take his medications today and possibly yesterday as well.  He is normally a little slow to answer questions. In the ED, initially on 3 L supplemental oxygen, subsequently required BiPAP for increased WOB. Noted to be wheezing. No leukocytosis.  Lactic acid normal.  Potassium >7.5.  EKG without acute changes.  Creatinine 1.3, improved since prior labs.  UA and urine culture pending.  Blood culture x2 pending.  COVID-19 rapid test negative. Chest x-ray showing moderate to large left and small-to-moderate right pleural effusions with bibasilar areas of atelectasis and/or consolidation.  Also showing cardiomegaly with probable pulmonary edema suggestive of CHF. Head CT negative for acute finding. Patient received albuterol, IV Lasix 40 mg, Solu-Medrol 125 mg, and cefepime in the ED. Nephrology consulted for severe hyperkalemia.   Previous hospitalization briefly reviewed, patient was hospitalized from 5/3 to 5/28.  At that time he was admitted with progressive dyspnea with elevated BNP greater than 1000, atrial fibrillation as well as onset of anemia.  Hospital course at that time was complicated by ischemic stroke, generalized weakness UTI, HCAP.  He had declined rehab placement and went home.  New events last 24 hours / Subjective: Remains on BiPAP this  morning, arousable with verbal stimuli, answer some questions but difficult to understand over BiPAP.  No other acute events overnight.  Potassium remains elevated.   Assessment & Plan:   Principal Problem:   Acute respiratory failure (HCC) Active Problems:   PAF (paroxysmal atrial fibrillation) (HCC)   Acute encephalopathy   Diabetes mellitus type 2 in obese (HCC)   Hyperkalemia   Acute on chronic hypoxic, hypercapnic respiratory failure secondary to acute COPD exacerbation, acute exacerbation of chronic systolic CHF, and ?CAP -Chest x-ray showing moderate to large left and small-to-moderate right pleural effusions with bibasilar areas of atelectasis and/or consolidation.  Also showing cardiomegaly with probable pulmonary edema suggestive of CHF -Patient uses 2-3 L supplemental oxygen at home, currently on BiPAP  -COVID-19 rapid test negative  Acute on chronic systolic CHF  -BNP 6195. Currently on IV Lasix 40 mg twice daily.  Monitor intake/output, daily weights, and fluid/sodium restriction.  At time of discharge at the end of May, patient was discharged home on torsemide 100 twice daily, potassium supplement, metolazone twice a week, BiDil, bisoprolol, spironolactone, Eliquis and Crestor.  -May need thoracentesis once patient stabilized from respiratory standpoint off BiPAP -Consulted cardiology today, spoke with Dr. Domenic Polite. Plan to transfer to [facility] for advanced heart failure team consultation   Acute COPD exacerbation -Wheezing as well at time of admission  -Continue Solu-Medrol, Duo nebs   Severe hyperkalemia -Spironolactone, potassium supplementation listed as home medication, held during this admission -Unable to take p.o. Lokelma, as patient is on BiPAP.  Patient given calcium gluconate, insulin/D50, sodium bicarb, IV Lasix -Nephrology consulted -Potassium every 4 hours  Acute  metabolic encephalopathy -Possibly due to hypoxia/hypercapnia -Head CT negative  for acute finding -Ammonia elevated at 67, no previous history of cirrhosis.  Unable to take oral lactulose due to BiPAP use  CKD stage III -Baseline creatinine creatinine at time of discharge on 5/28 was in the range of 1.9-2 -Creatinine 1.32 this morning  Elevated alkaline phosphatase -Alkaline phosphatase 171, remainder of LFTs normal. GGT 111  Well-controlled type 2 diabetes -HA1c 6.8 on Mar 22, 2019 -Sliding scale insulin   Paroxysmal atrial fibrillation -Holding bisoprolol and Eliquis while on BiPAP.  IV heparin while NPO on BiPAP   DVT prophylaxis: IV heparin Code Status: Full code Family Communication: Attempted to call wife several times with no answer Disposition Plan: Transfer to [facility] today    Consultants:   Nephrology  Cardiology, Advanced Heart Failure team to follow   Procedures:   None   Antimicrobials:  Anti-infectives (From admission, onward)   Start     Dose/Rate Route Frequency Ordered Stop   04/10/19 0600  ceFEPIme (MAXIPIME) 2 g in sodium chloride 0.9 % 100 mL IVPB     2 g 200 mL/hr over 30 Minutes Intravenous Every 8 hours 04/10/19 0003     04/09/19 2130  ceFEPIme (MAXIPIME) 2 g in sodium chloride 0.9 % 100 mL IVPB     2 g 200 mL/hr over 30 Minutes Intravenous  Once 04/09/19 2124 04/09/19 2304       Objective: Vitals:   04/10/19 0800 04/10/19 0831 04/10/19 0900 04/10/19 1136  BP: (!) 135/51  (!) 143/50   Pulse: (!) 57  (!) 57 67  Resp: (!) _0 Temp: 98.6 F (37 C)     TempSrc: Axillary     SpO2: 99% 100% 100% 100%  Weight:      Height:        Intake/Output Summary (Last 24 hours) at 04/10/2019 1226 Last data filed at 04/10/2019 0602 Gross per 24 hour  Intake 192.5 ml  Output 1000 ml  Net -807.5 ml   Filed Weights   04/09/19 1922 04/10/19 0421  Weight: 117.9 kg 123.9 kg    Examination:  General exam: Appears calm and comfortable  Respiratory system: Remains on BiPAP, acute respiratory distress noted,  diminished breath sounds bilateral bases Cardiovascular system: S1 & S2 heard, bradycardic, dependent pitting edema Gastrointestinal system: Abdomen is nondistended, soft  Central nervous system: Arousable to voice, oriented to self, place, year but difficult to understand due to BiPAP mask in place  Extremities: Symmetric Skin: No rashes, lesions or ulcers Psychiatry: Stable   Data Reviewed: I have personally reviewed following labs and imaging studies  CBC: Recent Labs  Lab 04/09/19 1959 04/09/19 2017  WBC 6.0  --   NEUTROABS 4.2  --   HGB 9.9* 11.6*  HCT 37.3* 34.0*  MCV 95.2  --   PLT 319  --    Basic Metabolic Panel: Recent Labs  Lab 04/09/19 1959 04/09/19 2017 04/09/19 2226 04/10/19 0154 04/10/19 0652  NA 140 140  --  146* 144  K >7.5* 7.7* >7.5* 7.0*  6.9* 7.3*  CL 108 111  --  110 110  CO2 27  --   --  24 28  GLUCOSE 110* 110*  --  101* 121*  BUN 35* 33*  --  31* 33*  CREATININE 1.36* 1.40*  --  1.41* 1.32*  CALCIUM 8.9  --   --  9.4 9.4   GFR: Estimated Creatinine Clearance: 70.8 mL/min (A) (  by C-G formula based on SCr of 1.32 mg/dL (H)). Liver Function Tests: Recent Labs  Lab 04/09/19 1959  AST 19  ALT 25  ALKPHOS 171*  BILITOT 0.2*  PROT 7.5  ALBUMIN 3.4*   No results for input(s): LIPASE, AMYLASE in the last 168 hours. Recent Labs  Lab 04/10/19 0154  AMMONIA 67*   Coagulation Profile: No results for input(s): INR, PROTIME in the last 168 hours. Cardiac Enzymes: Recent Labs  Lab 04/09/19 1959  TROPONINI <0.03   BNP (last 3 results) No results for input(s): PROBNP in the last 8760 hours. HbA1C: No results for input(s): HGBA1C in the last 72 hours. CBG: Recent Labs  Lab 04/09/19 1925 04/10/19 0806 04/10/19 1209  GLUCAP 108* 97 149*   Lipid Profile: No results for input(s): CHOL, HDL, LDLCALC, TRIG, CHOLHDL, LDLDIRECT in the last 72 hours. Thyroid Function Tests: Recent Labs    04/10/19 0154  TSH 0.587   Anemia Panel:  Recent Labs    04/10/19 0154  VITAMINB12 337   Sepsis Labs: Recent Labs  Lab 04/09/19 1959 04/09/19 2226 04/10/19 0154  PROCALCITON  --   --  0.16  LATICACIDVEN 0.5 0.5  --     Recent Results (from the past 240 hour(s))  Culture, blood (routine x 2)     Status: None (Preliminary result)   Collection Time: 04/09/19  7:59 PM  Result Value Ref Range Status   Specimen Description   Final    BLOOD RIGHT ARM Performed at [facility] [address] [place], [place] [address]    Special Requests   Final    BOTTLES DRAWN AEROBIC ONLY Blood Culture results may not be optimal due to an inadequate volume of blood received in culture bottles Performed at [facility] [address] [place], [place] [address]    Culture   Final    NO GROWTH < 12 HOURS Performed at [facility] [address], [place], [place] [address]    Report Status PENDING  Incomplete  Culture, blood (routine x 2)     Status: None (Preliminary result)   Collection Time: 04/09/19  7:59 PM  Result Value Ref Range Status   Specimen Description   Final    BLOOD RIGHT ARM Performed at [facility] [address], [place], [place] [address]    Special Requests   Final    BOTTLES DRAWN AEROBIC AND ANAEROBIC Blood Culture results may not be optimal due to an excessive volume of blood received in culture bottles Performed at [facility] [address] [place], [place] [address]    Culture   Final    NO GROWTH < 12 HOURS Performed at [facility] [address] [place], [place] [address]    Report Status PENDING  Incomplete  SARS Coronavirus 2 (CEPHEID - Performed in [facility] hospital lab), Hosp Order     Status: None   Collection Time: 04/09/19  7:59 PM  Result Value Ref Range Status   SARS Coronavirus 2 NEGATIVE NEGATIVE Final    Comment: (NOTE) If result is NEGATIVE SARS-CoV-2 target nucleic acids are NOT  DETECTED. The SARS-CoV-2 RNA is generally detectable in upper and lower  respiratory specimens during the acute phase of infection. The lowest  concentration of SARS-CoV-2 viral copies this assay can detect is 250  copies / mL. A negative result does not preclude SARS-CoV-2 infection  and should not be used as the  sole basis for treatment or other  patient management decisions.  A negative result may occur with  improper specimen collection / handling, submission of specimen other  than nasopharyngeal swab, presence of viral mutation(s) within the  areas targeted by this assay, and inadequate number of viral copies  (<250 copies / mL). A negative result must be combined with clinical  observations, patient history, and epidemiological information. If result is POSITIVE SARS-CoV-2 target nucleic acids are DETECTED. The SARS-CoV-2 RNA is generally detectable in upper and lower  respiratory specimens dur ing the acute phase of infection.  Positive  results are indicative of active infection with SARS-CoV-2.  Clinical  correlation with patient history and other diagnostic information is  necessary to determine patient infection status.  Positive results do  not rule out bacterial infection or co-infection with other viruses. If result is PRESUMPTIVE POSTIVE SARS-CoV-2 nucleic acids MAY BE PRESENT.   A presumptive positive result was obtained on the submitted specimen  and confirmed on repeat testing.  While 2019 novel coronavirus  (SARS-CoV-2) nucleic acids may be present in the submitted sample  additional confirmatory testing may be necessary for epidemiological  and / or clinical management purposes  to differentiate between  SARS-CoV-2 and other Sarbecovirus currently known to infect humans.  If clinically indicated additional testing with an alternate test  methodology [phone]) is advised. The SARS-CoV-2 RNA is generally  detectable in upper and lower respiratory sp ecimens during  the acute  phase of infection. The expected result is Negative. Fact Sheet for Patients:  StrictlyIdeas.no Fact Sheet for Healthcare Providers: BankingDealers.co.za This test is not yet approved or cleared by the [place] FDA and has been authorized for detection and/or diagnosis of SARS-CoV-2 by FDA under an Emergency Use Authorization (EUA).  This EUA will remain in effect (meaning this test can be used) for the duration of the COVID-19 declaration under Section 564(b)(1) of the Act, 21 U.S.C. section 360bbb-3(b)(1), unless the authorization is terminated or revoked sooner. Performed at [facility], [place] [address], [place], [place] [address]   MRSA PCR Screening     Status: None   Collection Time: 04/10/19  1:34 AM  Result Value Ref Range Status   MRSA by PCR NEGATIVE NEGATIVE Final    Comment:        The GeneXpert MRSA Assay (FDA approved for NASAL specimens only), is one component of a comprehensive MRSA colonization surveillance program. It is not intended to diagnose MRSA infection nor to guide or monitor treatment for MRSA infections. Performed at [facility], [place] [address] [place], [place] [address]        Radiology Studies: Ct Head Wo Contrast  Result Date: 04/09/2019 CLINICAL DATA:  Altered level of consciousness over the past 2 days. Bladder infection 2 weeks ago. Not taking antibiotics. EXAM: CT HEAD WITHOUT CONTRAST TECHNIQUE: Contiguous axial images were obtained from the base of the skull through the vertex without intravenous contrast. COMPARISON:  CT 03/18/2019 and MRI 03/20/2019 FINDINGS: Brain: Ventricles, cisterns and other CSF spaces are within normal. Cavum septum variant is present. Subtle chronic ischemic microvascular disease. Stable old small cerebellar infarcts. No mass, mass effect, shift of midline structures or acute hemorrhage. No evidence of acute  infarction. Vascular: No hyperdense vessel or unexpected calcification. Skull: Normal. Negative for fracture or focal lesion. Sinuses/Orbits: Orbits are normal symmetric. Paranasal sinuses are normal. Other: None. IMPRESSION: No acute findings. Minimal chronic ischemic microvascular disease. Small old lacunar cerebellar infarcts. Electronically  Signed   By: Marin Olp M.D.   On: 04/09/2019 21:15   Dg Chest Port 1 View  Result Date: 04/09/2019 CLINICAL DATA:  73 year old male with history of bladder infection 2 weeks ago. EXAM: PORTABLE CHEST 1 VIEW COMPARISON:  Chest x-ray 03/25/2019. FINDINGS: Previously noted right upper extremity PICC has been removed. Diffuse haziness and patchy interstitial opacities throughout the lungs bilaterally. Confluent opacity in the lung bases bilaterally (left greater than right), likely to reflect moderate to large left and small to moderate right pleural effusions with associated areas of bibasilar atelectasis and/or airspace consolidation. Enlargement of the cardiopericardial silhouette which appears very indistinct. Pulmonary vasculature is obscured. The patient is rotated to the left on today's exam, resulting in distortion of the mediastinal contours and reduced diagnostic sensitivity and specificity for mediastinal pathology. Aortic atherosclerosis. IMPRESSION: 1. Moderate to large left and small to moderate right pleural effusions with bibasilar areas of atelectasis and/or consolidation. 2. Cardiomegaly with probable pulmonary edema, suggestive of congestive heart failure. 3. Aortic atherosclerosis. Electronically Signed   By: Vinnie Langton M.D.   On: 04/09/2019 21:20      Scheduled Meds: . furosemide  80 mg Intravenous Q6H  . insulin aspart  0-9 Units Subcutaneous TID WC  . ipratropium-albuterol  3 mL Nebulization Q6H  . methylPREDNISolone (SOLU-MEDROL) injection  60 mg Intravenous Q8H  . sodium polystyrene  45 g Rectal Once   Continuous Infusions: .  ceFEPime (MAXIPIME) IV Stopped (04/10/19 3151)     LOS: 1 day    Time spent: 45 minutes   Dessa Phi, DO [facility] [email or web address] 04/10/2019, 12:26 PM

## 2019-04-10 NOTE — Progress Notes (Signed)
CRITICAL VALUE ALERT  Critical Value:  Potassium 7.3  Date & Time Notied:  04/10/2019 7:40 AM  Provider Notified: Dessa Phi   Orders Received/Actions taken:  New orders placed. Calcium gluconate, IV dextrose/insulin and bicarb injection

## 2019-04-10 NOTE — Consult Note (Signed)
Reason for Consult: Hyperkalemia, chronic kidney disease Referring Physician: Dessa Phi, MD [facility])  HPI: (History obtained from chart review as patient is on BiPAP) 73 year old African-American man with past medical history significant for paroxysmal atrial fibrillation, coronary artery disease, chronic systolic congestive heart failure, chronic obstructive lung disease on home oxygen, type 2 diabetes mellitus, hypertension, dyslipidemia and chronic kidney disease stage III at baseline (creatinine ~1.4).  He was recently discharged from the hospital on 03/31/2019 after prolonged admission for acute on chronic respiratory failure from CHF exacerbation with acute versus subacute CVA and Citrobacter UTI.  He was brought to the emergency room last night with altered mental status and was found to have hyperkalemia of >7.5 with a creatinine of 1.3.  He was on potassium 120 mEq a day, spironolactone 25 mg daily, torsemide 100 mg twice a day and metolazone 2.5 mg twice a week with extra potassium 20 MEQ.  There is report that the patient might not have taken his medications yesterday.  Chest x-ray shows bilateral pleural effusions left greater than right with atelectasis and probable pulmonary edema suggestive of CHF exacerbation.  He is on BiPAP for hypoxic respiratory failure and in spite of getting furosemide, potassium level remains elevated.  Past Medical History:  Diagnosis Date  . Atrial flutter ([place])    a. recurrent AFlutter with RVR;  b. Amiodarone Rx started 4/16  . CAD (coronary artery disease)    a. LHC 1/16:  mLAD diffuse disease, pLCx mild disease, dLCx with disease but too small for PCI, RCA ok, EF 25-30%  . Chronic pain   . Chronic systolic CHF (congestive heart failure) ([place])   . COPD (chronic obstructive pulmonary disease) ([place])   . Diabetes mellitus without complication ([place])   . Hypercholesteremia   . Hypertension   . NICM (nonischemic cardiomyopathy) ([place])    a.dx 2016. b. 2D  echo 06/2016 - Last echo 07/01/16: mod dilated LV, mod LVH, EF 25-30%, mild-mod MR, sev LAE, mild-mod reduced RV systolic function, mild-mod TR, PASP 10mHG.  .Marland KitchenPAF (paroxysmal atrial fibrillation) (H[facility])    On amio - ot a candidate for flecainide due to cardiomyopathy, not a candidate for Tikosyn due to prolonged QT, and felt to be a poor candidate for ablation given left atrial size.  . Pulmonary hypertension ([facility]   . Tobacco abuse     Past Surgical History:  Procedure Laterality Date  . BIOPSY  03/11/2019   Procedure: BIOPSY;  Surgeon: CLavena Bullion DO;  [facility]  Service: Gastroenterology;;  . BIOPSY  03/15/2019   Procedure: BIOPSY;  Surgeon: MIrving Copas, MD;  [place] [facility]  Service: Gastro[place]ology;;  . CARDIOVERSION N/A 07/30/2018   Procedure: CARDIOVERSION;  Surgeon: MLarey Dresser MD;  [facility];  Service: Cardiovascular;  Laterality: N/A;  . COLONOSCOPY N/A 03/11/2019   Procedure: COLONOSCOPY;  Surgeon: CLavena Bullion DO;  [place] [facility]  Service: Gastroenterology;  Laterality: N/A;  . ENTEROSCOPY N/A 03/15/2019   Procedure: ENTEROSCOPY;  Surgeon: MRush LandmarkGTelford Nab, MD;  [place]  Service: Gastroenterology;  Laterality: N/A;  . ESOPHAGOGASTRODUODENOSCOPY Left 03/11/2019   Procedure: ESOPHAGOGASTRODUODENOSCOPY (EGD);  Surgeon: CLavena Bullion DO;  [facility];  Service: Gastroenterology;  Laterality: Left;  . LEFT AND RIGHT HEART CATHETERIZATION WITH CORONARY ANGIOGRAM N/A 11/06/2014   Procedure: LEFT AND RIGHT HEART CATHETERIZATION WITH CORONARY ANGIOGRAM;  Surgeon: JJettie Booze MD;  [facility];  Service: Cardiovascular;  Laterality: N/A;  . RIGHT/LEFT HEART  CATH AND CORONARY ANGIOGRAPHY N/A 12/06/2018   Procedure: RIGHT/LEFT HEART CATH AND CORONARY ANGIOGRAPHY;  Surgeon: Larey Dresser, MD;  Location: [place] CV LAB;  Service: Cardiovascular;  Laterality: N/A;  .  SPLENECTOMY    . SUBMUCOSAL TATTOO INJECTION  03/15/2019   Procedure: SUBMUCOSAL TATTOO INJECTION;  Surgeon: Rush Landmark Telford Nab., MD;  Location: [facility] END[facility]COPY;  Service: Gastroenterology;;    Family History  Problem Relation Age of Onset  . Heart disease Mother   . Hypertension Mother   . Heart failure Mother   . Heart disease Father   . Hypertension Sister   . Heart attack Neg Hx   . Stroke Neg Hx   . Colon cancer Neg Hx   . Esophageal cancer Neg Hx     Social History:  reports that he quit smoking about 4 months ago. His smoking use included cigarettes. He has a 34.00 pack-year smoking history. He has never used smokeless tobacco. He reports that he does not drink alcohol or use drugs.  Allergies: No Known Allergies  Medications:  Scheduled: . apixaban  5 mg Oral BID  . furosemide  40 mg Intravenous BID  . insulin aspart  0-9 Units Subcutaneous TID WC  . ipratropium-albuterol  3 mL Nebulization Q6H  . methylPREDNISolone (SOLU-MEDROL) injection  60 mg Intravenous Q8H  . sodium polystyrene  45 g Rectal Once    BMP Latest Ref Rng & Units 04/10/2019 04/10/2019 04/10/2019  Glucose 70 - 99 mg/dL 121(H) 101(H) -  BUN 8 - 23 mg/dL 33(H) 31(H) -  Creatinine 0.61 - 1.24 mg/dL 1.32(H) 1.41(H) -  Sodium 135 - 145 mmol/L 144 146(H) -  Potassium 3.5 - 5.1 mmol/L 7.3(HH) 7.0(HH) 6.9(HH)  Chloride 98 - 111 mmol/L 110 110 -  CO2 22 - 32 mmol/L 28 24 -  Calcium 8.9 - 10.3 mg/dL 9.4 9.4 -   CBC Latest Ref Rng & Units 04/09/2019 04/09/2019 03/30/2019  WBC 4.0 - 10.5 K/uL - 6.0 8.3  Hemoglobin 13.0 - 17.0 g/dL 11.6(L) 9.9(L) 8.6(L)  Hematocrit 39.0 - 52.0 % 34.0(L) 37.3(L) 29.1(L)  Platelets 150 - 400 K/uL - 319 329     Ct Head Wo Contrast  Result Date: 04/09/2019 CLINICAL DATA:  Altered level of consciousness over the past 2 days. Bladder infection 2 weeks ago. Not taking antibiotics. EXAM: CT HEAD WITHOUT CONTRAST TECHNIQUE: Contiguous axial images were obtained from the base of the skull  through the vertex without intravenous contrast. COMPARISON:  CT 03/18/2019 and MRI 03/20/2019 FINDINGS: Brain: Ventricles, cisterns and other CSF spaces are within normal. Cavum septum variant is present. Subtle chronic ischemic microvascular disease. Stable old small cerebellar infarcts. No mass, mass effect, shift of midline structures or acute hemorrhage. No evidence of acute infarction. Vascular: No hyperdense vessel or unexpected calcification. Skull: Normal. Negative for fracture or focal lesion. Sinuses/Orbits: Orbits are normal symmetric. Paranasal sinuses are normal. Other: None. IMPRESSION: No acute findings. Minimal chronic ischemic microvascular disease. Small old lacunar cerebellar infarcts. Electronically Signed   By: Marin Olp M.D.   On: 04/09/2019 21:15   Dg Chest Port 1 View  Result Date: 04/09/2019 CLINICAL DATA:  73 year old male with history of bladder infection 2 weeks ago. EXAM: PORTABLE CHEST 1 VIEW COMPARISON:  Chest x-ray 03/25/2019. FINDINGS: Previously noted right upper extremity PICC has been removed. Diffuse haziness and patchy interstitial opacities throughout the lungs bilaterally. Confluent opacity in the lung bases bilaterally (left greater than right), likely to reflect moderate to large left and small  to moderate right pleural effusions with associated areas of bibasilar atelectasis and/or airspace consolidation. Enlargement of the cardiopericardial silhouette which appears very indistinct. Pulmonary vasculature is obscured. The patient is rotated to the left on today's exam, resulting in distortion of the mediastinal contours and reduced diagnostic sensitivity and specificity for mediastinal pathology. Aortic atherosclerosis. IMPRESSION: 1. Moderate to large left and small to moderate right pleural effusions with bibasilar areas of atelectasis and/or consolidation. 2. Cardiomegaly with probable pulmonary edema, suggestive of congestive heart failure. 3. Aortic  atherosclerosis. Electronically Signed   By: Vinnie Langton M.D.   On: 04/09/2019 21:20    Review of Systems  Unable to perform ROS: Severe respiratory distress (On BiPAP)   Blood pressure (!) 143/50, pulse 67, temperature 98.6 F (37 C), temperature source Axillary, resp. rate 14, height 6' (1.829 m), weight 123.9 kg, SpO2 100 %. Physical Exam  Nursing note and vitals reviewed. Constitutional: He appears well-developed and well-nourished.  On BiPAP, somnolent  HENT:  Head: Normocephalic and atraumatic.  Eyes: Conjunctivae are normal.  Neck: Neck supple. JVD present.  Cardiovascular: Normal rate. Exam reveals no friction rub.  No murmur heard. Irregularly irregular  Respiratory: He has no wheezes. He has rales.  Fine rales over both bases  GI: Soft. Bowel sounds are normal. There is no abdominal tenderness. There is no rebound.  Musculoskeletal:        General: Edema present.     Comments: Trace-1+ lower extremity edema  Skin: Skin is warm and dry. No rash noted.    Assessment/Plan: 1.  Hyperkalemia: Likely iatrogenic from ongoing potassium replacement/spironolactone in this patient with diabetes and possible distal tubular potassium handling defect/RTA.  He is scheduled to have a Kayexalate enema and I will increase his scheduled furosemide dose with frequent labs to follow potassium levels.  Attempting to avoid dialysis at this time. 2.  Acute hypoxic respiratory failure-likely CHF exacerbation: On BiPAP, ongoing volume unloading with diuretics.  He will likely need thoracentesis of his left pleural effusion when stabilized. 3.  Hypernatremia: Secondary to recent diuretic use/poor oral intake with encephalopathy.  Monitor with diuresis. 4.  Metabolic encephalopathy: Likely secondary to respiratory failure.  Bertha Earwood K. 04/10/2019, 11:38 AM

## 2019-04-10 NOTE — Progress Notes (Signed)
Pt transported to 1225 on BIPAP without incident.  RT to monitor and assess as needed.

## 2019-04-10 NOTE — Progress Notes (Signed)
Pharmacy Antibiotic Note  Nicholas Caldwell is a 73 y.o. male admitted on 04/09/2019 with pneumonia.  Pharmacy has been consulted for Cefepime dosing.  Plan: Cefepime 2gm iv q8hr  Height: 6' (182.9 cm) Weight: 259 lb 14.8 oz (117.9 kg) IBW/kg (Calculated) : 77.6  Temp (24hrs), Avg:98.4 F (36.9 C), Min:98.4 F (36.9 C), Max:98.4 F (36.9 C)  Recent Labs  Lab 04/09/19 1959 04/09/19 2017 04/09/19 2226  WBC 6.0  --   --   CREATININE 1.36* 1.40*  --   LATICACIDVEN 0.5  --  0.5    Estimated Creatinine Clearance: 65.1 mL/min (A) (by C-G formula based on SCr of 1.4 mg/dL (H)).    No Known Allergies  Antimicrobials this admission: Cefepime 04/09/2019 >>   Dose adjustments this admission: -  Microbiology results: -  Thank you for allowing pharmacy to be a part of this patient's care.  Nani Skillern Crowford 04/10/2019 12:04 AM

## 2019-04-10 NOTE — Progress Notes (Signed)
ANTICOAGULATION CONSULT NOTE - Initial Consult  Pharmacy Consult for heparin Indication: bridge therapy while pt on BiPAP and apixaban for afib on hold, stroke on last admission  No Known Allergies  Patient Measurements: Height: 6' (182.9 cm) Weight: 273 lb 2.4 oz (123.9 kg) IBW/kg (Calculated) : 77.6 Heparin Dosing Weight: 103.3 kg  Vital Signs: Temp: 98.6 F (37 C) (06/07 0800) Temp Source: Axillary (06/07 0800) BP: 143/50 (06/07 0900) Pulse Rate: 67 (06/07 1136)  Labs: Recent Labs    04/09/19 1959 04/09/19 2017 04/10/19 0154 04/10/19 0652  HGB 9.9* 11.6*  --   --   HCT 37.3* 34.0*  --   --   PLT 319  --   --   --   CREATININE 1.36* 1.40* 1.41* 1.32*  TROPONINI <0.03  --   --   --     Estimated Creatinine Clearance: 70.8 mL/min (A) (by C-G formula based on SCr of 1.32 mg/dL (H)).   Medical History: Past Medical History:  Diagnosis Date  . Atrial flutter ([place])    a. recurrent AFlutter with RVR;  b. Amiodarone Rx started 4/16  . CAD (coronary artery disease)    a. LHC 1/16:  mLAD diffuse disease, pLCx mild disease, dLCx with disease but too small for PCI, RCA ok, EF 25-30%  . Chronic pain   . Chronic systolic CHF (congestive heart failure) ([place])   . COPD (chronic obstructive pulmonary disease) ([place])   . Diabetes mellitus without complication ([place])   . Hypercholesteremia   . Hypertension   . NICM (nonischemic cardiomyopathy) ([place])    a.dx 2016. b. 2D echo 06/2016 - Last echo 07/01/16: mod dilated LV, mod LVH, EF 25-30%, mild-mod MR, sev LAE, mild-mod reduced RV systolic function, mild-mod TR, PASP 32mHG.  .Marland KitchenPAF (paroxysmal atrial fibrillation) ([facility])    On amio - ot a candidate for flecainide due to cardiomyopathy, not a candidate for Tikosyn due to prolonged QT, and felt to be a poor candidate for ablation given left atrial size.  . Pulmonary hypertension ([place]   . Tobacco abuse     Medications:  Medications Prior to Admission  Medication Sig Dispense Refill  Last Dose  . albuterol (VENTOLIN HFA) 108 (90 Base) MCG/ACT inhaler Inhale 2 puffs into the lungs every 6 (six) hours as needed for wheezing or shortness of breath.   Past Month at Unknown time  . apixaban (ELIQUIS) 5 MG TABS tablet Take 1 tablet (5 mg total) by mouth 2 (two) times daily. 60 tablet 0 Past Week at Unknown time  . bisoprolol (ZEBETA) 5 MG tablet Take 0.5 tablets (2.5 mg total) by mouth daily. 30 tablet 0 Past Week at Unknown time  . budesonide-formoterol (SYMBICORT) 160-4.5 MCG/ACT inhaler Inhale 2 puffs into the lungs 2 (two) times daily.   Past Week at Unknown time  . diazepam (VALIUM) 5 MG tablet Take 5 mg by mouth daily as needed for anxiety.   Past Month at Unknown time  . ferrous sulfate 325 (65 FE) MG tablet Take 1 tablet (325 mg total) by mouth 2 (two) times daily with a meal. 60 tablet 0 Past Week at Unknown time  . fluticasone (FLONASE) 50 MCG/ACT nasal spray Place 2 sprays into both nostrils daily as needed for allergies.    Past Month at Unknown time  . gabapentin (NEURONTIN) 600 MG tablet Take 1 tablet (600 mg total) by mouth 2 (two) times daily. 60 tablet 0 Past Week at Unknown time  . guaifenesin (HUMIBID E) 400  MG TABS tablet Take 1 tablet (400 mg total) by mouth every 6 (six) hours as needed. (Patient taking differently: Take 400 mg by mouth every 6 (six) hours as needed (cough). ) 10 tablet 0 Past Month at Unknown time  . hydrocortisone (ANUSOL-HC) 2.5 % rectal cream Place 1 application rectally 2 (two) times daily as needed for hemorrhoids or itching.    Past Month at Unknown time  . hydrocortisone 1 % lotion Apply 1 application topically See admin instructions. Apply small amount to back twice daily for itchy rash   Past Week at Unknown time  . insulin aspart (NOVOLOG) 100 UNIT/ML injection Inject 4 Units into the skin 3 (three) times daily with meals. 10 mL 0 Past Week at Unknown time  . insulin aspart protamine - aspart (NOVOLOG MIX 70/30 FLEXPEN) (70-30) 100 UNIT/ML  FlexPen Inject 0.1 mLs (10 Units total) into the skin 2 (two) times daily. 15 mL 0 Past Week at Unknown time  . ipratropium-albuterol (DUONEB) 0.5-2.5 (3) MG/3ML SOLN Take 3 mLs by nebulization 2 (two) times daily as needed (shortness of breath/wheezing).    Past Month at Unknown time  . isosorbide-hydrALAZINE (BIDIL) 20-37.5 MG tablet Take 0.5 tablets by mouth 3 (three) times daily. 90 tablet 0 Past Week at Unknown time  . latanoprost (XALATAN) 0.005 % ophthalmic solution Place 1 drop into both eyes at bedtime.   Past Week at Unknown time  . metolazone (ZAROXOLYN) 2.5 MG tablet Take 1 tablet (2.5 mg total) by mouth every Wednesday. Take extra Potassium 41mq when taking this medication  **Please also take Metolazone 2.5 mg Every Friday and Take extra Potassium 21m when taking this medication.** (Patient taking differently: Take 2.5 mg by mouth every Wednesday. Take extra Potassium 2013mwhen taking this medication  **Please also take Metolazone 2.5 mg Every Friday and Take extra Potassium 44m25mhen taking this medication.**) 30 tablet 0 Past Week at Unknown time  . OXYGEN Inhale 3 L into the lungs continuous.    04/09/2019 at Unknown time  . pantoprazole (PROTONIX) 40 MG tablet Take 1 tablet (40 mg total) by mouth 2 (two) times daily. 60 tablet 0 Past Week at Unknown time  . polyethylene glycol (MIRALAX / GLYCOLAX) packet Take 17 g by mouth daily. (Patient taking differently: Take 17 g by mouth daily as needed (constipation). ) 14 each 0 Past Month at Unknown time  . Potassium Chloride ER 20 MEQ TBCR Take 40 mEq by mouth 3 (three) times daily. 90 tablet 0 Past Week at Unknown time  . rosuvastatin (CRESTOR) 20 MG tablet Take 1 tablet (20 mg total) by mouth daily at 6 PM. 30 tablet 0 Past Week at Unknown time  . sodium chloride (OCEAN) 0.65 % SOLN nasal spray Place 2 sprays into both nostrils as needed for congestion.    Past Month at Unknown time  . spironolactone (ALDACTONE) 25 MG tablet Take 1  tablet (25 mg total) by mouth daily. 30 tablet 0 Past Week at Unknown time  . torsemide (DEMADEX) 100 MG tablet Take 1 tablet (100 mg total) by mouth 2 (two) times daily. 30 tablet 0 Past Week at Unknown time    Assessment: 72 y45M on apixaban 5 mg po bid PTA for Afib.  Pharmacy consulted for heparin for bridge therapy while pt unable to take apixaban due to BiPAP.  Pt w/ stroke last admission.  Wt 123.9 kg, HDW 103.3 kg. Hg 11.6, PLCT WNL.  Scr 1.32.  PTA apixa 5 mg po  BID: Last dose: "past week at unknown time" Stroke 03/18/19 on recent admission < 1 month ago. Stroke felt to be thromboembolic in the setting of afib and holding anticoag for GIB. Baseline heparin level elevated at 0.79 due to recent apixaban. Baseline aPTT is 29 sec WNL.  Goal of Therapy:  APTT 66 - 85 sec Heparin level 0.3 - 0.5 due to recent stroke Monitor platelets by anticoagulation protocol: Yes   Plan:  No bolus due to recent stroke Draw baseline aPTT and heparin level to see effect of PTA apixaban Start heparin drip at 1450 units/hr and check 8 hr aPTT/heparin level Daily CBC, heparin level aPTT while on heparin F/u ability to resume PTA apixaban  Leodis Sias T 04/10/2019,12:22 PM

## 2019-04-11 DIAGNOSIS — J9602 Acute respiratory failure with hypercapnia: Secondary | ICD-10-CM

## 2019-04-11 DIAGNOSIS — J9622 Acute and chronic respiratory failure with hypercapnia: Secondary | ICD-10-CM

## 2019-04-11 DIAGNOSIS — I5023 Acute on chronic systolic (congestive) heart failure: Secondary | ICD-10-CM

## 2019-04-11 DIAGNOSIS — E669 Obesity, unspecified: Secondary | ICD-10-CM

## 2019-04-11 DIAGNOSIS — J9621 Acute and chronic respiratory failure with hypoxia: Secondary | ICD-10-CM

## 2019-04-11 DIAGNOSIS — J9601 Acute respiratory failure with hypoxia: Secondary | ICD-10-CM

## 2019-04-11 DIAGNOSIS — I480 Paroxysmal atrial fibrillation: Secondary | ICD-10-CM

## 2019-04-11 DIAGNOSIS — R4182 Altered mental status, unspecified: Secondary | ICD-10-CM

## 2019-04-11 DIAGNOSIS — E1169 Type 2 diabetes mellitus with other specified complication: Secondary | ICD-10-CM

## 2019-04-11 LAB — BASIC METABOLIC PANEL
Anion gap: 16 — ABNORMAL HIGH (ref 5–15)
BUN: 37 mg/dL — ABNORMAL HIGH (ref 8–23)
CO2: 31 mmol/L (ref 22–32)
Calcium: 9 mg/dL (ref 8.9–10.3)
Chloride: 99 mmol/L (ref 98–111)
Creatinine, Ser: 1.5 mg/dL — ABNORMAL HIGH (ref 0.61–1.24)
GFR calc Af Amer: 54 mL/min — ABNORMAL LOW (ref >60)
GFR calc non Af Amer: 46 mL/min — ABNORMAL LOW (ref >60)
Glucose, Bld: 170 mg/dL — ABNORMAL HIGH (ref 70–99)
Potassium: 3.2 mmol/L — ABNORMAL LOW (ref 3.5–5.1)
Sodium: 146 mmol/L — ABNORMAL HIGH (ref 135–145)

## 2019-04-11 LAB — GLUCOSE, CAPILLARY
Glucose-Capillary: 234 mg/dL — ABNORMAL HIGH (ref 70–99)
Glucose-Capillary: 164 mg/dL — ABNORMAL HIGH (ref 70–99)
Glucose-Capillary: 156 mg/dL — ABNORMAL HIGH (ref 70–99)
Glucose-Capillary: 171 mg/dL — ABNORMAL HIGH (ref 70–99)

## 2019-04-11 LAB — URINE CULTURE: Culture: NO GROWTH

## 2019-04-11 LAB — APTT
aPTT: 86 seconds — ABNORMAL HIGH (ref 24–36)
aPTT: 86 seconds — ABNORMAL HIGH (ref 24–36)

## 2019-04-11 LAB — HEPARIN LEVEL (UNFRACTIONATED): Heparin Unfractionated: 0.96 IU/mL — ABNORMAL HIGH (ref 0.30–0.70)

## 2019-04-11 MED ORDER — POTASSIUM CHLORIDE CRYS ER 20 MEQ PO TBCR
20.0000 meq | EXTENDED_RELEASE_TABLET | Freq: Once | ORAL | Status: DC
  Filled 2019-04-11: qty 1

## 2019-04-11 MED ORDER — MORPHINE SULFATE (PF) 2 MG/ML IV SOLN
2.0000 mg | INTRAVENOUS | Status: DC | PRN
  Administered 2019-04-11 – 2019-04-14 (×15): 2 mg via INTRAVENOUS
  Filled 2019-04-11 (×15): qty 1

## 2019-04-11 MED ORDER — TORSEMIDE 20 MG PO TABS
100.0000 mg | ORAL_TABLET | Freq: Two times a day (BID) | ORAL | Status: DC
  Administered 2019-04-12 – 2019-04-14 (×5): 100 mg via ORAL
  Filled 2019-04-11 (×5): qty 5

## 2019-04-11 NOTE — Progress Notes (Signed)
PROGRESS NOTE    Nicholas Caldwell  ENI:[id number] DOB: Mar 04, 1946 DOA: 04/09/2019 PCP: [facility]    Brief Narrative:  73 y.o.malewith medical history significant ofatrial flutter, paroxysmal atrial fibrillation, CAD, chronic systolic congestive heart failure, COPDon 2-3 L home oxygen, type 2 diabetes, hypertension, hyperlipidemia presenting to the hospital for evaluation of altered mental status. History provided by EMS. Per report, he has been altered for the last 2 days. He was recently hospitalized and treated for a UTI. He has been off of his antibiotic for the last 2 days. Wife reported that patient refused to take his medications today and possibly yesterday as well. He is normally a little slow to answer questions.In the ED, initially on 3 L supplemental oxygen, subsequently required BiPAP for increased WOB.Noted to be wheezing.No leukocytosis. Lactic acid normal. Potassium >7.5.EKG without acute changes. Creatinine 1.3, improved since prior labs. UA and urine culture pending. Blood culture x2 pending. COVID-19 rapid test negative. Chest x-ray showing moderate to large left and small-to-moderate right pleural effusions with bibasilar areas of atelectasis and/or consolidation. Also showing cardiomegaly with probable pulmonary edema suggestive of CHF. Head CT negative for acute finding. Patient received albuterol, IV Lasix 40 mg, Solu-Medrol 125 mg, and cefepime in the ED. Nephrology consulted for severe hyperkalemia.   Previous hospitalization briefly reviewed, patient was hospitalized from 5/3 to 5/28.  At that time he was admitted with progressive dyspnea with elevated BNP greater than 1000, atrial fibrillation as well as onset of anemia.  Hospital course at that time was complicated by ischemic stroke, generalized weakness UTI, HCAP.  He had declined rehab placement and went home.   Assessment & Plan:   Principal Problem:   Acute respiratory failure (HCC)  Active Problems:   PAF (paroxysmal atrial fibrillation) (HCC)   Acute encephalopathy   Diabetes mellitus type 2 in obese (HCC)   Hyperkalemia  Acute on chronic hypoxic, hypercapnic respiratory failuresecondary to acute COPD exacerbation,acute exacerbation of chronic systolic CHF, and ?CAP -Chest x-ray showing moderate to large left and small-to-moderate right pleural effusions with bibasilar areas of atelectasis and/or consolidation. Also showing cardiomegaly with probable pulmonary edema suggestive of CHF -Patient uses 2-3 L supplemental oxygen at home, remains on bipap -COVID-19 rapid test negative  Acute on chronic systolic CHF  -BNP 6144. Pt had been continued on IV Lasix 40 mg twice daily. Monitor intake/output, daily weights, and fluid/sodium restriction.  At time of discharge at the end of May, patient was discharged home on torsemide 100 twice daily, potassium supplement, metolazone twice a week, BiDil, bisoprolol, spironolactone, Eliquis and Crestor.  -Appreciate input by Heart Failure Team. Per Heart Failure Team, OK to continue diuresis per Renal -Large mod-large B pleural effusions noted on imaging. Have ordered US guided thoracentesis  Acute COPD exacerbation -Wheezing as well at time of admission  -Patient is continued on Solu-Medrol,Duo nebs   Severe hyperkalemia -Spironolactone, potassium supplementation listed as home medication, held during this admission -Unable to take p.o. Lokelma, as patient is on BiPAP.  Patient given calcium gluconate, insulin/D50, sodium bicarb, IV Lasix -Nephrology was consulted and following -Potassium levels have since trended to normal  Acute metabolic encephalopathy -likely secondary to hypoxia/hypercapnia -Head CT negative for acute finding -Ammonia elevated at 67, no previous history of cirrhosis.  Unable to take oral lactulose due to BiPAP use  CKD stage III -Baseline creatinine creatinine at time of discharge on 5/28 was  in the range of 1.9-2 -Most recent creatinine of 1.32 noted  Elevated alkaline phosphatase -  Alkaline phosphatase 171, remainder of LFTs normal. GGT 111  Well-controlled type 2 diabetes -HA1c 6.8 on Mar 22, 2019 -continue with sliding scale insulin   Paroxysmal atrial fibrillation -Holding bisoprolol and Eliquis while on BiPAP.   -IV heparin while NPO on BiPAP  Medical Noncompliance -Known hx of gross medical and dietary noncompliance  -Patient will benefit from SNF, however has been refusing. Consideration for hospice  DVT prophylaxis: Heparin gtt Code Status: Full Family Communication: Pt in room, family not at bedside Disposition Plan: Uncertain at this time  Consultants:   Nephrology  Cardiology (Heart Failure Team)  Procedures:     Antimicrobials: Anti-infectives (From admission, onward)   Start     Dose/Rate Route Frequency Ordered Stop   04/10/19 0600  ceFEPIme (MAXIPIME) 2 g in sodium chloride 0.9 % 100 mL IVPB     2 g 200 mL/hr over 30 Minutes Intravenous Every 8 hours 04/10/19 0003     04/09/19 2130  ceFEPIme (MAXIPIME) 2 g in sodium chloride 0.9 % 100 mL IVPB     2 g 200 mL/hr over 30 Minutes Intravenous  Once 04/09/19 2124 04/09/19 2304       Subjective: Unable to fully assess, pt remains on BiPAP  Objective: Vitals:   04/11/19 0600 04/11/19 0807 04/11/19 0904 04/11/19 0906  BP: 137/65     Pulse: 83   80  Resp: (!) 31   (!) 23  Temp:  100.2 F (37.9 C)    TempSrc:  Axillary    SpO2: 91%  99% 99%  Weight:      Height:        Intake/Output Summary (Last 24 hours) at 04/11/2019 1231 Last data filed at 04/11/2019 0600 Gross per 24 hour  Intake 459.48 ml  Output 1350 ml  Net -890.52 ml   Filed Weights   04/10/19 0421 04/10/19 2018 04/11/19 0300  Weight: 123.9 kg 122.4 kg 122.8 kg    Examination:  General exam: Appears calm and comfortable  Respiratory system: Decreased BS, remains on bipap, increased resp effort Cardiovascular  system: S1 & S2 heard, RRR. Gastrointestinal system: Abdomen is nondistended, soft and nontender. No organomegaly or masses felt. Normal bowel sounds heard. Central nervous system: Alert,No focal neurological deficits. Extremities: Symmetric 5 x 5 power. Skin: No rashes, lesions Psychiatry: Unable to assess as pt remains on bipap  Data Reviewed: I have personally reviewed following labs and imaging studies  CBC: Recent Labs  Lab 04/09/19 1959 04/09/19 2017  WBC 6.0  --   NEUTROABS 4.2  --   HGB 9.9* 11.6*  HCT 37.3* 34.0*  MCV 95.2  --   PLT 319  --    Basic Metabolic Panel: Recent Labs  Lab 04/09/19 1959 04/09/19 2017 04/09/19 2226 04/10/19 0154 04/10/19 0652 04/10/19 1141 04/10/19 1636  NA 140 140  --  146* 144  --   --   K >7.5* 7.7* >7.5* 7.0*  6.9* 7.3* 6.3* 5.2*  CL 108 111  --  110 110  --   --   CO2 27  --   --  24 28  --   --   GLUCOSE 110* 110*  --  101* 121*  --   --   BUN 35* 33*  --  31* 33*  --   --   CREATININE 1.36* 1.40*  --  1.41* 1.32*  --   --   CALCIUM 8.9  --   --  9.4 9.4  --   --  GFR: Estimated Creatinine Clearance: 70.5 mL/min (A) (by C-G formula based on SCr of 1.32 mg/dL (H)). Liver Function Tests: Recent Labs  Lab 04/09/19 1959  AST 19  ALT 25  ALKPHOS 171*  BILITOT 0.2*  PROT 7.5  ALBUMIN 3.4*   No results for input(s): LIPASE, AMYLASE in the last 168 hours. Recent Labs  Lab 04/10/19 0154  AMMONIA 67*   Coagulation Profile: No results for input(s): INR, PROTIME in the last 168 hours. Cardiac Enzymes: Recent Labs  Lab 04/09/19 1959 04/10/19 1141 04/10/19 1636  TROPONINI <0.03 0.03* 0.03*   BNP (last 3 results) No results for input(s): PROBNP in the last 8760 hours. HbA1C: No results for input(s): HGBA1C in the last 72 hours. CBG: Recent Labs  Lab 04/10/19 1209 04/10/19 1548 04/10/19 2140 04/11/19 0650 04/11/19 1032  GLUCAP 149* 131* 155* 234* 164*   Lipid Profile: No results for input(s): CHOL, HDL,  LDLCALC, TRIG, CHOLHDL, LDLDIRECT in the last 72 hours. Thyroid Function Tests: Recent Labs    04/10/19 0154  TSH 0.587   Anemia Panel: Recent Labs    04/10/19 0154  VITAMINB12 337   Sepsis Labs: Recent Labs  Lab 04/09/19 1959 04/09/19 2226 04/10/19 0154  PROCALCITON  --   --  0.16  LATICACIDVEN 0.5 0.5  --     Recent Results (from the past 240 hour(s))  Culture, blood (routine x 2)     Status: None (Preliminary result)   Collection Time: 04/09/19  7:59 PM  Result Value Ref Range Status   Specimen Description   Final    BLOOD RIGHT ARM Performed at [facility] [address], [place], [place] [address]    Special Requests   Final    BOTTLES DRAWN AEROBIC ONLY Blood Culture results may not be optimal due to an inadequate volume of blood received in culture bottles Performed at [facility] [address], [place], [place] [address]    Culture   Final    NO GROWTH 2 DAYS Performed at [facility] [address] [place], [place] [address]    Report Status PENDING  Incomplete  Culture, blood (routine x 2)     Status: None (Preliminary result)   Collection Time: 04/09/19  7:59 PM  Result Value Ref Range Status   Specimen Description   Final    BLOOD RIGHT ARM Performed at [facility] [address] [place], [place] [address]    Special Requests   Final    BOTTLES DRAWN AEROBIC AND ANAEROBIC Blood Culture results may not be optimal due to an excessive volume of blood received in culture bottles Performed at [facility] [address] [place], [place] [address]    Culture   Final    NO GROWTH 2 DAYS Performed at [facility] [address], [place],  [address]    Report Status PENDING  Incomplete  SARS Coronavirus 2 (CEPHEID - Performed in [facility] hospital lab), Hosp Order     Status: None   Collection Time: 04/09/19  7:59 PM  Result Value Ref Range Status    SARS Coronavirus 2 NEGATIVE NEGATIVE Final    Comment: (NOTE) If result is NEGATIVE SARS-CoV-2 target nucleic acids are NOT DETECTED. The SARS-CoV-2 RNA is generally detectable in upper and lower  respiratory specimens during the acute phase of infection. The lowest  concentration of SARS-CoV-2 viral copies this assay can detect is 250  copies /  mL. A negative result does not preclude SARS-CoV-2 infection  and should not be used as the sole basis for treatment or other  patient management decisions.  A negative result may occur with  improper specimen collection / handling, submission of specimen other  than nasopharyngeal swab, presence of viral mutation(s) within the  areas targeted by this assay, and inadequate number of viral copies  (<250 copies / mL). A negative result must be combined with clinical  observations, patient history, and epidemiological information. If result is POSITIVE SARS-CoV-2 target nucleic acids are DETECTED. The SARS-CoV-2 RNA is generally detectable in upper and lower  respiratory specimens dur ing the acute phase of infection.  Positive  results are indicative of active infection with SARS-CoV-2.  Clinical  correlation with patient history and other diagnostic information is  necessary to determine patient infection status.  Positive results do  not rule out bacterial infection or co-infection with other viruses. If result is PRESUMPTIVE POSTIVE SARS-CoV-2 nucleic acids MAY BE PRESENT.   A presumptive positive result was obtained on the submitted specimen  and confirmed on repeat testing.  While 2019 novel coronavirus  (SARS-CoV-2) nucleic acids may be present in the submitted sample  additional confirmatory testing may be necessary for epidemiological  and / or clinical management purposes  to differentiate between  SARS-CoV-2 and other Sarbecovirus currently known to infect humans.  If clinically indicated additional testing with an alternate test   methodology [phone]) is advised. The SARS-CoV-2 RNA is generally  detectable in upper and lower respiratory sp ecimens during the acute  phase of infection. The expected result is Negative. Fact Sheet for Patients:  StrictlyIdeas.no Fact Sheet for Healthcare Providers: BankingDealers.co.za This test is not yet approved or cleared by the [place] FDA and has been authorized for detection and/or diagnosis of SARS-CoV-2 by FDA under an Emergency Use Authorization (EUA).  This EUA will remain in effect (meaning this test can be used) for the duration of the COVID-19 declaration under Section 564(b)(1) of the Act, 21 U.S.C. section 360bbb-3(b)(1), unless the authorization is terminated or revoked sooner. Performed at [facility], [place] [address] [place], [place] [address]   Urine culture     Status: None   Collection Time: 04/09/19 11:28 PM  Result Value Ref Range Status   Specimen Description   Final    URINE, RANDOM Performed at [facility] [address] [place], [place] [address]    Special Requests   Final    NONE Performed at [facility], [place] [address], [place], [place] [address]    Culture   Final    NO GROWTH Performed at [facility], [place] [address] [place], [place] [address]    Report Status 04/11/2019 FINAL  Final  MRSA PCR Screening     Status: None   Collection Time: 04/10/19  1:34 AM  Result Value Ref Range Status   MRSA by PCR NEGATIVE NEGATIVE Final    Comment:        The GeneXpert MRSA Assay (FDA approved for NASAL specimens only), is one component of a comprehensive MRSA colonization surveillance program. It is not intended to diagnose MRSA infection nor to guide or monitor treatment for MRSA infections. Performed at [facility], [place] [address] [place],  [address]      Radiology Studies: Ct Head Wo  Contrast  Result Date: 04/09/2019 CLINICAL DATA:  Altered level of consciousness over the past 2 days.  Bladder infection 2 weeks ago. Not taking antibiotics. EXAM: CT HEAD WITHOUT CONTRAST TECHNIQUE: Contiguous axial images were obtained from the base of the skull through the vertex without intravenous contrast. COMPARISON:  CT 03/18/2019 and MRI 03/20/2019 FINDINGS: Brain: Ventricles, cisterns and other CSF spaces are within normal. Cavum septum variant is present. Subtle chronic ischemic microvascular disease. Stable old small cerebellar infarcts. No mass, mass effect, shift of midline structures or acute hemorrhage. No evidence of acute infarction. Vascular: No hyperdense vessel or unexpected calcification. Skull: Normal. Negative for fracture or focal lesion. Sinuses/Orbits: Orbits are normal symmetric. Paranasal sinuses are normal. Other: None. IMPRESSION: No acute findings. Minimal chronic ischemic microvascular disease. Small old lacunar cerebellar infarcts. Electronically Signed   By: Marin Olp M.D.   On: 04/09/2019 21:15   Dg Chest Port 1 View  Result Date: 04/09/2019 CLINICAL DATA:  73 year old male with history of bladder infection 2 weeks ago. EXAM: PORTABLE CHEST 1 VIEW COMPARISON:  Chest x-ray 03/25/2019. FINDINGS: Previously noted right upper extremity PICC has been removed. Diffuse haziness and patchy interstitial opacities throughout the lungs bilaterally. Confluent opacity in the lung bases bilaterally (left greater than right), likely to reflect moderate to large left and small to moderate right pleural effusions with associated areas of bibasilar atelectasis and/or airspace consolidation. Enlargement of the cardiopericardial silhouette which appears very indistinct. Pulmonary vasculature is obscured. The patient is rotated to the left on today's exam, resulting in distortion of the mediastinal contours and reduced diagnostic sensitivity and specificity for mediastinal pathology. Aortic  atherosclerosis. IMPRESSION: 1. Moderate to large left and small to moderate right pleural effusions with bibasilar areas of atelectasis and/or consolidation. 2. Cardiomegaly with probable pulmonary edema, suggestive of congestive heart failure. 3. Aortic atherosclerosis. Electronically Signed   By: Vinnie Langton M.D.   On: 04/09/2019 21:20    Scheduled Meds: . chlorhexidine  15 mL Mouth Rinse BID  . insulin aspart  0-9 Units Subcutaneous TID WC  . ipratropium-albuterol  3 mL Nebulization Q6H  . isosorbide-hydrALAZINE  0.5 tablet Oral TID  . mouth rinse  15 mL Mouth Rinse q12n4p  . methylPREDNISolone (SOLU-MEDROL) injection  60 mg Intravenous Q8H   Continuous Infusions: . sodium chloride 5 mL/hr at 04/11/19 0600  . ceFEPime (MAXIPIME) IV 2 g (04/11/19 0640)  . heparin 1,400 Units/hr (04/11/19 0855)     LOS: 2 days   Marylu Lund, MD [facility] Pager On Amion  If 7PM-7AM, please contact night-coverage 04/11/2019, 12:31 PM

## 2019-04-11 NOTE — Progress Notes (Signed)
Pt requires a 1on1 sitter but does not fit a category 3. Tele-sitter  not suitable as pt is getting out of bed , and pulling on equipment .care continues.

## 2019-04-11 NOTE — Progress Notes (Addendum)
[facility] for Heparin  Indication: Bridge therapy while pt on BiPAP and apixaban for afib on hold, stroke on last admission  No Known Allergies  Patient Measurements: Height: 6' (182.9 cm) Weight: 270 lb 11.6 oz (122.8 kg) IBW/kg (Calculated) : 77.6 Heparin Dosing Weight: 103.3 kg  Vital Signs: Temp: 100.2 F (37.9 C) (06/08 0807) Temp Source: Axillary (06/08 0807) BP: 137/65 (06/08 0600) Pulse Rate: 80 (06/08 0906)  Labs: Recent Labs    04/09/19 1959 04/09/19 2017 04/10/19 0154 04/10/19 0652 04/10/19 1141 04/10/19 1340 04/10/19 1636 04/11/19 0212 04/11/19 1256  HGB 9.9* 11.6*  --   --   --   --   --   --   --   HCT 37.3* 34.0*  --   --   --   --   --   --   --   PLT 319  --   --   --   --   --   --   --   --   APTT  --   --   --   --   --  29  --  86* 86*  HEPARINUNFRC  --   --   --   --   --  0.79*  --  0.96*  --   CREATININE 1.36* 1.40* 1.41* 1.32*  --   --   --   --  1.50*  TROPONINI <0.03  --   --   --  0.03*  --  0.03*  --   --     Estimated Creatinine Clearance: 62 mL/min (A) (by C-G formula based on SCr of 1.5 mg/dL (H)).   Medical History: Past Medical History:  Diagnosis Date  . Atrial flutter ([place])    a. recurrent AFlutter with RVR;  b. Amiodarone Rx started 4/16  . CAD (coronary artery disease)    a. LHC 1/16:  mLAD diffuse disease, pLCx mild disease, dLCx with disease but too small for PCI, RCA ok, EF 25-30%  . Chronic pain   . Chronic systolic CHF (congestive heart failure) ([place])   . COPD (chronic obstructive pulmonary disease) ([place])   . Diabetes mellitus without complication ([place])   . Hypercholesteremia   . Hypertension   . NICM (nonischemic cardiomyopathy) ([place])    a.dx 2016. b. 2D echo 06/2016 - Last echo 07/01/16: mod dilated LV, mod LVH, EF 25-30%, mild-mod MR, sev LAE, mild-mod reduced RV systolic function, mild-mod TR, PASP 69mHG.  .Marland KitchenPAF (paroxysmal atrial fibrillation) (HCC)    On amio - ot a  candidate for flecainide due to cardiomyopathy, not a candidate for Tikosyn due to prolonged QT, and felt to be a poor candidate for ablation given left atrial size.  . Pulmonary hypertension ([place]   . Tobacco abuse    Assessment: 73yo M on apixaban 5 mg po bid PTA for Afib. Pharmacy consulted for heparin for bridge therapy while pt unable to take apixaban due to BiPAP.  Pt w/ stroke last admission. Stroke felt to be thromboembolic in the setting of afib and holding anticoag for GIB. HL artificially elevated given recent Eliquis use.  aPTT remains above goal at 86 sec on heparin 1400 units/hr. No signs/symptoms of bleeding or issues with infusion reported by nurse.  Goal of Therapy:  APTT 66 - 84 sec Heparin level 0.3 - 0.5 due to recent stroke Monitor platelets by anticoagulation protocol: Yes   Plan:  Decrease heparin slightly to 1350 units/hr  Re-check aPTT tomorrow with AM labs   Claiborne Billings, PharmD PGY2 Cardiology Pharmacy Resident Please check AMION for all Pharmacist numbers by unit 04/11/2019 1:48 PM

## 2019-04-11 NOTE — Progress Notes (Signed)
Nicholas Caldwell for Heparin  Indication: Bridge therapy while pt on BiPAP and apixaban for afib on hold, stroke on last admission  No Known Allergies  Patient Measurements: Height: 6' (182.9 cm) Weight: 269 lb 13.5 oz (122.4 kg) IBW/kg (Calculated) : 77.6 Heparin Dosing Weight: 103.3 kg  Vital Signs: Temp: 97.5 F (36.4 C) (06/08 0000) Temp Source: Oral (06/08 0000) BP: 128/58 (06/08 0200) Pulse Rate: 88 (06/08 0200)  Labs: Recent Labs    04/09/19 1959 04/09/19 2017 04/10/19 0154 04/10/19 0652 04/10/19 1141 04/10/19 1340 04/10/19 1636 04/11/19 0212  HGB 9.9* 11.6*  --   --   --   --   --   --   HCT 37.3* 34.0*  --   --   --   --   --   --   PLT 319  --   --   --   --   --   --   --   APTT  --   --   --   --   --  29  --  86*  HEPARINUNFRC  --   --   --   --   --  0.79*  --  0.96*  CREATININE 1.36* 1.40* 1.41* 1.32*  --   --   --   --   TROPONINI <0.03  --   --   --  0.03*  --  0.03*  --     Estimated Creatinine Clearance: 70.3 mL/min (A) (by C-G formula based on SCr of 1.32 mg/dL (H)).   Medical History: Past Medical History:  Diagnosis Date  . Atrial flutter ([place])    a. recurrent AFlutter with RVR;  b. Amiodarone Rx started 4/16  . CAD (coronary artery disease)    a. LHC 1/16:  mLAD diffuse disease, pLCx mild disease, dLCx with disease but too small for PCI, RCA ok, EF 25-30%  . Chronic pain   . Chronic systolic CHF (congestive heart failure) ([place])   . COPD (chronic obstructive pulmonary disease) ([place])   . Diabetes mellitus without complication ([place])   . Hypercholesteremia   . Hypertension   . NICM (nonischemic cardiomyopathy) ([place])    a.dx 2016. b. 2D echo 06/2016 - Last echo 07/01/16: mod dilated LV, mod LVH, EF 25-30%, mild-mod MR, sev LAE, mild-mod reduced RV systolic function, mild-mod TR, PASP 30mHG.  .Marland KitchenPAF (paroxysmal atrial fibrillation) (HCC)    On amio - ot a candidate for flecainide due to cardiomyopathy, not a  candidate for Tikosyn due to prolonged QT, and felt to be a poor candidate for ablation given left atrial size.  . Pulmonary hypertension ([place]   . Tobacco abuse    Assessment: 73yo M on apixaban 5 mg po bid PTA for Afib.  Pharmacy consulted for heparin for bridge therapy while pt unable to take apixaban due to BiPAP.  Pt w/ stroke last admission.  Wt 123.9 kg, HDW 103.3 kg. Hg 11.6, PLCT WNL.  Scr 1.32.  PTA apixa 5 mg po BID: Last dose: "past week at unknown time" Stroke 03/18/19 on recent admission < 1 month ago. Stroke felt to be thromboembolic in the setting of afib and holding anticoag for GIB. Baseline heparin level elevated at 0.79 due to recent apixaban. Baseline aPTT is 29 sec WNL.  6/8 AM update: aPTT is just above goal  Goal of Therapy:  APTT 66 - 84 sec Heparin level 0.3 - 0.5 due to recent stroke Monitor platelets  by anticoagulation protocol: Yes   Plan:  Dec heparin slightly to 1400 units/hr Re-check aPTT in 8 hours  Nicholas Caldwell, Nicholas Caldwell 04/11/2019,3:22 AM

## 2019-04-11 NOTE — Progress Notes (Signed)
Patient placed off CPAP and on to Bipap machine. Patient is tolerating bipap machine better. 16/6 40% RT will continue to monitor.

## 2019-04-11 NOTE — Consult Note (Signed)
Advanced Heart Failure Team Consult Note   Primary Physician: [facility] PCP-Cardiologist:  No primary care provider on file.  Reason for Consultation: HF  HPI:    Nicholas Caldwell is seen today for evaluation of HF at the request of Dr. Marlowe Sax.   Nicholas Caldwell a 73 y.o.malePMH of chronic systolic CHF due to NICM (EF 40-45%) with moderate RV failure, COPD on 3 L O2, DM, HTN, OHS/OSA on CPAP, PAF s/p DCCV, hyperlipidemia, CAD, and noncompliance. He has multiple admissions over past year due to HF and respiratory failure.   Most recently admitted from 03/07/19-03/31/19 with massive volume overload with severe RV failure, AFL and cardiorenal syndrome. Attempts at diuresis limited by worsening creatinine which peaked at 2.6. Treated with milrinone but still unable to get CVP below 15. Hospital course c/b severe non-compliance with high fluid intake, PAFL, penile wound ,acute versus subacute CVA and Citrobacter UTII. Underwent DC-CV of AFL into NSR.  D/c'd home on 5/28 with weight of 270 and creatinine 1.99. Readmitted over the weekend with recurrent hypoxic/hypercarbic respiratory failure. (ABG 7.27/66/55/86%). Found to have severe hyperkalemia (K>7.5) and large left pleural effusion. Weight up about 10 pounds since d/c. Seen by renal and received Kayexalate and started on IV lasix 80 IV q6. Patient poor historian but notes say he has been non compliant with meds and CPAP.   Currently he is on Bipap. Awake and communicative but somewhat confused. In soft restraints with a sitter.    Review of Systems: [y] = yes, _0  = no  - unable to get accurate ROS due to confusion   Home Medications Prior to Admission medications   Medication Sig Start Date End Date Taking? Authorizing Provider  albuterol (VENTOLIN HFA) 108 (90 Base) MCG/ACT inhaler Inhale 2 puffs into the lungs every 6 (six) hours as needed for wheezing or shortness of breath.   Yes [provider]  apixaban  (ELIQUIS) 5 MG TABS tablet Take 1 tablet (5 mg total) by mouth 2 (two) times daily. 03/31/19  Yes Kayleen Memos, DO  bisoprolol (ZEBETA) 5 MG tablet Take 0.5 tablets (2.5 mg total) by mouth daily. 03/31/19  Yes Hall, Carole N, DO  budesonide-formoterol (SYMBICORT) 160-4.5 MCG/ACT inhaler Inhale 2 puffs into the lungs 2 (two) times daily.   Yes [provider]  diazepam (VALIUM) 5 MG tablet Take 5 mg by mouth daily as needed for anxiety.   Yes [provider]  ferrous sulfate 325 (65 FE) MG tablet Take 1 tablet (325 mg total) by mouth 2 (two) times daily with a meal. 03/31/19  Yes Hall, Carole N, DO  fluticasone (FLONASE) 50 MCG/ACT nasal spray Place 2 sprays into both nostrils daily as needed for allergies.    Yes [provider]  gabapentin (NEURONTIN) 600 MG tablet Take 1 tablet (600 mg total) by mouth 2 (two) times daily. 01/24/19  Yes Angiulli, Lavon Paganini, PA-C  guaifenesin (HUMIBID E) 400 MG TABS tablet Take 1 tablet (400 mg total) by mouth every 6 (six) hours as needed. Patient taking differently: Take 400 mg by mouth every 6 (six) hours as needed (cough).  08/02/18  Yes Regalado, Belkys A, MD  hydrocortisone (ANUSOL-HC) 2.5 % rectal cream Place 1 application rectally 2 (two) times daily as needed for hemorrhoids or itching.    Yes [provider]  hydrocortisone 1 % lotion Apply 1 application topically See admin instructions. Apply small amount to back twice daily for itchy rash   Yes [provider]  insulin aspart (NOVOLOG) 100 UNIT/ML injection Inject 4 Units into the skin 3 (three) times daily with meals. 03/31/19  Yes Hall, Carole N, DO  insulin aspart protamine - aspart (NOVOLOG MIX 70/30 FLEXPEN) (70-30) 100 UNIT/ML FlexPen Inject 0.1 mLs (10 Units total) into the skin 2 (two) times daily. 03/31/19  Yes Hall, Carole N, DO  ipratropium-albuterol (DUONEB) 0.5-2.5 (3) MG/3ML SOLN Take 3 mLs by nebulization 2 (two) times daily as needed (shortness of  breath/wheezing).    Yes [provider]  isosorbide-hydrALAZINE (BIDIL) 20-37.5 MG tablet Take 0.5 tablets by mouth 3 (three) times daily. 03/31/19  Yes Hall, Carole N, DO  latanoprost (XALATAN) 0.005 % ophthalmic solution Place 1 drop into both eyes at bedtime.   Yes [provider]  metolazone (ZAROXOLYN) 2.5 MG tablet Take 1 tablet (2.5 mg total) by mouth every Wednesday. Take extra Potassium 84mq when taking this medication  **Please also take Metolazone 2.5 mg Every Friday and Take extra Potassium 253m when taking this medication.** Patient taking differently: Take 2.5 mg by mouth every Wednesday. Take extra Potassium 2034mwhen taking this medication  **Please also take Metolazone 2.5 mg Every Friday and Take extra Potassium 53m83mhen taking this medication.** 04/06/19  Yes Hall, Carole N, DO  OXYGEN Inhale 3 L into the lungs continuous.    Yes [provider]  pantoprazole (PROTONIX) 40 MG tablet Take 1 tablet (40 mg total) by mouth 2 (two) times daily. 01/24/19  Yes Angiulli, DaniLavon Paganini-C  polyethylene glycol (MIRALAX / GLYCOLAX) packet Take 17 g by mouth daily. Patient taking differently: Take 17 g by mouth daily as needed (constipation).  01/24/19  Yes Angiulli, DaniLavon Paganini-C  Potassium Chloride ER 20 MEQ TBCR Take 40 mEq by mouth 3 (three) times daily. 03/31/19  Yes HallKayleen Memos  rosuvastatin (CRESTOR) 20 MG tablet Take 1 tablet (20 mg total) by mouth daily at 6 PM. 03/31/19  Yes HallKayleen Memos  sodium chloride (OCEAN) 0.65 % SOLN nasal spray Place 2 sprays into both nostrils as needed for congestion.    Yes [provider]  spironolactone (ALDACTONE) 25 MG tablet Take 1 tablet (25 mg total) by mouth daily. 03/31/19  Yes HallKayleen Memos  torsemide (DEMADEX) 100 MG tablet Take 1 tablet (100 mg total) by mouth 2 (two) times daily. 03/31/19  Yes HallKayleen Memos    Past Medical History: Past Medical History:  Diagnosis Date  . Atrial  flutter (HCC)[place] a. recurrent AFlutter with RVR;  b. Amiodarone Rx started 4/16  . CAD (coronary artery disease)    a. LHC 1/16:  mLAD diffuse disease, pLCx mild disease, dLCx with disease but too small for PCI, RCA ok, EF 25-30%  . Chronic pain   . Chronic systolic CHF (congestive heart failure) (HCC)[place]. COPD (chronic obstructive pulmonary disease) (HCC)[place]. Diabetes mellitus without complication (HCC)[place]. Hypercholesteremia   . Hypertension   . NICM (nonischemic cardiomyopathy) (HCC)[place] a.dx 2016. b. 2D echo 06/2016 - Last echo 07/01/16: mod dilated LV, mod LVH, EF 25-30%, mild-mod MR, sev LAE, mild-mod reduced RV systolic function, mild-mod TR, PASP 54mm18m . PAFMarland Kitchen(paroxysmal atrial fibrillation) (HCC)    On amio - ot a candidate for flecainide due to cardiomyopathy, not a candidate for Tikosyn due to prolonged QT, and felt to be a poor candidate for ablation given left atrial size.  .Marland Kitchen  Pulmonary hypertension ([place])   . Tobacco abuse     Past Surgical History: Past Surgical History:  Procedure Laterality Date  . BIOPSY  03/11/2019   Procedure: BIOPSY;  Surgeon: Lavena Bullion, DO;  Location: [place];  Service: Gastroenterology;;  . BIOPSY  03/15/2019   Procedure: BIOPSY;  Surgeon: Irving Copas., MD;  Location: [place];  Service: Gastroenterology;;  . CARDIOVERSION N/A 07/30/2018   Procedure: CARDIOVERSION;  Surgeon: Larey Dresser, MD;  Location: [facility] ENDOSCOPY;  Service: Cardiovascular;  Laterality: N/A;  . COLONOSCOPY N/A 03/11/2019   Procedure: COLONOSCOPY;  Surgeon: Lavena Bullion, DO;  Location: [place];  Service: Gastroenterology;  Laterality: N/A;  . ENTEROSCOPY N/A 03/15/2019   Procedure: ENTEROSCOPY;  Surgeon: Rush Landmark Telford Nab., MD;  Location: [place];  Service: Gastroenterology;  Laterality: N/A;  . ESOPHAGOGASTRODUODENOSCOPY Left 03/11/2019   Procedure: ESOPHAGOGASTRODUODENOSCOPY (EGD);  Surgeon: Lavena Bullion, DO;  Location: [facility]  ENDOSCOPY;  Service: Gastroenterology;  Laterality: Left;  . LEFT AND RIGHT HEART CATHETERIZATION WITH CORONARY ANGIOGRAM N/A 11/06/2014   Procedure: LEFT AND RIGHT HEART CATHETERIZATION WITH CORONARY ANGIOGRAM;  Surgeon: Jettie Booze, MD;  Location: [facility] CATH LAB;  Service: Cardiovascular;  Laterality: N/A;  . RIGHT/LEFT HEART CATH AND CORONARY ANGIOGRAPHY N/A 12/06/2018   Procedure: RIGHT/LEFT HEART CATH AND CORONARY ANGIOGRAPHY;  Surgeon: Larey Dresser, MD;  Location: [place] CV LAB;  Service: Cardiovascular;  Laterality: N/A;  . SPLENECTOMY    . SUBMUCOSAL TATTOO INJECTION  03/15/2019   Procedure: SUBMUCOSAL TATTOO INJECTION;  Surgeon: Rush Landmark Telford Nab., MD;  Location: [facility] ENDOSCOPY;  Service: Gastroenterology;;    Family History: Family History  Problem Relation Age of Onset  . Heart disease Mother   . Hypertension Mother   . Heart failure Mother   . Heart disease Father   . Hypertension Sister   . Heart attack Neg Hx   . Stroke Neg Hx   . Colon cancer Neg Hx   . Esophageal cancer Neg Hx     Social History: Social History   Socioeconomic History  . Marital status: Married    Spouse name: Not on file  . Number of children: Not on file  . Years of education: Not on file  . Highest education level: Not on file  Occupational History  . Not on file  Social Needs  . Financial resource strain: Not on file  . Food insecurity:    Worry: Not on file    Inability: Not on file  . Transportation needs:    Medical: Not on file    Non-medical: Not on file  Tobacco Use  . Smoking status: Former Smoker    Packs/day: 1.00    Years: 34.00    Pack years: 34.00    Types: Cigarettes    Last attempt to quit: 11/30/2018    Years since quitting: 0.3  . Smokeless tobacco: Never Used  Substance and Sexual Activity  . Alcohol use: No    Alcohol/week: 0.0 standard drinks  . Drug use: No  . Sexual activity: Not on file  Lifestyle  . Physical activity:    Days per week:  Not on file    Minutes per session: Not on file  . Stress: Not on file  Relationships  . Social connections:    Talks on phone: Not on file    Gets together: Not on file    Attends religious service: Not on file    Active member of club or organization: Not  on file    Attends meetings of clubs or organizations: Not on file    Relationship status: Not on file  Other Topics Concern  . Not on file  Social History Narrative  . Not on file    Allergies:  No Known Allergies  Objective:    Vital Signs:   Temp:  [97.5 F (36.4 C)-100.2 F (37.9 C)] 100.2 F (37.9 C) (06/08 0807) Pulse Rate:  [61-88] 80 (06/08 0906) Resp:  [12-35] 23 (06/08 0906) BP: (109-189)/(40-91) 137/65 (06/08 0600) SpO2:  [90 %-100 %] 99 % (06/08 0906) FiO2 (%):  [35 %-40 %] 40 % (06/08 0904) Weight:  [122.4 kg-122.8 kg] 122.8 kg (06/08 0300) Last BM Date: 04/11/19  Weight change: Filed Weights   04/10/19 0421 04/10/19 2018 04/11/19 0300  Weight: 123.9 kg 122.4 kg 122.8 kg    Intake/Output:   Intake/Output Summary (Last 24 hours) at 04/11/2019 0924 Last data filed at 04/11/2019 0600 Gross per 24 hour  Intake 459.48 ml  Output 2350 ml  Net -1890.52 ml      Physical Exam    General:  Morbidly obese male. Lying flat in bed on Bipap.  HEENT: normal +BIPAP Neck: supple. JVP unable to see . Carotids 2+ bilat; no bruits. No lymphadenopathy or thyromegaly appreciated. Cor: PMI nondisplaced. Regular rate & rhythm. No rubs, gallops or murmurs. Lungs: clear dull left 1/2 up  Abdomen: markedly obese soft, nontender, nondistended. No hepatosplenomegaly. No bruits or masses. Good bowel sounds. Extremities: no cyanosis, clubbing, rash, edema Neuro: alert & awake. Moves all 4. Not oriented    Telemetry   Sinus 80s. Personally reviewed   EKG    Sinus brady 59 iRBBB. Nonspecific ST scooping Personally reviewed   Labs   Basic Metabolic Panel: Recent Labs  Lab 04/09/19 1959 04/09/19 2017 04/09/19  2226 04/10/19 0154 04/10/19 0652 04/10/19 1141 04/10/19 1636  NA 140 140  --  146* 144  --   --   K >7.5* 7.7* >7.5* 7.0*  6.9* 7.3* 6.3* 5.2*  CL 108 111  --  110 110  --   --   CO2 27  --   --  24 28  --   --   GLUCOSE 110* 110*  --  101* 121*  --   --   BUN 35* 33*  --  31* 33*  --   --   CREATININE 1.36* 1.40*  --  1.41* 1.32*  --   --   CALCIUM 8.9  --   --  9.4 9.4  --   --     Liver Function Tests: Recent Labs  Lab 04/09/19 1959  AST 19  ALT 25  ALKPHOS 171*  BILITOT 0.2*  PROT 7.5  ALBUMIN 3.4*   No results for input(s): LIPASE, AMYLASE in the last 168 hours. Recent Labs  Lab 04/10/19 0154  AMMONIA 67*    CBC: Recent Labs  Lab 04/09/19 1959 04/09/19 2017  WBC 6.0  --   NEUTROABS 4.2  --   HGB 9.9* 11.6*  HCT 37.3* 34.0*  MCV 95.2  --   PLT 319  --     Cardiac Enzymes: Recent Labs  Lab 04/09/19 1959 04/10/19 1141 04/10/19 1636  TROPONINI <0.03 0.03* 0.03*    BNP: BNP (last 3 results) Recent Labs    12/30/18 1621 03/06/19 1829 04/09/19 1959  BNP 1,273.0* 1,020.1* 1,843.4*    ProBNP (last 3 results) No results for input(s): PROBNP in the last 8760 hours.  CBG: Recent Labs  Lab 04/10/19 0806 04/10/19 1209 04/10/19 1548 04/10/19 2140 04/11/19 0650  GLUCAP 97 149* 131* 155* 234*    Coagulation Studies: No results for input(s): LABPROT, INR in the last 72 hours.   Imaging    No results found.   Medications:     Current Medications: . chlorhexidine  15 mL Mouth Rinse BID  . furosemide  80 mg Intravenous Q6H  . insulin aspart  0-9 Units Subcutaneous TID WC  . ipratropium-albuterol  3 mL Nebulization Q6H  . isosorbide-hydrALAZINE  0.5 tablet Oral TID  . mouth rinse  15 mL Mouth Rinse q12n4p  . methylPREDNISolone (SOLU-MEDROL) injection  60 mg Intravenous Q8H     Infusions: . sodium chloride 5 mL/hr at 04/11/19 0600  . ceFEPime (MAXIPIME) IV 2 g (04/11/19 0640)  . heparin 1,400 Units/hr (04/11/19 0855)       Assessment/Plan   1. Acute on chronic hypoxic/hypercarbic respiratory failure - multifactorial but likely due mostly to OHS/OSA, severe COPS (on 3L home O2) and left pleural effusion - TEE 01/10/19 LVEF 40-45%. RV moderately reduced function - on exam HF seems much improved from previous admission and I do not think fluid overload is a major issue here - Now on Bipap and cefipime/steroids/nebs for possible COPD flare - Agree with treatment for OHS/OSA and drainage of left effusion - Can continue with attempts at diuresis per Renal but during last admission unable to get CVP < 15 despite very aggressive diuretic regimen and inotropic support. I would NOT re-attempt inotropic support as it did not help much and he is not candidate for home inotropes - He has had multiple recent admissions with CHF and COPD exacerbations.He has not cooperated with paramedicine or [facility] home health as outpatient, has not been using CPAP He is completely unable to care for himself at home. If refusing SNF only real option is Hospice - Consider Pulmonary involvement  2. Acute on chronic systolic biventricular HF with R>>L symptoms - see discussion above. Doubt HF is major issue here.  - agree with attempts at IV diuresis but he has severe cardiorenal syndrome and will need to watch closely. - not a candidate for inotropic support  3. Severe hyperkalemia - admitted with K > 7.5. now improving. Renal managing  4. CKD, stage 3 - Creatinine much improved currently. During recent hospitalization ranged from 1.6->2.6 even with milrinone support. On d/c 5/28 was 1.99  5. PAFL - now in NSR s/p DC-CV last admission. Now on heparin for possible thoracentesis  6. Large left pleural effusion - will likely need thoracentesis.  - Primary team managing   7. Metabolic encephalopathy - per primary team  8. CAD - nonobnstructive - off ASA with Eliquis. Resume statin when taking po   Length of Stay: 2  Glori Bickers, MD  04/11/2019, 9:24 AM  Advanced Heart Failure Team Pager [phone] (M-F; 7a - 4p)  Please contact [facility] for night-coverage after hours (4p -7a ) and weekends on [email or web address]

## 2019-04-11 NOTE — Progress Notes (Signed)
[facility] Progress Note  Subjective: on bipap, Ox 3. K + down to 5.3  Vitals:   04/11/19 0600 04/11/19 0807 04/11/19 0904 04/11/19 0906  BP: 137/65     Pulse: 83   80  Resp: (!) 31   (!) 23  Temp:  100.2 F (37.9 C)    TempSrc:  Axillary    SpO2: 91%  99% 99%  Weight:      Height:        Inpatient medications: . chlorhexidine  15 mL Mouth Rinse BID  . furosemide  80 mg Intravenous Q6H  . insulin aspart  0-9 Units Subcutaneous TID WC  . ipratropium-albuterol  3 mL Nebulization Q6H  . isosorbide-hydrALAZINE  0.5 tablet Oral TID  . mouth rinse  15 mL Mouth Rinse q12n4p  . methylPREDNISolone (SOLU-MEDROL) injection  60 mg Intravenous Q8H   . sodium chloride 5 mL/hr at 04/11/19 0600  . ceFEPime (MAXIPIME) IV 2 g (04/11/19 0640)  . heparin 1,400 Units/hr (04/11/19 0855)   sodium chloride, acetaminophen **OR** acetaminophen, albuterol, hydrALAZINE, lactulose, morphine injection    Exam: Alert, "2020", "Nicholas Caldwell", on bipap  no jvd  Chest cta bilat  Cor reg no RG  Abd soft ntnd  Ext no sig LE edema  NF, moves all 4 ext   Assessment/Plan: 1.  CKD 3 w hyperkalemia: Likely iatrogenic from ongoing potassium replacement/spironolactone in this patient with diabetes and possible distal tubular potassium handling defect/RTA.  K+ better, 5.3 today after kayexalate and acute Rx (IV Ca, glu , insulin) yesterday.  Creat 1.4 which is baseline 1.2- 2.0.  Patient w/ chronic CHF.  Patient was on large amounts of po KCl at home.  No further suggestions.  Will sign off.   2.  Acute resp failure: prob CHF exacerbation. On bipap, on diuretics 3.  Hypernatremia: better 4.  Metabolic encephalopathy: seems better    [facility] 04/11/2019, 10:52 AM  Iron/TIBC/Ferritin/ %Sat    Component Value Date/Time   IRON 12 (L) 03/06/2019 2103   TIBC 437 03/06/2019 2103   FERRITIN 8 (L) 03/06/2019 2103   IRONPCTSAT 3 (L) 03/06/2019 2103   Recent Labs   Lab 04/09/19 1959  04/10/19 0652  04/10/19 1636  NA 140   < > 144  --   --   K >7.5*   < > 7.3*   < > 5.2*  CL 108   < > 110  --   --   CO2 27   < > 28  --   --   GLUCOSE 110*   < > 121*  --   --   BUN 35*   < > 33*  --   --   CREATININE 1.36*   < > 1.32*  --   --   CALCIUM 8.9   < > 9.4  --   --   ALBUMIN 3.4*  --   --   --   --    < > = values in this interval not displayed.   Recent Labs  Lab 04/09/19 1959  AST 19  ALT 25  ALKPHOS 171*  BILITOT 0.2*  PROT 7.5   Recent Labs  Lab 04/09/19 1959 04/09/19 2017  WBC 6.0  --   HGB 9.9* 11.6*  HCT 37.3* 34.0*  PLT 319  --

## 2019-04-12 ENCOUNTER — Inpatient Hospital Stay (HOSPITAL_COMMUNITY): Payer: Non-veteran care

## 2019-04-12 LAB — BASIC METABOLIC PANEL
Anion gap: 13 (ref 5–15)
BUN: 38 mg/dL — ABNORMAL HIGH (ref 8–23)
CO2: 33 mmol/L — ABNORMAL HIGH (ref 22–32)
Calcium: 8.5 mg/dL — ABNORMAL LOW (ref 8.9–10.3)
Chloride: 100 mmol/L (ref 98–111)
Creatinine, Ser: 1.44 mg/dL — ABNORMAL HIGH (ref 0.61–1.24)
GFR calc Af Amer: 57 mL/min — ABNORMAL LOW (ref >60)
GFR calc non Af Amer: 49 mL/min — ABNORMAL LOW (ref >60)
Glucose, Bld: 259 mg/dL — ABNORMAL HIGH (ref 70–99)
Potassium: 3.1 mmol/L — ABNORMAL LOW (ref 3.5–5.1)
Sodium: 146 mmol/L — ABNORMAL HIGH (ref 135–145)

## 2019-04-12 LAB — GLUCOSE, CAPILLARY
Glucose-Capillary: 215 mg/dL — ABNORMAL HIGH (ref 70–99)
Glucose-Capillary: 195 mg/dL — ABNORMAL HIGH (ref 70–99)
Glucose-Capillary: 227 mg/dL — ABNORMAL HIGH (ref 70–99)
Glucose-Capillary: 181 mg/dL — ABNORMAL HIGH (ref 70–99)

## 2019-04-12 LAB — CBC
HCT: 30.1 % — ABNORMAL LOW (ref 39.0–52.0)
Hemoglobin: 9.2 g/dL — ABNORMAL LOW (ref 13.0–17.0)
MCH: 25.4 pg — ABNORMAL LOW (ref 26.0–34.0)
MCHC: 30.6 g/dL (ref 30.0–36.0)
MCV: 83.1 fL (ref 80.0–100.0)
Platelets: 318 10*3/uL (ref 150–400)
RBC: 3.62 MIL/uL — ABNORMAL LOW (ref 4.22–5.81)
RDW: 22.4 % — ABNORMAL HIGH (ref 11.5–15.5)
WBC: 5.1 10*3/uL (ref 4.0–10.5)
nRBC: 0.4 % — ABNORMAL HIGH (ref 0.0–0.2)

## 2019-04-12 LAB — HEPARIN LEVEL (UNFRACTIONATED): Heparin Unfractionated: 0.72 IU/mL — ABNORMAL HIGH (ref 0.30–0.70)

## 2019-04-12 LAB — APTT: aPTT: 87 seconds — ABNORMAL HIGH (ref 24–36)

## 2019-04-12 MED ORDER — POTASSIUM CHLORIDE CRYS ER 20 MEQ PO TBCR
40.0000 meq | EXTENDED_RELEASE_TABLET | Freq: Two times a day (BID) | ORAL | Status: AC
  Administered 2019-04-12 (×2): 40 meq via ORAL
  Filled 2019-04-12 (×2): qty 2

## 2019-04-12 MED ORDER — DIAZEPAM 5 MG PO TABS: 5.0000 mg | ORAL_TABLET | Freq: Every day | ORAL | Status: DC | PRN

## 2019-04-12 MED ORDER — FERROUS SULFATE 325 (65 FE) MG PO TABS
325.0000 mg | ORAL_TABLET | Freq: Two times a day (BID) | ORAL | Status: DC
  Administered 2019-04-12 – 2019-04-14 (×5): 325 mg via ORAL
  Filled 2019-04-12 (×5): qty 1

## 2019-04-12 MED ORDER — LIDOCAINE HCL 1 % IJ SOLN
INTRAMUSCULAR | Status: AC
  Filled 2019-04-12: qty 20

## 2019-04-12 NOTE — Progress Notes (Signed)
[facility] for Heparin  Indication: Bridge therapy while pt on BiPAP and apixaban for afib on hold, stroke on last admission  No Known Allergies  Patient Measurements: Height: 6' (182.9 cm) Weight: 251 lb 12.3 oz (114.2 kg) IBW/kg (Calculated) : 77.6 Heparin Dosing Weight: 103.3 kg  Vital Signs: Temp: 99 F (37.2 C) (06/09 0355) Temp Source: Axillary (06/09 0355) BP: 138/56 (06/09 0355) Pulse Rate: 79 (06/09 0355)  Labs: Recent Labs    04/09/19 1959 04/09/19 2017  04/10/19 0652 04/10/19 1141  04/10/19 1340 04/10/19 1636 04/11/19 0212 04/11/19 1256 04/12/19 0231  HGB 9.9* 11.6*  --   --   --   --   --   --   --   --  9.2*  HCT 37.3* 34.0*  --   --   --   --   --   --   --   --  30.1*  PLT 319  --   --   --   --   --   --   --   --   --  318  APTT  --   --   --   --   --    < > 29  --  86* 86* 87*  HEPARINUNFRC  --   --   --   --   --   --  0.79*  --  0.96*  --  0.72*  CREATININE 1.36* 1.40*   < > 1.32*  --   --   --   --   --  1.50* 1.44*  TROPONINI <0.03  --   --   --  0.03*  --   --  0.03*  --   --   --    < > = values in this interval not displayed.    Estimated Creatinine Clearance: 62.2 mL/min (A) (by C-G formula based on SCr of 1.44 mg/dL (H)).   Medical History: Past Medical History:  Diagnosis Date  . Atrial flutter ([place])    a. recurrent AFlutter with RVR;  b. Amiodarone Rx started 4/16  . CAD (coronary artery disease)    a. LHC 1/16:  mLAD diffuse disease, pLCx mild disease, dLCx with disease but too small for PCI, RCA ok, EF 25-30%  . Chronic pain   . Chronic systolic CHF (congestive heart failure) ([place])   . COPD (chronic obstructive pulmonary disease) ([place])   . Diabetes mellitus without complication ([place])   . Hypercholesteremia   . Hypertension   . NICM (nonischemic cardiomyopathy) ([place])    a.dx 2016. b. 2D echo 06/2016 - Last echo 07/01/16: mod dilated LV, mod LVH, EF 25-30%, mild-mod MR, sev LAE, mild-mod  reduced RV systolic function, mild-mod TR, PASP 67mHG.  .Marland KitchenPAF (paroxysmal atrial fibrillation) (HCC)    On amio - ot a candidate for flecainide due to cardiomyopathy, not a candidate for Tikosyn due to prolonged QT, and felt to be a poor candidate for ablation given left atrial size.  . Pulmonary hypertension ([place]   . Tobacco abuse    Assessment: 73yo M on apixaban 5 mg po bid PTA for Afib. Pharmacy consulted for heparin for bridge therapy while pt unable to take apixaban due to BiPAP.  Pt w/ stroke last admission. Stroke felt to be thromboembolic in the setting of afib and holding anticoag for GIB. HL artificially elevated given recent Eliquis use.  aPTT remains slightly above goal at 87 sec on heparin  1350 units/hr. Hgb ow but relatively stable. HL artificially elevated given recent Eliquis use, will likely correlate tomorrow. No signs/symptoms of bleeding or issues with infusion reported by nurse.  Goal of Therapy:  APTT 66 - 84 sec Heparin level 0.3 - 0.5 due to recent stroke Monitor platelets by anticoagulation protocol: Yes   Plan:  Decrease heparin slightly to 1300 units/hr Re-check aPTT tomorrow with AM labs   Claiborne Billings, PharmD PGY2 Cardiology Pharmacy Resident Please check AMION for all Pharmacist numbers by unit 04/12/2019 8:12 AM

## 2019-04-12 NOTE — Progress Notes (Signed)
Patient brought to IR for possible thoracentesis.  Patient with suspected left pleural effusion.   Limited US Chest demonstrates no fluid in the left chest.  A trace amount of fluid is found on the right.   No procedure performed.  Images saved for MD review if needed.   Brynda Greathouse, MS RD PA-C 3:10 PM

## 2019-04-12 NOTE — Progress Notes (Signed)
PROGRESS NOTE    Nicholas Caldwell  CXK:[id number] DOB: 14-Nov-1945 DOA: 04/09/2019 PCP: [facility]    Brief Narrative:  73 y.o.malewith medical history significant ofatrial flutter, paroxysmal atrial fibrillation, CAD, chronic systolic congestive heart failure, COPDon 2-3 L home oxygen, type 2 diabetes, hypertension, hyperlipidemia presenting to the hospital for evaluation of altered mental status. History provided by EMS. Per report, he has been altered for the last 2 days. He was recently hospitalized and treated for a UTI. He has been off of his antibiotic for the last 2 days. Wife reported that patient refused to take his medications today and possibly yesterday as well. He is normally a little slow to answer questions.In the ED, initially on 3 L supplemental oxygen, subsequently required BiPAP for increased WOB.Noted to be wheezing.No leukocytosis. Lactic acid normal. Potassium >7.5.EKG without acute changes. Creatinine 1.3, improved since prior labs. UA and urine culture pending. Blood culture x2 pending. COVID-19 rapid test negative. Chest x-ray showing moderate to large left and small-to-moderate right pleural effusions with bibasilar areas of atelectasis and/or consolidation. Also showing cardiomegaly with probable pulmonary edema suggestive of CHF. Head CT negative for acute finding. Patient received albuterol, IV Lasix 40 mg, Solu-Medrol 125 mg, and cefepime in the ED. Nephrology consulted for severe hyperkalemia.   Previous hospitalization briefly reviewed, patient was hospitalized from 5/3 to 5/28.  At that time he was admitted with progressive dyspnea with elevated BNP greater than 1000, atrial fibrillation as well as onset of anemia.  Hospital course at that time was complicated by ischemic stroke, generalized weakness UTI, HCAP.  He had declined rehab placement and went home.   Assessment & Plan:   Principal Problem:   Acute respiratory failure (HCC)  Active Problems:   PAF (paroxysmal atrial fibrillation) (HCC)   Acute encephalopathy   Diabetes mellitus type 2 in obese (HCC)   Hyperkalemia  Acute on chronic hypoxic, hypercapnic respiratory failuresecondary to acute COPD exacerbation,acute exacerbation of chronic systolic CHF, and ?CAP -Chest x-ray showing moderate to large left and small-to-moderate right pleural effusions with bibasilar areas of atelectasis and/or consolidation. Also showing cardiomegaly with probable pulmonary edema suggestive of CHF -Patient uses 2-3 L supplemental oxygen at home -O2 weaned to [facility] currently. Mentation seems improved -Pending US guided thoracentesis  Acute on chronic systolic CHF  -BNP 9702. Pt had been continued on IV Lasix 40 mg twice daily. Monitor intake/output, daily weights, and fluid/sodium restriction.  At time of discharge at the end of May, patient was discharged home on torsemide 100 twice daily, potassium supplement, metolazone twice a week, BiDil, bisoprolol, spironolactone, Eliquis and Crestor.  -Appreciate input by Heart Failure Team. Per Heart Failure Team, OK to continue diuresis per Renal -Large mod-large B pleural effusions noted on imaging. Pending US guided thoracentesis   Acute COPD exacerbation -Wheezing as well at time of admission  -Patient is continued on Solu-Medrol,Duo nebs -Decreased BS on exam today   Severe hyperkalemia -Spironolactone, potassium supplementation listed as home medication, held during this admission -Unable to take p.o. Lokelma, as patient is on BiPAP.  Patient given calcium gluconate, insulin/D50, sodium bicarb, IV Lasix -Nephrology was consulted, since signed off -Today, pt hypokalemic, replaced  Acute metabolic encephalopathy -likely secondary to hypoxia/hypercapnia -Head CT negative for acute finding -Ammonia elevated at 67, no previous history of cirrhosis -resumed lactulose scheduled  CKD stage III -Baseline creatinine  creatinine at time of discharge on 5/28 was in the range of 1.9-2 -Cr today 1.44  Elevated alkaline phosphatase -Alkaline phosphatase  171, remainder of LFTs normal. GGT 111  Well-controlled type 2 diabetes -HA1c 6.8 on Mar 22, 2019 -continue with sliding scale insulin as tolerated   Paroxysmal atrial fibrillation -Holding bisoprolol and Eliquis while on BiPAP.   -IV heparin while awaiting thoracentesis -Plan to resume Eliquis when stable post-procedure  Medical Noncompliance -Known hx of gross medical and dietary noncompliance  -Of note, patient's wife continues to bring foods high in sugar and salt. Was addressed at previous admit. High likelihood of further decompensation given patient's own noncompliance -Patient will benefit from SNF, however has been refusing. Consideration for hospice  DVT prophylaxis: Heparin gtt Code Status: Full Family Communication: Pt in room, family not at bedside Disposition Plan: Uncertain at this time  Consultants:   Nephrology  Cardiology (Heart Failure Team)  Procedures:     Antimicrobials: Anti-infectives (From admission, onward)   Start     Dose/Rate Route Frequency Ordered Stop   04/10/19 0600  ceFEPIme (MAXIPIME) 2 g in sodium chloride 0.9 % 100 mL IVPB     2 g 200 mL/hr over 30 Minutes Intravenous Every 8 hours 04/10/19 0003     04/09/19 2130  ceFEPIme (MAXIPIME) 2 g in sodium chloride 0.9 % 100 mL IVPB     2 g 200 mL/hr over 30 Minutes Intravenous  Once 04/09/19 2124 04/09/19 2304      Subjective: Reports feeling better. Wants something to eat this AM  Objective: Vitals:   04/12/19 0344 04/12/19 0355 04/12/19 0500 04/12/19 0851  BP:  (!) 138/56    Pulse: 74 79    Resp: 20 (!) 28    Temp:  99 F (37.2 C)    TempSrc:  Axillary    SpO2: 98% 97%  93%  Weight:   114.2 kg   Height:        Intake/Output Summary (Last 24 hours) at 04/12/2019 1102 Last data filed at 04/12/2019 0537 Gross per 24 hour  Intake 1788.25 ml   Output 1100 ml  Net 688.25 ml   Filed Weights   04/10/19 2018 04/11/19 0300 04/12/19 0500  Weight: 122.4 kg 122.8 kg 114.2 kg    Examination: General exam: Awake, laying in bed, in nad Respiratory system: Mildly increased resp effort, decreased BS Cardiovascular system: regular rate, s1, s2 Gastrointestinal system: Soft, nondistended, positive BS Central nervous system: CN2-12 grossly intact, strength intact Extremities: Perfused, no clubbing Skin: Normal skin turgor, no notable skin lesions seen Psychiatry: Mood normal // no visual hallucinations   Data Reviewed: I have personally reviewed following labs and imaging studies  CBC: Recent Labs  Lab 04/09/19 1959 04/09/19 2017 04/12/19 0231  WBC 6.0  --  5.1  NEUTROABS 4.2  --   --   HGB 9.9* 11.6* 9.2*  HCT 37.3* 34.0* 30.1*  MCV 95.2  --  83.1  PLT 319  --  681   Basic Metabolic Panel: Recent Labs  Lab 04/09/19 1959 04/09/19 2017  04/10/19 0154 04/10/19 0652 04/10/19 1141 04/10/19 1636 04/11/19 1256 04/12/19 0231  NA 140 140  --  146* 144  --   --  146* 146*  K >7.5* 7.7*   < > 7.0*  6.9* 7.3* 6.3* 5.2* 3.2* 3.1*  CL 108 111  --  110 110  --   --  99 100  CO2 27  --   --  24 28  --   --  31 33*  GLUCOSE 110* 110*  --  101* 121*  --   --  170* 259*  BUN 35* 33*  --  31* 33*  --   --  37* 38*  CREATININE 1.36* 1.40*  --  1.41* 1.32*  --   --  1.50* 1.44*  CALCIUM 8.9  --   --  9.4 9.4  --   --  9.0 8.5*   < > = values in this interval not displayed.   GFR: Estimated Creatinine Clearance: 62.2 mL/min (A) (by C-G formula based on SCr of 1.44 mg/dL (H)). Liver Function Tests: Recent Labs  Lab 04/09/19 1959  AST 19  ALT 25  ALKPHOS 171*  BILITOT 0.2*  PROT 7.5  ALBUMIN 3.4*   No results for input(s): LIPASE, AMYLASE in the last 168 hours. Recent Labs  Lab 04/10/19 0154  AMMONIA 67*   Coagulation Profile: No results for input(s): INR, PROTIME in the last 168 hours. Cardiac Enzymes: Recent Labs   Lab 04/09/19 1959 04/10/19 1141 04/10/19 1636  TROPONINI <0.03 0.03* 0.03*   BNP (last 3 results) No results for input(s): PROBNP in the last 8760 hours. HbA1C: No results for input(s): HGBA1C in the last 72 hours. CBG: Recent Labs  Lab 04/11/19 0650 04/11/19 1032 04/11/19 1609 04/11/19 2108 04/12/19 0610  GLUCAP 234* 164* 156* 171* 215*   Lipid Profile: No results for input(s): CHOL, HDL, LDLCALC, TRIG, CHOLHDL, LDLDIRECT in the last 72 hours. Thyroid Function Tests: Recent Labs    04/10/19 0154  TSH 0.587   Anemia Panel: Recent Labs    04/10/19 0154  VITAMINB12 337   Sepsis Labs: Recent Labs  Lab 04/09/19 1959 04/09/19 2226 04/10/19 0154  PROCALCITON  --   --  0.16  LATICACIDVEN 0.5 0.5  --     Recent Results (from the past 240 hour(s))  Culture, blood (routine x 2)     Status: None (Preliminary result)   Collection Time: 04/09/19  7:59 PM  Result Value Ref Range Status   Specimen Description   Final    BLOOD RIGHT ARM Performed at [facility] [address] [place], [place] [address]    Special Requests   Final    BOTTLES DRAWN AEROBIC ONLY Blood Culture results may not be optimal due to an inadequate volume of blood received in culture bottles Performed at [facility] [address], [place], [place] [address]    Culture   Final    NO GROWTH 2 DAYS Performed at [facility] [address], [place], [place] [address]    Report Status PENDING  Incomplete  Culture, blood (routine x 2)     Status: None (Preliminary result)   Collection Time: 04/09/19  7:59 PM  Result Value Ref Range Status   Specimen Description   Final    BLOOD RIGHT ARM Performed at [facility] [address] [place], [place] [address]    Special Requests   Final    BOTTLES DRAWN AEROBIC AND ANAEROBIC Blood Culture results may not be optimal due to an excessive volume of blood received in culture bottles  Performed at [facility] [address], [place], [place] [address]    Culture   Final    NO GROWTH 2 DAYS Performed at [facility] [address], [place], [place] [address]    Report Status PENDING  Incomplete  SARS Coronavirus 2 (CEPHEID - Performed in [facility] hospital lab), Hosp Order     Status: None   Collection Time: 04/09/19  7:59 PM  Result Value Ref Range Status   SARS Coronavirus 2 NEGATIVE NEGATIVE Final    Comment: (NOTE) If result is NEGATIVE SARS-CoV-2 target nucleic acids are NOT DETECTED. The SARS-CoV-2 RNA is generally detectable in upper and lower  respiratory specimens during the acute phase of infection. The lowest  concentration of SARS-CoV-2 viral copies this assay can detect is 250  copies / mL. A negative result does not preclude SARS-CoV-2 infection  and should not be used as the sole basis for treatment or other  patient management decisions.  A negative result may occur with  improper specimen collection / handling, submission of specimen other  than nasopharyngeal swab, presence of viral mutation(s) within the  areas targeted by this assay, and inadequate number of viral copies  (<250 copies / mL). A negative result must be combined with clinical  observations, patient history, and epidemiological information. If result is POSITIVE SARS-CoV-2 target nucleic acids are DETECTED. The SARS-CoV-2 RNA is generally detectable in upper and lower  respiratory specimens dur ing the acute phase of infection.  Positive  results are indicative of active infection with SARS-CoV-2.  Clinical  correlation with patient history and other diagnostic information is  necessary to determine patient infection status.  Positive results do  not rule out bacterial infection or co-infection with other viruses. If result is PRESUMPTIVE POSTIVE SARS-CoV-2 nucleic acids MAY BE PRESENT.   A presumptive positive result was obtained on the submitted  specimen  and confirmed on repeat testing.  While 2019 novel coronavirus  (SARS-CoV-2) nucleic acids may be present in the submitted sample  additional confirmatory testing may be necessary for epidemiological  and / or clinical management purposes  to differentiate between  SARS-CoV-2 and other Sarbecovirus currently known to infect humans.  If clinically indicated additional testing with an alternate test  methodology [phone]) is advised. The SARS-CoV-2 RNA is generally  detectable in upper and lower respiratory sp ecimens during the acute  phase of infection. The expected result is Negative. Fact Sheet for Patients:  StrictlyIdeas.no Fact Sheet for Healthcare Providers: BankingDealers.co.za This test is not yet approved or cleared by the [place] FDA and has been authorized for detection and/or diagnosis of SARS-CoV-2 by FDA under an Emergency Use Authorization (EUA).  This EUA will remain in effect (meaning this test can be used) for the duration of the COVID-19 declaration under Section 564(b)(1) of the Act, 21 U.S.C. section 360bbb-3(b)(1), unless the authorization is terminated or revoked sooner. Performed at [facility], [place] [address], [place], [place] [address]   Urine culture     Status: None   Collection Time: 04/09/19 11:28 PM  Result Value Ref Range Status   Specimen Description   Final    URINE, RANDOM Performed at [facility] [address], [place], [place] [address]    Special Requests   Final    NONE Performed at [facility], [place] [address] [place], [place] [address]    Culture   Final    NO GROWTH Performed at [facility], [place] [address] [place], [place] [address]    Report Status 04/11/2019 FINAL  Final  MRSA PCR Screening     Status: None   Collection Time: 04/10/19  1:34 AM  Result Value Ref Range Status   MRSA by PCR NEGATIVE  NEGATIVE Final    Comment:        The GeneXpert MRSA Assay (FDA approved for NASAL specimens  only), is one component of a comprehensive MRSA colonization surveillance program. It is not intended to diagnose MRSA infection nor to guide or monitor treatment for MRSA infections. Performed at [facility], [place] [address], [place], [place] [address]      Radiology Studies: No results found.  Scheduled Meds: . chlorhexidine  15 mL Mouth Rinse BID  . insulin aspart  0-9 Units Subcutaneous TID WC  . ipratropium-albuterol  3 mL Nebulization Q6H  . isosorbide-hydrALAZINE  0.5 tablet Oral TID  . mouth rinse  15 mL Mouth Rinse q12n4p  . methylPREDNISolone (SOLU-MEDROL) injection  60 mg Intravenous Q8H  . potassium chloride  40 mEq Oral BID  . torsemide  100 mg Oral BID   Continuous Infusions: . sodium chloride 10 mL/hr at 04/12/19 0400  . ceFEPime (MAXIPIME) IV 2 g (04/12/19 0537)  . heparin 1,350 Units/hr (04/12/19 0422)     LOS: 3 days   Marylu Lund, MD [facility] Pager On Amion  If 7PM-7AM, please contact night-coverage 04/12/2019, 11:02 AM

## 2019-04-12 NOTE — Progress Notes (Addendum)
Patient via bed to IR for thoracentesis

## 2019-04-13 ENCOUNTER — Telehealth (HOSPITAL_COMMUNITY): Payer: Self-pay | Admitting: *Deleted

## 2019-04-13 DIAGNOSIS — I5043 Acute on chronic combined systolic (congestive) and diastolic (congestive) heart failure: Secondary | ICD-10-CM

## 2019-04-13 LAB — GLUCOSE, CAPILLARY
Glucose-Capillary: 209 mg/dL — ABNORMAL HIGH (ref 70–99)
Glucose-Capillary: 193 mg/dL — ABNORMAL HIGH (ref 70–99)
Glucose-Capillary: 228 mg/dL — ABNORMAL HIGH (ref 70–99)
Glucose-Capillary: 196 mg/dL — ABNORMAL HIGH (ref 70–99)

## 2019-04-13 LAB — CBC
HCT: 30.4 % — ABNORMAL LOW (ref 39.0–52.0)
Hemoglobin: 9.4 g/dL — ABNORMAL LOW (ref 13.0–17.0)
MCH: 25.5 pg — ABNORMAL LOW (ref 26.0–34.0)
MCHC: 30.9 g/dL (ref 30.0–36.0)
MCV: 82.6 fL (ref 80.0–100.0)
Platelets: 323 10*3/uL (ref 150–400)
RBC: 3.68 MIL/uL — ABNORMAL LOW (ref 4.22–5.81)
RDW: 21.8 % — ABNORMAL HIGH (ref 11.5–15.5)
WBC: 8.7 10*3/uL (ref 4.0–10.5)
nRBC: 0.3 % — ABNORMAL HIGH (ref 0.0–0.2)

## 2019-04-13 LAB — BASIC METABOLIC PANEL
Anion gap: 13 (ref 5–15)
BUN: 42 mg/dL — ABNORMAL HIGH (ref 8–23)
CO2: 35 mmol/L — ABNORMAL HIGH (ref 22–32)
Calcium: 8 mg/dL — ABNORMAL LOW (ref 8.9–10.3)
Chloride: 94 mmol/L — ABNORMAL LOW (ref 98–111)
Creatinine, Ser: 1.34 mg/dL — ABNORMAL HIGH (ref 0.61–1.24)
GFR calc Af Amer: >60 mL/min (ref >60)
GFR calc non Af Amer: 53 mL/min — ABNORMAL LOW (ref >60)
Glucose, Bld: 239 mg/dL — ABNORMAL HIGH (ref 70–99)
Potassium: 2.9 mmol/L — ABNORMAL LOW (ref 3.5–5.1)
Sodium: 142 mmol/L (ref 135–145)

## 2019-04-13 LAB — BLOOD GAS, ARTERIAL
Acid-Base Excess: 14.2 mmol/L — ABNORMAL HIGH (ref 0.0–2.0)
Bicarbonate: 38.5 mmol/L — ABNORMAL HIGH (ref 20.0–28.0)
Drawn by: 560031
O2 Content: 3 L/min
O2 Saturation: 94.5 %
Patient temperature: 98.6
pCO2 arterial: 50.2 mmHg — ABNORMAL HIGH (ref 32.0–48.0)
pH, Arterial: 7.498 — ABNORMAL HIGH (ref 7.350–7.450)
pO2, Arterial: 72.9 mmHg — ABNORMAL LOW (ref 83.0–108.0)

## 2019-04-13 LAB — MAGNESIUM: Magnesium: 1.3 mg/dL — ABNORMAL LOW (ref 1.7–2.4)

## 2019-04-13 LAB — APTT: aPTT: 78 seconds — ABNORMAL HIGH (ref 24–36)

## 2019-04-13 LAB — HEPARIN LEVEL (UNFRACTIONATED): Heparin Unfractionated: 0.67 IU/mL (ref 0.30–0.70)

## 2019-04-13 MED ORDER — CYANOCOBALAMIN 1000 MCG/ML IJ SOLN
1000.0000 ug | Freq: Every day | INTRAMUSCULAR | Status: AC
  Administered 2019-04-13 – 2019-04-14 (×2): 1000 ug via SUBCUTANEOUS
  Filled 2019-04-13 (×2): qty 1

## 2019-04-13 MED ORDER — IPRATROPIUM-ALBUTEROL 0.5-2.5 (3) MG/3ML IN SOLN
3.0000 mL | Freq: Three times a day (TID) | RESPIRATORY_TRACT | Status: DC
  Administered 2019-04-14 (×2): 3 mL via RESPIRATORY_TRACT
  Filled 2019-04-13 (×2): qty 3

## 2019-04-13 MED ORDER — POTASSIUM CHLORIDE CRYS ER 20 MEQ PO TBCR
40.0000 meq | EXTENDED_RELEASE_TABLET | Freq: Three times a day (TID) | ORAL | Status: AC
  Administered 2019-04-13 (×3): 40 meq via ORAL
  Filled 2019-04-13 (×3): qty 2

## 2019-04-13 MED ORDER — HEPARIN (PORCINE) 25000 UT/250ML-% IV SOLN: 1300.0000 [IU]/h | INTRAVENOUS | Status: DC

## 2019-04-13 MED ORDER — ROSUVASTATIN CALCIUM 20 MG PO TABS
20.0000 mg | ORAL_TABLET | Freq: Every day | ORAL | Status: DC
  Administered 2019-04-13: 20 mg via ORAL

## 2019-04-13 MED ORDER — APIXABAN 5 MG PO TABS
5.0000 mg | ORAL_TABLET | Freq: Two times a day (BID) | ORAL | Status: DC
  Administered 2019-04-13 – 2019-04-14 (×3): 5 mg via ORAL
  Filled 2019-04-13 (×3): qty 1

## 2019-04-13 MED ORDER — CALCIUM CARBONATE ANTACID 500 MG PO CHEW
1.0000 | CHEWABLE_TABLET | Freq: Three times a day (TID) | ORAL | Status: DC | PRN
  Administered 2019-04-13 – 2019-04-14 (×6): 200 mg via ORAL
  Filled 2019-04-13 (×6): qty 1

## 2019-04-13 MED ORDER — MAGNESIUM SULFATE 4 GM/100ML IV SOLN
4.0000 g | Freq: Once | INTRAVENOUS | Status: AC
  Administered 2019-04-13: 13:00:00 4 g via INTRAVENOUS
  Filled 2019-04-13: qty 100

## 2019-04-13 MED ORDER — METHYLPREDNISOLONE SODIUM SUCC 40 MG IJ SOLR
40.0000 mg | Freq: Two times a day (BID) | INTRAMUSCULAR | Status: DC
  Administered 2019-04-13 – 2019-04-14 (×2): 40 mg via INTRAVENOUS
  Filled 2019-04-13: qty 1

## 2019-04-13 NOTE — Care Management Important Message (Signed)
Important Message  Patient Details  Name: Nicholas Caldwell MRN: [id number] Date of Birth: 20-Nov-1945   Medicare Important Message Given:  Yes    [name] 04/13/2019, 12:17 PM

## 2019-04-13 NOTE — Progress Notes (Signed)
RT Note:  Nicholas Caldwell has refused the Bipap machine for tonight.  I asked what time he wanted to go on tonight and he replied that he did not think he would wear it tonight.  Will continue to monitor.

## 2019-04-13 NOTE — Progress Notes (Signed)
[facility] for Heparin  Indication: Bridge therapy while pt on BiPAP and apixaban for afib on hold, stroke on last admission  No Known Allergies  Patient Measurements: Height: 6' (182.9 cm) Weight: 259 lb 0.7 oz (117.5 kg) IBW/kg (Calculated) : 77.6 Heparin Dosing Weight: 103.3 kg  Vital Signs: Temp: 98.6 F (37 C) (06/10 0800) Temp Source: Oral (06/10 0800) BP: 151/69 (06/10 0800) Pulse Rate: 79 (06/10 0800)  Labs: Recent Labs    04/10/19 1141  04/10/19 1636 04/11/19 0212 04/11/19 1256 04/12/19 0231 04/13/19 0255  HGB  --   --   --   --   --  9.2* 9.4*  HCT  --   --   --   --   --  30.1* 30.4*  PLT  --   --   --   --   --  318 323  APTT  --    < >  --  86* 86* 87* 78*  HEPARINUNFRC  --    < >  --  0.96*  --  0.72* 0.67  CREATININE  --   --   --   --  1.50* 1.44* 1.34*  TROPONINI 0.03*  --  0.03*  --   --   --   --    < > = values in this interval not displayed.    Estimated Creatinine Clearance: 67.9 mL/min (A) (by C-G formula based on SCr of 1.34 mg/dL (H)).   Medical History: Past Medical History:  Diagnosis Date  . Atrial flutter ([place])    a. recurrent AFlutter with RVR;  b. Amiodarone Rx started 4/16  . CAD (coronary artery disease)    a. LHC 1/16:  mLAD diffuse disease, pLCx mild disease, dLCx with disease but too small for PCI, RCA ok, EF 25-30%  . Chronic pain   . Chronic systolic CHF (congestive heart failure) ([place])   . COPD (chronic obstructive pulmonary disease) ([place])   . Diabetes mellitus without complication ([place])   . Hypercholesteremia   . Hypertension   . NICM (nonischemic cardiomyopathy) ([place])    a.dx 2016. b. 2D echo 06/2016 - Last echo 07/01/16: mod dilated LV, mod LVH, EF 25-30%, mild-mod MR, sev LAE, mild-mod reduced RV systolic function, mild-mod TR, PASP 23mHG.  .Marland KitchenPAF (paroxysmal atrial fibrillation) (HCC)    On amio - ot a candidate for flecainide due to cardiomyopathy, not a candidate for Tikosyn due  to prolonged QT, and felt to be a poor candidate for ablation given left atrial size.  . Pulmonary hypertension ([place]   . Tobacco abuse    Assessment: 73yo M on apixaban 5 mg po bid PTA for Afib. Pharmacy consulted for heparin for bridge therapy while pt unable to take apixaban due to BiPAP.  Pt w/ stroke last admission. Stroke felt to be thromboembolic in the setting of afib and holding anticoag for GIB. HL artificially elevated given recent Eliquis use.  aPTT therapeutic at 78 sec on heparin 1300 units/hr. Hgb low but relatively stable. HL starting to correlate, 0.67. No signs/symptoms of bleeding or issues with infusion reported by nurse.  Goal of Therapy:  APTT 66 - 84 sec Heparin level 0.3 - 0.5 due to recent stroke Monitor platelets by anticoagulation protocol: Yes   Plan:  Transition to Eliquis this morning Discontinue heparin   EClaiborne Billings PharmD PGY2 Cardiology Pharmacy Resident Please check AMION for all Pharmacist numbers by unit 04/13/2019 8:18 AM

## 2019-04-13 NOTE — Telephone Encounter (Signed)
Pt called stating pt's records need to be faxed to the [facility] so that they will know what is going on with pt so they can approve his meds, home nursing and PT.  Dr Claris Gladden note from today along with past 2 d/c summary, echo, cath, cts, mri and labs all faxed to [facility] D. At [phone]

## 2019-04-13 NOTE — Progress Notes (Addendum)
PROGRESS NOTE    Nicholas Caldwell   UYQ:[id number]  DOB: [birth year]  DOA: 04/09/2019 PCP: [facility]   Brief Narrative:  Nicholas Caldwell 73 y.o.malewith medical history significant ofatrial flutter, paroxysmal atrial fibrillation, CAD, chronic systolic congestive heart failure, COPDon 2-3 L home oxygen, type 2 diabetes, hypertension, hyperlipidemia presenting to the hospital for evaluation of altered mental status.  He was recently hospitalized and treated for a UTI. He has been off of his antibiotic for the last 2 days. In the ED, initially on 3 L supplemental oxygen, subsequently required BiPAP for increased WOB.Noted to be wheezing.No leukocytosis. Lactic acid normal. Potassium >7.5. Chest x-ray showing moderate to large left and small-to-moderate right pleural effusions with bibasilar areas of atelectasis and/or consolidation. Also showing cardiomegaly with probable pulmonary edema suggestive of CHF.  Patient received albuterol, IV Lasix 40 mg, Solu-Medrol 125 mg, and cefepime in the ED. Previous hospitalization briefly reviewed, patient was hospitalized from 5/3 to 5/28. At that time he was admitted with progressive dyspnea with elevated BNP greater than 1000, atrial fibrillation as well as onset of anemia. Hospital course at that time was complicated by ischemic stroke, generalized weakness UTI, HCAP. He had declined rehab placement and went home.  Subjective: Wants to go home. He has no complaints. Remains slightly confused.     Assessment & Plan:   Principal Problem:   Acute on chronic respiratory failure Acute exacerbation of chronic systolic CHF Hypercapnicrespiratory failuresecondary to acute COPD exacerbation - he has been diuresed and is now on oral diuretics (demadex)_ - a thoracentesis was planned but when he went down to IR on 6/9, no effusions were noted - cont nebs- will wean Solumedrol today to Q 12 - no wheezing on exam - on cefepime for  possible pneumonia-  noted to have a cough on exam - on his baseline 3 L O2 currently - cont Bidil  Active Problems:     Acute encephalopathy - main reason for presenting to hospital - due to infection? - UA negative on admission but was on antibiotics as outpt- tomorrow will be day 5 of Cefepime and I will subsequently stop it - Head CT on 6/6 negative he had a low normal Vit B12 on 6/7 which was 337- will replace via s/c injection - ? Due to recent frontal CVA- MRI on 5/17 > > subcentimeter acute/early subacute infarctions within the left posterior frontal lobe subcortical white matter - ? Due to CO2 retention- he refuses BiPAP- will check ABG - check RPR   - he is on Lactulose and Valium PRN but has not been receiving them over the past few days-  will d/c these    PAF (paroxysmal atrial fibrillation)/ a flutter - cont Apixaban - not a good candidate for ablation per EP - not on Tikosyn or amiodarone due to long QT  Hypokalemia/ hypomagnesemia - replace-   CVA- a few wks ago  - cont DOAC and statin    Diabetes mellitus type 2 in obese   - cont SSI TIC- on 70/30 at home    Hyperkalemia - ? If he was taking his medications appropriately at home  Time spent in minutes:  > 35 min extensive time taken in reviewing notes- discussed plan with cardiology DVT prophylaxis: Apixaban Code Status: Full code Family Communication:  Disposition Plan: follow ongoing confusion, ABG and other labs- cardiology trying to set up [facility] via [place] Consultants:   cardiology Procedures:   none Antimicrobials:  Anti-infectives (From admission, onward)  Start     Dose/Rate Route Frequency Ordered Stop   04/10/19 0600  ceFEPIme (MAXIPIME) 2 g in sodium chloride 0.9 % 100 mL IVPB     2 g 200 mL/hr over 30 Minutes Intravenous Every 8 hours 04/10/19 0003     04/09/19 2130  ceFEPIme (MAXIPIME) 2 g in sodium chloride 0.9 % 100 mL IVPB     2 g 200 mL/hr over 30 Minutes Intravenous  Once 04/09/19 2124  04/09/19 2304       Objective: Vitals:   04/13/19 0204 04/13/19 0300 04/13/19 0721 04/13/19 0800  BP:  (!) 125/49  (!) 151/69  Pulse: 90 60  79  Resp: (!) 27 (!) 23  (!) 24  Temp:  98.7 F (37.1 C)  98.6 F (37 C)  TempSrc:  Oral  Oral  SpO2:  96% 99% 96%  Weight:  117.5 kg    Height:        Intake/Output Summary (Last 24 hours) at 04/13/2019 1146 Last data filed at 04/13/2019 1106 Gross per 24 hour  Intake 2855.15 ml  Output 2500 ml  Net 355.15 ml   Filed Weights   04/11/19 0300 04/12/19 0500 04/13/19 0300  Weight: 122.8 kg 114.2 kg 117.5 kg    Examination: General exam: Appears comfortable  HEENT: PERRLA, oral mucosa moist, no sclera icterus or thrush Respiratory system: Clear to auscultation. Respiratory effort normal. Cardiovascular system: S1 & S2 heard, RRR.   Gastrointestinal system: Abdomen soft, non-tender, nondistended. Normal bowel sounds. Central nervous system: Alert and oriented x 2. No focal neurological deficits. Extremities: No cyanosis, clubbing or edema Skin: No rashes or ulcers Psychiatry:  Mood & affect appropriate.     Data Reviewed: I have personally reviewed following labs and imaging studies  CBC: Recent Labs  Lab 04/09/19 1959 04/09/19 2017 04/12/19 0231 04/13/19 0255  WBC 6.0  --  5.1 8.7  NEUTROABS 4.2  --   --   --   HGB 9.9* 11.6* 9.2* 9.4*  HCT 37.3* 34.0* 30.1* 30.4*  MCV 95.2  --  83.1 82.6  PLT 319  --  318 469   Basic Metabolic Panel: Recent Labs  Lab 04/10/19 0154 04/10/19 0652 04/10/19 1141 04/10/19 1636 04/11/19 1256 04/12/19 0231 04/13/19 0255  NA 146* 144  --   --  146* 146* 142  K 7.0*  6.9* 7.3* 6.3* 5.2* 3.2* 3.1* 2.9*  CL 110 110  --   --  99 100 94*  CO2 24 28  --   --  31 33* 35*  GLUCOSE 101* 121*  --   --  170* 259* 239*  BUN 31* 33*  --   --  37* 38* 42*  CREATININE 1.41* 1.32*  --   --  1.50* 1.44* 1.34*  CALCIUM 9.4 9.4  --   --  9.0 8.5* 8.0*   GFR: Estimated Creatinine Clearance: 67.9  mL/min (A) (by C-G formula based on SCr of 1.34 mg/dL (H)). Liver Function Tests: Recent Labs  Lab 04/09/19 1959  AST 19  ALT 25  ALKPHOS 171*  BILITOT 0.2*  PROT 7.5  ALBUMIN 3.4*   No results for input(s): LIPASE, AMYLASE in the last 168 hours. Recent Labs  Lab 04/10/19 0154  AMMONIA 67*   Coagulation Profile: No results for input(s): INR, PROTIME in the last 168 hours. Cardiac Enzymes: Recent Labs  Lab 04/09/19 1959 04/10/19 1141 04/10/19 1636  TROPONINI <0.03 0.03* 0.03*   BNP (last 3 results) No results for input(s):  PROBNP in the last 8760 hours. HbA1C: No results for input(s): HGBA1C in the last 72 hours. CBG: Recent Labs  Lab 04/12/19 1132 04/12/19 1633 04/12/19 2156 04/13/19 0619 04/13/19 1141  GLUCAP 195* 227* 181* 209* 193*   Lipid Profile: No results for input(s): CHOL, HDL, LDLCALC, TRIG, CHOLHDL, LDLDIRECT in the last 72 hours. Thyroid Function Tests: No results for input(s): TSH, T4TOTAL, FREET4, T3FREE, THYROIDAB in the last 72 hours. Anemia Panel: No results for input(s): VITAMINB12, FOLATE, FERRITIN, TIBC, IRON, RETICCTPCT in the last 72 hours. Urine analysis:    Component Value Date/Time   COLORURINE STRAW (A) 04/09/2019 2328   APPEARANCEUR CLEAR 04/09/2019 2328   LABSPEC 1.009 04/09/2019 2328   PHURINE 5.0 04/09/2019 2328   GLUCOSEU NEGATIVE 04/09/2019 2328   HGBUR NEGATIVE 04/09/2019 2328   BILIRUBINUR NEGATIVE 04/09/2019 2328   KETONESUR NEGATIVE 04/09/2019 2328   PROTEINUR NEGATIVE 04/09/2019 2328   UROBILINOGEN 0.2 10/26/2014 2206   NITRITE NEGATIVE 04/09/2019 2328   LEUKOCYTESUR NEGATIVE 04/09/2019 2328   Sepsis Labs: _0 (procalcitonin:4,lacticidven:4) ) Recent Results (from the past 240 hour(s))  Culture, blood (routine x 2)     Status: None (Preliminary result)   Collection Time: 04/09/19  7:59 PM  Result Value Ref Range Status   Specimen Description   Final    BLOOD RIGHT ARM Performed at [facility], [place] [address], [place], [place] [address]    Special Requests   Final    BOTTLES DRAWN AEROBIC ONLY Blood Culture results may not be optimal due to an inadequate volume of blood received in culture bottles Performed at [facility] [address], [place], [place] [address]    Culture   Final    NO GROWTH 4 DAYS Performed at [facility], [place] [address], [place], [place] [address]    Report Status PENDING  Incomplete  Culture, blood (routine x 2)     Status: None (Preliminary result)   Collection Time: 04/09/19  7:59 PM  Result Value Ref Range Status   Specimen Description   Final    BLOOD RIGHT ARM Performed at [facility] [address], [place], [place] [address]    Special Requests   Final    BOTTLES DRAWN AEROBIC AND ANAEROBIC Blood Culture results may not be optimal due to an excessive volume of blood received in culture bottles Performed at [facility] [address], [place], [place] [address]    Culture   Final    NO GROWTH 4 DAYS Performed at [facility], [place] [address], [place], [place] [address]    Report Status PENDING  Incomplete  SARS Coronavirus 2 (CEPHEID - Performed in [facility] hospital lab), Hosp Order     Status: None   Collection Time: 04/09/19  7:59 PM  Result Value Ref Range Status   SARS Coronavirus 2 NEGATIVE NEGATIVE Final    Comment: (NOTE) If result is NEGATIVE SARS-CoV-2 target nucleic acids are NOT DETECTED. The SARS-CoV-2 RNA is generally detectable in upper and lower  respiratory specimens during the acute phase of infection. The lowest  concentration of SARS-CoV-2 viral copies this assay can detect is 250  copies / mL. A negative result does not preclude SARS-CoV-2 infection  and should not be used as the sole basis for treatment or other  patient management decisions.  A negative result may occur with  improper specimen collection / handling, submission  of specimen other  than nasopharyngeal  swab, presence of viral mutation(s) within the  areas targeted by this assay, and inadequate number of viral copies  (<250 copies / mL). A negative result must be combined with clinical  observations, patient history, and epidemiological information. If result is POSITIVE SARS-CoV-2 target nucleic acids are DETECTED. The SARS-CoV-2 RNA is generally detectable in upper and lower  respiratory specimens dur ing the acute phase of infection.  Positive  results are indicative of active infection with SARS-CoV-2.  Clinical  correlation with patient history and other diagnostic information is  necessary to determine patient infection status.  Positive results do  not rule out bacterial infection or co-infection with other viruses. If result is PRESUMPTIVE POSTIVE SARS-CoV-2 nucleic acids MAY BE PRESENT.   A presumptive positive result was obtained on the submitted specimen  and confirmed on repeat testing.  While 2019 novel coronavirus  (SARS-CoV-2) nucleic acids may be present in the submitted sample  additional confirmatory testing may be necessary for epidemiological  and / or clinical management purposes  to differentiate between  SARS-CoV-2 and other Sarbecovirus currently known to infect humans.  If clinically indicated additional testing with an alternate test  methodology [phone]) is advised. The SARS-CoV-2 RNA is generally  detectable in upper and lower respiratory sp ecimens during the acute  phase of infection. The expected result is Negative. Fact Sheet for Patients:  StrictlyIdeas.no Fact Sheet for Healthcare Providers: BankingDealers.co.za This test is not yet approved or cleared by the [place] FDA and has been authorized for detection and/or diagnosis of SARS-CoV-2 by FDA under an Emergency Use Authorization (EUA).  This EUA will remain in effect (meaning this test can be used) for  the duration of the COVID-19 declaration under Section 564(b)(1) of the Act, 21 U.S.C. section 360bbb-3(b)(1), unless the authorization is terminated or revoked sooner. Performed at [facility], [place] [address], [place], [place] [address]   Urine culture     Status: None   Collection Time: 04/09/19 11:28 PM  Result Value Ref Range Status   Specimen Description   Final    URINE, RANDOM Performed at [facility] [address] [place], [place] [address]    Special Requests   Final    NONE Performed at [facility], [place] [address], [place], [place] [address]    Culture   Final    NO GROWTH Performed at [facility], [place] [address], [place],  [address]    Report Status 04/11/2019 FINAL  Final  MRSA PCR Screening     Status: None   Collection Time: 04/10/19  1:34 AM  Result Value Ref Range Status   MRSA by PCR NEGATIVE NEGATIVE Final    Comment:        The GeneXpert MRSA Assay (FDA approved for NASAL specimens only), is one component of a comprehensive MRSA colonization surveillance program. It is not intended to diagnose MRSA infection nor to guide or monitor treatment for MRSA infections. Performed at [facility], [place] [address], [place],  [address]          Radiology Studies: Ir US Chest  Result Date: 04/12/2019 CLINICAL DATA:  Pleural effusions by chest x-ray assess for thoracentesis EXAM: ULTRASOUND OF CHEST SOFT TISSUES TECHNIQUE: Ultrasound examination of the chest wall soft tissues was performed in the area of clinical concern. COMPARISON:  04/09/2019 FINDINGS: Ultrasound performed of the posterior chest bilaterally. Trace pleural effusions bilaterally. There is not enough to warrant thoracentesis. Procedure  not performed. IMPRESSION: Trace bilateral pleural effusions.  Thoracentesis not performed. Electronically Signed   By: Jerilynn Mages.  Shick M.D.   On: 04/12/2019 15:37       Scheduled Meds: . apixaban  5 mg Oral BID  . chlorhexidine  15 mL Mouth Rinse BID  . ferrous sulfate  325 mg Oral BID WC  . insulin aspart  0-9 Units Subcutaneous TID WC  . ipratropium-albuterol  3 mL Nebulization Q6H  . isosorbide-hydrALAZINE  0.5 tablet Oral TID  . mouth rinse  15 mL Mouth Rinse q12n4p  . methylPREDNISolone (SOLU-MEDROL) injection  40 mg Intravenous Q12H  . potassium chloride  40 mEq Oral TID  . rosuvastatin  20 mg Oral q1800  . torsemide  100 mg Oral BID   Continuous Infusions: . sodium chloride 10 mL/hr at 04/12/19 0400  . ceFEPime (MAXIPIME) IV 2 g (04/13/19 1239)     LOS: 4 days      Debbe Odea, MD [facility] Pager: [email or web address] Password [email or web address] 04/13/2019, 11:46 AM

## 2019-04-13 NOTE — TOC Initial Note (Addendum)
Transition of Care [facility]) - Initial/Assessment Note    Patient Details  Name: Nicholas Caldwell MRN: [id number] Date of Birth: 09/14/1946  Transition of [facility] CM/SW Contact:    [name], RN Phone Number: 04/13/2019, 4:04 PM  Clinical Narrative:        Pt is a readmit with HF.  Pt is already active with [facility] for [facility] and PT - agency has agreed to temporarily provide HHRN 3 times a week as requested by HF team however order will need to specify (CM informed HF group).  Pt informed CM that he want to remain with [facility] for [facility] via medicare - pt states he is not interested in [place] coverage for [facility].  Pt also confirms that he will again pay out of pocket for discharge meds however he would like for discharge prescriptions to also be faxed to [facility] so refills can be provided at [facility].  [facility] informed [facility].   Pt is on home oxygen and confirms he has a portable tank for transport home  [facility] can be reached at [place] informed [place] pharmacist Jessie at [phone].  Prescriptions can be faxed to [facility] at [phone].  Pts CSW is [place] [phone].   Discharge summary needs to be faxed to [name] with [facility] [phone]          Expected Discharge Plan: [place]     Patient Goals and CMS Choice        Expected Discharge Plan and Services Expected Discharge Plan: [place]       Living arrangements for the past 2 months: Single Family Home Expected Discharge Date: 04/14/19                                    Prior Living Arrangements/Services Living arrangements for the past 2 months: Single Family Home Lives with:: Spouse Patient language and need for interpreter reviewed:: Yes Do you feel safe going back to the place where you live?: Yes      Need for Family Participation in Patient Care: Yes (Comment) Care giver support system in place?: Yes (comment) Current home  services: Home PT, Home RN([facility]) Criminal Activity/Legal Involvement Pertinent to Current Situation/Hospitalization: No - Comment as needed  Activities of Daily Living Home Assistive Devices/Equipment: None ADL Screening (condition at time of admission) Patient's cognitive ability adequate to safely complete daily activities?: No Is the patient deaf or have difficulty hearing?: No Does the patient have difficulty seeing, even when wearing glasses/contacts?: No Does the patient have difficulty concentrating, remembering, or making decisions?: Yes Patient able to express need for assistance with ADLs?: Yes Does the patient have difficulty dressing or bathing?: No Independently performs ADLs?: No Communication: Independent Dressing (OT): Needs assistance Is this a change from baseline?: Change from baseline, expected to last >3 days Grooming: Needs assistance Is this a change from baseline?: Change from baseline, expected to last >3 days Feeding: Independent Bathing: Needs assistance Is this a change from baseline?: Change from baseline, expected to last >3 days Toileting: Needs assistance Is this a change from baseline?: Change from baseline, expected to last >3days In/Out Bed: Needs assistance Is this a change from baseline?: Change from baseline, expected to last >3 days Walks in Home: Independent Does the patient have difficulty walking or climbing stairs?: Yes Weakness of Legs: Both Weakness  of Arms/Hands: None  Permission Sought/Granted                  Emotional Assessment   Attitude/Demeanor/Rapport: Engaged, Self-Confident Affect (typically observed): Accepting Orientation: : Oriented to Self, Oriented to Place, Oriented to  Time, Oriented to Situation   Psych Involvement: No (comment)  Admission diagnosis:  Hyperkalemia [E87.5] Altered mental status, unspecified altered mental status type [R41.82] Acute on chronic respiratory failure with hypoxia and  hypercapnia (HCC) [I62.70, J96.22] Patient Active Problem List   Diagnosis Date Noted  . Hyperkalemia 04/10/2019  . Cerebral embolism with cerebral infarction 03/21/2019  . Gastritis and gastroduodenitis   . NSVT (nonsustained ventricular tachycardia) ([place]) 03/08/2019  . Tobacco abuse counseling 03/08/2019  . Iron deficiency anemia   . Acute on chronic systolic CHF (congestive heart failure) ([place]) 03/06/2019  . Diabetes mellitus type 2 in obese ([place])   . Primary osteoarthritis of right hip   . Hypomagnesemia   . Steroid-induced hyperglycemia   . Supplemental oxygen dependent   . Leukocytosis   . Hypokalemia   . Acute on chronic anemia   . Diabetic peripheral neuropathy ([place])   . Acute on chronic systolic (congestive) heart failure ([place])   . Acute on chronic respiratory failure ([place]) 01/14/2019  . Hypoxia   . Altered mental status   . Heart failure ([place]) 12/30/2018  . AKI (acute kidney injury) ([place])   . Acute respiratory failure ([place]) 11/22/2018  . Acute on chronic respiratory failure with hypoxia and hypercapnia ([place]) 11/13/2018  . Metabolic encephalopathy 35/00/9381  . Acute respiratory failure with hypoxia and hypercapnia ([place]) 07/26/2018  . Acute exacerbation of CHF (congestive heart failure) ([place]) 07/26/2018  . CHF exacerbation ([place]) 05/01/2018  . CHF (congestive heart failure) ([place]) 04/30/2018  . Chronic respiratory failure with hypoxia ([place]) 12/28/2017  . Acute encephalopathy   . Atrial fibrillation with RVR ([place])   . SVT (supraventricular tachycardia) ([place])   . COPD GOLD 0   . Medically noncompliant   . Panlobular emphysema ([place])   . OSA (obstructive sleep apnea)   . Pulmonary hypertension ([place])   . Disorientation   . Pressure injury of skin 09/07/2016  . Acute on chronic combined systolic and diastolic CHF (congestive heart failure) ([place]) 09/05/2016  . Skin lesion-left heal 09/05/2016  . Chronic systolic CHF (congestive heart failure) ([place]) 07/16/2016  . COPD  (chronic obstructive pulmonary disease) ([place]) 07/16/2016  . Uncontrolled type 2 diabetes mellitus with complication ([place])   . Diabetic polyneuropathy associated with diabetes mellitus due to underlying condition ([place])   . Normocytic anemia 06/29/2016  . CAD - Non-obstructive by LHC1/16 01/21/2016  . Nonischemic cardiomyopathy ([place]) 10/09/2015  . PAF (paroxysmal atrial fibrillation) ([place])   . Chronic pain 02/19/2015  . Morbid obesity ([place]) 02/13/2015  . Dyspnea   . Elevated troponin I level 11/01/2014  . COPD exacerbation ([place]) 11/01/2014  . Essential hypertension 10/31/2014  . Type 2 diabetes mellitus with neuropathy 10/31/2014  . Hyperlipidemia  10/31/2014  . Cigarette smoker 10/31/2014   PCP:  Clinic, [place]:   [place], [place] [id number] [place] [address] Phone: [phone] Fax: [phone]     Social Determinants of Health (SDOH) Interventions    Readmission Risk Interventions Readmission Risk Prevention Plan 03/31/2019  Transportation Screening Complete  Medication Review (RN Care Manager) Complete  PCP or Specialist appointment within 3-5 days of discharge Not Complete  PCP/Specialist Appt Not Complete comments  earliest appt with HF clinic is 6/5 at 10am, CM requested [facility] PCP appt as well  [place] or [place] Complete  SW Recovery Care/Counseling Consult Complete  Palliative Care Screening Not [place] Patient Refused  Some recent data might be hidden

## 2019-04-13 NOTE — Progress Notes (Signed)
Pharmacy Antibiotic Note  Nicholas Caldwell is a 73 y.o. male admitted on 04/09/2019 with pneumonia.  Pharmacy has been consulted for Cefepime dosing. Patient is afebrile, WBC normal.   Plan: Cefepime 2gm IV q8hr, plan for 5 days of therapy Monitor cultures, renal function and clinical status  Height: 6' (182.9 cm) Weight: 259 lb 0.7 oz (117.5 kg) IBW/kg (Calculated) : 77.6  Temp (24hrs), Avg:98.3 F (36.8 C), Min:97.8 F (36.6 C), Max:98.7 F (37.1 C)  Recent Labs  Lab 04/09/19 1959  04/09/19 2226 04/10/19 0154 04/10/19 0652 04/11/19 1256 04/12/19 0231 04/13/19 0255  WBC 6.0  --   --   --   --   --  5.1 8.7  CREATININE 1.36*   < >  --  1.41* 1.32* 1.50* 1.44* 1.34*  LATICACIDVEN 0.5  --  0.5  --   --   --   --   --    < > = values in this interval not displayed.    Estimated Creatinine Clearance: 67.9 mL/min (A) (by C-G formula based on SCr of 1.34 mg/dL (H)).    No Known Allergies  Antimicrobials this admission: Cefepime 04/09/19 >>   Dose adjustments this admission:   Microbiology results: 6/6 BCx: NGx3 days 6/6 UCx: NG  Thank you for allowing pharmacy to be a part of this patient's care.   Claiborne Billings, PharmD PGY2 Cardiology Pharmacy Resident Please check AMION for all Pharmacist numbers by unit 04/13/2019 8:26 AM

## 2019-04-13 NOTE — Progress Notes (Signed)
Patient took off the BIPAP mask and refuses to wear it overnight stating that his mouth gets dry. I explained the importance of wearing the mask while he sleep and he did not want to wear the mask. Will continue to monitor the patient.

## 2019-04-13 NOTE — Progress Notes (Signed)
Patient informed MD wants him to ambulate to see how his sats do.  Patient refused

## 2019-04-13 NOTE — Progress Notes (Signed)
Inpatient Diabetes Program Recommendations  AACE/ADA: New Consensus Statement on Inpatient Glycemic Control (2015)  Target Ranges:  Prepandial:   less than 140 mg/dL      Peak postprandial:   less than 180 mg/dL (1-2 hours)      Critically ill patients:  140 - 180 mg/dL   Lab Results  Component Value Date   GLUCAP 193 (H) 04/13/2019   HGBA1C 6.8 (H) 03/22/2019    Review of Glycemic Control Results for Nicholas Caldwell, Nicholas Caldwell (MRN [id number]) as of 04/13/2019 14:29  Ref. Range 04/12/2019 11:32 04/12/2019 16:33 04/12/2019 21:56 04/13/2019 06:19 04/13/2019 11:41  Glucose-Capillary Latest Ref Range: 70 - 99 mg/dL 195 (H) 227 (H) 181 (H) 209 (H) 193 (H)   Diabetes history: DM 2 Outpatient Diabetes medications:  Novolog 70/30 10 units bid, Novolog 4 units tid with meals Current orders for Inpatient glycemic control:  Novolog sensitive tid with meals  Inpatient Diabetes Program Recommendations:   Consider adding Lantus 12 units daily while in the hospital.   Thanks,  Adah Perl, RN, BC-ADM Inpatient Diabetes Coordinator Pager [phone] (8a-5p)

## 2019-04-13 NOTE — Progress Notes (Signed)
Patient ID: Nicholas Caldwell, male   DOB: 1946-09-29, 73 y.o.   MRN: [id number]     Advanced Heart Failure Rounding Note  PCP-Cardiologist: No primary care provider on file.   Subjective:    He is awake/alert this morning, mild confusion.  Oriented to place.  He went down to IR yesterday but had minimal pleural effusions so no thoracentesis.  He is now back on apixaban.  He refused CPAP last night.  He is back on po torsemide.  K is now low, creatinine at 1.34.   He had atrial fibrillation overnight, currently NSR with PACs.   Afebrile, remains on cefepime.    Objective:   Weight Range: 117.5 kg Body mass index is 35.13 kg/m.   Vital Signs:   Temp:  [97.8 F (36.6 C)-98.7 F (37.1 C)] 98.6 F (37 C) (06/10 0800) Pulse Rate:  [57-90] 79 (06/10 0800) Resp:  [17-30] 24 (06/10 0800) BP: (95-151)/(45-69) 151/69 (06/10 0800) SpO2:  [92 %-100 %] 96 % (06/10 0800) FiO2 (%):  [40 %] 40 % (06/09 2318) Weight:  [117.5 kg] 117.5 kg (06/10 0300) Last BM Date: 04/13/19  Weight change: Filed Weights   04/11/19 0300 04/12/19 0500 04/13/19 0300  Weight: 122.8 kg 114.2 kg 117.5 kg    Intake/Output:   Intake/Output Summary (Last 24 hours) at 04/13/2019 0943 Last data filed at 04/13/2019 0920 Gross per 24 hour  Intake 2855.15 ml  Output 1900 ml  Net 955.15 ml      Physical Exam    General:  Well appearing. No resp difficulty HEENT: Normal Neck: Supple. JVP 8-9 cm. Carotids 2+ bilat; no bruits. No lymphadenopathy or thyromegaly appreciated. Cor: PMI nondisplaced. Irregular rate & rhythm. No rubs, gallops or murmurs. Lungs: Decreased BS at bases.  Abdomen: Soft, nontender, nondistended. No hepatosplenomegaly. No bruits or masses. Good bowel sounds. Extremities: No cyanosis, clubbing, rash, edema Neuro: Alert & orientedx3, cranial nerves grossly intact. moves all 4 extremities w/o difficulty. Affect pleasant   Telemetry   NSR with PACs and PVCs.  Atrial fibrillation last night.   Personally reviewed.   Labs    CBC Recent Labs    04/12/19 0231 04/13/19 0255  WBC 5.1 8.7  HGB 9.2* 9.4*  HCT 30.1* 30.4*  MCV 83.1 82.6  PLT 318 798   Basic Metabolic Panel Recent Labs    04/12/19 0231 04/13/19 0255  NA 146* 142  K 3.1* 2.9*  CL 100 94*  CO2 33* 35*  GLUCOSE 259* 239*  BUN 38* 42*  CREATININE 1.44* 1.34*  CALCIUM 8.5* 8.0*   Liver Function Tests No results for input(s): AST, ALT, ALKPHOS, BILITOT, PROT, ALBUMIN in the last 72 hours. No results for input(s): LIPASE, AMYLASE in the last 72 hours. Cardiac Enzymes Recent Labs    04/10/19 1141 04/10/19 1636  TROPONINI 0.03* 0.03*    BNP: BNP (last 3 results) Recent Labs    12/30/18 1621 03/06/19 1829 04/09/19 1959  BNP 1,273.0* 1,020.1* 1,843.4*    ProBNP (last 3 results) No results for input(s): PROBNP in the last 8760 hours.   D-Dimer No results for input(s): DDIMER in the last 72 hours. Hemoglobin A1C No results for input(s): HGBA1C in the last 72 hours. Fasting Lipid Panel No results for input(s): CHOL, HDL, LDLCALC, TRIG, CHOLHDL, LDLDIRECT in the last 72 hours. Thyroid Function Tests No results for input(s): TSH, T4TOTAL, T3FREE, THYROIDAB in the last 72 hours.  Invalid input(s): FREET3  Other results:   Imaging    Ir US  Chest  Result Date: 04/12/2019 CLINICAL DATA:  Pleural effusions by chest x-ray assess for thoracentesis EXAM: ULTRASOUND OF CHEST SOFT TISSUES TECHNIQUE: Ultrasound examination of the chest wall soft tissues was performed in the area of clinical concern. COMPARISON:  04/09/2019 FINDINGS: Ultrasound performed of the posterior chest bilaterally. Trace pleural effusions bilaterally. There is not enough to warrant thoracentesis. Procedure not performed. IMPRESSION: Trace bilateral pleural effusions.  Thoracentesis not performed. Electronically Signed   By: Jerilynn Mages.  Shick M.D.   On: 04/12/2019 15:37      Medications:     Scheduled Medications: . apixaban  5  mg Oral BID  . chlorhexidine  15 mL Mouth Rinse BID  . ferrous sulfate  325 mg Oral BID WC  . insulin aspart  0-9 Units Subcutaneous TID WC  . ipratropium-albuterol  3 mL Nebulization Q6H  . isosorbide-hydrALAZINE  0.5 tablet Oral TID  . mouth rinse  15 mL Mouth Rinse q12n4p  . methylPREDNISolone (SOLU-MEDROL) injection  40 mg Intravenous Q12H  . potassium chloride  40 mEq Oral TID  . rosuvastatin  20 mg Oral q1800  . torsemide  100 mg Oral BID     Infusions: . sodium chloride 10 mL/hr at 04/12/19 0400  . ceFEPime (MAXIPIME) IV 2 g (04/13/19 0619)     PRN Medications:  sodium chloride, acetaminophen **OR** acetaminophen, albuterol, diazepam, hydrALAZINE, lactulose, morphine injection   Assessment/Plan   1. Acute on chronic systolic CHF: Nonischemic cardiomyopathy.With prominent RV failure, likely related to COPD/OSA/OHS. TEE in 3/20 with EF 40-45%, severely dilated RV with moderately decreased systolic function. He has had multiple recent admissions with CHF and COPD exacerbations.He has not cooperated with paramedicine or [facility] home health as outpatient, has not been using CPAP per his wife, suspect significant medication noncompliance as well though he denies. During last admission unable to get CVP < 15 despite very aggressive diuretic regimen and inotropic support. I would NOT re-attempt inotropic support as it did not help much and he is not candidate for home inotropes.  On exam today, volume status looks around his baseline (probably mild volume overload but as noted, with RV failure will not get CVP to normal range).   - Continue torsemide 100 mg bid. Will give KCl 40 tid today and follow K.  Of note, he was admitted with hyperkalemia but is now hypokalemic.  - Restart bisoprolol 2.5 daily.  - Continue Bidil 0.5 tab tid  - He will stay off spironolactone given admission with hyperkalemia.    - I think his long-term prognosis, unfortunately, will be poor with significant  RV dysfunction and history of poor compliance.  2. Anemia: Hgb stable at 9.4.  Last admission had GI workup, colonoscopy 5/8 unremarkable but EGD showed necrotic mucosa gastric fundus/body that is concerning for gastric cancer, biopsies taken which were negative for malignancy.  3. Atrial fibrillation/flutter: Patient has history of paroxysmal atrial fibrillation. He had atrial fibrillation with RVR last night, now back in NSR with PACs. Was seen in past by Dr. Rayann Heman, not thought to be good ablation candidate. He has not been on Tikosyn or amiodarone due to history of long QT.   - Continue Eliquis.  4. COPD: Severe, on 3L home oxygen.  Possible component of COPD exacerbation, currently on Solumedrol.   - Per primary team  5. OHS/OSA: On home oxygen. Per wife, has not been using CPAP at home.  He refused CPAP last night.  6. ID: Suspect this is primarily a CHF exacerbation. Afebrile and  WBCs not significantly elevated.COVID-19 negative.  - He is empirically on cefepime.  7. CAD: Nonobstructive on prior cath. No s/s ischemia. He is on Crestor. Off ASA with DOAC and GI bleed  8. Chronic kidney disease stage III: Baseline seems to be 1.3-1.6.  Creatinine 1.34 today, stable.   9. DM2 with poor control - TRH managing  10. CVA: At last admission in 5/20, MRI head showed both small acute/subacute and old CVAs.  Neuro has seen, suspect related to atrial fibrillation.  - He is on Eliquis.  11. Hypokalemia: supplement.   Very poor compliance with medications and followup (missed his post-hospital followup).  Not using CPAP at home.  Has been very hard to get home health or paramedicine in to see him.  I think he would be best served in SNF but he refuses. Would like him to get nurse visit 3 x/week and PT 3x/week through [place] at home.   Length of Stay: [place], MD  04/13/2019, 9:43 AM  Advanced Heart Failure Team Pager [phone] (M-F; 7a - 4p)  Please contact [facility] for  night-coverage after hours (4p -7a ) and weekends on [email or web address]

## 2019-04-14 ENCOUNTER — Inpatient Hospital Stay (HOSPITAL_COMMUNITY): Payer: Non-veteran care

## 2019-04-14 DIAGNOSIS — E875 Hyperkalemia: Secondary | ICD-10-CM

## 2019-04-14 DIAGNOSIS — J441 Chronic obstructive pulmonary disease with (acute) exacerbation: Secondary | ICD-10-CM

## 2019-04-14 DIAGNOSIS — G9340 Encephalopathy, unspecified: Secondary | ICD-10-CM

## 2019-04-14 DIAGNOSIS — J181 Lobar pneumonia, unspecified organism: Secondary | ICD-10-CM

## 2019-04-14 DIAGNOSIS — I10 Essential (primary) hypertension: Secondary | ICD-10-CM

## 2019-04-14 DIAGNOSIS — G9341 Metabolic encephalopathy: Secondary | ICD-10-CM

## 2019-04-14 DIAGNOSIS — Z9119 Patient's noncompliance with other medical treatment and regimen: Secondary | ICD-10-CM

## 2019-04-14 LAB — CULTURE, BLOOD (ROUTINE X 2)
Culture: NO GROWTH
Culture: NO GROWTH

## 2019-04-14 LAB — BASIC METABOLIC PANEL
Anion gap: 13 (ref 5–15)
BUN: 53 mg/dL — ABNORMAL HIGH (ref 8–23)
CO2: 35 mmol/L — ABNORMAL HIGH (ref 22–32)
Calcium: 8 mg/dL — ABNORMAL LOW (ref 8.9–10.3)
Chloride: 89 mmol/L — ABNORMAL LOW (ref 98–111)
Creatinine, Ser: 1.59 mg/dL — ABNORMAL HIGH (ref 0.61–1.24)
GFR calc Af Amer: 50 mL/min — ABNORMAL LOW (ref >60)
GFR calc non Af Amer: 43 mL/min — ABNORMAL LOW (ref >60)
Glucose, Bld: 194 mg/dL — ABNORMAL HIGH (ref 70–99)
Potassium: 3.1 mmol/L — ABNORMAL LOW (ref 3.5–5.1)
Sodium: 137 mmol/L (ref 135–145)

## 2019-04-14 LAB — CBC
HCT: 30.5 % — ABNORMAL LOW (ref 39.0–52.0)
Hemoglobin: 9.4 g/dL — ABNORMAL LOW (ref 13.0–17.0)
MCH: 25.3 pg — ABNORMAL LOW (ref 26.0–34.0)
MCHC: 30.8 g/dL (ref 30.0–36.0)
MCV: 82 fL (ref 80.0–100.0)
Platelets: 329 10*3/uL (ref 150–400)
RBC: 3.72 MIL/uL — ABNORMAL LOW (ref 4.22–5.81)
RDW: 21.6 % — ABNORMAL HIGH (ref 11.5–15.5)
WBC: 11.3 10*3/uL — ABNORMAL HIGH (ref 4.0–10.5)
nRBC: 0.5 % — ABNORMAL HIGH (ref 0.0–0.2)

## 2019-04-14 LAB — GLUCOSE, CAPILLARY
Glucose-Capillary: 171 mg/dL — ABNORMAL HIGH (ref 70–99)
Glucose-Capillary: 277 mg/dL — ABNORMAL HIGH (ref 70–99)

## 2019-04-14 LAB — RPR: RPR Ser Ql: NONREACTIVE

## 2019-04-14 LAB — MAGNESIUM: Magnesium: 1.7 mg/dL (ref 1.7–2.4)

## 2019-04-14 MED ORDER — PREDNISONE 10 MG PO TABS: 60.0000 mg | ORAL_TABLET | Freq: Every day | ORAL | 0 refills | Status: DC

## 2019-04-14 MED ORDER — POTASSIUM CHLORIDE CRYS ER 20 MEQ PO TBCR
40.0000 meq | EXTENDED_RELEASE_TABLET | Freq: Three times a day (TID) | ORAL | Status: DC
  Administered 2019-04-14 (×2): 40 meq via ORAL
  Filled 2019-04-14 (×2): qty 2

## 2019-04-14 MED ORDER — POTASSIUM CHLORIDE CRYS ER 20 MEQ PO TBCR
40.0000 meq | EXTENDED_RELEASE_TABLET | Freq: Once | ORAL | Status: AC
  Administered 2019-04-14: 11:00:00 40 meq via ORAL
  Filled 2019-04-14: qty 2

## 2019-04-14 MED ORDER — CEFUROXIME AXETIL 500 MG PO TABS: 500.0000 mg | ORAL_TABLET | Freq: Two times a day (BID) | ORAL | 0 refills | Status: DC

## 2019-04-14 MED ORDER — BISOPROLOL FUMARATE 5 MG PO TABS
2.5000 mg | ORAL_TABLET | Freq: Every day | ORAL | Status: DC
  Administered 2019-04-14: 10:00:00 2.5 mg via ORAL
  Filled 2019-04-14: qty 1

## 2019-04-14 MED ORDER — TORSEMIDE 20 MG PO TABS: 80.0000 mg | ORAL_TABLET | Freq: Two times a day (BID) | ORAL | 0 refills | Status: DC

## 2019-04-14 MED ORDER — BISOPROLOL FUMARATE 5 MG PO TABS: 2.5000 mg | ORAL_TABLET | Freq: Every day | ORAL | 0 refills | Status: DC

## 2019-04-14 MED ORDER — MAGNESIUM SULFATE 2 GM/50ML IV SOLN
2.0000 g | Freq: Once | INTRAVENOUS | Status: AC
  Administered 2019-04-14: 09:00:00 2 g via INTRAVENOUS
  Filled 2019-04-14: qty 50

## 2019-04-14 MED ORDER — ISOSORB DINITRATE-HYDRALAZINE 20-37.5 MG PO TABS: 0.5000 | ORAL_TABLET | Freq: Three times a day (TID) | ORAL | 0 refills | Status: DC

## 2019-04-14 MED ORDER — TORSEMIDE 20 MG PO TABS: 80.0000 mg | ORAL_TABLET | Freq: Two times a day (BID) | ORAL | Status: DC

## 2019-04-14 NOTE — Progress Notes (Signed)
Patient ID: Nicholas Caldwell, male   DOB: Apr 24, 1946, 73 y.o.   MRN: [id number]     Advanced Heart Failure Rounding Note  PCP-Cardiologist: No primary care provider on file.   Subjective:    He is awake/alert this morning, does not seem confused.  He refused CPAP again last night.  He is back on po torsemide, good UOP yesterday.  K is now low, creatinine mildly higher at 1.59.    Currently NSR with PACs.   Afebrile, remains on cefepime.    Objective:   Weight Range: 114.3 kg Body mass index is 34.18 kg/m.   Vital Signs:   Temp:  [98 F (36.7 C)-98.9 F (37.2 C)] 98.9 F (37.2 C) (06/11 0758) Pulse Rate:  [51-117] 77 (06/11 0720) Resp:  [21-29] 21 (06/11 0720) BP: (104-126)/(51-67) 105/54 (06/11 0758) SpO2:  [94 %-100 %] 94 % (06/11 0720) Weight:  [114.3 kg] 114.3 kg (06/11 0341) Last BM Date: 04/13/19  Weight change: Filed Weights   04/12/19 0500 04/13/19 0300 04/14/19 0341  Weight: 114.2 kg 117.5 kg 114.3 kg    Intake/Output:   Intake/Output Summary (Last 24 hours) at 04/14/2019 1040 Last data filed at 04/14/2019 0804 Gross per 24 hour  Intake 2280 ml  Output 3025 ml  Net -745 ml      Physical Exam    General: NAD Neck: JVP 8 cm, no thyromegaly or thyroid nodule.  Lungs: Clear to auscultation bilaterally with normal respiratory effort. CV: Nondisplaced PMI.  Heart irregular S1/S2 (PACs), no S3/S4, no murmur.  No peripheral edema.  Abdomen: Soft, nontender, no hepatosplenomegaly, no distention.  Skin: Intact without lesions or rashes.  Neurologic: Alert and oriented x 3.  Psych: Normal affect. Extremities: No clubbing or cyanosis.  HEENT: Normal.    Telemetry   NSR with PACs.  Personally reviewed.   Labs    CBC Recent Labs    04/13/19 0255 04/14/19 0226  WBC 8.7 11.3*  HGB 9.4* 9.4*  HCT 30.4* 30.5*  MCV 82.6 82.0  PLT 323 829   Basic Metabolic Panel Recent Labs    04/13/19 0255 04/14/19 0226  NA 142 137  K 2.9* 3.1*  CL 94* 89*  CO2  35* 35*  GLUCOSE 239* 194*  BUN 42* 53*  CREATININE 1.34* 1.59*  CALCIUM 8.0* 8.0*  MG 1.3* 1.7   Liver Function Tests No results for input(s): AST, ALT, ALKPHOS, BILITOT, PROT, ALBUMIN in the last 72 hours. No results for input(s): LIPASE, AMYLASE in the last 72 hours. Cardiac Enzymes No results for input(s): CKTOTAL, CKMB, CKMBINDEX, TROPONINI in the last 72 hours.  BNP: BNP (last 3 results) Recent Labs    12/30/18 1621 03/06/19 1829 04/09/19 1959  BNP 1,273.0* 1,020.1* 1,843.4*    ProBNP (last 3 results) No results for input(s): PROBNP in the last 8760 hours.   D-Dimer No results for input(s): DDIMER in the last 72 hours. Hemoglobin A1C No results for input(s): HGBA1C in the last 72 hours. Fasting Lipid Panel No results for input(s): CHOL, HDL, LDLCALC, TRIG, CHOLHDL, LDLDIRECT in the last 72 hours. Thyroid Function Tests No results for input(s): TSH, T4TOTAL, T3FREE, THYROIDAB in the last 72 hours.  Invalid input(s): FREET3  Other results:   Imaging    No results found.   Medications:     Scheduled Medications: . apixaban  5 mg Oral BID  . bisoprolol  2.5 mg Oral Daily  . chlorhexidine  15 mL Mouth Rinse BID  . ferrous sulfate  325 mg  Oral BID WC  . insulin aspart  0-9 Units Subcutaneous TID WC  . ipratropium-albuterol  3 mL Nebulization TID  . isosorbide-hydrALAZINE  0.5 tablet Oral TID  . mouth rinse  15 mL Mouth Rinse q12n4p  . methylPREDNISolone (SOLU-MEDROL) injection  40 mg Intravenous Q12H  . potassium chloride  40 mEq Oral TID  . potassium chloride  40 mEq Oral Once  . rosuvastatin  20 mg Oral q1800  . torsemide  100 mg Oral BID    Infusions: . sodium chloride 10 mL/hr at 04/12/19 0400  . ceFEPime (MAXIPIME) IV 2 g (04/14/19 0637)    PRN Medications: sodium chloride, acetaminophen **OR** acetaminophen, albuterol, calcium carbonate, hydrALAZINE, morphine injection   Assessment/Plan   1. Acute on chronic systolic CHF: Nonischemic  cardiomyopathy.With prominent RV failure, likely related to COPD/OSA/OHS. TEE in 3/20 with EF 40-45%, severely dilated RV with moderately decreased systolic function. He has had multiple recent admissions with CHF and COPD exacerbations.He has not cooperated with paramedicine or [facility] home health as outpatient, has not been using CPAP per his wife, suspect significant medication noncompliance as well though he denies. During last admission unable to get CVP < 15 despite very aggressive diuretic regimen and inotropic support. I would NOT re-attempt inotropic support as it did not help much and he is not candidate for home inotropes.  On exam today, volume status looks around his baseline (probably mild volume overload but as noted, with RV failure will not get CVP to normal range).  Now, K is low with mildly higher creatinine at 1.59.  - Drop torsemide dose ot 80 mg bid.  Will give extra dose of KCl today . - Restarted bisoprolol 2.5 daily.  - Continue Bidil 0.5 tab tid  - He will stay off spironolactone given admission with hyperkalemia.    - I think his long-term prognosis, unfortunately, will be poor with significant RV dysfunction and history of poor compliance.  2. Anemia: Hgb stable at 9.4.  Last admission had GI workup, colonoscopy 5/8 unremarkable but EGD showed necrotic mucosa gastric fundus/body that is concerning for gastric cancer, biopsies taken which were negative for malignancy.  3. Atrial fibrillation/flutter: Patient has history of paroxysmal atrial fibrillation. Now in NSR with PACs. Was seen in past by Dr. Rayann Heman, not thought to be good ablation candidate. He has not been on Tikosyn or amiodarone due to history of long QT.   - Continue Eliquis.  4. COPD: Severe, on 3L home oxygen.  Possible component of COPD exacerbation, currently on Solumedrol.   - Per primary team  5. OHS/OSA: On home oxygen. Per wife, has not been using CPAP at home.  He refused CPAP last night.  6. ID:  Afebrile and WBCs not significantly elevated.COVID-19 negative.  - He is empirically on cefepime.  7. CAD: Nonobstructive on prior cath. No s/s ischemia. He is on Crestor. Off ASA with DOAC and GI bleed  8. Chronic kidney disease stage III: Baseline seems to be 1.3-1.6.  Creatinine 1.59 today, mildly higher.  - Will back off on torsemide to 80 mg bid.  ?How he is actually taking it at home.   9. DM2 with poor control - TRH managing  10. CVA: At last admission in 5/20, MRI head showed both small acute/subacute and old CVAs.  Neuro has seen, suspect related to atrial fibrillation.  - He is on Eliquis.  11. Hypokalemia: supplement.   Very poor compliance with medications and followup (missed his post-hospital followup).  Not using  CPAP at home.  Has been very hard to get home health or paramedicine in to see him.  I think he would be best served in SNF but he refuses. Would like him to get nurse visit 3 x/week and PT 3x/week through [place] at home.  If he goes home today, will need CHF clinic followup.  Should get labs in 1 week.  Meds for home: torsemide 80 mg bid, Kdur 40 tid, bisoprolol 2.5 daily, apixaban 5 bid, Bidil 1/2 tab tid, Crestor 20 daily.   Length of Stay: [place], [place]  04/14/2019, 10:40 AM  Advanced Heart Failure Team Pager [phone] (M-F; 7a - 4p)  Please contact [facility] for night-coverage after hours (4p -7a ) and weekends on [email or web address]

## 2019-04-14 NOTE — Progress Notes (Signed)
Patient provided with verbal discharge instructions. Paper copy of AVS given to patient. Patient had no further questions. IV's removed per orders. Pt VSS at discharge. Belonging sent with patient at discharge. Patient d/c via wheelchair on 3L of O2 by RN through [employer] entrance to private vehicle. Pt switched to home O2 tank place on 3L upon arrival to vehicle.

## 2019-04-14 NOTE — TOC Transition Note (Addendum)
Transition of Care [facility]) - CM/SW Discharge Note Marvetta Gibbons RN,BSN Transitions of Care Cross Coverage 2C - RN Case Manager [phone]   Patient Details  Name: Nicholas Caldwell MRN: [id number] Date of Birth: 03-21-1946  Transition of Care [facility]) CM/SW Contact:  Dawayne Patricia, RN Phone Number: 04/14/2019, 5:05 PM   Clinical Narrative:    Pt active with [facility] for RN/pt- will need resumption orders for home. Pt active with the [place]- goes to the [place] clinic, April with [facility] aware of admission. Butch Penny with [facility] aware of discharge and need to resume [facility] services. D/c summary has been faxed to the [place] clinic via epic to [phone]- scripts have been sent to the [facility].    Final next level of care: [place] Barriers to Discharge: No Barriers Identified   Patient Goals and CMS Choice Patient states their goals for this hospitalization and ongoing recovery are:: home CMS Medicare.gov Compare Post Acute Care list provided to:: Patient Choice offered to / list presented to : NA  Discharge Placement  Home with [facility]                     Discharge Plan and Services   Discharge Planning Services: CM Consult Post Acute Care Choice: Home Health, Resumption of Svcs/PTA Provider          DME Arranged: N/A DME Agency: NA       HH Arranged: RN, PT [place] Agency: [place] ([place]) Date [place]: 04/14/19 Time [place]: [place] Representative spoke with at [place]: [place] ([place]) Interventions     Readmission Risk Interventions Readmission Risk Prevention Plan 04/14/2019 04/14/2019 03/31/2019  Transportation Screening Complete Complete Complete  Medication Review (RN Care Manager) Complete Complete Complete  PCP or Specialist appointment within 3-5 days of discharge Complete - Not Complete  PCP/Specialist Appt Not Complete comments 6/17 with cardiology - earliest appt with HF clinic is 6/5 at  10am, CM requested [place] PCP appt as well  [place] or Home Care Consult Complete - Complete  SW Recovery Care/Counseling Consult Complete - Complete  Palliative Care Screening Not Applicable - Not [place] Not Applicable - Patient Refused  Some recent data might be hidden

## 2019-04-14 NOTE — Evaluation (Signed)
Physical Therapy Evaluation Patient Details Name: Nicholas Caldwell MRN: [id number] DOB: April 06, 1946 Today's Date: 04/14/2019   History of Present Illness  Nicholas Caldwell 72 y.o.malewith medical history significant ofatrial flutter, paroxysmal atrial fibrillation, CAD, chronic systolic congestive heart failure, COPDon 2-3 L home oxygen, type 2 diabetes, hypertension, hyperlipidemia presenting to the hospital for evaluation of altered mental status.   Clinical Impression  Pt admitted with above diagnosis. Pt currently with functional limitations due to the deficits listed below (see PT Problem List). Pt was able to ambulate with RW with good stability overall.  Wife to assist at home per pt and has gait belt.  Will progress pt as able.   Pt will benefit from skilled PT to increase their independence and safety with mobility to allow discharge to the venue listed below.      Follow Up Recommendations Home health PT;Supervision/Assistance - 24 hour    Equipment Recommendations  None recommended by PT    Recommendations for Other Services       Precautions / Restrictions Precautions Precautions: Fall Restrictions Weight Bearing Restrictions: No      Mobility  Bed Mobility               General bed mobility comments: In chair on arrival  Transfers Overall transfer level: Needs assistance Equipment used: Rolling walker (2 wheeled) Transfers: Sit to/from Stand Sit to Stand: Min guard         General transfer comment: cues for hand placement only  Ambulation/Gait Ambulation/Gait assistance: Min guard Gait Distance (Feet): 100 Feet Assistive device: Rolling walker (2 wheeled) Gait Pattern/deviations: Step-through pattern Gait velocity: decreased Gait velocity interpretation: <1.8 ft/sec, indicate of risk for recurrent falls General Gait Details: Pt on 3L on arrival and sats >90%.  Pt ambulated with slow, mildly stooped posture.  O2 had to be increased to 6L with ambulation  to keep sats >90%.  EHR in the lower 124 max bpm.  No unsteadiness noted.  Pt states wife has gait belt and can assist him at home.  Followed with chair for safety and so pt could walk as far as he wanted.  Fatigued at end of walk.   Stairs            Wheelchair Mobility    Modified Rankin (Stroke Patients Only)       Balance Overall balance assessment: Needs assistance Sitting-balance support: No upper extremity supported;Feet supported Sitting balance-Leahy Scale: Fair     Standing balance support: Bilateral upper extremity supported;During functional activity Standing balance-Leahy Scale: Poor Standing balance comment: reliant of RW for UE support                             Pertinent Vitals/Pain Pain Assessment: Faces Faces Pain Scale: Hurts whole lot Pain Location: right hip Pain Descriptors / Indicators: Discomfort;Sore Pain Intervention(s): Limited activity within patient's tolerance;Monitored during session;Repositioned;Premedicated before session    [place] expects to be discharged to:: Private residence Living Arrangements: Spouse/significant other Available Help at Discharge: Family;Available 24 hours/day Type of Home: House Home Access: Stairs to enter Entrance Stairs-Rails: None Entrance Stairs-Number of Steps: 1 Home Layout: One level Home Equipment: Walker - 2 wheels;Cane - single point;Bedside commode;Shower seat Additional Comments: on 3L of O2 at all times at home    Prior Function Level of Independence: Needs assistance   Gait / Transfers Assistance Needed: uses RW for mobility  ADL's / Homemaking Assistance Needed: assist from wife  for LB ADL and wife does IADL  Comments: States that he needs to get to [place] as they have a wheelchair and scooter for him     Hand Dominance   Dominant Hand: Right    Extremity/Trunk Assessment   Upper Extremity Assessment Upper Extremity Assessment: Defer to OT evaluation     Lower Extremity Assessment Lower Extremity Assessment: Generalized weakness    Cervical / Trunk Assessment Cervical / Trunk Assessment: Kyphotic  Communication   Communication: No difficulties  Cognition Arousal/Alertness: Awake/alert Behavior During Therapy: WFL for tasks assessed/performed Overall Cognitive Status: Within Functional Limits for tasks assessed Area of Impairment: Safety/judgement                         Safety/Judgement: Decreased awareness of safety;Decreased awareness of deficits   Problem Solving: Difficulty sequencing;Decreased initiation;Requires verbal cues;Requires tactile cues        General Comments      Exercises     Assessment/Plan    PT Assessment Patient needs continued PT services  PT Problem List Decreased activity tolerance;Decreased balance;Decreased mobility;Decreased knowledge of use of DME;Decreased safety awareness;Decreased knowledge of precautions       PT Treatment Interventions DME instruction;Gait training;Functional mobility training;Therapeutic activities;Therapeutic exercise;Balance training;Patient/family education;Stair training    PT Goals (Current goals can be found in the Care Plan section)  Acute Rehab PT Goals Patient Stated Goal: to go home PT Goal Formulation: With patient Time For Goal Achievement: 04/28/19 Potential to Achieve Goals: Good    Frequency Min 3X/week   Barriers to discharge        Co-evaluation               AM-PAC PT "6 Clicks" Mobility  Outcome Measure Help needed turning from your back to your side while in a flat bed without using bedrails?: A Little Help needed moving from lying on your back to sitting on the side of a flat bed without using bedrails?: A Little Help needed moving to and from a bed to a chair (including a wheelchair)?: A Little Help needed standing up from a chair using your arms (e.g., wheelchair or bedside chair)?: A Little Help needed to walk in  hospital room?: A Little Help needed climbing 3-5 steps with a railing? : A Little 6 Click Score: 18    End of Session Equipment Utilized During Treatment: Gait belt;Oxygen Activity Tolerance: Patient limited by fatigue Patient left: in chair;with call bell/phone within reach;with nursing/sitter in room Nurse Communication: Mobility status PT Visit Diagnosis: Unsteadiness on feet (R26.81);Difficulty in walking, not elsewhere classified (R26.2)    Time: 0272-5366 PT Time Calculation (min) (ACUTE ONLY): 25 min   Charges:   PT Evaluation $PT Eval Moderate Complexity: 1 Mod PT Treatments $Gait Training: 8-22 mins        [place] Pager:  [phone]  Office:  [place] 04/14/2019, 10:02 AM

## 2019-04-14 NOTE — Discharge Summary (Signed)
Physician Discharge Summary  Nicholas Caldwell RJJ:[id number] DOB: [birth year] DOA: 04/09/2019  PCP: [facility]  Admit date: 04/09/2019 Discharge date: 04/14/2019  Admitted From: home  Disposition:  home   Recommendations for Outpatient Follow-up:  1. I have spoken with his wife and advised that she should administer his medications for at least 1 wk while watching mental status. I have further explained need for dietary restrictions and use of CPAP every night.   Home Health:  ordered     Discharge Condition:  stable   CODE STATUS:  Full code   Diet recommendation:  Heart healthy, Diabetic, fluid restrict to 1200 or fluids/day Consultations:  cardiology    Discharge Diagnoses:  Principal Problem:   Acute respiratory failure ([place]) Active Problems: Lobar pneumonia ([place])   COPD exacerbation    Acute on chronic combined systolic and diastolic CHF (congestive heart failure) (H[facility])   Acute encephalopathy   Acute respiratory failure with hypoxia and hypercapnia (H[facility])   Metabolic encephalopathy   Hyperkalemia   Morbid obesity ([facility])   PAF (paroxysmal atrial fibrillation) ([place])   Medically noncompliant   Diabetes mellitus type 2 in obese [facility])   Essential hypertension   Subjective: Asking to go home. Has no complaints today.   Brief Summary: Nicholas Caldwell 73 y.o.malewith medical history significant ofatrial flutter, paroxysmal atrial fibrillation, CAD, chronic systolic congestive heart failure, COPDon 2-3 L home oxygen, type 2 diabetes, hypertension, hyperlipidemia presenting to the hospital for evaluation of altered mental status.  He was recently hospitalized and treated for a UTI. He has been off of his antibiotic for the last 2 days. In the ED, initially on 3 L supplemental oxygen, subsequently required BiPAP for increased WOB.Noted to be wheezing.No leukocytosis. Lactic acid normal. Potassium >7.5. Chest x-ray showing moderate to large left and small-to-moderate  right pleural effusions with bibasilar areas of atelectasis and/or consolidation. Also showing cardiomegaly with probable pulmonary edema suggestive of CHF.  Patient received albuterol, IV Lasix 40 mg, Solu-Medrol 125 mg, and cefepime in the ED. Previous hospitalization briefly reviewed, patient was hospitalized from 5/3 to 5/28. At that time he was admitted with progressive dyspnea with elevated BNP greater than 1000, atrial fibrillation as well as onset of anemia. Hospital course at that time was complicated by ischemic stroke, generalized weakness UTI, HCAP. He had declined rehab placement and went home.   Hospital Course:  Principal Problem:   Acute on chronic respiratory failure Acute exacerbation of chronic systolic CHF Hypercapnicrespiratory failuresecondary to acute COPD exacerbation - he has been diuresed and is now on oral diuretics (demadex)_ - a thoracentesis was planned but when he went down to IR on 6/9, no effusions were noted - cont nebs- weaning Solumedrol- no wheezing on exam - on cefepime for possible pneumonia-  noted to have a cough on exam- CXR> left basilar pneumonia vs atelectasis, had a cough as well - change to  - on his baseline 3 L O2 currently-  - per cardiology, d/c meds> torsemide 80 mg bid, Kdur 40 tid, bisoprolol 2.5 daily, apixaban 5 bid, Bidil 1/2 tab tid, Crestor 20 daily.   Active Problems:     Acute metabolic encephalopathy - main reason for presenting to hospital- has resolved- I have spoken with his wife who agrees that he is back to his baseline - due to infection? - UA negative on admission but was on antibiotics as outpt- today will be day 5 of Cefepime   - Head CT on 6/6 negative he had a low  normal Vit B12 on 6/7 which was 337- will replace via s/c injection - ? Due to recent frontal CVA- MRI on 5/17 > > subcentimeter acute/early subacute infarctions within the left posterior frontal lobe subcortical white matter - ? Due to CO2  retention- he refuses CPAP- have told him and his wife that he needs to use it at home - ammonia level was a little elevated but no h/o cirrhosis - he is on Lactulose and Valium PRN but has not been receiving them over the past few days-  will d/c these    PAF (paroxysmal atrial fibrillation)/ a flutter - cont Apixaban - not a good candidate for ablation per EP - not on Tikosyn or amiodarone due to long QT    Hyperkalemia Hypokalemia/ hypomagnesemia - hyperkalemic on admission- improved and then became hypokalemic- - hypomagnesemia noted as well- replacing  CVA- a few wks ago  - cont DOAC and statin    Diabetes mellitus type 2 in obese    - on 70/30 and Novolog TID at home  Morbid obesity Body mass index is 34.18 kg/m.    Discharge Exam: Vitals:   04/14/19 0758 04/14/19 1145  BP: (!) 105/54 105/65  Pulse:  73  Resp:  (!) 21  Temp: 98.9 F (37.2 C) 98.5 F (36.9 C)  SpO2:  96%   Vitals:   04/14/19 0717 04/14/19 0720 04/14/19 0758 04/14/19 1145  BP:  (!) 111/51 (!) 105/54 105/65  Pulse:  77  73  Resp:  (!) 21  (!) 21  Temp:   98.9 F (37.2 C) 98.5 F (36.9 C)  TempSrc:   Oral Oral  SpO2: 96% 94%  96%  Weight:      Height:        General: Pt is alert, awake, not in acute distress Cardiovascular: RRR, S1/S2 +, no rubs, no gallops Respiratory: CTA bilaterally, no wheezing, no rhonchi Abdominal: Soft, NT, ND, bowel sounds + Extremities: no edema, no cyanosis   Discharge Instructions  Discharge Instructions    Diet - low sodium heart healthy   Complete by: As directed    Restrict fluids to < 1.2 L at day Restrict sodium to < 2 gm daily Diabetic, low fat diet   Increase activity slowly   Complete by: As directed      Allergies as of 04/14/2019   No Known Allergies     Medication List    STOP taking these medications   diazepam 5 MG tablet Commonly known as: VALIUM     TAKE these medications   albuterol 108 (90 Base) MCG/ACT  inhaler Commonly known as: VENTOLIN HFA Inhale 2 puffs into the lungs every 6 (six) hours as needed for wheezing or shortness of breath.   apixaban 5 MG Tabs tablet Commonly known as: ELIQUIS Take 1 tablet (5 mg total) by mouth 2 (two) times daily.   bisoprolol 5 MG tablet Commonly known as: ZEBETA Take 0.5 tablets (2.5 mg total) by mouth daily.   budesonide-formoterol 160-4.5 MCG/ACT inhaler Commonly known as: SYMBICORT Inhale 2 puffs into the lungs 2 (two) times daily.   cefUROXime 500 MG tablet Commonly known as: CEFTIN Take 1 tablet (500 mg total) by mouth 2 (two) times daily with a meal for 2 days. Start taking on: April 15, 2019   ferrous sulfate 325 (65 FE) MG tablet Take 1 tablet (325 mg total) by mouth 2 (two) times daily with a meal.   fluticasone 50 MCG/ACT nasal spray Commonly known as:  FLONASE Place 2 sprays into both nostrils daily as needed for allergies.   gabapentin 600 MG tablet Commonly known as: NEURONTIN Take 1 tablet (600 mg total) by mouth 2 (two) times daily.   guaifenesin 400 MG Tabs tablet Commonly known as: HUMIBID E Take 1 tablet (400 mg total) by mouth every 6 (six) hours as needed. What changed: reasons to take this   hydrocortisone 1 % lotion Apply 1 application topically See admin instructions. Apply small amount to back twice daily for itchy rash   hydrocortisone 2.5 % rectal cream Commonly known as: ANUSOL-HC Place 1 application rectally 2 (two) times daily as needed for hemorrhoids or itching.   insulin aspart 100 UNIT/ML injection Commonly known as: novoLOG Inject 4 Units into the skin 3 (three) times daily with meals.   insulin aspart protamine - aspart (70-30) 100 UNIT/ML FlexPen Commonly known as: NovoLOG Mix 70/30 FlexPen Inject 0.1 mLs (10 Units total) into the skin 2 (two) times daily.   ipratropium-albuterol 0.5-2.5 (3) MG/3ML Soln Commonly known as: DUONEB Take 3 mLs by nebulization 2 (two) times daily as needed  (shortness of breath/wheezing).   isosorbide-hydrALAZINE 20-37.5 MG tablet Commonly known as: BIDIL Take 0.5 tablets by mouth 3 (three) times daily.   latanoprost 0.005 % ophthalmic solution Commonly known as: XALATAN Place 1 drop into both eyes at bedtime.   metolazone 2.5 MG tablet Commonly known as: ZAROXOLYN Take 1 tablet (2.5 mg total) by mouth every Wednesday. Take extra Potassium 97mq when taking this medication  **Please also take Metolazone 2.5 mg Every Friday and Take extra Potassium 239m when taking this medication.**   OXYGEN Inhale 3 L into the lungs continuous.   pantoprazole 40 MG tablet Commonly known as: PROTONIX Take 1 tablet (40 mg total) by mouth 2 (two) times daily.   polyethylene glycol 17 g packet Commonly known as: MIRALAX / GLYCOLAX Take 17 g by mouth daily. What changed:   when to take this  reasons to take this   Potassium Chloride ER 20 MEQ Tbcr Take 40 mEq by mouth 3 (three) times daily.   predniSONE 10 MG tablet Commonly known as: DELTASONE Take 6 tablets (60 mg total) by mouth daily with breakfast. Take 6 tabs tomorrow and then decrease by 1 tab daily until finished   rosuvastatin 20 MG tablet Commonly known as: CRESTOR Take 1 tablet (20 mg total) by mouth daily at 6 PM.   sodium chloride 0.65 % Soln nasal spray Commonly known as: OCEAN Place 2 sprays into both nostrils as needed for congestion.   spironolactone 25 MG tablet Commonly known as: ALDACTONE Take 1 tablet (25 mg total) by mouth daily.   torsemide 20 MG tablet Commonly known as: DEMADEX Take 4 tablets (80 mg total) by mouth 2 (two) times daily. What changed:   medication strength  how much to take      Follow-up Information    [facility].   Specialty: Cardiology Why: at 09:30 GaRanlonformation: [phone]       No Known Allergies   Procedures/Studies:    Dg Chest 2 View  Result Date: 04/14/2019 CLINICAL DATA:  Hypoxia. EXAM: CHEST - 2 VIEW COMPARISON:  Chest ultrasound dated April 12, 2019. Chest x-ray dated April 09, 2019. FINDINGS: Stable cardiomegaly. Improved pulmonary edema with residual mild interstitial edema. Improved aeration at the  right lung base. Unchanged left basilar atelectasis and/or consolidation. Trace bilateral pleural effusions. No pneumothorax. No acute osseous abnormality. IMPRESSION: 1. Improving pulmonary edema. Unchanged left basilar atelectasis and/or consolidation. 2. Unchanged trace bilateral pleural effusions. Electronically Signed   By: Titus Dubin M.D.   On: 04/14/2019 11:34   Ct Head Wo Contrast  Result Date: 04/09/2019 CLINICAL DATA:  Altered level of consciousness over the past 2 days. Bladder infection 2 weeks ago. Not taking antibiotics. EXAM: CT HEAD WITHOUT CONTRAST TECHNIQUE: Contiguous axial images were obtained from the base of the skull through the vertex without intravenous contrast. COMPARISON:  CT 03/18/2019 and MRI 03/20/2019 FINDINGS: Brain: Ventricles, cisterns and other CSF spaces are within normal. Cavum septum variant is present. Subtle chronic ischemic microvascular disease. Stable old small cerebellar infarcts. No mass, mass effect, shift of midline structures or acute hemorrhage. No evidence of acute infarction. Vascular: No hyperdense vessel or unexpected calcification. Skull: Normal. Negative for fracture or focal lesion. Sinuses/Orbits: Orbits are normal symmetric. Paranasal sinuses are normal. Other: None. IMPRESSION: No acute findings. Minimal chronic ischemic microvascular disease. Small old lacunar cerebellar infarcts. Electronically Signed   By: Marin Olp M.D.   On: 04/09/2019 21:15   Ct Head Wo Contrast  Result Date: 03/18/2019 CLINICAL DATA:  73 year old male with seizure-like activity, altered mental  status. EXAM: CT HEAD WITHOUT CONTRAST TECHNIQUE: Contiguous axial images were obtained from the base of the skull through the vertex without intravenous contrast. COMPARISON:  None. FINDINGS: Brain: Linear hypodensities in both cerebellar hemispheres most compatible with small infarcts, the larger is on the right, and appears more age indeterminate. The smaller area on the left appears chronic. Comparatively mild cerebral white matter hypodensity in both hemispheres. No cortical encephalomalacia identified. Deep gray matter nuclei are within normal limits for age. No midline shift, ventriculomegaly, mass effect, evidence of mass lesion, intracranial hemorrhage or evidence of cortically based acute infarction. Cavum septum pellucidum, normal variant. Vascular: Calcified atherosclerosis at the skull base. No suspicious intracranial vascular hyperdensity. Skull: No acute osseous abnormality identified. Sinuses/Orbits: Paranasal sinuses and mastoids are well pneumatized. Other: Visualized orbits and scalp soft tissues are within normal limits. IMPRESSION: 1. Small bilateral cerebellar infarcts, probably chronic although that on the right is age indeterminate. 2. Largely normal for age noncontrast CT appearance of the cerebral hemispheres and no other acute intracranial abnormality. Electronically Signed   By: Genevie Ann M.D.   On: 03/18/2019 05:45   Mr Brain Wo Contrast  Result Date: 03/20/2019 CLINICAL DATA:  73 y/o M; altered level of consciousness, unexplained. Abnormal CT head. EXAM: MRI HEAD WITHOUT CONTRAST TECHNIQUE: Multiplanar, multiecho pulse sequences of the brain and surrounding structures were obtained without intravenous contrast. COMPARISON:  03/18/2019 CT head FINDINGS: Brain: Small group of subcentimeter foci of reduced diffusion within left posterior frontal lobe subcortical white matter compatible with acute/early subacute infarction. No associated hemorrhage or mass effect. Very small chronic  infarctions are present within the bilateral cerebellar hemispheres. Early confluent nonspecific T2 FLAIR hyperintensities in subcortical and periventricular white matter as well as the pons are compatible with moderate chronic microvascular ischemic changes. Mild volume loss of the brain. Cavum septum pellucidum. Vascular: Normal flow voids. Skull and upper cervical spine: Normal marrow signal. Sinuses/Orbits: Negative. Other: None. IMPRESSION: 1. Small group of subcentimeter acute/early subacute infarctions within the left posterior frontal lobe subcortical white matter. No associated hemorrhage or mass effect. 2. Moderate chronic microvascular ischemic changes and mild volume loss of the brain. Very small chronic infarctions  within the bilateral cerebellar hemispheres. These results will be called to the ordering clinician or representative by the Radiologist Assistant, and communication documented in the PACS or zVision Dashboard. Electronically Signed   By: Kristine Garbe M.D.   On: 03/20/2019 22:38   Dg Chest Port 1 View  Result Date: 04/09/2019 CLINICAL DATA:  73 year old male with history of bladder infection 2 weeks ago. EXAM: PORTABLE CHEST 1 VIEW COMPARISON:  Chest x-ray 03/25/2019. FINDINGS: Previously noted right upper extremity PICC has been removed. Diffuse haziness and patchy interstitial opacities throughout the lungs bilaterally. Confluent opacity in the lung bases bilaterally (left greater than right), likely to reflect moderate to large left and small to moderate right pleural effusions with associated areas of bibasilar atelectasis and/or airspace consolidation. Enlargement of the cardiopericardial silhouette which appears very indistinct. Pulmonary vasculature is obscured. The patient is rotated to the left on today's exam, resulting in distortion of the mediastinal contours and reduced diagnostic sensitivity and specificity for mediastinal pathology. Aortic atherosclerosis.  IMPRESSION: 1. Moderate to large left and small to moderate right pleural effusions with bibasilar areas of atelectasis and/or consolidation. 2. Cardiomegaly with probable pulmonary edema, suggestive of congestive heart failure. 3. Aortic atherosclerosis. Electronically Signed   By: Vinnie Langton M.D.   On: 04/09/2019 21:20   Dg Chest Port 1 View  Result Date: 03/25/2019 CLINICAL DATA:  Cough EXAM: PORTABLE CHEST 1 VIEW COMPARISON:  03/08/2019 FINDINGS: The right-sided PICC line tip terminates over the proximal SVC. The cardiac silhouette is enlarged. Diffuse hazy bilateral airspace opacities are again noted. There is a dense retrocardiac opacity. Bilateral pleural effusions are noted. There is no pneumothorax. IMPRESSION: 1. Cardiomegaly with findings suspicious for underlying pulmonary edema. An atypical infectious process can have a similar appearance. 2. Retrocardiac opacity favored to represent atelectasis with infiltrate not excluded. 3. Small bilateral pleural effusions. 4. Right-sided PICC line tip terminates over the SVC. Electronically Signed   By: Constance Holster M.D.   On: 03/25/2019 18:33   Ir US Chest  Result Date: 04/12/2019 CLINICAL DATA:  Pleural effusions by chest x-ray assess for thoracentesis EXAM: ULTRASOUND OF CHEST SOFT TISSUES TECHNIQUE: Ultrasound examination of the chest wall soft tissues was performed in the area of clinical concern. COMPARISON:  04/09/2019 FINDINGS: Ultrasound performed of the posterior chest bilaterally. Trace pleural effusions bilaterally. There is not enough to warrant thoracentesis. Procedure not performed. IMPRESSION: Trace bilateral pleural effusions.  Thoracentesis not performed. Electronically Signed   By: Jerilynn Mages.  Shick M.D.   On: 04/12/2019 15:37   Vas US Carotid (at [facility] Only)  Result Date: 03/22/2019 Carotid Arterial Duplex Study Indications:       CVA. Risk Factors:      Hypertension, hyperlipidemia, Diabetes. Other Factors:     Atrial  fibrillation, COPD, OSA, CHF, NICM. Limitations:       Body habitus, Atrial fibrillation Comparison Study:  No prior study on file for comparison Performing Technologist: Sharion Dove RVS  Examination Guidelines: A complete evaluation includes B-mode imaging, spectral Doppler, color Doppler, and power Doppler as needed of all accessible portions of each vessel. Bilateral testing is considered an integral part of a complete examination. Limited examinations for reoccurring indications may be performed as noted.  Right Carotid Findings: +----------+--------+--------+--------+--------+------------------+           PSV cm/sEDV cm/sStenosisDescribeComments           +----------+--------+--------+--------+--------+------------------+ CCA Prox  89      33  intimal thickening +----------+--------+--------+--------+--------+------------------+ CCA Distal72      20                      intimal thickening +----------+--------+--------+--------+--------+------------------+ ICA Prox  63      22                                         +----------+--------+--------+--------+--------+------------------+ ICA Distal94      29                                         +----------+--------+--------+--------+--------+------------------+ ECA       76      12                                         +----------+--------+--------+--------+--------+------------------+ +----------+--------+-------+--------+-------------------+           PSV cm/sEDV cmsDescribeArm Pressure (mmHG) +----------+--------+-------+--------+-------------------+ [id number]                                        +----------+--------+-------+--------+-------------------+ +---------+--------+--+--------+--+ VertebralPSV cm/s62EDV cm/s23 +---------+--------+--+--------+--+  Left Carotid Findings: +----------+--------+--------+--------+--------+------------------+           PSV cm/sEDV  cm/sStenosisDescribeComments           +----------+--------+--------+--------+--------+------------------+ CCA Prox  87      31                      intimal thickening +----------+--------+--------+--------+--------+------------------+ CCA Distal87      34                      intimal thickening +----------+--------+--------+--------+--------+------------------+ ICA Prox  84      29                                         +----------+--------+--------+--------+--------+------------------+ ICA Distal75      30                                         +----------+--------+--------+--------+--------+------------------+ ECA       99      13                                         +----------+--------+--------+--------+--------+------------------+ +----------+--------+--------+--------+-------------------+ SubclavianPSV cm/sEDV cm/sDescribeArm Pressure (mmHG) +----------+--------+--------+--------+-------------------+           96                                          +----------+--------+--------+--------+-------------------+ +---------+--------+--+--------+--+ VertebralPSV cm/s44EDV cm/s19 +---------+--------+--+--------+--+  Summary: Right Carotid: The extracranial vessels were near-normal with only minimal wall                thickening or plaque.  Left Carotid: The extracranial vessels were near-normal with only minimal wall               thickening or plaque. Vertebrals:  Bilateral vertebral arteries demonstrate antegrade flow. Subclavians: Normal flow hemodynamics were seen in bilateral subclavian              arteries. *See table(s) above for measurements and observations.  Electronically signed by Antony Contras MD on 03/22/2019 at 8:41:35 AM.    Final      The results of significant diagnostics from this hospitalization (including imaging, microbiology, ancillary and laboratory) are listed below for reference.     Microbiology: Recent Results (from  the past 240 hour(s))  Culture, blood (routine x 2)     Status: None   Collection Time: 04/09/19  7:59 PM   Specimen: BLOOD RIGHT ARM  Result Value Ref Range Status   Specimen Description   Final    BLOOD RIGHT ARM Performed at [facility], [place] [address], [place], [place] [address]    Special Requests   Final    BOTTLES DRAWN AEROBIC ONLY Blood Culture results may not be optimal due to an inadequate volume of blood received in culture bottles Performed at [place] [address], [place], [place] [address]    Culture   Final    NO GROWTH 5 DAYS Performed at [facility], [place] [address], [place], [place] [address]    Report Status 04/14/2019 FINAL  Final  Culture, blood (routine x 2)     Status: None   Collection Time: 04/09/19  7:59 PM   Specimen: BLOOD RIGHT ARM  Result Value Ref Range Status   Specimen Description   Final    BLOOD RIGHT ARM Performed at [place] [address] [place], [place] [address]    Special Requests   Final    BOTTLES DRAWN AEROBIC AND ANAEROBIC Blood Culture results may not be optimal due to an excessive volume of blood received in culture bottles Performed at [facility] [address] [place], [place] [address]    Culture   Final    NO GROWTH 5 DAYS Performed at [facility], [place] [address] [place], [place] [address]    Report Status 04/14/2019 FINAL  Final  SARS Coronavirus 2 (CEPHEID - Performed in [place] hospital lab), Hosp Order     Status: None   Collection Time: 04/09/19  7:59 PM   Specimen: Nasopharyngeal Swab  Result Value Ref Range Status   SARS Coronavirus 2 NEGATIVE NEGATIVE Final    Comment: (NOTE) If result is NEGATIVE SARS-CoV-2 target nucleic acids are NOT DETECTED. The SARS-CoV-2 RNA is generally detectable in upper and lower  respiratory specimens during the acute phase of infection. The lowest  concentration of  SARS-CoV-2 viral copies this assay can detect is 250  copies / mL. A negative result does not preclude SARS-CoV-2 infection  and should not be used as the sole basis for treatment or other  patient management decisions.  A negative result may occur with  improper specimen collection / handling, submission of specimen other  than nasopharyngeal swab, presence of viral mutation(s) within the  areas targeted by this assay, and inadequate number of viral copies  (<250 copies / mL). A negative result must be combined with clinical  observations, patient history, and epidemiological information. If result is POSITIVE SARS-CoV-2 target nucleic acids are DETECTED. The SARS-CoV-2 RNA is  generally detectable in upper and lower  respiratory specimens dur ing the acute phase of infection.  Positive  results are indicative of active infection with SARS-CoV-2.  Clinical  correlation with patient history and other diagnostic information is  necessary to determine patient infection status.  Positive results do  not rule out bacterial infection or co-infection with other viruses. If result is PRESUMPTIVE POSTIVE SARS-CoV-2 nucleic acids MAY BE PRESENT.   A presumptive positive result was obtained on the submitted specimen  and confirmed on repeat testing.  While 2019 novel coronavirus  (SARS-CoV-2) nucleic acids may be present in the submitted sample  additional confirmatory testing may be necessary for epidemiological  and / or clinical management purposes  to differentiate between  SARS-CoV-2 and other Sarbecovirus currently known to infect humans.  If clinically indicated additional testing with an alternate test  methodology [phone]) is advised. The SARS-CoV-2 RNA is generally  detectable in upper and lower respiratory sp ecimens during the acute  phase of infection. The expected result is Negative. Fact Sheet for Patients:  StrictlyIdeas.no Fact Sheet for Healthcare  Providers: BankingDealers.co.za This test is not yet approved or cleared by the [place] FDA and has been authorized for detection and/or diagnosis of SARS-CoV-2 by FDA under an Emergency Use Authorization (EUA).  This EUA will remain in effect (meaning this test can be used) for the duration of the COVID-19 declaration under Section 564(b)(1) of the Act, 21 U.S.C. section 360bbb-3(b)(1), unless the authorization is terminated or revoked sooner. Performed at [facility], [place] [address] [place], [place] [address]   Urine culture     Status: None   Collection Time: 04/09/19 11:28 PM   Specimen: Urine, Random  Result Value Ref Range Status   Specimen Description   Final    URINE, RANDOM Performed at [place] [address] [place], [place] [address]    Special Requests   Final    NONE Performed at [facility], [place] [address] [place], [place] [address]    Culture   Final    NO GROWTH Performed at [facility], [place] [address], [place], [place] [address]    Report Status 04/11/2019 FINAL  Final  MRSA PCR Screening     Status: None   Collection Time: 04/10/19  1:34 AM   Specimen: Nasal Mucosa; Nasopharyngeal  Result Value Ref Range Status   MRSA by PCR NEGATIVE NEGATIVE Final    Comment:        The GeneXpert MRSA Assay (FDA approved for NASAL specimens only), is one component of a comprehensive MRSA colonization surveillance program. It is not intended to diagnose MRSA infection nor to guide or monitor treatment for MRSA infections. Performed at [facility], [place] [address] [place],  [address]      Labs: BNP (last 3 results) Recent Labs    12/30/18 1621 03/06/19 1829 04/09/19 1959  BNP 1,273.0* 1,020.1* 5,956.3*   Basic Metabolic Panel: Recent Labs  Lab 04/10/19 8756  04/10/19 1636 04/11/19 1256 04/12/19 0231 04/13/19 0255  04/14/19 0226  NA 144  --   --  146* 146* 142 137  K 7.3*   < > 5.2* 3.2* 3.1* 2.9* 3.1*  CL 110  --   --  99 100 94* 89*  CO2 28  --   --  31 33* 35* 35*  GLUCOSE 121*  --   --  170* 259* 239* 194*  BUN 33*  --   --  37* 38* 42* 53*  CREATININE 1.32*  --   --  1.50* 1.44* 1.34* 1.59*  CALCIUM 9.4  --   --  9.0 8.5* 8.0* 8.0*  MG  --   --   --   --   --  1.3* 1.7   < > = values in this interval not displayed.   Liver Function Tests: Recent Labs  Lab 04/09/19 1959  AST 19  ALT 25  ALKPHOS 171*  BILITOT 0.2*  PROT 7.5  ALBUMIN 3.4*   No results for input(s): LIPASE, AMYLASE in the last 168 hours. Recent Labs  Lab 04/10/19 0154  AMMONIA 67*   CBC: Recent Labs  Lab 04/09/19 1959 04/09/19 2017 04/12/19 0231 04/13/19 0255 04/14/19 0226  WBC 6.0  --  5.1 8.7 11.3*  NEUTROABS 4.2  --   --   --   --   HGB 9.9* 11.6* 9.2* 9.4* 9.4*  HCT 37.3* 34.0* 30.1* 30.4* 30.5*  MCV 95.2  --  83.1 82.6 82.0  PLT 319  --  318 323 329   Cardiac Enzymes: Recent Labs  Lab 04/09/19 1959 04/10/19 1141 04/10/19 1636  TROPONINI <0.03 0.03* 0.03*   BNP: Invalid input(s): POCBNP CBG: Recent Labs  Lab 04/13/19 0619 04/13/19 1141 04/13/19 1639 04/13/19 2115 04/14/19 0616  GLUCAP 209* 193* 228* 196* 171*   D-Dimer No results for input(s): DDIMER in the last 72 hours. Hgb A1c No results for input(s): HGBA1C in the last 72 hours. Lipid Profile No results for input(s): CHOL, HDL, LDLCALC, TRIG, CHOLHDL, LDLDIRECT in the last 72 hours. Thyroid function studies No results for input(s): TSH, T4TOTAL, T3FREE, THYROIDAB in the last 72 hours.  Invalid input(s): FREET3 Anemia work up No results for input(s): VITAMINB12, FOLATE, FERRITIN, TIBC, IRON, RETICCTPCT in the last 72 hours. Urinalysis    Component Value Date/Time   COLORURINE STRAW (A) 04/09/2019 2328   APPEARANCEUR CLEAR 04/09/2019 2328   LABSPEC 1.009 04/09/2019 2328   PHURINE 5.0 04/09/2019 2328   GLUCOSEU NEGATIVE  04/09/2019 2328   HGBUR NEGATIVE 04/09/2019 2328   BILIRUBINUR NEGATIVE 04/09/2019 [place] 04/09/2019 2328   PROTEINUR NEGATIVE 04/09/2019 2328   UROBILINOGEN 0.2 10/26/2014 2206   NITRITE NEGATIVE 04/09/2019 2328   LEUKOCYTESUR NEGATIVE 04/09/2019 2328   Sepsis Labs Invalid input(s): PROCALCITONIN,  WBC,  LACTICIDVEN Microbiology Recent Results (from the past 240 hour(s))  Culture, blood (routine x 2)     Status: None   Collection Time: 04/09/19  7:59 PM   Specimen: BLOOD RIGHT ARM  Result Value Ref Range Status   Specimen Description   Final    BLOOD RIGHT ARM Performed at [facility], [place] [address], [place], [place] [address]    Special Requests   Final    BOTTLES DRAWN AEROBIC ONLY Blood Culture results may not be optimal due to an inadequate volume of blood received in culture bottles Performed at [place] [address] [place], [place] [address]    Culture   Final    NO GROWTH 5 DAYS Performed at [facility], [place] [address], [place], [place] [address]    Report Status 04/14/2019 FINAL  Final  Culture, blood (routine x 2)     Status: None   Collection Time: 04/09/19  7:59 PM   Specimen: BLOOD RIGHT ARM  Result Value Ref Range Status   Specimen Description   Final    BLOOD RIGHT ARM Performed at  [facility], [place] [address], [place], [place] [address]    Special Requests   Final    BOTTLES DRAWN AEROBIC AND ANAEROBIC Blood Culture results may not be optimal due to an excessive volume of blood received in culture bottles Performed at [place] [address], [place], [place] [address]    Culture   Final    NO GROWTH 5 DAYS Performed at [facility], [place] [address] [place], [place] [address]    Report Status 04/14/2019 FINAL  Final  SARS Coronavirus 2 (CEPHEID - Performed in [place] [facility] lab), Hosp Order     Status: None   Collection Time:  04/09/19  7:59 PM   Specimen: Nasopharyngeal Swab  Result Value Ref Range Status   SARS Coronavirus 2 NEGATIVE NEGATIVE Final    Comment: (NOTE) If result is NEGATIVE SARS-CoV-2 target nucleic acids are NOT DETECTED. The SARS-CoV-2 RNA is generally detectable in upper and lower  respiratory specimens during the acute phase of infection. The lowest  concentration of SARS-CoV-2 viral copies this assay can detect is 250  copies / mL. A negative result does not preclude SARS-CoV-2 infection  and should not be used as the sole basis for treatment or other  patient management decisions.  A negative result may occur with  improper specimen collection / handling, submission of specimen other  than nasopharyngeal swab, presence of viral mutation(s) within the  areas targeted by this assay, and inadequate number of viral copies  (<250 copies / mL). A negative result must be combined with clinical  observations, patient history, and epidemiological information. If result is POSITIVE SARS-CoV-2 target nucleic acids are DETECTED. The SARS-CoV-2 RNA is generally detectable in upper and lower  respiratory specimens dur ing the acute phase of infection.  Positive  results are indicative of active infection with SARS-CoV-2.  Clinical  correlation with patient history and other diagnostic information is  necessary to determine patient infection status.  Positive results do  not rule out bacterial infection or co-infection with other viruses. If result is PRESUMPTIVE POSTIVE SARS-CoV-2 nucleic acids MAY BE PRESENT.   A presumptive positive result was obtained on the submitted specimen  and confirmed on repeat testing.  While 2019 novel coronavirus  (SARS-CoV-2) nucleic acids may be present in the submitted sample  additional confirmatory testing may be necessary for epidemiological  and / or clinical management purposes  to differentiate between  SARS-CoV-2 and other Sarbecovirus currently known to  infect humans.  If clinically indicated additional testing with an alternate test  methodology [phone]) is advised. The SARS-CoV-2 RNA is generally  detectable in upper and lower respiratory sp ecimens during the acute  phase of infection. The expected result is Negative. Fact Sheet for Patients:  StrictlyIdeas.no Fact Sheet for Healthcare Providers: BankingDealers.co.za This test is not yet approved or cleared by the [place] FDA and has been authorized for detection and/or diagnosis of SARS-CoV-2 by FDA under an Emergency Use Authorization (EUA).  This EUA will remain in effect (meaning this test can be used) for the duration of the COVID-19 declaration under Section 564(b)(1) of the Act, 21 U.S.C. section 360bbb-3(b)(1), unless the authorization is terminated or revoked sooner. Performed at [facility], [place] [address] [place], [place] [address]   Urine culture     Status: None   Collection Time: 04/09/19 11:28 PM   Specimen: Urine, Random  Result Value Ref Range Status   Specimen Description   Final  URINE, RANDOM Performed at [facility], [place] [address], [place], [place] [address]    Special Requests   Final    NONE Performed at [facility], [place] [address] [place], [place] [address]    Culture   Final    NO GROWTH Performed at [facility], [place] [address], [place], [place] [address]    Report Status 04/11/2019 FINAL  Final  MRSA PCR Screening     Status: None   Collection Time: 04/10/19  1:34 AM   Specimen: Nasal Mucosa; Nasopharyngeal  Result Value Ref Range Status   MRSA by PCR NEGATIVE NEGATIVE Final    Comment:        The GeneXpert MRSA Assay (FDA approved for NASAL specimens only), is one component of a comprehensive MRSA colonization surveillance program. It is not intended to diagnose MRSA infection nor to guide or monitor treatment  for MRSA infections. Performed at [facility], [place] [address], [place],  [address]      Time coordinating discharge in minutes: 56  SIGNED:   Debbe Odea, MD  [facility] 04/14/2019, 2:54 PM Pager   If 7PM-7AM, please contact night-coverage [email or web address] Password [email or web address]

## 2019-04-15 ENCOUNTER — Other Ambulatory Visit (HOSPITAL_COMMUNITY): Payer: Self-pay

## 2019-04-15 MED ORDER — CEFUROXIME AXETIL 500 MG PO TABS: 500.0000 mg | ORAL_TABLET | Freq: Two times a day (BID) | ORAL | 0 refills | Status: AC

## 2019-04-15 MED ORDER — PREDNISONE 10 MG PO TABS: 60.0000 mg | ORAL_TABLET | Freq: Every day | ORAL | 0 refills | Status: DC

## 2019-04-18 ENCOUNTER — Other Ambulatory Visit (HOSPITAL_COMMUNITY): Payer: Self-pay | Admitting: *Deleted

## 2019-04-18 DIAGNOSIS — J440 Chronic obstructive pulmonary disease with acute lower respiratory infection: Secondary | ICD-10-CM | POA: Diagnosis not present

## 2019-04-18 DIAGNOSIS — J189 Pneumonia, unspecified organism: Secondary | ICD-10-CM | POA: Diagnosis not present

## 2019-04-18 DIAGNOSIS — I509 Heart failure, unspecified: Secondary | ICD-10-CM | POA: Diagnosis not present

## 2019-04-18 DIAGNOSIS — N390 Urinary tract infection, site not specified: Secondary | ICD-10-CM | POA: Diagnosis not present

## 2019-04-18 DIAGNOSIS — J441 Chronic obstructive pulmonary disease with (acute) exacerbation: Secondary | ICD-10-CM | POA: Diagnosis not present

## 2019-04-18 DIAGNOSIS — A419 Sepsis, unspecified organism: Secondary | ICD-10-CM | POA: Diagnosis not present

## 2019-04-18 DIAGNOSIS — B9689 Other specified bacterial agents as the cause of diseases classified elsewhere: Secondary | ICD-10-CM | POA: Diagnosis not present

## 2019-04-18 MED ORDER — PANTOPRAZOLE SODIUM 40 MG PO TBEC: 40.0000 mg | DELAYED_RELEASE_TABLET | Freq: Two times a day (BID) | ORAL | 0 refills | Status: DC

## 2019-04-19 DIAGNOSIS — J189 Pneumonia, unspecified organism: Secondary | ICD-10-CM | POA: Diagnosis not present

## 2019-04-19 DIAGNOSIS — N390 Urinary tract infection, site not specified: Secondary | ICD-10-CM | POA: Diagnosis not present

## 2019-04-19 DIAGNOSIS — B9689 Other specified bacterial agents as the cause of diseases classified elsewhere: Secondary | ICD-10-CM | POA: Diagnosis not present

## 2019-04-19 DIAGNOSIS — J440 Chronic obstructive pulmonary disease with acute lower respiratory infection: Secondary | ICD-10-CM | POA: Diagnosis not present

## 2019-04-19 DIAGNOSIS — J441 Chronic obstructive pulmonary disease with (acute) exacerbation: Secondary | ICD-10-CM | POA: Diagnosis not present

## 2019-04-19 DIAGNOSIS — A419 Sepsis, unspecified organism: Secondary | ICD-10-CM | POA: Diagnosis not present

## 2019-04-20 ENCOUNTER — Encounter (HOSPITAL_COMMUNITY): Payer: Self-pay

## 2019-04-20 ENCOUNTER — Telehealth (HOSPITAL_COMMUNITY): Payer: Self-pay

## 2019-04-20 ENCOUNTER — Other Ambulatory Visit: Payer: Self-pay

## 2019-04-20 ENCOUNTER — Other Ambulatory Visit (HOSPITAL_COMMUNITY): Payer: Self-pay

## 2019-04-20 ENCOUNTER — Ambulatory Visit (HOSPITAL_COMMUNITY)
Admission: RE | Admit: 2019-04-20 | Discharge: 2019-04-20 | Disposition: A | Discharge status: Home / Self Care | Payer: Medicare Other | Source: Ambulatory Visit | Attending: Internal Medicine | Admitting: Internal Medicine

## 2019-04-20 VITALS — BP 112/59 | HR 88 | Wt 259.0 lb

## 2019-04-20 DIAGNOSIS — Z7951 Long term (current) use of inhaled steroids: Secondary | ICD-10-CM | POA: Insufficient documentation

## 2019-04-20 DIAGNOSIS — E7800 Pure hypercholesterolemia, unspecified: Secondary | ICD-10-CM | POA: Diagnosis not present

## 2019-04-20 DIAGNOSIS — Z79891 Long term (current) use of opiate analgesic: Secondary | ICD-10-CM | POA: Diagnosis not present

## 2019-04-20 DIAGNOSIS — Z9119 Patient's noncompliance with other medical treatment and regimen: Secondary | ICD-10-CM | POA: Insufficient documentation

## 2019-04-20 DIAGNOSIS — J9610 Chronic respiratory failure, unspecified whether with hypoxia or hypercapnia: Secondary | ICD-10-CM

## 2019-04-20 DIAGNOSIS — E875 Hyperkalemia: Secondary | ICD-10-CM | POA: Insufficient documentation

## 2019-04-20 DIAGNOSIS — I2510 Atherosclerotic heart disease of native coronary artery without angina pectoris: Secondary | ICD-10-CM | POA: Diagnosis not present

## 2019-04-20 DIAGNOSIS — E785 Hyperlipidemia, unspecified: Secondary | ICD-10-CM | POA: Insufficient documentation

## 2019-04-20 DIAGNOSIS — R339 Retention of urine, unspecified: Secondary | ICD-10-CM | POA: Diagnosis not present

## 2019-04-20 DIAGNOSIS — I480 Paroxysmal atrial fibrillation: Secondary | ICD-10-CM | POA: Diagnosis not present

## 2019-04-20 DIAGNOSIS — N183 Chronic kidney disease, stage 3 (moderate): Secondary | ICD-10-CM | POA: Diagnosis not present

## 2019-04-20 DIAGNOSIS — I5022 Chronic systolic (congestive) heart failure: Secondary | ICD-10-CM | POA: Insufficient documentation

## 2019-04-20 DIAGNOSIS — Z7901 Long term (current) use of anticoagulants: Secondary | ICD-10-CM | POA: Insufficient documentation

## 2019-04-20 DIAGNOSIS — E1122 Type 2 diabetes mellitus with diabetic chronic kidney disease: Secondary | ICD-10-CM | POA: Insufficient documentation

## 2019-04-20 DIAGNOSIS — Z79899 Other long term (current) drug therapy: Secondary | ICD-10-CM | POA: Diagnosis not present

## 2019-04-20 DIAGNOSIS — Z8673 Personal history of transient ischemic attack (TIA), and cerebral infarction without residual deficits: Secondary | ICD-10-CM | POA: Diagnosis not present

## 2019-04-20 DIAGNOSIS — Z9981 Dependence on supplemental oxygen: Secondary | ICD-10-CM | POA: Diagnosis not present

## 2019-04-20 DIAGNOSIS — M169 Osteoarthritis of hip, unspecified: Secondary | ICD-10-CM | POA: Diagnosis not present

## 2019-04-20 DIAGNOSIS — Z8249 Family history of ischemic heart disease and other diseases of the circulatory system: Secondary | ICD-10-CM | POA: Diagnosis not present

## 2019-04-20 DIAGNOSIS — J449 Chronic obstructive pulmonary disease, unspecified: Secondary | ICD-10-CM | POA: Diagnosis not present

## 2019-04-20 DIAGNOSIS — Z794 Long term (current) use of insulin: Secondary | ICD-10-CM | POA: Diagnosis not present

## 2019-04-20 DIAGNOSIS — I4892 Unspecified atrial flutter: Secondary | ICD-10-CM | POA: Diagnosis not present

## 2019-04-20 DIAGNOSIS — Z87891 Personal history of nicotine dependence: Secondary | ICD-10-CM | POA: Diagnosis not present

## 2019-04-20 DIAGNOSIS — I130 Hypertensive heart and chronic kidney disease with heart failure and stage 1 through stage 4 chronic kidney disease, or unspecified chronic kidney disease: Secondary | ICD-10-CM | POA: Diagnosis not present

## 2019-04-20 DIAGNOSIS — M47816 Spondylosis without myelopathy or radiculopathy, lumbar region: Secondary | ICD-10-CM | POA: Diagnosis not present

## 2019-04-20 DIAGNOSIS — I428 Other cardiomyopathies: Secondary | ICD-10-CM | POA: Insufficient documentation

## 2019-04-20 DIAGNOSIS — G4733 Obstructive sleep apnea (adult) (pediatric): Secondary | ICD-10-CM | POA: Diagnosis not present

## 2019-04-20 DIAGNOSIS — G894 Chronic pain syndrome: Secondary | ICD-10-CM | POA: Diagnosis not present

## 2019-04-20 DIAGNOSIS — M25551 Pain in right hip: Secondary | ICD-10-CM | POA: Diagnosis not present

## 2019-04-20 DIAGNOSIS — M5136 Other intervertebral disc degeneration, lumbar region: Secondary | ICD-10-CM | POA: Diagnosis not present

## 2019-04-20 LAB — CBC
HCT: 34.2 % — ABNORMAL LOW (ref 39.0–52.0)
Hemoglobin: 9.8 g/dL — ABNORMAL LOW (ref 13.0–17.0)
MCH: 25.7 pg — ABNORMAL LOW (ref 26.0–34.0)
MCHC: 28.7 g/dL — ABNORMAL LOW (ref 30.0–36.0)
MCV: 89.5 fL (ref 80.0–100.0)
Platelets: 487 10*3/uL — ABNORMAL HIGH (ref 150–400)
RBC: 3.82 MIL/uL — ABNORMAL LOW (ref 4.22–5.81)
RDW: 22.5 % — ABNORMAL HIGH (ref 11.5–15.5)
WBC: 10.6 10*3/uL — ABNORMAL HIGH (ref 4.0–10.5)
nRBC: 0.3 % — ABNORMAL HIGH (ref 0.0–0.2)

## 2019-04-20 LAB — BASIC METABOLIC PANEL
Anion gap: 10 (ref 5–15)
BUN: 26 mg/dL — ABNORMAL HIGH (ref 8–23)
CO2: 26 mmol/L (ref 22–32)
Calcium: 8.7 mg/dL — ABNORMAL LOW (ref 8.9–10.3)
Chloride: 103 mmol/L (ref 98–111)
Creatinine, Ser: 1.32 mg/dL — ABNORMAL HIGH (ref 0.61–1.24)
GFR calc Af Amer: >60 mL/min (ref >60)
GFR calc non Af Amer: 54 mL/min — ABNORMAL LOW (ref >60)
Glucose, Bld: 176 mg/dL — ABNORMAL HIGH (ref 70–99)
Potassium: 5.9 mmol/L — ABNORMAL HIGH (ref 3.5–5.1)
Sodium: 139 mmol/L (ref 135–145)

## 2019-04-20 MED ORDER — TAMSULOSIN HCL 0.4 MG PO CAPS: 0.4000 mg | ORAL_CAPSULE | Freq: Every day | ORAL | 3 refills | Status: DC

## 2019-04-20 MED ORDER — BISOPROLOL FUMARATE 5 MG PO TABS: 2.5000 mg | ORAL_TABLET | Freq: Every day | ORAL | 3 refills | Status: DC

## 2019-04-20 NOTE — Progress Notes (Signed)
Paramedicine Encounter   Patient ID: [name] , male,   DOB: 1945-12-29,72 y.o.,  MRN: [id number]   Met patient in clinic today with provider.   B/p-112/59 p-88 sp02-100 Weight @ clinic-259 Weight @ home-257-259  Pt here in clinic post hosp admission. He states he is not feeling well since he has been home. He has been trouble getting all his urine out-feels like hes just dripping when he does go.  Feels like his meds are making him feel groggy.  flomax is being added for bedtime. He has trouble getting a close appointment at the [facility] for f/u.  Bisoprolol will be switched to bedtime.  Will do virtual visit next week and then the week after he will come in for in person visit.   Marylouise Stacks, [place] 04/20/2019

## 2019-04-20 NOTE — Patient Instructions (Signed)
Lab work done today. We will notify you of any abnormal lab work. No news is good news!   START Flomax 0.47m every evening after supper.  CHANGE Bisoprolol 2.5 mg every night at bedtime. Please start this tomorrow night 04/21/19.    Please follow up with a virtual visit in 1 week.  Please follow up with an office visit in 2 weeks.  At the [facility] you and your health needs are our priority. As part of our continuing mission to provide you with exceptional heart care, we have created designated Provider Care Teams. These Care Teams include your primary Cardiologist (physician) and Advanced Practice Providers (APPs- Physician Assistants and Nurse Practitioners) who all work together to provide you with the care you need, when you need it.   You may see any of the following providers on your designated Care Team at your next follow up: .Marland KitchenDr DGlori Bickers. Dr DLoralie Champagne. ADarrick Grinder NP

## 2019-04-20 NOTE — Progress Notes (Signed)
PCP: Karna Christmas  Primary Cardiologist: Dr [name].   HPI: Nicholas Caldwell a 73 y.o.malePMH of chronic systolic CHFdue to NICM (EF 40-45%) with moderate RV failure, COPD on 3 L O2, DM, HTN, OHS/OSA on CPAP, PAF s/p DCCV, hyperlipidemia, CAD, and noncompliance. He has multiple admissions over past year due to HF and respiratory failure.   Most recently admitted from 03/07/19-03/31/19 with massive volume overload with severe RV failure, AFL and cardiorenal syndrome. Attempts at diuresis limited by worsening creatinine which peaked at 2.6. Treated with milrinone but still unable to get CVP below 15. Hospital course c/b severe non-compliance with high fluid intake, PAFL, penile wound ,acute versus subacute CVA and Citrobacter UTII. Underwent DC-CV of AFL into NSR.  D/c'd home on 5/28 with weight of 270 and creatinine 1.99. Readmitted 04/09/2019 with recurrent hypoxic/hypercarbic respiratory failure. (ABG 7.27/66/55/86%). Found to have severe hypekalemia (K>7.5) and large left pleural effusion. Weight was up about 10 pounds since d/c. Diuresed with IV lasix and later transitioned to torsemide 80 mg twice a day.Per Dr [name] he was not a candidate for inotropes. Discharge weight on 04/14/2019 was 251 pounds.   Today he returns for post hospital follow up. Overall feeling fair. Having difficulty urinating. Unable to walk in the clinic due to dyspnea and fatigue. SOB with exertion. Using 3 liters oxygen.  Denies PND/Orthopnea. Appetite ok. No fever or chills. Weight at home 257-259 pounds. Taking all medications. Followed by [facility] and HF Paramedicine.   ROS: All systems negative except as listed in HPI, PMH and Problem List.  SH:  Social History   Socioeconomic History  . Marital status: Married    Spouse name: Not on file  . Number of children: Not on file  . Years of education: Not on file  . Highest education level: Not on file  Occupational History  . Not on file  Social Needs  . Financial  resource strain: Not on file  . Food insecurity    Worry: Not on file    Inability: Not on file  . Transportation needs    Medical: Not on file    Non-medical: Not on file  Tobacco Use  . Smoking status: Former Smoker    Packs/day: 1.00    Years: 34.00    Pack years: 34.00    Types: Cigarettes    Quit date: 11/30/2018    Years since quitting: 0.3  . Smokeless tobacco: Never Used  Substance and Sexual Activity  . Alcohol use: No    Alcohol/week: 0.0 standard drinks  . Drug use: No  . Sexual activity: Not on file  Lifestyle  . Physical activity    Days per week: Not on file    Minutes per session: Not on file  . Stress: Not on file  Relationships  . Social Herbalist on phone: Not on file    Gets together: Not on file    Attends religious service: Not on file    Active member of club or organization: Not on file    Attends meetings of clubs or organizations: Not on file    Relationship status: Not on file  . Intimate partner violence    Fear of current or ex partner: Not on file    Emotionally abused: Not on file    Physically abused: Not on file    Forced sexual activity: Not on file  Other Topics Concern  . Not on file  Social History Narrative  . Not on file  FH:  Family History  Problem Relation Age of Onset  . Heart disease Mother   . Hypertension Mother   . Heart failure Mother   . Heart disease Father   . Hypertension Sister   . Heart attack Neg Hx   . Stroke Neg Hx   . Colon cancer Neg Hx   . Esophageal cancer Neg Hx     Past Medical History:  Diagnosis Date  . Atrial flutter ([place])    a. recurrent AFlutter with RVR;  b. Amiodarone Rx started 4/16  . CAD (coronary artery disease)    a. LHC 1/16:  mLAD diffuse disease, pLCx mild disease, dLCx with disease but too small for PCI, RCA ok, EF 25-30%  . Chronic pain   . Chronic systolic CHF (congestive heart failure) ([place])   . COPD (chronic obstructive pulmonary disease) ([place])   . Diabetes  mellitus without complication ([place])   . Hypercholesteremia   . Hypertension   . NICM (nonischemic cardiomyopathy) ([place])    a.dx 2016. b. 2D echo 06/2016 - Last echo 07/01/16: mod dilated LV, mod LVH, EF 25-30%, mild-mod MR, sev LAE, mild-mod reduced RV systolic function, mild-mod TR, PASP 48mHG.  .Marland KitchenPAF (paroxysmal atrial fibrillation) ([facility])    On amio - ot a candidate for flecainide due to cardiomyopathy, not a candidate for Tikosyn due to prolonged QT, and felt to be a poor candidate for ablation given left atrial size.  . Pulmonary hypertension ([place]   . Tobacco abuse     Current Outpatient Medications  Medication Sig Dispense Refill  . albuterol (VENTOLIN HFA) 108 (90 Base) MCG/ACT inhaler Inhale 2 puffs into the lungs every 6 (six) hours as needed for wheezing or shortness of breath.    .Marland Kitchenapixaban (ELIQUIS) 5 MG TABS tablet Take 1 tablet (5 mg total) by mouth 2 (two) times daily. 60 tablet 0  . bisoprolol (ZEBETA) 5 MG tablet Take 0.5 tablets (2.5 mg total) by mouth daily. 30 tablet 0  . budesonide-formoterol (SYMBICORT) 160-4.5 MCG/ACT inhaler Inhale 2 puffs into the lungs 2 (two) times daily.    . ferrous sulfate 325 (65 FE) MG tablet Take 1 tablet (325 mg total) by mouth 2 (two) times daily with a meal. 60 tablet 0  . fluticasone (FLONASE) 50 MCG/ACT nasal spray Place 2 sprays into both nostrils daily as needed for allergies.     .Marland Kitchengabapentin (NEURONTIN) 600 MG tablet Take 1 tablet (600 mg total) by mouth 2 (two) times daily. 60 tablet 0  . guaifenesin (HUMIBID E) 400 MG TABS tablet Take 1 tablet (400 mg total) by mouth every 6 (six) hours as needed. (Patient taking differently: Take 400 mg by mouth every 6 (six) hours as needed (cough). ) 10 tablet 0  . hydrocortisone (ANUSOL-HC) 2.5 % rectal cream Place 1 application rectally 2 (two) times daily as needed for hemorrhoids or itching.     . hydrocortisone 1 % lotion Apply 1 application topically See admin instructions. Apply small amount  to back twice daily for itchy rash    . insulin aspart (NOVOLOG) 100 UNIT/ML injection Inject 4 Units into the skin 3 (three) times daily with meals. 10 mL 0  . insulin aspart protamine - aspart (NOVOLOG MIX 70/30 FLEXPEN) (70-30) 100 UNIT/ML FlexPen Inject 0.1 mLs (10 Units total) into the skin 2 (two) times daily. 15 mL 0  . ipratropium-albuterol (DUONEB) 0.5-2.5 (3) MG/3ML SOLN Take 3 mLs by nebulization 2 (two) times daily as needed (shortness of breath/wheezing).     .Marland Kitchen  isosorbide-hydrALAZINE (BIDIL) 20-37.5 MG tablet Take 0.5 tablets by mouth 3 (three) times daily. 90 tablet 0  . latanoprost (XALATAN) 0.005 % ophthalmic solution Place 1 drop into both eyes at bedtime.    . metolazone (ZAROXOLYN) 2.5 MG tablet Take 1 tablet (2.5 mg total) by mouth every Wednesday. Take extra Potassium 66mq when taking this medication  **Please also take Metolazone 2.5 mg Every Friday and Take extra Potassium 273m when taking this medication.** (Patient taking differently: Take 2.5 mg by mouth every Wednesday. Take extra Potassium 2057mwhen taking this medication  **Please also take Metolazone 2.5 mg Every Friday and Take extra Potassium 24m45mhen taking this medication.**) 30 tablet 0  . OXYGEN Inhale 3 L into the lungs continuous.     . pantoprazole (PROTONIX) 40 MG tablet Take 1 tablet (40 mg total) by mouth 2 (two) times daily. 60 tablet 0  . polyethylene glycol (MIRALAX / GLYCOLAX) packet Take 17 g by mouth daily. (Patient taking differently: Take 17 g by mouth daily as needed (constipation). ) 14 each 0  . Potassium Chloride ER 20 MEQ TBCR Take 40 mEq by mouth 3 (three) times daily. 90 tablet 0  . predniSONE (DELTASONE) 10 MG tablet Take 6 tablets (60 mg total) by mouth daily with breakfast. Take 6 tabs tomorrow and then decrease by 1 tab daily until finished 21 tablet 0  . rosuvastatin (CRESTOR) 20 MG tablet Take 1 tablet (20 mg total) by mouth daily at 6 PM. 30 tablet 0  . sodium chloride (OCEAN) 0.65  % SOLN nasal spray Place 2 sprays into both nostrils as needed for congestion.     . spMarland Kitchenronolactone (ALDACTONE) 25 MG tablet Take 1 tablet (25 mg total) by mouth daily. 30 tablet 0  . torsemide (DEMADEX) 20 MG tablet Take 4 tablets (80 mg total) by mouth 2 (two) times daily. 60 tablet 0   No current facility-administered medications for this encounter.     Vitals:   04/20/19 0936  BP: (!) 112/59  Pulse: 88  SpO2: 100%  Weight: 117.5 kg (259 lb)   Wt Readings from Last 3 Encounters:  04/20/19 117.5 kg (259 lb)  04/14/19 114.3 kg (251 lb 15.8 oz)  04/08/19 117.9 kg (260 lb)     PHYSICAL EXAM: General:  Arrived in a wheel chair.  No resp difficulty HEENT: normal Neck: supple. JVP 6-7. Carotids 2+ bilaterally; no bruits. No lymphadenopathy or thryomegaly appreciated. Cor: PMI normal. Regular rate & rhythm. No rubs, gallops or murmurs. Lungs: clear 3 liters oxygen.  Abdomen: soft, nontender, distended. No hepatosplenomegaly. No bruits or masses. Good bowel sounds. Extremities: no cyanosis, clubbing, rash, edema Neuro: alert & orientedx3, cranial nerves grossly intact. Moves all 4 extremities w/o difficulty. Affect pleasant.   ASSESSMENT & PLAN: 1.Biventricular HF: Nonischemic cardiomyopathy.With prominent RV failure, likely related to COPD/OSA/OHS. TEE in 3/20 with EF 40-45%, severely dilated RV with moderately decreased systolic function. He has had multiple recent admissions with CHF and COPD exacerbations.He has not cooperated with paramedicine or [facility] home health as outpatient, has not been using CPAP per his wife, suspect significant medication noncompliance.   Recently admit he was placed on inotropes but per Dr McLeAundra Dubinld NOT re-attempt inotropic support as it did not help much and he is not candidate for home inotropes.   - NYHA IIIb.-Volume status stable.  - Continue torsemide dose ot 80 mg bid.   - Change  bisoprolol 2.5 at bedtime.  - Continue Bidil 0.5 tab tid   -  He will stay off spironolactone given admission with hyperkalemia.  -Long-term prognosis, unfortunately, will be poor with significant RV dysfunction and history of poor compliance.  2. Anemia:   Last admission had GI workup, colonoscopy 5/8 unremarkable but EGD showed necrotic mucosa gastric fundus/body that is concerning for gastric cancer, biopsies taken which were negative for malignancy.  Followed by GI.  3. Atrial fibrillation/flutter: Patient has history of paroxysmal atrial fibrillation. Now in NSR with PACs. Was seen in past by Dr. Rayann Heman, not thought to be good ablation candidate. He has not been on Tikosyn or amiodarone due to history of long QT.   - Continue Eliquis.  4. COPD: Severe, on 3L home oxygen.  - On home oxygen 3 liters oxygen.   5. OHS/OSA: On home oxygen. Per wife, has not been using CPAP at home.  6. CAD: Nonobstructive on prior cath. No s/s ischemia. He is on Crestor. Off ASA with DOAC and GI bleed  7 Chronic kidney disease stage III: Baseline seems to be 1.3-1.6.  Check BMET 8. . DM2 with poor control Per PCP  9. CVA: 03/2019, MRI head showed both small acute/subacute and old CVAs. Neuro has seen, suspect related to atrial fibrillation.  - He is on Eliquis. 10. Urinary Retention- needs follow up at the [place].  Start 0.4 mg flomax.   Follow up in 1 week with virtual and 2 weeks in the office. Continue HF Paramedicine. I am concerned about his medication compliance. I have asked Katie to try and prepare his pill box. He is at high risk for readmit with multiple co-morbidities. I would like to try and get SW to see for goals of care.     Nicholas Rout NP-C  10:12 AM

## 2019-04-20 NOTE — Telephone Encounter (Signed)
Called pt to review lab results. Left voicemail.

## 2019-04-20 NOTE — Progress Notes (Signed)
Called pt to see if he would be willing to try using pill box but no answer--also noticed he had lab results that needed to be relayed to him ASAP along with another medication that needed to be taken  and clinic not able to reach him by phone and had to leave VM as well. I did a drive by but nobody came to door and didn't answer the phone again. There were a couple cars in the driveway but unsure if that is there vehicle that is used.  Will try again tomor.   Marylouise Stacks, EMT-Paramedic  04/20/19

## 2019-04-21 ENCOUNTER — Telehealth (HOSPITAL_COMMUNITY): Payer: Self-pay

## 2019-04-21 NOTE — Telephone Encounter (Signed)
Finally was able to reach mrs Dolinski and relay the results his lab work yesterday since clinic was unable to reach him and they were not home when I went there yesterday as well---she was advised for him to stop potassium and the clinic has samples of lokelma so I picked that up for him today. pts wife agreed he needed to use pill box so she agreed to let me come out in the morning to assist with the pill box filling and for me to drop off the lokelma.   Marylouise Stacks, EMT-Paramedic 04/21/19

## 2019-04-22 ENCOUNTER — Other Ambulatory Visit (HOSPITAL_COMMUNITY): Payer: Self-pay | Admitting: Cardiology

## 2019-04-22 ENCOUNTER — Other Ambulatory Visit (HOSPITAL_COMMUNITY): Payer: Self-pay

## 2019-04-22 NOTE — Progress Notes (Signed)
Came out today to drop off the lokelma, instructed pts wife on how to mix it up and for him to take it asap when he wakes up.  pt was asleep at this time but the wife sat with me at the table and she got a 30 day pill organizer and we filled up only a week worth right now. Once things stable out with provider visits and med changes the we will fill more slots in the pill box.  Pt finally agreed to pill Chief Executive Officer.   He does not have the flomax yet--it was sent to [facility].  His torsemide was changed to 57m BID when he was in the hosp--I hadnt seen him since his d/c so im not sure exactly what he was taking when he got home--he had 1073mtorsemide tablets and the 2051m Now we will know exactly how meds are placed in pill box and how he is taking them and if he misses any. Wife states he does not miss any.  Pt has not had his meds yet this morning.    KatMarylouise StacksMT-Paramedic  04/22/19

## 2019-04-27 ENCOUNTER — Ambulatory Visit (HOSPITAL_COMMUNITY)
Admission: RE | Admit: 2019-04-27 | Discharge: 2019-04-27 | Disposition: A | Discharge status: Home / Self Care | Payer: Medicare Other | Source: Ambulatory Visit | Attending: Internal Medicine | Admitting: Internal Medicine

## 2019-04-27 ENCOUNTER — Other Ambulatory Visit (HOSPITAL_COMMUNITY): Payer: Self-pay

## 2019-04-27 ENCOUNTER — Other Ambulatory Visit: Payer: Self-pay

## 2019-04-27 VITALS — BP 122/40 | HR 89 | Wt 268.0 lb

## 2019-04-27 DIAGNOSIS — J449 Chronic obstructive pulmonary disease, unspecified: Secondary | ICD-10-CM

## 2019-04-27 DIAGNOSIS — I5022 Chronic systolic (congestive) heart failure: Secondary | ICD-10-CM | POA: Diagnosis not present

## 2019-04-27 DIAGNOSIS — R0600 Dyspnea, unspecified: Secondary | ICD-10-CM

## 2019-04-27 DIAGNOSIS — R339 Retention of urine, unspecified: Secondary | ICD-10-CM

## 2019-04-27 MED ORDER — TORSEMIDE 20 MG PO TABS: 100.0000 mg | ORAL_TABLET | Freq: Two times a day (BID) | ORAL | 0 refills | Status: DC

## 2019-04-27 NOTE — Patient Instructions (Signed)
Stop Spironolactone  Increase Torsemide to 100 mg Twice daily   Telehealth visit on Thur 7/2 at 2:30

## 2019-04-27 NOTE — Progress Notes (Signed)
Heart Failure TeleHealth Note  Due to national recommendations of social distancing due to [place] 19, Audio/video telehealth visit is felt to be most appropriate for this patient at this time.  See MyChart message from today for patient consent regarding telehealth for [facility].  Date:  04/27/2019   ID:  [name], DOB 03-27-1946, MRN [id number]  Location: Home  Provider location: [place] [facility] Type of Visit: Established patient   PCP:  Cl[facility], [facility]  Cardiologist:  No primary care provider on file. Primary HF: Dr [name]   Chief Complaint: Heart Failure  History of Present Illness: Nicholas Caldwell is a 73 y.o. male with a history of of chronic systolic CHFdue to NICM (EF 40-45%)with moderate RV failure, COPD on 3 L O2, DM, HTN, OHS/OSA on CPAP, PAF s/p DCCV, hyperlipidemia, CAD, and noncompliance.He has multiple admissions over past year due to HF and respiratory failure.   Most recently admitted from 03/07/19-03/31/19 with massive volume overload with severe RV failure, AFL and cardiorenal syndrome. Attempts at diuresis limited by worsening creatinine which peaked at 2.6. Treated with milrinone but still unable to get CVP below 15. Hospital course c/b severe non-compliance with high fluid intake, PAFL, penile wound ,acute versus subacute CVA and Citrobacter UTII. Underwent DC-CV of AFL into NSR.  D/c'd home on 5/28 with weight of 270 and creatinine 1.99. Readmitted 04/09/2019 with recurrent hypoxic/hypercarbic respiratory failure. (ABG 7.27/66/55/86%). Found to have severe hypekalemia (K>7.5) and large left pleural effusion. Weight was up about 10 pounds since d/c. Diuresed with IV lasix and later transitioned to torsemide 80 mg twice a day.Per Dr [name] he was not a candidate for inotropes. Discharge weight on 04/14/2019 was 251 pounds.   He  presents via Administrator for a telehealth visit today.  Last visit flomax was started. Potassium was  stopped due to hyperkalemia.  Overall feeling fair. He is essentially bed bound. SOB with exertion. Using 3 liters oxygen continuously. Denies PND/Orthopnea. Appetite ok. No fever or chills. Weight at home trending up to 268 pounds. He has been developing leg edema. Taking all medications. Followed by [facility] and HF Paramedicine.   he denies symptoms worrisome for COVID 19.   Past Medical History:  Diagnosis Date   Atrial flutter ([place])    a. recurrent AFlutter with RVR;  b. Amiodarone Rx started 4/16   CAD (coronary artery disease)    a. LHC 1/16:  mLAD diffuse disease, pLCx mild disease, dLCx with disease but too small for PCI, RCA ok, EF 25-30%   Chronic pain    Chronic systolic CHF (congestive heart failure) (HCC)    COPD (chronic obstructive pulmonary disease) ([place])    Diabetes mellitus without complication ([place])    Hypercholesteremia    Hypertension    NICM (nonischemic cardiomyopathy) ([place])    a.dx 2016. b. 2D echo 06/2016 - Last echo 07/01/16: mod dilated LV, mod LVH, EF 25-30%, mild-mod MR, sev LAE, mild-mod reduced RV systolic function, mild-mod TR, PASP 100mHG.   PAF (paroxysmal atrial fibrillation) (HCC)    On amio - ot a candidate for flecainide due to cardiomyopathy, not a candidate for Tikosyn due to prolonged QT, and felt to be a poor candidate for ablation given left atrial size.   Pulmonary hypertension ([place]    Tobacco abuse    Past Surgical History:  Procedure Laterality Date   BIOPSY  03/11/2019   Procedure: BIOPSY;  Surgeon: CLavena Bullion DO;  Location[place]  Service:  Gastroenterology;;   BIOPSY  03/15/2019   Procedure: BIOPSY;  Surgeon: Irving Copas., MD;  Location: [place];  Service: Gastroenterology;;   CARDIOVERSION N/A 07/30/2018   Procedure: CARDIOVERSION;  Surgeon: Larey Dresser, MD;  Location: [facility] ENDOSCOPY;  Service: Cardiovascular;  Laterality: N/A;   COLONOSCOPY N/A 03/11/2019   Procedure: COLONOSCOPY;  Surgeon:  Lavena Bullion, DO;  Location: [place];  Service: Gastroenterology;  Laterality: N/A;   ENTEROSCOPY N/A 03/15/2019   Procedure: ENTEROSCOPY;  Surgeon: Rush Landmark Telford Nab., MD;  Location: [place];  Service: Gastroenterology;  Laterality: N/A;   ESOPHAGOGASTRODUODENOSCOPY Left 03/11/2019   Procedure: ESOPHAGOGASTRODUODENOSCOPY (EGD);  Surgeon: Lavena Bullion, DO;  Location: [facility] ENDOSCOPY;  Service: Gastroenterology;  Laterality: Left;   LEFT AND RIGHT HEART CATHETERIZATION WITH CORONARY ANGIOGRAM N/A 11/06/2014   Procedure: LEFT AND RIGHT HEART CATHETERIZATION WITH CORONARY ANGIOGRAM;  Surgeon: Jettie Booze, MD;  Location: [facility] CATH LAB;  Service: Cardiovascular;  Laterality: N/A;   RIGHT/LEFT HEART CATH AND CORONARY ANGIOGRAPHY N/A 12/06/2018   Procedure: RIGHT/LEFT HEART CATH AND CORONARY ANGIOGRAPHY;  Surgeon: Larey Dresser, MD;  Location: [place] CV LAB;  Service: Cardiovascular;  Laterality: N/A;   SPLENECTOMY     SUBMUCOSAL TATTOO INJECTION  03/15/2019   Procedure: SUBMUCOSAL TATTOO INJECTION;  Surgeon: Irving Copas., MD;  Location: [place];  Service: Gastroenterology;;     Current Outpatient Medications  Medication Sig Dispense Refill   albuterol (VENTOLIN HFA) 108 (90 Base) MCG/ACT inhaler Inhale 2 puffs into the lungs every 6 (six) hours as needed for wheezing or shortness of breath.     apixaban (ELIQUIS) 5 MG TABS tablet Take 1 tablet (5 mg total) by mouth 2 (two) times daily. 60 tablet 0   bisoprolol (ZEBETA) 5 MG tablet Take 0.5 tablets (2.5 mg total) by mouth at bedtime. 30 tablet 3   budesonide-formoterol (SYMBICORT) 160-4.5 MCG/ACT inhaler Inhale 2 puffs into the lungs 2 (two) times daily.     ferrous sulfate 325 (65 FE) MG tablet Take 1 tablet (325 mg total) by mouth 2 (two) times daily with a meal. 60 tablet 0   fluticasone (FLONASE) 50 MCG/ACT nasal spray Place 2 sprays into both nostrils daily as needed for allergies.       gabapentin (NEURONTIN) 600 MG tablet Take 1 tablet (600 mg total) by mouth 2 (two) times daily. 60 tablet 0   guaifenesin (HUMIBID E) 400 MG TABS tablet Take 1 tablet (400 mg total) by mouth every 6 (six) hours as needed. (Patient not taking: Reported on 04/22/2019) 10 tablet 0   hydrocortisone (ANUSOL-HC) 2.5 % rectal cream Place 1 application rectally 2 (two) times daily as needed for hemorrhoids or itching.      hydrocortisone 1 % lotion Apply 1 application topically See admin instructions. Apply small amount to back twice daily for itchy rash     insulin aspart (NOVOLOG) 100 UNIT/ML injection Inject 4 Units into the skin 3 (three) times daily with meals. 10 mL 0   insulin aspart protamine - aspart (NOVOLOG MIX 70/30 FLEXPEN) (70-30) 100 UNIT/ML FlexPen Inject 0.1 mLs (10 Units total) into the skin 2 (two) times daily. 15 mL 0   ipratropium-albuterol (DUONEB) 0.5-2.5 (3) MG/3ML SOLN Take 3 mLs by nebulization 2 (two) times daily as needed (shortness of breath/wheezing).      isosorbide-hydrALAZINE (BIDIL) 20-37.5 MG tablet Take 0.5 tablets by mouth 3 (three) times daily. 90 tablet 0   latanoprost (XALATAN) 0.005 % ophthalmic solution Place 1 drop  into both eyes at bedtime.     metolazone (ZAROXOLYN) 2.5 MG tablet Take 1 tablet (2.5 mg total) by mouth every Wednesday. Take extra Potassium 69mq when taking this medication  **Please also take Metolazone 2.5 mg Every Friday and Take extra Potassium 228m when taking this medication.** (Patient taking differently: Take 2.5 mg by mouth every Wednesday. Take extra Potassium 2081mwhen taking this medication  **Please also take Metolazone 2.5 mg Every Friday and Take extra Potassium 50m35mhen taking this medication.**) 30 tablet 0   OXYGEN Inhale 3 L into the lungs continuous.      pantoprazole (PROTONIX) 40 MG tablet Take 1 tablet (40 mg total) by mouth 2 (two) times daily. 60 tablet 0   polyethylene glycol (MIRALAX / GLYCOLAX) packet Take  17 g by mouth daily. (Patient taking differently: Take 17 g by mouth daily as needed (constipation). ) 14 each 0   Potassium Chloride ER 20 MEQ TBCR Take 40 mEq by mouth 3 (three) times daily. (Patient not taking: Reported on 04/22/2019) 90 tablet 0   predniSONE (DELTASONE) 10 MG tablet Take 6 tablets (60 mg total) by mouth daily with breakfast. Take 6 tabs tomorrow and then decrease by 1 tab daily until finished (Patient not taking: Reported on 04/22/2019) 21 tablet 0   rosuvastatin (CRESTOR) 20 MG tablet Take 1 tablet (20 mg total) by mouth daily at 6 PM. 30 tablet 0   sodium chloride (OCEAN) 0.65 % SOLN nasal spray Place 2 sprays into both nostrils as needed for congestion.      spironolactone (ALDACTONE) 25 MG tablet Take 1 tablet (25 mg total) by mouth daily. 30 tablet 0   tamsulosin (FLOMAX) 0.4 MG CAPS capsule Take 1 capsule (0.4 mg total) by mouth daily after supper. (Patient not taking: Reported on 04/22/2019) 30 capsule 3   torsemide (DEMADEX) 20 MG tablet Take 4 tablets (80 mg total) by mouth 2 (two) times daily. 60 tablet 0   No current facility-administered medications for this encounter.     Allergies:   Patient has no known allergies.   Social History:  The patient  reports that he quit smoking about 4 months ago. His smoking use included cigarettes. He has a 34.00 pack-year smoking history. He has never used smokeless tobacco. He reports that he does not drink alcohol or use drugs.   Family History:  The patient's family history includes Heart disease in his father and mother; Heart failure in his mother; Hypertension in his mother and sister.   ROS:  Please see the history of present illness.   All other systems are personally reviewed and negative.  Vitals:   04/27/19 1513  BP: (!) 122/40  Pulse: 89  SpO2: (!) 89%    Exam:  Video Health Call; Exam is visual. General:  Speaks in full sentences. No resp difficulty. Lungs: Wheezing on the left on 3 liters   Abdomen:  Non-distended per patient report Extremities: R and L leg 1-2 edema  Neuro: Alert & oriented x 3.   Recent Labs: 04/09/2019: ALT 25; B Natriuretic Peptide 1,843.4 04/10/2019: TSH 0.587 04/14/2019: Magnesium 1.7 04/20/2019: BUN 26; Creatinine, Ser 1.32; Hemoglobin 9.8; Platelets 487; Potassium 5.9; Sodium 139  Personally reviewed   Wt Readings from Last 3 Encounters:  04/27/19 121.6 kg (268 lb)  04/20/19 117.5 kg (259 lb)  04/14/19 114.3 kg (251 lb 15.8 oz)      ASSESSMENT AND PLAN: 1.Biventricular HF: Nonischemic cardiomyopathy.With prominent RV failure, likely related to COPD/OSA/OHS. TEE  in 3/20 with EF 40-45%, severely dilated RV with moderately decreased systolic function. He has had multiple recent admissions with CHF and COPD exacerbations.He has not cooperated with paramedicine or [facility] home health as outpatient, has not been using CPAP per his wife, suspect significant medication noncompliance.   Recently admit he was placed on inotropes but per Dr [name] would NOT re-attempt inotropic support as it did not help much and he is not candidate for home inotropes.  - NYHA IIIb.-Volume status trending up.  - Increase torsemide to 100 mg twice a day and metolazone twice a week.   - Change  bisoprolol 2.5 at bedtime.  - Continue Bidil 0.5 tab tid  - Stop spironolactone with hyyperkalemia.  -Long-term prognosis, unfortunately, will be poor with significant RV dysfunction and history of poor compliance.  2. Anemia:  Last admission had GI workup, colonoscopy 5/8 unremarkable but EGD showed necrotic mucosa gastric fundus/body that is concerning for gastric cancer, biopsies taken which were negative for malignancy.  Followed by GI.  3. Atrial fibrillation/flutter: Patient has history of paroxysmal atrial fibrillation. Now in NSR with PACs. Was seen in past by Dr. Rayann Heman, not thought to be good ablation candidate. He has not been on Tikosyn or amiodarone due to history of long QT.  -  Continue Eliquis. No bleeding issues.  4. COPD: Severe, on 3L home oxygen.  - On home oxygen 3 liters oxygen.   5. OHS/OSA: On home oxygen. Per wife, has not been using CPAP at home.  6. CAD: Nonobstructive on prior cath. No s/s ischemia. He is on Crestor. Off ASA with DOAC and GI bleed  7 Chronic kidney disease stage III: Baseline seems to be 1.3-1.6.  Check BMET 8. . DM2 with poor control Per PCP  9. CVA: 03/2019, MRI head showed both small acute/subacute and old CVAs. Neuro has seen, suspect related to atrial fibrillation.  - He is on Eliquis. 10. Urinary Retention- needs follow up at the [place].  Flomax was added. He is waiting on [place] to send flomax.     COVID screen The patient does not have any symptoms that suggest any further testing/ screening at this time.  Social distancing reinforced today.  Patient Risk: After full review of this patients clinical status, I feel that they are at moderate risk for cardiac decompensation at this time.  Relevant cardiac medications were reviewed at length with the patient today. The patient does not have concerns regarding their medications at this time.   The following changes were made today: Verifying potassium and spironolactone  Recommended follow-up:  Next week with telehealth visit. He remains tenuous and will likely need Hospice soon. I briefly discussed today.   Today, I have spent 15 minutes with the patient with telehealth technology discussing the above issues .    Jeanmarie Hubert, NP  04/27/2019 3:12 PM  [place] [address] [facility] [place] [address] [phone] (office) [phone] (fax)

## 2019-04-27 NOTE — Progress Notes (Signed)
Paramedicine Encounter    Patient ID: Nicholas Caldwell, male    DOB: 1946-07-11, 73 y.o.   MRN: [id number]   Patient Care Team: [facility] as PCP - General Jettie Booze, MD as Consulting Physician (Cardiology) Sharmon Revere as Physician Assistant (Cardiology) [name], MD as Attending Physician (Internal Medicine) Thompson Grayer, MD as Consulting Physician (Clinical Cardiac Electrophysiology)  Patient Active Problem List   Diagnosis Date Noted  . Lobar pneumonia ([place]) 04/14/2019  . Hyperkalemia 04/10/2019  . Cerebral embolism with cerebral infarction 03/21/2019  . Gastritis and gastroduodenitis   . NSVT (nonsustained ventricular tachycardia) ([place]) 03/08/2019  . Tobacco abuse counseling 03/08/2019  . Iron deficiency anemia   . Acute on chronic systolic CHF (congestive heart failure) ([place]) 03/06/2019  . Diabetes mellitus type 2 in obese ([place])   . Primary osteoarthritis of right hip   . Hypomagnesemia   . Steroid-induced hyperglycemia   . Supplemental oxygen dependent   . Leukocytosis   . Hypokalemia   . Acute on chronic anemia   . Diabetic peripheral neuropathy ([place])   . Acute on chronic systolic (congestive) heart failure ([place])   . Acute on chronic respiratory failure ([place]) 01/14/2019  . Hypoxia   . Altered mental status   . Heart failure ([place]) 12/30/2018  . AKI (acute kidney injury) ([place])   . Acute respiratory failure ([place]) 11/22/2018  . Acute on chronic respiratory failure with hypoxia and hypercapnia ([place]) 11/13/2018  . Metabolic encephalopathy 67/59/1638  . Acute respiratory failure with hypoxia and hypercapnia ([place]) 07/26/2018  . Acute exacerbation of CHF (congestive heart failure) ([place]) 07/26/2018  . CHF exacerbation ([place]) 05/01/2018  . CHF (congestive heart failure) ([place]) 04/30/2018  . Chronic respiratory failure with hypoxia ([place]) 12/28/2017  . Acute encephalopathy   . Atrial fibrillation with RVR ([place])   . SVT (supraventricular  tachycardia) ([place])   . COPD GOLD 0   . Medically noncompliant   . Panlobular emphysema ([place])   . OSA (obstructive sleep apnea)   . Pulmonary hypertension ([place])   . Disorientation   . Pressure injury of skin 09/07/2016  . Acute on chronic combined systolic and diastolic CHF (congestive heart failure) ([place]) 09/05/2016  . Skin lesion-left heal 09/05/2016  . Chronic systolic CHF (congestive heart failure) ([place]) 07/16/2016  . COPD (chronic obstructive pulmonary disease) ([place]) 07/16/2016  . Uncontrolled type 2 diabetes mellitus with complication ([place])   . Diabetic polyneuropathy associated with diabetes mellitus due to underlying condition ([place])   . Normocytic anemia 06/29/2016  . CAD - Non-obstructive by LHC1/16 01/21/2016  . Nonischemic cardiomyopathy ([place]) 10/09/2015  . PAF (paroxysmal atrial fibrillation) ([place])   . Chronic pain 02/19/2015  . Morbid obesity ([place]) 02/13/2015  . Dyspnea   . Elevated troponin I level 11/01/2014  . COPD exacerbation ([place]) 11/01/2014  . Essential hypertension 10/31/2014  . Type 2 diabetes mellitus with neuropathy 10/31/2014  . Hyperlipidemia  10/31/2014  . Cigarette smoker 10/31/2014    Current Outpatient Medications:  .  albuterol (VENTOLIN HFA) 108 (90 Base) MCG/ACT inhaler, Inhale 2 puffs into the lungs every 6 (six) hours as needed for wheezing or shortness of breath., Disp: , Rfl:  .  apixaban (ELIQUIS) 5 MG TABS tablet, Take 1 tablet (5 mg total) by mouth 2 (two) times daily., Disp: 60 tablet, Rfl: 0 .  bisoprolol (ZEBETA) 5 MG tablet, Take 0.5 tablets (2.5 mg total) by mouth at bedtime., Disp: 30 tablet, Rfl: 3 .  budesonide-formoterol (SYMBICORT) 160-4.5 MCG/ACT inhaler,  Inhale 2 puffs into the lungs 2 (two) times daily., Disp: , Rfl:  .  ferrous sulfate 325 (65 FE) MG tablet, Take 1 tablet (325 mg total) by mouth 2 (two) times daily with a meal., Disp: 60 tablet, Rfl: 0 .  fluticasone (FLONASE) 50 MCG/ACT nasal spray, Place 2 sprays into both  nostrils daily as needed for allergies. , Disp: , Rfl:  .  gabapentin (NEURONTIN) 600 MG tablet, Take 1 tablet (600 mg total) by mouth 2 (two) times daily., Disp: 60 tablet, Rfl: 0 .  guaifenesin (HUMIBID E) 400 MG TABS tablet, Take 1 tablet (400 mg total) by mouth every 6 (six) hours as needed., Disp: 10 tablet, Rfl: 0 .  hydrocortisone (ANUSOL-HC) 2.5 % rectal cream, Place 1 application rectally 2 (two) times daily as needed for hemorrhoids or itching. , Disp: , Rfl:  .  hydrocortisone 1 % lotion, Apply 1 application topically See admin instructions. Apply small amount to back twice daily for itchy rash, Disp: , Rfl:  .  insulin aspart (NOVOLOG) 100 UNIT/ML injection, Inject 4 Units into the skin 3 (three) times daily with meals., Disp: 10 mL, Rfl: 0 .  insulin aspart protamine - aspart (NOVOLOG MIX 70/30 FLEXPEN) (70-30) 100 UNIT/ML FlexPen, Inject 0.1 mLs (10 Units total) into the skin 2 (two) times daily., Disp: 15 mL, Rfl: 0 .  ipratropium-albuterol (DUONEB) 0.5-2.5 (3) MG/3ML SOLN, Take 3 mLs by nebulization 2 (two) times daily as needed (shortness of breath/wheezing). , Disp: , Rfl:  .  isosorbide-hydrALAZINE (BIDIL) 20-37.5 MG tablet, Take 0.5 tablets by mouth 3 (three) times daily., Disp: 90 tablet, Rfl: 0 .  latanoprost (XALATAN) 0.005 % ophthalmic solution, Place 1 drop into both eyes at bedtime., Disp: , Rfl:  .  metolazone (ZAROXOLYN) 2.5 MG tablet, Take 1 tablet (2.5 mg total) by mouth every Wednesday. Take extra Potassium 39mq when taking this medication  **Please also take Metolazone 2.5 mg Every Friday and Take extra Potassium 265m when taking this medication.** (Patient taking differently: Take 2.5 mg by mouth every Wednesday. Take extra Potassium 208mwhen taking this medication  **Please also take Metolazone 2.5 mg Every Friday and Take extra Potassium 18m79mhen taking this medication.**), Disp: 30 tablet, Rfl: 0 .  OXYGEN, Inhale 3 L into the lungs continuous. , Disp: , Rfl:  .   pantoprazole (PROTONIX) 40 MG tablet, Take 1 tablet (40 mg total) by mouth 2 (two) times daily., Disp: 60 tablet, Rfl: 0 .  polyethylene glycol (MIRALAX / GLYCOLAX) packet, Take 17 g by mouth daily. (Patient taking differently: Take 17 g by mouth daily as needed (constipation). ), Disp: 14 each, Rfl: 0 .  rosuvastatin (CRESTOR) 20 MG tablet, Take 1 tablet (20 mg total) by mouth daily at 6 PM., Disp: 30 tablet, Rfl: 0 .  sodium chloride (OCEAN) 0.65 % SOLN nasal spray, Place 2 sprays into both nostrils as needed for congestion. , Disp: , Rfl:  .  tamsulosin (FLOMAX) 0.4 MG CAPS capsule, Take 1 capsule (0.4 mg total) by mouth daily after supper., Disp: 30 capsule, Rfl: 3 .  predniSONE (DELTASONE) 10 MG tablet, Take 6 tablets (60 mg total) by mouth daily with breakfast. Take 6 tabs tomorrow and then decrease by 1 tab daily until finished (Patient not taking: Reported on 04/27/2019), Disp: 21 tablet, Rfl: 0 .  torsemide (DEMADEX) 20 MG tablet, Take 5 tablets (100 mg total) by mouth 2 (two) times daily., Disp: 60 tablet, Rfl: 0 No Known Allergies  Social History   Socioeconomic History  . Marital status: Married    Spouse name: Not on file  . Number of children: Not on file  . Years of education: Not on file  . Highest education level: Not on file  Occupational History  . Not on file  Social Needs  . Financial resource strain: Not on file  . Food insecurity    Worry: Not on file    Inability: Not on file  . Transportation needs    Medical: Not on file    Non-medical: Not on file  Tobacco Use  . Smoking status: Former Smoker    Packs/day: 1.00    Years: 34.00    Pack years: 34.00    Types: Cigarettes    Quit date: 11/30/2018    Years since quitting: 0.4  . Smokeless tobacco: Never Used  Substance and Sexual Activity  . Alcohol use: No    Alcohol/week: 0.0 standard drinks  . Drug use: No  . Sexual activity: Not on file  Lifestyle  . Physical activity    Days per week: Not on  file    Minutes per session: Not on file  . Stress: Not on file  Relationships  . Social Herbalist on phone: Not on file    Gets together: Not on file    Attends religious service: Not on file    Active member of club or organization: Not on file    Attends meetings of clubs or organizations: Not on file    Relationship status: Not on file  . Intimate partner violence    Fear of current or ex partner: Not on file    Emotionally abused: Not on file    Physically abused: Not on file    Forced sexual activity: Not on file  Other Topics Concern  . Not on file  Social History Narrative  . Not on file    Physical Exam      Future Appointments  Date Time Provider [place]  05/05/2019  2:30 PM [facility] PA/NP [facility] None    BP (!) 122/40   Pulse 82   Temp 97.9 F (36.6 C)   Resp (!) 22   Wt 268 lb (121.6 kg)   SpO2 (!) 85%   BMI 36.35 kg/m  His SP02 increased to 91% and that is as high as it went. He does seem more sob than he did last week, he gets sob with short distances to his bathroom and around the house.  His weight is increased, he does have some edema to his legs. Virtual visit with amy today, she is increasing his torsemide to 162m BID and stopping the spiro.  [facility] will be ordered for blood work this week, next week visit changed to virtual visit.  He reports that his CPAP is not working properly--will ask for jennas help on this matter.   Weight yesterday-266 lb Last visit weight-259 @ clinic    KMarylouise Stacks EPlantersvilleParamedic  04/28/19

## 2019-04-27 NOTE — Progress Notes (Signed)
Orders per Darrick Grinder, NP:  Stop Arlyce Harman  Increase Torsemide to 100 mg BID  BMET with [facility] next day or 2  telehealth visit in 1 week.  Med list updated, appt sch for 7/2, spoke w/Katie and she is aware of med changes and appt, she will adjust pill box for pt.  Order for bmet placed and sent to [facility]

## 2019-04-28 ENCOUNTER — Telehealth (HOSPITAL_COMMUNITY): Payer: Self-pay | Admitting: Licensed Clinical Social Worker

## 2019-04-28 NOTE — Telephone Encounter (Signed)
CSW informed by [employer] that pt is having current concerns with his CPAP- states it is not working as normal and is not blowing air continuously.  CSW investigated who provided CPAP and able to determine that CPAP was from the [place] through their [facility] [phone] ext [phone]).  CSW discussed concerns with clinic staff.  CPAP is able to be monitored through their clinic remotely and per clinic rep patient has had the CPAP 98 days and has only used it for a total of 58 minutes.  According to what they can see there are no concerns with the CPAP but state they need more of a baseline to determine if anything is wrong- suggest having the patient wear the CPAP at least 4 hours a night for several days to see if there are any concerns.  CSW called and was able to speak with patient [facility]- wife expressed understanding and states they will try to have him wear it consistently for several nights so the clinic can evaluate further.  CSW will continue to follow and assist as needed  Jorge Ny, [facility] Desk#: [phone] Cell#: [phone]

## 2019-05-03 DIAGNOSIS — J441 Chronic obstructive pulmonary disease with (acute) exacerbation: Secondary | ICD-10-CM | POA: Diagnosis not present

## 2019-05-03 DIAGNOSIS — E1140 Type 2 diabetes mellitus with diabetic neuropathy, unspecified: Secondary | ICD-10-CM | POA: Diagnosis not present

## 2019-05-03 DIAGNOSIS — J440 Chronic obstructive pulmonary disease with acute lower respiratory infection: Secondary | ICD-10-CM | POA: Diagnosis not present

## 2019-05-03 DIAGNOSIS — Z794 Long term (current) use of insulin: Secondary | ICD-10-CM | POA: Diagnosis not present

## 2019-05-03 DIAGNOSIS — F17210 Nicotine dependence, cigarettes, uncomplicated: Secondary | ICD-10-CM | POA: Diagnosis not present

## 2019-05-03 DIAGNOSIS — I110 Hypertensive heart disease with heart failure: Secondary | ICD-10-CM | POA: Diagnosis not present

## 2019-05-03 DIAGNOSIS — A419 Sepsis, unspecified organism: Secondary | ICD-10-CM | POA: Diagnosis not present

## 2019-05-03 DIAGNOSIS — B9689 Other specified bacterial agents as the cause of diseases classified elsewhere: Secondary | ICD-10-CM | POA: Diagnosis not present

## 2019-05-03 DIAGNOSIS — I2510 Atherosclerotic heart disease of native coronary artery without angina pectoris: Secondary | ICD-10-CM | POA: Diagnosis not present

## 2019-05-03 DIAGNOSIS — Z7901 Long term (current) use of anticoagulants: Secondary | ICD-10-CM | POA: Diagnosis not present

## 2019-05-03 DIAGNOSIS — J189 Pneumonia, unspecified organism: Secondary | ICD-10-CM | POA: Diagnosis not present

## 2019-05-03 DIAGNOSIS — Z9981 Dependence on supplemental oxygen: Secondary | ICD-10-CM | POA: Diagnosis not present

## 2019-05-03 DIAGNOSIS — N390 Urinary tract infection, site not specified: Secondary | ICD-10-CM | POA: Diagnosis not present

## 2019-05-03 DIAGNOSIS — I5023 Acute on chronic systolic (congestive) heart failure: Secondary | ICD-10-CM | POA: Diagnosis not present

## 2019-05-05 ENCOUNTER — Encounter (HOSPITAL_COMMUNITY): Payer: Self-pay

## 2019-05-05 ENCOUNTER — Telehealth (HOSPITAL_COMMUNITY): Payer: Self-pay

## 2019-05-05 ENCOUNTER — Ambulatory Visit (HOSPITAL_COMMUNITY)
Admission: RE | Admit: 2019-05-05 | Discharge: 2019-05-05 | Disposition: A | Discharge status: Home / Self Care | Payer: Medicare Other | Source: Ambulatory Visit | Attending: Internal Medicine | Admitting: Internal Medicine

## 2019-05-05 ENCOUNTER — Other Ambulatory Visit (HOSPITAL_COMMUNITY): Payer: Self-pay

## 2019-05-05 ENCOUNTER — Other Ambulatory Visit: Payer: Self-pay

## 2019-05-05 VITALS — BP 112/52 | HR 102 | Wt 268.0 lb

## 2019-05-05 DIAGNOSIS — I5022 Chronic systolic (congestive) heart failure: Secondary | ICD-10-CM

## 2019-05-05 DIAGNOSIS — R0600 Dyspnea, unspecified: Secondary | ICD-10-CM

## 2019-05-05 DIAGNOSIS — J9610 Chronic respiratory failure, unspecified whether with hypoxia or hypercapnia: Secondary | ICD-10-CM

## 2019-05-05 DIAGNOSIS — I480 Paroxysmal atrial fibrillation: Secondary | ICD-10-CM

## 2019-05-05 MED ORDER — ROSUVASTATIN CALCIUM 20 MG PO TABS: 20.0000 mg | ORAL_TABLET | Freq: Every day | ORAL | 3 refills | Status: DC

## 2019-05-05 MED ORDER — PANTOPRAZOLE SODIUM 40 MG PO TBEC: 40.0000 mg | DELAYED_RELEASE_TABLET | Freq: Two times a day (BID) | ORAL | 0 refills | Status: DC

## 2019-05-05 MED ORDER — ROSUVASTATIN CALCIUM 20 MG PO TABS: 20.0000 mg | ORAL_TABLET | Freq: Every day | ORAL | 0 refills | Status: DC

## 2019-05-05 MED ORDER — METOLAZONE 2.5 MG PO TABS: 2.5000 mg | ORAL_TABLET | ORAL | 0 refills | Status: DC

## 2019-05-05 NOTE — Progress Notes (Signed)
Heart Failure TeleHealth Note  Due to national recommendations of social distancing due to [place] 19, Audio/video telehealth visit is felt to be most appropriate for this patient at this time.  See MyChart message from today for patient consent regarding telehealth for [facility].  Date:  05/05/2019   ID:  Nicholas Caldwell, DOB 05/20/1946, MRN [id number]  Location: Home  Provider location: [facility] Type of Visit: Established patient  PCP:  [facility]  Cardiologist:  No primary care provider on file. Primary HF: Dr [name]   Chief Complaint: Heart Failure   History of Present Illness: Nicholas Caldwell is a 73 y.o. male with a history of chronic systolic CHFdue to NICM (EF 40-45%)with moderate RV failure, COPD on 3 L O2, DM, HTN, OHS/OSA on CPAP, PAF s/p DCCV, hyperlipidemia, CAD, and noncompliance.He has multiple admissions over past year due to HF and respiratory failure.   Most recently admitted from 03/07/19-03/31/19 with massive volume overload with severe RV failure, AFL and cardiorenal syndrome. Attempts at diuresis limited by worsening creatinine which peaked at 2.6. Treated with milrinone but still unable to get CVP below 15. Hospital course c/b severe non-compliance with high fluid intake, PAFL, penile wound ,acute versus subacute CVA and Citrobacter UTII. Underwent DC-CV of AFL into NSR.  D/c'd home on 5/28 with weight of 270 and creatinine 1.99. Readmitted6/6/2020with recurrent hypoxic/hypercarbic respiratory failure. (ABG 7.27/66/55/86%). Found to have severe hypekalemia (K>7.5) and large left pleural effusion. Weightwasup about 10 pounds since d/c.Diuresed with IV lasix and later transitioned to torsemide 80 mg twice a day.Per Dr [name] he was not a candidate for inotropes. Discharge weight on 04/14/2019 was 251 pounds.   He presents via Administrator for a telehealth visit today.  Overall feeling terrible. Complaining of SOB with  exertion. + orthopnea. Denies PND. Appetite ok. No fever or chills. Increased  Leg edema. Weight at home 269 pounds. Taking all medications. Followed by [facility].   he denies symptoms worrisome for COVID 19.   Past Medical History:  Diagnosis Date  . Atrial flutter ([place])    a. recurrent AFlutter with RVR;  b. Amiodarone Rx started 4/16  . CAD (coronary artery disease)    a. LHC 1/16:  mLAD diffuse disease, pLCx mild disease, dLCx with disease but too small for PCI, RCA ok, EF 25-30%  . Chronic pain   . Chronic systolic CHF (congestive heart failure) ([place])   . COPD (chronic obstructive pulmonary disease) ([place])   . Diabetes mellitus without complication ([place])   . Hypercholesteremia   . Hypertension   . NICM (nonischemic cardiomyopathy) ([place])    a.dx 2016. b. 2D echo 06/2016 - Last echo 07/01/16: mod dilated LV, mod LVH, EF 25-30%, mild-mod MR, sev LAE, mild-mod reduced RV systolic function, mild-mod TR, PASP 22mHG.  .Marland KitchenPAF (paroxysmal atrial fibrillation) (HCC)    On amio - ot a candidate for flecainide due to cardiomyopathy, not a candidate for Tikosyn due to prolonged QT, and felt to be a poor candidate for ablation given left atrial size.  . Pulmonary hypertension ([place]   . Tobacco abuse    Past Surgical History:  Procedure Laterality Date  . BIOPSY  03/11/2019   Procedure: BIOPSY;  Surgeon: CLavena Bullion DO;  [facility]  Service: Gastroenterology;;  . BIOPSY  03/15/2019   Procedure: BIOPSY;  Surgeon: MIrving Copas, MD;  [facility]  Service: Gastroenterology;;  . CARDIOVERSION N/A 07/30/2018   Procedure: CARDIOVERSION;  Surgeon: Larey Dresser, MD;  Location: [facility] ENDOSCOPY;  Service: Cardiovascular;  Laterality: N/A;  . COLONOSCOPY N/A 03/11/2019   Procedure: COLONOSCOPY;  Surgeon: Lavena Bullion, DO;  Location: [facility];  Service: Gastroenterology;  Laterality: N/A;  . ENTEROSCOPY N/A 03/15/2019   Procedure: ENTEROSCOPY;  Surgeon: Rush Landmark  Telford Nab., MD;  Location: [facility];  Service: Gastroenterology;  Laterality: N/A;  . ESOPHAGOGASTRODUODENOSCOPY Left 03/11/2019   Procedure: ESOPHAGOGASTRODUODENOSCOPY (EGD);  Surgeon: Lavena Bullion, DO;  Location[facility] ENDOSCOPY;  Service: Gastroenterology;  Laterality: Left;  . LEFT AND RIGHT HEART CATHETERIZATION WITH CORONARY ANGIOGRAM N/A 11/06/2014   Procedure: LEFT AND RIGHT HEART CATHETERIZATION WITH CORONARY ANGIOGRAM;  Surgeon: Jettie Booze, MD;  [facility] C[facility] LAB;  Service: Cardiovascular;  Laterality: N/A;  . RIGHT/LEFT HEART CATH AND CORONARY ANGIOGRAPHY N/A 12/06/2018   Procedure: RIGHT/LEFT HEART CATH AND CORONARY ANGIOGRAPHY;  Surgeon: Larey Dresser, MD;  Location: [facility] CV LAB;  Service: Cardiovascular;  Laterality: N/A;  . SPLENECTOMY    . SUBMUCOSAL TATTOO INJECTION  03/15/2019   Procedure: SUBMUCOSAL TATTOO INJECTION;  Surgeon: Rush Landmark Telford Nab., MD;  Location: [place];  Service: Gastroenterology;;     Current Outpatient Medications  Medication Sig Dispense Refill  . albuterol (VENTOLIN HFA) 108 (90 Base) MCG/ACT inhaler Inhale 2 puffs into the lungs every 6 (six) hours as needed for wheezing or shortness of breath.    Marland Kitchen apixaban (ELIQUIS) 5 MG TABS tablet Take 1 tablet (5 mg total) by mouth 2 (two) times daily. 60 tablet 0  . bisoprolol (ZEBETA) 5 MG tablet Take 0.5 tablets (2.5 mg total) by mouth at bedtime. 30 tablet 3  . budesonide-formoterol (SYMBICORT) 160-4.5 MCG/ACT inhaler Inhale 2 puffs into the lungs 2 (two) times daily.    . ferrous sulfate 325 (65 FE) MG tablet Take 1 tablet (325 mg total) by mouth 2 (two) times daily with a meal. 60 tablet 0  . fluticasone (FLONASE) 50 MCG/ACT nasal spray Place 2 sprays into both nostrils daily as needed for allergies.     Marland Kitchen gabapentin (NEURONTIN) 600 MG tablet Take 1 tablet (600 mg total) by mouth 2 (two) times daily. 60 tablet 0  . guaifenesin (HUMIBID E) 400 MG TABS tablet Take 1 tablet (400  mg total) by mouth every 6 (six) hours as needed. 10 tablet 0  . hydrocortisone (ANUSOL-HC) 2.5 % rectal cream Place 1 application rectally 2 (two) times daily as needed for hemorrhoids or itching.     . hydrocortisone 1 % lotion Apply 1 application topically See admin instructions. Apply small amount to back twice daily for itchy rash    . insulin aspart (NOVOLOG) 100 UNIT/ML injection Inject 4 Units into the skin 3 (three) times daily with meals. 10 mL 0  . insulin aspart protamine - aspart (NOVOLOG MIX 70/30 FLEXPEN) (70-30) 100 UNIT/ML FlexPen Inject 0.1 mLs (10 Units total) into the skin 2 (two) times daily. 15 mL 0  . ipratropium-albuterol (DUONEB) 0.5-2.5 (3) MG/3ML SOLN Take 3 mLs by nebulization 2 (two) times daily as needed (shortness of breath/wheezing).     . isosorbide-hydrALAZINE (BIDIL) 20-37.5 MG tablet Take 0.5 tablets by mouth 3 (three) times daily. 90 tablet 0  . metolazone (ZAROXOLYN) 2.5 MG tablet Take 1 tablet (2.5 mg total) by mouth every Wednesday. Take extra Potassium 29mq when taking this medication  **Please also take Metolazone 2.5 mg Every Friday and Take extra Potassium 273m when taking this medication.** (Patient taking differently: Take 2.5 mg by  mouth every Wednesday. Take extra Potassium 49mq when taking this medication  **Please also take Metolazone 2.5 mg Every Friday and Take extra Potassium 270m when taking this medication.**) 30 tablet 0  . OXYGEN Inhale 3 L into the lungs continuous.     . pantoprazole (PROTONIX) 40 MG tablet Take 1 tablet (40 mg total) by mouth 2 (two) times daily. 60 tablet 0  . polyethylene glycol (MIRALAX / GLYCOLAX) packet Take 17 g by mouth daily. (Patient taking differently: Take 17 g by mouth daily as needed (constipation). ) 14 each 0  . predniSONE (DELTASONE) 10 MG tablet Take 6 tablets (60 mg total) by mouth daily with breakfast. Take 6 tabs tomorrow and then decrease by 1 tab daily until finished 21 tablet 0  . rosuvastatin  (CRESTOR) 20 MG tablet Take 1 tablet (20 mg total) by mouth daily at 6 PM. 30 tablet 0  . sodium chloride (OCEAN) 0.65 % SOLN nasal spray Place 2 sprays into both nostrils as needed for congestion.     . tamsulosin (FLOMAX) 0.4 MG CAPS capsule Take 1 capsule (0.4 mg total) by mouth daily after supper. 30 capsule 3  . torsemide (DEMADEX) 20 MG tablet Take 5 tablets (100 mg total) by mouth 2 (two) times daily. 60 tablet 0  . latanoprost (XALATAN) 0.005 % ophthalmic solution Place 1 drop into both eyes at bedtime.     No current facility-administered medications for this encounter.     Allergies:   Patient has no known allergies.   Social History:  The patient  reports that he quit smoking about 5 months ago. His smoking use included cigarettes. He has a 34.00 pack-year smoking history. He has never used smokeless tobacco. He reports that he does not drink alcohol or use drugs.   Family History:  The patient's family history includes Heart disease in his father and mother; Heart failure in his mother; Hypertension in his mother and sister.   ROS:  Please see the history of present illness.   All other systems are personally reviewed and negative.   Exam:  Video Health Call; Exam is visual.In the bed.  General:  Speaks in full sentences. No resp difficulty. Lungs: Normal respiratory effort with conversation on 3 liters oxygen.  Abdomen: Non-distended per patient report Extremities: R andl LLE 2-3+ edema. Neuro: Alert & oriented x 3.   Recent Labs: 04/09/2019: ALT 25; B Natriuretic Peptide 1,843.4 04/10/2019: TSH 0.587 04/14/2019: Magnesium 1.7 04/20/2019: BUN 26; Creatinine, Ser 1.32; Hemoglobin 9.8; Platelets 487; Potassium 5.9; Sodium 139  Personally reviewed   Wt Readings from Last 3 Encounters:  05/05/19 122 kg (269 lb)  05/05/19 121.6 kg (268 lb)  04/27/19 121.6 kg (268 lb)      ASSESSMENT AND PLAN:  1.Biventricular HF: Nonischemic cardiomyopathy.With prominent RV failure, likely  related to COPD/OSA/OHS. TEE in 3/20 with EF 40-45%, severely dilated RV with moderately decreased systolic function. He has had multiple recent admissions with CHF and COPD exacerbations.He has not cooperated with paramedicine or [facility] home health as outpatient, has not been using CPAP per his wife, suspect significant medication noncompliance.   Recently admit he was placed on inotropes but per Dr McAundra Dubinould NOT re-attempt inotropic support as it did not help much and he is not candidate for home inotropes.  -NYHA IIIb. Volume status elevated. Continue torsemide 100 mg twice a day and increase metolazone to M-W-F  -Changebisoprolol 2.5 at bedtime. - Continue Bidil 0.5 tab tid  - Off spironolactone with  hyyperkalemia.  -Long-term prognosis, unfortunately, will be poor with significant RV dysfunction and history of poor compliance.  - Discussed referral to Hospice versus Palliative Care to discuss goals of care. He is currently not interested.  2. Anemia: Last admission had GI workup, colonoscopy 5/8 unremarkable but EGD showed necrotic mucosa gastric fundus/body that is concerning for gastric cancer, biopsies taken which were negative for malignancy.  Followed by GI.  3. Atrial fibrillation/flutter: Patient has history of paroxysmal atrial fibrillation. Now in NSR with PACs. Was seen in past by Dr. Rayann Heman, not thought to be good ablation candidate. He has not been on Tikosyn or amiodarone due to history of long QT.  - Continue Eliquis. No bleeding issues.  4. COPD: Severe, on 3L home oxygen.  - On home oxygen 3 liters oxygen. 5. OHS/OSA: On home oxygen. Per wife, has not been using CPAP at home.  6. CAD: Nonobstructive on prior cath. No s/s ischemia. He is on Crestor. Off ASA with DOAC and GI bleed  7 Chronic kidney disease stage III: Baseline seems to be 1.3-1.6.  [facility] to check BMET next week.  8. . DM2 with poor control Per PCP  9. CVA: 03/2019, MRI head showed both  small acute/subacute and old CVAs. Neuro has seen, suspect related to atrial fibrillation.  - He is on Eliquis. 10. Urinary Retention- needs follow up at the [place].  Continue Flomax  COVID screen The patient does not have any symptoms that suggest any further testing/ screening at this time.  Social distancing reinforced today.  Patient Risk: After full review of this patients clinical status, I feel that they are at moderate risk for cardiac decompensation at this time.  Relevant cardiac medications were reviewed at length with the patient today. The patient does not have concerns regarding their medications at this time.   The following changes were made today:  Increase metolazone to three times  a week.  Recommended follow-up:  Follow up 4 weeks with virtual visit . Discussed referral to Palliative Care versus Hospice. He is not interested.   Today, I have spent 15 minutes with the patient with telehealth technology discussing the above issues .    Jeanmarie Hubert, NP  05/05/2019 2:53 PM  [place] [address] [facility] [place] [address] [phone] (office) [phone] (fax)

## 2019-05-05 NOTE — Progress Notes (Signed)
And refill protonix Increase metolazone to three times a week.  Please refill metolazone and crestor.   Follow up in 4 weeks for virtual visit.

## 2019-05-05 NOTE — Progress Notes (Signed)
Paramedicine Encounter    Patient ID: Nicholas Caldwell, male    DOB: 1946-01-13, 73 y.o.   MRN: [id number]   Patient Care Team: [facility] as PCP - General Jettie Booze, MD as Consulting Physician (Cardiology) Sharmon Revere as Physician Assistant (Cardiology) [name], MD as Attending Physician (Internal Medicine) Thompson Grayer, MD as Consulting Physician (Clinical Cardiac Electrophysiology)  Patient Active Problem List   Diagnosis Date Noted  . Lobar pneumonia ([place]) 04/14/2019  . Hyperkalemia 04/10/2019  . Cerebral embolism with cerebral infarction 03/21/2019  . Gastritis and gastroduodenitis   . NSVT (nonsustained ventricular tachycardia) ([place]) 03/08/2019  . Tobacco abuse counseling 03/08/2019  . Iron deficiency anemia   . Acute on chronic systolic CHF (congestive heart failure) ([place]) 03/06/2019  . Diabetes mellitus type 2 in obese ([place])   . Primary osteoarthritis of right hip   . Hypomagnesemia   . Steroid-induced hyperglycemia   . Supplemental oxygen dependent   . Leukocytosis   . Hypokalemia   . Acute on chronic anemia   . Diabetic peripheral neuropathy ([place])   . Acute on chronic systolic (congestive) heart failure ([place])   . Acute on chronic respiratory failure ([place]) 01/14/2019  . Hypoxia   . Altered mental status   . Heart failure ([place]) 12/30/2018  . AKI (acute kidney injury) ([place])   . Acute respiratory failure ([place]) 11/22/2018  . Acute on chronic respiratory failure with hypoxia and hypercapnia ([place]) 11/13/2018  . Metabolic encephalopathy 84/53/6468  . Acute respiratory failure with hypoxia and hypercapnia ([place]) 07/26/2018  . Acute exacerbation of CHF (congestive heart failure) ([place]) 07/26/2018  . CHF exacerbation ([place]) 05/01/2018  . CHF (congestive heart failure) ([place]) 04/30/2018  . Chronic respiratory failure with hypoxia ([place]) 12/28/2017  . Acute encephalopathy   . Atrial fibrillation with RVR ([place])   . SVT (supraventricular  tachycardia) ([place])   . COPD GOLD 0   . Medically noncompliant   . Panlobular emphysema ([place])   . OSA (obstructive sleep apnea)   . Pulmonary hypertension ([place])   . Disorientation   . Pressure injury of skin 09/07/2016  . Acute on chronic combined systolic and diastolic CHF (congestive heart failure) ([place]) 09/05/2016  . Skin lesion-left heal 09/05/2016  . Chronic systolic CHF (congestive heart failure) ([place]) 07/16/2016  . COPD (chronic obstructive pulmonary disease) ([place]) 07/16/2016  . Uncontrolled type 2 diabetes mellitus with complication ([place])   . Diabetic polyneuropathy associated with diabetes mellitus due to underlying condition ([place])   . Normocytic anemia 06/29/2016  . CAD - Non-obstructive by LHC1/16 01/21/2016  . Nonischemic cardiomyopathy ([place]) 10/09/2015  . PAF (paroxysmal atrial fibrillation) ([place])   . Chronic pain 02/19/2015  . Morbid obesity ([place]) 02/13/2015  . Dyspnea   . Elevated troponin I level 11/01/2014  . COPD exacerbation ([place]) 11/01/2014  . Essential hypertension 10/31/2014  . Type 2 diabetes mellitus with neuropathy 10/31/2014  . Hyperlipidemia  10/31/2014  . Cigarette smoker 10/31/2014    Current Outpatient Medications:  .  albuterol (VENTOLIN HFA) 108 (90 Base) MCG/ACT inhaler, Inhale 2 puffs into the lungs every 6 (six) hours as needed for wheezing or shortness of breath., Disp: , Rfl:  .  apixaban (ELIQUIS) 5 MG TABS tablet, Take 1 tablet (5 mg total) by mouth 2 (two) times daily., Disp: 60 tablet, Rfl: 0 .  bisoprolol (ZEBETA) 5 MG tablet, Take 0.5 tablets (2.5 mg total) by mouth at bedtime., Disp: 30 tablet, Rfl: 3 .  budesonide-formoterol (SYMBICORT) 160-4.5 MCG/ACT inhaler,  Inhale 2 puffs into the lungs 2 (two) times daily., Disp: , Rfl:  .  ferrous sulfate 325 (65 FE) MG tablet, Take 1 tablet (325 mg total) by mouth 2 (two) times daily with a meal., Disp: 60 tablet, Rfl: 0 .  fluticasone (FLONASE) 50 MCG/ACT nasal spray, Place 2 sprays into both  nostrils daily as needed for allergies. , Disp: , Rfl:  .  gabapentin (NEURONTIN) 600 MG tablet, Take 1 tablet (600 mg total) by mouth 2 (two) times daily., Disp: 60 tablet, Rfl: 0 .  hydrocortisone (ANUSOL-HC) 2.5 % rectal cream, Place 1 application rectally 2 (two) times daily as needed for hemorrhoids or itching. , Disp: , Rfl:  .  hydrocortisone 1 % lotion, Apply 1 application topically See admin instructions. Apply small amount to back twice daily for itchy rash, Disp: , Rfl:  .  insulin aspart (NOVOLOG) 100 UNIT/ML injection, Inject 4 Units into the skin 3 (three) times daily with meals., Disp: 10 mL, Rfl: 0 .  insulin aspart protamine - aspart (NOVOLOG MIX 70/30 FLEXPEN) (70-30) 100 UNIT/ML FlexPen, Inject 0.1 mLs (10 Units total) into the skin 2 (two) times daily., Disp: 15 mL, Rfl: 0 .  ipratropium-albuterol (DUONEB) 0.5-2.5 (3) MG/3ML SOLN, Take 3 mLs by nebulization 2 (two) times daily as needed (shortness of breath/wheezing). , Disp: , Rfl:  .  isosorbide-hydrALAZINE (BIDIL) 20-37.5 MG tablet, Take 0.5 tablets by mouth 3 (three) times daily., Disp: 90 tablet, Rfl: 0 .  latanoprost (XALATAN) 0.005 % ophthalmic solution, Place 1 drop into both eyes at bedtime., Disp: , Rfl:  .  metolazone (ZAROXOLYN) 2.5 MG tablet, Take 1 tablet (2.5 mg total) by mouth every Wednesday. Take extra Potassium 5mq when taking this medication  **Please also take Metolazone 2.5 mg Every Friday and Take extra Potassium 258m when taking this medication.** (Patient taking differently: Take 2.5 mg by mouth every Wednesday. Take extra Potassium 2042mwhen taking this medication  **Please also take Metolazone 2.5 mg Every Friday and Take extra Potassium 63m81mhen taking this medication.**), Disp: 30 tablet, Rfl: 0 .  OXYGEN, Inhale 3 L into the lungs continuous. , Disp: , Rfl:  .  pantoprazole (PROTONIX) 40 MG tablet, Take 1 tablet (40 mg total) by mouth 2 (two) times daily., Disp: 60 tablet, Rfl: 0 .  polyethylene  glycol (MIRALAX / GLYCOLAX) packet, Take 17 g by mouth daily. (Patient taking differently: Take 17 g by mouth daily as needed (constipation). ), Disp: 14 each, Rfl: 0 .  rosuvastatin (CRESTOR) 20 MG tablet, Take 1 tablet (20 mg total) by mouth daily at 6 PM., Disp: 30 tablet, Rfl: 0 .  sodium chloride (OCEAN) 0.65 % SOLN nasal spray, Place 2 sprays into both nostrils as needed for congestion. , Disp: , Rfl:  .  tamsulosin (FLOMAX) 0.4 MG CAPS capsule, Take 1 capsule (0.4 mg total) by mouth daily after supper., Disp: 30 capsule, Rfl: 3 .  torsemide (DEMADEX) 20 MG tablet, Take 5 tablets (100 mg total) by mouth 2 (two) times daily., Disp: 60 tablet, Rfl: 0 .  guaifenesin (HUMIBID E) 400 MG TABS tablet, Take 1 tablet (400 mg total) by mouth every 6 (six) hours as needed. (Patient not taking: Reported on 05/05/2019), Disp: 10 tablet, Rfl: 0 .  predniSONE (DELTASONE) 10 MG tablet, Take 6 tablets (60 mg total) by mouth daily with breakfast. Take 6 tabs tomorrow and then decrease by 1 tab daily until finished (Patient not taking: Reported on 04/27/2019), Disp: 21 tablet, Rfl:  0 No Known Allergies    Social History   Socioeconomic History  . Marital status: Married    Spouse name: Not on file  . Number of children: Not on file  . Years of education: Not on file  . Highest education level: Not on file  Occupational History  . Not on file  Social Needs  . Financial resource strain: Not on file  . Food insecurity    Worry: Not on file    Inability: Not on file  . Transportation needs    Medical: Not on file    Non-medical: Not on file  Tobacco Use  . Smoking status: Former Smoker    Packs/day: 1.00    Years: 34.00    Pack years: 34.00    Types: Cigarettes    Quit date: 11/30/2018    Years since quitting: 0.4  . Smokeless tobacco: Never Used  Substance and Sexual Activity  . Alcohol use: No    Alcohol/week: 0.0 standard drinks  . Drug use: No  . Sexual activity: Not on file  Lifestyle  .  Physical activity    Days per week: Not on file    Minutes per session: Not on file  . Stress: Not on file  Relationships  . Social Herbalist on phone: Not on file    Gets together: Not on file    Attends religious service: Not on file    Active member of club or organization: Not on file    Attends meetings of clubs or organizations: Not on file    Relationship status: Not on file  . Intimate partner violence    Fear of current or ex partner: Not on file    Emotionally abused: Not on file    Physically abused: Not on file    Forced sexual activity: Not on file  Other Topics Concern  . Not on file  Social History Narrative  . Not on file    Physical Exam      No future appointments.  BP (!) 112/52   Pulse (!) 102   Temp 98.4 F (36.9 C)   Resp (!) 26   Wt 269 lb (122 kg)   SpO2 (!) 84% Comment: 3L 02  BMI 36.48 kg/m   Weight yesterday-268 Last visit weight-268  Pt was in bathroom when I arrived, when pt got back to bed he was very sob, his pulse ox was low-mid 80s on 3L02 and his respirations were increased. He states he has been weak for 2 days, he has edema to his legs, wheezing to both sides of lower lobes.  Per wife he only takes his torsemide once a day--he doesn't like taking it.  meds verified-pill box refilled.  Virtual visit done with amy. Pt is still not wanting hospice involved yet. I dont think he comprehends yet how sick he is.  Spoke with wife and advised her to speak to him about letting palliative care come out and have those conversations with him.  Wife opened some packages and there was an antibiotic and prednisone in there--it was filled on 6/12 and was sent out but pt never did open it up. That was started and he will finish those this week. Antibiotic was for 2 days only.   Metolazone increased to mon/wed//fri---he has it until 7/10 Rosuvastatin-he has enough for 2 days His tamsulosin is out of refills and he has about 2wks left.  Wife spoke to [place] about it.  They  are going to call her back regarding the refills.    Marylouise Stacks, [facility] Paramedic  05/05/19

## 2019-05-05 NOTE — Telephone Encounter (Signed)
Orders signed and faxed to [facility]. Copy placed in folder for [facility].

## 2019-05-05 NOTE — Addendum Note (Signed)
Encounter addended by: Marlise Eves, RN on: 05/05/2019 4:02 PM  Actions taken: Clinical Note Signed, Order list changed

## 2019-05-05 NOTE — Patient Instructions (Addendum)
START Metolazone 2.34m every Monday, Wednesday, and Friday.   REFILLED: Protonix and Crestor  [facility]. [facility] you and your health needs are our priority. As part of our continuing mission to provide you with exceptional heart care, we have created designated Provider Care Teams. These Care Teams include your primary Cardiologist (physician) and Advanced Practice Providers (APPs- Physician Assistants and Nurse Practitioners) who all work together to provide you with the care you need, when you need it.   You may see any of the following providers on your designated Care Team at your next follow up: .Marland KitchenDr DGlori Bickers. Dr DLoralie Champagne. ADarrick Grinder NP

## 2019-05-09 ENCOUNTER — Telehealth (HOSPITAL_COMMUNITY): Payer: Self-pay | Admitting: Licensed Clinical Social Worker

## 2019-05-09 NOTE — Telephone Encounter (Addendum)
CSW called pt to check in on how CPAP is functioning.  CSW spoke to pt wife on 6/25 as well as [facility] but patient had not used the CPAP enough to assess if there were any issues.  CSW informed pt and wife at that time he needed to utilize the CPAP regularly for several days so the [place] clinic could check its functioning so CSW calling back to check if he was able to wear it for several nights in a row.  Pt wife reports pt has not been using it so CSW reiterated previous conversation and encouraged him to wear it several nights in a row [facility]- pt wife expressed understanding.  CSW will continue to follow and assist as needed  Jorge Ny, [facility] Desk#: [phone] Cell#: [phone]

## 2019-05-11 ENCOUNTER — Other Ambulatory Visit (HOSPITAL_COMMUNITY): Payer: Self-pay

## 2019-05-11 NOTE — Progress Notes (Signed)
Paramedicine Encounter    Patient ID: Nicholas Caldwell, male    DOB: 1946-06-03, 73 y.o.   MRN: [id number]   Patient Care Team: [facility] as PCP - General Jettie Booze, MD as Consulting Physician (Cardiology) Sharmon Revere as Physician Assistant (Cardiology) [name], MD as Attending Physician (Internal Medicine) Thompson Grayer, MD as Consulting Physician (Clinical Cardiac Electrophysiology)  Patient Active Problem List   Diagnosis Date Noted  . Lobar pneumonia ([place]) 04/14/2019  . Hyperkalemia 04/10/2019  . Cerebral embolism with cerebral infarction 03/21/2019  . Gastritis and gastroduodenitis   . NSVT (nonsustained ventricular tachycardia) ([place]) 03/08/2019  . Tobacco abuse counseling 03/08/2019  . Iron deficiency anemia   . Acute on chronic systolic CHF (congestive heart failure) ([place]) 03/06/2019  . Diabetes mellitus type 2 in obese ([place])   . Primary osteoarthritis of right hip   . Hypomagnesemia   . Steroid-induced hyperglycemia   . Supplemental oxygen dependent   . Leukocytosis   . Hypokalemia   . Acute on chronic anemia   . Diabetic peripheral neuropathy ([place])   . Acute on chronic systolic (congestive) heart failure ([place])   . Acute on chronic respiratory failure ([place]) 01/14/2019  . Hypoxia   . Altered mental status   . Heart failure ([place]) 12/30/2018  . AKI (acute kidney injury) ([place])   . Acute respiratory failure ([place]) 11/22/2018  . Acute on chronic respiratory failure with hypoxia and hypercapnia ([place]poli) 11/13/2018  . Metabolic encephalopathy 24/23/5361  . Acute respiratory failure with hypoxia and hypercapnia ([place]) 07/26/2018  . Acute exacerbation of CHF (congestive heart failure) ([place]unn[place]) 07/26/2018  . CHF exacerbation ([place]) 05/01/2018  . CHF (congestive heart failure) ([place]) 04/30/2018  . Chronic respiratory failure with hypoxia ([place]) 12/28/2017  . Acute encephalopathy   . Atrial fibrillation with RVR ([place])   . SVT (supraventricular  tachycardia) ([place])   . COPD GOLD 0   . Medically noncompliant   . Panlobular emphysema ([place])   . OSA (obstructive sleep apnea)   . Pulmonary hypertension ([place])   . Disorientation   . Pressure injury of skin 09/07/2016  . Acute on chronic combined systolic and diastolic CHF (congestive heart failure) ([place]) 09/05/2016  . Skin lesion-left heal 09/05/2016  . Chronic systolic CHF (congestive heart failure) ([place]) 07/16/2016  . COPD (chronic obstructive pulmonary disease) ([place]) 07/16/2016  . Uncontrolled type 2 diabetes mellitus with complication ([place])   . Diabetic polyneuropathy associated with diabetes mellitus due to underlying condition ([place])   . Normocytic anemia 06/29/2016  . CAD - Non-obstructive by LHC1/16 01/21/2016  . Nonischemic cardiomyopathy ([place]) 10/09/2015  . PAF (paroxysmal atrial fibrillation) ([place])   . Chronic pain 02/19/2015  . Morbid obesity ([place]) 02/13/2015  . Dyspnea   . Elevated troponin I level 11/01/2014  . COPD exacerbation ([place]) 11/01/2014  . Essential hypertension 10/31/2014  . Type 2 diabetes mellitus with neuropathy 10/31/2014  . Hyperlipidemia  10/31/2014  . Cigarette smoker 10/31/2014    Current Outpatient Medications:  .  albuterol (VENTOLIN HFA) 108 (90 Base) MCG/ACT inhaler, Inhale 2 puffs into the lungs every 6 (six) hours as needed for wheezing or shortness of breath., Disp: , Rfl:  .  apixaban (ELIQUIS) 5 MG TABS tablet, Take 1 tablet (5 mg total) by mouth 2 (two) times daily., Disp: 60 tablet, Rfl: 0 .  bisoprolol (ZEBETA) 5 MG tablet, Take 0.5 tablets (2.5 mg total) by mouth at bedtime., Disp: 30 tablet, Rfl: 3 .  budesonide-formoterol (SYMBICORT) 160-4.5 MCG/ACT inhaler,  Inhale 2 puffs into the lungs 2 (two) times daily., Disp: , Rfl:  .  ferrous sulfate 325 (65 FE) MG tablet, Take 1 tablet (325 mg total) by mouth 2 (two) times daily with a meal., Disp: 60 tablet, Rfl: 0 .  fluticasone (FLONASE) 50 MCG/ACT nasal spray, Place 2 sprays into both  nostrils daily as needed for allergies. , Disp: , Rfl:  .  gabapentin (NEURONTIN) 600 MG tablet, Take 1 tablet (600 mg total) by mouth 2 (two) times daily., Disp: 60 tablet, Rfl: 0 .  hydrocortisone (ANUSOL-HC) 2.5 % rectal cream, Place 1 application rectally 2 (two) times daily as needed for hemorrhoids or itching. , Disp: , Rfl:  .  hydrocortisone 1 % lotion, Apply 1 application topically See admin instructions. Apply small amount to back twice daily for itchy rash, Disp: , Rfl:  .  insulin aspart (NOVOLOG) 100 UNIT/ML injection, Inject 4 Units into the skin 3 (three) times daily with meals., Disp: 10 mL, Rfl: 0 .  insulin aspart protamine - aspart (NOVOLOG MIX 70/30 FLEXPEN) (70-30) 100 UNIT/ML FlexPen, Inject 0.1 mLs (10 Units total) into the skin 2 (two) times daily., Disp: 15 mL, Rfl: 0 .  ipratropium-albuterol (DUONEB) 0.5-2.5 (3) MG/3ML SOLN, Take 3 mLs by nebulization 2 (two) times daily as needed (shortness of breath/wheezing). , Disp: , Rfl:  .  isosorbide-hydrALAZINE (BIDIL) 20-37.5 MG tablet, Take 0.5 tablets by mouth 3 (three) times daily., Disp: 90 tablet, Rfl: 0 .  latanoprost (XALATAN) 0.005 % ophthalmic solution, Place 1 drop into both eyes at bedtime., Disp: , Rfl:  .  metolazone (ZAROXOLYN) 2.5 MG tablet, Take 1 tablet (2.5 mg total) by mouth every Monday, Wednesday, and Friday. Take extra Potassium 94mq when taking this medication, Disp: 30 tablet, Rfl: 0 .  OXYGEN, Inhale 3 L into the lungs continuous. , Disp: , Rfl:  .  pantoprazole (PROTONIX) 40 MG tablet, Take 1 tablet (40 mg total) by mouth 2 (two) times daily., Disp: 60 tablet, Rfl: 0 .  polyethylene glycol (MIRALAX / GLYCOLAX) packet, Take 17 g by mouth daily. (Patient taking differently: Take 17 g by mouth daily as needed (constipation). ), Disp: 14 each, Rfl: 0 .  rosuvastatin (CRESTOR) 20 MG tablet, Take 1 tablet (20 mg total) by mouth daily at 6 PM., Disp: 30 tablet, Rfl: 3 .  sodium chloride (OCEAN) 0.65 % SOLN nasal  spray, Place 2 sprays into both nostrils as needed for congestion. , Disp: , Rfl:  .  tamsulosin (FLOMAX) 0.4 MG CAPS capsule, Take 1 capsule (0.4 mg total) by mouth daily after supper., Disp: 30 capsule, Rfl: 3 .  torsemide (DEMADEX) 20 MG tablet, Take 5 tablets (100 mg total) by mouth 2 (two) times daily., Disp: 60 tablet, Rfl: 0 .  guaifenesin (HUMIBID E) 400 MG TABS tablet, Take 1 tablet (400 mg total) by mouth every 6 (six) hours as needed. (Patient not taking: Reported on 05/11/2019), Disp: 10 tablet, Rfl: 0 .  predniSONE (DELTASONE) 10 MG tablet, Take 6 tablets (60 mg total) by mouth daily with breakfast. Take 6 tabs tomorrow and then decrease by 1 tab daily until finished (Patient not taking: Reported on 05/11/2019), Disp: 21 tablet, Rfl: 0 No Known Allergies    Social History   Socioeconomic History  . Marital status: Married    Spouse name: Not on file  . Number of children: Not on file  . Years of education: Not on file  . Highest education level: Not on file  Occupational History  . Not on file  Social Needs  . Financial resource strain: Not on file  . Food insecurity    Worry: Not on file    Inability: Not on file  . Transportation needs    Medical: Not on file    Non-medical: Not on file  Tobacco Use  . Smoking status: Former Smoker    Packs/day: 1.00    Years: 34.00    Pack years: 34.00    Types: Cigarettes    Quit date: 11/30/2018    Years since quitting: 0.4  . Smokeless tobacco: Never Used  Substance and Sexual Activity  . Alcohol use: No    Alcohol/week: 0.0 standard drinks  . Drug use: No  . Sexual activity: Not on file  Lifestyle  . Physical activity    Days per week: Not on file    Minutes per session: Not on file  . Stress: Not on file  Relationships  . Social Herbalist on phone: Not on file    Gets together: Not on file    Attends religious service: Not on file    Active member of club or organization: Not on file    Attends meetings  of clubs or organizations: Not on file    Relationship status: Not on file  . Intimate partner violence    Fear of current or ex partner: Not on file    Emotionally abused: Not on file    Physically abused: Not on file    Forced sexual activity: Not on file  Other Topics Concern  . Not on file  Social History Narrative  . Not on file    Physical Exam      Future Appointments  Date Time Provider [place]  06/08/2019  2:30 PM [facility] PA/NP [facility] None    BP (!) 122/58   Pulse 68   Temp 97.7 F (36.5 C)   Resp (!) 22   SpO2 93%   Weight yesterday-? Last visit weight-269  Pt was lying in bed when I arrived, as that is where he always is. He does not look good today, his breathing is doing about the same as last week which was increased, however his pulse ox is better today at 93%-last week it was in the 80s. Lungs sound terrible again-wheezing all over again. He used inhalers last night. He states he has increased his CPAP use at about 3-4 hrs per night.  Fluid looks about the same. He states he has increased urine output. He still is not interested in palliative care right now. He basically stays in the bed, goes to bathroom and pretty much it. He states sometimes he makes it to the kitchen or living room. I explained last visit to wife that she should talk to her husband about palliative care and he was very sick.   Marylouise Stacks, [facility] Paramedic  05/11/19

## 2019-05-12 DIAGNOSIS — A419 Sepsis, unspecified organism: Secondary | ICD-10-CM | POA: Diagnosis not present

## 2019-05-12 DIAGNOSIS — N390 Urinary tract infection, site not specified: Secondary | ICD-10-CM | POA: Diagnosis not present

## 2019-05-12 DIAGNOSIS — J441 Chronic obstructive pulmonary disease with (acute) exacerbation: Secondary | ICD-10-CM | POA: Diagnosis not present

## 2019-05-12 DIAGNOSIS — J189 Pneumonia, unspecified organism: Secondary | ICD-10-CM | POA: Diagnosis not present

## 2019-05-12 DIAGNOSIS — J440 Chronic obstructive pulmonary disease with acute lower respiratory infection: Secondary | ICD-10-CM | POA: Diagnosis not present

## 2019-05-12 DIAGNOSIS — B9689 Other specified bacterial agents as the cause of diseases classified elsewhere: Secondary | ICD-10-CM | POA: Diagnosis not present

## 2019-05-16 ENCOUNTER — Telehealth (HOSPITAL_COMMUNITY): Payer: Self-pay | Admitting: *Deleted

## 2019-05-16 NOTE — Telephone Encounter (Signed)
Pt's wife called concerned about pt, she states with him taking all the diuretics he can not control his urination and is wetting everything frequently, she has discussed this with HH and they have recommended a condom cath they just need an order from [place].  Advised pcp would normally order this however he only sees [place] and [place] will not order without seeing him and he is too weak to go there.  Per Dr [name] ok for [place] to order, order faxed to [facility] at [phone]

## 2019-05-17 ENCOUNTER — Telehealth (HOSPITAL_COMMUNITY): Payer: Self-pay | Admitting: Cardiology

## 2019-05-17 DIAGNOSIS — J441 Chronic obstructive pulmonary disease with (acute) exacerbation: Secondary | ICD-10-CM | POA: Diagnosis not present

## 2019-05-17 DIAGNOSIS — B9689 Other specified bacterial agents as the cause of diseases classified elsewhere: Secondary | ICD-10-CM | POA: Diagnosis not present

## 2019-05-17 DIAGNOSIS — J189 Pneumonia, unspecified organism: Secondary | ICD-10-CM | POA: Diagnosis not present

## 2019-05-17 DIAGNOSIS — J440 Chronic obstructive pulmonary disease with acute lower respiratory infection: Secondary | ICD-10-CM | POA: Diagnosis not present

## 2019-05-17 DIAGNOSIS — A419 Sepsis, unspecified organism: Secondary | ICD-10-CM | POA: Diagnosis not present

## 2019-05-17 DIAGNOSIS — N390 Urinary tract infection, site not specified: Secondary | ICD-10-CM | POA: Diagnosis not present

## 2019-05-17 DIAGNOSIS — I2510 Atherosclerotic heart disease of native coronary artery without angina pectoris: Secondary | ICD-10-CM | POA: Diagnosis not present

## 2019-05-17 NOTE — Telephone Encounter (Signed)
Increase metolazone to 4 days/week and have him come in for office evaluation.

## 2019-05-17 NOTE — Telephone Encounter (Signed)
Kiouna,RN with [facility] called during patients home visit with the following to report   B/P 92/58 asymptomatic   No BM x 4-5 days -was advised by [facility] to take miralax daily until [facility] If patient continues to have issues can further discuss with PCP   Wheezing in left lower lobe Angina RLE edema 3+  Most recent OV metolazone was increased to MWF  However concerned if patient increased medications with concerns of urinating on himself.  Will forward to provider for further instructions if needed.

## 2019-05-18 ENCOUNTER — Other Ambulatory Visit (HOSPITAL_COMMUNITY): Payer: Self-pay

## 2019-05-18 ENCOUNTER — Telehealth (HOSPITAL_COMMUNITY): Payer: Self-pay | Admitting: Cardiology

## 2019-05-18 ENCOUNTER — Other Ambulatory Visit (HOSPITAL_COMMUNITY): Payer: Self-pay | Admitting: Cardiology

## 2019-05-18 MED ORDER — ROSUVASTATIN CALCIUM 20 MG PO TABS: 20.0000 mg | ORAL_TABLET | Freq: Every day | ORAL | 3 refills | Status: DC

## 2019-05-18 MED ORDER — METOLAZONE 2.5 MG PO TABS: 2.5000 mg | ORAL_TABLET | ORAL | 0 refills | Status: DC

## 2019-05-18 MED ORDER — METOLAZONE 2.5 MG PO TABS: 2.5000 mg | ORAL_TABLET | ORAL | 3 refills | Status: DC

## 2019-05-18 MED ORDER — BISOPROLOL FUMARATE 5 MG PO TABS: 2.5000 mg | ORAL_TABLET | Freq: Every day | ORAL | 3 refills | Status: DC

## 2019-05-18 MED ORDER — TORSEMIDE 100 MG PO TABS: ORAL_TABLET | ORAL | 3 refills | Status: DC

## 2019-05-18 MED ORDER — ISOSORB DINITRATE-HYDRALAZINE 20-37.5 MG PO TABS: 0.5000 | ORAL_TABLET | Freq: Three times a day (TID) | ORAL | 3 refills | Status: DC

## 2019-05-18 MED ORDER — TORSEMIDE 100 MG PO TABS: 100.0000 mg | ORAL_TABLET | Freq: Two times a day (BID) | ORAL | 3 refills | Status: DC

## 2019-05-18 NOTE — Telephone Encounter (Signed)
Abnormal labs received from [place] drawn 05/17/19 Cr 2.27 BUN 58 K 2.7  Per Dr [name] Hold torsemide x 1 day (2 doses) Then decrease torsemide to 100 mg daily for 2 days, then 100/50 thereafter Keep follow up a scheduled   Katie with para medicine aware of medication changes and reports she will assist with med changes

## 2019-05-18 NOTE — Addendum Note (Signed)
Addended by: Kerry Dory on: 05/18/2019 04:01 PM   Modules accepted: Orders

## 2019-05-18 NOTE — Addendum Note (Signed)
Addended by: Kerry Dory on: 05/18/2019 02:04 PM   Modules accepted: Orders

## 2019-05-18 NOTE — Progress Notes (Signed)
Received call from clinic about abnormal lab values that was drawn with HHN, his torsemide dose needs adjusting.  Per dr [name], he needs to hold torsemide for Thursday dose and then starting Friday he needs to take 173m for a few days and then go down to 1065min am and 5061mn pm.  That change was made in pill box.  Will have to go back out tomor once she gets the metolazone picked up from local pharmacy.   KatMarylouise StacksMT-Paramedic 05/18/19

## 2019-05-18 NOTE — Progress Notes (Signed)
Paramedicine Encounter    Patient ID: Claud Gowan, male    DOB: [birth year], 73 y.o.   MRN: [id number]   Patient Care Team: [facility] as PCP - General Jettie Booze, MD as Consulting Physician (Cardiology) Sharmon Revere as Physician Assistant (Cardiology) [name], MD as Attending Physician (Internal Medicine) Thompson Grayer, MD as Consulting Physician (Clinical Cardiac Electrophysiology)  Patient Active Problem List   Diagnosis Date Noted  . Lobar pneumonia ([place]) 04/14/2019  . Hyperkalemia 04/10/2019  . Cerebral embolism with cerebral infarction 03/21/2019  . Gastritis and gastroduodenitis   . NSVT (nonsustained ventricular tachycardia) ([place]) 03/08/2019  . Tobacco abuse counseling 03/08/2019  . Iron deficiency anemia   . Acute on chronic systolic CHF (congestive heart failure) ([place]) 03/06/2019  . Diabetes mellitus type 2 in obese ([place])   . Primary osteoarthritis of right hip   . Hypomagnesemia   . Steroid-induced hyperglycemia   . Supplemental oxygen dependent   . Leukocytosis   . Hypokalemia   . Acute on chronic anemia   . Diabetic peripheral neuropathy ([place])   . Acute on chronic systolic (congestive) heart failure ([place])   . Acute on chronic respiratory failure ([place]) 01/14/2019  . Hypoxia   . Altered mental status   . Heart failure ([place]) 12/30/2018  . AKI (acute kidney injury) ([place])   . Acute respiratory failure ([place]) 11/22/2018  . Acute on chronic respiratory failure with hypoxia and hypercapnia ([place]) 11/13/2018  . Metabolic encephalopathy 29/79/8921  . Acute respiratory failure with hypoxia and hypercapnia ([place]) 07/26/2018  . Acute exacerbation of CHF (congestive heart failure) ([place]) 07/26/2018  . CHF exacerbation ([place]) 05/01/2018  . CHF (congestive heart failure) ([place]) 04/30/2018  . Chronic respiratory failure with hypoxia ([place]) 12/28/2017  . Acute encephalopathy   . Atrial fibrillation with RVR ([place])   . SVT (supraventricular  tachycardia) ([place])   . COPD GOLD 0   . Medically noncompliant   . Panlobular emphysema ([place])   . OSA (obstructive sleep apnea)   . Pulmonary hypertension ([place])   . Disorientation   . Pressure injury of skin 09/07/2016  . Acute on chronic combined systolic and diastolic CHF (congestive heart failure) ([place]) 09/05/2016  . Skin lesion-left heal 09/05/2016  . Chronic systolic CHF (congestive heart failure) ([place]) 07/16/2016  . COPD (chronic obstructive pulmonary disease) ([place]) 07/16/2016  . Uncontrolled type 2 diabetes mellitus with complication ([place])   . Diabetic polyneuropathy associated with diabetes mellitus due to underlying condition ([place])   . Normocytic anemia 06/29/2016  . CAD - Non-obstructive by LHC1/16 01/21/2016  . Nonischemic cardiomyopathy ([place]) 10/09/2015  . PAF (paroxysmal atrial fibrillation) ([place])   . Chronic pain 02/19/2015  . Morbid obesity ([place]) 02/13/2015  . Dyspnea   . Elevated troponin I level 11/01/2014  . COPD exacerbation ([place]) 11/01/2014  . Essential hypertension 10/31/2014  . Type 2 diabetes mellitus with neuropathy 10/31/2014  . Hyperlipidemia  10/31/2014  . Cigarette smoker 10/31/2014    Current Outpatient Medications:  .  albuterol (VENTOLIN HFA) 108 (90 Base) MCG/ACT inhaler, Inhale 2 puffs into the lungs every 6 (six) hours as needed for wheezing or shortness of breath., Disp: , Rfl:  .  apixaban (ELIQUIS) 5 MG TABS tablet, Take 1 tablet (5 mg total) by mouth 2 (two) times daily., Disp: 60 tablet, Rfl: 0 .  budesonide-formoterol (SYMBICORT) 160-4.5 MCG/ACT inhaler, Inhale 2 puffs into the lungs 2 (two) times daily., Disp: , Rfl:  .  ferrous sulfate 325 (65 FE) MG  tablet, Take 1 tablet (325 mg total) by mouth 2 (two) times daily with a meal., Disp: 60 tablet, Rfl: 0 .  fluticasone (FLONASE) 50 MCG/ACT nasal spray, Place 2 sprays into both nostrils daily as needed for allergies. , Disp: , Rfl:  .  gabapentin (NEURONTIN) 600 MG tablet, Take 1 tablet (600 mg  total) by mouth 2 (two) times daily., Disp: 60 tablet, Rfl: 0 .  hydrocortisone (ANUSOL-HC) 2.5 % rectal cream, Place 1 application rectally 2 (two) times daily as needed for hemorrhoids or itching. , Disp: , Rfl:  .  hydrocortisone 1 % lotion, Apply 1 application topically See admin instructions. Apply small amount to back twice daily for itchy rash, Disp: , Rfl:  .  insulin aspart (NOVOLOG) 100 UNIT/ML injection, Inject 4 Units into the skin 3 (three) times daily with meals., Disp: 10 mL, Rfl: 0 .  insulin aspart protamine - aspart (NOVOLOG MIX 70/30 FLEXPEN) (70-30) 100 UNIT/ML FlexPen, Inject 0.1 mLs (10 Units total) into the skin 2 (two) times daily., Disp: 15 mL, Rfl: 0 .  ipratropium-albuterol (DUONEB) 0.5-2.5 (3) MG/3ML SOLN, Take 3 mLs by nebulization 2 (two) times daily as needed (shortness of breath/wheezing). , Disp: , Rfl:  .  latanoprost (XALATAN) 0.005 % ophthalmic solution, Place 1 drop into both eyes at bedtime., Disp: , Rfl:  .  OXYGEN, Inhale 3 L into the lungs continuous. , Disp: , Rfl:  .  pantoprazole (PROTONIX) 40 MG tablet, Take 1 tablet (40 mg total) by mouth 2 (two) times daily., Disp: 60 tablet, Rfl: 0 .  polyethylene glycol (MIRALAX / GLYCOLAX) packet, Take 17 g by mouth daily. (Patient taking differently: Take 17 g by mouth daily as needed (constipation). ), Disp: 14 each, Rfl: 0 .  sodium chloride (OCEAN) 0.65 % SOLN nasal spray, Place 2 sprays into both nostrils as needed for congestion. , Disp: , Rfl:  .  tamsulosin (FLOMAX) 0.4 MG CAPS capsule, Take 1 capsule (0.4 mg total) by mouth daily after supper., Disp: 30 capsule, Rfl: 3 .  bisoprolol (ZEBETA) 5 MG tablet, Take 0.5 tablets (2.5 mg total) by mouth at bedtime., Disp: 45 tablet, Rfl: 3 .  guaifenesin (HUMIBID E) 400 MG TABS tablet, Take 1 tablet (400 mg total) by mouth every 6 (six) hours as needed. (Patient not taking: Reported on 05/11/2019), Disp: 10 tablet, Rfl: 0 .  isosorbide-hydrALAZINE (BIDIL) 20-37.5 MG  tablet, Take 0.5 tablets by mouth 3 (three) times daily., Disp: 135 tablet, Rfl: 3 .  [START ON 05/19/2019] metolazone (ZAROXOLYN) 2.5 MG tablet, Take 1 tablet (2.5 mg total) by mouth 4 (four) times a week. Take extra Potassium 50mq when taking this medication, Disp: 30 tablet, Rfl: 3 .  predniSONE (DELTASONE) 10 MG tablet, Take 6 tablets (60 mg total) by mouth daily with breakfast. Take 6 tabs tomorrow and then decrease by 1 tab daily until finished (Patient not taking: Reported on 05/11/2019), Disp: 21 tablet, Rfl: 0 .  rosuvastatin (CRESTOR) 20 MG tablet, Take 1 tablet (20 mg total) by mouth daily at 6 PM., Disp: 90 tablet, Rfl: 3 .  torsemide (DEMADEX) 100 MG tablet, Take 1 tablet (100 mg total) by mouth 2 (two) times daily., Disp: 180 tablet, Rfl: 3 No Known Allergies    Social History   Socioeconomic History  . Marital status: Married    Spouse name: Not on file  . Number of children: Not on file  . Years of education: Not on file  . Highest education level: Not  on file  Occupational History  . Not on file  Social Needs  . Financial resource strain: Not on file  . Food insecurity    Worry: Not on file    Inability: Not on file  . Transportation needs    Medical: Not on file    Non-medical: Not on file  Tobacco Use  . Smoking status: Former Smoker    Packs/day: 1.00    Years: 34.00    Pack years: 34.00    Types: Cigarettes    Quit date: 11/30/2018    Years since quitting: 0.4  . Smokeless tobacco: Never Used  Substance and Sexual Activity  . Alcohol use: No    Alcohol/week: 0.0 standard drinks  . Drug use: No  . Sexual activity: Not on file  Lifestyle  . Physical activity    Days per week: Not on file    Minutes per session: Not on file  . Stress: Not on file  Relationships  . Social Herbalist on phone: Not on file    Gets together: Not on file    Attends religious service: Not on file    Active member of club or organization: Not on file    Attends  meetings of clubs or organizations: Not on file    Relationship status: Not on file  . Intimate partner violence    Fear of current or ex partner: Not on file    Emotionally abused: Not on file    Physically abused: Not on file    Forced sexual activity: Not on file  Other Topics Concern  . Not on file  Social History Narrative  . Not on file    Physical Exam      Future Appointments  Date Time Provider [place]  06/08/2019  2:30 PM [facility] PA/NP [facility] None    BP (!) 110/56   Pulse 68   Temp 97.9 F (36.6 C)   Resp 18   SpO2 95%   Weight yesterday-268 few days ago  Last visit weight-269 [facility]-114  When I arrived pt was sitting in chair, he has been laying in bed the past few times I have been here.  He states he has been doing good, he looks good today. He states his breathing is doing ok, he denies c/p, no dizziness, no h/a.  Pt does have edema noted to his legs as he always to some extent swelling to his legs.  HHN came out the other day and called in to clinic increased edema and increased sob with lower b/p.  His metolazone was increased to 4x a week.  He does state he has had cough past few days with productive yellow sputum. He has been using his neb/inhalers.  Wife states he has been sitting up more, wife states he has been taking his meds good too.  meds verified and pill box refilled.  We ordered meds 2 [facility] ago and [facility] and [facility] no more [facility] on [facility] so it was never filled. He is out of the metolazone.  He has been trying to wear CPAP more often but it is uncomfortable, looking at the mask it is a size small and he needs it bigger. Eliezer Lofts gave [place] number to call directly to [facility] to reach them so wife called them and was able to order larger size.  He is out of pantoprazole as well--that one needs refills sent to [facility] as well. He has enough  to get him through 7/24.  Rosuvastatin he has 3 pills left.  Metolazone needs to be sent to local  pharmacy to be filled asap.  tamsulosin is will be out of those at by 7/30. Pharmacy will not refill due to it not being prescribed by PCP there.  His pill box is filled through 7/30.   Marylouise Stacks, [facility] Paramedic  05/18/19

## 2019-05-18 NOTE — Telephone Encounter (Signed)
Pt aware via wife Patients wife would like to keep follow up as scheduled.

## 2019-05-19 ENCOUNTER — Other Ambulatory Visit (HOSPITAL_COMMUNITY): Payer: Self-pay

## 2019-05-19 ENCOUNTER — Other Ambulatory Visit (HOSPITAL_COMMUNITY): Payer: Self-pay | Admitting: *Deleted

## 2019-05-19 MED ORDER — POTASSIUM CHLORIDE CRYS ER 20 MEQ PO TBCR: EXTENDED_RELEASE_TABLET | ORAL | 3 refills | Status: DC

## 2019-05-19 NOTE — Progress Notes (Signed)
Came out today after wife picked up pts metolazone from local pharmacy. Clarified with clinic that he needs to take 40mq of potassium with the metolazone days.  It was placed in for mon/wed/fri and Saturday.   Will f/u next Wednesday.   KMarylouise Stacks EMT-Paramedic  05/19/19

## 2019-05-20 ENCOUNTER — Telehealth (HOSPITAL_COMMUNITY): Payer: Self-pay

## 2019-05-20 NOTE — Telephone Encounter (Signed)
Home health PT called to inform that they were discharging pt due to non compliance. Stated pt would not answer door or phone calls.

## 2019-05-23 DIAGNOSIS — B9689 Other specified bacterial agents as the cause of diseases classified elsewhere: Secondary | ICD-10-CM | POA: Diagnosis not present

## 2019-05-23 DIAGNOSIS — A419 Sepsis, unspecified organism: Secondary | ICD-10-CM | POA: Diagnosis not present

## 2019-05-23 DIAGNOSIS — J440 Chronic obstructive pulmonary disease with acute lower respiratory infection: Secondary | ICD-10-CM | POA: Diagnosis not present

## 2019-05-23 DIAGNOSIS — J441 Chronic obstructive pulmonary disease with (acute) exacerbation: Secondary | ICD-10-CM | POA: Diagnosis not present

## 2019-05-23 DIAGNOSIS — J189 Pneumonia, unspecified organism: Secondary | ICD-10-CM | POA: Diagnosis not present

## 2019-05-23 DIAGNOSIS — N390 Urinary tract infection, site not specified: Secondary | ICD-10-CM | POA: Diagnosis not present

## 2019-05-24 ENCOUNTER — Other Ambulatory Visit (HOSPITAL_COMMUNITY): Payer: Self-pay | Admitting: Cardiology

## 2019-05-25 ENCOUNTER — Other Ambulatory Visit (HOSPITAL_COMMUNITY): Payer: Self-pay

## 2019-05-25 NOTE — Progress Notes (Signed)
Called pt on the way to do home visit like usual, no answer on the phone, when I arrived nobody came to door either. Called again and no answer again.   Marylouise Stacks, EMT-Paramedic 05/25/19

## 2019-05-26 ENCOUNTER — Telehealth (HOSPITAL_COMMUNITY): Payer: Self-pay | Admitting: *Deleted

## 2019-05-26 ENCOUNTER — Other Ambulatory Visit (HOSPITAL_COMMUNITY): Payer: Self-pay

## 2019-05-26 ENCOUNTER — Telehealth (HOSPITAL_COMMUNITY): Payer: Self-pay | Admitting: Cardiology

## 2019-05-26 MED ORDER — TORSEMIDE 100 MG PO TABS: 100.0000 mg | ORAL_TABLET | Freq: Every day | ORAL | 3 refills | Status: DC

## 2019-05-26 MED ORDER — POTASSIUM CHLORIDE CRYS ER 20 MEQ PO TBCR: 40.0000 meq | EXTENDED_RELEASE_TABLET | Freq: Every day | ORAL | 3 refills | Status: DC

## 2019-05-26 NOTE — Telephone Encounter (Signed)
Last office note faxed per [facility] request.

## 2019-05-26 NOTE — Telephone Encounter (Signed)
Abnormal labs received from [facility] drawn 05/23/19 K 2.3 Cr 2.10 BUN 59 Na 142 Per VO [name],NP-C  Hold torsemide x 2 days, then decrease to 100 mg daily  Take 40 meq of potassium now, repeat 40 meq potassium in 2 hours, then daily 40 meq potassium there after  Repeat bmet Monday 05/30/19  Pt aware and voiced understanding  Order sent for repeat labs to [facility]

## 2019-05-26 NOTE — Progress Notes (Signed)
Paramedicine Encounter    Patient ID: Marice Angelino, male    DOB: 1946-09-17, 73 y.o.   MRN: [id number]   Patient Care Team: [facility] P[facility] - General Jettie Booze, MD as Consulting Physician (Cardiology) Sharmon Revere as Physician Assistant (Cardiology) [name], MD as Attending Physician (Internal Medicine) Thompson Grayer, MD as Consulting Physician (Clinical Cardiac Electrophysiology)  Patient Active Problem List   Diagnosis Date Noted  . Lobar pneumonia ([place]) 04/14/2019  . Hyperkalemia 04/10/2019  . Cerebral embolism with cerebral infarction 03/21/2019  . Gastritis and gastroduodenitis   . NSVT (nonsustained ventricular tachycardia) ([place]) 03/08/2019  . Tobacco abuse counseling 03/08/2019  . Iron deficiency anemia   . Acute on chronic systolic CHF (congestive heart failure) ([place]) 03/06/2019  . Diabetes mellitus type 2 in obese ([place])   . Primary osteoarthritis of right hip   . Hypomagnesemia   . Steroid-induced hyperglycemia   . Supplemental oxygen dependent   . Leukocytosis   . Hypokalemia   . Acute on chronic anemia   . Diabetic peripheral neuropathy ([place])   . Acute on chronic systolic (congestive) heart failure ([place])   . Acute on chronic respiratory failure ([place]) 01/14/2019  . Hypoxia   . Altered mental status   . Heart failure ([place]) 12/30/2018  . AKI (acute kidney injury) ([place])   . Acute respiratory failure ([place]) 11/22/2018  . Acute on chronic respiratory failure with hypoxia and hypercapnia ([place]) 11/13/2018  . Metabolic encephalopathy 74/25/9563  . Acute respiratory failure with hypoxia and hypercapnia ([place]) 07/26/2018  . Acute exacerbation of CHF (congestive heart failure) ([place]) 07/26/2018  . CHF exacerbation ([place]) 05/01/2018  . CHF (congestive heart failure) ([place]) 04/30/2018  . Chronic respiratory failure with hypoxia ([place]) 12/28/2017  . Acute encephalopathy   . Atrial fibrillation with RVR ([place])   . SVT (supraventricular  tachycardia) ([place])   . COPD GOLD 0   . Medically noncompliant   . Panlobular emphysema ([place])   . OSA (obstructive sleep apnea)   . Pulmonary hypertension ([place])   . Disorientation   . Pressure injury of skin 09/07/2016  . Acute on chronic combined systolic and diastolic CHF (congestive heart failure) ([place]) 09/05/2016  . Skin lesion-left heal 09/05/2016  . Chronic systolic CHF (congestive heart failure) ([place]) 07/16/2016  . COPD (chronic obstructive pulmonary disease) ([place]) 07/16/2016  . Uncontrolled type 2 diabetes mellitus with complication ([place])   . Diabetic polyneuropathy associated with diabetes mellitus due to underlying condition ([place])   . Normocytic anemia 06/29/2016  . CAD - Non-obstructive by LHC1/16 01/21/2016  . Nonischemic cardiomyopathy ([place]) 10/09/2015  . PAF (paroxysmal atrial fibrillation) ([place])   . Chronic pain 02/19/2015  . Morbid obesity ([place]) 02/13/2015  . Dyspnea   . Elevated troponin I level 11/01/2014  . COPD exacerbation ([place]) 11/01/2014  . Essential hypertension 10/31/2014  . Type 2 diabetes mellitus with neuropathy 10/31/2014  . Hyperlipidemia  10/31/2014  . Cigarette smoker 10/31/2014    Current Outpatient Medications:  .  albuterol (VENTOLIN HFA) 108 (90 Base) MCG/ACT inhaler, Inhale 2 puffs into the lungs every 6 (six) hours as needed for wheezing or shortness of breath., Disp: , Rfl:  .  apixaban (ELIQUIS) 5 MG TABS tablet, Take 1 tablet (5 mg total) by mouth 2 (two) times daily., Disp: 60 tablet, Rfl: 0 .  bisoprolol (ZEBETA) 5 MG tablet, Take 0.5 tablets (2.5 mg total) by mouth at bedtime., Disp: 45 tablet, Rfl: 3 .  budesonide-formoterol (SYMBICORT) 160-4.5 MCG/ACT inhaler,  Inhale 2 puffs into the lungs 2 (two) times daily., Disp: , Rfl:  .  ferrous sulfate 325 (65 FE) MG tablet, Take 1 tablet (325 mg total) by mouth 2 (two) times daily with a meal., Disp: 60 tablet, Rfl: 0 .  fluticasone (FLONASE) 50 MCG/ACT nasal spray, Place 2 sprays into both  nostrils daily as needed for allergies. , Disp: , Rfl:  .  gabapentin (NEURONTIN) 600 MG tablet, Take 1 tablet (600 mg total) by mouth 2 (two) times daily., Disp: 60 tablet, Rfl: 0 .  hydrocortisone (ANUSOL-HC) 2.5 % rectal cream, Place 1 application rectally 2 (two) times daily as needed for hemorrhoids or itching. , Disp: , Rfl:  .  hydrocortisone 1 % lotion, Apply 1 application topically See admin instructions. Apply small amount to back twice daily for itchy rash, Disp: , Rfl:  .  insulin aspart (NOVOLOG) 100 UNIT/ML injection, Inject 4 Units into the skin 3 (three) times daily with meals., Disp: 10 mL, Rfl: 0 .  insulin aspart protamine - aspart (NOVOLOG MIX 70/30 FLEXPEN) (70-30) 100 UNIT/ML FlexPen, Inject 0.1 mLs (10 Units total) into the skin 2 (two) times daily., Disp: 15 mL, Rfl: 0 .  ipratropium-albuterol (DUONEB) 0.5-2.5 (3) MG/3ML SOLN, Take 3 mLs by nebulization 2 (two) times daily as needed (shortness of breath/wheezing). , Disp: , Rfl:  .  isosorbide-hydrALAZINE (BIDIL) 20-37.5 MG tablet, Take 0.5 tablets by mouth 3 (three) times daily., Disp: 135 tablet, Rfl: 3 .  latanoprost (XALATAN) 0.005 % ophthalmic solution, Place 1 drop into both eyes at bedtime., Disp: , Rfl:  .  metolazone (ZAROXOLYN) 2.5 MG tablet, Take 1 tablet (2.5 mg total) by mouth 4 (four) times a week. Take extra Potassium 35mq when taking this medication (Patient taking differently: Take 2.5 mg by mouth 4 (four) times a week. Take extra Potassium 282m when taking this medication), Disp: 30 tablet, Rfl: 3 .  OXYGEN, Inhale 3 L into the lungs continuous. , Disp: , Rfl:  .  pantoprazole (PROTONIX) 40 MG tablet, Take 1 tablet (40 mg total) by mouth 2 (two) times daily., Disp: 60 tablet, Rfl: 0 .  polyethylene glycol (MIRALAX / GLYCOLAX) packet, Take 17 g by mouth daily. (Patient taking differently: Take 17 g by mouth daily as needed (constipation). ), Disp: 14 each, Rfl: 0 .  potassium chloride SA (K-DUR) 20 MEQ tablet,  Take 2049mwhen you take Metolazone., Disp: 90 tablet, Rfl: 3 .  sodium chloride (OCEAN) 0.65 % SOLN nasal spray, Place 2 sprays into both nostrils as needed for congestion. , Disp: , Rfl:  .  tamsulosin (FLOMAX) 0.4 MG CAPS capsule, Take 1 capsule (0.4 mg total) by mouth daily after supper., Disp: 30 capsule, Rfl: 3 .  torsemide (DEMADEX) 100 MG tablet, Take 1 tablet (100 mg total) by mouth daily for 2 days, THEN 1 tablet (100 mg total) every morning for 30 days, THEN 0.5 tablets (50 mg total) every evening. (Patient taking differently: No sig reported), Disp: 180 tablet, Rfl: 3 .  guaifenesin (HUMIBID E) 400 MG TABS tablet, Take 1 tablet (400 mg total) by mouth every 6 (six) hours as needed. (Patient not taking: Reported on 05/11/2019), Disp: 10 tablet, Rfl: 0 .  predniSONE (DELTASONE) 10 MG tablet, Take 6 tablets (60 mg total) by mouth daily with breakfast. Take 6 tabs tomorrow and then decrease by 1 tab daily until finished (Patient not taking: Reported on 05/11/2019), Disp: 21 tablet, Rfl: 0 .  rosuvastatin (CRESTOR) 20 MG tablet, Take  1 tablet (20 mg total) by mouth daily at 6 PM. (Patient not taking: Reported on 05/26/2019), Disp: 90 tablet, Rfl: 3 No Known Allergies    Social History   Socioeconomic History  . Marital status: Married    Spouse name: Not on file  . Number of children: Not on file  . Years of education: Not on file  . Highest education level: Not on file  Occupational History  . Not on file  Social Needs  . Financial resource strain: Not on file  . Food insecurity    Worry: Not on file    Inability: Not on file  . Transportation needs    Medical: Not on file    Non-medical: Not on file  Tobacco Use  . Smoking status: Former Smoker    Packs/day: 1.00    Years: 34.00    Pack years: 34.00    Types: Cigarettes    Quit date: 11/30/2018    Years since quitting: 0.4  . Smokeless tobacco: Never Used  Substance and Sexual Activity  . Alcohol use: No    Alcohol/week:  0.0 standard drinks  . Drug use: No  . Sexual activity: Not on file  Lifestyle  . Physical activity    Days per week: Not on file    Minutes per session: Not on file  . Stress: Not on file  Relationships  . Social Herbalist on phone: Not on file    Gets together: Not on file    Attends religious service: Not on file    Active member of club or organization: Not on file    Attends meetings of clubs or organizations: Not on file    Relationship status: Not on file  . Intimate partner violence    Fear of current or ex partner: Not on file    Emotionally abused: Not on file    Physically abused: Not on file    Forced sexual activity: Not on file  Other Topics Concern  . Not on file  Social History Narrative  . Not on file    Physical Exam      Future Appointments  Date Time Provider [place]  06/08/2019  2:30 PM [facility] PA/NP [facility] None    BP (!) 108/50   Pulse 82   Temp 97.9 F (36.6 C)   Resp 20   Wt 255 lb (115.7 kg)   SpO2 92%   BMI 34.58 kg/m  [facility] EMS-232--pt had just eaten 68mn pror to check  Weight yesterday-? Last visit weight-no weight last visit  Pt sitting up eating lunch when I arrived, wife said he is sitting up more and more and she thinks he is doing better.  [facility]. He is now out of the rosuvastatin--[facility] has not [facility] that yet.  Seen a note in chart that pt is being d/d from PT due to nobody answering the door or phone calls. Wife is bad about not answering the door and screening phone calls--she usually has to call me back once I leave a message.  meds verified and pill box refilled--he Is good until 8/13 for his pills.  He is out of his pantoprazole.  Pt wife is asking for the referral of PT to be sent back in. Will ask jenna to assist with that.  Wife got his meds called in and most of them are on the way already.  His weight is down, edema decreased as well. Lungs sound  terrible. He said he had productive  cough, he is not using the mucinex. Told him to try that and see if it improves and if not then he could always seek further care at his doctor. Pt denies increased sob, no dizziness, no c/p.    Marylouise Stacks, [facility] Paramedic  05/26/19

## 2019-05-27 ENCOUNTER — Telehealth (HOSPITAL_COMMUNITY): Payer: Self-pay

## 2019-05-27 ENCOUNTER — Telehealth (HOSPITAL_COMMUNITY): Payer: Self-pay | Admitting: Licensed Clinical Social Worker

## 2019-05-27 NOTE — Telephone Encounter (Signed)
[place] nurse called to state pt would be discharged from [facility] due to non compliance. Pt does not answer phone calls or answer the door for visits very frequently. Nurse is also concerned about elevated Co2 levels with lab work, which is due to severe COPD. Pt placed on hospice recently and is followed by [facility] paramed. No changes at this time.

## 2019-05-27 NOTE — Telephone Encounter (Signed)
CSW informed by [employer] that pt was DC'd from PT due to noncompliance but is no requesting that PT come back out.    CSW called and discussed with [facility]- they would need a new order from an MD in order to reconsider services for the patient.  CSW will discuss with team and follow up as needed  Jorge Ny, [facility] Desk#: [phone] Cell#: [phone]

## 2019-05-30 DIAGNOSIS — J449 Chronic obstructive pulmonary disease, unspecified: Secondary | ICD-10-CM | POA: Diagnosis not present

## 2019-05-30 DIAGNOSIS — J189 Pneumonia, unspecified organism: Secondary | ICD-10-CM | POA: Diagnosis not present

## 2019-05-30 DIAGNOSIS — A419 Sepsis, unspecified organism: Secondary | ICD-10-CM | POA: Diagnosis not present

## 2019-05-30 DIAGNOSIS — J440 Chronic obstructive pulmonary disease with acute lower respiratory infection: Secondary | ICD-10-CM | POA: Diagnosis not present

## 2019-05-30 DIAGNOSIS — N390 Urinary tract infection, site not specified: Secondary | ICD-10-CM | POA: Diagnosis not present

## 2019-05-30 DIAGNOSIS — E119 Type 2 diabetes mellitus without complications: Secondary | ICD-10-CM | POA: Diagnosis not present

## 2019-05-30 DIAGNOSIS — B9689 Other specified bacterial agents as the cause of diseases classified elsewhere: Secondary | ICD-10-CM | POA: Diagnosis not present

## 2019-05-30 DIAGNOSIS — J441 Chronic obstructive pulmonary disease with (acute) exacerbation: Secondary | ICD-10-CM | POA: Diagnosis not present

## 2019-06-01 ENCOUNTER — Other Ambulatory Visit (HOSPITAL_COMMUNITY): Payer: Self-pay | Admitting: Cardiology

## 2019-06-01 ENCOUNTER — Other Ambulatory Visit (HOSPITAL_COMMUNITY): Payer: Self-pay

## 2019-06-01 ENCOUNTER — Telehealth (HOSPITAL_COMMUNITY): Payer: Self-pay | Admitting: Licensed Clinical Social Worker

## 2019-06-01 NOTE — Telephone Encounter (Signed)
CSW informed by community paramedic that pt was receiving home health PT and RN but they stopped coming out because the patient was not keeping his appts.  CSW called [facility] where pt was getting services through and they confirmed that they were dropping due to noncompliance- CSW informed them of pt request for them to attempt to see him again- they are unwilling to set up new services without a new order.  Pt has PCP through the [place].  [facility] and [facility] with pt P[facility] to place home health orders.  CSW called pt and left message with update  CSW will continue to follow and assist as needed  Jorge Ny, [facility] Desk#: [phone] Cell#: [phone]

## 2019-06-01 NOTE — Progress Notes (Addendum)
Came out today for med check, there were some med changes last week from lab appoint. The home health agency came Monday to draw labs but did not fax to clinic yet. Nurse called home health and they will fax over results and amy will review them. Wife had to fix potassium dosing last week so I will check that.  That was correct in the pill box, will see him again next week in clinic. Clinic will call if there are any further med changes.   -clinic called and no changes need to be made at this time   Marylouise Stacks, EMT-Paramedic  06/01/19

## 2019-06-02 ENCOUNTER — Telehealth (HOSPITAL_COMMUNITY): Payer: Self-pay | Admitting: Surgery

## 2019-06-02 IMAGING — DX DG CHEST 2V
2 series · 2 of 2 positions shown · non-contrast
Comparison: Chest x-ray of December 16, 2016

CLINICAL DATA: Chronic cough and shortness of breath for the past
year with increasing severity. History of coronary artery disease,
CHF, COPD, current smoker.

EXAM:
CHEST  2 VIEW

[chest pa]
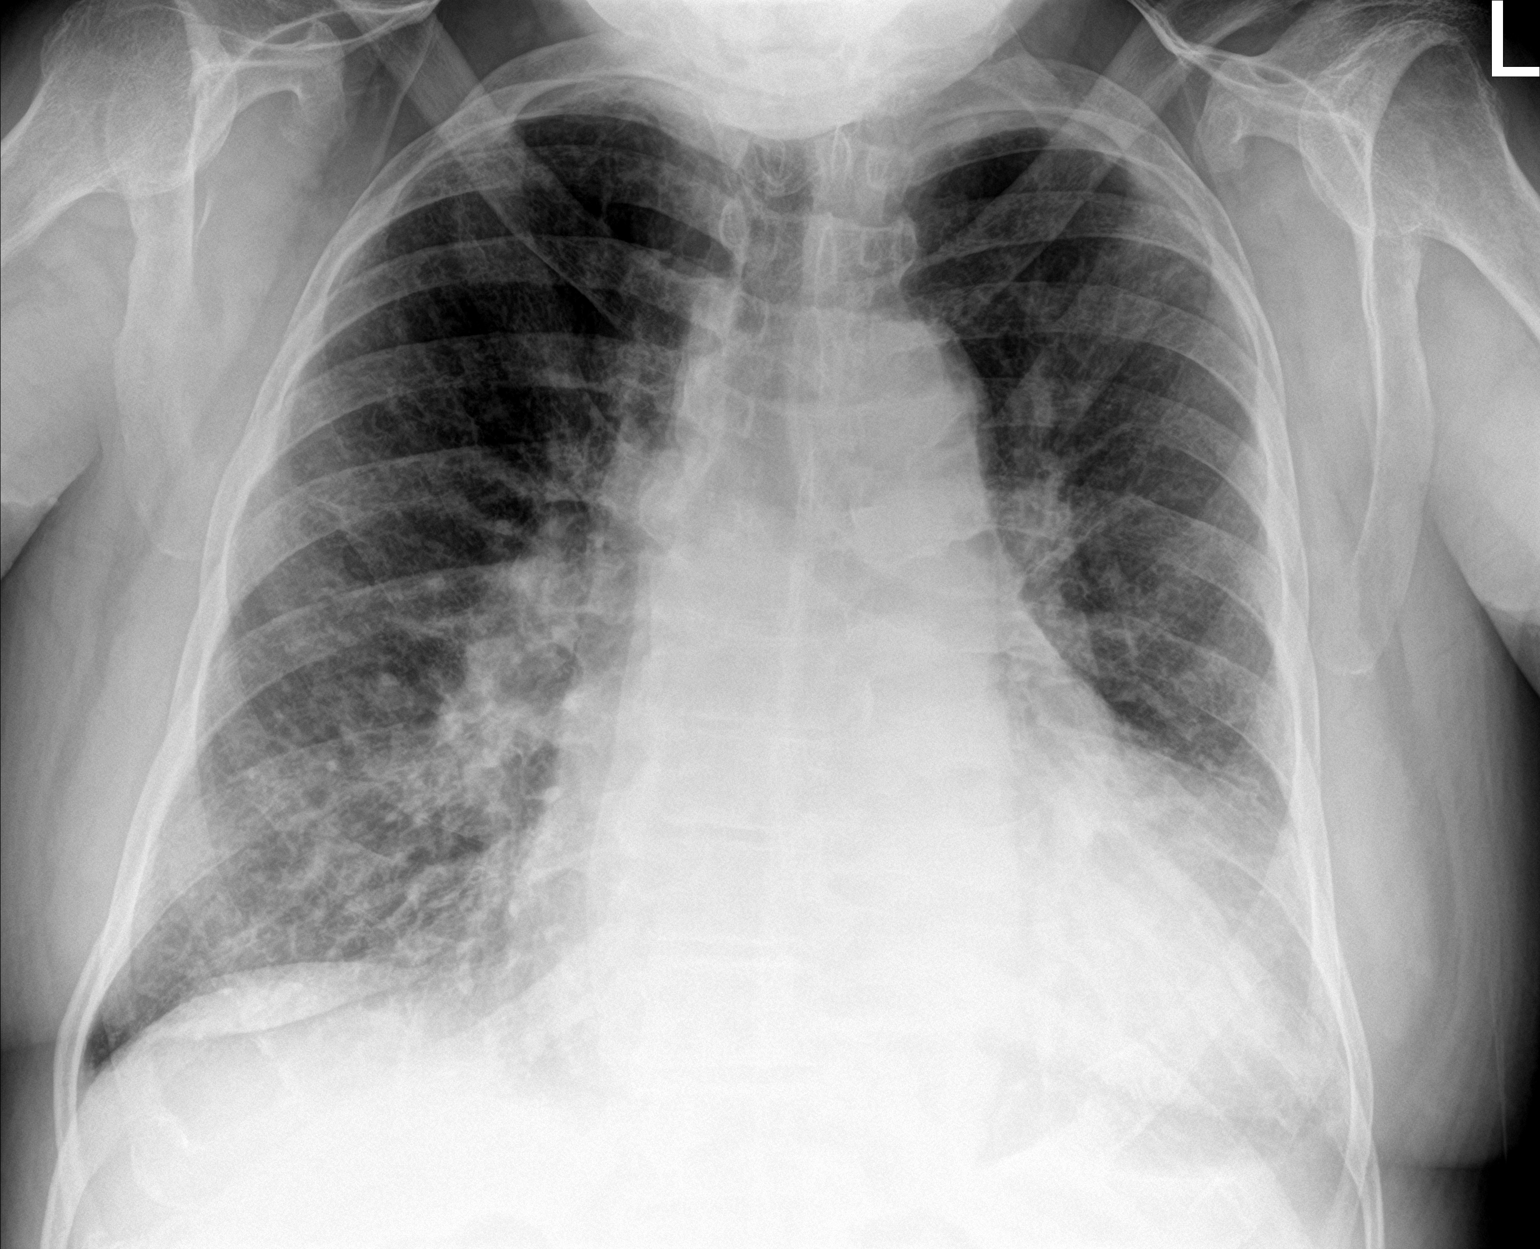

[chest lat]
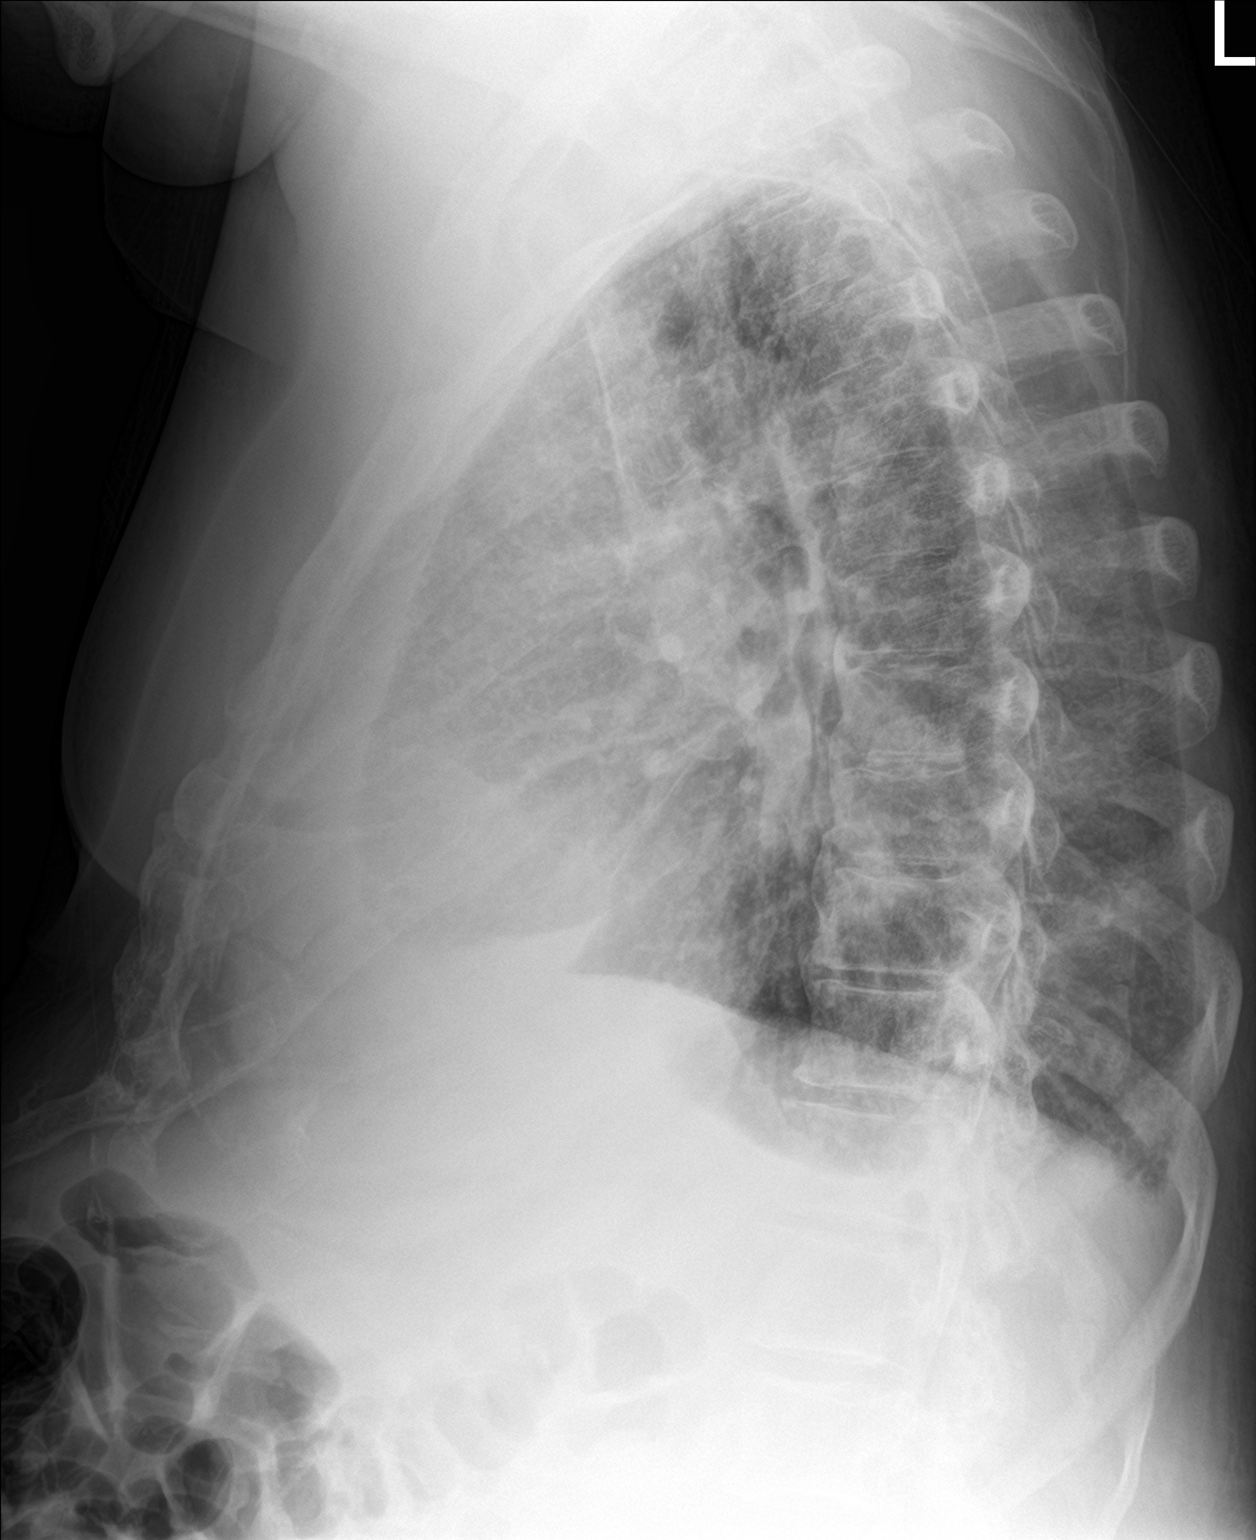

[2 of 2 positions shown; findings below may reference images not displayed]

FINDINGS: The lungs are well-expanded. The interstitial markings are mildly
increased though stable. The cardiac silhouette is enlarged. The
pulmonary vascularity is engorged. There is a small left pleural
effusion.
IMPRESSION: CHF with mild interstitial edema and small left pleural effusion.
The appearance of the chest has improved somewhat since the previous
study. No definite acute pneumonia.

## 2019-06-02 MED ORDER — POTASSIUM CHLORIDE CRYS ER 20 MEQ PO TBCR: 40.0000 meq | EXTENDED_RELEASE_TABLET | Freq: Two times a day (BID) | ORAL | 3 refills | Status: DC

## 2019-06-02 MED ORDER — TORSEMIDE 20 MG PO TABS: 80.0000 mg | ORAL_TABLET | Freq: Two times a day (BID) | ORAL | 3 refills | Status: DC

## 2019-06-02 NOTE — Telephone Encounter (Signed)
Prescriptions sent to pharmacy of choice per wife.

## 2019-06-02 NOTE — Telephone Encounter (Signed)
Attempted to contact patient in reference to his blood work results from yesterday.  I was unable to reach him, yet did leave a message for a return call.  I called Marylouise Stacks-- [facility] Community Paramedic to have her see patient and make necessary changes in his pillbox.  Left a message for her to return call as well.  Will reach out to her again if no call back.

## 2019-06-02 NOTE — Telephone Encounter (Signed)
Spoke to patients wife and instructed her per Darrick Grinder NP to increase Potassium to 40 meq twice daily and Decrease Torsemide to 80 mg twice daily.  I will enter order for labs to be repeated in 7 days by [facility].  I will also make sure that [facility] Paramedic aware of medication changes.

## 2019-06-03 ENCOUNTER — Other Ambulatory Visit (HOSPITAL_COMMUNITY): Payer: Self-pay | Admitting: Cardiology

## 2019-06-06 ENCOUNTER — Telehealth (HOSPITAL_COMMUNITY): Payer: Self-pay

## 2019-06-06 NOTE — Telephone Encounter (Signed)
I called pt to see if I could come out to verify med changes from last week, pt states she placed it in pill box already and verbalized to me that it was potassium 22mq BID and then torsemide down to 819mBID. She then told me she removed the 10022mnd 41m58mbs of torsemide from his pill box so no visit was necessary. Last week when I checked the previous med changes pts wife had done they were correct as well.   KatiMarylouise StacksT-Paramedic  06/06/19

## 2019-06-08 ENCOUNTER — Other Ambulatory Visit: Payer: Self-pay

## 2019-06-08 ENCOUNTER — Telehealth (HOSPITAL_COMMUNITY): Payer: Self-pay | Admitting: Licensed Clinical Social Worker

## 2019-06-08 ENCOUNTER — Inpatient Hospital Stay (HOSPITAL_COMMUNITY)
Admission: EM | Admit: 2019-06-08 | Discharge: 2019-06-13 | DRG: 291 | Disposition: A | Discharge status: Home Health | Payer: Medicare Other | Attending: Internal Medicine | Admitting: Internal Medicine

## 2019-06-08 ENCOUNTER — Emergency Department (HOSPITAL_COMMUNITY): Payer: Medicare Other

## 2019-06-08 ENCOUNTER — Observation Stay (HOSPITAL_COMMUNITY)
Admission: RE | Admit: 2019-06-08 | Discharge: 2019-06-08 | Disposition: A | Discharge status: Home / Self Care | Payer: Medicare Other | Source: Ambulatory Visit | Attending: Internal Medicine | Admitting: Internal Medicine

## 2019-06-08 ENCOUNTER — Encounter (HOSPITAL_COMMUNITY): Payer: Self-pay | Admitting: Emergency Medicine

## 2019-06-08 DIAGNOSIS — E1142 Type 2 diabetes mellitus with diabetic polyneuropathy: Secondary | ICD-10-CM | POA: Diagnosis present

## 2019-06-08 DIAGNOSIS — E662 Morbid (severe) obesity with alveolar hypoventilation: Secondary | ICD-10-CM | POA: Diagnosis present

## 2019-06-08 DIAGNOSIS — J9621 Acute and chronic respiratory failure with hypoxia: Secondary | ICD-10-CM | POA: Diagnosis not present

## 2019-06-08 DIAGNOSIS — Z9081 Acquired absence of spleen: Secondary | ICD-10-CM

## 2019-06-08 DIAGNOSIS — Z9981 Dependence on supplemental oxygen: Secondary | ICD-10-CM

## 2019-06-08 DIAGNOSIS — Z79899 Other long term (current) drug therapy: Secondary | ICD-10-CM

## 2019-06-08 DIAGNOSIS — J9622 Acute and chronic respiratory failure with hypercapnia: Secondary | ICD-10-CM | POA: Diagnosis not present

## 2019-06-08 DIAGNOSIS — I110 Hypertensive heart disease with heart failure: Secondary | ICD-10-CM | POA: Diagnosis not present

## 2019-06-08 DIAGNOSIS — R5381 Other malaise: Secondary | ICD-10-CM | POA: Diagnosis present

## 2019-06-08 DIAGNOSIS — Z8249 Family history of ischemic heart disease and other diseases of the circulatory system: Secondary | ICD-10-CM

## 2019-06-08 DIAGNOSIS — J441 Chronic obstructive pulmonary disease with (acute) exacerbation: Secondary | ICD-10-CM

## 2019-06-08 DIAGNOSIS — R079 Chest pain, unspecified: Secondary | ICD-10-CM | POA: Diagnosis not present

## 2019-06-08 DIAGNOSIS — I130 Hypertensive heart and chronic kidney disease with heart failure and stage 1 through stage 4 chronic kidney disease, or unspecified chronic kidney disease: Secondary | ICD-10-CM | POA: Diagnosis not present

## 2019-06-08 DIAGNOSIS — R9431 Abnormal electrocardiogram [ECG] [EKG]: Secondary | ICD-10-CM

## 2019-06-08 DIAGNOSIS — E1122 Type 2 diabetes mellitus with diabetic chronic kidney disease: Secondary | ICD-10-CM | POA: Diagnosis present

## 2019-06-08 DIAGNOSIS — E7800 Pure hypercholesterolemia, unspecified: Secondary | ICD-10-CM | POA: Diagnosis present

## 2019-06-08 DIAGNOSIS — R0602 Shortness of breath: Secondary | ICD-10-CM | POA: Diagnosis not present

## 2019-06-08 DIAGNOSIS — I480 Paroxysmal atrial fibrillation: Secondary | ICD-10-CM | POA: Diagnosis present

## 2019-06-08 DIAGNOSIS — I272 Pulmonary hypertension, unspecified: Secondary | ICD-10-CM | POA: Diagnosis present

## 2019-06-08 DIAGNOSIS — Z87891 Personal history of nicotine dependence: Secondary | ICD-10-CM

## 2019-06-08 DIAGNOSIS — N179 Acute kidney failure, unspecified: Secondary | ICD-10-CM | POA: Diagnosis present

## 2019-06-08 DIAGNOSIS — E785 Hyperlipidemia, unspecified: Secondary | ICD-10-CM | POA: Diagnosis present

## 2019-06-08 DIAGNOSIS — I248 Other forms of acute ischemic heart disease: Secondary | ICD-10-CM | POA: Diagnosis present

## 2019-06-08 DIAGNOSIS — Z7951 Long term (current) use of inhaled steroids: Secondary | ICD-10-CM

## 2019-06-08 DIAGNOSIS — Z794 Long term (current) use of insulin: Secondary | ICD-10-CM

## 2019-06-08 DIAGNOSIS — I5023 Acute on chronic systolic (congestive) heart failure: Secondary | ICD-10-CM | POA: Diagnosis not present

## 2019-06-08 DIAGNOSIS — G8929 Other chronic pain: Secondary | ICD-10-CM | POA: Diagnosis present

## 2019-06-08 DIAGNOSIS — I493 Ventricular premature depolarization: Secondary | ICD-10-CM | POA: Diagnosis present

## 2019-06-08 DIAGNOSIS — J431 Panlobular emphysema: Secondary | ICD-10-CM | POA: Diagnosis present

## 2019-06-08 DIAGNOSIS — Z20828 Contact with and (suspected) exposure to other viral communicable diseases: Secondary | ICD-10-CM | POA: Diagnosis present

## 2019-06-08 DIAGNOSIS — E1140 Type 2 diabetes mellitus with diabetic neuropathy, unspecified: Secondary | ICD-10-CM | POA: Diagnosis present

## 2019-06-08 DIAGNOSIS — N183 Chronic kidney disease, stage 3 (moderate): Secondary | ICD-10-CM | POA: Diagnosis present

## 2019-06-08 DIAGNOSIS — Z8673 Personal history of transient ischemic attack (TIA), and cerebral infarction without residual deficits: Secondary | ICD-10-CM

## 2019-06-08 DIAGNOSIS — E876 Hypokalemia: Secondary | ICD-10-CM

## 2019-06-08 DIAGNOSIS — G4733 Obstructive sleep apnea (adult) (pediatric): Secondary | ICD-10-CM | POA: Diagnosis present

## 2019-06-08 DIAGNOSIS — R42 Dizziness and giddiness: Secondary | ICD-10-CM | POA: Diagnosis not present

## 2019-06-08 DIAGNOSIS — R609 Edema, unspecified: Secondary | ICD-10-CM

## 2019-06-08 DIAGNOSIS — R0902 Hypoxemia: Secondary | ICD-10-CM | POA: Diagnosis not present

## 2019-06-08 DIAGNOSIS — I959 Hypotension, unspecified: Secondary | ICD-10-CM | POA: Diagnosis not present

## 2019-06-08 DIAGNOSIS — D631 Anemia in chronic kidney disease: Secondary | ICD-10-CM | POA: Diagnosis present

## 2019-06-08 DIAGNOSIS — I10 Essential (primary) hypertension: Secondary | ICD-10-CM | POA: Diagnosis present

## 2019-06-08 DIAGNOSIS — R062 Wheezing: Secondary | ICD-10-CM | POA: Diagnosis not present

## 2019-06-08 DIAGNOSIS — I509 Heart failure, unspecified: Secondary | ICD-10-CM

## 2019-06-08 DIAGNOSIS — I2510 Atherosclerotic heart disease of native coronary artery without angina pectoris: Secondary | ICD-10-CM | POA: Diagnosis present

## 2019-06-08 DIAGNOSIS — Z7901 Long term (current) use of anticoagulants: Secondary | ICD-10-CM

## 2019-06-08 DIAGNOSIS — I428 Other cardiomyopathies: Secondary | ICD-10-CM | POA: Diagnosis present

## 2019-06-08 DIAGNOSIS — Z5320 Procedure and treatment not carried out because of patient's decision for unspecified reasons: Secondary | ICD-10-CM | POA: Diagnosis not present

## 2019-06-08 DIAGNOSIS — I4892 Unspecified atrial flutter: Secondary | ICD-10-CM | POA: Diagnosis present

## 2019-06-08 DIAGNOSIS — E1169 Type 2 diabetes mellitus with other specified complication: Secondary | ICD-10-CM | POA: Diagnosis present

## 2019-06-08 DIAGNOSIS — J449 Chronic obstructive pulmonary disease, unspecified: Secondary | ICD-10-CM | POA: Diagnosis present

## 2019-06-08 DIAGNOSIS — Z6835 Body mass index (BMI) 35.0-35.9, adult: Secondary | ICD-10-CM

## 2019-06-08 LAB — BASIC METABOLIC PANEL
Anion gap: 13 (ref 5–15)
BUN: 56 mg/dL — ABNORMAL HIGH (ref 8–23)
CO2: 34 mmol/L — ABNORMAL HIGH (ref 22–32)
Calcium: 8.8 mg/dL — ABNORMAL LOW (ref 8.9–10.3)
Chloride: 90 mmol/L — ABNORMAL LOW (ref 98–111)
Creatinine, Ser: 2.25 mg/dL — ABNORMAL HIGH (ref 0.61–1.24)
GFR calc Af Amer: 33 mL/min — ABNORMAL LOW (ref >60)
GFR calc non Af Amer: 28 mL/min — ABNORMAL LOW (ref >60)
Glucose, Bld: 224 mg/dL — ABNORMAL HIGH (ref 70–99)
Potassium: 4 mmol/L (ref 3.5–5.1)
Sodium: 137 mmol/L (ref 135–145)

## 2019-06-08 LAB — URINALYSIS, ROUTINE W REFLEX MICROSCOPIC
Bilirubin Urine: NEGATIVE
Glucose, UA: NEGATIVE mg/dL
Hgb urine dipstick: NEGATIVE
Ketones, ur: NEGATIVE mg/dL
Leukocytes,Ua: NEGATIVE
Nitrite: NEGATIVE
Protein, ur: NEGATIVE mg/dL
Specific Gravity, Urine: 1.009 (ref 1.005–1.030)
pH: 6 (ref 5.0–8.0)

## 2019-06-08 LAB — GLUCOSE, CAPILLARY
Glucose-Capillary: 205 mg/dL — ABNORMAL HIGH (ref 70–99)
Glucose-Capillary: 326 mg/dL — ABNORMAL HIGH (ref 70–99)
Glucose-Capillary: 289 mg/dL — ABNORMAL HIGH (ref 70–99)

## 2019-06-08 LAB — CBC WITH DIFFERENTIAL/PLATELET
Abs Immature Granulocytes: 0.02 10*3/uL (ref 0.00–0.07)
Basophils Absolute: 0.1 10*3/uL (ref 0.0–0.1)
Basophils Relative: 1 %
Eosinophils Absolute: 0.2 10*3/uL (ref 0.0–0.5)
Eosinophils Relative: 2 %
HCT: 35.2 % — ABNORMAL LOW (ref 39.0–52.0)
Hemoglobin: 10.1 g/dL — ABNORMAL LOW (ref 13.0–17.0)
Immature Granulocytes: 0 %
Lymphocytes Relative: 15 %
Lymphs Abs: 0.9 10*3/uL (ref 0.7–4.0)
MCH: 25.8 pg — ABNORMAL LOW (ref 26.0–34.0)
MCHC: 28.7 g/dL — ABNORMAL LOW (ref 30.0–36.0)
MCV: 89.8 fL (ref 80.0–100.0)
Monocytes Absolute: 0.9 10*3/uL (ref 0.1–1.0)
Monocytes Relative: 14 %
Neutro Abs: 4.3 10*3/uL (ref 1.7–7.7)
Neutrophils Relative %: 68 %
Platelets: 373 10*3/uL (ref 150–400)
RBC: 3.92 MIL/uL — ABNORMAL LOW (ref 4.22–5.81)
RDW: 18.6 % — ABNORMAL HIGH (ref 11.5–15.5)
WBC: 6.3 10*3/uL (ref 4.0–10.5)
nRBC: 0 % (ref 0.0–0.2)

## 2019-06-08 LAB — SARS CORONAVIRUS 2 (TAT 6-24 HRS): SARS Coronavirus 2: NEGATIVE

## 2019-06-08 LAB — BRAIN NATRIURETIC PEPTIDE: B Natriuretic Peptide: 2301.2 pg/mL — ABNORMAL HIGH (ref 0.0–100.0)

## 2019-06-08 LAB — TROPONIN I (HIGH SENSITIVITY)
Troponin I (High Sensitivity): 58 ng/L — ABNORMAL HIGH (ref <18)
Troponin I (High Sensitivity): 59 ng/L — ABNORMAL HIGH (ref <18)

## 2019-06-08 LAB — LACTIC ACID, PLASMA
Lactic Acid, Venous: 1.6 mmol/L (ref 0.5–1.9)
Lactic Acid, Venous: 1.7 mmol/L (ref 0.5–1.9)

## 2019-06-08 MED ORDER — POLYETHYLENE GLYCOL 3350 17 G PO PACK
17.0000 g | PACK | Freq: Every day | ORAL | Status: DC | PRN
  Administered 2019-06-08 – 2019-06-10 (×2): 17 g via ORAL
  Filled 2019-06-08 (×3): qty 1

## 2019-06-08 MED ORDER — FUROSEMIDE 10 MG/ML IJ SOLN
80.0000 mg | Freq: Once | INTRAMUSCULAR | Status: AC
  Administered 2019-06-08: 80 mg via INTRAVENOUS
  Filled 2019-06-08: qty 8

## 2019-06-08 MED ORDER — FUROSEMIDE 10 MG/ML IJ SOLN
40.0000 mg | Freq: Two times a day (BID) | INTRAMUSCULAR | Status: DC
  Administered 2019-06-08: 40 mg via INTRAVENOUS
  Filled 2019-06-08: qty 4

## 2019-06-08 MED ORDER — BISOPROLOL FUMARATE 5 MG PO TABS
2.5000 mg | ORAL_TABLET | Freq: Every day | ORAL | Status: DC
  Administered 2019-06-09: 2.5 mg via ORAL
  Filled 2019-06-08: qty 0.5
  Filled 2019-06-08: qty 1

## 2019-06-08 MED ORDER — PANTOPRAZOLE SODIUM 40 MG PO TBEC
40.0000 mg | DELAYED_RELEASE_TABLET | Freq: Two times a day (BID) | ORAL | Status: DC
  Administered 2019-06-08: 40 mg via ORAL
  Filled 2019-06-08: qty 1

## 2019-06-08 MED ORDER — OXYCODONE-ACETAMINOPHEN 5-325 MG PO TABS
1.0000 | ORAL_TABLET | Freq: Four times a day (QID) | ORAL | Status: DC | PRN
  Administered 2019-06-08 – 2019-06-10 (×7): 1 via ORAL
  Filled 2019-06-08 (×7): qty 1

## 2019-06-08 MED ORDER — OXYCODONE-ACETAMINOPHEN 10-325 MG PO TABS: 1.0000 | ORAL_TABLET | Freq: Four times a day (QID) | ORAL | Status: DC | PRN

## 2019-06-08 MED ORDER — INSULIN ASPART PROT & ASPART (70-30 MIX) 100 UNIT/ML PEN: 10.0000 [IU] | PEN_INJECTOR | Freq: Two times a day (BID) | SUBCUTANEOUS | Status: DC

## 2019-06-08 MED ORDER — ALUM & MAG HYDROXIDE-SIMETH 200-200-20 MG/5ML PO SUSP
30.0000 mL | Freq: Once | ORAL | Status: AC
  Administered 2019-06-08: 30 mL via ORAL
  Filled 2019-06-08: qty 30

## 2019-06-08 MED ORDER — SODIUM CHLORIDE 0.9% FLUSH: 3.0000 mL | INTRAVENOUS | Status: DC | PRN

## 2019-06-08 MED ORDER — SODIUM CHLORIDE 0.9 % IV SOLN: 250.0000 mL | INTRAVENOUS | Status: DC | PRN

## 2019-06-08 MED ORDER — ONDANSETRON HCL 4 MG/2ML IJ SOLN: 4.0000 mg | Freq: Four times a day (QID) | INTRAMUSCULAR | Status: DC | PRN

## 2019-06-08 MED ORDER — ASPIRIN EC 81 MG PO TBEC
81.0000 mg | DELAYED_RELEASE_TABLET | Freq: Every day | ORAL | Status: DC
  Administered 2019-06-08 – 2019-06-13 (×6): 81 mg via ORAL
  Filled 2019-06-08 (×6): qty 1

## 2019-06-08 MED ORDER — OXYCODONE HCL 5 MG PO TABS
5.0000 mg | ORAL_TABLET | Freq: Four times a day (QID) | ORAL | Status: DC | PRN
  Administered 2019-06-08 – 2019-06-10 (×7): 5 mg via ORAL
  Filled 2019-06-08 (×7): qty 1

## 2019-06-08 MED ORDER — IPRATROPIUM-ALBUTEROL 0.5-2.5 (3) MG/3ML IN SOLN: 3.0000 mL | Freq: Two times a day (BID) | RESPIRATORY_TRACT | Status: DC | PRN

## 2019-06-08 MED ORDER — ROSUVASTATIN CALCIUM 20 MG PO TABS
20.0000 mg | ORAL_TABLET | Freq: Every day | ORAL | Status: DC
  Administered 2019-06-08 – 2019-06-12 (×5): 20 mg via ORAL
  Filled 2019-06-08 (×5): qty 1

## 2019-06-08 MED ORDER — SODIUM CHLORIDE 0.9% FLUSH
3.0000 mL | Freq: Two times a day (BID) | INTRAVENOUS | Status: DC
  Administered 2019-06-09 – 2019-06-13 (×10): 3 mL via INTRAVENOUS

## 2019-06-08 MED ORDER — APIXABAN 5 MG PO TABS
5.0000 mg | ORAL_TABLET | Freq: Two times a day (BID) | ORAL | Status: DC
  Administered 2019-06-08 – 2019-06-13 (×10): 5 mg via ORAL
  Filled 2019-06-08 (×12): qty 1

## 2019-06-08 MED ORDER — GABAPENTIN 300 MG PO CAPS
300.0000 mg | ORAL_CAPSULE | Freq: Two times a day (BID) | ORAL | Status: DC
  Administered 2019-06-08 – 2019-06-13 (×11): 300 mg via ORAL
  Filled 2019-06-08 (×11): qty 1

## 2019-06-08 MED ORDER — TAMSULOSIN HCL 0.4 MG PO CAPS
0.4000 mg | ORAL_CAPSULE | Freq: Every day | ORAL | Status: DC
  Administered 2019-06-08: 0.4 mg via ORAL
  Filled 2019-06-08: qty 1

## 2019-06-08 MED ORDER — ACETAMINOPHEN 325 MG PO TABS
650.0000 mg | ORAL_TABLET | ORAL | Status: DC | PRN
  Administered 2019-06-08: 650 mg via ORAL
  Filled 2019-06-08: qty 2

## 2019-06-08 MED ORDER — INSULIN ASPART 100 UNIT/ML ~~LOC~~ SOLN
0.0000 [IU] | Freq: Three times a day (TID) | SUBCUTANEOUS | Status: DC
  Administered 2019-06-08: 5 [IU] via SUBCUTANEOUS
  Administered 2019-06-08: 11 [IU] via SUBCUTANEOUS
  Administered 2019-06-09: 2 [IU] via SUBCUTANEOUS
  Administered 2019-06-09 – 2019-06-10 (×2): 3 [IU] via SUBCUTANEOUS
  Administered 2019-06-10: 5 [IU] via SUBCUTANEOUS
  Administered 2019-06-11: 2 [IU] via SUBCUTANEOUS
  Administered 2019-06-11: 3 [IU] via SUBCUTANEOUS
  Administered 2019-06-12: 2 [IU] via SUBCUTANEOUS
  Administered 2019-06-12 – 2019-06-13 (×3): 3 [IU] via SUBCUTANEOUS
  Administered 2019-06-13: 2 [IU] via SUBCUTANEOUS

## 2019-06-08 MED ORDER — INSULIN ASPART PROT & ASPART (70-30 MIX) 100 UNIT/ML ~~LOC~~ SUSP
10.0000 [IU] | Freq: Two times a day (BID) | SUBCUTANEOUS | Status: DC
  Administered 2019-06-08 – 2019-06-13 (×9): 10 [IU] via SUBCUTANEOUS
  Filled 2019-06-08: qty 10

## 2019-06-08 MED ORDER — ALBUTEROL SULFATE (2.5 MG/3ML) 0.083% IN NEBU
3.0000 mL | INHALATION_SOLUTION | Freq: Four times a day (QID) | RESPIRATORY_TRACT | Status: DC | PRN
  Administered 2019-06-13: 3 mL via RESPIRATORY_TRACT
  Filled 2019-06-08: qty 3

## 2019-06-08 MED ORDER — HYDROCORTISONE 2.5 % RE CREA
1.0000 "application " | TOPICAL_CREAM | Freq: Two times a day (BID) | RECTAL | Status: DC | PRN
  Filled 2019-06-08: qty 28.35

## 2019-06-08 MED ORDER — MOMETASONE FURO-FORMOTEROL FUM 200-5 MCG/ACT IN AERO
2.0000 | INHALATION_SPRAY | Freq: Two times a day (BID) | RESPIRATORY_TRACT | Status: DC
  Filled 2019-06-08: qty 8.8

## 2019-06-08 MED ORDER — ISOSORB DINITRATE-HYDRALAZINE 20-37.5 MG PO TABS
0.5000 | ORAL_TABLET | Freq: Three times a day (TID) | ORAL | Status: DC
  Administered 2019-06-08 – 2019-06-13 (×15): 0.5 via ORAL
  Filled 2019-06-08: qty 1
  Filled 2019-06-08: qty 0.5
  Filled 2019-06-08 (×6): qty 1
  Filled 2019-06-08: qty 0.5
  Filled 2019-06-08 (×2): qty 1
  Filled 2019-06-08: qty 0.5
  Filled 2019-06-08 (×6): qty 1

## 2019-06-08 MED ORDER — INSULIN ASPART 100 UNIT/ML ~~LOC~~ SOLN
0.0000 [IU] | Freq: Every day | SUBCUTANEOUS | Status: DC
  Administered 2019-06-09: 3 [IU] via SUBCUTANEOUS
  Administered 2019-06-10: 2 [IU] via SUBCUTANEOUS

## 2019-06-08 MED ORDER — MOMETASONE FURO-FORMOTEROL FUM 200-5 MCG/ACT IN AERO
2.0000 | INHALATION_SPRAY | Freq: Two times a day (BID) | RESPIRATORY_TRACT | Status: DC
  Administered 2019-06-09 – 2019-06-13 (×8): 2 via RESPIRATORY_TRACT
  Filled 2019-06-08: qty 8.8

## 2019-06-08 NOTE — ED Triage Notes (Signed)
Pt presents to ED from home BIB [facility]. Pt c/o SOB x3d. Pt on chronic [place] at home. EMS reports upon arrival pt sat 90% on home O2.  Pt says he tried nebs at home w/ no relief. During triage pt also c/o no bowel movement for 5-6d and cannot urinate for 1.5d.

## 2019-06-08 NOTE — H&P (Signed)
History and Physical    Nicholas Caldwell LTJ:[id number] DOB: [birth year] DOA: 06/08/2019  PCP: [facility] Consultants:  [place] - cardiology; [place] - GI Patient coming from:  Home - lives with wife; NOK: Wife  Chief Complaint: SOB  HPI: Nicholas Caldwell is a 73 y.o. male with medical history significant of afib; chronic systolic CHF; HTN; HLD; DM: COPD on 3L home O2; CAD; and chronic pain presenting with SOB.  The patient was somnolent at the time of my evaluation (wife reports that he stays up late every night and sleeps until noon daily) and so was unable/unwilling to answer questions.  He was somnolent but in NAD.  HPI per Dr. Dina Rich: Patient reports 3-day history of worsening shortness of breath. He reports orthopnea. He has had occasional chest pain but no chest pain right now. He at baseline is on 3 L of nasal cannula while at home. He denies any recent changes in medication and reports compliance with medication. He states he has had difficulty urinating and has not urinated in over 24 hours. He also reports 4 to 5-day history of no bowel movement. Denies any abdominal pain. Denies fever or cough. He has been using his inhaler at home with minimal relief. He has noted lower extremity swelling and weight gain.    ED Course: [place]ry[place], per Dr. Alcario Drought:  73 yo M with SOB for past few days, looks like CHF, has AKI with creat 2.25 up from mid ones. CXR pulm edema. Probably needs diuresis with monitoring.  Review of Systems: As per HPI; otherwise review of systems reviewed and negative.  Limited by patient's somnolence.   Past Medical History:  Diagnosis Date   Atrial flutter ([place])    a. recurrent AFlutter with RVR;  b. Amiodarone Rx started 4/16   CAD (coronary artery disease)    a. LHC 1/16:  mLAD diffuse disease, pLCx mild disease, dLCx with disease but too small for PCI, RCA ok, EF 25-30%   Chronic pain    Chronic systolic CHF (congestive heart failure) (HCC)     COPD (chronic obstructive pulmonary disease) ([employer])    Diabetes mellitus without complication ([place])    Hypercholesteremia    Hypertension    NICM (nonischemic cardiomyopathy) ([place])    a.dx 2016. b. 2D echo 06/2016 - Last echo 07/01/16: mod dilated LV, mod LVH, EF 25-30%, mild-mod MR, sev LAE, mild-mod reduced RV systolic function, mild-mod TR, PASP 59mHG.   PAF (paroxysmal atrial fibrillation) (HCC)    On amio - ot a candidate for flecainide due to cardiomyopathy, not a candidate for Tikosyn due to prolonged QT, and felt to be a poor candidate for ablation given left atrial size.   [place]ulmonary [place]    Tobacco abuse     Past Surgical History:  Procedure Laterality Date   BIOPSY  03/11/2019   Procedure: BIOPSY;  Surgeon: CLavena Bullion DO;  [place] [facility]  Service: Gastroenterology;;   BIOPSY  03/15/2019   Procedure: BIOPSY;  Surgeon: MIrving Copas, MD;  [place] [facility]  Service: Gastroenterology;;   CARDIOVERSION N/A 07/30/2018   Procedure: CARDIOVERSION;  Surgeon: MLarey Dresser MD;  [facility];  Service: Cardiovascular;  Laterality: N/A;   COLONOSCOPY N/A 03/11/2019   Procedure: COLONOSCOPY;  Surgeon: CLavena Bullion DO;  [place]  Service: Gastroenterology;  Laterality: N/A;   ENTEROSCOPY N/A 03/15/2019   Procedure: ENTEROSCOPY;  Surgeon: MRush LandmarkGTelford Nab, MD;  [place]  Service: Gastroenterology;  Laterality: N/A;  ESOPHAGOGASTRODUODENOSCOPY Left 03/11/2019   Procedure: ESOPHAGOGASTRODUODENOSCOPY (EGD);  Surgeon: Lavena Bullion, DO;  Location[facility] ENDOSCOPY;  Service: Gastroenterology;  Laterality: Left;   LEFT AND RIGHT HEART CATHETERIZATION WITH CORONARY ANGIOGRAM N/A 11/06/2014   Procedure: LEFT AND RIGHT HEART CATHETERIZATION WITH CORONARY ANGIOGRAM;  Surgeon: Jettie Booze, MD;  Location: [facility] C[facility] LAB;  Service: Cardiovascular;  Laterality: N/A;   RIGHT/LEFT HEART CATH  AND CORONARY ANGIOGRAPHY N/A 12/06/2018   Procedure: RIGHT/LEFT HEART CATH AND CORONARY ANGIOGRAPHY;  Surgeon: Larey Dresser, MD;  Location: [place] CV LAB;  Service: Cardiovascular;  Laterality: N/A;   SPLENECTOMY     SUBMUCOSAL TATTOO INJECTION  03/15/2019   Procedure: SUBMUCOSAL TATTOO INJECTION;  Surgeon: Irving Copas., MD;  Location: [facility] ENDOSCOPY;  Service: Gastroenterology;;    Social History   Socioeconomic History   Marital status: Married    Spouse name: Not on file   Number of children: Not on file   Years of education: Not on file   Highest education level: Not on file  Occupational History   Not on file  Social Needs   Financial resource strain: Not on file   Food insecurity    Worry: Not on file    Inability: Not on file   Transportation needs    Medical: Not on file    Non-medical: Not on file  Tobacco Use   Smoking status: Former Smoker    Packs/day: 1.00    Years: 34.00    Pack years: 34.00    Types: Cigarettes    Quit date: 11/30/2018    Years since quitting: 0.5   Smokeless tobacco: Never Used  Substance and Sexual Activity   Alcohol use: No    Alcohol/week: 0.0 standard drinks   Drug use: No   Sexual activity: Not on file  Lifestyle   Physical activity    Days per week: Not on file    Minutes per session: Not on file   Stress: Not on file  Relationships   Social connections    Talks on phone: Not on file    Gets together: Not on file    Attends religious service: Not on file    Active member of club or organization: Not on file    Attends meetings of clubs or organizations: Not on file    Relationship status: Not on file   Intimate partner violence    Fear of current or ex partner: Not on file    Emotionally abused: Not on file    Physically abused: Not on file    Forced sexual activity: Not on file  Other Topics Concern   Not on file  Social History Narrative   Not on file    No Known  Allergies  Family History  Problem Relation Age of Onset   Heart disease Mother    Hypertension Mother    Heart failure Mother    Heart disease Father    Hypertension Sister    Heart attack Neg Hx    Stroke Neg Hx    Colon cancer Neg Hx    Esophageal cancer Neg Hx     Prior to Admission medications   Medication Sig Start Date End Date Taking? Authorizing Provider  albuterol (VENTOLIN HFA) 108 (90 Base) MCG/ACT inhaler Inhale 2 puffs into the lungs every 6 (six) hours as needed for wheezing or shortness of breath.    [provider]  apixaban (ELIQUIS) 5 MG TABS tablet Take 1 tablet (5 mg  total) by mouth 2 (two) times daily. 03/31/19   Kayleen Memos, DO  bisoprolol (ZEBETA) 5 MG tablet Take 0.5 tablets (2.5 mg total) by mouth at bedtime. 05/18/19   Larey Dresser, MD  budesonide-formoterol [facility]) 160-4.5 MCG/ACT inhaler Inhale 2 puffs into the lungs 2 (two) times daily.    [provider]  ferrous sulfate 325 (65 FE) MG tablet Take 1 tablet (325 mg total) by mouth 2 (two) times daily with a meal. 03/31/19   Hall, Carole N, DO  fluticasone (FLONASE) 50 MCG/ACT nasal spray Place 2 sprays into both nostrils daily as needed for allergies.     [provider]  gabapentin (NEURONTIN) 600 MG tablet Take 1 tablet (600 mg total) by mouth 2 (two) times daily. 01/24/19   Angiulli, Lavon Paganini, PA-C  guaifenesin (HUMIBID E) 400 MG TABS tablet Take 1 tablet (400 mg total) by mouth every 6 (six) hours as needed. Patient not taking: Reported on 05/11/2019 08/02/18   Regalado, Jerald Kief A, MD  hydrocortisone (ANUSOL-HC) 2.5 % rectal cream Place 1 application rectally 2 (two) times daily as needed for hemorrhoids or itching.     [provider]  hydrocortisone 1 % lotion Apply 1 application topically See admin instructions. Apply small amount to back twice daily for itchy rash    [provider]  insulin aspart (NOVOLOG) 100 UNIT/ML injection Inject 4 Units  into the skin 3 (three) times daily with meals. 03/31/19   Kayleen Memos, DO  insulin aspart protamine - aspart (NOVOLOG MIX 70/30 FLEXPEN) (70-30) 100 UNIT/ML FlexPen Inject 0.1 mLs (10 Units total) into the skin 2 (two) times daily. 03/31/19   Kayleen Memos, DO  ipratropium-albuterol (DUONEB) 0.5-2.5 (3) MG/3ML SOLN Take 3 mLs by nebulization 2 (two) times daily as needed (shortness of breath/wheezing).     [provider]  isosorbide-hydrALAZINE (BIDIL) 20-37.5 MG tablet Take 0.5 tablets by mouth 3 (three) times daily. 05/18/19   Larey Dresser, MD  latanoprost (XALATAN) 0.005 % ophthalmic solution Place 1 drop into both eyes at bedtime.    [provider]  metolazone (ZAROXOLYN) 2.5 MG tablet Take 1 tablet (2.5 mg total) by mouth 4 (four) times a week. Take extra Potassium 44mq when taking this medication Patient taking differently: Take 2.5 mg by mouth 4 (four) times a week. Take extra Potassium 250m when taking this medication 05/19/19   McLarey DresserMD  OXYGEN Inhale 3 L into the lungs continuous.     [provider]  pantoprazole (PROTONIX) 40 MG tablet Take 1 tablet (40 mg total) by mouth 2 (two) times daily. 05/05/19   Clegg, Amy D, NP  polyethylene glycol (MIRALAX / GLYCOLAX) packet Take 17 g by mouth daily. Patient taking differently: Take 17 g by mouth daily as needed (constipation).  01/24/19   Angiulli, DaLavon PaganiniPA-C  potassium chloride SA (K-DUR) 20 MEQ tablet Take 2 tablets (40 mEq total) by mouth 2 (two) times daily. With additional 2046mwhen you take Metolazone. 06/02/19   Clegg, Amy D, NP  predniSONE (DELTASONE) 10 MG tablet Take 6 tablets (60 mg total) by mouth daily with breakfast. Take 6 tabs tomorrow and then decrease by 1 tab daily until finished Patient not taking: Reported on 05/11/2019 04/15/19   McLLarey DresserD  rosuvastatin (CRESTOR) 20 MG tablet Take 1 tablet (20 mg total) by mouth daily at 6 PM. Patient not taking: Reported on 05/26/2019  05/18/19   McLLarey DresserD  sodium chloride (OCEAN) 0.65 % SOLN nasal spray Place 2 sprays into both nostrils as needed for congestion.     [provider]  tamsulosin (FLOMAX) 0.4 MG CAPS capsule Take 1 capsule (0.4 mg total) by mouth daily after supper. 04/20/19   Clegg, Amy D, NP  torsemide (DEMADEX) 20 MG tablet Take 4 tablets (80 mg total) by mouth 2 (two) times daily. 06/02/19   Darrick Grinder D, NP    Physical Exam: Vitals:   06/08/19 1045 06/08/19 1100 06/08/19 1115 06/08/19 1303  BP: 118/61 122/69 (!) 133/53 (!) 121/58  Pulse: 71 (!) 50  62  Resp: (!) 23 (!) _0 Temp:      TempSrc:    Oral  SpO2: 96% 96%  96%  Weight:      Height:    6' (1.829 m)      General:  Appears calm and comfortable but is very somnolent  Eyes:   EOMI, normal lids, iris  ENT:  grossly normal hearing, lips & tongue, mmm  Neck:  no LAD, masses or thyromegaly; no JVD  Cardiovascular:  RRR, no m/r/g. No LE edema.   Respiratory:   CTA bilaterally with scattered crackles.  Normal respiratory effort.  Abdomen:  soft, NT, ND, NABS  Skin:  no rash or induration seen on limited exam  Musculoskeletal:  grossly normal tone BUE/BLE, good ROM, no bony abnormality  Lower extremity:  No LE edema.  Limited foot exam with no ulcerations but significant plaque and lichenification.   Psychiatric:  somnolent mood and affect, speech limited  Neurologic:  Unable to perform    Radiological Exams on Admission: Dg Chest Portable 1 View  Result Date: 06/08/2019 CLINICAL DATA:  Shortness of breath EXAM: PORTABLE CHEST 1 VIEW COMPARISON:  04/14/2019 FINDINGS: Cardiomegaly and vascular pedicle widening. Diffuse interstitial coarsening with limited retrocardiac aeration which is stable from prior and primarily from cardiomegaly with atelectasis by chest CT. No pneumothorax. IMPRESSION: CHF pattern. Electronically Signed   By: Monte Fantasia M.D.   On: 06/08/2019 05:17    EKG: Independently reviewed.   NSR with rate 71; prolonged QT 600; nonspecific ST changes with no evidence of acute ischemia   Labs on Admission: I have personally reviewed the available labs and imaging studies at the time of the admission.  Pertinent labs:   CO2 34 Glucose 224 BUN 56/Creatinine 2.25/GFR 33; 53/1.59/50 on 6/11 - baseline 1.3-1.5 BNP 2301.2 HS troponin 58, 59 Lactate 1.6, 1.7 WBC 6.3 Hgb 10.1 - stable COVID negative today and also 6/6, 5/3 UA WNL  Assessment/Plan Principal Problem:   Acute on chronic systolic (congestive) heart failure (HCC) Active Problems:   Essential hypertension   Hyperlipidemia    Prolonged QT interval   COPD (chronic obstructive pulmonary disease) (HCC)   OSA (obstructive sleep apnea)   AKI (acute kidney injury) ([place])   Diabetes mellitus type 2 in obese (HCC)   Acute on chronic CHF exacerbation -Patient with known systolic CHF presenting with worsening SOB  -CXR consistent with pulmonary edema -Normal WBC count -Elevated BNP -Echo in 3/20 with EF 45-50% -With elevated BNP and abnl CXR, recurrent CHF seems probable as diagnosis -Will place in observation status with telemetry -Will request cardiology consult tomorrow -continue Bidil -Hold zaroxolyn -Was given Lasix 80 mg x 1 in ER and will repeat with 40 mg IV BID -Continue [place] O2 for now -Repeat EKG in AM -Elevated HS troponin is flat and thought to be related to demand ischemia  AKI on stage 3 CKD -Likely associated with decreased renal perfusion in the setting of CHF exacerbation -Will follow with diuresis  Prolonged QT -Normal K+  -Attempt to avoid QT prolonging medications where possible -Monitor on telemetry -Repeat EKG in AM  OSA -Continue CPAP qhs  COPD -Continue Dulera -Hold duonebs -Use Albuterol sparingly -Does not appear to have COPD exacerbation at this time  Afib -Rate controlled at this time with bisoprolol -Continue Eliquis  HTN -Continue bisoprolol, Bidil  DM -Continue  70/40 -Will cover with moderate-scale SSI for now    Note: This patient has been tested and is negative for the novel coronavirus COVID-19.    DVT prophylaxis: Eliquis  Code Status:  Full - confirmed with patient Family Communication: None present Disposition Plan:  Home once clinically improved Consults called: Cardiology; CM/PT  Admission status: It is my clinical opinion that referral for OBSERVATION is reasonable and necessary in this patient based on the above information provided. The aforementioned taken together are felt to place the patient at high risk for further clinical deterioration. However it is anticipated that the patient may be medically stable for discharge from the hospital within 24 to 48 hours.     Karmen Bongo MD [facility]   How to contact the [facility] Attending or Consulting provider [place] or covering provider during after hours [place], for this patient?  1. Check the care team in [facility] and look for a) attending/consulting TRH provider listed and b) the [facility] team listed 2. Log into [email or web address] and use [place]'s universal password to access. If you do not have the password, please contact the hospital operator. 3. Locate the [facility] provider you are looking for under [facility] and page to a number that you can be directly reached. 4. If you still have difficulty reaching the provider, please page the [facility] (Director on Call) for the Hospitalists listed on amion for assistance.   06/08/2019, 6:22 PM

## 2019-06-08 NOTE — Telephone Encounter (Signed)
CSW received call from [place]- they had received CSW request for PCP to send new orders to [facility] for home PT and RN- they have sent in new orders  CSW will continue to follow and assist as needed  Jorge Ny, [facility] Desk#: [phone] Cell#: [phone]

## 2019-06-08 NOTE — ED Notes (Signed)
ED TO INPATIENT HANDOFF REPORT  ED Nurse Name and Phone #: Lorrin Goodell [phone]  S Name/Age/Gender Nicholas Caldwell 73 y.o. male Room/Bed: 037C/037C  Code Status   Code Status: Full Code  Home/SNF/Other Home Patient oriented to: self, place, time and situation Is this baseline? Yes   Triage Complete: Triage complete  Chief Complaint leg   Triage Note Pt presents to ED from home BIB [facility]. Pt c/o SOB x3d. Pt on chronic [place] at home. EMS reports upon arrival pt sat 90% on home O2.  Pt says he tried nebs at home w/ no relief. During triage pt also c/o no bowel movement for 5-6d and cannot urinate for 1.5d.   Allergies No Known Allergies  Level of Care/Admitting Diagnosis ED Disposition    ED Disposition Condition [facility] Area: [place] [[id number]]  Level of Care: Telemetry Cardiac [103]  I expect the patient will be discharged within 24 hours: No (not a candidate for 5C-Observation unit)  Covid Evaluation: Asymptomatic Screening Protocol (No Symptoms)  Diagnosis: Acute on chronic systolic (congestive) heart failure ([place]) [[id number]]  Admitting Physician: Karmen Bongo [[id number]]  Attending Physician: Karmen Bongo [[id number]]  PT Class (Do Not Modify): Observation [104]  PT Acc Code (Do Not Modify): Observation [10022]       B Medical/Surgery History Past Medical History:  Diagnosis Date  . Atrial flutter ([place])    a. recurrent AFlutter with RVR;  b. Amiodarone Rx started 4/16  . CAD (coronary artery disease)    a. LHC 1/16:  mLAD diffuse disease, pLCx mild disease, dLCx with disease but too small for PCI, RCA ok, EF 25-30%  . Chronic pain   . Chronic systolic CHF (congestive heart failure) ([place])   . COPD (chronic obstructive pulmonary disease) ([place])   . Diabetes mellitus without complication ([place])   . Hypercholesteremia   . Hypertension   . NICM (nonischemic cardiomyopathy) ([place])    a.dx 2016. b. 2D echo 06/2016 - Last echo 07/01/16: mod dilated  LV, mod LVH, EF 25-30%, mild-mod MR, sev LAE, mild-mod reduced RV systolic function, mild-mod TR, PASP 22mHG.  .Marland KitchenPAF (paroxysmal atrial fibrillation) (H[facility])    On amio - ot a candidate for flecainide due to cardiomyopathy, not a candidate for Tikosyn due to prolonged QT, and felt to be a poor candidate for ablation given left atrial size.  . Pulmonary hypertension ([place]   . Tobacco abuse    Past Surgical History:  Procedure Laterality Date  . BIOPSY  03/11/2019   Procedure: BIOPSY;  Surgeon: CLavena Bullion DO;  [place] [facility]  Service: Gastroenterology;;  . BIOPSY  03/15/2019   Procedure: BIOPSY;  Surgeon: MIrving Copas, MD;  [place] [facility]  Service: Gastroenterology;;  . CARDIOVERSION N/A 07/30/2018   Procedure: CARDIOVERSION;  Surgeon: MLarey Dresser MD;  [facility]  Service: Cardiovascular;  Laterality: N/A;  . COLONOSCOPY N/A 03/11/2019   Procedure: COLONOSCOPY;  Surgeon: CLavena Bullion DO;  [place] [facility]  Service: Gastroenterology;  Laterality: N/A;  . ENTEROSCOPY N/A 03/15/2019   Procedure: ENTEROSCOPY;  Surgeon: MRush LandmarkGTelford Nab, MD;  [place] [facility]  Service: Gastroenterology;  Laterality: N/A;  . ESOPHAGOGASTRODUODENOSCOPY Left 03/11/2019   Procedure: ESOPHAGOGASTRODUODENOSCOPY (EGD);  Surgeon: CLavena Bullion DO;  [facility]  Service: Gastroenterology;  Laterality: Left;  . LEFT AND RIGHT HEART CATHETERIZATION WITH CORONARY ANGIOGRAM N/A 11/06/2014   Procedure: LEFT AND RIGHT HEART CATHETERIZATION WITH CORONARY ANGIOGRAM;  Surgeon: JCharlann Lange  [name], MD;  Location: [facility] CATH LAB;  Service: Cardiovascular;  Laterality: N/A;  . RIGHT/LEFT HEART CATH AND CORONARY ANGIOGRAPHY N/A 12/06/2018   Procedure: RIGHT/LEFT HEART CATH AND CORONARY ANGIOGRAPHY;  Surgeon: Larey Dresser, MD;  Location: [place] CV LAB;  Service: Cardiovascular;  Laterality: N/A;  . SPLENECTOMY    . SUBMUCOSAL TATTOO INJECTION   03/15/2019   Procedure: SUBMUCOSAL TATTOO INJECTION;  Surgeon: Rush Landmark Telford Nab., MD;  Location: [place];  Service: Gastroenterology;;     A IV Location/Drains/Wounds Patient Lines/Drains/Airways Status   Active Line/Drains/Airways    Name:   Placement date:   Placement time:   Site:   Days:   Peripheral IV 06/08/19 Left;Posterior Hand   06/08/19    0442    Hand   less than 1   Pressure Injury 11/22/18 Stage I -  Intact skin with non-blanchable redness of a localized area usually over a bony prominence. healing pink   11/22/18    0230     198   Wound / Incision (Open or Dehisced) 12/18/16 Burn Chest Mid;Left;Anterior;Right;Posterior from Cardioversion   12/18/16    0750    Chest   902          Intake/Output Last 24 hours  Intake/Output Summary (Last 24 hours) at 06/08/2019 1222 Last data filed at 06/08/2019 1119 Gross per 24 hour  Intake -  Output 1600 ml  Net -1600 ml    Labs/Imaging Results for orders placed or performed during the hospital encounter of 06/08/19 (from the past 48 hour(s))  Brain natriuretic peptide     Status: Abnormal   Collection Time: 06/08/19  4:46 AM  Result Value Ref Range   B Natriuretic Peptide 2,301.2 (H) 0.0 - 100.0 pg/mL    Comment: Performed at [facility], [address] [place], [place] [address]  Lactic acid, plasma     Status: None   Collection Time: 06/08/19  4:46 AM  Result Value Ref Range   Lactic Acid, Venous 1.6 0.5 - 1.9 mmol/L    Comment: Performed at [facility] [address] [place], [place] [address]  CBC with Differential     Status: Abnormal   Collection Time: 06/08/19  4:56 AM  Result Value Ref Range   WBC 6.3 4.0 - 10.5 K/uL   RBC 3.92 (L) 4.22 - 5.81 MIL/uL   Hemoglobin 10.1 (L) 13.0 - 17.0 g/dL   HCT 35.2 (L) 39.0 - 52.0 %   MCV 89.8 80.0 - 100.0 fL   MCH 25.8 (L) 26.0 - 34.0 pg   MCHC 28.7 (L) 30.0 - 36.0 g/dL   RDW 18.6 (H) 11.5 - 15.5 %   Platelets 373 150 - 400 K/uL   nRBC 0.0 0.0 - 0.2 %    Neutrophils Relative % 68 %   Neutro Abs 4.3 1.7 - 7.7 K/uL   Lymphocytes Relative 15 %   Lymphs Abs 0.9 0.7 - 4.0 K/uL   Monocytes Relative 14 %   Monocytes Absolute 0.9 0.1 - 1.0 K/uL   Eosinophils Relative 2 %   Eosinophils Absolute 0.2 0.0 - 0.5 K/uL   Basophils Relative 1 %   Basophils Absolute 0.1 0.0 - 0.1 K/uL   Immature Granulocytes 0 %   Abs Immature Granulocytes 0.02 0.00 - 0.07 K/uL    Comment: Performed at [facility], [address], [place], [place] [address]  Basic metabolic panel     Status: Abnormal   Collection Time:  06/08/19  4:56 AM  Result Value Ref Range   Sodium 137 135 - 145 mmol/L   Potassium 4.0 3.5 - 5.1 mmol/L   Chloride 90 (L) 98 - 111 mmol/L   CO2 34 (H) 22 - 32 mmol/L   Glucose, Bld 224 (H) 70 - 99 mg/dL   BUN 56 (H) 8 - 23 mg/dL   Creatinine, Ser 2.25 (H) 0.61 - 1.24 mg/dL   Calcium 8.8 (L) 8.9 - 10.3 mg/dL   GFR calc non Af Amer 28 (L) >60 mL/min   GFR calc Af Amer 33 (L) >60 mL/min   Anion gap 13 5 - 15    Comment: Performed at [facility] [address], [place], [place] [address]  Troponin I (High Sensitivity)     Status: Abnormal   Collection Time: 06/08/19  4:56 AM  Result Value Ref Range   Troponin I (High Sensitivity) 58 (H) <18 ng/L    Comment: (NOTE) Elevated high sensitivity troponin I (hsTnI) values and significant  changes across serial measurements may suggest ACS but many other  chronic and acute conditions are known to elevate hsTnI results.  Refer to the "Links" section for chest pain algorithms and additional  guidance. Performed at [facility], [place] [address], [place], [place] [address]   Urinalysis, Routine w reflex microscopic     Status: None   Collection Time: 06/08/19  4:56 AM  Result Value Ref Range   Color, Urine YELLOW YELLOW   APPearance CLEAR CLEAR   Specific Gravity, Urine 1.009 1.005 - 1.030   pH 6.0 5.0 - 8.0   Glucose, UA NEGATIVE NEGATIVE mg/dL   Hgb urine dipstick NEGATIVE  NEGATIVE   Bilirubin Urine NEGATIVE NEGATIVE   Ketones, ur NEGATIVE NEGATIVE mg/dL   Protein, ur NEGATIVE NEGATIVE mg/dL   Nitrite NEGATIVE NEGATIVE   Leukocytes,Ua NEGATIVE NEGATIVE    Comment: Performed at [facility] [address], [place], [place] [address]  Lactic acid, plasma     Status: None   Collection Time: 06/08/19  7:49 AM  Result Value Ref Range   Lactic Acid, Venous 1.7 0.5 - 1.9 mmol/L    Comment: Performed at [facility] [address], [place], [place] [address]  Troponin I (High Sensitivity)     Status: Abnormal   Collection Time: 06/08/19  7:49 AM  Result Value Ref Range   Troponin I (High Sensitivity) 59 (H) <18 ng/L    Comment: (NOTE) Elevated high sensitivity troponin I (hsTnI) values and significant  changes across serial measurements may suggest ACS but many other  chronic and acute conditions are known to elevate hsTnI results.  Refer to the "Links" section for chest pain algorithms and additional  guidance. Performed at [facility], [place] [address], [place], [place] [address]    Dg Chest Portable 1 View  Result Date: 06/08/2019 CLINICAL DATA:  Shortness of breath EXAM: PORTABLE CHEST 1 VIEW COMPARISON:  04/14/2019 FINDINGS: Cardiomegaly and vascular pedicle widening. Diffuse interstitial coarsening with limited retrocardiac aeration which is stable from prior and primarily from cardiomegaly with atelectasis by chest CT. No pneumothorax. IMPRESSION: CHF pattern. Electronically Signed   By: Monte Fantasia M.D.   On: 06/08/2019 05:17    Pending Labs Unresulted Labs (From admission, onward)    Start     Ordered   06/09/19 7341  Basic metabolic panel  Daily,   R     06/08/19 0950   06/09/19 0000  CBC WITH DIFFERENTIAL  Tomorrow morning,   R     06/08/19 0950   06/08/19 0456  SARS CORONAVIRUS 2 Nasal Swab Aptima Multi Swab  (Asymptomatic/Tier 2 Patients Labs)  Once,   STAT    Question Answer Comment  Is this test for diagnosis or  screening Screening   Symptomatic for COVID-19 as defined by CDC No   Hospitalized for COVID-19 No   Admitted to ICU for COVID-19 No   Previously tested for COVID-19 Yes   Resident in a congregate (group) care setting No   Employed in healthcare setting No      06/08/19 0456          Vitals/Pain Today's Vitals   06/08/19 1045 06/08/19 1100 06/08/19 1113 06/08/19 1115  BP: 118/61 122/69  (!) 133/53  Pulse: 71 (!) 50    Resp: (!) 23 (!) 21  19  Temp:      TempSrc:      SpO2: 96% 96%    Weight:      Height:      PainSc:   9      Isolation Precautions No active isolations  Medications Medications  bisoprolol (ZEBETA) tablet 2.5 mg (has no administration in time range)  isosorbide-hydrALAZINE (BIDIL) 20-37.5 MG per tablet 0.5 tablet (has no administration in time range)  rosuvastatin (CRESTOR) tablet 20 mg (has no administration in time range)  pantoprazole (PROTONIX) EC tablet 40 mg (has no administration in time range)  polyethylene glycol (MIRALAX / GLYCOLAX) packet 17 g (has no administration in time range)  tamsulosin (FLOMAX) capsule 0.4 mg (has no administration in time range)  apixaban (ELIQUIS) tablet 5 mg (has no administration in time range)  gabapentin (NEURONTIN) capsule 300 mg (has no administration in time range)  albuterol (PROVENTIL) (2.5 MG/3ML) 0.083% nebulizer solution 3 mL (has no administration in time range)  mometasone-formoterol (DULERA) 200-5 MCG/ACT inhaler 2 puff (has no administration in time range)  ipratropium-albuterol (DUONEB) 0.5-2.5 (3) MG/3ML nebulizer solution 3 mL (has no administration in time range)  hydrocortisone (ANUSOL-HC) 2.5 % rectal cream 1 application (has no administration in time range)  sodium chloride flush (NS) 0.9 % injection 3 mL (has no administration in time range)  sodium chloride flush (NS) 0.9 % injection 3 mL (has no administration in time range)  0.9 %  sodium chloride infusion (has no administration in time  range)  aspirin EC tablet 81 mg (81 mg Oral Given 06/08/19 1113)  acetaminophen (TYLENOL) tablet 650 mg (has no administration in time range)  ondansetron (ZOFRAN) injection 4 mg (has no administration in time range)  insulin aspart (novoLOG) injection 0-15 Units (has no administration in time range)  furosemide (LASIX) injection 40 mg (has no administration in time range)  insulin aspart (novoLOG) injection 0-5 Units (has no administration in time range)  oxyCODONE-acetaminophen (PERCOCET/ROXICET) 5-325 MG per tablet 1 tablet (has no administration in time range)    And  oxyCODONE (Oxy IR/ROXICODONE) immediate release tablet 5 mg (5 mg Oral Given 06/08/19 1113)  insulin aspart protamine- aspart (NOVOLOG MIX 70/30) injection 10 Units (has no administration in time range)  furosemide (LASIX) injection 80 mg (80 mg Intravenous Given 06/08/19 5035)    Mobility walks High fall risk   Focused Assessments Cardiac Assessment Handoff:  Cardiac Rhythm: Normal sinus rhythm Lab Results  Component Value Date   CKTOTAL 35 (L) 10/30/2016   TROPONINI 0.03 (HH) 04/10/2019   Lab Results  Component Value Date   DDIMER 0.42 10/31/2014  Does the Patient currently have chest pain? No     R Recommendations: See Admitting Provider Note  Report given to:   Additional Notes:

## 2019-06-08 NOTE — ED Provider Notes (Signed)
[facility] EMERGENCY DEPARTMENT Provider Note   CSN: [id number] Arrival date & time: 06/08/19  0430    History   Chief Complaint Chief Complaint  Patient presents with   Shortness of Breath    HPI Nicholas Caldwell is a 73 y.o. male.     HPI  This is a 73 year old male with a history of atrial flutter, coronary artery disease, chronic systolic heart failure, COPD, diabetes who presents with shortness of breath.  Patient reports 3-day history of worsening shortness of breath.  He reports orthopnea.  He has had occasional chest pain but no chest pain right now.  He at baseline is on 3 L of nasal cannula while at home.  He denies any recent changes in medication and reports compliance with medication.  He states he has had difficulty urinating and has not urinated in over 24 hours.  He also reports 4 to 5-day history of no bowel movement.  Denies any abdominal pain.  Denies fever or cough.  He has been using his inhaler at home with minimal relief.  He has noted lower extremity swelling and weight gain.  Past Medical History:  Diagnosis Date   Atrial flutter ([place])    a. recurrent AFlutter with RVR;  b. Amiodarone Rx started 4/16   CAD (coronary artery disease)    a. LHC 1/16:  mLAD diffuse disease, pLCx mild disease, dLCx with disease but too small for PCI, RCA ok, EF 25-30%   Chronic pain    Chronic systolic CHF (congestive heart failure) (HCC)    COPD (chronic obstructive pulmonary disease) ([place])    Diabetes mellitus without complication ([place])    Hypercholesteremia    Hypertension    NICM (nonischemic cardiomyopathy) ([place])    a.dx 2016. b. 2D echo 06/2016 - Last echo 07/01/16: mod dilated LV, mod LVH, EF 25-30%, mild-mod MR, sev LAE, mild-mod reduced RV systolic function, mild-mod TR, PASP 72mHG.   PAF (paroxysmal atrial fibrillation) (HCC)    On amio - ot a candidate for flecainide due to cardiomyopathy, not a candidate for Tikosyn due to prolonged QT,  and felt to be a poor candidate for ablation given left atrial size.   Pulmonary hypertension (HCC)    Tobacco abuse     Patient Active Problem List   Diagnosis Date Noted   Lobar pneumonia ([facility] 04/14/2019   Hyperkalemia 04/10/2019   Cerebral embolism with cerebral infarction 03/21/2019   Gastritis and gastroduodenitis    NSVT (nonsustained ventricular tachycardia) ([place] 03/08/2019   Tobacco abuse counseling 03/08/2019   Iron deficiency anemia    Acute on chronic systolic CHF (congestive heart failure) ([facility] 03/06/2019   Diabetes mellitus type 2 in obese ([facility]    Primary osteoarthritis of right hip    Hypomagnesemia    Steroid-induced hyperglycemia    Supplemental oxygen dependent    Leukocytosis    Hypokalemia    Acute on chronic anemia    Diabetic peripheral neuropathy (HCC)    Acute on chronic systolic (congestive) heart failure (HCC)    Acute on chronic respiratory failure ([facility] 01/14/2019   Hypoxia    Altered mental status    Heart failure ([facility] 12/30/2018   AKI (acute kidney injury) ([facility] [facility]    Acute respiratory failure ([facility] 11/22/2018   Acute on chronic respiratory failure with hypoxia and hypercapnia ([facility] 041/28/7867  Metabolic encephalopathy 067/20/9470  Acute respiratory failure with hypoxia and hypercapnia ([facility] 07/26/2018   Acute exacerbation of CHF (congestive heart failure) ([facility] [place] 07/26/2018  CHF exacerbation ([place]) 05/01/2018   CHF (congestive heart failure) ([place]) 04/30/2018   Chronic respiratory failure with hypoxia ([place]) 12/28/2017   Acute encephalopathy    Atrial fibrillation with RVR (HCC)    SVT (supraventricular tachycardia) (HCC)    COPD GOLD 0    Medically noncompliant    Panlobular emphysema (HCC)    OSA (obstructive sleep apnea)    Pulmonary hypertension ([place])    Disorientation    Pressure injury of skin 09/07/2016   Acute on chronic combined systolic and diastolic CHF (congestive heart failure) ([place])  09/05/2016   Skin lesion-left heal 76/16/0737   Chronic systolic CHF (congestive heart failure) ([place]) 07/16/2016   COPD (chronic obstructive pulmonary disease) ([place]) 07/16/2016   Uncontrolled type 2 diabetes mellitus with complication (HCC)    Diabetic polyneuropathy associated with diabetes mellitus due to underlying condition (HCC)    Normocytic anemia 06/29/2016   CAD - Non-obstructive by LHC1/16 01/21/2016   Nonischemic cardiomyopathy ([place]) 10/09/2015   PAF (paroxysmal atrial fibrillation) (HCC)    Chronic pain 02/19/2015   Morbid obesity ([place]) 02/13/2015   Dyspnea    Elevated troponin I level 11/01/2014   COPD exacerbation ([place]) 11/01/2014   Essential hypertension 10/31/2014   Type 2 diabetes mellitus with neuropathy 10/31/2014   Hyperlipidemia  10/31/2014   Cigarette smoker 10/31/2014    Past Surgical History:  Procedure Laterality Date   BIOPSY  03/11/2019   Procedure: BIOPSY;  Surgeon: Lavena Bullion, DO;  Location: [facility];  Service: Gastroenterology;;   BIOPSY  03/15/2019   Procedure: BIOPSY;  Surgeon: Irving Copas., MD;  Location: [facility] ENDOSCOPY;  Service: Gastroenterology;;   CARDIOVERSION N/A 07/30/2018   Procedure: CARDIOVERSION;  Surgeon: Larey Dresser, MD;  Location: [facility] ENDOSCOPY;  Service: Cardiovascular;  Laterality: N/A;   COLONOSCOPY N/A 03/11/2019   Procedure: COLONOSCOPY;  Surgeon: Lavena Bullion, DO;  Location: [facility] ENDOSCOPY;  Service: Gastroenterology;  Laterality: N/A;   ENTEROSCOPY N/A 03/15/2019   Procedure: ENTEROSCOPY;  Surgeon: Rush Landmark Telford Nab., MD;  Location: [place];  Service: Gastroenterology;  Laterality: N/A;   ESOPHAGOGASTRODUODENOSCOPY Left 03/11/2019   Procedure: ESOPHAGOGASTRODUODENOSCOPY (EGD);  Surgeon: Lavena Bullion, DO;  Location: [facility] ENDOSCOPY;  Service: Gastroenterology;  Laterality: Left;   LEFT AND RIGHT HEART CATHETERIZATION WITH CORONARY ANGIOGRAM N/A 11/06/2014   Procedure: LEFT  AND RIGHT HEART CATHETERIZATION WITH CORONARY ANGIOGRAM;  Surgeon: Jettie Booze, MD;  Location: [facility] CATH LAB;  Service: Cardiovascular;  Laterality: N/A;   RIGHT/LEFT HEART CATH AND CORONARY ANGIOGRAPHY N/A 12/06/2018   Procedure: RIGHT/LEFT HEART CATH AND CORONARY ANGIOGRAPHY;  Surgeon: Larey Dresser, MD;  Location: [place] CV LAB;  Service: Cardiovascular;  Laterality: N/A;   SPLENECTOMY     SUBMUCOSAL TATTOO INJECTION  03/15/2019   Procedure: SUBMUCOSAL TATTOO INJECTION;  Surgeon: Irving Copas., MD;  Location: [place];  Service: Gastroenterology;;        Home Medications    Prior to Admission medications   Medication Sig Start Date End Date Taking? Authorizing Provider  albuterol (VENTOLIN HFA) 108 (90 Base) MCG/ACT inhaler Inhale 2 puffs into the lungs every 6 (six) hours as needed for wheezing or shortness of breath.    [provider]  apixaban (ELIQUIS) 5 MG TABS tablet Take 1 tablet (5 mg total) by mouth 2 (two) times daily. 03/31/19   Kayleen Memos, DO  bisoprolol (ZEBETA) 5 MG tablet Take 0.5 tablets (2.5 mg total) by mouth at bedtime. 05/18/19   Larey Dresser, MD  budesonide-formoterol (SYMBICORT) 160-4.5 MCG/ACT inhaler Inhale 2 puffs into the lungs 2 (two) times daily.    [provider]  ferrous sulfate 325 (65 FE) MG tablet Take 1 tablet (325 mg total) by mouth 2 (two) times daily with a meal. 03/31/19   Hall, Carole N, DO  fluticasone (FLONASE) 50 MCG/ACT nasal spray Place 2 sprays into both nostrils daily as needed for allergies.     [provider]  gabapentin (NEURONTIN) 600 MG tablet Take 1 tablet (600 mg total) by mouth 2 (two) times daily. 01/24/19   Angiulli, Lavon Paganini, PA-C  guaifenesin (HUMIBID E) 400 MG TABS tablet Take 1 tablet (400 mg total) by mouth every 6 (six) hours as needed. Patient not taking: Reported on 05/11/2019 08/02/18   Regalado, Jerald Kief A, MD  hydrocortisone (ANUSOL-HC) 2.5 % rectal cream Place 1  application rectally 2 (two) times daily as needed for hemorrhoids or itching.     [provider]  hydrocortisone 1 % lotion Apply 1 application topically See admin instructions. Apply small amount to back twice daily for itchy rash    [provider]  insulin aspart (NOVOLOG) 100 UNIT/ML injection Inject 4 Units into the skin 3 (three) times daily with meals. 03/31/19   Kayleen Memos, DO  insulin aspart protamine - aspart (NOVOLOG MIX 70/30 FLEXPEN) (70-30) 100 UNIT/ML FlexPen Inject 0.1 mLs (10 Units total) into the skin 2 (two) times daily. 03/31/19   Kayleen Memos, DO  ipratropium-albuterol (DUONEB) 0.5-2.5 (3) MG/3ML SOLN Take 3 mLs by nebulization 2 (two) times daily as needed (shortness of breath/wheezing).     [provider]  isosorbide-hydrALAZINE (BIDIL) 20-37.5 MG tablet Take 0.5 tablets by mouth 3 (three) times daily. 05/18/19   Larey Dresser, MD  latanoprost (XALATAN) 0.005 % ophthalmic solution Place 1 drop into both eyes at bedtime.    [provider]  metolazone (ZAROXOLYN) 2.5 MG tablet Take 1 tablet (2.5 mg total) by mouth 4 (four) times a week. Take extra Potassium 6mq when taking this medication Patient taking differently: Take 2.5 mg by mouth 4 (four) times a week. Take extra Potassium 261m when taking this medication 05/19/19   McLarey DresserMD  OXYGEN Inhale 3 L into the lungs continuous.     [provider]  pantoprazole (PROTONIX) 40 MG tablet Take 1 tablet (40 mg total) by mouth 2 (two) times daily. 05/05/19   Clegg, Amy D, NP  polyethylene glycol (MIRALAX / GLYCOLAX) packet Take 17 g by mouth daily. Patient taking differently: Take 17 g by mouth daily as needed (constipation).  01/24/19   Angiulli, DaLavon PaganiniPA-C  potassium chloride SA (K-DUR) 20 MEQ tablet Take 2 tablets (40 mEq total) by mouth 2 (two) times daily. With additional 203mwhen you take Metolazone. 06/02/19   Clegg, Amy D, NP  predniSONE (DELTASONE) 10 MG  tablet Take 6 tablets (60 mg total) by mouth daily with breakfast. Take 6 tabs tomorrow and then decrease by 1 tab daily until finished Patient not taking: Reported on 05/11/2019 04/15/19   McLLarey DresserD  rosuvastatin (CRESTOR) 20 MG tablet Take 1 tablet (20 mg total) by mouth daily at 6 PM. Patient not taking: Reported on 05/26/2019 05/18/19   McLLarey DresserD  sodium chloride (OCEAN) 0.65 % SOLN nasal spray Place 2 sprays into both nostrils as needed for congestion.     [provider]  tamsulosin (FLOMAX) 0.4 MG CAPS capsule Take 1 capsule (0.4 mg  total) by mouth daily after supper. 04/20/19   Clegg, Amy D, NP  torsemide (DEMADEX) 20 MG tablet Take 4 tablets (80 mg total) by mouth 2 (two) times daily. 06/02/19   Clegg, Amy D, NP    Family History Family History  Problem Relation Age of Onset   Heart disease Mother    Hypertension Mother    Heart failure Mother    Heart disease Father    Hypertension Sister    Heart attack Neg Hx    Stroke Neg Hx    Colon cancer Neg Hx    Esophageal cancer Neg Hx     Social History Social History   Tobacco Use   Smoking status: Former Smoker    Packs/day: 1.00    Years: 34.00    Pack years: 34.00    Types: Cigarettes    Quit date: 11/30/2018    Years since quitting: 0.5   Smokeless tobacco: Never Used  Substance Use Topics   Alcohol use: No    Alcohol/week: 0.0 standard drinks   Drug use: No     Allergies   Patient has no known allergies.   Review of Systems Review of Systems  Constitutional: Negative for fever.  Respiratory: Positive for shortness of breath. Negative for cough.   Cardiovascular: Positive for chest pain and leg swelling. Negative for palpitations.  Gastrointestinal: Positive for constipation. Negative for abdominal pain, nausea and vomiting.  Genitourinary: Positive for difficulty urinating. Negative for dysuria.  All other systems reviewed and are negative.    Physical Exam Updated  Vital Signs BP 116/63    Pulse 72    Temp 99.8 F (37.7 C) (Rectal)    Resp 19    Ht 1.829 m (6')    Wt 114.8 kg    SpO2 99%    BMI 34.31 kg/m   Physical Exam Vitals signs and nursing note reviewed.  Constitutional:      Comments: Chronically ill-appearing  HENT:     Head: Normocephalic and atraumatic.  Neck:     Musculoskeletal: Neck supple.     Vascular: JVD present.  Cardiovascular:     Rate and Rhythm: Normal rate and regular rhythm.     Heart sounds: Normal heart sounds. No murmur.  Pulmonary:     Effort: Pulmonary effort is normal. Tachypnea present. No respiratory distress.     Breath sounds: Wheezing present.     Comments: Wheezing in all lung fields, mild tachypnea noted, nasal cannula in place Chest:     Chest wall: No tenderness.  Abdominal:     General: Bowel sounds are normal.     Palpations: Abdomen is soft.     Tenderness: There is no abdominal tenderness. There is no rebound.  Musculoskeletal:     Comments: 2+ bilateral lower extremity edema Discoloration of the right third toe  Lymphadenopathy:     Cervical: No cervical adenopathy.  Skin:    General: Skin is warm and dry.  Neurological:     Mental Status: He is alert and oriented to person, place, and time.  Psychiatric:        Mood and Affect: Mood normal.      ED Treatments / Results  Labs (all labs ordered are listed, but only abnormal results are displayed) Labs Reviewed  CBC WITH DIFFERENTIAL/PLATELET - Abnormal; Notable for the following components:      Result Value   RBC 3.92 (*)    Hemoglobin 10.1 (*)    HCT 35.2 (*)  MCH 25.8 (*)    MCHC 28.7 (*)    RDW 18.6 (*)    All other components within normal limits  BASIC METABOLIC PANEL - Abnormal; Notable for the following components:   Chloride 90 (*)    CO2 34 (*)    Glucose, Bld 224 (*)    BUN 56 (*)    Creatinine, Ser 2.25 (*)    Calcium 8.8 (*)    GFR calc non Af Amer 28 (*)    GFR calc Af Amer 33 (*)    All other components  within normal limits  BRAIN NATRIURETIC PEPTIDE - Abnormal; Notable for the following components:   B Natriuretic Peptide 2,301.2 (*)    All other components within normal limits  TROPONIN I (HIGH SENSITIVITY) - Abnormal; Notable for the following components:   Troponin I (High Sensitivity) 58 (*)    All other components within normal limits  SARS CORONAVIRUS 2  URINALYSIS, ROUTINE W REFLEX MICROSCOPIC  LACTIC ACID, PLASMA  LACTIC ACID, PLASMA    EKG EKG Interpretation  Date/Time:  Wednesday June 08 2019 05:03:17 EDT Ventricular Rate:  71 PR Interval:    QRS Duration: 105 QT Interval:  552 QTC Calculation: 600 R Axis:   111 Text Interpretation:  Sinus rhythm Atrial premature complexes Probable left atrial enlargement Right axis deviation Abnormal R-wave progression, late transition Borderline repolarization abnormality Prolonged QT interval No significant change since last tracing Confirmed by Thayer Jew [id number]) on 06/08/2019 6:21:21 AM   Radiology Dg Chest Portable 1 View  Result Date: 06/08/2019 CLINICAL DATA:  Shortness of breath EXAM: PORTABLE CHEST 1 VIEW COMPARISON:  04/14/2019 FINDINGS: Cardiomegaly and vascular pedicle widening. Diffuse interstitial coarsening with limited retrocardiac aeration which is stable from prior and primarily from cardiomegaly with atelectasis by chest CT. No pneumothorax. IMPRESSION: CHF pattern. Electronically Signed   By: Monte Fantasia M.D.   On: 06/08/2019 05:17    Procedures Procedures (including critical care time)  CRITICAL CARE Performed by: Merryl Hacker   Total critical care time: 45 minutes  Critical care time was exclusive of separately billable procedures and treating other patients.  Critical care was necessary to treat or prevent imminent or life-threatening deterioration.  Critical care was time spent personally by me on the following activities: development of treatment plan with patient and/or surrogate as  well as nursing, discussions with consultants, evaluation of patient's response to treatment, examination of patient, obtaining history from patient or surrogate, ordering and performing treatments and interventions, ordering and review of laboratory studies, ordering and review of radiographic studies, pulse oximetry and re-evaluation of patient's condition.   Medications Ordered in ED Medications  furosemide (LASIX) injection 80 mg (has no administration in time range)     Initial Impression / Assessment and Plan / ED Course  I have reviewed the triage vital signs and the nursing notes.  Pertinent labs & imaging results that were available during my care of the patient were reviewed by me and considered in my medical decision making (see chart for details).        Presents with shortness of breath.  Reports weight gain and leg swelling.  He is overall chronically ill-appearing but nontoxic.  He does appear somewhat short of breath and has wheezing in all lung fields.  Vital signs are reassuring.  Satting 99% on his home O2 requirement.  He does appear volume overloaded.  Chest x-ray obtained as well as basic lab work.  Chest x-ray shows evidence of pulmonary  edema.  Lab work notable for creatinine of 2.25 which is acutely elevated.  EKG is reassuring without arrhythmia or ischemia.  Troponin is elevated at 58; however, this is in the setting of acute renal failure and acute CHF exacerbation.  Patient was given 80 mg of Lasix.    Final Clinical Impressions(s) / ED Diagnoses   Final diagnoses:  Acute on chronic systolic congestive heart failure ([place])  AKI (acute kidney injury) [facility])    ED Discharge Orders    None       Dina Rich, Barbette Hair, MD 06/08/19 [phone]

## 2019-06-08 NOTE — ED Notes (Signed)
Report given to [facility], RN on 3E

## 2019-06-08 NOTE — Progress Notes (Signed)
PT Cancellation Note  Patient Details Name: Nicholas Caldwell MRN: [id number] DOB: 03/26/1946   Cancelled Treatment:    Reason Eval/Treat Not Completed: Patient declined, no reason specified. Pt requested PT to come tomorrow stating, "I just got here."  "Come back tomorrow."  Will check back as schedule permits.   Galen Manila 06/08/2019, 1:36 PM

## 2019-06-09 ENCOUNTER — Other Ambulatory Visit (HOSPITAL_COMMUNITY): Payer: Self-pay | Admitting: Cardiology

## 2019-06-09 DIAGNOSIS — I272 Pulmonary hypertension, unspecified: Secondary | ICD-10-CM | POA: Diagnosis present

## 2019-06-09 DIAGNOSIS — E785 Hyperlipidemia, unspecified: Secondary | ICD-10-CM

## 2019-06-09 DIAGNOSIS — I248 Other forms of acute ischemic heart disease: Secondary | ICD-10-CM | POA: Diagnosis present

## 2019-06-09 DIAGNOSIS — N179 Acute kidney failure, unspecified: Secondary | ICD-10-CM

## 2019-06-09 DIAGNOSIS — E7800 Pure hypercholesterolemia, unspecified: Secondary | ICD-10-CM | POA: Diagnosis present

## 2019-06-09 DIAGNOSIS — G8929 Other chronic pain: Secondary | ICD-10-CM | POA: Diagnosis present

## 2019-06-09 DIAGNOSIS — I10 Essential (primary) hypertension: Secondary | ICD-10-CM

## 2019-06-09 DIAGNOSIS — Z87891 Personal history of nicotine dependence: Secondary | ICD-10-CM | POA: Diagnosis not present

## 2019-06-09 DIAGNOSIS — Z8249 Family history of ischemic heart disease and other diseases of the circulatory system: Secondary | ICD-10-CM | POA: Diagnosis not present

## 2019-06-09 DIAGNOSIS — E1169 Type 2 diabetes mellitus with other specified complication: Secondary | ICD-10-CM | POA: Diagnosis not present

## 2019-06-09 DIAGNOSIS — E662 Morbid (severe) obesity with alveolar hypoventilation: Secondary | ICD-10-CM | POA: Diagnosis present

## 2019-06-09 DIAGNOSIS — I480 Paroxysmal atrial fibrillation: Secondary | ICD-10-CM | POA: Diagnosis present

## 2019-06-09 DIAGNOSIS — G4733 Obstructive sleep apnea (adult) (pediatric): Secondary | ICD-10-CM

## 2019-06-09 DIAGNOSIS — J42 Unspecified chronic bronchitis: Secondary | ICD-10-CM | POA: Diagnosis not present

## 2019-06-09 DIAGNOSIS — E669 Obesity, unspecified: Secondary | ICD-10-CM

## 2019-06-09 DIAGNOSIS — I5023 Acute on chronic systolic (congestive) heart failure: Secondary | ICD-10-CM | POA: Diagnosis present

## 2019-06-09 DIAGNOSIS — R5381 Other malaise: Secondary | ICD-10-CM | POA: Diagnosis present

## 2019-06-09 DIAGNOSIS — I509 Heart failure, unspecified: Secondary | ICD-10-CM

## 2019-06-09 DIAGNOSIS — I428 Other cardiomyopathies: Secondary | ICD-10-CM | POA: Diagnosis present

## 2019-06-09 DIAGNOSIS — E1122 Type 2 diabetes mellitus with diabetic chronic kidney disease: Secondary | ICD-10-CM | POA: Diagnosis present

## 2019-06-09 DIAGNOSIS — E876 Hypokalemia: Secondary | ICD-10-CM | POA: Diagnosis present

## 2019-06-09 DIAGNOSIS — J431 Panlobular emphysema: Secondary | ICD-10-CM | POA: Diagnosis present

## 2019-06-09 DIAGNOSIS — R9431 Abnormal electrocardiogram [ECG] [EKG]: Secondary | ICD-10-CM

## 2019-06-09 DIAGNOSIS — I4892 Unspecified atrial flutter: Secondary | ICD-10-CM | POA: Diagnosis present

## 2019-06-09 DIAGNOSIS — Z20828 Contact with and (suspected) exposure to other viral communicable diseases: Secondary | ICD-10-CM | POA: Diagnosis present

## 2019-06-09 DIAGNOSIS — I130 Hypertensive heart and chronic kidney disease with heart failure and stage 1 through stage 4 chronic kidney disease, or unspecified chronic kidney disease: Secondary | ICD-10-CM | POA: Diagnosis present

## 2019-06-09 DIAGNOSIS — Z9081 Acquired absence of spleen: Secondary | ICD-10-CM | POA: Diagnosis not present

## 2019-06-09 DIAGNOSIS — I2510 Atherosclerotic heart disease of native coronary artery without angina pectoris: Secondary | ICD-10-CM | POA: Diagnosis present

## 2019-06-09 DIAGNOSIS — J9621 Acute and chronic respiratory failure with hypoxia: Secondary | ICD-10-CM | POA: Diagnosis present

## 2019-06-09 DIAGNOSIS — N183 Chronic kidney disease, stage 3 (moderate): Secondary | ICD-10-CM | POA: Diagnosis present

## 2019-06-09 DIAGNOSIS — J9622 Acute and chronic respiratory failure with hypercapnia: Secondary | ICD-10-CM | POA: Diagnosis present

## 2019-06-09 LAB — BASIC METABOLIC PANEL
Anion gap: 14 (ref 5–15)
BUN: 60 mg/dL — ABNORMAL HIGH (ref 8–23)
CO2: 36 mmol/L — ABNORMAL HIGH (ref 22–32)
Calcium: 8.6 mg/dL — ABNORMAL LOW (ref 8.9–10.3)
Chloride: 88 mmol/L — ABNORMAL LOW (ref 98–111)
Creatinine, Ser: 2 mg/dL — ABNORMAL HIGH (ref 0.61–1.24)
GFR calc Af Amer: 38 mL/min — ABNORMAL LOW (ref >60)
GFR calc non Af Amer: 33 mL/min — ABNORMAL LOW (ref >60)
Glucose, Bld: 191 mg/dL — ABNORMAL HIGH (ref 70–99)
Potassium: 3.7 mmol/L (ref 3.5–5.1)
Sodium: 138 mmol/L (ref 135–145)

## 2019-06-09 LAB — CBC WITH DIFFERENTIAL/PLATELET
Abs Immature Granulocytes: 0.01 10*3/uL (ref 0.00–0.07)
Basophils Absolute: 0 10*3/uL (ref 0.0–0.1)
Basophils Relative: 0 %
Eosinophils Absolute: 0 10*3/uL (ref 0.0–0.5)
Eosinophils Relative: 0 %
HCT: 35.1 % — ABNORMAL LOW (ref 39.0–52.0)
Hemoglobin: 10.5 g/dL — ABNORMAL LOW (ref 13.0–17.0)
Immature Granulocytes: 0 %
Lymphocytes Relative: 17 %
Lymphs Abs: 0.8 10*3/uL (ref 0.7–4.0)
MCH: 25.9 pg — ABNORMAL LOW (ref 26.0–34.0)
MCHC: 29.9 g/dL — ABNORMAL LOW (ref 30.0–36.0)
MCV: 86.7 fL (ref 80.0–100.0)
Monocytes Absolute: 0.5 10*3/uL (ref 0.1–1.0)
Monocytes Relative: 11 %
Neutro Abs: 3.5 10*3/uL (ref 1.7–7.7)
Neutrophils Relative %: 72 %
Platelets: 393 10*3/uL (ref 150–400)
RBC: 4.05 MIL/uL — ABNORMAL LOW (ref 4.22–5.81)
RDW: 17.8 % — ABNORMAL HIGH (ref 11.5–15.5)
WBC: 4.8 10*3/uL (ref 4.0–10.5)
nRBC: 0 % (ref 0.0–0.2)

## 2019-06-09 LAB — GLUCOSE, CAPILLARY
Glucose-Capillary: 174 mg/dL — ABNORMAL HIGH (ref 70–99)
Glucose-Capillary: 200 mg/dL — ABNORMAL HIGH (ref 70–99)
Glucose-Capillary: 181 mg/dL — ABNORMAL HIGH (ref 70–99)
Glucose-Capillary: 147 mg/dL — ABNORMAL HIGH (ref 70–99)

## 2019-06-09 MED ORDER — OXYBUTYNIN CHLORIDE 5 MG PO TABS
5.0000 mg | ORAL_TABLET | Freq: Four times a day (QID) | ORAL | Status: DC
  Administered 2019-06-09 – 2019-06-13 (×17): 5 mg via ORAL
  Filled 2019-06-09 (×17): qty 1

## 2019-06-09 MED ORDER — BISACODYL 10 MG RE SUPP
10.0000 mg | Freq: Once | RECTAL | Status: AC
  Administered 2019-06-09: 10 mg via RECTAL
  Filled 2019-06-09: qty 1

## 2019-06-09 MED ORDER — METOLAZONE 5 MG PO TABS
5.0000 mg | ORAL_TABLET | Freq: Every day | ORAL | Status: DC
  Administered 2019-06-09 – 2019-06-11 (×3): 5 mg via ORAL
  Filled 2019-06-09 (×3): qty 1

## 2019-06-09 MED ORDER — DOCUSATE SODIUM 100 MG PO CAPS
200.0000 mg | ORAL_CAPSULE | Freq: Once | ORAL | Status: AC
  Administered 2019-06-09: 200 mg via ORAL
  Filled 2019-06-09: qty 2

## 2019-06-09 MED ORDER — ACETAZOLAMIDE 250 MG PO TABS
500.0000 mg | ORAL_TABLET | Freq: Two times a day (BID) | ORAL | Status: DC
  Administered 2019-06-09 – 2019-06-11 (×5): 500 mg via ORAL
  Filled 2019-06-09 (×5): qty 2

## 2019-06-09 MED ORDER — POTASSIUM CHLORIDE CRYS ER 20 MEQ PO TBCR
40.0000 meq | EXTENDED_RELEASE_TABLET | Freq: Once | ORAL | Status: AC
  Administered 2019-06-09: 40 meq via ORAL
  Filled 2019-06-09: qty 2

## 2019-06-09 MED ORDER — GUAIFENESIN ER 600 MG PO TB12
600.0000 mg | ORAL_TABLET | Freq: Two times a day (BID) | ORAL | Status: DC
  Administered 2019-06-09 – 2019-06-13 (×9): 600 mg via ORAL
  Filled 2019-06-09 (×9): qty 1

## 2019-06-09 MED ORDER — FUROSEMIDE 10 MG/ML IJ SOLN
80.0000 mg | Freq: Two times a day (BID) | INTRAMUSCULAR | Status: DC
  Administered 2019-06-09 – 2019-06-11 (×5): 80 mg via INTRAVENOUS
  Filled 2019-06-09 (×5): qty 8

## 2019-06-09 MED ORDER — POTASSIUM CHLORIDE ER 10 MEQ PO TBCR
20.0000 meq | EXTENDED_RELEASE_TABLET | Freq: Two times a day (BID) | ORAL | Status: DC
  Administered 2019-06-09 – 2019-06-10 (×3): 20 meq via ORAL
  Filled 2019-06-09 (×6): qty 2

## 2019-06-09 NOTE — TOC Initial Note (Signed)
Transition of Care [facility]) - Initial/Assessment Note    Patient Details  Name: Nicholas Caldwell MRN: [id number] Date of Birth: 04/01/1946  Transition of Care [facility]) CM/SW Contact:    Marilu Favre, RN Phone Number: 06/09/2019, 4:54 PM  Clinical Narrative:                  Confirmed face sheet information with wife and patient. Patient has walker and 3 in 1 at home. Messaged MD for [facility] and PT orders Expected Discharge Plan: [place] Barriers to Discharge: Continued Medical Work up   Patient Goals and CMS Choice Patient states their goals for this hospitalization and ongoing recovery are:: to return home CMS Medicare.gov Compare Post Acute Care list provided to:: Patient Choice offered to / list presented to : Patient  Expected Discharge Plan and Services Expected Discharge Plan: [place] Choice: [place] arrangements for the past 2 months: Single Family Home                 DME Arranged: N/A         HH Arranged: RN, PT [place] Agency: [place] ([place]) Date HH Agency Contacted: 06/09/19 Time [place]: [phone]    Prior Living Arrangements/Services Living arrangements for the past 2 months: [place] Lives with:: Spouse Patient language and need for interpreter reviewed:: Yes Do you feel safe going back to the place where you live?: Yes      Need for Family Participation in Patient Care: Yes (Comment) Care giver support system in place?: Yes (comment) Current home services: Home PT, Home RN([facility] [place]) Criminal Activity/Legal Involvement Pertinent to Current Situation/Hospitalization: Yes - Comment as needed  Activities of Daily Living Home Assistive Devices/Equipment: Cane (specify quad or straight), Walker (specify type) ADL Screening (condition at time of admission) Patient's cognitive ability adequate to safely complete daily activities?: No Is the patient  deaf or have difficulty hearing?: Yes Does the patient have difficulty seeing, even when wearing glasses/contacts?: No Does the patient have difficulty concentrating, remembering, or making decisions?: Yes Patient able to express need for assistance with ADLs?: Yes Does the patient have difficulty dressing or bathing?: No Independently performs ADLs?: Yes (appropriate for developmental age) Does the patient have difficulty walking or climbing stairs?: Yes Weakness of Legs: Both Weakness of Arms/Hands: None  Permission Sought/Granted         Permission granted to share info w AGENCY: [facility]        Emotional Assessment Appearance:: Appears stated age Attitude/Demeanor/Rapport: Engaged Affect (typically observed): Accepting Orientation: : Oriented to Self, Oriented to Place, Oriented to  Time, Oriented to Situation Alcohol / Substance Use: Not Applicable Psych Involvement: No (comment)  Admission diagnosis:  Acute on chronic systolic congestive heart failure (HCC) [I50.23] AKI (acute kidney injury) ([place]) [N17.9] Patient Active Problem List   Diagnosis Date Noted  . Lobar pneumonia ([place]) 04/14/2019  . Hyperkalemia 04/10/2019  . Cerebral embolism with cerebral infarction 03/21/2019  . Gastritis and gastroduodenitis   . NSVT (nonsustained ventricular tachycardia) ([place]) 03/08/2019  . Tobacco abuse counseling 03/08/2019  . Iron deficiency anemia   . Acute on chronic systolic CHF (congestive heart failure) ([place]) 03/06/2019  . Diabetes mellitus type 2 in obese ([place])   . Primary osteoarthritis of right hip   . Hypomagnesemia   . Steroid-induced hyperglycemia   . Supplemental oxygen dependent   . Leukocytosis   .  Hypokalemia   . Acute on chronic anemia   . Diabetic peripheral neuropathy ([place])   . Acute on chronic systolic (congestive) heart failure ([place])   . Acute on chronic respiratory failure ([place]) 01/14/2019  . Hypoxia   . Altered mental status   . Heart failure  ([place]) 12/30/2018  . AKI (acute kidney injury) ([place])   . Acute respiratory failure ([place]) 11/22/2018  . Acute on chronic respiratory failure with hypoxia and hypercapnia ([place]) 11/13/2018  . Metabolic encephalopathy 41/32/4401  . Acute respiratory failure with hypoxia and hypercapnia ([place]) 07/26/2018  . Acute exacerbation of CHF (congestive heart failure) ([place]) 07/26/2018  . CHF exacerbation ([place]) 05/01/2018  . CHF (congestive heart failure) ([place]) 04/30/2018  . Chronic respiratory failure with hypoxia ([place]) 12/28/2017  . Acute encephalopathy   . Atrial fibrillation with RVR ([place])   . SVT (supraventricular tachycardia) ([place])   . COPD GOLD 0   . Medically noncompliant   . Panlobular emphysema ([place])   . OSA (obstructive sleep apnea)   . Pulmonary hypertension ([place])   . Disorientation   . Pressure injury of skin 09/07/2016  . Acute on chronic combined systolic and diastolic CHF (congestive heart failure) ([place]) 09/05/2016  . Skin lesion-left heal 09/05/2016  . Chronic systolic CHF (congestive heart failure) ([place]) 07/16/2016  . COPD (chronic obstructive pulmonary disease) ([place]) 07/16/2016  . Uncontrolled type 2 diabetes mellitus with complication ([place])   . Diabetic polyneuropathy associated with diabetes mellitus due to underlying condition ([place])   . Normocytic anemia 06/29/2016  . CAD - Non-obstructive by LHC1/16 01/21/2016  . Prolonged QT interval 10/09/2015  . Nonischemic cardiomyopathy ([place]) 10/09/2015  . PAF (paroxysmal atrial fibrillation) ([place])   . Chronic pain 02/19/2015  . Morbid obesity ([place]) 02/13/2015  . Dyspnea   . Elevated troponin I level 11/01/2014  . COPD exacerbation ([place]) 11/01/2014  . Essential hypertension 10/31/2014  . Type 2 diabetes mellitus with neuropathy 10/31/2014  . Hyperlipidemia  10/31/2014  . Cigarette smoker 10/31/2014   PCP:  Clinic, Ali Molina:   [place], [phone]  [place] [address] Phone: [phone] Fax: [place], [place] AT [facility] [address] [place] [address] Phone: [phone] Fax: [phone]     Social Determinants of Health (SDOH) Interventions    Readmission Risk Interventions Readmission Risk Prevention Plan 04/14/2019 04/14/2019 03/31/2019  Transportation Screening Complete Complete Complete  Medication Review (RN Care Manager) Complete Complete Complete  PCP or Specialist appointment within 3-5 days of discharge Complete - Not Complete  PCP/Specialist Appt Not Complete comments 6/17 with cardiology - earliest appt with HF clinic is 6/5 at 10am, CM requested [place] PCP appt as well  [place] or Home Care Consult Complete - Complete  SW Recovery Care/Counseling Consult Complete - Complete  Palliative Care Screening Not Applicable - Not [place] Not Applicable - Patient Refused  Some recent data might be hidden

## 2019-06-09 NOTE — Progress Notes (Signed)
Pt called out help from room, nurse came in and pt was at sink with blood on him, floor gown ,telemetry box " I was washing up and my iv came out", pt was cleaned, new gown and socks placed, telemetry box cleaned and new pads placed back on pt, pt assisted to chair with walker to eat lunch per pt request, condom cath still in place, nurse Keniistra in to room and advised of pt iv out

## 2019-06-09 NOTE — Consult Note (Addendum)
Advanced Heart Failure Team Consult Note   Primary Physician: [facility] PCP-Cardiologist:  No primary care provider on file. HF MD: Dr [name]  Reason for Consultation: HF   HPI:    Nicholas Caldwell is seen today for evaluation of heart failure  at the request of Dr Benny Lennert.   Nicholas Caldwell is a 73 y.o. male with a history of chronic systolic CHFdue to NICM (EF 40-45%)with moderate RV failure, COPD on 3 L O2, DM, HTN, OHS/OSA on CPAP, PAF s/p DCCV, hyperlipidemia, CAD, and noncompliance.He has multiple admissions over past year due to HF and respiratory failure.   Admitted from 03/07/19-03/31/19 with massive volume overload with severe RV failure, AFL and cardiorenal syndrome. Attempts at diuresis limited by worsening creatinine which peaked at 2.6. Treated with milrinone but still unable to get CVP below 15. Hospital course c/b severe non-compliance with high fluid intake, PAFL, penile wound ,acute versus subacute CVA and Citrobacter UTII. Underwent DC-CV of AFL into NSR.  Readmitted6/6/2020with recurrent hypoxic/hypercarbic respiratory failure. (ABG 7.27/66/55/86%). Found to have severe hypekalemia (K>7.5) and large left pleural effusion. Weightwasup about 10 pounds since d/c.Diuresed with IV lasix and later transitioned to torsemide 80 mg twice a day.Per Dr [name] he was not a candidate for inotropes. Discharge weight on 04/14/2019 was 251 pounds.  Followed closely by HF Paramedicine and HF Team with video conferencing. He has not been interested in Hospice. He had been taking torsemide 80 mg twice and metolazone 2.5 mg 4 times a week.   Yesterday he [facility] EMS due to increased SOB. CXR concerning for CHF.Started on IV lasix 40 mg twice a day. Negative 1.9 liters. Pertinent admission labs included: BNP 2301, Lactic Acid 1.6, K 4, creatinine 2.25, and hgb 10.   Somnolent this morning. Denies SOB.   TEE in 3/20 with EF 40-45%, severely dilated RV with  moderately decreased systolic function  Review of Systems: [y] = yes, _0  = no   . General: Weight gain [Y ]; Weight loss _1 ; Anorexia _2 ; Fatigue [ Y]; Fever _3 ; Chills _4 ; Weakness [ Y]  . Cardiac: Chest pain/pressure _5 ; Resting SOB [Y ]; Exertional SOB [ Y]; Orthopnea _6 ; Pedal Edema _7 ; Palpitations _8 ; Syncope _9 ; Presyncope _10 ; Paroxysmal nocturnal dyspnea_11   . Pulmonary: Cough _12 ; Wheezing_13 ; Hemoptysis_14 ; Sputum _15 ; Snoring _16   . GI: Vomiting_17 ; Dysphagia_18 ; Melena_19 ; Hematochezia _20 ; Heartburn_21 ; Abdominal pain _22 ; Constipation _23 ; Diarrhea _24 ; BRBPR _25   . GU: Hematuria_26 ; Dysuria _27 ; Nocturia_28   . Vascular: Pain in legs with walking [Y ]; Pain in feet with lying flat _29 ; Non-healing sores _30 ; Stroke _31 ; TIA _32 ; Slurred speech _33 ;  . Neuro: Headaches_34 ; Vertigo_35 ; Seizures_36 ; Paresthesias_37 ;Blurred vision _38 ; Diplopia _39 ; Vision changes _40   . Ortho/Skin: Arthritis _41 ; Joint pain [Y ]; Muscle pain [Y ]; Joint swelling _42 ; Back Pain [Y ]; Rash _43   . Psych: Depression_44 ; Anxiety_45   . Heme: Bleeding problems _46 ; Clotting disorders _47 ; Anemia _48   . Endocrine: Diabetes [Y ]; Thyroid dysfunction_49   Home Medications Prior to Admission medications   Medication Sig Start Date End Date Taking? Authorizing Provider  albuterol (VENTOLIN HFA) 108 (90 Base) MCG/ACT inhaler Inhale 2 puffs into the lungs every 6 (six) hours as  needed for wheezing or shortness of breath.   Yes [provider]  apixaban (ELIQUIS) 5 MG TABS tablet Take 1 tablet (5 mg total) by mouth 2 (two) times daily. 03/31/19  Yes Kayleen Memos, DO  bisoprolol (ZEBETA) 5 MG tablet Take 0.5 tablets (2.5 mg total) by mouth at bedtime. 05/18/19  Yes Larey Dresser, MD  budesonide-formoterol [facility]) 160-4.5 MCG/ACT inhaler Inhale 2 puffs into the lungs as needed (shortness of breath).    Yes [provider]  fluticasone (FLONASE) 50 MCG/ACT nasal spray Place 2 sprays  into both nostrils daily as needed for allergies.    Yes [provider]  gabapentin (NEURONTIN) 600 MG tablet Take 1 tablet (600 mg total) by mouth 2 (two) times daily. Patient taking differently: Take 300 mg by mouth 2 (two) times daily.  01/24/19  Yes Angiulli, Lavon Paganini, PA-C  hydrocortisone (ANUSOL-HC) 2.5 % rectal cream Place 1 application rectally 2 (two) times daily as needed for hemorrhoids or itching.    Yes [provider]  hydrocortisone 1 % lotion Apply 1 application topically See admin instructions. Apply small amount to back twice daily for itchy rash as needed   Yes [provider]  insulin aspart (NOVOLOG) 100 UNIT/ML injection Inject 4 Units into the skin 3 (three) times daily with meals. 03/31/19  Yes Hall, Carole N, DO  insulin aspart protamine - aspart (NOVOLOG MIX 70/30 FLEXPEN) (70-30) 100 UNIT/ML FlexPen Inject 0.1 mLs (10 Units total) into the skin 2 (two) times daily. 03/31/19  Yes Hall, Carole N, DO  ipratropium-albuterol (DUONEB) 0.5-2.5 (3) MG/3ML SOLN Take 3 mLs by nebulization 2 (two) times daily as needed (shortness of breath/wheezing).    Yes [provider]  isosorbide-hydrALAZINE (BIDIL) 20-37.5 MG tablet Take 0.5 tablets by mouth 3 (three) times daily. 05/18/19  Yes Larey Dresser, MD  metolazone (ZAROXOLYN) 2.5 MG tablet Take 1 tablet (2.5 mg total) by mouth 4 (four) times a week. Take extra Potassium 59mq when taking this medication Patient taking differently: Take 2.5 mg by mouth 4 (four) times a week. Take extra Potassium 259m when taking this medication 05/19/19  Yes McLarey DresserMD  oxyCODONE-acetaminophen (PERCOCET) 10-325 MG tablet Take 1 tablet by mouth every 6 (six) hours as needed for pain.  05/16/19  Yes [provider]  OXYGEN Inhale 3 L into the lungs continuous.    Yes [provider]  pantoprazole (PROTONIX) 40 MG tablet Take 1 tablet (40 mg total) by mouth 2 (two) times daily. 05/05/19  Yes Clegg,  Amy D, NP  polyethylene glycol (MIRALAX / GLYCOLAX) packet Take 17 g by mouth daily. Patient taking differently: Take 17 g by mouth daily as needed (constipation).  01/24/19  Yes Angiulli, DaLavon PaganiniPA-C  potassium chloride SA (K-DUR) 20 MEQ tablet Take 2 tablets (40 mEq total) by mouth 2 (two) times daily. With additional 2045mwhen you take Metolazone. 06/02/19  Yes Clegg, Amy D, NP  PRESCRIPTION MEDICATION Cpap   Yes [provider]  rosuvastatin (CRESTOR) 20 MG tablet Take 1 tablet (20 mg total) by mouth daily at 6 PM. 05/18/19  Yes McLLarey DresserD  sodium chloride (OCEAN) 0.65 % SOLN nasal spray Place 2 sprays into both nostrils as needed for congestion.    Yes [provider]  tamsulosin (FLOMAX) 0.4 MG CAPS capsule Take 1 capsule (0.4 mg total) by mouth daily after supper. 04/20/19  Yes Clegg, Amy D, NP  torsemide (DEMADEX) 20 MG tablet  Take 4 tablets (80 mg total) by mouth 2 (two) times daily. 06/02/19  Yes Clegg, Amy D, NP    Past Medical History: Past Medical History:  Diagnosis Date  . Atrial flutter ([place])    a. recurrent AFlutter with RVR;  b. Amiodarone Rx started 4/16  . CAD (coronary artery disease)    a. LHC 1/16:  mLAD diffuse disease, pLCx mild disease, dLCx with disease but too small for PCI, RCA ok, EF 25-30%  . Chronic pain   . Chronic systolic CHF (congestive heart failure) ([place])   . COPD (chronic obstructive pulmonary disease) ([place])   . Diabetes mellitus without complication ([place])   . Hypercholesteremia   . Hypertension   . NICM (nonischemic cardiomyopathy) ([place])    a.dx 2016. b. 2D echo 06/2016 - Last echo 07/01/16: mod dilated LV, mod LVH, EF 25-30%, mild-mod MR, sev LAE, mild-mod reduced RV systolic function, mild-mod TR, PASP 69mHG.  .Marland KitchenPAF (paroxysmal atrial fibrillation) (HCC)    On amio - ot a candidate for flecainide due to cardiomyopathy, not a candidate for Tikosyn due to prolonged QT, and felt to be a poor candidate for ablation given left  atrial size.  . Pulmonary hypertension ([place]   . Tobacco abuse     Past Surgical History: Past Surgical History:  Procedure Laterality Date  . BIOPSY  03/11/2019   Procedure: BIOPSY;  Surgeon: CLavena Bullion DO;  [place]  Service: Gastroenterology;;  . BIOPSY  03/15/2019   Procedure: BIOPSY;  Surgeon: MIrving Copas, MD;  [place]  Service: Gastroenterology;;  . CARDIOVERSION N/A 07/30/2018   Procedure: CARDIOVERSION;  Surgeon: MLarey Dresser MD;  [facility]  Service: Cardiovascular;  Laterality: N/A;  . COLONOSCOPY N/A 03/11/2019   Procedure: COLONOSCOPY;  Surgeon: CLavena Bullion DO;  [place] [facility]  Service: Gastroenterology;  Laterality: N/A;  . ENTEROSCOPY N/A 03/15/2019   Procedure: ENTEROSCOPY;  Surgeon: MRush LandmarkGTelford Nab, MD;  [place]  Service: Gastroenterology;  Laterality: N/A;  . ESOPHAGOGASTRODUODENOSCOPY Left 03/11/2019   Procedure: ESOPHAGOGASTRODUODENOSCOPY (EGD);  Surgeon: CLavena Bullion DO;  [facility]  Service: Gastroenterology;  Laterality: Left;  . LEFT AND RIGHT HEART CATHETERIZATION WITH CORONARY ANGIOGRAM N/A 11/06/2014   Procedure: LEFT AND RIGHT HEART CATHETERIZATION WITH CORONARY ANGIOGRAM;  Surgeon: JJettie Booze MD;  [facility];  Service: Cardiovascular;  Laterality: N/A;  . RIGHT/LEFT HEART CATH AND CORONARY ANGIOGRAPHY N/A 12/06/2018   Procedure: RIGHT/LEFT HEART CATH AND CORONARY ANGIOGRAPHY;  Surgeon: MLarey Dresser MD;  [facility];  Service: Cardiovascular;  Laterality: N/A;  . SPLENECTOMY    . SUBMUCOSAL TATTOO INJECTION  03/15/2019   Procedure: SUBMUCOSAL TATTOO INJECTION;  Surgeon: MRush LandmarkGTelford Nab, MD;  [facility];  Service: Gastroenterology;;    Family History: Family History  Problem Relation Age of Onset  . Heart disease Mother   . Hypertension Mother   . Heart failure Mother   . Heart  disease Father   . Hypertension Sister   . Heart attack Neg Hx   . Stroke Neg Hx   . Colon cancer Neg Hx   . Esophageal cancer Neg Hx     Social History: Social History   Socioeconomic History  . Marital status: Married    Spouse name: Not on file  . Number of children: Not on file  . Years of education: Not on file  . Highest education level: Not on file  Occupational  History  . Not on file  Social Needs  . Financial resource strain: Not on file  . Food insecurity    Worry: Not on file    Inability: Not on file  . Transportation needs    Medical: Not on file    Non-medical: Not on file  Tobacco Use  . Smoking status: Former Smoker    Packs/day: 1.00    Years: 34.00    Pack years: 34.00    Types: Cigarettes    Quit date: 11/30/2018    Years since quitting: 0.5  . Smokeless tobacco: Never Used  Substance and Sexual Activity  . Alcohol use: No    Alcohol/week: 0.0 standard drinks  . Drug use: No  . Sexual activity: Not on file  Lifestyle  . Physical activity    Days per week: Not on file    Minutes per session: Not on file  . Stress: Not on file  Relationships  . Social Herbalist on phone: Not on file    Gets together: Not on file    Attends religious service: Not on file    Active member of club or organization: Not on file    Attends meetings of clubs or organizations: Not on file    Relationship status: Not on file  Other Topics Concern  . Not on file  Social History Narrative  . Not on file    Allergies:  No Known Allergies  Objective:    Vital Signs:   Temp:  [98.3 F (36.8 C)-98.7 F (37.1 C)] 98.7 F (37.1 C) (08/06 0417) Pulse Rate:  [50-77] 70 (08/06 0417) Resp:  [15-28] 20 (08/06 0417) BP: (107-143)/(47-71) 112/56 (08/06 0417) SpO2:  [87 %-100 %] 95 % (08/06 0417)    Weight change: Filed Weights   06/08/19 0444  Weight: 114.8 kg    Intake/Output:   Intake/Output Summary (Last 24 hours) at 06/09/2019 0816 Last data  filed at 06/09/2019 0524 Gross per 24 hour  Intake 600 ml  Output 1750 ml  Net -1150 ml      Physical Exam    General:Appears chronically ill.  No resp difficulty. Somnolent  HEENT: normal Neck: supple. JVP 10-11 . Carotids 2+ bilat; no bruits. No lymphadenopathy or thyromegaly appreciated. Cor: PMI nondisplaced. Regular rate & rhythm. No rubs, gallops or murmurs. Lungs: Shallow breaths with EW throughout on 3 liters.  Abdomen: obese, soft, nontender, nondistended. No hepatosplenomegaly. No bruits or masses. Good bowel sounds. Extremities: no cyanosis, clubbing, rash, R and LLE 1+ edema Neuro: alert & orientedx3, cranial nerves grossly intact. moves all 4 extremities w/o difficulty. Affect flat   Telemetry   SB 50s personally reviewed.   EKG    SR 71 bpm   Labs   Basic Metabolic Panel: Recent Labs  Lab 06/08/19 0456 06/09/19 0440  NA 137 138  K 4.0 3.7  CL 90* 88*  CO2 34* 36*  GLUCOSE 224* 191*  BUN 56* 60*  CREATININE 2.25* 2.00*  CALCIUM 8.8* 8.6*    Liver Function Tests: No results for input(s): AST, ALT, ALKPHOS, BILITOT, PROT, ALBUMIN in the last 168 hours. No results for input(s): LIPASE, AMYLASE in the last 168 hours. No results for input(s): AMMONIA in the last 168 hours.  CBC: Recent Labs  Lab 06/08/19 0456 06/08/19 2339  WBC 6.3 4.8  NEUTROABS 4.3 3.5  HGB 10.1* 10.5*  HCT 35.2* 35.1*  MCV 89.8 86.7  PLT 373 393    Cardiac Enzymes: No  results for input(s): CKTOTAL, CKMB, CKMBINDEX, TROPONINI in the last 168 hours.  BNP: BNP (last 3 results) Recent Labs    03/06/19 1829 04/09/19 1959 06/08/19 0446  BNP 1,020.1* 1,843.4* 2,301.2*    ProBNP (last 3 results) No results for input(s): PROBNP in the last 8760 hours.   CBG: Recent Labs  Lab 06/08/19 1307 06/08/19 1740 06/08/19 2107 06/09/19 0414  GLUCAP 205* 326* 289* 174*    Coagulation Studies: No results for input(s): LABPROT, INR in the last 72 hours.   Imaging    No  results found.   Medications:     Current Medications: . apixaban  5 mg Oral BID  . aspirin EC  81 mg Oral Daily  . bisoprolol  2.5 mg Oral QHS  . furosemide  40 mg Intravenous Q12H  . gabapentin  300 mg Oral BID  . insulin aspart  0-15 Units Subcutaneous TID WC  . insulin aspart  0-5 Units Subcutaneous QHS  . insulin aspart protamine- aspart  10 Units Subcutaneous BID WC  . isosorbide-hydrALAZINE  0.5 tablet Oral TID  . mometasone-formoterol  2 puff Inhalation BID  . rosuvastatin  20 mg Oral q1800  . sodium chloride flush  3 mL Intravenous Q12H  . tamsulosin  0.4 mg Oral QPC supper     Infusions: . sodium chloride         Assessment/Plan    1. A/C Biventricular HF: NICM Nonischemic cardiomyopathy.With prominent RV failure, likely related to COPD/OSA/OHS. TEE in 3/20 with EF 40-45%, severely dilated RV with moderately decreased systolic function.  CXR on admit with CHF.  - Volume status elevated. Increase lasix to 80 mg twice a day. Prior to admit he was taking torsemide 80 mg bid + metolazone 4 times a week. - Hold bisoprolol for now.  Continue bidil 0.5 mg three times a day.  - No spiro or dig with elevated creatinine.   2. COPD with acute on chronic hypoxic/hypercarbic respiratory failure Frequent admits with hypoxic/hypercarbic respiratory failure. Somulent this morning.  - Check ABG now. Wheezing this morning. On nebs per primary. May need steroids.   3. CKD Stage III Creatinine 2.25 on admit and down to 2. Follow BMET daily.   4. OSA He does wear CPAP at home.   5. PAF In Sinus Loletha Grayer today.  -Hold bisoprolol with acute decompensation.  - Continue eliquis 5 mg twice a day.   6. H/O CVA  On eliquis + statin.   7. CAD  Nonobstructive on cath. No chest pain. HS Trop 58-59  On statin.   8. DMIII   Refusing PT and weight. Needs goals of care but he has been refusing.   Length of Stay: 0  Darrick Grinder, NP  06/09/2019, 8:16 AM  Advanced Heart Failure  Team Pager [phone] (M-F; 7a - 4p)  Please contact [facility] for night-coverage after hours (4p -7a ) and weekends on [email or web address]  Patient seen and examined with Darrick Grinder, NP. We discussed all aspects of the encounter. I agree with the assessment and plan as stated above.   73 y/o male well known to HF team due to multiple recent admissions for diastolic HF (R>>L) and recurrent hypoxic/hypercarbic respiratory failure. He also has severe nomcompliance with dietary restriction.   Readmitted yesterday with recurrent volume overload weight 251 -> 263 and worsening respiratory failure.   He is diuresing sluggishly. Breathing improving but still tight on exam. Creatinine back to baseline of 2.0  One exam Chronically ill but NAD  this am JVP to jaw Cor RRR 2/6 TR Lungs tight with mild wheezing Ab obese NT Ext warm 2+ edema R>L   He is volume overloaded but not as bad as it has been in the past. Hopefully we can get him back to baseline in a few days. Will increase lasix to 80 IV bid (may need more) and add daily metolazone and diamox 500 bid.   May need treatment for COPD flare as well. Stressed need for volume restriction.   Glori Bickers, MD  10:13 AM

## 2019-06-09 NOTE — Care Management Obs Status (Signed)
[place] NOTIFICATION   Patient Details  Name: Shine Mikes MRN: [id number] Date of Birth: 19-Jun-1946   Medicare Observation Status Notification Given:  Yes    Marilu Favre, RN 06/09/2019, 2:51 PM

## 2019-06-09 NOTE — Progress Notes (Signed)
Patient placed on auto titrate CPAP at this time. CPAP is currently on a pressure of 6 and has 4 liter bled in. Patient states he is comfortable at this time. RT will continue to monitor.

## 2019-06-09 NOTE — TOC Initial Note (Addendum)
Transition of Care [facility]) - Initial/Assessment Note    Patient Details  Name: Nicholas Caldwell MRN: [id number] Date of Birth: 1946/03/23  [facility] CM/SW Contact:    Marilu Favre, RN Phone Number: 06/09/2019, 2:52 PM  Clinical Narrative:                 Patient from home with wife. [facility]. Called Valerie with [facility] to confirm. Patient has been discharged from [facility] checking to see if they can accept back.    Expected Discharge Plan: [place] Barriers to Discharge: Continued Medical Work up   Patient Goals and CMS Choice Patient states their goals for this hospitalization and ongoing recovery are:: to go home CMS Medicare.gov Compare Post Acute Care list provided to:: Patient Choice offered to / list presented to : Patient  Expected Discharge Plan and Services Expected Discharge Plan: [place] Choice: [place] arrangements for the past 2 months: Single Family Home                 DME Arranged: N/A                    Prior Living Arrangements/Services Living arrangements for the past 2 months: Single Family Home Lives with:: Spouse Patient language and need for interpreter reviewed:: Yes Do you feel safe going back to the place where you live?: Yes      Need for Family Participation in Patient Care: Yes (Comment) Care giver support system in place?: Yes (comment) Current home services: Home PT, Home RN([facility]) Criminal Activity/Legal Involvement Pertinent to Current Situation/Hospitalization: No - Comment as needed  Activities of Daily Living Home Assistive Devices/Equipment: Cane (specify quad or straight), Walker (specify type) ADL Screening (condition at time of admission) Patient's cognitive ability adequate to safely complete daily activities?: No Is the patient deaf or have difficulty hearing?: Yes Does the  patient have difficulty seeing, even when wearing glasses/contacts?: No Does the patient have difficulty concentrating, remembering, or making decisions?: Yes Patient able to express need for assistance with ADLs?: Yes Does the patient have difficulty dressing or bathing?: No Independently performs ADLs?: Yes (appropriate for developmental age) Does the patient have difficulty walking or climbing stairs?: Yes Weakness of Legs: Both Weakness of Arms/Hands: None  Permission Sought/Granted         Permission granted to share info w AGENCY: [facility]        Emotional Assessment Appearance:: Appears stated age Attitude/Demeanor/Rapport: Engaged Affect (typically observed): Accepting Orientation: : Oriented to Place, Oriented to Self, Oriented to  Time, Oriented to Situation Alcohol / Substance Use: Not Applicable Psych Involvement: No (comment)  Admission diagnosis:  Acute on chronic systolic congestive heart failure (HCC) [I50.23] AKI (acute kidney injury) ([place]) [N17.9] Patient Active Problem List   Diagnosis Date Noted  . Lobar pneumonia ([place]) 04/14/2019  . Hyperkalemia 04/10/2019  . Cerebral embolism with cerebral infarction 03/21/2019  . Gastritis and gastroduodenitis   . NSVT (nonsustained ventricular tachycardia) ([place]) 03/08/2019  . Tobacco abuse counseling 03/08/2019  . Iron deficiency anemia   . Acute on chronic systolic CHF (congestive heart failure) ([place]) 03/06/2019  . Diabetes mellitus type 2 in obese ([place])   . Primary osteoarthritis of right hip   . Hypomagnesemia   . Steroid-induced hyperglycemia   . Supplemental oxygen dependent   . Leukocytosis   .  Hypokalemia   . Acute on chronic anemia   . Diabetic peripheral neuropathy ([place])   . Acute on chronic systolic (congestive) heart failure ([place])   . Acute on chronic respiratory failure ([place]) 01/14/2019  . Hypoxia   . Altered mental status   . Heart failure ([place]) 12/30/2018  . AKI (acute kidney injury)  ([place])   . Acute respiratory failure ([place]) 11/22/2018  . Acute on chronic respiratory failure with hypoxia and hypercapnia ([place]) 11/13/2018  . Metabolic encephalopathy 72/76/1848  . Acute respiratory failure with hypoxia and hypercapnia ([place]) 07/26/2018  . Acute exacerbation of CHF (congestive heart failure) ([place]) 07/26/2018  . CHF exacerbation ([place]) 05/01/2018  . CHF (congestive heart failure) ([place]) 04/30/2018  . Chronic respiratory failure with hypoxia ([place]) 12/28/2017  . Acute encephalopathy   . Atrial fibrillation with RVR ([place])   . SVT (supraventricular tachycardia) ([place])   . COPD GOLD 0   . Medically noncompliant   . Panlobular emphysema ([place])   . OSA (obstructive sleep apnea)   . Pulmonary hypertension ([place])   . Disorientation   . Pressure injury of skin 09/07/2016  . Acute on chronic combined systolic and diastolic CHF (congestive heart failure) ([place]) 09/05/2016  . Skin lesion-left heal 09/05/2016  . Chronic systolic CHF (congestive heart failure) ([place]) 07/16/2016  . COPD (chronic obstructive pulmonary disease) ([place]) 07/16/2016  . Uncontrolled type 2 diabetes mellitus with complication ([place])   . Diabetic polyneuropathy associated with diabetes mellitus due to underlying condition ([place])   . Normocytic anemia 06/29/2016  . CAD - Non-obstructive by LHC1/16 01/21/2016  . Prolonged QT interval 10/09/2015  . Nonischemic cardiomyopathy ([place]) 10/09/2015  . PAF (paroxysmal atrial fibrillation) ([place])   . Chronic pain 02/19/2015  . Morbid obesity ([place]) 02/13/2015  . Dyspnea   . Elevated troponin I level 11/01/2014  . COPD exacerbation ([place]) 11/01/2014  . Essential hypertension 10/31/2014  . Type 2 diabetes mellitus with neuropathy 10/31/2014  . Hyperlipidemia  10/31/2014  . Cigarette smoker 10/31/2014   PCP:  Clinic, [place]:   [place], [place] [phone] [place]  [address] Phone: [phone] Fax: [place], [place] AT [facility] OF [address] [place] [address] Phone: [phone] Fax: [phone]     Social Determinants of Health (SDOH) Interventions    Readmission Risk Interventions Readmission Risk Prevention Plan 04/14/2019 04/14/2019 03/31/2019  Transportation Screening Complete Complete Complete  Medication Review (RN Care Manager) Complete Complete Complete  PCP or Specialist appointment within 3-5 days of discharge Complete - Not Complete  PCP/Specialist Appt Not Complete comments 6/17 with cardiology - earliest appt with HF clinic is 6/5 at 10am, CM requested [place] PCP appt as well  [place] or Home Care Consult Complete - Complete  SW Recovery Care/Counseling Consult Complete - Complete  Palliative Care Screening Not Applicable - Not [place] Not Applicable - Patient Refused  Some recent data might be hidden

## 2019-06-09 NOTE — Evaluation (Signed)
Physical Therapy Evaluation Patient Details Name: Nicholas Caldwell MRN: [id number] DOB: 1946/07/26 Today's Date: 06/09/2019   History of Present Illness  73 yo admitted with SOB, CHF and AKI. PMHx: Afib, CAD, CHF, COPD on hoem O2, DM, HTN, HLD  Clinical Impression  Pt with eyes closed on arrival but responding to name and questions. Pt stating "Uh-huh" for willingness to move and walk but then demonstrated delayed response to cue and questions with maintained uh-huh answers until EOB with slow initiation and processing. Pt with decreased strength, transfers, mobility and safety who relies on wife assist and use of RW at home. Pt reports he is receiving HHPT 1x/wk. Pt will benefit from acute therapy to maximize mobility, safety and gait to decrease burden of care. REcommend daily mobility with nursing and OOB to chair for all meals.   HR 61-71 Pt on 3L on arrival with SpO2 90% with drop to 85% during mobility and 4L to recover to 92% at rest    Follow Up Recommendations Home health PT;Supervision for mobility/OOB    Equipment Recommendations  None recommended by PT    Recommendations for Other Services       Precautions / Restrictions Precautions Precautions: Fall Precaution Comments: watch sats      Mobility  Bed Mobility Overal bed mobility: Needs Assistance Bed Mobility: Supine to Sit     Supine to sit: Supervision;HOB elevated     General bed mobility comments: HOb 30 degrees with pt using rail, momentum and increased time to get to EOb, pt denied need for assist to rise  Transfers Overall transfer level: Needs assistance   Transfers: Sit to/from Stand Sit to Stand: Min assist         General transfer comment: min assist to rise from surface with bed height slightly elevated, cues for hand placement with assist for anterior translation and power up  Ambulation/Gait Ambulation/Gait assistance: Min guard Gait Distance (Feet): 60 Feet Assistive device: Rolling walker  (2 wheeled) Gait Pattern/deviations: Step-through pattern;Decreased stride length;Trunk flexed   Gait velocity interpretation: 1.31 - 2.62 ft/sec, indicative of limited community ambulator General Gait Details: pt with slow steady gait with trunk flexed and heavy reliance on RW to support weight with fatigue limiting distance, cues for posture, proximity and directional cues  Stairs            Wheelchair Mobility    Modified Rankin (Stroke Patients Only)       Balance Overall balance assessment: Needs assistance   Sitting balance-Leahy Scale: Fair Sitting balance - Comments: pt able to sit EOB without UE support   Standing balance support: Bilateral upper extremity supported Standing balance-Leahy Scale: Poor Standing balance comment: reliant on bil UE support for standing                             Pertinent Vitals/Pain Pain Assessment: No/denies pain    Home Living Family/patient expects to be discharged to:: Private residence Living Arrangements: Spouse/significant other Available Help at Discharge: Family;Available 24 hours/day Type of Home: House Home Access: Stairs to enter   [employer] of Steps: 1 Home Layout: One level Home Equipment: Walker - 2 wheels;Cane - single point;Bedside commode;Shower seat Additional Comments: on 3L of O2 at all times at home    Prior Function Level of Independence: Needs assistance   Gait / Transfers Assistance Needed: uses RW for mobility  ADL's / Homemaking Assistance Needed: assist from wife for LB ADL and  wife does IADL        Hand Dominance        Extremity/Trunk Assessment   Upper Extremity Assessment Upper Extremity Assessment: Generalized weakness    Lower Extremity Assessment Lower Extremity Assessment: Generalized weakness    Cervical / Trunk Assessment Cervical / Trunk Assessment: Kyphotic  Communication   Communication: No difficulties  Cognition Arousal/Alertness:  Awake/alert Behavior During Therapy: Flat affect Overall Cognitive Status: Impaired/Different from baseline Area of Impairment: Problem solving                             Problem Solving: Slow processing;Requires verbal cues General Comments: pt very slow to respond to questions and commands with delayed initiation      General Comments      Exercises     Assessment/Plan    PT Assessment Patient needs continued PT services  PT Problem List Decreased strength;Decreased mobility;Decreased safety awareness;Decreased balance;Decreased activity tolerance;Decreased knowledge of use of DME;Cardiopulmonary status limiting activity;Decreased cognition       PT Treatment Interventions DME instruction;Therapeutic activities;Therapeutic exercise;Gait training;Patient/family education;Functional mobility training;Balance training    PT Goals (Current goals can be found in the Care Plan section)  Acute Rehab PT Goals Patient Stated Goal: return home PT Goal Formulation: With patient Time For Goal Achievement: 06/23/19 Potential to Achieve Goals: Fair    Frequency Min 3X/week   Barriers to discharge        Co-evaluation               AM-PAC PT "6 Clicks" Mobility  Outcome Measure Help needed turning from your back to your side while in a flat bed without using bedrails?: A Little Help needed moving from lying on your back to sitting on the side of a flat bed without using bedrails?: A Little Help needed moving to and from a bed to a chair (including a wheelchair)?: A Little Help needed standing up from a chair using your arms (e.g., wheelchair or bedside chair)?: A Little Help needed to walk in hospital room?: A Little Help needed climbing 3-5 steps with a railing? : A Lot 6 Click Score: 17    End of Session Equipment Utilized During Treatment: Gait belt;Oxygen Activity Tolerance: Patient tolerated treatment well Patient left: in chair;with call bell/phone  within reach;with chair alarm set;with nursing/sitter in room Nurse Communication: Mobility status PT Visit Diagnosis: Other abnormalities of gait and mobility (R26.89);Muscle weakness (generalized) (M62.81);Difficulty in walking, not elsewhere classified (R26.2)    Time: 0922-0950 PT Time Calculation (min) (ACUTE ONLY): 28 min   Charges:   PT Evaluation $PT Eval Moderate Complexity: 1 Mod PT Treatments $Therapeutic Activity: 8-22 mins        Key Cen Pam Drown, PT Acute Rehabilitation Services Pager: [phone] Office: [place] Avyanna Spada 06/09/2019, 1:15 PM

## 2019-06-09 NOTE — Progress Notes (Signed)
Order to remove foley catheter, order entered and cath removed and intact Neta Mends RN 12:55 PM 06-09-2019

## 2019-06-09 NOTE — Progress Notes (Signed)
PROGRESS NOTE  Dontavis Tschantz ION:[id number] DOB: Jan 22, 1946 DOA: 06/08/2019 PCP: [facility]  Brief History   Harlin Mazzoni is a 73 y.o. male with medical history significant of afib; chronic systolic CHF; HTN; HLD; DM: COPD on 3L home O2; CAD; and chronic pain presenting with SOB.  The patient was somnolent at the time of my evaluation (wife reports that he stays up late every night and sleeps until noon daily) and so was unable/unwilling to answer questions.  He was somnolent but in NAD.  HPI per Dr. Dina Rich: Patient reports 3-day history of worsening shortness of breath. He reports orthopnea. He has had occasional chest pain but no chest pain right now. He at baseline is on 3 L of nasal cannula while at home. He denies any recent changes in medication and reports compliance with medication. He states he has had difficulty urinating and has not urinated in over 24 hours. He also reports 4 to 5-day history of no bowel movement. Denies any abdominal pain. Denies fever or cough. He has been using his inhaler at home with minimal relief. He has noted lower extremity swelling and weight gain.  The patient was admitted to a telemetry bed. Cardiology was consulted. The patient is being diuresed.   Consultants  . Cardiology  Procedures  . None  Antibiotics   Anti-infectives (From admission, onward)   None    .  [place] Kitchen   Subjective  The patient is resting comfortably. No new complaints.  Objective   Vitals:  Vitals:   06/09/19 0417 06/09/19 1058  BP: (!) 112/56 103/76  Pulse: 70 63  Resp: 20 18  Temp: 98.7 F (37.1 C) 98.2 F (36.8 C)  SpO2: 95% 97%    Exam:  Constitutional:  The patient is awake, alert, and oriented x 3. No acute distress. Respiratory:  . No increased work of breathing. . No wheezes or rhonchi . Positive for diffuse rales. . No tactile fremitus. Cardiovascular:  . Regular rate and rhythm. . No murmurs, ectopy, or gallups . No lateral PMI. No  thrills. Abdomen:  . Abdomen is soft, non-tender, non-distended. . No hernias, masses, or organomegaly are appreciated. . Normoactive bowel sounds.  Musculoskeletal:  . No cyanosis or clubbing . Positive for 2-3+ pitting edema Skin:  . No rashes, lesions, ulcers . palpation of skin: no induration or nodules Neurologic:  . CN 2-12 intact . Sensation all 4 extremities intact Psychiatric:  . Mental status o Mood, affect appropriate o Orientation to person, place, time  . judgment and insight appear intact     I have personally reviewed the following:   Today's Data  . Vitals . CBC, BMP  Scheduled Meds: . acetaZOLAMIDE  500 mg Oral BID  . apixaban  5 mg Oral BID  . aspirin EC  81 mg Oral Daily  . furosemide  80 mg Intravenous Q12H  . gabapentin  300 mg Oral BID  . guaiFENesin  600 mg Oral BID  . insulin aspart  0-15 Units Subcutaneous TID WC  . insulin aspart  0-5 Units Subcutaneous QHS  . insulin aspart protamine- aspart  10 Units Subcutaneous BID WC  . isosorbide-hydrALAZINE  0.5 tablet Oral TID  . metolazone  5 mg Oral Daily  . mometasone-formoterol  2 puff Inhalation BID  . oxybutynin  5 mg Oral QID  . potassium chloride  20 mEq Oral BID  . rosuvastatin  20 mg Oral q1800  . sodium chloride flush  3 mL Intravenous Q12H  Continuous Infusions: . sodium chloride      Principal Problem:   Acute on chronic systolic (congestive) heart failure (HCC) Active Problems:   Essential hypertension   Hyperlipidemia    Prolonged QT interval   COPD (chronic obstructive pulmonary disease) (HCC)   OSA (obstructive sleep apnea)   AKI (acute kidney injury) ([place])   Diabetes mellitus type 2 in obese ([place])   LOS: 0 days   A & P  Acute on chronic CHF exacerbation: Echocardiogram from 01/2019 demonstrated dilated right sided ventricle and LVEF of 40%. CXR demonstrates pulmonary edema. Cardiology has been consulted. Zaroxylyn has been held and acetazolamide has been started. Beta  blockers have been held due to acutely worsened CHF. Troponins have been flat. EKG demonstrate no ischemia.  AKI on stage 3 CKD: Likely associated with decreased renal perfusion in the setting of CHF exacerbation. Creatinine is slightly improved to 2.0 with diuresis.  Prolonged QT: Normal K+, but QTc is persistently elevated on EKG this morning. Avoid agents which could further prolong QT. Monitor on telemetry.  OSA: Continue CPAP qhs  COPD: Stable. The patient has been continued on dulera. Albuterol on an as needed basis.  Afib: Rate is controlled with bisoprolol. Continue Eliquis.  HTN: Continue bisoprolol, Bidil  DM: Continue 70/30. Will cover with moderate-scale SSI for now.  I have seen and examined this patient myself. I have spent 35 minutes in his evaluation and care.  DVT prophylaxis: Eliquis  Code Status:  Full - confirmed with patient Family Communication: None present Disposition Plan:  Home once clinically improved  Perla Echavarria, DO [facility] Direct contact: see [email or web address]  7PM-7AM contact night coverage as above 06/09/2019, 5:07 PM  LOS: 0 days

## 2019-06-10 DIAGNOSIS — J9621 Acute and chronic respiratory failure with hypoxia: Secondary | ICD-10-CM

## 2019-06-10 LAB — CBC WITH DIFFERENTIAL/PLATELET
Abs Immature Granulocytes: 0.02 10*3/uL (ref 0.00–0.07)
Basophils Absolute: 0 10*3/uL (ref 0.0–0.1)
Basophils Relative: 1 %
Eosinophils Absolute: 0.1 10*3/uL (ref 0.0–0.5)
Eosinophils Relative: 1 %
HCT: 32.4 % — ABNORMAL LOW (ref 39.0–52.0)
Hemoglobin: 9.4 g/dL — ABNORMAL LOW (ref 13.0–17.0)
Immature Granulocytes: 0 %
Lymphocytes Relative: 11 %
Lymphs Abs: 0.7 10*3/uL (ref 0.7–4.0)
MCH: 25.8 pg — ABNORMAL LOW (ref 26.0–34.0)
MCHC: 29 g/dL — ABNORMAL LOW (ref 30.0–36.0)
MCV: 89 fL (ref 80.0–100.0)
Monocytes Absolute: 0.8 10*3/uL (ref 0.1–1.0)
Monocytes Relative: 13 %
Neutro Abs: 4.8 10*3/uL (ref 1.7–7.7)
Neutrophils Relative %: 74 %
Platelets: 374 10*3/uL (ref 150–400)
RBC: 3.64 MIL/uL — ABNORMAL LOW (ref 4.22–5.81)
RDW: 18.2 % — ABNORMAL HIGH (ref 11.5–15.5)
WBC: 6.5 10*3/uL (ref 4.0–10.5)
nRBC: 0 % (ref 0.0–0.2)

## 2019-06-10 LAB — BASIC METABOLIC PANEL
Anion gap: 13 (ref 5–15)
BUN: 65 mg/dL — ABNORMAL HIGH (ref 8–23)
CO2: 36 mmol/L — ABNORMAL HIGH (ref 22–32)
Calcium: 8.6 mg/dL — ABNORMAL LOW (ref 8.9–10.3)
Chloride: 89 mmol/L — ABNORMAL LOW (ref 98–111)
Creatinine, Ser: 2.35 mg/dL — ABNORMAL HIGH (ref 0.61–1.24)
GFR calc Af Amer: 31 mL/min — ABNORMAL LOW (ref >60)
GFR calc non Af Amer: 27 mL/min — ABNORMAL LOW (ref >60)
Glucose, Bld: 134 mg/dL — ABNORMAL HIGH (ref 70–99)
Potassium: 3.5 mmol/L (ref 3.5–5.1)
Sodium: 138 mmol/L (ref 135–145)

## 2019-06-10 LAB — GLUCOSE, CAPILLARY
Glucose-Capillary: 119 mg/dL — ABNORMAL HIGH (ref 70–99)
Glucose-Capillary: 151 mg/dL — ABNORMAL HIGH (ref 70–99)
Glucose-Capillary: 169 mg/dL — ABNORMAL HIGH (ref 70–99)
Glucose-Capillary: 200 mg/dL — ABNORMAL HIGH (ref 70–99)
Glucose-Capillary: 214 mg/dL — ABNORMAL HIGH (ref 70–99)
Glucose-Capillary: 215 mg/dL — ABNORMAL HIGH (ref 70–99)

## 2019-06-10 MED ORDER — POTASSIUM CHLORIDE CRYS ER 10 MEQ PO TBCR
40.0000 meq | EXTENDED_RELEASE_TABLET | Freq: Two times a day (BID) | ORAL | Status: DC
  Administered 2019-06-10 – 2019-06-13 (×6): 40 meq via ORAL
  Filled 2019-06-10 (×9): qty 4

## 2019-06-10 MED ORDER — OXYCODONE HCL 5 MG PO TABS
5.0000 mg | ORAL_TABLET | ORAL | Status: DC | PRN
  Administered 2019-06-10 – 2019-06-13 (×12): 5 mg via ORAL
  Filled 2019-06-10 (×12): qty 1

## 2019-06-10 MED ORDER — OXYCODONE-ACETAMINOPHEN 5-325 MG PO TABS
1.0000 | ORAL_TABLET | ORAL | Status: DC | PRN
  Administered 2019-06-10 – 2019-06-13 (×12): 1 via ORAL
  Filled 2019-06-10 (×12): qty 1

## 2019-06-10 MED ORDER — POTASSIUM CHLORIDE CRYS ER 20 MEQ PO TBCR
20.0000 meq | EXTENDED_RELEASE_TABLET | Freq: Once | ORAL | Status: AC
  Administered 2019-06-10: 20 meq via ORAL
  Filled 2019-06-10: qty 1

## 2019-06-10 MED ORDER — TAMSULOSIN HCL 0.4 MG PO CAPS
0.4000 mg | ORAL_CAPSULE | Freq: Every day | ORAL | Status: DC
  Administered 2019-06-10 – 2019-06-12 (×3): 0.4 mg via ORAL
  Filled 2019-06-10 (×3): qty 1

## 2019-06-10 NOTE — TOC Progression Note (Signed)
Transition of Care [facility]) - Progression Note    Patient Details  Name: Nicholas Caldwell MRN: [id number] Date of Birth: [birth year]  Transition of Care [facility]) CM/SW Contact  Nikole Swartzentruber, Edson Snowball, RN Phone Number: 06/10/2019, 7:53 AM  Clinical Narrative:     Mateo Flow with [facility] accepted referral for home health RN and PT . Will need orders for [facility] and HHPT . Messaged MD.  Expected Discharge Plan: [place] Barriers to Discharge: Continued Medical Work up  Expected Discharge Plan and Services Expected Discharge Plan: [place] Choice: [place] arrangements for the past 2 months: Single Family Home                 DME Arranged: N/A         HH Arranged: RN, PT [place] Agency: [place] ([facility]) Date HH Agency Contacted: 06/09/19 Time [place]: [place]     Social Determinants of Health (SDOH) Interventions    Readmission Risk Interventions Readmission Risk Prevention Plan 04/14/2019 04/14/2019 03/31/2019  Transportation Screening Complete Complete Complete  Medication Review (RN Care Manager) Complete Complete Complete  PCP or Specialist appointment within 3-5 days of discharge Complete - Not Complete  PCP/Specialist Appt Not Complete comments 6/17 with cardiology - earliest appt with HF clinic is 6/5 at 10am, CM requested [place] PCP appt as well  [place] or Home Care Consult Complete - Complete  SW Recovery Care/Counseling Consult Complete - Complete  Palliative Care Screening Not Applicable - Not [place] Not Applicable - Patient Refused  Some recent data might be hidden

## 2019-06-10 NOTE — Progress Notes (Signed)
Patient ID: Nicholas Caldwell, male   DOB: June 04, 1946, 73 y.o.   MRN: [id number]     Advanced Heart Failure Rounding Note  PCP-Cardiologist: No primary care provider on file.   Subjective:    Not short of breath at rest.  I/Os not recorded until this morning.  He refused to stand for a weight.  Creatinine is up from 2 => 2.35.    Objective:   Weight Range: 119.9 kg Body mass index is 35.85 kg/m.   Vital Signs:   Temp:  [97.3 F (36.3 C)-98.5 F (36.9 C)] 97.3 F (36.3 C) (08/07 1214) Pulse Rate:  [60-67] 67 (08/07 1214) Resp:  [16-18] 18 (08/07 1214) BP: (93-110)/(54-71) 110/59 (08/07 1214) SpO2:  [91 %-98 %] 92 % (08/07 1214)    Weight change: Filed Weights   06/08/19 0444 06/09/19 0900  Weight: 114.8 kg 119.9 kg    Intake/Output:   Intake/Output Summary (Last 24 hours) at 06/10/2019 1251 Last data filed at 06/10/2019 1216 Gross per 24 hour  Intake 483 ml  Output 1100 ml  Net -617 ml      Physical Exam    General:  Well appearing. No resp difficulty HEENT: Normal Neck: Thick. JVP 14+ cm. Carotids 2+ bilat; no bruits. No lymphadenopathy or thyromegaly appreciated. Cor: PMI nonpalpable. Regular rate & rhythm. No rubs, gallops or murmurs. Lungs: occasional rhonchi.  Abdomen: Soft, nontender, nondistended. No hepatosplenomegaly. No bruits or masses. Good bowel sounds. Extremities: No cyanosis, clubbing, rash. 2+ edema to knees.  Neuro: Alert & orientedx3, cranial nerves grossly intact. moves all 4 extremities w/o difficulty. Affect pleasant   Telemetry   NSR in 70s, PVCs (personally reviewed)  Labs    CBC Recent Labs    06/08/19 2339 06/10/19 0543  WBC 4.8 6.5  NEUTROABS 3.5 4.8  HGB 10.5* 9.4*  HCT 35.1* 32.4*  MCV 86.7 89.0  PLT 393 993   Basic Metabolic Panel Recent Labs    06/09/19 0440 06/10/19 0543  NA 138 138  K 3.7 3.5  CL 88* 89*  CO2 36* 36*  GLUCOSE 191* 134*  BUN 60* 65*  CREATININE 2.00* 2.35*  CALCIUM 8.6* 8.6*   Liver Function  Tests No results for input(s): AST, ALT, ALKPHOS, BILITOT, PROT, ALBUMIN in the last 72 hours. No results for input(s): LIPASE, AMYLASE in the last 72 hours. Cardiac Enzymes No results for input(s): CKTOTAL, CKMB, CKMBINDEX, TROPONINI in the last 72 hours.  BNP: BNP (last 3 results) Recent Labs    03/06/19 1829 04/09/19 1959 06/08/19 0446  BNP 1,020.1* 1,843.4* 2,301.2*    ProBNP (last 3 results) No results for input(s): PROBNP in the last 8760 hours.   D-Dimer No results for input(s): DDIMER in the last 72 hours. Hemoglobin A1C No results for input(s): HGBA1C in the last 72 hours. Fasting Lipid Panel No results for input(s): CHOL, HDL, LDLCALC, TRIG, CHOLHDL, LDLDIRECT in the last 72 hours. Thyroid Function Tests No results for input(s): TSH, T4TOTAL, T3FREE, THYROIDAB in the last 72 hours.  Invalid input(s): FREET3  Other results:   Imaging     No results found.   Medications:     Scheduled Medications: . acetaZOLAMIDE  500 mg Oral BID  . apixaban  5 mg Oral BID  . aspirin EC  81 mg Oral Daily  . furosemide  80 mg Intravenous Q12H  . gabapentin  300 mg Oral BID  . guaiFENesin  600 mg Oral BID  . insulin aspart  0-15 Units Subcutaneous TID WC  .  insulin aspart  0-5 Units Subcutaneous QHS  . insulin aspart protamine- aspart  10 Units Subcutaneous BID WC  . isosorbide-hydrALAZINE  0.5 tablet Oral TID  . metolazone  5 mg Oral Daily  . mometasone-formoterol  2 puff Inhalation BID  . oxybutynin  5 mg Oral QID  . potassium chloride  40 mEq Oral BID  . potassium chloride  20 mEq Oral Once  . rosuvastatin  20 mg Oral q1800  . sodium chloride flush  3 mL Intravenous Q12H  . tamsulosin  0.4 mg Oral QPC supper     Infusions: . sodium chloride       PRN Medications:  sodium chloride, acetaminophen, albuterol, hydrocortisone, oxyCODONE-acetaminophen **AND** oxyCODONE, polyethylene glycol, sodium chloride flush   Assessment/Plan   1. Acute on chronic  systolic CHF: Nonischemic cardiomyopathy.With prominent RV failure, likely related to COPD/OSA/OHS. TEE in 3/20 with EF 40-45%, severely dilated RV with moderately decreased systolic function. He has had multiple recent admissions with CHF and COPD exacerbations.He has not cooperated with paramedicine or [facility] home health as outpatient, has not been using CPAP per his wife, suspect significant medication noncompliance as well though he denies. Inotropic support for RV has not seemed helpful in the past. He has been readmitted with volume overload.  Creatinine up mildly to 2.35.  - Continue Lasix 80 mg IV bid + metolazone 5 daily + acetazolamide 500 mg bid today and reassess tomorrow.  I told him that he was going to have to let us weigh him, and nurses are going to work on getting accurate I/Os. - Hold off on bisoprolol with CHF exacerbation and soft BP.   - Continue Bidil 0.5 tab tid for now.  - He will stay off spironolactone given history of hyperkalemia.  - I think his long-term prognosis, unfortunately, will be poor with significant RV dysfunction and history of poor compliance.  2. Anemia: Hgb 9.4.  At a prior admission, had GI workup. Colonoscopy 5/8 unremarkable but EGD showed necrotic mucosa gastric fundus/body that is concerning for gastric cancer, biopsies taken which were negative for malignancy.  3. Atrial fibrillation/flutter: Patient has history of paroxysmal atrial fibrillation. Now in NSR with PACs. Was seen in past by Dr. Rayann Heman, not thought to be good ablation candidate. He has not been on Tikosyn or amiodarone due to history of long QT.   - Continue Eliquis.  4. COPD: Severe, on 3L home oxygen.  - Per primary team  5. OHS/OSA: On home oxygen. Per wife, has not been using CPAP at home.  6. Chronic kidney disease stage III: Creatinine has been higher recently, around 2.  Mild increase today, will reassess in am without changing meds for now.  7. DM2 with poor control -  TRH managing  8. CVA:  In 5/20, MRI head showed both small acute/subacute and old CVAs. Suspect related to atrial fibrillation. - He is on Eliquis.   Length of Stay: 1  Loralie Champagne, MD  06/10/2019, 12:51 PM  Advanced Heart Failure Team Pager [phone] (M-F; 7a - 4p)  Please contact [facility] for night-coverage after hours (4p -7a ) and weekends on [email or web address]

## 2019-06-10 NOTE — Progress Notes (Signed)
PROGRESS NOTE  Nicholas Caldwell ZOX:[id number] DOB: 11-17-1945 DOA: 06/08/2019 PCP: [facility]  Brief History   Nicholas Caldwell is a 73 y.o. male with medical history significant of afib; chronic systolic CHF; HTN; HLD; DM: COPD on 3L home O2; CAD; and chronic pain presenting with SOB.  The patient was somnolent at the time of my evaluation (wife reports that he stays up late every night and sleeps until noon daily) and so was unable/unwilling to answer questions.  He was somnolent but in NAD.  HPI per Dr. Dina Rich: Patient reports 3-day history of worsening shortness of breath. He reports orthopnea. He has had occasional chest pain but no chest pain right now. He at baseline is on 3 L of nasal cannula while at home. He denies any recent changes in medication and reports compliance with medication. He states he has had difficulty urinating and has not urinated in over 24 hours. He also reports 4 to 5-day history of no bowel movement. Denies any abdominal pain. Denies fever or cough. He has been using his inhaler at home with minimal relief. He has noted lower extremity swelling and weight gain.  The patient was admitted to a telemetry bed. Cardiology was consulted. The patient is being diuresed.   Consultants  . Cardiology . Heart Failure Team  Procedures  . None  Antibiotics   Anti-infectives (From admission, onward)   None     .   Subjective  The patient is resting comfortably. No new complaints.  Objective   Vitals:  Vitals:   06/10/19 0729 06/10/19 1214  BP:  (!) 110/59  Pulse:  67  Resp:  18  Temp:  (!) 97.3 F (36.3 C)  SpO2: 92% 92%    Exam:  Constitutional:  The patient is awake, alert, and oriented x 3. No acute distress. Respiratory:  . No increased work of breathing. . No wheezes or rhonchi . Positive for diffuse rales. . No tactile fremitus. Cardiovascular:  . Regular rate and rhythm. . No murmurs, ectopy, or gallups . No lateral PMI. No thrills.  Abdomen:  . Abdomen is soft, non-tender, non-distended. . No hernias, masses, or organomegaly are appreciated. . Normoactive bowel sounds.  Musculoskeletal:  . No cyanosis or clubbing . Positive for 2-3+ pitting edema Skin:  . No rashes, lesions, ulcers . palpation of skin: no induration or nodules Neurologic:  . CN 2-12 intact . Sensation all 4 extremities intact Psychiatric:  . Mental status o Mood, affect appropriate o Orientation to person, place, time  . judgment and insight appear intact     I have personally reviewed the following:   Today's Data  . Vitals . CBC, BMP  Scheduled Meds: . acetaZOLAMIDE  500 mg Oral BID  . apixaban  5 mg Oral BID  . aspirin EC  81 mg Oral Daily  . furosemide  80 mg Intravenous Q12H  . gabapentin  300 mg Oral BID  . guaiFENesin  600 mg Oral BID  . insulin aspart  0-15 Units Subcutaneous TID WC  . insulin aspart  0-5 Units Subcutaneous QHS  . insulin aspart protamine- aspart  10 Units Subcutaneous BID WC  . isosorbide-hydrALAZINE  0.5 tablet Oral TID  . metolazone  5 mg Oral Daily  . mometasone-formoterol  2 puff Inhalation BID  . oxybutynin  5 mg Oral QID  . potassium chloride  40 mEq Oral BID  . rosuvastatin  20 mg Oral q1800  . sodium chloride flush  3 mL Intravenous Q12H  .  tamsulosin  0.4 mg Oral QPC supper   Continuous Infusions: . sodium chloride      Principal Problem:   Acute on chronic systolic (congestive) heart failure (HCC) Active Problems:   Essential hypertension   Hyperlipidemia    Prolonged QT interval   COPD (chronic obstructive pulmonary disease) (HCC)   OSA (obstructive sleep apnea)   AKI (acute kidney injury) ([place])   Diabetes mellitus type 2 in obese ([place])   Acute on chronic heart failure (HCC)   LOS: 1 day   A & P  Acute on chronic CHF exacerbation: Echocardiogram from 01/2019 demonstrated dilated right sided ventricle and LVEF of 40%. CXR demonstrates pulmonary edema. Cardiology has been  consulted. Zaroxylyn has been held and acetazolamide has been started. Beta blockers have been held due to acutely worsened CHF. Troponins have been flat. EKG demonstrate no ischemia.  Acute on chronic respiratory failure: The patient continues to require 4L to saturate in the low to mid nineties. Continue to wean as possible.  AKI on stage 3 CKD: Likely associated with decreased renal perfusion in the setting of CHF exacerbation. Creatinine is slightly improved to 2.35 with diuresis.  Prolonged QT: Normal K+, but QTc is persistently elevated on EKG this morning. Avoid agents which could further prolong QT. Monitor on telemetry.  OSA: Continue CPAP qhs  COPD: Stable. The patient has been continued on dulera. Albuterol on an as needed basis.  Afib: Rate is controlled with bisoprolol. Continue Eliquis.  HTN: Continue bisoprolol and bidil.  DM: Continue 70/30. Will cover with moderate-scale SSI for now. Blood sugars have been 119 - 200.  I have seen and examined this patient myself. I have spent 32 minutes in his evaluation and care.  DVT prophylaxis: Eliquis  Code Status:  Full - confirmed with patient Family Communication: None present Disposition Plan:  Home once clinically improved  Seydina Holliman, DO [facility] Direct contact: see [email or web address]  7PM-7AM contact night coverage as above 06/10/2019, 3:32 PM  LOS: 0 days

## 2019-06-10 NOTE — Progress Notes (Signed)
Asked patient if he was ready to go on CPAP, he stated that he was not.  I informed patient that when he was ready to let RN know to call me.  No distress noted at this time.

## 2019-06-11 LAB — BASIC METABOLIC PANEL
Anion gap: 12 (ref 5–15)
BUN: 61 mg/dL — ABNORMAL HIGH (ref 8–23)
CO2: 38 mmol/L — ABNORMAL HIGH (ref 22–32)
Calcium: 8.3 mg/dL — ABNORMAL LOW (ref 8.9–10.3)
Chloride: 88 mmol/L — ABNORMAL LOW (ref 98–111)
Creatinine, Ser: 2.25 mg/dL — ABNORMAL HIGH (ref 0.61–1.24)
GFR calc Af Amer: 33 mL/min — ABNORMAL LOW (ref >60)
GFR calc non Af Amer: 28 mL/min — ABNORMAL LOW (ref >60)
Glucose, Bld: 154 mg/dL — ABNORMAL HIGH (ref 70–99)
Potassium: 3.6 mmol/L (ref 3.5–5.1)
Sodium: 138 mmol/L (ref 135–145)

## 2019-06-11 LAB — CBC
HCT: 33.7 % — ABNORMAL LOW (ref 39.0–52.0)
Hemoglobin: 9.5 g/dL — ABNORMAL LOW (ref 13.0–17.0)
MCH: 25.6 pg — ABNORMAL LOW (ref 26.0–34.0)
MCHC: 28.2 g/dL — ABNORMAL LOW (ref 30.0–36.0)
MCV: 90.8 fL (ref 80.0–100.0)
Platelets: 364 10*3/uL (ref 150–400)
RBC: 3.71 MIL/uL — ABNORMAL LOW (ref 4.22–5.81)
RDW: 18.3 % — ABNORMAL HIGH (ref 11.5–15.5)
WBC: 6.2 10*3/uL (ref 4.0–10.5)
nRBC: 0.3 % — ABNORMAL HIGH (ref 0.0–0.2)

## 2019-06-11 LAB — GLUCOSE, CAPILLARY
Glucose-Capillary: 184 mg/dL — ABNORMAL HIGH (ref 70–99)
Glucose-Capillary: 99 mg/dL (ref 70–99)
Glucose-Capillary: 138 mg/dL — ABNORMAL HIGH (ref 70–99)
Glucose-Capillary: 155 mg/dL — ABNORMAL HIGH (ref 70–99)

## 2019-06-11 MED ORDER — METOLAZONE 2.5 MG PO TABS
2.5000 mg | ORAL_TABLET | Freq: Every day | ORAL | Status: DC
  Administered 2019-06-12 – 2019-06-13 (×2): 2.5 mg via ORAL
  Filled 2019-06-11 (×2): qty 1

## 2019-06-11 MED ORDER — TORSEMIDE 20 MG PO TABS
80.0000 mg | ORAL_TABLET | Freq: Two times a day (BID) | ORAL | Status: DC
  Administered 2019-06-11 – 2019-06-13 (×4): 80 mg via ORAL
  Filled 2019-06-11 (×4): qty 4

## 2019-06-11 MED ORDER — ALUM & MAG HYDROXIDE-SIMETH 200-200-20 MG/5ML PO SUSP
30.0000 mL | ORAL | Status: DC | PRN
  Administered 2019-06-11: 30 mL via ORAL
  Filled 2019-06-11: qty 30

## 2019-06-11 MED ORDER — FLUTICASONE PROPIONATE 50 MCG/ACT NA SUSP
2.0000 | Freq: Every day | NASAL | Status: DC
  Administered 2019-06-11 – 2019-06-13 (×3): 2 via NASAL
  Filled 2019-06-11: qty 16

## 2019-06-11 NOTE — Progress Notes (Signed)
Patient ID: Nicholas Caldwell, male   DOB: [birth year], 73 y.o.   MRN: [id number]     Advanced Heart Failure Rounding Note  PCP-Cardiologist: No primary care provider on file.   Subjective:    Remains on IV lasix, metolazone and diamox. Weight stable at 264. He has 3 drinks next to him and an empty box of nutter butter cookies.   Creatinine 2.35 -> 2.25  Feels bloated. Denies SOB, orthopnea or PND.   Objective:   Weight Range: 119.7 kg Body mass index is 35.8 kg/m.   Vital Signs:   Temp:  [97.3 F (36.3 C)-98.8 F (37.1 C)] 98.8 F (37.1 C) (08/08 0414) Pulse Rate:  [44-68] 44 (08/08 0414) Resp:  [18] 18 (08/08 0414) BP: (103-119)/(54-71) 103/54 (08/08 0414) SpO2:  [91 %-99 %] 96 % (08/08 0932)    Weight change: Filed Weights   06/08/19 0444 06/09/19 0900 06/10/19 0610  Weight: 114.8 kg 119.9 kg 119.7 kg    Intake/Output:   Intake/Output Summary (Last 24 hours) at 06/11/2019 1044 Last data filed at 06/11/2019 2395 Gross per 24 hour  Intake 293 ml  Output 2000 ml  Net -1707 ml      Physical Exam    General:  Obese male sitting in chair No resp difficulty HEENT: normal Neck: supple. JVP to jaw. Carotids 2+ bilat; no bruits. No lymphadenopathy or thryomegaly appreciated. Cor: PMI nondisplaced. Regular rate & rhythm. No rubs, gallops or murmurs. Lungs: clear Abdomen: obese soft, nontender, nondistended. No hepatosplenomegaly. No bruits or masses. Good bowel sounds. Extremities: no cyanosis, clubbing, rash, trace edema Neuro: alert & orientedx3, cranial nerves grossly intact. moves all 4 extremities w/o difficulty. Affect pleasant   Telemetry   NSR in 70-80s,+ PVCs (personally reviewed)  Labs    CBC Recent Labs    06/08/19 2339 06/10/19 0543 06/11/19 0610  WBC 4.8 6.5 6.2  NEUTROABS 3.5 4.8  --   HGB 10.5* 9.4* 9.5*  HCT 35.1* 32.4* 33.7*  MCV 86.7 89.0 90.8  PLT 393 374 320   Basic Metabolic Panel Recent Labs    06/10/19 0543 06/11/19 0610  NA 138  138  K 3.5 3.6  CL 89* 88*  CO2 36* 38*  GLUCOSE 134* 154*  BUN 65* 61*  CREATININE 2.35* 2.25*  CALCIUM 8.6* 8.3*   Liver Function Tests No results for input(s): AST, ALT, ALKPHOS, BILITOT, PROT, ALBUMIN in the last 72 hours. No results for input(s): LIPASE, AMYLASE in the last 72 hours. Cardiac Enzymes No results for input(s): CKTOTAL, CKMB, CKMBINDEX, TROPONINI in the last 72 hours.  BNP: BNP (last 3 results) Recent Labs    03/06/19 1829 04/09/19 1959 06/08/19 0446  BNP 1,020.1* 1,843.4* 2,301.2*    ProBNP (last 3 results) No results for input(s): PROBNP in the last 8760 hours.   D-Dimer No results for input(s): DDIMER in the last 72 hours. Hemoglobin A1C No results for input(s): HGBA1C in the last 72 hours. Fasting Lipid Panel No results for input(s): CHOL, HDL, LDLCALC, TRIG, CHOLHDL, LDLDIRECT in the last 72 hours. Thyroid Function Tests No results for input(s): TSH, T4TOTAL, T3FREE, THYROIDAB in the last 72 hours.  Invalid input(s): FREET3  Other results:   Imaging    No results found.   Medications:     Scheduled Medications: . acetaZOLAMIDE  500 mg Oral BID  . apixaban  5 mg Oral BID  . aspirin EC  81 mg Oral Daily  . furosemide  80 mg Intravenous Q12H  . gabapentin  300 mg Oral BID  . guaiFENesin  600 mg Oral BID  . insulin aspart  0-15 Units Subcutaneous TID WC  . insulin aspart  0-5 Units Subcutaneous QHS  . insulin aspart protamine- aspart  10 Units Subcutaneous BID WC  . isosorbide-hydrALAZINE  0.5 tablet Oral TID  . metolazone  5 mg Oral Daily  . mometasone-formoterol  2 puff Inhalation BID  . oxybutynin  5 mg Oral QID  . potassium chloride  40 mEq Oral BID  . rosuvastatin  20 mg Oral q1800  . sodium chloride flush  3 mL Intravenous Q12H  . tamsulosin  0.4 mg Oral QPC supper    Infusions: . sodium chloride      PRN Medications: sodium chloride, acetaminophen, albuterol, hydrocortisone, oxyCODONE-acetaminophen **AND**  oxyCODONE, polyethylene glycol, sodium chloride flush   Assessment/Plan   1. Acute on chronic systolic CHF: Nonischemic cardiomyopathy.With prominent RV failure, likely related to COPD/OSA/OHS. TEE in 3/20 with EF 40-45%, severely dilated RV with moderately decreased systolic function. He has had multiple recent admissions with CHF and COPD exacerbations.He has not cooperated with paramedicine or [facility] home health as outpatient, has not been using CPAP per his wife, suspect significant medication noncompliance as well though he denies. Inotropic support for RV has not seemed helpful in the past. He has been readmitted with volume overload.  Creatinine down a bit today 2.35 -> 2.25.  - Weight stale at 264 today and this is the lowest it has been in quite some time. I doubt we will get him much lower particularly in light of his ongoing high fluid and slat intake - Will switch back to home torsemide of 80 bid with 2.5 metolazone daily.   - Hold off on bisoprolol with CHF exacerbation and soft BP.   - Continue Bidil 0.5 tab tid for now.  - He will stay off spironolactone given history of hyperkalemia.  - I think his long-term prognosis, unfortunately, will be poor with significant RV dysfunction and history of poor compliance.  2. Anemia: Hgb stable at 9.5.  At a prior admission, had GI workup. Colonoscopy 5/8 unremarkable but EGD showed necrotic mucosa gastric fundus/body that is concerning for gastric cancer, biopsies taken which were negative for malignancy.  3. Atrial fibrillation/flutter: Patient has history of paroxysmal atrial fibrillation. Now in NSR with PAC/PVCss. Was seen in past by Dr. Rayann Heman, not thought to be good ablation candidate. He has not been on Tikosyn or amiodarone due to history of long QT.   - Continue Eliquis.  4. COPD: Severe, on 3L home oxygen.  - Per primary team  5. OHS/OSA: On home oxygen. Per wife, has not been using CPAP at home.  6. Chronic kidney  disease stage III: Creatinine has been higher recently, around 2.  Overall stable to slightly improved today.  7. DM2 with poor control - TRH managing  8. CVA:  In 5/20, MRI head showed both small acute/subacute and old CVAs. Suspect related to atrial fibrillation. - He is on Eliquis.   Length of Stay: 2  Glori Bickers, MD  06/11/2019, 10:44 AM  Advanced Heart Failure Team Pager [phone] (M-F; 7a - 4p)  Please contact [facility] for night-coverage after hours (4p -7a ) and weekends on [email or web address]

## 2019-06-11 NOTE — Progress Notes (Signed)
Orthopedic Tech Progress Note Patient Details:  Nicholas Caldwell 02-25-1946 [id number]  Ortho Devices Type of Ortho Device: Louretta Parma boot Ortho Device/Splint Location: Bilateral unna boots Ortho Device/Splint Interventions: Application   Post Interventions Patient Tolerated: Well Instructions Provided: Care of device   [name] 06/11/2019, 6:31 PM

## 2019-06-11 NOTE — Progress Notes (Signed)
PROGRESS NOTE  Nicholas Caldwell KDT:[id number] DOB: 1946/10/16 DOA: 06/08/2019 PCP: [facility]  Brief History   Nicholas Caldwell is a 73 y.o. male with medical history significant of afib; chronic systolic CHF; HTN; HLD; DM: COPD on 3L home O2; CAD; and chronic pain presenting with SOB.  The patient was somnolent at the time of my evaluation (wife reports that he stays up late every night and sleeps until noon daily) and so was unable/unwilling to answer questions.  He was somnolent but in NAD.  HPI per Dr. Dina Rich: Patient reports 3-day history of worsening shortness of breath. He reports orthopnea. He has had occasional chest pain but no chest pain right now. He at baseline is on 3 L of nasal cannula while at home. He denies any recent changes in medication and reports compliance with medication. He states he has had difficulty urinating and has not urinated in over 24 hours. He also reports 4 to 5-day history of no bowel movement. Denies any abdominal pain. Denies fever or cough. He has been using his inhaler at home with minimal relief. He has noted lower extremity swelling and weight gain.  The patient was admitted to a telemetry bed. Cardiology was consulted. The patient is being diuresed. He has been evaluated by PT and they have recommended that the patient discharge to home with family support. However, the patient was evaluated by nursing today, and he required 2x assist with standing and only was able to walk a few feet with assistance. Attempts to reach the patient's wife to discuss discharge plans were unsuccessful.  Consultants  . Cardiology . Heart Failure Team  Procedures  . None  Antibiotics   Anti-infectives (From admission, onward)   None      Subjective  The patient is resting comfortably. No new complaints.  Objective   Vitals:  Vitals:   06/11/19 0414 06/11/19 0932  BP: (!) 103/54   Pulse: (!) 44   Resp: 18   Temp: 98.8 F (37.1 C)   SpO2: 97% 96%     Exam:  Constitutional:  The patient is awake, alert, and oriented x 3. No acute distress. Respiratory:  . No increased work of breathing. . No wheezes or rhonchi . Positive for diffuse rales. . No tactile fremitus. Cardiovascular:  . Regular rate and rhythm. . No murmurs, ectopy, or gallups . No lateral PMI. No thrills. Abdomen:  . Abdomen is soft, non-tender, non-distended. . No hernias, masses, or organomegaly are appreciated. . Normoactive bowel sounds.  Musculoskeletal:  . No cyanosis or clubbing . Positive for 2-3+ pitting edema Skin:  . No rashes, lesions, ulcers . palpation of skin: no induration or nodules Neurologic:  . CN 2-12 intact . Sensation all 4 extremities intact Psychiatric:  . Mental status o Mood, affect appropriate o Orientation to person, place, time  . judgment and insight appear intact     I have personally reviewed the following:   Today's Data  . Vitals . CBC, BMP  Scheduled Meds: . apixaban  5 mg Oral BID  . aspirin EC  81 mg Oral Daily  . gabapentin  300 mg Oral BID  . guaiFENesin  600 mg Oral BID  . insulin aspart  0-15 Units Subcutaneous TID WC  . insulin aspart  0-5 Units Subcutaneous QHS  . insulin aspart protamine- aspart  10 Units Subcutaneous BID WC  . isosorbide-hydrALAZINE  0.5 tablet Oral TID  . [START ON 06/12/2019] metolazone  2.5 mg Oral Daily  .  mometasone-formoterol  2 puff Inhalation BID  . oxybutynin  5 mg Oral QID  . potassium chloride  40 mEq Oral BID  . rosuvastatin  20 mg Oral q1800  . sodium chloride flush  3 mL Intravenous Q12H  . tamsulosin  0.4 mg Oral QPC supper  . torsemide  80 mg Oral BID   Continuous Infusions: . sodium chloride      Principal Problem:   Acute on chronic systolic (congestive) heart failure (HCC) Active Problems:   Essential hypertension   Hyperlipidemia    Prolonged QT interval   COPD (chronic obstructive pulmonary disease) (HCC)   OSA (obstructive sleep apnea)   AKI (acute  kidney injury) ([place])   Diabetes mellitus type 2 in obese ([place])   Acute on chronic heart failure ([place]) Debility   LOS: 2 days   A & P  Acute on chronic CHF exacerbation: Echocardiogram from 01/2019 demonstrated dilated right sided ventricle and LVEF of 40%. CXR demonstrates pulmonary edema. Cardiology has been consulted. Zaroxylyn has been held and acetazolamide has been started. Beta blockers have been held due to acutely worsened CHF. Troponins have been flat. EKG demonstrate no ischemia.  Acute on chronic respiratory failure: The patient continues to require 4L to saturate in the low to mid nineties. Continue to wean as possible.  AKI on stage 3 CKD: Likely associated with decreased renal perfusion in the setting of CHF exacerbation. Creatinine is slightly improved to 2.35 with diuresis.  Prolonged QT: Normal K+, but QTc is persistently elevated on EKG this morning. Avoid agents which could further prolong QT. Monitor on telemetry.  OSA: Continue CPAP qhs  COPD: Stable. The patient has been continued on dulera. Albuterol on an as needed basis.  Afib: Rate is controlled with bisoprolol. Continue Eliquis.  HTN: Continue bisoprolol and bidil.  DM: Continue 70/30. Will cover with moderate-scale SSI for now. Blood sugars have been 119 - 200.  Debility: The patient was evaluated by PT on 06/08/2019. They recommended discharge to home with family support. Today nursing attempted to get the patient up to walk in the halls. He required a great deal of assistance just to stand. I have attempted to reach the patient's wife about her ability to assist him at home. I have also asked PT to re-evaluate him.  I have seen and examined this patient myself. I have spent 35 minutes in his evaluation and care.  DVT prophylaxis: Eliquis  Code Status:  Full - confirmed with patient Family Communication: None present Disposition Plan:  Home once clinically improved  Gelene Recktenwald, DO [facility] Direct contact: see [email or web address]  7PM-7AM contact night coverage as above 06/11/2019, 3:45 PM  LOS: 0 days

## 2019-06-11 NOTE — Progress Notes (Signed)
   Vital Signs MEWS/VS Documentation      06/10/2019 2038 06/11/2019 0414 06/11/2019 0700 06/11/2019 0932   MEWS Score:  0  1  0  -   MEWS Score Color:  Green  Green  Green  -   Resp:  -  18  -  -   Pulse:  -  (!) 44  -  -   BP:  -  (!) 103/54  -  -   Temp:  -  98.8 F (37.1 C)  -  -   O2 Device:  -  Nasal Cannula  -  Nasal Cannula   O2 Flow Rate (L/min):  -  -  -  4 L/min   Level of Consciousness:  Alert  -  -  Maud Deed Tobias-Diakun 06/11/2019,2:09 PM

## 2019-06-12 LAB — BASIC METABOLIC PANEL
Anion gap: 17 — ABNORMAL HIGH (ref 5–15)
Anion gap: 12 (ref 5–15)
BUN: 60 mg/dL — ABNORMAL HIGH (ref 8–23)
BUN: 59 mg/dL — ABNORMAL HIGH (ref 8–23)
CO2: 35 mmol/L — ABNORMAL HIGH (ref 22–32)
CO2: 40 mmol/L — ABNORMAL HIGH (ref 22–32)
Calcium: 8.5 mg/dL — ABNORMAL LOW (ref 8.9–10.3)
Calcium: 8.6 mg/dL — ABNORMAL LOW (ref 8.9–10.3)
Chloride: 85 mmol/L — ABNORMAL LOW (ref 98–111)
Chloride: 85 mmol/L — ABNORMAL LOW (ref 98–111)
Creatinine, Ser: 2.27 mg/dL — ABNORMAL HIGH (ref 0.61–1.24)
Creatinine, Ser: 2.33 mg/dL — ABNORMAL HIGH (ref 0.61–1.24)
GFR calc Af Amer: 32 mL/min — ABNORMAL LOW (ref >60)
GFR calc Af Amer: 31 mL/min — ABNORMAL LOW (ref >60)
GFR calc non Af Amer: 28 mL/min — ABNORMAL LOW (ref >60)
GFR calc non Af Amer: 27 mL/min — ABNORMAL LOW (ref >60)
Glucose, Bld: 165 mg/dL — ABNORMAL HIGH (ref 70–99)
Glucose, Bld: 130 mg/dL — ABNORMAL HIGH (ref 70–99)
Potassium: 2.9 mmol/L — ABNORMAL LOW (ref 3.5–5.1)
Potassium: 2.9 mmol/L — ABNORMAL LOW (ref 3.5–5.1)
Sodium: 137 mmol/L (ref 135–145)
Sodium: 137 mmol/L (ref 135–145)

## 2019-06-12 LAB — GLUCOSE, CAPILLARY
Glucose-Capillary: 146 mg/dL — ABNORMAL HIGH (ref 70–99)
Glucose-Capillary: 174 mg/dL — ABNORMAL HIGH (ref 70–99)
Glucose-Capillary: 161 mg/dL — ABNORMAL HIGH (ref 70–99)
Glucose-Capillary: 104 mg/dL — ABNORMAL HIGH (ref 70–99)

## 2019-06-12 NOTE — Progress Notes (Signed)
PROGRESS NOTE  Jedaiah Rathbun GDJ:[id number] DOB: 01-Nov-1946 DOA: 06/08/2019 PCP: [facility]  Brief History   Tylen Leverich is a 73 y.o. male with medical history significant of afib; chronic systolic CHF; HTN; HLD; DM: COPD on 3L home O2; CAD; and chronic pain presenting with SOB.  The patient was somnolent at the time of my evaluation (wife reports that he stays up late every night and sleeps until noon daily) and so was unable/unwilling to answer questions.  He was somnolent but in NAD.  HPI per Dr. Dina Rich: Patient reports 3-day history of worsening shortness of breath. He reports orthopnea. He has had occasional chest pain but no chest pain right now. He at baseline is on 3 L of nasal cannula while at home. He denies any recent changes in medication and reports compliance with medication. He states he has had difficulty urinating and has not urinated in over 24 hours. He also reports 4 to 5-day history of no bowel movement. Denies any abdominal pain. Denies fever or cough. He has been using his inhaler at home with minimal relief. He has noted lower extremity swelling and weight gain.  The patient was admitted to a telemetry bed. Cardiology was consulted. The patient is being diuresed. He has been evaluated by PT and they have recommended that the patient discharge to home with family support. However, the patient was evaluated by nursing today, and he required 2x assist with standing and only was able to walk a few feet with assistance. Attempts to reach the patient's wife to discuss discharge plans were unsuccessful.  Consultants  . Cardiology . Heart Failure Team  Procedures  . None  Antibiotics   Anti-infectives (From admission, onward)   None      Subjective  The patient is resting comfortably. No new complaints.  Objective   Vitals:  Vitals:   06/11/19 2124 06/12/19 0517  BP:  (!) 108/52  Pulse: 70 80  Resp: 20 18  Temp:  99.2 F (37.3 C)  SpO2: 92% 95%     Exam:  Constitutional:  The patient is awake, alert, and oriented x 3. No acute distress. Respiratory:  . No increased work of breathing. . No wheezes or rhonchi . Positive for diffuse rales. . No tactile fremitus. Cardiovascular:  . Regular rate and rhythm. . No murmurs, ectopy, or gallups . No lateral PMI. No thrills. Abdomen:  . Abdomen is soft, non-tender, non-distended. . No hernias, masses, or organomegaly are appreciated. . Normoactive bowel sounds.  Musculoskeletal:  . No cyanosis or clubbing . Positive for 2-3+ pitting edema Skin:  . No rashes, lesions, ulcers . palpation of skin: no induration or nodules Neurologic:  . CN 2-12 intact . Sensation all 4 extremities intact Psychiatric:  . Mental status o Mood, affect appropriate o Orientation to person, place, time  . judgment and insight appear intact     I have personally reviewed the following:   Today's Data  . Vitals . CBC, BMP  Scheduled Meds: . apixaban  5 mg Oral BID  . aspirin EC  81 mg Oral Daily  . fluticasone  2 spray Each Nare Daily  . gabapentin  300 mg Oral BID  . guaiFENesin  600 mg Oral BID  . insulin aspart  0-15 Units Subcutaneous TID WC  . insulin aspart  0-5 Units Subcutaneous QHS  . insulin aspart protamine- aspart  10 Units Subcutaneous BID WC  . isosorbide-hydrALAZINE  0.5 tablet Oral TID  . metolazone  2.5  mg Oral Daily  . mometasone-formoterol  2 puff Inhalation BID  . oxybutynin  5 mg Oral QID  . potassium chloride  40 mEq Oral BID  . rosuvastatin  20 mg Oral q1800  . sodium chloride flush  3 mL Intravenous Q12H  . tamsulosin  0.4 mg Oral QPC supper  . torsemide  80 mg Oral BID   Continuous Infusions: . sodium chloride      Principal Problem:   Acute on chronic systolic (congestive) heart failure (HCC) Active Problems:   Essential hypertension   Hyperlipidemia    Prolonged QT interval   COPD (chronic obstructive pulmonary disease) (HCC)   OSA (obstructive sleep  apnea)   AKI (acute kidney injury) ([place])   Diabetes mellitus type 2 in obese ([place])   Acute on chronic heart failure ([place]) Debility   LOS: 3 days   A & P  Acute on chronic CHF exacerbation: Echocardiogram from 01/2019 demonstrated dilated right sided ventricle and LVEF of 40%. CXR demonstrates pulmonary edema. Cardiology has been consulted. Zaroxylyn has been held and acetazolamide has been started. Beta blockers have been held due to acutely worsened CHF. Troponins have been flat. EKG demonstrate no ischemia. Legs wrapped bilaterally.  Acute on chronic respiratory failure: The patient continues to require 4L to saturate in the low to mid nineties. Continue to wean as possible.  AKI on stage 3 CKD: Likely associated with decreased renal perfusion in the setting of CHF exacerbation. Creatinine is slightly improved to 2.27 with diuresis.  Hypokalemia: Supplement and monitor.  Prolonged QT: Normal K+, but QTc is persistently elevated on EKG this morning. Avoid agents which could further prolong QT. Monitor on telemetry.  OSA: Continue CPAP qhs  COPD: Stable. The patient has been continued on dulera. Albuterol on an as needed basis.  Afib: Rate is controlled with bisoprolol. Continue Eliquis.  HTN: Continue bisoprolol and bidil.  DM: Continue 70/30. Will cover with moderate-scale SSI for now. Blood sugars have been 119 - 200.  Debility: The patient was evaluated by PT on 06/08/2019. They recommended discharge to home with family support. Today nursing attempted to get the patient up to walk in the halls. He required a great deal of assistance just to stand. I have attempted to reach the patient's wife about her ability to assist him at home. I have also asked PT to re-evaluate him.  I have seen and examined this patient myself. I have spent 35 minutes in his evaluation and care.  DVT prophylaxis: Eliquis  Code Status:  Full - confirmed with patient Family Communication: None  present Disposition Plan:  Home once clinically improved  Latarra Eagleton, DO [facility] Direct contact: see [email or web address]  7PM-7AM contact night coverage as above 06/12/2019, 4:05 PM  LOS: 0 days

## 2019-06-12 NOTE — Progress Notes (Signed)
Patient ID: Nicholas Caldwell, male   DOB: 01-24-1946, 73 y.o.   MRN: [id number]     Advanced Heart Failure Rounding Note  PCP-Cardiologist: No primary care provider on file.   Subjective:    Switched back to po torsemide yesterday. Says he feels good this am.  Denies SOB, orthopnea or PND. Weight down 1 pound.   Objective:   Weight Range: 119.4 kg Body mass index is 35.71 kg/m.   Vital Signs:   Temp:  [98.9 F (37.2 C)-99.2 F (37.3 C)] 99.2 F (37.3 C) (08/09 0517) Pulse Rate:  [70-80] 80 (08/09 0517) Resp:  [18-20] 18 (08/09 0517) BP: (101-108)/(52-61) 108/52 (08/09 0517) SpO2:  [92 %-96 %] 95 % (08/09 0517) Weight:  [119.4 kg] 119.4 kg (08/09 0500) Last BM Date: 06/09/19  Weight change: Filed Weights   06/09/19 0900 06/10/19 0610 06/12/19 0500  Weight: 119.9 kg 119.7 kg 119.4 kg    Intake/Output:   Intake/Output Summary (Last 24 hours) at 06/12/2019 0613 Last data filed at 06/12/2019 0518 Gross per 24 hour  Intake 825 ml  Output 3150 ml  Net -2325 ml      Physical Exam   General:  Lying in bed . No resp difficulty HEENT: normal Neck: supple. no JVP to jaw. Carotids 2+ bilat; no bruits. No lymphadenopathy or thryomegaly appreciated. Cor: PMI nondisplaced. Regular rate & rhythm. No rubs, gallops or murmurs. Lungs: clear Abdomen: obese soft, nontender, nondistended. No hepatosplenomegaly. No bruits or masses. Good bowel sounds. Extremities: no cyanosis, clubbing, rash, tr edema +UNNA boots Neuro: alert & orientedx3, cranial nerves grossly intact. moves all 4 extremities w/o difficulty. Affect pleasant    Telemetry   NSR in 70-80s,+ PVCs (personally reviewed)  Labs    CBC Recent Labs    06/10/19 0543 06/11/19 0610  WBC 6.5 6.2  NEUTROABS 4.8  --   HGB 9.4* 9.5*  HCT 32.4* 33.7*  MCV 89.0 90.8  PLT 374 409   Basic Metabolic Panel Recent Labs    06/10/19 0543 06/11/19 0610  NA 138 138  K 3.5 3.6  CL 89* 88*  CO2 36* 38*  GLUCOSE 134* 154*   BUN 65* 61*  CREATININE 2.35* 2.25*  CALCIUM 8.6* 8.3*   Liver Function Tests No results for input(s): AST, ALT, ALKPHOS, BILITOT, PROT, ALBUMIN in the last 72 hours. No results for input(s): LIPASE, AMYLASE in the last 72 hours. Cardiac Enzymes No results for input(s): CKTOTAL, CKMB, CKMBINDEX, TROPONINI in the last 72 hours.  BNP: BNP (last 3 results) Recent Labs    03/06/19 1829 04/09/19 1959 06/08/19 0446  BNP 1,020.1* 1,843.4* 2,301.2*    ProBNP (last 3 results) No results for input(s): PROBNP in the last 8760 hours.   D-Dimer No results for input(s): DDIMER in the last 72 hours. Hemoglobin A1C No results for input(s): HGBA1C in the last 72 hours. Fasting Lipid Panel No results for input(s): CHOL, HDL, LDLCALC, TRIG, CHOLHDL, LDLDIRECT in the last 72 hours. Thyroid Function Tests No results for input(s): TSH, T4TOTAL, T3FREE, THYROIDAB in the last 72 hours.  Invalid input(s): FREET3  Other results:   Imaging    No results found.   Medications:     Scheduled Medications: . apixaban  5 mg Oral BID  . aspirin EC  81 mg Oral Daily  . fluticasone  2 spray Each Nare Daily  . gabapentin  300 mg Oral BID  . guaiFENesin  600 mg Oral BID  . insulin aspart  0-15 Units Subcutaneous TID  WC  . insulin aspart  0-5 Units Subcutaneous QHS  . insulin aspart protamine- aspart  10 Units Subcutaneous BID WC  . isosorbide-hydrALAZINE  0.5 tablet Oral TID  . metolazone  2.5 mg Oral Daily  . mometasone-formoterol  2 puff Inhalation BID  . oxybutynin  5 mg Oral QID  . potassium chloride  40 mEq Oral BID  . rosuvastatin  20 mg Oral q1800  . sodium chloride flush  3 mL Intravenous Q12H  . tamsulosin  0.4 mg Oral QPC supper  . torsemide  80 mg Oral BID    Infusions: . sodium chloride      PRN Medications: sodium chloride, acetaminophen, albuterol, alum & mag hydroxide-simeth, hydrocortisone, oxyCODONE-acetaminophen **AND** oxyCODONE, polyethylene glycol, sodium  chloride flush   Assessment/Plan   1. Acute on chronic systolic CHF: Nonischemic cardiomyopathy.With prominent RV failure, likely related to COPD/OSA/OHS. TEE in 3/20 with EF 40-45%, severely dilated RV with moderately decreased systolic function. He has had multiple recent admissions with CHF and COPD exacerbations.He has not cooperated with paramedicine or [facility] home health as outpatient, has not been using CPAP per his wife, suspect significant medication noncompliance as well though he denies. Inotropic support for RV has not seemed helpful in the past. He has been readmitted with volume overload.  Creatinine down a bit today 2.35 -> 2.25.  - Weight down to 263 today and this is the lowest it has been in quite some time. I doubt we will get him much lower particularly in light of his ongoing high fluid and salt intake - Continue home torsemide of 80 bid with 2.5 metolazone daily on M-F with Kdur 20 mq extra.   - If creatinine and electrolytes stable can go home today from our standpoint. (resume home meds except bisoprolol and increase metolazone as above) - Hold off on bisoprolol with CHF exacerbation and soft BP.   - Continue Bidil 0.5 tab tid for now.  - He will stay off spironolactone given history of hyperkalemia.  - I think his long-term prognosis, unfortunately, will be poor with significant RV dysfunction and history of poor compliance.  2. Anemia: Hgb stable at 9.5.  At a prior admission, had GI workup. Colonoscopy 5/8 unremarkable but EGD showed necrotic mucosa gastric fundus/body that is concerning for gastric cancer, biopsies taken which were negative for malignancy.  3. Atrial fibrillation/flutter: Patient has history of paroxysmal atrial fibrillation. Now in NSR with PAC/PVCss. Was seen in past by Dr. Rayann Heman, not thought to be good ablation candidate. He has not been on Tikosyn or amiodarone due to history of long QT.   - Continue Eliquis.  4. COPD: Severe, on 3L home  oxygen.  - Per primary team  5. OHS/OSA: On home oxygen. Per wife, has not been using CPAP at home.  6. Chronic kidney disease stage III: Creatinine has been higher recently, around 2.  Overall stable to slightly improved today.  7. DM2 with poor control - TRH managing  8. CVA:  In 5/20, MRI head showed both small acute/subacute and old CVAs. Suspect related to atrial fibrillation. - He is on Eliquis.   Stable from HF standpoint. We will s/o. Can go home today with above med changes from our standpoint.   Length of Stay: 3  Glori Bickers, MD  06/12/2019, 6:13 AM  Advanced Heart Failure Team Pager [phone] (M-F; 7a - 4p)  Please contact [facility] for night-coverage after hours (4p -7a ) and weekends on [email or web address]

## 2019-06-12 NOTE — Progress Notes (Signed)
Physical Therapy Treatment Patient Details Name: Nicholas Caldwell MRN: [id number] DOB: [birth year] Today's Date: 06/12/2019    History of Present Illness 73 yo admitted with SOB, CHF and AKI. PMHx: Afib, CAD, CHF, COPD on hoem O2, DM, HTN, HLD    PT Comments    Pt progressing slowly towards physical therapy goals. Was able to perform transfers and ambulation with up to min assist for balance support and safety. Pt with 2 longer standing rest breaks with forearms on walker and 2 shorter standing rest breaks upright for 75' distance. Pt declining going to rehab for continued therapy but states he is willing to try home health PT. When discussing this, pt reports "It won't help; I'm at the end anyway." Pt became tearful. I attempted to explain his potential for improvement with PT but pt appeared withdrawn. Will continue to follow and progress as able per POC.     Follow Up Recommendations  Home health PT;Supervision for mobility/OOB     Equipment Recommendations  None recommended by PT    Recommendations for Other Services       Precautions / Restrictions Precautions Precautions: Fall Precaution Comments: watch sats Restrictions Weight Bearing Restrictions: No    Mobility  Bed Mobility Overal bed mobility: Needs Assistance Bed Mobility: Supine to Sit     Supine to sit: HOB elevated;Min assist     General bed mobility comments: Increased time and effort to transition fully to EOB. Pt with long pause before being able to push himself up to full sitting position. Min assist at the shoulder to boost fully.   Transfers Overall transfer level: Needs assistance Equipment used: Rolling walker (2 wheeled) Transfers: Sit to/from Stand Sit to Stand: Min assist         General transfer comment: min assist to rise from surface with bed height slightly elevated, cues for hand placement with assist for anterior translation and power up  Ambulation/Gait Ambulation/Gait assistance: Min  guard; Min assist Gait Distance (Feet): 75 Feet Assistive device: Rolling walker (2 wheeled) Gait Pattern/deviations: Step-through pattern;Decreased stride length;Trunk flexed Gait velocity: Decreased Gait velocity interpretation: <1.31 ft/sec, indicative of household ambulator General Gait Details: Slow and unsteady with poor posture. Not making corrective changes to posture with cues, and with 2 standing rest breaks, resting forearms onto walker handles.    Stairs             Wheelchair Mobility    Modified Rankin (Stroke Patients Only)       Balance Overall balance assessment: Needs assistance Sitting-balance support: Feet supported;Bilateral upper extremity supported Sitting balance-Leahy Scale: Fair Sitting balance - Comments: pt able to sit EOB without UE support   Standing balance support: Bilateral upper extremity supported Standing balance-Leahy Scale: Poor Standing balance comment: reliant on bil UE support for standing                            Cognition Arousal/Alertness: Awake/alert Behavior During Therapy: Flat affect Overall Cognitive Status: Impaired/Different from baseline Area of Impairment: Problem solving;Safety/judgement                         Safety/Judgement: Decreased awareness of safety;Decreased awareness of deficits   Problem Solving: Slow processing;Requires verbal cues General Comments: pt very slow to respond to questions and commands with delayed initiation      Exercises      General Comments        Pertinent  Vitals/Pain Pain Assessment: No/denies pain    Home Living                      Prior Function            PT Goals (current goals can now be found in the care plan section) Acute Rehab PT Goals Patient Stated Goal: return home PT Goal Formulation: With patient Time For Goal Achievement: 06/23/19 Potential to Achieve Goals: Fair Progress towards PT goals: Progressing toward  goals    Frequency    Min 3X/week      PT Plan Current plan remains appropriate    Co-evaluation              AM-PAC PT "6 Clicks" Mobility   Outcome Measure  Help needed turning from your back to your side while in a flat bed without using bedrails?: A Little Help needed moving from lying on your back to sitting on the side of a flat bed without using bedrails?: A Little Help needed moving to and from a bed to a chair (including a wheelchair)?: A Little Help needed standing up from a chair using your arms (e.g., wheelchair or bedside chair)?: A Little Help needed to walk in hospital room?: A Little Help needed climbing 3-5 steps with a railing? : A Lot 6 Click Score: 17    End of Session Equipment Utilized During Treatment: Gait belt;Oxygen Activity Tolerance: Patient limited by fatigue Patient left: in chair;with call bell/phone within reach;with chair alarm set;with nursing/sitter in room Nurse Communication: Mobility status PT Visit Diagnosis: Other abnormalities of gait and mobility (R26.89);Muscle weakness (generalized) (M62.81);Difficulty in walking, not elsewhere classified (R26.2)     Time: 7159-5396 PT Time Calculation (min) (ACUTE ONLY): 26 min  Charges:  $Gait Training: 23-37 mins                     Rolinda Roan, PT, DPT Acute Rehabilitation Services Pager: [phone] Office: [place] 06/12/2019, 1:30 PM

## 2019-06-12 NOTE — Progress Notes (Signed)
Pt has refused cpap at this time.  RT informed pt to call RN and request RT if he changes his mind. RT will monitor.

## 2019-06-13 LAB — GLUCOSE, CAPILLARY
Glucose-Capillary: 156 mg/dL — ABNORMAL HIGH (ref 70–99)
Glucose-Capillary: 134 mg/dL — ABNORMAL HIGH (ref 70–99)

## 2019-06-13 LAB — BASIC METABOLIC PANEL
Anion gap: 13 (ref 5–15)
BUN: 62 mg/dL — ABNORMAL HIGH (ref 8–23)
CO2: 41 mmol/L — ABNORMAL HIGH (ref 22–32)
Calcium: 8.6 mg/dL — ABNORMAL LOW (ref 8.9–10.3)
Chloride: 85 mmol/L — ABNORMAL LOW (ref 98–111)
Creatinine, Ser: 2.53 mg/dL — ABNORMAL HIGH (ref 0.61–1.24)
GFR calc Af Amer: 28 mL/min — ABNORMAL LOW (ref >60)
GFR calc non Af Amer: 25 mL/min — ABNORMAL LOW (ref >60)
Glucose, Bld: 161 mg/dL — ABNORMAL HIGH (ref 70–99)
Potassium: 2.7 mmol/L — CL (ref 3.5–5.1)
Sodium: 139 mmol/L (ref 135–145)

## 2019-06-13 MED ORDER — ASPIRIN 81 MG PO TBEC: 81.0000 mg | DELAYED_RELEASE_TABLET | Freq: Every day | ORAL | 0 refills | Status: DC

## 2019-06-13 MED ORDER — OXYBUTYNIN CHLORIDE 5 MG PO TABS: 5.0000 mg | ORAL_TABLET | Freq: Four times a day (QID) | ORAL | 0 refills | Status: DC

## 2019-06-13 MED ORDER — GABAPENTIN 300 MG PO CAPS: 300.0000 mg | ORAL_CAPSULE | Freq: Two times a day (BID) | ORAL | 0 refills | Status: DC

## 2019-06-13 MED ORDER — POTASSIUM CHLORIDE CRYS ER 20 MEQ PO TBCR
40.0000 meq | EXTENDED_RELEASE_TABLET | ORAL | Status: DC
  Administered 2019-06-13: 40 meq via ORAL
  Filled 2019-06-13: qty 2

## 2019-06-13 MED ORDER — POTASSIUM CHLORIDE CRYS ER 20 MEQ PO TBCR: 60.0000 meq | EXTENDED_RELEASE_TABLET | Freq: Two times a day (BID) | ORAL | 3 refills | Status: DC

## 2019-06-13 NOTE — Progress Notes (Signed)
Patient ambulated in the hallway with 3L o2, o2 sat: 84% HR: 111

## 2019-06-13 NOTE — Progress Notes (Signed)
Pt refused ambulating at this time. Will try again alter

## 2019-06-13 NOTE — Progress Notes (Signed)
Physical Therapy Treatment Patient Details Name: Nicholas Caldwell MRN: [id number] DOB: April 22, 1946 Today's Date: 06/13/2019    History of Present Illness 73 yo admitted with SOB, CHF and AKI. PMHx: Afib, CAD, CHF, COPD on hoem O2, DM, HTN, HLD    PT Comments    Patient seen for mobility progression. Pt requires min A for bed mobility and min guard to stand from EOB this session. Pt tolerated gait distance of 70 ft with 3 standing rest breaks using RW with min guard for safety. Pt relies heavily on bilat UE support and ambulatory distance limited due to c/o "arms burning". Pt will continue to benefit from further skilled PT services to maximize independence and safety with mobility.     Follow Up Recommendations  Home health PT;Supervision for mobility/OOB     Equipment Recommendations  None recommended by PT    Recommendations for Other Services       Precautions / Restrictions Precautions Precautions: Fall Precaution Comments: watch sats Restrictions Weight Bearing Restrictions: No    Mobility  Bed Mobility Overal bed mobility: Needs Assistance Bed Mobility: Supine to Sit     Supine to sit: HOB elevated;Min assist     General bed mobility comments: assist to elevate trunk into sitting; use of rail; increased time and effort   Transfers Overall transfer level: Needs assistance Equipment used: Rolling walker (2 wheeled) Transfers: Sit to/from Stand Sit to Stand: Min guard         General transfer comment: min guard for safety from EOB  Ambulation/Gait Ambulation/Gait assistance: Min guard Gait Distance (Feet): 70 Feet Assistive device: Rolling walker (2 wheeled) Gait Pattern/deviations: Step-through pattern;Decreased stride length;Trunk flexed Gait velocity: Decreased   General Gait Details: cues for upright posture and safe use of AD as pt tends to stop for breaks and rest forearms on RW; 3 standing rest breaks   Stairs             Wheelchair  Mobility    Modified Rankin (Stroke Patients Only)       Balance Overall balance assessment: Needs assistance Sitting-balance support: Feet supported;Bilateral upper extremity supported Sitting balance-Leahy Scale: Fair Sitting balance - Comments: pt able to sit EOB without UE support   Standing balance support: Bilateral upper extremity supported Standing balance-Leahy Scale: Poor Standing balance comment: reliant on bil UE support for standing                            Cognition Arousal/Alertness: Awake/alert Behavior During Therapy: Flat affect Overall Cognitive Status: Impaired/Different from baseline Area of Impairment: Problem solving;Safety/judgement                         Safety/Judgement: Decreased awareness of safety;Decreased awareness of deficits   Problem Solving: Slow processing;Requires verbal cues General Comments: pt very slow to respond to questions and commands with delayed initiation      Exercises      General Comments        Pertinent Vitals/Pain      Home Living                      Prior Function            PT Goals (current goals can now be found in the care plan section) Acute Rehab PT Goals Patient Stated Goal: return home Progress towards PT goals: Progressing toward goals    Frequency  Min 3X/week      PT Plan Current plan remains appropriate    Co-evaluation              AM-PAC PT "6 Clicks" Mobility   Outcome Measure  Help needed turning from your back to your side while in a flat bed without using bedrails?: A Little Help needed moving from lying on your back to sitting on the side of a flat bed without using bedrails?: A Little Help needed moving to and from a bed to a chair (including a wheelchair)?: A Little Help needed standing up from a chair using your arms (e.g., wheelchair or bedside chair)?: A Little Help needed to walk in hospital room?: A Little Help needed  climbing 3-5 steps with a railing? : A Lot 6 Click Score: 17    End of Session Equipment Utilized During Treatment: Gait belt;Oxygen Activity Tolerance: Patient limited by fatigue Patient left: in chair;with call bell/phone within reach;with chair alarm set;with family/visitor present Nurse Communication: Mobility status PT Visit Diagnosis: Other abnormalities of gait and mobility (R26.89);Muscle weakness (generalized) (M62.81);Difficulty in walking, not elsewhere classified (R26.2)     Time: 9038-3338 PT Time Calculation (min) (ACUTE ONLY): 26 min  Charges:  $Gait Training: 23-37 mins                     Earney Navy, PTA Acute Rehabilitation Services Pager: [phone] Office: [phone]     Darliss Cheney 06/13/2019, 11:33 AM

## 2019-06-13 NOTE — TOC Transition Note (Signed)
Transition of Care [facility]) - CM/SW Discharge Note   Patient Details  Name: Rayshun Kandler MRN: [id number] Date of Birth: 06/14/1946  [facility] CM/SW Contact:  Zenon Mayo, RN Phone Number: 06/13/2019, 1:02 PM   Clinical Narrative:    Patient for dc today, referral was made to [facility] from previous NCM.  This NCM informed Mateo Flow with [facility] that patient is for dc today. Soc to begin 24-48 hrs post dc.   Final next level of care: [place] Barriers to Discharge: No Barriers Identified   Patient Goals and CMS Choice Patient states their goals for this hospitalization and ongoing recovery are:: get better CMS Medicare.gov Compare Post Acute Care list provided to:: Patient Choice offered to / list presented to : Patient  Discharge Placement                       Discharge Plan and Services     Post Acute Care Choice: Home Health          DME Arranged: (NA)         HH Arranged: RN, PT [place] Agency: [place] ([place]) Date [place]: 06/10/19 Time [place]: [phone] Representative spoke with at [place]: [place] Determinants of Health ([place]) Interventions     Readmission Risk Interventions Readmission Risk Prevention Plan 04/14/2019 04/14/2019 03/31/2019  Transportation Screening Complete Complete Complete  Medication Review (RN Care Manager) Complete Complete Complete  PCP or Specialist appointment within 3-5 days of discharge Complete - Not Complete  PCP/Specialist Appt Not Complete comments 6/17 with cardiology - earliest appt with HF clinic is 6/5 at 10am, CM requested [place] PCP appt as well  [place] or Home Care Consult Complete - Complete  SW Recovery Care/Counseling Consult Complete - Complete  Palliative Care Screening Not Applicable - Not [place] Not Applicable - Patient Refused  Some recent data might be hidden

## 2019-06-13 NOTE — Care Management Important Message (Signed)
Important Message  Patient Details  Name: Nicholas Caldwell MRN: [id number] Date of Birth: 08/15/1946   Medicare Important Message Given:  Yes     [name] 06/13/2019, 2:27 PM

## 2019-06-13 NOTE — Discharge Summary (Signed)
Physician Discharge Summary  Obert Espindola SWN:[id number] DOB: December 25, 1945 DOA: 06/08/2019  PCP: [facility], [facility]  Admit date: 06/08/2019 Discharge date: 06/13/2019  Recommendations for Outpatient Follow-up:  1. Follow up with PCP in 7-10 days. 2.   Follow-up Information    Clinic, [place]   Why: Please follow  up Contact information: [place] [address] [phone]        [facility] Follow up.   Why: RN, PT           Discharge Diagnoses: Principal diagnosis is #1 1. Acute on chronic respiratory failure 2. AKI on CKD III 3. Hypokalemia 4. Prolonged QT 5. OSA 6. COPD 7. Atrial fibrillation 8. HTN 9. DM II 10. Debility   Discharge Condition: Fair Disposition: Home  Diet recommendation: Heart healthy with carbohydrate modified diet.  Filed Weights   06/10/19 0610 06/12/19 0500 06/13/19 0513  Weight: 119.7 kg 119.4 kg 116.4 kg    History of present illness:  Nicholas Caldwell is a 73 y.o. male with medical history significant of afib; chronic systolic CHF; HTN; HLD; DM: COPD on 3L home O2; CAD; and chronic pain presenting with SOB.  The patient was somnolent at the time of my evaluation (wife reports that he stays up late every night and sleeps until noon daily) and so was unable/unwilling to answer questions.  He was somnolent but in NAD.  HPI per Dr. Dina Rich: Patient reports 3-day history of worsening shortness of breath. He reports orthopnea. He has had occasional chest pain but no chest pain right now. He at baseline is on 3 L of nasal cannula while at home. He denies any recent changes in medication and reports compliance with medication. He states he has had difficulty urinating and has not urinated in over 24 hours. He also reports 4 to 5-day history of no bowel movement. Denies any abdominal pain. Denies fever or cough. He has been using his inhaler at home with minimal relief. He has noted lower  extremity swelling and weight gain.  Hospital Course:  The patient was admitted to a telemetry bed. Cardiology was consulted. The patient is being diuresed. He has been evaluated by PT and they have recommended that the patient discharge to home with family support. However, the patient was evaluated by nursing today, and he required 2x assist with standing and only was able to walk a few feet with assistance. Attempts to reach the patient's wife to discuss discharge plans were unsuccessful, but today the patient's wife was in the room. She stated that she is accustomed to caring for the patient at home and sees no problem with taking him home.  Today's assessment: S: The patient is resting comfortably. No new complaints. O: Vitals:  Vitals:   06/13/19 0747 06/13/19 0921  BP:  (!) 116/50  Pulse:  78  Resp:    Temp:    SpO2: 97% 98%    Constitutional:  The patient is awake, alert, and oriented x 3. No acute distress. Respiratory:   No increased work of breathing.  No wheezes or rhonchi  Positive for diffuse rales.  No tactile fremitus. Cardiovascular:   Regular rate and rhythm.  No murmurs, ectopy, or gallups  No lateral PMI. No thrills. Abdomen:   Abdomen is soft, non-tender, non-distended.  No hernias, masses, or organomegaly are appreciated.  Normoactive bowel sounds.  Musculoskeletal:   No cyanosis or clubbing  Positive for 2-3+ pitting edema Skin:   No rashes, lesions, ulcers  palpation of skin: no induration or nodules Neurologic:   CN 2-12 intact  Sensation all 4 extremities intact Psychiatric:   Mental status ? Mood, affect appropriate ? Orientation to person, place, time   judgment and insight appear intact    Discharge Instructions  Discharge Instructions    (HEART FAILURE PATIENTS) Call MD:  Anytime you have any of the following symptoms: 1) 3 pound weight gain in 24 hours or 5 pounds in 1 week 2) shortness of breath, with or without a  dry hacking cough 3) swelling in the hands, feet or stomach 4) if you have to sleep on extra pillows at night in order to breathe.   Complete by: As directed    Diet - low sodium heart healthy   Complete by: As directed    Diet Carb Modified   Complete by: As directed    Discharge instructions   Complete by: As directed    Follow up with PCP in 7-10 days.   Face-to-face encounter (required for Medicare/Medicaid patients)   Complete by: As directed    I Arnet Hofferber certify that this patient is under my care and that I, or a nurse practitioner or physician's assistant working with me, had a face-to-face encounter that meets the physician face-to-face encounter requirements with this patient on 06/13/2019. The encounter with the patient was in whole, or in part for the following medical condition(s) which is the primary reason for home health care (List medical condition): Severe peripheral edema. Needs legs wrapped daily.   The encounter with the patient was in whole, or in part, for the following medical condition, which is the primary reason for home health care: Peripheral edema   I certify that, based on my findings, the following services are medically necessary home health services:  Physical therapy Nursing     Reason for Medically Necessary Home Health Services: Skilled Nursing- Complex Wound Care   My clinical findings support the need for the above services: Unable to leave home safely without assistance and/or assistive device   Further, I certify that my clinical findings support that this patient is homebound due to: Pain interferes with ambulation/mobility   Heart Failure patients record your daily weight using the same scale at the same time of day   Complete by: As directed    Home Health   Complete by: As directed    To provide the following care/treatments: PT   Increase activity slowly   Complete by: As directed      Allergies as of 06/13/2019   No Known Allergies       Medication List    STOP taking these medications   bisoprolol 5 MG tablet Commonly known as: ZEBETA   gabapentin 600 MG tablet Commonly known as: NEURONTIN Replaced by: gabapentin 300 MG capsule     TAKE these medications   albuterol 108 (90 Base) MCG/ACT inhaler Commonly known as: VENTOLIN HFA Inhale 2 puffs into the lungs every 6 (six) hours as needed for wheezing or shortness of breath.   apixaban 5 MG Tabs tablet Commonly known as: ELIQUIS Take 1 tablet (5 mg total) by mouth 2 (two) times daily.   aspirin 81 MG EC tablet Take 1 tablet (81 mg total) by mouth daily. Start taking on: June 14, 2019   budesonide-formoterol 160-4.5 MCG/ACT inhaler Commonly known as: SYMBICORT Inhale 2 puffs into the lungs as needed (shortness of breath).   fluticasone 50 MCG/ACT nasal spray Commonly known as: FLONASE Place 2 sprays  into both nostrils daily as needed for allergies.   gabapentin 300 MG capsule Commonly known as: NEURONTIN Take 1 capsule (300 mg total) by mouth 2 (two) times daily. Replaces: gabapentin 600 MG tablet   hydrocortisone 1 % lotion Apply 1 application topically See admin instructions. Apply small amount to back twice daily for itchy rash as needed   hydrocortisone 2.5 % rectal cream Commonly known as: ANUSOL-HC Place 1 application rectally 2 (two) times daily as needed for hemorrhoids or itching.   insulin aspart 100 UNIT/ML injection Commonly known as: novoLOG Inject 4 Units into the skin 3 (three) times daily with meals.   insulin aspart protamine - aspart (70-30) 100 UNIT/ML FlexPen Commonly known as: NovoLOG Mix 70/30 FlexPen Inject 0.1 mLs (10 Units total) into the skin 2 (two) times daily.   ipratropium-albuterol 0.5-2.5 (3) MG/3ML Soln Commonly known as: DUONEB Take 3 mLs by nebulization 2 (two) times daily as needed (shortness of breath/wheezing).   isosorbide-hydrALAZINE 20-37.5 MG tablet Commonly known as: BIDIL Take 0.5 tablets by mouth  3 (three) times daily.   metolazone 2.5 MG tablet Commonly known as: ZAROXOLYN Take 1 tablet (2.5 mg total) by mouth 4 (four) times a week. Take extra Potassium 30mq when taking this medication   oxybutynin 5 MG tablet Commonly known as: DITROPAN Take 1 tablet (5 mg total) by mouth 4 (four) times daily.   oxyCODONE-acetaminophen 10-325 MG tablet Commonly known as: PERCOCET Take 1 tablet by mouth every 6 (six) hours as needed for pain.   OXYGEN Inhale 3 L into the lungs continuous.   pantoprazole 40 MG tablet Commonly known as: PROTONIX Take 1 tablet (40 mg total) by mouth 2 (two) times daily.   polyethylene glycol 17 g packet Commonly known as: MIRALAX / GLYCOLAX Take 17 g by mouth daily. What changed:   when to take this  reasons to take this   potassium chloride SA 20 MEQ tablet Commonly known as: K-DUR Take 3 tablets (60 mEq total) by mouth 2 (two) times daily. With additional 269m when you take Metolazone. What changed: how much to take   PRESCRIPTION MEDICATION Cpap   rosuvastatin 20 MG tablet Commonly known as: CRESTOR Take 1 tablet (20 mg total) by mouth daily at 6 PM.   sodium chloride 0.65 % Soln nasal spray Commonly known as: OCEAN Place 2 sprays into both nostrils as needed for congestion.   tamsulosin 0.4 MG Caps capsule Commonly known as: FLOMAX Take 1 capsule (0.4 mg total) by mouth daily after supper.   torsemide 20 MG tablet Commonly known as: DEMADEX Take 4 tablets (80 mg total) by mouth 2 (two) times daily.      No Known Allergies  The results of significant diagnostics from this hospitalization (including imaging, microbiology, ancillary and laboratory) are listed below for reference.    Significant Diagnostic Studies: Dg Chest Portable 1 View  Result Date: 06/08/2019 CLINICAL DATA:  Shortness of breath EXAM: PORTABLE CHEST 1 VIEW COMPARISON:  04/14/2019 FINDINGS: Cardiomegaly and vascular pedicle widening. Diffuse interstitial  coarsening with limited retrocardiac aeration which is stable from prior and primarily from cardiomegaly with atelectasis by chest CT. No pneumothorax. IMPRESSION: CHF pattern. Electronically Signed   By: JoMonte Fantasia.D.   On: 06/08/2019 05:17    Microbiology: Recent Results (from the past 240 hour(s))  SARS CORONAVIRUS 2 Nasal Swab Aptima Multi Swab     Status: None   Collection Time: 06/08/19  4:56 AM   Specimen: Aptima Multi Swab; Nasal  Swab  Result Value Ref Range Status   SARS Coronavirus 2 NEGATIVE NEGATIVE Final    Comment: (NOTE) SARS-CoV-2 target nucleic acids are NOT DETECTED. The SARS-CoV-2 RNA is generally detectable in upper and lower respiratory specimens during the acute phase of infection. Negative results do not preclude SARS-CoV-2 infection, do not rule out co-infections with other pathogens, and should not be used as the sole basis for treatment or other patient management decisions. Negative results must be combined with clinical observations, patient history, and epidemiological information. The expected result is Negative. Fact Sheet for Patients: SugarRoll.be Fact Sheet for Healthcare Providers: https://www.woods-mathews.com/ This test is not yet approved or cleared by the [place] FDA and  has been authorized for detection and/or diagnosis of SARS-CoV-2 by FDA under an Emergency Use Authorization (EUA). This EUA will remain  in effect (meaning this test can be used) for the duration of the COVID-19 declaration under Section 56 4(b)(1) of the Act, 21 U.S.C. section 360bbb-3(b)(1), unless the authorization is terminated or revoked sooner. Performed at [facility], [place] [address], [place], [place] [address]      Labs: Basic Metabolic Panel: Recent Labs  Lab 06/10/19 0543 06/11/19 0610 06/12/19 0526 06/12/19 1819 06/13/19 0638  NA 138 138 137 137 139  K 3.5 3.6 2.9* 2.9* 2.7*  CL 89* 88* 85*  85* 85*  CO2 36* 38* 35* 40* 41*  GLUCOSE 134* 154* 165* 130* 161*  BUN 65* 61* 60* 59* 62*  CREATININE 2.35* 2.25* 2.27* 2.33* 2.53*  CALCIUM 8.6* 8.3* 8.5* 8.6* 8.6*   Liver Function Tests: No results for input(s): AST, ALT, ALKPHOS, BILITOT, PROT, ALBUMIN in the last 168 hours. No results for input(s): LIPASE, AMYLASE in the last 168 hours. No results for input(s): AMMONIA in the last 168 hours. CBC: Recent Labs  Lab 06/08/19 0456 06/08/19 2339 06/10/19 0543 06/11/19 0610  WBC 6.3 4.8 6.5 6.2  NEUTROABS 4.3 3.5 4.8  --   HGB 10.1* 10.5* 9.4* 9.5*  HCT 35.2* 35.1* 32.4* 33.7*  MCV 89.8 86.7 89.0 90.8  PLT 373 393 374 364   Cardiac Enzymes: No results for input(s): CKTOTAL, CKMB, CKMBINDEX, TROPONINI in the last 168 hours. BNP: BNP (last 3 results) Recent Labs    03/06/19 1829 04/09/19 1959 06/08/19 0446  BNP 1,020.1* 1,843.4* 2,301.2*    ProBNP (last 3 results) No results for input(s): PROBNP in the last 8760 hours.  CBG: Recent Labs  Lab 06/12/19 1201 06/12/19 1610 06/12/19 2135 06/13/19 0558 06/13/19 1218  GLUCAP 174* 161* 104* 156* 134*    Principal Problem:   Acute on chronic systolic (congestive) heart failure (HCC) Active Problems:   Essential hypertension   Hyperlipidemia    Prolonged QT interval   COPD (chronic obstructive pulmonary disease) (HCC)   OSA (obstructive sleep apnea)   AKI (acute kidney injury) ([name])   Diabetes mellitus type 2 in obese ([place])   Acute on chronic heart failure ([place])   Time coordinating discharge: 38 minutes.  Signed:        Tyri Elmore, DO [facility]  06/13/2019, 5:14 PM

## 2019-06-14 ENCOUNTER — Other Ambulatory Visit (HOSPITAL_COMMUNITY): Payer: Self-pay

## 2019-06-14 NOTE — Progress Notes (Signed)
Paramedicine Encounter    Patient ID: Nicholas Caldwell, male    DOB: September 15, 1946, 73 y.o.   MRN: [id number]   Patient Care Team: [facility] as PCP - General Jettie Booze, MD as Consulting Physician (Cardiology) Sharmon Revere as Physician Assistant (Cardiology) [name], MD as Attending Physician (Internal Medicine) Thompson Grayer, MD as Consulting Physician (Clinical Cardiac Electrophysiology)  Patient Active Problem List   Diagnosis Date Noted  . Acute on chronic heart failure ([place]) 06/09/2019  . Lobar pneumonia ([place]) 04/14/2019  . Hyperkalemia 04/10/2019  . Cerebral embolism with cerebral infarction 03/21/2019  . Gastritis and gastroduodenitis   . NSVT (nonsustained ventricular tachycardia) ([place]) 03/08/2019  . Tobacco abuse counseling 03/08/2019  . Iron deficiency anemia   . Acute on chronic systolic CHF (congestive heart failure) ([place]) 03/06/2019  . Diabetes mellitus type 2 in obese ([place])   . Primary osteoarthritis of right hip   . Hypomagnesemia   . Steroid-induced hyperglycemia   . Supplemental oxygen dependent   . Leukocytosis   . Hypokalemia   . Acute on chronic anemia   . Diabetic peripheral neuropathy ([place])   . Acute on chronic systolic (congestive) heart failure ([place])   . Acute on chronic respiratory failure ([place]) 01/14/2019  . Hypoxia   . Altered mental status   . Heart failure ([place]) 12/30/2018  . AKI (acute kidney injury) ([place])   . Acute respiratory failure ([place]) 11/22/2018  . Acute on chronic respiratory failure with hypoxia and hypercapnia ([place]) 11/13/2018  . Metabolic encephalopathy 32/99/2426  . Acute respiratory failure with hypoxia and hypercapnia ([place]) 07/26/2018  . Acute exacerbation of CHF (congestive heart failure) ([place]) 07/26/2018  . CHF exacerbation ([place]) 05/01/2018  . CHF (congestive heart failure) ([place]) 04/30/2018  . Chronic respiratory failure with hypoxia ([place]) 12/28/2017  . Acute encephalopathy   . Atrial  fibrillation with RVR ([place])   . SVT (supraventricular tachycardia) ([place])   . COPD GOLD 0   . Medically noncompliant   . Panlobular emphysema ([place])   . OSA (obstructive sleep apnea)   . Pulmonary hypertension ([place])   . Disorientation   . Pressure injury of skin 09/07/2016  . Acute on chronic combined systolic and diastolic CHF (congestive heart failure) ([place]) 09/05/2016  . Skin lesion-left heal 09/05/2016  . Chronic systolic CHF (congestive heart failure) ([place]) 07/16/2016  . COPD (chronic obstructive pulmonary disease) ([place]) 07/16/2016  . Uncontrolled type 2 diabetes mellitus with complication ([place])   . Diabetic polyneuropathy associated with diabetes mellitus due to underlying condition ([place])   . Normocytic anemia 06/29/2016  . CAD - Non-obstructive by LHC1/16 01/21/2016  . Prolonged QT interval 10/09/2015  . Nonischemic cardiomyopathy ([place]) 10/09/2015  . PAF (paroxysmal atrial fibrillation) ([place])   . Chronic pain 02/19/2015  . Morbid obesity ([place]) 02/13/2015  . Dyspnea   . Elevated troponin I level 11/01/2014  . COPD exacerbation ([place]) 11/01/2014  . Essential hypertension 10/31/2014  . Type 2 diabetes mellitus with neuropathy 10/31/2014  . Hyperlipidemia  10/31/2014  . Cigarette smoker 10/31/2014    Current Outpatient Medications:  .  albuterol (VENTOLIN HFA) 108 (90 Base) MCG/ACT inhaler, Inhale 2 puffs into the lungs every 6 (six) hours as needed for wheezing or shortness of breath., Disp: , Rfl:  .  apixaban (ELIQUIS) 5 MG TABS tablet, Take 1 tablet (5 mg total) by mouth 2 (two) times daily., Disp: 60 tablet, Rfl: 0 .  aspirin EC 81 MG EC tablet, Take 1 tablet (81 mg total)  by mouth daily., Disp: 30 tablet, Rfl: 0 .  budesonide-formoterol (SYMBICORT) 160-4.5 MCG/ACT inhaler, Inhale 2 puffs into the lungs as needed (shortness of breath). , Disp: , Rfl:  .  fluticasone (FLONASE) 50 MCG/ACT nasal spray, Place 2 sprays into both nostrils daily as needed for allergies. , Disp: ,  Rfl:  .  gabapentin (NEURONTIN) 300 MG capsule, Take 1 capsule (300 mg total) by mouth 2 (two) times daily., Disp: 60 capsule, Rfl: 0 .  hydrocortisone (ANUSOL-HC) 2.5 % rectal cream, Place 1 application rectally 2 (two) times daily as needed for hemorrhoids or itching. , Disp: , Rfl:  .  hydrocortisone 1 % lotion, Apply 1 application topically See admin instructions. Apply small amount to back twice daily for itchy rash as needed, Disp: , Rfl:  .  insulin aspart (NOVOLOG) 100 UNIT/ML injection, Inject 4 Units into the skin 3 (three) times daily with meals., Disp: 10 mL, Rfl: 0 .  insulin aspart protamine - aspart (NOVOLOG MIX 70/30 FLEXPEN) (70-30) 100 UNIT/ML FlexPen, Inject 0.1 mLs (10 Units total) into the skin 2 (two) times daily., Disp: 15 mL, Rfl: 0 .  ipratropium-albuterol (DUONEB) 0.5-2.5 (3) MG/3ML SOLN, Take 3 mLs by nebulization 2 (two) times daily as needed (shortness of breath/wheezing). , Disp: , Rfl:  .  isosorbide-hydrALAZINE (BIDIL) 20-37.5 MG tablet, Take 0.5 tablets by mouth 3 (three) times daily., Disp: 135 tablet, Rfl: 3 .  metolazone (ZAROXOLYN) 2.5 MG tablet, Take 1 tablet (2.5 mg total) by mouth 4 (four) times a week. Take extra Potassium 53mq when taking this medication (Patient taking differently: Take 2.5 mg by mouth 4 (four) times a week. Take extra Potassium 224m when taking this medication), Disp: 30 tablet, Rfl: 3 .  oxybutynin (DITROPAN) 5 MG tablet, Take 1 tablet (5 mg total) by mouth 4 (four) times daily., Disp: 120 tablet, Rfl: 0 .  oxyCODONE-acetaminophen (PERCOCET) 10-325 MG tablet, Take 1 tablet by mouth every 6 (six) hours as needed for pain. , Disp: , Rfl:  .  OXYGEN, Inhale 3 L into the lungs continuous. , Disp: , Rfl:  .  pantoprazole (PROTONIX) 40 MG tablet, Take 1 tablet (40 mg total) by mouth 2 (two) times daily., Disp: 60 tablet, Rfl: 0 .  polyethylene glycol (MIRALAX / GLYCOLAX) packet, Take 17 g by mouth daily. (Patient taking differently: Take 17 g by  mouth daily as needed (constipation). ), Disp: 14 each, Rfl: 0 .  potassium chloride SA (K-DUR) 20 MEQ tablet, Take 3 tablets (60 mEq total) by mouth 2 (two) times daily. With additional 2056mwhen you take Metolazone., Disp: 90 tablet, Rfl: 3 .  PRESCRIPTION MEDICATION, Cpap, Disp: , Rfl:  .  rosuvastatin (CRESTOR) 20 MG tablet, Take 1 tablet (20 mg total) by mouth daily at 6 PM., Disp: 90 tablet, Rfl: 3 .  sodium chloride (OCEAN) 0.65 % SOLN nasal spray, Place 2 sprays into both nostrils as needed for congestion. , Disp: , Rfl:  .  tamsulosin (FLOMAX) 0.4 MG CAPS capsule, Take 1 capsule (0.4 mg total) by mouth daily after supper., Disp: 30 capsule, Rfl: 3 .  torsemide (DEMADEX) 20 MG tablet, Take 4 tablets (80 mg total) by mouth 2 (two) times daily., Disp: 180 tablet, Rfl: 3 No Known Allergies    Social History   Socioeconomic History  . Marital status: Married    Spouse name: Not on file  . Number of children: Not on file  . Years of education: Not on file  . Highest  education level: Not on file  Occupational History  . Not on file  Social Needs  . Financial resource strain: Not on file  . Food insecurity    Worry: Not on file    Inability: Not on file  . Transportation needs    Medical: Not on file    Non-medical: Not on file  Tobacco Use  . Smoking status: Former Smoker    Packs/day: 1.00    Years: 34.00    Pack years: 34.00    Types: Cigarettes    Quit date: 11/30/2018    Years since quitting: 0.5  . Smokeless tobacco: Never Used  Substance and Sexual Activity  . Alcohol use: No    Alcohol/week: 0.0 standard drinks  . Drug use: No  . Sexual activity: Not on file  Lifestyle  . Physical activity    Days per week: Not on file    Minutes per session: Not on file  . Stress: Not on file  Relationships  . Social Herbalist on phone: Not on file    Gets together: Not on file    Attends religious service: Not on file    Active member of club or organization:  Not on file    Attends meetings of clubs or organizations: Not on file    Relationship status: Not on file  . Intimate partner violence    Fear of current or ex partner: Not on file    Emotionally abused: Not on file    Physically abused: Not on file    Forced sexual activity: Not on file  Other Topics Concern  . Not on file  Social History Narrative  . Not on file    Physical Exam      Future Appointments  Date Time Provider [place]  06/28/2019  9:30 AM [facility] PA/NP [facility] None    BP 118/60   Pulse 80   Temp 97.9 F (36.6 C)   Resp 18   SpO2 91%   Weight yesterday-home from hosp  Last visit weight-255  Arrived for home visit post hosp d/c, there were some med changes for him, wife reports hes doing about the same.  Wife reports he does sleep a lot during the day. Up during the night. Not using the CPAP regularly.  meds verified and pill box fixed with the corrections since hosp d/c.  His ferrous sulfate doesn't say to d/c, it has just disappeared from his list. So not sure about that. Will check with lisa about that to see if she can confirm--confirmed he can continue to take it.  Made f/u appoint on aug 25 at [facility] for heart clinic.  He has pill box done for 9 days. Will come back next week.    Marylouise Stacks, [facility] Paramedic  06/14/19

## 2019-06-23 ENCOUNTER — Other Ambulatory Visit (HOSPITAL_COMMUNITY): Payer: Self-pay

## 2019-06-23 ENCOUNTER — Other Ambulatory Visit (HOSPITAL_COMMUNITY): Payer: Self-pay | Admitting: *Deleted

## 2019-06-23 DIAGNOSIS — Z9111 Patient's noncompliance with dietary regimen: Secondary | ICD-10-CM | POA: Diagnosis not present

## 2019-06-23 DIAGNOSIS — I480 Paroxysmal atrial fibrillation: Secondary | ICD-10-CM | POA: Diagnosis not present

## 2019-06-23 DIAGNOSIS — I081 Rheumatic disorders of both mitral and tricuspid valves: Secondary | ICD-10-CM | POA: Diagnosis not present

## 2019-06-23 DIAGNOSIS — Z9981 Dependence on supplemental oxygen: Secondary | ICD-10-CM | POA: Diagnosis not present

## 2019-06-23 DIAGNOSIS — I429 Cardiomyopathy, unspecified: Secondary | ICD-10-CM | POA: Diagnosis not present

## 2019-06-23 DIAGNOSIS — I0981 Rheumatic heart failure: Secondary | ICD-10-CM | POA: Diagnosis not present

## 2019-06-23 DIAGNOSIS — E1122 Type 2 diabetes mellitus with diabetic chronic kidney disease: Secondary | ICD-10-CM | POA: Diagnosis not present

## 2019-06-23 DIAGNOSIS — J449 Chronic obstructive pulmonary disease, unspecified: Secondary | ICD-10-CM | POA: Diagnosis not present

## 2019-06-23 DIAGNOSIS — N183 Chronic kidney disease, stage 3 (moderate): Secondary | ICD-10-CM | POA: Diagnosis not present

## 2019-06-23 DIAGNOSIS — Z8673 Personal history of transient ischemic attack (TIA), and cerebral infarction without residual deficits: Secondary | ICD-10-CM | POA: Diagnosis not present

## 2019-06-23 DIAGNOSIS — Z794 Long term (current) use of insulin: Secondary | ICD-10-CM | POA: Diagnosis not present

## 2019-06-23 DIAGNOSIS — G8929 Other chronic pain: Secondary | ICD-10-CM | POA: Diagnosis not present

## 2019-06-23 DIAGNOSIS — J9621 Acute and chronic respiratory failure with hypoxia: Secondary | ICD-10-CM | POA: Diagnosis not present

## 2019-06-23 DIAGNOSIS — N179 Acute kidney failure, unspecified: Secondary | ICD-10-CM | POA: Diagnosis not present

## 2019-06-23 DIAGNOSIS — I130 Hypertensive heart and chronic kidney disease with heart failure and stage 1 through stage 4 chronic kidney disease, or unspecified chronic kidney disease: Secondary | ICD-10-CM | POA: Diagnosis not present

## 2019-06-23 DIAGNOSIS — I25119 Atherosclerotic heart disease of native coronary artery with unspecified angina pectoris: Secondary | ICD-10-CM | POA: Diagnosis not present

## 2019-06-23 DIAGNOSIS — I4892 Unspecified atrial flutter: Secondary | ICD-10-CM | POA: Diagnosis not present

## 2019-06-23 DIAGNOSIS — I5082 Biventricular heart failure: Secondary | ICD-10-CM | POA: Diagnosis not present

## 2019-06-23 DIAGNOSIS — I50810 Right heart failure, unspecified: Secondary | ICD-10-CM | POA: Diagnosis not present

## 2019-06-23 DIAGNOSIS — I5023 Acute on chronic systolic (congestive) heart failure: Secondary | ICD-10-CM | POA: Diagnosis not present

## 2019-06-23 DIAGNOSIS — E785 Hyperlipidemia, unspecified: Secondary | ICD-10-CM | POA: Diagnosis not present

## 2019-06-23 DIAGNOSIS — Z79891 Long term (current) use of opiate analgesic: Secondary | ICD-10-CM | POA: Diagnosis not present

## 2019-06-23 DIAGNOSIS — E662 Morbid (severe) obesity with alveolar hypoventilation: Secondary | ICD-10-CM | POA: Diagnosis not present

## 2019-06-23 DIAGNOSIS — E876 Hypokalemia: Secondary | ICD-10-CM | POA: Diagnosis not present

## 2019-06-23 DIAGNOSIS — J9622 Acute and chronic respiratory failure with hypercapnia: Secondary | ICD-10-CM | POA: Diagnosis not present

## 2019-06-23 MED ORDER — TORSEMIDE 20 MG PO TABS: 80.0000 mg | ORAL_TABLET | Freq: Two times a day (BID) | ORAL | 3 refills | Status: DC

## 2019-06-23 NOTE — Progress Notes (Signed)
Paramedicine Encounter    Patient ID: Nicholas Caldwell, male    DOB: September 15, 1946, 73 y.o.   MRN: [id number]   Patient Care Team: [facility] PCP - General Jettie Booze, MD as Consulting Physician (Cardiology) Sharmon Revere as Physician Assistant (Cardiology) [name], MD as Attending Physician (Internal Medicine) Thompson Grayer, MD as Consulting Physician (Clinical Cardiac Electrophysiology)  Patient Active Problem List   Diagnosis Date Noted  . Acute on chronic heart failure ([place]) 06/09/2019  . Lobar pneumonia ([place]) 04/14/2019  . Hyperkalemia 04/10/2019  . Cerebral embolism with cerebral infarction 03/21/2019  . Gastritis and gastroduodenitis   . NSVT (nonsustained ventricular tachycardia) ([place]) 03/08/2019  . Tobacco abuse counseling 03/08/2019  . Iron deficiency anemia   . Acute on chronic systolic CHF (congestive heart failure) ([place]) 03/06/2019  . Diabetes mellitus type 2 in obese ([place])   . Primary osteoarthritis of right hip   . Hypomagnesemia   . Steroid-induced hyperglycemia   . Supplemental oxygen dependent   . Leukocytosis   . Hypokalemia   . Acute on chronic anemia   . Diabetic peripheral neuropathy ([place])   . Acute on chronic systolic (congestive) heart failure ([place])   . Acute on chronic respiratory failure ([place]) 01/14/2019  . Hypoxia   . Altered mental status   . Heart failure ([place]) 12/30/2018  . AKI (acute kidney injury) ([place])   . Acute respiratory failure ([place]) 11/22/2018  . Acute on chronic respiratory failure with hypoxia and hypercapnia ([place]) 11/13/2018  . Metabolic encephalopathy 32/99/2426  . Acute respiratory failure with hypoxia and hypercapnia ([place]) 07/26/2018  . Acute exacerbation of CHF (congestive heart failure) ([place]) 07/26/2018  . CHF exacerbation ([place]) 05/01/2018  . CHF (congestive heart failure) ([place]) 04/30/2018  . Chronic respiratory failure with hypoxia ([place]) 12/28/2017  . Acute encephalopathy   . Atrial  fibrillation with RVR ([place])   . SVT (supraventricular tachycardia) ([place])   . COPD GOLD 0   . Medically noncompliant   . Panlobular emphysema ([place])   . OSA (obstructive sleep apnea)   . Pulmonary hypertension ([place])   . Disorientation   . Pressure injury of skin 09/07/2016  . Acute on chronic combined systolic and diastolic CHF (congestive heart failure) ([place]) 09/05/2016  . Skin lesion-left heal 09/05/2016  . Chronic systolic CHF (congestive heart failure) ([place]) 07/16/2016  . COPD (chronic obstructive pulmonary disease) ([place]) 07/16/2016  . Uncontrolled type 2 diabetes mellitus with complication ([place])   . Diabetic polyneuropathy associated with diabetes mellitus due to underlying condition ([place])   . Normocytic anemia 06/29/2016  . CAD - Non-obstructive by LHC1/16 01/21/2016  . Prolonged QT interval 10/09/2015  . Nonischemic cardiomyopathy ([place]) 10/09/2015  . PAF (paroxysmal atrial fibrillation) ([place])   . Chronic pain 02/19/2015  . Morbid obesity ([place]) 02/13/2015  . Dyspnea   . Elevated troponin I level 11/01/2014  . COPD exacerbation ([place]) 11/01/2014  . Essential hypertension 10/31/2014  . Type 2 diabetes mellitus with neuropathy 10/31/2014  . Hyperlipidemia  10/31/2014  . Cigarette smoker 10/31/2014    Current Outpatient Medications:  .  albuterol (VENTOLIN HFA) 108 (90 Base) MCG/ACT inhaler, Inhale 2 puffs into the lungs every 6 (six) hours as needed for wheezing or shortness of breath., Disp: , Rfl:  .  apixaban (ELIQUIS) 5 MG TABS tablet, Take 1 tablet (5 mg total) by mouth 2 (two) times daily., Disp: 60 tablet, Rfl: 0 .  aspirin EC 81 MG EC tablet, Take 1 tablet (81 mg total)  by mouth daily., Disp: 30 tablet, Rfl: 0 .  budesonide-formoterol (SYMBICORT) 160-4.5 MCG/ACT inhaler, Inhale 2 puffs into the lungs as needed (shortness of breath). , Disp: , Rfl:  .  ferrous sulfate 325 (65 FE) MG tablet, Take 325 mg by mouth 2 (two) times daily., Disp: , Rfl:  .  fluticasone (FLONASE)  50 MCG/ACT nasal spray, Place 2 sprays into both nostrils daily as needed for allergies. , Disp: , Rfl:  .  gabapentin (NEURONTIN) 300 MG capsule, Take 1 capsule (300 mg total) by mouth 2 (two) times daily., Disp: 60 capsule, Rfl: 0 .  hydrocortisone (ANUSOL-HC) 2.5 % rectal cream, Place 1 application rectally 2 (two) times daily as needed for hemorrhoids or itching. , Disp: , Rfl:  .  hydrocortisone 1 % lotion, Apply 1 application topically See admin instructions. Apply small amount to back twice daily for itchy rash as needed, Disp: , Rfl:  .  insulin aspart (NOVOLOG) 100 UNIT/ML injection, Inject 4 Units into the skin 3 (three) times daily with meals., Disp: 10 mL, Rfl: 0 .  insulin aspart protamine - aspart (NOVOLOG MIX 70/30 FLEXPEN) (70-30) 100 UNIT/ML FlexPen, Inject 0.1 mLs (10 Units total) into the skin 2 (two) times daily., Disp: 15 mL, Rfl: 0 .  ipratropium-albuterol (DUONEB) 0.5-2.5 (3) MG/3ML SOLN, Take 3 mLs by nebulization 2 (two) times daily as needed (shortness of breath/wheezing). , Disp: , Rfl:  .  isosorbide-hydrALAZINE (BIDIL) 20-37.5 MG tablet, Take 0.5 tablets by mouth 3 (three) times daily., Disp: 135 tablet, Rfl: 3 .  metolazone (ZAROXOLYN) 2.5 MG tablet, Take 1 tablet (2.5 mg total) by mouth 4 (four) times a week. Take extra Potassium 4mq when taking this medication (Patient taking differently: Take 2.5 mg by mouth 4 (four) times a week. Take extra Potassium 299m when taking this medication), Disp: 30 tablet, Rfl: 3 .  oxybutynin (DITROPAN) 5 MG tablet, Take 1 tablet (5 mg total) by mouth 4 (four) times daily., Disp: 120 tablet, Rfl: 0 .  OXYGEN, Inhale 3 L into the lungs continuous. , Disp: , Rfl:  .  pantoprazole (PROTONIX) 40 MG tablet, Take 1 tablet (40 mg total) by mouth 2 (two) times daily., Disp: 60 tablet, Rfl: 0 .  polyethylene glycol (MIRALAX / GLYCOLAX) packet, Take 17 g by mouth daily. (Patient taking differently: Take 17 g by mouth daily as needed (constipation).  ), Disp: 14 each, Rfl: 0 .  potassium chloride SA (K-DUR) 20 MEQ tablet, Take 3 tablets (60 mEq total) by mouth 2 (two) times daily. With additional 2032mwhen you take Metolazone., Disp: 90 tablet, Rfl: 3 .  PRESCRIPTION MEDICATION, Cpap, Disp: , Rfl:  .  rosuvastatin (CRESTOR) 20 MG tablet, Take 1 tablet (20 mg total) by mouth daily at 6 PM., Disp: 90 tablet, Rfl: 3 .  sodium chloride (OCEAN) 0.65 % SOLN nasal spray, Place 2 sprays into both nostrils as needed for congestion. , Disp: , Rfl:  .  tamsulosin (FLOMAX) 0.4 MG CAPS capsule, Take 1 capsule (0.4 mg total) by mouth daily after supper., Disp: 30 capsule, Rfl: 3 .  oxyCODONE-acetaminophen (PERCOCET) 10-325 MG tablet, Take 1 tablet by mouth every 6 (six) hours as needed for pain. , Disp: , Rfl:  .  torsemide (DEMADEX) 20 MG tablet, Take 4 tablets (80 mg total) by mouth 2 (two) times daily., Disp: 180 tablet, Rfl: 3 No Known Allergies    Social History   Socioeconomic History  . Marital status: Married    Spouse name:  Not on file  . Number of children: Not on file  . Years of education: Not on file  . Highest education level: Not on file  Occupational History  . Not on file  Social Needs  . Financial resource strain: Not on file  . Food insecurity    Worry: Not on file    Inability: Not on file  . Transportation needs    Medical: Not on file    Non-medical: Not on file  Tobacco Use  . Smoking status: Former Smoker    Packs/day: 1.00    Years: 34.00    Pack years: 34.00    Types: Cigarettes    Quit date: 11/30/2018    Years since quitting: 0.5  . Smokeless tobacco: Never Used  Substance and Sexual Activity  . Alcohol use: No    Alcohol/week: 0.0 standard drinks  . Drug use: No  . Sexual activity: Not on file  Lifestyle  . Physical activity    Days per week: Not on file    Minutes per session: Not on file  . Stress: Not on file  Relationships  . Social Herbalist on phone: Not on file    Gets together:  Not on file    Attends religious service: Not on file    Active member of club or organization: Not on file    Attends meetings of clubs or organizations: Not on file    Relationship status: Not on file  . Intimate partner violence    Fear of current or ex partner: Not on file    Emotionally abused: Not on file    Physically abused: Not on file    Forced sexual activity: Not on file  Other Topics Concern  . Not on file  Social History Narrative  . Not on file    Physical Exam      Future Appointments  Date Time Provider [place]  06/28/2019  9:30 AM [facility] PA/NP [facility] None    BP (!) 120/58   Pulse 78   Temp (!) 97.5 F (36.4 C)   Resp 20   SpO2 93%   Weight yesterday-? Last visit weight-?  Home visit today and PT was finishing up with him when I arrived, however pt is not too active with PT when they are here. He has not heard from a nurse about his wraps on his legs yet. PT nurse will follow up with that as they do not have an order to tend to that. PT nurse actually did find the referral for [facility] to tend to the wraps.  Not really using his CPAP much.  meds verified and pill box refilled, wife asked I do not place the potassium in the pill box as she gives it to him with meals. Pt states all he does is lay around and is drowsy and feels like its his medicine causing it.   [phone].  PT nurse wants family to have discussion with dr mclean/provider next week about the absolute need of PT--the hospitalists always sends the referrals out after he is d/c but pt has either cancelled or not answered the door so many times and the few times he did let them in he really wasn't able to participate much. He is mostly bed bound and doesn't engage when they are there.  Wife got him a wheelchair for the house hoping  he will use it to get out bedroom more and move around the house.    Marylouise Stacks, [facility] Paramedic  06/23/19

## 2019-06-28 ENCOUNTER — Inpatient Hospital Stay (HOSPITAL_COMMUNITY): Admission: RE | Admit: 2019-06-28 | Payer: Non-veteran care | Source: Ambulatory Visit

## 2019-06-29 ENCOUNTER — Other Ambulatory Visit (HOSPITAL_COMMUNITY): Payer: Self-pay

## 2019-06-29 NOTE — Progress Notes (Addendum)
Paramedicine Encounter    Patient ID: Nicholas Caldwell, male    DOB: September 15, 1946, 73 y.o.   MRN: [id number]   Patient Care Team: [facility] PCP - General Jettie Booze, MD as Consulting Physician (Cardiology) Sharmon Revere as Physician Assistant (Cardiology) [name], MD as Attending Physician (Internal Medicine) Thompson Grayer, MD as Consulting Physician (Clinical Cardiac Electrophysiology)  Patient Active Problem List   Diagnosis Date Noted  . Acute on chronic heart failure ([place]) 06/09/2019  . Lobar pneumonia ([place]) 04/14/2019  . Hyperkalemia 04/10/2019  . Cerebral embolism with cerebral infarction 03/21/2019  . Gastritis and gastroduodenitis   . NSVT (nonsustained ventricular tachycardia) ([place]) 03/08/2019  . Tobacco abuse counseling 03/08/2019  . Iron deficiency anemia   . Acute on chronic systolic CHF (congestive heart failure) ([place]) 03/06/2019  . Diabetes mellitus type 2 in obese ([place])   . Primary osteoarthritis of right hip   . Hypomagnesemia   . Steroid-induced hyperglycemia   . Supplemental oxygen dependent   . Leukocytosis   . Hypokalemia   . Acute on chronic anemia   . Diabetic peripheral neuropathy ([place])   . Acute on chronic systolic (congestive) heart failure ([place])   . Acute on chronic respiratory failure ([place]) 01/14/2019  . Hypoxia   . Altered mental status   . Heart failure ([place]) 12/30/2018  . AKI (acute kidney injury) ([place])   . Acute respiratory failure ([place]) 11/22/2018  . Acute on chronic respiratory failure with hypoxia and hypercapnia ([place]) 11/13/2018  . Metabolic encephalopathy 32/99/2426  . Acute respiratory failure with hypoxia and hypercapnia ([place]) 07/26/2018  . Acute exacerbation of CHF (congestive heart failure) ([place]) 07/26/2018  . CHF exacerbation ([place]) 05/01/2018  . CHF (congestive heart failure) ([place]) 04/30/2018  . Chronic respiratory failure with hypoxia ([place]) 12/28/2017  . Acute encephalopathy   . Atrial  fibrillation with RVR ([place])   . SVT (supraventricular tachycardia) ([place])   . COPD GOLD 0   . Medically noncompliant   . Panlobular emphysema ([place])   . OSA (obstructive sleep apnea)   . Pulmonary hypertension ([place])   . Disorientation   . Pressure injury of skin 09/07/2016  . Acute on chronic combined systolic and diastolic CHF (congestive heart failure) ([place]) 09/05/2016  . Skin lesion-left heal 09/05/2016  . Chronic systolic CHF (congestive heart failure) ([place]) 07/16/2016  . COPD (chronic obstructive pulmonary disease) ([place]) 07/16/2016  . Uncontrolled type 2 diabetes mellitus with complication ([place])   . Diabetic polyneuropathy associated with diabetes mellitus due to underlying condition ([place])   . Normocytic anemia 06/29/2016  . CAD - Non-obstructive by LHC1/16 01/21/2016  . Prolonged QT interval 10/09/2015  . Nonischemic cardiomyopathy ([place]) 10/09/2015  . PAF (paroxysmal atrial fibrillation) ([place])   . Chronic pain 02/19/2015  . Morbid obesity ([place]) 02/13/2015  . Dyspnea   . Elevated troponin I level 11/01/2014  . COPD exacerbation ([place]) 11/01/2014  . Essential hypertension 10/31/2014  . Type 2 diabetes mellitus with neuropathy 10/31/2014  . Hyperlipidemia  10/31/2014  . Cigarette smoker 10/31/2014    Current Outpatient Medications:  .  albuterol (VENTOLIN HFA) 108 (90 Base) MCG/ACT inhaler, Inhale 2 puffs into the lungs every 6 (six) hours as needed for wheezing or shortness of breath., Disp: , Rfl:  .  apixaban (ELIQUIS) 5 MG TABS tablet, Take 1 tablet (5 mg total) by mouth 2 (two) times daily., Disp: 60 tablet, Rfl: 0 .  aspirin EC 81 MG EC tablet, Take 1 tablet (81 mg total)  by mouth daily., Disp: 30 tablet, Rfl: 0 .  budesonide-formoterol (SYMBICORT) 160-4.5 MCG/ACT inhaler, Inhale 2 puffs into the lungs as needed (shortness of breath). , Disp: , Rfl:  .  ferrous sulfate 325 (65 FE) MG tablet, Take 325 mg by mouth 2 (two) times daily., Disp: , Rfl:  .  fluticasone (FLONASE)  50 MCG/ACT nasal spray, Place 2 sprays into both nostrils daily as needed for allergies. , Disp: , Rfl:  .  gabapentin (NEURONTIN) 300 MG capsule, Take 1 capsule (300 mg total) by mouth 2 (two) times daily., Disp: 60 capsule, Rfl: 0 .  hydrocortisone (ANUSOL-HC) 2.5 % rectal cream, Place 1 application rectally 2 (two) times daily as needed for hemorrhoids or itching. , Disp: , Rfl:  .  hydrocortisone 1 % lotion, Apply 1 application topically See admin instructions. Apply small amount to back twice daily for itchy rash as needed, Disp: , Rfl:  .  insulin aspart (NOVOLOG) 100 UNIT/ML injection, Inject 4 Units into the skin 3 (three) times daily with meals., Disp: 10 mL, Rfl: 0 .  insulin aspart protamine - aspart (NOVOLOG MIX 70/30 FLEXPEN) (70-30) 100 UNIT/ML FlexPen, Inject 0.1 mLs (10 Units total) into the skin 2 (two) times daily., Disp: 15 mL, Rfl: 0 .  ipratropium-albuterol (DUONEB) 0.5-2.5 (3) MG/3ML SOLN, Take 3 mLs by nebulization 2 (two) times daily as needed (shortness of breath/wheezing). , Disp: , Rfl:  .  isosorbide-hydrALAZINE (BIDIL) 20-37.5 MG tablet, Take 0.5 tablets by mouth 3 (three) times daily., Disp: 135 tablet, Rfl: 3 .  metolazone (ZAROXOLYN) 2.5 MG tablet, Take 1 tablet (2.5 mg total) by mouth 4 (four) times a week. Take extra Potassium 33mq when taking this medication (Patient taking differently: Take 2.5 mg by mouth 4 (four) times a week. Take extra Potassium 260m when taking this medication), Disp: 30 tablet, Rfl: 3 .  oxybutynin (DITROPAN) 5 MG tablet, Take 1 tablet (5 mg total) by mouth 4 (four) times daily., Disp: 120 tablet, Rfl: 0 .  oxyCODONE-acetaminophen (PERCOCET) 10-325 MG tablet, Take 1 tablet by mouth every 6 (six) hours as needed for pain. , Disp: , Rfl:  .  OXYGEN, Inhale 3 L into the lungs continuous. , Disp: , Rfl:  .  pantoprazole (PROTONIX) 40 MG tablet, Take 1 tablet (40 mg total) by mouth 2 (two) times daily., Disp: 60 tablet, Rfl: 0 .  polyethylene  glycol (MIRALAX / GLYCOLAX) packet, Take 17 g by mouth daily. (Patient taking differently: Take 17 g by mouth daily as needed (constipation). ), Disp: 14 each, Rfl: 0 .  potassium chloride SA (K-DUR) 20 MEQ tablet, Take 3 tablets (60 mEq total) by mouth 2 (two) times daily. With additional 209mwhen you take Metolazone., Disp: 90 tablet, Rfl: 3 .  PRESCRIPTION MEDICATION, Cpap, Disp: , Rfl:  .  rosuvastatin (CRESTOR) 20 MG tablet, Take 1 tablet (20 mg total) by mouth daily at 6 PM., Disp: 90 tablet, Rfl: 3 .  sodium chloride (OCEAN) 0.65 % SOLN nasal spray, Place 2 sprays into both nostrils as needed for congestion. , Disp: , Rfl:  .  tamsulosin (FLOMAX) 0.4 MG CAPS capsule, Take 1 capsule (0.4 mg total) by mouth daily after supper., Disp: 30 capsule, Rfl: 3 .  torsemide (DEMADEX) 20 MG tablet, Take 4 tablets (80 mg total) by mouth 2 (two) times daily., Disp: 180 tablet, Rfl: 3 No Known Allergies    Social History   Socioeconomic History  . Marital status: Married    Spouse name:  Not on file  . Number of children: Not on file  . Years of education: Not on file  . Highest education level: Not on file  Occupational History  . Not on file  Social Needs  . Financial resource strain: Not on file  . Food insecurity    Worry: Not on file    Inability: Not on file  . Transportation needs    Medical: Not on file    Non-medical: Not on file  Tobacco Use  . Smoking status: Former Smoker    Packs/day: 1.00    Years: 34.00    Pack years: 34.00    Types: Cigarettes    Quit date: 11/30/2018    Years since quitting: 0.5  . Smokeless tobacco: Never Used  Substance and Sexual Activity  . Alcohol use: No    Alcohol/week: 0.0 standard drinks  . Drug use: No  . Sexual activity: Not on file  Lifestyle  . Physical activity    Days per week: Not on file    Minutes per session: Not on file  . Stress: Not on file  Relationships  . Social Herbalist on phone: Not on file    Gets  together: Not on file    Attends religious service: Not on file    Active member of club or organization: Not on file    Attends meetings of clubs or organizations: Not on file    Relationship status: Not on file  . Intimate partner violence    Fear of current or ex partner: Not on file    Emotionally abused: Not on file    Physically abused: Not on file    Forced sexual activity: Not on file  Other Topics Concern  . Not on file  Social History Narrative  . Not on file    Physical Exam      Future Appointments  Date Time Provider [place]  07/22/2019 12:00 PM [facility] PA/NP [facility] None     BP (!) 100/42   Pulse 90   Temp 98.1 F (36.7 C)   Resp (!) 22   SpO2 93%   Weight yesterday-a few days ago--253 Last visit weight-? Not been weighing  Pt was sitting up when I arrived, eating his lunch.  pts wife states home health nurse should be out this week to change his leg wraps. She also said PT is coming back out as well.  meds verified and pill box refilled. He will be out of torsemide on 9/2 after the AM dose. She re-ordered torsemide last week.  He needs metolazone sent to [facility].  I will go back out to see him on 9/9.  Legs still wrapped.  Lungs sound terrible--wheezing all over. He states he has been using mucinex--I told him that was not a substitute for his inhalers or neb tx. He states he has been feeling dizzy when he gets up to walk, his b/p is on the lower side today. But he is not wanting to get up right now to check orthostatics.  He has not been weighing daily. Last he weighed was a few days ago.  He states his meds are making him sleepy and want to lay around all day. I told him at next clinic visit to bring that up. He cancelled his clinic visit this week due to it being too early for him to go.   Marylouise Stacks, [facility] Paramedic  06/29/19

## 2019-06-30 ENCOUNTER — Other Ambulatory Visit (HOSPITAL_COMMUNITY): Payer: Self-pay | Admitting: *Deleted

## 2019-06-30 MED ORDER — METOLAZONE 2.5 MG PO TABS: 2.5000 mg | ORAL_TABLET | ORAL | 3 refills | Status: DC

## 2019-07-05 ENCOUNTER — Telehealth (HOSPITAL_COMMUNITY): Payer: Self-pay

## 2019-07-05 NOTE — Telephone Encounter (Signed)
I spoke to pts wife regarding if she got his torsemide rx in the mail yet, the [facility] did not send it out so we need to get it at local pharmacy. I called [facility] and they had it on file and will fill it and pts wife will pick it up.  She also said he needed oxybutynin refilled however it was not prescribed by HF providers so I explained to her that her PCP will have to take over refills of that and she needed to call [facility] PCP, she understood. Pt will run out of torsemide in his pill box after tomor morning and pts wife reports she is able to place it where it needs to go.  I will f/u to see her next week   Marylouise Stacks, EMT-Paramedic 07/05/19

## 2019-07-07 ENCOUNTER — Emergency Department (HOSPITAL_COMMUNITY): Payer: No Typology Code available for payment source

## 2019-07-07 ENCOUNTER — Inpatient Hospital Stay (HOSPITAL_COMMUNITY)
Admission: EM | Admit: 2019-07-07 | Discharge: 2019-07-22 | DRG: 291 | Disposition: A | Discharge status: Home Health | Payer: No Typology Code available for payment source | Attending: Internal Medicine | Admitting: Internal Medicine

## 2019-07-07 ENCOUNTER — Other Ambulatory Visit: Payer: Self-pay

## 2019-07-07 DIAGNOSIS — Z7982 Long term (current) use of aspirin: Secondary | ICD-10-CM

## 2019-07-07 DIAGNOSIS — E1122 Type 2 diabetes mellitus with diabetic chronic kidney disease: Secondary | ICD-10-CM | POA: Diagnosis present

## 2019-07-07 DIAGNOSIS — I509 Heart failure, unspecified: Secondary | ICD-10-CM

## 2019-07-07 DIAGNOSIS — I110 Hypertensive heart disease with heart failure: Secondary | ICD-10-CM | POA: Diagnosis not present

## 2019-07-07 DIAGNOSIS — I959 Hypotension, unspecified: Secondary | ICD-10-CM | POA: Diagnosis not present

## 2019-07-07 DIAGNOSIS — Z87891 Personal history of nicotine dependence: Secondary | ICD-10-CM

## 2019-07-07 DIAGNOSIS — N179 Acute kidney failure, unspecified: Secondary | ICD-10-CM

## 2019-07-07 DIAGNOSIS — Z6838 Body mass index (BMI) 38.0-38.9, adult: Secondary | ICD-10-CM

## 2019-07-07 DIAGNOSIS — R7989 Other specified abnormal findings of blood chemistry: Secondary | ICD-10-CM | POA: Diagnosis not present

## 2019-07-07 DIAGNOSIS — I4510 Unspecified right bundle-branch block: Secondary | ICD-10-CM | POA: Diagnosis present

## 2019-07-07 DIAGNOSIS — G8929 Other chronic pain: Secondary | ICD-10-CM | POA: Diagnosis present

## 2019-07-07 DIAGNOSIS — R600 Localized edema: Secondary | ICD-10-CM | POA: Diagnosis not present

## 2019-07-07 DIAGNOSIS — Z8249 Family history of ischemic heart disease and other diseases of the circulatory system: Secondary | ICD-10-CM

## 2019-07-07 DIAGNOSIS — R609 Edema, unspecified: Secondary | ICD-10-CM

## 2019-07-07 DIAGNOSIS — Z79899 Other long term (current) drug therapy: Secondary | ICD-10-CM

## 2019-07-07 DIAGNOSIS — J441 Chronic obstructive pulmonary disease with (acute) exacerbation: Secondary | ICD-10-CM | POA: Diagnosis not present

## 2019-07-07 DIAGNOSIS — I428 Other cardiomyopathies: Secondary | ICD-10-CM | POA: Diagnosis present

## 2019-07-07 DIAGNOSIS — E1140 Type 2 diabetes mellitus with diabetic neuropathy, unspecified: Secondary | ICD-10-CM

## 2019-07-07 DIAGNOSIS — E1152 Type 2 diabetes mellitus with diabetic peripheral angiopathy with gangrene: Secondary | ICD-10-CM | POA: Diagnosis present

## 2019-07-07 DIAGNOSIS — Z9981 Dependence on supplemental oxygen: Secondary | ICD-10-CM

## 2019-07-07 DIAGNOSIS — E785 Hyperlipidemia, unspecified: Secondary | ICD-10-CM | POA: Diagnosis present

## 2019-07-07 DIAGNOSIS — I5043 Acute on chronic combined systolic (congestive) and diastolic (congestive) heart failure: Secondary | ICD-10-CM | POA: Diagnosis present

## 2019-07-07 DIAGNOSIS — I480 Paroxysmal atrial fibrillation: Secondary | ICD-10-CM | POA: Diagnosis not present

## 2019-07-07 DIAGNOSIS — R0600 Dyspnea, unspecified: Secondary | ICD-10-CM

## 2019-07-07 DIAGNOSIS — E1142 Type 2 diabetes mellitus with diabetic polyneuropathy: Secondary | ICD-10-CM | POA: Diagnosis present

## 2019-07-07 DIAGNOSIS — I10 Essential (primary) hypertension: Secondary | ICD-10-CM | POA: Diagnosis not present

## 2019-07-07 DIAGNOSIS — R739 Hyperglycemia, unspecified: Secondary | ICD-10-CM

## 2019-07-07 DIAGNOSIS — Z9119 Patient's noncompliance with other medical treatment and regimen: Secondary | ICD-10-CM

## 2019-07-07 DIAGNOSIS — Z7189 Other specified counseling: Secondary | ICD-10-CM

## 2019-07-07 DIAGNOSIS — Z993 Dependence on wheelchair: Secondary | ICD-10-CM

## 2019-07-07 DIAGNOSIS — Z20828 Contact with and (suspected) exposure to other viral communicable diseases: Secondary | ICD-10-CM | POA: Diagnosis present

## 2019-07-07 DIAGNOSIS — R5383 Other fatigue: Secondary | ICD-10-CM

## 2019-07-07 DIAGNOSIS — E662 Morbid (severe) obesity with alveolar hypoventilation: Secondary | ICD-10-CM | POA: Diagnosis present

## 2019-07-07 DIAGNOSIS — T402X5A Adverse effect of other opioids, initial encounter: Secondary | ICD-10-CM | POA: Diagnosis not present

## 2019-07-07 DIAGNOSIS — Z794 Long term (current) use of insulin: Secondary | ICD-10-CM

## 2019-07-07 DIAGNOSIS — Z7901 Long term (current) use of anticoagulants: Secondary | ICD-10-CM

## 2019-07-07 DIAGNOSIS — F1120 Opioid dependence, uncomplicated: Secondary | ICD-10-CM | POA: Diagnosis present

## 2019-07-07 DIAGNOSIS — I130 Hypertensive heart and chronic kidney disease with heart failure and stage 1 through stage 4 chronic kidney disease, or unspecified chronic kidney disease: Principal | ICD-10-CM | POA: Diagnosis present

## 2019-07-07 DIAGNOSIS — E876 Hypokalemia: Secondary | ICD-10-CM

## 2019-07-07 DIAGNOSIS — Z515 Encounter for palliative care: Secondary | ICD-10-CM | POA: Diagnosis not present

## 2019-07-07 DIAGNOSIS — R0602 Shortness of breath: Secondary | ICD-10-CM | POA: Diagnosis not present

## 2019-07-07 DIAGNOSIS — Z5320 Procedure and treatment not carried out because of patient's decision for unspecified reasons: Secondary | ICD-10-CM | POA: Diagnosis not present

## 2019-07-07 DIAGNOSIS — I2510 Atherosclerotic heart disease of native coronary artery without angina pectoris: Secondary | ICD-10-CM | POA: Diagnosis present

## 2019-07-07 DIAGNOSIS — I472 Ventricular tachycardia: Secondary | ICD-10-CM | POA: Diagnosis not present

## 2019-07-07 DIAGNOSIS — R778 Other specified abnormalities of plasma proteins: Secondary | ICD-10-CM | POA: Diagnosis present

## 2019-07-07 DIAGNOSIS — R42 Dizziness and giddiness: Secondary | ICD-10-CM | POA: Diagnosis not present

## 2019-07-07 DIAGNOSIS — J9621 Acute and chronic respiratory failure with hypoxia: Secondary | ICD-10-CM | POA: Diagnosis not present

## 2019-07-07 DIAGNOSIS — J9622 Acute and chronic respiratory failure with hypercapnia: Secondary | ICD-10-CM | POA: Diagnosis not present

## 2019-07-07 DIAGNOSIS — N184 Chronic kidney disease, stage 4 (severe): Secondary | ICD-10-CM | POA: Diagnosis present

## 2019-07-07 DIAGNOSIS — I878 Other specified disorders of veins: Secondary | ICD-10-CM | POA: Diagnosis present

## 2019-07-07 DIAGNOSIS — E7800 Pure hypercholesterolemia, unspecified: Secondary | ICD-10-CM | POA: Diagnosis present

## 2019-07-07 LAB — CBC WITH DIFFERENTIAL/PLATELET
Abs Immature Granulocytes: 0.02 10*3/uL (ref 0.00–0.07)
Basophils Absolute: 0 10*3/uL (ref 0.0–0.1)
Basophils Relative: 0 %
Eosinophils Absolute: 0 10*3/uL (ref 0.0–0.5)
Eosinophils Relative: 0 %
HCT: 32.5 % — ABNORMAL LOW (ref 39.0–52.0)
Hemoglobin: 9.6 g/dL — ABNORMAL LOW (ref 13.0–17.0)
Immature Granulocytes: 0 %
Lymphocytes Relative: 10 %
Lymphs Abs: 0.6 10*3/uL — ABNORMAL LOW (ref 0.7–4.0)
MCH: 25.1 pg — ABNORMAL LOW (ref 26.0–34.0)
MCHC: 29.5 g/dL — ABNORMAL LOW (ref 30.0–36.0)
MCV: 85.1 fL (ref 80.0–100.0)
Monocytes Absolute: 0.6 10*3/uL (ref 0.1–1.0)
Monocytes Relative: 11 %
Neutro Abs: 4.4 10*3/uL (ref 1.7–7.7)
Neutrophils Relative %: 79 %
Platelets: 355 10*3/uL (ref 150–400)
RBC: 3.82 MIL/uL — ABNORMAL LOW (ref 4.22–5.81)
RDW: 17.3 % — ABNORMAL HIGH (ref 11.5–15.5)
WBC: 5.6 10*3/uL (ref 4.0–10.5)
nRBC: 0 % (ref 0.0–0.2)

## 2019-07-07 LAB — COMPREHENSIVE METABOLIC PANEL
ALT: 12 U/L (ref 0–44)
AST: 23 U/L (ref 15–41)
Albumin: 3.1 g/dL — ABNORMAL LOW (ref 3.5–5.0)
Alkaline Phosphatase: 91 U/L (ref 38–126)
Anion gap: 14 (ref 5–15)
BUN: 60 mg/dL — ABNORMAL HIGH (ref 8–23)
CO2: 35 mmol/L — ABNORMAL HIGH (ref 22–32)
Calcium: 8.7 mg/dL — ABNORMAL LOW (ref 8.9–10.3)
Chloride: 84 mmol/L — ABNORMAL LOW (ref 98–111)
Creatinine, Ser: 2.4 mg/dL — ABNORMAL HIGH (ref 0.61–1.24)
GFR calc Af Amer: 30 mL/min — ABNORMAL LOW (ref >60)
GFR calc non Af Amer: 26 mL/min — ABNORMAL LOW (ref >60)
Glucose, Bld: 111 mg/dL — ABNORMAL HIGH (ref 70–99)
Potassium: 4 mmol/L (ref 3.5–5.1)
Sodium: 133 mmol/L — ABNORMAL LOW (ref 135–145)
Total Bilirubin: 0.3 mg/dL (ref 0.3–1.2)
Total Protein: 6.9 g/dL (ref 6.5–8.1)

## 2019-07-07 LAB — SARS CORONAVIRUS 2 BY RT PCR (HOSPITAL ORDER, PERFORMED IN ~~LOC~~ HOSPITAL LAB): SARS Coronavirus 2: NEGATIVE

## 2019-07-07 LAB — TROPONIN I (HIGH SENSITIVITY)
Troponin I (High Sensitivity): 37 ng/L — ABNORMAL HIGH (ref <18)
Troponin I (High Sensitivity): 40 ng/L — ABNORMAL HIGH (ref <18)

## 2019-07-07 LAB — GLUCOSE, CAPILLARY: Glucose-Capillary: 144 mg/dL — ABNORMAL HIGH (ref 70–99)

## 2019-07-07 LAB — BRAIN NATRIURETIC PEPTIDE: B Natriuretic Peptide: 2211.9 pg/mL — ABNORMAL HIGH (ref 0.0–100.0)

## 2019-07-07 LAB — LACTIC ACID, PLASMA
Lactic Acid, Venous: 1.8 mmol/L (ref 0.5–1.9)
Lactic Acid, Venous: 1.4 mmol/L (ref 0.5–1.9)

## 2019-07-07 MED ORDER — OXYCODONE HCL 5 MG PO TABS
5.0000 mg | ORAL_TABLET | Freq: Four times a day (QID) | ORAL | Status: DC | PRN
  Administered 2019-07-07 – 2019-07-09 (×5): 5 mg via ORAL
  Filled 2019-07-07 (×5): qty 1

## 2019-07-07 MED ORDER — ACETAMINOPHEN 650 MG RE SUPP: 650.0000 mg | Freq: Four times a day (QID) | RECTAL | Status: DC | PRN

## 2019-07-07 MED ORDER — OXYCODONE-ACETAMINOPHEN 5-325 MG PO TABS
1.0000 | ORAL_TABLET | Freq: Four times a day (QID) | ORAL | Status: DC | PRN
  Administered 2019-07-07 – 2019-07-09 (×5): 1 via ORAL
  Filled 2019-07-07 (×5): qty 1

## 2019-07-07 MED ORDER — SODIUM CHLORIDE 0.9% FLUSH
3.0000 mL | Freq: Two times a day (BID) | INTRAVENOUS | Status: DC
  Administered 2019-07-08 – 2019-07-21 (×28): 3 mL via INTRAVENOUS

## 2019-07-07 MED ORDER — SODIUM CHLORIDE 0.9 % IV SOLN: 250.0000 mL | INTRAVENOUS | Status: DC | PRN

## 2019-07-07 MED ORDER — FUROSEMIDE 10 MG/ML IJ SOLN
80.0000 mg | Freq: Once | INTRAMUSCULAR | Status: AC
  Administered 2019-07-07: 21:00:00 80 mg via INTRAVENOUS
  Filled 2019-07-07: qty 8

## 2019-07-07 MED ORDER — SODIUM CHLORIDE 0.9% FLUSH
3.0000 mL | INTRAVENOUS | Status: DC | PRN
  Administered 2019-07-13: 3 mL via INTRAVENOUS
  Filled 2019-07-07: qty 3

## 2019-07-07 MED ORDER — ENOXAPARIN SODIUM 30 MG/0.3ML ~~LOC~~ SOLN: 30.0000 mg | SUBCUTANEOUS | Status: DC

## 2019-07-07 MED ORDER — INSULIN ASPART 100 UNIT/ML ~~LOC~~ SOLN
0.0000 [IU] | Freq: Three times a day (TID) | SUBCUTANEOUS | Status: DC
  Administered 2019-07-08: 08:00:00 1 [IU] via SUBCUTANEOUS
  Administered 2019-07-08: 2 [IU] via SUBCUTANEOUS

## 2019-07-07 MED ORDER — ACETAMINOPHEN 325 MG PO TABS
650.0000 mg | ORAL_TABLET | Freq: Four times a day (QID) | ORAL | Status: DC | PRN
  Administered 2019-07-08 – 2019-07-10 (×2): 650 mg via ORAL
  Filled 2019-07-07 (×3): qty 2

## 2019-07-07 MED ORDER — ALBUTEROL SULFATE HFA 108 (90 BASE) MCG/ACT IN AERS
6.0000 | INHALATION_SPRAY | Freq: Once | RESPIRATORY_TRACT | Status: AC
  Administered 2019-07-07: 21:00:00 6 via RESPIRATORY_TRACT
  Filled 2019-07-07: qty 6.7

## 2019-07-07 MED ORDER — INSULIN ASPART 100 UNIT/ML ~~LOC~~ SOLN: 0.0000 [IU] | Freq: Every day | SUBCUTANEOUS | Status: DC

## 2019-07-07 NOTE — H&P (Addendum)
TRH H&P    Patient Demographics:    Nicholas Caldwell, is a 73 y.o. male  MRN: [id number]  DOB - [birth year]  Admit Date - 07/07/2019  Referring MD/NP/PA:   Shelby Dubin  Outpatient Primary MD for the patient is [facility]  Patient coming from:  home  Chief complaint-  Dyspnea,    HPI:    Nicholas Caldwell  is a 73 y.o. male,  w hypertension, hyperlipidemia, Pafib, CAD, CHF (EF 40-45%) , Dm2, w neuropathy, Copd on home o2 3L [place], ? OSA, w recent admission 06/10/2019 for acute on chronic respiratory failure secondary to CHF,  apparently c/o dyspnea for the past 3 week, as well as weight gain of about 15lbs per pt. His breathing has been worse for the past 3 days.  Slight cough, slight orthopnea and lower ext edema.  Weights in computer   116.4 kg (06/13/2019)  -> 128.8 kg (07/07/2019)  Patient denies fever chills chest pain palpitations, nausea vomiting, abdominal pain, diarrhea, bright red blood per rectum.  In ED, Temperature 98.2, pulse 78, respirations 20, blood pressure 113/53, pulse ox 99% on room air, patient desaturated to 74% with sitting up  . CXR IMPRESSION: Cardiomegaly with interstitial edema and layering effusions.  BNP  2211.9 Sodium 133, potassium 4.0 Glucose 111 BUN 60, creatinine 2.4 Albumin 3.1 AST 23, ALT 12 Lactic acid 1.8 WBC 5.6, hemoglobin 9.6, platelet count 355 Troponin I 37  COVID-19- negative  EKG: Normal sinus rhythm at 80, normal axis, normal intervals, incomplete right bundle branch block, no ST, T-segment changes consistent with ischemia ( no change from ekg 06/09/2019)  Patient will be admitted for acute on chronic CHF   Review of cardiac cath 12/06/2018 by Loralie Champagne Left Main  No significant disease.  Left Anterior Descending  50-60% distal LAD stenosis.  Ramus Intermedius  Moderate vessel, no significant disease.  Left Circumflex  50% proximal LCx  stenosis. Large vessel, co-dominant.  Right Coronary Artery  Relatively small RCA. 50% proximal stenosis.      Review of systems:    In addition to the HPI above,  No Fever-chills, No Headache, No changes with Vision or hearing, No problems swallowing food or Liquids, No Chest pain, No Abdominal pain, No Nausea or Vomiting, bowel movements are regular, No Blood in stool or Urine, No dysuria, No new skin rashes or bruises, No new joints pains-aches,  No new weakness, tingling, numbness in any extremity, No recent weight gain or loss, No polyuria, polydypsia or polyphagia, No significant Mental Stressors.  All other systems reviewed and are negative.    Past History of the following :    Past Medical History:  Diagnosis Date  . Atrial flutter ([place])    a. recurrent AFlutter with RVR;  b. Amiodarone Rx started 4/16  . CAD (coronary artery disease)    a. LHC 1/16:  mLAD diffuse disease, pLCx mild disease, dLCx with disease but too small for PCI, RCA ok, EF 25-30%  . Chronic pain   . Chronic systolic CHF (congestive heart  failure) ([place])   . COPD (chronic obstructive pulmonary disease) ([place])   . Diabetes mellitus without complication ([place])   . Hypercholesteremia   . Hypertension   . NICM (nonischemic cardiomyopathy) ([place])    a.dx 2016. b. 2D echo 06/2016 - Last echo 07/01/16: mod dilated LV, mod LVH, EF 25-30%, mild-mod MR, sev LAE, mild-mod reduced RV systolic function, mild-mod TR, PASP 55mHG.  .Marland KitchenPAF (paroxysmal atrial fibrillation) (HCC)    On amio - ot a candidate for flecainide due to cardiomyopathy, not a candidate for Tikosyn due to prolonged QT, and felt to be a poor candidate for ablation given left atrial size.  . Pulmonary hypertension ([place]   . Tobacco abuse       Past Surgical History:  Procedure Laterality Date  . BIOPSY  03/11/2019   Procedure: BIOPSY;  Surgeon: CLavena Bullion DO;  [facility]  Service: Gastroenterology;;  . BIOPSY  03/15/2019    Procedure: BIOPSY;  Surgeon: MIrving Copas, MD;  [facility]  Service: Gastroenterology;;  . CARDIOVERSION N/A 07/30/2018   Procedure: CARDIOVERSION;  Surgeon: MLarey Dresser MD;  [facility];  Service: Cardiovascular;  Laterality: N/A;  . COLONOSCOPY N/A 03/11/2019   Procedure: COLONOSCOPY;  Surgeon: CLavena Bullion DO;  [place] [facility]  Service: Gastroenterology;  Laterality: N/A;  . ENTEROSCOPY N/A 03/15/2019   Procedure: ENTEROSCOPY;  Surgeon: MRush LandmarkGTelford Nab, MD;  [facility] [facility]  Service: Gastroenterology;  Laterality: N/A;  . ESOPHAGOGASTRODUODENOSCOPY Left 03/11/2019   Procedure: ESOPHAGOGASTRODUODENOSCOPY (EGD);  Surgeon: CLavena Bullion DO;  [facility];  Service: Gastroenterology;  Laterality: Left;  . LEFT AND RIGHT HEART CATHETERIZATION WITH CORONARY ANGIOGRAM N/A 11/06/2014   Procedure: LEFT AND RIGHT HEART CATHETERIZATION WITH CORONARY ANGIOGRAM;  Surgeon: JJettie Booze MD;  [facility];  Service: Cardiovascular;  Laterality: N/A;  . RIGHT/LEFT HEART CATH AND CORONARY ANGIOGRAPHY N/A 12/06/2018   Procedure: RIGHT/LEFT HEART CATH AND CORONARY ANGIOGRAPHY;  Surgeon: MLarey Dresser MD;  [facility];  Service: Cardiovascular;  Laterality: N/A;  . SPLENECTOMY    . SUBMUCOSAL TATTOO INJECTION  03/15/2019   Procedure: SUBMUCOSAL TATTOO INJECTION;  Surgeon: MRush LandmarkGTelford Nab, MD;  [facility];  Service: Gastroenterology;;      Social History:      Social History   Tobacco Use  . Smoking status: Former Smoker    Packs/day: 1.00    Years: 34.00    Pack years: 34.00    Types: Cigarettes    Quit date: 11/30/2018    Years since quitting: 0.6  . Smokeless tobacco: Never Used  Substance Use Topics  . Alcohol use: No    Alcohol/week: 0.0 standard drinks       Family History :     Family History  Problem Relation Age of Onset  . Heart disease Mother    . Hypertension Mother   . Heart failure Mother   . Heart disease Father   . Hypertension Sister   . Heart attack Neg Hx   . Stroke Neg Hx   . Colon cancer Neg Hx   . Esophageal cancer Neg Hx       Home Medications:   Prior to Admission medications   Medication Sig Start Date End Date Taking? Authorizing Provider  OXYGEN Inhale 3 L into the lungs continuous.    Yes [provider]  albuterol (VENTOLIN HFA) 108 (90 Base) MCG/ACT inhaler Inhale 2 puffs into the lungs every  6 (six) hours as needed for wheezing or shortness of breath.    [provider]  apixaban (ELIQUIS) 5 MG TABS tablet Take 1 tablet (5 mg total) by mouth 2 (two) times daily. 03/31/19   Kayleen Memos, DO  aspirin EC 81 MG EC tablet Take 1 tablet (81 mg total) by mouth daily. 06/14/19   Swayze, Ava, DO  budesonide-formoterol (SYMBICORT) 160-4.5 MCG/ACT inhaler Inhale 2 puffs into the lungs as needed (shortness of breath).     [provider]  ferrous sulfate 325 (65 FE) MG tablet Take 325 mg by mouth 2 (two) times daily.    [provider]  fluticasone (FLONASE) 50 MCG/ACT nasal spray Place 2 sprays into both nostrils daily as needed for allergies.     [provider]  gabapentin (NEURONTIN) 300 MG capsule Take 1 capsule (300 mg total) by mouth 2 (two) times daily. 06/13/19   Swayze, Ava, DO  hydrocortisone (ANUSOL-HC) 2.5 % rectal cream Place 1 application rectally 2 (two) times daily as needed for hemorrhoids or itching.     [provider]  hydrocortisone 1 % lotion Apply 1 application topically See admin instructions. Apply small amount to back twice daily for itchy rash as needed    [provider]  insulin aspart (NOVOLOG) 100 UNIT/ML injection Inject 4 Units into the skin 3 (three) times daily with meals. 03/31/19   Kayleen Memos, DO  insulin aspart protamine - aspart (NOVOLOG MIX 70/30 FLEXPEN) (70-30) 100 UNIT/ML FlexPen Inject 0.1 mLs (10 Units total) into  the skin 2 (two) times daily. 03/31/19   Kayleen Memos, DO  ipratropium-albuterol (DUONEB) 0.5-2.5 (3) MG/3ML SOLN Take 3 mLs by nebulization 2 (two) times daily as needed (shortness of breath/wheezing).     [provider]  isosorbide-hydrALAZINE (BIDIL) 20-37.5 MG tablet Take 0.5 tablets by mouth 3 (three) times daily. 05/18/19   Larey Dresser, MD  metolazone (ZAROXOLYN) 2.5 MG tablet Take 1 tablet (2.5 mg total) by mouth 4 (four) times a week. Take extra Potassium 28mq when taking this medication 06/30/19   MLarey Dresser MD  oxybutynin (DITROPAN) 5 MG tablet Take 1 tablet (5 mg total) by mouth 4 (four) times daily. 06/13/19   Swayze, Ava, DO  oxyCODONE-acetaminophen (PERCOCET) 10-325 MG tablet Take 1 tablet by mouth every 6 (six) hours as needed for pain.  05/16/19   [provider]  pantoprazole (PROTONIX) 40 MG tablet Take 1 tablet (40 mg total) by mouth 2 (two) times daily. 05/05/19   Clegg, Amy D, NP  polyethylene glycol (MIRALAX / GLYCOLAX) packet Take 17 g by mouth daily. Patient taking differently: Take 17 g by mouth daily as needed (constipation).  01/24/19   Angiulli, DLavon Paganini PA-C  potassium chloride SA (K-DUR) 20 MEQ tablet Take 3 tablets (60 mEq total) by mouth 2 (two) times daily. With additional 234m when you take Metolazone. 06/13/19   Swayze, Ava, DO  PRESCRIPTION MEDICATION Cpap    [provider]  rosuvastatin (CRESTOR) 20 MG tablet Take 1 tablet (20 mg total) by mouth daily at 6 PM. 05/18/19   McLarey DresserMD  sodium chloride (OCEAN) 0.65 % SOLN nasal spray Place 2 sprays into both nostrils as needed for congestion.     [provider]  tamsulosin (FLOMAX) 0.4 MG CAPS capsule Take 1 capsule (0.4 mg total) by mouth daily after supper. 04/20/19   Clegg, Amy D, NP  torsemide (DEMADEX) 20 MG tablet Take 4 tablets (  80 mg total) by mouth 2 (two) times daily. 06/23/19   Larey Dresser, MD     Allergies:    No Known Allergies   Physical  Exam:   Vitals  Blood pressure (!) 107/59, pulse 83, temperature 98.6 F (37 C), temperature source Oral, resp. rate (!) 24, height 6' (1.829 m), weight 126.9 kg, SpO2 99 %.  1.  General: Alert and oriented x3  2. Psychiatric: Euthymic  3. Neurologic: Cranial nerves II through XII intact, reflexes, 2+, symmetric, diffuse with no clonus, motor 5/5 in all 4 extremity  4. HEENMT:  Anicteric, pupils 1.5 mm, symmetric, direct, consensual, near intact  Neck: Slight JVD, no bruit  5. Respiratory : Crackles at the left lung base, no wheezing, slight decreased breath sounds at bilateral bases  6. Cardiovascular : Regular rate rhythm S1-S2 no murmurs gallops or rubs  7. Gastrointestinal:  Abdomen: Soft nontender nondistended positive bowel  8. Skin:  Extremities: No cyanosis clubbing, unable to examine extremities because they are wrapped in a Unna wrap and patient does not want me to remove  9.Musculoskeletal:  Good range of motion    Data Review:    CBC Recent Labs  Lab 07/07/19 1803  WBC 5.6  HGB 9.6*  HCT 32.5*  PLT 355  MCV 85.1  MCH 25.1*  MCHC 29.5*  RDW 17.3*  LYMPHSABS 0.6*  MONOABS 0.6  EOSABS 0.0  BASOSABS 0.0   ------------------------------------------------------------------------------------------------------------------  Results for orders placed or performed during the hospital encounter of 07/07/19 (from the past 48 hour(s))  Brain natriuretic peptide     Status: Abnormal   Collection Time: 07/07/19  5:50 PM  Result Value Ref Range   B Natriuretic Peptide 2,211.9 (H) 0.0 - 100.0 pg/mL    Comment: Performed at [facility], [place] [address], [place], [place] [address]  Comprehensive metabolic panel     Status: Abnormal   Collection Time: 07/07/19  6:03 PM  Result Value Ref Range   Sodium 133 (L) 135 - 145 mmol/L   Potassium 4.0 3.5 - 5.1 mmol/L   Chloride 84 (L) 98 - 111 mmol/L   CO2 35 (H) 22 - 32 mmol/L   Glucose, Bld 111 (H) 70 - 99  mg/dL   BUN 60 (H) 8 - 23 mg/dL   Creatinine, Ser 2.40 (H) 0.61 - 1.24 mg/dL   Calcium 8.7 (L) 8.9 - 10.3 mg/dL   Total Protein 6.9 6.5 - 8.1 g/dL   Albumin 3.1 (L) 3.5 - 5.0 g/dL   AST 23 15 - 41 U/L   ALT 12 0 - 44 U/L   Alkaline Phosphatase 91 38 - 126 U/L   Total Bilirubin 0.3 0.3 - 1.2 mg/dL   GFR calc non Af Amer 26 (L) >60 mL/min   GFR calc Af Amer 30 (L) >60 mL/min   Anion gap 14 5 - 15    Comment: Performed at [facility], [place] [address], [place], [place] [address]  CBC WITH DIFFERENTIAL     Status: Abnormal   Collection Time: 07/07/19  6:03 PM  Result Value Ref Range   WBC 5.6 4.0 - 10.5 K/uL   RBC 3.82 (L) 4.22 - 5.81 MIL/uL   Hemoglobin 9.6 (L) 13.0 - 17.0 g/dL   HCT 32.5 (L) 39.0 - 52.0 %   MCV 85.1 80.0 - 100.0 fL   MCH 25.1 (L) 26.0 - 34.0 pg   MCHC 29.5 (L) 30.0 - 36.0 g/dL   RDW 17.3 (  H) 11.5 - 15.5 %   Platelets 355 150 - 400 K/uL   nRBC 0.0 0.0 - 0.2 %   Neutrophils Relative % 79 %   Neutro Abs 4.4 1.7 - 7.7 K/uL   Lymphocytes Relative 10 %   Lymphs Abs 0.6 (L) 0.7 - 4.0 K/uL   Monocytes Relative 11 %   Monocytes Absolute 0.6 0.1 - 1.0 K/uL   Eosinophils Relative 0 %   Eosinophils Absolute 0.0 0.0 - 0.5 K/uL   Basophils Relative 0 %   Basophils Absolute 0.0 0.0 - 0.1 K/uL   Immature Granulocytes 0 %   Abs Immature Granulocytes 0.02 0.00 - 0.07 K/uL    Comment: Performed at [facility] [address], [place], [place] [address]  Troponin I (High Sensitivity)     Status: Abnormal   Collection Time: 07/07/19  6:03 PM  Result Value Ref Range   Troponin I (High Sensitivity) 37 (H) <18 ng/L    Comment: (NOTE) Elevated high sensitivity troponin I (hsTnI) values and significant  changes across serial measurements may suggest ACS but many other  chronic and acute conditions are known to elevate hsTnI results.  Refer to the "Links" section for chest pain algorithms and additional  guidance. Performed at [facility], [place] [address] [place], [place] [address]   Lactic acid, plasma     Status: None   Collection Time: 07/07/19  6:03 PM  Result Value Ref Range   Lactic Acid, Venous 1.8 0.5 - 1.9 mmol/L    Comment: Performed at [facility] [address], [place], [place] [address]  SARS Coronavirus 2 [facility] order, Performed in [facility] hospital lab) Nasopharyngeal Nasopharyngeal Swab     Status: None   Collection Time: 07/07/19  6:07 PM   Specimen: Nasopharyngeal Swab  Result Value Ref Range   SARS Coronavirus 2 NEGATIVE NEGATIVE    Comment: (NOTE) If result is NEGATIVE SARS-CoV-2 target nucleic acids are NOT DETECTED. The SARS-CoV-2 RNA is generally detectable in upper and lower  respiratory specimens during the acute phase of infection. The lowest  concentration of SARS-CoV-2 viral copies this assay can detect is 250  copies / mL. A negative result does not preclude SARS-CoV-2 infection  and should not be used as the sole basis for treatment or other  patient management decisions.  A negative result may occur with  improper specimen collection / handling, submission of specimen other  than nasopharyngeal swab, presence of viral mutation(s) within the  areas targeted by this assay, and inadequate number of viral copies  (<250 copies / mL). A negative result must be combined with clinical  observations, patient history, and epidemiological information. If result is POSITIVE SARS-CoV-2 target nucleic acids are DETECTED. The SARS-CoV-2 RNA is generally detectable in upper and lower  respiratory specimens dur ing the acute phase of infection.  Positive  results are indicative of active infection with SARS-CoV-2.  Clinical  correlation with patient history and other diagnostic information is  necessary to determine patient infection status.  Positive results do  not rule out bacterial infection or co-infection with other viruses. If result is PRESUMPTIVE POSTIVE SARS-CoV-2 nucleic acids MAY BE PRESENT.    A presumptive positive result was obtained on the submitted specimen  and confirmed on repeat testing.  While 2019 novel coronavirus  (SARS-CoV-2) nucleic acids may be present in the submitted sample  additional confirmatory testing may be necessary for epidemiological  and / or clinical management  purposes  to differentiate between  SARS-CoV-2 and other Sarbecovirus currently known to infect humans.  If clinically indicated additional testing with an alternate test  methodology [phone]) is advised. The SARS-CoV-2 RNA is generally  detectable in upper and lower respiratory sp ecimens during the acute  phase of infection. The expected result is Negative. Fact Sheet for Patients:  StrictlyIdeas.no Fact Sheet for Healthcare Providers: BankingDealers.co.za This test is not yet approved or cleared by the [place] FDA and has been authorized for detection and/or diagnosis of SARS-CoV-2 by FDA under an Emergency Use Authorization (EUA).  This EUA will remain in effect (meaning this test can be used) for the duration of the COVID-19 declaration under Section 564(b)(1) of the Act, 21 U.S.C. section 360bbb-3(b)(1), unless the authorization is terminated or revoked sooner. Performed at [facility], [place] [address], [place], [place] [address]   Lactic acid, plasma     Status: None   Collection Time: 07/07/19  7:51 PM  Result Value Ref Range   Lactic Acid, Venous 1.4 0.5 - 1.9 mmol/L    Comment: Performed at [facility] [address] [place], [place] [address]  Troponin I (High Sensitivity)     Status: Abnormal   Collection Time: 07/07/19  7:51 PM  Result Value Ref Range   Troponin I (High Sensitivity) 40 (H) <18 ng/L    Comment: (NOTE) Elevated high sensitivity troponin I (hsTnI) values and significant  changes across serial measurements may suggest ACS but many other  chronic and acute conditions are known to elevate  hsTnI results.  Refer to the "Links" section for chest pain algorithms and additional  guidance. Performed at [facility], [place] [address], [place], [place] [address]   Glucose, capillary     Status: Abnormal   Collection Time: 07/07/19 11:42 PM  Result Value Ref Range   Glucose-Capillary 144 (H) 70 - 99 mg/dL    Chemistries  Recent Labs  Lab 07/07/19 1803  NA 133*  K 4.0  CL 84*  CO2 35*  GLUCOSE 111*  BUN 60*  CREATININE 2.40*  CALCIUM 8.7*  AST 23  ALT 12  ALKPHOS 91  BILITOT 0.3   ------------------------------------------------------------------------------------------------------------------  ------------------------------------------------------------------------------------------------------------------ GFR: Estimated Creatinine Clearance: 38.9 mL/min (A) (by C-G formula based on SCr of 2.4 mg/dL (H)). Liver Function Tests: Recent Labs  Lab 07/07/19 1803  AST 23  ALT 12  ALKPHOS 91  BILITOT 0.3  PROT 6.9  ALBUMIN 3.1*   No results for input(s): LIPASE, AMYLASE in the last 168 hours. No results for input(s): AMMONIA in the last 168 hours. Coagulation Profile: No results for input(s): INR, PROTIME in the last 168 hours. Cardiac Enzymes: No results for input(s): CKTOTAL, CKMB, CKMBINDEX, TROPONINI in the last 168 hours. BNP (last 3 results) No results for input(s): PROBNP in the last 8760 hours. HbA1C: No results for input(s): HGBA1C in the last 72 hours. CBG: Recent Labs  Lab 07/07/19 2342  GLUCAP 144*   Lipid Profile: No results for input(s): CHOL, HDL, LDLCALC, TRIG, CHOLHDL, LDLDIRECT in the last 72 hours. Thyroid Function Tests: No results for input(s): TSH, T4TOTAL, FREET4, T3FREE, THYROIDAB in the last 72 hours. Anemia Panel: No results for input(s): VITAMINB12, FOLATE, FERRITIN, TIBC, IRON, RETICCTPCT in the last 72 hours.   --------------------------------------------------------------------------------------------------------------- Urine analysis:    Component Value Date/Time   COLORURINE YELLOW 06/08/2019 [place] 06/08/2019 0456   LABSPEC 1.009 06/08/2019 0456   PHURINE 6.0 06/08/2019 0456  GLUCOSEU NEGATIVE 06/08/2019 0456   HGBUR NEGATIVE 06/08/2019 0456   BILIRUBINUR NEGATIVE 06/08/2019 0456   KETONESUR NEGATIVE 06/08/2019 0456   PROTEINUR NEGATIVE 06/08/2019 0456   UROBILINOGEN 0.2 10/26/2014 2206   NITRITE NEGATIVE 06/08/2019 0456   LEUKOCYTESUR NEGATIVE 06/08/2019 0456      Imaging Results:    Dg Chest Port 1 View  Result Date: 07/07/2019 CLINICAL DATA:  Patient with shortness of breath. EXAM: PORTABLE CHEST 1 VIEW COMPARISON:  Chest radiograph 06/08/2019 FINDINGS: Monitoring leads overlie the patient. Stable cardiomegaly. Diffuse bilateral interstitial pulmonary opacities. Small bilateral pleural effusions, left greater than right. No pneumothorax. IMPRESSION: Cardiomegaly with interstitial edema and layering effusions. Electronically Signed   By: Lovey Newcomer M.D.   On: 07/07/2019 18:53      Assessment & Plan:    Principal Problem:   Acute on chronic combined systolic and diastolic CHF (congestive heart failure) (HCC) Active Problems:   Essential hypertension   Type 2 diabetes mellitus with neuropathy   Elevated troponin I level   PAF (paroxysmal atrial fibrillation) (HCC)  Acute on chronic CHF (EF 40-45%) Tele Trop I q3h x2 Check cardiac echo Hold off on Metolazone Cont Bidil 1 po bid Cont Kcl 60 meq po qday Lasix 46m iv bid  CAD cath 12/06/2018=> 50-60% distal LAD, 50% prox Lcx, and relatively small RCA prox 50%  Elevated troponin (chronic) Cont Aspirin Cont Crestor 274mpo qhs Check lipid in am   Pafib Cont Eliquis pharmacy to dose  Acute Bronchitis Zithromax 50041mv qday  Copd on home o2 Hold off on Ventolin HFA Cont symbicort-> Dulera 2puff  bid Cont Duoneb 1 neb bid prn   Dm2 Cont Novolog 70/30 10 units [place] bid fsbs ac and qhs, ISS  Diabetic neuropathy Cont Gabapentin 300m61m tid  CKD stage 4 Check cmp in am  Gerd Cont PPI  DVT Prophylaxis-   SCDs   AM Labs Ordered, also please review Full Orders  Family Communication: Admission, patients condition and plan of care including tests being ordered have been discussed with the patient  who indicate understanding and agree with the plan and Code Status.  Code Status:  FULL CODE per patient,  [facility]  Admission status: Observation/: Based on patients clinical presentation and evaluation of above clinical data, I have made determination that patient meets observation criteria at this time.  Pt has acute on chronic CHF, gained about 15 lbs of weight since discharge. Will start him as observation admission but may require inpatient status if not able to diurese enough over the course of the next 1-2 days.  Pt has high risk of clinical decline,  Pt has failed outpatient therapy where he increased his lasix. Orally.    Time spent in minutes : 70 minutes   JameJani Gravel on 07/07/2019 at 11:48 PM

## 2019-07-07 NOTE — ED Provider Notes (Signed)
[facility] EMERGENCY DEPARTMENT Provider Note   CSN: [id number] Arrival date & time: 07/07/19  1726   History   Chief Complaint Chief Complaint  Patient presents with  . Shortness of Breath   HPI Nicholas Caldwell is a 73 y.o. male with past medical history see below who presents for evaluation of shortness of breath and cough. Patient states he has had progressive shortness of breath over the last 10 days. Patient has been admitted to the hospital multiple times for CHF exacerbation. States he is compliant with his Lasix however has been drinking water more than "I should be." Cough is productive of white sputum.  Patient states he has had increased swelling to his bilateral lower extremities into his abdomen. Admits to PND and orthopnea. Compliant with Lasix, no new medication changes. Patient states his swelling has been so severe to his lower extremities they have been using compression ace wraps as he has had occasional weeping to his legs. Has had intermittent chest pain however has none currently. Feels like his weight has been up however patient is unsure of his dry weight.  Denies additional aggravating or alleviating factors. Patient on 3L [place] at baseline.  History obtain from patient and past medical records. No interpreter was used.  Chart review: 06/08/19-8/10 for CHF with AKI on CKD. Admitted to [facility]. Last weight 116.4 on dc    HPI  Past Medical History:  Diagnosis Date  . Atrial flutter ([place])    a. recurrent AFlutter with RVR;  b. Amiodarone Rx started 4/16  . CAD (coronary artery disease)    a. LHC 1/16:  mLAD diffuse disease, pLCx mild disease, dLCx with disease but too small for PCI, RCA ok, EF 25-30%  . Chronic pain   . Chronic systolic CHF (congestive heart failure) ([place])   . COPD (chronic obstructive pulmonary disease) ([place])   . Diabetes mellitus without complication ([place])   . Hypercholesteremia   . Hypertension   . NICM (nonischemic cardiomyopathy)  ([place])    a.dx 2016. b. 2D echo 06/2016 - Last echo 07/01/16: mod dilated LV, mod LVH, EF 25-30%, mild-mod MR, sev LAE, mild-mod reduced RV systolic function, mild-mod TR, PASP 44mHG.  .Marland KitchenPAF (paroxysmal atrial fibrillation) (HCC)    On amio - ot a candidate for flecainide due to cardiomyopathy, not a candidate for Tikosyn due to prolonged QT, and felt to be a poor candidate for ablation given left atrial size.  . Pulmonary hypertension ([place]   . Tobacco abuse     Patient Active Problem List   Diagnosis Date Noted  . Acute CHF (congestive heart failure) (HPaoli 07/07/2019  . Acute on chronic heart failure (HMora 06/09/2019  . Lobar pneumonia (HPulaski 04/14/2019  . Hyperkalemia 04/10/2019  . Cerebral embolism with cerebral infarction 03/21/2019  . Gastritis and gastroduodenitis   . NSVT (nonsustained ventricular tachycardia) (HHolt 03/08/2019  . Tobacco abuse counseling 03/08/2019  . Iron deficiency anemia   . Acute on chronic systolic CHF (congestive heart failure) (HKirklin 03/06/2019  . Diabetes mellitus type 2 in obese ([place]   . Primary osteoarthritis of right hip   . Hypomagnesemia   . Steroid-induced hyperglycemia   . Supplemental oxygen dependent   . Leukocytosis   . Hypokalemia   . Acute on chronic anemia   . Diabetic peripheral neuropathy ([place]   . Acute on chronic systolic (congestive) heart failure (HPower   . Acute on chronic respiratory failure (HKief 01/14/2019  . Hypoxia   . Altered mental  status   . Heart failure ([place]) 12/30/2018  . AKI (acute kidney injury) ([place])   . Acute respiratory failure ([place]) 11/22/2018  . Acute on chronic respiratory failure with hypoxia and hypercapnia ([place]) 11/13/2018  . Metabolic encephalopathy 66/48/3032  . Acute respiratory failure with hypoxia and hypercapnia ([place]) 07/26/2018  . Acute exacerbation of CHF (congestive heart failure) ([place]) 07/26/2018  . CHF exacerbation ([place]) 05/01/2018  . CHF (congestive heart failure) ([place]) 04/30/2018  . Chronic  respiratory failure with hypoxia ([place]) 12/28/2017  . Acute encephalopathy   . Atrial fibrillation with RVR ([place])   . SVT (supraventricular tachycardia) ([place])   . COPD GOLD 0   . Medically noncompliant   . Panlobular emphysema ([place])   . OSA (obstructive sleep apnea)   . Pulmonary hypertension ([place])   . Disorientation   . Pressure injury of skin 09/07/2016  . Acute on chronic combined systolic and diastolic CHF (congestive heart failure) ([place]) 09/05/2016  . Skin lesion-left heal 09/05/2016  . Chronic systolic CHF (congestive heart failure) ([place]) 07/16/2016  . COPD (chronic obstructive pulmonary disease) ([place]) 07/16/2016  . Uncontrolled type 2 diabetes mellitus with complication ([place])   . Diabetic polyneuropathy associated with diabetes mellitus due to underlying condition ([place])   . Normocytic anemia 06/29/2016  . CAD - Non-obstructive by LHC1/16 01/21/2016  . Prolonged QT interval 10/09/2015  . Nonischemic cardiomyopathy ([place]) 10/09/2015  . PAF (paroxysmal atrial fibrillation) ([place])   . Chronic pain 02/19/2015  . Morbid obesity ([place]) 02/13/2015  . Dyspnea   . Elevated troponin I level 11/01/2014  . COPD exacerbation ([place]) 11/01/2014  . Essential hypertension 10/31/2014  . Type 2 diabetes mellitus with neuropathy 10/31/2014  . Hyperlipidemia  10/31/2014  . Cigarette smoker 10/31/2014    Past Surgical History:  Procedure Laterality Date  . BIOPSY  03/11/2019   Procedure: BIOPSY;  Surgeon: Lavena Bullion, DO;  Location: [facility];  Service: Gastroenterology;;  . BIOPSY  03/15/2019   Procedure: BIOPSY;  Surgeon: Irving Copas., MD;  Location: [facility];  Service: Gastroenterology;;  . CARDIOVERSION N/A 07/30/2018   Procedure: CARDIOVERSION;  Surgeon: Larey Dresser, MD;  Location[facility] ENDOSCOPY;  Service: Cardiovascular;  Laterality: N/A;  . COLONOSCOPY N/A 03/11/2019   Procedure: COLONOSCOPY;  Surgeon: Lavena Bullion, DO;  Location: [facility];  Service:  Gastroenterology;  Laterality: N/A;  . ENTEROSCOPY N/A 03/15/2019   Procedure: ENTEROSCOPY;  Surgeon: Rush Landmark Telford Nab., MD;  Location: [facility];  Service: Gastroenterology;  Laterality: N/A;  . ESOPHAGOGASTRODUODENOSCOPY Left 03/11/2019   Procedure: ESOPHAGOGASTRODUODENOSCOPY (EGD);  Surgeon: Lavena Bullion, DO;  Location[facility] ENDOSCOPY;  Service: Gastroenterology;  Laterality: Left;  . LEFT AND RIGHT HEART CATHETERIZATION WITH CORONARY ANGIOGRAM N/A 11/06/2014   Procedure: LEFT AND RIGHT HEART CATHETERIZATION WITH CORONARY ANGIOGRAM;  Surgeon: Jettie Booze, MD;  Location: [facility] CATH LAB;  Service: Cardiovascular;  Laterality: N/A;  . RIGHT/LEFT HEART CATH AND CORONARY ANGIOGRAPHY N/A 12/06/2018   Procedure: RIGHT/LEFT HEART CATH AND CORONARY ANGIOGRAPHY;  Surgeon: Larey Dresser, MD;  Location: [facility] CV LAB;  Service: Cardiovascular;  Laterality: N/A;  . SPLENECTOMY    . SUBMUCOSAL TATTOO INJECTION  03/15/2019   Procedure: SUBMUCOSAL TATTOO INJECTION;  Surgeon: Irving Copas., MD;  Location: [facility];  Service: Gastroenterology;;        Home Medications    Prior to Admission medications   Medication Sig Start Date End Date Taking? Authorizing Provider  OXYGEN Inhale 3 L into the lungs continuous.  Yes [provider]  albuterol (VENTOLIN HFA) 108 (90 Base) MCG/ACT inhaler Inhale 2 puffs into the lungs every 6 (six) hours as needed for wheezing or shortness of breath.    [provider]  apixaban (ELIQUIS) 5 MG TABS tablet Take 1 tablet (5 mg total) by mouth 2 (two) times daily. 03/31/19   Kayleen Memos, DO  aspirin EC 81 MG EC tablet Take 1 tablet (81 mg total) by mouth daily. 06/14/19   Swayze, Ava, DO  budesonide-formoterol (SYMBICORT) 160-4.5 MCG/ACT inhaler Inhale 2 puffs into the lungs as needed (shortness of breath).     [provider]  ferrous sulfate 325 (65 FE) MG tablet Take 325 mg by mouth 2 (two) times daily.     [provider]  fluticasone (FLONASE) 50 MCG/ACT nasal spray Place 2 sprays into both nostrils daily as needed for allergies.     [provider]  gabapentin (NEURONTIN) 300 MG capsule Take 1 capsule (300 mg total) by mouth 2 (two) times daily. 06/13/19   Swayze, Ava, DO  hydrocortisone (ANUSOL-HC) 2.5 % rectal cream Place 1 application rectally 2 (two) times daily as needed for hemorrhoids or itching.     [provider]  hydrocortisone 1 % lotion Apply 1 application topically See admin instructions. Apply small amount to back twice daily for itchy rash as needed    [provider]  insulin aspart (NOVOLOG) 100 UNIT/ML injection Inject 4 Units into the skin 3 (three) times daily with meals. 03/31/19   Kayleen Memos, DO  insulin aspart protamine - aspart (NOVOLOG MIX 70/30 FLEXPEN) (70-30) 100 UNIT/ML FlexPen Inject 0.1 mLs (10 Units total) into the skin 2 (two) times daily. 03/31/19   Kayleen Memos, DO  ipratropium-albuterol (DUONEB) 0.5-2.5 (3) MG/3ML SOLN Take 3 mLs by nebulization 2 (two) times daily as needed (shortness of breath/wheezing).     [provider]  isosorbide-hydrALAZINE (BIDIL) 20-37.5 MG tablet Take 0.5 tablets by mouth 3 (three) times daily. 05/18/19   Larey Dresser, MD  metolazone (ZAROXOLYN) 2.5 MG tablet Take 1 tablet (2.5 mg total) by mouth 4 (four) times a week. Take extra Potassium 18mq when taking this medication 06/30/19   MLarey Dresser MD  oxybutynin (DITROPAN) 5 MG tablet Take 1 tablet (5 mg total) by mouth 4 (four) times daily. 06/13/19   Swayze, Ava, DO  oxyCODONE-acetaminophen (PERCOCET) 10-325 MG tablet Take 1 tablet by mouth every 6 (six) hours as needed for pain.  05/16/19   [provider]  pantoprazole (PROTONIX) 40 MG tablet Take 1 tablet (40 mg total) by mouth 2 (two) times daily. 05/05/19   Clegg, Amy D, NP  polyethylene glycol (MIRALAX / GLYCOLAX) packet Take 17 g by mouth daily. Patient taking  differently: Take 17 g by mouth daily as needed (constipation).  01/24/19   Angiulli, DLavon Paganini PA-C  potassium chloride SA (K-DUR) 20 MEQ tablet Take 3 tablets (60 mEq total) by mouth 2 (two) times daily. With additional 24m when you take Metolazone. 06/13/19   Swayze, Ava, DO  PRESCRIPTION MEDICATION Cpap    [provider]  rosuvastatin (CRESTOR) 20 MG tablet Take 1 tablet (20 mg total) by mouth daily at 6 PM. 05/18/19   McLarey DresserMD  sodium chloride (OCEAN) 0.65 % SOLN nasal spray Place 2 sprays into both nostrils as needed for congestion.     [provider]  tamsulosin (FLOMAX) 0.4 MG CAPS capsule Take 1 capsule (0.4 mg total)  by mouth daily after supper. 04/20/19   Clegg, Amy D, NP  torsemide (DEMADEX) 20 MG tablet Take 4 tablets (80 mg total) by mouth 2 (two) times daily. 06/23/19   Larey Dresser, MD    Family History Family History  Problem Relation Age of Onset  . Heart disease Mother   . Hypertension Mother   . Heart failure Mother   . Heart disease Father   . Hypertension Sister   . Heart attack Neg Hx   . Stroke Neg Hx   . Colon cancer Neg Hx   . Esophageal cancer Neg Hx     Social History Social History   Tobacco Use  . Smoking status: Former Smoker    Packs/day: 1.00    Years: 34.00    Pack years: 34.00    Types: Cigarettes    Quit date: 11/30/2018    Years since quitting: 0.6  . Smokeless tobacco: Never Used  Substance Use Topics  . Alcohol use: No    Alcohol/week: 0.0 standard drinks  . Drug use: No     Allergies   Patient has no known allergies.   Review of Systems Review of Systems  Constitutional: Positive for fatigue (Chronic). Negative for activity change, appetite change, chills and fever.  HENT: Negative.   Respiratory: Positive for cough, shortness of breath and wheezing. Negative for apnea, choking, chest tightness and stridor.   Cardiovascular: Positive for chest pain (intermittent, none currently).       PND,  Orthopnea  Gastrointestinal: Positive for abdominal distention. Negative for abdominal pain, blood in stool, constipation, diarrhea, nausea, rectal pain and vomiting.  Genitourinary: Negative.   Musculoskeletal: Positive for gait problem.  Skin: Negative.   Neurological: Positive for weakness (generalized). Negative for dizziness, syncope, speech difficulty, light-headedness and headaches.  All other systems reviewed and are negative.   Physical Exam Updated Vital Signs BP (!) 111/52   Pulse 73   Temp 98.2 F (36.8 C) (Oral)   Resp (!) 21   Ht 6' (1.829 m)   Wt 128.8 kg   SpO2 99%   BMI 38.52 kg/m   Physical Exam Vitals signs and nursing note reviewed.  Constitutional:      General: He is not in acute distress.    Appearance: He is well-developed. He is not diaphoretic. Ill appearance: Chronically ill appearing.  HENT:     Head: Atraumatic.     Mouth/Throat:     Mouth: Mucous membranes are moist.  Eyes:     Pupils: Pupils are equal, round, and reactive to light.  Neck:     Musculoskeletal: Normal range of motion and neck supple.  Cardiovascular:     Rate and Rhythm: Normal rate and regular rhythm.     Pulses: Normal pulses.          Dorsalis pedis pulses are 2+ on the right side and 2+ on the left side.       Posterior tibial pulses are 2+ on the right side and 2+ on the left side.     Heart sounds: Normal heart sounds.  Pulmonary:     Effort: Pulmonary effort is normal. No respiratory distress.     Comments: Diffuse wheezing in all lung fields with mild rales at bases. Abdominal:     General: There is no distension.     Palpations: Abdomen is soft.     Comments: Protuberant abdomen with well-healed old midline surgical scar.  Nontender to palpation. No Rebound or guarding.  Musculoskeletal:  Normal range of motion.     Right lower leg: Edema present.     Left lower leg: Edema present.     Comments: 2+ lateral pitting edema to feet which extends up into the thighs.   Patient has Ace wraps of bilateral lower extremities which he refuses to have removed.  Tender bilateral posterior calves.  Unable to assess for erythema or warmth secondary to patient's refusal of wraps.  Moves all 4 extremities without difficulty.   Skin:    General: Skin is warm and dry.     Comments: Dried scaling lower extremity digits.  Neurological:     Mental Status: He is alert.     Comments: CN II through XII grossly intact.  No facial droop.    ED Treatments / Results  Labs (all labs ordered are listed, but only abnormal results are displayed) Labs Reviewed  COMPREHENSIVE METABOLIC PANEL - Abnormal; Notable for the following components:      Result Value   Sodium 133 (*)    Chloride 84 (*)    CO2 35 (*)    Glucose, Bld 111 (*)    BUN 60 (*)    Creatinine, Ser 2.40 (*)    Calcium 8.7 (*)    Albumin 3.1 (*)    GFR calc non Af Amer 26 (*)    GFR calc Af Amer 30 (*)    All other components within normal limits  CBC WITH DIFFERENTIAL/PLATELET - Abnormal; Notable for the following components:   RBC 3.82 (*)    Hemoglobin 9.6 (*)    HCT 32.5 (*)    MCH 25.1 (*)    MCHC 29.5 (*)    RDW 17.3 (*)    Lymphs Abs 0.6 (*)    All other components within normal limits  BRAIN NATRIURETIC PEPTIDE - Abnormal; Notable for the following components:   B Natriuretic Peptide 2,211.9 (*)    All other components within normal limits  TROPONIN I (HIGH SENSITIVITY) - Abnormal; Notable for the following components:   Troponin I (High Sensitivity) 37 (*)    All other components within normal limits  TROPONIN I (HIGH SENSITIVITY) - Abnormal; Notable for the following components:   Troponin I (High Sensitivity) 40 (*)    All other components within normal limits  SARS CORONAVIRUS 2 (HOSPITAL ORDER, [facility])  LACTIC ACID, PLASMA  LACTIC ACID, PLASMA  URINALYSIS, ROUTINE W REFLEX MICROSCOPIC  COMPREHENSIVE METABOLIC PANEL  CBC  TROPONIN I (HIGH SENSITIVITY)     EKG EKG Interpretation  Date/Time:  Thursday July 07 2019 17:28:59 EDT Ventricular Rate:  80 PR Interval:    QRS Duration: 118 QT Interval:  416 QTC Calculation: 480 R Axis:   110 Text Interpretation:  Sinus rhythm Borderline prolonged PR interval Probable left atrial enlargement Incomplete right bundle branch block Low voltage, precordial leads no sig change from previoud Confirmed by Charlesetta Shanks [phone]) on 07/07/2019 6:08:47 PM   Radiology Dg Chest Port 1 View  Result Date: 07/07/2019 CLINICAL DATA:  Patient with shortness of breath. EXAM: PORTABLE CHEST 1 VIEW COMPARISON:  Chest radiograph 06/08/2019 FINDINGS: Monitoring leads overlie the patient. Stable cardiomegaly. Diffuse bilateral interstitial pulmonary opacities. Small bilateral pleural effusions, left greater than right. No pneumothorax. IMPRESSION: Cardiomegaly with interstitial edema and layering effusions. Electronically Signed   By: Lovey Newcomer M.D.   On: 07/07/2019 18:53    Procedures Procedures (including critical care time)   ECHO 3/20 with EF 45-50% Medications Ordered in  ED Medications  enoxaparin (LOVENOX) injection 30 mg (has no administration in time range)  sodium chloride flush (NS) 0.9 % injection 3 mL ( Intravenous Canceled Entry 07/07/19 2204)  sodium chloride flush (NS) 0.9 % injection 3 mL (has no administration in time range)  0.9 %  sodium chloride infusion (has no administration in time range)  acetaminophen (TYLENOL) tablet 650 mg (has no administration in time range)    Or  acetaminophen (TYLENOL) suppository 650 mg (has no administration in time range)  insulin aspart (novoLOG) injection 0-9 Units (has no administration in time range)  insulin aspart (novoLOG) injection 0-5 Units (has no administration in time range)  furosemide (LASIX) injection 80 mg (80 mg Intravenous Given 07/07/19 2056)  albuterol (VENTOLIN HFA) 108 (90 Base) MCG/ACT inhaler 6 puff (6 puffs Inhalation Given 07/07/19 2055)     Initial Impression / Assessment and Plan / ED Course  I have reviewed the triage vital signs and the nursing notes.  Pertinent labs & imaging results that were available during my care of the patient were reviewed by me and considered in my medical decision making (see chart for details).  73 year old male appears chronically ill presents for evaluation of shortness of breath.  Patient with lower extremity swelling which is been worsening.  Nontoxic-appearing, afebrile without tachycardia, tachypnea.  Has been trying up his Lasix at home without success.  Admits to PND and orthopnea.  He has diffuse bilateral wheezing in all lung fields.  Appears fluid overloaded.  Troponin at baseline.  EKG without ST/T changes.  Plain film chest with interstitial edema with cardiomegaly, BNP elevated that 2211.9, creatinine 2.4, hemoglobin at baseline.  COVID negative.  Patient try to ambulate to edge of bed and became hypoxic after albuterol and 80 of Lasix.  Will admit to hospitalist for CHF exacerbation. Low suspicion for ACS, PE, dissection as cause of his shortness of breath.  2200: Consulted with Dr. Maudie Mercury with [facility] who will evaluate patient for admission.  Discussed with Dr. Colvin Caroli, attending physician who agrees with above treatment, plan and disposition.     Nicholas Caldwell was evaluated in Emergency Department on 07/07/2019 for the symptoms described in the history of present illness. He was evaluated in the context of the global COVID-19 pandemic, which necessitated consideration that the patient might be at risk for infection with the SARS-CoV-2 virus that causes COVID-19. Institutional protocols and algorithms that pertain to the evaluation of patients at risk for COVID-19 are in a state of rapid change based on information released by regulatory bodies including the CDC and federal and state organizations. These policies and algorithms were followed during the patient's care in the ED.  Final Clinical  Impressions(s) / ED Diagnoses   Final diagnoses:  Acute on chronic congestive heart failure, unspecified heart failure type ([place])  AKI (acute kidney injury) ([place])  Peripheral edema  Blood glucose elevated  Elevated troponin    ED Discharge Orders    None       Dyna Figuereo A, PA-C 07/07/19 2306    Charlesetta Shanks, MD 07/09/19 [phone]

## 2019-07-07 NOTE — ED Notes (Signed)
Attempt made to give report over to [place], this RN was keep on hold by RN of the floor that states he needs to see the pt first, RN on the floor oriented that I have another Pt by EMS on the door and report needs to be given, RN still had me on hold and didn't get report, pt to be transferred to the unit by the NT, CN aware and agree that pt will go upstair at this time with the tech. Floor nurse to call back if has any question. Handoff on the chart for longer than 30 min prior to transfer pt to the floor.

## 2019-07-07 NOTE — ED Notes (Signed)
Pt knows we need urine and urinal at bedside.
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
## 2019-07-07 NOTE — ED Triage Notes (Signed)
Pt presents to the ED with shortness of breath and increased lower extremity/abdomen swelling x 3 days. Pt also reports a productive cough for about 2 weeks. Pt is alert and oriented x4 at present time. 99% on 3L via [place] which he wears all of the time. Abdominal swelling and lower extremity swelling noted upon initial assessment.

## 2019-07-07 NOTE — ED Notes (Signed)
Pt refusing to have the dressings removed from this RN. Pt informed that it was important we assess his lower extremities/skin underneath his dressings. Pt continues to refuse removal of dressings. PA made aware by this RN.

## 2019-07-08 ENCOUNTER — Inpatient Hospital Stay (HOSPITAL_COMMUNITY): Payer: No Typology Code available for payment source

## 2019-07-08 DIAGNOSIS — J441 Chronic obstructive pulmonary disease with (acute) exacerbation: Secondary | ICD-10-CM | POA: Diagnosis not present

## 2019-07-08 DIAGNOSIS — Z7189 Other specified counseling: Secondary | ICD-10-CM | POA: Diagnosis not present

## 2019-07-08 DIAGNOSIS — I878 Other specified disorders of veins: Secondary | ICD-10-CM | POA: Diagnosis present

## 2019-07-08 DIAGNOSIS — Z515 Encounter for palliative care: Secondary | ICD-10-CM | POA: Diagnosis not present

## 2019-07-08 DIAGNOSIS — R42 Dizziness and giddiness: Secondary | ICD-10-CM | POA: Diagnosis not present

## 2019-07-08 DIAGNOSIS — Z794 Long term (current) use of insulin: Secondary | ICD-10-CM

## 2019-07-08 DIAGNOSIS — J9601 Acute respiratory failure with hypoxia: Secondary | ICD-10-CM | POA: Diagnosis not present

## 2019-07-08 DIAGNOSIS — I5043 Acute on chronic combined systolic (congestive) and diastolic (congestive) heart failure: Secondary | ICD-10-CM

## 2019-07-08 DIAGNOSIS — E785 Hyperlipidemia, unspecified: Secondary | ICD-10-CM | POA: Diagnosis present

## 2019-07-08 DIAGNOSIS — J9602 Acute respiratory failure with hypercapnia: Secondary | ICD-10-CM | POA: Diagnosis not present

## 2019-07-08 DIAGNOSIS — N184 Chronic kidney disease, stage 4 (severe): Secondary | ICD-10-CM | POA: Diagnosis not present

## 2019-07-08 DIAGNOSIS — I10 Essential (primary) hypertension: Secondary | ICD-10-CM

## 2019-07-08 DIAGNOSIS — T402X5A Adverse effect of other opioids, initial encounter: Secondary | ICD-10-CM | POA: Diagnosis not present

## 2019-07-08 DIAGNOSIS — I480 Paroxysmal atrial fibrillation: Secondary | ICD-10-CM

## 2019-07-08 DIAGNOSIS — E1140 Type 2 diabetes mellitus with diabetic neuropathy, unspecified: Secondary | ICD-10-CM | POA: Diagnosis not present

## 2019-07-08 DIAGNOSIS — J9622 Acute and chronic respiratory failure with hypercapnia: Secondary | ICD-10-CM | POA: Diagnosis not present

## 2019-07-08 DIAGNOSIS — N179 Acute kidney failure, unspecified: Secondary | ICD-10-CM | POA: Diagnosis not present

## 2019-07-08 DIAGNOSIS — Z9981 Dependence on supplemental oxygen: Secondary | ICD-10-CM | POA: Diagnosis not present

## 2019-07-08 DIAGNOSIS — L0390 Cellulitis, unspecified: Secondary | ICD-10-CM | POA: Diagnosis not present

## 2019-07-08 DIAGNOSIS — J9621 Acute and chronic respiratory failure with hypoxia: Secondary | ICD-10-CM | POA: Diagnosis not present

## 2019-07-08 DIAGNOSIS — Z6838 Body mass index (BMI) 38.0-38.9, adult: Secondary | ICD-10-CM | POA: Diagnosis not present

## 2019-07-08 DIAGNOSIS — R609 Edema, unspecified: Secondary | ICD-10-CM | POA: Diagnosis not present

## 2019-07-08 DIAGNOSIS — R7989 Other specified abnormal findings of blood chemistry: Secondary | ICD-10-CM | POA: Diagnosis not present

## 2019-07-08 DIAGNOSIS — I130 Hypertensive heart and chronic kidney disease with heart failure and stage 1 through stage 4 chronic kidney disease, or unspecified chronic kidney disease: Secondary | ICD-10-CM | POA: Diagnosis not present

## 2019-07-08 DIAGNOSIS — Z993 Dependence on wheelchair: Secondary | ICD-10-CM | POA: Diagnosis not present

## 2019-07-08 DIAGNOSIS — E1142 Type 2 diabetes mellitus with diabetic polyneuropathy: Secondary | ICD-10-CM | POA: Diagnosis present

## 2019-07-08 DIAGNOSIS — E1152 Type 2 diabetes mellitus with diabetic peripheral angiopathy with gangrene: Secondary | ICD-10-CM | POA: Diagnosis not present

## 2019-07-08 DIAGNOSIS — I361 Nonrheumatic tricuspid (valve) insufficiency: Secondary | ICD-10-CM | POA: Diagnosis not present

## 2019-07-08 DIAGNOSIS — J90 Pleural effusion, not elsewhere classified: Secondary | ICD-10-CM | POA: Diagnosis not present

## 2019-07-08 DIAGNOSIS — I472 Ventricular tachycardia: Secondary | ICD-10-CM | POA: Diagnosis not present

## 2019-07-08 DIAGNOSIS — F1120 Opioid dependence, uncomplicated: Secondary | ICD-10-CM | POA: Diagnosis not present

## 2019-07-08 DIAGNOSIS — I96 Gangrene, not elsewhere classified: Secondary | ICD-10-CM | POA: Diagnosis not present

## 2019-07-08 DIAGNOSIS — G4733 Obstructive sleep apnea (adult) (pediatric): Secondary | ICD-10-CM | POA: Diagnosis not present

## 2019-07-08 DIAGNOSIS — Z20828 Contact with and (suspected) exposure to other viral communicable diseases: Secondary | ICD-10-CM | POA: Diagnosis not present

## 2019-07-08 DIAGNOSIS — E662 Morbid (severe) obesity with alveolar hypoventilation: Secondary | ICD-10-CM | POA: Diagnosis not present

## 2019-07-08 DIAGNOSIS — R5383 Other fatigue: Secondary | ICD-10-CM | POA: Diagnosis not present

## 2019-07-08 DIAGNOSIS — I2510 Atherosclerotic heart disease of native coronary artery without angina pectoris: Secondary | ICD-10-CM | POA: Diagnosis present

## 2019-07-08 DIAGNOSIS — G8929 Other chronic pain: Secondary | ICD-10-CM | POA: Diagnosis present

## 2019-07-08 LAB — GLUCOSE, CAPILLARY
Glucose-Capillary: 137 mg/dL — ABNORMAL HIGH (ref 70–99)
Glucose-Capillary: 159 mg/dL — ABNORMAL HIGH (ref 70–99)
Glucose-Capillary: 116 mg/dL — ABNORMAL HIGH (ref 70–99)
Glucose-Capillary: 106 mg/dL — ABNORMAL HIGH (ref 70–99)

## 2019-07-08 LAB — COMPREHENSIVE METABOLIC PANEL
ALT: 10 U/L (ref 0–44)
AST: 16 U/L (ref 15–41)
Albumin: 2.8 g/dL — ABNORMAL LOW (ref 3.5–5.0)
Alkaline Phosphatase: 86 U/L (ref 38–126)
Anion gap: 11 (ref 5–15)
BUN: 62 mg/dL — ABNORMAL HIGH (ref 8–23)
CO2: 39 mmol/L — ABNORMAL HIGH (ref 22–32)
Calcium: 8.6 mg/dL — ABNORMAL LOW (ref 8.9–10.3)
Chloride: 86 mmol/L — ABNORMAL LOW (ref 98–111)
Creatinine, Ser: 2.45 mg/dL — ABNORMAL HIGH (ref 0.61–1.24)
GFR calc Af Amer: 30 mL/min — ABNORMAL LOW (ref >60)
GFR calc non Af Amer: 26 mL/min — ABNORMAL LOW (ref >60)
Glucose, Bld: 149 mg/dL — ABNORMAL HIGH (ref 70–99)
Potassium: 3.8 mmol/L (ref 3.5–5.1)
Sodium: 136 mmol/L (ref 135–145)
Total Bilirubin: 0.5 mg/dL (ref 0.3–1.2)
Total Protein: 6.4 g/dL — ABNORMAL LOW (ref 6.5–8.1)

## 2019-07-08 LAB — LIPID PANEL
Cholesterol: 126 mg/dL (ref 0–200)
HDL: 52 mg/dL (ref >40)
LDL Cholesterol: 52 mg/dL (ref 0–99)
Total CHOL/HDL Ratio: 2.4 RATIO
Triglycerides: 110 mg/dL (ref <150)
VLDL: 22 mg/dL (ref 0–40)

## 2019-07-08 LAB — URINALYSIS, ROUTINE W REFLEX MICROSCOPIC
Bilirubin Urine: NEGATIVE
Glucose, UA: NEGATIVE mg/dL
Hgb urine dipstick: NEGATIVE
Ketones, ur: NEGATIVE mg/dL
Leukocytes,Ua: NEGATIVE
Nitrite: NEGATIVE
Protein, ur: NEGATIVE mg/dL
Specific Gravity, Urine: 1.006 (ref 1.005–1.030)
pH: 6 (ref 5.0–8.0)

## 2019-07-08 LAB — CBC
HCT: 30.4 % — ABNORMAL LOW (ref 39.0–52.0)
Hemoglobin: 9.1 g/dL — ABNORMAL LOW (ref 13.0–17.0)
MCH: 25.4 pg — ABNORMAL LOW (ref 26.0–34.0)
MCHC: 29.9 g/dL — ABNORMAL LOW (ref 30.0–36.0)
MCV: 84.9 fL (ref 80.0–100.0)
Platelets: 346 10*3/uL (ref 150–400)
RBC: 3.58 MIL/uL — ABNORMAL LOW (ref 4.22–5.81)
RDW: 17.2 % — ABNORMAL HIGH (ref 11.5–15.5)
WBC: 5.7 10*3/uL (ref 4.0–10.5)
nRBC: 0 % (ref 0.0–0.2)

## 2019-07-08 LAB — ECHOCARDIOGRAM COMPLETE
Height: 72 in
Weight: 4529.13 oz

## 2019-07-08 LAB — TROPONIN I (HIGH SENSITIVITY): Troponin I (High Sensitivity): 45 ng/L — ABNORMAL HIGH (ref <18)

## 2019-07-08 MED ORDER — LEVALBUTEROL HCL 0.63 MG/3ML IN NEBU
0.6300 mg | INHALATION_SOLUTION | Freq: Four times a day (QID) | RESPIRATORY_TRACT | Status: DC
  Administered 2019-07-08 – 2019-07-10 (×9): 0.63 mg via RESPIRATORY_TRACT
  Filled 2019-07-08 (×9): qty 3

## 2019-07-08 MED ORDER — GABAPENTIN 300 MG PO CAPS
300.0000 mg | ORAL_CAPSULE | Freq: Two times a day (BID) | ORAL | Status: DC
  Administered 2019-07-08 – 2019-07-22 (×29): 300 mg via ORAL
  Filled 2019-07-08 (×29): qty 1

## 2019-07-08 MED ORDER — ROSUVASTATIN CALCIUM 20 MG PO TABS
20.0000 mg | ORAL_TABLET | Freq: Every day | ORAL | Status: DC
  Administered 2019-07-08 – 2019-07-21 (×13): 20 mg via ORAL
  Filled 2019-07-08 (×13): qty 1

## 2019-07-08 MED ORDER — IPRATROPIUM-ALBUTEROL 0.5-2.5 (3) MG/3ML IN SOLN
3.0000 mL | Freq: Two times a day (BID) | RESPIRATORY_TRACT | Status: DC | PRN
  Administered 2019-07-08: 01:00:00 3 mL via RESPIRATORY_TRACT
  Filled 2019-07-08: qty 3

## 2019-07-08 MED ORDER — MOMETASONE FURO-FORMOTEROL FUM 200-5 MCG/ACT IN AERO
2.0000 | INHALATION_SPRAY | Freq: Two times a day (BID) | RESPIRATORY_TRACT | Status: DC
  Administered 2019-07-08 – 2019-07-22 (×30): 2 via RESPIRATORY_TRACT
  Filled 2019-07-08: qty 8.8

## 2019-07-08 MED ORDER — ISOSORB DINITRATE-HYDRALAZINE 20-37.5 MG PO TABS
0.5000 | ORAL_TABLET | Freq: Three times a day (TID) | ORAL | Status: DC
  Administered 2019-07-08 – 2019-07-09 (×3): 0.5 via ORAL
  Filled 2019-07-08 (×4): qty 1

## 2019-07-08 MED ORDER — APIXABAN 5 MG PO TABS
5.0000 mg | ORAL_TABLET | Freq: Two times a day (BID) | ORAL | Status: DC
  Administered 2019-07-08 – 2019-07-22 (×28): 5 mg via ORAL
  Filled 2019-07-08 (×28): qty 1

## 2019-07-08 MED ORDER — ALUM & MAG HYDROXIDE-SIMETH 200-200-20 MG/5ML PO SUSP
15.0000 mL | Freq: Once | ORAL | Status: AC
  Administered 2019-07-08: 08:00:00 15 mL via ORAL
  Filled 2019-07-08: qty 30

## 2019-07-08 MED ORDER — FERROUS SULFATE 325 (65 FE) MG PO TABS
325.0000 mg | ORAL_TABLET | Freq: Two times a day (BID) | ORAL | Status: DC
  Administered 2019-07-08 – 2019-07-22 (×29): 325 mg via ORAL
  Filled 2019-07-08 (×29): qty 1

## 2019-07-08 MED ORDER — TAMSULOSIN HCL 0.4 MG PO CAPS
0.4000 mg | ORAL_CAPSULE | Freq: Every day | ORAL | Status: DC
  Administered 2019-07-08 – 2019-07-21 (×13): 0.4 mg via ORAL
  Filled 2019-07-08 (×13): qty 1

## 2019-07-08 MED ORDER — OXYBUTYNIN CHLORIDE 5 MG PO TABS
5.0000 mg | ORAL_TABLET | Freq: Four times a day (QID) | ORAL | Status: DC
  Administered 2019-07-08 – 2019-07-22 (×53): 5 mg via ORAL
  Filled 2019-07-08 (×61): qty 1

## 2019-07-08 MED ORDER — BUDESONIDE 0.25 MG/2ML IN SUSP
0.2500 mg | Freq: Two times a day (BID) | RESPIRATORY_TRACT | Status: DC
  Administered 2019-07-08 – 2019-07-18 (×21): 0.25 mg via RESPIRATORY_TRACT
  Filled 2019-07-08 (×22): qty 2

## 2019-07-08 MED ORDER — GUAIFENESIN ER 600 MG PO TB12
600.0000 mg | ORAL_TABLET | Freq: Two times a day (BID) | ORAL | Status: DC
  Administered 2019-07-08 – 2019-07-22 (×28): 600 mg via ORAL
  Filled 2019-07-08 (×29): qty 1

## 2019-07-08 MED ORDER — FUROSEMIDE 10 MG/ML IJ SOLN
80.0000 mg | Freq: Two times a day (BID) | INTRAMUSCULAR | Status: DC
  Administered 2019-07-08 – 2019-07-19 (×24): 80 mg via INTRAVENOUS
  Filled 2019-07-08 (×24): qty 8

## 2019-07-08 MED ORDER — ASPIRIN 81 MG PO CHEW
81.0000 mg | CHEWABLE_TABLET | Freq: Every day | ORAL | Status: DC
  Administered 2019-07-08 – 2019-07-22 (×15): 81 mg via ORAL
  Filled 2019-07-08 (×15): qty 1

## 2019-07-08 MED ORDER — POLYETHYLENE GLYCOL 3350 17 G PO PACK: 17.0000 g | PACK | Freq: Every day | ORAL | Status: DC | PRN

## 2019-07-08 MED ORDER — CARVEDILOL 3.125 MG PO TABS
3.1250 mg | ORAL_TABLET | Freq: Two times a day (BID) | ORAL | Status: DC
  Administered 2019-07-08 – 2019-07-09 (×2): 3.125 mg via ORAL
  Filled 2019-07-08 (×2): qty 1

## 2019-07-08 MED ORDER — POTASSIUM CHLORIDE CRYS ER 20 MEQ PO TBCR
60.0000 meq | EXTENDED_RELEASE_TABLET | Freq: Two times a day (BID) | ORAL | Status: DC
  Administered 2019-07-08 – 2019-07-09 (×3): 60 meq via ORAL
  Filled 2019-07-08 (×3): qty 3

## 2019-07-08 MED ORDER — IPRATROPIUM BROMIDE 0.02 % IN SOLN
0.5000 mg | Freq: Four times a day (QID) | RESPIRATORY_TRACT | Status: DC
  Administered 2019-07-08 – 2019-07-10 (×9): 0.5 mg via RESPIRATORY_TRACT
  Filled 2019-07-08 (×9): qty 2.5

## 2019-07-08 MED ORDER — FLUTICASONE PROPIONATE 50 MCG/ACT NA SUSP
2.0000 | Freq: Every day | NASAL | Status: DC | PRN
  Filled 2019-07-08: qty 16

## 2019-07-08 MED ORDER — PANTOPRAZOLE SODIUM 40 MG PO TBEC
40.0000 mg | DELAYED_RELEASE_TABLET | Freq: Two times a day (BID) | ORAL | Status: DC
  Administered 2019-07-08 – 2019-07-22 (×29): 40 mg via ORAL
  Filled 2019-07-08 (×29): qty 1

## 2019-07-08 MED ORDER — INSULIN ASPART PROT & ASPART (70-30 MIX) 100 UNIT/ML ~~LOC~~ SUSP
10.0000 [IU] | Freq: Two times a day (BID) | SUBCUTANEOUS | Status: DC
  Administered 2019-07-08 – 2019-07-09 (×3): 10 [IU] via SUBCUTANEOUS
  Filled 2019-07-08 (×2): qty 10

## 2019-07-08 MED ORDER — SALINE SPRAY 0.65 % NA SOLN
2.0000 | NASAL | Status: DC | PRN
  Administered 2019-07-15 – 2019-07-16 (×2): 2 via NASAL
  Filled 2019-07-08: qty 44

## 2019-07-08 NOTE — Progress Notes (Signed)
Pt had 8 beat run of V-tach per tele. Asymptomatic with no respiratory distress. Paged MD

## 2019-07-08 NOTE — Progress Notes (Signed)
ANTICOAGULATION CONSULT NOTE - Initial Consult  Pharmacy Consult for eliquis Indication: atrial fibrillation  No Known Allergies  Patient Measurements: Height: 6' (182.9 cm) Weight: 279 lb 12.2 oz (126.9 kg) IBW/kg (Calculated) : 77.6   Vital Signs: Temp: 98.6 F (37 C) (09/03 2318) Temp Source: Oral (09/03 2318) BP: 107/59 (09/03 2318) Pulse Rate: 83 (09/03 2318)  Labs: Recent Labs    07/07/19 1803 07/07/19 1951  HGB 9.6*  --   HCT 32.5*  --   PLT 355  --   CREATININE 2.40*  --   TROPONINIHS 37* 40*    Estimated Creatinine Clearance: 38.9 mL/min (A) (by C-G formula based on SCr of 2.4 mg/dL (H)).   Medical History: Past Medical History:  Diagnosis Date  . Atrial flutter ([place])    a. recurrent AFlutter with RVR;  b. Amiodarone Rx started 4/16  . CAD (coronary artery disease)    a. LHC 1/16:  mLAD diffuse disease, pLCx mild disease, dLCx with disease but too small for PCI, RCA ok, EF 25-30%  . Chronic pain   . Chronic systolic CHF (congestive heart failure) ([place])   . COPD (chronic obstructive pulmonary disease) ([place])   . Diabetes mellitus without complication ([place])   . Hypercholesteremia   . Hypertension   . NICM (nonischemic cardiomyopathy) ([place])    a.dx 2016. b. 2D echo 06/2016 - Last echo 07/01/16: mod dilated LV, mod LVH, EF 25-30%, mild-mod MR, sev LAE, mild-mod reduced RV systolic function, mild-mod TR, PASP 82mHG.  .Marland KitchenPAF (paroxysmal atrial fibrillation) (HCC)    On amio - ot a candidate for flecainide due to cardiomyopathy, not a candidate for Tikosyn due to prolonged QT, and felt to be a poor candidate for ablation given left atrial size.  . Pulmonary [place] ([place]   . Tobacco abuse     Medications:  See medication history  Assessment: 73yo man to continue eliquis for afib.  Hg 9.6, PTLC 355 Goal of Therapy:  Therapeutic anticoagulation Monitor platelets by anticoagulation protocol: Yes   Plan:  Eliquis 536mpo bid Monitor for bleeding  complications  SeExcell Seltzeroteet 07/08/2019,12:22 AM

## 2019-07-08 NOTE — Progress Notes (Signed)
PROGRESS NOTE    Nicholas Caldwell   BUL:[id number]  DOB: [birth year]  DOA: 07/07/2019 PCP: [facility]   Brief Narrative:  Nicholas Caldwell   is a 73 y.o. male,  w hypertension, hyperlipidemia, Pafib, CAD, CHF (EF 40-45%) , Dm2, w neuropathy, Copd on home o2 3L [place], ? OSA, w recent admission 06/10/2019 for acute on chronic respiratory failure secondary to CHF,  apparently c/o dyspnea for the past 3 week, as well as weight gain of about 15lbs per pt. His breathing has been worse for the past 3 days. Pulse ox was 74% on room air.   He was last admitted from 8/5-8/10 for CHF exacerbation.   Subjective: Still short of breath.     Assessment & Plan:   Principal Problem:   Acute on chronic combined systolic and diastolic CHF and RV failure  - last weight was 116 kg on 8/10- now is 128 kg - on Demadex at home which was "not working"- he will need a higher dose when discharged again - cont to diurese with IV Lasix until dyspnea resolves - EF 40-45 %, diffuse LV hypokinesis, RV was severely enlarged  Active Problems:  Hypotension  - BP in low 100s- follow closely while diuresing  PAF - Was seen in past by Dr. Rayann Heman, not thought to be good ablation candidate. He has not been on Tikosyn or amiodarone due to history of long QT.  - Continue Eliquis.  - in sinus rhythm with short episodes of v tach  V tach - short runs of v tach today- QTc now is 480- start a low dose B blocker    COPD Severe, on 3L home oxygen.   OHS/OSA - will order CPAP at home  Chronic kidney disease stage III-IV - follow with diuresis-      Type 2 diabetes mellitus with neuropathy - cont 70/30 - sugars controlled    Elevated troponin I level - mildly elevated troponin at 30-49 range  Time spent in minutes: 35 DVT prophylaxis: Eliquis Code Status: Full code Family Communication:  Disposition Plan: home Consultants:   none Procedures:   none Antimicrobials:  Anti-infectives (From admission,  onward)   None       Objective: Vitals:   07/08/19 0631 07/08/19 0934 07/08/19 1123 07/08/19 1158  BP: (!) 101/52 (!) 100/51 91/63 (!) 95/59  Pulse: 77 70 78 80  Resp: (!) 22  18   Temp: 98.6 F (37 C)  98.6 F (37 C)   TempSrc: Axillary  Oral   SpO2: 96%  94%   Weight: 128.4 kg     Height:        Intake/Output Summary (Last 24 hours) at 07/08/2019 1414 Last data filed at 07/08/2019 0900 Gross per 24 hour  Intake 480 ml  Output 400 ml  Net 80 ml   Filed Weights   07/07/19 1730 07/07/19 2318 07/08/19 0631  Weight: 128.8 kg 126.9 kg 128.4 kg    Examination: General exam: Appears comfortable  HEENT: PERRLA, oral mucosa moist, no sclera icterus or thrush Respiratory system: Clear to auscultation. Respiratory effort normal. Cardiovascular system: S1 & S2 heard, RRR.   Gastrointestinal system: Abdomen soft, non-tender, nondistended. Normal bowel sounds. Central nervous system: Alert and oriented. No focal neurological deficits. Extremities: No cyanosis, clubbing or edema Skin: No rashes or ulcers Psychiatry:  Mood & affect appropriate.     Data Reviewed: I have personally reviewed following labs and imaging studies  CBC: Recent Labs  Lab 07/07/19 1803  07/08/19 0530  WBC 5.6 5.7  NEUTROABS 4.4  --   HGB 9.6* 9.1*  HCT 32.5* 30.4*  MCV 85.1 84.9  PLT 355 939   Basic Metabolic Panel: Recent Labs  Lab 07/07/19 1803 07/08/19 0530  NA 133* 136  K 4.0 3.8  CL 84* 86*  CO2 35* 39*  GLUCOSE 111* 149*  BUN 60* 62*  CREATININE 2.40* 2.45*  CALCIUM 8.7* 8.6*   GFR: Estimated Creatinine Clearance: 38.3 mL/min (A) (by C-G formula based on SCr of 2.45 mg/dL (H)). Liver Function Tests: Recent Labs  Lab 07/07/19 1803 07/08/19 0530  AST 23 16  ALT 12 10  ALKPHOS 91 86  BILITOT 0.3 0.5  PROT 6.9 6.4*  ALBUMIN 3.1* 2.8*   No results for input(s): LIPASE, AMYLASE in the last 168 hours. No results for input(s): AMMONIA in the last 168 hours. Coagulation  Profile: No results for input(s): INR, PROTIME in the last 168 hours. Cardiac Enzymes: No results for input(s): CKTOTAL, CKMB, CKMBINDEX, TROPONINI in the last 168 hours. BNP (last 3 results) No results for input(s): PROBNP in the last 8760 hours. HbA1C: No results for input(s): HGBA1C in the last 72 hours. CBG: Recent Labs  Lab 07/07/19 2342 07/08/19 0622 07/08/19 1119  GLUCAP 144* 137* 159*   Lipid Profile: Recent Labs    07/08/19 0530  CHOL 126  HDL 52  LDLCALC 52  TRIG 110  CHOLHDL 2.4   Thyroid Function Tests: No results for input(s): TSH, T4TOTAL, FREET4, T3FREE, THYROIDAB in the last 72 hours. Anemia Panel: No results for input(s): VITAMINB12, FOLATE, FERRITIN, TIBC, IRON, RETICCTPCT in the last 72 hours. Urine analysis:    Component Value Date/Time   COLORURINE STRAW (A) 07/08/2019 0008   APPEARANCEUR CLEAR 07/08/2019 0008   LABSPEC 1.006 07/08/2019 0008   PHURINE 6.0 07/08/2019 0008   GLUCOSEU NEGATIVE 07/08/2019 0008   HGBUR NEGATIVE 07/08/2019 0008   BILIRUBINUR NEGATIVE 07/08/2019 0008   KETONESUR NEGATIVE 07/08/2019 0008   PROTEINUR NEGATIVE 07/08/2019 0008   UROBILINOGEN 0.2 10/26/2014 2206   NITRITE NEGATIVE 07/08/2019 0008   LEUKOCYTESUR NEGATIVE 07/08/2019 0008   Sepsis Labs: _0 (procalcitonin:4,lacticidven:4) ) Recent Results (from the past 240 hour(s))  SARS Coronavirus 2 [facility] order, Performed in [facility] hospital lab) Nasopharyngeal Nasopharyngeal Swab     Status: None   Collection Time: 07/07/19  6:07 PM   Specimen: Nasopharyngeal Swab  Result Value Ref Range Status   SARS Coronavirus 2 NEGATIVE NEGATIVE Final    Comment: (NOTE) If result is NEGATIVE SARS-CoV-2 target nucleic acids are NOT DETECTED. The SARS-CoV-2 RNA is generally detectable in upper and lower  respiratory specimens during the acute phase of infection. The lowest  concentration of SARS-CoV-2 viral copies this assay can detect is 250  copies / mL. A  negative result does not preclude SARS-CoV-2 infection  and should not be used as the sole basis for treatment or other  patient management decisions.  A negative result may occur with  improper specimen collection / handling, submission of specimen other  than nasopharyngeal swab, presence of viral mutation(s) within the  areas targeted by this assay, and inadequate number of viral copies  (<250 copies / mL). A negative result must be combined with clinical  observations, patient history, and epidemiological information. If result is POSITIVE SARS-CoV-2 target nucleic acids are DETECTED. The SARS-CoV-2 RNA is generally detectable in upper and lower  respiratory specimens dur ing the acute phase of infection.  Positive  results are indicative of  active infection with SARS-CoV-2.  Clinical  correlation with patient history and other diagnostic information is  necessary to determine patient infection status.  Positive results do  not rule out bacterial infection or co-infection with other viruses. If result is PRESUMPTIVE POSTIVE SARS-CoV-2 nucleic acids MAY BE PRESENT.   A presumptive positive result was obtained on the submitted specimen  and confirmed on repeat testing.  While 2019 novel coronavirus  (SARS-CoV-2) nucleic acids may be present in the submitted sample  additional confirmatory testing may be necessary for epidemiological  and / or clinical management purposes  to differentiate between  SARS-CoV-2 and other Sarbecovirus currently known to infect humans.  If clinically indicated additional testing with an alternate test  methodology [phone]) is advised. The SARS-CoV-2 RNA is generally  detectable in upper and lower respiratory sp ecimens during the acute  phase of infection. The expected result is Negative. Fact Sheet for Patients:  StrictlyIdeas.no Fact Sheet for Healthcare Providers: BankingDealers.co.za This test is not  yet approved or cleared by the [place] FDA and has been authorized for detection and/or diagnosis of SARS-CoV-2 by FDA under an Emergency Use Authorization (EUA).  This EUA will remain in effect (meaning this test can be used) for the duration of the COVID-19 declaration under Section 564(b)(1) of the Act, 21 U.S.C. section 360bbb-3(b)(1), unless the authorization is terminated or revoked sooner. Performed at [facility], [place] [address], [place], [place] [address]          Radiology Studies: Dg Chest Port 1 View  Result Date: 07/07/2019 CLINICAL DATA:  Patient with shortness of breath. EXAM: PORTABLE CHEST 1 VIEW COMPARISON:  Chest radiograph 06/08/2019 FINDINGS: Monitoring leads overlie the patient. Stable cardiomegaly. Diffuse bilateral interstitial pulmonary opacities. Small bilateral pleural effusions, left greater than right. No pneumothorax. IMPRESSION: Cardiomegaly with interstitial edema and layering effusions. Electronically Signed   By: Lovey Newcomer M.D.   On: 07/07/2019 18:53      Scheduled Meds: . apixaban  5 mg Oral BID  . aspirin  81 mg Oral Daily  . budesonide (PULMICORT) nebulizer solution  0.25 mg Nebulization BID  . ferrous sulfate  325 mg Oral BID  . furosemide  80 mg Intravenous BID  . gabapentin  300 mg Oral BID  . guaiFENesin  600 mg Oral BID  . insulin aspart  0-5 Units Subcutaneous QHS  . insulin aspart  0-9 Units Subcutaneous TID WC  . insulin aspart protamine- aspart  10 Units Subcutaneous BID WC  . ipratropium  0.5 mg Nebulization Q6H  . isosorbide-hydrALAZINE  0.5 tablet Oral TID  . levalbuterol  0.63 mg Nebulization Q6H  . mometasone-formoterol  2 puff Inhalation BID  . oxybutynin  5 mg Oral QID  . pantoprazole  40 mg Oral BID  . potassium chloride SA  60 mEq Oral BID  . rosuvastatin  20 mg Oral q1800  . sodium chloride flush  3 mL Intravenous Q12H  . tamsulosin  0.4 mg Oral QPC supper   Continuous Infusions: . sodium chloride        LOS: 0 days      Debbe Odea, MD [facility] Pager: [email or web address] Password [email or web address] 07/08/2019, 2:14 PM

## 2019-07-08 NOTE — Progress Notes (Signed)
PT Cancellation Note  Patient Details Name: Janari Yamada MRN: [id number] DOB: 1945-11-21   Cancelled Treatment:    Reason Eval/Treat Not Completed: Patient declined, no reason specified- does not want to get up today. Will re-attempt tomorrow.    Angline Schweigert 07/08/2019, 2:50 PM

## 2019-07-08 NOTE — Progress Notes (Signed)
  Echocardiogram 2D Echocardiogram has been performed.  Nicholas Caldwell 07/08/2019, 4:48 PM

## 2019-07-09 ENCOUNTER — Encounter (HOSPITAL_COMMUNITY): Payer: Self-pay

## 2019-07-09 DIAGNOSIS — R7989 Other specified abnormal findings of blood chemistry: Secondary | ICD-10-CM

## 2019-07-09 DIAGNOSIS — J9602 Acute respiratory failure with hypercapnia: Secondary | ICD-10-CM

## 2019-07-09 DIAGNOSIS — J9601 Acute respiratory failure with hypoxia: Secondary | ICD-10-CM

## 2019-07-09 DIAGNOSIS — R609 Edema, unspecified: Secondary | ICD-10-CM

## 2019-07-09 DIAGNOSIS — N179 Acute kidney failure, unspecified: Secondary | ICD-10-CM

## 2019-07-09 LAB — BLOOD GAS, ARTERIAL
Acid-Base Excess: 11.4 mmol/L — ABNORMAL HIGH (ref 0.0–2.0)
Acid-Base Excess: 13 mmol/L — ABNORMAL HIGH (ref 0.0–2.0)
Acid-Base Excess: 12.2 mmol/L — ABNORMAL HIGH (ref 0.0–2.0)
Bicarbonate: 39.6 mmol/L — ABNORMAL HIGH (ref 20.0–28.0)
Bicarbonate: 40.1 mmol/L — ABNORMAL HIGH (ref 20.0–28.0)
Bicarbonate: 39.7 mmol/L — ABNORMAL HIGH (ref 20.0–28.0)
Delivery systems: POSITIVE
Delivery systems: POSITIVE
Drawn by: 519031
Drawn by: 365271
Drawn by: 55062
Expiratory PAP: 6
Expiratory PAP: 6
FIO2: 40
FIO2: 0.6
Inspiratory PAP: 14
Inspiratory PAP: 14
O2 Content: 5 L/min
O2 Saturation: 91.2 %
O2 Saturation: 92.8 %
O2 Saturation: 94.4 %
Patient temperature: 98.6
Patient temperature: 98.6
Patient temperature: 98.2
RATE: 16 resp/min
pCO2 arterial: 108 mmHg (ref 32.0–48.0)
pCO2 arterial: 89 mmHg (ref 32.0–48.0)
pCO2 arterial: 94.2 mmHg (ref 32.0–48.0)
pH, Arterial: 7.19 — CL (ref 7.350–7.450)
pH, Arterial: 7.276 — ABNORMAL LOW (ref 7.350–7.450)
pH, Arterial: 7.246 — ABNORMAL LOW (ref 7.350–7.450)
pO2, Arterial: 73.8 mmHg — ABNORMAL LOW (ref 83.0–108.0)
pO2, Arterial: 70.1 mmHg — ABNORMAL LOW (ref 83.0–108.0)
pO2, Arterial: 80.6 mmHg — ABNORMAL LOW (ref 83.0–108.0)

## 2019-07-09 LAB — GLUCOSE, CAPILLARY
Glucose-Capillary: 142 mg/dL — ABNORMAL HIGH (ref 70–99)
Glucose-Capillary: 158 mg/dL — ABNORMAL HIGH (ref 70–99)
Glucose-Capillary: 149 mg/dL — ABNORMAL HIGH (ref 70–99)
Glucose-Capillary: 121 mg/dL — ABNORMAL HIGH (ref 70–99)
Glucose-Capillary: 119 mg/dL — ABNORMAL HIGH (ref 70–99)

## 2019-07-09 LAB — BASIC METABOLIC PANEL
Anion gap: 14 (ref 5–15)
BUN: 74 mg/dL — ABNORMAL HIGH (ref 8–23)
CO2: 34 mmol/L — ABNORMAL HIGH (ref 22–32)
Calcium: 8.6 mg/dL — ABNORMAL LOW (ref 8.9–10.3)
Chloride: 85 mmol/L — ABNORMAL LOW (ref 98–111)
Creatinine, Ser: 2.69 mg/dL — ABNORMAL HIGH (ref 0.61–1.24)
GFR calc Af Amer: 26 mL/min — ABNORMAL LOW (ref >60)
GFR calc non Af Amer: 23 mL/min — ABNORMAL LOW (ref >60)
Glucose, Bld: 140 mg/dL — ABNORMAL HIGH (ref 70–99)
Potassium: 5.7 mmol/L — ABNORMAL HIGH (ref 3.5–5.1)
Sodium: 133 mmol/L — ABNORMAL LOW (ref 135–145)

## 2019-07-09 MED ORDER — CHLORHEXIDINE GLUCONATE 0.12 % MT SOLN
15.0000 mL | Freq: Two times a day (BID) | OROMUCOSAL | Status: DC
  Administered 2019-07-10 – 2019-07-21 (×20): 15 mL via OROMUCOSAL
  Filled 2019-07-09 (×21): qty 15

## 2019-07-09 MED ORDER — ORAL CARE MOUTH RINSE
15.0000 mL | Freq: Two times a day (BID) | OROMUCOSAL | Status: DC
  Administered 2019-07-10 – 2019-07-20 (×18): 15 mL via OROMUCOSAL

## 2019-07-09 MED ORDER — NALOXONE HCL 0.4 MG/ML IJ SOLN: 0.4000 mg | Freq: Once | INTRAMUSCULAR | Status: DC

## 2019-07-09 MED ORDER — NALOXONE HCL 0.4 MG/ML IJ SOLN
INTRAMUSCULAR | Status: AC
  Administered 2019-07-09: 0.4 mg
  Filled 2019-07-09: qty 1

## 2019-07-09 MED ORDER — PNEUMOCOCCAL VAC POLYVALENT 25 MCG/0.5ML IJ INJ
0.5000 mL | INJECTION | INTRAMUSCULAR | Status: DC
  Filled 2019-07-09 (×3): qty 0.5

## 2019-07-09 NOTE — Progress Notes (Signed)
RT called to patient room to place patient on bipap per MD order due to ABG results.  Patient placed on bipap and is currently tolerating well.  Will continue to monitor.

## 2019-07-09 NOTE — Progress Notes (Signed)
RT NOTES: Transported patient to [place], report given to [place] RT.

## 2019-07-09 NOTE — Progress Notes (Signed)
PT Cancellation Note  Patient Details Name: Nicholas Caldwell MRN: [id number] DOB: 07/15/1946   Cancelled Treatment:    Reason Eval/Treat Not Completed: Medical issues which prohibited therapy(pt to transfer to 2c due to ABG and not currently appropriate)   Nicholas Caldwell 07/09/2019, 10:40 AM  Elwyn Reach, PT Acute Rehabilitation Services Pager: [phone] Office: [phone]

## 2019-07-09 NOTE — Significant Event (Signed)
Rapid Response Event Note  Overview: Time Called: 1004 Arrival Time: 1004 Event Type: Respiratory, Neurologic  Initial Focused Assessment: Patient somnolent this am.   MD at bedside ordered ABG ABG resulted: 7.19/108/74/40  Upon my arrival he will arouse but is very sleepy, poor cough effort.   Interventions: Narcan given IV: pt a little more awake but no significant change in LOC Placed on Bipap  126/74  HR 88 RR 25  O2 sat 99% on Bipap 14/6 Fio2 40%   Transported to 2C13 via bed with O2 via Bipap Opens eyes to voice and attempts to cough.  Plan of Care (if not transferred): RN to call if patient's MS doesn't improve with Bipap treatment  Event Summary: Name of Physician Notified: Rizwan at bedside prior to my arrival at      at    Outcome: Transferred (Comment)(Progressive care)  Event End Time: [place]  Raliegh Ip

## 2019-07-09 NOTE — Progress Notes (Signed)
CRITICAL VALUE ALERT  Critical Value:  ABG results   Date & Time Notied: 07/09/2019 at [place]  Provider Notified: X. Blount  Orders Received/Actions taken: RT to change BiPap settings

## 2019-07-09 NOTE — Progress Notes (Addendum)
PROGRESS NOTE    Nicholas Caldwell   GZF:[id number]  DOB: 04-Sep-1946  DOA: 07/07/2019 PCP: [facility]   Brief Narrative:  Nicholas Caldwell   is a 73 y.o. male,  w hypertension, hyperlipidemia, Pafib, CAD, CHF (EF 40-45%) , Dm2, w neuropathy, Copd on home o2 3L [place], ? OSA, w recent admission 06/10/2019 for acute on chronic respiratory failure secondary to CHF,  apparently c/o dyspnea for the past 3 week, as well as weight gain of about 15lbs per pt.  His breathing has been worse for the past 3 days.  Pulse ox was 74% on room air.  CXR > Cardiomegaly with interstitial edema and layering effusions. Pro BNP. 2,211.9 Troponin 37 He was last admitted from 8/5-8/10 for CHF exacerbation.   Subjective: Still short of breath.     Assessment & Plan:   Principal Problem:   Acute on chronic combined systolic and diastolic CHF and RV failure  - last weight was 116 kg on 8/10- now is 128 kg - on Demadex at home which was "not working"- he will need a higher dose when discharged again - cont to diurese with IV Lasix until dyspnea resolves - EF 40-45 %, diffuse LV hypokinesis, RV was severely enlarged - current ECHO shows improved EF to 60%- current exacerbation is likely due to diastolic failure  NOTE: Found lethargic this AM by me- Stat ABG : pH 7.19, pCO2 108, PO2 73  (on 3 L O2) He was given 10 mg of Oxycodone this AM and thus I ordered Narcan  - he did awaken a little Bipap started and transferred to SDU  Active Problems:  Hypotension  - BP in low 100s- follow closely while diuresing  PAF - Was seen in past by Dr. Rayann Heman, not thought to be good ablation candidate. He has not been on Tikosyn or amiodarone due to history of long QT.  - Continue Eliquis.  - in sinus rhythm with short episodes of v tach  V tach on 9/4   QTc now is 480- started low dose Coreg- V tach has resolved  - will get EGK to follow QT    COPD Severe, on 3L home oxygen.   OHS/OSA - will order CPAP at  home  Chronic kidney disease stage III-IV - follow with diuresis-      Type 2 diabetes mellitus with neuropathy - cont 70/30 - sugars controlled    Elevated troponin I level - mildly elevated troponin at 30-49 range  Time spent in minutes: 45 of critical care time for acute respiratory failure DVT prophylaxis: Eliquis Code Status: Full code Family Communication:  Disposition Plan: home Consultants:   none Procedures:  2 D ECHO 1. The left ventricle has hyperdynamic systolic function, with an ejection fraction of >65%. The cavity size was normal. There is mildly increased left ventricular wall thickness. Left ventricular diastolic Doppler parameters are indeterminate. No  evidence of left ventricular regional wall motion abnormalities.  2. The right ventricle has mildly reduced systolic function. The cavity was moderately enlarged. There is not assessed.  3. Left atrial size was mildly dilated.  4. Right atrial size was moderately dilated.  5. The aortic valve is tricuspid. No stenosis of the aortic valve.  6. The aorta is not well visualized unless otherwise noted.  7. The inferior vena cava was dilated in size with <50% respiratory variability.  8. The interatrial septum was not well visualized. Antimicrobials:  Anti-infectives (From admission, onward)   None  Objective: Vitals:   07/09/19 1018 07/09/19 1026 07/09/19 1059 07/09/19 1121  BP: 126/74 (!) 129/59  95/62  Pulse: 87 76 80 81  Resp: (!) 25  (!) 25 20  Temp:    98.1 F (36.7 C)  TempSrc:    Oral  SpO2: 99% 98% 100% 97%  Weight:      Height:        Intake/Output Summary (Last 24 hours) at 07/09/2019 1246 Last data filed at 07/09/2019 0500 Gross per 24 hour  Intake 480 ml  Output 1400 ml  Net -920 ml   Filed Weights   07/07/19 2318 07/08/19 0631 07/09/19 0545  Weight: 126.9 kg 128.4 kg 127.1 kg    Examination: General exam: Appears comfortable  HEENT: PERRLA, oral mucosa moist, no sclera  icterus or thrush Respiratory system: Rhonchi and crackles heard Cardiovascular system: S1 & S2 heard,  No murmurs  Gastrointestinal system: Abdomen soft, non-tender, nondistended. Normal bowel sounds   Central nervous system: very sleepy Extremities: No cyanosis, clubbing or edema Skin: No rashes or ulcers      Data Reviewed: I have personally reviewed following labs and imaging studies  CBC: Recent Labs  Lab 07/07/19 1803 07/08/19 0530  WBC 5.6 5.7  NEUTROABS 4.4  --   HGB 9.6* 9.1*  HCT 32.5* 30.4*  MCV 85.1 84.9  PLT 355 324   Basic Metabolic Panel: Recent Labs  Lab 07/07/19 1803 07/08/19 0530 07/09/19 0601  NA 133* 136 133*  K 4.0 3.8 5.7*  CL 84* 86* 85*  CO2 35* 39* 34*  GLUCOSE 111* 149* 140*  BUN 60* 62* 74*  CREATININE 2.40* 2.45* 2.69*  CALCIUM 8.7* 8.6* 8.6*   GFR: Estimated Creatinine Clearance: 34.7 mL/min (A) (by C-G formula based on SCr of 2.69 mg/dL (H)). Liver Function Tests: Recent Labs  Lab 07/07/19 1803 07/08/19 0530  AST 23 16  ALT 12 10  ALKPHOS 91 86  BILITOT 0.3 0.5  PROT 6.9 6.4*  ALBUMIN 3.1* 2.8*   No results for input(s): LIPASE, AMYLASE in the last 168 hours. No results for input(s): AMMONIA in the last 168 hours. Coagulation Profile: No results for input(s): INR, PROTIME in the last 168 hours. Cardiac Enzymes: No results for input(s): CKTOTAL, CKMB, CKMBINDEX, TROPONINI in the last 168 hours. BNP (last 3 results) No results for input(s): PROBNP in the last 8760 hours. HbA1C: No results for input(s): HGBA1C in the last 72 hours. CBG: Recent Labs  Lab 07/08/19 1639 07/08/19 2116 07/09/19 0636 07/09/19 1017 07/09/19 1125  GLUCAP 116* 106* 142* 158* 149*   Lipid Profile: Recent Labs    07/08/19 0530  CHOL 126  HDL 52  LDLCALC 52  TRIG 110  CHOLHDL 2.4   Thyroid Function Tests: No results for input(s): TSH, T4TOTAL, FREET4, T3FREE, THYROIDAB in the last 72 hours. Anemia Panel: No results for input(s):  VITAMINB12, FOLATE, FERRITIN, TIBC, IRON, RETICCTPCT in the last 72 hours. Urine analysis:    Component Value Date/Time   COLORURINE STRAW (A) 07/08/2019 0008   APPEARANCEUR CLEAR 07/08/2019 0008   LABSPEC 1.006 07/08/2019 0008   PHURINE 6.0 07/08/2019 0008   GLUCOSEU NEGATIVE 07/08/2019 0008   HGBUR NEGATIVE 07/08/2019 0008   BILIRUBINUR NEGATIVE 07/08/2019 0008   KETONESUR NEGATIVE 07/08/2019 0008   PROTEINUR NEGATIVE 07/08/2019 0008   UROBILINOGEN 0.2 10/26/2014 2206   NITRITE NEGATIVE 07/08/2019 0008   LEUKOCYTESUR NEGATIVE 07/08/2019 0008   Sepsis Labs: _0 (procalcitonin:4,lacticidven:4) ) Recent Results (from the past 240 hour(s))  SARS  Coronavirus 2 [facility] order, Performed in [facility] hospital lab) Nasopharyngeal Nasopharyngeal Swab     Status: None   Collection Time: 07/07/19  6:07 PM   Specimen: Nasopharyngeal Swab  Result Value Ref Range Status   SARS Coronavirus 2 NEGATIVE NEGATIVE Final    Comment: (NOTE) If result is NEGATIVE SARS-CoV-2 target nucleic acids are NOT DETECTED. The SARS-CoV-2 RNA is generally detectable in upper and lower  respiratory specimens during the acute phase of infection. The lowest  concentration of SARS-CoV-2 viral copies this assay can detect is 250  copies / mL. A negative result does not preclude SARS-CoV-2 infection  and should not be used as the sole basis for treatment or other  patient management decisions.  A negative result may occur with  improper specimen collection / handling, submission of specimen other  than nasopharyngeal swab, presence of viral mutation(s) within the  areas targeted by this assay, and inadequate number of viral copies  (<250 copies / mL). A negative result must be combined with clinical  observations, patient history, and epidemiological information. If result is POSITIVE SARS-CoV-2 target nucleic acids are DETECTED. The SARS-CoV-2 RNA is generally detectable in upper and lower  respiratory  specimens dur ing the acute phase of infection.  Positive  results are indicative of active infection with SARS-CoV-2.  Clinical  correlation with patient history and other diagnostic information is  necessary to determine patient infection status.  Positive results do  not rule out bacterial infection or co-infection with other viruses. If result is PRESUMPTIVE POSTIVE SARS-CoV-2 nucleic acids MAY BE PRESENT.   A presumptive positive result was obtained on the submitted specimen  and confirmed on repeat testing.  While 2019 novel coronavirus  (SARS-CoV-2) nucleic acids may be present in the submitted sample  additional confirmatory testing may be necessary for epidemiological  and / or clinical management purposes  to differentiate between  SARS-CoV-2 and other Sarbecovirus currently known to infect humans.  If clinically indicated additional testing with an alternate test  methodology [phone]) is advised. The SARS-CoV-2 RNA is generally  detectable in upper and lower respiratory sp ecimens during the acute  phase of infection. The expected result is Negative. Fact Sheet for Patients:  StrictlyIdeas.no Fact Sheet for Healthcare Providers: BankingDealers.co.za This test is not yet approved or cleared by the [place] FDA and has been authorized for detection and/or diagnosis of SARS-CoV-2 by FDA under an Emergency Use Authorization (EUA).  This EUA will remain in effect (meaning this test can be used) for the duration of the COVID-19 declaration under Section 564(b)(1) of the Act, 21 U.S.C. section 360bbb-3(b)(1), unless the authorization is terminated or revoked sooner. Performed at [facility], [place] [address], [place], [place] [address]          Radiology Studies: Dg Chest Port 1 View  Result Date: 07/07/2019 CLINICAL DATA:  Patient with shortness of breath. EXAM: PORTABLE CHEST 1 VIEW COMPARISON:  Chest radiograph  06/08/2019 FINDINGS: Monitoring leads overlie the patient. Stable cardiomegaly. Diffuse bilateral interstitial pulmonary opacities. Small bilateral pleural effusions, left greater than right. No pneumothorax. IMPRESSION: Cardiomegaly with interstitial edema and layering effusions. Electronically Signed   By: Lovey Newcomer M.D.   On: 07/07/2019 18:53      Scheduled Meds: . apixaban  5 mg Oral BID  . aspirin  81 mg Oral Daily  . budesonide (PULMICORT) nebulizer solution  0.25 mg Nebulization BID  . carvedilol  3.125 mg Oral BID WC  . ferrous sulfate  325 mg Oral BID  . furosemide  80 mg Intravenous BID  . gabapentin  300 mg Oral BID  . guaiFENesin  600 mg Oral BID  . ipratropium  0.5 mg Nebulization Q6H  . isosorbide-hydrALAZINE  0.5 tablet Oral TID  . levalbuterol  0.63 mg Nebulization Q6H  . mometasone-formoterol  2 puff Inhalation BID  . naLOXone (NARCAN)  injection  0.4 mg Intravenous Once  . oxybutynin  5 mg Oral QID  . pantoprazole  40 mg Oral BID  . potassium chloride SA  60 mEq Oral BID  . rosuvastatin  20 mg Oral q1800  . sodium chloride flush  3 mL Intravenous Q12H  . tamsulosin  0.4 mg Oral QPC supper   Continuous Infusions: . sodium chloride       LOS: 1 day      Debbe Odea, MD [facility] Pager: [email or web address] Password [email or web address] 07/09/2019, 12:46 PM

## 2019-07-09 NOTE — Progress Notes (Signed)
ABG results given to RN by RT. Increased FIO2 to 60%.

## 2019-07-10 LAB — BASIC METABOLIC PANEL
Anion gap: 12 (ref 5–15)
BUN: 74 mg/dL — ABNORMAL HIGH (ref 8–23)
CO2: 37 mmol/L — ABNORMAL HIGH (ref 22–32)
Calcium: 8.9 mg/dL (ref 8.9–10.3)
Chloride: 90 mmol/L — ABNORMAL LOW (ref 98–111)
Creatinine, Ser: 2.14 mg/dL — ABNORMAL HIGH (ref 0.61–1.24)
GFR calc Af Amer: 35 mL/min — ABNORMAL LOW (ref >60)
GFR calc non Af Amer: 30 mL/min — ABNORMAL LOW (ref >60)
Glucose, Bld: 100 mg/dL — ABNORMAL HIGH (ref 70–99)
Potassium: 5.6 mmol/L — ABNORMAL HIGH (ref 3.5–5.1)
Sodium: 139 mmol/L (ref 135–145)

## 2019-07-10 LAB — GLUCOSE, CAPILLARY
Glucose-Capillary: 107 mg/dL — ABNORMAL HIGH (ref 70–99)
Glucose-Capillary: 152 mg/dL — ABNORMAL HIGH (ref 70–99)
Glucose-Capillary: 143 mg/dL — ABNORMAL HIGH (ref 70–99)
Glucose-Capillary: 190 mg/dL — ABNORMAL HIGH (ref 70–99)

## 2019-07-10 LAB — CBC
HCT: 32.2 % — ABNORMAL LOW (ref 39.0–52.0)
Hemoglobin: 9.1 g/dL — ABNORMAL LOW (ref 13.0–17.0)
MCH: 25.1 pg — ABNORMAL LOW (ref 26.0–34.0)
MCHC: 28.3 g/dL — ABNORMAL LOW (ref 30.0–36.0)
MCV: 88.7 fL (ref 80.0–100.0)
Platelets: 349 10*3/uL (ref 150–400)
RBC: 3.63 MIL/uL — ABNORMAL LOW (ref 4.22–5.81)
RDW: 17.5 % — ABNORMAL HIGH (ref 11.5–15.5)
WBC: 5.3 10*3/uL (ref 4.0–10.5)
nRBC: 0 % (ref 0.0–0.2)

## 2019-07-10 LAB — BLOOD GAS, ARTERIAL
Acid-Base Excess: 13.1 mmol/L — ABNORMAL HIGH (ref 0.0–2.0)
Bicarbonate: 40.1 mmol/L — ABNORMAL HIGH (ref 20.0–28.0)
Delivery systems: POSITIVE
Drawn by: 55062
Expiratory PAP: 6
FIO2: 0.6
Inspiratory PAP: 16
O2 Saturation: 89.1 %
Patient temperature: 98.4
pCO2 arterial: 88.3 mmHg (ref 32.0–48.0)
pH, Arterial: 7.279 — ABNORMAL LOW (ref 7.350–7.450)
pO2, Arterial: 62.3 mmHg — ABNORMAL LOW (ref 83.0–108.0)

## 2019-07-10 MED ORDER — CARVEDILOL 3.125 MG PO TABS
3.1250 mg | ORAL_TABLET | Freq: Two times a day (BID) | ORAL | Status: DC
  Administered 2019-07-10 – 2019-07-22 (×18): 3.125 mg via ORAL
  Filled 2019-07-10 (×27): qty 1

## 2019-07-10 MED ORDER — INSULIN ASPART PROT & ASPART (70-30 MIX) 100 UNIT/ML ~~LOC~~ SUSP
10.0000 [IU] | Freq: Two times a day (BID) | SUBCUTANEOUS | Status: DC
  Administered 2019-07-10 – 2019-07-18 (×17): 10 [IU] via SUBCUTANEOUS

## 2019-07-10 MED ORDER — IPRATROPIUM BROMIDE 0.02 % IN SOLN: 0.5000 mg | Freq: Four times a day (QID) | RESPIRATORY_TRACT | Status: DC | PRN

## 2019-07-10 MED ORDER — LEVALBUTEROL HCL 0.63 MG/3ML IN NEBU
0.6300 mg | INHALATION_SOLUTION | Freq: Four times a day (QID) | RESPIRATORY_TRACT | Status: DC | PRN
  Administered 2019-07-13: 0.63 mg via RESPIRATORY_TRACT
  Filled 2019-07-10: qty 3

## 2019-07-10 NOTE — Progress Notes (Signed)
RT NOTES: Found patient off bipap and on 6L [place]. Patient seems to be tolerating well. Alert and oriented, sats 91%. Will continue to monitor.

## 2019-07-10 NOTE — Progress Notes (Signed)
Orthopedic Tech Progress Note Patient Details:  Nicholas Caldwell November 06, 1945 [id number]  Ortho Devices Type of Ortho Device: Louretta [place] boot Ortho Device/Splint Location: Bilateral unna boots Ortho Device/Splint Interventions: Application   Post Interventions Patient Tolerated: Well Instructions Provided: Care of device   Nicholas Caldwell 07/10/2019, 5:59 PM

## 2019-07-10 NOTE — Evaluation (Signed)
Physical Therapy Evaluation Patient Details Name: Nicholas Caldwell MRN: [id number] DOB: 11-28-1945 Today's Date: 07/10/2019   History of Present Illness  73 yo admitted with SOB with CHF exacerbation. PMHx: Afib, CAD, CHF, COPD on home O2, DM, HTN, HLD  Clinical Impression  PT sitting in chair on arrival after transfer via maximove by nursing. Pt very slow to respond at times with periods of blank stares without verbalization with delayed motor response. Pt with decreased cognition, mobility and strength from last admission and will benefit from acute therapy to maximize mobility, function and activity to decrease burden of care and fall risk. Pt encouraged to assist nursing with mobility. Pt with SpO2 92% on 6L with drop to 84% with standing trial HR 92-96    Follow Up Recommendations SNF;Supervision/Assistance - 24 hour;Home health PT(pending progression)    Equipment Recommendations  None recommended by PT    Recommendations for Other Services OT consult     Precautions / Restrictions Precautions Precautions: Fall Precaution Comments: watch sats      Mobility  Bed Mobility               General bed mobility comments: pt in chair on arrival via maximove by nursing  Transfers Overall transfer level: Needs assistance   Transfers: Sit to/from Stand Sit to Stand: Min assist         General transfer comment: min assist with multimodal cues and increased time to lean forward in chair and stand. pt only tolerating standing grossly 15 sec before returning to sitting and declined further standing. Pt able to scoot forward and back in chair with supervision  Ambulation/Gait             General Gait Details: cognition limiting ability to attempt today  Stairs            Wheelchair Mobility    Modified Rankin (Stroke Patients Only)       Balance Overall balance assessment: Needs assistance   Sitting balance-Leahy Scale: Fair     Standing balance support:  Bilateral upper extremity supported Standing balance-Leahy Scale: Poor                               Pertinent Vitals/Pain Pain Assessment: No/denies pain    Home Living Family/patient expects to be discharged to:: Private residence Living Arrangements: Spouse/significant other Available Help at Discharge: Family;Available 24 hours/day Type of Home: House Home Access: Stairs to enter   [employer] of Steps: 1 Home Layout: One level Home Equipment: Walker - 2 wheels;Cane - single point;Bedside commode;Shower seat Additional Comments: on 3L of O2 at all times at home    Prior Function Level of Independence: Needs assistance   Gait / Transfers Assistance Needed: uses RW for mobility  ADL's / Homemaking Assistance Needed: assist from wife for LB ADL and wife does IADL        Hand Dominance        Extremity/Trunk Assessment   Upper Extremity Assessment Upper Extremity Assessment: Generalized weakness    Lower Extremity Assessment Lower Extremity Assessment: Generalized weakness    Cervical / Trunk Assessment Cervical / Trunk Assessment: Kyphotic  Communication   Communication: No difficulties  Cognition Arousal/Alertness: Awake/alert Behavior During Therapy: Flat affect Overall Cognitive Status: Impaired/Different from baseline Area of Impairment: Orientation;Attention;Memory;Following commands;Safety/judgement;Problem solving                 Orientation Level: Disoriented to;Time;Situation Current Attention Level:  Focused Memory: Decreased short-term memory Following Commands: Follows one step commands inconsistently;Follows one step commands with increased time Safety/Judgement: Decreased awareness of safety;Decreased awareness of deficits   Problem Solving: Slow processing;Requires verbal cues;Requires tactile cues General Comments: pt stating "saturday" for orientation of day and month. pt unaware of situation asking "what  happened". Pt with very delayed response to cues for mobility at one point taking 3 min prior to any verbal response or movement.      General Comments      Exercises General Exercises - Lower Extremity Long Arc Quad: AROM;Both;Seated;10 reps Hip Flexion/Marching: AROM;Both;Seated;10 reps   Assessment/Plan    PT Assessment Patient needs continued PT services  PT Problem List Decreased strength;Decreased mobility;Decreased safety awareness;Decreased coordination;Decreased activity tolerance;Decreased balance;Decreased knowledge of use of DME;Cardiopulmonary status limiting activity;Decreased cognition       PT Treatment Interventions Gait training;Therapeutic exercise;Patient/family education;Functional mobility training;Balance training;Stair training;DME instruction;Therapeutic activities;Cognitive remediation    PT Goals (Current goals can be found in the Care Plan section)  Acute Rehab PT Goals Patient Stated Goal: return home and watch tv PT Goal Formulation: With patient Time For Goal Achievement: 07/24/19 Potential to Achieve Goals: Fair    Frequency Min 3X/week   Barriers to discharge        Co-evaluation               AM-PAC PT "6 Clicks" Mobility  Outcome Measure Help needed turning from your back to your side while in a flat bed without using bedrails?: A Lot Help needed moving from lying on your back to sitting on the side of a flat bed without using bedrails?: A Lot Help needed moving to and from a bed to a chair (including a wheelchair)?: A Lot Help needed standing up from a chair using your arms (e.g., wheelchair or bedside chair)?: A Little Help needed to walk in hospital room?: A Lot Help needed climbing 3-5 steps with a railing? : A Lot 6 Click Score: 13    End of Session Equipment Utilized During Treatment: Oxygen Activity Tolerance: Patient tolerated treatment well Patient left: in chair;with call bell/phone within reach;with chair alarm  set Nurse Communication: Mobility status PT Visit Diagnosis: Muscle weakness (generalized) (M62.81);Other abnormalities of gait and mobility (R26.89);History of falling (Z91.81)    Time: 9359-4090 PT Time Calculation (min) (ACUTE ONLY): 21 min   Charges:   PT Evaluation $PT Eval Moderate Complexity: 1 Mod          [place], PT Acute Rehabilitation Services Pager: [phone] Office: [phone]   Sandy Salaam Shantasia Hunnell 07/10/2019, 12:26 PM

## 2019-07-10 NOTE — Progress Notes (Signed)
PROGRESS NOTE    Nicholas Caldwell   XYI:[id number]  DOB: December 17, 1945  DOA: 07/07/2019 PCP: [facility]   Brief Narrative:  Nicholas Caldwell   is a 73 y.o. male,  w hypertension, hyperlipidemia, Pafib, CAD, CHF (EF 40-45%) , Dm2, w neuropathy, Copd on home o2 3L [place], ? OSA, w recent admission 06/10/2019 for acute on chronic respiratory failure secondary to CHF,  apparently c/o dyspnea for the past 3 week, as well as weight gain of about 15lbs per pt.  His breathing has been worse for the past 3 days.  Pulse ox was 74% on room air.  CXR > Cardiomegaly with interstitial edema and layering effusions. Pro BNP. 2,211.9 Troponin 37 He was last admitted from 8/5-8/10 for CHF exacerbation.   Subjective: Short of breath when moved around and when laying flat    Assessment & Plan:   Principal Problem:   Acute on chronic combined systolic and diastolic CHF and RV failure  - last weight was 116 kg on 8/10- now is 128 kg - on Demadex at home which was "not working"- he will need a higher dose when discharged again - cont to diurese with IV Lasix until dyspnea resolves- he is improving but remains short of breath - EF 40-45 %, diffuse LV hypokinesis, RV was severely enlarged - current ECHO shows improved EF to 60%- current exacerbation is likely due to diastolic failure  Hypercapnic respiratory failure -Noted on 9/5 to be lethargic Stat ABG : pH 7.19, pCO2 108, PO2 73  (on 3 L O2) He was given 10 mg of Oxycodone a few hours before I saw him and thus I ordered Narcan  - he did awaken a little Bipap started and transferred to SDU -Hypercapnia has resolved narcotics held  OHS/OSA -Continue CPAP -he admits that he does not wear his CPAP at least 3 nights out of the week which may be causing his CHF exacerbations  Chronic pedal edema - He wears Unna boots which will be changed today  Hypotension  - BP in low 100s- follow closely while diuresing -Holding BiDil  PAF - Was seen in past by  Dr. Rayann Heman, not thought to be good ablation candidate. He has not been on Tikosyn or amiodarone due to history of long QT.  - Continue Eliquis.  - in sinus rhythm with short episodes of v tach  V tach on 9/4   QTc now is 480- started low dose Coreg- V tach has resolved  - will get EGK to follow QT  Morbid obesity Body mass index is 37.64 kg/m.    COPD Severe, on 3L home oxygen.    Chronic kidney disease stage III-IV - follow with diuresis-      Type 2 diabetes mellitus with neuropathy - cont 70/30 - sugars controlled    Elevated troponin I level - mildly elevated high-sensitivity troponin at 30-49 range  Time spent in minutes: 35  DVT prophylaxis: Eliquis Code Status: Full code Family Communication:  Disposition Plan: home-he is wheelchair-bound Consultants:   none Procedures:  2 D ECHO 1. The left ventricle has hyperdynamic systolic function, with an ejection fraction of >65%. The cavity size was normal. There is mildly increased left ventricular wall thickness. Left ventricular diastolic Doppler parameters are indeterminate. No  evidence of left ventricular regional wall motion abnormalities.  2. The right ventricle has mildly reduced systolic function. The cavity was moderately enlarged. There is not assessed.  3. Left atrial size was mildly dilated.  4. Right  atrial size was moderately dilated.  5. The aortic valve is tricuspid. No stenosis of the aortic valve.  6. The aorta is not well visualized unless otherwise noted.  7. The inferior vena cava was dilated in size with <50% respiratory variability.  8. The interatrial septum was not well visualized. Antimicrobials:  Anti-infectives (From admission, onward)   None       Objective: Vitals:   07/10/19 0733 07/10/19 0735 07/10/19 1028 07/10/19 1107  BP: (!) 114/59  110/62   Pulse: 87  87   Resp: (!) 21  (!) 25   Temp:   98.4 F (36.9 C)   TempSrc:   Other (Comment)   SpO2: 99% 99% 91% 91%  Weight:       Height:        Intake/Output Summary (Last 24 hours) at 07/10/2019 1329 Last data filed at 07/10/2019 1218 Gross per 24 hour  Intake 485 ml  Output 1950 ml  Net -1465 ml   Filed Weights   07/08/19 0631 07/09/19 0545 07/10/19 0600  Weight: 128.4 kg 127.1 kg 125.9 kg    Examination: General exam: Appears comfortable  HEENT: PERRLA, oral mucosa moist, no sclera icterus or thrush Respiratory system: Clear to auscultation. Respiratory effort normal. Cardiovascular system: S1 & S2 heard,  No murmurs  Gastrointestinal system: Abdomen soft, non-tender, nondistended. Normal bowel sounds   Central nervous system: Alert and oriented. No focal neurological deficits. Extremities: No cyanosis, clubbing or edema-  Skin: No rashes or ulcers Psychiatry:  Mood & affect appropriate.   Data Reviewed: I have personally reviewed following labs and imaging studies  CBC: Recent Labs  Lab 07/07/19 1803 07/08/19 0530 07/10/19 0756  WBC 5.6 5.7 5.3  NEUTROABS 4.4  --   --   HGB 9.6* 9.1* 9.1*  HCT 32.5* 30.4* 32.2*  MCV 85.1 84.9 88.7  PLT 355 346 426   Basic Metabolic Panel: Recent Labs  Lab 07/07/19 1803 07/08/19 0530 07/09/19 0601 07/10/19 0756  NA 133* 136 133* 139  K 4.0 3.8 5.7* 5.6*  CL 84* 86* 85* 90*  CO2 35* 39* 34* 37*  GLUCOSE 111* 149* 140* 100*  BUN 60* 62* 74* 74*  CREATININE 2.40* 2.45* 2.69* 2.14*  CALCIUM 8.7* 8.6* 8.6* 8.9   GFR: Estimated Creatinine Clearance: 43.4 mL/min (A) (by C-G formula based on SCr of 2.14 mg/dL (H)). Liver Function Tests: Recent Labs  Lab 07/07/19 1803 07/08/19 0530  AST 23 16  ALT 12 10  ALKPHOS 91 86  BILITOT 0.3 0.5  PROT 6.9 6.4*  ALBUMIN 3.1* 2.8*   No results for input(s): LIPASE, AMYLASE in the last 168 hours. No results for input(s): AMMONIA in the last 168 hours. Coagulation Profile: No results for input(s): INR, PROTIME in the last 168 hours. Cardiac Enzymes: No results for input(s): CKTOTAL, CKMB, CKMBINDEX,  TROPONINI in the last 168 hours. BNP (last 3 results) No results for input(s): PROBNP in the last 8760 hours. HbA1C: No results for input(s): HGBA1C in the last 72 hours. CBG: Recent Labs  Lab 07/09/19 1125 07/09/19 1601 07/09/19 2119 07/10/19 0615 07/10/19 1056  GLUCAP 149* 121* 119* 107* 152*   Lipid Profile: Recent Labs    07/08/19 0530  CHOL 126  HDL 52  LDLCALC 52  TRIG 110  CHOLHDL 2.4   Thyroid Function Tests: No results for input(s): TSH, T4TOTAL, FREET4, T3FREE, THYROIDAB in the last 72 hours. Anemia Panel: No results for input(s): VITAMINB12, FOLATE, FERRITIN, TIBC, IRON, RETICCTPCT in the  last 72 hours. Urine analysis:    Component Value Date/Time   COLORURINE STRAW (A) 07/08/2019 0008   APPEARANCEUR CLEAR 07/08/2019 0008   LABSPEC 1.006 07/08/2019 0008   PHURINE 6.0 07/08/2019 0008   GLUCOSEU NEGATIVE 07/08/2019 0008   HGBUR NEGATIVE 07/08/2019 0008   BILIRUBINUR NEGATIVE 07/08/2019 0008   KETONESUR NEGATIVE 07/08/2019 0008   PROTEINUR NEGATIVE 07/08/2019 0008   UROBILINOGEN 0.2 10/26/2014 2206   NITRITE NEGATIVE 07/08/2019 0008   LEUKOCYTESUR NEGATIVE 07/08/2019 0008   Sepsis Labs: _0 (procalcitonin:4,lacticidven:4) ) Recent Results (from the past 240 hour(s))  SARS Coronavirus 2 [facility] order, Performed in [facility] hospital lab) Nasopharyngeal Nasopharyngeal Swab     Status: None   Collection Time: 07/07/19  6:07 PM   Specimen: Nasopharyngeal Swab  Result Value Ref Range Status   SARS Coronavirus 2 NEGATIVE NEGATIVE Final    Comment: (NOTE) If result is NEGATIVE SARS-CoV-2 target nucleic acids are NOT DETECTED. The SARS-CoV-2 RNA is generally detectable in upper and lower  respiratory specimens during the acute phase of infection. The lowest  concentration of SARS-CoV-2 viral copies this assay can detect is 250  copies / mL. A negative result does not preclude SARS-CoV-2 infection  and should not be used as the sole basis for  treatment or other  patient management decisions.  A negative result may occur with  improper specimen collection / handling, submission of specimen other  than nasopharyngeal swab, presence of viral mutation(s) within the  areas targeted by this assay, and inadequate number of viral copies  (<250 copies / mL). A negative result must be combined with clinical  observations, patient history, and epidemiological information. If result is POSITIVE SARS-CoV-2 target nucleic acids are DETECTED. The SARS-CoV-2 RNA is generally detectable in upper and lower  respiratory specimens dur ing the acute phase of infection.  Positive  results are indicative of active infection with SARS-CoV-2.  Clinical  correlation with patient history and other diagnostic information is  necessary to determine patient infection status.  Positive results do  not rule out bacterial infection or co-infection with other viruses. If result is PRESUMPTIVE POSTIVE SARS-CoV-2 nucleic acids MAY BE PRESENT.   A presumptive positive result was obtained on the submitted specimen  and confirmed on repeat testing.  While 2019 novel coronavirus  (SARS-CoV-2) nucleic acids may be present in the submitted sample  additional confirmatory testing may be necessary for epidemiological  and / or clinical management purposes  to differentiate between  SARS-CoV-2 and other Sarbecovirus currently known to infect humans.  If clinically indicated additional testing with an alternate test  methodology [id number]) is advised. The SARS-CoV-2 RNA is generally  detectable in upper and lower respiratory sp ecimens during the acute  phase of infection. The expected result is Negative. Fact Sheet for Patients:  StrictlyIdeas.no Fact Sheet for Healthcare Providers: BankingDealers.co.za This test is not yet approved or cleared by the [place] FDA and has been authorized for detection and/or  diagnosis of SARS-CoV-2 by FDA under an Emergency Use Authorization (EUA).  This EUA will remain in effect (meaning this test can be used) for the duration of the COVID-19 declaration under Section 564(b)(1) of the Act, 21 U.S.C. section 360bbb-3(b)(1), unless the authorization is terminated or revoked sooner. Performed at [facility], [place] [address] [place],  [address]          Radiology Studies: No results found.    Scheduled Meds: . apixaban  5 mg Oral BID  . aspirin  81 mg Oral Daily  . budesonide (PULMICORT) nebulizer solution  0.25 mg Nebulization BID  . chlorhexidine  15 mL Mouth Rinse BID  . ferrous sulfate  325 mg Oral BID  . furosemide  80 mg Intravenous BID  . gabapentin  300 mg Oral BID  . guaiFENesin  600 mg Oral BID  . insulin aspart protamine- aspart  10 Units Subcutaneous BID WC  . mouth rinse  15 mL Mouth Rinse q12n4p  . mometasone-formoterol  2 puff Inhalation BID  . naLOXone (NARCAN)  injection  0.4 mg Intravenous Once  . oxybutynin  5 mg Oral QID  . pantoprazole  40 mg Oral BID  . pneumococcal 23 valent vaccine  0.5 mL Intramuscular Tomorrow-1000  . rosuvastatin  20 mg Oral q1800  . sodium chloride flush  3 mL Intravenous Q12H  . tamsulosin  0.4 mg Oral QPC supper   Continuous Infusions: . sodium chloride       LOS: 2 days      Debbe Odea, MD [facility] Pager: [email or web address] Password [email or web address] 07/10/2019, 1:29 PM

## 2019-07-11 LAB — BASIC METABOLIC PANEL
Anion gap: 10 (ref 5–15)
BUN: 77 mg/dL — ABNORMAL HIGH (ref 8–23)
CO2: 38 mmol/L — ABNORMAL HIGH (ref 22–32)
Calcium: 8.6 mg/dL — ABNORMAL LOW (ref 8.9–10.3)
Chloride: 90 mmol/L — ABNORMAL LOW (ref 98–111)
Creatinine, Ser: 2.28 mg/dL — ABNORMAL HIGH (ref 0.61–1.24)
GFR calc Af Amer: 32 mL/min — ABNORMAL LOW (ref >60)
GFR calc non Af Amer: 28 mL/min — ABNORMAL LOW (ref >60)
Glucose, Bld: 138 mg/dL — ABNORMAL HIGH (ref 70–99)
Potassium: 4.7 mmol/L (ref 3.5–5.1)
Sodium: 138 mmol/L (ref 135–145)

## 2019-07-11 LAB — GLUCOSE, CAPILLARY
Glucose-Capillary: 171 mg/dL — ABNORMAL HIGH (ref 70–99)
Glucose-Capillary: 197 mg/dL — ABNORMAL HIGH (ref 70–99)
Glucose-Capillary: 202 mg/dL — ABNORMAL HIGH (ref 70–99)
Glucose-Capillary: 154 mg/dL — ABNORMAL HIGH (ref 70–99)

## 2019-07-11 MED ORDER — OXYCODONE-ACETAMINOPHEN 10-325 MG PO TABS: 1.0000 | ORAL_TABLET | Freq: Four times a day (QID) | ORAL | Status: DC | PRN

## 2019-07-11 MED ORDER — OXYCODONE HCL 5 MG PO TABS
5.0000 mg | ORAL_TABLET | Freq: Four times a day (QID) | ORAL | Status: DC | PRN
  Administered 2019-07-11 – 2019-07-12 (×5): 5 mg via ORAL
  Filled 2019-07-11 (×6): qty 1

## 2019-07-11 MED ORDER — OXYCODONE-ACETAMINOPHEN 5-325 MG PO TABS
1.0000 | ORAL_TABLET | Freq: Four times a day (QID) | ORAL | Status: DC | PRN
  Administered 2019-07-11 – 2019-07-15 (×15): 1 via ORAL
  Filled 2019-07-11 (×16): qty 1

## 2019-07-11 NOTE — Care Management Important Message (Signed)
Important Message  Patient Details  Name: Nicholas Caldwell MRN: [id number] Date of Birth: 01/26/1946   Medicare Important Message Given:  Yes     Memory Argue 07/11/2019, 3:09 PM

## 2019-07-11 NOTE — Plan of Care (Signed)
  Problem: Activity: Goal: Capacity to carry out activities will improve Outcome: Progressing   Problem: Cardiac: Goal: Ability to achieve and maintain adequate cardiopulmonary perfusion will improve Outcome: Progressing   Problem: Education: Goal: Ability to verbalize understanding of medication therapies will improve Outcome: Progressing

## 2019-07-11 NOTE — Progress Notes (Addendum)
PROGRESS NOTE    Nicholas Caldwell   IWL:[id number]  DOB: 02/08/1946  DOA: 07/07/2019 PCP: [facility]   Brief Narrative:  Nicholas Caldwell   is a 73 y.o. male who is wheelchair bound  w hypertension, hyperlipidemia, Pafib, CAD, CHF (EF 40-45%) , Dm2, w neuropathy, Copd on home o2 3L [address],  OSA, w recent admission 06/10/2019 for acute on chronic respiratory failure secondary to CHF,  apparently c/o dyspnea for the past 3 week, as well as weight gain of about 15lbs per pt.  His breathing has been worse for the past 3 days.  Pulse ox was 74% on room air.  CXR > Cardiomegaly with interstitial edema and layering effusions. Pro BNP. 2,211.9 Troponin 37 He was last admitted from 8/5-8/10 for CHF exacerbation.   Subjective: Still short of breath when laying flat. He has pain in his right hip and is asking for his narcotics.   Assessment & Plan:   Principal Problem:   Acute on chronic diastolic CHF and RV failure  - last weight was 116 kg on 8/10- now is 128 kg - on Demadex at home which was "not working"- he will need a higher dose when discharged again - cont to diurese with IV Lasix until dyspnea resolves- he is improving but remains short of breath - EF 40-45 %, diffuse LV hypokinesis, RV was severely enlarged - current ECHO shows EF has improved to 60% and RV function mildly reduced - current exacerbation is likely due to diastolic failure - weight continues to improve but not back to baseline- cont to diurese today  Hypercapnic respiratory failure -Noted on 9/5 to be lethargic Stat ABG : pH 7.19, pCO2 108, PO2 73  (on 3 L O2) He was given 10 mg of Oxycodone a few hours before I saw him and thus I ordered Narcan  - he did awaken a little Bipap started and transferred to SDU -Hypercapnia has resolved with BiPAP x 24 hrs  Morbid obesity  OHS/OSA Body mass index is 36.93 kg/m. -Continue CPAP -he admits that he does not wear his CPAP at least 3 nights out of the week which may be  causing his CHF exacerbations- we have had a discussion about this  Chronic pedal edema - He wears Unna boots that were changed yesterday  Hypotension  - BP was in low 100s- follow closely while diuresing -Holding BiDil  PAF - Was seen in past by Dr. Rayann Heman, not thought to be good ablation candidate. He has not been on Tikosyn or amiodarone due to history of long QT.  - Continue Eliquis.  - in sinus rhythm with short episodes of v tach  V tach on 9/4 - likely due to CHF exacerbation   QTc checked and was no longer high>  480- started low dose Coreg- V tach has resolved- last episode was on 9/5 - will recheck EKG  Chronic pain - will carefully resume narcotics, as his CHF exacerbation is improving and as long as he wears his CPAP at night, he should not have CO2 retention    COPD Severe, on 3L home oxygen.   Chronic kidney disease stage III-IV - follow with diuresis-      Type 2 diabetes mellitus with neuropathy - cont 70/30 - sugars controlled    Elevated troponin I level - mildly elevated high-sensitivity troponin at 30-49 range  Time spent in minutes: 35  DVT prophylaxis: Eliquis Code Status: Full code Family Communication:  Disposition Plan: home-he is wheelchair-bound Consultants:  none Procedures:  2 D ECHO 1. The left ventricle has hyperdynamic systolic function, with an ejection fraction of >65%. The cavity size was normal. There is mildly increased left ventricular wall thickness. Left ventricular diastolic Doppler parameters are indeterminate. No  evidence of left ventricular regional wall motion abnormalities.  2. The right ventricle has mildly reduced systolic function. The cavity was moderately enlarged. There is not assessed.  3. Left atrial size was mildly dilated.  4. Right atrial size was moderately dilated.  5. The aortic valve is tricuspid. No stenosis of the aortic valve.  6. The aorta is not well visualized unless otherwise noted.  7. The  inferior vena cava was dilated in size with <50% respiratory variability.  8. The interatrial septum was not well visualized. Antimicrobials:  Anti-infectives (From admission, onward)   None       Objective: Vitals:   07/11/19 0230 07/11/19 0720 07/11/19 0800 07/11/19 0853  BP: 122/62     Pulse: 78     Resp: 19     Temp: 98.8 F (37.1 C) 97.9 F (36.6 C)    TempSrc: Oral Oral    SpO2:    96%  Weight: 128.6 kg  123.5 kg   Height:        Intake/Output Summary (Last 24 hours) at 07/11/2019 1023 Last data filed at 07/11/2019 0900 Gross per 24 hour  Intake 2645 ml  Output 2000 ml  Net 645 ml   Filed Weights   07/10/19 0600 07/11/19 0230 07/11/19 0800  Weight: 125.9 kg 128.6 kg 123.5 kg    Examination: General exam: Appears comfortable  HEENT: PERRLA, oral mucosa moist, no sclera icterus or thrush Respiratory system: Clear to auscultation. Respiratory effort normal. Cardiovascular system: S1 & S2 heard,  No murmurs  Gastrointestinal system: Abdomen soft, non-tender, nondistended. Normal bowel sounds   Central nervous system: Alert and oriented. No focal neurological deficits. Extremities: No cyanosis, clubbing or edema- UNNA boots present Skin: No rashes or ulcers Psychiatry:  Mood & affect appropriate.   Data Reviewed: I have personally reviewed following labs and imaging studies  CBC: Recent Labs  Lab 07/07/19 1803 07/08/19 0530 07/10/19 0756  WBC 5.6 5.7 5.3  NEUTROABS 4.4  --   --   HGB 9.6* 9.1* 9.1*  HCT 32.5* 30.4* 32.2*  MCV 85.1 84.9 88.7  PLT 355 346 254   Basic Metabolic Panel: Recent Labs  Lab 07/07/19 1803 07/08/19 0530 07/09/19 0601 07/10/19 0756 07/11/19 0215  NA 133* 136 133* 139 138  K 4.0 3.8 5.7* 5.6* 4.7  CL 84* 86* 85* 90* 90*  CO2 35* 39* 34* 37* 38*  GLUCOSE 111* 149* 140* 100* 138*  BUN 60* 62* 74* 74* 77*  CREATININE 2.40* 2.45* 2.69* 2.14* 2.28*  CALCIUM 8.7* 8.6* 8.6* 8.9 8.6*   GFR: Estimated Creatinine Clearance: 40.4  mL/min (A) (by C-G formula based on SCr of 2.28 mg/dL (H)). Liver Function Tests: Recent Labs  Lab 07/07/19 1803 07/08/19 0530  AST 23 16  ALT 12 10  ALKPHOS 91 86  BILITOT 0.3 0.5  PROT 6.9 6.4*  ALBUMIN 3.1* 2.8*   No results for input(s): LIPASE, AMYLASE in the last 168 hours. No results for input(s): AMMONIA in the last 168 hours. Coagulation Profile: No results for input(s): INR, PROTIME in the last 168 hours. Cardiac Enzymes: No results for input(s): CKTOTAL, CKMB, CKMBINDEX, TROPONINI in the last 168 hours. BNP (last 3 results) No results for input(s): PROBNP in the last 8760  hours. HbA1C: No results for input(s): HGBA1C in the last 72 hours. CBG: Recent Labs  Lab 07/10/19 0615 07/10/19 1056 07/10/19 1609 07/10/19 2128 07/11/19 0618  GLUCAP 107* 152* 143* 190* 171*   Lipid Profile: No results for input(s): CHOL, HDL, LDLCALC, TRIG, CHOLHDL, LDLDIRECT in the last 72 hours. Thyroid Function Tests: No results for input(s): TSH, T4TOTAL, FREET4, T3FREE, THYROIDAB in the last 72 hours. Anemia Panel: No results for input(s): VITAMINB12, FOLATE, FERRITIN, TIBC, IRON, RETICCTPCT in the last 72 hours. Urine analysis:    Component Value Date/Time   COLORURINE STRAW (A) 07/08/2019 0008   APPEARANCEUR CLEAR 07/08/2019 0008   LABSPEC 1.006 07/08/2019 0008   PHURINE 6.0 07/08/2019 0008   GLUCOSEU NEGATIVE 07/08/2019 0008   HGBUR NEGATIVE 07/08/2019 0008   BILIRUBINUR NEGATIVE 07/08/2019 0008   KETONESUR NEGATIVE 07/08/2019 0008   PROTEINUR NEGATIVE 07/08/2019 0008   UROBILINOGEN 0.2 10/26/2014 2206   NITRITE NEGATIVE 07/08/2019 0008   LEUKOCYTESUR NEGATIVE 07/08/2019 0008   Sepsis Labs: _0 (procalcitonin:4,lacticidven:4) ) Recent Results (from the past 240 hour(s))  SARS Coronavirus 2 [facility] order, Performed in [facility] hospital lab) Nasopharyngeal Nasopharyngeal Swab     Status: None   Collection Time: 07/07/19  6:07 PM   Specimen: Nasopharyngeal  Swab  Result Value Ref Range Status   SARS Coronavirus 2 NEGATIVE NEGATIVE Final    Comment: (NOTE) If result is NEGATIVE SARS-CoV-2 target nucleic acids are NOT DETECTED. The SARS-CoV-2 RNA is generally detectable in upper and lower  respiratory specimens during the acute phase of infection. The lowest  concentration of SARS-CoV-2 viral copies this assay can detect is 250  copies / mL. A negative result does not preclude SARS-CoV-2 infection  and should not be used as the sole basis for treatment or other  patient management decisions.  A negative result may occur with  improper specimen collection / handling, submission of specimen other  than nasopharyngeal swab, presence of viral mutation(s) within the  areas targeted by this assay, and inadequate number of viral copies  (<250 copies / mL). A negative result must be combined with clinical  observations, patient history, and epidemiological information. If result is POSITIVE SARS-CoV-2 target nucleic acids are DETECTED. The SARS-CoV-2 RNA is generally detectable in upper and lower  respiratory specimens dur ing the acute phase of infection.  Positive  results are indicative of active infection with SARS-CoV-2.  Clinical  correlation with patient history and other diagnostic information is  necessary to determine patient infection status.  Positive results do  not rule out bacterial infection or co-infection with other viruses. If result is PRESUMPTIVE POSTIVE SARS-CoV-2 nucleic acids MAY BE PRESENT.   A presumptive positive result was obtained on the submitted specimen  and confirmed on repeat testing.  While 2019 novel coronavirus  (SARS-CoV-2) nucleic acids may be present in the submitted sample  additional confirmatory testing may be necessary for epidemiological  and / or clinical management purposes  to differentiate between  SARS-CoV-2 and other Sarbecovirus currently known to infect humans.  If clinically indicated  additional testing with an alternate test  methodology [phone]) is advised. The SARS-CoV-2 RNA is generally  detectable in upper and lower respiratory sp ecimens during the acute  phase of infection. The expected result is Negative. Fact Sheet for Patients:  StrictlyIdeas.no Fact Sheet for Healthcare Providers: BankingDealers.co.za This test is not yet approved or cleared by the [place] FDA and has been authorized for detection and/or diagnosis of SARS-CoV-2 by FDA under an Emergency  Use Authorization (EUA).  This EUA will remain in effect (meaning this test can be used) for the duration of the COVID-19 declaration under Section 564(b)(1) of the Act, 21 U.S.C. section 360bbb-3(b)(1), unless the authorization is terminated or revoked sooner. Performed at [facility], [place] [address], [place], [place] [address]          Radiology Studies: No results found.    Scheduled Meds: . apixaban  5 mg Oral BID  . aspirin  81 mg Oral Daily  . budesonide (PULMICORT) nebulizer solution  0.25 mg Nebulization BID  . carvedilol  3.125 mg Oral BID WC  . chlorhexidine  15 mL Mouth Rinse BID  . ferrous sulfate  325 mg Oral BID  . furosemide  80 mg Intravenous BID  . gabapentin  300 mg Oral BID  . guaiFENesin  600 mg Oral BID  . insulin aspart protamine- aspart  10 Units Subcutaneous BID WC  . mouth rinse  15 mL Mouth Rinse q12n4p  . mometasone-formoterol  2 puff Inhalation BID  . naLOXone (NARCAN)  injection  0.4 mg Intravenous Once  . oxybutynin  5 mg Oral QID  . pantoprazole  40 mg Oral BID  . pneumococcal 23 valent vaccine  0.5 mL Intramuscular Tomorrow-1000  . rosuvastatin  20 mg Oral q1800  . sodium chloride flush  3 mL Intravenous Q12H  . tamsulosin  0.4 mg Oral QPC supper   Continuous Infusions: . sodium chloride       LOS: 3 days      Debbe Odea, MD [facility] Pager: [email or web address] Password [facility]  07/11/2019, 10:23 AM

## 2019-07-12 DIAGNOSIS — Z7189 Other specified counseling: Secondary | ICD-10-CM

## 2019-07-12 DIAGNOSIS — Z515 Encounter for palliative care: Secondary | ICD-10-CM

## 2019-07-12 DIAGNOSIS — R5383 Other fatigue: Secondary | ICD-10-CM

## 2019-07-12 LAB — BLOOD GAS, ARTERIAL
Acid-Base Excess: 16.2 mmol/L — ABNORMAL HIGH (ref 0.0–2.0)
Bicarbonate: 42.1 mmol/L — ABNORMAL HIGH (ref 20.0–28.0)
Drawn by: 365271
O2 Content: 5 L/min
O2 Saturation: 89.4 %
Patient temperature: 98.6
pCO2 arterial: 71.6 mmHg (ref 32.0–48.0)
pH, Arterial: 7.387 (ref 7.350–7.450)
pO2, Arterial: 59.9 mmHg — ABNORMAL LOW (ref 83.0–108.0)

## 2019-07-12 LAB — BASIC METABOLIC PANEL
Anion gap: 11 (ref 5–15)
BUN: 70 mg/dL — ABNORMAL HIGH (ref 8–23)
CO2: 39 mmol/L — ABNORMAL HIGH (ref 22–32)
Calcium: 8.8 mg/dL — ABNORMAL LOW (ref 8.9–10.3)
Chloride: 90 mmol/L — ABNORMAL LOW (ref 98–111)
Creatinine, Ser: 1.98 mg/dL — ABNORMAL HIGH (ref 0.61–1.24)
GFR calc Af Amer: 38 mL/min — ABNORMAL LOW (ref >60)
GFR calc non Af Amer: 33 mL/min — ABNORMAL LOW (ref >60)
Glucose, Bld: 124 mg/dL — ABNORMAL HIGH (ref 70–99)
Potassium: 4.4 mmol/L (ref 3.5–5.1)
Sodium: 140 mmol/L (ref 135–145)

## 2019-07-12 LAB — GLUCOSE, CAPILLARY
Glucose-Capillary: 132 mg/dL — ABNORMAL HIGH (ref 70–99)
Glucose-Capillary: 151 mg/dL — ABNORMAL HIGH (ref 70–99)
Glucose-Capillary: 156 mg/dL — ABNORMAL HIGH (ref 70–99)
Glucose-Capillary: 137 mg/dL — ABNORMAL HIGH (ref 70–99)

## 2019-07-12 LAB — MAGNESIUM: Magnesium: 2.4 mg/dL (ref 1.7–2.4)

## 2019-07-12 MED ORDER — ALUM & MAG HYDROXIDE-SIMETH 200-200-20 MG/5ML PO SUSP
30.0000 mL | ORAL | Status: DC | PRN
  Administered 2019-07-12 – 2019-07-22 (×21): 30 mL via ORAL
  Filled 2019-07-12 (×22): qty 30

## 2019-07-12 NOTE — Progress Notes (Signed)
RN paged MD about ABG. Patient has been refusing BIPAP at night. Palliative has been consulted per RN. Patient is not in distress. Responds approprietly. SPO2 93 on nasal cannula. RT will continue to monitor.

## 2019-07-12 NOTE — Progress Notes (Addendum)
Critical ABG results given to RN. Stated she will call MD with results.    Results for OLUWAFERANMI, WAIN (MRN [id number]) as of 07/12/2019 10:44  Ref. Range 07/12/2019 10:21  Sample type Unknown ARTERIAL DRAW  Delivery systems Unknown NASAL CANNULA  O2 Content Latest Units: L/min 5.0  pH, Arterial Latest Ref Range: 7.350 - 7.450  7.387  pCO2 arterial Latest Ref Range: 32.0 - 48.0 mmHg 71.6 (HH)  pO2, Arterial Latest Ref Range: 83.0 - 108.0 mmHg 59.9 (L)  Acid-Base Excess Latest Ref Range: 0.0 - 2.0 mmol/L 16.2 (H)  Bicarbonate Latest Ref Range: 20.0 - 28.0 mmol/L 42.1 (H)  O2 Saturation Latest Units: % 89.4  Patient temperature Unknown 98.6  Collection site Unknown RIGHT RADIAL  Allens test (pass/fail) Latest Ref Range: PASS  PASS

## 2019-07-12 NOTE — Procedures (Signed)
Visited patient twice regarding the Bipap.  The first time patient wanted to wait until after midnight.  When this RT went back the patient declined to wear the Bipap at all.

## 2019-07-12 NOTE — Progress Notes (Signed)
PROGRESS NOTE    Nicholas Caldwell   PTW:[id number]  DOB: 1946/10/19  DOA: 07/07/2019 PCP: [facility]   Brief Narrative:  Nicholas Caldwell   is a 73 y.o. male who is wheelchair bound  w hypertension, hyperlipidemia, Pafib, CAD, CHF (EF 40-45%) , Dm2, w neuropathy, Copd on home o2 3L [place],  OSA, w recent admission 06/08/2019 for acute on chronic respiratory failure secondary to CHF presents with dyspnea for the past 3 week, as well as weight gain of about 15lbs per pt.  His breathing has been worse for the past 3 days.  Pulse ox was 74% on room air.  CXR > Cardiomegaly with interstitial edema and layering effusions. Pro BNP. 2,211.9 Troponin 37 He was last admitted from 8/5-8/10 for CHF exacerbation.  He developed acute respiratory failure on 9/5 and required BiPAP x24 hours.  I had an extensive discussion with him about wearing his CPAP at night every night.  He declined his CPAP again last night and took 10 mg of oxycodone this morning after which he feels dizzy. I feel that we need to consult palliative care at this point as he continues to be noncompliant and is high risk for repeatedly being admitted for acute respiratory failure.    Subjective: States he is feeling dizzy today and I have noted that his weight has gone up.  Notes in the computer states that he refused BiPAP again last night.  He states that is not the reason he is dizzy.  The nurse notes that he just received 2 pain pills before his complaint of dizziness.  Assessment & Plan:   Principal Problem:   Acute on chronic diastolic CHF and RV failure patient with severe COPD, obesity hypoventilation and obstructive sleep apnea - last weight was 116 kg on 8/10- now is 128 kg - on Demadex at home which was "not working"- he will need a higher dose when discharged again - cont to diurese with IV Lasix until dyspnea resolves- he is improving but remains short of breath - EF 40-45 %, diffuse LV hypokinesis, RV was severely  enlarged - current ECHO shows EF has improved to 60% and RV function mildly reduced - current exacerbation is likely due to diastolic failure - weight is up today- he refused BiPAP/CPAP last night-I feel that we need to consult palliative care at this point as he continues to be noncompliant and is high risk for repeatedly being admitted for acute respiratory failure.  Hypercapnic respiratory failure -Noted on 9/5 to be lethargic Stat ABG : pH 7.19, pCO2 108, PO2 73  (on 3 L O2) He was given 10 mg of Oxycodone a few hours before I saw him and thus I ordered Narcan  - he did awaken a little Bipap started and transferred to SDU -Hypercapnia has resolved with BiPAP x 24 hrs -See above in regards to noncompliance with nightly CPAP/BiPAP  Morbid obesity  OHS/OSA Body mass index is 36.93 kg/m. -Continue CPAP -he admits that he does not wear his CPAP at least 3 nights out of the week which may be causing his CHF exacerbations- we have had a discussion about this but he continues to be resistant to wearing it  Chronic pedal edema - He wears Unna boots that were changed on 9/6  Hypotension  - BP was in low 100s- follow closely while diuresing -Holding BiDil  PAF - Was seen in past by Dr. Rayann Heman, not thought to be good ablation candidate. He has not been on  Tikosyn or amiodarone due to history of long QT.  - Continue Eliquis.  - in sinus rhythm with short episodes of v tach  V tach on 9/4 - likely due to CHF exacerbation   QTc checked and was no longer high>  480- started low dose Coreg- V tach has resolved- last episode was on 9/5 -Repeated EKG on 9/7-QTC was 401  Chronic pain - will carefully resume narcotics, as his CHF exacerbation is improving and as long as he wears his CPAP at night, he should not have CO2 retention -As he is not wearing his CPAP at night I will cut his narcotics back down to 5 mg- he will need to be changed to DNR if he is to continue taking high-dose narcotics  and not using his CPAP    COPD Severe, on 3L home oxygen.   Chronic kidney disease stage III-IV - follow with diuresis-      Type 2 diabetes mellitus with neuropathy - cont 70/30 - sugars controlled    Elevated troponin I level - mildly elevated high-sensitivity troponin at 30-49 range  Time spent in minutes: 35  DVT prophylaxis: Eliquis Code Status: Full code Family Communication:  Disposition Plan: home-he is wheelchair-bound Consultants:   none Procedures:  2 D ECHO 1. The left ventricle has hyperdynamic systolic function, with an ejection fraction of >65%. The cavity size was normal. There is mildly increased left ventricular wall thickness. Left ventricular diastolic Doppler parameters are indeterminate. No  evidence of left ventricular regional wall motion abnormalities.  2. The right ventricle has mildly reduced systolic function. The cavity was moderately enlarged. There is not assessed.  3. Left atrial size was mildly dilated.  4. Right atrial size was moderately dilated.  5. The aortic valve is tricuspid. No stenosis of the aortic valve.  6. The aorta is not well visualized unless otherwise noted.  7. The inferior vena cava was dilated in size with <50% respiratory variability.  8. The interatrial septum was not well visualized. Antimicrobials:  Anti-infectives (From admission, onward)   None       Objective: Vitals:   07/12/19 0749 07/12/19 0807 07/12/19 0826 07/12/19 1119  BP: 116/60   (!) 115/52  Pulse: 92   62  Resp: (!) 30   15  Temp: 98.5 F (36.9 C)   98 F (36.7 C)  TempSrc: Oral   Oral  SpO2: 95% 97%  90%  Weight:   126.4 kg   Height:        Intake/Output Summary (Last 24 hours) at 07/12/2019 1211 Last data filed at 07/12/2019 0751 Gross per 24 hour  Intake 920 ml  Output 2300 ml  Net -1380 ml   Filed Weights   07/11/19 0800 07/12/19 0300 07/12/19 0826  Weight: 123.5 kg 123.6 kg 126.4 kg    Examination: General exam: Appears  comfortable  HEENT: PERRLA, oral mucosa moist, no sclera icterus or thrush Respiratory system: Clear to auscultation. Respiratory effort normal. Cardiovascular system: S1 & S2 heard,  No murmurs  Gastrointestinal system: Abdomen soft, non-tender, nondistended. Normal bowel sounds   Central nervous system: Alert and oriented. No focal neurological deficits. Extremities: No cyanosis, clubbing or edema Skin: No rashes or ulcers Psychiatry:  Mood & affect appropriate. .   Data Reviewed: I have personally reviewed following labs and imaging studies  CBC: Recent Labs  Lab 07/07/19 1803 07/08/19 0530 07/10/19 0756  WBC 5.6 5.7 5.3  NEUTROABS 4.4  --   --   HGB  9.6* 9.1* 9.1*  HCT 32.5* 30.4* 32.2*  MCV 85.1 84.9 88.7  PLT 355 346 063   Basic Metabolic Panel: Recent Labs  Lab 07/08/19 0530 07/09/19 0601 07/10/19 0756 07/11/19 0215 07/12/19 0301  NA 136 133* 139 138 140  K 3.8 5.7* 5.6* 4.7 4.4  CL 86* 85* 90* 90* 90*  CO2 39* 34* 37* 38* 39*  GLUCOSE 149* 140* 100* 138* 124*  BUN 62* 74* 74* 77* 70*  CREATININE 2.45* 2.69* 2.14* 2.28* 1.98*  CALCIUM 8.6* 8.6* 8.9 8.6* 8.8*  MG  --   --   --   --  2.4   GFR: Estimated Creatinine Clearance: 47 mL/min (A) (by C-G formula based on SCr of 1.98 mg/dL (H)). Liver Function Tests: Recent Labs  Lab 07/07/19 1803 07/08/19 0530  AST 23 16  ALT 12 10  ALKPHOS 91 86  BILITOT 0.3 0.5  PROT 6.9 6.4*  ALBUMIN 3.1* 2.8*   No results for input(s): LIPASE, AMYLASE in the last 168 hours. No results for input(s): AMMONIA in the last 168 hours. Coagulation Profile: No results for input(s): INR, PROTIME in the last 168 hours. Cardiac Enzymes: No results for input(s): CKTOTAL, CKMB, CKMBINDEX, TROPONINI in the last 168 hours. BNP (last 3 results) No results for input(s): PROBNP in the last 8760 hours. HbA1C: No results for input(s): HGBA1C in the last 72 hours. CBG: Recent Labs  Lab 07/11/19 1104 07/11/19 1557 07/11/19 2056  07/12/19 0624 07/12/19 1127  GLUCAP 197* 202* 154* 132* 151*   Lipid Profile: No results for input(s): CHOL, HDL, LDLCALC, TRIG, CHOLHDL, LDLDIRECT in the last 72 hours. Thyroid Function Tests: No results for input(s): TSH, T4TOTAL, FREET4, T3FREE, THYROIDAB in the last 72 hours. Anemia Panel: No results for input(s): VITAMINB12, FOLATE, FERRITIN, TIBC, IRON, RETICCTPCT in the last 72 hours. Urine analysis:    Component Value Date/Time   COLORURINE STRAW (A) 07/08/2019 0008   APPEARANCEUR CLEAR 07/08/2019 0008   LABSPEC 1.006 07/08/2019 0008   PHURINE 6.0 07/08/2019 0008   GLUCOSEU NEGATIVE 07/08/2019 0008   HGBUR NEGATIVE 07/08/2019 0008   BILIRUBINUR NEGATIVE 07/08/2019 0008   KETONESUR NEGATIVE 07/08/2019 0008   PROTEINUR NEGATIVE 07/08/2019 0008   UROBILINOGEN 0.2 10/26/2014 2206   NITRITE NEGATIVE 07/08/2019 0008   LEUKOCYTESUR NEGATIVE 07/08/2019 0008   Sepsis Labs: _0 (procalcitonin:4,lacticidven:4) ) Recent Results (from the past 240 hour(s))  SARS Coronavirus 2 [facility] order, Performed in [facility] hospital lab) Nasopharyngeal Nasopharyngeal Swab     Status: None   Collection Time: 07/07/19  6:07 PM   Specimen: Nasopharyngeal Swab  Result Value Ref Range Status   SARS Coronavirus 2 NEGATIVE NEGATIVE Final    Comment: (NOTE) If result is NEGATIVE SARS-CoV-2 target nucleic acids are NOT DETECTED. The SARS-CoV-2 RNA is generally detectable in upper and lower  respiratory specimens during the acute phase of infection. The lowest  concentration of SARS-CoV-2 viral copies this assay can detect is 250  copies / mL. A negative result does not preclude SARS-CoV-2 infection  and should not be used as the sole basis for treatment or other  patient management decisions.  A negative result may occur with  improper specimen collection / handling, submission of specimen other  than nasopharyngeal swab, presence of viral mutation(s) within the  areas targeted by  this assay, and inadequate number of viral copies  (<250 copies / mL). A negative result must be combined with clinical  observations, patient history, and epidemiological information. If result is POSITIVE SARS-CoV-2  target nucleic acids are DETECTED. The SARS-CoV-2 RNA is generally detectable in upper and lower  respiratory specimens dur ing the acute phase of infection.  Positive  results are indicative of active infection with SARS-CoV-2.  Clinical  correlation with patient history and other diagnostic information is  necessary to determine patient infection status.  Positive results do  not rule out bacterial infection or co-infection with other viruses. If result is PRESUMPTIVE POSTIVE SARS-CoV-2 nucleic acids MAY BE PRESENT.   A presumptive positive result was obtained on the submitted specimen  and confirmed on repeat testing.  While 2019 novel coronavirus  (SARS-CoV-2) nucleic acids may be present in the submitted sample  additional confirmatory testing may be necessary for epidemiological  and / or clinical management purposes  to differentiate between  SARS-CoV-2 and other Sarbecovirus currently known to infect humans.  If clinically indicated additional testing with an alternate test  methodology [phone]) is advised. The SARS-CoV-2 RNA is generally  detectable in upper and lower respiratory sp ecimens during the acute  phase of infection. The expected result is Negative. Fact Sheet for Patients:  StrictlyIdeas.no Fact Sheet for Healthcare Providers: BankingDealers.co.za This test is not yet approved or cleared by the [place] FDA and has been authorized for detection and/or diagnosis of SARS-CoV-2 by FDA under an Emergency Use Authorization (EUA).  This EUA will remain in effect (meaning this test can be used) for the duration of the COVID-19 declaration under Section 564(b)(1) of the Act, 21 U.S.C. section  360bbb-3(b)(1), unless the authorization is terminated or revoked sooner. Performed at [facility], [place] [address], [place], [place] [address]          Radiology Studies: No results found.    Scheduled Meds: . apixaban  5 mg Oral BID  . aspirin  81 mg Oral Daily  . budesonide (PULMICORT) nebulizer solution  0.25 mg Nebulization BID  . carvedilol  3.125 mg Oral BID WC  . chlorhexidine  15 mL Mouth Rinse BID  . ferrous sulfate  325 mg Oral BID  . furosemide  80 mg Intravenous BID  . gabapentin  300 mg Oral BID  . guaiFENesin  600 mg Oral BID  . insulin aspart protamine- aspart  10 Units Subcutaneous BID WC  . mouth rinse  15 mL Mouth Rinse q12n4p  . mometasone-formoterol  2 puff Inhalation BID  . naLOXone (NARCAN)  injection  0.4 mg Intravenous Once  . oxybutynin  5 mg Oral QID  . pantoprazole  40 mg Oral BID  . pneumococcal 23 valent vaccine  0.5 mL Intramuscular Tomorrow-1000  . rosuvastatin  20 mg Oral q1800  . sodium chloride flush  3 mL Intravenous Q12H  . tamsulosin  0.4 mg Oral QPC supper   Continuous Infusions: . sodium chloride       LOS: 4 days      Debbe Odea, MD [facility] Pager: [email or web address] Password [email or web address] 07/12/2019, 12:11 PM

## 2019-07-12 NOTE — Plan of Care (Signed)
  Problem: Education: Goal: Ability to demonstrate management of disease process will improve Outcome: Progressing   Problem: Activity: Goal: Capacity to carry out activities will improve Outcome: Progressing   Problem: Cardiac: Goal: Ability to achieve and maintain adequate cardiopulmonary perfusion will improve Outcome: Progressing   

## 2019-07-12 NOTE — Progress Notes (Signed)
Resp (April) called with patients STAT ANG results. PH 7.38, O2: 59.9, Co2: 71.6, BiCarb : 42.1, Sats 89.4 on 5L [place]. Paged Dr. Wynelle Cleveland with ABG results. Patient has refused his BiPap last night. Will continue to monitor.

## 2019-07-12 NOTE — Progress Notes (Addendum)
Physical Therapy Treatment Patient Details Name: Nicholas Caldwell MRN: [id number] DOB: 04/25/1946 Today's Date: 07/12/2019    History of Present Illness 73 yo admitted with SOB with CHF exacerbation. PMHx: Afib, CAD, CHF, COPD on home O2, DM, HTN, HLD    PT Comments    Pt admitted with above diagnosis. Pt was only agreeable to get to EOB and transfer to chair.  Self limiting and would not walk even though feel that pt could have.  He did c/o dizziness and BP was a little low.  BP 113/57 on arrival and 90/63 once in chair.  Pt sats and HR stable.  Continue to progress pt as able.  Pt currently with functional limitations due to balance and endurance deficits. Pt will benefit from skilled PT to increase their independence and safety with mobility to allow discharge to the venue listed below.    Follow Up Recommendations  SNF;Supervision/Assistance - 24 hour;Home health PT(pending progression)     Equipment Recommendations  None recommended by PT    Recommendations for Other Services OT consult     Precautions / Restrictions Precautions Precautions: Fall Precaution Comments: watch sats Restrictions Weight Bearing Restrictions: No    Mobility  Bed Mobility Overal bed mobility: Needs Assistance Bed Mobility: Supine to Sit     Supine to sit: Mod assist;+2 for safety/equipment     General bed mobility comments: Needed assit for LEs and elevation of trunk to EOB.  Incr time and assist to scoot to eOB.   Transfers Overall transfer level: Needs assistance Equipment used: Rolling walker (2 wheeled) Transfers: Sit to/from Stand Sit to Stand: Min assist;+2 safety/equipment         General transfer comment: min assist with multimodal cues and increased time to lean forward and stand with bed raised considerably. pt able to stand nad pivot to chair with RW with min assist and cues.  Was standing so well, encouraged pt to walk a little but pt refused to do so.  refused exercise as well  and asked PT, "Why do you talk so much?"  Ambulation/Gait             General Gait Details: refused to ambulate   Stairs             Wheelchair Mobility    Modified Rankin (Stroke Patients Only)       Balance Overall balance assessment: Needs assistance Sitting-balance support: No upper extremity supported;Feet supported Sitting balance-Leahy Scale: Fair     Standing balance support: Bilateral upper extremity supported Standing balance-Leahy Scale: Poor Standing balance comment: Relies on UE support for standing,.                             Cognition Arousal/Alertness: Awake/alert Behavior During Therapy: Flat affect Overall Cognitive Status: Impaired/Different from baseline Area of Impairment: Orientation;Attention;Memory;Following commands;Safety/judgement;Problem solving                 Orientation Level: Disoriented to;Time;Situation Current Attention Level: Focused Memory: Decreased short-term memory Following Commands: Follows one step commands inconsistently;Follows one step commands with increased time Safety/Judgement: Decreased awareness of safety;Decreased awareness of deficits   Problem Solving: Slow processing;Requires verbal cues;Requires tactile cues        Exercises General Exercises - Lower Extremity Long Arc Quad: AROM;Both;Seated;10 reps Hip Flexion/Marching: AROM;Both;Seated;10 reps    General Comments        Pertinent Vitals/Pain Pain Assessment: No/denies pain, c/o 8/10 pain in right hip  and back once in chair.  Asked nurse for pain meds.     Home Living                      Prior Function            PT Goals (current goals can now be found in the care plan section) Acute Rehab PT Goals Patient Stated Goal: return home and watch tv Progress towards PT goals: Progressing toward goals    Frequency    Min 3X/week      PT Plan Current plan remains appropriate    Co-evaluation               AM-PAC PT "6 Clicks" Mobility   Outcome Measure  Help needed turning from your back to your side while in a flat bed without using bedrails?: A Lot Help needed moving from lying on your back to sitting on the side of a flat bed without using bedrails?: A Lot Help needed moving to and from a bed to a chair (including a wheelchair)?: A Lot Help needed standing up from a chair using your arms (e.g., wheelchair or bedside chair)?: A Little Help needed to walk in hospital room?: A Lot Help needed climbing 3-5 steps with a railing? : A Lot 6 Click Score: 13    End of Session Equipment Utilized During Treatment: Oxygen;Gait belt Activity Tolerance: Patient limited by fatigue(self limiting) Patient left: in chair;with call bell/phone within reach;with chair alarm set Nurse Communication: Mobility status, pain meds PT Visit Diagnosis: Muscle weakness (generalized) (M62.81);Other abnormalities of gait and mobility (R26.89);History of falling (Z91.81)     Time: 6067-7034 PT Time Calculation (min) (ACUTE ONLY): 18 min  Charges:  $Therapeutic Activity: 8-22 mins                     [place] Pager:  [phone]  Office:  [place] 07/12/2019, 11:04 AM

## 2019-07-12 NOTE — Consult Note (Signed)
Consultation Note Date: 07/12/2019   Patient Name: Nicholas Caldwell  DOB: [birth year]  MRN: [id number]  Age / Sex: 73 y.o., male  PCP[facility] Referring Physician: Debbe Odea, MD  Reason for Consultation: Establish GOC   HPI/Patient Profile: 73 y.o. male  admitted on 07/07/2019 with past medical history of hypertension, hyperlipidemia, afib, CAD, CHF (EF 40-45%) , DM with peripheral  neuropathy, COPD, OSA, w recent admission 06/10/2019 for acute on chronic respiratory failure secondary to CHF.  Admitted with  c/o increased dyspnea, as well as weight gain of about 15lbs, cough and  lower ext edema.  CXR IMPRESSION: Cardiomegaly with interstitial edema and layering effusions. BNP--2211.0    Creatine 2.4  Multiple hospital admission over the past six months.  Patient and his family face treatment option decisions, advanced directive decisions and anticipatory care needs associated with seriousness life limiting disease   Clinical Assessment and Goals of Care:   This NP Wadie Lessen reviewed medical records, received report from team, assessed the patient and then meet at the patient's bedside  to discuss diagnosis, prognosis, GOC,  disposition and options.  Concept of Hospice and Palliative Care were discussed.    I spent a lot of time explaining the difference between palliative and hospice.  I explained PMT was consulted to help him and his family/exra layer of support with navigating his health care decisions and addressing questions and concerns.  Patient was focused with the question of "am I dying",he has  little insight into the complexity of his disease.  Created space and opportunity for him to explore his thoughts and feelings regarding his current medical and physical situation (continued physical decline)   Values and goals of care important to patient were attempted to be elicited.    Questions and concerns addressed.     PMT will continue to support holistically.  Plan is to meet tomorrow at noon to continue conversation with his wife at the bedside.    No documented HPOA or AD  NEXT OF KIN    SUMMARY OF RECOMMENDATIONS    Code Status/Advance Care Planning:  Full code   Palliative Prophylaxis:   Delirium Protocol, Frequent Pain Assessment and Oral Care  Additional Recommendations (Limitations, Scope, Preferences):  Full Scope Treatment  Psycho-social/Spiritual:   Desire for further Chaplaincy support:no Additional Recommendations:  Emotional support offered  Prognosis:   Unable to determine  Discharge Planning: To Be Determined      Primary Diagnoses: Present on Admission: . Essential hypertension . Elevated troponin I level . PAF (paroxysmal atrial fibrillation) ([place]) . Acute on chronic combined systolic and diastolic CHF (congestive heart failure) ([place])   I have reviewed the medical record, interviewed the patient and family, and examined the patient. The following aspects are pertinent.  Past Medical History:  Diagnosis Date  . Atrial flutter ([place])    a. recurrent AFlutter with RVR;  b. Amiodarone Rx started 4/16  . CAD (coronary artery disease)    a. LHC 1/16:  mLAD diffuse disease, pLCx mild  disease, dLCx with disease but too small for PCI, RCA ok, EF 25-30%  . Chronic pain   . Chronic systolic CHF (congestive heart failure) ([place])   . COPD (chronic obstructive pulmonary disease) ([place])   . Diabetes mellitus without complication ([place])   . Hypercholesteremia   . Hypertension   . NICM (nonischemic cardiomyopathy) ([place])    a.dx 2016. b. 2D echo 06/2016 - Last echo 07/01/16: mod dilated LV, mod LVH, EF 25-30%, mild-mod MR, sev LAE, mild-mod reduced RV systolic function, mild-mod TR, PASP 8mHG.  .Marland KitchenPAF (paroxysmal atrial fibrillation) (HCC)    On amio - ot a candidate for flecainide due to cardiomyopathy, not a candidate for  Tikosyn due to prolonged QT, and felt to be a poor candidate for ablation given left atrial size.  . Pulmonary hypertension ([place]   . Tobacco abuse    Social History   Socioeconomic History  . Marital status: Married    Spouse name: Not on file  . Number of children: Not on file  . Years of education: Not on file  . Highest education level: Not on file  Occupational History  . Not on file  Social Needs  . Financial resource strain: Not on file  . Food insecurity    Worry: Not on file    Inability: Not on file  . Transportation needs    Medical: Not on file    Non-medical: Not on file  Tobacco Use  . Smoking status: Former Smoker    Packs/day: 1.00    Years: 34.00    Pack years: 34.00    Types: Cigarettes    Quit date: 11/30/2018    Years since quitting: 0.6  . Smokeless tobacco: Never Used  Substance and Sexual Activity  . Alcohol use: No    Alcohol/week: 0.0 standard drinks  . Drug use: No  . Sexual activity: Not on file  Lifestyle  . Physical activity    Days per week: Not on file    Minutes per session: Not on file  . Stress: Not on file  Relationships  . Social [place] phone: Not on file    Gets together: Not on file    Attends religious service: Not on file    Active member of club or organization: Not on file    Attends meetings of clubs or organizations: Not on file    Relationship status: Not on file  Other Topics Concern  . Not on file  Social History Narrative  . Not on file   Family History  Problem Relation Age of Onset  . Heart disease Mother   . Hypertension Mother   . Heart failure Mother   . Heart disease Father   . Hypertension Sister   . Heart attack Neg Hx   . Stroke Neg Hx   . Colon cancer Neg Hx   . Esophageal cancer Neg Hx    Scheduled Meds: . apixaban  5 mg Oral BID  . aspirin  81 mg Oral Daily  . budesonide (PULMICORT) nebulizer solution  0.25 mg Nebulization BID  . carvedilol  3.125 mg Oral BID WC  .  chlorhexidine  15 mL Mouth Rinse BID  . ferrous sulfate  325 mg Oral BID  . furosemide  80 mg Intravenous BID  . gabapentin  300 mg Oral BID  . guaiFENesin  600 mg Oral BID  . insulin aspart protamine- aspart  10 Units Subcutaneous BID WC  . mouth rinse  15 mL Mouth Rinse q12n4p  . mometasone-formoterol  2 puff Inhalation BID  . naLOXone (NARCAN)  injection  0.4 mg Intravenous Once  . oxybutynin  5 mg Oral QID  . pantoprazole  40 mg Oral BID  . pneumococcal 23 valent vaccine  0.5 mL Intramuscular Tomorrow-1000  . rosuvastatin  20 mg Oral q1800  . sodium chloride flush  3 mL Intravenous Q12H  . tamsulosin  0.4 mg Oral QPC supper   Continuous Infusions: . sodium chloride     PRN Meds:.sodium chloride, acetaminophen **OR** acetaminophen, fluticasone, ipratropium, levalbuterol, oxyCODONE-acetaminophen **AND** oxyCODONE, polyethylene glycol, sodium chloride, sodium chloride flush Medications Prior to Admission:  Prior to Admission medications   Medication Sig Start Date End Date Taking? Authorizing Provider  albuterol (VENTOLIN HFA) 108 (90 Base) MCG/ACT inhaler Inhale 2 puffs into the lungs every 6 (six) hours as needed for wheezing or shortness of breath.   Yes [provider]  apixaban (ELIQUIS) 5 MG TABS tablet Take 1 tablet (5 mg total) by mouth 2 (two) times daily. 03/31/19  Yes Kayleen Memos, DO  aspirin EC 81 MG EC tablet Take 1 tablet (81 mg total) by mouth daily. 06/14/19  Yes Swayze, Ava, DO  budesonide-formoterol (SYMBICORT) 160-4.5 MCG/ACT inhaler Inhale 2 puffs into the lungs as needed (shortness of breath).    Yes [provider]  ferrous sulfate 325 (65 FE) MG tablet Take 325 mg by mouth 2 (two) times daily.   Yes [provider]  fluticasone (FLONASE) 50 MCG/ACT nasal spray Place 2 sprays into both nostrils daily as needed for allergies.    Yes [provider]  gabapentin (NEURONTIN) 300 MG capsule Take 1 capsule (300 mg total) by mouth 2  (two) times daily. 06/13/19  Yes Swayze, Ava, DO  hydrocortisone (ANUSOL-HC) 2.5 % rectal cream Place 1 application rectally 2 (two) times daily as needed for hemorrhoids or itching.    Yes [provider]  hydrocortisone 1 % lotion Apply 1 application topically See admin instructions. Apply small amount to back twice daily for itchy rash as needed   Yes [provider]  insulin aspart (NOVOLOG) 100 UNIT/ML injection Inject 4 Units into the skin 3 (three) times daily with meals. 03/31/19  Yes Hall, Carole N, DO  insulin aspart protamine - aspart (NOVOLOG MIX 70/30 FLEXPEN) (70-30) 100 UNIT/ML FlexPen Inject 0.1 mLs (10 Units total) into the skin 2 (two) times daily. 03/31/19  Yes Hall, Carole N, DO  ipratropium-albuterol (DUONEB) 0.5-2.5 (3) MG/3ML SOLN Take 3 mLs by nebulization 2 (two) times daily as needed (shortness of breath/wheezing).    Yes [provider]  isosorbide-hydrALAZINE (BIDIL) 20-37.5 MG tablet Take 0.5 tablets by mouth 3 (three) times daily. 05/18/19  Yes Larey Dresser, MD  metolazone (ZAROXOLYN) 2.5 MG tablet Take 1 tablet (2.5 mg total) by mouth 4 (four) times a week. Take extra Potassium 12mq when taking this medication 06/30/19  Yes MLarey Dresser MD  oxybutynin (DITROPAN) 5 MG tablet Take 1 tablet (5 mg total) by mouth 4 (four) times daily. 06/13/19  Yes Swayze, Ava, DO  oxyCODONE-acetaminophen (PERCOCET) 10-325 MG tablet Take 1 tablet by mouth every 6 (six) hours as needed for pain.  05/16/19  Yes [provider]  OXYGEN Inhale 3 L into the lungs continuous.    Yes [provider]  pantoprazole (PROTONIX) 40 MG tablet Take 1 tablet (40 mg total) by mouth 2 (two) times daily. 05/05/19  Yes Clegg, Amy D, NP  polyethylene glycol (MIRALAX / GLYCOLAX) packet Take 17 g by mouth daily. Patient taking differently: Take 17 g by mouth daily as needed (constipation).  01/24/19  Yes Angiulli, Lavon Paganini, PA-C  potassium chloride SA (K-DUR) 20 MEQ  tablet Take 3 tablets (60 mEq total) by mouth 2 (two) times daily. With additional 63mq when you take Metolazone. 06/13/19  Yes Swayze, Ava, DO  PRESCRIPTION MEDICATION Cpap   Yes [provider]  rosuvastatin (CRESTOR) 20 MG tablet Take 1 tablet (20 mg total) by mouth daily at 6 PM. 05/18/19  Yes MLarey Dresser MD  sodium chloride (OCEAN) 0.65 % SOLN nasal spray Place 2 sprays into both nostrils as needed for congestion.    Yes [provider]  tamsulosin (FLOMAX) 0.4 MG CAPS capsule Take 1 capsule (0.4 mg total) by mouth daily after supper. 04/20/19  Yes Clegg, Amy D, NP  torsemide (DEMADEX) 20 MG tablet Take 4 tablets (80 mg total) by mouth 2 (two) times daily. 06/23/19  Yes MLarey Dresser MD   No Known Allergies Review of Systems  Constitutional: Positive for fatigue.  Neurological: Positive for weakness.    Physical Exam Constitutional:      Appearance: He is overweight. He is ill-appearing.     Interventions: Nasal cannula in place.  Cardiovascular:     Rate and Rhythm: Normal rate.  Pulmonary:     Effort: Pulmonary effort is normal.  Skin:    General: Skin is warm and dry.  Neurological:     Mental Status: He is alert and oriented to person, place, and time.     Vital Signs: BP 116/60 (BP Location: Left Arm)   Pulse 92   Temp 98.5 F (36.9 C) (Oral)   Resp (!) 30   Ht 6' (1.829 m)   Wt 126.4 kg   SpO2 97%   BMI 37.79 kg/m  Pain Scale: 0-10 POSS *See Group Information*: 1-Acceptable,Awake and alert Pain Score: 3    SpO2: SpO2: 97 % O2 Device:SpO2: 97 % O2 Flow Rate: .O2 Flow Rate (L/min): 5 L/min  IO: Intake/output summary:   Intake/Output Summary (Last 24 hours) at 07/12/2019 1121 Last data filed at 07/12/2019 0751 Gross per 24 hour  Intake 1140 ml  Output 2300 ml  Net -1160 ml    LBM: Last BM Date: 07/10/19 Baseline Weight: Weight: 128.8 kg Most recent weight: Weight: 126.4 kg     Palliative Assessment/Data:  30%      Time In:  1100 Time Out: 1210 Time Total: 70 minutes Greater than 50%  of this time was spent counseling and coordinating care related to the above assessment and plan.  Signed by: MWadie Lessen NP   Please contact Palliative Medicine Team phone at [phone] questions and concerns.  For individual provider: See AShea Evans

## 2019-07-13 LAB — BASIC METABOLIC PANEL
Anion gap: 10 (ref 5–15)
BUN: 63 mg/dL — ABNORMAL HIGH (ref 8–23)
CO2: 40 mmol/L — ABNORMAL HIGH (ref 22–32)
Calcium: 8.6 mg/dL — ABNORMAL LOW (ref 8.9–10.3)
Chloride: 91 mmol/L — ABNORMAL LOW (ref 98–111)
Creatinine, Ser: 1.84 mg/dL — ABNORMAL HIGH (ref 0.61–1.24)
GFR calc Af Amer: 42 mL/min — ABNORMAL LOW (ref >60)
GFR calc non Af Amer: 36 mL/min — ABNORMAL LOW (ref >60)
Glucose, Bld: 120 mg/dL — ABNORMAL HIGH (ref 70–99)
Potassium: 4 mmol/L (ref 3.5–5.1)
Sodium: 141 mmol/L (ref 135–145)

## 2019-07-13 LAB — GLUCOSE, CAPILLARY
Glucose-Capillary: 124 mg/dL — ABNORMAL HIGH (ref 70–99)
Glucose-Capillary: 109 mg/dL — ABNORMAL HIGH (ref 70–99)
Glucose-Capillary: 155 mg/dL — ABNORMAL HIGH (ref 70–99)
Glucose-Capillary: 153 mg/dL — ABNORMAL HIGH (ref 70–99)

## 2019-07-13 NOTE — Progress Notes (Signed)
PROGRESS NOTE    Nicholas Caldwell   PXT:[id number]  DOB: 24-Jun-1946  DOA: 07/07/2019 [facility]: [facility]   Brief Narrative:  Nicholas Caldwell   is a 73 y.o. male who is wheelchair bound  w hypertension, hyperlipidemia, Paroxysmal afib, CAD, CHF (EF 40-45%) , Dm2, w neuropathy, Copd on home o2 3L [place],  OSA, w recent admission 06/08/2019 for acute on chronic respiratory failure secondary to CHF presents with dyspnea for the past 3 week, as well as weight gain of about 15lbs per pt. His breathing has been worse for the past 3 days. Pulse ox was 74% on room air. CXR > Cardiomegaly with interstitial edema and layering effusions. Pro BNP. 2,211.9 Troponin 37. He was last admitted from 8/5-8/10 for CHF exacerbation.  He developed acute respiratory failure on 9/5 and required BiPAP x24 hours.  I had an extensive discussion with him about wearing his CPAP at night every night.  He declined his CPAP again last night and took 10 mg of oxycodone this morning after which he feels dizzy.   Subjective: Patient continues to complain of shortness of breath and swelling but notes he feels much improved from admission.  Patient indicates he is been compliant with medications and BiPAP, nurse at bedside indicates patient has been noncompliant with BiPAP however.  Assessment & Plan: Principal Problem:   Acute on chronic combined systolic and diastolic CHF (congestive heart failure) (HCC) Active Problems:   Essential hypertension   Type 2 diabetes mellitus with neuropathy   Elevated troponin I level   PAF (paroxysmal atrial fibrillation) (HCC)   Acute CHF (congestive heart failure) (HCC)    Acute on chronic diastolic CHF and RV failure patient with severe COPD, obesity hypoventilation and obstructive sleep apnea -Patient's baseline oxygen is 3 L nasal cannula around-the-clock, continues to require 5 L nasal cannula and BiPAP here for support -Last weight was 116 kg on 8/10- now is 125 kg -Patient on Demadex at  home which was "not working"- he will need a higher dose when discharged again - cont to diurese with 80 mg IV Lasix twice daily until euvolemic and symptoms have resolved- EF 40-45 %, diffuse LV hypokinesis, RV was severely enlarged -Continues to diurese well I's and O's net negative nearly 3 L since admission - current ECHO shows EF has improved to 60% and RV function mildly reduced - current exacerbation is likely due to diastolic failure -Lengthy discussion at bedside about patient noncompliance, patient indicates he is doing "everything he supposed to"at home but when pressed patient does not really maintain I's and O's, salt restriction or fluid restriction.  We discussed that should patient continue to be markedly noncompliant with his regimen he would likely only worsen over the next few months with markedly increased morbidity mortality.  Acute hypoxia on chronic hypoxic respiratory failure Concurrent hypercapnic respiratory failure likely in the setting chronic COPD and acute HF exacerbation. -Baseline oxygen requirements 3 L nasal cannula around-the-clock, maintains 5 L nasal cannula and BiPAP to maintain O2 above 90% even at rest  -Somewhat lethargic on 07/09/2019, CO2 noted to be 108 -minimal improvement with Narcan - improved with BiPap/Cpap  COPD with chronic hypoxia, not in acute exacerbation - Severe, on 3L home oxygen at rest.   Morbid obesity  OHS/OSA Body mass index is 36.93 kg/m. -Continue CPAP -he admits that he does not wear his CPAP at least 3 nights out of the week which may be causing his CHF exacerbations- we have had a discussion  about this but he continues to be resistant to wearing it  Chronic pedal edema - He wears Unna boots that were changed on 9/6  Hypotension  - BP was in low 100s- follow closely while diuresing - Holding BiDil in the mean time while diuresing  PAF, Chadsvasc 7 - Was seen in past by Dr. Rayann Heman, not thought to be good ablation candidate.  He has not been on Tikosyn or amiodarone due to history of long QT.  - Continue Eliquis.  - in sinus rhythm with short episodes of v tach  V tach on 9/4 - likely due to CHF exacerbation   QTc checked and was no longer high>  480- started low dose Coreg- V tach has resolved- last episode was on 9/5 -Repeated EKG on 9/7-QTC was 401  Chronic pain -Continue home oxycodone at 5 mg (reduced dose given hypercarbia previously)  Chronic kidney disease stage III-IV - follow with diuresis - creatinine downtrending appropriately   Type 2 diabetes mellitus with neuropathy - cont 70/30 - sugars controlled  DVT prophylaxis: Eliquis Code Status: Full code Family Communication:  Disposition Plan: home-he is wheelchair-bound due to weakness/obesity -tentative disposition home pending clinical resolution and provement in hypoxia as above  Consultants:   None  Procedures:  2 D ECHO 1. The left ventricle has hyperdynamic systolic function, with an ejection fraction of >65%. The cavity size was normal. There is mildly increased left ventricular wall thickness. Left ventricular diastolic Doppler parameters are indeterminate. No  evidence of left ventricular regional wall motion abnormalities.  2. The right ventricle has mildly reduced systolic function. The cavity was moderately enlarged. There is not assessed.  3. Left atrial size was mildly dilated.  4. Right atrial size was moderately dilated.  5. The aortic valve is tricuspid. No stenosis of the aortic valve.  6. The aorta is not well visualized unless otherwise noted.  7. The inferior vena cava was dilated in size with <50% respiratory variability.  8. The interatrial septum was not well visualized.  Antimicrobials:  Anti-infectives (From admission, onward)   None     Objective: Vitals:   07/13/19 0438 07/13/19 0552 07/13/19 0637 07/13/19 0725  BP: (!) 112/55  101/64 101/64  Pulse: 71  77 86  Resp: 20   (!) 26  Temp:      TempSrc:       SpO2: 92%   90%  Weight:  125.5 kg    Height:        Intake/Output Summary (Last 24 hours) at 07/13/2019 0737 Last data filed at 07/13/2019 0551 Gross per 24 hour  Intake 1080 ml  Output 2450 ml  Net -1370 ml   Filed Weights   07/12/19 0300 07/12/19 0826 07/13/19 0552  Weight: 123.6 kg 126.4 kg 125.5 kg    Examination: General exam: Appears comfortable  HEENT: PERRLA, oral mucosa moist, no sclera icterus or thrush Respiratory system: Clear to auscultation. Respiratory effort normal. Cardiovascular system: S1 & S2 heard,  No murmurs  Gastrointestinal system: Abdomen soft, non-tender, nondistended. Normal bowel sounds   Central nervous system: Alert and oriented. No focal neurological deficits. Extremities: No cyanosis, clubbing or edema Skin: No rashes or ulcers Psychiatry:  Mood & affect appropriate. .   Data Reviewed: I have personally reviewed following labs and imaging studies  CBC: Recent Labs  Lab 07/07/19 1803 07/08/19 0530 07/10/19 0756  WBC 5.6 5.7 5.3  NEUTROABS 4.4  --   --   HGB 9.6* 9.1* 9.1*  HCT  32.5* 30.4* 32.2*  MCV 85.1 84.9 88.7  PLT 355 346 151   Basic Metabolic Panel: Recent Labs  Lab 07/09/19 0601 07/10/19 0756 07/11/19 0215 07/12/19 0301 07/13/19 0241  NA 133* 139 138 140 141  K 5.7* 5.6* 4.7 4.4 4.0  CL 85* 90* 90* 90* 91*  CO2 34* 37* 38* 39* 40*  GLUCOSE 140* 100* 138* 124* 120*  BUN 74* 74* 77* 70* 63*  CREATININE 2.69* 2.14* 2.28* 1.98* 1.84*  CALCIUM 8.6* 8.9 8.6* 8.8* 8.6*  MG  --   --   --  2.4  --    GFR: Estimated Creatinine Clearance: 50.4 mL/min (A) (by C-G formula based on SCr of 1.84 mg/dL (H)). Liver Function Tests: Recent Labs  Lab 07/07/19 1803 07/08/19 0530  AST 23 16  ALT 12 10  ALKPHOS 91 86  BILITOT 0.3 0.5  PROT 6.9 6.4*  ALBUMIN 3.1* 2.8*   CBG: Recent Labs  Lab 07/12/19 0624 07/12/19 1127 07/12/19 1642 07/12/19 2112 07/13/19 0643  GLUCAP 132* 151* 156* 137* 124*   Urine analysis:     Component Value Date/Time   COLORURINE STRAW (A) 07/08/2019 0008   APPEARANCEUR CLEAR 07/08/2019 0008   LABSPEC 1.006 07/08/2019 0008   PHURINE 6.0 07/08/2019 0008   GLUCOSEU NEGATIVE 07/08/2019 0008   HGBUR NEGATIVE 07/08/2019 0008   BILIRUBINUR NEGATIVE 07/08/2019 0008   KETONESUR NEGATIVE 07/08/2019 0008   PROTEINUR NEGATIVE 07/08/2019 0008   UROBILINOGEN 0.2 10/26/2014 2206   NITRITE NEGATIVE 07/08/2019 0008   LEUKOCYTESUR NEGATIVE 07/08/2019 0008    Recent Results (from the past 240 hour(s))  SARS Coronavirus 2 [facility] order, Performed in [facility] hospital lab) Nasopharyngeal Nasopharyngeal Swab     Status: None   Collection Time: 07/07/19  6:07 PM   Specimen: Nasopharyngeal Swab  Result Value Ref Range Status   SARS Coronavirus 2 NEGATIVE NEGATIVE Final    Comment: (NOTE) If result is NEGATIVE SARS-CoV-2 target nucleic acids are NOT DETECTED. The SARS-CoV-2 RNA is generally detectable in upper and lower  respiratory specimens during the acute phase of infection. The lowest  concentration of SARS-CoV-2 viral copies this assay can detect is 250  copies / mL. A negative result does not preclude SARS-CoV-2 infection  and should not be used as the sole basis for treatment or other  patient management decisions.  A negative result may occur with  improper specimen collection / handling, submission of specimen other  than nasopharyngeal swab, presence of viral mutation(s) within the  areas targeted by this assay, and inadequate number of viral copies  (<250 copies / mL). A negative result must be combined with clinical  observations, patient history, and epidemiological information. If result is POSITIVE SARS-CoV-2 target nucleic acids are DETECTED. The SARS-CoV-2 RNA is generally detectable in upper and lower  respiratory specimens dur ing the acute phase of infection.  Positive  results are indicative of active infection with SARS-CoV-2.  Clinical  correlation with  patient history and other diagnostic information is  necessary to determine patient infection status.  Positive results do  not rule out bacterial infection or co-infection with other viruses. If result is PRESUMPTIVE POSTIVE SARS-CoV-2 nucleic acids MAY BE PRESENT.   A presumptive positive result was obtained on the submitted specimen  and confirmed on repeat testing.  While 2019 novel coronavirus  (SARS-CoV-2) nucleic acids may be present in the submitted sample  additional confirmatory testing may be necessary for epidemiological  and / or clinical management purposes  to  differentiate between  SARS-CoV-2 and other Sarbecovirus currently known to infect humans.  If clinically indicated additional testing with an alternate test  methodology [id number]) is advised. The SARS-CoV-2 RNA is generally  detectable in upper and lower respiratory sp ecimens during the acute  phase of infection. The expected result is Negative. Fact Sheet for Patients:  StrictlyIdeas.no Fact Sheet for Healthcare Providers: BankingDealers.co.za This test is not yet approved or cleared by the [place] FDA and has been authorized for detection and/or diagnosis of SARS-CoV-2 by FDA under an Emergency Use Authorization (EUA).  This EUA will remain in effect (meaning this test can be used) for the duration of the COVID-19 declaration under Section 564(b)(1) of the Act, 21 U.S.C. section 360bbb-3(b)(1), unless the authorization is terminated or revoked sooner. Performed at [facility], [place] [address], [place], [place] [address]     Radiology Studies: No results found.  Scheduled Meds: . apixaban  5 mg Oral BID  . aspirin  81 mg Oral Daily  . budesonide (PULMICORT) nebulizer solution  0.25 mg Nebulization BID  . carvedilol  3.125 mg Oral BID WC  . chlorhexidine  15 mL Mouth Rinse BID  . ferrous sulfate  325 mg Oral BID  . furosemide  80 mg Intravenous  BID  . gabapentin  300 mg Oral BID  . guaiFENesin  600 mg Oral BID  . insulin aspart protamine- aspart  10 Units Subcutaneous BID WC  . mouth rinse  15 mL Mouth Rinse q12n4p  . mometasone-formoterol  2 puff Inhalation BID  . naLOXone (NARCAN)  injection  0.4 mg Intravenous Once  . oxybutynin  5 mg Oral QID  . pantoprazole  40 mg Oral BID  . pneumococcal 23 valent vaccine  0.5 mL Intramuscular Tomorrow-1000  . rosuvastatin  20 mg Oral q1800  . sodium chloride flush  3 mL Intravenous Q12H  . tamsulosin  0.4 mg Oral QPC supper   Continuous Infusions: . sodium chloride       LOS: 5 days   Little Ishikawa, DO [facility] Pager: [email or web address] Password [facility] 07/13/2019, 7:37 AM

## 2019-07-13 NOTE — TOC Initial Note (Addendum)
Transition of Care [facility] - Initial/Assessment Note    Patient Details  Name: Nicholas Caldwell MRN: [id number] Date of Birth: Aug 19, 1946  Transition of Care [facility]) CM/SW Contact:    Alberteen Sam, [place] Phone Number: [phone] 07/13/2019, 9:26 AM  Clinical Narrative:                  CSW met with patient at bedside on 9/8 to review discharge planning and PT rec of SNF for short term rehab. Patient reports he is refusing SNF at this time, reports he has his wife at home that assists him and believes he can walk with the rolling walker well at home. He reports he is currently set up with [facility]. Patient reports upon discharge he would like to resume services with [facility]. Patient identifies no further equipment needs at home.   CSW has reached out to [place] confirmed this information is accurate. Patient active with [facility] and RN, services to resume at time of discharge.   Expected Discharge Plan: [place] Barriers to Discharge: Continued Medical Work up   Patient Goals and CMS Choice Patient states their goals for this hospitalization and ongoing recovery are:: to go back home with his wife CMS Medicare.gov Compare Post Acute Care list provided to:: Patient Choice offered to / list presented to : Patient  Expected Discharge Plan and Services Expected Discharge Plan: [place] Choice: [place] arrangements for the past 2 months: Single Family Home                           HH Arranged: OT, PT [place] Agency: [place] ([facility]) Date HH Agency Contacted: 07/13/19 Time [place]: [phone] Representative spoke with at [place]: [place] Arrangements/Services Living arrangements for the past 2 months: [place] with:: Spouse Patient language and need for interpreter reviewed:: Yes Do you feel safe going back  to the place where you live?: Yes      Need for Family Participation in Patient Care: Yes (Comment) Care giver support system in place?: Yes (comment) Current home services: DME, Home PT, Home OT(rolling walker at home) Criminal Activity/Legal Involvement Pertinent to Current Situation/Hospitalization: No - Comment as needed  Activities of Daily Living Home Assistive Devices/Equipment: None ADL Screening (condition at time of admission) Patient's cognitive ability adequate to safely complete daily activities?: No Is the patient deaf or have difficulty hearing?: No Does the patient have difficulty seeing, even when wearing glasses/contacts?: No Does the patient have difficulty concentrating, remembering, or making decisions?: No Patient able to express need for assistance with ADLs?: Yes Does the patient have difficulty dressing or bathing?: No Independently performs ADLs?: Yes (appropriate for developmental age) Does the patient have difficulty walking or climbing stairs?: No Weakness of Legs: None Weakness of Arms/Hands: None  Permission Sought/Granted Permission sought to share information with : Case Manager, Customer service manager, Family Supports Permission granted to share information with : Yes, Verbal Permission Granted  Share Information with NAME: [name]  Permission granted to share info w AGENCY: [place] granted to share info w Relationship: spouse  Permission granted to share info w Contact Information: [phone]  Emotional Assessment Appearance:: Appears stated age Attitude/Demeanor/Rapport: Gracious Affect (typically observed): Calm Orientation: : Oriented to Self, Oriented to Place, Oriented to  Time, Oriented to Situation Alcohol / Substance Use: Not Applicable Psych Involvement: No (comment)  Admission diagnosis:  Peripheral edema [R60.9] Blood glucose elevated [R73.9] Elevated troponin [R79.89] AKI (acute kidney injury) ([place])  [N17.9] Acute on chronic congestive heart failure, unspecified heart failure type [facility]) [I50.9] Patient Active Problem List   Diagnosis Date Noted  . Acute CHF (congestive heart failure) ([place]) 07/07/2019  . Acute on chronic heart failure ([place]) 06/09/2019  . Lobar pneumonia ([place]) 04/14/2019  . Hyperkalemia 04/10/2019  . Cerebral embolism with cerebral infarction 03/21/2019  . Gastritis and gastroduodenitis   . NSVT (nonsustained ventricular tachycardia) ([employer]) 03/08/2019  . Tobacco abuse counseling 03/08/2019  . Iron deficiency anemia   . Acute on chronic systolic CHF (congestive heart failure) ([place]) 03/06/2019  . Diabetes mellitus type 2 in obese ([place])   . Primary osteoarthritis of right hip   . Hypomagnesemia   . Steroid-induced hyperglycemia   . Supplemental oxygen dependent   . Leukocytosis   . Hypokalemia   . Acute on chronic anemia   . Diabetic peripheral neuropathy ([place])   . Acute on chronic systolic (congestive) heart failure ([place])   . Acute on chronic respiratory failure ([place]) 01/14/2019  . Hypoxia   . Altered mental status   . Heart failure ([place]) 12/30/2018  . AKI (acute kidney injury) ([place])   . Acute respiratory failure ([place]) 11/22/2018  . Acute on chronic respiratory failure with hypoxia and hypercapnia ([place]) 11/13/2018  . Metabolic encephalopathy 41/01/130  . Acute respiratory failure with hypoxia and hypercapnia ([place]) 07/26/2018  . Acute exacerbation of CHF (congestive heart failure) ([place]) 07/26/2018  . CHF exacerbation ([place]) 05/01/2018  . CHF (congestive heart failure) ([place]) 04/30/2018  . Chronic respiratory failure with hypoxia ([place]) 12/28/2017  . Acute encephalopathy   . Atrial fibrillation with RVR ([place])   . SVT (supraventricular tachycardia) ([place])   . COPD GOLD 0   . Medically noncompliant   . Panlobular emphysema ([place])   . OSA (obstructive sleep apnea)   . Pulmonary hypertension ([place])   . Disorientation   . Pressure injury of skin 09/07/2016  . Acute  on chronic combined systolic and diastolic CHF (congestive heart failure) ([place]) 09/05/2016  . Skin lesion-left heal 09/05/2016  . Chronic systolic CHF (congestive heart failure) ([place]) 07/16/2016  . COPD (chronic obstructive pulmonary disease) ([place]) 07/16/2016  . Uncontrolled type 2 diabetes mellitus with complication ([place])   . Diabetic polyneuropathy associated with diabetes mellitus due to underlying condition ([place])   . Normocytic anemia 06/29/2016  . CAD - Non-obstructive by LHC1/16 01/21/2016  . Prolonged QT interval 10/09/2015  . Nonischemic cardiomyopathy ([place]) 10/09/2015  . PAF (paroxysmal atrial fibrillation) ([place])   . Chronic pain 02/19/2015  . Morbid obesity ([place]) 02/13/2015  . Dyspnea   . Elevated troponin I level 11/01/2014  . COPD exacerbation ([place]) 11/01/2014  . Essential hypertension 10/31/2014  . Type 2 diabetes mellitus with neuropathy 10/31/2014  . Hyperlipidemia  10/31/2014  . Cigarette smoker 10/31/2014   PCP:  Clinic, [place]:   [place] [address] [place] [address] Phone: [phone] Fax: [phone]     Social Determinants of Health (SDOH) Interventions    Readmission Risk Interventions Readmission Risk Prevention Plan 06/13/2019 04/14/2019 04/14/2019  Transportation Screening Complete Complete Complete  Medication Review (RN Care Manager) Complete Complete Complete  PCP or Specialist appointment within 3-5 days of discharge Complete Complete -  PCP/Specialist Appt Not Complete comments - 6/17 with cardiology -  [place] or Home Care Consult Complete Complete -  SW Recovery Care/Counseling Consult Complete Complete -  Palliative Care Screening Not Applicable Not Applicable -  [place] Not Applicable Not Applicable -  Some recent data might be hidden

## 2019-07-14 DIAGNOSIS — Z515 Encounter for palliative care: Secondary | ICD-10-CM

## 2019-07-14 DIAGNOSIS — R5383 Other fatigue: Secondary | ICD-10-CM

## 2019-07-14 DIAGNOSIS — Z7189 Other specified counseling: Secondary | ICD-10-CM

## 2019-07-14 LAB — GLUCOSE, CAPILLARY
Glucose-Capillary: 136 mg/dL — ABNORMAL HIGH (ref 70–99)
Glucose-Capillary: 165 mg/dL — ABNORMAL HIGH (ref 70–99)
Glucose-Capillary: 126 mg/dL — ABNORMAL HIGH (ref 70–99)
Glucose-Capillary: 143 mg/dL — ABNORMAL HIGH (ref 70–99)

## 2019-07-14 LAB — CBC
HCT: 29.1 % — ABNORMAL LOW (ref 39.0–52.0)
Hemoglobin: 8.4 g/dL — ABNORMAL LOW (ref 13.0–17.0)
MCH: 24.9 pg — ABNORMAL LOW (ref 26.0–34.0)
MCHC: 28.9 g/dL — ABNORMAL LOW (ref 30.0–36.0)
MCV: 86.4 fL (ref 80.0–100.0)
Platelets: 324 10*3/uL (ref 150–400)
RBC: 3.37 MIL/uL — ABNORMAL LOW (ref 4.22–5.81)
RDW: 17.7 % — ABNORMAL HIGH (ref 11.5–15.5)
WBC: 5.7 10*3/uL (ref 4.0–10.5)
nRBC: 0 % (ref 0.0–0.2)

## 2019-07-14 LAB — BASIC METABOLIC PANEL
Anion gap: 12 (ref 5–15)
BUN: 60 mg/dL — ABNORMAL HIGH (ref 8–23)
CO2: 39 mmol/L — ABNORMAL HIGH (ref 22–32)
Calcium: 8.5 mg/dL — ABNORMAL LOW (ref 8.9–10.3)
Chloride: 91 mmol/L — ABNORMAL LOW (ref 98–111)
Creatinine, Ser: 1.81 mg/dL — ABNORMAL HIGH (ref 0.61–1.24)
GFR calc Af Amer: 43 mL/min — ABNORMAL LOW (ref >60)
GFR calc non Af Amer: 37 mL/min — ABNORMAL LOW (ref >60)
Glucose, Bld: 140 mg/dL — ABNORMAL HIGH (ref 70–99)
Potassium: 3.9 mmol/L (ref 3.5–5.1)
Sodium: 142 mmol/L (ref 135–145)

## 2019-07-14 MED ORDER — POLYETHYLENE GLYCOL 3350 17 G PO PACK
17.0000 g | PACK | Freq: Two times a day (BID) | ORAL | Status: DC
  Administered 2019-07-15 – 2019-07-21 (×7): 17 g via ORAL
  Filled 2019-07-14 (×14): qty 1

## 2019-07-14 MED ORDER — FLEET ENEMA 7-19 GM/118ML RE ENEM
1.0000 | ENEMA | Freq: Once | RECTAL | Status: AC
  Administered 2019-07-14: 13:00:00 1 via RECTAL
  Filled 2019-07-14: qty 1

## 2019-07-14 NOTE — Plan of Care (Signed)

## 2019-07-14 NOTE — Progress Notes (Signed)
Physical Therapy Treatment Patient Details Name: Nicholas Caldwell MRN: [id number] DOB: 1946-07-18 Today's Date: 07/14/2019    History of Present Illness 73 yo admitted with SOB with CHF exacerbation. PMHx: Afib, CAD, CHF, COPD on home O2, DM, HTN, HLD    PT Comments    Pt admitted with above diagnosis. Pt was able to ambulate with RW even though he was reluctant.  Pt needed +2 min assist with chair follow.  Pt with poor safety awareness however he did progress ambulation.  Will continue to follow pt for progression as able.   Pt currently with functional limitations due to balance and endurance deficits. Pt will benefit from skilled PT to increase their independence and safety with mobility to allow discharge to the venue listed below.     Follow Up Recommendations  SNF;Supervision/Assistance - 24 hour;Home health PT(pending progression)     Equipment Recommendations  None recommended by PT    Recommendations for Other Services       Precautions / Restrictions Precautions Precautions: Fall Precaution Comments: watch sats Restrictions Weight Bearing Restrictions: No    Mobility  Bed Mobility               General bed mobility comments: in chair on arrival  Transfers Overall transfer level: Needs assistance Equipment used: Rolling walker (2 wheeled) Transfers: Sit to/from Stand Sit to Stand: Min assist;+2 safety/equipment;Min guard         General transfer comment: min assist to steady with multimodal cues.   Ambulation/Gait Ambulation/Gait assistance: Min guard;Min assist;+2 safety/equipment Gait Distance (Feet): 100 Feet Assistive device: Rolling walker (2 wheeled) Gait Pattern/deviations: Step-to pattern;Decreased step length - right;Decreased step length - left;Decreased stride length;Wide base of support;Trunk flexed   Gait velocity interpretation: <1.31 ft/sec, indicative of household ambulator General Gait Details: Pt flexes forward and widens BOS.  Pt took  two standing rest breaks leaning on elbows on RW with chair follow and sat after 100 feet due to fatigue.  Needs min assist at times for balance.  +2 for safety due to chair follow needed.    Stairs             Wheelchair Mobility    Modified Rankin (Stroke Patients Only)       Balance Overall balance assessment: Needs assistance Sitting-balance support: No upper extremity supported;Feet supported Sitting balance-Leahy Scale: Fair     Standing balance support: Bilateral upper extremity supported Standing balance-Leahy Scale: Poor Standing balance comment: Relies on UE support for standing, and external support.                             Cognition Arousal/Alertness: Awake/alert Behavior During Therapy: Flat affect Overall Cognitive Status: Impaired/Different from baseline Area of Impairment: Orientation;Attention;Memory;Following commands;Safety/judgement;Problem solving                 Orientation Level: Disoriented to;Time;Situation Current Attention Level: Focused Memory: Decreased short-term memory Following Commands: Follows one step commands inconsistently;Follows one step commands with increased time Safety/Judgement: Decreased awareness of safety;Decreased awareness of deficits   Problem Solving: Slow processing;Requires verbal cues;Requires tactile cues General Comments: Pt states, " I will walk with you if you promise to never come back."      Exercises General Exercises - Lower Extremity Long Arc Quad: AROM;Both;Seated;10 reps Hip Flexion/Marching: AROM;Both;Seated;10 reps    General Comments General comments (skin integrity, edema, etc.): Pt on 6LO2 and sats >87%      Pertinent Vitals/Pain Pain  Assessment: No/denies pain    Home Living                      Prior Function            PT Goals (current goals can now be found in the care plan section) Acute Rehab PT Goals Patient Stated Goal: return home and watch  tv Progress towards PT goals: Progressing toward goals    Frequency    Min 3X/week      PT Plan Current plan remains appropriate    Co-evaluation              AM-PAC PT "6 Clicks" Mobility   Outcome Measure  Help needed turning from your back to your side while in a flat bed without using bedrails?: A Lot Help needed moving from lying on your back to sitting on the side of a flat bed without using bedrails?: A Lot Help needed moving to and from a bed to a chair (including a wheelchair)?: A Little Help needed standing up from a chair using your arms (e.g., wheelchair or bedside chair)?: A Little Help needed to walk in hospital room?: A Little Help needed climbing 3-5 steps with a railing? : A Lot 6 Click Score: 15    End of Session Equipment Utilized During Treatment: Oxygen;Gait belt Activity Tolerance: Patient limited by fatigue(self limiting) Patient left: in chair;with call bell/phone within reach;with chair alarm set Nurse Communication: Mobility status PT Visit Diagnosis: Muscle weakness (generalized) (M62.81);Other abnormalities of gait and mobility (R26.89);History of falling (Z91.81)     Time: 8832-5498 PT Time Calculation (min) (ACUTE ONLY): 17 min  Charges:  $Gait Training: 8-22 mins                     [place] Pager:  [phone]  Office:  [place] 07/14/2019, 10:58 AM

## 2019-07-14 NOTE — Consult Note (Signed)
[place] Nurse wound consult note Reason for Consult: Right great toe with trauma, nail has lifted slightly with purulence and musty odor noted.  Wound type:trauma Pressure Injury POA: NA Measurement:Right great toe:  Purulence from beneath nail bed after trauma.  Patient is wheelchair bound.  Wound OBS:JGGE Drainage (amount, consistency, odor) minimal purulence and serosanguinous oozing from beneath nail bed.  Periwound: warm and intact Dressing procedure/placement/frequency: Cleanse right great toe with NS and pat dry.  Paint right great toe with betadine daily.  Open to air. If drainage requires, may cover with dry dressing.  Will not follow at this time.  Please re-consult if needed.  Domenic Moras MSN, RN, FNP-BC CWON Wound, Ostomy, Continence Nurse Pager [phone]

## 2019-07-14 NOTE — Progress Notes (Signed)
Patient ID: Cleven Jansma, male   DOB: 1946-08-02, 73 y.o.   MRN: [id number]  This NP visited patient at the bedside as a follow up to  yesterday's [place], and to meet with wife at bedside as scheduled, to discuss diagnosis, prognosis, GOCs, anticipatory care needs and  options.  Again I explained in detail the role of palliative medicine  in a holistic approach to care.   We discussed disease trajectory of COPD and CHF and CKD.  We discussed limitations of medical interventions to prolong quality of life when a body begins to fail and human mortality  A detailed discussion was had today regarding advanced directives.  Concepts specific to code status, artifical feeding and hydration, continued IV antibiotics and rehospitalization was had.  The difference between a aggressive medical intervention path  and a palliative comfort care path for this patient at this time was had.  Values and goals of care important to patient and family were attempted to be elicited.  MOST form introduced  Discussed with patient the importance of continued conversation with family and their  medical providers regarding overall plan of care and treatment options,  ensuring decisions are within the context of the patients values and GOCs.  Discussed importance of conversation and documentation of advanced care planning.   Questions and concerns addressed.   Family encouraged to call with questions or concerns.  Recommend OP palliative care services on dc, patient does not want to consider SNF for rehab.  Total time spent on the unit was 60 minutes  Greater than 50% of the time was spent in counseling and coordination of care  Wadie Lessen NP  Palliative Medicine Team Team Phone # [phone] Pager [phone]

## 2019-07-14 NOTE — Progress Notes (Signed)
PROGRESS NOTE    Nicholas Caldwell   BBC:[id number]  DOB: 1945/12/27  DOA: 07/07/2019 PCP: [facility]   Brief Narrative:  Early Steel   is a 73 y.o. male who is wheelchair bound  w hypertension, hyperlipidemia, Paroxysmal afib, CAD, CHF (EF 40-45%) , Dm2, w neuropathy, Copd on home o2 3L [place],  OSA, w recent admission 06/08/2019 for acute on chronic respiratory failure secondary to CHF presents with dyspnea for the past 3 week, as well as weight gain of about 15lbs per pt. His breathing has been worse for the past 3 days. Pulse ox was 74% on room air. CXR > Cardiomegaly with interstitial edema and layering effusions. Pro BNP. 2,211.9 Troponin 37. He was last admitted from 8/5-8/10 for CHF exacerbation.  He developed acute respiratory failure on 9/5 and required BiPAP x24 hours.  I had an extensive discussion with him about wearing his CPAP at night every night.  He declined his CPAP again last night and took 10 mg of oxycodone this morning after which he feels dizzy.   Subjective: No acute issues or events overnight, continues to complain of lower extremity edema and shortness of breath even with minimal exertion.  Declines chest pain, nausea, vomiting, diarrhea, headache, fevers, chills.  Assessment & Plan: Principal Problem:   Acute on chronic combined systolic and diastolic CHF (congestive heart failure) (HCC) Active Problems:   Essential hypertension   Type 2 diabetes mellitus with neuropathy   Elevated troponin I level   PAF (paroxysmal atrial fibrillation) (HCC)   Acute CHF (congestive heart failure) ([place])   Palliative care by specialist   DNR (do not resuscitate) discussion   Fatigue   Acute on chronic diastolic CHF and RV failure patient with severe COPD, obesity hypoventilation and obstructive sleep apnea -Patient's baseline oxygen is 3 L nasal cannula around-the-clock, continues to require 5 L nasal cannula and BiPAP here for support -Last weight was 116 kg on 8/10- now  is 125 kg - cont to diurese with 80 mg IV Lasix twice daily until euvolemic and symptoms have resolved- EF 40-45 %, diffuse LV hypokinesis, RV was severely enlarged -Continues to diurese well I's and O's net negative nearly 3 L since admission - restrict fluids given increased PO intake yesterday - current ECHO shows EF has improved to 60% and RV function mildly reduced - current exacerbation is likely due to diastolic failure -Lengthy discussion at bedside about patient noncompliance, patient indicates he is doing "everything he supposed to" at home but when pressed patient does not really maintain I's and O's, salt restriction or fluid restriction.  We discussed that should patient continue to be markedly noncompliant with his regimen he would likely only worsen over the next few months with markedly increased morbidity mortality.  Acute hypoxia on chronic hypoxic respiratory failure, ongoing Concurrent hypercapnic respiratory failure likely in the setting chronic COPD and acute HF exacerbation. -Baseline oxygen requirements 3 L nasal cannula around-the-clock, maintains 5 L nasal cannula and BiPAP to maintain O2 above 90% even at rest  -Somewhat lethargic on 07/09/2019, CO2 noted to be 108 -minimal improvement with Narcan - improved with BiPap/Cpap  COPD with chronic hypoxia, not in acute exacerbation - Severe, on 3L [place] oxygen at home 24h/day.   Morbid obesity  OHS/OSA Body mass index is 36.93 kg/m. -Continue CPAP -he admits that he does not wear his CPAP at least 3 nights out of the week which may be causing his CHF exacerbations- we have had a discussion about  this but he continues to be resistant to wearing it  Chronic pedal edema - He wears [employer]na boots that were changed on 9/6  Hypotension  - BP was in low 100s- follow closely while diuresing - Holding BiDil in the mean time while diuresing  PAF, Chadsvasc 7 - Was seen in past by Dr. Rayann Heman, not thought to be good ablation candidate.  He has not been on Tikosyn or amiodarone due to history of long QT.  - Continue Eliquis.  - in sinus rhythm with short episodes of v tach  V tach on 9/4 - likely due to CHF exacerbation   QTc checked and was no longer high>  480- started low dose Coreg- V tach has resolved- last episode was on 9/5 -Repeated EKG on 9/7-QTC was 401  Chronic pain -Continue home oxycodone at 5 mg (reduced dose given hypercarbia previously)  Chronic kidney disease stage III-IV - follow with diuresis - creatinine downtrending appropriately   Constipation  -Fleets enema x1, increase MiraLAX now scheduled -Follow for BM  Type 2 diabetes mellitus with neuropathy - cont 70/30 - sugars controlled  DVT prophylaxis: Eliquis Code Status: Full code Family Communication:  Disposition Plan: home+HH vs SNF-he is wheelchair-bound due to weakness/obesity -tentative disposition home pending clinical resolution and provement in hypoxia as above. Pending clinical improvement.  Consultants:   None  Procedures:  2 D ECHO 1. The left ventricle has hyperdynamic systolic function, with an ejection fraction of >65%. The cavity size was normal. There is mildly increased left ventricular wall thickness. Left ventricular diastolic Doppler parameters are indeterminate. No  evidence of left ventricular regional wall motion abnormalities.  2. The right ventricle has mildly reduced systolic function. The cavity was moderately enlarged. There is not assessed.  3. Left atrial size was mildly dilated.  4. Right atrial size was moderately dilated.  5. The aortic valve is tricuspid. No stenosis of the aortic valve.  6. The aorta is not well visualized unless otherwise noted.  7. The inferior vena cava was dilated in size with <50% respiratory variability.  8. The interatrial septum was not well visualized.  Antimicrobials:  Anti-infectives (From admission, onward)   None     Objective: Vitals:   07/14/19 0348 07/14/19  0400 07/14/19 0606 07/14/19 0608  BP:  111/79 108/62 108/62  Pulse: 68 72 76 74  Resp: (!) 22 (!) 22 17   Temp:      TempSrc:      SpO2: 90% 94% (!) 87%   Weight:      Height:        Intake/Output Summary (Last 24 hours) at 07/14/2019 0721 Last data filed at 07/14/2019 0615 Gross per 24 hour  Intake 2163 ml  Output 1650 ml  Net 513 ml   Filed Weights   07/12/19 0826 07/13/19 0552 07/14/19 0345  Weight: 126.4 kg 125.5 kg 126.2 kg    Physical examination: General:  Pleasantly resting in bed, No acute distress. HEENT:  Normocephalic atraumatic.  Sclerae nonicteric, noninjected.  Extraocular movements intact bilaterally. Neck:  Without mass or deformity.  Trachea is midline. Lungs: Bibasilar rales without overt rhonchi or wheeze. Heart:  Regular rate and rhythm.  Without murmurs, rubs, or gallops. Abdomen:  Soft, nontender, nondistended.  Without guarding or rebound. Extremities: 2+ pitting edema to the knees bilaterally, without cyanosis, clubbing, or obvious deformity. Vascular:  Dorsalis pedis and posterior tibial pulses palpable bilaterally. Skin:  Warm and dry, no erythema, no ulcerations.   Data Reviewed:  I have personally reviewed following labs and imaging studies  CBC: Recent Labs  Lab 07/07/19 1803 07/08/19 0530 07/10/19 0756 07/14/19 0237  WBC 5.6 5.7 5.3 5.7  NEUTROABS 4.4  --   --   --   HGB 9.6* 9.1* 9.1* 8.4*  HCT 32.5* 30.4* 32.2* 29.1*  MCV 85.1 84.9 88.7 86.4  PLT 355 346 349 875   Basic Metabolic Panel: Recent Labs  Lab 07/10/19 0756 07/11/19 0215 07/12/19 0301 07/13/19 0241 07/14/19 0237  NA 139 138 140 141 142  K 5.6* 4.7 4.4 4.0 3.9  CL 90* 90* 90* 91* 91*  CO2 37* 38* 39* 40* 39*  GLUCOSE 100* 138* 124* 120* 140*  BUN 74* 77* 70* 63* 60*  CREATININE 2.14* 2.28* 1.98* 1.84* 1.81*  CALCIUM 8.9 8.6* 8.8* 8.6* 8.5*  MG  --   --  2.4  --   --    GFR: Estimated Creatinine Clearance: 51.4 mL/min (A) (by C-G formula based on SCr of 1.81  mg/dL (H)). Liver Function Tests: Recent Labs  Lab 07/07/19 1803 07/08/19 0530  AST 23 16  ALT 12 10  ALKPHOS 91 86  BILITOT 0.3 0.5  PROT 6.9 6.4*  ALBUMIN 3.1* 2.8*   CBG: Recent Labs  Lab 07/13/19 0643 07/13/19 1126 07/13/19 1625 07/13/19 2127 07/14/19 0615  GLUCAP 124* 109* 155* 153* 136*   Urine analysis:    Component Value Date/Time   COLORURINE STRAW (A) 07/08/2019 0008   APPEARANCEUR CLEAR 07/08/2019 0008   LABSPEC 1.006 07/08/2019 0008   PHURINE 6.0 07/08/2019 0008   GLUCOSEU NEGATIVE 07/08/2019 0008   HGBUR NEGATIVE 07/08/2019 0008   BILIRUBINUR NEGATIVE 07/08/2019 0008   KETONESUR NEGATIVE 07/08/2019 0008   PROTEINUR NEGATIVE 07/08/2019 0008   UROBILINOGEN 0.2 10/26/2014 2206   NITRITE NEGATIVE 07/08/2019 0008   LEUKOCYTESUR NEGATIVE 07/08/2019 0008    Recent Results (from the past 240 hour(s))  SARS Coronavirus 2 [facility] order, Performed in [facility] hospital lab) Nasopharyngeal Nasopharyngeal Swab     Status: None   Collection Time: 07/07/19  6:07 PM   Specimen: Nasopharyngeal Swab  Result Value Ref Range Status   SARS Coronavirus 2 NEGATIVE NEGATIVE Final    Comment: (NOTE) If result is NEGATIVE SARS-CoV-2 target nucleic acids are NOT DETECTED. The SARS-CoV-2 RNA is generally detectable in upper and lower  respiratory specimens during the acute phase of infection. The lowest  concentration of SARS-CoV-2 viral copies this assay can detect is 250  copies / mL. A negative result does not preclude SARS-CoV-2 infection  and should not be used as the sole basis for treatment or other  patient management decisions.  A negative result may occur with  improper specimen collection / handling, submission of specimen other  than nasopharyngeal swab, presence of viral mutation(s) within the  areas targeted by this assay, and inadequate number of viral copies  (<250 copies / mL). A negative result must be combined with clinical  observations, patient  history, and epidemiological information. If result is POSITIVE SARS-CoV-2 target nucleic acids are DETECTED. The SARS-CoV-2 RNA is generally detectable in upper and lower  respiratory specimens dur ing the acute phase of infection.  Positive  results are indicative of active infection with SARS-CoV-2.  Clinical  correlation with patient history and other diagnostic information is  necessary to determine patient infection status.  Positive results do  not rule out bacterial infection or co-infection with other viruses. If result is PRESUMPTIVE POSTIVE SARS-CoV-2 nucleic acids MAY BE PRESENT.  A presumptive positive result was obtained on the submitted specimen  and confirmed on repeat testing.  While 2019 novel coronavirus  (SARS-CoV-2) nucleic acids may be present in the submitted sample  additional confirmatory testing may be necessary for epidemiological  and / or clinical management purposes  to differentiate between  SARS-CoV-2 and other Sarbecovirus currently known to infect humans.  If clinically indicated additional testing with an alternate test  methodology [phone]) is advised. The SARS-CoV-2 RNA is generally  detectable in upper and lower respiratory sp ecimens during the acute  phase of infection. The expected result is Negative. Fact Sheet for Patients:  StrictlyIdeas.no Fact Sheet for Healthcare Providers: BankingDealers.co.za This test is not yet approved or cleared by the [place] FDA and has been authorized for detection and/or diagnosis of SARS-CoV-2 by FDA under an Emergency Use Authorization (EUA).  This EUA will remain in effect (meaning this test can be used) for the duration of the COVID-19 declaration under Section 564(b)(1) of the Act, 21 U.S.C. section 360bbb-3(b)(1), unless the authorization is terminated or revoked sooner. Performed at [facility], [place] [address], [place], [place] [address]      Radiology Studies: No results found.  Scheduled Meds: . apixaban  5 mg Oral BID  . aspirin  81 mg Oral Daily  . budesonide (PULMICORT) nebulizer solution  0.25 mg Nebulization BID  . carvedilol  3.125 mg Oral BID WC  . chlorhexidine  15 mL Mouth Rinse BID  . ferrous sulfate  325 mg Oral BID  . furosemide  80 mg Intravenous BID  . gabapentin  300 mg Oral BID  . guaiFENesin  600 mg Oral BID  . insulin aspart protamine- aspart  10 Units Subcutaneous BID WC  . mouth rinse  15 mL Mouth Rinse q12n4p  . mometasone-formoterol  2 puff Inhalation BID  . naLOXone (NARCAN)  injection  0.4 mg Intravenous Once  . oxybutynin  5 mg Oral QID  . pantoprazole  40 mg Oral BID  . pneumococcal 23 valent vaccine  0.5 mL Intramuscular Tomorrow-1000  . rosuvastatin  20 mg Oral q1800  . sodium chloride flush  3 mL Intravenous Q12H  . tamsulosin  0.4 mg Oral QPC supper   Continuous Infusions: . sodium chloride       LOS: 6 days   Little Ishikawa, DO [facility] Pager: [email or web address] Password [email or web address] 07/14/2019, 7:21 AM

## 2019-07-14 NOTE — Care Management Important Message (Signed)
Important Message  Patient Details  Name: Nicholas Caldwell MRN: [id number] Date of Birth: 09-Oct-1946   Medicare Important Message Given:  Yes     Memory Argue 07/14/2019, 3:26 PM

## 2019-07-14 NOTE — Progress Notes (Signed)
RN placed pt on BIPAP V60. RT came in to assess pt and pt is tolerating well. Pt respiratory status is stable at this time. RT will continue to monitor.

## 2019-07-15 LAB — CBC
HCT: 29.5 % — ABNORMAL LOW (ref 39.0–52.0)
Hemoglobin: 8.7 g/dL — ABNORMAL LOW (ref 13.0–17.0)
MCH: 25.4 pg — ABNORMAL LOW (ref 26.0–34.0)
MCHC: 29.5 g/dL — ABNORMAL LOW (ref 30.0–36.0)
MCV: 86 fL (ref 80.0–100.0)
Platelets: 324 10*3/uL (ref 150–400)
RBC: 3.43 MIL/uL — ABNORMAL LOW (ref 4.22–5.81)
RDW: 17.8 % — ABNORMAL HIGH (ref 11.5–15.5)
WBC: 5.7 10*3/uL (ref 4.0–10.5)
nRBC: 0 % (ref 0.0–0.2)

## 2019-07-15 LAB — GLUCOSE, CAPILLARY
Glucose-Capillary: 140 mg/dL — ABNORMAL HIGH (ref 70–99)
Glucose-Capillary: 116 mg/dL — ABNORMAL HIGH (ref 70–99)
Glucose-Capillary: 169 mg/dL — ABNORMAL HIGH (ref 70–99)
Glucose-Capillary: 145 mg/dL — ABNORMAL HIGH (ref 70–99)

## 2019-07-15 LAB — BASIC METABOLIC PANEL
Anion gap: 13 (ref 5–15)
BUN: 54 mg/dL — ABNORMAL HIGH (ref 8–23)
CO2: 37 mmol/L — ABNORMAL HIGH (ref 22–32)
Calcium: 8.7 mg/dL — ABNORMAL LOW (ref 8.9–10.3)
Chloride: 94 mmol/L — ABNORMAL LOW (ref 98–111)
Creatinine, Ser: 1.67 mg/dL — ABNORMAL HIGH (ref 0.61–1.24)
GFR calc Af Amer: 47 mL/min — ABNORMAL LOW (ref >60)
GFR calc non Af Amer: 41 mL/min — ABNORMAL LOW (ref >60)
Glucose, Bld: 118 mg/dL — ABNORMAL HIGH (ref 70–99)
Potassium: 3.8 mmol/L (ref 3.5–5.1)
Sodium: 144 mmol/L (ref 135–145)

## 2019-07-15 MED ORDER — OXYCODONE-ACETAMINOPHEN 10-325 MG PO TABS: 1.0000 | ORAL_TABLET | Freq: Four times a day (QID) | ORAL | Status: DC | PRN

## 2019-07-15 MED ORDER — OXYCODONE HCL 5 MG PO TABS
5.0000 mg | ORAL_TABLET | Freq: Four times a day (QID) | ORAL | Status: DC | PRN
  Administered 2019-07-15 – 2019-07-22 (×25): 5 mg via ORAL
  Filled 2019-07-15 (×25): qty 1

## 2019-07-15 MED ORDER — OXYCODONE-ACETAMINOPHEN 5-325 MG PO TABS
1.0000 | ORAL_TABLET | Freq: Four times a day (QID) | ORAL | Status: DC | PRN
  Administered 2019-07-15 – 2019-07-22 (×25): 1 via ORAL
  Filled 2019-07-15 (×25): qty 1

## 2019-07-15 NOTE — Plan of Care (Signed)
Nutrition Education Note  RD consulted for nutrition education regarding CHF.  Spoke with pt and his wife at bedside. Pt reports that he has made many changes in his diet for his heart health including not eating a lot of fatty foods and decreasing salt intake.  Pt's wife shares that she does most of the cooking and that she does not use salt when cooking. Pt denies using salt at the table to season his food.  Pt typically eats 3 meals daily: Breakfast: cream of wheat made from scratch Lunch: sandwich with low sodium deli [place] meat Dinner: chicken or fish with mixed greens and lima beans  Pt states that he typically drinks water and diet/zero sodas. Pt and his wife do not eat out often but when they do they may have pizza.  RD provided "Low Sodium Nutrition Therapy" handout from the Academy of [employer]. Reviewed patient's dietary recall. Provided examples on ways to decrease sodium intake in diet. Discouraged intake of processed foods and use of salt shaker. Encouraged fresh fruits and vegetables as well as whole grain sources of carbohydrates to maximize fiber intake.   RD discussed why it is important for patient to adhere to diet recommendations, and emphasized the role of fluids, foods to avoid, and importance of weighing self daily. Teach back method used.  Expect good compliance.  Body mass index is 36.63 kg/m. Pt meets criteria for obesity class III based on current BMI.  Current diet order is Heart Healthy, patient is consuming approximately 100% of meals at this time. Labs and medications reviewed. No further nutrition interventions warranted at this time. RD contact information provided. If additional nutrition issues arise, please re-consult RD.    Gaynell Face, MS, RD, LDN Inpatient Clinical Dietitian Pager: [phone] Weekend/After Hours: [phone]

## 2019-07-15 NOTE — Progress Notes (Addendum)
PROGRESS NOTE    Nicholas Caldwell   EZM:[id number]  DOB: 1946-04-26  DOA: 07/07/2019 PCP: [facility]   Brief Narrative:  Nicholas Caldwell   is a 73 y.o. male who is wheelchair bound  w hypertension, hyperlipidemia, Paroxysmal afib, CAD, CHF (EF 40-45%) , Dm2, w neuropathy, Copd on home o2 3L [place],  OSA, w recent admission 06/08/2019 for acute on chronic respiratory failure secondary to CHF presents with dyspnea for the past 3 week, as well as weight gain of about 15lbs per pt. His breathing has been worse for the past 3 days. Pulse ox was 74% on room air. CXR > Cardiomegaly with interstitial edema and layering effusions. Pro BNP. 2,211.9 Troponin 37. He was last admitted from 8/5-8/10 for CHF exacerbation.  He developed acute respiratory failure on 9/5 and required BiPAP x24 hours.  I had an extensive discussion with him about wearing his CPAP at night every night.  He declined his CPAP again last night and took 10 mg of oxycodone this morning after which he feels dizzy.   Subjective: No acute issues or events overnight, complaining of right hip and lumbar pain today requesting to be placed back on his home dose of oxycodone.  Declines chest pain, nausea, vomiting, diarrhea, headache, fevers, chills.  Assessment & Plan: Principal Problem:   Acute on chronic combined systolic and diastolic CHF (congestive heart failure) (HCC) Active Problems:   Essential hypertension   Type 2 diabetes mellitus with neuropathy   Elevated troponin I level   PAF (paroxysmal atrial fibrillation) (HCC)   Acute CHF (congestive heart failure) ([place])   Palliative care by specialist   DNR (do not resuscitate) discussion   Fatigue    Acute on chronic diastolic CHF and RV failure patient with severe COPD, obesity hypoventilation and obstructive sleep apnea, minimally improving - Patient's baseline oxygen is 3 L nasal cannula around-the-clock, continues to require 4 L nasal cannula and CPAP -continue to wean oxygen  as tolerated - Last weight was 116 kg on 8/10- now 122 kg - Cont to diurese with 80 mg IV Lasix twice daily until euvolemic and symptoms have resolved- EF 40-45 %, diffuse LV hypokinesis, RV was severely enlarged  Intake/Output Summary (Last 24 hours) at 07/15/2019 0718 Last data filed at 07/15/2019 5035 Gross per 24 hour  Intake 1116 ml  Output 2575 ml  Net -1459 ml  - current ECHO shows EF has improved to 60% and RV function mildly reduced -Lengthy discussion at bedside daily about concern over noncompliance, although after lengthy discussion it appears patient is not fully understanding fluid/salt restriction (poor diet - lots of pre-prepared foods). Will have dietary re-evaluate.  Acute hypoxia on chronic hypoxic respiratory failure, ongoing Concurrent hypercapnic respiratory failure likely in the setting of obesity hypoventilation, chronic COPD, and acute HF exacerbation. -Baseline oxygen requirements 3 L nasal cannula around-the-clock, maintains 4L nasal cannula and CPAP to maintain O2 above 90% even at rest  -Somewhat lethargic on 07/09/2019, CO2 noted to be 108 -minimal improvement with Narcan - improved with BiPap/Cpap  COPD with chronic hypoxia, not in acute exacerbation - Severe, on 3L  oxygen at home 24h/day.   Morbid obesity  OHS/OSA Body mass index is 36.93 kg/m. -Continue CPAP -he admits that he does not wear his CPAP at least 3 nights out of the week which may be causing his CHF exacerbations- we have had a discussion about this but he continues to be resistant to wearing it  Chronic pedal edema - He  wears Unna boots/wraps - keep elevated as possilbe  Borderline hypotension - without symptoms - BP was in low 100s- follow closely while diuresing - Holding BiDil in the mean time while diuresing  PAF, Chadsvasc 7 - Was seen in past by Dr. Rayann Heman, not thought to be good ablation candidate. He has not been on Tikosyn or amiodarone due to history of long QT.  - Continue  Eliquis.  - in sinus rhythm with short episodes of v tach  V tach on 9/4 - likely due to CHF exacerbation   QTc checked and was no longer high>  480- started low dose Coreg- V tach has resolved- last episode was on 9/5 -Repeated EKG on 9/7-QTC was 401  Chronic pain -Resume home Percocet at 10 mg, previously reduced to 5 mg due to hypercarbia, if patient has recurrent episode of hypoxia or hypercarbia will likely need to discontinue narcotics completely.  Chronic kidney disease stage III-IV - Creatinine downtrending appropriately with diuresis   Constipation  -Fleets enema x1, increase MiraLAX now scheduled -Follow for BM  Type 2 diabetes mellitus with neuropathy - cont 70/30 - sugars controlled  DVT prophylaxis: Eliquis Code Status: Full code Family Communication:  Disposition Plan: home+HH vs SNF-he reports being wheelchair-bound due to weakness/obesity but is improving with PT-tentative disposition home versus SNF pending clinical improvement over the next few days.  Consultants:   None  Procedures:  2 D ECHO 1. The left ventricle has hyperdynamic systolic function, with an ejection fraction of >65%. The cavity size was normal. There is mildly increased left ventricular wall thickness. Left ventricular diastolic Doppler parameters are indeterminate. No  evidence of left ventricular regional wall motion abnormalities.  2. The right ventricle has mildly reduced systolic function. The cavity was moderately enlarged. There is not assessed.  3. Left atrial size was mildly dilated.  4. Right atrial size was moderately dilated.  5. The aortic valve is tricuspid. No stenosis of the aortic valve.  6. The aorta is not well visualized unless otherwise noted.  7. The inferior vena cava was dilated in size with <50% respiratory variability.  8. The interatrial septum was not well visualized.  Antimicrobials:  Anti-infectives (From admission, onward)   None     Objective: Vitals:    07/14/19 2336 07/14/19 2358 07/15/19 0500 07/15/19 0604  BP:  (!) 103/58  106/72  Pulse: 74   79  Resp: 20     Temp:  98.9 F (37.2 C)    TempSrc:  Oral    SpO2:  100%    Weight:   122.5 kg   Height:        Intake/Output Summary (Last 24 hours) at 07/15/2019 0714 Last data filed at 07/15/2019 0614 Gross per 24 hour  Intake 1116 ml  Output 2575 ml  Net -1459 ml   Filed Weights   07/13/19 0552 07/14/19 0345 07/15/19 0500  Weight: 125.5 kg 126.2 kg 122.5 kg    Physical examination: General:  Pleasantly resting in bed, No acute distress. HEENT:  Normocephalic atraumatic.  Sclerae nonicteric, noninjected.  Extraocular movements intact bilaterally. Neck:  Without mass or deformity.  Trachea is midline. Lungs: Bibasilar rales without overt rhonchi or wheeze. Heart:  Regular rate and rhythm.  Without murmurs, rubs, or gallops. Abdomen:  Soft, nontender, nondistended.  Without guarding or rebound. Extremities: 1-2 pitting edema to the knees bilaterally, without cyanosis, clubbing, or obvious deformity. Vascular:  Dorsalis pedis and posterior tibial pulses palpable bilaterally. Skin:  Warm and dry,  no erythema, no ulcerations.   Data Reviewed:  I have personally reviewed following labs and imaging studies  CBC: Recent Labs  Lab 07/10/19 0756 07/14/19 0237  WBC 5.3 5.7  HGB 9.1* 8.4*  HCT 32.2* 29.1*  MCV 88.7 86.4  PLT 349 109   Basic Metabolic Panel: Recent Labs  Lab 07/10/19 0756 07/11/19 0215 07/12/19 0301 07/13/19 0241 07/14/19 0237  NA 139 138 140 141 142  K 5.6* 4.7 4.4 4.0 3.9  CL 90* 90* 90* 91* 91*  CO2 37* 38* 39* 40* 39*  GLUCOSE 100* 138* 124* 120* 140*  BUN 74* 77* 70* 63* 60*  CREATININE 2.14* 2.28* 1.98* 1.84* 1.81*  CALCIUM 8.9 8.6* 8.8* 8.6* 8.5*  MG  --   --  2.4  --   --    GFR: Estimated Creatinine Clearance: 50.6 mL/min (A) (by C-G formula based on SCr of 1.81 mg/dL (H)).  CBG: Recent Labs  Lab 07/14/19 0615 07/14/19 1103 07/14/19  1553 07/14/19 2158 07/15/19 0605  GLUCAP 136* 165* 126* 143* 140*   Urine analysis:    Component Value Date/Time   COLORURINE STRAW (A) 07/08/2019 0008   APPEARANCEUR CLEAR 07/08/2019 0008   LABSPEC 1.006 07/08/2019 0008   PHURINE 6.0 07/08/2019 0008   GLUCOSEU NEGATIVE 07/08/2019 0008   HGBUR NEGATIVE 07/08/2019 0008   BILIRUBINUR NEGATIVE 07/08/2019 0008   KETONESUR NEGATIVE 07/08/2019 0008   PROTEINUR NEGATIVE 07/08/2019 0008   UROBILINOGEN 0.2 10/26/2014 2206   NITRITE NEGATIVE 07/08/2019 0008   LEUKOCYTESUR NEGATIVE 07/08/2019 0008    Recent Results (from the past 240 hour(s))  SARS Coronavirus 2 [facility] order, Performed in [facility] hospital lab) Nasopharyngeal Nasopharyngeal Swab     Status: None   Collection Time: 07/07/19  6:07 PM   Specimen: Nasopharyngeal Swab  Result Value Ref Range Status   SARS Coronavirus 2 NEGATIVE NEGATIVE Final    Comment: (NOTE) If result is NEGATIVE SARS-CoV-2 target nucleic acids are NOT DETECTED. The SARS-CoV-2 RNA is generally detectable in upper and lower  respiratory specimens during the acute phase of infection. The lowest  concentration of SARS-CoV-2 viral copies this assay can detect is 250  copies / mL. A negative result does not preclude SARS-CoV-2 infection  and should not be used as the sole basis for treatment or other  patient management decisions.  A negative result may occur with  improper specimen collection / handling, submission of specimen other  than nasopharyngeal swab, presence of viral mutation(s) within the  areas targeted by this assay, and inadequate number of viral copies  (<250 copies / mL). A negative result must be combined with clinical  observations, patient history, and epidemiological information. If result is POSITIVE SARS-CoV-2 target nucleic acids are DETECTED. The SARS-CoV-2 RNA is generally detectable in upper and lower  respiratory specimens dur ing the acute phase of infection.  Positive   results are indicative of active infection with SARS-CoV-2.  Clinical  correlation with patient history and other diagnostic information is  necessary to determine patient infection status.  Positive results do  not rule out bacterial infection or co-infection with other viruses. If result is PRESUMPTIVE POSTIVE SARS-CoV-2 nucleic acids MAY BE PRESENT.   A presumptive positive result was obtained on the submitted specimen  and confirmed on repeat testing.  While 2019 novel coronavirus  (SARS-CoV-2) nucleic acids may be present in the submitted sample  additional confirmatory testing may be necessary for epidemiological  and / or clinical management purposes  to differentiate  between  SARS-CoV-2 and other Sarbecovirus currently known to infect humans.  If clinically indicated additional testing with an alternate test  methodology [phone]) is advised. The SARS-CoV-2 RNA is generally  detectable in upper and lower respiratory sp ecimens during the acute  phase of infection. The expected result is Negative. Fact Sheet for Patients:  StrictlyIdeas.no Fact Sheet for Healthcare Providers: BankingDealers.co.za This test is not yet approved or cleared by the [place] FDA and has been authorized for detection and/or diagnosis of SARS-CoV-2 by FDA under an Emergency Use Authorization (EUA).  This EUA will remain in effect (meaning this test can be used) for the duration of the COVID-19 declaration under Section 564(b)(1) of the Act, 21 U.S.C. section 360bbb-3(b)(1), unless the authorization is terminated or revoked sooner. Performed at [facility], [place] [address] [place], [place] [address]     Radiology Studies: No results found.  Scheduled Meds: . apixaban  5 mg Oral BID  . aspirin  81 mg Oral Daily  . budesonide (PULMICORT) nebulizer solution  0.25 mg Nebulization BID  . carvedilol  3.125 mg Oral BID WC  . chlorhexidine  15  mL Mouth Rinse BID  . ferrous sulfate  325 mg Oral BID  . furosemide  80 mg Intravenous BID  . gabapentin  300 mg Oral BID  . guaiFENesin  600 mg Oral BID  . insulin aspart protamine- aspart  10 Units Subcutaneous BID WC  . mouth rinse  15 mL Mouth Rinse q12n4p  . mometasone-formoterol  2 puff Inhalation BID  . naLOXone (NARCAN)  injection  0.4 mg Intravenous Once  . oxybutynin  5 mg Oral QID  . pantoprazole  40 mg Oral BID  . pneumococcal 23 valent vaccine  0.5 mL Intramuscular Tomorrow-1000  . polyethylene glycol  17 g Oral BID  . rosuvastatin  20 mg Oral q1800  . sodium chloride flush  3 mL Intravenous Q12H  . tamsulosin  0.4 mg Oral QPC supper   Continuous Infusions: . sodium chloride       LOS: 7 days   Time spent: 35 minutes  Little Ishikawa, DO [facility] Pager: [email or web address] Password [facility] 07/15/2019, 7:14 AM

## 2019-07-16 LAB — BASIC METABOLIC PANEL
Anion gap: 11 (ref 5–15)
BUN: 48 mg/dL — ABNORMAL HIGH (ref 8–23)
CO2: 39 mmol/L — ABNORMAL HIGH (ref 22–32)
Calcium: 8.5 mg/dL — ABNORMAL LOW (ref 8.9–10.3)
Chloride: 92 mmol/L — ABNORMAL LOW (ref 98–111)
Creatinine, Ser: 1.7 mg/dL — ABNORMAL HIGH (ref 0.61–1.24)
GFR calc Af Amer: 46 mL/min — ABNORMAL LOW (ref >60)
GFR calc non Af Amer: 40 mL/min — ABNORMAL LOW (ref >60)
Glucose, Bld: 125 mg/dL — ABNORMAL HIGH (ref 70–99)
Potassium: 3.6 mmol/L (ref 3.5–5.1)
Sodium: 142 mmol/L (ref 135–145)

## 2019-07-16 LAB — CBC
HCT: 27.5 % — ABNORMAL LOW (ref 39.0–52.0)
Hemoglobin: 8.2 g/dL — ABNORMAL LOW (ref 13.0–17.0)
MCH: 25.8 pg — ABNORMAL LOW (ref 26.0–34.0)
MCHC: 29.8 g/dL — ABNORMAL LOW (ref 30.0–36.0)
MCV: 86.5 fL (ref 80.0–100.0)
Platelets: 305 10*3/uL (ref 150–400)
RBC: 3.18 MIL/uL — ABNORMAL LOW (ref 4.22–5.81)
RDW: 17.6 % — ABNORMAL HIGH (ref 11.5–15.5)
WBC: 5.9 10*3/uL (ref 4.0–10.5)
nRBC: 0 % (ref 0.0–0.2)

## 2019-07-16 LAB — GLUCOSE, CAPILLARY
Glucose-Capillary: 117 mg/dL — ABNORMAL HIGH (ref 70–99)
Glucose-Capillary: 162 mg/dL — ABNORMAL HIGH (ref 70–99)
Glucose-Capillary: 155 mg/dL — ABNORMAL HIGH (ref 70–99)
Glucose-Capillary: 130 mg/dL — ABNORMAL HIGH (ref 70–99)

## 2019-07-16 NOTE — Plan of Care (Signed)

## 2019-07-16 NOTE — Progress Notes (Signed)
PROGRESS NOTE    Nicholas Caldwell   HWE:[id number]  DOB: 1946/06/13  DOA: 07/07/2019 PCP: [facility]   Brief Narrative:  Nicholas Caldwell   is a 73 y.o. male who is wheelchair bound  w hypertension, hyperlipidemia, Paroxysmal afib, CAD, CHF (EF 40-45%) , Dm2, w neuropathy, Copd on home o2 3L [place],  OSA, w recent admission 06/08/2019 for acute on chronic respiratory failure secondary to CHF presents with dyspnea for the past 3 week, as well as weight gain of about 15lbs per pt. His breathing has been worse for the past 3 days. Pulse ox was 74% on room air. CXR > Cardiomegaly with interstitial edema and layering effusions. Pro BNP. 2,211.9 Troponin 37. He was last admitted from 8/5-8/10 for CHF exacerbation.  He developed acute respiratory failure on 9/5 and required BiPAP x24 hours.  I had an extensive discussion with him about wearing his CPAP at night every night.  He declined his CPAP again last night and took 10 mg of oxycodone this morning after which he feels dizzy.   Subjective: No acute issues or events overnight. Declines chest pain, nausea, vomiting, diarrhea, headache, fevers, chills.  Assessment & Plan: Principal Problem:   Acute on chronic combined systolic and diastolic CHF (congestive heart failure) (HCC) Active Problems:   Essential hypertension   Type 2 diabetes mellitus with neuropathy   Elevated troponin I level   PAF (paroxysmal atrial fibrillation) (HCC)   Acute CHF (congestive heart failure) ([place])   Palliative care by specialist   DNR (do not resuscitate) discussion   Fatigue   Acute on chronic diastolic CHF and RV failure patient with severe COPD, obesity hypoventilation and obstructive sleep apnea, minimally improving - Patient's baseline oxygen is 3 L nasal cannula around-the-clock, continues to require 4-5 L nasal cannula and CPAP -continue to wean oxygen as tolerated - Last known dry weight was 116 kg on 8/10- now 122 kg -downtrending appropriately - Cont to  diurese with 80 mg IV Lasix twice daily until euvolemic and symptoms have resolved- EF 40-45 %, diffuse LV hypokinesis, RV was severely enlarged -Creatinine stable around 1.7 -monitor closely given aggressive diuresis Intake/Output Summary (Last 24 hours) at 07/16/2019 0727 Last data filed at 07/16/2019 8938 Gross per 24 hour  Intake 1145 ml  Output 3250 ml  Net -2105 ml  -Net - 6.7 L since admission - current ECHO shows EF has improved to 60% and RV function mildly reduced -Dietary discussion with patient yesterday, patient appears to have better grasp on his dietary restrictions at this point.  Acute hypoxia on chronic hypoxic respiratory failure, ongoing Concurrent hypercapnic respiratory failure likely in the setting of obesity hypoventilation, chronic COPD, and acute HF exacerbation. -Baseline oxygen requirements 3 L nasal cannula around-the-clock, maintains 4-5L nasal cannula and CPAP to maintain O2 above 90% even at rest  -Somewhat lethargic on 07/09/2019, CO2 noted to be 108 -minimal improvement with Narcan - improved with BiPap/Cpap  COPD with chronic hypoxia, not in acute exacerbation - Severe, on 3L [place] oxygen at home 24h/day.   Morbid obesity  OHS/OSA Body mass index is 36.93 kg/m. -Continue CPAP -he admits that he does not wear his CPAP at least 3 nights out of the week which may be causing his CHF exacerbations- we have had a discussion about this but he continues to be resistant to wearing it  Chronic pedal edema - He wears Unna boots/wraps - keep elevated as possilbe  Borderline hypotension - without symptoms - BP remains somewhat  low, systolic around 454 this morning -Continue to hold BiDil in the mean time while diuresing  PAF, Chadsvasc 7 - Was seen in past by Dr. Rayann Heman, not thought to be good ablation candidate. He has not been on Tikosyn or amiodarone due to history of long QT.  - Continue Eliquis.  - in sinus rhythm with short episode of v tach as below  V  tach on 9/4 - likely due to CHF exacerbation   QTc checked and was no longer high>  480- started low dose Coreg- V tach has resolved- last episode was on 9/5 -Repeated EKG on 9/7-QTC was 401  Chronic pain -Resume home Percocet at 10 mg, previously reduced to 5 mg due to hypercarbia, if patient has recurrent episode of hypoxia or hypercarbia will likely need to discontinue narcotics completely.  Chronic kidney disease stage III-IV - Creatinine downtrending appropriately with diuresis   Constipation  -Fleets enema x1, increase MiraLAX now scheduled -Follow for BM  Type 2 diabetes mellitus with neuropathy - cont 70/30 - sugars controlled  DVT prophylaxis: Eliquis Code Status: Full code Family Communication:  Disposition Plan: home+HH vs SNF-he reports being wheelchair-bound due to weakness/obesity but is improving with PT-tentative disposition home versus SNF pending clinical improvement over the next few days.  Consultants:   None  Procedures:  2 D ECHO 1. The left ventricle has hyperdynamic systolic function, with an ejection fraction of >65%. The cavity size was normal. There is mildly increased left ventricular wall thickness. Left ventricular diastolic Doppler parameters are indeterminate. No  evidence of left ventricular regional wall motion abnormalities.  2. The right ventricle has mildly reduced systolic function. The cavity was moderately enlarged. There is not assessed.  3. Left atrial size was mildly dilated.  4. Right atrial size was moderately dilated.  5. The aortic valve is tricuspid. No stenosis of the aortic valve.  6. The aorta is not well visualized unless otherwise noted.  7. The inferior vena cava was dilated in size with <50% respiratory variability.  8. The interatrial septum was not well visualized.  Objective: Vitals:   07/15/19 2302 07/16/19 0300 07/16/19 0414 07/16/19 0518  BP: 106/60 (!) 108/54    Pulse: 70 69    Resp: (!) _0 Temp: 97.8  F (36.6 C) 98.2 F (36.8 C)    TempSrc: Axillary Axillary    SpO2: 96% 95% 96%   Weight:    122.9 kg  Height:        Intake/Output Summary (Last 24 hours) at 07/16/2019 0727 Last data filed at 07/16/2019 0519 Gross per 24 hour  Intake 1145 ml  Output 3250 ml  Net -2105 ml   Filed Weights   07/14/19 0345 07/15/19 0500 07/16/19 0518  Weight: 126.2 kg 122.5 kg 122.9 kg    Physical examination: General:  Pleasantly resting in bed, No acute distress. HEENT:  Normocephalic atraumatic.  Sclerae nonicteric, noninjected.  Extraocular movements intact bilaterally. Neck:  Without mass or deformity.  Trachea is midline. Lungs: Scant bibasilar rales without overt rhonchi or wheeze. Heart:  Regular rate and rhythm.  Without murmurs, rubs, or gallops. Abdomen:  Soft, nontender, nondistended.  Without guarding or rebound. Extremities: 1-2 pitting edema to the knees bilaterally, without cyanosis, clubbing, or obvious deformity. Vascular:  Dorsalis pedis and posterior tibial pulses palpable bilaterally. Skin:  Warm and dry, no erythema, no ulcerations.   Data Reviewed:  I have personally reviewed following labs and imaging studies  CBC: Recent Labs  Lab 07/10/19 0756 07/14/19 0237 07/15/19 0652 07/16/19 0233  WBC 5.3 5.7 5.7 5.9  HGB 9.1* 8.4* 8.7* 8.2*  HCT 32.2* 29.1* 29.5* 27.5*  MCV 88.7 86.4 86.0 86.5  PLT 349 324 324 295   Basic Metabolic Panel: Recent Labs  Lab 07/12/19 0301 07/13/19 0241 07/14/19 0237 07/15/19 0652 07/16/19 0233  NA 140 141 142 144 142  K 4.4 4.0 3.9 3.8 3.6  CL 90* 91* 91* 94* 92*  CO2 39* 40* 39* 37* 39*  GLUCOSE 124* 120* 140* 118* 125*  BUN 70* 63* 60* 54* 48*  CREATININE 1.98* 1.84* 1.81* 1.67* 1.70*  CALCIUM 8.8* 8.6* 8.5* 8.7* 8.5*  MG 2.4  --   --   --   --    GFR: Estimated Creatinine Clearance: 53.9 mL/min (A) (by C-G formula based on SCr of 1.7 mg/dL (H)).  CBG: Recent Labs  Lab 07/15/19 0605 07/15/19 1123 07/15/19 1624  07/15/19 2151 07/16/19 0627  GLUCAP 140* 116* 169* 145* 117*   Urine analysis:    Component Value Date/Time   COLORURINE STRAW (A) 07/08/2019 0008   APPEARANCEUR CLEAR 07/08/2019 0008   LABSPEC 1.006 07/08/2019 0008   PHURINE 6.0 07/08/2019 0008   GLUCOSEU NEGATIVE 07/08/2019 0008   HGBUR NEGATIVE 07/08/2019 0008   BILIRUBINUR NEGATIVE 07/08/2019 0008   KETONESUR NEGATIVE 07/08/2019 0008   PROTEINUR NEGATIVE 07/08/2019 0008   UROBILINOGEN 0.2 10/26/2014 2206   NITRITE NEGATIVE 07/08/2019 0008   LEUKOCYTESUR NEGATIVE 07/08/2019 0008    Recent Results (from the past 240 hour(s))  SARS Coronavirus 2  [facility] order, Performed in [facility] hospital lab) Nasopharyngeal Nasopharyngeal Swab     Status: None   Collection Time: 07/07/19  6:07 PM   Specimen: Nasopharyngeal Swab  Result Value Ref Range Status   SARS Coronavirus 2 NEGATIVE NEGATIVE Final    Comment: (NOTE) If result is NEGATIVE SARS-CoV-2 target nucleic acids are NOT DETECTED. The SARS-CoV-2 RNA is generally detectable in upper and lower  respiratory specimens during the acute phase of infection. The lowest  concentration of SARS-CoV-2 viral copies this assay can detect is 250  copies / mL. A negative result does not preclude SARS-CoV-2 infection  and should not be used as the sole basis for treatment or other  patient management decisions.  A negative result may occur with  improper specimen collection / handling, submission of specimen other  than nasopharyngeal swab, presence of viral mutation(s) within the  areas targeted by this assay, and inadequate number of viral copies  (<250 copies / mL). A negative result must be combined with clinical  observations, patient history, and epidemiological information. If result is POSITIVE SARS-CoV-2 target nucleic acids are DETECTED. The SARS-CoV-2 RNA is generally detectable in upper and lower  respiratory specimens dur ing the acute phase of infection.  Positive   results are indicative of active infection with SARS-CoV-2.  Clinical  correlation with patient history and other diagnostic information is  necessary to determine patient infection status.  Positive results do  not rule out bacterial infection or co-infection with other viruses. If result is PRESUMPTIVE POSTIVE SARS-CoV-2 nucleic acids MAY BE PRESENT.   A presumptive positive result was obtained on the submitted specimen  and confirmed on repeat testing.  While 2019 novel coronavirus  (SARS-CoV-2) nucleic acids may be present in the submitted sample  additional confirmatory testing may be necessary for epidemiological  and / or clinical management purposes  to differentiate between  SARS-CoV-2 and other Sarbecovirus currently known to  infect humans.  If clinically indicated additional testing with an alternate test  methodology [phone]) is advised. The SARS-CoV-2 RNA is generally  detectable in upper and lower respiratory sp ecimens during the acute  phase of infection. The expected result is Negative. Fact Sheet for Patients:  StrictlyIdeas.no Fact Sheet for Healthcare Providers: BankingDealers.co.za This test is not yet approved or cleared by the [place] FDA and has been authorized for detection and/or diagnosis of SARS-CoV-2 by FDA under an Emergency Use Authorization (EUA).  This EUA will remain in effect (meaning this test can be used) for the duration of the COVID-19 declaration under Section 564(b)(1) of the Act, 21 U.S.C. section 360bbb-3(b)(1), unless the authorization is terminated or revoked sooner. Performed at [facility], [place] [address] [place], [place] [address]     Radiology Studies: No results found.  Scheduled Meds: . apixaban  5 mg Oral BID  . aspirin  81 mg Oral Daily  . budesonide (PULMICORT) nebulizer solution  0.25 mg Nebulization BID  . carvedilol  3.125 mg Oral BID WC  . chlorhexidine  15  mL Mouth Rinse BID  . ferrous sulfate  325 mg Oral BID  . furosemide  80 mg Intravenous BID  . gabapentin  300 mg Oral BID  . guaiFENesin  600 mg Oral BID  . insulin aspart protamine- aspart  10 Units Subcutaneous BID WC  . mouth rinse  15 mL Mouth Rinse q12n4p  . mometasone-formoterol  2 puff Inhalation BID  . naLOXone (NARCAN)  injection  0.4 mg Intravenous Once  . oxybutynin  5 mg Oral QID  . pantoprazole  40 mg Oral BID  . pneumococcal 23 valent vaccine  0.5 mL Intramuscular Tomorrow-1000  . polyethylene glycol  17 g Oral BID  . rosuvastatin  20 mg Oral q1800  . sodium chloride flush  3 mL Intravenous Q12H  . tamsulosin  0.4 mg Oral QPC supper   Continuous Infusions: . sodium chloride       LOS: 8 days   Time spent: 35 minutes  Little Ishikawa, DO [facility] Pager: [email or web address] Password [facility] 07/16/2019, 7:27 AM

## 2019-07-16 NOTE — Progress Notes (Signed)
Pt got his pain medicine throughout the day for a complain of hip and back pain, he was on and off to the bed and recliner couple of times, diuresing well, Maalox given for one time heart burn complain, running A-fib with PVC's, wife was in bed side and updated, Unna boot is in [place], will continue to monitor the patient  [place] Holter, Therapist, sports

## 2019-07-17 DIAGNOSIS — J9621 Acute and chronic respiratory failure with hypoxia: Secondary | ICD-10-CM

## 2019-07-17 DIAGNOSIS — J9622 Acute and chronic respiratory failure with hypercapnia: Secondary | ICD-10-CM

## 2019-07-17 DIAGNOSIS — J441 Chronic obstructive pulmonary disease with (acute) exacerbation: Secondary | ICD-10-CM

## 2019-07-17 DIAGNOSIS — G4733 Obstructive sleep apnea (adult) (pediatric): Secondary | ICD-10-CM

## 2019-07-17 LAB — CBC
HCT: 27.4 % — ABNORMAL LOW (ref 39.0–52.0)
Hemoglobin: 8.1 g/dL — ABNORMAL LOW (ref 13.0–17.0)
MCH: 25.5 pg — ABNORMAL LOW (ref 26.0–34.0)
MCHC: 29.6 g/dL — ABNORMAL LOW (ref 30.0–36.0)
MCV: 86.2 fL (ref 80.0–100.0)
Platelets: 299 10*3/uL (ref 150–400)
RBC: 3.18 MIL/uL — ABNORMAL LOW (ref 4.22–5.81)
RDW: 17.7 % — ABNORMAL HIGH (ref 11.5–15.5)
WBC: 6 10*3/uL (ref 4.0–10.5)
nRBC: 0 % (ref 0.0–0.2)

## 2019-07-17 LAB — GLUCOSE, CAPILLARY
Glucose-Capillary: 123 mg/dL — ABNORMAL HIGH (ref 70–99)
Glucose-Capillary: 164 mg/dL — ABNORMAL HIGH (ref 70–99)
Glucose-Capillary: 191 mg/dL — ABNORMAL HIGH (ref 70–99)
Glucose-Capillary: 240 mg/dL — ABNORMAL HIGH (ref 70–99)

## 2019-07-17 LAB — BASIC METABOLIC PANEL
Anion gap: 11 (ref 5–15)
BUN: 46 mg/dL — ABNORMAL HIGH (ref 8–23)
CO2: 39 mmol/L — ABNORMAL HIGH (ref 22–32)
Calcium: 8.5 mg/dL — ABNORMAL LOW (ref 8.9–10.3)
Chloride: 91 mmol/L — ABNORMAL LOW (ref 98–111)
Creatinine, Ser: 1.77 mg/dL — ABNORMAL HIGH (ref 0.61–1.24)
GFR calc Af Amer: 44 mL/min — ABNORMAL LOW (ref >60)
GFR calc non Af Amer: 38 mL/min — ABNORMAL LOW (ref >60)
Glucose, Bld: 129 mg/dL — ABNORMAL HIGH (ref 70–99)
Potassium: 3.7 mmol/L (ref 3.5–5.1)
Sodium: 141 mmol/L (ref 135–145)

## 2019-07-17 MED ORDER — METHYLPREDNISOLONE SODIUM SUCC 40 MG IJ SOLR
40.0000 mg | Freq: Two times a day (BID) | INTRAMUSCULAR | Status: DC
  Administered 2019-07-17 – 2019-07-20 (×7): 40 mg via INTRAVENOUS
  Filled 2019-07-17 (×9): qty 1

## 2019-07-17 NOTE — Progress Notes (Signed)
Pt got his pain medicine throughout the day for a complain of hip and back pain, he was on and off to the bed and recliner couple of times, diuresing well, Catheter pouch changed twice   Maalox given for once for heart burn, running A-fib with PVC's, wife is in bed side and updated, Unna boot is in [place], will continue to monitor the patient  [place] Holter, Therapist, sports

## 2019-07-17 NOTE — Plan of Care (Signed)

## 2019-07-17 NOTE — Progress Notes (Signed)
PROGRESS NOTE    Nicholas Caldwell  LGX:[id number]  DOB: [birth year]  DOA: 07/07/2019 PCP: [facility]  Brief Narrative:  73 y.o.male who is wheelchair boundw hypertension, hyperlipidemia, Paroxysmal afib, CAD, CHF(EF 40-45%), Dm2, w neuropathy,[place] home o2 3L [place],  OSA, w recent admission 06/08/2019 for acute on chronic respiratory failure secondary to CHF presents with dyspnea for the past 3 week, as well as weight gain of about 15lbs per pt. His breathing has been worse for the past 3 days. Pulse ox was 74% on room air. CXR > Cardiomegaly with interstitial edema and layering effusions. Pro BNP. 2,211.9 Troponin 37. He was last admitted from 8/5-8/10 for CHF exacerbation.  He developed acute respiratory failure on 9/5 and required BiPAP x24 hours. He has been declining CPAP at night in spite of extensive discussions by Hospitalist team,dizzy at times after oxycodone use.  Subjective: Patient sitting in bedside chair. Says feels overall better but still has exertional dyspnea. Reports leg swellings feel the same.. Noted to be on 6lits o2.   Objective: Vitals:   07/17/19 0646 07/17/19 0747 07/17/19 0748 07/17/19 0751  BP:    (!) 108/59  Pulse:    86  Resp: (!) 22   20  Temp:    98.8 F (37.1 C)  TempSrc:    Oral  SpO2: 94% 93% 94% 94%  Weight: 124.1 kg     Height:        Intake/Output Summary (Last 24 hours) at 07/17/2019 0829 Last data filed at 07/16/2019 2325 Gross per 24 hour  Intake 1005 ml  Output 1150 ml  Net -145 ml   Filed Weights   07/15/19 0500 07/16/19 0518 07/17/19 0646  Weight: 122.5 kg 122.9 kg 124.1 kg    Physical Examination:  General exam: Appears calm and comfortable  Respiratory system: Scattered wheezing on auscultation. Respiratory effort normal. Cardiovascular system: S1 & S2 heard, RRR. No JVD, murmurs, rubs, gallops or clicks. 2+ pitting pedal edema. Gastrointestinal system: Abdomen is nondistended, soft and nontender. No organomegaly or  masses felt. Normal bowel sounds heard. Central nervous system: Alert and oriented. No focal neurological deficits. Extremities: Bilateral lower extremity edema, Unna boots/Wraps in place Psychiatry: Judgement and insight appear normal. Mood & affect appropriate.     Data Reviewed: I have personally reviewed following labs and imaging studies  CBC: Recent Labs  Lab 07/14/19 0237 07/15/19 0652 07/16/19 0233 07/17/19 0216  WBC 5.7 5.7 5.9 6.0  HGB 8.4* 8.7* 8.2* 8.1*  HCT 29.1* 29.5* 27.5* 27.4*  MCV 86.4 86.0 86.5 86.2  PLT 324 324 305 814   Basic Metabolic Panel: Recent Labs  Lab 07/12/19 0301 07/13/19 0241 07/14/19 0237 07/15/19 0652 07/16/19 0233 07/17/19 0216  NA 140 141 142 144 142 141  K 4.4 4.0 3.9 3.8 3.6 3.7  CL 90* 91* 91* 94* 92* 91*  CO2 39* 40* 39* 37* 39* 39*  GLUCOSE 124* 120* 140* 118* 125* 129*  BUN 70* 63* 60* 54* 48* 46*  CREATININE 1.98* 1.84* 1.81* 1.67* 1.70* 1.77*  CALCIUM 8.8* 8.6* 8.5* 8.7* 8.5* 8.5*  MG 2.4  --   --   --   --   --    GFR: Estimated Creatinine Clearance: 52.1 mL/min (A) (by C-G formula based on SCr of 1.77 mg/dL (H)). Liver Function Tests: No results for input(s): AST, ALT, ALKPHOS, BILITOT, PROT, ALBUMIN in the last 168 hours. No results for input(s): LIPASE, AMYLASE in the last 168 hours. No results for input(s): AMMONIA  in the last 168 hours. Coagulation Profile: No results for input(s): INR, PROTIME in the last 168 hours. Cardiac Enzymes: No results for input(s): CKTOTAL, CKMB, CKMBINDEX, TROPONINI in the last 168 hours. BNP (last 3 results) No results for input(s): PROBNP in the last 8760 hours. HbA1C: No results for input(s): HGBA1C in the last 72 hours. CBG: Recent Labs  Lab 07/16/19 0627 07/16/19 1124 07/16/19 1542 07/16/19 2122 07/17/19 0638  GLUCAP 117* 162* 155* 130* 123*   Lipid Profile: No results for input(s): CHOL, HDL, LDLCALC, TRIG, CHOLHDL, LDLDIRECT in the last 72 hours. Thyroid Function  Tests: No results for input(s): TSH, T4TOTAL, FREET4, T3FREE, THYROIDAB in the last 72 hours. Anemia Panel: No results for input(s): VITAMINB12, FOLATE, FERRITIN, TIBC, IRON, RETICCTPCT in the last 72 hours. Sepsis Labs: No results for input(s): PROCALCITON, LATICACIDVEN in the last 168 hours.  Recent Results (from the past 240 hour(s))  SARS Coronavirus 2 [facility] order, Performed in [facility] hospital lab) Nasopharyngeal Nasopharyngeal Swab     Status: None   Collection Time: 07/07/19  6:07 PM   Specimen: Nasopharyngeal Swab  Result Value Ref Range Status   SARS Coronavirus 2 NEGATIVE NEGATIVE Final    Comment: (NOTE) If result is NEGATIVE SARS-CoV-2 target nucleic acids are NOT DETECTED. The SARS-CoV-2 RNA is generally detectable in upper and lower  respiratory specimens during the acute phase of infection. The lowest  concentration of SARS-CoV-2 viral copies this assay can detect is 250  copies / mL. A negative result does not preclude SARS-CoV-2 infection  and should not be used as the sole basis for treatment or other  patient management decisions.  A negative result may occur with  improper specimen collection / handling, submission of specimen other  than nasopharyngeal swab, presence of viral mutation(s) within the  areas targeted by this assay, and inadequate number of viral copies  (<250 copies / mL). A negative result must be combined with clinical  observations, patient history, and epidemiological information. If result is POSITIVE SARS-CoV-2 target nucleic acids are DETECTED. The SARS-CoV-2 RNA is generally detectable in upper and lower  respiratory specimens dur ing the acute phase of infection.  Positive  results are indicative of active infection with SARS-CoV-2.  Clinical  correlation with patient history and other diagnostic information is  necessary to determine patient infection status.  Positive results do  not rule out bacterial infection or co-infection  with other viruses. If result is PRESUMPTIVE POSTIVE SARS-CoV-2 nucleic acids MAY BE PRESENT.   A presumptive positive result was obtained on the submitted specimen  and confirmed on repeat testing.  While 2019 novel coronavirus  (SARS-CoV-2) nucleic acids may be present in the submitted sample  additional confirmatory testing may be necessary for epidemiological  and / or clinical management purposes  to differentiate between  SARS-CoV-2 and other Sarbecovirus currently known to infect humans.  If clinically indicated additional testing with an alternate test  methodology [phone]) is advised. The SARS-CoV-2 RNA is generally  detectable in upper and lower respiratory sp ecimens during the acute  phase of infection. The expected result is Negative. Fact Sheet for Patients:  StrictlyIdeas.no Fact Sheet for Healthcare Providers: BankingDealers.co.za This test is not yet approved or cleared by the [place] FDA and has been authorized for detection and/or diagnosis of SARS-CoV-2 by FDA under an Emergency Use Authorization (EUA).  This EUA will remain in effect (meaning this test can be used) for the duration of the COVID-19 declaration under Section 564(b)(1) of  the Act, 21 U.S.C. section 360bbb-3(b)(1), unless the authorization is terminated or revoked sooner. Performed at [facility], [place] [address], [place], [place] [address]       Radiology Studies: No results found.      Scheduled Meds: . apixaban  5 mg Oral BID  . aspirin  81 mg Oral Daily  . budesonide (PULMICORT) nebulizer solution  0.25 mg Nebulization BID  . carvedilol  3.125 mg Oral BID WC  . chlorhexidine  15 mL Mouth Rinse BID  . ferrous sulfate  325 mg Oral BID  . furosemide  80 mg Intravenous BID  . gabapentin  300 mg Oral BID  . guaiFENesin  600 mg Oral BID  . insulin aspart protamine- aspart  10 Units Subcutaneous BID WC  . mouth rinse  15 mL Mouth  Rinse q12n4p  . mometasone-formoterol  2 puff Inhalation BID  . naLOXone (NARCAN)  injection  0.4 mg Intravenous Once  . oxybutynin  5 mg Oral QID  . pantoprazole  40 mg Oral BID  . pneumococcal 23 valent vaccine  0.5 mL Intramuscular Tomorrow-1000  . polyethylene glycol  17 g Oral BID  . rosuvastatin  20 mg Oral q1800  . sodium chloride flush  3 mL Intravenous Q12H  . tamsulosin  0.4 mg Oral QPC supper   Continuous Infusions: . sodium chloride      Assessment & Plan:    1.Acute hypoxia on chronic hypoxic/hypercapneic respiratory failure: Multifactorial in the setting of acute on chronic diastolic CHF and RV failure patient with underlying severe COPD, obesity hypoventilation,obstructive sleep apnea and opiate dependency.Somewhat lethargic on 07/09/2019, CO2 noted to be 108 -minimal improvement with Narcan - improved with BiPap/Cpap. Patient's baseline oxygen is 3 L nasal cannula around-the-clock, continues to require 4 L nasal cannula and CPAP -continue to wean oxygen as tolerated. On IV diuresis/CPAP qhs/ steroid inhalers but not on systemic steroids. Add IV steroids as wheezing.  2.Acute on chronic diastolic CHF and RV failure : Cont to diurese with 80 mg IV Lasix twice daily until euvolemic and symptoms have resolved- EF 40-45 %, diffuse LV hypokinesis, RV was severely enlarged- current ECHO shows EF has improved to 60% and RV function mildly reduced.BNP 2100 ON 9/3. Daily weights/Input/output, low salt diet. Watch BP, BIDIL on hold. Continue Coreg/asa  3. Morbid obesity  With OHS/OSA: Body mass index is 36.93 kg/m. -Continue CPAP -he admits that he does not wear his CPAP at least 3 nights out of the week which may be causing his CHF exacerbations- we have had a discussion about this but he continues to be resistant to wearing it. Reasserted the need for compliance again today.   4. COPD with chronic hypoxia, in acute mild exacerbation- Severe, on 3L-4lits [place] oxygen at home 24h/day.  Sometimes goes upto 5 lits on exertion per patient. Currently requiring 6 lits 02 at rest and wheezing on exam. Add IV steroids.  5. Chronic Venous leg edema- He wears Unna boots/wraps - keep elevated as possilbe  6.PAF, Chadsvasc 7, NSVT - Was seen in past by Dr. Rayann Heman, not thought to be good ablation candidate. He has not been on Tikosyn or amiodarone due to history of long QT. - Continue Eliquis. Patient mostly in sinus rhythm with short episodes of v tach. Had an episode of NSVT on 9/4.QTc checked and was no longer high>  480- started low dose Coreg- V tach has resolved- last episode was on 9/5-Repeated EKG on 9/7-QTC was 401  7.  Chronic pain/Opiate dependence: Like contributing to #1. Currently on home Percocet at 10 mg, previously reduced to 5 mg due to hypercarbia, if patient has recurrent episode of hypoxia or hypercarbia will likely need to discontinue narcotics completely.  8.Chronic kidney disease stage III-IV- Creatinine downtrending with diuresis   9.Type 2 diabetes mellitus with neuropathy- cont 70/30 - sugars controlled  DVT prophylaxis: Eliquis Code Status: Full code Family / Patient Communication: d/w patient Disposition Plan: Home when medically cleared in next 24-48 hours     LOS: 9 days    Time spent: 64 min    Guilford Shi, MD [facility] Pager [phone]  If 7PM-7AM, please contact night-coverage [email or web address] Password [facility] 07/17/2019, 8:29 AM

## 2019-07-18 ENCOUNTER — Inpatient Hospital Stay (HOSPITAL_COMMUNITY): Payer: No Typology Code available for payment source

## 2019-07-18 LAB — BASIC METABOLIC PANEL
Anion gap: 10 (ref 5–15)
BUN: 47 mg/dL — ABNORMAL HIGH (ref 8–23)
CO2: 40 mmol/L — ABNORMAL HIGH (ref 22–32)
Calcium: 8.7 mg/dL — ABNORMAL LOW (ref 8.9–10.3)
Chloride: 90 mmol/L — ABNORMAL LOW (ref 98–111)
Creatinine, Ser: 1.86 mg/dL — ABNORMAL HIGH (ref 0.61–1.24)
GFR calc Af Amer: 41 mL/min — ABNORMAL LOW (ref >60)
GFR calc non Af Amer: 36 mL/min — ABNORMAL LOW (ref >60)
Glucose, Bld: 208 mg/dL — ABNORMAL HIGH (ref 70–99)
Potassium: 3.9 mmol/L (ref 3.5–5.1)
Sodium: 140 mmol/L (ref 135–145)

## 2019-07-18 LAB — GLUCOSE, CAPILLARY
Glucose-Capillary: 235 mg/dL — ABNORMAL HIGH (ref 70–99)
Glucose-Capillary: 303 mg/dL — ABNORMAL HIGH (ref 70–99)
Glucose-Capillary: 278 mg/dL — ABNORMAL HIGH (ref 70–99)
Glucose-Capillary: 330 mg/dL — ABNORMAL HIGH (ref 70–99)

## 2019-07-18 LAB — CBC
HCT: 28.2 % — ABNORMAL LOW (ref 39.0–52.0)
Hemoglobin: 8.3 g/dL — ABNORMAL LOW (ref 13.0–17.0)
MCH: 25.3 pg — ABNORMAL LOW (ref 26.0–34.0)
MCHC: 29.4 g/dL — ABNORMAL LOW (ref 30.0–36.0)
MCV: 86 fL (ref 80.0–100.0)
Platelets: 321 10*3/uL (ref 150–400)
RBC: 3.28 MIL/uL — ABNORMAL LOW (ref 4.22–5.81)
RDW: 17.6 % — ABNORMAL HIGH (ref 11.5–15.5)
WBC: 4.7 10*3/uL (ref 4.0–10.5)
nRBC: 0.4 % — ABNORMAL HIGH (ref 0.0–0.2)

## 2019-07-18 LAB — PROCALCITONIN: Procalcitonin: 0.15 ng/mL

## 2019-07-18 LAB — BRAIN NATRIURETIC PEPTIDE: B Natriuretic Peptide: 2833.9 pg/mL — ABNORMAL HIGH (ref 0.0–100.0)

## 2019-07-18 MED ORDER — IPRATROPIUM BROMIDE 0.02 % IN SOLN
0.5000 mg | Freq: Four times a day (QID) | RESPIRATORY_TRACT | Status: DC
  Administered 2019-07-18 – 2019-07-20 (×8): 0.5 mg via RESPIRATORY_TRACT
  Filled 2019-07-18 (×7): qty 2.5

## 2019-07-18 MED ORDER — INSULIN ASPART 100 UNIT/ML ~~LOC~~ SOLN
5.0000 [IU] | Freq: Three times a day (TID) | SUBCUTANEOUS | Status: DC
  Administered 2019-07-18 – 2019-07-19 (×3): 5 [IU] via SUBCUTANEOUS

## 2019-07-18 MED ORDER — INSULIN ASPART PROT & ASPART (70-30 MIX) 100 UNIT/ML ~~LOC~~ SUSP
13.0000 [IU] | Freq: Two times a day (BID) | SUBCUTANEOUS | Status: DC
  Administered 2019-07-18 – 2019-07-19 (×2): 13 [IU] via SUBCUTANEOUS

## 2019-07-18 MED ORDER — LEVALBUTEROL HCL 0.63 MG/3ML IN NEBU
0.6300 mg | INHALATION_SOLUTION | Freq: Four times a day (QID) | RESPIRATORY_TRACT | Status: DC
  Administered 2019-07-18 – 2019-07-20 (×8): 0.63 mg via RESPIRATORY_TRACT
  Filled 2019-07-18 (×7): qty 3

## 2019-07-18 NOTE — Progress Notes (Signed)
Inpatient Diabetes Program Recommendations  AACE/ADA: New Consensus Statement on Inpatient Glycemic Control   Target Ranges:  Prepandial:   less than 140 mg/dL      Peak postprandial:   less than 180 mg/dL (1-2 hours)      Critically ill patients:  140 - 180 mg/dL   Results for Nicholas Caldwell, Nicholas Caldwell (MRN [id number]) as of 07/18/2019 11:18  Ref. Range 07/17/2019 06:38 07/17/2019 10:57 07/17/2019 15:40 07/17/2019 21:43 07/18/2019 06:21 07/18/2019 10:48  Glucose-Capillary Latest Ref Range: 70 - 99 mg/dL 123 (H) 164 (H) 191 (H) 240 (H) 235 (H) 303 (H)   Review of Glycemic Control  Diabetes history: DM2 Outpatient Diabetes medications: 70/30 10 units BID, Novolog 4 units TID with meals Current orders for Inpatient glycemic control: 70/30 10 units BID; Solumedrol 40 mg Q12H  Inpatient Diabetes Program Recommendations:   Insulin-Basal: If steroids are continued, please consider increasing 70/30 to 13 units BID.  Insulin-Correction: Please consider ordering Novolog 0-9 units TID with meals and Novolog 0-5 units QHS.  Thanks, Barnie Alderman, RN, MSN, CDE Diabetes Coordinator Inpatient Diabetes Program [phone] (Team Pager from 8am to 5pm)

## 2019-07-18 NOTE — Progress Notes (Signed)
PROGRESS NOTE    Cornell Bourbon  TIR:[id number] DOB: 18-Nov-1945 DOA: 07/07/2019 [facility]CP: [facility]   Brief Narrative:  73 y.o.male who is wheelchair boundw hypertension, hyperlipidemia, Paroxysmal afib, CAD, CHF(EF 40-45%), Dm2, w neuropathy,[place] home o2 3L [place], OSA, w recent admission 06/08/2019 for acute on chronic respiratory failure secondary to CHF presents with dyspnea for the past 3 week, as well as weight gain of about 15lbs per pt. His breathing has been worse for the past 3 days. Pulse ox was 74% on room air. CXR >Cardiomegaly with interstitial edema and layering effusions. Pro BNP. 2,211.9 Troponin 37. He was last admitted from 8/5-8/10 for CHF exacerbation. He developed acute respiratory failure on 9/5 and required BiPAP x24 hours.He has been declining CPAP at night in spite of extensive discussions by Hospitalist team,dizzy at times after oxycodone use.  During the hospitalization patient was aggressively been diuresed and also received steroids with bronchodilators for COPD exacerbation.   Assessment & Plan:   Principal Problem:   Acute on chronic combined systolic and diastolic CHF (congestive heart failure) (HCC) Active Problems:   Essential hypertension   Type 2 diabetes mellitus with neuropathy   Elevated troponin I level   PAF (paroxysmal atrial fibrillation) (HCC)   Acute CHF (congestive heart failure) ([place])   Palliative care by specialist   DNR (do not resuscitate) discussion   Fatigue  Acute on chronic hypoxic respiratory failure, baseline 3 L nasal cannula.  Currently 6 L nasal cannula -Multifactorial with COPD exacerbation and fluid overload. -Each issues discussed below. -Check chest x-ray  Acute congestive heart failure with preserved ejection fraction, RV failure, ejection fraction 65% - Continue 80 mg of IV Lasix twice daily, monitor urine output -Closely monitor renal function.  Continue Coreg and aspirin -BiDil on hold.  Will resume when  appropriate  Acute on chronic exacerbation of COPD - We will change Xopenex and ipratropium to scheduled -Discontinue Pulmicort, continue Dulera -Continue Solu-Medrol 40 mg IV twice daily -Supplemental oxygen, wean off as necessary.  Bilateral chronic venous stasis edema Right lower extremity necrotic toe with foul-smelling - Suspicion for possible early start of infection.  Will consult wound care -Check ABI.  If poor circulation, will have to get vascular involved  Morbid obesity with BMI greater than 35 - Combination of obstructive sleep apnea/hypoventilation syndrome.  Not necessarily compliant with his CPAP.  Chronic pain with opioid dependence -Resume home meds.  With caution  Diabetes mellitus type 2, insulin-dependent Diabetic neuropathy - Increase 70/30 insulin to 13 units twice daily.  Will add pre-meal insulin -Insulin sliding scale Accu-Chek -Continue gabapentin  CKD stage IV - Baseline creatinine 1.8.  Today is close to baseline around 1.8.  Closely monitor.    DVT prophylaxis: Eliquis Code Status: Full code Family Communication: Spoke with patient's wife [name] over the phone Disposition Plan: Still requiring significant amount of oxygen.  Unsafe for discharge.  Ongoing evaluation for hypoxia  Consultants:   None  Procedures:   None  Antimicrobials:   None   Subjective: Patient is upset at first that he is not being discharged today but I explained to him that currently he is on 6 L nasal cannula which is more than 3 L that he uses at home.  He also has right lower extremity foot pain and appears to have draining ulcer.  Spoke with his wife over the phone and his family agreed to stay in the hospital.  Review of Systems Otherwise negative except as per HPI, including: General:  Denies fever, chills, night sweats or unintended weight loss. Resp: Denies cough, wheezing, shortness of breath. Cardiac: Denies chest pain, palpitations, orthopnea,  paroxysmal nocturnal dyspnea. GI: Denies abdominal pain, nausea, vomiting, diarrhea or constipation GU: Denies dysuria, frequency, hesitancy or incontinence MS: Denies muscle aches, joint pain or swelling Neuro: Denies headache, neurologic deficits (focal weakness, numbness, tingling), abnormal gait Psych: Denies anxiety, depression, SI/HI/AVH Skin: Right lower extremity drain ID: Denies sick contacts, exotic exposures, travel  Objective: Vitals:   07/18/19 0642 07/18/19 0755 07/18/19 0800 07/18/19 0827  BP: 114/63 (!) 117/59 (!) 123/57   Pulse: 77 70 78   Resp:  (!) 21 (!) 21   Temp:  98.3 F (36.8 C)    TempSrc:  Oral    SpO2:  96% 94% 95%  Weight:      Height:        Intake/Output Summary (Last 24 hours) at 07/18/2019 1130 Last data filed at 07/18/2019 0700 Gross per 24 hour  Intake 1200 ml  Output 1900 ml  Net -700 ml   Filed Weights   07/16/19 0518 07/17/19 0646 07/18/19 0400  Weight: 122.9 kg 124.1 kg 124.9 kg    Examination:  General exam: Appears calm and comfortable, 6 L nasal cannula Respiratory system: Bilateral expiratory wheezing Cardiovascular system: S1 & S2 heard, RRR. No JVD, murmurs, rubs, gallops or clicks. No pedal edema. Gastrointestinal system: Abdomen is nondistended, soft and nontender. No organomegaly or masses felt. Normal bowel sounds heard. Central nervous system: Alert and oriented. No focal neurological deficits. Extremities: Symmetric 5 x 5 power. Skin: Bilateral lower extremity dressing in place with right toe appears to be foul-smelling. Psychiatry: Judgement and insight appear normal. Mood & affect appropriate.     Data Reviewed:   CBC: Recent Labs  Lab 07/14/19 0237 07/15/19 0652 07/16/19 0233 07/17/19 0216 07/18/19 0247  WBC 5.7 5.7 5.9 6.0 4.7  HGB 8.4* 8.7* 8.2* 8.1* 8.3*  HCT 29.1* 29.5* 27.5* 27.4* 28.2*  MCV 86.4 86.0 86.5 86.2 86.0  PLT 324 324 305 299 956   Basic Metabolic Panel: Recent Labs  Lab 07/12/19 0301   07/14/19 0237 07/15/19 0652 07/16/19 0233 07/17/19 0216 07/18/19 0247  NA 140   < > 142 144 142 141 140  K 4.4   < > 3.9 3.8 3.6 3.7 3.9  CL 90*   < > 91* 94* 92* 91* 90*  CO2 39*   < > 39* 37* 39* 39* 40*  GLUCOSE 124*   < > 140* 118* 125* 129* 208*  BUN 70*   < > 60* 54* 48* 46* 47*  CREATININE 1.98*   < > 1.81* 1.67* 1.70* 1.77* 1.86*  CALCIUM 8.8*   < > 8.5* 8.7* 8.5* 8.5* 8.7*  MG 2.4  --   --   --   --   --   --    < > = values in this interval not displayed.   GFR: Estimated Creatinine Clearance: 49.7 mL/min (A) (by C-G formula based on SCr of 1.86 mg/dL (H)). Liver Function Tests: No results for input(s): AST, ALT, ALKPHOS, BILITOT, PROT, ALBUMIN in the last 168 hours. No results for input(s): LIPASE, AMYLASE in the last 168 hours. No results for input(s): AMMONIA in the last 168 hours. Coagulation Profile: No results for input(s): INR, PROTIME in the last 168 hours. Cardiac Enzymes: No results for input(s): CKTOTAL, CKMB, CKMBINDEX, TROPONINI in the last 168 hours. BNP (last 3 results) No results for input(s): PROBNP in the last  8760 hours. HbA1C: No results for input(s): HGBA1C in the last 72 hours. CBG: Recent Labs  Lab 07/17/19 1057 07/17/19 1540 07/17/19 2143 07/18/19 0621 07/18/19 1048  GLUCAP 164* 191* 240* 235* 303*   Lipid Profile: No results for input(s): CHOL, HDL, LDLCALC, TRIG, CHOLHDL, LDLDIRECT in the last 72 hours. Thyroid Function Tests: No results for input(s): TSH, T4TOTAL, FREET4, T3FREE, THYROIDAB in the last 72 hours. Anemia Panel: No results for input(s): VITAMINB12, FOLATE, FERRITIN, TIBC, IRON, RETICCTPCT in the last 72 hours. Sepsis Labs: No results for input(s): PROCALCITON, LATICACIDVEN in the last 168 hours.  No results found for this or any previous visit (from the past 240 hour(s)).       Radiology Studies: No results found.      Scheduled Meds: . apixaban  5 mg Oral BID  . aspirin  81 mg Oral Daily  .  carvedilol  3.125 mg Oral BID WC  . chlorhexidine  15 mL Mouth Rinse BID  . ferrous sulfate  325 mg Oral BID  . furosemide  80 mg Intravenous BID  . gabapentin  300 mg Oral BID  . guaiFENesin  600 mg Oral BID  . insulin aspart  5 Units Subcutaneous TID WC  . insulin aspart protamine- aspart  13 Units Subcutaneous BID WC  . ipratropium  0.5 mg Nebulization Q6H  . levalbuterol  0.63 mg Nebulization Q6H  . mouth rinse  15 mL Mouth Rinse q12n4p  . methylPREDNISolone (SOLU-MEDROL) injection  40 mg Intravenous Q12H  . mometasone-formoterol  2 puff Inhalation BID  . naLOXone (NARCAN)  injection  0.4 mg Intravenous Once  . oxybutynin  5 mg Oral QID  . pantoprazole  40 mg Oral BID  . pneumococcal 23 valent vaccine  0.5 mL Intramuscular Tomorrow-1000  . polyethylene glycol  17 g Oral BID  . rosuvastatin  20 mg Oral q1800  . sodium chloride flush  3 mL Intravenous Q12H  . tamsulosin  0.4 mg Oral QPC supper   Continuous Infusions: . sodium chloride       LOS: 10 days   Time spent= 35 mins    Ankit Arsenio Loader, MD [facility]  If 7PM-7AM, please contact night-coverage [email or web address] 07/18/2019, 11:30 AM

## 2019-07-18 NOTE — Progress Notes (Signed)
NCM spoke with patient, he states to call his wife, NCM contacted wife and asked about outpatient palliative services, she states they do not want this at this time.

## 2019-07-18 NOTE — Consult Note (Signed)
WOC consulted for LE wounds, reviewed the patient's record and the most recent Unna's boot change was per the orthopedic tech on 07/10/19. Weekly change is best practice. Will update chart with orders for the ortho tech to change the Unna's boots today. Requested the bedside nurse to remove, she has reported that he needs an ultrasound which will require the compression wraps to be removed.    After test performed, bedside nurse to page ortho tech to re-apply Unna's boot.  Discussed POC with patient and bedside nurse.  Re consult if needed, will not follow at this time. Thanks  Trevar Boehringer [employer], RN,CWOCN, CNS, [place] [phone])

## 2019-07-18 NOTE — Progress Notes (Signed)
PT Cancellation Note  Patient Details Name: Nicholas Caldwell MRN: [id number] DOB: 07-06-1946   Cancelled Treatment:    Reason Eval/Treat Not Completed: Patient declined, no reason specified; patient mad at me for asking about walking due to his toe being uncovered and unna boots off due to awaiting LE ultrasound.  Asked me to leave and I asked if he wants to decline PT altogether due to already "firing" another PT, but stated he would like [place] to check back tomorrow when legs are wrapped.  Will attempt again another day.    Reginia Naas 07/18/2019, 1:43 PM  Magda Kiel, [place] [phone] 07/18/2019

## 2019-07-19 ENCOUNTER — Inpatient Hospital Stay (HOSPITAL_COMMUNITY): Payer: No Typology Code available for payment source

## 2019-07-19 DIAGNOSIS — L0390 Cellulitis, unspecified: Secondary | ICD-10-CM

## 2019-07-19 DIAGNOSIS — I96 Gangrene, not elsewhere classified: Secondary | ICD-10-CM

## 2019-07-19 LAB — BASIC METABOLIC PANEL
Anion gap: 12 (ref 5–15)
BUN: 47 mg/dL — ABNORMAL HIGH (ref 8–23)
CO2: 38 mmol/L — ABNORMAL HIGH (ref 22–32)
Calcium: 8.6 mg/dL — ABNORMAL LOW (ref 8.9–10.3)
Chloride: 90 mmol/L — ABNORMAL LOW (ref 98–111)
Creatinine, Ser: 1.61 mg/dL — ABNORMAL HIGH (ref 0.61–1.24)
GFR calc Af Amer: 49 mL/min — ABNORMAL LOW (ref >60)
GFR calc non Af Amer: 42 mL/min — ABNORMAL LOW (ref >60)
Glucose, Bld: 205 mg/dL — ABNORMAL HIGH (ref 70–99)
Potassium: 3.8 mmol/L (ref 3.5–5.1)
Sodium: 140 mmol/L (ref 135–145)

## 2019-07-19 LAB — CBC
HCT: 26.8 % — ABNORMAL LOW (ref 39.0–52.0)
Hemoglobin: 8.3 g/dL — ABNORMAL LOW (ref 13.0–17.0)
MCH: 26.2 pg (ref 26.0–34.0)
MCHC: 31 g/dL (ref 30.0–36.0)
MCV: 84.5 fL (ref 80.0–100.0)
Platelets: 316 10*3/uL (ref 150–400)
RBC: 3.17 MIL/uL — ABNORMAL LOW (ref 4.22–5.81)
RDW: 17.6 % — ABNORMAL HIGH (ref 11.5–15.5)
WBC: 7.5 10*3/uL (ref 4.0–10.5)
nRBC: 0 % (ref 0.0–0.2)

## 2019-07-19 LAB — MAGNESIUM: Magnesium: 2.9 mg/dL — ABNORMAL HIGH (ref 1.7–2.4)

## 2019-07-19 LAB — GLUCOSE, CAPILLARY
Glucose-Capillary: 216 mg/dL — ABNORMAL HIGH (ref 70–99)
Glucose-Capillary: 203 mg/dL — ABNORMAL HIGH (ref 70–99)
Glucose-Capillary: 220 mg/dL — ABNORMAL HIGH (ref 70–99)
Glucose-Capillary: 154 mg/dL — ABNORMAL HIGH (ref 70–99)

## 2019-07-19 MED ORDER — INSULIN ASPART 100 UNIT/ML ~~LOC~~ SOLN
8.0000 [IU] | Freq: Three times a day (TID) | SUBCUTANEOUS | Status: DC
  Administered 2019-07-19 – 2019-07-22 (×10): 8 [IU] via SUBCUTANEOUS

## 2019-07-19 MED ORDER — DOXYCYCLINE HYCLATE 100 MG PO TABS
100.0000 mg | ORAL_TABLET | Freq: Two times a day (BID) | ORAL | Status: DC
  Administered 2019-07-19 – 2019-07-22 (×7): 100 mg via ORAL
  Filled 2019-07-19 (×7): qty 1

## 2019-07-19 MED ORDER — INSULIN ASPART PROT & ASPART (70-30 MIX) 100 UNIT/ML ~~LOC~~ SUSP
17.0000 [IU] | Freq: Two times a day (BID) | SUBCUTANEOUS | Status: DC
  Administered 2019-07-19 – 2019-07-22 (×6): 17 [IU] via SUBCUTANEOUS

## 2019-07-19 NOTE — Progress Notes (Signed)
ABI       has been completed. Preliminary results can be found under CV proc through chart review. June Leap, BS, RDMS, RVT

## 2019-07-19 NOTE — Plan of Care (Signed)
  Problem: Activity: Goal: Capacity to carry out activities will improve Outcome: Progressing   Problem: Health Behavior/Discharge Planning: Goal: Ability to manage health-related needs will improve Outcome: Progressing   Problem: Activity: Goal: Risk for activity intolerance will decrease Outcome: Progressing

## 2019-07-19 NOTE — Progress Notes (Signed)
Physical Therapy Treatment Patient Details Name: Nicholas Caldwell MRN: [id number] DOB: Apr 07, 1946 Today's Date: 07/19/2019    History of Present Illness 73 yo admitted with SOB with CHF exacerbation. PMHx: Afib, CAD, CHF, COPD on home O2, DM, HTN, HLD    PT Comments    Pt admitted with above diagnosis. Pt was able to ambulate with RW with min guard assist with good saffety.  Do feel that pt can go home with wife and she should be able to manage him.  Will continue to follow acutely.   Pt currently with functional limitations due to balance and endurance deficits. Pt will benefit from skilled PT to increase their independence and safety with mobility to allow discharge to the venue listed below.     Follow Up Recommendations  Supervision/Assistance - 24 hour;Home health PT     Equipment Recommendations  None recommended by PT    Recommendations for Other Services OT consult     Precautions / Restrictions Precautions Precautions: Fall Precaution Comments: watch sats Restrictions Weight Bearing Restrictions: No    Mobility  Bed Mobility Overal bed mobility: Needs Assistance Bed Mobility: Supine to Sit;Sit to Supine     Supine to sit: Min guard;Min assist Sit to supine: Min assist   General bed mobility comments: assist for elevation of trunk OOB and assist with LEs to get back into bed.   Transfers Overall transfer level: Needs assistance Equipment used: Rolling walker (2 wheeled) Transfers: Sit to/from Stand Sit to Stand: Min guard;From elevated surface(requested bed raised)         General transfer comment: min assist to steady with multimodal cues. also cues for hand placement as pt wanted to pull up on RW  Ambulation/Gait Ambulation/Gait assistance: Min guard Gait Distance (Feet): 120 Feet Assistive device: Rolling walker (2 wheeled) Gait Pattern/deviations: Step-to pattern;Decreased step length - right;Decreased step length - left;Decreased stride length;Wide base  of support;Trunk flexed   Gait velocity interpretation: <1.31 ft/sec, indicative of household ambulator General Gait Details: Pt flexes forward and widens BOS.  Pt took a few standing rest breaks.  Needed no assist for steadying today and was able to judge distance and walk to hall and back to bed.    Stairs             Wheelchair Mobility    Modified Rankin (Stroke Patients Only)       Balance Overall balance assessment: Needs assistance Sitting-balance support: No upper extremity supported;Feet supported Sitting balance-Leahy Scale: Fair     Standing balance support: Bilateral upper extremity supported Standing balance-Leahy Scale: Poor Standing balance comment: Relies on UE support for standing, and external support.                             Cognition Arousal/Alertness: Awake/alert Behavior During Therapy: Flat affect Overall Cognitive Status: Impaired/Different from baseline Area of Impairment: Orientation;Attention;Memory;Following commands;Safety/judgement;Problem solving                 Orientation Level: Disoriented to;Time;Situation Current Attention Level: Focused Memory: Decreased short-term memory Following Commands: Follows one step commands inconsistently;Follows one step commands with increased time Safety/Judgement: Decreased awareness of safety;Decreased awareness of deficits   Problem Solving: Slow processing;Requires verbal cues;Requires tactile cues General Comments: Nurse had to talk pt into working with PT      Exercises General Exercises - Lower Extremity Long Arc Quad: AROM;Both;Seated;10 reps Hip Flexion/Marching: AROM;Both;Seated;10 reps    General Comments General comments (skin  integrity, edema, etc.): Pt on 6LO2 to maintain sats >88%.      Pertinent Vitals/Pain Pain Assessment: No/denies pain    Home Living                      Prior Function            PT Goals (current goals can now be  found in the care plan section) Acute Rehab PT Goals Patient Stated Goal: return home and watch tv Progress towards PT goals: Progressing toward goals    Frequency    Min 3X/week      PT Plan Discharge plan needs to be updated    Co-evaluation              AM-PAC PT "6 Clicks" Mobility   Outcome Measure  Help needed turning from your back to your side while in a flat bed without using bedrails?: A Little Help needed moving from lying on your back to sitting on the side of a flat bed without using bedrails?: A Little Help needed moving to and from a bed to a chair (including a wheelchair)?: A Little Help needed standing up from a chair using your arms (e.g., wheelchair or bedside chair)?: A Little Help needed to walk in hospital room?: A Little Help needed climbing 3-5 steps with a railing? : A Lot 6 Click Score: 17    End of Session Equipment Utilized During Treatment: Oxygen;Gait belt Activity Tolerance: Patient limited by fatigue(self limiting) Patient left: with call bell/phone within reach;in bed Nurse Communication: Mobility status PT Visit Diagnosis: Muscle weakness (generalized) (M62.81);Other abnormalities of gait and mobility (R26.89);History of falling (Z91.81)     Time: 9499-7182 PT Time Calculation (min) (ACUTE ONLY): 23 min  Charges:  $Gait Training: 8-22 mins $Therapeutic Exercise: 8-22 mins                     [place] Pager:  [phone]  Office:  [place] 07/19/2019, 12:20 PM

## 2019-07-19 NOTE — Progress Notes (Signed)
Orthopedic Tech Progress Note Patient Details:  Nicholas Caldwell 01/07/1946 [id number]  Ortho Devices Type of Ortho Device: Haematologist Ortho Device/Splint Location: bi-lateral Ortho Device/Splint Interventions: Ordered, Application, Adjustment   Post Interventions Patient Tolerated: Well Instructions Provided: Care of device, Adjustment of device   Karolee Stamps 07/19/2019, 8:35 PM

## 2019-07-19 NOTE — Progress Notes (Signed)
PROGRESS NOTE    Nicholas Caldwell  KGY:[id number] DOB: 1946-08-24 DOA: 07/07/2019 PCP: [facility]   Brief Narrative:  73 y.o.male who is wheelchair boundw hypertension, hyperlipidemia, Paroxysmal afib, CAD, CHF(EF 40-45%), Dm2, w neuropathy,[place] home o2 3L [place], OSA, w recent admission 06/08/2019 for acute on chronic respiratory failure secondary to CHF presents with dyspnea for the past 3 week, as well as weight gain of about 15lbs per pt. His breathing has been worse for the past 3 days. Pulse ox was 74% on room air. CXR >Cardiomegaly with interstitial edema and layering effusions. Pro BNP. 2,211.9 Troponin 37. He was last admitted from 8/5-8/10 for CHF exacerbation. He developed acute respiratory failure on 9/5 and required BiPAP x24 hours.He has been declining CPAP at night in spite of extensive discussions by Hospitalist team,dizzy at times after oxycodone use.  During the hospitalization patient was aggressively been diuresed and also received steroids with bronchodilators for COPD exacerbation.   Assessment & Plan:   Principal Problem:   Acute on chronic combined systolic and diastolic CHF (congestive heart failure) (HCC) Active Problems:   Essential hypertension   Type 2 diabetes mellitus with neuropathy   Elevated troponin I level   PAF (paroxysmal atrial fibrillation) (HCC)   Acute CHF (congestive heart failure) ([place])   Palliative care by specialist   DNR (do not resuscitate) discussion   Fatigue  Acute on chronic hypoxic respiratory failure, baseline 3 L nasal cannula.  Currently 5 L nasal cannula -Multifactorial with COPD exacerbation and fluid overload.  Still on 5 L nasal cannula. -Each issues discussed below. -Check chest x-ray  Acute congestive heart failure with preserved ejection fraction, RV failure, ejection fraction 65% - Continue 80 mg of IV Lasix twice daily.  Monitor urine output. -Closely monitor renal function.  Continue Coreg and aspirin  -BiDil on hold.  Will resume when appropriate  Acute on chronic exacerbation of COPD - Xopenex and ipratropium scheduled. -Continue Dulera -Continue Solu-Medrol 40 mg IV twice daily -Supplemental oxygen, wean off as necessary.  Bilateral chronic venous stasis edema Right lower extremity necrotic toe with foul-smelling - Surprisingly his ABIs are normal (PreLim).  Add doxycycline.  [facility]  Morbid obesity with BMI greater than 35 - Combination of obstructive sleep apnea/hypoventilation syndrome.  Not necessarily compliant with his CPAP.  Chronic pain with opioid dependence -Resume home meds.  With caution  Diabetes mellitus type 2, insulin-dependent Diabetic neuropathy - Increase 70/30 insulin to 17 units twice daily.  Increase pre-meal insulin to 8 units -Insulin sliding scale Accu-Chek -Continue gabapentin  CKD stage IV - Baseline creatinine 1.8.  Today is close to baseline around 1.6  Closely monitor.    DVT prophylaxis: Eliquis Code Status: Full code Family Communication: NOne Disposition Plan: Still having bilateral expiratory wheezing and requiring 5 L nasal cannula.  Not stable for discharge  Consultants:   None  Procedures:   None  Antimicrobials:   Doxycycline D1   Subjective: Still exertional SOB on 5L .   Review of Systems Otherwise negative except as per HPI, including: General = no fevers, chills, dizziness, malaise, fatigue HEENT/EYES = negative for pain, redness, loss of vision, double vision, blurred vision, loss of hearing, sore throat, hoarseness, dysphagia Cardiovascular= negative for chest pain, palpitation, murmurs, lower extremity swelling Respiratory/lungs= negative for shortness of breath, cough, hemoptysis, wheezing, mucus production Gastrointestinal= negative for nausea, vomiting,, abdominal pain, melena, hematemesis Genitourinary= negative for Dysuria, Hematuria, Change in Urinary Frequency  MSK = Negative  for arthralgia, myalgias, Back Pain, Joint swelling  Neurology= Negative for headache, seizures, numbness, tingling  Psychiatry= Negative for anxiety, depression, suicidal and homocidal ideation Allergy/Immunology= Medication/Food allergy as listed  Skin= Negative for Rash, lesions, ulcers, itching  Objective: Vitals:   07/19/19 0459 07/19/19 0625 07/19/19 0744 07/19/19 0754  BP:  133/61 (!) 111/57 (!) 111/57  Pulse:  75 75   Resp:   (!) 23 18  Temp:   98.3 F (36.8 C)   TempSrc:   Oral   SpO2: 93%  91%   Weight: 123.3 kg     Height:        Intake/Output Summary (Last 24 hours) at 07/19/2019 1014 Last data filed at 07/19/2019 0952 Gross per 24 hour  Intake 1313 ml  Output 2350 ml  Net -1037 ml   Filed Weights   07/17/19 0646 07/18/19 0400 07/19/19 0459  Weight: 124.1 kg 124.9 kg 123.3 kg    Examination  Constitutional: NAD, calm, comfortable, father nasal cannula Eyes: PERRL, lids and conjunctivae normal ENMT: Mucous membranes are moist. Posterior pharynx clear of any exudate or lesions.Normal dentition.  Neck: normal, supple, no masses, no thyromegaly Respiratory: Bilateral expiratory wheezing Cardiovascular: Regular rate and rhythm, no murmurs / rubs / gallops. No extremity edema. 2+ pedal pulses. No carotid bruits.  Abdomen: no tenderness, no masses palpated. No hepatosplenomegaly. Bowel sounds positive.  Musculoskeletal: no clubbing / cyanosis. No joint deformity upper and lower extremities. Good ROM, no contractures. Normal muscle tone.  Skin: Right toe appears dark foul-smelling drainage Neurologic: CN 2-12 grossly intact. Sensation intact, DTR normal. Strength 4/5 in all 4.  Psychiatric: Normal judgment and insight. Alert and oriented x 3. Normal mood.    Data Reviewed:   CBC: Recent Labs  Lab 07/15/19 0652 07/16/19 0233 07/17/19 0216 07/18/19 0247 07/19/19 0230  WBC 5.7 5.9 6.0 4.7 7.5  HGB 8.7* 8.2* 8.1* 8.3* 8.3*  HCT 29.5* 27.5*  27.4* 28.2* 26.8*  MCV 86.0 86.5 86.2 86.0 84.5  PLT 324 305 299 321 884   Basic Metabolic Panel: Recent Labs  Lab 07/15/19 0652 07/16/19 0233 07/17/19 0216 07/18/19 0247 07/19/19 0230  NA 144 142 141 140 140  K 3.8 3.6 3.7 3.9 3.8  CL 94* 92* 91* 90* 90*  CO2 37* 39* 39* 40* 38*  GLUCOSE 118* 125* 129* 208* 205*  BUN 54* 48* 46* 47* 47*  CREATININE 1.67* 1.70* 1.77* 1.86* 1.61*  CALCIUM 8.7* 8.5* 8.5* 8.7* 8.6*  MG  --   --   --   --  2.9*   GFR: Estimated Creatinine Clearance: 57.1 mL/min (A) (by C-G formula based on SCr of 1.61 mg/dL (H)). Liver Function Tests: No results for input(s): AST, ALT, ALKPHOS, BILITOT, PROT, ALBUMIN in the last 168 hours. No results for input(s): LIPASE, AMYLASE in the last 168 hours. No results for input(s): AMMONIA in the last 168 hours. Coagulation Profile: No results for input(s): INR, PROTIME in the last 168 hours. Cardiac Enzymes: No results for input(s): CKTOTAL, CKMB, CKMBINDEX, TROPONINI in the last 168 hours. BNP (last 3 results) No results for input(s): PROBNP in the last 8760 hours. HbA1C: No results for input(s): HGBA1C in the last 72 hours. CBG: Recent Labs  Lab 07/18/19 0621 07/18/19 1048 07/18/19 1637 07/18/19 2140 07/19/19 0617  GLUCAP 235* 303* 278* 330* 216*   Lipid Profile: No results for input(s): CHOL, HDL, LDLCALC, TRIG, CHOLHDL, LDLDIRECT in the last 72 hours. Thyroid Function Tests: No results for input(s): TSH, T4TOTAL, FREET4,  T3FREE, THYROIDAB in the last 72 hours. Anemia Panel: No results for input(s): VITAMINB12, FOLATE, FERRITIN, TIBC, IRON, RETICCTPCT in the last 72 hours. Sepsis Labs: Recent Labs  Lab 07/18/19 1227  PROCALCITON 0.15    No results found for this or any previous visit (from the past 240 hour(s)).       Radiology Studies: Dg Chest Port 1 View  Result Date: 07/18/2019 CLINICAL DATA:  Dyspnea EXAM: PORTABLE CHEST 1 VIEW COMPARISON:  Chest radiograph dated 07/07/2019  FINDINGS: The heart size remains enlarged. There are small bilateral pleural effusions with associated atelectasis/airspace disease, increased on the right and unchanged on the left compared to 07/07/2019. There is mild pulmonary vascular congestion. No pneumothorax is identified the visualized skeletal structures are unremarkable. IMPRESSION: Increased right and unchanged left pleural effusions with associated atelectasis/airspace disease. Mild pulmonary vascular congestion. Electronically Signed   By: Zerita Boers M.D.   On: 07/18/2019 13:16   Vas [place] Burnard Bunting With/wo Tbi  Result Date: 07/19/2019 LOWER EXTREMITY DOPPLER STUDY Indications: Ulceration, and gangrene. High Risk Factors: Diabetes, current smoker.  Performing Technologist: June Leap RDMS, RVT  Examination Guidelines: A complete evaluation includes at minimum, Doppler waveform signals and systolic blood pressure reading at the level of bilateral brachial, anterior tibial, and posterior tibial arteries, when vessel segments are accessible. Bilateral testing is considered an integral part of a complete examination. Photoelectric Plethysmograph (PPG) waveforms and toe systolic pressure readings are included as required and additional duplex testing as needed. Limited examinations for reoccurring indications may be performed as noted.  ABI Findings: +--------+------------------+-----+---------+--------+ Right   Rt Pressure (mmHg)IndexWaveform Comment  +--------+------------------+-----+---------+--------+ [id number]                    triphasic         +--------+------------------+-----+---------+--------+ ATA     138               1.08 triphasic         +--------+------------------+-----+---------+--------+ PTA     129               1.01 triphasic         +--------+------------------+-----+---------+--------+ +--------+------------------+-----+---------+-------+ Left    Lt Pressure (mmHg)IndexWaveform Comment  +--------+------------------+-----+---------+-------+ [id number]                    triphasic        +--------+------------------+-----+---------+-------+ ATA     164               1.28 triphasic        +--------+------------------+-----+---------+-------+ PTA     144               1.12 triphasic        +--------+------------------+-----+---------+-------+  Summary: Right: Resting right ankle-brachial index is within normal range. No evidence of significant right lower extremity arterial disease. Left: Resting left ankle-brachial index is within normal range. No evidence of significant left lower extremity arterial disease.  *See table(s) above for measurements and observations.    Preliminary         Scheduled Meds: . apixaban  5 mg Oral BID  . aspirin  81 mg Oral Daily  . carvedilol  3.125 mg Oral BID WC  . chlorhexidine  15 mL Mouth Rinse BID  . ferrous sulfate  325 mg Oral BID  . furosemide  80 mg Intravenous BID  . gabapentin  300 mg Oral BID  . guaiFENesin  600 mg Oral BID  . insulin aspart  5 Units Subcutaneous TID WC  . insulin aspart protamine- aspart  13 Units Subcutaneous BID WC  . ipratropium  0.5 mg Nebulization Q6H  . levalbuterol  0.63 mg Nebulization Q6H  . mouth rinse  15 mL Mouth Rinse q12n4p  . methylPREDNISolone (SOLU-MEDROL) injection  40 mg Intravenous Q12H  . mometasone-formoterol  2 puff Inhalation BID  . naLOXone (NARCAN)  injection  0.4 mg Intravenous Once  . oxybutynin  5 mg Oral QID  . pantoprazole  40 mg Oral BID  . pneumococcal 23 valent vaccine  0.5 mL Intramuscular Tomorrow-1000  . polyethylene glycol  17 g Oral BID  . rosuvastatin  20 mg Oral q1800  . sodium chloride flush  3 mL Intravenous Q12H  . tamsulosin  0.4 mg Oral QPC supper   Continuous Infusions: . sodium chloride       LOS: 11 days   Time spent= 25 mins    Ankit Arsenio Loader, MD [facility]  If 7PM-7AM, please contact night-coverage [email or web address]  07/19/2019, 10:14 AM

## 2019-07-19 NOTE — Discharge Instructions (Signed)
Information on my medicine - ELIQUIS (apixaban)  Why was Eliquis prescribed for you? Eliquis was prescribed for you to reduce the risk of a blood clot forming that can cause a stroke if you have a medical condition called atrial fibrillation (a type of irregular heartbeat).  What do You need to know about Eliquis ? Take your Eliquis TWICE DAILY - one tablet in the morning and one tablet in the evening with or without food. If you have difficulty swallowing the tablet whole please discuss with your pharmacist how to take the medication safely.  Take Eliquis exactly as prescribed by your doctor and DO NOT stop taking Eliquis without talking to the doctor who prescribed the medication.  Stopping may increase your risk of developing a stroke.  Refill your prescription before you run out.  After discharge, you should have regular check-up appointments with your healthcare provider that is prescribing your Eliquis.  In the future your dose may need to be changed if your kidney function or weight changes by a significant amount or as you get older.  What do you do if you miss a dose? If you miss a dose, take it as soon as you remember on the same day and resume taking twice daily.  Do not take more than one dose of ELIQUIS at the same time to make up a missed dose.  Important Safety Information A possible side effect of Eliquis is bleeding. You should call your healthcare provider right away if you experience any of the following: ? Bleeding from an injury or your nose that does not stop. ? Unusual colored urine (red or dark brown) or unusual colored stools (red or black). ? Unusual bruising for unknown reasons. ? A serious fall or if you hit your head (even if there is no bleeding).  Some medicines may interact with Eliquis and might increase your risk of bleeding or clotting while on Eliquis. To help avoid this, consult your healthcare provider or pharmacist prior to using any new  prescription or non-prescription medications, including herbals, vitamins, non-steroidal anti-inflammatory drugs (NSAIDs) and supplements.  This website has more information on Eliquis (apixaban): http://[email or web address].com/eliquis/home

## 2019-07-19 NOTE — Progress Notes (Signed)
Inpatient Diabetes Program Recommendations  AACE/ADA: New Consensus Statement on Inpatient Glycemic Control (2015)  Target Ranges:  Prepandial:   less than 140 mg/dL      Peak postprandial:   less than 180 mg/dL (1-2 hours)      Critically ill patients:  140 - 180 mg/dL   Results for COULSON, WEHNER (MRN [id number]) as of 07/19/2019 10:17  Ref. Range 07/18/2019 06:21 07/18/2019 10:48 07/18/2019 16:37 07/18/2019 21:40 07/19/2019 06:17  Glucose-Capillary Latest Ref Range: 70 - 99 mg/dL 235 (H) 303 (H) 278 (H) 330 (H) 216 (H)   Review of Glycemic Control  Diabetes history: DM2 Outpatient Diabetes medications: 70/30 10 units BID, Novolog 4 units TID with meals Current orders for Inpatient glycemic control: 70/30 13 units BID, Novolog 5 units TID with meals; Solumedrol 40 mg Q12H  Inpatient Diabetes Program Recommendations:   Insulin-Basal: If steroids are continued, please consider increasing 70/30 to 15 units BID.  Insulin-Meal Coverage: Please consider changing meal coverage to Novolog 5 units with lunch since patient is ordered 70/30 insulin it is intended to provide meal coverage (with 30% of regular portion of mixed insulin) for breakfast and supper.    Insulin-Correction: Please consider ordering Novolog 0-9 units TID with meals and Novolog 0-5 units QHS.  Thanks, Barnie Alderman, RN, MSN, CDE Diabetes Coordinator Inpatient Diabetes Program [phone] (Team Pager from 8am to 5pm)

## 2019-07-20 LAB — BASIC METABOLIC PANEL
Anion gap: 12 (ref 5–15)
BUN: 44 mg/dL — ABNORMAL HIGH (ref 8–23)
CO2: 38 mmol/L — ABNORMAL HIGH (ref 22–32)
Calcium: 8.6 mg/dL — ABNORMAL LOW (ref 8.9–10.3)
Chloride: 91 mmol/L — ABNORMAL LOW (ref 98–111)
Creatinine, Ser: 1.5 mg/dL — ABNORMAL HIGH (ref 0.61–1.24)
GFR calc Af Amer: 54 mL/min — ABNORMAL LOW (ref >60)
GFR calc non Af Amer: 46 mL/min — ABNORMAL LOW (ref >60)
Glucose, Bld: 168 mg/dL — ABNORMAL HIGH (ref 70–99)
Potassium: 3.7 mmol/L (ref 3.5–5.1)
Sodium: 141 mmol/L (ref 135–145)

## 2019-07-20 LAB — GLUCOSE, CAPILLARY
Glucose-Capillary: 190 mg/dL — ABNORMAL HIGH (ref 70–99)
Glucose-Capillary: 200 mg/dL — ABNORMAL HIGH (ref 70–99)
Glucose-Capillary: 227 mg/dL — ABNORMAL HIGH (ref 70–99)
Glucose-Capillary: 241 mg/dL — ABNORMAL HIGH (ref 70–99)

## 2019-07-20 LAB — CBC
HCT: 26.9 % — ABNORMAL LOW (ref 39.0–52.0)
Hemoglobin: 8.4 g/dL — ABNORMAL LOW (ref 13.0–17.0)
MCH: 26.6 pg (ref 26.0–34.0)
MCHC: 31.2 g/dL (ref 30.0–36.0)
MCV: 85.1 fL (ref 80.0–100.0)
Platelets: 328 10*3/uL (ref 150–400)
RBC: 3.16 MIL/uL — ABNORMAL LOW (ref 4.22–5.81)
RDW: 17.8 % — ABNORMAL HIGH (ref 11.5–15.5)
WBC: 9.7 10*3/uL (ref 4.0–10.5)
nRBC: 0 % (ref 0.0–0.2)

## 2019-07-20 LAB — MAGNESIUM: Magnesium: 2.9 mg/dL — ABNORMAL HIGH (ref 1.7–2.4)

## 2019-07-20 MED ORDER — FUROSEMIDE 10 MG/ML IJ SOLN
80.0000 mg | Freq: Three times a day (TID) | INTRAMUSCULAR | Status: DC
  Administered 2019-07-20: 80 mg via INTRAVENOUS
  Filled 2019-07-20: qty 8

## 2019-07-20 MED ORDER — METOLAZONE 5 MG PO TABS
5.0000 mg | ORAL_TABLET | Freq: Once | ORAL | Status: AC
  Administered 2019-07-20: 5 mg via ORAL
  Filled 2019-07-20: qty 1

## 2019-07-20 MED ORDER — PHENOL 1.4 % MT LIQD
1.0000 | OROMUCOSAL | Status: DC | PRN
  Administered 2019-07-20: 1 via OROMUCOSAL
  Filled 2019-07-20: qty 177

## 2019-07-20 MED ORDER — FUROSEMIDE 10 MG/ML IJ SOLN
80.0000 mg | Freq: Two times a day (BID) | INTRAMUSCULAR | Status: DC
  Administered 2019-07-20 – 2019-07-21 (×2): 80 mg via INTRAVENOUS
  Filled 2019-07-20 (×2): qty 8

## 2019-07-20 MED ORDER — POTASSIUM CHLORIDE CRYS ER 20 MEQ PO TBCR
40.0000 meq | EXTENDED_RELEASE_TABLET | Freq: Once | ORAL | Status: AC
  Administered 2019-07-20: 40 meq via ORAL
  Filled 2019-07-20: qty 2

## 2019-07-20 MED ORDER — IPRATROPIUM BROMIDE 0.02 % IN SOLN
0.5000 mg | Freq: Two times a day (BID) | RESPIRATORY_TRACT | Status: DC
  Administered 2019-07-20 – 2019-07-21 (×2): 0.5 mg via RESPIRATORY_TRACT
  Filled 2019-07-20 (×2): qty 2.5

## 2019-07-20 MED ORDER — LEVALBUTEROL HCL 0.63 MG/3ML IN NEBU
0.6300 mg | INHALATION_SOLUTION | Freq: Two times a day (BID) | RESPIRATORY_TRACT | Status: DC
  Administered 2019-07-20 – 2019-07-21 (×2): 0.63 mg via RESPIRATORY_TRACT
  Filled 2019-07-20 (×2): qty 3

## 2019-07-20 NOTE — Care Management Important Message (Signed)
Important Message  Patient Details  Name: Nicholas Caldwell MRN: [id number] Date of Birth: 03-15-1946   Medicare Important Message Given:  Yes     Memory Argue 07/20/2019, 3:21 PM

## 2019-07-20 NOTE — Progress Notes (Signed)
PROGRESS NOTE    Nicholas Caldwell  WUG:[id number] DOB: 06-10-1946 DOA: 07/07/2019 PCP: [facility]   Brief Narrative:  73 y.o.male who is wheelchair boundw hypertension, hyperlipidemia, Paroxysmal afib, CAD, CHF(EF 40-45%), Dm2, w neuropathy,[place] home o2 3L [place], OSA, w recent admission 06/08/2019 for acute on chronic respiratory failure secondary to CHF presents with dyspnea for the past 3 week, as well as weight gain of about 15lbs per pt. His breathing has been worse for the past 3 days. Pulse ox was 74% on room air. CXR >Cardiomegaly with interstitial edema and layering effusions. Pro BNP. 2,211.9 Troponin 37. He was last admitted from 8/5-8/10 for CHF exacerbation. He developed acute respiratory failure on 9/5 and required BiPAP x24 hours.He has been declining CPAP at night in spite of extensive discussions by Hospitalist team,dizzy at times after oxycodone use.  During the hospitalization patient was aggressively been diuresed and also received steroids with bronchodilators for COPD exacerbation.   Assessment & Plan:   Principal Problem:   Acute on chronic combined systolic and diastolic CHF (congestive heart failure) (HCC) Active Problems:   Essential hypertension   Type 2 diabetes mellitus with neuropathy   Elevated troponin I level   PAF (paroxysmal atrial fibrillation) (HCC)   Acute CHF (congestive heart failure) ([place])   Palliative care by specialist   DNR (do not resuscitate) discussion   Fatigue  Acute on chronic hypoxic respiratory failure, baseline 3 L nasal cannula.  Currently 5 L nasal cannula -Multifactorial with COPD exacerbation and fluid overload.  Slow to improve.  4 L nasal cannula today. -Each issues discussed below.  Acute congestive heart failure with preserved ejection fraction, RV failure, ejection fraction 65% - 80 mg IV Lasix twice daily, slightly poor response.  Will give 1 dose of Zaroxolyn today. -Closely monitor renal function.  Continue  Coreg and aspirin -BiDil on hold.  Will resume when appropriate  Acute on chronic exacerbation of COPD, persistent - Xopenex and ipratropium scheduled. -Continue Dulera -Continue Solu-Medrol 40 mg IV twice daily -Supplemental oxygen, wean off as necessary.  Bilateral chronic venous stasis edema Right lower extremity necrotic toe with foul-smelling - Surprisingly his ABIs are normal (PreLim).  Doxycycline day 2.  Follow-up outpatient with his podiatry at the [facility]  Morbid obesity with BMI greater than 35 - Combination of obstructive sleep apnea/hypoventilation syndrome.  Not necessarily compliant with his CPAP.  Chronic pain with opioid dependence -Resume home meds.  With caution  Diabetes mellitus type 2, insulin-dependent Diabetic neuropathy - Continue 70/30 insulin to 17 units twice daily.  Continue pre-meal insulin to 8 units -Insulin sliding scale Accu-Chek -Continue gabapentin  CKD stage IV - Baseline creatinine 1.8.  Today is close to baseline around 1.5  Closely monitor.    DVT prophylaxis: Eliquis Code Status: Full code Family Communication: NOne Disposition Plan: Still having diffuse expiratory wheezing but slightly improved from yesterday.  Would benefit from 1 more day of IV diuretics.  Consultants:   None  Procedures:   None  Antimicrobials:   Doxycycline D2   Subjective: Exertional dyspnea but improved.  With ambulation oxygen levels dropped.  At rest on 4-5 L nasal cannula.  Baseline is 3 L nasal cannula  Review of Systems Otherwise negative except as per HPI, including: General = no fevers, chills, dizziness, malaise, fatigue HEENT/EYES = negative for pain, redness, loss of vision, double vision, blurred vision, loss of hearing, sore throat, hoarseness, dysphagia Cardiovascular= negative for chest pain, palpitation, murmurs, lower extremity swelling Respiratory/lungs= negative for  hemoptysis, wheezing, mucus production Gastrointestinal= negative  for nausea, vomiting,, abdominal pain, melena, hematemesis Genitourinary= negative for Dysuria, Hematuria, Change in Urinary Frequency MSK = Negative for arthralgia, myalgias, Back Pain, Joint swelling  Neurology= Negative for headache, seizures, numbness, tingling  Psychiatry= Negative for anxiety, depression, suicidal and homocidal ideation Allergy/Immunology= Medication/Food allergy as listed  Skin= Negative for Rash, lesions, ulcers, itching   Objective: Vitals:   07/20/19 0343 07/20/19 0700 07/20/19 0755 07/20/19 0844  BP:   (!) 110/56   Pulse: 62  76   Resp: 19  15   Temp:  98.4 F (36.9 C)    TempSrc:  Oral    SpO2: 99%  95% 98%  Weight:      Height:        Intake/Output Summary (Last 24 hours) at 07/20/2019 1043 Last data filed at 07/20/2019 0803 Gross per 24 hour  Intake 970 ml  Output 1600 ml  Net -630 ml   Filed Weights   07/18/19 0400 07/19/19 0459 07/20/19 0304  Weight: 124.9 kg 123.3 kg 123.8 kg    Examination Constitutional: NAD, calm, comfortable, 4 L nasal cannula during my evaluation Eyes: PERRL, lids and conjunctivae normal ENMT: Mucous membranes are moist. Posterior pharynx clear of any exudate or lesions.Normal dentition.  Neck: normal, supple, no masses, no thyromegaly Respiratory: Bilateral expiratory wheezing especially at bases improved from before. Cardiovascular: Regular rate and rhythm, no murmurs / rubs / gallops. No extremity edema. 2+ pedal pulses. No carotid bruits.  Abdomen: no tenderness, no masses palpated. No hepatosplenomegaly. Bowel sounds positive.  Musculoskeletal: no clubbing / cyanosis. No joint deformity upper and lower extremities. Good ROM, no contractures. Normal muscle tone.  Skin: Right lower extremity to place with dark with foul smelling but improved. Neurologic: CN 2-12 grossly intact. Sensation intact, DTR normal. Strength 5/5 in all 4.  Psychiatric: Normal judgment and insight. Alert and oriented x 3. Normal mood.     Data Reviewed:   CBC: Recent Labs  Lab 07/16/19 0233 07/17/19 0216 07/18/19 0247 07/19/19 0230 07/20/19 0221  WBC 5.9 6.0 4.7 7.5 9.7  HGB 8.2* 8.1* 8.3* 8.3* 8.4*  HCT 27.5* 27.4* 28.2* 26.8* 26.9*  MCV 86.5 86.2 86.0 84.5 85.1  PLT 305 299 321 316 916   Basic Metabolic Panel: Recent Labs  Lab 07/16/19 0233 07/17/19 0216 07/18/19 0247 07/19/19 0230 07/20/19 0221  NA 142 141 140 140 141  K 3.6 3.7 3.9 3.8 3.7  CL 92* 91* 90* 90* 91*  CO2 39* 39* 40* 38* 38*  GLUCOSE 125* 129* 208* 205* 168*  BUN 48* 46* 47* 47* 44*  CREATININE 1.70* 1.77* 1.86* 1.61* 1.50*  CALCIUM 8.5* 8.5* 8.7* 8.6* 8.6*  MG  --   --   --  2.9* 2.9*   GFR: Estimated Creatinine Clearance: 61.4 mL/min (A) (by C-G formula based on SCr of 1.5 mg/dL (H)). Liver Function Tests: No results for input(s): AST, ALT, ALKPHOS, BILITOT, PROT, ALBUMIN in the last 168 hours. No results for input(s): LIPASE, AMYLASE in the last 168 hours. No results for input(s): AMMONIA in the last 168 hours. Coagulation Profile: No results for input(s): INR, PROTIME in the last 168 hours. Cardiac Enzymes: No results for input(s): CKTOTAL, CKMB, CKMBINDEX, TROPONINI in the last 168 hours. BNP (last 3 results) No results for input(s): PROBNP in the last 8760 hours. HbA1C: No results for input(s): HGBA1C in the last 72 hours. CBG: Recent Labs  Lab 07/19/19 0617 07/19/19 1112 07/19/19 1600 07/19/19 2115 07/20/19  0603  GLUCAP 216* 203* 220* 154* 190*   Lipid Profile: No results for input(s): CHOL, HDL, LDLCALC, TRIG, CHOLHDL, LDLDIRECT in the last 72 hours. Thyroid Function Tests: No results for input(s): TSH, T4TOTAL, FREET4, T3FREE, THYROIDAB in the last 72 hours. Anemia Panel: No results for input(s): VITAMINB12, FOLATE, FERRITIN, TIBC, IRON, RETICCTPCT in the last 72 hours. Sepsis Labs: Recent Labs  Lab 07/18/19 1227  PROCALCITON 0.15    No results found for this or any previous visit (from the past 240  hour(s)).       Radiology Studies: Dg Chest Port 1 View  Result Date: 07/18/2019 CLINICAL DATA:  Dyspnea EXAM: PORTABLE CHEST 1 VIEW COMPARISON:  Chest radiograph dated 07/07/2019 FINDINGS: The heart size remains enlarged. There are small bilateral pleural effusions with associated atelectasis/airspace disease, increased on the right and unchanged on the left compared to 07/07/2019. There is mild pulmonary vascular congestion. No pneumothorax is identified the visualized skeletal structures are unremarkable. IMPRESSION: Increased right and unchanged left pleural effusions with associated atelectasis/airspace disease. Mild pulmonary vascular congestion. Electronically Signed   By: Zerita Boers M.D.   On: 07/18/2019 13:16   Vas [place] Burnard Bunting With/wo Tbi  Result Date: 07/19/2019 LOWER EXTREMITY DOPPLER STUDY Indications: Ulceration, and gangrene. High Risk Factors: Diabetes, current smoker.  Performing Technologist: June Leap RDMS, RVT  Examination Guidelines: A complete evaluation includes at minimum, Doppler waveform signals and systolic blood pressure reading at the level of bilateral brachial, anterior tibial, and posterior tibial arteries, when vessel segments are accessible. Bilateral testing is considered an integral part of a complete examination. Photoelectric Plethysmograph (PPG) waveforms and toe systolic pressure readings are included as required and additional duplex testing as needed. Limited examinations for reoccurring indications may be performed as noted.  ABI Findings: +--------+------------------+-----+---------+--------+ Right   Rt Pressure (mmHg)IndexWaveform Comment  +--------+------------------+-----+---------+--------+ [id number]                    triphasic         +--------+------------------+-----+---------+--------+ ATA     138               1.08 triphasic         +--------+------------------+-----+---------+--------+ PTA     129               1.01 triphasic          +--------+------------------+-----+---------+--------+ +--------+------------------+-----+---------+-------+ Left    Lt Pressure (mmHg)IndexWaveform Comment +--------+------------------+-----+---------+-------+ [id number]                    triphasic        +--------+------------------+-----+---------+-------+ ATA     164               1.28 triphasic        +--------+------------------+-----+---------+-------+ PTA     144               1.12 triphasic        +--------+------------------+-----+---------+-------+  Summary: Right: Resting right ankle-brachial index is within normal range. No evidence of significant right lower extremity arterial disease. Left: Resting left ankle-brachial index is within normal range. No evidence of significant left lower extremity arterial disease.  *See table(s) above for measurements and observations.  Electronically signed by Deitra Mayo MD on 07/19/2019 at 2:56:20 PM.   Final         Scheduled Meds: . apixaban  5 mg Oral BID  . aspirin  81 mg Oral Daily  . carvedilol  3.125 mg Oral  BID WC  . chlorhexidine  15 mL Mouth Rinse BID  . doxycycline  100 mg Oral Q12H  . ferrous sulfate  325 mg Oral BID  . furosemide  80 mg Intravenous Q12H  . gabapentin  300 mg Oral BID  . guaiFENesin  600 mg Oral BID  . insulin aspart  8 Units Subcutaneous TID WC  . insulin aspart protamine- aspart  17 Units Subcutaneous BID WC  . ipratropium  0.5 mg Nebulization Q6H  . levalbuterol  0.63 mg Nebulization Q6H  . mouth rinse  15 mL Mouth Rinse q12n4p  . methylPREDNISolone (SOLU-MEDROL) injection  40 mg Intravenous Q12H  . metolazone  5 mg Oral Once  . mometasone-formoterol  2 puff Inhalation BID  . naLOXone (NARCAN)  injection  0.4 mg Intravenous Once  . oxybutynin  5 mg Oral QID  . pantoprazole  40 mg Oral BID  . pneumococcal 23 valent vaccine  0.5 mL Intramuscular Tomorrow-1000  . polyethylene glycol  17 g Oral BID  . rosuvastatin  20  mg Oral q1800  . sodium chloride flush  3 mL Intravenous Q12H  . tamsulosin  0.4 mg Oral QPC supper   Continuous Infusions: . sodium chloride       LOS: 12 days   Time spent= 35 mins    Nicholas Caldwell Arsenio Loader, MD [facility]  If 7PM-7AM, please contact night-coverage [email or web address] 07/20/2019, 10:43 AM

## 2019-07-21 LAB — CBC
HCT: 27.9 % — ABNORMAL LOW (ref 39.0–52.0)
Hemoglobin: 8.2 g/dL — ABNORMAL LOW (ref 13.0–17.0)
MCH: 25.3 pg — ABNORMAL LOW (ref 26.0–34.0)
MCHC: 29.4 g/dL — ABNORMAL LOW (ref 30.0–36.0)
MCV: 86.1 fL (ref 80.0–100.0)
Platelets: 341 10*3/uL (ref 150–400)
RBC: 3.24 MIL/uL — ABNORMAL LOW (ref 4.22–5.81)
RDW: 17.7 % — ABNORMAL HIGH (ref 11.5–15.5)
WBC: 10.3 10*3/uL (ref 4.0–10.5)
nRBC: 0 % (ref 0.0–0.2)

## 2019-07-21 LAB — BASIC METABOLIC PANEL
Anion gap: 10 (ref 5–15)
BUN: 41 mg/dL — ABNORMAL HIGH (ref 8–23)
CO2: 39 mmol/L — ABNORMAL HIGH (ref 22–32)
Calcium: 8.6 mg/dL — ABNORMAL LOW (ref 8.9–10.3)
Chloride: 90 mmol/L — ABNORMAL LOW (ref 98–111)
Creatinine, Ser: 1.56 mg/dL — ABNORMAL HIGH (ref 0.61–1.24)
GFR calc Af Amer: 51 mL/min — ABNORMAL LOW (ref >60)
GFR calc non Af Amer: 44 mL/min — ABNORMAL LOW (ref >60)
Glucose, Bld: 189 mg/dL — ABNORMAL HIGH (ref 70–99)
Potassium: 3.5 mmol/L (ref 3.5–5.1)
Sodium: 139 mmol/L (ref 135–145)

## 2019-07-21 LAB — GLUCOSE, CAPILLARY
Glucose-Capillary: 165 mg/dL — ABNORMAL HIGH (ref 70–99)
Glucose-Capillary: 122 mg/dL — ABNORMAL HIGH (ref 70–99)
Glucose-Capillary: 119 mg/dL — ABNORMAL HIGH (ref 70–99)
Glucose-Capillary: 283 mg/dL — ABNORMAL HIGH (ref 70–99)

## 2019-07-21 LAB — MAGNESIUM: Magnesium: 2.7 mg/dL — ABNORMAL HIGH (ref 1.7–2.4)

## 2019-07-21 MED ORDER — FUROSEMIDE 10 MG/ML IJ SOLN
80.0000 mg | Freq: Three times a day (TID) | INTRAMUSCULAR | Status: DC
  Administered 2019-07-21 – 2019-07-22 (×3): 80 mg via INTRAVENOUS
  Filled 2019-07-21 (×3): qty 8

## 2019-07-21 MED ORDER — METHYLPREDNISOLONE SODIUM SUCC 40 MG IJ SOLR
40.0000 mg | Freq: Three times a day (TID) | INTRAMUSCULAR | Status: DC
  Administered 2019-07-21 – 2019-07-22 (×2): 40 mg via INTRAVENOUS
  Filled 2019-07-21: qty 1

## 2019-07-21 MED ORDER — IPRATROPIUM BROMIDE 0.02 % IN SOLN
0.5000 mg | Freq: Three times a day (TID) | RESPIRATORY_TRACT | Status: DC
  Administered 2019-07-21 – 2019-07-22 (×3): 0.5 mg via RESPIRATORY_TRACT
  Filled 2019-07-21 (×3): qty 2.5

## 2019-07-21 MED ORDER — IPRATROPIUM BROMIDE 0.02 % IN SOLN: 0.5000 mg | Freq: Four times a day (QID) | RESPIRATORY_TRACT | Status: DC

## 2019-07-21 MED ORDER — LEVALBUTEROL HCL 0.63 MG/3ML IN NEBU
0.6300 mg | INHALATION_SOLUTION | Freq: Four times a day (QID) | RESPIRATORY_TRACT | Status: DC
  Administered 2019-07-21 – 2019-07-22 (×4): 0.63 mg via RESPIRATORY_TRACT
  Filled 2019-07-21 (×4): qty 3

## 2019-07-21 MED ORDER — POTASSIUM CHLORIDE CRYS ER 20 MEQ PO TBCR
40.0000 meq | EXTENDED_RELEASE_TABLET | Freq: Once | ORAL | Status: AC
  Administered 2019-07-21: 40 meq via ORAL
  Filled 2019-07-21: qty 2

## 2019-07-21 NOTE — Progress Notes (Addendum)
PT Cancellation Note  Patient Details Name: Nicholas Caldwell MRN: [id number] DOB: 08/11/1946   Cancelled Treatment:    Reason Eval/Treat Not Completed: (P) Patient declined, no reason specified(Pt reports he wished to rest and eat lunch, he reports to come back between 4-5pm, will f/u as time permits.)   Cristela Blue 07/21/2019, 1:56 PM  Nicholas Caldwell, PTA [place] Pager [phone] Office [phone]

## 2019-07-21 NOTE — Progress Notes (Signed)
Physical Therapy Treatment Patient Details Name: Nicholas Caldwell MRN: [id number] DOB: [birth year] Today's Date: 07/21/2019    History of Present Illness 73 yo admitted with SOB with CHF exacerbation. PMHx: Afib, CAD, CHF, COPD on home O2, DM, HTN, HLD    PT Comments    Pt supine in bed on arrival.  He refused OOB mobility despite promising PTA he would ambulate if back between 4-5 pm.  He required max cues for encouragement to participate in LE exercises.  Session limited to supine LE strengthening.  Plan for HHPT remains, hopeful to progress to ambulation if patient is willing.      Follow Up Recommendations  Supervision/Assistance - 24 hour;Home health PT     Equipment Recommendations  None recommended by PT    Recommendations for Other Services OT consult     Precautions / Restrictions Precautions Precautions: Fall Precaution Comments: watch sats Restrictions Weight Bearing Restrictions: No    Mobility  Bed Mobility               General bed mobility comments: Pt refused all mobility and deffered session to therapeutic exercises.  Transfers                    Ambulation/Gait                 Stairs             Wheelchair Mobility    Modified Rankin (Stroke Patients Only)       Balance                                            Cognition Arousal/Alertness: Awake/alert Behavior During Therapy: Flat affect Overall Cognitive Status: Difficult to assess                                 General Comments: Difficult to assess as pt not speaking alot.      Exercises General Exercises - Lower Extremity Ankle Circles/Pumps: AROM;Both;20 reps;Seated Quad Sets: AROM;Seated;Both;10 reps Heel Slides: AROM;Both;10 reps;Seated Hip ABduction/ADduction: AROM;Both;10 reps;Seated Straight Leg Raises: Both;10 reps;Seated;AAROM    General Comments        Pertinent Vitals/Pain Pain Assessment: No/denies pain     Home Living                      Prior Function            PT Goals (current goals can now be found in the care plan section) Acute Rehab PT Goals Patient Stated Goal: return home and watch tv Potential to Achieve Goals: Fair Progress towards PT goals: Progressing toward goals    Frequency    Min 3X/week      PT Plan Current plan remains appropriate    Co-evaluation              AM-PAC PT "6 Clicks" Mobility   Outcome Measure  Help needed turning from your back to your side while in a flat bed without using bedrails?: A Little Help needed moving from lying on your back to sitting on the side of a flat bed without using bedrails?: A Little Help needed moving to and from a bed to a chair (including a wheelchair)?: A Little Help needed standing up from a chair using your  arms (e.g., wheelchair or bedside chair)?: A Little Help needed to walk in hospital room?: A Little Help needed climbing 3-5 steps with a railing? : A Lot 6 Click Score: 17    End of Session Equipment Utilized During Treatment: Oxygen;Gait belt Activity Tolerance: Patient limited by fatigue Patient left: with call bell/phone within reach;in bed Nurse Communication: Mobility status PT Visit Diagnosis: Muscle weakness (generalized) (M62.81);Other abnormalities of gait and mobility (R26.89);History of falling (Z91.81)     Time: 1700-1715 PT Time Calculation (min) (ACUTE ONLY): 15 min  Charges:  $Therapeutic Exercise: 8-22 mins                     Governor Rooks, PTA Acute Rehabilitation Services Pager [phone] Office [phone]     Millie Forde Eli Hose 07/21/2019, 5:26 PM

## 2019-07-21 NOTE — Progress Notes (Signed)
PROGRESS NOTE    Nicholas Caldwell  BTD:[id number] DOB: November 18, 1945 DOA: 07/07/2019 PCP: [facility]   Brief Narrative:  73 y.o.male who is wheelchair boundw hypertension, hyperlipidemia, Paroxysmal afib, CAD, CHF(EF 40-45%), Dm2, w neuropathy,[place] home o2 3L [place], OSA, w recent admission 06/08/2019 for acute on chronic respiratory failure secondary to CHF presents with dyspnea for the past 3 week, as well as weight gain of about 15lbs per pt. His breathing has been worse for the past 3 days. Pulse ox was 74% on room air. CXR >Cardiomegaly with interstitial edema and layering effusions. Pro BNP. 2,211.9 Troponin 37. He was last admitted from 8/5-8/10 for CHF exacerbation. He developed acute respiratory failure on 9/5 and required BiPAP x24 hours.He has been declining CPAP at night in spite of extensive discussions by Hospitalist team,dizzy at times after oxycodone use.  During the hospitalization patient was aggressively been diuresed and also received steroids with bronchodilators for COPD exacerbation.   Assessment & Plan:   Principal Problem:   Acute on chronic combined systolic and diastolic CHF (congestive heart failure) (HCC) Active Problems:   Essential hypertension   Type 2 diabetes mellitus with neuropathy   Elevated troponin I level   PAF (paroxysmal atrial fibrillation) (HCC)   Acute CHF (congestive heart failure) ([place])   Palliative care by specialist   DNR (do not resuscitate) discussion   Fatigue  Acute on chronic hypoxic respiratory failure, baseline 3 L nasal cannula.  Currently 4 L nasal cannula -Multifactorial with COPD exacerbation and fluid overload.  Slow to improve.  4 L at rest but with ambulation desat to 80% -Each issues discussed below.  Acute congestive heart failure with preserved ejection fraction, RV failure, ejection fraction 65%, class III - Increase Lasix to 80 mg every 8 hours. -Closely monitor renal function.  Continue Coreg and aspirin  -BiDil on hold.  Will resume when appropriate  Acute on chronic exacerbation of COPD, persistent - Xopenex and ipratropium scheduled. -Continue Dulera -Increase Solu-Medrol 40 mg IV q8hrs.  -Supplemental oxygen, wean off as necessary.  Bilateral chronic venous stasis edema Right lower extremity necrotic toe with foul-smelling - Surprisingly his ABIs are normal (PreLim).  Doxycycline day 3.  Follow-up outpatient with his podiatry at the [facility]  Morbid obesity with BMI greater than 35 - Combination of obstructive sleep apnea/hypoventilation syndrome.  Not necessarily compliant with his CPAP.  Chronic pain with opioid dependence -Resume home meds.  With caution  Diabetes mellitus type 2, insulin-dependent Diabetic neuropathy - Continue 70/30 insulin to 17 units twice daily.  Continue pre-meal insulin to 8 units -Insulin sliding scale Accu-Chek -Continue gabapentin  CKD stage IV - Baseline creatinine 1.8.  Today is close to baseline around 1.5  Closely monitor.    DVT prophylaxis: Eliquis Code Status: Full code Family Communication: NOne Disposition Plan: Diffuse expiratory wheezing and hypoxia with ambulation.  Maintain hospital stay for aggressive breathing treatments and diuretics Consultants:   None  Procedures:   None  Antimicrobials:   Doxycycline D3   Subjective: Patient upset that he cannot go home but he understands that with ambulation he gets hypoxic down to 80% on 4 L nasal cannula.  At rest he saturating 92%.  Review of Systems Otherwise negative except as per HPI, including: General = no fevers, chills, dizziness, malaise, fatigue HEENT/EYES = negative for pain, redness, loss of vision, double vision, blurred vision, loss of hearing, sore throat, hoarseness, dysphagia Cardiovascular= negative for chest pain, palpitation, murmurs, lower extremity swelling Respiratory/lungs= negative forhemoptysis, wheezing,  mucus production Gastrointestinal= negative for  nausea, vomiting,, abdominal pain, melena, hematemesis Genitourinary= negative for Dysuria, Hematuria, Change in Urinary Frequency MSK = Negative for arthralgia, myalgias, Back Pain, Joint swelling  Neurology= Negative for headache, seizures, numbness, tingling  Psychiatry= Negative for anxiety, depression, suicidal and homocidal ideation Allergy/Immunology= Medication/Food allergy as listed  Skin= Negative for Rash, lesions, ulcers, itching   Objective: Vitals:   07/21/19 0400 07/21/19 0636 07/21/19 0730 07/21/19 0853  BP:   126/72   Pulse: 61  67 67  Resp: (!) 23  (!) 22 18  Temp:   98.2 F (36.8 C)   TempSrc:   Axillary   SpO2: 99%  97% 99%  Weight:  122.3 kg    Height:        Intake/Output Summary (Last 24 hours) at 07/21/2019 1027 Last data filed at 07/21/2019 0950 Gross per 24 hour  Intake 1440 ml  Output 5250 ml  Net -3810 ml   Filed Weights   07/19/19 0459 07/20/19 0304 07/21/19 0636  Weight: 123.3 kg 123.8 kg 122.3 kg    Examination Constitutional: NAD, calm, comfortable, 4 L nasal cannula Eyes: PERRL, lids and conjunctivae normal ENMT: Mucous membranes are moist. Posterior pharynx clear of any exudate or lesions.Normal dentition.  Neck: normal, supple, no masses, no thyromegaly Respiratory: Diffuse expiratory wheezing Cardiovascular: Regular rate and rhythm, no murmurs / rubs / gallops.  2+ bilateral lower extremity pitting edema. 2+ pedal pulses. No carotid bruits.  Abdomen: no tenderness, no masses palpated. No hepatosplenomegaly. Bowel sounds positive.  Musculoskeletal: no clubbing / cyanosis. No joint deformity upper and lower extremities. Good ROM, no contractures. Normal muscle tone.  Skin: Appears to have mild celluliti- toe Neurologic: CN 2-12 grossly intact. Sensation intact, DTR normal. Strength 5/5 in all 4.  Psychiatric: Normal judgment and insight. Alert and oriented x 3. Normal mood.     Data Reviewed:   CBC: Recent Labs  Lab 07/17/19 0216  07/18/19 0247 07/19/19 0230 07/20/19 0221 07/21/19 0300  WBC 6.0 4.7 7.5 9.7 10.3  HGB 8.1* 8.3* 8.3* 8.4* 8.2*  HCT 27.4* 28.2* 26.8* 26.9* 27.9*  MCV 86.2 86.0 84.5 85.1 86.1  PLT 299 321 316 328 201   Basic Metabolic Panel: Recent Labs  Lab 07/17/19 0216 07/18/19 0247 07/19/19 0230 07/20/19 0221 07/21/19 0300  NA 141 140 140 141 139  K 3.7 3.9 3.8 3.7 3.5  CL 91* 90* 90* 91* 90*  CO2 39* 40* 38* 38* 39*  GLUCOSE 129* 208* 205* 168* 189*  BUN 46* 47* 47* 44* 41*  CREATININE 1.77* 1.86* 1.61* 1.50* 1.56*  CALCIUM 8.5* 8.7* 8.6* 8.6* 8.6*  MG  --   --  2.9* 2.9* 2.7*   GFR: Estimated Creatinine Clearance: 58.7 mL/min (A) (by C-G formula based on SCr of 1.56 mg/dL (H)). Liver Function Tests: No results for input(s): AST, ALT, ALKPHOS, BILITOT, PROT, ALBUMIN in the last 168 hours. No results for input(s): LIPASE, AMYLASE in the last 168 hours. No results for input(s): AMMONIA in the last 168 hours. Coagulation Profile: No results for input(s): INR, PROTIME in the last 168 hours. Cardiac Enzymes: No results for input(s): CKTOTAL, CKMB, CKMBINDEX, TROPONINI in the last 168 hours. BNP (last 3 results) No results for input(s): PROBNP in the last 8760 hours. HbA1C: No results for input(s): HGBA1C in the last 72 hours. CBG: Recent Labs  Lab 07/20/19 0603 07/20/19 1050 07/20/19 1600 07/20/19 2121 07/21/19 0607  GLUCAP 190* 200* 227* 241* 165*   Lipid Profile:  No results for input(s): CHOL, HDL, LDLCALC, TRIG, CHOLHDL, LDLDIRECT in the last 72 hours. Thyroid Function Tests: No results for input(s): TSH, T4TOTAL, FREET4, T3FREE, THYROIDAB in the last 72 hours. Anemia Panel: No results for input(s): VITAMINB12, FOLATE, FERRITIN, TIBC, IRON, RETICCTPCT in the last 72 hours. Sepsis Labs: Recent Labs  Lab 07/18/19 1227  PROCALCITON 0.15    No results found for this or any previous visit (from the past 240 hour(s)).       Radiology Studies: No results found.       Scheduled Meds: . apixaban  5 mg Oral BID  . aspirin  81 mg Oral Daily  . carvedilol  3.125 mg Oral BID WC  . chlorhexidine  15 mL Mouth Rinse BID  . doxycycline  100 mg Oral Q12H  . ferrous sulfate  325 mg Oral BID  . furosemide  80 mg Intravenous Q8H  . gabapentin  300 mg Oral BID  . guaiFENesin  600 mg Oral BID  . insulin aspart  8 Units Subcutaneous TID WC  . insulin aspart protamine- aspart  17 Units Subcutaneous BID WC  . ipratropium  0.5 mg Nebulization Q6H  . levalbuterol  0.63 mg Nebulization Q6H  . mouth rinse  15 mL Mouth Rinse q12n4p  . methylPREDNISolone (SOLU-MEDROL) injection  40 mg Intravenous Q8H  . mometasone-formoterol  2 puff Inhalation BID  . naLOXone (NARCAN)  injection  0.4 mg Intravenous Once  . oxybutynin  5 mg Oral QID  . pantoprazole  40 mg Oral BID  . pneumococcal 23 valent vaccine  0.5 mL Intramuscular Tomorrow-1000  . polyethylene glycol  17 g Oral BID  . potassium chloride  40 mEq Oral Once  . rosuvastatin  20 mg Oral q1800  . sodium chloride flush  3 mL Intravenous Q12H  . tamsulosin  0.4 mg Oral QPC supper   Continuous Infusions: . sodium chloride       LOS: 13 days   Time spent= 35 mins    Lanna Labella Arsenio Loader, MD [facility]  If 7PM-7AM, please contact night-coverage [email or web address] 07/21/2019, 10:27 AM

## 2019-07-22 ENCOUNTER — Encounter (HOSPITAL_COMMUNITY): Payer: Non-veteran care

## 2019-07-22 ENCOUNTER — Telehealth (HOSPITAL_COMMUNITY): Payer: Self-pay | Admitting: *Deleted

## 2019-07-22 LAB — CBC
HCT: 27.8 % — ABNORMAL LOW (ref 39.0–52.0)
Hemoglobin: 8.6 g/dL — ABNORMAL LOW (ref 13.0–17.0)
MCH: 26 pg (ref 26.0–34.0)
MCHC: 30.9 g/dL (ref 30.0–36.0)
MCV: 84 fL (ref 80.0–100.0)
Platelets: 344 10*3/uL (ref 150–400)
RBC: 3.31 MIL/uL — ABNORMAL LOW (ref 4.22–5.81)
RDW: 17.5 % — ABNORMAL HIGH (ref 11.5–15.5)
WBC: 9.5 10*3/uL (ref 4.0–10.5)
nRBC: 0 % (ref 0.0–0.2)

## 2019-07-22 LAB — BASIC METABOLIC PANEL
Anion gap: 12 (ref 5–15)
BUN: 45 mg/dL — ABNORMAL HIGH (ref 8–23)
CO2: 41 mmol/L — ABNORMAL HIGH (ref 22–32)
Calcium: 8.7 mg/dL — ABNORMAL LOW (ref 8.9–10.3)
Chloride: 85 mmol/L — ABNORMAL LOW (ref 98–111)
Creatinine, Ser: 1.69 mg/dL — ABNORMAL HIGH (ref 0.61–1.24)
GFR calc Af Amer: 46 mL/min — ABNORMAL LOW (ref >60)
GFR calc non Af Amer: 40 mL/min — ABNORMAL LOW (ref >60)
Glucose, Bld: 172 mg/dL — ABNORMAL HIGH (ref 70–99)
Potassium: 3.5 mmol/L (ref 3.5–5.1)
Sodium: 138 mmol/L (ref 135–145)

## 2019-07-22 LAB — GLUCOSE, CAPILLARY
Glucose-Capillary: 203 mg/dL — ABNORMAL HIGH (ref 70–99)
Glucose-Capillary: 205 mg/dL — ABNORMAL HIGH (ref 70–99)

## 2019-07-22 LAB — MAGNESIUM: Magnesium: 2.7 mg/dL — ABNORMAL HIGH (ref 1.7–2.4)

## 2019-07-22 MED ORDER — PREDNISONE 10 MG PO TABS: ORAL_TABLET | ORAL | 0 refills | Status: AC

## 2019-07-22 MED ORDER — CARVEDILOL 3.125 MG PO TABS: 3.1250 mg | ORAL_TABLET | Freq: Two times a day (BID) | ORAL | 0 refills | Status: DC

## 2019-07-22 MED ORDER — DOXYCYCLINE HYCLATE 100 MG PO TABS: 100.0000 mg | ORAL_TABLET | Freq: Two times a day (BID) | ORAL | 0 refills | Status: AC

## 2019-07-22 MED ORDER — POTASSIUM CHLORIDE CRYS ER 20 MEQ PO TBCR
40.0000 meq | EXTENDED_RELEASE_TABLET | Freq: Once | ORAL | Status: AC
  Administered 2019-07-22: 40 meq via ORAL
  Filled 2019-07-22: qty 2

## 2019-07-22 NOTE — Progress Notes (Signed)
PT Cancellation Note  Patient Details Name: Nicholas Caldwell MRN: [id number] DOB: 09-28-1946   Cancelled Treatment:    Reason Eval/Treat Not Completed: (P) Patient declined, no reason specified(Pt avoid eye contact and acted as if therapist was not in the room.  PTA left after waiting for 6 min for a response.  Pt continues to present with self limiting behavior.  This appears to have been a pattern during the hospitalization.)   Tanveer Brammer Eli Hose 07/22/2019, 11:38 AM Governor Rooks, PTA Acute Rehabilitation Services Pager [phone] Office [phone]

## 2019-07-22 NOTE — Discharge Summary (Signed)
Physician Discharge Summary  Nicholas Caldwell ALP:[id number] DOB: [birth year] DOA: 07/07/2019  PCP: [facility]  Admit date: 07/07/2019 Discharge date: 07/22/2019  Admitted From: Home Disposition: Home with home health  Recommendations for Outpatient Follow-up:  1. Follow up with PCP in 1-2 weeks 2. Please obtain BMP/CBC in one week your next doctors visit.  3. 1 week prednisone taper.  Advised to continue using home bronchodilators periodically 4. Outpatient follow-up with CHF team within 1-2 weeks.  Message sent to cardiology coordinator. 5. Advised to remain compliant with fluid intake and limit to 1800 cc daily.  Take metolazone and torsemide as prescribed 6. Doxycycline orally for 3 days  Home Health: PT Equipment/Devices: None Discharge Condition: Stable CODE STATUS: Full Diet recommendation: Heart healthy 1800 cc fluid restriction  Brief/Interim Summary: 73 y.o.male who is wheelchair boundw hypertension, hyperlipidemia, Paroxysmal afib, CAD, CHF(EF 40-45%), Dm2, w neuropathy,[place] home o2 3L [place], OSA, w recent admission 06/08/2019 for acute on chronic respiratory failure secondary to CHF presents with dyspnea for the past 3 week, as well as weight gain of about 15lbs per pt. His breathing has been worse for the past 3 days. Pulse ox was 74% on room air. CXR >Cardiomegaly with interstitial edema and layering effusions. Pro BNP. 2,211.9 Troponin 37. He was last admitted from 8/5-8/10 for CHF exacerbation. He developed acute respiratory failure on 9/5 and required BiPAP x24 hours.Hehas beendecliningCPAP at night in spite of extensive discussions by Hospitalist team,dizzy at times after oxycodone use.  During the hospitalization patient was aggressively been diuresed and also received steroids with bronchodilators for COPD exacerbation.  With bronchodilators, steroids, doxycycline and diuretics patient took several days to start feeling better. He was diuresed about 23 L of  fluid.  He is no longer wheezing today and back to baseline of 3 L nasal cannula.  Stable for discharge.   Discharge Diagnoses:  Principal Problem:   Acute on chronic combined systolic and diastolic CHF (congestive heart failure) (HCC) Active Problems:   Essential hypertension   Type 2 diabetes mellitus with neuropathy   Elevated troponin I level   PAF (paroxysmal atrial fibrillation) (HCC)   Acute CHF (congestive heart failure) ([place])   Palliative care by specialist   DNR (do not resuscitate) discussion   Fatigue   Acute on chronic hypoxic respiratory failure, baseline 3 L nasal cannula.  Resolved.  Down to 3 L nasal cannula -Multifactorial with COPD exacerbation and fluid overload. .    Significantly improved.  Down to 3 L nasal cannula now.  Patient is no longer wheezing -Each issues discussed below.  Acute congestive heart failure with preserved ejection fraction, RV failure, ejection fraction 65%, class II - Transition back to home torsemide and metolazone. -Resume home medications.  Coreg has been added. -Spoke with cardiology coordinator for outpatient follow-up with CHF team in 1-2 weeks.  Acute on chronic exacerbation of COPD, improved -  Much improved.  Prescribed 7-day prednisone taper.  Advised to continue home bronchodilators.  Bilateral chronic venous stasis edema Right lower extremity necrotic toe with foul-smelling, improving - Surprisingly his ABIs are normal.  3 days of oral doxycycline.  Outpatient follow-up with [facility] in next 1 week  Morbid obesity with BMI greater than 35 - Combination of obstructive sleep apnea/hypoventilation syndrome.  Not necessarily compliant with his CPAP.  Chronic pain with opioid dependence -Resume home meds.  With caution  Diabetes mellitus type 2, insulin-dependent Diabetic neuropathy -  Resume home gabapentin and home medications  CKD stage IV -  Renal function improved to baseline of  1.69  Consultations:  None  Subjective: Wants to go home.  He says he feels much better no longer wheezing.  Down to 3 L nasal cannula.  Minimal shortness of breath with ambulating.  Discharge Exam: Vitals:   07/22/19 0748 07/22/19 0755  BP:  114/61  Pulse:  66  Resp:  20  Temp:  97.9 F (36.6 C)  SpO2: 97% 100%   Vitals:   07/22/19 0551 07/22/19 0746 07/22/19 0748 07/22/19 0755  BP:    114/61  Pulse:    66  Resp:    20  Temp:    97.9 F (36.6 C)  TempSrc:    Oral  SpO2:  98% 97% 100%  Weight: 120 kg     Height:        General: Pt is alert, awake, not in acute distress Cardiovascular: RRR, S1/S2 +, no rubs, no gallops, 1+ bilateral lower extremity pitting edema Respiratory: Minimal bibasilar crackles Abdominal: Soft, NT, ND, bowel sounds + Extremities: no cyanosis  Discharge Instructions   Allergies as of 07/22/2019   No Known Allergies     Medication List    TAKE these medications   albuterol 108 (90 Base) MCG/ACT inhaler Commonly known as: VENTOLIN HFA Inhale 2 puffs into the lungs every 6 (six) hours as needed for wheezing or shortness of breath.   apixaban 5 MG Tabs tablet Commonly known as: ELIQUIS Take 1 tablet (5 mg total) by mouth 2 (two) times daily.   aspirin 81 MG EC tablet Take 1 tablet (81 mg total) by mouth daily.   budesonide-formoterol 160-4.5 MCG/ACT inhaler Commonly known as: SYMBICORT Inhale 2 puffs into the lungs as needed (shortness of breath).   carvedilol 3.125 MG tablet Commonly known as: COREG Take 1 tablet (3.125 mg total) by mouth 2 (two) times daily with a meal.   doxycycline 100 MG tablet Commonly known as: VIBRA-TABS Take 1 tablet (100 mg total) by mouth every 12 (twelve) hours for 3 days.   ferrous sulfate 325 (65 FE) MG tablet Take 325 mg by mouth 2 (two) times daily.   fluticasone 50 MCG/ACT nasal spray Commonly known as: FLONASE Place 2 sprays into both nostrils daily as needed for allergies.   gabapentin  300 MG capsule Commonly known as: NEURONTIN Take 1 capsule (300 mg total) by mouth 2 (two) times daily.   hydrocortisone 1 % lotion Apply 1 application topically See admin instructions. Apply small amount to back twice daily for itchy rash as needed   hydrocortisone 2.5 % rectal cream Commonly known as: ANUSOL-HC Place 1 application rectally 2 (two) times daily as needed for hemorrhoids or itching.   insulin aspart 100 UNIT/ML injection Commonly known as: novoLOG Inject 4 Units into the skin 3 (three) times daily with meals.   insulin aspart protamine - aspart (70-30) 100 UNIT/ML FlexPen Commonly known as: NovoLOG Mix 70/30 FlexPen Inject 0.1 mLs (10 Units total) into the skin 2 (two) times daily.   ipratropium-albuterol 0.5-2.5 (3) MG/3ML Soln Commonly known as: DUONEB Take 3 mLs by nebulization 2 (two) times daily as needed (shortness of breath/wheezing).   isosorbide-hydrALAZINE 20-37.5 MG tablet Commonly known as: BIDIL Take 0.5 tablets by mouth 3 (three) times daily.   metolazone 2.5 MG tablet Commonly known as: ZAROXOLYN Take 1 tablet (2.5 mg total) by mouth 4 (four) times a week. Take extra Potassium 18mq when taking this medication   oxybutynin 5 MG tablet Commonly known as: DITROPAN Take  1 tablet (5 mg total) by mouth 4 (four) times daily.   oxyCODONE-acetaminophen 10-325 MG tablet Commonly known as: PERCOCET Take 1 tablet by mouth every 6 (six) hours as needed for pain.   OXYGEN Inhale 3 L into the lungs continuous.   pantoprazole 40 MG tablet Commonly known as: PROTONIX Take 1 tablet (40 mg total) by mouth 2 (two) times daily.   polyethylene glycol 17 g packet Commonly known as: MIRALAX / GLYCOLAX Take 17 g by mouth daily. What changed:   when to take this  reasons to take this   potassium chloride SA 20 MEQ tablet Commonly known as: K-DUR Take 3 tablets (60 mEq total) by mouth 2 (two) times daily. With additional 67mq when you take Metolazone.    predniSONE 10 MG tablet Commonly known as: DELTASONE Take 4 tablets (40 mg total) by mouth 2 (two) times daily with a meal for 3 days, THEN 4 tablets (40 mg total) daily with breakfast for 2 days, THEN 2 tablets (20 mg total) daily with breakfast for 2 days. Start taking on: July 22, 2019   PRESCRIPTION MEDICATION Cpap   rosuvastatin 20 MG tablet Commonly known as: CRESTOR Take 1 tablet (20 mg total) by mouth daily at 6 PM.   sodium chloride 0.65 % Soln nasal spray Commonly known as: OCEAN Place 2 sprays into both nostrils as needed for congestion.   tamsulosin 0.4 MG Caps capsule Commonly known as: FLOMAX Take 1 capsule (0.4 mg total) by mouth daily after supper.   torsemide 20 MG tablet Commonly known as: DEMADEX Take 4 tablets (80 mg total) by mouth 2 (two) times daily.      Follow-up Information    [facility] Schedule an appointment as soon as possible for a visit in 1 week(s).   Contact information: [phone]       MLarey Dresser MD. Schedule an appointment as soon as possible for a visit in 1 week(s).   Specialty: Cardiology Contact information: [address] [phone]         No Known Allergies  You were cared for by a hospitalist during your hospital stay. If you have any questions about your discharge medications or the care you received while you were in the hospital after you are discharged, you can call the unit and asked to speak with the hospitalist on call if the hospitalist that took care of you is not available. Once you are discharged, your primary care physician will handle any further medical issues. Please note that no refills for any discharge medications will be authorized once you are discharged, as it is imperative that you return to your primary care physician (or establish a relationship with a primary care  physician if you do not have one) for your aftercare needs so that they can reassess your need for medications and monitor your lab values.   Procedures/Studies: Dg Chest Port 1 View  Result Date: 07/18/2019 CLINICAL DATA:  Dyspnea EXAM: PORTABLE CHEST 1 VIEW COMPARISON:  Chest radiograph dated 07/07/2019 FINDINGS: The heart size remains enlarged. There are small bilateral pleural effusions with associated atelectasis/airspace disease, increased on the right and unchanged on the left compared to 07/07/2019. There is mild pulmonary vascular congestion. No pneumothorax is identified the visualized skeletal structures are unremarkable. IMPRESSION: Increased right and unchanged left pleural effusions with associated atelectasis/airspace disease. Mild pulmonary vascular congestion. Electronically Signed  By: Zerita Boers M.D.   On: 07/18/2019 13:16   Dg Chest Port 1 View  Result Date: 07/07/2019 CLINICAL DATA:  Patient with shortness of breath. EXAM: PORTABLE CHEST 1 VIEW COMPARISON:  Chest radiograph 06/08/2019 FINDINGS: Monitoring leads overlie the patient. Stable cardiomegaly. Diffuse bilateral interstitial pulmonary opacities. Small bilateral pleural effusions, left greater than right. No pneumothorax. IMPRESSION: Cardiomegaly with interstitial edema and layering effusions. Electronically Signed   By: Lovey Newcomer M.D.   On: 07/07/2019 18:53   Vas [place] Burnard Bunting With/wo Tbi  Result Date: 07/19/2019 LOWER EXTREMITY DOPPLER STUDY Indications: Ulceration, and gangrene. High Risk Factors: Diabetes, current smoker.  Performing Technologist: June Leap RDMS, RVT  Examination Guidelines: A complete evaluation includes at minimum, Doppler waveform signals and systolic blood pressure reading at the level of bilateral brachial, anterior tibial, and posterior tibial arteries, when vessel segments are accessible. Bilateral testing is considered an integral part of a complete examination. Photoelectric Plethysmograph (PPG)  waveforms and toe systolic pressure readings are included as required and additional duplex testing as needed. Limited examinations for reoccurring indications may be performed as noted.  ABI Findings: +--------+------------------+-----+---------+--------+ Right   Rt Pressure (mmHg)IndexWaveform Comment  +--------+------------------+-----+---------+--------+ [id number]                    triphasic         +--------+------------------+-----+---------+--------+ ATA     138               1.08 triphasic         +--------+------------------+-----+---------+--------+ PTA     129               1.01 triphasic         +--------+------------------+-----+---------+--------+ +--------+------------------+-----+---------+-------+ Left    Lt Pressure (mmHg)IndexWaveform Comment +--------+------------------+-----+---------+-------+ [id number]                    triphasic        +--------+------------------+-----+---------+-------+ ATA     164               1.28 triphasic        +--------+------------------+-----+---------+-------+ PTA     144               1.12 triphasic        +--------+------------------+-----+---------+-------+  Summary: Right: Resting right ankle-brachial index is within normal range. No evidence of significant right lower extremity arterial disease. Left: Resting left ankle-brachial index is within normal range. No evidence of significant left lower extremity arterial disease.  *See table(s) above for measurements and observations.  Electronically signed by Deitra Mayo MD on 07/19/2019 at 2:56:20 PM.   Final       The results of significant diagnostics from this hospitalization (including imaging, microbiology, ancillary and laboratory) are listed below for reference.     Microbiology: No results found for this or any previous visit (from the past 240 hour(s)).   Labs: BNP (last 3 results) Recent Labs    06/08/19 0446 07/07/19 1750  07/18/19 1227  BNP 2,301.2* 2,211.9* 8,453.6*   Basic Metabolic Panel: Recent Labs  Lab 07/18/19 0247 07/19/19 0230 07/20/19 0221 07/21/19 0300 07/22/19 0129  NA 140 140 141 139 138  K 3.9 3.8 3.7 3.5 3.5  CL 90* 90* 91* 90* 85*  CO2 40* 38* 38* 39* 41*  GLUCOSE 208* 205* 168* 189* 172*  BUN 47* 47* 44* 41* 45*  CREATININE 1.86* 1.61* 1.50* 1.56* 1.69*  CALCIUM 8.7* 8.6* 8.6* 8.6* 8.7*  MG  --  2.9* 2.9* 2.7* 2.7*   Liver Function Tests: No results for input(s): AST, ALT, ALKPHOS, BILITOT, PROT, ALBUMIN in the last 168 hours. No results for input(s): LIPASE, AMYLASE in the last 168 hours. No results for input(s): AMMONIA in the last 168 hours. CBC: Recent Labs  Lab 07/18/19 0247 07/19/19 0230 07/20/19 0221 07/21/19 0300 07/22/19 0129  WBC 4.7 7.5 9.7 10.3 9.5  HGB 8.3* 8.3* 8.4* 8.2* 8.6*  HCT 28.2* 26.8* 26.9* 27.9* 27.8*  MCV 86.0 84.5 85.1 86.1 84.0  PLT 321 316 328 341 344   Cardiac Enzymes: No results for input(s): CKTOTAL, CKMB, CKMBINDEX, TROPONINI in the last 168 hours. BNP: Invalid input(s): POCBNP CBG: Recent Labs  Lab 07/21/19 0607 07/21/19 1141 07/21/19 1648 07/21/19 2017 07/22/19 0624  GLUCAP 165* 122* 119* 283* 203*   D-Dimer No results for input(s): DDIMER in the last 72 hours. Hgb A1c No results for input(s): HGBA1C in the last 72 hours. Lipid Profile No results for input(s): CHOL, HDL, LDLCALC, TRIG, CHOLHDL, LDLDIRECT in the last 72 hours. Thyroid function studies No results for input(s): TSH, T4TOTAL, T3FREE, THYROIDAB in the last 72 hours.  Invalid input(s): FREET3 Anemia work up No results for input(s): VITAMINB12, FOLATE, FERRITIN, TIBC, IRON, RETICCTPCT in the last 72 hours. Urinalysis    Component Value Date/Time   COLORURINE STRAW (A) 07/08/2019 0008   APPEARANCEUR CLEAR 07/08/2019 0008   LABSPEC 1.006 07/08/2019 0008   PHURINE 6.0 07/08/2019 0008   GLUCOSEU NEGATIVE 07/08/2019 0008   HGBUR NEGATIVE 07/08/2019 0008    BILIRUBINUR NEGATIVE 07/08/2019 0008   KETONESUR NEGATIVE 07/08/2019 0008   PROTEINUR NEGATIVE 07/08/2019 0008   UROBILINOGEN 0.2 10/26/2014 2206   NITRITE NEGATIVE 07/08/2019 0008   LEUKOCYTESUR NEGATIVE 07/08/2019 0008   Sepsis Labs Invalid input(s): PROCALCITONIN,  WBC,  LACTICIDVEN Microbiology No results found for this or any previous visit (from the past 240 hour(s)).   Time coordinating discharge:  I have spent 35 minutes face to face with the patient and on the ward discussing the patients care, assessment, plan and disposition with other care givers. >50% of the time was devoted counseling the patient about the risks and benefits of treatment/Discharge disposition and coordinating care.   SIGNED:   Damita Lack, MD  [facility] 07/22/2019, 11:04 AM   If 7PM-7AM, please contact night-coverage [email or web address]

## 2019-07-22 NOTE — Progress Notes (Signed)
Patient's wife arrived.  Discharge instructions, follow  up appointments and medications reviewed with them both.  Home health arranged by Case management.  Both verbalize understanding and questions answered.  Wife brought patient's home oxygen.  Via wheelchair to wife's waiting car.  Home oxygen applied

## 2019-07-22 NOTE — Telephone Encounter (Signed)
pts wife called to schedule hosp f/u. I called pt tp schedule hosp f/u. No answer/left vm for him to call and schedule appt.

## 2019-07-23 DIAGNOSIS — N179 Acute kidney failure, unspecified: Secondary | ICD-10-CM | POA: Diagnosis not present

## 2019-07-23 DIAGNOSIS — E1122 Type 2 diabetes mellitus with diabetic chronic kidney disease: Secondary | ICD-10-CM | POA: Diagnosis not present

## 2019-07-23 DIAGNOSIS — J9621 Acute and chronic respiratory failure with hypoxia: Secondary | ICD-10-CM | POA: Diagnosis not present

## 2019-07-23 DIAGNOSIS — J9622 Acute and chronic respiratory failure with hypercapnia: Secondary | ICD-10-CM | POA: Diagnosis not present

## 2019-07-23 DIAGNOSIS — I5023 Acute on chronic systolic (congestive) heart failure: Secondary | ICD-10-CM | POA: Diagnosis not present

## 2019-07-23 DIAGNOSIS — I081 Rheumatic disorders of both mitral and tricuspid valves: Secondary | ICD-10-CM | POA: Diagnosis not present

## 2019-07-23 DIAGNOSIS — E876 Hypokalemia: Secondary | ICD-10-CM | POA: Diagnosis not present

## 2019-07-23 DIAGNOSIS — I429 Cardiomyopathy, unspecified: Secondary | ICD-10-CM | POA: Diagnosis not present

## 2019-07-23 DIAGNOSIS — Z79891 Long term (current) use of opiate analgesic: Secondary | ICD-10-CM | POA: Diagnosis not present

## 2019-07-23 DIAGNOSIS — G8929 Other chronic pain: Secondary | ICD-10-CM | POA: Diagnosis not present

## 2019-07-23 DIAGNOSIS — Z9981 Dependence on supplemental oxygen: Secondary | ICD-10-CM | POA: Diagnosis not present

## 2019-07-23 DIAGNOSIS — N183 Chronic kidney disease, stage 3 (moderate): Secondary | ICD-10-CM | POA: Diagnosis not present

## 2019-07-23 DIAGNOSIS — I130 Hypertensive heart and chronic kidney disease with heart failure and stage 1 through stage 4 chronic kidney disease, or unspecified chronic kidney disease: Secondary | ICD-10-CM | POA: Diagnosis not present

## 2019-07-23 DIAGNOSIS — Z794 Long term (current) use of insulin: Secondary | ICD-10-CM | POA: Diagnosis not present

## 2019-07-23 DIAGNOSIS — I50810 Right heart failure, unspecified: Secondary | ICD-10-CM | POA: Diagnosis not present

## 2019-07-23 DIAGNOSIS — Z8673 Personal history of transient ischemic attack (TIA), and cerebral infarction without residual deficits: Secondary | ICD-10-CM | POA: Diagnosis not present

## 2019-07-23 DIAGNOSIS — E662 Morbid (severe) obesity with alveolar hypoventilation: Secondary | ICD-10-CM | POA: Diagnosis not present

## 2019-07-23 DIAGNOSIS — J449 Chronic obstructive pulmonary disease, unspecified: Secondary | ICD-10-CM | POA: Diagnosis not present

## 2019-07-23 DIAGNOSIS — I5082 Biventricular heart failure: Secondary | ICD-10-CM | POA: Diagnosis not present

## 2019-07-23 DIAGNOSIS — Z9111 Patient's noncompliance with dietary regimen: Secondary | ICD-10-CM | POA: Diagnosis not present

## 2019-07-23 DIAGNOSIS — E785 Hyperlipidemia, unspecified: Secondary | ICD-10-CM | POA: Diagnosis not present

## 2019-07-23 DIAGNOSIS — I25119 Atherosclerotic heart disease of native coronary artery with unspecified angina pectoris: Secondary | ICD-10-CM | POA: Diagnosis not present

## 2019-07-23 DIAGNOSIS — I480 Paroxysmal atrial fibrillation: Secondary | ICD-10-CM | POA: Diagnosis not present

## 2019-07-23 DIAGNOSIS — I0981 Rheumatic heart failure: Secondary | ICD-10-CM | POA: Diagnosis not present

## 2019-07-23 DIAGNOSIS — I4892 Unspecified atrial flutter: Secondary | ICD-10-CM | POA: Diagnosis not present

## 2019-07-25 DIAGNOSIS — Z79891 Long term (current) use of opiate analgesic: Secondary | ICD-10-CM | POA: Diagnosis not present

## 2019-07-25 DIAGNOSIS — G894 Chronic pain syndrome: Secondary | ICD-10-CM | POA: Diagnosis not present

## 2019-07-25 DIAGNOSIS — M5136 Other intervertebral disc degeneration, lumbar region: Secondary | ICD-10-CM | POA: Diagnosis not present

## 2019-07-25 DIAGNOSIS — M47816 Spondylosis without myelopathy or radiculopathy, lumbar region: Secondary | ICD-10-CM | POA: Diagnosis not present

## 2019-07-25 DIAGNOSIS — M169 Osteoarthritis of hip, unspecified: Secondary | ICD-10-CM | POA: Diagnosis not present

## 2019-07-25 DIAGNOSIS — M25551 Pain in right hip: Secondary | ICD-10-CM | POA: Diagnosis not present

## 2019-07-26 DIAGNOSIS — I130 Hypertensive heart and chronic kidney disease with heart failure and stage 1 through stage 4 chronic kidney disease, or unspecified chronic kidney disease: Secondary | ICD-10-CM | POA: Diagnosis not present

## 2019-07-26 DIAGNOSIS — J9621 Acute and chronic respiratory failure with hypoxia: Secondary | ICD-10-CM | POA: Diagnosis not present

## 2019-07-26 DIAGNOSIS — I5082 Biventricular heart failure: Secondary | ICD-10-CM | POA: Diagnosis not present

## 2019-07-26 DIAGNOSIS — I25119 Atherosclerotic heart disease of native coronary artery with unspecified angina pectoris: Secondary | ICD-10-CM | POA: Diagnosis not present

## 2019-07-26 DIAGNOSIS — I0981 Rheumatic heart failure: Secondary | ICD-10-CM | POA: Diagnosis not present

## 2019-07-26 DIAGNOSIS — J9622 Acute and chronic respiratory failure with hypercapnia: Secondary | ICD-10-CM | POA: Diagnosis not present

## 2019-07-27 ENCOUNTER — Other Ambulatory Visit (HOSPITAL_COMMUNITY): Payer: Self-pay

## 2019-07-27 NOTE — Progress Notes (Signed)
Paramedicine Encounter    Patient ID: Nicholas Caldwell, male    DOB: [birth year], 73 y.o.   MRN: [id number]   Patient Care Team: [facility] PCP - General Jettie Booze, MD as Consulting Physician (Cardiology) Sharmon Revere as Physician Assistant (Cardiology) [name], MD as Attending Physician (Internal Medicine) Thompson Grayer, MD as Consulting Physician (Clinical Cardiac Electrophysiology)  Patient Active Problem List   Diagnosis Date Noted  . Palliative care by specialist   . DNR (do not resuscitate) discussion   . Fatigue   . Acute CHF (congestive heart failure) ([place]) 07/07/2019  . Acute on chronic heart failure ([place]) 06/09/2019  . Lobar pneumonia ([place]) 04/14/2019  . Hyperkalemia 04/10/2019  . Cerebral embolism with cerebral infarction 03/21/2019  . Gastritis and gastroduodenitis   . NSVT (nonsustained ventricular tachycardia) ([place]) 03/08/2019  . Tobacco abuse counseling 03/08/2019  . Iron deficiency anemia   . Acute on chronic systolic CHF (congestive heart failure) ([place]) 03/06/2019  . Diabetes mellitus type 2 in obese ([place])   . Primary osteoarthritis of right hip   . Hypomagnesemia   . Steroid-induced hyperglycemia   . Supplemental oxygen dependent   . Leukocytosis   . Hypokalemia   . Acute on chronic anemia   . Diabetic peripheral neuropathy ([place])   . Acute on chronic systolic (congestive) heart failure ([place])   . Acute on chronic respiratory failure ([place]) 01/14/2019  . Hypoxia   . Altered mental status   . Heart failure ([place]) 12/30/2018  . AKI (acute kidney injury) ([place])   . Acute respiratory failure ([place]) 11/22/2018  . Acute on chronic respiratory failure with hypoxia and hypercapnia ([place]) 11/13/2018  . Metabolic encephalopathy 58/52/7782  . Acute respiratory failure with hypoxia and hypercapnia ([place]) 07/26/2018  . Acute exacerbation of CHF (congestive heart failure) ([place]) 07/26/2018  . CHF exacerbation ([place]) 05/01/2018  . CHF  (congestive heart failure) ([place]) 04/30/2018  . Chronic respiratory failure with hypoxia ([place]) 12/28/2017  . Acute encephalopathy   . Atrial fibrillation with RVR ([place])   . SVT (supraventricular tachycardia) ([place])   . COPD GOLD 0   . Medically noncompliant   . Panlobular emphysema ([place])   . OSA (obstructive sleep apnea)   . Pulmonary hypertension ([place])   . Disorientation   . Pressure injury of skin 09/07/2016  . Acute on chronic combined systolic and diastolic CHF (congestive heart failure) ([place]) 09/05/2016  . Skin lesion-left heal 09/05/2016  . Chronic systolic CHF (congestive heart failure) ([place]) 07/16/2016  . COPD (chronic obstructive pulmonary disease) ([place]) 07/16/2016  . Uncontrolled type 2 diabetes mellitus with complication ([place])   . Diabetic polyneuropathy associated with diabetes mellitus due to underlying condition ([place])   . Normocytic anemia 06/29/2016  . CAD - Non-obstructive by LHC1/16 01/21/2016  . Prolonged QT interval 10/09/2015  . Nonischemic cardiomyopathy ([place]) 10/09/2015  . PAF (paroxysmal atrial fibrillation) ([place])   . Chronic pain 02/19/2015  . Morbid obesity ([place]) 02/13/2015  . Dyspnea   . Elevated troponin I level 11/01/2014  . COPD exacerbation ([place]) 11/01/2014  . Essential hypertension 10/31/2014  . Type 2 diabetes mellitus with neuropathy 10/31/2014  . Hyperlipidemia  10/31/2014  . Cigarette smoker 10/31/2014    Current Outpatient Medications:  .  albuterol (VENTOLIN HFA) 108 (90 Base) MCG/ACT inhaler, Inhale 2 puffs into the lungs every 6 (six) hours as needed for wheezing or shortness of breath., Disp: , Rfl:  .  apixaban (ELIQUIS) 5 MG TABS tablet, Take 1 tablet (  5 mg total) by mouth 2 (two) times daily., Disp: 60 tablet, Rfl: 0 .  aspirin EC 81 MG EC tablet, Take 1 tablet (81 mg total) by mouth daily., Disp: 30 tablet, Rfl: 0 .  budesonide-formoterol (SYMBICORT) 160-4.5 MCG/ACT inhaler, Inhale 2 puffs into the lungs as needed (shortness of breath).  , Disp: , Rfl:  .  ferrous sulfate 325 (65 FE) MG tablet, Take 325 mg by mouth 2 (two) times daily., Disp: , Rfl:  .  fluticasone (FLONASE) 50 MCG/ACT nasal spray, Place 2 sprays into both nostrils daily as needed for allergies. , Disp: , Rfl:  .  gabapentin (NEURONTIN) 300 MG capsule, Take 1 capsule (300 mg total) by mouth 2 (two) times daily., Disp: 60 capsule, Rfl: 0 .  hydrocortisone (ANUSOL-HC) 2.5 % rectal cream, Place 1 application rectally 2 (two) times daily as needed for hemorrhoids or itching. , Disp: , Rfl:  .  hydrocortisone 1 % lotion, Apply 1 application topically See admin instructions. Apply small amount to back twice daily for itchy rash as needed, Disp: , Rfl:  .  insulin aspart (NOVOLOG) 100 UNIT/ML injection, Inject 4 Units into the skin 3 (three) times daily with meals., Disp: 10 mL, Rfl: 0 .  insulin aspart protamine - aspart (NOVOLOG MIX 70/30 FLEXPEN) (70-30) 100 UNIT/ML FlexPen, Inject 0.1 mLs (10 Units total) into the skin 2 (two) times daily., Disp: 15 mL, Rfl: 0 .  ipratropium-albuterol (DUONEB) 0.5-2.5 (3) MG/3ML SOLN, Take 3 mLs by nebulization 2 (two) times daily as needed (shortness of breath/wheezing). , Disp: , Rfl:  .  isosorbide-hydrALAZINE (BIDIL) 20-37.5 MG tablet, Take 0.5 tablets by mouth 3 (three) times daily., Disp: 135 tablet, Rfl: 3 .  metolazone (ZAROXOLYN) 2.5 MG tablet, Take 1 tablet (2.5 mg total) by mouth 4 (four) times a week. Take extra Potassium 12mq when taking this medication (Patient taking differently: Take 2.5 mg by mouth 4 (four) times a week. Take extra Potassium 255m when taking this medication), Disp: 30 tablet, Rfl: 3 .  oxybutynin (DITROPAN) 5 MG tablet, Take 1 tablet (5 mg total) by mouth 4 (four) times daily., Disp: 120 tablet, Rfl: 0 .  oxyCODONE-acetaminophen (PERCOCET) 10-325 MG tablet, Take 1 tablet by mouth every 6 (six) hours as needed for pain. , Disp: , Rfl:  .  OXYGEN, Inhale 3 L into the lungs continuous. , Disp: , Rfl:  .   pantoprazole (PROTONIX) 40 MG tablet, Take 1 tablet (40 mg total) by mouth 2 (two) times daily., Disp: 60 tablet, Rfl: 0 .  polyethylene glycol (MIRALAX / GLYCOLAX) packet, Take 17 g by mouth daily. (Patient taking differently: Take 17 g by mouth daily as needed (constipation). ), Disp: 14 each, Rfl: 0 .  potassium chloride SA (K-DUR) 20 MEQ tablet, Take 3 tablets (60 mEq total) by mouth 2 (two) times daily. With additional 2088mwhen you take Metolazone., Disp: 90 tablet, Rfl: 3 .  predniSONE (DELTASONE) 10 MG tablet, Take 4 tablets (40 mg total) by mouth 2 (two) times daily with a meal for 3 days, THEN 4 tablets (40 mg total) daily with breakfast for 2 days, THEN 2 tablets (20 mg total) daily with breakfast for 2 days., Disp: 36 tablet, Rfl: 0 .  PRESCRIPTION MEDICATION, Cpap, Disp: , Rfl:  .  rosuvastatin (CRESTOR) 20 MG tablet, Take 1 tablet (20 mg total) by mouth daily at 6 PM., Disp: 90 tablet, Rfl: 3 .  sodium chloride (OCEAN) 0.65 % SOLN nasal spray, Place 2 sprays  into both nostrils as needed for congestion. , Disp: , Rfl:  .  tamsulosin (FLOMAX) 0.4 MG CAPS capsule, Take 1 capsule (0.4 mg total) by mouth daily after supper., Disp: 30 capsule, Rfl: 3 .  torsemide (DEMADEX) 20 MG tablet, Take 4 tablets (80 mg total) by mouth 2 (two) times daily., Disp: 180 tablet, Rfl: 3 .  carvedilol (COREG) 3.125 MG tablet, Take 1 tablet (3.125 mg total) by mouth 2 (two) times daily with a meal. (Patient not taking: Reported on 07/27/2019), Disp: 60 tablet, Rfl: 0 No Known Allergies    Social History   Socioeconomic History  . Marital status: Married    Spouse name: Not on file  . Number of children: Not on file  . Years of education: Not on file  . Highest education level: Not on file  Occupational History  . Not on file  Social Needs  . Financial resource strain: Not on file  . Food insecurity    Worry: Not on file    Inability: Not on file  . Transportation needs    Medical: Not on file     Non-medical: Not on file  Tobacco Use  . Smoking status: Former Smoker    Packs/day: 1.00    Years: 34.00    Pack years: 34.00    Types: Cigarettes    Quit date: 11/30/2018    Years since quitting: 0.6  . Smokeless tobacco: Never Used  Substance and Sexual Activity  . Alcohol use: No    Alcohol/week: 0.0 standard drinks  . Drug use: No  . Sexual activity: Not on file  Lifestyle  . Physical activity    Days per week: Not on file    Minutes per session: Not on file  . Stress: Not on file  Relationships  . Social Herbalist on phone: Not on file    Gets together: Not on file    Attends religious service: Not on file    Active member of club or organization: Not on file    Attends meetings of clubs or organizations: Not on file    Relationship status: Not on file  . Intimate partner violence    Fear of current or ex partner: Not on file    Emotionally abused: Not on file    Physically abused: Not on file    Forced sexual activity: Not on file  Other Topics Concern  . Not on file  Social History Narrative  . Not on file    Physical Exam      Future Appointments  Date Time Provider [place]  08/02/2019  1:30 PM [facility] PA/NP [facility] None    BP 110/60   Pulse 66   Temp 97.7 F (36.5 C)   Resp 20   Wt 253 lb (114.8 kg)   SpO2 91%   BMI 34.31 kg/m   Weight yesterday-252 Last visit weight-?  Pt recently home from hosp after a long admission stay.  Pt states he is feeling better.  Carvedilol was added back to his regimen.  He does not have that at home. His wife had out a 11m tab--she did not get the 3.125  from pharmacy. He has been taking the 277mdose past few days. He states he has felt dizzy the past 2 mornings. So hopefully since that was caught he wont feel that way again. [place] [facility] [place].  Just started the prednisone dosing today- will complete on  Tuesday 9/29.  Metolazone-on mon/wed/fri/sat.  The potassium  will be given to him by wife as they wont fit in his pill box-I labeled the days that he needs to take extra potassium on the metolazone days.   Home health will come out on Saturday.   Marylouise Stacks, [facility] Paramedic  07/27/19

## 2019-08-02 ENCOUNTER — Encounter (HOSPITAL_COMMUNITY): Payer: Self-pay

## 2019-08-02 ENCOUNTER — Ambulatory Visit (HOSPITAL_COMMUNITY)
Admission: RE | Admit: 2019-08-02 | Discharge: 2019-08-02 | Disposition: A | Discharge status: Home / Self Care | Payer: Medicare Other | Source: Ambulatory Visit | Attending: Cardiology | Admitting: Cardiology

## 2019-08-02 ENCOUNTER — Other Ambulatory Visit: Payer: Self-pay

## 2019-08-02 ENCOUNTER — Other Ambulatory Visit (HOSPITAL_COMMUNITY): Payer: Self-pay

## 2019-08-02 VITALS — BP 132/70 | HR 110

## 2019-08-02 DIAGNOSIS — R339 Retention of urine, unspecified: Secondary | ICD-10-CM | POA: Insufficient documentation

## 2019-08-02 DIAGNOSIS — Z8673 Personal history of transient ischemic attack (TIA), and cerebral infarction without residual deficits: Secondary | ICD-10-CM | POA: Insufficient documentation

## 2019-08-02 DIAGNOSIS — Z7982 Long term (current) use of aspirin: Secondary | ICD-10-CM | POA: Insufficient documentation

## 2019-08-02 DIAGNOSIS — I5022 Chronic systolic (congestive) heart failure: Secondary | ICD-10-CM | POA: Diagnosis not present

## 2019-08-02 DIAGNOSIS — I428 Other cardiomyopathies: Secondary | ICD-10-CM | POA: Insufficient documentation

## 2019-08-02 DIAGNOSIS — I272 Pulmonary hypertension, unspecified: Secondary | ICD-10-CM | POA: Diagnosis not present

## 2019-08-02 DIAGNOSIS — Z87891 Personal history of nicotine dependence: Secondary | ICD-10-CM | POA: Insufficient documentation

## 2019-08-02 DIAGNOSIS — E1122 Type 2 diabetes mellitus with diabetic chronic kidney disease: Secondary | ICD-10-CM | POA: Diagnosis not present

## 2019-08-02 DIAGNOSIS — I5042 Chronic combined systolic (congestive) and diastolic (congestive) heart failure: Secondary | ICD-10-CM | POA: Diagnosis not present

## 2019-08-02 DIAGNOSIS — R262 Difficulty in walking, not elsewhere classified: Secondary | ICD-10-CM | POA: Diagnosis not present

## 2019-08-02 DIAGNOSIS — E785 Hyperlipidemia, unspecified: Secondary | ICD-10-CM | POA: Diagnosis not present

## 2019-08-02 DIAGNOSIS — E7800 Pure hypercholesterolemia, unspecified: Secondary | ICD-10-CM | POA: Insufficient documentation

## 2019-08-02 DIAGNOSIS — J9610 Chronic respiratory failure, unspecified whether with hypoxia or hypercapnia: Secondary | ICD-10-CM | POA: Diagnosis not present

## 2019-08-02 DIAGNOSIS — R0600 Dyspnea, unspecified: Secondary | ICD-10-CM

## 2019-08-02 DIAGNOSIS — J90 Pleural effusion, not elsewhere classified: Secondary | ICD-10-CM | POA: Insufficient documentation

## 2019-08-02 DIAGNOSIS — I130 Hypertensive heart and chronic kidney disease with heart failure and stage 1 through stage 4 chronic kidney disease, or unspecified chronic kidney disease: Secondary | ICD-10-CM | POA: Insufficient documentation

## 2019-08-02 DIAGNOSIS — I480 Paroxysmal atrial fibrillation: Secondary | ICD-10-CM | POA: Insufficient documentation

## 2019-08-02 DIAGNOSIS — I2510 Atherosclerotic heart disease of native coronary artery without angina pectoris: Secondary | ICD-10-CM | POA: Diagnosis not present

## 2019-08-02 DIAGNOSIS — Z7901 Long term (current) use of anticoagulants: Secondary | ICD-10-CM | POA: Diagnosis not present

## 2019-08-02 DIAGNOSIS — Z9981 Dependence on supplemental oxygen: Secondary | ICD-10-CM | POA: Diagnosis not present

## 2019-08-02 DIAGNOSIS — G4733 Obstructive sleep apnea (adult) (pediatric): Secondary | ICD-10-CM | POA: Insufficient documentation

## 2019-08-02 DIAGNOSIS — I4892 Unspecified atrial flutter: Secondary | ICD-10-CM | POA: Diagnosis not present

## 2019-08-02 DIAGNOSIS — Z9119 Patient's noncompliance with other medical treatment and regimen: Secondary | ICD-10-CM | POA: Insufficient documentation

## 2019-08-02 DIAGNOSIS — Z79899 Other long term (current) drug therapy: Secondary | ICD-10-CM | POA: Insufficient documentation

## 2019-08-02 DIAGNOSIS — N183 Chronic kidney disease, stage 3 (moderate): Secondary | ICD-10-CM | POA: Insufficient documentation

## 2019-08-02 DIAGNOSIS — J441 Chronic obstructive pulmonary disease with (acute) exacerbation: Secondary | ICD-10-CM | POA: Diagnosis not present

## 2019-08-02 DIAGNOSIS — Z794 Long term (current) use of insulin: Secondary | ICD-10-CM | POA: Diagnosis not present

## 2019-08-02 DIAGNOSIS — Z8249 Family history of ischemic heart disease and other diseases of the circulatory system: Secondary | ICD-10-CM | POA: Insufficient documentation

## 2019-08-02 LAB — BASIC METABOLIC PANEL
Anion gap: 16 — ABNORMAL HIGH (ref 5–15)
BUN: 60 mg/dL — ABNORMAL HIGH (ref 8–23)
CO2: 35 mmol/L — ABNORMAL HIGH (ref 22–32)
Calcium: 8.1 mg/dL — ABNORMAL LOW (ref 8.9–10.3)
Chloride: 83 mmol/L — ABNORMAL LOW (ref 98–111)
Creatinine, Ser: 2.14 mg/dL — ABNORMAL HIGH (ref 0.61–1.24)
GFR calc Af Amer: 35 mL/min — ABNORMAL LOW (ref >60)
GFR calc non Af Amer: 30 mL/min — ABNORMAL LOW (ref >60)
Glucose, Bld: 362 mg/dL — ABNORMAL HIGH (ref 70–99)
Potassium: 3.4 mmol/L — ABNORMAL LOW (ref 3.5–5.1)
Sodium: 134 mmol/L — ABNORMAL LOW (ref 135–145)

## 2019-08-02 LAB — BRAIN NATRIURETIC PEPTIDE: B Natriuretic Peptide: 2064.2 pg/mL — ABNORMAL HIGH (ref 0.0–100.0)

## 2019-08-02 LAB — CBC
HCT: 30 % — ABNORMAL LOW (ref 39.0–52.0)
Hemoglobin: 9.3 g/dL — ABNORMAL LOW (ref 13.0–17.0)
MCH: 26.6 pg (ref 26.0–34.0)
MCHC: 31 g/dL (ref 30.0–36.0)
MCV: 86 fL (ref 80.0–100.0)
Platelets: 344 10*3/uL (ref 150–400)
RBC: 3.49 MIL/uL — ABNORMAL LOW (ref 4.22–5.81)
RDW: 19.5 % — ABNORMAL HIGH (ref 11.5–15.5)
WBC: 11.1 10*3/uL — ABNORMAL HIGH (ref 4.0–10.5)
nRBC: 2.9 % — ABNORMAL HIGH (ref 0.0–0.2)

## 2019-08-02 MED ORDER — BISOPROLOL FUMARATE 5 MG PO TABS: 5.0000 mg | ORAL_TABLET | Freq: Every day | ORAL | 3 refills | Status: DC

## 2019-08-02 NOTE — Addendum Note (Signed)
Encounter addended by: Marlise Eves, RN on: 08/02/2019 3:26 PM  Actions taken: Order list changed

## 2019-08-02 NOTE — Progress Notes (Signed)
Letter for dccv 08/09/19 given to pt and wife at NP appt.

## 2019-08-02 NOTE — Progress Notes (Signed)
Came out today for med rec post clinic visit- He has missed a couple doses of his meds for 3 days in a row. Wife reports if its later timing with his dosing of meds she will skip it and move on to the next dose.   meds verified and pill box refilled.   Marylouise Stacks, EMT-Paramedic  08/02/19

## 2019-08-02 NOTE — Progress Notes (Signed)
Paramedicine Encounter   Patient ID: Nicholas Caldwell , male,   DOB: 03-14-1946,73 y.o.,  MRN: [id number]   Met patient in clinic today with provider.  P-110 [id number] BP-132/70  Unable to weigh-legs wrapped  Weights at home this week between 1-2 lb difference--he cant remember exact numbers.  Pt went to bathroom when he got here and just him doing that and back to wheelchair he was very sob with audible wheezing. After a few min of rest his breathing returned to more normal and wheezing diminished.  HR was increased when he first got here. EKG was ordered. He is back in afib- Amy is going to restart him back on bisoprolol 2m at bedtime and stop the carvedilol.  He will need to be cardioverted.  Labs done today. I will go out after while and fill pill box.   KMarylouise Stacks EAm[place]9/29/2020

## 2019-08-02 NOTE — Patient Instructions (Addendum)
Lab work done today. We will notify you of any abnormal lab work. No news is good news!  STOP Carvedilol  START Bisoprolol 10m daily at bedtime.  Your physician has recommended that you have a Cardioversion (DCCV). Electrical Cardioversion uses a jolt of electricity to your heart either through paddles or wired patches attached to your chest. This is a controlled, usually prescheduled, procedure. Defibrillation is done under light anesthesia in the hospital, and you usually go home the day of the procedure. This is done to get your heart back into a normal rhythm. You are not awake for the procedure. Please see the instruction sheet given to you today.   Please follow up with the [facility] in 4 weeks.   At the [facility] you and your health needs are our priority. As part of our continuing mission to provide you with exceptional heart care, we have created designated Provider Care Teams. These Care Teams include your primary Cardiologist (physician) and Advanced Practice Providers (APPs- Physician Assistants and Nurse Practitioners) who all work together to provide you with the care you need, when you need it.   You may see any of the following providers on your designated Care Team at your next follow up: .Marland KitchenDr DGlori Bickers. Dr DLoralie Champagne. ADarrick Grinder NP   Please be sure to bring in all your medications bottles to every appointment.

## 2019-08-02 NOTE — Progress Notes (Signed)
Heart Failure  Note Date:  08/02/2019   ID:  Nicholas Cal[facility], DOB 1946/10/16, MRN [id number]  Location: Home  Provider location: [facility] Type of Visit: Established patient  PCP:  [facility]  Cardiologist:  No primary care provider on file. Primary HF: Dr [name]   Chief Complaint: Heart Failure   History of Present Illness: Nicholas Caldwell is a 73 y.o. male with a history of chronic systolic CHFdue to NICM (EF 40-45%)with moderate RV failure, COPD on 3 L O2, DM, HTN, OHS/OSA on CPAP, PAF s/p DCCV, hyperlipidemia, CAD, and noncompliance.He has multiple admissions over past year due to HF and respiratory failure.   Most recently admitted from 03/07/19-03/31/19 with massive volume overload with severe RV failure, AFL and cardiorenal syndrome. Attempts at diuresis limited by worsening creatinine which peaked at 2.6. Treated with milrinone but still unable to get CVP below 15. Hospital course c/b severe non-compliance with high fluid intake, PAFL, penile wound ,acute versus subacute CVA and Citrobacter UTII. Underwent DC-CV of AFL into NSR.  D/c'd home on 5/28 with weight of 270 and creatinine 1.99. Readmitted6/6/2020with recurrent hypoxic/hypercarbic respiratory failure. (ABG 7.27/66/55/86%). Found to have severe hypekalemia (K>7.5) and large left pleural effusion. Weightwasup about 10 pounds since d/c.Diuresed with IV lasix and later transitioned to torsemide 80 mg twice a day.Per Dr [name] he was not a candidate for inotropes. Discharge weight on 04/14/2019 was 251 pounds.   Admitted 01/02/66 with A/C systolic/diastolic heart failure. Bisoprolol was stopped on admit and not continued on discharge. Diuresed with with IV lasix and transitioned back to torsemide 80 mg twice a day + metolazone 4 times a week.   Admitted 11/05/43 with A/C systolic/diastolic heart failure  + AECOPD. Diuresed with IV lasix and discharged on torsemide 80 mg twice a day + metolazone  4x a week. Treated with steroids and antibiotics. He was placed on low dose carvedilol.  Palliative Care consulted. MOST form was introduced. He wished to continue full code.   Today he returns for HF follow up.Overall feeling fair.  He remains SOB with exertion. On 3 liters [place]. Denies PND/Orthopnea. Having a hard time walking around but this is his baseline.  Uses wheel chair outside his home. Appetite ok. No fever or chills.  Taking all medications. He has not missed any doses of eliquis.  No bleeding issues. Followed by HF Paramedicine.     ECHO 07/2019   1.The left ventricle has hyperdynamic systolic function, with an ejection fraction of >65%. The cavity size was normal. There is mildly increased left ventricular wall thickness. Left ventricular diastolic Doppler parameters are indeterminate. No  evidence of left ventricular regional wall motion abnormalities.  2. The right ventricle has mildly reduced systolic function. The cavity was moderately enlarged. There is not assessed.  3. Left atrial size was mildly dilated.  4. Right atrial size was moderately dilated.  5. The aortic valve is tricuspid. No stenosis of the aortic valve.  6. The aorta is not well visualized unless otherwise noted.  7. The inferior vena cava was dilated in size with <50% respiratory variability.  8. The interatrial septum was not well visualized.  Past Medical History:  Diagnosis Date   Atrial flutter ([place])    a. recurrent AFlutter with RVR;  b. Amiodarone Rx started 4/16   CAD (coronary artery disease)    a. LHC 1/16:  mLAD diffuse disease, pLCx mild disease, dLCx with disease but too small for PCI, RCA ok,  EF 25-30%   Chronic pain    Chronic systolic CHF (congestive heart failure) (HCC)    COPD (chronic obstructive pulmonary disease) (HCC)    Diabetes mellitus without complication (HCC)    Hypercholesteremia    Hypertension    NICM (nonischemic cardiomyopathy) ([place])    a.dx 2016. b. 2D echo  06/2016 - Last echo 07/01/16: mod dilated LV, mod LVH, EF 25-30%, mild-mod MR, sev LAE, mild-mod reduced RV systolic function, mild-mod TR, PASP 41mHG.   PAF (paroxysmal atrial fibrillation) (HCC)    On amio - ot a candidate for flecainide due to cardiomyopathy, not a candidate for Tikosyn due to prolonged QT, and felt to be a poor candidate for ablation given left atrial size.   Pulmonary hypertension ([facility]    Tobacco abuse    Past Surgical History:  Procedure Laterality Date   BIOPSY  03/11/2019   Procedure: BIOPSY;  Surgeon: CLavena Bullion DO;  [facility]  Service: Gastroenterology;;   BIOPSY  03/15/2019   Procedure: BIOPSY;  Surgeon: MIrving Copas, MD;  [facility]  Service: Gastroenterology;;   CARDIOVERSION N/A 07/30/2018   Procedure: CARDIOVERSION;  Surgeon: MLarey Dresser MD;  [facility];  Service: Cardiovascular;  Laterality: N/A;   COLONOSCOPY N/A 03/11/2019   Procedure: COLONOSCOPY;  Surgeon: CLavena Bullion DO;  [facility]  Service: Gastroenterology;  Laterality: N/A;   ENTEROSCOPY N/A 03/15/2019   Procedure: ENTEROSCOPY;  Surgeon: MRush LandmarkGTelford Nab, MD;  [facility]  Service: [facility]ology;  Laterality: N/A;   ESOPHAGOGASTRODUODENOSCOPY Left 03/11/2019   Procedure: ESOPHAGOGASTRODUODENOSCOPY (EGD);  Surgeon: CLavena Bullion DO;  [facility]  Service: Gastroenterology;  Laterality: Left;   LEFT AND RIGHT HEART CATHETERIZATION WITH CORONARY ANGIOGRAM N/A 11/06/2014   Procedure: LEFT AND RIGHT HEART CATHETERIZATION WITH CORONARY ANGIOGRAM;  Surgeon: JJettie Booze MD;  [facility];  Service: Cardiovascular;  Laterality: N/A;   RIGHT/LEFT HEART CATH AND CORONARY ANGIOGRAPHY N/A 12/06/2018   Procedure: RIGHT/LEFT HEART CATH AND CORONARY ANGIOGRAPHY;  Surgeon: MLarey Dresser MD;  [facility];  Service: Cardiovascular;  Laterality: N/A;    SPLENECTOMY     SUBMUCOSAL TATTOO INJECTION  03/15/2019   Procedure: SUBMUCOSAL TATTOO INJECTION;  Surgeon: MIrving Copas, MD;  [facility]  Service: Gastroenterology;;     Current Outpatient Medications  Medication Sig Dispense Refill   albuterol (VENTOLIN HFA) 108 (90 Base) MCG/ACT inhaler Inhale 2 puffs into the lungs every 6 (six) hours as needed for wheezing or shortness of breath.     apixaban (ELIQUIS) 5 MG TABS tablet Take 1 tablet (5 mg total) by mouth 2 (two) times daily. 60 tablet 0   aspirin EC 81 MG EC tablet Take 1 tablet (81 mg total) by mouth daily. 30 tablet 0   budesonide-formoterol (SYMBICORT) 160-4.5 MCG/ACT inhaler Inhale 2 puffs into the lungs as needed (shortness of breath).      carvedilol (COREG) 3.125 MG tablet Take 1 tablet (3.125 mg total) by mouth 2 (two) times daily with a meal. 60 tablet 0   ferrous sulfate 325 (65 FE) MG tablet Take 325 mg by mouth 2 (two) times daily.     fluticasone (FLONASE) 50 MCG/ACT nasal spray Place 2 sprays into both nostrils daily as needed for allergies.      gabapentin (NEURONTIN) 300 MG capsule Take 1 capsule (300 mg total) by mouth 2 (two) times daily. 60 capsule 0   hydrocortisone (ANUSOL-HC) 2.5 %  rectal cream Place 1 application rectally 2 (two) times daily as needed for hemorrhoids or itching.      hydrocortisone 1 % lotion Apply 1 application topically See admin instructions. Apply small amount to back twice daily for itchy rash as needed     insulin aspart (NOVOLOG) 100 UNIT/ML injection Inject 4 Units into the skin 3 (three) times daily with meals. 10 mL 0   insulin aspart protamine - aspart (NOVOLOG MIX 70/30 FLEXPEN) (70-30) 100 UNIT/ML FlexPen Inject 0.1 mLs (10 Units total) into the skin 2 (two) times daily. 15 mL 0   ipratropium-albuterol (DUONEB) 0.5-2.5 (3) MG/3ML SOLN Take 3 mLs by nebulization 2 (two) times daily as needed (shortness of breath/wheezing).      isosorbide-hydrALAZINE  (BIDIL) 20-37.5 MG tablet Take 0.5 tablets by mouth 3 (three) times daily. 135 tablet 3   metolazone (ZAROXOLYN) 2.5 MG tablet Take 1 tablet (2.5 mg total) by mouth 4 (four) times a week. Take extra Potassium 4mq when taking this medication 30 tablet 3   oxybutynin (DITROPAN) 5 MG tablet Take 1 tablet (5 mg total) by mouth 4 (four) times daily. 120 tablet 0   oxyCODONE-acetaminophen (PERCOCET) 10-325 MG tablet Take 1 tablet by mouth every 6 (six) hours as needed for pain.      OXYGEN Inhale 3 L into the lungs continuous.      pantoprazole (PROTONIX) 40 MG tablet Take 1 tablet (40 mg total) by mouth 2 (two) times daily. 60 tablet 0   polyethylene glycol (MIRALAX / GLYCOLAX) packet Take 17 g by mouth daily. (Patient taking differently: Take 17 g by mouth daily as needed (constipation). ) 14 each 0   potassium chloride SA (K-DUR) 20 MEQ tablet Take 3 tablets (60 mEq total) by mouth 2 (two) times daily. With additional 217m when you take Metolazone. 90 tablet 3   PRESCRIPTION MEDICATION Cpap     rosuvastatin (CRESTOR) 20 MG tablet Take 1 tablet (20 mg total) by mouth daily at 6 PM. 90 tablet 3   sodium chloride (OCEAN) 0.65 % SOLN nasal spray Place 2 sprays into both nostrils as needed for congestion.      tamsulosin (FLOMAX) 0.4 MG CAPS capsule Take 1 capsule (0.4 mg total) by mouth daily after supper. 30 capsule 3   torsemide (DEMADEX) 20 MG tablet Take 4 tablets (80 mg total) by mouth 2 (two) times daily. 180 tablet 3   No current facility-administered medications for this encounter.     Allergies:   Patient has no known allergies.   Social History:  The patient  reports that he quit smoking about 8 months ago. His smoking use included cigarettes. He has a 34.00 pack-year smoking history. He has never used smokeless tobacco. He reports that he does not drink alcohol or use drugs.   Family History:  The patient's family history includes Heart disease in his father and mother; Heart  failure in his mother; Hypertension in his mother and sister.   ROS:  Please see the history of present illness.   All other systems are personally reviewed and negative.  Wt Readings from Last 3 Encounters:  07/27/19 114.8 kg (253 lb)  07/22/19 120 kg (264 lb 8.8 oz)  06/13/19 116.4 kg (256 lb 9.6 oz)   Vitals:   08/02/19 1409  BP: 132/70  Pulse: (!) 110  SpO2: 98%    Exam:   General:  Appears chronically. Arrived in a wheel chair on 3 liters [place].  HEENT: normal Neck: supple.  JVP 6-7 . Carotids 2+ bilat; no bruits. No lymphadenopathy or thryomegaly appreciated. Cor: PMI nondisplaced. Irregular rate & rhythm. No rubs, gallops or murmurs. Lungs: EW throughout. On 3 liters [place] Abdomen: obese, soft, nontender, nondistended. No hepatosplenomegaly. No bruits or masses. Good bowel sounds. Extremities: no cyanosis, clubbing, rash, R and LLE 1+ edema with wraps in place.  Neuro: alert & orientedx3, cranial nerves grossly intact. moves all 4 extremities w/o difficulty. Affect pleasant   EKG: A fib 90 bpm  Recent Labs: 04/10/2019: TSH 0.587 07/08/2019: ALT 10 07/18/2019: B Natriuretic Peptide 2,833.9 07/22/2019: BUN 45; Creatinine, Ser 1.69; Hemoglobin 8.6; Magnesium 2.7; Platelets 344; Potassium 3.5; Sodium 138  Personally reviewed   Wt Readings from Last 3 Encounters:  07/27/19 114.8 kg (253 lb)  07/22/19 120 kg (264 lb 8.8 oz)  06/13/19 116.4 kg (256 lb 9.6 oz)      ASSESSMENT AND PLAN:  1.Biventricular HF: Nonischemic cardiomyopathy.With prominent RV failure, likely related to COPD/OSA/OHS. TEE in 3/20 with EF 40-45%, severely dilated RV with moderately decreased systolic function. ECHO >65%. RV mildy reduced.   He has had multiple recent admissions with CHF and COPD exacerbations.In the past he was placed on inotropes but per Dr [name] would NOT re-attempt inotropic support as it did not help much and he is not candidate for home inotropes.  -NYHA IIIB. Volume status stable.  Continue torsemide 80 mg twice a day and continue metolazone 4 times a week.  - Continue Bidil 0.5 tab tid  - Off spironolactone with hyyperkalemia.  - Discussed referral to Hospice versus Palliative Care to discuss goals of care. He is currently not interested.  2. Anemia: GI workup, colonoscopy 5/8 unremarkable but EGD showed necrotic mucosa gastric fundus/body that is concerning for gastric cancer, biopsies taken which were negative for malignancy.  Followed by GI.  3. Atrial fibrillation/flutter: .  Was seen in past by Dr. Rayann Heman, not thought to be good ablation candidate. He has not been on Tikosyn or amiodarone due to history of long QT.  - back in A fib today. Last in NSR in August. Discussed with Dr [name]. Will set up for cardioversion. He has not missed any doses on eliquis.  - Continue Eliquis.  4. COPD: Severe, on 3L home oxygen.  - On home oxygen 3 liters oxygen.Wheezing today.  5. OHS/OSA: On home oxygen. Needs to use CPAP every night. .  6. CAD: Nonobstructive on prior cath. No s/s ischemia. He is on Crestor. Off ASA with DOAC and GI bleed  7 Chronic kidney disease stage III: Baseline seems to be 1.3-1.6.  Check  BMET today.  8. . DM2 with poor control Per PCP  9. CVA: 03/2019, MRI head showed both small acute/subacute and old CVAs. Neuro has seen, suspect related to atrial fibrillation.  - He is on Eliquis. 10. Urinary Retention- needs follow up at the [place].  Continue Flomax   Follow up in 4 weeks. Set up for cardioversion today. Check labs.    Jeanmarie Hubert, NP  08/02/2019 2:09 PM  [place] [address] [facility] [place] [address] [phone] (office) [phone] (fax)

## 2019-08-03 ENCOUNTER — Telehealth (HOSPITAL_COMMUNITY): Payer: Self-pay | Admitting: *Deleted

## 2019-08-03 NOTE — Telephone Encounter (Signed)
Angel Routh,RN w/[facility] called to report patients heart rate 120-170's, wheezing is worse, and his O2 sat is 84% on 3L of O2. Patient does not feel well DCCV was scheduled for 10/6 but patient has declined since yesterdays office visit. Per Darrick Grinder, NP patient needs to go to the emergency room.  Angel aware and will communicate with patient.

## 2019-08-05 ENCOUNTER — Other Ambulatory Visit (HOSPITAL_COMMUNITY)
Admission: RE | Admit: 2019-08-05 | Discharge: 2019-08-05 | Disposition: A | Discharge status: Home / Self Care | Payer: Medicare Other | Source: Ambulatory Visit | Attending: Cardiology | Admitting: Cardiology

## 2019-08-05 DIAGNOSIS — Z20828 Contact with and (suspected) exposure to other viral communicable diseases: Secondary | ICD-10-CM | POA: Diagnosis not present

## 2019-08-05 DIAGNOSIS — Z01812 Encounter for preprocedural laboratory examination: Secondary | ICD-10-CM | POA: Diagnosis not present

## 2019-08-07 LAB — NOVEL CORONAVIRUS, NAA (HOSP ORDER, SEND-OUT TO REF LAB; TAT 18-24 HRS): SARS-CoV-2, NAA: NOT DETECTED

## 2019-08-08 ENCOUNTER — Telehealth: Payer: Self-pay | Admitting: *Deleted

## 2019-08-08 NOTE — Telephone Encounter (Signed)
Reviewed negative covid19 results. No questions asked.

## 2019-08-09 ENCOUNTER — Emergency Department (HOSPITAL_COMMUNITY): Payer: No Typology Code available for payment source

## 2019-08-09 ENCOUNTER — Inpatient Hospital Stay (HOSPITAL_COMMUNITY): Payer: No Typology Code available for payment source

## 2019-08-09 ENCOUNTER — Ambulatory Visit (HOSPITAL_COMMUNITY): Admission: RE | Admit: 2019-08-09 | Payer: Medicare Other | Source: Home / Self Care | Admitting: Cardiology

## 2019-08-09 ENCOUNTER — Encounter (HOSPITAL_COMMUNITY): Payer: Self-pay | Admitting: Certified Registered"

## 2019-08-09 ENCOUNTER — Encounter (HOSPITAL_COMMUNITY): Payer: Self-pay | Admitting: Emergency Medicine

## 2019-08-09 ENCOUNTER — Encounter (HOSPITAL_COMMUNITY)
Admission: EM | Disposition: A | Discharge status: Home Health | Payer: Self-pay | Source: Home / Self Care | Attending: Family Medicine

## 2019-08-09 ENCOUNTER — Inpatient Hospital Stay (HOSPITAL_COMMUNITY)
Admission: EM | Admit: 2019-08-09 | Discharge: 2019-08-15 | DRG: 205 | Disposition: A | Discharge status: Home Health | Payer: No Typology Code available for payment source | Attending: Family Medicine | Admitting: Family Medicine

## 2019-08-09 DIAGNOSIS — R0600 Dyspnea, unspecified: Secondary | ICD-10-CM | POA: Diagnosis present

## 2019-08-09 DIAGNOSIS — I5082 Biventricular heart failure: Secondary | ICD-10-CM | POA: Diagnosis present

## 2019-08-09 DIAGNOSIS — J9690 Respiratory failure, unspecified, unspecified whether with hypoxia or hypercapnia: Secondary | ICD-10-CM

## 2019-08-09 DIAGNOSIS — E1165 Type 2 diabetes mellitus with hyperglycemia: Secondary | ICD-10-CM | POA: Diagnosis present

## 2019-08-09 DIAGNOSIS — I130 Hypertensive heart and chronic kidney disease with heart failure and stage 1 through stage 4 chronic kidney disease, or unspecified chronic kidney disease: Secondary | ICD-10-CM | POA: Diagnosis present

## 2019-08-09 DIAGNOSIS — Z20828 Contact with and (suspected) exposure to other viral communicable diseases: Secondary | ICD-10-CM | POA: Diagnosis not present

## 2019-08-09 DIAGNOSIS — F17210 Nicotine dependence, cigarettes, uncomplicated: Secondary | ICD-10-CM | POA: Diagnosis present

## 2019-08-09 DIAGNOSIS — G8929 Other chronic pain: Secondary | ICD-10-CM | POA: Diagnosis present

## 2019-08-09 DIAGNOSIS — J9819 Other pulmonary collapse: Secondary | ICD-10-CM | POA: Diagnosis present

## 2019-08-09 DIAGNOSIS — I10 Essential (primary) hypertension: Secondary | ICD-10-CM | POA: Diagnosis not present

## 2019-08-09 DIAGNOSIS — N184 Chronic kidney disease, stage 4 (severe): Secondary | ICD-10-CM | POA: Diagnosis present

## 2019-08-09 DIAGNOSIS — Z8701 Personal history of pneumonia (recurrent): Secondary | ICD-10-CM

## 2019-08-09 DIAGNOSIS — I428 Other cardiomyopathies: Secondary | ICD-10-CM

## 2019-08-09 DIAGNOSIS — Z7982 Long term (current) use of aspirin: Secondary | ICD-10-CM

## 2019-08-09 DIAGNOSIS — J9622 Acute and chronic respiratory failure with hypercapnia: Secondary | ICD-10-CM | POA: Diagnosis not present

## 2019-08-09 DIAGNOSIS — J181 Lobar pneumonia, unspecified organism: Secondary | ICD-10-CM | POA: Diagnosis present

## 2019-08-09 DIAGNOSIS — G4733 Obstructive sleep apnea (adult) (pediatric): Secondary | ICD-10-CM | POA: Diagnosis present

## 2019-08-09 DIAGNOSIS — N179 Acute kidney failure, unspecified: Secondary | ICD-10-CM | POA: Diagnosis present

## 2019-08-09 DIAGNOSIS — E785 Hyperlipidemia, unspecified: Secondary | ICD-10-CM | POA: Diagnosis present

## 2019-08-09 DIAGNOSIS — E611 Iron deficiency: Secondary | ICD-10-CM | POA: Diagnosis present

## 2019-08-09 DIAGNOSIS — I2729 Other secondary pulmonary hypertension: Secondary | ICD-10-CM | POA: Diagnosis present

## 2019-08-09 DIAGNOSIS — R0602 Shortness of breath: Secondary | ICD-10-CM | POA: Diagnosis not present

## 2019-08-09 DIAGNOSIS — J9602 Acute respiratory failure with hypercapnia: Secondary | ICD-10-CM | POA: Diagnosis not present

## 2019-08-09 DIAGNOSIS — Z8249 Family history of ischemic heart disease and other diseases of the circulatory system: Secondary | ICD-10-CM

## 2019-08-09 DIAGNOSIS — Z23 Encounter for immunization: Secondary | ICD-10-CM | POA: Diagnosis not present

## 2019-08-09 DIAGNOSIS — E876 Hypokalemia: Secondary | ICD-10-CM | POA: Diagnosis not present

## 2019-08-09 DIAGNOSIS — J9621 Acute and chronic respiratory failure with hypoxia: Secondary | ICD-10-CM | POA: Diagnosis present

## 2019-08-09 DIAGNOSIS — I2510 Atherosclerotic heart disease of native coronary artery without angina pectoris: Secondary | ICD-10-CM | POA: Diagnosis not present

## 2019-08-09 DIAGNOSIS — Z79891 Long term (current) use of opiate analgesic: Secondary | ICD-10-CM

## 2019-08-09 DIAGNOSIS — J90 Pleural effusion, not elsewhere classified: Secondary | ICD-10-CM

## 2019-08-09 DIAGNOSIS — E1122 Type 2 diabetes mellitus with diabetic chronic kidney disease: Secondary | ICD-10-CM | POA: Diagnosis present

## 2019-08-09 DIAGNOSIS — I272 Pulmonary hypertension, unspecified: Secondary | ICD-10-CM | POA: Diagnosis not present

## 2019-08-09 DIAGNOSIS — T502X5A Adverse effect of carbonic-anhydrase inhibitors, benzothiadiazides and other diuretics, initial encounter: Secondary | ICD-10-CM | POA: Diagnosis present

## 2019-08-09 DIAGNOSIS — E1140 Type 2 diabetes mellitus with diabetic neuropathy, unspecified: Secondary | ICD-10-CM | POA: Diagnosis not present

## 2019-08-09 DIAGNOSIS — J9601 Acute respiratory failure with hypoxia: Secondary | ICD-10-CM | POA: Diagnosis present

## 2019-08-09 DIAGNOSIS — I5043 Acute on chronic combined systolic (congestive) and diastolic (congestive) heart failure: Secondary | ICD-10-CM | POA: Diagnosis present

## 2019-08-09 DIAGNOSIS — L89311 Pressure ulcer of right buttock, stage 1: Secondary | ICD-10-CM | POA: Diagnosis present

## 2019-08-09 DIAGNOSIS — Z7951 Long term (current) use of inhaled steroids: Secondary | ICD-10-CM

## 2019-08-09 DIAGNOSIS — J431 Panlobular emphysema: Secondary | ICD-10-CM | POA: Diagnosis present

## 2019-08-09 DIAGNOSIS — Z7901 Long term (current) use of anticoagulants: Secondary | ICD-10-CM

## 2019-08-09 DIAGNOSIS — I5022 Chronic systolic (congestive) heart failure: Secondary | ICD-10-CM | POA: Diagnosis not present

## 2019-08-09 DIAGNOSIS — I255 Ischemic cardiomyopathy: Secondary | ICD-10-CM | POA: Diagnosis present

## 2019-08-09 DIAGNOSIS — D6489 Other specified anemias: Secondary | ICD-10-CM | POA: Diagnosis present

## 2019-08-09 DIAGNOSIS — J441 Chronic obstructive pulmonary disease with (acute) exacerbation: Secondary | ICD-10-CM | POA: Diagnosis not present

## 2019-08-09 DIAGNOSIS — T380X5A Adverse effect of glucocorticoids and synthetic analogues, initial encounter: Secondary | ICD-10-CM | POA: Diagnosis present

## 2019-08-09 DIAGNOSIS — I509 Heart failure, unspecified: Secondary | ICD-10-CM | POA: Diagnosis not present

## 2019-08-09 DIAGNOSIS — Z716 Tobacco abuse counseling: Secondary | ICD-10-CM | POA: Diagnosis not present

## 2019-08-09 DIAGNOSIS — Z794 Long term (current) use of insulin: Secondary | ICD-10-CM

## 2019-08-09 DIAGNOSIS — N400 Enlarged prostate without lower urinary tract symptoms: Secondary | ICD-10-CM | POA: Diagnosis present

## 2019-08-09 DIAGNOSIS — E1152 Type 2 diabetes mellitus with diabetic peripheral angiopathy with gangrene: Secondary | ICD-10-CM | POA: Diagnosis present

## 2019-08-09 DIAGNOSIS — I480 Paroxysmal atrial fibrillation: Secondary | ICD-10-CM | POA: Diagnosis not present

## 2019-08-09 DIAGNOSIS — G9341 Metabolic encephalopathy: Secondary | ICD-10-CM | POA: Diagnosis not present

## 2019-08-09 DIAGNOSIS — Z9981 Dependence on supplemental oxygen: Secondary | ICD-10-CM | POA: Diagnosis not present

## 2019-08-09 DIAGNOSIS — R9431 Abnormal electrocardiogram [ECG] [EKG]: Secondary | ICD-10-CM | POA: Diagnosis present

## 2019-08-09 DIAGNOSIS — Z9889 Other specified postprocedural states: Secondary | ICD-10-CM

## 2019-08-09 DIAGNOSIS — D638 Anemia in other chronic diseases classified elsewhere: Secondary | ICD-10-CM | POA: Diagnosis present

## 2019-08-09 LAB — TROPONIN I (HIGH SENSITIVITY)
Troponin I (High Sensitivity): 60 ng/L — ABNORMAL HIGH (ref <18)
Troponin I (High Sensitivity): 61 ng/L — ABNORMAL HIGH (ref <18)

## 2019-08-09 LAB — COMPREHENSIVE METABOLIC PANEL
ALT: 16 U/L (ref 0–44)
AST: 20 U/L (ref 15–41)
Albumin: 3.5 g/dL (ref 3.5–5.0)
Alkaline Phosphatase: 130 U/L — ABNORMAL HIGH (ref 38–126)
Anion gap: 18 — ABNORMAL HIGH (ref 5–15)
BUN: 80 mg/dL — ABNORMAL HIGH (ref 8–23)
CO2: 39 mmol/L — ABNORMAL HIGH (ref 22–32)
Calcium: 8.9 mg/dL (ref 8.9–10.3)
Chloride: 78 mmol/L — ABNORMAL LOW (ref 98–111)
Creatinine, Ser: 2.25 mg/dL — ABNORMAL HIGH (ref 0.61–1.24)
GFR calc Af Amer: 33 mL/min — ABNORMAL LOW (ref >60)
GFR calc non Af Amer: 28 mL/min — ABNORMAL LOW (ref >60)
Glucose, Bld: 84 mg/dL (ref 70–99)
Potassium: 2.5 mmol/L — CL (ref 3.5–5.1)
Sodium: 135 mmol/L (ref 135–145)
Total Bilirubin: 1.2 mg/dL (ref 0.3–1.2)
Total Protein: 6.7 g/dL (ref 6.5–8.1)

## 2019-08-09 LAB — CBC WITH DIFFERENTIAL/PLATELET
Abs Immature Granulocytes: 0.03 10*3/uL (ref 0.00–0.07)
Basophils Absolute: 0 10*3/uL (ref 0.0–0.1)
Basophils Relative: 0 %
Eosinophils Absolute: 0 10*3/uL (ref 0.0–0.5)
Eosinophils Relative: 1 %
HCT: 34.4 % — ABNORMAL LOW (ref 39.0–52.0)
Hemoglobin: 10.6 g/dL — ABNORMAL LOW (ref 13.0–17.0)
Immature Granulocytes: 1 %
Lymphocytes Relative: 7 %
Lymphs Abs: 0.4 10*3/uL — ABNORMAL LOW (ref 0.7–4.0)
MCH: 26.9 pg (ref 26.0–34.0)
MCHC: 30.8 g/dL (ref 30.0–36.0)
MCV: 87.3 fL (ref 80.0–100.0)
Monocytes Absolute: 0.7 10*3/uL (ref 0.1–1.0)
Monocytes Relative: 11 %
Neutro Abs: 4.9 10*3/uL (ref 1.7–7.7)
Neutrophils Relative %: 80 %
Platelets: 213 10*3/uL (ref 150–400)
RBC: 3.94 MIL/uL — ABNORMAL LOW (ref 4.22–5.81)
RDW: 21.8 % — ABNORMAL HIGH (ref 11.5–15.5)
WBC: 6.1 10*3/uL (ref 4.0–10.5)
nRBC: 0.3 % — ABNORMAL HIGH (ref 0.0–0.2)

## 2019-08-09 LAB — BRAIN NATRIURETIC PEPTIDE
B Natriuretic Peptide: 2698.3 pg/mL — ABNORMAL HIGH (ref 0.0–100.0)
B Natriuretic Peptide: 2778.5 pg/mL — ABNORMAL HIGH (ref 0.0–100.0)

## 2019-08-09 LAB — POCT I-STAT EG7
Acid-Base Excess: 20 mmol/L — ABNORMAL HIGH (ref 0.0–2.0)
Bicarbonate: 50.4 mmol/L — ABNORMAL HIGH (ref 20.0–28.0)
Calcium, Ion: 0.99 mmol/L — ABNORMAL LOW (ref 1.15–1.40)
HCT: 39 % (ref 39.0–52.0)
Hemoglobin: 13.3 g/dL (ref 13.0–17.0)
O2 Saturation: 57 %
Potassium: 4 mmol/L (ref 3.5–5.1)
Sodium: 132 mmol/L — ABNORMAL LOW (ref 135–145)
TCO2: >50 mmol/L — ABNORMAL HIGH (ref 22–32)
pCO2, Ven: 87.9 mmHg (ref 44.0–60.0)
pH, Ven: 7.366 (ref 7.250–7.430)
pO2, Ven: 34 mmHg (ref 32.0–45.0)

## 2019-08-09 LAB — FIBRINOGEN: Fibrinogen: 412 mg/dL (ref 210–475)

## 2019-08-09 LAB — CBC
HCT: 34.2 % — ABNORMAL LOW (ref 39.0–52.0)
Hemoglobin: 10.4 g/dL — ABNORMAL LOW (ref 13.0–17.0)
MCH: 26.9 pg (ref 26.0–34.0)
MCHC: 30.4 g/dL (ref 30.0–36.0)
MCV: 88.4 fL (ref 80.0–100.0)
Platelets: 203 K/uL (ref 150–400)
RBC: 3.87 MIL/uL — ABNORMAL LOW (ref 4.22–5.81)
RDW: 21.7 % — ABNORMAL HIGH (ref 11.5–15.5)
WBC: 6.5 K/uL (ref 4.0–10.5)
nRBC: 0.3 % — ABNORMAL HIGH (ref 0.0–0.2)

## 2019-08-09 LAB — FERRITIN: Ferritin: 35 ng/mL (ref 24–336)

## 2019-08-09 LAB — TRIGLYCERIDES: Triglycerides: 97 mg/dL (ref <150)

## 2019-08-09 LAB — PROCALCITONIN: Procalcitonin: 0.47 ng/mL

## 2019-08-09 LAB — HEMOGLOBIN A1C
Hgb A1c MFr Bld: 7.2 % — ABNORMAL HIGH (ref 4.8–5.6)
Mean Plasma Glucose: 159.94 mg/dL

## 2019-08-09 LAB — MAGNESIUM: Magnesium: 2.4 mg/dL (ref 1.7–2.4)

## 2019-08-09 LAB — LACTATE DEHYDROGENASE: LDH: 186 U/L (ref 98–192)

## 2019-08-09 LAB — C-REACTIVE PROTEIN: CRP: <0.8 mg/dL (ref <1.0)

## 2019-08-09 LAB — SARS CORONAVIRUS 2 BY RT PCR (HOSPITAL ORDER, PERFORMED IN ~~LOC~~ HOSPITAL LAB): SARS Coronavirus 2: NEGATIVE

## 2019-08-09 LAB — CBG MONITORING, ED: Glucose-Capillary: 106 mg/dL — ABNORMAL HIGH (ref 70–99)

## 2019-08-09 LAB — LACTIC ACID, PLASMA
Lactic Acid, Venous: 1.5 mmol/L (ref 0.5–1.9)
Lactic Acid, Venous: 1.3 mmol/L (ref 0.5–1.9)

## 2019-08-09 LAB — HIV ANTIBODY (ROUTINE TESTING W REFLEX): HIV Screen 4th Generation wRfx: NONREACTIVE

## 2019-08-09 LAB — D-DIMER, QUANTITATIVE: D-Dimer, Quant: 0.86 ug/mL-FEU — ABNORMAL HIGH (ref 0.00–0.50)

## 2019-08-09 SURGERY — ECHOCARDIOGRAM, TRANSESOPHAGEAL
Anesthesia: Monitor Anesthesia Care

## 2019-08-09 MED ORDER — ACETAMINOPHEN 650 MG RE SUPP: 650.0000 mg | Freq: Four times a day (QID) | RECTAL | Status: DC | PRN

## 2019-08-09 MED ORDER — HYDROCORTISONE 2.5 % RE CREA: 1.0000 "application " | TOPICAL_CREAM | Freq: Two times a day (BID) | RECTAL | Status: DC

## 2019-08-09 MED ORDER — FUROSEMIDE 10 MG/ML IJ SOLN
60.0000 mg | Freq: Two times a day (BID) | INTRAMUSCULAR | Status: DC
  Administered 2019-08-09 – 2019-08-14 (×10): 60 mg via INTRAVENOUS
  Filled 2019-08-09 (×12): qty 6

## 2019-08-09 MED ORDER — OXYCODONE-ACETAMINOPHEN 5-325 MG PO TABS: 2.0000 | ORAL_TABLET | Freq: Four times a day (QID) | ORAL | Status: DC | PRN

## 2019-08-09 MED ORDER — PANTOPRAZOLE SODIUM 40 MG PO TBEC: 40.0000 mg | DELAYED_RELEASE_TABLET | Freq: Two times a day (BID) | ORAL | Status: DC

## 2019-08-09 MED ORDER — ACETAMINOPHEN 325 MG PO TABS: 650.0000 mg | ORAL_TABLET | Freq: Four times a day (QID) | ORAL | Status: DC | PRN

## 2019-08-09 MED ORDER — IOHEXOL 350 MG/ML SOLN
60.0000 mL | Freq: Once | INTRAVENOUS | Status: AC | PRN
  Administered 2019-08-09: 23:00:00 60 mL via INTRAVENOUS

## 2019-08-09 MED ORDER — SODIUM CHLORIDE 0.9 % IV SOLN
1.0000 g | Freq: Once | INTRAVENOUS | Status: AC
  Administered 2019-08-09: 1 g via INTRAVENOUS
  Filled 2019-08-09: qty 10

## 2019-08-09 MED ORDER — TAMSULOSIN HCL 0.4 MG PO CAPS
0.4000 mg | ORAL_CAPSULE | Freq: Every day | ORAL | Status: DC
  Administered 2019-08-10 – 2019-08-14 (×5): 0.4 mg via ORAL
  Filled 2019-08-09 (×5): qty 1

## 2019-08-09 MED ORDER — INSULIN ASPART PROT & ASPART (70-30 MIX) 100 UNIT/ML PEN: 10.0000 [IU] | PEN_INJECTOR | Freq: Two times a day (BID) | SUBCUTANEOUS | Status: DC

## 2019-08-09 MED ORDER — SODIUM CHLORIDE 0.9 % IV SOLN
1.0000 g | INTRAVENOUS | Status: DC
  Administered 2019-08-09 – 2019-08-12 (×4): 1 g via INTRAVENOUS
  Filled 2019-08-09 (×4): qty 10

## 2019-08-09 MED ORDER — METHYLPREDNISOLONE SODIUM SUCC 125 MG IJ SOLR
125.0000 mg | Freq: Once | INTRAMUSCULAR | Status: AC
  Administered 2019-08-09: 16:00:00 125 mg via INTRAVENOUS
  Filled 2019-08-09: qty 2

## 2019-08-09 MED ORDER — ALBUTEROL SULFATE HFA 108 (90 BASE) MCG/ACT IN AERS
8.0000 | INHALATION_SPRAY | Freq: Once | RESPIRATORY_TRACT | Status: DC
  Filled 2019-08-09: qty 6.7

## 2019-08-09 MED ORDER — MAGNESIUM OXIDE 400 (241.3 MG) MG PO TABS
800.0000 mg | ORAL_TABLET | Freq: Once | ORAL | Status: AC
  Administered 2019-08-09: 17:00:00 800 mg via ORAL
  Filled 2019-08-09: qty 2

## 2019-08-09 MED ORDER — PIPERACILLIN-TAZOBACTAM 3.375 G IVPB 30 MIN
3.3750 g | Freq: Once | INTRAVENOUS | Status: AC
  Administered 2019-08-09: 16:00:00 3.375 g via INTRAVENOUS
  Filled 2019-08-09: qty 50

## 2019-08-09 MED ORDER — APIXABAN 5 MG PO TABS
5.0000 mg | ORAL_TABLET | Freq: Two times a day (BID) | ORAL | Status: DC
  Filled 2019-08-09: qty 1

## 2019-08-09 MED ORDER — POTASSIUM CHLORIDE CRYS ER 20 MEQ PO TBCR
40.0000 meq | EXTENDED_RELEASE_TABLET | Freq: Once | ORAL | Status: AC
  Administered 2019-08-09: 17:00:00 40 meq via ORAL
  Filled 2019-08-09: qty 2

## 2019-08-09 MED ORDER — MAGNESIUM SULFATE 2 GM/50ML IV SOLN
2.0000 g | Freq: Once | INTRAVENOUS | Status: AC
  Administered 2019-08-09: 16:00:00 2 g via INTRAVENOUS
  Filled 2019-08-09: qty 50

## 2019-08-09 MED ORDER — GABAPENTIN 300 MG PO CAPS: 300.0000 mg | ORAL_CAPSULE | Freq: Two times a day (BID) | ORAL | Status: DC

## 2019-08-09 MED ORDER — POLYETHYLENE GLYCOL 3350 17 G PO PACK
17.0000 g | PACK | Freq: Every day | ORAL | Status: DC
  Administered 2019-08-14 – 2019-08-15 (×2): 17 g via ORAL
  Filled 2019-08-09 (×4): qty 1

## 2019-08-09 MED ORDER — MOMETASONE FURO-FORMOTEROL FUM 200-5 MCG/ACT IN AERO
2.0000 | INHALATION_SPRAY | Freq: Two times a day (BID) | RESPIRATORY_TRACT | Status: DC
  Filled 2019-08-09: qty 8.8

## 2019-08-09 MED ORDER — ISOSORB DINITRATE-HYDRALAZINE 20-37.5 MG PO TABS: 0.5000 | ORAL_TABLET | Freq: Three times a day (TID) | ORAL | Status: DC

## 2019-08-09 MED ORDER — ENOXAPARIN SODIUM 40 MG/0.4ML ~~LOC~~ SOLN: 40.0000 mg | SUBCUTANEOUS | Status: DC

## 2019-08-09 MED ORDER — ALBUTEROL SULFATE (2.5 MG/3ML) 0.083% IN NEBU: 2.5000 mg | INHALATION_SOLUTION | RESPIRATORY_TRACT | Status: DC | PRN

## 2019-08-09 MED ORDER — FLUTICASONE PROPIONATE 50 MCG/ACT NA SUSP
2.0000 | Freq: Every day | NASAL | Status: DC | PRN
  Filled 2019-08-09: qty 16

## 2019-08-09 MED ORDER — VANCOMYCIN HCL 10 G IV SOLR
2000.0000 mg | Freq: Once | INTRAVENOUS | Status: AC
  Administered 2019-08-09: 17:00:00 2000 mg via INTRAVENOUS
  Filled 2019-08-09: qty 2000

## 2019-08-09 MED ORDER — OXYBUTYNIN CHLORIDE 5 MG PO TABS: 5.0000 mg | ORAL_TABLET | Freq: Four times a day (QID) | ORAL | Status: DC

## 2019-08-09 MED ORDER — INSULIN ASPART 100 UNIT/ML ~~LOC~~ SOLN: 4.0000 [IU] | Freq: Three times a day (TID) | SUBCUTANEOUS | Status: DC

## 2019-08-09 MED ORDER — METOLAZONE 2.5 MG PO TABS: 2.5000 mg | ORAL_TABLET | ORAL | Status: DC

## 2019-08-09 MED ORDER — POTASSIUM CHLORIDE CRYS ER 20 MEQ PO TBCR: 60.0000 meq | EXTENDED_RELEASE_TABLET | Freq: Two times a day (BID) | ORAL | Status: DC

## 2019-08-09 MED ORDER — SORBITOL 70 % SOLN
30.0000 mL | Freq: Every day | Status: DC | PRN
  Filled 2019-08-09: qty 30

## 2019-08-09 MED ORDER — ASPIRIN EC 81 MG PO TBEC: 81.0000 mg | DELAYED_RELEASE_TABLET | Freq: Every day | ORAL | Status: DC

## 2019-08-09 MED ORDER — FERROUS SULFATE 325 (65 FE) MG PO TABS: 325.0000 mg | ORAL_TABLET | Freq: Two times a day (BID) | ORAL | Status: DC

## 2019-08-09 MED ORDER — SALINE SPRAY 0.65 % NA SOLN
2.0000 | NASAL | Status: DC | PRN
  Filled 2019-08-09: qty 44

## 2019-08-09 MED ORDER — IPRATROPIUM-ALBUTEROL 20-100 MCG/ACT IN AERS
2.0000 | INHALATION_SPRAY | Freq: Two times a day (BID) | RESPIRATORY_TRACT | Status: DC
  Filled 2019-08-09: qty 4

## 2019-08-09 MED ORDER — TORSEMIDE 20 MG PO TABS: 80.0000 mg | ORAL_TABLET | Freq: Two times a day (BID) | ORAL | Status: DC

## 2019-08-09 MED ORDER — HYDROCORTISONE 1 % EX CREA
1.0000 "application " | TOPICAL_CREAM | Freq: Two times a day (BID) | CUTANEOUS | Status: DC | PRN
  Administered 2019-08-14: 1 via TOPICAL
  Filled 2019-08-09: qty 28
  Filled 2019-08-09: qty 28.35

## 2019-08-09 MED ORDER — BISOPROLOL FUMARATE 5 MG PO TABS: 5.0000 mg | ORAL_TABLET | Freq: Every day | ORAL | Status: DC

## 2019-08-09 MED ORDER — SENNOSIDES-DOCUSATE SODIUM 8.6-50 MG PO TABS: 1.0000 | ORAL_TABLET | Freq: Every evening | ORAL | Status: DC | PRN

## 2019-08-09 MED ORDER — METHYLPREDNISOLONE SODIUM SUCC 125 MG IJ SOLR
60.0000 mg | Freq: Four times a day (QID) | INTRAMUSCULAR | Status: DC
  Administered 2019-08-10: 60 mg via INTRAVENOUS
  Filled 2019-08-09: qty 2

## 2019-08-09 MED ORDER — IPRATROPIUM-ALBUTEROL 0.5-2.5 (3) MG/3ML IN SOLN
3.0000 mL | Freq: Four times a day (QID) | RESPIRATORY_TRACT | Status: DC
  Administered 2019-08-09 – 2019-08-10 (×5): 3 mL via RESPIRATORY_TRACT
  Filled 2019-08-09 (×5): qty 3

## 2019-08-09 MED ORDER — IPRATROPIUM-ALBUTEROL 0.5-2.5 (3) MG/3ML IN SOLN: 3.0000 mL | RESPIRATORY_TRACT | Status: AC

## 2019-08-09 MED ORDER — POTASSIUM CHLORIDE 10 MEQ/100ML IV SOLN
10.0000 meq | INTRAVENOUS | Status: AC
  Administered 2019-08-09 (×4): 10 meq via INTRAVENOUS
  Filled 2019-08-09 (×4): qty 100

## 2019-08-09 NOTE — ED Notes (Signed)
Condom cath placed on pt

## 2019-08-09 NOTE — ED Provider Notes (Signed)
[facility] Provider Note   CSN: [id number] Arrival date & time: 08/09/19  1343     History   Chief Complaint Chief Complaint  Patient presents with  . Shortness of Breath    HPI Giulian Goldring is a 73 y.o. male.     73 yo M with a chief complaint of cough and shortness of breath.  Going on for about 3 days.  Has had some subjective fevers as well.  Describes them as minimal.  He is having of productive cough of yellowish sputum.  Was recently in the hospital.  Patient also feels that his fluid level is higher than normal.  Has been taking his medications as prescribed.  There was some confusion as the patient was scheduled for a elective cardioversion today.  The patient showed up in the ED and then was sent upstairs and was sent back down to the ED.  Patient states that he was sent here due to shortness of breath.  He feels that the cardioversion was likely canceled.  The history is provided by the patient.  Shortness of Breath Severity:  Moderate Onset quality:  Gradual Duration:  3 days Timing:  Constant Progression:  Worsening Chronicity:  New Context: activity   Relieved by:  Nothing Worsened by:  Nothing Ineffective treatments:  None tried Associated symptoms: cough and fever   Associated symptoms: no abdominal pain, no chest pain, no headaches, no rash and no vomiting     Past Medical History:  Diagnosis Date  . Atrial flutter ([place])    a. recurrent AFlutter with RVR;  b. Amiodarone Rx started 4/16  . CAD (coronary artery disease)    a. LHC 1/16:  mLAD diffuse disease, pLCx mild disease, dLCx with disease but too small for PCI, RCA ok, EF 25-30%  . Chronic pain   . Chronic systolic CHF (congestive heart failure) ([place])   . COPD (chronic obstructive pulmonary disease) ([place])   . Diabetes mellitus without complication ([place])   . Hypercholesteremia   . Hypertension   . NICM (nonischemic cardiomyopathy) ([place])    a.dx 2016. b.  2D echo 06/2016 - Last echo 07/01/16: mod dilated LV, mod LVH, EF 25-30%, mild-mod MR, sev LAE, mild-mod reduced RV systolic function, mild-mod TR, PASP 46mHG.  .Marland KitchenPAF (paroxysmal atrial fibrillation) (HCC)    On amio - ot a candidate for flecainide due to cardiomyopathy, not a candidate for Tikosyn due to prolonged QT, and felt to be a poor candidate for ablation given left atrial size.  . Pulmonary hypertension ([place]   . Tobacco abuse     Patient Active Problem List   Diagnosis Date Noted  . Palliative care by specialist   . DNR (do not resuscitate) discussion   . Fatigue   . Acute CHF (congestive heart failure) ([facility] 07/07/2019  . Acute on chronic heart failure ([place] 06/09/2019  . Lobar pneumonia ([place] 04/14/2019  . Hyperkalemia 04/10/2019  . Cerebral embolism with cerebral infarction 03/21/2019  . Gastritis and gastroduodenitis   . NSVT (nonsustained ventricular tachycardia) ([place] 03/08/2019  . Tobacco abuse counseling 03/08/2019  . Iron deficiency anemia   . Acute on chronic systolic CHF (congestive heart failure) ([place] 03/06/2019  . Diabetes mellitus type 2 in obese ([place]   . Primary osteoarthritis of right hip   . Hypomagnesemia   . Steroid-induced hyperglycemia   . Supplemental oxygen dependent   . Leukocytosis   . Hypokalemia   . Acute on chronic anemia   .  Diabetic peripheral neuropathy ([place])   . Acute on chronic systolic (congestive) heart failure ([place])   . Acute on chronic respiratory failure ([place]) 01/14/2019  . Hypoxia   . Altered mental status   . Heart failure ([place]) 12/30/2018  . AKI (acute kidney injury) ([place])   . Acute respiratory failure ([place]) 11/22/2018  . Acute on chronic respiratory failure with hypoxia and hypercapnia ([place]) 11/13/2018  . Metabolic encephalopathy 81/84/0375  . Acute respiratory failure with hypoxia and hypercapnia ([place]) 07/26/2018  . Acute exacerbation of CHF (congestive heart failure) ([place]) 07/26/2018  . CHF exacerbation ([place]) 05/01/2018   . CHF (congestive heart failure) ([place]) 04/30/2018  . Chronic respiratory failure with hypoxia ([place]) 12/28/2017  . Acute encephalopathy   . Atrial fibrillation with RVR ([place])   . SVT (supraventricular tachycardia) ([place])   . COPD GOLD 0   . Medically noncompliant   . Panlobular emphysema ([place])   . OSA (obstructive sleep apnea)   . Pulmonary hypertension ([place])   . Disorientation   . Pressure injury of skin 09/07/2016  . Acute on chronic combined systolic and diastolic CHF (congestive heart failure) ([place]) 09/05/2016  . Skin lesion-left heal 09/05/2016  . Chronic systolic CHF (congestive heart failure) ([place]) 07/16/2016  . COPD (chronic obstructive pulmonary disease) ([place]) 07/16/2016  . Uncontrolled type 2 diabetes mellitus with complication ([place])   . Diabetic polyneuropathy associated with diabetes mellitus due to underlying condition ([place])   . Normocytic anemia 06/29/2016  . CAD - Non-obstructive by LHC1/16 01/21/2016  . Prolonged QT interval 10/09/2015  . Nonischemic cardiomyopathy ([place]) 10/09/2015  . PAF (paroxysmal atrial fibrillation) ([place])   . Chronic pain 02/19/2015  . Morbid obesity ([place]) 02/13/2015  . Dyspnea   . Elevated troponin I level 11/01/2014  . COPD exacerbation ([place]) 11/01/2014  . Essential hypertension 10/31/2014  . Type 2 diabetes mellitus with neuropathy 10/31/2014  . Hyperlipidemia  10/31/2014  . Cigarette smoker 10/31/2014    Past Surgical History:  Procedure Laterality Date  . BIOPSY  03/11/2019   Procedure: BIOPSY;  Surgeon: Lavena Bullion, DO;  Location: [facility];  Service: Gastroenterology;;  . BIOPSY  03/15/2019   Procedure: BIOPSY;  Surgeon: Irving Copas., MD;  Location: [facility];  Service: Gastroenterology;;  . CARDIOVERSION N/A 07/30/2018   Procedure: CARDIOVERSION;  Surgeon: Larey Dresser, MD;  Location: [facility] ENDOSCOPY;  Service: Cardiovascular;  Laterality: N/A;  . COLONOSCOPY N/A 03/11/2019   Procedure: COLONOSCOPY;  Surgeon:  Lavena Bullion, DO;  Location: [facility];  Service: Gastroenterology;  Laterality: N/A;  . ENTEROSCOPY N/A 03/15/2019   Procedure: ENTEROSCOPY;  Surgeon: Rush Landmark Telford Nab., MD;  Location: [facility];  Service: Gastroenterology;  Laterality: N/A;  . ESOPHAGOGASTRODUODENOSCOPY Left 03/11/2019   Procedure: ESOPHAGOGASTRODUODENOSCOPY (EGD);  Surgeon: Lavena Bullion, DO;  Location: [facility] ENDOSCOPY;  Service: Gastroenterology;  Laterality: Left;  . LEFT AND RIGHT HEART CATHETERIZATION WITH CORONARY ANGIOGRAM N/A 11/06/2014   Procedure: LEFT AND RIGHT HEART CATHETERIZATION WITH CORONARY ANGIOGRAM;  Surgeon: Jettie Booze, MD;  Location: [facility] CATH LAB;  Service: Cardiovascular;  Laterality: N/A;  . RIGHT/LEFT HEART CATH AND CORONARY ANGIOGRAPHY N/A 12/06/2018   Procedure: RIGHT/LEFT HEART CATH AND CORONARY ANGIOGRAPHY;  Surgeon: Larey Dresser, MD;  Location: [facility] CV LAB;  Service: Cardiovascular;  Laterality: N/A;  . SPLENECTOMY    . SUBMUCOSAL TATTOO INJECTION  03/15/2019   Procedure: SUBMUCOSAL TATTOO INJECTION;  Surgeon: Irving Copas., MD;  Location: [facility];  Service: Gastroenterology;;  Home Medications    Prior to Admission medications   Medication Sig Start Date End Date Taking? Authorizing Provider  albuterol (VENTOLIN HFA) 108 (90 Base) MCG/ACT inhaler Inhale 2 puffs into the lungs every 6 (six) hours as needed for wheezing or shortness of breath.    [provider]  apixaban (ELIQUIS) 5 MG TABS tablet Take 1 tablet (5 mg total) by mouth 2 (two) times daily. 03/31/19   Kayleen Memos, DO  aspirin EC 81 MG EC tablet Take 1 tablet (81 mg total) by mouth daily. 06/14/19   Swayze, Ava, DO  bisoprolol (ZEBETA) 5 MG tablet Take 1 tablet (5 mg total) by mouth daily. Patient taking differently: Take 5 mg by mouth at bedtime.  08/02/19   Clegg, Amy D, NP  budesonide-formoterol (SYMBICORT) 160-4.5 MCG/ACT inhaler Inhale 2 puffs into the lungs as  needed (shortness of breath).     [provider]  ferrous sulfate 325 (65 FE) MG tablet Take 325 mg by mouth 2 (two) times daily.    [provider]  fluticasone (FLONASE) 50 MCG/ACT nasal spray Place 2 sprays into both nostrils daily as needed for allergies.     [provider]  gabapentin (NEURONTIN) 300 MG capsule Take 1 capsule (300 mg total) by mouth 2 (two) times daily. 06/13/19   Swayze, Ava, DO  hydrocortisone (ANUSOL-HC) 2.5 % rectal cream Place 1 application rectally 2 (two) times daily as needed for hemorrhoids or itching.     [provider]  hydrocortisone 1 % lotion Apply 1 application topically See admin instructions. Apply small amount to back twice daily for itchy rash as needed    [provider]  insulin aspart (NOVOLOG) 100 UNIT/ML injection Inject 4 Units into the skin 3 (three) times daily with meals. 03/31/19   Kayleen Memos, DO  insulin aspart protamine - aspart (NOVOLOG MIX 70/30 FLEXPEN) (70-30) 100 UNIT/ML FlexPen Inject 0.1 mLs (10 Units total) into the skin 2 (two) times daily. 03/31/19   Kayleen Memos, DO  ipratropium-albuterol (DUONEB) 0.5-2.5 (3) MG/3ML SOLN Take 3 mLs by nebulization 2 (two) times daily as needed (shortness of breath/wheezing).     [provider]  isosorbide-hydrALAZINE (BIDIL) 20-37.5 MG tablet Take 0.5 tablets by mouth 3 (three) times daily. 05/18/19   Larey Dresser, MD  metolazone (ZAROXOLYN) 2.5 MG tablet Take 1 tablet (2.5 mg total) by mouth 4 (four) times a week. Take extra Potassium 31mq when taking this medication 06/30/19   MLarey Dresser MD  oxybutynin (DITROPAN) 5 MG tablet Take 1 tablet (5 mg total) by mouth 4 (four) times daily. 06/13/19   Swayze, Ava, DO  oxyCODONE-acetaminophen (PERCOCET) 10-325 MG tablet Take 1 tablet by mouth every 6 (six) hours as needed for pain.  05/16/19   [provider]  OXYGEN Inhale 3 L into the lungs continuous.     [provider]   pantoprazole (PROTONIX) 40 MG tablet Take 1 tablet (40 mg total) by mouth 2 (two) times daily. 05/05/19   Clegg, Amy D, NP  polyethylene glycol (MIRALAX / GLYCOLAX) packet Take 17 g by mouth daily. 01/24/19   Angiulli, DLavon Paganini PA-C  potassium chloride SA (K-DUR) 20 MEQ tablet Take 3 tablets (60 mEq total) by mouth 2 (two) times daily. With additional 279m when you take Metolazone. 06/13/19   Swayze, Ava, DO  PRESCRIPTION MEDICATION Cpap    [provider]  rosuvastatin (CRESTOR) 20 MG tablet Take 1 tablet (20 mg total)  by mouth daily at 6 PM. 05/18/19   Larey Dresser, MD  sodium chloride (OCEAN) 0.65 % SOLN nasal spray Place 2 sprays into both nostrils as needed for congestion.     [provider]  tamsulosin (FLOMAX) 0.4 MG CAPS capsule Take 1 capsule (0.4 mg total) by mouth daily after supper. 04/20/19   Clegg, Amy D, NP  torsemide (DEMADEX) 20 MG tablet Take 4 tablets (80 mg total) by mouth 2 (two) times daily. 06/23/19   Larey Dresser, MD    Family History Family History  Problem Relation Age of Onset  . Heart disease Mother   . Hypertension Mother   . Heart failure Mother   . Heart disease Father   . Hypertension Sister   . Heart attack Neg Hx   . Stroke Neg Hx   . Colon cancer Neg Hx   . Esophageal cancer Neg Hx     Social History Social History   Tobacco Use  . Smoking status: Former Smoker    Packs/day: 1.00    Years: 34.00    Pack years: 34.00    Types: Cigarettes    Quit date: 11/30/2018    Years since quitting: 0.6  . Smokeless tobacco: Never Used  Substance Use Topics  . Alcohol use: No    Alcohol/week: 0.0 standard drinks  . Drug use: No     Allergies   Patient has no known allergies.   Review of Systems Review of Systems  Constitutional: Positive for fever. Negative for chills.  HENT: Negative for congestion and facial swelling.   Eyes: Negative for discharge and visual disturbance.  Respiratory: Positive for cough and shortness of  breath.   Cardiovascular: Negative for chest pain and palpitations.  Gastrointestinal: Negative for abdominal pain, diarrhea and vomiting.  Musculoskeletal: Negative for arthralgias and myalgias.  Skin: Negative for color change and rash.  Neurological: Negative for tremors, syncope and headaches.  Psychiatric/Behavioral: Negative for confusion and dysphoric mood.     Physical Exam Updated Vital Signs BP (!) 137/59   Pulse 67   Temp 98.8 F (37.1 C) (Oral)   Resp 16   SpO2 95%   Physical Exam Vitals signs and nursing note reviewed.  Constitutional:      Appearance: He is well-developed. He is ill-appearing.  HENT:     Head: Normocephalic and atraumatic.  Eyes:     Pupils: Pupils are equal, round, and reactive to light.  Neck:     Musculoskeletal: Normal range of motion and neck supple.     Vascular: No JVD.  Cardiovascular:     Rate and Rhythm: Normal rate and regular rhythm.     Heart sounds: No murmur. No friction rub. No gallop.   Pulmonary:     Effort: No respiratory distress.     Breath sounds: Rhonchi present. No wheezing.     Comments: Coarse breath sounds in all lung fields.  Prolonged expiratory effort. Abdominal:     General: There is no distension.     Tenderness: There is no guarding or rebound.  Musculoskeletal: Normal range of motion.  Skin:    Coloration: Skin is not pale.     Findings: No rash.  Neurological:     Mental Status: He is alert and oriented to person, place, and time.  Psychiatric:        Behavior: Behavior normal.      ED Treatments / Results  Labs (all labs ordered are listed, but only abnormal results are  displayed) Labs Reviewed  CBC WITH DIFFERENTIAL/PLATELET - Abnormal; Notable for the following components:      Result Value   RBC 3.94 (*)    Hemoglobin 10.6 (*)    HCT 34.4 (*)    RDW 21.8 (*)    nRBC 0.3 (*)    Lymphs Abs 0.4 (*)    All other components within normal limits  COMPREHENSIVE METABOLIC PANEL - Abnormal;  Notable for the following components:   Potassium 2.5 (*)    Chloride 78 (*)    CO2 39 (*)    BUN 80 (*)    Creatinine, Ser 2.25 (*)    Alkaline Phosphatase 130 (*)    GFR calc non Af Amer 28 (*)    GFR calc Af Amer 33 (*)    Anion gap 18 (*)    All other components within normal limits  D-DIMER, QUANTITATIVE (NOT AT [facility]) - Abnormal; Notable for the following components:   D-Dimer, Quant 0.86 (*)    All other components within normal limits  BRAIN NATRIURETIC PEPTIDE - Abnormal; Notable for the following components:   B Natriuretic Peptide 2,698.3 (*)    All other components within normal limits  POCT I-STAT EG7 - Abnormal; Notable for the following components:   pCO2, Ven 87.9 (*)    Bicarbonate 50.4 (*)    TCO2 >50 (*)    Acid-Base Excess 20.0 (*)    Sodium 132 (*)    Calcium, Ion 0.99 (*)    All other components within normal limits  TROPONIN I (HIGH SENSITIVITY) - Abnormal; Notable for the following components:   Troponin I (High Sensitivity) 60 (*)    All other components within normal limits  TROPONIN I (HIGH SENSITIVITY) - Abnormal; Notable for the following components:   Troponin I (High Sensitivity) 61 (*)    All other components within normal limits  SARS CORONAVIRUS 2 (HOSPITAL ORDER, [place])  CULTURE, BLOOD (ROUTINE X 2)  CULTURE, BLOOD (ROUTINE X 2)  CULTURE, BLOOD (ROUTINE X 2)  CULTURE, BLOOD (ROUTINE X 2)  LACTIC ACID, PLASMA  PROCALCITONIN  LACTATE DEHYDROGENASE  FIBRINOGEN  TRIGLYCERIDES  C-REACTIVE PROTEIN  FERRITIN  MAGNESIUM  LACTIC ACID, PLASMA  HIV ANTIBODY (ROUTINE TESTING W REFLEX)  HIV4GL SAVE TUBE  COMPREHENSIVE METABOLIC PANEL  CBC WITH DIFFERENTIAL/PLATELET  BLOOD GAS, ARTERIAL  CBC  BRAIN NATRIURETIC PEPTIDE  HEMOGLOBIN A1C  CBC    EKG EKG Interpretation  Date/Time:  Tuesday August 09 2019 14:14:13 EDT Ventricular Rate:  69 PR Interval:  212 QRS Duration: 118 QT Interval:  382 QTC  Calculation: 409 R Axis:   115 Text Interpretation:  Sinus rhythm with 1st degree A-V block with Premature atrial complexes Right axis deviation Low voltage QRS Incomplete right bundle branch block Nonspecific ST and T wave abnormality Abnormal ECG rate slower, afib resolved compared with 9/20 Confirmed by Aletta Edouard [phone]) on 08/09/2019 2:47:22 PM   Radiology Dg Chest Port 1 View  Result Date: 08/09/2019 CLINICAL DATA:  Shortness of breath EXAM: PORTABLE CHEST 1 VIEW COMPARISON:  July 18, 2019 FINDINGS: The heart size is enlarged. There is pulmonary edema. Consolidation of bilateral lung bases with bilateral pleural effusions are noted. Bony structures are stable. IMPRESSION: Congestive heart failure. Patchy consolidation of bilateral lung bases, superimposed pneumonia is not excluded. Bilateral pleural effusions. Electronically Signed   By: Abelardo Diesel M.D.   On: 08/09/2019 15:12    Procedures Procedures (including critical care time)  Medications Ordered  in ED Medications  vancomycin (VANCOCIN) 2,000 mg in sodium chloride 0.9 % 500 mL IVPB (2,000 mg Intravenous New Bag/Given 08/09/19 1701)  potassium chloride 10 mEq in 100 mL IVPB (10 mEq Intravenous New Bag/Given 08/09/19 1742)  ipratropium-albuterol (DUONEB) 0.5-2.5 (3) MG/3ML nebulizer solution 3 mL (has no administration in time range)  calcium gluconate 1 g in sodium chloride 0.9 % 100 mL IVPB (has no administration in time range)  enoxaparin (LOVENOX) injection 40 mg (has no administration in time range)  acetaminophen (TYLENOL) tablet 650 mg (has no administration in time range)    Or  acetaminophen (TYLENOL) suppository 650 mg (has no administration in time range)  senna-docusate (Senokot-S) tablet 1 tablet (has no administration in time range)  sorbitol 70 % solution 30 mL (has no administration in time range)  albuterol (PROVENTIL) (2.5 MG/3ML) 0.083% nebulizer solution 2.5 mg (has no administration in time range)   ipratropium-albuterol (DUONEB) 0.5-2.5 (3) MG/3ML nebulizer solution 3 mL (has no administration in time range)  methylPREDNISolone sodium succinate (SOLU-MEDROL) 125 mg/2 mL injection 125 mg (125 mg Intravenous Given 08/09/19 1608)  magnesium sulfate IVPB 2 g 50 mL (2 g Intravenous New Bag/Given 08/09/19 1615)  piperacillin-tazobactam (ZOSYN) IVPB 3.375 g (3.375 g Intravenous New Bag/Given 08/09/19 1614)  potassium chloride SA (KLOR-CON) CR tablet 40 mEq (40 mEq Oral Given 08/09/19 1709)  magnesium oxide (MAG-OX) tablet 800 mg (800 mg Oral Given 08/09/19 1704)     Initial Impression / Assessment and Plan / ED Course  I have reviewed the triage vital signs and the nursing notes.  Pertinent labs & imaging results that were available during my care of the patient were reviewed by me and considered in my medical decision making (see chart for details).        73 yo M with a chief complaint of shortness of breath.  This could be multifactorial in nature.  Patient has history of CHF as well as COPD.  Patient's lung sounds are difficult to auscultate due to body habitus, however coarse in all lung fields with prolonged expiratory effort.  Chest x-ray is consistent with worsening fluid burden.  Radiology read is that there are also patchy infiltrates which could be superimposed pneumonia.  Patient does think it feels like when he had pneumonia previously.  Will treat with both diuresis and beta agonist and started on antibiotics.. Reassess.  On reassessment the patient seemed very sleepy.  Will obtain a VBG.  Patient's PCO2 is significantly elevated, though when I looked at his old labs is hard to know what his baseline is.  Patient is not profoundly acidotic based on that VBG.  His mental status improved spontaneously while in the ED.  Will start on BiPAP with his respiratory issues though awaiting a COVID test.  Patient also found to be profoundly hypokalemic.  He also has a very low calcium level.   Given calcium magnesium and potassium.  His Covid test came back negative.  Discussed with the hospitalist for admission.  CRITICAL CARE Performed by: Cecilio Asper   Total critical care time: 35 minutes  Critical care time was exclusive of separately billable procedures and treating other patients.  Critical care was necessary to treat or prevent imminent or life-threatening deterioration.  Critical care was time spent personally by me on the following activities: development of treatment plan with patient and/or surrogate as well as nursing, discussions with consultants, evaluation of patient's response to treatment, examination of patient, obtaining history from patient or surrogate,  ordering and performing treatments and interventions, ordering and review of laboratory studies, ordering and review of radiographic studies, pulse oximetry and re-evaluation of patient's condition.  The patients results and plan were reviewed and discussed.   Any x-rays performed were independently reviewed by myself.   Differential diagnosis were considered with the presenting HPI.  Medications  vancomycin (VANCOCIN) 2,000 mg in sodium chloride 0.9 % 500 mL IVPB (2,000 mg Intravenous New Bag/Given 08/09/19 1701)  potassium chloride 10 mEq in 100 mL IVPB (10 mEq Intravenous New Bag/Given 08/09/19 1742)  ipratropium-albuterol (DUONEB) 0.5-2.5 (3) MG/3ML nebulizer solution 3 mL (has no administration in time range)  calcium gluconate 1 g in sodium chloride 0.9 % 100 mL IVPB (has no administration in time range)  enoxaparin (LOVENOX) injection 40 mg (has no administration in time range)  acetaminophen (TYLENOL) tablet 650 mg (has no administration in time range)    Or  acetaminophen (TYLENOL) suppository 650 mg (has no administration in time range)  senna-docusate (Senokot-S) tablet 1 tablet (has no administration in time range)  sorbitol 70 % solution 30 mL (has no administration in time range)   albuterol (PROVENTIL) (2.5 MG/3ML) 0.083% nebulizer solution 2.5 mg (has no administration in time range)  ipratropium-albuterol (DUONEB) 0.5-2.5 (3) MG/3ML nebulizer solution 3 mL (has no administration in time range)  methylPREDNISolone sodium succinate (SOLU-MEDROL) 125 mg/2 mL injection 125 mg (125 mg Intravenous Given 08/09/19 1608)  magnesium sulfate IVPB 2 g 50 mL (2 g Intravenous New Bag/Given 08/09/19 1615)  piperacillin-tazobactam (ZOSYN) IVPB 3.375 g (3.375 g Intravenous New Bag/Given 08/09/19 1614)  potassium chloride SA (KLOR-CON) CR tablet 40 mEq (40 mEq Oral Given 08/09/19 1709)  magnesium oxide (MAG-OX) tablet 800 mg (800 mg Oral Given 08/09/19 1704)    Vitals:   08/09/19 1509 08/09/19 1515 08/09/19 1520 08/09/19 1756  BP: 132/65  (!) 137/59   Pulse: 67 70  67  Resp: _0 Temp:      TempSrc:      SpO2: 98% 98% 94% 95%    Final diagnoses:  Acute on chronic respiratory failure with hypoxia and hypercapnia (HCC)    Admission/ observation were discussed with the admitting physician, patient and/or family and they are comfortable with the plan.   Final Clinical Impressions(s) / ED Diagnoses   Final diagnoses:  Acute on chronic respiratory failure with hypoxia and hypercapnia [facility])    ED Discharge Orders    None       Deno Etienne, DO 08/09/19 1809

## 2019-08-09 NOTE — ED Provider Notes (Signed)
Patient erroneously in the ED, was supposed to be a direct admit. No patient care performed.    Sherwood Gambler, MD 08/09/19 1349

## 2019-08-09 NOTE — ED Notes (Signed)
SDU   [phone] pts wife Mrs. Meschke would like a call with an update

## 2019-08-09 NOTE — H&P (Signed)
Nicholas Caldwell is an 73 y.o. male.   Chief Complaint: Shortness of breath and cough. HPI: The patient is a 73 yr old man who was scheduled for a TEE and DCCV today for his atrial fibrillation. Oddly, he is in sinus rhythm today. The patient has had a productive cough for 3 days with subjective fevers. He states that he feels that he has been putting on fluid as well. His cough is productive of yellow sputum. He tested negative for COVID-19 2 days ago and again just now.   The patient has a past medical history significant for CAD, Paroxysmal Atrial Fibrillation, Pulmonary hypertension, Non-ischemic cardiomyopathy, Chronic systolic CHF, COPD, Chronic hypoxic respiratory failure, on 2L by [place] chronically at home, hypertension, hyperlipidemia, and chronic pain.  In the ED the patient was found to be hypoxic, hypercarbic, hypokalemic, Hypocalcemic, with a BNP of 2698.3. This was previously of 2064.2 on 08/02/2019, 2211.9 on 07/07/2019, 2300 on 06/08/2019. He has a positive d-dimer.   [facility] hospitalists were consulted to admit the patient for further evaluation and care.  Past Medical History:  Diagnosis Date  . Atrial flutter ([place])    a. recurrent AFlutter with RVR;  b. Amiodarone Rx started 4/16  . CAD (coronary artery disease)    a. LHC 1/16:  mLAD diffuse disease, pLCx mild disease, dLCx with disease but too small for PCI, RCA ok, EF 25-30%  . Chronic pain   . Chronic systolic CHF (congestive heart failure) ([place])   . COPD (chronic obstructive pulmonary disease) ([place])   . Diabetes mellitus without complication ([place])   . Hypercholesteremia   . Hypertension   . NICM (nonischemic cardiomyopathy) ([place])    a.dx 2016. b. 2D echo 06/2016 - Last echo 07/01/16: mod dilated LV, mod LVH, EF 25-30%, mild-mod MR, sev LAE, mild-mod reduced RV systolic function, mild-mod TR, PASP 41mHG.  .Marland KitchenPAF (paroxysmal atrial fibrillation) (HCC)    On amio - ot a candidate for flecainide due to cardiomyopathy, not a candidate for  Tikosyn due to prolonged QT, and felt to be a poor candidate for ablation given left atrial size.  . Pulmonary hypertension ([place]   . Tobacco abuse     Past Surgical History:  Procedure Laterality Date  . BIOPSY  03/11/2019   Procedure: BIOPSY;  Surgeon: CLavena Bullion DO;  [facility]  Service: Gastroenterology;;  . BIOPSY  03/15/2019   Procedure: BIOPSY;  Surgeon: MIrving Copas, MD;  [facility]  Service: Gastroenterology;;  . CARDIOVERSION N/A 07/30/2018   Procedure: CARDIOVERSION;  Surgeon: MLarey Dresser MD;  [facility];  Service: Cardiovascular;  Laterality: N/A;  . COLONOSCOPY N/A 03/11/2019   Procedure: COLONOSCOPY;  Surgeon: CLavena Bullion DO;  [facility]  Service: Gastroenterology;  Laterality: N/A;  . ENTEROSCOPY N/A 03/15/2019   Procedure: ENTEROSCOPY;  Surgeon: MRush LandmarkGTelford Nab, MD;  [facility]  Service: Gastroenterology;  Laterality: N/A;  . ESOPHAGOGASTRODUODENOSCOPY Left 03/11/2019   Procedure: ESOPHAGOGASTRODUODENOSCOPY (EGD);  Surgeon: CLavena Bullion DO;  [facility];  Service: Gastroenterology;  Laterality: Left;  . LEFT AND RIGHT HEART CATHETERIZATION WITH CORONARY ANGIOGRAM N/A 11/06/2014   Procedure: LEFT AND RIGHT HEART CATHETERIZATION WITH CORONARY ANGIOGRAM;  Surgeon: JJettie Booze MD;  [facility];  Service: Cardiovascular;  Laterality: N/A;  . RIGHT/LEFT HEART CATH AND CORONARY ANGIOGRAPHY N/A 12/06/2018   Procedure: RIGHT/LEFT HEART CATH AND CORONARY ANGIOGRAPHY;  Surgeon: MLarey Dresser MD;  [facility];  Service: Cardiovascular;  Laterality: N/A;  . SPLENECTOMY    . SUBMUCOSAL TATTOO INJECTION  03/15/2019   Procedure: SUBMUCOSAL TATTOO INJECTION;  Surgeon: Rush Landmark Telford Nab., MD;  Location: [facility] END[facility]COPY;  Service: Gastroenterology;;    Family History  Problem Relation Age of Onset  . Heart disease Mother   . Hypertension  Mother   . Heart failure Mother   . Heart disease Father   . Hypertension Sister   . Heart attack Neg Hx   . Stroke Neg Hx   . Colon cancer Neg Hx   . Esophageal cancer Neg Hx    Social History:  reports that he quit smoking about 8 months ago. His smoking use included cigarettes. He has a 34.00 pack-year smoking history. He has never used smokeless tobacco. He reports that he does not drink alcohol or use drugs. (Not in a hospital admission)   Allergies: No Known Allergies  Pertinent items noted in HPI and remainder of comprehensive ROS otherwise negative.   General appearance: appears stated age, no distress, morbidly obese and somnolent on bipap.  Head: Normocephalic, without obvious abnormality, atraumatic Eyes: conjunctivae/corneas clear. PERRL, EOM's intact. Fundi benign. Throat: lips, mucosa, and tongue normal; teeth and gums normal Neck: no adenopathy, no carotid bruit, no JVD, supple, symmetrical, trachea midline and thyroid not enlarged, symmetric, no tenderness/mass/nodules Resp: Diminished breath sounds bilaterally. No wheezes, rales, or rhonchi are auscultated. No tactile fremitus. Chest wall: no tenderness Cardio: regular rate and rhythm, S1, S2 normal, no murmur, click, rub or gallop GI: soft, non-tender; bowel sounds normal; no masses,  no organomegaly Extremities: extremities normal, atraumatic, no cyanosis or edema + 2 pitting edema of lower extremities bilaterally, Pulses: 2+ and symmetric Skin: Skin color, texture, turgor normal. No rashes or lesions Lymph nodes: Cervical, supraclavicular, and axillary nodes normal. Neurologic: Grossly normal   Results for orders placed or performed during the hospital encounter of 08/09/19 (from the past 48 hour(s))  CBC WITH DIFFERENTIAL     Status: Abnormal   Collection Time: 08/09/19  2:41 PM  Result Value Ref Range   WBC 6.1 4.0 - 10.5 K/uL   RBC 3.94 (L) 4.22 - 5.81 MIL/uL   Hemoglobin 10.6 (L) 13.0 - 17.0 g/dL   HCT  34.4 (L) 39.0 - 52.0 %   MCV 87.3 80.0 - 100.0 fL   MCH 26.9 26.0 - 34.0 pg   MCHC 30.8 30.0 - 36.0 g/dL   RDW 21.8 (H) 11.5 - 15.5 %   Platelets 213 150 - 400 K/uL   nRBC 0.3 (H) 0.0 - 0.2 %   Neutrophils Relative % 80 %   Neutro Abs 4.9 1.7 - 7.7 K/uL   Lymphocytes Relative 7 %   Lymphs Abs 0.4 (L) 0.7 - 4.0 K/uL   Monocytes Relative 11 %   Monocytes Absolute 0.7 0.1 - 1.0 K/uL   Eosinophils Relative 1 %   Eosinophils Absolute 0.0 0.0 - 0.5 K/uL   Basophils Relative 0 %   Basophils Absolute 0.0 0.0 - 0.1 K/uL   Immature Granulocytes 1 %   Abs Immature Granulocytes 0.03 0.00 - 0.07 K/uL    Comment: Performed at [facility], [address] [place], [place] [address]  Comprehensive metabolic panel     Status: Abnormal   Collection Time: 08/09/19  2:41 PM  Result Value Ref Range   Sodium 135 135 - 145 mmol/L   Potassium 2.5 (LL) 3.5 - 5.1 mmol/L    Comment: CRITICAL RESULT CALLED TO,  READ BACK BY AND VERIFIED WITH: E.ATKINS RN [id number] 08/09/2019 MCCORMICK K    Chloride 78 (L) 98 - 111 mmol/L   CO2 39 (H) 22 - 32 mmol/L   Glucose, Bld 84 70 - 99 mg/dL   BUN 80 (H) 8 - 23 mg/dL   Creatinine, Ser 2.25 (H) 0.61 - 1.24 mg/dL   Calcium 8.9 8.9 - 10.3 mg/dL   Total Protein 6.7 6.5 - 8.1 g/dL   Albumin 3.5 3.5 - 5.0 g/dL   AST 20 15 - 41 U/L   ALT 16 0 - 44 U/L   Alkaline Phosphatase 130 (H) 38 - 126 U/L   Total Bilirubin 1.2 0.3 - 1.2 mg/dL   GFR calc non Af Amer 28 (L) >60 mL/min   GFR calc Af Amer 33 (L) >60 mL/min   Anion gap 18 (H) 5 - 15    Comment: Performed at [facility] [address] [place], [place] [address]  D-dimer, quantitative     Status: Abnormal   Collection Time: 08/09/19  2:41 PM  Result Value Ref Range   D-Dimer, Quant 0.86 (H) 0.00 - 0.50 ug/mL-FEU    Comment: (NOTE) At the manufacturer cut-off of 0.50 ug/mL FEU, this assay has been documented to exclude PE with a sensitivity and negative predictive value of 97 to 99%.  At this time, this  assay has not been approved by the FDA to exclude DVT/VTE. Results should be correlated with clinical presentation. Performed at [facility], [place] [address], [place], [place] [address]   Procalcitonin     Status: None   Collection Time: 08/09/19  2:41 PM  Result Value Ref Range   Procalcitonin 0.47 ng/mL    Comment:        Interpretation: PCT (Procalcitonin) <= 0.5 ng/mL: Systemic infection (sepsis) is not likely. Local bacterial infection is possible. (NOTE)       Sepsis PCT Algorithm           Lower Respiratory Tract                                      Infection PCT Algorithm    ----------------------------     ----------------------------         PCT < 0.25 ng/mL                PCT < 0.10 ng/mL         Strongly encourage             Strongly discourage   discontinuation of antibiotics    initiation of antibiotics    ----------------------------     -----------------------------       PCT 0.25 - 0.50 ng/mL            PCT 0.10 - 0.25 ng/mL               OR       >80% decrease in PCT            Discourage initiation of                                            antibiotics      Encourage discontinuation           of antibiotics    ----------------------------     -----------------------------  PCT >= 0.50 ng/mL              PCT 0.26 - 0.50 ng/mL               AND        <80% decrease in PCT             Encourage initiation of                                             antibiotics       Encourage continuation           of antibiotics    ----------------------------     -----------------------------        PCT >= 0.50 ng/mL                  PCT > 0.50 ng/mL               AND         increase in PCT                  Strongly encourage                                      initiation of antibiotics    Strongly encourage escalation           of antibiotics                                     -----------------------------                                            PCT <= 0.25 ng/mL                                                 OR                                        > 80% decrease in PCT                                     Discontinue / Do not initiate                                             antibiotics Performed at [facility], [address], [place], [place] [address]   Lactate dehydrogenase     Status: None   Collection Time: 08/09/19  2:41 PM  Result Value Ref Range   LDH 186 98 - 192 U/L    Comment: Performed at [facility], [place] [address], [place], [place] [address]  Fibrinogen  Status: None   Collection Time: 08/09/19  2:41 PM  Result Value Ref Range   Fibrinogen 412 210 - 475 mg/dL    Comment: Performed at [facility] [address], [place], [place] [address]  Brain natriuretic peptide     Status: Abnormal   Collection Time: 08/09/19  2:41 PM  Result Value Ref Range   B Natriuretic Peptide 2,698.3 (H) 0.0 - 100.0 pg/mL    Comment: Performed at [facility] [address] [place], [place] [address]  Troponin I (High Sensitivity)     Status: Abnormal   Collection Time: 08/09/19  2:41 PM  Result Value Ref Range   Troponin I (High Sensitivity) 60 (H) <18 ng/L    Comment: (NOTE) Elevated high sensitivity troponin I (hsTnI) values and significant  changes across serial measurements may suggest ACS but many other  chronic and acute conditions are known to elevate hsTnI results.  Refer to the Links section for chest pain algorithms and additional  guidance. Performed at [facility], [place] [address], [place], [place] [address]   Triglycerides     Status: None   Collection Time: 08/09/19  2:41 PM  Result Value Ref Range   Triglycerides 97 <150 mg/dL    Comment: Performed at [facility], [place] [address], [place], [place] [address]  C-reactive protein     Status: None   Collection Time: 08/09/19  2:50 PM  Result Value Ref Range   CRP <0.8 <1.0 mg/dL    Comment: Performed at [facility], [place] [address] [place], [place] [address]  Ferritin     Status: None   Collection Time: 08/09/19  2:50 PM  Result Value Ref Range   Ferritin 35 24 - 336 ng/mL    Comment: Performed at [facility] [address], [place], [place] [address]  Lactic acid, plasma     Status: None   Collection Time: 08/09/19  3:10 PM  Result Value Ref Range   Lactic Acid, Venous 1.5 0.5 - 1.9 mmol/L    Comment: Performed at [facility] [address] [place], [place] [address]  SARS Coronavirus 2 [facility] order, Performed in [facility] hospital lab) Nasopharyngeal Nasopharyngeal Swab     Status: None   Collection Time: 08/09/19  3:27 PM   Specimen: Nasopharyngeal Swab  Result Value Ref Range   SARS Coronavirus 2 NEGATIVE NEGATIVE    Comment: (NOTE) If result is NEGATIVE SARS-CoV-2 target nucleic acids are NOT DETECTED. The SARS-CoV-2 RNA is generally detectable in upper and lower  respiratory specimens during the acute phase of infection. The lowest  concentration of SARS-CoV-2 viral copies this assay can detect is 250  copies / mL. A negative result does not preclude SARS-CoV-2 infection  and should not be used as the sole basis for treatment or other  patient management decisions.  A negative result may occur with  improper specimen collection / handling, submission of specimen other  than nasopharyngeal swab, presence of viral mutation(s) within the  areas targeted by this assay, and inadequate number of viral copies  (<250 copies / mL). A negative result must be combined with clinical  observations, patient history, and epidemiological information. If result is POSITIVE SARS-CoV-2 target nucleic acids are DETECTED. The SARS-CoV-2 RNA is generally detectable in upper and lower  respiratory specimens dur ing the acute phase of infection.  Positive  results are indicative of active infection with SARS-CoV-2.  Clinical  correlation with patient history and other  diagnostic information is  necessary to determine patient infection status.  Positive results do  not rule out bacterial infection or co-infection with other viruses. If result is PRESUMPTIVE POSTIVE SARS-CoV-2 nucleic acids MAY BE PRESENT.   A presumptive positive result was obtained on the submitted specimen  and confirmed on repeat testing.  While 2019 novel coronavirus  (SARS-CoV-2) nucleic acids may be present in the submitted sample  additional confirmatory testing may be necessary for epidemiological  and / or clinical management purposes  to differentiate between  SARS-CoV-2 and other Sarbecovirus currently known to infect humans.  If clinically indicated additional testing with an alternate test  methodology [phone]) is advised. The SARS-CoV-2 RNA is generally  detectable in upper and lower respiratory sp ecimens during the acute  phase of infection. The expected result is Negative. Fact Sheet for Patients:  StrictlyIdeas.no Fact Sheet for Healthcare Providers: BankingDealers.co.za This test is not yet approved or cleared by the [place] FDA and has been authorized for detection and/or diagnosis of SARS-CoV-2 by FDA under an Emergency Use Authorization (EUA).  This EUA will remain in effect (meaning this test can be used) for the duration of the COVID-19 declaration under Section 564(b)(1) of the Act, 21 U.S.C. section 360bbb-3(b)(1), unless the authorization is terminated or revoked sooner. Performed at [facility], [place] [address], [place], [place] [address]   Magnesium     Status: None   Collection Time: 08/09/19  4:00 PM  Result Value Ref Range   Magnesium 2.4 1.7 - 2.4 mg/dL    Comment: Performed at [facility], [place] [address] [place], [place] [address]  Troponin I (High Sensitivity)     Status: Abnormal   Collection Time: 08/09/19  4:00 PM  Result Value Ref Range   Troponin I (High Sensitivity) 61  (H) <18 ng/L    Comment: (NOTE) Elevated high sensitivity troponin I (hsTnI) values and significant  changes across serial measurements may suggest ACS but many other  chronic and acute conditions are known to elevate hsTnI results.  Refer to the "Links" section for chest pain algorithms and additional  guidance. Performed at [facility], [place] [address] , [place] [address]   POCT I-Stat EG7     Status: Abnormal   Collection Time: 08/09/19  4:37 PM  Result Value Ref Range   pH, Ven 7.366 7.250 - 7.430   pCO2, Ven 87.9 (HH) 44.0 - 60.0 mmHg   pO2, Ven 34.0 32.0 - 45.0 mmHg   Bicarbonate 50.4 (H) 20.0 - 28.0 mmol/L   TCO2 >50 (H) 22 - 32 mmol/L   O2 Saturation 57.0 %   Acid-Base Excess 20.0 (H) 0.0 - 2.0 mmol/L   Sodium 132 (L) 135 - 145 mmol/L   Potassium 4.0 3.5 - 5.1 mmol/L   Calcium, Ion 0.99 (L) 1.15 - 1.40 mmol/L   HCT 39.0 39.0 - 52.0 %   Hemoglobin 13.3 13.0 - 17.0 g/dL   Patient temperature HIDE    Sample type VENOUS    Comment NOTIFIED PHYSICIAN    _0 @  Blood pressure (!) 137/59, pulse 67, temperature 98.8 F (37.1 C), temperature source Oral, resp. rate 16, SpO2 95 %.    Assessment/Plan Problem  Tobacco Abuse Counseling  Supplemental Oxygen Dependent  Hypokalemia  Aki (Acute Kidney Injury) (Hcc)  Acute On Chronic Respiratory Failure With Hypoxia and Hypercapnia (Hcc)  Metabolic Encephalopathy  Acute Respiratory Failure With Hypoxia  and Hypercapnia (Hcc)  Panlobular Emphysema (Hcc)  Pulmonary hypertension (HCC)  Acute On Chronic Combined Systolic and Diastolic Chf (Congestive Heart Failure) (Hcc)  Chronic Systolic Chf (Congestive Heart Failure) (Hcc)  CAD - Non-obstructive by LHC1/16  Prolonged Qt Interval  Nonischemic Cardiomyopathy (Hcc)  Paf (Paroxysmal Atrial Fibrillation) (Hcc)  Morbid Obesity (Hcc)  Dyspnea  Copd Exacerbation (Hcc)  Essential Hypertension  Type 2 diabetes mellitus with neuropathy   Acute on chronic hypoxic  and hypercapneic respiratory failure: Secondary to COPD exacerbation, CHF exacerbation. Positive D Dimer raises concern for PE. CTA chest has been ordered. The patient will be admitted to a step down bed on BIPAP. He will receive steroids and nebulizer treatment. Will check flu, blood cultures. Will place the patient on rocephin and azithromycin.  COPD: Due to panlobular emphysema. Steroids and nebulizer treatment.   Atypical pneumonia vs bronchitis: IV Rocephin and azithromycin  Pulmonary Hypertension: Secondary to obesity and COPD.   Non-ischemic Cardiomyopathy: Noted. Monitor volume status.  Essential Hypertension: Normotensive  Acute on chronic combined systolic and diastolic failure: Diuresis. Echo from 07/08/2019 demonstrated diastolic failure and right sided heart failure.   Paroxysmal Atrial Fibrillation: Currently in sinus rhythm. Will continue anticoagulation. Monitor on telemetry.  CKD IV: Monitor creatinine, electrolytes, and volume status. Avoid nephrotoxic medications and hypotension.  Tobacco Abuse: Nicotine patch. Tobacco cessation counseling.  DM II: Continue home insulin regimen. Likely to require more insulin due to steroids. Carbohydrate modified diet and hypoglycemia protocol.   Morbid Obesity: Complicates all aspects of patient care.  I have seen and examined this patient myself. I have spent 80 minutes in his evaluation and care.  DVT Prophylaxis: Eliquis CODE STATUS: Full Code Family Communication: None available Disposition: tbd  Audra Bellard 08/09/2019, 6:21 PM

## 2019-08-09 NOTE — ED Triage Notes (Signed)
I received this patient in triage on a stretcher after pt had been received in trauma B from [facility] and then another rn had taken this pt to endo for a procedure but it was determined that due to pts new onset of sob( which is why he called ems) he could not have his procedure at this time and would need to go back to the ER. I did not receive report from [facility]. Pt tells me he called out due to feeling weak and having trouble breathing. Pt normally wears 3L of o2 but currently have him at 8L [place] and sats at 93%-pt has labored breathing and use of accessory muscles to breathe. Charge aware pt needs a treatment room stat.

## 2019-08-09 NOTE — ED Provider Notes (Signed)
MSE was initiated and I personally evaluated the patient and placed orders (if any) at  2:41 PM on August 09, 2019.  The patient appears stable so that the remainder of the MSE may be completed by another provider.  Patient was feels to be going to endoscopy today for a TEE.  He said he began getting sick about 3 days ago with increased shortness of breath cough subjective fevers and chills weakness and some chest tightness.  He called EMS to go to the hospital but it was unclear and we sent him up to endoscopy who was not ready for him and sent him back to the emergency department.  He is normally on 3 L of nasal cannula oxygen but is requiring more oxygen currently.  No sick contacts or recent travel.  Patient is not tachycardic and I do not appreciate any wheezing.  He does have chronic peripheral edema.  I ordered a screening labs and he will be seen by the oncoming provider.   Hayden Rasmussen, MD 08/09/19 [phone]

## 2019-08-10 DIAGNOSIS — Z9981 Dependence on supplemental oxygen: Secondary | ICD-10-CM

## 2019-08-10 DIAGNOSIS — I272 Pulmonary hypertension, unspecified: Secondary | ICD-10-CM

## 2019-08-10 DIAGNOSIS — R0602 Shortness of breath: Secondary | ICD-10-CM

## 2019-08-10 DIAGNOSIS — J431 Panlobular emphysema: Secondary | ICD-10-CM

## 2019-08-10 DIAGNOSIS — I10 Essential (primary) hypertension: Secondary | ICD-10-CM

## 2019-08-10 DIAGNOSIS — R9431 Abnormal electrocardiogram [ECG] [EKG]: Secondary | ICD-10-CM

## 2019-08-10 DIAGNOSIS — Z716 Tobacco abuse counseling: Secondary | ICD-10-CM

## 2019-08-10 DIAGNOSIS — I2510 Atherosclerotic heart disease of native coronary artery without angina pectoris: Secondary | ICD-10-CM

## 2019-08-10 DIAGNOSIS — J441 Chronic obstructive pulmonary disease with (acute) exacerbation: Secondary | ICD-10-CM

## 2019-08-10 DIAGNOSIS — Z794 Long term (current) use of insulin: Secondary | ICD-10-CM

## 2019-08-10 DIAGNOSIS — N179 Acute kidney failure, unspecified: Secondary | ICD-10-CM

## 2019-08-10 DIAGNOSIS — J9622 Acute and chronic respiratory failure with hypercapnia: Secondary | ICD-10-CM

## 2019-08-10 DIAGNOSIS — E1140 Type 2 diabetes mellitus with diabetic neuropathy, unspecified: Secondary | ICD-10-CM

## 2019-08-10 DIAGNOSIS — J9621 Acute and chronic respiratory failure with hypoxia: Secondary | ICD-10-CM

## 2019-08-10 DIAGNOSIS — G9341 Metabolic encephalopathy: Secondary | ICD-10-CM

## 2019-08-10 DIAGNOSIS — I428 Other cardiomyopathies: Secondary | ICD-10-CM

## 2019-08-10 DIAGNOSIS — I5043 Acute on chronic combined systolic (congestive) and diastolic (congestive) heart failure: Secondary | ICD-10-CM

## 2019-08-10 DIAGNOSIS — E876 Hypokalemia: Secondary | ICD-10-CM

## 2019-08-10 DIAGNOSIS — I5022 Chronic systolic (congestive) heart failure: Secondary | ICD-10-CM

## 2019-08-10 DIAGNOSIS — I480 Paroxysmal atrial fibrillation: Secondary | ICD-10-CM

## 2019-08-10 LAB — POCT I-STAT 7, (LYTES, BLD GAS, ICA,H+H)
Acid-Base Excess: 15 mmol/L — ABNORMAL HIGH (ref 0.0–2.0)
Bicarbonate: 43.9 mmol/L — ABNORMAL HIGH (ref 20.0–28.0)
Calcium, Ion: 1.11 mmol/L — ABNORMAL LOW (ref 1.15–1.40)
HCT: 35 % — ABNORMAL LOW (ref 39.0–52.0)
Hemoglobin: 11.9 g/dL — ABNORMAL LOW (ref 13.0–17.0)
O2 Saturation: 91 %
Patient temperature: 98.6
Potassium: 3.1 mmol/L — ABNORMAL LOW (ref 3.5–5.1)
Sodium: 134 mmol/L — ABNORMAL LOW (ref 135–145)
TCO2: 46 mmol/L — ABNORMAL HIGH (ref 22–32)
pCO2 arterial: 75.8 mmHg (ref 32.0–48.0)
pH, Arterial: 7.37 (ref 7.350–7.450)
pO2, Arterial: 67 mmHg — ABNORMAL LOW (ref 83.0–108.0)

## 2019-08-10 LAB — COMPREHENSIVE METABOLIC PANEL
ALT: 16 U/L (ref 0–44)
AST: 16 U/L (ref 15–41)
Albumin: 3.1 g/dL — ABNORMAL LOW (ref 3.5–5.0)
Alkaline Phosphatase: 112 U/L (ref 38–126)
Anion gap: 22 — ABNORMAL HIGH (ref 5–15)
BUN: 77 mg/dL — ABNORMAL HIGH (ref 8–23)
CO2: 37 mmol/L — ABNORMAL HIGH (ref 22–32)
Calcium: 8.8 mg/dL — ABNORMAL LOW (ref 8.9–10.3)
Chloride: 80 mmol/L — ABNORMAL LOW (ref 98–111)
Creatinine, Ser: 2.19 mg/dL — ABNORMAL HIGH (ref 0.61–1.24)
GFR calc Af Amer: 34 mL/min — ABNORMAL LOW (ref >60)
GFR calc non Af Amer: 29 mL/min — ABNORMAL LOW (ref >60)
Glucose, Bld: 137 mg/dL — ABNORMAL HIGH (ref 70–99)
Potassium: 3.2 mmol/L — ABNORMAL LOW (ref 3.5–5.1)
Sodium: 139 mmol/L (ref 135–145)
Total Bilirubin: 1.5 mg/dL — ABNORMAL HIGH (ref 0.3–1.2)
Total Protein: 6.2 g/dL — ABNORMAL LOW (ref 6.5–8.1)

## 2019-08-10 LAB — APTT
aPTT: 32 seconds (ref 24–36)
aPTT: 109 seconds — ABNORMAL HIGH (ref 24–36)

## 2019-08-10 LAB — CBC WITH DIFFERENTIAL/PLATELET
Abs Immature Granulocytes: 0.01 10*3/uL (ref 0.00–0.07)
Basophils Absolute: 0 10*3/uL (ref 0.0–0.1)
Basophils Relative: 0 %
Eosinophils Absolute: 0 10*3/uL (ref 0.0–0.5)
Eosinophils Relative: 0 %
HCT: 32.7 % — ABNORMAL LOW (ref 39.0–52.0)
Hemoglobin: 9.4 g/dL — ABNORMAL LOW (ref 13.0–17.0)
Immature Granulocytes: 0 %
Lymphocytes Relative: 9 %
Lymphs Abs: 0.2 10*3/uL — ABNORMAL LOW (ref 0.7–4.0)
MCH: 25.7 pg — ABNORMAL LOW (ref 26.0–34.0)
MCHC: 28.7 g/dL — ABNORMAL LOW (ref 30.0–36.0)
MCV: 89.3 fL (ref 80.0–100.0)
Monocytes Absolute: 0.1 10*3/uL (ref 0.1–1.0)
Monocytes Relative: 2 %
Neutro Abs: 2.2 10*3/uL (ref 1.7–7.7)
Neutrophils Relative %: 89 %
Platelets: 220 10*3/uL (ref 150–400)
RBC: 3.66 MIL/uL — ABNORMAL LOW (ref 4.22–5.81)
RDW: 21.6 % — ABNORMAL HIGH (ref 11.5–15.5)
WBC: 2.6 10*3/uL — ABNORMAL LOW (ref 4.0–10.5)
nRBC: 0 % (ref 0.0–0.2)

## 2019-08-10 LAB — MRSA PCR SCREENING: MRSA by PCR: NEGATIVE

## 2019-08-10 LAB — GLUCOSE, CAPILLARY: Glucose-Capillary: 208 mg/dL — ABNORMAL HIGH (ref 70–99)

## 2019-08-10 LAB — CBG MONITORING, ED: Glucose-Capillary: 169 mg/dL — ABNORMAL HIGH (ref 70–99)

## 2019-08-10 MED ORDER — IPRATROPIUM-ALBUTEROL 0.5-2.5 (3) MG/3ML IN SOLN
3.0000 mL | Freq: Three times a day (TID) | RESPIRATORY_TRACT | Status: DC
  Administered 2019-08-11 – 2019-08-15 (×13): 3 mL via RESPIRATORY_TRACT
  Filled 2019-08-10 (×4): qty 3
  Filled 2019-08-10: qty 39
  Filled 2019-08-10 (×6): qty 3

## 2019-08-10 MED ORDER — NICOTINE 21 MG/24HR TD PT24
21.0000 mg | MEDICATED_PATCH | Freq: Every day | TRANSDERMAL | Status: DC
  Administered 2019-08-10: 16:00:00 21 mg via TRANSDERMAL
  Filled 2019-08-10: qty 1

## 2019-08-10 MED ORDER — POTASSIUM CHLORIDE 10 MEQ/100ML IV SOLN
10.0000 meq | INTRAVENOUS | Status: AC
  Administered 2019-08-10 (×4): 10 meq via INTRAVENOUS
  Filled 2019-08-10 (×4): qty 100

## 2019-08-10 MED ORDER — ORAL CARE MOUTH RINSE
15.0000 mL | Freq: Two times a day (BID) | OROMUCOSAL | Status: DC
  Administered 2019-08-11 – 2019-08-12 (×3): 15 mL via OROMUCOSAL

## 2019-08-10 MED ORDER — CHLORHEXIDINE GLUCONATE CLOTH 2 % EX PADS
6.0000 | MEDICATED_PAD | Freq: Every day | CUTANEOUS | Status: DC
  Administered 2019-08-10 – 2019-08-14 (×6): 6 via TOPICAL

## 2019-08-10 MED ORDER — PANTOPRAZOLE SODIUM 40 MG IV SOLR
40.0000 mg | Freq: Every day | INTRAVENOUS | Status: DC
  Administered 2019-08-10: 22:00:00 40 mg via INTRAVENOUS
  Filled 2019-08-10: qty 40

## 2019-08-10 MED ORDER — SODIUM CHLORIDE 0.9% FLUSH: 10.0000 mL | INTRAVENOUS | Status: DC | PRN

## 2019-08-10 MED ORDER — HEPARIN (PORCINE) 25000 UT/250ML-% IV SOLN
1350.0000 [IU]/h | INTRAVENOUS | Status: DC
  Administered 2019-08-10: 13:00:00 1450 [IU]/h via INTRAVENOUS
  Filled 2019-08-10: qty 250

## 2019-08-10 MED ORDER — METHYLPREDNISOLONE SODIUM SUCC 40 MG IJ SOLR
40.0000 mg | Freq: Three times a day (TID) | INTRAMUSCULAR | Status: DC
  Administered 2019-08-10 – 2019-08-11 (×3): 40 mg via INTRAVENOUS
  Filled 2019-08-10 (×3): qty 1

## 2019-08-10 MED ORDER — METOPROLOL TARTRATE 5 MG/5ML IV SOLN
5.0000 mg | Freq: Three times a day (TID) | INTRAVENOUS | Status: DC
  Administered 2019-08-10 – 2019-08-11 (×2): 5 mg via INTRAVENOUS
  Filled 2019-08-10 (×2): qty 5

## 2019-08-10 MED ORDER — ACETYLCYSTEINE 20 % IN SOLN
4.0000 mL | Freq: Two times a day (BID) | RESPIRATORY_TRACT | Status: AC
  Administered 2019-08-10 – 2019-08-11 (×4): 4 mL via RESPIRATORY_TRACT
  Filled 2019-08-10 (×5): qty 4

## 2019-08-10 MED ORDER — HYDROCORTISONE (PERIANAL) 2.5 % EX CREA
TOPICAL_CREAM | Freq: Two times a day (BID) | CUTANEOUS | Status: DC
  Administered 2019-08-11 – 2019-08-12 (×2): via RECTAL
  Filled 2019-08-10: qty 28.35

## 2019-08-10 NOTE — ED Notes (Signed)
Condom cath had fallen off and bed cleaned and linens changed.  Pt able to assist with log roll during procedures.

## 2019-08-10 NOTE — Consult Note (Signed)
NAME:  Nicholas Caldwell, MRN:  [id number], DOB:  04/04/1946, LOS: 1 ADMISSION DATE:  08/09/2019, CONSULTATION DATE:  08/10/19 REFERRING MD:  Dr. Radene Gunning, CHIEF COMPLAINT:  Dyspnea   Brief History   73 y.o. M with PMH of COPD and diastolic HF who presented to the ED with dyspnea and three days of productive cough and subjective fever.  He was initiated on Bipap, has been intermittently somnolent, though improved this morning.  CTA chest showed no PE, but was significant for large R pleural effusion with complete collapse of the RLL.  PCCM consulted.  History of present illness   73 y.o. M with PMH of COPD on 2L home O2 and follows with Dr. Laure Kidney Pulmonary outpatient, tobacco use, CAD, Pulmonary HTN and atrial fibrillation who presented to the ED 10/6 with worsening shortness of breath, productive cough for several days and subjective fever.  He was treated with diuresis and for COPD exacerbation.  He has been intermittently very sleepy, ABG pending, VBG showed pH 7.38 and CO2 71.6 (appears near baseline).  Of note, pt was scheduled for a cardioversion the day of admission, was in sinus rhythm and sent down to the ED 2/2 dyspnea.  He was admitted to the internal medicine service and has been tolerating Bipap 12/6 40%, with O2 sats 88-92% and has been treated with bronchodilators, steroids, lasix and Rocephin.  He had a positive D-dimer, so CTA chest ordered to rule out PE.  No infiltrate on imaging and This showed a large right pleural effusion with complete collapseof the right lower lobe and progressive partial collapse of the left lower lobe.  He has been afebrile since admission.  Past Medical History   Past Medical History:  Diagnosis Date  . Atrial flutter ([place])    a. recurrent AFlutter with RVR;  b. Amiodarone Rx started 4/16  . CAD (coronary artery disease)    a. LHC 1/16:  mLAD diffuse disease, pLCx mild disease, dLCx with disease but too small for PCI, RCA ok, EF 25-30%  . Chronic pain    . Chronic systolic CHF (congestive heart failure) ([place])   . COPD (chronic obstructive pulmonary disease) ([place])   . Diabetes mellitus without complication ([place])   . Hypercholesteremia   . Hypertension   . NICM (nonischemic cardiomyopathy) ([place])    a.dx 2016. b. 2D echo 06/2016 - Last echo 07/01/16: mod dilated LV, mod LVH, EF 25-30%, mild-mod MR, sev LAE, mild-mod reduced RV systolic function, mild-mod TR, PASP 40mHG.  .Marland KitchenPAF (paroxysmal atrial fibrillation) (HCC)    On amio - ot a candidate for flecainide due to cardiomyopathy, not a candidate for Tikosyn due to prolonged QT, and felt to be a poor candidate for ablation given left atrial size.  . Pulmonary hypertension ([place]   . Tobacco abuse      Significant Hospital Events   10/6 Admitted to Internal Medicine  Consults:  PCCM  Procedures:  none  Significant Diagnostic Tests:  10/6 CXR>>Congestive heart failure, Patchy consolidation of bilateral lung bases, Bilateral pleural effusions. 10/6 CTA chest>>large right pleural effusion with complete collapse of the right lower lobe. Progressive partial collapse of the left lower lobe.  Micro Data:  10/6 Blood Culture>> 10/6 Sars-CoV-2>> neg  Antimicrobials:  Ceftriaxone 10/6> Vanc 10/6 only Zosyn 10/6 only Interim history/subjective:  Tolerating Bipap, pt sleeping but awakes to voice and follows commands, answers questions.  He denies CP, states his breathing feels "better"  Objective   Blood pressure (!) 115/59, pulse  77, temperature 98.8 F (37.1 C), temperature source Oral, resp. rate 18, SpO2 91 %.    FiO2 (%):  [40 %] 40 %   Intake/Output Summary (Last 24 hours) at 08/10/2019 1023 Last data filed at 08/10/2019 0951 Gross per 24 hour  Intake 1050 ml  Output 2450 ml  Net -1400 ml   There were no vitals filed for this visit. General:  Obese, somnolent M on bipap, non-toxic appearing and no distress HEENT: MM pink/moist Neuro: Sleepy, but awakes to voice, nods head  and gives short answers, moving all extremities, oriented to person and place CV: s1s2 rrr, no m/r/g PULM:  bipap in place, course breath sounds throughout with decreased air movement of the RLL,  rhonchi in the RML and LLL GI: soft, bsx4 active  Extremities: warm/dry, compression stockings in place, 1+ pedal edema bilaterally   Skin: no rashes or lesions   Resolved Hospital Problem list     Assessment & Plan:   Acute on chronic hypercarbic and hypoxic respiratory failure -likely multi-factorial and secondary to to chronic restrictive lung disease and COPD exacerbation, tobacco use and pulmonary HTN, HFpEF, and pleural effusion P: -transfer to intensive care, consider thoracentesis -continue Bipap and titrate to maintain O2 88-94%, steroids, bronchodilators and ceftriaxone/azithrymycin -start Mucomyst -Incentive spirometer   HTN, HFpEF, ischemic cardiomyopathy -last Echo 07/2019 with hyperdynamic systolic function and EF >69% P: -Continue Lasix 22m q6hr, metoprolol  -consider repeat echo if signs of worsening HF    Hypokalemia -likely secondary to diuresis -mag 2.4 yesterday P: -replace and follow    Acute on chronic renal failure -creatinine 2.19 this morning  Recent baseline 1.5-1.8 -possible secondary to congestion from volume overload P: -continue diuresis, Lasix and avoiding nephrotoxic medications -repeat BMET tomorrow         Best practice:  Diet: NPO Pain/Anxiety/Delirium protocol (if indicated): avoiding unless necessary in order to avoid increased somnolence VAP protocol (if indicated): n/a DVT prophylaxis: heparin GI prophylaxis: protonix  Glucose control: SSI Mobility: bed rest Code Status: Full Family Communication: will attempt to update pt's spouse Disposition: ICU  Labs   CBC: Recent Labs  Lab 08/09/19 1441 08/09/19 1637 08/09/19 1830 08/10/19 0423 08/10/19 0443  WBC 6.1  --  6.5  --  2.6*  NEUTROABS 4.9  --   --   --  2.2  HGB  10.6* 13.3 10.4* 11.9* 9.4*  HCT 34.4* 39.0 34.2* 35.0* 32.7*  MCV 87.3  --  88.4  --  89.3  PLT 213  --  203  --  2629   Basic Metabolic Panel: Recent Labs  Lab 08/09/19 1441 08/09/19 1600 08/09/19 1637 08/10/19 0423 08/10/19 0443  NA 135  --  132* 134* 139  K 2.5*  --  4.0 3.1* 3.2*  CL 78*  --   --   --  80*  CO2 39*  --   --   --  37*  GLUCOSE 84  --   --   --  137*  BUN 80*  --   --   --  77*  CREATININE 2.25*  --   --   --  2.19*  CALCIUM 8.9  --   --   --  8.8*  MG  --  2.4  --   --   --    GFR: Estimated Creatinine Clearance: 40.5 mL/min (A) (by C-G formula based on SCr of 2.19 mg/dL (H)). Recent Labs  Lab 08/09/19 1441 08/09/19 1510 08/09/19 1830 08/09/19 1950  08/10/19 0443  PROCALCITON 0.47  --   --   --   --   WBC 6.1  --  6.5  --  2.6*  LATICACIDVEN  --  1.5  --  1.3  --     Liver Function Tests: Recent Labs  Lab 08/09/19 1441 08/10/19 0443  AST 20 16  ALT 16 16  ALKPHOS 130* 112  BILITOT 1.2 1.5*  PROT 6.7 6.2*  ALBUMIN 3.5 3.1*   No results for input(s): LIPASE, AMYLASE in the last 168 hours. No results for input(s): AMMONIA in the last 168 hours.  ABG    Component Value Date/Time   PHART 7.370 08/10/2019 0423   PCO2ART 75.8 (HH) 08/10/2019 0423   PO2ART 67.0 (L) 08/10/2019 0423   HCO3 43.9 (H) 08/10/2019 0423   TCO2 46 (H) 08/10/2019 0423   ACIDBASEDEF 0.5 04/09/2019 2005   O2SAT 91.0 08/10/2019 0423     Coagulation Profile: No results for input(s): INR, PROTIME in the last 168 hours.  Cardiac Enzymes: No results for input(s): CKTOTAL, CKMB, CKMBINDEX, TROPONINI in the last 168 hours.  HbA1C: Hgb A1c MFr Bld  Date/Time Value Ref Range Status  08/09/2019 06:30 PM 7.2 (H) 4.8 - 5.6 % Final    Comment:    (NOTE) Pre diabetes:          5.7%-6.4% Diabetes:              >6.4% Glycemic control for   <7.0% adults with diabetes   03/22/2019 04:19 AM 6.8 (H) 4.8 - 5.6 % Final    Comment:    (NOTE) Pre diabetes:           5.7%-6.4% Diabetes:              >6.4% Glycemic control for   <7.0% adults with diabetes     CBG: Recent Labs  Lab 08/09/19 2111 08/10/19 0936  GLUCAP 106* 169*    Review of Systems:   Negative except as noted in HPI  Past Medical History  He,  has a past medical history of Atrial flutter ([place]), CAD (coronary artery disease), Chronic pain, Chronic systolic CHF (congestive heart failure) ([place]), COPD (chronic obstructive pulmonary disease) ([place]), Diabetes mellitus without complication ([place]), Hypercholesteremia, Hypertension, NICM (nonischemic cardiomyopathy) ([place]), PAF (paroxysmal atrial fibrillation) ([place]), Pulmonary hypertension ([place]), and Tobacco abuse.   Surgical History    Past Surgical History:  Procedure Laterality Date  . BIOPSY  03/11/2019   Procedure: BIOPSY;  Surgeon: Lavena Bullion, DO;  Location: [place];  Service: Gastroenterology;;  . BIOPSY  03/15/2019   Procedure: BIOPSY;  Surgeon: Irving Copas., MD;  Location: [place];  Service: Gastroenterology;;  . CARDIOVERSION N/A 07/30/2018   Procedure: CARDIOVERSION;  Surgeon: Larey Dresser, MD;  Location: [facility] ENDOSCOPY;  Service: Cardiovascular;  Laterality: N/A;  . COLONOSCOPY N/A 03/11/2019   Procedure: COLONOSCOPY;  Surgeon: Lavena Bullion, DO;  Location: [place];  Service: Gastroenterology;  Laterality: N/A;  . ENTEROSCOPY N/A 03/15/2019   Procedure: ENTEROSCOPY;  Surgeon: Rush Landmark Telford Nab., MD;  Location: [place];  Service: Gastroenterology;  Laterality: N/A;  . ESOPHAGOGASTRODUODENOSCOPY Left 03/11/2019   Procedure: ESOPHAGOGASTRODUODENOSCOPY (EGD);  Surgeon: Lavena Bullion, DO;  Location[facility] END[facility]COPY;  Service: Gastroenterology;  Laterality: Left;  . LEFT AND RIGHT HEART CATHETERIZATION WITH CORONARY ANGIOGRAM N/A 11/06/2014   Procedure: LEFT AND RIGHT HEART CATHETERIZATION WITH CORONARY ANGIOGRAM;  Surgeon: Jettie Booze, MD;  Location: [facility] CATH LAB;  Service:  Cardiovascular;  Laterality:  N/A;  . RIGHT/LEFT HEART CATH AND CORONARY ANGIOGRAPHY N/A 12/06/2018   Procedure: RIGHT/LEFT HEART CATH AND CORONARY ANGIOGRAPHY;  Surgeon: Larey Dresser, MD;  Location: [place] CV LAB;  Service: Cardiovascular;  Laterality: N/A;  . SPLENECTOMY    . SUBMUCOSAL TATTOO INJECTION  03/15/2019   Procedure: SUBMUCOSAL TATTOO INJECTION;  Surgeon: Rush Landmark Telford Nab., MD;  Location: [place];  Service: Gastroenterology;;     Social History   reports that he quit smoking about 8 months ago. His smoking use included cigarettes. He has a 34.00 pack-year smoking history. He has never used smokeless tobacco. He reports that he does not drink alcohol or use drugs.   Family History   His family history includes Heart disease in his father and mother; Heart failure in his mother; Hypertension in his mother and sister. There is no history of Heart attack, Stroke, Colon cancer, or Esophageal cancer.   Allergies No Known Allergies   Home Medications  Prior to Admission medications   Medication Sig Start Date End Date Taking? Authorizing Provider  albuterol (VENTOLIN HFA) 108 (90 Base) MCG/ACT inhaler Inhale 2 puffs into the lungs every 6 (six) hours as needed for wheezing or shortness of breath.   Yes [provider]  apixaban (ELIQUIS) 5 MG TABS tablet Take 1 tablet (5 mg total) by mouth 2 (two) times daily. 03/31/19  Yes Kayleen Memos, DO  aspirin EC 81 MG EC tablet Take 1 tablet (81 mg total) by mouth daily. 06/14/19  Yes Swayze, Ava, DO  bisoprolol (ZEBETA) 5 MG tablet Take 1 tablet (5 mg total) by mouth daily. Patient taking differently: Take 5 mg by mouth at bedtime.  08/02/19  Yes Clegg, Amy D, NP  budesonide-formoterol (SYMBICORT) 160-4.5 MCG/ACT inhaler Inhale 2 puffs into the lungs as needed (shortness of breath).    Yes [provider]  ferrous sulfate 325 (65 FE) MG tablet Take 325 mg by mouth 2 (two) times daily.   Yes [provider]  fluticasone (FLONASE) 50 MCG/ACT nasal spray Place 2 sprays into both nostrils daily as needed for allergies.    Yes [provider]  gabapentin (NEURONTIN) 300 MG capsule Take 1 capsule (300 mg total) by mouth 2 (two) times daily. 06/13/19  Yes Swayze, Ava, DO  hydrocortisone (ANUSOL-HC) 2.5 % rectal cream Place 1 application rectally 2 (two) times daily as needed for hemorrhoids or itching.    Yes [provider]  hydrocortisone 1 % lotion Apply 1 application topically See admin instructions. Apply small amount to back twice daily for itchy rash as needed   Yes [provider]  insulin aspart (NOVOLOG) 100 UNIT/ML injection Inject 4 Units into the skin 3 (three) times daily with meals. 03/31/19  Yes Hall, Carole N, DO  insulin aspart protamine - aspart (NOVOLOG MIX 70/30 FLEXPEN) (70-30) 100 UNIT/ML FlexPen Inject 0.1 mLs (10 Units total) into the skin 2 (two) times daily. 03/31/19  Yes Hall, Carole N, DO  ipratropium-albuterol (DUONEB) 0.5-2.5 (3) MG/3ML SOLN Take 3 mLs by nebulization 2 (two) times daily as needed (shortness of breath/wheezing).    Yes [provider]  isosorbide-hydrALAZINE (BIDIL) 20-37.5 MG tablet Take 0.5 tablets by mouth 3 (three) times daily. 05/18/19  Yes Larey Dresser, MD  metolazone (ZAROXOLYN) 2.5 MG tablet Take 1 tablet (2.5 mg total) by mouth 4 (four) times a week. Take extra Potassium 17mq when taking this medication 06/30/19  Yes MLarey Dresser MD  oxybutynin (DITROPAN) 5 MG  tablet Take 1 tablet (5 mg total) by mouth 4 (four) times daily. 06/13/19  Yes Swayze, Ava, DO  oxyCODONE-acetaminophen (PERCOCET) 10-325 MG tablet Take 1 tablet by mouth every 6 (six) hours as needed for pain.  05/16/19  Yes [provider]  OXYGEN Inhale 3 L into the lungs continuous.    Yes [provider]  pantoprazole (PROTONIX) 40 MG tablet Take 1 tablet (40 mg total) by mouth 2 (two) times daily. 05/05/19  Yes Clegg, Amy D,  NP  polyethylene glycol (MIRALAX / GLYCOLAX) packet Take 17 g by mouth daily. 01/24/19  Yes Angiulli, Lavon Paganini, PA-C  potassium chloride SA (K-DUR) 20 MEQ tablet Take 3 tablets (60 mEq total) by mouth 2 (two) times daily. With additional 20mq when you take Metolazone. 06/13/19  Yes Swayze, Ava, DO  PRESCRIPTION MEDICATION Cpap   Yes [provider]  rosuvastatin (CRESTOR) 20 MG tablet Take 1 tablet (20 mg total) by mouth daily at 6 PM. 05/18/19  Yes MLarey Dresser MD  sodium chloride (OCEAN) 0.65 % SOLN nasal spray Place 2 sprays into both nostrils as needed for congestion.    Yes [provider]  tamsulosin (FLOMAX) 0.4 MG CAPS capsule Take 1 capsule (0.4 mg total) by mouth daily after supper. 04/20/19  Yes Clegg, Amy D, NP  torsemide (DEMADEX) 20 MG tablet Take 4 tablets (80 mg total) by mouth 2 (two) times daily. 06/23/19  Yes MLarey Dresser MD     LOtilio CarpenGleason, PA-C [facility]  Pager# [phone] if no answer #[phone]

## 2019-08-10 NOTE — ED Notes (Signed)
No changes noted at this time.

## 2019-08-10 NOTE — Progress Notes (Signed)
ANTICOAGULATION CONSULT NOTE - Initial Consult  Pharmacy Consult for warfarin Indication: atrial fibrillation  Heparin dosing weight: 102 kg  Vital Signs: BP: 115/59 (10/07 0915) Pulse Rate: 77 (10/07 0915)  Labs: Recent Labs    08/09/19 1441 08/09/19 1600  08/09/19 1830 08/10/19 0423 08/10/19 0443  HGB 10.6*  --    < > 10.4* 11.9* 9.4*  HCT 34.4*  --    < > 34.2* 35.0* 32.7*  PLT 213  --   --  203  --  220  CREATININE 2.25*  --   --   --   --  2.19*  TROPONINIHS 60* 61*  --   --   --   --    < > = values in this interval not displayed.    Estimated Creatinine Clearance: 40.5 mL/min (A) (by C-G formula based on SCr of 2.19 mg/dL (H)).   Medical History: Past Medical History:  Diagnosis Date  . Atrial flutter ([place])    a. recurrent AFlutter with RVR;  b. Amiodarone Rx started 4/16  . CAD (coronary artery disease)    a. LHC 1/16:  mLAD diffuse disease, pLCx mild disease, dLCx with disease but too small for PCI, RCA ok, EF 25-30%  . Chronic pain   . Chronic systolic CHF (congestive heart failure) ([place])   . COPD (chronic obstructive pulmonary disease) ([place])   . Diabetes mellitus without complication ([place])   . Hypercholesteremia   . Hypertension   . NICM (nonischemic cardiomyopathy) ([place])    a.dx 2016. b. 2D echo 06/2016 - Last echo 07/01/16: mod dilated LV, mod LVH, EF 25-30%, mild-mod MR, sev LAE, mild-mod reduced RV systolic function, mild-mod TR, PASP 72mHG.  .Marland KitchenPAF (paroxysmal atrial fibrillation) (HCC)    On amio - ot a candidate for flecainide due to cardiomyopathy, not a candidate for Tikosyn due to prolonged QT, and felt to be a poor candidate for ablation given left atrial size.  . Pulmonary hypertension ([place]   . Tobacco abuse      Assessment: 73yo male on Eliquis PTA for AFib. His last dose was yesterday morning. He was admitted for SOB and cough. He is Covid-19 negative. His d-dimer was slightly elevated, he had a CTA of the chest which ruled out a PE but  did show a pleural effusion. He is being transitioned to heparin while undergoing workup. CBC wnl, SCr 2.1.   Goal of Therapy:  APTT 66-102 Heparin level 0.3-0.7 units/ml Monitor platelets by anticoagulation protocol: Yes    Plan:  -Heparin infusion at 1450 units/hr -Daily HL, CBC, aPTT -Baseline aPTT ordered, will check another aPTT this afternoon   MHughes BetterM 08/10/2019,10:16 AM

## 2019-08-10 NOTE — ED Notes (Signed)
Attempt to call report x 1

## 2019-08-10 NOTE — Progress Notes (Signed)
ANTICOAGULATION CONSULT NOTE  Pharmacy Consult for heparin Indication: atrial fibrillation  Heparin dosing weight: 102 kg  Vital Signs: Temp: 98.8 F (37.1 C) (10/07 1925) Temp Source: Oral (10/07 1925) BP: 134/49 (10/07 2000) Pulse Rate: 84 (10/07 2000)  Labs: Recent Labs    08/09/19 1441 08/09/19 1600  08/09/19 1830 08/10/19 0423 08/10/19 0443 08/10/19 1028 08/10/19 1945  HGB 10.6*  --    < > 10.4* 11.9* 9.4*  --   --   HCT 34.4*  --    < > 34.2* 35.0* 32.7*  --   --   PLT 213  --   --  203  --  220  --   --   APTT  --   --   --   --   --   --  32 109*  CREATININE 2.25*  --   --   --   --  2.19*  --   --   TROPONINIHS 60* 61*  --   --   --   --   --   --    < > = values in this interval not displayed.    CrCl cannot be calculated (Unknown ideal weight.).   Medical History: Past Medical History:  Diagnosis Date  . Atrial flutter ([place])    a. recurrent AFlutter with RVR;  b. Amiodarone Rx started 4/16  . CAD (coronary artery disease)    a. LHC 1/16:  mLAD diffuse disease, pLCx mild disease, dLCx with disease but too small for PCI, RCA ok, EF 25-30%  . Chronic pain   . Chronic systolic CHF (congestive heart failure) ([place])   . COPD (chronic obstructive pulmonary disease) ([place])   . Diabetes mellitus without complication ([place])   . Hypercholesteremia   . Hypertension   . NICM (nonischemic cardiomyopathy) ([place])    a.dx 2016. b. 2D echo 06/2016 - Last echo 07/01/16: mod dilated LV, mod LVH, EF 25-30%, mild-mod MR, sev LAE, mild-mod reduced RV systolic function, mild-mod TR, PASP 21mHG.  .Marland KitchenPAF (paroxysmal atrial fibrillation) (HCC)    On amio - ot a candidate for flecainide due to cardiomyopathy, not a candidate for Tikosyn due to prolonged QT, and felt to be a poor candidate for ablation given left atrial size.  . Pulmonary hypertension ([place]   . Tobacco abuse      Assessment: 73yo male on Eliquis PTA for AFib. His last dose was yesterday morning. He was admitted  for SOB and cough. He is Covid-19 negative. His d-dimer was slightly elevated, he had a CTA of the chest which ruled out a PE but did show a pleural effusion. He is being transitioned to heparin while undergoing workup. CBC wnl, SCr 2.1.  Initial aPTT slightly supratherapeutic at 109 sec  Goal of Therapy:  APTT 66-102 Heparin level 0.3-0.7 units/ml Monitor platelets by anticoagulation protocol: Yes    Plan:  Decrease heparin gtt to 1350 units/hr F/u aPTT 8h  JBertis Ruddy PharmD Clinical Pharmacist Please check AMION for all MWeedvillenumbers 08/10/2019 8:48 PM

## 2019-08-10 NOTE — ED Notes (Signed)
ED TO INPATIENT HANDOFF REPORT  ED Nurse Name and Phone #: Celene Squibb  S Name/Age/Gender [name] 73 y.o. male Room/Bed: 032C/032C  Code Status   Code Status: Full Code  Home/SNF/Other Home Patient oriented to: self and place Is this baseline? No   Triage Complete: Triage complete  Chief Complaint sob  Triage Note I received this patient in triage on a stretcher after pt had been received in trauma B from [facility] and then another rn had taken this pt to endo for a procedure but it was determined that due to pts new onset of sob( which is why he called ems) he could not have his procedure at this time and would need to go back to the ER. I did not receive report from [facility]. Pt tells me he called out due to feeling weak and having trouble breathing. Pt normally wears 3L of o2 but currently have him at 8L [place] and sats at 93%-pt has labored breathing and use of accessory muscles to breathe. Charge aware pt needs a treatment room stat.    Allergies No Known Allergies  Level of Care/Admitting Diagnosis ED Disposition    ED Disposition Condition [facility] Area: [place] [[id number]]  Level of Care: ICU [6]  Covid Evaluation: N/A  Diagnosis: Acute on chronic respiratory failure with hypoxia and hypercapnia [facility]) [[id number]]  Admitting Physician: Chesley Mires [[id number]]  Attending Physician: Chesley Mires [[id number]]  Estimated length of stay: 5 - 7 days  Certification:: I certify this patient will need inpatient services for at least 2 midnights  PT Class (Do Not Modify): Inpatient [101]  PT Acc Code (Do Not Modify): Private [1]       B Medical/Surgery History Past Medical History:  Diagnosis Date  . Atrial flutter ([facility])    a. recurrent AFlutter with RVR;  b. Amiodarone Rx started 4/16  . CAD (coronary artery disease)    a. LHC 1/16:  mLAD diffuse disease, pLCx mild disease, dLCx with disease but too small for PCI, RCA ok, EF 25-30%  . Chronic  pain   . Chronic systolic CHF (congestive heart failure) ([place])   . COPD (chronic obstructive pulmonary disease) ([place])   . Diabetes mellitus without complication ([place])   . Hypercholesteremia   . Hypertension   . NICM (nonischemic cardiomyopathy) ([place])    a.dx 2016. b. 2D echo 06/2016 - Last echo 07/01/16: mod dilated LV, mod LVH, EF 25-30%, mild-mod MR, sev LAE, mild-mod reduced RV systolic function, mild-mod TR, PASP 75mHG.  .Marland KitchenPAF (paroxysmal atrial fibrillation) ([facility])    On amio - ot a candidate for flecainide due to cardiomyopathy, not a candidate for Tikosyn due to prolonged QT, and felt to be a poor candidate for ablation given left atrial size.  . Pulmonary hypertension ([facility]   . Tobacco abuse    Past Surgical History:  Procedure Laterality Date  . BIOPSY  03/11/2019   Procedure: BIOPSY;  Surgeon: CLavena Bullion DO;  Location: [facility]  Service: Gastroenterology;;  . BIOPSY  03/15/2019   Procedure: BIOPSY;  Surgeon: MIrving Copas, MD;  [facility] [facility]  Service: Gastroenterology;;  . CARDIOVERSION N/A 07/30/2018   Procedure: CARDIOVERSION;  Surgeon: MLarey Dresser MD;  [facility];  Service: Cardiovascular;  Laterality: N/A;  . COLONOSCOPY N/A 03/11/2019   Procedure: COLONOSCOPY;  Surgeon: CLavena Bullion DO;  Location: [facility]  Service: Gastroenterology;  Laterality: N/A;  . ENTEROSCOPY N/A 03/15/2019  Procedure: ENTEROSCOPY;  Surgeon: Rush Landmark Telford Nab., MD;  Location: [facility];  Service: Gastroenterology;  Laterality: N/A;  . ESOPHAGOGASTRODUODENOSCOPY Left 03/11/2019   Procedure: ESOPHAGOGASTRODUODENOSCOPY (EGD);  Surgeon: Lavena Bullion, DO;  Location: [facility] ENDOSCOPY;  Service: Gastroenterology;  Laterality: Left;  . LEFT AND RIGHT HEART CATHETERIZATION WITH CORONARY ANGIOGRAM N/A 11/06/2014   Procedure: LEFT AND RIGHT HEART CATHETERIZATION WITH CORONARY ANGIOGRAM;  Surgeon: Jettie Booze, MD;  Location: [facility] LAB;   Service: Cardiovascular;  Laterality: N/A;  . RIGHT/LEFT HEART CATH AND CORONARY ANGIOGRAPHY N/A 12/06/2018   Procedure: RIGHT/LEFT HEART CATH AND CORONARY ANGIOGRAPHY;  Surgeon: Larey Dresser, MD;  Location: [facility] CV LAB;  Service: Cardiovascular;  Laterality: N/A;  . SPLENECTOMY    . SUBMUCOSAL TATTOO INJECTION  03/15/2019   Procedure: SUBMUCOSAL TATTOO INJECTION;  Surgeon: Rush Landmark Telford Nab., MD;  Location: [facility];  Service: Gastroenterology;;     A IV Location/Drains/Wounds Patient Lines/Drains/Airways Status   Active Line/Drains/Airways    Name:   Placement date:   Placement time:   Site:   Days:   Peripheral IV 08/09/19 Right Hand   08/09/19    1500    Hand   1   Peripheral IV 08/09/19 Left Forearm   08/09/19    2007    Forearm   1   Pressure Injury 11/22/18 Stage I -  Intact skin with non-blanchable redness of a localized area usually over a bony prominence. healing pink   11/22/18    0230     261   Wound / Incision (Open or Dehisced) 07/14/19 Other (Comment) Toe (Comment  which one) Right open wound   07/14/19    1000    Toe (Comment  which one)   27          Intake/Output Last 24 hours  Intake/Output Summary (Last 24 hours) at 08/10/2019 1516 Last data filed at 08/10/2019 1411 Gross per 24 hour  Intake 1150 ml  Output 2450 ml  Net -1300 ml    Labs/Imaging Results for orders placed or performed during the hospital encounter of 08/09/19 (from the past 48 hour(s))  CBC WITH DIFFERENTIAL     Status: Abnormal   Collection Time: 08/09/19  2:41 PM  Result Value Ref Range   WBC 6.1 4.0 - 10.5 K/uL   RBC 3.94 (L) 4.22 - 5.81 MIL/uL   Hemoglobin 10.6 (L) 13.0 - 17.0 g/dL   HCT 34.4 (L) 39.0 - 52.0 %   MCV 87.3 80.0 - 100.0 fL   MCH 26.9 26.0 - 34.0 pg   MCHC 30.8 30.0 - 36.0 g/dL   RDW 21.8 (H) 11.5 - 15.5 %   Platelets 213 150 - 400 K/uL   nRBC 0.3 (H) 0.0 - 0.2 %   Neutrophils Relative % 80 %   Neutro Abs 4.9 1.7 - 7.7 K/uL   Lymphocytes Relative 7 %    Lymphs Abs 0.4 (L) 0.7 - 4.0 K/uL   Monocytes Relative 11 %   Monocytes Absolute 0.7 0.1 - 1.0 K/uL   Eosinophils Relative 1 %   Eosinophils Absolute 0.0 0.0 - 0.5 K/uL   Basophils Relative 0 %   Basophils Absolute 0.0 0.0 - 0.1 K/uL   Immature Granulocytes 1 %   Abs Immature Granulocytes 0.03 0.00 - 0.07 K/uL    Comment: Performed at [facility], [address], [place], [place] [address]  Comprehensive metabolic panel     Status: Abnormal   Collection Time: 08/09/19  2:41 PM  Result Value Ref Range   Sodium 135 135 - 145 mmol/L   Potassium 2.5 (LL) 3.5 - 5.1 mmol/L    Comment: CRITICAL RESULT CALLED TO, READ BACK BY AND VERIFIED WITH: E.ATKINS RN 1528 08/09/2019 MCCORMICK K    Chloride 78 (L) 98 - 111 mmol/L   CO2 39 (H) 22 - 32 mmol/L   Glucose, Bld 84 70 - 99 mg/dL   BUN 80 (H) 8 - 23 mg/dL   Creatinine, Ser 2.25 (H) 0.61 - 1.24 mg/dL   Calcium 8.9 8.9 - 10.3 mg/dL   Total Protein 6.7 6.5 - 8.1 g/dL   Albumin 3.5 3.5 - 5.0 g/dL   AST 20 15 - 41 U/L   ALT 16 0 - 44 U/L   Alkaline Phosphatase 130 (H) 38 - 126 U/L   Total Bilirubin 1.2 0.3 - 1.2 mg/dL   GFR calc non Af Amer 28 (L) >60 mL/min   GFR calc Af Amer 33 (L) >60 mL/min   Anion gap 18 (H) 5 - 15    Comment: Performed at [facility] [address], [place], [place] [address]  D-dimer, quantitative     Status: Abnormal   Collection Time: 08/09/19  2:41 PM  Result Value Ref Range   D-Dimer, Quant 0.86 (H) 0.00 - 0.50 ug/mL-FEU    Comment: (NOTE) At the manufacturer cut-off of 0.50 ug/mL FEU, this assay has been documented to exclude PE with a sensitivity and negative predictive value of 97 to 99%.  At this time, this assay has not been approved by the FDA to exclude DVT/VTE. Results should be correlated with clinical presentation. Performed at [facility], [place] [address], [place], [place] [address]   Procalcitonin     Status: None   Collection Time: 08/09/19  2:41 PM  Result Value Ref  Range   Procalcitonin 0.47 ng/mL    Comment:        Interpretation: PCT (Procalcitonin) <= 0.5 ng/mL: Systemic infection (sepsis) is not likely. Local bacterial infection is possible. (NOTE)       Sepsis PCT Algorithm           Lower Respiratory Tract                                      Infection PCT Algorithm    ----------------------------     ----------------------------         PCT < 0.25 ng/mL                PCT < 0.10 ng/mL         Strongly encourage             Strongly discourage   discontinuation of antibiotics    initiation of antibiotics    ----------------------------     -----------------------------       PCT 0.25 - 0.50 ng/mL            PCT 0.10 - 0.25 ng/mL               OR       >80% decrease in PCT            Discourage initiation of  antibiotics      Encourage discontinuation           of antibiotics    ----------------------------     -----------------------------         PCT >= 0.50 ng/mL              PCT 0.26 - 0.50 ng/mL               AND        <80% decrease in PCT             Encourage initiation of                                             antibiotics       Encourage continuation           of antibiotics    ----------------------------     -----------------------------        PCT >= 0.50 ng/mL                  PCT > 0.50 ng/mL               AND         increase in PCT                  Strongly encourage                                      initiation of antibiotics    Strongly encourage escalation           of antibiotics                                     -----------------------------                                           PCT <= 0.25 ng/mL                                                 OR                                        > 80% decrease in PCT                                     Discontinue / Do not initiate                                             antibiotics Performed at [facility], [place] [address], [place], [place] [address]   Lactate dehydrogenase     Status: None   Collection Time: 08/09/19  2:41 PM  Result Value Ref Range   LDH 186 98 - 192 U/L    Comment: Performed at [facility], [place] [address], [place], [place] [address]  Fibrinogen     Status: None   Collection Time: 08/09/19  2:41 PM  Result Value Ref Range   Fibrinogen 412 210 - 475 mg/dL    Comment: Performed at [facility] [address] [place], [place] [address]  Brain natriuretic peptide     Status: Abnormal   Collection Time: 08/09/19  2:41 PM  Result Value Ref Range   B Natriuretic Peptide 2,698.3 (H) 0.0 - 100.0 pg/mL    Comment: Performed at [facility] [address], [place], [place] [address]  Troponin I (High Sensitivity)     Status: Abnormal   Collection Time: 08/09/19  2:41 PM  Result Value Ref Range   Troponin I (High Sensitivity) 60 (H) <18 ng/L    Comment: (NOTE) Elevated high sensitivity troponin I (hsTnI) values and significant  changes across serial measurements may suggest ACS but many other  chronic and acute conditions are known to elevate hsTnI results.  Refer to the Links section for chest pain algorithms and additional  guidance. Performed at [facility], [place] [address], [place], [place] [address]   Triglycerides     Status: None   Collection Time: 08/09/19  2:41 PM  Result Value Ref Range   Triglycerides 97 <150 mg/dL    Comment: Performed at [facility], [place] [address], [place], [place] [address]  C-reactive protein     Status: None   Collection Time: 08/09/19  2:50 PM  Result Value Ref Range   CRP <0.8 <1.0 mg/dL    Comment: Performed at [facility], [place] [address] [place], [place] [address]  Ferritin     Status: None   Collection Time: 08/09/19  2:50 PM  Result Value Ref Range   Ferritin 35 24 - 336 ng/mL    Comment: Performed at [facility] [address], [place], [place] [address]  Lactic acid,  plasma     Status: None   Collection Time: 08/09/19  3:10 PM  Result Value Ref Range   Lactic Acid, Venous 1.5 0.5 - 1.9 mmol/L    Comment: Performed at [facility] [address], [place],  [address]  Blood Culture (routine x 2)     Status: None (Preliminary result)   Collection Time: 08/09/19  3:10 PM   Specimen: BLOOD RIGHT HAND  Result Value Ref Range   Specimen Description BLOOD RIGHT HAND    Special Requests      BOTTLES DRAWN AEROBIC AND ANAEROBIC Blood Culture adequate volume   Culture      NO GROWTH < 24 HOURS Performed at [facility], [place] [address], [place],  [address]    Report Status PENDING   SARS Coronavirus 2 [facility] order, Performed in [facility] hospital lab) Nasopharyngeal Nasopharyngeal Swab     Status: None   Collection Time: 08/09/19  3:27 PM   Specimen: Nasopharyngeal Swab  Result Value Ref Range   SARS Coronavirus 2 NEGATIVE NEGATIVE    Comment: (NOTE) If result is NEGATIVE SARS-CoV-2 target nucleic acids are NOT DETECTED. The SARS-CoV-2 RNA is generally detectable in upper and lower  respiratory specimens during the acute phase of infection. The lowest  concentration of SARS-CoV-2 viral copies this assay can detect is 250  copies / mL. A negative result does not preclude SARS-CoV-2 infection  and should not be used as the sole basis for treatment or other  patient management decisions.  A negative result may occur with  improper specimen collection / handling, submission of specimen other  than nasopharyngeal swab, presence of viral mutation(s) within the  areas targeted by this assay, and inadequate number of viral copies  (<250 copies / mL). A negative result must be combined with clinical  observations, patient history, and epidemiological information. If result is POSITIVE SARS-CoV-2 target nucleic acids are DETECTED. The SARS-CoV-2 RNA is generally detectable in upper and lower  respiratory specimens dur ing the  acute phase of infection.  Positive  results are indicative of active infection with SARS-CoV-2.  Clinical  correlation with patient history and other diagnostic information is  necessary to determine patient infection status.  Positive results do  not rule out bacterial infection or co-infection with other viruses. If result is PRESUMPTIVE POSTIVE SARS-CoV-2 nucleic acids MAY BE PRESENT.   A presumptive positive result was obtained on the submitted specimen  and confirmed on repeat testing.  While 2019 novel coronavirus  (SARS-CoV-2) nucleic acids may be present in the submitted sample  additional confirmatory testing may be necessary for epidemiological  and / or clinical management purposes  to differentiate between  SARS-CoV-2 and other Sarbecovirus currently known to infect humans.  If clinically indicated additional testing with an alternate test  methodology [phone]) is advised. The SARS-CoV-2 RNA is generally  detectable in upper and lower respiratory sp ecimens during the acute  phase of infection. The expected result is Negative. Fact Sheet for Patients:  StrictlyIdeas.no Fact Sheet for Healthcare Providers: BankingDealers.co.za This test is not yet approved or cleared by the [place] FDA and has been authorized for detection and/or diagnosis of SARS-CoV-2 by FDA under an Emergency Use Authorization (EUA).  This EUA will remain in effect (meaning this test can be used) for the duration of the COVID-19 declaration under Section 564(b)(1) of the Act, 21 U.S.C. section 360bbb-3(b)(1), unless the authorization is terminated or revoked sooner. Performed at [facility], [place] [address] [place], [place] [address]   Blood Culture (routine x 2)     Status: None (Preliminary result)   Collection Time: 08/09/19  3:30 PM   Specimen: BLOOD RIGHT HAND  Result Value Ref Range   Specimen Description BLOOD RIGHT HAND    Special  Requests      BOTTLES DRAWN AEROBIC ONLY Blood Culture results may not be optimal due to an inadequate volume of blood received in culture bottles   Culture      NO GROWTH < 24 HOURS Performed at [facility] [address] [place], [place] [address]    Report Status PENDING   Magnesium     Status: None   Collection Time: 08/09/19  4:00 PM  Result Value Ref Range   Magnesium 2.4 1.7 - 2.4 mg/dL    Comment: Performed at [facility], [place] [address] [place], [place] [address]  Troponin I (High Sensitivity)     Status: Abnormal   Collection Time: 08/09/19  4:00 PM  Result Value Ref Range   Troponin I (High Sensitivity) 61 (H) <18 ng/L    Comment: (NOTE) Elevated high sensitivity troponin I (hsTnI) values and significant  changes across serial measurements may suggest ACS but many other  chronic and acute conditions are known to elevate hsTnI results.  Refer to  the "Links" section for chest pain algorithms and additional  guidance. Performed at [facility], [place] [address], [place], [place] [address]   POCT I-Stat EG7     Status: Abnormal   Collection Time: 08/09/19  4:37 PM  Result Value Ref Range   pH, Ven 7.366 7.250 - 7.430   pCO2, Ven 87.9 (HH) 44.0 - 60.0 mmHg   pO2, Ven 34.0 32.0 - 45.0 mmHg   Bicarbonate 50.4 (H) 20.0 - 28.0 mmol/L   TCO2 >50 (H) 22 - 32 mmol/L   O2 Saturation 57.0 %   Acid-Base Excess 20.0 (H) 0.0 - 2.0 mmol/L   Sodium 132 (L) 135 - 145 mmol/L   Potassium 4.0 3.5 - 5.1 mmol/L   Calcium, Ion 0.99 (L) 1.15 - 1.40 mmol/L   HCT 39.0 39.0 - 52.0 %   Hemoglobin 13.3 13.0 - 17.0 g/dL   Patient temperature HIDE    Sample type VENOUS    Comment NOTIFIED PHYSICIAN   HIV Antibody (routine testing w rflx)     Status: None   Collection Time: 08/09/19  6:30 PM  Result Value Ref Range   HIV Screen 4th Generation wRfx NON REACTIVE NON REACTIVE    Comment: Performed at [facility], [address], [place], [place] [address]  CBC      Status: Abnormal   Collection Time: 08/09/19  6:30 PM  Result Value Ref Range   WBC 6.5 4.0 - 10.5 K/uL   RBC 3.87 (L) 4.22 - 5.81 MIL/uL   Hemoglobin 10.4 (L) 13.0 - 17.0 g/dL    Comment: REPEATED TO VERIFY   HCT 34.2 (L) 39.0 - 52.0 %   MCV 88.4 80.0 - 100.0 fL   MCH 26.9 26.0 - 34.0 pg   MCHC 30.4 30.0 - 36.0 g/dL   RDW 21.7 (H) 11.5 - 15.5 %   Platelets 203 150 - 400 K/uL   nRBC 0.3 (H) 0.0 - 0.2 %    Comment: Performed at [facility] [address] [place], [place] [address]  Brain natriuretic peptide     Status: Abnormal   Collection Time: 08/09/19  6:30 PM  Result Value Ref Range   B Natriuretic Peptide 2,778.5 (H) 0.0 - 100.0 pg/mL    Comment: Performed at [facility] [address], [place], [place] [address]  Hemoglobin A1c     Status: Abnormal   Collection Time: 08/09/19  6:30 PM  Result Value Ref Range   Hgb A1c MFr Bld 7.2 (H) 4.8 - 5.6 %    Comment: (NOTE) Pre diabetes:          5.7%-6.4% Diabetes:              >6.4% Glycemic control for   <7.0% adults with diabetes    Mean Plasma Glucose 159.94 mg/dL    Comment: Performed at [facility] [address] [place], [place] [address]  Lactic acid, plasma     Status: None   Collection Time: 08/09/19  7:50 PM  Result Value Ref Range   Lactic Acid, Venous 1.3 0.5 - 1.9 mmol/L    Comment: Performed at [facility] [address] [place], [place] [address]  Culture, blood (routine x 2) Call MD if unable to obtain prior to antibiotics being given     Status: None (Preliminary result)   Collection Time: 08/09/19  7:50 PM   Specimen: BLOOD  Result Value Ref Range  Specimen Description BLOOD RIGHT ANTECUBITAL    Special Requests      BOTTLES DRAWN AEROBIC AND ANAEROBIC Blood Culture results may not be optimal due to an inadequate volume of blood received in culture bottles   Culture      NO GROWTH < 24 HOURS Performed at [facility] [address], [place], [place] [address]     Report Status PENDING   Culture, blood (routine x 2) Call MD if unable to obtain prior to antibiotics being given     Status: None (Preliminary result)   Collection Time: 08/09/19  8:10 PM   Specimen: BLOOD RIGHT FOREARM  Result Value Ref Range   Specimen Description BLOOD RIGHT FOREARM    Special Requests      BOTTLES DRAWN AEROBIC AND ANAEROBIC Blood Culture adequate volume   Culture      NO GROWTH < 24 HOURS Performed at [facility], [place] [address] [place], [place] [address]    Report Status PENDING   CBG monitoring, ED     Status: Abnormal   Collection Time: 08/09/19  9:11 PM  Result Value Ref Range   Glucose-Capillary 106 (H) 70 - 99 mg/dL  I-STAT 7, (LYTES, BLD GAS, ICA, H+H)     Status: Abnormal   Collection Time: 08/10/19  4:23 AM  Result Value Ref Range   pH, Arterial 7.370 7.350 - 7.450   pCO2 arterial 75.8 (HH) 32.0 - 48.0 mmHg   pO2, Arterial 67.0 (L) 83.0 - 108.0 mmHg   Bicarbonate 43.9 (H) 20.0 - 28.0 mmol/L   TCO2 46 (H) 22 - 32 mmol/L   O2 Saturation 91.0 %   Acid-Base Excess 15.0 (H) 0.0 - 2.0 mmol/L   Sodium 134 (L) 135 - 145 mmol/L   Potassium 3.1 (L) 3.5 - 5.1 mmol/L   Calcium, Ion 1.11 (L) 1.15 - 1.40 mmol/L   HCT 35.0 (L) 39.0 - 52.0 %   Hemoglobin 11.9 (L) 13.0 - 17.0 g/dL   Patient temperature 98.6 F    Collection site RADIAL, ALLEN'S TEST ACCEPTABLE    Drawn by RT    Sample type ARTERIAL    Comment NOTIFIED PHYSICIAN   Comprehensive metabolic panel     Status: Abnormal   Collection Time: 08/10/19  4:43 AM  Result Value Ref Range   Sodium 139 135 - 145 mmol/L   Potassium 3.2 (L) 3.5 - 5.1 mmol/L   Chloride 80 (L) 98 - 111 mmol/L   CO2 37 (H) 22 - 32 mmol/L   Glucose, Bld 137 (H) 70 - 99 mg/dL   BUN 77 (H) 8 - 23 mg/dL   Creatinine, Ser 2.19 (H) 0.61 - 1.24 mg/dL   Calcium 8.8 (L) 8.9 - 10.3 mg/dL   Total Protein 6.2 (L) 6.5 - 8.1 g/dL   Albumin 3.1 (L) 3.5 - 5.0 g/dL   AST 16 15 - 41 U/L   ALT 16 0 - 44 U/L   Alkaline Phosphatase  112 38 - 126 U/L   Total Bilirubin 1.5 (H) 0.3 - 1.2 mg/dL   GFR calc non Af Amer 29 (L) >60 mL/min   GFR calc Af Amer 34 (L) >60 mL/min   Anion gap 22 (H) 5 - 15    Comment: RESULT CHECKED Performed at [facility], [address] [place], [place] [address]   CBC WITH DIFFERENTIAL     Status: Abnormal   Collection Time: 08/10/19  4:43 AM  Result Value Ref Range  WBC 2.6 (L) 4.0 - 10.5 K/uL   RBC 3.66 (L) 4.22 - 5.81 MIL/uL   Hemoglobin 9.4 (L) 13.0 - 17.0 g/dL    Comment: REPEATED TO VERIFY   HCT 32.7 (L) 39.0 - 52.0 %   MCV 89.3 80.0 - 100.0 fL   MCH 25.7 (L) 26.0 - 34.0 pg   MCHC 28.7 (L) 30.0 - 36.0 g/dL   RDW 21.6 (H) 11.5 - 15.5 %   Platelets 220 150 - 400 K/uL   nRBC 0.0 0.0 - 0.2 %   Neutrophils Relative % 89 %   Neutro Abs 2.2 1.7 - 7.7 K/uL   Lymphocytes Relative 9 %   Lymphs Abs 0.2 (L) 0.7 - 4.0 K/uL   Monocytes Relative 2 %   Monocytes Absolute 0.1 0.1 - 1.0 K/uL   Eosinophils Relative 0 %   Eosinophils Absolute 0.0 0.0 - 0.5 K/uL   Basophils Relative 0 %   Basophils Absolute 0.0 0.0 - 0.1 K/uL   RBC Morphology MORPHOLOGY UNREMARKABLE    Immature Granulocytes 0 %   Abs Immature Granulocytes 0.01 0.00 - 0.07 K/uL    Comment: Performed at [facility], [address], [place], [place] [address]  CBG monitoring, ED     Status: Abnormal   Collection Time: 08/10/19  9:36 AM  Result Value Ref Range   Glucose-Capillary 169 (H) 70 - 99 mg/dL  APTT     Status: None   Collection Time: 08/10/19 10:28 AM  Result Value Ref Range   aPTT 32 24 - 36 seconds    Comment: Performed at [facility], [place] [address], [place], [place] [address]   Ct Angio Chest Pe W Or Wo Contrast  Result Date: 08/10/2019 CLINICAL DATA:  Shortness of breath. EXAM: CT ANGIOGRAPHY CHEST WITH CONTRAST TECHNIQUE: Multidetector CT imaging of the chest was performed using the standard protocol during bolus administration of intravenous contrast. Multiplanar CT image reconstructions  and MIPs were obtained to evaluate the vascular anatomy. CONTRAST:  59m OMNIPAQUE IOHEXOL 350 MG/ML SOLN COMPARISON:  Chest x-ray from same day. CT chest dated January 03, 2019. FINDINGS: Cardiovascular: Suboptimal opacification of the pulmonary arteries. No definite central or lobar pulmonary embolism. Unchanged dilatation of the main pulmonary artery measuring up to 4.0 cm. Unchanged cardiomegaly with prominent right heart enlargement. No pericardial effusion. No thoracic aortic aneurysm or dissection. Coronary, aortic arch, and branch vessel atherosclerotic vascular disease. Mediastinum/Nodes: Borderline and mildly enlarged mediastinal and right hilar lymph nodes are similar to prior study and likely reactive. No enlarged axillary lymph nodes. The thyroid gland, trachea, and esophagus demonstrate no significant findings. Lungs/Pleura: Increased now large right pleural effusion with complete collapse of the right lower lobe. Partial collapse of the left lower lobe. Dependent atelectasis in both upper lobes. No consolidation or pneumothorax. Unchanged centrilobular and paraseptal emphysema. Mild interlobular septal thickening. Scattered bilateral pulmonary nodules measuring up to 8 mm are unchanged. Upper Abdomen: No acute abnormality. Musculoskeletal: No chest wall abnormality. No acute or significant osseous findings. Review of the MIP images confirms the above findings. IMPRESSION: 1. Suboptimal opacification of the pulmonary arteries. No definite central or lobar pulmonary embolism. 2. Increased now large right pleural effusion with complete collapse of the right lower lobe. Progressive partial collapse of the left lower lobe. 3. Unchanged cardiomegaly with evidence of pulmonary arterial hypertension and mild interstitial pulmonary edema. 4. Scattered bilateral pulmonary nodules measuring up to 8 mm are unchanged since March 2020. Non-contrast chest CT at  12-18 months (from today's scan) is considered optional  for low-risk patients, but is recommended for high-risk patients. This recommendation follows the consensus statement: Guidelines for Management of Incidental Pulmonary Nodules Detected on CT Images: From the Fleischner Society 2017; Radiology 2017; 284:228-243. 5.  Emphysema (ICD10-J43.9). 6.  Aortic atherosclerosis (ICD10-I70.0). Electronically Signed   By: Titus Dubin M.D.   On: 08/10/2019 00:11   Dg Chest Port 1 View  Result Date: 08/09/2019 CLINICAL DATA:  Shortness of breath EXAM: PORTABLE CHEST 1 VIEW COMPARISON:  July 18, 2019 FINDINGS: The heart size is enlarged. There is pulmonary edema. Consolidation of bilateral lung bases with bilateral pleural effusions are noted. Bony structures are stable. IMPRESSION: Congestive heart failure. Patchy consolidation of bilateral lung bases, superimposed pneumonia is not excluded. Bilateral pleural effusions. Electronically Signed   By: Abelardo Diesel M.D.   On: 08/09/2019 15:12    Pending Labs Unresulted Labs (From admission, onward)    Start     Ordered   08/11/19 0500  Heparin level (unfractionated)  Daily,   R     08/10/19 1034   08/11/19 0500  CBC  Daily,   R     08/10/19 1034   08/11/19 0500  APTT  Daily,   R     08/10/19 1034   08/11/19 4799  Basic metabolic panel  Tomorrow morning,   R     08/10/19 1043   08/10/19 1900  APTT  Once-Timed,   STAT     08/10/19 1034   08/10/19 0500  Blood gas, arterial  Tomorrow morning,   STAT     08/09/19 1753   08/09/19 1736  HIV4GL Save Tube  (HIV Antibody (Routine testing w reflex) panel)  Once,   STAT     08/09/19 1753          Vitals/Pain Today's Vitals   08/10/19 1215 08/10/19 1300 08/10/19 1345 08/10/19 1348  BP: (!) 116/56 (!) 107/54 (!) 119/56   Pulse: 80 76 80 81  Resp: (!) 27 (!) 25 (!) 26 (!) 26  Temp:      TempSrc:      SpO2: 96% (!) 89% 93% 95%  PainSc:        Isolation Precautions No active isolations  Medications Medications  ipratropium-albuterol (DUONEB)  0.5-2.5 (3) MG/3ML nebulizer solution 3 mL (3 mLs Nebulization Not Given 08/09/19 1921)  albuterol (PROVENTIL) (2.5 MG/3ML) 0.083% nebulizer solution 2.5 mg (has no administration in time range)  ipratropium-albuterol (DUONEB) 0.5-2.5 (3) MG/3ML nebulizer solution 3 mL (3 mLs Nebulization Given 08/10/19 1348)  polyethylene glycol (MIRALAX / GLYCOLAX) packet 17 g (17 g Oral Not Given 08/10/19 1014)  tamsulosin (FLOMAX) capsule 0.4 mg (0.4 mg Oral Not Given 08/09/19 1922)  sodium chloride (OCEAN) 0.65 % nasal spray 2 spray (has no administration in time range)  hydrocortisone (ANUSOL-HC) 2.5 % rectal cream 1 application (1 application Rectal Not Given 08/10/19 1015)  hydrocortisone 1 % lotion 1 application (has no administration in time range)  cefTRIAXone (ROCEPHIN) 1 g in sodium chloride 0.9 % 100 mL IVPB (0 g Intravenous Stopped 08/09/19 2101)  furosemide (LASIX) injection 60 mg (60 mg Intravenous Given 08/10/19 0618)  metoprolol tartrate (LOPRESSOR) injection 5 mg (5 mg Intravenous Not Given 08/10/19 1412)  pantoprazole (PROTONIX) injection 40 mg (has no administration in time range)  heparin ADULT infusion 100 units/mL (25000 units/231m sodium chloride 0.45%) (1,450 Units/hr Intravenous New Bag/Given 08/10/19 1258)  methylPREDNISolone sodium succinate (SOLU-MEDROL) 40 mg/mL injection 40 mg (has no  administration in time range)  acetylcysteine (MUCOMYST) 20 % nebulizer / oral solution 4 mL (4 mLs Nebulization Given 08/10/19 1348)  nicotine (NICODERM CQ - dosed in mg/24 hours) patch 21 mg (has no administration in time range)  methylPREDNISolone sodium succinate (SOLU-MEDROL) 125 mg/2 mL injection 125 mg (125 mg Intravenous Given 08/09/19 1608)  magnesium sulfate IVPB 2 g 50 mL (0 g Intravenous Stopped 08/09/19 1630)  vancomycin (VANCOCIN) 2,000 mg in sodium chloride 0.9 % 500 mL IVPB (0 mg Intravenous Stopped 08/09/19 1921)  piperacillin-tazobactam (ZOSYN) IVPB 3.375 g (0 g Intravenous Stopped 08/09/19 1644)   potassium chloride 10 mEq in 100 mL IVPB (0 mEq Intravenous Stopped 08/09/19 2101)  potassium chloride SA (KLOR-CON) CR tablet 40 mEq (40 mEq Oral Given 08/09/19 1709)  magnesium oxide (MAG-OX) tablet 800 mg (800 mg Oral Given 08/09/19 1704)  calcium gluconate 1 g in sodium chloride 0.9 % 100 mL IVPB (0 g Intravenous Stopped 08/09/19 1958)  iohexol (OMNIPAQUE) 350 MG/ML injection 60 mL (60 mLs Intravenous Contrast Given 08/09/19 2328)  potassium chloride 10 mEq in 100 mL IVPB (0 mEq Intravenous Stopped 08/10/19 1411)    Mobility walks with device High fall risk   Focused Assessments Pulmonary Assessment Handoff:  Lung sounds: Bilateral Breath Sounds: Clear, Diminished L Breath Sounds: Expiratory wheezes R Breath Sounds: Expiratory wheezes O2 Device: Nasal Cannula O2 Flow Rate (L/min): 3 L/min      R Recommendations: See Admitting Provider Note  Report given to:   Additional Notes:

## 2019-08-10 NOTE — ED Notes (Signed)
SDU  Breakfast ordered

## 2019-08-10 NOTE — ED Notes (Signed)
Pt remains alert and able to communicate.  Denies any pain or difficulty breathing.  Maintaining oxygenation with [place] @ 4L

## 2019-08-10 NOTE — Progress Notes (Signed)
Patient states he would like to wear BIPAP tonight, but is going to eat ice cream and talk to wife on the phone first. Told patient and RN to notify RT when ready.

## 2019-08-10 NOTE — ED Notes (Signed)
Lunch tray ordered 

## 2019-08-10 NOTE — ED Notes (Signed)
Pt given an oral swab, per Selinda Eon - RN.

## 2019-08-10 NOTE — Progress Notes (Signed)
PROGRESS NOTE  Nicholas Caldwell RSW:[id number] DOB: [birth year] DOA: 08/09/2019 PCP: [facility]  Brief History   The patient is a 73 yr old man who was scheduled for a TEE and DCCV today for his atrial fibrillation. Oddly, he is in sinus rhythm today. The patient has had a productive cough for 3 days with subjective fevers. He states that he feels that he has been putting on fluid as well. His cough is productive of yellow sputum. He tested negative for COVID-19 2 days ago and again just now.   The patient has a past medical history significant for CAD, Paroxysmal Atrial Fibrillation, Pulmonary hypertension, Non-ischemic cardiomyopathy, Chronic systolic CHF, COPD, Chronic hypoxic respiratory failure, on 2L by [place] chronically at home, hypertension, hyperlipidemia, and chronic pain.  In the ED the patient was found to be hypoxic, hypercarbic, hypokalemic, Hypocalcemic, with a BNP of 2698.3. This was previously of 2064.2 on 08/02/2019, 2211.9 on 07/07/2019, 2300 on 06/08/2019. He has a positive d-dimer.   [facility] were consulted to admit the patient for further evaluation and care. The patient was admitted to step down status, but due to lack of bed availability the patient remained in the ED overnight. He was given diuretics, IV antibiotics, nebulizer treatments, steroids, and was placed on BIPAP overnight.  CTA of the chest was performed to rule out PE as the patient did have a positive D Dimer. It did not show a pulmonary embolus, but did demonstrate a large right sided pleural effusion with collapse of the right lung.  This morning, although the patient is somewhat more awake, he remains somnolent with a PCO2 in the 80's on BIPAP. PCCM has been consulted.  Consultants  . PCCM  Procedures  . None  Antibiotics   Anti-infectives (From admission, onward)   Start     Dose/Rate Route Frequency Ordered Stop   08/09/19 1915  cefTRIAXone (ROCEPHIN) 1 g in sodium chloride 0.9 % 100  mL IVPB     1 g 200 mL/hr over 30 Minutes Intravenous Every 24 hours 08/09/19 1901     08/09/19 1530  vancomycin (VANCOCIN) 2,000 mg in sodium chloride 0.9 % 500 mL IVPB     2,000 mg 250 mL/hr over 120 Minutes Intravenous  Once 08/09/19 1528 08/09/19 1921   08/09/19 1530  piperacillin-tazobactam (ZOSYN) IVPB 3.375 g     3.375 g 100 mL/hr over 30 Minutes Intravenous  Once 08/09/19 1528 08/09/19 1644    .  Subjective  The patient remains somewhat somnolent, although he will arouse and follow commands.   Objective   Vitals:  Vitals:   08/10/19 1058 08/10/19 1130  BP: 123/63 (!) 103/57  Pulse: 79 70  Resp: (!) 22 18  Temp:    SpO2: 91% 93%   Exam:  Constitutional:  . The patient is somnolent, but responsive. No acute distress. Respiratory:  . Diminished breath sounds bilaterally, but especially on the right side. Marland Kitchen Positive for increased work of breathing. . No wheezes, rales, or rhonchi . No tactile fremitus Cardiovascular:  . Regular rate and rhythm . No murmurs, ectopy, or gallups. . No lateral PMI. No thrills. Abdomen:  . Abdomen is soft, non-tender, non-distended . No hernias, masses, or organomegaly . Normoactive bowel sounds.  Musculoskeletal:  . No cyanosisor clubbing. Positive for 3+ edema of lower extremities bilaterally. Skin:  . No rashes, lesions, ulcers . palpation of skin: no induration or nodules Neurologic:  . Grossly normal. Moving all extremities. Psychiatric:  . Unable to evaluate  due to patient's inability to participate in exam.  I have personally reviewed the following:   Today's Data  . Vitals, ABG, CBC, CMP  Imaging  . CTA Chest  Scheduled Meds: . acetylcysteine  4 mL Nebulization BID  . furosemide  60 mg Intravenous Q12H  . hydrocortisone  1 application Rectal BID  . hydrocortisone  1 application Topical See admin instructions  . ipratropium-albuterol  3 mL Nebulization Q6H  . methylPREDNISolone (SOLU-MEDROL) injection  40 mg  Intravenous Q8H  . metoprolol tartrate  5 mg Intravenous Q8H  . pantoprazole (PROTONIX) IV  40 mg Intravenous Daily  . polyethylene glycol  17 g Oral Daily  . tamsulosin  0.4 mg Oral QPC supper   Continuous Infusions: . cefTRIAXone (ROCEPHIN)  IV Stopped (08/09/19 2101)  . heparin    . potassium chloride 10 mEq (08/10/19 1142)    Active Problems:   Essential hypertension   Type 2 diabetes mellitus with neuropathy   COPD exacerbation (HCC)   Dyspnea   Morbid obesity (HCC)   PAF (paroxysmal atrial fibrillation) (HCC)   Prolonged QT interval   Nonischemic cardiomyopathy (HCC)   CAD - Non-obstructive by SUP1/03   Chronic systolic CHF (congestive heart failure) (HCC)   Acute on chronic combined systolic and diastolic CHF (congestive heart failure) (HCC)   Panlobular emphysema (HCC)   Pulmonary hypertension (HCC)   Acute respiratory failure with hypoxia and hypercapnia (HCC)   Acute on chronic respiratory failure with hypoxia and hypercapnia (HCC)   Metabolic encephalopathy   AKI (acute kidney injury) ([place])   Supplemental oxygen dependent   Hypokalemia   Tobacco abuse counseling   LOS: 1 day   A & P   Acute on chronic hypoxic and hypercapneic respiratory failure: Secondary to COPD exacerbation, CHF exacerbation. The patient was admitted to a step down bed on BIPAP, although he remained in the ED overnight due to lack of a bed on a step down unit. ABG on BIPAP this morning shows no real improvement in PCO2, and the patient remains somnolent.The patient's pH remains positive as the patient likely has a chronically elevated pCO2. Even so, the patient remains somnolent. PCCM consulted for this reason.   He will continue to receive steroids and nebulizer treatment. Will check flu, blood cultures. Will place the patient on rocephin and azithromycin. I greatly appreciate PCCM's assistance.  Pleural Effusion: CTA chest demonstrates a large Pleural effusion causing collapse of the right  lung. Thoracentesis by PCCM of IR.   COPD: Due to panlobular emphysema. Steroids and nebulizer treatment.   Atypical pneumonia vs bronchitis: IV Rocephin and azithromycin  Pulmonary Hypertension: Secondary to obesity and COPD.   Non-ischemic Cardiomyopathy: Noted. Monitor volume status.  Essential Hypertension: Normotensive  Acute on chronic combined systolic and diastolic failure: Diuresis. Echo from 07/08/2019 demonstrated diastolic failure and right sided heart failure. The patient is 1.4 L negative.   Anemia: Multifactorial. Due to Fe deficiency and AOCD.  Paroxysmal Atrial Fibrillation: Currently in sinus rhythm. Will continue anticoagulation. Monitor on telemetry.  CKD IV: Monitor creatinine, electrolytes, and volume status. Avoid nephrotoxic medications and hypotension. Creatinine 2.19.   Tobacco Abuse: Nicotine patch. Tobacco cessation counseling.  DM II: Continue home insulin regimen. Likely to require more insulin due to steroids. Carbohydrate modified diet and hypoglycemia protocol.   Morbid Obesity: Complicates all aspects of patient care.  I have seen and examined this patient myself. I have spent 80 minutes in his evaluation and care.  DVT Prophylaxis: Eliquis CODE STATUS:  Full Code Family Communication: None available Disposition: tbd Fredrica Capano, DO [facility] Direct contact: see [email or web address]  7PM-7AM contact night coverage as above 08/10/2019, 12:22 PM  LOS: 1 day

## 2019-08-10 NOTE — Progress Notes (Signed)
Pt had UNNA boots on assessment.  Pt comdon cath fell off and urine soak the entire bed including UNNA boots.  UNNA boots removed and WOC consult placed.

## 2019-08-10 NOTE — ED Notes (Signed)
Internal medicine in with patient as well.  Pulmonology to admit to critical care, pt will remain NPO at this time.  Redirection required multiple times as he asks for a soda, and something to drink, states he is hungry.  Repositioned in bed and HOB elevated.  Pt slightly confused to place and time.  POC reviewed as well.

## 2019-08-10 NOTE — ED Notes (Signed)
Pulmonology in with patient for morning rounds.   All po dc'd at this time.

## 2019-08-11 ENCOUNTER — Inpatient Hospital Stay (HOSPITAL_COMMUNITY): Payer: No Typology Code available for payment source

## 2019-08-11 LAB — CBC
HCT: 29.3 % — ABNORMAL LOW (ref 39.0–52.0)
Hemoglobin: 8.9 g/dL — ABNORMAL LOW (ref 13.0–17.0)
MCH: 26.4 pg (ref 26.0–34.0)
MCHC: 30.4 g/dL (ref 30.0–36.0)
MCV: 86.9 fL (ref 80.0–100.0)
Platelets: 227 10*3/uL (ref 150–400)
RBC: 3.37 MIL/uL — ABNORMAL LOW (ref 4.22–5.81)
RDW: 21.4 % — ABNORMAL HIGH (ref 11.5–15.5)
WBC: 3.1 10*3/uL — ABNORMAL LOW (ref 4.0–10.5)
nRBC: 1 % — ABNORMAL HIGH (ref 0.0–0.2)

## 2019-08-11 LAB — GLUCOSE, CAPILLARY
Glucose-Capillary: 356 mg/dL — ABNORMAL HIGH (ref 70–99)
Glucose-Capillary: 278 mg/dL — ABNORMAL HIGH (ref 70–99)
Glucose-Capillary: 283 mg/dL — ABNORMAL HIGH (ref 70–99)
Glucose-Capillary: 263 mg/dL — ABNORMAL HIGH (ref 70–99)

## 2019-08-11 LAB — BASIC METABOLIC PANEL
Anion gap: 15 (ref 5–15)
BUN: 80 mg/dL — ABNORMAL HIGH (ref 8–23)
CO2: 40 mmol/L — ABNORMAL HIGH (ref 22–32)
Calcium: 8.5 mg/dL — ABNORMAL LOW (ref 8.9–10.3)
Chloride: 83 mmol/L — ABNORMAL LOW (ref 98–111)
Creatinine, Ser: 2.16 mg/dL — ABNORMAL HIGH (ref 0.61–1.24)
GFR calc Af Amer: 34 mL/min — ABNORMAL LOW (ref >60)
GFR calc non Af Amer: 30 mL/min — ABNORMAL LOW (ref >60)
Glucose, Bld: 351 mg/dL — ABNORMAL HIGH (ref 70–99)
Potassium: 3.2 mmol/L — ABNORMAL LOW (ref 3.5–5.1)
Sodium: 138 mmol/L (ref 135–145)

## 2019-08-11 LAB — HEPARIN LEVEL (UNFRACTIONATED): Heparin Unfractionated: >2.2 IU/mL — ABNORMAL HIGH (ref 0.30–0.70)

## 2019-08-11 LAB — APTT: aPTT: 184 seconds (ref 24–36)

## 2019-08-11 MED ORDER — POTASSIUM CHLORIDE CRYS ER 20 MEQ PO TBCR: 40.0000 meq | EXTENDED_RELEASE_TABLET | Freq: Once | ORAL | Status: DC

## 2019-08-11 MED ORDER — ISOSORB DINITRATE-HYDRALAZINE 20-37.5 MG PO TABS
1.0000 | ORAL_TABLET | Freq: Three times a day (TID) | ORAL | Status: DC
  Administered 2019-08-11 – 2019-08-15 (×13): 1 via ORAL
  Filled 2019-08-11 (×13): qty 1

## 2019-08-11 MED ORDER — BISOPROLOL FUMARATE 5 MG PO TABS
5.0000 mg | ORAL_TABLET | Freq: Every day | ORAL | Status: DC
  Administered 2019-08-11 – 2019-08-14 (×4): 5 mg via ORAL
  Filled 2019-08-11 (×4): qty 1

## 2019-08-11 MED ORDER — HEPARIN (PORCINE) 25000 UT/250ML-% IV SOLN: 1200.0000 [IU]/h | INTRAVENOUS | Status: DC

## 2019-08-11 MED ORDER — APIXABAN 5 MG PO TABS
5.0000 mg | ORAL_TABLET | Freq: Two times a day (BID) | ORAL | Status: DC
  Administered 2019-08-11 (×2): 5 mg via ORAL
  Filled 2019-08-11 (×3): qty 1

## 2019-08-11 MED ORDER — INSULIN ASPART 100 UNIT/ML ~~LOC~~ SOLN
0.0000 [IU] | Freq: Every day | SUBCUTANEOUS | Status: DC
  Administered 2019-08-11: 22:00:00 3 [IU] via SUBCUTANEOUS
  Administered 2019-08-12: 23:00:00 4 [IU] via SUBCUTANEOUS

## 2019-08-11 MED ORDER — ASPIRIN EC 81 MG PO TBEC
81.0000 mg | DELAYED_RELEASE_TABLET | Freq: Every day | ORAL | Status: DC
  Administered 2019-08-11 – 2019-08-15 (×5): 81 mg via ORAL
  Filled 2019-08-11 (×5): qty 1

## 2019-08-11 MED ORDER — INSULIN ASPART 100 UNIT/ML ~~LOC~~ SOLN
0.0000 [IU] | Freq: Three times a day (TID) | SUBCUTANEOUS | Status: DC
  Administered 2019-08-11: 12:00:00 20 [IU] via SUBCUTANEOUS
  Administered 2019-08-11: 17:00:00 11 [IU] via SUBCUTANEOUS
  Administered 2019-08-12 – 2019-08-13 (×4): 7 [IU] via SUBCUTANEOUS
  Administered 2019-08-13: 17:00:00 3 [IU] via SUBCUTANEOUS
  Administered 2019-08-13: 12:00:00 7 [IU] via SUBCUTANEOUS
  Administered 2019-08-14: 4 [IU] via SUBCUTANEOUS
  Administered 2019-08-14: 7 [IU] via SUBCUTANEOUS
  Administered 2019-08-15: 3 [IU] via SUBCUTANEOUS

## 2019-08-11 MED ORDER — ROSUVASTATIN CALCIUM 20 MG PO TABS
20.0000 mg | ORAL_TABLET | Freq: Every day | ORAL | Status: DC
  Administered 2019-08-11 – 2019-08-14 (×4): 20 mg via ORAL
  Filled 2019-08-11 (×2): qty 4
  Filled 2019-08-11: qty 1
  Filled 2019-08-11: qty 4

## 2019-08-11 MED ORDER — OXYBUTYNIN CHLORIDE 5 MG PO TABS
5.0000 mg | ORAL_TABLET | Freq: Three times a day (TID) | ORAL | Status: DC
  Administered 2019-08-11 – 2019-08-15 (×17): 5 mg via ORAL
  Filled 2019-08-11 (×17): qty 1

## 2019-08-11 MED ORDER — PANTOPRAZOLE SODIUM 40 MG PO TBEC: 40.0000 mg | DELAYED_RELEASE_TABLET | Freq: Every day | ORAL | Status: DC

## 2019-08-11 MED ORDER — POTASSIUM CHLORIDE CRYS ER 20 MEQ PO TBCR
40.0000 meq | EXTENDED_RELEASE_TABLET | Freq: Once | ORAL | Status: AC
  Administered 2019-08-11: 09:00:00 40 meq via ORAL
  Filled 2019-08-11: qty 2

## 2019-08-11 MED ORDER — OXYCODONE-ACETAMINOPHEN 5-325 MG PO TABS
1.0000 | ORAL_TABLET | Freq: Four times a day (QID) | ORAL | Status: DC | PRN
  Administered 2019-08-11 (×2): 1 via ORAL
  Administered 2019-08-12 – 2019-08-14 (×7): 2 via ORAL
  Administered 2019-08-14: 1 via ORAL
  Administered 2019-08-14 – 2019-08-15 (×2): 2 via ORAL
  Filled 2019-08-11: qty 1
  Filled 2019-08-11 (×3): qty 2
  Filled 2019-08-11: qty 1
  Filled 2019-08-11 (×3): qty 2
  Filled 2019-08-11 (×2): qty 1
  Filled 2019-08-11 (×3): qty 2

## 2019-08-11 MED ORDER — METHYLPREDNISOLONE SODIUM SUCC 40 MG IJ SOLR
40.0000 mg | Freq: Two times a day (BID) | INTRAMUSCULAR | Status: DC
  Administered 2019-08-11: 17:00:00 40 mg via INTRAVENOUS
  Filled 2019-08-11 (×3): qty 1

## 2019-08-11 MED ORDER — NICOTINE 14 MG/24HR TD PT24
14.0000 mg | MEDICATED_PATCH | Freq: Every day | TRANSDERMAL | Status: DC
  Administered 2019-08-11 – 2019-08-14 (×4): 14 mg via TRANSDERMAL
  Filled 2019-08-11 (×5): qty 1

## 2019-08-11 MED ORDER — PANTOPRAZOLE SODIUM 40 MG PO TBEC
40.0000 mg | DELAYED_RELEASE_TABLET | Freq: Two times a day (BID) | ORAL | Status: DC
  Administered 2019-08-11 – 2019-08-15 (×9): 40 mg via ORAL
  Filled 2019-08-11 (×9): qty 1

## 2019-08-11 NOTE — Progress Notes (Signed)
ANTICOAGULATION CONSULT NOTE  Pharmacy Consult for heparin Indication: atrial fibrillation  Heparin dosing weight: 102 kg  Vital Signs: Temp: 98.5 F (36.9 C) (10/08 0410) Temp Source: Oral (10/08 0410) BP: 105/57 (10/08 0600) Pulse Rate: 64 (10/08 0600)  Labs: Recent Labs    08/09/19 1441 08/09/19 1600  08/09/19 1830 08/10/19 0423 08/10/19 0443 08/10/19 1028 08/10/19 1945 08/11/19 0500  HGB 10.6*  --    < > 10.4* 11.9* 9.4*  --   --  8.9*  HCT 34.4*  --    < > 34.2* 35.0* 32.7*  --   --  29.3*  PLT 213  --   --  203  --  220  --   --  227  APTT  --   --   --   --   --   --  32 109* 184*  CREATININE 2.25*  --   --   --   --  2.19*  --   --  2.16*  TROPONINIHS 60* 61*  --   --   --   --   --   --   --    < > = values in this interval not displayed.    Estimated Creatinine Clearance: 41.1 mL/min (A) (by C-G formula based on SCr of 2.16 mg/dL (H)).   Assessment: 73 yo male on Eliquis PTA for AFib. His last dose was yesterday morning. He was admitted for SOB and cough. He is Covid-19 negative. His d-dimer was slightly elevated, he had a CTA of the chest which ruled out a PE but did show a pleural effusion. He is being transitioned to heparin while undergoing workup. CBC wnl, SCr 2.1.  aptt now high 184 - drawn from opposite side of body as heparin infusion No bleeding per RN  Goal of Therapy:  APTT 66-102 Heparin level 0.3-0.7 units/ml Monitor platelets by anticoagulation protocol: Yes  Plan:  Hold heparin x 1 hr Resume 1200 units/hr heparin @ 0730 Next aptt [place], PharmD, BCPS, BCCCP Clinical Pharmacist [phone]  Please check AMION for all [place] numbers  08/11/2019 6:31 AM

## 2019-08-11 NOTE — Evaluation (Signed)
Physical Therapy Evaluation Patient Details Name: Nicholas Caldwell MRN: [id number] DOB: 1946-03-27 Today's Date: 08/11/2019   History of Present Illness  73 y.o. M with PMH of COPD and diastolic HF who presented to the ED with dyspnea and three days of productive cough and subjective fever.  He was initiated on Bipap, has been intermittently somnolent, though improved this morning.  CTA chest showed no PE, but was significant for large R pleural effusion with complete collapse of the RLL.  Patient with recent admission for CHF exacerbation.  Clinical Impression  Patient presents with decreased mobility due to cardiorespiratory limitations and at baseline not that mobile.  He was getting HHPT from past hospitalization a month ago.  Currently min A for transfers mod A for both legs into bed to supine.  Feel he will benefit from skilled PT in the acute setting to allow return home with wife assist and follow up HHPT to resume.     Follow Up Recommendations Home health PT;Supervision/Assistance - 24 hour    Equipment Recommendations  None recommended by PT    Recommendations for Other Services       Precautions / Restrictions Precautions Precautions: Fall      Mobility  Bed Mobility Overal bed mobility: Needs Assistance Bed Mobility: Sit to Supine       Sit to supine: Mod assist   General bed mobility comments: assist for LE's into bed  Transfers   Equipment used: Rolling walker (2 wheeled) Transfers: Sit to/from [facility] Sit to Stand: Min assist Stand pivot transfers: Min assist       General transfer comment: assist for safety, pt stays flexed and keeps walker way out,  Ambulation/Gait                Stairs            Wheelchair Mobility    Modified Rankin (Stroke Patients Only)       Balance Overall balance assessment: Needs assistance Sitting-balance support: Feet supported Sitting balance-Leahy Scale: Good     Standing balance  support: Bilateral upper extremity supported;Single extremity supported Standing balance-Leahy Scale: Poor Standing balance comment: UE support for standing while PT assist with hygiene about 20 sec with limited activity tolerance.                             Pertinent Vitals/Pain Pain Assessment: Faces Faces Pain Scale: Hurts little more Pain Location: tender on perineum Pain Descriptors / Indicators: Grimacing Pain Intervention(s): Monitored during session;Repositioned;Other (comment)(cleansed)    Home Living Family/patient expects to be discharged to:: Private residence Living Arrangements: Spouse/significant other Available Help at Discharge: Family;Available 24 hours/day Type of Home: House Home Access: Stairs to enter Entrance Stairs-Rails: None Entrance Stairs-Number of Steps: 1 Home Layout: One level Home Equipment: Walker - 2 wheels;Cane - single point;Bedside commode;Shower seat;Wheelchair - manual Additional Comments: on 3L of O2 at all times at home; sleeps in recliner    Prior Function Level of Independence: Needs assistance   Gait / Transfers Assistance Needed: uses RW for mobility with very limited ambulation at home, was getting HHPT  ADL's / Homemaking Assistance Needed: assist from wife for LB ADL and wife does IADL        Hand Dominance        Extremity/Trunk Assessment   Upper Extremity Assessment Upper Extremity Assessment: Generalized weakness    Lower Extremity Assessment Lower Extremity Assessment: Generalized weakness  Cervical / Trunk Assessment Cervical / Trunk Assessment: Kyphotic  Communication   Communication: No difficulties  Cognition Arousal/Alertness: Awake/alert Behavior During Therapy: WFL for tasks assessed/performed Overall Cognitive Status: History of cognitive impairments - at baseline Area of Impairment: Orientation;Attention;Memory;Following commands                 Orientation Level: Disoriented  to;Time Current Attention Level: Sustained Memory: Decreased short-term memory Following Commands: Follows one step commands with increased time;Follows one step commands consistently     Problem Solving: Slow processing General Comments: Patient with multiple recent hospitalizations, likely not far from his baseline      General Comments General comments (skin integrity, edema, etc.): 4L O2 via  SpO2 88-92%    Exercises     Assessment/Plan    PT Assessment Patient needs continued PT services  PT Problem List Decreased activity tolerance;Decreased strength;Decreased mobility;Cardiopulmonary status limiting activity;Decreased knowledge of precautions;Decreased balance       PT Treatment Interventions DME instruction;Therapeutic activities;Stair training;Gait training;Functional mobility training;Therapeutic exercise;Patient/family education    PT Goals (Current goals can be found in the Care Plan section)  Acute Rehab PT Goals Patient Stated Goal: to get stronger, go home PT Goal Formulation: With patient/family Time For Goal Achievement: 08/25/19 Potential to Achieve Goals: Fair    Frequency Min 3X/week   Barriers to discharge        Co-evaluation               AM-PAC PT "6 Clicks" Mobility  Outcome Measure Help needed turning from your back to your side while in a flat bed without using bedrails?: A Little Help needed moving from lying on your back to sitting on the side of a flat bed without using bedrails?: A Little Help needed moving to and from a bed to a chair (including a wheelchair)?: A Little Help needed standing up from a chair using your arms (e.g., wheelchair or bedside chair)?: A Little Help needed to walk in hospital room?: A Little Help needed climbing 3-5 steps with a railing? : Total 6 Click Score: 16    End of Session Equipment Utilized During Treatment: Oxygen Activity Tolerance: Patient limited by fatigue Patient left: in bed;with call  bell/phone within reach;with family/visitor present Nurse Communication: Mobility status;Other (comment)(some type of skin injury on perineum) PT Visit Diagnosis: Difficulty in walking, not elsewhere classified (R26.2);Muscle weakness (generalized) (M62.81);Other abnormalities of gait and mobility (R26.89)    Time: 5110-2111 PT Time Calculation (min) (ACUTE ONLY): 48 min   Charges:   PT Evaluation $PT Eval Moderate Complexity: 1 Mod PT Treatments $Therapeutic Activity: 23-37 mins        Nicholas Caldwell, Nicholas Caldwell Acute Rehabilitation Services [phone] 08/11/2019   Nicholas Caldwell 08/11/2019, 2:14 PM

## 2019-08-11 NOTE — Progress Notes (Signed)
Orthopedic Tech Progress Note Patient Details:  Nicholas Caldwell 03/21/1946 [id number]  Ortho Devices Type of Ortho Device: Louretta Parma boot Ortho Device/Splint Location: Balateral unna boots Ortho Device/Splint Interventions: Application   Post Interventions Patient Tolerated: Well Instructions Provided: Care of device   [name] 08/11/2019, 2:14 PM

## 2019-08-11 NOTE — Progress Notes (Signed)
Inpatient Diabetes Program Recommendations  AACE/ADA: New Consensus Statement on Inpatient Glycemic Control   Target Ranges:  Prepandial:   less than 140 mg/dL      Peak postprandial:   less than 180 mg/dL (1-2 hours)      Critically ill patients:  140 - 180 mg/dL  Results for DARDEN, FLEMISTER (MRN [id number]) as of 08/11/2019 12:18  Ref. Range 08/10/2019 09:36 08/10/2019 17:20 08/11/2019 11:47  Glucose-Capillary Latest Ref Range: 70 - 99 mg/dL 169 (H) 208 (H) 356 (H)    Review of Glycemic Control  Diabetes history: DM2 Outpatient Diabetes medications:70/30 10 units BID, Novolog 4 units TID with meals Current orders for Inpatient glycemic control: Novolog 0-20 units TID with meals, Novolog 0-5 units QHS; Solumedrol 40 mg BID  Inpatient Diabetes Program Recommendations:    Insulin-Basal: Please consider ordering Lantus 20 units Q24H starting now.  Insulin-Meal Coverage: If steroids are continued, please consider ordering Novolog 4 units TID with meals for meal coverage if patient eats at least 50% of meals.  Thanks, Barnie Alderman, RN, MSN, CDE Diabetes Coordinator Inpatient Diabetes Program [phone] (Team Pager from 8am to 5pm)

## 2019-08-11 NOTE — Progress Notes (Signed)
NAME:  Nicholas Caldwell, MRN:  [id number], DOB:  1946/03/21, LOS: 2 ADMISSION DATE:  08/09/2019, CONSULTATION DATE:  08/11/19 REFERRING MD:  Dr. Radene Gunning, CHIEF COMPLAINT:  Dyspnea   Brief History   73 yo male with cough, dyspnea, fever.  Found to have pneumonia with acute on chronic hypoxia/hypercapnia.  Past Medical History  A flutter, PAF, CAD, Chronic Pain, CHF, COPD, DM, HLD, HTN, Pulm HTN  Significant Hospital Events   10/6 Admitted to Internal Medicine 10/7 transfer to ICU 10/8 transfer to progressive care  Consults:   Procedures:  Lt PICC  Significant Diagnostic Tests:  10/6 CTA chest>>large right pleural effusion with complete collapse of the right lower lobe. Progressive partial collapse of the left lower lobe.  Micro Data:  10/6 Blood Culture>> 10/6 Sars-CoV-2>> neg  Antimicrobials:  Ceftriaxone 10/6> Vanc 10/6  Zosyn 10/6   Interim history/subjective:  Slept okay.  Used Bipap overnight.  Feels like getting more air in lungs.  Objective   Blood pressure (!) 105/57, pulse 64, temperature 98.5 F (36.9 C), temperature source Oral, resp. rate 20, weight 115 kg, SpO2 99 %.    FiO2 (%):  [40 %] 40 %   Intake/Output Summary (Last 24 hours) at 08/11/2019 0758 Last data filed at 08/11/2019 0600 Gross per 24 hour  Intake 678.97 ml  Output 2700 ml  Net -2021.03 ml   Filed Weights   08/10/19 2000  Weight: 115 kg    General - alert Eyes - pupils reactive ENT - no sinus tenderness, no stridor Cardiac - regular rate/rhythm, no murmur Chest - basilar rales Abdomen - soft, non tender, + bowel sounds Extremities - 1+ edema Skin - no rashes Neuro - normal strength, moves extremities, follows commands  CXR (reviewed by me) - b/l ASD with improved aeration on [facility] Problem list     Assessment & Plan:   Acute on chronic hypoxic/hypercapnic respiratory. - from COPD exacerbation, pneumonia, pleural effusion - goal SpO2 90 to 95% - Bipap qhs and prn  - complete course of mucomyst nebulizer 10/8 - continue duoneb, flutter valve - continue ABx - f/u CXR intermittently - wean off steroids as tolerated  Recurrent PNA. - concern for aspiration - consult speech therapy  Hx of HTN, non-ischemic CM, A fib/flutter. - continue lasix - resume eliquis, zebeta, ASA, bidil, crestor - hold zaroxolyn  Hypokalemia. - replace as needed  CKD 3. - f/u BMET  Anemia of critical illness and chronic disease. - f/u CBC - transfuse for Hb < 7 or significant bleeding  DM type II with steroid induced hyperglycemia. - SSI  Peripheral neuropathy. - hold neurontin, percocet for now  Best practice:  Diet: carb modified DVT prophylaxis: eliquis GI prophylaxis: protonix  Glucose control: SSI Mobility: OOB Code Status: Full Disposition: progressive care >> to [facility] 10/9 and PCCM follow as consult  Labs    CMP Latest Ref Rng & Units 08/11/2019 08/10/2019 08/10/2019  Glucose 70 - 99 mg/dL 351(H) 137(H) -  BUN 8 - 23 mg/dL 80(H) 77(H) -  Creatinine 0.61 - 1.24 mg/dL 2.16(H) 2.19(H) -  Sodium 135 - 145 mmol/L 138 139 134(L)  Potassium 3.5 - 5.1 mmol/L 3.2(L) 3.2(L) 3.1(L)  Chloride 98 - 111 mmol/L 83(L) 80(L) -  CO2 22 - 32 mmol/L 40(H) 37(H) -  Calcium 8.9 - 10.3 mg/dL 8.5(L) 8.8(L) -  Total Protein 6.5 - 8.1 g/dL - 6.2(L) -  Total Bilirubin 0.3 - 1.2 mg/dL - 1.5(H) -  Alkaline Phos 38 -  126 U/L - 112 -  AST 15 - 41 U/L - 16 -  ALT 0 - 44 U/L - 16 -   CBC Latest Ref Rng & Units 08/11/2019 08/10/2019 08/10/2019  WBC 4.0 - 10.5 K/uL 3.1(L) 2.6(L) -  Hemoglobin 13.0 - 17.0 g/dL 8.9(L) 9.4(L) 11.9(L)  Hematocrit 39.0 - 52.0 % 29.3(L) 32.7(L) 35.0(L)  Platelets 150 - 400 K/uL 227 220 -   ABG    Component Value Date/Time   PHART 7.370 08/10/2019 0423   PCO2ART 75.8 (HH) 08/10/2019 0423   PO2ART 67.0 (L) 08/10/2019 0423   HCO3 43.9 (H) 08/10/2019 0423   TCO2 46 (H) 08/10/2019 0423   ACIDBASEDEF 0.5 04/09/2019 2005   O2SAT 91.0 08/10/2019  0423   CBG (last 3)  Recent Labs    08/09/19 2111 08/10/19 0936 08/10/19 [place], MD [facility] 08/11/2019, 8:14 AM

## 2019-08-11 NOTE — Evaluation (Signed)
Clinical/Bedside Swallow Evaluation Patient Details  Name: Nicholas Caldwell MRN: [id number] Date of Birth: 1946/10/01  Today's Date: 08/11/2019 Time: SLP Start Time (ACUTE ONLY): 1049 SLP Stop Time (ACUTE ONLY): 1057 SLP Time Calculation (min) (ACUTE ONLY): 8 min  Past Medical History:  Past Medical History:  Diagnosis Date  . Atrial flutter ([place])    a. recurrent AFlutter with RVR;  b. Amiodarone Rx started 4/16  . CAD (coronary artery disease)    a. LHC 1/16:  mLAD diffuse disease, pLCx mild disease, dLCx with disease but too small for PCI, RCA ok, EF 25-30%  . Chronic pain   . Chronic systolic CHF (congestive heart failure) ([place])   . COPD (chronic obstructive pulmonary disease) ([place])   . Diabetes mellitus without complication ([place])   . Hypercholesteremia   . Hypertension   . NICM (nonischemic cardiomyopathy) ([place])    a.dx 2016. b. 2D echo 06/2016 - Last echo 07/01/16: mod dilated LV, mod LVH, EF 25-30%, mild-mod MR, sev LAE, mild-mod reduced RV systolic function, mild-mod TR, PASP 92mHG.  .Marland KitchenPAF (paroxysmal atrial fibrillation) ([facility])    On amio - ot a candidate for flecainide due to cardiomyopathy, not a candidate for Tikosyn due to prolonged QT, and felt to be a poor candidate for ablation given left atrial size.  . Pulmonary hypertension ([facility]   . Tobacco abuse    Past Surgical History:  Past Surgical History:  Procedure Laterality Date  . BIOPSY  03/11/2019   Procedure: BIOPSY;  Surgeon: CLavena Bullion DO;  [facility]  Service: Gastroenterology;;  . BIOPSY  03/15/2019   Procedure: BIOPSY;  Surgeon: MIrving Copas, MD;  [facility]  Service: Gastroenterology;;  . CARDIOVERSION N/A 07/30/2018   Procedure: CARDIOVERSION;  Surgeon: MLarey Dresser MD;  [facility];  Service: Cardiovascular;  Laterality: N/A;  . COLONOSCOPY N/A 03/11/2019   Procedure: COLONOSCOPY;  Surgeon: CLavena Bullion DO;  [facility]  Service:  Gastroenterology;  Laterality: N/A;  . ENTEROSCOPY N/A 03/15/2019   Procedure: ENTEROSCOPY;  Surgeon: MRush LandmarkGTelford Nab, MD;  Location[facility]  Service: Gastroenterology;  Laterality: N/A;  . ESOPHAGOGASTRODUODENOSCOPY Left 03/11/2019   Procedure: ESOPHAGOGASTRODUODENOSCOPY (EGD);  Surgeon: CLavena Bullion DO;  [facility];  Service: Gastroenterology;  Laterality: Left;  . LEFT AND RIGHT HEART CATHETERIZATION WITH CORONARY ANGIOGRAM N/A 11/06/2014   Procedure: LEFT AND RIGHT HEART CATHETERIZATION WITH CORONARY ANGIOGRAM;  Surgeon: JJettie Booze MD;  [facility];  Service: Cardiovascular;  Laterality: N/A;  . RIGHT/LEFT HEART CATH AND CORONARY ANGIOGRAPHY N/A 12/06/2018   Procedure: RIGHT/LEFT HEART CATH AND CORONARY ANGIOGRAPHY;  Surgeon: MLarey Dresser MD;  [facility];  Service: Cardiovascular;  Laterality: N/A;  . SPLENECTOMY    . SUBMUCOSAL TATTOO INJECTION  03/15/2019   Procedure: SUBMUCOSAL TATTOO INJECTION;  Surgeon: MRush LandmarkGTelford Nab, MD;  [facility];  Service: Gastroenterology;;   HPI:  73y.o. M with PMH of COPD and diastolic HF who presented to the ED with dyspnea and three days of productive cough and subjective fever.  Chest imaging revealed  a large right pleural effusion with complete collapseof the right lower lobe and progressive partial collapse of the left lower lobe.  He has been afebrile since admission. Chest imaging is concerning for possible superimposed pneumonia.   Assessment / Plan / Recommendation Clinical Impression  Pt presents with functional swallowing as assessed clinically.  Pt is edentulous but elected not to place dentures  for PO trials today.  There were no clinical s/s of aspiration with puree or thin liquid.  With regular solid there was trace-mild diffuse oral residue intermittently. Pt used liquid was independently.  Pt exhibited reflexive cough x1 on initial trial of graham cracker  only.  Pt stated that the cracker felt stuck in his throat and tickled and it did not go down the wrong way.  There were no further clinical s/s of aspiraiton with additional trials of regular texture solid.  RN reports no difficulty with current diet.  Recommend continuing regular texture diet with thin liquid.  SLP will sign off at this time. SLP Visit Diagnosis: Dysphagia, unspecified (R13.10)    Aspiration Risk  No limitations    Diet Recommendation Regular;Thin liquid   Liquid Administration via: Cup;Straw Medication Administration: Whole meds with liquid Supervision: Patient able to self feed Compensations: Slow rate;Small sips/bites Postural Changes: Seated upright at 90 degrees    Other  Recommendations Oral Care Recommendations: Oral care BID   Follow up Recommendations None      Frequency and Duration   N/A         Prognosis   N/A     Swallow Study   General HPI: 73 y.o. M with PMH of COPD and diastolic HF who presented to the ED with dyspnea and three days of productive cough and subjective fever.  Chest imaging revealed  a large right pleural effusion with complete collapseof the right lower lobe and progressive partial collapse of the left lower lobe.  He has been afebrile since admission. Chest imaging is concerning for possible superimposed pneumonia. Type of Study: Bedside Swallow Evaluation Previous Swallow Assessment: none Diet Prior to this Study: Regular Temperature Spikes Noted: No Respiratory Status: Nasal cannula History of Recent Intubation: No Behavior/Cognition: Alert;Cooperative;Pleasant mood Oral Cavity Assessment: Within Functional Limits Oral Care Completed by SLP: No Oral Cavity - Dentition: Edentulous Vision: Functional for self-feeding Self-Feeding Abilities: Able to feed self Patient Positioning: Upright in chair Baseline Vocal Quality: Normal Volitional Cough: Strong Volitional Swallow: Able to elicit    Oral/Motor/Sensory Function  Overall Oral Motor/Sensory Function: Within functional limits Facial ROM: Within Functional Limits Facial Symmetry: Within Functional Limits Lingual ROM: Within Functional Limits Lingual Symmetry: Within Functional Limits Lingual Strength: Within Functional Limits Velum: Within Functional Limits Mandible: Within Functional Limits   Ice Chips Ice chips: Not tested   Thin Liquid Thin Liquid: Within functional limits Presentation: Cup;Straw    Nectar Thick Nectar Thick Liquid: Not tested   Honey Thick Honey Thick Liquid: Not tested   Puree Puree: Within functional limits   Solid     Solid: Impaired Presentation: Self Fed Pharyngeal Phase Impairments: Cough - Delayed      Celedonio Savage, MA, [place] Office: [phone]; Pager (10/8): [phone] 08/11/2019,11:17 AM

## 2019-08-11 NOTE — Progress Notes (Signed)
Patient condom cath came off for the third time in the past 24 hours. Patient is alert and oriented and able to tell when he needs to urinate. Provided patient with a urinal since he is up in chair. Patient is refusing to use urinal, because he states he can't use his arms due to his PICC line and IV in right hand, that are both saline locked. I explained to the patient that he already has a skin tear to his scrotum so re-applying a condom cath or rectal pouch around genital area would not be in his best interest and possibly would make the skin tear worse. Patient states he doesn't care. Will continue to monitor and encourage patient to use urinal.

## 2019-08-11 NOTE — Progress Notes (Signed)
Per pharmacy, hold heparin x1 hour and then will restart per pharmacy.

## 2019-08-11 NOTE — Consult Note (Addendum)
[place] Nurse wound consult note Patient receiving care in [facility] 3M10.  Orange box on left side of screen lists "COVID-19:  Suspected".  Consult is being completed remotely.  No images available. Reason for Consult: LE venous stasis Primary RN, Jake Bathe, has agreed to page me when she is available so I can camera in to see the LE wounds. [id number]:  I spoke with primary RN Nira Conn by phone.  She explained that the patient had unna boots on when he was admitted; they were cut off.  No wounds are present.  I have placed an order for unna boots to be placed by the Ortho Tech. Change weekly. Val Riles, RN, MSN, CWOCN, CNS-BC, pager [phone]

## 2019-08-12 DIAGNOSIS — J90 Pleural effusion, not elsewhere classified: Secondary | ICD-10-CM

## 2019-08-12 DIAGNOSIS — J9601 Acute respiratory failure with hypoxia: Secondary | ICD-10-CM

## 2019-08-12 DIAGNOSIS — J9602 Acute respiratory failure with hypercapnia: Secondary | ICD-10-CM

## 2019-08-12 LAB — CBC
HCT: 27.7 % — ABNORMAL LOW (ref 39.0–52.0)
Hemoglobin: 8.7 g/dL — ABNORMAL LOW (ref 13.0–17.0)
MCH: 26.8 pg (ref 26.0–34.0)
MCHC: 31.4 g/dL (ref 30.0–36.0)
MCV: 85.2 fL (ref 80.0–100.0)
Platelets: 206 10*3/uL (ref 150–400)
RBC: 3.25 MIL/uL — ABNORMAL LOW (ref 4.22–5.81)
RDW: 21.1 % — ABNORMAL HIGH (ref 11.5–15.5)
WBC: 5 10*3/uL (ref 4.0–10.5)
nRBC: 0.6 % — ABNORMAL HIGH (ref 0.0–0.2)

## 2019-08-12 LAB — BASIC METABOLIC PANEL
Anion gap: 15 (ref 5–15)
BUN: 77 mg/dL — ABNORMAL HIGH (ref 8–23)
CO2: 42 mmol/L — ABNORMAL HIGH (ref 22–32)
Calcium: 8.7 mg/dL — ABNORMAL LOW (ref 8.9–10.3)
Chloride: 80 mmol/L — ABNORMAL LOW (ref 98–111)
Creatinine, Ser: 1.92 mg/dL — ABNORMAL HIGH (ref 0.61–1.24)
GFR calc Af Amer: 40 mL/min — ABNORMAL LOW (ref >60)
GFR calc non Af Amer: 34 mL/min — ABNORMAL LOW (ref >60)
Glucose, Bld: 285 mg/dL — ABNORMAL HIGH (ref 70–99)
Potassium: 3.2 mmol/L — ABNORMAL LOW (ref 3.5–5.1)
Sodium: 137 mmol/L (ref 135–145)

## 2019-08-12 LAB — GLUCOSE, CAPILLARY
Glucose-Capillary: 219 mg/dL — ABNORMAL HIGH (ref 70–99)
Glucose-Capillary: 216 mg/dL — ABNORMAL HIGH (ref 70–99)
Glucose-Capillary: 236 mg/dL — ABNORMAL HIGH (ref 70–99)
Glucose-Capillary: 314 mg/dL — ABNORMAL HIGH (ref 70–99)

## 2019-08-12 LAB — BRAIN NATRIURETIC PEPTIDE: B Natriuretic Peptide: 2708.1 pg/mL — ABNORMAL HIGH (ref 0.0–100.0)

## 2019-08-12 LAB — MAGNESIUM: Magnesium: 2.6 mg/dL — ABNORMAL HIGH (ref 1.7–2.4)

## 2019-08-12 MED ORDER — METHYLPREDNISOLONE SODIUM SUCC 40 MG IJ SOLR
40.0000 mg | Freq: Every day | INTRAMUSCULAR | Status: DC
  Filled 2019-08-12: qty 1

## 2019-08-12 MED ORDER — SODIUM CHLORIDE 0.9 % IV SOLN
2.0000 g | INTRAVENOUS | Status: AC
  Administered 2019-08-13 – 2019-08-14 (×2): 2 g via INTRAVENOUS
  Filled 2019-08-12: qty 20
  Filled 2019-08-12: qty 2

## 2019-08-12 MED ORDER — POTASSIUM CHLORIDE CRYS ER 20 MEQ PO TBCR
20.0000 meq | EXTENDED_RELEASE_TABLET | Freq: Once | ORAL | Status: AC
  Administered 2019-08-12: 20 meq via ORAL
  Filled 2019-08-12: qty 1

## 2019-08-12 MED ORDER — POTASSIUM CHLORIDE CRYS ER 20 MEQ PO TBCR
20.0000 meq | EXTENDED_RELEASE_TABLET | Freq: Two times a day (BID) | ORAL | Status: DC
  Administered 2019-08-12: 23:00:00 20 meq via ORAL
  Filled 2019-08-12: qty 1

## 2019-08-12 MED ORDER — INSULIN GLARGINE 100 UNIT/ML ~~LOC~~ SOLN
15.0000 [IU] | Freq: Every day | SUBCUTANEOUS | Status: DC
  Administered 2019-08-12: 15 [IU] via SUBCUTANEOUS
  Filled 2019-08-12 (×2): qty 0.15

## 2019-08-12 NOTE — Progress Notes (Addendum)
NAME:  Nicholas Caldwell, MRN:  [id number], DOB:  08/05/1946, LOS: 3 ADMISSION DATE:  08/09/2019, CONSULTATION DATE:  08/12/19 REFERRING MD:  Dr. Radene Gunning, CHIEF COMPLAINT:  Dyspnea   Brief History   73 yo male with cough, dyspnea, fever.  Found to have pneumonia with acute on chronic hypoxia/hypercapnia.  Past Medical History  A flutter, PAF, CAD, Chronic Pain, CHF, COPD, DM, HLD, HTN, Pulm HTN  Significant Hospital Events   10/6 Admitted to Internal Medicine 10/7 transfer to ICU 10/8 transfer to progressive care  Consults:   Procedures:  Lt PICC  Significant Diagnostic Tests:  10/6 CTA chest>>large right pleural effusion with complete collapse of the right lower lobe. Progressive partial collapse of the left lower lobe.  Micro Data:  10/6 Blood Culture>> 10/6 Sars-CoV-2>> neg  Antimicrobials:  Ceftriaxone 10/6> Vanc 10/6  Zosyn 10/6   Interim history/subjective:  Slept okay.  Used Bipap overnight.  Feels like getting more air in lungs. Refusing steroids (Make me feel high and then I can't sleep) and thoracentesis scheduled for tomorrow ( Eliquis held 10/9) Net negative 3.7 L>> CO2 is 42 ( ? Contraction alkalosis vs chronic 2/2 COPD/ OHS/OSA)>> Consider holding lasix 10/9  Objective   Blood pressure 115/60, pulse 84, temperature 98.6 F (37 C), temperature source Oral, resp. rate (!) 21, weight 115 kg, SpO2 92 %.    FiO2 (%):  [40 %] 40 %   Intake/Output Summary (Last 24 hours) at 08/12/2019 0945 Last data filed at 08/12/2019 0800 Gross per 24 hour  Intake 520 ml  Output 1400 ml  Net -880 ml   Filed Weights   08/10/19 2000  Weight: 115 kg    General - alert, awake and appropriate, OOB in chair Eyes - pupils reactive, brisk and 3 mm ENT - no sinus tenderness, no stridor Cardiac - S1, S2, regular rate/rhythm, no murmur Chest - Bilateral chest excursion, basilar rales R>L Abdomen - soft, non tender, + bowel sounds, Body mass index is 34.38 kg/m. Extremities - 1+  edema, otherwise no obvious deformities Skin - no rashes or lesions Neuro - normal strength, moves extremities, follows commands, alert and oriented. CXR (reviewed by me) - b/l ASD with improved aeration on [facility] Problem list     Assessment & Plan:   Acute on chronic hypoxic/hypercapnic respiratory. - from COPD exacerbation, pneumonia, R pleural effusion - goal SpO2 90 to 95% - Bipap qhs and prn - complete course of mucomyst nebulizer 10/8 - continue duoneb, flutter valve, IS - continue ABX ( Day 4 Rocephin) - f/u CXR intermittently - wean off steroids as tolerated ( Consider dropping to 30 mg BID) - For thoracentesis 10/10 per nursing ( Eliquis held 10/9)  Recurrent PNA. - concern for aspiration - consult speech therapy>> No aspiration risk per Speech ( See assessment 10/8)  Hx of HTN, non-ischemic CM, A fib/flutter. - continue lasix - resume eliquis, zebeta, ASA, bidil, crestor - hold zaroxolyn  Hypokalemia. - replace as needed  Contraction alkalosis vs chronic CO2 elevation 2/2 COPD/ OHS / OSA Net negative 3.7 L P Consider holding lasix today>> per primary team Trend BMET Follow I&O  CKD 3. - f/u BMET - Monitor UO  Anemia of critical illness and chronic disease. - f/u CBC - transfuse for Hb < 7 or significant bleeding  DM type II with steroid induced hyperglycemia. Continued elevated CBG's P - Appreciate diabetes coordinator note - Will add 15 Lantus daily( Better today, so felt the 20 U  recommended may be too much) - SSI - Will need to make insulin adjustments with tapering of steroids  Peripheral neuropathy. - hold neurontin, percocet for now  Best practice:  Diet: carb modified DVT prophylaxis: eliquis GI prophylaxis: protonix  Glucose control: SSI Mobility: OOB Code Status: Full Disposition: progressive care >> to [facility] 10/9 and PCCM follow as consult  Labs    CMP Latest Ref Rng & Units 08/12/2019 08/11/2019 08/10/2019   Glucose 70 - 99 mg/dL 285(H) 351(H) 137(H)  BUN 8 - 23 mg/dL 77(H) 80(H) 77(H)  Creatinine 0.61 - 1.24 mg/dL 1.92(H) 2.16(H) 2.19(H)  Sodium 135 - 145 mmol/L 137 138 139  Potassium 3.5 - 5.1 mmol/L 3.2(L) 3.2(L) 3.2(L)  Chloride 98 - 111 mmol/L 80(L) 83(L) 80(L)  CO2 22 - 32 mmol/L 42(H) 40(H) 37(H)  Calcium 8.9 - 10.3 mg/dL 8.7(L) 8.5(L) 8.8(L)  Total Protein 6.5 - 8.1 g/dL - - 6.2(L)  Total Bilirubin 0.3 - 1.2 mg/dL - - 1.5(H)  Alkaline Phos 38 - 126 U/L - - 112  AST 15 - 41 U/L - - 16  ALT 0 - 44 U/L - - 16   CBC Latest Ref Rng & Units 08/12/2019 08/11/2019 08/10/2019  WBC 4.0 - 10.5 K/uL 5.0 3.1(L) 2.6(L)  Hemoglobin 13.0 - 17.0 g/dL 8.7(L) 8.9(L) 9.4(L)  Hematocrit 39.0 - 52.0 % 27.7(L) 29.3(L) 32.7(L)  Platelets 150 - 400 K/uL 206 227 220   ABG    Component Value Date/Time   PHART 7.370 08/10/2019 0423   PCO2ART 75.8 (HH) 08/10/2019 0423   PO2ART 67.0 (L) 08/10/2019 0423   HCO3 43.9 (H) 08/10/2019 0423   TCO2 46 (H) 08/10/2019 0423   ACIDBASEDEF 0.5 04/09/2019 2005   O2SAT 91.0 08/10/2019 0423   CBG (last 3)  Recent Labs    08/11/19 1942 08/11/19 2129 08/12/19 0747  GLUCAP 283* 263* 219*   Critical Care APP time: 35 minutes Magdalen Spatz, MSN, AGACNP-BC [place] Pager # [phone] After 4 pm please call [phone] 08/12/2019, 9:45 AM

## 2019-08-12 NOTE — Discharge Instructions (Signed)

## 2019-08-12 NOTE — Progress Notes (Signed)
Physical Therapy Treatment Patient Details Name: Nicholas Caldwell MRN: [id number] DOB: 1946-04-12 Today's Date: 08/12/2019    History of Present Illness 73 y.o. M with PMH of COPD and diastolic HF who presented to the ED with dyspnea and three days of productive cough and subjective fever.  He was initiated on Bipap, has been intermittently somnolent, though improved this morning.  CTA chest showed no PE, but was significant for large R pleural effusion with complete collapse of the RLL.  Patient with recent admission for CHF exacerbation.    PT Comments    Pt making slow, steady progress.    Follow Up Recommendations  Home health PT;Supervision/Assistance - 24 hour     Equipment Recommendations  None recommended by PT    Recommendations for Other Services       Precautions / Restrictions Precautions Precautions: Fall Precaution Comments: watch sats Restrictions Weight Bearing Restrictions: No    Mobility  Bed Mobility               General bed mobility comments: Pt up in chair  Transfers Overall transfer level: Needs assistance Equipment used: Rolling walker (2 wheeled) Transfers: Sit to/from Stand Sit to Stand: Min assist         General transfer comment: Assist for balance and stability coming to stand.  Ambulation/Gait Ambulation/Gait assistance: Min guard Gait Distance (Feet): 15 Feet Assistive device: Rolling walker (2 wheeled) Gait Pattern/deviations: Decreased step length - right;Decreased step length - left;Wide base of support;Trunk flexed;Step-through pattern;Shuffle Gait velocity: decr Gait velocity interpretation: <1.31 ft/sec, indicative of household ambulator General Gait Details: Assist for balance. Verbal cues to stay closer to walker and to stand more erect. Pt keeps trunk flexed and at times props forearms on walker.   Stairs             Wheelchair Mobility    Modified Rankin (Stroke Patients Only)       Balance Overall  balance assessment: Needs assistance Sitting-balance support: Feet supported Sitting balance-Leahy Scale: Good     Standing balance support: Bilateral upper extremity supported;Single extremity supported Standing balance-Leahy Scale: Poor Standing balance comment: walker and min guard for static standing                            Cognition Arousal/Alertness: Awake/alert Behavior During Therapy: WFL for tasks assessed/performed Overall Cognitive Status: History of cognitive impairments - at baseline                                        Exercises      General Comments General comments (skin integrity, edema, etc.): Pt on 4L of O2 at rest. Incr to 6L for amb as he does at home. SpO2 after amb 86%.      Pertinent Vitals/Pain Pain Assessment: No/denies pain    Home Living                      Prior Function            PT Goals (current goals can now be found in the care plan section) Acute Rehab PT Goals Patient Stated Goal: to get stronger, go home Progress towards PT goals: Progressing toward goals    Frequency    Min 3X/week      PT Plan Current plan remains appropriate    Co-evaluation  AM-PAC PT "6 Clicks" Mobility   Outcome Measure  Help needed turning from your back to your side while in a flat bed without using bedrails?: A Little Help needed moving from lying on your back to sitting on the side of a flat bed without using bedrails?: A Little Help needed moving to and from a bed to a chair (including a wheelchair)?: A Little Help needed standing up from a chair using your arms (e.g., wheelchair or bedside chair)?: A Little Help needed to walk in hospital room?: A Little Help needed climbing 3-5 steps with a railing? : Total 6 Click Score: 16    End of Session Equipment Utilized During Treatment: Oxygen;Gait belt Activity Tolerance: Patient limited by fatigue Patient left: with call bell/phone  within reach;in chair Nurse Communication: Mobility status PT Visit Diagnosis: Difficulty in walking, not elsewhere classified (R26.2);Muscle weakness (generalized) (M62.81);Other abnormalities of gait and mobility (R26.89)     Time: 4628-6381 PT Time Calculation (min) (ACUTE ONLY): 19 min  Charges:  $Gait Training: 8-22 mins                     [place] Pager [phone] Office [place] 08/12/2019, 10:47 AM

## 2019-08-12 NOTE — Progress Notes (Signed)
PROGRESS NOTE    Nicholas Caldwell  EPP:[id number] DOB: 04/30/1946 DOA: 08/09/2019 PCP: [facility]    Brief Narrative:  [name] is a 73 y.o. M with HTN, pAF on Eliquis, ACD, sCHF EF 40-45%, DM with neuropathy, WC bound, COPD on 3L O2, OSA who presneted with 3 days productive cough and chills.  In the ER, he had hypoxia requiring BiPAP as well as hypercarbiia and BNP >2000.  CTA chest showed no PE but large RIGHT pleural effusion.    Assessment & Plan:  Acute on Chronic Hypoxemic / Hypercarbic Respiratory Failure -in setting of RLL pneumonia with large compressive effusion  -4L O2 dependent at baseline  -Wean O2 for sats > 88% back to baseline 4L -Pulmonary hygiene - flutter valve encouraged    Recurrent RLL PNA with Pleural Effusion  Concern for parapneumonic effusion.  SLP evaluated patient, no overt aspiration. Afebrile, respiratory rate 20-28, on 4 L oxygen.  -Continue Rocephin, day 4 of 7 -Appreciate SLP evaluation  -Hold anticoagulation in anticipation for thoracentesis -Send pleural fluid for LDH, protein, GS, culture & sensitivity, cytology -Send serum LDH, protein    COPD with Acute Exacerbation  -Continue Solu-Medrol -Continue scheduled and PRN bronchodilators -Hold home Symbicort  Acute on chronic systolic CHF Stable, potassium low.  -1 L yesterday, 3.4 L on admission. -Continue Lasix - Continue potassium supplement -Continue BiDil, beta-blocker -Hold home torsemide, Zaroxolyn  Paroxysmal atrial fibrillation Heart rate controlled -Continue beta-blocker -Hold Eliquis for thoracentesis  Suspected OSA -continue BiPAP QHS and PRN daytime sleep  -consider outpatient evaluation / PSG  Hx HTN, AF/Flutter, HLD -LE's wrapped -continue zebeta, ASA, bidil, crestor -hold Eliquis in anticipation for thoracentesis  -monitor I/O's    Hypokalemia -monitor, replace as indicated  -20 mEq KCL x1   CKD III  creatinine stable relative to baseline,  1.6-2 -trend BMP / UOP  -avoid nephrotoxic agents as able, ensure adequate renal perfusion    Anemia  -ACD with superimposed critical illness -follow CBC -transfuse per guidelines    Diabetes complicated by hyperglycemia -SSI  -lantus 15 units QD -reduce steroids as above    Peripheral Neuropathy  -PRN percocet    BPH -continue flomax   Stage I right buttock pressure injury, present on admission    MDM and disposition: The below labs and imaging reports were reviewed and summarized above.  Medication management as above.  This is a severe illness with threat to life and bodily function, and severe exacerbation of his chronic disease.  The patient was admitted with acute on chronic respiratory failure from CHF, and pneumonia.  He remains fluid overloaded, will need continued IV diuresis.  He is also still tachypneic, to 28 breaths/min, and requiring increased oxygen.       DVT prophylaxis: Eliquis, on hold since 10/9 for thoracentesis  Code Status: Full Code  Family Communication: Patient updated on plan of care     Consultants:   IR   Procedures:   SLP evaluation 10/8 >> regular diet, thin liquids  Thoracentesis 10/10 >>   Antimicrobials:   Ceftriaxone 10/6 >>    Subjective: Pt initially indicated he does not want to proceed with thoracentesis.  Once explained, he is willing to proceed. Denies acute complaints.  Afebrile. I/O- 1.6L UOP in last 24 hours, net neg 1L in last 24 hours. Afebrile.   Objective: Vitals:   08/12/19 0600 08/12/19 0700 08/12/19 0758 08/12/19 0800  BP: (!) 117/53 115/60    Pulse: 65 (!) 59  77  Resp: (!) 21 17  (!) 22  Temp:    98.6 F (37 C)  TempSrc:    Oral  SpO2: 96% 93% 93% 90%  Weight:        Intake/Output Summary (Last 24 hours) at 08/12/2019 0845 Last data filed at 08/12/2019 0800 Gross per 24 hour  Intake 628.55 ml  Output 1950 ml  Net -1321.45 ml   Filed Weights   08/10/19 2000  Weight: 115 kg     Examination: General appearance: chronically ill appearing obese male sitting up in chair in NAD HEENT: Anicteric, conjunctiva pink, lids and lashes normal. No nasal deformity, discharge, epistaxis.  Lips moist. [place] O2   Skin: Warm and dry.  no jaundice.  No suspicious rashes or lesions. Cardiac: RRR, nl S1-S2, SR on monitor, no murmurs appreciated.  Capillary refill is brisk ,3 sec. LE's wrapped in compressive dressings.  Radial pulses 2+ and symmetric. Respiratory: Normal respiratory effort, clear anterior, bronchial breath sounds RLL Abdomen: Abdomen soft.  Non-TTP. No ascites, distension, hepatosplenomegaly.   MSK: No deformities or effusions. Neuro: Awake and alert.  EOMI, moves all extremities. Speech fluent.    Psych: Sensorium intact and responding to questions, attention normal..  Judgment and insight appear normal.    Data Reviewed: I have personally reviewed following labs and imaging studies:  CBC: Recent Labs  Lab 08/09/19 1441  08/09/19 1830 08/10/19 0423 08/10/19 0443 08/11/19 0500 08/12/19 0304  WBC 6.1  --  6.5  --  2.6* 3.1* 5.0  NEUTROABS 4.9  --   --   --  2.2  --   --   HGB 10.6*   < > 10.4* 11.9* 9.4* 8.9* 8.7*  HCT 34.4*   < > 34.2* 35.0* 32.7* 29.3* 27.7*  MCV 87.3  --  88.4  --  89.3 86.9 85.2  PLT 213  --  203  --  220 227 206   < > = values in this interval not displayed.   Basic Metabolic Panel: Recent Labs  Lab 08/09/19 1441 08/09/19 1600 08/09/19 1637 08/10/19 0423 08/10/19 0443 08/11/19 0500 08/12/19 0304  NA 135  --  132* 134* 139 138 137  K 2.5*  --  4.0 3.1* 3.2* 3.2* 3.2*  CL 78*  --   --   --  80* 83* 80*  CO2 39*  --   --   --  37* 40* 42*  GLUCOSE 84  --   --   --  137* 351* 285*  BUN 80*  --   --   --  77* 80* 77*  CREATININE 2.25*  --   --   --  2.19* 2.16* 1.92*  CALCIUM 8.9  --   --   --  8.8* 8.5* 8.7*  MG  --  2.4  --   --   --   --  2.6*   GFR: Estimated Creatinine Clearance: 46.2 mL/min (A) (by C-G formula based on SCr  of 1.92 mg/dL (H)). Liver Function Tests: Recent Labs  Lab 08/09/19 1441 08/10/19 0443  AST 20 16  ALT 16 16  ALKPHOS 130* 112  BILITOT 1.2 1.5*  PROT 6.7 6.2*  ALBUMIN 3.5 3.1*   No results for input(s): LIPASE, AMYLASE in the last 168 hours. No results for input(s): AMMONIA in the last 168 hours. Coagulation Profile: No results for input(s): INR, PROTIME in the last 168 hours. Cardiac Enzymes: No results for input(s): CKTOTAL, CKMB, CKMBINDEX, TROPONINI in the last 168 hours. BNP (  last 3 results) No results for input(s): PROBNP in the last 8760 hours. HbA1C: Recent Labs    08/09/19 1830  HGBA1C 7.2*   CBG: Recent Labs  Lab 08/11/19 1147 08/11/19 1523 08/11/19 1942 08/11/19 2129 08/12/19 0747  GLUCAP 356* 278* 283* 263* 219*   Lipid Profile: Recent Labs    08/09/19 1441  TRIG 97   Thyroid Function Tests: No results for input(s): TSH, T4TOTAL, FREET4, T3FREE, THYROIDAB in the last 72 hours. Anemia Panel: Recent Labs    08/09/19 1450  FERRITIN 35   Urine analysis:    Component Value Date/Time   COLORURINE STRAW (A) 07/08/2019 0008   APPEARANCEUR CLEAR 07/08/2019 0008   LABSPEC 1.006 07/08/2019 0008   PHURINE 6.0 07/08/2019 0008   GLUCOSEU NEGATIVE 07/08/2019 0008   HGBUR NEGATIVE 07/08/2019 0008   BILIRUBINUR NEGATIVE 07/08/2019 0008   KETONESUR NEGATIVE 07/08/2019 0008   PROTEINUR NEGATIVE 07/08/2019 0008   UROBILINOGEN 0.2 10/26/2014 2206   NITRITE NEGATIVE 07/08/2019 0008   LEUKOCYTESUR NEGATIVE 07/08/2019 0008   Sepsis Labs: _0 (procalcitonin:4,lacticacidven:4)  ) Recent Results (from the past 240 hour(s))  Novel Coronavirus, NAA (Hosp order, Send-out to Ref Lab; TAT 18-24 hrs     Status: None   Collection Time: 08/05/19  2:29 PM   Specimen: Nasopharyngeal Swab; Respiratory  Result Value Ref Range Status   SARS-CoV-2, NAA NOT DETECTED NOT DETECTED Final    Comment: (NOTE) This nucleic acid amplification test was developed and its  performance characteristics determined by [employer]. Nucleic acid amplification tests include PCR and TMA. This test has not been FDA cleared or approved. This test has been authorized by FDA under an Emergency Use Authorization (EUA). This test is only authorized for the duration of time the declaration that circumstances exist justifying the authorization of the emergency use of in vitro diagnostic tests for detection of SARS-CoV-2 virus and/or diagnosis of COVID-19 infection under section 564(b)(1) of the Act, 21 U.S.C. 458KDX-8(P) (1), unless the authorization is terminated or revoked sooner. When diagnostic testing is negative, the possibility of a false negative result should be considered in the context of a patient's recent exposures and the presence of clinical signs and symptoms consistent with COVID-19. An individual without symptoms of COVID- 19 and who is not shedding SARS-CoV-2 vi rus would expect to have a negative (not detected) result in this assay. Performed At: [facility] [address] [place], [place] [id number] Rush Farmer MD QB:[id number]    [place]  Final    Comment: Performed at [facility], [place] [address], [place], [place] [address]  Blood Culture (routine x 2)     Status: None (Preliminary result)   Collection Time: 08/09/19  3:10 PM   Specimen: BLOOD RIGHT HAND  Result Value Ref Range Status   Specimen Description BLOOD RIGHT HAND  Final   Special Requests   Final    BOTTLES DRAWN AEROBIC AND ANAEROBIC Blood Culture adequate volume   Culture   Final    NO GROWTH 2 DAYS Performed at [facility], [place] [address], [place], [place] [address]    Report Status PENDING  Incomplete  SARS Coronavirus 2 [facility] order, Performed in [facility] hospital lab) Nasopharyngeal Nasopharyngeal Swab     Status: None   Collection Time: 08/09/19  3:27 PM   Specimen: Nasopharyngeal Swab  Result Value Ref  Range Status   SARS Coronavirus 2 NEGATIVE NEGATIVE Final    Comment: (NOTE) If result is NEGATIVE SARS-CoV-2  target nucleic acids are NOT DETECTED. The SARS-CoV-2 RNA is generally detectable in upper and lower  respiratory specimens during the acute phase of infection. The lowest  concentration of SARS-CoV-2 viral copies this assay can detect is 250  copies / mL. A negative result does not preclude SARS-CoV-2 infection  and should not be used as the sole basis for treatment or other  patient management decisions.  A negative result may occur with  improper specimen collection / handling, submission of specimen other  than nasopharyngeal swab, presence of viral mutation(s) within the  areas targeted by this assay, and inadequate number of viral copies  (<250 copies / mL). A negative result must be combined with clinical  observations, patient history, and epidemiological information. If result is POSITIVE SARS-CoV-2 target nucleic acids are DETECTED. The SARS-CoV-2 RNA is generally detectable in upper and lower  respiratory specimens dur ing the acute phase of infection.  Positive  results are indicative of active infection with SARS-CoV-2.  Clinical  correlation with patient history and other diagnostic information is  necessary to determine patient infection status.  Positive results do  not rule out bacterial infection or co-infection with other viruses. If result is PRESUMPTIVE POSTIVE SARS-CoV-2 nucleic acids MAY BE PRESENT.   A presumptive positive result was obtained on the submitted specimen  and confirmed on repeat testing.  While 2019 novel coronavirus  (SARS-CoV-2) nucleic acids may be present in the submitted sample  additional confirmatory testing may be necessary for epidemiological  and / or clinical management purposes  to differentiate between  SARS-CoV-2 and other Sarbecovirus currently known to infect humans.  If clinically indicated additional testing with an  alternate test  methodology [phone]) is advised. The SARS-CoV-2 RNA is generally  detectable in upper and lower respiratory sp ecimens during the acute  phase of infection. The expected result is Negative. Fact Sheet for Patients:  StrictlyIdeas.no Fact Sheet for Healthcare Providers: BankingDealers.co.za This test is not yet approved or cleared by the [place] FDA and has been authorized for detection and/or diagnosis of SARS-CoV-2 by FDA under an Emergency Use Authorization (EUA).  This EUA will remain in effect (meaning this test can be used) for the duration of the COVID-19 declaration under Section 564(b)(1) of the Act, 21 U.S.C. section 360bbb-3(b)(1), unless the authorization is terminated or revoked sooner. Performed at [facility], [place] [address], [place], [place] [address]   Blood Culture (routine x 2)     Status: None (Preliminary result)   Collection Time: 08/09/19  3:30 PM   Specimen: BLOOD RIGHT HAND  Result Value Ref Range Status   Specimen Description BLOOD RIGHT HAND  Final   Special Requests   Final    BOTTLES DRAWN AEROBIC ONLY Blood Culture results may not be optimal due to an inadequate volume of blood received in culture bottles   Culture   Final    NO GROWTH 2 DAYS Performed at [facility], [place] [address] [place], [place] [address]    Report Status PENDING  Incomplete  Culture, blood (routine x 2) Call MD if unable to obtain prior to antibiotics being given     Status: None (Preliminary result)   Collection Time: 08/09/19  7:50 PM   Specimen: BLOOD  Result Value Ref Range Status   Specimen Description BLOOD RIGHT ANTECUBITAL  Final   Special Requests   Final    BOTTLES DRAWN AEROBIC AND ANAEROBIC Blood Culture results may not be optimal due to  an inadequate volume of blood received in culture bottles   Culture   Final    NO GROWTH 2 DAYS Performed at [facility], [place] [address], [place], [place] [address]    Report Status PENDING  Incomplete  Culture, blood (routine x 2) Call MD if unable to obtain prior to antibiotics being given     Status: None (Preliminary result)   Collection Time: 08/09/19  8:10 PM   Specimen: BLOOD RIGHT FOREARM  Result Value Ref Range Status   Specimen Description BLOOD RIGHT FOREARM  Final   Special Requests   Final    BOTTLES DRAWN AEROBIC AND ANAEROBIC Blood Culture adequate volume   Culture   Final    NO GROWTH 2 DAYS Performed at [facility], [place] [address] [place], [place] [address]    Report Status PENDING  Incomplete  MRSA PCR Screening     Status: None   Collection Time: 08/10/19  9:10 PM   Specimen: Nasal Mucosa; Nasopharyngeal  Result Value Ref Range Status   MRSA by PCR NEGATIVE NEGATIVE Final    Comment:        The GeneXpert MRSA Assay (FDA approved for NASAL specimens only), is one component of a comprehensive MRSA colonization surveillance program. It is not intended to diagnose MRSA infection nor to guide or monitor treatment for MRSA infections. Performed at [facility], [place] [address], [place], [place] [address]          Radiology Studies: Dg Chest Port 1 View  Result Date: 08/11/2019 CLINICAL DATA:  Respiratory failure EXAM: PORTABLE CHEST 1 VIEW COMPARISON:  08/09/2019 FINDINGS: Cardiac enlargement. Severe diffuse bilateral airspace disease without significant change. Extensive bibasilar consolidation. Moderate right pleural effusion. IMPRESSION: Diffuse bilateral airspace disease with little change from 2 days ago. Probable congestive heart failure. Superimposed pneumonia not excluded. Electronically Signed   By: Franchot Gallo M.D.   On: 08/11/2019 08:24        Scheduled Meds: . apixaban  5 mg Oral BID  . aspirin EC  81 mg Oral Daily  . bisoprolol  5 mg Oral QHS  . Chlorhexidine Gluconate Cloth  6 each Topical Daily  . furosemide  60 mg Intravenous Q12H  . hydrocortisone    Rectal BID  . insulin aspart  0-20 Units Subcutaneous TID WC  . insulin aspart  0-5 Units Subcutaneous QHS  . ipratropium-albuterol  3 mL Nebulization TID  . isosorbide-hydrALAZINE  1 tablet Oral TID  . mouth rinse  15 mL Mouth Rinse BID  . methylPREDNISolone (SOLU-MEDROL) injection  40 mg Intravenous BID  . nicotine  14 mg Transdermal Daily  . oxybutynin  5 mg Oral TID AC & HS  . pantoprazole  40 mg Oral BID  . polyethylene glycol  17 g Oral Daily  . rosuvastatin  20 mg Oral q1800  . tamsulosin  0.4 mg Oral QPC supper   Continuous Infusions: . cefTRIAXone (ROCEPHIN)  IV 1 g (08/11/19 1800)     LOS: 3 days    Time spent: 30 minutes   Noe Gens, AG-ACNP-[facility] 08/12/2019, 8:45 AM     Please page through AMION:  [email or web address] Password [email or web address] If 7PM-7AM, please contact night-coverage       Attending MD Note:  I have seen and examined the patient with nurse practitioner/physician assistant and agree with the note above which has been edited to reflect our agreed upon history, exam,  and assessment/plan.   I have personally reviewed the orders for the patient, which were made under my direction.        [place]

## 2019-08-13 ENCOUNTER — Inpatient Hospital Stay (HOSPITAL_COMMUNITY): Admission: RE | Admit: 2019-08-13 | Payer: No Typology Code available for payment source | Source: Ambulatory Visit

## 2019-08-13 ENCOUNTER — Inpatient Hospital Stay (HOSPITAL_COMMUNITY): Payer: No Typology Code available for payment source

## 2019-08-13 LAB — CBC
HCT: 29.4 % — ABNORMAL LOW (ref 39.0–52.0)
Hemoglobin: 8.6 g/dL — ABNORMAL LOW (ref 13.0–17.0)
MCH: 26.1 pg (ref 26.0–34.0)
MCHC: 29.3 g/dL — ABNORMAL LOW (ref 30.0–36.0)
MCV: 89.1 fL (ref 80.0–100.0)
Platelets: 211 10*3/uL (ref 150–400)
RBC: 3.3 MIL/uL — ABNORMAL LOW (ref 4.22–5.81)
RDW: 21.2 % — ABNORMAL HIGH (ref 11.5–15.5)
WBC: 6.2 10*3/uL (ref 4.0–10.5)
nRBC: 0.3 % — ABNORMAL HIGH (ref 0.0–0.2)

## 2019-08-13 LAB — BASIC METABOLIC PANEL
Anion gap: 13 (ref 5–15)
BUN: 68 mg/dL — ABNORMAL HIGH (ref 8–23)
CO2: 45 mmol/L — ABNORMAL HIGH (ref 22–32)
Calcium: 8.7 mg/dL — ABNORMAL LOW (ref 8.9–10.3)
Chloride: 81 mmol/L — ABNORMAL LOW (ref 98–111)
Creatinine, Ser: 1.82 mg/dL — ABNORMAL HIGH (ref 0.61–1.24)
GFR calc Af Amer: 42 mL/min — ABNORMAL LOW (ref >60)
GFR calc non Af Amer: 37 mL/min — ABNORMAL LOW (ref >60)
Glucose, Bld: 241 mg/dL — ABNORMAL HIGH (ref 70–99)
Potassium: 3.3 mmol/L — ABNORMAL LOW (ref 3.5–5.1)
Sodium: 139 mmol/L (ref 135–145)

## 2019-08-13 LAB — PROTEIN, TOTAL: Total Protein: 6 g/dL — ABNORMAL LOW (ref 6.5–8.1)

## 2019-08-13 LAB — GLUCOSE, CAPILLARY
Glucose-Capillary: 205 mg/dL — ABNORMAL HIGH (ref 70–99)
Glucose-Capillary: 201 mg/dL — ABNORMAL HIGH (ref 70–99)
Glucose-Capillary: 121 mg/dL — ABNORMAL HIGH (ref 70–99)
Glucose-Capillary: 198 mg/dL — ABNORMAL HIGH (ref 70–99)
Glucose-Capillary: 186 mg/dL — ABNORMAL HIGH (ref 70–99)

## 2019-08-13 LAB — GRAM STAIN

## 2019-08-13 LAB — BODY FLUID CELL COUNT WITH DIFFERENTIAL
Lymphs, Fluid: 46 %
Monocyte-Macrophage-Serous Fluid: 32 % — ABNORMAL LOW (ref 50–90)
Neutrophil Count, Fluid: 22 % (ref 0–25)
Total Nucleated Cell Count, Fluid: 417 cu mm (ref 0–1000)

## 2019-08-13 LAB — PROTEIN, PLEURAL OR PERITONEAL FLUID: Total protein, fluid: 3.5 g/dL

## 2019-08-13 LAB — LACTATE DEHYDROGENASE, PLEURAL OR PERITONEAL FLUID: LD, Fluid: 134 U/L — ABNORMAL HIGH (ref 3–23)

## 2019-08-13 LAB — MAGNESIUM: Magnesium: 2.6 mg/dL — ABNORMAL HIGH (ref 1.7–2.4)

## 2019-08-13 LAB — GLUCOSE, PLEURAL OR PERITONEAL FLUID: Glucose, Fluid: 246 mg/dL

## 2019-08-13 LAB — LACTATE DEHYDROGENASE: LDH: 164 U/L (ref 98–192)

## 2019-08-13 MED ORDER — BISMUTH SUBSALICYLATE 262 MG PO CHEW: 524.0000 mg | CHEWABLE_TABLET | Freq: Four times a day (QID) | ORAL | Status: DC | PRN

## 2019-08-13 MED ORDER — POTASSIUM CHLORIDE CRYS ER 20 MEQ PO TBCR
30.0000 meq | EXTENDED_RELEASE_TABLET | Freq: Two times a day (BID) | ORAL | Status: DC
  Administered 2019-08-13 – 2019-08-15 (×5): 30 meq via ORAL
  Filled 2019-08-13 (×5): qty 1

## 2019-08-13 MED ORDER — INSULIN GLARGINE 100 UNIT/ML ~~LOC~~ SOLN
20.0000 [IU] | Freq: Every day | SUBCUTANEOUS | Status: DC
  Administered 2019-08-13 – 2019-08-15 (×3): 20 [IU] via SUBCUTANEOUS
  Filled 2019-08-13 (×4): qty 0.2

## 2019-08-13 MED ORDER — LIDOCAINE HCL (PF) 1 % IJ SOLN
INTRAMUSCULAR | Status: AC
  Filled 2019-08-13: qty 30

## 2019-08-13 MED ORDER — SACCHAROMYCES BOULARDII 250 MG PO CAPS
250.0000 mg | ORAL_CAPSULE | Freq: Two times a day (BID) | ORAL | Status: DC
  Administered 2019-08-13 – 2019-08-15 (×4): 250 mg via ORAL
  Filled 2019-08-13 (×4): qty 1

## 2019-08-13 NOTE — Progress Notes (Signed)
[facility] Progress Note Patient Name: Nicholas Caldwell DOB: 1946-10-10 MRN: [id number]   Date of Service  08/13/2019  HPI/Events of Note  Request for pepto bismal. No belly pain, was constipated earlier but then had some soft stool and indigestion  eICU Interventions  Order placed for probiotics Asked RN to let me know if he has diarrhea        [name] G [name] 08/13/2019, 8:16 PM

## 2019-08-13 NOTE — Progress Notes (Signed)
PROGRESS NOTE    Teron Blais  NWG:[id number] DOB: 11/16/1945 DOA: 08/09/2019 PCP: [facility]    Brief Narrative:  Mr. Fouche is a 73 y.o. M with HTN, pAF on Eliquis, ACD, sCHF EF 40-45%, DM with neuropathy, WC bound, COPD on 3L O2, OSA who presneted with 3 days productive cough and chills.  In the ER, he had hypoxia requiring BiPAP as well as hypercarbia and BNP >2000.  CTA chest showed no PE but large RIGHT pleural effusion. Improved with diuresis. Underwent thoracentesis on 10/10.     Assessment & Plan:  Acute on Chronic Hypoxemic / Hypercarbic Respiratory Failure In setting of RLL pneumonia with large compressive effusion  -at 4L O2 baseline, wean for sats >88% -mobilize, push pulmonary hygiene / flutter valve  Recurrent RLL PNA with Pleural Effusion  Status post thoracentesis on 10/10 with pleural protein ratio 0.6, may reflect pleural fluid changes with chronic diuresis -appreciate IR assistance with thoracentesis  -Appreciate SLP, no overt aspiration  -rocephin D5/7 -follow pleural studies  -follow up with pulmonary as outpatient  -likely will need repeat CT imaging 4-6 weeks to ensure resolution of RLL   COPD with Acute Exacerbation  -discontinue steroids -duoneb Q6 + PRN albuterol  -hold home symbicort   Acute on chronic systolic CHF Stable.  I/O- 1.6L UOP in 24/h, net neg 1L 24/h -continue lasix 60 mg BID with potassium supplementation -plan to transition to oral lasix 10/11 -continue Bidil, beta blocker -hold home torsemide, zaroxolyn  Paroxysmal atrial fibrillation Rate controlled -continue beta blocker  -hold eliquis for thoracentesis, plan to resume 10/11  Suspected OSA PSG in 11/2018 at the [facility] on home CPAP -continue BiPAP QHS + PRN daytime sleep  -transition back to home CPAP at discharge   Hx HTN, AF/Flutter, HLD -continue lower extremity compression wraps -continue ASA, zebeta, bidil, crestor -hold eliquis for  thoracentesis, ok to resume 24 post procedure -monitor I/O's   Hypokalemia -monitor, replace as indicated   CKD III Creatinine baseline 1.6-2 -trend BMP / UOP  -avoid nephrotoxic agents as able  Anemia  ACD with superimposed critical illness -trend CBC -transfuse for Hgb <7%  Diabetes with Steroid Induced Hyperglycemia  -glucose range 150-360 -steroids stopped 10/10, monitor glucose  -SSI -increase lantus to 20 units QD  Peripheral Neuropathy  -PRN percocet   BPH -continue flomax   Stage I Pressure Injury Right buttock, present on admit  -wound care per nursing / WOC  -mobilize        DVT prophylaxis: Eliquis, on hold since 10/9 for thoracentesis  Code Status: Full Code  Family Communication: Patient updated on plan of care     Consultants:   IR   Procedures:   SLP evaluation 10/8 >> regular diet, thin liquids  Thoracentesis 10/10 >> exudative by LDH >>   Antimicrobials:   Ceftriaxone 10/6 >>    Subjective: Pt states he is nervous for thoracentesis.  Denies complaints otherwise. I/O- 1.6L UOP in 24/h, net neg 1L. Glucose 150-350. Wore bipap overnight.   Objective: Vitals:   08/13/19 1100 08/13/19 1200 08/13/19 1300 08/13/19 1350  BP: 113/63 (!) 94/51 (!) 102/48   Pulse: 68 (!) 59 69 78  Resp: (!) 23 (!) 30 (!) 25 (!) 22  Temp:  98.5 F (36.9 C)    TempSrc:  Oral    SpO2: 96% 98% 96% 92%  Weight:        Intake/Output Summary (Last 24 hours) at 08/13/2019 1517 Last data filed at 08/13/2019  1200 Gross per 24 hour  Intake 440 ml  Output 1425 ml  Net -985 ml   Filed Weights   08/10/19 2000  Weight: 115 kg    Examination: General appearance: chronically ill appearing male lying in bed in NAD HEENT: MM pink/moist, [place] O2, anicteric, conjunctiva pink Skin: Warm/dry, no rashes or lesions Cardiac: s1s2 RRR, SR on monitor, no murmurs, cap refill <3 sec, LE's wrapped in compressive dressings, radial pulses +2 symmetrical  Respiratory:  even/non-labored on [place] O2, lungs bilaterally diminished  Abdomen: abdomen obese/soft, non-tender to palpation, no distention  MSK: No acute deformities or effusions. Neuro: AAOx4, speech clear, MAE  Psych: calm / appropriate, insight appears normal    Data Reviewed: I have personally reviewed following labs and imaging studies:  CBC: Recent Labs  Lab 08/09/19 1441  08/09/19 1830 08/10/19 0423 08/10/19 0443 08/11/19 0500 08/12/19 0304 08/13/19 0257  WBC 6.1  --  6.5  --  2.6* 3.1* 5.0 6.2  NEUTROABS 4.9  --   --   --  2.2  --   --   --   HGB 10.6*   < > 10.4* 11.9* 9.4* 8.9* 8.7* 8.6*  HCT 34.4*   < > 34.2* 35.0* 32.7* 29.3* 27.7* 29.4*  MCV 87.3  --  88.4  --  89.3 86.9 85.2 89.1  PLT 213  --  203  --  220 227 206 211   < > = values in this interval not displayed.   Basic Metabolic Panel: Recent Labs  Lab 08/09/19 1441 08/09/19 1600  08/10/19 0423 08/10/19 0443 08/11/19 0500 08/12/19 0304 08/13/19 0257  NA 135  --    < > 134* 139 138 137 139  K 2.5*  --    < > 3.1* 3.2* 3.2* 3.2* 3.3*  CL 78*  --   --   --  80* 83* 80* 81*  CO2 39*  --   --   --  37* 40* 42* 45*  GLUCOSE 84  --   --   --  137* 351* 285* 241*  BUN 80*  --   --   --  77* 80* 77* 68*  CREATININE 2.25*  --   --   --  2.19* 2.16* 1.92* 1.82*  CALCIUM 8.9  --   --   --  8.8* 8.5* 8.7* 8.7*  MG  --  2.4  --   --   --   --  2.6* 2.6*   < > = values in this interval not displayed.   GFR: Estimated Creatinine Clearance: 48.8 mL/min (A) (by C-G formula based on SCr of 1.82 mg/dL (H)). Liver Function Tests: Recent Labs  Lab 08/09/19 1441 08/10/19 0443 08/13/19 0257  AST 20 16  --   ALT 16 16  --   ALKPHOS 130* 112  --   BILITOT 1.2 1.5*  --   PROT 6.7 6.2* 6.0*  ALBUMIN 3.5 3.1*  --    No results for input(s): LIPASE, AMYLASE in the last 168 hours. No results for input(s): AMMONIA in the last 168 hours. Coagulation Profile: No results for input(s): INR, PROTIME in the last 168 hours. Cardiac  Enzymes: No results for input(s): CKTOTAL, CKMB, CKMBINDEX, TROPONINI in the last 168 hours. BNP (last 3 results) No results for input(s): PROBNP in the last 8760 hours. HbA1C: No results for input(s): HGBA1C in the last 72 hours. CBG: Recent Labs  Lab 08/12/19 1135 08/12/19 1536 08/12/19 2134 08/13/19 0823 08/13/19 1216  GLUCAP 216* 236* 314* 205* 201*   Lipid Profile: No results for input(s): CHOL, HDL, LDLCALC, TRIG, CHOLHDL, LDLDIRECT in the last 72 hours. Thyroid Function Tests: No results for input(s): TSH, T4TOTAL, FREET4, T3FREE, THYROIDAB in the last 72 hours. Anemia Panel: No results for input(s): VITAMINB12, FOLATE, FERRITIN, TIBC, IRON, RETICCTPCT in the last 72 hours. Urine analysis:    Component Value Date/Time   COLORURINE STRAW (A) 07/08/2019 0008   APPEARANCEUR CLEAR 07/08/2019 0008   LABSPEC 1.006 07/08/2019 0008   PHURINE 6.0 07/08/2019 0008   GLUCOSEU NEGATIVE 07/08/2019 0008   HGBUR NEGATIVE 07/08/2019 0008   BILIRUBINUR NEGATIVE 07/08/2019 0008   KETONESUR NEGATIVE 07/08/2019 0008   PROTEINUR NEGATIVE 07/08/2019 0008   UROBILINOGEN 0.2 10/26/2014 2206   NITRITE NEGATIVE 07/08/2019 0008   LEUKOCYTESUR NEGATIVE 07/08/2019 0008   Sepsis Labs: _0 (procalcitonin:4,lacticacidven:4)  ) Recent Results (from the past 240 hour(s))  Novel Coronavirus, NAA (Hosp order, Send-out to Ref Lab; TAT 18-24 hrs     Status: None   Collection Time: 08/05/19  2:29 PM   Specimen: Nasopharyngeal Swab; Respiratory  Result Value Ref Range Status   SARS-CoV-2, NAA NOT DETECTED NOT DETECTED Final    Comment: (NOTE) This nucleic acid amplification test was developed and its performance characteristics determined by [employer]. Nucleic acid amplification tests include PCR and TMA. This test has not been FDA cleared or approved. This test has been authorized by FDA under an Emergency Use Authorization (EUA). This test is only authorized for the duration of  time the declaration that circumstances exist justifying the authorization of the emergency use of in vitro diagnostic tests for detection of SARS-CoV-2 virus and/or diagnosis of COVID-19 infection under section 564(b)(1) of the Act, 21 U.S.C. 161WRU-0(A) (1), unless the authorization is terminated or revoked sooner. When diagnostic testing is negative, the possibility of a false negative result should be considered in the context of a patient's recent exposures and the presence of clinical signs and symptoms consistent with COVID-19. An individual without symptoms of COVID- 19 and who is not shedding SARS-CoV-2 vi rus would expect to have a negative (not detected) result in this assay. Performed At: [facility] [address] [place], [place] [id number] Rush Farmer MD YN:[id number]    [place]  Final    Comment: Performed at [facility], [place] [address], [place], [place] [address]  Blood Culture (routine x 2)     Status: None (Preliminary result)   Collection Time: 08/09/19  3:10 PM   Specimen: BLOOD RIGHT HAND  Result Value Ref Range Status   Specimen Description BLOOD RIGHT HAND  Final   Special Requests   Final    BOTTLES DRAWN AEROBIC AND ANAEROBIC Blood Culture adequate volume   Culture   Final    NO GROWTH 4 DAYS Performed at [facility], [place] [address], [place], [place] [address]    Report Status PENDING  Incomplete  SARS Coronavirus 2 [facility] order, Performed in [facility] hospital lab) Nasopharyngeal Nasopharyngeal Swab     Status: None   Collection Time: 08/09/19  3:27 PM   Specimen: Nasopharyngeal Swab  Result Value Ref Range Status   SARS Coronavirus 2 NEGATIVE NEGATIVE Final    Comment: (NOTE) If result is NEGATIVE SARS-CoV-2 target nucleic acids are NOT DETECTED. The SARS-CoV-2 RNA is generally detectable in upper and lower  respiratory specimens during the acute phase of infection. The lowest  concentration  of SARS-CoV-2 viral copies this assay can  detect is 250  copies / mL. A negative result does not preclude SARS-CoV-2 infection  and should not be used as the sole basis for treatment or other  patient management decisions.  A negative result may occur with  improper specimen collection / handling, submission of specimen other  than nasopharyngeal swab, presence of viral mutation(s) within the  areas targeted by this assay, and inadequate number of viral copies  (<250 copies / mL). A negative result must be combined with clinical  observations, patient history, and epidemiological information. If result is POSITIVE SARS-CoV-2 target nucleic acids are DETECTED. The SARS-CoV-2 RNA is generally detectable in upper and lower  respiratory specimens dur ing the acute phase of infection.  Positive  results are indicative of active infection with SARS-CoV-2.  Clinical  correlation with patient history and other diagnostic information is  necessary to determine patient infection status.  Positive results do  not rule out bacterial infection or co-infection with other viruses. If result is PRESUMPTIVE POSTIVE SARS-CoV-2 nucleic acids MAY BE PRESENT.   A presumptive positive result was obtained on the submitted specimen  and confirmed on repeat testing.  While 2019 novel coronavirus  (SARS-CoV-2) nucleic acids may be present in the submitted sample  additional confirmatory testing may be necessary for epidemiological  and / or clinical management purposes  to differentiate between  SARS-CoV-2 and other Sarbecovirus currently known to infect humans.  If clinically indicated additional testing with an alternate test  methodology [phone]) is advised. The SARS-CoV-2 RNA is generally  detectable in upper and lower respiratory sp ecimens during the acute  phase of infection. The expected result is Negative. Fact Sheet for Patients:  StrictlyIdeas.no Fact Sheet for Healthcare  Providers: BankingDealers.co.za This test is not yet approved or cleared by the [place] FDA and has been authorized for detection and/or diagnosis of SARS-CoV-2 by FDA under an Emergency Use Authorization (EUA).  This EUA will remain in effect (meaning this test can be used) for the duration of the COVID-19 declaration under Section 564(b)(1) of the Act, 21 U.S.C. section 360bbb-3(b)(1), unless the authorization is terminated or revoked sooner. Performed at [facility], [place] [address], [place], [place] [address]   Blood Culture (routine x 2)     Status: None (Preliminary result)   Collection Time: 08/09/19  3:30 PM   Specimen: BLOOD RIGHT HAND  Result Value Ref Range Status   Specimen Description BLOOD RIGHT HAND  Final   Special Requests   Final    BOTTLES DRAWN AEROBIC ONLY Blood Culture results may not be optimal due to an inadequate volume of blood received in culture bottles   Culture   Final    NO GROWTH 4 DAYS Performed at [facility], [place] [address] [place], [place] [address]    Report Status PENDING  Incomplete  Culture, blood (routine x 2) Call MD if unable to obtain prior to antibiotics being given     Status: None (Preliminary result)   Collection Time: 08/09/19  7:50 PM   Specimen: BLOOD  Result Value Ref Range Status   Specimen Description BLOOD RIGHT ANTECUBITAL  Final   Special Requests   Final    BOTTLES DRAWN AEROBIC AND ANAEROBIC Blood Culture results may not be optimal due to an inadequate volume of blood received in culture bottles   Culture   Final    NO GROWTH 4 DAYS Performed at [facility], [place] [address] [place], [place] [address]  Report Status PENDING  Incomplete  Culture, blood (routine x 2) Call MD if unable to obtain prior to antibiotics being given     Status: None (Preliminary result)   Collection Time: 08/09/19  8:10 PM   Specimen: BLOOD RIGHT FOREARM  Result Value Ref Range Status   Specimen  Description BLOOD RIGHT FOREARM  Final   Special Requests   Final    BOTTLES DRAWN AEROBIC AND ANAEROBIC Blood Culture adequate volume   Culture   Final    NO GROWTH 4 DAYS Performed at [facility], [place] [address] [place], [place] [address]    Report Status PENDING  Incomplete  MRSA PCR Screening     Status: None   Collection Time: 08/10/19  9:10 PM   Specimen: Nasal Mucosa; Nasopharyngeal  Result Value Ref Range Status   MRSA by PCR NEGATIVE NEGATIVE Final    Comment:        The GeneXpert MRSA Assay (FDA approved for NASAL specimens only), is one component of a comprehensive MRSA colonization surveillance program. It is not intended to diagnose MRSA infection nor to guide or monitor treatment for MRSA infections. Performed at [facility], [place] [address], [place], [place] [address]   Gram stain     Status: None   Collection Time: 08/13/19 10:24 AM   Specimen: Pleura  Result Value Ref Range Status   Specimen Description PLEURAL RIGHT  Final   Special Requests NONE  Final   Gram Stain   Final    FEW WBC PRESENT, PREDOMINANTLY MONONUCLEAR NO ORGANISMS SEEN Performed at [facility], [address], [place], [place] [address]    Report Status 08/13/2019 FINAL  Final         Radiology Studies: Dg Chest 1 View  Result Date: 08/13/2019 CLINICAL DATA:  Status post thoracentesis. EXAM: CHEST  1 VIEW COMPARISON:  Same day. FINDINGS: Stable cardiomegaly. No pneumothorax is noted. Stable bilateral lung opacities are noted concerning for edema or pneumonia. Grossly stable bilateral pleural effusions are noted. Bony thorax is unremarkable. IMPRESSION: Stable bilateral lung opacities and pleural effusions as described above. No pneumothorax is noted status post thoracentesis. Electronically Signed   By: Marijo Conception M.D.   On: 08/13/2019 10:58   Dg Chest Port 1 View  Result Date: 08/13/2019 CLINICAL DATA:  Respiratory failure. EXAM: PORTABLE CHEST 1 VIEW  COMPARISON:  Radiograph of August 11, 2019. FINDINGS: Stable cardiomegaly. Stable bilateral lung opacities are noted concerning for edema or pneumonia. Bilateral pleural effusions are noted. No pneumothorax is noted. Bony thorax is unremarkable. IMPRESSION: Stable bilateral lung opacities as described above. Electronically Signed   By: Marijo Conception M.D.   On: 08/13/2019 09:44        Scheduled Meds: . aspirin EC  81 mg Oral Daily  . bisoprolol  5 mg Oral QHS  . Chlorhexidine Gluconate Cloth  6 each Topical Daily  . furosemide  60 mg Intravenous Q12H  . hydrocortisone   Rectal BID  . insulin aspart  0-20 Units Subcutaneous TID WC  . insulin aspart  0-5 Units Subcutaneous QHS  . insulin glargine  20 Units Subcutaneous Daily  . ipratropium-albuterol  3 mL Nebulization TID  . isosorbide-hydrALAZINE  1 tablet Oral TID  . lidocaine (PF)      . mouth rinse  15 mL Mouth Rinse BID  . nicotine  14 mg Transdermal Daily  . oxybutynin  5 mg Oral TID AC & HS  .  pantoprazole  40 mg Oral BID  . polyethylene glycol  17 g Oral Daily  . potassium chloride  30 mEq Oral BID  . rosuvastatin  20 mg Oral q1800  . tamsulosin  0.4 mg Oral QPC supper   Continuous Infusions: . cefTRIAXone (ROCEPHIN)  IV       LOS: 4 days    Time spent: 30 minutes   Noe Gens, AG-ACNP-S [place]    [facility] 08/13/2019, 3:17 PM     Please page through AMION:  [email or web address] Password [email or web address] If 7PM-7AM, please contact night-coverage     Attending MD Note:  I have seen and examined the patient with nurse practitioner/physician assistant and agree with the note above which has been edited to reflect our agreed upon history, exam, and assessment/plan.   I have personally reviewed the orders for the patient, which were made under my direction.    Mr. Grundman is a 73 y.o. M with HTN, pAF on Eliquis, ACD, sCHF EF 40-45%, DM with neuropathy, WC bound, COPD on 3L O2, OSA who presneted  with 3 days productive cough and chills.  Found to have respiratory failure from pneumonia and CHF.    General appearance: Obese adult male, alert and in no acute distress.   HEENT: Anicteric, conjunctiva pink, lids and lashes normal. No nasal deformity, discharge, epistaxis.  Lips moist, edentulous, oropharynx moist, no oral lesions, hearing normal.     Skin: Warm and dry.  No suspicious rashes or lesions. Cardiac: RRR, no murmurs appreciated.  Pitting edema above his compressive wrap.      Respiratory: Normal respiratory rate and rhythm.  No wheezing.  Crackles bilaterally at bases, lung sounds even, good air entry.    Abdomen: Abdomen soft.  No tenderness to palpation or guarding. No ascites, distension, hepatosplenomegaly.   MSK: No deformities or effusions of the large joints of the upper or lower extremities bilaterally. Neuro: Awake and alert. Naming is grossly intact, and the patient's recall, recent and remote, as well as general fund of knowledge seem within normal limits.  Muscle tone normal, without fasciculations.  Moves all extremities equally and with normal coordination.  Marland Kitchen Speech fluent.    Psych: Sensorium intact and responding to questions, attention normal. Affect normal.  Judgment and insight appear normal.            Acute on Chronic Hypoxemic / Hypercarbic Respiratory Failure In setting of RLL pneumonia with large compressive effusion  On 3-4L at baseline.     Recurrent RLL PNA  Large RIGHT Pleural Effusion  Admitted and started on ceftriaxone.  SLP consulted, no overt aspiration.   Effusion with associated consolidation on CT.  Congestive vs parapneumonic vs malignant.    Patient now mentating at baseline, taking orals.  Afebrile, but RR 19-29 in last 24 hours.   Thoracentesis obtained this morning, borderline [place] criteria, but suspect this is from previous diuresis. -Continue ceftriaxone day 5 of 7  -Follow-up pleural fluid culture, cytology -Repeat  CT imaging 4-6 weeks to ensure resolution of RLL   COPD with Acute Exacerbation  Minimal wheezing, patient refusing Solu-Medrol. -Stop solumedrol -Continue BDs, scheduled and PRN -Hold home symbicort   Acute on chronic systolic CHF Stable.  Negative 850cc yesterday, 4.4L on admission. Cr stable, K low. -Furosemide 60 mg IV twice a day  -K supplement -Strict I/Os, daily weights, telemetry  -Daily monitoring renal function -Continue Bidil, BB -Hold home torsemide, zaroxolyn  Paroxysmal atrial fibrillation Rate controlled -Continue BB -Resume  eliquis post thoracentesis   Suspected OSA -Continue BiPAP QHS + PRN daytime sleep  -Plan for outpatient PSG  Hx HTN, AF/Flutter, HLD --Continue lower extremity compression wraps -Continue ASA, zebeta, bidil, crestor -Hold eliquis for thoracentesis, ok to resume 24 post procedure  Hypokalemia -monitor, replace as indicated, increase dose  CKD III Creatinine baseline 1.6-2, stable relative to baseline today -trend BMP / UOP  -avoid nephrotoxic agents as able  Anemia  ACD with superimposed critical illness -trend CBC -transfuse for Hgb <7%  Diabetes with Steroid Induced Hyperglycemia  Poorly controlled -wean steroids as clinical status allows -Continue SSI -Continue Lantus, increase dose   Peripheral Neuropathy  -PRN percocet   BPH --Continue flomax   Stage I Pressure Injury Right buttock, present on admit  -wound care per nursing / WOC  -mobilize        MDM The below labs and imaging reports reviewed and summarized above.  Medication management as above.  Including anticoagulation management.  The patient was admitted for severe exacerbation of CHF, complicated by pneumonia with pleural effusion.  He is undergone thoracentesis, but still feels fluid overloaded on exam.  We will continue IV Lasix, anticipate transitioning to oral Lasix within 24 to 48 hours and discharge on Monday or Tuesday.    [place]

## 2019-08-13 NOTE — Procedures (Signed)
PROCEDURE SUMMARY:  Successful US guided diagnostic and therapeutic right thoracentesis. Yielded 1.0 liters of cloudy, pink fluid. Pt tolerated procedure well. No immediate complications.  Specimen was sent for labs. CXR ordered.  EBL < 5 mL  Docia Barrier PA-C 08/13/2019 10:07 AM

## 2019-08-14 ENCOUNTER — Inpatient Hospital Stay (HOSPITAL_COMMUNITY): Payer: No Typology Code available for payment source

## 2019-08-14 LAB — CBC
HCT: 29.8 % — ABNORMAL LOW (ref 39.0–52.0)
Hemoglobin: 8.7 g/dL — ABNORMAL LOW (ref 13.0–17.0)
MCH: 25.7 pg — ABNORMAL LOW (ref 26.0–34.0)
MCHC: 29.2 g/dL — ABNORMAL LOW (ref 30.0–36.0)
MCV: 88.2 fL (ref 80.0–100.0)
Platelets: 221 10*3/uL (ref 150–400)
RBC: 3.38 MIL/uL — ABNORMAL LOW (ref 4.22–5.81)
RDW: 20.9 % — ABNORMAL HIGH (ref 11.5–15.5)
WBC: 6.7 10*3/uL (ref 4.0–10.5)
nRBC: 0 % (ref 0.0–0.2)

## 2019-08-14 LAB — BASIC METABOLIC PANEL
Anion gap: 13 (ref 5–15)
BUN: 59 mg/dL — ABNORMAL HIGH (ref 8–23)
CO2: 42 mmol/L — ABNORMAL HIGH (ref 22–32)
Calcium: 8.4 mg/dL — ABNORMAL LOW (ref 8.9–10.3)
Chloride: 84 mmol/L — ABNORMAL LOW (ref 98–111)
Creatinine, Ser: 1.59 mg/dL — ABNORMAL HIGH (ref 0.61–1.24)
GFR calc Af Amer: 50 mL/min — ABNORMAL LOW (ref >60)
GFR calc non Af Amer: 43 mL/min — ABNORMAL LOW (ref >60)
Glucose, Bld: 167 mg/dL — ABNORMAL HIGH (ref 70–99)
Potassium: 3.3 mmol/L — ABNORMAL LOW (ref 3.5–5.1)
Sodium: 139 mmol/L (ref 135–145)

## 2019-08-14 LAB — ACID FAST SMEAR (AFB, MYCOBACTERIA): Acid Fast Smear: NEGATIVE

## 2019-08-14 LAB — CULTURE, BLOOD (ROUTINE X 2)
Culture: NO GROWTH
Culture: NO GROWTH
Culture: NO GROWTH
Culture: NO GROWTH
Special Requests: ADEQUATE
Special Requests: ADEQUATE

## 2019-08-14 LAB — GLUCOSE, CAPILLARY
Glucose-Capillary: 123 mg/dL — ABNORMAL HIGH (ref 70–99)
Glucose-Capillary: 169 mg/dL — ABNORMAL HIGH (ref 70–99)
Glucose-Capillary: 188 mg/dL — ABNORMAL HIGH (ref 70–99)
Glucose-Capillary: 238 mg/dL — ABNORMAL HIGH (ref 70–99)
Glucose-Capillary: 282 mg/dL — ABNORMAL HIGH (ref 70–99)

## 2019-08-14 LAB — PH, BODY FLUID: pH, Body Fluid: 7.6

## 2019-08-14 MED ORDER — BUDESONIDE 0.5 MG/2ML IN SUSP
0.5000 mg | Freq: Two times a day (BID) | RESPIRATORY_TRACT | Status: DC
  Administered 2019-08-14 – 2019-08-15 (×2): 0.5 mg via RESPIRATORY_TRACT
  Filled 2019-08-14 (×2): qty 2

## 2019-08-14 MED ORDER — POTASSIUM CHLORIDE CRYS ER 20 MEQ PO TBCR
20.0000 meq | EXTENDED_RELEASE_TABLET | Freq: Once | ORAL | Status: AC
  Administered 2019-08-14: 20 meq via ORAL
  Filled 2019-08-14: qty 1

## 2019-08-14 MED ORDER — BUDESONIDE 0.25 MG/2ML IN SUSP: 0.2500 mg | Freq: Two times a day (BID) | RESPIRATORY_TRACT | Status: DC

## 2019-08-14 MED ORDER — APIXABAN 5 MG PO TABS
5.0000 mg | ORAL_TABLET | Freq: Two times a day (BID) | ORAL | Status: DC
  Administered 2019-08-14 – 2019-08-15 (×3): 5 mg via ORAL
  Filled 2019-08-14 (×3): qty 1

## 2019-08-14 MED ORDER — INFLUENZA VAC A&B SA ADJ QUAD 0.5 ML IM PRSY
0.5000 mL | PREFILLED_SYRINGE | INTRAMUSCULAR | Status: AC
  Administered 2019-08-15: 0.5 mL via INTRAMUSCULAR
  Filled 2019-08-14: qty 0.5

## 2019-08-14 MED ORDER — TORSEMIDE 20 MG PO TABS
80.0000 mg | ORAL_TABLET | Freq: Two times a day (BID) | ORAL | Status: DC
  Administered 2019-08-14 – 2019-08-15 (×3): 80 mg via ORAL
  Filled 2019-08-14 (×3): qty 4

## 2019-08-14 MED ORDER — ARFORMOTEROL TARTRATE 15 MCG/2ML IN NEBU
15.0000 ug | INHALATION_SOLUTION | Freq: Two times a day (BID) | RESPIRATORY_TRACT | Status: DC
  Administered 2019-08-14 – 2019-08-15 (×3): 15 ug via RESPIRATORY_TRACT
  Filled 2019-08-14 (×2): qty 2

## 2019-08-14 NOTE — Discharge Summary (Signed)
Physician Discharge Summary  Min Collymore PJA:[id number] DOB: 01-13-1946 DOA: 08/09/2019  PCP: [name], MD  Admit date: 08/09/2019 Discharge date: 08/15/2019  Admitted From: Home Disposition:  Home   Recommendations for Outpatient Follow-up:  1. Follow up with PCP in 1 week 2. Please obtain BMP in one week to follow up Cr/K back on oral diuretic 3. Follow up with [name], Cardiology re: cardioversion 4. Dr. Marjo Bicker: Please refer to Pulmonology 5. Please follow up on pleural cultures and cytology from 10/10 6. Repeat CT imaging 4-6 weeks to ensure resolution of RLL      Home Health: PT/OT Equipment/Devices: No new home health needs identified at discharge.  Pt has walker, cane, 4-in-1 etc   Discharge Condition: Stable  CODE STATUS: Full Code  Diet recommendation: Heart Healthy      Brief/Interim Summary: Mr. Eiland is a 73 y.o. M with HTN, pAF on Eliquis, ACD, sCHF EF 40-45%, DM with neuropathy, WC bound, COPD on 3L O2, OSA who presneted with 3 days productive cough and chills.  Found to have respiratory failure from pneumonia and CHF.  Required BIPAP, pCO2 elevated, and BNP >2000.  CTA chest showed no PE but large RIGHT pleural effusion.        PRINCIPAL HOSPITAL DIAGNOSIS: Acute CHF, superimposed pneumonia, COPD exacerbation    Discharge Diagnoses:    Acute on Chronic Hypoxemic / Hypercarbic Respiratory Failure In setting of RLL pneumonia with large compressive effusion   Recurrent RLL PNA Large RIGHTPleural Effusion Admitted and started on ceftriaxone. SLP consulted, no overt aspiration.   Effusion with associated consolidation on CT. Thoracentesis showed borderline by Light's, suspect a partially diuresed transudate.    Completed 7 days Ceftriaxone.  Patient now mentating at baseline, taking orals.  Temp < 100 F, heart rate < 100bpm, RR < 24, SpO2 at baseline.   Stable for discharge.   -Follow-up pleural fluidculture,  cytology -Repeat CT imaging 4-6 weeks to ensure resolution of RLL  COPD with Acute Exacerbation Treated with Solu-medrol, improved.  Discharged on TID nebs and home Symbicort. -Recommend Pulm follow up for COPD, OSA  Acute on chronic systolic CHF Net negative 8L on admission.  Creatinine stable on IV diuresis.  Appeared euvolemic at discharge.     Paroxysmal atrial fibrillation Eliquis resumed after thoracentesis.  Suspected OSA -Plan foroutpatient PSG  Hx HTN, AF/Flutter, HLD  Hypokalemia Resolved  CKD III Cr stable relative to baseline.  Discharge Cr 1.7  Anemia ACD with superimposed critical illness, Hgb stable, no clinical bleeding  Diabeteswith Steroid Induced Hyperglycemia  Peripheral Neuropathy  BPH  Stage I Pressure Injury, POA, sacrum         Discharge Instructions  Discharge Instructions    Diet - low sodium heart healthy   Complete by: As directed    Discharge instructions   Complete by: As directed    From Dr. Loleta Books: You were admitted for trouble breathing from both fluid overload and a pneumonia (infection).  For the pneumonia, you were treated with 7 days antibiotics, and finished the course.  For the fluid overload (congestive heart failure flare) you were treated with heavy diuretics.  We got off 8 liters of fluid while you were here (not 15 liters, like we said the other day, but still, that's 16 lbs of fluid!  )  You should resume your home torsemide and metolazone and potassium as you normally take them. Weigh yourself when you get home today on your home scale --> this is your "dry  weight" when all the extra fluid has been taken off  Then weigh yourself EVERY day in your pajamas when you wake up.  If you ever gain MORE THAN 3LBs in a day or 5 lbs over your dry weight, this is fluid (not too many potato chips). If that happens, take an extra metolazone and call Dr. Claris Gladden office.   Call Dr. Marjo Bicker for an  appointment in 1-2 weeks We will message [name] to let him know you are out of the hospital, they will reach out to you.  Discuss with Dr. Marjo Bicker who you should see for a lung doctor   Increase activity slowly   Complete by: As directed      Allergies as of 08/15/2019   No Known Allergies     Medication List    TAKE these medications   albuterol 108 (90 Base) MCG/ACT inhaler Commonly known as: VENTOLIN HFA Inhale 2 puffs into the lungs every 6 (six) hours as needed for wheezing or shortness of breath.   apixaban 5 MG Tabs tablet Commonly known as: ELIQUIS Take 1 tablet (5 mg total) by mouth 2 (two) times daily.   aspirin 81 MG EC tablet Take 1 tablet (81 mg total) by mouth daily.   bisoprolol 5 MG tablet Commonly known as: ZEBETA Take 1 tablet (5 mg total) by mouth daily. What changed: when to take this   budesonide-formoterol 160-4.5 MCG/ACT inhaler Commonly known as: SYMBICORT Inhale 2 puffs into the lungs as needed (shortness of breath).   ferrous sulfate 325 (65 FE) MG tablet Take 325 mg by mouth 2 (two) times daily.   fluticasone 50 MCG/ACT nasal spray Commonly known as: FLONASE Place 2 sprays into both nostrils daily as needed for allergies.   gabapentin 300 MG capsule Commonly known as: NEURONTIN Take 1 capsule (300 mg total) by mouth 2 (two) times daily.   hydrocortisone 1 % lotion Apply 1 application topically See admin instructions. Apply small amount to back twice daily for itchy rash as needed   hydrocortisone 2.5 % rectal cream Commonly known as: ANUSOL-HC Place 1 application rectally 2 (two) times daily as needed for hemorrhoids or itching.   insulin aspart 100 UNIT/ML injection Commonly known as: novoLOG Inject 4 Units into the skin 3 (three) times daily with meals.   insulin aspart protamine - aspart (70-30) 100 UNIT/ML FlexPen Commonly known as: NovoLOG Mix 70/30 FlexPen Inject 0.1 mLs (10 Units total) into the skin 2 (two) times  daily.   ipratropium-albuterol 0.5-2.5 (3) MG/3ML Soln Commonly known as: DUONEB Take 3 mLs by nebulization every 6 (six) hours as needed (shortness of breath/wheezing). What changed: when to take this   isosorbide-hydrALAZINE 20-37.5 MG tablet Commonly known as: BIDIL Take 0.5 tablets by mouth 3 (three) times daily.   metolazone 2.5 MG tablet Commonly known as: ZAROXOLYN Take 1 tablet (2.5 mg total) by mouth 4 (four) times a week. Take extra Potassium 24mq when taking this medication   oxybutynin 5 MG tablet Commonly known as: DITROPAN Take 1 tablet (5 mg total) by mouth 4 (four) times daily.   oxyCODONE-acetaminophen 10-325 MG tablet Commonly known as: PERCOCET Take 1 tablet by mouth every 6 (six) hours as needed for pain.   OXYGEN Inhale 3 L into the lungs continuous.   pantoprazole 40 MG tablet Commonly known as: PROTONIX Take 1 tablet (40 mg total) by mouth 2 (two) times daily.   polyethylene glycol 17 g packet Commonly known as: MIRALAX / GLYCOLAX Take 17  g by mouth daily.   potassium chloride SA 20 MEQ tablet Commonly known as: KLOR-CON Take 3 tablets (60 mEq total) by mouth 2 (two) times daily. With additional 57mq when you take Metolazone.   PRESCRIPTION MEDICATION Cpap   rosuvastatin 20 MG tablet Commonly known as: CRESTOR Take 1 tablet (20 mg total) by mouth daily at 6 PM.   sodium chloride 0.65 % Soln nasal spray Commonly known as: OCEAN Place 2 sprays into both nostrils as needed for congestion.   tamsulosin 0.4 MG Caps capsule Commonly known as: FLOMAX Take 1 capsule (0.4 mg total) by mouth daily after supper.   torsemide 20 MG tablet Commonly known as: DEMADEX Take 4 tablets (80 mg total) by mouth 2 (two) times daily.      Follow-up Information    MLarey Dresser MD.   Specialty: Cardiology Contact information: [phone]. [phone]       [facility]  Why: Call to be  seen by Dr. MTarry Koswithin 1-2 weeks.   Also, call or ask to be set [place]  If you prefer, you are welcome to call for an appointment [facility] at [phone] information: [id number]         No Known Allergies  Consultations:  Critical Care  IR   Procedures/Studies: Dg Chest 1 View  Result Date: 08/13/2019 CLINICAL DATA:  Status post thoracentesis. EXAM: CHEST  1 VIEW COMPARISON:  Same day. FINDINGS: Stable cardiomegaly. No pneumothorax is noted. Stable bilateral lung opacities are noted concerning for edema or pneumonia. Grossly stable bilateral pleural effusions are noted. Bony thorax is unremarkable. IMPRESSION: Stable bilateral lung opacities and pleural effusions as described above. No pneumothorax is noted status post thoracentesis. Electronically Signed   By: JMarijo ConceptionM.D.   On: 08/13/2019 10:58   Ct Angio Chest Pe W Or Wo Contrast  Result Date: 08/10/2019 CLINICAL DATA:  Shortness of breath. EXAM: CT ANGIOGRAPHY CHEST WITH CONTRAST TECHNIQUE: Multidetector CT imaging of the chest was performed using the standard protocol during bolus administration of intravenous contrast. Multiplanar CT image reconstructions and MIPs were obtained to evaluate the vascular anatomy. CONTRAST:  665mOMNIPAQUE IOHEXOL 350 MG/ML SOLN COMPARISON:  Chest x-ray from same day. CT chest dated January 03, 2019. FINDINGS: Cardiovascular: Suboptimal opacification of the pulmonary arteries. No definite central or lobar pulmonary embolism. Unchanged dilatation of the main pulmonary artery measuring up to 4.0 cm. Unchanged cardiomegaly with prominent right heart enlargement. No pericardial effusion. No thoracic aortic aneurysm or dissection. Coronary, aortic arch, and branch vessel atherosclerotic vascular disease. Mediastinum/Nodes: Borderline and mildly enlarged mediastinal and right hilar  lymph nodes are similar to prior study and likely reactive. No enlarged axillary lymph nodes. The thyroid gland, trachea, and esophagus demonstrate no significant findings. Lungs/Pleura: Increased now large right pleural effusion with complete collapse of the right lower lobe. Partial collapse of the left lower lobe. Dependent atelectasis in both upper lobes. No consolidation or pneumothorax. Unchanged centrilobular and paraseptal emphysema. Mild interlobular septal thickening. Scattered bilateral pulmonary nodules measuring up to 8 mm are unchanged. Upper Abdomen: No acute abnormality. Musculoskeletal: No chest wall abnormality. No acute or significant osseous findings. Review of the MIP images confirms the above findings. IMPRESSION: 1. Suboptimal opacification of the pulmonary arteries. No definite central or lobar pulmonary embolism. 2. Increased now large right pleural effusion  with complete collapse of the right lower lobe. Progressive partial collapse of the left lower lobe. 3. Unchanged cardiomegaly with evidence of pulmonary arterial hypertension and mild interstitial pulmonary edema. 4. Scattered bilateral pulmonary nodules measuring up to 8 mm are unchanged since March 2020. Non-contrast chest CT at 12-18 months (from today's scan) is considered optional for low-risk patients, but is recommended for high-risk patients. This recommendation follows the consensus statement: Guidelines for Management of Incidental Pulmonary Nodules Detected on CT Images: From the Fleischner Society 2017; Radiology 2017; 284:228-243. 5.  Emphysema (ICD10-J43.9). 6.  Aortic atherosclerosis (ICD10-I70.0). Electronically Signed   By: Titus Dubin M.D.   On: 08/10/2019 00:11   Dg Chest Port 1 View  Result Date: 08/15/2019 CLINICAL DATA:  Pleural effusion. EXAM: PORTABLE CHEST 1 VIEW COMPARISON:  August 14, 2019. FINDINGS: Stable cardiomegaly with central pulmonary vascular congestion. No pneumothorax is noted. Stable left  perihilar and basilar opacity is noted concerning for atelectasis or infiltrate with associated pleural effusion. Increased right basilar atelectasis or infiltrate is noted. Bony thorax is unremarkable. IMPRESSION: Stable cardiomegaly with central pulmonary vascular congestion. Stable left basilar opacity and effusion as described above. Increased right basilar atelectasis or infiltrate is noted. Electronically Signed   By: Marijo Conception M.D.   On: 08/15/2019 07:41   Dg Chest Port 1 View  Result Date: 08/14/2019 CLINICAL DATA:  Pleural effusion Update status EXAM: PORTABLE CHEST - 1 VIEW COMPARISON:  the previous day's study FINDINGS: Stable left pleural effusion. Stable consolidation/atelectasis at the left base. Bilateral interstitial edema/infiltrates may be marginally improved. Heart size within normal limits for portable technique. Aortic Atherosclerosis (ICD10-170.0). No pneumothorax. Bilateral cervical ribs incidentally noted. 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 IMPRESSION: 1. Stable left pleural effusion and left lower lobe consolidation/atelectasis. 2. Bilateral interstitial edema/infiltrates may be marginally improved. Electronically Signed   By: Lucrezia Europe M.D.   On: 08/14/2019 08:08   Dg Chest Port 1 View  Result Date: 08/13/2019 CLINICAL DATA:  Respiratory failure. EXAM: PORTABLE CHEST 1 VIEW COMPARISON:  Radiograph of August 11, 2019. FINDINGS: Stable cardiomegaly. Stable bilateral lung opacities are noted concerning for edema or pneumonia. Bilateral pleural effusions are noted. No pneumothorax is noted. Bony thorax is unremarkable. IMPRESSION: Stable bilateral lung opacities as described above. Electronically Signed   By: Marijo Conception M.D.   On: 08/13/2019 09:44   Dg Chest Port 1 View  Result Date: 08/11/2019 CLINICAL DATA:  Respiratory failure EXAM: PORTABLE CHEST 1 VIEW COMPARISON:  08/09/2019 FINDINGS: Cardiac enlargement. Severe diffuse bilateral airspace disease without significant  change. Extensive bibasilar consolidation. Moderate right pleural effusion. IMPRESSION: Diffuse bilateral airspace disease with little change from 2 days ago. Probable congestive heart failure. Superimposed pneumonia not excluded. Electronically Signed   By: Franchot Gallo M.D.   On: 08/11/2019 08:24   Dg Chest Port 1 View  Result Date: 08/09/2019 CLINICAL DATA:  Shortness of breath EXAM: PORTABLE CHEST 1 VIEW COMPARISON:  July 18, 2019 FINDINGS: The heart size is enlarged. There is pulmonary edema. Consolidation of bilateral lung bases with bilateral pleural effusions are noted. Bony structures are stable. IMPRESSION: Congestive heart failure. Patchy consolidation of bilateral lung bases, superimposed pneumonia is not excluded. Bilateral pleural effusions. Electronically Signed   By: Abelardo Diesel M.D.   On: 08/09/2019 15:12   Dg Chest Port 1 View  Result Date: 07/18/2019 CLINICAL DATA:  Dyspnea EXAM: PORTABLE CHEST 1 VIEW COMPARISON:  Chest radiograph dated 07/07/2019 FINDINGS: The heart size remains enlarged. There are small  bilateral pleural effusions with associated atelectasis/airspace disease, increased on the right and unchanged on the left compared to 07/07/2019. There is mild pulmonary vascular congestion. No pneumothorax is identified the visualized skeletal structures are unremarkable. IMPRESSION: Increased right and unchanged left pleural effusions with associated atelectasis/airspace disease. Mild pulmonary vascular congestion. Electronically Signed   By: Zerita Boers M.D.   On: 07/18/2019 13:16   Vas [place] Burnard Bunting With/wo Tbi  Result Date: 07/19/2019 LOWER EXTREMITY DOPPLER STUDY Indications: Ulceration, and gangrene. High Risk Factors: Diabetes, current smoker.  Performing Technologist: June Leap RDMS, RVT  Examination Guidelines: A complete evaluation includes at minimum, Doppler waveform signals and systolic blood pressure reading at the level of bilateral brachial, anterior tibial,  and posterior tibial arteries, when vessel segments are accessible. Bilateral testing is considered an integral part of a complete examination. Photoelectric Plethysmograph (PPG) waveforms and toe systolic pressure readings are included as required and additional duplex testing as needed. Limited examinations for reoccurring indications may be performed as noted.  ABI Findings: +--------+------------------+-----+---------+--------+ Right   Rt Pressure (mmHg)IndexWaveform Comment  +--------+------------------+-----+---------+--------+ [id number]                    triphasic         +--------+------------------+-----+---------+--------+ ATA     138               1.08 triphasic         +--------+------------------+-----+---------+--------+ PTA     129               1.01 triphasic         +--------+------------------+-----+---------+--------+ +--------+------------------+-----+---------+-------+ Left    Lt Pressure (mmHg)IndexWaveform Comment +--------+------------------+-----+---------+-------+ [id number]                    triphasic        +--------+------------------+-----+---------+-------+ ATA     164               1.28 triphasic        +--------+------------------+-----+---------+-------+ PTA     144               1.12 triphasic        +--------+------------------+-----+---------+-------+  Summary: Right: Resting right ankle-brachial index is within normal range. No evidence of significant right lower extremity arterial disease. Left: Resting left ankle-brachial index is within normal range. No evidence of significant left lower extremity arterial disease.  *See table(s) above for measurements and observations.  Electronically signed by Deitra Mayo MD on 07/19/2019 at 2:56:20 PM.   Final    US Thoracentesis Asp Pleural Space W/img Guide  Result Date: 08/14/2019 INDICATION: Patient with history of chronic respiratory failure, recurrent pneumonia, right  pleural effusion. Request is made for diagnostic and therapeutic thoracentesis. EXAM: ULTRASOUND GUIDED DIAGNOSTIC AND THERAPEUTIC RIGHT THORACENTESIS MEDICATIONS: 10 mL 1% lidocaine COMPLICATIONS: None immediate. PROCEDURE: An ultrasound guided thoracentesis was thoroughly discussed with the patient and questions answered. The benefits, risks, alternatives and complications were also discussed. The patient understands and wishes to proceed with the procedure. Written consent was obtained. Ultrasound was performed to localize and mark an adequate pocket of fluid in the right chest. The area was then prepped and draped in the normal sterile fashion. 1% Lidocaine was used for local anesthesia. Under ultrasound guidance a 6 Fr Safe-T-Centesis catheter was introduced. Thoracentesis was performed. The catheter was removed and a dressing applied. FINDINGS: A total of approximately 1.0 liters of cloudy, pink fluid was removed. Samples were sent to the  laboratory as requested by the clinical team. IMPRESSION: Successful ultrasound guided diagnostic and therapeutic right thoracentesis yielding 1.0 liters of pleural fluid. Read by: Brynda Greathouse PA-C No pneumothorax on follow-up radiograph. Electronically Signed   By: Lucrezia Europe M.D.   On: 08/14/2019 11:11      Subjective: Pt reports feeling better.  States he ambulated past the nursing station into the hall and back yesterday.    Discharge Exam: Vitals:   08/15/19 0732 08/15/19 0811  BP:    Pulse:    Resp:    Temp: 98.7 F (37.1 C)   SpO2:  94%   Vitals:   08/15/19 0344 08/15/19 0345 08/15/19 0732 08/15/19 0811  BP:  (!) 112/52    Pulse:      Resp: (!) 29 (!) 24    Temp:  98.7 F (37.1 C) 98.7 F (37.1 C)   TempSrc:  Axillary Oral   SpO2:  98%  94%  Weight:    116.4 kg    General: Pt is alert, awake, not in acute distress, up in chair  Cardiovascular: RRR, nl S1-S2, no murmurs appreciated.  Trace LE edema, BLE wrapped in compression stocking.    Respiratory: Normal respiratory rate and rhythm.  On [place] 3.5L O2 (baseline). Lungs clear anterior, diminished bases Abdominal: Abdomen soft and non-tender.  No distension or HSM.   Neuro/Psych: Strength symmetric in upper and lower extremities.  Judgment and insight appear normal.   The results of significant diagnostics from this hospitalization (including imaging, microbiology, ancillary and laboratory) are listed below for reference.     Microbiology: Recent Results (from the past 240 hour(s))  Novel Coronavirus, NAA (Hosp order, Send-out to Ref Lab; TAT 18-24 hrs     Status: None   Collection Time: 08/05/19  2:29 PM   Specimen: Nasopharyngeal Swab; Respiratory  Result Value Ref Range Status   SARS-CoV-2, NAA NOT DETECTED NOT DETECTED Final    Comment: (NOTE) This nucleic acid amplification test was developed and its performance characteristics determined by [employer]. Nucleic acid amplification tests include PCR and TMA. This test has not been FDA cleared or approved. This test has been authorized by FDA under an Emergency Use Authorization (EUA). This test is only authorized for the duration of time the declaration that circumstances exist justifying the authorization of the emergency use of in vitro diagnostic tests for detection of SARS-CoV-2 virus and/or diagnosis of COVID-19 infection under section 564(b)(1) of the Act, 21 U.S.C. 106YIR-4(W) (1), unless the authorization is terminated or revoked sooner. When diagnostic testing is negative, the possibility of a false negative result should be considered in the context of a patient's recent exposures and the presence of clinical signs and symptoms consistent with COVID-19. An individual without symptoms of COVID- 19 and who is not shedding SARS-CoV-2 vi rus would expect to have a negative (not detected) result in this assay. Performed At: [facility] [address] [place], [place] [id number] Rush Farmer MD KX:[id number]    [place]  Final    Comment: Performed at [facility], [place] [address] [place], [place] [address]  Blood Culture (routine x 2)     Status: None   Collection Time: 08/09/19  3:10 PM   Specimen: BLOOD RIGHT HAND  Result Value Ref Range Status   Specimen Description BLOOD RIGHT HAND  Final   Special Requests   Final    BOTTLES DRAWN AEROBIC AND ANAEROBIC Blood Culture adequate volume   Culture  Final    NO GROWTH 5 DAYS Performed at [facility], [place] [address] [place], [place] [address]    Report Status 08/14/2019 FINAL  Final  SARS Coronavirus 2 [facility] order, Performed in [facility] hospital lab) Nasopharyngeal Nasopharyngeal Swab     Status: None   Collection Time: 08/09/19  3:27 PM   Specimen: Nasopharyngeal Swab  Result Value Ref Range Status   SARS Coronavirus 2 NEGATIVE NEGATIVE Final    Comment: (NOTE) If result is NEGATIVE SARS-CoV-2 target nucleic acids are NOT DETECTED. The SARS-CoV-2 RNA is generally detectable in upper and lower  respiratory specimens during the acute phase of infection. The lowest  concentration of SARS-CoV-2 viral copies this assay can detect is 250  copies / mL. A negative result does not preclude SARS-CoV-2 infection  and should not be used as the sole basis for treatment or other  patient management decisions.  A negative result may occur with  improper specimen collection / handling, submission of specimen other  than nasopharyngeal swab, presence of viral mutation(s) within the  areas targeted by this assay, and inadequate number of viral copies  (<250 copies / mL). A negative result must be combined with clinical  observations, patient history, and epidemiological information. If result is POSITIVE SARS-CoV-2 target nucleic acids are DETECTED. The SARS-CoV-2 RNA is generally detectable in upper and lower  respiratory specimens dur ing the acute phase of infection.  Positive   results are indicative of active infection with SARS-CoV-2.  Clinical  correlation with patient history and other diagnostic information is  necessary to determine patient infection status.  Positive results do  not rule out bacterial infection or co-infection with other viruses. If result is PRESUMPTIVE POSTIVE SARS-CoV-2 nucleic acids MAY BE PRESENT.   A presumptive positive result was obtained on the submitted specimen  and confirmed on repeat testing.  While 2019 novel coronavirus  (SARS-CoV-2) nucleic acids may be present in the submitted sample  additional confirmatory testing may be necessary for epidemiological  and / or clinical management purposes  to differentiate between  SARS-CoV-2 and other Sarbecovirus currently known to infect humans.  If clinically indicated additional testing with an alternate test  methodology [phone]) is advised. The SARS-CoV-2 RNA is generally  detectable in upper and lower respiratory sp ecimens during the acute  phase of infection. The expected result is Negative. Fact Sheet for Patients:  StrictlyIdeas.no Fact Sheet for Healthcare Providers: BankingDealers.co.za This test is not yet approved or cleared by the [place] FDA and has been authorized for detection and/or diagnosis of SARS-CoV-2 by FDA under an Emergency Use Authorization (EUA).  This EUA will remain in effect (meaning this test can be used) for the duration of the COVID-19 declaration under Section 564(b)(1) of the Act, 21 U.S.C. section 360bbb-3(b)(1), unless the authorization is terminated or revoked sooner. Performed at [facility], [place] [address], [place], [place] [address]   Blood Culture (routine x 2)     Status: None   Collection Time: 08/09/19  3:30 PM   Specimen: BLOOD RIGHT HAND  Result Value Ref Range Status   Specimen Description BLOOD RIGHT HAND  Final   Special Requests   Final    BOTTLES DRAWN AEROBIC  ONLY Blood Culture results may not be optimal due to an inadequate volume of blood received in culture bottles   Culture   Final    NO GROWTH 5 DAYS Performed at [facility], [place] [address],  [place], [place] [address]    Report Status 08/14/2019 FINAL  Final  Culture, blood (routine x 2) Call MD if unable to obtain prior to antibiotics being given     Status: None   Collection Time: 08/09/19  7:50 PM   Specimen: BLOOD  Result Value Ref Range Status   Specimen Description BLOOD RIGHT ANTECUBITAL  Final   Special Requests   Final    BOTTLES DRAWN AEROBIC AND ANAEROBIC Blood Culture results may not be optimal due to an inadequate volume of blood received in culture bottles   Culture   Final    NO GROWTH 5 DAYS Performed at [facility], [place] [address], [place], [place] [address]    Report Status 08/14/2019 FINAL  Final  Culture, blood (routine x 2) Call MD if unable to obtain prior to antibiotics being given     Status: None   Collection Time: 08/09/19  8:10 PM   Specimen: BLOOD RIGHT FOREARM  Result Value Ref Range Status   Specimen Description BLOOD RIGHT FOREARM  Final   Special Requests   Final    BOTTLES DRAWN AEROBIC AND ANAEROBIC Blood Culture adequate volume   Culture   Final    NO GROWTH 5 DAYS Performed at [facility], [place] [address], [place], [place] [address]    Report Status 08/14/2019 FINAL  Final  MRSA PCR Screening     Status: None   Collection Time: 08/10/19  9:10 PM   Specimen: Nasal Mucosa; Nasopharyngeal  Result Value Ref Range Status   MRSA by PCR NEGATIVE NEGATIVE Final    Comment:        The GeneXpert MRSA Assay (FDA approved for NASAL specimens only), is one component of a comprehensive MRSA colonization surveillance program. It is not intended to diagnose MRSA infection nor to guide or monitor treatment for MRSA infections. Performed at [facility], [place] [address], [place], [place] [address]   Acid Fast Smear (AFB)      Status: None   Collection Time: 08/13/19 10:24 AM   Specimen: PATH Cytology Pleural fluid  Result Value Ref Range Status   AFB Specimen Processing Concentration  Final   Acid Fast Smear Negative  Final    Comment: (NOTE) Performed At: [facility] [place], [place] [id number] Rush Farmer MD TW:[id number]    Source (AFB) PLEURAL  Final    Comment: RIGHT Performed at [facility], [place] [address], [place], [place] [address]   Culture, body fluid-bottle     Status: None (Preliminary result)   Collection Time: 08/13/19 10:24 AM   Specimen: Pleura  Result Value Ref Range Status   Specimen Description PLEURAL RIGHT  Final   Special Requests NONE  Final   Culture   Final    NO GROWTH 2 DAYS Performed at [place] [address] [place], [place] [address]    Report Status PENDING  Incomplete  Gram stain     Status: None   Collection Time: 08/13/19 10:24 AM   Specimen: Pleura  Result Value Ref Range Status   Specimen Description PLEURAL RIGHT  Final   Special Requests NONE  Final   Gram Stain   Final    FEW WBC PRESENT, PREDOMINANTLY MONONUCLEAR NO ORGANISMS SEEN Performed at [facility], [address] [place], [place] [address]    Report Status 08/13/2019 FINAL  Final     Labs: BNP (last 3 results) Recent  Labs    08/09/19 1441 08/09/19 1830 08/12/19 1017  BNP 2,698.3* 2,778.5* 3,254.9*   Basic Metabolic Panel: Recent Labs  Lab 08/09/19 1600  08/11/19 0500 08/12/19 0304 08/13/19 0257 08/14/19 0539 08/15/19 0554  NA  --    < > 138 137 139 139 141  K  --    < > 3.2* 3.2* 3.3* 3.3* 3.3*  CL  --    < > 83* 80* 81* 84* 88*  CO2  --    < > 40* 42* 45* 42* 39*  GLUCOSE  --    < > 351* 285* 241* 167* 127*  BUN  --    < > 80* 77* 68* 59* 54*  CREATININE  --    < > 2.16* 1.92* 1.82* 1.59* 1.70*  CALCIUM  --    < > 8.5* 8.7* 8.7* 8.4* 8.3*  MG 2.4  --   --  2.6* 2.6*  --   --    < > = values in this interval not  displayed.   Liver Function Tests: Recent Labs  Lab 08/09/19 1441 08/10/19 0443 08/13/19 0257  AST 20 16  --   ALT 16 16  --   ALKPHOS 130* 112  --   BILITOT 1.2 1.5*  --   PROT 6.7 6.2* 6.0*  ALBUMIN 3.5 3.1*  --    No results for input(s): LIPASE, AMYLASE in the last 168 hours. No results for input(s): AMMONIA in the last 168 hours. CBC: Recent Labs  Lab 08/09/19 1441  08/10/19 0443 08/11/19 0500 08/12/19 0304 08/13/19 0257 08/14/19 0539  WBC 6.1   < > 2.6* 3.1* 5.0 6.2 6.7  NEUTROABS 4.9  --  2.2  --   --   --   --   HGB 10.6*   < > 9.4* 8.9* 8.7* 8.6* 8.7*  HCT 34.4*   < > 32.7* 29.3* 27.7* 29.4* 29.8*  MCV 87.3   < > 89.3 86.9 85.2 89.1 88.2  PLT 213   < > 220 227 206 211 221   < > = values in this interval not displayed.   Cardiac Enzymes: No results for input(s): CKTOTAL, CKMB, CKMBINDEX, TROPONINI in the last 168 hours. BNP: Invalid input(s): POCBNP CBG: Recent Labs  Lab 08/14/19 1200 08/14/19 1559 08/14/19 1941 08/14/19 2343 08/15/19 0733  GLUCAP 238* 169* 188* 123* 149*   D-Dimer No results for input(s): DDIMER in the last 72 hours. Hgb A1c No results for input(s): HGBA1C in the last 72 hours. Lipid Profile No results for input(s): CHOL, HDL, LDLCALC, TRIG, CHOLHDL, LDLDIRECT in the last 72 hours. Thyroid function studies No results for input(s): TSH, T4TOTAL, T3FREE, THYROIDAB in the last 72 hours.  Invalid input(s): FREET3 Anemia work up No results for input(s): VITAMINB12, FOLATE, FERRITIN, TIBC, IRON, RETICCTPCT in the last 72 hours. Urinalysis    Component Value Date/Time   COLORURINE STRAW (A) 07/08/2019 0008   APPEARANCEUR CLEAR 07/08/2019 0008   LABSPEC 1.006 07/08/2019 0008   PHURINE 6.0 07/08/2019 0008   GLUCOSEU NEGATIVE 07/08/2019 0008   HGBUR NEGATIVE 07/08/2019 0008   BILIRUBINUR NEGATIVE 07/08/2019 0008   KETONESUR NEGATIVE 07/08/2019 0008   PROTEINUR NEGATIVE 07/08/2019 0008   UROBILINOGEN 0.2 10/26/2014 2206   NITRITE  NEGATIVE 07/08/2019 0008   LEUKOCYTESUR NEGATIVE 07/08/2019 0008   Sepsis Labs Invalid input(s): PROCALCITONIN,  WBC,  LACTICIDVEN Microbiology Recent Results (from the past 240 hour(s))  Novel Coronavirus, NAA (Hosp order, Send-out to Ref Lab; TAT 18-24 hrs  Status: None   Collection Time: 08/05/19  2:29 PM   Specimen: Nasopharyngeal Swab; Respiratory  Result Value Ref Range Status   SARS-CoV-2, NAA NOT DETECTED NOT DETECTED Final    Comment: (NOTE) This nucleic acid amplification test was developed and its performance characteristics determined by [employer]. Nucleic acid amplification tests include PCR and TMA. This test has not been FDA cleared or approved. This test has been authorized by FDA under an Emergency Use Authorization (EUA). This test is only authorized for the duration of time the declaration that circumstances exist justifying the authorization of the emergency use of in vitro diagnostic tests for detection of SARS-CoV-2 virus and/or diagnosis of COVID-19 infection under section 564(b)(1) of the Act, 21 U.S.C. 295AOZ-3(Y) (1), unless the authorization is terminated or revoked sooner. When diagnostic testing is negative, the possibility of a false negative result should be considered in the context of a patient's recent exposures and the presence of clinical signs and symptoms consistent with COVID-19. An individual without symptoms of COVID- 19 and who is not shedding SARS-CoV-2 vi rus would expect to have a negative (not detected) result in this assay. Performed At: [facility] [address] [place], [place] [id number] Rush Farmer MD EX:[id number]    Ruth  Final    Comment: Performed at [facility], [place] [address], [place], [place] [address]  Blood Culture (routine x 2)     Status: None   Collection Time: 08/09/19  3:10 PM   Specimen: BLOOD RIGHT HAND  Result Value Ref Range Status   Specimen  Description BLOOD RIGHT HAND  Final   Special Requests   Final    BOTTLES DRAWN AEROBIC AND ANAEROBIC Blood Culture adequate volume   Culture   Final    NO GROWTH 5 DAYS Performed at [facility], [place] [address] [place], [place] [address]    Report Status 08/14/2019 FINAL  Final  SARS Coronavirus 2 [facility] order, Performed in [facility] hospital lab) Nasopharyngeal Nasopharyngeal Swab     Status: None   Collection Time: 08/09/19  3:27 PM   Specimen: Nasopharyngeal Swab  Result Value Ref Range Status   SARS Coronavirus 2 NEGATIVE NEGATIVE Final    Comment: (NOTE) If result is NEGATIVE SARS-CoV-2 target nucleic acids are NOT DETECTED. The SARS-CoV-2 RNA is generally detectable in upper and lower  respiratory specimens during the acute phase of infection. The lowest  concentration of SARS-CoV-2 viral copies this assay can detect is 250  copies / mL. A negative result does not preclude SARS-CoV-2 infection  and should not be used as the sole basis for treatment or other  patient management decisions.  A negative result may occur with  improper specimen collection / handling, submission of specimen other  than nasopharyngeal swab, presence of viral mutation(s) within the  areas targeted by this assay, and inadequate number of viral copies  (<250 copies / mL). A negative result must be combined with clinical  observations, patient history, and epidemiological information. If result is POSITIVE SARS-CoV-2 target nucleic acids are DETECTED. The SARS-CoV-2 RNA is generally detectable in upper and lower  respiratory specimens dur ing the acute phase of infection.  Positive  results are indicative of active infection with SARS-CoV-2.  Clinical  correlation with patient history and other diagnostic information is  necessary to determine patient infection status.  Positive results do  not rule out bacterial infection or co-infection with other viruses. If result is PRESUMPTIVE  POSTIVE  SARS-CoV-2 nucleic acids MAY BE PRESENT.   A presumptive positive result was obtained on the submitted specimen  and confirmed on repeat testing.  While 2019 novel coronavirus  (SARS-CoV-2) nucleic acids may be present in the submitted sample  additional confirmatory testing may be necessary for epidemiological  and / or clinical management purposes  to differentiate between  SARS-CoV-2 and other Sarbecovirus currently known to infect humans.  If clinically indicated additional testing with an alternate test  methodology [id number]) is advised. The SARS-CoV-2 RNA is generally  detectable in upper and lower respiratory sp ecimens during the acute  phase of infection. The expected result is Negative. Fact Sheet for Patients:  StrictlyIdeas.no Fact Sheet for Healthcare Providers: BankingDealers.co.za This test is not yet approved or cleared by the [place] FDA and has been authorized for detection and/or diagnosis of SARS-CoV-2 by FDA under an Emergency Use Authorization (EUA).  This EUA will remain in effect (meaning this test can be used) for the duration of the COVID-19 declaration under Section 564(b)(1) of the Act, 21 U.S.C. section 360bbb-3(b)(1), unless the authorization is terminated or revoked sooner. Performed at [facility], [place] [address] [place], [place] [address]   Blood Culture (routine x 2)     Status: None   Collection Time: 08/09/19  3:30 PM   Specimen: BLOOD RIGHT HAND  Result Value Ref Range Status   Specimen Description BLOOD RIGHT HAND  Final   Special Requests   Final    BOTTLES DRAWN AEROBIC ONLY Blood Culture results may not be optimal due to an inadequate volume of blood received in culture bottles   Culture   Final    NO GROWTH 5 DAYS Performed at [facility], [place] [address] [place] [address], [place], [place] [address]    Report Status 08/14/2019 FINAL  Final  Culture, blood (routine x 2) Call MD if  unable to obtain prior to antibiotics being given     Status: None   Collection Time: 08/09/19  7:50 PM   Specimen: BLOOD  Result Value Ref Range Status   Specimen Description BLOOD RIGHT ANTECUBITAL  Final   Special Requests   Final    BOTTLES DRAWN AEROBIC AND ANAEROBIC Blood Culture results may not be optimal due to an inadequate volume of blood received in culture bottles   Culture   Final    NO GROWTH 5 DAYS Performed at [facility], [place] [address], [place], [place] [address]    Report Status 08/14/2019 FINAL  Final  Culture, blood (routine x 2) Call MD if unable to obtain prior to antibiotics being given     Status: None   Collection Time: 08/09/19  8:10 PM   Specimen: BLOOD RIGHT FOREARM  Result Value Ref Range Status   Specimen Description BLOOD RIGHT FOREARM  Final   Special Requests   Final    BOTTLES DRAWN AEROBIC AND ANAEROBIC Blood Culture adequate volume   Culture   Final    NO GROWTH 5 DAYS Performed at [facility], [place] [address] [place],  [place] [address]    Report Status 08/14/2019 FINAL  Final  MRSA PCR Screening     Status: None   Collection Time: 08/10/19  9:10 PM   Specimen: Nasal Mucosa; Nasopharyngeal  Result Value Ref Range Status   MRSA by PCR NEGATIVE NEGATIVE Final    Comment:        The GeneXpert MRSA Assay (FDA approved for NASAL specimens only), is one  component of a comprehensive MRSA colonization surveillance program. It is not intended to diagnose MRSA infection nor to guide or monitor treatment for MRSA infections. Performed at [facility], [place] [address] [place], [place] [address]   Acid Fast Smear (AFB)     Status: None   Collection Time: 08/13/19 10:24 AM   Specimen: PATH Cytology Pleural fluid  Result Value Ref Range Status   AFB Specimen Processing Concentration  Final   Acid Fast Smear Negative  Final    Comment: (NOTE) Performed At: [facility] [place], [place] [id number] Rush Farmer MD PO:[id number]    Source (AFB) PLEURAL  Final    Comment: RIGHT Performed at [facility], [place] [address] [place], [place] [address]   Culture, body fluid-bottle     Status: None (Preliminary result)   Collection Time: 08/13/19 10:24 AM   Specimen: Pleura  Result Value Ref Range Status   Specimen Description PLEURAL RIGHT  Final   Special Requests NONE  Final   Culture   Final    NO GROWTH 2 DAYS Performed at [place] [address], [place], [place] [address]    Report Status PENDING  Incomplete  Gram stain     Status: None   Collection Time: 08/13/19 10:24 AM   Specimen: Pleura  Result Value Ref Range Status   Specimen Description PLEURAL RIGHT  Final   Special Requests NONE  Final   Gram Stain   Final    FEW WBC PRESENT, PREDOMINANTLY MONONUCLEAR NO ORGANISMS SEEN Performed at [facility], [address], [place], [place] [address]    Report Status 08/13/2019 FINAL  Final     Time coordinating discharge: 25 minutes    SIGNED:  Myrene Buddy, MD   [facility] 08/15/2019, 1:36 PM

## 2019-08-14 NOTE — Progress Notes (Signed)
PROGRESS NOTE    Javi Bollman  CBJ:[id number] DOB: 17-Jul-1946 DOA: 08/09/2019 PCP: [facility]    Brief Narrative:  Mr. Joiner is a 73 y.o. M with HTN, pAF on Eliquis, ACD, sCHF EF 40-45%, DM with neuropathy, WC bound, COPD on 3L O2, OSA who presneted with 3 days productive cough and chills.  In the ER, he had hypoxia requiring BiPAP as well as hypercarbia and BNP >2000.  CTA chest showed no PE but large RIGHT pleural effusion. Improved with diuresis. Underwent thoracentesis on 10/10 with transudative fluid, pathology pending.     Assessment & Plan:  Acute on Chronic Hypoxemic / Hypercarbic Respiratory Failure In setting of RLL pneumonia with large compressive effusion  -wean O2 for sats > 88%, 4L baseline  -pulmonary hygiene - IS, mobilize   Recurrent RLL PNA with Pleural Effusion  Status post thoracentesis on 10/10 with pleural protein ratio 0.6, may reflect pleural fluid changes with chronic diuresis.  SLP evaluation negative for aspiration.  -follow pleural studies -D6/7 rocephin -follow up CXR in am 10/12  -will need outpatient pulmonary follow up with repeat CT imaging in 4-6 weeks to ensure clearance of RLL  COPD with Acute Exacerbation  -continue duoneb TID + PRN albuterol while inpatient  -Pulmicort + Brovana neb BID -resume home symbicort at discharge    Acute on chronic systolic CHF Stable.  I/O- 1.5L UOP in 24/h, net neg 1.1L 24/h, -5.4L for admit -discontinue lasix, resume home torsemide 80 mg BID  -continue bisoprolol, bidil  -hold home zaroxolyn   Paroxysmal atrial fibrillation Rate controlled, NSR 80's -bisoprolol as above -resume home eliquis   Suspected OSA PSG in 11/2018 at the [facility] on home CPAP -continue BiPAP while inpatient QHS  -transition back to home CPAP at discharge  -follow up at [place] in [place] or with [facility]   Hx HTN, AF/Flutter, HLD -continue LE compression wraps  -crestor, ASA, zebeta, bidil  -resume  eliquis as above   Hypokalemia -KCL 30 mEq BID -additional 20 mEq x1 10/11  CKD III Creatinine baseline 1.6-2 -follow BMP / UOP  -avoid nephrotoxic agents, renal dose medications   Anemia  ACD with superimposed critical illness -trend CBC -transfuse for Hgb <7%  Diabetes with Steroid Induced Hyperglycemia  Glucose range 100-350 10/11 -follow glucose with increase in lantus  -SSI  -continue lantus 20 units QD  Peripheral Neuropathy  -PRN percocet   BPH -flomax   Stage I Pressure Injury Right buttock, present on admit  -mobilize -wound care per nursing       DVT prophylaxis: Eliquis   Code Status: Full Code  Family Communication: Patient updated on plan of care. Anticipate discharge 10/12 am pending lab review post diuresis.     Consultants:   IR   Procedures:   SLP evaluation 10/8 >> regular diet, thin liquids  Thoracentesis 10/10 >> exudative by LDH >>   Antimicrobials:   Ceftriaxone 10/6 >>    Subjective: Pt reports feeling better overall. Hopeful to go home tomorrow. O2 at baseline. No acute complaints.   Objective: Vitals:   08/14/19 0034 08/14/19 0302 08/14/19 0400 08/14/19 0409  BP:  111/62    Pulse: 69 70    Resp: (!) 22 (!) 24    Temp:  99 F (37.2 C)    TempSrc:  Axillary    SpO2: 100% 97% 98% 97%  Weight:        Intake/Output Summary (Last 24 hours) at 08/14/2019 1607 Last data filed at  08/14/2019 0500 Gross per 24 hour  Intake 460 ml  Output 1585 ml  Net -1125 ml   Filed Weights   08/10/19 2000  Weight: 115 kg    Examination:  General appearance: chronically ill appearing adult male lying in bed in NAD HEENT: MM pink/moist, difficult to assess JVD due to body habitus, [place] O2,  Skin: warm/dry, no rashes or lesions   Cardiac: s1s2 RRR, SR 80's on monitor, no m/r/g, cap refill <3 sec, LE's wrapped in compression dressings, radial pulses = bilaterally Respiratory: even/non-labored, lungs bilaterally with occasional wheeze    Abdomen: protuberant/soft, bsx4 active, non-tender to palpation, MSK: no acute deformities or effusions . Neuro: AAOx4, speech clear, MAE Psych: appropriate / calm, insight appears normal    Data Reviewed: I have personally reviewed following labs and imaging studies:  CBC: Recent Labs  Lab 08/09/19 1441  08/10/19 0443 08/11/19 0500 08/12/19 0304 08/13/19 0257 08/14/19 0539  WBC 6.1   < > 2.6* 3.1* 5.0 6.2 6.7  NEUTROABS 4.9  --  2.2  --   --   --   --   HGB 10.6*   < > 9.4* 8.9* 8.7* 8.6* 8.7*  HCT 34.4*   < > 32.7* 29.3* 27.7* 29.4* 29.8*  MCV 87.3   < > 89.3 86.9 85.2 89.1 88.2  PLT 213   < > 220 227 206 211 221   < > = values in this interval not displayed.   Basic Metabolic Panel: Recent Labs  Lab 08/09/19 1600  08/10/19 0443 08/11/19 0500 08/12/19 0304 08/13/19 0257 08/14/19 0539  NA  --    < > 139 138 137 139 139  K  --    < > 3.2* 3.2* 3.2* 3.3* 3.3*  CL  --   --  80* 83* 80* 81* 84*  CO2  --   --  37* 40* 42* 45* 42*  GLUCOSE  --   --  137* 351* 285* 241* 167*  BUN  --   --  77* 80* 77* 68* 59*  CREATININE  --   --  2.19* 2.16* 1.92* 1.82* 1.59*  CALCIUM  --   --  8.8* 8.5* 8.7* 8.7* 8.4*  MG 2.4  --   --   --  2.6* 2.6*  --    < > = values in this interval not displayed.   GFR: Estimated Creatinine Clearance: 55.8 mL/min (A) (by C-G formula based on SCr of 1.59 mg/dL (H)). Liver Function Tests: Recent Labs  Lab 08/09/19 1441 08/10/19 0443 08/13/19 0257  AST 20 16  --   ALT 16 16  --   ALKPHOS 130* 112  --   BILITOT 1.2 1.5*  --   PROT 6.7 6.2* 6.0*  ALBUMIN 3.5 3.1*  --    No results for input(s): LIPASE, AMYLASE in the last 168 hours. No results for input(s): AMMONIA in the last 168 hours. Coagulation Profile: No results for input(s): INR, PROTIME in the last 168 hours. Cardiac Enzymes: No results for input(s): CKTOTAL, CKMB, CKMBINDEX, TROPONINI in the last 168 hours. BNP (last 3 results) No results for input(s): PROBNP in the last 8760  hours. HbA1C: No results for input(s): HGBA1C in the last 72 hours. CBG: Recent Labs  Lab 08/13/19 0823 08/13/19 1216 08/13/19 1630 08/13/19 2011 08/13/19 2150  GLUCAP 205* 201* 121* 198* 186*   Lipid Profile: No results for input(s): CHOL, HDL, LDLCALC, TRIG, CHOLHDL, LDLDIRECT in the last 72 hours. Thyroid Function Tests: No  results for input(s): TSH, T4TOTAL, FREET4, T3FREE, THYROIDAB in the last 72 hours. Anemia Panel: No results for input(s): VITAMINB12, FOLATE, FERRITIN, TIBC, IRON, RETICCTPCT in the last 72 hours. Urine analysis:    Component Value Date/Time   COLORURINE STRAW (A) 07/08/2019 0008   APPEARANCEUR CLEAR 07/08/2019 0008   LABSPEC 1.006 07/08/2019 0008   PHURINE 6.0 07/08/2019 0008   GLUCOSEU NEGATIVE 07/08/2019 0008   HGBUR NEGATIVE 07/08/2019 0008   BILIRUBINUR NEGATIVE 07/08/2019 0008   KETONESUR NEGATIVE 07/08/2019 0008   PROTEINUR NEGATIVE 07/08/2019 0008   UROBILINOGEN 0.2 10/26/2014 2206   NITRITE NEGATIVE 07/08/2019 0008   LEUKOCYTESUR NEGATIVE 07/08/2019 0008   Sepsis Labs: _0 (procalcitonin:4,lacticacidven:4)  ) Recent Results (from the past 240 hour(s))  Novel Coronavirus, NAA (Hosp order, Send-out to Ref Lab; TAT 18-24 hrs     Status: None   Collection Time: 08/05/19  2:29 PM   Specimen: Nasopharyngeal Swab; Respiratory  Result Value Ref Range Status   SARS-CoV-2, NAA NOT DETECTED NOT DETECTED Final    Comment: (NOTE) This nucleic acid amplification test was developed and its performance characteristics determined by [employer]. Nucleic acid amplification tests include PCR and TMA. This test has not been FDA cleared or approved. This test has been authorized by FDA under an Emergency Use Authorization (EUA). This test is only authorized for the duration of time the declaration that circumstances exist justifying the authorization of the emergency use of in vitro diagnostic tests for detection of SARS-CoV-2 virus  and/or diagnosis of COVID-19 infection under section 564(b)(1) of the Act, 21 U.S.C. 440NUU-7(O) (1), unless the authorization is terminated or revoked sooner. When diagnostic testing is negative, the possibility of a false negative result should be considered in the context of a patient's recent exposures and the presence of clinical signs and symptoms consistent with COVID-19. An individual without symptoms of COVID- 19 and who is not shedding SARS-CoV-2 vi rus would expect to have a negative (not detected) result in this assay. Performed At: [facility] [address] [place], [place] [id number] Rush Farmer MD VQ:[id number]    [place]  Final    Comment: Performed at [facility], [place] [address], [place], [place] [address]  Blood Culture (routine x 2)     Status: None (Preliminary result)   Collection Time: 08/09/19  3:10 PM   Specimen: BLOOD RIGHT HAND  Result Value Ref Range Status   Specimen Description BLOOD RIGHT HAND  Final   Special Requests   Final    BOTTLES DRAWN AEROBIC AND ANAEROBIC Blood Culture adequate volume   Culture   Final    NO GROWTH 4 DAYS Performed at [facility], [place] [address] [place], [place] [address]    Report Status PENDING  Incomplete  SARS Coronavirus 2 [facility] order, Performed in [facility] hospital lab) Nasopharyngeal Nasopharyngeal Swab     Status: None   Collection Time: 08/09/19  3:27 PM   Specimen: Nasopharyngeal Swab  Result Value Ref Range Status   SARS Coronavirus 2 NEGATIVE NEGATIVE Final    Comment: (NOTE) If result is NEGATIVE SARS-CoV-2 target nucleic acids are NOT DETECTED. The SARS-CoV-2 RNA is generally detectable in upper and lower  respiratory specimens during the acute phase of infection. The lowest  concentration of SARS-CoV-2 viral copies this assay can detect is 250  copies / mL. A negative result does not preclude SARS-CoV-2 infection  and should not be used as the  sole basis for treatment or other  patient management decisions.  A negative result may occur with  improper specimen collection / handling, submission of specimen other  than nasopharyngeal swab, presence of viral mutation(s) within the  areas targeted by this assay, and inadequate number of viral copies  (<250 copies / mL). A negative result must be combined with clinical  observations, patient history, and epidemiological information. If result is POSITIVE SARS-CoV-2 target nucleic acids are DETECTED. The SARS-CoV-2 RNA is generally detectable in upper and lower  respiratory specimens dur ing the acute phase of infection.  Positive  results are indicative of active infection with SARS-CoV-2.  Clinical  correlation with patient history and other diagnostic information is  necessary to determine patient infection status.  Positive results do  not rule out bacterial infection or co-infection with other viruses. If result is PRESUMPTIVE POSTIVE SARS-CoV-2 nucleic acids MAY BE PRESENT.   A presumptive positive result was obtained on the submitted specimen  and confirmed on repeat testing.  While 2019 novel coronavirus  (SARS-CoV-2) nucleic acids may be present in the submitted sample  additional confirmatory testing may be necessary for epidemiological  and / or clinical management purposes  to differentiate between  SARS-CoV-2 and other Sarbecovirus currently known to infect humans.  If clinically indicated additional testing with an alternate test  methodology [phone]) is advised. The SARS-CoV-2 RNA is generally  detectable in upper and lower respiratory sp ecimens during the acute  phase of infection. The expected result is Negative. Fact Sheet for Patients:  StrictlyIdeas.no Fact Sheet for Healthcare Providers: BankingDealers.co.za This test is not yet approved or cleared by the [place] FDA and has been authorized for detection  and/or diagnosis of SARS-CoV-2 by FDA under an Emergency Use Authorization (EUA).  This EUA will remain in effect (meaning this test can be used) for the duration of the COVID-19 declaration under Section 564(b)(1) of the Act, 21 U.S.C. section 360bbb-3(b)(1), unless the authorization is terminated or revoked sooner. Performed at [facility], [place] [address] [place], [place] [address]   Blood Culture (routine x 2)     Status: None (Preliminary result)   Collection Time: 08/09/19  3:30 PM   Specimen: BLOOD RIGHT HAND  Result Value Ref Range Status   Specimen Description BLOOD RIGHT HAND  Final   Special Requests   Final    BOTTLES DRAWN AEROBIC ONLY Blood Culture results may not be optimal due to an inadequate volume of blood received in culture bottles   Culture   Final    NO GROWTH 4 DAYS Performed at [facility], [place] [address] [place], [place] [address]    Report Status PENDING  Incomplete  Culture, blood (routine x 2) Call MD if unable to obtain prior to antibiotics being given     Status: None (Preliminary result)   Collection Time: 08/09/19  7:50 PM   Specimen: BLOOD  Result Value Ref Range Status   Specimen Description BLOOD RIGHT ANTECUBITAL  Final   Special Requests   Final    BOTTLES DRAWN AEROBIC AND ANAEROBIC Blood Culture results may not be optimal due to an inadequate volume of blood received in culture bottles   Culture   Final    NO GROWTH 4 DAYS Performed at [facility], [place] [address] [place],  [address]    Report Status PENDING  Incomplete  Culture, blood (routine x 2) Call MD if unable to obtain prior to antibiotics being given     Status: None (  Preliminary result)   Collection Time: 08/09/19  8:10 PM   Specimen: BLOOD RIGHT FOREARM  Result Value Ref Range Status   Specimen Description BLOOD RIGHT FOREARM  Final   Special Requests   Final    BOTTLES DRAWN AEROBIC AND ANAEROBIC Blood Culture adequate volume   Culture   Final     NO GROWTH 4 DAYS Performed at [facility], [address], [place], [place] [address]    Report Status PENDING  Incomplete  MRSA PCR Screening     Status: None   Collection Time: 08/10/19  9:10 PM   Specimen: Nasal Mucosa; Nasopharyngeal  Result Value Ref Range Status   MRSA by PCR NEGATIVE NEGATIVE Final    Comment:        The GeneXpert MRSA Assay (FDA approved for NASAL specimens only), is one component of a comprehensive MRSA colonization surveillance program. It is not intended to diagnose MRSA infection nor to guide or monitor treatment for MRSA infections. Performed at [facility], [place] [address], [place], [place] [address]   Gram stain     Status: None   Collection Time: 08/13/19 10:24 AM   Specimen: Pleura  Result Value Ref Range Status   Specimen Description PLEURAL RIGHT  Final   Special Requests NONE  Final   Gram Stain   Final    FEW WBC PRESENT, PREDOMINANTLY MONONUCLEAR NO ORGANISMS SEEN Performed at [facility], [address] [place], [place] [address]    Report Status 08/13/2019 FINAL  Final         Radiology Studies: Dg Chest 1 View  Result Date: 08/13/2019 CLINICAL DATA:  Status post thoracentesis. EXAM: CHEST  1 VIEW COMPARISON:  Same day. FINDINGS: Stable cardiomegaly. No pneumothorax is noted. Stable bilateral lung opacities are noted concerning for edema or pneumonia. Grossly stable bilateral pleural effusions are noted. Bony thorax is unremarkable. IMPRESSION: Stable bilateral lung opacities and pleural effusions as described above. No pneumothorax is noted status post thoracentesis. Electronically Signed   By: Marijo Conception M.D.   On: 08/13/2019 10:58   Dg Chest Port 1 View  Result Date: 08/13/2019 CLINICAL DATA:  Respiratory failure. EXAM: PORTABLE CHEST 1 VIEW COMPARISON:  Radiograph of August 11, 2019. FINDINGS: Stable cardiomegaly. Stable bilateral lung opacities are noted concerning for edema or pneumonia. Bilateral  pleural effusions are noted. No pneumothorax is noted. Bony thorax is unremarkable. IMPRESSION: Stable bilateral lung opacities as described above. Electronically Signed   By: Marijo Conception M.D.   On: 08/13/2019 09:44        Scheduled Meds:  aspirin EC  81 mg Oral Daily   bisoprolol  5 mg Oral QHS   Chlorhexidine Gluconate Cloth  6 each Topical Daily   furosemide  60 mg Intravenous Q12H   hydrocortisone   Rectal BID   [START ON 08/15/2019] influenza vaccine adjuvanted  0.5 mL Intramuscular Tomorrow-1000   insulin aspart  0-20 Units Subcutaneous TID WC   insulin aspart  0-5 Units Subcutaneous QHS   insulin glargine  20 Units Subcutaneous Daily   ipratropium-albuterol  3 mL Nebulization TID   isosorbide-hydrALAZINE  1 tablet Oral TID   mouth rinse  15 mL Mouth Rinse BID   nicotine  14 mg Transdermal Daily   oxybutynin  5 mg Oral TID AC & HS   pantoprazole  40 mg Oral BID   polyethylene glycol  17 g Oral Daily   potassium chloride  30 mEq  Oral BID   rosuvastatin  20 mg Oral q1800   saccharomyces boulardii  250 mg Oral BID   tamsulosin  0.4 mg Oral QPC supper   Continuous Infusions:  cefTRIAXone (ROCEPHIN)  IV Stopped (08/13/19 1753)     LOS: 5 days    Time spent: 30 minutes   Noe Gens, AG-ACNP-S [place] [facility] 08/14/2019, 7:13 AM     Please page through [place]:  [email or web address] Password [email or web address] If 7PM-7AM, please contact night-coverage       Attending MD Note:  I have seen and examined the patient with nurse practitioner/physician assistant and agree with the note above which has been edited to reflect our agreed upon history, exam, and assessment/plan.   I have personally reviewed the orders for the patient, which were made under my direction.    Mr. Methot is a 73 y.o. M with HTN, pAF on Eliquis, ACD, sCHF EF 40-45%, DM with neuropathy, WC bound, COPD on 3L O2, OSA who presneted with 3 days  productive cough and chills.  Found to have respiratory failure from pneumonia and CHF.    General appearance:  Adult male, sitting in recliner, no acute distress.   HEENT:  Anicteric, conjunctival pink, lids lashes normal.  No nasal deformity, discharge, or epistaxis.  Lips moist, edentulous, oropharynx moist, no oral lesions, hearing normal.    Skin:  Skin warm and dry, no suspicious rashes or lesions. Cardiac:  Regular rate and rhythm, no murmurs, JVP not visible due to body habitus, trace pitting edema above his wraps    Respiratory:  Normal respiratory effort, wheezing bilaterally, no crackles,   Abdomen:  Abdomen soft, no tenderness to palpation or guarding, no ascites or distention.  MSK:   Neuro:  Awake and alert, extraocular movements intact, moves all extremities with normal strength and coordination, speech fluent.    Psych:  Sensorium intact responding to questions, attention normal, affect normal, judgment insight appear normal.            Acute on Chronic Hypoxemic / Hypercarbic Respiratory Failure In setting of RLL pneumonia with large compressive effusion  On 3-4L at baseline.     Recurrent RLL PNA  Large RIGHT Pleural Effusion  Admitted and started on ceftriaxone.  SLP consulted, no overt aspiration.   Effusion with associated consolidation on CT.  Congestive vs parapneumonic vs malignant.  Thoracentesis was borderline by Lights criteria, but we suspect this was from diuresis of a congestive effusion.  Afebrile. -Continue ceftriaxone day 6 of 7  -Follow-up pleural fluid culture, cytology -Repeat CT imaging 4-6 weeks to ensure resolution of RLL   COPD with Acute Exacerbation  Minimal wheezing, patient refused Solu-Medrol after 3 days. -Continue BDs, scheduled and PRN -Resume home symbicort   Acute on chronic systolic CHF Stable.  Negative 1100cc yesterday, 5.4L on admission. Cr improved, K low. Swelling improved. -Transition to oral  diuretic, torsemide -Continue K supplement -Strict I/Os, daily weights, telemetry  -Daily monitoring renal function -Continue Bidil, BB  Paroxysmal atrial fibrillation Rate controlled -Continue BB -Resume Eliquis  Suspected OSA -Continue BiPAP QHS + PRN daytime sleep  -Plan for outpatient PSG  Hx HTN, AF/Flutter, HLD --Continue lower extremity compression wraps -Continue ASA, zebeta, bidil, crestor -Resume Eliquis  Hypokalemia -Replete K  CKD III Creatinine baseline 1.6-2, stable today. -trend BMP / UOP  -avoid nephrotoxic agents as able  Anemia  ACD with superimposed critical illness, Hgb stable, no clinical bleeding -trend CBC -transfuse for Hgb <7%  Diabetes with Steroid Induced Hyperglycemia  Glucoses improved -wean steroids as clinical status allows -Continue SSI -Continue Lantus  Peripheral Neuropathy  -PRN percocet   BPH -Continue Flomax   Stage I Pressure Injury Right buttock, present on admit  -wound care per nursing / [place]  -mobilize        MDM The below labs and imaging reports reviewed and summarized above.  Medication management as above.    We will transition to oral Lasix today, monitor urine output, hopefully home tomorrow.  The patient was admitted for severe exacerbation of CHF, complicated by pneumonia with pleural effusion, further inpatient management is warranted given the severity of presentation, and the high risk of readmission or morbidity or mortality if he were Prematurely discharged.       [place]

## 2019-08-15 ENCOUNTER — Inpatient Hospital Stay (HOSPITAL_COMMUNITY): Payer: No Typology Code available for payment source

## 2019-08-15 DIAGNOSIS — J9819 Other pulmonary collapse: Secondary | ICD-10-CM | POA: Diagnosis not present

## 2019-08-15 LAB — BASIC METABOLIC PANEL
Anion gap: 14 (ref 5–15)
BUN: 54 mg/dL — ABNORMAL HIGH (ref 8–23)
CO2: 39 mmol/L — ABNORMAL HIGH (ref 22–32)
Calcium: 8.3 mg/dL — ABNORMAL LOW (ref 8.9–10.3)
Chloride: 88 mmol/L — ABNORMAL LOW (ref 98–111)
Creatinine, Ser: 1.7 mg/dL — ABNORMAL HIGH (ref 0.61–1.24)
GFR calc Af Amer: 46 mL/min — ABNORMAL LOW (ref >60)
GFR calc non Af Amer: 40 mL/min — ABNORMAL LOW (ref >60)
Glucose, Bld: 127 mg/dL — ABNORMAL HIGH (ref 70–99)
Potassium: 3.3 mmol/L — ABNORMAL LOW (ref 3.5–5.1)
Sodium: 141 mmol/L (ref 135–145)

## 2019-08-15 LAB — GLUCOSE, CAPILLARY: Glucose-Capillary: 149 mg/dL — ABNORMAL HIGH (ref 70–99)

## 2019-08-15 MED ORDER — POTASSIUM CHLORIDE CRYS ER 20 MEQ PO TBCR: 30.0000 meq | EXTENDED_RELEASE_TABLET | Freq: Once | ORAL | Status: DC

## 2019-08-15 MED ORDER — IPRATROPIUM-ALBUTEROL 0.5-2.5 (3) MG/3ML IN SOLN: 3.0000 mL | Freq: Four times a day (QID) | RESPIRATORY_TRACT | 0 refills | Status: DC | PRN

## 2019-08-15 NOTE — Progress Notes (Signed)
PT Cancellation Note  Patient Details Name: Nicholas Caldwell MRN: [id number] DOB: [birth year]   Cancelled Treatment:    Reason Eval/Treat Not Completed: (P) Patient declined, no reason specified Pt reports he does not want to work with therapy, because "I don't want to fall and slow down my discharge." PT not confident of his safety with d/c home.  Birch Farino B. Migdalia Dk PT, DPT Acute Rehabilitation Services Pager [phone] Office [phone]    [place] 08/15/2019, 10:02 AM

## 2019-08-15 NOTE — Progress Notes (Signed)
The patient had 7 beats of ventricular tachycardia; he was asymptomatic of event.  The on call provider has been notified.

## 2019-08-15 NOTE — TOC Transition Note (Addendum)
Transition of Care [facility]) - CM/SW Discharge Note   Patient Details  Name: Nicholas Caldwell MRN: [id number] Date of Birth: February 04, 1946  [facility] CM/SW Contact:  [name], RN Phone Number: 08/15/2019, 10:00 AM   Clinical Narrative:   Pt to discharge home today with wife - pt informed CM that his wife will provide recommended 24 hour supervision at discharge.  Pt is a readmit now with recurrent pleural effusions, and PNA previous admit included CHF exercerbation.   Pt is active with [facility] - pt wants to remain with [facility].  CM requested resumption orders and informed [facility] that pt is admitted and will discharge home today.  Pt denied [place] needs.   Per bedside nurse pt is on baseline oxygen need - pt confirms his wife will bring portable tank for discharge home.    [facility] (had to leave a VM as today is a holiday for [place]) and informed of admit and discharge - [facility] on post discharge appt.  CM requests medication reconciliation so discharge medications can be reviewed prior to pt leaving facility.     Final next level of care: [place] Barriers to Discharge: Barriers Resolved   Patient Goals and CMS Choice Patient states their goals for this hospitalization and ongoing recovery are:: Pt would not speifiy any goals for this hospitalization nor ongoing recovery care CMS Medicare.gov Compare Post Acute Care list provided to:: Patient Choice offered to / list presented to : Patient  Discharge Placement                       Discharge Plan and Services                          HH Arranged: RN, PT [facility] Agency: [place] ([place]) Date [place]: 08/15/19 Time [place]: [phone] Representative spoke with at [place]: [place] ([place]) Interventions     Readmission Risk Interventions Readmission Risk Prevention Plan 06/13/2019 04/14/2019  04/14/2019  Transportation Screening Complete Complete Complete  Medication Review Press photographer) Complete Complete Complete  PCP or Specialist appointment within 3-5 days of discharge Complete Complete -  PCP/Specialist Appt Not Complete comments - 6/17 with cardiology -  [place] or Home Care Consult Complete Complete -  SW Recovery Care/Counseling Consult Complete Complete -  Palliative Care Screening Not Applicable Not Applicable -  [place] Not Applicable Not Applicable -  Some recent data might be hidden

## 2019-08-16 LAB — CYTOLOGY - NON PAP

## 2019-08-17 ENCOUNTER — Telehealth (HOSPITAL_COMMUNITY): Payer: Self-pay

## 2019-08-17 ENCOUNTER — Other Ambulatory Visit (HOSPITAL_COMMUNITY): Payer: Self-pay

## 2019-08-17 NOTE — Telephone Encounter (Signed)
Received call from [facility] care. Pt recently hospitalized and requires new orders for nursing services.  Verbal orders given to continue nursing services

## 2019-08-17 NOTE — Progress Notes (Signed)
Paramedicine Encounter    Patient ID: Nicholas Caldwell, male    DOB: 05-Sep-1946, 73 y.o.   MRN: [id number]   Patient Care Team: [name], MD as PCP - General (Internal Medicine) Jettie Booze, MD as Consulting Physician (Cardiology) Sharmon Revere as Physician Assistant (Cardiology) [name], MD as Attending Physician (Internal Medicine) Thompson Grayer, MD as Consulting Physician (Clinical Cardiac Electrophysiology)  Patient Active Problem List   Diagnosis Date Noted  . Palliative care by specialist   . DNR (do not resuscitate) discussion   . Fatigue   . Acute CHF (congestive heart failure) ([place]) 07/07/2019  . Acute on chronic heart failure ([place]) 06/09/2019  . Lobar pneumonia ([place]) 04/14/2019  . Hyperkalemia 04/10/2019  . Cerebral embolism with cerebral infarction 03/21/2019  . Gastritis and gastroduodenitis   . NSVT (nonsustained ventricular tachycardia) ([place]) 03/08/2019  . Tobacco abuse counseling 03/08/2019  . Iron deficiency anemia   . Acute on chronic systolic CHF (congestive heart failure) ([place]) 03/06/2019  . Diabetes mellitus type 2 in obese ([place])   . Primary osteoarthritis of right hip   . Hypomagnesemia   . Steroid-induced hyperglycemia   . Supplemental oxygen dependent   . Leukocytosis   . Hypokalemia   . Acute on chronic anemia   . Diabetic peripheral neuropathy ([place])   . Acute on chronic systolic (congestive) heart failure ([place])   . Acute on chronic respiratory failure ([place]) 01/14/2019  . Hypoxia   . Altered mental status   . Heart failure ([place]) 12/30/2018  . AKI (acute kidney injury) ([place])   . Acute respiratory failure ([place]) 11/22/2018  . Acute on chronic respiratory failure with hypoxia and hypercapnia ([place]) 11/13/2018  . Metabolic encephalopathy 94/05/6807  . Acute respiratory failure with hypoxia and hypercapnia ([place]) 07/26/2018  . Acute exacerbation of CHF (congestive heart failure) ([place]) 07/26/2018  . CHF exacerbation ([place])  05/01/2018  . CHF (congestive heart failure) ([place]) 04/30/2018  . Chronic respiratory failure with hypoxia ([place]) 12/28/2017  . Acute encephalopathy   . Atrial fibrillation with RVR ([place])   . SVT (supraventricular tachycardia) ([place])   . COPD GOLD 0   . Medically noncompliant   . Panlobular emphysema ([place])   . OSA (obstructive sleep apnea)   . Pulmonary hypertension ([place])   . Disorientation   . Pressure injury of skin 09/07/2016  . Acute on chronic combined systolic and diastolic CHF (congestive heart failure) ([place]) 09/05/2016  . Skin lesion-left heal 09/05/2016  . Chronic systolic CHF (congestive heart failure) ([place]) 07/16/2016  . COPD (chronic obstructive pulmonary disease) ([place]) 07/16/2016  . Uncontrolled type 2 diabetes mellitus with complication ([place])   . Diabetic polyneuropathy associated with diabetes mellitus due to underlying condition ([place])   . Normocytic anemia 06/29/2016  . CAD - Non-obstructive by LHC1/16 01/21/2016  . Prolonged QT interval 10/09/2015  . Nonischemic cardiomyopathy ([place]) 10/09/2015  . PAF (paroxysmal atrial fibrillation) ([place])   . Chronic pain 02/19/2015  . Morbid obesity ([place]) 02/13/2015  . Dyspnea   . Elevated troponin I level 11/01/2014  . COPD exacerbation ([place]) 11/01/2014  . Essential hypertension 10/31/2014  . Type 2 diabetes mellitus with neuropathy 10/31/2014  . Hyperlipidemia  10/31/2014  . Cigarette smoker 10/31/2014    Current Outpatient Medications:  .  albuterol (VENTOLIN HFA) 108 (90 Base) MCG/ACT inhaler, Inhale 2 puffs into the lungs every 6 (six) hours as needed for wheezing or shortness of breath., Disp: , Rfl:  .  apixaban (ELIQUIS) 5 MG TABS tablet, Take  1 tablet (5 mg total) by mouth 2 (two) times daily., Disp: 60 tablet, Rfl: 0 .  aspirin EC 81 MG EC tablet, Take 1 tablet (81 mg total) by mouth daily., Disp: 30 tablet, Rfl: 0 .  bisoprolol (ZEBETA) 5 MG tablet, Take 1 tablet (5 mg total) by mouth daily. (Patient taking  differently: Take 5 mg by mouth at bedtime. ), Disp: 90 tablet, Rfl: 3 .  budesonide-formoterol (SYMBICORT) 160-4.5 MCG/ACT inhaler, Inhale 2 puffs into the lungs as needed (shortness of breath). , Disp: , Rfl:  .  ferrous sulfate 325 (65 FE) MG tablet, Take 325 mg by mouth 2 (two) times daily., Disp: , Rfl:  .  fluticasone (FLONASE) 50 MCG/ACT nasal spray, Place 2 sprays into both nostrils daily as needed for allergies. , Disp: , Rfl:  .  gabapentin (NEURONTIN) 300 MG capsule, Take 1 capsule (300 mg total) by mouth 2 (two) times daily., Disp: 60 capsule, Rfl: 0 .  hydrocortisone (ANUSOL-HC) 2.5 % rectal cream, Place 1 application rectally 2 (two) times daily as needed for hemorrhoids or itching. , Disp: , Rfl:  .  hydrocortisone 1 % lotion, Apply 1 application topically See admin instructions. Apply small amount to back twice daily for itchy rash as needed, Disp: , Rfl:  .  insulin aspart (NOVOLOG) 100 UNIT/ML injection, Inject 4 Units into the skin 3 (three) times daily with meals., Disp: 10 mL, Rfl: 0 .  insulin aspart protamine - aspart (NOVOLOG MIX 70/30 FLEXPEN) (70-30) 100 UNIT/ML FlexPen, Inject 0.1 mLs (10 Units total) into the skin 2 (two) times daily., Disp: 15 mL, Rfl: 0 .  ipratropium-albuterol (DUONEB) 0.5-2.5 (3) MG/3ML SOLN, Take 3 mLs by nebulization every 6 (six) hours as needed (shortness of breath/wheezing)., Disp: 360 mL, Rfl: 0 .  isosorbide-hydrALAZINE (BIDIL) 20-37.5 MG tablet, Take 0.5 tablets by mouth 3 (three) times daily., Disp: 135 tablet, Rfl: 3 .  metolazone (ZAROXOLYN) 2.5 MG tablet, Take 1 tablet (2.5 mg total) by mouth 4 (four) times a week. Take extra Potassium 47mq when taking this medication, Disp: 30 tablet, Rfl: 3 .  oxybutynin (DITROPAN) 5 MG tablet, Take 1 tablet (5 mg total) by mouth 4 (four) times daily., Disp: 120 tablet, Rfl: 0 .  oxyCODONE-acetaminophen (PERCOCET) 10-325 MG tablet, Take 1 tablet by mouth every 6 (six) hours as needed for pain. , Disp: ,  Rfl:  .  OXYGEN, Inhale 3 L into the lungs continuous. , Disp: , Rfl:  .  pantoprazole (PROTONIX) 40 MG tablet, Take 1 tablet (40 mg total) by mouth 2 (two) times daily., Disp: 60 tablet, Rfl: 0 .  polyethylene glycol (MIRALAX / GLYCOLAX) packet, Take 17 g by mouth daily., Disp: 14 each, Rfl: 0 .  potassium chloride SA (K-DUR) 20 MEQ tablet, Take 3 tablets (60 mEq total) by mouth 2 (two) times daily. With additional 24m when you take Metolazone., Disp: 90 tablet, Rfl: 3 .  PRESCRIPTION MEDICATION, Cpap, Disp: , Rfl:  .  rosuvastatin (CRESTOR) 20 MG tablet, Take 1 tablet (20 mg total) by mouth daily at 6 PM., Disp: 90 tablet, Rfl: 3 .  sodium chloride (OCEAN) 0.65 % SOLN nasal spray, Place 2 sprays into both nostrils as needed for congestion. , Disp: , Rfl:  .  tamsulosin (FLOMAX) 0.4 MG CAPS capsule, Take 1 capsule (0.4 mg total) by mouth daily after supper., Disp: 30 capsule, Rfl: 3 .  torsemide (DEMADEX) 20 MG tablet, Take 4 tablets (80 mg total) by mouth 2 (two) times  daily., Disp: 180 tablet, Rfl: 3 No Known Allergies    Social History   Socioeconomic History  . Marital status: Married    Spouse name: Not on file  . Number of children: Not on file  . Years of education: Not on file  . Highest education level: Not on file  Occupational History  . Not on file  Social Needs  . Financial resource strain: Not on file  . Food insecurity    Worry: Not on file    Inability: Not on file  . Transportation needs    Medical: Not on file    Non-medical: Not on file  Tobacco Use  . Smoking status: Former Smoker    Packs/day: 1.00    Years: 34.00    Pack years: 34.00    Types: Cigarettes    Quit date: 11/30/2018    Years since quitting: 0.7  . Smokeless tobacco: Never Used  Substance and Sexual Activity  . Alcohol use: No    Alcohol/week: 0.0 standard drinks  . Drug use: No  . Sexual activity: Not on file  Lifestyle  . Physical activity    Days per week: Not on file    Minutes  per session: Not on file  . Stress: Not on file  Relationships  . Social Herbalist on phone: Not on file    Gets together: Not on file    Attends religious service: Not on file    Active member of club or organization: Not on file    Attends meetings of clubs or organizations: Not on file    Relationship status: Not on file  . Intimate partner violence    Fear of current or ex partner: Not on file    Emotionally abused: Not on file    Physically abused: Not on file    Forced sexual activity: Not on file  Other Topics Concern  . Not on file  Social History Narrative  . Not on file    Physical Exam      Future Appointments  Date Time Provider [place]  08/18/2019  9:20 AM Larey Dresser, MD [facility] None  08/31/2019  1:30 PM [facility] PA/NP [facility] None    BP (!) 104/50   Pulse 74   Temp 98.6 F (37 C)   Resp 18   Wt 256 lb (116.1 kg)   SpO2 96%   BMI 34.72 kg/m   Weight yesterday-254   Pt home from hosp Monday from an admission. He reports feeling better. He does have wheeze to the left side upper and lower lobe. He denies sob, no c/p. No dizziness.  His legs are wrapped. PT was here today. He reports his legs are sore.  He has not used his inhaler/neb tx today yet.  meds verified and pill box refilled.  He prefers to take his potassium out separate but the extra one of metolazone days is placed in pill box.  He has heart clinic appoint tomor and I will f/u on Monday.   Marylouise Stacks, [place] [facility] Paramedic  08/17/19

## 2019-08-18 ENCOUNTER — Other Ambulatory Visit: Payer: Self-pay

## 2019-08-18 ENCOUNTER — Encounter (HOSPITAL_COMMUNITY): Payer: Self-pay | Admitting: Cardiology

## 2019-08-18 ENCOUNTER — Ambulatory Visit (HOSPITAL_BASED_OUTPATIENT_CLINIC_OR_DEPARTMENT_OTHER)
Admission: RE | Admit: 2019-08-18 | Discharge: 2019-08-18 | Disposition: A | Discharge status: Home / Self Care | Payer: Medicare Other | Source: Ambulatory Visit | Attending: Cardiology | Admitting: Cardiology

## 2019-08-18 ENCOUNTER — Ambulatory Visit (HOSPITAL_COMMUNITY)
Admission: RE | Admit: 2019-08-18 | Discharge: 2019-08-18 | Disposition: A | Discharge status: Home / Self Care | Payer: Medicare Other | Source: Ambulatory Visit | Attending: Cardiology | Admitting: Cardiology

## 2019-08-18 ENCOUNTER — Encounter (HOSPITAL_COMMUNITY): Payer: Self-pay

## 2019-08-18 VITALS — BP 148/80 | HR 70 | Wt 256.0 lb

## 2019-08-18 DIAGNOSIS — I110 Hypertensive heart disease with heart failure: Secondary | ICD-10-CM | POA: Insufficient documentation

## 2019-08-18 DIAGNOSIS — E785 Hyperlipidemia, unspecified: Secondary | ICD-10-CM | POA: Insufficient documentation

## 2019-08-18 DIAGNOSIS — J9622 Acute and chronic respiratory failure with hypercapnia: Secondary | ICD-10-CM | POA: Diagnosis not present

## 2019-08-18 DIAGNOSIS — Z79899 Other long term (current) drug therapy: Secondary | ICD-10-CM | POA: Insufficient documentation

## 2019-08-18 DIAGNOSIS — I480 Paroxysmal atrial fibrillation: Secondary | ICD-10-CM | POA: Insufficient documentation

## 2019-08-18 DIAGNOSIS — I5042 Chronic combined systolic (congestive) and diastolic (congestive) heart failure: Secondary | ICD-10-CM | POA: Insufficient documentation

## 2019-08-18 DIAGNOSIS — G9341 Metabolic encephalopathy: Secondary | ICD-10-CM | POA: Diagnosis not present

## 2019-08-18 DIAGNOSIS — J9610 Chronic respiratory failure, unspecified whether with hypoxia or hypercapnia: Secondary | ICD-10-CM | POA: Diagnosis not present

## 2019-08-18 DIAGNOSIS — Z9911 Dependence on respirator [ventilator] status: Secondary | ICD-10-CM | POA: Diagnosis not present

## 2019-08-18 DIAGNOSIS — Z7951 Long term (current) use of inhaled steroids: Secondary | ICD-10-CM | POA: Insufficient documentation

## 2019-08-18 DIAGNOSIS — J449 Chronic obstructive pulmonary disease, unspecified: Secondary | ICD-10-CM | POA: Insufficient documentation

## 2019-08-18 DIAGNOSIS — G4733 Obstructive sleep apnea (adult) (pediatric): Secondary | ICD-10-CM | POA: Insufficient documentation

## 2019-08-18 DIAGNOSIS — I2510 Atherosclerotic heart disease of native coronary artery without angina pectoris: Secondary | ICD-10-CM | POA: Insufficient documentation

## 2019-08-18 DIAGNOSIS — I130 Hypertensive heart and chronic kidney disease with heart failure and stage 1 through stage 4 chronic kidney disease, or unspecified chronic kidney disease: Secondary | ICD-10-CM | POA: Insufficient documentation

## 2019-08-18 DIAGNOSIS — I5022 Chronic systolic (congestive) heart failure: Secondary | ICD-10-CM | POA: Diagnosis not present

## 2019-08-18 DIAGNOSIS — Z794 Long term (current) use of insulin: Secondary | ICD-10-CM | POA: Insufficient documentation

## 2019-08-18 DIAGNOSIS — F17210 Nicotine dependence, cigarettes, uncomplicated: Secondary | ICD-10-CM | POA: Insufficient documentation

## 2019-08-18 DIAGNOSIS — R402212 Coma scale, best verbal response, none, at arrival to emergency department: Secondary | ICD-10-CM | POA: Diagnosis not present

## 2019-08-18 DIAGNOSIS — J9621 Acute and chronic respiratory failure with hypoxia: Secondary | ICD-10-CM | POA: Diagnosis not present

## 2019-08-18 DIAGNOSIS — I10 Essential (primary) hypertension: Secondary | ICD-10-CM | POA: Diagnosis not present

## 2019-08-18 DIAGNOSIS — E1122 Type 2 diabetes mellitus with diabetic chronic kidney disease: Secondary | ICD-10-CM | POA: Insufficient documentation

## 2019-08-18 DIAGNOSIS — E875 Hyperkalemia: Secondary | ICD-10-CM | POA: Insufficient documentation

## 2019-08-18 DIAGNOSIS — J90 Pleural effusion, not elsewhere classified: Secondary | ICD-10-CM

## 2019-08-18 DIAGNOSIS — I469 Cardiac arrest, cause unspecified: Secondary | ICD-10-CM | POA: Diagnosis not present

## 2019-08-18 DIAGNOSIS — Z8673 Personal history of transient ischemic attack (TIA), and cerebral infarction without residual deficits: Secondary | ICD-10-CM | POA: Insufficient documentation

## 2019-08-18 DIAGNOSIS — R402112 Coma scale, eyes open, never, at arrival to emergency department: Secondary | ICD-10-CM | POA: Diagnosis not present

## 2019-08-18 DIAGNOSIS — J441 Chronic obstructive pulmonary disease with (acute) exacerbation: Secondary | ICD-10-CM | POA: Insufficient documentation

## 2019-08-18 DIAGNOSIS — Z8249 Family history of ischemic heart disease and other diseases of the circulatory system: Secondary | ICD-10-CM | POA: Insufficient documentation

## 2019-08-18 DIAGNOSIS — Z20828 Contact with and (suspected) exposure to other viral communicable diseases: Secondary | ICD-10-CM | POA: Diagnosis not present

## 2019-08-18 DIAGNOSIS — N1832 Chronic kidney disease, stage 3b: Secondary | ICD-10-CM | POA: Insufficient documentation

## 2019-08-18 DIAGNOSIS — R402312 Coma scale, best motor response, none, at arrival to emergency department: Secondary | ICD-10-CM | POA: Diagnosis not present

## 2019-08-18 DIAGNOSIS — Z7901 Long term (current) use of anticoagulants: Secondary | ICD-10-CM | POA: Diagnosis not present

## 2019-08-18 DIAGNOSIS — I5043 Acute on chronic combined systolic (congestive) and diastolic (congestive) heart failure: Secondary | ICD-10-CM | POA: Diagnosis not present

## 2019-08-18 LAB — CULTURE, BODY FLUID W GRAM STAIN -BOTTLE: Culture: NO GROWTH

## 2019-08-18 LAB — BASIC METABOLIC PANEL
Anion gap: 11 (ref 5–15)
BUN: 57 mg/dL — ABNORMAL HIGH (ref 8–23)
CO2: 36 mmol/L — ABNORMAL HIGH (ref 22–32)
Calcium: 8.3 mg/dL — ABNORMAL LOW (ref 8.9–10.3)
Chloride: 90 mmol/L — ABNORMAL LOW (ref 98–111)
Creatinine, Ser: 2.38 mg/dL — ABNORMAL HIGH (ref 0.61–1.24)
GFR calc Af Amer: 31 mL/min — ABNORMAL LOW (ref >60)
GFR calc non Af Amer: 26 mL/min — ABNORMAL LOW (ref >60)
Glucose, Bld: 231 mg/dL — ABNORMAL HIGH (ref 70–99)
Potassium: 4.7 mmol/L (ref 3.5–5.1)
Sodium: 137 mmol/L (ref 135–145)

## 2019-08-18 IMAGING — CR DG CHEST 2V
2 series · 2 of 2 positions shown · non-contrast
Comparison: Chest radiograph 12/28/2017

CLINICAL DATA: Shortness of breath

EXAM:
CHEST - 2 VIEW

[chest lat]
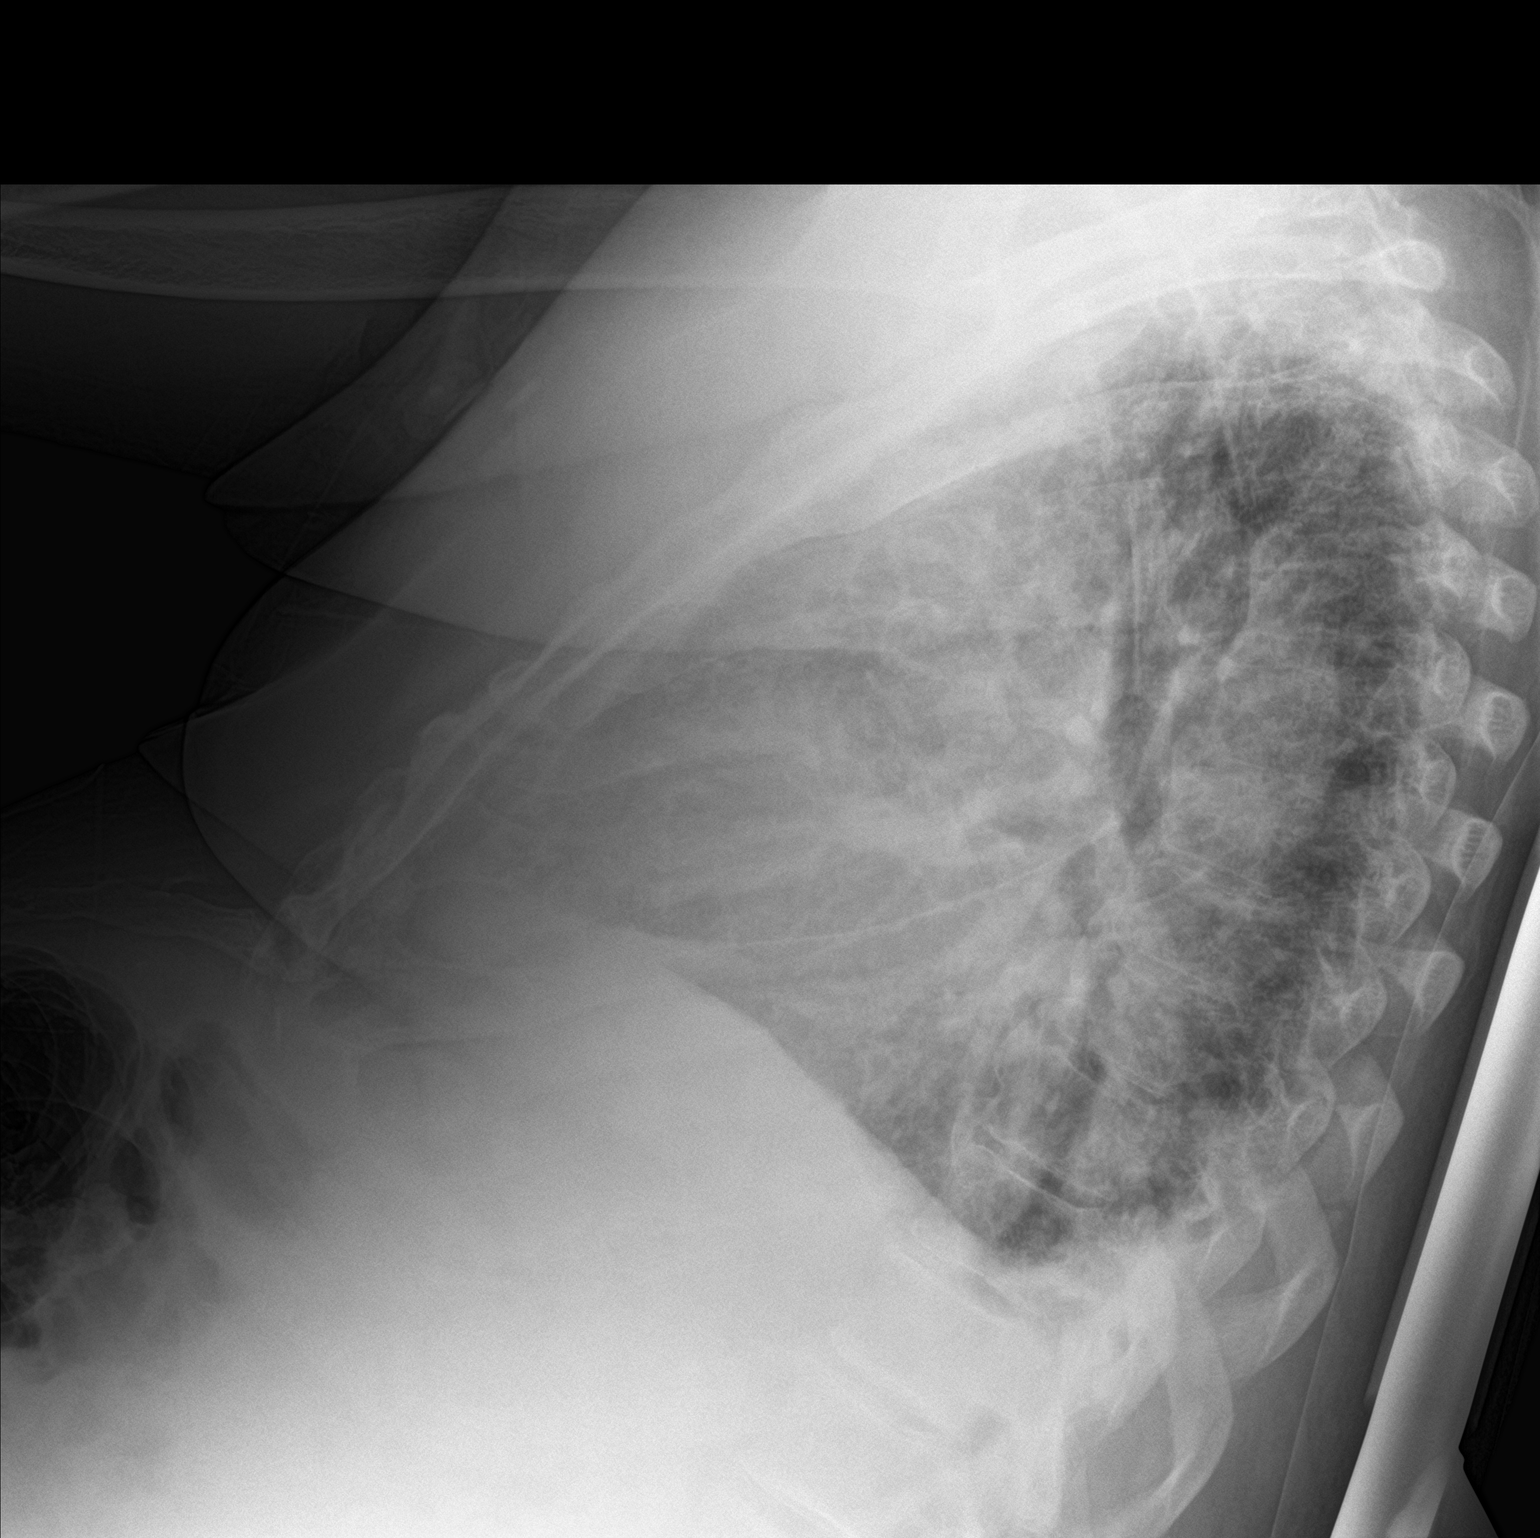

[chest ap]
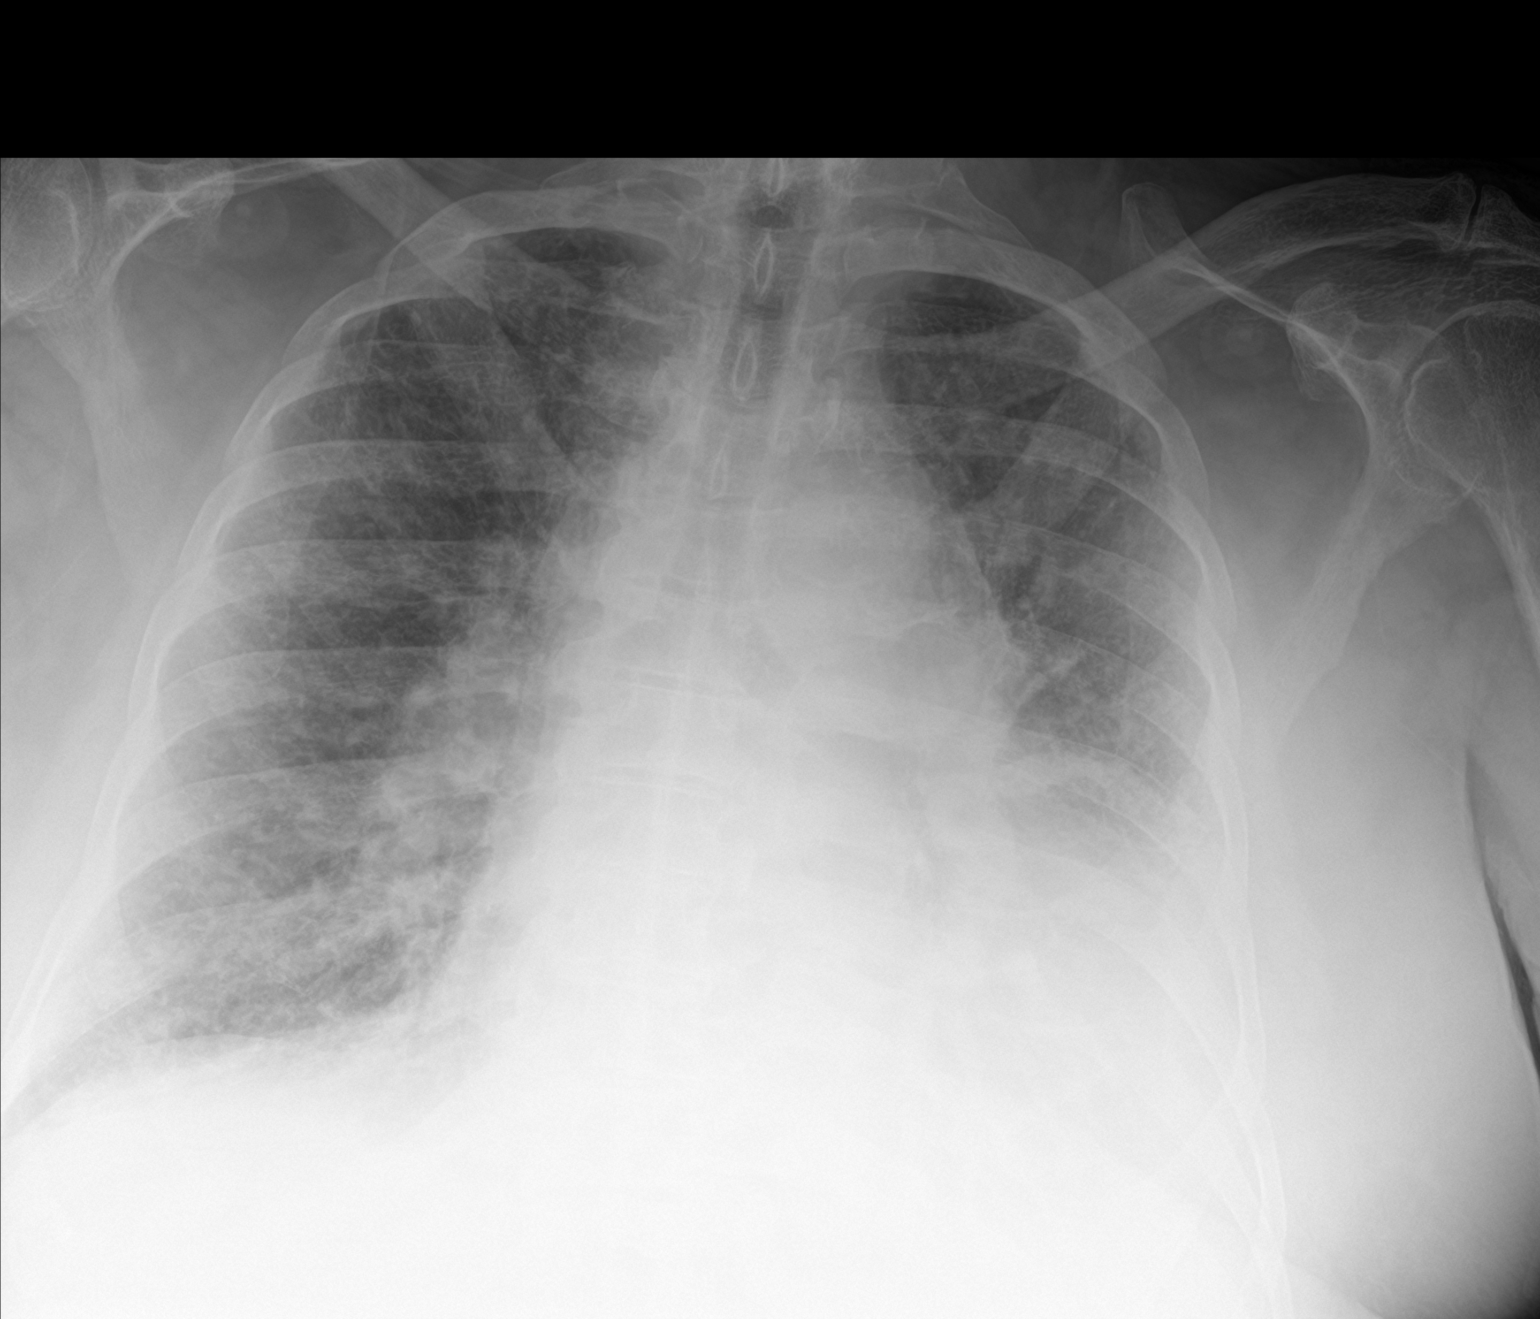

[2 of 2 positions shown; findings below may reference images not displayed]

FINDINGS: There is unchanged cardiomegaly. Moderate pulmonary edema with small
left pleural effusion. No pneumothorax. No acute osseous
abnormality.
IMPRESSION: Cardiomegaly and moderate pulmonary edema.

## 2019-08-18 MED ORDER — ISOSORB DINITRATE-HYDRALAZINE 20-37.5 MG PO TABS: 1.0000 | ORAL_TABLET | Freq: Three times a day (TID) | ORAL | 3 refills | Status: DC

## 2019-08-18 MED ORDER — TORSEMIDE 20 MG PO TABS: 100.0000 mg | ORAL_TABLET | Freq: Two times a day (BID) | ORAL | 3 refills | Status: DC

## 2019-08-18 NOTE — Addendum Note (Signed)
Encounter addended by: Larey Dresser, MD on: 08/18/2019 11:06 PM  Actions taken: Clinical Note Signed

## 2019-08-18 NOTE — Patient Instructions (Addendum)
STOP Aspirin INCREASE Torsemide to 100 mg (5 tabs) twice daily INCREASE Bidil to 1 tab three times daily  Labs today We will only contact you if something comes back abnormal or we need to make some changes. Otherwise no news is good news!  Labs needed in 7-10 days  A chest x-ray takes a picture of the organs and structures inside the chest, including the heart, lungs, and blood vessels. This test can show several things, including, whether the heart is enlarges; whether fluid is building up in the lungs; and whether pacemaker / defibrillator leads are still in place.  Your physician recommends that you schedule a follow-up appointment in: 3 weeks  in the Advanced Practitioners (PA/NP) Clinic   Do the following things EVERYDAY: 1) Weigh yourself in the morning before breakfast. Write it down and keep it in a log. 2) Take your medicines as prescribed 3) Eat low salt foods-Limit salt (sodium) to 2000 mg per day.  4) Stay as active as you can everyday 5) Limit all fluids for the day to less than 2 liters  At the [facility], you and your health needs are our priority. As part of our continuing mission to provide you with exceptional heart care, we have created designated Provider Care Teams. These Care Teams include your primary Cardiologist (physician) and Advanced Practice Providers (APPs- Physician Assistants and Nurse Practitioners) who all work together to provide you with the care you need, when you need it.   You may see any of the following providers on your designated Care Team at your next follow up: Marland Kitchen Dr Glori Bickers . Dr Loralie Champagne . Darrick Grinder, NP . Lyda Jester, PA   Please be sure to bring in all your medications bottles to every appointment.

## 2019-08-18 NOTE — Progress Notes (Addendum)
Heart Failure  Note Date:  08/18/2019   ID:  Nicholas Caldwell, DOB 1945/12/28, MRN [id number]   Provider location: [facility] Type of Visit: Established patient  PCP:  [name], MD  HF Cardiology: Dr [name]   Chief Complaint: Heart Failure    History of Present Illness: Nicholas Caldwell is a 73 y.o. male with a history of chronic systolic CHFdue to NICM (EF 40-45%)with moderate RV failure, COPD on 3 L O2, DM, HTN, OHS/OSA on CPAP, PAF s/p DCCV, hyperlipidemia, CAD, and noncompliance.He has multiple admissions due to HF and respiratory failure.   Most recently admitted from 03/07/19-03/31/19 with massive volume overload with severe RV failure, AFL and cardiorenal syndrome. Attempts at diuresis limited by worsening creatinine which peaked at 2.6. Treated with milrinone but still unable to get CVP below 15. Hospital course c/b severe non-compliance with high fluid intake, PAFL, penile wound, acute versus subacute CVA and Citrobacter UTI. Underwent DC-CV of AFL into NSR.  D/c'd home on 5/28 with weight of 270 and creatinine 1.99. Readmitted6/6/2020with recurrent hypoxic/hypercarbic respiratory failure. (ABG 7.27/66/55/86%). Found to have severe hyperkalemia (K>7.5) and large left pleural effusion. Weightwasup about 10 pounds since d/c.Diuresed with IV lasix and later transitioned to torsemide 80 mg twice a day.  Discharge weight on 04/14/2019 was 251 pounds.   Admitted 04/06/32 with A/C systolic/diastolic heart failure. Bisoprolol was stopped on admit and not continued on discharge. Diuresed with with IV lasix and transitioned back to torsemide 80 mg twice a day + metolazone 4 times a week.   Admitted 12/12/49 with A/C systolic/diastolic heart failure + AECOPD. Diuresed with IV lasix and discharged on torsemide 80 mg twice a day + metolazone 4x a week. Treated with steroids and antibiotics. He was placed on low dose bisoprolol.  Palliative Care consulted. MOST form was  introduced. He wished to continue full code.   He was admitted again in 10/20 with RLL PNA (treated with ceftriaxone), CHF exacerbation, and right pleural effusion.  He had right-sided thoracentesis.   Today he returns for followup of CHF.  He feels better since last discharge. Still short of breath if he tries to "rush" or with walking more than 50 feet.  Chronic orthopnea.  No chest pain.  No PND.  BP mildly elevated today.   ECG (personally reviewed): NSR, 1st degree AVB, RVH, nonspecific T wave changes.   Labs (10/20): K 3.3, creatinine 1.7  PMH: 1. Chronic systolic CHF: Nonischemic cardiomyopathy, diagnosed in 2016.  - LHC (1/16) with mild nonobstructive CAD.  - Echo (12/17): EF 35-40%, mildly dilated RV with mild to moderately decreased RV systolic function, PASP 57 mmHg.  - Echo (3/20): EF 45-50%, mild LVH, D-shaped septum with severe RV dilation and RV function.  - TEE (3/20): EF 40-45%, moderate LVH, severe RV dilation with moderate dysfunction.  - RHC (2/20): mean RA 10, PA 52/16 mean 30, mean PCWP 10, CI 3.85, PVR 2.2 WU - Echo (9/20): EF >65%, moderate RV dilation with mildly decreased systolic function.  2. Atrial fibrillation: Paroxysmal. Severe LAE, has seen Dr Rayann Heman and thought to have low chance of success with atrial fibrillation ablation. Tikosyn not used due to long QT. He was on amiodarone in the past but this was stopped due to long QT.  - DCCV 3/20, 5/20.  3. COPD: On 3 L home oxygen.  4. Type II diabetes 5. Hyperlipidemia 6. HTN 7. OHS/OSA: Home oxygen + CPAP.  8. CAD: LHC (2/20) with 50-60%  distal LAD, 50% proximal LCx, 50% proximal RCA (nondominant RCA).  9. CVA: 5/20.  10. CKD: Stage 3.  11. ABIs (9/20): normal.   Current Outpatient Medications  Medication Sig Dispense Refill  . albuterol (VENTOLIN HFA) 108 (90 Base) MCG/ACT inhaler Inhale 2 puffs into the lungs every 6 (six) hours as needed for wheezing or shortness of breath.    Marland Kitchen apixaban (ELIQUIS) 5  MG TABS tablet Take 1 tablet (5 mg total) by mouth 2 (two) times daily. 60 tablet 0  . bisoprolol (ZEBETA) 5 MG tablet Take 5 mg by mouth at bedtime.    . budesonide-formoterol (SYMBICORT) 160-4.5 MCG/ACT inhaler Inhale 2 puffs into the lungs as needed (shortness of breath).     . ferrous sulfate 325 (65 FE) MG tablet Take 325 mg by mouth 2 (two) times daily.    . fluticasone (FLONASE) 50 MCG/ACT nasal spray Place 2 sprays into both nostrils daily as needed for allergies.     Marland Kitchen gabapentin (NEURONTIN) 300 MG capsule Take 1 capsule (300 mg total) by mouth 2 (two) times daily. 60 capsule 0  . hydrocortisone (ANUSOL-HC) 2.5 % rectal cream Place 1 application rectally 2 (two) times daily as needed for hemorrhoids or itching.     . hydrocortisone 1 % lotion Apply 1 application topically See admin instructions. Apply small amount to back twice daily for itchy rash as needed    . insulin aspart (NOVOLOG) 100 UNIT/ML injection Inject 4 Units into the skin 3 (three) times daily with meals. 10 mL 0  . insulin aspart protamine - aspart (NOVOLOG MIX 70/30 FLEXPEN) (70-30) 100 UNIT/ML FlexPen Inject 0.1 mLs (10 Units total) into the skin 2 (two) times daily. 15 mL 0  . ipratropium-albuterol (DUONEB) 0.5-2.5 (3) MG/3ML SOLN Take 3 mLs by nebulization every 6 (six) hours as needed (shortness of breath/wheezing). 360 mL 0  . isosorbide-hydrALAZINE (BIDIL) 20-37.5 MG tablet Take 1 tablet by mouth 3 (three) times daily. 270 tablet 3  . metolazone (ZAROXOLYN) 2.5 MG tablet Take 2.5 mg by mouth 4 (four) times a week. Take extra 2mq of potassium when taking this medication    . oxybutynin (DITROPAN) 5 MG tablet Take 1 tablet (5 mg total) by mouth 4 (four) times daily. 120 tablet 0  . oxyCODONE-acetaminophen (PERCOCET) 10-325 MG tablet Take 1 tablet by mouth every 6 (six) hours as needed for pain.     . OXYGEN Inhale 3 L into the lungs continuous.     . pantoprazole (PROTONIX) 40 MG tablet Take 1 tablet (40 mg total) by  mouth 2 (two) times daily. 60 tablet 0  . polyethylene glycol (MIRALAX / GLYCOLAX) packet Take 17 g by mouth daily. 14 each 0  . potassium chloride SA (K-DUR) 20 MEQ tablet Take 3 tablets (60 mEq total) by mouth 2 (two) times daily. With additional 293m when you take Metolazone. 90 tablet 3  . PRESCRIPTION MEDICATION Cpap    . rosuvastatin (CRESTOR) 20 MG tablet Take 1 tablet (20 mg total) by mouth daily at 6 PM. 90 tablet 3  . sodium chloride (OCEAN) 0.65 % SOLN nasal spray Place 2 sprays into both nostrils as needed for congestion.     . tamsulosin (FLOMAX) 0.4 MG CAPS capsule Take 1 capsule (0.4 mg total) by mouth daily after supper. 30 capsule 3  . torsemide (DEMADEX) 20 MG tablet Take 5 tablets (100 mg total) by mouth 2 (two) times daily. 150 tablet 3   No current facility-administered medications for this  encounter.     Allergies:   Patient has no known allergies.   Social History:  The patient  reports that he quit smoking about 8 months ago. His smoking use included cigarettes. He has a 34.00 pack-year smoking history. He has never used smokeless tobacco. He reports that he does not drink alcohol or use drugs.   Family History:  The patient's family history includes Heart disease in his father and mother; Heart failure in his mother; Hypertension in his mother and sister.   ROS:  Please see the history of present illness.   All other systems are personally reviewed and negative.  Wt Readings from Last 3 Encounters:  08/18/19 116.1 kg (256 lb)  08/17/19 116.1 kg (256 lb)  08/15/19 116.4 kg (256 lb 9.6 oz)   Vitals:   08/18/19 0942  BP: (!) 148/80  Pulse: 70  SpO2: 99%    Exam:   General: NAD Neck: JVP 12 cm, no thyromegaly or thyroid nodule.  Lungs: Occasional rhonchi, decreased BS right base.  CV: Nondisplaced PMI.  Heart regular S1/S2, no S3/S4, no murmur.  2+ ankle edema.  No carotid bruit.  Normal pedal pulses.  Abdomen: Soft, nontender, no hepatosplenomegaly, no  distention.  Skin: Intact without lesions or rashes.  Neurologic: Alert and oriented x 3.  Psych: Normal affect. Extremities: No clubbing or cyanosis. Lower legs wrapped.  HEENT: Normal.    Recent Labs: 04/10/2019: TSH 0.587 08/10/2019: ALT 16 08/12/2019: B Natriuretic Peptide 2,708.1 08/13/2019: Magnesium 2.6 08/14/2019: Hemoglobin 8.7; Platelets 221 08/18/2019: BUN 57; Creatinine, Ser 2.38; Potassium 4.7; Sodium 137  Personally reviewed   Wt Readings from Last 3 Encounters:  08/18/19 116.1 kg (256 lb)  08/17/19 116.1 kg (256 lb)  08/15/19 116.4 kg (256 lb 9.6 oz)      ASSESSMENT AND PLAN:  1. Chronic systolic => diastolic CHF: With prominent RV failure, likely related to COPD/OSA/OHS. TEE in 3/20 with EF 40-45%, severely dilated RV with moderately decreased systolic function. However, repeat echo in 9/20 showed EF >65% with moderate RV dilation/mildly decreased RV systolic function. He has had multiple recent admissions with CHF and COPD exacerbations.  He is chronically NYHA class IIIb.  He is on high doses of diuretics at home but appears volume overloaded on exam again today. Very difficult to manage.  - Increase torsemide to 100 mg bid.  Continue metolazone 4 times/week. BMET today and again in 10 days.  - Continue bisoprolol 5 mg daily.  - With elevated BP, I will increase Bidil to 1 tab tid.  2. Atrial fibrillation: Paroxysmal. He has history of CVA.  He had DCCV in 3/20 and again in 5/20. He is in NSR today.  He failed amiodarone in the past and cannot take Tikosyn due to baseline prolonged QT interval.   - Continue apixaban 5 mg bid.   - Poor ablation candidate with LA size 3. COPD: No longer smokes. Continue home oxygen. CT chest showed emphysema.  - Dr. Opal Sidles 4. OHS/OSA: Continue 3 L oxygen during the day and CPAP at night.    5. CAD: Nonobstructive disease in 2/20. No chest pain.  - Continue crestor 20 mg daily. - He is on apixaban so can stop ASA.  6. CKD:  Stage 3.  BMET today.  7. Pleural effusion: S/p recent thoracentesis for right pleural effusion.  Still decreased BS right base.   - I will get a CXR PA/lateral to assess for recurrence of effusion.   He will followup in  3 wks with NP/PA.   Signed, Loralie Champagne, MD  08/18/2019  [place] [address] [facility] [place] [address] [phone] (office) [phone] (fax)

## 2019-08-20 ENCOUNTER — Emergency Department (HOSPITAL_COMMUNITY): Payer: Medicare Other

## 2019-08-20 ENCOUNTER — Encounter (HOSPITAL_COMMUNITY): Payer: Self-pay

## 2019-08-20 ENCOUNTER — Inpatient Hospital Stay (HOSPITAL_COMMUNITY)
Admission: EM | Admit: 2019-08-20 | Discharge: 2019-08-29 | DRG: 291 | Disposition: A | Discharge status: Home Health | Payer: Medicare Other | Attending: Internal Medicine | Admitting: Internal Medicine

## 2019-08-20 DIAGNOSIS — G9340 Encephalopathy, unspecified: Secondary | ICD-10-CM | POA: Diagnosis not present

## 2019-08-20 DIAGNOSIS — I4892 Unspecified atrial flutter: Secondary | ICD-10-CM | POA: Diagnosis not present

## 2019-08-20 DIAGNOSIS — E0865 Diabetes mellitus due to underlying condition with hyperglycemia: Secondary | ICD-10-CM | POA: Diagnosis not present

## 2019-08-20 DIAGNOSIS — N183 Chronic kidney disease, stage 3 unspecified: Secondary | ICD-10-CM | POA: Diagnosis present

## 2019-08-20 DIAGNOSIS — E8770 Fluid overload, unspecified: Secondary | ICD-10-CM | POA: Diagnosis not present

## 2019-08-20 DIAGNOSIS — I5021 Acute systolic (congestive) heart failure: Secondary | ICD-10-CM | POA: Diagnosis not present

## 2019-08-20 DIAGNOSIS — R001 Bradycardia, unspecified: Secondary | ICD-10-CM | POA: Diagnosis not present

## 2019-08-20 DIAGNOSIS — Z79899 Other long term (current) drug therapy: Secondary | ICD-10-CM

## 2019-08-20 DIAGNOSIS — I469 Cardiac arrest, cause unspecified: Secondary | ICD-10-CM | POA: Diagnosis not present

## 2019-08-20 DIAGNOSIS — Z978 Presence of other specified devices: Secondary | ICD-10-CM

## 2019-08-20 DIAGNOSIS — Z8701 Personal history of pneumonia (recurrent): Secondary | ICD-10-CM

## 2019-08-20 DIAGNOSIS — Z452 Encounter for adjustment and management of vascular access device: Secondary | ICD-10-CM

## 2019-08-20 DIAGNOSIS — I5082 Biventricular heart failure: Secondary | ICD-10-CM | POA: Diagnosis not present

## 2019-08-20 DIAGNOSIS — R000 Tachycardia, unspecified: Secondary | ICD-10-CM | POA: Diagnosis not present

## 2019-08-20 DIAGNOSIS — J189 Pneumonia, unspecified organism: Secondary | ICD-10-CM | POA: Diagnosis not present

## 2019-08-20 DIAGNOSIS — J9690 Respiratory failure, unspecified, unspecified whether with hypoxia or hypercapnia: Secondary | ICD-10-CM | POA: Diagnosis not present

## 2019-08-20 DIAGNOSIS — R402212 Coma scale, best verbal response, none, at arrival to emergency department: Secondary | ICD-10-CM | POA: Diagnosis present

## 2019-08-20 DIAGNOSIS — E1122 Type 2 diabetes mellitus with diabetic chronic kidney disease: Secondary | ICD-10-CM | POA: Diagnosis present

## 2019-08-20 DIAGNOSIS — E785 Hyperlipidemia, unspecified: Secondary | ICD-10-CM | POA: Diagnosis present

## 2019-08-20 DIAGNOSIS — I480 Paroxysmal atrial fibrillation: Secondary | ICD-10-CM | POA: Diagnosis present

## 2019-08-20 DIAGNOSIS — E662 Morbid (severe) obesity with alveolar hypoventilation: Secondary | ICD-10-CM | POA: Diagnosis not present

## 2019-08-20 DIAGNOSIS — J9602 Acute respiratory failure with hypercapnia: Secondary | ICD-10-CM | POA: Diagnosis not present

## 2019-08-20 DIAGNOSIS — E876 Hypokalemia: Secondary | ICD-10-CM | POA: Diagnosis not present

## 2019-08-20 DIAGNOSIS — R4020 Unspecified coma: Secondary | ICD-10-CM | POA: Diagnosis not present

## 2019-08-20 DIAGNOSIS — L89329 Pressure ulcer of left buttock, unspecified stage: Secondary | ICD-10-CM | POA: Insufficient documentation

## 2019-08-20 DIAGNOSIS — Z01818 Encounter for other preprocedural examination: Secondary | ICD-10-CM

## 2019-08-20 DIAGNOSIS — Z4682 Encounter for fitting and adjustment of non-vascular catheter: Secondary | ICD-10-CM | POA: Diagnosis not present

## 2019-08-20 DIAGNOSIS — E1165 Type 2 diabetes mellitus with hyperglycemia: Secondary | ICD-10-CM | POA: Diagnosis present

## 2019-08-20 DIAGNOSIS — I428 Other cardiomyopathies: Secondary | ICD-10-CM | POA: Diagnosis present

## 2019-08-20 DIAGNOSIS — N179 Acute kidney failure, unspecified: Secondary | ICD-10-CM | POA: Diagnosis not present

## 2019-08-20 DIAGNOSIS — J9621 Acute and chronic respiratory failure with hypoxia: Secondary | ICD-10-CM | POA: Diagnosis present

## 2019-08-20 DIAGNOSIS — R140 Abdominal distension (gaseous): Secondary | ICD-10-CM | POA: Diagnosis present

## 2019-08-20 DIAGNOSIS — Z794 Long term (current) use of insulin: Secondary | ICD-10-CM

## 2019-08-20 DIAGNOSIS — I498 Other specified cardiac arrhythmias: Secondary | ICD-10-CM | POA: Diagnosis not present

## 2019-08-20 DIAGNOSIS — I2510 Atherosclerotic heart disease of native coronary artery without angina pectoris: Secondary | ICD-10-CM | POA: Diagnosis present

## 2019-08-20 DIAGNOSIS — I6782 Cerebral ischemia: Secondary | ICD-10-CM | POA: Diagnosis not present

## 2019-08-20 DIAGNOSIS — J90 Pleural effusion, not elsewhere classified: Secondary | ICD-10-CM | POA: Diagnosis not present

## 2019-08-20 DIAGNOSIS — I130 Hypertensive heart and chronic kidney disease with heart failure and stage 1 through stage 4 chronic kidney disease, or unspecified chronic kidney disease: Principal | ICD-10-CM | POA: Diagnosis present

## 2019-08-20 DIAGNOSIS — Z9981 Dependence on supplemental oxygen: Secondary | ICD-10-CM

## 2019-08-20 DIAGNOSIS — L8990 Pressure ulcer of unspecified site, unspecified stage: Secondary | ICD-10-CM | POA: Insufficient documentation

## 2019-08-20 DIAGNOSIS — I5043 Acute on chronic combined systolic (congestive) and diastolic (congestive) heart failure: Secondary | ICD-10-CM | POA: Diagnosis present

## 2019-08-20 DIAGNOSIS — I10 Essential (primary) hypertension: Secondary | ICD-10-CM | POA: Diagnosis not present

## 2019-08-20 DIAGNOSIS — J9622 Acute and chronic respiratory failure with hypercapnia: Secondary | ICD-10-CM | POA: Diagnosis present

## 2019-08-20 DIAGNOSIS — I5042 Chronic combined systolic (congestive) and diastolic (congestive) heart failure: Secondary | ICD-10-CM | POA: Diagnosis not present

## 2019-08-20 DIAGNOSIS — R195 Other fecal abnormalities: Secondary | ICD-10-CM | POA: Diagnosis not present

## 2019-08-20 DIAGNOSIS — G9341 Metabolic encephalopathy: Secondary | ICD-10-CM | POA: Diagnosis not present

## 2019-08-20 DIAGNOSIS — R402112 Coma scale, eyes open, never, at arrival to emergency department: Secondary | ICD-10-CM | POA: Diagnosis present

## 2019-08-20 DIAGNOSIS — G4733 Obstructive sleep apnea (adult) (pediatric): Secondary | ICD-10-CM | POA: Diagnosis present

## 2019-08-20 DIAGNOSIS — E1140 Type 2 diabetes mellitus with diabetic neuropathy, unspecified: Secondary | ICD-10-CM | POA: Diagnosis not present

## 2019-08-20 DIAGNOSIS — Z20828 Contact with and (suspected) exposure to other viral communicable diseases: Secondary | ICD-10-CM | POA: Diagnosis present

## 2019-08-20 DIAGNOSIS — J9601 Acute respiratory failure with hypoxia: Secondary | ICD-10-CM

## 2019-08-20 DIAGNOSIS — Z87891 Personal history of nicotine dependence: Secondary | ICD-10-CM

## 2019-08-20 DIAGNOSIS — E870 Hyperosmolality and hypernatremia: Secondary | ICD-10-CM | POA: Diagnosis not present

## 2019-08-20 DIAGNOSIS — J441 Chronic obstructive pulmonary disease with (acute) exacerbation: Secondary | ICD-10-CM | POA: Diagnosis present

## 2019-08-20 DIAGNOSIS — G8929 Other chronic pain: Secondary | ICD-10-CM | POA: Diagnosis not present

## 2019-08-20 DIAGNOSIS — D638 Anemia in other chronic diseases classified elsewhere: Secondary | ICD-10-CM | POA: Diagnosis not present

## 2019-08-20 DIAGNOSIS — Z9911 Dependence on respirator [ventilator] status: Secondary | ICD-10-CM | POA: Diagnosis not present

## 2019-08-20 DIAGNOSIS — E875 Hyperkalemia: Secondary | ICD-10-CM | POA: Diagnosis present

## 2019-08-20 DIAGNOSIS — Z95810 Presence of automatic (implantable) cardiac defibrillator: Secondary | ICD-10-CM

## 2019-08-20 DIAGNOSIS — I2609 Other pulmonary embolism with acute cor pulmonale: Secondary | ICD-10-CM | POA: Diagnosis not present

## 2019-08-20 DIAGNOSIS — Z7951 Long term (current) use of inhaled steroids: Secondary | ICD-10-CM

## 2019-08-20 DIAGNOSIS — I495 Sick sinus syndrome: Secondary | ICD-10-CM | POA: Diagnosis not present

## 2019-08-20 DIAGNOSIS — E669 Obesity, unspecified: Secondary | ICD-10-CM | POA: Diagnosis present

## 2019-08-20 DIAGNOSIS — I5031 Acute diastolic (congestive) heart failure: Secondary | ICD-10-CM | POA: Diagnosis not present

## 2019-08-20 DIAGNOSIS — R109 Unspecified abdominal pain: Secondary | ICD-10-CM

## 2019-08-20 DIAGNOSIS — R402312 Coma scale, best motor response, none, at arrival to emergency department: Secondary | ICD-10-CM | POA: Diagnosis present

## 2019-08-20 DIAGNOSIS — N171 Acute kidney failure with acute cortical necrosis: Secondary | ICD-10-CM | POA: Diagnosis not present

## 2019-08-20 DIAGNOSIS — Z9119 Patient's noncompliance with other medical treatment and regimen: Secondary | ICD-10-CM

## 2019-08-20 DIAGNOSIS — R1110 Vomiting, unspecified: Secondary | ICD-10-CM | POA: Diagnosis not present

## 2019-08-20 DIAGNOSIS — J440 Chronic obstructive pulmonary disease with acute lower respiratory infection: Secondary | ICD-10-CM | POA: Diagnosis not present

## 2019-08-20 DIAGNOSIS — R7989 Other specified abnormal findings of blood chemistry: Secondary | ICD-10-CM | POA: Diagnosis not present

## 2019-08-20 DIAGNOSIS — E7800 Pure hypercholesterolemia, unspecified: Secondary | ICD-10-CM | POA: Diagnosis not present

## 2019-08-20 DIAGNOSIS — T68XXXA Hypothermia, initial encounter: Secondary | ICD-10-CM | POA: Diagnosis present

## 2019-08-20 DIAGNOSIS — L89312 Pressure ulcer of right buttock, stage 2: Secondary | ICD-10-CM | POA: Diagnosis present

## 2019-08-20 DIAGNOSIS — D649 Anemia, unspecified: Secondary | ICD-10-CM | POA: Diagnosis not present

## 2019-08-20 DIAGNOSIS — Z7901 Long term (current) use of anticoagulants: Secondary | ICD-10-CM | POA: Diagnosis not present

## 2019-08-20 DIAGNOSIS — I081 Rheumatic disorders of both mitral and tricuspid valves: Secondary | ICD-10-CM | POA: Diagnosis not present

## 2019-08-20 DIAGNOSIS — I4510 Unspecified right bundle-branch block: Secondary | ICD-10-CM | POA: Diagnosis present

## 2019-08-20 DIAGNOSIS — T502X5A Adverse effect of carbonic-anhydrase inhibitors, benzothiadiazides and other diuretics, initial encounter: Secondary | ICD-10-CM | POA: Diagnosis not present

## 2019-08-20 DIAGNOSIS — I361 Nonrheumatic tricuspid (valve) insufficiency: Secondary | ICD-10-CM | POA: Diagnosis not present

## 2019-08-20 DIAGNOSIS — Z8249 Family history of ischemic heart disease and other diseases of the circulatory system: Secondary | ICD-10-CM

## 2019-08-20 DIAGNOSIS — I872 Venous insufficiency (chronic) (peripheral): Secondary | ICD-10-CM | POA: Diagnosis present

## 2019-08-20 DIAGNOSIS — Z7189 Other specified counseling: Secondary | ICD-10-CM

## 2019-08-20 DIAGNOSIS — G894 Chronic pain syndrome: Secondary | ICD-10-CM | POA: Diagnosis present

## 2019-08-20 DIAGNOSIS — I50810 Right heart failure, unspecified: Secondary | ICD-10-CM | POA: Diagnosis not present

## 2019-08-20 DIAGNOSIS — N184 Chronic kidney disease, stage 4 (severe): Secondary | ICD-10-CM | POA: Diagnosis not present

## 2019-08-20 DIAGNOSIS — M25551 Pain in right hip: Secondary | ICD-10-CM | POA: Diagnosis present

## 2019-08-20 DIAGNOSIS — I25119 Atherosclerotic heart disease of native coronary artery with unspecified angina pectoris: Secondary | ICD-10-CM | POA: Diagnosis not present

## 2019-08-20 DIAGNOSIS — I0981 Rheumatic heart failure: Secondary | ICD-10-CM | POA: Diagnosis not present

## 2019-08-20 DIAGNOSIS — Z8673 Personal history of transient ischemic attack (TIA), and cerebral infarction without residual deficits: Secondary | ICD-10-CM

## 2019-08-20 DIAGNOSIS — L89152 Pressure ulcer of sacral region, stage 2: Secondary | ICD-10-CM | POA: Diagnosis present

## 2019-08-20 DIAGNOSIS — Z6838 Body mass index (BMI) 38.0-38.9, adult: Secondary | ICD-10-CM

## 2019-08-20 DIAGNOSIS — I5033 Acute on chronic diastolic (congestive) heart failure: Secondary | ICD-10-CM | POA: Diagnosis not present

## 2019-08-20 DIAGNOSIS — E873 Alkalosis: Secondary | ICD-10-CM | POA: Diagnosis not present

## 2019-08-20 LAB — SARS CORONAVIRUS 2 BY RT PCR (HOSPITAL ORDER, PERFORMED IN ~~LOC~~ HOSPITAL LAB): SARS Coronavirus 2: NEGATIVE

## 2019-08-20 LAB — COMPREHENSIVE METABOLIC PANEL
ALT: 31 U/L (ref 0–44)
AST: 26 U/L (ref 15–41)
Albumin: 3.1 g/dL — ABNORMAL LOW (ref 3.5–5.0)
Alkaline Phosphatase: 172 U/L — ABNORMAL HIGH (ref 38–126)
Anion gap: 10 (ref 5–15)
BUN: 68 mg/dL — ABNORMAL HIGH (ref 8–23)
CO2: 36 mmol/L — ABNORMAL HIGH (ref 22–32)
Calcium: 8.1 mg/dL — ABNORMAL LOW (ref 8.9–10.3)
Chloride: 90 mmol/L — ABNORMAL LOW (ref 98–111)
Creatinine, Ser: 2.9 mg/dL — ABNORMAL HIGH (ref 0.61–1.24)
GFR calc Af Amer: 24 mL/min — ABNORMAL LOW (ref >60)
GFR calc non Af Amer: 21 mL/min — ABNORMAL LOW (ref >60)
Glucose, Bld: 231 mg/dL — ABNORMAL HIGH (ref 70–99)
Potassium: 5.9 mmol/L — ABNORMAL HIGH (ref 3.5–5.1)
Sodium: 136 mmol/L (ref 135–145)
Total Bilirubin: 0.6 mg/dL (ref 0.3–1.2)
Total Protein: 6.3 g/dL — ABNORMAL LOW (ref 6.5–8.1)

## 2019-08-20 LAB — POCT I-STAT 7, (LYTES, BLD GAS, ICA,H+H)
Acid-Base Excess: 8 mmol/L — ABNORMAL HIGH (ref 0.0–2.0)
Bicarbonate: 36.2 mmol/L — ABNORMAL HIGH (ref 20.0–28.0)
Calcium, Ion: 1.08 mmol/L — ABNORMAL LOW (ref 1.15–1.40)
HCT: 34 % — ABNORMAL LOW (ref 39.0–52.0)
Hemoglobin: 11.6 g/dL — ABNORMAL LOW (ref 13.0–17.0)
O2 Saturation: 97 %
Patient temperature: 97.2
Potassium: 5 mmol/L (ref 3.5–5.1)
Sodium: 132 mmol/L — ABNORMAL LOW (ref 135–145)
TCO2: 38 mmol/L — ABNORMAL HIGH (ref 22–32)
pCO2 arterial: 66.6 mmHg (ref 32.0–48.0)
pH, Arterial: 7.339 — ABNORMAL LOW (ref 7.350–7.450)
pO2, Arterial: 98 mmHg (ref 83.0–108.0)

## 2019-08-20 LAB — BRAIN NATRIURETIC PEPTIDE: B Natriuretic Peptide: 4121.7 pg/mL — ABNORMAL HIGH (ref 0.0–100.0)

## 2019-08-20 LAB — TROPONIN I (HIGH SENSITIVITY): Troponin I (High Sensitivity): 52 ng/L — ABNORMAL HIGH (ref <18)

## 2019-08-20 LAB — MAGNESIUM: Magnesium: 2.5 mg/dL — ABNORMAL HIGH (ref 1.7–2.4)

## 2019-08-20 LAB — LIPASE, BLOOD: Lipase: 22 U/L (ref 11–51)

## 2019-08-20 MED ORDER — HEPARIN SODIUM (PORCINE) 5000 UNIT/ML IJ SOLN
5000.0000 [IU] | Freq: Three times a day (TID) | INTRAMUSCULAR | Status: DC
  Administered 2019-08-21 (×2): 5000 [IU] via SUBCUTANEOUS
  Filled 2019-08-20 (×2): qty 1

## 2019-08-20 MED ORDER — METHYLPREDNISOLONE SODIUM SUCC 125 MG IJ SOLR
125.0000 mg | INTRAMUSCULAR | Status: DC
  Administered 2019-08-21: 125 mg via INTRAVENOUS
  Filled 2019-08-20: qty 2

## 2019-08-20 MED ORDER — ASPIRIN 300 MG RE SUPP
300.0000 mg | RECTAL | Status: AC
  Administered 2019-08-21: 300 mg via RECTAL
  Filled 2019-08-20: qty 1

## 2019-08-20 MED ORDER — SODIUM CHLORIDE 0.9 % IV SOLN
INTRAVENOUS | Status: AC | PRN
  Administered 2019-08-20: 1000 mL via INTRAVENOUS

## 2019-08-20 MED ORDER — SODIUM CHLORIDE 0.9 % IV BOLUS
500.0000 mL | Freq: Once | INTRAVENOUS | Status: AC
  Administered 2019-08-21: 500 mL via INTRAVENOUS

## 2019-08-20 MED ORDER — FENTANYL 2500MCG IN NS 250ML (10MCG/ML) PREMIX INFUSION
0.0000 ug/h | INTRAVENOUS | Status: DC
  Administered 2019-08-21: 100 ug/h via INTRAVENOUS
  Filled 2019-08-20: qty 250

## 2019-08-20 MED ORDER — CALCIUM GLUCONATE-NACL 1-0.675 GM/50ML-% IV SOLN: 1.0000 g | Freq: Once | INTRAVENOUS | Status: DC

## 2019-08-20 MED ORDER — PIPERACILLIN-TAZOBACTAM 3.375 G IVPB 30 MIN
3.3750 g | Freq: Once | INTRAVENOUS | Status: AC
  Administered 2019-08-21: 3.375 g via INTRAVENOUS
  Filled 2019-08-20: qty 50

## 2019-08-20 MED ORDER — INSULIN ASPART 100 UNIT/ML IV SOLN: 5.0000 [IU] | Freq: Once | INTRAVENOUS | Status: DC

## 2019-08-20 MED ORDER — BUDESONIDE 0.25 MG/2ML IN SUSP
0.2500 mg | Freq: Two times a day (BID) | RESPIRATORY_TRACT | Status: DC
  Administered 2019-08-21 (×2): 0.25 mg via RESPIRATORY_TRACT
  Filled 2019-08-20 (×3): qty 2

## 2019-08-20 MED ORDER — VANCOMYCIN HCL IN DEXTROSE 1-5 GM/200ML-% IV SOLN
1000.0000 mg | Freq: Once | INTRAVENOUS | Status: AC
  Administered 2019-08-21: 1000 mg via INTRAVENOUS
  Filled 2019-08-20: qty 200

## 2019-08-20 MED ORDER — ARFORMOTEROL TARTRATE 15 MCG/2ML IN NEBU
15.0000 ug | INHALATION_SOLUTION | Freq: Two times a day (BID) | RESPIRATORY_TRACT | Status: DC
  Administered 2019-08-21 – 2019-08-29 (×18): 15 ug via RESPIRATORY_TRACT
  Filled 2019-08-20 (×20): qty 2

## 2019-08-20 MED ORDER — ETOMIDATE 2 MG/ML IV SOLN
INTRAVENOUS | Status: AC | PRN
  Administered 2019-08-20: 20 mg via INTRAVENOUS

## 2019-08-20 MED ORDER — IPRATROPIUM-ALBUTEROL 0.5-2.5 (3) MG/3ML IN SOLN
3.0000 mL | RESPIRATORY_TRACT | Status: DC
  Administered 2019-08-21: 3 mL via RESPIRATORY_TRACT
  Filled 2019-08-20: qty 3

## 2019-08-20 MED ORDER — ROCURONIUM BROMIDE 50 MG/5ML IV SOLN
INTRAVENOUS | Status: AC | PRN
  Administered 2019-08-20: 50 mg via INTRAVENOUS

## 2019-08-20 MED ORDER — FAMOTIDINE IN NACL 20-0.9 MG/50ML-% IV SOLN
20.0000 mg | Freq: Two times a day (BID) | INTRAVENOUS | Status: DC
  Administered 2019-08-21 – 2019-08-26 (×12): 20 mg via INTRAVENOUS
  Filled 2019-08-20 (×13): qty 50

## 2019-08-20 MED ORDER — NOREPINEPHRINE 4 MG/250ML-% IV SOLN
0.0000 ug/min | INTRAVENOUS | Status: DC
  Administered 2019-08-21: 3 ug/min via INTRAVENOUS
  Filled 2019-08-20: qty 250

## 2019-08-20 MED ORDER — DEXTROSE 50 % IV SOLN: 1.0000 | Freq: Once | INTRAVENOUS | Status: DC

## 2019-08-20 MED ORDER — FUROSEMIDE 10 MG/ML IJ SOLN
120.0000 mg | Freq: Once | INTRAVENOUS | Status: AC
  Administered 2019-08-21: 120 mg via INTRAVENOUS
  Filled 2019-08-20: qty 2

## 2019-08-20 NOTE — ED Triage Notes (Signed)
Pt comes via [facility], witnessed arrest by wife, was not wearing his CPAP tonight, was agonal upon EMS arrival then went unresponsive in PEA, 12 minutes of CPR performed until ROSC and now afib, 1 epi given PTA.

## 2019-08-20 NOTE — ED Provider Notes (Signed)
[facility] EMERGENCY DEPARTMENT Provider Note   CSN: [id number] Arrival date & time: 08/20/19  2159     History   Chief Complaint Chief Complaint  Patient presents with  . Cardiac Arrest    HPI Nicholas Caldwell is a 73 y.o. male.     The history is provided by the EMS personnel. No language interpreter was used.  Cardiac Arrest Witnessed by:  Family member Incident location:  En route to the ED Time since incident:  12 minutes Time before BLS initiated:  Immediate Time before ALS initiated:  Immediate Condition upon EMS arrival:  Agonal respirations Pulse:  Weak Initial cardiac rhythm per EMS:  PEA (when taken to ambulance) Rhythm on admission to ED:  Sinus tachycardia  LVL 5 caveat for cardiac arrest and unresponsiveness   History reviewed. No pertinent past medical history.  There are no active problems to display for this patient.   History reviewed. No pertinent surgical history.      Home Medications    Prior to Admission medications   Not on File    Family History No family history on file.  Social History Social History   Tobacco Use  . Smoking status: Not on file  Substance Use Topics  . Alcohol use: Not on file  . Drug use: Not on file     Allergies   Patient has no known allergies.   Review of Systems Review of Systems  Unable to perform ROS: Intubated     Physical Exam Updated Vital Signs BP 94/60   Pulse (!) 118   Temp (!) 97.2 F (36.2 C) (Temporal)   Resp (!) 23   SpO2 97%   Physical Exam Vitals signs and nursing note reviewed.  Constitutional:      General: He is in acute distress.     Appearance: He is ill-appearing.  HENT:     Mouth/Throat:     Mouth: Mucous membranes are moist.     Pharynx: Oropharynx is clear. No oropharyngeal exudate or posterior oropharyngeal erythema.  Eyes:     Conjunctiva/sclera: Conjunctivae normal.     Pupils: Pupils are equal, round, and reactive to light.   Cardiovascular:     Rate and Rhythm: Tachycardia present.     Pulses: Normal pulses.     Heart sounds: No murmur.  Pulmonary:     Breath sounds: Rhonchi present.  Chest:     Chest wall: No tenderness.  Abdominal:     General: There is distension.     Tenderness: There is no abdominal tenderness.  Musculoskeletal:        General: No tenderness.     Comments: IO in left shoulder.  Skin:    Capillary Refill: Capillary refill takes less than 2 seconds.     Findings: No erythema.  Neurological:     Mental Status: He is unresponsive.     GCS: GCS eye subscore is 1. GCS verbal subscore is 1. GCS motor subscore is 1.      ED Treatments / Results  Labs (all labs ordered are listed, but only abnormal results are displayed) Labs Reviewed  BRAIN NATRIURETIC PEPTIDE - Abnormal; Notable for the following components:      Result Value   B Natriuretic Peptide 4,121.7 (*)    All other components within normal limits  POCT I-STAT 7, (LYTES, BLD GAS, ICA,H+H) - Abnormal; Notable for the following components:   pH, Arterial 7.339 (*)    pCO2 arterial 66.6 (*)  Bicarbonate 36.2 (*)    TCO2 38 (*)    Acid-Base Excess 8.0 (*)    Sodium 132 (*)    Calcium, Ion 1.08 (*)    HCT 34.0 (*)    Hemoglobin 11.6 (*)    All other components within normal limits  SARS CORONAVIRUS 2 BY RT PCR (HOSPITAL ORDER, [facility])  CULTURE, BLOOD (ROUTINE X 2)  CULTURE, BLOOD (ROUTINE X 2)  COMPREHENSIVE METABOLIC PANEL  LIPASE, BLOOD  MAGNESIUM  I-STAT ARTERIAL BLOOD GAS, ED  TROPONIN I (HIGH SENSITIVITY)    EKG EKG Interpretation  Date/Time:  Saturday August 20 2019 22:09:52 EDT Ventricular Rate:  142 PR Interval:    QRS Duration: 140 QT Interval:  332 QTC Calculation: 511 R Axis:   164 Text Interpretation:  Sinus tachycardia Prolonged PR interval LAE, consider biatrial enlargement Nonspecific intraventricular conduction delay Minimal ST depression, inferior leads No  prior ECG for comparison.  No STEMI Confirmed by Antony Blackbird [phone]) on 08/20/2019 10:16:32 PM      Radiology Dg Chest Portable 1 View  Result Date: 08/20/2019 CLINICAL DATA:  73 year old male status post intubation EXAM: PORTABLE CHEST 1 VIEW COMPARISON:  Chest radiograph dated 08/15/2019 FINDINGS: Endotracheal tube with tip approximately 4 cm above the carina. An enteric tube extends below the diaphragm with tip beyond the inferior margin of the image. Interval worsening of bilateral interstitial and airspace density compared to radiograph of 08/15/2019 and development of bilateral pleural effusions, right greater left. Findings most consistent with worsening pulmonary edema or CHF although pneumonia is not excluded. Clinical correlation is recommended. There is no pneumothorax. The cardiac borders are silhouetted. No acute osseous pathology. IMPRESSION: 1. Endotracheal tube above the carina. 2. Significant interval worsening of the bilateral interstitial prominence and airspace opacities and development of bilateral pleural effusions, right greater left. Findings most consistent with worsening pulmonary edema or pneumonia. Clinical correlation and follow-up recommended. Electronically Signed   By: Anner Crete M.D.   On: 08/20/2019 22:44    Procedures Procedure Name: Intubation Date/Time: 08/20/2019 11:02 PM Performed by: Courtney Paris, MD Pre-anesthesia Checklist: Patient identified, Emergency Drugs available, Suction available, Patient being monitored and Timeout performed Induction Type: Rapid sequence Laryngoscope Size: Glidescope Grade View: Grade I Tube size: 7.5 mm Number of attempts: 1 Placement Confirmation: ETT inserted through vocal cords under direct vision,  Positive ETCO2,  CO2 detector and Breath sounds checked- equal and bilateral Secured at: 22 cm Tube secured with: ETT holder Dental Injury: Teeth and Oropharynx as per pre-operative assessment        (including critical care time)  CRITICAL CARE Performed by: Nicholas Caldwell Total critical care time: 50 minutes Critical care time was exclusive of separately billable procedures and treating other patients. Critical care was necessary to treat or prevent imminent or life-threatening deterioration. Critical care was time spent personally by me on the following activities: development of treatment plan with patient and/or surrogate as well as nursing, discussions with consultants, evaluation of patient's response to treatment, examination of patient, obtaining history from patient or surrogate, ordering and performing treatments and interventions, ordering and review of laboratory studies, ordering and review of radiographic studies, pulse oximetry and re-evaluation of patient's condition.   Nicholas Caldwell was evaluated in Emergency Department on 08/20/2019 for the symptoms described in the history of present illness. He was evaluated in the context of the global COVID-19 pandemic, which necessitated consideration that the patient might be at risk for infection with the  SARS-CoV-2 virus that causes COVID-19. Institutional protocols and algorithms that pertain to the evaluation of patients at risk for COVID-19 are in a state of rapid change based on information released by regulatory bodies including the CDC and federal and state organizations. These policies and algorithms were followed during the patient's care in the ED.   Medications Ordered in ED Medications  0.9 %  sodium chloride infusion (1,000 mLs Intravenous New Bag/Given 08/20/19 2210)  etomidate (AMIDATE) injection (20 mg Intravenous Given 08/20/19 2210)  rocuronium (ZEMURON) injection (50 mg Intravenous Given 08/20/19 2211)     Initial Impression / Assessment and Plan / ED Course  I have reviewed the triage vital signs and the nursing notes.  Pertinent labs & imaging results that were available during my care of the patient  were reviewed by me and considered in my medical decision making (see chart for details).        Nicholas Caldwell is a 73 y.o. male with a past medical history significant for CAD, CHF, paroxysmal atrial fibrillation on Eliquis therapy, hypertension, COPD on home oxygen, diabetes, recent pneumonia, and sleep apnea who presents for cardiac arrest.  According to EMS, patient with to bed around 8 PM and then around 9 PM slumped over in the bed with his wife.  Patient was unresponsive.  Patient was found to be agonal he breathing with EMS and after going to the ambulance, patient lost pulses.  Patient had 12 minutes of CPR and 1 dose of epinephrine.  Patient was in PEA arrest with no shockable rhythms.  Patient had a King airway placed in the field and was brought in for further management.  On arrival, patient has sinus rhythm and blood pressure was in the 90s.  Airway was the [facility] airway which was replaced.  RSI intubation occurred without difficulty with etomidate and rocuronium.  Patient initial EKG showed a sinus tachycardia with some abnormalities from prior but no STEMI seen.  Cardiology was called to look at the EKG and agreed that this was not a STEMI.  While getting initial work-up completed, patient bradycardia down into the 20s and appeared to be either a junctional escape rhythm/complete heart block.  This resolved and patient's heart rate is now in the 80s and 90s.  Patient's blood pressure has improved with fluids.  Critical care was called to come see patient and cardiology be called for possible symptomatic bradycardia causing cardiac arrest.  Anticipate admission to critical care service.   Final Clinical Impressions(s) / ED Diagnoses   Final diagnoses:  Cardiac arrest ([place])     Clinical Impression: 1. Cardiac arrest [facility]     Disposition: Admit  This note was prepared with assistance of Dragon voice recognition software. Occasional wrong-word or sound-a-like substitutions may  have occurred due to the inherent limitations of voice recognition software.     Caldwell, Nicholas Allegra, MD 08/20/19 [phone]

## 2019-08-20 NOTE — Consult Note (Signed)
Cardiology Consultation:   Patient ID: Nicholas Caldwell MRN: [id number]; DOB: [birth year]  Admit date: 08/20/2019 Date of Consult: 08/21/2019  Primary Care Provider: [name], MD Primary Cardiologist: Dr [name]    Patient Profile:   Nicholas Caldwell is a 73 y.o. male with a history of chronic systolic CHFdue to NICM (EF 40-45%)with moderate RV failure, COPD on 3 L O2, DM, HTN, OHS/OSA on CPAP, PAF s/p DCCV, hyperlipidemia, CAD, and noncompliance. He is brought to the ED tonight after witnessed PEA arrest at home. Cardiology is consulted for bradycardia following cardiac arrest.  History of Present Illness:   Nicholas Caldwell is a 73 y.o. male with a history of chronic systolic CHFdue to NICM (EF 40-45%)with moderate RV failure, COPD on 3 L O2, DM, HTN, OHS/OSA on CPAP, PAF s/p DCCV, hyperlipidemia, CAD, and noncompliance. He is brought to the ED tonight after witnessed PEA arrest at home. Cardiology is consulted for bradycardia following cardiac arrest.  The patient has had multiple admissions due to HF and respiratory failure. He was most recently hospitalized 10/6/-08/15/19 with RLL PNA (treated with ceftriaxone), CHF exacerbation, and right pleural effusion.  He had right-sided thoracentesis.   He follows in cardiology clinic with [name]. He was last seen in clinic on 10/15, at which time he reported symptomatic improvement since his hospitalization, although he endorsed ongoing dyspnea with moderate exertion. At that visit, his torsemide and Bidil doses were increased.   He was brought to the ED after witnessed arrest at home. Per report from ED, the patient was sitting with his wife, when she observed him to become non-responsive with agonal breathing. He reportedly had a higher than normal dose of his home oxycodone. EMS was called and found the patient to have a pulse, although he was subsequently observed to lose a pulse. ACLS was started for PEA arrest, and ROSC was achieved after  12 minutes CPR with 1 mg epinephrine. A King airway was placed in the field.   He was brought to the ED where he was intubated. ECG showed tachycardia in the 140s (possibly fib/flutter or AT vs ST) with RBBB-type IVCD and nonspecific ST changes. Dr. Martinique was contacted by the ED who believed ECG to not be consistent with STEMI. While in the ED, the patient was noted to transition suddenly from this regular tachycardia to junctional bradycardia in the 20s which subsequently transitioned to normal sinus rhythm after ~ 1 minute. Repeat ECG showed sinus rhythm with incomplete RBBB and nonspecific ST changes, similar to prior baseline ECGs. Workup otherwise is pending. He is being admitted by the MICU team. Cardiology is consulted for further recommendations.   On my evaluation, the patient is intubated. He inconsistently opens his eyes on command but is otherwise non-intentional. He inconsistently winces with painful stimuli.    PMH (per Dr. Claris Gladden note): 1. Chronic systolic CHF: Nonischemic cardiomyopathy, diagnosed in 2016.  2. Atrial fibrillation: Paroxysmal. Severe LAE, has seen Dr Rayann Heman and thought to have low chance of success with atrial fibrillation ablation. Tikosyn not used due to long QT. He was on amiodarone in the past but this was stopped due to long QT. - DCCV 3/20, 5/20. 3. COPD: On 3 L home oxygen.  4. Type II diabetes 5. Hyperlipidemia 6. HTN 7.OHS/OSA:Home oxygen + CPAP.  8. CAD: LHC (2/20) with 50-60% distal LAD, 50% proximal LCx, 50% proximal RCA (nondominant RCA). 9. CVA: 5/20.  10. CKD: Stage 3.  11. ABIs (9/20): normal.  Current Outpatient Medications  Medication Sig Dispense Refill  . albuterol (VENTOLIN HFA) 108 (90 Base) MCG/ACT inhaler Inhale 2 puffs into the lungs every 6 (six) hours as needed for wheezing or shortness of breath.    Marland Kitchen apixaban (ELIQUIS) 5 MG TABS tablet Take 1 tablet (5 mg total) by mouth 2 (two) times daily. 60 tablet 0  .  bisoprolol (ZEBETA) 5 MG tablet Take 5 mg by mouth at bedtime.    . budesonide-formoterol (SYMBICORT) 160-4.5 MCG/ACT inhaler Inhale 2 puffs into the lungs as needed (shortness of breath).     . ferrous sulfate 325 (65 FE) MG tablet Take 325 mg by mouth 2 (two) times daily.    . fluticasone (FLONASE) 50 MCG/ACT nasal spray Place 2 sprays into both nostrils daily as needed for allergies.     Marland Kitchen gabapentin (NEURONTIN) 300 MG capsule Take 1 capsule (300 mg total) by mouth 2 (two) times daily. 60 capsule 0  . hydrocortisone (ANUSOL-HC) 2.5 % rectal cream Place 1 application rectally 2 (two) times daily as needed for hemorrhoids or itching.     . hydrocortisone 1 % lotion Apply 1 application topically See admin instructions. Apply small amount to back twice daily for itchy rash as needed    . insulin aspart (NOVOLOG) 100 UNIT/ML injection Inject 4 Units into the skin 3 (three) times daily with meals. 10 mL 0  . insulin aspart protamine - aspart (NOVOLOG MIX 70/30 FLEXPEN) (70-30) 100 UNIT/ML FlexPen Inject 0.1 mLs (10 Units total) into the skin 2 (two) times daily. 15 mL 0  . ipratropium-albuterol (DUONEB) 0.5-2.5 (3) MG/3ML SOLN Take 3 mLs by nebulization every 6 (six) hours as needed (shortness of breath/wheezing). 360 mL 0  . isosorbide-hydrALAZINE (BIDIL) 20-37.5 MG tablet Take 1 tablet by mouth 3 (three) times daily. 270 tablet 3  . metolazone (ZAROXOLYN) 2.5 MG tablet Take 2.5 mg by mouth 4 (four) times a week. Take extra 68mq of potassium when taking this medication    . oxybutynin (DITROPAN) 5 MG tablet Take 1 tablet (5 mg total) by mouth 4 (four) times daily. 120 tablet 0  . oxyCODONE-acetaminophen (PERCOCET) 10-325 MG tablet Take 1 tablet by mouth every 6 (six) hours as needed for pain.     . OXYGEN Inhale 3 L into the lungs continuous.     . pantoprazole (PROTONIX) 40 MG tablet Take 1 tablet (40 mg total) by mouth 2 (two) times daily. 60 tablet 0  . polyethylene glycol  (MIRALAX / GLYCOLAX) packet Take 17 g by mouth daily. 14 each 0  . potassium chloride SA (K-DUR) 20 MEQ tablet Take 3 tablets (60 mEq total) by mouth 2 (two) times daily. With additional 276m when you take Metolazone. 90 tablet 3  . PRESCRIPTION MEDICATION Cpap    . rosuvastatin (CRESTOR) 20 MG tablet Take 1 tablet (20 mg total) by mouth daily at 6 PM. 90 tablet 3  . sodium chloride (OCEAN) 0.65 % SOLN nasal spray Place 2 sprays into both nostrils as needed for congestion.     . tamsulosin (FLOMAX) 0.4 MG CAPS capsule Take 1 capsule (0.4 mg total) by mouth daily after supper. 30 capsule 3  . torsemide (DEMADEX) 20 MG tablet Take 5 tablets (100 mg total) by mouth 2 (two) times daily. 150 tablet 3   No current facility-administered medications for this encounter.     PRN Meds:   Allergies:   Patient has no known allergies.   Social History (Per Dr. McClaris Gladdenote):  The patient  reports that he quit smoking about 8 months ago. His smoking use included cigarettes. He has a 34.00 pack-year smoking history. He has never used smokeless tobacco. He reports that he does not drink alcohol or use drugs.   Family History:  The patient's family history includes Heart disease in his father and mother; Heart failure in his mother; Hypertension in his mother and sister.    ROS:  Unable to complete ROS as pt is intubated and sedated   Physical Exam/Data:   Vitals:   08/21/19 0000 08/21/19 0005 08/21/19 0030 08/21/19 0032  BP: 126/78     Pulse: 72  66   Resp: 18     Temp: (!) 97.5 F (36.4 C)  97.8 F (36.6 C)   TempSrc:      SpO2: 100% 100% 100%   Weight:    122.5 kg  Height:    _0  (1.778 m)    Intake/Output Summary (Last 24 hours) at 08/21/2019 0055 Last data filed at 08/21/2019 0031 Gross per 24 hour  Intake 500 ml  Output -  Net 500 ml   Last 3 Weights 08/21/2019  Weight (lbs) 270 lb  Weight (kg) 122.471 kg     Body mass index is 38.74 kg/m.  General:   Chronically ill appearing. Intubated HEENT: ETT in place  Neck: JVD difficult to assess Cardiac:  RRR. No significant murmur  Lungs:  Mechanically ventilated with upper airway rattle with bilateral qheezing  Abd: distended and firm Musculoskeletal:  No deformities, BUE and BLE strength normal and equal Extr: Legs wrapped. 2+ pitting edema bilaterally  Neuro:  Does not follow commands. Inconsistently opens eyes with shouting. Inconsistently winces but does not withdraw from pain.  Psych:  Normal affect   EKG (most recent):  The EKG was personally reviewed and demonstrates:  Sinus rhythm with incomplete RBBB. Nonspecific ST changes Telemetry:  Telemetry was personally reviewed and demonstrates:  Tachycardia with HR 140s with slowing and transition to junctional bradycardia with some AV dissociation that later transitions to sinus rhythm   Relevant CV Studies: - LHC (1/16) with mild nonobstructive CAD.  - Echo (12/17): EF 35-40%, mildly dilated RV with mild to moderately decreased RV systolic function, PASP 57 mmHg. - Echo (3/20): EF 45-50%, mild LVH, D-shaped septum with severe RV dilation and RV function.  - TEE (3/20): EF 40-45%, moderate LVH, severe RV dilation with moderate dysfunction.  - RHC (2/20): mean RA 10, PA 52/16 mean 30, mean PCWP 10, CI 3.85, PVR 2.2 WU - Echo (9/20): EF >65%, moderate RV dilation with mildly decreased systolic function.   Laboratory Data:  High Sensitivity Troponin:   Recent Labs  Lab 08/20/19 2210  TROPONINIHS 52*     Chemistry Recent Labs  Lab 08/20/19 2210 08/20/19 2253  NA 136 132*  K 5.9* 5.0  CL 90*  --   CO2 36*  --   GLUCOSE 231*  --   BUN 68*  --   CREATININE 2.90*  --   CALCIUM 8.1*  --   GFRNONAA 21*  --   GFRAA 24*  --   ANIONGAP 10  --     Recent Labs  Lab 08/20/19 2210  PROT 6.3*  ALBUMIN 3.1*  AST 26  ALT 31  ALKPHOS 172*  BILITOT 0.6   Hematology Recent Labs  Lab 08/20/19 2253 08/21/19 0017  WBC  --  8.3   RBC  --  3.83*  HGB 11.6* 9.9*  HCT 34.0*  35.8*  MCV  --  93.5  MCH  --  25.8*  MCHC  --  27.7*  RDW  --  20.5*  PLT  --  356   BNP Recent Labs  Lab 08/20/19 2210  BNP 4,121.7*    DDimer No results for input(s): DDIMER in the last 168 hours.   Radiology/Studies:  Dg Chest Portable 1 View  Result Date: 08/20/2019 CLINICAL DATA:  73 year old male status post intubation EXAM: PORTABLE CHEST 1 VIEW COMPARISON:  Chest radiograph dated 08/15/2019 FINDINGS: Endotracheal tube with tip approximately 4 cm above the carina. An enteric tube extends below the diaphragm with tip beyond the inferior margin of the image. Interval worsening of bilateral interstitial and airspace density compared to radiograph of 08/15/2019 and development of bilateral pleural effusions, right greater left. Findings most consistent with worsening pulmonary edema or CHF although pneumonia is not excluded. Clinical correlation is recommended. There is no pneumothorax. The cardiac borders are silhouetted. No acute osseous pathology. IMPRESSION: 1. Endotracheal tube above the carina. 2. Significant interval worsening of the bilateral interstitial prominence and airspace opacities and development of bilateral pleural effusions, right greater left. Findings most consistent with worsening pulmonary edema or pneumonia. Clinical correlation and follow-up recommended. Electronically Signed   By: Anner Crete M.D.   On: 08/20/2019 22:44    Assessment and Plan:   S/P PEA Arrest The patient is hospitalized after a witnessed PEA arrest at home. The cause of his arrest is not clear but is suspected to be due to possible primary respiratory etiology in the setting of his severe pulmonary disease and report of increased opiate use this evening. Primary ACS event is unlikely, as he did not have any preceding chest pain and ECG shows no signs of acute ischemia. Labs are currently pending. He remains hemodynamically stable following  resuscitation, although mental status remains significantly depressed. Plan of care discussed with MICU team, and at this time, TTM will be initiated. Otherwise, the patient will be treated for COPD exacerbation with the addition of empiric antibiotics.  -Appreciate management per MICU team. Agree with TTM (goal 36 degrees), treatment of suspected respiratory trigger -Agree with repeat echocardiogram   Tachycardia Bradycardia On arrival to the ED, pt was noted to be in tachycardia in the 140s with rhythm that is uncertain but possibly due to fib/flutter vs AT. He was then noted to suddenly transition into a slow junctional bradycardia that later transitioned into sinus rhythm. The cause of these transitions in rhythm are not clear, but suspect bradycardia to be due to slow sinus node recovery after termination of tachyarrhythmia. He is currently in sinus rhythm and remains hemodynamically stable. -Continue to monitor on telemetry -Would hold nodal blockade for now. Will need close monitoring of rate trends due to suggestion of tachy-brady syndrome   HFrEFwith improved LVEF RV failure 2/2 COPD/OSA/OHS The patient has frequent hypervolemia with multiple recent HF hospitalizations. He is followed closely in HF clinic and recently had an increase in his torsemide to 100 mg BID in addition to metolazone 4 times/week. He appears moderately hypervolemic on exam and will likely require ongoing diuresis. -Agree with IV furosemide per primary team. Continue to monitor UOP and volume status and re-dose diuretic as appropriate. -Hold BB as above   Paroxysmal atrial fibrillation:  As above, suspect presenting rhythm may be due to fib/flutter.  -On apixaban at home. Recommend ongoing anticoagulation per primary team.   Nonobstructive CAD:  -Continue home atorvastatin  For questions or updates, please contact [place] Please consult [email or web address] for contact info under     Signed, Nila Nephew, MD  08/21/2019 12:55 AM

## 2019-08-20 NOTE — ED Notes (Signed)
Pt HR brady down to the 20's, checked for pulse, faint pulse noted, pt HR then returned to NSR

## 2019-08-20 NOTE — ED Notes (Signed)
Ice packs applied to patient for cooling

## 2019-08-20 NOTE — H&P (Signed)
NAME:  Nicholas Caldwell, MRN:  [id number], DOB:  12-12-1945, LOS: 0 ADMISSION DATE:  08/20/2019, CONSULTATION DATE:  08/21/19 REFERRING MD:  Audelia Hives , CHIEF COMPLAINT:  NA  Brief History   Patient with history of pAF, HFrEF and COPD presents sp cardiac arrest.  History of present illness   Nicholas Caldwell is a 73 y.o. M with HTN, pAF on Eliquis, ACD, sCHF EF 40-45%, DM with neuropathy, WC bound, COPD on 3L O2, OSA who presneted  Sp c ardiac arrest. Was fiound by wife who notes that patient was sleeping like norm al this evening. Not wearing bipap. He took his medicine and pain meds this evening. Wife tried to wake him, but he would not wake up. He would go right back to sleep. After several attempt to awake patient she called EMS. EMS went Noted patient to be agonal. Than patient went into PEA. Got compressions for 12 minutes. Giot 1 of epi. Also intubated for AMS.   Wife also noted Sugar was up to over 400. Ddid not have any cough SOB, Fevers or SOB. Has been taking his medicines as prescribed. Has beden peeing a fair amount has gained about 4 lbs. He did take some pain pills tonight   Of note patient recently admitted from 10/6-10/1 2Noted to have COPD exacerbation,pna,  and heart failure exacerbation  Past Medical History  HTN pAF ACD HfrEF COPD OSA  Significant Hospital Events   Intubated 10/17  Consults:  Cards consulted in ED  Procedures:  NA  Significant Diagnostic Tests:   Patient with CXR notable for gross volume overload Micro Data:  [place] and Coronavirus pending  Antimicrobials:  Will start vanc an zosyn  Interim history/subjective:  NA  Objective   Blood pressure (!) 140/103, pulse 76, temperature (!) 97.2 F (36.2 C), temperature source Temporal, resp. rate 18, SpO2 100 %.    Vent Mode: PRVC FiO2 (%):  [70 %-100 %] 70 % Set Rate:  [18 bmp] 18 bmp Vt Set:  [550 mL] 550 mL PEEP:  [5 cmH20] 5 cmH20 Plateau Pressure:  [39 cmH20-42 cmH20] 39 cmH20  No intake or  output data in the 24 hours ending 08/20/19 2302 There were no vitals filed for this visit.  Examination: General: Patient intubated and sedated HENT: ET tube in mouth no blood secretions noted Lungs: Diffuse expiratory wheezing Cardiovascular: Regular with no mumrmurs noted Abdomen: distended but not tense Extremities: Compression socks noted. 2+ LE edema noted Neuro: Sedated unable to follow commands GU: No abnormalities noted   Resolved Hospital Problem list   NA  Assessment & Plan:  This is a 73 yo with history as noted above who presents for chief complaint of PEA arrest   PEA arrest-SP intubation. This is primary respiratory as patient not weaning bipap. Has signs of COPD exacerbation and appears grossly overloaded. EKG and trops not suggestive of primary cardiac -Will cool patient to 36 degrees -FU cards consult -Echo to be attained in AM  Acute on chronic HFrEF-With dilated IVC on POCUS -Diurese with 120 of lasix now  -Redose as clinically indicarted  AKI on CKD4- -Continue to monitor creatinine -Avoid nephrotoxins  COPD exacerbation-Patient with bilateral wheezes -Solumedrol for now -Scheduled duonebs, formoterol, and pulmicrot  PAF -On Eliquis at home.  -Will attain head CT before starting [facility] plan to start hepo drip -Holding beta blocker for nowgiven patient had 1 minute of bradycardia in ED   DM 2 -Patientr on 20 of 70/30 at home with mealtime -5  of lantus with sensitivty while here. Will likely need to increase as day goes one      Best practice:  Diet:  NPO for now Pain/Anxiety/Delirium protocol (if indicated): Fentanyl drip VAP protocol (if indicated): HOB 30 degrees DVT prophylaxis: Heparin drip if head ct negative GI prophylaxis: Pepcid BID Glucose control: Lantus and sensitivity Mobility: NA Code Status: Full code with wife established on admission Family Communication: Wife updated on admission Disposition: ICU for now  Labs   CBC:  Recent Labs  Lab 08/20/19 2253  HGB 11.6*  HCT 34.0*    Basic Metabolic Panel: Recent Labs  Lab 08/20/19 2253  NA 132*  K 5.0   GFR: CrCl cannot be calculated (No successful lab value found.). No results for input(s): PROCALCITON, WBC, LATICACIDVEN in the last 168 hours.  Liver Function Tests: No results for input(s): AST, ALT, ALKPHOS, BILITOT, PROT, ALBUMIN in the last 168 hours. No results for input(s): LIPASE, AMYLASE in the last 168 hours. No results for input(s): AMMONIA in the last 168 hours.  ABG    Component Value Date/Time   PHART 7.339 (L) 08/20/2019 2253   PCO2ART 66.6 (HH) 08/20/2019 2253   PO2ART 98.0 08/20/2019 2253   HCO3 36.2 (H) 08/20/2019 2253   TCO2 38 (H) 08/20/2019 2253   O2SAT 97.0 08/20/2019 2253     Coagulation Profile: No results for input(s): INR, PROTIME in the last 168 hours.  Cardiac Enzymes: No results for input(s): CKTOTAL, CKMB, CKMBINDEX, TROPONINI in the last 168 hours.  HbA1C: No results found for: HGBA1C  CBG: No results for input(s): GLUCAP in the last 168 hours.  Review of Systems:   Pertinent positives and negativ es per HPI  Past Medical History  He,  has no past medical history on file.   Surgical History   History reviewed. No pertinent surgical history.   Social History      Family History   His family history is not on file.   Allergies No Known Allergies   Home Medications  Prior to Admission medications   Not on File     Critical care time: 1 hour

## 2019-08-21 ENCOUNTER — Inpatient Hospital Stay (HOSPITAL_COMMUNITY): Payer: Medicare Other

## 2019-08-21 DIAGNOSIS — L89329 Pressure ulcer of left buttock, unspecified stage: Secondary | ICD-10-CM | POA: Insufficient documentation

## 2019-08-21 DIAGNOSIS — I495 Sick sinus syndrome: Secondary | ICD-10-CM | POA: Diagnosis not present

## 2019-08-21 DIAGNOSIS — I498 Other specified cardiac arrhythmias: Secondary | ICD-10-CM

## 2019-08-21 DIAGNOSIS — L8990 Pressure ulcer of unspecified site, unspecified stage: Secondary | ICD-10-CM | POA: Insufficient documentation

## 2019-08-21 DIAGNOSIS — I361 Nonrheumatic tricuspid (valve) insufficiency: Secondary | ICD-10-CM

## 2019-08-21 DIAGNOSIS — I469 Cardiac arrest, cause unspecified: Secondary | ICD-10-CM | POA: Diagnosis not present

## 2019-08-21 LAB — GLUCOSE, CAPILLARY
Glucose-Capillary: 144 mg/dL — ABNORMAL HIGH (ref 70–99)
Glucose-Capillary: 170 mg/dL — ABNORMAL HIGH (ref 70–99)
Glucose-Capillary: 194 mg/dL — ABNORMAL HIGH (ref 70–99)
Glucose-Capillary: 194 mg/dL — ABNORMAL HIGH (ref 70–99)
Glucose-Capillary: 206 mg/dL — ABNORMAL HIGH (ref 70–99)
Glucose-Capillary: 233 mg/dL — ABNORMAL HIGH (ref 70–99)
Glucose-Capillary: 76 mg/dL (ref 70–99)

## 2019-08-21 LAB — APTT
aPTT: 122 seconds — ABNORMAL HIGH (ref 24–36)
aPTT: 141 seconds — ABNORMAL HIGH (ref 24–36)
aPTT: 29 seconds (ref 24–36)
aPTT: 72 seconds — ABNORMAL HIGH (ref 24–36)

## 2019-08-21 LAB — BASIC METABOLIC PANEL
Anion gap: 14 (ref 5–15)
Anion gap: 15 (ref 5–15)
BUN: 69 mg/dL — ABNORMAL HIGH (ref 8–23)
BUN: 67 mg/dL — ABNORMAL HIGH (ref 8–23)
CO2: 31 mmol/L (ref 22–32)
CO2: 32 mmol/L (ref 22–32)
Calcium: 8.3 mg/dL — ABNORMAL LOW (ref 8.9–10.3)
Calcium: 8.4 mg/dL — ABNORMAL LOW (ref 8.9–10.3)
Chloride: 90 mmol/L — ABNORMAL LOW (ref 98–111)
Chloride: 89 mmol/L — ABNORMAL LOW (ref 98–111)
Creatinine, Ser: 2.75 mg/dL — ABNORMAL HIGH (ref 0.61–1.24)
Creatinine, Ser: 2.53 mg/dL — ABNORMAL HIGH (ref 0.61–1.24)
GFR calc Af Amer: 26 mL/min — ABNORMAL LOW (ref >60)
GFR calc Af Amer: 28 mL/min — ABNORMAL LOW (ref >60)
GFR calc non Af Amer: 22 mL/min — ABNORMAL LOW (ref >60)
GFR calc non Af Amer: 25 mL/min — ABNORMAL LOW (ref >60)
Glucose, Bld: 194 mg/dL — ABNORMAL HIGH (ref 70–99)
Glucose, Bld: 198 mg/dL — ABNORMAL HIGH (ref 70–99)
Potassium: 6 mmol/L — ABNORMAL HIGH (ref 3.5–5.1)
Potassium: 4.5 mmol/L (ref 3.5–5.1)
Sodium: 135 mmol/L (ref 135–145)
Sodium: 136 mmol/L (ref 135–145)

## 2019-08-21 LAB — CBC
HCT: 33.9 % — ABNORMAL LOW (ref 39.0–52.0)
HCT: 35.8 % — ABNORMAL LOW (ref 39.0–52.0)
Hemoglobin: 9.8 g/dL — ABNORMAL LOW (ref 13.0–17.0)
Hemoglobin: 9.9 g/dL — ABNORMAL LOW (ref 13.0–17.0)
MCH: 25.8 pg — ABNORMAL LOW (ref 26.0–34.0)
MCH: 26.6 pg (ref 26.0–34.0)
MCHC: 27.7 g/dL — ABNORMAL LOW (ref 30.0–36.0)
MCHC: 28.9 g/dL — ABNORMAL LOW (ref 30.0–36.0)
MCV: 91.9 fL (ref 80.0–100.0)
MCV: 93.5 fL (ref 80.0–100.0)
Platelets: 348 10*3/uL (ref 150–400)
Platelets: 356 10*3/uL (ref 150–400)
RBC: 3.69 MIL/uL — ABNORMAL LOW (ref 4.22–5.81)
RBC: 3.83 MIL/uL — ABNORMAL LOW (ref 4.22–5.81)
RDW: 20.4 % — ABNORMAL HIGH (ref 11.5–15.5)
RDW: 20.5 % — ABNORMAL HIGH (ref 11.5–15.5)
WBC: 8.3 10*3/uL (ref 4.0–10.5)
WBC: 8.4 10*3/uL (ref 4.0–10.5)
nRBC: 0.2 % (ref 0.0–0.2)
nRBC: 0.4 % — ABNORMAL HIGH (ref 0.0–0.2)

## 2019-08-21 LAB — PROTIME-INR
INR: 1.4 — ABNORMAL HIGH (ref 0.8–1.2)
INR: 1.5 — ABNORMAL HIGH (ref 0.8–1.2)
INR: 1.6 — ABNORMAL HIGH (ref 0.8–1.2)
Prothrombin Time: 17.3 seconds — ABNORMAL HIGH (ref 11.4–15.2)
Prothrombin Time: 18.1 seconds — ABNORMAL HIGH (ref 11.4–15.2)
Prothrombin Time: 18.6 seconds — ABNORMAL HIGH (ref 11.4–15.2)

## 2019-08-21 LAB — PHOSPHORUS: Phosphorus: 5.2 mg/dL — ABNORMAL HIGH (ref 2.5–4.6)

## 2019-08-21 LAB — MRSA PCR SCREENING: MRSA by PCR: NEGATIVE

## 2019-08-21 LAB — MAGNESIUM: Magnesium: 2.4 mg/dL (ref 1.7–2.4)

## 2019-08-21 LAB — PROCALCITONIN: Procalcitonin: 0.74 ng/mL

## 2019-08-21 LAB — SODIUM, URINE, RANDOM: Sodium, Ur: 42 mmol/L

## 2019-08-21 LAB — TROPONIN I (HIGH SENSITIVITY)
Troponin I (High Sensitivity): 75 ng/L — ABNORMAL HIGH (ref <18)
Troponin I (High Sensitivity): 127 ng/L (ref <18)

## 2019-08-21 LAB — ECHOCARDIOGRAM COMPLETE
Height: 71 in
Weight: 4380.98 oz

## 2019-08-21 LAB — CREATININE, URINE, RANDOM: Creatinine, Urine: 39.16 mg/dL

## 2019-08-21 LAB — HEPARIN LEVEL (UNFRACTIONATED): Heparin Unfractionated: >2.2 IU/mL — ABNORMAL HIGH (ref 0.30–0.70)

## 2019-08-21 LAB — CBG MONITORING, ED: Glucose-Capillary: 184 mg/dL — ABNORMAL HIGH (ref 70–99)

## 2019-08-21 MED ORDER — SODIUM CHLORIDE 0.9 % IV SOLN
INTRAVENOUS | Status: DC | PRN
  Administered 2019-08-22 – 2019-08-24 (×2): via INTRAVENOUS

## 2019-08-21 MED ORDER — OXYCODONE-ACETAMINOPHEN 5-325 MG PO TABS
1.0000 | ORAL_TABLET | Freq: Four times a day (QID) | ORAL | Status: DC | PRN
  Administered 2019-08-21 – 2019-08-23 (×4): 1 via ORAL
  Filled 2019-08-21 (×4): qty 1

## 2019-08-21 MED ORDER — VANCOMYCIN HCL 10 G IV SOLR
1500.0000 mg | INTRAVENOUS | Status: DC
  Administered 2019-08-21: 02:00:00 1500 mg via INTRAVENOUS
  Filled 2019-08-21 (×3): qty 1500

## 2019-08-21 MED ORDER — SODIUM CHLORIDE 0.9% FLUSH
10.0000 mL | Freq: Two times a day (BID) | INTRAVENOUS | Status: DC
  Administered 2019-08-21 – 2019-08-23 (×5): 10 mL
  Administered 2019-08-24: 20 mL
  Administered 2019-08-24 – 2019-08-26 (×4): 10 mL
  Administered 2019-08-26: 30 mL
  Administered 2019-08-27 – 2019-08-28 (×3): 10 mL

## 2019-08-21 MED ORDER — ACETAMINOPHEN 325 MG PO TABS: 650.0000 mg | ORAL_TABLET | Freq: Four times a day (QID) | ORAL | Status: DC | PRN

## 2019-08-21 MED ORDER — PIPERACILLIN-TAZOBACTAM 3.375 G IVPB
3.3750 g | Freq: Three times a day (TID) | INTRAVENOUS | Status: DC
  Administered 2019-08-21 – 2019-08-25 (×12): 3.375 g via INTRAVENOUS
  Filled 2019-08-21 (×15): qty 50

## 2019-08-21 MED ORDER — IPRATROPIUM-ALBUTEROL 0.5-2.5 (3) MG/3ML IN SOLN
3.0000 mL | Freq: Two times a day (BID) | RESPIRATORY_TRACT | Status: DC
  Administered 2019-08-21: 08:00:00 3 mL via RESPIRATORY_TRACT
  Filled 2019-08-21: qty 3

## 2019-08-21 MED ORDER — SODIUM CHLORIDE 0.9% FLUSH
10.0000 mL | INTRAVENOUS | Status: DC | PRN
  Administered 2019-08-21: 03:00:00 10 mL
  Filled 2019-08-21: qty 40

## 2019-08-21 MED ORDER — "THROMBI-PAD 3""X3"" EX PADS"
1.0000 | MEDICATED_PAD | Freq: Once | CUTANEOUS | Status: AC
  Administered 2019-08-21: 1 via TOPICAL
  Filled 2019-08-21: qty 1

## 2019-08-21 MED ORDER — GABAPENTIN 600 MG PO TABS
300.0000 mg | ORAL_TABLET | Freq: Two times a day (BID) | ORAL | Status: DC
  Administered 2019-08-21 – 2019-08-23 (×5): 300 mg via ORAL
  Filled 2019-08-21 (×5): qty 1

## 2019-08-21 MED ORDER — IBUPROFEN 200 MG PO TABS: 600.0000 mg | ORAL_TABLET | Freq: Four times a day (QID) | ORAL | Status: DC | PRN

## 2019-08-21 MED ORDER — ORAL CARE MOUTH RINSE
15.0000 mL | OROMUCOSAL | Status: DC
  Administered 2019-08-21 (×5): 15 mL via OROMUCOSAL

## 2019-08-21 MED ORDER — IPRATROPIUM-ALBUTEROL 0.5-2.5 (3) MG/3ML IN SOLN
3.0000 mL | Freq: Three times a day (TID) | RESPIRATORY_TRACT | Status: DC
  Administered 2019-08-22 (×3): 3 mL via RESPIRATORY_TRACT
  Filled 2019-08-21 (×3): qty 3

## 2019-08-21 MED ORDER — FENTANYL CITRATE (PF) 100 MCG/2ML IJ SOLN: 12.5000 ug | INTRAMUSCULAR | Status: DC | PRN

## 2019-08-21 MED ORDER — CHLORHEXIDINE GLUCONATE CLOTH 2 % EX PADS
6.0000 | MEDICATED_PAD | Freq: Every day | CUTANEOUS | Status: DC
  Administered 2019-08-23 – 2019-08-29 (×7): 6 via TOPICAL

## 2019-08-21 MED ORDER — FUROSEMIDE 10 MG/ML IJ SOLN
40.0000 mg | Freq: Two times a day (BID) | INTRAMUSCULAR | Status: DC
  Administered 2019-08-21 – 2019-08-22 (×2): 40 mg via INTRAVENOUS
  Filled 2019-08-21 (×2): qty 4

## 2019-08-21 MED ORDER — IPRATROPIUM-ALBUTEROL 0.5-2.5 (3) MG/3ML IN SOLN
3.0000 mL | Freq: Three times a day (TID) | RESPIRATORY_TRACT | Status: DC
  Administered 2019-08-21 (×2): 3 mL via RESPIRATORY_TRACT
  Filled 2019-08-21 (×2): qty 3

## 2019-08-21 MED ORDER — OXYCODONE-ACETAMINOPHEN 5-325 MG PO TABS
1.0000 | ORAL_TABLET | Freq: Four times a day (QID) | ORAL | Status: DC | PRN
  Administered 2019-08-21: 14:00:00 1 via ORAL
  Filled 2019-08-21: qty 1

## 2019-08-21 MED ORDER — HEPARIN (PORCINE) 25000 UT/250ML-% IV SOLN
900.0000 [IU]/h | INTRAVENOUS | Status: DC
  Administered 2019-08-21: 06:00:00 1200 [IU]/h via INTRAVENOUS
  Administered 2019-08-22: 1100 [IU]/h via INTRAVENOUS
  Filled 2019-08-21 (×2): qty 250

## 2019-08-21 MED ORDER — BUDESONIDE 0.5 MG/2ML IN SUSP
0.5000 mg | Freq: Two times a day (BID) | RESPIRATORY_TRACT | Status: DC
  Administered 2019-08-21 – 2019-08-29 (×16): 0.5 mg via RESPIRATORY_TRACT
  Filled 2019-08-21 (×17): qty 2

## 2019-08-21 MED ORDER — OXYCODONE HCL 5 MG PO TABS
5.0000 mg | ORAL_TABLET | Freq: Four times a day (QID) | ORAL | Status: DC | PRN
  Administered 2019-08-21 – 2019-08-23 (×3): 5 mg via ORAL
  Filled 2019-08-21 (×3): qty 1

## 2019-08-21 MED ORDER — METHYLPREDNISOLONE SODIUM SUCC 40 MG IJ SOLR
40.0000 mg | INTRAMUSCULAR | Status: DC
  Administered 2019-08-22 – 2019-08-23 (×2): 40 mg via INTRAVENOUS
  Filled 2019-08-21 (×2): qty 1

## 2019-08-21 MED ORDER — INSULIN GLARGINE 100 UNIT/ML ~~LOC~~ SOLN
5.0000 [IU] | Freq: Every day | SUBCUTANEOUS | Status: DC
  Administered 2019-08-21: 5 [IU] via SUBCUTANEOUS
  Filled 2019-08-21 (×2): qty 0.05

## 2019-08-21 MED ORDER — INSULIN GLARGINE 100 UNIT/ML ~~LOC~~ SOLN
10.0000 [IU] | Freq: Every day | SUBCUTANEOUS | Status: DC
  Administered 2019-08-21 – 2019-08-28 (×8): 10 [IU] via SUBCUTANEOUS
  Filled 2019-08-21 (×10): qty 0.1

## 2019-08-21 MED ORDER — CHLORHEXIDINE GLUCONATE 0.12% ORAL RINSE (MEDLINE KIT)
15.0000 mL | Freq: Two times a day (BID) | OROMUCOSAL | Status: DC
  Administered 2019-08-21 – 2019-08-23 (×5): 15 mL via OROMUCOSAL

## 2019-08-21 MED ORDER — INSULIN ASPART 100 UNIT/ML ~~LOC~~ SOLN
0.0000 [IU] | SUBCUTANEOUS | Status: DC
  Administered 2019-08-21: 3 [IU] via SUBCUTANEOUS
  Administered 2019-08-21: 4 [IU] via SUBCUTANEOUS
  Administered 2019-08-21 (×2): 7 [IU] via SUBCUTANEOUS
  Administered 2019-08-21: 04:00:00 4 [IU] via SUBCUTANEOUS
  Administered 2019-08-22 (×2): 3 [IU] via SUBCUTANEOUS
  Administered 2019-08-22 (×2): 11 [IU] via SUBCUTANEOUS
  Administered 2019-08-22: 3 [IU] via SUBCUTANEOUS
  Administered 2019-08-22 – 2019-08-23 (×2): 4 [IU] via SUBCUTANEOUS
  Administered 2019-08-23 – 2019-08-24 (×5): 3 [IU] via SUBCUTANEOUS
  Administered 2019-08-24 – 2019-08-25 (×2): 4 [IU] via SUBCUTANEOUS
  Administered 2019-08-25: 3 [IU] via SUBCUTANEOUS
  Administered 2019-08-25: 4 [IU] via SUBCUTANEOUS
  Administered 2019-08-25: 3 [IU] via SUBCUTANEOUS
  Administered 2019-08-25: 05:00:00 4 [IU] via SUBCUTANEOUS
  Administered 2019-08-25: 7 [IU] via SUBCUTANEOUS
  Administered 2019-08-26 (×2): 4 [IU] via SUBCUTANEOUS
  Administered 2019-08-26: 11:00:00 15 [IU] via SUBCUTANEOUS
  Administered 2019-08-26: 17:00:00 4 [IU] via SUBCUTANEOUS
  Administered 2019-08-27: 11 [IU] via SUBCUTANEOUS
  Administered 2019-08-27: 4 [IU] via SUBCUTANEOUS
  Administered 2019-08-27: 12:00:00 7 [IU] via SUBCUTANEOUS
  Administered 2019-08-27: 18:00:00 11 [IU] via SUBCUTANEOUS
  Administered 2019-08-27: 3 [IU] via SUBCUTANEOUS
  Administered 2019-08-28: 4 [IU] via SUBCUTANEOUS
  Administered 2019-08-28: 3 [IU] via SUBCUTANEOUS
  Administered 2019-08-28 – 2019-08-29 (×5): 4 [IU] via SUBCUTANEOUS
  Administered 2019-08-29: 3 [IU] via SUBCUTANEOUS

## 2019-08-21 MED ORDER — METOPROLOL TARTRATE 5 MG/5ML IV SOLN: 2.5000 mg | INTRAVENOUS | Status: DC | PRN

## 2019-08-21 NOTE — Progress Notes (Signed)
  Echocardiogram 2D Echocardiogram has been performed.  Nicholas Caldwell 08/21/2019, 1:07 PM

## 2019-08-21 NOTE — Progress Notes (Signed)
ANTICOAGULATION CONSULT NOTE - Follow Up  Consult  Pharmacy Consult for heparin Indication: atrial fibrillation  No Known Allergies  Patient Measurements: Height: _0  (180.3 cm)(pt measured x3) Weight: 273 lb 13 oz (124.2 kg) IBW/kg (Calculated) : 75.3 Heparin Dosing Weight: 100kg  Vital Signs: Temp: 98.4 F (36.9 C) (10/18 1125) Temp Source: Oral (10/18 1125) BP: 105/55 (10/18 1900) Pulse Rate: 74 (10/18 1900)  Labs: Recent Labs    08/20/19 2210  08/20/19 2253  08/21/19 0017 08/21/19 0158 08/21/19 0602 08/21/19 1201 08/21/19 1835 08/21/19 1921  HGB  --    < > 11.6*  --  9.9* 9.8*  --   --   --   --   HCT  --   --  34.0*  --  35.8* 33.9*  --   --   --   --   PLT  --   --   --   --  356 348  --   --   --   --   APTT  --   --   --    < > 29  --   --  72* 141* 122*  LABPROT  --   --   --   --  17.3*  --  18.1* 18.6*  --   --   INR  --   --   --   --  1.4*  --  1.5* 1.6*  --   --   HEPARINUNFRC  --   --   --   --   --   --  >2.20*  --   --   --   CREATININE 2.90*  --   --   --  2.75* 2.53*  --   --   --   --   TROPONINIHS 52*  --   --   --  75* 127*  --   --   --   --    < > = values in this interval not displayed.    Estimated Creatinine Clearance: 35.9 mL/min (A) (by C-G formula based on SCr of 2.53 mg/dL (H)).   Medications:  Scheduled:  . arformoterol  15 mcg Nebulization BID  . budesonide (PULMICORT) nebulizer solution  0.5 mg Nebulization BID  . chlorhexidine gluconate (MEDLINE KIT)  15 mL Mouth Rinse BID  . Chlorhexidine Gluconate Cloth  6 each Topical Daily  . furosemide  40 mg Intravenous Q12H  . gabapentin  300 mg Oral BID  . insulin aspart  0-20 Units Subcutaneous Q4H  . insulin glargine  10 Units Subcutaneous QHS  . ipratropium-albuterol  3 mL Nebulization Q8H  . [START ON 08/22/2019] methylPREDNISolone (SOLU-MEDROL) injection  40 mg Intravenous Q24H  . sodium chloride flush  10-40 mL Intracatheter Q12H   Infusions:  . sodium chloride    .  famotidine (PEPCID) IV Stopped (08/21/19 1405)  . heparin 1,200 Units/hr (08/21/19 1900)  . piperacillin-tazobactam (ZOSYN)  IV 12.5 mL/hr at 08/21/19 1900    Assessment: 73yo male presents to ED post arrest, known to pharmacy from multiple prior admissions, to transition from Eliquis to heparin for Afib; pt's wife states that pt took his evening meds prior to arrival.  RN alerted pharmacy to pt oozing at neck site, repeat aPTT above goal at 122 seconds, will reduce rate and recheck in the morning.   Goal of Therapy:  aPTT 66-102 seconds Monitor platelets by anticoagulation protocol: Yes   Plan:  Reduce heparin to 1100 units/hr Daily aPTT  Arrie Senate, PharmD, BCPS Clinical Pharmacist [phone] Please check AMION for all [place] numbers 08/21/2019

## 2019-08-21 NOTE — Progress Notes (Addendum)
NAME:  Nicholas Caldwell, MRN:  [id number], DOB:  April 26, 1946, LOS: 1 ADMISSION DATE:  08/20/2019, CONSULTATION DATE:  08/21/19 REFERRING MD:  Audelia Hives , CHIEF COMPLAINT:  NA  Brief History   73 y/o M admitted on 10/17 after suffering a PEA arrest (required 12 min CPR before ROSC).  Recent admit from 10/6-10/12 for RLL PNA, CHF decompensation and COPD exacerbation. CBG's noted to be >400, weight up 4lbs.    Past Medical History  HTN pAF - on eliquis  ACD  HfrEF COPD OSA  Significant Hospital Events   10/17 Admit post PEA arrest, intubated 10/18 Cooling protocol stopped as pt awake / alert   Consults:  Cardiology   Procedures:  ETT 10/17 >>  R IJ TLC 10/17 >>  Significant Diagnostic Tests:  CT Head 10/18 >> negative for acute abnormality   Micro Data:  COVID 10/18 >> negative  BCx2 10/18 >>   Antimicrobials:  Vanco 10/17 >> 10/18  Zosyn 10/18 >>     Interim history/subjective:  RN reports pt awake, alert / following commands. Indicates he is cold / shivering.  Afebrile.  I/O - 2.2 L UOP since admit.   Objective   Blood pressure (!) 109/58, pulse 65, temperature (!) 97.2 F (36.2 C), resp. rate 18, height _0  (1.803 m), weight 124.2 kg, SpO2 96 %. CVP:  [15 mmHg] 15 mmHg  Vent Mode: PRVC FiO2 (%):  [40 %-100 %] 40 % Set Rate:  [18 bmp] 18 bmp Vt Set:  [550 mL-600 mL] 600 mL PEEP:  [5 cmH20] 5 cmH20 Plateau Pressure:  [39 cmH20-42 cmH20] 39 cmH20   Intake/Output Summary (Last 24 hours) at 08/21/2019 0737 Last data filed at 08/21/2019 0700 Gross per 24 hour  Intake 1728.65 ml  Output 2200 ml  Net -471.35 ml   Filed Weights   08/21/19 0032 08/21/19 0210  Weight: 122.5 kg 124.2 kg    Examination: General: elderly, chronically ill appearing gentleman on vent in NAD   HEENT: MM pink/moist, ETT Neuro: Awake, alert, follows commands  CV: s1s2 rrr, SR on monitor, no m/r/g PULM:  Even/non-labored on vent, occasional coughing with movement, lungs bilaterally clear  anterior, diminished bases  GI: soft, bsx4 active  Extremities: warm/dry, 1+ BLE edema   Skin: no rashes or lesions  Resolved Hospital Problem list     Assessment & Plan:    PEA Arrest Suspect primary pulmonary as patient was not wearing bipap post discharge.  Wheezing on admit.  EKG unchanged from prior admit, troponin with mild leak / likely demand. CT head negative.  -appreciate Cardiology evaluation  -ICU monitoring  -defer repeat ECHO to Cardiology  -stop hypothermia protocol as patient is awake / alert -minimize sedation   Acute on Chronic HFrEF Dilated IVC on POCUS -lasix 40 mg BID, starting pm 10/18 as had 120 mg on admit  -follow electrolytes with diuresis  -monitor weights, strict I/O's   Acute Hypoxic / Hypercarbic Respiratory Failure  -PRVC 8cc/kg  -wean PEEP / FiO2 for sats 88-95% -PSV with goal for extubation  -follow intermittent CXR  -stop vancomycin  -trend PCT   AKI on CKD3 Baseline sr cr ~ 1.7 Trend BMP / urinary output Replace electrolytes as indicated Avoid nephrotoxic agents as able, ensure adequate renal perfusion  COPD Exacerbation Wheezing on admit.   -reduce solumedrol to 40 mg QD -continue pulmicort + brovana  -TID duoneb  -hold home symbicort   PAF Bradycardia Hx HTN, HLD On Eliquis at home.  Episode of  bradycardia in ER, suspect respiratory driven / hypercarbia -heparin gtt for now -resume Eliquis once extubated and taking PO's  -hold home bisoprolol 10/18, consider restart 10/19  -PRN metoprolol for SBP >170  OSA  Non-compliant with CPAP at home  -will need to stress importance (again) of CPAP use at home once extubated   DM 2 Hgb A1c 7.4 on 10/6.   -Increase lantus to 10 units QD -SSI, resistant scale   Best practice:  Diet:  NPO for now Pain/Anxiety/Delirium protocol (if indicated): Fentanyl drip VAP protocol (if indicated): HOB 30 degrees DVT prophylaxis: Heparin drip  GI prophylaxis: Pepcid BID Glucose control:  SSI, lantus  Mobility: NA Code Status: Full code  Family Communication: Will update wife on arrival. Patient updated on plan of care.  Disposition: ICU    Labs   CBC: Recent Labs  Lab 08/20/19 2253 08/21/19 0017 08/21/19 0158  WBC  --  8.3 8.4  HGB 11.6* 9.9* 9.8*  HCT 34.0* 35.8* 33.9*  MCV  --  93.5 91.9  PLT  --  356 620    Basic Metabolic Panel: Recent Labs  Lab 08/20/19 2210 08/20/19 2253 08/21/19 0017 08/21/19 0158  NA 136 132* 135 136  K 5.9* 5.0 6.0* 4.5  CL 90*  --  90* 89*  CO2 36*  --  31 32  GLUCOSE 231*  --  194* 198*  BUN 68*  --  69* 67*  CREATININE 2.90*  --  2.75* 2.53*  CALCIUM 8.1*  --  8.3* 8.4*  MG 2.5*  --   --  2.4  PHOS  --   --   --  5.2*   GFR: Estimated Creatinine Clearance: 35.9 mL/min (A) (by C-G formula based on SCr of 2.53 mg/dL (H)). Recent Labs  Lab 08/21/19 0017 08/21/19 0158  WBC 8.3 8.4    Liver Function Tests: Recent Labs  Lab 08/20/19 2210  AST 26  ALT 31  ALKPHOS 172*  BILITOT 0.6  PROT 6.3*  ALBUMIN 3.1*   Recent Labs  Lab 08/20/19 2210  LIPASE 22   No results for input(s): AMMONIA in the last 168 hours.  ABG    Component Value Date/Time   PHART 7.339 (L) 08/20/2019 2253   PCO2ART 66.6 (HH) 08/20/2019 2253   PO2ART 98.0 08/20/2019 2253   HCO3 36.2 (H) 08/20/2019 2253   TCO2 38 (H) 08/20/2019 2253   O2SAT 97.0 08/20/2019 2253     Coagulation Profile: Recent Labs  Lab 08/21/19 0017 08/21/19 0602  INR 1.4* 1.5*    Cardiac Enzymes: No results for input(s): CKTOTAL, CKMB, CKMBINDEX, TROPONINI in the last 168 hours.  HbA1C: No results found for: HGBA1C  CBG: Recent Labs  Lab 08/21/19 0027 08/21/19 0200 08/21/19 0320  GLUCAP 184* 194* 194*    Critical care time:  35 minutes     Noe Gens, NP-C [facility] Pgr: [phone] or if no answer [phone] 08/21/2019, 7:37 AM

## 2019-08-21 NOTE — Progress Notes (Signed)
[place] Progress Note Patient Name: Nicholas Caldwell DOB: February 16, 1946 MRN: [id number]   Date of Service  08/21/2019  HPI/Events of Note  R IJ CVL leaking around catheter.  eICU Interventions  Will order: 1. Apply Thrombipad. 2. Reinforce dressing.      Intervention Category Major Interventions: Other:  Lysle Dingwall 08/21/2019, 9:20 PM

## 2019-08-21 NOTE — Progress Notes (Signed)
ANTICOAGULATION CONSULT NOTE - Initial Consult  Pharmacy Consult for heparin Indication: atrial fibrillation  No Known Allergies  Patient Measurements: Height: _0  (180.3 cm)(pt measured x3) Weight: 273 lb 13 oz (124.2 kg) IBW/kg (Calculated) : 75.3 Heparin Dosing Weight: 100kg  Vital Signs: Temp: 96.8 F (36 C) (10/18 0500) Temp Source: Bladder (10/18 0300) BP: 130/71 (10/18 0430) Pulse Rate: 64 (10/18 0500)  Labs: Recent Labs    08/20/19 2210  08/20/19 2253 08/21/19 0017 08/21/19 0158  HGB  --    < > 11.6* 9.9* 9.8*  HCT  --   --  34.0* 35.8* 33.9*  PLT  --   --   --  356 348  APTT  --   --   --  29  --   LABPROT  --   --   --  17.3*  --   INR  --   --   --  1.4*  --   CREATININE 2.90*  --   --  2.75* 2.53*  TROPONINIHS 52*  --   --  75* 127*   < > = values in this interval not displayed.    Estimated Creatinine Clearance: 35.9 mL/min (A) (by C-G formula based on SCr of 2.53 mg/dL (H)).   Medications:  Scheduled:  . arformoterol  15 mcg Nebulization BID  . budesonide (PULMICORT) nebulizer solution  0.25 mg Nebulization BID  . chlorhexidine gluconate (MEDLINE KIT)  15 mL Mouth Rinse BID  . Chlorhexidine Gluconate Cloth  6 each Topical Daily  . heparin  5,000 Units Subcutaneous Q8H  . insulin aspart  0-20 Units Subcutaneous Q4H  . insulin glargine  5 Units Subcutaneous QHS  . ipratropium-albuterol  3 mL Nebulization BID  . mouth rinse  15 mL Mouth Rinse 10 times per day  . methylPREDNISolone (SOLU-MEDROL) injection  125 mg Intravenous Q24H  . sodium chloride flush  10-40 mL Intracatheter Q12H   Infusions:  . sodium chloride    . famotidine (PEPCID) IV Stopped (08/21/19 0230)  . fentaNYL infusion INTRAVENOUS 150 mcg/hr (08/21/19 0354)  . norepinephrine (LEVOPHED) Adult infusion 3 mcg/min (08/21/19 0300)  . piperacillin-tazobactam (ZOSYN)  IV    . vancomycin 250 mL/hr at 08/21/19 0300    Assessment: 72yo male presents to ED post arrest, known to  pharmacy from multiple prior admissions, to transition from Eliquis to heparin for Afib; pt's wife states that pt took his evening meds prior to arrival.  Goal of Therapy:  aPTT 66-102 seconds Monitor platelets by anticoagulation protocol: Yes   Plan:  Rec'd SQ heparin <15mn ago; will start heparin gtt at 1200 units/hr (was on this rate on recent admission) and monitor heparin levels, aPTT (while Eliquis affects anti-Xa), and CBC.  VWynona Neat PharmD, BCPS  08/21/2019,5:17 AM

## 2019-08-21 NOTE — Progress Notes (Signed)
Pharmacy Antibiotic Note  Nicholas Caldwell is a 73 y.o. male admitted on 08/20/2019 with bacteremia.  Pharmacy has been consulted for vancomycin and zosyn dosing.Vancomycin 1gm ordered in ED  Plan: Vanc 1500 mg IV q48 hours Zosyn EI F/u renal function, cultures and clinical course  Height: _0  (177.8 cm) Weight: 270 lb (122.5 kg) IBW/kg (Calculated) : 73  Temp (24hrs), Avg:97.1 F (36.2 C), Min:95.6 F (35.3 C), Max:97.8 F (36.6 C)  Recent Labs  Lab 08/20/19 2210 08/21/19 0017  WBC  --  8.3  CREATININE 2.90*  --     Estimated Creatinine Clearance: 30.7 mL/min (A) (by C-G formula based on SCr of 2.9 mg/dL (H)).    No Known Allergies   Thank you for allowing pharmacy to be a part of this patient's care.  Excell Seltzer Poteet 08/21/2019 1:40 AM

## 2019-08-21 NOTE — Evaluation (Signed)
Clinical/Bedside Swallow Evaluation Patient Details  Name: Nicholas Caldwell MRN: [id number] Date of Birth: 07-08-1946  Today's Date: 08/21/2019 Time: SLP Start Time (ACUTE ONLY): 1440 SLP Stop Time (ACUTE ONLY): 1456 SLP Time Calculation (min) (ACUTE ONLY): 16 min  Past Medical History: History reviewed. No pertinent past medical history. Past Surgical History: History reviewed. No pertinent surgical history. HPI:  73 y/o M admitted on 10/17 after suffering a PEA arrest (required 12 min CPR before ROSC).  Recent admit from 10/6-10/12 for RLL PNA, CHF decompensation and COPD exacerbation. Intubated less than 24 hours.    Assessment / Plan / Recommendation Clinical Impression  Pt demonstrates no signs of aspiration, passed the RN administered Yale swallow protocol today. Pt seen with full liquid meal, tolerated this without coughing or throat clearing. Pt is anxious for food but dentures are not present. He was able to masticate regular solids, but benefitted from a pudding or liquid wash to form the dry bolus. Mildly prolonged oral phase ntoed at times with swishing behavior. Will advance diet to regular texture though advised wife to choose some softer foods until she brings his dentures. No SLP f/u needed will sign off.  SLP Visit Diagnosis: Dysphagia, oral phase (R13.11)    Aspiration Risk  Mild aspiration risk    Diet Recommendation Regular;Thin liquid   Liquid Administration via: Cup;Straw Medication Administration: Whole meds with liquid Supervision: Staff to assist with self feeding Compensations: Slow rate;Small sips/bites Postural Changes: Seated upright at 90 degrees    Other  Recommendations Oral Care Recommendations: Oral care BID   Follow up Recommendations None      Frequency and Duration            Prognosis        Swallow Study   General HPI: 73 y/o M admitted on 10/17 after suffering a PEA arrest (required 12 min CPR before ROSC).  Recent admit from 10/6-10/12  for RLL PNA, CHF decompensation and COPD exacerbation. Intubated less than 24 hours.  Type of Study: Bedside Swallow Evaluation Previous Swallow Assessment: none in chart Diet Prior to this Study: Thin liquids Temperature Spikes Noted: No Respiratory Status: Nasal cannula History of Recent Intubation: Yes Length of Intubations (days): 1 days Date extubated: 08/21/19 Behavior/Cognition: Alert;Cooperative Oral Cavity Assessment: Within Functional Limits Oral Care Completed by SLP: No Oral Cavity - Dentition: Edentulous;Dentures, not available Vision: Functional for self-feeding Self-Feeding Abilities: Able to feed self;Needs assist Patient Positioning: Upright in bed Baseline Vocal Quality: Normal Volitional Cough: Strong Volitional Swallow: Able to elicit    Oral/Motor/Sensory Function Overall Oral Motor/Sensory Function: Within functional limits   Ice Chips Ice chips: Not tested   Thin Liquid Thin Liquid: Impaired Oral Phase Functional Implications: Prolonged oral transit    Nectar Thick Nectar Thick Liquid: Not tested   Honey Thick Honey Thick Liquid: Not tested   Puree Puree: Within functional limits Presentation: Spoon   Solid     Solid: Impaired Presentation: Self Fed Oral Phase Functional Implications: Oral residue     Nicholas [place], MA CCC-SLP  Acute Rehabilitation Services Pager [phone] Office [phone]  Nicholas Caldwell 08/21/2019,3:01 PM

## 2019-08-21 NOTE — Procedures (Signed)
Extubation Procedure Note  Patient Details:   Name: Nicholas Caldwell DOB: 1946-04-09 MRN: [id number]   Airway Documentation:    Vent end date: 08/21/19 Vent end time: 1125   Evaluation  O2 sats: stable throughout Complications: No apparent complications Patient did tolerate procedure well. Bilateral Breath Sounds: Diminished, Clear   Yes   Pt was extubated per MD order and placed on 4 L [place]. Cuff leak was noted prior to extubation. Pt is stable at this time. RT will monitor.   Ronaldo Miyamoto 08/21/2019, 11:28 AM

## 2019-08-21 NOTE — Progress Notes (Signed)
Progress Note   Subjective   Alert on vent,  Rewarming has been started  Inpatient Medications    Scheduled Meds: . arformoterol  15 mcg Nebulization BID  . budesonide (PULMICORT) nebulizer solution  0.5 mg Nebulization BID  . chlorhexidine gluconate (MEDLINE KIT)  15 mL Mouth Rinse BID  . Chlorhexidine Gluconate Cloth  6 each Topical Daily  . insulin aspart  0-20 Units Subcutaneous Q4H  . insulin glargine  10 Units Subcutaneous QHS  . ipratropium-albuterol  3 mL Nebulization Q8H  . mouth rinse  15 mL Mouth Rinse 10 times per day  . [START ON 08/22/2019] methylPREDNISolone (SOLU-MEDROL) injection  40 mg Intravenous Q24H  . sodium chloride flush  10-40 mL Intracatheter Q12H   Continuous Infusions: . sodium chloride    . famotidine (PEPCID) IV Stopped (08/21/19 0230)  . fentaNYL infusion INTRAVENOUS 300 mcg/hr (08/21/19 0700)  . heparin 1,200 Units/hr (08/21/19 0700)  . piperacillin-tazobactam (ZOSYN)  IV 3.375 g (08/21/19 0813)   PRN Meds: sodium chloride, sodium chloride flush   Vital Signs    Vitals:   08/21/19 0600 08/21/19 0700 08/21/19 0800 08/21/19 0810  BP: 116/82 (!) 109/58 (!) 158/101 (!) 158/108  Pulse: 71 65 74 66  Resp: 20 18 (!) 21 (!) 24  Temp: (!) 97.2 F (36.2 C) (!) 97.2 F (36.2 C)  98.1 F (36.7 C)  TempSrc:    Oral  SpO2: (!) 88% 96% 98% 91%  Weight:      Height:        Intake/Output Summary (Last 24 hours) at 08/21/2019 3976 Last data filed at 08/21/2019 0700 Gross per 24 hour  Intake 1728.65 ml  Output 2200 ml  Net -471.35 ml   Filed Weights   08/21/19 0032 08/21/19 0210  Weight: 122.5 kg 124.2 kg    Telemetry    Sinus,  Rare accelerated junctional rhythm, no AV block or pauses - Personally Reviewed  Physical Exam   GEN- The patient is chronically ill appearing, alert on vent Head- normocephalic, atraumatic Eyes-  Sclera clear, conjunctiva pink Ears- hearing intact Oropharynx- ETT Neck- supple, Lungs- breathing is stable  on vent Heart- Regular rate and rhythm  Extremities- no clubbing, cyanosis, 2+ edema  MS- no significant deformity or atrophy Skin- no rash or lesion Psych- on vent Neuro- intubated, on vent   Labs    Chemistry Recent Labs  Lab 08/20/19 2210 08/20/19 2253 08/21/19 0017 08/21/19 0158  NA 136 132* 135 136  K 5.9* 5.0 6.0* 4.5  CL 90*  --  90* 89*  CO2 36*  --  31 32  GLUCOSE 231*  --  194* 198*  BUN 68*  --  69* 67*  CREATININE 2.90*  --  2.75* 2.53*  CALCIUM 8.1*  --  8.3* 8.4*  PROT 6.3*  --   --   --   ALBUMIN 3.1*  --   --   --   AST 26  --   --   --   ALT 31  --   --   --   ALKPHOS 172*  --   --   --   BILITOT 0.6  --   --   --   GFRNONAA 21*  --  22* 25*  GFRAA 24*  --  26* 28*  ANIONGAP 10  --  14 15     Hematology Recent Labs  Lab 08/20/19 2253 08/21/19 0017 08/21/19 0158  WBC  --  8.3 8.4  RBC  --  3.83* 3.69*  HGB 11.6* 9.9* 9.8*  HCT 34.0* 35.8* 33.9*  MCV  --  93.5 91.9  MCH  --  25.8* 26.6  MCHC  --  27.7* 28.9*  RDW  --  20.5* 20.4*  PLT  --  356 348     Patient Profile:   Nicholas Caldwell a 73 y.o.malewith a history of chronic systolic CHFdue to NICM (EF 40-45%)with moderate RV failure, COPD on 3 L O2, DM, HTN, OHS/OSA on CPAP, PAF s/p DCCV, hyperlipidemia, CAD, and noncompliance. He is brought to the ED tonight after witnessed PEA arrest at home. Cardiology is consulted for bradycardia following cardiac arrest.   Assessment & Plan    1.  S/p PEA arrest He has substantial lung disease.  Hypercarbia is the likely cause, possibly exacerbated by taking his pain medicine and not wearing Bipap. PCCM is managing  2. Tachy/brady He had transient atrial arrhythmias followed by junctional rhythm while recovering from his PEA arrest.  I doubt that his "post code" arrhythmias will be recurrent.  Resume home beta blocker dosing when able. Continue to follow on telemetry No further EP workup is planned currently  3. Atrial fibrillation On  heparin drip Could return to eliquis when taking POs if no other interventions are planned  4. Acute on chronic biventricular failure Multiple hospitalizations for CHF Remains volume overloaded. Followed in CHF clinic.  I have informed Dr [name] of the patients arrest.  Advanced CHF team to assume care tomorrow. Continue IV diuresis as BP allows  5. HTN Stable No change required today  6. Diabetes Marked hyperglycemia noted Per PCCM  7. OSA Compliance with BiPAP is essential long term  Critical care time was exclusive of separate billable procedures and treating other patients.  Critial care time was spent personally by me (independant of midlevel providers) on the following activities: development of treatment plan with patient as well as nursing, discussions with PCCM, evaluation of patients response to treatment, examining patient, reviewing treatments/ interventions, lab studies, radiographic studies, pulse ox, and re-evaluation of patients condition.     The patient is critically ill with multiple organ systems failure and requires high complexity decision making for assessment and support, frequent evaluation and titration of therapies, application of advanced monitoring technologies and extensive interpretation of databases.   Critical care was necessary to treat or prevent immintent or life-threatening deterioration.   Total CCT spent directly with the patient today is 45 minutes  Thompson Grayer MD, [facility] 08/21/2019 8:39 AM

## 2019-08-21 NOTE — Procedures (Signed)
Central Venous Catheter Insertion Procedure Note Nicholas Caldwell [id number] [birth year]  Procedure: Insertion of Central Venous Catheter Indications: Assessment of intravascular volume, Drug and/or fluid administration and Frequent blood sampling  Procedure Details Consent: Unable to obtain consent because of emergent medical necessity. Time Out: Verified patient identification, verified procedure, site/side was marked, verified correct patient position, special equipment/implants available, medications/allergies/relevent history reviewed, required imaging and test results available.  Performed  Maximum sterile technique was used including antiseptics, cap, gloves, gown, hand hygiene, mask and sheet. Skin prep: Chlorhexidine; local anesthetic administered 3 ml lido 1% A antimicrobial bonded/coated triple lumen catheter was placed in the right internal jugular vein using the Seldinger technique to 18 cm.  Line sutured.  Biopatch and sterile dressing applied.    Evaluation Blood flow good Complications: No apparent complications Patient did tolerate procedure well. Chest X-ray ordered to verify placement.  CXR: pending.  Procedure performed with ultrasound guidance for real time vessel cannulation.      Kennieth Rad, MSN, AGACNP-BC [facility] Pgr: [phone] or if no answer [phone] 08/21/2019, 1:02 AM

## 2019-08-21 NOTE — Progress Notes (Signed)
ANTICOAGULATION CONSULT NOTE - Follow Up  Consult  Pharmacy Consult for heparin Indication: atrial fibrillation  No Known Allergies  Patient Measurements: Height: _0  (180.3 cm)(pt measured x3) Weight: 273 lb 13 oz (124.2 kg) IBW/kg (Calculated) : 75.3 Heparin Dosing Weight: 100kg  Vital Signs: Temp: 98.4 F (36.9 C) (10/18 1125) Temp Source: Oral (10/18 1125) BP: 103/54 (10/18 1400) Pulse Rate: 69 (10/18 1400)  Labs: Recent Labs    08/20/19 2210  08/20/19 2253 08/21/19 0017 08/21/19 0158 08/21/19 0602 08/21/19 1201  HGB  --    < > 11.6* 9.9* 9.8*  --   --   HCT  --   --  34.0* 35.8* 33.9*  --   --   PLT  --   --   --  356 348  --   --   APTT  --   --   --  29  --   --  72*  LABPROT  --   --   --  17.3*  --  18.1* 18.6*  INR  --   --   --  1.4*  --  1.5* 1.6*  HEPARINUNFRC  --   --   --   --   --  >2.20*  --   CREATININE 2.90*  --   --  2.75* 2.53*  --   --   TROPONINIHS 52*  --   --  75* 127*  --   --    < > = values in this interval not displayed.    Estimated Creatinine Clearance: 35.9 mL/min (A) (by C-G formula based on SCr of 2.53 mg/dL (H)).   Medications:  Scheduled:  . arformoterol  15 mcg Nebulization BID  . budesonide (PULMICORT) nebulizer solution  0.5 mg Nebulization BID  . chlorhexidine gluconate (MEDLINE KIT)  15 mL Mouth Rinse BID  . Chlorhexidine Gluconate Cloth  6 each Topical Daily  . furosemide  40 mg Intravenous Q12H  . gabapentin  300 mg Oral BID  . insulin aspart  0-20 Units Subcutaneous Q4H  . insulin glargine  10 Units Subcutaneous QHS  . ipratropium-albuterol  3 mL Nebulization Q8H  . mouth rinse  15 mL Mouth Rinse 10 times per day  . [START ON 08/22/2019] methylPREDNISolone (SOLU-MEDROL) injection  40 mg Intravenous Q24H  . sodium chloride flush  10-40 mL Intracatheter Q12H   Infusions:  . sodium chloride    . famotidine (PEPCID) IV 100 mL/hr at 08/21/19 1400  . heparin 1,200 Units/hr (08/21/19 1400)  .  piperacillin-tazobactam (ZOSYN)  IV Stopped (08/21/19 1217)    Assessment: 73yo male presents to ED post arrest, known to pharmacy from multiple prior admissions, to transition from Eliquis to heparin for Afib; pt's wife states that pt took his evening meds prior to arrival.  Heparin drip 1200 uts/hr aptt 72sec at goal, no bleeding - extubated - could discuss restart apixaban tomorrow  Goal of Therapy:  aPTT 66-102 seconds Monitor platelets by anticoagulation protocol: Yes   Plan:  Continue Heparin drip 1200 uts/hr  Daily PTT, CBC while on heparin  Bonnita Nasuti Pharm.D. CPP, BCPS Clinical Pharmacist [phone] 08/21/2019 2:54 PM

## 2019-08-22 ENCOUNTER — Inpatient Hospital Stay (HOSPITAL_COMMUNITY): Payer: Medicare Other

## 2019-08-22 DIAGNOSIS — I2609 Other pulmonary embolism with acute cor pulmonale: Secondary | ICD-10-CM | POA: Diagnosis not present

## 2019-08-22 DIAGNOSIS — N179 Acute kidney failure, unspecified: Secondary | ICD-10-CM

## 2019-08-22 DIAGNOSIS — I469 Cardiac arrest, cause unspecified: Secondary | ICD-10-CM | POA: Diagnosis not present

## 2019-08-22 DIAGNOSIS — I5021 Acute systolic (congestive) heart failure: Secondary | ICD-10-CM

## 2019-08-22 LAB — GLUCOSE, CAPILLARY
Glucose-Capillary: 125 mg/dL — ABNORMAL HIGH (ref 70–99)
Glucose-Capillary: 253 mg/dL — ABNORMAL HIGH (ref 70–99)
Glucose-Capillary: 290 mg/dL — ABNORMAL HIGH (ref 70–99)
Glucose-Capillary: 157 mg/dL — ABNORMAL HIGH (ref 70–99)
Glucose-Capillary: 139 mg/dL — ABNORMAL HIGH (ref 70–99)

## 2019-08-22 LAB — BASIC METABOLIC PANEL
Anion gap: 14 (ref 5–15)
BUN: 72 mg/dL — ABNORMAL HIGH (ref 8–23)
CO2: 34 mmol/L — ABNORMAL HIGH (ref 22–32)
Calcium: 8.5 mg/dL — ABNORMAL LOW (ref 8.9–10.3)
Chloride: 93 mmol/L — ABNORMAL LOW (ref 98–111)
Creatinine, Ser: 2.6 mg/dL — ABNORMAL HIGH (ref 0.61–1.24)
GFR calc Af Amer: 28 mL/min — ABNORMAL LOW (ref >60)
GFR calc non Af Amer: 24 mL/min — ABNORMAL LOW (ref >60)
Glucose, Bld: 129 mg/dL — ABNORMAL HIGH (ref 70–99)
Potassium: 4 mmol/L (ref 3.5–5.1)
Sodium: 141 mmol/L (ref 135–145)

## 2019-08-22 LAB — HEMOGLOBIN A1C
Hgb A1c MFr Bld: 7.4 % — ABNORMAL HIGH (ref 4.8–5.6)
Mean Plasma Glucose: 166 mg/dL

## 2019-08-22 LAB — CBC
HCT: 28.3 % — ABNORMAL LOW (ref 39.0–52.0)
Hemoglobin: 8.6 g/dL — ABNORMAL LOW (ref 13.0–17.0)
MCH: 26.3 pg (ref 26.0–34.0)
MCHC: 30.4 g/dL (ref 30.0–36.0)
MCV: 86.5 fL (ref 80.0–100.0)
Platelets: 338 10*3/uL (ref 150–400)
RBC: 3.27 MIL/uL — ABNORMAL LOW (ref 4.22–5.81)
RDW: 20.5 % — ABNORMAL HIGH (ref 11.5–15.5)
WBC: 7.3 10*3/uL (ref 4.0–10.5)
nRBC: 1.1 % — ABNORMAL HIGH (ref 0.0–0.2)

## 2019-08-22 LAB — HEPARIN LEVEL (UNFRACTIONATED): Heparin Unfractionated: >2.2 IU/mL — ABNORMAL HIGH (ref 0.30–0.70)

## 2019-08-22 LAB — COOXEMETRY PANEL
Carboxyhemoglobin: 1.3 % (ref 0.5–1.5)
Methemoglobin: 0.8 % (ref 0.0–1.5)
O2 Saturation: 92.8 %
Total hemoglobin: 8.9 g/dL — ABNORMAL LOW (ref 12.0–16.0)

## 2019-08-22 LAB — MAGNESIUM: Magnesium: 2.4 mg/dL (ref 1.7–2.4)

## 2019-08-22 LAB — APTT: aPTT: 126 seconds — ABNORMAL HIGH (ref 24–36)

## 2019-08-22 LAB — PROCALCITONIN: Procalcitonin: 0.73 ng/mL

## 2019-08-22 MED ORDER — FUROSEMIDE 10 MG/ML IJ SOLN
80.0000 mg | Freq: Once | INTRAMUSCULAR | Status: AC
  Administered 2019-08-22: 09:00:00 80 mg via INTRAVENOUS
  Filled 2019-08-22: qty 8

## 2019-08-22 MED ORDER — APIXABAN 5 MG PO TABS
5.0000 mg | ORAL_TABLET | Freq: Two times a day (BID) | ORAL | Status: DC
  Administered 2019-08-22 – 2019-08-23 (×3): 5 mg via ORAL
  Filled 2019-08-22 (×3): qty 1

## 2019-08-22 MED ORDER — FUROSEMIDE 10 MG/ML IJ SOLN
120.0000 mg | Freq: Two times a day (BID) | INTRAVENOUS | Status: DC
  Administered 2019-08-22 – 2019-08-25 (×5): 120 mg via INTRAVENOUS
  Filled 2019-08-22: qty 10
  Filled 2019-08-22: qty 12
  Filled 2019-08-22: qty 10
  Filled 2019-08-22 (×2): qty 12
  Filled 2019-08-22: qty 10
  Filled 2019-08-22: qty 12

## 2019-08-22 NOTE — Evaluation (Signed)
Physical Therapy Evaluation Patient Details Name: Nicholas Caldwell MRN: [id number] DOB: 1946-03-16 Today's Date: 08/22/2019   History of Present Illness  73 y/o M admitted on 10/17 after suffering a PEA arrest (required 12 min CPR before ROSC).  Recent admit from 10/6-10/12 for RLL PNA, CHF decompensation and COPD exacerbation. CBG's noted to be >400, weight up 4lbs.  Clinical Impression  Patient well known from multiple recent admissions.  He presents with decreased mobility due to weakness, decreased balance, decreased activity tolerance and poor safety awareness.  Reports his legs are shaky and he can go down so recently just doing bed to chair transfers at home.  Today able to walk to door in room with [place]on [place] walker and min A with +2 A for lines & Chair follow.  Feel he can benefit from skilled PT in the acute setting and follow up CIR level rehab at d/c.  He has significantly decreased mobility and poor activity tolerance and feel intensive inpatient rehab can help him mobilize more easily at home and help decrease risk of readmission.    Follow Up Recommendations CIR;Supervision/Assistance - 24 hour    Equipment Recommendations  None recommended by PT    Recommendations for Other Services Rehab consult     Precautions / Restrictions Precautions Precautions: Fall Precaution Comments: watch O2 Restrictions Weight Bearing Restrictions: No      Mobility  Bed Mobility               General bed mobility comments: up in chair  Transfers Overall transfer level: Needs assistance Equipment used: Bilateral platform walker(EVA walker) Transfers: Sit to/from Stand Sit to Stand: Min assist         General transfer comment: pushing up to stand from recliner  Ambulation/Gait Ambulation/Gait assistance: Min assist;+2 safety/equipment Gait Distance (Feet): 15 Feet Assistive device: Bilateral platform walker Gait Pattern/deviations: Step-to pattern;Step-through pattern;Trunk  flexed;Decreased stride length     General Gait Details: leaning heavily on walker and pushing it out ahead, assist for safety, chair follow and lines  Stairs            Wheelchair Mobility    Modified Rankin (Stroke Patients Only)       Balance                                             Pertinent Vitals/Pain Pain Assessment: Faces Faces Pain Scale: Hurts even more Pain Location: legs and chest Pain Descriptors / Indicators: Sore;Grimacing;Aching Pain Intervention(s): Monitored during session;Repositioned;Limited activity within patient's tolerance    Home Living Family/patient expects to be discharged to:: Private residence Living Arrangements: Spouse/significant other Available Help at Discharge: Family Type of Home: House Home Access: Stairs to enter   Technical brewer of Steps: 1 Home Layout: One level Home Equipment: Environmental consultant - 2 wheels;Wheelchair - manual      Prior Function Level of Independence: Needs assistance   Gait / Transfers Assistance Needed: was doing bed to chair transfers mainly, limited ambulation  ADL's / Homemaking Assistance Needed: wife assisting with ADL's        Hand Dominance        Extremity/Trunk Assessment   Upper Extremity Assessment Upper Extremity Assessment: Generalized weakness;RUE deficits/detail RUE Sensation: history of peripheral neuropathy LUE Sensation: history of peripheral neuropathy    Lower Extremity Assessment Lower Extremity Assessment: Generalized weakness;LLE deficits/detail;RLE deficits/detail RLE Sensation: history of peripheral neuropathy  LLE Sensation: history of peripheral neuropathy       Communication   Communication: No difficulties  Cognition Arousal/Alertness: Awake/alert Behavior During Therapy: WFL for tasks assessed/performed Overall Cognitive Status: History of cognitive impairments - at baseline                                 General  Comments: apologizing for having to come back due to recently here; some issues with problem solving and safety awareness at baseline      General Comments General comments (skin integrity, edema, etc.): on 8L O2 per [place] and pt requesting higher than his normal with mobility; vitals post ambulation: BP 123/48 (120/55 prior); SpO2 93%, HR 96    Exercises     Assessment/Plan    PT Assessment Patient needs continued PT services  PT Problem List Decreased strength;Decreased activity tolerance;Decreased mobility;Decreased balance;Decreased knowledge of use of DME;Cardiopulmonary status limiting activity       PT Treatment Interventions DME instruction;Therapeutic activities;Stair training;Gait training;Functional mobility training;Therapeutic exercise;Patient/family education;Balance training    PT Goals (Current goals can be found in the Care Plan section)  Acute Rehab PT Goals Patient Stated Goal: to get stronger PT Goal Formulation: With patient Time For Goal Achievement: 09/05/19 Potential to Achieve Goals: Fair    Frequency Min 3X/week   Barriers to discharge        Co-evaluation               AM-PAC PT "6 Clicks" Mobility  Outcome Measure Help needed turning from your back to your side while in a flat bed without using bedrails?: A Little Help needed moving from lying on your back to sitting on the side of a flat bed without using bedrails?: A Lot Help needed moving to and from a bed to a chair (including a wheelchair)?: A Little Help needed standing up from a chair using your arms (e.g., wheelchair or bedside chair)?: A Little Help needed to walk in hospital room?: A Little Help needed climbing 3-5 steps with a railing? : Total 6 Click Score: 15    End of Session Equipment Utilized During Treatment: Gait belt;Oxygen Activity Tolerance: Patient limited by fatigue Patient left: in chair;with call bell/phone within reach;with chair alarm set Nurse Communication:  Mobility status PT Visit Diagnosis: Other abnormalities of gait and mobility (R26.89)    Time: 1045-1105 PT Time Calculation (min) (ACUTE ONLY): 20 min   Charges:   PT Evaluation $PT Eval Moderate Complexity: [place], [place] Acute Rehabilitation Services [phone] 08/22/2019   Reginia Naas 08/22/2019, 11:47 AM

## 2019-08-22 NOTE — Progress Notes (Signed)
NAME:  Nicholas Caldwell, MRN:  [id number], DOB:  [birth year], LOS: 2 ADMISSION DATE:  08/20/2019, CONSULTATION DATE:  08/21/19 REFERRING MD:  Audelia Hives , CHIEF COMPLAINT:  NA  Brief History   73 y/o M admitted on 10/17 after suffering a PEA arrest (required 12 min CPR before ROSC).  Recent admit from 10/6-10/12 for RLL PNA, CHF decompensation and COPD exacerbation. CBG's noted to be >400, weight up 4lbs.    Past Medical History  HTN pAF - on eliquis  ACD  HfrEF COPD OSA  Significant Hospital Events   10/17 Admit post PEA arrest, intubated 10/18 Cooling protocol stopped as pt awake / alert   Consults:  Cardiology   Procedures:  ETT 10/17 >>  R IJ TLC 10/17 >>  Significant Diagnostic Tests:  CT Head 10/18 >> negative for acute abnormality  ECHO 10/18> LVEF 58-09% Severe RV systolic dysfunction.  Micro Data:  COVID 10/18 >> negative  BCx2 10/18 >>  GPC 1/4 bottles   Antimicrobials:  Vanco 10/17 >> 10/18  Zosyn 10/18 >>   Interim history/subjective:  Remains HDS and extubated  BiPAP overnight, on [place] now   Objective   Blood pressure (!) 105/52, pulse 80, temperature 98.2 F (36.8 C), temperature source Oral, resp. rate (!) 28, height _0  (1.803 m), weight 124.2 kg, SpO2 100 %.        Intake/Output Summary (Last 24 hours) at 08/22/2019 0914 Last data filed at 08/22/2019 0800 Gross per 24 hour  Intake 536.62 ml  Output 2665 ml  Net -2128.38 ml   Filed Weights   08/21/19 0032 08/21/19 0210  Weight: 122.5 kg 124.2 kg    Examination: General: Chronically ill appearly elderly male, NAD reclined in bd  HEENT: NCAT pink mmm trachea midline anicteric sclera nasal cannula in place  Neuro: AAO following commands  CV: s1s2 no rgm cap refill < 3 seconds  PULM:  Symmetrical chest expansion, no accessory muscle use on [place]. Diminished bibasilar sounds  GI: soft round ndnt normoactive  Extremities: Symmetrical bulk and tone, no cyanosis or clubbing. Trace edema  Skin: c/d/w/i  without rash   Resolved Hospital Problem list   Acute respiratory failure   Assessment & Plan:   PEA Arrest CPR x 12 minute Suspect primary pulmonary as patient was not wearing bipap following recent discharge  EKG unchanged from prior admit, troponin with mild leak / likely demand. CT head negative.  P -rewarmed -Supportive care -Appreciate cardiology input  Acute on chronic systolic and diastolic heart failure LVEF 98-33% Sever RV systolic dysfunction  P -Heart Failure consulted.  -Lasix 172m BID -CVP qshift -low sodium diet & 1800 ml fluid restriction -trend UOP, weights -monitor renal indices and electrolytes; replace electrolytes PRN   AKI on CKD3 Baseline sr cr ~ 1.7 P -Trend BMP / urinary output -Anticipate possible increased Cr with aggressive diuresis -Replace electrolytes as indicated -Avoid nephrotoxic agents as able, ensure adequate renal perfusion  AECOPD Wheezing on admit.   Followed by Dr. WMelvyn NovasLBPulm P -continue solumedrol [id number]  -continue pulmicort + brovana  -TID duoneb  -hold home symbicort  -IS, flutter   OSA  Non-compliant with CPAP at home  -qHS BiPAP -continue to stress importance of compliance   PAF Bradycardia Hx HTN, HLD On Eliquis at home.  Episode of bradycardia in ER, suspect respiratory driven / hypercarbia P -plan to change heparin gtt to Eliquis [id number] -PRN metoprolol for SBP >170 -Continue to hold home bisoprolol for now  DM  2 Hgb A1c 7.4 on 10/6.   -Increase lantus to 10 units QD -SSI, resistant scale   Best practice:  Diet:  NPO for now Pain/Anxiety/Delirium protocol (if indicated): Fentanyl drip VAP protocol (if indicated): HOB 30 degrees DVT prophylaxis: Eliquis  GI prophylaxis: Pepcid BID Glucose control: SSI, lantus  Mobility: NA Code Status: Full code  Family Communication: Patient updated at bedside   Disposition: Transfer to progressive   Labs   CBC: Recent Labs  Lab 08/20/19 2253 08/21/19  0017 08/21/19 0158 08/22/19 0231  WBC  --  8.3 8.4 7.3  HGB 11.6* 9.9* 9.8* 8.6*  HCT 34.0* 35.8* 33.9* 28.3*  MCV  --  93.5 91.9 86.5  PLT  --  356 348 628    Basic Metabolic Panel: Recent Labs  Lab 08/20/19 2210 08/20/19 2253 08/21/19 0017 08/21/19 0158 08/22/19 0231  NA 136 132* 135 136 141  K 5.9* 5.0 6.0* 4.5 4.0  CL 90*  --  90* 89* 93*  CO2 36*  --  31 32 34*  GLUCOSE 231*  --  194* 198* 129*  BUN 68*  --  69* 67* 72*  CREATININE 2.90*  --  2.75* 2.53* 2.60*  CALCIUM 8.1*  --  8.3* 8.4* 8.5*  MG 2.5*  --   --  2.4  --   PHOS  --   --   --  5.2*  --    GFR: Estimated Creatinine Clearance: 35 mL/min (A) (by C-G formula based on SCr of 2.6 mg/dL (H)). Recent Labs  Lab 08/21/19 0017 08/21/19 0158 08/21/19 0855 08/22/19 0231  PROCALCITON  --   --  0.74 0.73  WBC 8.3 8.4  --  7.3    Liver Function Tests: Recent Labs  Lab 08/20/19 2210  AST 26  ALT 31  ALKPHOS 172*  BILITOT 0.6  PROT 6.3*  ALBUMIN 3.1*   Recent Labs  Lab 08/20/19 2210  LIPASE 22   No results for input(s): AMMONIA in the last 168 hours.  ABG    Component Value Date/Time   PHART 7.339 (L) 08/20/2019 2253   PCO2ART 66.6 (HH) 08/20/2019 2253   PO2ART 98.0 08/20/2019 2253   HCO3 36.2 (H) 08/20/2019 2253   TCO2 38 (H) 08/20/2019 2253   O2SAT 97.0 08/20/2019 2253     Coagulation Profile: Recent Labs  Lab 08/21/19 0017 08/21/19 0602 08/21/19 1201  INR 1.4* 1.5* 1.6*    Cardiac Enzymes: No results for input(s): CKTOTAL, CKMB, CKMBINDEX, TROPONINI in the last 168 hours.  HbA1C: No results found for: HGBA1C  CBG: Recent Labs  Lab 08/21/19 1638 08/21/19 2027 08/21/19 2353 08/22/19 0414 08/22/19 0836  GLUCAP 233* 206* 144* 125* 253*    Eliseo Gum MSN, AGACNP-BC [place] [phone] If no answer, [phone] 08/22/2019, 11:31 AM

## 2019-08-22 NOTE — Care Management (Signed)
08-22-19 1134 Benefits check submitted for Eliquis. CM will make patient aware of cost once completed. [name] , RN,BSN Case Manager [phone]

## 2019-08-22 NOTE — Progress Notes (Signed)
Rehab Admissions Coordinator Note:  Patient was screened by Cleatrice Burke for appropriateness for an Inpatient Acute Rehab Consult per PT and OT recommendations. .  At this time, we are recommending Inpatient Rehab consult if pt would like to be considered for Cir admit. Please advise.   Cleatrice Burke RN MSN 08/22/2019, 1:44 PM  I can be reached at [phone].

## 2019-08-22 NOTE — Evaluation (Signed)
Occupational Therapy Evaluation Patient Details Name: Nicholas Caldwell MRN: [id number] DOB: November 25, 1945 Today's Date: 08/22/2019    History of Present Illness 73 y/o M admitted on 10/17 after suffering a PEA arrest (required 12 min CPR before ROSC).  Recent admit from 10/6-10/12 for RLL PNA, CHF decompensation and COPD exacerbation. CBG's noted to be >400, weight up 4lbs.   Clinical Impression   PTA patient reports completing transfers without assistance, but limited mobility, ADLs with some assist for LB self care.  Admitted for above and list limited by decreased activity tolerance, generalized weakness and impaired cognition.  He require cueing for problem solving during session. Declined transfers or mobility (as just completed with PT), but requires setup for grooming and UB ADLs, max assist for LB ADLs.  He will benefit from continued OT services while admitted and after dc at CIR level in order to maximize independence and safety with ADls, mobility prior to returning home.      Follow Up Recommendations  CIR;Supervision/Assistance - 24 hour    Equipment Recommendations  None recommended by OT    Recommendations for Other Services       Precautions / Restrictions Precautions Precautions: Fall Precaution Comments: watch O2 Restrictions Weight Bearing Restrictions: No      Mobility Bed Mobility               General bed mobility comments: up in chair  Transfers Overall transfer level: Needs assistance Equipment used: Bilateral platform walker(EVA walker) Transfers: Sit to/from Stand Sit to Stand: Min assist         General transfer comment: pt declined, per PT min A for transfers     Balance                                           ADL either performed or assessed with clinical judgement   ADL Overall ADL's : Needs assistance/impaired     Grooming: Set up;Sitting   Upper Body Bathing: Set up;Sitting   Lower Body Bathing: Moderate  assistance;Sitting/lateral leans   Upper Body Dressing : Set up;Supervision/safety;Sitting   Lower Body Dressing: Maximal assistance;Sitting/lateral leans Lower Body Dressing Details (indicate cue type and reason): decreased functional reach to B LEs, assist with socks/shoes at basline but increased assist required   Toilet Transfer Details (indicate cue type and reason): pt declined, just ambulated with PT            General ADL Comments: pt seated in recliner, declined mobility as pt just finished ambulating with PT (per PT transfers min A); limited by generalized weakness and decreased activity tolearnce     Vision   Vision Assessment?: No apparent visual deficits     Perception     Praxis      Pertinent Vitals/Pain Pain Assessment: Faces Faces Pain Scale: Hurts even more Pain Location: legs and chest Pain Descriptors / Indicators: Sore;Grimacing;Aching Pain Intervention(s): Monitored during session;Repositioned;Patient requesting pain meds-RN notified     Hand Dominance     Extremity/Trunk Assessment Upper Extremity Assessment Upper Extremity Assessment: Generalized weakness RUE Sensation: history of peripheral neuropathy LUE Sensation: history of peripheral neuropathy   Lower Extremity Assessment Lower Extremity Assessment: Defer to PT evaluation RLE Sensation: history of peripheral neuropathy LLE Sensation: history of peripheral neuropathy       Communication Communication Communication: No difficulties   Cognition Arousal/Alertness: Awake/alert Behavior During Therapy: WFL for tasks  assessed/performed Overall Cognitive Status: History of cognitive impairments - at baseline                                 General Comments: pt pleasant and cooperative, noted decreased problem solving and safety awareness at baseline; continue assessment    General Comments  VSS on supplemental O2 via [place]     Exercises     Shoulder Instructions       Home Living Family/patient expects to be discharged to:: Private residence Living Arrangements: Spouse/significant other Available Help at Discharge: Family Type of Home: House Home Access: Stairs to enter Technical brewer of Steps: 1   Home Layout: One level     Bathroom Shower/Tub: Occupational psychologist: [place]: Environmental consultant - 2 wheels;Wheelchair - [employer];Shower seat          Prior Functioning/Environment Level of Independence: Needs assistance  Gait / Transfers Assistance Needed: was doing bed to chair transfers mainly, limited ambulation ADL's / Homemaking Assistance Needed: pt reports he was completing most ADLs, spouse assisting with socks/shoes             OT Problem List: Decreased strength;Decreased activity tolerance;Impaired balance (sitting and/or standing);Decreased cognition;Decreased safety awareness;Decreased knowledge of use of DME or AE;Cardiopulmonary status limiting activity;Pain;Increased edema;Obesity      OT Treatment/Interventions: Self-care/ADL training;Energy conservation;DME and/or AE instruction;Therapeutic activities;Balance training;Patient/family education;Therapeutic exercise    OT Goals(Current goals can be found in the care plan section) Acute Rehab OT Goals Patient Stated Goal: to get stronger OT Goal Formulation: With patient Time For Goal Achievement: 09/05/19 Potential to Achieve Goals: Good  OT Frequency: Min 2X/week   Barriers to D/C:            Co-evaluation              AM-PAC OT "6 Clicks" Daily Activity     Outcome Measure Help from another person eating meals?: None Help from another person taking care of personal grooming?: A Little Help from another person toileting, which includes using toliet, bedpan, or urinal?: A Lot Help from another person bathing (including washing, rinsing, drying)?: A Lot Help from another person to put on and taking off regular  upper body clothing?: A Little Help from another person to put on and taking off regular lower body clothing?: A Lot 6 Click Score: 16   End of Session Equipment Utilized During Treatment: Oxygen Nurse Communication: Mobility status  Activity Tolerance: Patient tolerated treatment well Patient left: in chair;with call bell/phone within reach;with family/visitor present  OT Visit Diagnosis: Other abnormalities of gait and mobility (R26.89);Muscle weakness (generalized) (M62.81);Pain Pain - Right/Left: (bil) Pain - part of body: (legs, chest)                Time: 4401-0272 OT Time Calculation (min): 16 min Charges:  OT General Charges $OT Visit: 1 Visit OT Evaluation $OT Eval Moderate Complexity: [place], OT Acute Rehabilitation Services Pager [phone] Office [phone]   Delight Stare 08/22/2019, 12:45 PM

## 2019-08-22 NOTE — Progress Notes (Signed)
ANTICOAGULATION CONSULT NOTE - Follow Up Consult  Pharmacy Consult for heparin Indication: atrial fibrillation  Labs: Recent Labs    08/20/19 2210  08/21/19 0017 08/21/19 0158 08/21/19 0602 08/21/19 1201 08/21/19 1835 08/21/19 1921 08/22/19 0231  HGB  --    < > 9.9* 9.8*  --   --   --   --  8.6*  HCT  --    < > 35.8* 33.9*  --   --   --   --  28.3*  PLT  --   --  356 348  --   --   --   --  338  APTT  --    < > 29  --   --  72* 141* 122* 126*  LABPROT  --   --  17.3*  --  18.1* 18.6*  --   --   --   INR  --   --  1.4*  --  1.5* 1.6*  --   --   --   HEPARINUNFRC  --   --   --   --  >2.20*  --   --   --  >2.20*  CREATININE 2.90*  --  2.75* 2.53*  --   --   --   --   --   TROPONINIHS 52*  --  75* 127*  --   --   --   --   --    < > = values in this interval not displayed.    Assessment: 73yo male remains supratherapeutic on heparin with higher PTT despite rate decrease; no gtt issues or overtsigns of bleeding per RN though Hgb is trending down.  Goal of Therapy:  Heparin level 66-102 units/ml   Plan:  Will decrease heparin gtt by 2 units/kgABW/hr to 900 units/hr and check PTT in 8 hours.    Wynona Neat, PharmD, BCPS  08/22/2019,5:53 AM

## 2019-08-22 NOTE — Progress Notes (Signed)
Orthopedic Tech Progress Note Patient Details:  Nicholas Caldwell 03-Sep-1946 [id number] Washed patients legs and then applied a little lotion to leg and then applied unna boots to patient and clean socks. Ortho Devices Type of Ortho Device: Haematologist Ortho Device/Splint Location: bilateral Ortho Device/Splint Interventions: Adjustment, Application, Ordered   Post Interventions Patient Tolerated: Well Instructions Provided: Care of device, Adjustment of device   [name] 08/22/2019, 12:19 PM

## 2019-08-22 NOTE — Progress Notes (Signed)
Assisted tele visit to patient with family member.  Alver Sorrow, RN

## 2019-08-22 NOTE — Plan of Care (Signed)
Pt resting in bed. Dr. Elsworth Soho at bedside. Pt progressing well. Pt ordered up to chair and d/c foley

## 2019-08-22 NOTE — TOC Benefit Eligibility Note (Signed)
Transition of Care [facility]) Benefit Eligibility Note    Patient Details  Name: Nicholas Caldwell MRN: [id number] Date of Birth: 1945/12/27   Medication/Dose: apixaban (ELIQUIS) tablet 5 mg  :  Dose 5 mg  :  Oral  :  2 times daily  Covered?: No  Tier: Other  Prescription Coverage Preferred Pharmacy: Patient has not prescription Coverage  Spoke with Person/Company/Phone Number:: [name] /[phone]/ [employer]  Co-Pay: None  Prior Approval: No  Deductible: (None)       Nicholas Caldwell Phone Number: 08/22/2019, 4:11 PM

## 2019-08-22 NOTE — Progress Notes (Signed)
PHARMACY - PHYSICIAN COMMUNICATION CRITICAL VALUE ALERT - BLOOD CULTURE IDENTIFICATION (BCID)  Merick Kelleher is an 73 y.o. male who presented to [facility] on 08/20/2019 with a chief complaint of PEA arrest  Assessment:  1/4 BC gpc, no BCID  Name of physician (or Provider) Contacted: Sr  Sommer  Current antibiotics: zosyn  Changes to prescribed antibiotics recommended:  none No results found for this or any previous visit.  Excell Seltzer Poteet 08/22/2019  5:04 AM

## 2019-08-22 NOTE — Consult Note (Addendum)
Advanced Heart Failure Team Consult Note   Primary Physician: [name], MD PCP-Cardiologist:  [name]   Reason for Consultation: acute on chronic biventricular failure   HPI:    Nicholas Caldwell is seen today for evaluation of acute on chronic biventricular heart failure, at the request of Dr. Elsworth Soho, Critical Care Medicine.   Nicholas Caldwell is a 73 y.o. male with a history of chronic systolic CHFdue to NICM (EF 40-45%)with moderate RV failure, COPD on 3 L O2, DM, HTN, OHS/OSA on CPAP, PAF s/p DCCV, hyperlipidemia, CAD, and noncompliance.He has had multiple admissions due to HF and respiratory failure.   Admitted from 03/07/19-03/31/19 with massive volume overload with severe RV failure, AFL and cardiorenal syndrome. Attempts at diuresis limited by worsening creatinine which peaked at 2.6. Treated with milrinone but still unable to get CVP below 15. Hospital course c/b severe non-compliance with high fluid intake, PAFL, penile wound, acute versus subacute CVA and Citrobacter UTI. Underwent DC-CV of AFL into NSR.  D/c'd home on 5/28 with weight of 270 and creatinine 1.99. Readmitted6/6/2020with recurrent hypoxic/hypercarbic respiratory failure. (ABG 7.27/66/55/86%). Found to have severe hyperkalemia (K>7.5) and large left pleural effusion. Weightwasup about 10 pounds since d/c.Diuresed with IV lasix and later transitioned to torsemide 80 mg twice a day.  Discharge weight on 04/14/2019 was 251 pounds.  Admitted 04/04/94 with A/C systolic/diastolic heart failure. Bisoprolol was stopped on admit and not continued on discharge. Diuresed with IV lasix and transitioned back to torsemide 80 mg twice a day + metolazone 4 times a week.   Admitted 12/12/39 with A/C systolic/diastolic heart failure + AECOPD. Diuresed with IV lasix and discharged on torsemide 80 mg twice a day + metolazone 4x a week. Treated with steroids and antibiotics. He was placed on low dose bisoprolol.  Palliative Care  consulted. MOST form was introduced. He wished to continue full code.   His most recent admission was earlier this month for RLL PNA (treated with ceftriaxone), CHF exacerbation, and right pleural effusion.  He had right-sided thoracentesis. Discharge SCr on 10/12 was 1.70. discharge wt was 256 lb. He had post hospital f/u w/ [name] on 10/15 and was volume overloaded. Torsemide increased to 100 bid and he was instructed to continue metolazone 4 times/week w/ plans to return in 1 week for f/u and repeat BMP.   10/17, pt was brought into the ED by EMS after witnessed PEA arrest at home. He was resucitated by EMS w/ CPR x 12 min + 1 dose of epi. Intubated in the field. Placed on cooling protocol on arrival. The cause of the arrest is not clear but suspected to be likely driven by respiratory etiology in the setting of severe underlying pulmonary disease and reported increased opiate use and noncompliance w/ Bipap. Admit EKG negative for ischemia and no preceding CP symptoms. Initial Hs tropo 127. Post arrest, he had transient atrial arrthymia followed by junctional rhythm. He was seen by Dr. Rayann Heman who recommended further monitoring. No further EP w/u indicated. His home bisoprolol remains on hold. Pt has been successfully extubated. AHF clinic now consulted to assist w/ acute on chronic biventricular failure. He is massively volume overloaded w/ 3+ bilateral LEE. Wt is up at 273 lb today (previous d/c wt 256 lb). Scr 2.6 (2.90 on admit). Echo dong 10/18 showed LVEF at 60-65% with severely reduced right ventricular systolic function.   Echo 08/21/19 1. RV is severely dilated with severely reduced function. When compared visually with the prior echocardiogram (  07/08/2019) there is no significant change. 2. Global right ventricle has severely reduced systolic function.The right ventricular size is severely enlarged. No increase in right ventricular wall thickness. 3. Left ventricular ejection fraction, by  visual estimation, is 60 to 65%. The left ventricle has normal function. Small left ventricular size. There is no left ventricular hypertrophy. 4. Right ventricular pressure overload. 5. RVSP likely at least moderately elevated. Full assessment not able to performed due to ventilator therapy. 6. Right atrial size was severely dilated. 7. Left atrial size was normal. 8. Left ventricular diastolic Doppler parameters are indeterminate pattern of LV diastolic filling. 9. The mitral valve is grossly normal. Trace mitral valve regurgitation. 10. The tricuspid valve is grossly normal. Tricuspid valve regurgitation mild-moderate. 11. The aortic valve is tricuspid Aortic valve regurgitation was not visualized by color flow Doppler. Structurally normal aortic valve, with no evidence of sclerosis or stenosis. 12. The pulmonic valve was grossly normal. Pulmonic valve regurgitation is not visualized by color flow Doppler. 13. The inferior vena cava IVC is dilated due to ventilator therapy.  Review of Systems: [y] = yes, _0  = no   . General: Weight gain _1 ; Weight loss _2 ; Anorexia _3 ; Fatigue _4 ; Fever _5 ; Chills _6 ; Weakness _7   . Cardiac: Chest pain/pressure _8 ; Resting SOB _9 ; Exertional SOB _10 ; Orthopnea _11 ; Pedal Edema _12 ; Palpitations _13 ; Syncope _14 ; Presyncope _15 ; Paroxysmal nocturnal dyspnea_16   . Pulmonary: Cough _17 ; Wheezing_18 ; Hemoptysis_19 ; Sputum _20 ; Snoring _21   . GI: Vomiting_22 ; Dysphagia_23 ; Melena_24 ; Hematochezia _25 ; Heartburn_26 ; Abdominal pain _27 ; Constipation _28 ; Diarrhea _29 ; BRBPR _30   . GU: Hematuria_31 ; Dysuria _32 ; Nocturia_33   . Vascular: Pain in legs with walking _34 ; Pain in feet with lying flat _35 ; Non-healing sores _36 ; Stroke _37 ; TIA _38 ; Slurred speech _39 ;  . Neuro: Headaches_40 ; Vertigo_41 ; Seizures_42 ; Paresthesias_43 ;Blurred vision _44 ; Diplopia _45 ; Vision changes _46   . Ortho/Skin: Arthritis _47 ; Joint pain _48 ; Muscle pain _49 ; Joint  swelling _50 ; Back Pain _51 ; Rash _52   . Psych: Depression_53 ; Anxiety_54   . Heme: Bleeding problems _55 ; Clotting disorders _56 ; Anemia _57   . Endocrine: Diabetes _58 ; Thyroid dysfunction_59   Home Medications Prior to Admission medications   Medication Sig Start Date End Date Taking? Authorizing Provider  BIDIL 20-37.5 MG tablet Take 1 tablet by mouth daily. 03/31/19  Yes [provider]  bisoprolol (ZEBETA) 5 MG tablet Take 2.5 mg by mouth daily. 03/31/19  Yes [provider]  FEROSUL 325 (65 Fe) MG tablet Take 325 mg by mouth 2 (two) times daily. 03/31/19  Yes [provider]  metolazone (ZAROXOLYN) 2.5 MG tablet Take 2.5 mg by mouth every Tuesday, Thursday, Saturday, and Sunday. 05/18/19  Yes [provider]  oxybutynin (DITROPAN) 5 MG tablet Take 5 mg by mouth 4 (four) times daily. 06/13/19  Yes [provider]  oxyCODONE-acetaminophen (PERCOCET) 10-325 MG tablet Take 1 tablet by mouth 4 (four) times daily -  before meals and at bedtime. 07/25/19  Yes [provider]  potassium chloride SA (KLOR-CON) 20 MEQ tablet Take 60-80 mEq by mouth See admin instructions. Take 60 meq by mouth twice daily all days except on days taking Metolazone take 60 meq in the  morning and 80 meq in the evening. 03/31/19  Yes [provider]  rosuvastatin (CRESTOR) 20 MG tablet Take 20 mg by mouth at bedtime. 03/31/19  Yes [provider]  spironolactone (ALDACTONE) 25 MG tablet Take 25 mg by mouth daily. 03/31/19  Yes [provider]  torsemide (DEMADEX) 20 MG tablet Take 100 mg by mouth 2 (two) times daily. 07/05/19  Yes [provider]    Past Medical History: History reviewed. No pertinent past medical history.  Past Surgical History: History reviewed. No pertinent surgical history.  Family History: No family history on file.  Social History: Social History   Socioeconomic History  . Marital status: Married    Spouse  name: Not on file  . Number of children: Not on file  . Years of education: Not on file  . Highest education level: Not on file  Occupational History  . Not on file  Social Needs  . Financial resource strain: Not on file  . Food insecurity    Worry: Not on file    Inability: Not on file  . Transportation needs    Medical: Not on file    Non-medical: Not on file  Tobacco Use  . Smoking status: Not on file  Substance and Sexual Activity  . Alcohol use: Not on file  . Drug use: Not on file  . Sexual activity: Not on file  Lifestyle  . Physical activity    Days per week: Not on file    Minutes per session: Not on file  . Stress: Not on file  Relationships  . Social Herbalist on phone: Not on file    Gets together: Not on file    Attends religious service: Not on file    Active member of club or organization: Not on file    Attends meetings of clubs or organizations: Not on file    Relationship status: Not on file  Other Topics Concern  . Not on file  Social History Narrative  . Not on file    Allergies:  No Known Allergies  Objective:    Vital Signs:   Temp:  [97.4 F (36.3 C)-98.4 F (36.9 C)] 98 F (36.7 C) (10/19 0400) Pulse Rate:  [50-86] 65 (10/19 0700) Resp:  [15-34] 16 (10/19 0700) BP: (100-158)/(50-108) 118/65 (10/19 0700) SpO2:  [90 %-100 %] 95 % (10/19 0700) FiO2 (%):  [40 %] 40 % (10/18 0800)    Weight change: Filed Weights   08/21/19 0032 08/21/19 0210  Weight: 122.5 kg 124.2 kg    Intake/Output:   Intake/Output Summary (Last 24 hours) at 08/22/2019 0725 Last data filed at 08/22/2019 0600 Gross per 24 hour  Intake 639.19 ml  Output 2590 ml  Net -1950.81 ml      Physical Exam    General:  Obese elderly AAM. No resp difficulty. On bipap  HEENT: normal Neck: rt IJ . Carotids 2+ bilat; no bruits. No lymphadenopathy or thyromegaly appreciated. Cor: PMI nondisplaced. Regular rate & rhythm. No rubs, gallops or murmurs. Lungs:  clear Abdomen: obese, soft, nontender, nondistended. No hepatosplenomegaly. No bruits or masses. Good bowel sounds. Extremities: no cyanosis, clubbing, rash, 3+ bilateral LEE Neuro: alert & orientedx3, cranial nerves grossly intact. moves all 4 extremities w/o difficulty. Affect pleasant    Telemetry   NSR 80s. No further junctional bradycardia   EKG    08/20/19 Sinus tach 142 bpm, bi atrial enlargement   Labs   Basic Metabolic Panel: Recent  Labs  Lab 08/20/19 2210 08/20/19 2253 08/21/19 0017 08/21/19 0158 08/22/19 0231  NA 136 132* 135 136 141  K 5.9* 5.0 6.0* 4.5 4.0  CL 90*  --  90* 89* 93*  CO2 36*  --  31 32 34*  GLUCOSE 231*  --  194* 198* 129*  BUN 68*  --  69* 67* 72*  CREATININE 2.90*  --  2.75* 2.53* 2.60*  CALCIUM 8.1*  --  8.3* 8.4* 8.5*  MG 2.5*  --   --  2.4  --   PHOS  --   --   --  5.2*  --     Liver Function Tests: Recent Labs  Lab 08/20/19 2210  AST 26  ALT 31  ALKPHOS 172*  BILITOT 0.6  PROT 6.3*  ALBUMIN 3.1*   Recent Labs  Lab 08/20/19 2210  LIPASE 22   No results for input(s): AMMONIA in the last 168 hours.  CBC: Recent Labs  Lab 08/20/19 2253 08/21/19 0017 08/21/19 0158 08/22/19 0231  WBC  --  8.3 8.4 7.3  HGB 11.6* 9.9* 9.8* 8.6*  HCT 34.0* 35.8* 33.9* 28.3*  MCV  --  93.5 91.9 86.5  PLT  --  356 348 338    Cardiac Enzymes: No results for input(s): CKTOTAL, CKMB, CKMBINDEX, TROPONINI in the last 168 hours.  BNP: BNP (last 3 results) Recent Labs    08/20/19 2210  BNP 4,121.7*    ProBNP (last 3 results) No results for input(s): PROBNP in the last 8760 hours.   CBG: Recent Labs  Lab 08/21/19 1205 08/21/19 1638 08/21/19 2027 08/21/19 2353 08/22/19 0414  GLUCAP 76 233* 206* 144* 125*    Coagulation Studies: Recent Labs    08/21/19 0017 08/21/19 0602 08/21/19 1201  LABPROT 17.3* 18.1* 18.6*  INR 1.4* 1.5* 1.6*     Imaging   Dg Chest Port 1 View  Result Date: 08/22/2019 CLINICAL DATA:   Respiratory failure EXAM: PORTABLE CHEST 1 VIEW COMPARISON:  Yesterday FINDINGS: Right IJ line with tip at the SVC. Hazy appearance of the bilateral chest from layering pleural effusions and atelectasis by CT. Pulmonary vascular congestion that has likely improved. Marked cardiomegaly. No pneumothorax. IMPRESSION: Extubation with unchanged hazy appearance of the chest from layering effusions and atelectasis based on most recent chest CT. Vascular congestion which has likely improved. Electronically Signed   By: Monte Fantasia M.D.   On: 08/22/2019 06:07      Medications:     Current Medications: . arformoterol  15 mcg Nebulization BID  . budesonide (PULMICORT) nebulizer solution  0.5 mg Nebulization BID  . chlorhexidine gluconate (MEDLINE KIT)  15 mL Mouth Rinse BID  . Chlorhexidine Gluconate Cloth  6 each Topical Daily  . furosemide  40 mg Intravenous Q12H  . gabapentin  300 mg Oral BID  . insulin aspart  0-20 Units Subcutaneous Q4H  . insulin glargine  10 Units Subcutaneous QHS  . ipratropium-albuterol  3 mL Nebulization TID  . methylPREDNISolone (SOLU-MEDROL) injection  40 mg Intravenous Q24H  . sodium chloride flush  10-40 mL Intracatheter Q12H     Infusions: . sodium chloride    . famotidine (PEPCID) IV Stopped (08/22/19 0024)  . heparin 900 Units/hr (08/22/19 0600)  . piperacillin-tazobactam (ZOSYN)  IV Stopped (08/22/19 0431)       Patient Profile   Nicholas Caldwell is a 73 y.o. male with a history of chronic systolic CHFdue to NICM (EF 40-45%)with moderate RV failure, COPD on 3 L  O2, DM, HTN, OHS/OSA on CPAP, PAF s/p DCCV, hyperlipidemia, CAD, noncompliance, multiple admissions over the last 6 months due to HF and respiratory failure, readmitted after out of hospital PEA arrest. AHF consulted for management of acute on chronic biventricular failure (predominant RV failure), at the request of Dr. Elsworth Soho, Critical Care Medicine.   Assessment/Plan   1. Acute on Chronic  Systolic => diastolic CHF with prominent RV failure: likely related to COPD/OSA/OHS. TEE in 3/20 with EF 40-45%, severely dilated RV with moderately decreased systolic function. However, repeat echo in 9/20 showed EF >65% with moderate RV dilation/mildly decreased RV systolic function. 2D echo 10/20 w/ EF 60-65% with severely reduced right ventricular systolic function. He is markedly volume overloaded w/ 3+ bilateral LEE. Wt up at 273 lb (previous dry d/c wt 256 lb 08/15/19). SCr up at 2.6 (previously 1.7) -Will increase IV Lasix to 120 mg bid (home regimen = torsemide 100 mg bid + metolazone 2.5 4x/week) -He has a rt IJ. Will order CVPs q shift -Closely monitor renal function and electrolytes w/ diuresis>>daily BMPs -Daily Wts  -low sodium diet + fluid restrict <1800 mL/ day -Will add unna boots    2. PEA Arrest: He was resucitated by EMS w/ CPR x 12 min + 1 dose of epi. Intubated in the field. Placed on cooling protocol on arrival. The cause of the arrest is not clear but suspected to be likely driven by respiratory etiology in the setting of severe underlying pulmonary disease and reported increased opiate use and noncompliance w/ Bipap. Do no suspect ACS. No preceding CP and admit EKG w/o ischemia. Hs trop trend 52>>75>>127. He has been extubated.   2. Tachy/Brady: h/o PAF. He had DCCV in 3/20 and again in 5/20.  He failed amiodarone in the past and cannot take Tikosyn due to baseline prolonged QT interval.  He has transient atrial arrhythmias followed by junctional rhythm post PEA arrest that have resolved. Currently NSR on tele HR 80s. -currently on IV heparin. No indication at this time for interventions. Plan to place back on Eliquis, 5 mg bid   -continue to monitor on tele -keep K >4 and Mg > 2 -Will continue to hold home bisoprolol for now   3. Acute on Chronic Kidney Disease: stage III CKD. SCr 2.6 (1.70 on 10/12) -massively volume overloaded.  -Increase IV lasix to 120 mg bid   -monitor closely w/ diuresis   3. COPD: Dr. Opal Sidles. Management per primary team   4.OHS/OSA: continue CPAP   5.CAD: Nonobstructive disease in 2/20. No recent CP - Continue crestor 20 mg daily. - no ASA due to Eliquis  6. Anemia: Hgb down from 11.6>>9.9>>8.6  ? Etiology. ? GI w/u. ? FOBT. Will defer to primary team. Monitor closely.     Length of Stay: 2  Lyda Jester, PA-C  08/22/2019, 7:25 AM  Advanced Heart Failure Team Pager [phone] (M-F; 7a - 4p)  Please contact [facility] for night-coverage after hours (4p -7a ) and weekends on [email or web address]  Agree with above. 73 y/o with morbid obesity, diastolic HF, chronic respiratory failure due to OSA/OHS, end-stage cor pulmonale and CKD 3-4. Admitted from home with PEA arrest with OOH CPR ~ 12 mins. Has since undergone therapeutic hypothermia. Now rewarmed and extubated. He wil not talk to me much but will nod head at times and has been talking to wife. Creatinine up but improving. Massive volume overload on exam.   General:  Obese male sitting up in bed. Awake. Will  follow some commands but will not speak to me. Goes back to sleep  HEENT: normal Neck: supple. Thick + RIJ TLC with bloody dressing. JVP to jaw. Carotids 2+ bilat; no bruits. No lymphadenopathy or thryomegaly appreciated. Cor: PMI nondisplaced. Irr irregular No rubs, gallops or murmurs. Lungs: mild wheeze Abdomen: markedly obese soft, nontender, + distended. No hepatosplenomegaly. No bruits or masses. Good bowel sounds. Extremities: no cyanosis, clubbing, rash, 3+ edema with UNNA boots Neuro: Awake. Will follow some commands but will not speak to me. Goes back to sleep    Agree that PEA arrest likely hypoxia mediated in setting of recurrent volume overload. Given his severe underlying RV failure it is a near miracle that he survived his arrest. Neurologically he seems intact according to his wife at bedside. He has AKI but seems to be recovering. Rhythm  appears to be sinus with PACs. Agree with increasing lasix. Will likely need gtt. CVP 14. Will check co-ox. No need for pressors or inotropes currently. Currently with coffee-grounds in NG container. HGb dropping slowly. Follow CBC. The HF team will follow.   CRITICAL CARE Performed by: Glori Bickers  Total critical care time: 35 minutes  Critical care time was exclusive of separately billable procedures and treating other patients.  Critical care was necessary to treat or prevent imminent or life-threatening deterioration.  Critical care was time spent personally by me (independent of midlevel providers or residents) on the following activities: development of treatment plan with patient and/or surrogate as well as nursing, discussions with consultants, evaluation of patient's response to treatment, examination of patient, obtaining history from patient or surrogate, ordering and performing treatments and interventions, ordering and review of laboratory studies, ordering and review of radiographic studies, pulse oximetry and re-evaluation of patient's condition.  Glori Bickers, MD  4:09 PM

## 2019-08-23 ENCOUNTER — Inpatient Hospital Stay (HOSPITAL_COMMUNITY): Payer: Medicare Other

## 2019-08-23 ENCOUNTER — Telehealth (HOSPITAL_COMMUNITY): Payer: Self-pay | Admitting: *Deleted

## 2019-08-23 ENCOUNTER — Encounter (HOSPITAL_COMMUNITY): Payer: Self-pay

## 2019-08-23 ENCOUNTER — Other Ambulatory Visit: Payer: Self-pay

## 2019-08-23 DIAGNOSIS — J441 Chronic obstructive pulmonary disease with (acute) exacerbation: Secondary | ICD-10-CM | POA: Diagnosis not present

## 2019-08-23 DIAGNOSIS — I469 Cardiac arrest, cause unspecified: Secondary | ICD-10-CM | POA: Diagnosis not present

## 2019-08-23 DIAGNOSIS — E876 Hypokalemia: Secondary | ICD-10-CM | POA: Diagnosis not present

## 2019-08-23 DIAGNOSIS — I5043 Acute on chronic combined systolic (congestive) and diastolic (congestive) heart failure: Secondary | ICD-10-CM

## 2019-08-23 DIAGNOSIS — Z01818 Encounter for other preprocedural examination: Secondary | ICD-10-CM

## 2019-08-23 DIAGNOSIS — Z7189 Other specified counseling: Secondary | ICD-10-CM

## 2019-08-23 DIAGNOSIS — I5033 Acute on chronic diastolic (congestive) heart failure: Secondary | ICD-10-CM | POA: Diagnosis not present

## 2019-08-23 DIAGNOSIS — J9602 Acute respiratory failure with hypercapnia: Secondary | ICD-10-CM

## 2019-08-23 LAB — BASIC METABOLIC PANEL
Anion gap: 13 (ref 5–15)
BUN: 70 mg/dL — ABNORMAL HIGH (ref 8–23)
CO2: 39 mmol/L — ABNORMAL HIGH (ref 22–32)
Calcium: 8.8 mg/dL — ABNORMAL LOW (ref 8.9–10.3)
Chloride: 93 mmol/L — ABNORMAL LOW (ref 98–111)
Creatinine, Ser: 2.6 mg/dL — ABNORMAL HIGH (ref 0.61–1.24)
GFR calc Af Amer: 28 mL/min — ABNORMAL LOW (ref >60)
GFR calc non Af Amer: 24 mL/min — ABNORMAL LOW (ref >60)
Glucose, Bld: 106 mg/dL — ABNORMAL HIGH (ref 70–99)
Potassium: 3.3 mmol/L — ABNORMAL LOW (ref 3.5–5.1)
Sodium: 145 mmol/L (ref 135–145)

## 2019-08-23 LAB — POCT I-STAT 7, (LYTES, BLD GAS, ICA,H+H)
Acid-Base Excess: 16 mmol/L — ABNORMAL HIGH (ref 0.0–2.0)
Bicarbonate: 44.4 mmol/L — ABNORMAL HIGH (ref 20.0–28.0)
Calcium, Ion: 1.15 mmol/L (ref 1.15–1.40)
HCT: 33 % — ABNORMAL LOW (ref 39.0–52.0)
Hemoglobin: 11.2 g/dL — ABNORMAL LOW (ref 13.0–17.0)
O2 Saturation: 98 %
Patient temperature: 97.9
Potassium: 3.9 mmol/L (ref 3.5–5.1)
Sodium: 144 mmol/L (ref 135–145)
TCO2: 47 mmol/L — ABNORMAL HIGH (ref 22–32)
pCO2 arterial: 74.8 mmHg (ref 32.0–48.0)
pH, Arterial: 7.38 (ref 7.350–7.450)
pO2, Arterial: 110 mmHg — ABNORMAL HIGH (ref 83.0–108.0)

## 2019-08-23 LAB — BLOOD GAS, ARTERIAL
Acid-Base Excess: 16 mmol/L — ABNORMAL HIGH (ref 0.0–2.0)
Bicarbonate: 45.5 mmol/L — ABNORMAL HIGH (ref 20.0–28.0)
Drawn by: 517021
FIO2: 1
O2 Saturation: 90.4 %
Patient temperature: 98.6
pH, Arterial: 7.123 — CL (ref 7.350–7.450)
pO2, Arterial: 74.5 mmHg — ABNORMAL LOW (ref 83.0–108.0)

## 2019-08-23 LAB — CBC
HCT: 30.5 % — ABNORMAL LOW (ref 39.0–52.0)
Hemoglobin: 9 g/dL — ABNORMAL LOW (ref 13.0–17.0)
MCH: 26.6 pg (ref 26.0–34.0)
MCHC: 29.5 g/dL — ABNORMAL LOW (ref 30.0–36.0)
MCV: 90.2 fL (ref 80.0–100.0)
Platelets: 349 10*3/uL (ref 150–400)
RBC: 3.38 MIL/uL — ABNORMAL LOW (ref 4.22–5.81)
RDW: 20.7 % — ABNORMAL HIGH (ref 11.5–15.5)
WBC: 7.2 10*3/uL (ref 4.0–10.5)
nRBC: 1.4 % — ABNORMAL HIGH (ref 0.0–0.2)

## 2019-08-23 LAB — CULTURE, BLOOD (ROUTINE X 2)

## 2019-08-23 LAB — GLUCOSE, CAPILLARY
Glucose-Capillary: 104 mg/dL — ABNORMAL HIGH (ref 70–99)
Glucose-Capillary: 112 mg/dL — ABNORMAL HIGH (ref 70–99)
Glucose-Capillary: 136 mg/dL — ABNORMAL HIGH (ref 70–99)
Glucose-Capillary: 147 mg/dL — ABNORMAL HIGH (ref 70–99)
Glucose-Capillary: 159 mg/dL — ABNORMAL HIGH (ref 70–99)
Glucose-Capillary: 202 mg/dL — ABNORMAL HIGH (ref 70–99)

## 2019-08-23 LAB — COOXEMETRY PANEL
Carboxyhemoglobin: 1.4 % (ref 0.5–1.5)
Methemoglobin: 0.6 % (ref 0.0–1.5)
O2 Saturation: 83.3 %
Total hemoglobin: 8.7 g/dL — ABNORMAL LOW (ref 12.0–16.0)

## 2019-08-23 LAB — MAGNESIUM: Magnesium: 2.3 mg/dL (ref 1.7–2.4)

## 2019-08-23 LAB — PHOSPHORUS: Phosphorus: 4.5 mg/dL (ref 2.5–4.6)

## 2019-08-23 LAB — PROCALCITONIN: Procalcitonin: 0.41 ng/mL

## 2019-08-23 MED ORDER — PREDNISONE 20 MG PO TABS
40.0000 mg | ORAL_TABLET | Freq: Every day | ORAL | Status: DC
  Filled 2019-08-23: qty 2

## 2019-08-23 MED ORDER — FENTANYL CITRATE (PF) 100 MCG/2ML IJ SOLN
25.0000 ug | INTRAMUSCULAR | Status: DC | PRN
  Administered 2019-08-25: 100 ug via INTRAVENOUS
  Filled 2019-08-23: qty 2

## 2019-08-23 MED ORDER — NALOXONE HCL 0.4 MG/ML IJ SOLN: 0.4000 mg | INTRAMUSCULAR | Status: DC | PRN

## 2019-08-23 MED ORDER — NALOXONE HCL 0.4 MG/ML IJ SOLN
INTRAMUSCULAR | Status: AC
  Administered 2019-08-23: 0.4 mg
  Filled 2019-08-23: qty 1

## 2019-08-23 MED ORDER — NALOXONE HCL 0.4 MG/ML IJ SOLN
INTRAMUSCULAR | Status: AC
  Filled 2019-08-23: qty 1

## 2019-08-23 MED ORDER — POTASSIUM CHLORIDE 10 MEQ/50ML IV SOLN
10.0000 meq | INTRAVENOUS | Status: AC
  Administered 2019-08-23 (×3): 10 meq via INTRAVENOUS
  Filled 2019-08-23 (×3): qty 50

## 2019-08-23 MED ORDER — APIXABAN 5 MG PO TABS
5.0000 mg | ORAL_TABLET | Freq: Two times a day (BID) | ORAL | Status: DC
  Administered 2019-08-24 – 2019-08-26 (×5): 5 mg
  Filled 2019-08-23 (×5): qty 1

## 2019-08-23 MED ORDER — ACETAZOLAMIDE 250 MG PO TABS
250.0000 mg | ORAL_TABLET | Freq: Two times a day (BID) | ORAL | Status: DC
  Administered 2019-08-23: 12:00:00 250 mg via ORAL
  Filled 2019-08-23 (×4): qty 1

## 2019-08-23 MED ORDER — MIDAZOLAM HCL 2 MG/2ML IJ SOLN: 0.5000 mg | INTRAMUSCULAR | Status: DC | PRN

## 2019-08-23 MED ORDER — BISOPROLOL FUMARATE 5 MG PO TABS: 2.5000 mg | ORAL_TABLET | Freq: Every day | ORAL | Status: DC

## 2019-08-23 MED ORDER — ETOMIDATE 2 MG/ML IV SOLN
20.0000 mg | Freq: Once | INTRAVENOUS | Status: AC
  Administered 2019-08-23: 20 mg via INTRAVENOUS

## 2019-08-23 MED ORDER — FENTANYL CITRATE (PF) 100 MCG/2ML IJ SOLN: 25.0000 ug | INTRAMUSCULAR | Status: DC | PRN

## 2019-08-23 MED ORDER — IPRATROPIUM-ALBUTEROL 0.5-2.5 (3) MG/3ML IN SOLN
3.0000 mL | Freq: Two times a day (BID) | RESPIRATORY_TRACT | Status: DC
  Administered 2019-08-23 – 2019-08-29 (×13): 3 mL via RESPIRATORY_TRACT
  Filled 2019-08-23 (×13): qty 3

## 2019-08-23 MED ORDER — MIDAZOLAM HCL 2 MG/2ML IJ SOLN
0.5000 mg | INTRAMUSCULAR | Status: DC | PRN
  Filled 2019-08-23: qty 2

## 2019-08-23 NOTE — Progress Notes (Signed)
Pt's wife [name] made aware of of room transfer.

## 2019-08-23 NOTE — Progress Notes (Signed)
NAME:  Nicholas Caldwell, MRN:  [id number], DOB:  09/21/1946, LOS: 3 ADMISSION DATE:  08/20/2019, CONSULTATION DATE:  08/21/19 REFERRING MD:  Audelia Hives , CHIEF COMPLAINT:  NA  Brief History   73 y/o M admitted on 10/17 after suffering a PEA arrest (required 12 min CPR before ROSC).  Recent admit from 10/6-10/12 for RLL PNA, CHF decompensation and COPD exacerbation. CBG's noted to be >400, weight up 4lbs.    Patient was transferred out of ICU 10/20 to floor. At some point early afternoon he received oxycodone and was placed on 5L [place]. O2 sats consistently documented at 98-99%. He became encephalopathic and gradually obtunded. PCCM called for re-consult.  Past Medical History  HTN, PAF - on eliquis, ACD, HfrEF, COPD, OSA  Significant Hospital Events   10/17 Admit post PEA arrest, intubated 10/18 Cooling protocol stopped as pt awake / alert,extubated 10/20 tx to floor, then became obtunded gradually. Intubated and tx back to ICU  Consults:  Cardiology   Procedures:  ETT 10/17 >> 10/18 R IJ TLC 10/17 >>  Significant Diagnostic Tests:  CT Head 10/18 >> negative for acute abnormality  ECHO 10/18> LVEF 19-75% Severe RV systolic dysfunction.   Micro Data:  COVID 10/18 >> negative  BCx2 10/18 >>  GPC 1/4 bottles   Antimicrobials:  Vanco 10/17 >> 10/18  Zosyn 10/18 >>   Interim history/subjective:    Objective   Blood pressure 124/73, pulse (!) 114, temperature 97.9 F (36.6 C), temperature source Oral, resp. rate 20, height _0  (1.803 m), weight 124.2 kg, SpO2 96 %. CVP:  [10 mmHg-22 mmHg] 18 mmHg  FiO2 (%):  [40 %] 40 %   Intake/Output Summary (Last 24 hours) at 08/23/2019 2038 Last data filed at 08/23/2019 1400 Gross per 24 hour  Intake 908.11 ml  Output 2245 ml  Net -1336.89 ml   Filed Weights   08/21/19 0032 08/21/19 0210  Weight: 122.5 kg 124.2 kg    Examination:  General: Elderly appearing overweight male HEENT: [place]/AT, PERRL, pupils 2m.  Neuro: obtunded. No pain  response.  CV: s1s2 no rgm cap refill < 3 seconds  PULM:  Clear bilateral breath sounds. Not protecting airway, pooled postpharyngeal secretions.  GI: Soft, non-tender, non-distended Extremities: ACE wrap to bilateral lower extremities Skin: Grossly intact.   Resolved Hospital Problem list     Assessment & Plan:   Acute hypercarbic respiratory failure: suspected in the setting of opioid administration and decompensated OSA. Does not appear as though he was started on his BiPAP at any point this afternoon while he was presumably becoming progressively lethargic.  - STAT intubation - Full vent support - RASS goal 0 to -1 - PRN sedation only - Follow ABG - CXR pending  PEA Arrest CPR x 12 minute Suspect primary pulmonary as patient was not wearing bipap following recent discharge  EKG unchanged from prior admit, troponin with mild leak / likely demand. CT head negative.  P -rewarmed -Supportive care -Cardiology following  Acute on chronic systolic and diastolic heart failure LVEF 688-32%Sever RV systolic dysfunction  P -Heart Failure following -Diuresis per cards -Strict I&O  AKI on CKD3 Baseline sr cr ~ 1.7 P -Trend BMP  AECOPD -continue solumedrol [id number]  -continue pulmicort + brovana  -TID duoneb  -holding home symbicort   OSA - noncompliant with NIMV at home.  - qHS BiPAP and PRN during waking hours/nap once able to be extubated - take care to not hyper-oxygenate  PAF- On Eliquis at  home.  Episode of bradycardia in ER, suspect respiratory driven / hypercarbia Bradycardia Hx HTN, HLD - continue eliquis - PRN metoprolol for SBP >170 - Continue to hold home bisoprolol for now  DM 2: Hgb A1c 7.4 on 10/6.   - lantus 10 units daily - SSI, resistant scale   Best practice:  Diet:  NPO Pain/Anxiety/Delirium protocol (if indicated): PRN, RASS goal 0 to -1 VAP protocol (if indicated): yes DVT prophylaxis: Eliquis GI prophylaxis: Pepcid BID Glucose control:  SSI, lantus Mobility: NA Code Status: Full code  Family Communication: Wife updated via phone Disposition: ICU  Labs   CBC: Recent Labs  Lab 08/20/19 2253 08/21/19 0017 08/21/19 0158 08/22/19 0231 08/23/19 0454  WBC  --  8.3 8.4 7.3 7.2  HGB 11.6* 9.9* 9.8* 8.6* 9.0*  HCT 34.0* 35.8* 33.9* 28.3* 30.5*  MCV  --  93.5 91.9 86.5 90.2  PLT  --  356 348 338 527    Basic Metabolic Panel: Recent Labs  Lab 08/20/19 2210 08/20/19 2253 08/21/19 0017 08/21/19 0158 08/22/19 0231 08/22/19 0934 08/23/19 0454  NA 136 132* 135 136 141  --  145  K 5.9* 5.0 6.0* 4.5 4.0  --  3.3*  CL 90*  --  90* 89* 93*  --  93*  CO2 36*  --  31 32 34*  --  39*  GLUCOSE 231*  --  194* 198* 129*  --  106*  BUN 68*  --  69* 67* 72*  --  70*  CREATININE 2.90*  --  2.75* 2.53* 2.60*  --  2.60*  CALCIUM 8.1*  --  8.3* 8.4* 8.5*  --  8.8*  MG 2.5*  --   --  2.4  --  2.4 2.3  PHOS  --   --   --  5.2*  --   --  4.5   GFR: Estimated Creatinine Clearance: 35 mL/min (A) (by C-G formula based on SCr of 2.6 mg/dL (H)). Recent Labs  Lab 08/21/19 0017 08/21/19 0158 08/21/19 0855 08/22/19 0231 08/23/19 0454  PROCALCITON  --   --  0.74 0.73 0.41  WBC 8.3 8.4  --  7.3 7.2    Liver Function Tests: Recent Labs  Lab 08/20/19 2210  AST 26  ALT 31  ALKPHOS 172*  BILITOT 0.6  PROT 6.3*  ALBUMIN 3.1*   Recent Labs  Lab 08/20/19 2210  LIPASE 22   No results for input(s): AMMONIA in the last 168 hours.  ABG    Component Value Date/Time   PHART 7.339 (L) 08/20/2019 2253   PCO2ART 66.6 (HH) 08/20/2019 2253   PO2ART 98.0 08/20/2019 2253   HCO3 36.2 (H) 08/20/2019 2253   TCO2 38 (H) 08/20/2019 2253   O2SAT 83.3 08/23/2019 0815     Coagulation Profile: Recent Labs  Lab 08/21/19 0017 08/21/19 0602 08/21/19 1201  INR 1.4* 1.5* 1.6*    Cardiac Enzymes: No results for input(s): CKTOTAL, CKMB, CKMBINDEX, TROPONINI in the last 168 hours.  HbA1C: Hgb A1c MFr Bld  Date/Time Value Ref Range  Status  08/21/2019 12:17 AM 7.4 (H) 4.8 - 5.6 % Final    Comment:    (NOTE)         Prediabetes: 5.7 - 6.4         Diabetes: >6.4         Glycemic control for adults with diabetes: <7.0     CBG: Recent Labs  Lab 08/23/19 0441 08/23/19 0825 08/23/19 1152 08/23/19 1604 08/23/19  [place] care time: 45 mins  Georgann Housekeeper, AGACNP-BC [place] Pager [phone] or [phone]  08/23/2019 9:15 PM

## 2019-08-23 NOTE — Progress Notes (Addendum)
Advanced Heart Failure Rounding Note  PCP-Cardiologist: No primary care provider on file.   Subjective:    Main complaint is chronic rt hip pain. Asking for more pain meds.   No events overnight.   CVP ~20. Responding to IV Lasix. -3L    Objective:   Weight Range: 124.2 kg Body mass index is 38.19 kg/m.   Vital Signs:   Temp:  [96.8 F (36 C)-98.2 F (36.8 C)] 96.8 F (36 C) (10/20 0101) Pulse Rate:  [68-106] 95 (10/20 0700) Resp:  [13-28] 15 (10/20 0700) BP: (93-121)/(51-76) 114/76 (10/20 0700) SpO2:  [90 %-100 %] 99 % (10/20 0700) Last BM Date: (PTA)  Weight change: Filed Weights   08/21/19 0032 08/21/19 0210  Weight: 122.5 kg 124.2 kg    Intake/Output:   Intake/Output Summary (Last 24 hours) at 08/23/2019 0734 Last data filed at 08/23/2019 0600 Gross per 24 hour  Intake 1623.51 ml  Output 3250 ml  Net -1626.49 ml      Physical Exam    PHYSICAL EXAM: General:  Chronically ill appearing AAM. No respiratory difficulty HEENT: normal Neck: supple. elevated JVD. Rt IJ present Carotids 2+ bilat; no bruits. No lymphadenopathy or thyromegaly appreciated. Cor: PMI nondisplaced. Regular rhythm, tachy rate. No rubs, gallops or murmurs. Lungs: clear Abdomen: soft, nontender, nondistended. No hepatosplenomegaly. No bruits or masses. Good bowel sounds. Extremities: no cyanosis, clubbing, rash, 2+ edema, bilateral unna boots Neuro: alert & oriented x 3, cranial nerves grossly intact. moves all 4 extremities w/o difficulty. Affect pleasant.   Telemetry   Tachycardia in the 110s ? Atrial flutter vs fib. - will obtain 12 lead  EKG    12 lead pending   Labs    CBC Recent Labs    08/22/19 0231 08/23/19 0454  WBC 7.3 7.2  HGB 8.6* 9.0*  HCT 28.3* 30.5*  MCV 86.5 90.2  PLT 338 742   Basic Metabolic Panel Recent Labs    08/21/19 0158 08/22/19 0231 08/22/19 0934 08/23/19 0454  NA 136 141  --  145  K 4.5 4.0  --  3.3*  CL 89* 93*  --  93*  CO2  32 34*  --  39*  GLUCOSE 198* 129*  --  106*  BUN 67* 72*  --  70*  CREATININE 2.53* 2.60*  --  2.60*  CALCIUM 8.4* 8.5*  --  8.8*  MG 2.4  --  2.4 2.3  PHOS 5.2*  --   --  4.5   Liver Function Tests Recent Labs    08/20/19 2210  AST 26  ALT 31  ALKPHOS 172*  BILITOT 0.6  PROT 6.3*  ALBUMIN 3.1*   Recent Labs    08/20/19 2210  LIPASE 22   Cardiac Enzymes No results for input(s): CKTOTAL, CKMB, CKMBINDEX, TROPONINI in the last 72 hours.  BNP: BNP (last 3 results) Recent Labs    08/20/19 2210  BNP 4,121.7*    ProBNP (last 3 results) No results for input(s): PROBNP in the last 8760 hours.   D-Dimer No results for input(s): DDIMER in the last 72 hours. Hemoglobin A1C Recent Labs    08/21/19 0017  HGBA1C 7.4*   Fasting Lipid Panel No results for input(s): CHOL, HDL, LDLCALC, TRIG, CHOLHDL, LDLDIRECT in the last 72 hours. Thyroid Function Tests No results for input(s): TSH, T4TOTAL, T3FREE, THYROIDAB in the last 72 hours.  Invalid input(s): FREET3  Other results:   Imaging     No results found.   Medications:  Scheduled Medications: . apixaban  5 mg Oral BID  . arformoterol  15 mcg Nebulization BID  . budesonide (PULMICORT) nebulizer solution  0.5 mg Nebulization BID  . chlorhexidine gluconate (MEDLINE KIT)  15 mL Mouth Rinse BID  . Chlorhexidine Gluconate Cloth  6 each Topical Daily  . gabapentin  300 mg Oral BID  . insulin aspart  0-20 Units Subcutaneous Q4H  . insulin glargine  10 Units Subcutaneous QHS  . ipratropium-albuterol  3 mL Nebulization BID  . methylPREDNISolone (SOLU-MEDROL) injection  40 mg Intravenous Q24H  . sodium chloride flush  10-40 mL Intracatheter Q12H     Infusions: . sodium chloride Stopped (08/22/19 2109)  . famotidine (PEPCID) IV Stopped (08/22/19 2245)  . furosemide Stopped (08/22/19 2209)  . piperacillin-tazobactam (ZOSYN)  IV Stopped (08/23/19 0452)  . potassium chloride       PRN Medications:   sodium chloride, acetaminophen, metoprolol tartrate, oxyCODONE-acetaminophen **AND** oxyCODONE, sodium chloride flush    Patient Profile   Nicholas Brodenis a 73 y.o.malewith a history of chronic systolic CHFdue to NICM (EF 40-45%)with moderate RV failure, COPD on 3 L O2, DM, HTN, OHS/OSA on CPAP, PAF s/p DCCV, hyperlipidemia, CAD, noncompliance, multiple admissions over the last 6 months due to HF and respiratory failure, readmitted after out of hospital PEA arrest. AHF consulted for management of acute on chronic biventricular failure (predominant RV failure), at the request of Dr. Elsworth Soho, Critical Care Medicine.   Assessment/Plan    1. Acute on Chronic Systolic=> diastolicCHF with prominent RV failure: likely related to COPD/OSA/OHS. TEE in 3/20 with EF 40-45%, severely dilated RV with moderately decreased systolic function. However, repeat echo in 9/20 showed EF >65% with moderate RV dilation/mildly decreased RV systolic function.2D echo 10/20 w/ EF 60-65% with severely reduced right ventricular systolic function. Presented w/ marked volume overload w/ 3+ bilateral LEE. Admit wt up at 273 lb (previous dry d/c wt 256 lb 08/15/19). Admit SCr up at 2.6 (previously 1.7) -Remains volume overloaded. CVP elevated at 20. Responding well to high dose lasix. -3.2L out yesterday. -Will continue IV Lasix 120 mg bid (home regimen = torsemide 100 mg bid + metolazone 2.5 4x/week). CO2 elevated at 39. Will add diamox 250 bid. -Check Co-ox. If low, may need inotrope.  -Closely monitor renal function and electrolytes w/ diuresis>>daily BMPs -Daily Wts  -low sodium diet + fluid restrict <1800 mL/ day -continue w/ unna boots    2. PEA Arrest: He was resucitated by EMS w/ CPR x 12 min + 1 dose of epi. Intubated in the field. Placed on cooling protocol on arrival. The cause of the arrest is not clear but suspected to be likely driven by respiratory etiology in the setting of severe underlying pulmonary  disease and reported increased opiate use and noncompliance w/ Bipap. Do no suspect ACS. No preceding CP and admit EKG w/o ischemia. Hs trop trend 52>>75>>127. He has been extubated.   2. Tachy/Brady: h/o PAF. He had DCCV in 3/20and again in 5/20. He failed amiodarone in the past and cannot take Tikosyn due to baseline prolonged QT interval. He had transient atrial arrhythmias followed by junctional rhythm post PEA arrest.  -? Recurrent atrial fib/flutter on tele. Will order 12 lead EKG to confirm rhythm.- -Continue Eliquis, 5 mg bid   -continue to monitor on tele -keep K >4 and Mg > 2 -Will continue to hold home bisoprolol for now   3. Acute on Chronic Kidney Disease: stage III CKD. SCr 2.6 (1.70 on 10/12) -Scr  stable w/ diuresis, at 2.6 -remains massively volume overloaded.  -Continue IV lasix 120 mg bid. Adding Diamox 250 bid -monitor closely w/ diuresis   3. COPD: Dr. Opal Sidles. Management per primary team   4.OHS/OSA:continue BiPap QHS  5.CAD: Nonobstructive disease in 2/20. No recent CP - Continue crestor 20 mg daily. -no ASA due to Eliquis  6. Anemia: Hgb trending back up, 8.6>>9.0 - Monitor closely.   7. Hypokalemia: K 3.3 in the setting of IV diuretics. Mg 2.3  -supp K ordered 10 mEq IV q1H x 3 -f/u BMP in the AM    Length of Stay: [id number] Ladoris Gene  08/23/2019, 7:34 AM  Advanced Heart Failure Team Pager [phone] (M-F; [place])  Please contact [facility] for night-coverage after hours (4p -7a ) and weekends on [email or web address]  Patient seen with PA, agree with the above note.   Patient is up and alert this morning.  CVP 20, reasonable diuresis yesterday with IV Lasix, creatinine stable today at 2.6.  He is back in atrial fibrillation this morning with HR 90s-100s.   General: NAD Neck: JVP 16+, no thyromegaly or thyroid nodule.  Lungs: Decreased BS at bases.  CV: Nondisplaced PMI.  Heart mildly tachy, irregular S1/S2, no S3/S4, no murmur.   1+ edema to knees with lower legs wrapped.    Abdomen: Soft, nontender, no hepatosplenomegaly, no distention.  Skin: Intact without lesions or rashes.  Neurologic: Alert and oriented x 3.  Psych: Normal affect. Extremities: No clubbing or cyanosis.  HEENT: Normal.   1. Acute on chronic hypoxemic respiratory failure: Baseline COPD and OHS/OSA.  He was not compliant with Bipap at home and was using opiates.  He had a PEA arrest.  Now extubated and stable.  - Continue diuresis.  - Need to try to limit opiates though he is asking for pain meds today.  - Wear O2 during the day and bipap at night.  1. Chronic systolic => diastolic CHF: With prominent RV failure, likely related to COPD/OSA/OHS. TEE in 3/20 with EF 40-45%, severely dilated RV with moderately decreased systolic function. Echo this admission showed EF up to 60-65% but with severely dilated and dysfunctional RV. He has had multiple recent admissions with CHF and COPD exacerbations. He is chronically NYHA class IIIb. He is on high doses of diuretics at home but has remained volume overloaded as outpatient.  Now significantly volume overloaded with intractable RV failure as inpatient.  CVP 20.  Creatinine up to 2.6 but stable so far with diuresis.  - Continue Lasix 120 mg IV bid, will give Diamox 250 mg bid with elevated HCO3.  - Restart low dose bisoprolol 2.5 daily for rate control.  -Bidil on hold with soft BP.  2.Atrial fibrillation: Paroxysmal. He has history of CVA. He had DCCV in 3/20 and again in 5/20.  He failed amiodarone in the past and cannot take Tikosyn due to baseline prolonged QT interval.  He is back in atrial fibrillation this morning with mild RVR.  - Continue apixaban 5 mg bid.   - Poor ablation candidate with LA size.  - Add back bisoprolol 2.5 daily.  3. COPD:No longer smokes. Continue homeoxygen. CT chest showed emphysema. 4.OHS/OSA: Continue 3 L oxygen during the day and CPAP at night.  5.CAD:  Nonobstructive disease in 2/20. No chest pain. - Continue crestor 20 mg daily. - He is on apixaban so no ASA.   6. AKI on CKD Stage 3: Creatinine up to 2.6 but stable  so far with diuresis.  With CVP 20, hopefully creatinine will improve with diuresis.   7. Pleural effusion: S/p recent thoracentesis for right pleural effusion. Effusions remain present.   CRITICAL CARE Performed by: Loralie Champagne  Total critical care time: 40 minutes  Critical care time was exclusive of separately billable procedures and treating other patients.  Critical care was necessary to treat or prevent imminent or life-threatening deterioration.  Critical care was time spent personally by me on the following activities: development of treatment plan with patient and/or surrogate as well as nursing, discussions with consultants, evaluation of patient's response to treatment, examination of patient, obtaining history from patient or surrogate, ordering and performing treatments and interventions, ordering and review of laboratory studies, ordering and review of radiographic studies, pulse oximetry and re-evaluation of patient's condition.  Loralie Champagne 08/23/2019 9:16 AM

## 2019-08-23 NOTE — Progress Notes (Signed)
[facility] Progress Note Patient Name: Nicholas Caldwell DOB: May 24, 1946 MRN: [id number]   Date of Service  08/23/2019  HPI/Events of Note  K+ = 3.3 and Creatinine = 2.6.   eICU Interventions  Will replace K+ cautiously.      Intervention Category Major Interventions: Electrolyte abnormality - evaluation and management  Sommer,Steven Eugene 08/23/2019, 6:49 AM

## 2019-08-23 NOTE — Significant Event (Signed)
Rapid Response Event Note  Overview:Called d/t unresponsiveness. Time Called: 1953 Arrival Time: 1957 Event Type: Neurologic  Initial Focused Assessment: Pt laying in bed with agonal respirations, unresponsive to sternal rub, femoral pulse palpable, pupils 4 and brisk. HR-114, BP-134/75, RR-22, SpO2-96% on 4L [place], CBG-202. Pt placed on NRB 15L, defib pads applied, and code cart placed in room. ABG drawn, narcan 0.31m given X 2 @ 2010 and 2016 with little effectiveness.  Primary and Elink called during interventions. PCCM HHeber  NP and Scatliffe, MD arrived at bedside and emergently intubated pt at 2029. Pt then transferred to [facility]  Interventions: CBG-202 ABG-7.12/above reportable range/74.5/45.5 Narcan 0.410mX 2 Pt intubated by PCCM(2024mtomidate given at 2024 for intubation) Pt transferred to 2H CircleEvent Summary: Name of Physician Notified: KirBaltazar NajjarP at 2000  Name of Consulting Physician Notified: OgaLucile ShuttersCCM), HofHeber CarolinaP at 2005     Event End Time: 2100  [place]illard [place]

## 2019-08-23 NOTE — Telephone Encounter (Signed)
Verbal order given to [facility] for skilled nursing, physical therapy, and recert.   [facility] callback #[phone]

## 2019-08-23 NOTE — Progress Notes (Signed)
Marland Kitchen  PROGRESS NOTE    Nicholas Caldwell  XBL:[id number] DOB: December 28, 1945 DOA: 08/20/2019 PCP: [name], MD   Brief Narrative:   Nicholas Caldwell is a 73 y.o. M with HTN, pAF on Eliquis, ACD, sCHF EF 40-45%, DM with neuropathy, WC bound, COPD on 3L O2, OSA who presneted  Sp c ardiac arrest. Was fiound by wife who notes that Nicholas Caldwell was sleeping like norm al this evening. Not wearing bipap. He took his medicine and pain meds this evening. Wife tried to wake him, but he would not wake up. He would go right back to sleep. After several attempt to awake Nicholas Caldwell she called EMS. EMS went Noted Nicholas Caldwell to be agonal. Than Nicholas Caldwell went into PEA. Got compressions for 12 minutes. Giot 1 of epi. Also intubated for AMS.  Wife also noted Sugar was up to over 400. Ddid not have any cough SOB, Fevers or SOB. Has been taking his medicines as prescribed. Has beden peeing a fair amount has gained about 4 lbs. He did take some pain pills tonight Of note Nicholas Caldwell recently admitted from 10/6-10/1 2Noted to have COPD exacerbation,pna,  and heart failure exacerbation  10/20: Received from ICU ON. Denies complaints this morning. Continuing diuresis.    Assessment & Plan:   Active Problems:   Cardiac arrest (HCC)   Pressure injury of skin   PEA Arrest Acute on chronic systolic and diastolic heart failure     - LVEF 60-65%; Severe RV systolic dysfunction      - HF team onboard, appreciate assistance     - on lasix 1572m IV BID; acetazolamide 2512mBID     - watch I&O, daily wt     - fluid/Na+ restrict  AKI on CKD3     - Baseline SCr is 1.7     - SCr today is 2.6; follow with aggressive diuresis; watch nephrotoxin agents  Hypokalemia     - replace, monitor     - Mg2+ ok  AECOPD     - Wheezing on admit.       - continue pulmicort, brovana, BID duoneb; change solumedrol to prednisone and slow taper      - IS, flutter     - on 5L [place] during interview; denies complaints  OSA Obesity     - Non-compliant with  CPAP at home      - qHS BiPAP  PAF Bradycardia Hx HTN, HLD     - Eliquis 72m82mID     - PRN metoprolol for SBP >170     - Continue to hold home bisoprolol for now  DM 2     - Hgb A1c 7.4 on 10/6.       - lantus 10 units QD; glucose ok this AM     - SSI, resistant scale   DVT prophylaxis: eliquis Code Status: FULL   Disposition Plan: TBD  Consultants:   Cardiology  PCCM  Antimicrobials:  . zosyn   ROS:  Denies N, V, ab pain, dyspnea, CP . Remainder 10-pt ROS is negative for all not previously mentioned.  Subjective: "My heart didn't stop. Wait, it did?"  Objective: Vitals:   08/23/19 0900 08/23/19 1000 08/23/19 1100 08/23/19 1200  BP: 116/72 117/72 122/75 113/71  Pulse: (!) 112 (!) 103 93 (!) 113  Resp: (!) 23 (!) _0 Temp:      TempSrc:      SpO2: 99% 99% 100% 99%  Weight:      Height:  Intake/Output Summary (Last 24 hours) at 08/23/2019 1304 Last data filed at 08/23/2019 1100 Gross per 24 hour  Intake 1566.01 ml  Output 3000 ml  Net -1433.99 ml   Filed Weights   08/21/19 0032 08/21/19 0210  Weight: 122.5 kg 124.2 kg    Examination:  General: 73 y.o. male resting in bed; ill appearing Cardiovascular: RRR, +S1, S2, no m/g/r, increased JVD Respiratory: CTABL, no w/r/r, normal WOB GI: BS+, obese, NT, no masses noted, no organomegaly noted MSK: No c/c; BLE edema Neuro: alert to name, follows commands Psyc: Appropriate interaction and affect, calm/cooperative   Data Reviewed: I have personally reviewed following labs and imaging studies.  CBC: Recent Labs  Lab 08/20/19 2253 08/21/19 0017 08/21/19 0158 08/22/19 0231 08/23/19 0454  WBC  --  8.3 8.4 7.3 7.2  HGB 11.6* 9.9* 9.8* 8.6* 9.0*  HCT 34.0* 35.8* 33.9* 28.3* 30.5*  MCV  --  93.5 91.9 86.5 90.2  PLT  --  356 348 338 937   Basic Metabolic Panel: Recent Labs  Lab 08/20/19 2210 08/20/19 2253 08/21/19 0017 08/21/19 0158 08/22/19 0231 08/22/19 0934 08/23/19 0454   NA 136 132* 135 136 141  --  145  K 5.9* 5.0 6.0* 4.5 4.0  --  3.3*  CL 90*  --  90* 89* 93*  --  93*  CO2 36*  --  31 32 34*  --  39*  GLUCOSE 231*  --  194* 198* 129*  --  106*  BUN 68*  --  69* 67* 72*  --  70*  CREATININE 2.90*  --  2.75* 2.53* 2.60*  --  2.60*  CALCIUM 8.1*  --  8.3* 8.4* 8.5*  --  8.8*  MG 2.5*  --   --  2.4  --  2.4 2.3  PHOS  --   --   --  5.2*  --   --  4.5   GFR: Estimated Creatinine Clearance: 35 mL/min (A) (by C-G formula based on SCr of 2.6 mg/dL (H)). Liver Function Tests: Recent Labs  Lab 08/20/19 2210  AST 26  ALT 31  ALKPHOS 172*  BILITOT 0.6  PROT 6.3*  ALBUMIN 3.1*   Recent Labs  Lab 08/20/19 2210  LIPASE 22   No results for input(s): AMMONIA in the last 168 hours. Coagulation Profile: Recent Labs  Lab 08/21/19 0017 08/21/19 0602 08/21/19 1201  INR 1.4* 1.5* 1.6*   Cardiac Enzymes: No results for input(s): CKTOTAL, CKMB, CKMBINDEX, TROPONINI in the last 168 hours. BNP (last 3 results) No results for input(s): PROBNP in the last 8760 hours. HbA1C: Recent Labs    08/21/19 0017  HGBA1C 7.4*   CBG: Recent Labs  Lab 08/22/19 2028 08/23/19 0050 08/23/19 0441 08/23/19 0825 08/23/19 1152  GLUCAP 139* 104* 112* 136* 147*   Lipid Profile: No results for input(s): CHOL, HDL, LDLCALC, TRIG, CHOLHDL, LDLDIRECT in the last 72 hours. Thyroid Function Tests: No results for input(s): TSH, T4TOTAL, FREET4, T3FREE, THYROIDAB in the last 72 hours. Anemia Panel: No results for input(s): VITAMINB12, FOLATE, FERRITIN, TIBC, IRON, RETICCTPCT in the last 72 hours. Sepsis Labs: Recent Labs  Lab 08/21/19 0855 08/22/19 0231 08/23/19 0454  PROCALCITON 0.74 0.73 0.41    Recent Results (from the past 240 hour(s))  SARS Coronavirus 2 by RT PCR (hospital order, performed in [facility] hospital lab) Nasopharyngeal Nasopharyngeal Swab     Status: None   Collection Time: 08/20/19 10:15 PM   Specimen: Nasopharyngeal Swab  Result Value  Ref  Range Status   SARS Coronavirus 2 NEGATIVE NEGATIVE Final    Comment: (NOTE) If result is NEGATIVE SARS-CoV-2 target nucleic acids are NOT DETECTED. The SARS-CoV-2 RNA is generally detectable in upper and lower  respiratory specimens during the acute phase of infection. The lowest  concentration of SARS-CoV-2 viral copies this assay can detect is 250  copies / mL. A negative result does not preclude SARS-CoV-2 infection  and should not be used as the sole basis for treatment or other  Nicholas Caldwell management decisions.  A negative result may occur with  improper specimen collection / handling, submission of specimen other  than nasopharyngeal swab, presence of viral mutation(s) within the  areas targeted by this assay, and inadequate number of viral copies  (<250 copies / mL). A negative result must be combined with clinical  observations, Nicholas Caldwell history, and epidemiological information. If result is POSITIVE SARS-CoV-2 target nucleic acids are DETECTED. The SARS-CoV-2 RNA is generally detectable in upper and lower  respiratory specimens dur ing the acute phase of infection.  Positive  results are indicative of active infection with SARS-CoV-2.  Clinical  correlation with Nicholas Caldwell history and other diagnostic information is  necessary to determine Nicholas Caldwell infection status.  Positive results do  not rule out bacterial infection or co-infection with other viruses. If result is PRESUMPTIVE POSTIVE SARS-CoV-2 nucleic acids MAY BE PRESENT.   A presumptive positive result was obtained on the submitted specimen  and confirmed on repeat testing.  While 2019 novel coronavirus  (SARS-CoV-2) nucleic acids may be present in the submitted sample  additional confirmatory testing may be necessary for epidemiological  and / or clinical management purposes  to differentiate between  SARS-CoV-2 and other Sarbecovirus currently known to infect humans.  If clinically indicated additional testing with an  alternate test  methodology [phone]) is advised. The SARS-CoV-2 RNA is generally  detectable in upper and lower respiratory sp ecimens during the acute  phase of infection. The expected result is Negative. Fact Sheet for Patients:  StrictlyIdeas.no Fact Sheet for Healthcare Providers: BankingDealers.co.za This test is not yet approved or cleared by the [place] FDA and has been authorized for detection and/or diagnosis of SARS-CoV-2 by FDA under an Emergency Use Authorization (EUA).  This EUA will remain in effect (meaning this test can be used) for the duration of the COVID-19 declaration under Section 564(b)(1) of the Act, 21 U.S.C. section 360bbb-3(b)(1), unless the authorization is terminated or revoked sooner. Performed at [facility], [place] [address], [place], [place] [address]   Blood culture (routine x 2)     Status: Abnormal   Collection Time: 08/20/19 10:35 PM   Specimen: BLOOD RIGHT HAND  Result Value Ref Range Status   Specimen Description BLOOD RIGHT HAND  Final   Special Requests   Final    BOTTLES DRAWN AEROBIC AND ANAEROBIC Blood Culture results may not be optimal due to an inadequate volume of blood received in culture bottles   Culture  Setup Time   Final    AEROBIC BOTTLE ONLY GRAM POSITIVE COCCI CRITICAL RESULT CALLED TO, READ BACK BY AND VERIFIED WITH: LAURA FAYE @ 0500 on 08/22/19 by robinson z.     Culture (A)  Final    STAPHYLOCOCCUS SPECIES (COAGULASE NEGATIVE) THE SIGNIFICANCE OF ISOLATING THIS ORGANISM FROM A SINGLE SET OF BLOOD CULTURES WHEN MULTIPLE SETS ARE DRAWN IS UNCERTAIN. PLEASE NOTIFY THE MICROBIOLOGY DEPARTMENT WITHIN ONE WEEK IF SPECIATION AND SENSITIVITIES ARE REQUIRED. Performed at [facility], [place] [address] [place], [place] [address]    Report Status 08/23/2019 FINAL  Final  Blood culture (routine x 2)     Status: None (Preliminary result)   Collection Time: 08/20/19 10:43  PM   Specimen: BLOOD LEFT HAND  Result Value Ref Range Status   Specimen Description BLOOD LEFT HAND  Final   Special Requests   Final    BOTTLES DRAWN AEROBIC ONLY Blood Culture adequate volume   Culture   Final    NO GROWTH 3 DAYS Performed at [facility], [place] [address] [place], [place] [address]    Report Status PENDING  Incomplete  MRSA PCR Screening     Status: None   Collection Time: 08/21/19  1:57 AM   Specimen: Nasal Mucosa; Nasopharyngeal  Result Value Ref Range Status   MRSA by PCR NEGATIVE NEGATIVE Final    Comment:        The GeneXpert MRSA Assay (FDA approved for NASAL specimens only), is one component of a comprehensive MRSA colonization surveillance program. It is not intended to diagnose MRSA infection nor to guide or monitor treatment for MRSA infections. Performed at [facility], [place] [address] [place],  [address]       Radiology Studies: Dg Chest Port 1 View  Result Date: 08/22/2019 CLINICAL DATA:  Respiratory failure EXAM: PORTABLE CHEST 1 VIEW COMPARISON:  Yesterday FINDINGS: Right IJ line with tip at the SVC. Hazy appearance of the bilateral chest from layering pleural effusions and atelectasis by CT. Pulmonary vascular congestion that has likely improved. Marked cardiomegaly. No pneumothorax. IMPRESSION: Extubation with unchanged hazy appearance of the chest from layering effusions and atelectasis based on most recent chest CT. Vascular congestion which has likely improved. Electronically Signed   By: Monte Fantasia M.D.   On: 08/22/2019 06:07     Scheduled Meds: . acetaZOLAMIDE  250 mg Oral BID  . apixaban  5 mg Oral BID  . arformoterol  15 mcg Nebulization BID  . budesonide (PULMICORT) nebulizer solution  0.5 mg Nebulization BID  . chlorhexidine gluconate (MEDLINE KIT)  15 mL Mouth Rinse BID  . Chlorhexidine Gluconate Cloth  6 each Topical Daily  . gabapentin  300 mg Oral BID  . insulin aspart  0-20 Units Subcutaneous  Q4H  . insulin glargine  10 Units Subcutaneous QHS  . ipratropium-albuterol  3 mL Nebulization BID  . methylPREDNISolone (SOLU-MEDROL) injection  40 mg Intravenous Q24H  . sodium chloride flush  10-40 mL Intracatheter Q12H   Continuous Infusions: . sodium chloride Stopped (08/22/19 2109)  . famotidine (PEPCID) IV 20 mg (08/23/19 1005)  . furosemide 62 mL/hr at 08/23/19 0900  . piperacillin-tazobactam (ZOSYN)  IV 12.5 mL/hr at 08/23/19 0900     LOS: 3 days    Time spent: 25 minutes spent in the coordination of care today.    Jonnie Finner, DO [facility] Pager [phone]  If 7PM-7AM, please contact night-coverage [email or web address] Password [email or web address] 08/23/2019, 1:04 PM

## 2019-08-23 NOTE — Procedures (Signed)
Intubation Procedure Note Nicholas Caldwell [id number] 1946/07/16  Procedure: Intubation Indications: Respiratory insufficiency  Procedure Details Consent: Unable to obtain consent because of altered level of consciousness. Time Out: Verified patient identification, verified procedure, site/side was marked, verified correct patient position, special equipment/implants available, medications/allergies/relevent history reviewed, required imaging and test results available.  Performed  Maximum sterile technique was used including antiseptics, cap, gloves, hand hygiene and mask.  MAC and 4  RSI: Etomidate 20  Evaluation Hemodynamic Status: BP stable throughout; O2 sats: stable throughout Patient's Current Condition: stable Complications: No apparent complications Patient did tolerate procedure well. Chest X-ray ordered to verify placement.  CXR: pending.   Nicholas Caldwell 08/23/2019

## 2019-08-23 NOTE — Progress Notes (Signed)
RT NOTE:  Pt transported to CT and back to ICU without event.

## 2019-08-23 NOTE — Progress Notes (Signed)
This RN came to assess pt shift change. Pt is in agonal breathing, obtunded, not fTollowing commands. Baltazar Najjar ( NP on call) and rapid response Mindy paged. VS taken. ABG ordered. Pt placed to a Zoll monitor and crash cart ready. Narcan IV given x 2 with no response. Critical MDs came. Amidate IV 20-24 20 mg given and pt was intubated.    08/23/19 2000  Vitals  BP 131/75  MAP (mmHg) 92  Pulse Rate (!) 113  ECG Heart Rate (!) 114  Resp (!) 35  Oxygen Therapy  SpO2 98 %  O2 Device Nasal Cannula  O2 Flow Rate (L/min) 4 L/min  MEWS Score  MEWS RR 2  MEWS Pulse 2  MEWS Systolic 0  MEWS LOC 1  MEWS Temp 0  MEWS Score 5  MEWS Score Color Red

## 2019-08-24 ENCOUNTER — Encounter (HOSPITAL_COMMUNITY): Payer: Self-pay

## 2019-08-24 ENCOUNTER — Telehealth (HOSPITAL_COMMUNITY): Payer: Self-pay

## 2019-08-24 DIAGNOSIS — J9602 Acute respiratory failure with hypercapnia: Secondary | ICD-10-CM | POA: Diagnosis not present

## 2019-08-24 DIAGNOSIS — I469 Cardiac arrest, cause unspecified: Secondary | ICD-10-CM | POA: Diagnosis not present

## 2019-08-24 DIAGNOSIS — I5033 Acute on chronic diastolic (congestive) heart failure: Secondary | ICD-10-CM | POA: Diagnosis not present

## 2019-08-24 LAB — POCT I-STAT 7, (LYTES, BLD GAS, ICA,H+H)
Acid-Base Excess: 17 mmol/L — ABNORMAL HIGH (ref 0.0–2.0)
Bicarbonate: 42.4 mmol/L — ABNORMAL HIGH (ref 20.0–28.0)
Calcium, Ion: 1.15 mmol/L (ref 1.15–1.40)
HCT: 30 % — ABNORMAL LOW (ref 39.0–52.0)
Hemoglobin: 10.2 g/dL — ABNORMAL LOW (ref 13.0–17.0)
O2 Saturation: 93 %
Potassium: 3.2 mmol/L — ABNORMAL LOW (ref 3.5–5.1)
Sodium: 145 mmol/L (ref 135–145)
TCO2: 44 mmol/L — ABNORMAL HIGH (ref 22–32)
pCO2 arterial: 56.5 mmHg — ABNORMAL HIGH (ref 32.0–48.0)
pH, Arterial: 7.483 — ABNORMAL HIGH (ref 7.350–7.450)
pO2, Arterial: 64 mmHg — ABNORMAL LOW (ref 83.0–108.0)

## 2019-08-24 LAB — BASIC METABOLIC PANEL
Anion gap: 14 (ref 5–15)
Anion gap: 16 — ABNORMAL HIGH (ref 5–15)
BUN: 65 mg/dL — ABNORMAL HIGH (ref 8–23)
BUN: 64 mg/dL — ABNORMAL HIGH (ref 8–23)
CO2: 37 mmol/L — ABNORMAL HIGH (ref 22–32)
CO2: 36 mmol/L — ABNORMAL HIGH (ref 22–32)
Calcium: 9.2 mg/dL (ref 8.9–10.3)
Calcium: 9 mg/dL (ref 8.9–10.3)
Chloride: 95 mmol/L — ABNORMAL LOW (ref 98–111)
Chloride: 97 mmol/L — ABNORMAL LOW (ref 98–111)
Creatinine, Ser: 2.37 mg/dL — ABNORMAL HIGH (ref 0.61–1.24)
Creatinine, Ser: 2.44 mg/dL — ABNORMAL HIGH (ref 0.61–1.24)
GFR calc Af Amer: 31 mL/min — ABNORMAL LOW (ref >60)
GFR calc Af Amer: 30 mL/min — ABNORMAL LOW (ref >60)
GFR calc non Af Amer: 27 mL/min — ABNORMAL LOW (ref >60)
GFR calc non Af Amer: 26 mL/min — ABNORMAL LOW (ref >60)
Glucose, Bld: 111 mg/dL — ABNORMAL HIGH (ref 70–99)
Glucose, Bld: 169 mg/dL — ABNORMAL HIGH (ref 70–99)
Potassium: 2.8 mmol/L — ABNORMAL LOW (ref 3.5–5.1)
Potassium: 3.4 mmol/L — ABNORMAL LOW (ref 3.5–5.1)
Sodium: 146 mmol/L — ABNORMAL HIGH (ref 135–145)
Sodium: 149 mmol/L — ABNORMAL HIGH (ref 135–145)

## 2019-08-24 LAB — GLUCOSE, CAPILLARY
Glucose-Capillary: 132 mg/dL — ABNORMAL HIGH (ref 70–99)
Glucose-Capillary: 118 mg/dL — ABNORMAL HIGH (ref 70–99)
Glucose-Capillary: 106 mg/dL — ABNORMAL HIGH (ref 70–99)
Glucose-Capillary: 142 mg/dL — ABNORMAL HIGH (ref 70–99)
Glucose-Capillary: 167 mg/dL — ABNORMAL HIGH (ref 70–99)
Glucose-Capillary: 184 mg/dL — ABNORMAL HIGH (ref 70–99)
Glucose-Capillary: 223 mg/dL — ABNORMAL HIGH (ref 70–99)

## 2019-08-24 LAB — COOXEMETRY PANEL
Carboxyhemoglobin: 1.5 % (ref 0.5–1.5)
Methemoglobin: 0.6 % (ref 0.0–1.5)
O2 Saturation: 68 %
Total hemoglobin: 8.9 g/dL — ABNORMAL LOW (ref 12.0–16.0)

## 2019-08-24 LAB — CBC
HCT: 30.9 % — ABNORMAL LOW (ref 39.0–52.0)
Hemoglobin: 8.8 g/dL — ABNORMAL LOW (ref 13.0–17.0)
MCH: 26 pg (ref 26.0–34.0)
MCHC: 28.5 g/dL — ABNORMAL LOW (ref 30.0–36.0)
MCV: 91.2 fL (ref 80.0–100.0)
Platelets: 322 10*3/uL (ref 150–400)
RBC: 3.39 MIL/uL — ABNORMAL LOW (ref 4.22–5.81)
RDW: 20.7 % — ABNORMAL HIGH (ref 11.5–15.5)
WBC: 6.8 10*3/uL (ref 4.0–10.5)
nRBC: 2.5 % — ABNORMAL HIGH (ref 0.0–0.2)

## 2019-08-24 LAB — PHOSPHORUS: Phosphorus: 4 mg/dL (ref 2.5–4.6)

## 2019-08-24 LAB — CALCIUM, IONIZED: Calcium, Ionized, Serum: 4.7 mg/dL (ref 4.5–5.6)

## 2019-08-24 LAB — MAGNESIUM: Magnesium: 2.4 mg/dL (ref 1.7–2.4)

## 2019-08-24 MED ORDER — ACETAZOLAMIDE 250 MG PO TABS
250.0000 mg | ORAL_TABLET | Freq: Two times a day (BID) | ORAL | Status: DC
  Administered 2019-08-24 – 2019-08-25 (×4): 250 mg
  Filled 2019-08-24 (×5): qty 1

## 2019-08-24 MED ORDER — AMIODARONE HCL IN DEXTROSE 360-4.14 MG/200ML-% IV SOLN
60.0000 mg/h | INTRAVENOUS | Status: AC
  Administered 2019-08-24 (×2): 60 mg/h via INTRAVENOUS
  Filled 2019-08-24 (×2): qty 200

## 2019-08-24 MED ORDER — ACETAMINOPHEN 325 MG PO TABS: 650.0000 mg | ORAL_TABLET | Freq: Four times a day (QID) | ORAL | Status: DC | PRN

## 2019-08-24 MED ORDER — PREDNISONE 20 MG PO TABS
40.0000 mg | ORAL_TABLET | Freq: Every day | ORAL | Status: DC
  Administered 2019-08-24 – 2019-08-26 (×3): 40 mg
  Filled 2019-08-24 (×2): qty 2

## 2019-08-24 MED ORDER — POTASSIUM CHLORIDE 20 MEQ PO PACK
40.0000 meq | PACK | Freq: Once | ORAL | Status: AC
  Administered 2019-08-24: 40 meq

## 2019-08-24 MED ORDER — POTASSIUM CHLORIDE 20 MEQ PO PACK
40.0000 meq | PACK | Freq: Once | ORAL | Status: DC
  Filled 2019-08-24: qty 2

## 2019-08-24 MED ORDER — POTASSIUM CHLORIDE 20 MEQ/15ML (10%) PO SOLN
20.0000 meq | ORAL | Status: AC
  Administered 2019-08-24 – 2019-08-25 (×2): 20 meq
  Filled 2019-08-24: qty 15

## 2019-08-24 MED ORDER — POTASSIUM CHLORIDE 10 MEQ/50ML IV SOLN
10.0000 meq | INTRAVENOUS | Status: AC
  Administered 2019-08-24 (×4): 10 meq via INTRAVENOUS
  Filled 2019-08-24 (×4): qty 50

## 2019-08-24 MED ORDER — POTASSIUM CHLORIDE 10 MEQ/50ML IV SOLN: 10.0000 meq | INTRAVENOUS | Status: DC

## 2019-08-24 MED ORDER — PREDNISONE 20 MG PO TABS: 40.0000 mg | ORAL_TABLET | Freq: Every day | ORAL | Status: DC

## 2019-08-24 MED ORDER — CHLORHEXIDINE GLUCONATE 0.12% ORAL RINSE (MEDLINE KIT)
15.0000 mL | Freq: Two times a day (BID) | OROMUCOSAL | Status: DC
  Administered 2019-08-24 – 2019-08-29 (×9): 15 mL via OROMUCOSAL

## 2019-08-24 MED ORDER — VITAL HIGH PROTEIN PO LIQD
1000.0000 mL | ORAL | Status: DC
  Administered 2019-08-24: 19:00:00 1000 mL

## 2019-08-24 MED ORDER — PRO-STAT SUGAR FREE PO LIQD
60.0000 mL | Freq: Two times a day (BID) | ORAL | Status: DC
  Administered 2019-08-24 – 2019-08-25 (×3): 60 mL
  Filled 2019-08-24 (×3): qty 60

## 2019-08-24 MED ORDER — AMIODARONE LOAD VIA INFUSION
150.0000 mg | Freq: Once | INTRAVENOUS | Status: AC
  Administered 2019-08-24: 150 mg via INTRAVENOUS
  Filled 2019-08-24: qty 83.34

## 2019-08-24 MED ORDER — ORAL CARE MOUTH RINSE
15.0000 mL | OROMUCOSAL | Status: DC
  Administered 2019-08-24 – 2019-08-26 (×22): 15 mL via OROMUCOSAL

## 2019-08-24 MED ORDER — ACETAMINOPHEN 160 MG/5ML PO SOLN
650.0000 mg | Freq: Four times a day (QID) | ORAL | Status: DC | PRN
  Filled 2019-08-24: qty 20.3

## 2019-08-24 MED ORDER — AMIODARONE HCL IN DEXTROSE 360-4.14 MG/200ML-% IV SOLN
30.0000 mg/h | INTRAVENOUS | Status: DC
  Administered 2019-08-24 – 2019-08-25 (×2): 30 mg/h via INTRAVENOUS
  Filled 2019-08-24 (×2): qty 200

## 2019-08-24 NOTE — Progress Notes (Addendum)
Patient ID: Nicholas Caldwell, male   DOB: [birth year], 73 y.o.   MRN: [id number]     Advanced Heart Failure Rounding Note  PCP-Cardiologist: No primary care provider on file.   Subjective:    Initially in CCU with PEA arrest likely due to pain meds and noncompliance with Bipap.  Extubated 10/19.   10/21 was transferred to floor, found to be obtunded (had had oxycodone) and unable to protect airway with hypercarbic respiratory failure.  He was intubated again.    This morning, he is sedated back on vent.  In atrial fibrillation with rate 90s-120s.  BP stable. CVP 15 on my measure with co-ox 68%.    Objective:   Weight Range: 124.2 kg Body mass index is 38.19 kg/m.   Vital Signs:   Temp:  [97.9 F (36.6 C)-100.1 F (37.8 C)] 100.1 F (37.8 C) (10/21 0750) Pulse Rate:  [54-120] 85 (10/21 0806) Resp:  [13-35] 20 (10/21 0806) BP: (85-134)/(53-90) 128/62 (10/21 0630) SpO2:  [92 %-100 %] 97 % (10/21 0806) FiO2 (%):  [40 %-50 %] 40 % (10/21 0806) Last BM Date: 08/24/19  Weight change: Filed Weights   08/21/19 0032 08/21/19 0210  Weight: 122.5 kg 124.2 kg    Intake/Output:   Intake/Output Summary (Last 24 hours) at 08/24/2019 0832 Last data filed at 08/24/2019 0600 Gross per 24 hour  Intake 170.2 ml  Output 1570 ml  Net -1399.8 ml      Physical Exam    General: Intubated/sedated.  Neck: JVP 12+ cm, no thyromegaly or thyroid nodule.  Lungs: Distant BS bilaterally.  CV: Nondisplaced PMI.  Heart mildly tachy, irregular S1/S2, no S3/S4, no murmur.  1+ ankle edema.  Abdomen: Soft, nontender, no hepatosplenomegaly, no distention.  Skin: Intact without lesions or rashes.  Neurologic: Sedated on vent  Extremities: No clubbing or cyanosis.  HEENT: Normal.    Telemetry   Atrial fibrillation rate 90s-120s.  Personally reviewed.   EKG    Atrial fibrillation (personally reviewed)  Labs    CBC Recent Labs    08/23/19 0454 08/23/19 2121 08/24/19 0449  WBC 7.2  --  6.8   HGB 9.0* 11.2* 8.8*  HCT 30.5* 33.0* 30.9*  MCV 90.2  --  91.2  PLT 349  --  718   Basic Metabolic Panel Recent Labs    08/23/19 0454 08/23/19 2121 08/24/19 0449  NA 145 144 146*  K 3.3* 3.9 2.8*  CL 93*  --  95*  CO2 39*  --  37*  GLUCOSE 106*  --  111*  BUN 70*  --  65*  CREATININE 2.60*  --  2.37*  CALCIUM 8.8*  --  9.2  MG 2.3  --  2.4  PHOS 4.5  --   --    Liver Function Tests No results for input(s): AST, ALT, ALKPHOS, BILITOT, PROT, ALBUMIN in the last 72 hours. No results for input(s): LIPASE, AMYLASE in the last 72 hours. Cardiac Enzymes No results for input(s): CKTOTAL, CKMB, CKMBINDEX, TROPONINI in the last 72 hours.  BNP: BNP (last 3 results) Recent Labs    08/20/19 2210  BNP 4,121.7*    ProBNP (last 3 results) No results for input(s): PROBNP in the last 8760 hours.   D-Dimer No results for input(s): DDIMER in the last 72 hours. Hemoglobin A1C No results for input(s): HGBA1C in the last 72 hours. Fasting Lipid Panel No results for input(s): CHOL, HDL, LDLCALC, TRIG, CHOLHDL, LDLDIRECT in the last 72 hours. Thyroid Function Tests No  results for input(s): TSH, T4TOTAL, T3FREE, THYROIDAB in the last 72 hours.  Invalid input(s): FREET3  Other results:   Imaging    Ct Head Wo Contrast  Result Date: 08/23/2019 CLINICAL DATA:  Altered LOC EXAM: CT HEAD WITHOUT CONTRAST TECHNIQUE: Contiguous axial images were obtained from the base of the skull through the vertex without intravenous contrast. COMPARISON:  CT brain 08/21/2019 FINDINGS: Brain: No acute territorial infarction, hemorrhage or intracranial mass. Chronic infarcts in the right-greater-than-left cerebellum. Mild small vessel ischemic changes of the white matter. Stable ventricle size. Vascular: No hyperdense vessels.  Carotid vascular calcification Skull: Normal. Negative for fracture or focal lesion. Sinuses/Orbits: No acute finding. Other: None IMPRESSION: 1. No CT evidence for acute  intracranial abnormality. 2. Mild small vessel ischemic changes of the white matter. Chronic cerebellar infarcts Electronically Signed   By: Donavan Foil M.D.   On: 08/23/2019 23:25   Dg Chest Port 1 View  Result Date: 08/23/2019 CLINICAL DATA:  Post intubation EXAM: PORTABLE CHEST 1 VIEW COMPARISON:  August 22, 2019 FINDINGS: ETT is 2 cm above the carina. NG tube is below the diaphragm. A right-sided central venous catheter seen with the tip at the superior cavoatrial junction. Slight interval worsening in the hazy airspace opacity in both lungs. There is blunting of the left costophrenic angle, likely small effusion. The cardiomediastinal silhouette is unchanged. IMPRESSION: ET tube 2 cm above the carina. NG tube below the diaphragm Slight interval worsening in the hazy airspace opacity in both lung, which could be due to worsening edema and/or developing ARDS. Probable small left pleural effusion Electronically Signed   By: Prudencio Pair M.D.   On: 08/23/2019 21:34     Medications:     Scheduled Medications:  acetaZOLAMIDE  250 mg Oral BID   apixaban  5 mg Per Tube BID   arformoterol  15 mcg Nebulization BID   budesonide (PULMICORT) nebulizer solution  0.5 mg Nebulization BID   chlorhexidine gluconate (MEDLINE KIT)  15 mL Mouth Rinse BID   Chlorhexidine Gluconate Cloth  6 each Topical Daily   insulin aspart  0-20 Units Subcutaneous Q4H   insulin glargine  10 Units Subcutaneous QHS   ipratropium-albuterol  3 mL Nebulization BID   mouth rinse  15 mL Mouth Rinse 10 times per day   predniSONE  40 mg Oral Q breakfast   sodium chloride flush  10-40 mL Intracatheter Q12H    Infusions:  sodium chloride Stopped (08/22/19 2109)   famotidine (PEPCID) IV 20 mg (08/23/19 2330)   furosemide 62 mL/hr at 08/23/19 0900   piperacillin-tazobactam (ZOSYN)  IV 3.375 g (08/24/19 0056)   potassium chloride 10 mEq (08/24/19 0639)    PRN Medications: sodium chloride, acetaminophen,  fentaNYL (SUBLIMAZE) injection, fentaNYL (SUBLIMAZE) injection, metoprolol tartrate, midazolam, midazolam, naLOXone (NARCAN)  injection, sodium chloride flush    Patient Profile   Nicholas Caldwell a 73 y.o.malewith a history of chronic systolic CHFdue to NICM (EF 40-45%)with moderate RV failure, COPD on 3 L O2, DM, HTN, OHS/OSA on CPAP, PAF s/p DCCV, hyperlipidemia, CAD, noncompliance, multiple admissions over the last 6 months due to HF and respiratory failure, readmitted after out of hospital PEA arrest. AHF consulted for management of acute on chronic biventricular failure (predominant RV failure), at the request of Dr. Elsworth Soho, Critical Care Medicine.   Assessment/Plan    1. Acute on chronic hypoxemic/hypercarbic respiratory failure: Baseline COPD and OHS/OSA.  He was not compliant with Bipap at home and was using opiates.  He had  a PEA arrest and was intubated.  Got oxycodone on 10/20 while on floor, became obtunded with acute on chronic hypoxemic/hypercarbic respiratory failure and was intubated again.  - Continue diuresis.  - Limit opioids.  - Wean vent per CCM.  1. Chronic systolic => diastolic CHF: With prominent RV failure, likely related to COPD/OSA/OHS. TEE in 3/20 with EF 40-45%, severely dilated RV with moderately decreased systolic function. Echo this admission showed EF up to 60-65% but with severely dilated and dysfunctional RV. He has had multiple recent admissions with CHF and COPD exacerbations. He is chronically NYHA class IIIb. He is on high doses of diuretics at home but has remained volume overloaded as outpatient.  Now significantly volume overloaded with intractable RV failure as inpatient. CVP 15 today, co-ox 68%.  - Continue Lasix 120 mg IV bid, will give Diamox 250 mg bid with elevated HCO3.  - Will start amiodarone for rate/rhythm control.  -Bidil on hold with soft BP.  2.Atrial fibrillation: Paroxysmal. He has history of CVA. He had DCCV in 3/20 and again in  5/20.  He failed amiodarone in the past and cannot take Tikosyn due to baseline prolonged QT interval.  He is back in atrial fibrillation this morning with RVR.  - Continue apixaban 5 mg bid.   - Poor ablation candidate with LA size.  - I will try him again on amiodarone for rate/rhythm control, start IV.  He has been on apixaban.  3. COPD:No longer smokes. Continue homeoxygen. CT chest showed emphysema. 4.OHS/OSA: Continue 3 L oxygen during the day and CPAP at night at baseline.   - Needs to avoid narcotics.   5.CAD: Nonobstructive disease in 2/20. No chest pain. - Continue crestor 20 mg daily. - He is on apixaban so no ASA.   6. AKI on CKD Stage 3: Creatinine lower today at 2.37 7. Pleural effusion: S/p recent thoracentesis for right pleural effusion. Effusions remain present.  8. Hypokalemia: Aggressively replace K with very low K this morning.   - BMET this afternoon.   CRITICAL CARE Performed by: Loralie Champagne  Total critical care time: 40 minutes  Critical care time was exclusive of separately billable procedures and treating other patients.  Critical care was necessary to treat or prevent imminent or life-threatening deterioration.  Critical care was time spent personally by me on the following activities: development of treatment plan with patient and/or surrogate as well as nursing, discussions with consultants, evaluation of patient's response to treatment, examination of patient, obtaining history from patient or surrogate, ordering and performing treatments and interventions, ordering and review of laboratory studies, ordering and review of radiographic studies, pulse oximetry and re-evaluation of patient's condition.  Loralie Champagne 08/24/2019 8:32 AM

## 2019-08-24 NOTE — Progress Notes (Signed)
The care of this patient has been intubated overnight.  Care has been transferred back from the hospitalist service to [facility].  Dr. Chase Caller and I have discussed patient and he will assume care.  Please contact Team 1 when patient stable for transfer out of unit.

## 2019-08-24 NOTE — Progress Notes (Signed)
[place] Progress Note Patient Name: Nicholas Caldwell DOB: 09-22-1946 MRN: [id number]   Date of Service  08/24/2019  HPI/Events of Note  Pt with K+ 2.8, he had one episode of a wide-complex rhythm.  eICU Interventions  Elink K+ replacement protocol modified for renal dysfunction ordered.     Intervention Category Major Interventions: Electrolyte abnormality - evaluation and management  Frederik Pear 08/24/2019, 6:21 AM

## 2019-08-24 NOTE — Progress Notes (Signed)
Initial Nutrition Assessment  RD working remotely.  DOCUMENTATION CODES:   Obesity unspecified  INTERVENTION:   Initiate tube feeding: - Vital High Protein @ 50 ml/hr (1200 ml/day) via OG tube - Pro-stat 60 ml BID  Tube feeding regimen provides 1600 kcal, 165 grams of protein, and 1003 ml of H2O.   NUTRITION DIAGNOSIS:   Inadequate oral intake related to inability to eat as evidenced by NPO status.  GOAL:   Provide needs based on ASPEN/SCCM guidelines  MONITOR:   Vent status, Labs, Weight trends, TF tolerance, Skin, I & O's  REASON FOR ASSESSMENT:   Ventilator, Consult Enteral/tube feeding initiation and management  ASSESSMENT:   73 year old male who presented to the ED on 10/17 after a witnessed PEA arrest. PMH of CAD, CHF, atrial fibrillation, HTN, COPD, T2DM, recent pneumonia, sleep apnea, CKD 3-4. Pt required intubation. TTM 36 degrees initiated.   10/18 - extubated 10/20 - obtunded, emergently intubated  RD consulted for TF intiation and management.  Per CCM note, pt poorly responsive today.  OGT in place, currently to LIS.  Per RN edema assessment, pt with moderate pitting edema to BLE.  Patient is currently intubated on ventilator support MV: 8.6 L/min Temp (24hrs), Avg:99 F (37.2 C), Min:97.9 F (36.6 C), Max:100.1 F (37.8 C) BP (cuff): 94/59 MAP (cuff): 70  Drips: Amiodarone: 33.3 ml/hr NS: 10 ml/hr  Medications reviewed and include: SSI q 4 hours, Lantus 10 units daily, Prednisone, IV Pepcid, IV Lasix 120 mg q 12 hours, IV abx  Labs reviewed: potassium 3.2 CBG's: 106-202 x 24 hours  UOP: 1345 ml x 24 hours OGT: 350 ml x 24 hours I/O's: -5.4 L since admit  NUTRITION - FOCUSED PHYSICAL EXAM:  Unable to complete at this time. RD working remotely.  Diet Order:   Diet Order            Diet NPO time specified  Diet effective now              EDUCATION NEEDS:   No education needs have been identified at this time  Skin:   Skin Assessment: Skin Integrity Issues: Skin Integrity Issues: Stage II: sacrum, right buttocks  Last BM:  08/24/19  Height:   Ht Readings from Last 1 Encounters:  08/21/19 _0  (1.803 m)    Weight:   Wt Readings from Last 1 Encounters:  08/21/19 124.2 kg    Ideal Body Weight:  78.2 kg  BMI:  Body mass index is 38.19 kg/m.  Estimated Nutritional Needs:   Kcal:  5997-7414  Protein:  156-176 grams  Fluid:  >/= 1.5 L    Gaynell Face, MS, RD, LDN Inpatient Clinical Dietitian Pager: [phone] Weekend/After Hours: [phone]

## 2019-08-24 NOTE — Progress Notes (Signed)
NAME:  Nicholas Caldwell, MRN:  [id number], DOB:  May 01, 1946, LOS: 4 ADMISSION DATE:  08/20/2019, CONSULTATION DATE:  08/21/19 REFERRING MD:  Nicholas Caldwell , CHIEF COMPLAINT:  NA  Brief History   73 y/o M admitted on 10/17 after suffering a PEA arrest (required 12 min CPR before ROSC).  Recent admit from 10/6-10/12 for RLL PNA, CHF decompensation and COPD exacerbation. CBG's noted to be >400, weight up 4lbs.    Patient was transferred out of ICU 10/20 to floor. At some point early afternoon he received oxycodone and was placed on 5L [place]. O2 sats consistently documented at 98-99%. He became encephalopathic and gradually obtunded. PCCM called for re-consult.  Past Medical History  HTN, PAF - on eliquis, ACD, HfrEF, COPD, OSA  Significant Hospital Events   10/17 Admit post PEA arrest, intubated 10/18 Cooling protocol stopped as pt awake / alert,extubated 10/20 tx to floor, then became obtunded gradually. Intubated and tx back to ICU  Consults:  Cardiology   Procedures:  ETT 10/17 >> 10/18 R IJ TLC 10/17 >>  Significant Diagnostic Tests:  CT Head 10/18 >> negative for acute abnormality  ECHO 10/18> LVEF 98-92% Severe RV systolic dysfunction.   Micro Data:  COVID 10/18 >> negative  BCx2 10/18 >>  GPC 1/4 bottles   Antimicrobials:  Vanco 10/17 >> 10/18  Zosyn 10/18 >>   Interim history/subjective:   08/24/2019 - intubated after an opioid. Still poorly responsive. Occ has moved all 4s but more upper. On lasix gtt and amio gtt. Not on sedation gtt    Objective   Blood pressure 128/62, pulse 85, temperature 100.1 F (37.8 C), temperature source Oral, resp. rate 20, height _0  (1.803 m), weight 124.2 kg, SpO2 97 %. CVP:  [10 mmHg-18 mmHg] 10 mmHg  Vent Mode: CPAP;PSV FiO2 (%):  [40 %-50 %] 40 % Set Rate:  [24 bmp-28 bmp] 24 bmp Vt Set:  [580 mL] 580 mL PEEP:  [5 cmH20] 5 cmH20 Pressure Support:  [12 cmH20] 12 cmH20 Plateau Pressure:  [28 cmH20-29 cmH20] 29 cmH20   Intake/Output  Summary (Last 24 hours) at 08/24/2019 1023 Last data filed at 08/24/2019 0600 Gross per 24 hour  Intake 100 ml  Output 1095 ml  Net -995 ml   Filed Weights   08/21/19 0032 08/21/19 0210  Weight: 122.5 kg 124.2 kg    General Appearance:  Looks criticall ill Head:  Normocephalic, without obvious abnormality, atraumatic Eyes:  PERRL - yes, conjunctiva/corneas - muddy     Ears:  Normal external ear canals, both ears Nose:  G tube - no Throat:  ETT TUBE - yes , OG tube - yes Neck:  Supple,  No enlargement/tenderness/nodules Lungs: Clear to auscultation bilaterally, Ventilator   Synchrony - yes Heart:  S1 and S2 normal, no murmur, CVP - no.  Pressors - no Abdomen:  Soft, no masses, no organomegaly Genitalia / Rectal:  Not done Extremities:  Extremities- intact Skin:  ntact in exposed areas . Sacral area - not examined Neurologic:  Sedation - none -> RASS --4 . Moves all 4s - UL > LL. CAM-ICU - obtunded . Orientation - not oriented      Resolved Hospital Problem list     Assessment & Plan:   Acute hypercarbic respiratory failure: suspected in the setting of opioid administration and decompensated OSA. REtinbuated for same 08/23/2019   08/24/2019 - > does not meet criteria for SBT/Extubation in setting of Acute Respiratory Failure due to - obtundation and acute encephalopathy  Plan-  - PRVC - HOP > 30 degrees   PEA Arrest CPR x 12 minute Suspect primary pulmonary as patient was not wearing bipap following recent discharge  EKG unchanged from prior admit, troponin with mild leak / likely demand. CT head negative.   08/24/2019  - normal BP/HR . On amio gtt. On lasix gtt  P -rewarmed -Supportive care -Cardiology following  Acute on chronic systolic and diastolic heart failure LVEF 51-88% Sever RV systolic dysfunction   41/66/0630  - Dr Nicholas Caldwell folliwing  P -Heart Failure following -Diuresis per cards -Strict I&O  AKI on CKD3 Baseline sr cr ~ 1.7  Recent  Labs  Lab 08/21/19 0017 08/21/19 0158 08/22/19 0231 08/23/19 0454 08/24/19 0449  CREATININE 2.75* 2.53* 2.60* 2.60* 2.37*   Creat improving  P -Trend BMP  AECOPD -continue solumedrol 35m qD  -continue pulmicort + brovana  -TID duoneb  -holding home symbicort   OSA - noncompliant with NIMV at home.  - qHS BiPAP and PRN during waking hours/nap once able to be extubated - take care to not hyper-oxygenate  PAF- On Eliquis at home.  Episode of bradycardia in ER, suspect respiratory driven / hypercarbia Bradycardia Hx HTN, HLD - continue eliquis - PRN metoprolol for SBP >170 - Continue to hold home bisoprolol for now  DM 2: Hgb A1c 7.4 on 10/6.   - lantus 10 units daily - SSI, resistant scale   Acute encephalopathy  . - 08/24/2019 obtunded intermittently without sedation. CT hea 10/20 - negative  Plan Monitor Might need neuro consult   Best practice:  Diet:  NPO Pain/Anxiety/Delirium protocol (if indicated): PRN, RASS goal 0 to -1 VAP protocol (if indicated): yes DVT prophylaxis: Eliquis GI prophylaxis: Pepcid BID Glucose control: SSI, lantus Mobility: NA Code Status: Full code  Family Communication: none at bedside  Disposition: ICU    ATTESTATION & SIGNATURE   The patient Nicholas Caldwell critically ill with multiple organ systems failure and requires high complexity decision making for assessment and support, frequent evaluation and titration of therapies, application of advanced monitoring technologies and extensive interpretation of multiple databases.   Critical Care Time devoted to patient care services described in this note is  30  Minutes. This time reflects time of care of this signee Dr Nicholas Caldwell This critical care time does not reflect procedure time, or teaching time or supervisory time of PA/NP/Med student/Med Resident etc but could involve care discussion time     Dr. MBrand Caldwell M.D., FCozad Community HospitalC.P Pulmonary and Critical Care  Medicine Staff Physician CReifftonPulmonary and Critical Care Pager: [phone] If no answer or between  15:00h - 7:00h: call [phone]  08/24/2019 10:23 AM    LABS    PULMONARY Recent Labs  Lab 08/20/19 2253 08/22/19 1935 08/23/19 0815 08/23/19 2000 08/23/19 2121 08/24/19 0455  PHART 7.339*  --   --  7.123* 7.380  --   PCO2ART 66.6*  --   --  CRITICAL RESULT CALLED TO, READ BACK BY AND VERIFIED WITH: 74.8*  --   PO2ART 98.0  --   --  74.5* 110.0*  --   HCO3 36.2*  --   --  45.5* 44.4*  --   TCO2 38*  --   --   --  47*  --   O2SAT 97.0 92.8 83.3 90.4 98.0 68.0    CBC Recent Labs  Lab 08/22/19 0231 08/23/19 0454 08/23/19 2121 08/24/19 0449  HGB 8.6*  9.0* 11.2* 8.8*  HCT 28.3* 30.5* 33.0* 30.9*  WBC 7.3 7.2  --  6.8  PLT 338 349  --  322    COAGULATION Recent Labs  Lab 08/21/19 0017 08/21/19 0602 08/21/19 1201  INR 1.4* 1.5* 1.6*    CARDIAC  No results for input(s): TROPONINI in the last 168 hours. No results for input(s): PROBNP in the last 168 hours.   CHEMISTRY Recent Labs  Lab 08/20/19 2210  08/21/19 0017 08/21/19 0158 08/22/19 0231 08/22/19 0934 08/23/19 0454 08/23/19 2121 08/24/19 0449  NA 136   < > 135 136 141  --  145 144 146*  K 5.9*   < > 6.0* 4.5 4.0  --  3.3* 3.9 2.8*  CL 90*  --  90* 89* 93*  --  93*  --  95*  CO2 36*  --  31 32 34*  --  39*  --  37*  GLUCOSE 231*  --  194* 198* 129*  --  106*  --  111*  BUN 68*  --  69* 67* 72*  --  70*  --  65*  CREATININE 2.90*  --  2.75* 2.53* 2.60*  --  2.60*  --  2.37*  CALCIUM 8.1*  --  8.3* 8.4* 8.5*  --  8.8*  --  9.2  MG 2.5*  --   --  2.4  --  2.4 2.3  --  2.4  PHOS  --   --   --  5.2*  --   --  4.5  --   --    < > = values in this interval not displayed.   Estimated Creatinine Clearance: 38.4 mL/min (A) (by C-G formula based on SCr of 2.37 mg/dL (H)).   LIVER Recent Labs  Lab 08/20/19 2210 08/21/19 0017 08/21/19 0602 08/21/19 1201  AST 26  --    --   --   ALT 31  --   --   --   ALKPHOS 172*  --   --   --   BILITOT 0.6  --   --   --   PROT 6.3*  --   --   --   ALBUMIN 3.1*  --   --   --   INR  --  1.4* 1.5* 1.6*     INFECTIOUS Recent Labs  Lab 08/21/19 0855 08/22/19 0231 08/23/19 0454  PROCALCITON 0.74 0.73 0.41     ENDOCRINE CBG (last 3)  Recent Labs    08/23/19 1958 08/24/19 0344 08/24/19 0751  GLUCAP 202* 118* 106*         IMAGING x48h  - image(s) personally visualized  -   highlighted in bold Ct Head Wo Contrast  Result Date: 08/23/2019 CLINICAL DATA:  Altered LOC EXAM: CT HEAD WITHOUT CONTRAST TECHNIQUE: Contiguous axial images were obtained from the base of the skull through the vertex without intravenous contrast. COMPARISON:  CT brain 08/21/2019 FINDINGS: Brain: No acute territorial infarction, hemorrhage or intracranial mass. Chronic infarcts in the right-greater-than-left cerebellum. Mild small vessel ischemic changes of the white matter. Stable ventricle size. Vascular: No hyperdense vessels.  Carotid vascular calcification Skull: Normal. Negative for fracture or focal lesion. Sinuses/Orbits: No acute finding. Other: None IMPRESSION: 1. No CT evidence for acute intracranial abnormality. 2. Mild small vessel ischemic changes of the white matter. Chronic cerebellar infarcts Electronically Signed   By: Donavan Foil M.D.   On: 08/23/2019 23:25   Dg Chest Port 1 View  Result Date:  08/23/2019 CLINICAL DATA:  Post intubation EXAM: PORTABLE CHEST 1 VIEW COMPARISON:  August 22, 2019 FINDINGS: ETT is 2 cm above the carina. NG tube is below the diaphragm. A right-sided central venous catheter seen with the tip at the superior cavoatrial junction. Slight interval worsening in the hazy airspace opacity in both lungs. There is blunting of the left costophrenic angle, likely small effusion. The cardiomediastinal silhouette is unchanged. IMPRESSION: ET tube 2 cm above the carina. NG tube below the diaphragm Slight  interval worsening in the hazy airspace opacity in both lung, which could be due to worsening edema and/or developing ARDS. Probable small left pleural effusion Electronically Signed   By: Prudencio Pair M.D.   On: 08/23/2019 21:34

## 2019-08-24 NOTE — Telephone Encounter (Signed)
Left for patient, patient shoes left in CT radiology last week.  Shoes here in office.

## 2019-08-24 NOTE — Progress Notes (Signed)
Summary- Pt rec'd from floor via bed post rapid response. Pt intubated prior to arrival, not responsive  to verbal or painful stimuli. Unable to orient pt to room or unit. OGT inserted by [employer], RN, confirmed by [employer]. Brown drainage noted when LIS initiated. Per MD instruction, no sedation to be utilized- bilat soft wrist restraints applied to protect ETT. Stat head CT obtained per order. In service provided by [employer] rep for [employer] External urine management system for males, applied to pt.

## 2019-08-24 NOTE — Progress Notes (Signed)
PT Cancellation Note  Patient Details Name: Nicholas Caldwell MRN: [id number] DOB: 1946/09/30   Cancelled Treatment:    Reason Eval/Treat Not Completed: Patient not medically ready;Medical issues which prohibited therapy. Per discussion with Domingo Pulse., RN, pt is not sedated, however remains obtunded, unable to follow commands, and only intermittently opens eyes to attempts at arousal. PT will hold services until pt is more alert and better able to participate in functional mobility.  Zenaida Niece, PT, DPT Acute Rehabilitation Pager: [phone]    Zenaida Niece 08/24/2019, 11:05 AM

## 2019-08-24 NOTE — Progress Notes (Signed)
eLink Physician-Brief Progress Note Patient Name: Nicholas Caldwell DOB: [birth year] MRN: [id number]   Date of Service  08/24/2019  HPI/Events of Note  Pt emergently intubated on the floor secondary to narcotic induced acute hypercapnic respiratory failure, he was subsequently transferred to the ICU.  eICU Interventions  PCCM boots on the ground saw him in consultation, plan is extubation when he returns to his baseline, CT head if altered mental status persists.        Kerry Kass Ogan 08/24/2019, 5:55 AM

## 2019-08-25 ENCOUNTER — Inpatient Hospital Stay (HOSPITAL_COMMUNITY): Payer: Medicare Other

## 2019-08-25 DIAGNOSIS — J9602 Acute respiratory failure with hypercapnia: Secondary | ICD-10-CM | POA: Diagnosis not present

## 2019-08-25 DIAGNOSIS — I5031 Acute diastolic (congestive) heart failure: Secondary | ICD-10-CM | POA: Diagnosis not present

## 2019-08-25 DIAGNOSIS — I469 Cardiac arrest, cause unspecified: Secondary | ICD-10-CM | POA: Diagnosis not present

## 2019-08-25 LAB — CULTURE, BLOOD (ROUTINE X 2)
Culture: NO GROWTH
Special Requests: ADEQUATE

## 2019-08-25 LAB — CBC
HCT: 30.2 % — ABNORMAL LOW (ref 39.0–52.0)
Hemoglobin: 8.9 g/dL — ABNORMAL LOW (ref 13.0–17.0)
MCH: 25.9 pg — ABNORMAL LOW (ref 26.0–34.0)
MCHC: 29.5 g/dL — ABNORMAL LOW (ref 30.0–36.0)
MCV: 88 fL (ref 80.0–100.0)
Platelets: 334 10*3/uL (ref 150–400)
RBC: 3.43 MIL/uL — ABNORMAL LOW (ref 4.22–5.81)
RDW: 21.2 % — ABNORMAL HIGH (ref 11.5–15.5)
WBC: 6 10*3/uL (ref 4.0–10.5)
nRBC: 2 % — ABNORMAL HIGH (ref 0.0–0.2)

## 2019-08-25 LAB — BASIC METABOLIC PANEL
Anion gap: 14 (ref 5–15)
Anion gap: 15 (ref 5–15)
BUN: 75 mg/dL — ABNORMAL HIGH (ref 8–23)
BUN: 74 mg/dL — ABNORMAL HIGH (ref 8–23)
CO2: 36 mmol/L — ABNORMAL HIGH (ref 22–32)
CO2: 35 mmol/L — ABNORMAL HIGH (ref 22–32)
Calcium: 9.2 mg/dL (ref 8.9–10.3)
Calcium: 8.8 mg/dL — ABNORMAL LOW (ref 8.9–10.3)
Chloride: 99 mmol/L (ref 98–111)
Chloride: 102 mmol/L (ref 98–111)
Creatinine, Ser: 2.4 mg/dL — ABNORMAL HIGH (ref 0.61–1.24)
Creatinine, Ser: 2.18 mg/dL — ABNORMAL HIGH (ref 0.61–1.24)
GFR calc Af Amer: 30 mL/min — ABNORMAL LOW (ref >60)
GFR calc Af Amer: 34 mL/min — ABNORMAL LOW (ref >60)
GFR calc non Af Amer: 26 mL/min — ABNORMAL LOW (ref >60)
GFR calc non Af Amer: 29 mL/min — ABNORMAL LOW (ref >60)
Glucose, Bld: 156 mg/dL — ABNORMAL HIGH (ref 70–99)
Glucose, Bld: 173 mg/dL — ABNORMAL HIGH (ref 70–99)
Potassium: 2.6 mmol/L — CL (ref 3.5–5.1)
Potassium: 2.7 mmol/L — CL (ref 3.5–5.1)
Sodium: 149 mmol/L — ABNORMAL HIGH (ref 135–145)
Sodium: 152 mmol/L — ABNORMAL HIGH (ref 135–145)

## 2019-08-25 LAB — COOXEMETRY PANEL
Carboxyhemoglobin: 1.3 % (ref 0.5–1.5)
Methemoglobin: 0.6 % (ref 0.0–1.5)
O2 Saturation: 63.3 %
Total hemoglobin: 9.1 g/dL — ABNORMAL LOW (ref 12.0–16.0)

## 2019-08-25 LAB — MAGNESIUM: Magnesium: 2.4 mg/dL (ref 1.7–2.4)

## 2019-08-25 LAB — GLUCOSE, CAPILLARY
Glucose-Capillary: 125 mg/dL — ABNORMAL HIGH (ref 70–99)
Glucose-Capillary: 139 mg/dL — ABNORMAL HIGH (ref 70–99)
Glucose-Capillary: 157 mg/dL — ABNORMAL HIGH (ref 70–99)
Glucose-Capillary: 158 mg/dL — ABNORMAL HIGH (ref 70–99)
Glucose-Capillary: 158 mg/dL — ABNORMAL HIGH (ref 70–99)
Glucose-Capillary: 191 mg/dL — ABNORMAL HIGH (ref 70–99)

## 2019-08-25 LAB — PHOSPHORUS: Phosphorus: 2.5 mg/dL (ref 2.5–4.6)

## 2019-08-25 MED ORDER — AMIODARONE HCL 200 MG PO TABS
200.0000 mg | ORAL_TABLET | Freq: Two times a day (BID) | ORAL | Status: DC
  Administered 2019-08-25 – 2019-08-26 (×2): 200 mg
  Filled 2019-08-25 (×2): qty 1

## 2019-08-25 MED ORDER — POTASSIUM CHLORIDE 20 MEQ/15ML (10%) PO SOLN
40.0000 meq | ORAL | Status: AC
  Administered 2019-08-25 (×2): 40 meq
  Filled 2019-08-25 (×2): qty 30

## 2019-08-25 MED ORDER — AMIODARONE HCL 200 MG PO TABS
200.0000 mg | ORAL_TABLET | Freq: Two times a day (BID) | ORAL | Status: DC
  Administered 2019-08-25: 13:00:00 200 mg via ORAL
  Filled 2019-08-25 (×2): qty 1

## 2019-08-25 MED ORDER — POTASSIUM CHLORIDE 10 MEQ/50ML IV SOLN
10.0000 meq | INTRAVENOUS | Status: AC
  Administered 2019-08-25 (×4): 10 meq via INTRAVENOUS
  Filled 2019-08-25: qty 50

## 2019-08-25 MED ORDER — FUROSEMIDE 10 MG/ML IJ SOLN
80.0000 mg | Freq: Once | INTRAMUSCULAR | Status: AC
  Administered 2019-08-25: 80 mg via INTRAVENOUS
  Filled 2019-08-25: qty 8

## 2019-08-25 MED ORDER — POTASSIUM CHLORIDE 10 MEQ/50ML IV SOLN
10.0000 meq | INTRAVENOUS | Status: AC
  Administered 2019-08-25 (×3): 10 meq via INTRAVENOUS
  Filled 2019-08-25 (×2): qty 50

## 2019-08-25 MED FILL — Medication: Qty: 1 | Status: AC

## 2019-08-25 NOTE — Progress Notes (Signed)
Spoke w/ CCM MD, ok to transition pt to [place] as tolerated, Bipap at night.

## 2019-08-25 NOTE — Progress Notes (Signed)
    Pt in and out of junctional rhythm ? Retrograde conduction. Asymptomatic. F/u BMP shows persistent hypokalemia at 2.7. Mg 2.4. Plan aggressive K repletion. Continue to monitor on tele.   Nicholas Caldwell [employer]

## 2019-08-25 NOTE — Progress Notes (Addendum)
CRITICAL VALUE ALERT  Critical Value:  K- 2.6  Date & Time Notied:  0165 08/25/19  Provider Notified: E link  Orders Received/Actions taken: awaiting orders- unable to utilize e-link- K replacement protocol r/t Pt GFR & BUN results.

## 2019-08-25 NOTE — Progress Notes (Signed)
[place] Progress Note Patient Name: Nicholas Caldwell DOB: 16-Sep-1946 MRN: [id number]   Date of Service  08/25/2019  HPI/Events of Note  K+ 2.6  eICU Interventions  Electrolyte replacement protocol (modified for renal dysfunction) ordered.        Kerry Kass Wilmarie Sparlin 08/25/2019, 6:15 AM

## 2019-08-25 NOTE — Progress Notes (Signed)
NAME:  Nicholas Caldwell, MRN:  [id number], DOB:  1946/04/10, LOS: 5 ADMISSION DATE:  08/20/2019, CONSULTATION DATE:  08/21/19 REFERRING MD:  Audelia Hives , CHIEF COMPLAINT:  NA  Brief History   73 y/o M admitted on 10/17 after suffering a PEA arrest (required 12 min CPR before ROSC).  Recent admit from 10/6-10/12 for RLL PNA, CHF decompensation and COPD exacerbation. CBG's noted to be >400, weight up 4lbs.    Patient was transferred out of ICU 10/20 to floor. At some point early afternoon he received oxycodone and was placed on 5L [place]. O2 sats consistently documented at 98-99%. He became encephalopathic and gradually obtunded. PCCM called for re-consult.  Past Medical History  HTN, PAF - on eliquis, ACD, HfrEF, COPD, OSA  Significant Hospital Events   10/17 Admit post PEA arrest, intubated 10/18 Cooling protocol stopped as pt awake / alert,extubated 10/20 tx to floor, then became obtunded gradually. Intubated and tx back to ICU 10/21 - intubated after an opioid. Still poorly responsive. Occ has moved all 4s but more upper. On lasix gtt and amio gtt. Not on sedation gtt   Consults:  Cardiology   Procedures:  ETT 10/17 >> 10/18 R IJ TLC 10/17 >>  Significant Diagnostic Tests:  CT Head 10/18 >> negative for acute abnormality  ECHO 10/18> LVEF 63-84% Severe RV systolic dysfunction.   Micro Data:  COVID 10/18 >> negative  BCx2 10/18 >>  GPC 1/4 bottles      Antimicrobials:  Vanco 10/17 >> 10/18  Zosyn 10/18 >> 10/22   Interim history/subjective:   08/25/2019 - AKI better 2.40 ,, K low and repleted. Off amio gtt following bradycardia overnight. On lasix gtt On vent. 40% fio2, Not on pressoprs. Makinn urine. Awake and doing SBT but CXR very wet and EKG with sinus brady  P{er pharmacist - post prior extubation patien demanded his percocet back and this resulted in reintubaiton. Negative 4L since admit   Objective   Blood pressure 119/64, pulse 62, temperature 98.7 F (37.1 C),  temperature source Oral, resp. rate (!) 24, height _0  (1.803 m), weight 114.6 kg, SpO2 95 %. CVP:  [6 mmHg-16 mmHg] 9 mmHg  Vent Mode: CPAP;PSV FiO2 (%):  [40 %] 40 % Set Rate:  [24 bmp] 24 bmp Vt Set:  [580 mL] 580 mL PEEP:  [5 cmH20] 5 cmH20 Pressure Support:  [10 TXM46-80 cmH20] 10 cmH20 Plateau Pressure:  [24 cmH20-29 cmH20] 26 cmH20   Intake/Output Summary (Last 24 hours) at 08/25/2019 0827 Last data filed at 08/25/2019 0800 Gross per 24 hour  Intake 1699.88 ml  Output 2350 ml  Net -650.12 ml   Filed Weights   08/21/19 0032 08/21/19 0210 08/25/19 0455  Weight: 122.5 kg 124.2 kg 114.6 kg   General Appearance:  Looks critically ill but better. Doing SBT on vent Head:  Normocephalic, without obvious abnormality, atraumatic Eyes:  PERRL - yes, conjunctiva/corneas - muddy     Ears:  Normal external ear canals, both ears Nose:  G tube - no Throat:  ETT TUBE - yes , OG tube - yes Neck:  Supple,  No enlargement/tenderness/nodules Lungs: Clear to auscultation bilaterally, Ventilator   Synchrony - yes Heart:  S1 and S2 normal, no murmur, CVP - x.  Pressors - none Abdomen:  Soft, no masses, no organomegaly Genitalia / Rectal:  Not done Extremities:  Extremities- intact Skin:  ntact in exposed areas . Sacral area - not examined Neurologic:  Sedation - none -> RASS - +1 .  Moves all 4s - yes. CAM-ICU - neg . Orientation - able to follow commands        Resolved Hospital Problem list     Assessment & Plan:  OSA - noncompliant with NIMV at home.  - qHS BiPAP and PRN during waking hours/nap once able to be extubated - take care to not hyper-oxygenate   AECOPD -continue prednisone 23m qD  - wil lwrite out taper 08/26/19 -continue pulmicort + brovana  -TID duoneb  -holding home symbicort   Acute hypercarbic respiratory failure: suspected in the setting of opioid administration and decompensated OSA. REtinbuated for same 08/23/2019   08/25/2019 - > improved doing SBT  but at risk for asking for his pain meds and cxr very wet. Negative only 4L   Plan-  - SBT as tolerated . Otherwise PRVC - Might hold off extubation 08/25/2019 V Extubate to BiPAP - HOP > 30 degrees    PEA Arrest CPR x 12 minute Suspect primary pulmonary as patient was not wearing bipap following recent discharge  EKG unchanged from prior admit, troponin with mild leak / likely demand. CT head negative.   08/25/2019  - normal BP/HR . Offamio gtt. On lasix gtt  P -Cardiology following    PAF- On Eliquis at home.  Episode of bradycardia in ER, suspect respiratory driven / hypercarbia Bradycardia Hx HTN, HLD - continue eliquis - PRN metoprolol for SBP >170 - Continue to hold home bisoprolol for now     Acute on chronic systolic and diastolic heart failure LVEF 663-78%Sever RV systolic dysfunction   158/85/0277 - Dr MAundra Dubinfolliwing  P  -Heart Failure following -Diuresis per cards - but will add extra 1 dose lasix -Strict I&O  AKI on CKD3 Baseline sr cr ~ 1.7  Recent Labs  Lab 08/22/19 0231 08/23/19 0454 08/24/19 0449 08/24/19 1536 08/25/19 0445  CREATININE 2.60* 2.60* 2.37* 2.44* 2.40*   Creat improving  P -Trend BMP  Electrolyte imbalance  Plan  - K repleted  - monitor   DM 2: Hgb A1c 7.4 on 10/6.   - lantus 10 units daily - SSI, resistant scale    Possilbe Sepsis with PCT 0.41 and low grade fever on 08/23/2019 On abx since 08/21/2019   08/25/2019 - afebrle. PCT likely falsely elevated wit AKI  Plan Check trach aspriate culture but dc zosyn  Acute encephalopathy - CT head negatvive 08/23/2019  . - 08/25/2019 improved with clearance of his opioid rechallenge  Plan Monitor   Chronic Pain - on opioids  - 08/25/2019 -opioid rechallenge causes resp depression in setting of wet lungs, AKI etc., He asks for his opioids soon after extubaiton  Plan  - can be a challenging situation  - aim for more diuresis before extubation   Best  practice:  Diet:  NPO Pain/Anxiety/Delirium protocol (if indicated): PRN, RASS goal 0 to -1 VAP protocol (if indicated): yes DVT prophylaxis: Eliquis GI prophylaxis: Pepcid BID Glucose control: SSI, lantus Mobility: NA Code Status: Full code  Family Communication: none at bedside  Disposition: ICU     ATTESTATION & SIGNATURE   The patient JPerseus Westallis critically ill with multiple organ systems failure and requires high complexity decision making for assessment and support, frequent evaluation and titration of therapies, application of advanced monitoring technologies and extensive interpretation of multiple databases.   Critical Care Time devoted to patient care services described in this note is  30  Minutes. This time reflects time of care of this signee  Dr Brand Males. This critical care time does not reflect procedure time, or teaching time or supervisory time of PA/NP/Med student/Med Resident etc but could involve care discussion time     Dr. Brand Males, M.D., [facility].C.P Pulmonary and Critical Care Medicine Staff Physician [facility] Pager: [phone], If no answer or between  15:00h - 7:00h: call [phone]  08/25/2019 9:14 AM     LABS    PULMONARY Recent Labs  Lab 08/20/19 2253  08/23/19 2000 08/23/19 2121 08/24/19 0455 08/24/19 1159 08/25/19 0500  PHART 7.339*  --  7.123* 7.380  --  7.483*  --   PCO2ART 66.6*  --  CRITICAL RESULT CALLED TO, READ BACK BY AND VERIFIED WITH: 74.8*  --  56.5*  --   PO2ART 98.0  --  74.5* 110.0*  --  64.0*  --   HCO3 36.2*  --  45.5* 44.4*  --  42.4*  --   TCO2 38*  --   --  47*  --  44*  --   O2SAT 97.0   < > 90.4 98.0 68.0 93.0 63.3   < > = values in this interval not displayed.    CBC Recent Labs  Lab 08/23/19 0454  08/24/19 0449 08/24/19 1159 08/25/19 0445  HGB 9.0*   < > 8.8* 10.2* 8.9*  HCT 30.5*   < > 30.9* 30.0* 30.2*  WBC 7.2  --  6.8  --  6.0   PLT 349  --  322  --  334   < > = values in this interval not displayed.    COAGULATION Recent Labs  Lab 08/21/19 0017 08/21/19 0602 08/21/19 1201  INR 1.4* 1.5* 1.6*    CARDIAC  No results for input(s): TROPONINI in the last 168 hours. No results for input(s): PROBNP in the last 168 hours.   CHEMISTRY Recent Labs  Lab 08/21/19 0158 08/22/19 0231 08/22/19 0934 08/23/19 0454 08/23/19 2121 08/24/19 0449 08/24/19 1159 08/24/19 1536 08/24/19 1922 08/25/19 0445  NA 136 141  --  145 144 146* 145 149*  --  149*  K 4.5 4.0  --  3.3* 3.9 2.8* 3.2* 3.4*  --  2.6*  CL 89* 93*  --  93*  --  95*  --  97*  --  99  CO2 32 34*  --  39*  --  37*  --  36*  --  36*  GLUCOSE 198* 129*  --  106*  --  111*  --  169*  --  156*  BUN 67* 72*  --  70*  --  65*  --  64*  --  75*  CREATININE 2.53* 2.60*  --  2.60*  --  2.37*  --  2.44*  --  2.40*  CALCIUM 8.4* 8.5*  --  8.8*  --  9.2  --  9.0  --  9.2  MG 2.4  --  2.4 2.3  --  2.4  --   --   --  2.4  PHOS 5.2*  --   --  4.5  --   --   --   --  4.0 2.5   Estimated Creatinine Clearance: 36.3 mL/min (A) (by C-G formula based on SCr of 2.4 mg/dL (H)).   LIVER Recent Labs  Lab 08/20/19 2210 08/21/19 0017 08/21/19 0602 08/21/19 1201  AST 26  --   --   --   ALT 31  --   --   --  ALKPHOS 172*  --   --   --   BILITOT 0.6  --   --   --   PROT 6.3*  --   --   --   ALBUMIN 3.1*  --   --   --   INR  --  1.4* 1.5* 1.6*     INFECTIOUS Recent Labs  Lab 08/21/19 0855 08/22/19 0231 08/23/19 0454  PROCALCITON 0.74 0.73 0.41     ENDOCRINE CBG (last 3)  Recent Labs    08/24/19 2327 08/25/19 0344 08/25/19 0806  GLUCAP 223* 157* 125*         IMAGING x48h  - image(s) personally visualized  -   highlighted in bold Ct Head Wo Contrast  Result Date: 08/23/2019 CLINICAL DATA:  Altered LOC EXAM: CT HEAD WITHOUT CONTRAST TECHNIQUE: Contiguous axial images were obtained from the base of the skull through the vertex without  intravenous contrast. COMPARISON:  CT brain 08/21/2019 FINDINGS: Brain: No acute territorial infarction, hemorrhage or intracranial mass. Chronic infarcts in the right-greater-than-left cerebellum. Mild small vessel ischemic changes of the white matter. Stable ventricle size. Vascular: No hyperdense vessels.  Carotid vascular calcification Skull: Normal. Negative for fracture or focal lesion. Sinuses/Orbits: No acute finding. Other: None IMPRESSION: 1. No CT evidence for acute intracranial abnormality. 2. Mild small vessel ischemic changes of the white matter. Chronic cerebellar infarcts Electronically Signed   By: Donavan Foil M.D.   On: 08/23/2019 23:25   Dg Chest Port 1 View  Result Date: 08/25/2019 CLINICAL DATA:  Respiratory failure. Endotracheal tube present. EXAM: PORTABLE CHEST 1 VIEW COMPARISON:  Radiograph 08/23/2019 CT 08/09/2019 FINDINGS: Endotracheal tube tip at the thoracic inlet. Enteric tube tip and side-port below the diaphragm not included in the field of view. Right internal jugular central line in the upper SVC. Cardiomegaly is unchanged. Bilateral pulmonary opacities and pleural effusions, not significantly changed yesterday. No visualized pneumothorax. IMPRESSION: 1. Unchanged bilateral pulmonary opacities which may represent pulmonary edema, pneumonia, or ARDS. Bilateral pleural effusions. Unchanged cardiomegaly. 2. Stable support apparatus. Electronically Signed   By: Keith Rake M.D.   On: 08/25/2019 06:30   Dg Chest Port 1 View  Result Date: 08/23/2019 CLINICAL DATA:  Post intubation EXAM: PORTABLE CHEST 1 VIEW COMPARISON:  August 22, 2019 FINDINGS: ETT is 2 cm above the carina. NG tube is below the diaphragm. A right-sided central venous catheter seen with the tip at the superior cavoatrial junction. Slight interval worsening in the hazy airspace opacity in both lungs. There is blunting of the left costophrenic angle, likely small effusion. The cardiomediastinal silhouette  is unchanged. IMPRESSION: ET tube 2 cm above the carina. NG tube below the diaphragm Slight interval worsening in the hazy airspace opacity in both lung, which could be due to worsening edema and/or developing ARDS. Probable small left pleural effusion Electronically Signed   By: Prudencio Pair M.D.   On: 08/23/2019 21:34

## 2019-08-25 NOTE — Progress Notes (Addendum)
Patient ID: Nicholas Caldwell, male   DOB: 1946-07-08, 73 y.o.   MRN: [id number]     Advanced Heart Failure Rounding Note  PCP-Cardiologist: No primary care provider on file.   Subjective:    Initially in CCU with PEA arrest likely due to pain meds and noncompliance with Bipap.  Extubated 10/19.   10/21 was transferred to floor, found to be obtunded (had had oxycodone) and unable to protect airway with hypercarbic respiratory failure.  He was intubated again.    This morning, he is off sedation, alert. Remains on vent. BP stable. MAPs in the upper 70s.   CVP improved, at 9 today (15 yesterday) with co-ox 63%. 2L out in UOP yesterday. Net I/Os -4L  Had sinus bradycardia overnight, w/ sustained rates in the 40s. Amiodarone discontinued.    Objective:   Weight Range: 114.6 kg Body mass index is 35.24 kg/m.   Vital Signs:   Temp:  [98.2 F (36.8 C)-98.9 F (37.2 C)] 98.7 F (37.1 C) (10/22 0802) Pulse Rate:  [29-105] 62 (10/22 0756) Resp:  [15-28] 24 (10/22 0756) BP: (91-151)/(51-84) 119/64 (10/22 0756) SpO2:  [75 %-100 %] 95 % (10/22 0802) FiO2 (%):  [40 %] 40 % (10/22 0802) Weight:  [114.6 kg] 114.6 kg (10/22 0455) Last BM Date: 08/24/19  Weight change: Filed Weights   08/21/19 0032 08/21/19 0210 08/25/19 0455  Weight: 122.5 kg 124.2 kg 114.6 kg    Intake/Output:   Intake/Output Summary (Last 24 hours) at 08/25/2019 0856 Last data filed at 08/25/2019 0800 Gross per 24 hour  Intake 1699.88 ml  Output 2350 ml  Net -650.12 ml      Physical Exam   CVP 9 General: Intubated, alert Neck: elevated JVD, no thyromegaly or thyroid nodule.  Lungs: intubated Distant BS bilaterally.  CV: Nondisplaced PMI.  Heart mildly tachy, irregular S1/S2, no S3/S4, no murmur.  1+ ankle edema. Bilateral unna boots  Abdomen: Soft, nontender, no hepatosplenomegaly, no distention.  Skin: Intact without lesions or rashes.  Neurologic: on vent but alert, moving extremities   Extremities: No  clubbing or cyanosis.  HEENT: Normal.    Telemetry   NSR 80s.  Personally reviewed.   EKG    Sinus 59 incomplete RBBB  Labs    CBC Recent Labs    08/24/19 0449 08/24/19 1159 08/25/19 0445  WBC 6.8  --  6.0  HGB 8.8* 10.2* 8.9*  HCT 30.9* 30.0* 30.2*  MCV 91.2  --  88.0  PLT 322  --  010   Basic Metabolic Panel Recent Labs    08/24/19 0449  08/24/19 1536 08/24/19 1922 08/25/19 0445  NA 146*   < > 149*  --  149*  K 2.8*   < > 3.4*  --  2.6*  CL 95*  --  97*  --  99  CO2 37*  --  36*  --  36*  GLUCOSE 111*  --  169*  --  156*  BUN 65*  --  64*  --  75*  CREATININE 2.37*  --  2.44*  --  2.40*  CALCIUM 9.2  --  9.0  --  9.2  MG 2.4  --   --   --  2.4  PHOS  --   --   --  4.0 2.5   < > = values in this interval not displayed.   Liver Function Tests No results for input(s): AST, ALT, ALKPHOS, BILITOT, PROT, ALBUMIN in the last 72 hours. No results for  input(s): LIPASE, AMYLASE in the last 72 hours. Cardiac Enzymes No results for input(s): CKTOTAL, CKMB, CKMBINDEX, TROPONINI in the last 72 hours.  BNP: BNP (last 3 results) Recent Labs    08/20/19 2210  BNP 4,121.7*    ProBNP (last 3 results) No results for input(s): PROBNP in the last 8760 hours.   D-Dimer No results for input(s): DDIMER in the last 72 hours. Hemoglobin A1C No results for input(s): HGBA1C in the last 72 hours. Fasting Lipid Panel No results for input(s): CHOL, HDL, LDLCALC, TRIG, CHOLHDL, LDLDIRECT in the last 72 hours. Thyroid Function Tests No results for input(s): TSH, T4TOTAL, T3FREE, THYROIDAB in the last 72 hours.  Invalid input(s): FREET3  Other results:   Imaging    Dg Chest Port 1 View  Result Date: 08/25/2019 CLINICAL DATA:  Respiratory failure. Endotracheal tube present. EXAM: PORTABLE CHEST 1 VIEW COMPARISON:  Radiograph 08/23/2019 CT 08/09/2019 FINDINGS: Endotracheal tube tip at the thoracic inlet. Enteric tube tip and side-port below the diaphragm not included  in the field of view. Right internal jugular central line in the upper SVC. Cardiomegaly is unchanged. Bilateral pulmonary opacities and pleural effusions, not significantly changed yesterday. No visualized pneumothorax. IMPRESSION: 1. Unchanged bilateral pulmonary opacities which may represent pulmonary edema, pneumonia, or ARDS. Bilateral pleural effusions. Unchanged cardiomegaly. 2. Stable support apparatus. Electronically Signed   By: Keith Rake M.D.   On: 08/25/2019 06:30     Medications:     Scheduled Medications: . acetaZOLAMIDE  250 mg Per Tube BID  . apixaban  5 mg Per Tube BID  . arformoterol  15 mcg Nebulization BID  . budesonide (PULMICORT) nebulizer solution  0.5 mg Nebulization BID  . chlorhexidine gluconate (MEDLINE KIT)  15 mL Mouth Rinse BID  . Chlorhexidine Gluconate Cloth  6 each Topical Daily  . feeding supplement (PRO-STAT SUGAR FREE 64)  60 mL Per Tube BID  . insulin aspart  0-20 Units Subcutaneous Q4H  . insulin glargine  10 Units Subcutaneous QHS  . ipratropium-albuterol  3 mL Nebulization BID  . mouth rinse  15 mL Mouth Rinse 10 times per day  . predniSONE  40 mg Per Tube Q breakfast  . sodium chloride flush  10-40 mL Intracatheter Q12H    Infusions: . sodium chloride 10 mL/hr at 08/25/19 0600  . amiodarone 30 mg/hr (08/25/19 0636)  . famotidine (PEPCID) IV Stopped (08/24/19 2158)  . feeding supplement (VITAL HIGH PROTEIN) 50 mL/hr at 08/25/19 0600  . furosemide 120 mg (08/25/19 0816)  . piperacillin-tazobactam (ZOSYN)  IV 3.375 g (08/25/19 0818)  . potassium chloride 10 mEq (08/25/19 0800)    PRN Medications: sodium chloride, acetaminophen **OR** acetaminophen (TYLENOL) oral liquid 160 mg/5 mL, fentaNYL (SUBLIMAZE) injection, fentaNYL (SUBLIMAZE) injection, metoprolol tartrate, midazolam, midazolam, naLOXone (NARCAN)  injection, sodium chloride flush    Patient Profile   Nicholas Brodenis a 73 y.o.malewith a history of chronic systolic CHFdue  to NICM (EF 40-45%)with moderate RV failure, COPD on 3 L O2, DM, HTN, OHS/OSA on CPAP, PAF s/p DCCV, hyperlipidemia, CAD, noncompliance, multiple admissions over the last 6 months due to HF and respiratory failure, readmitted after out of hospital PEA arrest. AHF consulted for management of acute on chronic biventricular failure (predominant RV failure), at the request of Dr. Elsworth Soho, Critical Care Medicine.   Assessment/Plan    1. Acute on chronic hypoxemic/hypercarbic respiratory failure: Baseline COPD and OHS/OSA.  He was not compliant with Bipap at home and was using opiates.  He had a PEA  arrest and was intubated.  Got oxycodone on 10/20 while on floor, became obtunded with acute on chronic hypoxemic/hypercarbic respiratory failure and was intubated again.  - Continue diuresis.  - Limit opioids.  - Wean vent per CCM.   1. Chronic systolic => diastolic CHF: With prominent RV failure, likely related to COPD/OSA/OHS. TEE in 3/20 with EF 40-45%, severely dilated RV with moderately decreased systolic function. Echo this admission showed EF up to 60-65% but with severely dilated and dysfunctional RV. He has had multiple recent admissions with CHF and COPD exacerbations. He is chronically NYHA class IIIb. He is on high doses of diuretics at home but has remained volume overloaded as outpatient.  Now significantly volume overloaded with intractable RV failure as inpatient. CVP 9 today, co-ox 63%. Wt down 21 lb since admit (273>>252lb) - Continue Lasix 120 mg IV bid, will give Diamox 250 mg bid with elevated HCO3.  - amiodarone discontinued overnight due to bradycardia. Appears to be in NSR -Bidil on hold with soft BP.  2.Atrial fibrillation: Paroxysmal. He has history of CVA. He had DCCV in 3/20 and again in 5/20.  He failed amiodarone in the past and cannot take Tikosyn due to baseline prolonged QT interval.  went back into atrial fibrillation this admit with RVR. Amiodarone started but discontinued  due to bradycardia. Appears to be in NSR. HR in the 80s.  - Continue apixaban 5 mg bid.   - Poor ablation candidate with LA size.  3. COPD:No longer smokes. Continue homeoxygen. CT chest showed emphysema. 4.OHS/OSA: Continue 3 L oxygen during the day and CPAP at night at baseline.   - Needs to avoid narcotics.   5.CAD: Nonobstructive disease in 2/20. No chest pain. - Continue crestor 20 mg daily. - He is on apixaban so no ASA.   6. AKI on CKD Stage 3: Creatinine stable past 24 hrs at 2.40 7. Pleural effusion: S/p recent thoracentesis for right pleural effusion. Effusions remain present.  8. Hypokalemia: K 2.6. Mg normal at 2.4. Aggressively replace K. IV ordered 10 mEq q1h x 4. - repeat BMET this afternoon.   Lyda Jester 08/25/2019 8:56 AM   Patient seen with PA, agree with the above note.   Weight is down a lot with diuresis though I/Os not really reflective of this.  However, CVP now 9.  He went into NSR last night and was bradycardic in 40s so amiodarone was stopped.    Today, he is awake on vent, alert/interactive.  Renal indices fairly stable.   General: Intubated, awake.  Neck: JVP 8 cm, no thyromegaly or thyroid nodule.  Lungs: Mild crackles at bases.  CV: Nondisplaced PMI.  Heart regular S1/S2, no S3/S4, no murmur.  Legs wrapped.   Abdomen: Soft, nontender, no hepatosplenomegaly, no distention.  Skin: Intact without lesions or rashes.  Neurologic: Alert on vent  Extremities: No clubbing or cyanosis.  HEENT: Normal.   Volume status seems improved, CVP 9.  He got Lasix 120 mg IV this morning.   - Will stop IV Lasix, start him on po diuretics tomorrow.  Can continue Diamox for today then stop.   I would like to keep him in NSR.  HR 80s and regular today, hard to tell if NSR (versus atypical flutter).  - He will need ECG.  - Restart amiodarone po, will see if he can tolerate 200 mg bid for now.  He was mildly bradycardic (HR in 40s) on amiodarone gtt.  -  Continue apixaban.   Respiratory  status improved, may be able to extubate. Avoid narcotics and make sure to use CPAP at night.   CRITICAL CARE Performed by: Loralie Champagne  Total critical care time: 35 minutes  Critical care time was exclusive of separately billable procedures and treating other patients.  Critical care was necessary to treat or prevent imminent or life-threatening deterioration.  Critical care was time spent personally by me on the following activities: development of treatment plan with patient and/or surrogate as well as nursing, discussions with consultants, evaluation of patient's response to treatment, examination of patient, obtaining history from patient or surrogate, ordering and performing treatments and interventions, ordering and review of laboratory studies, ordering and review of radiographic studies, pulse oximetry and re-evaluation of patient's condition.  Loralie Champagne 08/25/2019 10:43 AM

## 2019-08-25 NOTE — Progress Notes (Signed)
Physical Therapy Treatment Patient Details Name: Nicholas Caldwell MRN: [id number] DOB: 1946/06/01 Today's Date: 08/25/2019    History of Present Illness 73 y/o M admitted on 10/17 after suffering a PEA arrest (required 12 min CPR before ROSC).  Recent admit from 10/6-10/12 for RLL PNA, CHF decompensation and COPD exacerbation. CBG's noted to be >400, weight up 4lbs. Pt became somnolent on 10/20 resulting in re-intubation.    PT Comments    PT performing re-evaluation 2/2 pt reintubation on 10/20. Pt limited by dizziness and fatigue during session, only able to tolerate bed mobility and sitting edge of bed. Pt requires significant physical assistance to perform bed mobility at this time. Pt will benefit form acute PT POC to reduce falls risk and caregiver burden. PT POC and goals updated according to pt current functional status.   Follow Up Recommendations  SNF     Equipment Recommendations  None recommended by PT    Recommendations for Other Services       Precautions / Restrictions Precautions Precautions: Fall Precaution Comments: intubated Restrictions Weight Bearing Restrictions: No    Mobility  Bed Mobility Overal bed mobility: Needs Assistance Bed Mobility: Supine to Sit;Sit to Supine     Supine to sit: Mod assist;+2 for physical assistance Sit to supine: Max assist;+2 for physical assistance      Transfers                    Ambulation/Gait                 Stairs             Wheelchair Mobility    Modified Rankin (Stroke Patients Only)       Balance Overall balance assessment: Needs assistance Sitting-balance support: Bilateral upper extremity supported;Feet supported Sitting balance-Leahy Scale: Fair Sitting balance - Comments: minG for sitting edge of bed with BUE support                                    Cognition Arousal/Alertness: Awake/alert Behavior During Therapy: WFL for tasks  assessed/performed Overall Cognitive Status: History of cognitive impairments - at baseline                                        Exercises      General Comments        Pertinent Vitals/Pain Pain Assessment: Faces Faces Pain Scale: Hurts little more Pain Location: unable to report Pain Intervention(s): Limited activity within patient's tolerance    Home Living                      Prior Function            PT Goals (current goals can now be found in the care plan section) Acute Rehab PT Goals Patient Stated Goal: to get stronger PT Goal Formulation: With patient Time For Goal Achievement: 09/08/19 Potential to Achieve Goals: Fair Progress towards PT goals: Progressing toward goals(goals updated this session)    Frequency    Min 2X/week      PT Plan Discharge plan needs to be updated    Co-evaluation              AM-PAC PT "6 Clicks" Mobility   Outcome Measure  Help needed turning from your back  to your side while in a flat bed without using bedrails?: A Lot Help needed moving from lying on your back to sitting on the side of a flat bed without using bedrails?: A Lot Help needed moving to and from a bed to a chair (including a wheelchair)?: A Lot Help needed standing up from a chair using your arms (e.g., wheelchair or bedside chair)?: A Lot Help needed to walk in hospital room?: Total Help needed climbing 3-5 steps with a railing? : Total 6 Click Score: 10    End of Session Equipment Utilized During Treatment: Gait belt;Oxygen;Other (comment)(vent) Activity Tolerance: Patient limited by fatigue Patient left: in bed;with call bell/phone within reach;with bed alarm set Nurse Communication: Mobility status PT Visit Diagnosis: Other abnormalities of gait and mobility (R26.89)     Time: 1027-1050 PT Time Calculation (min) (ACUTE ONLY): 23 min  Charges:                        Zenaida Niece, PT, DPT Acute  Rehabilitation Pager: [phone]    Zenaida Niece 08/25/2019, 1:37 PM

## 2019-08-25 NOTE — Progress Notes (Signed)
Pt in and out of vent rhythm all shift, potentially accelerated junctional. Rate controlled, BP wnl, no distress.  Spoke w/ Heart Failure PA to relay.

## 2019-08-25 NOTE — Procedures (Signed)
Extubation Procedure Note  Patient Details:   Name: Kardell Virgil DOB: 06-23-1946 MRN: [id number]   Airway Documentation:    Vent end date: 08/21/19 Vent end time: 1125   Evaluation  O2 sats: stable throughout Complications: No apparent complications Patient did tolerate procedure well. Bilateral Breath Sounds: Coarse crackles   Yes   Patient was extubated to a 4L [place] and then to bipap per MD order due to patients history of pulmonary edema. Cuff leak was heard. No stridor noted. RN at bedside with RT during extubation. Patient tolerated well.  Renato Gails Granvil Djordjevic 08/25/2019, 11:40 AM

## 2019-08-25 NOTE — Progress Notes (Signed)
D/w DR [name]   1. Ok to extubate 2. Extubate to bippa 3. No opiopids or benzo post extubation 4. Can consider precedex if needed     SIGNATURE    Dr. Brand Males, M.D., F.C.C.P,  Pulmonary and Critical Care Medicine Staff Physician, [place] Director - Interstitial Lung Disease  Program  P[facility] [place], [place], [address]  Pager: [phone], If no answer or between  15:00h - 7:00h: call [phone] Telephone: [phone]  10:54 AM 08/25/2019

## 2019-08-26 ENCOUNTER — Inpatient Hospital Stay (HOSPITAL_COMMUNITY): Payer: Medicare Other

## 2019-08-26 DIAGNOSIS — N1831 Chronic kidney disease, stage 3a: Secondary | ICD-10-CM

## 2019-08-26 DIAGNOSIS — E876 Hypokalemia: Secondary | ICD-10-CM | POA: Diagnosis not present

## 2019-08-26 DIAGNOSIS — I469 Cardiac arrest, cause unspecified: Secondary | ICD-10-CM | POA: Diagnosis not present

## 2019-08-26 DIAGNOSIS — I5043 Acute on chronic combined systolic (congestive) and diastolic (congestive) heart failure: Secondary | ICD-10-CM | POA: Diagnosis not present

## 2019-08-26 DIAGNOSIS — I10 Essential (primary) hypertension: Secondary | ICD-10-CM

## 2019-08-26 DIAGNOSIS — N171 Acute kidney failure with acute cortical necrosis: Secondary | ICD-10-CM

## 2019-08-26 DIAGNOSIS — R7989 Other specified abnormal findings of blood chemistry: Secondary | ICD-10-CM

## 2019-08-26 DIAGNOSIS — D638 Anemia in other chronic diseases classified elsewhere: Secondary | ICD-10-CM | POA: Diagnosis not present

## 2019-08-26 DIAGNOSIS — E0865 Diabetes mellitus due to underlying condition with hyperglycemia: Secondary | ICD-10-CM

## 2019-08-26 DIAGNOSIS — L89899 Pressure ulcer of other site, unspecified stage: Secondary | ICD-10-CM

## 2019-08-26 DIAGNOSIS — G4733 Obstructive sleep apnea (adult) (pediatric): Secondary | ICD-10-CM

## 2019-08-26 DIAGNOSIS — J9602 Acute respiratory failure with hypercapnia: Secondary | ICD-10-CM | POA: Diagnosis not present

## 2019-08-26 DIAGNOSIS — Z9119 Patient's noncompliance with other medical treatment and regimen: Secondary | ICD-10-CM

## 2019-08-26 LAB — COOXEMETRY PANEL
Carboxyhemoglobin: 1.6 % — ABNORMAL HIGH (ref 0.5–1.5)
Methemoglobin: 1.2 % (ref 0.0–1.5)
O2 Saturation: 59.1 %
Total hemoglobin: 11.3 g/dL — ABNORMAL LOW (ref 12.0–16.0)

## 2019-08-26 LAB — CBC
HCT: 31.3 % — ABNORMAL LOW (ref 39.0–52.0)
Hemoglobin: 8.9 g/dL — ABNORMAL LOW (ref 13.0–17.0)
MCH: 25.4 pg — ABNORMAL LOW (ref 26.0–34.0)
MCHC: 28.4 g/dL — ABNORMAL LOW (ref 30.0–36.0)
MCV: 89.2 fL (ref 80.0–100.0)
Platelets: 328 10*3/uL (ref 150–400)
RBC: 3.51 MIL/uL — ABNORMAL LOW (ref 4.22–5.81)
RDW: 20.8 % — ABNORMAL HIGH (ref 11.5–15.5)
WBC: 7.8 10*3/uL (ref 4.0–10.5)
nRBC: 0.5 % — ABNORMAL HIGH (ref 0.0–0.2)

## 2019-08-26 LAB — GLUCOSE, CAPILLARY
Glucose-Capillary: 108 mg/dL — ABNORMAL HIGH (ref 70–99)
Glucose-Capillary: 96 mg/dL (ref 70–99)
Glucose-Capillary: 303 mg/dL — ABNORMAL HIGH (ref 70–99)
Glucose-Capillary: 163 mg/dL — ABNORMAL HIGH (ref 70–99)
Glucose-Capillary: 182 mg/dL — ABNORMAL HIGH (ref 70–99)

## 2019-08-26 LAB — BASIC METABOLIC PANEL
Anion gap: 11 (ref 5–15)
BUN: 70 mg/dL — ABNORMAL HIGH (ref 8–23)
CO2: 35 mmol/L — ABNORMAL HIGH (ref 22–32)
Calcium: 9.2 mg/dL (ref 8.9–10.3)
Chloride: 103 mmol/L (ref 98–111)
Creatinine, Ser: 2.05 mg/dL — ABNORMAL HIGH (ref 0.61–1.24)
GFR calc Af Amer: 37 mL/min — ABNORMAL LOW (ref >60)
GFR calc non Af Amer: 32 mL/min — ABNORMAL LOW (ref >60)
Glucose, Bld: 101 mg/dL — ABNORMAL HIGH (ref 70–99)
Potassium: 3.2 mmol/L — ABNORMAL LOW (ref 3.5–5.1)
Sodium: 149 mmol/L — ABNORMAL HIGH (ref 135–145)

## 2019-08-26 LAB — MAGNESIUM: Magnesium: 2.5 mg/dL — ABNORMAL HIGH (ref 1.7–2.4)

## 2019-08-26 MED ORDER — POTASSIUM CHLORIDE 20 MEQ/15ML (10%) PO SOLN
40.0000 meq | ORAL | Status: AC
  Administered 2019-08-26: 40 meq via ORAL
  Filled 2019-08-26: qty 30

## 2019-08-26 MED ORDER — TORSEMIDE 20 MG PO TABS
100.0000 mg | ORAL_TABLET | Freq: Two times a day (BID) | ORAL | Status: DC
  Administered 2019-08-27 – 2019-08-29 (×5): 100 mg via ORAL
  Filled 2019-08-26 (×5): qty 5

## 2019-08-26 MED ORDER — POTASSIUM CHLORIDE 10 MEQ/100ML IV SOLN
10.0000 meq | INTRAVENOUS | Status: DC
  Filled 2019-08-26: qty 100

## 2019-08-26 MED ORDER — CHLORHEXIDINE GLUCONATE 0.12 % MT SOLN
15.0000 mL | Freq: Two times a day (BID) | OROMUCOSAL | Status: DC
  Administered 2019-08-26 – 2019-08-28 (×6): 15 mL via OROMUCOSAL
  Filled 2019-08-26 (×7): qty 15

## 2019-08-26 MED ORDER — FAMOTIDINE 20 MG PO TABS: 20.0000 mg | ORAL_TABLET | Freq: Two times a day (BID) | ORAL | Status: DC

## 2019-08-26 MED ORDER — FAMOTIDINE 20 MG PO TABS
20.0000 mg | ORAL_TABLET | Freq: Two times a day (BID) | ORAL | Status: DC
  Administered 2019-08-27 – 2019-08-29 (×5): 20 mg via ORAL
  Filled 2019-08-26 (×5): qty 1

## 2019-08-26 MED ORDER — POTASSIUM CHLORIDE 20 MEQ/15ML (10%) PO SOLN
40.0000 meq | ORAL | Status: DC
  Administered 2019-08-26: 08:00:00 40 meq
  Filled 2019-08-26: qty 30

## 2019-08-26 MED ORDER — AMIODARONE HCL 200 MG PO TABS
200.0000 mg | ORAL_TABLET | Freq: Two times a day (BID) | ORAL | Status: DC
  Administered 2019-08-26 – 2019-08-29 (×6): 200 mg via ORAL
  Filled 2019-08-26 (×6): qty 1

## 2019-08-26 MED ORDER — ROSUVASTATIN CALCIUM 20 MG PO TABS
20.0000 mg | ORAL_TABLET | Freq: Every day | ORAL | Status: DC
  Administered 2019-08-26 – 2019-08-28 (×3): 20 mg via ORAL
  Filled 2019-08-26 (×3): qty 1

## 2019-08-26 MED ORDER — FUROSEMIDE 10 MG/ML IJ SOLN
120.0000 mg | Freq: Two times a day (BID) | INTRAVENOUS | Status: AC
  Administered 2019-08-26 (×2): 120 mg via INTRAVENOUS
  Filled 2019-08-26: qty 12
  Filled 2019-08-26: qty 10
  Filled 2019-08-26 (×2): qty 12

## 2019-08-26 MED ORDER — POTASSIUM CHLORIDE 10 MEQ/50ML IV SOLN
10.0000 meq | INTRAVENOUS | Status: AC
  Administered 2019-08-26 (×4): 10 meq via INTRAVENOUS
  Filled 2019-08-26 (×4): qty 50

## 2019-08-26 MED ORDER — APIXABAN 5 MG PO TABS
5.0000 mg | ORAL_TABLET | Freq: Two times a day (BID) | ORAL | Status: DC
  Administered 2019-08-26 – 2019-08-29 (×6): 5 mg via ORAL
  Filled 2019-08-26 (×6): qty 1

## 2019-08-26 MED ORDER — ACETAZOLAMIDE 250 MG PO TABS
250.0000 mg | ORAL_TABLET | Freq: Two times a day (BID) | ORAL | Status: AC
  Administered 2019-08-26 (×2): 250 mg via ORAL
  Filled 2019-08-26 (×2): qty 1

## 2019-08-26 MED ORDER — TORSEMIDE 100 MG PO TABS: 100.0000 mg | ORAL_TABLET | Freq: Two times a day (BID) | ORAL | Status: DC

## 2019-08-26 MED ORDER — ENSURE MAX PROTEIN PO LIQD
11.0000 [oz_av] | Freq: Every day | ORAL | Status: DC
  Administered 2019-08-26 – 2019-08-28 (×2): 11 [oz_av] via ORAL
  Filled 2019-08-26 (×4): qty 330

## 2019-08-26 MED ORDER — PREDNISONE 20 MG PO TABS
40.0000 mg | ORAL_TABLET | Freq: Every day | ORAL | Status: DC
  Administered 2019-08-27 – 2019-08-29 (×3): 40 mg via ORAL
  Filled 2019-08-26 (×3): qty 2

## 2019-08-26 MED ORDER — ORAL CARE MOUTH RINSE
15.0000 mL | Freq: Two times a day (BID) | OROMUCOSAL | Status: DC
  Administered 2019-08-26 – 2019-08-28 (×6): 15 mL via OROMUCOSAL

## 2019-08-26 NOTE — TOC Initial Note (Signed)
Transition of Care [facility]) - Initial/Assessment Note    Patient Details  Name: Nicholas Caldwell MRN: [id number] Date of Birth: [birth year]  [facility]) CM/SW Contact:    Eileen Stanford, LCSW Phone Number: 08/26/2019, 11:34 AM  Clinical Narrative:     CSW spoke with pt and pt's wife present. Pt states he isn't going to SNF. Pt states he is agreeable to CIR however if he cant go there he would like to return home with his wife. Pt was getting HH PT through [facility] and would like to continue yet. Pt has a bedside commode, BIPAP, walker, cane, wheelchair, and transport chair at home.              Expected Discharge Plan: [place] Barriers to Discharge: Continued Medical Work up   Patient Goals and CMS Choice Patient states their goals for this hospitalization and ongoing recovery are:: "to get stronger" CMS Medicare.gov Compare Post Acute Care list provided to:: Patient Choice offered to / list presented to : NA  Expected Discharge Plan and Services Expected Discharge Plan: [place] In-house Referral: Clinical Social Work   Post Acute Care Choice: [place] arrangements for the past 2 months: [place]: PT [place]: [place] ([facility])(will resume services) Date HH Agency Contacted: 08/26/19 Time [place]: 1122 Representative spoke with at [place]: [place] Arrangements/Services Living arrangements for the past 2 months: [place] with:: Spouse Patient language and need for interpreter reviewed:: Yes Do you feel safe going back to the place where you live?: Yes      Need for Family Participation in Patient Care: Yes (Comment) Care giver support system in place?: Yes (comment)   Criminal Activity/Legal Involvement Pertinent to Current Situation/Hospitalization: No - Comment as needed  Activities of Daily Living Home  Assistive Devices/Equipment: Wheelchair, Environmental consultant (specify type), Shower chair with back, Bedside commode/3-in-1, CBG Meter, Cane (specify quad or straight) ADL Screening (condition at time of admission) Patient's cognitive ability adequate to safely complete daily activities?: No Is the patient deaf or have difficulty hearing?: No Does the patient have difficulty seeing, even when wearing glasses/contacts?: No Does the patient have difficulty concentrating, remembering, or making decisions?: No Patient able to express need for assistance with ADLs?: Yes Does the patient have difficulty dressing or bathing?: No Independently performs ADLs?: Yes (appropriate for developmental age) Does the patient have difficulty walking or climbing stairs?: No Weakness of Legs: None Weakness of Arms/Hands: None  Permission Sought/Granted Permission sought to share information with : Family Supports, Customer service manager    Share Information with NAME: [name]  Permission granted to share info w AGENCY: [place] granted to share info w Relationship: Spouse     Emotional Assessment Appearance:: Appears stated age Attitude/Demeanor/Rapport: Engaged Affect (typically observed): Accepting, Appropriate Orientation: : Oriented to Self, Oriented to Place, Oriented to  Time, Oriented to Situation Alcohol / Substance Use: Not Applicable Psych Involvement: No (comment)  Admission diagnosis:  Cardiac arrest ([place]) [I46.9] Encounter for central line placement [Z45.2] Patient Active Problem List   Diagnosis Date Noted  . Acute respiratory failure with hypercapnia ([place])   . Encounter for intubation   . Pressure injury of skin 08/21/2019  . Cardiac arrest ([place]) 08/20/2019  PCP:  [name], MD Pharmacy:   [place], [place] [phone] [place] [address] Phone: [phone] Fax:  [phone]  [facility] [place], [place] - [address] [place] [address] Phone: [phone] Fax: [place], [address] AT [facility] [place] & [place] [address] Phone: [phone] Fax: [phone]     Social Determinants of Health (SDOH) Interventions    Readmission Risk Interventions No flowsheet data found.

## 2019-08-26 NOTE — Progress Notes (Signed)
Pt refusing to wear BIPAP. RT and RN attempted to persuade pt to wear for his benefit. Pt still stating no that he wants to eat. RN explained to pt that he wont be able to eat at this time. Pt has been intubated several times this admission. RT will continue to monitor.

## 2019-08-26 NOTE — Progress Notes (Signed)
Nutrition Follow-up  DOCUMENTATION CODES:   Obesity unspecified  INTERVENTION:   - Ensure Max po daily, each supplement provides 150 kcal and 30 grams of protein  - Encourage adequate PO intake   NUTRITION DIAGNOSIS:   Inadequate oral intake related to inability to eat as evidenced by NPO status.  Progressing, pt now on 2 gram sodium diet  GOAL:   Patient will meet greater than or equal to 90% of their needs  Progressing  MONITOR:   PO intake, Supplement acceptance, Labs, Weight trends, Skin, I & O's  REASON FOR ASSESSMENT:   Ventilator, Consult Enteral/tube feeding initiation and management  ASSESSMENT:   73 year old male who presented to the ED on 10/17 after a witnessed PEA arrest. PMH of CAD, CHF, atrial fibrillation, HTN, COPD, T2DM, recent pneumonia, sleep apnea, CKD 3-4. Pt required intubation. TTM 36 degrees initiated.  10/18 - extubated 10/20 - obtunded, emergently intubated 10/22 - extubated  Discussed pt with RN and during ICU rounds.  Weight down 27 lbs since admit. Suspect weight loss related to negative fluid balance.  Spoke with pt and family member at bedside. Pt reports having a good appetite. RN reports pt consumed 100% of lunch.  Pt states that he had a good appetite PTA as well. Pt's family member reports pt's UBW was between 240-250 lbs.  Pt willing to drink an oral nutrition supplement to aid in meeting protein needs and promote wound healing. RD to order.  Meal Completion: 100% x 1 meal today  Medications reviewed and include: SSI q 4 hours, Lantus 10 units daily, KCl 40 mEq x 2, Prednisone, torsemide, IV Pepcid, IV Lasix 120 mg q 12 hours, IV KCl 10 mEq x 4 runs  Labs reviewed: sodium 149, potassium 3.2,  BUN 70, creatinine 2.05, magnesium 2.5, hemoglobin 8.9 CBG's: 96-191 x 24 hours  UOP: 3550 ml x 24 hours I/O's: -6.6 L since admit  NUTRITION - FOCUSED PHYSICAL EXAM:    Most Recent Value  Orbital Region  No depletion  Upper  Arm Region  No depletion  Thoracic and Lumbar Region  No depletion  Buccal Region  No depletion  Temple Region  No depletion  Clavicle Bone Region  Mild depletion  Clavicle and Acromion Bone Region  Mild depletion  Scapular Bone Region  Unable to assess  Dorsal Hand  No depletion  Patellar Region  No depletion  Anterior Thigh Region  No depletion  Posterior Calf Region  No depletion  Edema (RD Assessment)  Moderate [BLE]  Hair  Reviewed  Eyes  Reviewed  Mouth  Reviewed  Skin  Reviewed  Nails  Reviewed       Diet Order:   Diet Order            Diet 2 gram sodium Room service appropriate? Yes; Fluid consistency: Thin; Fluid restriction: 1500 mL Fluid  Diet effective now              EDUCATION NEEDS:   Education needs have been addressed  Skin:  Skin Assessment: Skin Integrity Issues: Skin Integrity Issues: Stage II: sacrum, right buttocks  Last BM:  08/26/19  Height:   Ht Readings from Last 1 Encounters:  08/21/19 _0  (1.803 m)    Weight:   Wt Readings from Last 1 Encounters:  08/26/19 111.6 kg    Ideal Body Weight:  78.2 kg  BMI:  Body mass index is 34.31 kg/m.  Estimated Nutritional Needs:   Kcal:  2000-2200  Protein:  100-115 grams  Fluid:  >/= 1.5 L    Gaynell Face, MS, RD, LDN Inpatient Clinical Dietitian Pager: [phone] Weekend/After Hours: [phone]

## 2019-08-26 NOTE — Evaluation (Addendum)
Clinical/Bedside Swallow Evaluation Patient Details  Name: Nicholas Caldwell MRN: [id number] Date of Birth: 1946/06/29  Today's Date: 08/26/2019 Time: SLP Start Time (ACUTE ONLY): 7510 SLP Stop Time (ACUTE ONLY): 0855 SLP Time Calculation (min) (ACUTE ONLY): 20 min  Past Medical History:  Past Medical History:  Diagnosis Date  . Ex-smoker 11/2017   Past Surgical History: History reviewed. No pertinent surgical history. HPI:  73 y/o M admitted on 10/17 after suffering a PEA arrest (required 12 min CPR before ROSC).  Recent admit from 10/6-10/12 for RLL PNA, CHF decompensation and COPD exacerbation. Pt was initially Intubated less than 24 hours (10/17-10/18) and then he was re-intubated from 10/20-10/22.     Assessment / Plan / Recommendation Clinical Impression  Pt presents with minimal oral dysphagia secondary to edentulism and suspected functional pharyngeal phase of the swallow.  Of note, pt has top and bottom dentures, but he did not wear them during this evaluation. Pt consumed trials of thin liquid, puree, and regular solids without clinical s/sx of aspiration.  Pt exhibited 1 episode of possible oxygen desaturation during thin liquid trials; however, reading may have been inaccurate indicated by poor waveform.  No clinical signs of respiratory distress were observed. RN replaced sensor and saturation immediately increased.  No additional desaturation was observed with thin liquid trials despite challenging.  Pt exhibited prolonged mastication of regular solids secondary to edentulism and prolonged AP transport of thin liquid with occasional swishing prior to swallow initiation.  Recommend continuation of regular solids and thin liquid with medications administered whole in liquid or puree based on pt preference.  Speech Therapy will follow up to monitor diet tolerance.   SLP Visit Diagnosis: Dysphagia, oral phase (R13.11)    Aspiration Risk  Mild aspiration risk    Diet Recommendation  Regular;Thin liquid   Liquid Administration via: Cup;Straw Medication Administration: Whole meds with liquid Supervision: Staff to assist with self feeding Compensations: Slow rate;Small sips/bites Postural Changes: Seated upright at 90 degrees    Other  Recommendations Oral Care Recommendations: Oral care BID   Follow up Recommendations (TBD)      Frequency and Duration min 2x/week  2 weeks       Prognosis Barriers/Prognosis Comment: Good       Swallow Study   General HPI: 74 y/o M admitted on 10/17 after suffering a PEA arrest (required 12 min CPR before ROSC).  Recent admit from 10/6-10/12 for RLL PNA, CHF decompensation and COPD exacerbation. Pt was initially Intubated less than 24 hours (10/17-10/18) and then he was re-intubated from 10/20-10/22.   Type of Study: Bedside Swallow Evaluation Previous Swallow Assessment: BSE on 08/21/19 Diet Prior to this Study: Regular;Thin liquids Temperature Spikes Noted: No Respiratory Status: Nasal cannula History of Recent Intubation: Yes Length of Intubations (days): 2 days Date extubated: 08/25/19 Behavior/Cognition: Alert;Cooperative;Pleasant mood Oral Cavity Assessment: Within Functional Limits Oral Care Completed by SLP: No Oral Cavity - Dentition: Edentulous;Dentures, top;Dentures, bottom Vision: Functional for self-feeding Self-Feeding Abilities: Needs set up Patient Positioning: Upright in bed Baseline Vocal Quality: Normal Volitional Swallow: Able to elicit    Oral/Motor/Sensory Function Overall Oral Motor/Sensory Function: Within functional limits   Ice Chips Ice chips: Not tested   Thin Liquid Thin Liquid: Impaired Presentation: Cup;Straw Oral Phase Functional Implications: Prolonged oral transit    Nectar Thick Nectar Thick Liquid: Not tested   Honey Thick Honey Thick Liquid: Not tested   Puree Puree: Within functional limits Presentation: Spoon   Solid     Solid: Impaired Presentation:  Self Fed Oral Phase  Impairments: Impaired mastication Oral Phase Functional Implications: Prolonged oral transit;Impaired mastication     Bretta Bang, M.S., [place] Acute Rehabilitation Services Office: [phone]  Nicholas Caldwell 08/26/2019,9:17 AM

## 2019-08-26 NOTE — Progress Notes (Signed)
OT Cancellation Note  Patient Details Name: Nicholas Caldwell MRN: [id number] DOB: [birth year]   Cancelled Treatment:    Reason Eval/Treat Not Completed: Other (comment)(transferring units)  [name] 08/26/2019, 11:30AM  Hulda Humphrey OTR/L Acute Rehabilitation Services Pager: [phone] Office: [phone]

## 2019-08-26 NOTE — Progress Notes (Signed)
Patient ID: Nicholas Caldwell, male   DOB: [birth year], 73 y.o.   MRN: [id number]     Advanced Heart Failure Rounding Note  PCP-Cardiologist: No primary care provider on file.   Subjective:    Initially in CCU with PEA arrest likely due to pain meds and noncompliance with Bipap.  Extubated 10/19.   10/21 was transferred to floor, found to be obtunded (had had oxycodone) and unable to protect airway with hypercarbic respiratory failure.  He was intubated again.    Extubated 10/22, now on nasal cannula.   CVP 12 today with co-ox 59%. Good UOP, weight down.  CXR with vascular congestion.   He remains in NSR, now on po amiodarone.     Objective:   Weight Range: 111.6 kg Body mass index is 34.31 kg/m.   Vital Signs:   Temp:  [97.8 F (36.6 C)-98.1 F (36.7 C)] 98 F (36.7 C) (10/23 0800) Pulse Rate:  [26-89] 70 (10/23 0800) Resp:  [13-27] 18 (10/23 0800) BP: (103-149)/(50-97) 141/61 (10/23 0800) SpO2:  [89 %-100 %] 95 % (10/23 0800) FiO2 (%):  [40 %] 40 % (10/22 2006) Weight:  [111.6 kg] 111.6 kg (10/23 0220) Last BM Date: 08/24/19  Weight change: Filed Weights   08/21/19 0210 08/25/19 0455 08/26/19 0220  Weight: 124.2 kg 114.6 kg 111.6 kg    Intake/Output:   Intake/Output Summary (Last 24 hours) at 08/26/2019 0819 Last data filed at 08/26/2019 0600 Gross per 24 hour  Intake 1058.62 ml  Output 3150 ml  Net -2091.38 ml      Physical Exam   CVP 12 General: NAD Neck: thick, JVP difficult but appears elevated, no thyromegaly or thyroid nodule.  Lungs: Decreased BS at bases and occasional wheezes.  CV: Nonpalpable PMI.  Heart regular S1/S2, no S3/S4, no murmur. 1+ ankle edema. Abdomen: Soft, nontender, no hepatosplenomegaly, no distention.  Skin: Intact without lesions or rashes.  Neurologic: Alert and oriented x 3.  Psych: Normal affect. Extremities: No clubbing or cyanosis.  HEENT: Normal.    Telemetry   NSR 70s.  Personally reviewed.   Labs    CBC Recent  Labs    08/25/19 0445 08/26/19 0430  WBC 6.0 7.8  HGB 8.9* 8.9*  HCT 30.2* 31.3*  MCV 88.0 89.2  PLT 334 540   Basic Metabolic Panel Recent Labs    08/24/19 1922 08/25/19 0445 08/25/19 1514 08/26/19 0430  NA  --  149* 152* 149*  K  --  2.6* 2.7* 3.2*  CL  --  99 102 103  CO2  --  36* 35* 35*  GLUCOSE  --  156* 173* 101*  BUN  --  75* 74* 70*  CREATININE  --  2.40* 2.18* 2.05*  CALCIUM  --  9.2 8.8* 9.2  MG  --  2.4  --  2.5*  PHOS 4.0 2.5  --   --    Liver Function Tests No results for input(s): AST, ALT, ALKPHOS, BILITOT, PROT, ALBUMIN in the last 72 hours. No results for input(s): LIPASE, AMYLASE in the last 72 hours. Cardiac Enzymes No results for input(s): CKTOTAL, CKMB, CKMBINDEX, TROPONINI in the last 72 hours.  BNP: BNP (last 3 results) Recent Labs    08/20/19 2210  BNP 4,121.7*    ProBNP (last 3 results) No results for input(s): PROBNP in the last 8760 hours.   D-Dimer No results for input(s): DDIMER in the last 72 hours. Hemoglobin A1C No results for input(s): HGBA1C in the last 72 hours. Fasting  Lipid Panel No results for input(s): CHOL, HDL, LDLCALC, TRIG, CHOLHDL, LDLDIRECT in the last 72 hours. Thyroid Function Tests No results for input(s): TSH, T4TOTAL, T3FREE, THYROIDAB in the last 72 hours.  Invalid input(s): FREET3  Other results:   Imaging    Dg Chest Port 1 View  Result Date: 08/26/2019 CLINICAL DATA:  Interval extubation EXAM: PORTABLE CHEST 1 VIEW COMPARISON:  08/25/2019 FINDINGS: Cardiac shadow is stable. Aortic calcifications are again seen. Right jugular catheter is again noted. Endotracheal tube and gastric catheter have been removed in the interval. Bilateral pleural effusions and patchy bilateral pulmonary opacities are stable. Vascular congestion is seen. No bony abnormality is noted. IMPRESSION: Stable vascular congestion and bilateral effusions. Stable bilateral opacities likely related to edema are seen. Electronically  Signed   By: Inez Catalina M.D.   On: 08/26/2019 07:02     Medications:     Scheduled Medications: . amiodarone  200 mg Per Tube BID  . apixaban  5 mg Per Tube BID  . arformoterol  15 mcg Nebulization BID  . budesonide (PULMICORT) nebulizer solution  0.5 mg Nebulization BID  . chlorhexidine gluconate (MEDLINE KIT)  15 mL Mouth Rinse BID  . Chlorhexidine Gluconate Cloth  6 each Topical Daily  . insulin aspart  0-20 Units Subcutaneous Q4H  . insulin glargine  10 Units Subcutaneous QHS  . ipratropium-albuterol  3 mL Nebulization BID  . mouth rinse  15 mL Mouth Rinse 10 times per day  . potassium chloride  40 mEq Per Tube Q4H  . predniSONE  40 mg Per Tube Q breakfast  . sodium chloride flush  10-40 mL Intracatheter Q12H  . torsemide  100 mg Oral BID    Infusions: . sodium chloride Stopped (08/26/19 0222)  . famotidine (PEPCID) IV Stopped (08/25/19 2130)  . potassium chloride      PRN Medications: sodium chloride, metoprolol tartrate, naLOXone (NARCAN)  injection, sodium chloride flush    Patient Profile   Nicholas Brodenis a 73 y.o.malewith a history of chronic systolic CHFdue to NICM (EF 40-45%)with moderate RV failure, COPD on 3 L O2, DM, HTN, OHS/OSA on CPAP, PAF s/p DCCV, hyperlipidemia, CAD, noncompliance, multiple admissions over the last 6 months due to HF and respiratory failure, readmitted after out of hospital PEA arrest. AHF consulted for management of acute on chronic biventricular failure (predominant RV failure), at the request of Dr. Elsworth Soho, Critical Care Medicine.   Assessment/Plan    1. Acute on chronic hypoxemic/hypercarbic respiratory failure: Baseline COPD and OHS/OSA.  He was not compliant with Bipap at home and was using opiates.  He had a PEA arrest and was intubated.  Got oxycodone on 10/20 while on floor, became obtunded with acute on chronic hypoxemic/hypercarbic respiratory failure and was intubated again. Extubated a 2nd time on 10/22.  This morning,  on nasal cannula.  - Continue diuresis today.  - Limit opioids.  - Continue to strongly encourage Bipap use at night, he is not wearing it much.    1. Chronic systolic => diastolic CHF: With prominent RV failure, likely related to COPD/OSA/OHS. TEE in 3/20 with EF 40-45%, severely dilated RV with moderately decreased systolic function. Echo this admission showed EF up to 60-65% but with severely dilated and dysfunctional RV. He has had multiple recent admissions with CHF and COPD exacerbations. He is chronically NYHA class IIIb. He is on high doses of diuretics at home but has remained volume overloaded as outpatient.  Admitted volume overloaded with intractable RV failure. CVP  12 today, co-ox 59%. Weight and creatinine trending down. - Continue Lasix 120 mg IV bid for 1 more day, will give Diamox 250 mg bid today.  Will transition to po torsemide tomorrow.  -Bidil on hold with soft BP.  2.Atrial fibrillation: Paroxysmal. He has history of CVA. He had DCCV in 3/20 and again in 5/20.  He failed amiodarone in the past and cannot take Tikosyn due to baseline prolonged QT interval.  Went back into atrial fibrillation this admit with RVR. Now in NSR on amiodarone.  - Continue apixaban 5 mg bid.   - Poor ablation candidate with LA size.  - Continue po amiodarone.  3. COPD:No longer smokes. Continue homeoxygen. CT chest showed emphysema. 4.OHS/OSA: Continue 3 L oxygen during the day and CPAP at night at baseline.   - Needs to avoid narcotics.   5.CAD: Nonobstructive disease in 2/20. No chest pain. - Continue crestor 20 mg daily. - He is on apixaban so no ASA.   6. AKI on CKD Stage 3: Creatinine is trending down.  7. Pleural effusion: S/p recent thoracentesis for right pleural effusion. Effusions remain present.  8. Hypokalemia: Replace.    CRITICAL CARE Performed by: Loralie Champagne  Total critical care time: 35 minutes  Critical care time was exclusive of separately billable procedures  and treating other patients.  Critical care was necessary to treat or prevent imminent or life-threatening deterioration.  Critical care was time spent personally by me on the following activities: development of treatment plan with patient and/or surrogate as well as nursing, discussions with consultants, evaluation of patient's response to treatment, examination of patient, obtaining history from patient or surrogate, ordering and performing treatments and interventions, ordering and review of laboratory studies, ordering and review of radiographic studies, pulse oximetry and re-evaluation of patient's condition.  Loralie Champagne 08/26/2019 8:19 AM

## 2019-08-26 NOTE — Progress Notes (Signed)
PROGRESS NOTE  Nicholas Caldwell DSK:[id number] DOB: 18-Jan-1946   PCP: [name], MD  Patient is from: Home  DOA: 08/20/2019 LOS: 6  Brief Narrative / Interim history: 73 year old male with history of COPD/chronic respiratory failure/OSA on 3 L by [place] and noncompliant with home BiPAP, HTN, pAF - on eliquis, combined CHF, chronic pain, ACD and chronic venous insufficiency/stasis dermatitis admitted after cardiac arrest/ROSC after 12 minutes.   He was admitted to ICU post cardiac arrest for acute on chronic respiratory failure due to AECOPD and CHF exacerbation.  He was intubated 10/17-10/18.  Transferred to floor 10/20 but obtunded requiring reintubation after he was given opiates and return to ICU.  Extubated 10/21 and transferred to [facility] service 10/23.   Patient is followed by advanced heart failure team.   Subjective: No major events overnight of this morning.  Wore his BiPAP for part of the night.  Currently on 4 L by [place].  Sitting in bed eating breakfast.  No complaints.  He denies chest pain, dyspnea, palpitation, dizziness, GI or UTI symptoms.  Objective: Vitals:   08/26/19 0800 08/26/19 0900 08/26/19 1000 08/26/19 1100  BP: (!) 141/61 124/77 (!) 122/59 (!) 127/59  Pulse: 70 86 85 78  Resp: _0 Temp: 98 F (36.7 C)     TempSrc: Oral     SpO2: 95% 98% 100% 93%  Weight:      Height:        Intake/Output Summary (Last 24 hours) at 08/26/2019 1409 Last data filed at 08/26/2019 1100 Gross per 24 hour  Intake 610.38 ml  Output 3450 ml  Net -2839.62 ml   Filed Weights   08/21/19 0210 08/25/19 0455 08/26/19 0220  Weight: 124.2 kg 114.6 kg 111.6 kg    Examination:  GENERAL: No acute distress.  Sitting in bed eating breakfast. HEENT: MMM.  Vision and hearing grossly intact.  NECK: Supple.  No apparent JVD but difficult exam. RESP:  No IWOB.  Fair aeration bilaterally. CVS:  RRR. Heart sounds normal.  ABD/GI/GU: Bowel sounds present. Soft. Non tender.  MSK/EXT:   Moves extremities.  Ace wrap over both lower extremities. SKIN: no apparent skin lesion or wound.  Ace wrap over both lower extremities. NEURO: Awake, alert and oriented to self, place, year and person.  No apparent focal neuro deficit. PSYCH: Calm. Normal affect.   Assessment & Plan: Acute on chronic respiratory failure with hypoxia and hypercarbia: Multifactorial-cardiac arrest, AECOPD, OSA, combined CHF, noncompliance with BiPAP and iatrogenic (opiate).  On 3 L and nightly BiPAP at baseline.  COVID-19 negative.  Blood culture with coag negative staph in 1 out of 4 bottles likely contaminant.  Improving. -ETT 10/17-10/18, 10/20-10/21 -Currently on nightly BiPAP, and 4L by [place].  -Treat treatable causes as below. -Avoid opiates and benzos.  Acute on chronic combined CHF/NYHA class IIIb: Echo with normal LVEF (previously 40 to 45%)/severely reduced RVEF, severe RAE.  BNP elevated.  CXR consistent with CHF.  Had -3.5 L urine output in the last 24 hours. -Cardiology managing-on IV Lasix 120 mg twice daily and Diamox 250 mg twice daily -GDMT on hold due to soft blood pressures -Monitor fluid status, creatinine and lites -Salt and fluid restrictions  AECOPD: Improving. -On p.o. prednisone, budesonide, Brovana and DuoNeb. -Wean oxygen as able. -Encourage nightly BiPAP  OSA/OHS on CPAP-noncompliant -Encourage nightly BiPAP  Nonobstructive CAD: Stable.  No anginal symptoms. -Per cardiology.  Cardiac arrest: Successful CPR for 12 minutes.:  10/17-10/18.  Multifactorial given significant cardiopulmonary  comorbidities. -Optimize cardiopulmonary status and electrolytes  Acute toxic metabolic encephalopathy: Likely due to the above and opiates.  CT head without acute finding.  No focal neuro deficit.  Resolved. -Avoid opiates  Paroxysmal A. Fib with RVR: RVR resolved.  S/p DCCV in 3/20 and 5/20 -Continue amiodarone and Eliquis per cardiology  Essential hypertension: Normotensive. -Lasix as  above  AKI on CKD-3 with azotemia: Likely prerenal from cardiac arrest and possible ATN from diuretics.  Improving. -Creatinine 2.9 (admit)>>> 2.05.  BUN 70. -Continue monitoring  Anemia of chronic disease: Unknown baseline.   -Hgb 9.9 (admit)>>> 8.9  -Check anemia panel.  DM-2 with hyperglycemia and renal complication: M1D 6.2% on 10/18.  Not on diabetic medication -Continue Lantus, SSI and CBG monitoring -Resume home statin.  Hypernatremia: contraction alkalosis?.  Improving. -Continue monitoring -Could consider adding Aldactone  Hypokalemia: Likely due to diuretics. -Replenish and recheck  Metabolic alkalosis: Contraction alkalosis? -Continue Diamox as above  Hyperlipidemia -Resume atorvastatin.  Chronic pain: On opioids. -Need serious caution as he ended up on vent on 10/22 after opiates.  Pressure Injury 08/21/19 Sacrum Right Stage II -  Partial thickness loss of dermis presenting as a shallow open ulcer with a red, pink wound bed without slough. Smooth pink (Active)  08/21/19 0215  Location: Sacrum  Location Orientation: Right  Staging: Stage II -  Partial thickness loss of dermis presenting as a shallow open ulcer with a red, pink wound bed without slough.  Wound Description (Comments): Smooth pink  Present on Admission: Yes     Pressure Injury 08/21/19 Buttocks Right Stage II -  Partial thickness loss of dermis presenting as a shallow open ulcer with a red, pink wound bed without slough. (Active)  08/21/19 0215  Location: Buttocks  Location Orientation: Right  Staging: Stage II -  Partial thickness loss of dermis presenting as a shallow open ulcer with a red, pink wound bed without slough.  Wound Description (Comments):   Present on Admission: Yes    Malnutrition Type/Characteristics and interventions:  Nutrition Problem: Inadequate oral intake Etiology: inability to eat  Signs/Symptoms: NPO status  Interventions: Premier Protein   DVT prophylaxis: On  Eliquis for A. fib Code Status: Full code Family Communication: Patient and/or RN. Available if any question.  Disposition Plan: Remains inpatient Consultants: PCCM, cardiology  Procedures:  CPR 10/17 ETT 10/17-10/18, 10/20-10/21  Microbiology summarized: COVID-19 negative MRSA PCR negative Blood culture coag negative staph 1/4 Respiratory culture negative  Sch Meds:  Scheduled Meds: . acetaZOLAMIDE  250 mg Oral BID  . amiodarone  200 mg Oral BID  . apixaban  5 mg Oral BID  . arformoterol  15 mcg Nebulization BID  . budesonide (PULMICORT) nebulizer solution  0.5 mg Nebulization BID  . chlorhexidine  15 mL Mouth Rinse BID  . chlorhexidine gluconate (MEDLINE KIT)  15 mL Mouth Rinse BID  . Chlorhexidine Gluconate Cloth  6 each Topical Daily  . [START ON 08/27/2019] famotidine  20 mg Oral BID  . insulin aspart  0-20 Units Subcutaneous Q4H  . insulin glargine  10 Units Subcutaneous QHS  . ipratropium-albuterol  3 mL Nebulization BID  . mouth rinse  15 mL Mouth Rinse q12n4p  . [START ON 08/27/2019] predniSONE  40 mg Oral Q breakfast  . Ensure Max Protein  11 oz Oral Daily  . sodium chloride flush  10-40 mL Intracatheter Q12H  . [START ON 08/27/2019] torsemide  100 mg Oral BID   Continuous Infusions: . sodium chloride 10 mL/hr at 08/26/19  1100  . furosemide 62 mL/hr at 08/26/19 1100   PRN Meds:.sodium chloride, metoprolol tartrate, naLOXone (NARCAN)  injection, sodium chloride flush  Antimicrobials: Anti-infectives (From admission, onward)   Start     Dose/Rate Route Frequency Ordered Stop   08/21/19 0800  piperacillin-tazobactam (ZOSYN) IVPB 3.375 g  Status:  Discontinued     3.375 g 12.5 mL/hr over 240 Minutes Intravenous Every 8 hours 08/21/19 0143 08/25/19 0919   08/21/19 0145  vancomycin (VANCOCIN) 1,500 mg in sodium chloride 0.9 % 500 mL IVPB  Status:  Discontinued     1,500 mg 250 mL/hr over 120 Minutes Intravenous Every 48 hours 08/21/19 0134 08/21/19 0824    08/20/19 2345  piperacillin-tazobactam (ZOSYN) IVPB 3.375 g     3.375 g 100 mL/hr over 30 Minutes Intravenous  Once 08/20/19 2340 08/21/19 0043   08/20/19 2345  vancomycin (VANCOCIN) IVPB 1000 mg/200 mL premix     1,000 mg 200 mL/hr over 60 Minutes Intravenous  Once 08/20/19 2341 08/21/19 0117       I have personally reviewed the following labs and images: CBC: Recent Labs  Lab 08/22/19 0231 08/23/19 0454 08/23/19 2121 08/24/19 0449 08/24/19 1159 08/25/19 0445 08/26/19 0430  WBC 7.3 7.2  --  6.8  --  6.0 7.8  HGB 8.6* 9.0* 11.2* 8.8* 10.2* 8.9* 8.9*  HCT 28.3* 30.5* 33.0* 30.9* 30.0* 30.2* 31.3*  MCV 86.5 90.2  --  91.2  --  88.0 89.2  PLT 338 349  --  322  --  334 328   BMP &GFR Recent Labs  Lab 08/21/19 0158  08/22/19 0934 08/23/19 0454  08/24/19 0449 08/24/19 1159 08/24/19 1536 08/24/19 1922 08/25/19 0445 08/25/19 1514 08/26/19 0430  NA 136   < >  --  145   < > 146* 145 149*  --  149* 152* 149*  K 4.5   < >  --  3.3*   < > 2.8* 3.2* 3.4*  --  2.6* 2.7* 3.2*  CL 89*   < >  --  93*  --  95*  --  97*  --  99 102 103  CO2 32   < >  --  39*  --  37*  --  36*  --  36* 35* 35*  GLUCOSE 198*   < >  --  106*  --  111*  --  169*  --  156* 173* 101*  BUN 67*   < >  --  70*  --  65*  --  64*  --  75* 74* 70*  CREATININE 2.53*   < >  --  2.60*  --  2.37*  --  2.44*  --  2.40* 2.18* 2.05*  CALCIUM 8.4*   < >  --  8.8*  --  9.2  --  9.0  --  9.2 8.8* 9.2  MG 2.4  --  2.4 2.3  --  2.4  --   --   --  2.4  --  2.5*  PHOS 5.2*  --   --  4.5  --   --   --   --  4.0 2.5  --   --    < > = values in this interval not displayed.   Estimated Creatinine Clearance: 42 mL/min (A) (by C-G formula based on SCr of 2.05 mg/dL (H)). Liver & Pancreas: Recent Labs  Lab 08/20/19 2210  AST 26  ALT 31  ALKPHOS 172*  BILITOT 0.6  PROT  6.3*  ALBUMIN 3.1*   Recent Labs  Lab 08/20/19 2210  LIPASE 22   No results for input(s): AMMONIA in the last 168 hours. Diabetic: No results for  input(s): HGBA1C in the last 72 hours. Recent Labs  Lab 08/25/19 2025 08/25/19 2346 08/26/19 0339 08/26/19 0740 08/26/19 1107  GLUCAP 139* 158* 108* 96 303*   Cardiac Enzymes: No results for input(s): CKTOTAL, CKMB, CKMBINDEX, TROPONINI in the last 168 hours. No results for input(s): PROBNP in the last 8760 hours. Coagulation Profile: Recent Labs  Lab 08/21/19 0017 08/21/19 0602 08/21/19 1201  INR 1.4* 1.5* 1.6*   Thyroid Function Tests: No results for input(s): TSH, T4TOTAL, FREET4, T3FREE, THYROIDAB in the last 72 hours. Lipid Profile: No results for input(s): CHOL, HDL, LDLCALC, TRIG, CHOLHDL, LDLDIRECT in the last 72 hours. Anemia Panel: No results for input(s): VITAMINB12, FOLATE, FERRITIN, TIBC, IRON, RETICCTPCT in the last 72 hours. Urine analysis: No results found for: COLORURINE, APPEARANCEUR, LABSPEC, PHURINE, GLUCOSEU, HGBUR, BILIRUBINUR, KETONESUR, PROTEINUR, UROBILINOGEN, NITRITE, LEUKOCYTESUR Sepsis Labs: Invalid input(s): PROCALCITONIN, [place]  Microbiology: Recent Results (from the past 240 hour(s))  SARS Coronavirus 2 by RT PCR (hospital order, performed in [facility] hospital lab) Nasopharyngeal Nasopharyngeal Swab     Status: None   Collection Time: 08/20/19 10:15 PM   Specimen: Nasopharyngeal Swab  Result Value Ref Range Status   SARS Coronavirus 2 NEGATIVE NEGATIVE Final    Comment: (NOTE) If result is NEGATIVE SARS-CoV-2 target nucleic acids are NOT DETECTED. The SARS-CoV-2 RNA is generally detectable in upper and lower  respiratory specimens during the acute phase of infection. The lowest  concentration of SARS-CoV-2 viral copies this assay can detect is 250  copies / mL. A negative result does not preclude SARS-CoV-2 infection  and should not be used as the sole basis for treatment or other  patient management decisions.  A negative result may occur with  improper specimen collection / handling, submission of specimen other  than  nasopharyngeal swab, presence of viral mutation(s) within the  areas targeted by this assay, and inadequate number of viral copies  (<250 copies / mL). A negative result must be combined with clinical  observations, patient history, and epidemiological information. If result is POSITIVE SARS-CoV-2 target nucleic acids are DETECTED. The SARS-CoV-2 RNA is generally detectable in upper and lower  respiratory specimens dur ing the acute phase of infection.  Positive  results are indicative of active infection with SARS-CoV-2.  Clinical  correlation with patient history and other diagnostic information is  necessary to determine patient infection status.  Positive results do  not rule out bacterial infection or co-infection with other viruses. If result is PRESUMPTIVE POSTIVE SARS-CoV-2 nucleic acids MAY BE PRESENT.   A presumptive positive result was obtained on the submitted specimen  and confirmed on repeat testing.  While 2019 novel coronavirus  (SARS-CoV-2) nucleic acids may be present in the submitted sample  additional confirmatory testing may be necessary for epidemiological  and / or clinical management purposes  to differentiate between  SARS-CoV-2 and other Sarbecovirus currently known to infect humans.  If clinically indicated additional testing with an alternate test  methodology [phone]) is advised. The SARS-CoV-2 RNA is generally  detectable in upper and lower respiratory sp ecimens during the acute  phase of infection. The expected result is Negative. Fact Sheet for Patients:  StrictlyIdeas.no Fact Sheet for Healthcare Providers: BankingDealers.co.za This test is not yet approved or cleared by the [place] FDA and has been authorized for  detection and/or diagnosis of SARS-CoV-2 by FDA under an Emergency Use Authorization (EUA).  This EUA will remain in effect (meaning this test can be used) for the duration of the  COVID-19 declaration under Section 564(b)(1) of the Act, 21 U.S.C. section 360bbb-3(b)(1), unless the authorization is terminated or revoked sooner. Performed at [facility], [place] [address] [place], [place] [address]   Blood culture (routine x 2)     Status: Abnormal   Collection Time: 08/20/19 10:35 PM   Specimen: BLOOD RIGHT HAND  Result Value Ref Range Status   Specimen Description BLOOD RIGHT HAND  Final   Special Requests   Final    BOTTLES DRAWN AEROBIC AND ANAEROBIC Blood Culture results may not be optimal due to an inadequate volume of blood received in culture bottles   Culture  Setup Time   Final    AEROBIC BOTTLE ONLY GRAM POSITIVE COCCI CRITICAL RESULT CALLED TO, READ BACK BY AND VERIFIED WITH: LAURA FAYE @ 0500 on 08/22/19 by robinson z.     Culture (A)  Final    STAPHYLOCOCCUS SPECIES (COAGULASE NEGATIVE) THE SIGNIFICANCE OF ISOLATING THIS ORGANISM FROM A SINGLE SET OF BLOOD CULTURES WHEN MULTIPLE SETS ARE DRAWN IS UNCERTAIN. PLEASE NOTIFY THE MICROBIOLOGY DEPARTMENT WITHIN ONE WEEK IF SPECIATION AND SENSITIVITIES ARE REQUIRED. Performed at [facility], [place] [address] [place], [place] [address]    Report Status 08/23/2019 FINAL  Final  Blood culture (routine x 2)     Status: None   Collection Time: 08/20/19 10:43 PM   Specimen: BLOOD LEFT HAND  Result Value Ref Range Status   Specimen Description BLOOD LEFT HAND  Final   Special Requests   Final    BOTTLES DRAWN AEROBIC ONLY Blood Culture adequate volume   Culture   Final    NO GROWTH 5 DAYS Performed at [facility], [place] [address] [place], [place] [address]    Report Status 08/25/2019 FINAL  Final  MRSA PCR Screening     Status: None   Collection Time: 08/21/19  1:57 AM   Specimen: Nasal Mucosa; Nasopharyngeal  Result Value Ref Range Status   MRSA by PCR NEGATIVE NEGATIVE Final    Comment:        The GeneXpert MRSA Assay (FDA approved for NASAL specimens only), is one component of a  comprehensive MRSA colonization surveillance program. It is not intended to diagnose MRSA infection nor to guide or monitor treatment for MRSA infections. Performed at [facility], [place] [address] [place], [place] [address]   Culture, respiratory (non-expectorated)     Status: None (Preliminary result)   Collection Time: 08/25/19  9:52 AM   Specimen: Tracheal Aspirate; Respiratory  Result Value Ref Range Status   Specimen Description TRACHEAL ASPIRATE  Final   Special Requests NONE  Final   Gram Stain NO WBC SEEN NO ORGANISMS SEEN   Final   Culture   Final    NO GROWTH 1 DAY Performed at [facility], [place] [address], [place], [place] [address]    Report Status PENDING  Incomplete    Radiology Studies: Dg Chest Port 1 View  Result Date: 08/26/2019 CLINICAL DATA:  Interval extubation EXAM: PORTABLE CHEST 1 VIEW COMPARISON:  08/25/2019 FINDINGS: Cardiac shadow is stable. Aortic calcifications are again seen. Right jugular catheter is again noted. Endotracheal tube and gastric catheter have been removed in the interval. Bilateral pleural effusions and patchy bilateral pulmonary opacities are stable. Vascular  congestion is seen. No bony abnormality is noted. IMPRESSION: Stable vascular congestion and bilateral effusions. Stable bilateral opacities likely related to edema are seen. Electronically Signed   By: Inez Catalina M.D.   On: 08/26/2019 07:02     45 minutes with more than 50% spent in reviewing records, counseling patient and coordinating care.  Fabyan Loughmiller T. [place]  If 7PM-7AM, please contact night-coverage [email or web address] Password [email or web address] 08/26/2019, 2:09 PM

## 2019-08-26 NOTE — Progress Notes (Signed)
pccm signed off - d/w Dr Lester Trego    Dr. Brand Males, M.D., F.C.C.P,  Pulmonary and Critical Care Medicine Staff Physician, [facility] Director - Interstitial Lung Disease  Program  Pulmonary [place] [facility] [place], [place], [address]  Pager: [phone], If no answer or between  15:00h - 7:00h: call [phone] Telephone: [phone]  10:29 AM 08/26/2019

## 2019-08-27 ENCOUNTER — Inpatient Hospital Stay (HOSPITAL_COMMUNITY): Payer: Medicare Other

## 2019-08-27 DIAGNOSIS — D638 Anemia in other chronic diseases classified elsewhere: Secondary | ICD-10-CM | POA: Diagnosis not present

## 2019-08-27 DIAGNOSIS — E876 Hypokalemia: Secondary | ICD-10-CM | POA: Diagnosis not present

## 2019-08-27 DIAGNOSIS — R7989 Other specified abnormal findings of blood chemistry: Secondary | ICD-10-CM | POA: Diagnosis not present

## 2019-08-27 DIAGNOSIS — I469 Cardiac arrest, cause unspecified: Secondary | ICD-10-CM | POA: Diagnosis not present

## 2019-08-27 DIAGNOSIS — J9602 Acute respiratory failure with hypercapnia: Secondary | ICD-10-CM | POA: Diagnosis not present

## 2019-08-27 LAB — CBC
HCT: 28.2 % — ABNORMAL LOW (ref 39.0–52.0)
Hemoglobin: 8.2 g/dL — ABNORMAL LOW (ref 13.0–17.0)
MCH: 25.7 pg — ABNORMAL LOW (ref 26.0–34.0)
MCHC: 29.1 g/dL — ABNORMAL LOW (ref 30.0–36.0)
MCV: 88.4 fL (ref 80.0–100.0)
Platelets: 292 10*3/uL (ref 150–400)
RBC: 3.19 MIL/uL — ABNORMAL LOW (ref 4.22–5.81)
RDW: 20.5 % — ABNORMAL HIGH (ref 11.5–15.5)
WBC: 7.6 10*3/uL (ref 4.0–10.5)
nRBC: 0.8 % — ABNORMAL HIGH (ref 0.0–0.2)

## 2019-08-27 LAB — COOXEMETRY PANEL
Carboxyhemoglobin: 2.1 % — ABNORMAL HIGH (ref 0.5–1.5)
Methemoglobin: 1.1 % (ref 0.0–1.5)
O2 Saturation: 85.1 %
Total hemoglobin: 8.5 g/dL — ABNORMAL LOW (ref 12.0–16.0)

## 2019-08-27 LAB — BASIC METABOLIC PANEL
Anion gap: 13 (ref 5–15)
BUN: 65 mg/dL — ABNORMAL HIGH (ref 8–23)
CO2: 32 mmol/L (ref 22–32)
Calcium: 8.8 mg/dL — ABNORMAL LOW (ref 8.9–10.3)
Chloride: 100 mmol/L (ref 98–111)
Creatinine, Ser: 2.08 mg/dL — ABNORMAL HIGH (ref 0.61–1.24)
GFR calc Af Amer: 36 mL/min — ABNORMAL LOW (ref >60)
GFR calc non Af Amer: 31 mL/min — ABNORMAL LOW (ref >60)
Glucose, Bld: 96 mg/dL (ref 70–99)
Potassium: 3.4 mmol/L — ABNORMAL LOW (ref 3.5–5.1)
Sodium: 145 mmol/L (ref 135–145)

## 2019-08-27 LAB — RETICULOCYTES
Immature Retic Fract: 14.1 % (ref 2.3–15.9)
RBC.: 3.35 MIL/uL — ABNORMAL LOW (ref 4.22–5.81)
Retic Count, Absolute: 66.3 10*3/uL (ref 19.0–186.0)
Retic Ct Pct: 2 % (ref 0.4–3.1)

## 2019-08-27 LAB — GLUCOSE, CAPILLARY
Glucose-Capillary: 147 mg/dL — ABNORMAL HIGH (ref 70–99)
Glucose-Capillary: 168 mg/dL — ABNORMAL HIGH (ref 70–99)
Glucose-Capillary: 227 mg/dL — ABNORMAL HIGH (ref 70–99)
Glucose-Capillary: 269 mg/dL — ABNORMAL HIGH (ref 70–99)
Glucose-Capillary: 271 mg/dL — ABNORMAL HIGH (ref 70–99)
Glucose-Capillary: 74 mg/dL (ref 70–99)

## 2019-08-27 LAB — IRON AND TIBC
Iron: 85 ug/dL (ref 45–182)
Saturation Ratios: 23 % (ref 17.9–39.5)
TIBC: 363 ug/dL (ref 250–450)
UIBC: 278 ug/dL

## 2019-08-27 LAB — FERRITIN: Ferritin: 35 ng/mL (ref 24–336)

## 2019-08-27 LAB — MAGNESIUM: Magnesium: 2.3 mg/dL (ref 1.7–2.4)

## 2019-08-27 LAB — VITAMIN B12: Vitamin B-12: 396 pg/mL (ref 180–914)

## 2019-08-27 LAB — FOLATE: Folate: 7 ng/mL (ref >5.9)

## 2019-08-27 MED ORDER — POLYETHYLENE GLYCOL 3350 17 G PO PACK
17.0000 g | PACK | Freq: Every day | ORAL | Status: DC
  Administered 2019-08-27 – 2019-08-28 (×2): 17 g via ORAL
  Filled 2019-08-27 (×3): qty 1

## 2019-08-27 MED ORDER — SENNOSIDES-DOCUSATE SODIUM 8.6-50 MG PO TABS
1.0000 | ORAL_TABLET | Freq: Two times a day (BID) | ORAL | Status: DC | PRN
  Administered 2019-08-27: 1 via ORAL
  Filled 2019-08-27: qty 1

## 2019-08-27 MED ORDER — POTASSIUM CHLORIDE CRYS ER 20 MEQ PO TBCR
40.0000 meq | EXTENDED_RELEASE_TABLET | ORAL | Status: AC
  Administered 2019-08-27 (×2): 40 meq via ORAL
  Filled 2019-08-27 (×2): qty 2

## 2019-08-27 NOTE — Progress Notes (Signed)
Patient ID: Nicholas Caldwell, male   DOB: 1945-12-15, 73 y.o.   MRN: [id number]     Advanced Heart Failure Rounding Note  PCP-Cardiologist: No primary care provider on file.   Subjective:    Initially in CCU with PEA arrest likely due to pain meds and noncompliance with Bipap.  Extubated 10/19.   10/21 was transferred to floor, found to be obtunded (had had oxycodone) and unable to protect airway with hypercarbic respiratory failure.  He was intubated again.    Extubated 10/22, now on nasal cannula.   CVP 10 today with co-ox 85%. Good UOP with over 4L out. Weight recorded as up 4 pounds however. (doubt accurate)  He remains in NSR, now on po amiodarone.    Denies SOB, orthopnea or PND   Objective:   Weight Range: 113.6 kg Body mass index is 34.93 kg/m.   Vital Signs:   Temp:  [98.7 F (37.1 C)] 98.7 F (37.1 C) (10/24 0500) Pulse Rate:  [67-70] 67 (10/24 0500) Resp:  [20-22] 22 (10/24 0500) BP: (102-116)/(58-62) 102/62 (10/24 0815) SpO2:  [95 %-100 %] 96 % (10/24 0500) Weight:  [113.6 kg] 113.6 kg (10/24 0500) Last BM Date: 08/26/19  Weight change: Filed Weights   08/25/19 0455 08/26/19 0220 08/27/19 0500  Weight: 114.6 kg 111.6 kg 113.6 kg    Intake/Output:   Intake/Output Summary (Last 24 hours) at 08/27/2019 1431 Last data filed at 08/27/2019 0800 Gross per 24 hour  Intake 738.87 ml  Output 3200 ml  Net -2461.13 ml      Physical Exam    CVP 10 General:  Lying in bed. No resp difficulty HEENT: normal Neck: supple. RIJ TLC Carotids 2+ bilat; no bruits. No lymphadenopathy or thryomegaly appreciated. Cor: PMI nondisplaced. Regular rate & rhythm. 2/6 TR Lungs: clear Abdomen: obese soft, nontender, nondistended. No hepatosplenomegaly. No bruits or masses. Good bowel sounds. Extremities: no cyanosis, clubbing, rash, trace edema Neuro: alert & orientedx3, cranial nerves grossly intact. moves all 4 extremities w/o difficulty. Affect pleasant    Telemetry    NSR 60-70s.  Personally reviewed.   Labs    CBC Recent Labs    08/26/19 0430 08/27/19 0614  WBC 7.8 7.6  HGB 8.9* 8.2*  HCT 31.3* 28.2*  MCV 89.2 88.4  PLT 328 784   Basic Metabolic Panel Recent Labs    08/24/19 1922  08/25/19 0445  08/26/19 0430 08/27/19 0614  NA  --   --  149*   < > 149* 145  K  --   --  2.6*   < > 3.2* 3.4*  CL  --   --  99   < > 103 100  CO2  --   --  36*   < > 35* 32  GLUCOSE  --   --  156*   < > 101* 96  BUN  --   --  75*   < > 70* 65*  CREATININE  --   --  2.40*   < > 2.05* 2.08*  CALCIUM  --   --  9.2   < > 9.2 8.8*  MG  --    < > 2.4  --  2.5* 2.3  PHOS 4.0  --  2.5  --   --   --    < > = values in this interval not displayed.   Liver Function Tests No results for input(s): AST, ALT, ALKPHOS, BILITOT, PROT, ALBUMIN in the last 72 hours. No results for input(s):  LIPASE, AMYLASE in the last 72 hours. Cardiac Enzymes No results for input(s): CKTOTAL, CKMB, CKMBINDEX, TROPONINI in the last 72 hours.  BNP: BNP (last 3 results) Recent Labs    08/20/19 2210  BNP 4,121.7*    ProBNP (last 3 results) No results for input(s): PROBNP in the last 8760 hours.   D-Dimer No results for input(s): DDIMER in the last 72 hours. Hemoglobin A1C No results for input(s): HGBA1C in the last 72 hours. Fasting Lipid Panel No results for input(s): CHOL, HDL, LDLCALC, TRIG, CHOLHDL, LDLDIRECT in the last 72 hours. Thyroid Function Tests No results for input(s): TSH, T4TOTAL, T3FREE, THYROIDAB in the last 72 hours.  Invalid input(s): FREET3  Other results:   Imaging    Dg Abd Portable 1v  Result Date: 08/27/2019 CLINICAL DATA:  Abdominal pain. EXAM: PORTABLE ABDOMEN - 1 VIEW COMPARISON:  CT abdomen pelvis dated January 02, 2019. Abdominal x-ray dated September 06, 2016. FINDINGS: The visualized bowel gas pattern is normal. No radio-opaque calculi or other significant radiographic abnormality are seen. No acute osseous abnormality. Prior left total hip  arthroplasty. IMPRESSION: Negative. Electronically Signed   By: Titus Dubin M.D.   On: 08/27/2019 13:04     Medications:     Scheduled Medications: . amiodarone  200 mg Oral BID  . apixaban  5 mg Oral BID  . arformoterol  15 mcg Nebulization BID  . budesonide (PULMICORT) nebulizer solution  0.5 mg Nebulization BID  . chlorhexidine  15 mL Mouth Rinse BID  . chlorhexidine gluconate (MEDLINE KIT)  15 mL Mouth Rinse BID  . Chlorhexidine Gluconate Cloth  6 each Topical Daily  . famotidine  20 mg Oral BID  . insulin aspart  0-20 Units Subcutaneous Q4H  . insulin glargine  10 Units Subcutaneous QHS  . ipratropium-albuterol  3 mL Nebulization BID  . mouth rinse  15 mL Mouth Rinse q12n4p  . polyethylene glycol  17 g Oral Daily  . predniSONE  40 mg Oral Q breakfast  . Ensure Max Protein  11 oz Oral Daily  . rosuvastatin  20 mg Oral QHS  . sodium chloride flush  10-40 mL Intracatheter Q12H  . torsemide  100 mg Oral BID    Infusions: . sodium chloride 10 mL/hr at 08/26/19 1100    PRN Medications: sodium chloride, metoprolol tartrate, naLOXone (NARCAN)  injection, senna-docusate, sodium chloride flush    Patient Profile   Nicholas Caldwell a 73 y.o.malewith a history of chronic systolic CHFdue to NICM (EF 40-45%)with moderate RV failure, COPD on 3 L O2, DM, HTN, OHS/OSA on CPAP, PAF s/p DCCV, hyperlipidemia, CAD, noncompliance, multiple admissions over the last 6 months due to HF and respiratory failure, readmitted after out of hospital PEA arrest. AHF consulted for management of acute on chronic biventricular failure (predominant RV failure), at the request of Dr. Elsworth Soho, Critical Care Medicine.   Assessment/Plan    1. Acute on chronic hypoxemic/hypercarbic respiratory failure: Baseline COPD and OHS/OSA.  He was not compliant with Bipap at home and was using opiates.  He had a PEA arrest and was intubated.  Got oxycodone on 10/20 while on floor, became obtunded with acute on  chronic hypoxemic/hypercarbic respiratory failure and was intubated again. Extubated a 2nd time on 10/22.  This morning, on nasal cannula.  - Much improved - Limit opioids.  - Continue to strongly encourage Bipap use at night, he is not wearing it much.    2. Chronic systolic => diastolic CHF: With prominent RV failure,  likely related to COPD/OSA/OHS. TEE in 3/20 with EF 40-45%, severely dilated RV with moderately decreased systolic function. Echo this admission showed EF up to 60-65% but with severely dilated and dysfunctional RV. He has had multiple recent admissions with CHF and COPD exacerbations. He is chronically NYHA class IIIb. He is on high doses of diuretics at home but has remained volume overloaded as outpatient.  Admitted volume overloaded with intractable RV failure. CVP 10 today, co-ox 85%. - Suspect this is about as good as we can get his volume status in setting of RV failure. Creatinine stable.  - Switch to po torsemide 100 bid  -Bidil on hold with soft BP.  2.Atrial fibrillation: Paroxysmal. He has history of CVA. He had DCCV in 3/20 and again in 5/20.  He failed amiodarone in the past and cannot take Tikosyn due to baseline prolonged QT interval.  Went back into atrial fibrillation this admit with RVR. Now in NSR on amiodarone.  - Remains in NSR - Continue apixaban 5 mg bid   - Poor ablation candidate with LA size.  - Continue po amiodarone.  3. COPD:No longer smokes. Continue homeoxygen. CT chest showed emphysema. 4.OHS/OSA: Continue 3 L oxygen during the day and CPAP at night at baseline.   - Needs to avoid narcotics.   5.CAD: Nonobstructive disease in 2/20. No s/s ischemia - Continue crestor 20 mg daily. - He is on apixaban so no ASA.   6. AKI on CKD Stage 3: Creatinine is trending down.  7. Pleural effusion: S/p recent thoracentesis for right pleural effusion. Effusions remain present.  8. Hypokalemia: Replace.     Glori Bickers MD 08/27/2019 2:31 PM

## 2019-08-27 NOTE — Progress Notes (Signed)
PROGRESS NOTE  Nicholas Caldwell CHE:[id number] DOB: [birth year]   PCP: [name], MD  Patient is from: Home  DOA: 08/20/2019 LOS: 7  Brief Narrative / Interim history: 73 year old male with history of COPD/chronic respiratory failure/OSA on 3 L by [place] and noncompliant with home BiPAP, HTN, pAF - on eliquis, combined CHF, chronic pain, ACD and chronic venous insufficiency/stasis dermatitis admitted after cardiac arrest/ROSC after 12 minutes.   He was admitted to ICU post cardiac arrest for acute on chronic respiratory failure due to AECOPD and CHF exacerbation.  He was intubated 10/17-10/18.  Transferred to floor 10/20 but obtunded requiring reintubation after he was given opiates and return to ICU.  Extubated 10/21 and transferred to [facility] service 10/23.   Patient is followed by advanced heart failure team.   Subjective: No major events overnight of this morning.  Spend the night on BiPAP.  Currently on [place].  Complains of abdominal pain that he describes as throbbing.  He is not a great historian.  He denies chest pain or dyspnea.  He says he had a bowel movement yesterday.   Objective: Vitals:   08/26/19 2247 08/27/19 0415 08/27/19 0500 08/27/19 0815  BP:   (!) 116/58 102/62  Pulse: 70 70 67   Resp: 20 20 (!) 22   Temp:   98.7 F (37.1 C)   TempSrc:   Oral   SpO2: 100% 100% 96%   Weight:   113.6 kg   Height:        Intake/Output Summary (Last 24 hours) at 08/27/2019 1339 Last data filed at 08/27/2019 0800 Gross per 24 hour  Intake 738.87 ml  Output 3200 ml  Net -2461.13 ml   Filed Weights   08/25/19 0455 08/26/19 0220 08/27/19 0500  Weight: 114.6 kg 111.6 kg 113.6 kg    Examination:  GENERAL: No acute distress.  Appears well.  HEENT: MMM.  Vision and hearing grossly intact.  NECK: Supple.  No apparent JVD but difficult exam.  RESP:  No IWOB.  Fair aeration bilaterally. CVS:  RRR. Heart sounds normal.  ABD/GI/GU: Bowel sounds present. Soft.  Diffuse tenderness with no  rebound or guarding. MSK/EXT:  Moves extremities.  Ace wrap over both lower extremities. SKIN: Ace wrap over both lower extremities. NEURO: Awake, alert and oriented x4.  No apparent focal neuro deficit. PSYCH: Calm. Normal affect.   Assessment & Plan: Acute on chronic respiratory failure with hypoxia and hypercarbia: Multifactorial-cardiac arrest, AECOPD, OSA, combined CHF, noncompliance with BiPAP and iatrogenic (opiate).  On 3 L and nightly BiPAP at baseline.  COVID-19 negative.  Blood culture with coag negative staph in 1 out of 4 bottles likely contaminant.  Improving. -ETT 10/17-10/18, 10/20-10/21 -Currently on nightly BiPAP, and 3L by [place].  -Treat treatable causes as below. -Avoid opiates and benzos.  Acute on chronic combined CHF/NYHA class IIIb: Echo with normal LVEF (previously 40 to 45%)/severely reduced RVEF, severe RAE.  BNP elevated.  CXR consistent with CHF.  Had -4 L urine output in the last 24 hours with high-dose IV Lasix and Diamox.  Renal function is stable.  Hypercarbia resolved. -Cardiology managing-now on torsemide 100 mg twice daily. -GDMT on hold due to soft blood pressures -Monitor fluid status, creatinine and lites -Salt and fluid restrictions  AECOPD: Improving. -Solu-Medrol 10/18> prednisone 10/21> -Budesonide, Brovana and DuoNeb. -Wean oxygen as able. -Encourage nightly BiPAP  OSA/OHS on CPAP-noncompliant -Encourage nightly BiPAP  Nonobstructive CAD: Stable.  No anginal symptoms. -Per cardiology.  Cardiac arrest: Successful CPR for 12  minutes.:  10/17-10/18.  Multifactorial given significant cardiopulmonary comorbidities. -Optimize cardiopulmonary status and electrolytes  Acute toxic metabolic encephalopathy: Multifactorial-respiratory failure, azotemia and opiates.  CT head without acute finding.  No focal neuro deficit.  Resolved. -Avoid opiates  Paroxysmal A. Fib with RVR: RVR resolved.  S/p DCCV in 3/20 and 5/20 -Continue amiodarone and Eliquis  per cardiology  Essential hypertension: Normotensive. -Diuretics as above.  AKI on CKD-3 with azotemia: Likely prerenal from cardiac arrest and possible ATN from diuretics.  Improving. -Creatinine 2.9 (admit)>>> 2.05.  BUN 65. -Continue monitoring  Anemia of chronic disease: Unknown baseline.   -Hgb 9.9 (admit)>>> 8.9>8.2 -Check anemia panel and FOBT.  DM-2 with hyperglycemia and renal complication: G2E 3.6% on 10/18.  Not on diabetic medication -Continue Lantus, SSI and CBG monitoring -Resume home statin.  Hypernatremia: contraction alkalosis.  Resolved.   -Continue monitoring -Could consider adding Aldactone  Hypokalemia: Likely due to diuretics. -Replenish and recheck -Magnesium within normal.  Metabolic alkalosis: Contraction alkalosis?  Resolved. -Diamox discontinued.  Hyperlipidemia -Resume atorvastatin.  Chronic pain: On opioids. -Need serious caution as he ended up on vent on 10/22 after opiates.  Abdominal pain: Exam benign.  KUB not impressive. -Continue Pepcid -Bowel regimen  Pressure Injury 08/21/19 Sacrum Right Stage II -  Partial thickness loss of dermis presenting as a shallow open ulcer with a red, pink wound bed without slough. Smooth pink (Active)  08/21/19 0215  Location: Sacrum  Location Orientation: Right  Staging: Stage II -  Partial thickness loss of dermis presenting as a shallow open ulcer with a red, pink wound bed without slough.  Wound Description (Comments): Smooth pink  Present on Admission: Yes     Pressure Injury 08/21/19 Buttocks Right Stage II -  Partial thickness loss of dermis presenting as a shallow open ulcer with a red, pink wound bed without slough. (Active)  08/21/19 0215  Location: Buttocks  Location Orientation: Right  Staging: Stage II -  Partial thickness loss of dermis presenting as a shallow open ulcer with a red, pink wound bed without slough.  Wound Description (Comments):   Present on Admission: Yes     Malnutrition Type/Characteristics and interventions:  Nutrition Problem: Inadequate oral intake Etiology: inability to eat  Signs/Symptoms: NPO status  Interventions: Premier Protein   DVT prophylaxis: On Eliquis for A. fib Code Status: Full code Family Communication: Patient and/or RN. Available if any question.  Disposition Plan: Remains inpatient pending clearance by cardiology.  Final disposition likely SNF if agreeable. Consultants: PCCM, cardiology  Procedures:  CPR 10/17 ETT 10/17-10/18, 10/20-10/21  Microbiology summarized: COVID-19 negative MRSA PCR negative Blood culture coag negative staph 1/4 Respiratory culture negative  Sch Meds:  Scheduled Meds:  amiodarone  200 mg Oral BID   apixaban  5 mg Oral BID   arformoterol  15 mcg Nebulization BID   budesonide (PULMICORT) nebulizer solution  0.5 mg Nebulization BID   chlorhexidine  15 mL Mouth Rinse BID   chlorhexidine gluconate (MEDLINE KIT)  15 mL Mouth Rinse BID   Chlorhexidine Gluconate Cloth  6 each Topical Daily   famotidine  20 mg Oral BID   insulin aspart  0-20 Units Subcutaneous Q4H   insulin glargine  10 Units Subcutaneous QHS   ipratropium-albuterol  3 mL Nebulization BID   mouth rinse  15 mL Mouth Rinse q12n4p   polyethylene glycol  17 g Oral Daily   predniSONE  40 mg Oral Q breakfast   Ensure Max Protein  11 oz Oral Daily  rosuvastatin  20 mg Oral QHS   sodium chloride flush  10-40 mL Intracatheter Q12H   torsemide  100 mg Oral BID   Continuous Infusions:  sodium chloride 10 mL/hr at 08/26/19 1100   PRN Meds:.sodium chloride, metoprolol tartrate, naLOXone (NARCAN)  injection, senna-docusate, sodium chloride flush  Antimicrobials: Anti-infectives (From admission, onward)   Start     Dose/Rate Route Frequency Ordered Stop   08/21/19 0800  piperacillin-tazobactam (ZOSYN) IVPB 3.375 g  Status:  Discontinued     3.375 g 12.5 mL/hr over 240 Minutes Intravenous Every 8  hours 08/21/19 0143 08/25/19 0919   08/21/19 0145  vancomycin (VANCOCIN) 1,500 mg in sodium chloride 0.9 % 500 mL IVPB  Status:  Discontinued     1,500 mg 250 mL/hr over 120 Minutes Intravenous Every 48 hours 08/21/19 0134 08/21/19 0824   08/20/19 2345  piperacillin-tazobactam (ZOSYN) IVPB 3.375 g     3.375 g 100 mL/hr over 30 Minutes Intravenous  Once 08/20/19 2340 08/21/19 0043   08/20/19 2345  vancomycin (VANCOCIN) IVPB 1000 mg/200 mL premix     1,000 mg 200 mL/hr over 60 Minutes Intravenous  Once 08/20/19 2341 08/21/19 0117       I have personally reviewed the following labs and images: CBC: Recent Labs  Lab 08/23/19 0454  08/24/19 0449 08/24/19 1159 08/25/19 0445 08/26/19 0430 08/27/19 0614  WBC 7.2  --  6.8  --  6.0 7.8 7.6  HGB 9.0*   < > 8.8* 10.2* 8.9* 8.9* 8.2*  HCT 30.5*   < > 30.9* 30.0* 30.2* 31.3* 28.2*  MCV 90.2  --  91.2  --  88.0 89.2 88.4  PLT 349  --  322  --  334 328 292   < > = values in this interval not displayed.   BMP &GFR Recent Labs  Lab 08/21/19 0158  08/23/19 0454  08/24/19 0449  08/24/19 1536 08/24/19 1922 08/25/19 0445 08/25/19 1514 08/26/19 0430 08/27/19 0614  NA 136   < > 145   < > 146*   < > 149*  --  149* 152* 149* 145  K 4.5   < > 3.3*   < > 2.8*   < > 3.4*  --  2.6* 2.7* 3.2* 3.4*  CL 89*   < > 93*  --  95*  --  97*  --  99 102 103 100  CO2 32   < > 39*  --  37*  --  36*  --  36* 35* 35* 32  GLUCOSE 198*   < > 106*  --  111*  --  169*  --  156* 173* 101* 96  BUN 67*   < > 70*  --  65*  --  64*  --  75* 74* 70* 65*  CREATININE 2.53*   < > 2.60*  --  2.37*  --  2.44*  --  2.40* 2.18* 2.05* 2.08*  CALCIUM 8.4*   < > 8.8*  --  9.2  --  9.0  --  9.2 8.8* 9.2 8.8*  MG 2.4   < > 2.3  --  2.4  --   --   --  2.4  --  2.5* 2.3  PHOS 5.2*  --  4.5  --   --   --   --  4.0 2.5  --   --   --    < > = values in this interval not displayed.   Estimated Creatinine Clearance: 41.7 mL/min (  A) (by C-G formula based on SCr of 2.08 mg/dL  (H)). Liver & Pancreas: Recent Labs  Lab 08/20/19 2210  AST 26  ALT 31  ALKPHOS 172*  BILITOT 0.6  PROT 6.3*  ALBUMIN 3.1*   Recent Labs  Lab 08/20/19 2210  LIPASE 22   No results for input(s): AMMONIA in the last 168 hours. Diabetic: No results for input(s): HGBA1C in the last 72 hours. Recent Labs  Lab 08/26/19 2033 08/27/19 0041 08/27/19 0445 08/27/19 0736 08/27/19 1152  GLUCAP 182* 269* 74 147* 227*   Cardiac Enzymes: No results for input(s): CKTOTAL, CKMB, CKMBINDEX, TROPONINI in the last 168 hours. No results for input(s): PROBNP in the last 8760 hours. Coagulation Profile: Recent Labs  Lab 08/21/19 0017 08/21/19 0602 08/21/19 1201  INR 1.4* 1.5* 1.6*   Thyroid Function Tests: No results for input(s): TSH, T4TOTAL, FREET4, T3FREE, THYROIDAB in the last 72 hours. Lipid Profile: No results for input(s): CHOL, HDL, LDLCALC, TRIG, CHOLHDL, LDLDIRECT in the last 72 hours. Anemia Panel: No results for input(s): VITAMINB12, FOLATE, FERRITIN, TIBC, IRON, RETICCTPCT in the last 72 hours. Urine analysis: No results found for: COLORURINE, APPEARANCEUR, LABSPEC, PHURINE, GLUCOSEU, HGBUR, BILIRUBINUR, KETONESUR, PROTEINUR, UROBILINOGEN, NITRITE, LEUKOCYTESUR Sepsis Labs: Invalid input(s): PROCALCITONIN, [place]  Microbiology: Recent Results (from the past 240 hour(s))  SARS Coronavirus 2 by RT PCR (hospital order, performed in [facility] hospital lab) Nasopharyngeal Nasopharyngeal Swab     Status: None   Collection Time: 08/20/19 10:15 PM   Specimen: Nasopharyngeal Swab  Result Value Ref Range Status   SARS Coronavirus 2 NEGATIVE NEGATIVE Final    Comment: (NOTE) If result is NEGATIVE SARS-CoV-2 target nucleic acids are NOT DETECTED. The SARS-CoV-2 RNA is generally detectable in upper and lower  respiratory specimens during the acute phase of infection. The lowest  concentration of SARS-CoV-2 viral copies this assay can detect is 250  copies / mL. A  negative result does not preclude SARS-CoV-2 infection  and should not be used as the sole basis for treatment or other  patient management decisions.  A negative result may occur with  improper specimen collection / handling, submission of specimen other  than nasopharyngeal swab, presence of viral mutation(s) within the  areas targeted by this assay, and inadequate number of viral copies  (<250 copies / mL). A negative result must be combined with clinical  observations, patient history, and epidemiological information. If result is POSITIVE SARS-CoV-2 target nucleic acids are DETECTED. The SARS-CoV-2 RNA is generally detectable in upper and lower  respiratory specimens dur ing the acute phase of infection.  Positive  results are indicative of active infection with SARS-CoV-2.  Clinical  correlation with patient history and other diagnostic information is  necessary to determine patient infection status.  Positive results do  not rule out bacterial infection or co-infection with other viruses. If result is PRESUMPTIVE POSTIVE SARS-CoV-2 nucleic acids MAY BE PRESENT.   A presumptive positive result was obtained on the submitted specimen  and confirmed on repeat testing.  While 2019 novel coronavirus  (SARS-CoV-2) nucleic acids may be present in the submitted sample  additional confirmatory testing may be necessary for epidemiological  and / or clinical management purposes  to differentiate between  SARS-CoV-2 and other Sarbecovirus currently known to infect humans.  If clinically indicated additional testing with an alternate test  methodology [phone]) is advised. The SARS-CoV-2 RNA is generally  detectable in upper and lower respiratory sp ecimens during the acute  phase of infection. The  expected result is Negative. Fact Sheet for Patients:  StrictlyIdeas.no Fact Sheet for Healthcare Providers: BankingDealers.co.za This test is not  yet approved or cleared by the [place] FDA and has been authorized for detection and/or diagnosis of SARS-CoV-2 by FDA under an Emergency Use Authorization (EUA).  This EUA will remain in effect (meaning this test can be used) for the duration of the COVID-19 declaration under Section 564(b)(1) of the Act, 21 U.S.C. section 360bbb-3(b)(1), unless the authorization is terminated or revoked sooner. Performed at [facility], [place] [address], [place], [place] [address]   Blood culture (routine x 2)     Status: Abnormal   Collection Time: 08/20/19 10:35 PM   Specimen: BLOOD RIGHT HAND  Result Value Ref Range Status   Specimen Description BLOOD RIGHT HAND  Final   Special Requests   Final    BOTTLES DRAWN AEROBIC AND ANAEROBIC Blood Culture results may not be optimal due to an inadequate volume of blood received in culture bottles   Culture  Setup Time   Final    AEROBIC BOTTLE ONLY GRAM POSITIVE COCCI CRITICAL RESULT CALLED TO, READ BACK BY AND VERIFIED WITH: LAURA FAYE @ 0500 on 08/22/19 by robinson z.     Culture (A)  Final    STAPHYLOCOCCUS SPECIES (COAGULASE NEGATIVE) THE SIGNIFICANCE OF ISOLATING THIS ORGANISM FROM A SINGLE SET OF BLOOD CULTURES WHEN MULTIPLE SETS ARE DRAWN IS UNCERTAIN. PLEASE NOTIFY THE MICROBIOLOGY DEPARTMENT WITHIN ONE WEEK IF SPECIATION AND SENSITIVITIES ARE REQUIRED. Performed at [facility], [place] [address], [place], [place] [address]    Report Status 08/23/2019 FINAL  Final  Blood culture (routine x 2)     Status: None   Collection Time: 08/20/19 10:43 PM   Specimen: BLOOD LEFT HAND  Result Value Ref Range Status   Specimen Description BLOOD LEFT HAND  Final   Special Requests   Final    BOTTLES DRAWN AEROBIC ONLY Blood Culture adequate volume   Culture   Final    NO GROWTH 5 DAYS Performed at [facility], [place] [address], [place], [place] [address]    Report Status 08/25/2019 FINAL  Final  MRSA PCR Screening     Status: None    Collection Time: 08/21/19  1:57 AM   Specimen: Nasal Mucosa; Nasopharyngeal  Result Value Ref Range Status   MRSA by PCR NEGATIVE NEGATIVE Final    Comment:        The GeneXpert MRSA Assay (FDA approved for NASAL specimens only), is one component of a comprehensive MRSA colonization surveillance program. It is not intended to diagnose MRSA infection nor to guide or monitor treatment for MRSA infections. Performed at [facility], [place] [address], [place], [place] [address]   Culture, respiratory (non-expectorated)     Status: None (Preliminary result)   Collection Time: 08/25/19  9:52 AM   Specimen: Tracheal Aspirate; Respiratory  Result Value Ref Range Status   Specimen Description TRACHEAL ASPIRATE  Final   Special Requests NONE  Final   Gram Stain NO WBC SEEN NO ORGANISMS SEEN   Final   Culture   Final    CULTURE REINCUBATED FOR BETTER GROWTH Performed at [facility], [address], [place], [place] [address]    Report Status PENDING  Incomplete    Radiology Studies: Dg Abd Portable 1v  Result Date: 08/27/2019 CLINICAL DATA:  Abdominal pain. EXAM: PORTABLE ABDOMEN - 1 VIEW COMPARISON:  CT abdomen pelvis dated  January 02, 2019. Abdominal x-ray dated September 06, 2016. FINDINGS: The visualized bowel gas pattern is normal. No radio-opaque calculi or other significant radiographic abnormality are seen. No acute osseous abnormality. Prior left total hip arthroplasty. IMPRESSION: Negative. Electronically Signed   By: Titus Dubin M.D.   On: 08/27/2019 13:04     35 minutes with more than 50% spent in reviewing records, counseling patient and coordinating care.  Parley Pidcock T. [place]  If 7PM-7AM, please contact night-coverage [email or web address] Password [email or web address] 08/27/2019, 1:39 PM

## 2019-08-27 NOTE — Discharge Instructions (Signed)
Information on my medicine - ELIQUIS (apixaban)  Why was Eliquis prescribed for you? Eliquis was prescribed for you to reduce the risk of a blood clot forming that can cause a stroke if you have a medical condition called atrial fibrillation (a type of irregular heartbeat).  What do You need to know about Eliquis ? Take your Eliquis TWICE DAILY - one tablet in the morning and one tablet in the evening with or without food. If you have difficulty swallowing the tablet whole please discuss with your pharmacist how to take the medication safely.  Take Eliquis exactly as prescribed by your doctor and DO NOT stop taking Eliquis without talking to the doctor who prescribed the medication.  Stopping may increase your risk of developing a stroke.  Refill your prescription before you run out.  After discharge, you should have regular check-up appointments with your healthcare provider that is prescribing your Eliquis.  In the future your dose may need to be changed if your kidney function or weight changes by a significant amount or as you get older.  What do you do if you miss a dose? If you miss a dose, take it as soon as you remember on the same day and resume taking twice daily.  Do not take more than one dose of ELIQUIS at the same time to make up a missed dose.  Important Safety Information A possible side effect of Eliquis is bleeding. You should call your healthcare provider right away if you experience any of the following: ? Bleeding from an injury or your nose that does not stop. ? Unusual colored urine (red or dark brown) or unusual colored stools (red or black). ? Unusual bruising for unknown reasons. ? A serious fall or if you hit your head (even if there is no bleeding).  Some medicines may interact with Eliquis and might increase your risk of bleeding or clotting while on Eliquis. To help avoid this, consult your healthcare provider or pharmacist prior to using any new  prescription or non-prescription medications, including herbals, vitamins, non-steroidal anti-inflammatory drugs (NSAIDs) and supplements.  This website has more information on Eliquis (apixaban): http://[email or web address].com/eliquis/home

## 2019-08-28 ENCOUNTER — Encounter (HOSPITAL_COMMUNITY): Payer: Self-pay | Admitting: *Deleted

## 2019-08-28 ENCOUNTER — Inpatient Hospital Stay (HOSPITAL_COMMUNITY): Payer: Medicare Other

## 2019-08-28 DIAGNOSIS — J9601 Acute respiratory failure with hypoxia: Secondary | ICD-10-CM | POA: Diagnosis not present

## 2019-08-28 DIAGNOSIS — I469 Cardiac arrest, cause unspecified: Secondary | ICD-10-CM | POA: Diagnosis not present

## 2019-08-28 DIAGNOSIS — J9602 Acute respiratory failure with hypercapnia: Secondary | ICD-10-CM | POA: Diagnosis not present

## 2019-08-28 LAB — CULTURE, RESPIRATORY W GRAM STAIN: Gram Stain: NONE SEEN

## 2019-08-28 LAB — BASIC METABOLIC PANEL
Anion gap: 11 (ref 5–15)
BUN: 60 mg/dL — ABNORMAL HIGH (ref 8–23)
CO2: 32 mmol/L (ref 22–32)
Calcium: 8.2 mg/dL — ABNORMAL LOW (ref 8.9–10.3)
Chloride: 102 mmol/L (ref 98–111)
Creatinine, Ser: 1.63 mg/dL — ABNORMAL HIGH (ref 0.61–1.24)
GFR calc Af Amer: 48 mL/min — ABNORMAL LOW (ref >60)
GFR calc non Af Amer: 42 mL/min — ABNORMAL LOW (ref >60)
Glucose, Bld: 125 mg/dL — ABNORMAL HIGH (ref 70–99)
Potassium: 3.1 mmol/L — ABNORMAL LOW (ref 3.5–5.1)
Sodium: 145 mmol/L (ref 135–145)

## 2019-08-28 LAB — GLUCOSE, CAPILLARY
Glucose-Capillary: 131 mg/dL — ABNORMAL HIGH (ref 70–99)
Glucose-Capillary: 146 mg/dL — ABNORMAL HIGH (ref 70–99)
Glucose-Capillary: 156 mg/dL — ABNORMAL HIGH (ref 70–99)
Glucose-Capillary: 173 mg/dL — ABNORMAL HIGH (ref 70–99)
Glucose-Capillary: 183 mg/dL — ABNORMAL HIGH (ref 70–99)
Glucose-Capillary: 193 mg/dL — ABNORMAL HIGH (ref 70–99)

## 2019-08-28 LAB — CBC
HCT: 28.3 % — ABNORMAL LOW (ref 39.0–52.0)
Hemoglobin: 8.4 g/dL — ABNORMAL LOW (ref 13.0–17.0)
MCH: 25.6 pg — ABNORMAL LOW (ref 26.0–34.0)
MCHC: 29.7 g/dL — ABNORMAL LOW (ref 30.0–36.0)
MCV: 86.3 fL (ref 80.0–100.0)
Platelets: 304 10*3/uL (ref 150–400)
RBC: 3.28 MIL/uL — ABNORMAL LOW (ref 4.22–5.81)
RDW: 19.8 % — ABNORMAL HIGH (ref 11.5–15.5)
WBC: 8.5 10*3/uL (ref 4.0–10.5)
nRBC: 0.4 % — ABNORMAL HIGH (ref 0.0–0.2)

## 2019-08-28 LAB — OCCULT BLOOD X 1 CARD TO LAB, STOOL
Fecal Occult Bld: POSITIVE — AB
Fecal Occult Bld: POSITIVE — AB

## 2019-08-28 LAB — MAGNESIUM: Magnesium: 2.1 mg/dL (ref 1.7–2.4)

## 2019-08-28 MED ORDER — ACETAMINOPHEN 325 MG PO TABS: 650.0000 mg | ORAL_TABLET | ORAL | Status: DC | PRN

## 2019-08-28 MED ORDER — TRAMADOL HCL 50 MG PO TABS
50.0000 mg | ORAL_TABLET | Freq: Two times a day (BID) | ORAL | Status: DC | PRN
  Administered 2019-08-28 – 2019-08-29 (×3): 50 mg via ORAL
  Filled 2019-08-28 (×3): qty 1

## 2019-08-28 MED ORDER — POTASSIUM CHLORIDE 10 MEQ/100ML IV SOLN
10.0000 meq | INTRAVENOUS | Status: AC
  Administered 2019-08-28 (×2): 10 meq via INTRAVENOUS
  Filled 2019-08-28 (×2): qty 100

## 2019-08-28 MED ORDER — MORPHINE SULFATE (PF) 2 MG/ML IV SOLN: 0.5000 mg | INTRAVENOUS | Status: DC | PRN

## 2019-08-28 NOTE — Progress Notes (Signed)
Patient ID: Nicholas Caldwell, male   DOB: [birth year], 73 y.o.   MRN: [id number]     Advanced Heart Failure Rounding Note  PCP-Cardiologist: No primary care provider on file.   Subjective:    Initially in CCU with PEA arrest likely due to pain meds and noncompliance with Bipap.  Extubated 10/19.   10/21 was transferred to floor, found to be obtunded (had had oxycodone) and unable to protect airway with hypercarbic respiratory failure.  He was intubated again.    Extubated 10/22, now on nasal cannula.   On po diuretics. Respiratory status stable. No co-ox drawn today. Creatinine improved 2.0 -> 1.6.Marland KitchenWeight down 3 pounds. CVP 8   Objective:   Weight Range: 112.3 kg Body mass index is 34.53 kg/m.   Vital Signs:   Temp:  [98.3 F (36.8 C)-98.7 F (37.1 C)] 98.3 F (36.8 C) (10/25 1233) Pulse Rate:  [73-88] 76 (10/25 1233) Resp:  [20-23] 21 (10/25 1233) BP: (94-131)/(49-70) 115/49 (10/25 1233) SpO2:  [96 %-100 %] 98 % (10/25 1233) Weight:  [112.3 kg] 112.3 kg (10/25 0524) Last BM Date: 08/28/19  Weight change: Filed Weights   08/26/19 0220 08/27/19 0500 08/28/19 0524  Weight: 111.6 kg 113.6 kg 112.3 kg    Intake/Output:   Intake/Output Summary (Last 24 hours) at 08/28/2019 1530 Last data filed at 08/28/2019 1448 Gross per 24 hour  Intake 660 ml  Output 4600 ml  Net -3940 ml      Physical Exam    CVP 8 General:  Lying in bed. No resp difficulty HEENT: normal Neck: supple. RIJ TLC Carotids 2+ bilat; no bruits. No lymphadenopathy or thryomegaly appreciated. Cor: PMI nondisplaced. Regular rate & rhythm. No rubs, gallops or murmurs. Lungs: clear Abdomen: Obese soft, nontender, nondistended. No hepatosplenomegaly. No bruits or masses. Good bowel sounds. Extremities: no cyanosis, clubbing, rash, edema + Ted hose Neuro: alert & orientedx3, cranial nerves grossly intact. moves all 4 extremities w/o difficulty. Affect pleasant   Telemetry   NSR 70s.  Personally reviewed.    Labs    CBC Recent Labs    08/27/19 0614 08/28/19 0619  WBC 7.6 8.5  HGB 8.2* 8.4*  HCT 28.2* 28.3*  MCV 88.4 86.3  PLT 292 767   Basic Metabolic Panel Recent Labs    08/27/19 0614 08/28/19 0619  NA 145 145  K 3.4* 3.1*  CL 100 102  CO2 32 32  GLUCOSE 96 125*  BUN 65* 60*  CREATININE 2.08* 1.63*  CALCIUM 8.8* 8.2*  MG 2.3 2.1   Liver Function Tests No results for input(s): AST, ALT, ALKPHOS, BILITOT, PROT, ALBUMIN in the last 72 hours. No results for input(s): LIPASE, AMYLASE in the last 72 hours. Cardiac Enzymes No results for input(s): CKTOTAL, CKMB, CKMBINDEX, TROPONINI in the last 72 hours.  BNP: BNP (last 3 results) Recent Labs    08/20/19 2210  BNP 4,121.7*    ProBNP (last 3 results) No results for input(s): PROBNP in the last 8760 hours.   D-Dimer No results for input(s): DDIMER in the last 72 hours. Hemoglobin A1C No results for input(s): HGBA1C in the last 72 hours. Fasting Lipid Panel No results for input(s): CHOL, HDL, LDLCALC, TRIG, CHOLHDL, LDLDIRECT in the last 72 hours. Thyroid Function Tests No results for input(s): TSH, T4TOTAL, T3FREE, THYROIDAB in the last 72 hours.  Invalid input(s): FREET3  Other results:   Imaging    No results found.   Medications:     Scheduled Medications: . amiodarone  200  mg Oral BID  . apixaban  5 mg Oral BID  . arformoterol  15 mcg Nebulization BID  . budesonide (PULMICORT) nebulizer solution  0.5 mg Nebulization BID  . chlorhexidine  15 mL Mouth Rinse BID  . chlorhexidine gluconate (MEDLINE KIT)  15 mL Mouth Rinse BID  . Chlorhexidine Gluconate Cloth  6 each Topical Daily  . famotidine  20 mg Oral BID  . insulin aspart  0-20 Units Subcutaneous Q4H  . insulin glargine  10 Units Subcutaneous QHS  . ipratropium-albuterol  3 mL Nebulization BID  . mouth rinse  15 mL Mouth Rinse q12n4p  . polyethylene glycol  17 g Oral Daily  . predniSONE  40 mg Oral Q breakfast  . Ensure Max Protein  11  oz Oral Daily  . rosuvastatin  20 mg Oral QHS  . sodium chloride flush  10-40 mL Intracatheter Q12H  . torsemide  100 mg Oral BID    Infusions: . sodium chloride 10 mL/hr at 08/26/19 1100    PRN Medications: sodium chloride, acetaminophen, metoprolol tartrate, naLOXone (NARCAN)  injection, senna-docusate, sodium chloride flush, traMADol    Patient Profile   Nicholas Caldwell a 73 y.o.malewith a history of chronic systolic CHFdue to NICM (EF 40-45%)with moderate RV failure, COPD on 3 L O2, DM, HTN, OHS/OSA on CPAP, PAF s/p DCCV, hyperlipidemia, CAD, noncompliance, multiple admissions over the last 6 months due to HF and respiratory failure, readmitted after out of hospital PEA arrest. AHF consulted for management of acute on chronic biventricular failure (predominant RV failure), at the request of Dr. Elsworth Soho, Critical Care Medicine.   Assessment/Plan    1. Acute on chronic hypoxemic/hypercarbic respiratory failure: Baseline COPD and OHS/OSA.  He was not compliant with Bipap at home and was using opiates.  He had a PEA arrest and was intubated.  Got oxycodone on 10/20 while on floor, became obtunded with acute on chronic hypoxemic/hypercarbic respiratory failure and was intubated again. Extubated a 2nd time on 10/22.  This morning he remains on nasal cannula.  - mental status and respiratory status much improved. - Limit opioids.  - Continue to strongly encourage Bipap use at night, he is not wearing it much.    2. Chronic systolic => diastolic CHF: With prominent RV failure, likely related to COPD/OSA/OHS. TEE in 3/20 with EF 40-45%, severely dilated RV with moderately decreased systolic function. Echo this admission showed EF up to 60-65% but with severely dilated and dysfunctional RV. He has had multiple recent admissions with CHF and COPD exacerbations. He is chronically NYHA class IIIb. He is on high doses of diuretics at home but has remained volume overloaded as outpatient.  Admitted  volume overloaded with intractable RV failure. CVP 10 today,  - Suspect this is about as good as we can get his volume status in setting of RV failure. Creatinine stable.  - Continue po torsemide 100 bid  -Bidil on hold with soft BP. Will restart at 1/2 tab tid 2.Atrial fibrillation: Paroxysmal. He has history of CVA. He had DCCV in 3/20 and again in 5/20.  He failed amiodarone in the past and cannot take Tikosyn due to baseline prolonged QT interval.  Went back into atrial fibrillation this admit with RVR. Now in NSR on amiodarone.  - Remains in NSR - Continue apixaban 5 mg bid   - Poor ablation candidate with LA size.  - Continue po amiodarone.  3. COPD:No longer smokes. Continue homeoxygen. CT chest showed emphysema. 4.OHS/OSA: Continue 3 L oxygen during  the day and CPAP at night at baseline.   - Needs to avoid narcotics.   5.CAD: Nonobstructive disease in 2/20. No s/s ischemia - Continue crestor 20 mg daily. - He is on apixaban so no ASA.   6. AKI on CKD Stage 3: Creatinine is trending down.  7. Pleural effusion: S/p recent thoracentesis for right pleural effusion. Effusions remain present.  8. Hypokalemia: K 3.1 Replace.    Ok for d/c from a HF perspective on   Torsemide 100 bid Bidil 1/2 tab tid (reduced dose) Metolazone 2.5 T, TH,S,Sun Spiro 25 Potassium (as previously prescribed) Crestor 20  Amio 200 bid Eliquis 5 bid  Stop bisoprolol    Glori Bickers MD 08/28/2019 3:30 PM

## 2019-08-28 NOTE — Progress Notes (Signed)
PROGRESS NOTE    Nicholas Caldwell  QQV:[id number] DOB: May 30, 1946 DOA: 08/20/2019 PCP: [name], MD  Brief Narrative:   73 year old gentleman with prior history of COPD, chronic respiratory failure on 3 L of nasal cannula at home, obstructive sleep apnea, noncompliant with BiPAP, paroxysmal atrial fibrillation on Eliquis, chronic systolic and diastolic heart failure s/p AICD, chronic pain syndrome, essential hypertension, chronic venous insufficiency admitted post cardiac arrest/ROSC after 12 minutes.  He was admitted to ICU post cardiac arrest for acute on chronic respiratory failure secondary to probably COPD and CHF exacerbation he was intubated on the 17th and extubated on 18 October.  He was reintubated 20 October and extubated the next day.  He was transferred to [facility] service on 08/26/2019. Patient is on BiPAP at night and nasal cannula oxygen during the day.   Assessment & Plan:   Active Problems:   Cardiac arrest ([place])   Pressure injury of skin   Acute respiratory failure with hypercapnia (HCC)   Encounter for intubation   Acute on chronic respiratory failure with hypoxia and hypercapnia probably secondary to obstructive sleep apnea, COPD and acute on chronic systolic and diastolic heart failure, noncompliance with BiPAP and iatrogenic.(Probably from narcotic pain medications) COVID-19 screening test is negative. Cardiology/heart failure on board and appreciate their recommendations. Patient currently on nasal cannula oxygen and home BiPAP at night.    Acute on chronic combined systolic and diastolic heart failure. Last echocardiogram showed EF 60 to 65%, severely dilated and dysfunctional RV. Diuresed appropriately plan to discharge on 100 mg of torsemide twice daily along with spironolactone 25 mg daily. He was started on Bidil half a tablet 3 times daily.    Paroxysmal atrial fibrillation S/p [place] V, last date 2020.  Currently sinus rhythm on amiodarone.  Continue with  apixaban 5 mg twice daily.  Obstructive sleep apnea Continue with BiPAP at night.    Coronary artery disease : nonobstructive. Patient denies any chest pain at this time continue with Crestor 20 mg daily.    Acute on stage III CKD Improving creatinine at 1.6 today    Pleural effusion s/p thoracentesis on the right probably secondary to acute CHF     Hypokalemia Replaced, probably from diuretics    Hyperkalemia Continue with statin    Abdominal distention with some discomfort KUB not impressive. Patient reports persistent abdominal discomfort and not able to have splatters. CT of the abdomen without contrast ordered for further evaluation.    Stage II right sacral and buttocks pressure injury Present on admission   Diabetes mellitus Hemoglobin A1c of 7.4  CBG (last 3)  Recent Labs    08/28/19 0739 08/28/19 1116 08/28/19 1659  GLUCAP 131* 173* 183*   Resume sliding scale insulin and Lantus.    Anemia of chronic disease Anemia panel reviewed. Fecal occult blood is positive We do not know his baseline hemoglobin, currently stable around 8.  Will get GI input for further evaluation.    Coag negative staph in blood cultures:  Contaminant.     Copd Exacerbation:  Improving.  No wheezing heard today. Continue with brovana and duonebs.  BIPAP at night.   Acute metabolic encephalopathy:  Resolved.    DVT prophylaxis: eliquis.  Code Status: full code.  Family Communication: none at bedside.  Disposition Plan:pending clinical improvement.    Consultants:  cardiology  Procedures:  CPR 10/17 ETT 10/17-10/18, 10/20-10/21  Antimicrobials:  None.   Subjective: Mild abd discomfort.  No chest pain. No sob.  Mildly nauseated.  No vomiting .   Objective: Vitals:   08/28/19 0752 08/28/19 0808 08/28/19 0900 08/28/19 1233  BP:  111/61 (!) 123/58 (!) 115/49  Pulse:  73 88 76  Resp:  20  (!) 21  Temp:  98.7 F (37.1 C)  98.3 F (36.8  C)  TempSrc:  Oral  Oral  SpO2: 96% 96%  98%  Weight:      Height:        Intake/Output Summary (Last 24 hours) at 08/28/2019 1641 Last data filed at 08/28/2019 1600 Gross per 24 hour  Intake 660 ml  Output 4601 ml  Net -3941 ml   Filed Weights   08/26/19 0220 08/27/19 0500 08/28/19 0524  Weight: 111.6 kg 113.6 kg 112.3 kg    Examination:  General exam: mild discomfort from abdominal distension.  Respiratory system: diminished at bases,  Cardiovascular system: S1 & S2 heard, RRR.  Gastrointestinal system: Abdomen is distended, mildy tender, bowel sounds heard.  Central nervous system: Alert and oriented. Grossly non focal.  Extremities: pedal edema present.  Skin: stage 2 ulcers on the  sacral and buttocks Psychiatry:  Mood & affect appropriate.     Data Reviewed: I have personally reviewed following labs and imaging studies  CBC: Recent Labs  Lab 08/24/19 0449 08/24/19 1159 08/25/19 0445 08/26/19 0430 08/27/19 0614 08/28/19 0619  WBC 6.8  --  6.0 7.8 7.6 8.5  HGB 8.8* 10.2* 8.9* 8.9* 8.2* 8.4*  HCT 30.9* 30.0* 30.2* 31.3* 28.2* 28.3*  MCV 91.2  --  88.0 89.2 88.4 86.3  PLT 322  --  334 328 292 270   Basic Metabolic Panel: Recent Labs  Lab 08/23/19 0454  08/24/19 0449  08/24/19 1922 08/25/19 0445 08/25/19 1514 08/26/19 0430 08/27/19 0614 08/28/19 0619  NA 145   < > 146*   < >  --  149* 152* 149* 145 145  K 3.3*   < > 2.8*   < >  --  2.6* 2.7* 3.2* 3.4* 3.1*  CL 93*  --  95*   < >  --  99 102 103 100 102  CO2 39*  --  37*   < >  --  36* 35* 35* 32 32  GLUCOSE 106*  --  111*   < >  --  156* 173* 101* 96 125*  BUN 70*  --  65*   < >  --  75* 74* 70* 65* 60*  CREATININE 2.60*  --  2.37*   < >  --  2.40* 2.18* 2.05* 2.08* 1.63*  CALCIUM 8.8*  --  9.2   < >  --  9.2 8.8* 9.2 8.8* 8.2*  MG 2.3  --  2.4  --   --  2.4  --  2.5* 2.3 2.1  PHOS 4.5  --   --   --  4.0 2.5  --   --   --   --    < > = values in this interval not displayed.   GFR: Estimated  Creatinine Clearance: 53 mL/min (A) (by C-G formula based on SCr of 1.63 mg/dL (H)). Liver Function Tests: No results for input(s): AST, ALT, ALKPHOS, BILITOT, PROT, ALBUMIN in the last 168 hours. No results for input(s): LIPASE, AMYLASE in the last 168 hours. No results for input(s): AMMONIA in the last 168 hours. Coagulation Profile: No results for input(s): INR, PROTIME in the last 168 hours. Cardiac Enzymes: No results for input(s): CKTOTAL, CKMB, CKMBINDEX, TROPONINI in the last 168  hours. BNP (last 3 results) No results for input(s): PROBNP in the last 8760 hours. HbA1C: No results for input(s): HGBA1C in the last 72 hours. CBG: Recent Labs  Lab 08/27/19 2037 08/28/19 0045 08/28/19 0523 08/28/19 0739 08/28/19 1116  GLUCAP 168* 156* 146* 131* 173*   Lipid Profile: No results for input(s): CHOL, HDL, LDLCALC, TRIG, CHOLHDL, LDLDIRECT in the last 72 hours. Thyroid Function Tests: No results for input(s): TSH, T4TOTAL, FREET4, T3FREE, THYROIDAB in the last 72 hours. Anemia Panel: Recent Labs    08/27/19 1713  VITAMINB12 396  FOLATE 7.0  FERRITIN 35  TIBC 363  IRON 85  RETICCTPCT 2.0   Sepsis Labs: Recent Labs  Lab 08/22/19 0231 08/23/19 0454  PROCALCITON 0.73 0.41    Recent Results (from the past 240 hour(s))  SARS Coronavirus 2 by RT PCR (hospital order, performed in [facility] hospital lab) Nasopharyngeal Nasopharyngeal Swab     Status: None   Collection Time: 08/20/19 10:15 PM   Specimen: Nasopharyngeal Swab  Result Value Ref Range Status   SARS Coronavirus 2 NEGATIVE NEGATIVE Final    Comment: (NOTE) If result is NEGATIVE SARS-CoV-2 target nucleic acids are NOT DETECTED. The SARS-CoV-2 RNA is generally detectable in upper and lower  respiratory specimens during the acute phase of infection. The lowest  concentration of SARS-CoV-2 viral copies this assay can detect is 250  copies / mL. A negative result does not preclude SARS-CoV-2 infection  and  should not be used as the sole basis for treatment or other  patient management decisions.  A negative result may occur with  improper specimen collection / handling, submission of specimen other  than nasopharyngeal swab, presence of viral mutation(s) within the  areas targeted by this assay, and inadequate number of viral copies  (<250 copies / mL). A negative result must be combined with clinical  observations, patient history, and epidemiological information. If result is POSITIVE SARS-CoV-2 target nucleic acids are DETECTED. The SARS-CoV-2 RNA is generally detectable in upper and lower  respiratory specimens dur ing the acute phase of infection.  Positive  results are indicative of active infection with SARS-CoV-2.  Clinical  correlation with patient history and other diagnostic information is  necessary to determine patient infection status.  Positive results do  not rule out bacterial infection or co-infection with other viruses. If result is PRESUMPTIVE POSTIVE SARS-CoV-2 nucleic acids MAY BE PRESENT.   A presumptive positive result was obtained on the submitted specimen  and confirmed on repeat testing.  While 2019 novel coronavirus  (SARS-CoV-2) nucleic acids may be present in the submitted sample  additional confirmatory testing may be necessary for epidemiological  and / or clinical management purposes  to differentiate between  SARS-CoV-2 and other Sarbecovirus currently known to infect humans.  If clinically indicated additional testing with an alternate test  methodology [phone]) is advised. The SARS-CoV-2 RNA is generally  detectable in upper and lower respiratory sp ecimens during the acute  phase of infection. The expected result is Negative. Fact Sheet for Patients:  StrictlyIdeas.no Fact Sheet for Healthcare Providers: BankingDealers.co.za This test is not yet approved or cleared by the [place] FDA and has been  authorized for detection and/or diagnosis of SARS-CoV-2 by FDA under an Emergency Use Authorization (EUA).  This EUA will remain in effect (meaning this test can be used) for the duration of the COVID-19 declaration under Section 564(b)(1) of the Act, 21 U.S.C. section 360bbb-3(b)(1), unless the authorization is terminated or revoked sooner. Performed  at [facility], [place] [address], [place], [place] [address]   Blood culture (routine x 2)     Status: Abnormal   Collection Time: 08/20/19 10:35 PM   Specimen: BLOOD RIGHT HAND  Result Value Ref Range Status   Specimen Description BLOOD RIGHT HAND  Final   Special Requests   Final    BOTTLES DRAWN AEROBIC AND ANAEROBIC Blood Culture results may not be optimal due to an inadequate volume of blood received in culture bottles   Culture  Setup Time   Final    AEROBIC BOTTLE ONLY GRAM POSITIVE COCCI CRITICAL RESULT CALLED TO, READ BACK BY AND VERIFIED WITH: LAURA FAYE @ 0500 on 08/22/19 by robinson z.     Culture (A)  Final    STAPHYLOCOCCUS SPECIES (COAGULASE NEGATIVE) THE SIGNIFICANCE OF ISOLATING THIS ORGANISM FROM A SINGLE SET OF BLOOD CULTURES WHEN MULTIPLE SETS ARE DRAWN IS UNCERTAIN. PLEASE NOTIFY THE MICROBIOLOGY DEPARTMENT WITHIN ONE WEEK IF SPECIATION AND SENSITIVITIES ARE REQUIRED. Performed at [facility], [place] [address], [place], [place] [address]    Report Status 08/23/2019 FINAL  Final  Blood culture (routine x 2)     Status: None   Collection Time: 08/20/19 10:43 PM   Specimen: BLOOD LEFT HAND  Result Value Ref Range Status   Specimen Description BLOOD LEFT HAND  Final   Special Requests   Final    BOTTLES DRAWN AEROBIC ONLY Blood Culture adequate volume   Culture   Final    NO GROWTH 5 DAYS Performed at [facility], [place] [address] [place], [place] [address]    Report Status 08/25/2019 FINAL  Final  MRSA PCR Screening     Status: None   Collection Time: 08/21/19  1:57 AM   Specimen: Nasal Mucosa;  Nasopharyngeal  Result Value Ref Range Status   MRSA by PCR NEGATIVE NEGATIVE Final    Comment:        The GeneXpert MRSA Assay (FDA approved for NASAL specimens only), is one component of a comprehensive MRSA colonization surveillance program. It is not intended to diagnose MRSA infection nor to guide or monitor treatment for MRSA infections. Performed at [facility], [place] [address], [place], [place] [address]   Culture, respiratory (non-expectorated)     Status: None   Collection Time: 08/25/19  9:52 AM   Specimen: Tracheal Aspirate; Respiratory  Result Value Ref Range Status   Specimen Description TRACHEAL ASPIRATE  Final   Special Requests NONE  Final   Gram Stain   Final    NO WBC SEEN NO ORGANISMS SEEN Performed at [facility], [address], [place], [place] [address]    Culture RARE CANDIDA ALBICANS  Final   Report Status 08/28/2019 FINAL  Final         Radiology Studies: Dg Abd Portable 1v  Result Date: 08/27/2019 CLINICAL DATA:  Abdominal pain. EXAM: PORTABLE ABDOMEN - 1 VIEW COMPARISON:  CT abdomen pelvis dated January 02, 2019. Abdominal x-ray dated September 06, 2016. FINDINGS: The visualized bowel gas pattern is normal. No radio-opaque calculi or other significant radiographic abnormality are seen. No acute osseous abnormality. Prior left total hip arthroplasty. IMPRESSION: Negative. Electronically Signed   By: Titus Dubin M.D.   On: 08/27/2019 13:04        Scheduled Meds: . amiodarone  200 mg Oral BID  . apixaban  5 mg Oral BID  . arformoterol  15 mcg Nebulization BID  .  budesonide (PULMICORT) nebulizer solution  0.5 mg Nebulization BID  . chlorhexidine  15 mL Mouth Rinse BID  . chlorhexidine gluconate (MEDLINE KIT)  15 mL Mouth Rinse BID  . Chlorhexidine Gluconate Cloth  6 each Topical Daily  . famotidine  20 mg Oral BID  . insulin aspart  0-20 Units Subcutaneous Q4H  . insulin glargine  10 Units Subcutaneous QHS  .  ipratropium-albuterol  3 mL Nebulization BID  . mouth rinse  15 mL Mouth Rinse q12n4p  . polyethylene glycol  17 g Oral Daily  . predniSONE  40 mg Oral Q breakfast  . Ensure Max Protein  11 oz Oral Daily  . rosuvastatin  20 mg Oral QHS  . sodium chloride flush  10-40 mL Intracatheter Q12H  . torsemide  100 mg Oral BID   Continuous Infusions: . sodium chloride 10 mL/hr at 08/26/19 1100     LOS: 8 days        Hosie Poisson, MD [facility] Pager [phone]  If 7PM-7AM, please contact night-coverage [email or web address] Password [email or web address] 08/28/2019, 4:41 PM

## 2019-08-28 NOTE — Progress Notes (Signed)
Patient not ready to go on BIPAP at this time. Told patient to call RT when ready.

## 2019-08-29 ENCOUNTER — Other Ambulatory Visit (HOSPITAL_COMMUNITY): Payer: No Typology Code available for payment source

## 2019-08-29 ENCOUNTER — Telehealth (HOSPITAL_COMMUNITY): Payer: Self-pay

## 2019-08-29 DIAGNOSIS — J9602 Acute respiratory failure with hypercapnia: Secondary | ICD-10-CM | POA: Diagnosis not present

## 2019-08-29 DIAGNOSIS — D649 Anemia, unspecified: Secondary | ICD-10-CM | POA: Diagnosis not present

## 2019-08-29 DIAGNOSIS — R195 Other fecal abnormalities: Secondary | ICD-10-CM

## 2019-08-29 DIAGNOSIS — I50810 Right heart failure, unspecified: Secondary | ICD-10-CM | POA: Diagnosis not present

## 2019-08-29 LAB — CBC
HCT: 28.9 % — ABNORMAL LOW (ref 39.0–52.0)
Hemoglobin: 8.8 g/dL — ABNORMAL LOW (ref 13.0–17.0)
MCH: 25.8 pg — ABNORMAL LOW (ref 26.0–34.0)
MCHC: 30.4 g/dL (ref 30.0–36.0)
MCV: 84.8 fL (ref 80.0–100.0)
Platelets: 331 10*3/uL (ref 150–400)
RBC: 3.41 MIL/uL — ABNORMAL LOW (ref 4.22–5.81)
RDW: 19.5 % — ABNORMAL HIGH (ref 11.5–15.5)
WBC: 8.4 10*3/uL (ref 4.0–10.5)
nRBC: 0.2 % (ref 0.0–0.2)

## 2019-08-29 LAB — BASIC METABOLIC PANEL
Anion gap: 12 (ref 5–15)
BUN: 64 mg/dL — ABNORMAL HIGH (ref 8–23)
CO2: 34 mmol/L — ABNORMAL HIGH (ref 22–32)
Calcium: 9 mg/dL (ref 8.9–10.3)
Chloride: 97 mmol/L — ABNORMAL LOW (ref 98–111)
Creatinine, Ser: 1.64 mg/dL — ABNORMAL HIGH (ref 0.61–1.24)
GFR calc Af Amer: 48 mL/min — ABNORMAL LOW (ref >60)
GFR calc non Af Amer: 41 mL/min — ABNORMAL LOW (ref >60)
Glucose, Bld: 161 mg/dL — ABNORMAL HIGH (ref 70–99)
Potassium: 3 mmol/L — ABNORMAL LOW (ref 3.5–5.1)
Sodium: 143 mmol/L (ref 135–145)

## 2019-08-29 LAB — GLUCOSE, CAPILLARY
Glucose-Capillary: 123 mg/dL — ABNORMAL HIGH (ref 70–99)
Glucose-Capillary: 136 mg/dL — ABNORMAL HIGH (ref 70–99)
Glucose-Capillary: 153 mg/dL — ABNORMAL HIGH (ref 70–99)
Glucose-Capillary: 153 mg/dL — ABNORMAL HIGH (ref 70–99)

## 2019-08-29 LAB — MAGNESIUM: Magnesium: 2 mg/dL (ref 1.7–2.4)

## 2019-08-29 MED ORDER — SPIRONOLACTONE 25 MG PO TABS
25.0000 mg | ORAL_TABLET | Freq: Every day | ORAL | Status: DC
  Administered 2019-08-29: 25 mg via ORAL
  Filled 2019-08-29: qty 1

## 2019-08-29 MED ORDER — FAMOTIDINE 20 MG PO TABS: 20.0000 mg | ORAL_TABLET | Freq: Two times a day (BID) | ORAL | 0 refills | Status: DC

## 2019-08-29 MED ORDER — PREDNISONE 20 MG PO TABS: 20.0000 mg | ORAL_TABLET | Freq: Every day | ORAL | 0 refills | Status: DC

## 2019-08-29 MED ORDER — ENSURE MAX PROTEIN PO LIQD: 11.0000 [oz_av] | Freq: Every day | ORAL | Status: DC

## 2019-08-29 MED ORDER — AMIODARONE HCL 200 MG PO TABS: ORAL_TABLET | ORAL | 0 refills | Status: DC

## 2019-08-29 MED ORDER — SENNOSIDES-DOCUSATE SODIUM 8.6-50 MG PO TABS: 1.0000 | ORAL_TABLET | Freq: Every evening | ORAL | 0 refills | Status: DC | PRN

## 2019-08-29 MED ORDER — "INSULIN SYRINGE 27G X 1/2"" 0.5 ML MISC": 100.0000 | Freq: Three times a day (TID) | 1 refills | Status: AC

## 2019-08-29 MED ORDER — POLYETHYLENE GLYCOL 3350 17 G PO PACK: 17.0000 g | PACK | Freq: Every day | ORAL | 0 refills | Status: DC | PRN

## 2019-08-29 MED ORDER — AMIODARONE HCL 200 MG PO TABS: 200.0000 mg | ORAL_TABLET | Freq: Two times a day (BID) | ORAL | 1 refills | Status: DC

## 2019-08-29 MED ORDER — IPRATROPIUM-ALBUTEROL 0.5-2.5 (3) MG/3ML IN SOLN: 3.0000 mL | Freq: Four times a day (QID) | RESPIRATORY_TRACT | 3 refills | Status: DC | PRN

## 2019-08-29 MED ORDER — APIXABAN 5 MG PO TABS: 5.0000 mg | ORAL_TABLET | Freq: Two times a day (BID) | ORAL | 1 refills | Status: DC

## 2019-08-29 NOTE — Progress Notes (Addendum)
Patient ID: Nicholas Caldwell, male   DOB: [birth year], 73 y.o.   MRN: [id number]     Advanced Heart Failure Rounding Note  PCP-Cardiologist: [name]   Subjective:    Initially in CCU with PEA arrest likely due to pain meds and noncompliance with Bipap.  Extubated 10/19.   10/21 was transferred to floor, found to be obtunded (had had oxycodone) and unable to protect airway with hypercarbic respiratory failure.  He was intubated again.    Extubated 10/22, now on nasal cannula.   On po diuretics. Respiratory status stable. Creatinine improved 2.0>> 1.6. Weigh continues to trend down, down 3 pounds from 247>>244. CVP 7  He feels ok. Eager to go home. Currently working w/ PT. ? If he will need SNF placement.    Objective:   Weight Range: 110.7 kg Body mass index is 34.05 kg/m.   Vital Signs:   Temp:  [98.3 F (36.8 C)-98.9 F (37.2 C)] 98.9 F (37.2 C) (10/26 0513) Pulse Rate:  [64-88] 64 (10/26 0513) Resp:  [18-26] 23 (10/26 0811) BP: (107-123)/(49-62) 107/62 (10/26 0513) SpO2:  [98 %-100 %] 98 % (10/26 0811) FiO2 (%):  [40 %-50 %] 50 % (10/26 0811) Weight:  [110.7 kg] 110.7 kg (10/26 0513) Last BM Date: 08/28/19  Weight change: Filed Weights   08/27/19 0500 08/28/19 0524 08/29/19 0513  Weight: 113.6 kg 112.3 kg 110.7 kg    Intake/Output:   Intake/Output Summary (Last 24 hours) at 08/29/2019 0859 Last data filed at 08/29/2019 0200 Gross per 24 hour  Intake 1192 ml  Output 2701 ml  Net -1509 ml      Physical Exam    CVP 7 PHYSICAL EXAM: General:  Obese AAM.  No respiratory difficulty HEENT: normal Neck: supple. no JVD. Carotids 2+ bilat; no bruits. No lymphadenopathy or thyromegaly appreciated. Cor: PMI nondisplaced. Regular rate & rhythm. No rubs, gallops or murmurs. Lungs: clear Abdomen: soft, nontender, nondistended. No hepatosplenomegaly. No bruits or masses. Good bowel sounds. Extremities: no cyanosis, clubbing, rash, edema Neuro: alert & oriented x 3,  cranial nerves grossly intact. moves all 4 extremities w/o difficulty. Affect pleasant.    Telemetry   NSR 80s.  Personally reviewed.   Labs    CBC Recent Labs    08/28/19 0619 08/29/19 0500  WBC 8.5 8.4  HGB 8.4* 8.8*  HCT 28.3* 28.9*  MCV 86.3 84.8  PLT 304 111   Basic Metabolic Panel Recent Labs    08/27/19 0614 08/28/19 0619 08/29/19 0500  NA 145 145  --   K 3.4* 3.1*  --   CL 100 102  --   CO2 32 32  --   GLUCOSE 96 125*  --   BUN 65* 60*  --   CREATININE 2.08* 1.63*  --   CALCIUM 8.8* 8.2*  --   MG 2.3 2.1 2.0   Liver Function Tests No results for input(s): AST, ALT, ALKPHOS, BILITOT, PROT, ALBUMIN in the last 72 hours. No results for input(s): LIPASE, AMYLASE in the last 72 hours. Cardiac Enzymes No results for input(s): CKTOTAL, CKMB, CKMBINDEX, TROPONINI in the last 72 hours.  BNP: BNP (last 3 results) Recent Labs    08/20/19 2210  BNP 4,121.7*    ProBNP (last 3 results) No results for input(s): PROBNP in the last 8760 hours.   D-Dimer No results for input(s): DDIMER in the last 72 hours. Hemoglobin A1C No results for input(s): HGBA1C in the last 72 hours. Fasting Lipid Panel No results for input(s):  CHOL, HDL, LDLCALC, TRIG, CHOLHDL, LDLDIRECT in the last 72 hours. Thyroid Function Tests No results for input(s): TSH, T4TOTAL, T3FREE, THYROIDAB in the last 72 hours.  Invalid input(s): FREET3  Other results:   Imaging    Ct Abdomen Pelvis Wo Contrast  Result Date: 08/28/2019 CLINICAL DATA:  Abdominal distension with nausea and vomiting EXAM: CT ABDOMEN AND PELVIS WITHOUT CONTRAST TECHNIQUE: Multidetector CT imaging of the abdomen and pelvis was performed following the standard protocol without IV contrast. COMPARISON:  01/02/2019 FINDINGS: Lower chest: Right-sided pleural effusion is noted of a moderate size. Bilateral lower lobe infiltrates are seen. Hepatobiliary: No focal liver abnormality is seen. No gallstones, gallbladder wall  thickening, or biliary dilatation. Pancreas: Unremarkable. No pancreatic ductal dilatation or surrounding inflammatory changes. Spleen: Multiple small splenules are noted. Adrenals/Urinary Tract: Right adrenal gland is within normal limits. There is a left adrenal lesion stable from prior exam measuring 2 cm consistent with an adenoma. Kidneys are well visualized bilaterally. No renal calculi or obstructive changes are seen. Ureters are unremarkable. The bladder is partially distended. Stomach/Bowel: The appendix is within normal limits. No obstructive or inflammatory changes of the colon are seen. Small bowel and stomach within normal limits. Vascular/Lymphatic: Aortic atherosclerosis. No enlarged abdominal or pelvic lymph nodes. Reproductive: Prostate is unremarkable. Other: No abdominal wall hernia or abnormality. No abdominopelvic ascites. Musculoskeletal: Degenerative changes of the lumbar spine are seen. Left hip replacement is noted. Old healed rib fractures are noted. IMPRESSION: Bilateral lower lobe infiltrates with right-sided pleural effusion. Changes have increased somewhat in the interval from the prior exam. Stable left adrenal lesion likely representing an adenoma. Chronic changes as described above. Electronically Signed   By: Inez Catalina M.D.   On: 08/28/2019 23:03     Medications:     Scheduled Medications: . amiodarone  200 mg Oral BID  . apixaban  5 mg Oral BID  . arformoterol  15 mcg Nebulization BID  . budesonide (PULMICORT) nebulizer solution  0.5 mg Nebulization BID  . chlorhexidine  15 mL Mouth Rinse BID  . chlorhexidine gluconate (MEDLINE KIT)  15 mL Mouth Rinse BID  . Chlorhexidine Gluconate Cloth  6 each Topical Daily  . famotidine  20 mg Oral BID  . insulin aspart  0-20 Units Subcutaneous Q4H  . insulin glargine  10 Units Subcutaneous QHS  . ipratropium-albuterol  3 mL Nebulization BID  . mouth rinse  15 mL Mouth Rinse q12n4p  . polyethylene glycol  17 g Oral Daily   . predniSONE  40 mg Oral Q breakfast  . Ensure Max Protein  11 oz Oral Daily  . rosuvastatin  20 mg Oral QHS  . sodium chloride flush  10-40 mL Intracatheter Q12H  . torsemide  100 mg Oral BID    Infusions: . sodium chloride 10 mL/hr at 08/26/19 1100    PRN Medications: sodium chloride, acetaminophen, metoprolol tartrate, naLOXone (NARCAN)  injection, senna-docusate, sodium chloride flush, traMADol    Patient Profile   Nicholas Caldwell a 73 y.o.malewith a history of chronic systolic CHFdue to NICM (EF 40-45%)with moderate RV failure, COPD on 3 L O2, DM, HTN, OHS/OSA on CPAP, PAF s/p DCCV, hyperlipidemia, CAD, noncompliance, multiple admissions over the last 6 months due to HF and respiratory failure, readmitted after out of hospital PEA arrest. AHF consulted for management of acute on chronic biventricular failure (predominant RV failure), at the request of Dr. Elsworth Soho, Critical Care Medicine.   Assessment/Plan    1. Acute on chronic hypoxemic/hypercarbic respiratory failure:  Baseline COPD and OHS/OSA.  He was not compliant with Bipap at home and was using opiates.  He had a PEA arrest and was intubated.  Got oxycodone on 10/20 while on floor, became obtunded with acute on chronic hypoxemic/hypercarbic respiratory failure and was intubated again. Extubated a 2nd time on 10/22.  Respiratory status improved. Currently on nasal cannula.  - mental status and respiratory status much improved. - Limit opioids.  - Continue to strongly encourage Bipap use at night, he is not wearing it much.    2. Chronic systolic => diastolic CHF: With prominent RV failure, likely related to COPD/OSA/OHS. TEE in 3/20 with EF 40-45%, severely dilated RV with moderately decreased systolic function. Echo this admission showed EF up to 60-65% but with severely dilated and dysfunctional RV. He has had multiple recent admissions with CHF and COPD exacerbations. He is chronically NYHA class IIIb. He is on high doses  of diuretics at home but has remained volume overloaded as outpatient.  Admitted volume overloaded with intractable RV failure. CVP 7 today,  - Suspect this is about as good as we can get his volume status in setting of RV failure. Creatinine stable.  - Continue po torsemide 100 bid  -Bidil on hold for soft BP => he can stay off Bidil as LV EF is now in normal range.   - Continue spiro 25 daily  2.Atrial fibrillation: Paroxysmal. He has history of CVA. He had DCCV in 3/20 and again in 5/20.  He failed amiodarone in the past and cannot take Tikosyn due to baseline prolonged QT interval.  Went back into atrial fibrillation this admit with RVR. Now in NSR on amiodarone.  - Remains in NSR - Continue apixaban 5 mg bid   - Poor ablation candidate with LA size.  - Continue po amiodarone.  3. COPD:No longer smokes. Continue homeoxygen. CT chest showed emphysema. 4.OHS/OSA: Continue 3 L oxygen during the day and CPAP at night at baseline.   - Needs to avoid narcotics.   5.CAD: Nonobstructive disease in 2/20. No s/s ischemia - Continue crestor 20 mg daily. - He is on apixaban so no ASA.   6. AKI on CKD Stage 3: Creatinine is trending down.  7. Pleural effusion: S/p recent thoracentesis for right pleural effusion. Effusions remain present.  8. Hypokalemia: supp as needed.   MD to follow w/ further recommendations.    Lyda Jester [facility]  08/29/2019 8:59 AM   Patient seen with PA, agree with the above note.   He is stable today, no complaints.  Remains in NSR.   General: NAD Neck: No JVD, no thyromegaly or thyroid nodule.  Lungs: Clear to auscultation bilaterally with normal respiratory effort. CV: Nondisplaced PMI.  Heart regular S1/S2, no S3/S4, no murmur.  Trace ankle edema.   Abdomen: Soft, nontender, no hepatosplenomegaly, no distention.  Skin: Intact without lesions or rashes.  Neurologic: Alert and oriented x 3.  Psych: Normal affect. Extremities: No clubbing or cyanosis.   HEENT: Normal.   Volume status looks stable, creatinine improved.  FOBT positive and anemic, but he has refused GI workup.  I think that he is ready to leave the acute care hospital, it looks like he is insisting on going home rather than SNF or CIR.  He remains at high risk for readmission due to noncompliance with medications and Bipap.    Meds for home: Torsemide 100 bid Metolazone 2.5 T, TH, Sat, Sun Spiro 25 Potassium (as previously prescribed) Crestor 20  Amio  200 mg bid x 7 days then 200 mg daily Eliquis 5 bid  He will need followup in CHF clinic. Call with further questions.   Loralie Champagne 08/29/2019 12:32 PM

## 2019-08-29 NOTE — Progress Notes (Signed)
Physical Therapy Treatment Patient Details Name: Nicholas Caldwell MRN: [id number] DOB: [birth year] Today's Date: 08/29/2019    History of Present Illness 73 y/o M admitted on 10/17 after suffering a PEA arrest (required 12 min CPR before ROSC).  Recent admit from 10/6-10/12 for RLL PNA, CHF decompensation and COPD exacerbation. CBG's noted to be >400, weight up 4lbs. Pt became somnolent on 10/20 resulting in re-intubation. Pt extubated 10/22 and now on [place].     PT Comments    Patient seen for mobility progression. Pt is making progress toward PT goals. Pt does present with generalized weakness and deconditioning and with c/o dizziness with postural changes and with short distance ambulation. Pt tolerated gait training distance of 84f X 2 trials with seated rest break. Pt on 6L O2 via [place] throughout session. See general comments below for vitals during session. Pt will continue to benefit from further skilled PT services in both acute and post acute settings to maximize independence and safety with mobility prior to d/c home.     Follow Up Recommendations  CIR;Supervision/Assistance - 24 hour     Equipment Recommendations  None recommended by PT    Recommendations for Other Services       Precautions / Restrictions Precautions Precautions: Fall Restrictions Weight Bearing Restrictions: No    Mobility  Bed Mobility Overal bed mobility: Needs Assistance Bed Mobility: Supine to Sit     Supine to sit: Min assist;HOB elevated     General bed mobility comments: assist to elevate trunk into sitting and scoot R hip toward EOB; cues for sequencing and use of rail; HOB elevated and pt reports having adjustable bed at home  Transfers Overall transfer level: Needs assistance Equipment used: Rolling walker (2 wheeled) Transfers: Sit to/from Stand Sit to Stand: Min assist;Min guard         General transfer comment: cues for safe hand placement; pt stood with min guard for safety from  elevated bed height and stood from lower surface (recliner) with min A  Ambulation/Gait Ambulation/Gait assistance: Min assist;+2 safety/equipment;Min guard(chair follow) Gait Distance (Feet): (8 ft X 2 trials with seatd rest break) Assistive device: Rolling walker (2 wheeled) Gait Pattern/deviations: Step-through pattern;Trunk flexed;Shuffle Gait velocity: decreased   General Gait Details: cues for upright posture; pt with decreased step length/height; limited distance due to c/o dizziness    Stairs             Wheelchair Mobility    Modified Rankin (Stroke Patients Only)       Balance Overall balance assessment: Needs assistance Sitting-balance support: Bilateral upper extremity supported;Feet supported Sitting balance-Leahy Scale: Fair     Standing balance support: Bilateral upper extremity supported;During functional activity Standing balance-Leahy Scale: Poor                              Cognition Arousal/Alertness: Awake/alert Behavior During Therapy: WFL for tasks assessed/performed Overall Cognitive Status: History of cognitive impairments - at baseline                                 General Comments: pt pleasant and cooperative, noted decreased problem solving and safety awareness       Exercises      General Comments General comments (skin integrity, edema, etc.): pt on 6L O2 via  and SpO2 remained 90% or > with mobility; Soft BP throughout (supine 111/57 (75),  sitting EOB 98/55 (68), and seated after short distance ambulation 108/59 (75); HR from 70s to 90s       Pertinent Vitals/Pain Pain Assessment: Faces Faces Pain Scale: Hurts a little bit Pain Location: R hip Pain Descriptors / Indicators: Discomfort Pain Intervention(s): Limited activity within patient's tolerance;Monitored during session;Repositioned    Home Living                      Prior Function            PT Goals (current goals can now be  found in the care plan section) Progress towards PT goals: Progressing toward goals    Frequency    Min 2X/week      PT Plan Discharge plan needs to be updated    Co-evaluation PT/OT/SLP Co-Evaluation/Treatment: Yes Reason for Co-Treatment: For patient/therapist safety;To address functional/ADL transfers          AM-PAC PT "6 Clicks" Mobility   Outcome Measure  Help needed turning from your back to your side while in a flat bed without using bedrails?: A Lot Help needed moving from lying on your back to sitting on the side of a flat bed without using bedrails?: A Lot Help needed moving to and from a bed to a chair (including a wheelchair)?: A Little Help needed standing up from a chair using your arms (e.g., wheelchair or bedside chair)?: A Little Help needed to walk in hospital room?: A Lot Help needed climbing 3-5 steps with a railing? : Total 6 Click Score: 13    End of Session Equipment Utilized During Treatment: Gait belt;Oxygen Activity Tolerance: Patient limited by fatigue;Other (comment)(limited by dizziness) Patient left: with call bell/phone within reach;in chair Nurse Communication: Mobility status PT Visit Diagnosis: Other abnormalities of gait and mobility (R26.89)     Time: 5300-5110 PT Time Calculation (min) (ACUTE ONLY): 38 min  Charges:  $Gait Training: 8-22 mins                     Nicholas Caldwell, PTA Acute Rehabilitation Services Pager: [phone] Office: [phone]     Darliss Cheney 08/29/2019, 10:29 AM

## 2019-08-29 NOTE — Progress Notes (Signed)
  Speech Language Pathology Treatment: Dysphagia  Patient Details Name: Nicholas Caldwell MRN: [id number] DOB: Jan 02, 1946 Today's Date: 08/29/2019 Time: 4103-0131 SLP Time Calculation (min) (ACUTE ONLY): 10 min  Assessment / Plan / Recommendation Clinical Impression  Pt has no overt s/s of aspiration with regular solids and thin liquids. He denies any difficulty swallowing or masticating meals (note possible GI symptoms - GI already consulted. Would continue with regular solids and thin liquids as medically appropriate to do so. SLP to sign off acutely.   HPI HPI: 73 y/o M admitted on 10/17 after suffering a PEA arrest (required 12 min CPR before ROSC).  Recent admit from 10/6-10/12 for RLL PNA, CHF decompensation and COPD exacerbation. Pt was initially Intubated less than 24 hours (10/17-10/18) and then he was re-intubated from 10/20-10/22.        SLP Plan  All goals met       Recommendations  Diet recommendations: Regular;Thin liquid Liquids provided via: Cup;Straw Medication Administration: Whole meds with liquid Supervision: Patient able to self feed;Intermittent supervision to cue for compensatory strategies Compensations: Slow rate;Small sips/bites Postural Changes and/or Swallow Maneuvers: Seated upright 90 degrees;Upright 30-60 min after meal                Oral Care Recommendations: Oral care BID Follow up Recommendations: None SLP Visit Diagnosis: Dysphagia, oral phase (R13.11) Plan: All goals met       GO                Venita Sheffield [place] 08/29/2019, 12:05 PM  Pollyann Glen, M.A. [place] Acute Environmental education officer [phone] Office [phone]

## 2019-08-29 NOTE — Care Management (Addendum)
Patient will dc to home. Notified [place]. Patient provided with 30 day Eliquis card.  No other CM needs identified

## 2019-08-29 NOTE — Discharge Instr - Supplementary Instructions (Signed)
Insulin Syringes to be picked up at [facility] [address]

## 2019-08-29 NOTE — Progress Notes (Signed)
Occupational Therapy Treatment Patient Details Name: Nicholas Caldwell MRN: [id number] DOB: September 02, 1946 Today's Date: 08/29/2019    History of present illness 73 y/o M admitted on 10/17 after suffering a PEA arrest (required 12 min CPR before ROSC).  Recent admit from 10/6-10/12 for RLL PNA, CHF decompensation and COPD exacerbation. CBG's noted to be >400, weight up 4lbs. Pt became somnolent on 10/20 resulting in re-intubation. Pt extubated 10/22 and now on [place].    OT comments  Pt making steady progress towards OT goals this session. Session focus on functional mobility and seated ADLs. Pt complete seated grooming in recliner with set- up/ supervision assist. Pt required MIN A +2 for safety for functional mobility from EOB>recliner with RW. Pt with reports of dizziness after functional mobility ( BP checked- see comments for vital signs). D/t pts decreased activity tolerance and overall weakness continue to recommend CIR level therapies to maximize functional independence and return to PLOF. Will continue to follow acutely per POC.    Follow Up Recommendations  CIR;Supervision/Assistance - 24 hour    Equipment Recommendations  None recommended by OT(DME needs are met)    Recommendations for Other Services      Precautions / Restrictions Precautions Precautions: Fall Restrictions Weight Bearing Restrictions: No       Mobility Bed Mobility Overal bed mobility: Needs Assistance Bed Mobility: Supine to Sit     Supine to sit: Min assist;HOB elevated     General bed mobility comments: assist to elevate trunk into sitting and scoot R hip toward EOB; cues for sequencing and use of rail; HOB elevated and pt reports having adjustable bed at home  Transfers Overall transfer level: Needs assistance Equipment used: Rolling walker (2 wheeled) Transfers: Sit to/from Stand Sit to Stand: Min assist;Min guard         General transfer comment: cues for safe hand placement; pt stood with min  guard for safety from elevated bed height and stood from lower surface (recliner) with min A    Balance Overall balance assessment: Needs assistance Sitting-balance support: Bilateral upper extremity supported;Feet supported Sitting balance-Leahy Scale: Fair     Standing balance support: Bilateral upper extremity supported;During functional activity Standing balance-Leahy Scale: Poor                             ADL either performed or assessed with clinical judgement   ADL Overall ADL's : Needs assistance/impaired     Grooming: Wash/dry face;Wash/dry hands;Sitting;Supervision/safety;Set up                   Toilet Transfer: RW;Ambulation;+2 for safety/equipment;Minimal assistance Toilet Transfer Details (indicate cue type and reason): simulated to recliner; MIN A +2 fro safety; reports of dizziness         Functional mobility during ADLs: Minimal assistance;+2 for safety/equipment;Rolling walker General ADL Comments: supervision / set- up for seated ADL; overall pt with decreased activity tolerance requiring rest breaks d/t dizziness and fatigue     Vision   Vision Assessment?: No apparent visual deficits   Perception     Praxis      Cognition Arousal/Alertness: Awake/alert Behavior During Therapy: WFL for tasks assessed/performed Overall Cognitive Status: History of cognitive impairments - at baseline                                 General Comments: pt pleasant and cooperative, noted decreased  problem solving and safety awareness         Exercises     Shoulder Instructions       General Comments pt on 6L via [place] and SpO2 remained withint 90%,  slightly low BP throuhgout, supine- 111/57, sitting EOB- 98/55 and seated after functional mobility 108/59    Pertinent Vitals/ Pain       Pain Assessment: Faces Faces Pain Scale: Hurts a little bit Pain Location: R hip Pain Descriptors / Indicators: Discomfort Pain Intervention(s):  Limited activity within patient's tolerance;Monitored during session;Repositioned  Home Living                                          Prior Functioning/Environment              Frequency  Min 2X/week        Progress Toward Goals  OT Goals(current goals can now be found in the care plan section)  Progress towards OT goals: Progressing toward goals  Acute Rehab OT Goals Patient Stated Goal: to get stronger OT Goal Formulation: With patient Time For Goal Achievement: 09/05/19 Potential to Achieve Goals: Good  Plan Discharge plan remains appropriate    Co-evaluation    PT/OT/SLP Co-Evaluation/Treatment: Yes Reason for Co-Treatment: For patient/therapist safety;To address functional/ADL transfers   OT goals addressed during session: ADL's and self-care      AM-PAC OT "6 Clicks" Daily Activity     Outcome Measure   Help from another person eating meals?: None Help from another person taking care of personal grooming?: A Little Help from another person toileting, which includes using toliet, bedpan, or urinal?: A Lot Help from another person bathing (including washing, rinsing, drying)?: A Lot Help from another person to put on and taking off regular upper body clothing?: A Little Help from another person to put on and taking off regular lower body clothing?: A Lot 6 Click Score: 16    End of Session Equipment Utilized During Treatment: Oxygen;Rolling walker  OT Visit Diagnosis: Other abnormalities of gait and mobility (R26.89);Muscle weakness (generalized) (M62.81);Pain   Activity Tolerance Patient tolerated treatment well   Patient Left in chair;with call bell/phone within reach;with chair alarm set   Nurse Communication          Time: [phone] OT Time Calculation (min): 34 min  Charges: OT General Charges $OT Visit: 1 Visit OT Treatments $Self Care/Home Management : 8-22 mins  [place], [place] [phone] [place] 08/29/2019, 1:47 PM

## 2019-08-29 NOTE — Care Management Important Message (Signed)
Important Message  Patient Details  Name: Nicholas Caldwell MRN: [id number] Date of Birth: 10-18-1946   Medicare Important Message Given:  Yes     Shelda Altes 08/29/2019, 2:00 PM

## 2019-08-29 NOTE — Progress Notes (Signed)
Patient d/c to home with spouse, d/c instructions and scripts given, both verbalize understanding

## 2019-08-29 NOTE — Consult Note (Signed)
Consultation  Referring Provider: Dr. Karleen Hampshire    Primary Care Physician:  [name], MD Primary Gastroenterologist: Althia Forts       Reason for Consultation:   Anemia, FOBT positive         HPI:   Nicholas Caldwell is a 73 y.o. male with a past medical history as listed below including paroxysmal A. fib on Eliquis, COPD on 3 L oxygen, CHF with an EF 40-45%, diabetes, wheelchair-bound and hypertension, who presented to the hospital on 08/20/2019 status post cardiac arrest.  We are consulted today in regards to anemia which is FOBT positive.    Today, the patient explains that he is feeling much better than when he came in, in fact he is hoping to be discharged today.  Explains that he has had a colonoscopy before but he is not sure where (I called around 2 clinics in [place] and no one is aware of him).  Tells me he was having some abdominal pain and distention but this is better now.  He is passing stool and gas.  Denies any GI complaints.  Denies previous knowledge of anemia.    Denies fever, chills, weight loss, blood in his stool or melena.     Hospital course:  -Initially patient with PEA arrest likely due to pain meds and noncompliance with BiPAP, extubated 10/19, - transferred to the floor 10/21 found to be obtunded (had had oxycodone) and unable to protect airway with hypercarbic respiratory failure and was intubated again - extubated 10/22, now on nasal cannula.  On p.o. diuretics, respiratory status stable.  Creatinine improving.   -08/28/2019 CT abdomen pelvis without contrast due to abdominal distention with nausea and vomiting which showed bilateral lower lobe infiltrates with right-sided pleural effusion, increased somewhat in the interval from prior exam, stable left adrenal lesion likely representing an adenoma and other chronic changes.  Past Medical History:  Diagnosis Date   Ex-smoker 11/2017    History reviewed. No pertinent surgical history.  History reviewed. No  pertinent family history.   Social History   Tobacco Use   Smoking status: Former Smoker   Smokeless tobacco: Never Used  Substance Use Topics   Alcohol use: Not on file   Drug use: Not on file    Prior to Admission medications   Medication Sig Start Date End Date Taking? Authorizing Provider  BIDIL 20-37.5 MG tablet Take 1 tablet by mouth daily. 03/31/19  Yes [provider]  bisoprolol (ZEBETA) 5 MG tablet Take 2.5 mg by mouth daily. 03/31/19  Yes [provider]  FEROSUL 325 (65 Fe) MG tablet Take 325 mg by mouth 2 (two) times daily. 03/31/19  Yes [provider]  metolazone (ZAROXOLYN) 2.5 MG tablet Take 2.5 mg by mouth every Tuesday, Thursday, Saturday, and Sunday. 05/18/19  Yes [provider]  oxybutynin (DITROPAN) 5 MG tablet Take 5 mg by mouth 4 (four) times daily. 06/13/19  Yes [provider]  oxyCODONE-acetaminophen (PERCOCET) 10-325 MG tablet Take 1 tablet by mouth 4 (four) times daily -  before meals and at bedtime. 07/25/19  Yes [provider]  potassium chloride SA (KLOR-CON) 20 MEQ tablet Take 60-80 mEq by mouth See admin instructions. Take 60 meq by mouth twice daily all days except on days taking Metolazone take 60 meq in the morning and 80 meq in the evening. 03/31/19  Yes [provider]  rosuvastatin (CRESTOR) 20 MG tablet Take 20 mg by mouth at bedtime. 03/31/19  Yes [provider]  spironolactone (ALDACTONE) 25 MG tablet Take 25 mg by mouth daily. 03/31/19  Yes [provider]  torsemide (DEMADEX) 20 MG tablet Take 100 mg by mouth 2 (two) times daily. 07/05/19  Yes [provider]    Current Facility-Administered Medications  Medication Dose Route Frequency Provider Last Rate Last Dose   0.9 %  sodium chloride infusion   Intravenous PRN Wendee Beavers T, MD 10 mL/hr at 08/26/19 1100     acetaminophen (TYLENOL) tablet 650 mg  650 mg Oral Q4H PRN Hosie Poisson, MD       amiodarone  (PACERONE) tablet 200 mg  200 mg Oral BID Wendee Beavers T, MD   200 mg at 08/28/19 2319   apixaban (ELIQUIS) tablet 5 mg  5 mg Oral BID Wendee Beavers T, MD   5 mg at 08/28/19 2319   arformoterol (BROVANA) nebulizer solution 15 mcg  15 mcg Nebulization BID Wendee Beavers T, MD   15 mcg at 08/29/19 0811   budesonide (PULMICORT) nebulizer solution 0.5 mg  0.5 mg Nebulization BID Wendee Beavers T, MD   0.5 mg at 08/29/19 0811   chlorhexidine (PERIDEX) 0.12 % solution 15 mL  15 mL Mouth Rinse BID Wendee Beavers T, MD   15 mL at 08/28/19 2322   chlorhexidine gluconate (MEDLINE KIT) (PERIDEX) 0.12 % solution 15 mL  15 mL Mouth Rinse BID Wendee Beavers T, MD   15 mL at 08/28/19 2318   Chlorhexidine Gluconate Cloth 2 % PADS 6 each  6 each Topical Daily Wendee Beavers T, MD   6 each at 08/28/19 0830   famotidine (PEPCID) tablet 20 mg  20 mg Oral BID Wendee Beavers T, MD   20 mg at 08/28/19 2319   insulin aspart (novoLOG) injection 0-20 Units  0-20 Units Subcutaneous Q4H Wendee Beavers T, MD   3 Units at 08/29/19 0517   insulin glargine (LANTUS) injection 10 Units  10 Units Subcutaneous QHS Wendee Beavers T, MD   10 Units at 08/28/19 2157   ipratropium-albuterol (DUONEB) 0.5-2.5 (3) MG/3ML nebulizer solution 3 mL  3 mL Nebulization BID Wendee Beavers T, MD   3 mL at 08/29/19 0811   MEDLINE mouth rinse  15 mL Mouth Rinse q12n4p Gonfa, Taye T, MD   15 mL at 08/28/19 1658   metoprolol tartrate (LOPRESSOR) injection 2.5-5 mg  2.5-5 mg Intravenous Q3H PRN Gonfa, Charlesetta Ivory, MD       naloxone (NARCAN) injection 0.4 mg  0.4 mg Intravenous PRN Gonfa, Taye T, MD       polyethylene glycol (MIRALAX / GLYCOLAX) packet 17 g  17 g Oral Daily Wendee Beavers T, MD   17 g at 08/28/19 0904   predniSONE (DELTASONE) tablet 40 mg  40 mg Oral Q breakfast Wendee Beavers T, MD   40 mg at 08/28/19 0924   protein supplement (ENSURE MAX) liquid  11 oz Oral Daily Wendee Beavers T, MD   11 oz at 08/28/19 1109   rosuvastatin (CRESTOR) tablet 20 mg  20 mg Oral QHS  Wendee Beavers T, MD   20 mg at 08/28/19 2319   senna-docusate (Senokot-S) tablet 1 tablet  1 tablet Oral BID PRN Wendee Beavers T, MD   1 tablet at 08/27/19 0932   sodium chloride flush (NS) 0.9 % injection 10-40 mL  10-40 mL Intracatheter Q12H Wendee Beavers T, MD   10 mL at 08/28/19 2323   sodium chloride flush (NS) 0.9 % injection 10-40 mL  10-40 mL  Intracatheter PRN Mercy Riding, MD   10 mL at 08/21/19 0235   torsemide (DEMADEX) tablet 100 mg  100 mg Oral BID Wendee Beavers T, MD   100 mg at 08/28/19 1720   traMADol (ULTRAM) tablet 50 mg  50 mg Oral Q12H PRN Hosie Poisson, MD   50 mg at 08/28/19 2321    Allergies as of 08/20/2019   (No Known Allergies)     Review of Systems:    Constitutional: No weight loss, fever or chills Skin: No rash  Cardiovascular: No chest pain  Respiratory: No SOB Gastrointestinal: See HPI and otherwise negative Genitourinary: No dysuria Neurological: No headache, dizziness or syncope Musculoskeletal: No new muscle or joint pain Hematologic: No bleeding  Psychiatric: No history of depression or anxiety    Physical Exam:  Vital signs in last 24 hours: Temp:  [98.3 F (36.8 C)-98.9 F (37.2 C)] 98.9 F (37.2 C) (10/26 0513) Pulse Rate:  [64-76] 64 (10/26 0513) Resp:  [18-26] 23 (10/26 0811) BP: (107-119)/(49-62) 107/62 (10/26 0513) SpO2:  [98 %-100 %] 98 % (10/26 0811) FiO2 (%):  [40 %-50 %] 50 % (10/26 0811) Weight:  [110.7 kg] 110.7 kg (10/26 0513) Last BM Date: 08/28/19 General:   Pleasant AA male appears to be in NAD, Well developed, Well nourished, alert and cooperative Head:  Normocephalic and atraumatic. Eyes:   PEERL, EOMI. No icterus. Conjunctiva pink. Ears:  Normal auditory acuity. Neck:  Supple Throat: Oral cavity and pharynx without inflammation, swelling or lesion.  Lungs: Respirations even and unlabored. Lungs clear to auscultation bilaterally.   No wheezes, crackles, or rhonchi.  Heart: Normal S1, S2. No MRG. Regular rate and rhythm.  No peripheral edema, cyanosis or pallor.  Abdomen:  Soft, mild distension, nontender. No rebound or guarding. Normal bowel sounds. No appreciable masses or hepatomegaly. Rectal:  Not performed.  Msk:  Symmetrical without gross deformities. Peripheral pulses intact.  Extremities:  Without edema, no deformity or joint abnormality.  Neurologic:  Alert and  oriented x4;  grossly normal neurologically.  Skin:   Dry and intact without significant lesions or rashes. Psychiatric: Demonstrates good judgement and reason without abnormal affect or behaviors.   LAB RESULTS: Recent Labs    08/27/19 0614 08/28/19 0619 08/29/19 0500  WBC 7.6 8.5 8.4  HGB 8.2* 8.4* 8.8*  HCT 28.2* 28.3* 28.9*  PLT 292 304 331   BMET Recent Labs    08/27/19 0614 08/28/19 0619  NA 145 145  K 3.4* 3.1*  CL 100 102  CO2 32 32  GLUCOSE 96 125*  BUN 65* 60*  CREATININE 2.08* 1.63*  CALCIUM 8.8* 8.2*   STUDIES: Ct Abdomen Pelvis Wo Contrast  Result Date: 08/28/2019 CLINICAL DATA:  Abdominal distension with nausea and vomiting EXAM: CT ABDOMEN AND PELVIS WITHOUT CONTRAST TECHNIQUE: Multidetector CT imaging of the abdomen and pelvis was performed following the standard protocol without IV contrast. COMPARISON:  01/02/2019 FINDINGS: Lower chest: Right-sided pleural effusion is noted of a moderate size. Bilateral lower lobe infiltrates are seen. Hepatobiliary: No focal liver abnormality is seen. No gallstones, gallbladder wall thickening, or biliary dilatation. Pancreas: Unremarkable. No pancreatic ductal dilatation or surrounding inflammatory changes. Spleen: Multiple small splenules are noted. Adrenals/Urinary Tract: Right adrenal gland is within normal limits. There is a left adrenal lesion stable from prior exam measuring 2 cm consistent with an adenoma. Kidneys are well visualized bilaterally. No renal calculi or obstructive changes are seen. Ureters are unremarkable. The bladder is partially distended.  Stomach/Bowel: The  appendix is within normal limits. No obstructive or inflammatory changes of the colon are seen. Small bowel and stomach within normal limits. Vascular/Lymphatic: Aortic atherosclerosis. No enlarged abdominal or pelvic lymph nodes. Reproductive: Prostate is unremarkable. Other: No abdominal wall hernia or abnormality. No abdominopelvic ascites. Musculoskeletal: Degenerative changes of the lumbar spine are seen. Left hip replacement is noted. Old healed rib fractures are noted. IMPRESSION: Bilateral lower lobe infiltrates with right-sided pleural effusion. Changes have increased somewhat in the interval from the prior exam. Stable left adrenal lesion likely representing an adenoma. Chronic changes as described above. Electronically Signed   By: Inez Catalina M.D.   On: 08/28/2019 23:03   Dg Abd Portable 1v  Result Date: 08/27/2019 CLINICAL DATA:  Abdominal pain. EXAM: PORTABLE ABDOMEN - 1 VIEW COMPARISON:  CT abdomen pelvis dated January 02, 2019. Abdominal x-ray dated September 06, 2016. FINDINGS: The visualized bowel gas pattern is normal. No radio-opaque calculi or other significant radiographic abnormality are seen. No acute osseous abnormality. Prior left total hip arthroplasty. IMPRESSION: Negative. Electronically Signed   By: Titus Dubin M.D.   On: 08/27/2019 13:04    Impression / Plan:   Impression: 1.  Anemia: Appears somewhat chronic for the patient, hemoglobin seems to have remained stable around 8.8 since 08/24/2019, fecal occult positive on 10/24 and 10/25, CT abdomen and pelvis without contrast as above unrevealing; consider GI source of blood loss versus relation to kidney disease versus other 2.  Recent cardiac arrest status post CPR 08/20/2019 3.  Acute on chronic respiratory failure with hypoxia and hypercapnia: Thought secondary to obstructive sleep apnea, COPD and acute on chronic systolic and diastolic heart failure, noncompliance with BiPAP and iatrogenic, currently  on nasal cannula oxygen 4.  Acute on chronic combined systolic and diastolic heart failure: Echo with an EF of 60-65%, severely dilated and dysfunctional right ventricle, currently diuresed appropriately 5.  Paroxysmal A. fib: Currently on Eliquis 6.  Obstructive sleep apnea: On BiPAP 7.  Acute on chronic CKD stage III: Creatinine improving since admission 8.  Pleural effusion status post thoracentesis: Thought secondary to CHF 9.  Abdominal distention with some discomfort: KUB negative, CT of the abdomen pelvis with no acute gastrointestinal findings  Plan: 1.  Discussed possibility of EGD/colonoscopy with the patient.  At this point he is declining procedures.  Tells me he would like to go home and possibly follow with the [facility] in regards to this if he needs them. 2.  Will discuss above with Dr. Rush Landmark.  Please await any further recommendations from him later today.  Thank you for your kind consultation.  Lavone Nian Tereza Gilham  08/29/2019, 9:35 AM

## 2019-08-29 NOTE — Telephone Encounter (Signed)
Received fax from [facility] stating "patient was scheduled 08/21/2019 for a visit with another RN for recert that didn't work out. 18/56 ended cert. Period. RN called patients wife 10/18 and explained important the visit was and they scheduled it for 10/19 upon arrival no answer,door was locked. No recert visit able to be made

## 2019-08-30 ENCOUNTER — Encounter (HOSPITAL_COMMUNITY): Payer: Self-pay | Admitting: Cardiology

## 2019-08-31 ENCOUNTER — Encounter (HOSPITAL_COMMUNITY): Payer: Non-veteran care

## 2019-09-05 ENCOUNTER — Encounter (HOSPITAL_COMMUNITY): Payer: Medicare Other

## 2019-09-07 NOTE — Discharge Summary (Signed)
Physician Discharge Summary  Nicholas Caldwell KAJ:[id number] DOB: 1946/10/24 DOA: 08/20/2019  PCP: [name], MD  Admit date: 08/20/2019 Discharge date: 08/29/2019  Admitted From: [facility].  Disposition:  Home.   Recommendations for Outpatient Follow-up:  1. Follow up with PCP in 1-2 weeks 2. Please obtain BMP/CBC in one week 3. Please follow up with cardiology and gastroenterology as recommended.   Home Health: yes.  Equipment/Devices:oxygen, BIPAP.   Discharge Condition:guarded.  CODE STATUS:full code.  Diet recommendation: Heart Healthy  Brief/Interim Summary: 73 year old gentleman with prior history of COPD, chronic respiratory failure on 3 L of nasal cannula at home, obstructive sleep apnea, noncompliant with BiPAP, paroxysmal atrial fibrillation on Eliquis, chronic systolic and diastolic heart failure s/p AICD, chronic pain syndrome, essential hypertension, chronic venous insufficiency admitted post cardiac arrest/ROSC after 12 minutes.  He was admitted to ICU post cardiac arrest for acute on chronic respiratory failure secondary to probably COPD and CHF exacerbation he was intubated on the 17th and extubated on 18 October.  He was reintubated 20 October and extubated the next day.  He was transferred to [facility] service on 08/26/2019. Patient is on BiPAP at night and nasal cannula oxygen during the day.  Discharge Diagnoses:  Active Problems:   Cardiac arrest ([place])   Pressure injury of skin   Acute respiratory failure with hypercapnia (HCC)   Encounter for intubation   Acute on chronic respiratory failure with hypoxia and hypercapnia probably secondary to obstructive sleep apnea, COPD and acute on chronic systolic and diastolic heart failure, noncompliance with BiPAP and iatrogenic.(Probably from narcotic pain medications) COVID-19 screening test is negative. Cardiology/heart failure on board and appreciate their recommendations. Patient currently on nasal cannula oxygen and home  BiPAP at night.    Acute on chronic combined systolic and diastolic heart failure. Last echocardiogram showed EF 60 to 65%, severely dilated and dysfunctional RV. Diuresed appropriately plan to discharge on 100 mg of torsemide twice daily along with spironolactone 25 mg daily.     Paroxysmal atrial fibrillation S/p Shirley V, last date 2020.  Currently sinus rhythm on amiodarone.  Continue with apixaban 5 mg twice daily.  Obstructive sleep apnea Continue with BiPAP at night.    Coronary artery disease : nonobstructive. Patient denies any chest pain at this time continue with Crestor 20 mg daily.    Acute on stage III CKD Improving creatinine at 1.6 today    Pleural effusion s/p thoracentesis on the right probably secondary to acute CHF     Hypokalemia Replaced, probably from diuretics    Hyperkalemia Continue with statin    Abdominal distention with some discomfort KUB not impressive. Patient reports persistent abdominal discomfort and not able to have splatters. CT of the abdomen without contrast ordered for further evaluation, does not show any acute intra abdominal pathology for his abd discomfort.  On the day of discharge his pain resolved after BM.     Stage II right sacral and buttocks pressure injury Present on admission   Diabetes mellitus Hemoglobin A1c of 7.4 resume home lantus.    Anemia of chronic disease Anemia panel reviewed. Fecal occult blood is positive We do not know his baseline hemoglobin, currently stable around 8.  GI consulted, and he will need EGD / colonoscopy. Pt would liek to follow up at [facility] clinic for the above.     Coag negative staph in blood cultures:  Contaminant.     Copd Exacerbation:  Improving.  No wheezing heard today. Continue with brovana and duonebs.  BIPAP at night.   Acute metabolic encephalopathy:  Resolved.    Discharge Instructions  Discharge  Instructions    Diet - low sodium heart healthy   Complete by: As directed    Increase activity slowly   Complete by: As directed      Allergies as of 08/29/2019   No Known Allergies     Medication List    STOP taking these medications   BiDil 20-37.5 MG tablet Generic drug: isosorbide-hydrALAZINE   bisoprolol 5 MG tablet Commonly known as: ZEBETA   oxyCODONE-acetaminophen 10-325 MG tablet Commonly known as: PERCOCET     TAKE these medications   amiodarone 200 MG tablet Commonly known as: PACERONE Amiodarone 200 mg BID for one week followed by  Amiodarone 200 mg daily .   apixaban 5 MG Tabs tablet Commonly known as: ELIQUIS Take 1 tablet (5 mg total) by mouth 2 (two) times daily.   Ensure Max Protein Liqd Take 330 mLs (11 oz total) by mouth daily.   famotidine 20 MG tablet Commonly known as: PEPCID Take 1 tablet (20 mg total) by mouth 2 (two) times daily.   FeroSul 325 (65 FE) MG tablet Generic drug: ferrous sulfate Take 325 mg by mouth 2 (two) times daily.   Insulin Syringe 27G X 1/2" 0.5 ML Misc 100 Syringes by Does not apply route 3 (three) times daily.   ipratropium-albuterol 0.5-2.5 (3) MG/3ML Soln Commonly known as: DUONEB Take 3 mLs by nebulization every 6 (six) hours as needed.   metolazone 2.5 MG tablet Commonly known as: ZAROXOLYN Take 2.5 mg by mouth every Tuesday, Thursday, Saturday, and Sunday.   oxybutynin 5 MG tablet Commonly known as: DITROPAN Take 5 mg by mouth 4 (four) times daily.   polyethylene glycol 17 g packet Commonly known as: MIRALAX / GLYCOLAX Take 17 g by mouth daily as needed.   potassium chloride SA 20 MEQ tablet Commonly known as: KLOR-CON Take 60-80 mEq by mouth See admin instructions. Take 60 meq by mouth twice daily all days except on days taking Metolazone take 60 meq in the morning and 80 meq in the evening.   predniSONE 20 MG tablet Commonly known as: DELTASONE Take 1 tablet (20 mg total) by mouth daily with  breakfast.   rosuvastatin 20 MG tablet Commonly known as: CRESTOR Take 20 mg by mouth at bedtime.   senna-docusate 8.6-50 MG tablet Commonly known as: Senokot-S Take 1 tablet by mouth at bedtime as needed for mild constipation.   spironolactone 25 MG tablet Commonly known as: ALDACTONE Take 25 mg by mouth daily.   torsemide 20 MG tablet Commonly known as: DEMADEX Take 100 mg by mouth 2 (two) times daily.      Follow-up Information    [facility] Follow up on 09/05/2019.   Specialty: Cardiology Why: 2:30 PM Garage Parking Code [phone]  Contact information: [address] [phone] [place], [place] Follow up.   Specialty: Home Health Services Why: Registered Nurse, Physical Therapy, Aide.        Mulles, Corazon, MD. Schedule an appointment as soon as possible for a visit in 1 week(s).   Specialty: Internal Medicine Contact information: [place] [address] [phone]          No Known Allergies  Consultations:  Cardiology  Gastroenterology.    Procedures/Studies: Ct Abdomen Pelvis Wo Contrast  Result Date: 08/28/2019 CLINICAL  DATA:  Abdominal distension with nausea and vomiting EXAM: CT ABDOMEN AND PELVIS WITHOUT CONTRAST TECHNIQUE: Multidetector CT imaging of the abdomen and pelvis was performed following the standard protocol without IV contrast. COMPARISON:  01/02/2019 FINDINGS: Lower chest: Right-sided pleural effusion is noted of a moderate size. Bilateral lower lobe infiltrates are seen. Hepatobiliary: No focal liver abnormality is seen. No gallstones, gallbladder wall thickening, or biliary dilatation. Pancreas: Unremarkable. No pancreatic ductal dilatation or surrounding inflammatory changes. Spleen: Multiple small splenules are noted. Adrenals/Urinary Tract: Right adrenal gland is within normal limits. There is a  left adrenal lesion stable from prior exam measuring 2 cm consistent with an adenoma. Kidneys are well visualized bilaterally. No renal calculi or obstructive changes are seen. Ureters are unremarkable. The bladder is partially distended. Stomach/Bowel: The appendix is within normal limits. No obstructive or inflammatory changes of the colon are seen. Small bowel and stomach within normal limits. Vascular/Lymphatic: Aortic atherosclerosis. No enlarged abdominal or pelvic lymph nodes. Reproductive: Prostate is unremarkable. Other: No abdominal wall hernia or abnormality. No abdominopelvic ascites. Musculoskeletal: Degenerative changes of the lumbar spine are seen. Left hip replacement is noted. Old healed rib fractures are noted. IMPRESSION: Bilateral lower lobe infiltrates with right-sided pleural effusion. Changes have increased somewhat in the interval from the prior exam. Stable left adrenal lesion likely representing an adenoma. Chronic changes as described above. Electronically Signed   By: Inez Catalina M.D.   On: 08/28/2019 23:03   Dg Chest 1 View  Result Date: 08/13/2019 CLINICAL DATA:  Status post thoracentesis. EXAM: CHEST  1 VIEW COMPARISON:  Same day. FINDINGS: Stable cardiomegaly. No pneumothorax is noted. Stable bilateral lung opacities are noted concerning for edema or pneumonia. Grossly stable bilateral pleural effusions are noted. Bony thorax is unremarkable. IMPRESSION: Stable bilateral lung opacities and pleural effusions as described above. No pneumothorax is noted status post thoracentesis. Electronically Signed   By: Marijo Conception M.D.   On: 08/13/2019 10:58   Ct Head Wo Contrast  Result Date: 08/23/2019 CLINICAL DATA:  Altered LOC EXAM: CT HEAD WITHOUT CONTRAST TECHNIQUE: Contiguous axial images were obtained from the base of the skull through the vertex without intravenous contrast. COMPARISON:  CT brain 08/21/2019 FINDINGS: Brain: No acute territorial infarction, hemorrhage or  intracranial mass. Chronic infarcts in the right-greater-than-left cerebellum. Mild small vessel ischemic changes of the white matter. Stable ventricle size. Vascular: No hyperdense vessels.  Carotid vascular calcification Skull: Normal. Negative for fracture or focal lesion. Sinuses/Orbits: No acute finding. Other: None IMPRESSION: 1. No CT evidence for acute intracranial abnormality. 2. Mild small vessel ischemic changes of the white matter. Chronic cerebellar infarcts Electronically Signed   By: Donavan Foil M.D.   On: 08/23/2019 23:25   Ct Head Wo Contrast  Result Date: 08/21/2019 CLINICAL DATA:  Initial evaluation for acute encephalopathy. EXAM: CT HEAD WITHOUT CONTRAST TECHNIQUE: Contiguous axial images were obtained from the base of the skull through the vertex without intravenous contrast. COMPARISON:  Prior CT from 04/09/2019. FINDINGS: Brain: Cerebral volume within normal limits for age. Mild chronic microvascular ischemic changes present within the periventricular white matter, stable. Few scatter remote bilateral cerebellar infarcts, also unchanged. No acute intracranial hemorrhage. No acute large vessel territory infarct. No mass lesion, midline shift or mass effect. No hydrocephalus. No extra-axial fluid collection. Vascular: No hyperdense vessel. Scattered vascular calcifications noted within the carotid siphons. Skull: Scalp soft tissues and calvarium within normal limits. Sinuses/Orbits: Globes and orbital soft tissues within normal limits. Patient intubated with endotracheal and  enteric tubes partially visualized. Scattered mucosal thickening noted within the ethmoidal air cells. No mastoid effusion. Other: None. IMPRESSION: 1. No acute intracranial abnormality. 2. Stable mild chronic small vessel ischemic disease with few scattered remote bilateral cerebellar infarcts. Electronically Signed   By: Jeannine Boga M.D.   On: 08/21/2019 02:07   Ct Angio Chest Pe W Or Wo  Contrast  Result Date: 08/10/2019 CLINICAL DATA:  Shortness of breath. EXAM: CT ANGIOGRAPHY CHEST WITH CONTRAST TECHNIQUE: Multidetector CT imaging of the chest was performed using the standard protocol during bolus administration of intravenous contrast. Multiplanar CT image reconstructions and MIPs were obtained to evaluate the vascular anatomy. CONTRAST:  31m OMNIPAQUE IOHEXOL 350 MG/ML SOLN COMPARISON:  Chest x-ray from same day. CT chest dated January 03, 2019. FINDINGS: Cardiovascular: Suboptimal opacification of the pulmonary arteries. No definite central or lobar pulmonary embolism. Unchanged dilatation of the main pulmonary artery measuring up to 4.0 cm. Unchanged cardiomegaly with prominent right heart enlargement. No pericardial effusion. No thoracic aortic aneurysm or dissection. Coronary, aortic arch, and branch vessel atherosclerotic vascular disease. Mediastinum/Nodes: Borderline and mildly enlarged mediastinal and right hilar lymph nodes are similar to prior study and likely reactive. No enlarged axillary lymph nodes. The thyroid gland, trachea, and esophagus demonstrate no significant findings. Lungs/Pleura: Increased now large right pleural effusion with complete collapse of the right lower lobe. Partial collapse of the left lower lobe. Dependent atelectasis in both upper lobes. No consolidation or pneumothorax. Unchanged centrilobular and paraseptal emphysema. Mild interlobular septal thickening. Scattered bilateral pulmonary nodules measuring up to 8 mm are unchanged. Upper Abdomen: No acute abnormality. Musculoskeletal: No chest wall abnormality. No acute or significant osseous findings. Review of the MIP images confirms the above findings. IMPRESSION: 1. Suboptimal opacification of the pulmonary arteries. No definite central or lobar pulmonary embolism. 2. Increased now large right pleural effusion with complete collapse of the right lower lobe. Progressive partial collapse of the left lower  lobe. 3. Unchanged cardiomegaly with evidence of pulmonary arterial hypertension and mild interstitial pulmonary edema. 4. Scattered bilateral pulmonary nodules measuring up to 8 mm are unchanged since March 2020. Non-contrast chest CT at 12-18 months (from today's scan) is considered optional for low-risk patients, but is recommended for high-risk patients. This recommendation follows the consensus statement: Guidelines for Management of Incidental Pulmonary Nodules Detected on CT Images: From the Fleischner Society 2017; Radiology 2017; 284:228-243. 5.  Emphysema (ICD10-J43.9). 6.  Aortic atherosclerosis (ICD10-I70.0). Electronically Signed   By: WTitus DubinM.D.   On: 08/10/2019 00:11   Dg Chest Port 1 View  Result Date: 08/26/2019 CLINICAL DATA:  Interval extubation EXAM: PORTABLE CHEST 1 VIEW COMPARISON:  08/25/2019 FINDINGS: Cardiac shadow is stable. Aortic calcifications are again seen. Right jugular catheter is again noted. Endotracheal tube and gastric catheter have been removed in the interval. Bilateral pleural effusions and patchy bilateral pulmonary opacities are stable. Vascular congestion is seen. No bony abnormality is noted. IMPRESSION: Stable vascular congestion and bilateral effusions. Stable bilateral opacities likely related to edema are seen. Electronically Signed   By: MInez CatalinaM.D.   On: 08/26/2019 07:02   Dg Chest Port 1 View  Result Date: 08/25/2019 CLINICAL DATA:  Respiratory failure. Endotracheal tube present. EXAM: PORTABLE CHEST 1 VIEW COMPARISON:  Radiograph 08/23/2019 CT 08/09/2019 FINDINGS: Endotracheal tube tip at the thoracic inlet. Enteric tube tip and side-port below the diaphragm not included in the field of view. Right internal jugular central line in the upper SVC. Cardiomegaly is unchanged. Bilateral pulmonary  opacities and pleural effusions, not significantly changed yesterday. No visualized pneumothorax. IMPRESSION: 1. Unchanged bilateral pulmonary  opacities which may represent pulmonary edema, pneumonia, or ARDS. Bilateral pleural effusions. Unchanged cardiomegaly. 2. Stable support apparatus. Electronically Signed   By: Keith Rake M.D.   On: 08/25/2019 06:30   Dg Chest Port 1 View  Result Date: 08/23/2019 CLINICAL DATA:  Post intubation EXAM: PORTABLE CHEST 1 VIEW COMPARISON:  August 22, 2019 FINDINGS: ETT is 2 cm above the carina. NG tube is below the diaphragm. A right-sided central venous catheter seen with the tip at the superior cavoatrial junction. Slight interval worsening in the hazy airspace opacity in both lungs. There is blunting of the left costophrenic angle, likely small effusion. The cardiomediastinal silhouette is unchanged. IMPRESSION: ET tube 2 cm above the carina. NG tube below the diaphragm Slight interval worsening in the hazy airspace opacity in both lung, which could be due to worsening edema and/or developing ARDS. Probable small left pleural effusion Electronically Signed   By: Prudencio Pair M.D.   On: 08/23/2019 21:34   Dg Chest Port 1 View  Result Date: 08/22/2019 CLINICAL DATA:  Respiratory failure EXAM: PORTABLE CHEST 1 VIEW COMPARISON:  Yesterday FINDINGS: Right IJ line with tip at the SVC. Hazy appearance of the bilateral chest from layering pleural effusions and atelectasis by CT. Pulmonary vascular congestion that has likely improved. Marked cardiomegaly. No pneumothorax. IMPRESSION: Extubation with unchanged hazy appearance of the chest from layering effusions and atelectasis based on most recent chest CT. Vascular congestion which has likely improved. Electronically Signed   By: Monte Fantasia M.D.   On: 08/22/2019 06:07   Dg Chest Port 1 View  Result Date: 08/21/2019 CLINICAL DATA:  Central line placement EXAM: PORTABLE CHEST 1 VIEW COMPARISON:  August 20, 2019 FINDINGS: Endotracheal tube tip is 2.5 cm above the carina. NG tube is seen coursing below the diaphragm. A right-sided central venous  catheter is seen with the tip left the distal SVC. There is hazy airspace opacity with interstitial markings seen throughout both lungs. Bilateral pleural effusions are seen, right greater than left. The cardiomediastinal silhouette is partially obscured. IMPRESSION: 1. Endotracheal tube tip 2.5 cm above the carina. 2. NG tube below the diaphragm 3. Right-sided central venous catheter with the tip in the distal SVC. 4. Unchanged hazy/interstitial opacities throughout both lungs, likely pulmonary edema and/or infectious etiology. 5. Bilateral pleural effusions, right greater than left. Electronically Signed   By: Prudencio Pair M.D.   On: 08/21/2019 01:33   Dg Chest Portable 1 View  Result Date: 08/20/2019 CLINICAL DATA:  73 year old male status post intubation EXAM: PORTABLE CHEST 1 VIEW COMPARISON:  Chest radiograph dated 08/15/2019 FINDINGS: Endotracheal tube with tip approximately 4 cm above the carina. An enteric tube extends below the diaphragm with tip beyond the inferior margin of the image. Interval worsening of bilateral interstitial and airspace density compared to radiograph of 08/15/2019 and development of bilateral pleural effusions, right greater left. Findings most consistent with worsening pulmonary edema or CHF although pneumonia is not excluded. Clinical correlation is recommended. There is no pneumothorax. The cardiac borders are silhouetted. No acute osseous pathology. IMPRESSION: 1. Endotracheal tube above the carina. 2. Significant interval worsening of the bilateral interstitial prominence and airspace opacities and development of bilateral pleural effusions, right greater left. Findings most consistent with worsening pulmonary edema or pneumonia. Clinical correlation and follow-up recommended. Electronically Signed   By: Anner Crete M.D.   On: 08/20/2019 22:44   Dg  Chest Port 1 View  Result Date: 08/15/2019 CLINICAL DATA:  Pleural effusion. EXAM: PORTABLE CHEST 1 VIEW COMPARISON:   August 14, 2019. FINDINGS: Stable cardiomegaly with central pulmonary vascular congestion. No pneumothorax is noted. Stable left perihilar and basilar opacity is noted concerning for atelectasis or infiltrate with associated pleural effusion. Increased right basilar atelectasis or infiltrate is noted. Bony thorax is unremarkable. IMPRESSION: Stable cardiomegaly with central pulmonary vascular congestion. Stable left basilar opacity and effusion as described above. Increased right basilar atelectasis or infiltrate is noted. Electronically Signed   By: Marijo Conception M.D.   On: 08/15/2019 07:41   Dg Chest Port 1 View  Result Date: 08/14/2019 CLINICAL DATA:  Pleural effusion Update status EXAM: PORTABLE CHEST - 1 VIEW COMPARISON:  the previous day's study FINDINGS: Stable left pleural effusion. Stable consolidation/atelectasis at the left base. Bilateral interstitial edema/infiltrates may be marginally improved. Heart size within normal limits for portable technique. Aortic Atherosclerosis (ICD10-170.0). No pneumothorax. Bilateral cervical ribs incidentally noted. 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 IMPRESSION: 1. Stable left pleural effusion and left lower lobe consolidation/atelectasis. 2. Bilateral interstitial edema/infiltrates may be marginally improved. Electronically Signed   By: Lucrezia Europe M.D.   On: 08/14/2019 08:08   Dg Chest Port 1 View  Result Date: 08/13/2019 CLINICAL DATA:  Respiratory failure. EXAM: PORTABLE CHEST 1 VIEW COMPARISON:  Radiograph of August 11, 2019. FINDINGS: Stable cardiomegaly. Stable bilateral lung opacities are noted concerning for edema or pneumonia. Bilateral pleural effusions are noted. No pneumothorax is noted. Bony thorax is unremarkable. IMPRESSION: Stable bilateral lung opacities as described above. Electronically Signed   By: Marijo Conception M.D.   On: 08/13/2019 09:44   Dg Chest Port 1 View  Result Date: 08/11/2019 CLINICAL DATA:  Respiratory failure EXAM: PORTABLE  CHEST 1 VIEW COMPARISON:  08/09/2019 FINDINGS: Cardiac enlargement. Severe diffuse bilateral airspace disease without significant change. Extensive bibasilar consolidation. Moderate right pleural effusion. IMPRESSION: Diffuse bilateral airspace disease with little change from 2 days ago. Probable congestive heart failure. Superimposed pneumonia not excluded. Electronically Signed   By: Franchot Gallo M.D.   On: 08/11/2019 08:24   Dg Chest Port 1 View  Result Date: 08/09/2019 CLINICAL DATA:  Shortness of breath EXAM: PORTABLE CHEST 1 VIEW COMPARISON:  July 18, 2019 FINDINGS: The heart size is enlarged. There is pulmonary edema. Consolidation of bilateral lung bases with bilateral pleural effusions are noted. Bony structures are stable. IMPRESSION: Congestive heart failure. Patchy consolidation of bilateral lung bases, superimposed pneumonia is not excluded. Bilateral pleural effusions. Electronically Signed   By: Abelardo Diesel M.D.   On: 08/09/2019 15:12   Dg Abd Portable 1v  Result Date: 08/27/2019 CLINICAL DATA:  Abdominal pain. EXAM: PORTABLE ABDOMEN - 1 VIEW COMPARISON:  CT abdomen pelvis dated January 02, 2019. Abdominal x-ray dated September 06, 2016. FINDINGS: The visualized bowel gas pattern is normal. No radio-opaque calculi or other significant radiographic abnormality are seen. No acute osseous abnormality. Prior left total hip arthroplasty. IMPRESSION: Negative. Electronically Signed   By: Titus Dubin M.D.   On: 08/27/2019 13:04   US Thoracentesis Asp Pleural Space W/img Guide  Result Date: 08/14/2019 INDICATION: Patient with history of chronic respiratory failure, recurrent pneumonia, right pleural effusion. Request is made for diagnostic and therapeutic thoracentesis. EXAM: ULTRASOUND GUIDED DIAGNOSTIC AND THERAPEUTIC RIGHT THORACENTESIS MEDICATIONS: 10 mL 1% lidocaine COMPLICATIONS: None immediate. PROCEDURE: An ultrasound guided thoracentesis was thoroughly discussed with the patient  and questions answered. The benefits, risks, alternatives and complications were also  discussed. The patient understands and wishes to proceed with the procedure. Written consent was obtained. Ultrasound was performed to localize and mark an adequate pocket of fluid in the right chest. The area was then prepped and draped in the normal sterile fashion. 1% Lidocaine was used for local anesthesia. Under ultrasound guidance a 6 Fr Safe-T-Centesis catheter was introduced. Thoracentesis was performed. The catheter was removed and a dressing applied. FINDINGS: A total of approximately 1.0 liters of cloudy, pink fluid was removed. Samples were sent to the laboratory as requested by the clinical team. IMPRESSION: Successful ultrasound guided diagnostic and therapeutic right thoracentesis yielding 1.0 liters of pleural fluid. Read by: Brynda Greathouse PA-C No pneumothorax on follow-up radiograph. Electronically Signed   By: Lucrezia Europe M.D.   On: 08/14/2019 11:11       Subjective: No new complaints.   Discharge Exam: Vitals:   08/29/19 1217 08/29/19 1300  BP: 122/65   Pulse: 73   Resp:  (!) 22  Temp: 98 F (36.7 C)   SpO2:  96%   Vitals:   08/29/19 0811 08/29/19 1200 08/29/19 1217 08/29/19 1300  BP:   122/65   Pulse:   73   Resp: (!) 23 (!) 23  (!) 22  Temp:   98 F (36.7 C)   TempSrc:   Oral   SpO2: 98% 96%  96%  Weight:      Height:        General: Pt is alert, awake, not in acute distress Cardiovascular: RRR, S1/S2 +,  Respiratory: CTA bilaterally, no wheezing, no rhonchi Abdominal: Soft, NT, ND, bowel sounds + Extremities: bilateral leg edema.     The results of significant diagnostics from this hospitalization (including imaging, microbiology, ancillary and laboratory) are listed below for reference.     Microbiology: No results found for this or any previous visit (from the past 240 hour(s)).   Labs: BNP (last 3 results) Recent Labs    08/09/19 1830 08/12/19 1017  08/20/19 2210  BNP 2,778.5* 2,708.1* 6,962.9*   Basic Metabolic Panel: No results for input(s): NA, K, CL, CO2, GLUCOSE, BUN, CREATININE, CALCIUM, MG, PHOS in the last 168 hours. Liver Function Tests: No results for input(s): AST, ALT, ALKPHOS, BILITOT, PROT, ALBUMIN in the last 168 hours. No results for input(s): LIPASE, AMYLASE in the last 168 hours. No results for input(s): AMMONIA in the last 168 hours. CBC: No results for input(s): WBC, NEUTROABS, HGB, HCT, MCV, PLT in the last 168 hours. Cardiac Enzymes: No results for input(s): CKTOTAL, CKMB, CKMBINDEX, TROPONINI in the last 168 hours. BNP: Invalid input(s): POCBNP CBG: No results for input(s): GLUCAP in the last 168 hours. D-Dimer No results for input(s): DDIMER in the last 72 hours. Hgb A1c No results for input(s): HGBA1C in the last 72 hours. Lipid Profile No results for input(s): CHOL, HDL, LDLCALC, TRIG, CHOLHDL, LDLDIRECT in the last 72 hours. Thyroid function studies No results for input(s): TSH, T4TOTAL, T3FREE, THYROIDAB in the last 72 hours.  Invalid input(s): FREET3 Anemia work up No results for input(s): VITAMINB12, FOLATE, FERRITIN, TIBC, IRON, RETICCTPCT in the last 72 hours. Urinalysis    Component Value Date/Time   COLORURINE STRAW (A) 07/08/2019 0008   APPEARANCEUR CLEAR 07/08/2019 0008   LABSPEC 1.006 07/08/2019 0008   PHURINE 6.0 07/08/2019 0008   GLUCOSEU NEGATIVE 07/08/2019 0008   HGBUR NEGATIVE 07/08/2019 0008   BILIRUBINUR NEGATIVE 07/08/2019 0008   KETONESUR NEGATIVE 07/08/2019 0008   PROTEINUR NEGATIVE 07/08/2019 0008   UROBILINOGEN  0.2 10/26/2014 2206   NITRITE NEGATIVE 07/08/2019 0008   LEUKOCYTESUR NEGATIVE 07/08/2019 0008   Sepsis Labs Invalid input(s): PROCALCITONIN,  WBC,  LACTICIDVEN Microbiology No results found for this or any previous visit (from the past 240 hour(s)).   Time coordinating discharge: 32 minutes  SIGNED:   Hosie Poisson, MD  [facility] 09/07/2019, 7:51 AM Pager   If 7PM-7AM, please contact night-coverage [email or web address] Password [email or web address]

## 2019-09-08 ENCOUNTER — Telehealth (HOSPITAL_COMMUNITY): Payer: Self-pay

## 2019-09-08 ENCOUNTER — Inpatient Hospital Stay (HOSPITAL_COMMUNITY): Admission: RE | Admit: 2019-09-08 | Payer: No Typology Code available for payment source | Source: Ambulatory Visit

## 2019-09-08 NOTE — Telephone Encounter (Signed)
Pt had cancelled appoint today. I have not been able to see him since d/c. He was suppose to have came on Monday to clinic as well but cancelled as he had this appointment today but cancelled again.  I called them but no answer and LVM for a return call.   Marylouise Stacks, EMT-Paramedic  09/08/19

## 2019-09-08 NOTE — Telephone Encounter (Signed)
pts wife called me back and stated pt wanted to cancel appointment for today, she said he was feeling alright---not too sure about that answer though.  I asked if I could come out this afternoon to see him and she replied that she was fixing to head out the door and today is not a good day. I advised her that Monday would be the next day I could come out. She agreed for a visit on Monday for him. Will see him then.   Marylouise Stacks, EMT-Paramedic  09/08/19

## 2019-09-09 ENCOUNTER — Telehealth (HOSPITAL_COMMUNITY): Payer: Self-pay | Admitting: Cardiology

## 2019-09-09 NOTE — Telephone Encounter (Signed)
Verbal order given for resumption of care for physical therapy [facility] [phone]

## 2019-09-12 ENCOUNTER — Encounter (HOSPITAL_COMMUNITY): Payer: No Typology Code available for payment source

## 2019-09-12 ENCOUNTER — Other Ambulatory Visit (HOSPITAL_COMMUNITY): Payer: Self-pay

## 2019-09-12 NOTE — Progress Notes (Signed)
Paramedicine Encounter    Patient ID: Nicholas Caldwell, male    DOB: 1946/09/09, 73 y.o.   MRN: [id number]   Patient Care Team: [name], MD as PCP - General (Internal Medicine) Jettie Booze, MD as Consulting Physician (Cardiology) Sharmon Revere as Physician Assistant (Cardiology) [name], MD as Attending Physician (Internal Medicine) Thompson Grayer, MD as Consulting Physician (Clinical Cardiac Electrophysiology) [name], MD (Internal Medicine)  Patient Active Problem List   Diagnosis Date Noted  . Acute respiratory failure with hypercapnia ([place])   . Encounter for intubation   . Pressure injury of skin 08/21/2019  . Cardiac arrest ([place]) 08/20/2019  . Palliative care by specialist   . DNR (do not resuscitate) discussion   . Fatigue   . Acute CHF (congestive heart failure) ([place]) 07/07/2019  . Acute on chronic heart failure ([place]) 06/09/2019  . Lobar pneumonia ([place]) 04/14/2019  . Hyperkalemia 04/10/2019  . Cerebral embolism with cerebral infarction 03/21/2019  . Gastritis and gastroduodenitis   . NSVT (nonsustained ventricular tachycardia) ([place]) 03/08/2019  . Tobacco abuse counseling 03/08/2019  . Iron deficiency anemia   . Acute on chronic systolic CHF (congestive heart failure) ([place]) 03/06/2019  . Diabetes mellitus type 2 in obese ([place])   . Primary osteoarthritis of right hip   . Hypomagnesemia   . Steroid-induced hyperglycemia   . Supplemental oxygen dependent   . Leukocytosis   . Hypokalemia   . Acute on chronic anemia   . Diabetic peripheral neuropathy ([place])   . Acute on chronic systolic (congestive) heart failure ([place])   . Acute on chronic respiratory failure ([place]) 01/14/2019  . Hypoxia   . Altered mental status   . Heart failure ([place]) 12/30/2018  . AKI (acute kidney injury) ([place])   . Acute respiratory failure ([place]) 11/22/2018  . Acute on chronic respiratory failure with hypoxia and hypercapnia ([place]) 11/13/2018  . Metabolic  encephalopathy 99/37/1696  . Acute respiratory failure with hypoxia and hypercapnia ([place]) 07/26/2018  . Acute exacerbation of CHF (congestive heart failure) ([place]) 07/26/2018  . CHF exacerbation ([place]) 05/01/2018  . CHF (congestive heart failure) ([place]) 04/30/2018  . Chronic respiratory failure with hypoxia ([place]) 12/28/2017  . Acute encephalopathy   . Atrial fibrillation with RVR ([place])   . SVT (supraventricular tachycardia) ([place])   . COPD GOLD 0   . Medically noncompliant   . Panlobular emphysema ([place])   . OSA (obstructive sleep apnea)   . Pulmonary hypertension ([place])   . Disorientation   . Pressure injury of skin 09/07/2016  . Acute on chronic combined systolic and diastolic CHF (congestive heart failure) ([place]) 09/05/2016  . Skin lesion-left heal 09/05/2016  . Chronic systolic CHF (congestive heart failure) ([place]) 07/16/2016  . COPD (chronic obstructive pulmonary disease) ([place]) 07/16/2016  . Uncontrolled type 2 diabetes mellitus with complication ([place])   . Diabetic polyneuropathy associated with diabetes mellitus due to underlying condition ([place])   . Normocytic anemia 06/29/2016  . CAD - Non-obstructive by LHC1/16 01/21/2016  . Prolonged QT interval 10/09/2015  . Nonischemic cardiomyopathy ([place]) 10/09/2015  . PAF (paroxysmal atrial fibrillation) ([place])   . Chronic pain 02/19/2015  . Morbid obesity ([place]) 02/13/2015  . Dyspnea   . Elevated troponin I level 11/01/2014  . COPD exacerbation ([place]) 11/01/2014  . Essential hypertension 10/31/2014  . Type 2 diabetes mellitus with neuropathy 10/31/2014  . Hyperlipidemia  10/31/2014  . Cigarette smoker 10/31/2014    Current Outpatient Medications:  .  albuterol (VENTOLIN HFA) 108 (90 Base)  MCG/ACT inhaler, Inhale 2 puffs into the lungs every 6 (six) hours as needed for wheezing or shortness of breath., Disp: , Rfl:  .  amiodarone (PACERONE) 200 MG tablet, Amiodarone 200 mg BID for one week followed by  Amiodarone 200 mg daily ., Disp: 30  tablet, Rfl: 0 .  apixaban (ELIQUIS) 5 MG TABS tablet, Take 1 tablet (5 mg total) by mouth 2 (two) times daily., Disp: 60 tablet, Rfl: 0 .  apixaban (ELIQUIS) 5 MG TABS tablet, Take 1 tablet (5 mg total) by mouth 2 (two) times daily., Disp: 60 tablet, Rfl: 1 .  bisoprolol (ZEBETA) 5 MG tablet, Take 5 mg by mouth at bedtime., Disp: , Rfl:  .  budesonide-formoterol (SYMBICORT) 160-4.5 MCG/ACT inhaler, Inhale 2 puffs into the lungs as needed (shortness of breath). , Disp: , Rfl:  .  Ensure Max Protein (ENSURE MAX PROTEIN) LIQD, Take 330 mLs (11 oz total) by mouth daily., Disp:  , Rfl:  .  famotidine (PEPCID) 20 MG tablet, Take 1 tablet (20 mg total) by mouth 2 (two) times daily., Disp: 60 tablet, Rfl: 0 .  FEROSUL 325 (65 Fe) MG tablet, Take 325 mg by mouth 2 (two) times daily., Disp: , Rfl:  .  ferrous sulfate 325 (65 FE) MG tablet, Take 325 mg by mouth 2 (two) times daily., Disp: , Rfl:  .  fluticasone (FLONASE) 50 MCG/ACT nasal spray, Place 2 sprays into both nostrils daily as needed for allergies. , Disp: , Rfl:  .  gabapentin (NEURONTIN) 300 MG capsule, Take 1 capsule (300 mg total) by mouth 2 (two) times daily., Disp: 60 capsule, Rfl: 0 .  hydrocortisone (ANUSOL-HC) 2.5 % rectal cream, Place 1 application rectally 2 (two) times daily as needed for hemorrhoids or itching. , Disp: , Rfl:  .  hydrocortisone 1 % lotion, Apply 1 application topically See admin instructions. Apply small amount to back twice daily for itchy rash as needed, Disp: , Rfl:  .  insulin aspart (NOVOLOG) 100 UNIT/ML injection, Inject 4 Units into the skin 3 (three) times daily with meals., Disp: 10 mL, Rfl: 0 .  insulin aspart protamine - aspart (NOVOLOG MIX 70/30 FLEXPEN) (70-30) 100 UNIT/ML FlexPen, Inject 0.1 mLs (10 Units total) into the skin 2 (two) times daily., Disp: 15 mL, Rfl: 0 .  Insulin Syringe 27G X 1/2" 0.5 ML MISC, 100 Syringes by Does not apply route 3 (three) times daily., Disp: 100 each, Rfl: 1 .   ipratropium-albuterol (DUONEB) 0.5-2.5 (3) MG/3ML SOLN, Take 3 mLs by nebulization every 6 (six) hours as needed (shortness of breath/wheezing)., Disp: 360 mL, Rfl: 0 .  ipratropium-albuterol (DUONEB) 0.5-2.5 (3) MG/3ML SOLN, Take 3 mLs by nebulization every 6 (six) hours as needed., Disp: 360 mL, Rfl: 3 .  isosorbide-hydrALAZINE (BIDIL) 20-37.5 MG tablet, Take 1 tablet by mouth 3 (three) times daily., Disp: 270 tablet, Rfl: 3 .  metolazone (ZAROXOLYN) 2.5 MG tablet, Take 2.5 mg by mouth 4 (four) times a week. Take extra 3mq of potassium when taking this medication, Disp: , Rfl:  .  metolazone (ZAROXOLYN) 2.5 MG tablet, Take 2.5 mg by mouth every Tuesday, Thursday, Saturday, and Sunday., Disp: , Rfl:  .  oxybutynin (DITROPAN) 5 MG tablet, Take 1 tablet (5 mg total) by mouth 4 (four) times daily., Disp: 120 tablet, Rfl: 0 .  oxybutynin (DITROPAN) 5 MG tablet, Take 5 mg by mouth 4 (four) times daily., Disp: , Rfl:  .  oxyCODONE-acetaminophen (PERCOCET) 10-325 MG tablet, Take 1 tablet  by mouth every 6 (six) hours as needed for pain. , Disp: , Rfl:  .  OXYGEN, Inhale 3 L into the lungs continuous. , Disp: , Rfl:  .  pantoprazole (PROTONIX) 40 MG tablet, Take 1 tablet (40 mg total) by mouth 2 (two) times daily., Disp: 60 tablet, Rfl: 0 .  polyethylene glycol (MIRALAX / GLYCOLAX) 17 g packet, Take 17 g by mouth daily as needed., Disp: 14 each, Rfl: 0 .  polyethylene glycol (MIRALAX / GLYCOLAX) packet, Take 17 g by mouth daily., Disp: 14 each, Rfl: 0 .  potassium chloride SA (K-DUR) 20 MEQ tablet, Take 3 tablets (60 mEq total) by mouth 2 (two) times daily. With additional 33mq when you take Metolazone., Disp: 90 tablet, Rfl: 3 .  potassium chloride SA (KLOR-CON) 20 MEQ tablet, Take 60-80 mEq by mouth See admin instructions. Take 60 meq by mouth twice daily all days except on days taking Metolazone take 60 meq in the morning and 80 meq in the evening., Disp: , Rfl:  .  predniSONE (DELTASONE) 20 MG tablet,  Take 1 tablet (20 mg total) by mouth daily with breakfast., Disp: 3 tablet, Rfl: 0 .  PRESCRIPTION MEDICATION, Cpap, Disp: , Rfl:  .  rosuvastatin (CRESTOR) 20 MG tablet, Take 1 tablet (20 mg total) by mouth daily at 6 PM., Disp: 90 tablet, Rfl: 3 .  rosuvastatin (CRESTOR) 20 MG tablet, Take 20 mg by mouth at bedtime., Disp: , Rfl:  .  senna-docusate (SENOKOT-S) 8.6-50 MG tablet, Take 1 tablet by mouth at bedtime as needed for mild constipation., Disp: 30 tablet, Rfl: 0 .  sodium chloride (OCEAN) 0.65 % SOLN nasal spray, Place 2 sprays into both nostrils as needed for congestion. , Disp: , Rfl:  .  spironolactone (ALDACTONE) 25 MG tablet, Take 25 mg by mouth daily., Disp: , Rfl:  .  tamsulosin (FLOMAX) 0.4 MG CAPS capsule, Take 1 capsule (0.4 mg total) by mouth daily after supper., Disp: 30 capsule, Rfl: 3 .  torsemide (DEMADEX) 20 MG tablet, Take 5 tablets (100 mg total) by mouth 2 (two) times daily., Disp: 150 tablet, Rfl: 3 .  torsemide (DEMADEX) 20 MG tablet, Take 100 mg by mouth 2 (two) times daily., Disp: , Rfl:  No Known Allergies    Social History   Socioeconomic History  . Marital status: Married    Spouse name: Not on file  . Number of children: Not on file  . Years of education: Not on file  . Highest education level: Not on file  Occupational History  . Not on file  Social Needs  . Financial resource strain: Not on file  . Food insecurity    Worry: Not on file    Inability: Not on file  . Transportation needs    Medical: Not on file    Non-medical: Not on file  Tobacco Use  . Smoking status: Former Smoker    Packs/day: 1.00    Years: 34.00    Pack years: 34.00    Types: Cigarettes    Quit date: 11/30/2018    Years since quitting: 0.7  . Smokeless tobacco: Never Used  Substance and Sexual Activity  . Alcohol use: No    Alcohol/week: 0.0 standard drinks  . Drug use: No  . Sexual activity: Not on file  Lifestyle  . Physical activity    Days per week: Not on  file    Minutes per session: Not on file  . Stress: Not on file  Relationships  .  Social Herbalist on phone: Not on file    Gets together: Not on file    Attends religious service: Not on file    Active member of club or organization: Not on file    Attends meetings of clubs or organizations: Not on file    Relationship status: Not on file  . Intimate partner violence    Fear of current or ex partner: Not on file    Emotionally abused: Not on file    Physically abused: Not on file    Forced sexual activity: Not on file  Other Topics Concern  . Not on file  Social History Narrative   ** Merged History Encounter **        Physical Exam      Future Appointments  Date Time Provider [place]  09/12/2019  2:30 PM [facility] PA/NP [facility] None    There were no vitals taken for this visit.  Weight yesterday-??  Pt home from hosp from a long admission, they cancelled both post d/c appoint they had last week and then cancelled on me for Thursday so I have not been able to see him since he was d/c.  I see now he has appoint at clinic this afternoon.  Pt asleep at my arrival this morning.  Weight is ranging in the 230s per the wife.  Wife seems to have a good handle on his meds and filling his pill box.  Per his epic list it has him down at taking bisoprolol but on his d/c papers it is not there.   Per d/c papers he is to stop the bidil and bisoprolol.  pepcid--takes PRN  No longer taking tamsulosin  Pill box filled through 11/20.  Pt has clinic appoint today and wife reports he will be there. Before I left, I did check in on him and he was asleep, still breathing--was not using his CPAP.  Wife reports he is not taking the pain pill as much anymore.  No v/s taken as he was asleep.    Marylouise Stacks, [place] [facility] Paramedic  09/12/19

## 2019-09-13 ENCOUNTER — Encounter (HOSPITAL_COMMUNITY): Payer: Self-pay

## 2019-09-13 LAB — FUNGAL ORGANISM REFLEX

## 2019-09-13 LAB — FUNGUS CULTURE WITH STAIN

## 2019-09-13 LAB — FUNGUS CULTURE RESULT

## 2019-09-15 ENCOUNTER — Encounter (HOSPITAL_COMMUNITY): Payer: Self-pay

## 2019-09-15 ENCOUNTER — Other Ambulatory Visit: Payer: Self-pay

## 2019-09-15 ENCOUNTER — Other Ambulatory Visit (HOSPITAL_COMMUNITY): Payer: Self-pay

## 2019-09-15 ENCOUNTER — Ambulatory Visit (HOSPITAL_COMMUNITY)
Admission: RE | Admit: 2019-09-15 | Discharge: 2019-09-15 | Disposition: A | Discharge status: Home / Self Care | Payer: Medicare Other | Source: Ambulatory Visit | Attending: Internal Medicine | Admitting: Internal Medicine

## 2019-09-15 VITALS — BP 120/68 | HR 82

## 2019-09-15 DIAGNOSIS — E785 Hyperlipidemia, unspecified: Secondary | ICD-10-CM | POA: Diagnosis not present

## 2019-09-15 DIAGNOSIS — I5042 Chronic combined systolic (congestive) and diastolic (congestive) heart failure: Secondary | ICD-10-CM | POA: Diagnosis not present

## 2019-09-15 DIAGNOSIS — Z7952 Long term (current) use of systemic steroids: Secondary | ICD-10-CM | POA: Insufficient documentation

## 2019-09-15 DIAGNOSIS — Z7901 Long term (current) use of anticoagulants: Secondary | ICD-10-CM | POA: Diagnosis not present

## 2019-09-15 DIAGNOSIS — Z9981 Dependence on supplemental oxygen: Secondary | ICD-10-CM | POA: Diagnosis not present

## 2019-09-15 DIAGNOSIS — Z79899 Other long term (current) drug therapy: Secondary | ICD-10-CM | POA: Insufficient documentation

## 2019-09-15 DIAGNOSIS — Z8673 Personal history of transient ischemic attack (TIA), and cerebral infarction without residual deficits: Secondary | ICD-10-CM | POA: Diagnosis not present

## 2019-09-15 DIAGNOSIS — I2510 Atherosclerotic heart disease of native coronary artery without angina pectoris: Secondary | ICD-10-CM | POA: Diagnosis not present

## 2019-09-15 DIAGNOSIS — E1122 Type 2 diabetes mellitus with diabetic chronic kidney disease: Secondary | ICD-10-CM | POA: Diagnosis not present

## 2019-09-15 DIAGNOSIS — I5032 Chronic diastolic (congestive) heart failure: Secondary | ICD-10-CM | POA: Diagnosis not present

## 2019-09-15 DIAGNOSIS — I4510 Unspecified right bundle-branch block: Secondary | ICD-10-CM | POA: Diagnosis not present

## 2019-09-15 DIAGNOSIS — I5082 Biventricular heart failure: Secondary | ICD-10-CM | POA: Diagnosis not present

## 2019-09-15 DIAGNOSIS — J441 Chronic obstructive pulmonary disease with (acute) exacerbation: Secondary | ICD-10-CM | POA: Diagnosis not present

## 2019-09-15 DIAGNOSIS — I428 Other cardiomyopathies: Secondary | ICD-10-CM | POA: Diagnosis not present

## 2019-09-15 DIAGNOSIS — Z87891 Personal history of nicotine dependence: Secondary | ICD-10-CM | POA: Insufficient documentation

## 2019-09-15 DIAGNOSIS — Z794 Long term (current) use of insulin: Secondary | ICD-10-CM | POA: Insufficient documentation

## 2019-09-15 DIAGNOSIS — Z8249 Family history of ischemic heart disease and other diseases of the circulatory system: Secondary | ICD-10-CM | POA: Insufficient documentation

## 2019-09-15 DIAGNOSIS — G4733 Obstructive sleep apnea (adult) (pediatric): Secondary | ICD-10-CM | POA: Insufficient documentation

## 2019-09-15 DIAGNOSIS — I480 Paroxysmal atrial fibrillation: Secondary | ICD-10-CM | POA: Diagnosis not present

## 2019-09-15 DIAGNOSIS — I130 Hypertensive heart and chronic kidney disease with heart failure and stage 1 through stage 4 chronic kidney disease, or unspecified chronic kidney disease: Secondary | ICD-10-CM | POA: Insufficient documentation

## 2019-09-15 DIAGNOSIS — N183 Chronic kidney disease, stage 3 unspecified: Secondary | ICD-10-CM | POA: Insufficient documentation

## 2019-09-15 LAB — BASIC METABOLIC PANEL
Anion gap: 14 (ref 5–15)
BUN: 57 mg/dL — ABNORMAL HIGH (ref 8–23)
CO2: 35 mmol/L — ABNORMAL HIGH (ref 22–32)
Calcium: 9 mg/dL (ref 8.9–10.3)
Chloride: 84 mmol/L — ABNORMAL LOW (ref 98–111)
Creatinine, Ser: 2.21 mg/dL — ABNORMAL HIGH (ref 0.61–1.24)
GFR calc Af Amer: 33 mL/min — ABNORMAL LOW (ref >60)
GFR calc non Af Amer: 29 mL/min — ABNORMAL LOW (ref >60)
Glucose, Bld: 229 mg/dL — ABNORMAL HIGH (ref 70–99)
Potassium: 3.9 mmol/L (ref 3.5–5.1)
Sodium: 133 mmol/L — ABNORMAL LOW (ref 135–145)

## 2019-09-15 LAB — CBC
HCT: 33.6 % — ABNORMAL LOW (ref 39.0–52.0)
Hemoglobin: 10.1 g/dL — ABNORMAL LOW (ref 13.0–17.0)
MCH: 26.4 pg (ref 26.0–34.0)
MCHC: 30.1 g/dL (ref 30.0–36.0)
MCV: 87.7 fL (ref 80.0–100.0)
Platelets: 313 10*3/uL (ref 150–400)
RBC: 3.83 MIL/uL — ABNORMAL LOW (ref 4.22–5.81)
RDW: 20.6 % — ABNORMAL HIGH (ref 11.5–15.5)
WBC: 8.6 10*3/uL (ref 4.0–10.5)
nRBC: 0.2 % (ref 0.0–0.2)

## 2019-09-15 NOTE — Patient Instructions (Signed)
.  It was great to see you today! No medication changes are needed at this time..  Labs today We will only contact you if something comes back abnormal or we need to make some changes. Otherwise no news is good news!   Your physician recommends that you schedule a follow-up appointment in: 6-8 weeks with Dr [name]    Do the following things EVERYDAY: 1) Weigh yourself in the morning before breakfast. Write it down and keep it in a log. 2) Take your medicines as prescribed 3) Eat low salt foods-Limit salt (sodium) to 2000 mg per day.  4) Stay as active as you can everyday 5) Limit all fluids for the day to less than 2 liters   At the [facility], you and your health needs are our priority. As part of our continuing mission to provide you with exceptional heart care, we have created designated Provider Care Teams. These Care Teams include your primary Cardiologist (physician) and Advanced Practice Providers (APPs- Physician Assistants and Nurse Practitioners) who all work together to provide you with the care you need, when you need it.   You may see any of the following providers on your designated Care Team at your next follow up: Marland Kitchen Dr Glori Bickers . Dr Loralie Champagne . Darrick Grinder, NP . Lyda Jester, PA   Please be sure to bring in all your medications bottles to every appointment.

## 2019-09-15 NOTE — Progress Notes (Signed)
Heart Failure  Note Date:  09/15/2019   ID:  Nicholas Caldwell, DOB 1946-06-06, MRN [id number]   Provider location: [place] [facility] Type of Visit: Established patient  PCP:  [name], MD  HF Cardiology: Dr [name]   Chief Complaint: [facility] F/u for Heart Failure    History of Present Illness: Nicholas Caldwell is a 73 y.o. male with a history of chronic systolic CHFdue to NICM (EF 40-45%)with moderate RV failure, COPD on 3 L O2, DM, HTN, OHS/OSA on CPAP, PAF s/p DCCV, hyperlipidemia, CAD, and noncompliance.He has multiple admissions due to HF and respiratory failure.   Most recently admitted from 03/07/19-03/31/19 with massive volume overload with severe RV failure, AFL and cardiorenal syndrome. Attempts at diuresis limited by worsening creatinine which peaked at 2.6. Treated with milrinone but still unable to get CVP below 15. Hospital course c/b severe non-compliance with high fluid intake, PAFL, penile wound, acute versus subacute CVA and Citrobacter UTI. Underwent DC-CV of AFL into NSR.  D/c'd home on 5/28 with weight of 270 and creatinine 1.99. Readmitted6/6/2020with recurrent hypoxic/hypercarbic respiratory failure. (ABG 7.27/66/55/86%). Found to have severe hyperkalemia (K>7.5) and large left pleural effusion. Weightwasup about 10 pounds since d/c.Diuresed with IV lasix and later transitioned to torsemide 80 mg twice a day.  Discharge weight on 04/14/2019 was 251 pounds.   Admitted 0/7/12 with A/C systolic/diastolic heart failure. Bisoprolol was stopped on admit and not continued on discharge. Diuresed with with IV lasix and transitioned back to torsemide 80 mg twice a day + metolazone 4 times a week.   Admitted 11/11/73 with A/C systolic/diastolic heart failure + AECOPD. Diuresed with IV lasix and discharged on torsemide 80 mg twice a day + metolazone 4x a week. Treated with steroids and antibiotics. He was placed on low dose bisoprolol.  Palliative Care  consulted. MOST form was introduced. He wished to continue full code.   He was admitted again in 10/20 with RLL PNA (treated with ceftriaxone), CHF exacerbation, and right pleural effusion.  He had right-sided thoracentesis.   He was readmitted 10/15 from home with PEA arrest with OOH CPR ~ 12 mins. He underwent therapeutic hypothermia, rewarmed and extubated. The cause of his PEA arrest was suspected to be likely driven by respiratory etiology in the setting of severe underlying pulmonary disease and reported increased opiate use and noncompliance w/ Bipap. AHF team was consulted to assist w/ acute on chronic CHF. Echo was done and showed EF up to 60-65% but with severely dilated and dysfunctional RV. He was treated w/ IV diuretics and transitioned back to PO torsemide 100 mg bid. Bidil was discontinued due to low BP and fact that his EF is now in normal range. He was continued on spiro. Bisoprolol also discontinued due to low BP. His hospital course was also c/b atrial fibrillation w/ RVR. This was treated w/ amiodarone and he converted back to NSR. He was continued on Eliquis for a/c.   He presents to clinic today for f/u. Here w/ his wife and paramedic, Katie, Weight 246 lb (243 lb at time of d/c). Remains on supplemental O2 at 3.5 L/min. No required increase in O2 requirement since discharge. Reports that he sleeps with his BiPAP "most of the time but not all of the time". Still taking pain pills for chronic LBP and arthritis. Takes them Q6H. Followed by pain specialist. No LEE on exam. ReDs clip reading 31%. EKG performed. Difficult to interpret due to motion artifact (has resting  tremor). R-R interval regular. RRR on exam. Compliant w/ amiodarone and Eliquis.   ECG (personally reviewed): Difficult to interpret for p waves due to motion artifact (has resting tremor). R-R interval regular. RRR on exam.   Labs (10/20): K 3.3, creatinine 1.7  PMH: 1. Chronic systolic CHF: Nonischemic cardiomyopathy,  diagnosed in 2016.  - LHC (1/16) with mild nonobstructive CAD.  - Echo (12/17): EF 35-40%, mildly dilated RV with mild to moderately decreased RV systolic function, PASP 57 mmHg.  - Echo (3/20): EF 45-50%, mild LVH, D-shaped septum with severe RV dilation and RV function.  - TEE (3/20): EF 40-45%, moderate LVH, severe RV dilation with moderate dysfunction.  - RHC (2/20): mean RA 10, PA 52/16 mean 30, mean PCWP 10, CI 3.85, PVR 2.2 WU - Echo (9/20): EF >65%, moderate RV dilation with mildly decreased systolic function.  2. Atrial fibrillation: Paroxysmal. Severe LAE, has seen Dr Rayann Heman and thought to have low chance of success with atrial fibrillation ablation. Tikosyn not used due to long QT. He was on amiodarone in the past but this was stopped due to long QT.  - DCCV 3/20, 5/20.  3. COPD: On 3 L home oxygen.  4. Type II diabetes 5. Hyperlipidemia 6. HTN 7. OHS/OSA: Home oxygen + CPAP.  8. CAD: LHC (2/20) with 50-60% distal LAD, 50% proximal LCx, 50% proximal RCA (nondominant RCA).  9. CVA: 5/20.  10. CKD: Stage 3.  11. ABIs (9/20): normal.   Current Outpatient Medications  Medication Sig Dispense Refill   albuterol (VENTOLIN HFA) 108 (90 Base) MCG/ACT inhaler Inhale 2 puffs into the lungs every 6 (six) hours as needed for wheezing or shortness of breath.     apixaban (ELIQUIS) 5 MG TABS tablet Take 1 tablet (5 mg total) by mouth 2 (two) times daily. 60 tablet 1   bisoprolol (ZEBETA) 5 MG tablet Take 5 mg by mouth at bedtime.     budesonide-formoterol (SYMBICORT) 160-4.5 MCG/ACT inhaler Inhale 2 puffs into the lungs as needed (shortness of breath).      Ensure Max Protein (ENSURE MAX PROTEIN) LIQD Take 330 mLs (11 oz total) by mouth daily.     famotidine (PEPCID) 20 MG tablet Take 1 tablet (20 mg total) by mouth 2 (two) times daily. (Patient taking differently: Take 20 mg by mouth 2 (two) times daily. ) 60 tablet 0   FEROSUL 325 (65 Fe) MG tablet Take 325 mg by mouth 2 (two)  times daily.     fluticasone (FLONASE) 50 MCG/ACT nasal spray Place 2 sprays into both nostrils daily as needed for allergies.      gabapentin (NEURONTIN) 300 MG capsule Take 1 capsule (300 mg total) by mouth 2 (two) times daily. 60 capsule 0   hydrocortisone (ANUSOL-HC) 2.5 % rectal cream Place 1 application rectally 2 (two) times daily as needed for hemorrhoids or itching.      hydrocortisone 1 % lotion Apply 1 application topically See admin instructions. Apply small amount to back twice daily for itchy rash as needed     insulin aspart (NOVOLOG) 100 UNIT/ML injection Inject 4 Units into the skin 3 (three) times daily with meals. 10 mL 0   insulin aspart protamine - aspart (NOVOLOG MIX 70/30 FLEXPEN) (70-30) 100 UNIT/ML FlexPen Inject 0.1 mLs (10 Units total) into the skin 2 (two) times daily. 15 mL 0   Insulin Syringe 27G X 1/2" 0.5 ML MISC 100 Syringes by Does not apply route 3 (three) times daily. 100 each 1  ipratropium-albuterol (DUONEB) 0.5-2.5 (3) MG/3ML SOLN Take 3 mLs by nebulization every 6 (six) hours as needed (shortness of breath/wheezing). 360 mL 0   ipratropium-albuterol (DUONEB) 0.5-2.5 (3) MG/3ML SOLN Take 3 mLs by nebulization every 6 (six) hours as needed. 360 mL 3   metolazone (ZAROXOLYN) 2.5 MG tablet Take 2.5 mg by mouth every Tuesday, Thursday, Saturday, and Sunday.     oxybutynin (DITROPAN) 5 MG tablet Take 1 tablet (5 mg total) by mouth 4 (four) times daily. (Patient not taking: Reported on 09/12/2019) 120 tablet 0   oxyCODONE-acetaminophen (PERCOCET) 10-325 MG tablet Take 1 tablet by mouth every 6 (six) hours as needed for pain.      OXYGEN Inhale 3 L into the lungs continuous.      pantoprazole (PROTONIX) 40 MG tablet Take 1 tablet (40 mg total) by mouth 2 (two) times daily. (Patient not taking: Reported on 09/12/2019) 60 tablet 0   polyethylene glycol (MIRALAX / GLYCOLAX) 17 g packet Take 17 g by mouth daily as needed. 14 each 0   polyethylene glycol  (MIRALAX / GLYCOLAX) packet Take 17 g by mouth daily. (Patient not taking: Reported on 09/12/2019) 14 each 0   potassium chloride SA (K-DUR) 20 MEQ tablet Take 3 tablets (60 mEq total) by mouth 2 (two) times daily. With additional 84mq when you take Metolazone. 90 tablet 3   predniSONE (DELTASONE) 20 MG tablet Take 1 tablet (20 mg total) by mouth daily with breakfast. 3 tablet 0   PRESCRIPTION MEDICATION Cpap     rosuvastatin (CRESTOR) 20 MG tablet Take 1 tablet (20 mg total) by mouth daily at 6 PM. 90 tablet 3   rosuvastatin (CRESTOR) 20 MG tablet Take 20 mg by mouth at bedtime.     senna-docusate (SENOKOT-S) 8.6-50 MG tablet Take 1 tablet by mouth at bedtime as needed for mild constipation. 30 tablet 0   sodium chloride (OCEAN) 0.65 % SOLN nasal spray Place 2 sprays into both nostrils as needed for congestion.      spironolactone (ALDACTONE) 25 MG tablet Take 25 mg by mouth daily.     tamsulosin (FLOMAX) 0.4 MG CAPS capsule Take 1 capsule (0.4 mg total) by mouth daily after supper. (Patient not taking: Reported on 09/12/2019) 30 capsule 3   torsemide (DEMADEX) 20 MG tablet Take 5 tablets (100 mg total) by mouth 2 (two) times daily. (Patient not taking: Reported on 09/12/2019) 150 tablet 3   torsemide (DEMADEX) 20 MG tablet Take 100 mg by mouth 2 (two) times daily.     No current facility-administered medications for this encounter.     Allergies:   Patient has no known allergies.   Social History:  The patient  reports that he quit smoking about 9 months ago. His smoking use included cigarettes. He has a 34.00 pack-year smoking history. He has never used smokeless tobacco. He reports that he does not drink alcohol or use drugs.   Family History:  The patient's family history includes Heart disease in his father and mother; Heart failure in his mother; Hypertension in his mother and sister.   ROS:  Please see the history of present illness.   All other systems are personally reviewed  and negative.  Wt Readings from Last 3 Encounters:  08/29/19 110.7 kg (244 lb 1.6 oz)  08/18/19 116.1 kg (256 lb)  08/17/19 116.1 kg (256 lb)   Vitals:   09/15/19 1434  BP: 120/68  Pulse: 82  SpO2: 97%    Exam:   PHYSICAL  EXAM: General:  Elderly AAM, on supp O2, No respiratory difficulty, resting tremor  HEENT: normal Neck: supple. no JVD. Carotids 2+ bilat; no bruits. No lymphadenopathy or thyromegaly appreciated. Cor: PMI nondisplaced. Regular rate & rhythm. No rubs, gallops or murmurs. Lungs: clear Abdomen: soft, nontender, nondistended. No hepatosplenomegaly. No bruits or masses. Good bowel sounds. Extremities: no cyanosis, clubbing, rash, edema Neuro: alert & oriented x 3, cranial nerves grossly intact. moves all 4 extremities w/o difficulty. Affect pleasant.   Recent Labs: 04/10/2019: TSH 0.587 08/20/2019: ALT 31; B Natriuretic Peptide 4,121.7 08/29/2019: BUN 64; Creatinine, Ser 1.64; Hemoglobin 8.8; Magnesium 2.0; Platelets 331; Potassium 3.0; Sodium 143  Personally reviewed   Wt Readings from Last 3 Encounters:  08/29/19 110.7 kg (244 lb 1.6 oz)  08/18/19 116.1 kg (256 lb)  08/17/19 116.1 kg (256 lb)      ASSESSMENT AND PLAN:  1. Chronic systolic => diastolic CHF: With prominent RV failure, likely related to COPD/OSA/OHS. TEE in 3/20 with EF 40-45%, severely dilated RV with moderately decreased systolic function. However, repeat echo in 9/20 showed EF >65% with moderate RV dilation/mildly decreased RV systolic function. He has had multiple recent admissions with CHF and COPD exacerbations. Required IV diuretics recent admit 08/2019 and diuresed down to 243 lb.  -He is chronically NYHA class IIIb.  - Volume stable since hospital d/c. Wt 246 lb (243 at d/c). ReDs Clip 31%.  - Continue torsemide to 100 mg bid.  Continue metolazone 4 times/week.  - Continue Spironolactone 25 mg daily - Check BMET today  - Bidil and Bisoprolol discontinued recent hospitalization due to  soft BP  - Continue w/ paramedicine program to help w/ med compliance  - 2. Atrial fibrillation: Paroxysmal. He has history of CVA.  He had DCCV in 3/20 and again in 5/20.  He failed amiodarone in the past and cannot take Tikosyn due to baseline prolonged QT interval.  Had recurrent afib recent hospitalization and converted to NSR w/ amiodarone. Poor ablation candidate with LA size.  - EKG today difficult to interpret due to motion artifact (has resting tremor). R-R interval regular. RRR on exam. - Continue amiodarone 200 mg daily  - Continue apixaban 5 mg bid.   3. COPD: No longer smokes. Continue home oxygen. CT chest showed emphysema.  - Dr. Opal Sidles 4. OHS/OSA: Continue 3 L oxygen during the day and CPAP at night. We discussed importance of full compliance w/ CPAP during sleep.   5. CAD: Nonobstructive disease in 2/20. No chest pain.  - Continue crestor 20 mg daily. - He is on apixaban so can stop ASA.  6. CKD: Stage 3.  BMET today.   F/u w/ [name] in 6-8 weeks.   Signed, Lyda Jester, PA-C  09/15/2019  [place] [address] [facility] [place] [address] [phone] (office) [phone] (fax)

## 2019-09-15 NOTE — Progress Notes (Signed)
Paramedicine Encounter   Patient ID: Nicholas Caldwell , male,   DOB: 06/18/1946,73 y.o.,  MRN: [id number]   Met patient in clinic today with provider.   B/p-120/68 p-82 sp02-97 Weight @ clinic-no weight today  Weight @ home-246  He was 243 when he left his hosp.   Pt here at clinic today. Pt states he feels like he has pneumonia- he reports he is coughing and coughing up thick phlegm and he states sometimes he cant even talk. Also having chills.  He states he goes to pain specialists for [facility]  He mentioned his PCP wants him to get his A1C down so he can get a hip replacement--and if he can be ok'd to have it done.  He also states he has the shakes after he takes his morning dose of meds-it lasts all day usually- REDS vest-31% No med changes today.  He is getting angry when talking about his pain meds causing his admission last time and he was asking why they kept giving it to him. He is not able to correlate that with his lung condition and taking narcotics and without his using his BIPAP could cause resp depression and could cause resp failure.  Will f/u end of next week.    , EMT-Paramedic [phone] 09/15/2019       

## 2019-09-21 DIAGNOSIS — E7800 Pure hypercholesterolemia, unspecified: Secondary | ICD-10-CM | POA: Diagnosis not present

## 2019-09-21 DIAGNOSIS — I25119 Atherosclerotic heart disease of native coronary artery with unspecified angina pectoris: Secondary | ICD-10-CM | POA: Diagnosis not present

## 2019-09-21 DIAGNOSIS — I0981 Rheumatic heart failure: Secondary | ICD-10-CM | POA: Diagnosis not present

## 2019-09-21 DIAGNOSIS — D631 Anemia in chronic kidney disease: Secondary | ICD-10-CM | POA: Diagnosis not present

## 2019-09-21 DIAGNOSIS — E1122 Type 2 diabetes mellitus with diabetic chronic kidney disease: Secondary | ICD-10-CM | POA: Diagnosis not present

## 2019-09-21 DIAGNOSIS — I5082 Biventricular heart failure: Secondary | ICD-10-CM | POA: Diagnosis not present

## 2019-09-21 DIAGNOSIS — Z7901 Long term (current) use of anticoagulants: Secondary | ICD-10-CM | POA: Diagnosis not present

## 2019-09-21 DIAGNOSIS — G8929 Other chronic pain: Secondary | ICD-10-CM | POA: Diagnosis not present

## 2019-09-21 DIAGNOSIS — I4892 Unspecified atrial flutter: Secondary | ICD-10-CM | POA: Diagnosis not present

## 2019-09-21 DIAGNOSIS — I50810 Right heart failure, unspecified: Secondary | ICD-10-CM | POA: Diagnosis not present

## 2019-09-21 DIAGNOSIS — E662 Morbid (severe) obesity with alveolar hypoventilation: Secondary | ICD-10-CM | POA: Diagnosis not present

## 2019-09-21 DIAGNOSIS — J9621 Acute and chronic respiratory failure with hypoxia: Secondary | ICD-10-CM | POA: Diagnosis not present

## 2019-09-21 DIAGNOSIS — I428 Other cardiomyopathies: Secondary | ICD-10-CM | POA: Diagnosis not present

## 2019-09-21 DIAGNOSIS — I130 Hypertensive heart and chronic kidney disease with heart failure and stage 1 through stage 4 chronic kidney disease, or unspecified chronic kidney disease: Secondary | ICD-10-CM | POA: Diagnosis not present

## 2019-09-21 DIAGNOSIS — Z794 Long term (current) use of insulin: Secondary | ICD-10-CM | POA: Diagnosis not present

## 2019-09-21 DIAGNOSIS — J449 Chronic obstructive pulmonary disease, unspecified: Secondary | ICD-10-CM | POA: Diagnosis not present

## 2019-09-21 DIAGNOSIS — N184 Chronic kidney disease, stage 4 (severe): Secondary | ICD-10-CM | POA: Diagnosis not present

## 2019-09-21 DIAGNOSIS — Z87891 Personal history of nicotine dependence: Secondary | ICD-10-CM | POA: Diagnosis not present

## 2019-09-21 DIAGNOSIS — Z9181 History of falling: Secondary | ICD-10-CM | POA: Diagnosis not present

## 2019-09-21 DIAGNOSIS — Z9981 Dependence on supplemental oxygen: Secondary | ICD-10-CM | POA: Diagnosis not present

## 2019-09-21 DIAGNOSIS — Z8674 Personal history of sudden cardiac arrest: Secondary | ICD-10-CM | POA: Diagnosis not present

## 2019-09-21 DIAGNOSIS — I5042 Chronic combined systolic (congestive) and diastolic (congestive) heart failure: Secondary | ICD-10-CM | POA: Diagnosis not present

## 2019-09-21 DIAGNOSIS — J9622 Acute and chronic respiratory failure with hypercapnia: Secondary | ICD-10-CM | POA: Diagnosis not present

## 2019-09-21 DIAGNOSIS — E1140 Type 2 diabetes mellitus with diabetic neuropathy, unspecified: Secondary | ICD-10-CM | POA: Diagnosis not present

## 2019-09-21 DIAGNOSIS — I081 Rheumatic disorders of both mitral and tricuspid valves: Secondary | ICD-10-CM | POA: Diagnosis not present

## 2019-09-22 ENCOUNTER — Telehealth (HOSPITAL_COMMUNITY): Payer: Self-pay

## 2019-09-22 DIAGNOSIS — I50810 Right heart failure, unspecified: Secondary | ICD-10-CM | POA: Diagnosis not present

## 2019-09-22 DIAGNOSIS — I0981 Rheumatic heart failure: Secondary | ICD-10-CM | POA: Diagnosis not present

## 2019-09-22 DIAGNOSIS — I130 Hypertensive heart and chronic kidney disease with heart failure and stage 1 through stage 4 chronic kidney disease, or unspecified chronic kidney disease: Secondary | ICD-10-CM | POA: Diagnosis not present

## 2019-09-22 DIAGNOSIS — I5042 Chronic combined systolic (congestive) and diastolic (congestive) heart failure: Secondary | ICD-10-CM | POA: Diagnosis not present

## 2019-09-22 DIAGNOSIS — I5082 Biventricular heart failure: Secondary | ICD-10-CM | POA: Diagnosis not present

## 2019-09-22 DIAGNOSIS — I25119 Atherosclerotic heart disease of native coronary artery with unspecified angina pectoris: Secondary | ICD-10-CM | POA: Diagnosis not present

## 2019-09-22 NOTE — Telephone Encounter (Signed)
pts wife asked that I come out to see him next week, but he has been feeling ok.  Got him down for next Wednesday.   Marylouise Stacks, EMT-Paramedic  09/22/19

## 2019-09-22 NOTE — Telephone Encounter (Signed)
HH orders signed by MD and faxed to [facility], confirmation received.

## 2019-09-25 LAB — ACID FAST CULTURE WITH REFLEXED SENSITIVITIES (MYCOBACTERIA): Acid Fast Culture: NEGATIVE

## 2019-09-27 ENCOUNTER — Other Ambulatory Visit (HOSPITAL_COMMUNITY): Payer: Self-pay | Admitting: Cardiology

## 2019-09-27 MED ORDER — FAMOTIDINE 20 MG PO TABS: 20.0000 mg | ORAL_TABLET | Freq: Two times a day (BID) | ORAL | 0 refills | Status: DC

## 2019-09-27 NOTE — Telephone Encounter (Signed)
Courtesy refill done for pepcid until patient is able to be seen by PCP

## 2019-09-28 ENCOUNTER — Other Ambulatory Visit (HOSPITAL_COMMUNITY): Payer: Self-pay

## 2019-09-28 ENCOUNTER — Telehealth (HOSPITAL_COMMUNITY): Payer: Self-pay | Admitting: Pharmacist

## 2019-09-28 MED ORDER — SPIRONOLACTONE 25 MG PO TABS: 25.0000 mg | ORAL_TABLET | Freq: Every day | ORAL | 6 refills | Status: DC

## 2019-09-28 NOTE — Progress Notes (Signed)
Paramedicine Encounter    Patient ID: Nicholas Caldwell, male    DOB: 1946/09/09, 73 y.o.   MRN: [id number]   Patient Care Team: [name], MD as PCP - General (Internal Medicine) Jettie Booze, MD as Consulting Physician (Cardiology) Sharmon Revere as Physician Assistant (Cardiology) [name], MD as Attending Physician (Internal Medicine) Thompson Grayer, MD as Consulting Physician (Clinical Cardiac Electrophysiology) [name], MD (Internal Medicine)  Patient Active Problem List   Diagnosis Date Noted  . Acute respiratory failure with hypercapnia ([place])   . Encounter for intubation   . Pressure injury of skin 08/21/2019  . Cardiac arrest ([place]) 08/20/2019  . Palliative care by specialist   . DNR (do not resuscitate) discussion   . Fatigue   . Acute CHF (congestive heart failure) ([place]) 07/07/2019  . Acute on chronic heart failure ([place]) 06/09/2019  . Lobar pneumonia ([place]) 04/14/2019  . Hyperkalemia 04/10/2019  . Cerebral embolism with cerebral infarction 03/21/2019  . Gastritis and gastroduodenitis   . NSVT (nonsustained ventricular tachycardia) ([place]) 03/08/2019  . Tobacco abuse counseling 03/08/2019  . Iron deficiency anemia   . Acute on chronic systolic CHF (congestive heart failure) ([place]) 03/06/2019  . Diabetes mellitus type 2 in obese ([place])   . Primary osteoarthritis of right hip   . Hypomagnesemia   . Steroid-induced hyperglycemia   . Supplemental oxygen dependent   . Leukocytosis   . Hypokalemia   . Acute on chronic anemia   . Diabetic peripheral neuropathy ([place])   . Acute on chronic systolic (congestive) heart failure ([place])   . Acute on chronic respiratory failure ([place]) 01/14/2019  . Hypoxia   . Altered mental status   . Heart failure ([place]) 12/30/2018  . AKI (acute kidney injury) ([place])   . Acute respiratory failure ([place]) 11/22/2018  . Acute on chronic respiratory failure with hypoxia and hypercapnia ([place]) 11/13/2018  . Metabolic  encephalopathy 99/37/1696  . Acute respiratory failure with hypoxia and hypercapnia ([place]) 07/26/2018  . Acute exacerbation of CHF (congestive heart failure) ([place]) 07/26/2018  . CHF exacerbation ([place]) 05/01/2018  . CHF (congestive heart failure) ([place]) 04/30/2018  . Chronic respiratory failure with hypoxia ([place]) 12/28/2017  . Acute encephalopathy   . Atrial fibrillation with RVR ([place])   . SVT (supraventricular tachycardia) ([place])   . COPD GOLD 0   . Medically noncompliant   . Panlobular emphysema ([place])   . OSA (obstructive sleep apnea)   . Pulmonary hypertension ([place])   . Disorientation   . Pressure injury of skin 09/07/2016  . Acute on chronic combined systolic and diastolic CHF (congestive heart failure) ([place]) 09/05/2016  . Skin lesion-left heal 09/05/2016  . Chronic systolic CHF (congestive heart failure) ([place]) 07/16/2016  . COPD (chronic obstructive pulmonary disease) ([place]) 07/16/2016  . Uncontrolled type 2 diabetes mellitus with complication ([place])   . Diabetic polyneuropathy associated with diabetes mellitus due to underlying condition ([place])   . Normocytic anemia 06/29/2016  . CAD - Non-obstructive by LHC1/16 01/21/2016  . Prolonged QT interval 10/09/2015  . Nonischemic cardiomyopathy ([place]) 10/09/2015  . PAF (paroxysmal atrial fibrillation) ([place])   . Chronic pain 02/19/2015  . Morbid obesity ([place]) 02/13/2015  . Dyspnea   . Elevated troponin I level 11/01/2014  . COPD exacerbation ([place]) 11/01/2014  . Essential hypertension 10/31/2014  . Type 2 diabetes mellitus with neuropathy 10/31/2014  . Hyperlipidemia  10/31/2014  . Cigarette smoker 10/31/2014    Current Outpatient Medications:  .  albuterol (VENTOLIN HFA) 108 (90 Base)  MCG/ACT inhaler, Inhale 2 puffs into the lungs every 6 (six) hours as needed for wheezing or shortness of breath., Disp: , Rfl:  .  amiodarone (PACERONE) 200 MG tablet, Take 200 mg by mouth daily., Disp: , Rfl:  .  apixaban (ELIQUIS) 5 MG TABS tablet,  Take 1 tablet (5 mg total) by mouth 2 (two) times daily., Disp: 60 tablet, Rfl: 1 .  budesonide-formoterol (SYMBICORT) 160-4.5 MCG/ACT inhaler, Inhale 2 puffs into the lungs as needed (shortness of breath). , Disp: , Rfl:  .  Ensure Max Protein (ENSURE MAX PROTEIN) LIQD, Take 330 mLs (11 oz total) by mouth daily., Disp:  , Rfl:  .  famotidine (PEPCID) 20 MG tablet, Take 1 tablet (20 mg total) by mouth 2 (two) times daily., Disp: 60 tablet, Rfl: 0 .  FEROSUL 325 (65 Fe) MG tablet, Take 325 mg by mouth 2 (two) times daily., Disp: , Rfl:  .  fluticasone (FLONASE) 50 MCG/ACT nasal spray, Place 2 sprays into both nostrils daily as needed for allergies. , Disp: , Rfl:  .  gabapentin (NEURONTIN) 300 MG capsule, Take 1 capsule (300 mg total) by mouth 2 (two) times daily., Disp: 60 capsule, Rfl: 0 .  hydrocortisone (ANUSOL-HC) 2.5 % rectal cream, Place 1 application rectally 2 (two) times daily as needed for hemorrhoids or itching. , Disp: , Rfl:  .  hydrocortisone 1 % lotion, Apply 1 application topically See admin instructions. Apply small amount to back twice daily for itchy rash as needed, Disp: , Rfl:  .  insulin aspart (NOVOLOG) 100 UNIT/ML injection, Inject 4 Units into the skin 3 (three) times daily with meals., Disp: 10 mL, Rfl: 0 .  insulin aspart protamine - aspart (NOVOLOG MIX 70/30 FLEXPEN) (70-30) 100 UNIT/ML FlexPen, Inject 0.1 mLs (10 Units total) into the skin 2 (two) times daily., Disp: 15 mL, Rfl: 0 .  Insulin Syringe 27G X 1/2" 0.5 ML MISC, 100 Syringes by Does not apply route 3 (three) times daily., Disp: 100 each, Rfl: 1 .  ipratropium-albuterol (DUONEB) 0.5-2.5 (3) MG/3ML SOLN, Take 3 mLs by nebulization every 6 (six) hours as needed (shortness of breath/wheezing)., Disp: 360 mL, Rfl: 0 .  ipratropium-albuterol (DUONEB) 0.5-2.5 (3) MG/3ML SOLN, Take 3 mLs by nebulization every 6 (six) hours as needed., Disp: 360 mL, Rfl: 3 .  metolazone (ZAROXOLYN) 2.5 MG tablet, Take 2.5 mg by mouth  every Tuesday, Thursday, Saturday, and Sunday., Disp: , Rfl:  .  oxybutynin (DITROPAN) 5 MG tablet, Take 1 tablet (5 mg total) by mouth 4 (four) times daily., Disp: 120 tablet, Rfl: 0 .  oxyCODONE-acetaminophen (PERCOCET) 10-325 MG tablet, Take 1 tablet by mouth every 6 (six) hours as needed for pain. , Disp: , Rfl:  .  OXYGEN, Inhale 3 L into the lungs continuous. , Disp: , Rfl:  .  polyethylene glycol (MIRALAX / GLYCOLAX) 17 g packet, Take 17 g by mouth daily as needed., Disp: 14 each, Rfl: 0 .  polyethylene glycol (MIRALAX / GLYCOLAX) packet, Take 17 g by mouth daily., Disp: 14 each, Rfl: 0 .  potassium chloride SA (K-DUR) 20 MEQ tablet, Take 3 tablets (60 mEq total) by mouth 2 (two) times daily. With additional 18mq when you take Metolazone., Disp: 90 tablet, Rfl: 3 .  rosuvastatin (CRESTOR) 20 MG tablet, Take 20 mg by mouth at bedtime., Disp: , Rfl:  .  senna-docusate (SENOKOT-S) 8.6-50 MG tablet, Take 1 tablet by mouth at bedtime as needed for mild constipation., Disp: 30 tablet, Rfl:  0 .  sodium chloride (OCEAN) 0.65 % SOLN nasal spray, Place 2 sprays into both nostrils as needed for congestion. , Disp: , Rfl:  .  torsemide (DEMADEX) 20 MG tablet, Take 5 tablets (100 mg total) by mouth 2 (two) times daily., Disp: 150 tablet, Rfl: 3 .  pantoprazole (PROTONIX) 40 MG tablet, Take 1 tablet (40 mg total) by mouth 2 (two) times daily. (Patient not taking: Reported on 09/28/2019), Disp: 60 tablet, Rfl: 0 .  PRESCRIPTION MEDICATION, Cpap, Disp: , Rfl:  .  spironolactone (ALDACTONE) 25 MG tablet, Take 1 tablet (25 mg total) by mouth daily., Disp: 30 tablet, Rfl: 6 .  tamsulosin (FLOMAX) 0.4 MG CAPS capsule, Take 1 capsule (0.4 mg total) by mouth daily after supper. (Patient not taking: Reported on 09/28/2019), Disp: 30 capsule, Rfl: 3 No Known Allergies    Social History   Socioeconomic History  . Marital status: Married    Spouse name: Not on file  . Number of children: Not on file  . Years  of education: Not on file  . Highest education level: Not on file  Occupational History  . Not on file  Social Needs  . Financial resource strain: Not on file  . Food insecurity    Worry: Not on file    Inability: Not on file  . Transportation needs    Medical: Not on file    Non-medical: Not on file  Tobacco Use  . Smoking status: Former Smoker    Packs/day: 1.00    Years: 34.00    Pack years: 34.00    Types: Cigarettes    Quit date: 11/30/2018    Years since quitting: 0.8  . Smokeless tobacco: Never Used  Substance and Sexual Activity  . Alcohol use: No    Alcohol/week: 0.0 standard drinks  . Drug use: No  . Sexual activity: Not on file  Lifestyle  . Physical activity    Days per week: Not on file    Minutes per session: Not on file  . Stress: Not on file  Relationships  . Social Herbalist on phone: Not on file    Gets together: Not on file    Attends religious service: Not on file    Active member of club or organization: Not on file    Attends meetings of clubs or organizations: Not on file    Relationship status: Not on file  . Intimate partner violence    Fear of current or ex partner: Not on file    Emotionally abused: Not on file    Physically abused: Not on file    Forced sexual activity: Not on file  Other Topics Concern  . Not on file  Social History Narrative   ** Merged History Encounter **        Physical Exam      Future Appointments  Date Time Provider [place]  11/01/2019  2:40 PM Larey Dresser, MD [facility] None    BP 118/60   Pulse 66   Resp 18   Wt 247 lb (112 kg)   SpO2 98%   BMI 34.45 kg/m   Weight yesterday-246 Last visit weight-246  Pt in bathroom when I arrived. Wife reports he has been constipated for past week. He did have BM this morning.  When I filled pill box last time I did place it in there twice a day but wife only gave it to him on mon/wed/fri b/c that's what it  said on bottle that was  filled on 02/2019.  He is out of his famotidine, out of spiro He has a few tabs left of spiro and wife will give that to him until he runs out to keep it simpler to know when he needs it--she will get it from pharmacy on Friday and place it where needed.  Pill box filled up through 12/8.  He has a wheeze to the left side. He is still taking the oxy--he has already been told to stop per d/c instructions. But he continues and gets agitated at any point it is discussed about stopping. I did tell him he should take stool softener when he takes the pain pills as that causes constipation.  He drinks a lot of sodas, little water intake. So I advised him to cut back on the sodas and replace those with water.   Marylouise Stacks, [facility] Paramedic  09/28/19

## 2019-09-28 NOTE — Telephone Encounter (Signed)
Prescription for spironolactone 25 mg daily sent to [facility] per request from Marylouise Stacks, Paramedic.  Audry Riles, PharmD, BCPS, BCCP, CPP [facility] Failure [facility] Pharmacist [phone]

## 2019-10-03 IMAGING — CR DG CHEST 2V
2 series · 2 of 2 positions shown · non-contrast
Comparison: Chest radiograph dated 03/15/2018

CLINICAL DATA: 72-year-old male with shortness of breath.

EXAM:
CHEST - 2 VIEW

[chest lat]
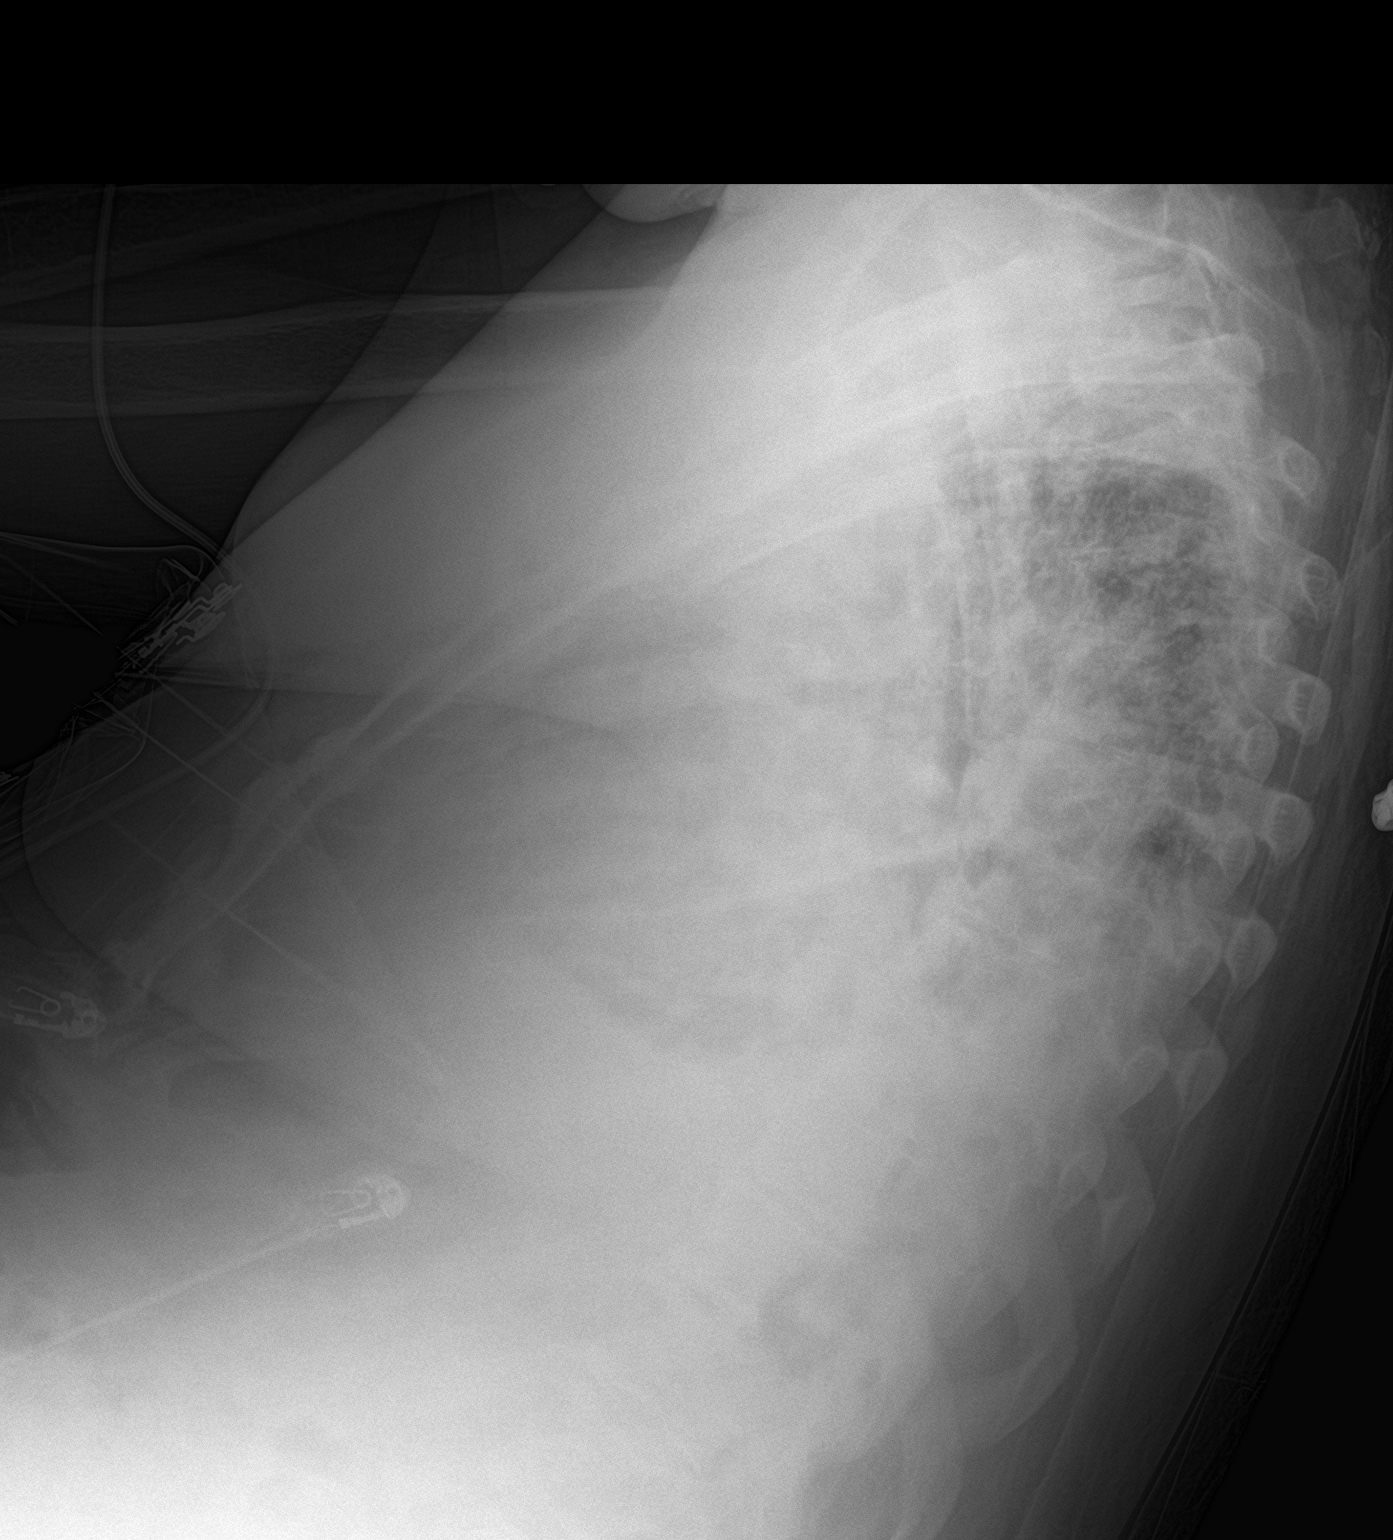

[chest ap]
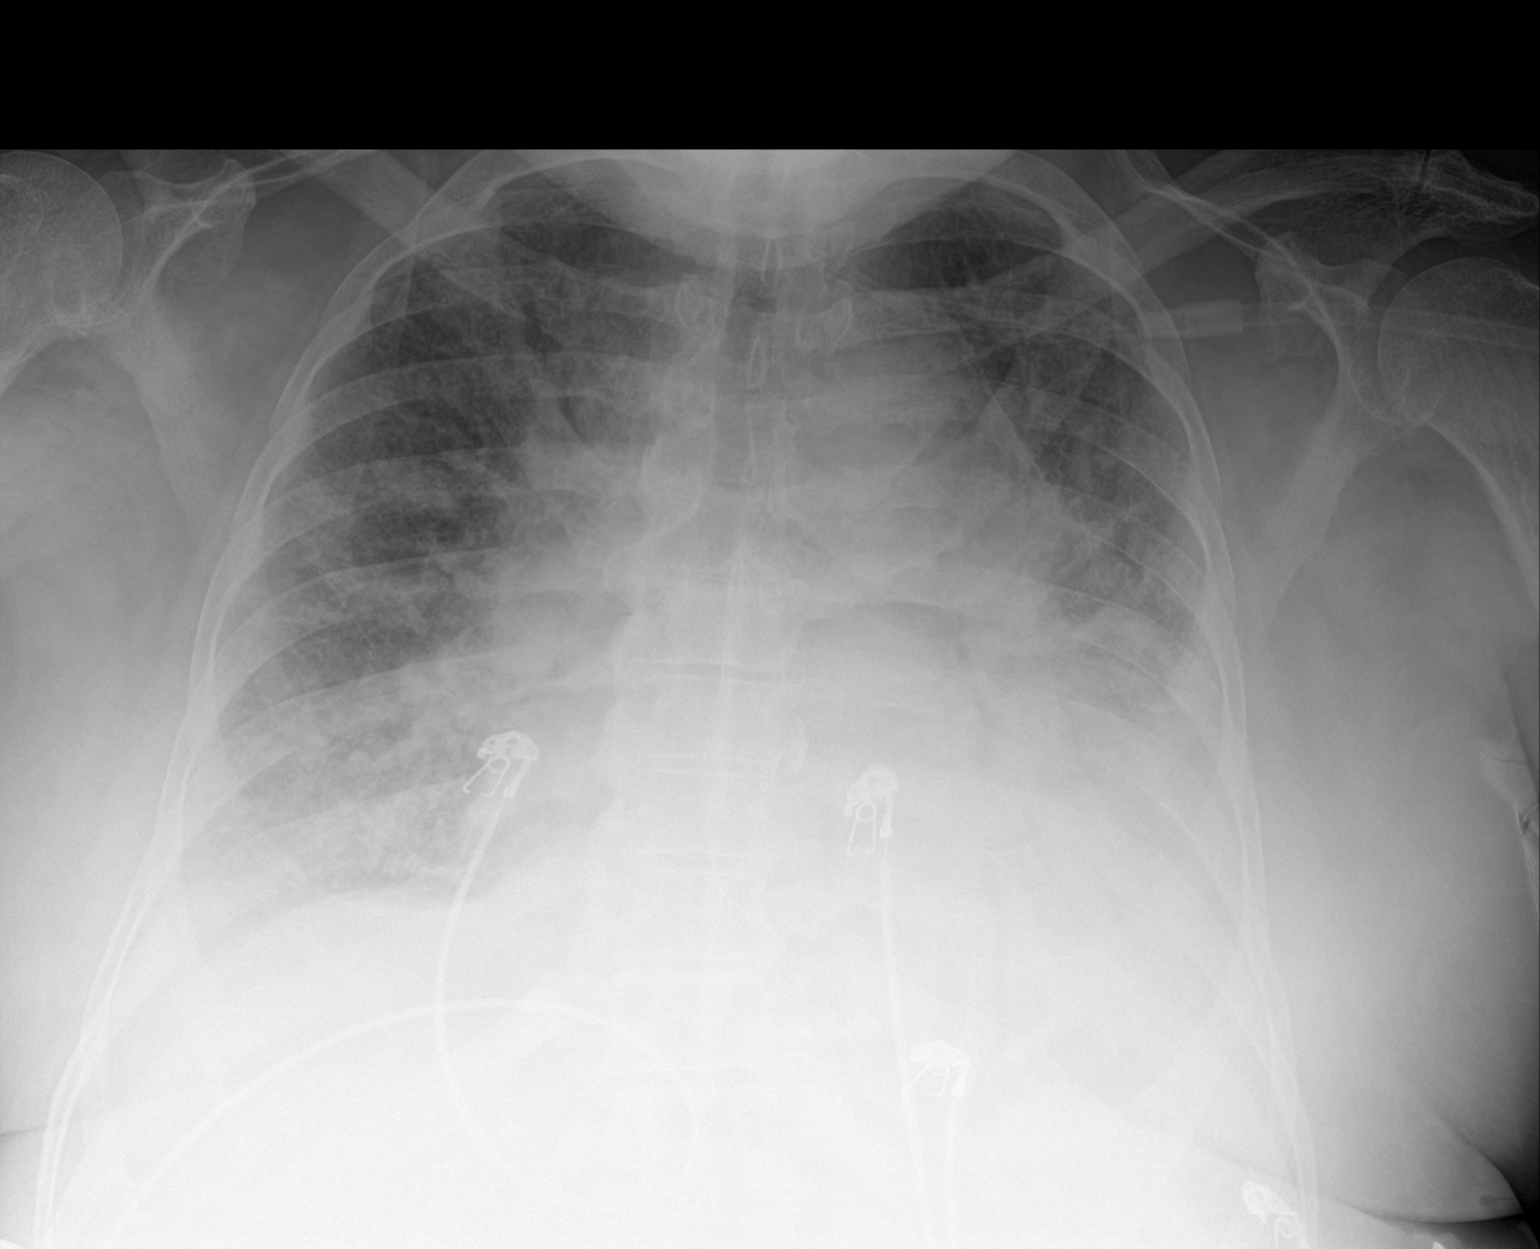

[2 of 2 positions shown; findings below may reference images not displayed]

FINDINGS: There is cardiomegaly with vascular congestion and edema. Small
bilateral pleural effusions. Overall there has been interval
worsening of the airspace densities compared to the prior
radiograph. No pneumothorax. No acute osseous pathology.
IMPRESSION: Cardiomegaly with findings of CHF, worsened since the study of
03/15/2018. Pneumonia is not excluded. Clinical correlation is
recommended.

## 2019-10-05 IMAGING — DX DG CHEST 1V PORT
1 series · 1 of 1 positions shown · non-contrast
Comparison: Chest radiograph 04/30/2018

CLINICAL DATA: Patient with history of congestive heart failure

EXAM:
PORTABLE CHEST 1 VIEW

[chest ap]
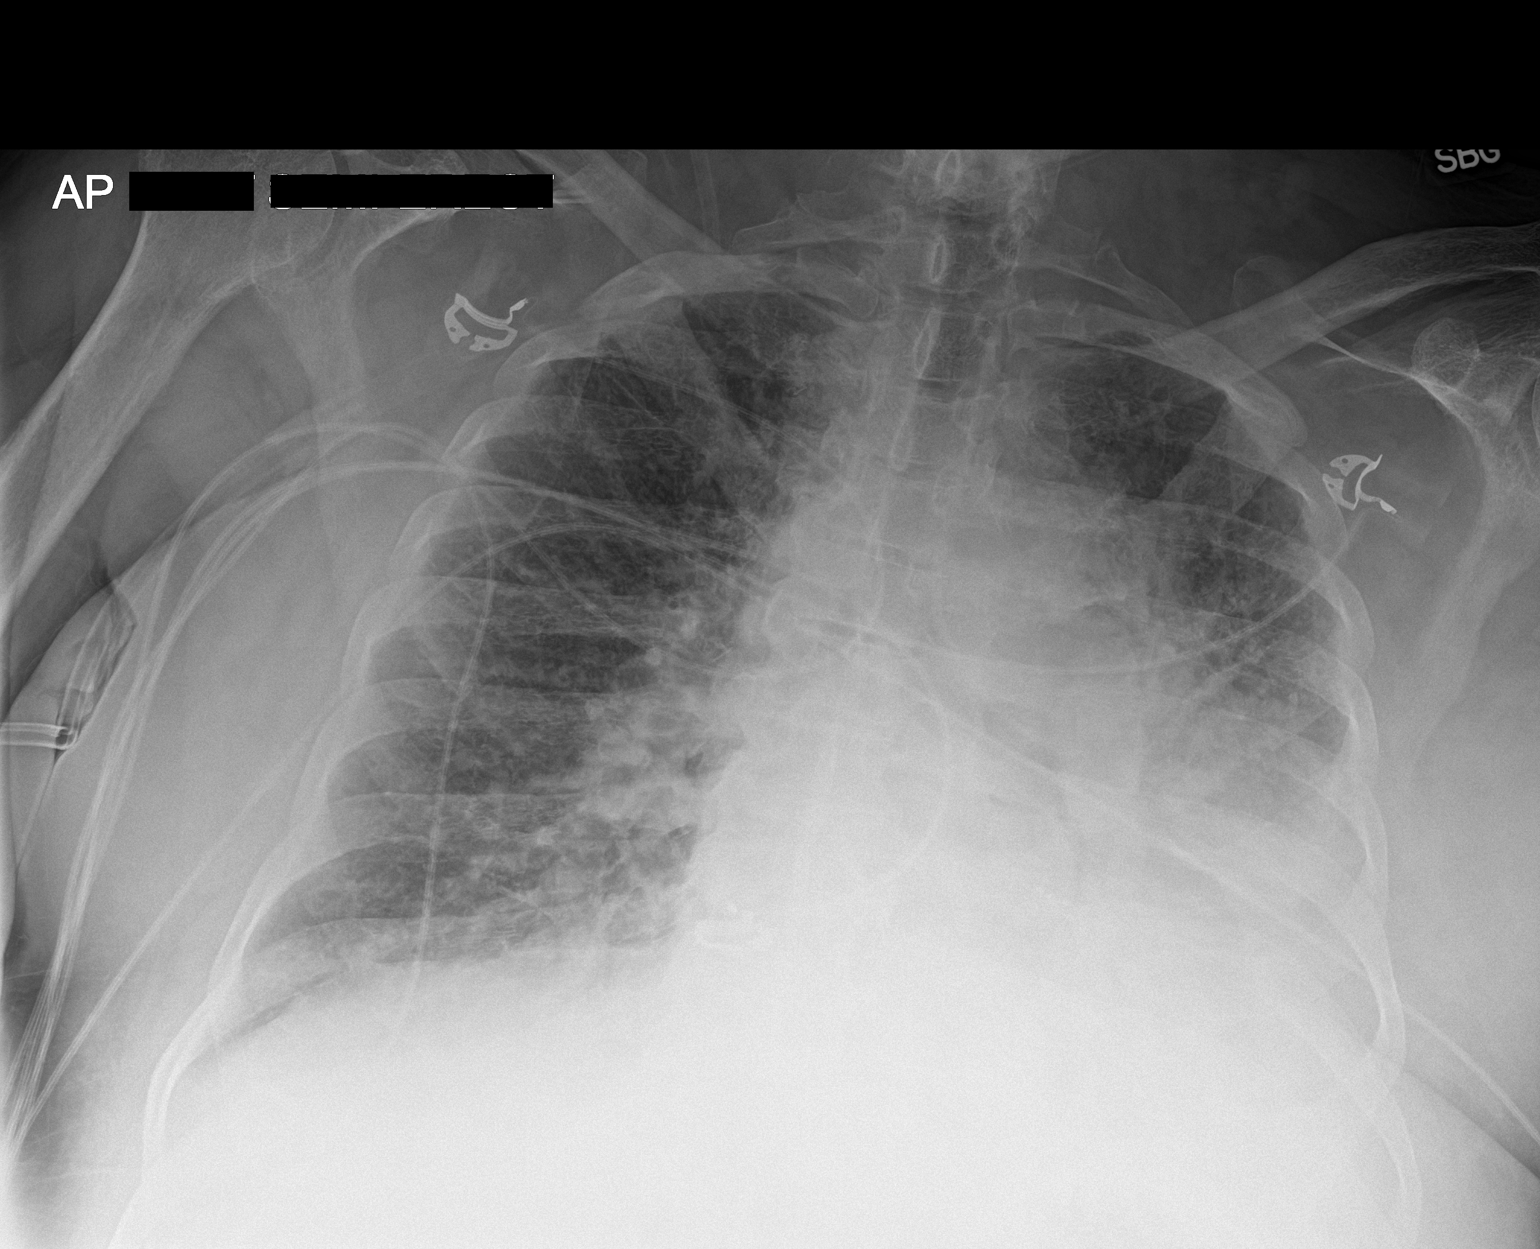

[1 of 1 positions shown; findings below may reference images not displayed]

FINDINGS: Monitoring leads overlie the patient. Stable cardiomegaly. Similar
to mildly improved diffuse bilateral interstitial pulmonary
opacities. Retrocardiac consolidation may represent pleural effusion
and atelectasis.
IMPRESSION: Cardiomegaly and pulmonary edema, similar to mildly improved from
prior. Probable left pleural effusion and underlying atelectasis.

## 2019-10-10 DIAGNOSIS — Z79891 Long term (current) use of opiate analgesic: Secondary | ICD-10-CM | POA: Diagnosis not present

## 2019-10-11 ENCOUNTER — Telehealth (HOSPITAL_COMMUNITY): Payer: Self-pay

## 2019-10-11 NOTE — Telephone Encounter (Signed)
Pt answered the phone today regarding our appointment for today, pts wife wants to resch as she is leaving out around the time of our appointment and would like for me to come tomor.   Marylouise Stacks, EMT-Paramedic  10/11/19

## 2019-10-12 ENCOUNTER — Other Ambulatory Visit (HOSPITAL_COMMUNITY): Payer: Self-pay

## 2019-10-12 NOTE — Progress Notes (Signed)
Paramedicine Encounter    Patient ID: Nicholas Caldwell, male    DOB: 1946/09/09, 73 y.o.   MRN: [id number]   Patient Care Team: [name], MD as PCP - General (Internal Medicine) Jettie Booze, MD as Consulting Physician (Cardiology) Sharmon Revere as Physician Assistant (Cardiology) [name], MD as Attending Physician (Internal Medicine) Thompson Grayer, MD as Consulting Physician (Clinical Cardiac Electrophysiology) [name], MD (Internal Medicine)  Patient Active Problem List   Diagnosis Date Noted  . Acute respiratory failure with hypercapnia ([place])   . Encounter for intubation   . Pressure injury of skin 08/21/2019  . Cardiac arrest ([place]) 08/20/2019  . Palliative care by specialist   . DNR (do not resuscitate) discussion   . Fatigue   . Acute CHF (congestive heart failure) ([place]) 07/07/2019  . Acute on chronic heart failure ([place]) 06/09/2019  . Lobar pneumonia ([place]) 04/14/2019  . Hyperkalemia 04/10/2019  . Cerebral embolism with cerebral infarction 03/21/2019  . Gastritis and gastroduodenitis   . NSVT (nonsustained ventricular tachycardia) ([place]) 03/08/2019  . Tobacco abuse counseling 03/08/2019  . Iron deficiency anemia   . Acute on chronic systolic CHF (congestive heart failure) ([place]) 03/06/2019  . Diabetes mellitus type 2 in obese ([place])   . Primary osteoarthritis of right hip   . Hypomagnesemia   . Steroid-induced hyperglycemia   . Supplemental oxygen dependent   . Leukocytosis   . Hypokalemia   . Acute on chronic anemia   . Diabetic peripheral neuropathy ([place])   . Acute on chronic systolic (congestive) heart failure ([place])   . Acute on chronic respiratory failure ([place]) 01/14/2019  . Hypoxia   . Altered mental status   . Heart failure ([place]) 12/30/2018  . AKI (acute kidney injury) ([place])   . Acute respiratory failure ([place]) 11/22/2018  . Acute on chronic respiratory failure with hypoxia and hypercapnia ([place]) 11/13/2018  . Metabolic  encephalopathy 99/37/1696  . Acute respiratory failure with hypoxia and hypercapnia ([place]) 07/26/2018  . Acute exacerbation of CHF (congestive heart failure) ([place]) 07/26/2018  . CHF exacerbation ([place]) 05/01/2018  . CHF (congestive heart failure) ([place]) 04/30/2018  . Chronic respiratory failure with hypoxia ([place]) 12/28/2017  . Acute encephalopathy   . Atrial fibrillation with RVR ([place])   . SVT (supraventricular tachycardia) ([place])   . COPD GOLD 0   . Medically noncompliant   . Panlobular emphysema ([place])   . OSA (obstructive sleep apnea)   . Pulmonary hypertension ([place])   . Disorientation   . Pressure injury of skin 09/07/2016  . Acute on chronic combined systolic and diastolic CHF (congestive heart failure) ([place]) 09/05/2016  . Skin lesion-left heal 09/05/2016  . Chronic systolic CHF (congestive heart failure) ([place]) 07/16/2016  . COPD (chronic obstructive pulmonary disease) ([place]) 07/16/2016  . Uncontrolled type 2 diabetes mellitus with complication ([place])   . Diabetic polyneuropathy associated with diabetes mellitus due to underlying condition ([place])   . Normocytic anemia 06/29/2016  . CAD - Non-obstructive by LHC1/16 01/21/2016  . Prolonged QT interval 10/09/2015  . Nonischemic cardiomyopathy ([place]) 10/09/2015  . PAF (paroxysmal atrial fibrillation) ([place])   . Chronic pain 02/19/2015  . Morbid obesity ([place]) 02/13/2015  . Dyspnea   . Elevated troponin I level 11/01/2014  . COPD exacerbation ([place]) 11/01/2014  . Essential hypertension 10/31/2014  . Type 2 diabetes mellitus with neuropathy 10/31/2014  . Hyperlipidemia  10/31/2014  . Cigarette smoker 10/31/2014    Current Outpatient Medications:  .  albuterol (VENTOLIN HFA) 108 (90 Base)  MCG/ACT inhaler, Inhale 2 puffs into the lungs every 6 (six) hours as needed for wheezing or shortness of breath., Disp: , Rfl:  .  amiodarone (PACERONE) 200 MG tablet, Take 200 mg by mouth daily., Disp: , Rfl:  .  apixaban (ELIQUIS) 5 MG TABS tablet,  Take 1 tablet (5 mg total) by mouth 2 (two) times daily., Disp: 60 tablet, Rfl: 1 .  budesonide-formoterol (SYMBICORT) 160-4.5 MCG/ACT inhaler, Inhale 2 puffs into the lungs as needed (shortness of breath). , Disp: , Rfl:  .  Ensure Max Protein (ENSURE MAX PROTEIN) LIQD, Take 330 mLs (11 oz total) by mouth daily., Disp:  , Rfl:  .  famotidine (PEPCID) 20 MG tablet, Take 1 tablet (20 mg total) by mouth 2 (two) times daily., Disp: 60 tablet, Rfl: 0 .  FEROSUL 325 (65 Fe) MG tablet, Take 325 mg by mouth 2 (two) times daily., Disp: , Rfl:  .  fluticasone (FLONASE) 50 MCG/ACT nasal spray, Place 2 sprays into both nostrils daily as needed for allergies. , Disp: , Rfl:  .  gabapentin (NEURONTIN) 300 MG capsule, Take 1 capsule (300 mg total) by mouth 2 (two) times daily., Disp: 60 capsule, Rfl: 0 .  hydrocortisone (ANUSOL-HC) 2.5 % rectal cream, Place 1 application rectally 2 (two) times daily as needed for hemorrhoids or itching. , Disp: , Rfl:  .  hydrocortisone 1 % lotion, Apply 1 application topically See admin instructions. Apply small amount to back twice daily for itchy rash as needed, Disp: , Rfl:  .  insulin aspart (NOVOLOG) 100 UNIT/ML injection, Inject 4 Units into the skin 3 (three) times daily with meals., Disp: 10 mL, Rfl: 0 .  insulin aspart protamine - aspart (NOVOLOG MIX 70/30 FLEXPEN) (70-30) 100 UNIT/ML FlexPen, Inject 0.1 mLs (10 Units total) into the skin 2 (two) times daily., Disp: 15 mL, Rfl: 0 .  Insulin Syringe 27G X 1/2" 0.5 ML MISC, 100 Syringes by Does not apply route 3 (three) times daily., Disp: 100 each, Rfl: 1 .  ipratropium-albuterol (DUONEB) 0.5-2.5 (3) MG/3ML SOLN, Take 3 mLs by nebulization every 6 (six) hours as needed (shortness of breath/wheezing)., Disp: 360 mL, Rfl: 0 .  ipratropium-albuterol (DUONEB) 0.5-2.5 (3) MG/3ML SOLN, Take 3 mLs by nebulization every 6 (six) hours as needed., Disp: 360 mL, Rfl: 3 .  metolazone (ZAROXOLYN) 2.5 MG tablet, Take 2.5 mg by mouth  every Tuesday, Thursday, Saturday, and Sunday., Disp: , Rfl:  .  oxybutynin (DITROPAN) 5 MG tablet, Take 1 tablet (5 mg total) by mouth 4 (four) times daily., Disp: 120 tablet, Rfl: 0 .  oxyCODONE-acetaminophen (PERCOCET) 10-325 MG tablet, Take 1 tablet by mouth every 6 (six) hours as needed for pain. , Disp: , Rfl:  .  OXYGEN, Inhale 3 L into the lungs continuous. , Disp: , Rfl:  .  polyethylene glycol (MIRALAX / GLYCOLAX) 17 g packet, Take 17 g by mouth daily as needed., Disp: 14 each, Rfl: 0 .  potassium chloride SA (K-DUR) 20 MEQ tablet, Take 3 tablets (60 mEq total) by mouth 2 (two) times daily. With additional 78mq when you take Metolazone., Disp: 90 tablet, Rfl: 3 .  PRESCRIPTION MEDICATION, Cpap, Disp: , Rfl:  .  rosuvastatin (CRESTOR) 20 MG tablet, Take 20 mg by mouth at bedtime., Disp: , Rfl:  .  senna-docusate (SENOKOT-S) 8.6-50 MG tablet, Take 1 tablet by mouth at bedtime as needed for mild constipation., Disp: 30 tablet, Rfl: 0 .  sodium chloride (OCEAN) 0.65 % SOLN nasal  spray, Place 2 sprays into both nostrils as needed for congestion. , Disp: , Rfl:  .  spironolactone (ALDACTONE) 25 MG tablet, Take 1 tablet (25 mg total) by mouth daily., Disp: 30 tablet, Rfl: 6 .  torsemide (DEMADEX) 20 MG tablet, Take 5 tablets (100 mg total) by mouth 2 (two) times daily., Disp: 150 tablet, Rfl: 3 .  pantoprazole (PROTONIX) 40 MG tablet, Take 1 tablet (40 mg total) by mouth 2 (two) times daily. (Patient not taking: Reported on 09/28/2019), Disp: 60 tablet, Rfl: 0 .  polyethylene glycol (MIRALAX / GLYCOLAX) packet, Take 17 g by mouth daily. (Patient not taking: Reported on 10/12/2019), Disp: 14 each, Rfl: 0 .  tamsulosin (FLOMAX) 0.4 MG CAPS capsule, Take 1 capsule (0.4 mg total) by mouth daily after supper. (Patient not taking: Reported on 09/28/2019), Disp: 30 capsule, Rfl: 3 No Known Allergies    Social History   Socioeconomic History  . Marital status: Married    Spouse name: Not on file   . Number of children: Not on file  . Years of education: Not on file  . Highest education level: Not on file  Occupational History  . Not on file  Social Needs  . Financial resource strain: Not on file  . Food insecurity    Worry: Not on file    Inability: Not on file  . Transportation needs    Medical: Not on file    Non-medical: Not on file  Tobacco Use  . Smoking status: Former Smoker    Packs/day: 1.00    Years: 34.00    Pack years: 34.00    Types: Cigarettes    Quit date: 11/30/2018    Years since quitting: 0.8  . Smokeless tobacco: Never Used  Substance and Sexual Activity  . Alcohol use: No    Alcohol/week: 0.0 standard drinks  . Drug use: No  . Sexual activity: Not on file  Lifestyle  . Physical activity    Days per week: Not on file    Minutes per session: Not on file  . Stress: Not on file  Relationships  . Social Herbalist on phone: Not on file    Gets together: Not on file    Attends religious service: Not on file    Active member of club or organization: Not on file    Attends meetings of clubs or organizations: Not on file    Relationship status: Not on file  . Intimate partner violence    Fear of current or ex partner: Not on file    Emotionally abused: Not on file    Physically abused: Not on file    Forced sexual activity: Not on file  Other Topics Concern  . Not on file  Social History Narrative   ** Merged History Encounter **        Physical Exam      Future Appointments  Date Time Provider [place]  11/01/2019  2:40 PM Larey Dresser, MD [facility] None    BP (!) 126/56   Pulse 76   Resp 18   Wt 241 lb (109.3 kg)   SpO2 98%   BMI 33.61 kg/m   Weight yesterday-242 Last visit weight-247  Pt is c/o constipation past few days. He had BM this morning but he said it was only very small amount.  Pt denies bleeding issues when having BM.  He denies abd pain. Denies bloating. Appetite still good. No n/v/d. He  states  good urine output, light yellow/clear.  Will see if he can be [facility]--[facility] said he called the [place] but they dont have anything march.  He states he is able to walk further, breathe easier and feels better with the fluid off of him. He is still taking his oxy approx 8x a week.  Nurse came out today and wrapped his legs.  No edema noted-his legs are little. Best ive seen his legs in a very long time.  Lungs still have wheezing. He has not used his neb today.  meds reviewed. He asked I fill pill boxes rather than his wife.   Marylouise Stacks, [facility] Paramedic  10/12/19

## 2019-10-26 ENCOUNTER — Other Ambulatory Visit (HOSPITAL_COMMUNITY): Payer: Self-pay

## 2019-10-26 NOTE — Progress Notes (Signed)
Paramedicine Encounter    Patient ID: Nicholas Caldwell, male    DOB: 1946/09/09, 73 y.o.   MRN: [id number]   Patient Care Team: [name], MD as PCP - General (Internal Medicine) Jettie Booze, MD as Consulting Physician (Cardiology) Sharmon Revere as Physician Assistant (Cardiology) [name], MD as Attending Physician (Internal Medicine) Thompson Grayer, MD as Consulting Physician (Clinical Cardiac Electrophysiology) [name], MD (Internal Medicine)  Patient Active Problem List   Diagnosis Date Noted  . Acute respiratory failure with hypercapnia ([place])   . Encounter for intubation   . Pressure injury of skin 08/21/2019  . Cardiac arrest ([place]) 08/20/2019  . Palliative care by specialist   . DNR (do not resuscitate) discussion   . Fatigue   . Acute CHF (congestive heart failure) ([place]) 07/07/2019  . Acute on chronic heart failure ([place]) 06/09/2019  . Lobar pneumonia ([place]) 04/14/2019  . Hyperkalemia 04/10/2019  . Cerebral embolism with cerebral infarction 03/21/2019  . Gastritis and gastroduodenitis   . NSVT (nonsustained ventricular tachycardia) ([place]) 03/08/2019  . Tobacco abuse counseling 03/08/2019  . Iron deficiency anemia   . Acute on chronic systolic CHF (congestive heart failure) ([place]) 03/06/2019  . Diabetes mellitus type 2 in obese ([place])   . Primary osteoarthritis of right hip   . Hypomagnesemia   . Steroid-induced hyperglycemia   . Supplemental oxygen dependent   . Leukocytosis   . Hypokalemia   . Acute on chronic anemia   . Diabetic peripheral neuropathy ([place])   . Acute on chronic systolic (congestive) heart failure ([place])   . Acute on chronic respiratory failure ([place]) 01/14/2019  . Hypoxia   . Altered mental status   . Heart failure ([place]) 12/30/2018  . AKI (acute kidney injury) ([place])   . Acute respiratory failure ([place]) 11/22/2018  . Acute on chronic respiratory failure with hypoxia and hypercapnia ([place]) 11/13/2018  . Metabolic  encephalopathy 99/37/1696  . Acute respiratory failure with hypoxia and hypercapnia ([place]) 07/26/2018  . Acute exacerbation of CHF (congestive heart failure) ([place]) 07/26/2018  . CHF exacerbation ([place]) 05/01/2018  . CHF (congestive heart failure) ([place]) 04/30/2018  . Chronic respiratory failure with hypoxia ([place]) 12/28/2017  . Acute encephalopathy   . Atrial fibrillation with RVR ([place])   . SVT (supraventricular tachycardia) ([place])   . COPD GOLD 0   . Medically noncompliant   . Panlobular emphysema ([place])   . OSA (obstructive sleep apnea)   . Pulmonary hypertension ([place])   . Disorientation   . Pressure injury of skin 09/07/2016  . Acute on chronic combined systolic and diastolic CHF (congestive heart failure) ([place]) 09/05/2016  . Skin lesion-left heal 09/05/2016  . Chronic systolic CHF (congestive heart failure) ([place]) 07/16/2016  . COPD (chronic obstructive pulmonary disease) ([place]) 07/16/2016  . Uncontrolled type 2 diabetes mellitus with complication ([place])   . Diabetic polyneuropathy associated with diabetes mellitus due to underlying condition ([place])   . Normocytic anemia 06/29/2016  . CAD - Non-obstructive by LHC1/16 01/21/2016  . Prolonged QT interval 10/09/2015  . Nonischemic cardiomyopathy ([place]) 10/09/2015  . PAF (paroxysmal atrial fibrillation) ([place])   . Chronic pain 02/19/2015  . Morbid obesity ([place]) 02/13/2015  . Dyspnea   . Elevated troponin I level 11/01/2014  . COPD exacerbation ([place]) 11/01/2014  . Essential hypertension 10/31/2014  . Type 2 diabetes mellitus with neuropathy 10/31/2014  . Hyperlipidemia  10/31/2014  . Cigarette smoker 10/31/2014    Current Outpatient Medications:  .  albuterol (VENTOLIN HFA) 108 (90 Base)  MCG/ACT inhaler, Inhale 2 puffs into the lungs every 6 (six) hours as needed for wheezing or shortness of breath., Disp: , Rfl:  .  amiodarone (PACERONE) 200 MG tablet, Take 200 mg by mouth daily., Disp: , Rfl:  .  apixaban (ELIQUIS) 5 MG TABS tablet,  Take 1 tablet (5 mg total) by mouth 2 (two) times daily., Disp: 60 tablet, Rfl: 1 .  budesonide-formoterol (SYMBICORT) 160-4.5 MCG/ACT inhaler, Inhale 2 puffs into the lungs as needed (shortness of breath). , Disp: , Rfl:  .  Ensure Max Protein (ENSURE MAX PROTEIN) LIQD, Take 330 mLs (11 oz total) by mouth daily., Disp:  , Rfl:  .  FEROSUL 325 (65 Fe) MG tablet, Take 325 mg by mouth 2 (two) times daily., Disp: , Rfl:  .  fluticasone (FLONASE) 50 MCG/ACT nasal spray, Place 2 sprays into both nostrils daily as needed for allergies. , Disp: , Rfl:  .  gabapentin (NEURONTIN) 300 MG capsule, Take 1 capsule (300 mg total) by mouth 2 (two) times daily., Disp: 60 capsule, Rfl: 0 .  hydrocortisone (ANUSOL-HC) 2.5 % rectal cream, Place 1 application rectally 2 (two) times daily as needed for hemorrhoids or itching. , Disp: , Rfl:  .  hydrocortisone 1 % lotion, Apply 1 application topically See admin instructions. Apply small amount to back twice daily for itchy rash as needed, Disp: , Rfl:  .  insulin aspart (NOVOLOG) 100 UNIT/ML injection, Inject 4 Units into the skin 3 (three) times daily with meals., Disp: 10 mL, Rfl: 0 .  insulin aspart protamine - aspart (NOVOLOG MIX 70/30 FLEXPEN) (70-30) 100 UNIT/ML FlexPen, Inject 0.1 mLs (10 Units total) into the skin 2 (two) times daily., Disp: 15 mL, Rfl: 0 .  Insulin Syringe 27G X 1/2" 0.5 ML MISC, 100 Syringes by Does not apply route 3 (three) times daily., Disp: 100 each, Rfl: 1 .  ipratropium-albuterol (DUONEB) 0.5-2.5 (3) MG/3ML SOLN, Take 3 mLs by nebulization every 6 (six) hours as needed (shortness of breath/wheezing)., Disp: 360 mL, Rfl: 0 .  ipratropium-albuterol (DUONEB) 0.5-2.5 (3) MG/3ML SOLN, Take 3 mLs by nebulization every 6 (six) hours as needed., Disp: 360 mL, Rfl: 3 .  metolazone (ZAROXOLYN) 2.5 MG tablet, Take 2.5 mg by mouth every Tuesday, Thursday, Saturday, and Sunday., Disp: , Rfl:  .  oxybutynin (DITROPAN) 5 MG tablet, Take 1 tablet (5 mg  total) by mouth 4 (four) times daily., Disp: 120 tablet, Rfl: 0 .  oxyCODONE-acetaminophen (PERCOCET) 10-325 MG tablet, Take 1 tablet by mouth every 6 (six) hours as needed for pain. , Disp: , Rfl:  .  OXYGEN, Inhale 3 L into the lungs continuous. , Disp: , Rfl:  .  polyethylene glycol (MIRALAX / GLYCOLAX) 17 g packet, Take 17 g by mouth daily as needed., Disp: 14 each, Rfl: 0 .  potassium chloride SA (K-DUR) 20 MEQ tablet, Take 3 tablets (60 mEq total) by mouth 2 (two) times daily. With additional 58mq when you take Metolazone., Disp: 90 tablet, Rfl: 3 .  PRESCRIPTION MEDICATION, Cpap, Disp: , Rfl:  .  rosuvastatin (CRESTOR) 20 MG tablet, Take 20 mg by mouth at bedtime., Disp: , Rfl:  .  sodium chloride (OCEAN) 0.65 % SOLN nasal spray, Place 2 sprays into both nostrils as needed for congestion. , Disp: , Rfl:  .  spironolactone (ALDACTONE) 25 MG tablet, Take 1 tablet (25 mg total) by mouth daily., Disp: 30 tablet, Rfl: 6 .  torsemide (DEMADEX) 20 MG tablet, Take 5 tablets (100  mg total) by mouth 2 (two) times daily., Disp: 150 tablet, Rfl: 3 .  famotidine (PEPCID) 20 MG tablet, Take 1 tablet (20 mg total) by mouth 2 (two) times daily., Disp: 60 tablet, Rfl: 2 .  pantoprazole (PROTONIX) 40 MG tablet, Take 1 tablet (40 mg total) by mouth 2 (two) times daily. (Patient not taking: Reported on 09/28/2019), Disp: 60 tablet, Rfl: 0 .  polyethylene glycol (MIRALAX / GLYCOLAX) packet, Take 17 g by mouth daily. (Patient not taking: Reported on 10/12/2019), Disp: 14 each, Rfl: 0 .  senna-docusate (SENOKOT-S) 8.6-50 MG tablet, Take 1 tablet by mouth at bedtime as needed for mild constipation. (Patient not taking: Reported on 10/26/2019), Disp: 30 tablet, Rfl: 0 .  tamsulosin (FLOMAX) 0.4 MG CAPS capsule, Take 1 capsule (0.4 mg total) by mouth daily after supper. (Patient not taking: Reported on 09/28/2019), Disp: 30 capsule, Rfl: 3 No Known Allergies    Social History   Socioeconomic History  . Marital  status: Married    Spouse name: Not on file  . Number of children: Not on file  . Years of education: Not on file  . Highest education level: Not on file  Occupational History  . Not on file  Tobacco Use  . Smoking status: Former Smoker    Packs/day: 1.00    Years: 34.00    Pack years: 34.00    Types: Cigarettes    Quit date: 11/30/2018    Years since quitting: 0.9  . Smokeless tobacco: Never Used  Substance and Sexual Activity  . Alcohol use: No    Alcohol/week: 0.0 standard drinks  . Drug use: No  . Sexual activity: Not on file  Other Topics Concern  . Not on file  Social History Narrative   ** Merged History Encounter **       Social Determinants of Health   Financial Resource Strain:   . Difficulty of Paying Living Expenses: Not on file  Food Insecurity:   . Worried About Charity fundraiser in the Last Year: Not on file  . Ran Out of Food in the Last Year: Not on file  Transportation Needs:   . Lack of Transportation (Medical): Not on file  . Lack of Transportation (Non-Medical): Not on file  Physical Activity:   . Days of Exercise per Week: Not on file  . Minutes of Exercise per Session: Not on file  Stress:   . Feeling of Stress : Not on file  Social Connections:   . Frequency of Communication with Friends and Family: Not on file  . Frequency of Social Gatherings with Friends and Family: Not on file  . Attends Religious Services: Not on file  . Active Member of Clubs or Organizations: Not on file  . Attends Archivist Meetings: Not on file  . Marital Status: Not on file  Intimate Partner Violence:   . Fear of Current or Ex-Partner: Not on file  . Emotionally Abused: Not on file  . Physically Abused: Not on file  . Sexually Abused: Not on file    Physical Exam      Future Appointments  Date Time Provider [place]  11/01/2019  2:40 PM Larey Dresser, MD [facility] None    BP 122/70   Pulse 82   Resp 18   Wt 246 lb (111.6 kg)    SpO2 97%   BMI 34.31 kg/m   Weight yesterday-244 Last visit weight-241  pts wife reports he is doing pretty good.  He had been constipated and tried the laxative and it gave him diarrhea and then he had to take maalox for that but now that is worked itself out.  meds verified and pill box refilled.  He was in the bathroom when I arrived.  He denies increased sob. No dizziness. Pt went to [place] doc this week and he was very upset about what the doc told him--he said the doc told him he had a bad heart valve leak and that was the cause of his internal abd bleeding ???? He is upset that his heart doc "didn't catch that" and that's why he died and hes mad the providers are contributing everything to his COPD or the pain pills. He needs a full talk and deep explanation of his medical issues and blamed the docs for putting him on bidil that nearly "killed him".  He was quite angry about the whole thing.  I told him to write down any questions he has and address them with the clinic next week so he can fully understand what is going on.    Marylouise Stacks, [facility] Paramedic  10/31/19

## 2019-10-27 ENCOUNTER — Other Ambulatory Visit (HOSPITAL_COMMUNITY): Payer: Self-pay

## 2019-10-27 MED ORDER — FAMOTIDINE 20 MG PO TABS: 20.0000 mg | ORAL_TABLET | Freq: Two times a day (BID) | ORAL | 2 refills | Status: DC

## 2019-11-01 ENCOUNTER — Other Ambulatory Visit (HOSPITAL_COMMUNITY): Payer: Self-pay

## 2019-11-01 ENCOUNTER — Ambulatory Visit (HOSPITAL_COMMUNITY)
Admission: RE | Admit: 2019-11-01 | Discharge: 2019-11-01 | Disposition: A | Discharge status: Home / Self Care | Payer: Medicare Other | Source: Ambulatory Visit | Attending: Cardiology | Admitting: Cardiology

## 2019-11-01 ENCOUNTER — Encounter (HOSPITAL_COMMUNITY): Payer: Self-pay | Admitting: Cardiology

## 2019-11-01 ENCOUNTER — Other Ambulatory Visit: Payer: Self-pay

## 2019-11-01 VITALS — BP 102/50 | HR 91

## 2019-11-01 DIAGNOSIS — I130 Hypertensive heart and chronic kidney disease with heart failure and stage 1 through stage 4 chronic kidney disease, or unspecified chronic kidney disease: Secondary | ICD-10-CM | POA: Diagnosis not present

## 2019-11-01 DIAGNOSIS — J449 Chronic obstructive pulmonary disease, unspecified: Secondary | ICD-10-CM | POA: Diagnosis not present

## 2019-11-01 DIAGNOSIS — Z794 Long term (current) use of insulin: Secondary | ICD-10-CM | POA: Diagnosis not present

## 2019-11-01 DIAGNOSIS — Z79899 Other long term (current) drug therapy: Secondary | ICD-10-CM | POA: Diagnosis not present

## 2019-11-01 DIAGNOSIS — E785 Hyperlipidemia, unspecified: Secondary | ICD-10-CM | POA: Diagnosis not present

## 2019-11-01 DIAGNOSIS — E1122 Type 2 diabetes mellitus with diabetic chronic kidney disease: Secondary | ICD-10-CM | POA: Diagnosis not present

## 2019-11-01 DIAGNOSIS — R300 Dysuria: Secondary | ICD-10-CM | POA: Diagnosis not present

## 2019-11-01 DIAGNOSIS — I5042 Chronic combined systolic (congestive) and diastolic (congestive) heart failure: Secondary | ICD-10-CM | POA: Diagnosis not present

## 2019-11-01 DIAGNOSIS — Z87891 Personal history of nicotine dependence: Secondary | ICD-10-CM | POA: Insufficient documentation

## 2019-11-01 DIAGNOSIS — Z8674 Personal history of sudden cardiac arrest: Secondary | ICD-10-CM | POA: Diagnosis not present

## 2019-11-01 DIAGNOSIS — I509 Heart failure, unspecified: Secondary | ICD-10-CM | POA: Diagnosis present

## 2019-11-01 DIAGNOSIS — N183 Chronic kidney disease, stage 3 unspecified: Secondary | ICD-10-CM | POA: Diagnosis not present

## 2019-11-01 DIAGNOSIS — Z7901 Long term (current) use of anticoagulants: Secondary | ICD-10-CM | POA: Diagnosis not present

## 2019-11-01 DIAGNOSIS — I480 Paroxysmal atrial fibrillation: Secondary | ICD-10-CM | POA: Diagnosis not present

## 2019-11-01 DIAGNOSIS — N390 Urinary tract infection, site not specified: Secondary | ICD-10-CM | POA: Diagnosis not present

## 2019-11-01 DIAGNOSIS — Z9981 Dependence on supplemental oxygen: Secondary | ICD-10-CM | POA: Diagnosis not present

## 2019-11-01 DIAGNOSIS — E662 Morbid (severe) obesity with alveolar hypoventilation: Secondary | ICD-10-CM | POA: Insufficient documentation

## 2019-11-01 DIAGNOSIS — F17200 Nicotine dependence, unspecified, uncomplicated: Secondary | ICD-10-CM

## 2019-11-01 DIAGNOSIS — I5022 Chronic systolic (congestive) heart failure: Secondary | ICD-10-CM | POA: Diagnosis not present

## 2019-11-01 DIAGNOSIS — I428 Other cardiomyopathies: Secondary | ICD-10-CM | POA: Diagnosis not present

## 2019-11-01 DIAGNOSIS — Z8249 Family history of ischemic heart disease and other diseases of the circulatory system: Secondary | ICD-10-CM | POA: Diagnosis not present

## 2019-11-01 DIAGNOSIS — Z8673 Personal history of transient ischemic attack (TIA), and cerebral infarction without residual deficits: Secondary | ICD-10-CM | POA: Insufficient documentation

## 2019-11-01 DIAGNOSIS — I2510 Atherosclerotic heart disease of native coronary artery without angina pectoris: Secondary | ICD-10-CM | POA: Insufficient documentation

## 2019-11-01 LAB — COMPREHENSIVE METABOLIC PANEL
ALT: 18 U/L (ref 0–44)
AST: 29 U/L (ref 15–41)
Albumin: 3.5 g/dL (ref 3.5–5.0)
Alkaline Phosphatase: 107 U/L (ref 38–126)
Anion gap: 16 — ABNORMAL HIGH (ref 5–15)
BUN: 64 mg/dL — ABNORMAL HIGH (ref 8–23)
CO2: 30 mmol/L (ref 22–32)
Calcium: 9.2 mg/dL (ref 8.9–10.3)
Chloride: 90 mmol/L — ABNORMAL LOW (ref 98–111)
Creatinine, Ser: 2.86 mg/dL — ABNORMAL HIGH (ref 0.61–1.24)
GFR calc Af Amer: 25 mL/min — ABNORMAL LOW (ref >60)
GFR calc non Af Amer: 21 mL/min — ABNORMAL LOW (ref >60)
Glucose, Bld: 151 mg/dL — ABNORMAL HIGH (ref 70–99)
Potassium: 3.7 mmol/L (ref 3.5–5.1)
Sodium: 136 mmol/L (ref 135–145)
Total Bilirubin: 0.3 mg/dL (ref 0.3–1.2)
Total Protein: 7.9 g/dL (ref 6.5–8.1)

## 2019-11-01 LAB — CBC
HCT: 37.2 % — ABNORMAL LOW (ref 39.0–52.0)
Hemoglobin: 11.5 g/dL — ABNORMAL LOW (ref 13.0–17.0)
MCH: 26.7 pg (ref 26.0–34.0)
MCHC: 30.9 g/dL (ref 30.0–36.0)
MCV: 86.3 fL (ref 80.0–100.0)
Platelets: 453 10*3/uL — ABNORMAL HIGH (ref 150–400)
RBC: 4.31 MIL/uL (ref 4.22–5.81)
RDW: 18.1 % — ABNORMAL HIGH (ref 11.5–15.5)
WBC: 9.8 10*3/uL (ref 4.0–10.5)
nRBC: 0 % (ref 0.0–0.2)

## 2019-11-01 LAB — IRON AND TIBC
Iron: 41 ug/dL — ABNORMAL LOW (ref 45–182)
Saturation Ratios: 10 % — ABNORMAL LOW (ref 17.9–39.5)
TIBC: 409 ug/dL (ref 250–450)
UIBC: 368 ug/dL

## 2019-11-01 LAB — FERRITIN: Ferritin: 52 ng/mL (ref 24–336)

## 2019-11-01 LAB — TSH: TSH: 1.652 u[IU]/mL (ref 0.350–4.500)

## 2019-11-01 MED ORDER — AMIODARONE HCL 100 MG PO TABS: 100.0000 mg | ORAL_TABLET | Freq: Every day | ORAL | 5 refills | Status: DC

## 2019-11-01 NOTE — Progress Notes (Signed)
Heart Failure  Note Date:  11/01/2019   ID:  [name], DOB Mar 03, 1946, MRN [id number]   [place] location: [place]  Advanced Heart Failure Type of Visit: Established patient  PCP:  [name], MD  HF Cardiology: Dr [name]   Chief Complaint: Heart Failure    History of Present Illness: Nicholas Caldwell is a 73 y.o. male with a history of chronic systolic CHFdue to NICM (EF 40-45%)with moderate RV failure, COPD on 3 L O2, DM, HTN, OHS/OSA on CPAP, PAF s/p DCCV, hyperlipidemia, CAD, and noncompliance.He has multiple admissions due to HF and respiratory failure.   Most recently admitted from 03/07/19-03/31/19 with massive volume overload with severe RV failure, AFL and cardiorenal syndrome. Attempts at diuresis limited by worsening creatinine which peaked at 2.6. Treated with milrinone but still unable to get CVP below 15. Hospital course c/b severe non-compliance with high fluid intake, PAFL, penile wound, acute versus subacute CVA and Citrobacter UTI. Underwent DC-CV of AFL into NSR.  D/c'd home on 5/28 with weight of 270 and creatinine 1.99. Readmitted6/6/2020with recurrent hypoxic/hypercarbic respiratory failure. (ABG 7.27/66/55/86%). Found to have severe hyperkalemia (K>7.5) and large left pleural effusion. Weightwasup about 10 pounds since d/c.Diuresed with IV lasix and later transitioned to torsemide 80 mg twice a day.  Discharge weight on 04/14/2019 was 251 pounds.   Admitted 05/07/04 with A/C systolic/diastolic heart failure. Bisoprolol was stopped on admit and not continued on discharge. Diuresed with with IV lasix and transitioned back to torsemide 80 mg twice a day + metolazone 4 times a week.   Admitted 1/0/71 with A/C systolic/diastolic heart failure + AECOPD. Diuresed with IV lasix and discharged on torsemide 80 mg twice a day + metolazone 4x a week. Treated with steroids and antibiotics. He was placed on low dose bisoprolol.  Palliative Care consulted. MOST form was  introduced. He wished to continue full code.   He was admitted in 10/20 with RLL PNA (treated with ceftriaxone), CHF exacerbation, and right pleural effusion.  He had right-sided thoracentesis.  He was admitted again in 10/20 with PEA arrest, thought to be due to overuse of narcotic pain meds and underuse of Bipap.  He was noted to be in atrial fibrillation and amiodarone was started, he went back into NSR. He was diuresed for CHF.   Today he returns for followup of CHF.  He is stable symptomatically.  Not walking much due to hip pain, came in today in wheelchair.  Main complaints are of constipation and odor/burning with urination.  Ok generally if he "paces himself" while walking in the house. Short of breath if he tries to rush. Chronic orthopnea.  Legs are wrapped.   ECG (personally reviewed): NSR, RBBB  Labs (10/20): K 3.3, creatinine 1.7 Labs (11/20): K 3.9, creatinine 2.21, hgb 10.1  PMH: 1. Chronic systolic CHF: Nonischemic cardiomyopathy, diagnosed in 2016.  - LHC (1/16) with mild nonobstructive CAD.  - Echo (12/17): EF 35-40%, mildly dilated RV with mild to moderately decreased RV systolic function, PASP 57 mmHg.  - Echo (3/20): EF 45-50%, mild LVH, D-shaped septum with severe RV dilation and RV function.  - TEE (3/20): EF 40-45%, moderate LVH, severe RV dilation with moderate dysfunction.  - RHC (2/20): mean RA 10, PA 52/16 mean 30, mean PCWP 10, CI 3.85, PVR 2.2 WU - Echo (9/20): EF >65%, moderate RV dilation with mildly decreased systolic function.  - Echo (10/20): EF 60-65%, severe RV dilation with severely decreased RV systolic function.  2. Atrial  fibrillation: Paroxysmal. Severe LAE, has seen Dr Rayann Heman and thought to have low chance of success with atrial fibrillation ablation. Tikosyn not used due to long QT. He was on amiodarone in the past but this was stopped due to long QT.  - DCCV 3/20, 5/20.  3. COPD: On 3 L home oxygen.  4. Type II diabetes 5. Hyperlipidemia 6.  HTN 7. OHS/OSA: Home oxygen + CPAP.  8. CAD: LHC (2/20) with 50-60% distal LAD, 50% proximal LCx, 50% proximal RCA (nondominant RCA).  9. CVA: 5/20.  10. CKD: Stage 3.  11. ABIs (9/20): normal.  12. PEA arrest: 10/20, due to overuse of narcotic pain meds and underuse of Bipap.  13. Tremor  Current Outpatient Medications  Medication Sig Dispense Refill  . albuterol (VENTOLIN HFA) 108 (90 Base) MCG/ACT inhaler Inhale 2 puffs into the lungs every 6 (six) hours as needed for wheezing or shortness of breath.    Marland Kitchen amiodarone (PACERONE) 100 MG tablet Take 1 tablet (100 mg total) by mouth daily. 30 tablet 5  . apixaban (ELIQUIS) 5 MG TABS tablet Take 1 tablet (5 mg total) by mouth 2 (two) times daily. 60 tablet 1  . budesonide-formoterol (SYMBICORT) 160-4.5 MCG/ACT inhaler Inhale 2 puffs into the lungs as needed (shortness of breath).     . Ensure Max Protein (ENSURE MAX PROTEIN) LIQD Take 330 mLs (11 oz total) by mouth daily.    . famotidine (PEPCID) 20 MG tablet Take 1 tablet (20 mg total) by mouth 2 (two) times daily. 60 tablet 2  . FEROSUL 325 (65 Fe) MG tablet Take 325 mg by mouth 2 (two) times daily.    . fluticasone (FLONASE) 50 MCG/ACT nasal spray Place 2 sprays into both nostrils daily as needed for allergies.     Marland Kitchen gabapentin (NEURONTIN) 300 MG capsule Take 1 capsule (300 mg total) by mouth 2 (two) times daily. 60 capsule 0  . hydrocortisone (ANUSOL-HC) 2.5 % rectal cream Place 1 application rectally 2 (two) times daily as needed for hemorrhoids or itching.     . hydrocortisone 1 % lotion Apply 1 application topically See admin instructions. Apply small amount to back twice daily for itchy rash as needed    . insulin aspart (NOVOLOG) 100 UNIT/ML injection Inject 4 Units into the skin 3 (three) times daily with meals. 10 mL 0  . insulin aspart protamine - aspart (NOVOLOG MIX 70/30 FLEXPEN) (70-30) 100 UNIT/ML FlexPen Inject 0.1 mLs (10 Units total) into the skin 2 (two) times daily. 15 mL 0    . Insulin Syringe 27G X 1/2" 0.5 ML MISC 100 Syringes by Does not apply route 3 (three) times daily. 100 each 1  . ipratropium-albuterol (DUONEB) 0.5-2.5 (3) MG/3ML SOLN Take 3 mLs by nebulization every 6 (six) hours as needed (shortness of breath/wheezing). 360 mL 0  . ipratropium-albuterol (DUONEB) 0.5-2.5 (3) MG/3ML SOLN Take 3 mLs by nebulization every 6 (six) hours as needed. 360 mL 3  . metolazone (ZAROXOLYN) 2.5 MG tablet Take 2.5 mg by mouth every Tuesday, Thursday, Saturday, and Sunday.    Marland Kitchen oxybutynin (DITROPAN) 5 MG tablet Take 1 tablet (5 mg total) by mouth 4 (four) times daily. 120 tablet 0  . oxyCODONE-acetaminophen (PERCOCET) 10-325 MG tablet Take 1 tablet by mouth every 6 (six) hours as needed for pain.     . OXYGEN Inhale 3 L into the lungs continuous.     . pantoprazole (PROTONIX) 40 MG tablet Take 1 tablet (40 mg total) by mouth  2 (two) times daily. 60 tablet 0  . polyethylene glycol (MIRALAX / GLYCOLAX) 17 g packet Take 17 g by mouth daily as needed. 14 each 0  . potassium chloride SA (K-DUR) 20 MEQ tablet Take 3 tablets (60 mEq total) by mouth 2 (two) times daily. With additional 48mq when you take Metolazone. 90 tablet 3  . PRESCRIPTION MEDICATION Cpap    . rosuvastatin (CRESTOR) 20 MG tablet Take 20 mg by mouth at bedtime.    . senna-docusate (SENOKOT-S) 8.6-50 MG tablet Take 1 tablet by mouth at bedtime as needed for mild constipation. 30 tablet 0  . sodium chloride (OCEAN) 0.65 % SOLN nasal spray Place 2 sprays into both nostrils as needed for congestion.     .Marland Kitchenspironolactone (ALDACTONE) 25 MG tablet Take 1 tablet (25 mg total) by mouth daily. 30 tablet 6  . tamsulosin (FLOMAX) 0.4 MG CAPS capsule Take 1 capsule (0.4 mg total) by mouth daily after supper. 30 capsule 3  . torsemide (DEMADEX) 20 MG tablet Take 5 tablets (100 mg total) by mouth 2 (two) times daily. 150 tablet 3   No current facility-administered medications for this encounter.    Allergies:   Patient has  no known allergies.   Social History:  The patient  reports that he quit smoking about 11 months ago. His smoking use included cigarettes. He has a 34.00 pack-year smoking history. He has never used smokeless tobacco. He reports that he does not drink alcohol or use drugs.   Family History:  The patient's family history includes Heart disease in his father and mother; Heart failure in his mother; Hypertension in his mother and sister.   ROS:  Please see the history of present illness.   All other systems are personally reviewed and negative.  Wt Readings from Last 3 Encounters:  10/31/19 111.6 kg (246 lb)  10/12/19 109.3 kg (241 lb)  09/28/19 112 kg (247 lb)   Vitals:   11/01/19 1510  BP: (!) 102/50  Pulse: 91  SpO2: 100%    Exam:   General: NAD Neck: No JVD, no thyromegaly or thyroid nodule.  Lungs: Clear to auscultation bilaterally with normal respiratory effort. CV: Nondisplaced PMI.  Heart regular S1/S2, no S3/S4, 1/6 SEM RUSB.  Legs wrapped, no peripheral edema.  No carotid bruit.  Normal pedal pulses.  Abdomen: Soft, nontender, no hepatosplenomegaly, no distention.  Skin: Intact without lesions or rashes.  Neurologic: Alert and oriented x 3.  Psych: Normal affect. Extremities: No clubbing or cyanosis.  HEENT: Normal.   Recent Labs: 08/20/2019: B Natriuretic Peptide 4,121.7 08/29/2019: Magnesium 2.0 11/01/2019: ALT 18; BUN 64; Creatinine, Ser 2.86; Hemoglobin 11.5; Platelets 453; Potassium 3.7; Sodium 136; TSH 1.652  Personally reviewed   Wt Readings from Last 3 Encounters:  10/31/19 111.6 kg (246 lb)  10/12/19 109.3 kg (241 lb)  09/28/19 112 kg (247 lb)      ASSESSMENT AND PLAN:  1. Chronic systolic => diastolic CHF: With prominent RV failure, likely related to COPD/OSA/OHS. TEE in 3/20 with EF 40-45%, severely dilated RV with moderately decreased systolic function. However, repeat echo in 9/20 showed EF >65% with moderate RV dilation/mildly decreased RV systolic  function, and echo in 10/20 showed EF 60-65% with severe RV dilation/severe RV dysfunction. He has had multiple recent admissions with CHF and COPD exacerbations.  He is chronically NYHA class III.  He is on high doses of diuretics at home, looks near euvolemic today. - Continue torsemide 100 mg bid.  Continue  metolazone 4 times/week. BMET today.  - Continue spironolactone 25 mg daily.  2. Atrial fibrillation: Paroxysmal. He has history of CVA.  He had DCCV in 3/20 and again in 5/20. He failed amiodarone in the past and cannot take Tikosyn due to baseline prolonged QT interval.  However, he was restarted on amiodarone at 10/20 admission and has been in NSR since then.  - Continue apixaban 5 mg bid.   - Poor ablation candidate with LA size - Continue amiodarone but decrease to 100 mg daily (?if amiodarone is making tremor worse).  Check LFTs/TSH today.  He will need a regular eye exam.  3. COPD: No longer smokes. Continue home oxygen. CT chest showed emphysema.  - Dr. Opal Sidles 4. OHS/OSA: Continue 3 L oxygen during the day and CPAP at night.    5. CAD: Nonobstructive disease in 2/20. No chest pain.  - Continue crestor 20 mg daily. - He is on apixaban so no ASA.  6. CKD: Stage 3.  BMET today.  7. Dysuria: Burning with urination and odor.  I will get UA/culture today.   He will followup in 4 wks with NP/PA.   Signed, Loralie Champagne, MD  11/01/2019  [facility] [address] [facility] [place] [address] [phone] (office) [phone] (fax)

## 2019-11-01 NOTE — Patient Instructions (Signed)
DECREASE Amiodarone to 167m ( 1 tab) daily  Labs today We will only contact you if something comes back abnormal or we need to make some changes. Otherwise no news is good news!  Your physician recommends that you schedule a follow-up appointment in: 1 month with Nurse Practitioner.  [facility] you and your health needs are our priority. As part of our continuing mission to provide you with exceptional heart care, we have created designated Provider Care Teams. These Care Teams include your primary Cardiologist (physician) and Advanced Practice Providers (APPs- Physician Assistants and Nurse Practitioners) who all work together to provide you with the care you need, when you need it.   You may see any of the following providers on your designated Care Team at your next follow up: .Marland KitchenDr DGlori Bickers. Dr DLoralie Champagne. ADarrick Grinder NP . BLyda Jester PA . LAudry Riles PharmD   Please be sure to bring in all your medications bottles to every appointment.  '

## 2019-11-01 NOTE — Progress Notes (Signed)
Paramedicine Encounter   Patient ID: Nicholas Caldwell , male,   DOB: 1946/09/10,73 y.o.,  MRN: [id number]   Met patient in clinic today with provider.   sp02-100% p-91 B/p-102/50  Pt here for f/u today. He is c/o decreased urine output and constipation. It appears in his chart he told his hospitalists he wanted to f/u with [facility] for his GI issues. He does not remember telling them that-it looks like he is able to call [facility] and make appointment for more f/u.  He also states he has had darker urine and decreased urine output for 2-3 weeks with strong odor. He did not report this to me last week when I asked him about his urination.  amio down to 116m due to possibly making his tremors worsened.  Will see him tomor to place meds in pill box.   KMarylouise Stacks EWashington Court House12/29/2020

## 2019-11-02 ENCOUNTER — Other Ambulatory Visit (HOSPITAL_COMMUNITY): Payer: Self-pay

## 2019-11-02 LAB — URINE CULTURE: Culture: NO GROWTH

## 2019-11-02 NOTE — Progress Notes (Signed)
Came out today for med rec post clinic visit yesterday and to fill pts pill boxes.  Pt in bathroom when I arrived and stayed in there the whole time.   Pill box until 1/7. Will come back next Thursday.  Out of the spironolactone and the famotidine-so that was not placed in pill box. Wife will pick up and place in box.  Also out of the eliquis -she will call that in.  Also left her the GI doc number that seen him in the hosp to f/u on his GI issues.    Marylouise Stacks, EMT-Paramedic  11/02/19

## 2019-11-03 ENCOUNTER — Telehealth (HOSPITAL_COMMUNITY): Payer: Self-pay

## 2019-11-03 DIAGNOSIS — I5022 Chronic systolic (congestive) heart failure: Secondary | ICD-10-CM

## 2019-11-03 MED ORDER — METOLAZONE 2.5 MG PO TABS: ORAL_TABLET | ORAL | 5 refills | Status: DC

## 2019-11-03 NOTE — Telephone Encounter (Signed)
Pt left VM returning call for results. I called back and he did not answer. I called Katie w/paramedicine and she said she gave medication changes to pt yesterday and changed his med box. Lab appt already scheduled.

## 2019-11-03 NOTE — Telephone Encounter (Signed)
-----  Message from Larey Dresser, MD sent at 11/01/2019  9:50 PM EST ----- Cut back on metolazone to three times a week from four times a week with elevated creatinine.  Repeat BMET 2 wks.

## 2019-11-10 ENCOUNTER — Other Ambulatory Visit (HOSPITAL_COMMUNITY): Payer: Self-pay

## 2019-11-10 NOTE — Progress Notes (Signed)
Paramedicine Encounter    Patient ID: Nicholas Caldwell, male    DOB: 1946/09/09, 74 y.o.   MRN: [id number]   Patient Care Team: [name], MD as PCP - General (Internal Medicine) Jettie Booze, MD as Consulting Physician (Cardiology) Sharmon Revere as Physician Assistant (Cardiology) [name], MD as Attending Physician (Internal Medicine) Thompson Grayer, MD as Consulting Physician (Clinical Cardiac Electrophysiology) [name], MD (Internal Medicine)  Patient Active Problem List   Diagnosis Date Noted  . Acute respiratory failure with hypercapnia ([place])   . Encounter for intubation   . Pressure injury of skin 08/21/2019  . Cardiac arrest ([place]) 08/20/2019  . Palliative care by specialist   . DNR (do not resuscitate) discussion   . Fatigue   . Acute CHF (congestive heart failure) ([place]) 07/07/2019  . Acute on chronic heart failure ([place]) 06/09/2019  . Lobar pneumonia ([place]) 04/14/2019  . Hyperkalemia 04/10/2019  . Cerebral embolism with cerebral infarction 03/21/2019  . Gastritis and gastroduodenitis   . NSVT (nonsustained ventricular tachycardia) ([place]) 03/08/2019  . Tobacco abuse counseling 03/08/2019  . Iron deficiency anemia   . Acute on chronic systolic CHF (congestive heart failure) ([place]) 03/06/2019  . Diabetes mellitus type 2 in obese ([place])   . Primary osteoarthritis of right hip   . Hypomagnesemia   . Steroid-induced hyperglycemia   . Supplemental oxygen dependent   . Leukocytosis   . Hypokalemia   . Acute on chronic anemia   . Diabetic peripheral neuropathy ([place])   . Acute on chronic systolic (congestive) heart failure ([place])   . Acute on chronic respiratory failure ([place]) 01/14/2019  . Hypoxia   . Altered mental status   . Heart failure ([place]) 12/30/2018  . AKI (acute kidney injury) ([place])   . Acute respiratory failure ([place]) 11/22/2018  . Acute on chronic respiratory failure with hypoxia and hypercapnia ([place]) 11/13/2018  . Metabolic  encephalopathy 99/37/1696  . Acute respiratory failure with hypoxia and hypercapnia ([place]) 07/26/2018  . Acute exacerbation of CHF (congestive heart failure) ([place]) 07/26/2018  . CHF exacerbation ([place]) 05/01/2018  . CHF (congestive heart failure) ([place]) 04/30/2018  . Chronic respiratory failure with hypoxia ([place]) 12/28/2017  . Acute encephalopathy   . Atrial fibrillation with RVR ([place])   . SVT (supraventricular tachycardia) ([place])   . COPD GOLD 0   . Medically noncompliant   . Panlobular emphysema ([place])   . OSA (obstructive sleep apnea)   . Pulmonary hypertension ([place])   . Disorientation   . Pressure injury of skin 09/07/2016  . Acute on chronic combined systolic and diastolic CHF (congestive heart failure) ([place]) 09/05/2016  . Skin lesion-left heal 09/05/2016  . Chronic systolic CHF (congestive heart failure) ([place]) 07/16/2016  . COPD (chronic obstructive pulmonary disease) ([place]) 07/16/2016  . Uncontrolled type 2 diabetes mellitus with complication ([place])   . Diabetic polyneuropathy associated with diabetes mellitus due to underlying condition ([place])   . Normocytic anemia 06/29/2016  . CAD - Non-obstructive by LHC1/16 01/21/2016  . Prolonged QT interval 10/09/2015  . Nonischemic cardiomyopathy ([place]) 10/09/2015  . PAF (paroxysmal atrial fibrillation) ([place])   . Chronic pain 02/19/2015  . Morbid obesity ([place]) 02/13/2015  . Dyspnea   . Elevated troponin I level 11/01/2014  . COPD exacerbation ([place]) 11/01/2014  . Essential hypertension 10/31/2014  . Type 2 diabetes mellitus with neuropathy 10/31/2014  . Hyperlipidemia  10/31/2014  . Cigarette smoker 10/31/2014    Current Outpatient Medications:  .  albuterol (VENTOLIN HFA) 108 (90 Base)  MCG/ACT inhaler, Inhale 2 puffs into the lungs every 6 (six) hours as needed for wheezing or shortness of breath., Disp: , Rfl:  .  amiodarone (PACERONE) 100 MG tablet, Take 1 tablet (100 mg total) by mouth daily., Disp: 30 tablet, Rfl: 5 .  apixaban  (ELIQUIS) 5 MG TABS tablet, Take 1 tablet (5 mg total) by mouth 2 (two) times daily., Disp: 60 tablet, Rfl: 1 .  budesonide-formoterol (SYMBICORT) 160-4.5 MCG/ACT inhaler, Inhale 2 puffs into the lungs as needed (shortness of breath). , Disp: , Rfl:  .  Ensure Max Protein (ENSURE MAX PROTEIN) LIQD, Take 330 mLs (11 oz total) by mouth daily., Disp:  , Rfl:  .  famotidine (PEPCID) 20 MG tablet, Take 1 tablet (20 mg total) by mouth 2 (two) times daily., Disp: 60 tablet, Rfl: 2 .  FEROSUL 325 (65 Fe) MG tablet, Take 325 mg by mouth 2 (two) times daily., Disp: , Rfl:  .  fluticasone (FLONASE) 50 MCG/ACT nasal spray, Place 2 sprays into both nostrils daily as needed for allergies. , Disp: , Rfl:  .  gabapentin (NEURONTIN) 300 MG capsule, Take 1 capsule (300 mg total) by mouth 2 (two) times daily., Disp: 60 capsule, Rfl: 0 .  hydrocortisone (ANUSOL-HC) 2.5 % rectal cream, Place 1 application rectally 2 (two) times daily as needed for hemorrhoids or itching. , Disp: , Rfl:  .  hydrocortisone 1 % lotion, Apply 1 application topically See admin instructions. Apply small amount to back twice daily for itchy rash as needed, Disp: , Rfl:  .  insulin aspart (NOVOLOG) 100 UNIT/ML injection, Inject 4 Units into the skin 3 (three) times daily with meals., Disp: 10 mL, Rfl: 0 .  insulin aspart protamine - aspart (NOVOLOG MIX 70/30 FLEXPEN) (70-30) 100 UNIT/ML FlexPen, Inject 0.1 mLs (10 Units total) into the skin 2 (two) times daily., Disp: 15 mL, Rfl: 0 .  Insulin Syringe 27G X 1/2" 0.5 ML MISC, 100 Syringes by Does not apply route 3 (three) times daily., Disp: 100 each, Rfl: 1 .  ipratropium-albuterol (DUONEB) 0.5-2.5 (3) MG/3ML SOLN, Take 3 mLs by nebulization every 6 (six) hours as needed (shortness of breath/wheezing)., Disp: 360 mL, Rfl: 0 .  ipratropium-albuterol (DUONEB) 0.5-2.5 (3) MG/3ML SOLN, Take 3 mLs by nebulization every 6 (six) hours as needed., Disp: 360 mL, Rfl: 3 .  metolazone (ZAROXOLYN) 2.5 MG  tablet, Take 1 tablet by mouth 3 times weekly, Disp: 30 tablet, Rfl: 5 .  oxybutynin (DITROPAN) 5 MG tablet, Take 1 tablet (5 mg total) by mouth 4 (four) times daily., Disp: 120 tablet, Rfl: 0 .  oxyCODONE-acetaminophen (PERCOCET) 10-325 MG tablet, Take 1 tablet by mouth every 6 (six) hours as needed for pain. , Disp: , Rfl:  .  OXYGEN, Inhale 3 L into the lungs continuous. , Disp: , Rfl:  .  polyethylene glycol (MIRALAX / GLYCOLAX) 17 g packet, Take 17 g by mouth daily as needed., Disp: 14 each, Rfl: 0 .  potassium chloride SA (K-DUR) 20 MEQ tablet, Take 3 tablets (60 mEq total) by mouth 2 (two) times daily. With additional 50mq when you take Metolazone., Disp: 90 tablet, Rfl: 3 .  PRESCRIPTION MEDICATION, Cpap, Disp: , Rfl:  .  rosuvastatin (CRESTOR) 20 MG tablet, Take 20 mg by mouth at bedtime., Disp: , Rfl:  .  sodium chloride (OCEAN) 0.65 % SOLN nasal spray, Place 2 sprays into both nostrils as needed for congestion. , Disp: , Rfl:  .  spironolactone (ALDACTONE) 25 MG  tablet, Take 1 tablet (25 mg total) by mouth daily., Disp: 30 tablet, Rfl: 6 .  torsemide (DEMADEX) 20 MG tablet, Take 5 tablets (100 mg total) by mouth 2 (two) times daily., Disp: 150 tablet, Rfl: 3 .  pantoprazole (PROTONIX) 40 MG tablet, Take 1 tablet (40 mg total) by mouth 2 (two) times daily. (Patient not taking: Reported on 11/02/2019), Disp: 60 tablet, Rfl: 0 .  senna-docusate (SENOKOT-S) 8.6-50 MG tablet, Take 1 tablet by mouth at bedtime as needed for mild constipation. (Patient not taking: Reported on 11/10/2019), Disp: 30 tablet, Rfl: 0 .  tamsulosin (FLOMAX) 0.4 MG CAPS capsule, Take 1 capsule (0.4 mg total) by mouth daily after supper. (Patient not taking: Reported on 11/02/2019), Disp: 30 capsule, Rfl: 3 No Known Allergies    Social History   Socioeconomic History  . Marital status: Married    Spouse name: Not on file  . Number of children: Not on file  . Years of education: Not on file  . Highest education  level: Not on file  Occupational History  . Not on file  Tobacco Use  . Smoking status: Former Smoker    Packs/day: 1.00    Years: 34.00    Pack years: 34.00    Types: Cigarettes    Quit date: 11/30/2018    Years since quitting: 0.9  . Smokeless tobacco: Never Used  Substance and Sexual Activity  . Alcohol use: No    Alcohol/week: 0.0 standard drinks  . Drug use: No  . Sexual activity: Not on file  Other Topics Concern  . Not on file  Social History Narrative   ** Merged History Encounter **       Social Determinants of Health   Financial Resource Strain:   . Difficulty of Paying Living Expenses: Not on file  Food Insecurity:   . Worried About Charity fundraiser in the Last Year: Not on file  . Ran Out of Food in the Last Year: Not on file  Transportation Needs:   . Lack of Transportation (Medical): Not on file  . Lack of Transportation (Non-Medical): Not on file  Physical Activity:   . Days of Exercise per Week: Not on file  . Minutes of Exercise per Session: Not on file  Stress:   . Feeling of Stress : Not on file  Social Connections:   . Frequency of Communication with Friends and Family: Not on file  . Frequency of Social Gatherings with Friends and Family: Not on file  . Attends Religious Services: Not on file  . Active Member of Clubs or Organizations: Not on file  . Attends Archivist Meetings: Not on file  . Marital Status: Not on file  Intimate Partner Violence:   . Fear of Current or Ex-Partner: Not on file  . Emotionally Abused: Not on file  . Physically Abused: Not on file  . Sexually Abused: Not on file    Physical Exam      Future Appointments  Date Time Provider [place]  11/17/2019  1:45 PM [facility] None  12/05/2019  3:00 PM [facility] PA/NP [facility] None  12/19/2019  2:20 PM Cirigliano, Vito V, DO [facility]    BP (!) 100/52   Pulse 72   Temp 97.7 F (36.5 C)   Resp 20   SpO2 99%   Weight  yesterday-? Last visit weight-246 a few wks ago   Pt sitting up in bedroom listening to music, he seems to be  in a very good mood today and good spirits.  Filled his pill box up until 2/1.  He states he is able to walk around the house more without issues. He has the leg wraps on and nurse is suppose to be out tomor to change them out. Lungs clear today. No edema noted.   Marylouise Stacks, [place] [facility] Paramedic  11/10/19

## 2019-11-17 ENCOUNTER — Other Ambulatory Visit: Payer: Self-pay

## 2019-11-17 ENCOUNTER — Ambulatory Visit (HOSPITAL_COMMUNITY)
Admission: RE | Admit: 2019-11-17 | Discharge: 2019-11-17 | Disposition: A | Discharge status: Home / Self Care | Payer: Medicare HMO | Source: Ambulatory Visit | Attending: Internal Medicine | Admitting: Internal Medicine

## 2019-11-17 DIAGNOSIS — I5022 Chronic systolic (congestive) heart failure: Secondary | ICD-10-CM | POA: Insufficient documentation

## 2019-11-17 LAB — BASIC METABOLIC PANEL
Anion gap: 15 (ref 5–15)
BUN: 82 mg/dL — ABNORMAL HIGH (ref 8–23)
CO2: 27 mmol/L (ref 22–32)
Calcium: 8.2 mg/dL — ABNORMAL LOW (ref 8.9–10.3)
Chloride: 93 mmol/L — ABNORMAL LOW (ref 98–111)
Creatinine, Ser: 3.95 mg/dL — ABNORMAL HIGH (ref 0.61–1.24)
GFR calc Af Amer: 17 mL/min — ABNORMAL LOW (ref >60)
GFR calc non Af Amer: 14 mL/min — ABNORMAL LOW (ref >60)
Glucose, Bld: 200 mg/dL — ABNORMAL HIGH (ref 70–99)
Potassium: 4.3 mmol/L (ref 3.5–5.1)
Sodium: 135 mmol/L (ref 135–145)

## 2019-11-18 ENCOUNTER — Telehealth (HOSPITAL_COMMUNITY): Payer: Self-pay

## 2019-11-18 MED ORDER — TORSEMIDE 20 MG PO TABS: 80.0000 mg | ORAL_TABLET | Freq: Two times a day (BID) | ORAL | 3 refills | Status: DC

## 2019-11-18 MED ORDER — METOLAZONE 2.5 MG PO TABS: ORAL_TABLET | ORAL | 5 refills | Status: DC

## 2019-11-18 NOTE — Telephone Encounter (Signed)
-----  Message from Larey Dresser, MD sent at 11/18/2019  4:27 PM EST ----- Hold torsemide for 1 day then decrease to 80 mg bid.  Hold metolazone for 1 week then decrease dose to 2.5 mg twice a week after that (had been taking four times a week).

## 2019-11-21 ENCOUNTER — Telehealth (HOSPITAL_COMMUNITY): Payer: Self-pay

## 2019-11-21 ENCOUNTER — Emergency Department (HOSPITAL_COMMUNITY): Payer: Medicare HMO

## 2019-11-21 ENCOUNTER — Emergency Department (HOSPITAL_COMMUNITY)
Admission: EM | Admit: 2019-11-21 | Discharge: 2019-11-21 | Discharge status: Against Medical Advice | Payer: Medicare HMO | Attending: Emergency Medicine | Admitting: Emergency Medicine

## 2019-11-21 ENCOUNTER — Other Ambulatory Visit: Payer: Self-pay

## 2019-11-21 ENCOUNTER — Encounter (HOSPITAL_COMMUNITY): Payer: Self-pay | Admitting: Emergency Medicine

## 2019-11-21 DIAGNOSIS — R0602 Shortness of breath: Secondary | ICD-10-CM | POA: Diagnosis not present

## 2019-11-21 DIAGNOSIS — Z5321 Procedure and treatment not carried out due to patient leaving prior to being seen by health care provider: Secondary | ICD-10-CM | POA: Diagnosis not present

## 2019-11-21 DIAGNOSIS — I5032 Chronic diastolic (congestive) heart failure: Secondary | ICD-10-CM

## 2019-11-21 LAB — BASIC METABOLIC PANEL
Anion gap: 13 (ref 5–15)
BUN: 97 mg/dL — ABNORMAL HIGH (ref 8–23)
CO2: 26 mmol/L (ref 22–32)
Calcium: 8.4 mg/dL — ABNORMAL LOW (ref 8.9–10.3)
Chloride: 95 mmol/L — ABNORMAL LOW (ref 98–111)
Creatinine, Ser: 4.01 mg/dL — ABNORMAL HIGH (ref 0.61–1.24)
GFR calc Af Amer: 16 mL/min — ABNORMAL LOW (ref >60)
GFR calc non Af Amer: 14 mL/min — ABNORMAL LOW (ref >60)
Glucose, Bld: 176 mg/dL — ABNORMAL HIGH (ref 70–99)
Potassium: 4.6 mmol/L (ref 3.5–5.1)
Sodium: 134 mmol/L — ABNORMAL LOW (ref 135–145)

## 2019-11-21 LAB — PROTIME-INR
INR: 1.3 — ABNORMAL HIGH (ref 0.8–1.2)
Prothrombin Time: 15.7 seconds — ABNORMAL HIGH (ref 11.4–15.2)

## 2019-11-21 LAB — CBC
HCT: 30.1 % — ABNORMAL LOW (ref 39.0–52.0)
Hemoglobin: 9.2 g/dL — ABNORMAL LOW (ref 13.0–17.0)
MCH: 26.6 pg (ref 26.0–34.0)
MCHC: 30.6 g/dL (ref 30.0–36.0)
MCV: 87 fL (ref 80.0–100.0)
Platelets: 336 10*3/uL (ref 150–400)
RBC: 3.46 MIL/uL — ABNORMAL LOW (ref 4.22–5.81)
RDW: 18.8 % — ABNORMAL HIGH (ref 11.5–15.5)
WBC: 9.4 10*3/uL (ref 4.0–10.5)
nRBC: 0 % (ref 0.0–0.2)

## 2019-11-21 LAB — BRAIN NATRIURETIC PEPTIDE: B Natriuretic Peptide: 1675.9 pg/mL — ABNORMAL HIGH (ref 0.0–100.0)

## 2019-11-21 MED ORDER — SODIUM CHLORIDE 0.9% FLUSH: 3.0000 mL | Freq: Once | INTRAVENOUS | Status: DC

## 2019-11-21 NOTE — ED Notes (Signed)
Pt not in lobby and this nurse attempted to call him to request that he return to ED due to abnormal test results. No answer when calling.

## 2019-11-21 NOTE — Telephone Encounter (Signed)
Pt requested renewal for leg wraps for [facility]. Orders placed.

## 2019-11-21 NOTE — ED Triage Notes (Signed)
Pt to ED via [facility] with c/o shortness of breath x's 1 month.  Pt has hx of CHF and is on continuous 02 at home at 4 LPM

## 2019-11-21 NOTE — ED Notes (Signed)
Pt requested that I removed IV that EMS had placed. Stated that he wanted to go home and take his medications. Pt encouraged to stay to wait for test results.

## 2019-11-22 DIAGNOSIS — I2510 Atherosclerotic heart disease of native coronary artery without angina pectoris: Secondary | ICD-10-CM | POA: Diagnosis not present

## 2019-11-23 ENCOUNTER — Telehealth (HOSPITAL_COMMUNITY): Payer: Self-pay

## 2019-11-23 DIAGNOSIS — I130 Hypertensive heart and chronic kidney disease with heart failure and stage 1 through stage 4 chronic kidney disease, or unspecified chronic kidney disease: Secondary | ICD-10-CM | POA: Diagnosis not present

## 2019-11-23 DIAGNOSIS — N183 Chronic kidney disease, stage 3 unspecified: Secondary | ICD-10-CM | POA: Diagnosis not present

## 2019-11-23 DIAGNOSIS — E1122 Type 2 diabetes mellitus with diabetic chronic kidney disease: Secondary | ICD-10-CM | POA: Diagnosis not present

## 2019-11-23 DIAGNOSIS — I480 Paroxysmal atrial fibrillation: Secondary | ICD-10-CM | POA: Diagnosis not present

## 2019-11-23 DIAGNOSIS — I428 Other cardiomyopathies: Secondary | ICD-10-CM | POA: Diagnosis not present

## 2019-11-23 DIAGNOSIS — I2510 Atherosclerotic heart disease of native coronary artery without angina pectoris: Secondary | ICD-10-CM | POA: Diagnosis not present

## 2019-11-23 DIAGNOSIS — E785 Hyperlipidemia, unspecified: Secondary | ICD-10-CM | POA: Diagnosis not present

## 2019-11-23 DIAGNOSIS — J449 Chronic obstructive pulmonary disease, unspecified: Secondary | ICD-10-CM | POA: Diagnosis not present

## 2019-11-23 DIAGNOSIS — I5022 Chronic systolic (congestive) heart failure: Secondary | ICD-10-CM | POA: Diagnosis not present

## 2019-11-23 NOTE — Telephone Encounter (Signed)
Spoke with RN Levada Dy from [facility] regarding patient leg wraps. She states patient has been wearing unna boots since December.  She removed them and found that his legs had no break down however left leg was swollen.  He did not endorse any complaints to her.  She recommended not replacing unna boots.  She recommended kerlix wraps with cobane.  D/w Dr [name], verbal order given to continue leg wraps with kerlix and cobane.  She will measure his legs next week to transition him to compression hose.  She will call next week with measurement so we can write prescription.

## 2019-11-28 DIAGNOSIS — J9621 Acute and chronic respiratory failure with hypoxia: Secondary | ICD-10-CM | POA: Diagnosis not present

## 2019-11-28 DIAGNOSIS — J441 Chronic obstructive pulmonary disease with (acute) exacerbation: Secondary | ICD-10-CM | POA: Diagnosis not present

## 2019-11-28 DIAGNOSIS — J449 Chronic obstructive pulmonary disease, unspecified: Secondary | ICD-10-CM | POA: Diagnosis not present

## 2019-11-28 DIAGNOSIS — R269 Unspecified abnormalities of gait and mobility: Secondary | ICD-10-CM | POA: Diagnosis not present

## 2019-12-01 DIAGNOSIS — I130 Hypertensive heart and chronic kidney disease with heart failure and stage 1 through stage 4 chronic kidney disease, or unspecified chronic kidney disease: Secondary | ICD-10-CM | POA: Diagnosis not present

## 2019-12-01 DIAGNOSIS — I2510 Atherosclerotic heart disease of native coronary artery without angina pectoris: Secondary | ICD-10-CM | POA: Diagnosis not present

## 2019-12-01 DIAGNOSIS — E1122 Type 2 diabetes mellitus with diabetic chronic kidney disease: Secondary | ICD-10-CM | POA: Diagnosis not present

## 2019-12-01 DIAGNOSIS — I480 Paroxysmal atrial fibrillation: Secondary | ICD-10-CM | POA: Diagnosis not present

## 2019-12-01 DIAGNOSIS — J449 Chronic obstructive pulmonary disease, unspecified: Secondary | ICD-10-CM | POA: Diagnosis not present

## 2019-12-01 DIAGNOSIS — E785 Hyperlipidemia, unspecified: Secondary | ICD-10-CM | POA: Diagnosis not present

## 2019-12-01 DIAGNOSIS — N183 Chronic kidney disease, stage 3 unspecified: Secondary | ICD-10-CM | POA: Diagnosis not present

## 2019-12-01 DIAGNOSIS — I428 Other cardiomyopathies: Secondary | ICD-10-CM | POA: Diagnosis not present

## 2019-12-01 DIAGNOSIS — I5022 Chronic systolic (congestive) heart failure: Secondary | ICD-10-CM | POA: Diagnosis not present

## 2019-12-05 ENCOUNTER — Ambulatory Visit (HOSPITAL_COMMUNITY)
Admission: RE | Admit: 2019-12-05 | Discharge: 2019-12-05 | Disposition: A | Discharge status: Home / Self Care | Payer: Medicare HMO | Source: Ambulatory Visit | Attending: Cardiology | Admitting: Cardiology

## 2019-12-05 ENCOUNTER — Other Ambulatory Visit: Payer: Self-pay

## 2019-12-05 ENCOUNTER — Other Ambulatory Visit (HOSPITAL_COMMUNITY): Payer: Self-pay

## 2019-12-05 ENCOUNTER — Encounter (HOSPITAL_COMMUNITY): Payer: Self-pay

## 2019-12-05 VITALS — BP 126/84 | HR 82 | Wt 246.9 lb

## 2019-12-05 DIAGNOSIS — G8929 Other chronic pain: Secondary | ICD-10-CM | POA: Diagnosis not present

## 2019-12-05 DIAGNOSIS — Z87891 Personal history of nicotine dependence: Secondary | ICD-10-CM | POA: Insufficient documentation

## 2019-12-05 DIAGNOSIS — J449 Chronic obstructive pulmonary disease, unspecified: Secondary | ICD-10-CM | POA: Diagnosis not present

## 2019-12-05 DIAGNOSIS — I5022 Chronic systolic (congestive) heart failure: Secondary | ICD-10-CM | POA: Diagnosis not present

## 2019-12-05 DIAGNOSIS — Z8249 Family history of ischemic heart disease and other diseases of the circulatory system: Secondary | ICD-10-CM | POA: Insufficient documentation

## 2019-12-05 DIAGNOSIS — I480 Paroxysmal atrial fibrillation: Secondary | ICD-10-CM | POA: Insufficient documentation

## 2019-12-05 DIAGNOSIS — I440 Atrioventricular block, first degree: Secondary | ICD-10-CM | POA: Diagnosis not present

## 2019-12-05 DIAGNOSIS — G4733 Obstructive sleep apnea (adult) (pediatric): Secondary | ICD-10-CM | POA: Insufficient documentation

## 2019-12-05 DIAGNOSIS — M25559 Pain in unspecified hip: Secondary | ICD-10-CM | POA: Diagnosis not present

## 2019-12-05 DIAGNOSIS — I5042 Chronic combined systolic (congestive) and diastolic (congestive) heart failure: Secondary | ICD-10-CM | POA: Insufficient documentation

## 2019-12-05 DIAGNOSIS — E785 Hyperlipidemia, unspecified: Secondary | ICD-10-CM | POA: Insufficient documentation

## 2019-12-05 DIAGNOSIS — Z8674 Personal history of sudden cardiac arrest: Secondary | ICD-10-CM | POA: Insufficient documentation

## 2019-12-05 DIAGNOSIS — Z9981 Dependence on supplemental oxygen: Secondary | ICD-10-CM | POA: Insufficient documentation

## 2019-12-05 DIAGNOSIS — N183 Chronic kidney disease, stage 3 unspecified: Secondary | ICD-10-CM | POA: Insufficient documentation

## 2019-12-05 DIAGNOSIS — I2510 Atherosclerotic heart disease of native coronary artery without angina pectoris: Secondary | ICD-10-CM | POA: Insufficient documentation

## 2019-12-05 DIAGNOSIS — I428 Other cardiomyopathies: Secondary | ICD-10-CM | POA: Insufficient documentation

## 2019-12-05 DIAGNOSIS — Z7951 Long term (current) use of inhaled steroids: Secondary | ICD-10-CM | POA: Insufficient documentation

## 2019-12-05 DIAGNOSIS — Z79899 Other long term (current) drug therapy: Secondary | ICD-10-CM | POA: Insufficient documentation

## 2019-12-05 DIAGNOSIS — D649 Anemia, unspecified: Secondary | ICD-10-CM | POA: Insufficient documentation

## 2019-12-05 DIAGNOSIS — I130 Hypertensive heart and chronic kidney disease with heart failure and stage 1 through stage 4 chronic kidney disease, or unspecified chronic kidney disease: Secondary | ICD-10-CM | POA: Insufficient documentation

## 2019-12-05 DIAGNOSIS — Z794 Long term (current) use of insulin: Secondary | ICD-10-CM | POA: Insufficient documentation

## 2019-12-05 DIAGNOSIS — Z7901 Long term (current) use of anticoagulants: Secondary | ICD-10-CM | POA: Insufficient documentation

## 2019-12-05 DIAGNOSIS — E1122 Type 2 diabetes mellitus with diabetic chronic kidney disease: Secondary | ICD-10-CM | POA: Diagnosis not present

## 2019-12-05 DIAGNOSIS — Z8673 Personal history of transient ischemic attack (TIA), and cerebral infarction without residual deficits: Secondary | ICD-10-CM | POA: Insufficient documentation

## 2019-12-05 LAB — CBC
HCT: 29.6 % — ABNORMAL LOW (ref 39.0–52.0)
Hemoglobin: 8.8 g/dL — ABNORMAL LOW (ref 13.0–17.0)
MCH: 26.5 pg (ref 26.0–34.0)
MCHC: 29.7 g/dL — ABNORMAL LOW (ref 30.0–36.0)
MCV: 89.2 fL (ref 80.0–100.0)
Platelets: 512 10*3/uL — ABNORMAL HIGH (ref 150–400)
RBC: 3.32 MIL/uL — ABNORMAL LOW (ref 4.22–5.81)
RDW: 19.4 % — ABNORMAL HIGH (ref 11.5–15.5)
WBC: 6.7 10*3/uL (ref 4.0–10.5)
nRBC: 0 % (ref 0.0–0.2)

## 2019-12-05 LAB — BASIC METABOLIC PANEL
Anion gap: 14 (ref 5–15)
BUN: 59 mg/dL — ABNORMAL HIGH (ref 8–23)
CO2: 27 mmol/L (ref 22–32)
Calcium: 8.1 mg/dL — ABNORMAL LOW (ref 8.9–10.3)
Chloride: 97 mmol/L — ABNORMAL LOW (ref 98–111)
Creatinine, Ser: 3.48 mg/dL — ABNORMAL HIGH (ref 0.61–1.24)
GFR calc Af Amer: 19 mL/min — ABNORMAL LOW (ref >60)
GFR calc non Af Amer: 17 mL/min — ABNORMAL LOW (ref >60)
Glucose, Bld: 124 mg/dL — ABNORMAL HIGH (ref 70–99)
Potassium: 4.1 mmol/L (ref 3.5–5.1)
Sodium: 138 mmol/L (ref 135–145)

## 2019-12-05 LAB — BRAIN NATRIURETIC PEPTIDE: B Natriuretic Peptide: 1175.8 pg/mL — ABNORMAL HIGH (ref 0.0–100.0)

## 2019-12-05 NOTE — Progress Notes (Signed)
ReDS Vest / Clip - 12/05/19 1500      ReDS Vest / Clip   Station Marker  D    Ruler Value  38    ReDS Value Range  Moderate volume overload    ReDS Actual Value  36    Anatomical Comments  sitting

## 2019-12-05 NOTE — Patient Instructions (Addendum)
Lab work done today. We will notify you of any abnormal lab work. No news is good news!  Please follow up with the [facility] in 6 weeks with [name].   At the [facility], you and your health needs are our priority. As part of our continuing mission to provide you with exceptional heart care, we have created designated Provider Care Teams. These Care Teams include your primary Cardiologist (physician) and Advanced Practice Providers (APPs- Physician Assistants and Nurse Practitioners) who all work together to provide you with the care you need, when you need it.   You may see any of the following providers on your designated Care Team at your next follow up: Marland Kitchen Dr Glori Bickers . Dr Loralie Champagne . Darrick Grinder, NP . Lyda Jester, PA . Audry Riles, PharmD   Please be sure to bring in all your medications bottles to every appointment.

## 2019-12-05 NOTE — Progress Notes (Signed)
Heart Failure  Note Date:  12/05/2019   ID:  Nicholas Caldwell, DOB Mar 14, 1946, MRN [id number]   Provider location: [facility] Type of Visit: Established patient  PCP:  [name], MD  HF Cardiology: Dr [name]   Chief Complaint: F/u for Chronic Diastolic Heart Failure    History of Present Illness: Nicholas Caldwell is a 74 y.o. male with a history of chronic systolic CHFdue to NICM (EF 40-45%)with moderate RV failure, COPD on 3 L O2, DM, HTN, OHS/OSA on CPAP, PAF s/p DCCV, hyperlipidemia, CAD, and noncompliance.He has multiple admissions due to HF and respiratory failure.   Most recently admitted from 03/07/19-03/31/19 with massive volume overload with severe RV failure, AFL and cardiorenal syndrome. Attempts at diuresis limited by worsening creatinine which peaked at 2.6. Treated with milrinone but still unable to get CVP below 15. Hospital course c/b severe non-compliance with high fluid intake, PAFL, penile wound, acute versus subacute CVA and Citrobacter UTI. Underwent DC-CV of AFL into NSR.  D/c'd home on 5/28 with weight of 270 and creatinine 1.99. Readmitted6/6/2020with recurrent hypoxic/hypercarbic respiratory failure. (ABG 7.27/66/55/86%). Found to have severe hyperkalemia (K>7.5) and large left pleural effusion. Weightwasup about 10 pounds since d/c.Diuresed with IV lasix and later transitioned to torsemide 80 mg twice a day.  Discharge weight on 04/14/2019 was 251 pounds.   Admitted 04/04/93 with A/C systolic/diastolic heart failure. Bisoprolol was stopped on admit and not continued on discharge. Diuresed with with IV lasix and transitioned back to torsemide 80 mg twice a day + metolazone 4 times a week.   Admitted 05/08/53 with A/C systolic/diastolic heart failure + AECOPD. Diuresed with IV lasix and discharged on torsemide 80 mg twice a day + metolazone 4x a week. Treated with steroids and antibiotics. He was placed on low dose bisoprolol.  Palliative Care  consulted. MOST form was introduced. He wished to continue full code.   He was admitted in 10/20 with RLL PNA (treated with ceftriaxone), CHF exacerbation, and right pleural effusion.  He had right-sided thoracentesis.  He was admitted again in 10/20 with PEA arrest, thought to be due to overuse of narcotic pain meds and underuse of Bipap.  He was noted to be in atrial fibrillation and amiodarone was started, he went back into NSR. He was diuresed for CHF.  He had post hospital f/u on 11/12 and was doing fairly well and stable. Has been followed regularly by paramedicine.   Had return f/u w/ [name] on 11/01/19 .  He was stable symptomatically.  Not walking much due to hip pain but felt to be stable from volume standpoint. Had complained of dysuria, however UA and urine culture were negative for UTI. HF meds were continued w/o change. He was instructed to return back in 4 weeks.   Since last clinic appt, he presented to the ED on 11/21/19 via [facility] w/ CC of SOB. He had labs drawn and BNP was elevated at 1,675 (down from prior level of 4,121). SCr had increased to 4.01 (up from 3.95). K was 4.6. CBC showed chronic anemia, w/ Hgb down from 11.5>>9.2 (baseline hgb 8-11). INR was 1.3. CXR showed vascular congestion and pulmonary edema, probable trace pleural effusion. Also noted was patchy, more confluent airspace disease at the bases may reflect atelectasis or superimposed pneumonia, however WBC ct was WNL. EKG personally reviewed and showed NSR w/ 1st degree AV block. Unfortunately, he left the ED prior to being seen by provider as he was tired  of waiting.   Today in clinic, he is doing fairly well. Still limited functionally due to chronic hip pain. Here in wheelchair. Does not ambulate much at home. Still SOB at times and on supplemental O2. He thinks he needs antibiotics and prednisone. Thinks he has bronchitis. Has dry cough. No wheezing on exam. No fever or chills. His wt is overall down from 259 to 246  lb. ReDs clip measurement however is borderline high at 36%. Still making urine w/ home diuretics. Followed by paramedicine. Reports full med compliance.    ECG (personally reviewed): not performed, RRR on exam    Labs (10/20): K 3.3, creatinine 1.7 Labs (11/20): K 3.9, creatinine 2.21, hgb 10.1 Labs (12/20) K 3.7, creatinine 2.86 Labs (1/21) K 4.3, creatinine 3.95 Labs 1/21 K 4.6, creatinine 4.01   PMH: 1. Chronic systolic CHF: Nonischemic cardiomyopathy, diagnosed in 2016.  - LHC (1/16) with mild nonobstructive CAD.  - Echo (12/17): EF 35-40%, mildly dilated RV with mild to moderately decreased RV systolic function, PASP 57 mmHg.  - Echo (3/20): EF 45-50%, mild LVH, D-shaped septum with severe RV dilation and RV function.  - TEE (3/20): EF 40-45%, moderate LVH, severe RV dilation with moderate dysfunction.  - RHC (2/20): mean RA 10, PA 52/16 mean 30, mean PCWP 10, CI 3.85, PVR 2.2 WU - Echo (9/20): EF >65%, moderate RV dilation with mildly decreased systolic function.  - Echo (10/20): EF 60-65%, severe RV dilation with severely decreased RV systolic function.  2. Atrial fibrillation: Paroxysmal. Severe LAE, has seen Dr Rayann Heman and thought to have low chance of success with atrial fibrillation ablation. Tikosyn not used due to long QT. He was on amiodarone in the past but this was stopped due to long QT.  - DCCV 3/20, 5/20.  3. COPD: On 3 L home oxygen.  4. Type II diabetes 5. Hyperlipidemia 6. HTN 7. OHS/OSA: Home oxygen + CPAP.  8. CAD: LHC (2/20) with 50-60% distal LAD, 50% proximal LCx, 50% proximal RCA (nondominant RCA).  9. CVA: 5/20.  10. CKD: Stage 3.  11. ABIs (9/20): normal.  12. PEA arrest: 10/20, due to overuse of narcotic pain meds and underuse of Bipap.  13. Tremor  Current Outpatient Medications  Medication Sig Dispense Refill  . albuterol (VENTOLIN HFA) 108 (90 Base) MCG/ACT inhaler Inhale 2 puffs into the lungs every 6 (six) hours as needed for wheezing or  shortness of breath.    Marland Kitchen amiodarone (PACERONE) 100 MG tablet Take 1 tablet (100 mg total) by mouth daily. 30 tablet 5  . apixaban (ELIQUIS) 5 MG TABS tablet Take 1 tablet (5 mg total) by mouth 2 (two) times daily. 60 tablet 1  . budesonide-formoterol (SYMBICORT) 160-4.5 MCG/ACT inhaler Inhale 2 puffs into the lungs as needed (shortness of breath).     . Ensure Max Protein (ENSURE MAX PROTEIN) LIQD Take 330 mLs (11 oz total) by mouth daily.    . famotidine (PEPCID) 20 MG tablet Take 1 tablet (20 mg total) by mouth 2 (two) times daily. 60 tablet 2  . FEROSUL 325 (65 Fe) MG tablet Take 325 mg by mouth 2 (two) times daily.    . fluticasone (FLONASE) 50 MCG/ACT nasal spray Place 2 sprays into both nostrils daily as needed for allergies.     Marland Kitchen gabapentin (NEURONTIN) 300 MG capsule Take 1 capsule (300 mg total) by mouth 2 (two) times daily. 60 capsule 0  . hydrocortisone (ANUSOL-HC) 2.5 % rectal cream Place 1 application rectally 2 (  two) times daily as needed for hemorrhoids or itching.     . hydrocortisone 1 % lotion Apply 1 application topically See admin instructions. Apply small amount to back twice daily for itchy rash as needed    . insulin aspart (NOVOLOG) 100 UNIT/ML injection Inject 4 Units into the skin 3 (three) times daily with meals. 10 mL 0  . insulin aspart protamine - aspart (NOVOLOG MIX 70/30 FLEXPEN) (70-30) 100 UNIT/ML FlexPen Inject 0.1 mLs (10 Units total) into the skin 2 (two) times daily. 15 mL 0  . Insulin Syringe 27G X 1/2" 0.5 ML MISC 100 Syringes by Does not apply route 3 (three) times daily. 100 each 1  . ipratropium-albuterol (DUONEB) 0.5-2.5 (3) MG/3ML SOLN Take 3 mLs by nebulization every 6 (six) hours as needed (shortness of breath/wheezing). 360 mL 0  . ipratropium-albuterol (DUONEB) 0.5-2.5 (3) MG/3ML SOLN Take 3 mLs by nebulization every 6 (six) hours as needed. 360 mL 3  . metolazone (ZAROXOLYN) 2.5 MG tablet Take 1 tablet by mouth 2 times weekly 30 tablet 5  .  oxybutynin (DITROPAN) 5 MG tablet Take 1 tablet (5 mg total) by mouth 4 (four) times daily. 120 tablet 0  . oxyCODONE-acetaminophen (PERCOCET) 10-325 MG tablet Take 1 tablet by mouth every 6 (six) hours as needed for pain.     . OXYGEN Inhale 3 L into the lungs continuous.     . pantoprazole (PROTONIX) 40 MG tablet Take 1 tablet (40 mg total) by mouth 2 (two) times daily. 60 tablet 0  . polyethylene glycol (MIRALAX / GLYCOLAX) 17 g packet Take 17 g by mouth daily as needed. 14 each 0  . potassium chloride SA (K-DUR) 20 MEQ tablet Take 3 tablets (60 mEq total) by mouth 2 (two) times daily. With additional 17mq when you take Metolazone. 90 tablet 3  . PRESCRIPTION MEDICATION Cpap    . rosuvastatin (CRESTOR) 20 MG tablet Take 20 mg by mouth at bedtime.    . senna-docusate (SENOKOT-S) 8.6-50 MG tablet Take 1 tablet by mouth at bedtime as needed for mild constipation. 30 tablet 0  . sodium chloride (OCEAN) 0.65 % SOLN nasal spray Place 2 sprays into both nostrils as needed for congestion.     .Marland Kitchenspironolactone (ALDACTONE) 25 MG tablet Take 1 tablet (25 mg total) by mouth daily. 30 tablet 6  . torsemide (DEMADEX) 20 MG tablet Take 4 tablets (80 mg total) by mouth 2 (two) times daily. 150 tablet 3   No current facility-administered medications for this encounter.    Allergies:   Patient has no known allergies.   Social History:  The patient  reports that he quit smoking about a year ago. His smoking use included cigarettes. He has a 34.00 pack-year smoking history. He has never used smokeless tobacco. He reports that he does not drink alcohol or use drugs.   Family History:  The patient's family history includes Heart disease in his father and mother; Heart failure in his mother; Hypertension in his mother and sister.   ROS:  Please see the history of present illness.   All other systems are personally reviewed and negative.  Wt Readings from Last 3 Encounters:  12/05/19 112 kg (246 lb 14.4 oz)    11/21/19 117.5 kg (259 lb)  10/31/19 111.6 kg (246 lb)   Vitals:   12/05/19 1523  BP: 126/84  Pulse: 82  SpO2: 100%    Exam:   ReDs clip 36%  PHYSICAL EXAM: General:  Chronically ill  appearing, moderately obese, elderly AAM. No respiratory difficulty HEENT: normal Neck: supple. no JVD. Carotids 2+ bilat; no bruits. No lymphadenopathy or thyromegaly appreciated. Cor: PMI nondisplaced. Regular rate & rhythm. No rubs, gallops or murmurs. Lungs: clear, no wheezing, rhonchi or rales  Abdomen: obese, soft, nontender, nondistended. No hepatosplenomegaly. No bruits or masses. Good bowel sounds. Extremities: no cyanosis, clubbing, rash, trace bilateral LE edema, bilateral unna boots  Neuro: alert & oriented x 3, cranial nerves grossly intact. moves all 4 extremities w/o difficulty. Affect pleasant.   Recent Labs: 08/29/2019: Magnesium 2.0 11/01/2019: ALT 18; TSH 1.652 11/21/2019: B Natriuretic Peptide 1,675.9; BUN 97; Creatinine, Ser 4.01; Hemoglobin 9.2; Platelets 336; Potassium 4.6; Sodium 134  Personally reviewed   Wt Readings from Last 3 Encounters:  12/05/19 112 kg (246 lb 14.4 oz)  11/21/19 117.5 kg (259 lb)  10/31/19 111.6 kg (246 lb)      ASSESSMENT AND PLAN:  1. Chronic systolic => diastolic CHF: With prominent RV failure, likely related to COPD/OSA/OHS. TEE in 3/20 with EF 40-45%, severely dilated RV with moderately decreased systolic function. However, repeat echo in 9/20 showed EF >65% with moderate RV dilation/mildly decreased RV systolic function, and echo in 10/20 showed EF 60-65% with severe RV dilation/severe RV dysfunction. He has had multiple recent admissions with CHF and COPD exacerbations.  He is chronically NYHA class III.  He is on high doses of diuretics at home, looks near euvolemic today. Wt down 13 lb from previous clinic visit, down from 259>>246 lb. ReDs clip measurement however borderline at 36%. - Check BNP today to better assess volume status.  -  Continue torsemide 100 mg bid.  Continue metolazone 4 times/week. If significant increase in SCr may need reduction of diuretics and discontinuation of spironolactone.  2. Atrial fibrillation: Paroxysmal. He has history of CVA.  He had DCCV in 3/20 and again in 5/20. He failed amiodarone in the past and cannot take Tikosyn due to baseline prolonged QT interval.  However, he was restarted on amiodarone at 10/20 admission and has been in NSR since then.  - Continue apixaban 5 mg bid.    - Poor ablation candidate with LA size - Continue amiodarone 100 mg daily (?if amiodarone is making tremor worse). LFTs/TSH checked 12/20 and WNL.  He will need a regular eye exam.  3. COPD: No longer smokes. Continue home oxygen. CT chest showed emphysema.  - Dr. Opal Sidles 4. OHS/OSA: Continue 3 L oxygen during the day and CPAP at night.    5. CAD: Nonobstructive disease in 2/20. No chest pain.  - Continue crestor 20 mg daily. - He is on apixaban so no ASA.  6. CKD: Recent BMP in ED 11/21/19 showed SCr up to 4.01 - repeat BMP today  - refer to nephrology   F/u in 4 weeks.   Signed, Lyda Jester, PA-C  12/05/2019  [facility] [address] [facility] [place] [address] [phone] (office) [phone] (fax)

## 2019-12-06 ENCOUNTER — Telehealth (HOSPITAL_COMMUNITY): Payer: Self-pay

## 2019-12-06 NOTE — Telephone Encounter (Signed)
Called katie [id number] in order to relay med changes. Left voicemail for call back.

## 2019-12-06 NOTE — Telephone Encounter (Signed)
-----  Message from [place], [place] sent at 12/06/2019 12:33 PM EST ----- BNP elevated but c/w baseline in the setting of chronic renal failure. SCr improved from recent ED visit. He was slightly volume up on exam w/ ReDs clip reading of 36%. Recommend increasing metolazone to 3 days for this week only, then return to taking twice weekly next week. Notify paramedicine. Continue torsemide 80 bid.   His WBC ct was normal. Do not suspect PNA. No indication for antibiotics.   Make sure he is still taking his supp Fe tablet. CBC shows chronic anemia. Hgb 8.8.

## 2019-12-07 ENCOUNTER — Other Ambulatory Visit (HOSPITAL_COMMUNITY): Payer: Self-pay

## 2019-12-07 DIAGNOSIS — J449 Chronic obstructive pulmonary disease, unspecified: Secondary | ICD-10-CM | POA: Diagnosis not present

## 2019-12-07 DIAGNOSIS — I480 Paroxysmal atrial fibrillation: Secondary | ICD-10-CM | POA: Diagnosis not present

## 2019-12-07 DIAGNOSIS — I130 Hypertensive heart and chronic kidney disease with heart failure and stage 1 through stage 4 chronic kidney disease, or unspecified chronic kidney disease: Secondary | ICD-10-CM | POA: Diagnosis not present

## 2019-12-07 DIAGNOSIS — N183 Chronic kidney disease, stage 3 unspecified: Secondary | ICD-10-CM | POA: Diagnosis not present

## 2019-12-07 DIAGNOSIS — I5022 Chronic systolic (congestive) heart failure: Secondary | ICD-10-CM | POA: Diagnosis not present

## 2019-12-07 DIAGNOSIS — E785 Hyperlipidemia, unspecified: Secondary | ICD-10-CM | POA: Diagnosis not present

## 2019-12-07 DIAGNOSIS — I428 Other cardiomyopathies: Secondary | ICD-10-CM | POA: Diagnosis not present

## 2019-12-07 DIAGNOSIS — I2510 Atherosclerotic heart disease of native coronary artery without angina pectoris: Secondary | ICD-10-CM | POA: Diagnosis not present

## 2019-12-07 DIAGNOSIS — E1122 Type 2 diabetes mellitus with diabetic chronic kidney disease: Secondary | ICD-10-CM | POA: Diagnosis not present

## 2019-12-07 NOTE — Progress Notes (Signed)
Paramedicine Encounter    Patient ID: Nicholas Caldwell, male    DOB: 1946/09/09, 74 y.o.   MRN: [id number]   Patient Care Team: [name], MD as PCP - General (Internal Medicine) Jettie Booze, MD as Consulting Physician (Cardiology) Sharmon Revere as Physician Assistant (Cardiology) [name], MD as Attending Physician (Internal Medicine) Thompson Grayer, MD as Consulting Physician (Clinical Cardiac Electrophysiology) [name], MD (Internal Medicine)  Patient Active Problem List   Diagnosis Date Noted  . Acute respiratory failure with hypercapnia ([place])   . Encounter for intubation   . Pressure injury of skin 08/21/2019  . Cardiac arrest ([place]) 08/20/2019  . Palliative care by specialist   . DNR (do not resuscitate) discussion   . Fatigue   . Acute CHF (congestive heart failure) ([place]) 07/07/2019  . Acute on chronic heart failure ([place]) 06/09/2019  . Lobar pneumonia ([place]) 04/14/2019  . Hyperkalemia 04/10/2019  . Cerebral embolism with cerebral infarction 03/21/2019  . Gastritis and gastroduodenitis   . NSVT (nonsustained ventricular tachycardia) ([place]) 03/08/2019  . Tobacco abuse counseling 03/08/2019  . Iron deficiency anemia   . Acute on chronic systolic CHF (congestive heart failure) ([place]) 03/06/2019  . Diabetes mellitus type 2 in obese ([place])   . Primary osteoarthritis of right hip   . Hypomagnesemia   . Steroid-induced hyperglycemia   . Supplemental oxygen dependent   . Leukocytosis   . Hypokalemia   . Acute on chronic anemia   . Diabetic peripheral neuropathy ([place])   . Acute on chronic systolic (congestive) heart failure ([place])   . Acute on chronic respiratory failure ([place]) 01/14/2019  . Hypoxia   . Altered mental status   . Heart failure ([place]) 12/30/2018  . AKI (acute kidney injury) ([place])   . Acute respiratory failure ([place]) 11/22/2018  . Acute on chronic respiratory failure with hypoxia and hypercapnia ([place]) 11/13/2018  . Metabolic  encephalopathy 99/37/1696  . Acute respiratory failure with hypoxia and hypercapnia ([place]) 07/26/2018  . Acute exacerbation of CHF (congestive heart failure) ([place]) 07/26/2018  . CHF exacerbation ([place]) 05/01/2018  . CHF (congestive heart failure) ([place]) 04/30/2018  . Chronic respiratory failure with hypoxia ([place]) 12/28/2017  . Acute encephalopathy   . Atrial fibrillation with RVR ([place])   . SVT (supraventricular tachycardia) ([place])   . COPD GOLD 0   . Medically noncompliant   . Panlobular emphysema ([place])   . OSA (obstructive sleep apnea)   . Pulmonary hypertension ([place])   . Disorientation   . Pressure injury of skin 09/07/2016  . Acute on chronic combined systolic and diastolic CHF (congestive heart failure) ([place]) 09/05/2016  . Skin lesion-left heal 09/05/2016  . Chronic systolic CHF (congestive heart failure) ([place]) 07/16/2016  . COPD (chronic obstructive pulmonary disease) ([place]) 07/16/2016  . Uncontrolled type 2 diabetes mellitus with complication ([place])   . Diabetic polyneuropathy associated with diabetes mellitus due to underlying condition ([place])   . Normocytic anemia 06/29/2016  . CAD - Non-obstructive by LHC1/16 01/21/2016  . Prolonged QT interval 10/09/2015  . Nonischemic cardiomyopathy ([place]) 10/09/2015  . PAF (paroxysmal atrial fibrillation) ([place])   . Chronic pain 02/19/2015  . Morbid obesity ([place]) 02/13/2015  . Dyspnea   . Elevated troponin I level 11/01/2014  . COPD exacerbation ([place]) 11/01/2014  . Essential hypertension 10/31/2014  . Type 2 diabetes mellitus with neuropathy 10/31/2014  . Hyperlipidemia  10/31/2014  . Cigarette smoker 10/31/2014    Current Outpatient Medications:  .  albuterol (VENTOLIN HFA) 108 (90 Base)  MCG/ACT inhaler, Inhale 2 puffs into the lungs every 6 (six) hours as needed for wheezing or shortness of breath., Disp: , Rfl:  .  amiodarone (PACERONE) 100 MG tablet, Take 1 tablet (100 mg total) by mouth daily., Disp: 30 tablet, Rfl: 5 .  apixaban  (ELIQUIS) 5 MG TABS tablet, Take 1 tablet (5 mg total) by mouth 2 (two) times daily., Disp: 60 tablet, Rfl: 1 .  budesonide-formoterol (SYMBICORT) 160-4.5 MCG/ACT inhaler, Inhale 2 puffs into the lungs as needed (shortness of breath). , Disp: , Rfl:  .  Ensure Max Protein (ENSURE MAX PROTEIN) LIQD, Take 330 mLs (11 oz total) by mouth daily., Disp:  , Rfl:  .  famotidine (PEPCID) 20 MG tablet, Take 1 tablet (20 mg total) by mouth 2 (two) times daily., Disp: 60 tablet, Rfl: 2 .  FEROSUL 325 (65 Fe) MG tablet, Take 325 mg by mouth 2 (two) times daily., Disp: , Rfl:  .  fluticasone (FLONASE) 50 MCG/ACT nasal spray, Place 2 sprays into both nostrils daily as needed for allergies. , Disp: , Rfl:  .  gabapentin (NEURONTIN) 300 MG capsule, Take 1 capsule (300 mg total) by mouth 2 (two) times daily., Disp: 60 capsule, Rfl: 0 .  hydrocortisone (ANUSOL-HC) 2.5 % rectal cream, Place 1 application rectally 2 (two) times daily as needed for hemorrhoids or itching. , Disp: , Rfl:  .  hydrocortisone 1 % lotion, Apply 1 application topically See admin instructions. Apply small amount to back twice daily for itchy rash as needed, Disp: , Rfl:  .  insulin aspart (NOVOLOG) 100 UNIT/ML injection, Inject 4 Units into the skin 3 (three) times daily with meals., Disp: 10 mL, Rfl: 0 .  insulin aspart protamine - aspart (NOVOLOG MIX 70/30 FLEXPEN) (70-30) 100 UNIT/ML FlexPen, Inject 0.1 mLs (10 Units total) into the skin 2 (two) times daily., Disp: 15 mL, Rfl: 0 .  Insulin Syringe 27G X 1/2" 0.5 ML MISC, 100 Syringes by Does not apply route 3 (three) times daily., Disp: 100 each, Rfl: 1 .  ipratropium-albuterol (DUONEB) 0.5-2.5 (3) MG/3ML SOLN, Take 3 mLs by nebulization every 6 (six) hours as needed (shortness of breath/wheezing)., Disp: 360 mL, Rfl: 0 .  ipratropium-albuterol (DUONEB) 0.5-2.5 (3) MG/3ML SOLN, Take 3 mLs by nebulization every 6 (six) hours as needed., Disp: 360 mL, Rfl: 3 .  metolazone (ZAROXOLYN) 2.5 MG  tablet, Take 1 tablet by mouth 2 times weekly, Disp: 30 tablet, Rfl: 5 .  oxybutynin (DITROPAN) 5 MG tablet, Take 1 tablet (5 mg total) by mouth 4 (four) times daily., Disp: 120 tablet, Rfl: 0 .  oxyCODONE-acetaminophen (PERCOCET) 10-325 MG tablet, Take 1 tablet by mouth every 6 (six) hours as needed for pain. , Disp: , Rfl:  .  OXYGEN, Inhale 3 L into the lungs continuous. , Disp: , Rfl:  .  polyethylene glycol (MIRALAX / GLYCOLAX) 17 g packet, Take 17 g by mouth daily as needed., Disp: 14 each, Rfl: 0 .  potassium chloride SA (K-DUR) 20 MEQ tablet, Take 3 tablets (60 mEq total) by mouth 2 (two) times daily. With additional 59mq when you take Metolazone., Disp: 90 tablet, Rfl: 3 .  PRESCRIPTION MEDICATION, Cpap, Disp: , Rfl:  .  rosuvastatin (CRESTOR) 20 MG tablet, Take 20 mg by mouth at bedtime., Disp: , Rfl:  .  senna-docusate (SENOKOT-S) 8.6-50 MG tablet, Take 1 tablet by mouth at bedtime as needed for mild constipation., Disp: 30 tablet, Rfl: 0 .  sodium chloride (OCEAN) 0.65 %  SOLN nasal spray, Place 2 sprays into both nostrils as needed for congestion. , Disp: , Rfl:  .  spironolactone (ALDACTONE) 25 MG tablet, Take 1 tablet (25 mg total) by mouth daily., Disp: 30 tablet, Rfl: 6 .  torsemide (DEMADEX) 20 MG tablet, Take 4 tablets (80 mg total) by mouth 2 (two) times daily., Disp: 150 tablet, Rfl: 3 .  pantoprazole (PROTONIX) 40 MG tablet, Take 1 tablet (40 mg total) by mouth 2 (two) times daily. (Patient not taking: Reported on 12/07/2019), Disp: 60 tablet, Rfl: 0 No Known Allergies    Social History   Socioeconomic History  . Marital status: Married    Spouse name: Not on file  . Number of children: Not on file  . Years of education: Not on file  . Highest education level: Not on file  Occupational History  . Not on file  Tobacco Use  . Smoking status: Former Smoker    Packs/day: 1.00    Years: 34.00    Pack years: 34.00    Types: Cigarettes    Quit date: 11/30/2018    Years  since quitting: 1.0  . Smokeless tobacco: Never Used  Substance and Sexual Activity  . Alcohol use: No    Alcohol/week: 0.0 standard drinks  . Drug use: No  . Sexual activity: Not on file  Other Topics Concern  . Not on file  Social History Narrative   ** Merged History Encounter **       Social Determinants of Health   Financial Resource Strain:   . Difficulty of Paying Living Expenses: Not on file  Food Insecurity:   . Worried About Charity fundraiser in the Last Year: Not on file  . Ran Out of Food in the Last Year: Not on file  Transportation Needs:   . Lack of Transportation (Medical): Not on file  . Lack of Transportation (Non-Medical): Not on file  Physical Activity:   . Days of Exercise per Week: Not on file  . Minutes of Exercise per Session: Not on file  Stress:   . Feeling of Stress : Not on file  Social Connections:   . Frequency of Communication with Friends and Family: Not on file  . Frequency of Social Gatherings with Friends and Family: Not on file  . Attends Religious Services: Not on file  . Active Member of Clubs or Organizations: Not on file  . Attends Archivist Meetings: Not on file  . Marital Status: Not on file  Intimate Partner Violence:   . Fear of Current or Ex-Partner: Not on file  . Emotionally Abused: Not on file  . Physically Abused: Not on file  . Sexually Abused: Not on file    Physical Exam      Future Appointments  Date Time Provider [place]  12/19/2019  2:20 PM Lavena Bullion, DO [facility]  02/10/2020  2:20 PM Larey Dresser, MD [facility] None    BP 128/60   Pulse 88   Resp 20   Wt 248 lb (112.5 kg)   SpO2 94%   BMI 33.63 kg/m   Weight yesterday-247 Last visit weight-246 @ clinic   Pt asleep when I arrived. He was seen at clinic the other day.  Metolazone-tues/thur/sat this week and next week its in tues/sat.  meds verified and pill box refilled. Wife ordered refills. Filled 3 wks  worth of meds for him.  HHN arrived during my visit as well to rewrap his legs.  Since his metolazone has been decreased I stressed the importance of not using the salt shaker--wife states he grabs salt shaker and uses very frequently--and to monitor how much he drinks during the day. He still having issues with constipation-he states he is using miralax sometime but not regularly-told him he could take it daily or use the tablets that he has whichever is best for him. He denies any bleeding in urine or stool.    Marylouise Stacks, [place] Paramedic  12/07/19

## 2019-12-07 NOTE — Telephone Encounter (Signed)
Spoke with Katie emt-p to relay temp med changes. Katie verified pt is taking iron supplement as prescribed.

## 2019-12-09 ENCOUNTER — Telehealth (HOSPITAL_COMMUNITY): Payer: Self-pay

## 2019-12-09 DIAGNOSIS — N183 Chronic kidney disease, stage 3 unspecified: Secondary | ICD-10-CM | POA: Diagnosis not present

## 2019-12-09 DIAGNOSIS — E785 Hyperlipidemia, unspecified: Secondary | ICD-10-CM | POA: Diagnosis not present

## 2019-12-09 DIAGNOSIS — E1122 Type 2 diabetes mellitus with diabetic chronic kidney disease: Secondary | ICD-10-CM | POA: Diagnosis not present

## 2019-12-09 DIAGNOSIS — I428 Other cardiomyopathies: Secondary | ICD-10-CM | POA: Diagnosis not present

## 2019-12-09 DIAGNOSIS — I130 Hypertensive heart and chronic kidney disease with heart failure and stage 1 through stage 4 chronic kidney disease, or unspecified chronic kidney disease: Secondary | ICD-10-CM | POA: Diagnosis not present

## 2019-12-09 DIAGNOSIS — I480 Paroxysmal atrial fibrillation: Secondary | ICD-10-CM | POA: Diagnosis not present

## 2019-12-09 DIAGNOSIS — I5022 Chronic systolic (congestive) heart failure: Secondary | ICD-10-CM | POA: Diagnosis not present

## 2019-12-09 DIAGNOSIS — I2510 Atherosclerotic heart disease of native coronary artery without angina pectoris: Secondary | ICD-10-CM | POA: Diagnosis not present

## 2019-12-09 DIAGNOSIS — J449 Chronic obstructive pulmonary disease, unspecified: Secondary | ICD-10-CM | POA: Diagnosis not present

## 2019-12-09 NOTE — Telephone Encounter (Signed)
Angel with home health called to give patients support hose measurements:  LT ankle 21.5cm  LT calf 38.5cm  LLL knee to ankle 37.5cm   RT ankle 21.5cm  RT calf 33.5cm. RLL knee to ankle 39.5cm  Cb# [id number]

## 2019-12-12 ENCOUNTER — Telehealth (HOSPITAL_COMMUNITY): Payer: Self-pay

## 2019-12-12 NOTE — Telephone Encounter (Signed)
Orders signed and faxed to [facility].

## 2019-12-16 ENCOUNTER — Telehealth (HOSPITAL_COMMUNITY): Payer: Self-pay | Admitting: Cardiology

## 2019-12-16 DIAGNOSIS — I480 Paroxysmal atrial fibrillation: Secondary | ICD-10-CM | POA: Diagnosis not present

## 2019-12-16 DIAGNOSIS — E1122 Type 2 diabetes mellitus with diabetic chronic kidney disease: Secondary | ICD-10-CM | POA: Diagnosis not present

## 2019-12-16 DIAGNOSIS — I2510 Atherosclerotic heart disease of native coronary artery without angina pectoris: Secondary | ICD-10-CM | POA: Diagnosis not present

## 2019-12-16 DIAGNOSIS — N183 Chronic kidney disease, stage 3 unspecified: Secondary | ICD-10-CM | POA: Diagnosis not present

## 2019-12-16 DIAGNOSIS — I130 Hypertensive heart and chronic kidney disease with heart failure and stage 1 through stage 4 chronic kidney disease, or unspecified chronic kidney disease: Secondary | ICD-10-CM | POA: Diagnosis not present

## 2019-12-16 DIAGNOSIS — I428 Other cardiomyopathies: Secondary | ICD-10-CM | POA: Diagnosis not present

## 2019-12-16 DIAGNOSIS — J449 Chronic obstructive pulmonary disease, unspecified: Secondary | ICD-10-CM | POA: Diagnosis not present

## 2019-12-16 DIAGNOSIS — I5022 Chronic systolic (congestive) heart failure: Secondary | ICD-10-CM | POA: Diagnosis not present

## 2019-12-16 DIAGNOSIS — E785 Hyperlipidemia, unspecified: Secondary | ICD-10-CM | POA: Diagnosis not present

## 2019-12-16 NOTE — Telephone Encounter (Signed)
Angel,RN called to check the status of compression stockings Advised the measurements were documented however unsure if an actual order was sent to a DME provider. Glenard Haring confirmed order was not received by [facility] and requested an order be sent to [facility] [phone] fax Order written for compression stockings-length of need =99 #2 pairs for BLE knee closed toe 20-60mHG (measurement information added). [facility]

## 2019-12-19 ENCOUNTER — Ambulatory Visit: Payer: Medicare HMO | Admitting: Gastroenterology

## 2019-12-19 ENCOUNTER — Telehealth (HOSPITAL_COMMUNITY): Payer: Self-pay | Admitting: Licensed Clinical Social Worker

## 2019-12-19 NOTE — Telephone Encounter (Signed)
CSW received call from pt wife inquiring about COVID-19 vaccine for patient.    CSW informed pt of sign up pages on [facility] and ability to sign up through [employer] 3 days in advance (first available time is tomorrow for Friday vaccines).  Pt expresses understanding and will plan to have her daughter help them sign up for vaccines.  CSW will continue to follow and assist as needed  Jorge Ny, [facility] Desk#: [phone] Cell#: [phone]

## 2019-12-23 ENCOUNTER — Telehealth (HOSPITAL_COMMUNITY): Payer: Self-pay

## 2019-12-23 NOTE — Telephone Encounter (Signed)
HH orders signed and faxed back to [facility]

## 2019-12-25 ENCOUNTER — Ambulatory Visit: Payer: Medicare HMO | Attending: Internal Medicine

## 2019-12-25 DIAGNOSIS — Z23 Encounter for immunization: Secondary | ICD-10-CM

## 2019-12-25 NOTE — Progress Notes (Signed)
   Covid-19 Vaccination Clinic  Name:  Nicholas Caldwell    MRN: [id number] DOB: [birth year]  12/25/2019  Nicholas Caldwell was observed post Covid-19 immunization for 15 minutes without incidence. He was provided with Vaccine Information Sheet and instruction to access the V-Safe system.   Nicholas Caldwell was instructed to call 911 with any severe reactions post vaccine: Marland Kitchen Difficulty breathing  . Swelling of your face and throat  . A fast heartbeat  . A bad rash all over your body  . Dizziness and weakness    Immunizations Administered    Name Date Dose VIS Date Route   Pfizer COVID-19 Vaccine 12/25/2019  3:25 PM 0.3 mL 10/14/2019 Intramuscular   Manufacturer: [place]   Lot: [id number]   [place]: [address]-6

## 2019-12-26 ENCOUNTER — Telehealth (HOSPITAL_COMMUNITY): Payer: Self-pay | Admitting: Vascular Surgery

## 2019-12-26 DIAGNOSIS — Z79891 Long term (current) use of opiate analgesic: Secondary | ICD-10-CM | POA: Diagnosis not present

## 2019-12-26 DIAGNOSIS — I5022 Chronic systolic (congestive) heart failure: Secondary | ICD-10-CM

## 2019-12-26 DIAGNOSIS — M25551 Pain in right hip: Secondary | ICD-10-CM | POA: Diagnosis not present

## 2019-12-26 DIAGNOSIS — G894 Chronic pain syndrome: Secondary | ICD-10-CM | POA: Diagnosis not present

## 2019-12-26 DIAGNOSIS — M169 Osteoarthritis of hip, unspecified: Secondary | ICD-10-CM | POA: Diagnosis not present

## 2019-12-26 DIAGNOSIS — M47816 Spondylosis without myelopathy or radiculopathy, lumbar region: Secondary | ICD-10-CM | POA: Diagnosis not present

## 2019-12-26 DIAGNOSIS — M5136 Other intervertebral disc degeneration, lumbar region: Secondary | ICD-10-CM | POA: Diagnosis not present

## 2019-12-26 NOTE — Telephone Encounter (Signed)
Wife called wanting [facility] to put in referral for nurse to come to the house for 6 more visits

## 2019-12-27 NOTE — Telephone Encounter (Signed)
Per Dr [name], approved to extend home health services for patient. Referral placed and community message sent to [place].

## 2019-12-28 ENCOUNTER — Telehealth (HOSPITAL_COMMUNITY): Payer: Self-pay

## 2019-12-28 IMAGING — DX DG CHEST 1V PORT
1 series · 1 of 1 positions shown · non-contrast
Comparison: 05/02/2018 chest radiograph.

CLINICAL DATA: Dyspnea

EXAM:
PORTABLE CHEST 1 VIEW

[chest]
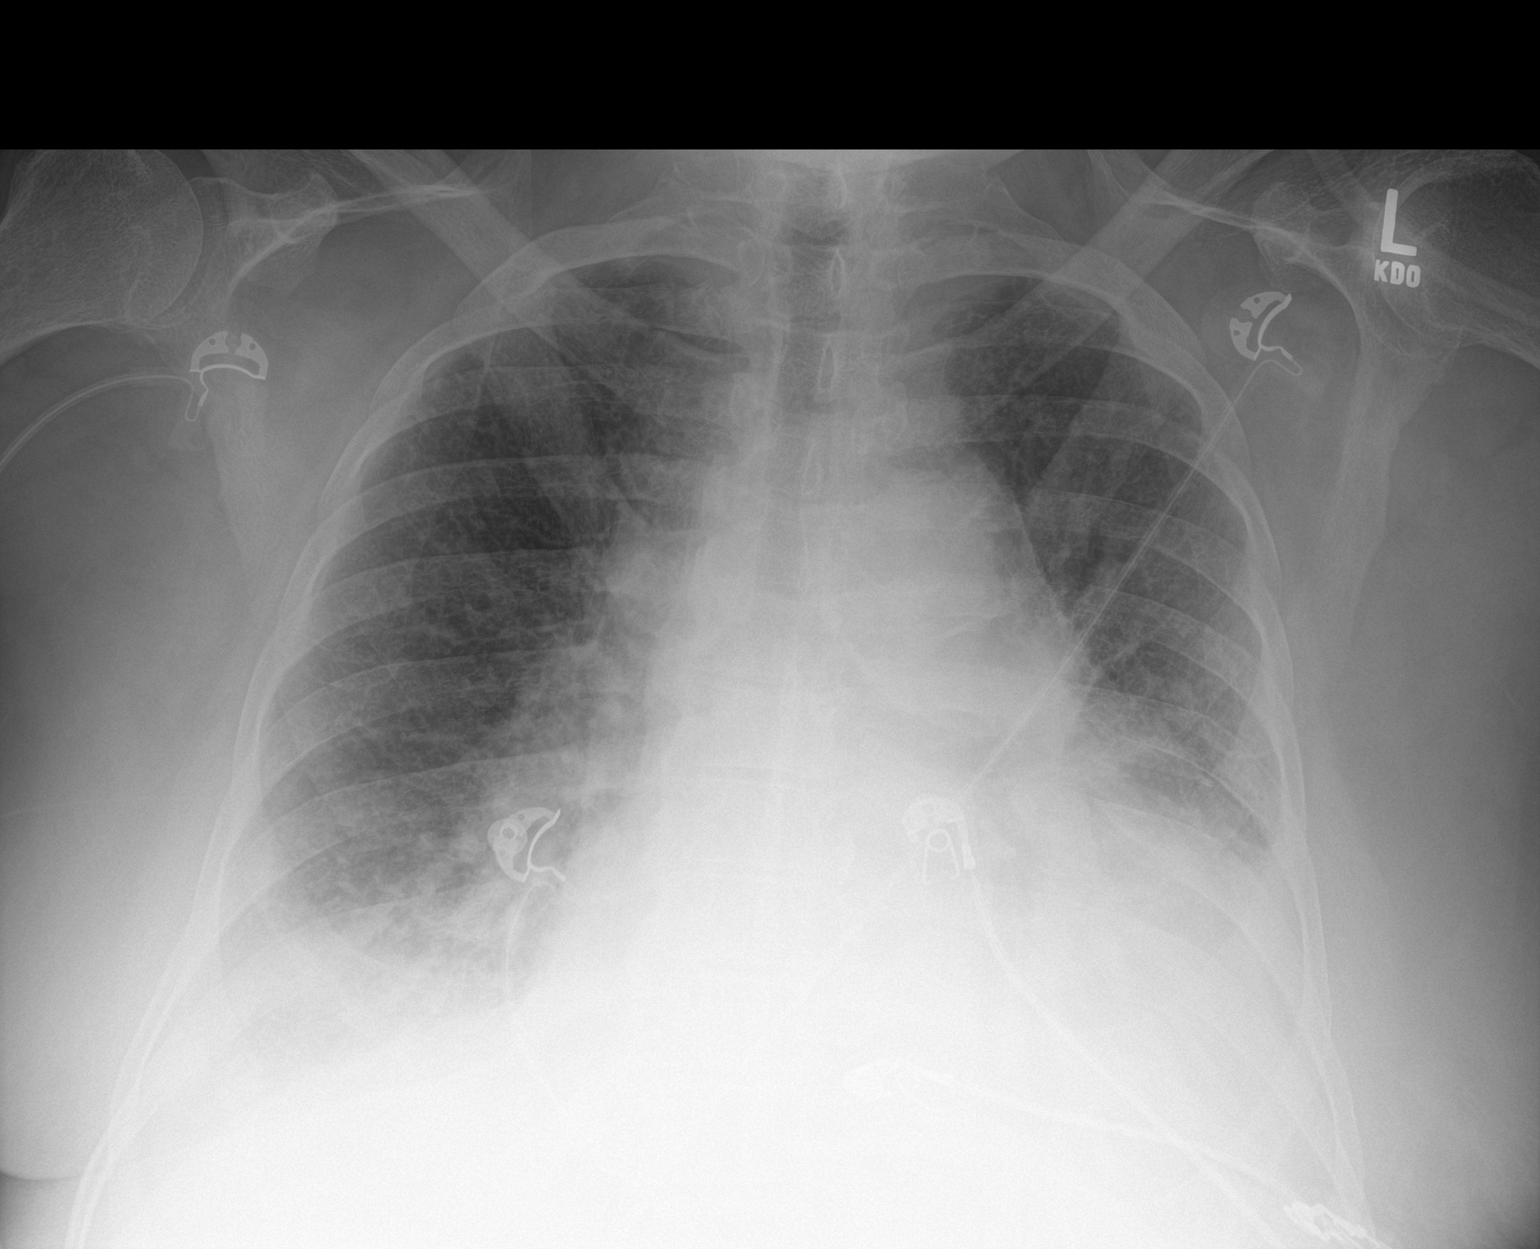

[1 of 1 positions shown; findings below may reference images not displayed]

FINDINGS: Stable cardiomediastinal silhouette with moderate cardiomegaly. No
pneumothorax. Small bilateral pleural effusions, left greater than
right. Mild pulmonary edema. Bibasilar hazy opacity.
IMPRESSION: 1. Moderate cardiomegaly with mild pulmonary edema and small
bilateral pleural effusions, left greater than right, compatible
with mild congestive heart failure.
2. Hazy bibasilar lung opacities, favor atelectasis.

## 2019-12-28 NOTE — Telephone Encounter (Signed)
Referral sent to [facility]. Fax confirmation received.

## 2019-12-29 ENCOUNTER — Other Ambulatory Visit (HOSPITAL_COMMUNITY): Payer: Self-pay

## 2019-12-29 DIAGNOSIS — J9621 Acute and chronic respiratory failure with hypoxia: Secondary | ICD-10-CM | POA: Diagnosis not present

## 2019-12-29 DIAGNOSIS — R269 Unspecified abnormalities of gait and mobility: Secondary | ICD-10-CM | POA: Diagnosis not present

## 2019-12-29 DIAGNOSIS — J441 Chronic obstructive pulmonary disease with (acute) exacerbation: Secondary | ICD-10-CM | POA: Diagnosis not present

## 2019-12-29 DIAGNOSIS — J449 Chronic obstructive pulmonary disease, unspecified: Secondary | ICD-10-CM | POA: Diagnosis not present

## 2019-12-29 NOTE — Progress Notes (Signed)
Paramedicine Encounter    Patient ID: Nicholas Caldwell, male    DOB: 1946/09/09, 74 y.o.   MRN: [id number]   Patient Care Team: [name], MD as PCP - General (Internal Medicine) Jettie Booze, MD as Consulting Physician (Cardiology) Sharmon Revere as Physician Assistant (Cardiology) [name], MD as Attending Physician (Internal Medicine) Thompson Grayer, MD as Consulting Physician (Clinical Cardiac Electrophysiology) [name], MD (Internal Medicine)  Patient Active Problem List   Diagnosis Date Noted  . Acute respiratory failure with hypercapnia ([place])   . Encounter for intubation   . Pressure injury of skin 08/21/2019  . Cardiac arrest ([place]) 08/20/2019  . Palliative care by specialist   . DNR (do not resuscitate) discussion   . Fatigue   . Acute CHF (congestive heart failure) ([place]) 07/07/2019  . Acute on chronic heart failure ([place]) 06/09/2019  . Lobar pneumonia ([place]) 04/14/2019  . Hyperkalemia 04/10/2019  . Cerebral embolism with cerebral infarction 03/21/2019  . Gastritis and gastroduodenitis   . NSVT (nonsustained ventricular tachycardia) ([place]) 03/08/2019  . Tobacco abuse counseling 03/08/2019  . Iron deficiency anemia   . Acute on chronic systolic CHF (congestive heart failure) ([place]) 03/06/2019  . Diabetes mellitus type 2 in obese ([place])   . Primary osteoarthritis of right hip   . Hypomagnesemia   . Steroid-induced hyperglycemia   . Supplemental oxygen dependent   . Leukocytosis   . Hypokalemia   . Acute on chronic anemia   . Diabetic peripheral neuropathy ([place])   . Acute on chronic systolic (congestive) heart failure ([place])   . Acute on chronic respiratory failure ([place]) 01/14/2019  . Hypoxia   . Altered mental status   . Heart failure ([place]) 12/30/2018  . AKI (acute kidney injury) ([place])   . Acute respiratory failure ([place]) 11/22/2018  . Acute on chronic respiratory failure with hypoxia and hypercapnia ([place]) 11/13/2018  . Metabolic  encephalopathy 99/37/1696  . Acute respiratory failure with hypoxia and hypercapnia ([place]) 07/26/2018  . Acute exacerbation of CHF (congestive heart failure) ([place]) 07/26/2018  . CHF exacerbation ([place]) 05/01/2018  . CHF (congestive heart failure) ([place]) 04/30/2018  . Chronic respiratory failure with hypoxia ([place]) 12/28/2017  . Acute encephalopathy   . Atrial fibrillation with RVR ([place])   . SVT (supraventricular tachycardia) ([place])   . COPD GOLD 0   . Medically noncompliant   . Panlobular emphysema ([place])   . OSA (obstructive sleep apnea)   . Pulmonary hypertension ([place])   . Disorientation   . Pressure injury of skin 09/07/2016  . Acute on chronic combined systolic and diastolic CHF (congestive heart failure) ([place]) 09/05/2016  . Skin lesion-left heal 09/05/2016  . Chronic systolic CHF (congestive heart failure) ([place]) 07/16/2016  . COPD (chronic obstructive pulmonary disease) ([place]) 07/16/2016  . Uncontrolled type 2 diabetes mellitus with complication ([place])   . Diabetic polyneuropathy associated with diabetes mellitus due to underlying condition ([place])   . Normocytic anemia 06/29/2016  . CAD - Non-obstructive by LHC1/16 01/21/2016  . Prolonged QT interval 10/09/2015  . Nonischemic cardiomyopathy ([place]) 10/09/2015  . PAF (paroxysmal atrial fibrillation) ([place])   . Chronic pain 02/19/2015  . Morbid obesity ([place]) 02/13/2015  . Dyspnea   . Elevated troponin I level 11/01/2014  . COPD exacerbation ([place]) 11/01/2014  . Essential hypertension 10/31/2014  . Type 2 diabetes mellitus with neuropathy 10/31/2014  . Hyperlipidemia  10/31/2014  . Cigarette smoker 10/31/2014    Current Outpatient Medications:  .  albuterol (VENTOLIN HFA) 108 (90 Base)  MCG/ACT inhaler, Inhale 2 puffs into the lungs every 6 (six) hours as needed for wheezing or shortness of breath., Disp: , Rfl:  .  amiodarone (PACERONE) 100 MG tablet, Take 1 tablet (100 mg total) by mouth daily., Disp: 30 tablet, Rfl: 5 .  apixaban  (ELIQUIS) 5 MG TABS tablet, Take 1 tablet (5 mg total) by mouth 2 (two) times daily., Disp: 60 tablet, Rfl: 1 .  budesonide-formoterol (SYMBICORT) 160-4.5 MCG/ACT inhaler, Inhale 2 puffs into the lungs as needed (shortness of breath). , Disp: , Rfl:  .  Ensure Max Protein (ENSURE MAX PROTEIN) LIQD, Take 330 mLs (11 oz total) by mouth daily., Disp:  , Rfl:  .  famotidine (PEPCID) 20 MG tablet, Take 1 tablet (20 mg total) by mouth 2 (two) times daily., Disp: 60 tablet, Rfl: 2 .  FEROSUL 325 (65 Fe) MG tablet, Take 325 mg by mouth 2 (two) times daily., Disp: , Rfl:  .  fluticasone (FLONASE) 50 MCG/ACT nasal spray, Place 2 sprays into both nostrils daily as needed for allergies. , Disp: , Rfl:  .  gabapentin (NEURONTIN) 300 MG capsule, Take 1 capsule (300 mg total) by mouth 2 (two) times daily., Disp: 60 capsule, Rfl: 0 .  hydrocortisone (ANUSOL-HC) 2.5 % rectal cream, Place 1 application rectally 2 (two) times daily as needed for hemorrhoids or itching. , Disp: , Rfl:  .  hydrocortisone 1 % lotion, Apply 1 application topically See admin instructions. Apply small amount to back twice daily for itchy rash as needed, Disp: , Rfl:  .  insulin aspart (NOVOLOG) 100 UNIT/ML injection, Inject 4 Units into the skin 3 (three) times daily with meals., Disp: 10 mL, Rfl: 0 .  insulin aspart protamine - aspart (NOVOLOG MIX 70/30 FLEXPEN) (70-30) 100 UNIT/ML FlexPen, Inject 0.1 mLs (10 Units total) into the skin 2 (two) times daily., Disp: 15 mL, Rfl: 0 .  Insulin Syringe 27G X 1/2" 0.5 ML MISC, 100 Syringes by Does not apply route 3 (three) times daily., Disp: 100 each, Rfl: 1 .  ipratropium-albuterol (DUONEB) 0.5-2.5 (3) MG/3ML SOLN, Take 3 mLs by nebulization every 6 (six) hours as needed (shortness of breath/wheezing)., Disp: 360 mL, Rfl: 0 .  ipratropium-albuterol (DUONEB) 0.5-2.5 (3) MG/3ML SOLN, Take 3 mLs by nebulization every 6 (six) hours as needed., Disp: 360 mL, Rfl: 3 .  metolazone (ZAROXOLYN) 2.5 MG  tablet, Take 1 tablet by mouth 2 times weekly (Patient taking differently: Take 1 tablet by mouth 2 times weekly), Disp: 30 tablet, Rfl: 5 .  oxybutynin (DITROPAN) 5 MG tablet, Take 1 tablet (5 mg total) by mouth 4 (four) times daily., Disp: 120 tablet, Rfl: 0 .  oxyCODONE-acetaminophen (PERCOCET) 10-325 MG tablet, Take 1 tablet by mouth every 6 (six) hours as needed for pain. , Disp: , Rfl:  .  OXYGEN, Inhale 3 L into the lungs continuous. , Disp: , Rfl:  .  polyethylene glycol (MIRALAX / GLYCOLAX) 17 g packet, Take 17 g by mouth daily as needed., Disp: 14 each, Rfl: 0 .  potassium chloride SA (K-DUR) 20 MEQ tablet, Take 3 tablets (60 mEq total) by mouth 2 (two) times daily. With additional 10mq when you take Metolazone., Disp: 90 tablet, Rfl: 3 .  PRESCRIPTION MEDICATION, Cpap, Disp: , Rfl:  .  rosuvastatin (CRESTOR) 20 MG tablet, Take 20 mg by mouth at bedtime., Disp: , Rfl:  .  senna-docusate (SENOKOT-S) 8.6-50 MG tablet, Take 1 tablet by mouth at bedtime as needed for mild constipation., Disp:  30 tablet, Rfl: 0 .  sodium chloride (OCEAN) 0.65 % SOLN nasal spray, Place 2 sprays into both nostrils as needed for congestion. , Disp: , Rfl:  .  spironolactone (ALDACTONE) 25 MG tablet, Take 1 tablet (25 mg total) by mouth daily., Disp: 30 tablet, Rfl: 6 .  torsemide (DEMADEX) 20 MG tablet, Take 4 tablets (80 mg total) by mouth 2 (two) times daily., Disp: 150 tablet, Rfl: 3 .  pantoprazole (PROTONIX) 40 MG tablet, Take 1 tablet (40 mg total) by mouth 2 (two) times daily. (Patient not taking: Reported on 12/07/2019), Disp: 60 tablet, Rfl: 0 No Known Allergies    Social History   Socioeconomic History  . Marital status: Married    Spouse name: Not on file  . Number of children: Not on file  . Years of education: Not on file  . Highest education level: Not on file  Occupational History  . Not on file  Tobacco Use  . Smoking status: Former Smoker    Packs/day: 1.00    Years: 34.00    Pack  years: 34.00    Types: Cigarettes    Quit date: 11/30/2018    Years since quitting: 1.0  . Smokeless tobacco: Never Used  Substance and Sexual Activity  . Alcohol use: No    Alcohol/week: 0.0 standard drinks  . Drug use: No  . Sexual activity: Not on file  Other Topics Concern  . Not on file  Social History Narrative   ** Merged History Encounter **       Social Determinants of Health   Financial Resource Strain:   . Difficulty of Paying Living Expenses: Not on file  Food Insecurity:   . Worried About Charity fundraiser in the Last Year: Not on file  . Ran Out of Food in the Last Year: Not on file  Transportation Needs:   . Lack of Transportation (Medical): Not on file  . Lack of Transportation (Non-Medical): Not on file  Physical Activity:   . Days of Exercise per Week: Not on file  . Minutes of Exercise per Session: Not on file  Stress:   . Feeling of Stress : Not on file  Social Connections:   . Frequency of Communication with Friends and Family: Not on file  . Frequency of Social Gatherings with Friends and Family: Not on file  . Attends Religious Services: Not on file  . Active Member of Clubs or Organizations: Not on file  . Attends Archivist Meetings: Not on file  . Marital Status: Not on file  Intimate Partner Violence:   . Fear of Current or Ex-Partner: Not on file  . Emotionally Abused: Not on file  . Physically Abused: Not on file  . Sexually Abused: Not on file    Physical Exam      Future Appointments  Date Time Provider [place]  01/18/2020  3:30 PM [place] [facility]  02/01/2020  1:50 PM Cirigliano, Vito V, DO [facility]  02/10/2020  2:20 PM Larey Dresser, MD [facility] None    BP 126/60   Pulse 82   Temp 98.2 F (36.8 C)   Resp 18   Wt 243 lb (110.2 kg)   SpO2 99%   BMI 32.96 kg/m  CBG PTA-188 Weight yesterday-243 Last visit weight-248  Pt reports doing well, he states his leg wraps  need to be taken off or rewrapped-it does look like the referral for home health services to  continue was approved. Wife called them but they didn't see it, she called the nurse that came out in the past and she will look into and call family back. I told him I would caution cutting them off for now until we know for sure when nurse will be coming back out as I dont want him to go days without anything on his legs and he starts to retrain fluid in legs again and cause him more problems other than just feeling uncomfortable when he sleeps.  He has a cough at times, which is chronic. Non-productive at times. He denies c/p, no dizziness, no increased sob, legs look good, the wraps are starting to slide down though. Weight is doing well.  meds verified and pill boxes refilled. He is full until 3/15.     Marylouise Stacks, [facility] Paramedic  12/29/19

## 2019-12-30 IMAGING — DX DG CHEST 1V PORT
1 series · 1 of 1 positions shown · non-contrast
Comparison: Chest radiograph 07/25/2018

CLINICAL DATA: Shortness of breath.  Pulmonary edema

EXAM:
PORTABLE CHEST 1 VIEW

[chest ap]
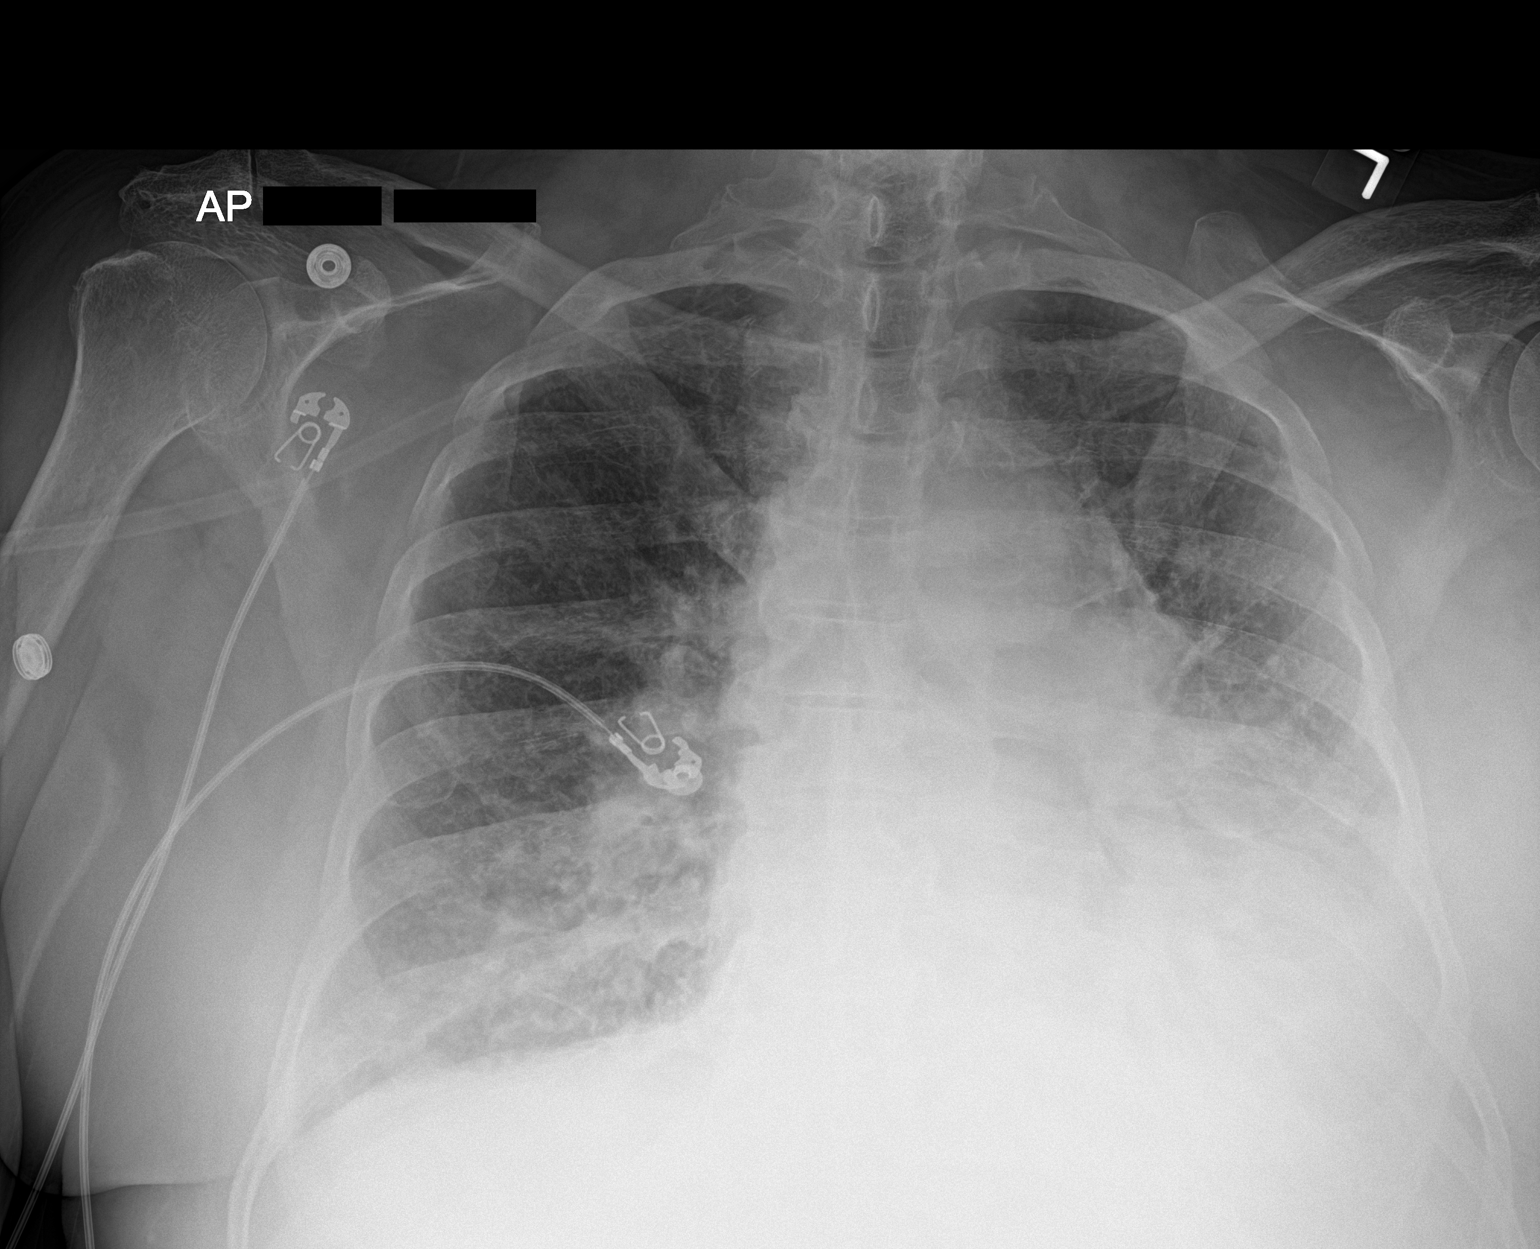

[1 of 1 positions shown; findings below may reference images not displayed]

FINDINGS: Stable cardiomegaly. Diffuse bilateral interstitial opacities. Small
left pleural effusion with underlying opacities.
IMPRESSION: Cardiomegaly and interstitial edema, somewhat worsened from prior.

Small left pleural effusion and underlying opacities.

## 2019-12-31 IMAGING — DX DG CHEST 1V PORT
1 series · 1 of 1 positions shown · non-contrast
Comparison: 07/27/2018

CLINICAL DATA: Respiratory failure

EXAM:
PORTABLE CHEST 1 VIEW

[chest]
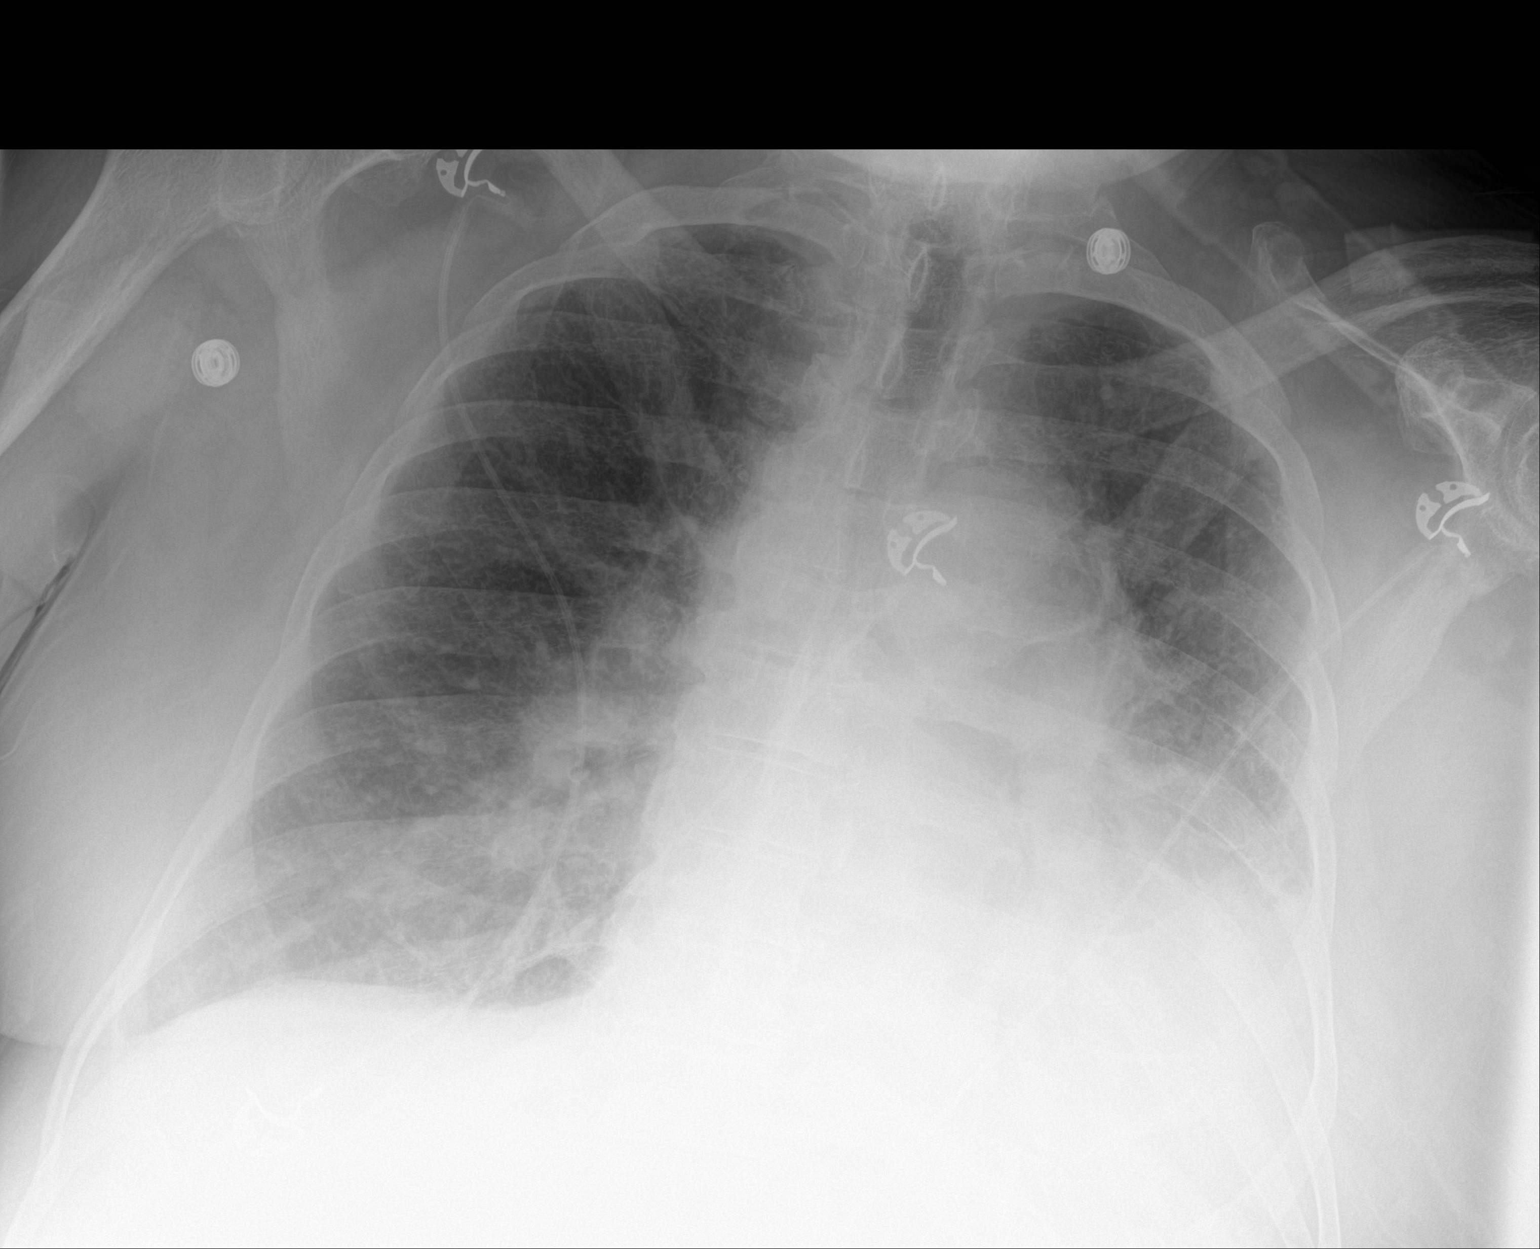

[1 of 1 positions shown; findings below may reference images not displayed]

FINDINGS: Cardiac shadow remains enlarged. Aortic calcifications are again
seen. Vascular congestion is again noted. Left basilar infiltrate
with associated effusion is again seen. No bony abnormality is
noted.
IMPRESSION: Stable vascular congestion.

Stable left basilar infiltrate with associated effusion.

## 2020-01-05 ENCOUNTER — Telehealth (HOSPITAL_COMMUNITY): Payer: Self-pay

## 2020-01-05 NOTE — Telephone Encounter (Signed)
LATE ENTRY 12/27/19.  Referral sent to [facility] on 2/23 regarding extending services for patient per request of family. Response from Doreatha Martin of [facility] report patient was d/c 'd from services on 2/12 as he does not have any skilled nursing needs.    Katie in office today asking about [place].  Family reached out to her inquiring if he will have [place] visits. Advised Katie of response from [place].

## 2020-01-16 ENCOUNTER — Telehealth (HOSPITAL_COMMUNITY): Payer: Self-pay | Admitting: Licensed Clinical Social Worker

## 2020-01-16 NOTE — Telephone Encounter (Signed)
CSW called pt to see if they received or been scheduled to receive the COVID-19 vaccine at this time.  Unable to reach- left message requesting return call to discuss and provide resources to sign up.  Jorge Ny, LCSW Clinical Social Worker [facility] Desk#: [phone] Cell#: [phone]

## 2020-01-17 ENCOUNTER — Other Ambulatory Visit (HOSPITAL_COMMUNITY): Payer: Self-pay

## 2020-01-17 NOTE — Progress Notes (Signed)
Paramedicine Encounter    Patient ID: Nicholas Caldwell, male    DOB: 1946/09/09, 74 y.o.   MRN: [id number]   Patient Care Team: [name], MD as PCP - General (Internal Medicine) Jettie Booze, MD as Consulting Physician (Cardiology) Sharmon Revere as Physician Assistant (Cardiology) [name], MD as Attending Physician (Internal Medicine) Thompson Grayer, MD as Consulting Physician (Clinical Cardiac Electrophysiology) [name], MD (Internal Medicine)  Patient Active Problem List   Diagnosis Date Noted  . Acute respiratory failure with hypercapnia ([place])   . Encounter for intubation   . Pressure injury of skin 08/21/2019  . Cardiac arrest ([place]) 08/20/2019  . Palliative care by specialist   . DNR (do not resuscitate) discussion   . Fatigue   . Acute CHF (congestive heart failure) ([place]) 07/07/2019  . Acute on chronic heart failure ([place]) 06/09/2019  . Lobar pneumonia ([place]) 04/14/2019  . Hyperkalemia 04/10/2019  . Cerebral embolism with cerebral infarction 03/21/2019  . Gastritis and gastroduodenitis   . NSVT (nonsustained ventricular tachycardia) ([place]) 03/08/2019  . Tobacco abuse counseling 03/08/2019  . Iron deficiency anemia   . Acute on chronic systolic CHF (congestive heart failure) ([place]) 03/06/2019  . Diabetes mellitus type 2 in obese ([place])   . Primary osteoarthritis of right hip   . Hypomagnesemia   . Steroid-induced hyperglycemia   . Supplemental oxygen dependent   . Leukocytosis   . Hypokalemia   . Acute on chronic anemia   . Diabetic peripheral neuropathy ([place])   . Acute on chronic systolic (congestive) heart failure ([place])   . Acute on chronic respiratory failure ([place]) 01/14/2019  . Hypoxia   . Altered mental status   . Heart failure ([place]) 12/30/2018  . AKI (acute kidney injury) ([place])   . Acute respiratory failure ([place]) 11/22/2018  . Acute on chronic respiratory failure with hypoxia and hypercapnia ([place]) 11/13/2018  . Metabolic  encephalopathy 99/37/1696  . Acute respiratory failure with hypoxia and hypercapnia ([place]) 07/26/2018  . Acute exacerbation of CHF (congestive heart failure) ([place]) 07/26/2018  . CHF exacerbation ([place]) 05/01/2018  . CHF (congestive heart failure) ([place]) 04/30/2018  . Chronic respiratory failure with hypoxia ([place]) 12/28/2017  . Acute encephalopathy   . Atrial fibrillation with RVR ([place])   . SVT (supraventricular tachycardia) ([place])   . COPD GOLD 0   . Medically noncompliant   . Panlobular emphysema ([place])   . OSA (obstructive sleep apnea)   . Pulmonary hypertension ([place])   . Disorientation   . Pressure injury of skin 09/07/2016  . Acute on chronic combined systolic and diastolic CHF (congestive heart failure) ([place]) 09/05/2016  . Skin lesion-left heal 09/05/2016  . Chronic systolic CHF (congestive heart failure) ([place]) 07/16/2016  . COPD (chronic obstructive pulmonary disease) ([place]) 07/16/2016  . Uncontrolled type 2 diabetes mellitus with complication ([place])   . Diabetic polyneuropathy associated with diabetes mellitus due to underlying condition ([place])   . Normocytic anemia 06/29/2016  . CAD - Non-obstructive by LHC1/16 01/21/2016  . Prolonged QT interval 10/09/2015  . Nonischemic cardiomyopathy ([place]) 10/09/2015  . PAF (paroxysmal atrial fibrillation) ([place])   . Chronic pain 02/19/2015  . Morbid obesity ([place]) 02/13/2015  . Dyspnea   . Elevated troponin I level 11/01/2014  . COPD exacerbation ([place]) 11/01/2014  . Essential hypertension 10/31/2014  . Type 2 diabetes mellitus with neuropathy 10/31/2014  . Hyperlipidemia  10/31/2014  . Cigarette smoker 10/31/2014    Current Outpatient Medications:  .  albuterol (VENTOLIN HFA) 108 (90 Base)  MCG/ACT inhaler, Inhale 2 puffs into the lungs every 6 (six) hours as needed for wheezing or shortness of breath., Disp: , Rfl:  .  amiodarone (PACERONE) 100 MG tablet, Take 1 tablet (100 mg total) by mouth daily., Disp: 30 tablet, Rfl: 5 .  apixaban  (ELIQUIS) 5 MG TABS tablet, Take 1 tablet (5 mg total) by mouth 2 (two) times daily., Disp: 60 tablet, Rfl: 1 .  budesonide-formoterol (SYMBICORT) 160-4.5 MCG/ACT inhaler, Inhale 2 puffs into the lungs as needed (shortness of breath). , Disp: , Rfl:  .  Ensure Max Protein (ENSURE MAX PROTEIN) LIQD, Take 330 mLs (11 oz total) by mouth daily., Disp:  , Rfl:  .  famotidine (PEPCID) 20 MG tablet, Take 1 tablet (20 mg total) by mouth 2 (two) times daily., Disp: 60 tablet, Rfl: 2 .  FEROSUL 325 (65 Fe) MG tablet, Take 325 mg by mouth 2 (two) times daily., Disp: , Rfl:  .  fluticasone (FLONASE) 50 MCG/ACT nasal spray, Place 2 sprays into both nostrils daily as needed for allergies. , Disp: , Rfl:  .  gabapentin (NEURONTIN) 300 MG capsule, Take 1 capsule (300 mg total) by mouth 2 (two) times daily., Disp: 60 capsule, Rfl: 0 .  hydrocortisone (ANUSOL-HC) 2.5 % rectal cream, Place 1 application rectally 2 (two) times daily as needed for hemorrhoids or itching. , Disp: , Rfl:  .  hydrocortisone 1 % lotion, Apply 1 application topically See admin instructions. Apply small amount to back twice daily for itchy rash as needed, Disp: , Rfl:  .  insulin aspart (NOVOLOG) 100 UNIT/ML injection, Inject 4 Units into the skin 3 (three) times daily with meals., Disp: 10 mL, Rfl: 0 .  insulin aspart protamine - aspart (NOVOLOG MIX 70/30 FLEXPEN) (70-30) 100 UNIT/ML FlexPen, Inject 0.1 mLs (10 Units total) into the skin 2 (two) times daily., Disp: 15 mL, Rfl: 0 .  Insulin Syringe 27G X 1/2" 0.5 ML MISC, 100 Syringes by Does not apply route 3 (three) times daily., Disp: 100 each, Rfl: 1 .  ipratropium-albuterol (DUONEB) 0.5-2.5 (3) MG/3ML SOLN, Take 3 mLs by nebulization every 6 (six) hours as needed (shortness of breath/wheezing)., Disp: 360 mL, Rfl: 0 .  ipratropium-albuterol (DUONEB) 0.5-2.5 (3) MG/3ML SOLN, Take 3 mLs by nebulization every 6 (six) hours as needed., Disp: 360 mL, Rfl: 3 .  metolazone (ZAROXOLYN) 2.5 MG  tablet, Take 1 tablet by mouth 2 times weekly (Patient taking differently: Take 1 tablet by mouth 2 times weekly), Disp: 30 tablet, Rfl: 5 .  oxybutynin (DITROPAN) 5 MG tablet, Take 1 tablet (5 mg total) by mouth 4 (four) times daily., Disp: 120 tablet, Rfl: 0 .  oxyCODONE-acetaminophen (PERCOCET) 10-325 MG tablet, Take 1 tablet by mouth every 6 (six) hours as needed for pain. , Disp: , Rfl:  .  OXYGEN, Inhale 3 L into the lungs continuous. , Disp: , Rfl:  .  pantoprazole (PROTONIX) 40 MG tablet, Take 1 tablet (40 mg total) by mouth 2 (two) times daily. (Patient not taking: Reported on 12/07/2019), Disp: 60 tablet, Rfl: 0 .  polyethylene glycol (MIRALAX / GLYCOLAX) 17 g packet, Take 17 g by mouth daily as needed., Disp: 14 each, Rfl: 0 .  potassium chloride SA (K-DUR) 20 MEQ tablet, Take 3 tablets (60 mEq total) by mouth 2 (two) times daily. With additional 54mq when you take Metolazone., Disp: 90 tablet, Rfl: 3 .  PRESCRIPTION MEDICATION, Cpap, Disp: , Rfl:  .  rosuvastatin (CRESTOR) 20 MG tablet, Take  20 mg by mouth at bedtime., Disp: , Rfl:  .  senna-docusate (SENOKOT-S) 8.6-50 MG tablet, Take 1 tablet by mouth at bedtime as needed for mild constipation., Disp: 30 tablet, Rfl: 0 .  sodium chloride (OCEAN) 0.65 % SOLN nasal spray, Place 2 sprays into both nostrils as needed for congestion. , Disp: , Rfl:  .  spironolactone (ALDACTONE) 25 MG tablet, Take 1 tablet (25 mg total) by mouth daily., Disp: 30 tablet, Rfl: 6 .  torsemide (DEMADEX) 20 MG tablet, Take 4 tablets (80 mg total) by mouth 2 (two) times daily., Disp: 150 tablet, Rfl: 3 No Known Allergies    Social History   Socioeconomic History  . Marital status: Married    Spouse name: Not on file  . Number of children: Not on file  . Years of education: Not on file  . Highest education level: Not on file  Occupational History  . Not on file  Tobacco Use  . Smoking status: Former Smoker    Packs/day: 1.00    Years: 34.00    Pack  years: 34.00    Types: Cigarettes    Quit date: 11/30/2018    Years since quitting: 1.1  . Smokeless tobacco: Never Used  Substance and Sexual Activity  . Alcohol use: No    Alcohol/week: 0.0 standard drinks  . Drug use: No  . Sexual activity: Not on file  Other Topics Concern  . Not on file  Social History Narrative   ** Merged History Encounter **       Social Determinants of Health   Financial Resource Strain:   . Difficulty of Paying Living Expenses:   Food Insecurity:   . Worried About Charity fundraiser in the Last Year:   . Arboriculturist in the Last Year:   Transportation Needs:   . Film/video editor (Medical):   Marland Kitchen Lack of Transportation (Non-Medical):   Physical Activity:   . Days of Exercise per Week:   . Minutes of Exercise per Session:   Stress:   . Feeling of Stress :   Social Connections:   . Frequency of Communication with Friends and Family:   . Frequency of Social Gatherings with Friends and Family:   . Attends Religious Services:   . Active Member of Clubs or Organizations:   . Attends Archivist Meetings:   Marland Kitchen Marital Status:   Intimate Partner Violence:   . Fear of Current or Ex-Partner:   . Emotionally Abused:   Marland Kitchen Physically Abused:   . Sexually Abused:     Physical Exam      Future Appointments  Date Time Provider [place]  01/18/2020  3:30 PM [facility]  02/01/2020  1:50 PM Cirigliano, Vito V, DO [facility]  02/10/2020  2:20 PM Larey Dresser, MD [facility] None    BP (!) 108/52   Pulse 74   Temp 98.2 F (36.8 C)   Resp 18   Wt 243 lb (110.2 kg)   SpO2 99%   BMI 32.96 kg/m   Weight yesterday-243 Last visit weight-243  Pt reports doing good, he gets sob upon far walking or too much exertion, but just normal things around the house he does good.  He denies c/p, no dizziness. No edema noted to legs.  He is going to get his compression stockings-I called the medical  supply store and they found his order for them so he just has to go  inside so they can measure his legs--his home nurse did measure them over a month ago but since its been so long I told him to go inside so they can make sure he gets right ones. They  plan on doing that tomor.  meds verified and pill box refilled through 3/23.   Marylouise Stacks, [facility] Paramedic  01/17/20

## 2020-01-18 ENCOUNTER — Ambulatory Visit: Payer: No Typology Code available for payment source | Attending: Internal Medicine

## 2020-01-18 DIAGNOSIS — Z23 Encounter for immunization: Secondary | ICD-10-CM

## 2020-01-18 NOTE — Progress Notes (Signed)
   Covid-19 Vaccination Clinic  Name:  Nicholas Caldwell    MRN: [id number] DOB: 1946/05/26  01/18/2020  [name] was observed post Covid-19 immunization for 15 minutes without incident. He was provided with Vaccine Information Sheet and instruction to access the V-Safe system.   [name] was instructed to call 911 with any severe reactions post vaccine: Marland Kitchen Difficulty breathing  . Swelling of face and throat  . A fast heartbeat  . A bad rash all over body  . Dizziness and weakness   Immunizations Administered    Name Date Dose VIS Date Route   Pfizer COVID-19 Vaccine 01/18/2020  3:06 PM 0.3 mL 10/14/2019 Intramuscular   Manufacturer: [place]   Lot: [id number]   [place]: [id number]

## 2020-01-24 ENCOUNTER — Other Ambulatory Visit (HOSPITAL_COMMUNITY): Payer: Self-pay

## 2020-01-24 NOTE — Progress Notes (Signed)
Came out at appointment time, car was gone. Nobody answered. I know if wife is gone he will not come to door and rarely answers the phone.  I waited around for 89mn but nothing so I left. I will call back later and try to resch.   KMarylouise Stacks EMT-Paramedic  01/24/20

## 2020-01-25 DIAGNOSIS — E1122 Type 2 diabetes mellitus with diabetic chronic kidney disease: Secondary | ICD-10-CM | POA: Diagnosis not present

## 2020-01-25 DIAGNOSIS — I2510 Atherosclerotic heart disease of native coronary artery without angina pectoris: Secondary | ICD-10-CM | POA: Diagnosis not present

## 2020-01-25 DIAGNOSIS — I509 Heart failure, unspecified: Secondary | ICD-10-CM | POA: Diagnosis not present

## 2020-01-25 DIAGNOSIS — N179 Acute kidney failure, unspecified: Secondary | ICD-10-CM | POA: Diagnosis not present

## 2020-01-25 DIAGNOSIS — I129 Hypertensive chronic kidney disease with stage 1 through stage 4 chronic kidney disease, or unspecified chronic kidney disease: Secondary | ICD-10-CM | POA: Diagnosis not present

## 2020-01-25 DIAGNOSIS — N2581 Secondary hyperparathyroidism of renal origin: Secondary | ICD-10-CM | POA: Diagnosis not present

## 2020-01-25 DIAGNOSIS — I480 Paroxysmal atrial fibrillation: Secondary | ICD-10-CM | POA: Diagnosis not present

## 2020-01-25 DIAGNOSIS — N184 Chronic kidney disease, stage 4 (severe): Secondary | ICD-10-CM | POA: Diagnosis not present

## 2020-01-25 DIAGNOSIS — D509 Iron deficiency anemia, unspecified: Secondary | ICD-10-CM | POA: Diagnosis not present

## 2020-01-26 ENCOUNTER — Telehealth (HOSPITAL_COMMUNITY): Payer: Self-pay

## 2020-01-26 DIAGNOSIS — J9621 Acute and chronic respiratory failure with hypoxia: Secondary | ICD-10-CM | POA: Diagnosis not present

## 2020-01-26 DIAGNOSIS — J449 Chronic obstructive pulmonary disease, unspecified: Secondary | ICD-10-CM | POA: Diagnosis not present

## 2020-01-26 DIAGNOSIS — R269 Unspecified abnormalities of gait and mobility: Secondary | ICD-10-CM | POA: Diagnosis not present

## 2020-01-26 DIAGNOSIS — J441 Chronic obstructive pulmonary disease with (acute) exacerbation: Secondary | ICD-10-CM | POA: Diagnosis not present

## 2020-01-26 NOTE — Telephone Encounter (Signed)
pts wife called me to cancel todays appointment. I asked if he was feeling ok and he said he was, so not sure why he wanted to cancel today.  He went to kidney doc yesterday and there was some med changes but wife reports that she was able to make those changes. We decided to do home visit on Monday.   Marylouise Stacks, EMT-Paramedic  01/26/20

## 2020-01-30 ENCOUNTER — Other Ambulatory Visit (HOSPITAL_COMMUNITY): Payer: Self-pay

## 2020-01-30 NOTE — Progress Notes (Signed)
Paramedicine Encounter    Patient ID: Nicholas Caldwell, male    DOB: 1946/09/09, 74 y.o.   MRN: [id number]   Patient Care Team: [name], MD as PCP - General (Internal Medicine) Jettie Booze, MD as Consulting Physician (Cardiology) Sharmon Revere as Physician Assistant (Cardiology) [name], MD as Attending Physician (Internal Medicine) Thompson Grayer, MD as Consulting Physician (Clinical Cardiac Electrophysiology) [name], MD (Internal Medicine)  Patient Active Problem List   Diagnosis Date Noted  . Acute respiratory failure with hypercapnia ([place])   . Encounter for intubation   . Pressure injury of skin 08/21/2019  . Cardiac arrest ([place]) 08/20/2019  . Palliative care by specialist   . DNR (do not resuscitate) discussion   . Fatigue   . Acute CHF (congestive heart failure) ([place]) 07/07/2019  . Acute on chronic heart failure ([place]) 06/09/2019  . Lobar pneumonia ([place]) 04/14/2019  . Hyperkalemia 04/10/2019  . Cerebral embolism with cerebral infarction 03/21/2019  . Gastritis and gastroduodenitis   . NSVT (nonsustained ventricular tachycardia) ([place]) 03/08/2019  . Tobacco abuse counseling 03/08/2019  . Iron deficiency anemia   . Acute on chronic systolic CHF (congestive heart failure) ([place]) 03/06/2019  . Diabetes mellitus type 2 in obese ([place])   . Primary osteoarthritis of right hip   . Hypomagnesemia   . Steroid-induced hyperglycemia   . Supplemental oxygen dependent   . Leukocytosis   . Hypokalemia   . Acute on chronic anemia   . Diabetic peripheral neuropathy ([place])   . Acute on chronic systolic (congestive) heart failure ([place])   . Acute on chronic respiratory failure ([place]) 01/14/2019  . Hypoxia   . Altered mental status   . Heart failure ([place]) 12/30/2018  . AKI (acute kidney injury) ([place])   . Acute respiratory failure ([place]) 11/22/2018  . Acute on chronic respiratory failure with hypoxia and hypercapnia ([place]) 11/13/2018  . Metabolic  encephalopathy 99/37/1696  . Acute respiratory failure with hypoxia and hypercapnia ([place]) 07/26/2018  . Acute exacerbation of CHF (congestive heart failure) ([place]) 07/26/2018  . CHF exacerbation ([place]) 05/01/2018  . CHF (congestive heart failure) ([place]) 04/30/2018  . Chronic respiratory failure with hypoxia ([place]) 12/28/2017  . Acute encephalopathy   . Atrial fibrillation with RVR ([place])   . SVT (supraventricular tachycardia) ([place])   . COPD GOLD 0   . Medically noncompliant   . Panlobular emphysema ([place])   . OSA (obstructive sleep apnea)   . Pulmonary hypertension ([place])   . Disorientation   . Pressure injury of skin 09/07/2016  . Acute on chronic combined systolic and diastolic CHF (congestive heart failure) ([place]) 09/05/2016  . Skin lesion-left heal 09/05/2016  . Chronic systolic CHF (congestive heart failure) ([place]) 07/16/2016  . COPD (chronic obstructive pulmonary disease) ([place]) 07/16/2016  . Uncontrolled type 2 diabetes mellitus with complication ([place])   . Diabetic polyneuropathy associated with diabetes mellitus due to underlying condition ([place])   . Normocytic anemia 06/29/2016  . CAD - Non-obstructive by LHC1/16 01/21/2016  . Prolonged QT interval 10/09/2015  . Nonischemic cardiomyopathy ([place]) 10/09/2015  . PAF (paroxysmal atrial fibrillation) ([place])   . Chronic pain 02/19/2015  . Morbid obesity ([place]) 02/13/2015  . Dyspnea   . Elevated troponin I level 11/01/2014  . COPD exacerbation ([place]) 11/01/2014  . Essential hypertension 10/31/2014  . Type 2 diabetes mellitus with neuropathy 10/31/2014  . Hyperlipidemia  10/31/2014  . Cigarette smoker 10/31/2014    Current Outpatient Medications:  .  albuterol (VENTOLIN HFA) 108 (90 Base)  MCG/ACT inhaler, Inhale 2 puffs into the lungs every 6 (six) hours as needed for wheezing or shortness of breath., Disp: , Rfl:  .  amiodarone (PACERONE) 100 MG tablet, Take 1 tablet (100 mg total) by mouth daily., Disp: 30 tablet, Rfl: 5 .  apixaban  (ELIQUIS) 5 MG TABS tablet, Take 1 tablet (5 mg total) by mouth 2 (two) times daily. (Patient taking differently: Take 5 mg by mouth 2 (two) times daily. ), Disp: 60 tablet, Rfl: 1 .  budesonide-formoterol (SYMBICORT) 160-4.5 MCG/ACT inhaler, Inhale 2 puffs into the lungs as needed (shortness of breath). , Disp: , Rfl:  .  Ensure Max Protein (ENSURE MAX PROTEIN) LIQD, Take 330 mLs (11 oz total) by mouth daily., Disp:  , Rfl:  .  famotidine (PEPCID) 20 MG tablet, Take 1 tablet (20 mg total) by mouth 2 (two) times daily., Disp: 60 tablet, Rfl: 2 .  FEROSUL 325 (65 Fe) MG tablet, Take 325 mg by mouth 2 (two) times daily., Disp: , Rfl:  .  fluticasone (FLONASE) 50 MCG/ACT nasal spray, Place 2 sprays into both nostrils daily as needed for allergies. , Disp: , Rfl:  .  gabapentin (NEURONTIN) 300 MG capsule, Take 1 capsule (300 mg total) by mouth 2 (two) times daily. (Patient taking differently: Take 300 mg by mouth 2 (two) times daily. ), Disp: 60 capsule, Rfl: 0 .  hydrocortisone (ANUSOL-HC) 2.5 % rectal cream, Place 1 application rectally 2 (two) times daily as needed for hemorrhoids or itching. , Disp: , Rfl:  .  hydrocortisone 1 % lotion, Apply 1 application topically See admin instructions. Apply small amount to back twice daily for itchy rash as needed, Disp: , Rfl:  .  insulin aspart (NOVOLOG) 100 UNIT/ML injection, Inject 4 Units into the skin 3 (three) times daily with meals., Disp: 10 mL, Rfl: 0 .  insulin aspart protamine - aspart (NOVOLOG MIX 70/30 FLEXPEN) (70-30) 100 UNIT/ML FlexPen, Inject 0.1 mLs (10 Units total) into the skin 2 (two) times daily., Disp: 15 mL, Rfl: 0 .  Insulin Syringe 27G X 1/2" 0.5 ML MISC, 100 Syringes by Does not apply route 3 (three) times daily., Disp: 100 each, Rfl: 1 .  ipratropium-albuterol (DUONEB) 0.5-2.5 (3) MG/3ML SOLN, Take 3 mLs by nebulization every 6 (six) hours as needed (shortness of breath/wheezing)., Disp: 360 mL, Rfl: 0 .  ipratropium-albuterol (DUONEB)  0.5-2.5 (3) MG/3ML SOLN, Take 3 mLs by nebulization every 6 (six) hours as needed., Disp: 360 mL, Rfl: 3 .  metolazone (ZAROXOLYN) 2.5 MG tablet, Take 1 tablet by mouth 2 times weekly (Patient taking differently: Take 1 tablet by mouth 2 times weekly), Disp: 30 tablet, Rfl: 5 .  oxybutynin (DITROPAN) 5 MG tablet, Take 1 tablet (5 mg total) by mouth 4 (four) times daily., Disp: 120 tablet, Rfl: 0 .  oxyCODONE-acetaminophen (PERCOCET) 10-325 MG tablet, Take 1 tablet by mouth every 6 (six) hours as needed for pain. , Disp: , Rfl:  .  OXYGEN, Inhale 3 L into the lungs continuous. , Disp: , Rfl:  .  polyethylene glycol (MIRALAX / GLYCOLAX) 17 g packet, Take 17 g by mouth daily as needed., Disp: 14 each, Rfl: 0 .  potassium chloride SA (K-DUR) 20 MEQ tablet, Take 3 tablets (60 mEq total) by mouth 2 (two) times daily. With additional 53mq when you take Metolazone., Disp: 90 tablet, Rfl: 3 .  PRESCRIPTION MEDICATION, Cpap, Disp: , Rfl:  .  rosuvastatin (CRESTOR) 20 MG tablet, Take 20 mg by mouth  at bedtime., Disp: , Rfl:  .  senna-docusate (SENOKOT-S) 8.6-50 MG tablet, Take 1 tablet by mouth at bedtime as needed for mild constipation., Disp: 30 tablet, Rfl: 0 .  sodium chloride (OCEAN) 0.65 % SOLN nasal spray, Place 2 sprays into both nostrils as needed for congestion. , Disp: , Rfl:  .  spironolactone (ALDACTONE) 25 MG tablet, Take 1 tablet (25 mg total) by mouth daily., Disp: 30 tablet, Rfl: 6 .  torsemide (DEMADEX) 20 MG tablet, Take 4 tablets (80 mg total) by mouth 2 (two) times daily., Disp: 150 tablet, Rfl: 3 .  pantoprazole (PROTONIX) 40 MG tablet, Take 1 tablet (40 mg total) by mouth 2 (two) times daily. (Patient not taking: Reported on 12/07/2019), Disp: 60 tablet, Rfl: 0 No Known Allergies    Social History   Socioeconomic History  . Marital status: Married    Spouse name: Not on file  . Number of children: Not on file  . Years of education: Not on file  . Highest education level: Not on  file  Occupational History  . Not on file  Tobacco Use  . Smoking status: Former Smoker    Packs/day: 1.00    Years: 34.00    Pack years: 34.00    Types: Cigarettes    Quit date: 11/30/2018    Years since quitting: 1.1  . Smokeless tobacco: Never Used  Substance and Sexual Activity  . Alcohol use: No    Alcohol/week: 0.0 standard drinks  . Drug use: No  . Sexual activity: Not on file  Other Topics Concern  . Not on file  Social History Narrative   ** Merged History Encounter **       Social Determinants of Health   Financial Resource Strain:   . Difficulty of Paying Living Expenses:   Food Insecurity:   . Worried About Charity fundraiser in the Last Year:   . Arboriculturist in the Last Year:   Transportation Needs:   . Film/video editor (Medical):   Marland Kitchen Lack of Transportation (Non-Medical):   Physical Activity:   . Days of Exercise per Week:   . Minutes of Exercise per Session:   Stress:   . Feeling of Stress :   Social Connections:   . Frequency of Communication with Friends and Family:   . Frequency of Social Gatherings with Friends and Family:   . Attends Religious Services:   . Active Member of Clubs or Organizations:   . Attends Archivist Meetings:   Marland Kitchen Marital Status:   Intimate Partner Violence:   . Fear of Current or Ex-Partner:   . Emotionally Abused:   Marland Kitchen Physically Abused:   . Sexually Abused:     Physical Exam      Future Appointments  Date Time Provider [place]  02/01/2020  1:50 PM Lavena Bullion, DO [facility]  02/10/2020  2:20 PM Larey Dresser, MD [facility] None    BP (!) 150/72   Pulse 78   Temp (!) 97.3 F (36.3 C)   Resp 18   Wt 247 lb (112 kg)   SpO2 95%   BMI 33.50 kg/m   Weight yesterday-247 Last visit weight-243  Pt reports he is feeling good. They never did get the compression stockings from medical supply store. I told them again to be sure to get it. His legs today look good though.  He denies c/p, no dizziness, he does have increased sob when exertion but  that is nothing abnormal. Lungs clear. meds verified and pill box refilled up through next Tuesday. Will come back Tuesday evening around 4.  Marylouise Stacks, [place] [facility] Paramedic  01/31/20

## 2020-01-31 ENCOUNTER — Other Ambulatory Visit: Payer: Self-pay | Admitting: Nephrology

## 2020-01-31 DIAGNOSIS — N179 Acute kidney failure, unspecified: Secondary | ICD-10-CM

## 2020-01-31 DIAGNOSIS — N189 Chronic kidney disease, unspecified: Secondary | ICD-10-CM

## 2020-01-31 DIAGNOSIS — N184 Chronic kidney disease, stage 4 (severe): Secondary | ICD-10-CM

## 2020-02-01 ENCOUNTER — Ambulatory Visit: Payer: Medicare HMO | Admitting: Gastroenterology

## 2020-02-10 ENCOUNTER — Other Ambulatory Visit: Payer: Self-pay

## 2020-02-10 ENCOUNTER — Ambulatory Visit (HOSPITAL_COMMUNITY)
Admission: RE | Admit: 2020-02-10 | Discharge: 2020-02-10 | Disposition: A | Discharge status: Home / Self Care | Payer: Medicare HMO | Source: Ambulatory Visit | Attending: Cardiology | Admitting: Cardiology

## 2020-02-10 VITALS — BP 118/76 | HR 83

## 2020-02-10 DIAGNOSIS — I5022 Chronic systolic (congestive) heart failure: Secondary | ICD-10-CM

## 2020-02-10 DIAGNOSIS — N1832 Chronic kidney disease, stage 3b: Secondary | ICD-10-CM | POA: Diagnosis not present

## 2020-02-10 DIAGNOSIS — Z8673 Personal history of transient ischemic attack (TIA), and cerebral infarction without residual deficits: Secondary | ICD-10-CM | POA: Insufficient documentation

## 2020-02-10 DIAGNOSIS — I480 Paroxysmal atrial fibrillation: Secondary | ICD-10-CM | POA: Diagnosis not present

## 2020-02-10 DIAGNOSIS — E1122 Type 2 diabetes mellitus with diabetic chronic kidney disease: Secondary | ICD-10-CM | POA: Insufficient documentation

## 2020-02-10 DIAGNOSIS — J449 Chronic obstructive pulmonary disease, unspecified: Secondary | ICD-10-CM | POA: Diagnosis not present

## 2020-02-10 DIAGNOSIS — I130 Hypertensive heart and chronic kidney disease with heart failure and stage 1 through stage 4 chronic kidney disease, or unspecified chronic kidney disease: Secondary | ICD-10-CM | POA: Diagnosis not present

## 2020-02-10 DIAGNOSIS — Z79899 Other long term (current) drug therapy: Secondary | ICD-10-CM | POA: Insufficient documentation

## 2020-02-10 DIAGNOSIS — I5042 Chronic combined systolic (congestive) and diastolic (congestive) heart failure: Secondary | ICD-10-CM | POA: Insufficient documentation

## 2020-02-10 DIAGNOSIS — G4733 Obstructive sleep apnea (adult) (pediatric): Secondary | ICD-10-CM | POA: Insufficient documentation

## 2020-02-10 DIAGNOSIS — I2510 Atherosclerotic heart disease of native coronary artery without angina pectoris: Secondary | ICD-10-CM | POA: Diagnosis not present

## 2020-02-10 LAB — TSH: TSH: 1.487 u[IU]/mL (ref 0.350–4.500)

## 2020-02-10 LAB — COMPREHENSIVE METABOLIC PANEL
ALT: 45 U/L — ABNORMAL HIGH (ref 0–44)
AST: 46 U/L — ABNORMAL HIGH (ref 15–41)
Albumin: 3.5 g/dL (ref 3.5–5.0)
Alkaline Phosphatase: 254 U/L — ABNORMAL HIGH (ref 38–126)
Anion gap: 14 (ref 5–15)
BUN: 81 mg/dL — ABNORMAL HIGH (ref 8–23)
CO2: 27 mmol/L (ref 22–32)
Calcium: 8.7 mg/dL — ABNORMAL LOW (ref 8.9–10.3)
Chloride: 98 mmol/L (ref 98–111)
Creatinine, Ser: 5.03 mg/dL — ABNORMAL HIGH (ref 0.61–1.24)
GFR calc Af Amer: 12 mL/min — ABNORMAL LOW (ref >60)
GFR calc non Af Amer: 11 mL/min — ABNORMAL LOW (ref >60)
Glucose, Bld: 191 mg/dL — ABNORMAL HIGH (ref 70–99)
Potassium: 5.2 mmol/L — ABNORMAL HIGH (ref 3.5–5.1)
Sodium: 139 mmol/L (ref 135–145)
Total Bilirubin: 0.6 mg/dL (ref 0.3–1.2)
Total Protein: 7.5 g/dL (ref 6.5–8.1)

## 2020-02-10 LAB — CBC
HCT: 33.1 % — ABNORMAL LOW (ref 39.0–52.0)
Hemoglobin: 10.1 g/dL — ABNORMAL LOW (ref 13.0–17.0)
MCH: 27.2 pg (ref 26.0–34.0)
MCHC: 30.5 g/dL (ref 30.0–36.0)
MCV: 89.2 fL (ref 80.0–100.0)
Platelets: 522 10*3/uL — ABNORMAL HIGH (ref 150–400)
RBC: 3.71 MIL/uL — ABNORMAL LOW (ref 4.22–5.81)
RDW: 16.7 % — ABNORMAL HIGH (ref 11.5–15.5)
WBC: 8.2 10*3/uL (ref 4.0–10.5)
nRBC: 0 % (ref 0.0–0.2)

## 2020-02-10 NOTE — Progress Notes (Signed)
ReDS Vest / Clip - 02/10/20 1500      ReDS Vest / Clip   Station Marker  D    Ruler Value  38    ReDS Value Range  Low volume    ReDS Actual Value  28    Anatomical Comments  sitting

## 2020-02-10 NOTE — Patient Instructions (Addendum)
NO medication changes  We will renew Mercedes will be referred to Pulmonary Rehab.  You will get a call to schedule this appointment.   Labs today We will only contact you if something comes back abnormal or we need to make some changes. Otherwise no news is good news!  Your physician recommends that you schedule a follow-up appointment in: 6 weeks with the Nurse Practitioner  Please call office at [phone] option 2 if you have any questions or concerns.   At the [facility], you and your health needs are our priority. As part of our continuing mission to provide you with exceptional heart care, we have created designated Provider Care Teams. These Care Teams include your primary Cardiologist (physician) and Advanced Practice Providers (APPs- Physician Assistants and Nurse Practitioners) who all work together to provide you with the care you need, when you need it.   You may see any of the following providers on your designated Care Team at your next follow up: Marland Kitchen Dr Glori Bickers . Dr Loralie Champagne . Darrick Grinder, NP . Lyda Jester, PA . Audry Riles, PharmD   Please be sure to bring in all your medications bottles to every appointment.

## 2020-02-12 NOTE — Progress Notes (Signed)
Heart Failure  Note Date:  02/12/2020   ID:  Nicholas Caldwell, DOB [birth year], MRN [id number]   Provider location: [facility] Type of Visit: Established patient  PCP:  [name], MD  HF Cardiology: Dr [name]   History of Present Illness: Nicholas Caldwell is a 74 y.o. male with a history of chronic systolic CHFdue to NICM (EF 40-45%)with moderate RV failure, COPD on 3 L O2, DM, HTN, OHS/OSA on CPAP, PAF s/p DCCV, hyperlipidemia, CAD, and noncompliance.He has multiple admissions due to HF and respiratory failure.   Most recently admitted from 03/07/19-03/31/19 with massive volume overload with severe RV failure, AFL and cardiorenal syndrome. Attempts at diuresis limited by worsening creatinine which peaked at 2.6. Treated with milrinone but still unable to get CVP below 15. Hospital course c/b severe non-compliance with high fluid intake, PAFL, penile wound, acute versus subacute CVA and Citrobacter UTI. Underwent DC-CV of AFL into NSR.  D/c'd home on 5/28 with weight of 270 and creatinine 1.99. Readmitted6/6/2020with recurrent hypoxic/hypercarbic respiratory failure. (ABG 7.27/66/55/86%). Found to have severe hyperkalemia (K>7.5) and large left pleural effusion. Weightwasup about 10 pounds since d/c.Diuresed with IV lasix and later transitioned to torsemide 80 mg twice a day.  Discharge weight on 04/14/2019 was 251 pounds.   Admitted 0/3/88 with A/C systolic/diastolic heart failure. Bisoprolol was stopped on admit and not continued on discharge. Diuresed with with IV lasix and transitioned back to torsemide 80 mg twice a day + metolazone 4 times a week.   Admitted 06/05/79 with A/C systolic/diastolic heart failure + AECOPD. Diuresed with IV lasix and discharged on torsemide 80 mg twice a day + metolazone 4x a week. Treated with steroids and antibiotics. He was placed on low dose bisoprolol.  Palliative Care consulted. MOST form was introduced. He wished to continue  full code.   He was admitted in 10/20 with RLL PNA (treated with ceftriaxone), CHF exacerbation, and right pleural effusion.  He had right-sided thoracentesis.  He was admitted again in 10/20 with PEA arrest, thought to be due to overuse of narcotic pain meds and underuse of Bipap.  He was noted to be in atrial fibrillation and amiodarone was started, he went back into NSR. He was diuresed for CHF.   Today he returns for followup of CHF.  He remains short of breath walking around the house.  No chest pain.  Occasional fluttering in chest but no long runs of palpitations.  He continues to use home oxygen and CPAP at night. No orthopnea/PND.  No lightheadedness.    ECG (personally reviewed): NSR, 1st degree AVB, iRBBB  REDS clip 28%  Labs (10/20): K 3.3, creatinine 1.7 Labs (11/20): K 3.9, creatinine 2.21, hgb 10.1 Labs (2/21): K 4.1, creatinine 4.01 => 3.48, hgb 8.8, BNP 1176  PMH: 1. Chronic systolic CHF: Nonischemic cardiomyopathy, diagnosed in 2016.  - LHC (1/16) with mild nonobstructive CAD.  - Echo (12/17): EF 35-40%, mildly dilated RV with mild to moderately decreased RV systolic function, PASP 57 mmHg.  - Echo (3/20): EF 45-50%, mild LVH, D-shaped septum with severe RV dilation and RV function.  - TEE (3/20): EF 40-45%, moderate LVH, severe RV dilation with moderate dysfunction.  - RHC (2/20): mean RA 10, PA 52/16 mean 30, mean PCWP 10, CI 3.85, PVR 2.2 WU - Echo (9/20): EF >65%, moderate RV dilation with mildly decreased systolic function.  - Echo (10/20): EF 60-65%, severe RV dilation with severely decreased RV systolic function.  2. Atrial  fibrillation: Paroxysmal. Severe LAE, has seen Dr Rayann Heman and thought to have low chance of success with atrial fibrillation ablation. Tikosyn not used due to long QT. He was on amiodarone in the past but this was stopped due to long QT.  - DCCV 3/20, 5/20.  3. COPD: On 3 L home oxygen.  4. Type II diabetes 5. Hyperlipidemia 6. HTN 7.  OHS/OSA: Home oxygen + CPAP.  8. CAD: LHC (2/20) with 50-60% distal LAD, 50% proximal LCx, 50% proximal RCA (nondominant RCA).  9. CVA: 5/20.  10. CKD: Stage 3.  11. ABIs (9/20): normal.  12. PEA arrest: 10/20, due to overuse of narcotic pain meds and underuse of Bipap.  13. Tremor  Current Outpatient Medications  Medication Sig Dispense Refill  . albuterol (VENTOLIN HFA) 108 (90 Base) MCG/ACT inhaler Inhale 2 puffs into the lungs every 6 (six) hours as needed for wheezing or shortness of breath.    Marland Kitchen amiodarone (PACERONE) 100 MG tablet Take 1 tablet (100 mg total) by mouth daily. 30 tablet 5  . apixaban (ELIQUIS) 5 MG TABS tablet Take 1 tablet (5 mg total) by mouth 2 (two) times daily. (Patient taking differently: Take 5 mg by mouth 2 (two) times daily. ) 60 tablet 1  . budesonide-formoterol (SYMBICORT) 160-4.5 MCG/ACT inhaler Inhale 2 puffs into the lungs as needed (shortness of breath).     . Ensure Max Protein (ENSURE MAX PROTEIN) LIQD Take 330 mLs (11 oz total) by mouth daily.    . famotidine (PEPCID) 20 MG tablet Take 1 tablet (20 mg total) by mouth 2 (two) times daily. 60 tablet 2  . FEROSUL 325 (65 Fe) MG tablet Take 325 mg by mouth 2 (two) times daily.    . fluticasone (FLONASE) 50 MCG/ACT nasal spray Place 2 sprays into both nostrils daily as needed for allergies.     Marland Kitchen gabapentin (NEURONTIN) 300 MG capsule Take 1 capsule (300 mg total) by mouth 2 (two) times daily. (Patient taking differently: Take 300 mg by mouth 2 (two) times daily. ) 60 capsule 0  . hydrocortisone (ANUSOL-HC) 2.5 % rectal cream Place 1 application rectally 2 (two) times daily as needed for hemorrhoids or itching.     . hydrocortisone 1 % lotion Apply 1 application topically See admin instructions. Apply small amount to back twice daily for itchy rash as needed    . insulin aspart (NOVOLOG) 100 UNIT/ML injection Inject 4 Units into the skin 3 (three) times daily with meals. 10 mL 0  . insulin aspart protamine -  aspart (NOVOLOG MIX 70/30 FLEXPEN) (70-30) 100 UNIT/ML FlexPen Inject 0.1 mLs (10 Units total) into the skin 2 (two) times daily. 15 mL 0  . Insulin Syringe 27G X 1/2" 0.5 ML MISC 100 Syringes by Does not apply route 3 (three) times daily. 100 each 1  . ipratropium-albuterol (DUONEB) 0.5-2.5 (3) MG/3ML SOLN Take 3 mLs by nebulization every 6 (six) hours as needed (shortness of breath/wheezing). 360 mL 0  . ipratropium-albuterol (DUONEB) 0.5-2.5 (3) MG/3ML SOLN Take 3 mLs by nebulization every 6 (six) hours as needed. 360 mL 3  . metolazone (ZAROXOLYN) 2.5 MG tablet Take 1 tablet by mouth 2 times weekly (Patient taking differently: Take 1 tablet by mouth 2 times weekly) 30 tablet 5  . oxybutynin (DITROPAN) 5 MG tablet Take 1 tablet (5 mg total) by mouth 4 (four) times daily. 120 tablet 0  . oxyCODONE-acetaminophen (PERCOCET) 10-325 MG tablet Take 1 tablet by mouth every 6 (six) hours as  needed for pain.     . OXYGEN Inhale 3 L into the lungs continuous.     . pantoprazole (PROTONIX) 40 MG tablet Take 1 tablet (40 mg total) by mouth 2 (two) times daily. 60 tablet 0  . polyethylene glycol (MIRALAX / GLYCOLAX) 17 g packet Take 17 g by mouth daily as needed. 14 each 0  . potassium chloride SA (K-DUR) 20 MEQ tablet Take 3 tablets (60 mEq total) by mouth 2 (two) times daily. With additional 77mq when you take Metolazone. 90 tablet 3  . PRESCRIPTION MEDICATION Cpap    . rosuvastatin (CRESTOR) 20 MG tablet Take 20 mg by mouth at bedtime.    . senna-docusate (SENOKOT-S) 8.6-50 MG tablet Take 1 tablet by mouth at bedtime as needed for mild constipation. 30 tablet 0  . sodium chloride (OCEAN) 0.65 % SOLN nasal spray Place 2 sprays into both nostrils as needed for congestion.     .Marland Kitchenspironolactone (ALDACTONE) 25 MG tablet Take 1 tablet (25 mg total) by mouth daily. 30 tablet 6  . torsemide (DEMADEX) 20 MG tablet Take 4 tablets (80 mg total) by mouth 2 (two) times daily. 150 tablet 3   No current  facility-administered medications for this encounter.    Allergies:   Patient has no known allergies.   Social History:  The patient  reports that he quit smoking about 14 months ago. His smoking use included cigarettes. He has a 34.00 pack-year smoking history. He has never used smokeless tobacco. He reports that he does not drink alcohol or use drugs.   Family History:  The patient's family history includes Heart disease in his father and mother; Heart failure in his mother; Hypertension in his mother and sister.   ROS:  Please see the history of present illness.   All other systems are personally reviewed and negative.  Wt Readings from Last 3 Encounters:  01/30/20 112 kg (247 lb)  01/17/20 110.2 kg (243 lb)  12/29/19 110.2 kg (243 lb)   Vitals:   02/10/20 1425  BP: 118/76  Pulse: 83  SpO2: 99%    Exam:   General: NAD Neck: No JVD, no thyromegaly or thyroid nodule.  Lungs: Clear to auscultation bilaterally with normal respiratory effort. CV: Nondisplaced PMI.  Heart regular S1/S2, no S3/S4, no murmur.  No peripheral edema.  No carotid bruit.  Normal pedal pulses.  Abdomen: Soft, nontender, no hepatosplenomegaly, no distention.  Skin: Intact without lesions or rashes.  Neurologic: Alert and oriented x 3.  Psych: Normal affect. Extremities: No clubbing or cyanosis.  HEENT: Normal.   Recent Labs: 08/29/2019: Magnesium 2.0 12/05/2019: B Natriuretic Peptide 1,175.8 02/10/2020: ALT 45; BUN 81; Creatinine, Ser 5.03; Hemoglobin 10.1; Platelets 522; Potassium 5.2; Sodium 139; TSH 1.487  Personally reviewed   Wt Readings from Last 3 Encounters:  01/30/20 112 kg (247 lb)  01/17/20 110.2 kg (243 lb)  12/29/19 110.2 kg (243 lb)      ASSESSMENT AND PLAN:  1. Chronic systolic => diastolic CHF: With prominent RV failure, likely related to COPD/OSA/OHS. TEE in 3/20 with EF 40-45%, severely dilated RV with moderately decreased systolic function. However, repeat echo in 9/20 showed EF  >65% with moderate RV dilation/mildly decreased RV systolic function, and echo in 10/20 showed EF 60-65% with severe RV dilation/severe RV dysfunction. He has had multiple admissions with CHF and COPD exacerbations. CHF is complicated by CKD 3b.  He is chronically NYHA class IIIb.  Despite symptoms, he does not look volume overloaded  today and REDS clip is 28%.  - Continue torsemide 80 mg bid.  Continue metolazone twice a week.  BMET today.   - Continue spironolactone 25 mg daily.  2. Atrial fibrillation: Paroxysmal. He has history of CVA.  He had DCCV in 3/20 and again in 5/20. He failed amiodarone in the past and cannot take Tikosyn due to baseline prolonged QT interval.  However, he was restarted on amiodarone at 10/20 admission and has been in NSR since then.  - Continue apixaban 5 mg bid.   - Poor ablation candidate with LA size - Continue amiodarone 100 mg daily.  Check LFTs/TSH today.  He will need a regular eye exam.  3. COPD: No longer smokes. Continue home oxygen. CT chest showed emphysema.  - He is due for pulmonary followup, will try to arrange.  4. OHS/OSA: Continue 3 L oxygen during the day and CPAP at night.    - As above, arrange pulmonary followup.  5. CAD: Nonobstructive disease in 2/20. No chest pain.  - Continue crestor 20 mg daily. - He is on apixaban so no ASA.  6. CKD: Stage 3b.  BMET today.   Followup with APP in 1 month.   Signed, Loralie Champagne, MD  02/12/2020  [facility] [address] [facility] [place] [address] [phone] (office) [phone] (fax)

## 2020-02-13 ENCOUNTER — Telehealth (HOSPITAL_COMMUNITY): Payer: Self-pay

## 2020-02-13 NOTE — Telephone Encounter (Signed)
I called pt several times and left messages each time prior to our appointment time time  so ensure they were home b/c several times I have been there at our sch time and they had gone and not notified me, so now I have to make sure they are home before driving all the way out there.--but nobody answered nor called me back during the day.  pts wife then called me back at 545pm and asked if I could come out. unfortunately I could not due to time of day and I would have to resch for Thursday at 3. They prefer late afternoon appointments. The next I have avail is Thursday-she is going to check his sch to see if he has other appointments and then call me back.    Marylouise Stacks, EMT-Paramedic  02/13/20

## 2020-02-14 ENCOUNTER — Other Ambulatory Visit (HOSPITAL_COMMUNITY): Payer: Self-pay

## 2020-02-14 ENCOUNTER — Telehealth (HOSPITAL_COMMUNITY): Payer: Self-pay

## 2020-02-14 DIAGNOSIS — I5022 Chronic systolic (congestive) heart failure: Secondary | ICD-10-CM

## 2020-02-14 NOTE — Telephone Encounter (Signed)
Pt aware of results and instructions. Pt verbalized understanding of all.  Pt will rtc on Friday to repeat blood work. Katie of paramedicine made aware of the med changes. She has an appointment with the patient today and she will review instructions with him.

## 2020-02-14 NOTE — Progress Notes (Signed)
Paramedicine Encounter    Patient ID: Nicholas Caldwell, male    DOB: 1946/09/09, 74 y.o.   MRN: [id number]   Patient Care Team: [name], MD as PCP - General (Internal Medicine) Jettie Booze, MD as Consulting Physician (Cardiology) Sharmon Revere as Physician Assistant (Cardiology) [name], MD as Attending Physician (Internal Medicine) Thompson Grayer, MD as Consulting Physician (Clinical Cardiac Electrophysiology) [name], MD (Internal Medicine)  Patient Active Problem List   Diagnosis Date Noted  . Acute respiratory failure with hypercapnia ([place])   . Encounter for intubation   . Pressure injury of skin 08/21/2019  . Cardiac arrest ([place]) 08/20/2019  . Palliative care by specialist   . DNR (do not resuscitate) discussion   . Fatigue   . Acute CHF (congestive heart failure) ([place]) 07/07/2019  . Acute on chronic heart failure ([place]) 06/09/2019  . Lobar pneumonia ([place]) 04/14/2019  . Hyperkalemia 04/10/2019  . Cerebral embolism with cerebral infarction 03/21/2019  . Gastritis and gastroduodenitis   . NSVT (nonsustained ventricular tachycardia) ([place]) 03/08/2019  . Tobacco abuse counseling 03/08/2019  . Iron deficiency anemia   . Acute on chronic systolic CHF (congestive heart failure) ([place]) 03/06/2019  . Diabetes mellitus type 2 in obese ([place])   . Primary osteoarthritis of right hip   . Hypomagnesemia   . Steroid-induced hyperglycemia   . Supplemental oxygen dependent   . Leukocytosis   . Hypokalemia   . Acute on chronic anemia   . Diabetic peripheral neuropathy ([place])   . Acute on chronic systolic (congestive) heart failure ([place])   . Acute on chronic respiratory failure ([place]) 01/14/2019  . Hypoxia   . Altered mental status   . Heart failure ([place]) 12/30/2018  . AKI (acute kidney injury) ([place])   . Acute respiratory failure ([place]) 11/22/2018  . Acute on chronic respiratory failure with hypoxia and hypercapnia ([place]) 11/13/2018  . Metabolic  encephalopathy 99/37/1696  . Acute respiratory failure with hypoxia and hypercapnia ([place]) 07/26/2018  . Acute exacerbation of CHF (congestive heart failure) ([place]) 07/26/2018  . CHF exacerbation ([place]) 05/01/2018  . CHF (congestive heart failure) ([place]) 04/30/2018  . Chronic respiratory failure with hypoxia ([place]) 12/28/2017  . Acute encephalopathy   . Atrial fibrillation with RVR ([place])   . SVT (supraventricular tachycardia) ([place])   . COPD GOLD 0   . Medically noncompliant   . Panlobular emphysema ([place])   . OSA (obstructive sleep apnea)   . Pulmonary hypertension ([place])   . Disorientation   . Pressure injury of skin 09/07/2016  . Acute on chronic combined systolic and diastolic CHF (congestive heart failure) ([place]) 09/05/2016  . Skin lesion-left heal 09/05/2016  . Chronic systolic CHF (congestive heart failure) ([place]) 07/16/2016  . COPD (chronic obstructive pulmonary disease) ([place]) 07/16/2016  . Uncontrolled type 2 diabetes mellitus with complication ([place])   . Diabetic polyneuropathy associated with diabetes mellitus due to underlying condition ([place])   . Normocytic anemia 06/29/2016  . CAD - Non-obstructive by LHC1/16 01/21/2016  . Prolonged QT interval 10/09/2015  . Nonischemic cardiomyopathy ([place]) 10/09/2015  . PAF (paroxysmal atrial fibrillation) ([place])   . Chronic pain 02/19/2015  . Morbid obesity ([place]) 02/13/2015  . Dyspnea   . Elevated troponin I level 11/01/2014  . COPD exacerbation ([place]) 11/01/2014  . Essential hypertension 10/31/2014  . Type 2 diabetes mellitus with neuropathy 10/31/2014  . Hyperlipidemia  10/31/2014  . Cigarette smoker 10/31/2014    Current Outpatient Medications:  .  albuterol (VENTOLIN HFA) 108 (90 Base)  MCG/ACT inhaler, Inhale 2 puffs into the lungs every 6 (six) hours as needed for wheezing or shortness of breath., Disp: , Rfl:  .  amiodarone (PACERONE) 100 MG tablet, Take 1 tablet (100 mg total) by mouth daily., Disp: 30 tablet, Rfl: 5 .  apixaban  (ELIQUIS) 5 MG TABS tablet, Take 1 tablet (5 mg total) by mouth 2 (two) times daily. (Patient taking differently: Take 5 mg by mouth 2 (two) times daily. ), Disp: 60 tablet, Rfl: 1 .  budesonide-formoterol (SYMBICORT) 160-4.5 MCG/ACT inhaler, Inhale 2 puffs into the lungs as needed (shortness of breath). , Disp: , Rfl:  .  Ensure Max Protein (ENSURE MAX PROTEIN) LIQD, Take 330 mLs (11 oz total) by mouth daily., Disp:  , Rfl:  .  famotidine (PEPCID) 20 MG tablet, Take 1 tablet (20 mg total) by mouth 2 (two) times daily., Disp: 60 tablet, Rfl: 2 .  FEROSUL 325 (65 Fe) MG tablet, Take 325 mg by mouth 2 (two) times daily., Disp: , Rfl:  .  fluticasone (FLONASE) 50 MCG/ACT nasal spray, Place 2 sprays into both nostrils daily as needed for allergies. , Disp: , Rfl:  .  gabapentin (NEURONTIN) 300 MG capsule, Take 1 capsule (300 mg total) by mouth 2 (two) times daily. (Patient taking differently: Take 300 mg by mouth 2 (two) times daily. ), Disp: 60 capsule, Rfl: 0 .  hydrocortisone (ANUSOL-HC) 2.5 % rectal cream, Place 1 application rectally 2 (two) times daily as needed for hemorrhoids or itching. , Disp: , Rfl:  .  hydrocortisone 1 % lotion, Apply 1 application topically See admin instructions. Apply small amount to back twice daily for itchy rash as needed, Disp: , Rfl:  .  insulin aspart (NOVOLOG) 100 UNIT/ML injection, Inject 4 Units into the skin 3 (three) times daily with meals., Disp: 10 mL, Rfl: 0 .  insulin aspart protamine - aspart (NOVOLOG MIX 70/30 FLEXPEN) (70-30) 100 UNIT/ML FlexPen, Inject 0.1 mLs (10 Units total) into the skin 2 (two) times daily., Disp: 15 mL, Rfl: 0 .  Insulin Syringe 27G X 1/2" 0.5 ML MISC, 100 Syringes by Does not apply route 3 (three) times daily., Disp: 100 each, Rfl: 1 .  ipratropium-albuterol (DUONEB) 0.5-2.5 (3) MG/3ML SOLN, Take 3 mLs by nebulization every 6 (six) hours as needed (shortness of breath/wheezing)., Disp: 360 mL, Rfl: 0 .  ipratropium-albuterol (DUONEB)  0.5-2.5 (3) MG/3ML SOLN, Take 3 mLs by nebulization every 6 (six) hours as needed., Disp: 360 mL, Rfl: 3 .  oxybutynin (DITROPAN) 5 MG tablet, Take 1 tablet (5 mg total) by mouth 4 (four) times daily., Disp: 120 tablet, Rfl: 0 .  oxyCODONE-acetaminophen (PERCOCET) 10-325 MG tablet, Take 1 tablet by mouth every 6 (six) hours as needed for pain. , Disp: , Rfl:  .  OXYGEN, Inhale 3 L into the lungs continuous. , Disp: , Rfl:  .  pantoprazole (PROTONIX) 40 MG tablet, Take 1 tablet (40 mg total) by mouth 2 (two) times daily., Disp: 60 tablet, Rfl: 0 .  polyethylene glycol (MIRALAX / GLYCOLAX) 17 g packet, Take 17 g by mouth daily as needed., Disp: 14 each, Rfl: 0 .  potassium chloride SA (K-DUR) 20 MEQ tablet, Take 3 tablets (60 mEq total) by mouth 2 (two) times daily. With additional 7mq when you take Metolazone., Disp: 90 tablet, Rfl: 3 .  PRESCRIPTION MEDICATION, Cpap, Disp: , Rfl:  .  rosuvastatin (CRESTOR) 20 MG tablet, Take 20 mg by mouth at bedtime., Disp: , Rfl:  .  senna-docusate (SENOKOT-S) 8.6-50 MG tablet, Take 1 tablet by mouth at bedtime as needed for mild constipation., Disp: 30 tablet, Rfl: 0 .  sodium chloride (OCEAN) 0.65 % SOLN nasal spray, Place 2 sprays into both nostrils as needed for congestion. , Disp: , Rfl:  .  torsemide (DEMADEX) 20 MG tablet, Take 4 tablets (80 mg total) by mouth 2 (two) times daily., Disp: 150 tablet, Rfl: 3 No Known Allergies    Social History   Socioeconomic History  . Marital status: Married    Spouse name: Not on file  . Number of children: Not on file  . Years of education: Not on file  . Highest education level: Not on file  Occupational History  . Not on file  Tobacco Use  . Smoking status: Former Smoker    Packs/day: 1.00    Years: 34.00    Pack years: 34.00    Types: Cigarettes    Quit date: 11/30/2018    Years since quitting: 1.2  . Smokeless tobacco: Never Used  Substance and Sexual Activity  . Alcohol use: No    Alcohol/week:  0.0 standard drinks  . Drug use: No  . Sexual activity: Not on file  Other Topics Concern  . Not on file  Social History Narrative   ** Merged History Encounter **       Social Determinants of Health   Financial Resource Strain:   . Difficulty of Paying Living Expenses:   Food Insecurity:   . Worried About Charity fundraiser in the Last Year:   . Arboriculturist in the Last Year:   Transportation Needs:   . Film/video editor (Medical):   Marland Kitchen Lack of Transportation (Non-Medical):   Physical Activity:   . Days of Exercise per Week:   . Minutes of Exercise per Session:   Stress:   . Feeling of Stress :   Social Connections:   . Frequency of Communication with Friends and Family:   . Frequency of Social Gatherings with Friends and Family:   . Attends Religious Services:   . Active Member of Clubs or Organizations:   . Attends Archivist Meetings:   Marland Kitchen Marital Status:   Intimate Partner Violence:   . Fear of Current or Ex-Partner:   . Emotionally Abused:   Marland Kitchen Physically Abused:   . Sexually Abused:     Physical Exam      Future Appointments  Date Time Provider [place]  02/17/2020  2:30 PM [facility] None  02/20/2020  2:30 PM Noralyn Pick, NP [facility]  03/12/2020  3:00 PM [facility] PA/NP [facility] None    BP 112/64   Pulse 60   Temp (!) 97.2 F (36.2 C)   Resp 18   Wt 245 lb (111.1 kg)   SpO2 99%   BMI 33.23 kg/m   Weight yesterday-245 Last visit weight-247  Pt reports he is feeling ok, he denies increased sob at rest, sob with exertion,  no dizziness, no Mason/p, no edema noted.  There are numerous med changes today.  They requested portable 02 concentrator--traci has already sent that request in.  He had lots of questions and was very frustrated about med changes and how those meds affected kidneys. He was asking why the docs let them meds hurt his kidneys. I told him if he has all these questions and concerns  to write it down and make appointment with provider to get those answers.  No  missed doses of his meds. meds verified and pill box filled through 4/20.   Marylouise Stacks, [facility] Paramedic  02/14/20

## 2020-02-14 NOTE — Telephone Encounter (Signed)
-----  Message from Larey Dresser, MD sent at 02/13/2020 12:17 AM EDT ----- BMET on Thursday or Friday.

## 2020-02-14 NOTE — Telephone Encounter (Signed)
Pt requesting portable O2 via [employer].  [employer] form completed and submitted. Pt also requesting [facility] for skilled nursing. OK per Dr [name]. [facility] currently does not have staffing to provide services. Referral faxed via Epic>

## 2020-02-17 ENCOUNTER — Other Ambulatory Visit: Payer: Self-pay

## 2020-02-17 ENCOUNTER — Ambulatory Visit (HOSPITAL_COMMUNITY)
Admission: RE | Admit: 2020-02-17 | Discharge: 2020-02-17 | Disposition: A | Discharge status: Home / Self Care | Payer: Medicare HMO | Source: Ambulatory Visit | Attending: Cardiology | Admitting: Cardiology

## 2020-02-17 DIAGNOSIS — I5022 Chronic systolic (congestive) heart failure: Secondary | ICD-10-CM | POA: Diagnosis not present

## 2020-02-17 LAB — BASIC METABOLIC PANEL
Anion gap: 11 (ref 5–15)
BUN: 67 mg/dL — ABNORMAL HIGH (ref 8–23)
CO2: 25 mmol/L (ref 22–32)
Calcium: 8.6 mg/dL — ABNORMAL LOW (ref 8.9–10.3)
Chloride: 103 mmol/L (ref 98–111)
Creatinine, Ser: 3.52 mg/dL — ABNORMAL HIGH (ref 0.61–1.24)
GFR calc Af Amer: 19 mL/min — ABNORMAL LOW (ref >60)
GFR calc non Af Amer: 16 mL/min — ABNORMAL LOW (ref >60)
Glucose, Bld: 144 mg/dL — ABNORMAL HIGH (ref 70–99)
Potassium: 4.5 mmol/L (ref 3.5–5.1)
Sodium: 139 mmol/L (ref 135–145)

## 2020-02-18 DIAGNOSIS — E1122 Type 2 diabetes mellitus with diabetic chronic kidney disease: Secondary | ICD-10-CM | POA: Diagnosis not present

## 2020-02-18 DIAGNOSIS — N184 Chronic kidney disease, stage 4 (severe): Secondary | ICD-10-CM | POA: Diagnosis not present

## 2020-02-18 DIAGNOSIS — I480 Paroxysmal atrial fibrillation: Secondary | ICD-10-CM | POA: Diagnosis not present

## 2020-02-18 DIAGNOSIS — D631 Anemia in chronic kidney disease: Secondary | ICD-10-CM | POA: Diagnosis not present

## 2020-02-18 DIAGNOSIS — I130 Hypertensive heart and chronic kidney disease with heart failure and stage 1 through stage 4 chronic kidney disease, or unspecified chronic kidney disease: Secondary | ICD-10-CM | POA: Diagnosis not present

## 2020-02-18 DIAGNOSIS — J439 Emphysema, unspecified: Secondary | ICD-10-CM | POA: Diagnosis not present

## 2020-02-18 DIAGNOSIS — I4892 Unspecified atrial flutter: Secondary | ICD-10-CM | POA: Diagnosis not present

## 2020-02-18 DIAGNOSIS — I5043 Acute on chronic combined systolic (congestive) and diastolic (congestive) heart failure: Secondary | ICD-10-CM | POA: Diagnosis not present

## 2020-02-18 DIAGNOSIS — E1142 Type 2 diabetes mellitus with diabetic polyneuropathy: Secondary | ICD-10-CM | POA: Diagnosis not present

## 2020-02-19 NOTE — Progress Notes (Deleted)
02/19/2020 Alif Petrak [id number] 05-Jul-1946   Chief Complaint:  History of Present Illness:   Nicholas Caldwell is a 74 y.o. male with a past medical history as listed below including paroxysmal A. fib on Eliquis, COPD on 3 L oxygen, CHF with an EF 40-45%, diabetes, wheelchair-bound and hypertension, who presented to the hospital on 08/20/2019 status post PEA cardiac arrest.  During his post cardiac arrest hospitalization, he was evaluated by Dr. Rush Landmark and Ellouise Newer PA-C for normocytic anemia and + FOBT. He did not demonstrate any evidence of active GI bleeding and he did not wish to pursue any endoscopic evaluation during this admission. He potentially considered further follow up at the [facility] hospital.   In review of his records,   EGD 03/11/2019 by Dr. Bryan Lemma: - Normal esophagus. - Erythematous, edematous, and necrotic appearing mucosa in the gastric fundus and gastric body. The endoscopic appearance is worrisome for underlying malignant etiology. This was extensively biopsied. - Gastritis. - Normal duodenal bulb, first portion of the duodenum and second portion of the Duodenum  Colonoscopy 03/11/2019:  - Preparation of the colon was poor. - Diverticulosis in the ascending colon. - Stool in the entire examined colon. - The visualized mucosa was otherwise normal appearing throughout the colon. There were   no areas of bleeding or stigmata of recent bleeding noted. - Non-bleeding internal hemorrhoids. - No specimens collected.  Small bowel enteroscopy 03/15/2019 by Dr. Rush Landmark:  - No gross lesions in esophagus. - Congested, erythematous, friable (with contact bleeding), granular and inflamed mucosa in the cardia and gastric body. Biopsied. - Non-bleeding erosive gastropathy. Biopsied for HP. - Non-bleeding gastric ulcer with a clean ulcer base (Forrest Class III). - Normal mucosa was found in the entire examined duodenum. - Normal mucosa was found in the proximal  jejunum. Tattooed distal extent.    Hospital course:  -Initially patient with PEA arrest likely due to pain meds and noncompliance with BiPAP, extubated 10/19, - transferred to the floor 10/21 found to be obtunded (had had oxycodone) and unable to protect airway with hypercarbic respiratory failure and was intubated again - extubated 10/22, now on nasal cannula.  On p.o. diuretics, respiratory status stable.  Creatinine improving.   -08/28/2019 CT abdomen pelvis without contrast due to abdominal distention with nausea and vomiting which showed bilateral lower lobe infiltrates with right-sided pleural effusion, increased somewhat in the interval from prior exam, stable left adrenal lesion likely representing an adenoma and other chronic changes.  CBC Latest Ref Rng & Units 02/10/2020 12/05/2019 11/21/2019  WBC 4.0 - 10.5 K/uL 8.2 6.7 9.4  Hemoglobin 13.0 - 17.0 g/dL 10.1(L) 8.8(L) 9.2(L)  Hematocrit 39.0 - 52.0 % 33.1(L) 29.6(L) 30.1(L)  Platelets 150 - 400 K/uL 522(H) 512(H) 336   CMP Latest Ref Rng & Units 02/17/2020 02/10/2020 12/05/2019  Glucose 70 - 99 mg/dL 144(H) 191(H) 124(H)  BUN 8 - 23 mg/dL 67(H) 81(H) 59(H)  Creatinine 0.61 - 1.24 mg/dL 3.52(H) 5.03(H) 3.48(H)  Sodium 135 - 145 mmol/L 139 139 138  Potassium 3.5 - 5.1 mmol/L 4.5 5.2(H) 4.1  Chloride 98 - 111 mmol/L 103 98 97(L)  CO2 22 - 32 mmol/L _0 Calcium 8.9 - 10.3 mg/dL 8.6(L) 8.7(L) 8.1(L)  Total Protein 6.5 - 8.1 g/dL - 7.5 -  Total Bilirubin 0.3 - 1.2 mg/dL - 0.6 -  Alkaline Phos 38 - 126 U/L - 254(H) -  AST 15 - 41 U/L - 46(H) -  ALT 0 - 44 U/L -  45(H) -   Current Medications, Allergies, Past Medical History, Past Surgical History, Family History and Social History were reviewed in [employer] record.   Physical Exam: There were no vitals taken for this visit. General: Well developed, w   ***male in no acute distress. Head: Normocephalic and atraumatic. Eyes: No scleral icterus. Conjunctiva  pink . Ears: Normal auditory acuity. Mouth: Dentition intact. No ulcers or lesions.  Lungs: Clear throughout to auscultation. Heart: Regular rate and rhythm, no murmur. Abdomen: Soft, nontender and nondistended. No masses or hepatomegaly. Normal bowel sounds x 4 quadrants.  Rectal: *** Musculoskeletal: Symmetrical with no gross deformities. Extremities: No edema. Neurological: Alert oriented x 4. No focal deficits.  Psychological: Alert and cooperative. Normal mood and affect  Assessment and Recommendations: ***

## 2020-02-20 ENCOUNTER — Ambulatory Visit: Payer: Medicare HMO | Admitting: Nurse Practitioner

## 2020-02-22 ENCOUNTER — Other Ambulatory Visit (HOSPITAL_COMMUNITY): Payer: Self-pay

## 2020-02-22 NOTE — Progress Notes (Signed)
Paramedicine Encounter    Patient ID: Nicholas Caldwell, male    DOB: 1946/09/09, 74 y.o.   MRN: [id number]   Patient Care Team: [name], MD as PCP - General (Internal Medicine) Jettie Booze, MD as Consulting Physician (Cardiology) Sharmon Revere as Physician Assistant (Cardiology) [name], MD as Attending Physician (Internal Medicine) Thompson Grayer, MD as Consulting Physician (Clinical Cardiac Electrophysiology) [name], MD (Internal Medicine)  Patient Active Problem List   Diagnosis Date Noted  . Acute respiratory failure with hypercapnia ([place])   . Encounter for intubation   . Pressure injury of skin 08/21/2019  . Cardiac arrest ([place]) 08/20/2019  . Palliative care by specialist   . DNR (do not resuscitate) discussion   . Fatigue   . Acute CHF (congestive heart failure) ([place]) 07/07/2019  . Acute on chronic heart failure ([place]) 06/09/2019  . Lobar pneumonia ([place]) 04/14/2019  . Hyperkalemia 04/10/2019  . Cerebral embolism with cerebral infarction 03/21/2019  . Gastritis and gastroduodenitis   . NSVT (nonsustained ventricular tachycardia) ([place]) 03/08/2019  . Tobacco abuse counseling 03/08/2019  . Iron deficiency anemia   . Acute on chronic systolic CHF (congestive heart failure) ([place]) 03/06/2019  . Diabetes mellitus type 2 in obese ([place])   . Primary osteoarthritis of right hip   . Hypomagnesemia   . Steroid-induced hyperglycemia   . Supplemental oxygen dependent   . Leukocytosis   . Hypokalemia   . Acute on chronic anemia   . Diabetic peripheral neuropathy ([place])   . Acute on chronic systolic (congestive) heart failure ([place])   . Acute on chronic respiratory failure ([place]) 01/14/2019  . Hypoxia   . Altered mental status   . Heart failure ([place]) 12/30/2018  . AKI (acute kidney injury) ([place])   . Acute respiratory failure ([place]) 11/22/2018  . Acute on chronic respiratory failure with hypoxia and hypercapnia ([place]) 11/13/2018  . Metabolic  encephalopathy 99/37/1696  . Acute respiratory failure with hypoxia and hypercapnia ([place]) 07/26/2018  . Acute exacerbation of CHF (congestive heart failure) ([place]) 07/26/2018  . CHF exacerbation ([place]) 05/01/2018  . CHF (congestive heart failure) ([place]) 04/30/2018  . Chronic respiratory failure with hypoxia ([place]) 12/28/2017  . Acute encephalopathy   . Atrial fibrillation with RVR ([place])   . SVT (supraventricular tachycardia) ([place])   . COPD GOLD 0   . Medically noncompliant   . Panlobular emphysema ([place])   . OSA (obstructive sleep apnea)   . Pulmonary hypertension ([place])   . Disorientation   . Pressure injury of skin 09/07/2016  . Acute on chronic combined systolic and diastolic CHF (congestive heart failure) ([place]) 09/05/2016  . Skin lesion-left heal 09/05/2016  . Chronic systolic CHF (congestive heart failure) ([place]) 07/16/2016  . COPD (chronic obstructive pulmonary disease) ([place]) 07/16/2016  . Uncontrolled type 2 diabetes mellitus with complication ([place])   . Diabetic polyneuropathy associated with diabetes mellitus due to underlying condition ([place])   . Normocytic anemia 06/29/2016  . CAD - Non-obstructive by LHC1/16 01/21/2016  . Prolonged QT interval 10/09/2015  . Nonischemic cardiomyopathy ([place]) 10/09/2015  . PAF (paroxysmal atrial fibrillation) ([place])   . Chronic pain 02/19/2015  . Morbid obesity ([place]) 02/13/2015  . Dyspnea   . Elevated troponin I level 11/01/2014  . COPD exacerbation ([place]) 11/01/2014  . Essential hypertension 10/31/2014  . Type 2 diabetes mellitus with neuropathy 10/31/2014  . Hyperlipidemia  10/31/2014  . Cigarette smoker 10/31/2014    Current Outpatient Medications:  .  albuterol (VENTOLIN HFA) 108 (90 Base)  MCG/ACT inhaler, Inhale 2 puffs into the lungs every 6 (six) hours as needed for wheezing or shortness of breath., Disp: , Rfl:  .  amiodarone (PACERONE) 100 MG tablet, Take 1 tablet (100 mg total) by mouth daily., Disp: 30 tablet, Rfl: 5 .  apixaban  (ELIQUIS) 5 MG TABS tablet, Take 1 tablet (5 mg total) by mouth 2 (two) times daily. (Patient taking differently: Take 5 mg by mouth 2 (two) times daily. ), Disp: 60 tablet, Rfl: 1 .  budesonide-formoterol (SYMBICORT) 160-4.5 MCG/ACT inhaler, Inhale 2 puffs into the lungs as needed (shortness of breath). , Disp: , Rfl:  .  Ensure Max Protein (ENSURE MAX PROTEIN) LIQD, Take 330 mLs (11 oz total) by mouth daily., Disp:  , Rfl:  .  famotidine (PEPCID) 20 MG tablet, Take 1 tablet (20 mg total) by mouth 2 (two) times daily., Disp: 60 tablet, Rfl: 2 .  FEROSUL 325 (65 Fe) MG tablet, Take 325 mg by mouth 2 (two) times daily., Disp: , Rfl:  .  fluticasone (FLONASE) 50 MCG/ACT nasal spray, Place 2 sprays into both nostrils daily as needed for allergies. , Disp: , Rfl:  .  gabapentin (NEURONTIN) 300 MG capsule, Take 1 capsule (300 mg total) by mouth 2 (two) times daily. (Patient taking differently: Take 300 mg by mouth 2 (two) times daily. ), Disp: 60 capsule, Rfl: 0 .  hydrocortisone (ANUSOL-HC) 2.5 % rectal cream, Place 1 application rectally 2 (two) times daily as needed for hemorrhoids or itching. , Disp: , Rfl:  .  hydrocortisone 1 % lotion, Apply 1 application topically See admin instructions. Apply small amount to back twice daily for itchy rash as needed, Disp: , Rfl:  .  insulin aspart (NOVOLOG) 100 UNIT/ML injection, Inject 4 Units into the skin 3 (three) times daily with meals., Disp: 10 mL, Rfl: 0 .  insulin aspart protamine - aspart (NOVOLOG MIX 70/30 FLEXPEN) (70-30) 100 UNIT/ML FlexPen, Inject 0.1 mLs (10 Units total) into the skin 2 (two) times daily., Disp: 15 mL, Rfl: 0 .  Insulin Syringe 27G X 1/2" 0.5 ML MISC, 100 Syringes by Does not apply route 3 (three) times daily., Disp: 100 each, Rfl: 1 .  ipratropium-albuterol (DUONEB) 0.5-2.5 (3) MG/3ML SOLN, Take 3 mLs by nebulization every 6 (six) hours as needed (shortness of breath/wheezing)., Disp: 360 mL, Rfl: 0 .  ipratropium-albuterol (DUONEB)  0.5-2.5 (3) MG/3ML SOLN, Take 3 mLs by nebulization every 6 (six) hours as needed., Disp: 360 mL, Rfl: 3 .  oxybutynin (DITROPAN) 5 MG tablet, Take 1 tablet (5 mg total) by mouth 4 (four) times daily., Disp: 120 tablet, Rfl: 0 .  oxyCODONE-acetaminophen (PERCOCET) 10-325 MG tablet, Take 1 tablet by mouth every 6 (six) hours as needed for pain. , Disp: , Rfl:  .  OXYGEN, Inhale 3 L into the lungs continuous. , Disp: , Rfl:  .  polyethylene glycol (MIRALAX / GLYCOLAX) 17 g packet, Take 17 g by mouth daily as needed., Disp: 14 each, Rfl: 0 .  potassium chloride SA (K-DUR) 20 MEQ tablet, Take 3 tablets (60 mEq total) by mouth 2 (two) times daily. With additional 2mq when you take Metolazone. (Patient taking differently: Take 60 mEq by mouth 2 (two) times daily. ), Disp: 90 tablet, Rfl: 3 .  PRESCRIPTION MEDICATION, Cpap, Disp: , Rfl:  .  rosuvastatin (CRESTOR) 20 MG tablet, Take 20 mg by mouth at bedtime., Disp: , Rfl:  .  senna-docusate (SENOKOT-S) 8.6-50 MG tablet, Take 1 tablet by mouth  at bedtime as needed for mild constipation., Disp: 30 tablet, Rfl: 0 .  sodium chloride (OCEAN) 0.65 % SOLN nasal spray, Place 2 sprays into both nostrils as needed for congestion. , Disp: , Rfl:  .  torsemide (DEMADEX) 20 MG tablet, Take 4 tablets (80 mg total) by mouth 2 (two) times daily., Disp: 150 tablet, Rfl: 3 .  pantoprazole (PROTONIX) 40 MG tablet, Take 1 tablet (40 mg total) by mouth 2 (two) times daily. (Patient not taking: Reported on 02/14/2020), Disp: 60 tablet, Rfl: 0 No Known Allergies    Social History   Socioeconomic History  . Marital status: Married    Spouse name: Not on file  . Number of children: Not on file  . Years of education: Not on file  . Highest education level: Not on file  Occupational History  . Not on file  Tobacco Use  . Smoking status: Former Smoker    Packs/day: 1.00    Years: 34.00    Pack years: 34.00    Types: Cigarettes    Quit date: 11/30/2018    Years since  quitting: 1.2  . Smokeless tobacco: Never Used  Substance and Sexual Activity  . Alcohol use: No    Alcohol/week: 0.0 standard drinks  . Drug use: No  . Sexual activity: Not on file  Other Topics Concern  . Not on file  Social History Narrative   ** Merged History Encounter **       Social Determinants of Health   Financial Resource Strain:   . Difficulty of Paying Living Expenses:   Food Insecurity:   . Worried About Charity fundraiser in the Last Year:   . Arboriculturist in the Last Year:   Transportation Needs:   . Film/video editor (Medical):   Marland Kitchen Lack of Transportation (Non-Medical):   Physical Activity:   . Days of Exercise per Week:   . Minutes of Exercise per Session:   Stress:   . Feeling of Stress :   Social Connections:   . Frequency of Communication with Friends and Family:   . Frequency of Social Gatherings with Friends and Family:   . Attends Religious Services:   . Active Member of Clubs or Organizations:   . Attends Archivist Meetings:   Marland Kitchen Marital Status:   Intimate Partner Violence:   . Fear of Current or Ex-Partner:   . Emotionally Abused:   Marland Kitchen Physically Abused:   . Sexually Abused:     Physical Exam      Future Appointments  Date Time Provider [place]  03/12/2020  3:00 PM [facility] PA/NP [facility] None    BP (!) 144/68   Pulse 78   Temp 98.2 F (36.8 C)   Resp 18   Wt 247 lb (112 kg)   SpO2 96%   BMI 33.50 kg/m   Weight yesterday-247 Last visit weight-245  Pt in bathroom when I arrived. Pt reports he has been constipated. Will use the sennakot this evening. He takes pain pills regularly. Pt states yesterday he had more sob--he has been straining to have BM as well. He also c/o semi-productive cough that's been going on for about a week. He states it is yellow in color. He reports felt like possible fever yesterday-hes worried it could be pneumonia again. I advised him he could go to urgent care for chest xray  and blood count. Lungs sounded clear today. No edema noted. No sob today. No fever today.  meds verified and pill box refilled through 5/12 --out of famotidine on 5/3--pts wife will order refill    Marylouise Stacks, [place] Paramedic  02/22/20

## 2020-02-26 DIAGNOSIS — J9621 Acute and chronic respiratory failure with hypoxia: Secondary | ICD-10-CM | POA: Diagnosis not present

## 2020-02-26 DIAGNOSIS — J441 Chronic obstructive pulmonary disease with (acute) exacerbation: Secondary | ICD-10-CM | POA: Diagnosis not present

## 2020-02-26 DIAGNOSIS — R269 Unspecified abnormalities of gait and mobility: Secondary | ICD-10-CM | POA: Diagnosis not present

## 2020-02-26 DIAGNOSIS — J449 Chronic obstructive pulmonary disease, unspecified: Secondary | ICD-10-CM | POA: Diagnosis not present

## 2020-02-28 DIAGNOSIS — E1122 Type 2 diabetes mellitus with diabetic chronic kidney disease: Secondary | ICD-10-CM | POA: Diagnosis not present

## 2020-02-28 DIAGNOSIS — I480 Paroxysmal atrial fibrillation: Secondary | ICD-10-CM | POA: Diagnosis not present

## 2020-02-28 DIAGNOSIS — I5043 Acute on chronic combined systolic (congestive) and diastolic (congestive) heart failure: Secondary | ICD-10-CM | POA: Diagnosis not present

## 2020-02-28 DIAGNOSIS — I130 Hypertensive heart and chronic kidney disease with heart failure and stage 1 through stage 4 chronic kidney disease, or unspecified chronic kidney disease: Secondary | ICD-10-CM | POA: Diagnosis not present

## 2020-02-28 DIAGNOSIS — E1142 Type 2 diabetes mellitus with diabetic polyneuropathy: Secondary | ICD-10-CM | POA: Diagnosis not present

## 2020-02-28 DIAGNOSIS — D631 Anemia in chronic kidney disease: Secondary | ICD-10-CM | POA: Diagnosis not present

## 2020-02-28 DIAGNOSIS — N184 Chronic kidney disease, stage 4 (severe): Secondary | ICD-10-CM | POA: Diagnosis not present

## 2020-02-28 DIAGNOSIS — I4892 Unspecified atrial flutter: Secondary | ICD-10-CM | POA: Diagnosis not present

## 2020-02-28 DIAGNOSIS — J439 Emphysema, unspecified: Secondary | ICD-10-CM | POA: Diagnosis not present

## 2020-03-01 ENCOUNTER — Other Ambulatory Visit: Payer: Self-pay | Admitting: Nephrology

## 2020-03-01 DIAGNOSIS — N179 Acute kidney failure, unspecified: Secondary | ICD-10-CM

## 2020-03-05 ENCOUNTER — Other Ambulatory Visit: Payer: No Typology Code available for payment source

## 2020-03-08 ENCOUNTER — Other Ambulatory Visit: Payer: No Typology Code available for payment source

## 2020-03-09 ENCOUNTER — Telehealth (HOSPITAL_COMMUNITY): Payer: Self-pay

## 2020-03-09 NOTE — Telephone Encounter (Signed)
Received call on provider line from Nurse of [facility].  She reports she was unable to see patient today and required an order to see patient next week. Order provided.

## 2020-03-12 ENCOUNTER — Telehealth (HOSPITAL_COMMUNITY): Payer: Self-pay | Admitting: *Deleted

## 2020-03-12 ENCOUNTER — Encounter (HOSPITAL_COMMUNITY): Payer: No Typology Code available for payment source

## 2020-03-12 NOTE — Telephone Encounter (Signed)
Myriam Jacobson with [facility] called to report a missed visit. Physical therapy has been unable to see patient due to his other appointments or him just not being home.

## 2020-03-13 DIAGNOSIS — N179 Acute kidney failure, unspecified: Secondary | ICD-10-CM | POA: Diagnosis not present

## 2020-03-13 DIAGNOSIS — I50810 Right heart failure, unspecified: Secondary | ICD-10-CM | POA: Diagnosis not present

## 2020-03-13 DIAGNOSIS — I2510 Atherosclerotic heart disease of native coronary artery without angina pectoris: Secondary | ICD-10-CM | POA: Diagnosis not present

## 2020-03-13 DIAGNOSIS — D509 Iron deficiency anemia, unspecified: Secondary | ICD-10-CM | POA: Diagnosis not present

## 2020-03-13 DIAGNOSIS — E1122 Type 2 diabetes mellitus with diabetic chronic kidney disease: Secondary | ICD-10-CM | POA: Diagnosis not present

## 2020-03-13 DIAGNOSIS — I129 Hypertensive chronic kidney disease with stage 1 through stage 4 chronic kidney disease, or unspecified chronic kidney disease: Secondary | ICD-10-CM | POA: Diagnosis not present

## 2020-03-13 DIAGNOSIS — N2581 Secondary hyperparathyroidism of renal origin: Secondary | ICD-10-CM | POA: Diagnosis not present

## 2020-03-13 DIAGNOSIS — I509 Heart failure, unspecified: Secondary | ICD-10-CM | POA: Diagnosis not present

## 2020-03-13 DIAGNOSIS — N184 Chronic kidney disease, stage 4 (severe): Secondary | ICD-10-CM | POA: Diagnosis not present

## 2020-03-14 ENCOUNTER — Telehealth (HOSPITAL_COMMUNITY): Payer: Self-pay

## 2020-03-14 ENCOUNTER — Other Ambulatory Visit (HOSPITAL_COMMUNITY): Payer: Self-pay

## 2020-03-14 DIAGNOSIS — E1122 Type 2 diabetes mellitus with diabetic chronic kidney disease: Secondary | ICD-10-CM | POA: Diagnosis not present

## 2020-03-14 DIAGNOSIS — E1142 Type 2 diabetes mellitus with diabetic polyneuropathy: Secondary | ICD-10-CM | POA: Diagnosis not present

## 2020-03-14 DIAGNOSIS — I130 Hypertensive heart and chronic kidney disease with heart failure and stage 1 through stage 4 chronic kidney disease, or unspecified chronic kidney disease: Secondary | ICD-10-CM | POA: Diagnosis not present

## 2020-03-14 DIAGNOSIS — D631 Anemia in chronic kidney disease: Secondary | ICD-10-CM | POA: Diagnosis not present

## 2020-03-14 DIAGNOSIS — I4892 Unspecified atrial flutter: Secondary | ICD-10-CM | POA: Diagnosis not present

## 2020-03-14 DIAGNOSIS — J439 Emphysema, unspecified: Secondary | ICD-10-CM | POA: Diagnosis not present

## 2020-03-14 DIAGNOSIS — N184 Chronic kidney disease, stage 4 (severe): Secondary | ICD-10-CM | POA: Diagnosis not present

## 2020-03-14 DIAGNOSIS — I5043 Acute on chronic combined systolic (congestive) and diastolic (congestive) heart failure: Secondary | ICD-10-CM | POA: Diagnosis not present

## 2020-03-14 DIAGNOSIS — I480 Paroxysmal atrial fibrillation: Secondary | ICD-10-CM | POA: Diagnosis not present

## 2020-03-14 NOTE — Telephone Encounter (Signed)
Lindsey,LPN from [facility] called to report that the patient has been experiencing a cough for about 2 weeks,weakness and has been constipated for the last 10 days despite taking Miralax and Senokot. I advised Mendel Ryder that patient needs to be seen at the ER due to coughing unsure if its heart failure or pneumonia(patient has a hx of it) also with him not being able to have a bowel movement he may need some type of imaging report to help r/u any possbile bowel obstructions. Mendel Ryder was apperceptive for the returned call and stated she would let the patient know.

## 2020-03-14 NOTE — Progress Notes (Signed)
Paramedicine Encounter    Patient ID: Nicholas Caldwell, male    DOB: 1946/09/09, 74 y.o.   MRN: [id number]   Patient Care Team: [name], MD as PCP - General (Internal Medicine) Jettie Booze, MD as Consulting Physician (Cardiology) Sharmon Revere as Physician Assistant (Cardiology) [name], MD as Attending Physician (Internal Medicine) Thompson Grayer, MD as Consulting Physician (Clinical Cardiac Electrophysiology) [name], MD (Internal Medicine)  Patient Active Problem List   Diagnosis Date Noted  . Acute respiratory failure with hypercapnia ([place])   . Encounter for intubation   . Pressure injury of skin 08/21/2019  . Cardiac arrest ([place]) 08/20/2019  . Palliative care by specialist   . DNR (do not resuscitate) discussion   . Fatigue   . Acute CHF (congestive heart failure) ([place]) 07/07/2019  . Acute on chronic heart failure ([place]) 06/09/2019  . Lobar pneumonia ([place]) 04/14/2019  . Hyperkalemia 04/10/2019  . Cerebral embolism with cerebral infarction 03/21/2019  . Gastritis and gastroduodenitis   . NSVT (nonsustained ventricular tachycardia) ([place]) 03/08/2019  . Tobacco abuse counseling 03/08/2019  . Iron deficiency anemia   . Acute on chronic systolic CHF (congestive heart failure) ([place]) 03/06/2019  . Diabetes mellitus type 2 in obese ([place])   . Primary osteoarthritis of right hip   . Hypomagnesemia   . Steroid-induced hyperglycemia   . Supplemental oxygen dependent   . Leukocytosis   . Hypokalemia   . Acute on chronic anemia   . Diabetic peripheral neuropathy ([place])   . Acute on chronic systolic (congestive) heart failure ([place])   . Acute on chronic respiratory failure ([place]) 01/14/2019  . Hypoxia   . Altered mental status   . Heart failure ([place]) 12/30/2018  . AKI (acute kidney injury) ([place])   . Acute respiratory failure ([place]) 11/22/2018  . Acute on chronic respiratory failure with hypoxia and hypercapnia ([place]) 11/13/2018  . Metabolic  encephalopathy 99/37/1696  . Acute respiratory failure with hypoxia and hypercapnia ([place]) 07/26/2018  . Acute exacerbation of CHF (congestive heart failure) ([place]) 07/26/2018  . CHF exacerbation ([place]) 05/01/2018  . CHF (congestive heart failure) ([place]) 04/30/2018  . Chronic respiratory failure with hypoxia ([place]) 12/28/2017  . Acute encephalopathy   . Atrial fibrillation with RVR ([place])   . SVT (supraventricular tachycardia) ([place])   . COPD GOLD 0   . Medically noncompliant   . Panlobular emphysema ([place])   . OSA (obstructive sleep apnea)   . Pulmonary hypertension ([place])   . Disorientation   . Pressure injury of skin 09/07/2016  . Acute on chronic combined systolic and diastolic CHF (congestive heart failure) ([place]) 09/05/2016  . Skin lesion-left heal 09/05/2016  . Chronic systolic CHF (congestive heart failure) ([place]) 07/16/2016  . COPD (chronic obstructive pulmonary disease) ([place]) 07/16/2016  . Uncontrolled type 2 diabetes mellitus with complication ([place])   . Diabetic polyneuropathy associated with diabetes mellitus due to underlying condition ([place])   . Normocytic anemia 06/29/2016  . CAD - Non-obstructive by LHC1/16 01/21/2016  . Prolonged QT interval 10/09/2015  . Nonischemic cardiomyopathy ([place]) 10/09/2015  . PAF (paroxysmal atrial fibrillation) ([place])   . Chronic pain 02/19/2015  . Morbid obesity ([place]) 02/13/2015  . Dyspnea   . Elevated troponin I level 11/01/2014  . COPD exacerbation ([place]) 11/01/2014  . Essential hypertension 10/31/2014  . Type 2 diabetes mellitus with neuropathy 10/31/2014  . Hyperlipidemia  10/31/2014  . Cigarette smoker 10/31/2014    Current Outpatient Medications:  .  albuterol (VENTOLIN HFA) 108 (90 Base)  MCG/ACT inhaler, Inhale 2 puffs into the lungs every 6 (six) hours as needed for wheezing or shortness of breath., Disp: , Rfl:  .  amiodarone (PACERONE) 100 MG tablet, Take 1 tablet (100 mg total) by mouth daily., Disp: 30 tablet, Rfl: 5 .  apixaban  (ELIQUIS) 5 MG TABS tablet, Take 1 tablet (5 mg total) by mouth 2 (two) times daily. (Patient taking differently: Take 5 mg by mouth 2 (two) times daily. ), Disp: 60 tablet, Rfl: 1 .  budesonide-formoterol (SYMBICORT) 160-4.5 MCG/ACT inhaler, Inhale 2 puffs into the lungs as needed (shortness of breath). , Disp: , Rfl:  .  Ensure Max Protein (ENSURE MAX PROTEIN) LIQD, Take 330 mLs (11 oz total) by mouth daily., Disp:  , Rfl:  .  famotidine (PEPCID) 20 MG tablet, Take 1 tablet (20 mg total) by mouth 2 (two) times daily., Disp: 60 tablet, Rfl: 2 .  FEROSUL 325 (65 Fe) MG tablet, Take 325 mg by mouth 2 (two) times daily., Disp: , Rfl:  .  fluticasone (FLONASE) 50 MCG/ACT nasal spray, Place 2 sprays into both nostrils daily as needed for allergies. , Disp: , Rfl:  .  gabapentin (NEURONTIN) 300 MG capsule, Take 1 capsule (300 mg total) by mouth 2 (two) times daily. (Patient taking differently: Take 300 mg by mouth 2 (two) times daily. ), Disp: 60 capsule, Rfl: 0 .  hydrocortisone (ANUSOL-HC) 2.5 % rectal cream, Place 1 application rectally 2 (two) times daily as needed for hemorrhoids or itching. , Disp: , Rfl:  .  hydrocortisone 1 % lotion, Apply 1 application topically See admin instructions. Apply small amount to back twice daily for itchy rash as needed, Disp: , Rfl:  .  insulin aspart (NOVOLOG) 100 UNIT/ML injection, Inject 4 Units into the skin 3 (three) times daily with meals., Disp: 10 mL, Rfl: 0 .  insulin aspart protamine - aspart (NOVOLOG MIX 70/30 FLEXPEN) (70-30) 100 UNIT/ML FlexPen, Inject 0.1 mLs (10 Units total) into the skin 2 (two) times daily., Disp: 15 mL, Rfl: 0 .  Insulin Syringe 27G X 1/2" 0.5 ML MISC, 100 Syringes by Does not apply route 3 (three) times daily., Disp: 100 each, Rfl: 1 .  ipratropium-albuterol (DUONEB) 0.5-2.5 (3) MG/3ML SOLN, Take 3 mLs by nebulization every 6 (six) hours as needed (shortness of breath/wheezing)., Disp: 360 mL, Rfl: 0 .  ipratropium-albuterol (DUONEB)  0.5-2.5 (3) MG/3ML SOLN, Take 3 mLs by nebulization every 6 (six) hours as needed., Disp: 360 mL, Rfl: 3 .  oxybutynin (DITROPAN) 5 MG tablet, Take 1 tablet (5 mg total) by mouth 4 (four) times daily., Disp: 120 tablet, Rfl: 0 .  oxyCODONE-acetaminophen (PERCOCET) 10-325 MG tablet, Take 1 tablet by mouth every 6 (six) hours as needed for pain. , Disp: , Rfl:  .  OXYGEN, Inhale 3 L into the lungs continuous. , Disp: , Rfl:  .  pantoprazole (PROTONIX) 40 MG tablet, Take 1 tablet (40 mg total) by mouth 2 (two) times daily. (Patient not taking: Reported on 02/14/2020), Disp: 60 tablet, Rfl: 0 .  polyethylene glycol (MIRALAX / GLYCOLAX) 17 g packet, Take 17 g by mouth daily as needed., Disp: 14 each, Rfl: 0 .  potassium chloride SA (K-DUR) 20 MEQ tablet, Take 3 tablets (60 mEq total) by mouth 2 (two) times daily. With additional 69mq when you take Metolazone. (Patient taking differently: Take 60 mEq by mouth 2 (two) times daily. ), Disp: 90 tablet, Rfl: 3 .  PRESCRIPTION MEDICATION, Cpap, Disp: , Rfl:  .  rosuvastatin (CRESTOR) 20 MG tablet, Take 20 mg by mouth at bedtime., Disp: , Rfl:  .  senna-docusate (SENOKOT-S) 8.6-50 MG tablet, Take 1 tablet by mouth at bedtime as needed for mild constipation., Disp: 30 tablet, Rfl: 0 .  sodium chloride (OCEAN) 0.65 % SOLN nasal spray, Place 2 sprays into both nostrils as needed for congestion. , Disp: , Rfl:  .  torsemide (DEMADEX) 20 MG tablet, Take 4 tablets (80 mg total) by mouth 2 (two) times daily., Disp: 150 tablet, Rfl: 3 No Known Allergies    Social History   Socioeconomic History  . Marital status: Married    Spouse name: Not on file  . Number of children: Not on file  . Years of education: Not on file  . Highest education level: Not on file  Occupational History  . Not on file  Tobacco Use  . Smoking status: Former Smoker    Packs/day: 1.00    Years: 34.00    Pack years: 34.00    Types: Cigarettes    Quit date: 11/30/2018    Years since  quitting: 1.2  . Smokeless tobacco: Never Used  Substance and Sexual Activity  . Alcohol use: No    Alcohol/week: 0.0 standard drinks  . Drug use: No  . Sexual activity: Not on file  Other Topics Concern  . Not on file  Social History Narrative   ** Merged History Encounter **       Social Determinants of Health   Financial Resource Strain:   . Difficulty of Paying Living Expenses:   Food Insecurity:   . Worried About Charity fundraiser in the Last Year:   . Arboriculturist in the Last Year:   Transportation Needs:   . Film/video editor (Medical):   Marland Kitchen Lack of Transportation (Non-Medical):   Physical Activity:   . Days of Exercise per Week:   . Minutes of Exercise per Session:   Stress:   . Feeling of Stress :   Social Connections:   . Frequency of Communication with Friends and Family:   . Frequency of Social Gatherings with Friends and Family:   . Attends Religious Services:   . Active Member of Clubs or Organizations:   . Attends Archivist Meetings:   Marland Kitchen Marital Status:   Intimate Partner Violence:   . Fear of Current or Ex-Partner:   . Emotionally Abused:   Marland Kitchen Physically Abused:   . Sexually Abused:     Physical Exam      Future Appointments  Date Time Provider [place]  03/22/2020  1:30 PM [facility] [place] [facility]  03/27/2020 12:00 PM [facility] PA/NP [facility] None  04/24/2020  4:30 PM Melvyn Novas, Christena Deem, MD [facility] None    BP 130/64   Pulse 76   Temp (!) 100.7 F (38.2 C)   Resp 20   Wt 250 lb (113.4 kg)   SpO2 99%   BMI 33.91 kg/m   Weight yesterday-247 Last visit weight-247  Pt reports he is not feeling well, he has had cough X 2wks with yellow phlegm at times, he feels weak, no B<M in 10days--he has tried sennakot and miralax. I advised him to go to ER for eval for chest x-ray and with no BM in 10 days to make sure he doesn't have an obstruction or something going on.  He also c/o this several wks ago as well but  never went to get f/u like I advised.  He said he was going to drink some apple juice and take some tylenol to see how he feels later. I offered to get EMS out here to take him to hosp but he does not want to go right now.  His weight is up 3lbs from yesterday--potentially from no BM in 10 days but of course cant 100% sure.  Pt denies c/p. No dizziness. Just feels weak. No increased sob unless exertion.  His nurse also came today and advised him of same thing.   meds verified and pill box refilled through next Thursday 5/20  Marylouise Stacks, [place] Paramedic  03/14/20

## 2020-03-14 NOTE — Telephone Encounter (Signed)
HH orders signed and faxed to [facility]

## 2020-03-15 ENCOUNTER — Telehealth (HOSPITAL_COMMUNITY): Payer: Self-pay | Admitting: Licensed Clinical Social Worker

## 2020-03-15 ENCOUNTER — Telehealth (HOSPITAL_COMMUNITY): Payer: Self-pay | Admitting: *Deleted

## 2020-03-15 DIAGNOSIS — I5022 Chronic systolic (congestive) heart failure: Secondary | ICD-10-CM

## 2020-03-15 NOTE — Telephone Encounter (Signed)
Per Nicholas Caldwell, CSW pt needs and would benefit from a toilet chair and just needs order sent into [facility] at [phone], order signed by Dr [name] and faxed

## 2020-03-15 NOTE — Telephone Encounter (Signed)
CSW informed by [employer] that pt is wanting a new toilet chair.  CSW spoke with triage who was able to send in referral to [facility] for a toilet chair  CSW will continue to follow and assist as needed  Jorge Ny, [place] Worker [place] Desk#: [phone] Cell#: [phone]

## 2020-03-16 ENCOUNTER — Telehealth (HOSPITAL_COMMUNITY): Payer: Self-pay | Admitting: *Deleted

## 2020-03-16 NOTE — Telephone Encounter (Signed)
Mallory with home health attempted to see pt again today for PT/OT and pt refused and rescheduled until next week. Next week will be the final attempt to see patient this will be the 4th or 5th missed visit.

## 2020-03-22 ENCOUNTER — Other Ambulatory Visit: Payer: No Typology Code available for payment source

## 2020-03-22 ENCOUNTER — Telehealth (HOSPITAL_COMMUNITY): Payer: Self-pay | Admitting: *Deleted

## 2020-03-22 ENCOUNTER — Telehealth (HOSPITAL_COMMUNITY): Payer: Self-pay

## 2020-03-22 NOTE — Telephone Encounter (Signed)
Nicholas Caldwell with [facility] called to report a missed visit with patient today. They were unable to get in touch with pt.   Call back # [phone]

## 2020-03-22 NOTE — Telephone Encounter (Signed)
Pts wife called me to reschedule our home visit appointment today. She reports she had to take car to get tire fixed and we resch for next Monday.   Marylouise Stacks, EMT-Paramedic  03/22/20

## 2020-03-23 ENCOUNTER — Telehealth (HOSPITAL_COMMUNITY): Payer: Self-pay | Admitting: *Deleted

## 2020-03-23 NOTE — Telephone Encounter (Signed)
Pt has refused OT eval x3, they will now close the case, advised ok as pt is non compliant

## 2020-03-26 ENCOUNTER — Other Ambulatory Visit: Payer: No Typology Code available for payment source

## 2020-03-26 ENCOUNTER — Other Ambulatory Visit (HOSPITAL_COMMUNITY): Payer: Self-pay

## 2020-03-26 NOTE — Progress Notes (Signed)
Paramedicine Encounter    Patient ID: Nicholas Caldwell, male    DOB: 1946/09/09, 74 y.o.   MRN: [id number]   Patient Care Team: [name], MD as PCP - General (Internal Medicine) Jettie Booze, MD as Consulting Physician (Cardiology) Sharmon Revere as Physician Assistant (Cardiology) [name], MD as Attending Physician (Internal Medicine) Thompson Grayer, MD as Consulting Physician (Clinical Cardiac Electrophysiology) [name], MD (Internal Medicine)  Patient Active Problem List   Diagnosis Date Noted  . Acute respiratory failure with hypercapnia ([place])   . Encounter for intubation   . Pressure injury of skin 08/21/2019  . Cardiac arrest ([place]) 08/20/2019  . Palliative care by specialist   . DNR (do not resuscitate) discussion   . Fatigue   . Acute CHF (congestive heart failure) ([place]) 07/07/2019  . Acute on chronic heart failure ([place]) 06/09/2019  . Lobar pneumonia ([place]) 04/14/2019  . Hyperkalemia 04/10/2019  . Cerebral embolism with cerebral infarction 03/21/2019  . Gastritis and gastroduodenitis   . NSVT (nonsustained ventricular tachycardia) ([place]) 03/08/2019  . Tobacco abuse counseling 03/08/2019  . Iron deficiency anemia   . Acute on chronic systolic CHF (congestive heart failure) ([place]) 03/06/2019  . Diabetes mellitus type 2 in obese ([place])   . Primary osteoarthritis of right hip   . Hypomagnesemia   . Steroid-induced hyperglycemia   . Supplemental oxygen dependent   . Leukocytosis   . Hypokalemia   . Acute on chronic anemia   . Diabetic peripheral neuropathy ([place])   . Acute on chronic systolic (congestive) heart failure ([place])   . Acute on chronic respiratory failure ([place]) 01/14/2019  . Hypoxia   . Altered mental status   . Heart failure ([place]) 12/30/2018  . AKI (acute kidney injury) ([place])   . Acute respiratory failure ([place]) 11/22/2018  . Acute on chronic respiratory failure with hypoxia and hypercapnia ([place]) 11/13/2018  . Metabolic  encephalopathy 99/37/1696  . Acute respiratory failure with hypoxia and hypercapnia ([place]) 07/26/2018  . Acute exacerbation of CHF (congestive heart failure) ([place]) 07/26/2018  . CHF exacerbation ([place]) 05/01/2018  . CHF (congestive heart failure) ([place]) 04/30/2018  . Chronic respiratory failure with hypoxia ([place]) 12/28/2017  . Acute encephalopathy   . Atrial fibrillation with RVR ([place])   . SVT (supraventricular tachycardia) ([place])   . COPD GOLD 0   . Medically noncompliant   . Panlobular emphysema ([place])   . OSA (obstructive sleep apnea)   . Pulmonary hypertension ([place])   . Disorientation   . Pressure injury of skin 09/07/2016  . Acute on chronic combined systolic and diastolic CHF (congestive heart failure) ([place]) 09/05/2016  . Skin lesion-left heal 09/05/2016  . Chronic systolic CHF (congestive heart failure) ([place]) 07/16/2016  . COPD (chronic obstructive pulmonary disease) ([place]) 07/16/2016  . Uncontrolled type 2 diabetes mellitus with complication ([place])   . Diabetic polyneuropathy associated with diabetes mellitus due to underlying condition ([place])   . Normocytic anemia 06/29/2016  . CAD - Non-obstructive by LHC1/16 01/21/2016  . Prolonged QT interval 10/09/2015  . Nonischemic cardiomyopathy ([place]) 10/09/2015  . PAF (paroxysmal atrial fibrillation) ([place])   . Chronic pain 02/19/2015  . Morbid obesity ([place]) 02/13/2015  . Dyspnea   . Elevated troponin I level 11/01/2014  . COPD exacerbation ([place]) 11/01/2014  . Essential hypertension 10/31/2014  . Type 2 diabetes mellitus with neuropathy 10/31/2014  . Hyperlipidemia  10/31/2014  . Cigarette smoker 10/31/2014    Current Outpatient Medications:  .  albuterol (VENTOLIN HFA) 108 (90 Base)  MCG/ACT inhaler, Inhale 2 puffs into the lungs every 6 (six) hours as needed for wheezing or shortness of breath., Disp: , Rfl:  .  amiodarone (PACERONE) 100 MG tablet, Take 1 tablet (100 mg total) by mouth daily., Disp: 30 tablet, Rfl: 5 .  apixaban  (ELIQUIS) 5 MG TABS tablet, Take 1 tablet (5 mg total) by mouth 2 (two) times daily. (Patient taking differently: Take 5 mg by mouth 2 (two) times daily. ), Disp: 60 tablet, Rfl: 1 .  budesonide-formoterol (SYMBICORT) 160-4.5 MCG/ACT inhaler, Inhale 2 puffs into the lungs as needed (shortness of breath). , Disp: , Rfl:  .  Ensure Max Protein (ENSURE MAX PROTEIN) LIQD, Take 330 mLs (11 oz total) by mouth daily., Disp:  , Rfl:  .  famotidine (PEPCID) 20 MG tablet, Take 1 tablet (20 mg total) by mouth 2 (two) times daily., Disp: 60 tablet, Rfl: 2 .  FEROSUL 325 (65 Fe) MG tablet, Take 325 mg by mouth 2 (two) times daily., Disp: , Rfl:  .  fluticasone (FLONASE) 50 MCG/ACT nasal spray, Place 2 sprays into both nostrils daily as needed for allergies. , Disp: , Rfl:  .  gabapentin (NEURONTIN) 300 MG capsule, Take 1 capsule (300 mg total) by mouth 2 (two) times daily. (Patient taking differently: Take 300 mg by mouth 2 (two) times daily. ), Disp: 60 capsule, Rfl: 0 .  hydrocortisone (ANUSOL-HC) 2.5 % rectal cream, Place 1 application rectally 2 (two) times daily as needed for hemorrhoids or itching. , Disp: , Rfl:  .  hydrocortisone 1 % lotion, Apply 1 application topically See admin instructions. Apply small amount to back twice daily for itchy rash as needed, Disp: , Rfl:  .  insulin aspart (NOVOLOG) 100 UNIT/ML injection, Inject 4 Units into the skin 3 (three) times daily with meals., Disp: 10 mL, Rfl: 0 .  insulin aspart protamine - aspart (NOVOLOG MIX 70/30 FLEXPEN) (70-30) 100 UNIT/ML FlexPen, Inject 0.1 mLs (10 Units total) into the skin 2 (two) times daily., Disp: 15 mL, Rfl: 0 .  Insulin Syringe 27G X 1/2" 0.5 ML MISC, 100 Syringes by Does not apply route 3 (three) times daily., Disp: 100 each, Rfl: 1 .  ipratropium-albuterol (DUONEB) 0.5-2.5 (3) MG/3ML SOLN, Take 3 mLs by nebulization every 6 (six) hours as needed (shortness of breath/wheezing)., Disp: 360 mL, Rfl: 0 .  ipratropium-albuterol (DUONEB)  0.5-2.5 (3) MG/3ML SOLN, Take 3 mLs by nebulization every 6 (six) hours as needed., Disp: 360 mL, Rfl: 3 .  oxybutynin (DITROPAN) 5 MG tablet, Take 1 tablet (5 mg total) by mouth 4 (four) times daily., Disp: 120 tablet, Rfl: 0 .  oxyCODONE-acetaminophen (PERCOCET) 10-325 MG tablet, Take 1 tablet by mouth every 6 (six) hours as needed for pain. , Disp: , Rfl:  .  OXYGEN, Inhale 3 L into the lungs continuous. , Disp: , Rfl:  .  pantoprazole (PROTONIX) 40 MG tablet, Take 1 tablet (40 mg total) by mouth 2 (two) times daily. (Patient not taking: Reported on 02/14/2020), Disp: 60 tablet, Rfl: 0 .  polyethylene glycol (MIRALAX / GLYCOLAX) 17 g packet, Take 17 g by mouth daily as needed., Disp: 14 each, Rfl: 0 .  potassium chloride SA (K-DUR) 20 MEQ tablet, Take 3 tablets (60 mEq total) by mouth 2 (two) times daily. With additional 65mq when you take Metolazone. (Patient taking differently: Take 60 mEq by mouth 2 (two) times daily. ), Disp: 90 tablet, Rfl: 3 .  PRESCRIPTION MEDICATION, Cpap, Disp: , Rfl:  .  rosuvastatin (CRESTOR) 20 MG tablet, Take 20 mg by mouth at bedtime., Disp: , Rfl:  .  senna-docusate (SENOKOT-S) 8.6-50 MG tablet, Take 1 tablet by mouth at bedtime as needed for mild constipation., Disp: 30 tablet, Rfl: 0 .  sodium chloride (OCEAN) 0.65 % SOLN nasal spray, Place 2 sprays into both nostrils as needed for congestion. , Disp: , Rfl:  .  torsemide (DEMADEX) 20 MG tablet, Take 4 tablets (80 mg total) by mouth 2 (two) times daily., Disp: 150 tablet, Rfl: 3 No Known Allergies    Social History   Socioeconomic History  . Marital status: Married    Spouse name: Not on file  . Number of children: Not on file  . Years of education: Not on file  . Highest education level: Not on file  Occupational History  . Not on file  Tobacco Use  . Smoking status: Former Smoker    Packs/day: 1.00    Years: 34.00    Pack years: 34.00    Types: Cigarettes    Quit date: 11/30/2018    Years since  quitting: 1.3  . Smokeless tobacco: Never Used  Substance and Sexual Activity  . Alcohol use: No    Alcohol/week: 0.0 standard drinks  . Drug use: No  . Sexual activity: Not on file  Other Topics Concern  . Not on file  Social History Narrative   ** Merged History Encounter **       Social Determinants of Health   Financial Resource Strain:   . Difficulty of Paying Living Expenses:   Food Insecurity:   . Worried About Charity fundraiser in the Last Year:   . Arboriculturist in the Last Year:   Transportation Needs:   . Film/video editor (Medical):   Marland Kitchen Lack of Transportation (Non-Medical):   Physical Activity:   . Days of Exercise per Week:   . Minutes of Exercise per Session:   Stress:   . Feeling of Stress :   Social Connections:   . Frequency of Communication with Friends and Family:   . Frequency of Social Gatherings with Friends and Family:   . Attends Religious Services:   . Active Member of Clubs or Organizations:   . Attends Archivist Meetings:   Marland Kitchen Marital Status:   Intimate Partner Violence:   . Fear of Current or Ex-Partner:   . Emotionally Abused:   Marland Kitchen Physically Abused:   . Sexually Abused:     Physical Exam      Future Appointments  Date Time Provider [place]  03/27/2020 12:00 PM [facility] PA/NP [facility] None  04/12/2020  1:30 PM Noralyn Pick, NP [facility]  04/13/2020  4:15 PM [facility] [place] [facility]  04/24/2020  4:30 PM Wert, Christena Deem, MD [facility] None   Pt was asleep when I arrived, wife reports he has been feeling good. He did not make it to GI appointment today due to wife not feeling good enough to take him.  She reports he has had good BM using that stool softener.  He has clinic appointment tomor and wife states they will be there tomor.  I did fill his pill box for approx 2 wks.  He is out of the famotidine--wife will see if [facility] has it on file --she called them and they will  refill.    Marylouise Stacks, [facility] Paramedic  03/26/20

## 2020-03-27 ENCOUNTER — Telehealth (HOSPITAL_COMMUNITY): Payer: Self-pay

## 2020-03-27 ENCOUNTER — Encounter (HOSPITAL_COMMUNITY): Payer: No Typology Code available for payment source

## 2020-03-27 DIAGNOSIS — J449 Chronic obstructive pulmonary disease, unspecified: Secondary | ICD-10-CM | POA: Diagnosis not present

## 2020-03-27 DIAGNOSIS — J9621 Acute and chronic respiratory failure with hypoxia: Secondary | ICD-10-CM | POA: Diagnosis not present

## 2020-03-27 DIAGNOSIS — R269 Unspecified abnormalities of gait and mobility: Secondary | ICD-10-CM | POA: Diagnosis not present

## 2020-03-27 DIAGNOSIS — J441 Chronic obstructive pulmonary disease with (acute) exacerbation: Secondary | ICD-10-CM | POA: Diagnosis not present

## 2020-03-27 NOTE — Telephone Encounter (Signed)
HH orders signed and faxed to [facility]

## 2020-03-30 IMAGING — DX DG CHEST 1V PORT
1 series · 1 of 1 positions shown · non-contrast
Comparison: 07/28/2018

CLINICAL DATA: Shortness of breath. Cough. Wheezing. Hypoxia.
Congestive heart failure.

EXAM:
PORTABLE CHEST 1 VIEW

[chest ap]
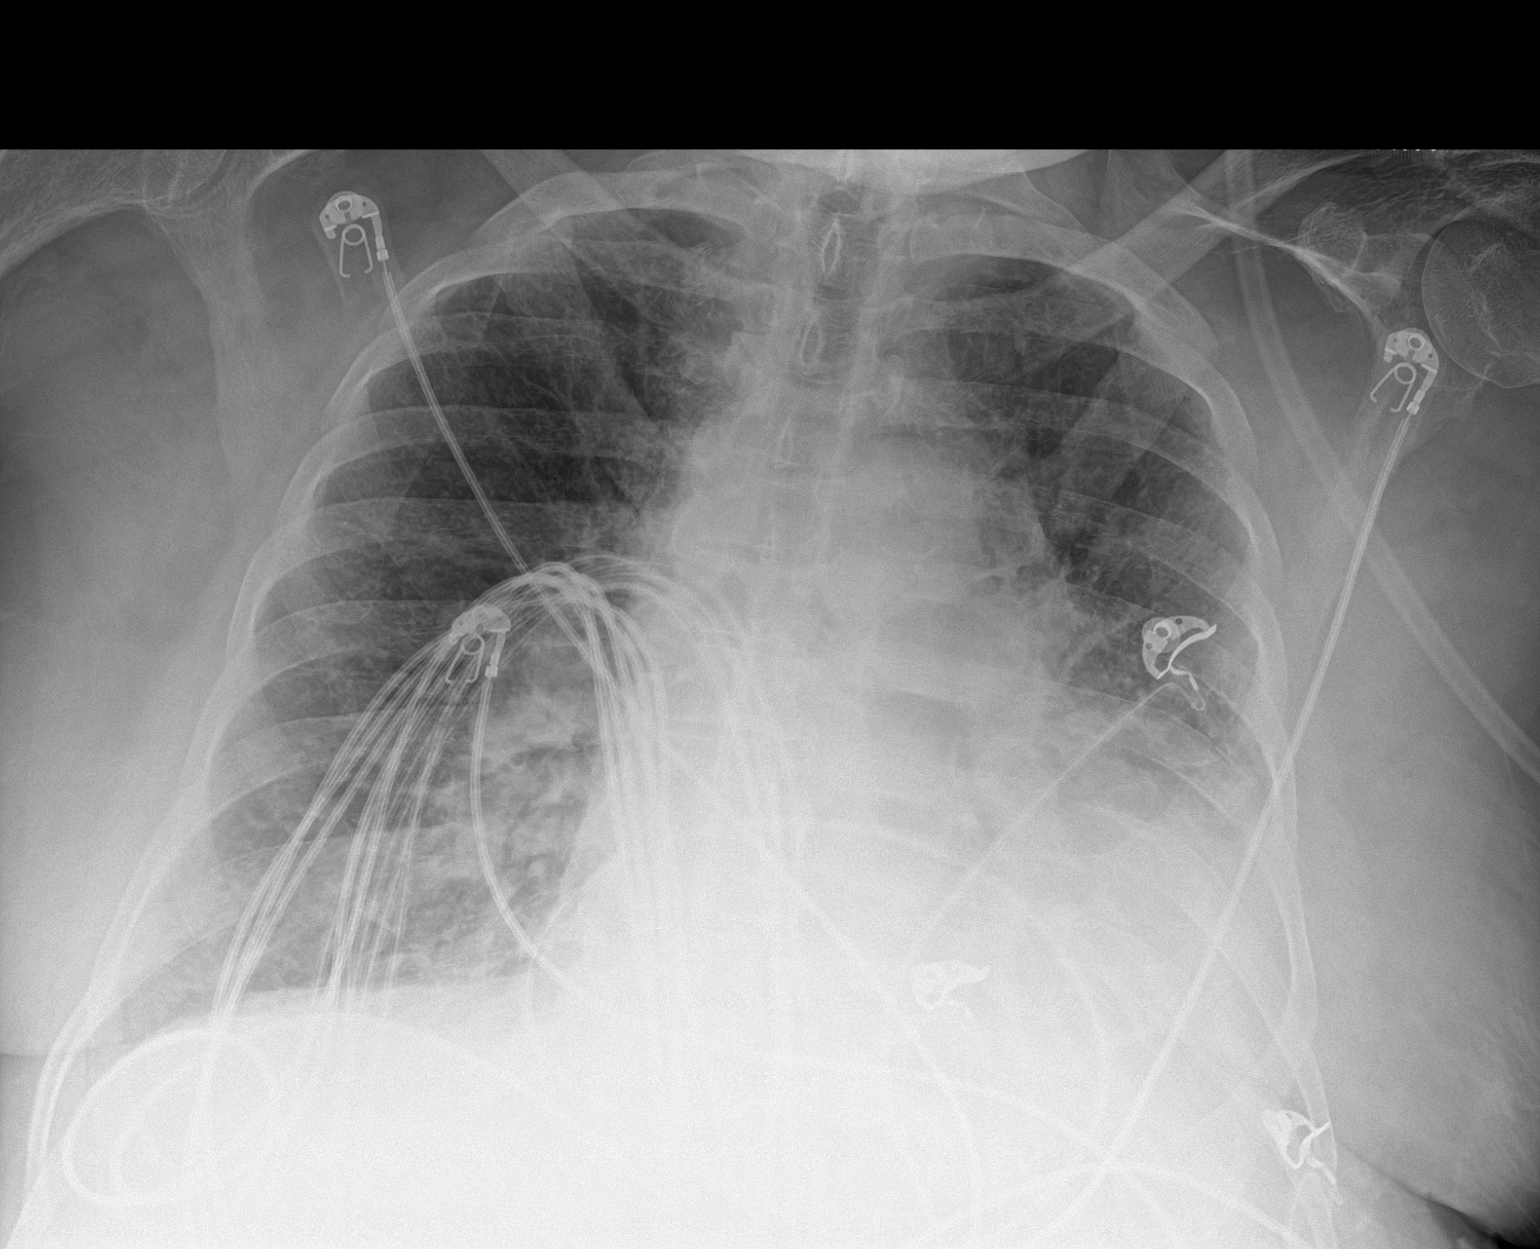

[1 of 1 positions shown; findings below may reference images not displayed]

FINDINGS: Stable cardiomegaly. Pulmonary opacity is again seen in the left
lower lung, without significant change. Right lung remains clear.
IMPRESSION: Stable pulmonary opacity in left lower lung.

Stable cardiomegaly.

## 2020-04-10 ENCOUNTER — Telehealth (HOSPITAL_COMMUNITY): Payer: Self-pay

## 2020-04-10 NOTE — Telephone Encounter (Signed)
pts wife called me to cancel our appointment this afternoon.  This has happened several times now.  We resch for next Monday-that was the next afternoon I had available.   Marylouise Stacks, EMT-paramedic  04/10/20

## 2020-04-11 DIAGNOSIS — I2510 Atherosclerotic heart disease of native coronary artery without angina pectoris: Secondary | ICD-10-CM | POA: Diagnosis not present

## 2020-04-11 DIAGNOSIS — I50810 Right heart failure, unspecified: Secondary | ICD-10-CM | POA: Diagnosis not present

## 2020-04-11 DIAGNOSIS — N179 Acute kidney failure, unspecified: Secondary | ICD-10-CM | POA: Diagnosis not present

## 2020-04-11 DIAGNOSIS — E1122 Type 2 diabetes mellitus with diabetic chronic kidney disease: Secondary | ICD-10-CM | POA: Diagnosis not present

## 2020-04-11 DIAGNOSIS — N2581 Secondary hyperparathyroidism of renal origin: Secondary | ICD-10-CM | POA: Diagnosis not present

## 2020-04-11 DIAGNOSIS — D509 Iron deficiency anemia, unspecified: Secondary | ICD-10-CM | POA: Diagnosis not present

## 2020-04-11 DIAGNOSIS — I509 Heart failure, unspecified: Secondary | ICD-10-CM | POA: Diagnosis not present

## 2020-04-11 DIAGNOSIS — N184 Chronic kidney disease, stage 4 (severe): Secondary | ICD-10-CM | POA: Diagnosis not present

## 2020-04-11 DIAGNOSIS — I129 Hypertensive chronic kidney disease with stage 1 through stage 4 chronic kidney disease, or unspecified chronic kidney disease: Secondary | ICD-10-CM | POA: Diagnosis not present

## 2020-04-12 ENCOUNTER — Ambulatory Visit: Payer: Medicare HMO | Admitting: Nurse Practitioner

## 2020-04-12 NOTE — Progress Notes (Deleted)
04/12/2020 Nicholas Caldwell [id number] Jul 29, 1946   Chief Complaint:  History of Present Illness: Nicholas Caldwell is a 74 year old male with a past medical history of hypertension, coronary artery disease, paroxysmal atrial fibrillation, nonischemic cardiomyopathy, COPD, diabetes mellitus type 2 and chronic pain.  He presented to the hospital 08/20/2019 status post PEA cardiac arrest.  During his hospitalization, he was evaluated by Dr. Rush Landmark and Ellouise Newer, PA-C for an normocytic anemia with a positive FOBT with out overt GI bleeding.  Patient did not wish to pursue any endoscopic evaluation during his hospital admission and he considered following up at the [facility] hospital.  Small bowel enteroscopy 03/15/2019 by Dr. Rush Landmark: - No gross lesions in esophagus. - Congested, erythematous, friable (with contact bleeding), granular and inflamed mucosa in the cardia and gastric body. Biopsied. - Non-bleeding erosive gastropathy. Biopsied for HP. - Non-bleeding gastric ulcer with a clean ulcer base (Forrest Class III). - Normal mucosa was found in the entire examined duodenum. - Normal mucosa was found in the proximal jejunum. Tattooed distal extent.  1. Stomach, biopsy, body - GASTRIC OXYNTIC MUCOSA WITH RARE CRYSTALLINE IRON DEPOSITS, SUGGESTIVE OF IRON PILL GASTROPATHY - NEGATIVE FOR INTESTINAL METAPLASIA, DYSPLASIA OR MALIGNANCY 2. Stomach, biopsy, antrum - GASTRIC ANTRAL MUCOSA WITH FEW CRYSTALLINE IRON DEPOSITS, SUGGESTIVE OF IRON PILL GASTROPATHY - FOCAL INTESTINAL METAPLASIA, NEGATIVE FOR DYSPLASIA - WARTHIN-STARRY STAIN IS NEGATIVE FOR HELICOBACTER PYLORI  - Follow up EGD to be considered as outpatient by primary GI Cirigliano once he is ready for discharge - query 8-10 weeks. - Maintain PO PPI BID 40 mg x 12-weeks at minimum. - Minimize NSAID use as able.  EGD 03/11/2019 by Dr. Bryan Lemma at [facility]: - Normal esophagus. - Erythematous, edematous, and necrotic appearing mucosa  in the gastric fundus and gastric body. The endoscopic appearance is worrisome for underlying malignant etiology. This was extensively biopsied. - Gastritis. - Normal duodenal bulb, first portion of the duodenum and second portion of the Duodenum. Stomach, biopsy - GASTRIC OXYNTIC MUCOSA SHOWING REACTIVE GASTROPATHY WITH SUBEPITHELIAL CRYSTALLINE IRON DEPOSITS, CONSISTENT WITH IRON PILL GASTROPATHY - WARTHIN-STARRY STAIN IS NEGATIVE FOR HELICOBACTER PYLORI  Colonoscopy 03/11/2019: - Preparation of the colon was poor. - Diverticulosis in the ascending colon. - Stool in the entire examined colon. - The visualized mucosa was otherwise normal appearing throughout the colon. There were no areas of bleeding or stigmata of recent bleeding noted. - Non-bleeding internal hemorrhoids. - No specimens collected.   Past Medical History:  Diagnosis Date   Atrial flutter ([place])    a. recurrent AFlutter with RVR;  b. Amiodarone Rx started 4/16   CAD (coronary artery disease)    a. LHC 1/16:  mLAD diffuse disease, pLCx mild disease, dLCx with disease but too small for PCI, RCA ok, EF 25-30%   Chronic pain    Chronic systolic CHF (congestive heart failure) (HCC)    COPD (chronic obstructive pulmonary disease) ([place])    Diabetes mellitus without complication ([place])    Ex-smoker 11/2017   Hypercholesteremia    Hypertension    NICM (nonischemic cardiomyopathy) ([place])    a.dx 2016. b. 2D echo 06/2016 - Last echo 07/01/16: mod dilated LV, mod LVH, EF 25-30%, mild-mod MR, sev LAE, mild-mod reduced RV systolic function, mild-mod TR, PASP 78mHG.   PAF (paroxysmal atrial fibrillation) (HCC)    On amio - ot a candidate for flecainide due to cardiomyopathy, not a candidate for Tikosyn due to prolonged QT, and felt to be a poor candidate for ablation given  left atrial size.   Pulmonary hypertension (HCC)    Tobacco abuse       Current Medications, Allergies, Past Medical History, Past Surgical  History, Family History and Social History were reviewed in [employer] record.   Physical Exam: There were no vitals taken for this visit. General: Well developed, w   ***male in no acute distress. Head: Normocephalic and atraumatic. Eyes: No scleral icterus. Conjunctiva pink . Ears: Normal auditory acuity. Mouth: Dentition intact. No ulcers or lesions.  Lungs: Clear throughout to auscultation. Heart: Regular rate and rhythm, no murmur. Abdomen: Soft, nontender and nondistended. No masses or hepatomegaly. Normal bowel sounds x 4 quadrants.  Rectal: *** Musculoskeletal: Symmetrical with no gross deformities. Extremities: No edema. Neurological: Alert oriented x 4. No focal deficits.  Psychological: Alert and cooperative. Normal mood and affect  Assessment and Recommendations: ***

## 2020-04-13 ENCOUNTER — Other Ambulatory Visit: Payer: No Typology Code available for payment source

## 2020-04-16 ENCOUNTER — Other Ambulatory Visit (HOSPITAL_COMMUNITY): Payer: Self-pay

## 2020-04-16 IMAGING — DX DG CHEST 1V PORT
1 series · 1 of 1 positions shown · non-contrast
Comparison: 10/26/2018

CLINICAL DATA: Shortness of breath

EXAM:
PORTABLE CHEST 1 VIEW

[chest]
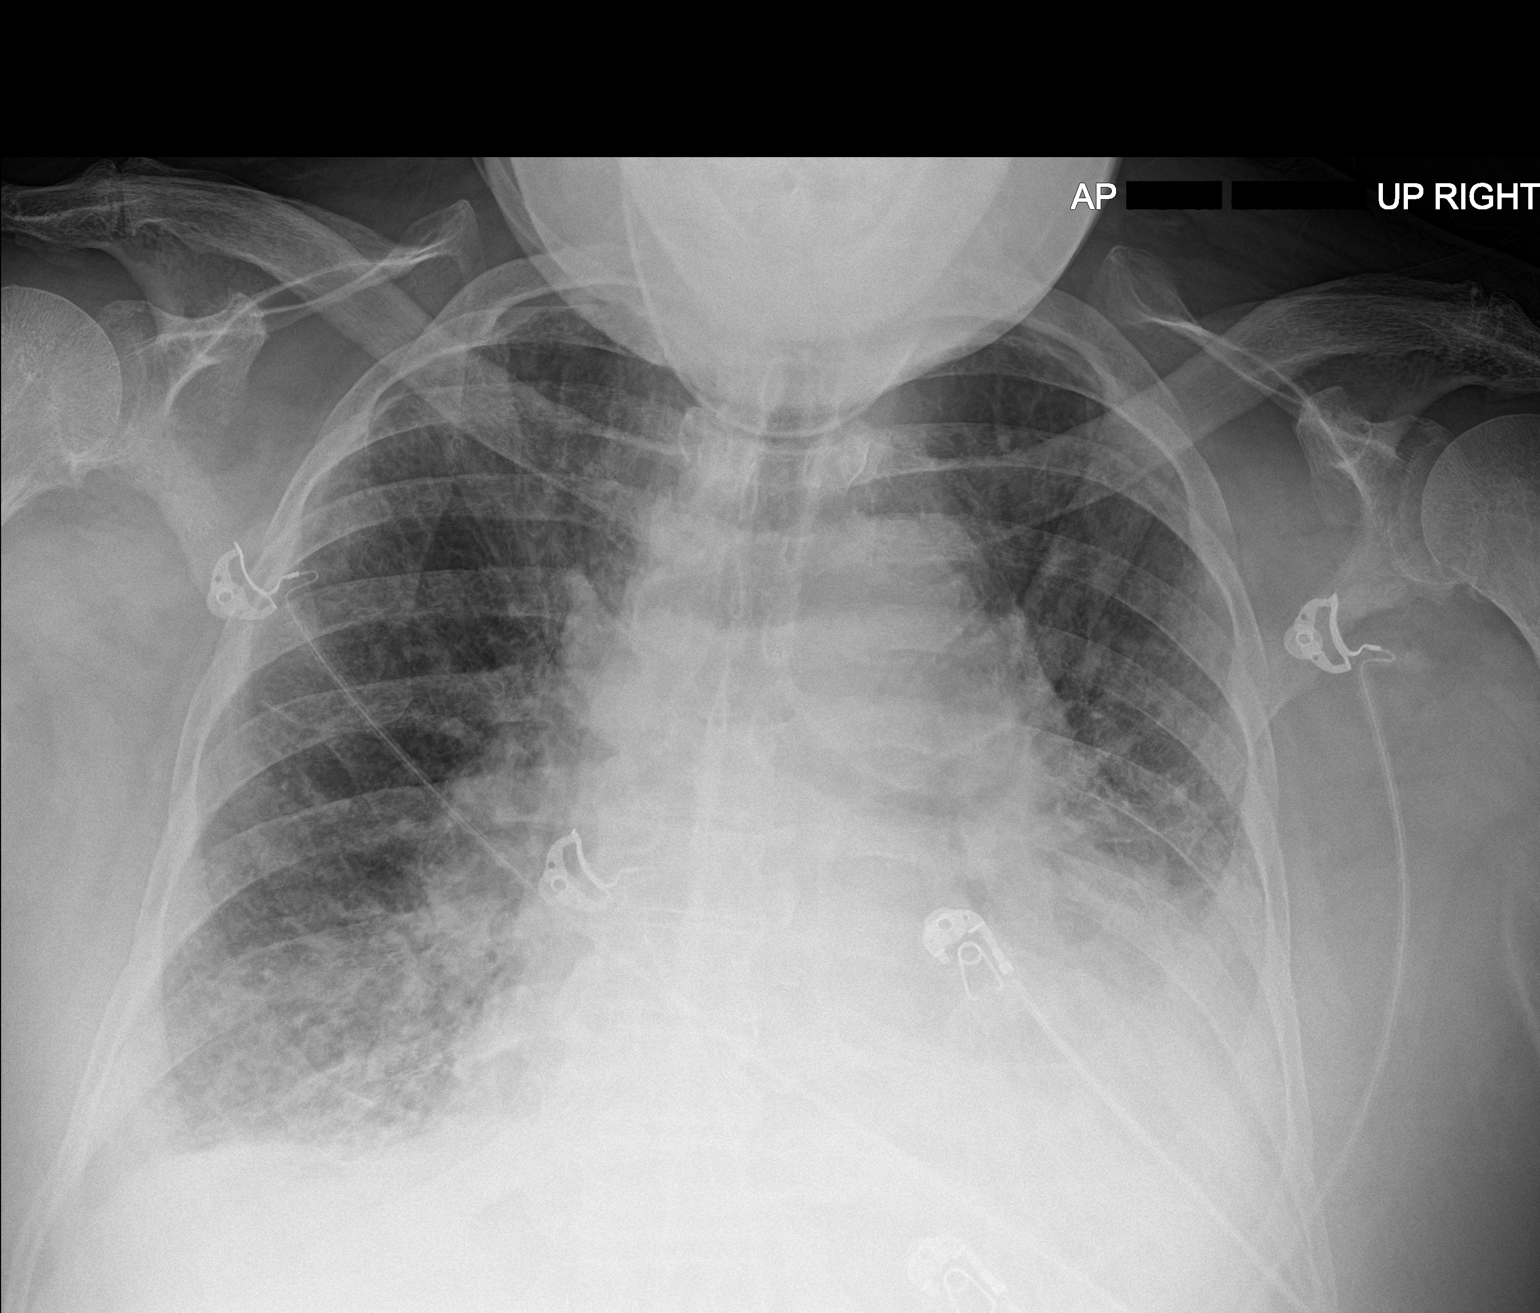

[1 of 1 positions shown; findings below may reference images not displayed]

FINDINGS: Cardiomegaly with vascular congestion and moderate pulmonary edema.
There are small pleural effusions. Aortic atherosclerosis. No
pneumothorax. Persistent left lung base opacity.
IMPRESSION: Cardiomegaly with vascular congestion and moderate diffuse
interstitial opacities suspicious for pulmonary edema. More
confluent hazy lung base opacities may reflect atelectasis or
superimposed pneumonia. There are small pleural effusions.

## 2020-04-16 NOTE — Progress Notes (Signed)
Paramedicine Encounter    Patient ID: Nicholas Caldwell, male    DOB: 1946/09/09, 74 y.o.   MRN: [id number]   Patient Care Team: [name], MD as PCP - General (Internal Medicine) Jettie Booze, MD as Consulting Physician (Cardiology) Sharmon Revere as Physician Assistant (Cardiology) [name], MD as Attending Physician (Internal Medicine) Thompson Grayer, MD as Consulting Physician (Clinical Cardiac Electrophysiology) [name], MD (Internal Medicine)  Patient Active Problem List   Diagnosis Date Noted  . Acute respiratory failure with hypercapnia ([place])   . Encounter for intubation   . Pressure injury of skin 08/21/2019  . Cardiac arrest ([place]) 08/20/2019  . Palliative care by specialist   . DNR (do not resuscitate) discussion   . Fatigue   . Acute CHF (congestive heart failure) ([place]) 07/07/2019  . Acute on chronic heart failure ([place]) 06/09/2019  . Lobar pneumonia ([place]) 04/14/2019  . Hyperkalemia 04/10/2019  . Cerebral embolism with cerebral infarction 03/21/2019  . Gastritis and gastroduodenitis   . NSVT (nonsustained ventricular tachycardia) ([place]) 03/08/2019  . Tobacco abuse counseling 03/08/2019  . Iron deficiency anemia   . Acute on chronic systolic CHF (congestive heart failure) ([place]) 03/06/2019  . Diabetes mellitus type 2 in obese ([place])   . Primary osteoarthritis of right hip   . Hypomagnesemia   . Steroid-induced hyperglycemia   . Supplemental oxygen dependent   . Leukocytosis   . Hypokalemia   . Acute on chronic anemia   . Diabetic peripheral neuropathy ([place])   . Acute on chronic systolic (congestive) heart failure ([place])   . Acute on chronic respiratory failure ([place]) 01/14/2019  . Hypoxia   . Altered mental status   . Heart failure ([place]) 12/30/2018  . AKI (acute kidney injury) ([place])   . Acute respiratory failure ([place]) 11/22/2018  . Acute on chronic respiratory failure with hypoxia and hypercapnia ([place]) 11/13/2018  . Metabolic  encephalopathy 99/37/1696  . Acute respiratory failure with hypoxia and hypercapnia ([place]) 07/26/2018  . Acute exacerbation of CHF (congestive heart failure) ([place]) 07/26/2018  . CHF exacerbation ([place]) 05/01/2018  . CHF (congestive heart failure) ([place]) 04/30/2018  . Chronic respiratory failure with hypoxia ([place]) 12/28/2017  . Acute encephalopathy   . Atrial fibrillation with RVR ([place])   . SVT (supraventricular tachycardia) ([place])   . COPD GOLD 0   . Medically noncompliant   . Panlobular emphysema ([place])   . OSA (obstructive sleep apnea)   . Pulmonary hypertension ([place])   . Disorientation   . Pressure injury of skin 09/07/2016  . Acute on chronic combined systolic and diastolic CHF (congestive heart failure) ([place]) 09/05/2016  . Skin lesion-left heal 09/05/2016  . Chronic systolic CHF (congestive heart failure) ([place]) 07/16/2016  . COPD (chronic obstructive pulmonary disease) ([place]) 07/16/2016  . Uncontrolled type 2 diabetes mellitus with complication ([place])   . Diabetic polyneuropathy associated with diabetes mellitus due to underlying condition ([place])   . Normocytic anemia 06/29/2016  . CAD - Non-obstructive by LHC1/16 01/21/2016  . Prolonged QT interval 10/09/2015  . Nonischemic cardiomyopathy ([place]) 10/09/2015  . PAF (paroxysmal atrial fibrillation) ([place])   . Chronic pain 02/19/2015  . Morbid obesity ([place]) 02/13/2015  . Dyspnea   . Elevated troponin I level 11/01/2014  . COPD exacerbation ([place]) 11/01/2014  . Essential hypertension 10/31/2014  . Type 2 diabetes mellitus with neuropathy 10/31/2014  . Hyperlipidemia  10/31/2014  . Cigarette smoker 10/31/2014    Current Outpatient Medications:  .  albuterol (VENTOLIN HFA) 108 (90 Base)  MCG/ACT inhaler, Inhale 2 puffs into the lungs every 6 (six) hours as needed for wheezing or shortness of breath., Disp: , Rfl:  .  amiodarone (PACERONE) 100 MG tablet, Take 1 tablet (100 mg total) by mouth daily., Disp: 30 tablet, Rfl: 5 .  apixaban  (ELIQUIS) 5 MG TABS tablet, Take 1 tablet (5 mg total) by mouth 2 (two) times daily. (Patient taking differently: Take 5 mg by mouth 2 (two) times daily. ), Disp: 60 tablet, Rfl: 1 .  budesonide-formoterol (SYMBICORT) 160-4.5 MCG/ACT inhaler, Inhale 2 puffs into the lungs as needed (shortness of breath). , Disp: , Rfl:  .  Ensure Max Protein (ENSURE MAX PROTEIN) LIQD, Take 330 mLs (11 oz total) by mouth daily., Disp:  , Rfl:  .  famotidine (PEPCID) 20 MG tablet, Take 1 tablet (20 mg total) by mouth 2 (two) times daily., Disp: 60 tablet, Rfl: 2 .  FEROSUL 325 (65 Fe) MG tablet, Take 325 mg by mouth 2 (two) times daily., Disp: , Rfl:  .  fluticasone (FLONASE) 50 MCG/ACT nasal spray, Place 2 sprays into both nostrils daily as needed for allergies. , Disp: , Rfl:  .  gabapentin (NEURONTIN) 300 MG capsule, Take 1 capsule (300 mg total) by mouth 2 (two) times daily. (Patient taking differently: Take 300 mg by mouth 2 (two) times daily. ), Disp: 60 capsule, Rfl: 0 .  hydrocortisone (ANUSOL-HC) 2.5 % rectal cream, Place 1 application rectally 2 (two) times daily as needed for hemorrhoids or itching. , Disp: , Rfl:  .  hydrocortisone 1 % lotion, Apply 1 application topically See admin instructions. Apply small amount to back twice daily for itchy rash as needed, Disp: , Rfl:  .  insulin aspart (NOVOLOG) 100 UNIT/ML injection, Inject 4 Units into the skin 3 (three) times daily with meals., Disp: 10 mL, Rfl: 0 .  insulin aspart protamine - aspart (NOVOLOG MIX 70/30 FLEXPEN) (70-30) 100 UNIT/ML FlexPen, Inject 0.1 mLs (10 Units total) into the skin 2 (two) times daily., Disp: 15 mL, Rfl: 0 .  Insulin Syringe 27G X 1/2" 0.5 ML MISC, 100 Syringes by Does not apply route 3 (three) times daily., Disp: 100 each, Rfl: 1 .  ipratropium-albuterol (DUONEB) 0.5-2.5 (3) MG/3ML SOLN, Take 3 mLs by nebulization every 6 (six) hours as needed (shortness of breath/wheezing)., Disp: 360 mL, Rfl: 0 .  ipratropium-albuterol (DUONEB)  0.5-2.5 (3) MG/3ML SOLN, Take 3 mLs by nebulization every 6 (six) hours as needed., Disp: 360 mL, Rfl: 3 .  oxybutynin (DITROPAN) 5 MG tablet, Take 1 tablet (5 mg total) by mouth 4 (four) times daily., Disp: 120 tablet, Rfl: 0 .  oxyCODONE-acetaminophen (PERCOCET) 10-325 MG tablet, Take 1 tablet by mouth every 6 (six) hours as needed for pain. , Disp: , Rfl:  .  OXYGEN, Inhale 3 L into the lungs continuous. , Disp: , Rfl:  .  pantoprazole (PROTONIX) 40 MG tablet, Take 1 tablet (40 mg total) by mouth 2 (two) times daily. (Patient not taking: Reported on 02/14/2020), Disp: 60 tablet, Rfl: 0 .  polyethylene glycol (MIRALAX / GLYCOLAX) 17 g packet, Take 17 g by mouth daily as needed., Disp: 14 each, Rfl: 0 .  potassium chloride SA (K-DUR) 20 MEQ tablet, Take 3 tablets (60 mEq total) by mouth 2 (two) times daily. With additional 65mq when you take Metolazone. (Patient taking differently: Take 60 mEq by mouth 2 (two) times daily. ), Disp: 90 tablet, Rfl: 3 .  PRESCRIPTION MEDICATION, Cpap, Disp: , Rfl:  .  rosuvastatin (CRESTOR) 20 MG tablet, Take 20 mg by mouth at bedtime., Disp: , Rfl:  .  senna-docusate (SENOKOT-S) 8.6-50 MG tablet, Take 1 tablet by mouth at bedtime as needed for mild constipation., Disp: 30 tablet, Rfl: 0 .  sodium chloride (OCEAN) 0.65 % SOLN nasal spray, Place 2 sprays into both nostrils as needed for congestion. , Disp: , Rfl:  .  torsemide (DEMADEX) 20 MG tablet, Take 4 tablets (80 mg total) by mouth 2 (two) times daily., Disp: 150 tablet, Rfl: 3 No Known Allergies    Social History   Socioeconomic History  . Marital status: Married    Spouse name: Not on file  . Number of children: Not on file  . Years of education: Not on file  . Highest education level: Not on file  Occupational History  . Not on file  Tobacco Use  . Smoking status: Former Smoker    Packs/day: 1.00    Years: 34.00    Pack years: 34.00    Types: Cigarettes    Quit date: 11/30/2018    Years since  quitting: 1.3  . Smokeless tobacco: Never Used  Vaping Use  . Vaping Use: Never used  Substance and Sexual Activity  . Alcohol use: No    Alcohol/week: 0.0 standard drinks  . Drug use: No  . Sexual activity: Not on file  Other Topics Concern  . Not on file  Social History Narrative   ** Merged History Encounter **       Social Determinants of Health   Financial Resource Strain:   . Difficulty of Paying Living Expenses:   Food Insecurity:   . Worried About Charity fundraiser in the Last Year:   . Arboriculturist in the Last Year:   Transportation Needs:   . Film/video editor (Medical):   Marland Kitchen Lack of Transportation (Non-Medical):   Physical Activity:   . Days of Exercise per Week:   . Minutes of Exercise per Session:   Stress:   . Feeling of Stress :   Social Connections:   . Frequency of Communication with Friends and Family:   . Frequency of Social Gatherings with Friends and Family:   . Attends Religious Services:   . Active Member of Clubs or Organizations:   . Attends Archivist Meetings:   Marland Kitchen Marital Status:   Intimate Partner Violence:   . Fear of Current or Ex-Partner:   . Emotionally Abused:   Marland Kitchen Physically Abused:   . Sexually Abused:     Physical Exam      Future Appointments  Date Time Provider [place]  04/24/2020  4:30 PM Tanda Rockers, MD [facility] None  04/27/2020  3:15 PM [facility] [place] [facility]  05/16/2020  3:30 PM Noralyn Pick, NP [facility]  05/28/2020  3:00 PM [facility] PA/NP [facility] None    BP 122/62   Pulse 76   Temp (!) 97.3 F (36.3 C)   Resp 18   Wt 243 lb (110.2 kg)   SpO2 96%   BMI 32.96 kg/m  CBG's--127-180s  Weight yesterday-244 Last visit weight-250   Pt reports he is doing ok, [facility]. He has 2 more days left of that med for URI.  He also is wanting a portable 02 tank so he can be more mobile. [facility] doc is working [facility]  [facility].  Shower chair order was sent in as well.  She has spoken to them and its going to be shipped out to the home.  He also seen the kidney spec and there were no changes to his meds from that appointment.  He has cancelled multiple times recently for clinic and myself.  No bleeding issues.  No c/p, no dizziness, no increased sob. Lung sounds rt upper lobe a slight wheeze, still coughing.   Pt has been stacking his own pill box since he has cancelled so many of our home visits. Wife states he is doing good.  We had discussion today regarding future home visits and how he can be d/c from paramedicine since he is at a good point right now and managing things on his own, he has his wife for support. He did not like that idea b/c he had gotten used to someone coming out. I encouraged him that he should be happy that he is at a good place and can manage things without difficulty and should things change in the future he can be re-referred. I explained that this is the process of our program and we arent intended to stay in the home forever and this is a good thing.  His wife deals with his refills, she is his form of transportation and handles his appointments.  Pt can be d/c from paramedicine at this time.   Marylouise Stacks, [facility] Paramedic  04/16/20

## 2020-04-18 IMAGING — DX DG CHEST 1V PORT
2 series · 2 of 2 positions shown · non-contrast
Comparison: Chest radiograph 11/12/2018

CLINICAL DATA: Pulmonary edema

EXAM:
PORTABLE CHEST 1 VIEW

[chest ap (1 of 2)]
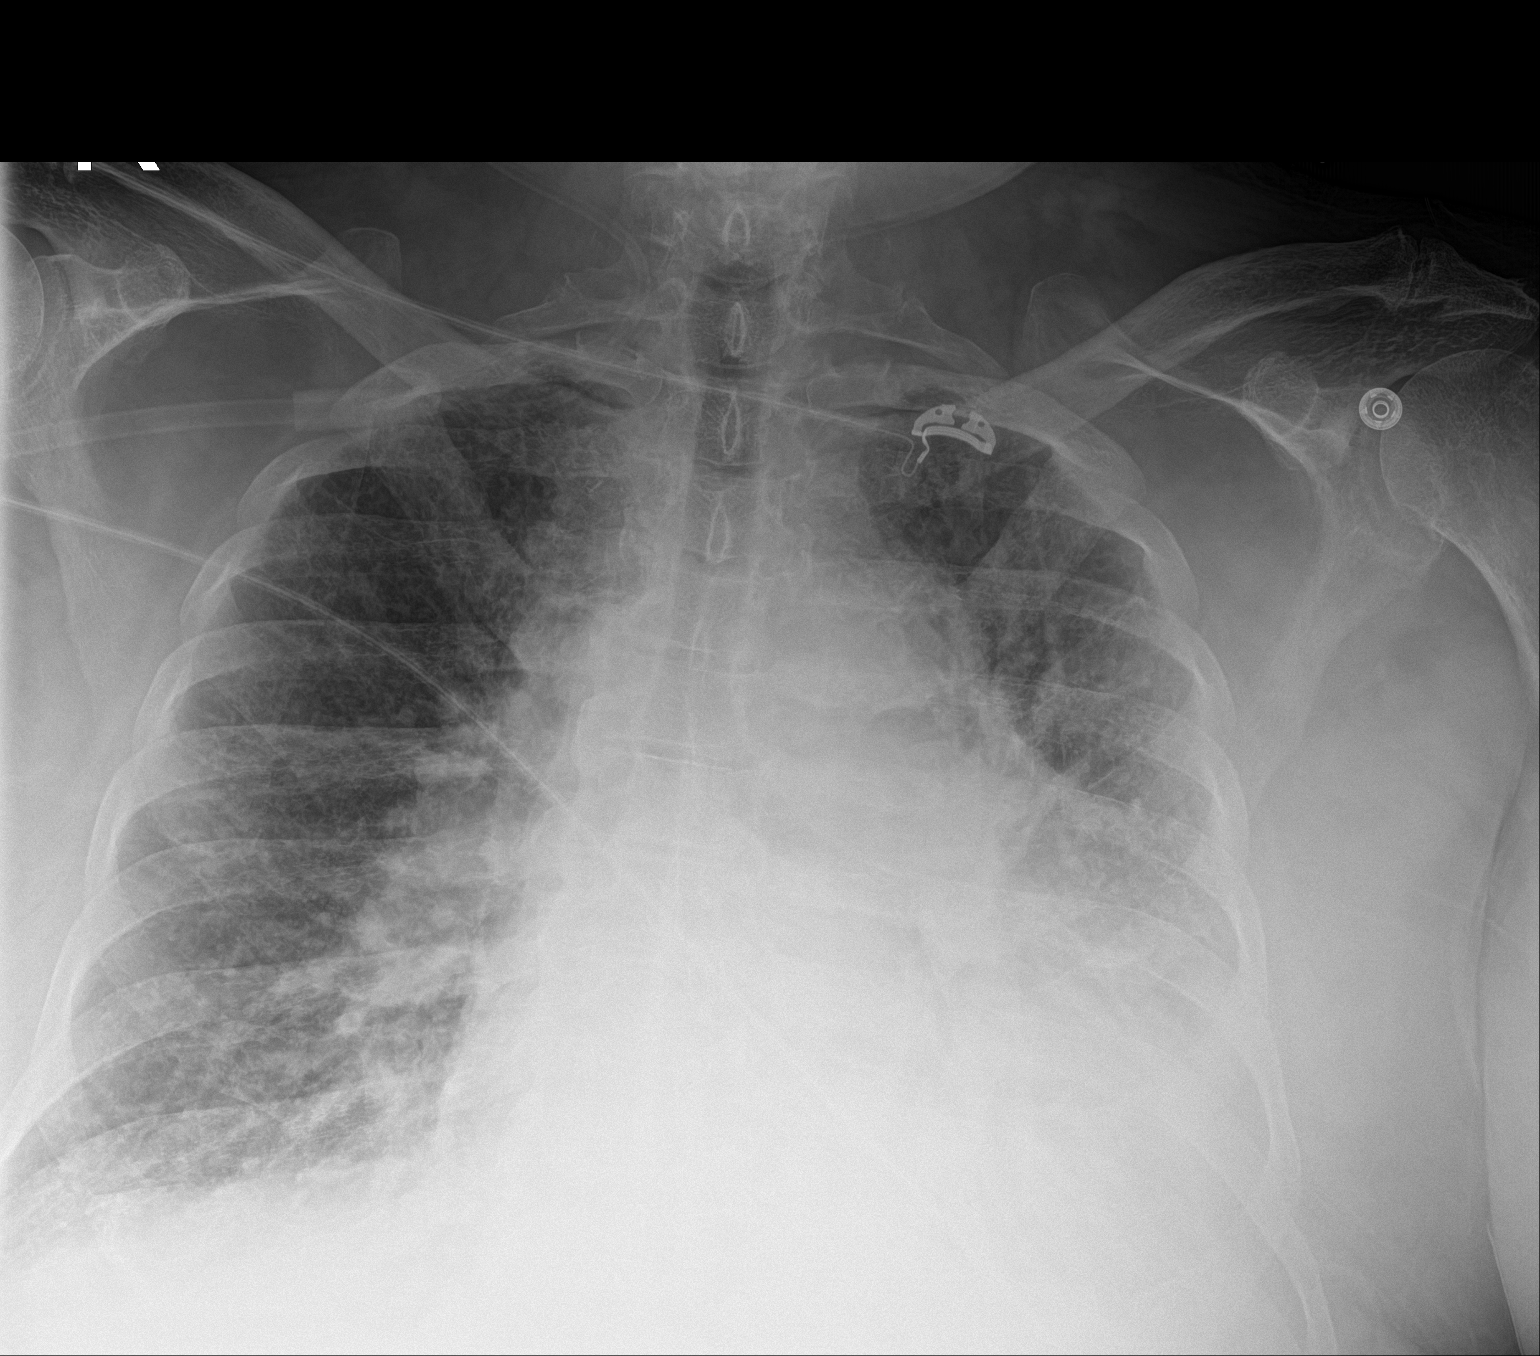

[chest ap (2 of 2)]
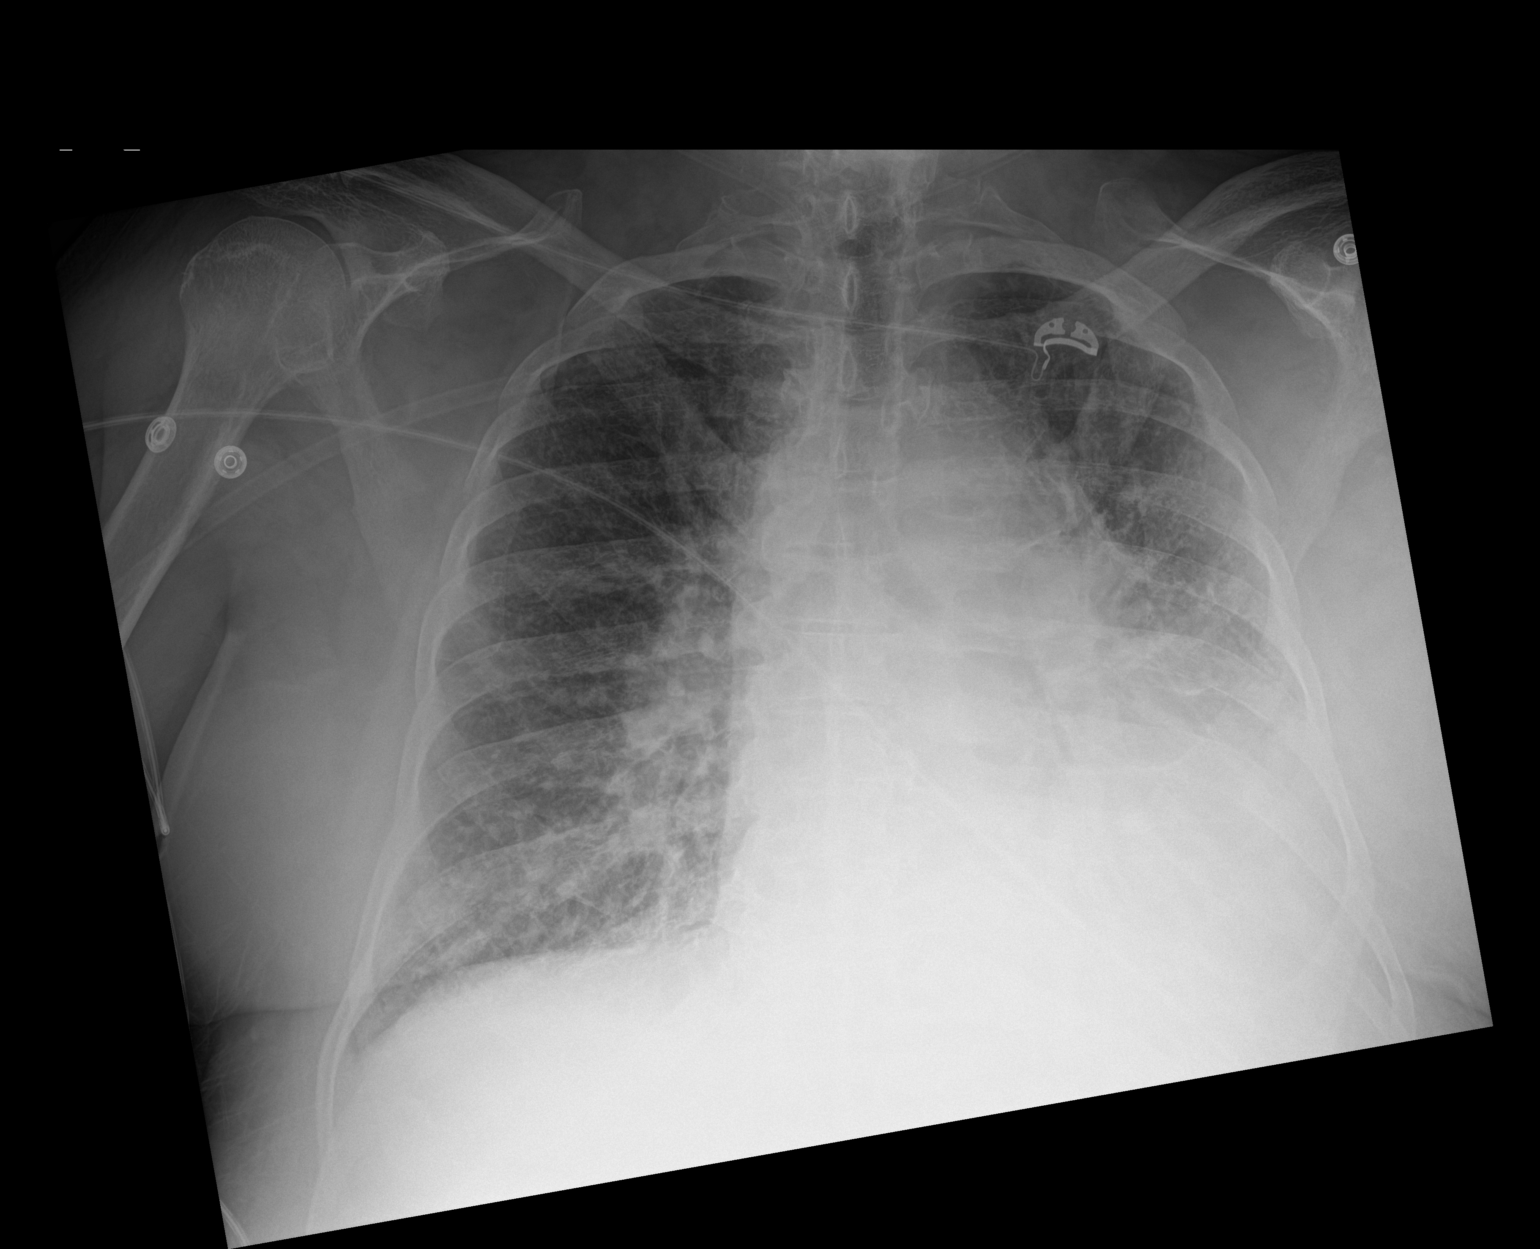

[2 of 2 positions shown; findings below may reference images not displayed]

FINDINGS: Monitoring leads overlie the patient. Stable cardiomegaly. Diffuse
bilateral interstitial pulmonary opacities with persistent
consolidation within the left mid and lower lung. Small to moderate
left pleural effusion. No pneumothorax.
IMPRESSION: Persistent consolidation within the left mid and lower lung which
may represent or focal edema underlying a left effusion or
potentially pneumonia.

Diffuse bilateral interstitial opacities favored to represent edema.

Cardiomegaly.

## 2020-04-24 ENCOUNTER — Ambulatory Visit: Payer: No Typology Code available for payment source | Admitting: Internal Medicine

## 2020-04-25 IMAGING — DX DG CHEST 1V PORT
1 series · 1 of 1 positions shown · non-contrast
Comparison: 11/15/2010

CLINICAL DATA: sob, 89% on home 3L O2. Given 10 albuterol, 1
Atrovent and 125 solumedrol. Now at 100% on Neeraj Kumar. Wheezing and rhonci
in all lobes. sob

EXAM:
PORTABLE CHEST 1 VIEW

[chest]
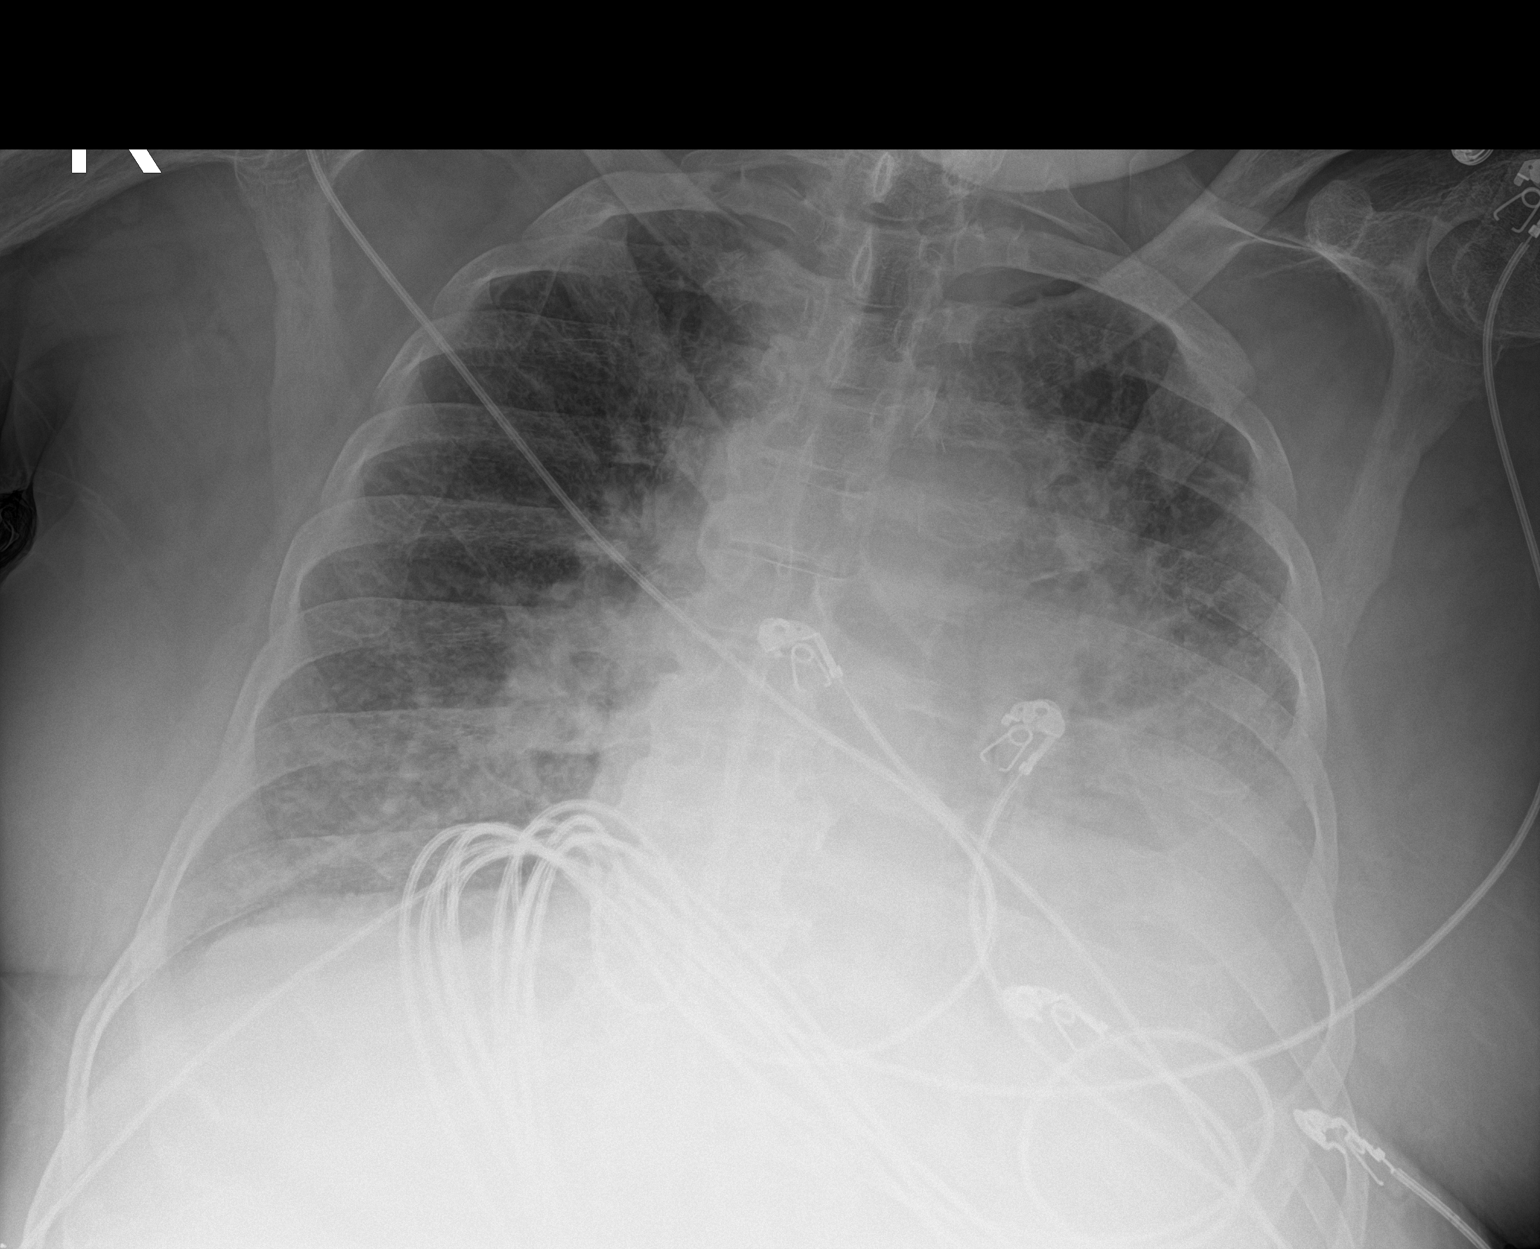

[1 of 1 positions shown; findings below may reference images not displayed]

FINDINGS: Enlarged cardiac silhouette. LEFT basilar atelectasis and effusion.
Fine airspace disease. No interval change.
IMPRESSION: No interval change in congestive heart failure pattern.

## 2020-04-27 ENCOUNTER — Other Ambulatory Visit: Payer: No Typology Code available for payment source

## 2020-04-27 DIAGNOSIS — J449 Chronic obstructive pulmonary disease, unspecified: Secondary | ICD-10-CM | POA: Diagnosis not present

## 2020-04-27 DIAGNOSIS — J441 Chronic obstructive pulmonary disease with (acute) exacerbation: Secondary | ICD-10-CM | POA: Diagnosis not present

## 2020-04-27 DIAGNOSIS — R269 Unspecified abnormalities of gait and mobility: Secondary | ICD-10-CM | POA: Diagnosis not present

## 2020-04-27 DIAGNOSIS — J9621 Acute and chronic respiratory failure with hypoxia: Secondary | ICD-10-CM | POA: Diagnosis not present

## 2020-05-05 IMAGING — DX DG CHEST 1V PORT
1 series · 2 of 2 positions shown · non-contrast
Comparison: Radiograph earlier this day

CLINICAL DATA: Intubation and OG tube placement.

EXAM:
PORTABLE CHEST 1 VIEW

[Series 1: chest · 0.14mm/px · 2 of 2 slices shown]
[im 1/2]
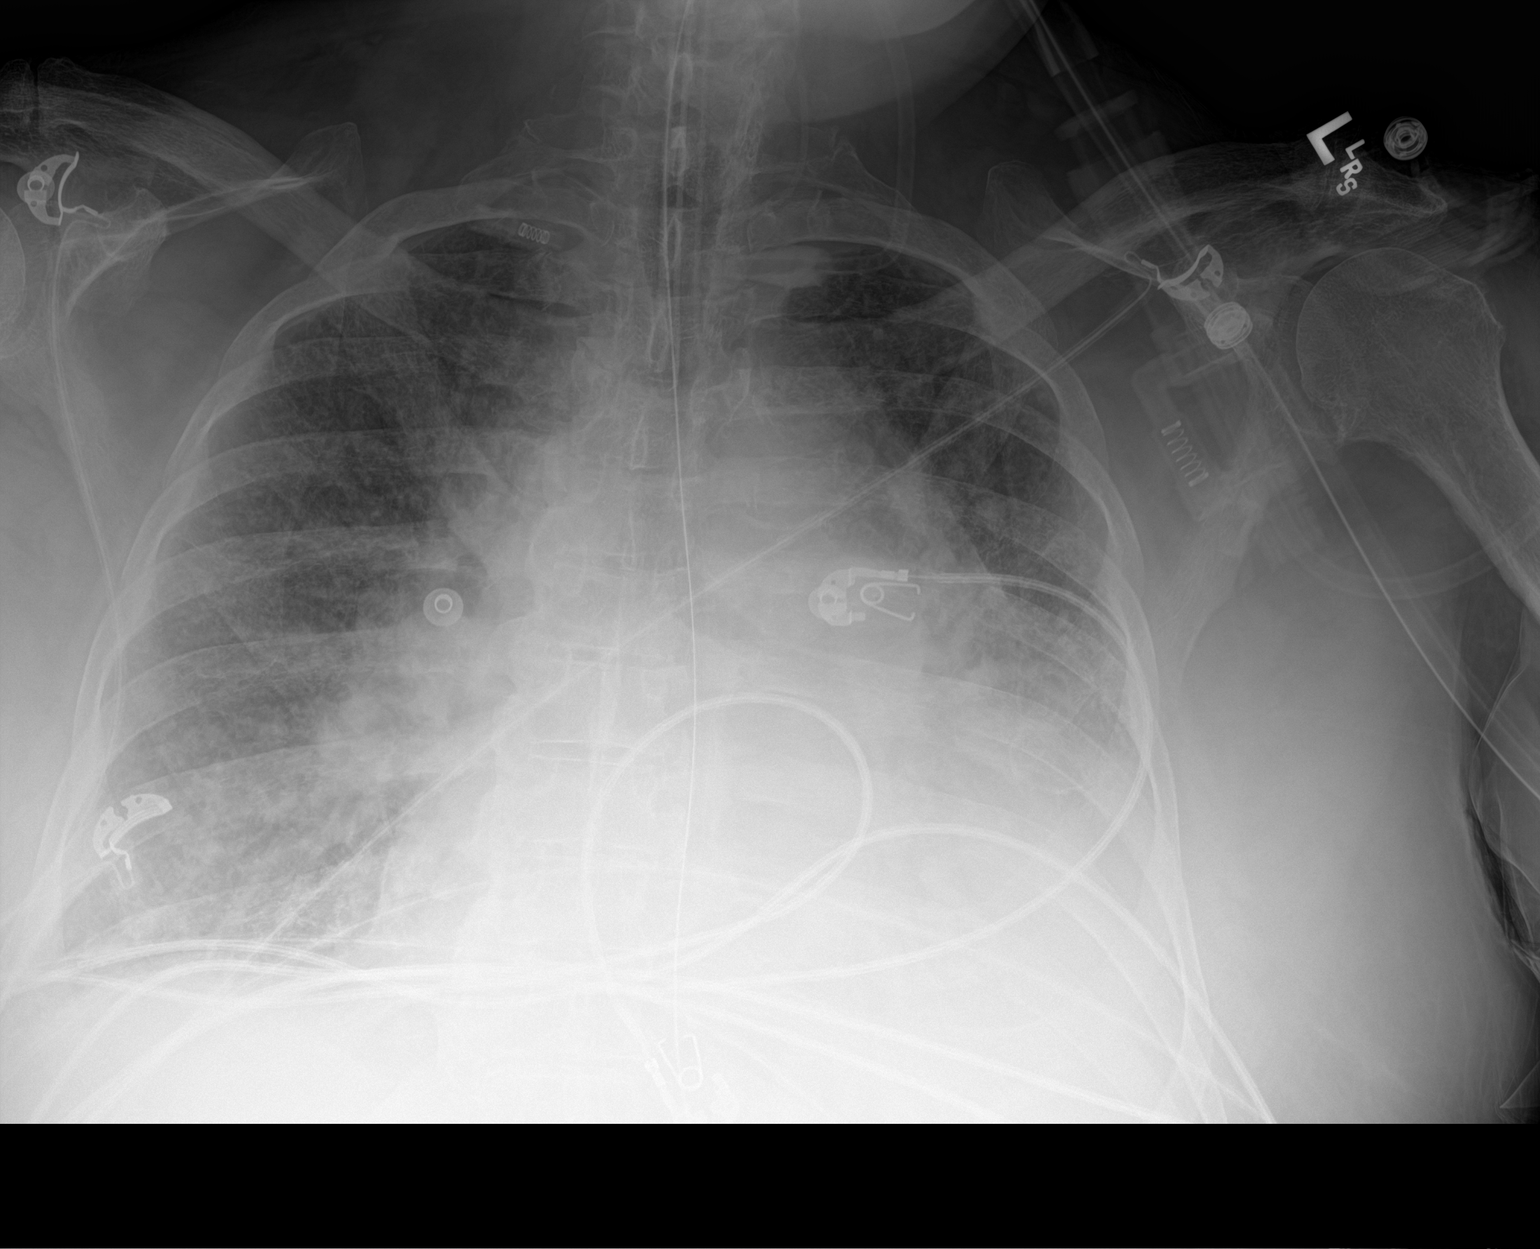
[im 2/2]
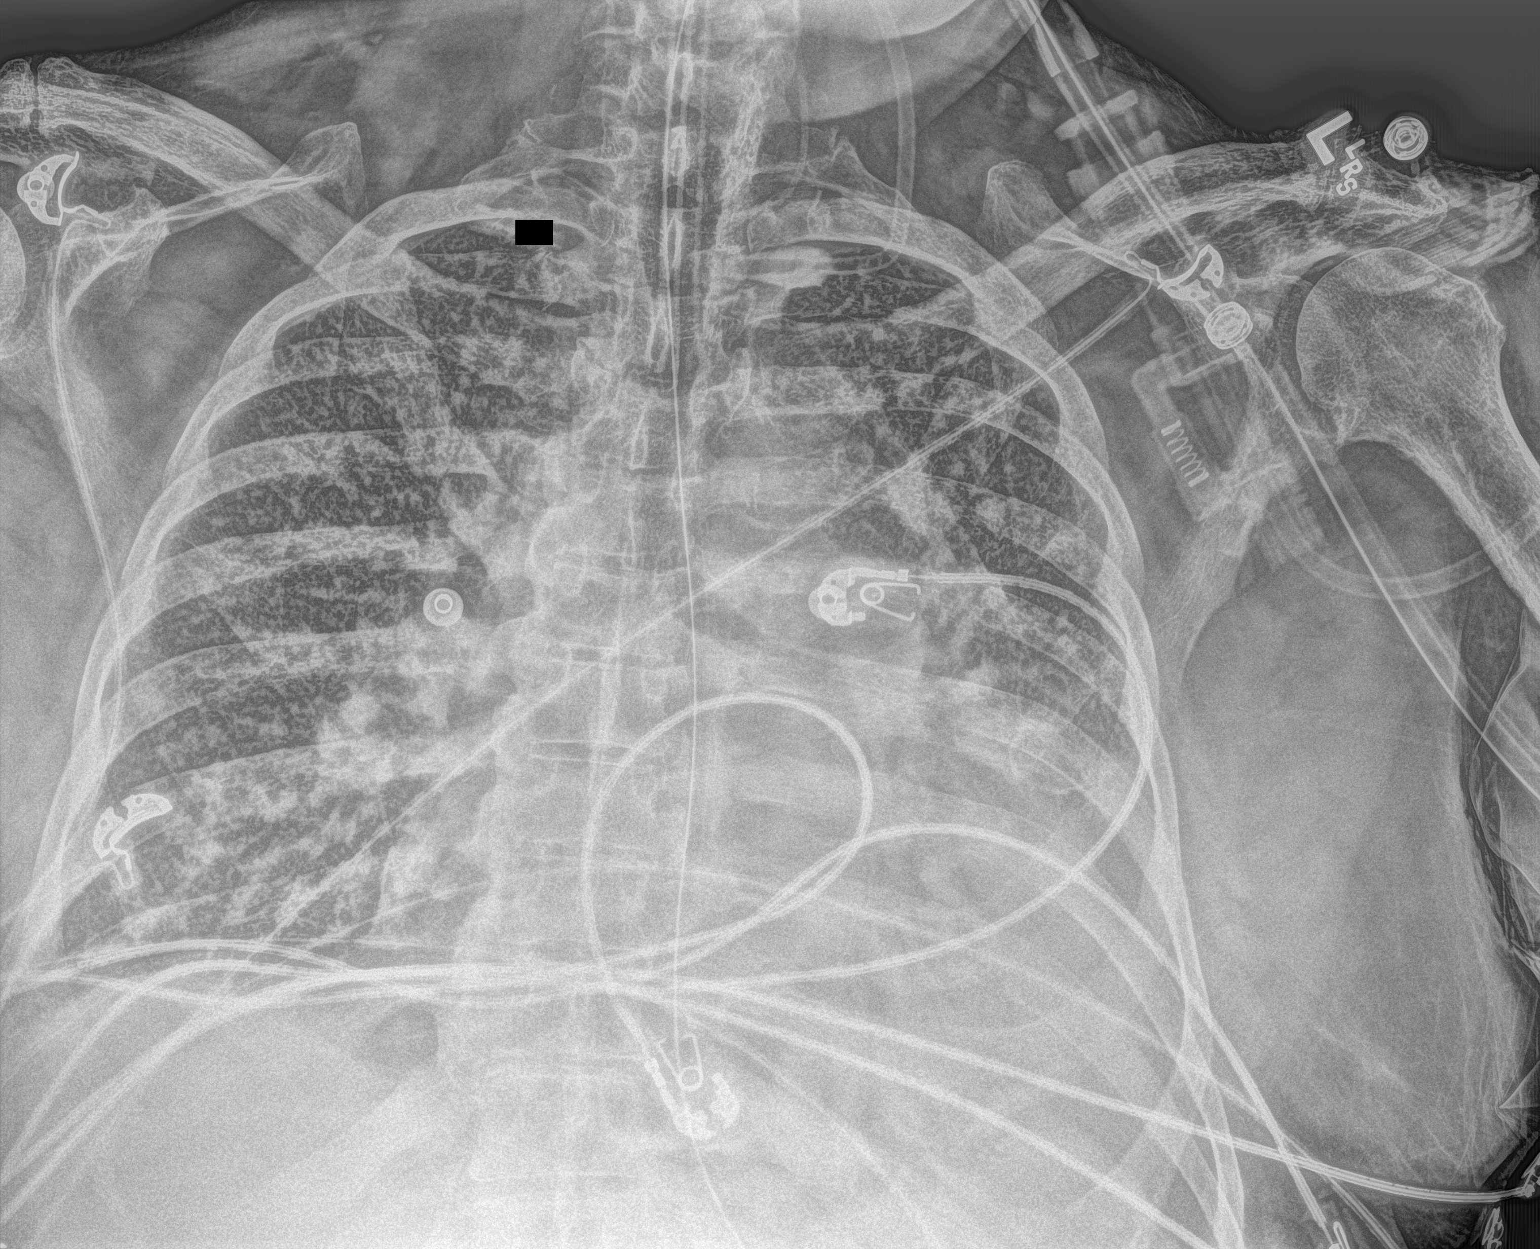

[2 of 2 positions shown; findings below may reference images not displayed]

FINDINGS: Endotracheal tube tip 5.5 cm from the carina. Enteric tube tip below
the diaphragm, side-port not well visualized. Again seen
cardiomegaly, vascular congestion and pulmonary edema. Retrocardiac
opacity may be overlapping soft tissue attenuation or pleural
effusion.
IMPRESSION: 1. Endotracheal tube 5.5 cm from the carina. Enteric tube tip below
the diaphragm, side-port not well visualized.
2. Cardiomegaly, vascular congestion and pulmonary edema.
Retrocardiac opacity may be overlapping soft tissue attenuation or
pleural effusion.

## 2020-05-05 IMAGING — DX DG CHEST 1V PORT
1 series · 1 of 1 positions shown · non-contrast
Comparison: 11/21/2018, 11/14/2018, 11/21/2018

CLINICAL DATA: Shortness of breath

EXAM:
PORTABLE CHEST 1 VIEW

[chest]
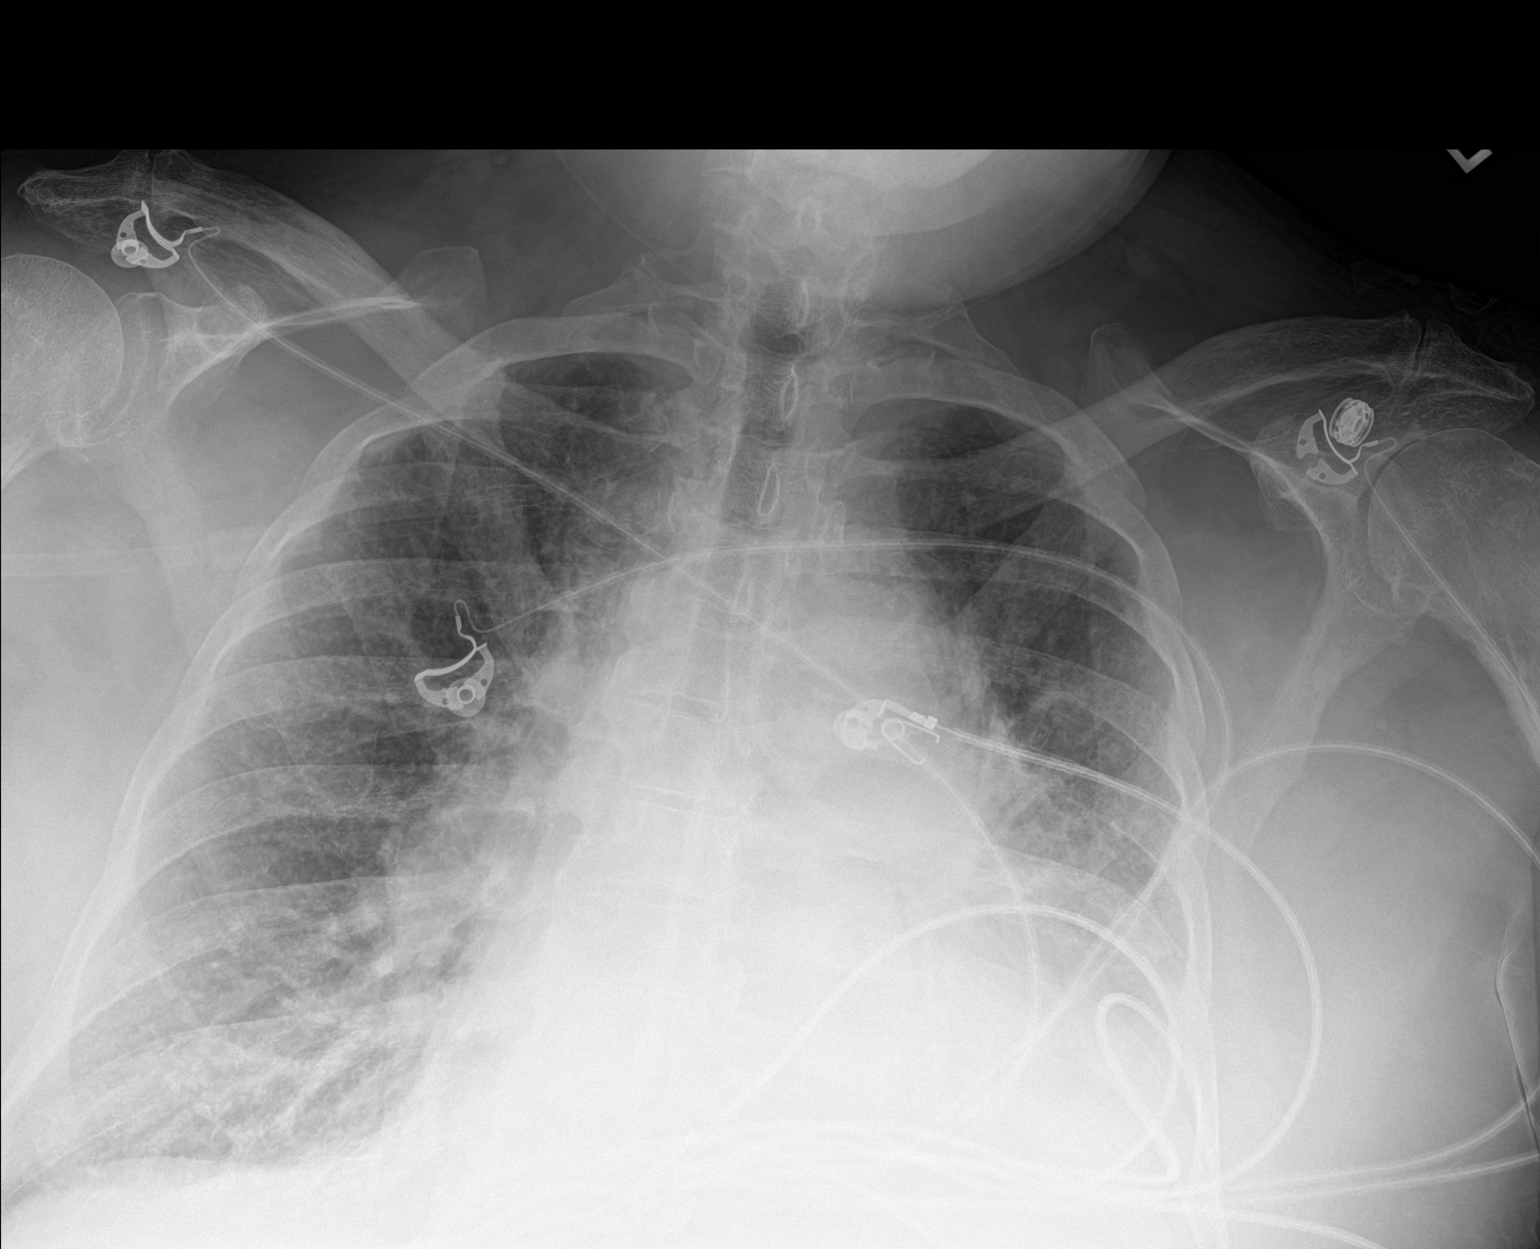

[1 of 1 positions shown; findings below may reference images not displayed]

FINDINGS: Non inclusion of the left base. Cardiomegaly with vascular
congestion and moderate pulmonary edema. Suspected left pleural
effusion. No pneumothorax.
IMPRESSION: Similar appearance of cardiomegaly, vascular congestion, and
moderate pulmonary edema with probable left pleural effusion

## 2020-05-05 IMAGING — DX DG CHEST 1V PORT
2 series · 2 of 2 positions shown · non-contrast
Comparison: Chest radiograph 12/01/2018 at 8007 hours

CLINICAL DATA: Respiratory failure.  New central venous line.

EXAM:
PORTABLE CHEST 1 VIEW

[chest ap (1 of 2)]
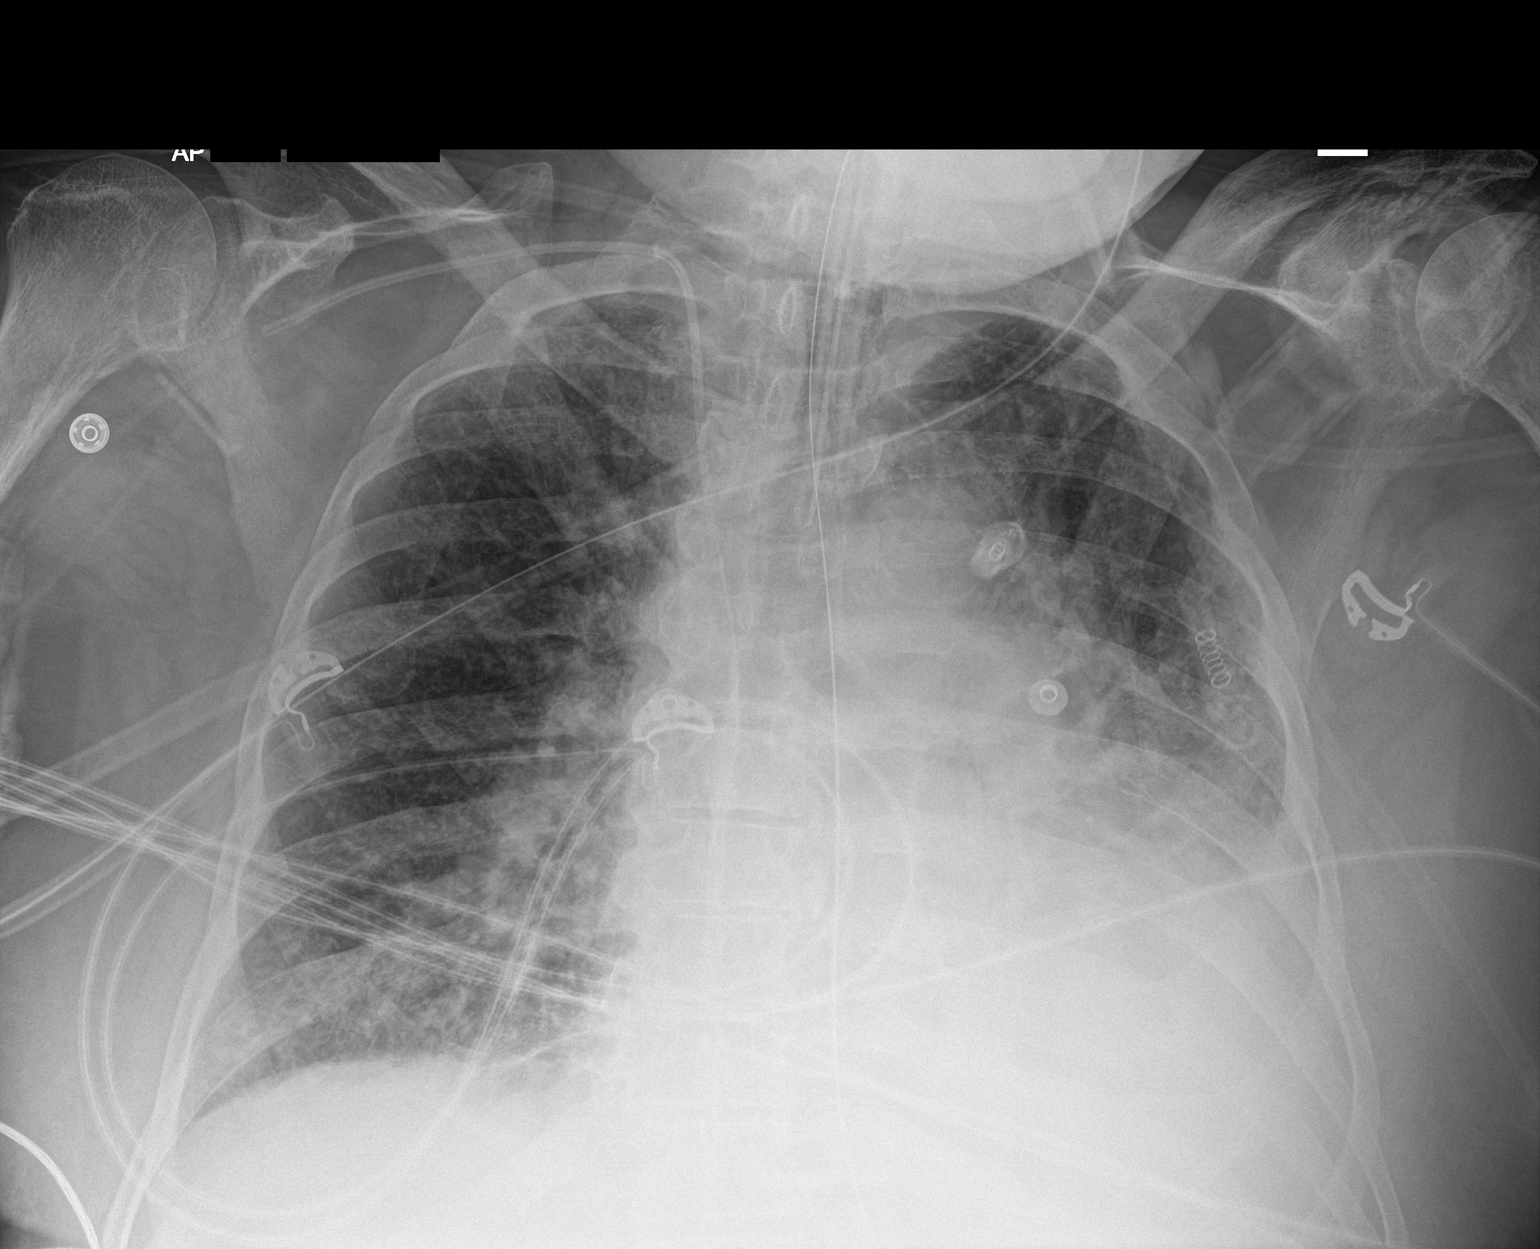

[chest ap (2 of 2)]
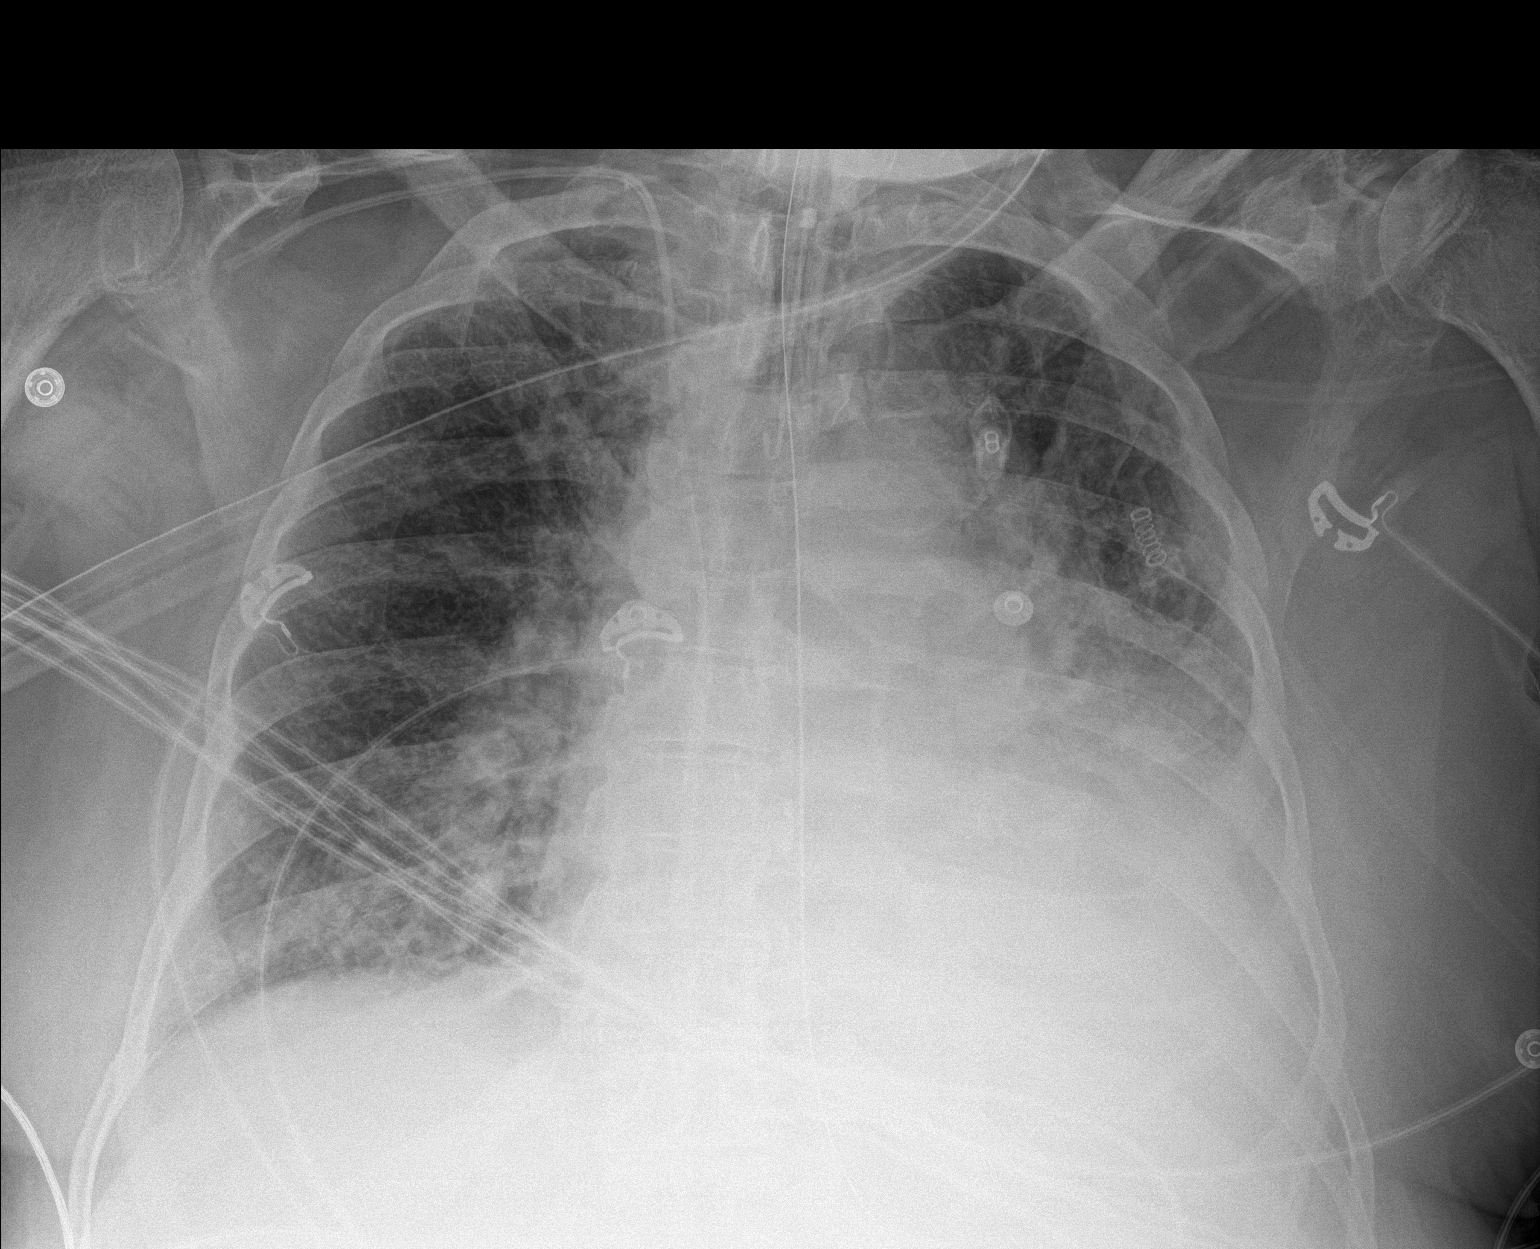

[2 of 2 positions shown; findings below may reference images not displayed]

FINDINGS: New RIGHT subclavian central venous catheter with distal tip
projecting distal superior vena cava. Endotracheal tube tip projects
3 cm above the carina. Nasogastric tube past GE junction region, out
of field of view.

Stable cardiomegaly. Pulmonary vascular congestion interstitial
prominence with retrocardiac consolidation. Small LEFT pleural
effusion.

Unremarkable.
IMPRESSION: New RIGHT subclavian central venous catheter distal tip projects in
distal superior vena cava. Endotracheal tube tip projects 3 cm above
the carina. Nasogastric tube past GE junction region. No
pneumothorax.

Stable cardiomegaly and interstitial prominence most compatible
pulmonary edema. Persistent retrocardiac consolidation and small
LEFT pleural effusion.

## 2020-05-05 IMAGING — US US RENAL
1 series · 14 of 21 positions shown · non-contrast
Comparison: None.

CLINICAL DATA: Acute renal failure

EXAM:
RENAL / URINARY TRACT ULTRASOUND COMPLETE

[Series 1: us renal · 14 of 21 slices shown]
[im 1/21]
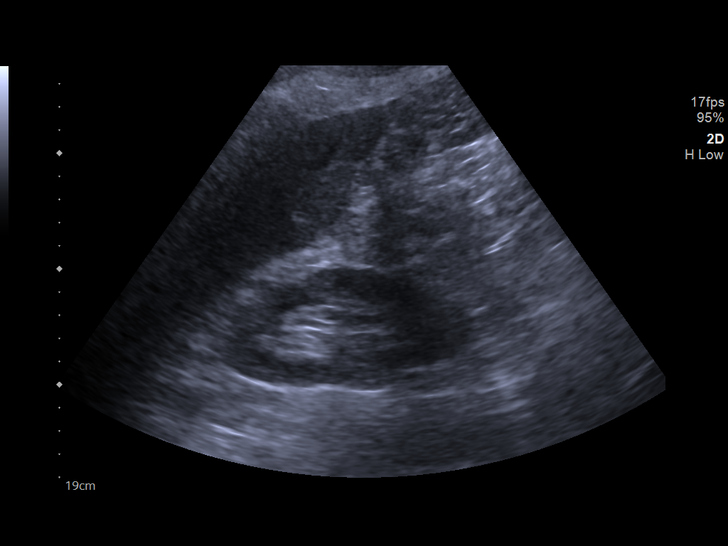
[im 3/21]
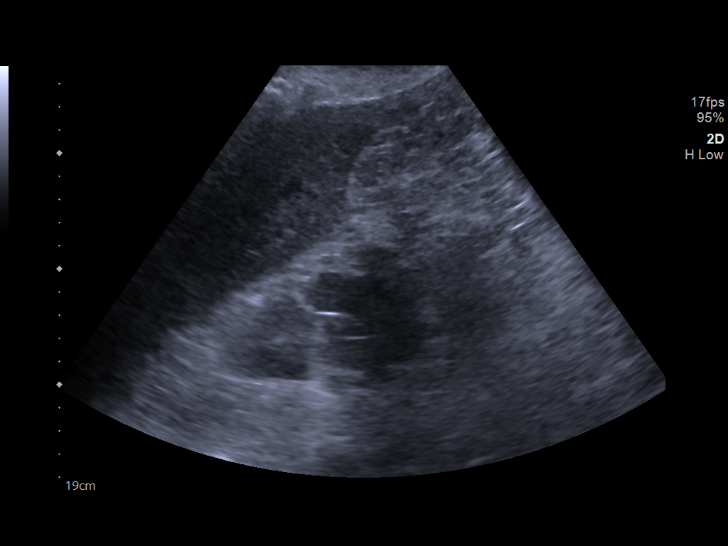
[im 4/21]
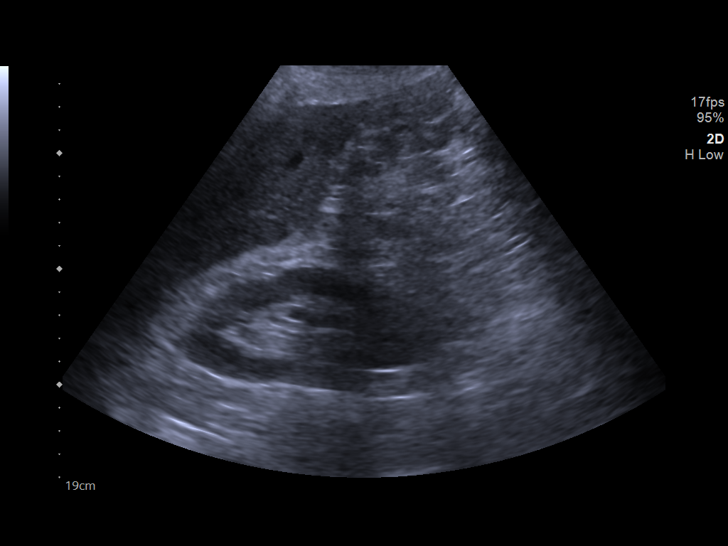
[im 6/21]
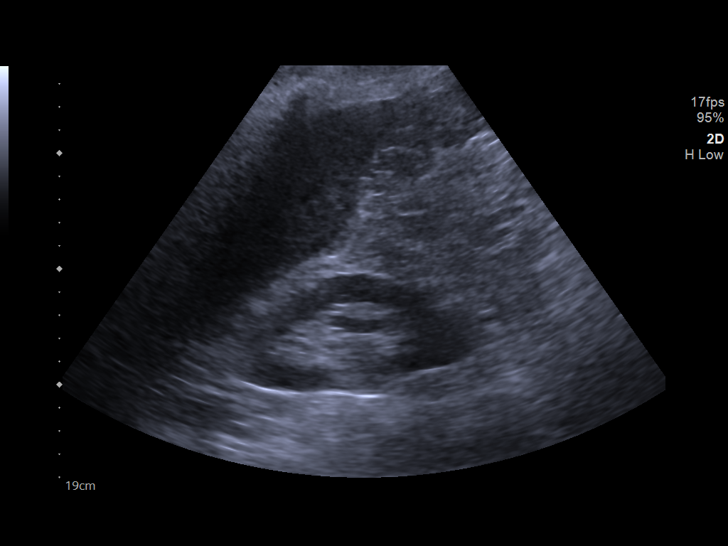
[im 7/21]
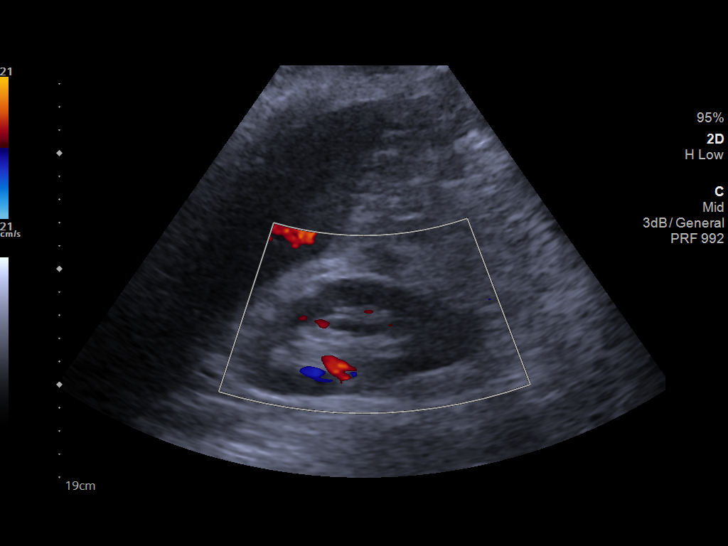
[im 9/21]
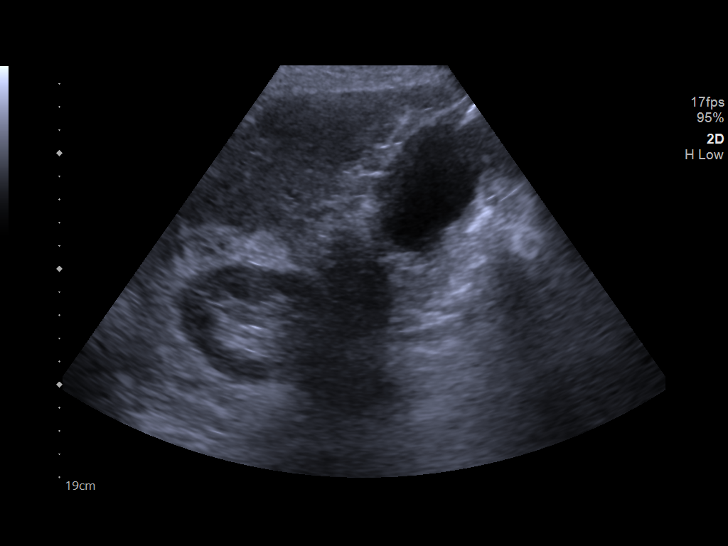
[im 10/21]
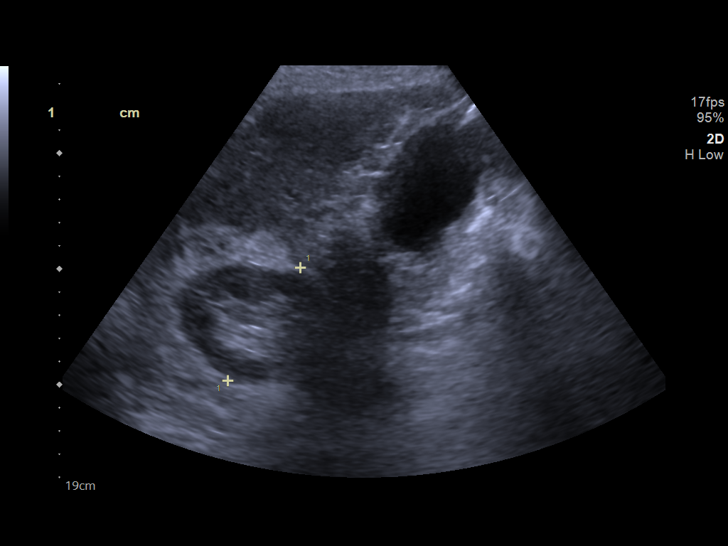
[im 12/21]
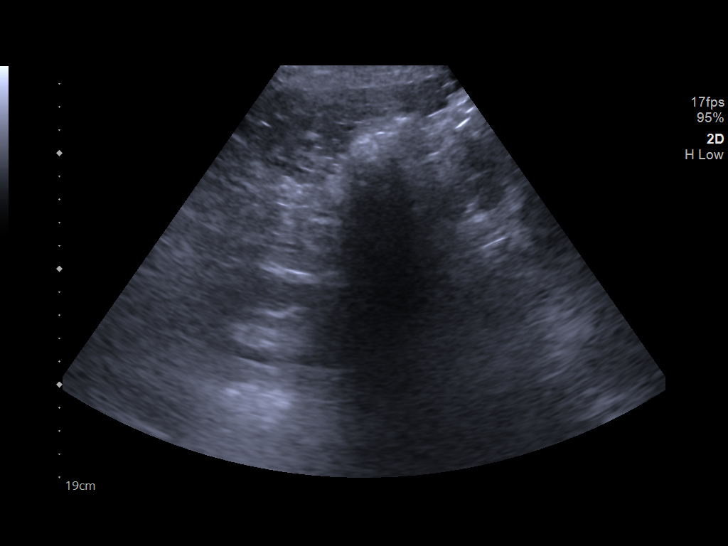
[im 13/21]
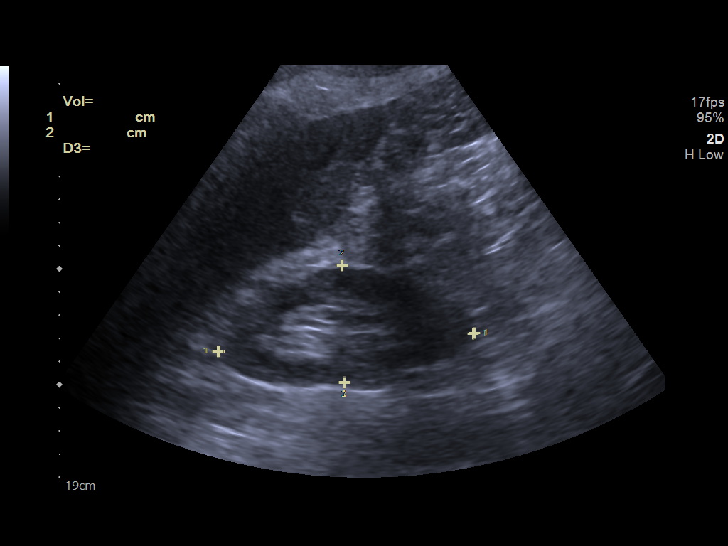
[im 15/21]
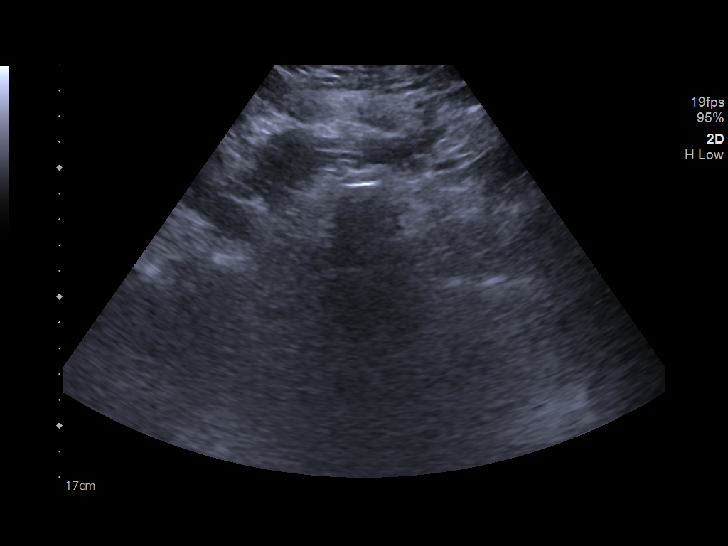
[im 16/21]
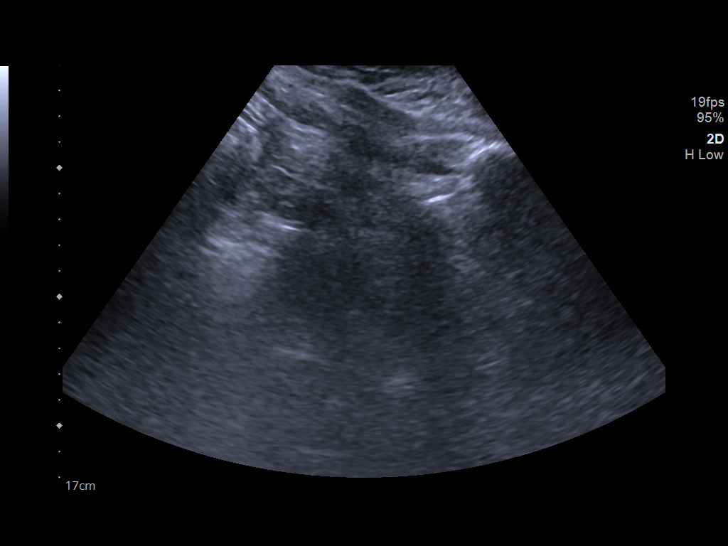
[im 18/21]
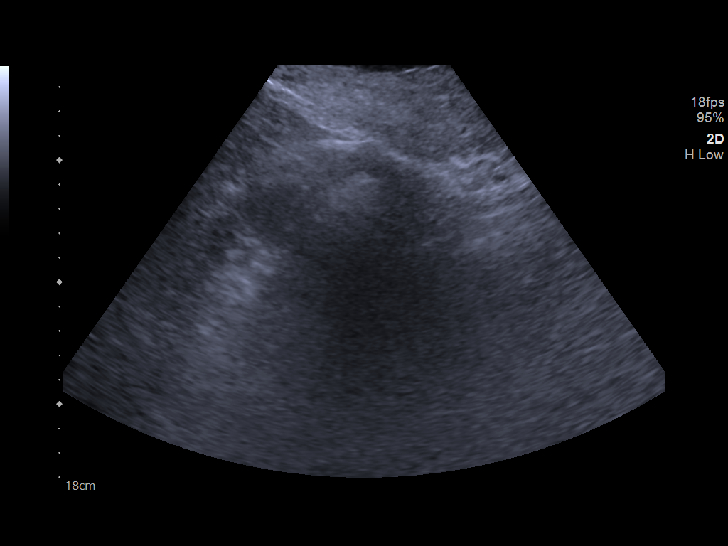
[im 19/21]
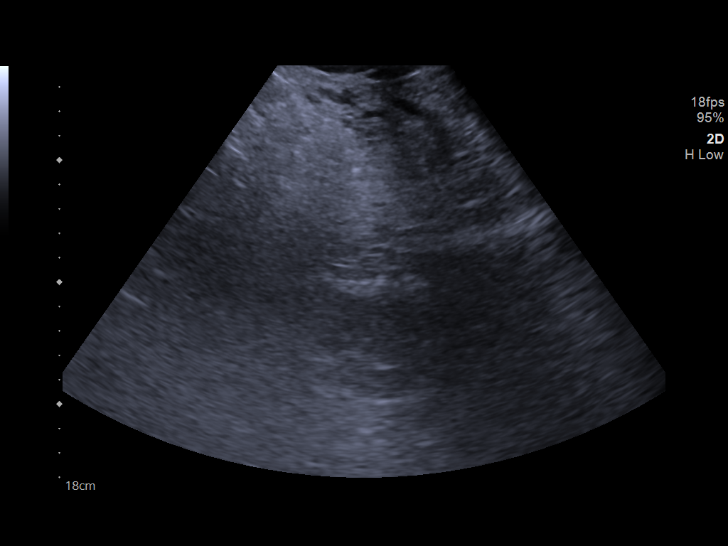
[im 21/21]
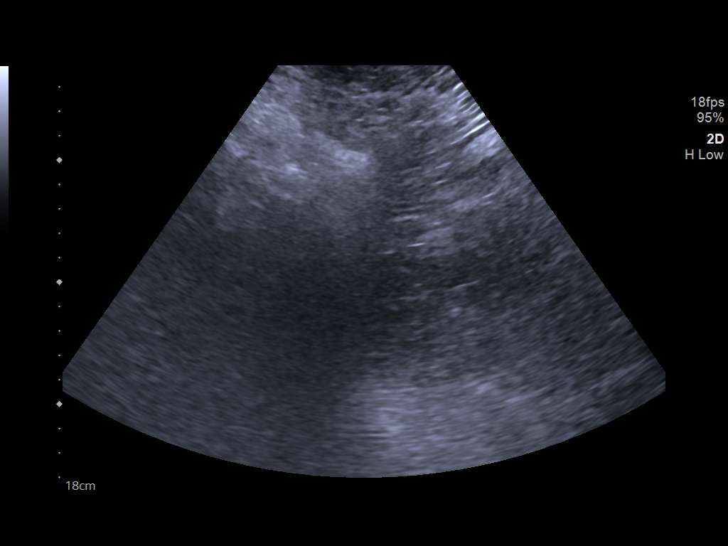

[14 of 21 positions shown; findings below may reference images not displayed]

FINDINGS: Right Kidney:

Renal measurements: 11.1 x 5.1 x 5.8 cm = volume: 172 mL .
Echogenicity within normal limits. No mass or hydronephrosis
visualized.

Left Kidney:

Not well visualized.

Bladder:

Decompressed by Foley catheter.
IMPRESSION: Right kidney is within normal limits. The left kidney is not
visualized. Correlation to any prior surgical excision is
recommended.

## 2020-05-10 ENCOUNTER — Other Ambulatory Visit: Payer: No Typology Code available for payment source

## 2020-05-11 IMAGING — CR DG CHEST 2V
2 series · 2 of 2 positions shown · non-contrast
Comparison: 12/01/2018

CLINICAL DATA: Fever, cough, shortness of breath

EXAM:
CHEST - 2 VIEW

[chest lat]
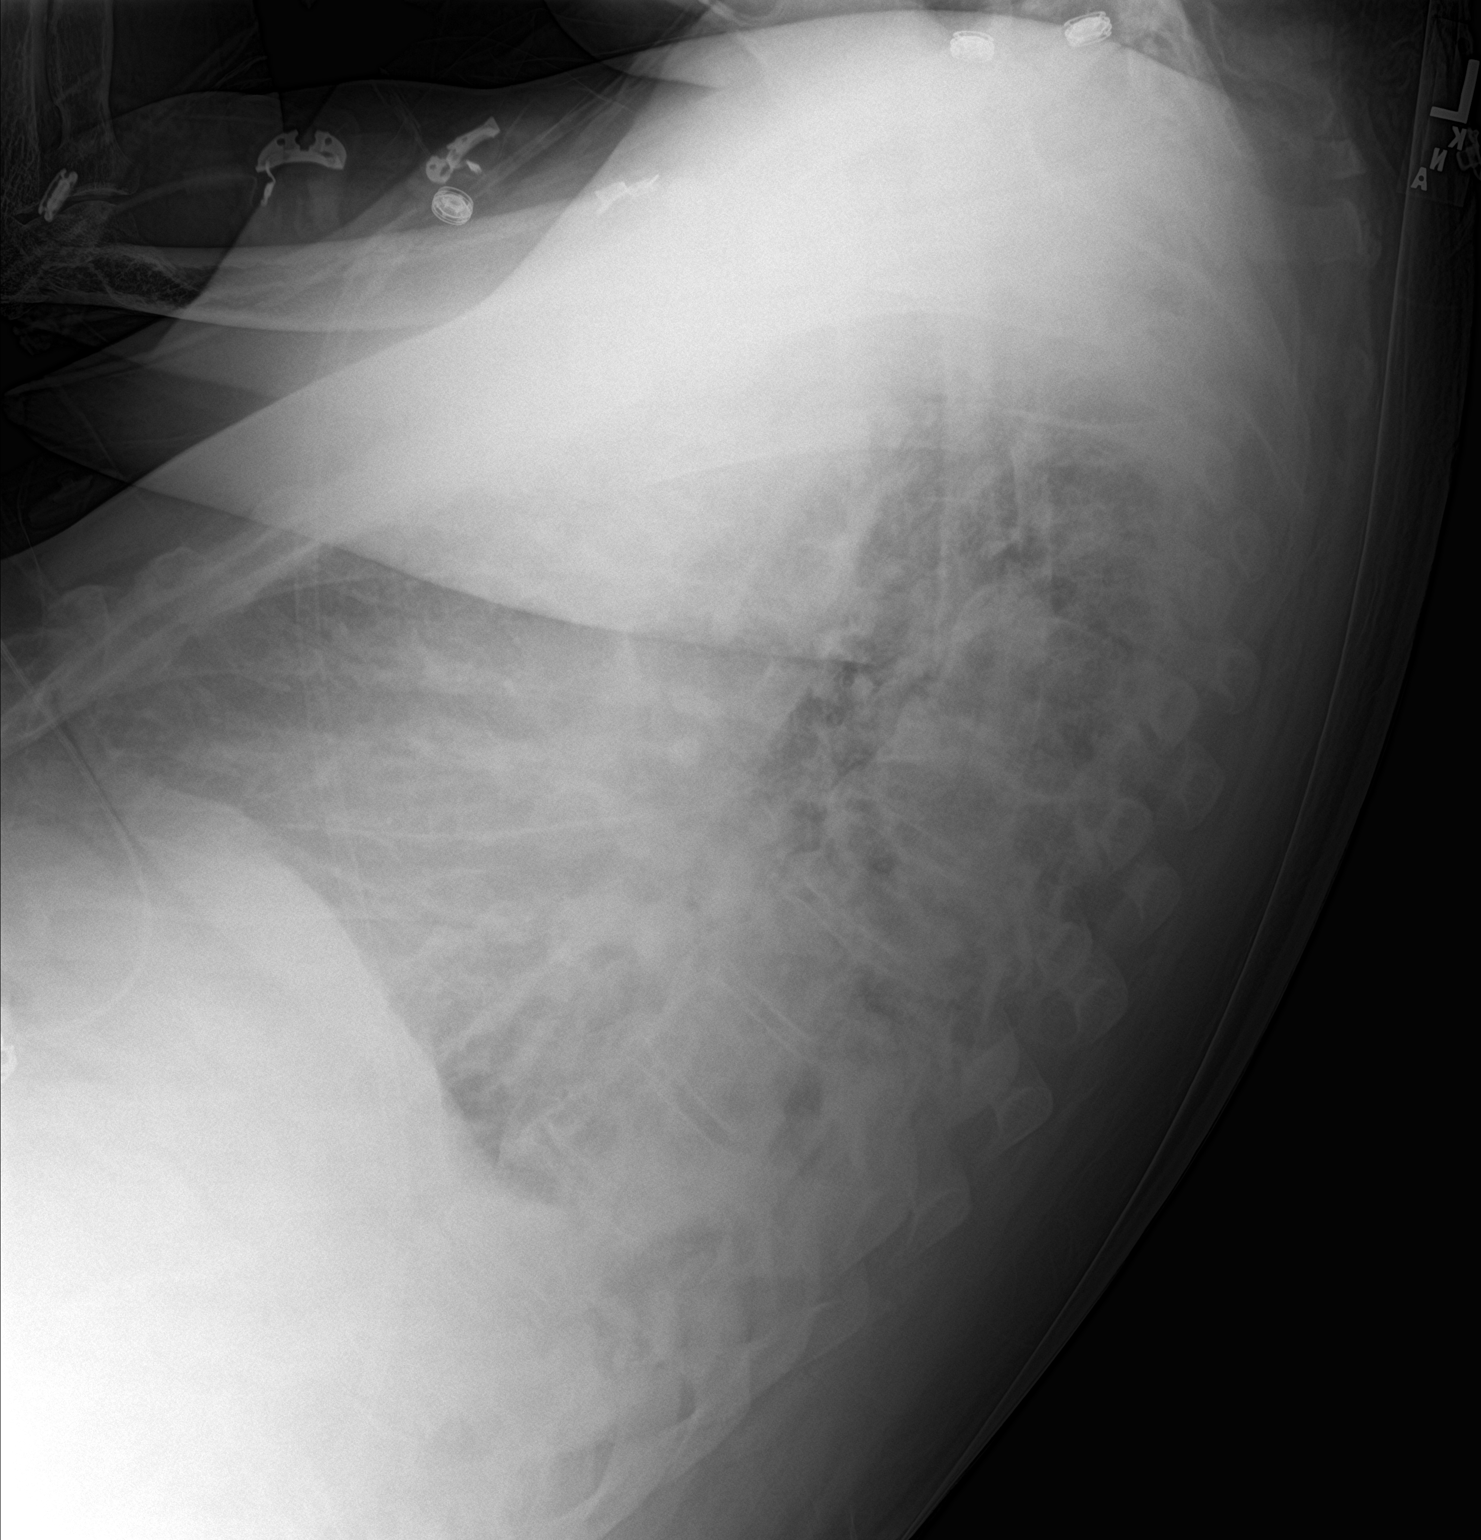

[chest ap]
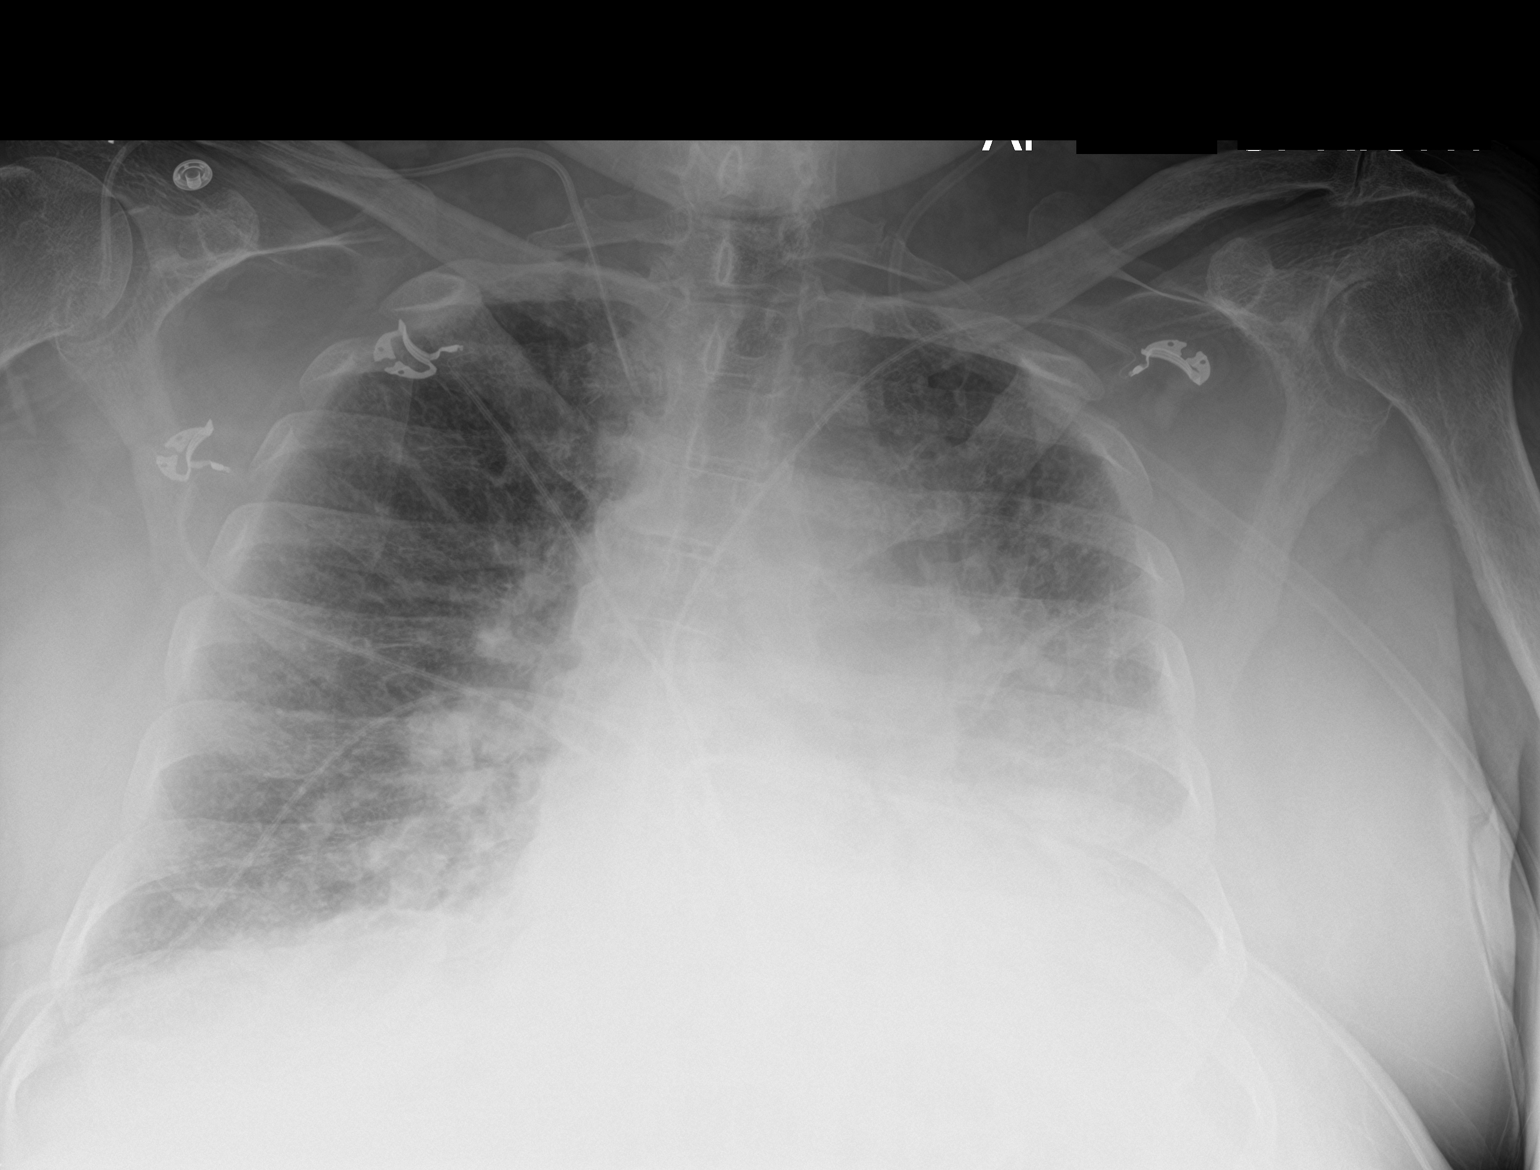

[2 of 2 positions shown; findings below may reference images not displayed]

FINDINGS: Right internal jugular central line tip is in the right innominate
vein, retracted since prior study. Cardiomegaly. Bilateral
interstitial prominence with perihilar and lower lobe opacities on
the right and diffuse airspace opacity on the left. Small bilateral
pleural effusions, left greater than right.
IMPRESSION: Retraction of the right PICC line with the tip now in the right
innominate vein.

Cardiomegaly. Bilateral asymmetric airspace disease, left greater
than right could reflect asymmetric edema or infection.

Small effusions.

## 2020-05-14 ENCOUNTER — Encounter (HOSPITAL_COMMUNITY): Payer: No Typology Code available for payment source

## 2020-05-16 ENCOUNTER — Ambulatory Visit: Payer: Medicare HMO | Admitting: Nurse Practitioner

## 2020-05-24 ENCOUNTER — Other Ambulatory Visit: Payer: No Typology Code available for payment source

## 2020-05-25 ENCOUNTER — Other Ambulatory Visit: Payer: No Typology Code available for payment source

## 2020-05-27 DIAGNOSIS — J441 Chronic obstructive pulmonary disease with (acute) exacerbation: Secondary | ICD-10-CM | POA: Diagnosis not present

## 2020-05-27 DIAGNOSIS — R269 Unspecified abnormalities of gait and mobility: Secondary | ICD-10-CM | POA: Diagnosis not present

## 2020-05-27 DIAGNOSIS — J9621 Acute and chronic respiratory failure with hypoxia: Secondary | ICD-10-CM | POA: Diagnosis not present

## 2020-05-27 DIAGNOSIS — J449 Chronic obstructive pulmonary disease, unspecified: Secondary | ICD-10-CM | POA: Diagnosis not present

## 2020-05-27 NOTE — Progress Notes (Signed)
Heart Failure  Note Date:  05/28/2020   ID:  Nicholas Caldwell, DOB 06/28/1946, MRN [id number]   Provider location: [facility] Type of Visit: Established patient  PCP:  [name], MD  HF Cardiology: Dr [name]  Nephrology:  Dr Marval Regal  History of Present Illness: Nicholas Caldwell is a 74 y.o. male with a history of chronic systolic CHFdue to NICM (EF 40-45%)with moderate RV failure, COPD on 3 L O2, DM, HTN, OHS/OSA on CPAP, PAF s/p DCCV, hyperlipidemia, CAD, and noncompliance.He has multiple admissions due to HF and respiratory failure.   Most recently admitted from 03/07/19-03/31/19 with massive volume overload with severe RV failure, AFL and cardiorenal syndrome. Attempts at diuresis limited by worsening creatinine which peaked at 2.6. Treated with milrinone but still unable to get CVP below 15. Hospital course c/b severe non-compliance with high fluid intake, PAFL, penile wound, acute versus subacute CVA and Citrobacter UTI. Underwent DC-CV of AFL into NSR.  D/c'd home on 5/28 with weight of 270 and creatinine 1.99.   Readmitted6/6/2020with recurrent hypoxic/hypercarbic respiratory failure. (ABG 7.27/66/55/86%). Found to have severe hyperkalemia (K>7.5) and large left pleural effusion. Weightwasup about 10 pounds since d/c.Diuresed with IV lasix and later transitioned to torsemide 80 mg twice a day.  Discharge weight on 04/14/2019 was 251 pounds.   Admitted 12/04/09 with A/C systolic/diastolic heart failure. Bisoprolol was stopped on admit and not continued on discharge. Diuresed with with IV lasix and transitioned back to torsemide 80 mg twice a day + metolazone 4 times a week.   Admitted 03/08/19 with A/C systolic/diastolic heart failure + AECOPD. Diuresed with IV lasix and discharged on torsemide 80 mg twice a day + metolazone 4x a week. Treated with steroids and antibiotics. He was placed on low dose bisoprolol.  Palliative Care consulted. MOST form was  introduced. He wished to continue full code.   He was admitted in 10/20 with RLL PNA (treated with ceftriaxone), CHF exacerbation, and right pleural effusion.  He had right-sided thoracentesis.  He was admitted again in 10/20 with PEA arrest, thought to be due to overuse of narcotic pain meds and underuse of Bipap.  He was noted to be in atrial fibrillation and amiodarone was started, he went back into NSR. He was diuresed for CHF.   Today he returns for HF follow up.Overall feeling fair.  Remains SOB with exertion. Limited walking in his home. Remains extremely weak. Sleeping with HOB elevated. Denies PND/Orthopnea.Using 3 liters [place]. Using CPAP every night.  Appetite ok. No fever or chills. Weight at home 245 pounds. Taking all medications.   Labs (10/20): K 3.3, creatinine 1.7 Labs (11/20): K 3.9, creatinine 2.21, hgb 10.1 Labs (2/21): K 4.1, creatinine 4.01 => 3.48, hgb 8.8, BNP 1176 Labs (02/17/2020):  Creatinine 3.52   PMH: 1. Chronic systolic CHF: Nonischemic cardiomyopathy, diagnosed in 2016.  - LHC (1/16) with mild nonobstructive CAD.  - Echo (12/17): EF 35-40%, mildly dilated RV with mild to moderately decreased RV systolic function, PASP 57 mmHg.  - Echo (3/20): EF 45-50%, mild LVH, D-shaped septum with severe RV dilation and RV function.  - TEE (3/20): EF 40-45%, moderate LVH, severe RV dilation with moderate dysfunction.  - RHC (2/20): mean RA 10, PA 52/16 mean 30, mean PCWP 10, CI 3.85, PVR 2.2 WU - Echo (9/20): EF >65%, moderate RV dilation with mildly decreased systolic function.  - Echo (10/20): EF 60-65%, severe RV dilation with severely decreased RV systolic function.  2. Atrial  fibrillation: Paroxysmal. Severe LAE, has seen Dr Rayann Heman and thought to have low chance of success with atrial fibrillation ablation. Tikosyn not used due to long QT. He was on amiodarone in the past but this was stopped due to long QT.  - DCCV 3/20, 5/20.  3. COPD: On 3 L home oxygen.  4. Type II  diabetes 5. Hyperlipidemia 6. HTN 7. OHS/OSA: Home oxygen + CPAP.  8. CAD: LHC (2/20) with 50-60% distal LAD, 50% proximal LCx, 50% proximal RCA (nondominant RCA).  9. CVA: 5/20.  10. CKD: Stage 3.  11. ABIs (9/20): normal.  12. PEA arrest: 10/20, due to overuse of narcotic pain meds and underuse of Bipap.  13. Tremor  Current Outpatient Medications  Medication Sig Dispense Refill  . albuterol (VENTOLIN HFA) 108 (90 Base) MCG/ACT inhaler Inhale 2 puffs into the lungs every 6 (six) hours as needed for wheezing or shortness of breath.    Marland Kitchen amiodarone (PACERONE) 200 MG tablet Take 100 mg by mouth daily.    Marland Kitchen apixaban (ELIQUIS) 5 MG TABS tablet Take 1 tablet (5 mg total) by mouth 2 (two) times daily. (Patient taking differently: Take 2.5 mg by mouth 2 (two) times daily. ) 60 tablet 1  . budesonide-formoterol (SYMBICORT) 160-4.5 MCG/ACT inhaler Inhale 2 puffs into the lungs as needed (shortness of breath).     . Ensure Max Protein (ENSURE MAX PROTEIN) LIQD Take 330 mLs (11 oz total) by mouth daily.    . famotidine (PEPCID) 20 MG tablet Take 1 tablet (20 mg total) by mouth 2 (two) times daily. 60 tablet 2  . FEROSUL 325 (65 Fe) MG tablet Take 325 mg by mouth 2 (two) times daily.    . fluticasone (FLONASE) 50 MCG/ACT nasal spray Place 2 sprays into both nostrils daily as needed for allergies.     Marland Kitchen gabapentin (NEURONTIN) 300 MG capsule Take 1 capsule (300 mg total) by mouth 2 (two) times daily. (Patient taking differently: Take 300 mg by mouth daily. ) 60 capsule 0  . hydrocortisone (ANUSOL-HC) 2.5 % rectal cream Place 1 application rectally 2 (two) times daily as needed for hemorrhoids or itching.     . hydrocortisone 1 % lotion Apply 1 application topically See admin instructions. Apply small amount to back twice daily for itchy rash as needed    . insulin aspart (NOVOLOG) 100 UNIT/ML injection Inject 4 Units into the skin 3 (three) times daily with meals. 10 mL 0  . insulin aspart protamine -  aspart (NOVOLOG MIX 70/30 FLEXPEN) (70-30) 100 UNIT/ML FlexPen Inject 0.1 mLs (10 Units total) into the skin 2 (two) times daily. 15 mL 0  . Insulin Syringe 27G X 1/2" 0.5 ML MISC 100 Syringes by Does not apply route 3 (three) times daily. 100 each 1  . ipratropium-albuterol (DUONEB) 0.5-2.5 (3) MG/3ML SOLN Take 3 mLs by nebulization every 6 (six) hours as needed (shortness of breath/wheezing). 360 mL 0  . ipratropium-albuterol (DUONEB) 0.5-2.5 (3) MG/3ML SOLN Take 3 mLs by nebulization every 6 (six) hours as needed. 360 mL 3  . oxybutynin (DITROPAN) 5 MG tablet Take 1 tablet (5 mg total) by mouth 4 (four) times daily. 120 tablet 0  . oxyCODONE-acetaminophen (PERCOCET) 10-325 MG tablet Take 1 tablet by mouth every 6 (six) hours as needed for pain.     . OXYGEN Inhale 3 L into the lungs continuous.     . polyethylene glycol (MIRALAX / GLYCOLAX) 17 g packet Take 17 g by mouth daily as  needed. 14 each 0  . potassium chloride SA (K-DUR) 20 MEQ tablet Take 3 tablets (60 mEq total) by mouth 2 (two) times daily. With additional 57mq when you take Metolazone. (Patient taking differently: Take 40 mEq by mouth 2 (two) times daily. ) 90 tablet 3  . PRESCRIPTION MEDICATION Cpap    . rosuvastatin (CRESTOR) 20 MG tablet Take 20 mg by mouth at bedtime.    . senna-docusate (SENOKOT-S) 8.6-50 MG tablet Take 1 tablet by mouth at bedtime as needed for mild constipation. 30 tablet 0  . sodium chloride (OCEAN) 0.65 % SOLN nasal spray Place 2 sprays into both nostrils as needed for congestion.     . torsemide (DEMADEX) 20 MG tablet Take 4 tablets (80 mg total) by mouth 2 (two) times daily. 150 tablet 3   No current facility-administered medications for this encounter.    Allergies:   Bidil [isosorb dinitrate-hydralazine]   Social History:  The patient  reports that he quit smoking about 17 months ago. His smoking use included cigarettes. He has a 34.00 pack-year smoking history. He has never used smokeless tobacco.  He reports that he does not drink alcohol and does not use drugs.   Family History:  The patient's family history includes Heart disease in his father and mother; Heart failure in his mother; Hypertension in his mother and sister.   ROS:  Please see the history of present illness.   All other systems are personally reviewed and negative.  Wt Readings from Last 3 Encounters:  05/28/20 (!) 112 kg (247 lb)  04/16/20 110.2 kg (243 lb)  03/14/20 113.4 kg (250 lb)   Vitals:   05/28/20 1504  BP: (!) 126/50  Pulse: 74  SpO2: 98%    Exam:   General:  Arrived in a wheel chair. No resp difficulty on 3 liters [place]  HEENT: normal Neck: supple. no JVD. Carotids 2+ bilat; no bruits. No lymphadenopathy or thryomegaly appreciated. Cor: PMI nondisplaced. Regular rate & rhythm. No rubs, gallops or murmurs. Lungs: clear Abdomen: obese, soft, nontender, nondistended. No hepatosplenomegaly. No bruits or masses. Good bowel sounds. Extremities: no cyanosis, clubbing, rash, edema Neuro: alert & orientedx3, cranial nerves grossly intact. moves all 4 extremities w/o difficulty. Affect pleasant  Recent Labs: 08/29/2019: Magnesium 2.0 12/05/2019: B Natriuretic Peptide 1,175.8 02/10/2020: ALT 45; Hemoglobin 10.1; Platelets 522; TSH 1.487 02/17/2020: BUN 67; Creatinine, Ser 3.52; Potassium 4.5; Sodium 139  Personally reviewed   Wt Readings from Last 3 Encounters:  05/28/20 (!) 112 kg (247 lb)  04/16/20 110.2 kg (243 lb)  03/14/20 113.4 kg (250 lb)      ASSESSMENT AND PLAN:  1. Chronic systolic => diastolic CHF: With prominent RV failure, likely related to COPD/OSA/OHS. TEE in 3/20 with EF 40-45%, severely dilated RV with moderately decreased systolic function. However, repeat echo in 9/20 showed EF >65% with moderate RV dilation/mildly decreased RV systolic function, and echo in 10/20 showed EF 60-65% with severe RV dilation/severe RV dysfunction. He has had multiple admissions with CHF and COPD exacerbations.  CHF is complicated by CKD.   - NYHA III. Volume status stable. Continue torsemide 80 mg bid.  No metolazone  2. Atrial fibrillation: Paroxysmal. He has history of CVA.  He had DCCV in 3/20 and again in 5/20. He failed amiodarone in the past and cannot take Tikosyn due to baseline prolonged QT interval.  However, he was restarted on amiodarone at 10/20 admission and has been in NSR since then.   -Regular on exam.  -  Continue amiodarone 100 mg daily  - Continue apixaban 2.5 mg bid. - Nephrology cut dose back. I will check with pharmacy regarding dose.  - Poor ablation candidate with LA size - Continue amiodarone 100 mg daily.   - Needs yearly eye exams  3. COPD: No longer smokes. Continue home oxygen. CT chest showed emphysema.  4. OHS/OSA: Continue 3 L oxygen during the day and CPAP at night.    5. CAD: Nonobstructive disease in 2/20. No chest pain.  - Continue crestor 20 mg daily. - He is on apixaban so no ASA.  6. CKD: Stage IV. He is followed by [facility]. We will request copy of his lab work  7. Muscle Weakness Ordered bed side commode from [facility],   Follow up in 3 months with Dr [name]   Signed, Darrick Grinder, NP  05/28/2020  [place] [address] [facility] [place] [address] [phone] (office) [phone] (fax)

## 2020-05-28 ENCOUNTER — Other Ambulatory Visit: Payer: Self-pay

## 2020-05-28 ENCOUNTER — Telehealth (HOSPITAL_COMMUNITY): Payer: Self-pay | Admitting: Licensed Clinical Social Worker

## 2020-05-28 ENCOUNTER — Ambulatory Visit (HOSPITAL_COMMUNITY)
Admission: RE | Admit: 2020-05-28 | Discharge: 2020-05-28 | Disposition: A | Discharge status: Home / Self Care | Payer: Medicare HMO | Source: Ambulatory Visit | Attending: Cardiology | Admitting: Cardiology

## 2020-05-28 VITALS — BP 126/50 | HR 74 | Wt 247.0 lb

## 2020-05-28 DIAGNOSIS — M6281 Muscle weakness (generalized): Secondary | ICD-10-CM | POA: Diagnosis not present

## 2020-05-28 DIAGNOSIS — Z8674 Personal history of sudden cardiac arrest: Secondary | ICD-10-CM | POA: Diagnosis not present

## 2020-05-28 DIAGNOSIS — Z87891 Personal history of nicotine dependence: Secondary | ICD-10-CM | POA: Insufficient documentation

## 2020-05-28 DIAGNOSIS — I130 Hypertensive heart and chronic kidney disease with heart failure and stage 1 through stage 4 chronic kidney disease, or unspecified chronic kidney disease: Secondary | ICD-10-CM | POA: Insufficient documentation

## 2020-05-28 DIAGNOSIS — E662 Morbid (severe) obesity with alveolar hypoventilation: Secondary | ICD-10-CM | POA: Diagnosis not present

## 2020-05-28 DIAGNOSIS — J449 Chronic obstructive pulmonary disease, unspecified: Secondary | ICD-10-CM

## 2020-05-28 DIAGNOSIS — E1122 Type 2 diabetes mellitus with diabetic chronic kidney disease: Secondary | ICD-10-CM | POA: Diagnosis not present

## 2020-05-28 DIAGNOSIS — N184 Chronic kidney disease, stage 4 (severe): Secondary | ICD-10-CM | POA: Insufficient documentation

## 2020-05-28 DIAGNOSIS — Z9981 Dependence on supplemental oxygen: Secondary | ICD-10-CM | POA: Diagnosis not present

## 2020-05-28 DIAGNOSIS — Z8249 Family history of ischemic heart disease and other diseases of the circulatory system: Secondary | ICD-10-CM | POA: Insufficient documentation

## 2020-05-28 DIAGNOSIS — Z9989 Dependence on other enabling machines and devices: Secondary | ICD-10-CM | POA: Insufficient documentation

## 2020-05-28 DIAGNOSIS — I428 Other cardiomyopathies: Secondary | ICD-10-CM | POA: Diagnosis not present

## 2020-05-28 DIAGNOSIS — R0600 Dyspnea, unspecified: Secondary | ICD-10-CM | POA: Diagnosis not present

## 2020-05-28 DIAGNOSIS — I5042 Chronic combined systolic (congestive) and diastolic (congestive) heart failure: Secondary | ICD-10-CM | POA: Insufficient documentation

## 2020-05-28 DIAGNOSIS — I480 Paroxysmal atrial fibrillation: Secondary | ICD-10-CM | POA: Diagnosis not present

## 2020-05-28 DIAGNOSIS — Z794 Long term (current) use of insulin: Secondary | ICD-10-CM | POA: Insufficient documentation

## 2020-05-28 DIAGNOSIS — E785 Hyperlipidemia, unspecified: Secondary | ICD-10-CM | POA: Diagnosis not present

## 2020-05-28 DIAGNOSIS — I5022 Chronic systolic (congestive) heart failure: Secondary | ICD-10-CM | POA: Diagnosis not present

## 2020-05-28 DIAGNOSIS — Z9119 Patient's noncompliance with other medical treatment and regimen: Secondary | ICD-10-CM | POA: Diagnosis not present

## 2020-05-28 DIAGNOSIS — Z79899 Other long term (current) drug therapy: Secondary | ICD-10-CM | POA: Insufficient documentation

## 2020-05-28 DIAGNOSIS — I2510 Atherosclerotic heart disease of native coronary artery without angina pectoris: Secondary | ICD-10-CM | POA: Diagnosis not present

## 2020-05-28 DIAGNOSIS — Z7951 Long term (current) use of inhaled steroids: Secondary | ICD-10-CM | POA: Diagnosis not present

## 2020-05-28 DIAGNOSIS — Z8673 Personal history of transient ischemic attack (TIA), and cerebral infarction without residual deficits: Secondary | ICD-10-CM | POA: Insufficient documentation

## 2020-05-28 DIAGNOSIS — R0609 Other forms of dyspnea: Secondary | ICD-10-CM

## 2020-05-28 NOTE — Patient Instructions (Signed)
It was great to see you today! No medication changes are needed at this time.    Your physician recommends that you schedule a follow-up appointment in: 3 months with Dr [name]  Do the following things EVERYDAY: 1) Weigh yourself in the morning before breakfast. Write it down and keep it in a log. 2) Take your medicines as prescribed 3) Eat low salt foods--Limit salt (sodium) to 2000 mg per day.  4) Stay as active as you can everyday 5) Limit all fluids for the day to less than 2 liters

## 2020-05-28 NOTE — Telephone Encounter (Signed)
Pt was discharged from paramedicine program over a month ago so CSW called to make sure he remembered his appt this afternoon so we can make sure things are going well at home.  CSW called multiple times and phone keeps ringing then cuts off- unable to leave a message  Jorge Ny, [place] Worker [facility] Desk#: [phone] Cell#: [phone]

## 2020-06-03 IMAGING — DX DG CHEST 1V PORT
1 series · 1 of 1 positions shown · non-contrast
Comparison: 12/07/2018

CLINICAL DATA: Dyspnea

EXAM:
PORTABLE CHEST 1 VIEW

[chest ap]
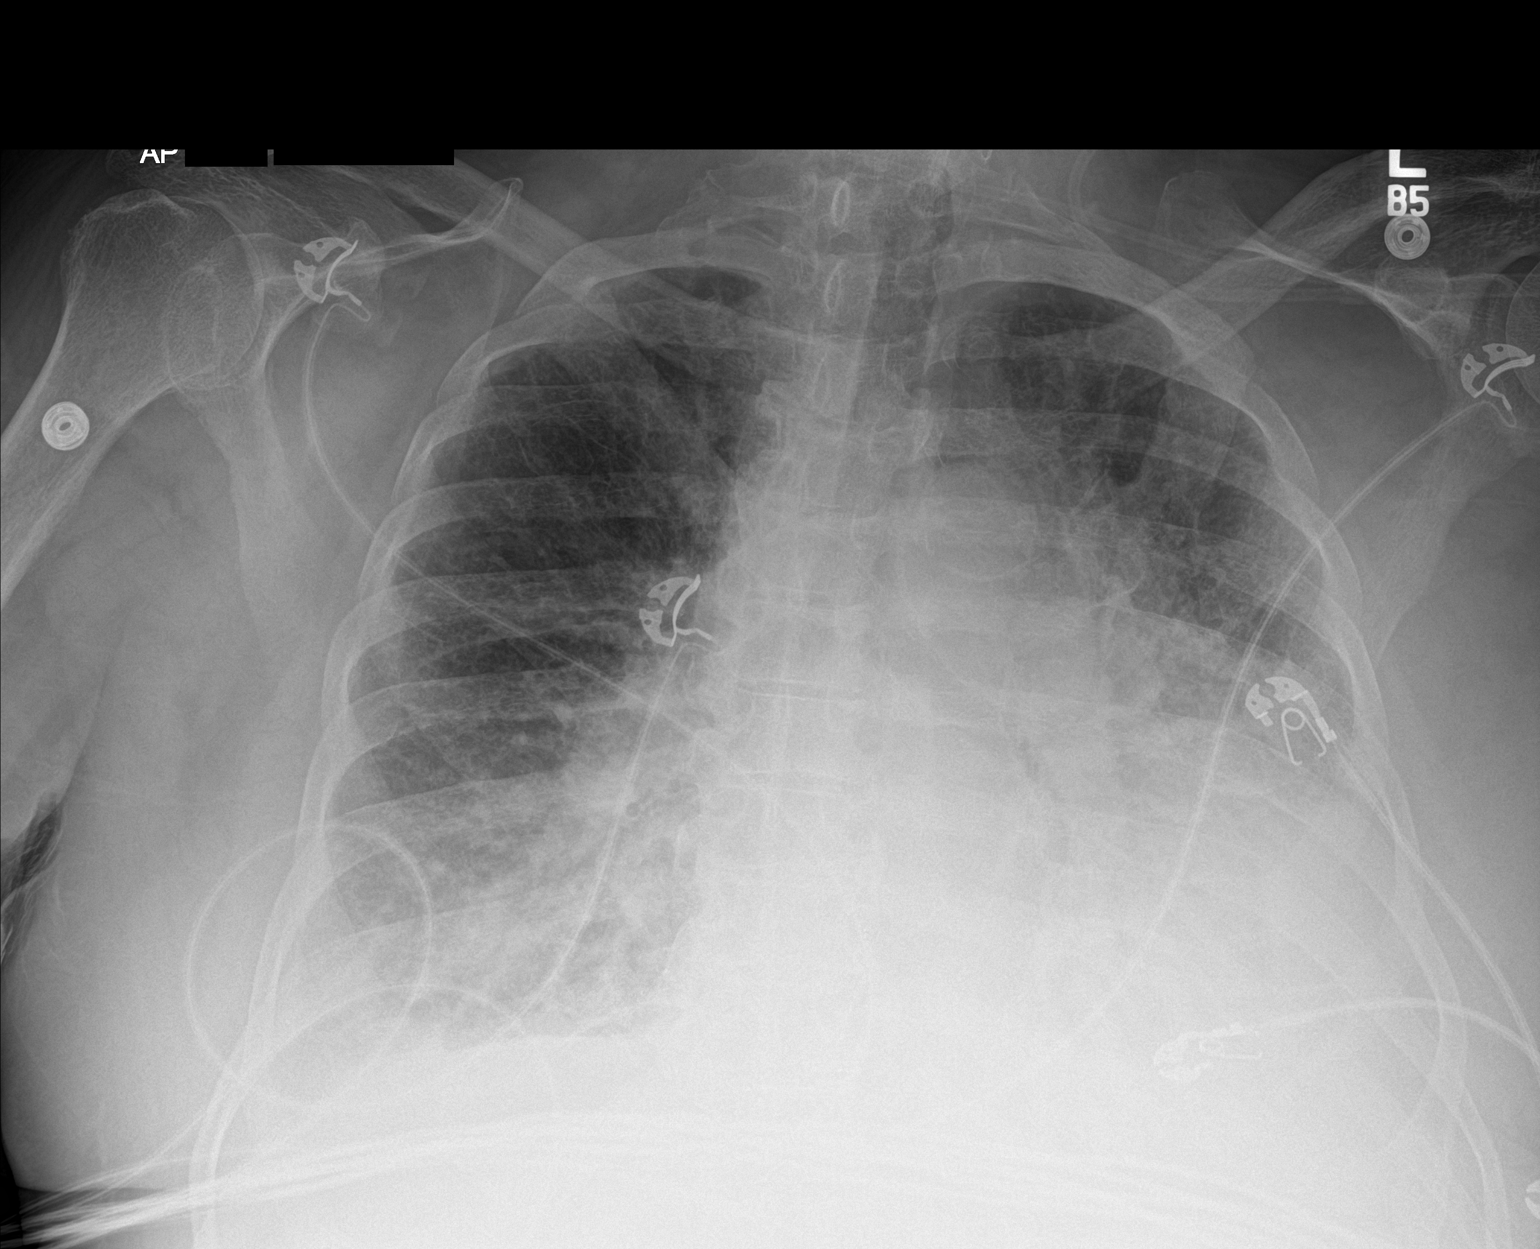

[1 of 1 positions shown; findings below may reference images not displayed]

FINDINGS: Diffuse bilateral airspace disease compatible with edema is
unchanged. Small bilateral effusions and bibasilar atelectasis
unchanged. Right-sided vascular catheter has been removed.
IMPRESSION: No interval change in congestive heart failure with edema and
bilateral effusions.

## 2020-06-04 ENCOUNTER — Telehealth (HOSPITAL_COMMUNITY): Payer: Self-pay | Admitting: Cardiology

## 2020-06-04 DIAGNOSIS — J449 Chronic obstructive pulmonary disease, unspecified: Secondary | ICD-10-CM

## 2020-06-04 DIAGNOSIS — I5022 Chronic systolic (congestive) heart failure: Secondary | ICD-10-CM

## 2020-06-04 MED ORDER — POTASSIUM CHLORIDE CRYS ER 20 MEQ PO TBCR: EXTENDED_RELEASE_TABLET | ORAL | 3 refills | Status: DC

## 2020-06-04 NOTE — Telephone Encounter (Signed)
Abnormal labs received fro [facility]  Labs drawn 04/12/20 K 3.1 Cr 2.97   Per Mare Loan, PA Increase potassium to 80 meq in the AM and 60 meq in the PM Repeat labs in one week  Patient aware and voiced understanding

## 2020-06-05 ENCOUNTER — Other Ambulatory Visit: Payer: No Typology Code available for payment source

## 2020-06-06 IMAGING — DX DG CHEST 2V
2 series · 2 of 2 positions shown · non-contrast
Comparison: 12/30/2018

CLINICAL DATA: Hypoxia

EXAM:
CHEST - 2 VIEW

[chest lat]
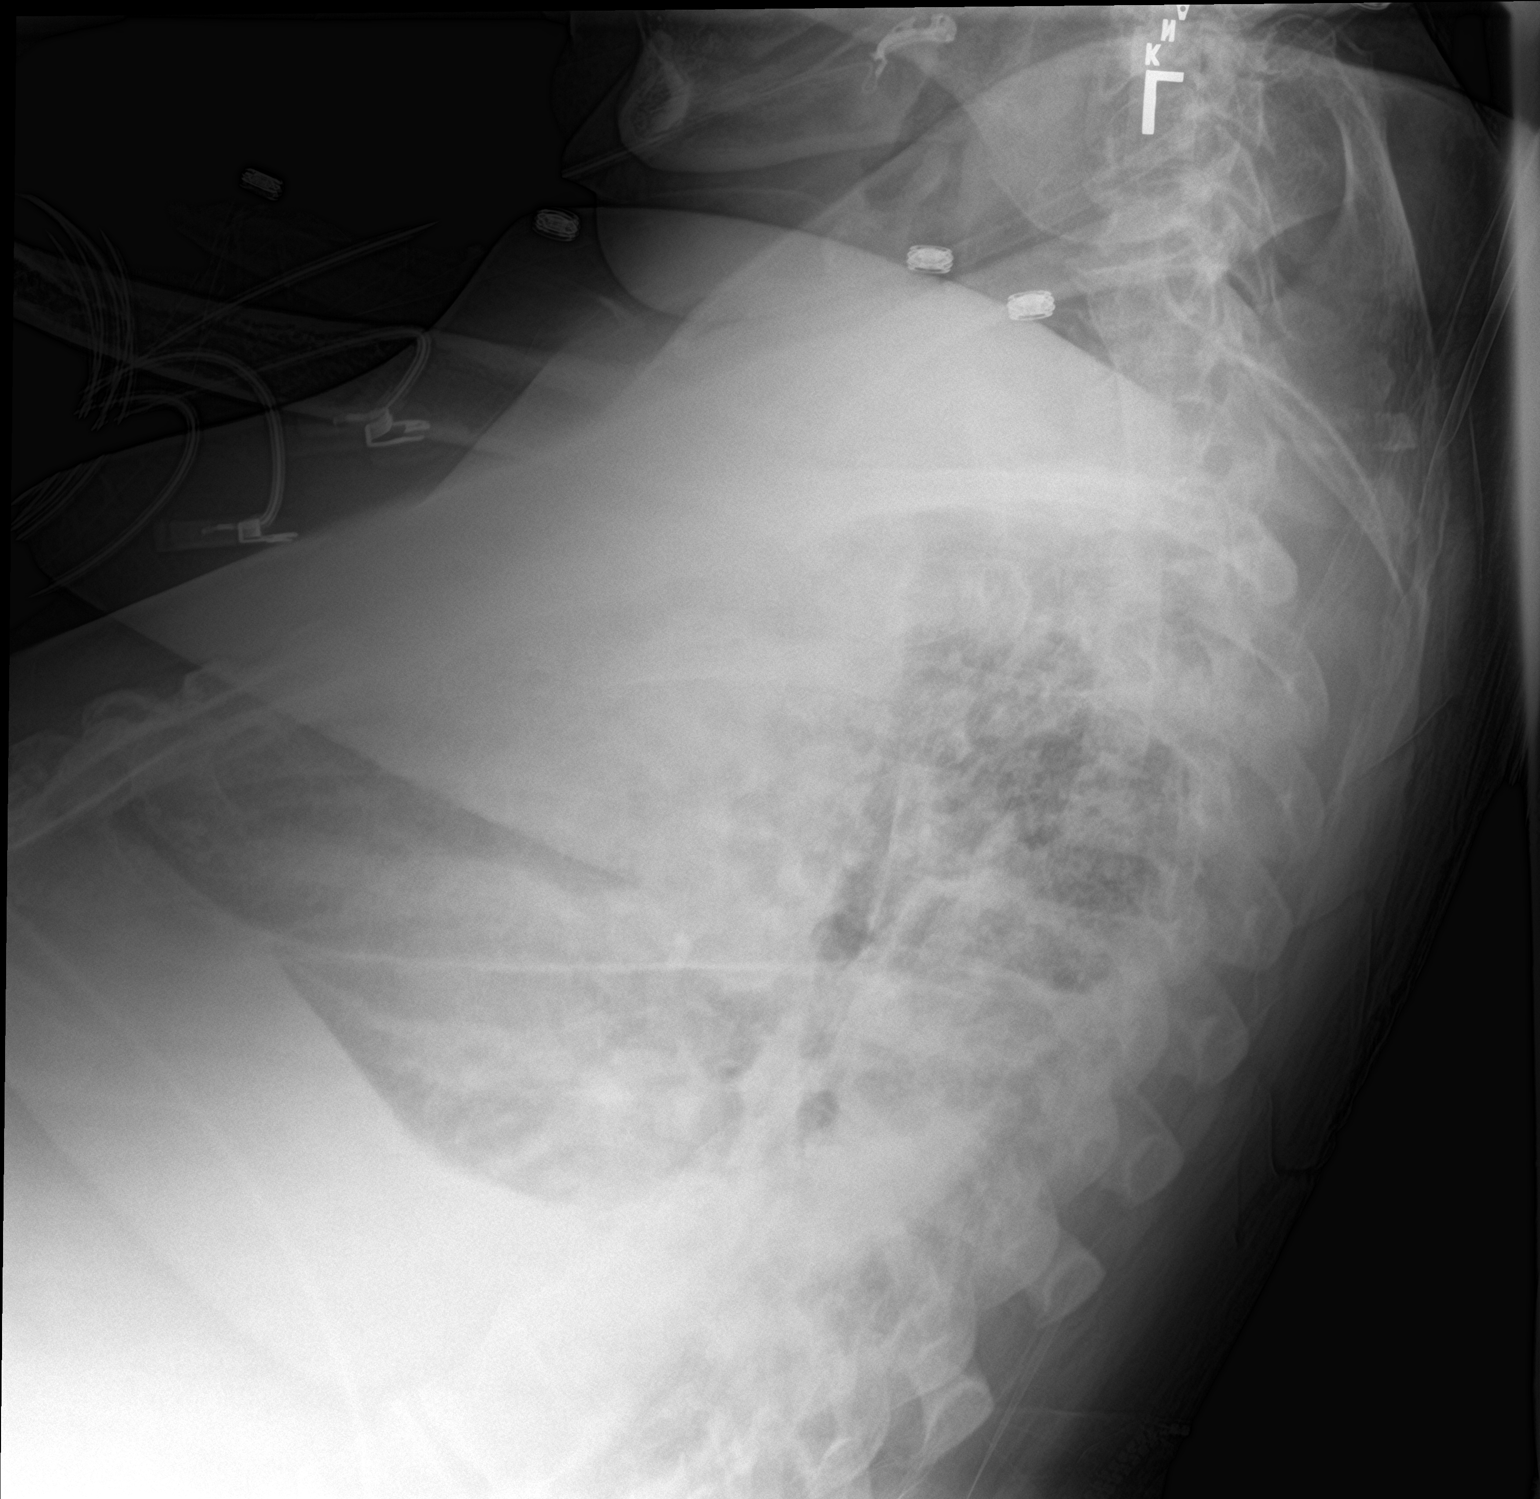

[chest ap]
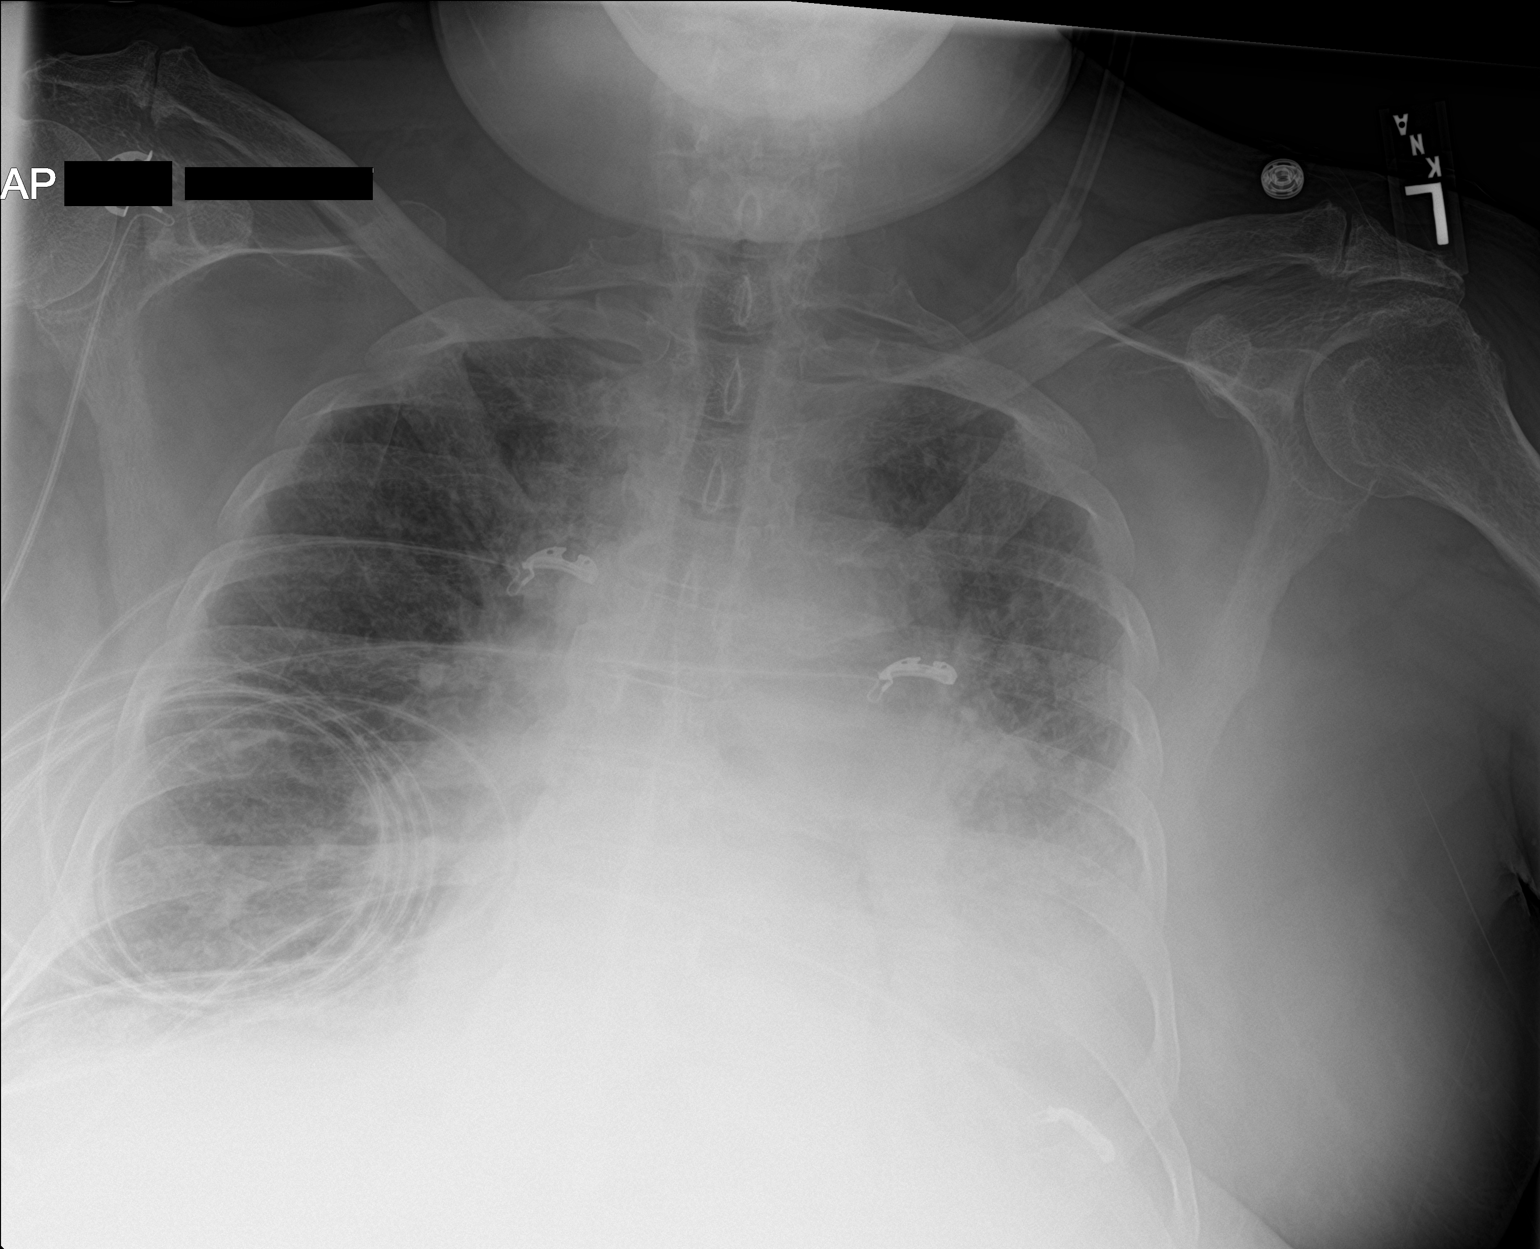

[2 of 2 positions shown; findings below may reference images not displayed]

FINDINGS: Cardiac shadow is enlarged but stable. Increased vascular congestion
is again identified. Increasing left basilar density consistent with
evolving infiltrate/atelectasis is noted. Left effusion is noted as
well. Small right-sided pleural effusion is noted.
IMPRESSION: Increasing atelectasis/infiltrate in the left base with associated
effusion.

Stable vascular congestion is noted.

## 2020-06-06 IMAGING — CT CT ABD-PELV W/ CM
2 of 5 series · 15 of 46 positions shown, 17 images · IV contrast (omnipaque)
Comparison: Renal ultrasound 12/01/2018, no prior abdominal CT.

CLINICAL DATA: Acute abdominal pain.

EXAM:
CT ABDOMEN AND PELVIS WITH CONTRAST
TECHNIQUE: Multidetector CT imaging of the abdomen and pelvis was performed
using the standard protocol following bolus administration of
intravenous contrast.
CONTRAST:  125mL OMNIPAQUE IOHEXOL 300 MG/ML  SOLN

[Series 3: abd/ pelvis 5.0 i30f 2 · axial · 0.98mm/px · z∈[+848,+1268]mm · 12 of 94 slices shown, 14 images]
[im 5/94  soft-tissue]
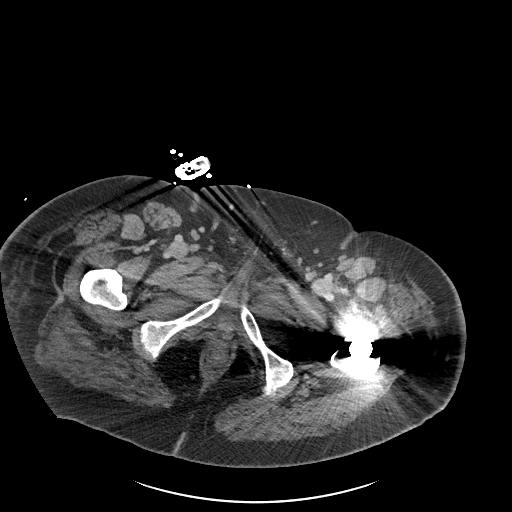
[im 5/94  bone]
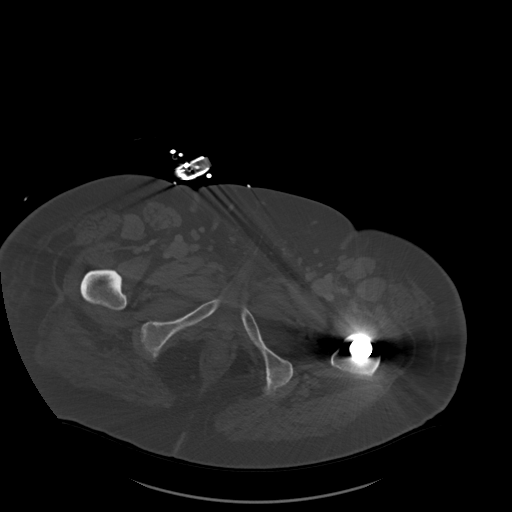
[im 15/94  soft-tissue]
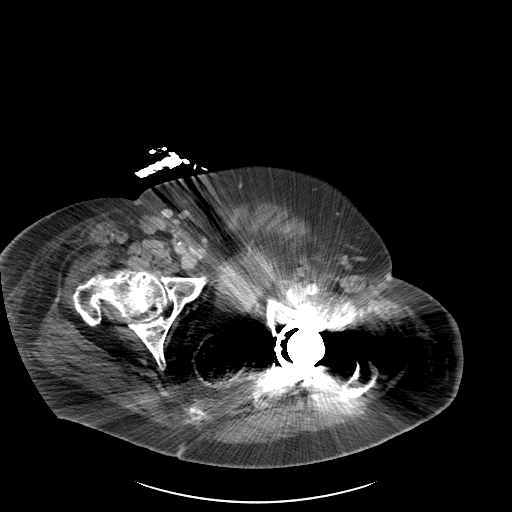
[im 20/94  soft-tissue]
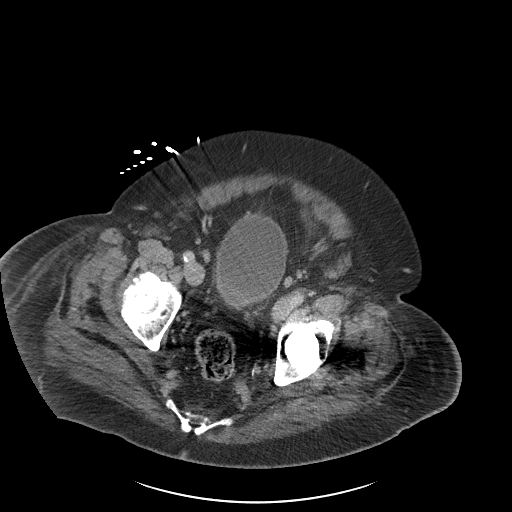
[im 30/94  soft-tissue]
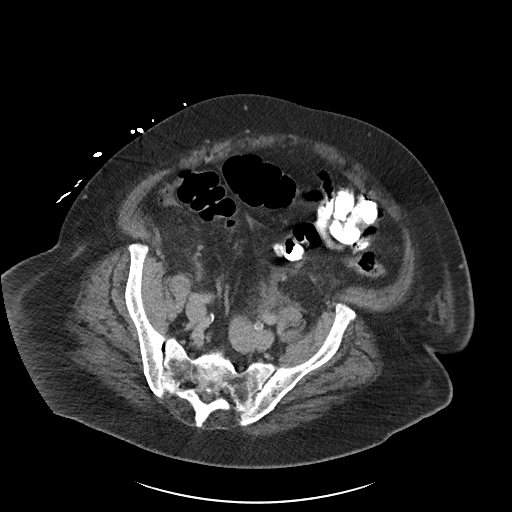
[im 35/94  soft-tissue]
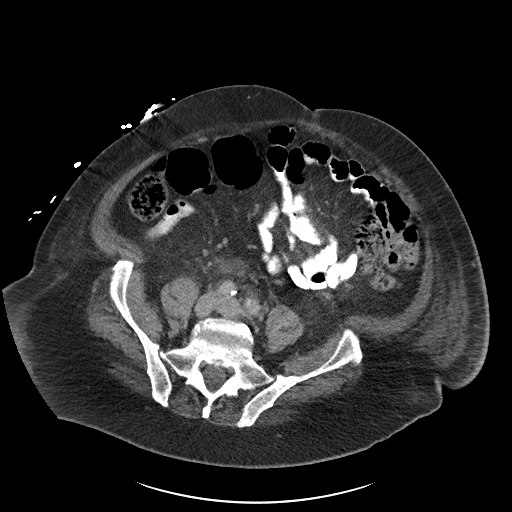
[im 45/94  soft-tissue]
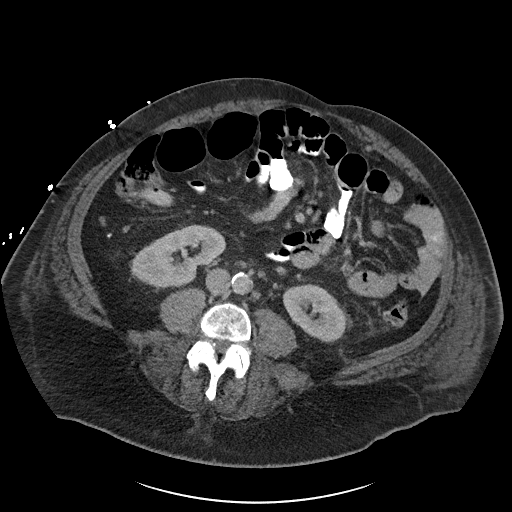
[im 49/94  soft-tissue]
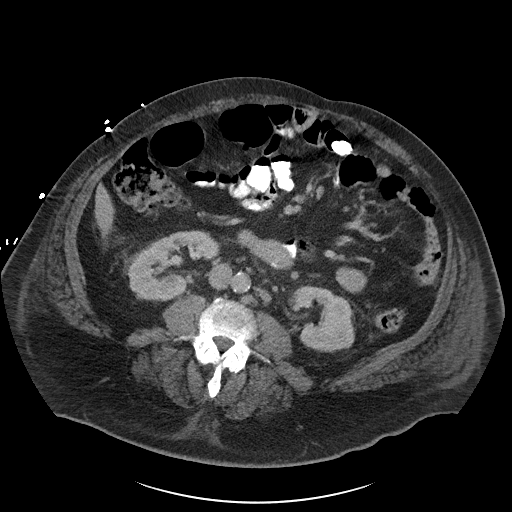
[im 59/94  soft-tissue]
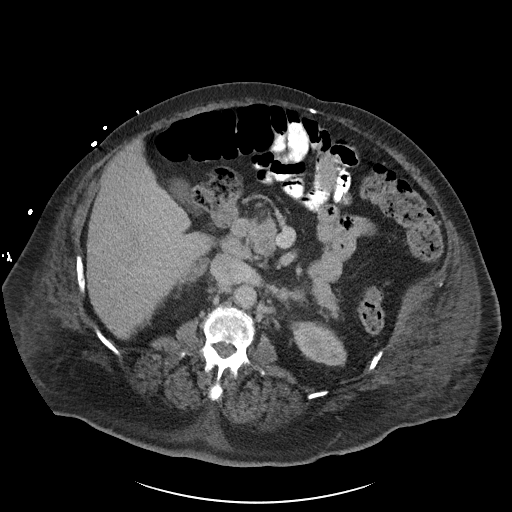
[im 64/94  soft-tissue]
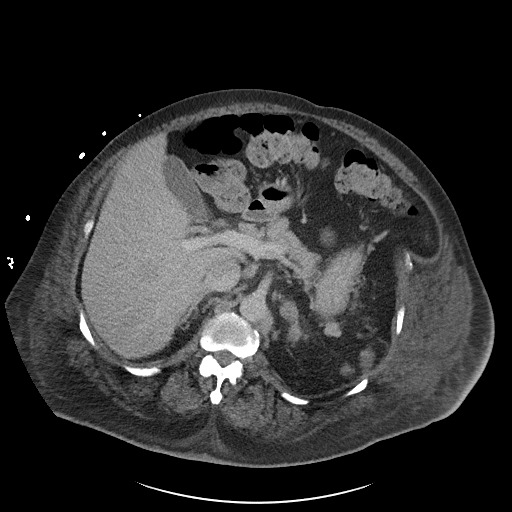
[im 64/94  bone]
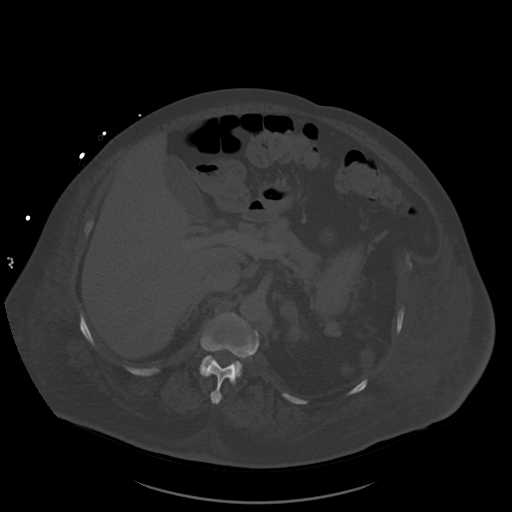
[im 74/94  soft-tissue]
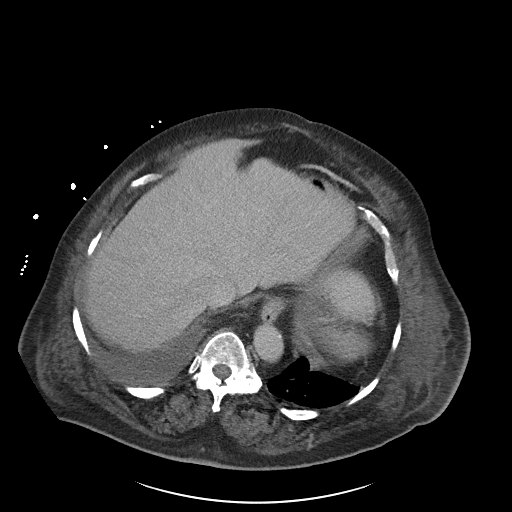
[im 79/94  soft-tissue]
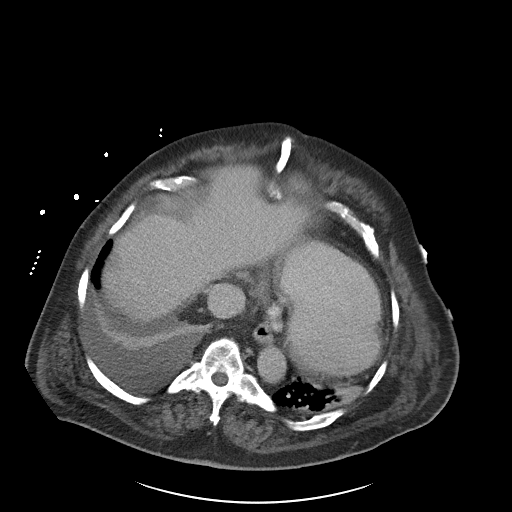
[im 89/94  soft-tissue]
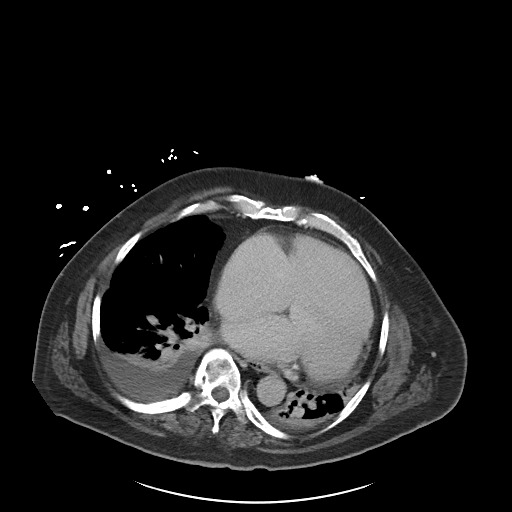

[Series 6: coronal soft tissue · coronal · 0.90mm/px · 3 of 122 slices shown]
[im 41/122  soft-tissue]
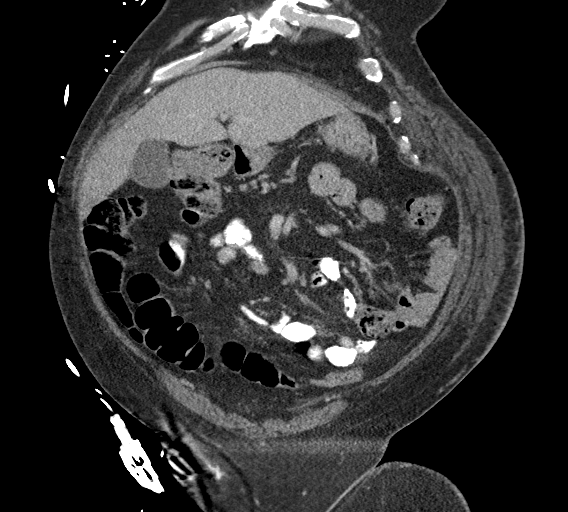
[im 54/122  soft-tissue]
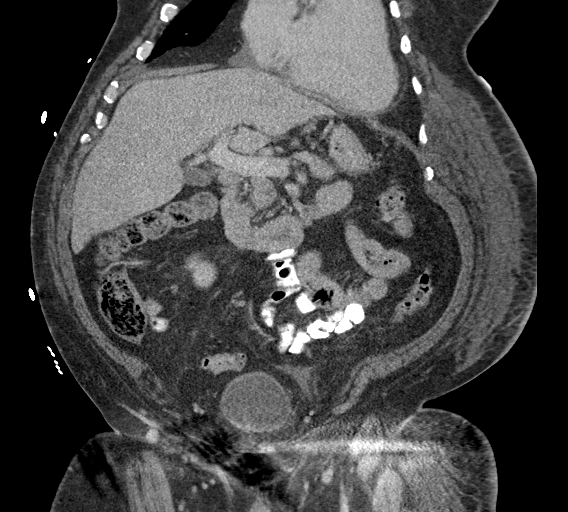
[im 68/122  soft-tissue]
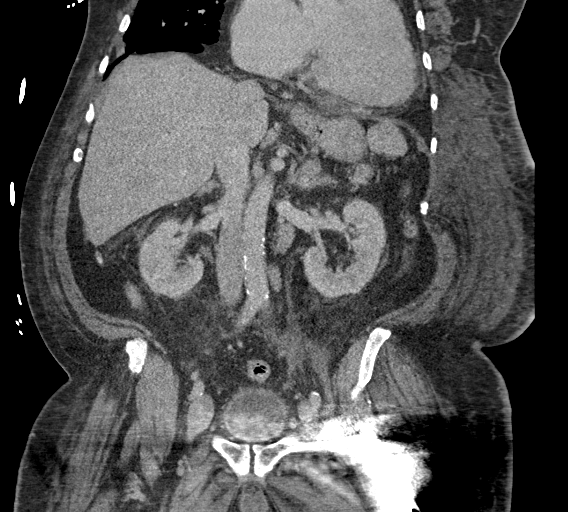

[15 of 46 positions shown; findings below may reference images not displayed]

FINDINGS: Lower chest: Small to moderate right and small left pleural
effusion. Associated compressive atelectasis in the lower lobes.
Cardiomegaly with small pericardial effusion.

Hepatobiliary: No focal hepatic abnormality. Gallbladder
physiologically distended, no calcified stone. No biliary
dilatation.

Pancreas: No ductal dilatation or inflammation.

Spleen: Post splenectomy with multifocal splenic deposits in the
left upper quadrant. Soft tissue deposits in the lesser sac may be
splenosis or lymph nodes.

Adrenals/Urinary Tract: Possible 2 cm left adrenal nodule versus
adjacent splenic deposit. The right adrenal gland appears normal. No
hydronephrosis. No visualized nephrolithiasis. Absent renal
excretion on delayed phase imaging. Urinary bladder is
physiologically distended, no bladder wall thickening.

Stomach/Bowel: Stomach is nondistended. Normal positioning of the
duodenal jejunal junction. No small bowel dilatation, obstruction or
inflammation. Mild fecalization of mid small bowel contents.
Administered enteric contrast reaches the distal small bowel. Normal
appendix. Moderate volume of stool throughout the colon. There is
significant sigmoid colonic tortuosity.

Vascular/Lymphatic: Aorto bi-iliac atherosclerosis. No aneurysm.
Prominent and mildly enlarged retroperitoneal and upper abdominal
lymph nodes, anterior caval node measures 14 mm short axis at the
level of the gallbladder. Multiple additional prominent
retroperitoneal nodes. Difficult to differentiate lymph nodes from
splenosis related to splenectomy. There are small pericardial nodes.

Reproductive: Streak artifact from left hip arthroplasty partially
obscures the prostate gland.

Other: Retroperitoneal stranding greatest in the lower
abdomen/pelvis. No frank ascites. No free air. Flank edema, left
greater than right.

Musculoskeletal: Degenerative change in the spine. Degenerative
change in the right hip with avascular necrosis, no subchondral
collapse. Left hip arthroplasty.
IMPRESSION: 1. No acute findings or explanation for abdominal pain.
2. Multiple prominent and enlarged lymph nodes in the
retroperitoneum and upper abdomen, likely reactive but nonspecific.
Additionally patient is post splenectomy with multifocal splenosis
and splenic deposits in the upper abdomen, which complicates lymph
node evaluation. There are no prior exams available for comparison
to evaluate for stability. Recommend clinical and laboratory
evaluation for lymphoproliferative disorder. The absence of prior
imaging, recommend CT follow-up in 3 months.
3. Absent renal excretion on delayed phase imaging consistent with
renal dysfunction.
4. Small to moderate right and small left pleural effusions.
Cardiomegaly with small pericardial effusion.
5.  Aortic Atherosclerosis ([id number]-GLH.H).

## 2020-06-07 ENCOUNTER — Other Ambulatory Visit (HOSPITAL_COMMUNITY): Payer: Self-pay | Admitting: Cardiology

## 2020-06-07 IMAGING — CT CT CHEST W/O CM
2 of 3 series · 15 of 36 positions shown, 18 images · non-contrast
Comparison: Abdominal CT 01/02/2019

CLINICAL DATA: 72-year-old with shortness of breath. History of
systolic and diastolic heart failure.

EXAM:
CT CHEST WITHOUT CONTRAST
TECHNIQUE: Multidetector CT imaging of the chest was performed following the
standard protocol without IV contrast.

[Series 3: chest w/o 2mm st · axial · non-contrast · 0.78mm/px · z∈[+1397,+1695]mm · 12 of 175 slices shown, 15 images]
[im 13/175  mediastinal]
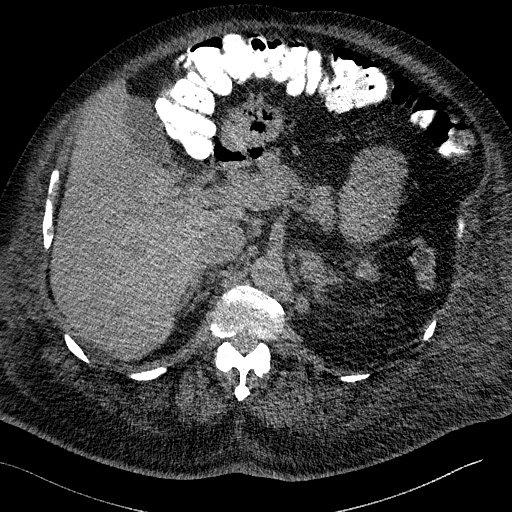
[im 13/175  lung]
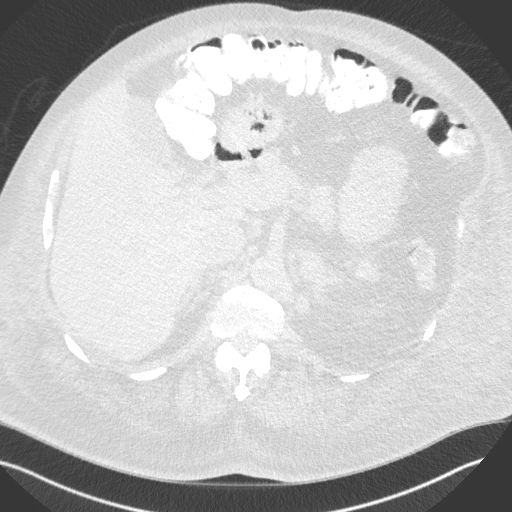
[im 26/175  lung]
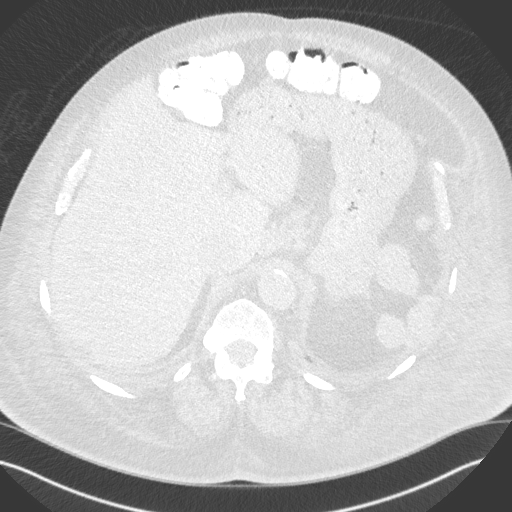
[im 39/175  lung]
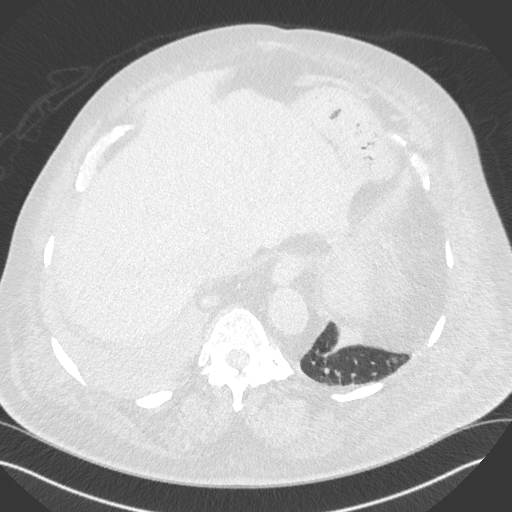
[im 52/175  lung]
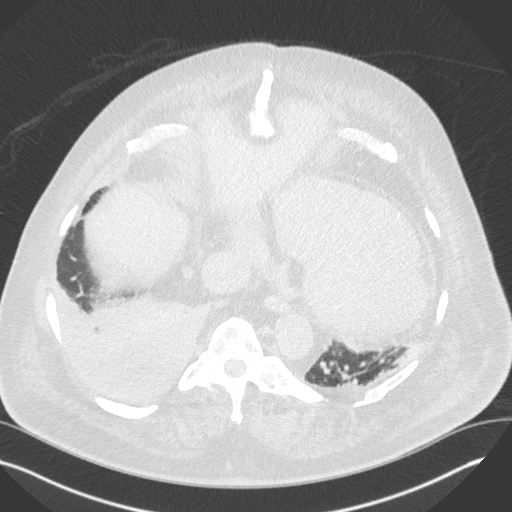
[im 65/175  mediastinal]
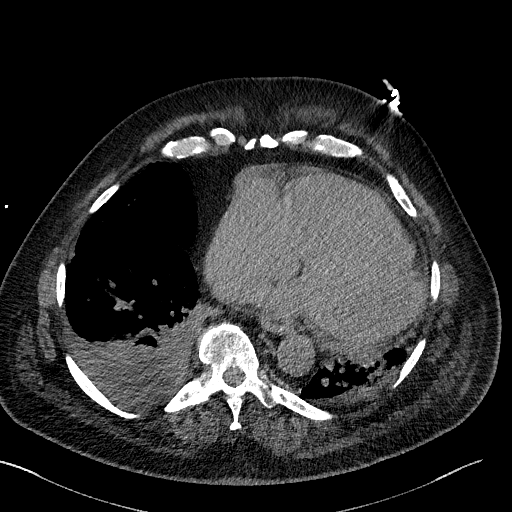
[im 65/175  lung]
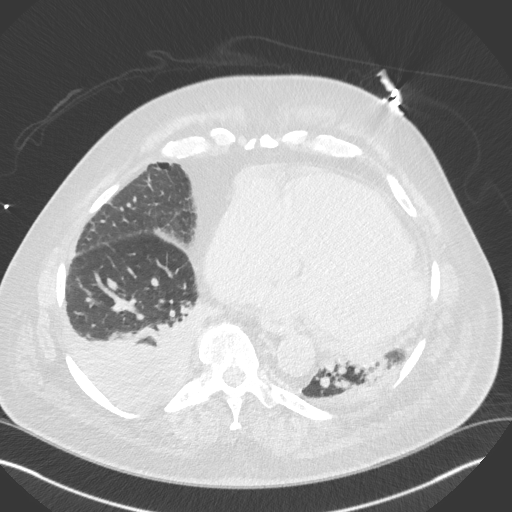
[im 78/175  lung]
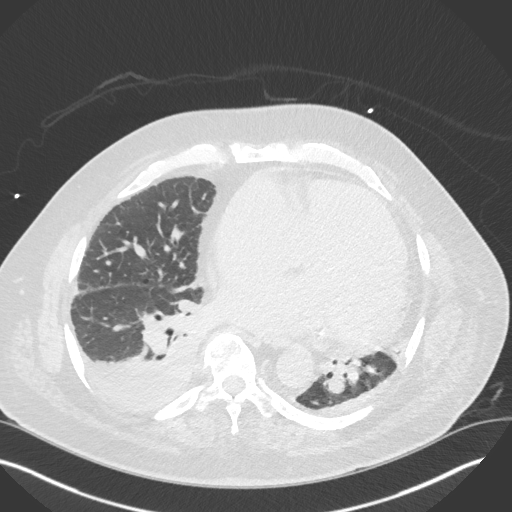
[im 97/175  lung]
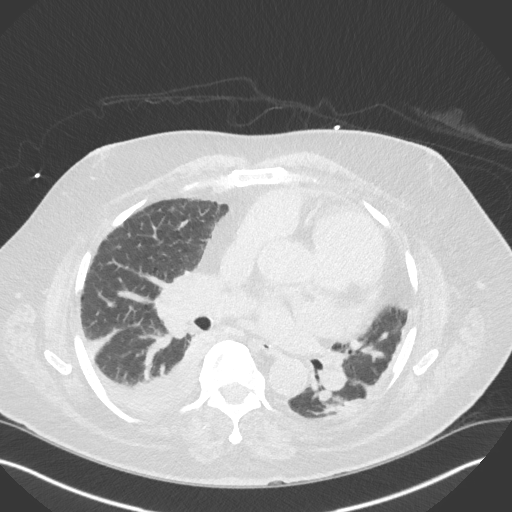
[im 110/175  lung]
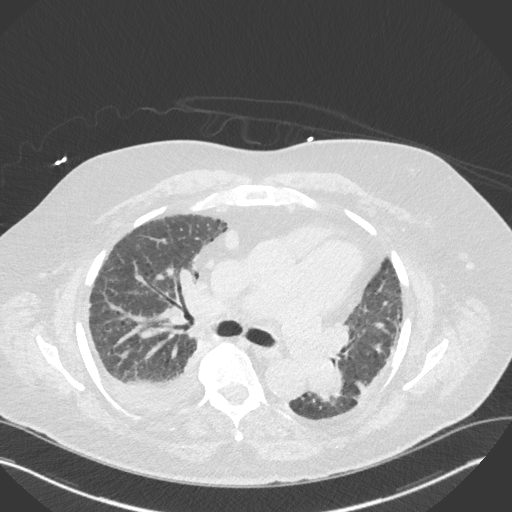
[im 123/175  mediastinal]
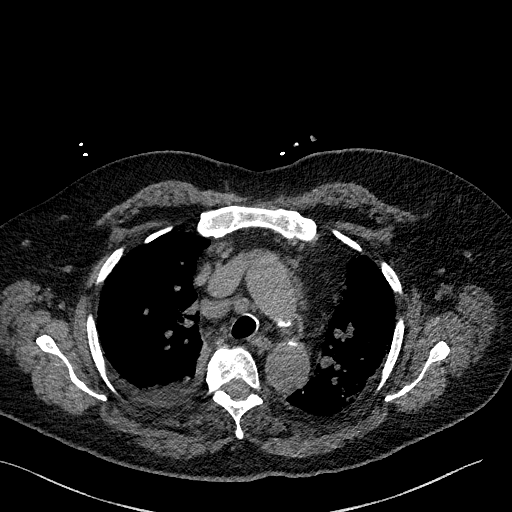
[im 123/175  lung]
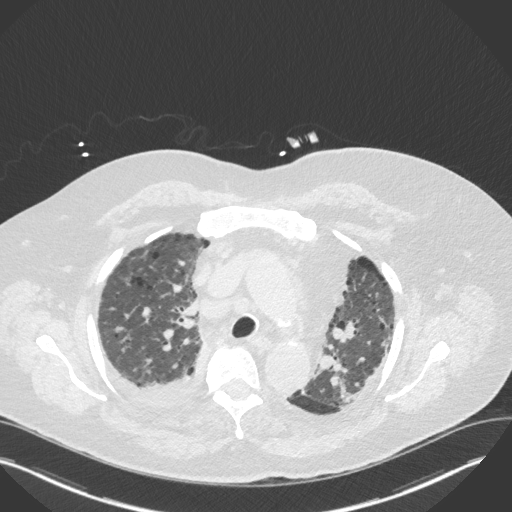
[im 136/175  lung]
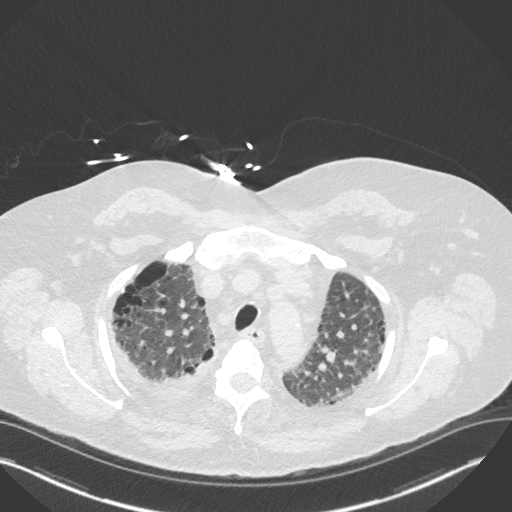
[im 149/175  lung]
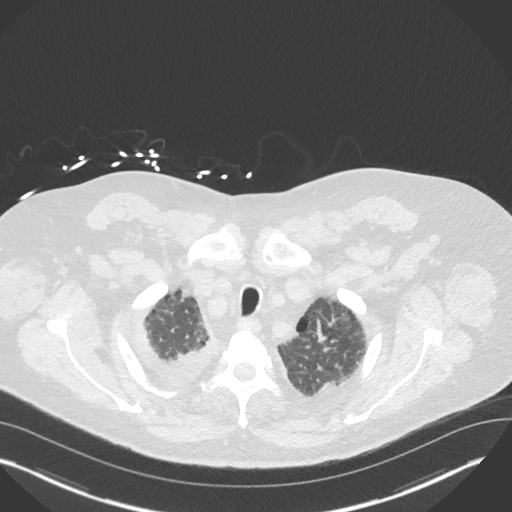
[im 162/175  lung]
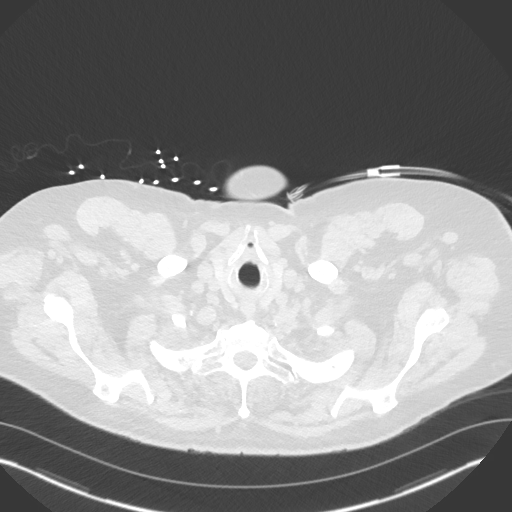

[Series 6: chest w/o 3mm st cor · coronal · non-contrast · 0.68mm/px · 3 of 126 slices shown]
[im 26/126  lung]
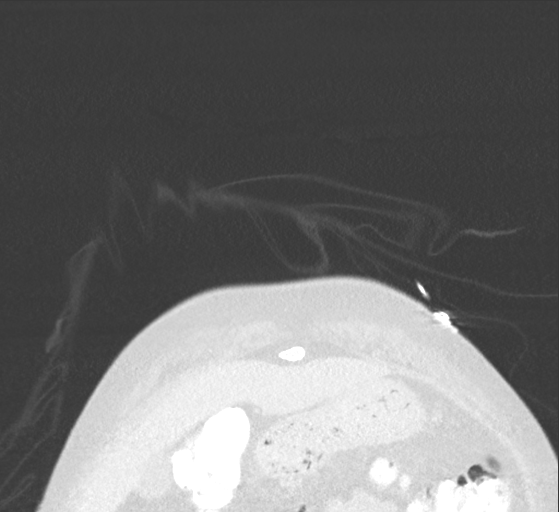
[im 51/126  lung]
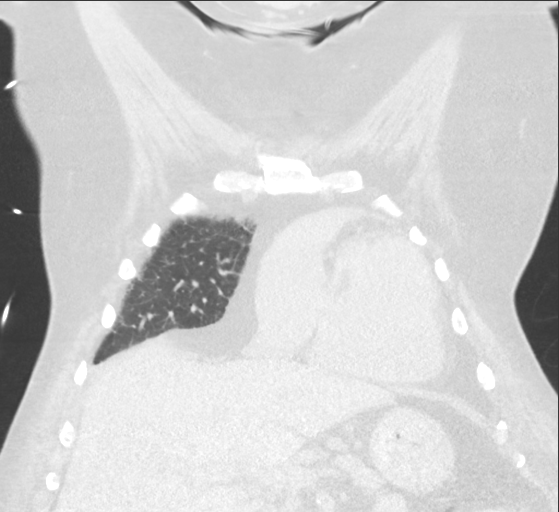
[im 76/126  lung]
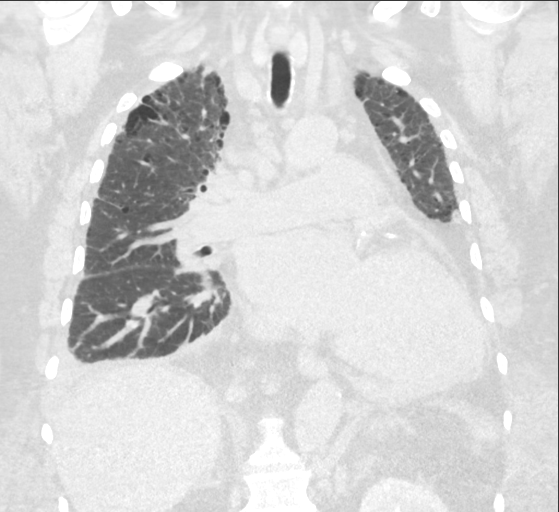

[15 of 36 positions shown; findings below may reference images not displayed]

FINDINGS: Cardiovascular: Atherosclerotic calcifications in the coronary
arteries and thoracic aorta. Heart is enlarged. Small amount of
pericardial fluid.

Mediastinum/Nodes: Large number of small lymph nodes scattered
throughout the mediastinum. Index lymph node in the pretracheal
region measures 1.3 cm in the short axis on sequence 3, image 59.
Probable small hilar lymph nodes but limited evaluation on this
noncontrast examination. No significant axillary lymph node
enlargement.

Lungs/Pleura: Trachea and mainstem bronchi are patent. Centrilobular
and paraseptal emphysema. Small right pleural effusion. Volume loss
in the right lower lobe. Mild septal thickening in the lungs. 3 mm
nodule in right upper lobe on sequence 5, image 56. 3 mm nodule near
the right minor fissure on sequence 5, image 80. Significant volume
loss in the left lower lobe. 7 x 6 mm nodule near the left lung apex
on sequence 5 image 23. 6 mm nodule at the left lung apex on image
31. 8 x 6 mm nodule in the left upper lobe on sequence 5 image 34.

Upper Abdomen: Again noted are scattered areas of soft tissue in the
left upper quadrant probably representing accessory spleens
following the splenectomy. Again noted is thickening in the left
adrenal gland with Hounsfield units measuring roughly 32 and this
cannot be considered a benign adenoma based on these imaging
findings. Again noted are small lymph nodes in the upper abdomen.
Images of the upper abdomen are limited due to motion.

Musculoskeletal: Bridging osteophytes in thoracic spine. No acute
bone abnormality.
IMPRESSION: 1. Small right pleural effusion with compressive atelectasis in the
right lower lobe.
2. Volume loss in left lower lobe.
3. Several small pulmonary nodules. Largest pulmonary nodule
measures 8 mm in the left upper lobe. Nodules could be inflammatory
but indeterminate. Non-contrast chest CT at 3-6 months is
recommended. If the nodules are stable at time of repeat CT, then
future CT at 18-24 months (from today's scan) is considered optional
for low-risk patients, but is recommended for high-risk patients.
This recommendation follows the consensus statement: Guidelines for
Management of Incidental Pulmonary Nodules Detected on CT Images:
4. Aortic Atherosclerosis (EFGP3-8HW.W) and Emphysema (EFGP3-3LA.I).
Cardiomegaly.
5. Mild septal thickening in the lungs. These findings could be
related to mild or chronic pulmonary edema.
6. Several small lymph nodes scattered throughout the chest. Lymph
nodes are nonspecific but may be reactive. Recommend attention on
follow up imaging.
7. Indeterminate left adrenal nodule/thickening.

## 2020-06-08 IMAGING — DX DG CHEST 1V PORT
1 series · 1 of 1 positions shown · non-contrast
Comparison: 01/03/2019 CT chest. 01/02/2019 chest radiograph.

CLINICAL DATA: 72 y/o  M; encounter for central line placement.

EXAM:
PORTABLE CHEST 1 VIEW

[chest]
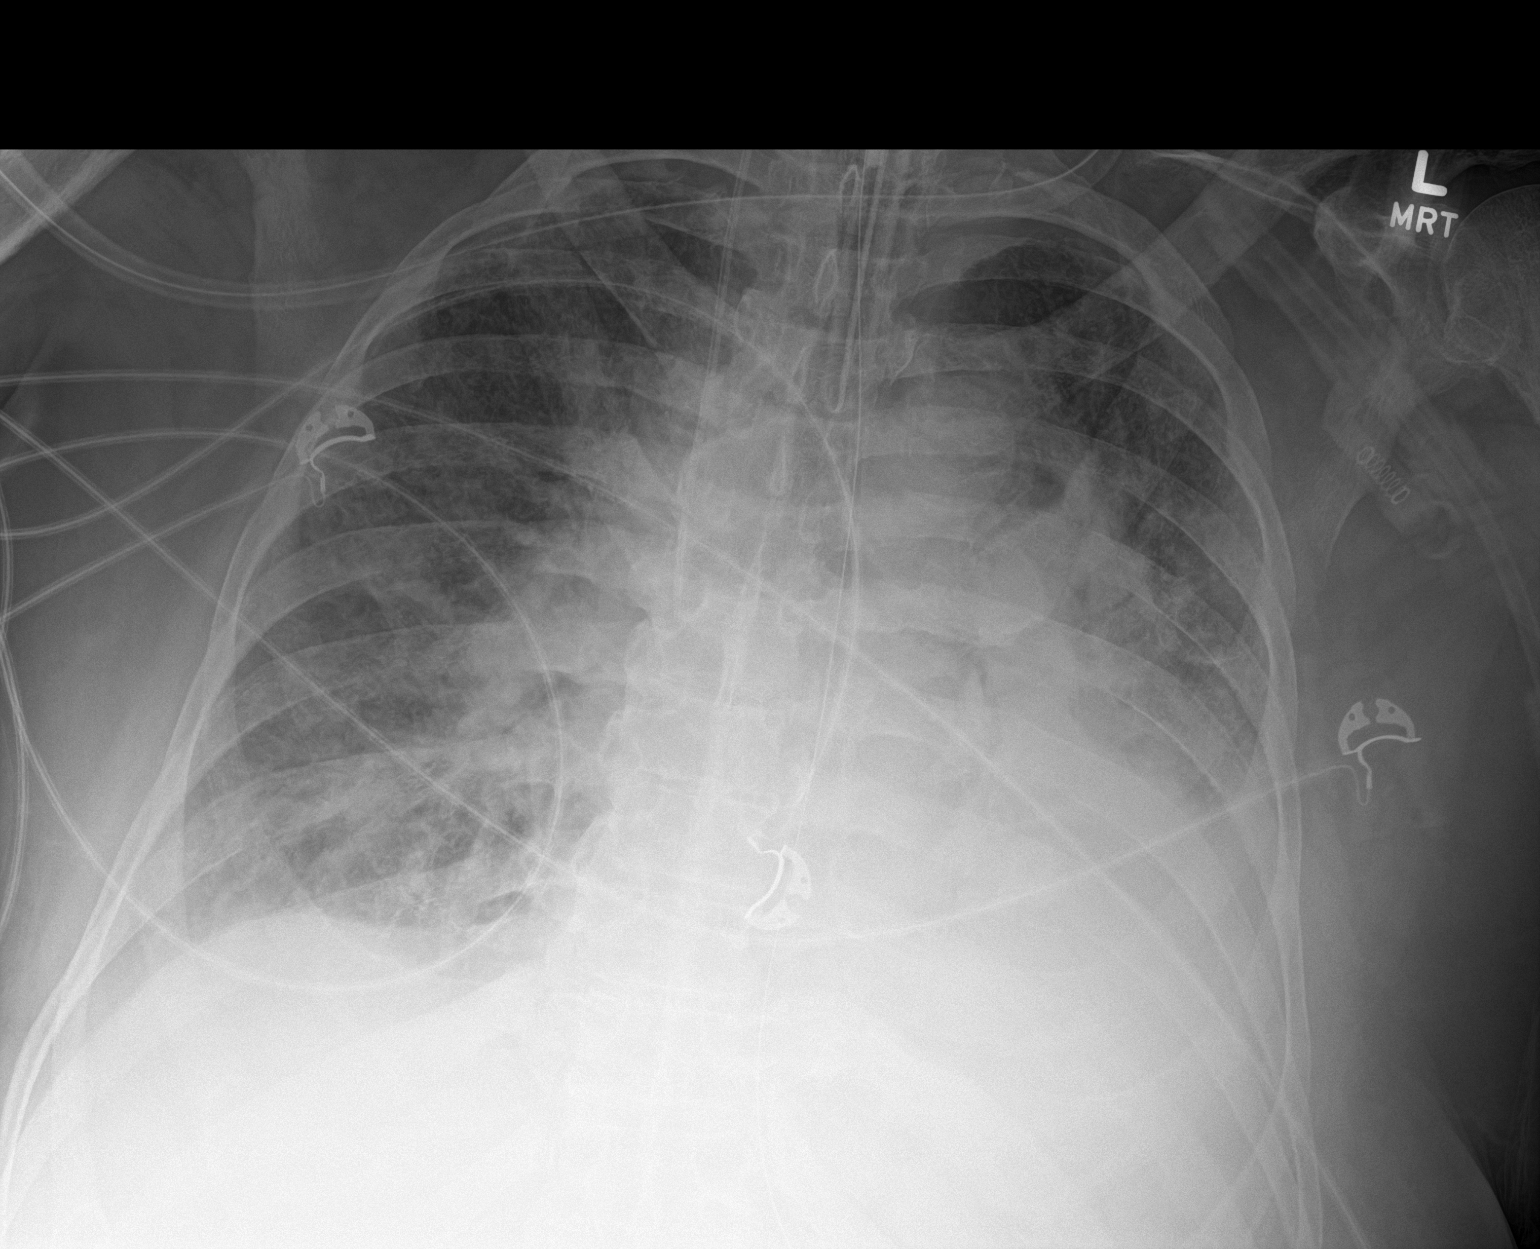

[1 of 1 positions shown; findings below may reference images not displayed]

FINDINGS: Small right and moderate left pleural effusions. Left basilar
opacity. Diffuse hazy opacities of the lungs. Findings similar to
prior chest radiograph given differences in technique. Endotracheal
tube tip projects 3.8 cm above the carina. Right central venous
catheter tip projects over mid SVC. Enteric tube extends below the
field of view into the abdomen. No pneumothorax. Aortic arch
calcific atherosclerosis. No acute osseous abnormality is evident.
Cardiomegaly, partially obscured by left basilar opacity.
IMPRESSION: 1. Right central venous catheter tip projects over mid SVC. Enteric
tube extends below the field of view into the abdomen. Enteric tube
tip projects 3.9 cm above carina.
2. Small right and moderate left pleural effusions, left basilar
consolidation, and diffuse hazy opacities of the lungs are stable
from prior chest radiograph.

## 2020-06-09 IMAGING — DX DG CHEST 1V PORT
1 series · 1 of 1 positions shown · non-contrast
Comparison: Yesterday

CLINICAL DATA: History of endotracheal tube

EXAM:
PORTABLE CHEST 1 VIEW

[chest ap]
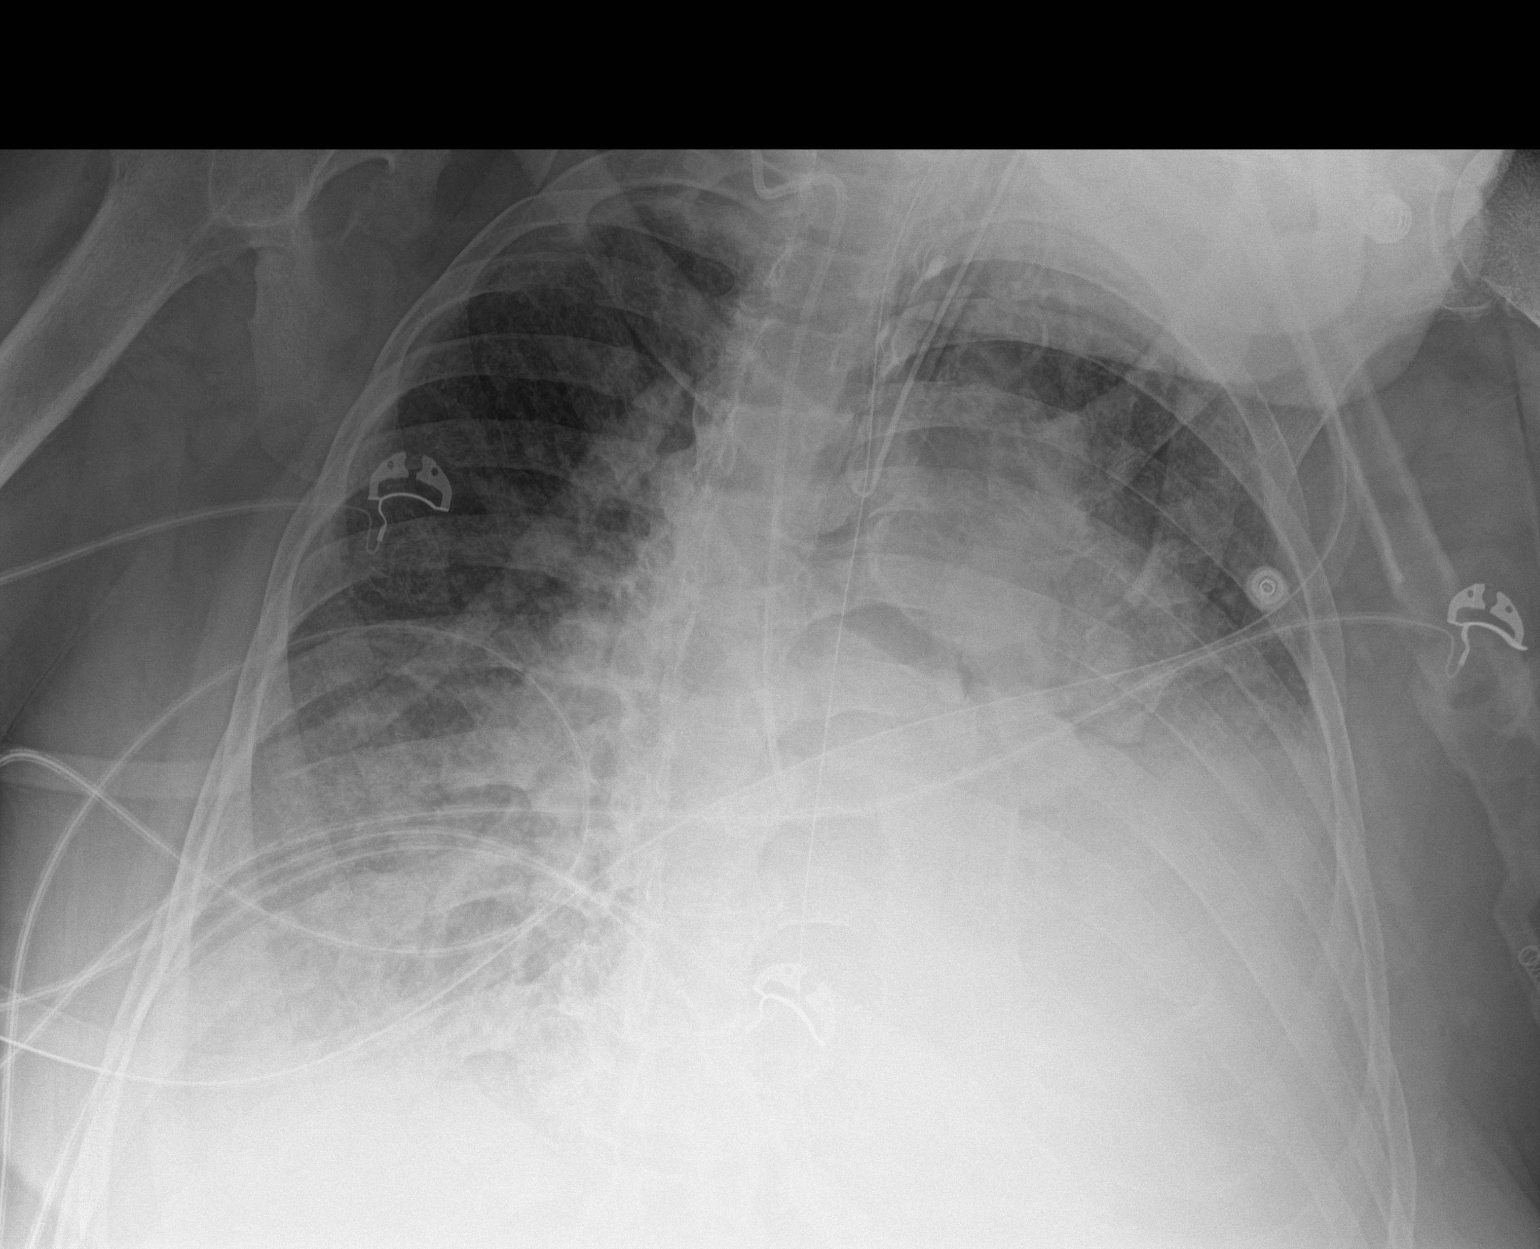

[1 of 1 positions shown; findings below may reference images not displayed]

FINDINGS: Endotracheal tube tip at the clavicular heads. The orogastric tube
at least reaches the stomach. Right IJ line with tip at the upper
cavoatrial junction region. Cardiomegaly and diffuse lung opacity
asymmetric to the left. Left diaphragm may be elevated. No
pneumothorax.
IMPRESSION: 1. Stable hardware positioning.
2. Unchanged extensive opacity with atelectasis and vascular
congestion seen on chest CT 2 days ago

## 2020-06-10 IMAGING — DX DG CHEST 1V PORT
1 series · 1 of 1 positions shown · non-contrast
Comparison: Yesterday

CLINICAL DATA: Respiratory failure

EXAM:
PORTABLE CHEST 1 VIEW

[chest]
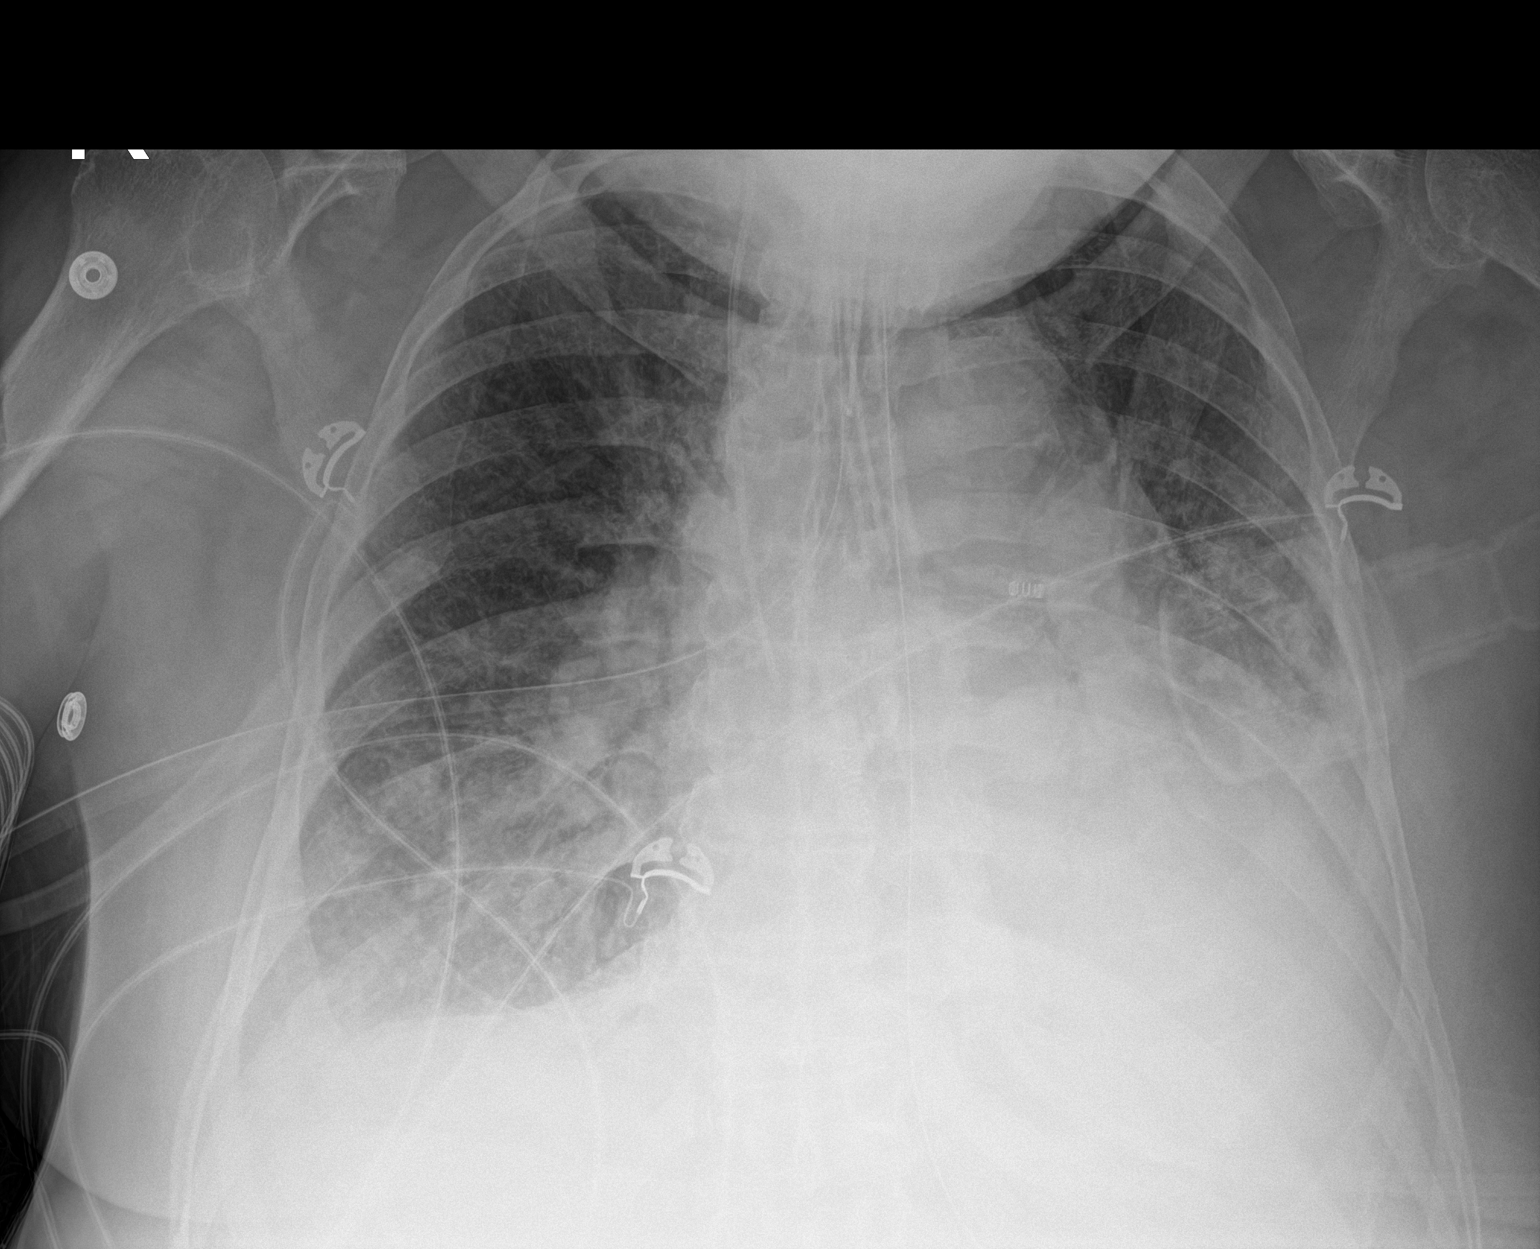

[1 of 1 positions shown; findings below may reference images not displayed]

FINDINGS: The internal and external portions of the endotracheal tube overlap.
Based on tubing, the internal tip is at the level of the clavicular
heads, stable. The orogastric tube at least reaches the stomach.
Unremarkable right IJ line. Haziness of the bilateral lower chest,
stable and characterized by CT 3 days ago. Cardiomegaly.
IMPRESSION: 1. Stable hardware positioning.
2. Unchanged lung opacity and layering pleural fluid.

## 2020-06-11 IMAGING — DX DG CHEST 1V PORT
1 series · 1 of 1 positions shown · non-contrast
Comparison: 01/06/2019

CLINICAL DATA: Respiratory failure

EXAM:
PORTABLE CHEST 1 VIEW

[chest]
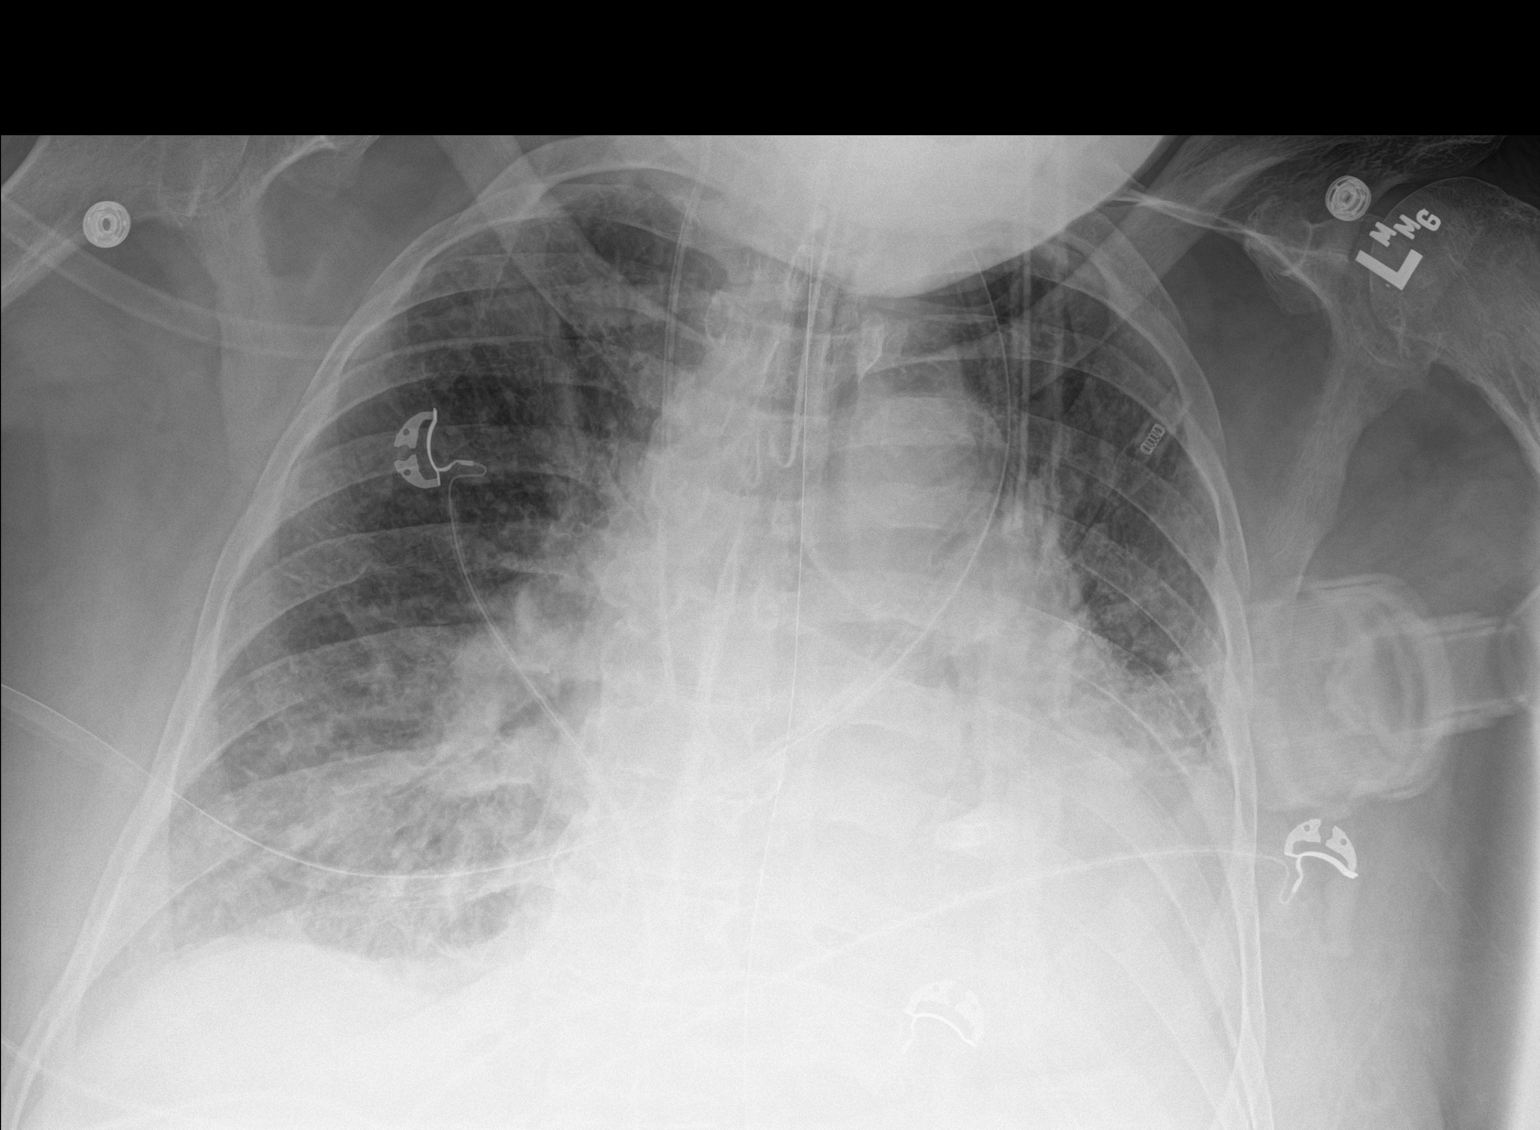

[1 of 1 positions shown; findings below may reference images not displayed]

FINDINGS: Support devices are stable. Cardiomegaly with vascular congestion,
bilateral perihilar and lower lobe airspace opacities. Possible left
effusion. No acute bony abnormality.
IMPRESSION: No significant change since prior study.

## 2020-06-12 ENCOUNTER — Other Ambulatory Visit (HOSPITAL_COMMUNITY): Payer: No Typology Code available for payment source

## 2020-06-12 ENCOUNTER — Other Ambulatory Visit: Payer: No Typology Code available for payment source

## 2020-06-12 IMAGING — DX DG CHEST 1V PORT
1 series · 1 of 1 positions shown · non-contrast
Comparison: 01/07/2019

CLINICAL DATA: Respiratory failure

EXAM:
PORTABLE CHEST 1 VIEW

[chest]
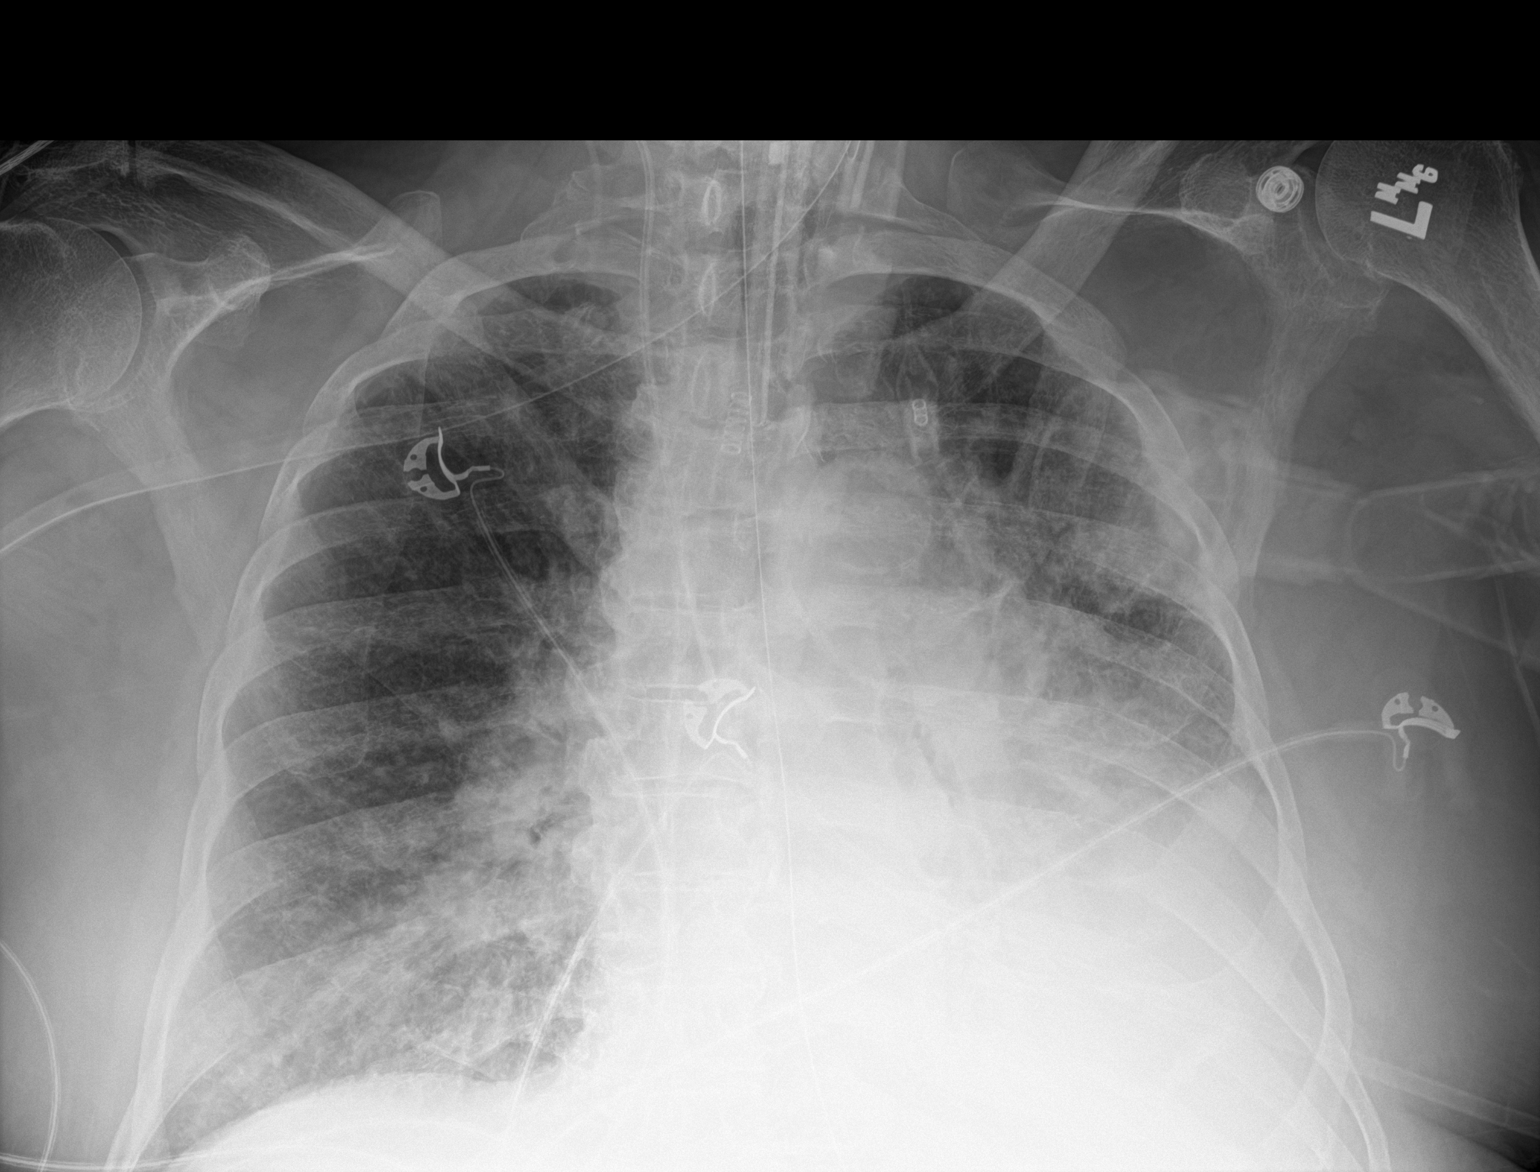

[1 of 1 positions shown; findings below may reference images not displayed]

FINDINGS: Cardiac shadow is stable. Endotracheal tube, gastric catheter and
right jugular central line are again seen and stable. The lungs are
well aerated bilaterally. Persistent left basilar opacity with
associated effusion is seen. Mild right basilar changes have
improved somewhat in the interval from the prior exam.
IMPRESSION: Persistent left basilar opacity with associated effusion.

## 2020-06-13 IMAGING — DX DG CHEST 1V PORT
1 series · 1 of 1 positions shown · non-contrast
Comparison: 01/08/2019

CLINICAL DATA: Respiratory failure

EXAM:
PORTABLE CHEST 1 VIEW

[chest]
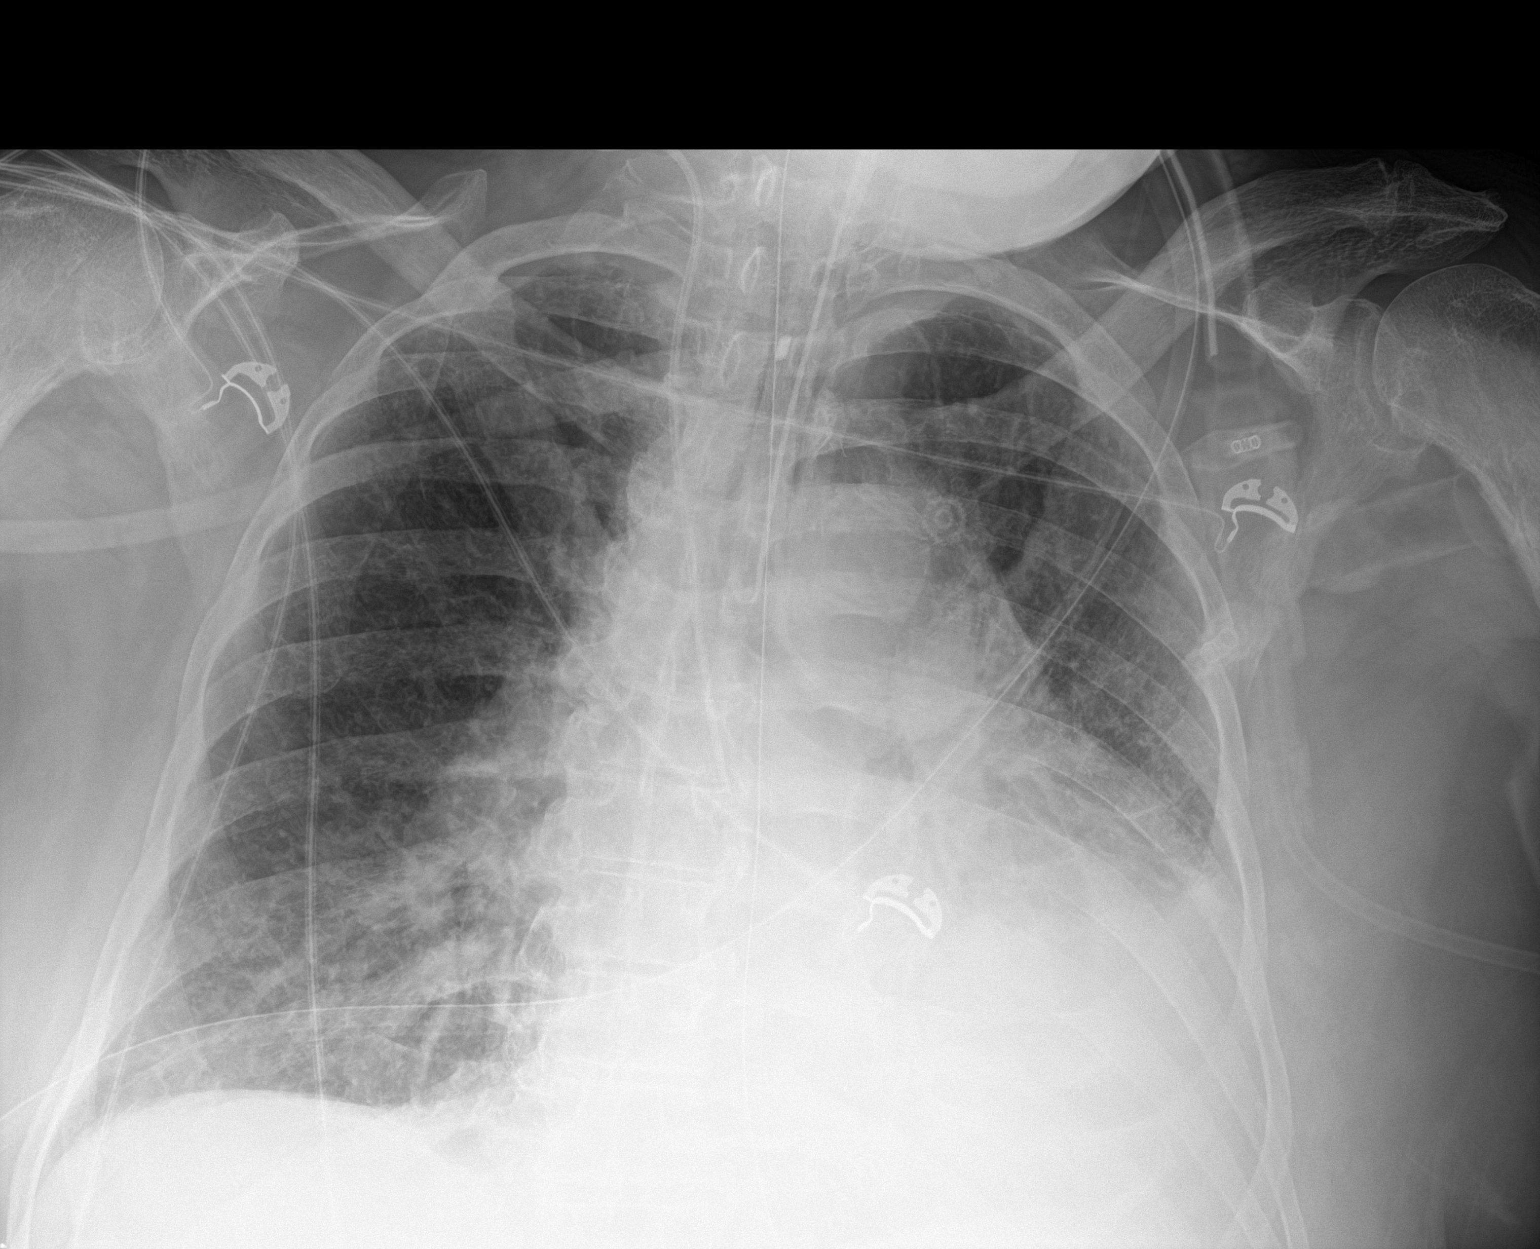

[1 of 1 positions shown; findings below may reference images not displayed]

FINDINGS: Cardiac shadow is mildly enlarged but stable. Endotracheal tube,
gastric catheter and right jugular central line are again seen and
stable. Persistent left basilar atelectasis with associated effusion
is noted. No new focal infiltrate is seen.
IMPRESSION: Stable appearance of the chest from the previous day.

## 2020-06-14 DIAGNOSIS — M6281 Muscle weakness (generalized): Secondary | ICD-10-CM | POA: Diagnosis not present

## 2020-06-14 IMAGING — DX PORTABLE CHEST - 1 VIEW
1 series · 1 of 1 positions shown · non-contrast
Comparison: Yesterday

CLINICAL DATA: Respiratory failure

EXAM:
PORTABLE CHEST 1 VIEW

[chest ap]
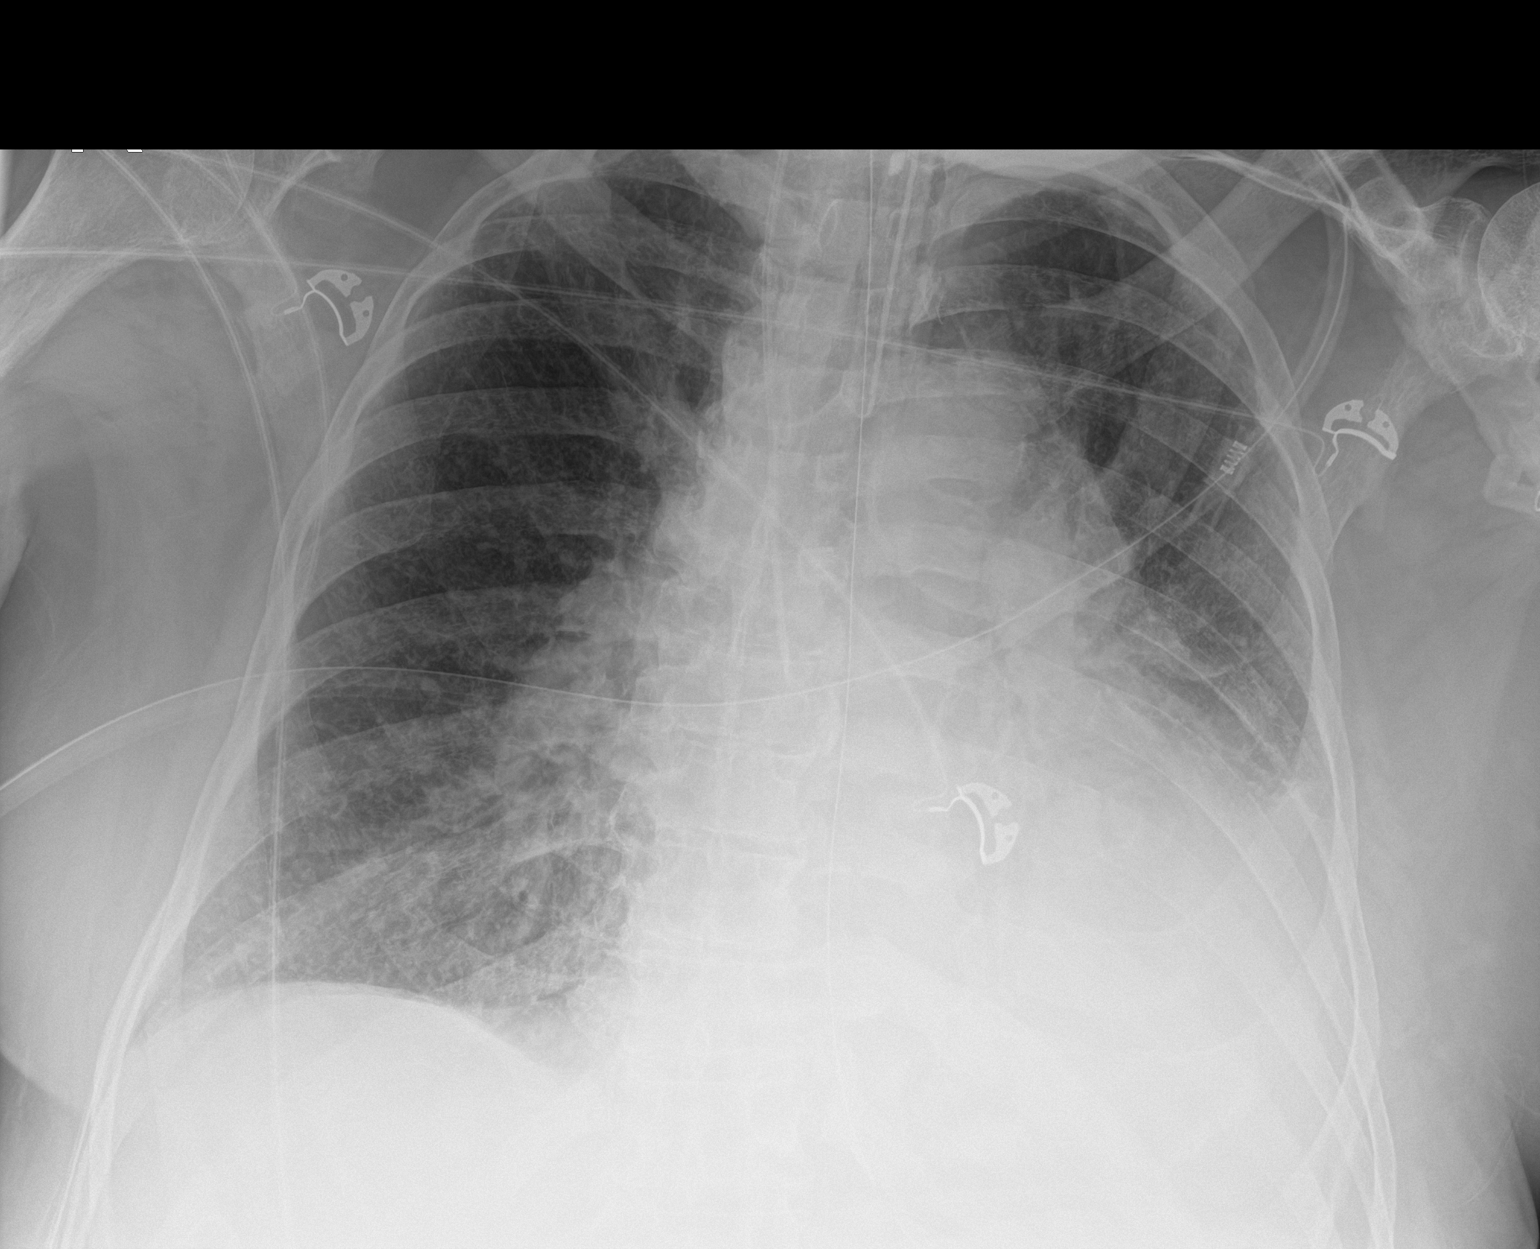

[1 of 1 positions shown; findings below may reference images not displayed]

FINDINGS: Endotracheal tube tip at the clavicular heads. The orogastric tube
at least reaches the stomach. Right IJ line with tip at the SVC.
Lung opacity at the bases with small left pleural effusion. No
pneumothorax. Cardiomegaly.
IMPRESSION: 1. Unremarkable hardware positioning.
2. Lower lobe atelectasis and pleural fluid based on recent chest
CT. No change from yesterday.

## 2020-06-15 IMAGING — DX PORTABLE CHEST - 1 VIEW
1 series · 1 of 1 positions shown · non-contrast
Comparison: 01/10/2019

CLINICAL DATA: Follow-up endotracheal tube

EXAM:
PORTABLE CHEST 1 VIEW

[chest ap]
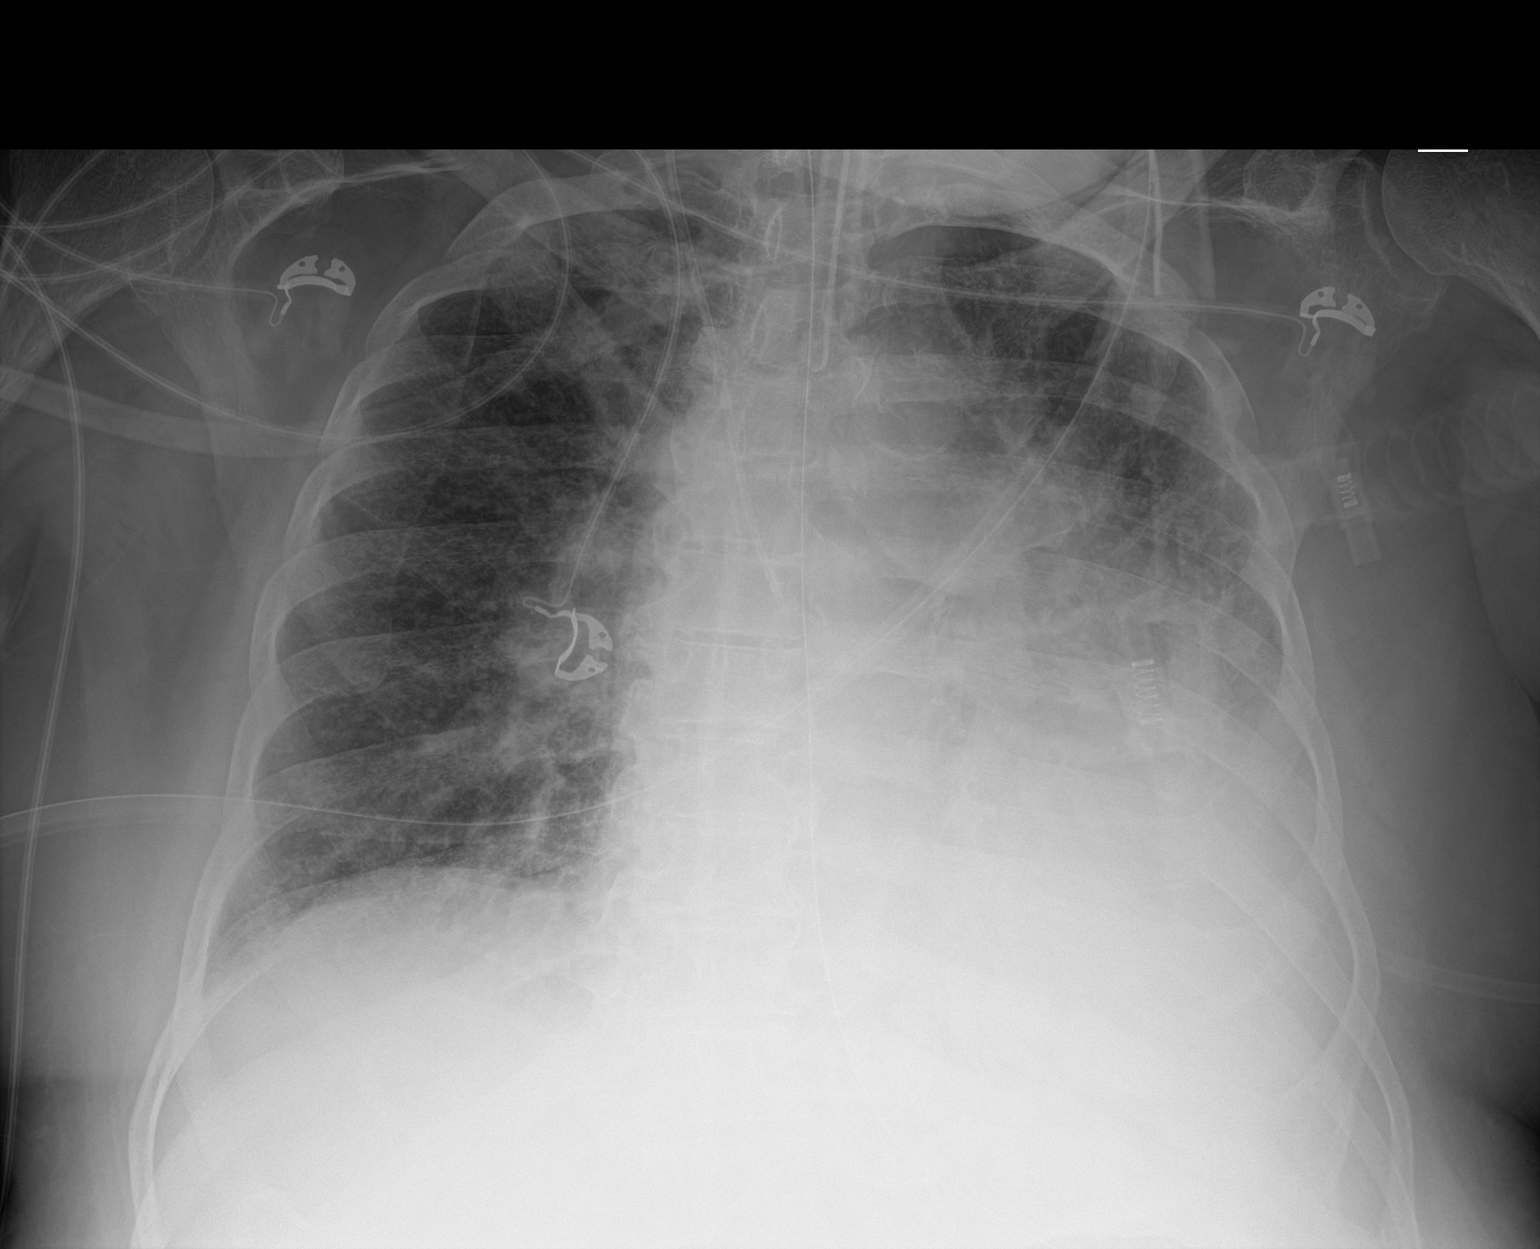

[1 of 1 positions shown; findings below may reference images not displayed]

FINDINGS: Endotracheal tube, gastric catheter and right jugular central line
are again seen and stable. Cardiac shadow remains enlarged.
Persistent left basilar opacity and effusion is seen. No new focal
infiltrate on the right is noted.
IMPRESSION: Stable appearance of left basilar infiltrate with effusion.

Tubes and lines as described.

## 2020-06-18 ENCOUNTER — Other Ambulatory Visit: Payer: No Typology Code available for payment source

## 2020-06-19 ENCOUNTER — Ambulatory Visit: Payer: No Typology Code available for payment source | Admitting: Internal Medicine

## 2020-06-21 ENCOUNTER — Encounter: Payer: Self-pay | Admitting: Internal Medicine

## 2020-06-21 ENCOUNTER — Other Ambulatory Visit: Payer: Self-pay

## 2020-06-21 ENCOUNTER — Ambulatory Visit (INDEPENDENT_AMBULATORY_CARE_PROVIDER_SITE_OTHER): Payer: Medicare HMO | Admitting: Internal Medicine

## 2020-06-21 ENCOUNTER — Ambulatory Visit (INDEPENDENT_AMBULATORY_CARE_PROVIDER_SITE_OTHER): Payer: No Typology Code available for payment source

## 2020-06-21 ENCOUNTER — Other Ambulatory Visit (HOSPITAL_COMMUNITY): Payer: No Typology Code available for payment source

## 2020-06-21 DIAGNOSIS — J449 Chronic obstructive pulmonary disease, unspecified: Secondary | ICD-10-CM

## 2020-06-21 DIAGNOSIS — J439 Emphysema, unspecified: Secondary | ICD-10-CM | POA: Diagnosis not present

## 2020-06-21 DIAGNOSIS — R0602 Shortness of breath: Secondary | ICD-10-CM | POA: Diagnosis not present

## 2020-06-21 DIAGNOSIS — R0609 Other forms of dyspnea: Secondary | ICD-10-CM

## 2020-06-21 DIAGNOSIS — R0600 Dyspnea, unspecified: Secondary | ICD-10-CM | POA: Diagnosis not present

## 2020-06-21 DIAGNOSIS — J9611 Chronic respiratory failure with hypoxia: Secondary | ICD-10-CM | POA: Diagnosis not present

## 2020-06-21 MED ORDER — ALBUTEROL SULFATE (2.5 MG/3ML) 0.083% IN NEBU: 2.5000 mg | INHALATION_SOLUTION | RESPIRATORY_TRACT | 12 refills | Status: DC | PRN

## 2020-06-21 MED ORDER — BREZTRI AEROSPHERE 160-9-4.8 MCG/ACT IN AERO: 2.0000 | INHALATION_SPRAY | Freq: Two times a day (BID) | RESPIRATORY_TRACT | 0 refills | Status: DC

## 2020-06-21 MED ORDER — BREZTRI AEROSPHERE 160-9-4.8 MCG/ACT IN AERO: 2.0000 | INHALATION_SPRAY | Freq: Two times a day (BID) | RESPIRATORY_TRACT | 11 refills | Status: AC

## 2020-06-21 NOTE — Progress Notes (Signed)
Subjective:     Patient ID: Nicholas Caldwell, male   DOB: 1946-08-15,    MRN: [id number]    Brief patient profile:  49  yobm  Quit  smoking 11/2018 with baseline wt  260 around 2016  and able to walk slow pace = [facility] = [employer] / carried groceries to/ from car  then slowed down by  R Hip and low back pain and gained about 20 lb with progressive doe  and referred to pulmonary clinic 12/28/2017 by Dr   Chalmers Cater for surgical clearance for R THR with spirometry restrictive 12/28/2017 while maint on symb 160  for presumed AB related to smoking   History of Present Illness  12/28/2017 1st [facility] office visit/ Nicholas Caldwell   Chief Complaint  Patient presents with  . Pulmonary Consult    Referred by Dr. Barrington Ellison. Pt c/o SOB for the past year. He states he gets SOB just walking a few steps.   on 3lpm x 24/7 per day per cards x 1.5 years now sob x across the room no better on symb though hfa poor  Able to lie down hs on 2 pillows with minimal am congestion chronically  Feels has had a cold x last sev months > thick white mucus  rec Plan A = Automatic = Symbicort 160 Take 2 puffs first thing in am and then another 2 puffs about 12 hours later.  Work on inhaler technique: Plan B = Backup Only use your albuterol (proair) as a rescue medication Plan C = Crisis - only use your albuterol nebulizer if you first try Plan B and it fails to help The key is to stop smoking completely before smoking completely stops you! For cough > mucinex dm 1200 mg every 12 hours as needed     06/21/2020  f/u ov/Nicholas Caldwell re: previously COPD GOLD 0 maint now on symb 160 2 bid  Chief Complaint  Patient presents with  . Follow-up    SOB worse since last visit   Dyspnea:  Limited by hip/  Also across the room  makes him sob / 3lpm Cough: congested / rattling worse first thing in am  Sleeping: on side electric bed  slt elevation x 2 pillows SABA use: nebulizers  02: 3lpm 24/7    No obvious day to day or daytime  variability or assoc excess/ purulent sputum or mucus plugs or hemoptysis or cp or chest tightness, subjective wheeze or overt sinus or hb symptoms.   Sleeping  without nocturnal    exacerbation  of respiratory  c/o's or need for noct saba. Also denies any obvious fluctuation of symptoms with weather or environmental changes or other aggravating or alleviating factors except as outlined above   No unusual exposure hx or h/o childhood pna/ asthma or knowledge of premature birth.  Current Allergies, Complete Past Medical History, Past Surgical History, Family History, and Social History were reviewed in [employer] record.  ROS  The following are not active complaints unless bolded Hoarseness, sore throat, dysphagia, dental problems, itching, sneezing,  nasal congestion or discharge of excess mucus or purulent secretions, ear ache,   fever, chills, sweats, unintended wt loss or wt gain, classically pleuritic or exertional cp,  orthopnea pnd or arm/hand swelling  or leg swelling, presyncope, palpitations, abdominal pain, anorexia, nausea, vomiting, diarrhea  or change in bowel habits or change in bladder habits, change in stools or change in urine, dysuria, hematuria,  rash, arthralgias, visual complaints, headache, numbness, weakness or  ataxia or problems with walking or coordination,  change in mood or  memory.        Current Meds  Medication Sig  . albuterol (VENTOLIN HFA) 108 (90 Base) MCG/ACT inhaler Inhale 2 puffs into the lungs every 6 (six) hours as needed for wheezing or shortness of breath.  Marland Kitchen amiodarone (PACERONE) 200 MG tablet  off x 6 m  . apixaban (ELIQUIS) 5 MG TABS tablet Take 1 tablet (5 mg total) by mouth 2 (two) times daily. (Patient taking differently: Take 2.5 mg by mouth 2 (two) times daily. )  . budesonide-formoterol (SYMBICORT) 160-4.5 MCG/ACT inhaler Inhale 2 puffs into the lungs as needed (shortness of breath).   . Ensure Max Protein (ENSURE MAX  PROTEIN) LIQD Take 330 mLs (11 oz total) by mouth daily.  . famotidine (PEPCID) 20 MG tablet Take 1 tablet (20 mg total) by mouth 2 (two) times daily.  . FEROSUL 325 (65 Fe) MG tablet Take 325 mg by mouth 2 (two) times daily.  . fluticasone (FLONASE) 50 MCG/ACT nasal spray Place 2 sprays into both nostrils daily as needed for allergies.   Marland Kitchen gabapentin (NEURONTIN) 300 MG capsule Take 1 capsule (300 mg total) by mouth 2 (two) times daily. (Patient taking differently: Take 300 mg by mouth daily. )  . hydrocortisone (ANUSOL-HC) 2.5 % rectal cream Place 1 application rectally 2 (two) times daily as needed for hemorrhoids or itching.   . hydrocortisone 1 % lotion Apply 1 application topically See admin instructions. Apply small amount to back twice daily for itchy rash as needed  . insulin aspart (NOVOLOG) 100 UNIT/ML injection Inject 4 Units into the skin 3 (three) times daily with meals.  . insulin aspart protamine - aspart (NOVOLOG MIX 70/30 FLEXPEN) (70-30) 100 UNIT/ML FlexPen Inject 0.1 mLs (10 Units total) into the skin 2 (two) times daily.  . Insulin Syringe 27G X 1/2" 0.5 ML MISC 100 Syringes by Does not apply route 3 (three) times daily.  Marland Kitchen ipratropium-albuterol (DUONEB) 0.5-2.5 (3) MG/3ML SOLN Take 3 mLs by nebulization every 6 (six) hours as needed (shortness of breath/wheezing).  Marland Kitchen ipratropium-albuterol (DUONEB) 0.5-2.5 (3) MG/3ML SOLN Take 3 mLs by nebulization every 6 (six) hours as needed.  Marland Kitchen oxybutynin (DITROPAN) 5 MG tablet Take 1 tablet (5 mg total) by mouth 4 (four) times daily.  Marland Kitchen oxyCODONE-acetaminophen (PERCOCET) 10-325 MG tablet Take 1 tablet by mouth every 6 (six) hours as needed for pain.   . OXYGEN Inhale 3 L into the lungs continuous.   . polyethylene glycol (MIRALAX / GLYCOLAX) 17 g packet Take 17 g by mouth daily as needed.  . potassium chloride SA (KLOR-CON) 20 MEQ tablet Take 4 tablets (80 mEq total) by mouth in the morning AND 3 tablets (60 mEq total) every evening. With  additional 88mq when you take Metolazone..  . PRESCRIPTION MEDICATION Cpap  . rosuvastatin (CRESTOR) 20 MG tablet Take 20 mg by mouth at bedtime.  . senna-docusate (SENOKOT-S) 8.6-50 MG tablet Take 1 tablet by mouth at bedtime as needed for mild constipation.  . sodium chloride (OCEAN) 0.65 % SOLN nasal spray Place 2 sprays into both nostrils as needed for congestion.   . torsemide (DEMADEX) 20 MG tablet Take 4 tablets (80 mg total) by mouth 2 (two) times daily.                      Objective:   Physical Exam   obese w/c bound bm nad    06/21/2020  246   12/28/17 278 lb (126.1 kg)  12/08/17 286 lb 6.4 oz (129.9 kg)  10/22/17 268 lb (121.6 kg)     Vital signs reviewed  06/21/2020  - Note at rest 02 sats  97% on 3lpm      HEENT : pt wearing mask not removed for exam due to covid -19 concerns.    NECK :  without JVD/Nodes/TM/ nl carotid upstrokes bilaterally   LUNGS: no acc muscle use,  Nl contour chest with distant bs/ a few insp crackles bases  bilaterally without cough on insp or exp maneuvers   CV:  RRR  no s3 or murmur or increase in P2, and  1+ bil sym LE pitting edema   ABD: Obese soft and nontender with nl inspiratory excursion in the supine position. No bruits or organomegaly appreciated, bowel sounds nl  MS:  Nl gait/ ext warm without deformities, calf tenderness, cyanosis or clubbing No obvious joint restrictions   SKIN: warm and dry without lesions    NEURO:  alert, approp, nl sensorium with  no motor or cerebellar deficits apparent.    I personally reviewed images and agree with radiology impression as follows:  CXR:   Pa and lat 11/21/19 1. Cardiomegaly with vascular congestion and pulmonary edema. Probable trace pleural effusions. 2. Patchy more confluent airspace disease at the bases may reflect atelectasis or superimposed pneumonia   CXR PA and Lateral:   06/21/2020 :    I personally reviewed images and agree with radiology impression as  follows:    Cardiac enlargement, chronic lung changes and probable superimposed CHF.     Assessment:

## 2020-06-21 NOTE — Patient Instructions (Signed)
Plan A = Automatic =  Breztri  Take 2 puffs first thing in am and then another 2 puffs about 12 hours later.   Work on inhaler technique:  relax and gently blow all the way out then take a nice smooth deep breath back in, triggering the inhaler at same time you start breathing in.  Hold for up to 5 seconds if you can. Blow out thru nose. Rinse and gargle with water when done    Plan B = Backup Only use your albuterol (Proair=Red)  as a rescue medication to be used if you can't catch your breath by resting or doing a relaxed purse lip breathing pattern.  - The less you use it, the better it will work when you need it. - Ok to use the inhaler up to 2 puffs  every 4 hours if you must but call for appointment if use goes up over your usual need - Don't leave home without it !!  (think of it like the spare tire for your car)   Plan C = Crisis - only use your albuterol nebulizer if you first try Plan B and it fails to help > ok to use the nebulizer up to every 4 hours but if start needing it regularly call for immediate appointment  For cough > mucinex dm 1200 mg every 12 hours as needed     Please remember to go to the  x-ray department downstairs in the basement  for your tests - we will call you with the results when they are available.   Please schedule a follow up visit in 3 months but call sooner if needed

## 2020-06-22 ENCOUNTER — Encounter: Payer: Self-pay | Admitting: Internal Medicine

## 2020-06-22 NOTE — Assessment & Plan Note (Addendum)
As of 06/21/2020 = 3lpm 24/7   Adequate control on present rx, reviewed in detail with pt > no change in rx needed    >>>  Advised goal is to keep sats > 90% at all times esp given issues with chf

## 2020-06-22 NOTE — Assessment & Plan Note (Signed)
Echo 12/13/16 Left ventricle: The cavity size was normal. Wall thickness was   increased in a pattern of mild LVH. Systolic function was mildly   to moderately reduced. The estimated ejection fraction was in the   range of 40% to 45%. Diffuse hypokinesis. There is hypokinesis of   the basal-midinferior and inferoseptal myocardium. Features are   consistent with a pseudonormal left ventricular filling pattern,   with concomitant abnormal relaxation and increased filling   pressure (grade 2 diastolic dysfunction). - Left atrium: The atrium was moderately dilated. - Right ventricle: The cavity size was mildly dilated. Wall   thickness was normal. - Right atrium: The atrium was moderately dilated. - Pulmonary arteries: Systolic pressure was moderately increased.   PA peak pressure: 56 mm Hg (S).    Probably has component of cardiac asthma as well which is best approached by cards plus add anticholinergic to symbicort = breztri as there is a cholinergic component in most pts with cardiac asthma          Each maintenance medication was reviewed in detail including emphasizing most importantly the difference between maintenance and prns and under what circumstances the prns are to be triggered using an action plan format where appropriate.  Total time for H and P, chart review, counseling, teaching device and generating customized AVS unique to this office visit / charting = 30 min

## 2020-06-22 NOTE — Assessment & Plan Note (Signed)
Quit smoking 11/2018 PFT's  12/28/2017  FEV1 1.41 (43 % ) ratio 77  p no % improvement from saba p symb 160 prior to study with DLCO  23 % corrects to 64  % for alv volume - 06/21/2020  After extensive coaching inhaler device,  effectiveness =    75% try breztri 2 bid      Group D in terms of symptom/risk and laba/lama/ICS  therefore appropriate rx at this point >>>  Breztri trial 2bid plus approp use of saba  I spent extra time with pt today reviewing appropriate use of albuterol for prn use on exertion with the following points: 1) saba is for relief of sob that does not improve by walking a slower pace or resting but rather if the pt does not improve after trying this first. 2) If the pt is convinced, as many are, that saba helps recover from activity faster then it's easy to tell if this is the case by re-challenging : ie stop, take the inhaler, then p 5 minutes try the exact same activity (intensity of workload) that just caused the symptoms and see if they are substantially diminished or not after saba 3) if there is an activity that reproducibly causes the symptoms, try the saba 15 min before the activity on alternate days   If in fact the saba really does help, then fine to continue to use it prn but advised may need to look closer at the maintenance regimen being used to achieve better control of airways disease with exertion.

## 2020-06-25 NOTE — Progress Notes (Signed)
ATC the line was busy

## 2020-06-27 ENCOUNTER — Ambulatory Visit: Payer: Medicare HMO | Admitting: Nurse Practitioner

## 2020-06-27 DIAGNOSIS — J441 Chronic obstructive pulmonary disease with (acute) exacerbation: Secondary | ICD-10-CM | POA: Diagnosis not present

## 2020-06-27 DIAGNOSIS — J9621 Acute and chronic respiratory failure with hypoxia: Secondary | ICD-10-CM | POA: Diagnosis not present

## 2020-06-27 DIAGNOSIS — J449 Chronic obstructive pulmonary disease, unspecified: Secondary | ICD-10-CM | POA: Diagnosis not present

## 2020-06-27 DIAGNOSIS — R269 Unspecified abnormalities of gait and mobility: Secondary | ICD-10-CM | POA: Diagnosis not present

## 2020-06-29 ENCOUNTER — Other Ambulatory Visit: Payer: No Typology Code available for payment source

## 2020-07-02 NOTE — Progress Notes (Signed)
Spoke with the pt's spouse ok per DPR and notified of results and she verbalized understanding.

## 2020-07-10 ENCOUNTER — Other Ambulatory Visit (HOSPITAL_COMMUNITY): Payer: No Typology Code available for payment source

## 2020-07-10 ENCOUNTER — Inpatient Hospital Stay: Admission: RE | Admit: 2020-07-10 | Payer: No Typology Code available for payment source | Source: Ambulatory Visit

## 2020-07-25 ENCOUNTER — Ambulatory Visit (HOSPITAL_COMMUNITY)
Admission: RE | Admit: 2020-07-25 | Discharge: 2020-07-25 | Disposition: A | Discharge status: Home / Self Care | Payer: Medicare HMO | Source: Ambulatory Visit | Attending: Cardiology | Admitting: Cardiology

## 2020-07-25 ENCOUNTER — Other Ambulatory Visit: Payer: Self-pay

## 2020-07-25 DIAGNOSIS — I5022 Chronic systolic (congestive) heart failure: Secondary | ICD-10-CM

## 2020-07-25 LAB — BASIC METABOLIC PANEL
Anion gap: 15 (ref 5–15)
BUN: 40 mg/dL — ABNORMAL HIGH (ref 8–23)
CO2: 31 mmol/L (ref 22–32)
Calcium: 8.7 mg/dL — ABNORMAL LOW (ref 8.9–10.3)
Chloride: 95 mmol/L — ABNORMAL LOW (ref 98–111)
Creatinine, Ser: 3.64 mg/dL — ABNORMAL HIGH (ref 0.61–1.24)
GFR calc Af Amer: 18 mL/min — ABNORMAL LOW (ref >60)
GFR calc non Af Amer: 16 mL/min — ABNORMAL LOW (ref >60)
Glucose, Bld: 138 mg/dL — ABNORMAL HIGH (ref 70–99)
Potassium: 3.1 mmol/L — ABNORMAL LOW (ref 3.5–5.1)
Sodium: 141 mmol/L (ref 135–145)

## 2020-07-27 ENCOUNTER — Telehealth (HOSPITAL_COMMUNITY): Payer: Self-pay | Admitting: *Deleted

## 2020-07-27 DIAGNOSIS — I5022 Chronic systolic (congestive) heart failure: Secondary | ICD-10-CM

## 2020-07-27 MED ORDER — POTASSIUM CHLORIDE CRYS ER 20 MEQ PO TBCR: 80.0000 meq | EXTENDED_RELEASE_TABLET | Freq: Every day | ORAL | 3 refills | Status: DC

## 2020-07-27 NOTE — Telephone Encounter (Signed)
Marsh Dolly [place], RN  07/27/2020 10:25 AM EDT Back to Top    Spoke w/pt's wife, she states pt is currently taking KCL 60 meq Daily, advised per chart pt should be taking 80/60, she advised he is not taking that though he is only taking 3 tabs (60 meq) once a day. Advised needs to increase by 1 tab, she will increase it to 80 meq Daily, pt is unable to come in for repeat labs next week, bmet sch for 91/9   Philicia R Branch, [place]  07/26/2020 5:00 PM EDT     Tried calling patient, no answer,unable to leave message. Will try again later.

## 2020-07-27 NOTE — Telephone Encounter (Signed)
-----  Message from Larey Dresser, MD sent at 07/26/2020  1:44 PM EDT ----- Increase total daily K by 20 mEq. BMET 1 week.

## 2020-07-28 DIAGNOSIS — R269 Unspecified abnormalities of gait and mobility: Secondary | ICD-10-CM | POA: Diagnosis not present

## 2020-07-28 DIAGNOSIS — J9621 Acute and chronic respiratory failure with hypoxia: Secondary | ICD-10-CM | POA: Diagnosis not present

## 2020-07-28 DIAGNOSIS — J441 Chronic obstructive pulmonary disease with (acute) exacerbation: Secondary | ICD-10-CM | POA: Diagnosis not present

## 2020-07-28 DIAGNOSIS — J449 Chronic obstructive pulmonary disease, unspecified: Secondary | ICD-10-CM | POA: Diagnosis not present

## 2020-07-31 ENCOUNTER — Other Ambulatory Visit: Payer: No Typology Code available for payment source

## 2020-08-06 ENCOUNTER — Other Ambulatory Visit (HOSPITAL_COMMUNITY): Payer: No Typology Code available for payment source

## 2020-08-08 IMAGING — CR CHEST - 2 VIEW
2 series · 2 of 2 positions shown · non-contrast
Comparison: 01/11/2011

CLINICAL DATA: Shortness of breath

EXAM:
CHEST - 2 VIEW

[chest lat]
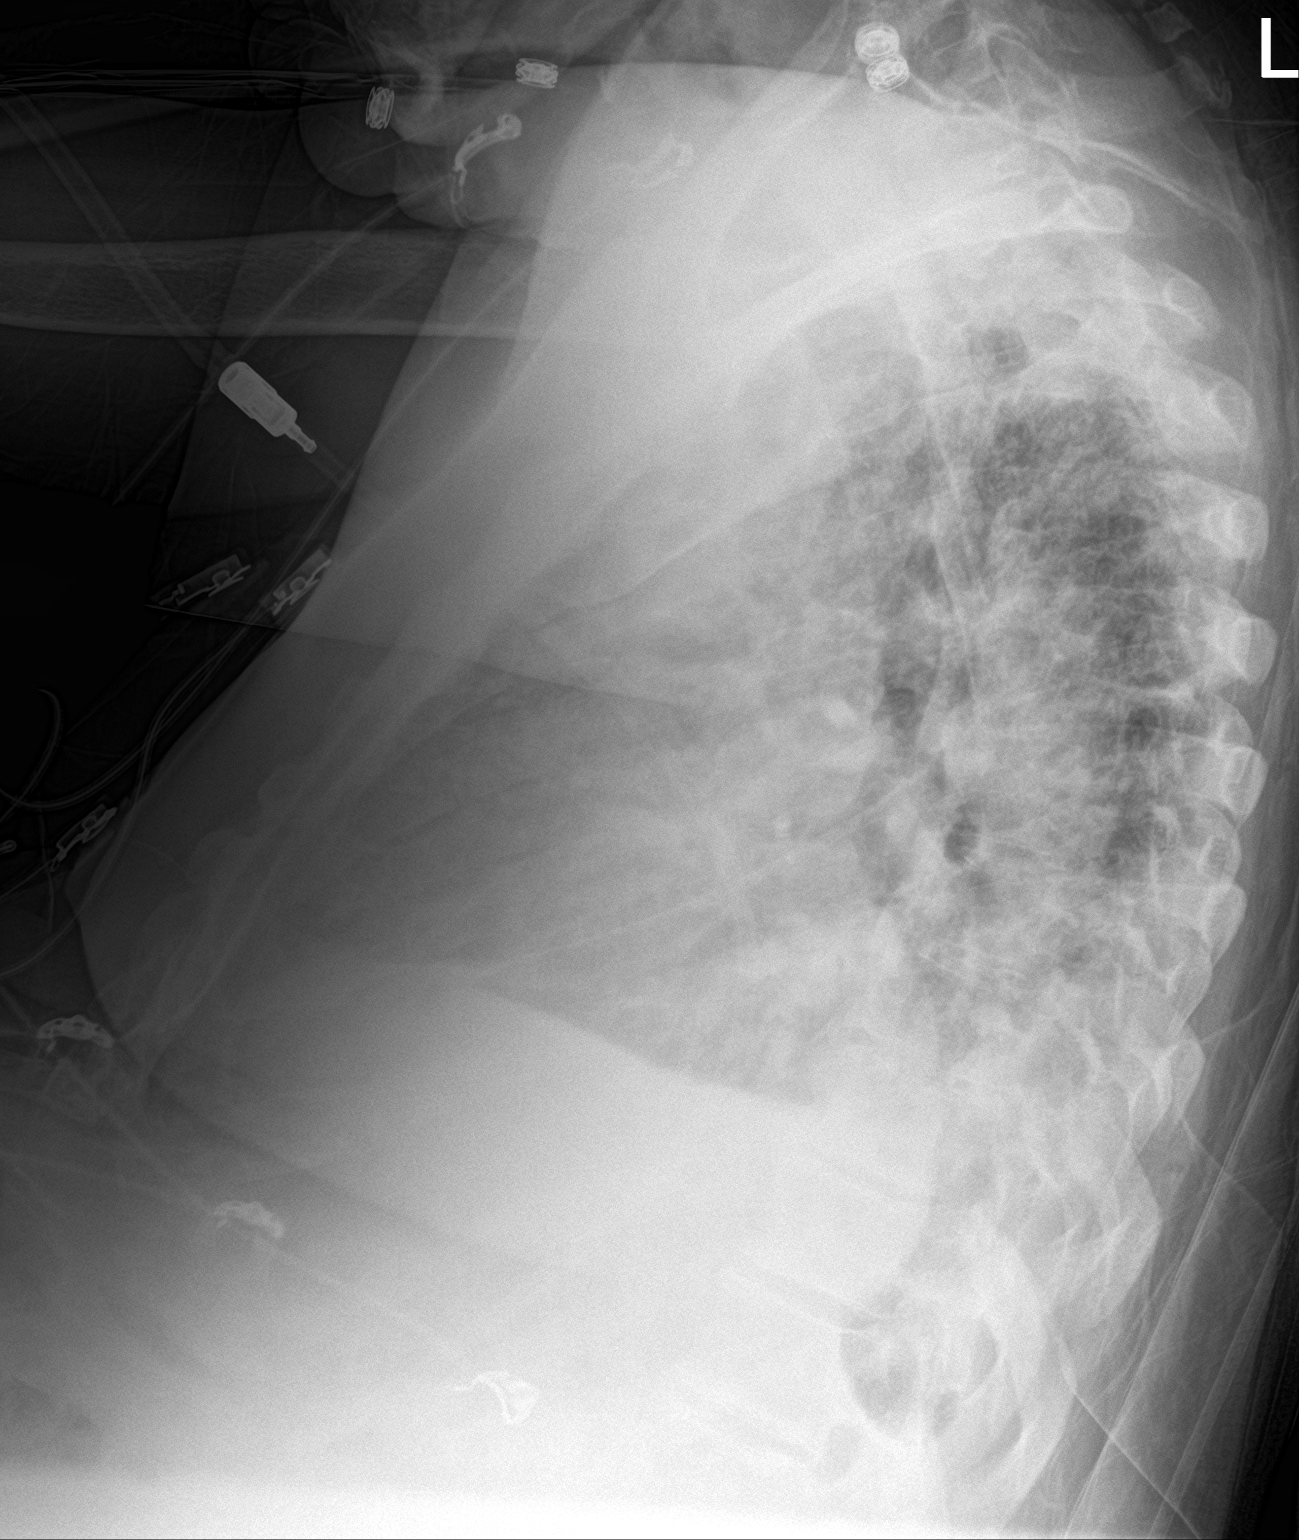

[chest ap]
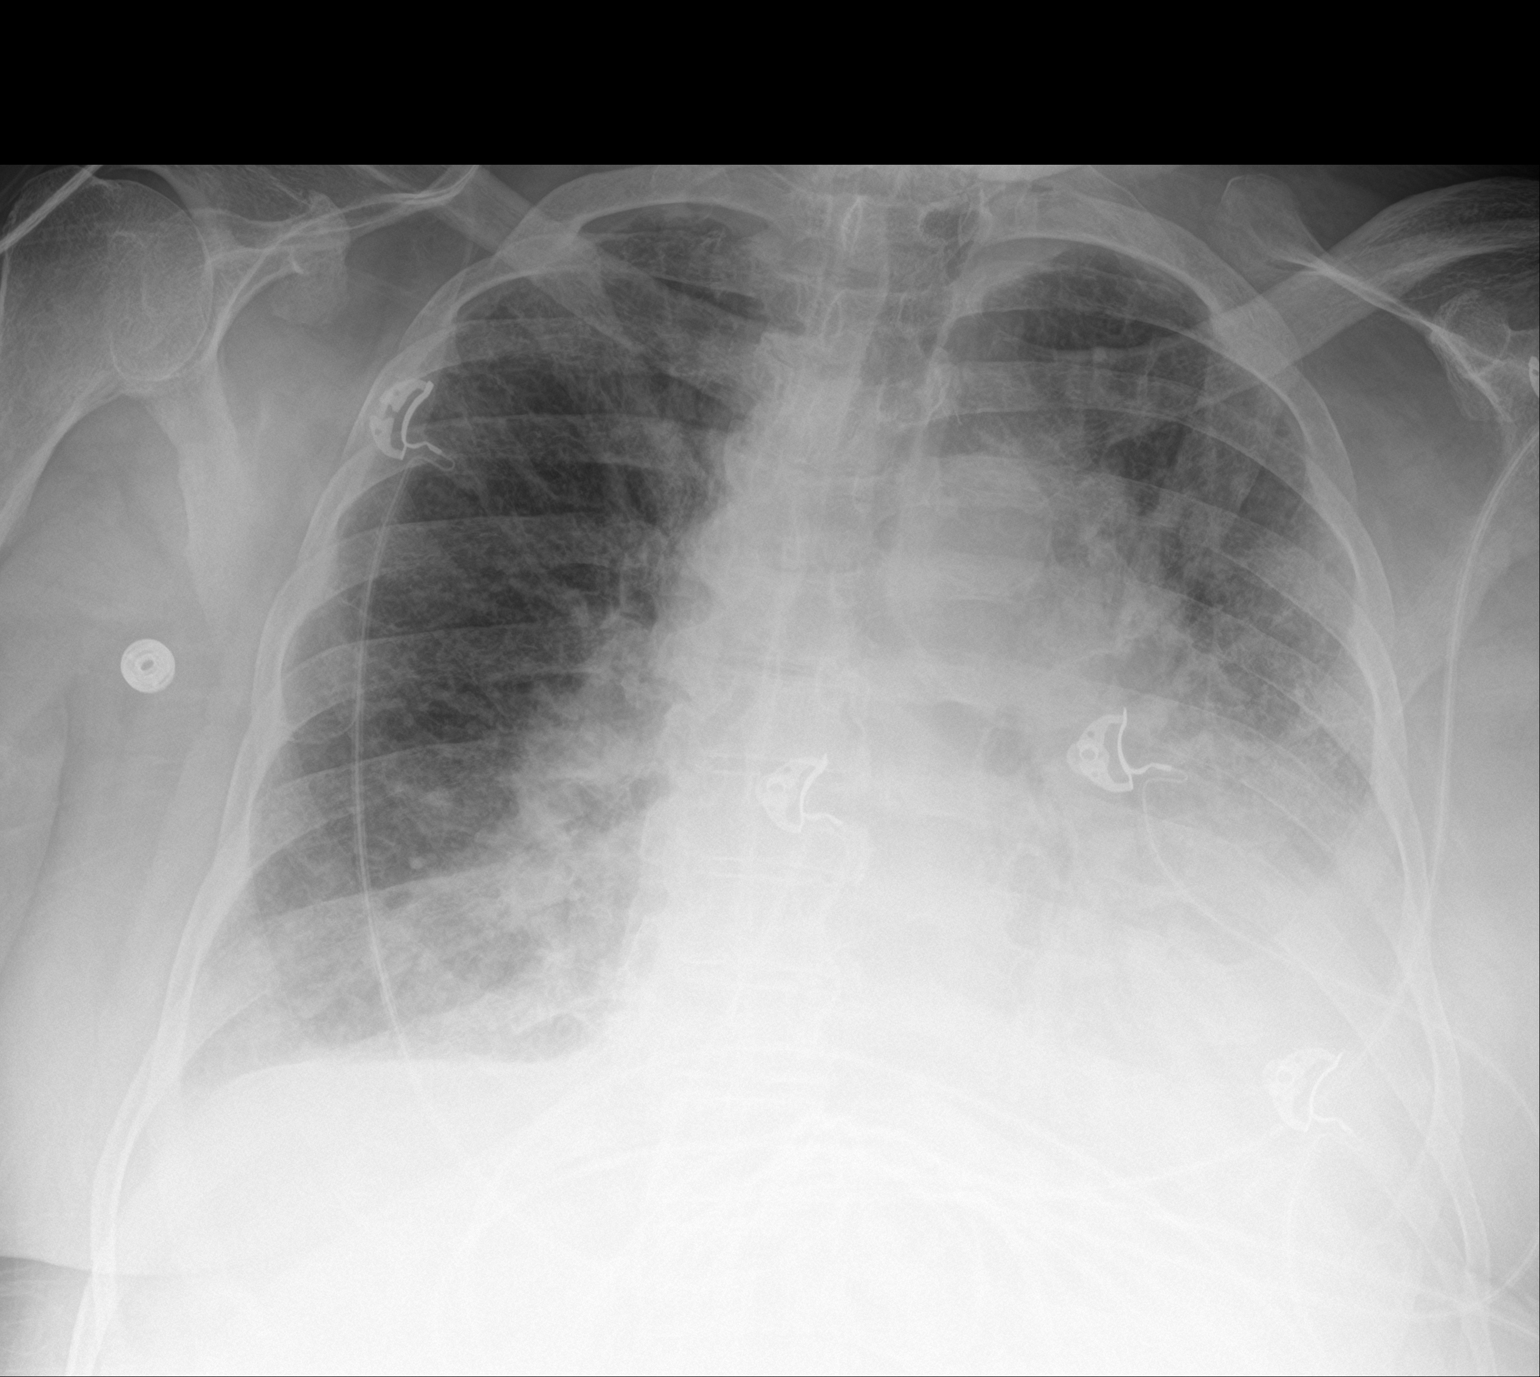

[2 of 2 positions shown; findings below may reference images not displayed]

FINDINGS: Bilateral interstitial and alveolar airspace opacities. Small left
pleural effusion. No pneumothorax. Stable cardiomegaly. No acute
osseous abnormality.
IMPRESSION: Bilateral interstitial and alveolar airspace opacities with stable
cardiomegaly. Differential considerations include pulmonary edema
versus multilobar pneumonia.

## 2020-08-10 IMAGING — DX PORTABLE CHEST - 1 VIEW
2 series · 2 of 2 positions shown · non-contrast
Comparison: 03/06/2019, 01/11/2019

CLINICAL DATA: Increased short of breath

EXAM:
PORTABLE CHEST 1 VIEW

[chest ap (1 of 2)]
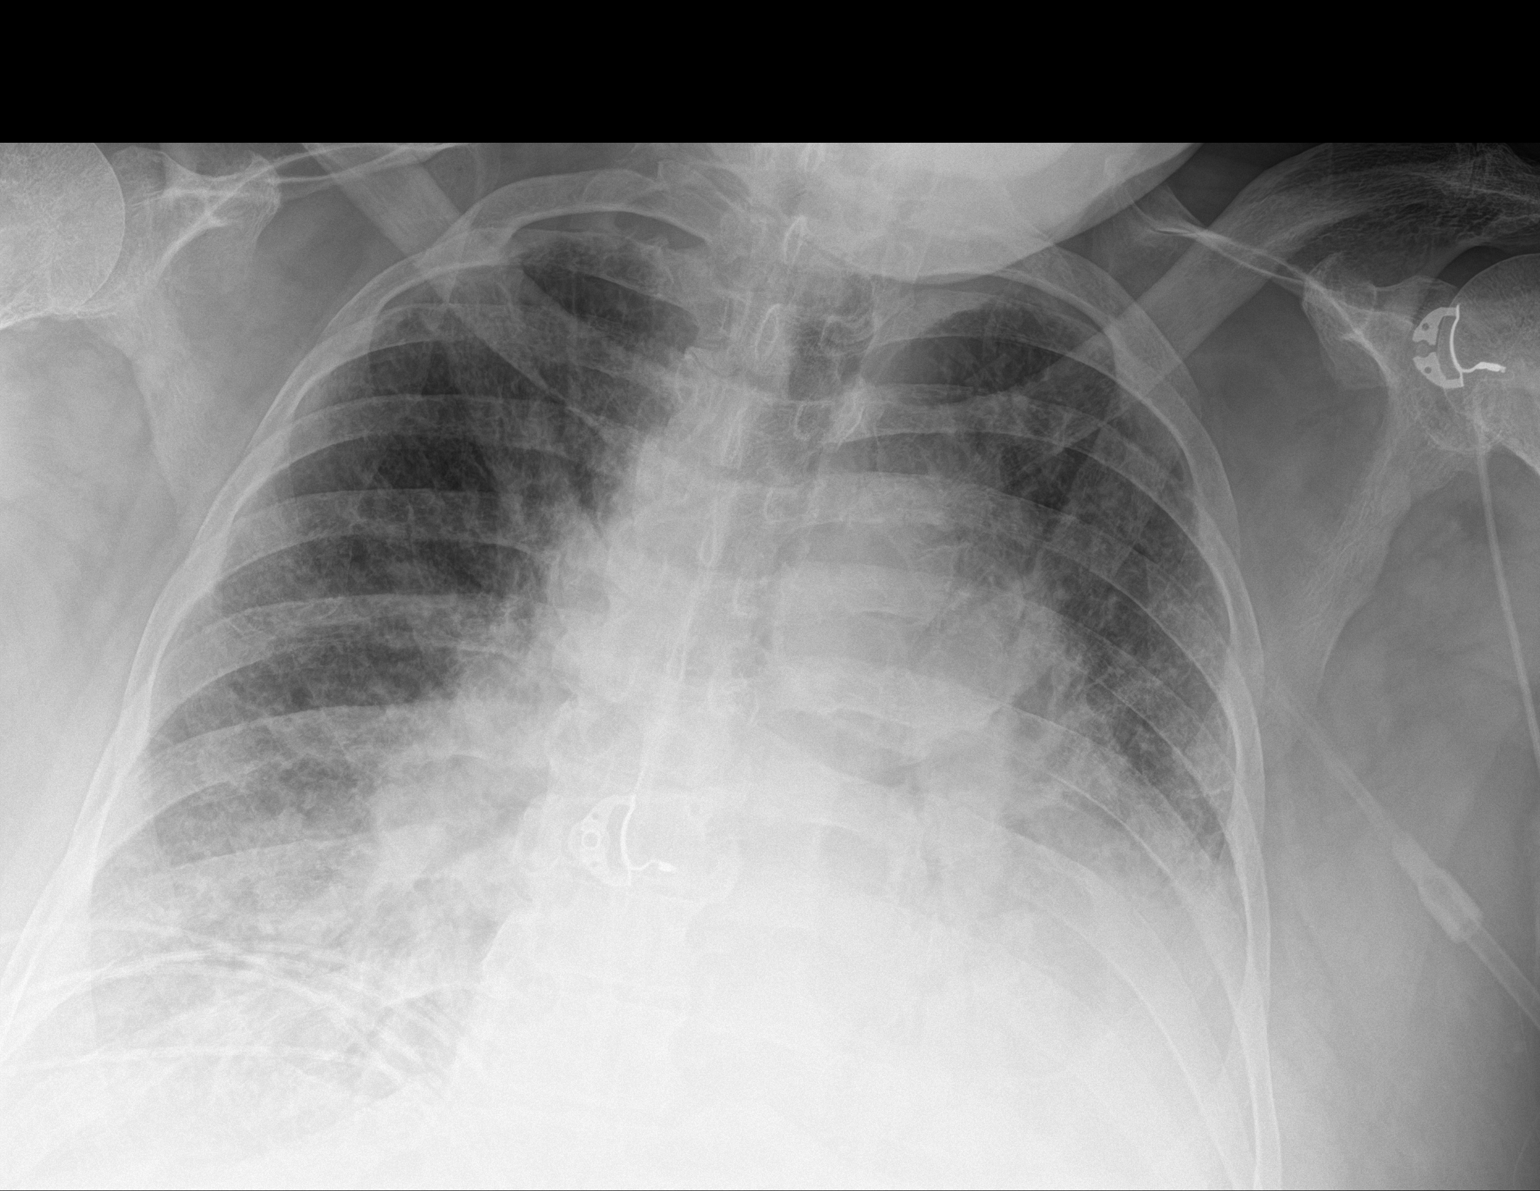

[chest ap (2 of 2)]
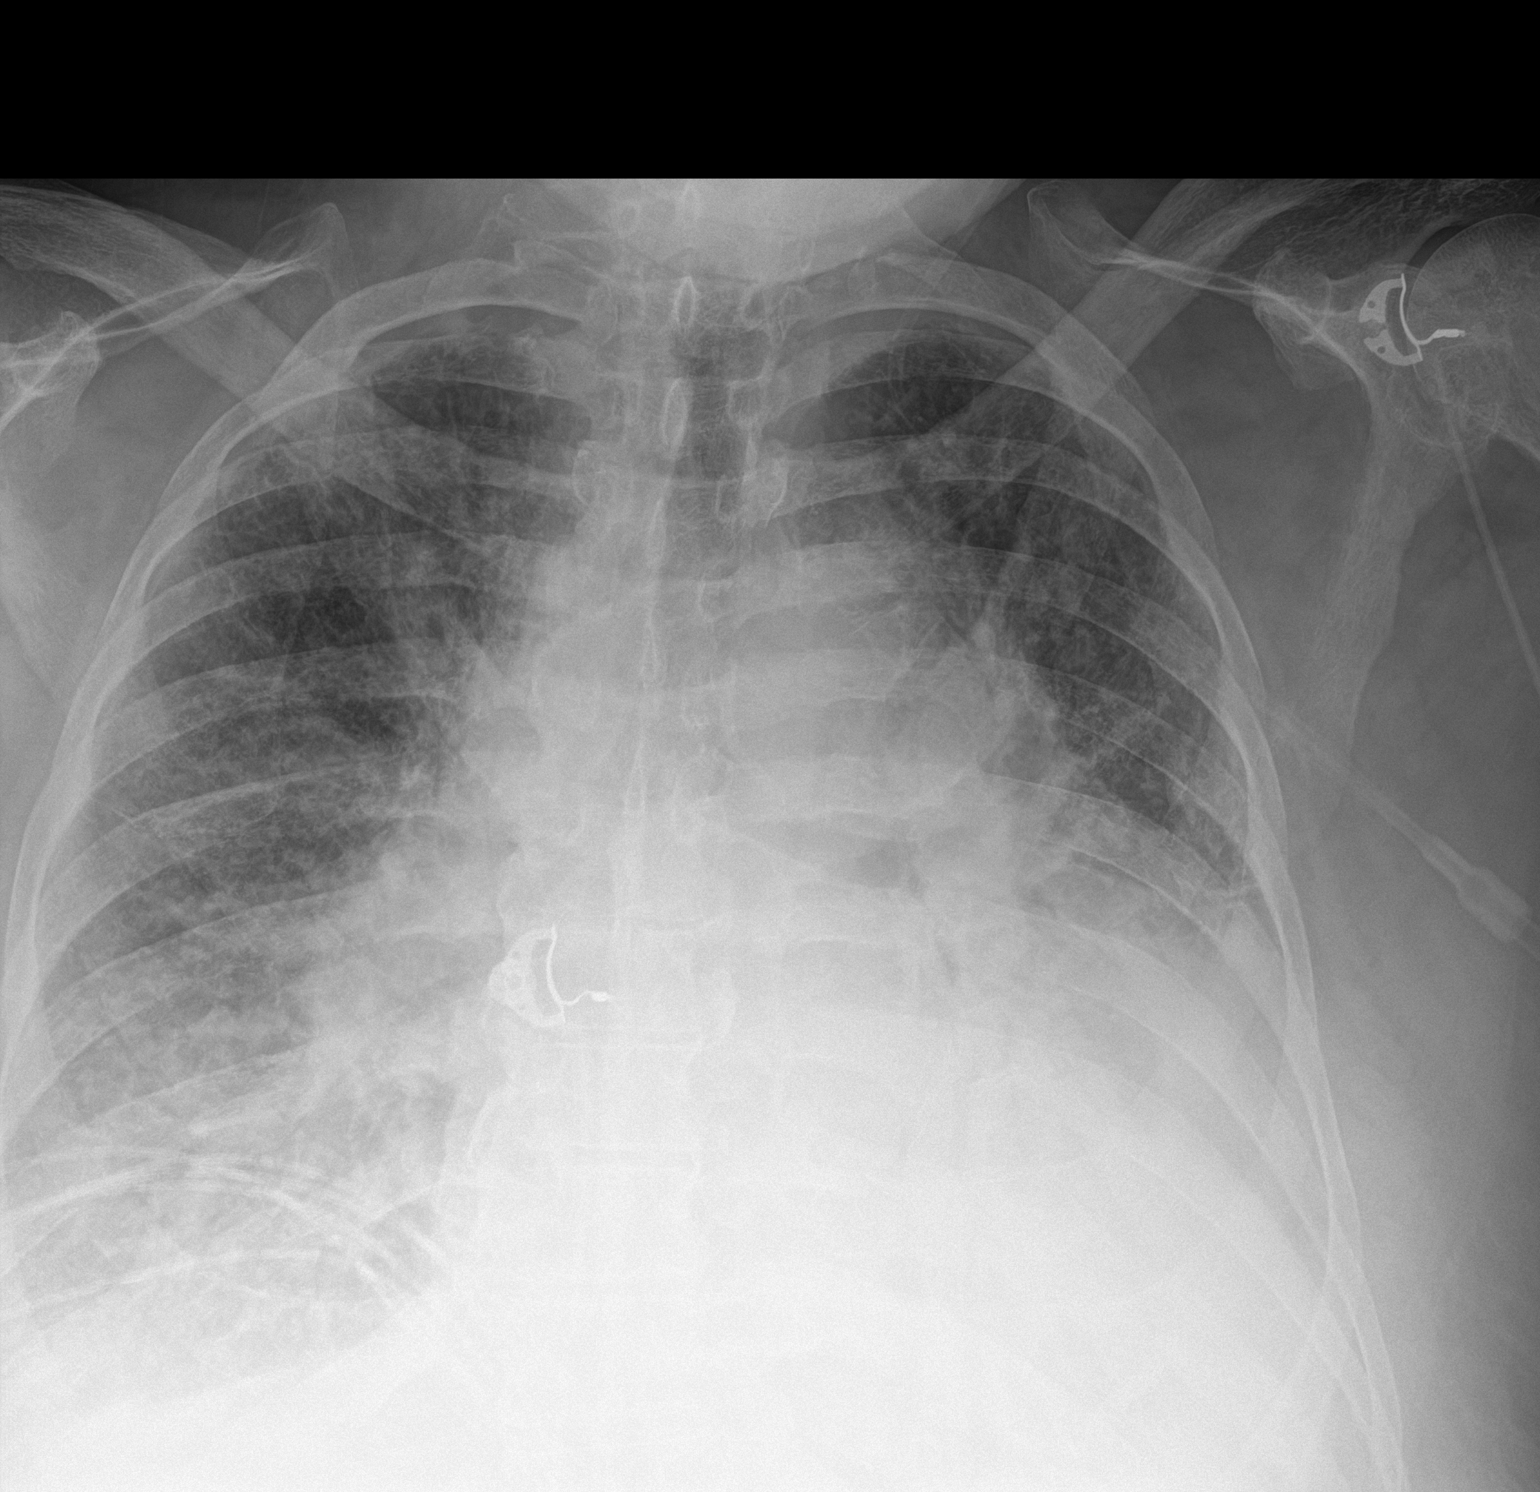

[2 of 2 positions shown; findings below may reference images not displayed]

FINDINGS: Cardiomegaly with vascular congestion and diffuse bilateral
interstitial and ground-glass opacities suspicious for edema.
Probable bilateral pleural effusions. Left basilar airspace
consolidation. No pneumothorax.
IMPRESSION: Cardiomegaly with worsening vascular congestion, pleural effusion,
and interstitial and ground-glass opacities suspicious for edema.
Left lung base consolidation could reflect atelectasis or pneumonia

## 2020-08-17 ENCOUNTER — Other Ambulatory Visit (HOSPITAL_COMMUNITY): Payer: No Typology Code available for payment source

## 2020-08-17 IMAGING — US US ABDOMEN LIMITED
1 series · 5 of 5 positions shown · non-contrast
Comparison: CT 01/02/2019

CLINICAL DATA: Splenectomy

EXAM:
LIMITED ABDOMEN ULTRASOUND FOR ASCITES
TECHNIQUE: Limited ultrasound survey for ascites was performed in all four
abdominal quadrants.

[Series 1: us abdomen limited · 0.33mm/px · 5 of 5 slices shown]
[im 1/5]
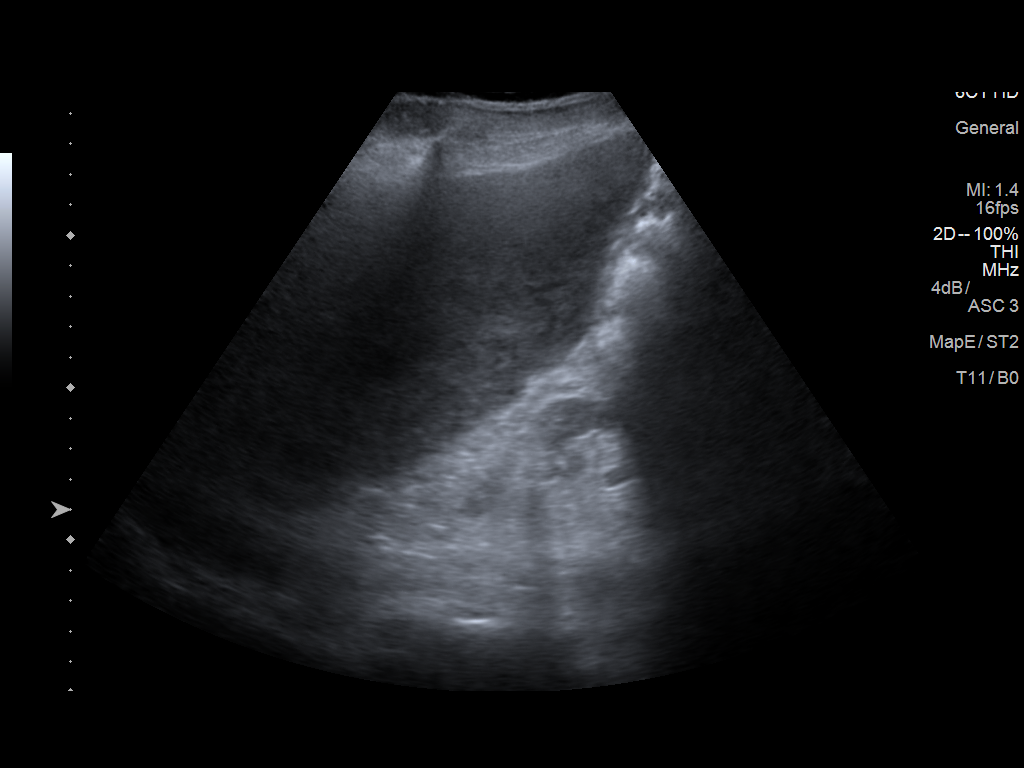
[im 2/5]
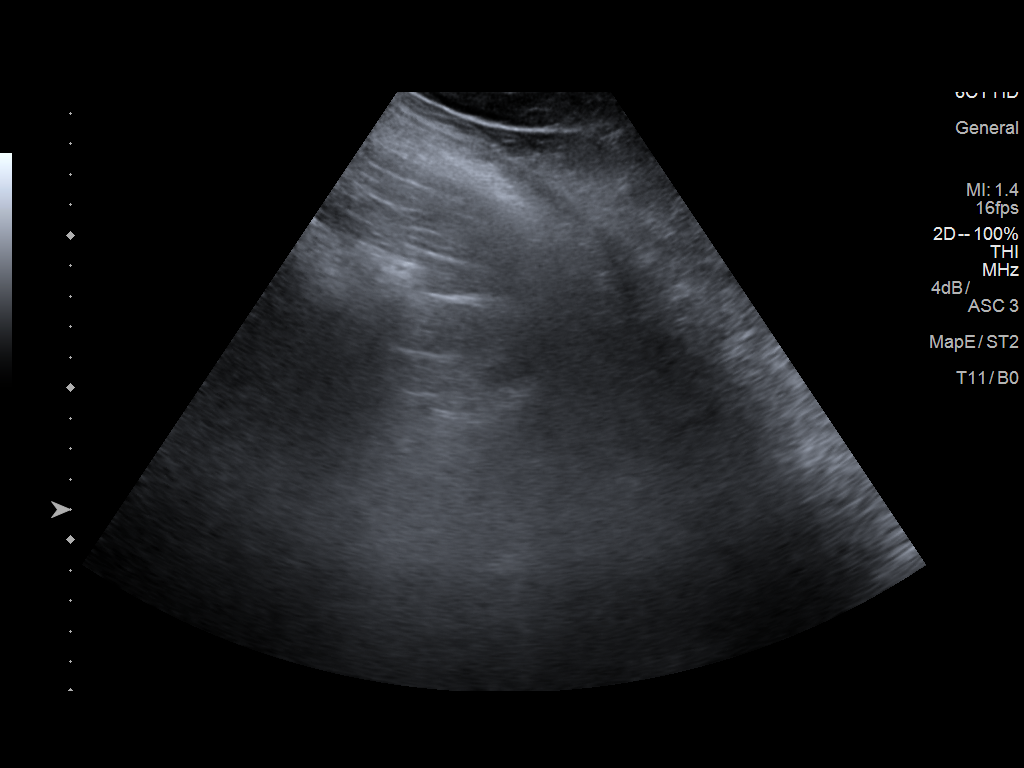
[im 3/5]
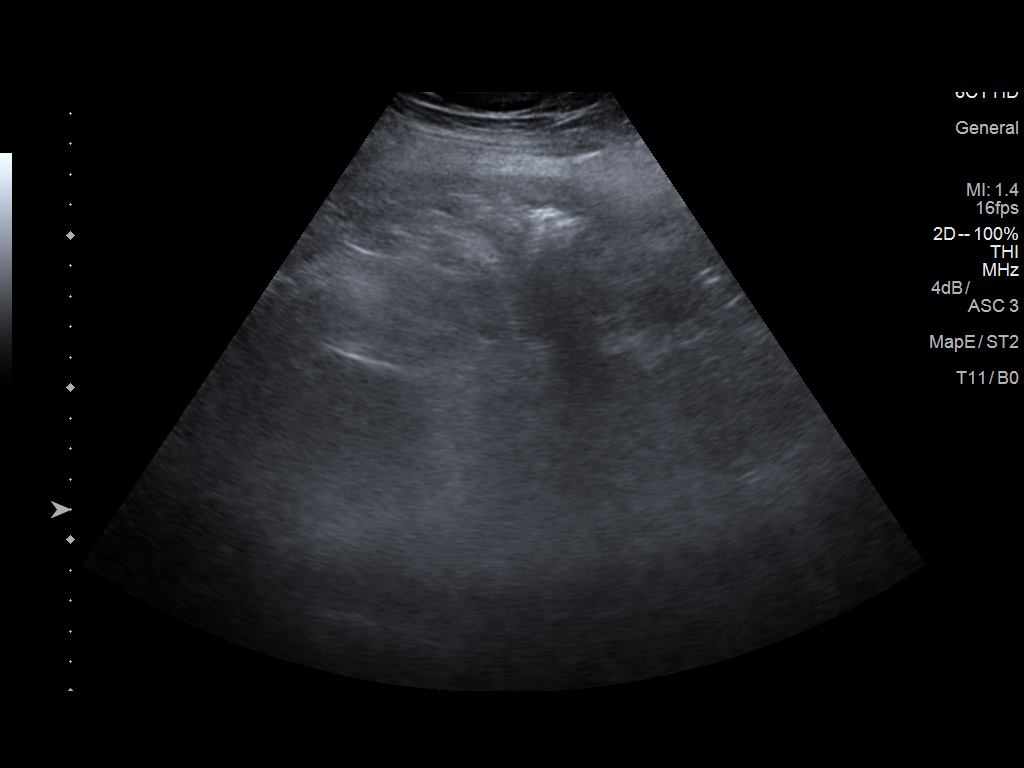
[im 4/5]
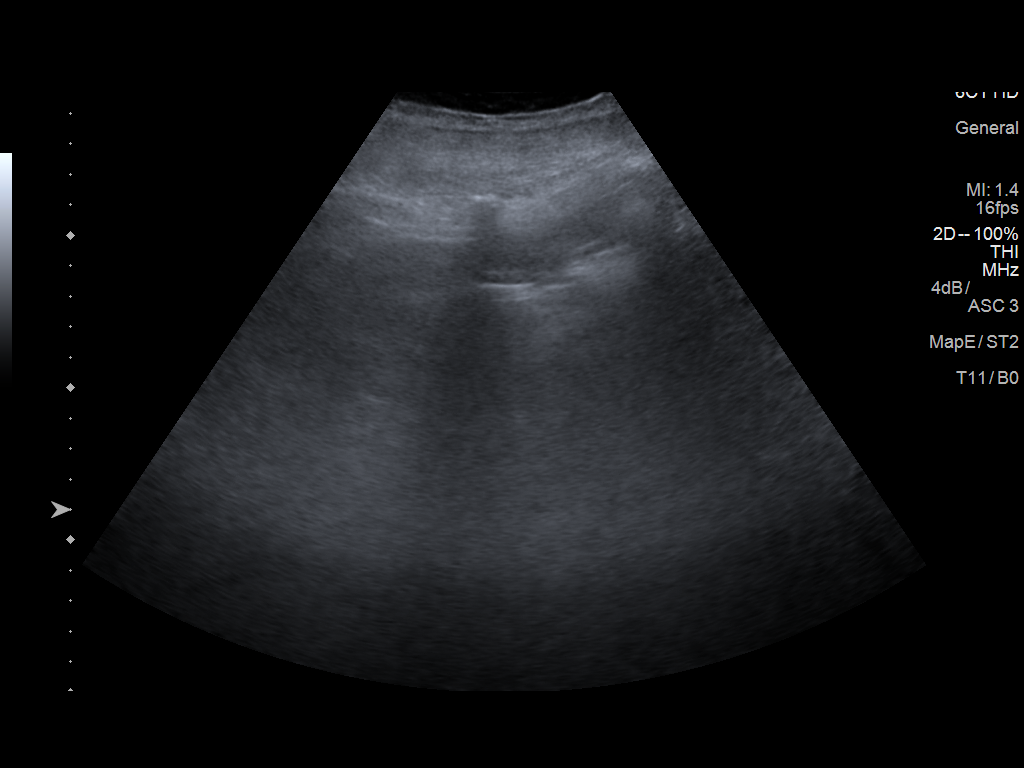
[im 5/5]
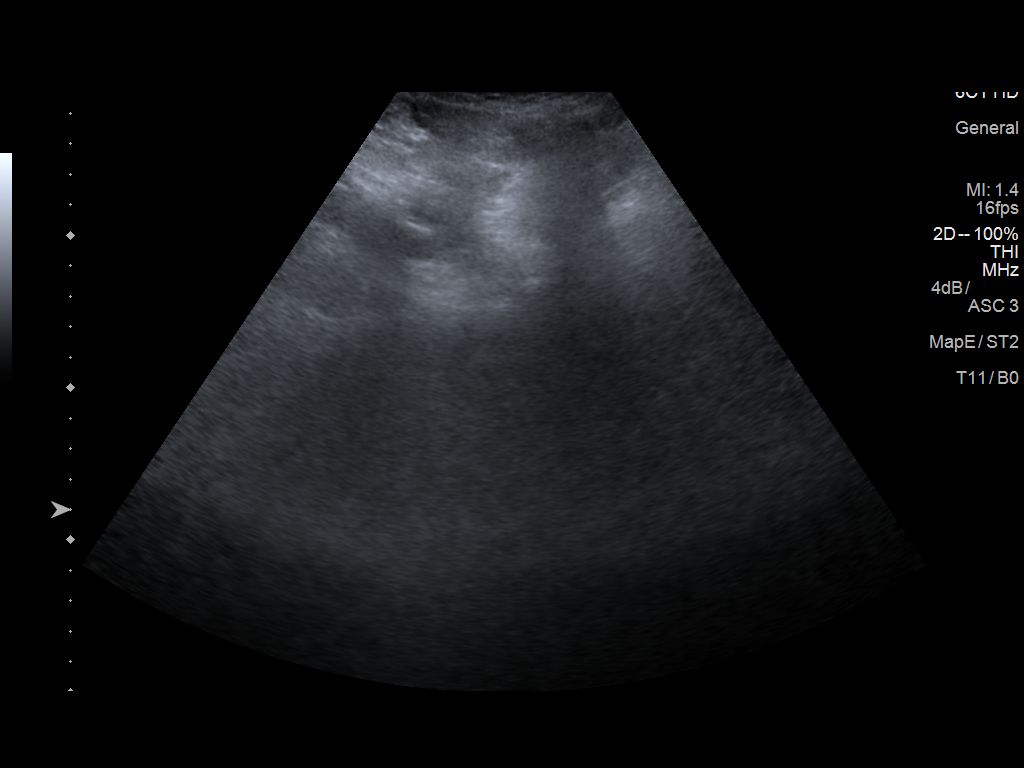

[5 of 5 positions shown; findings below may reference images not displayed]

FINDINGS: No significant ascites visualized within the abdomen or pelvis.
IMPRESSION: Negative for significant ascites by ultrasound.

## 2020-08-20 IMAGING — CT CT HEAD WITHOUT CONTRAST
4 series · 16 of 47 positions shown, 18 images · non-contrast
Comparison: None.

CLINICAL DATA: 72-year-old male with seizure-like activity, altered
mental status.

EXAM:
CT HEAD WITHOUT CONTRAST
TECHNIQUE: Contiguous axial images were obtained from the base of the skull
through the vertex without intravenous contrast.

[Series 3: head without · axial · non-contrast · 0.50mm/px · z∈[-152,-22]mm · 7 of 36 slices shown, 9 images]
[im 5/36  brain]
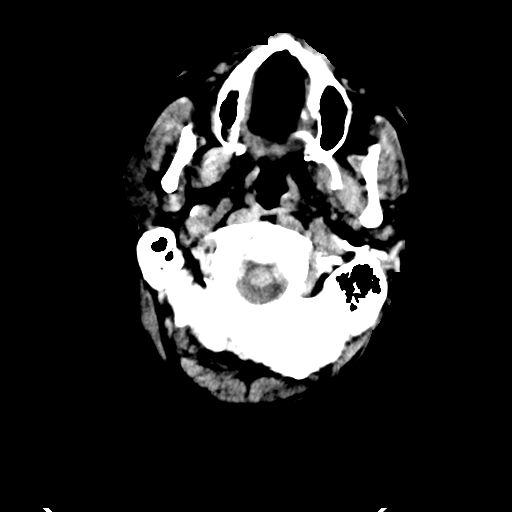
[im 5/36  bone]
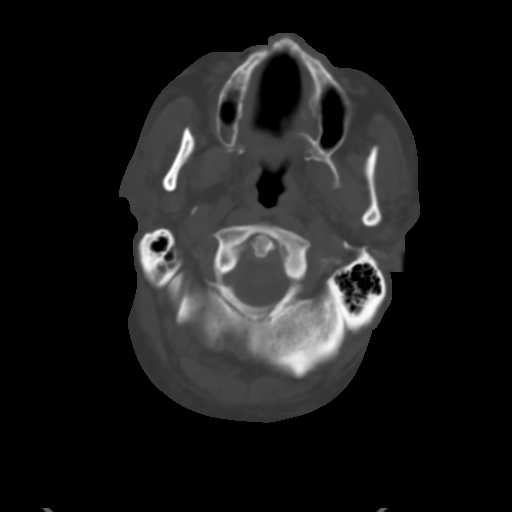
[im 9/36  brain]
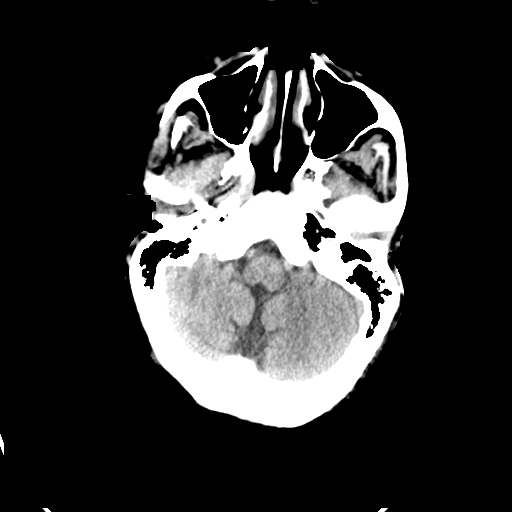
[im 14/36  brain]
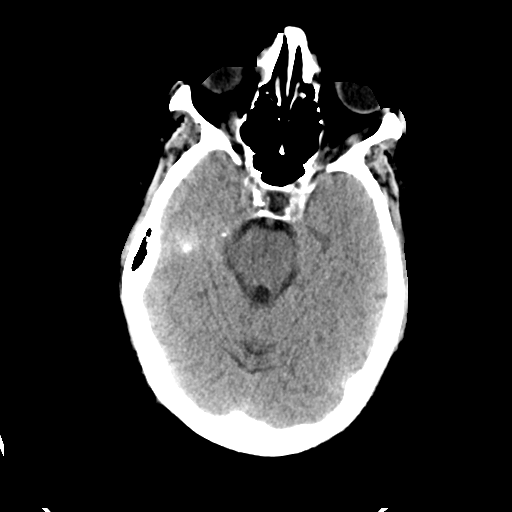
[im 18/36  brain]
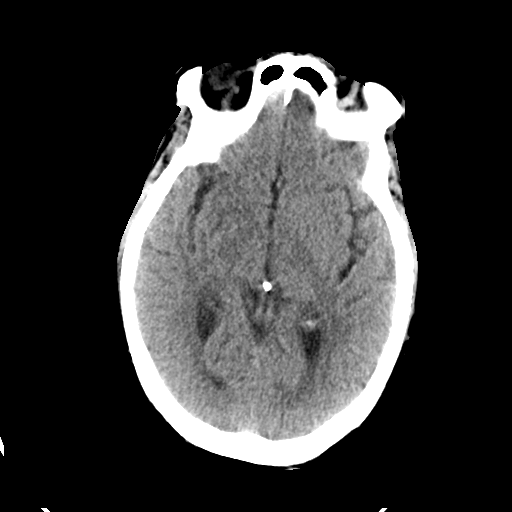
[im 22/36  brain]
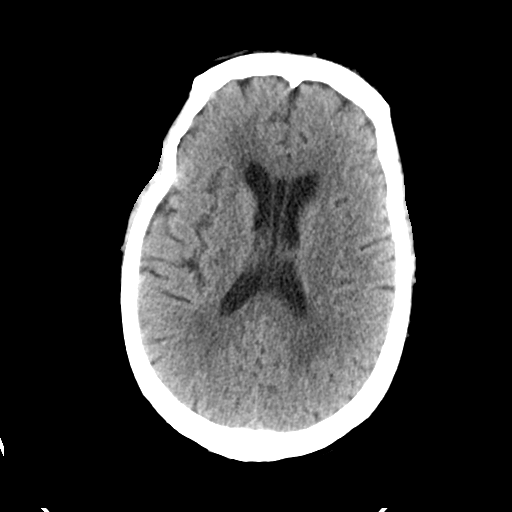
[im 22/36  bone]
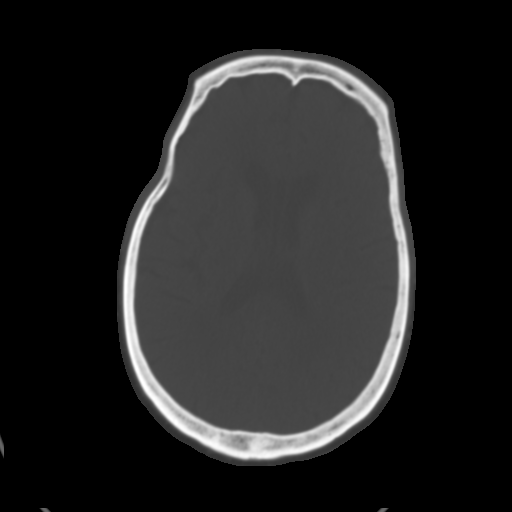
[im 27/36  brain]
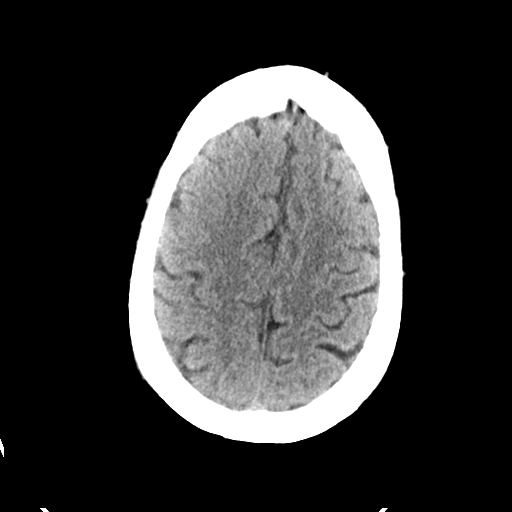
[im 31/36  brain]
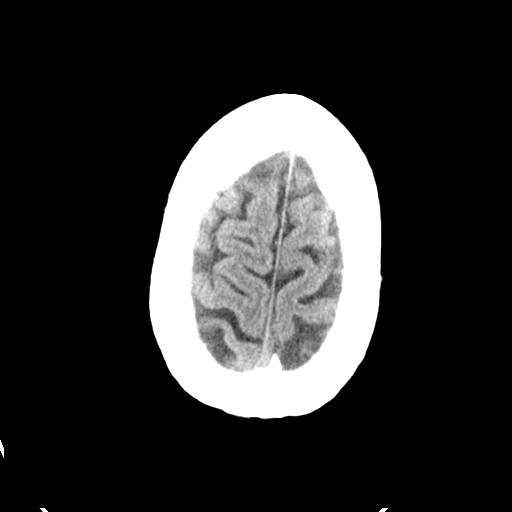

[Series 4: head bone · axial · 0.50mm/px · z∈[-156,-120]mm · 3 of 89 slices shown]
[im 9/89  bone]
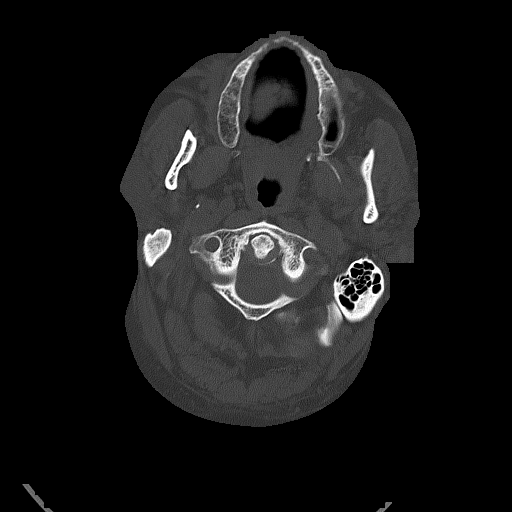
[im 18/89  bone]
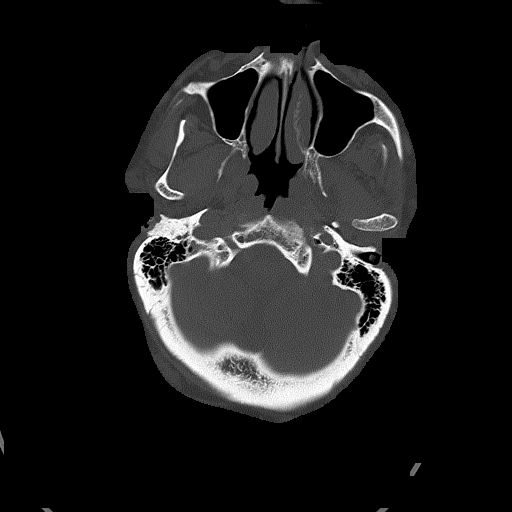
[im 27/89  bone]
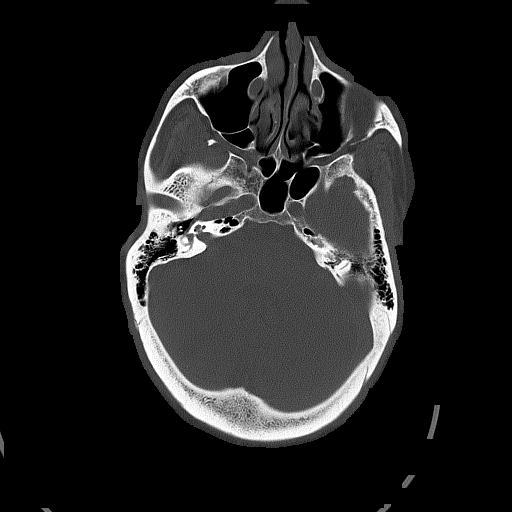

[Series 5: head without cor · coronal · non-contrast · 0.35mm/px · 3 of 73 slices shown]
[im 25/73  brain]
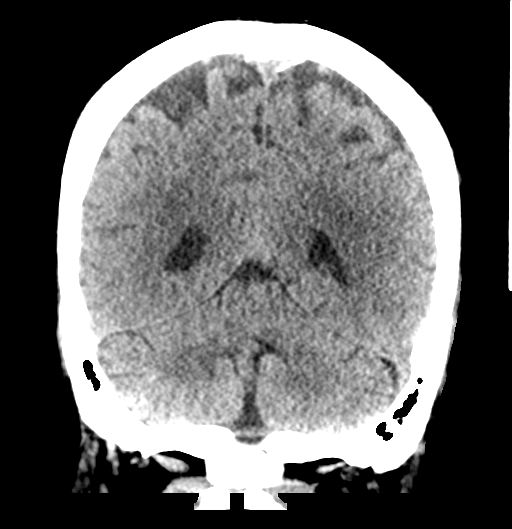
[im 33/73  brain]
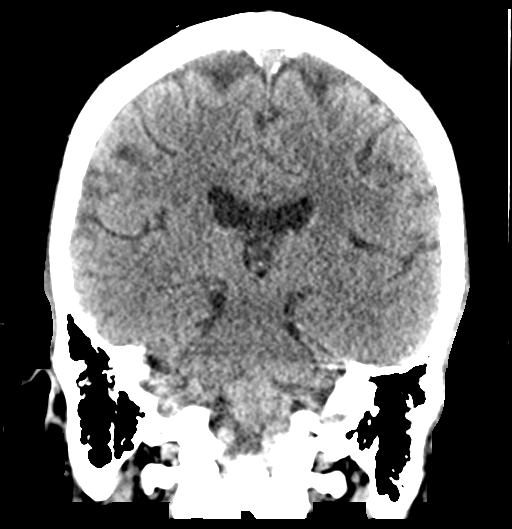
[im 41/73  brain]
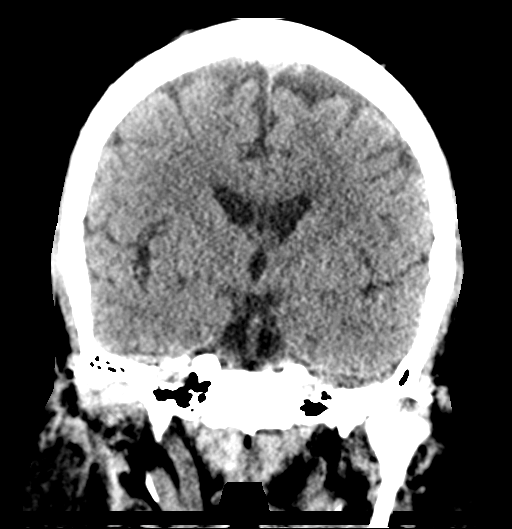

[Series 6: head without sag · sagittal · non-contrast · 0.39mm/px · 3 of 58 slices shown]
[im 20/58  brain]
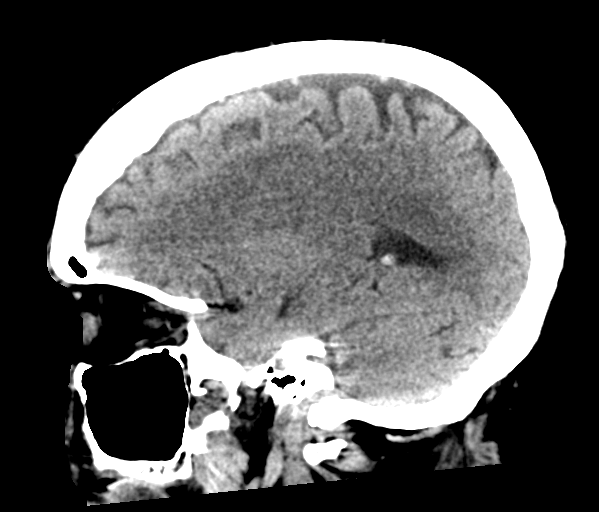
[im 29/58  brain]
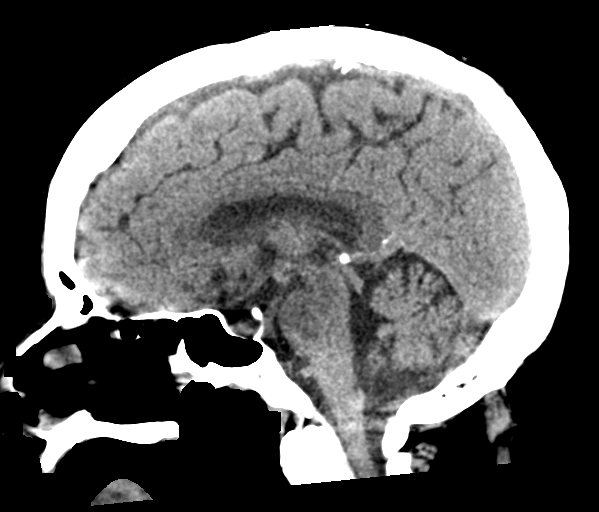
[im 39/58  brain]
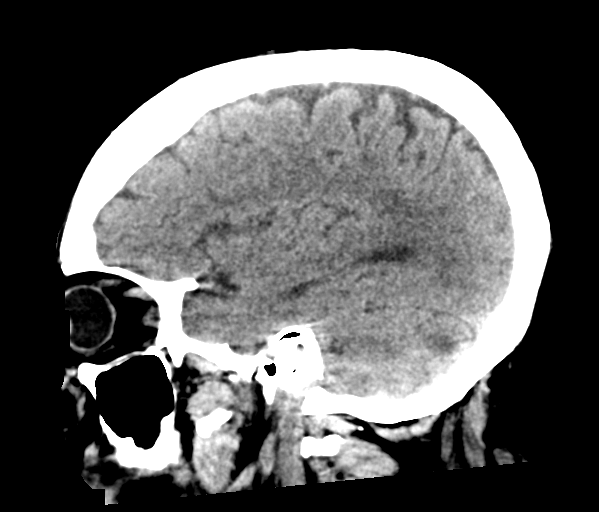

[16 of 47 positions shown; findings below may reference images not displayed]

FINDINGS: Brain: Linear hypodensities in both cerebellar hemispheres most
compatible with small infarcts, the larger is on the right, and
appears more age indeterminate. The smaller area on the left appears
chronic.

Comparatively mild cerebral white matter hypodensity in both
hemispheres. No cortical encephalomalacia identified. Deep gray
matter nuclei are within normal limits for age.

No midline shift, ventriculomegaly, mass effect, evidence of mass
lesion, intracranial hemorrhage or evidence of cortically based
acute infarction. Cavum septum pellucidum, normal variant.

Vascular: Calcified atherosclerosis at the skull base. No suspicious
intracranial vascular hyperdensity.

Skull: No acute osseous abnormality identified.

Sinuses/Orbits: Paranasal sinuses and mastoids are well pneumatized.

Other: Visualized orbits and scalp soft tissues are within normal
limits.
IMPRESSION: 1. Small bilateral cerebellar infarcts, probably chronic although
that on the right is age indeterminate.
2. Largely normal for age noncontrast CT appearance of the cerebral
hemispheres and no other acute intracranial abnormality.

## 2020-08-22 IMAGING — MR MRI HEAD WITHOUT CONTRAST
9 of 11 series · 33 of 48 positions shown · non-contrast
Comparison: 03/18/2019 CT head

CLINICAL DATA: 72 y/o M; altered level of consciousness,
unexplained. Abnormal CT head.

EXAM:
MRI HEAD WITHOUT CONTRAST
TECHNIQUE: Multiplanar, multiecho pulse sequences of the brain and surrounding
structures were obtained without intravenous contrast.

[Series 3: DWI · axial · 3.0mm · 0.94mm/px · z∈[-102,+74]mm · 8 of 122 slices shown (1 of 2)]
[im 1/122]
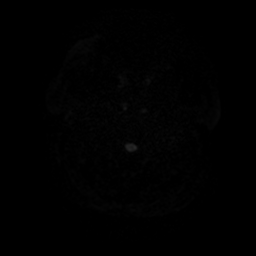
[im 18/122]
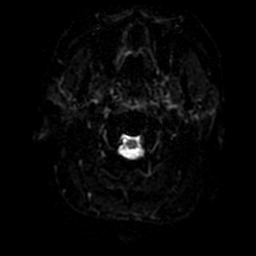
[im 35/122]
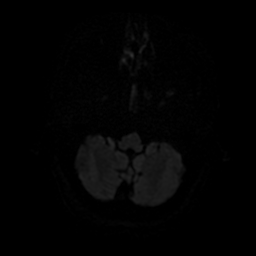
[im 52/122]
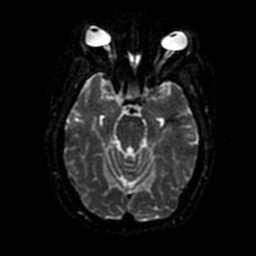
[im 70/122]
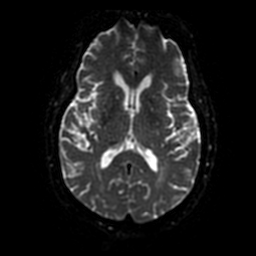
[im 87/122]
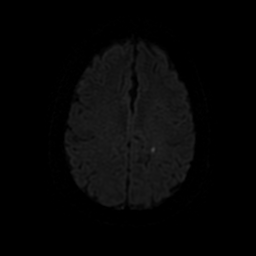
[im 104/122]
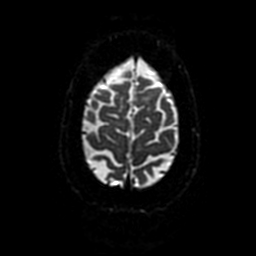
[im 122/122]
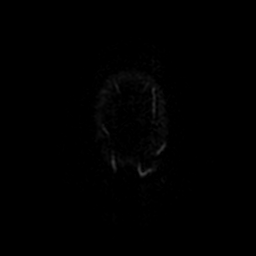

[Series 4: DWI · coronal · 4.0mm · 0.94mm/px · 5 of 80 slices shown (2 of 2)]
[im 1/80]
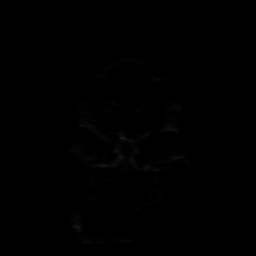
[im 20/80]
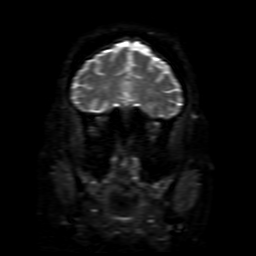
[im 40/80]
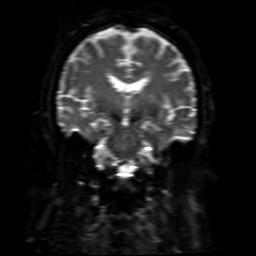
[im 60/80]
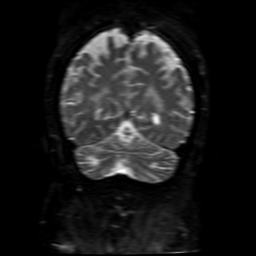
[im 80/80]
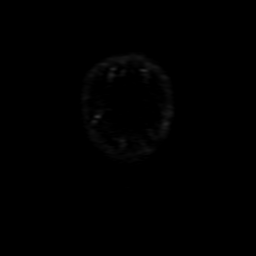

[Series 5: FLAIR · sagittal · 5.0mm · 0.49mm/px · 2 of 23 slices shown (1 of 2)]
[im 1/23]
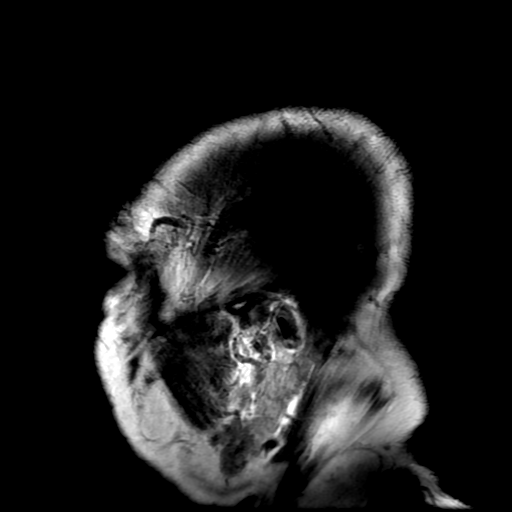
[im 23/23]
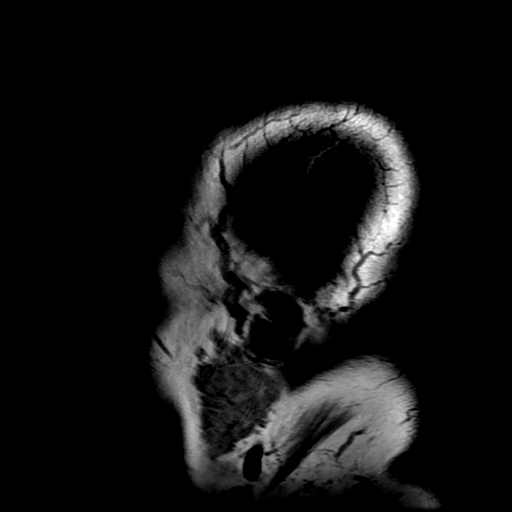

[Series 6: SWI · axial · 3.0mm · 0.47mm/px · z∈[-80,+11]mm · 5 of 108 slices shown]
[im 1/108]
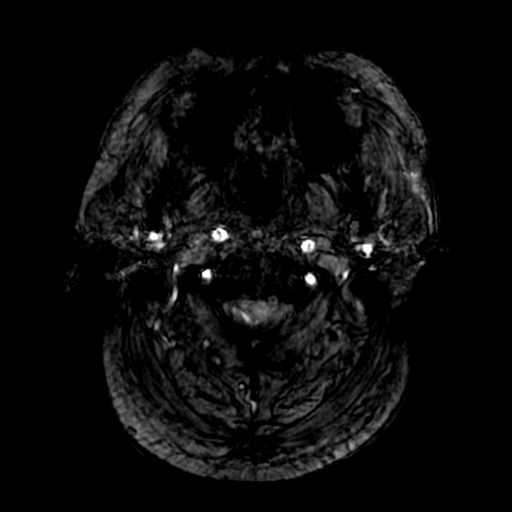
[im 16/108]
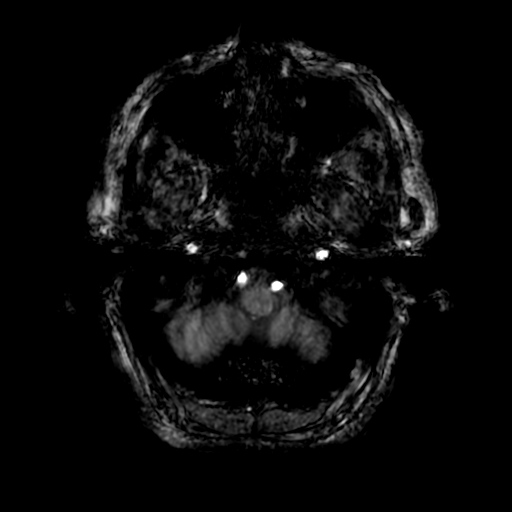
[im 31/108]
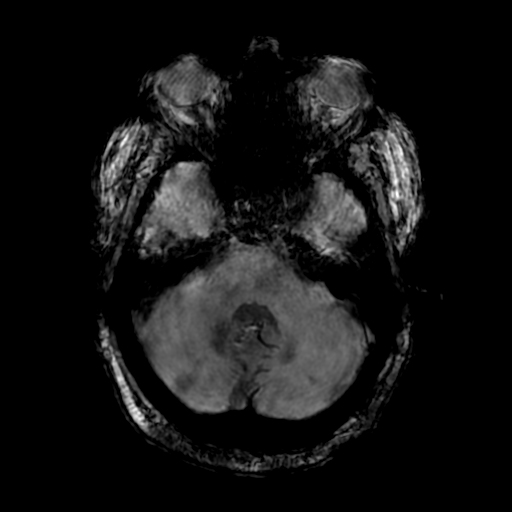
[im 46/108]
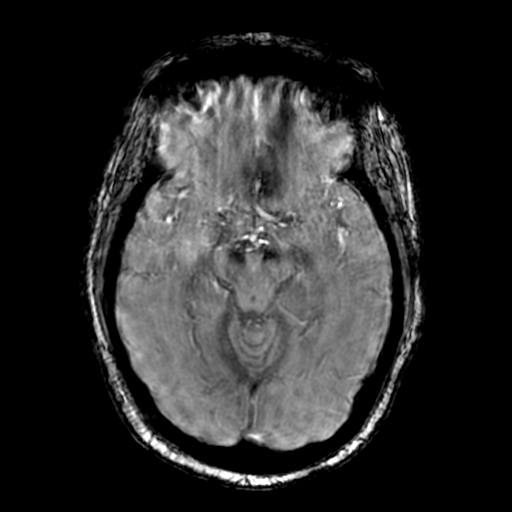
[im 62/108]
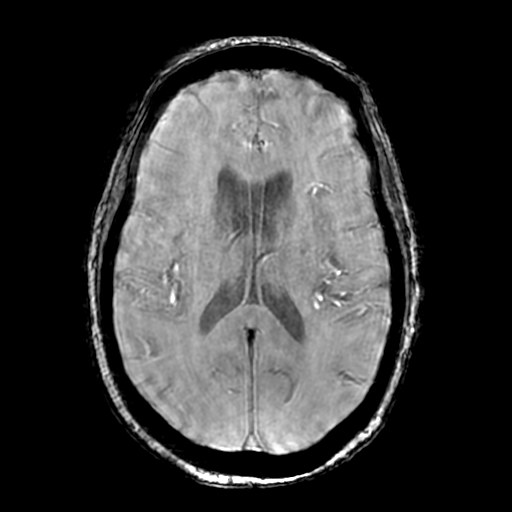

[Series 8: T2 · axial · 5.0mm · 0.47mm/px · z∈[-97,+68]mm · 2 of 29 slices shown (1 of 2)]
[im 1/29]
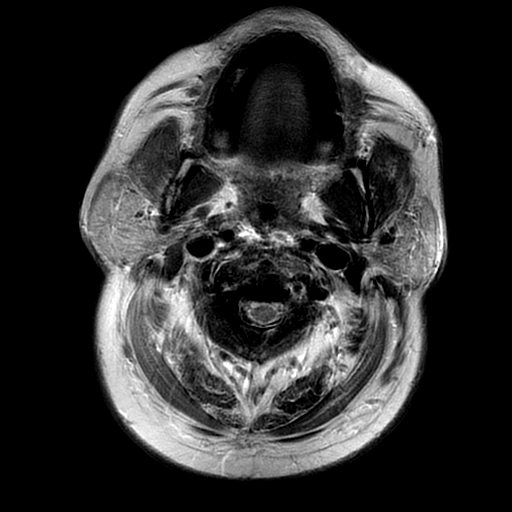
[im 29/29]
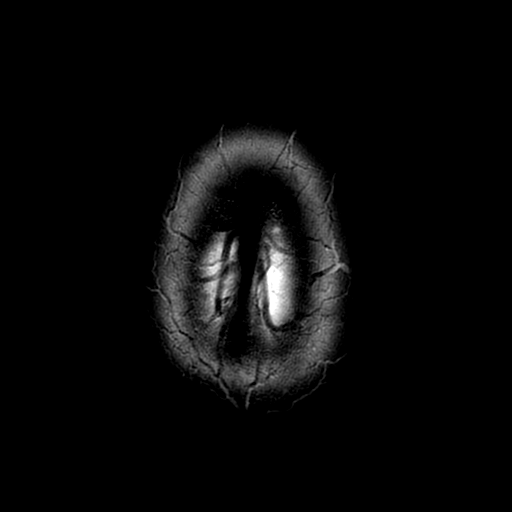

[Series 9: FLAIR · axial · 3.0mm · 0.47mm/px · z∈[-97,+68]mm · 2 of 29 slices shown (2 of 2)]
[im 1/29]
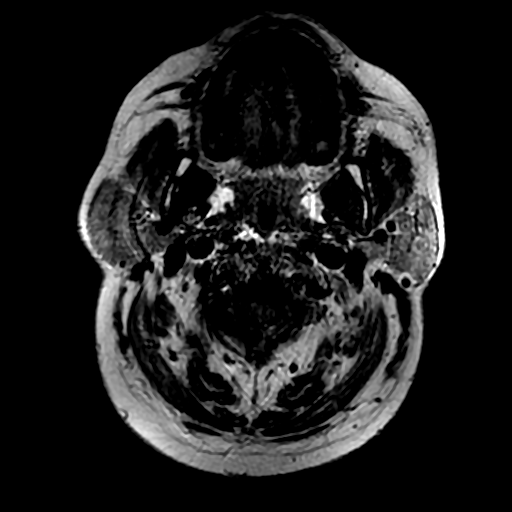
[im 29/29]
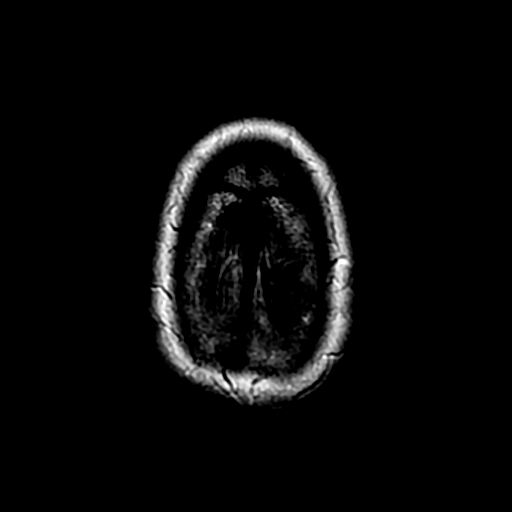

[Series 11: T2 · coronal · 5.0mm · 0.39mm/px · 2 of 27 slices shown (2 of 2)]
[im 1/27]
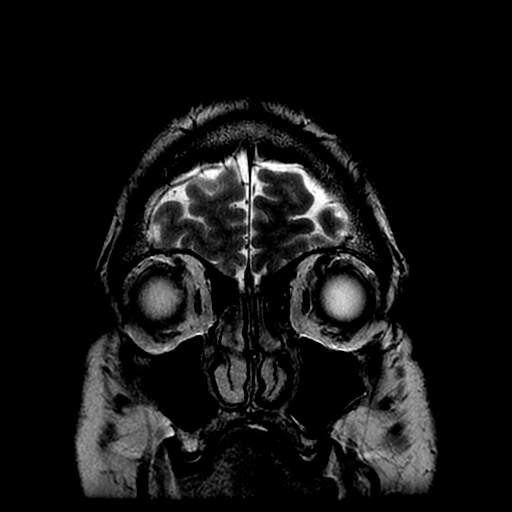
[im 27/27]
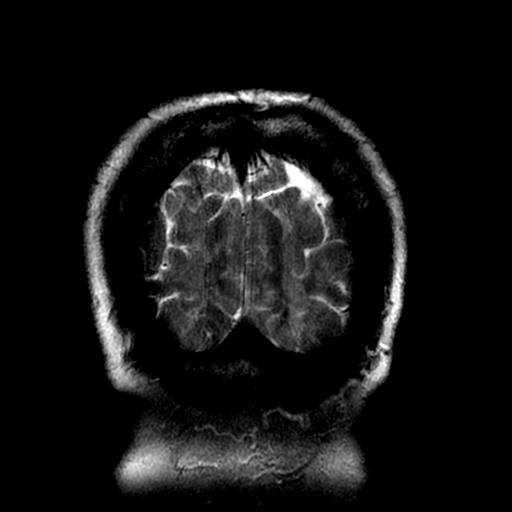

[Series 350: ADC · axial · 3.0mm · 0.94mm/px · z∈[-102,+74]mm · 4 of 61 slices shown (1 of 2)]
[im 1/61]
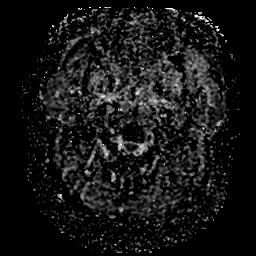
[im 21/61]
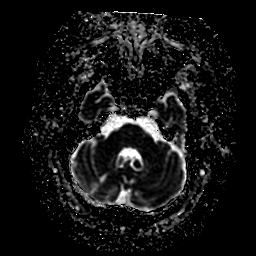
[im 41/61]
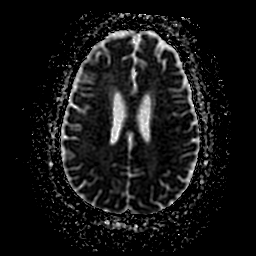
[im 61/61]
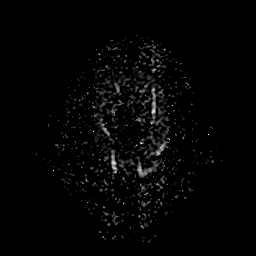

[Series 450: ADC · coronal · 4.0mm · 0.94mm/px · 3 of 40 slices shown (2 of 2)]
[im 1/40]
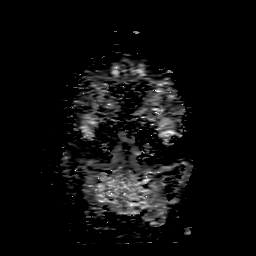
[im 20/40]
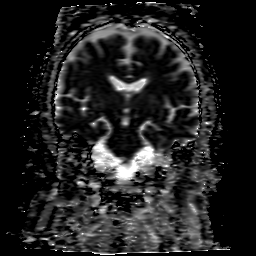
[im 40/40]
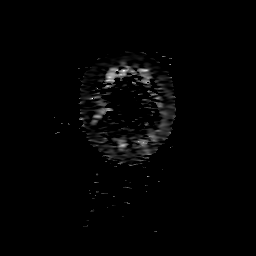

[33 of 48 positions shown; findings below may reference images not displayed]

FINDINGS: Brain: Small group of subcentimeter foci of reduced diffusion within
left posterior frontal lobe subcortical white matter compatible with
acute/early subacute infarction. No associated hemorrhage or mass
effect.

Very small chronic infarctions are present within the bilateral
cerebellar hemispheres. Early confluent nonspecific T2 FLAIR
hyperintensities in subcortical and periventricular white matter as
well as the pons are compatible with moderate chronic microvascular
ischemic changes. Mild volume loss of the brain. Cavum septum
pellucidum.

Vascular: Normal flow voids.

Skull and upper cervical spine: Normal marrow signal.

Sinuses/Orbits: Negative.

Other: None.
IMPRESSION: 1. Small group of subcentimeter acute/early subacute infarctions
within the left posterior frontal lobe subcortical white matter. No
associated hemorrhage or mass effect.
2. Moderate chronic microvascular ischemic changes and mild volume
loss of the brain. Very small chronic infarctions within the
bilateral cerebellar hemispheres.

These results will be called to the ordering clinician or
representative by the Radiologist Assistant, and communication
documented in the PACS or zVision Dashboard.

## 2020-08-23 ENCOUNTER — Other Ambulatory Visit (HOSPITAL_COMMUNITY): Payer: No Typology Code available for payment source

## 2020-08-27 DIAGNOSIS — J9621 Acute and chronic respiratory failure with hypoxia: Secondary | ICD-10-CM | POA: Diagnosis not present

## 2020-08-27 DIAGNOSIS — J449 Chronic obstructive pulmonary disease, unspecified: Secondary | ICD-10-CM | POA: Diagnosis not present

## 2020-08-27 DIAGNOSIS — R269 Unspecified abnormalities of gait and mobility: Secondary | ICD-10-CM | POA: Diagnosis not present

## 2020-08-27 DIAGNOSIS — J441 Chronic obstructive pulmonary disease with (acute) exacerbation: Secondary | ICD-10-CM | POA: Diagnosis not present

## 2020-08-27 IMAGING — DX PORTABLE CHEST - 1 VIEW
1 series · 1 of 1 positions shown · non-contrast
Comparison: 03/08/2019

CLINICAL DATA: Cough

EXAM:
PORTABLE CHEST 1 VIEW

[chest]
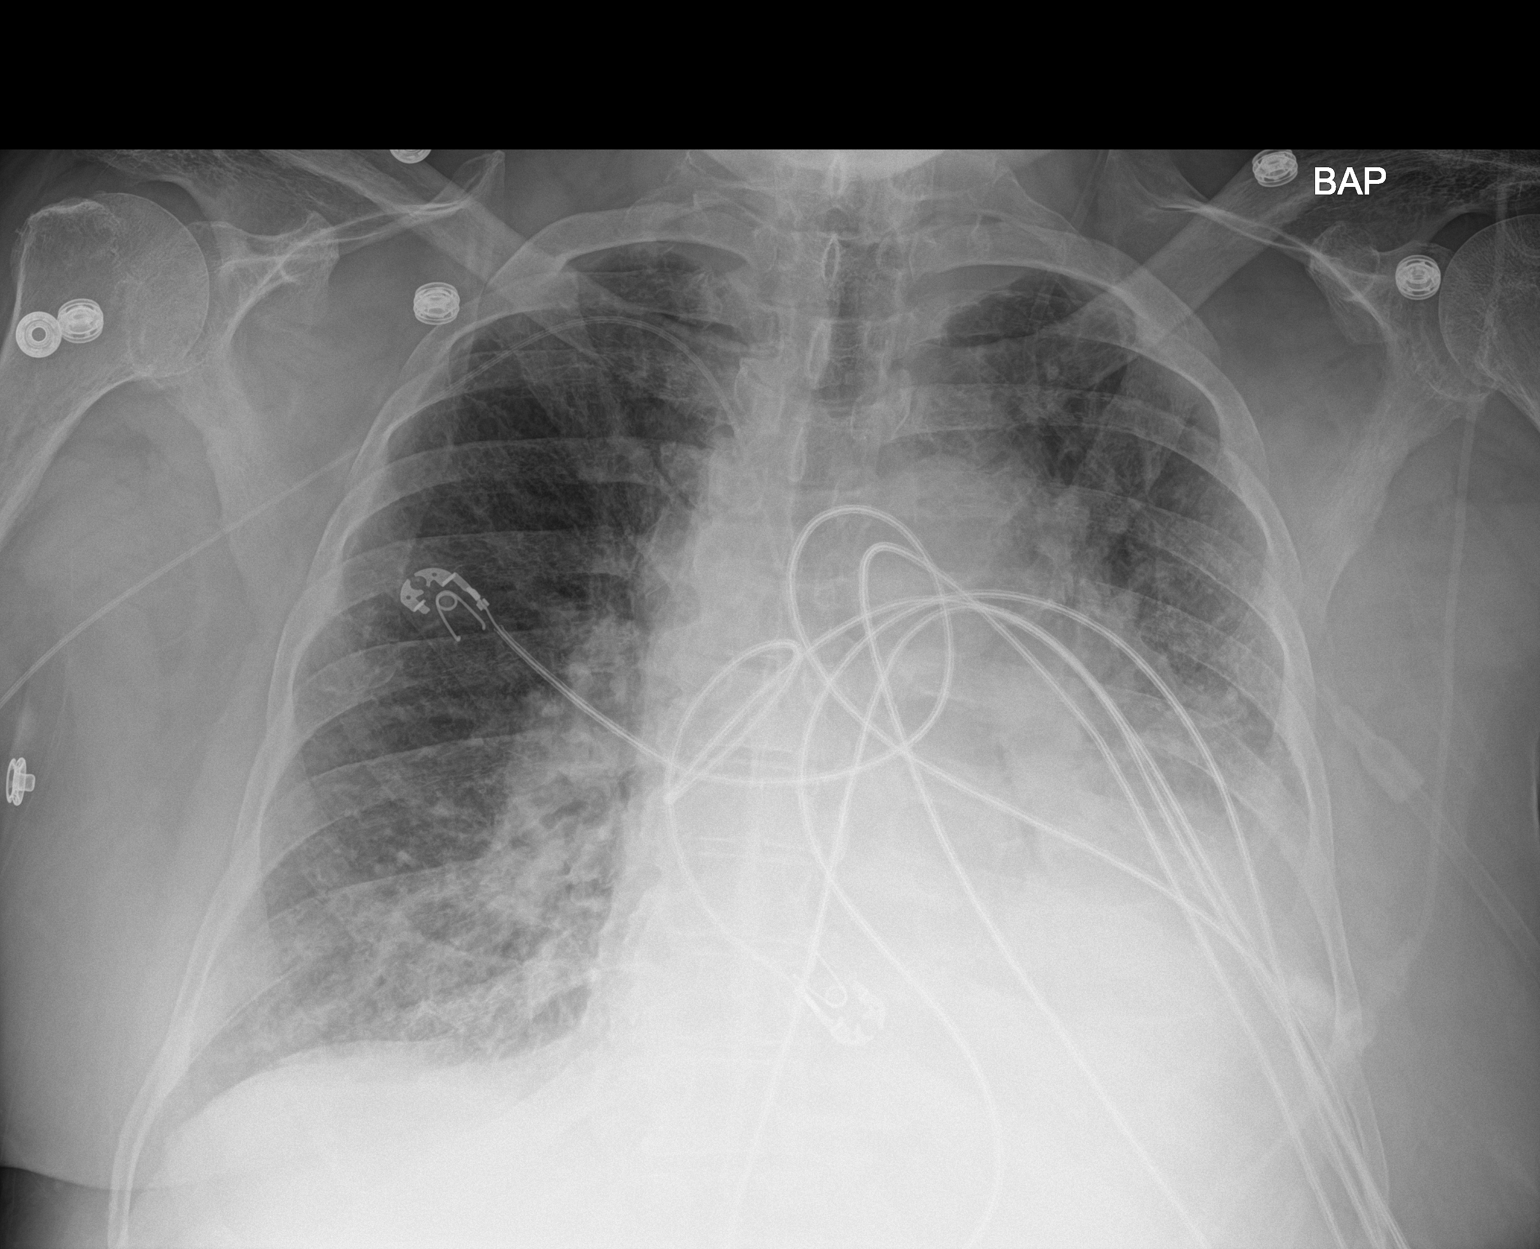

[1 of 1 positions shown; findings below may reference images not displayed]

FINDINGS: The right-sided PICC line tip terminates over the proximal SVC. The
cardiac silhouette is enlarged. Diffuse hazy bilateral airspace
opacities are again noted. There is a dense retrocardiac opacity.
Bilateral pleural effusions are noted. There is no pneumothorax.
IMPRESSION: 1. Cardiomegaly with findings suspicious for underlying pulmonary
edema. An atypical infectious process can have a similar appearance.
2. Retrocardiac opacity favored to represent atelectasis with
infiltrate not excluded.
3. Small bilateral pleural effusions.
4. Right-sided PICC line tip terminates over the SVC.

## 2020-08-28 ENCOUNTER — Ambulatory Visit (HOSPITAL_COMMUNITY)
Admission: RE | Admit: 2020-08-28 | Discharge: 2020-08-28 | Disposition: A | Discharge status: Home / Self Care | Payer: Medicare HMO | Source: Ambulatory Visit | Attending: Cardiology | Admitting: Cardiology

## 2020-08-28 ENCOUNTER — Other Ambulatory Visit: Payer: Self-pay

## 2020-08-28 ENCOUNTER — Encounter (HOSPITAL_COMMUNITY): Payer: Self-pay | Admitting: Cardiology

## 2020-08-28 VITALS — BP 140/60 | HR 68 | Wt 249.0 lb

## 2020-08-28 DIAGNOSIS — E1122 Type 2 diabetes mellitus with diabetic chronic kidney disease: Secondary | ICD-10-CM | POA: Insufficient documentation

## 2020-08-28 DIAGNOSIS — I5022 Chronic systolic (congestive) heart failure: Secondary | ICD-10-CM

## 2020-08-28 DIAGNOSIS — Z794 Long term (current) use of insulin: Secondary | ICD-10-CM | POA: Diagnosis not present

## 2020-08-28 DIAGNOSIS — Z87891 Personal history of nicotine dependence: Secondary | ICD-10-CM | POA: Insufficient documentation

## 2020-08-28 DIAGNOSIS — G4733 Obstructive sleep apnea (adult) (pediatric): Secondary | ICD-10-CM | POA: Insufficient documentation

## 2020-08-28 DIAGNOSIS — N184 Chronic kidney disease, stage 4 (severe): Secondary | ICD-10-CM | POA: Diagnosis not present

## 2020-08-28 DIAGNOSIS — I5042 Chronic combined systolic (congestive) and diastolic (congestive) heart failure: Secondary | ICD-10-CM | POA: Insufficient documentation

## 2020-08-28 DIAGNOSIS — J449 Chronic obstructive pulmonary disease, unspecified: Secondary | ICD-10-CM | POA: Diagnosis not present

## 2020-08-28 DIAGNOSIS — Z8674 Personal history of sudden cardiac arrest: Secondary | ICD-10-CM | POA: Insufficient documentation

## 2020-08-28 DIAGNOSIS — I130 Hypertensive heart and chronic kidney disease with heart failure and stage 1 through stage 4 chronic kidney disease, or unspecified chronic kidney disease: Secondary | ICD-10-CM | POA: Insufficient documentation

## 2020-08-28 DIAGNOSIS — Z9119 Patient's noncompliance with other medical treatment and regimen: Secondary | ICD-10-CM | POA: Diagnosis not present

## 2020-08-28 DIAGNOSIS — Z7901 Long term (current) use of anticoagulants: Secondary | ICD-10-CM | POA: Insufficient documentation

## 2020-08-28 DIAGNOSIS — Z79899 Other long term (current) drug therapy: Secondary | ICD-10-CM | POA: Diagnosis not present

## 2020-08-28 DIAGNOSIS — Z8249 Family history of ischemic heart disease and other diseases of the circulatory system: Secondary | ICD-10-CM | POA: Insufficient documentation

## 2020-08-28 DIAGNOSIS — Z8673 Personal history of transient ischemic attack (TIA), and cerebral infarction without residual deficits: Secondary | ICD-10-CM | POA: Insufficient documentation

## 2020-08-28 DIAGNOSIS — J440 Chronic obstructive pulmonary disease with acute lower respiratory infection: Secondary | ICD-10-CM | POA: Insufficient documentation

## 2020-08-28 DIAGNOSIS — I2510 Atherosclerotic heart disease of native coronary artery without angina pectoris: Secondary | ICD-10-CM | POA: Insufficient documentation

## 2020-08-28 DIAGNOSIS — I480 Paroxysmal atrial fibrillation: Secondary | ICD-10-CM | POA: Diagnosis not present

## 2020-08-28 DIAGNOSIS — Z9981 Dependence on supplemental oxygen: Secondary | ICD-10-CM | POA: Insufficient documentation

## 2020-08-28 DIAGNOSIS — E785 Hyperlipidemia, unspecified: Secondary | ICD-10-CM | POA: Insufficient documentation

## 2020-08-28 LAB — COMPREHENSIVE METABOLIC PANEL
ALT: 12 U/L (ref 0–44)
AST: 19 U/L (ref 15–41)
Albumin: 3.1 g/dL — ABNORMAL LOW (ref 3.5–5.0)
Alkaline Phosphatase: 75 U/L (ref 38–126)
Anion gap: 11 (ref 5–15)
BUN: 24 mg/dL — ABNORMAL HIGH (ref 8–23)
CO2: 24 mmol/L (ref 22–32)
Calcium: 9.2 mg/dL (ref 8.9–10.3)
Chloride: 105 mmol/L (ref 98–111)
Creatinine, Ser: 2.98 mg/dL — ABNORMAL HIGH (ref 0.61–1.24)
GFR, Estimated: 22 mL/min — ABNORMAL LOW (ref >60)
Glucose, Bld: 107 mg/dL — ABNORMAL HIGH (ref 70–99)
Potassium: 4.1 mmol/L (ref 3.5–5.1)
Sodium: 140 mmol/L (ref 135–145)
Total Bilirubin: 0.7 mg/dL (ref 0.3–1.2)
Total Protein: 6.9 g/dL (ref 6.5–8.1)

## 2020-08-28 LAB — LIPID PANEL
Cholesterol: 113 mg/dL (ref 0–200)
HDL: 38 mg/dL — ABNORMAL LOW (ref >40)
LDL Cholesterol: 51 mg/dL (ref 0–99)
Total CHOL/HDL Ratio: 3 RATIO
Triglycerides: 121 mg/dL (ref <150)
VLDL: 24 mg/dL (ref 0–40)

## 2020-08-28 LAB — TSH: TSH: 1.576 u[IU]/mL (ref 0.350–4.500)

## 2020-08-28 MED ORDER — METOLAZONE 2.5 MG PO TABS: 2.5000 mg | ORAL_TABLET | ORAL | 3 refills | Status: DC

## 2020-08-28 MED ORDER — AMIODARONE HCL 200 MG PO TABS: 100.0000 mg | ORAL_TABLET | Freq: Every day | ORAL | 3 refills | Status: DC

## 2020-08-28 NOTE — Progress Notes (Signed)
ReDS Vest / Clip - 08/28/20 1500      ReDS Vest / Clip   Station Marker D    Ruler Value 32    ReDS Value Range Low volume    ReDS Actual Value 35

## 2020-08-28 NOTE — Patient Instructions (Signed)
Make sure you are taking Amiodarone 100 mg (1/2 tab) Daily  Start Metolazone 2.5 mg EVERY Wednesday   Every time you take Metolazone take an extra 20 meq of Potassium  Labs done today, we will call you for abnormal results  Your physician recommends that you return for lab work in: 2 weeks  Your physician recommends that you schedule a follow-up appointment in: 4-6 weeks  If you have any questions or concerns before your next appointment please send [place] a message through [place] or call our office at [phone].    TO LEAVE A MESSAGE FOR THE NURSE SELECT OPTION 2, PLEASE LEAVE A MESSAGE INCLUDING:  YOUR NAME  DATE OF BIRTH  CALL BACK NUMBER  REASON FOR CALL**this is important as we prioritize the call backs  YOU WILL RECEIVE A CALL BACK THE SAME DAY AS LONG AS YOU CALL BEFORE 4:00 PM  At the [facility], you and your health needs are our priority. As part of our continuing mission to provide you with exceptional heart care, we have created designated Provider Care Teams. These Care Teams include your primary Cardiologist (physician) and Advanced Practice Providers (APPs- Physician Assistants and Nurse Practitioners) who all work together to provide you with the care you need, when you need it.   You may see any of the following providers on your designated Care Team at your next follow up:  Dr Glori Bickers  Dr Haynes Kerns, NP  Lyda Jester, [place]  Audry Riles, PharmD   Please be sure to bring in all your medications bottles to every appointment.

## 2020-08-29 NOTE — Progress Notes (Signed)
Heart Failure  Note Date:  08/29/2020   ID:  Nicholas Caldwell, DOB Apr 19, 1946, MRN [id number]   Provider location: [place] Type of Visit: Established patient  PCP:  [name], MD  HF Cardiology: Dr [name]   History of Present Illness: Nicholas Caldwell is a 74 y.o. male with a history of chronic systolic CHFdue to NICM (EF 40-45%)with moderate RV failure, COPD on 3 L O2, DM, HTN, OHS/OSA on CPAP, PAF s/p DCCV, hyperlipidemia, CAD, and noncompliance.He has multiple admissions due to HF and respiratory failure.   Most recently admitted from 03/07/19-03/31/19 with massive volume overload with severe RV failure, AFL and cardiorenal syndrome. Attempts at diuresis limited by worsening creatinine which peaked at 2.6. Treated with milrinone but still unable to get CVP below 15. Hospital course c/b severe non-compliance with high fluid intake, PAFL, penile wound, acute versus subacute CVA and Citrobacter UTI. Underwent DC-CV of AFL into NSR.  D/c'd home on 5/28 with weight of 270 and creatinine 1.99. Readmitted6/6/2020with recurrent hypoxic/hypercarbic respiratory failure. (ABG 7.27/66/55/86%). Found to have severe hyperkalemia (K>7.5) and large left pleural effusion. Weightwasup about 10 pounds since d/c.Diuresed with IV lasix and later transitioned to torsemide 80 mg twice a day.  Discharge weight on 04/14/2019 was 251 pounds.   Admitted 5/0/03 with A/C systolic/diastolic heart failure. Bisoprolol was stopped on admit and not continued on discharge. Diuresed with with IV lasix and transitioned back to torsemide 80 mg twice a day + metolazone 4 times a week.   Admitted 7/0/48 with A/C systolic/diastolic heart failure + AECOPD. Diuresed with IV lasix and discharged on torsemide 80 mg twice a day + metolazone 4x a week. Treated with steroids and antibiotics. He was placed on low dose bisoprolol.  Palliative Care consulted. MOST form was introduced. He wished to continue  full code.   He was admitted in 10/20 with RLL PNA (treated with ceftriaxone), CHF exacerbation, and right pleural effusion.  He had right-sided thoracentesis.  He was admitted again in 10/20 with PEA arrest, thought to be due to overuse of narcotic pain meds and underuse of Bipap.  He was noted to be in atrial fibrillation and amiodarone was started, he went back into NSR. He was diuresed for CHF.   Today he returns for followup of CHF.  He continues to use home oxygen during the day and Bipap at night.  Weight up 2 lbs.  He has not been using metolazone, continues on torsemide 80 mg bid.  Stable breathing, gets around house without dyspnea but short of breath with longer distances.  Mild orthopnea. No chest pain.      ECG (personally reviewed): NSR, 1st degree AVB, iRBBB  REDS clip 35%  Labs (10/20): K 3.3, creatinine 1.7 Labs (11/20): K 3.9, creatinine 2.21, hgb 10.1 Labs (2/21): K 4.1, creatinine 4.01 => 3.48, hgb 8.8, BNP 1176 Labs (9/21): K 3.1, creatinine 3.64  PMH: 1. Chronic systolic CHF: Nonischemic cardiomyopathy, diagnosed in 2016.  - LHC (1/16) with mild nonobstructive CAD.  - Echo (12/17): EF 35-40%, mildly dilated RV with mild to moderately decreased RV systolic function, PASP 57 mmHg.  - Echo (3/20): EF 45-50%, mild LVH, D-shaped septum with severe RV dilation and RV function.  - TEE (3/20): EF 40-45%, moderate LVH, severe RV dilation with moderate dysfunction.  - RHC (2/20): mean RA 10, PA 52/16 mean 30, mean PCWP 10, CI 3.85, PVR 2.2 WU - Echo (9/20): EF >65%, moderate RV dilation with mildly decreased  systolic function.  - Echo (10/20): EF 60-65%, severe RV dilation with severely decreased RV systolic function.  2. Atrial fibrillation: Paroxysmal. Severe LAE, has seen Dr Rayann Heman and thought to have low chance of success with atrial fibrillation ablation. Tikosyn not used due to long QT. He was on amiodarone in the past but this was stopped due to long QT.  - DCCV 3/20,  5/20.  3. COPD: On 3 L home oxygen.  4. Type II diabetes 5. Hyperlipidemia 6. HTN 7. OHS/OSA: Home oxygen + BiPAP.  8. CAD: LHC (2/20) with 50-60% distal LAD, 50% proximal LCx, 50% proximal RCA (nondominant RCA).  9. CVA: 5/20.  10. CKD: Stage 4.   11. ABIs (9/20): normal.  12. PEA arrest: 10/20, due to overuse of narcotic pain meds and underuse of Bipap.  13. Tremor  Current Outpatient Medications  Medication Sig Dispense Refill  . albuterol (PROVENTIL) (2.5 MG/3ML) 0.083% nebulizer solution Take 3 mLs (2.5 mg total) by nebulization every 4 (four) hours as needed for wheezing or shortness of breath. 75 mL 12  . albuterol (VENTOLIN HFA) 108 (90 Base) MCG/ACT inhaler Inhale 2 puffs into the lungs every 6 (six) hours as needed for wheezing or shortness of breath.    Marland Kitchen apixaban (ELIQUIS) 5 MG TABS tablet Take 1 tablet (5 mg total) by mouth 2 (two) times daily. (Patient taking differently: Take 2.5 mg by mouth 2 (two) times daily. ) 60 tablet 1  . Budeson-Glycopyrrol-Formoterol (BREZTRI AEROSPHERE) 160-9-4.8 MCG/ACT AERO Inhale 2 puffs into the lungs 2 (two) times daily. 10.7 g 11  . Ensure Max Protein (ENSURE MAX PROTEIN) LIQD Take 330 mLs (11 oz total) by mouth daily.    . famotidine (PEPCID) 20 MG tablet Take 1 tablet (20 mg total) by mouth 2 (two) times daily. 60 tablet 2  . FEROSUL 325 (65 Fe) MG tablet Take 325 mg by mouth 2 (two) times daily.    . fluticasone (FLONASE) 50 MCG/ACT nasal spray Place 2 sprays into both nostrils daily as needed for allergies.     Marland Kitchen gabapentin (NEURONTIN) 300 MG capsule Take 1 capsule (300 mg total) by mouth 2 (two) times daily. (Patient taking differently: Take 300 mg by mouth daily. ) 60 capsule 0  . hydrocortisone (ANUSOL-HC) 2.5 % rectal cream Place 1 application rectally 2 (two) times daily as needed for hemorrhoids or itching.     . hydrocortisone 1 % lotion Apply 1 application topically See admin instructions. Apply small amount to back twice daily for  itchy rash as needed    . insulin aspart (NOVOLOG) 100 UNIT/ML injection Inject 4 Units into the skin 3 (three) times daily with meals. 10 mL 0  . insulin aspart protamine - aspart (NOVOLOG MIX 70/30 FLEXPEN) (70-30) 100 UNIT/ML FlexPen Inject 0.1 mLs (10 Units total) into the skin 2 (two) times daily. 15 mL 0  . Insulin Syringe 27G X 1/2" 0.5 ML MISC 100 Syringes by Does not apply route 3 (three) times daily. 100 each 1  . oxybutynin (DITROPAN) 5 MG tablet Take 1 tablet (5 mg total) by mouth 4 (four) times daily. 120 tablet 0  . oxyCODONE-acetaminophen (PERCOCET) 10-325 MG tablet Take 1 tablet by mouth every 6 (six) hours as needed for pain.     . OXYGEN Inhale 3 L into the lungs continuous.     . polyethylene glycol (MIRALAX / GLYCOLAX) 17 g packet Take 17 g by mouth daily as needed. 14 each 0  . potassium chloride SA (  KLOR-CON) 20 MEQ tablet Take 4 tablets (80 mEq total) by mouth daily. With additional 36mq when you take Metolazone. 210 tablet 3  . PRESCRIPTION MEDICATION Cpap    . rosuvastatin (CRESTOR) 20 MG tablet Take 20 mg by mouth at bedtime.    . senna-docusate (SENOKOT-S) 8.6-50 MG tablet Take 1 tablet by mouth at bedtime as needed for mild constipation. 30 tablet 0  . sodium chloride (OCEAN) 0.65 % SOLN nasal spray Place 2 sprays into both nostrils as needed for congestion.     . torsemide (DEMADEX) 20 MG tablet Take 4 tablets (80 mg total) by mouth 2 (two) times daily. 150 tablet 3  . amiodarone (PACERONE) 200 MG tablet Take 0.5 tablets (100 mg total) by mouth daily. 15 tablet 3  . metolazone (ZAROXOLYN) 2.5 MG tablet Take 1 tablet (2.5 mg total) by mouth once a week. Every Wednesday 5 tablet 3   No current facility-administered medications for this encounter.    Allergies:   Bidil [isosorb dinitrate-hydralazine]   Social History:  The patient  reports that he quit smoking about 20 months ago. His smoking use included cigarettes. He has a 34.00 pack-year smoking history. He has  never used smokeless tobacco. He reports that he does not drink alcohol and does not use drugs.   Family History:  The patient's family history includes Heart disease in his father and mother; Heart failure in his mother; Hypertension in his mother and sister.   ROS:  Please see the history of present illness.   All other systems are personally reviewed and negative.  Wt Readings from Last 3 Encounters:  08/28/20 112.9 kg (249 lb)  06/21/20 111.6 kg (246 lb)  05/28/20 (!) 112 kg (247 lb)   Vitals:   08/28/20 1514  BP: 140/60  Pulse: 68  SpO2: 98%    Exam:   General: NAD Neck: JVP 10 cm, no thyromegaly or thyroid nodule.  Lungs: Distant BS. CV: Nondisplaced PMI.  Heart regular S1/S2, no S3/S4, no murmur.  No peripheral edema.  No carotid bruit.  Normal pedal pulses.  Abdomen: Soft, nontender, no hepatosplenomegaly, no distention.  Skin: Intact without lesions or rashes.  Neurologic: Alert and oriented x 3.  Psych: Normal affect. Extremities: No clubbing or cyanosis.  HEENT: Normal.    Recent Labs: 12/05/2019: B Natriuretic Peptide 1,175.8 02/10/2020: Hemoglobin 10.1; Platelets 522 08/28/2020: ALT 12; BUN 24; Creatinine, Ser 2.98; Potassium 4.1; Sodium 140; TSH 1.576  Personally reviewed   Wt Readings from Last 3 Encounters:  08/28/20 112.9 kg (249 lb)  06/21/20 111.6 kg (246 lb)  05/28/20 (!) 112 kg (247 lb)      ASSESSMENT AND PLAN:  1. Chronic systolic => diastolic CHF: With prominent RV failure, likely related to COPD/OSA/OHS. TEE in 3/20 with EF 40-45%, severely dilated RV with moderately decreased systolic function. However, repeat echo in 9/20 showed EF >65% with moderate RV dilation/mildly decreased RV systolic function, and echo in 10/20 showed EF 60-65% with severe RV dilation/severe RV dysfunction. He has had multiple admissions with CHF and COPD exacerbations. CHF is complicated by CKD stage 4.  He is chronically NYHA class III.  Suspect mild volume overload by exam  and REDS clip borderline at 35%.  - Continue torsemide 80 mg bid.  Restart on metolazone 2.5 mg once a week on Wednesdays with extra 20 mEq KCl when metolazone is taken. BMET today and again in 2 wks.  2. Atrial fibrillation: Paroxysmal. He has history of CVA.  He  had DCCV in 3/20 and again in 5/20. He failed amiodarone in the past and cannot take Tikosyn due to baseline prolonged QT interval.  However, he was restarted on amiodarone at 10/20 admission and has been in NSR since then.  - Continue apixaban 5 mg bid.   - Poor ablation candidate with LA size - Continue amiodarone 100 mg daily.  Check LFTs/TSH today.  He will need a regular eye exam.  3. COPD: No longer smokes. Continue home oxygen. CT chest showed emphysema.  - Needs pulmonary followup.  4. OHS/OSA: Continue 3 L oxygen during the day and BIPAP at night.    5. CAD: Nonobstructive disease in 2/20. No chest pain.  - Continue crestor 20 mg daily, check lipids. - He is on apixaban so no ASA.  6. CKD: Stage 4.  BMET today.   Followup with APP in 1 month.   Signed, Loralie Champagne, MD  08/29/2020  [facility] [address] [facility] [place] [address] [phone] (office) [phone] (fax)

## 2020-09-01 ENCOUNTER — Ambulatory Visit: Payer: No Typology Code available for payment source | Attending: Internal Medicine

## 2020-09-01 DIAGNOSIS — Z23 Encounter for immunization: Secondary | ICD-10-CM

## 2020-09-01 NOTE — Progress Notes (Signed)
   Covid-19 Vaccination Clinic  Name:  Chai Routh    MRN: [id number] DOB: Apr 06, 1946  09/01/2020  Mr. Wehrly was observed post Covid-19 immunization for 15 minutes without incident. He was provided with Vaccine Information Sheet and instruction to access the V-Safe system.   Mr. Vandevoort was instructed to call 911 with any severe reactions post vaccine: Marland Kitchen Difficulty breathing  . Swelling of face and throat  . A fast heartbeat  . A bad rash all over body  . Dizziness and weakness

## 2020-09-11 IMAGING — CT CT HEAD WITHOUT CONTRAST
3 series · 15 of 47 positions shown, 18 images · non-contrast
Comparison: CT 03/18/2019 and MRI 03/20/2019

CLINICAL DATA: Altered level of consciousness over the past 2 days.
Bladder infection 2 weeks ago. Not taking antibiotics.

EXAM:
CT HEAD WITHOUT CONTRAST
TECHNIQUE: Contiguous axial images were obtained from the base of the skull
through the vertex without intravenous contrast.

[Series 2: head wo · axial · 0.50mm/px · z∈[+1426,+1571]mm · 9 of 35 slices shown, 12 images]
[im 3/35  brain]
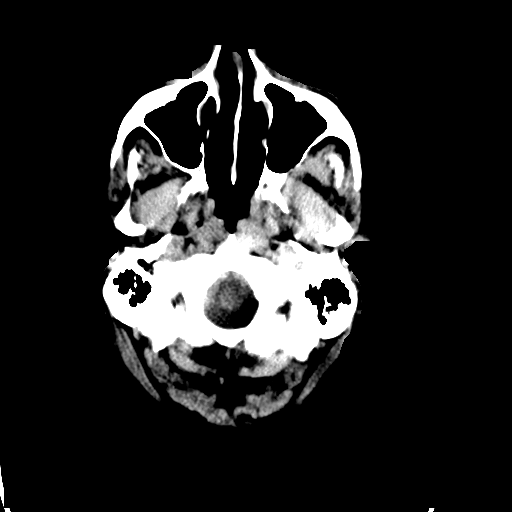
[im 3/35  bone]
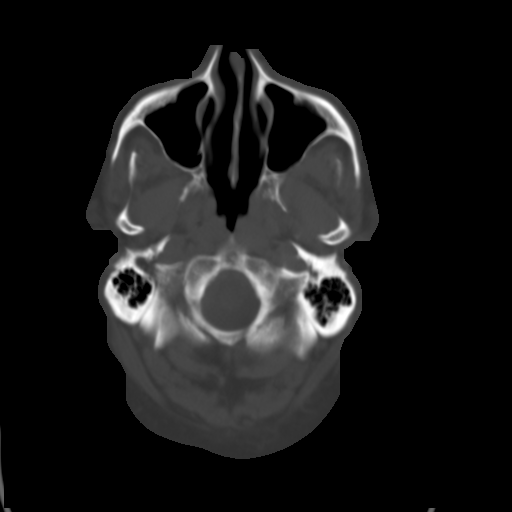
[im 6/35  brain]
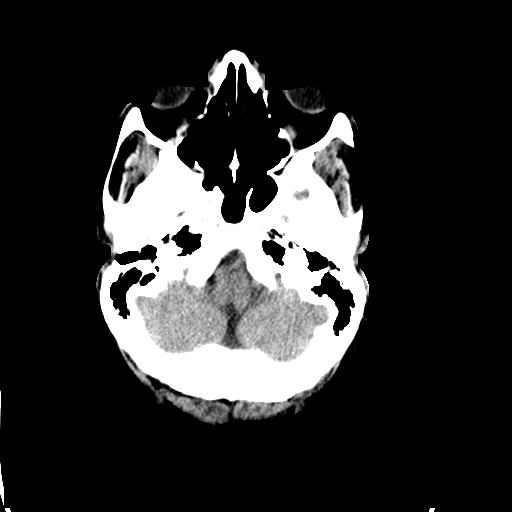
[im 10/35  brain]
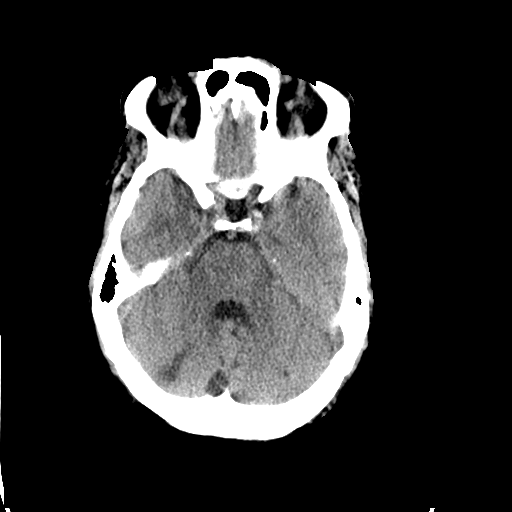
[im 13/35  brain]
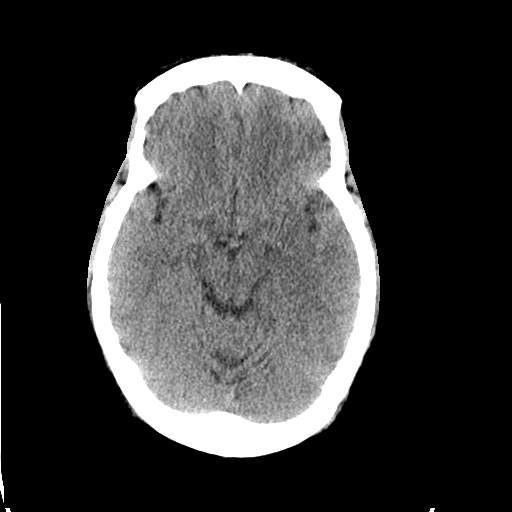
[im 18/35  brain]
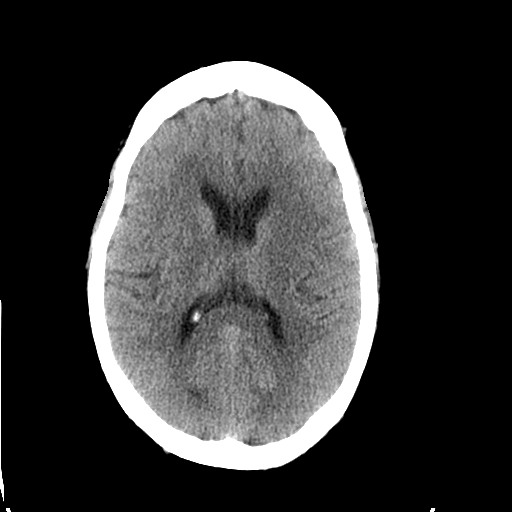
[im 18/35  bone]
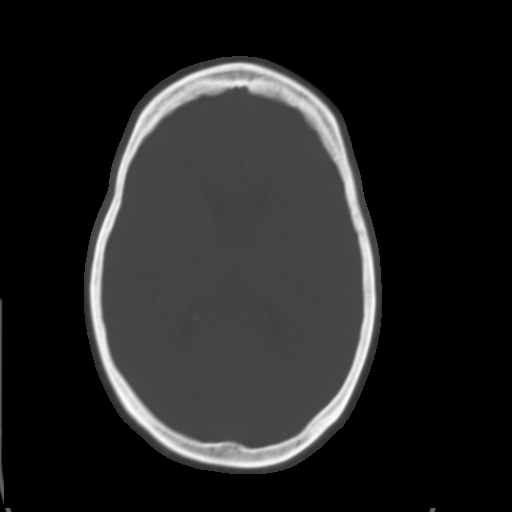
[im 22/35  brain]
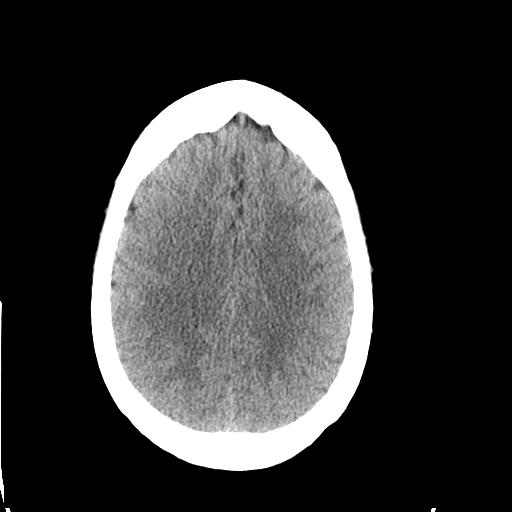
[im 25/35  brain]
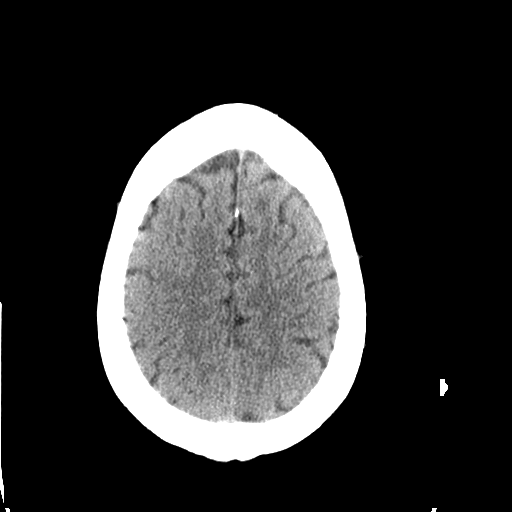
[im 29/35  brain]
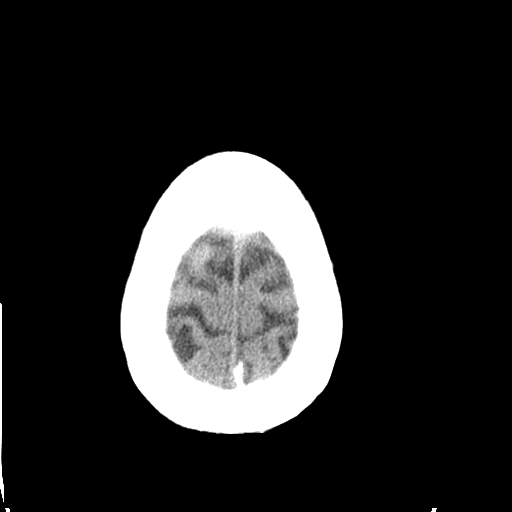
[im 32/35  brain]
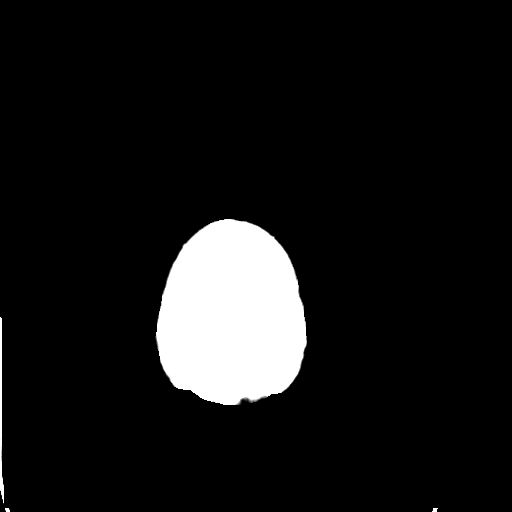
[im 32/35  bone]
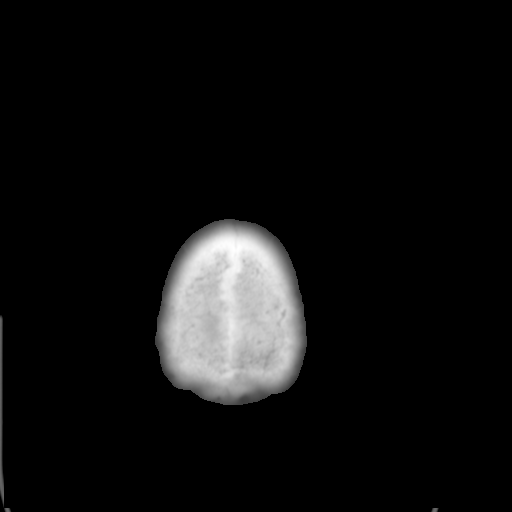

[Series 5: coronal soft tissue · coronal · 0.33mm/px · 3 of 76 slices shown]
[im 26/76  brain]
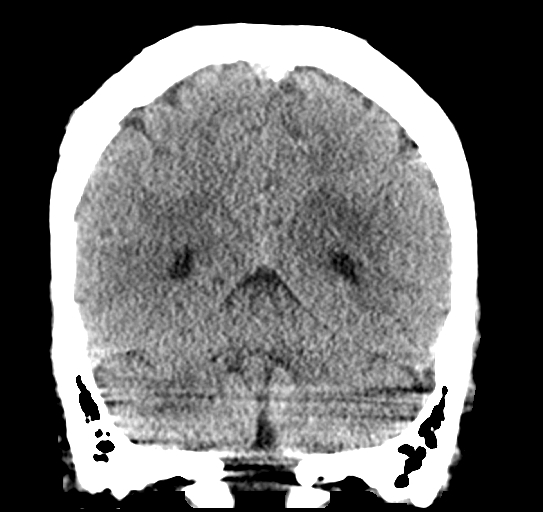
[im 34/76  brain]
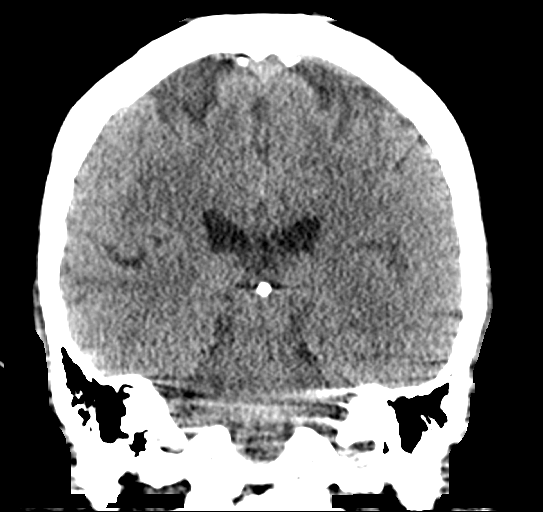
[im 42/76  brain]
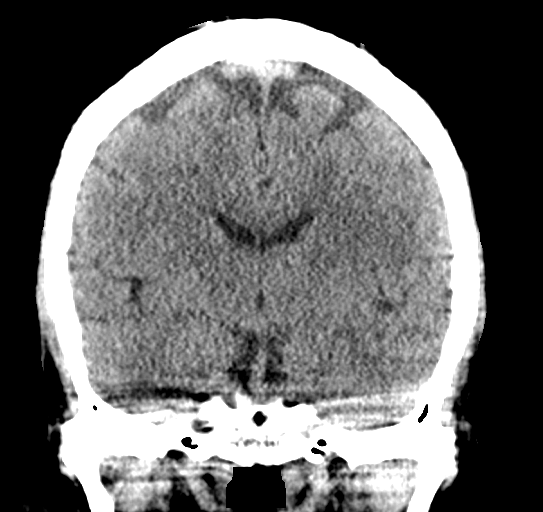

[Series 6: sagittal soft tissue · sagittal · 0.34mm/px · 3 of 55 slices shown]
[im 19/55  brain]
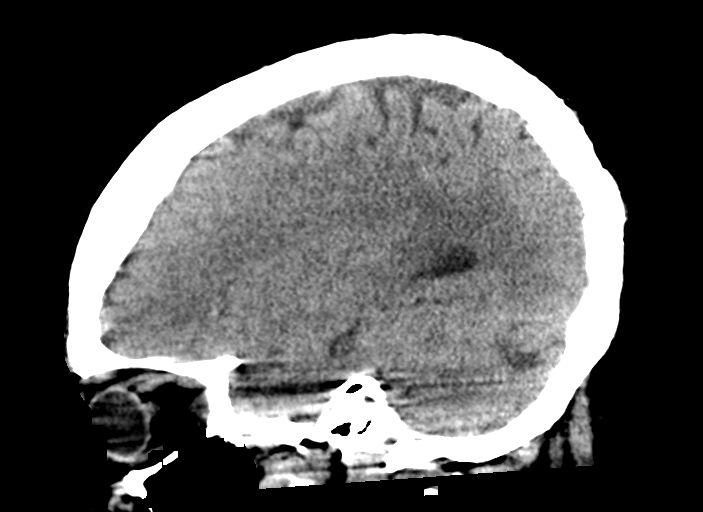
[im 28/55  brain]
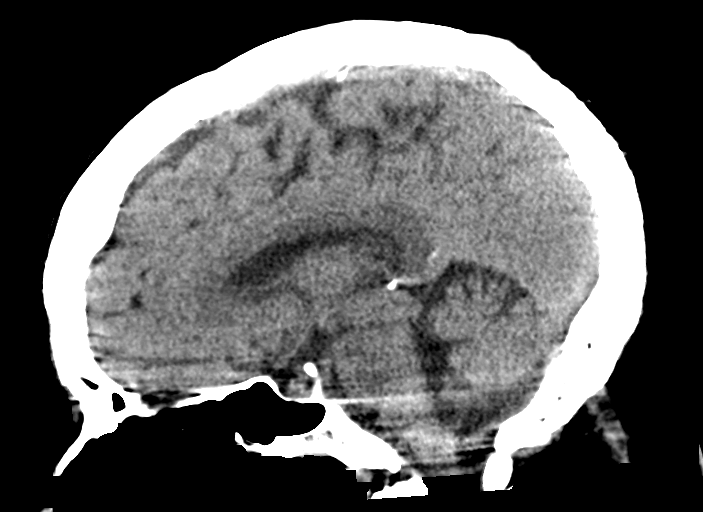
[im 37/55  brain]
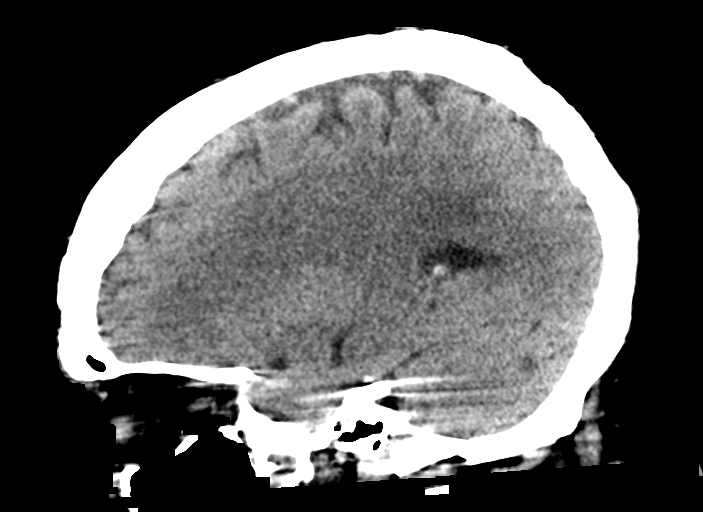

[15 of 47 positions shown; findings below may reference images not displayed]

FINDINGS: Brain: Ventricles, cisterns and other CSF spaces are within normal.
Cavum septum variant is present. Subtle chronic ischemic
microvascular disease. Stable old small cerebellar infarcts. No
mass, mass effect, shift of midline structures or acute hemorrhage.
No evidence of acute infarction.

Vascular: No hyperdense vessel or unexpected calcification.

Skull: Normal. Negative for fracture or focal lesion.

Sinuses/Orbits: Orbits are normal symmetric. Paranasal sinuses are
normal.

Other: None.
IMPRESSION: No acute findings.

Minimal chronic ischemic microvascular disease. Small old lacunar
cerebellar infarcts.

## 2020-09-12 ENCOUNTER — Other Ambulatory Visit (HOSPITAL_COMMUNITY): Payer: No Typology Code available for payment source

## 2020-09-13 ENCOUNTER — Other Ambulatory Visit (HOSPITAL_COMMUNITY): Payer: No Typology Code available for payment source

## 2020-09-13 ENCOUNTER — Inpatient Hospital Stay (HOSPITAL_COMMUNITY): Admission: RE | Admit: 2020-09-13 | Payer: No Typology Code available for payment source | Source: Ambulatory Visit

## 2020-09-14 IMAGING — US ULTRASOUND CHEST
1 series · 14 of 16 positions shown · non-contrast
Comparison: 04/09/2019

CLINICAL DATA: Pleural effusions by chest x-ray assess for
thoracentesis

EXAM:
ULTRASOUND OF CHEST SOFT TISSUES
TECHNIQUE: Ultrasound examination of the chest wall soft tissues was performed
in the area of clinical concern.

[Series 1: ir (id) (id)/(id)/(id) ir · 14 of 18 slices shown]
[im 1/18]
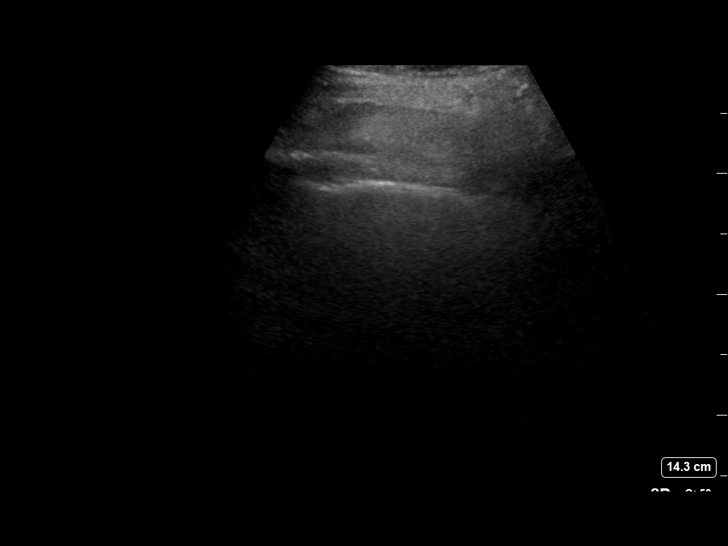
[im 2/18]
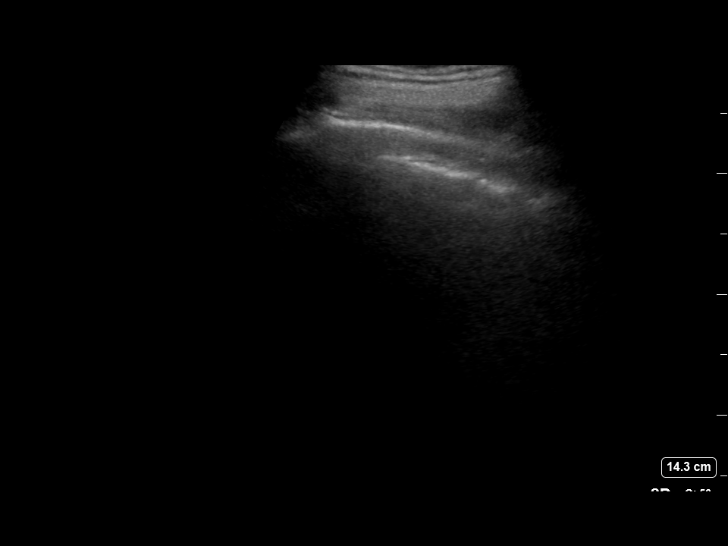
[im 3/18]
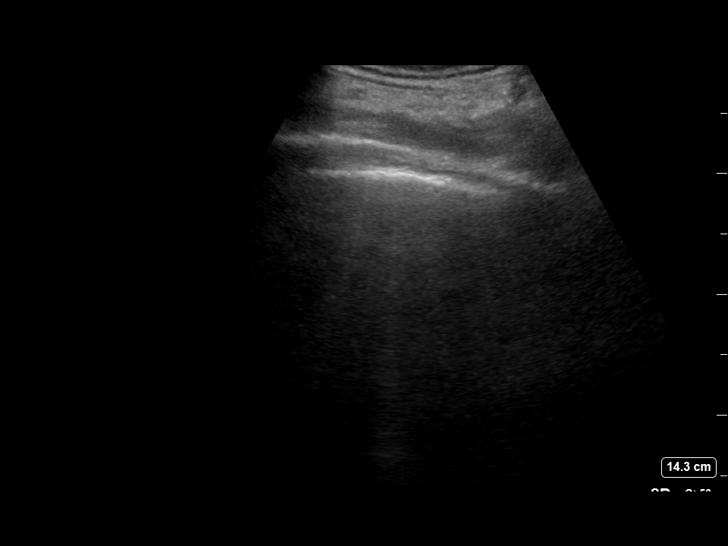
[im 5/18]
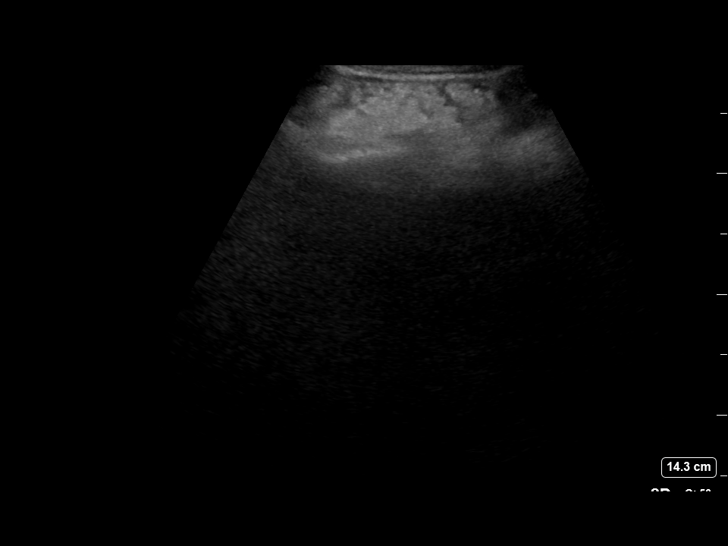
[im 6/18]
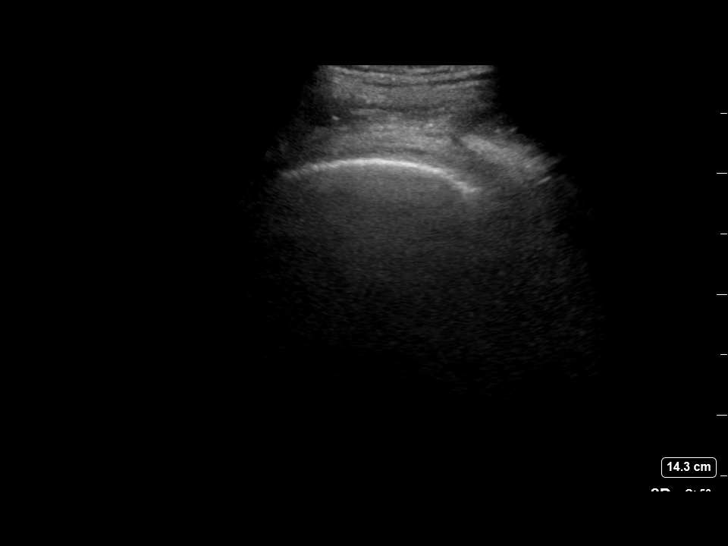
[im 7/18]
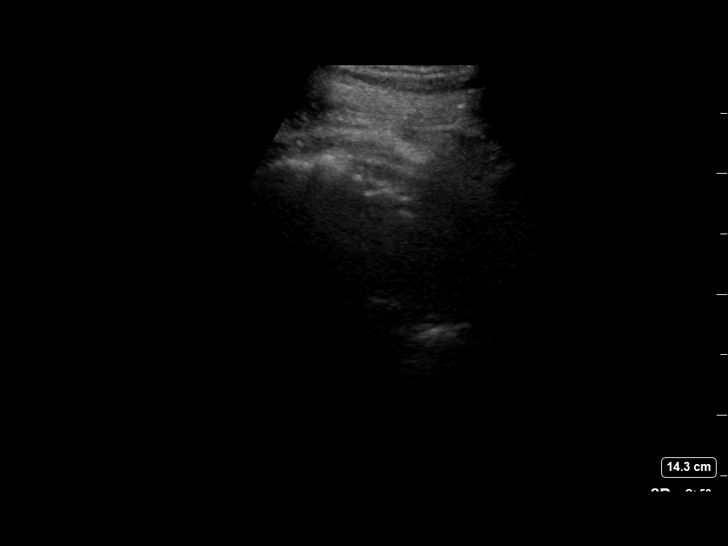
[im 8/18]
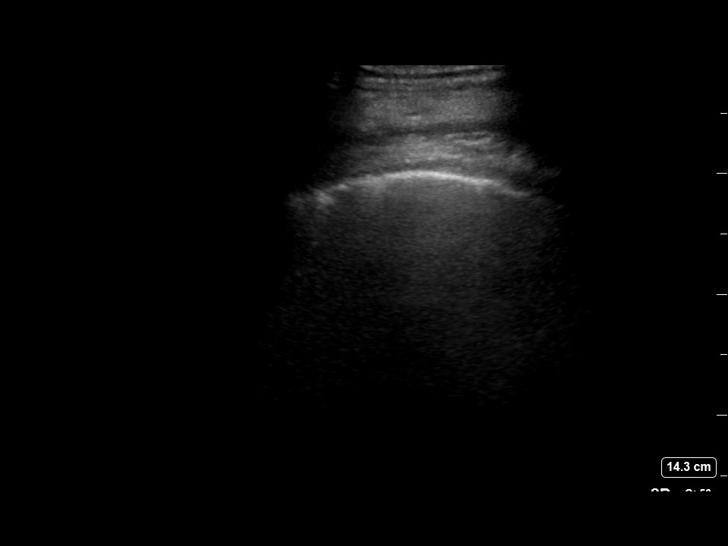
[im 10/18]
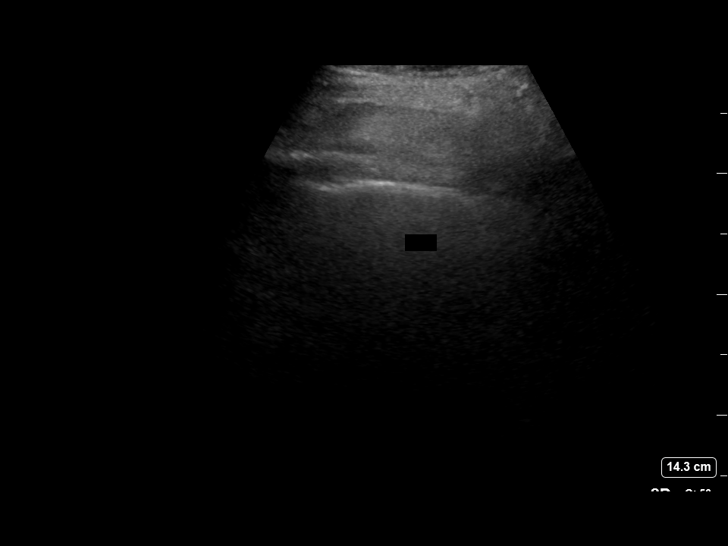
[im 11/18]
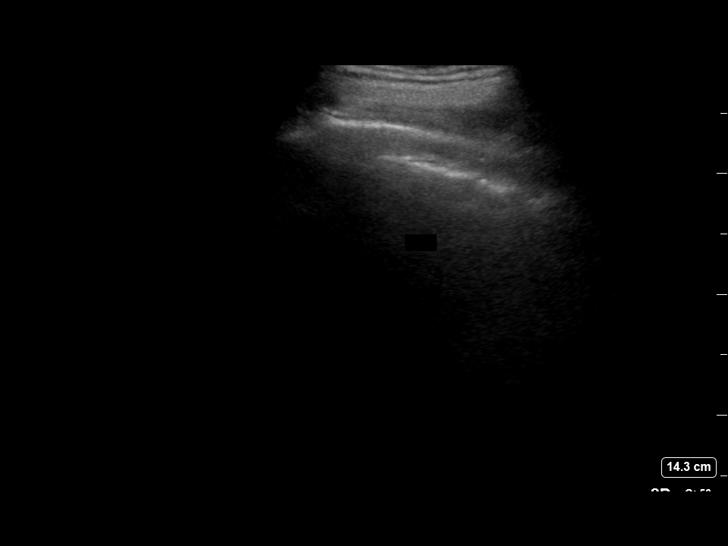
[im 12/18]
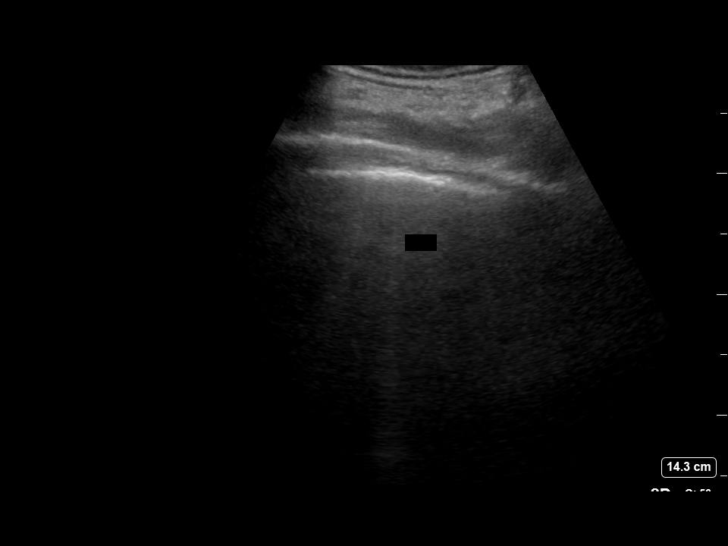
[im 14/18]
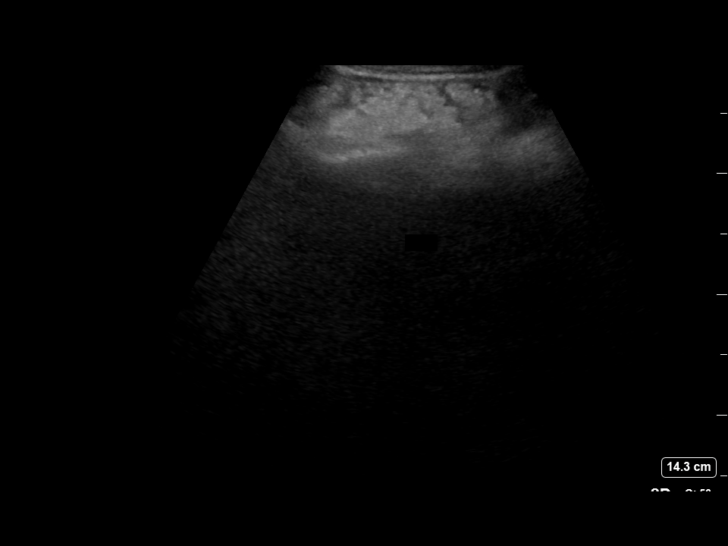
[im 15/18]
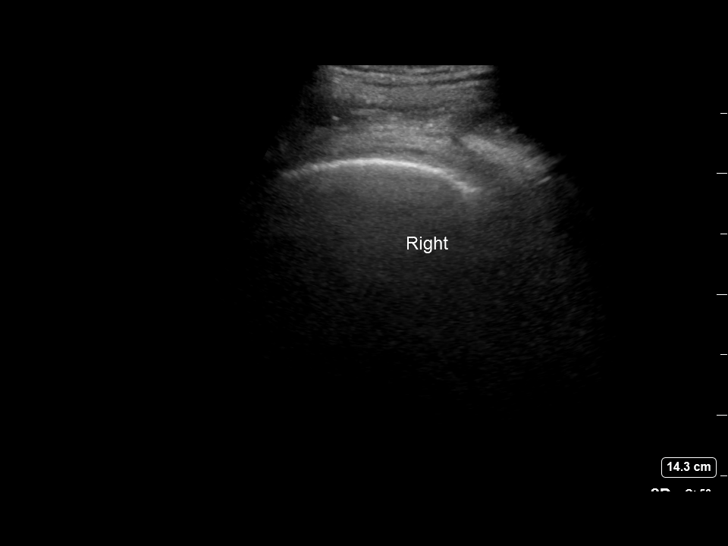
[im 16/18]
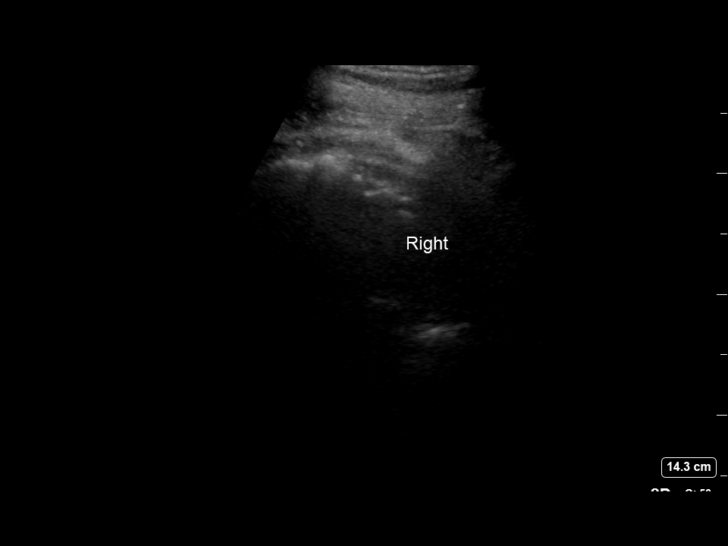
[im 18/18]
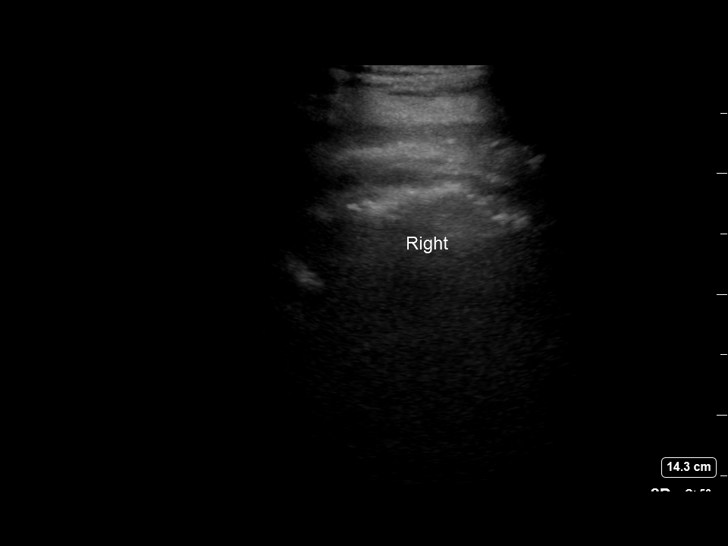

[14 of 16 positions shown; findings below may reference images not displayed]

FINDINGS: Ultrasound performed of the posterior chest bilaterally. Trace
pleural effusions bilaterally. There is not enough to warrant
thoracentesis. Procedure not performed.
IMPRESSION: Trace bilateral pleural effusions.  Thoracentesis not performed.

## 2020-09-16 IMAGING — CR CHEST - 2 VIEW
2 series · 2 of 2 positions shown · non-contrast
Comparison: Chest ultrasound dated April 12, 2019. Chest x-ray dated
April 09, 2019.

CLINICAL DATA: Hypoxia.

EXAM:
CHEST - 2 VIEW

[chest ap]
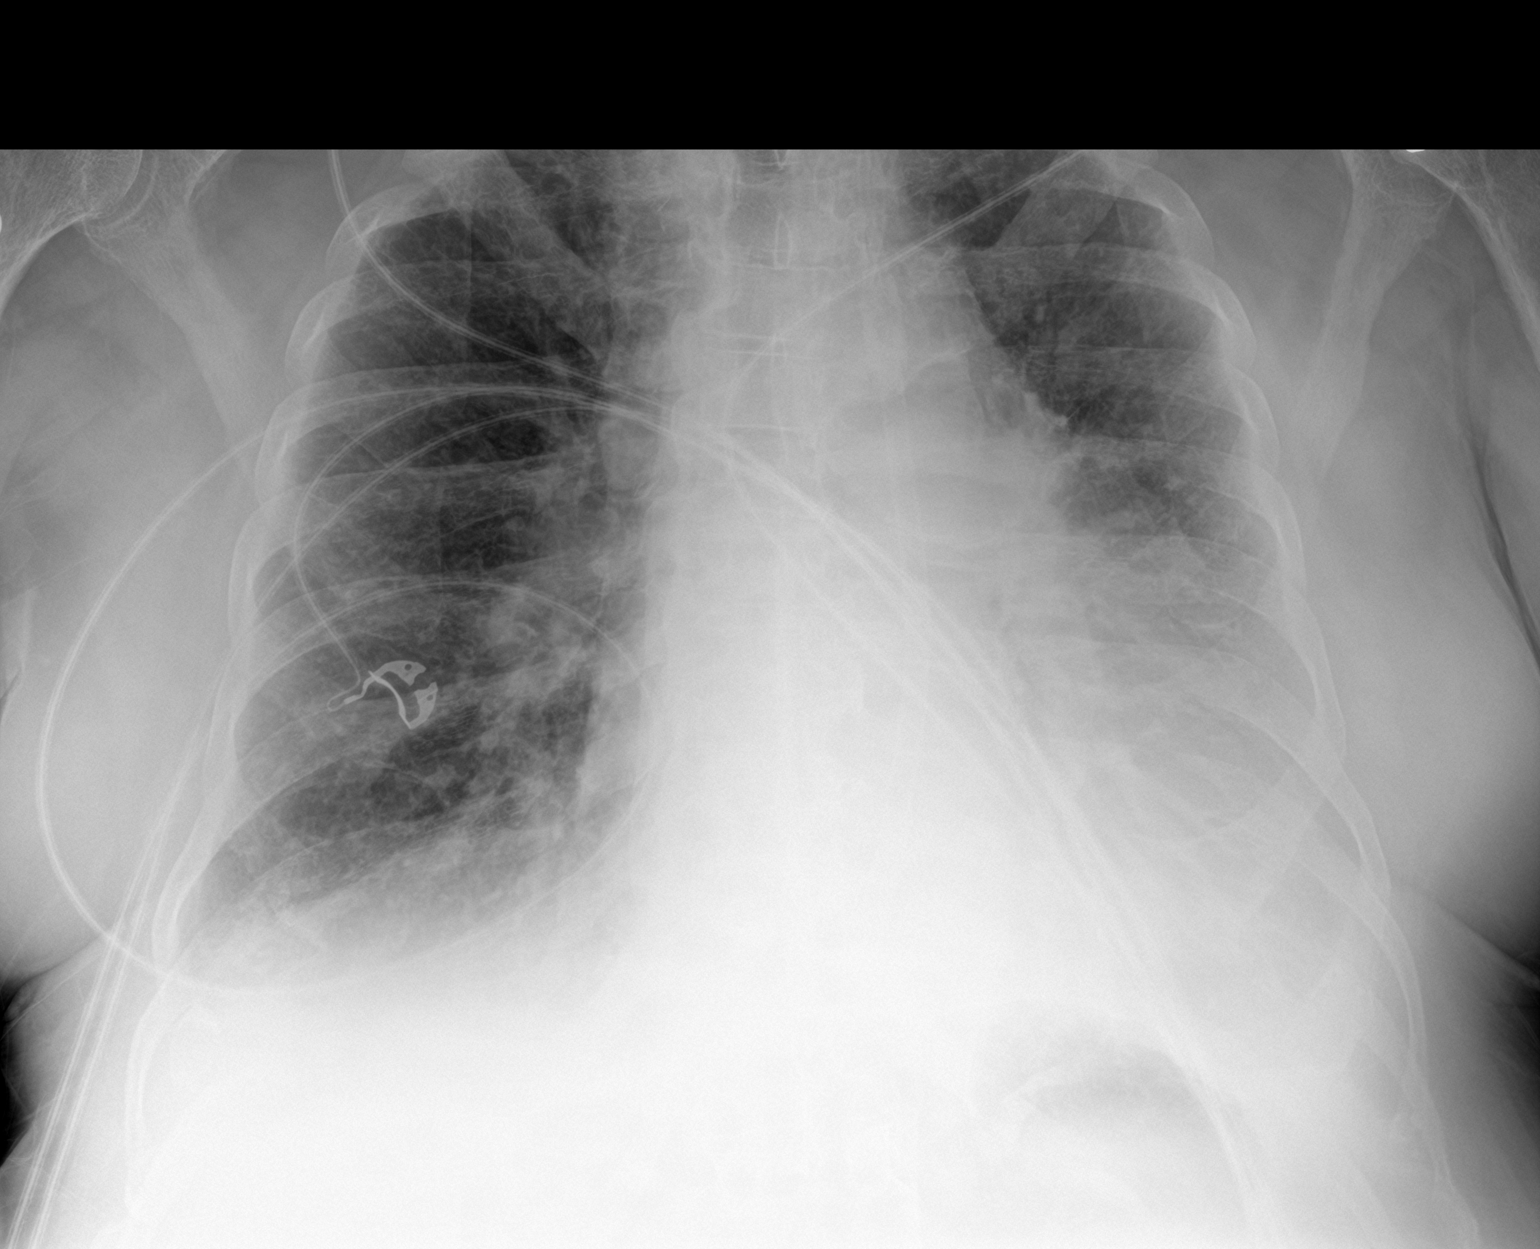

[chest lat]
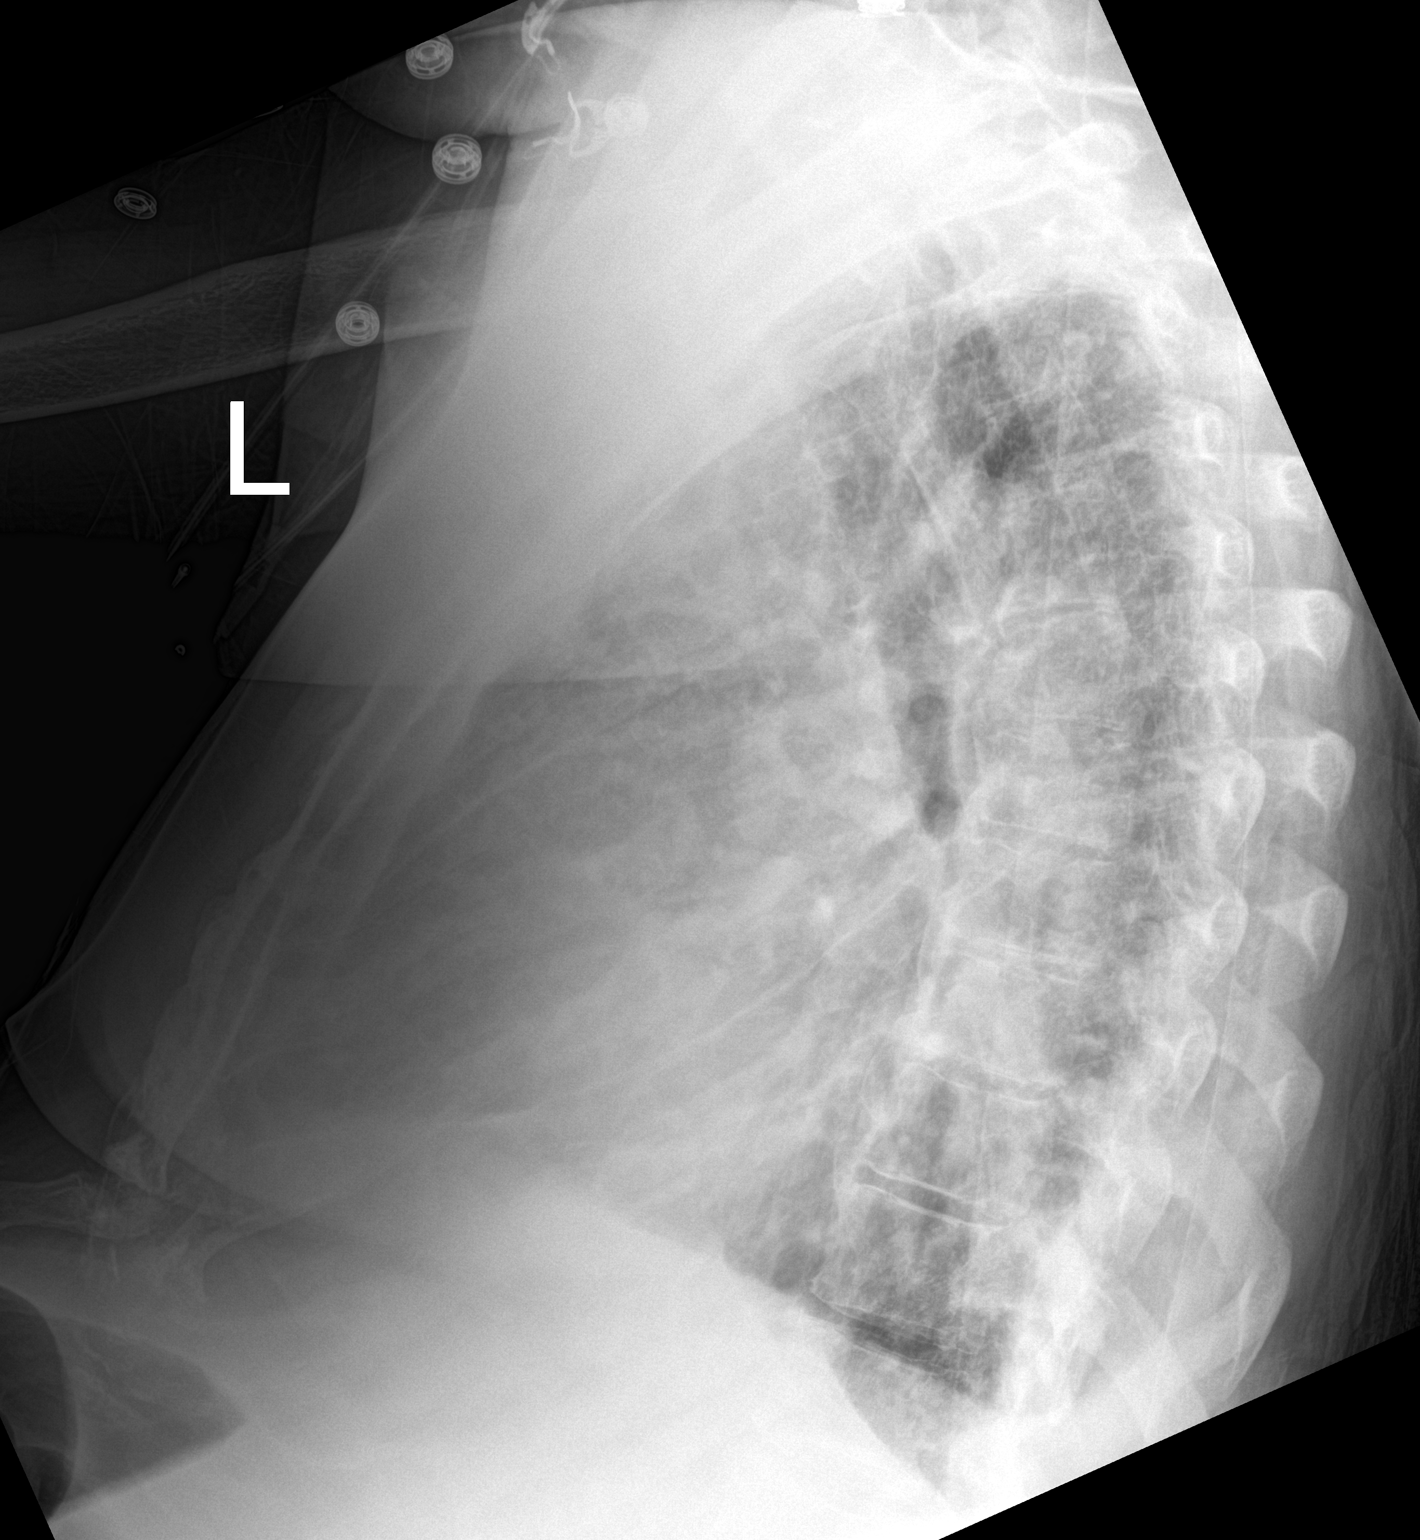

[2 of 2 positions shown; findings below may reference images not displayed]

FINDINGS: Stable cardiomegaly. Improved pulmonary edema with residual mild
interstitial edema. Improved aeration at the right lung base.
Unchanged left basilar atelectasis and/or consolidation. Trace
bilateral pleural effusions. No pneumothorax. No acute osseous
abnormality.
IMPRESSION: 1. Improving pulmonary edema. Unchanged left basilar atelectasis
and/or consolidation.
2. Unchanged trace bilateral pleural effusions.

## 2020-09-19 ENCOUNTER — Telehealth (HOSPITAL_COMMUNITY): Payer: Self-pay

## 2020-09-19 ENCOUNTER — Telehealth: Payer: Self-pay | Admitting: Internal Medicine

## 2020-09-19 MED ORDER — PREDNISONE 10 MG PO TABS: ORAL_TABLET | ORAL | 0 refills | Status: DC

## 2020-09-19 NOTE — Telephone Encounter (Signed)
Strongly prefer a televisit or actual ov with NP If declines then Prednisone 10 mg take  4 each am x 2 days,   2 each am x 2 days,  1 each am x 2 days and stop > if worse go to ER

## 2020-09-19 NOTE — Telephone Encounter (Signed)
Spoke with pt's wife. She is aware of MW's response. They declined an appointment. Rx has been sent in. Nothing further was needed.

## 2020-09-19 NOTE — Telephone Encounter (Signed)
Spoke with pt's wife, [name]. States that pt has been having increased DOE for the past 3 days. Denies chest tightness, wheezing, coughing or fever. Pt has been taking all of his pulmonary meds as prescribed. He would like to have a prednisone prescription sent in.  MW - please advise. Thanks.

## 2020-09-19 NOTE — Telephone Encounter (Signed)
Spoke with [facility]. I have given clarification on the pt's prednisone prescription. Nothing further was needed.

## 2020-09-19 NOTE — Telephone Encounter (Signed)
Patient advised and verbalized understanding 

## 2020-09-19 NOTE — Telephone Encounter (Signed)
Patient called requesting prednisone due to him feeling short of breath for the past 3 days. He also reports of having a 4lbs weight gain over the last 3 days as well as bilateral LE swelling. Please advise

## 2020-09-19 NOTE — Telephone Encounter (Signed)
Prednisone will need to come through either his pulmonologist or PCP.  Today is Wednesday so he should be taking a dose of metolazone today.  Would have him increase torsemide to 100 mg bid x 3 days then back to 80 mg bid.

## 2020-09-19 NOTE — Telephone Encounter (Signed)
Received call from patient's spouse, Patricia(DPR).  [name] was calling in reference to prednisone Rx. I have made [name] aware that Ria Comment has spoken with [facility] and clarified prednisone prescription.  Nothing further needed.

## 2020-09-21 ENCOUNTER — Ambulatory Visit: Payer: No Typology Code available for payment source | Admitting: Internal Medicine

## 2020-09-21 ENCOUNTER — Telehealth: Payer: Self-pay | Admitting: Internal Medicine

## 2020-09-21 NOTE — Telephone Encounter (Signed)
Patient's spouse, [name] [facility]) is aware of below recommendaitons.  Advised Nicholas Caldwell to bring all of patient meds to OV on 09/25/20. She voiced her understanding and had no further questions.  Nothing further needed.

## 2020-09-21 NOTE — Telephone Encounter (Signed)
Yes, can't do more s ov  - be sure to bring all meds to office and keep in mind if breztri is being used correctly it already contains prednisone

## 2020-09-21 NOTE — Telephone Encounter (Signed)
Spoke with patient's wife. She stated that the patient has started the prednisone taper that was called in on 09/19/20. It seems like it is working but the patient is wondering if he could have either a stronger dose of prednisone or something else to be called in with the prednisone. He is still having issues with DOE. She denied any new symptoms such as a cough, fever or body aches. He is still using his Breztri inhaler as prescribed. Patient has an appt on Tuesday with Dr. Melvyn Novas. I advised her to make sure that he keeps this appointment.   I did advise her that MW may not recommend anything in addition to the prednisone since he was not having new symptoms and was starting to feel better. She verbalized understanding but still wanted the message to be sent to [facility].   MW, please advise. Thanks!

## 2020-09-25 ENCOUNTER — Telehealth: Payer: Self-pay | Admitting: Internal Medicine

## 2020-09-25 ENCOUNTER — Other Ambulatory Visit: Payer: Self-pay

## 2020-09-25 ENCOUNTER — Emergency Department (HOSPITAL_COMMUNITY): Payer: No Typology Code available for payment source

## 2020-09-25 ENCOUNTER — Inpatient Hospital Stay (HOSPITAL_COMMUNITY)
Admission: EM | Admit: 2020-09-25 | Discharge: 2020-10-09 | DRG: 291 | Disposition: A | Discharge status: Skilled Nursing Facility | Payer: No Typology Code available for payment source | Attending: Internal Medicine | Admitting: Internal Medicine

## 2020-09-25 ENCOUNTER — Ambulatory Visit: Payer: No Typology Code available for payment source | Admitting: Internal Medicine

## 2020-09-25 DIAGNOSIS — M2550 Pain in unspecified joint: Secondary | ICD-10-CM | POA: Diagnosis not present

## 2020-09-25 DIAGNOSIS — E872 Acidosis: Secondary | ICD-10-CM | POA: Diagnosis present

## 2020-09-25 DIAGNOSIS — E1122 Type 2 diabetes mellitus with diabetic chronic kidney disease: Secondary | ICD-10-CM | POA: Diagnosis present

## 2020-09-25 DIAGNOSIS — J431 Panlobular emphysema: Secondary | ICD-10-CM | POA: Diagnosis present

## 2020-09-25 DIAGNOSIS — Y929 Unspecified place or not applicable: Secondary | ICD-10-CM

## 2020-09-25 DIAGNOSIS — Z8249 Family history of ischemic heart disease and other diseases of the circulatory system: Secondary | ICD-10-CM

## 2020-09-25 DIAGNOSIS — I50810 Right heart failure, unspecified: Secondary | ICD-10-CM | POA: Diagnosis not present

## 2020-09-25 DIAGNOSIS — Z87891 Personal history of nicotine dependence: Secondary | ICD-10-CM | POA: Diagnosis not present

## 2020-09-25 DIAGNOSIS — J9690 Respiratory failure, unspecified, unspecified whether with hypoxia or hypercapnia: Secondary | ICD-10-CM | POA: Diagnosis not present

## 2020-09-25 DIAGNOSIS — Z888 Allergy status to other drugs, medicaments and biological substances status: Secondary | ICD-10-CM

## 2020-09-25 DIAGNOSIS — E785 Hyperlipidemia, unspecified: Secondary | ICD-10-CM | POA: Diagnosis present

## 2020-09-25 DIAGNOSIS — J690 Pneumonitis due to inhalation of food and vomit: Secondary | ICD-10-CM | POA: Diagnosis not present

## 2020-09-25 DIAGNOSIS — J441 Chronic obstructive pulmonary disease with (acute) exacerbation: Secondary | ICD-10-CM | POA: Diagnosis not present

## 2020-09-25 DIAGNOSIS — R509 Fever, unspecified: Secondary | ICD-10-CM

## 2020-09-25 DIAGNOSIS — R0602 Shortness of breath: Secondary | ICD-10-CM | POA: Diagnosis not present

## 2020-09-25 DIAGNOSIS — Z794 Long term (current) use of insulin: Secondary | ICD-10-CM | POA: Diagnosis not present

## 2020-09-25 DIAGNOSIS — I480 Paroxysmal atrial fibrillation: Secondary | ICD-10-CM | POA: Diagnosis present

## 2020-09-25 DIAGNOSIS — E1140 Type 2 diabetes mellitus with diabetic neuropathy, unspecified: Secondary | ICD-10-CM | POA: Diagnosis present

## 2020-09-25 DIAGNOSIS — Z9119 Patient's noncompliance with other medical treatment and regimen: Secondary | ICD-10-CM

## 2020-09-25 DIAGNOSIS — G4733 Obstructive sleep apnea (adult) (pediatric): Secondary | ICD-10-CM | POA: Diagnosis not present

## 2020-09-25 DIAGNOSIS — I2729 Other secondary pulmonary hypertension: Secondary | ICD-10-CM | POA: Diagnosis present

## 2020-09-25 DIAGNOSIS — I5082 Biventricular heart failure: Secondary | ICD-10-CM | POA: Diagnosis present

## 2020-09-25 DIAGNOSIS — R1900 Intra-abdominal and pelvic swelling, mass and lump, unspecified site: Secondary | ICD-10-CM | POA: Diagnosis not present

## 2020-09-25 DIAGNOSIS — L89153 Pressure ulcer of sacral region, stage 3: Secondary | ICD-10-CM | POA: Diagnosis present

## 2020-09-25 DIAGNOSIS — J449 Chronic obstructive pulmonary disease, unspecified: Secondary | ICD-10-CM | POA: Diagnosis present

## 2020-09-25 DIAGNOSIS — I5043 Acute on chronic combined systolic (congestive) and diastolic (congestive) heart failure: Secondary | ICD-10-CM | POA: Diagnosis present

## 2020-09-25 DIAGNOSIS — G9340 Encephalopathy, unspecified: Secondary | ICD-10-CM | POA: Diagnosis not present

## 2020-09-25 DIAGNOSIS — I5021 Acute systolic (congestive) heart failure: Secondary | ICD-10-CM | POA: Diagnosis not present

## 2020-09-25 DIAGNOSIS — I5023 Acute on chronic systolic (congestive) heart failure: Secondary | ICD-10-CM | POA: Diagnosis not present

## 2020-09-25 DIAGNOSIS — Z7401 Bed confinement status: Secondary | ICD-10-CM | POA: Diagnosis not present

## 2020-09-25 DIAGNOSIS — Z5329 Procedure and treatment not carried out because of patient's decision for other reasons: Secondary | ICD-10-CM | POA: Diagnosis not present

## 2020-09-25 DIAGNOSIS — D3502 Benign neoplasm of left adrenal gland: Secondary | ICD-10-CM | POA: Diagnosis not present

## 2020-09-25 DIAGNOSIS — J9620 Acute and chronic respiratory failure, unspecified whether with hypoxia or hypercapnia: Secondary | ICD-10-CM | POA: Diagnosis not present

## 2020-09-25 DIAGNOSIS — J811 Chronic pulmonary edema: Secondary | ICD-10-CM | POA: Diagnosis not present

## 2020-09-25 DIAGNOSIS — Z8674 Personal history of sudden cardiac arrest: Secondary | ICD-10-CM

## 2020-09-25 DIAGNOSIS — I2609 Other pulmonary embolism with acute cor pulmonale: Secondary | ICD-10-CM | POA: Diagnosis not present

## 2020-09-25 DIAGNOSIS — Z20822 Contact with and (suspected) exposure to covid-19: Secondary | ICD-10-CM | POA: Diagnosis present

## 2020-09-25 DIAGNOSIS — N184 Chronic kidney disease, stage 4 (severe): Secondary | ICD-10-CM | POA: Diagnosis present

## 2020-09-25 DIAGNOSIS — J9622 Acute and chronic respiratory failure with hypercapnia: Secondary | ICD-10-CM | POA: Diagnosis present

## 2020-09-25 DIAGNOSIS — R531 Weakness: Secondary | ICD-10-CM | POA: Diagnosis not present

## 2020-09-25 DIAGNOSIS — J90 Pleural effusion, not elsewhere classified: Secondary | ICD-10-CM | POA: Diagnosis not present

## 2020-09-25 DIAGNOSIS — E119 Type 2 diabetes mellitus without complications: Secondary | ICD-10-CM | POA: Diagnosis not present

## 2020-09-25 DIAGNOSIS — J42 Unspecified chronic bronchitis: Secondary | ICD-10-CM | POA: Diagnosis not present

## 2020-09-25 DIAGNOSIS — I130 Hypertensive heart and chronic kidney disease with heart failure and stage 1 through stage 4 chronic kidney disease, or unspecified chronic kidney disease: Principal | ICD-10-CM | POA: Diagnosis present

## 2020-09-25 DIAGNOSIS — G928 Other toxic encephalopathy: Secondary | ICD-10-CM | POA: Diagnosis present

## 2020-09-25 DIAGNOSIS — E7800 Pure hypercholesterolemia, unspecified: Secondary | ICD-10-CM | POA: Diagnosis present

## 2020-09-25 DIAGNOSIS — I2510 Atherosclerotic heart disease of native coronary artery without angina pectoris: Secondary | ICD-10-CM | POA: Diagnosis present

## 2020-09-25 DIAGNOSIS — I10 Essential (primary) hypertension: Secondary | ICD-10-CM | POA: Diagnosis not present

## 2020-09-25 DIAGNOSIS — I700 Atherosclerosis of aorta: Secondary | ICD-10-CM | POA: Diagnosis not present

## 2020-09-25 DIAGNOSIS — E662 Morbid (severe) obesity with alveolar hypoventilation: Secondary | ICD-10-CM | POA: Diagnosis present

## 2020-09-25 DIAGNOSIS — I517 Cardiomegaly: Secondary | ICD-10-CM | POA: Diagnosis not present

## 2020-09-25 DIAGNOSIS — R109 Unspecified abdominal pain: Secondary | ICD-10-CM | POA: Diagnosis not present

## 2020-09-25 DIAGNOSIS — Z79899 Other long term (current) drug therapy: Secondary | ICD-10-CM

## 2020-09-25 DIAGNOSIS — Z515 Encounter for palliative care: Secondary | ICD-10-CM | POA: Diagnosis not present

## 2020-09-25 DIAGNOSIS — I959 Hypotension, unspecified: Secondary | ICD-10-CM | POA: Diagnosis not present

## 2020-09-25 DIAGNOSIS — R269 Unspecified abnormalities of gait and mobility: Secondary | ICD-10-CM | POA: Diagnosis not present

## 2020-09-25 DIAGNOSIS — I071 Rheumatic tricuspid insufficiency: Secondary | ICD-10-CM | POA: Diagnosis present

## 2020-09-25 DIAGNOSIS — E1142 Type 2 diabetes mellitus with diabetic polyneuropathy: Secondary | ICD-10-CM | POA: Diagnosis present

## 2020-09-25 DIAGNOSIS — I509 Heart failure, unspecified: Secondary | ICD-10-CM

## 2020-09-25 DIAGNOSIS — J439 Emphysema, unspecified: Secondary | ICD-10-CM | POA: Diagnosis present

## 2020-09-25 DIAGNOSIS — Z79891 Long term (current) use of opiate analgesic: Secondary | ICD-10-CM

## 2020-09-25 DIAGNOSIS — R1084 Generalized abdominal pain: Secondary | ICD-10-CM | POA: Diagnosis not present

## 2020-09-25 DIAGNOSIS — E876 Hypokalemia: Secondary | ICD-10-CM | POA: Diagnosis present

## 2020-09-25 DIAGNOSIS — Z8673 Personal history of transient ischemic attack (TIA), and cerebral infarction without residual deficits: Secondary | ICD-10-CM

## 2020-09-25 DIAGNOSIS — Z7189 Other specified counseling: Secondary | ICD-10-CM | POA: Diagnosis not present

## 2020-09-25 DIAGNOSIS — I429 Cardiomyopathy, unspecified: Secondary | ICD-10-CM | POA: Diagnosis not present

## 2020-09-25 DIAGNOSIS — G8929 Other chronic pain: Secondary | ICD-10-CM | POA: Diagnosis present

## 2020-09-25 DIAGNOSIS — G9341 Metabolic encephalopathy: Secondary | ICD-10-CM | POA: Diagnosis present

## 2020-09-25 DIAGNOSIS — J9600 Acute respiratory failure, unspecified whether with hypoxia or hypercapnia: Secondary | ICD-10-CM | POA: Diagnosis not present

## 2020-09-25 DIAGNOSIS — J9621 Acute and chronic respiratory failure with hypoxia: Secondary | ICD-10-CM | POA: Diagnosis present

## 2020-09-25 DIAGNOSIS — I428 Other cardiomyopathies: Secondary | ICD-10-CM | POA: Diagnosis present

## 2020-09-25 DIAGNOSIS — J9602 Acute respiratory failure with hypercapnia: Secondary | ICD-10-CM | POA: Diagnosis not present

## 2020-09-25 DIAGNOSIS — N179 Acute kidney failure, unspecified: Secondary | ICD-10-CM | POA: Diagnosis not present

## 2020-09-25 DIAGNOSIS — R5381 Other malaise: Secondary | ICD-10-CM | POA: Diagnosis not present

## 2020-09-25 DIAGNOSIS — T426X5A Adverse effect of other antiepileptic and sedative-hypnotic drugs, initial encounter: Secondary | ICD-10-CM | POA: Diagnosis present

## 2020-09-25 DIAGNOSIS — Z9081 Acquired absence of spleen: Secondary | ICD-10-CM

## 2020-09-25 DIAGNOSIS — I4892 Unspecified atrial flutter: Secondary | ICD-10-CM | POA: Diagnosis not present

## 2020-09-25 DIAGNOSIS — J9601 Acute respiratory failure with hypoxia: Secondary | ICD-10-CM | POA: Diagnosis not present

## 2020-09-25 DIAGNOSIS — J9811 Atelectasis: Secondary | ICD-10-CM | POA: Diagnosis not present

## 2020-09-25 DIAGNOSIS — M25551 Pain in right hip: Secondary | ICD-10-CM | POA: Diagnosis not present

## 2020-09-25 DIAGNOSIS — R197 Diarrhea, unspecified: Secondary | ICD-10-CM | POA: Diagnosis not present

## 2020-09-25 DIAGNOSIS — Z7901 Long term (current) use of anticoagulants: Secondary | ICD-10-CM

## 2020-09-25 DIAGNOSIS — Z9981 Dependence on supplemental oxygen: Secondary | ICD-10-CM | POA: Diagnosis not present

## 2020-09-25 DIAGNOSIS — D631 Anemia in chronic kidney disease: Secondary | ICD-10-CM | POA: Diagnosis present

## 2020-09-25 DIAGNOSIS — Z6835 Body mass index (BMI) 35.0-35.9, adult: Secondary | ICD-10-CM

## 2020-09-25 DIAGNOSIS — R627 Adult failure to thrive: Secondary | ICD-10-CM | POA: Diagnosis present

## 2020-09-25 HISTORY — DX: Acute systolic (congestive) heart failure: I50.21

## 2020-09-25 LAB — I-STAT ARTERIAL BLOOD GAS, ED
Acid-base deficit: 4 mmol/L — ABNORMAL HIGH (ref 0.0–2.0)
Bicarbonate: 26.7 mmol/L (ref 20.0–28.0)
Calcium, Ion: 1.22 mmol/L (ref 1.15–1.40)
HCT: 36 % — ABNORMAL LOW (ref 39.0–52.0)
Hemoglobin: 12.2 g/dL — ABNORMAL LOW (ref 13.0–17.0)
O2 Saturation: 78 %
Patient temperature: 98.6
Potassium: 4.6 mmol/L (ref 3.5–5.1)
Sodium: 145 mmol/L (ref 135–145)
TCO2: 29 mmol/L (ref 22–32)
pCO2 arterial: 77.4 mmHg (ref 32.0–48.0)
pH, Arterial: 7.146 — CL (ref 7.350–7.450)
pO2, Arterial: 56 mmHg — ABNORMAL LOW (ref 83.0–108.0)

## 2020-09-25 LAB — CBC WITH DIFFERENTIAL/PLATELET
Abs Immature Granulocytes: 0.02 10*3/uL (ref 0.00–0.07)
Basophils Absolute: 0 10*3/uL (ref 0.0–0.1)
Basophils Relative: 0 %
Eosinophils Absolute: 0.1 10*3/uL (ref 0.0–0.5)
Eosinophils Relative: 1 %
HCT: 36.1 % — ABNORMAL LOW (ref 39.0–52.0)
Hemoglobin: 10.4 g/dL — ABNORMAL LOW (ref 13.0–17.0)
Immature Granulocytes: 0 %
Lymphocytes Relative: 8 %
Lymphs Abs: 0.4 10*3/uL — ABNORMAL LOW (ref 0.7–4.0)
MCH: 25.9 pg — ABNORMAL LOW (ref 26.0–34.0)
MCHC: 28.8 g/dL — ABNORMAL LOW (ref 30.0–36.0)
MCV: 89.8 fL (ref 80.0–100.0)
Monocytes Absolute: 1.2 10*3/uL — ABNORMAL HIGH (ref 0.1–1.0)
Monocytes Relative: 21 %
Neutro Abs: 3.9 10*3/uL (ref 1.7–7.7)
Neutrophils Relative %: 70 %
Platelets: 328 10*3/uL (ref 150–400)
RBC: 4.02 MIL/uL — ABNORMAL LOW (ref 4.22–5.81)
RDW: 20.1 % — ABNORMAL HIGH (ref 11.5–15.5)
WBC: 5.6 10*3/uL (ref 4.0–10.5)
nRBC: 0.4 % — ABNORMAL HIGH (ref 0.0–0.2)

## 2020-09-25 LAB — BRAIN NATRIURETIC PEPTIDE: B Natriuretic Peptide: 3275.6 pg/mL — ABNORMAL HIGH (ref 0.0–100.0)

## 2020-09-25 LAB — CBG MONITORING, ED
Glucose-Capillary: 134 mg/dL — ABNORMAL HIGH (ref 70–99)
Glucose-Capillary: 117 mg/dL — ABNORMAL HIGH (ref 70–99)

## 2020-09-25 LAB — BASIC METABOLIC PANEL
Anion gap: 12 (ref 5–15)
BUN: 53 mg/dL — ABNORMAL HIGH (ref 8–23)
CO2: 25 mmol/L (ref 22–32)
Calcium: 8.4 mg/dL — ABNORMAL LOW (ref 8.9–10.3)
Chloride: 107 mmol/L (ref 98–111)
Creatinine, Ser: 3.54 mg/dL — ABNORMAL HIGH (ref 0.61–1.24)
GFR, Estimated: 18 mL/min — ABNORMAL LOW (ref >60)
Glucose, Bld: 141 mg/dL — ABNORMAL HIGH (ref 70–99)
Potassium: 4.4 mmol/L (ref 3.5–5.1)
Sodium: 144 mmol/L (ref 135–145)

## 2020-09-25 LAB — HEMOGLOBIN A1C
Hgb A1c MFr Bld: 6.7 % — ABNORMAL HIGH (ref 4.8–5.6)
Mean Plasma Glucose: 145.59 mg/dL

## 2020-09-25 LAB — HEPATIC FUNCTION PANEL
ALT: 13 U/L (ref 0–44)
AST: 20 U/L (ref 15–41)
Albumin: 3.1 g/dL — ABNORMAL LOW (ref 3.5–5.0)
Alkaline Phosphatase: 80 U/L (ref 38–126)
Bilirubin, Direct: 0.1 mg/dL (ref 0.0–0.2)
Indirect Bilirubin: 0.7 mg/dL (ref 0.3–0.9)
Total Bilirubin: 0.8 mg/dL (ref 0.3–1.2)
Total Protein: 7.2 g/dL (ref 6.5–8.1)

## 2020-09-25 LAB — RESPIRATORY PANEL BY RT PCR (FLU A&B, COVID)
Influenza A by PCR: NEGATIVE
Influenza B by PCR: NEGATIVE
SARS Coronavirus 2 by RT PCR: NEGATIVE

## 2020-09-25 LAB — GLUCOSE, CAPILLARY: Glucose-Capillary: 106 mg/dL — ABNORMAL HIGH (ref 70–99)

## 2020-09-25 LAB — TROPONIN I (HIGH SENSITIVITY)
Troponin I (High Sensitivity): 78 ng/L — ABNORMAL HIGH (ref <18)
Troponin I (High Sensitivity): 83 ng/L — ABNORMAL HIGH (ref <18)

## 2020-09-25 LAB — MRSA PCR SCREENING: MRSA by PCR: NEGATIVE

## 2020-09-25 MED ORDER — SODIUM CHLORIDE 0.9% FLUSH
3.0000 mL | Freq: Two times a day (BID) | INTRAVENOUS | Status: DC
  Administered 2020-09-25 – 2020-10-09 (×21): 3 mL via INTRAVENOUS

## 2020-09-25 MED ORDER — FUROSEMIDE 10 MG/ML IJ SOLN: 120.0000 mg | Freq: Three times a day (TID) | INTRAMUSCULAR | Status: DC

## 2020-09-25 MED ORDER — FUROSEMIDE 10 MG/ML IJ SOLN
40.0000 mg | Freq: Once | INTRAMUSCULAR | Status: AC
  Administered 2020-09-25: 40 mg via INTRAVENOUS
  Filled 2020-09-25: qty 4

## 2020-09-25 MED ORDER — APIXABAN 5 MG PO TABS
5.0000 mg | ORAL_TABLET | Freq: Two times a day (BID) | ORAL | Status: DC
  Administered 2020-09-26 – 2020-10-09 (×25): 5 mg via ORAL
  Filled 2020-09-25 (×26): qty 1

## 2020-09-25 MED ORDER — ACETAMINOPHEN 325 MG PO TABS
650.0000 mg | ORAL_TABLET | ORAL | Status: DC | PRN
  Administered 2020-09-27 – 2020-10-09 (×8): 650 mg via ORAL
  Filled 2020-09-25 (×9): qty 2

## 2020-09-25 MED ORDER — PREDNISONE 10 MG PO TABS: ORAL_TABLET | ORAL | 0 refills | Status: DC

## 2020-09-25 MED ORDER — HEPARIN SODIUM (PORCINE) 5000 UNIT/ML IJ SOLN: 5000.0000 [IU] | Freq: Three times a day (TID) | INTRAMUSCULAR | Status: DC

## 2020-09-25 MED ORDER — ALBUTEROL SULFATE (2.5 MG/3ML) 0.083% IN NEBU: 2.5000 mg | INHALATION_SOLUTION | RESPIRATORY_TRACT | Status: DC | PRN

## 2020-09-25 MED ORDER — OXYCODONE-ACETAMINOPHEN 5-325 MG PO TABS
2.0000 | ORAL_TABLET | Freq: Once | ORAL | Status: AC
  Administered 2020-09-25: 2 via ORAL
  Filled 2020-09-25: qty 2

## 2020-09-25 MED ORDER — AMIODARONE HCL 100 MG PO TABS
100.0000 mg | ORAL_TABLET | Freq: Every day | ORAL | Status: DC
  Administered 2020-09-27 – 2020-10-09 (×13): 100 mg via ORAL
  Filled 2020-09-25 (×13): qty 1

## 2020-09-25 MED ORDER — SODIUM CHLORIDE 0.9% FLUSH: 3.0000 mL | INTRAVENOUS | Status: DC | PRN

## 2020-09-25 MED ORDER — INSULIN ASPART 100 UNIT/ML ~~LOC~~ SOLN
0.0000 [IU] | SUBCUTANEOUS | Status: DC
  Administered 2020-09-25 – 2020-09-27 (×5): 1 [IU] via SUBCUTANEOUS
  Administered 2020-09-27: 2 [IU] via SUBCUTANEOUS
  Administered 2020-09-28: 3 [IU] via SUBCUTANEOUS
  Administered 2020-09-29 (×3): 1 [IU] via SUBCUTANEOUS
  Administered 2020-09-29: 2 [IU] via SUBCUTANEOUS
  Administered 2020-09-29 – 2020-09-30 (×2): 1 [IU] via SUBCUTANEOUS
  Administered 2020-09-30: 2 [IU] via SUBCUTANEOUS
  Administered 2020-09-30: 1 [IU] via SUBCUTANEOUS
  Administered 2020-09-30: 2 [IU] via SUBCUTANEOUS
  Administered 2020-10-01 (×3): 1 [IU] via SUBCUTANEOUS
  Administered 2020-10-01: 2 [IU] via SUBCUTANEOUS
  Administered 2020-10-01 – 2020-10-02 (×2): 1 [IU] via SUBCUTANEOUS
  Administered 2020-10-02 (×2): 2 [IU] via SUBCUTANEOUS
  Administered 2020-10-02 – 2020-10-05 (×9): 1 [IU] via SUBCUTANEOUS
  Administered 2020-10-05 – 2020-10-06 (×4): 2 [IU] via SUBCUTANEOUS
  Administered 2020-10-07: 1 [IU] via SUBCUTANEOUS

## 2020-09-25 MED ORDER — SODIUM CHLORIDE 0.9 % IV SOLN
250.0000 mL | INTRAVENOUS | Status: DC | PRN
  Administered 2020-10-02: 250 mL via INTRAVENOUS

## 2020-09-25 MED ORDER — FUROSEMIDE 10 MG/ML IJ SOLN
120.0000 mg | Freq: Three times a day (TID) | INTRAVENOUS | Status: DC
  Administered 2020-09-25 – 2020-10-03 (×23): 120 mg via INTRAVENOUS
  Filled 2020-09-25 (×2): qty 10
  Filled 2020-09-25: qty 2
  Filled 2020-09-25: qty 10
  Filled 2020-09-25: qty 12
  Filled 2020-09-25 (×3): qty 10
  Filled 2020-09-25: qty 2
  Filled 2020-09-25: qty 12
  Filled 2020-09-25: qty 10
  Filled 2020-09-25: qty 2
  Filled 2020-09-25 (×3): qty 10
  Filled 2020-09-25: qty 4
  Filled 2020-09-25: qty 2
  Filled 2020-09-25: qty 10
  Filled 2020-09-25 (×4): qty 2
  Filled 2020-09-25: qty 10
  Filled 2020-09-25 (×2): qty 12
  Filled 2020-09-25: qty 10

## 2020-09-25 NOTE — Telephone Encounter (Signed)
Spoke with pt's wife, [name]. States that the pt was having a hard time catching his breath. She called 911 to have him transported to the ER. He is currently at [facility] ER. She wanted MW to be aware.

## 2020-09-25 NOTE — Telephone Encounter (Signed)
Called spoke with patient and wife.  Patient was given 75m tablets of prednisone last week Wednesday . Patient still having congestion, shortness of breath, runny nose. Patient taking nebulizer treatment daily, can't inhaler so doesn't feel like he is getting the medication since he can't inhaler. Patient not taking any over the counter medications.    Dr. WMelvyn Novasplease advise.

## 2020-09-25 NOTE — ED Provider Notes (Signed)
Care assumed from Dr. Alvino Chapel at shift change.  Patient awaiting results of laboratory studies.  In short he has extensive past medical history including congestive heart failure and presents today with shortness of breath and increased oxygen requirement.  He is requiring nonrebreather to maintain saturations in the 90s.  Patient given IV Lasix, but will require admission to the hospitalist service.  I spoke with Dr. Wynelle Cleveland who agrees to admit.   Veryl Speak, MD 09/25/20 [phone]

## 2020-09-25 NOTE — ED Notes (Signed)
Pt resting in stretcher with bipap machine in place, breathing nonlabored, pt disoriented but alert. VSS on ccm, wife at bedside. bipap settings: 14/6, 50% O2

## 2020-09-25 NOTE — Progress Notes (Signed)
RT note. Pt. Transported ER to [facility] without any complications.

## 2020-09-25 NOTE — Telephone Encounter (Signed)
Prednisone 10 mg take  4 each am x 2 days,   2 each am x 2 days,  1 each am x 2 days and stop   Ov in one week with me or NP and all meds in hand including neb solutions

## 2020-09-25 NOTE — Telephone Encounter (Signed)
Called spoke with patient's wife, let her know Dr. Gustavus Bryant recommendations.  Follow up made for 12.2.21 with Wyn Quaker NP  Explained to bring all medications including the nebulizer medications.  She voiced understanding Nothing further needed at this time.

## 2020-09-25 NOTE — ED Triage Notes (Signed)
Pt BIB [facility] from home. Complaint of increased shortness of breath and ascites x1 week. Pt SpO2 86% 4L [place] upon EMS arrival. SpO2 98-99% 10L [facility]. Increased abdominal distension. VSS. NAD.

## 2020-09-25 NOTE — H&P (Signed)
History and Physical    Nicholas Caldwell  IRW:[id number]  DOB: [birth year]  DOA: 09/25/2020 PCP: [name], MD   Patient coming from: home  Chief Complaint: brought in for shortness of breath  HPI: Nicholas Caldwell is a 74 y.o. male with medical history of NISCM, systolic CHF and mod RV failure, DM2, CKD 4, PAF s/p DCCV, COPD on home O2, OHS/ OSA on 3 L O2 and CPAP & CAD.  The patient is currently sleepy and unable to give me a detailed history. He knows he is at [facility] and know his age but will not tell me why he is in the hospital and falls back to sleep. Per ED notes, he has been more short of breath for about 1 wk and has more fluid on his legs.   ED Course: Pulse ox was 86% on 4 L. Stayed in 80s on 6 L. Subsequently placed on a non re-breather- pulse ox has been 100%. Given 40 mg IV Lasix. CXR reveals pulmonary edema.   Review of Systems:  Unable to obtain.  All other systems reviewed and apart from HPI, are negative.  Past Medical History:  Diagnosis Date  . Atrial flutter ([place])    a. recurrent AFlutter with RVR;  b. Amiodarone Rx started 4/16  . CAD (coronary artery disease)    a. LHC 1/16:  mLAD diffuse disease, pLCx mild disease, dLCx with disease but too small for PCI, RCA ok, EF 25-30%  . Chronic pain   . Chronic systolic CHF (congestive heart failure) ([place])   . COPD (chronic obstructive pulmonary disease) ([place])   . Diabetes mellitus without complication ([place])   . Ex-smoker 11/2017  . Hypercholesteremia   . Hypertension   . NICM (nonischemic cardiomyopathy) ([place])    a.dx 2016. b. 2D echo 06/2016 - Last echo 07/01/16: mod dilated LV, mod LVH, EF 25-30%, mild-mod MR, sev LAE, mild-mod reduced RV systolic function, mild-mod TR, PASP 15mHG.  .Marland KitchenPAF (paroxysmal atrial fibrillation) ([facility])    On amio - ot a candidate for flecainide due to cardiomyopathy, not a candidate for Tikosyn due to prolonged QT, and felt to be a poor candidate for ablation given left atrial size.  .  Pulmonary hypertension ([place]   . Tobacco abuse     Past Surgical History:  Procedure Laterality Date  . BIOPSY  03/11/2019   Procedure: BIOPSY;  Surgeon: CLavena Bullion DO;  [place]  Service: Gastroenterology;;  . BIOPSY  03/15/2019   Procedure: BIOPSY;  Surgeon: MIrving Copas, MD;  [place]  Service: Gastroenterology;;  . CARDIOVERSION N/A 07/30/2018   Procedure: CARDIOVERSION;  Surgeon: MLarey Dresser MD;  [facility];  Service: Cardiovascular;  Laterality: N/A;  . COLONOSCOPY N/A 03/11/2019   Procedure: COLONOSCOPY;  Surgeon: CLavena Bullion DO;  [place]  Service: Gastroenterology;  Laterality: N/A;  . ENTEROSCOPY N/A 03/15/2019   Procedure: ENTEROSCOPY;  Surgeon: MRush LandmarkGTelford Nab, MD;  [place]  Service: Gastroenterology;  Laterality: N/A;  . ESOPHAGOGASTRODUODENOSCOPY Left 03/11/2019   Procedure: ESOPHAGOGASTRODUODENOSCOPY (EGD);  Surgeon: CLavena Bullion DO;  [facility];  Service: Gastroenterology;  Laterality: Left;  . LEFT AND RIGHT HEART CATHETERIZATION WITH CORONARY ANGIOGRAM N/A 11/06/2014   Procedure: LEFT AND RIGHT HEART CATHETERIZATION WITH CORONARY ANGIOGRAM;  Surgeon: JJettie Booze MD;  [facility];  Service: Cardiovascular;  Laterality: N/A;  . RIGHT/LEFT HEART CATH AND CORONARY ANGIOGRAPHY N/A 12/06/2018   Procedure: RIGHT/LEFT HEART CATH AND  CORONARY ANGIOGRAPHY;  Surgeon: Larey Dresser, MD;  Location: [place] CV LAB;  Service: Cardiovascular;  Laterality: N/A;  . SPLENECTOMY    . SUBMUCOSAL TATTOO INJECTION  03/15/2019   Procedure: SUBMUCOSAL TATTOO INJECTION;  Surgeon: Rush Landmark Telford Nab., MD;  Location: [place];  Service: Gastroenterology;;    Social History:   reports that he quit smoking about 21 months ago. His smoking use included cigarettes. He has a 34.00 pack-year smoking history. He has never used smokeless tobacco. He  reports that he does not drink alcohol and does not use drugs.  Allergies  Allergen Reactions  . Bidil [Isosorb Dinitrate-Hydralazine] Shortness Of Breath    Family History  Problem Relation Age of Onset  . Heart disease Mother   . Hypertension Mother   . Heart failure Mother   . Heart disease Father   . Hypertension Sister   . Heart attack Neg Hx   . Stroke Neg Hx   . Colon cancer Neg Hx   . Esophageal cancer Neg Hx      Prior to Admission medications   Medication Sig Start Date End Date Taking? Authorizing Provider  albuterol (PROVENTIL) (2.5 MG/3ML) 0.083% nebulizer solution Take 3 mLs (2.5 mg total) by nebulization every 4 (four) hours as needed for wheezing or shortness of breath. 06/21/20   Tanda Rockers, MD  albuterol (VENTOLIN HFA) 108 (90 Base) MCG/ACT inhaler Inhale 2 puffs into the lungs every 6 (six) hours as needed for wheezing or shortness of breath.    [provider]  amiodarone (PACERONE) 200 MG tablet Take 0.5 tablets (100 mg total) by mouth daily. 08/28/20   Larey Dresser, MD  apixaban (ELIQUIS) 5 MG TABS tablet Take 1 tablet (5 mg total) by mouth 2 (two) times daily. Patient taking differently: Take 2.5 mg by mouth 2 (two) times daily.  08/29/19   Hosie Poisson, MD  Budeson-Glycopyrrol-Formoterol (BREZTRI AEROSPHERE) 160-9-4.8 MCG/ACT AERO Inhale 2 puffs into the lungs 2 (two) times daily. 06/21/20   Tanda Rockers, MD  Ensure Max Protein (ENSURE MAX PROTEIN) LIQD Take 330 mLs (11 oz total) by mouth daily. 08/29/19   Hosie Poisson, MD  famotidine (PEPCID) 20 MG tablet Take 1 tablet (20 mg total) by mouth 2 (two) times daily. 10/27/19   Larey Dresser, MD  FEROSUL 325 (65 Fe) MG tablet Take 325 mg by mouth 2 (two) times daily. 03/31/19   [provider]  fluticasone (FLONASE) 50 MCG/ACT nasal spray Place 2 sprays into both nostrils daily as needed for allergies.     [provider]  gabapentin (NEURONTIN) 300 MG capsule Take 1  capsule (300 mg total) by mouth 2 (two) times daily. Patient taking differently: Take 300 mg by mouth daily.  06/13/19   Swayze, Ava, DO  hydrocortisone (ANUSOL-HC) 2.5 % rectal cream Place 1 application rectally 2 (two) times daily as needed for hemorrhoids or itching.     [provider]  hydrocortisone 1 % lotion Apply 1 application topically See admin instructions. Apply small amount to back twice daily for itchy rash as needed    [provider]  insulin aspart (NOVOLOG) 100 UNIT/ML injection Inject 4 Units into the skin 3 (three) times daily with meals. 03/31/19   Kayleen Memos, DO  insulin aspart protamine - aspart (NOVOLOG MIX 70/30 FLEXPEN) (70-30) 100 UNIT/ML FlexPen Inject 0.1 mLs (10 Units total) into the skin 2 (two) times daily. 03/31/19   Kayleen Memos, DO  Insulin Syringe  27G X 1/2" 0.5 ML MISC 100 Syringes by Does not apply route 3 (three) times daily. 08/29/19   Hosie Poisson, MD  metolazone (ZAROXOLYN) 2.5 MG tablet Take 1 tablet (2.5 mg total) by mouth once a week. Every Wednesday 08/28/20   Larey Dresser, MD  oxybutynin (DITROPAN) 5 MG tablet Take 1 tablet (5 mg total) by mouth 4 (four) times daily. 06/13/19   Swayze, Ava, DO  oxyCODONE-acetaminophen (PERCOCET) 10-325 MG tablet Take 1 tablet by mouth every 6 (six) hours as needed for pain.  05/16/19   [provider]  OXYGEN Inhale 3 L into the lungs continuous.     [provider]  polyethylene glycol (MIRALAX / GLYCOLAX) 17 g packet Take 17 g by mouth daily as needed. 08/29/19   Hosie Poisson, MD  potassium chloride SA (KLOR-CON) 20 MEQ tablet Take 4 tablets (80 mEq total) by mouth daily. With additional 30mq when you take Metolazone. 07/27/20   MLarey Dresser MD  predniSONE (DELTASONE) 10 MG tablet Take 4 each am x 2 days,   2 each am x 2 days,  1 each am x 2 days and stop 09/19/20   WTanda Rockers MD  predniSONE (DELTASONE) 10 MG tablet Take 4 tablets (40 mg total) by mouth daily with  breakfast for 2 days, THEN 2 tablets (20 mg total) daily with breakfast for 2 days, THEN 1 tablet (10 mg total) daily with breakfast for 2 days. 09/25/20 10/01/20  WTanda Rockers MD  PRESCRIPTION MEDICATION Cpap    [provider]  rosuvastatin (CRESTOR) 20 MG tablet Take 20 mg by mouth at bedtime. 03/31/19   [provider]  senna-docusate (SENOKOT-S) 8.6-50 MG tablet Take 1 tablet by mouth at bedtime as needed for mild constipation. 08/29/19   AHosie Poisson MD  sodium chloride (OCEAN) 0.65 % SOLN nasal spray Place 2 sprays into both nostrils as needed for congestion.     [provider]  torsemide (DEMADEX) 20 MG tablet Take 4 tablets (80 mg total) by mouth 2 (two) times daily. 11/18/19   MLarey Dresser MD    Physical Exam: Wt Readings from Last 3 Encounters:  09/25/20 117.9 kg  08/28/20 112.9 kg  06/21/20 111.6 kg   Vitals:   09/25/20 1630 09/25/20 1645 09/25/20 1700 09/25/20 1715  BP: 117/63 116/62 119/63 136/70  Pulse: 70 65 66 70  Resp: _0 Temp:      TempSrc:      SpO2: 99% 99% 100% 98%  Weight:      Height:          Constitutional:  Calm & comfortable Eyes: PERRLA, lids and conjunctivae normal ENT:   He is not opening his mouth   Neck: Supple, no masses  Respiratory:  Clear to auscultation bilaterally  Normal respiratory effort.  Cardiovascular:  S1 & S2 heard, regular rate and rhythm No Murmurs Abdomen:  Non distended No tenderness, No masses Bowel sounds normal Extremities:  No clubbing / cyanosis 3+ pitting edema No joint deformity    Skin:  No rashes, lesions or ulcers Neurologic:  AAO x 2 CN 2-12 grossly intact Not moving extermities Psychiatric:  Cannot assess    Labs on Admission: I have personally reviewed following labs and imaging studies  CBC: Recent Labs  Lab 09/25/20 1401  WBC 5.6  NEUTROABS 3.9  HGB 10.4*  HCT 36.1*  MCV 89.8  PLT 3975  Basic Metabolic Panel: Recent Labs  Lab  09/25/20 1401  NA 144  K 4.4  CL 107  CO2 25  GLUCOSE 141*  BUN 53*  CREATININE 3.54*  CALCIUM 8.4*   GFR: Estimated Creatinine Clearance: 25 mL/min (A) (by C-G formula based on SCr of 3.54 mg/dL (H)). Liver Function Tests: Recent Labs  Lab 09/25/20 1401  AST 20  ALT 13  ALKPHOS 80  BILITOT 0.8  PROT 7.2  ALBUMIN 3.1*   No results for input(s): LIPASE, AMYLASE in the last 168 hours. No results for input(s): AMMONIA in the last 168 hours. Coagulation Profile: No results for input(s): INR, PROTIME in the last 168 hours. Cardiac Enzymes: No results for input(s): CKTOTAL, CKMB, CKMBINDEX, TROPONINI in the last 168 hours. BNP (last 3 results) No results for input(s): PROBNP in the last 8760 hours. HbA1C: No results for input(s): HGBA1C in the last 72 hours. CBG: No results for input(s): GLUCAP in the last 168 hours. Lipid Profile: No results for input(s): CHOL, HDL, LDLCALC, TRIG, CHOLHDL, LDLDIRECT in the last 72 hours. Thyroid Function Tests: No results for input(s): TSH, T4TOTAL, FREET4, T3FREE, THYROIDAB in the last 72 hours. Anemia Panel: No results for input(s): VITAMINB12, FOLATE, FERRITIN, TIBC, IRON, RETICCTPCT in the last 72 hours. Urine analysis:    Component Value Date/Time   COLORURINE STRAW (A) 07/08/2019 0008   APPEARANCEUR CLEAR 07/08/2019 0008   LABSPEC 1.006 07/08/2019 0008   PHURINE 6.0 07/08/2019 0008   GLUCOSEU NEGATIVE 07/08/2019 0008   HGBUR NEGATIVE 07/08/2019 0008   BILIRUBINUR NEGATIVE 07/08/2019 0008   KETONESUR NEGATIVE 07/08/2019 0008   PROTEINUR NEGATIVE 07/08/2019 0008   UROBILINOGEN 0.2 10/26/2014 2206   NITRITE NEGATIVE 07/08/2019 0008   LEUKOCYTESUR NEGATIVE 07/08/2019 0008   Sepsis Labs: _0 (procalcitonin:4,lacticidven:4) ) Recent Results (from the past 240 hour(s))  Respiratory Panel by RT PCR (Flu A&B, Covid) - Nasopharyngeal Swab     Status: None   Collection Time: 09/25/20  2:03 PM   Specimen: Nasopharyngeal  Swab; Nasopharyngeal(NP) swabs in vial transport medium  Result Value Ref Range Status   SARS Coronavirus 2 by RT PCR NEGATIVE NEGATIVE Final    Comment: (NOTE) SARS-CoV-2 target nucleic acids are NOT DETECTED.  The SARS-CoV-2 RNA is generally detectable in upper respiratoy specimens during the acute phase of infection. The lowest concentration of SARS-CoV-2 viral copies this assay can detect is 131 copies/mL. A negative result does not preclude SARS-Cov-2 infection and should not be used as the sole basis for treatment or other patient management decisions. A negative result may occur with  improper specimen collection/handling, submission of specimen other than nasopharyngeal swab, presence of viral mutation(s) within the areas targeted by this assay, and inadequate number of viral copies (<131 copies/mL). A negative result must be combined with clinical observations, patient history, and epidemiological information. The expected result is Negative.  Fact Sheet for Patients:  PinkCheek.be  Fact Sheet for Healthcare Providers:  GravelBags.it  This test is no t yet approved or cleared by the [place] FDA and  has been authorized for detection and/or diagnosis of SARS-CoV-2 by FDA under an Emergency Use Authorization (EUA). This EUA will remain  in effect (meaning this test can be used) for the duration of the COVID-19 declaration under Section 564(b)(1) of the Act, 21 U.S.C. section 360bbb-3(b)(1), unless the authorization is terminated or revoked sooner.     Influenza A by PCR NEGATIVE NEGATIVE Final   Influenza B by PCR NEGATIVE NEGATIVE Final    Comment: (NOTE) The Xpert Xpress SARS-CoV-2/FLU/RSV assay is intended  as an aid in  the diagnosis of influenza from Nasopharyngeal swab specimens and  should not be used as a sole basis for treatment. Nasal washings and  aspirates are unacceptable for Xpert Xpress  SARS-CoV-2/FLU/RSV  testing.  Fact Sheet for Patients: PinkCheek.be  Fact Sheet for Healthcare Providers: GravelBags.it  This test is not yet approved or cleared by the [place] FDA and  has been authorized for detection and/or diagnosis of SARS-CoV-2 by  FDA under an Emergency Use Authorization (EUA). This EUA will remain  in effect (meaning this test can be used) for the duration of the  Covid-19 declaration under Section 564(b)(1) of the Act, 21  U.S.C. section 360bbb-3(b)(1), unless the authorization is  terminated or revoked. Performed at [facility], [place] [address] [place], [place] [address]      Radiological Exams on Admission: DG Chest Portable 1 View  Result Date: 09/25/2020 CLINICAL DATA:  Shortness of breath EXAM: PORTABLE CHEST 1 VIEW COMPARISON:  06/21/2020 FINDINGS: Cardiomegaly. Atherosclerotic calcification of the aortic knob. Pulmonary vascular congestion with diffuse bilateral interstitial prominence. Patchy bibasilar opacities. Small bilateral pleural effusions. No pneumothorax. Degenerative changes of the right shoulder. IMPRESSION: 1. Findings suggestive of CHF with pulmonary edema. A superimposed infectious process would be difficult to exclude. 2. Small bilateral pleural effusions. Electronically Signed   By: Davina Poke D.O.   On: 09/25/2020 14:42    EKG: Independently reviewed. NSR- QTc 433  Assessment/Plan Principal Problem:   Acute on chronic respiratory failure     Acute on chronic combined systolic and diastolic CHF (congestive heart   failure), moderate RV failure   H/o OSA/ OHS and COPD on 3-4 L of O2 at home - last ECHO in 10/20 - per last cards note EF was 60-65%, had severe RV dilatation, pulmonary edema on CXR, pedal edema and elevated BNP at 3,275.6 - start 120 mg of Lasix TID - he follows with the heart failure team- for now will try the Lasix and hold off on calling  them - he appears over sedated and confused- check ABG STAT - no wheezing- does not appear to be COPD exacerbation - use BiPAP QHS, cont Albuterol Nebs PRN - repeat ECHO as last one was in 2020 - follow I and O, daily weights - reinforce importance of taking meds appropriately  Active Problems:   Acute encephalopathy - due to hypercarbia? - per the RN, the patient was very coherant about 1 hr ago  CKD 4 - appears to be at baseline- follow  Mildly elevated Troponin - likely due to cardiac strain in setting of severe renal disease     Type 2 diabetes mellitus with neuropathy - last A1c 7.4 in 10/20 - NPO for now until he is alert - use SSI Q 4 while NPO - hold Neurontin until alert    Morbid obesity  Body mass index is 35.26 kg/m.    PAF (paroxysmal atrial fibrillation)  - Amiodarone and Apixaban to be continued  Chronic pain - on Percocet 10/325 every 6 hrs- he received a dose in the ED- hold until more alert  Normocytic anemia - Hb has ranged from 8.8-11 in the past year  H/o PEA arrest in 10/20  DVT prophylaxis: Eliquis  Code Status: Full code  Family Communication: he lives with his wife- I have had a short conversation with her  Disposition Plan:  Remains inpatient appropriate because:acute respiratory failure thought to be due to acute CHF.   Consults called: none  Admission status: inpatient- based on his current severe condition I feel he will likely need a 2 midnight stay to reverse his respiratory failure and encephalopathy   Debbe Odea MD [facility] Pager: [email or web address] Password [email or web address] 7PM-7AM, please contact night-coverage   09/25/2020, 5:50 PM

## 2020-09-25 NOTE — ED Provider Notes (Signed)
[facility] EMERG[facility]MENT Provider Note   CSN: [id number] Arrival date & time: 09/25/20  1347     History Chief Complaint  Patient presents with  . Shortness of Breath    Nicholas Caldwell is a 74 y.o. male.  HPI Patient presents with shortness of breath.  On chronic oxygen at home at about 4 L.  Sats of 86%.  Improved with nonrebreather. has had more shortness of breath over around the last week.  Had been on steroids by pulmonology.  Also history of CHF.  States he has more fluid on his legs.  No chest pain.  No nausea or vomiting.    Past Medical History:  Diagnosis Date  . Atrial flutter ([place])    a. recurrent AFlutter with RVR;  b. Amiodarone Rx started 4/16  . CAD (coronary artery disease)    a. LHC 1/16:  mLAD diffuse disease, pLCx mild disease, dLCx with disease but too small for PCI, RCA ok, EF 25-30%  . Chronic pain   . Chronic systolic CHF (congestive heart failure) ([place])   . COPD (chronic obstructive pulmonary disease) ([place])   . Diabetes mellitus without complication ([place])   . Ex-smoker 11/2017  . Hypercholesteremia   . Hypertension   . NICM (nonischemic cardiomyopathy) ([place])    a.dx 2016. b. 2D echo 06/2016 - Last echo 07/01/16: mod dilated LV, mod LVH, EF 25-30%, mild-mod MR, sev LAE, mild-mod reduced RV systolic function, mild-mod TR, PASP 54mHG.  .Marland KitchenPAF (paroxysmal atrial fibrillation) (HCC)    On amio - ot a candidate for flecainide due to cardiomyopathy, not a candidate for Tikosyn due to prolonged QT, and felt to be a poor candidate for ablation given left atrial size.  . Pulmonary hypertension ([place]   . Tobacco abuse     Patient Active Problem List   Diagnosis Date Noted  . Acute respiratory failure with hypercapnia (HSouth T[place]   . Encounter for intubation   . Pressure injury of skin 08/21/2019  . Cardiac arrest (HLely Resort 08/20/2019  . Palliative care by specialist   . DNR (do not resuscitate) discussion   . Fatigue   . Acute CHF  (congestive heart failure) (HLadd 07/07/2019  . Acute on chronic heart failure (HEndicott 06/09/2019  . Lobar pneumonia (H[place] 04/14/2019  . Hyperkalemia 04/10/2019  . Cerebral embolism with cerebral infarction 03/21/2019  . Gastritis and gastroduodenitis   . NSVT (nonsustained ventricular tachycardia) (HPeralta 03/08/2019  . Tobacco abuse counseling 03/08/2019  . Iron deficiency anemia   . Acute on chronic systolic CHF (congestive heart failure) (HGlen Allen 03/06/2019  . Diabetes mellitus type 2 in obese (HSinai   . Primary osteoarthritis of right hip   . Hypomagnesemia   . Steroid-induced hyperglycemia   . Supplemental oxygen dependent   . Leukocytosis   . Hypokalemia   . Acute on chronic anemia   . Diabetic peripheral neuropathy (HPlymouth   . Acute on chronic systolic (congestive) heart failure (HRobesonia   . Acute on chronic respiratory failure (H[place] 01/14/2019  . Hypoxia   . Altered mental status   . Heart failure (HCurryville 12/30/2018  . AKI (acute kidney injury) (HFairhaven   . Acute respiratory failure (HSour John 11/22/2018  . Acute on chronic respiratory failure with hypoxia and hypercapnia (HAckley 11/13/2018  . Metabolic encephalopathy 099/83/3825 . Acute respiratory failure with hypoxia and hypercapnia (HNorth Lakeville 07/26/2018  . Acute exacerbation of CHF (congestive heart failure) (HLong Creek 07/26/2018  . CHF exacerbation (H[place]land City 05/01/2018  . CHF (congestive heart failure) (  [place]) 04/30/2018  . Chronic respiratory failure with hypoxia ([place]) 12/28/2017  . Acute encephalopathy   . Atrial fibrillation with RVR ([place])   . SVT (supraventricular tachycardia) ([place])   . COPD GOLD 0   . Medically noncompliant   . Panlobular emphysema ([place])   . OSA (obstructive sleep apnea)   . Pulmonary hypertension ([place])   . Disorientation   . Pressure injury of skin 09/07/2016  . Acute on chronic combined systolic and diastolic CHF (congestive heart failure) ([place]) 09/05/2016  . Skin lesion-left heal 09/05/2016  . Chronic systolic CHF (congestive  heart failure) ([place]) 07/16/2016  . COPD (chronic obstructive pulmonary disease) ([place]) 07/16/2016  . Uncontrolled type 2 diabetes mellitus with complication ([place])   . Diabetic polyneuropathy associated with diabetes mellitus due to underlying condition ([place])   . Normocytic anemia 06/29/2016  . CAD - Non-obstructive by LHC1/16 01/21/2016  . Prolonged QT interval 10/09/2015  . Nonischemic cardiomyopathy ([place]) 10/09/2015  . PAF (paroxysmal atrial fibrillation) ([place])   . Chronic pain 02/19/2015  . Morbid obesity ([place]) 02/13/2015  . Dyspnea   . Elevated troponin I level 11/01/2014  . COPD exacerbation ([place]) 11/01/2014  . Essential hypertension 10/31/2014  . Type 2 diabetes mellitus with neuropathy 10/31/2014  . Hyperlipidemia  10/31/2014  . Cigarette smoker 10/31/2014    Past Surgical History:  Procedure Laterality Date  . BIOPSY  03/11/2019   Procedure: BIOPSY;  Surgeon: Lavena Bullion, DO;  Location: [place];  Service: Gastroenterology;;  . BIOPSY  03/15/2019   Procedure: BIOPSY;  Surgeon: Irving Copas., MD;  Location: [place];  Service: Gastroenterology;;  . CARDIOVERSION N/A 07/30/2018   Procedure: CARDIOVERSION;  Surgeon: Larey Dresser, MD;  Location: [facility] ENDOSCOPY;  Service: Cardiovascular;  Laterality: N/A;  . COLONOSCOPY N/A 03/11/2019   Procedure: COLONOSCOPY;  Surgeon: Lavena Bullion, DO;  Location: [place];  Service: Gastroenterology;  Laterality: N/A;  . ENTEROSCOPY N/A 03/15/2019   Procedure: ENTEROSCOPY;  Surgeon: Rush Landmark Telford Nab., MD;  Location: [place];  Service: Gastroenterology;  Laterality: N/A;  . ESOPHAGOGASTRODUODENOSCOPY Left 03/11/2019   Procedure: ESOPHAGOGASTRODUODENOSCOPY (EGD);  Surgeon: Lavena Bullion, DO;  Location: [facility] ENDOSCOPY;  Service: Gastroenterology;  Laterality: Left;  . LEFT AND RIGHT HEART CATHETERIZATION WITH CORONARY ANGIOGRAM N/A 11/06/2014   Procedure: LEFT AND RIGHT HEART CATHETERIZATION WITH CORONARY  ANGIOGRAM;  Surgeon: Jettie Booze, MD;  Location: [facility] CATH LAB;  Service: Cardiovascular;  Laterality: N/A;  . RIGHT/LEFT HEART CATH AND CORONARY ANGIOGRAPHY N/A 12/06/2018   Procedure: RIGHT/LEFT HEART CATH AND CORONARY ANGIOGRAPHY;  Surgeon: Larey Dresser, MD;  Location: [place] CV LAB;  Service: Cardiovascular;  Laterality: N/A;  . SPLENECTOMY    . SUBMUCOSAL TATTOO INJECTION  03/15/2019   Procedure: SUBMUCOSAL TATTOO INJECTION;  Surgeon: Rush Landmark Telford Nab., MD;  Location: [facility] ENDOSCOPY;  Service: Gastroenterology;;       Family History  Problem Relation Age of Onset  . Heart disease Mother   . Hypertension Mother   . Heart failure Mother   . Heart disease Father   . Hypertension Sister   . Heart attack Neg Hx   . Stroke Neg Hx   . Colon cancer Neg Hx   . Esophageal cancer Neg Hx     Social History   Tobacco Use  . Smoking status: Former Smoker    Packs/day: 1.00    Years: 34.00    Pack years: 34.00    Types: Cigarettes    Quit date: 11/30/2018  Years since quitting: 1.8  . Smokeless tobacco: Never Used  Vaping Use  . Vaping Use: Never used  Substance Use Topics  . Alcohol use: No    Alcohol/week: 0.0 standard drinks  . Drug use: No    Home Medications Prior to Admission medications   Medication Sig Start Date End Date Taking? Authorizing Provider  albuterol (PROVENTIL) (2.5 MG/3ML) 0.083% nebulizer solution Take 3 mLs (2.5 mg total) by nebulization every 4 (four) hours as needed for wheezing or shortness of breath. 06/21/20   Tanda Rockers, MD  albuterol (VENTOLIN HFA) 108 (90 Base) MCG/ACT inhaler Inhale 2 puffs into the lungs every 6 (six) hours as needed for wheezing or shortness of breath.    [provider]  amiodarone (PACERONE) 200 MG tablet Take 0.5 tablets (100 mg total) by mouth daily. 08/28/20   Larey Dresser, MD  apixaban (ELIQUIS) 5 MG TABS tablet Take 1 tablet (5 mg total) by mouth 2 (two) times daily. Patient taking  differently: Take 2.5 mg by mouth 2 (two) times daily.  08/29/19   Hosie Poisson, MD  Budeson-Glycopyrrol-Formoterol (BREZTRI AEROSPHERE) 160-9-4.8 MCG/ACT AERO Inhale 2 puffs into the lungs 2 (two) times daily. 06/21/20   Tanda Rockers, MD  Ensure Max Protein (ENSURE MAX PROTEIN) LIQD Take 330 mLs (11 oz total) by mouth daily. 08/29/19   Hosie Poisson, MD  famotidine (PEPCID) 20 MG tablet Take 1 tablet (20 mg total) by mouth 2 (two) times daily. 10/27/19   Larey Dresser, MD  FEROSUL 325 (65 Fe) MG tablet Take 325 mg by mouth 2 (two) times daily. 03/31/19   [provider]  fluticasone (FLONASE) 50 MCG/ACT nasal spray Place 2 sprays into both nostrils daily as needed for allergies.     [provider]  gabapentin (NEURONTIN) 300 MG capsule Take 1 capsule (300 mg total) by mouth 2 (two) times daily. Patient taking differently: Take 300 mg by mouth daily.  06/13/19   Swayze, Ava, DO  hydrocortisone (ANUSOL-HC) 2.5 % rectal cream Place 1 application rectally 2 (two) times daily as needed for hemorrhoids or itching.     [provider]  hydrocortisone 1 % lotion Apply 1 application topically See admin instructions. Apply small amount to back twice daily for itchy rash as needed    [provider]  insulin aspart (NOVOLOG) 100 UNIT/ML injection Inject 4 Units into the skin 3 (three) times daily with meals. 03/31/19   Kayleen Memos, DO  insulin aspart protamine - aspart (NOVOLOG MIX 70/30 FLEXPEN) (70-30) 100 UNIT/ML FlexPen Inject 0.1 mLs (10 Units total) into the skin 2 (two) times daily. 03/31/19   Kayleen Memos, DO  Insulin Syringe 27G X 1/2" 0.5 ML MISC 100 Syringes by Does not apply route 3 (three) times daily. 08/29/19   Hosie Poisson, MD  metolazone (ZAROXOLYN) 2.5 MG tablet Take 1 tablet (2.5 mg total) by mouth once a week. Every Wednesday 08/28/20   Larey Dresser, MD  oxybutynin (DITROPAN) 5 MG tablet Take 1 tablet (5 mg total) by mouth 4 (four) times daily.  06/13/19   Swayze, Ava, DO  oxyCODONE-acetaminophen (PERCOCET) 10-325 MG tablet Take 1 tablet by mouth every 6 (six) hours as needed for pain.  05/16/19   [provider]  OXYGEN Inhale 3 L into the lungs continuous.     [provider]  polyethylene glycol (MIRALAX / GLYCOLAX) 17 g packet Take 17 g by mouth daily as needed. 08/29/19   Karleen Hampshire,  Jeoffrey Massed, MD  potassium chloride SA (KLOR-CON) 20 MEQ tablet Take 4 tablets (80 mEq total) by mouth daily. With additional 39mq when you take Metolazone. 07/27/20   MLarey Dresser MD  predniSONE (DELTASONE) 10 MG tablet Take 4 each am x 2 days,   2 each am x 2 days,  1 each am x 2 days and stop 09/19/20   WTanda Rockers MD  predniSONE (DELTASONE) 10 MG tablet Take 4 tablets (40 mg total) by mouth daily with breakfast for 2 days, THEN 2 tablets (20 mg total) daily with breakfast for 2 days, THEN 1 tablet (10 mg total) daily with breakfast for 2 days. 09/25/20 10/01/20  WTanda Rockers MD  PRESCRIPTION MEDICATION Cpap    [provider]  rosuvastatin (CRESTOR) 20 MG tablet Take 20 mg by mouth at bedtime. 03/31/19   [provider]  senna-docusate (SENOKOT-S) 8.6-50 MG tablet Take 1 tablet by mouth at bedtime as needed for mild constipation. 08/29/19   AHosie Poisson MD  sodium chloride (OCEAN) 0.65 % SOLN nasal spray Place 2 sprays into both nostrils as needed for congestion.     [provider]  torsemide (DEMADEX) 20 MG tablet Take 4 tablets (80 mg total) by mouth 2 (two) times daily. 11/18/19   MLarey Dresser MD    Allergies    Bidil [isosorb dinitrate-hydralazine]  Review of Systems   Review of Systems  Constitutional: Positive for appetite change and fatigue.  HENT: Negative for congestion.   Respiratory: Positive for shortness of breath. Negative for wheezing and stridor.   Cardiovascular: Positive for leg swelling. Negative for chest pain.  Gastrointestinal: Positive for abdominal distention.   Genitourinary: Negative for decreased urine volume.  Musculoskeletal: Negative for back pain.  Skin: Negative for wound.  Neurological: Negative for weakness.  Psychiatric/Behavioral: Negative for confusion.    Physical Exam Updated Vital Signs BP 134/66   Pulse 70   Temp 98.5 F (36.9 C) (Oral)   Resp 20   Ht 6' (1.829 m)   Wt 117.9 kg   SpO2 98%   BMI 35.26 kg/m   Physical Exam Vitals and nursing note reviewed.  HENT:     Head: Normocephalic and atraumatic.  Cardiovascular:     Rate and Rhythm: Regular rhythm.  Pulmonary:     Comments: Rales bilateral lung fields. Chest:     Chest wall: No tenderness.  Abdominal:     Comments: Mild distention.  Sounds somewhat gaseous.  Musculoskeletal:     Right lower leg: Edema present.     Left lower leg: Edema present.     Comments: Pitting edema bilateral lower extremities.  Skin:    General: Skin is warm.     Capillary Refill: Capillary refill takes less than 2 seconds.  Neurological:     Mental Status: He is alert and oriented to person, place, and time.     ED Results / Procedures / Treatments   Labs (all labs ordered are listed, but only abnormal results are displayed) Labs Reviewed  CBC WITH DIFFERENTIAL/PLATELET - Abnormal; Notable for the following components:      Result Value   RBC 4.02 (*)    Hemoglobin 10.4 (*)    HCT 36.1 (*)    MCH 25.9 (*)    MCHC 28.8 (*)    RDW 20.1 (*)    nRBC 0.4 (*)    Lymphs Abs 0.4 (*)    Monocytes Absolute 1.2 (*)    All other components  within normal limits  BASIC METABOLIC PANEL - Abnormal; Notable for the following components:   Glucose, Bld 141 (*)    BUN 53 (*)    Creatinine, Ser 3.54 (*)    Calcium 8.4 (*)    GFR, Estimated 18 (*)    All other components within normal limits  HEPATIC FUNCTION PANEL - Abnormal; Notable for the following components:   Albumin 3.1 (*)    All other components within normal limits  TROPONIN I (HIGH SENSITIVITY) - Abnormal; Notable  for the following components:   Troponin I (High Sensitivity) 78 (*)    All other components within normal limits  RESPIRATORY PANEL BY RT PCR (FLU A&B, COVID)  BRAIN NATRIURETIC PEPTIDE  TROPONIN I (HIGH SENSITIVITY)    EKG EKG Interpretation  Date/Time:  Tuesday September 25 2020 13:58:35 EST Ventricular Rate:  74 PR Interval:    QRS Duration: 115 QT Interval:  390 QTC Calculation: 433 R Axis:   122 Text Interpretation: Sinus rhythm Prolonged PR interval Incomplete right bundle branch block Probable lateral infarct, age indeterminate Anteroseptal infarct, age indeterminate Confirmed by Davonna Belling [phone]) on 09/25/2020 2:24:35 PM   Radiology DG Chest Portable 1 View  Result Date: 09/25/2020 CLINICAL DATA:  Shortness of breath EXAM: PORTABLE CHEST 1 VIEW COMPARISON:  06/21/2020 FINDINGS: Cardiomegaly. Atherosclerotic calcification of the aortic knob. Pulmonary vascular congestion with diffuse bilateral interstitial prominence. Patchy bibasilar opacities. Small bilateral pleural effusions. No pneumothorax. Degenerative changes of the right shoulder. IMPRESSION: 1. Findings suggestive of CHF with pulmonary edema. A superimposed infectious process would be difficult to exclude. 2. Small bilateral pleural effusions. Electronically Signed   By: Davina Poke D.O.   On: 09/25/2020 14:42    Procedures Procedures (including critical care time)  Medications Ordered in ED Medications - No data to display  ED Course  I have reviewed the triage vital signs and the nursing notes.  Pertinent labs & imaging results that were available during my care of the patient were reviewed by me and considered in my medical decision making (see chart for details).    MDM Rules/Calculators/A&P                         Patient presents with shortness of breath.  Increased oxygen requirement.  Likely CHF.  X-ray shows volume overload.  Needs nonrebreather now compared to his normal 4 L.  Will  require admission the hospital.  Care turned over to Dr. Stark Jock.  Final Clinical Impression(s) / ED Diagnoses Final diagnoses:  Congestive heart failure, unspecified HF chronicity, unspecified heart failure type [facility])    Rx / DC Orders ED Discharge Orders    None       Davonna Belling, MD 09/25/20 1601

## 2020-09-26 ENCOUNTER — Inpatient Hospital Stay (HOSPITAL_COMMUNITY): Payer: No Typology Code available for payment source

## 2020-09-26 DIAGNOSIS — I5021 Acute systolic (congestive) heart failure: Secondary | ICD-10-CM | POA: Diagnosis not present

## 2020-09-26 DIAGNOSIS — J9622 Acute and chronic respiratory failure with hypercapnia: Secondary | ICD-10-CM

## 2020-09-26 DIAGNOSIS — G9340 Encephalopathy, unspecified: Secondary | ICD-10-CM | POA: Diagnosis not present

## 2020-09-26 DIAGNOSIS — I5043 Acute on chronic combined systolic (congestive) and diastolic (congestive) heart failure: Secondary | ICD-10-CM

## 2020-09-26 DIAGNOSIS — J9602 Acute respiratory failure with hypercapnia: Secondary | ICD-10-CM

## 2020-09-26 DIAGNOSIS — J42 Unspecified chronic bronchitis: Secondary | ICD-10-CM

## 2020-09-26 DIAGNOSIS — G4733 Obstructive sleep apnea (adult) (pediatric): Secondary | ICD-10-CM

## 2020-09-26 DIAGNOSIS — I10 Essential (primary) hypertension: Secondary | ICD-10-CM

## 2020-09-26 DIAGNOSIS — I480 Paroxysmal atrial fibrillation: Secondary | ICD-10-CM

## 2020-09-26 DIAGNOSIS — E1140 Type 2 diabetes mellitus with diabetic neuropathy, unspecified: Secondary | ICD-10-CM

## 2020-09-26 DIAGNOSIS — Z794 Long term (current) use of insulin: Secondary | ICD-10-CM

## 2020-09-26 DIAGNOSIS — J9621 Acute and chronic respiratory failure with hypoxia: Secondary | ICD-10-CM

## 2020-09-26 DIAGNOSIS — J9601 Acute respiratory failure with hypoxia: Secondary | ICD-10-CM | POA: Diagnosis not present

## 2020-09-26 LAB — GLUCOSE, CAPILLARY
Glucose-Capillary: 94 mg/dL (ref 70–99)
Glucose-Capillary: 80 mg/dL (ref 70–99)
Glucose-Capillary: 68 mg/dL — ABNORMAL LOW (ref 70–99)
Glucose-Capillary: 100 mg/dL — ABNORMAL HIGH (ref 70–99)
Glucose-Capillary: 150 mg/dL — ABNORMAL HIGH (ref 70–99)
Glucose-Capillary: 148 mg/dL — ABNORMAL HIGH (ref 70–99)

## 2020-09-26 LAB — BASIC METABOLIC PANEL
Anion gap: 13 (ref 5–15)
BUN: 54 mg/dL — ABNORMAL HIGH (ref 8–23)
CO2: 22 mmol/L (ref 22–32)
Calcium: 8.2 mg/dL — ABNORMAL LOW (ref 8.9–10.3)
Chloride: 107 mmol/L (ref 98–111)
Creatinine, Ser: 3.61 mg/dL — ABNORMAL HIGH (ref 0.61–1.24)
GFR, Estimated: 17 mL/min — ABNORMAL LOW (ref >60)
Glucose, Bld: 112 mg/dL — ABNORMAL HIGH (ref 70–99)
Potassium: 4.6 mmol/L (ref 3.5–5.1)
Sodium: 142 mmol/L (ref 135–145)

## 2020-09-26 LAB — ECHOCARDIOGRAM COMPLETE
Height: 72 in
S' Lateral: 4.1 cm
Weight: 4292.8 oz

## 2020-09-26 LAB — BLOOD GAS, ARTERIAL
Acid-base deficit: 0.4 mmol/L (ref 0.0–2.0)
Bicarbonate: 26.5 mmol/L (ref 20.0–28.0)
FIO2: 50
O2 Saturation: 94.1 %
Patient temperature: 36.5
pCO2 arterial: 66.3 mmHg (ref 32.0–48.0)
pH, Arterial: 7.223 — ABNORMAL LOW (ref 7.350–7.450)
pO2, Arterial: 76.8 mmHg — ABNORMAL LOW (ref 83.0–108.0)

## 2020-09-26 LAB — RAPID URINE DRUG SCREEN, HOSP PERFORMED
Amphetamines: NOT DETECTED
Barbiturates: NOT DETECTED
Benzodiazepines: NOT DETECTED
Cocaine: NOT DETECTED
Opiates: NOT DETECTED
Tetrahydrocannabinol: NOT DETECTED

## 2020-09-26 LAB — PROCALCITONIN: Procalcitonin: 0.31 ng/mL

## 2020-09-26 MED ORDER — FENTANYL CITRATE (PF) 100 MCG/2ML IJ SOLN
25.0000 ug | INTRAMUSCULAR | Status: AC | PRN
  Administered 2020-09-26: 25 ug via INTRAVENOUS
  Filled 2020-09-26: qty 2

## 2020-09-26 MED ORDER — ACETAMINOPHEN 10 MG/ML IV SOLN
1000.0000 mg | Freq: Four times a day (QID) | INTRAVENOUS | Status: AC
  Administered 2020-09-26 – 2020-09-27 (×3): 1000 mg via INTRAVENOUS
  Filled 2020-09-26 (×4): qty 100

## 2020-09-26 NOTE — Progress Notes (Signed)
PROGRESS NOTE  Nicholas Caldwell QAS:[id number] DOB: [birth year] DOA: 09/25/2020 PCP: [name], MD  Brief History   Chief Complaint: brought in for shortness of breath  HPI: Nicholas Caldwell is a 74 y.o. male with medical history of NISCM, systolic CHF and mod RV failure, DM2, CKD 4, PAF s/p DCCV, COPD on home O2, OHS/ OSA on 3 L O2 and CPAP & CAD.  The patient is currently sleepy and unable to give me a detailed history. He knows he is at [facility] and know his age but will not tell me why he is in the hospital and falls back to sleep. Per ED notes, he has been more short of breath for about 1 wk and has more fluid on his legs.   ED Course: Pulse ox was 86% on 4 L. Stayed in 80s on 6 L. Subsequently placed on a non re-breather- pulse ox has been 100%. Given 40 mg IV Lasix. CXR reveals pulmonary edema.   ABG this am demonstrated continued respiratory acidosis and hypercapnea. Pulmonology was consulted. I appreciated his helped.  Consultants  . Pulmonology  Procedures  . None  Antibiotics   Anti-infectives (From admission, onward)   None    .  Subjective  The patient is lying in bed on BIPAP. He has discussed with nursing his wish to remove BIPAP and to leave the hospital AMA. He has been counseled strongly against this.  Objective   Vitals:  Vitals:   09/26/20 1200 09/26/20 1300  BP: 124/67 131/72  Pulse: 70 78  Resp: 16   Temp: 98.2 F (36.8 C)   SpO2: 93% 93%    Exam:  Constitutional:  . The patient is awake, alert, and oriented x 3. No acute distress. Respiratory:  . No increased work of breathing. . No wheezes or rhonchi . Positive for rales at bases bilaterally . Diminished breath sounds bilaterally . No tactile fremitus Cardiovascular:  . Regular rate and rhythm . No murmurs, ectopy, or gallups. . No lateral PMI. No thrills. Abdomen:  . Abdomen is soft, non-tender, non-distended . No hernias, masses, or organomegaly . Normoactive bowel sounds.   Musculoskeletal:  . No cyanosis, clubbing, or edema Skin:  . No rashes, lesions, ulcers . palpation of skin: no induration or nodules Neurologic:  . CN 2-12 intact . Sensation all 4 extremities intact Psychiatric:  . Mental status o Mood, affect appropriate o Orientation to person, place, time  . judgment and insight appear intact   I have personally reviewed the following:   Today's Data  . Vitals, ABG, CBC, BMP, procalcitonin  Imaging  . CXR  Scheduled Meds: . amiodarone  100 mg Oral Daily  . apixaban  5 mg Oral BID  . insulin aspart  0-9 Units Subcutaneous Q4H  . sodium chloride flush  3 mL Intravenous Q12H   Continuous Infusions: . sodium chloride    . acetaminophen 1,000 mg (09/26/20 1010)  . furosemide 120 mg (09/26/20 0532)    Principal Problem:   Acute respiratory failure (HCC) Active Problems:   Essential hypertension   Type 2 diabetes mellitus with neuropathy   Morbid obesity (HCC)   PAF (paroxysmal atrial fibrillation) (HCC)   COPD (chronic obstructive pulmonary disease) (HCC)   Acute on chronic combined systolic and diastolic CHF (congestive heart failure) (HCC)   OSA (obstructive sleep apnea)   Acute encephalopathy   Acute respiratory failure with hypoxia and hypercapnia (HCC)   Acute systolic CHF (congestive heart failure) ([place])   LOS: 1 day  A & P  Acute on chronic hypoxic and hypercapneic respiratory failure: The patient was admitted with respiratory acidosis and hypoxia that required BIPAP. Repeat ABG with the patient on BIPAP demonstrated somewhat improved, but continued respiratory acidosis and hypercarbia. Pulmonology was consulted and adjustments were made to the BIPAP settings. Will continue to monitor.  Acute on chronic combined systolic and diastolic CHF (congestive heart   failure) with severe RV systolic failure and dilatation: The patient is on 3-4 L of O2 at home. He presented with pulmonary edema, pedal edema and BNP elevation of  3275. He is receiving diuresis with lasix 120 mg tid. As last echocardiogram was performed in 08/2019, it was repeated. Today's study report is pending. I's and O's are being following closely.   Acute encephalopathy: Due to hypercarbia and possibly Neurontin which has been held. Improved mental status, although still with hypercarbia.   CKD 4: Creatinine/GFR appears to be at baseline. Monitor creatinine, electrolytes, and monitor volume status.   Mildly elevated Troponin: Likely due to cardiac strain in setting of severe renal disease   Type 2 diabetes mellitus with neuropathy: HBA1c of 09/25/2020 was 6.7.   Morbid obesity: This complicates all cares. Body mass index is 35.26 kg/m.  PAF (paroxysmal atrial fibrillation): Sinus rhythm per EKG 09/25/2020. Monitor on telemetry. Amiodarone and Apixaban to be continued.  Chronic pain: Narcotics are held as them may depress respiration and worsen respiratory acidosis and hypercarbia. He is receiving IV acetaminophen. Well restart when hypercarbia/respiratory acidosis is resolved.  Normocytic anemia: Hb has ranged from 8.8-11 in the past year  H/o PEA arrest in 10/20: Noted. Monitor on telemetry.  I have seen and examined this patient myself. I have spent 36 minutes in his evaluation and care.  DVT prophylaxis: Eliquis  Code Status: Full code  Family Communication: None is available. Disposition Plan:  Status is: Inpatient  Remains inpatient appropriate because:Inpatient level of care appropriate due to severity of illness   Dispo: The patient is from: Home              Anticipated d/c is to: TBD              Anticipated d/c date is: 3 days              Patient currently is not medically stable to d/c.  Adekunle Rohrbach, DO [facility] Direct contact: see [email or web address]  7PM-7AM contact night coverage as above 09/26/2020, 3:37 PM  LOS: 1 day

## 2020-09-26 NOTE — Progress Notes (Signed)
Echocardiogram 2D Echocardiogram has been performed.  [name] 09/26/2020, 1:19 PM

## 2020-09-26 NOTE — Progress Notes (Signed)
PT Cancellation Note  Patient Details Name: Nicholas Caldwell MRN: [id number] DOB: 11/26/1945   Cancelled Treatment:    Reason Eval/Treat Not Completed: Medical issues which prohibited therapy. Upon PT arrival pt reporting difficulty breathing, sats at 90% on 6L [place], requesting to be placed on NRB mask. PT will hold mobility until pt's respiratory status is more stable.   Zenaida Niece 09/26/2020, 5:27 PM

## 2020-09-26 NOTE — Plan of Care (Signed)

## 2020-09-26 NOTE — Consult Note (Signed)
NAME:  Nicholas Caldwell, MRN:  [id number], DOB:  Dec 16, 1945, LOS: 1 ADMISSION DATE:  09/25/2020, CONSULTATION DATE: 09/26/2020 REFERRING MD: [facility], CHIEF COMPLAINT: Acute on chronic hypercarbic respiratory failure  Brief History   74 year old male with known COPD congestive heart failure who presents with worsening respiratory status  History of present illness   74 year old male who is a pulmonary patient of Dr. Melvyn Novas and called his office 5 days prior to admission with increasing shortness of breath refused a televisit.  He was given steroids over the phone  and did not improve presented to the [facility] emergency department 09/25/2020 with worsening shortness of breath and hypercarbia along with worsening renal function. He has an extensive past medical history that is well documented in pertinent for cardiac arrest, congestive heart failure for which she is on chronic diuretics, paroxysmal atrial fibrillation for which he is on Eliquis, diabetes mellitus type 2 for which he is on insulin. Admitted 09/25/2020 noted to have worsening renal function and was aggressively diuresed and placed on noninvasive mechanical ventilatory support.  Chest x-ray is consistent with congestive heart failure which he is normally on Lasix daily.  Last 2D echo in 2020 reveals EF of 60% and severe right ventricular enlargement. 09/26/2020 pulmonary critical care consulted and evaluated the patient at the bedside.  He has improved with aggressive diuresis current currently on -1000 cc.  Awake alert requesting pain medication.  Not give him narcotics analgesic at this time consider nonnarcotics analgesics until he sobered this crisis situation. He can remain in progressive care unit at this time.  Should he worsen pulmonary critical care will be glad to transfer to intensive care unit at that time.  Past Medical History   Past Medical History:  Diagnosis Date  . Atrial flutter ([place])    a. recurrent AFlutter with RVR;  b.  Amiodarone Rx started 4/16  . CAD (coronary artery disease)    a. LHC 1/16:  mLAD diffuse disease, pLCx mild disease, dLCx with disease but too small for PCI, RCA ok, EF 25-30%  . Chronic pain   . Chronic systolic CHF (congestive heart failure) ([place])   . COPD (chronic obstructive pulmonary disease) ([place])   . Diabetes mellitus without complication ([place])   . Ex-smoker 11/2017  . Hypercholesteremia   . Hypertension   . NICM (nonischemic cardiomyopathy) ([place])    a.dx 2016. b. 2D echo 06/2016 - Last echo 07/01/16: mod dilated LV, mod LVH, EF 25-30%, mild-mod MR, sev LAE, mild-mod reduced RV systolic function, mild-mod TR, PASP 27mHG.  .Marland KitchenPAF (paroxysmal atrial fibrillation) (HCC)    On amio - ot a candidate for flecainide due to cardiomyopathy, not a candidate for Tikosyn due to prolonged QT, and felt to be a poor candidate for ablation given left atrial size.  . [place]monary [place] ([place]   . Tobacco abuse      Significant Hospital Events     Consults:  11/27/2019 pulmonary  Procedures:    Significant Diagnostic Tests:  09/25/2020 chest x-ray 1 change in the chronic COPD along with volume overload  Micro Data:    Antimicrobials:    Interim history/subjective:  09/26/2020 evaluated at bedside.  No acute distress.  No need to transfer to the intensive care unit.  He is improved with aggressive diuresis.  Pulmonary critical care will continue to follow  Objective   Blood pressure 115/61, pulse 67, temperature 98.2 F (36.8 C), temperature source Axillary, resp. rate 19, height 6' (1.829 m), weight 121.7 kg, SpO2  95 %.        Intake/Output Summary (Last 24 hours) at 09/26/2020 3329 Last data filed at 09/26/2020 0355 Gross per 24 hour  Intake --  Output 1000 ml  Net -1000 ml   Filed Weights   09/25/20 1354 09/25/20 2100 09/26/20 0400  Weight: 117.9 kg 122.3 kg 121.7 kg    Examination: General: Obese male currently on noninvasive mechanical dilatory support HENT:  Radial lymphadenopathy is appreciated Lungs: Creased air movement throughout Cardiovascular: Heart sounds are distant but regular Abdomen: Obese soft positive bowel sounds Extremities: 1-2+ edema Neuro: Grossly intact without focal defect.  Awake requesting pain medication GU: Condom cath in place with amber clear urine  Resolved Hospital Problem list     Assessment & Plan:  Acute on chronic hypercarbic respiratory failure exacerbated by worsening renal function coupled with congestive heart failure. COPD Continue noninvasive mechanical ventilatory support Aggressive diuresis as tolerated Bronchodilators Nocturnal noninvasive mechanical ventilatory support and as needed  Worsening renal function in setting of stage III kidney disease Lab Results  Component Value Date   CREATININE 3.61 (H) 09/26/2020   CREATININE 3.54 (H) 09/25/2020   CREATININE 2.98 (H) 08/28/2020   CREATININE 0.76 03/24/2016   CREATININE 0.79 10/01/2015   CREATININE 0.80 09/12/2015   Diuresis as tolerated Consider nephrology consult  Congestive heart failure with a EF of 60% and severe right ventricular enlargement Paroxysmal atrial fibrillation Artery disease status post coronary bypass graft Consider cardiology consult Continue anticoagulation Consider repeat 2D echo  Diabetes mellitus type 2 CBG (last 3)  Recent Labs    09/25/20 2344 09/26/20 0400 09/26/20 0750  GLUCAP 106* 94 80    Sliding-scale insulin protocol  Chronic pain Hold narcotic analgesics and use nonnarcotic analgesia   Best practice (evaluated daily)   Diet: NPO Pain/Anxiety/Delirium protocol (if indicated): Hold narcotics nonnarcotic analgesics VAP protocol (if indicated): Not required DVT prophylaxis: Currently fully anticoagulated GI prophylaxis: PPI Glucose control: Sliding scale insulin protocol Mobility: Bedrest last date of multidisciplinary goals of care discussion 09/26/2020 stated he 1 to be a full code at  this time. Family and staff present S. Sallie Staron, ACNP Summary of discussion full code Follow up goals of care discussion due 1 week Code Status: Full Disposition: Remain in stepdown unit  Labs   CBC: Recent Labs  Lab 09/25/20 1401 09/25/20 1837  WBC 5.6  --   NEUTROABS 3.9  --   HGB 10.4* 12.2*  HCT 36.1* 36.0*  MCV 89.8  --   PLT 328  --     Basic Metabolic Panel: Recent Labs  Lab 09/25/20 1401 09/25/20 1837 09/26/20 0102  NA 144 145 142  K 4.4 4.6 4.6  CL 107  --  107  CO2 25  --  22  GLUCOSE 141*  --  112*  BUN 53*  --  54*  CREATININE 3.54*  --  3.61*  CALCIUM 8.4*  --  8.2*   GFR: Estimated Creatinine Clearance: 24.9 mL/min (A) (by C-G formula based on SCr of 3.61 mg/dL (H)). Recent Labs  Lab 09/25/20 1401  WBC 5.6    Liver Function Tests: Recent Labs  Lab 09/25/20 1401  AST 20  ALT 13  ALKPHOS 80  BILITOT 0.8  PROT 7.2  ALBUMIN 3.1*   No results for input(s): LIPASE, AMYLASE in the last 168 hours. No results for input(s): AMMONIA in the last 168 hours.  ABG    Component Value Date/Time   PHART 7.223 (L) 09/26/2020 0618   PCO2ART  66.3 (HH) 09/26/2020 0618   PO2ART 76.8 (L) 09/26/2020 0618   HCO3 26.5 09/26/2020 0618   TCO2 29 09/25/2020 1837   ACIDBASEDEF 0.4 09/26/2020 0618   O2SAT 94.1 09/26/2020 0618     Coagulation Profile: No results for input(s): INR, PROTIME in the last 168 hours.  Cardiac Enzymes: No results for input(s): CKTOTAL, CKMB, CKMBINDEX, TROPONINI in the last 168 hours.  HbA1C: Hgb A1c MFr Bld  Date/Time Value Ref Range Status  09/25/2020 10:03 PM 6.7 (H) 4.8 - 5.6 % Final    Comment:    (NOTE) Pre diabetes:          5.7%-6.4%  Diabetes:              >6.4%  Glycemic control for   <7.0% adults with diabetes   08/21/2019 12:17 AM 7.4 (H) 4.8 - 5.6 % Final    Comment:    (NOTE)         Prediabetes: 5.7 - 6.4         Diabetes: >6.4         Glycemic control for adults with diabetes: <7.0     CBG: Recent  Labs  Lab 09/25/20 1849 09/25/20 1938 09/25/20 2344 09/26/20 0400 09/26/20 0750  GLUCAP 134* 117* 106* 94 80    Review of Systems:   10 point review of system taken, please see HPI for positives and negatives.   Past Medical History  He,  has a past medical history of Atrial flutter ([place]), CAD (coronary artery disease), Chronic pain, Chronic systolic CHF (congestive heart failure) ([place]), COPD (chronic obstructive pulmonary disease) ([place]), Diabetes mellitus without complication ([place]), Ex-smoker (11/2017), Hypercholesteremia, Hypertension, NICM (nonischemic cardiomyopathy) ([place]), PAF (paroxysmal atrial fibrillation) ([place]), Pulmonary hypertension ([place]), and Tobacco abuse.   Surgical History    Past Surgical History:  Procedure Laterality Date  . BIOPSY  03/11/2019   Procedure: BIOPSY;  Surgeon: Lavena Bullion, DO;  Location: [facility];  Service: Gastroenterology;;  . BIOPSY  03/15/2019   Procedure: BIOPSY;  Surgeon: Irving Copas., MD;  Location: [place];  Service: Gastroenterology;;  . CARDIOVERSION N/A 07/30/2018   Procedure: CARDIOVERSION;  Surgeon: Larey Dresser, MD;  Location: [facility] ENDOSCOPY;  Service: Cardiovascular;  Laterality: N/A;  . COLONOSCOPY N/A 03/11/2019   Procedure: COLONOSCOPY;  Surgeon: Lavena Bullion, DO;  Location: [place];  Service: Gastroenterology;  Laterality: N/A;  . ENTEROSCOPY N/A 03/15/2019   Procedure: ENTEROSCOPY;  Surgeon: Rush Landmark Telford Nab., MD;  Location: [place];  Service: Gastroenterology;  Laterality: N/A;  . ESOPHAGOGASTRODUODENOSCOPY Left 03/11/2019   Procedure: ESOPHAGOGASTRODUODENOSCOPY (EGD);  Surgeon: Lavena Bullion, DO;  Location[facility] ENDOSCOPY;  Service: Gastroenterology;  Laterality: Left;  . LEFT AND RIGHT HEART CATHETERIZATION WITH CORONARY ANGIOGRAM N/A 11/06/2014   Procedure: LEFT AND RIGHT HEART CATHETERIZATION WITH CORONARY ANGIOGRAM;  Surgeon: Jettie Booze, MD;  Location: [facility] CATH LAB;  Service:  Cardiovascular;  Laterality: N/A;  . RIGHT/LEFT HEART CATH AND CORONARY ANGIOGRAPHY N/A 12/06/2018   Procedure: RIGHT/LEFT HEART CATH AND CORONARY ANGIOGRAPHY;  Surgeon: Larey Dresser, MD;  Location: [place] CV LAB;  Service: Cardiovascular;  Laterality: N/A;  . SPLENECTOMY    . SUBMUCOSAL TATTOO INJECTION  03/15/2019   Procedure: SUBMUCOSAL TATTOO INJECTION;  Surgeon: Rush Landmark Telford Nab., MD;  Location: [place];  Service: Gastroenterology;;     Social History   reports that he quit smoking about 21 months ago. His smoking use included cigarettes. He has a 34.00 pack-year smoking history. He  has never used smokeless tobacco. He reports that he does not drink alcohol and does not use drugs.   Family History   His family history includes Heart disease in his father and mother; Heart failure in his mother; Hypertension in his mother and sister. There is no history of Heart attack, Stroke, Colon cancer, or Esophageal cancer.   Allergies Allergies  Allergen Reactions  . Bidil [Isosorb Dinitrate-Hydralazine] Shortness Of Breath  . Benazepril Nausea And Vomiting  . Brimonidine     Other reaction(s): Other (See Comments)  . Metformin     Other reaction(s): Cramps (ALLERGY/intolerance), Other (See Comments)  . Morphine Rash     Home Medications  Prior to Admission medications   Medication Sig Start Date End Date Taking? Authorizing Provider  albuterol (PROVENTIL) (2.5 MG/3ML) 0.083% nebulizer solution Take 3 mLs (2.5 mg total) by nebulization every 4 (four) hours as needed for wheezing or shortness of breath. 06/21/20   Tanda Rockers, MD  albuterol (VENTOLIN HFA) 108 (90 Base) MCG/ACT inhaler Inhale 2 puffs into the lungs every 6 (six) hours as needed for wheezing or shortness of breath.    [provider]  amiodarone (PACERONE) 200 MG tablet Take 0.5 tablets (100 mg total) by mouth daily. 08/28/20   Larey Dresser, MD  apixaban (ELIQUIS) 5 MG TABS tablet Take 1 tablet  (5 mg total) by mouth 2 (two) times daily. Patient taking differently: Take 2.5 mg by mouth 2 (two) times daily.  08/29/19   Hosie Poisson, MD  Budeson-Glycopyrrol-Formoterol (BREZTRI AEROSPHERE) 160-9-4.8 MCG/ACT AERO Inhale 2 puffs into the lungs 2 (two) times daily. 06/21/20   Tanda Rockers, MD  Ensure Max Protein (ENSURE MAX PROTEIN) LIQD Take 330 mLs (11 oz total) by mouth daily. 08/29/19   Hosie Poisson, MD  famotidine (PEPCID) 20 MG tablet Take 1 tablet (20 mg total) by mouth 2 (two) times daily. 10/27/19   Larey Dresser, MD  FEROSUL 325 (65 Fe) MG tablet Take 325 mg by mouth 2 (two) times daily. 03/31/19   [provider]  fluticasone (FLONASE) 50 MCG/ACT nasal spray Place 2 sprays into both nostrils daily as needed for allergies.     [provider]  gabapentin (NEURONTIN) 300 MG capsule Take 1 capsule (300 mg total) by mouth 2 (two) times daily. Patient taking differently: Take 300 mg by mouth daily.  06/13/19   Swayze, Ava, DO  hydrocortisone (ANUSOL-HC) 2.5 % rectal cream Place 1 application rectally 2 (two) times daily as needed for hemorrhoids or itching.     [provider]  hydrocortisone 1 % lotion Apply 1 application topically See admin instructions. Apply small amount to back twice daily for itchy rash as needed    [provider]  insulin aspart (NOVOLOG) 100 UNIT/ML injection Inject 4 Units into the skin 3 (three) times daily with meals. 03/31/19   Kayleen Memos, DO  insulin aspart protamine - aspart (NOVOLOG MIX 70/30 FLEXPEN) (70-30) 100 UNIT/ML FlexPen Inject 0.1 mLs (10 Units total) into the skin 2 (two) times daily. 03/31/19   Kayleen Memos, DO  Insulin Syringe 27G X 1/2" 0.5 ML MISC 100 Syringes by Does not apply route 3 (three) times daily. 08/29/19   Hosie Poisson, MD  metolazone (ZAROXOLYN) 2.5 MG tablet Take 1 tablet (2.5 mg total) by mouth once a week. Every Wednesday 08/28/20   Larey Dresser, MD  oxybutynin (DITROPAN) 5 MG  tablet Take 1 tablet (5 mg total) by mouth 4 (  four) times daily. 06/13/19   Swayze, Ava, DO  oxyCODONE-acetaminophen (PERCOCET) 10-325 MG tablet Take 1 tablet by mouth every 6 (six) hours as needed for pain.  05/16/19   [provider]  OXYGEN Inhale 3 L into the lungs continuous.     [provider]  polyethylene glycol (MIRALAX / GLYCOLAX) 17 g packet Take 17 g by mouth daily as needed. 08/29/19   Hosie Poisson, MD  potassium chloride SA (KLOR-CON) 20 MEQ tablet Take 4 tablets (80 mEq total) by mouth daily. With additional 53mq when you take Metolazone. 07/27/20   MLarey Dresser MD  predniSONE (DELTASONE) 10 MG tablet Take 4 each am x 2 days,   2 each am x 2 days,  1 each am x 2 days and stop 09/19/20   WTanda Rockers MD  predniSONE (DELTASONE) 10 MG tablet Take 4 tablets (40 mg total) by mouth daily with breakfast for 2 days, THEN 2 tablets (20 mg total) daily with breakfast for 2 days, THEN 1 tablet (10 mg total) daily with breakfast for 2 days. 09/25/20 10/01/20  WTanda Rockers MD  PRESCRIPTION MEDICATION Cpap    [provider]  rosuvastatin (CRESTOR) 20 MG tablet Take 20 mg by mouth at bedtime. 03/31/19   [provider]  senna-docusate (SENOKOT-S) 8.6-50 MG tablet Take 1 tablet by mouth at bedtime as needed for mild constipation. 08/29/19   AHosie Poisson MD  sodium chloride (OCEAN) 0.65 % SOLN nasal spray Place 2 sprays into both nostrils as needed for congestion.     [provider]  torsemide (DEMADEX) 20 MG tablet Take 4 tablets (80 mg total) by mouth 2 (two) times daily. 11/18/19   MLarey Dresser MD     Critical care time: 370    SRichardson LandryMinor ACNP Acute Care Nurse Practitioner LLairdPlease consult Amion 09/26/2020, 9:23 AM

## 2020-09-26 NOTE — Progress Notes (Signed)
°   09/26/20 0744  BiPAP/CPAP/SIPAP  $ Non-Invasive Ventilator  Non-Invasive Vent Subsequent  BiPAP/CPAP/SIPAP Pt Type Adult  Mask Type Full face mask  Mask Size Large  Set Rate 20 breaths/min (Increased to 20 per ABG results, PC02 66.3)  Respiratory Rate 31 breaths/min  IPAP 14 cmH20  EPAP 6 cmH2O  Oxygen Percent 50 %  Minute Ventilation 13.1  Leak 0  Peak Inspiratory Pressure (PIP) 15  Tidal Volume (Vt) 429  BiPAP/CPAP/SIPAP BiPAP  Patient Home Equipment No  Auto Titrate No  Press High Alarm 35 cmH2O  Press Low Alarm 5 cmH2O  Nasal massage performed No (comment)  BiPAP/CPAP /SiPAP Vitals  Pulse Rate 69  Resp (!) 31  SpO2 96 %  Bilateral Breath Sounds Diminished  MEWS Score/Color  MEWS Score 2  MEWS Score Color Yellow

## 2020-09-26 NOTE — Progress Notes (Signed)
@  1145, RT approached Mr. Nicholas Caldwell room, 2C13, he had the bipap off, talking appropriately, pt won't put the bipap on again, he wanted a break from the machine, RN notified, RT placed pt on 7 L [place], SP02 92%, will continue to monitor, pt is refusing the bipap at this time, will in chat with Richardson Landry Minor, NP, CCM.

## 2020-09-26 NOTE — Progress Notes (Signed)
   09/26/20 0820  BiPAP/CPAP/SIPAP  BiPAP/CPAP/SIPAP Pt Type Adult  Set Rate 20 breaths/min  IPAP 14 cmH20  EPAP 6 cmH2O  Oxygen Percent 40 % (Decreased to 40%)  Tidal Volume (Vt) 926  BiPAP/CPAP/SIPAP BiPAP  BiPAP/CPAP /SiPAP Vitals  SpO2 95 %

## 2020-09-27 DIAGNOSIS — J9601 Acute respiratory failure with hypoxia: Secondary | ICD-10-CM | POA: Diagnosis not present

## 2020-09-27 DIAGNOSIS — I5043 Acute on chronic combined systolic (congestive) and diastolic (congestive) heart failure: Secondary | ICD-10-CM | POA: Diagnosis not present

## 2020-09-27 DIAGNOSIS — G9340 Encephalopathy, unspecified: Secondary | ICD-10-CM | POA: Diagnosis not present

## 2020-09-27 DIAGNOSIS — I5021 Acute systolic (congestive) heart failure: Secondary | ICD-10-CM | POA: Diagnosis not present

## 2020-09-27 LAB — BLOOD GAS, ARTERIAL
Acid-Base Excess: 1.3 mmol/L (ref 0.0–2.0)
Bicarbonate: 27.6 mmol/L (ref 20.0–28.0)
Drawn by: 59156
FIO2: 21
O2 Saturation: 99.1 %
Patient temperature: 36.3
pCO2 arterial: 60.6 mmHg — ABNORMAL HIGH (ref 32.0–48.0)
pH, Arterial: 7.275 — ABNORMAL LOW (ref 7.350–7.450)
pO2, Arterial: 213 mmHg — ABNORMAL HIGH (ref 83.0–108.0)

## 2020-09-27 LAB — CBC WITH DIFFERENTIAL/PLATELET
Abs Immature Granulocytes: 0.02 10*3/uL (ref 0.00–0.07)
Basophils Absolute: 0 10*3/uL (ref 0.0–0.1)
Basophils Relative: 0 %
Eosinophils Absolute: 0.2 10*3/uL (ref 0.0–0.5)
Eosinophils Relative: 2 %
HCT: 35.3 % — ABNORMAL LOW (ref 39.0–52.0)
Hemoglobin: 10.3 g/dL — ABNORMAL LOW (ref 13.0–17.0)
Immature Granulocytes: 0 %
Lymphocytes Relative: 9 %
Lymphs Abs: 0.8 10*3/uL (ref 0.7–4.0)
MCH: 25.9 pg — ABNORMAL LOW (ref 26.0–34.0)
MCHC: 29.2 g/dL — ABNORMAL LOW (ref 30.0–36.0)
MCV: 88.7 fL (ref 80.0–100.0)
Monocytes Absolute: 2.4 10*3/uL — ABNORMAL HIGH (ref 0.1–1.0)
Monocytes Relative: 30 %
Neutro Abs: 4.7 10*3/uL (ref 1.7–7.7)
Neutrophils Relative %: 59 %
Platelets: 319 10*3/uL (ref 150–400)
RBC: 3.98 MIL/uL — ABNORMAL LOW (ref 4.22–5.81)
RDW: 19.9 % — ABNORMAL HIGH (ref 11.5–15.5)
WBC: 8.1 10*3/uL (ref 4.0–10.5)
nRBC: 0.4 % — ABNORMAL HIGH (ref 0.0–0.2)

## 2020-09-27 LAB — BASIC METABOLIC PANEL
Anion gap: 14 (ref 5–15)
BUN: 59 mg/dL — ABNORMAL HIGH (ref 8–23)
CO2: 28 mmol/L (ref 22–32)
Calcium: 8.3 mg/dL — ABNORMAL LOW (ref 8.9–10.3)
Chloride: 102 mmol/L (ref 98–111)
Creatinine, Ser: 3.94 mg/dL — ABNORMAL HIGH (ref 0.61–1.24)
GFR, Estimated: 15 mL/min — ABNORMAL LOW (ref >60)
Glucose, Bld: 124 mg/dL — ABNORMAL HIGH (ref 70–99)
Potassium: 3.6 mmol/L (ref 3.5–5.1)
Sodium: 144 mmol/L (ref 135–145)

## 2020-09-27 LAB — GLUCOSE, CAPILLARY
Glucose-Capillary: 107 mg/dL — ABNORMAL HIGH (ref 70–99)
Glucose-Capillary: 112 mg/dL — ABNORMAL HIGH (ref 70–99)
Glucose-Capillary: 119 mg/dL — ABNORMAL HIGH (ref 70–99)
Glucose-Capillary: 127 mg/dL — ABNORMAL HIGH (ref 70–99)
Glucose-Capillary: 132 mg/dL — ABNORMAL HIGH (ref 70–99)
Glucose-Capillary: 172 mg/dL — ABNORMAL HIGH (ref 70–99)

## 2020-09-27 LAB — PROCALCITONIN: Procalcitonin: 0.33 ng/mL

## 2020-09-27 MED ORDER — HYDROCODONE-ACETAMINOPHEN 5-325 MG PO TABS
1.0000 | ORAL_TABLET | Freq: Four times a day (QID) | ORAL | Status: DC | PRN
  Administered 2020-09-27 – 2020-10-09 (×22): 1 via ORAL
  Filled 2020-09-27 (×23): qty 1

## 2020-09-27 MED ORDER — HYDROCODONE-ACETAMINOPHEN 7.5-325 MG PO TABS: 1.0000 | ORAL_TABLET | Freq: Four times a day (QID) | ORAL | Status: DC | PRN

## 2020-09-27 NOTE — Progress Notes (Signed)
Patient asked to be placed back on BiPAP. Stated he wanted to nap. Resting comfortably. RT to monitor.

## 2020-09-27 NOTE — Plan of Care (Signed)
  Problem: Education: Goal: Knowledge of General Education information will improve Description: Including pain rating scale, medication(s)/side effects and non-pharmacologic comfort measures Outcome: Progressing   Problem: Clinical Measurements: Goal: Ability to maintain clinical measurements within normal limits will improve Outcome: Progressing Goal: Will remain free from infection Outcome: Progressing Goal: Diagnostic test results will improve Outcome: Progressing Goal: Cardiovascular complication will be avoided Outcome: Progressing

## 2020-09-27 NOTE — Progress Notes (Signed)
Asked to be placed on bipap done, continue to monitor.

## 2020-09-27 NOTE — Progress Notes (Signed)
PT Cancellation Note  Patient Details Name: Nicholas Caldwell MRN: [id number] DOB: [birth year]   Cancelled Treatment:    Reason Eval/Treat Not Completed: Medical issues which prohibited therapy. Pt transitioning from NRB to Bi-PAP. PT will hold until respiratory status is more stable.   Zenaida Niece 09/27/2020, 11:51 AM

## 2020-09-27 NOTE — Progress Notes (Signed)
Placed on non-rebreathing mask to eat. Refused to be on 5L [place] while eating.

## 2020-09-27 NOTE — Progress Notes (Signed)
Requested to be placed on non-rebreathing mask, tolerated well.

## 2020-09-27 NOTE — Progress Notes (Signed)
PROGRESS NOTE  Nicholas Caldwell EHU:[id number] DOB: Sep 08, 1946 DOA: 09/25/2020 PCP: [name], MD  Brief History   Chief Complaint: brought in for shortness of breath  HPI: Nicholas Caldwell is a 74 y.o. male with medical history of NISCM, systolic CHF and mod RV failure, DM2, CKD 4, PAF s/p DCCV, COPD on home O2, OHS/ OSA on 3 L O2 and CPAP & CAD.  The patient is currently sleepy and unable to give me a detailed history. He knows he is at [facility] and know his age but will not tell me why he is in the hospital and falls back to sleep. Per ED notes, he has been more short of breath for about 1 wk and has more fluid on his legs.   ED Course: Pulse ox was 86% on 4 L. Stayed in 80s on 6 L. Subsequently placed on a non re-breather- pulse ox has been 100%. Given 40 mg IV Lasix. CXR reveals pulmonary edema.   ABG this am again demonstrated continued respiratory acidosis and hypercapnea while on BIPAP.  Consultants   Pulmonology  Procedures   None  Antibiotics   Anti-infectives (From admission, onward)   None     Subjective  The patient is lying in bed on Venti mask. He is without new complaints. He has been intermittently refusing to wear his O2.  Objective   Vitals:  Vitals:   09/27/20 0732 09/27/20 0810  BP: (!) 116/53 (!) 121/54  Pulse:    Resp:    Temp: 97.6 F (36.4 C)   SpO2:      Exam:  Constitutional:   The patient is awake, alert, and oriented x 3. No acute distress. Respiratory:   No increased work of breathing.  No wheezes or rhonchi  Positive for rales at bases bilaterally  Diminished breath sounds bilaterally  No tactile fremitus Cardiovascular:   Regular rate and rhythm  No murmurs, ectopy, or gallups.  No lateral PMI. No thrills. Abdomen:   Abdomen is soft, non-tender, non-distended  No hernias, masses, or organomegaly  Normoactive bowel sounds.  Musculoskeletal:   No cyanosis, clubbing, or edema Skin:   No rashes, lesions,  ulcers  palpation of skin: no induration or nodules Neurologic:   CN 2-12 intact  Sensation all 4 extremities intact Psychiatric:   Mental status o Mood, affect appropriate o Orientation to person, place, time   judgment and insight appear intact  I have personally reviewed the following:   Today's Data   Vitals, ABG, CBC, BMP  Imaging   CXR  Scheduled Meds:  amiodarone  100 mg Oral Daily   apixaban  5 mg Oral BID   insulin aspart  0-9 Units Subcutaneous Q4H   sodium chloride flush  3 mL Intravenous Q12H   Continuous Infusions:  sodium chloride     furosemide 120 mg (09/27/20 0517)    Principal Problem:   Acute respiratory failure (HCC) Active Problems:   Essential hypertension   Type 2 diabetes mellitus with neuropathy   Morbid obesity (HCC)   PAF (paroxysmal atrial fibrillation) (HCC)   COPD (chronic obstructive pulmonary disease) (HCC)   Acute on chronic combined systolic and diastolic CHF (congestive heart failure) (HCC)   OSA (obstructive sleep apnea)   Acute encephalopathy   Acute respiratory failure with hypoxia and hypercapnia (HCC)   Acute systolic CHF (congestive heart failure) ([place])   LOS: 2 days   A & P  Acute on chronic hypoxic and hypercapneic respiratory failure: The patient was admitted with  respiratory acidosis and hypoxia that required BIPAP. Repeat ABG with the patient on BIPAP demonstrated somewhat improved, but continued respiratory acidosis and hypercarbia. Pulmonology was consulted and adjustments were made to the BIPAP settings. Will continue to monitor. Per pulmonology the patient need only wear BIPAP at night. He has been intermittently refusing to wear his O2.  Acute on chronic combined systolic and diastolic CHF (congestive heart   failure) with severe RV systolic failure and dilatation: The patient is on 3-4 L of O2 at home. He presented with pulmonary edema, pedal edema and BNP elevation of 3275. He is receiving diuresis with  lasix 120 mg tid. As last echocardiogram was performed in 08/2019, it was repeated. Today's study report is pending. I's and O's are being following closely. He is currently negative 2.1 liters in terms of fluid balance. Continue to monitor.  Acute encephalopathy: Due to hypercarbia and possibly Neurontin which has been held. Improved mental status, although still with hypercarbia.   CKD 4: Creatinine/GFR are worsening slightly every day. Today's creatinine is 3.94. Monitor creatinine, electrolytes, and monitor volume status.   Mildly elevated Troponin: Likely due to cardiac strain in setting of severe renal disease   Type 2 diabetes mellitus with neuropathy: HBA1c of 09/25/2020 was 6.7. Glucoses have run from 100-172 in the last 24 hours.  Morbid obesity: This complicates all cares. Body mass index is 35.26 kg/m.  PAF (paroxysmal atrial fibrillation): Sinus rhythm per EKG 09/25/2020. Monitor on telemetry. Amiodarone and Apixaban to be continued. Rate is well controlled.   Chronic pain: Narcotics are held as them may depress respiration and worsen respiratory acidosis and hypercarbia. He is receiving IV acetaminophen. I will start the patient on a low dose of hydrocodone due to his severe hip pain. Will avoid restarting his 10/325 mg percocets as he takes at time out of concern for respiratory depression and worsening acidosis.   Normocytic anemia: Hb has ranged from 8.8-11 in the past year. Hemoglobin is stable at 10.3.  H/o PEA arrest in 10/20: Noted. Monitor on telemetry.  I have seen and examined this patient myself. I have spent 32 minutes in his evaluation and care.  DVT prophylaxis: Eliquis  Code Status: Full code  Family Communication: None is available. Disposition Plan:  Status is: Inpatient  Remains inpatient appropriate because:Inpatient level of care appropriate due to severity of illness   Dispo: The patient is from: Home              Anticipated d/c is to:  TBD              Anticipated d/c date is: 3 days              Patient currently is not medically stable to d/c.  Teja Costen, DO [facility] Direct contact: see [email or web address]  7PM-7AM contact night coverage as above 09/27/2020, 1:25 PM  LOS: 1 day

## 2020-09-28 DIAGNOSIS — I5021 Acute systolic (congestive) heart failure: Secondary | ICD-10-CM | POA: Diagnosis not present

## 2020-09-28 DIAGNOSIS — I5043 Acute on chronic combined systolic (congestive) and diastolic (congestive) heart failure: Secondary | ICD-10-CM | POA: Diagnosis not present

## 2020-09-28 DIAGNOSIS — G9340 Encephalopathy, unspecified: Secondary | ICD-10-CM | POA: Diagnosis not present

## 2020-09-28 DIAGNOSIS — J9601 Acute respiratory failure with hypoxia: Secondary | ICD-10-CM | POA: Diagnosis not present

## 2020-09-28 LAB — CBC WITH DIFFERENTIAL/PLATELET
Abs Immature Granulocytes: 0.03 10*3/uL (ref 0.00–0.07)
Basophils Absolute: 0 10*3/uL (ref 0.0–0.1)
Basophils Relative: 1 %
Eosinophils Absolute: 0.1 10*3/uL (ref 0.0–0.5)
Eosinophils Relative: 2 %
HCT: 36 % — ABNORMAL LOW (ref 39.0–52.0)
Hemoglobin: 10.3 g/dL — ABNORMAL LOW (ref 13.0–17.0)
Immature Granulocytes: 1 %
Lymphocytes Relative: 8 %
Lymphs Abs: 0.5 10*3/uL — ABNORMAL LOW (ref 0.7–4.0)
MCH: 25.6 pg — ABNORMAL LOW (ref 26.0–34.0)
MCHC: 28.6 g/dL — ABNORMAL LOW (ref 30.0–36.0)
MCV: 89.6 fL (ref 80.0–100.0)
Monocytes Absolute: 1.2 10*3/uL — ABNORMAL HIGH (ref 0.1–1.0)
Monocytes Relative: 19 %
Neutro Abs: 4.6 10*3/uL (ref 1.7–7.7)
Neutrophils Relative %: 69 %
Platelets: 312 10*3/uL (ref 150–400)
RBC: 4.02 MIL/uL — ABNORMAL LOW (ref 4.22–5.81)
RDW: 20.2 % — ABNORMAL HIGH (ref 11.5–15.5)
WBC: 6.5 10*3/uL (ref 4.0–10.5)
nRBC: 0.5 % — ABNORMAL HIGH (ref 0.0–0.2)

## 2020-09-28 LAB — GLUCOSE, CAPILLARY
Glucose-Capillary: 117 mg/dL — ABNORMAL HIGH (ref 70–99)
Glucose-Capillary: 117 mg/dL — ABNORMAL HIGH (ref 70–99)
Glucose-Capillary: 150 mg/dL — ABNORMAL HIGH (ref 70–99)
Glucose-Capillary: 150 mg/dL — ABNORMAL HIGH (ref 70–99)
Glucose-Capillary: 169 mg/dL — ABNORMAL HIGH (ref 70–99)
Glucose-Capillary: 241 mg/dL — ABNORMAL HIGH (ref 70–99)

## 2020-09-28 LAB — BASIC METABOLIC PANEL
Anion gap: 14 (ref 5–15)
BUN: 57 mg/dL — ABNORMAL HIGH (ref 8–23)
CO2: 27 mmol/L (ref 22–32)
Calcium: 8.2 mg/dL — ABNORMAL LOW (ref 8.9–10.3)
Chloride: 100 mmol/L (ref 98–111)
Creatinine, Ser: 4.06 mg/dL — ABNORMAL HIGH (ref 0.61–1.24)
GFR, Estimated: 15 mL/min — ABNORMAL LOW (ref >60)
Glucose, Bld: 120 mg/dL — ABNORMAL HIGH (ref 70–99)
Potassium: 3.2 mmol/L — ABNORMAL LOW (ref 3.5–5.1)
Sodium: 141 mmol/L (ref 135–145)

## 2020-09-28 LAB — PROCALCITONIN: Procalcitonin: 0.33 ng/mL

## 2020-09-28 MED ORDER — POTASSIUM CHLORIDE CRYS ER 20 MEQ PO TBCR
40.0000 meq | EXTENDED_RELEASE_TABLET | ORAL | Status: AC
  Administered 2020-09-28: 40 meq via ORAL
  Filled 2020-09-28: qty 2

## 2020-09-28 NOTE — Progress Notes (Signed)
PROGRESS NOTE  Nicholas Caldwell GUY:[id number] DOB: 1946/09/04 DOA: 09/25/2020 PCP: [name], MD  Brief History   Chief Complaint: brought in for shortness of breath  HPI: Nicholas Caldwell is a 74 y.o. male with medical history of NISCM, systolic CHF and mod RV failure, DM2, CKD 4, PAF s/p DCCV, COPD on home O2, OHS/ OSA on 3 L O2 and CPAP & CAD.  The patient is currently sleepy and unable to give me a detailed history. He knows he is at [facility] and know his age but will not tell me why he is in the hospital and falls back to sleep. Per ED notes, he has been more short of breath for about 1 wk and has more fluid on his legs.   Pulse ox was 86% on 4 L. Stayed in 80s on 6 L. Subsequently placed on a non re-breather- pulse ox has been 100%. Given 40 mg IV Lasix. CXR reveals pulmonary edema.   Pt is intermittently non-compliant with BIPAP and O2. Consultants   Pulmonology  Procedures   None  Antibiotics   Anti-infectives (From admission, onward)   None     Subjective  The patient is lying in bed. No new complaints.  Objective   Vitals:  Vitals:   09/28/20 0710 09/28/20 1236  BP: 120/75 (!) 117/57  Pulse: 75 87  Resp: 20 17  Temp: 98.7 F (37.1 C) 97.8 F (36.6 C)  SpO2: 94% 95%    Exam:  Constitutional:   The patient is awake, alert, and oriented x 3. No acute distress. Respiratory:   No increased work of breathing.  No wheezes, rales, or rhonchi  Diminished breath sounds bilaterally  No tactile fremitus Cardiovascular:   Regular rate and rhythm  No murmurs, ectopy, or gallups.  No lateral PMI. No thrills. Abdomen:   Abdomen is soft, non-tender, non-distended  No hernias, masses, or organomegaly  Normoactive bowel sounds.  Musculoskeletal:   No cyanosis, clubbing, or edema Skin:   No rashes, lesions, ulcers  palpation of skin: no induration or nodules Neurologic:   CN 2-12 intact  Sensation all 4 extremities intact Psychiatric:    Mental status o Mood, affect appropriate o Orientation to person, place, time   judgment and insight appear intact  I have personally reviewed the following:   Today's Data   Vitals, CBC, BMP  Imaging   CXR  Scheduled Meds:  amiodarone  100 mg Oral Daily   apixaban  5 mg Oral BID   insulin aspart  0-9 Units Subcutaneous Q4H   potassium chloride  40 mEq Oral Q4H   sodium chloride flush  3 mL Intravenous Q12H   Continuous Infusions:  sodium chloride     furosemide 120 mg (09/28/20 1423)    Principal Problem:   Acute respiratory failure (HCC) Active Problems:   Essential hypertension   Type 2 diabetes mellitus with neuropathy   Morbid obesity (HCC)   PAF (paroxysmal atrial fibrillation) (HCC)   COPD (chronic obstructive pulmonary disease) (HCC)   Acute on chronic combined systolic and diastolic CHF (congestive heart failure) (HCC)   OSA (obstructive sleep apnea)   Acute encephalopathy   Acute respiratory failure with hypoxia and hypercapnia (HCC)   Acute systolic CHF (congestive heart failure) (HCC)   LOS: 3 days   A & P  Acute on chronic hypoxic and hypercapneic respiratory failure: The patient was admitted with respiratory acidosis and hypoxia that required BIPAP. Repeat ABG with the patient on BIPAP demonstrated somewhat improved, but  continued respiratory acidosis and hypercarbia. Pulmonology was consulted and adjustments were made to the BIPAP settings. Will continue to monitor. Per pulmonology the patient need only wear BIPAP at night. He has been intermittently refusing to wear his O2 or BIPAP.  Acute on chronic combined systolic and diastolic CHF (congestive heart   failure) with severe RV systolic failure and dilatation: The patient is on 3-4 L of O2 at home. He presented with pulmonary edema, pedal edema and BNP elevation of 3275. He is receiving diuresis with lasix 120 mg tid. As last echocardiogram was performed in 08/2019, it was repeated. Today's  study report is pending. I's and O's are being following closely. He is currently negative 4.394 liters in terms of fluid balance. Continue to monitor.  Acute encephalopathy: Resolved. Due to hypercarbia and possibly Neurontin which has been held. Improved mental status, although still with hypercarbia.   CKD 4: Creatinine/GFR are worsening slightly every day. Today's creatinine is 4.06. Monitor creatinine, electrolytes, and monitor volume status.   Mildly elevated Troponin: Likely due to cardiac strain in setting of severe renal disease   Type 2 diabetes mellitus with neuropathy: HBA1c of 09/25/2020 was 6.7. Glucoses have run from 117-150 in the last 24 hours.  Morbid obesity: This complicates all cares. Body mass index is 35.26 kg/m.  PAF (paroxysmal atrial fibrillation): Sinus rhythm per EKG 09/25/2020. Monitor on telemetry. Amiodarone and Apixaban to be continued. Rate is well controlled.   Chronic pain: Narcotics are held as them may depress respiration and worsen respiratory acidosis and hypercarbia. He is receiving IV acetaminophen. I will start the patient on a low dose of hydrocodone due to his severe hip pain. Will avoid restarting his 10/325 mg percocets as he takes at time out of concern for respiratory depression and worsening acidosis.   Normocytic anemia: Hb has ranged from 8.8-11 in the past year. Hemoglobin is stable at 10.3.  H/o PEA arrest in 10/20: Noted. Monitor on telemetry.  I have seen and examined this patient myself. I have spent 30 minutes in his evaluation and care.  DVT prophylaxis: Eliquis  Code Status: Full code  Family Communication: None is available. Disposition Plan:  Status is: Inpatient  Remains inpatient appropriate because:Inpatient level of care appropriate due to severity of illness   Dispo: The patient is from: Home              Anticipated d/c is to: SNF              Anticipated d/c date is: 3 days              Patient currently  is not medically stable to d/c.  Nikie Cid, DO [facility] Direct contact: see [email or web address]  7PM-7AM contact night coverage as above 09/29/2020, 3:26 PM  LOS: 1 day

## 2020-09-28 NOTE — Plan of Care (Signed)

## 2020-09-28 NOTE — Evaluation (Signed)
Occupational Therapy Evaluation Patient Details Name: Nicholas Caldwell MRN: [id number] DOB: [birth year] Today's Date: 09/28/2020    History of Present Illness 74 y.o. male with medical history of NISCM, systolic CHF and mod RV failure, DM2, CKD 4, PAF s/p DCCV, COPD on home O2, OHS/ OSA on 3 L O2 and CPAP & CAD.  Per ED note: "Pulse ox was 86% on 4 L. Stayed in 80s on 6 L. Subsequently placed on a non re-breather- pulse ox has been 100%. Given 40 mg IV Lasix. CXR reveals pulmonary edema."   Clinical Impression   Patient admitted with the above diagnosis.  Per nursing, patient has had several admissions to this unit due to medical non-compliance.  He states he is fairly sedentary at home, and his spouse provides assist as needed.  He does not assist with home management or meals, he needs mod to max A for ADL completion, and the spouse assists with meds.  When he transfers, he does use a RW.  OT will follow in the acute setting, unclear what the final discharge destination will be, but SNF has been recommended at this point.      Follow Up Recommendations  SNF    Equipment Recommendations  Other (comment) (Has needed DME)    Recommendations for Other Services       Precautions / Restrictions Precautions Precautions: Fall Precaution Comments: O2 sats Restrictions Weight Bearing Restrictions: No      Mobility Bed Mobility Overal bed mobility: Needs Assistance Bed Mobility: Rolling Rolling: Max assist              Transfers                 General transfer comment: Nursing assisted patient to sit at edge of bed.  Patient was unable to maintain EOB sitting and needed Max A to near Dep for sit to supine - per nurse.  Patient declined another EOB attempt.    Balance                                           ADL either performed or assessed with clinical judgement   ADL Overall ADL's : Needs assistance/impaired     Grooming: Wash/dry hands;Set up;Bed  level   Upper Body Bathing: Moderate assistance;Bed level   Lower Body Bathing: Total assistance;Bed level       Lower Body Dressing: Total assistance;Bed level                       Vision Patient Visual Report: No change from baseline       Perception     Praxis      Pertinent Vitals/Pain Pain Assessment: No/denies pain     Hand Dominance Right   Extremity/Trunk Assessment Upper Extremity Assessment Upper Extremity Assessment: Generalized weakness   Lower Extremity Assessment Lower Extremity Assessment: Defer to PT evaluation   Cervical / Trunk Assessment Cervical / Trunk Assessment: Normal   Communication Communication Communication: No difficulties   Cognition Arousal/Alertness: Awake/alert Behavior During Therapy: WFL for tasks assessed/performed Overall Cognitive Status: Within Functional Limits for tasks assessed                                     General Comments   desaturates quickly to 84%, but  will rebound to 92%.  Patient is on a NRB today.      Exercises General Exercises - Upper Extremity Shoulder Flexion: AROM;Both;10 reps;Supine Shoulder Extension: AROM;Right;10 reps;Supine Elbow Flexion: AROM;Both;10 reps;Supine Elbow Extension: AROM;Both;10 reps;Supine Digit Composite Flexion: AROM;Both;20 reps;Supine Composite Extension: AROM;Both;Supine Other Exercises Other Exercises: Had patient roll from side to side with use of SR's and Max A   Shoulder Instructions      Home Living Family/patient expects to be discharged to:: Private residence Living Arrangements: Spouse/significant other Available Help at Discharge: Family Type of Home: House Home Access: Stairs to enter Technical brewer of Steps: 1 Entrance Stairs-Rails: None Home Layout: One level     Bathroom Shower/Tub: Occupational psychologist: Standard Bathroom Accessibility: Yes How Accessible: Accessible via walker Home Equipment: [place]  - 2 wheels;Wheelchair - manual;Bedside commode;Grab bars - tub/shower;Shower seat   Additional Comments: 3 to 4 L at home.  Does not slepp in the bed, uses a recliner.  Fairly sedentary.      Prior Functioning/Environment Level of Independence: Needs assistance  Gait / Transfers Assistance Needed: Patient is fairly sedentary.  Mobility when he has to. ADL's / Homemaking Assistance Needed: States spouse assists with > than 50% of bathing and dressing.  He does not participate with meal prep or home management.  Spouse manages meds.  Patient states he can still drive. Communication / Swallowing Assistance Needed: [facility]          OT Problem List: Decreased strength;Decreased activity tolerance;Impaired balance (sitting and/or standing)      OT Treatment/Interventions: Self-care/ADL training;Therapeutic exercise;Energy conservation;DME and/or AE instruction;Balance training;Therapeutic activities    OT Goals(Current goals can be found in the care plan section) Acute Rehab OT Goals Patient Stated Goal: Wants to get stronger OT Goal Formulation: With patient Time For Goal Achievement: 10/12/20 Potential to Achieve Goals: Fair ADL Goals Pt Will Perform Grooming: with min guard assist;sitting Pt Will Perform Upper Body Bathing: with min guard assist;sitting Pt Will Perform Upper Body Dressing: with min guard assist;sitting Pt Will Transfer to Toilet: with min assist;bedside commode  OT Frequency: Min 2X/week   Barriers to D/C:    Medical Status       Co-evaluation              AM-PAC OT "6 Clicks" Daily Activity     Outcome Measure Help from another person eating meals?: A Little Help from another person taking care of personal grooming?: A Little Help from another person toileting, which includes using toliet, bedpan, or urinal?: A Lot Help from another person bathing (including washing, rinsing, drying)?: A Lot Help from another person to put on and taking off regular upper  body clothing?: A Lot Help from another person to put on and taking off regular lower body clothing?: Total 6 Click Score: 13   End of Session Equipment Utilized During Treatment: Oxygen Nurse Communication: Mobility status  Activity Tolerance: Patient limited by fatigue Patient left: in bed;with call bell/phone within reach  OT Visit Diagnosis: Muscle weakness (generalized) (M62.81);Other abnormalities of gait and mobility (R26.89)                Time: 1694-5038 OT Time Calculation (min): 22 min Charges:  OT General Charges $OT Visit: 1 Visit OT Evaluation $OT Eval Moderate Complexity: 1 Mod  09/28/2020  Rich, OTR/L  Acute Rehabilitation Services  Office:  [place] 09/28/2020, 10:28 AM

## 2020-09-28 NOTE — Evaluation (Signed)
Physical Therapy Evaluation Patient Details Name: Nicholas Caldwell MRN: [id number] DOB: 09-30-1946 Today's Date: 09/28/2020   History of Present Illness  Pt is a 74 y.o. male with medical history of NISCM, systolic CHF and mod RV failure, DM2, CKD 4, PAF s/p DCCV, COPD on home O2, OHS/ OSA on 3 L O2 and CPAP & CAD.  Pt admitted with acute on chronic hypoxic and hypercapneic resp failure requiring bipap.  Clinical Impression  Pt admitted with above diagnosis. Pt on non-rebreather mask and with low tolerance for activity today (somewhat self limiting).  He did participate with bed exercises well but did not want to attempt further transfers to EOB.  Pt requiring assist for ADLs at baseline and limited household ambulator with RW.  Pt will need +2 assist to advance mobility OOB.   Pt currently with functional limitations due to the deficits listed below (see PT Problem List). Pt will benefit from skilled PT to increase their independence and safety with mobility to allow discharge to the venue listed below.       Follow Up Recommendations SNF    Equipment Recommendations  Other (comment) (hoyer if home but needs further assessment next venue)    Recommendations for Other Services       Precautions / Restrictions Precautions Precautions: Fall Precaution Comments: O2 sats Restrictions Weight Bearing Restrictions: No      Mobility  Bed Mobility Overal bed mobility: Needs Assistance Bed Mobility: Rolling Rolling: Max assist              Transfers                 General transfer comment: Pt assisted to EOB earlier with nursing.  Max A per nursing and pt with poor tolerance and not able to maintain. Pt declined another attempt to EOB.  Did have pt perform multiple long sits in bed (lifting trunk up from [address] [facility] at 45 degrees)  Ambulation/Gait                Stairs            Wheelchair Mobility    Modified Rankin (Stroke Patients Only)       Balance Overall  balance assessment: Needs assistance Sitting-balance support: Bilateral upper extremity supported Sitting balance-Leahy Scale: Zero Sitting balance - Comments: Long sits in bed with bil UE support and mod A                                     Pertinent Vitals/Pain Pain Assessment: 0-10 Pain Score: 8  Pain Location: stomach Pain Descriptors / Indicators: Discomfort Pain Intervention(s): Limited activity within patient's tolerance;Monitored during session;Repositioned    Home Living Family/patient expects to be discharged to:: Private residence Living Arrangements: Spouse/significant other Available Help at Discharge: Family;Available 24 hours/day Type of Home: House Home Access: Stairs to enter Entrance Stairs-Rails: None Entrance Stairs-Number of Steps: 1 Home Layout: One level Home Equipment: Walker - 2 wheels;Wheelchair - manual;Bedside commode;Grab bars - tub/shower;Shower seat Additional Comments: On 3-4 L O2 at home.  Pt sleeps in recliner    Prior Function Level of Independence: Needs assistance   Gait / Transfers Assistance Needed: Patient is fairly sedentary.  Can ambulate household distances with RW  ADL's / Homemaking Assistance Needed: States spouse assists with > than 50% of bathing and dressing.  He does not participate with meal prep or home management.  Spouse manages  meds.  Patient states he can still drive.        Hand Dominance   Dominant Hand: Right    Extremity/Trunk Assessment   Upper Extremity Assessment Upper Extremity Assessment: Defer to OT evaluation;Generalized weakness    Lower Extremity Assessment Lower Extremity Assessment: LLE deficits/detail;RLE deficits/detail RLE Deficits / Details: ROM WFL; MMT 2/5 hip, 3/5 knee, 4/5 ankle LLE Deficits / Details: ROM WFL; MMT 2/5 hip, 3/5 knee, 4/5 ankle    Cervical / Trunk Assessment Cervical / Trunk Assessment: Normal  Communication   Communication: No difficulties  Cognition  Arousal/Alertness: Awake/alert Behavior During Therapy: WFL for tasks assessed/performed Overall Cognitive Status: Within Functional Limits for tasks assessed                                        General Comments General comments (skin integrity, edema, etc.): Pt on non-rebreather mask at 15 L.  O2 sats 94% or greater during session    Exercises General Exercises - Upper Extremity General Exercises - Lower Extremity Ankle Circles/Pumps: AROM;5 reps;Both;Supine Quad Sets: AROM;Both;5 reps;Supine Short Arc Quad: AAROM;Both;5 reps;Supine Heel Slides: AAROM;Both;5 reps;Supine Hip ABduction/ADduction: AAROM;Both;5 reps;Supine Other Exercises Other Exercises: Moving to long sitting position from [facility] at 45 degrees with mod A and use of handrails on bed ; 3 reps x 2 sets   Assessment/Plan    PT Assessment Patient needs continued PT services  PT Problem List Decreased strength;Decreased mobility;Decreased safety awareness;Decreased range of motion;Decreased knowledge of precautions;Decreased activity tolerance;Cardiopulmonary status limiting activity;Decreased balance;Decreased knowledge of use of DME       PT Treatment Interventions DME instruction;Therapeutic activities;Gait training;Patient/family education;Therapeutic exercise;Balance training;Functional mobility training    PT Goals (Current goals can be found in the Care Plan section)  Acute Rehab PT Goals Patient Stated Goal: Wants to get stronger PT Goal Formulation: With patient/family Time For Goal Achievement: 10/12/20 Potential to Achieve Goals: Fair    Frequency Min 2X/week   Barriers to discharge        Co-evaluation               AM-PAC PT "6 Clicks" Mobility  Outcome Measure Help needed turning from your back to your side while in a flat bed without using bedrails?: A Lot Help needed moving from lying on your back to sitting on the side of a flat bed without using bedrails?: Total Help  needed moving to and from a bed to a chair (including a wheelchair)?: Total Help needed standing up from a chair using your arms (e.g., wheelchair or bedside chair)?: Total Help needed to walk in hospital room?: Total Help needed climbing 3-5 steps with a railing? : Total 6 Click Score: 7    End of Session Equipment Utilized During Treatment: Oxygen Activity Tolerance: Patient limited by fatigue Patient left: in bed;with call bell/phone within reach;with bed alarm set;with family/visitor present Nurse Communication: Mobility status PT Visit Diagnosis: Other abnormalities of gait and mobility (R26.89);Muscle weakness (generalized) (M62.81)    Time: 1320-1340 PT Time Calculation (min) (ACUTE ONLY): 20 min   Charges:   PT Evaluation $PT Eval Low Complexity: 1 Low          Wassim Kirksey, PT Acute Rehab Services Pager [phone] [facility] 09/28/2020, 1:53 PM

## 2020-09-28 NOTE — Plan of Care (Signed)
  Problem: Clinical Measurements: Goal: Ability to maintain clinical measurements within normal limits will improve Outcome: Progressing   Problem: Clinical Measurements: Goal: Diagnostic test results will improve Outcome: Progressing   Problem: Clinical Measurements: Goal: Respiratory complications will improve Outcome: Progressing   Problem: Nutrition: Goal: Adequate nutrition will be maintained Outcome: Progressing   Problem: Elimination: Goal: Will not experience complications related to urinary retention Outcome: Progressing   Problem: Pain Managment: Goal: General experience of comfort will improve Outcome: Progressing   Problem: Skin Integrity: Goal: Risk for impaired skin integrity will decrease Outcome: Progressing

## 2020-09-29 DIAGNOSIS — I5043 Acute on chronic combined systolic (congestive) and diastolic (congestive) heart failure: Secondary | ICD-10-CM | POA: Diagnosis not present

## 2020-09-29 DIAGNOSIS — J9601 Acute respiratory failure with hypoxia: Secondary | ICD-10-CM | POA: Diagnosis not present

## 2020-09-29 DIAGNOSIS — I5021 Acute systolic (congestive) heart failure: Secondary | ICD-10-CM | POA: Diagnosis not present

## 2020-09-29 DIAGNOSIS — G9340 Encephalopathy, unspecified: Secondary | ICD-10-CM | POA: Diagnosis not present

## 2020-09-29 LAB — BLOOD GAS, ARTERIAL
Acid-Base Excess: 8.7 mmol/L — ABNORMAL HIGH (ref 0.0–2.0)
Bicarbonate: 34.4 mmol/L — ABNORMAL HIGH (ref 20.0–28.0)
Drawn by: 427831
FIO2: 80
O2 Saturation: 92.8 %
Patient temperature: 36.7
pCO2 arterial: 63.9 mmHg — ABNORMAL HIGH (ref 32.0–48.0)
pH, Arterial: 7.349 — ABNORMAL LOW (ref 7.350–7.450)
pO2, Arterial: 69.1 mmHg — ABNORMAL LOW (ref 83.0–108.0)

## 2020-09-29 LAB — BASIC METABOLIC PANEL
Anion gap: 16 — ABNORMAL HIGH (ref 5–15)
Anion gap: 17 — ABNORMAL HIGH (ref 5–15)
Anion gap: 17 — ABNORMAL HIGH (ref 5–15)
BUN: 61 mg/dL — ABNORMAL HIGH (ref 8–23)
BUN: 56 mg/dL — ABNORMAL HIGH (ref 8–23)
BUN: 58 mg/dL — ABNORMAL HIGH (ref 8–23)
CO2: 32 mmol/L (ref 22–32)
CO2: 36 mmol/L — ABNORMAL HIGH (ref 22–32)
CO2: 34 mmol/L — ABNORMAL HIGH (ref 22–32)
Calcium: 8.1 mg/dL — ABNORMAL LOW (ref 8.9–10.3)
Calcium: 8.3 mg/dL — ABNORMAL LOW (ref 8.9–10.3)
Calcium: 8.2 mg/dL — ABNORMAL LOW (ref 8.9–10.3)
Chloride: 95 mmol/L — ABNORMAL LOW (ref 98–111)
Chloride: 93 mmol/L — ABNORMAL LOW (ref 98–111)
Chloride: 94 mmol/L — ABNORMAL LOW (ref 98–111)
Creatinine, Ser: 3.95 mg/dL — ABNORMAL HIGH (ref 0.61–1.24)
Creatinine, Ser: 3.8 mg/dL — ABNORMAL HIGH (ref 0.61–1.24)
Creatinine, Ser: 3.63 mg/dL — ABNORMAL HIGH (ref 0.61–1.24)
GFR, Estimated: 15 mL/min — ABNORMAL LOW (ref >60)
GFR, Estimated: 16 mL/min — ABNORMAL LOW (ref >60)
GFR, Estimated: 17 mL/min — ABNORMAL LOW (ref >60)
Glucose, Bld: 130 mg/dL — ABNORMAL HIGH (ref 70–99)
Glucose, Bld: 141 mg/dL — ABNORMAL HIGH (ref 70–99)
Glucose, Bld: 133 mg/dL — ABNORMAL HIGH (ref 70–99)
Potassium: 2.9 mmol/L — ABNORMAL LOW (ref 3.5–5.1)
Potassium: 2.6 mmol/L — CL (ref 3.5–5.1)
Potassium: 4.2 mmol/L (ref 3.5–5.1)
Sodium: 143 mmol/L (ref 135–145)
Sodium: 146 mmol/L — ABNORMAL HIGH (ref 135–145)
Sodium: 145 mmol/L (ref 135–145)

## 2020-09-29 LAB — GLUCOSE, CAPILLARY
Glucose-Capillary: 133 mg/dL — ABNORMAL HIGH (ref 70–99)
Glucose-Capillary: 120 mg/dL — ABNORMAL HIGH (ref 70–99)
Glucose-Capillary: 126 mg/dL — ABNORMAL HIGH (ref 70–99)
Glucose-Capillary: 124 mg/dL — ABNORMAL HIGH (ref 70–99)
Glucose-Capillary: 173 mg/dL — ABNORMAL HIGH (ref 70–99)
Glucose-Capillary: 149 mg/dL — ABNORMAL HIGH (ref 70–99)

## 2020-09-29 LAB — MAGNESIUM: Magnesium: 2.3 mg/dL (ref 1.7–2.4)

## 2020-09-29 MED ORDER — DIAZEPAM 2 MG PO TABS
2.0000 mg | ORAL_TABLET | Freq: Three times a day (TID) | ORAL | Status: DC
  Administered 2020-09-29 – 2020-10-04 (×12): 2 mg via ORAL
  Filled 2020-09-29 (×12): qty 1

## 2020-09-29 MED ORDER — POTASSIUM CHLORIDE 10 MEQ/100ML IV SOLN
10.0000 meq | INTRAVENOUS | Status: AC
  Administered 2020-09-29 (×6): 10 meq via INTRAVENOUS
  Filled 2020-09-29 (×6): qty 100

## 2020-09-29 MED ORDER — POTASSIUM CHLORIDE CRYS ER 20 MEQ PO TBCR
40.0000 meq | EXTENDED_RELEASE_TABLET | Freq: Once | ORAL | Status: AC
  Administered 2020-09-29: 40 meq via ORAL
  Filled 2020-09-29: qty 2

## 2020-09-29 MED ORDER — DIAZEPAM 5 MG PO TABS: 10.0000 mg | ORAL_TABLET | Freq: Three times a day (TID) | ORAL | Status: DC

## 2020-09-29 MED ORDER — POTASSIUM CHLORIDE 10 MEQ/100ML IV SOLN: 10.0000 meq | INTRAVENOUS | Status: DC

## 2020-09-29 NOTE — Progress Notes (Signed)
PROGRESS NOTE  Nicholas Caldwell AJO:[id number] DOB: December 06, 1945 DOA: 09/25/2020 PCP: [name], MD  Brief History   Chief Complaint: brought in for shortness of breath  HPI: Nicholas Caldwell is a 74 y.o. male with medical history of NISCM, systolic CHF and mod RV failure, DM2, CKD 4, PAF s/p DCCV, COPD on home O2, OHS/ OSA on 3 L O2 and CPAP & CAD.  The patient is currently sleepy and unable to give me a detailed history. He knows he is at [facility] and know his age but will not tell me why he is in the hospital and falls back to sleep. Per ED notes, he has been more short of breath for about 1 wk and has more fluid on his legs.   Pulse ox was 86% on 4 L. Stayed in 80s on 6 L. Subsequently placed on a non re-breather- pulse ox has been 100%. Given 40 mg IV Lasix. CXR reveals pulmonary edema.   Pt is intermittently non-compliant with BIPAP and O2. This morning he requested to be placed back on BIPAP. Respiratory acidosis is resolved on ABG this am.  Consultants  . Pulmonology  Procedures  . None  Antibiotics   Anti-infectives (From admission, onward)   None     Subjective  The patient is lying in bed. No new complaints.  Objective   Vitals:  Vitals:   09/29/20 1100 09/29/20 1203  BP:  137/78  Pulse: 81 82  Resp: 18 16  Temp:  98.1 F (36.7 C)  SpO2: 93% 94%    Exam:  Constitutional:  . The patient is awake, alert, and oriented x 3. No acute distress. Respiratory:  . No increased work of breathing. . No wheezes, rales, or rhonchi . Diminished breath sounds bilaterally . No tactile fremitus Cardiovascular:  . Regular rate and rhythm . No murmurs, ectopy, or gallups. . No lateral PMI. No thrills. Abdomen:  . Abdomen is soft, non-tender, non-distended . No hernias, masses, or organomegaly . Normoactive bowel sounds.  Musculoskeletal:  . No cyanosis, clubbing, or edema Skin:  . No rashes, lesions, ulcers . palpation of skin: no induration or nodules Neurologic:   . CN 2-12 intact . Sensation all 4 extremities intact Psychiatric:  . Mental status o Mood, affect appropriate o Orientation to person, place, time  . judgment and insight appear intact  I have personally reviewed the following:   Today's Data  . Vitals, CBC, BMP, ABG  Imaging  . CXR  Scheduled Meds: . amiodarone  100 mg Oral Daily  . apixaban  5 mg Oral BID  . diazepam  2 mg Oral TID  . insulin aspart  0-9 Units Subcutaneous Q4H  . sodium chloride flush  3 mL Intravenous Q12H   Continuous Infusions: . sodium chloride    . furosemide 120 mg (09/29/20 9470)    Principal Problem:   Acute respiratory failure (HCC) Active Problems:   Essential hypertension   Type 2 diabetes mellitus with neuropathy   Morbid obesity (HCC)   PAF (paroxysmal atrial fibrillation) (HCC)   COPD (chronic obstructive pulmonary disease) (HCC)   Acute on chronic combined systolic and diastolic CHF (congestive heart failure) (HCC)   OSA (obstructive sleep apnea)   Acute encephalopathy   Acute respiratory failure with hypoxia and hypercapnia (HCC)   Acute systolic CHF (congestive heart failure) (HCC)   LOS: 4 days   A & P  Acute on chronic hypoxic and hypercapneic respiratory failure: The patient was admitted with respiratory acidosis and  hypoxia that required BIPAP. Repeat ABG with the patient on BIPAP demonstrated somewhat improved, but continued respiratory acidosis and hypercarbia. Pulmonology was consulted and adjustments were made to the BIPAP settings. Will continue to monitor. Per pulmonology the patient need only wear BIPAP at night. He has been intermittently refusing to wear his O2 or BIPAP. He asked for BIPAP this morning.  Acute on chronic combined systolic and diastolic CHF (congestive heart   failure) with severe RV systolic failure and dilatation: The patient is on 3-4 L of O2 at home. He presented with pulmonary edema, pedal edema and BNP elevation of 3275. He is receiving diuresis  with lasix 120 mg tid. As last echocardiogram was performed in 08/2019, it was repeated. Today's study report is pending. I's and O's are being following closely. He is currently negative 5.196 liters in terms of fluid balance. Continue to monitor.  Acute encephalopathy: Resolved. Due to hypercarbia and possibly Neurontin which has been held. Improved mental status, although still with hypercarbia.   CKD 4: Creatinine/GFR are worsening slightly every day. Today's creatinine is 4.06. Monitor creatinine, electrolytes, and monitor volume status.   Mildly elevated Troponin: Likely due to cardiac strain in setting of severe renal disease   Type 2 diabetes mellitus with neuropathy: HBA1c of 09/25/2020 was 6.7. Glucoses have run from 117-150 in the last 24 hours.  Morbid obesity: This complicates all cares. Body mass index is 35.26 kg/m.  PAF (paroxysmal atrial fibrillation): Sinus rhythm per EKG 09/25/2020. Monitor on telemetry. Amiodarone and Apixaban to be continued. Rate is well controlled.   Chronic pain: Narcotics are held as them may depress respiration and worsen respiratory acidosis and hypercarbia. He is receiving IV acetaminophen. I will start the patient on a low dose of hydrocodone due to his severe hip pain. Will avoid restarting his 10/325 mg percocets as he takes at time out of concern for respiratory depression and worsening acidosis.   Normocytic anemia: Hb has ranged from 8.8-11 in the past year. Hemoglobin is stable at 10.3.  H/o PEA arrest in 10/20: Noted. Monitor on telemetry.  I have seen and examined this patient myself. I have spent 32 minutes in his evaluation and care.  DVT prophylaxis: Eliquis  Code Status: Full code  Family Communication: None is available. Disposition Plan:  Status is: Inpatient  Remains inpatient appropriate because:Inpatient level of care appropriate due to severity of illness   Dispo: The patient is from: Home               Anticipated d/c is to: SNF              Anticipated d/c date is: 3 days              Patient currently is not medically stable to d/c.  Desiray Orchard, DO [facility] Direct contact: see [email or web address]  7PM-7AM contact night coverage as above 09/29/2020, 1:50 PM  LOS: 1 day

## 2020-09-29 NOTE — Progress Notes (Signed)
RT at bedside to obtain ABG. Pt stated he would like to be placed back on BiPAP. RT placed pt back on BiPAP. Pt tolerated well.

## 2020-09-30 DIAGNOSIS — G9340 Encephalopathy, unspecified: Secondary | ICD-10-CM | POA: Diagnosis not present

## 2020-09-30 DIAGNOSIS — I5043 Acute on chronic combined systolic (congestive) and diastolic (congestive) heart failure: Secondary | ICD-10-CM | POA: Diagnosis not present

## 2020-09-30 DIAGNOSIS — J9601 Acute respiratory failure with hypoxia: Secondary | ICD-10-CM | POA: Diagnosis not present

## 2020-09-30 DIAGNOSIS — I5021 Acute systolic (congestive) heart failure: Secondary | ICD-10-CM | POA: Diagnosis not present

## 2020-09-30 LAB — BASIC METABOLIC PANEL
Anion gap: 16 — ABNORMAL HIGH (ref 5–15)
BUN: 57 mg/dL — ABNORMAL HIGH (ref 8–23)
CO2: 33 mmol/L — ABNORMAL HIGH (ref 22–32)
Calcium: 8.1 mg/dL — ABNORMAL LOW (ref 8.9–10.3)
Chloride: 95 mmol/L — ABNORMAL LOW (ref 98–111)
Creatinine, Ser: 3.69 mg/dL — ABNORMAL HIGH (ref 0.61–1.24)
GFR, Estimated: 17 mL/min — ABNORMAL LOW (ref >60)
Glucose, Bld: 143 mg/dL — ABNORMAL HIGH (ref 70–99)
Potassium: 3.6 mmol/L (ref 3.5–5.1)
Sodium: 144 mmol/L (ref 135–145)

## 2020-09-30 LAB — CBC WITH DIFFERENTIAL/PLATELET
Abs Immature Granulocytes: 0.04 10*3/uL (ref 0.00–0.07)
Basophils Absolute: 0 10*3/uL (ref 0.0–0.1)
Basophils Relative: 0 %
Eosinophils Absolute: 0 10*3/uL (ref 0.0–0.5)
Eosinophils Relative: 0 %
HCT: 36.8 % — ABNORMAL LOW (ref 39.0–52.0)
Hemoglobin: 10.9 g/dL — ABNORMAL LOW (ref 13.0–17.0)
Immature Granulocytes: 1 %
Lymphocytes Relative: 9 %
Lymphs Abs: 0.6 10*3/uL — ABNORMAL LOW (ref 0.7–4.0)
MCH: 25.5 pg — ABNORMAL LOW (ref 26.0–34.0)
MCHC: 29.6 g/dL — ABNORMAL LOW (ref 30.0–36.0)
MCV: 86.2 fL (ref 80.0–100.0)
Monocytes Absolute: 0.6 10*3/uL (ref 0.1–1.0)
Monocytes Relative: 10 %
Neutro Abs: 5.3 10*3/uL (ref 1.7–7.7)
Neutrophils Relative %: 80 %
Platelets: 278 10*3/uL (ref 150–400)
RBC: 4.27 MIL/uL (ref 4.22–5.81)
RDW: 19.2 % — ABNORMAL HIGH (ref 11.5–15.5)
WBC: 6.6 10*3/uL (ref 4.0–10.5)
nRBC: 0 % (ref 0.0–0.2)

## 2020-09-30 LAB — GLUCOSE, CAPILLARY
Glucose-Capillary: 117 mg/dL — ABNORMAL HIGH (ref 70–99)
Glucose-Capillary: 119 mg/dL — ABNORMAL HIGH (ref 70–99)
Glucose-Capillary: 131 mg/dL — ABNORMAL HIGH (ref 70–99)
Glucose-Capillary: 135 mg/dL — ABNORMAL HIGH (ref 70–99)
Glucose-Capillary: 150 mg/dL — ABNORMAL HIGH (ref 70–99)
Glucose-Capillary: 152 mg/dL — ABNORMAL HIGH (ref 70–99)
Glucose-Capillary: 160 mg/dL — ABNORMAL HIGH (ref 70–99)

## 2020-09-30 NOTE — Plan of Care (Signed)
  Problem: Clinical Measurements: Goal: Ability to maintain clinical measurements within normal limits will improve Outcome: Progressing   Problem: Clinical Measurements: Goal: Diagnostic test results will improve Outcome: Progressing   Problem: Clinical Measurements: Goal: Respiratory complications will improve Outcome: Progressing   Problem: Nutrition: Goal: Adequate nutrition will be maintained Outcome: Progressing   Problem: Coping: Goal: Level of anxiety will decrease Outcome: Progressing   Problem: Pain Managment: Goal: General experience of comfort will improve Outcome: Progressing

## 2020-09-30 NOTE — Progress Notes (Addendum)
PROGRESS NOTE  Nicholas Caldwell [id number] DOB: Nov 23, 1945 DOA: 09/25/2020 PCP: [name], MD  Brief History   Chief Complaint: brought in for shortness of breath  HPI: Nicholas Caldwell is a 74 y.o. male with medical history of NISCM, systolic CHF and mod RV failure, DM2, CKD 4, PAF s/p DCCV, COPD on home O2, OHS/ OSA on 3 L O2 and CPAP & CAD.  The patient is currently sleepy and unable to give me a detailed history. He knows he is at [facility] and know his age but will not tell me why he is in the hospital and falls back to sleep. Per ED notes, he has been more short of breath for about 1 wk and has more fluid on his legs.   Pulse ox was 86% on 4 L. Stayed in 80s on 6 L. Subsequently placed on a non re-breather- pulse ox has been 100%. Given 40 mg IV Lasix. CXR reveals pulmonary edema.   Pt is intermittently non-compliant with BIPAP and O2. This morning he requested to be placed back on BIPAP. Respiratory acidosis is resolved on ABG this am.  Consultants  . Pulmonology  Procedures  . None  Antibiotics   Anti-infectives (From admission, onward)   None     Subjective  The patient is lying in bed. Nursing reports that the patient has had 3 loose stools this morning.   Objective   Vitals:  Vitals:   09/30/20 0812 09/30/20 1200  BP:    Pulse:  80  Resp:    Temp:  98.4 F (36.9 C)  SpO2: 94% 90%   Exam:  Constitutional:  . The patient is awake, alert, and oriented x 3. No acute distress. Respiratory:  . No increased work of breathing. . No wheezes, rales, or rhonchi . Diminished breath sounds bilaterally . No tactile fremitus Cardiovascular:  . Regular rate and rhythm . No murmurs, ectopy, or gallups. . No lateral PMI. No thrills. Abdomen:  . Abdomen is soft, non-tender, non-distended . No hernias, masses, or organomegaly . Normoactive bowel sounds.  Musculoskeletal:  . No cyanosis, clubbing, or edema Skin:  . No rashes, lesions, ulcers . palpation of skin:  no induration or nodules Neurologic:  . CN 2-12 intact . Sensation all 4 extremities intact Psychiatric:  . Mental status o Mood, affect appropriate o Orientation to person, place, time  . judgment and insight appear intact  I have personally reviewed the following:   Today's Data  . Vitals, CBC, BMP  Imaging  . CXR  Scheduled Meds: . amiodarone  100 mg Oral Daily  . apixaban  5 mg Oral BID  . diazepam  2 mg Oral TID  . insulin aspart  0-9 Units Subcutaneous Q4H  . sodium chloride flush  3 mL Intravenous Q12H   Continuous Infusions: . sodium chloride    . furosemide 120 mg (09/30/20 0601)    Principal Problem:   Acute respiratory failure (HCC) Active Problems:   Essential hypertension   Type 2 diabetes mellitus with neuropathy   Morbid obesity (HCC)   PAF (paroxysmal atrial fibrillation) (HCC)   COPD (chronic obstructive pulmonary disease) (HCC)   Acute on chronic combined systolic and diastolic CHF (congestive heart failure) (HCC)   OSA (obstructive sleep apnea)   Acute encephalopathy   Acute respiratory failure with hypoxia and hypercapnia (HCC)   Acute systolic CHF (congestive heart failure) (HCC)   LOS: 5 days   A & P  Acute on chronic hypoxic and hypercapneic respiratory failure:  The patient was admitted with respiratory acidosis and hypoxia that required BIPAP. Repeat ABG with the patient on BIPAP demonstrated somewhat improved, but continued respiratory acidosis and hypercarbia. Pulmonology was consulted and adjustments were made to the BIPAP settings. Will continue to monitor. Per pulmonology the patient need only wear BIPAP at night. He has been intermittently refusing to wear his O2 or BIPAP. He asked for BIPAP this morning.  Acute on chronic combined systolic and diastolic CHF (congestive heart   failure) with severe RV systolic failure and dilatation: The patient is on 3-4 L of O2 at home. He presented with pulmonary edema, pedal edema and BNP elevation of  3275. He is receiving diuresis with lasix 120 mg tid. As last echocardiogram was performed in 08/2019, it was repeated. Today's study report is pending. I's and O's are being following closely. He is currently negative 5.196 liters in terms of fluid balance. Continue to monitor.  Acute encephalopathy: Resolved. Due to hypercarbia and possibly Neurontin which has been held. Improved mental status, although still with hypercarbia.   Diarrhea: 3 loose stools in the last 24 hours. Rectal tube place per nursing request. Doubt C Diff as patient has no fever, no increased WBC, and no abdominal pain. Enteric pathogen panel ordered.  CKD 4: Creatinine/GFR are worsening slightly every day. Today's creatinine is 4.06. Monitor creatinine, electrolytes, and monitor volume status.   Mildly elevated Troponin: Likely due to cardiac strain in setting of severe renal disease   Type 2 diabetes mellitus with neuropathy: HBA1c of 09/25/2020 was 6.7. Glucoses have run from 117-149 in the last 24 hours.  Morbid obesity: This complicates all cares. Body mass index is 35.26 kg/m.  PAF (paroxysmal atrial fibrillation): Sinus rhythm per EKG 09/25/2020. Monitor on telemetry. Amiodarone and Apixaban to be continued. Rate is well controlled.   Chronic pain: Narcotics are held as them may depress respiration and worsen respiratory acidosis and hypercarbia. He is receiving IV acetaminophen. I will start the patient on a low dose of hydrocodone due to his severe hip pain. Will avoid restarting his 10/325 mg percocets as he takes at time out of concern for respiratory depression and worsening acidosis.   Normocytic anemia: Hb has ranged from 8.8-11 in the past year. Hemoglobin is stable at 10.3.  H/o PEA arrest in 10/20: Noted. Monitor on telemetry.  I have seen and examined this patient myself. I have spent 42 minutes in his evaluation and care. More than 50% of this was spent in counseling with the patient's  wife.  DVT prophylaxis: Eliquis  Code Status: Full code  Family Communication: I have discussed this patient in detail with his wife. All questions answered to the best of my ability Disposition Plan:  Status is: Inpatient  Remains inpatient appropriate because:Inpatient level of care appropriate due to severity of illness   Dispo: The patient is from: Home              Anticipated d/c is to: SNF              Anticipated d/c date is: 3 days              Patient currently is not medically stable to d/c.  Crystalann Korf, DO [facility] Direct contact: see [email or web address]  7PM-7AM contact night coverage as above 09/30/2020, 12:50 PM  LOS: 1 day

## 2020-09-30 NOTE — Progress Notes (Addendum)
Patient refused to change high flow nasal cannula. Attempted to put on high flow for 40-50 min then patient kept asking to change NRB mask. Unsuccessful to keep on high flow. Hold valium today due to lethargy and oral intake is very poor yesterday and today, but today is better than yesterday. At times, patient agreed with putting on BIPAP several times while he was taking naps. Patient's wife talked to MD regarding patient's conditions and care plan by phone. Patient has liquid stool today, sent stool specimen for stool c-diff and placed flexi-seal. HS Truman Hayward RN

## 2020-10-01 DIAGNOSIS — J9621 Acute and chronic respiratory failure with hypoxia: Secondary | ICD-10-CM | POA: Diagnosis not present

## 2020-10-01 DIAGNOSIS — J9601 Acute respiratory failure with hypoxia: Secondary | ICD-10-CM | POA: Diagnosis not present

## 2020-10-01 DIAGNOSIS — L89153 Pressure ulcer of sacral region, stage 3: Secondary | ICD-10-CM

## 2020-10-01 DIAGNOSIS — I5043 Acute on chronic combined systolic (congestive) and diastolic (congestive) heart failure: Secondary | ICD-10-CM | POA: Diagnosis not present

## 2020-10-01 DIAGNOSIS — I5021 Acute systolic (congestive) heart failure: Secondary | ICD-10-CM | POA: Diagnosis not present

## 2020-10-01 DIAGNOSIS — G9340 Encephalopathy, unspecified: Secondary | ICD-10-CM | POA: Diagnosis not present

## 2020-10-01 LAB — MAGNESIUM: Magnesium: 2 mg/dL (ref 1.7–2.4)

## 2020-10-01 LAB — GASTROINTESTINAL PANEL BY PCR, STOOL (REPLACES STOOL CULTURE)

## 2020-10-01 LAB — PHOSPHORUS: Phosphorus: 3.5 mg/dL (ref 2.5–4.6)

## 2020-10-01 LAB — GLUCOSE, CAPILLARY
Glucose-Capillary: 159 mg/dL — ABNORMAL HIGH (ref 70–99)
Glucose-Capillary: 134 mg/dL — ABNORMAL HIGH (ref 70–99)
Glucose-Capillary: 140 mg/dL — ABNORMAL HIGH (ref 70–99)
Glucose-Capillary: 148 mg/dL — ABNORMAL HIGH (ref 70–99)
Glucose-Capillary: 116 mg/dL — ABNORMAL HIGH (ref 70–99)

## 2020-10-01 LAB — BRAIN NATRIURETIC PEPTIDE: B Natriuretic Peptide: 3013.5 pg/mL — ABNORMAL HIGH (ref 0.0–100.0)

## 2020-10-01 MED ORDER — METOLAZONE 2.5 MG PO TABS
5.0000 mg | ORAL_TABLET | Freq: Two times a day (BID) | ORAL | Status: DC
  Administered 2020-10-01 – 2020-10-02 (×2): 5 mg via ORAL
  Filled 2020-10-01 (×2): qty 2

## 2020-10-01 NOTE — Plan of Care (Signed)

## 2020-10-01 NOTE — Progress Notes (Signed)
PROGRESS NOTE  Nicholas Caldwell ZOX:[id number] DOB: 1946-04-05 DOA: 09/25/2020 PCP: [name], MD  Brief History   Chief Complaint: brought in for shortness of breath  HPI: Nicholas Caldwell is a 74 y.o. male with medical history of NISCM, systolic CHF and mod RV failure, DM2, CKD 4, PAF s/p DCCV, COPD on home O2, OHS/ OSA on 3 L O2 and CPAP & CAD.  The patient is currently sleepy and unable to give me a detailed history. He knows he is at [facility] and know his age but will not tell me why he is in the hospital and falls back to sleep. Per ED notes, he has been more short of breath for about 1 wk and has more fluid on his legs.   Pulse ox was 86% on 4 L. Stayed in 80s on 6 L. Subsequently placed on a non re-breather- pulse ox has been 100%. Given 40 mg IV Lasix. CXR reveals pulmonary edema.   Pt is intermittently non-compliant with BIPAP and O2. He continues to require BIPAP with 70% O2 or NRB with 15 L O2 to maintain SaO2 in the 90's. Consultants  . Pulmonology  Procedures  . None  Antibiotics   Anti-infectives (From admission, onward)   None     Subjective  The patient is lying in bed. No new complaints.  Objective   Vitals:  Vitals:   10/01/20 0758 10/01/20 1201  BP: (!) 150/67 126/78  Pulse: 79 78  Resp: (!) 24 20  Temp: 100.2 F (37.9 C) 99.1 F (37.3 C)  SpO2: (!) 86% 97%   Exam:  Constitutional:  . The patient is awake, alert, and oriented x 3. No acute distress. Respiratory:  . No increased work of breathing. . No wheezes, rales, or rhonchi . Diminished breath sounds bilaterally . No tactile fremitus Cardiovascular:  . Regular rate and rhythm . No murmurs, ectopy, or gallups. . No lateral PMI. No thrills. Abdomen:  . Abdomen is soft, non-tender, non-distended . No hernias, masses, or organomegaly . Normoactive bowel sounds.  Musculoskeletal:  . No cyanosis, clubbing, or edema Skin:  . No rashes, lesions, ulcers . palpation of skin: no induration or  nodules Neurologic:  . CN 2-12 intact . Sensation all 4 extremities intact Psychiatric:  . Mental status o Mood, affect appropriate o Orientation to person, place, time  . judgment and insight appear intact  I have personally reviewed the following:   Today's Data  . Vitals, CBC, BMP  Imaging  . CXR  Scheduled Meds: . amiodarone  100 mg Oral Daily  . apixaban  5 mg Oral BID  . diazepam  2 mg Oral TID  . insulin aspart  0-9 Units Subcutaneous Q4H  . sodium chloride flush  3 mL Intravenous Q12H   Continuous Infusions: . sodium chloride    . furosemide 120 mg (10/01/20 1330)    Principal Problem:   Acute respiratory failure (HCC) Active Problems:   Essential hypertension   Type 2 diabetes mellitus with neuropathy   Morbid obesity (HCC)   PAF (paroxysmal atrial fibrillation) (HCC)   COPD (chronic obstructive pulmonary disease) (HCC)   Acute on chronic combined systolic and diastolic CHF (congestive heart failure) (HCC)   OSA (obstructive sleep apnea)   Acute encephalopathy   Acute respiratory failure with hypoxia and hypercapnia (HCC)   Acute systolic CHF (congestive heart failure) (HCC)   LOS: 6 days   A & P  Acute on chronic hypoxic and hypercapneic respiratory failure: The patient was  admitted with respiratory acidosis and hypoxia that required BIPAP. Repeat ABG with the patient on BIPAP demonstrated somewhat improved, but continued respiratory acidosis and hypercarbia. Pulmonology was consulted and adjustments were made to the BIPAP settings. Will continue to monitor. Per pulmonology the patient need only wear BIPAP at night. He has been intermittently refusing to wear his O2 or BIPAP. He is currently requiring 15 L by [place] to maintain SaO2 in the 90's. I have considered CTA chest to rule out PE, but cannot due to creatinine of 3.69. Due to patient's high O2 requirements I doubt that he could tolerate VQ scan. I have asked pulmonology to come back to re-evaluate.  Acute  on chronic combined systolic and diastolic CHF (congestive heart   failure) with severe RV systolic failure and dilatation: The patient is on 3-4 L of O2 at home. He presented with pulmonary edema, pedal edema and BNP elevation of 3275. He is receiving diuresis with lasix 120 mg tid. As last echocardiogram was performed in 08/2019, it was repeated. Today's study report is pending. I's and O's are being following closely. He is currently negative 9.478.3 liters in terms of fluid balance. Continue to monitor.  Acute encephalopathy: Resolved. Due to hypercarbia and possibly Neurontin which has been held. Improved mental status, although still with hypercarbia.   Diarrhea: 3 loose stools in the last 24 hours. Rectal tube place per nursing request. Doubt C Diff as patient has no fever, no increased WBC, and no abdominal pain. Enteric pathogen panel ordered.  CKD 4: Creatinine/GFR are worsening slightly every day. Today's creatinine is 3.69. Monitor creatinine, electrolytes, and monitor volume status.   Mildly elevated Troponin: Likely due to cardiac strain in setting of severe renal disease   Type 2 diabetes mellitus with neuropathy: HBA1c of 09/25/2020 was 6.7. Glucoses have run from 134-152 in the last 24 hours.  Morbid obesity: This complicates all cares. Body mass index is 35.26 kg/m.  PAF (paroxysmal atrial fibrillation): Sinus rhythm per EKG 09/25/2020. Monitor on telemetry. Amiodarone and Apixaban to be continued. Rate is well controlled.   Chronic pain: Narcotics are held as them may depress respiration and worsen respiratory acidosis and hypercarbia. He is receiving IV acetaminophen. I will start the patient on a low dose of hydrocodone due to his severe hip pain. Will avoid restarting his 10/325 mg percocets as he takes at time out of concern for respiratory depression and worsening acidosis.   Normocytic anemia: Hb has ranged from 8.8-11 in the past year. Hemoglobin is stable at  10.9.  H/o PEA arrest in 10/20: Noted. Monitor on telemetry.  I have seen and examined this patient myself. I have spent 38 minutes in his evaluation and care.   DVT prophylaxis: Eliquis  Code Status: Full code  Family Communication: I have discussed this patient in detail with his wife. All questions answered to the best of my ability Disposition Plan:  Status is: Inpatient  Remains inpatient appropriate because:Inpatient level of care appropriate due to severity of illness  Dispo: The patient is from: Home              Anticipated d/c is to: SNF              Anticipated d/c date is: 3 days              Patient currently is not medically stable to d/c.  Nicholas Sweis, DO [facility] Direct contact: see [email or web address]  7PM-7AM contact night coverage as above 10/01/2020, 1:52  PM  LOS: 1 day

## 2020-10-01 NOTE — TOC Initial Note (Signed)
Transition of Care [facility]) - Initial/Assessment Note    Patient Details  Name: Nicholas Caldwell MRN: [id number] Date of Birth: 10-May-1946  [facility]) CM/SW Contact:    Nicholas Caldwell, [place] Phone Number: 10/01/2020, 9:48 AM  Clinical Narrative:                 CSW reached out to pt's spouse, didn't get an answe. CSW reached out to pt's daughter Nicholas Caldwell, spoke with ehr about MD's recommendation of SNF. Nicholas Caldwell stated she lives in [place] but has daily contact with her father. Nicholas Caldwell states her father needs SNF but is very stubborn and will probably decline option. Nicholas Caldwell went on to state that her father had [facility] in the past after a hospital stay and mostly refused to let PT in the home and stayed in the bed most of the time. Nicholas Caldwell stated that she believes her father takes too much pain medication at times as well. Nicholas Caldwell stated she would reach out to pt's wife to discuss why pt needs SNF and try to convince him of the same and have her reach out to [facility]. Pt has [facility] as a backup. Pt has received both vaccines and the booster. CSW will continue to follow for dc planning.        Patient Goals and CMS Choice        Expected Discharge Plan and Services                                                Prior Living Arrangements/Services                       Activities of Daily Living      Permission Sought/Granted                  Emotional Assessment              Admission diagnosis:  Acute respiratory failure (HCC) [N56.21] Acute systolic CHF (congestive heart failure) (HCC) [I50.21] Congestive heart failure, unspecified HF chronicity, unspecified heart failure type ([place]) [I50.9] Patient Active Problem List   Diagnosis Date Noted  . Acute systolic CHF (congestive heart failure) ([place]) 09/25/2020  . Acute respiratory failure with hypercapnia ([place])   . Encounter for intubation   . Pressure injury of skin 08/21/2019  . Cardiac arrest  ([place]) 08/20/2019  . Palliative care by specialist   . DNR (do not resuscitate) discussion   . Fatigue   . Acute CHF (congestive heart failure) ([place]) 07/07/2019  . Acute on chronic heart failure ([place]) 06/09/2019  . Lobar pneumonia ([place]) 04/14/2019  . Hyperkalemia 04/10/2019  . Cerebral embolism with cerebral infarction 03/21/2019  . Gastritis and gastroduodenitis   . NSVT (nonsustained ventricular tachycardia) ([place]) 03/08/2019  . Tobacco abuse counseling 03/08/2019  . Iron deficiency anemia   . Acute on chronic systolic CHF (congestive heart failure) ([place]) 03/06/2019  . Diabetes mellitus type 2 in obese ([place])   . Primary osteoarthritis of right hip   . Hypomagnesemia   . Steroid-induced hyperglycemia   . Supplemental oxygen dependent   . Leukocytosis   . Hypokalemia   . Acute on chronic anemia   . Diabetic peripheral neuropathy ([place])   . Acute on chronic systolic (congestive) heart failure ([place])   . Acute on chronic respiratory failure (  [place]) 01/14/2019  . Hypoxia   . Altered mental status   . Heart failure ([place]) 12/30/2018  . AKI (acute kidney injury) ([place])   . Acute respiratory failure ([place]) 11/22/2018  . Acute on chronic respiratory failure with hypoxia and hypercapnia ([place]) 11/13/2018  . Metabolic encephalopathy 37/90/2409  . Acute respiratory failure with hypoxia and hypercapnia ([place]) 07/26/2018  . Acute exacerbation of CHF (congestive heart failure) ([place]) 07/26/2018  . CHF exacerbation ([place]) 05/01/2018  . CHF (congestive heart failure) ([place]) 04/30/2018  . Chronic respiratory failure with hypoxia ([place]) 12/28/2017  . Acute encephalopathy   . Atrial fibrillation with RVR ([place])   . SVT (supraventricular tachycardia) ([place])   . COPD GOLD 0   . Medically noncompliant   . Panlobular emphysema ([place])   . OSA (obstructive sleep apnea)   . Pulmonary hypertension ([place])   . Disorientation   . Pressure injury of skin 09/07/2016  . Acute on chronic combined systolic and diastolic  CHF (congestive heart failure) ([place]) 09/05/2016  . Skin lesion-left heal 09/05/2016  . Chronic systolic CHF (congestive heart failure) ([place]) 07/16/2016  . COPD (chronic obstructive pulmonary disease) ([place]) 07/16/2016  . Uncontrolled type 2 diabetes mellitus with complication ([place])   . Diabetic polyneuropathy associated with diabetes mellitus due to underlying condition ([place])   . Normocytic anemia 06/29/2016  . CAD - Non-obstructive by LHC1/16 01/21/2016  . Prolonged QT interval 10/09/2015  . Nonischemic cardiomyopathy ([place]) 10/09/2015  . PAF (paroxysmal atrial fibrillation) ([place])   . Chronic pain 02/19/2015  . Morbid obesity ([place]) 02/13/2015  . Dyspnea   . Elevated troponin I level 11/01/2014  . COPD exacerbation ([place]) 11/01/2014  . Essential hypertension 10/31/2014  . Type 2 diabetes mellitus with neuropathy 10/31/2014  . Hyperlipidemia  10/31/2014  . Cigarette smoker 10/31/2014   PCP:  Nicholas Milan, MD Pharmacy:   [facility] [place], [address] [place] [address] Phone: [phone] Fax: [place], [place] - [phone] [place] [address] Phone: [phone] Fax: [phone]  [facility] [place], [place] - [address] [place] [address] Phone: [phone] Fax: [phone]  [id number] [place] [id number]: [phone]: [phone]    Social Determinants of Health (SDOH) Interventions    Readmission Risk Interventions Readmission Risk Prevention Plan 08/15/2019 06/13/2019 04/14/2019  Transportation Screening Complete Complete Complete  Medication Review (Press photographer Not Complete Complete Complete  Med  Review Comments requested discharge medication reconciliation by attending - -  PCP or Specialist appointment within 3-5 days of discharge Not Complete Complete Complete  PCP/Specialist Appt Not Complete comments [facility] informed of admit and will contact pt directly regarding follow up appt - 6/17 with cardiology  [place] [facility] Consult Complete Complete Complete  SW Recovery Care/Counseling Consult - Complete Complete  Palliative Care Screening - Not Applicable [place]- Not Applicable Not Applicable  Some recent data might be hidden

## 2020-10-01 NOTE — Progress Notes (Signed)
NAME:  Teegan Brandis, MRN:  [id number], DOB:  [birth year], LOS: 6 ADMISSION DATE:  09/25/2020, CONSULTATION DATE:  11/29 REFERRING MD:  Karie Kirks  CHIEF COMPLAINT:  Acute on chronic respiratory failure  Brief History   74 year old male with history of COPD and right-sided congestive heart failure who is being managed for acute on chronic hypoxemic/hypercapnic respiratory failure.  Of note, on 3-4 L [place] at home.   History of present illness   Patient follows with Dr Melvyn Novas as outpatient.  5 days prior to presentation on 11/23, patient was given steroids with no improvement and therefore sought further treatment in the ED.  He was found to have AKI and requiring Bipap for respiratory failure. BNP 3275.  He was aggressively diuresed and able to be liberated from [place]. Last seen by PCCM on 11/24.  Patient has had continued diuresis this admission with net negative fluid balance 8 L.  Most recent CXR on 11/23 with persistent pulmonary edema.  On exam, he has continued lower extremity edema and abdominal distention which is likely due to volume.  He is remained afebrile with no leukocytosis.   Past Medical History  Heart failure pAfib on Eliquis DM, type II PEA arrest in 2020 Normocytic anemia Chronic pain Morbid obesity  Significant Hospital Events   11/23 Presented to ED  Consults:  Pulmonary Critical Care   Procedures:    Significant Diagnostic Tests:  ECHO 11/24:  LVEF 50-55%, RV is severely reduced with RVSP: 68.  Right atrial severely dilated.  Trivial mitral regurgitation. Tricuspid valve regurgitation is moderate to severe.  Aortic valve not visualized.   Micro Data:    Antimicrobials:    Interim history/subjective:    Objective   Blood pressure 126/78, pulse 78, temperature 99.1 F (37.3 C), temperature source Oral, resp. rate 20, height 6' (1.829 m), weight 110.8 kg, SpO2 97 %.    FiO2 (%):  [70 %] 70 %   Intake/Output Summary (Last 24 hours) at 10/01/2020 1422 Last  data filed at 10/01/2020 0929 Gross per 24 hour  Intake 799 ml  Output 3350 ml  Net -2551 ml   Filed Weights   09/29/20 0415 09/30/20 0300 10/01/20 0331  Weight: 113.4 kg 111.3 kg 110.8 kg    Examination: General: Lethargic but interactive HENT: [place]/AT Lungs: diminished at bases, symmetric expansion, no increased work of breathing  Cardiovascular: S1/S1. No murmur. + lower extremity edema, warm and well perfused Abdomen: distended, soft, + bowel sounds Extremities: no deformities or rashes Neuro: Lethargic, answers questions with prompting, follows commands in all extremities.   Resolved Hospital Problem list     Assessment & Plan:  Acute on chronic hypercarbic/hypoxic respiratory failure in setting of COPD and RV dysfunction -No evidence of COPD, AE. Persistent hypercarbia multifactorial with volume overload and deconditioning. Suspect he has ongoing volume removal needed.  He continues to have edema and abdominal distention c/w volume overload.  Repeat BNP.  Add metolazone to current lasix dose. Change NRB to simple mask or [place]. Goal SpO2 88-90%.  Continue to wear Bipap at night and when sleeping. PT/OT.  Would also recommend limiting sedating medications and reducing valium if possible.   Hypokalemia -replace potassium.  Check Mg and Phos.   CKD,  -Baseline creatinine 2.9-3.5. Creatinine has been stable with diuresis and negative fluid balance, continue diuresis.   Pulmonary will continue to follow.  Labs   CBC: Recent Labs  Lab 09/25/20 1401 09/25/20 1837 09/27/20 0333 09/28/20 0023 09/30/20 0025  WBC 5.6  --  8.1 6.5 6.6  NEUTROABS 3.9  --  4.7 4.6 5.3  HGB 10.4* 12.2* 10.3* 10.3* 10.9*  HCT 36.1* 36.0* 35.3* 36.0* 36.8*  MCV 89.8  --  88.7 89.6 86.2  PLT 328  --  319 312 742    Basic Metabolic Panel: Recent Labs  Lab 09/28/20 0023 09/29/20 0103 09/29/20 1416 09/29/20 2100 09/30/20 0025  NA 141 143 146* 145 144  K 3.2* 2.9* 2.6* 4.2 3.6  CL 100 95* 93*  94* 95*  CO2 27 32 36* 34* 33*  GLUCOSE 120* 130* 141* 133* 143*  BUN 57* 61* 56* 58* 57*  CREATININE 4.06* 3.95* 3.80* 3.63* 3.69*  CALCIUM 8.2* 8.1* 8.3* 8.2* 8.1*  MG  --  2.3  --   --   --    GFR: Estimated Creatinine Clearance: 23.3 mL/min (A) (by C-G formula based on SCr of 3.69 mg/dL (H)). Recent Labs  Lab 09/25/20 1401 09/26/20 1343 09/27/20 0333 09/28/20 0023 09/30/20 0025  PROCALCITON  --  0.31 0.33 0.33  --   WBC 5.6  --  8.1 6.5 6.6    Liver Function Tests: Recent Labs  Lab 09/25/20 1401  AST 20  ALT 13  ALKPHOS 80  BILITOT 0.8  PROT 7.2  ALBUMIN 3.1*   No results for input(s): LIPASE, AMYLASE in the last 168 hours. No results for input(s): AMMONIA in the last 168 hours.  ABG    Component Value Date/Time   PHART 7.349 (L) 09/29/2020 0859   PCO2ART 63.9 (H) 09/29/2020 0859   PO2ART 69.1 (L) 09/29/2020 0859   HCO3 34.4 (H) 09/29/2020 0859   TCO2 29 09/25/2020 1837   ACIDBASEDEF 0.4 09/26/2020 0618   O2SAT 92.8 09/29/2020 0859     Coagulation Profile: No results for input(s): INR, PROTIME in the last 168 hours.  Cardiac Enzymes: No results for input(s): CKTOTAL, CKMB, CKMBINDEX, TROPONINI in the last 168 hours.  HbA1C: Hgb A1c MFr Bld  Date/Time Value Ref Range Status  09/25/2020 10:03 PM 6.7 (H) 4.8 - 5.6 % Final    Comment:    (NOTE) Pre diabetes:          5.7%-6.4%  Diabetes:              >6.4%  Glycemic control for   <7.0% adults with diabetes   08/21/2019 12:17 AM 7.4 (H) 4.8 - 5.6 % Final    Comment:    (NOTE)         Prediabetes: 5.7 - 6.4         Diabetes: >6.4         Glycemic control for adults with diabetes: <7.0     CBG: Recent Labs  Lab 09/30/20 2021 09/30/20 2353 10/01/20 0505 10/01/20 0844 10/01/20 1102  GLUCAP 152* 150* 159* 134* 140*    Review of Systems:   Unable to obtain due to clinical status.   Past Medical History  He,  has a past medical history of Atrial flutter ([place]), CAD (coronary artery  disease), Chronic pain, Chronic systolic CHF (congestive heart failure) ([place]), COPD (chronic obstructive pulmonary disease) ([place]), Diabetes mellitus without complication ([place]), Ex-smoker (11/2017), Hypercholesteremia, Hypertension, NICM (nonischemic cardiomyopathy) ([place]), PAF (paroxysmal atrial fibrillation) ([place]), Pulmonary hypertension ([place]), and Tobacco abuse.   Surgical History    Past Surgical History:  Procedure Laterality Date  . BIOPSY  03/11/2019   Procedure: BIOPSY;  Surgeon: Lavena Bullion, DO;  Location: [facility] ENDOSCOPY;  Service: Gastroenterology;;  . BIOPSY  03/15/2019  Procedure: BIOPSY;  Surgeon: Irving Copas., MD;  Location: [facility];  Service: Gastroenterology;;  . CARDIOVERSION N/A 07/30/2018   Procedure: CARDIOVERSION;  Surgeon: Larey Dresser, MD;  Location: [facility] ENDOSCOPY;  Service: Cardiovascular;  Laterality: N/A;  . COLONOSCOPY N/A 03/11/2019   Procedure: COLONOSCOPY;  Surgeon: Lavena Bullion, DO;  Location: [facility];  Service: Gastroenterology;  Laterality: N/A;  . ENTEROSCOPY N/A 03/15/2019   Procedure: ENTEROSCOPY;  Surgeon: Rush Landmark Telford Nab., MD;  Location: [facility];  Service: Gastroenterology;  Laterality: N/A;  . ESOPHAGOGASTRODUODENOSCOPY Left 03/11/2019   Procedure: ESOPHAGOGASTRODUODENOSCOPY (EGD);  Surgeon: Lavena Bullion, DO;  Location: [facility] ENDOSCOPY;  Service: Gastroenterology;  Laterality: Left;  . LEFT AND RIGHT HEART CATHETERIZATION WITH CORONARY ANGIOGRAM N/A 11/06/2014   Procedure: LEFT AND RIGHT HEART CATHETERIZATION WITH CORONARY ANGIOGRAM;  Surgeon: Jettie Booze, MD;  Location: [facility] CATH LAB;  Service: Cardiovascular;  Laterality: N/A;  . RIGHT/LEFT HEART CATH AND CORONARY ANGIOGRAPHY N/A 12/06/2018   Procedure: RIGHT/LEFT HEART CATH AND CORONARY ANGIOGRAPHY;  Surgeon: Larey Dresser, MD;  Location: [facility] CV LAB;  Service: Cardiovascular;  Laterality: N/A;  . SPLENECTOMY    . SUBMUCOSAL TATTOO INJECTION   03/15/2019   Procedure: SUBMUCOSAL TATTOO INJECTION;  Surgeon: Rush Landmark Telford Nab., MD;  Location: [place];  Service: Gastroenterology;;     Social History   reports that he quit smoking about 22 months ago. His smoking use included cigarettes. He has a 34.00 pack-year smoking history. He has never used smokeless tobacco. He reports that he does not drink alcohol and does not use drugs.   Family History   His family history includes Heart disease in his father and mother; Heart failure in his mother; Hypertension in his mother and sister. There is no history of Heart attack, Stroke, Colon cancer, or Esophageal cancer.   Allergies Allergies  Allergen Reactions  . Bidil [Isosorb Dinitrate-Hydralazine] Shortness Of Breath  . Benazepril Nausea And Vomiting  . Brimonidine     Other reaction(s): Other (See Comments)  . Metformin     Other reaction(s): Cramps (ALLERGY/intolerance), Other (See Comments)  . Morphine Rash     Home Medications  Prior to Admission medications   Medication Sig Start Date End Date Taking? Authorizing Provider  albuterol (PROVENTIL) (2.5 MG/3ML) 0.083% nebulizer solution Take 3 mLs (2.5 mg total) by nebulization every 4 (four) hours as needed for wheezing or shortness of breath. 06/21/20  Yes Tanda Rockers, MD  albuterol (VENTOLIN HFA) 108 (90 Base) MCG/ACT inhaler Inhale 2 puffs into the lungs every 6 (six) hours as needed for wheezing or shortness of breath.   Yes [provider]  amiodarone (PACERONE) 200 MG tablet Take 0.5 tablets (100 mg total) by mouth daily. 08/28/20  Yes Larey Dresser, MD  ascorbic acid (VITAMIN C) 500 MG tablet Take 500 mg by mouth 3 (three) times a week. Monday Wednesday Friday   Yes [provider]  Budeson-Glycopyrrol-Formoterol (BREZTRI AEROSPHERE) 160-9-4.8 MCG/ACT AERO Inhale 2 puffs into the lungs 2 (two) times daily. 06/21/20  Yes Tanda Rockers, MD  carbamide peroxide (DEBROX) 6.5 % OTIC solution Place  5-10 drops into both ears 3 (three) times daily.   Yes [provider]  Carboxymethylcellulose Sod PF 0.25 % SOLN Place 1 drop into both eyes 4 (four) times daily.   Yes [provider]  cetirizine (ZYRTEC) 10 MG tablet Take 10 mg by mouth daily as needed for allergies.    Yes [provider]  Cholecalciferol 50 MCG (2000 UT) TABS Take 2,000 Units by mouth daily.   Yes [provider]  diazepam (VALIUM) 10 MG tablet Take 10 mg by mouth 3 (three) times daily.   Yes [provider]  Ensure Max Protein (ENSURE MAX PROTEIN) LIQD Take 330 mLs (11 oz total) by mouth daily. 08/29/19  Yes Hosie Poisson, MD  famotidine (PEPCID) 20 MG tablet Take 1 tablet (20 mg total) by mouth 2 (two) times daily. 10/27/19  Yes Larey Dresser, MD  FEROSUL 325 (65 Fe) MG tablet Take 325 mg by mouth 3 (three) times a week. Monday Wednesday Friday 03/31/19  Yes [provider]  fluticasone (FLONASE) 50 MCG/ACT nasal spray Place 2 sprays into both nostrils daily as needed for allergies.    Yes [provider]  gabapentin (NEURONTIN) 300 MG capsule Take 1 capsule (300 mg total) by mouth 2 (two) times daily. Patient taking differently: Take 300 mg by mouth daily.  06/13/19  Yes Swayze, Ava, DO  hydrocortisone (ANUSOL-HC) 2.5 % rectal cream Place 1 application rectally 2 (two) times daily as needed for hemorrhoids or itching.    Yes [provider]  hydrocortisone 1 % lotion Apply 1 application topically See admin instructions. Apply small amount to back twice daily for itchy rash as needed   Yes [provider]  insulin aspart (NOVOLOG) 100 UNIT/ML injection Inject 4 Units into the skin 3 (three) times daily with meals. 03/31/19  Yes Hall, Carole N, DO  insulin aspart protamine - aspart (NOVOLOG MIX 70/30 FLEXPEN) (70-30) 100 UNIT/ML FlexPen Inject 0.1 mLs (10 Units total) into the skin 2 (two) times daily. 03/31/19  Yes Hall, Carole N, DO  latanoprost  (XALATAN) 0.005 % ophthalmic solution Place 1 drop into both eyes at bedtime as needed (dry eyes).    Yes [provider]  metolazone (ZAROXOLYN) 2.5 MG tablet Take 1 tablet (2.5 mg total) by mouth once a week. Every Wednesday Patient taking differently: Take 2.5 mg by mouth once a week. Take on Wednesdays 08/28/20  Yes Larey Dresser, MD  oxybutynin (DITROPAN) 5 MG tablet Take 1 tablet (5 mg total) by mouth 4 (four) times daily. Patient taking differently: Take 5 mg by mouth 4 (four) times daily as needed for bladder spasms.  06/13/19  Yes Swayze, Ava, DO  oxyCODONE-acetaminophen (PERCOCET) 10-325 MG tablet Take 1 tablet by mouth every 6 (six) hours as needed for pain.  05/16/19  Yes [provider]  pantoprazole (PROTONIX) 40 MG tablet Take 40 mg by mouth 2 (two) times daily as needed (heartburn).    Yes [provider]  polyethylene glycol (MIRALAX / GLYCOLAX) 17 g packet Take 17 g by mouth daily as needed. Patient taking differently: Take 17 g by mouth daily as needed for mild constipation.  08/29/19  Yes Hosie Poisson, MD  potassium chloride SA (KLOR-CON) 20 MEQ tablet Take 4 tablets (80 mEq total) by mouth daily. With additional 39mq when you take Metolazone. Patient taking differently: Take 20-60 mEq by mouth See admin instructions. 637m twice daily and an additional 2053mwhen taking metolazone on Wednesdays 07/27/20  Yes McLLarey DresserD  rosuvastatin (CRESTOR) 40 MG tablet Take 20 mg by mouth daily.   Yes [provider]  senna-docusate (SENOKOT-S) 8.6-50 MG tablet Take 1 tablet by mouth at bedtime as needed for mild constipation. 08/29/19  Yes AkuHosie PoissonD  sodium chloride (OCEAN) 0.65 % SOLN nasal spray Place 2 sprays into both nostrils as  needed for congestion.    Yes [provider]  tamsulosin (FLOMAX) 0.4 MG CAPS capsule Take 0.4 mg by mouth daily as needed (urination).    Yes [provider]  terbinafine (LAMISIL) 1 %  cream Apply 1 application topically daily. To groin for 4 weeks daily with zinc oxide over it   Yes [provider]  torsemide (DEMADEX) 20 MG tablet Take 4 tablets (80 mg total) by mouth 2 (two) times daily. 11/18/19  Yes Larey Dresser, MD  zinc oxide 20 % ointment Apply 1 application topically 2 (two) times daily as needed for irritation (rash).    Yes [provider]  apixaban (ELIQUIS) 5 MG TABS tablet Take 1 tablet (5 mg total) by mouth 2 (two) times daily. Patient taking differently: Take 2.5 mg by mouth 2 (two) times daily.  08/29/19   Hosie Poisson, MD  Insulin Syringe 27G X 1/2" 0.5 ML MISC 100 Syringes by Does not apply route 3 (three) times daily. 08/29/19   Hosie Poisson, MD  OXYGEN Inhale 3 L into the lungs continuous.     [provider]  predniSONE (DELTASONE) 10 MG tablet Take 4 each am x 2 days,   2 each am x 2 days,  1 each am x 2 days and stop Patient not taking: Reported on 09/26/2020 09/19/20   Tanda Rockers, MD  predniSONE (DELTASONE) 10 MG tablet Take 4 tablets (40 mg total) by mouth daily with breakfast for 2 days, THEN 2 tablets (20 mg total) daily with breakfast for 2 days, THEN 1 tablet (10 mg total) daily with breakfast for 2 days. Patient not taking: Reported on 09/26/2020 09/25/20 10/01/20  Tanda Rockers, MD  PRESCRIPTION MEDICATION Cpap    [provider]

## 2020-10-02 DIAGNOSIS — I5043 Acute on chronic combined systolic (congestive) and diastolic (congestive) heart failure: Secondary | ICD-10-CM | POA: Diagnosis not present

## 2020-10-02 DIAGNOSIS — J9621 Acute and chronic respiratory failure with hypoxia: Secondary | ICD-10-CM | POA: Diagnosis not present

## 2020-10-02 DIAGNOSIS — I5021 Acute systolic (congestive) heart failure: Secondary | ICD-10-CM | POA: Diagnosis not present

## 2020-10-02 DIAGNOSIS — G9340 Encephalopathy, unspecified: Secondary | ICD-10-CM | POA: Diagnosis not present

## 2020-10-02 DIAGNOSIS — J9601 Acute respiratory failure with hypoxia: Secondary | ICD-10-CM | POA: Diagnosis not present

## 2020-10-02 LAB — BASIC METABOLIC PANEL
Anion gap: 19 — ABNORMAL HIGH (ref 5–15)
Anion gap: 19 — ABNORMAL HIGH (ref 5–15)
BUN: 67 mg/dL — ABNORMAL HIGH (ref 8–23)
BUN: 69 mg/dL — ABNORMAL HIGH (ref 8–23)
CO2: 40 mmol/L — ABNORMAL HIGH (ref 22–32)
CO2: 39 mmol/L — ABNORMAL HIGH (ref 22–32)
Calcium: 8.2 mg/dL — ABNORMAL LOW (ref 8.9–10.3)
Calcium: 8.1 mg/dL — ABNORMAL LOW (ref 8.9–10.3)
Chloride: 87 mmol/L — ABNORMAL LOW (ref 98–111)
Chloride: 84 mmol/L — ABNORMAL LOW (ref 98–111)
Creatinine, Ser: 3.11 mg/dL — ABNORMAL HIGH (ref 0.61–1.24)
Creatinine, Ser: 3.06 mg/dL — ABNORMAL HIGH (ref 0.61–1.24)
GFR, Estimated: 20 mL/min — ABNORMAL LOW (ref >60)
GFR, Estimated: 21 mL/min — ABNORMAL LOW (ref >60)
Glucose, Bld: 120 mg/dL — ABNORMAL HIGH (ref 70–99)
Glucose, Bld: 132 mg/dL — ABNORMAL HIGH (ref 70–99)
Potassium: 2.2 mmol/L — CL (ref 3.5–5.1)
Potassium: 2.2 mmol/L — CL (ref 3.5–5.1)
Sodium: 146 mmol/L — ABNORMAL HIGH (ref 135–145)
Sodium: 142 mmol/L (ref 135–145)

## 2020-10-02 LAB — GLUCOSE, CAPILLARY
Glucose-Capillary: 117 mg/dL — ABNORMAL HIGH (ref 70–99)
Glucose-Capillary: 122 mg/dL — ABNORMAL HIGH (ref 70–99)
Glucose-Capillary: 126 mg/dL — ABNORMAL HIGH (ref 70–99)
Glucose-Capillary: 127 mg/dL — ABNORMAL HIGH (ref 70–99)
Glucose-Capillary: 145 mg/dL — ABNORMAL HIGH (ref 70–99)
Glucose-Capillary: 159 mg/dL — ABNORMAL HIGH (ref 70–99)
Glucose-Capillary: 180 mg/dL — ABNORMAL HIGH (ref 70–99)

## 2020-10-02 LAB — MAGNESIUM: Magnesium: 2 mg/dL (ref 1.7–2.4)

## 2020-10-02 MED ORDER — METOLAZONE 2.5 MG PO TABS
5.0000 mg | ORAL_TABLET | Freq: Two times a day (BID) | ORAL | Status: DC
  Administered 2020-10-03: 5 mg via ORAL
  Filled 2020-10-02: qty 2

## 2020-10-02 MED ORDER — POTASSIUM CHLORIDE 20 MEQ/15ML (10%) PO SOLN
40.0000 meq | ORAL | Status: DC
  Administered 2020-10-02 – 2020-10-03 (×4): 40 meq via ORAL
  Filled 2020-10-02 (×6): qty 30

## 2020-10-02 MED ORDER — ONDANSETRON HCL 4 MG/2ML IJ SOLN
4.0000 mg | Freq: Four times a day (QID) | INTRAMUSCULAR | Status: DC | PRN
  Administered 2020-10-04 – 2020-10-05 (×2): 4 mg via INTRAVENOUS
  Filled 2020-10-02 (×2): qty 2

## 2020-10-02 MED ORDER — POTASSIUM CHLORIDE 10 MEQ/100ML IV SOLN
10.0000 meq | INTRAVENOUS | Status: AC
  Administered 2020-10-02 (×6): 10 meq via INTRAVENOUS
  Filled 2020-10-02 (×6): qty 100

## 2020-10-02 NOTE — Progress Notes (Addendum)
Call from lab, patient noon lab work with K+ of 2.2 Dr Benny Lennert sent a text message to call RN  Orders received

## 2020-10-02 NOTE — Plan of Care (Signed)
  Problem: Education: Goal: Knowledge of General Education information will improve Description: Including pain rating scale, medication(s)/side effects and non-pharmacologic comfort measures Outcome: Progressing

## 2020-10-02 NOTE — Progress Notes (Signed)
Physical Therapy Treatment Patient Details Name: Nicholas Caldwell MRN: [id number] DOB: 1946/06/15 Today's Date: 10/02/2020    History of Present Illness Pt is a 74 y.o. male with medical history of NISCM, systolic CHF and mod RV failure, DM2, CKD 4, PAF s/p DCCV, COPD on home O2, OHS/ OSA on 3 L O2 and CPAP & CAD.  Pt admitted with acute on chronic hypoxic and hypercapneic resp failure requiring bipap.    PT Comments    Pt agreeable to therapy today, but puts forth minimal effort. Difficult to assess whether due to poor cognition or decreased desire. Pt responds to questions with grunting. Pt requires max-total A for bed mobility. Pt able to initiate LE management with increased multimodal cuing however unable to maintain focus to perform task at hand. Once in seated pt requires assist to maintain balance especially with movement. Pt extremely stiff in trunk attempted to stretch shoulders and thoracic spine to facility increased movement. D/c plans remain appropriate at this time. PT will continue to follow acutely in attempt to progress mobility.     Follow Up Recommendations  SNF     Equipment Recommendations  Other (comment) (hoyer if home but needs further assessment next venue)       Precautions / Restrictions Precautions Precautions: Fall Precaution Comments: O2 sats Restrictions Weight Bearing Restrictions: No    Mobility  Bed Mobility Overal bed mobility: Needs Assistance Bed Mobility: Supine to Sit;Sit to Supine;Rolling Rolling: Total assist;+2 for physical assistance;+2 for safety/equipment   Supine to sit: Total assist;+2 for physical assistance;+2 for safety/equipment Sit to supine: Total assist;+2 for physical assistance;+2 for safety/equipment   General bed mobility comments: Difficulty following all commands for motor planning, max multi modal cues to attempt to bring legs off of bed, but less than 25% participation  Transfers                 General  transfer comment: Deferred due to lack of participation and total A to come to EOB     Balance Overall balance assessment: Needs assistance Sitting-balance support: Bilateral upper extremity supported Sitting balance-Leahy Scale: Poor Sitting balance - Comments: Required assist at trunk to remain upright, but able to bring hands into lap and shift anteriorly and posteriorly with mod-max A from therapist's to promote trunk engagement                                    Cognition Arousal/Alertness: Lethargic Behavior During Therapy: Flat affect Overall Cognitive Status: Difficult to assess                                 General Comments: Pt either grunting to respond or providing one word answers, extremely self limiting during session, making it difficult to assess cognition      Exercises Other Exercises Other Exercises: AAROM forward flexion, thoracic rotation, reaching across body  Other Exercises: PROM thoracic spine, shoulder retraction     General Comments General comments (skin integrity, edema, etc.): VSS      Pertinent Vitals/Pain Pain Assessment: Faces Faces Pain Scale: Hurts a little bit Pain Location: R hip Pain Descriptors / Indicators: Discomfort Pain Intervention(s): Limited activity within patient's tolerance;Monitored during session;Repositioned           PT Goals (current goals can now be found in the care plan section) Acute Rehab PT Goals  Patient Stated Goal: None stated PT Goal Formulation: With patient/family Time For Goal Achievement: 10/12/20 Potential to Achieve Goals: Fair Progress towards PT goals: Not progressing toward goals - comment (poor participation )    Frequency    Min 2X/week      PT Plan Current plan remains appropriate    Co-evaluation PT/OT/SLP Co-Evaluation/Treatment: Yes Reason for Co-Treatment: Necessary to address cognition/behavior during functional activity PT goals addressed during  session: Mobility/safety with mobility OT goals addressed during session: ADL's and self-care;Strengthening/ROM      AM-PAC PT "6 Clicks" Mobility   Outcome Measure  Help needed turning from your back to your side while in a flat bed without using bedrails?: A Lot Help needed moving from lying on your back to sitting on the side of a flat bed without using bedrails?: Total Help needed moving to and from a bed to a chair (including a wheelchair)?: Total Help needed standing up from a chair using your arms (e.g., wheelchair or bedside chair)?: Total Help needed to walk in hospital room?: Total Help needed climbing 3-5 steps with a railing? : Total 6 Click Score: 7    End of Session Equipment Utilized During Treatment: Oxygen Activity Tolerance: Patient limited by fatigue Patient left: in bed;with call bell/phone within reach;with bed alarm set;with family/visitor present Nurse Communication: Mobility status PT Visit Diagnosis: Other abnormalities of gait and mobility (R26.89);Muscle weakness (generalized) (M62.81)     Time: 7356-7014 PT Time Calculation (min) (ACUTE ONLY): 26 min  Charges:  $Therapeutic Exercise: 8-22 mins                     Kadeisha Betsch B. Migdalia Dk PT, DPT Acute Rehabilitation Services Pager [phone] Office [phone]    [place] 10/02/2020, 4:53 PM

## 2020-10-02 NOTE — Progress Notes (Signed)
Occupational Therapy Treatment Patient Details Name: Rondey Fallen MRN: [id number] DOB: 1946-07-29 Today's Date: 10/02/2020    History of present illness Pt is a 74 y.o. male with medical history of NISCM, systolic CHF and mod RV failure, DM2, CKD 4, PAF s/p DCCV, COPD on home O2, OHS/ OSA on 3 L O2 and CPAP & CAD.  Pt admitted with acute on chronic hypoxic and hypercapneic resp failure requiring bipap.   OT comments  Patient making minimal progress towards goals in skilled OT session. Patient's session encompassed co-treat with PT in order to assess functional mobility and cognition. Pt with significant decline in participation with cognition extremely difficult as pt would either grunt, shrug shoulders, or provide one word answers to all prompts. Pt requiring max-total A to come to edge of bed, with multi-modal cues and extra time to complete all prompts. Pt refusing to attempt to reach outside base of support when sitting EOB (PT behind patient providing support for safety) and requiring max A in order to rock anteriorly and posteriorly to engage trunk. Pt also noted with significant tightness at neck and shoulders, with need of mod A in order to bring head and neck into neutral versus forward flexion. Pt able to tolerate less than 15 minutes of supported sitting EOB, and placed in chair position at end of session to promote increased trunk engagement. Discharge remains appropriate, therapy to continue to follow.   Follow Up Recommendations  SNF    Equipment Recommendations  Other (comment) (Has needed DME)    Recommendations for Other Services Other (comment) (Palliative Consult)    Precautions / Restrictions Precautions Precautions: Fall Precaution Comments: O2 sats Restrictions Weight Bearing Restrictions: No       Mobility Bed Mobility Overal bed mobility: Needs Assistance Bed Mobility: Supine to Sit;Sit to Supine;Rolling Rolling: Total assist;+2 for physical assistance;+2 for  safety/equipment   Supine to sit: Total assist;+2 for physical assistance;+2 for safety/equipment Sit to supine: Total assist;+2 for physical assistance;+2 for safety/equipment   General bed mobility comments: Difficulty following all commands for motor planning, max multi modal cues to attempt to bring legs off of bed, but less than 25% participation  Transfers                 General transfer comment: Deferred due to lack of participation and total A to come to EOB    Balance Overall balance assessment: Needs assistance Sitting-balance support: Bilateral upper extremity supported Sitting balance-Leahy Scale: Poor Sitting balance - Comments: Required assist at trunk to remain upright, but able to bring hands into lap and shift anteriorly and posteriorly with mod-max A from therapist's to promote trunk engagement                                   ADL either performed or assessed with clinical judgement   ADL Overall ADL's : Needs assistance/impaired                 Upper Body Dressing : Total assistance;Bed level   Lower Body Dressing: Total assistance;Bed level   Toilet Transfer: Total assistance           Functional mobility during ADLs: Total assistance;+2 for physical assistance;+2 for safety/equipment General ADL Comments: Pt total A for all activities, requiring max encouragment to participate and hand over hand assist to attempt to reach outside BOS on EOB     Vision  Perception     Praxis      Cognition Arousal/Alertness: Lethargic Behavior During Therapy: Flat affect Overall Cognitive Status: Difficult to assess                                 General Comments: Pt either grunting to respond or providing one word answers, extremely self limiting during session, making it difficult to assess cognition        Exercises     Shoulder Instructions       General Comments      Pertinent Vitals/ Pain        Pain Assessment: Faces Faces Pain Scale: Hurts a little bit Pain Location: R hip Pain Descriptors / Indicators: Discomfort Pain Intervention(s): Limited activity within patient's tolerance;Monitored during session;Repositioned  Home Living                                          Prior Functioning/Environment              Frequency  Min 2X/week        Progress Toward Goals  OT Goals(current goals can now be found in the care plan section)  Progress towards OT goals: Not progressing toward goals - comment (Minimal to no participation)  Acute Rehab OT Goals Patient Stated Goal: None stated OT Goal Formulation: Patient unable to participate in goal setting Time For Goal Achievement: 10/12/20 Potential to Achieve Goals: Poor  Plan Discharge plan remains appropriate    Co-evaluation    PT/OT/SLP Co-Evaluation/Treatment: Yes Reason for Co-Treatment: Necessary to address cognition/behavior during functional activity;Complexity of the patient's impairments (multi-system involvement);For patient/therapist safety PT goals addressed during session: Mobility/safety with mobility;Balance;Strengthening/ROM OT goals addressed during session: ADL's and self-care;Strengthening/ROM      AM-PAC OT "6 Clicks" Daily Activity     Outcome Measure   Help from another person eating meals?: A Lot Help from another person taking care of personal grooming?: A Lot Help from another person toileting, which includes using toliet, bedpan, or urinal?: Total Help from another person bathing (including washing, rinsing, drying)?: A Lot Help from another person to put on and taking off regular upper body clothing?: A Lot Help from another person to put on and taking off regular lower body clothing?: Total 6 Click Score: 10    End of Session Equipment Utilized During Treatment: Oxygen  OT Visit Diagnosis: Muscle weakness (generalized) (M62.81);Other abnormalities of gait and  mobility (R26.89)   Activity Tolerance Patient limited by fatigue;Other (comment) (Minimal participation)   Patient Left in bed;with call bell/phone within reach;with family/visitor present   Nurse Communication Mobility status        Time: 8599-2341 OT Time Calculation (min): 26 min  Charges: OT General Charges $OT Visit: 1 Visit OT Treatments $Self Care/Home Management : 8-22 mins  Corinne Ports E. Rasheena Talmadge, COTA/L Acute Rehabilitation Services [phone] [place] 10/02/2020, 3:12 PM

## 2020-10-02 NOTE — Progress Notes (Signed)
NAME:  Nicholas Caldwell, MRN:  [id number], DOB:  07-07-1946, LOS: 7 ADMISSION DATE:  09/25/2020, CONSULTATION DATE:  11/29 REFERRING MD:  Karie Kirks  CHIEF COMPLAINT:  Acute on chronic respiratory failure  Brief History   74 year old male with history of COPD and right-sided congestive heart failure who is being managed for acute on chronic hypoxemic/hypercapnic respiratory failure.  Of note, on 3-4 L [place] at home.   History of present illness   Patient follows with Dr Melvyn Novas as outpatient.  5 days prior to presentation on 11/23, patient was given steroids with no improvement and therefore sought further treatment in the ED.  He was found to have AKI and requiring Bipap for respiratory failure. BNP 3275.  He was aggressively diuresed and able to be liberated from [place]. Last seen by PCCM on 11/24.  Patient has had continued diuresis this admission with net negative fluid balance 8 L.  Most recent CXR on 11/23 with persistent pulmonary edema.  On exam, he has continued lower extremity edema and abdominal distention which is likely due to volume.  He is remained afebrile with no leukocytosis.   Past Medical History  Heart failure pAfib on Eliquis DM, type II PEA arrest in 2020 Normocytic anemia Chronic pain Morbid obesity  Significant Hospital Events   11/23 Presented to ED  Consults:  Pulmonary Critical Care   Procedures:    Significant Diagnostic Tests:  ECHO 11/24:  LVEF 50-55%, RV is severely reduced with RVSP: 68.  Right atrial severely dilated.  Trivial mitral regurgitation. Tricuspid valve regurgitation is moderate to severe.  Aortic valve not visualized.   Micro Data:    Antimicrobials:    Interim history/subjective:  Feels better today, less dyspnea.  Objective   Blood pressure (!) 134/108, pulse 88, temperature 98.7 F (37.1 C), temperature source Oral, resp. rate (!) 21, height 6' (1.829 m), weight 106.3 kg, SpO2 93 %.        Intake/Output Summary (Last 24 hours) at  10/02/2020 1743 Last data filed at 10/02/2020 1500 Gross per 24 hour  Intake 714.41 ml  Output 2700 ml  Net -1985.59 ml   Filed Weights   09/30/20 0300 10/01/20 0331 10/02/20 0449  Weight: 111.3 kg 110.8 kg 106.3 kg    Examination: General: brighter and more interactive. HENT: [place]/AT Lungs: diminished at bases, symmetric expansion, no increased work of breathing  Cardiovascular: S1/S1. No murmur. + lower extremity edema, warm and well perfused Abdomen: distended, soft, + bowel sounds Extremities: no deformities or rashes Neuro:answers questions with prompting, follows commands in all extremities.   Resolved Hospital Problem list     Assessment & Plan:  Acute on chronic hypercarbic/hypoxic respiratory failure in setting of COPD and RV dysfunction -No evidence of COPD, AE. Persistent hypercarbia multifactorial with volume overload and deconditioning. Suspect he has ongoing volume removal needed.  He continues to have edema and abdominal distention c/w volume overload.  Repeat BNP.  Add metolazone to current lasix dose. Change NRB to simple mask or [place]. Goal SpO2 88-90%.  Continue to wear Bipap at night and when sleeping. PT/OT.  Would also recommend limiting sedating medications and reducing valium if possible.   Hypokalemia -replace potassium.  Check Mg and Phos.  - Consider holding metolazone this evening to allow potassium repletion to catch up.  CKD,  -Baseline creatinine 2.9-3.5. Creatinine has been stable with diuresis and negative fluid balance, continue diuresis.   Pulmonary will continue to follow.  Labs   CBC: Recent Labs  Lab  09/25/20 1837 09/27/20 0333 09/28/20 0023 09/30/20 0025  WBC  --  8.1 6.5 6.6  NEUTROABS  --  4.7 4.6 5.3  HGB 12.2* 10.3* 10.3* 10.9*  HCT 36.0* 35.3* 36.0* 36.8*  MCV  --  88.7 89.6 86.2  PLT  --  319 312 774    Basic Metabolic Panel: Recent Labs  Lab 09/29/20 0103 09/29/20 0103 09/29/20 1416 09/29/20 2100 09/30/20 0025  10/01/20 1609 10/02/20 0036 10/02/20 1219  NA 143   < > 146* 145 144  --  146* 142  K 2.9*   < > 2.6* 4.2 3.6  --  2.2* 2.2*  CL 95*   < > 93* 94* 95*  --  87* 84*  CO2 32   < > 36* 34* 33*  --  40* 39*  GLUCOSE 130*   < > 141* 133* 143*  --  120* 132*  BUN 61*   < > 56* 58* 57*  --  67* 69*  CREATININE 3.95*   < > 3.80* 3.63* 3.69*  --  3.11* 3.06*  CALCIUM 8.1*   < > 8.3* 8.2* 8.1*  --  8.2* 8.1*  MG 2.3  --   --   --   --  2.0  --  2.0  PHOS  --   --   --   --   --  3.5  --   --    < > = values in this interval not displayed.   GFR: Estimated Creatinine Clearance: 27.5 mL/min (A) (by C-G formula based on SCr of 3.06 mg/dL (H)). Recent Labs  Lab 09/26/20 1343 09/27/20 0333 09/28/20 0023 09/30/20 0025  PROCALCITON 0.31 0.33 0.33  --   WBC  --  8.1 6.5 6.6    Liver Function Tests: No results for input(s): AST, ALT, ALKPHOS, BILITOT, PROT, ALBUMIN in the last 168 hours. No results for input(s): LIPASE, AMYLASE in the last 168 hours. No results for input(s): AMMONIA in the last 168 hours.  ABG    Component Value Date/Time   PHART 7.349 (L) 09/29/2020 0859   PCO2ART 63.9 (H) 09/29/2020 0859   PO2ART 69.1 (L) 09/29/2020 0859   HCO3 34.4 (H) 09/29/2020 0859   TCO2 29 09/25/2020 1837   ACIDBASEDEF 0.4 09/26/2020 0618   O2SAT 92.8 09/29/2020 0859     Coagulation Profile: No results for input(s): INR, PROTIME in the last 168 hours.  Cardiac Enzymes: No results for input(s): CKTOTAL, CKMB, CKMBINDEX, TROPONINI in the last 168 hours.  HbA1C: Hgb A1c MFr Bld  Date/Time Value Ref Range Status  09/25/2020 10:03 PM 6.7 (H) 4.8 - 5.6 % Final    Comment:    (NOTE) Pre diabetes:          5.7%-6.4%  Diabetes:              >6.4%  Glycemic control for   <7.0% adults with diabetes   08/21/2019 12:17 AM 7.4 (H) 4.8 - 5.6 % Final    Comment:    (NOTE)         Prediabetes: 5.7 - 6.4         Diabetes: >6.4         Glycemic control for adults with diabetes: <7.0      CBG: Recent Labs  Lab 10/02/20 0056 10/02/20 0442 10/02/20 0740 10/02/20 1156 10/02/20 1621  GLUCAP 127* 117* 126* 159* [place]   [name], MD [facility] ICU Physician [place]  Pager: [phone] Or Epic  Secure Chat After hours: [phone].  10/02/2020, 5:45 PM

## 2020-10-02 NOTE — Progress Notes (Signed)
PROGRESS NOTE  Reeve Turnley SHN:[id number] DOB: 03-10-1946 DOA: 09/25/2020 PCP: [name], MD  Brief History   Chief Complaint: brought in for shortness of breath  HPI: [place] W[place]lick is a 74 y.o. male with medical history of NISCM, systolic CHF and mod RV failure, DM2, CKD 4, PAF s/p DCCV, COPD on home O2, OHS/ OSA on 3 L O2 and CPAP & CAD.  The patient is currently sleepy and unable to give me a detailed history. He knows he is at [facility] and know his age but will not tell me why he is in the hospital and falls back to sleep. Per ED notes, he has been more short of breath for about 1 wk and has more fluid on his legs.   Pulse ox was 86% on 4 L. Stayed in 80s on 6 L. Subsequently placed on a non re-breather- pulse ox has been 100%. Given 40 mg IV Lasix. CXR reveals pulmonary edema.   Pt is intermittently non-compliant with BIPAP and O2. He continues to require BIPAP with 70% O2 or NRB with 10 L O2 to maintain SaO2 in the 90's. Consultants  . Pulmonology  Procedures  . None  Antibiotics   Anti-infectives (From admission, onward)   None     Subjective  The patient is lying in bed. No new complaints.  Objective   Vitals:  Vitals:   10/02/20 0700 10/02/20 1200  BP: 124/69 119/70  Pulse: 78 79  Resp: (!) 24 (!) 22  Temp: 99.5 F (37.5 C)   SpO2: (!) 89% 90%   Exam:  Constitutional:  . The patient is awake, alert, and oriented x 3. No acute distress. Respiratory:  . No increased work of breathing. . No wheezes, rales, or rhonchi . Diminished breath sounds bilaterally . No tactile fremitus Cardiovascular:  . Regular rate and rhythm . No murmurs, ectopy, or gallups. . No lateral PMI. No thrills. Abdomen:  . Abdomen is soft, non-tender, non-distended . No hernias, masses, or organomegaly . Normoactive bowel sounds.  Musculoskeletal:  . No cyanosis, clubbing, or edema Skin:  . No rashes, lesions, ulcers . palpation of skin: no induration or  nodules Neurologic:  . CN 2-12 intact . Sensation all 4 extremities intact Psychiatric:  . Mental status o Mood, affect appropriate o Orientation to person, place, time  . judgment and insight appear intact  I have personally reviewed the following:   Today's Data  . Vitals, CBC, BMP  Imaging  . CXR  Scheduled Meds: . amiodarone  100 mg Oral Daily  . apixaban  5 mg Oral BID  . diazepam  2 mg Oral TID  . insulin aspart  0-9 Units Subcutaneous Q4H  . metolazone  5 mg Oral BID  . sodium chloride flush  3 mL Intravenous Q12H   Continuous Infusions: . sodium chloride Stopped (10/02/20 0321)  . furosemide 120 mg (10/02/20 0556)    Principal Problem:   Acute respiratory failure (HCC) Active Problems:   Essential hypertension   Type 2 diabetes mellitus with neuropathy   Morbid obesity (HCC)   PAF (paroxysmal atrial fibrillation) (HCC)   COPD (chronic obstructive pulmonary disease) (HCC)   Acute on chronic combined systolic and diastolic CHF (congestive heart failure) (HCC)   OSA (obstructive sleep apnea)   Acute encephalopathy   Acute respiratory failure with hypoxia and hypercapnia (HCC)   Acute systolic CHF (congestive heart failure) (HCC)   Stage III pressure ulcer of sacral region ([place])   LOS: 7 days  A & P  Acute on chronic hypoxic and hypercapneic respiratory failure: The patient was admitted with respiratory acidosis and hypoxia that required BIPAP. Repeat ABG with the patient on BIPAP demonstrated somewhat improved, but continued respiratory acidosis and hypercarbia. Pulmonology was consulted and adjustments were made to the BIPAP settings. Will continue to monitor. Per pulmonology the patient need only wear BIPAP at night. He has been intermittently refusing to wear his O2 or BIPAP. He is currently requiring 15 L by [place] to maintain SaO2 in the 90's. I have considered CTA chest to rule out PE, but cannot due to creatinine of 3.69. Due to patient's high O2 requirements  I doubt that he could tolerate VQ scan. I have asked pulmonology to come back to re-evaluate.  Acute on chronic combined systolic and diastolic CHF (congestive heart   failure) with severe RV systolic failure and dilatation: The patient is on 3-4 L of O2 at home. He presented with pulmonary edema, pedal edema and BNP elevation of 3275. He is receiving diuresis with lasix 120 mg tid. As last echocardiogram was performed in 08/2019, it was repeated. Today's study report is pending. I's and O's are being following closely. He is currently negative 10.234 liters in terms of fluid balance. Continue to monitor.  Severe hypokalemia: Supplement and monitor.  Hypomagnesemia: Supplement and monitor.  Acute encephalopathy: Resolved. Due to hypercarbia and possibly Neurontin which has been held. Improved mental status, although still with hypercarbia.   Diarrhea: 2 stools in last 24 hours.  CKD 4: Creatinine/GFR are worsening slightly every day. Today's creatinine is 3.11. Monitor creatinine, electrolytes, and monitor volume status.   Mildly elevated Troponin: Likely due to cardiac strain in setting of severe renal disease   Type 2 diabetes mellitus with neuropathy: HBA1c of 09/25/2020 was 6.7. Glucoses have run from 116-159 in the last 24 hours.  Morbid obesity: This complicates all cares. Body mass index is 35.26 kg/m.  PAF (paroxysmal atrial fibrillation): Sinus rhythm per EKG 09/25/2020. Monitor on telemetry. Amiodarone and Apixaban to be continued. Rate is well controlled.   Chronic pain: Narcotics are held as them may depress respiration and worsen respiratory acidosis and hypercarbia. He is receiving IV acetaminophen. I will start the patient on a low dose of hydrocodone due to his severe hip pain. Will avoid restarting his 10/325 mg percocets as he takes at time out of concern for respiratory depression and worsening acidosis.   Normocytic anemia: Hb has ranged from 8.8-11 in the past  year. Hemoglobin is stable at 10.9.  H/o PEA arrest in 10/20: Noted. Monitor on telemetry.  I have seen and examined this patient myself. I have spent 38 minutes in his evaluation and care.   DVT prophylaxis: Eliquis  Code Status: Full code  Family Communication: I have discussed this patient in detail with his wife. All questions answered to the best of my ability Disposition Plan:  Status is: Inpatient  Remains inpatient appropriate because:Inpatient level of care appropriate due to severity of illness  Dispo: The patient is from: Home              Anticipated d/c is to: SNF              Anticipated d/c date is: 3 days              Patient currently is not medically stable to d/c.  Petula Rotolo, DO [facility] Direct contact: see [email or web address]  7PM-7AM contact night coverage as above 10/02/2020, 12:48 PM  LOS: 1 day

## 2020-10-02 NOTE — Progress Notes (Signed)
Call from infection control.  Unnecessary for patient to be on enteric precautions unless dealing with incontinence.  Patient with a flexiseal.  Suggest to leave the precautions in place for dealing with flexiseal

## 2020-10-02 NOTE — Plan of Care (Signed)

## 2020-10-03 ENCOUNTER — Inpatient Hospital Stay (HOSPITAL_COMMUNITY): Payer: No Typology Code available for payment source

## 2020-10-03 DIAGNOSIS — G9340 Encephalopathy, unspecified: Secondary | ICD-10-CM | POA: Diagnosis not present

## 2020-10-03 DIAGNOSIS — Z515 Encounter for palliative care: Secondary | ICD-10-CM | POA: Diagnosis not present

## 2020-10-03 DIAGNOSIS — J9601 Acute respiratory failure with hypoxia: Secondary | ICD-10-CM | POA: Diagnosis not present

## 2020-10-03 DIAGNOSIS — I5021 Acute systolic (congestive) heart failure: Secondary | ICD-10-CM | POA: Diagnosis not present

## 2020-10-03 DIAGNOSIS — J42 Unspecified chronic bronchitis: Secondary | ICD-10-CM | POA: Diagnosis not present

## 2020-10-03 DIAGNOSIS — I5043 Acute on chronic combined systolic (congestive) and diastolic (congestive) heart failure: Secondary | ICD-10-CM | POA: Diagnosis not present

## 2020-10-03 LAB — GLUCOSE, CAPILLARY
Glucose-Capillary: 119 mg/dL — ABNORMAL HIGH (ref 70–99)
Glucose-Capillary: 120 mg/dL — ABNORMAL HIGH (ref 70–99)
Glucose-Capillary: 126 mg/dL — ABNORMAL HIGH (ref 70–99)
Glucose-Capillary: 127 mg/dL — ABNORMAL HIGH (ref 70–99)
Glucose-Capillary: 137 mg/dL — ABNORMAL HIGH (ref 70–99)
Glucose-Capillary: 150 mg/dL — ABNORMAL HIGH (ref 70–99)

## 2020-10-03 LAB — BASIC METABOLIC PANEL
Anion gap: 20 — ABNORMAL HIGH (ref 5–15)
Anion gap: 20 — ABNORMAL HIGH (ref 5–15)
BUN: 75 mg/dL — ABNORMAL HIGH (ref 8–23)
BUN: 85 mg/dL — ABNORMAL HIGH (ref 8–23)
CO2: 41 mmol/L — ABNORMAL HIGH (ref 22–32)
CO2: 40 mmol/L — ABNORMAL HIGH (ref 22–32)
Calcium: 8.9 mg/dL (ref 8.9–10.3)
Calcium: 9 mg/dL (ref 8.9–10.3)
Chloride: 84 mmol/L — ABNORMAL LOW (ref 98–111)
Chloride: 84 mmol/L — ABNORMAL LOW (ref 98–111)
Creatinine, Ser: 2.97 mg/dL — ABNORMAL HIGH (ref 0.61–1.24)
Creatinine, Ser: 3.25 mg/dL — ABNORMAL HIGH (ref 0.61–1.24)
GFR, Estimated: 22 mL/min — ABNORMAL LOW (ref >60)
GFR, Estimated: 19 mL/min — ABNORMAL LOW (ref >60)
Glucose, Bld: 125 mg/dL — ABNORMAL HIGH (ref 70–99)
Glucose, Bld: 141 mg/dL — ABNORMAL HIGH (ref 70–99)
Potassium: 2.8 mmol/L — ABNORMAL LOW (ref 3.5–5.1)
Potassium: 3.4 mmol/L — ABNORMAL LOW (ref 3.5–5.1)
Sodium: 145 mmol/L (ref 135–145)
Sodium: 144 mmol/L (ref 135–145)

## 2020-10-03 MED ORDER — FUROSEMIDE 10 MG/ML IJ SOLN
120.0000 mg | Freq: Two times a day (BID) | INTRAVENOUS | Status: DC
  Administered 2020-10-03 – 2020-10-04 (×2): 120 mg via INTRAVENOUS
  Filled 2020-10-03 (×2): qty 12
  Filled 2020-10-03: qty 2
  Filled 2020-10-03: qty 12

## 2020-10-03 MED ORDER — METOLAZONE 2.5 MG PO TABS: 5.0000 mg | ORAL_TABLET | Freq: Every day | ORAL | Status: DC

## 2020-10-03 MED ORDER — POTASSIUM CHLORIDE 10 MEQ/100ML IV SOLN
10.0000 meq | INTRAVENOUS | Status: AC
  Administered 2020-10-03 (×6): 10 meq via INTRAVENOUS
  Filled 2020-10-03 (×6): qty 100

## 2020-10-03 MED ORDER — POTASSIUM CHLORIDE 20 MEQ PO PACK
40.0000 meq | PACK | ORAL | Status: AC
  Administered 2020-10-03 (×2): 40 meq via ORAL
  Filled 2020-10-03 (×2): qty 2

## 2020-10-03 NOTE — Progress Notes (Signed)
PROGRESS NOTE    Nicholas Caldwell  JOA:[id number] DOB: 1946-09-29 DOA: 09/25/2020 PCP: [name], MD    Brief Narrative:  Nicholas Caldwell a 74 y.o.malewith medical history ofNISCM, systolic CHF and mod RV failure, DM2, CKD 4, PAF s/p DCCV, COPD on home O2, OHS/ OSA on 3 L O2 and CPAP&CAD.  Per ED notes, he has been more short of breath for about 1 wk and has more fluid on his legs.  Pulse ox was 86% on 4 L. Stayed in 80s on 6 L. Subsequently placed on a non re-breather- pulse ox has been 100%. Given 40 mg IV Lasix. CXR reveals pulmonary edema.  Pt is intermittently non-compliant with BIPAP and O2. He continues to require BIPAP with 70% O2 or NRB with 10 L O2 to maintain SaO2 in the 90's.  12/1-RT checked on Pt, pt off bipap and back on HFNC Off note, pt on 3-4L [place] at home.   Consultants:   pccm  Procedures:   Antimicrobials:       Subjective: Family at bedside. Pt wants to lay more flat in bed to sleep. No complaints from him .  Objective: Vitals:   10/03/20 0906 10/03/20 1104 10/03/20 1105 10/03/20 1107  BP:      Pulse: 98 96 91 94  Resp: (!) 24 (!) 3 (!) 31 (!) 33  Temp:      TempSrc:      SpO2: 90% 92% 93% 94%  Weight:      Height:        Intake/Output Summary (Last 24 hours) at 10/03/2020 1313 Last data filed at 10/03/2020 0955 Gross per 24 hour  Intake 679.72 ml  Output 3300 ml  Net -2620.28 ml   Filed Weights   10/01/20 0331 10/02/20 0449 10/03/20 0500  Weight: 110.8 kg 106.3 kg 104 kg    Examination:  General exam: Appears calm and comfortable  Respiratory system: +scattered crackles b/l, no w. Cardiovascular system: S1 & S2 heard, RRR. No JVD, murmurs, rubs, gallops or clicks.  Gastrointestinal system: Abdomen is nondistended, soft and nontender.  Normal bowel sounds heard. Central nervous system: awake, grossly intact Extremities: mild edema b/l Skin: warm, dry Psychiatry:  Mood & affect appropriate in current setting.      Data Reviewed: I have personally reviewed following labs and imaging studies  CBC: Recent Labs  Lab 09/27/20 0333 09/28/20 0023 09/30/20 0025  WBC 8.1 6.5 6.6  NEUTROABS 4.7 4.6 5.3  HGB 10.3* 10.3* 10.9*  HCT 35.3* 36.0* 36.8*  MCV 88.7 89.6 86.2  PLT 319 312 601   Basic Metabolic Panel: Recent Labs  Lab 09/29/20 0103 09/29/20 1416 09/29/20 2100 09/30/20 0025 10/01/20 1609 10/02/20 0036 10/02/20 1219 10/03/20 0218  NA 143   < > 145 144  --  146* 142 145  K 2.9*   < > 4.2 3.6  --  2.2* 2.2* 2.8*  CL 95*   < > 94* 95*  --  87* 84* 84*  CO2 32   < > 34* 33*  --  40* 39* 41*  GLUCOSE 130*   < > 133* 143*  --  120* 132* 125*  BUN 61*   < > 58* 57*  --  67* 69* 75*  CREATININE 3.95*   < > 3.63* 3.69*  --  3.11* 3.06* 2.97*  CALCIUM 8.1*   < > 8.2* 8.1*  --  8.2* 8.1* 8.9  MG 2.3  --   --   --  2.0  --  2.0  --   PHOS  --   --   --   --  3.5  --   --   --    < > = values in this interval not displayed.   GFR: Estimated Creatinine Clearance: 28 mL/min (A) (by C-G formula based on SCr of 2.97 mg/dL (H)). Liver Function Tests: No results for input(s): AST, ALT, ALKPHOS, BILITOT, PROT, ALBUMIN in the last 168 hours. No results for input(s): LIPASE, AMYLASE in the last 168 hours. No results for input(s): AMMONIA in the last 168 hours. Coagulation Profile: No results for input(s): INR, PROTIME in the last 168 hours. Cardiac Enzymes: No results for input(s): CKTOTAL, CKMB, CKMBINDEX, TROPONINI in the last 168 hours. BNP (last 3 results) No results for input(s): PROBNP in the last 8760 hours. HbA1C: No results for input(s): HGBA1C in the last 72 hours. CBG: Recent Labs  Lab 10/02/20 1948 10/02/20 2308 10/03/20 0354 10/03/20 0829 10/03/20 1153  GLUCAP 145* 122* 119* 120* 127*   Lipid Profile: No results for input(s): CHOL, HDL, LDLCALC, TRIG, CHOLHDL, LDLDIRECT in the last 72 hours. Thyroid Function Tests: No results for input(s): TSH, T4TOTAL, FREET4,  T3FREE, THYROIDAB in the last 72 hours. Anemia Panel: No results for input(s): VITAMINB12, FOLATE, FERRITIN, TIBC, IRON, RETICCTPCT in the last 72 hours. Sepsis Labs: Recent Labs  Lab 09/26/20 1343 09/27/20 0333 09/28/20 0023  PROCALCITON 0.31 0.33 0.33    Recent Results (from the past 240 hour(s))  Respiratory Panel by RT PCR (Flu A&B, Covid) - Nasopharyngeal Swab     Status: None   Collection Time: 09/25/20  2:03 PM   Specimen: Nasopharyngeal Swab; Nasopharyngeal(NP) swabs in vial transport medium  Result Value Ref Range Status   SARS Coronavirus 2 by RT PCR NEGATIVE NEGATIVE Final    Comment: (NOTE) SARS-CoV-2 target nucleic acids are NOT DETECTED.  The SARS-CoV-2 RNA is generally detectable in upper respiratoy specimens during the acute phase of infection. The lowest concentration of SARS-CoV-2 viral copies this assay can detect is 131 copies/mL. A negative result does not preclude SARS-Cov-2 infection and should not be used as the sole basis for treatment or other patient management decisions. A negative result may occur with  improper specimen collection/handling, submission of specimen other than nasopharyngeal swab, presence of viral mutation(s) within the areas targeted by this assay, and inadequate number of viral copies (<131 copies/mL). A negative result must be combined with clinical observations, patient history, and epidemiological information. The expected result is Negative.  Fact Sheet for Patients:  PinkCheek.be  Fact Sheet for Healthcare Providers:  GravelBags.it  This test is no t yet approved or cleared by the [place] FDA and  has been authorized for detection and/or diagnosis of SARS-CoV-2 by FDA under an Emergency Use Authorization (EUA). This EUA will remain  in effect (meaning this test can be used) for the duration of the COVID-19 declaration under Section 564(b)(1) of the Act, 21  U.S.C. section 360bbb-3(b)(1), unless the authorization is terminated or revoked sooner.     Influenza A by PCR NEGATIVE NEGATIVE Final   Influenza B by PCR NEGATIVE NEGATIVE Final    Comment: (NOTE) The Xpert Xpress SARS-CoV-2/FLU/RSV assay is intended as an aid in  the diagnosis of influenza from Nasopharyngeal swab specimens and  should not be used as a sole basis for treatment. Nasal washings and  aspirates are unacceptable for Xpert Xpress SARS-CoV-2/FLU/RSV  testing.  Fact Sheet for Patients: PinkCheek.be  Fact Sheet for Healthcare Providers: GravelBags.it  This test is not yet approved or cleared by the [place] and  has been authorized for detection and/or diagnosis of SARS-CoV-2 by  FDA under an Emergency Use Authorization (EUA). This EUA will remain  in effect (meaning this test can be used) for the duration of the  Covid-19 declaration under Section 564(b)(1) of the Act, 21  U.S.C. section 360bbb-3(b)(1), unless the authorization is  terminated or revoked. Performed at [facility], [place] [address] [place], [place] [address]   MRSA PCR Screening     Status: None   Collection Time: 09/25/20  9:29 PM   Specimen: Nasal Mucosa; Nasopharyngeal  Result Value Ref Range Status   MRSA by PCR NEGATIVE NEGATIVE Final    Comment:        The GeneXpert MRSA Assay (FDA approved for NASAL specimens only), is one component of a comprehensive MRSA colonization surveillance program. It is not intended to diagnose MRSA infection nor to guide or monitor treatment for MRSA infections. Performed at [facility], [place] [address], [place], [place] [address]   Gastrointestinal Panel by PCR , Stool     Status: Abnormal   Collection Time: 09/30/20 11:42 AM   Specimen: Stool  Result Value Ref Range Status   Campylobacter species NOT DETECTED NOT DETECTED Final   Plesimonas shigelloides NOT DETECTED NOT  DETECTED Final   Salmonella species NOT DETECTED NOT DETECTED Final   Yersinia enterocolitica NOT DETECTED NOT DETECTED Final   Vibrio species NOT DETECTED NOT DETECTED Final   Vibrio cholerae NOT DETECTED NOT DETECTED Final   Enteroaggregative E coli (EAEC) NOT DETECTED NOT DETECTED Final   Enteropathogenic E coli (EPEC) DETECTED (A) NOT DETECTED Final    Comment: RESULT CALLED TO, READ BACK BY AND VERIFIED WITH: AMINA MOUHAMUD 10/01/20 AT 1220 BY ACR    Enterotoxigenic E coli (ETEC) NOT DETECTED NOT DETECTED Final   Shiga like toxin producing E coli (STEC) NOT DETECTED NOT DETECTED Final   Shigella/Enteroinvasive E coli (EIEC) NOT DETECTED NOT DETECTED Final   Cryptosporidium NOT DETECTED NOT DETECTED Final   Cyclospora cayetanensis NOT DETECTED NOT DETECTED Final   Entamoeba histolytica NOT DETECTED NOT DETECTED Final   Giardia lamblia NOT DETECTED NOT DETECTED Final   Adenovirus F40/41 NOT DETECTED NOT DETECTED Final   Astrovirus NOT DETECTED NOT DETECTED Final   Norovirus GI/GII NOT DETECTED NOT DETECTED Final   Rotavirus A NOT DETECTED NOT DETECTED Final   Sapovirus (I, II, IV, and V) NOT DETECTED NOT DETECTED Final    Comment: Performed at [facility], [address], [place], [place] [address]         Radiology Studies: No results found.      Scheduled Meds: . amiodarone  100 mg Oral Daily  . apixaban  5 mg Oral BID  . diazepam  2 mg Oral TID  . insulin aspart  0-9 Units Subcutaneous Q4H  . [START ON 10/04/2020] metolazone  5 mg Oral Daily  . potassium chloride  40 mEq Oral Q4H  . sodium chloride flush  3 mL Intravenous Q12H   Continuous Infusions: . sodium chloride Stopped (10/02/20 1029)  . furosemide      Assessment & Plan:   Principal Problem:   Acute respiratory failure (HCC) Active Problems:   Essential hypertension   Type 2 diabetes mellitus with neuropathy   Morbid obesity (HCC)   PAF (paroxysmal atrial fibrillation) (HCC)   COPD  (chronic obstructive pulmonary disease) ([place])  Acute on chronic combined systolic and diastolic CHF (congestive heart failure) (HCC)   OSA (obstructive sleep apnea)   Acute encephalopathy   Acute respiratory failure with hypoxia and hypercapnia (HCC)   Acute systolic CHF (congestive heart failure) (HCC)   Stage III pressure ulcer of sacral region ([place])   Acute on chronic hypoxic and hypercapneicrespiratory failure: The patient was admitted with respiratory acidosis and hypoxia that required BIPAP. Repeat ABG with the patient on BIPAP demonstrated somewhat improved, but continued respiratory acidosis and hypercarbia.  Per pulmonology the patient need only wear BIPAP at night. He has been intermittently refusing to wear his O2 or BIPAP. He is currently requiring 15 L by [place] to maintain SaO2 in the 90's.   CTA chest was considered to rule out PE, but cannot due to creatinine of 3.69. Due to patient's high O2 requirements  doubt that he could tolerate VQ scan.  12/1-pccm -no evidence of AE COPD. Persistent hypercapnia multifactorial with volume overload and deconditioning. Please continue with volume removal needed. Check a.m. BMP Continue Lasix and metolazone Nephrology consulted Continue to wear BiPAP at night and when sleeping PT OT Limit sedation and reducing Valium if possible   Acute on chronic combined systolic and diastolic CHF (congestive heart failure) with severe RV systolic failure and dilatation: The patient is on 3-4 L of O2 at home. He presented with pulmonary edema, pedal edema and BNP elevation of 3275. 12/1-continue IV Lasix and metolazone I's and O's Daily weight   Severe hypokalemia:  Continue supplementation, also give IV kcl along with po K2.8 Check a.m. potassium  Hypomagnesemia:    Now stable  .  Mag 2.0   Acute encephalopathy: Resolved. Due to hypercarbia and possibly Neurontin which has been held. 12/2-continue to monitor MS   Diarrhea:   Improving continue to monitor  A/CKD 4: Creatinine/GFR was worsening, now slowly improving. However with much edema on metalozone and lasix, will consult nephrology for further mx/recommendation  Mildly elevated Troponin: Likely due to cardiac strain in setting of severe renal disease  Type 2 diabetes mellitus with neuropathy: HBA1c of 09/25/2020 was 6.7. BG is stable.  Morbid obesity: This complicates all cares. Body mass index is 35.26 kg/m.  PAF (paroxysmal atrial fibrillation): Sinus rhythm per EKG 09/25/2020. Monitor on telemetry. Amiodarone and Apixaban to be continued. Rate is well controlled.   Chronic pain: Narcotics are held as them may depress respiration and worsen respiratory acidosis and hypercarbia. He is receiving IV acetaminophen. I will start the patient on a low dose of hydrocodone due to his severe hip pain. Will avoid restarting his 10/325 mg percocets as he takes at time out of concern for respiratory depression and worsening acidosis.   Normocytic anemia: Hb has ranged from 8.8-11 in the past year. Hemoglobin is stable at 10.9.  H/o PEA arrest in 10/20: Noted. Monitor on telemetry.   DVT prophylaxis: Eliquis Code Status: Full Family Communication: Wife at bedside  Status is: Inpatient  Remains inpatient appropriate because:IV treatments appropriate due to intensity of illness or inability to take PO   Dispo: The patient is from: Home              Anticipated d/c is to: SNF              Anticipated d/c date is: 3 days              Patient currently is not medically stable to d/c.  LOS: 8 days   Time spent: 35 min with >50% on coc   Nolberto Hanlon, MD [facility] Pager [phone]  If 7PM-7AM, please contact night-coverage [email or web address] Password [email or web address] 10/03/2020, 1:13 PM

## 2020-10-03 NOTE — TOC Progression Note (Signed)
Transition of Care [facility]) - Progression Note    Patient Details  Name: Nicholas Caldwell MRN: [id number] Date of Birth: 1946/03/08  Transition of Care [facility]) CM/SW [place], [place] Phone Number: 10/03/2020, 5:05 PM  Clinical Narrative:    CSW reached out to pt's daughter [name] in ref to possible dc SNF plans. [name] states pt is again stubborn and not likely to go to a SNF. [name] states if pt decides to go he would like to use his secondary insurance and not the [place] benefits as he would want to be local. CSW went ahead and sent out offers for pt. [name] stated pt's wife's cell phone number is [phone]. CSW will follow up with pt's wife on plan for SNF vs home.           Expected Discharge Plan and Services                                                 Social Determinants of Health (SDOH) Interventions    Readmission Risk Interventions Readmission Risk Prevention Plan 08/15/2019 06/13/2019 04/14/2019  Transportation Screening Complete Complete Complete  Medication Review Press photographer) Not Complete Complete Complete  Med Review Comments requested discharge medication reconciliation by attending - -  PCP or Specialist appointment within 3-5 days of discharge Not Complete Complete Complete  PCP/Specialist Appt Not Complete comments [facility] and will contact pt directly regarding follow up appt - 6/17 with cardiology  [place] or Home Care Consult Complete Complete Complete  SW Recovery Care/Counseling Consult - Complete Complete  Palliative Care Screening - Not Applicable Not [place] - Not Applicable Not Applicable  Some recent data might be hidden

## 2020-10-03 NOTE — Plan of Care (Signed)

## 2020-10-03 NOTE — Consult Note (Addendum)
Reason for Consult:Assist with diuretic management Referring Physician: Dr. Everardo Pacific Laday is an 74 y.o. male.  HPI: This is a 74 year old male with a history of NISCM, systolic CHF, and mod RV failure, DM, CKDIV, PAF s/p DCCV, COPD on home 3-4L O2, OHS/OSA, CAD who presented for SOB, inreased lower extremity swelling, and hypercarbic and hypoxic respiratory failure requiring 6L [place]. He has been on and off BiPAP while he has been here, has had some improvement in hypercarbia and acidosis. He has been receiving IV diuresis with lasix 120 mg TID and metolazone. He and his wife report that he is doing better since admission, they feel like his breathing and mental status has been improving. His mental status has improved. He denies any fevers, chills, nausea, vomiting, chest pain, or headaches. He feels like he is emptying his bladder completely. He is on 4-5 L [place] at home, currently on Cr was slightly increasing since admission however has now been improving, today it is 2.97. He had a repeat echocardiogram 11/24 that showed EF 50-55%, low/normal LV function, no WMA, RV function severely reduced, pulmonary arterty systolic pressure estimated to be 68.2 mmHg, RA dilated trivial MVR, mod/severe tricuspid valve regurgitation.   Trend in Creatinine: Creatinine, Ser  Date/Time Value Ref Range Status  10/03/2020 02:18 AM 2.97 (H) 0.61 - 1.24 mg/dL Final  10/02/2020 12:19 PM 3.06 (H) 0.61 - 1.24 mg/dL Final  10/02/2020 12:36 AM 3.11 (H) 0.61 - 1.24 mg/dL Final  09/30/2020 12:25 AM 3.69 (H) 0.61 - 1.24 mg/dL Final  09/29/2020 09:00 PM 3.63 (H) 0.61 - 1.24 mg/dL Final  09/29/2020 02:16 PM 3.80 (H) 0.61 - 1.24 mg/dL Final  09/29/2020 01:03 AM 3.95 (H) 0.61 - 1.24 mg/dL Final  09/28/2020 12:23 AM 4.06 (H) 0.61 - 1.24 mg/dL Final  09/27/2020 03:33 AM 3.94 (H) 0.61 - 1.24 mg/dL Final  09/26/2020 01:02 AM 3.61 (H) 0.61 - 1.24 mg/dL Final  09/25/2020 02:01 PM 3.54 (H) 0.61 - 1.24 mg/dL Final  08/28/2020 04:22  PM 2.98 (H) 0.61 - 1.24 mg/dL Final  07/25/2020 03:53 PM 3.64 (H) 0.61 - 1.24 mg/dL Final  02/17/2020 01:09 PM 3.52 (H) 0.61 - 1.24 mg/dL Final  02/10/2020 03:04 PM 5.03 (H) 0.61 - 1.24 mg/dL Final  12/05/2019 03:41 PM 3.48 (H) 0.61 - 1.24 mg/dL Final  11/21/2019 08:57 PM 4.01 (H) 0.61 - 1.24 mg/dL Final  11/17/2019 02:07 PM 3.95 (H) 0.61 - 1.24 mg/dL Final  11/01/2019 03:43 PM 2.86 (H) 0.61 - 1.24 mg/dL Final  09/15/2019 03:30 PM 2.21 (H) 0.61 - 1.24 mg/dL Final  08/29/2019 11:20 AM 1.64 (H) 0.61 - 1.24 mg/dL Final  08/28/2019 06:19 AM 1.63 (H) 0.61 - 1.24 mg/dL Final  08/27/2019 06:14 AM 2.08 (H) 0.61 - 1.24 mg/dL Final  08/26/2019 04:30 AM 2.05 (H) 0.61 - 1.24 mg/dL Final  08/25/2019 03:14 PM 2.18 (H) 0.61 - 1.24 mg/dL Final  08/25/2019 04:45 AM 2.40 (H) 0.61 - 1.24 mg/dL Final  08/24/2019 03:36 PM 2.44 (H) 0.61 - 1.24 mg/dL Final  08/24/2019 04:49 AM 2.37 (H) 0.61 - 1.24 mg/dL Final  08/23/2019 04:54 AM 2.60 (H) 0.61 - 1.24 mg/dL Final  08/22/2019 02:31 AM 2.60 (H) 0.61 - 1.24 mg/dL Final  08/21/2019 01:58 AM 2.53 (H) 0.61 - 1.24 mg/dL Final  08/21/2019 12:17 AM 2.75 (H) 0.61 - 1.24 mg/dL Final  08/20/2019 10:10 PM 2.90 (H) 0.61 - 1.24 mg/dL Final  08/18/2019 10:16 AM 2.38 (H) 0.61 - 1.24 mg/dL Final  08/15/2019 05:54  AM 1.70 (H) 0.61 - 1.24 mg/dL Final  08/14/2019 05:39 AM 1.59 (H) 0.61 - 1.24 mg/dL Final  08/13/2019 02:57 AM 1.82 (H) 0.61 - 1.24 mg/dL Final  08/12/2019 03:04 AM 1.92 (H) 0.61 - 1.24 mg/dL Final  08/11/2019 05:00 AM 2.16 (H) 0.61 - 1.24 mg/dL Final  08/10/2019 04:43 AM 2.19 (H) 0.61 - 1.24 mg/dL Final  08/09/2019 02:41 PM 2.25 (H) 0.61 - 1.24 mg/dL Final  08/02/2019 03:17 PM 2.14 (H) 0.61 - 1.24 mg/dL Final  07/22/2019 01:29 AM 1.69 (H) 0.61 - 1.24 mg/dL Final  07/21/2019 03:00 AM 1.56 (H) 0.61 - 1.24 mg/dL Final  07/20/2019 02:21 AM 1.50 (H) 0.61 - 1.24 mg/dL Final  07/19/2019 02:30 AM 1.61 (H) 0.61 - 1.24 mg/dL Final  07/18/2019 02:47 AM 1.86 (H) 0.61 - 1.24  mg/dL Final  07/17/2019 02:16 AM 1.77 (H) 0.61 - 1.24 mg/dL Final  07/16/2019 02:33 AM 1.70 (H) 0.61 - 1.24 mg/dL Final  07/15/2019 06:52 AM 1.67 (H) 0.61 - 1.24 mg/dL Final  07/14/2019 02:37 AM 1.81 (H) 0.61 - 1.24 mg/dL Final  07/13/2019 02:41 AM 1.84 (H) 0.61 - 1.24 mg/dL Final    PMH:   Past Medical History:  Diagnosis Date  . Atrial flutter ([place])    a. recurrent AFlutter with RVR;  b. Amiodarone Rx started 4/16  . CAD (coronary artery disease)    a. LHC 1/16:  mLAD diffuse disease, pLCx mild disease, dLCx with disease but too small for PCI, RCA ok, EF 25-30%  . Chronic pain   . Chronic systolic CHF (congestive heart failure) ([place])   . COPD (chronic obstructive pulmonary disease) ([place])   . Diabetes mellitus without complication ([place])   . Ex-smoker 11/2017  . Hypercholesteremia   . Hypertension   . NICM (nonischemic cardiomyopathy) ([place])    a.dx 2016. b. 2D echo 06/2016 - Last echo 07/01/16: mod dilated LV, mod LVH, EF 25-30%, mild-mod MR, sev LAE, mild-mod reduced RV systolic function, mild-mod TR, PASP 28mHG.  .Marland KitchenPAF (paroxysmal atrial fibrillation) ([facility])    On amio - ot a candidate for flecainide due to cardiomyopathy, not a candidate for Tikosyn due to prolonged QT, and felt to be a poor candidate for ablation given left atrial size.  . Pulmonary hypertension ([place]   . Tobacco abuse     PSH:   Past Surgical History:  Procedure Laterality Date  . BIOPSY  03/11/2019   Procedure: BIOPSY;  Surgeon: CLavena Bullion DO;  [facility]  Service: Gastroenterology;;  . BIOPSY  03/15/2019   Procedure: BIOPSY;  Surgeon: MIrving Copas, MD;  [place] [facility]  Service: Gastroenterology;;  . CARDIOVERSION N/A 07/30/2018   Procedure: CARDIOVERSION;  Surgeon: MLarey Dresser MD;  [facility]  Service: Cardiovascular;  Laterality: N/A;  . COLONOSCOPY N/A 03/11/2019   Procedure: COLONOSCOPY;  Surgeon: CLavena Bullion DO;  [place] [facility]   Service: Gastroenterology;  Laterality: N/A;  . ENTEROSCOPY N/A 03/15/2019   Procedure: ENTEROSCOPY;  Surgeon: MRush LandmarkGTelford Nab, MD;  [place] [facility]  Service: Gastroenterology;  Laterality: N/A;  . ESOPHAGOGASTRODUODENOSCOPY Left 03/11/2019   Procedure: ESOPHAGOGASTRODUODENOSCOPY (EGD);  Surgeon: CLavena Bullion DO;  [facility]  Service: Gastroenterology;  Laterality: Left;  . LEFT AND RIGHT HEART CATHETERIZATION WITH CORONARY ANGIOGRAM N/A 11/06/2014   Procedure: LEFT AND RIGHT HEART CATHETERIZATION WITH CORONARY ANGIOGRAM;  Surgeon: JJettie Booze MD;  [facility];  Service: Cardiovascular;  Laterality: N/A;  . RIGHT/LEFT HEART CATH AND  CORONARY ANGIOGRAPHY N/A 12/06/2018   Procedure: RIGHT/LEFT HEART CATH AND CORONARY ANGIOGRAPHY;  Surgeon: Larey Dresser, MD;  Location: [facility];  Service: Cardiovascular;  Laterality: N/A;  . SPLENECTOMY    . SUBMUCOSAL TATTOO INJECTION  03/15/2019   Procedure: SUBMUCOSAL TATTOO INJECTION;  Surgeon: Irving Copas., MD;  Location: [facility];  Service: Gastroenterology;;    Allergies:  Allergies  Allergen Reactions  . Bidil [Isosorb Dinitrate-Hydralazine] Shortness Of Breath  . Benazepril Nausea And Vomiting  . Brimonidine     Other reaction(s): Other (See Comments)  . Metformin     Other reaction(s): Cramps (ALLERGY/intolerance), Other (See Comments)  . Morphine Rash    Medications:   Prior to Admission medications   Medication Sig Start Date End Date Taking? Authorizing Provider  albuterol (PROVENTIL) (2.5 MG/3ML) 0.083% nebulizer solution Take 3 mLs (2.5 mg total) by nebulization every 4 (four) hours as needed for wheezing or shortness of breath. 06/21/20  Yes Tanda Rockers, MD  albuterol (VENTOLIN HFA) 108 (90 Base) MCG/ACT inhaler Inhale 2 puffs into the lungs every 6 (six) hours as needed for wheezing or shortness of breath.   Yes [provider]  amiodarone (PACERONE)  200 MG tablet Take 0.5 tablets (100 mg total) by mouth daily. 08/28/20  Yes Larey Dresser, MD  ascorbic acid (VITAMIN C) 500 MG tablet Take 500 mg by mouth 3 (three) times a week. Monday Wednesday Friday   Yes [provider]  Budeson-Glycopyrrol-Formoterol (BREZTRI AEROSPHERE) 160-9-4.8 MCG/ACT AERO Inhale 2 puffs into the lungs 2 (two) times daily. 06/21/20  Yes Tanda Rockers, MD  carbamide peroxide (DEBROX) 6.5 % OTIC solution Place 5-10 drops into both ears 3 (three) times daily.   Yes [provider]  Carboxymethylcellulose Sod PF 0.25 % SOLN Place 1 drop into both eyes 4 (four) times daily.   Yes [provider]  cetirizine (ZYRTEC) 10 MG tablet Take 10 mg by mouth daily as needed for allergies.    Yes [provider]  Cholecalciferol 50 MCG (2000 UT) TABS Take 2,000 Units by mouth daily.   Yes [provider]  diazepam (VALIUM) 10 MG tablet Take 10 mg by mouth 3 (three) times daily.   Yes [provider]  Ensure Max Protein (ENSURE MAX PROTEIN) LIQD Take 330 mLs (11 oz total) by mouth daily. 08/29/19  Yes Hosie Poisson, MD  famotidine (PEPCID) 20 MG tablet Take 1 tablet (20 mg total) by mouth 2 (two) times daily. 10/27/19  Yes Larey Dresser, MD  FEROSUL 325 (65 Fe) MG tablet Take 325 mg by mouth 3 (three) times a week. Monday Wednesday Friday 03/31/19  Yes [provider]  fluticasone (FLONASE) 50 MCG/ACT nasal spray Place 2 sprays into both nostrils daily as needed for allergies.    Yes [provider]  gabapentin (NEURONTIN) 300 MG capsule Take 1 capsule (300 mg total) by mouth 2 (two) times daily. Patient taking differently: Take 300 mg by mouth daily.  06/13/19  Yes Swayze, Ava, DO  hydrocortisone (ANUSOL-HC) 2.5 % rectal cream Place 1 application rectally 2 (two) times daily as needed for hemorrhoids or itching.    Yes [provider]  hydrocortisone 1 % lotion Apply 1 application topically See admin  instructions. Apply small amount to back twice daily for itchy rash as needed   Yes [provider]  insulin aspart (NOVOLOG) 100 UNIT/ML injection Inject 4 Units into the skin 3 (three) times daily with meals. 03/31/19  Yes Irene Pap N, DO  insulin aspart protamine - aspart (NOVOLOG MIX 70/30 FLEXPEN) (70-30) 100 UNIT/ML FlexPen Inject 0.1 mLs (10 Units total) into the skin 2 (two) times daily. 03/31/19  Yes Hall, Carole N, DO  latanoprost (XALATAN) 0.005 % ophthalmic solution Place 1 drop into both eyes at bedtime as needed (dry eyes).    Yes [provider]  metolazone (ZAROXOLYN) 2.5 MG tablet Take 1 tablet (2.5 mg total) by mouth once a week. Every Wednesday Patient taking differently: Take 2.5 mg by mouth once a week. Take on Wednesdays 08/28/20  Yes Larey Dresser, MD  oxybutynin (DITROPAN) 5 MG tablet Take 1 tablet (5 mg total) by mouth 4 (four) times daily. Patient taking differently: Take 5 mg by mouth 4 (four) times daily as needed for bladder spasms.  06/13/19  Yes Swayze, Ava, DO  oxyCODONE-acetaminophen (PERCOCET) 10-325 MG tablet Take 1 tablet by mouth every 6 (six) hours as needed for pain.  05/16/19  Yes [provider]  pantoprazole (PROTONIX) 40 MG tablet Take 40 mg by mouth 2 (two) times daily as needed (heartburn).    Yes [provider]  polyethylene glycol (MIRALAX / GLYCOLAX) 17 g packet Take 17 g by mouth daily as needed. Patient taking differently: Take 17 g by mouth daily as needed for mild constipation.  08/29/19  Yes Hosie Poisson, MD  potassium chloride SA (KLOR-CON) 20 MEQ tablet Take 4 tablets (80 mEq total) by mouth daily. With additional 50mq when you take Metolazone. Patient taking differently: Take 20-60 mEq by mouth See admin instructions. 621m twice daily and an additional 2070mwhen taking metolazone on Wednesdays 07/27/20  Yes McLLarey DresserD  rosuvastatin (CRESTOR) 40 MG tablet Take 20 mg by mouth daily.   Yes [provider]  senna-docusate (SENOKOT-S) 8.6-50 MG tablet Take 1 tablet by mouth at bedtime as needed for mild constipation. 08/29/19  Yes AkuHosie PoissonD  sodium chloride (OCEAN) 0.65 % SOLN nasal spray Place 2 sprays into both nostrils as needed for congestion.    Yes [provider]  tamsulosin (FLOMAX) 0.4 MG CAPS capsule Take 0.4 mg by mouth daily as needed (urination).    Yes [provider]  terbinafine (LAMISIL) 1 % cream Apply 1 application topically daily. To groin for 4 weeks daily with zinc oxide over it   Yes [provider]  torsemide (DEMADEX) 20 MG tablet Take 4 tablets (80 mg total) by mouth 2 (two) times daily. 11/18/19  Yes McLLarey DresserD  zinc oxide 20 % ointment Apply 1 application topically 2 (two) times daily as needed for irritation (rash).    Yes [provider]  apixaban (ELIQUIS) 5 MG TABS tablet Take 1 tablet (5 mg total) by mouth 2 (two) times daily. Patient taking differently: Take 2.5 mg by mouth 2 (two) times daily.  08/29/19   AkuHosie PoissonD  Insulin Syringe 27G X 1/2" 0.5 ML MISC 100 Syringes by Does not apply route 3 (three) times daily. 08/29/19   AkuHosie PoissonD  OXYGEN Inhale 3 L into the lungs continuous.     [provider]  predniSONE (DELTASONE) 10 MG tablet Take 4 each am x 2 days,   2 each am x 2 days,  1 each am x 2 days and stop Patient not taking: Reported on 09/26/2020 09/19/20   WerTanda RockersD  PRESCRIPTION MEDICATION Cpap    [provider]    Inpatient medications: .  amiodarone  100 mg Oral Daily  . apixaban  5 mg Oral BID  . diazepam  2 mg Oral TID  . insulin aspart  0-9 Units Subcutaneous Q4H  . [START ON 10/04/2020] metolazone  5 mg Oral Daily  . potassium chloride  40 mEq Oral Q4H  . sodium chloride flush  3 mL Intravenous Q12H    Discontinued Meds:   Medications Discontinued During This Encounter  Medication Reason  . furosemide (LASIX) injection 120 mg   .  heparin injection 5,000 Units   . rosuvastatin (CRESTOR) 20 MG tablet   . HYDROcodone-acetaminophen (NORCO) 7.5-325 MG per tablet 1 tablet   . diazepam (VALIUM) tablet 10 mg   . potassium chloride 10 mEq in 977 mL IVPB Duplicate  . metolazone (ZAROXOLYN) tablet 5 mg   . potassium chloride 20 MEQ/15ML (10%) solution 40 mEq   . furosemide (LASIX) 120 mg in dextrose 5 % 50 mL IVPB   . metolazone (ZAROXOLYN) tablet 5 mg     Social History:  reports that he quit smoking about 22 months ago. His smoking use included cigarettes. He has a 34.00 pack-year smoking history. He has never used smokeless tobacco. He reports that he does not drink alcohol and does not use drugs.  Family History:   Family History  Problem Relation Age of Onset  . Heart disease Mother   . Hypertension Mother   . Heart failure Mother   . Heart disease Father   . Hypertension Sister   . Heart attack Neg Hx   . Stroke Neg Hx   . Colon cancer Neg Hx   . Esophageal cancer Neg Hx     Pertinent items are noted in HPI. Weight change: -2.3 kg  Intake/Output Summary (Last 24 hours) at 10/03/2020 1318 Last data filed at 10/03/2020 0955 Gross per 24 hour  Intake 679.72 ml  Output 3300 ml  Net -2620.28 ml   BP (P) 126/69 (BP Location: Left Arm)   Pulse 94   Temp 98.9 F (37.2 C) (Oral)   Resp (!) 33   Ht 6' (1.829 m)   Wt 104 kg   SpO2 94%   BMI 31.10 kg/m  Vitals:   10/03/20 0906 10/03/20 1104 10/03/20 1105 10/03/20 1107  BP:      Pulse: 98 96 91 94  Resp: (!) 24 (!) 3 (!) 31 (!) 33  Temp:      TempSrc:      SpO2: 90% 92% 93% 94%  Weight:      Height:         General appearance: alert, cooperative, no distress and moderately obese Head: Normocephalic, without obvious abnormality, atraumatic Resp: diminished breath sounds throughout and rhonchi bibasilar Chest wall: no tenderness Cardio: regular rate and rhythm, S1, S2 normal, no murmur, click, rub or gallop GI: soft, non-tender; bowel sounds normal;  no masses,  no organomegaly Extremities: edema Trace BL edema Pulses: 2+ and symmetric  Labs: Basic Metabolic Panel: Recent Labs  Lab 09/29/20 0103 09/29/20 1416 09/29/20 2100 09/30/20 0025 10/01/20 1609 10/02/20 0036 10/02/20 1219 10/03/20 0218  NA 143 146* 145 144  --  146* 142 145  K 2.9* 2.6* 4.2 3.6  --  2.2* 2.2* 2.8*  CL 95* 93* 94* 95*  --  87* 84* 84*  CO2 32 36* 34* 33*  --  40* 39* 41*  GLUCOSE 130* 141* 133* 143*  --  120* 132* 125*  BUN 61* 56* 58* 57*  --  67*  69* 75*  CREATININE 3.95* 3.80* 3.63* 3.69*  --  3.11* 3.06* 2.97*  CALCIUM 8.1* 8.3* 8.2* 8.1*  --  8.2* 8.1* 8.9  PHOS  --   --   --   --  3.5  --   --   --    Liver Function Tests: No results for input(s): AST, ALT, ALKPHOS, BILITOT, PROT, ALBUMIN in the last 168 hours. No results for input(s): LIPASE, AMYLASE in the last 168 hours. No results for input(s): AMMONIA in the last 168 hours. CBC: Recent Labs  Lab 09/27/20 0333 09/28/20 0023 09/30/20 0025  WBC 8.1 6.5 6.6  NEUTROABS 4.7 4.6 5.3  HGB 10.3* 10.3* 10.9*  HCT 35.3* 36.0* 36.8*  MCV 88.7 89.6 86.2  PLT 319 312 278   PT/INR: _0 (inr:5) Cardiac Enzymes: )No results for input(s): CKTOTAL, CKMB, CKMBINDEX, TROPONINI in the last 168 hours. CBG: Recent Labs  Lab 10/02/20 1948 10/02/20 2308 10/03/20 0354 10/03/20 0829 10/03/20 1153  GLUCAP 145* 122* 119* 120* 127*    Iron Studies: No results for input(s): IRON, TIBC, TRANSFERRIN, FERRITIN in the last 168 hours.  Xrays/Other Studies: No results found.   Assessment/Plan: 1. CKDIV: Cr at baseline around 3.6. Cr on admission was 3.54, increased up to 4.06. Appears to be improving since admission, today Cr 2.97. Has been having adequate output with no issues urinating. He intermittently uses ibuprofen for pain control at home. Follows with [facility], last seen 04/11/20 when an ultrasound was ordered however has not been done yet. Would avoid aggressive diuresis.  -Since  this is stable would hold off on further evaluation inpatient.  -Trend BMP 2. Acute hypoxic and hypercarbic respiratory failure: Appears multifactorial 2/2 OSA/OHS, acute on chronic heart failure, COPD. Has been on and off BiPAP and now only on BiPAP at night. Patient receiving Lasix 120 mg TID and had recently been started on metolazone. Yesterday he had intake of 679, output of 3800, net output 3L. Total net output since admission is around 13L. Weight down from 117.9 to 104 kg (went up to 122 during admission).  He appears volume up on exam and requires further diuresis however he may be having too much output which puts him at risk of ATN or contraction alkalosis. Patient has already received dose of metolazone today, would hold off on further metolazone for now.  -Goal net output of 2-3L per day -Would continue Lasix 120 mg TID  -Hold off on further metolazone doses for now -Daily weights, strict I+Os -Trend BMP  3. Hypokalemia: K 2.8 today, being repleated. Continue to monitor.  4. Normocytic anemia: Last Hgb 10.9, around BL of 8.8-11. No evidence of bleeding on exam.  5. Paroxysmal A fib: Currently in NSR. Per primary 6. DM: 7. H/o PEA arrest: 8. Morbid obesity: 9. Chronic pain:   [name] 10/03/2020, 1:18 PM   I have seen and examined this patient and agree with plan and assessment in the above note with renal recommendations/intervention highlighted.  I personally interviewed and examined Mr. Milich and assisted in developing the assessment and plan with Dr. Sherry Ruffing.  His AKI/CKD stage IV has been slowly improving even in setting of vigorous diuresis.  He is net negative almost 14 liters since admission with marked improvement of his edema and weight.  I would not be more aggressive with his diuretics as this increases his risk of worsening AKI/CKD.  He has been net negative every day since admission.  I stopped the metolazone as he is  responding well to IV lasix alone and will  need to replete his potassium.  His O2 requirements have decreased with ongoing diuresis but will need to be patient and set goal net negative no more than 2-2.5 liters/day to minimize risk of worsening renal function.  Repeat ABG as he may need BiPap for ongoing hypercarbia. Broadus John A Rose Hegner,MD 10/03/2020 2:57 PM

## 2020-10-03 NOTE — Progress Notes (Signed)
RT went in to pt's room to do 0400 bipap check, pt was off bipap and back on HFNC.

## 2020-10-03 NOTE — Consult Note (Signed)
Consultation Note Date: 10/03/2020   Patient Name: Nicholas Caldwell  DOB: 07-17-1946  MRN: [id number]  Age / Sex: 74 y.o., male  PCP: [name], MD Referring Physician: Nolberto Hanlon, MD  Reason for Consultation: Establishing goals of care and Psychosocial/spiritual support  HPI/Patient Profile: 74 y.o. male admitted on 09/25/2020 with PMH significant for  NISCM, systolic CHF and mod RV failure, DM2, CKD 4, PAF s/p DCCV, COPD on home O2, OHS/ OSA on 3 L O2 and CPAP & CAD.  Today is day 8 of the patient's hospitalization.  He remains tenuous secondary to his multiple comorbidities of acute on chronic hypoxic hypercarbic respiratory failure, COPD, acute systolic CHF, sacral skin breakdown, and overall failure to thrive.  He is high risk for decompensation.  Patient has had continued physical and functional decline over the past year.    Patient and family face treatment option decisions, advanced directive decisions and anticipatory care needs.   Clinical Assessment and Goals of Care:   This NP Wadie Lessen reviewed medical records, received report from team, assessed the patient and then meet at the patient's bedside along with his wife  to discuss diagnosis, prognosis, GOC, EOL wishes disposition and options.   Concept of Palliative Care was introduced as specialized medical care for people and their families living with serious illness.  If focuses on providing relief from the symptoms and stress of a serious illness.  The goal is to improve quality of life for both the patient and the family.  This nurse practitioner met with this patient and his wife 1 year ago ago for complete goals of care meeting at that time.  Created space and opportunity for patient  and family to explore thoughts and feelings regarding current medical situation.  Patient is lethargic and has minimal engagement or feedback to  today's conversation.   A  discussion was had today regarding advanced directives.  Concepts specific to code status, artifical feeding and hydration, continued IV antibiotics and rehospitalization was had.  The difference between a aggressive medical intervention path  and a palliative comfort care path for this patient at this time was had.  Values and goals of care important to patient and family were attempted to be elicited.   Education offered on the concept of human mortality and adult failure to thrive.  Education offered on the limitations of medical interventions to prolong quality of life when the body fails to thrive.  Patient and his wife verbalize an understanding of the seriousness of his current medical intervention however they remain hopeful for improvement and prolonged life and are open to all offered and available medical interventions to prolong life.    Questions and concerns addressed.  Patient  encouraged to call with questions or concerns.     PMT will continue to support holistically.     No documented HPOA or ACP documents      SUMMARY OF RECOMMENDATIONS    Code Status/Advance Care Planning:  Full code  Encouraged patient/family to consider DNR/DNI status understanding  evidenced based poor outcomes in similar hospitalized patient, as the cause of arrest is likely associated with advanced chronic illness rather than an easily reversible acute cardio-pulmonary event.   Palliative Prophylaxis:   Aspiration, Bowel Regimen, Delirium Protocol, Frequent Pain Assessment and Oral Care  Additional Recommendations (Limitations, Scope, Preferences):  Full Scope Treatment  Psycho-social/Spiritual:   Desire for further Chaplaincy support:yes  Additional Recommendations: Education on Hospice  Prognosis:   Will depend on patient's decision for life prolonging measures.  Regardless of medical interventions I believe this patient is hospice eligible with a  prognosis of less than 6 months.  Discharge Planning: To Be Determined      Primary Diagnoses: Present on Admission: . Acute respiratory failure ([place]) . Acute systolic CHF (congestive heart failure) ([place]) . Essential hypertension . PAF (paroxysmal atrial fibrillation) ([place]) . COPD (chronic obstructive pulmonary disease) ([place]) . Acute on chronic combined systolic and diastolic CHF (congestive heart failure) ([place]) . Acute encephalopathy . Acute respiratory failure with hypoxia and hypercapnia (H[facility]) . Morbid obesity ([place]) . OSA (obstructive sleep apnea)   I have reviewed the medical record, interviewed the patient and family, and examined the patient. The following aspects are pertinent.  Past Medical History:  Diagnosis Date  . Atrial flutter ([place])    a. recurrent AFlutter with RVR;  b. Amiodarone Rx started 4/16  . CAD (coronary artery disease)    a. LHC 1/16:  mLAD diffuse disease, pLCx mild disease, dLCx with disease but too small for PCI, RCA ok, EF 25-30%  . Chronic pain   . Chronic systolic CHF (congestive heart failure) ([place])   . COPD (chronic obstructive pulmonary disease) ([place])   . Diabetes mellitus without complication ([place])   . Ex-smoker 11/2017  . Hypercholesteremia   . Hypertension   . NICM (nonischemic cardiomyopathy) ([place])    a.dx 2016. b. 2D echo 06/2016 - Last echo 07/01/16: mod dilated LV, mod LVH, EF 25-30%, mild-mod MR, sev LAE, mild-mod reduced RV systolic function, mild-mod TR, PASP 17mHG.  .Marland KitchenPAF (paroxysmal atrial fibrillation) (H[facility])    On amio - ot a candidate for flecainide due to cardiomyopathy, not a candidate for Tikosyn due to prolonged QT, and felt to be a poor candidate for ablation given left atrial size.  . Pulmonary hypertension ([place]   . Tobacco abuse    Social History   Socioeconomic History  . Marital status: Married    Spouse name: Not on file  . Number of children: Not on file  . Years of education: Not on file  . Highest education  level: Not on file  Occupational History  . Not on file  Tobacco Use  . Smoking status: Former Smoker    Packs/day: 1.00    Years: 34.00    Pack years: 34.00    Types: Cigarettes    Quit date: 11/30/2018    Years since quitting: 1.8  . Smokeless tobacco: Never Used  Vaping Use  . Vaping Use: Never used  Substance and Sexual Activity  . Alcohol use: No    Alcohol/week: 0.0 standard drinks  . Drug use: No  . Sexual activity: Not on file  Other Topics Concern  . Not on file  Social History Narrative   ** Merged History Encounter **       Social Determinants of Health   Financial Resource Strain:   . Difficulty of Paying Living Expenses: Not on file  Food Insecurity:   . Worried About RCharity fundraiserin the  Last Year: Not on file  . Ran Out of Food in the Last Year: Not on file  Transportation Needs:   . Lack of Transportation (Medical): Not on file  . Lack of Transportation (Non-Medical): Not on file  Physical Activity:   . Days of Exercise per Week: Not on file  . Minutes of Exercise per Session: Not on file  Stress:   . Feeling of Stress : Not on file  Social Connections:   . Frequency of Communication with Friends and Family: Not on file  . Frequency of Social Gatherings with Friends and Family: Not on file  . Attends Religious Services: Not on file  . Active Member of Clubs or Organizations: Not on file  . Attends Archivist Meetings: Not on file  . Marital Status: Not on file   Family History  Problem Relation Age of Onset  . Heart disease Mother   . Hypertension Mother   . Heart failure Mother   . Heart disease Father   . Hypertension Sister   . Heart attack Neg Hx   . Stroke Neg Hx   . Colon cancer Neg Hx   . Esophageal cancer Neg Hx    Scheduled Meds: . amiodarone  100 mg Oral Daily  . apixaban  5 mg Oral BID  . diazepam  2 mg Oral TID  . insulin aspart  0-9 Units Subcutaneous Q4H  . metolazone  5 mg Oral BID  . potassium chloride   40 mEq Oral Q4H  . sodium chloride flush  3 mL Intravenous Q12H   Continuous Infusions: . sodium chloride Stopped (10/02/20 1029)  . furosemide 120 mg (10/03/20 0546)   PRN Meds:.sodium chloride, acetaminophen, albuterol, HYDROcodone-acetaminophen, ondansetron (ZOFRAN) IV, sodium chloride flush Medications Prior to Admission:  Prior to Admission medications   Medication Sig Start Date End Date Taking? Authorizing Provider  albuterol (PROVENTIL) (2.5 MG/3ML) 0.083% nebulizer solution Take 3 mLs (2.5 mg total) by nebulization every 4 (four) hours as needed for wheezing or shortness of breath. 06/21/20  Yes Tanda Rockers, MD  albuterol (VENTOLIN HFA) 108 (90 Base) MCG/ACT inhaler Inhale 2 puffs into the lungs every 6 (six) hours as needed for wheezing or shortness of breath.   Yes [provider]  amiodarone (PACERONE) 200 MG tablet Take 0.5 tablets (100 mg total) by mouth daily. 08/28/20  Yes Larey Dresser, MD  ascorbic acid (VITAMIN C) 500 MG tablet Take 500 mg by mouth 3 (three) times a week. Monday Wednesday Friday   Yes [provider]  Budeson-Glycopyrrol-Formoterol (BREZTRI AEROSPHERE) 160-9-4.8 MCG/ACT AERO Inhale 2 puffs into the lungs 2 (two) times daily. 06/21/20  Yes Tanda Rockers, MD  carbamide peroxide (DEBROX) 6.5 % OTIC solution Place 5-10 drops into both ears 3 (three) times daily.   Yes [provider]  Carboxymethylcellulose Sod PF 0.25 % SOLN Place 1 drop into both eyes 4 (four) times daily.   Yes [provider]  cetirizine (ZYRTEC) 10 MG tablet Take 10 mg by mouth daily as needed for allergies.    Yes [provider]  Cholecalciferol 50 MCG (2000 UT) TABS Take 2,000 Units by mouth daily.   Yes [provider]  diazepam (VALIUM) 10 MG tablet Take 10 mg by mouth 3 (three) times daily.   Yes [provider]  Ensure Max Protein (ENSURE MAX PROTEIN) LIQD Take 330 mLs (11 oz total) by mouth daily. 08/29/19  Yes  Hosie Poisson, MD  famotidine (PEPCID) 20  MG tablet Take 1 tablet (20 mg total) by mouth 2 (two) times daily. 10/27/19  Yes Larey Dresser, MD  FEROSUL 325 (65 Fe) MG tablet Take 325 mg by mouth 3 (three) times a week. Monday Wednesday Friday 03/31/19  Yes [provider]  fluticasone (FLONASE) 50 MCG/ACT nasal spray Place 2 sprays into both nostrils daily as needed for allergies.    Yes [provider]  gabapentin (NEURONTIN) 300 MG capsule Take 1 capsule (300 mg total) by mouth 2 (two) times daily. Patient taking differently: Take 300 mg by mouth daily.  06/13/19  Yes Swayze, Ava, DO  hydrocortisone (ANUSOL-HC) 2.5 % rectal cream Place 1 application rectally 2 (two) times daily as needed for hemorrhoids or itching.    Yes [provider]  hydrocortisone 1 % lotion Apply 1 application topically See admin instructions. Apply small amount to back twice daily for itchy rash as needed   Yes [provider]  insulin aspart (NOVOLOG) 100 UNIT/ML injection Inject 4 Units into the skin 3 (three) times daily with meals. 03/31/19  Yes Hall, Carole N, DO  insulin aspart protamine - aspart (NOVOLOG MIX 70/30 FLEXPEN) (70-30) 100 UNIT/ML FlexPen Inject 0.1 mLs (10 Units total) into the skin 2 (two) times daily. 03/31/19  Yes Hall, Carole N, DO  latanoprost (XALATAN) 0.005 % ophthalmic solution Place 1 drop into both eyes at bedtime as needed (dry eyes).    Yes [provider]  metolazone (ZAROXOLYN) 2.5 MG tablet Take 1 tablet (2.5 mg total) by mouth once a week. Every Wednesday Patient taking differently: Take 2.5 mg by mouth once a week. Take on Wednesdays 08/28/20  Yes Larey Dresser, MD  oxybutynin (DITROPAN) 5 MG tablet Take 1 tablet (5 mg total) by mouth 4 (four) times daily. Patient taking differently: Take 5 mg by mouth 4 (four) times daily as needed for bladder spasms.  06/13/19  Yes Swayze, Ava, DO  oxyCODONE-acetaminophen (PERCOCET) 10-325 MG tablet Take 1  tablet by mouth every 6 (six) hours as needed for pain.  05/16/19  Yes [provider]  pantoprazole (PROTONIX) 40 MG tablet Take 40 mg by mouth 2 (two) times daily as needed (heartburn).    Yes [provider]  polyethylene glycol (MIRALAX / GLYCOLAX) 17 g packet Take 17 g by mouth daily as needed. Patient taking differently: Take 17 g by mouth daily as needed for mild constipation.  08/29/19  Yes Hosie Poisson, MD  potassium chloride SA (KLOR-CON) 20 MEQ tablet Take 4 tablets (80 mEq total) by mouth daily. With additional 61mq when you take Metolazone. Patient taking differently: Take 20-60 mEq by mouth See admin instructions. 629m twice daily and an additional 2058mwhen taking metolazone on Wednesdays 07/27/20  Yes McLLarey DresserD  rosuvastatin (CRESTOR) 40 MG tablet Take 20 mg by mouth daily.   Yes [provider]  senna-docusate (SENOKOT-S) 8.6-50 MG tablet Take 1 tablet by mouth at bedtime as needed for mild constipation. 08/29/19  Yes AkuHosie PoissonD  sodium chloride (OCEAN) 0.65 % SOLN nasal spray Place 2 sprays into both nostrils as needed for congestion.    Yes [provider]  tamsulosin (FLOMAX) 0.4 MG CAPS capsule Take 0.4 mg by mouth daily as needed (urination).    Yes [provider]  terbinafine (LAMISIL) 1 % cream Apply 1 application topically daily. To groin for 4 weeks daily with zinc oxide over it   Yes [provider]  torsemide (DEMADEX) 20 MG  tablet Take 4 tablets (80 mg total) by mouth 2 (two) times daily. 11/18/19  Yes Larey Dresser, MD  zinc oxide 20 % ointment Apply 1 application topically 2 (two) times daily as needed for irritation (rash).    Yes [provider]  apixaban (ELIQUIS) 5 MG TABS tablet Take 1 tablet (5 mg total) by mouth 2 (two) times daily. Patient taking differently: Take 2.5 mg by mouth 2 (two) times daily.  08/29/19   Hosie Poisson, MD  Insulin Syringe 27G X 1/2" 0.5 ML MISC 100  Syringes by Does not apply route 3 (three) times daily. 08/29/19   Hosie Poisson, MD  OXYGEN Inhale 3 L into the lungs continuous.     [provider]  predniSONE (DELTASONE) 10 MG tablet Take 4 each am x 2 days,   2 each am x 2 days,  1 each am x 2 days and stop Patient not taking: Reported on 09/26/2020 09/19/20   Tanda Rockers, MD  PRESCRIPTION MEDICATION Cpap    [provider]   Allergies  Allergen Reactions  . Bidil [Isosorb Dinitrate-Hydralazine] Shortness Of Breath  . Benazepril Nausea And Vomiting  . Brimonidine     Other reaction(s): Other (See Comments)  . Metformin     Other reaction(s): Cramps (ALLERGY/intolerance), Other (See Comments)  . Morphine Rash   Review of Systems  Neurological: Positive for weakness.    Physical Exam Constitutional:      Appearance: He is underweight.     Interventions: Nasal cannula in place.  Cardiovascular:     Rate and Rhythm: Normal rate.  Musculoskeletal:     Comments: generalized muscle atrophy  Skin:    General: Skin is warm and dry.  Neurological:     Mental Status: He is lethargic.     Vital Signs: BP (P) 126/69 (BP Location: Left Arm)   Pulse 98   Temp 98.9 F (37.2 C) (Oral)   Resp (!) 24   Ht 6' (1.829 m)   Wt 104 kg   SpO2 90%   BMI 31.10 kg/m  Pain Scale: Faces POSS *See Group Information*: S-Acceptable,Sleep, easy to arouse Pain Score: 0-No pain   SpO2: SpO2: 90 % O2 Device:SpO2: 90 % O2 Flow Rate: .O2 Flow Rate (L/min): 8 L/min  IO: Intake/output summary:   Intake/Output Summary (Last 24 hours) at 10/03/2020 1039 Last data filed at 10/03/2020 0955 Gross per 24 hour  Intake 919.72 ml  Output 4600 ml  Net -3680.28 ml    LBM: Last BM Date: 10/03/20 Baseline Weight: Weight: 117.9 kg Most recent weight: Weight: 104 kg     Palliative Assessment/Data:  30%    Discussed with Dr Kurtis Bushman  Time In: 090 Time Out: 1010 Time Total: 70 minutes Greater than 50%  of this time was spent  counseling and coordinating care related to the above assessment and plan.  Signed by: Wadie Lessen, NP   Please contact Palliative Medicine Team phone at [phone] for questions and concerns.  For individual provider: See Shea Evans

## 2020-10-04 ENCOUNTER — Inpatient Hospital Stay (HOSPITAL_COMMUNITY): Payer: No Typology Code available for payment source

## 2020-10-04 ENCOUNTER — Ambulatory Visit: Payer: No Typology Code available for payment source | Admitting: Pulmonary Disease

## 2020-10-04 DIAGNOSIS — J9601 Acute respiratory failure with hypoxia: Secondary | ICD-10-CM | POA: Diagnosis not present

## 2020-10-04 DIAGNOSIS — I2609 Other pulmonary embolism with acute cor pulmonale: Secondary | ICD-10-CM

## 2020-10-04 DIAGNOSIS — J42 Unspecified chronic bronchitis: Secondary | ICD-10-CM | POA: Diagnosis not present

## 2020-10-04 DIAGNOSIS — J9621 Acute and chronic respiratory failure with hypoxia: Secondary | ICD-10-CM | POA: Diagnosis not present

## 2020-10-04 DIAGNOSIS — G9340 Encephalopathy, unspecified: Secondary | ICD-10-CM | POA: Diagnosis not present

## 2020-10-04 DIAGNOSIS — I5043 Acute on chronic combined systolic (congestive) and diastolic (congestive) heart failure: Secondary | ICD-10-CM | POA: Diagnosis not present

## 2020-10-04 LAB — GLUCOSE, CAPILLARY
Glucose-Capillary: 102 mg/dL — ABNORMAL HIGH (ref 70–99)
Glucose-Capillary: 103 mg/dL — ABNORMAL HIGH (ref 70–99)
Glucose-Capillary: 109 mg/dL — ABNORMAL HIGH (ref 70–99)
Glucose-Capillary: 113 mg/dL — ABNORMAL HIGH (ref 70–99)
Glucose-Capillary: 129 mg/dL — ABNORMAL HIGH (ref 70–99)
Glucose-Capillary: 141 mg/dL — ABNORMAL HIGH (ref 70–99)

## 2020-10-04 LAB — BASIC METABOLIC PANEL
Anion gap: 22 — ABNORMAL HIGH (ref 5–15)
BUN: 88 mg/dL — ABNORMAL HIGH (ref 8–23)
CO2: 33 mmol/L — ABNORMAL HIGH (ref 22–32)
Calcium: 8.7 mg/dL — ABNORMAL LOW (ref 8.9–10.3)
Chloride: 86 mmol/L — ABNORMAL LOW (ref 98–111)
Creatinine, Ser: 3.1 mg/dL — ABNORMAL HIGH (ref 0.61–1.24)
GFR, Estimated: 21 mL/min — ABNORMAL LOW (ref >60)
Glucose, Bld: 134 mg/dL — ABNORMAL HIGH (ref 70–99)
Potassium: 3.4 mmol/L — ABNORMAL LOW (ref 3.5–5.1)
Sodium: 141 mmol/L (ref 135–145)

## 2020-10-04 MED ORDER — TORSEMIDE 20 MG PO TABS
40.0000 mg | ORAL_TABLET | Freq: Every day | ORAL | Status: DC
  Administered 2020-10-04: 40 mg via ORAL
  Filled 2020-10-04: qty 2

## 2020-10-04 MED ORDER — POTASSIUM CHLORIDE 20 MEQ PO PACK
40.0000 meq | PACK | ORAL | Status: AC
  Administered 2020-10-04 (×2): 40 meq via ORAL
  Filled 2020-10-04 (×2): qty 2

## 2020-10-04 MED ORDER — HYDROCORTISONE VALERATE 0.2 % EX CREA
TOPICAL_CREAM | Freq: Two times a day (BID) | CUTANEOUS | Status: DC | PRN
  Administered 2020-10-05: 1 via TOPICAL
  Filled 2020-10-04 (×2): qty 15

## 2020-10-04 MED ORDER — DIAZEPAM 2 MG PO TABS
2.0000 mg | ORAL_TABLET | Freq: Two times a day (BID) | ORAL | Status: DC
  Administered 2020-10-04 – 2020-10-09 (×10): 2 mg via ORAL
  Filled 2020-10-04 (×10): qty 1

## 2020-10-04 NOTE — Consult Note (Addendum)
Advanced Heart Failure Team Consult Note   Primary Physician: [name], MD PCP-Cardiologist:  [name]   Reason for Consultation: Acute on chronic prominent RV failure w/ severe pulmonary Hypertension   HPI:    Nicholas Caldwell is seen today for evaluation of acute on chronic prominent RV failure w/ severe pulmonary hypertension, at the request of Dr. Kurtis Bushman, Internal Medicine.   Nicholas Caldwell is a 74 y.o. male with a history of chronic systolic CHFdue to NICM (EF 40-45%)with moderate RV failure, COPD on 3 L O2, DM, HTN, OHS/OSA on CPAP, PAF s/p DCCV, hyperlipidemia, CAD, and noncompliance.He has multiple admissions due to HF and respiratory failure.   Admitted from 03/07/19-03/31/19 with massive volume overload with severe RV failure, AFL and cardiorenal syndrome. Attempts at diuresis limited by worsening creatinine which peaked at 2.6. Treated with milrinone but still unable to get CVP below 15. Hospital course c/b severe non-compliance with high fluid intake, PAFL, penile wound, acute versus subacute CVA and Citrobacter UTI. Underwent DC-CV of AFL into NSR.  D/c'd home on 5/28 with weight of 270 and creatinine 1.99. Readmitted6/6/2020with recurrent hypoxic/hypercarbic respiratory failure. (ABG 7.27/66/55/86%). Found to have severe hyperkalemia (K>7.5) and large left pleural effusion. Weightwasup about 10 pounds since d/c.Diuresed with IV lasix and later transitioned to torsemide 80 mg twice a day.  Discharge weight on 04/14/2019 was 251 pounds.  Admitted 01/02/48 with A/C systolic/diastolic heart failure. Bisoprolol was stopped on admit and not continued on discharge. Diuresed with with IV lasix and transitioned back to torsemide 80 mg twice a day + metolazone 4 times a week.   Admitted 7/0/26 with A/C systolic/diastolic heart failure + AECOPD. Diuresed with IV lasix and discharged on torsemide 80 mg twice a day + metolazone 4x a week. Treated with steroids and antibiotics. He  was placed on low dose bisoprolol.  Palliative Care consulted. MOST form was introduced. He wished to continue full code.   He was admitted in 10/20 with RLL PNA (treated with ceftriaxone), CHF exacerbation, and right pleural effusion.  He had right-sided thoracentesis.  He was admitted again in 10/20 with PEA arrest, thought to be due to overuse of narcotic pain meds and underuse of Bipap.  He was noted to be in atrial fibrillation and amiodarone was started, he went back into NSR. He was diuresed for CHF.   He continues to use home oxygen during the day and Bipap at night but not 100% compliant.   Recently seen in clinic by [name] 08/29/20 and was mildly fluid overloaded. ReDs Clip was borderline at 35%. He was continued on torsemide 80 mg bid and metolazone 2.5 mg once weekly was resumed. Was instructed to return in 1 month but canceled the appt.   Came to [facility] on 11/23 w/ CC of SOB and increased bilateral LEE and found to be in acute hypoxic respiratory failure  w/ O2 sats 86% on 4L [place]. CXR showed pulmonary edema. BNP 3,275. Was placed on NRB and started on IV Lasix. Scr 3.54 on admit c/w baseline.   He was admitted by IM and has had good response to IV Lasix, I/Os net negative 15L total.  Wt down 40 lb. SCr stable w/ diuresis, 3.10 today. Echo repeated this admit. EF 50-55%, RV severely reduced w/ severely elevated RV systolic pressure, 68 mmHg. PA severely dilated.   His last clinic wt was 249 lb. Wt today 225 lb.  Was 269 on admit.   He reports breathing has improved. Currently resting  w/ BiPAP on. Wife present at bedside. His main compliant now  is nausea and poor appetite. No vomiting. HFT last checked 11/23. AST was 20, ALT 13.   Also w/ Afib w/ RVR during admission, back in NSR 80s today.     Echo 09/26/20 Left ventricular ejection fraction, by estimation, is 50 to 55%. The left ventricle has low normal function. The left ventricle has no regional wall motion abnormalities.  Left ventricular diastolic function could not be evaluated. 2. Right ventricular systolic function is severely reduced. The right ventricular size is severely enlarged. There is severely elevated pulmonary artery systolic pressure. The estimated right ventricular systolic pressure is 18.8 mmHg. 3. Right atrial size was severely dilated. 4. The mitral valve was not well visualized. Trivial mitral valve regurgitation. 5. Tricuspid valve regurgitation is moderate to severe. 6. The aortic valve was not well visualized. Aortic valve regurgitation is not visualized. 7. Severely dilated pulmonary artery.   Review of Systems: [y] = yes, _0  = no   . General: Weight gain [Y ]; Weight loss _1 ; Anorexia _2 ; Fatigue [ Y]; Fever _3 ; Chills _4 ; Weakness _5   . Cardiac: Chest pain/pressure _6 ; Resting SOB [ Y]; Exertional SOB [Y ]; Orthopnea _7 ; Pedal Edema [ Y]; Palpitations _8 ; Syncope _9 ; Presyncope _10 ; Paroxysmal nocturnal dyspnea_11   . Pulmonary: Cough _12 ; Wheezing_13 ; Hemoptysis_14 ; Sputum _15 ; Snoring _16   . GI: Vomiting_17 ; Dysphagia_18 ; Melena_19 ; Hematochezia _20 ; Heartburn_21 ; Abdominal pain _22 ; Constipation _23 ; Diarrhea _24 ; BRBPR _25  Nausea [Y] . GU: Hematuria_26 ; Dysuria _27 ; Nocturia_28   . Vascular: Pain in legs with walking _29 ; Pain in feet with lying flat _30 ; Non-healing sores _31 ; Stroke _32 ; TIA _33 ; Slurred speech _34 ;  . Neuro: Headaches_35 ; Vertigo_36 ; Seizures_37 ; Paresthesias_38 ;Blurred vision _39 ; Diplopia _40 ; Vision changes _41   . Ortho/Skin: Arthritis _42 ; Joint pain _43 ; Muscle pain _44 ; Joint swelling _45 ; Back Pain _46 ; Rash _47   . Psych: Depression_48 ; Anxiety_49   . Heme: Bleeding problems _50 ; Clotting disorders _51 ; Anemia _52   . Endocrine: Diabetes _53 ; Thyroid dysfunction_54   Home Medications Prior to Admission medications   Medication Sig Start Date End Date Taking? Authorizing Provider  albuterol (PROVENTIL) (2.5 MG/3ML) 0.083% nebulizer solution Take  3 mLs (2.5 mg total) by nebulization every 4 (four) hours as needed for wheezing or shortness of breath. 06/21/20  Yes Tanda Rockers, MD  albuterol (VENTOLIN HFA) 108 (90 Base) MCG/ACT inhaler Inhale 2 puffs into the lungs every 6 (six) hours as needed for wheezing or shortness of breath.   Yes [provider]  amiodarone (PACERONE) 200 MG tablet Take 0.5 tablets (100 mg total) by mouth daily. 08/28/20  Yes Larey Dresser, MD  ascorbic acid (VITAMIN C) 500 MG tablet Take 500 mg by mouth 3 (three) times a week. Monday Wednesday Friday   Yes [provider]  Budeson-Glycopyrrol-Formoterol (BREZTRI AEROSPHERE) 160-9-4.8 MCG/ACT AERO Inhale 2 puffs into the lungs 2 (two) times daily. 06/21/20  Yes Tanda Rockers, MD  carbamide peroxide (DEBROX) 6.5 % OTIC solution Place 5-10 drops into both ears 3 (three) times daily.   Yes [provider]  Carboxymethylcellulose Sod PF 0.25 % SOLN Place 1 drop into both eyes 4 (four) times daily.  Yes [provider]  cetirizine (ZYRTEC) 10 MG tablet Take 10 mg by mouth daily as needed for allergies.    Yes [provider]  Cholecalciferol 50 MCG (2000 UT) TABS Take 2,000 Units by mouth daily.   Yes [provider]  diazepam (VALIUM) 10 MG tablet Take 10 mg by mouth 3 (three) times daily.   Yes [provider]  Ensure Max Protein (ENSURE MAX PROTEIN) LIQD Take 330 mLs (11 oz total) by mouth daily. 08/29/19  Yes Hosie Poisson, MD  famotidine (PEPCID) 20 MG tablet Take 1 tablet (20 mg total) by mouth 2 (two) times daily. 10/27/19  Yes Larey Dresser, MD  FEROSUL 325 (65 Fe) MG tablet Take 325 mg by mouth 3 (three) times a week. Monday Wednesday Friday 03/31/19  Yes [provider]  fluticasone (FLONASE) 50 MCG/ACT nasal spray Place 2 sprays into both nostrils daily as needed for allergies.    Yes [provider]  gabapentin (NEURONTIN) 300 MG capsule Take 1 capsule (300 mg total) by mouth  2 (two) times daily. Patient taking differently: Take 300 mg by mouth daily.  06/13/19  Yes Swayze, Ava, DO  hydrocortisone (ANUSOL-HC) 2.5 % rectal cream Place 1 application rectally 2 (two) times daily as needed for hemorrhoids or itching.    Yes [provider]  hydrocortisone 1 % lotion Apply 1 application topically See admin instructions. Apply small amount to back twice daily for itchy rash as needed   Yes [provider]  insulin aspart (NOVOLOG) 100 UNIT/ML injection Inject 4 Units into the skin 3 (three) times daily with meals. 03/31/19  Yes Hall, Carole N, DO  insulin aspart protamine - aspart (NOVOLOG MIX 70/30 FLEXPEN) (70-30) 100 UNIT/ML FlexPen Inject 0.1 mLs (10 Units total) into the skin 2 (two) times daily. 03/31/19  Yes Hall, Carole N, DO  latanoprost (XALATAN) 0.005 % ophthalmic solution Place 1 drop into both eyes at bedtime as needed (dry eyes).    Yes [provider]  metolazone (ZAROXOLYN) 2.5 MG tablet Take 1 tablet (2.5 mg total) by mouth once a week. Every Wednesday Patient taking differently: Take 2.5 mg by mouth once a week. Take on Wednesdays 08/28/20  Yes Larey Dresser, MD  oxybutynin (DITROPAN) 5 MG tablet Take 1 tablet (5 mg total) by mouth 4 (four) times daily. Patient taking differently: Take 5 mg by mouth 4 (four) times daily as needed for bladder spasms.  06/13/19  Yes Swayze, Ava, DO  oxyCODONE-acetaminophen (PERCOCET) 10-325 MG tablet Take 1 tablet by mouth every 6 (six) hours as needed for pain.  05/16/19  Yes [provider]  pantoprazole (PROTONIX) 40 MG tablet Take 40 mg by mouth 2 (two) times daily as needed (heartburn).    Yes [provider]  polyethylene glycol (MIRALAX / GLYCOLAX) 17 g packet Take 17 g by mouth daily as needed. Patient taking differently: Take 17 g by mouth daily as needed for mild constipation.  08/29/19  Yes Hosie Poisson, MD  potassium chloride SA (KLOR-CON) 20 MEQ tablet Take 4 tablets (80  mEq total) by mouth daily. With additional 25mq when you take Metolazone. Patient taking differently: Take 20-60 mEq by mouth See admin instructions. 611m twice daily and an additional 2079mwhen taking metolazone on Wednesdays 07/27/20  Yes McLLarey DresserD  rosuvastatin (CRESTOR) 40 MG tablet Take 20 mg by mouth daily.   Yes [provider]  senna-docusate (SENOKOT-S) 8.6-50 MG tablet Take 1 tablet by mouth  at bedtime as needed for mild constipation. 08/29/19  Yes Hosie Poisson, MD  sodium chloride (OCEAN) 0.65 % SOLN nasal spray Place 2 sprays into both nostrils as needed for congestion.    Yes [provider]  tamsulosin (FLOMAX) 0.4 MG CAPS capsule Take 0.4 mg by mouth daily as needed (urination).    Yes [provider]  terbinafine (LAMISIL) 1 % cream Apply 1 application topically daily. To groin for 4 weeks daily with zinc oxide over it   Yes [provider]  torsemide (DEMADEX) 20 MG tablet Take 4 tablets (80 mg total) by mouth 2 (two) times daily. 11/18/19  Yes Larey Dresser, MD  zinc oxide 20 % ointment Apply 1 application topically 2 (two) times daily as needed for irritation (rash).    Yes [provider]  apixaban (ELIQUIS) 5 MG TABS tablet Take 1 tablet (5 mg total) by mouth 2 (two) times daily. Patient taking differently: Take 2.5 mg by mouth 2 (two) times daily.  08/29/19   Hosie Poisson, MD  Insulin Syringe 27G X 1/2" 0.5 ML MISC 100 Syringes by Does not apply route 3 (three) times daily. 08/29/19   Hosie Poisson, MD  OXYGEN Inhale 3 L into the lungs continuous.     [provider]  predniSONE (DELTASONE) 10 MG tablet Take 4 each am x 2 days,   2 each am x 2 days,  1 each am x 2 days and stop Patient not taking: Reported on 09/26/2020 09/19/20   Tanda Rockers, MD  PRESCRIPTION MEDICATION Cpap    [provider]    Past Medical History: Past Medical History:  Diagnosis Date  . Atrial flutter ([place])    a.  recurrent AFlutter with RVR;  b. Amiodarone Rx started 4/16  . CAD (coronary artery disease)    a. LHC 1/16:  mLAD diffuse disease, pLCx mild disease, dLCx with disease but too small for PCI, RCA ok, EF 25-30%  . Chronic pain   . Chronic systolic CHF (congestive heart failure) ([place])   . COPD (chronic obstructive pulmonary disease) ([place])   . Diabetes mellitus without complication ([place])   . Ex-smoker 11/2017  . Hypercholesteremia   . Hypertension   . NICM (nonischemic cardiomyopathy) ([place])    a.dx 2016. b. 2D echo 06/2016 - Last echo 07/01/16: mod dilated LV, mod LVH, EF 25-30%, mild-mod MR, sev LAE, mild-mod reduced RV systolic function, mild-mod TR, PASP 63mHG.  .Marland KitchenPAF (paroxysmal atrial fibrillation) ([facility])    On amio - ot a candidate for flecainide due to cardiomyopathy, not a candidate for Tikosyn due to prolonged QT, and felt to be a poor candidate for ablation given left atrial size.  . [place]   . Tobacco abuse     Past Surgical History: Past Surgical History:  Procedure Laterality Date  . BIOPSY  03/11/2019   Procedure: BIOPSY;  Surgeon: CLavena Bullion DO;  [facility]  Service: Gastroenterology;;  . BIOPSY  03/15/2019   Procedure: BIOPSY;  Surgeon: MIrving Copas, MD;  [place] [facility]  Service: Gastroenterology;;  . CARDIOVERSION N/A 07/30/2018   Procedure: CARDIOVERSION;  Surgeon: MLarey Dresser MD;  [facility]  Service: Cardiovascular;  Laterality: N/A;  . COLONOSCOPY N/A 03/11/2019   Procedure: COLONOSCOPY;  Surgeon: CLavena Bullion DO;  [place] [facility]  Service: Gastroenterology;  Laterality: N/A;  . ENTEROSCOPY N/A 03/15/2019   Procedure: ENTEROSCOPY;  Surgeon: MRush LandmarkGTelford Nab, MD;  [facility];  Service: Gastroenterology;  Laterality: N/A;  . ESOPHAGOGASTRODUODENOSCOPY Left 03/11/2019   Procedure: ESOPHAGOGASTRODUODENOSCOPY (EGD);  Surgeon: Lavena Bullion, DO;  Location: [facility]  ENDOSCOPY;  Service: Gastroenterology;  Laterality: Left;  . LEFT AND RIGHT HEART CATHETERIZATION WITH CORONARY ANGIOGRAM N/A 11/06/2014   Procedure: LEFT AND RIGHT HEART CATHETERIZATION WITH CORONARY ANGIOGRAM;  Surgeon: Jettie Booze, MD;  Location: [facility] CATH LAB;  Service: Cardiovascular;  Laterality: N/A;  . RIGHT/LEFT HEART CATH AND CORONARY ANGIOGRAPHY N/A 12/06/2018   Procedure: RIGHT/LEFT HEART CATH AND CORONARY ANGIOGRAPHY;  Surgeon: Larey Dresser, MD;  Location: [facility] CV LAB;  Service: Cardiovascular;  Laterality: N/A;  . SPLENECTOMY    . SUBMUCOSAL TATTOO INJECTION  03/15/2019   Procedure: SUBMUCOSAL TATTOO INJECTION;  Surgeon: Rush Landmark Telford Nab., MD;  Location: [facility] ENDOSCOPY;  Service: Gastroenterology;;    Family History: Family History  Problem Relation Age of Onset  . Heart disease Mother   . Hypertension Mother   . Heart failure Mother   . Heart disease Father   . Hypertension Sister   . Heart attack Neg Hx   . Stroke Neg Hx   . Colon cancer Neg Hx   . Esophageal cancer Neg Hx     Social History: Social History   Socioeconomic History  . Marital status: Married    Spouse name: Not on file  . Number of children: Not on file  . Years of education: Not on file  . Highest education level: Not on file  Occupational History  . Not on file  Tobacco Use  . Smoking status: Former Smoker    Packs/day: 1.00    Years: 34.00    Pack years: 34.00    Types: Cigarettes    Quit date: 11/30/2018    Years since quitting: 1.8  . Smokeless tobacco: Never Used  Vaping Use  . Vaping Use: Never used  Substance and Sexual Activity  . Alcohol use: No    Alcohol/week: 0.0 standard drinks  . Drug use: No  . Sexual activity: Not on file  Other Topics Concern  . Not on file  Social History Narrative   ** Merged History Encounter **       Social Determinants of Health   Financial Resource Strain:   . Difficulty of Paying Living Expenses: Not on file  Food  Insecurity:   . Worried About Charity fundraiser in the Last Year: Not on file  . Ran Out of Food in the Last Year: Not on file  Transportation Needs:   . Lack of Transportation (Medical): Not on file  . Lack of Transportation (Non-Medical): Not on file  Physical Activity:   . Days of Exercise per Week: Not on file  . Minutes of Exercise per Session: Not on file  Stress:   . Feeling of Stress : Not on file  Social Connections:   . Frequency of Communication with Friends and Family: Not on file  . Frequency of Social Gatherings with Friends and Family: Not on file  . Attends Religious Services: Not on file  . Active Member of Clubs or Organizations: Not on file  . Attends Archivist Meetings: Not on file  . Marital Status: Not on file    Allergies:  Allergies  Allergen Reactions  . Bidil [Isosorb Dinitrate-Hydralazine] Shortness Of Breath  . Benazepril Nausea And Vomiting  . Brimonidine     Other reaction(s): Other (See Comments)  . Metformin     Other reaction(s): Cramps (ALLERGY/intolerance), Other (  See Comments)  . Morphine Rash    Objective:    Vital Signs:   Temp:  [98.2 F (36.8 C)-101.4 F (38.6 C)] 98.2 F (36.8 C) (12/02 1200) Pulse Rate:  [74-102] 88 (12/02 1200) Resp:  [21-30] 23 (12/02 1200) BP: (105-121)/(60-73) 107/70 (12/02 1200) SpO2:  [89 %-96 %] 89 % (12/02 1200) Weight:  [102.2 kg] 102.2 kg (12/02 0433) Last BM Date: 10/03/20  Weight change: Filed Weights   10/02/20 0449 10/03/20 0500 10/04/20 0433  Weight: 106.3 kg 104 kg 102.2 kg    Intake/Output:   Intake/Output Summary (Last 24 hours) at 10/04/2020 1524 Last data filed at 10/04/2020 1200 Gross per 24 hour  Intake 1205 ml  Output 2550 ml  Net -1345 ml      Physical Exam    General:  Chronically ill/ fatigue appearing elderly AAM wearing BiPAP HEENT: normal Neck: supple. JVP elevated to jaw . Carotids 2+ bilat; no bruits. No lymphadenopathy or thyromegaly  appreciated. Cor: PMI nondisplaced. Regular rate & rhythm. No rubs, gallops or murmurs. Lungs: decreased BS at the bases w/ faint expiratory wheezing bilaterally  Abdomen: obese but soft, nontender, nondistended. No hepatosplenomegaly. No bruits or masses. Good bowel sounds. Extremities: no cyanosis, clubbing, rash, trace bilateral LE edema Neuro: alert & orientedx3, cranial nerves grossly intact. moves all 4 extremities w/o difficulty. Affect pleasant   Telemetry   NSR w/ PACs 80s   EKG    EKG 12/1 Afib w/ RVR 101 bpm   Labs   Basic Metabolic Panel: Recent Labs  Lab 09/29/20 0103 09/29/20 1416 09/30/20 0025 10/01/20 1609 10/02/20 0036 10/02/20 0036 10/02/20 1219 10/02/20 1219 10/03/20 0218 10/03/20 1555 10/04/20 0049  NA 143   < >   < >  --  146*  --  142  --  145 144 141  K 2.9*   < >   < >  --  2.2*  --  2.2*  --  2.8* 3.4* 3.4*  CL 95*   < >   < >  --  87*  --  84*  --  84* 84* 86*  CO2 32   < >   < >  --  40*  --  39*  --  41* 40* 33*  GLUCOSE 130*   < >   < >  --  120*  --  132*  --  125* 141* 134*  BUN 61*   < >   < >  --  67*  --  69*  --  75* 85* 88*  CREATININE 3.95*   < >   < >  --  3.11*  --  3.06*  --  2.97* 3.25* 3.10*  CALCIUM 8.1*   < >   < >  --  8.2*   < > 8.1*   < > 8.9 9.0 8.7*  MG 2.3  --   --  2.0  --   --  2.0  --   --   --   --   PHOS  --   --   --  3.5  --   --   --   --   --   --   --    < > = values in this interval not displayed.    Liver Function Tests: No results for input(s): AST, ALT, ALKPHOS, BILITOT, PROT, ALBUMIN in the last 168 hours. No results for input(s): LIPASE, AMYLASE in the last 168 hours. No results for input(s): AMMONIA  in the last 168 hours.  CBC: Recent Labs  Lab 09/28/20 0023 09/30/20 0025  WBC 6.5 6.6  NEUTROABS 4.6 5.3  HGB 10.3* 10.9*  HCT 36.0* 36.8*  MCV 89.6 86.2  PLT 312 278    Cardiac Enzymes: No results for input(s): CKTOTAL, CKMB, CKMBINDEX, TROPONINI in the last 168 hours.  BNP: BNP (last 3  results) Recent Labs    12/05/19 1541 09/25/20 1401 10/01/20 1609  BNP 1,175.8* 3,275.6* 3,013.5*    ProBNP (last 3 results) No results for input(s): PROBNP in the last 8760 hours.   CBG: Recent Labs  Lab 10/03/20 2000 10/03/20 2354 10/04/20 0329 10/04/20 0748 10/04/20 1210  GLUCAP 137* 126* 141* 113* 109*    Coagulation Studies: No results for input(s): LABPROT, INR in the last 72 hours.   Imaging   US RENAL  Result Date: 10/03/2020 CLINICAL DATA:  Acute renal insufficiency EXAM: RENAL / URINARY TRACT ULTRASOUND COMPLETE COMPARISON:  08/28/2019, 01/03/2019 FINDINGS: Right Kidney: Renal measurements: 10.9 x 5.2 x 4.5 cm = volume: 133.3 mL. Echogenicity within normal limits. No mass or hydronephrosis visualized. Left Kidney: Renal measurements: 10.4 x 5.0 x 4.6 cm = volume: 126.3 mL. Echogenicity within normal limits. No mass or hydronephrosis visualized. Bladder: Appears normal for degree of bladder distention. Other: None. IMPRESSION: 1. Unremarkable renal ultrasound. Electronically Signed   By: Randa Ngo M.D.   On: 10/03/2020 19:33      Medications:     Current Medications: . amiodarone  100 mg Oral Daily  . apixaban  5 mg Oral BID  . diazepam  2 mg Oral BID  . insulin aspart  0-9 Units Subcutaneous Q4H  . sodium chloride flush  3 mL Intravenous Q12H     Infusions: . sodium chloride Stopped (10/02/20 1029)  . furosemide 120 mg (10/04/20 0848)     Assessment/Plan   1. Acute on Chronic systolic => diastolic CHF with prominent RV failure/ Severe Pulmonary HTN - likely related to COPD/OSA/OHS. H/o poor compliance w/ home O2 and BiPAP - TEE in 3/20 with EF 40-45%, severely dilated RV with moderately decreased systolic function. However, repeat echo in 9/20 showed EF >65% with moderate RV dilation/mildly decreased RV systolic function, and echo in 10/20 showed EF 60-65% with severe RV dilation/severe RV dysfunction.  - Now admitted w/ acute on chronic  hypoxic respiratory failure and marked fluid overload 2/2 a/c CHF. Admit BNP 3,275. CXR w/ pulmonary edema. Good diuresis thus far w/ IV Lasix. Net I/Os - 15L. Wt down ~40 lb.  - Echo this admit  EF 50-55%, RV severely reduced w/ severely elevated RV systolic pressure, 68 mmHg. PA severely dilated.  - breathing improved but complains of nausea - repeat HFTs - nephrology has been managing diuretics, now off IV lasix, back on PO torsemide 40 mg daily (was on 80 mg bid PTA) - may benefit from sildenafil - no dig w/ CKD - needs to improve compliance w/ home O2 and BiPAP  - consider palliative care consult to discuss [place]   2. Acute Hypoxic Hypercarbic Respiratory failure - 2/2 a/c CHF + COPD/OSA/OHS w/ poor compliance w/ home O2 and BiPAP - required NRB on admit - improved w/ diuresis and oxygenation  - CO2 improved, down from 41>>33 - continue daytime O2 and BiPAP during sleep, d/w pt and wife importance of strict compliance when he returns home   3. AKI on Stage 4 CKD  - followed by nephrology - baseline SCr ~3.6, rose to 4.0 this admit -  improved, down to 3.2 today   4.Atrial fibrillation: Paroxysmal. He has history of CVA. He had DCCV in 3/20 and again in 5/20. He failed amiodarone in the past and cannot take Tikosyn due to baseline prolonged QT interval.  However, he was restarted on amiodarone at 10/20 admission - Had Afib w/ RVR this admit, now back in NSR. Monitor on tele  - Continue apixaban 5 mg bid.   - Poor ablation candidate with LA size - Continue amiodarone 100 mg daily.   - supp K (3.4)  5. COPD:No longer smokes. Continue homeoxygen. CT chest showed emphysema. - continue daytime O2/Bipap  - PCCM following   6.OHS/OSA: Continue 3 L oxygen during the day and BIPAP at night.   7.CAD: Nonobstructive disease in 2/20. Denies CP  - no ASA due to Eliquis  - statin not ordered on admit, plan to resume if HFTs ok   Length of Stay: [address] PA-C   10/04/2020, 3:24 PM  Advanced Heart Failure Team Pager [phone] (M-F; 7a - 4p)  Please contact [facility] for night-coverage after hours (4p -7a ) and weekends on [email or web address]  Patient seen and examined with the above-signed Advanced Practice Provider and/or Housestaff. I personally reviewed laboratory data, imaging studies and relevant notes. I independently examined the patient and formulated the important aspects of the plan. I have edited the note to reflect any of my changes or salient points. I have personally discussed the plan with the patient and/or family.  74 y/o with PAH and severe RV failure, PAF, CKD4 admitted with recurrent volume overload, respiratory failure and AKI.  Has diuresed 40 pounds and weight now sdown 25 pounds under previous clinic weight. Creatinine peaked at 4 but now also down to baseline. Echo unchanged.   Feels weak. Denies SOB.   General:  Weak appearing. No resp difficulty HEENT: normal Neck: supple. JVP 9-10 Carotids 2+ bilat; no bruits. No lymphadenopathy or thryomegaly appreciated. Cor: PMI nondisplaced. Regular rate & rhythm. 2/6 TR Lungs: clear Abdomen: obese soft, nontender, nondistended. No hepatosplenomegaly. No bruits or masses. Good bowel sounds. Extremities: no cyanosis, clubbing, rash, edema Neuro: alert & orientedx3, cranial nerves grossly intact. moves all 4 extremities w/o difficulty. Affect pleasant  His volume status actually looks as good as I have seen it in a long time. From cardiac perspective I suspect this is about as good as we can get him. He is back on po torsemide 40 daily. Will likely need to increase as he was on 80 bid at home + weekly metolazone. If weight climbing in am would increase torsemide to 80 daily and follow. Will defer to Renal for now.   Glori Bickers, MD  6:52 PM

## 2020-10-04 NOTE — Progress Notes (Signed)
RT tried to place Bipap on pt and he refused. RN also attempted to discuss placing on Bipap but he still refused.

## 2020-10-04 NOTE — Plan of Care (Signed)

## 2020-10-04 NOTE — Progress Notes (Signed)
NAME:  Nicholas Caldwell, MRN:  [id number], DOB:  Oct 18, 1946, LOS: 9 ADMISSION DATE:  09/25/2020, CONSULTATION DATE:  11/29 REFERRING MD:  Karie Kirks  CHIEF COMPLAINT:  Acute on chronic respiratory failure  Brief History   74 year old male with history of COPD and right-sided congestive heart failure who is being managed for acute on chronic hypoxemic/hypercapnic respiratory failure.  Of note, on 3-4 L [place] at home.   History of present illness   Patient follows with Dr Melvyn Novas as outpatient.  5 days prior to presentation on 11/23, patient was given steroids with no improvement and therefore sought further treatment in the ED.  He was found to have AKI and requiring Bipap for respiratory failure. BNP 3275.  He was aggressively diuresed and able to be liberated from [place]. Last seen by PCCM on 11/24.  Patient has had continued diuresis this admission with net negative fluid balance 8 L.  Most recent CXR on 11/23 with persistent pulmonary edema.  On exam, he has continued lower extremity edema and abdominal distention which is likely due to volume.  He is remained afebrile with no leukocytosis.   Past Medical History  Heart failure pAfib on Eliquis DM, type II PEA arrest in 2020 Normocytic anemia Chronic pain Morbid obesity  Significant Hospital Events   11/23 Presented to ED  Consults:  Pulmonary Critical Care   Procedures:    Significant Diagnostic Tests:  ECHO 11/24:  LVEF 50-55%, RV is severely reduced with RVSP: 68.  Right atrial severely dilated.  Trivial mitral regurgitation. Tricuspid valve regurgitation is moderate to severe.  Aortic valve not visualized.   Micro Data:    Antimicrobials:    Interim history/subjective:  Feels better today, less dyspnea.  Significant diuresis.  Overall O2 requirements have decreased  Objective   Blood pressure 107/70, pulse 88, temperature 98.2 F (36.8 C), temperature source Oral, resp. rate (!) 23, height 6' (1.829 m), weight 102.2 kg, SpO2  (!) 89 %.        Intake/Output Summary (Last 24 hours) at 10/04/2020 1604 Last data filed at 10/04/2020 1200 Gross per 24 hour  Intake 1205 ml  Output 2550 ml  Net -1345 ml   Filed Weights   10/02/20 0449 10/03/20 0500 10/04/20 0433  Weight: 106.3 kg 104 kg 102.2 kg    Examination: General: brighter and more interactive. HENT: [place]/AT Lungs: diminished at bases, symmetric expansion, no increased work of breathing  Cardiovascular: S1/S1. No murmur.  lower extremity edema has resolved., warm and well perfused Abdomen: distended, soft, + bowel sounds Extremities: no deformities or rashes Neuro:answers questions with prompting, follows commands in all extremities.   Resolved Hospital Problem list     Assessment & Plan:  Acute on chronic hypercarbic/hypoxic respiratory failure in setting of COPD and RV dysfunction -No evidence of COPD, AE. Persistent hypercarbia multifactorial with volume overload and deconditioning. Suspect he has ongoing volume removal needed.  He continues to have edema and abdominal distention c/w volume overload.  Repeat BNP.  Add metolazone to current lasix dose. Change NRB to simple mask or [place]. Goal SpO2 88-90%.  Continue to wear Bipap at night and when sleeping. PT/OT.  Would also recommend limiting sedating medications and reducing valium if possible.   Hypokalemia -replace potassium.  Check Mg and Phos.  - Consider holding metolazone this evening to allow potassium repletion to catch up.  CKD,  -Baseline creatinine 2.9-3.5. Creatinine has been stable with diuresis and negative fluid balance, continue diuresis.   I think he  is now close to dry weight would recommend stopping IV furosemide and transitioning to oral maintenance diuretics. Still has significant underlying lung disease and do expect ongoing oxygen requirements. Keep sats 88-92%. Agree with palliative care consultation.  Please re-consult if questions arise.  Labs   CBC: Recent Labs  Lab  09/28/20 0023 09/30/20 0025  WBC 6.5 6.6  NEUTROABS 4.6 5.3  HGB 10.3* 10.9*  HCT 36.0* 36.8*  MCV 89.6 86.2  PLT 312 250    Basic Metabolic Panel: Recent Labs  Lab 09/29/20 0103 09/29/20 1416 09/30/20 0025 10/01/20 1609 10/02/20 0036 10/02/20 1219 10/03/20 0218 10/03/20 1555 10/04/20 0049  NA 143   < >   < >  --  146* 142 145 144 141  K 2.9*   < >   < >  --  2.2* 2.2* 2.8* 3.4* 3.4*  CL 95*   < >   < >  --  87* 84* 84* 84* 86*  CO2 32   < >   < >  --  40* 39* 41* 40* 33*  GLUCOSE 130*   < >   < >  --  120* 132* 125* 141* 134*  BUN 61*   < >   < >  --  67* 69* 75* 85* 88*  CREATININE 3.95*   < >   < >  --  3.11* 3.06* 2.97* 3.25* 3.10*  CALCIUM 8.1*   < >   < >  --  8.2* 8.1* 8.9 9.0 8.7*  MG 2.3  --   --  2.0  --  2.0  --   --   --   PHOS  --   --   --  3.5  --   --   --   --   --    < > = values in this interval not displayed.   GFR: Estimated Creatinine Clearance: 26.6 mL/min (A) (by C-G formula based on SCr of 3.1 mg/dL (H)). Recent Labs  Lab 09/28/20 0023 09/30/20 0025  PROCALCITON 0.33  --   WBC 6.5 6.6    Liver Function Tests: No results for input(s): AST, ALT, ALKPHOS, BILITOT, PROT, ALBUMIN in the last 168 hours. No results for input(s): LIPASE, AMYLASE in the last 168 hours. No results for input(s): AMMONIA in the last 168 hours.  ABG    Component Value Date/Time   PHART 7.349 (L) 09/29/2020 0859   PCO2ART 63.9 (H) 09/29/2020 0859   PO2ART 69.1 (L) 09/29/2020 0859   HCO3 34.4 (H) 09/29/2020 0859   TCO2 29 09/25/2020 1837   ACIDBASEDEF 0.4 09/26/2020 0618   O2SAT 92.8 09/29/2020 0859     Coagulation Profile: No results for input(s): INR, PROTIME in the last 168 hours.  Cardiac Enzymes: No results for input(s): CKTOTAL, CKMB, CKMBINDEX, TROPONINI in the last 168 hours.  HbA1C: Hgb A1c MFr Bld  Date/Time Value Ref Range Status  09/25/2020 10:03 PM 6.7 (H) 4.8 - 5.6 % Final    Comment:    (NOTE) Pre diabetes:           5.7%-6.4%  Diabetes:              >6.4%  Glycemic control for   <7.0% adults with diabetes   08/21/2019 12:17 AM 7.4 (H) 4.8 - 5.6 % Final    Comment:    (NOTE)         Prediabetes: 5.7 - 6.4         Diabetes: >6.4  Glycemic control for adults with diabetes: <7.0     CBG: Recent Labs  Lab 10/03/20 2000 10/03/20 2354 10/04/20 0329 10/04/20 0748 10/04/20 1210  GLUCAP 137* 126* 141* 113* 109*   [name], MD [facility] ICU Physician [place]  Pager: [phone] Or Epic Secure Chat After hours: [phone].  10/04/2020, 4:04 PM

## 2020-10-04 NOTE — Progress Notes (Signed)
Physical Therapy Treatment Patient Details Name: Nicholas Caldwell MRN: [id number] DOB: Apr 26, 1946 Today's Date: 10/04/2020    History of Present Illness Pt is a 74 y.o. male with medical history of NISCM, systolic CHF and mod RV failure, DM2, CKD 4, PAF s/p DCCV, COPD on home O2, OHS/ OSA on 3 L O2 and CPAP & CAD.  Pt admitted with acute on chronic hypoxic and hypercapneic resp failure requiring bipap.    PT Comments    Pt admitted with above diagnosis. Pt only did bed level exercises as he was 87% on 10 L on arrival.  Pt desat to 84%  With exercises only and needed frequent rest breaks to incr sats >87%.  Given that pt O2 sats low to mid 80's did not sit pt on EOB.   Pt currently with functional limitations due to balance and endurance deficits. Pt will benefit from skilled PT to increase their independence and safety with mobility to allow discharge to the venue listed below.     Follow Up Recommendations  SNF     Equipment Recommendations  Other (comment) (hoyer if home but needs further assessment next venue)    Recommendations for Other Services       Precautions / Restrictions Precautions Precautions: Fall Precaution Comments: O2 sats Restrictions Weight Bearing Restrictions: No    Mobility  Bed Mobility Overal bed mobility: Needs Assistance Bed Mobility: Supine to Sit;Sit to Supine;Rolling Rolling: Total assist;+2 for physical assistance;+2 for safety/equipment;Max assist         General bed mobility comments: Pt did not assist with rolling.    Transfers                    Ambulation/Gait                 Stairs             Wheelchair Mobility    Modified Rankin (Stroke Patients Only)       Balance                                            Cognition Arousal/Alertness: Awake/alert Behavior During Therapy: Flat affect Overall Cognitive Status: Difficult to assess                                  General Comments: Pt either grunting to respond or providing one word answers, extremely self limiting during session, making it difficult to assess cognition      Exercises General Exercises - Upper Extremity Shoulder Flexion: AROM;Both;10 reps;Supine Shoulder Extension: AROM;Right;10 reps;Supine Shoulder Horizontal ADduction: AROM;Both;10 reps;Supine Elbow Flexion: AROM;Both;10 reps;Supine Elbow Extension: AROM;Both;10 reps;Supine General Exercises - Lower Extremity Ankle Circles/Pumps: AROM;5 reps;Both;Supine Quad Sets: AROM;Both;5 reps;Supine Gluteal Sets: AROM;Both;10 reps;Supine Heel Slides: AAROM;Both;5 reps;Supine Hip ABduction/ADduction: AAROM;Both;5 reps;Supine Straight Leg Raises: AAROM;Both;10 reps;Supine    General Comments General comments (skin integrity, edema, etc.): O2 sats to 83-87% just with exercises on UE and LE exercise. Would recover to >90% when pt not exercising.       Pertinent Vitals/Pain Pain Assessment: No/denies pain    Home Living                      Prior Function            PT Goals (  current goals can now be found in the care plan section) Acute Rehab PT Goals Patient Stated Goal: None stated Progress towards PT goals: Not progressing toward goals - comment (limited by desaturation and self limitng )    Frequency    Min 2X/week      PT Plan Current plan remains appropriate    Co-evaluation              AM-PAC PT "6 Clicks" Mobility   Outcome Measure  Help needed turning from your back to your side while in a flat bed without using bedrails?: A Lot Help needed moving from lying on your back to sitting on the side of a flat bed without using bedrails?: Total Help needed moving to and from a bed to a chair (including a wheelchair)?: Total Help needed standing up from a chair using your arms (e.g., wheelchair or bedside chair)?: Total Help needed to walk in hospital room?: Total Help needed climbing 3-5 steps with  a railing? : Total 6 Click Score: 7    End of Session Equipment Utilized During Treatment: Oxygen Activity Tolerance: Patient limited by fatigue Patient left: in bed;with call bell/phone within reach;with bed alarm set;with family/visitor present Nurse Communication: Mobility status;Need for lift equipment PT Visit Diagnosis: Other abnormalities of gait and mobility (R26.89);Muscle weakness (generalized) (M62.81)     Time: 6195-0932 PT Time Calculation (min) (ACUTE ONLY): 15 min  Charges:  $Therapeutic Exercise: 8-22 mins                     Jed Kutch W,PT Acute Rehabilitation Services Pager:  [phone]  Office:  [place] 10/04/2020, 2:19 PM

## 2020-10-04 NOTE — Progress Notes (Addendum)
Patient ID: Nicholas Caldwell, male   DOB: [birth year], 74 y.o.   MRN: [id number] S: Complains of feeling "sick on my stomach" O:BP 119/66 (BP Location: Left Arm)   Pulse 85   Temp 98.2 F (36.8 C) (Oral)   Resp (!) 21   Ht 6' (1.829 m)   Wt 102.2 kg   SpO2 94%   BMI 30.56 kg/m   Intake/Output Summary (Last 24 hours) at 10/04/2020 0933 Last data filed at 10/04/2020 0900 Gross per 24 hour  Intake 1205 ml  Output 2350 ml  Net -1145 ml   Intake/Output: I/O last 3 completed shifts: In: 1150 [P.O.:930; I.V.:96; IV Piggyback:124] Out: 4150 [Urine:4150]  Intake/Output this shift:  Total I/O In: 240 [P.O.:240] Out: -  Weight change: -1.8 kg Gen: NAD, fatigued, chronically ill-appearing CVS: RRR Resp: diminished bs bilaterally, poor inspiratory effort Abd: distended, +BS, soft Ext: trace presacral edema  Recent Labs  Lab 09/29/20 2100 09/30/20 0025 10/01/20 1609 10/02/20 0036 10/02/20 1219 10/03/20 0218 10/03/20 1555 10/04/20 0049  NA 145 144  --  146* 142 145 144 141  K 4.2 3.6  --  2.2* 2.2* 2.8* 3.4* 3.4*  CL 94* 95*  --  87* 84* 84* 84* 86*  CO2 34* 33*  --  40* 39* 41* 40* 33*  GLUCOSE 133* 143*  --  120* 132* 125* 141* 134*  BUN 58* 57*  --  67* 69* 75* 85* 88*  CREATININE 3.63* 3.69*  --  3.11* 3.06* 2.97* 3.25* 3.10*  CALCIUM 8.2* 8.1*  --  8.2* 8.1* 8.9 9.0 8.7*  PHOS  --   --  3.5  --   --   --   --   --    Liver Function Tests: No results for input(s): AST, ALT, ALKPHOS, BILITOT, PROT, ALBUMIN in the last 168 hours. No results for input(s): LIPASE, AMYLASE in the last 168 hours. No results for input(s): AMMONIA in the last 168 hours. CBC: Recent Labs  Lab 09/28/20 0023 09/30/20 0025  WBC 6.5 6.6  NEUTROABS 4.6 5.3  HGB 10.3* 10.9*  HCT 36.0* 36.8*  MCV 89.6 86.2  PLT 312 278   Cardiac Enzymes: No results for input(s): CKTOTAL, CKMB, CKMBINDEX, TROPONINI in the last 168 hours. CBG: Recent Labs  Lab 10/03/20 1611 10/03/20 2000 10/03/20 2354  10/04/20 0329 10/04/20 0748  GLUCAP 150* 137* 126* 141* 113*    Iron Studies: No results for input(s): IRON, TIBC, TRANSFERRIN, FERRITIN in the last 72 hours. Studies/Results: US RENAL  Result Date: 10/03/2020 CLINICAL DATA:  Acute renal insufficiency EXAM: RENAL / URINARY TRACT ULTRASOUND COMPLETE COMPARISON:  08/28/2019, 01/03/2019 FINDINGS: Right Kidney: Renal measurements: 10.9 x 5.2 x 4.5 cm = volume: 133.3 mL. Echogenicity within normal limits. No mass or hydronephrosis visualized. Left Kidney: Renal measurements: 10.4 x 5.0 x 4.6 cm = volume: 126.3 mL. Echogenicity within normal limits. No mass or hydronephrosis visualized. Bladder: Appears normal for degree of bladder distention. Other: None. IMPRESSION: 1. Unremarkable renal ultrasound. Electronically Signed   By: Randa Ngo M.D.   On: 10/03/2020 19:33   . amiodarone  100 mg Oral Daily  . apixaban  5 mg Oral BID  . diazepam  2 mg Oral TID  . insulin aspart  0-9 Units Subcutaneous Q4H  . potassium chloride  40 mEq Oral Q4H  . sodium chloride flush  3 mL Intravenous Q12H    BMET    Component Value Date/Time   NA 141 10/04/2020 0049   K 3.4 (  L) 10/04/2020 0049   CL 86 (L) 10/04/2020 0049   CO2 33 (H) 10/04/2020 0049   GLUCOSE 134 (H) 10/04/2020 0049   BUN 88 (H) 10/04/2020 0049   CREATININE 3.10 (H) 10/04/2020 0049   CREATININE 0.76 03/24/2016 1656   CALCIUM 8.7 (L) 10/04/2020 0049   GFRNONAA 21 (L) 10/04/2020 0049   GFRAA 18 (L) 07/25/2020 1553   CBC    Component Value Date/Time   WBC 6.6 09/30/2020 0025   RBC 4.27 09/30/2020 0025   HGB 10.9 (L) 09/30/2020 0025   HCT 36.8 (L) 09/30/2020 0025   PLT 278 09/30/2020 0025   MCV 86.2 09/30/2020 0025   MCH 25.5 (L) 09/30/2020 0025   MCHC 29.6 (L) 09/30/2020 0025   RDW 19.2 (H) 09/30/2020 0025   LYMPHSABS 0.6 (L) 09/30/2020 0025   MONOABS 0.6 09/30/2020 0025   EOSABS 0.0 09/30/2020 0025   BASOSABS 0.0 09/30/2020 0025     Assessment/Plan: 1. CKDIV: Cr at  baseline around 3.6. Cr on admission was 3.54, increased up to 4.06.  Creatinine appears to be improving since admission, but rose from 2.97 to 3.1 overnight following a dose of metolazone 10 mg on 10/03/20.  1. Has been having adequate output with no issues urinating and has diuresed over 14 liters since admission 2. Would not be too aggressive with diuresis setting a goal net negative of about 2 liters per day to reduce the risk recurrent AKI. 3. Only dose metolazone on an as needed basis  4. Continue to follow UOP and daily Scr. 2. Acute hypoxic and hypercarbic respiratory failure: Appears multifactorial 2/2 OSA/OHS, acute on chronic heart failure, COPD. Has been on and off BiPAP and now only on BiPAP at night.  Pt does refuse oxygen during the day and not sure he is compliant with BiPAP 3. Acute on chronic combined systolic and diastolic CHF with RV failure.  He has diuresed well since admission and net negative over 14 liters since admission.  Weight down from 117.9 to 102 kg (went up to 122 during admission).  Poor RV function and would recommend consulting the advanced heart failure team for possible treatment options.  4. Pulmonary HTN and RV failure- unsure if this is due to his chronic lung disease or possible PE.  Unable to perform CTA due to AKI and doubt he could tolerate V/Q scan.   3. Hypokalemia: K 2.8 today, being repleated. Continue to monitor.  4. Normocytic anemia: Last Hgb 10.9, around BL of 8.8-11. No evidence of bleeding on exam.  5. Paroxysmal A fib: Currently in NSR. Per primary 6. DM: per primary 7. H/o PEA arrest: 8. Morbid obesity: 9. Chronic pain:  10. Disposition- overall prognosis is poor given biventricular failure and persistent hypoxic and hypercarbic respiratory failure.  Palliative care is following.  Donetta Potts, MD [employer] [phone]

## 2020-10-04 NOTE — Progress Notes (Signed)
PROGRESS NOTE    Nicholas Caldwell  DGL:[id number] DOB: 1946/05/13 DOA: 09/25/2020 PCP: Nicholas Milan, MD    Brief Narrative:  Nicholas Caldwell a 74 y.o.malewith medical history ofNISCM, systolic CHF and mod RV failure, DM2, CKD 4, PAF s/p DCCV, COPD on home O2, OHS/ OSA on 3 L O2 and CPAP&CAD.  Per ED notes, he has been more short of breath for about 1 wk and has more fluid on his legs.  Pulse ox was 86% on 4 L. Stayed in 80s on 6 L. Subsequently placed on a non re-breather- pulse ox has been 100%. Given 40 mg IV Lasix. CXR reveals pulmonary edema.  Pt is intermittently non-compliant with BIPAP and O2. He continues to require BIPAP with 70% O2 or NRB with 10 L O2 to maintain SaO2 in the 90's.  12/1-RT checked on Pt, pt off bipap and back on HFNC Off note, pt on 3-4L [place] at home.  12/2-Spoke to wife at bedside that pt appears very quiet, soft spoken, pt's wife stated this is actually his baseline. D/w wife about palliative care, she is agreeable.    Consultants:   pccm, nephrology  Procedures:   Antimicrobials:       Subjective: Pt is quiet. Does respond to my questions, but in soft manner voice. Good UO. Less sob. Objective: Vitals:   10/04/20 0000 10/04/20 0300 10/04/20 0433 10/04/20 0751  BP: 110/71 117/71  119/66  Pulse: 74 89  85  Resp: (!) 23 (!) 25  (!) 21  Temp: 99.7 F (37.6 C) 99.5 F (37.5 C)  98.2 F (36.8 C)  TempSrc: Axillary Oral  Oral  SpO2: 96% 95%  94%  Weight:   102.2 kg   Height:        Intake/Output Summary (Last 24 hours) at 10/04/2020 0818 Last data filed at 10/04/2020 0400 Gross per 24 hour  Intake 965 ml  Output 2350 ml  Net -1385 ml   Filed Weights   10/02/20 0449 10/03/20 0500 10/04/20 0433  Weight: 106.3 kg 104 kg 102.2 kg    Examination: Calm, lying up in bed, wife at bedside +rales scattered b/l, no wheezing Regular s1/s2 no gallop Soft benign, +bs +edema, improving Mood and affect appropriate in current  setting.   Data Reviewed: I have personally reviewed following labs and imaging studies  CBC: Recent Labs  Lab 09/28/20 0023 09/30/20 0025  WBC 6.5 6.6  NEUTROABS 4.6 5.3  HGB 10.3* 10.9*  HCT 36.0* 36.8*  MCV 89.6 86.2  PLT 312 518   Basic Metabolic Panel: Recent Labs  Lab 09/29/20 0103 09/29/20 1416 09/30/20 0025 10/01/20 1609 10/02/20 0036 10/02/20 1219 10/03/20 0218 10/03/20 1555 10/04/20 0049  NA 143   < >   < >  --  146* 142 145 144 141  K 2.9*   < >   < >  --  2.2* 2.2* 2.8* 3.4* 3.4*  CL 95*   < >   < >  --  87* 84* 84* 84* 86*  CO2 32   < >   < >  --  40* 39* 41* 40* 33*  GLUCOSE 130*   < >   < >  --  120* 132* 125* 141* 134*  BUN 61*   < >   < >  --  67* 69* 75* 85* 88*  CREATININE 3.95*   < >   < >  --  3.11* 3.06* 2.97* 3.25* 3.10*  CALCIUM 8.1*   < >   < >  --  8.2* 8.1* 8.9 9.0 8.7*  MG 2.3  --   --  2.0  --  2.0  --   --   --   PHOS  --   --   --  3.5  --   --   --   --   --    < > = values in this interval not displayed.   GFR: Estimated Creatinine Clearance: 26.6 mL/min (A) (by C-G formula based on SCr of 3.1 mg/dL (H)). Liver Function Tests: No results for input(s): AST, ALT, ALKPHOS, BILITOT, PROT, ALBUMIN in the last 168 hours. No results for input(s): LIPASE, AMYLASE in the last 168 hours. No results for input(s): AMMONIA in the last 168 hours. Coagulation Profile: No results for input(s): INR, PROTIME in the last 168 hours. Cardiac Enzymes: No results for input(s): CKTOTAL, CKMB, CKMBINDEX, TROPONINI in the last 168 hours. BNP (last 3 results) No results for input(s): PROBNP in the last 8760 hours. HbA1C: No results for input(s): HGBA1C in the last 72 hours. CBG: Recent Labs  Lab 10/03/20 1611 10/03/20 2000 10/03/20 2354 10/04/20 0329 10/04/20 0748  GLUCAP 150* 137* 126* 141* 113*   Lipid Profile: No results for input(s): CHOL, HDL, LDLCALC, TRIG, CHOLHDL, LDLDIRECT in the last 72 hours. Thyroid Function Tests: No results for  input(s): TSH, T4TOTAL, FREET4, T3FREE, THYROIDAB in the last 72 hours. Anemia Panel: No results for input(s): VITAMINB12, FOLATE, FERRITIN, TIBC, IRON, RETICCTPCT in the last 72 hours. Sepsis Labs: Recent Labs  Lab 09/28/20 0023  PROCALCITON 0.33    Recent Results (from the past 240 hour(s))  Respiratory Panel by RT PCR (Flu A&B, Covid) - Nasopharyngeal Swab     Status: None   Collection Time: 09/25/20  2:03 PM   Specimen: Nasopharyngeal Swab; Nasopharyngeal(NP) swabs in vial transport medium  Result Value Ref Range Status   SARS Coronavirus 2 by RT PCR NEGATIVE NEGATIVE Final    Comment: (NOTE) SARS-CoV-2 target nucleic acids are NOT DETECTED.  The SARS-CoV-2 RNA is generally detectable in upper respiratoy specimens during the acute phase of infection. The lowest concentration of SARS-CoV-2 viral copies this assay can detect is 131 copies/mL. A negative result does not preclude SARS-Cov-2 infection and should not be used as the sole basis for treatment or other patient management decisions. A negative result may occur with  improper specimen collection/handling, submission of specimen other than nasopharyngeal swab, presence of viral mutation(s) within the areas targeted by this assay, and inadequate number of viral copies (<131 copies/mL). A negative result must be combined with clinical observations, patient history, and epidemiological information. The expected result is Negative.  Fact Sheet for Patients:  PinkCheek.be  Fact Sheet for Healthcare Providers:  GravelBags.it  This test is no t yet approved or cleared by the [place] FDA and  has been authorized for detection and/or diagnosis of SARS-CoV-2 by FDA under an Emergency Use Authorization (EUA). This EUA will remain  in effect (meaning this test can be used) for the duration of the COVID-19 declaration under Section 564(b)(1) of the Act, 21  U.S.C. section 360bbb-3(b)(1), unless the authorization is terminated or revoked sooner.     Influenza A by PCR NEGATIVE NEGATIVE Final   Influenza B by PCR NEGATIVE NEGATIVE Final    Comment: (NOTE) The Xpert Xpress SARS-CoV-2/FLU/RSV assay is intended as an aid in  the diagnosis of influenza from Nasopharyngeal swab specimens and  should not be used as a sole basis for treatment. Nasal washings and  aspirates  are unacceptable for Xpert Xpress SARS-CoV-2/FLU/RSV  testing.  Fact Sheet for Patients: PinkCheek.be  Fact Sheet for Healthcare Providers: GravelBags.it  This test is not yet approved or cleared by the [place] FDA and  has been authorized for detection and/or diagnosis of SARS-CoV-2 by  FDA under an Emergency Use Authorization (EUA). This EUA will remain  in effect (meaning this test can be used) for the duration of the  Covid-19 declaration under Section 564(b)(1) of the Act, 21  U.S.C. section 360bbb-3(b)(1), unless the authorization is  terminated or revoked. Performed at [facility], [place] [address] [place], [place] [address]   MRSA PCR Screening     Status: None   Collection Time: 09/25/20  9:29 PM   Specimen: Nasal Mucosa; Nasopharyngeal  Result Value Ref Range Status   MRSA by PCR NEGATIVE NEGATIVE Final    Comment:        The GeneXpert MRSA Assay (FDA approved for NASAL specimens only), is one component of a comprehensive MRSA colonization surveillance program. It is not intended to diagnose MRSA infection nor to guide or monitor treatment for MRSA infections. Performed at [facility], [place] [address] [place], [place] [address]   Gastrointestinal Panel by PCR , Stool     Status: Abnormal   Collection Time: 09/30/20 11:42 AM   Specimen: Stool  Result Value Ref Range Status   Campylobacter species NOT DETECTED NOT DETECTED Final   Plesimonas shigelloides NOT DETECTED NOT  DETECTED Final   Salmonella species NOT DETECTED NOT DETECTED Final   Yersinia enterocolitica NOT DETECTED NOT DETECTED Final   Vibrio species NOT DETECTED NOT DETECTED Final   Vibrio cholerae NOT DETECTED NOT DETECTED Final   Enteroaggregative E coli (EAEC) NOT DETECTED NOT DETECTED Final   Enteropathogenic E coli (EPEC) DETECTED (A) NOT DETECTED Final    Comment: RESULT CALLED TO, READ BACK BY AND VERIFIED WITH: AMINA MOUHAMUD 10/01/20 AT 1220 BY ACR    Enterotoxigenic E coli (ETEC) NOT DETECTED NOT DETECTED Final   Shiga like toxin producing E coli (STEC) NOT DETECTED NOT DETECTED Final   Shigella/Enteroinvasive E coli (EIEC) NOT DETECTED NOT DETECTED Final   Cryptosporidium NOT DETECTED NOT DETECTED Final   Cyclospora cayetanensis NOT DETECTED NOT DETECTED Final   Entamoeba histolytica NOT DETECTED NOT DETECTED Final   Giardia lamblia NOT DETECTED NOT DETECTED Final   Adenovirus F40/41 NOT DETECTED NOT DETECTED Final   Astrovirus NOT DETECTED NOT DETECTED Final   Norovirus GI/GII NOT DETECTED NOT DETECTED Final   Rotavirus A NOT DETECTED NOT DETECTED Final   Sapovirus (I, II, IV, and V) NOT DETECTED NOT DETECTED Final    Comment: Performed at [facility], [address] [place], [place] [address]         Radiology Studies: US RENAL  Result Date: 10/03/2020 CLINICAL DATA:  Acute renal insufficiency EXAM: RENAL / URINARY TRACT ULTRASOUND COMPLETE COMPARISON:  08/28/2019, 01/03/2019 FINDINGS: Right Kidney: Renal measurements: 10.9 x 5.2 x 4.5 cm = volume: 133.3 mL. Echogenicity within normal limits. No mass or hydronephrosis visualized. Left Kidney: Renal measurements: 10.4 x 5.0 x 4.6 cm = volume: 126.3 mL. Echogenicity within normal limits. No mass or hydronephrosis visualized. Bladder: Appears normal for degree of bladder distention. Other: None. IMPRESSION: 1. Unremarkable renal ultrasound. Electronically Signed   By: Randa Ngo M.D.   On: 10/03/2020 19:33         Scheduled Meds: . amiodarone  100 mg Oral Daily  .  apixaban  5 mg Oral BID  . diazepam  2 mg Oral TID  . insulin aspart  0-9 Units Subcutaneous Q4H  . potassium chloride  40 mEq Oral Q4H  . sodium chloride flush  3 mL Intravenous Q12H   Continuous Infusions: . sodium chloride Stopped (10/02/20 1029)  . furosemide 120 mg (10/03/20 1655)    Assessment & Plan:   Principal Problem:   Acute respiratory failure ([place]) Active Problems:   Essential hypertension   Type 2 diabetes mellitus with neuropathy   Morbid obesity (HCC)   PAF (paroxysmal atrial fibrillation) (HCC)   COPD (chronic obstructive pulmonary disease) (HCC)   Acute on chronic combined systolic and diastolic CHF (congestive heart failure) (HCC)   OSA (obstructive sleep apnea)   Acute encephalopathy   Acute respiratory failure with hypoxia and hypercapnia (HCC)   Acute systolic CHF (congestive heart failure) (HCC)   Stage III pressure ulcer of sacral region ([place])   Acute on chronic hypoxic and hypercapneicrespiratory failure: The patient was admitted with respiratory acidosis and hypoxia that required BIPAP. Repeat ABG with the patient on BIPAP demonstrated somewhat improved, but continued respiratory acidosis and hypercarbia.  Per pulmonology the patient need only wear BIPAP at night. He has been intermittently refusing to wear his O2 or BIPAP. He is currently requiring 15 L by [place] to maintain SaO2 in the 90's.   CTA chest was considered to rule out PE, but cannot due to creatinine of 3.69. Due to patient's high O2 requirements  doubt that he could tolerate VQ scan.  12/1-pccm -no evidence of AE COPD. Persistent hypercapnia multifactorial with volume overload and deconditioning. 12/2-continue volume removal with Lasix.  Has had good urine output.   Nephrology discontinued metolazone and continue Lasix home  Need to continue wearing BiPAP and at night sleeping Limit sedation reducing Valium if possible---> will  decrease Valium from 3 times daily to twice daily    Acute on chronic combined systolic and diastolic CHF (congestive heart failure) with severe RV systolic failure and dilatation: The patient is on 3-4 L of O2 at home. He presented with pulmonary edema, pedal edema and BNP elevation of 3275. 12/1-continue IV Lasix and metolazone 12/2-continue IV Lasix twice daily 120 mg Metolazone discontinued by nephrology Cardiology consulted With his RV dysfunction and pulmonary hypertension etiology unclear.  Unable to perform CTA due to AKI-tolerating VQ scan  Severe hypokalemia:  Improved.  Today K is 3.4 we will continue with supplementation  Hypomagnesemia:  Stable.  Mag 2.0  Acute encephalopathy: Resolved. Due to hypercarbia and possibly Neurontin which has been held. 12/2-continue to monitor MS   Diarrhea:  Improving.  Continue to monitor  A/CKD 4: Creatinine/GFR was worsening, now slowly improving. However with much edema on metalozone and lasix, will consult nephrology for further mx/recommendation  Mildly elevated Troponin: Likely due to cardiac strain in setting of severe renal disease  Type 2 diabetes mellitus with neuropathy: HBA1c of 09/25/2020 was 6.7.  Blood glucose levels stable Continue to monitor  Morbid obesity: This complicates all cares. Body mass index is 35.26 kg/m.  PAF (paroxysmal atrial fibrillation): Sinus rhythm per EKG 09/25/2020. Monitor on telemetry. Amiodarone and Apixaban to be continued. Rate is well controlled.   Chronic pain: Narcotics are held as them may depress respiration and worsen respiratory acidosis and hypercarbia. He is receiving IV acetaminophen. I will start the patient on a low dose of hydrocodone due to his severe hip pain. Will avoid restarting his 10/325 mg percocets as he  takes at time out of concern for respiratory depression and worsening acidosis.   Normocytic anemia: Hb has ranged from 8.8-11 in the past year. Hemoglobin  is stable at 10.9.  H/o PEA arrest in 10/20: Noted. Monitor on telemetry.   DVT prophylaxis: Eliquis Code Status: Full Family Communication: Wife at bedside  Status is: Inpatient  Remains inpatient appropriate because:IV treatments appropriate due to intensity of illness or inability to take PO   Dispo: The patient is from: Home              Anticipated d/c is to: SNF              Anticipated d/c date is: 3 days              Patient currently is not medically stable to d/c.            LOS: 9 days   Time spent: 35 min with >50% on coc   Nolberto Hanlon, MD [facility] Pager [phone]  If 7PM-7AM, please contact night-coverage [email or web address] Password [facility] 10/04/2020, 8:18 AM

## 2020-10-04 NOTE — Consult Note (Signed)
[place] Nurse Consult Note: Reason for Consult: Consult requested for sacrum.  Wound type: Upper inner gluteal cleft/sacrum with moisture associated skin damage; Flexiseal in place to attempt to contain stool but stool is constantly leaking around the insertion site and has created skin breakdown which has progressed to a deep tissue pressure injury. Affected area is approx 20% red and moist, 80% dark red-purple. It is difficult to keep the affected area from becoming soiled.  Pressure Injury POA: No Measurement: Approx 4X.8X.2cm, scant amt tan drainage, no odor or fluctuance.  Deep tissue pressure injuries are high risk to evolve into full thickness tissue loss within 7-10 days.  Dressing procedure/placement/frequency: Topical treatment orders provided for bedside nurses to perform as follows: Foam dressing to sacrum/gluteal cleft, change Q 3 days or PRN soiling. [place] team will reassess the location weekly to determine if a change in the plan of care is indicted at that time.  Julien Girt MSN, RN, [place], [place], [place]

## 2020-10-05 ENCOUNTER — Inpatient Hospital Stay (HOSPITAL_COMMUNITY): Payer: No Typology Code available for payment source

## 2020-10-05 DIAGNOSIS — I50810 Right heart failure, unspecified: Secondary | ICD-10-CM | POA: Diagnosis not present

## 2020-10-05 DIAGNOSIS — Z7189 Other specified counseling: Secondary | ICD-10-CM

## 2020-10-05 DIAGNOSIS — N179 Acute kidney failure, unspecified: Secondary | ICD-10-CM

## 2020-10-05 DIAGNOSIS — Z515 Encounter for palliative care: Secondary | ICD-10-CM

## 2020-10-05 LAB — GLUCOSE, CAPILLARY
Glucose-Capillary: 121 mg/dL — ABNORMAL HIGH (ref 70–99)
Glucose-Capillary: 101 mg/dL — ABNORMAL HIGH (ref 70–99)
Glucose-Capillary: 101 mg/dL — ABNORMAL HIGH (ref 70–99)
Glucose-Capillary: 117 mg/dL — ABNORMAL HIGH (ref 70–99)
Glucose-Capillary: 180 mg/dL — ABNORMAL HIGH (ref 70–99)

## 2020-10-05 LAB — CBC
HCT: 33.7 % — ABNORMAL LOW (ref 39.0–52.0)
Hemoglobin: 10.5 g/dL — ABNORMAL LOW (ref 13.0–17.0)
MCH: 26.1 pg (ref 26.0–34.0)
MCHC: 31.2 g/dL (ref 30.0–36.0)
MCV: 83.6 fL (ref 80.0–100.0)
Platelets: 177 10*3/uL (ref 150–400)
RBC: 4.03 MIL/uL — ABNORMAL LOW (ref 4.22–5.81)
RDW: 18.5 % — ABNORMAL HIGH (ref 11.5–15.5)
WBC: 10.3 10*3/uL (ref 4.0–10.5)
nRBC: 0 % (ref 0.0–0.2)

## 2020-10-05 LAB — BASIC METABOLIC PANEL
Anion gap: 18 — ABNORMAL HIGH (ref 5–15)
BUN: 94 mg/dL — ABNORMAL HIGH (ref 8–23)
CO2: 36 mmol/L — ABNORMAL HIGH (ref 22–32)
Calcium: 8.4 mg/dL — ABNORMAL LOW (ref 8.9–10.3)
Chloride: 83 mmol/L — ABNORMAL LOW (ref 98–111)
Creatinine, Ser: 3.02 mg/dL — ABNORMAL HIGH (ref 0.61–1.24)
GFR, Estimated: 21 mL/min — ABNORMAL LOW (ref >60)
Glucose, Bld: 110 mg/dL — ABNORMAL HIGH (ref 70–99)
Potassium: 2.8 mmol/L — ABNORMAL LOW (ref 3.5–5.1)
Sodium: 137 mmol/L (ref 135–145)

## 2020-10-05 LAB — BRAIN NATRIURETIC PEPTIDE: B Natriuretic Peptide: 1180.7 pg/mL — ABNORMAL HIGH (ref 0.0–100.0)

## 2020-10-05 MED ORDER — POTASSIUM CHLORIDE 20 MEQ PO PACK
40.0000 meq | PACK | ORAL | Status: DC
  Filled 2020-10-05 (×2): qty 2

## 2020-10-05 MED ORDER — POTASSIUM CHLORIDE 20 MEQ PO PACK
40.0000 meq | PACK | ORAL | Status: AC
  Administered 2020-10-05 (×3): 40 meq via ORAL
  Filled 2020-10-05 (×3): qty 2

## 2020-10-05 MED ORDER — ATORVASTATIN CALCIUM 40 MG PO TABS
40.0000 mg | ORAL_TABLET | Freq: Every day | ORAL | Status: DC
  Administered 2020-10-05 – 2020-10-09 (×5): 40 mg via ORAL
  Filled 2020-10-05 (×6): qty 1

## 2020-10-05 MED ORDER — TORSEMIDE 20 MG PO TABS
40.0000 mg | ORAL_TABLET | Freq: Two times a day (BID) | ORAL | Status: DC
  Administered 2020-10-05 – 2020-10-06 (×3): 40 mg via ORAL
  Filled 2020-10-05 (×3): qty 2

## 2020-10-05 NOTE — Progress Notes (Addendum)
S:Patient reports that he is doing about the same, he was not answering questions but shook his head yes and no. He feels like his breathing is about the same and is having some abdominal pain. Feels like he is urinating okay.  O:BP 110/63 (BP Location: Left Arm)   Pulse 79   Temp 97.8 F (36.6 C) (Axillary)   Resp (!) 30   Ht 6' (1.829 m)   Wt 103.6 kg   SpO2 96%   BMI 30.98 kg/m   Intake/Output Summary (Last 24 hours) at 10/05/2020 0839 Last data filed at 10/05/2020 7026 Gross per 24 hour  Intake 963 ml  Output 2250 ml  Net -1287 ml   Intake/Output: I/O last 3 completed shifts: In: 3785 [P.O.:1080; I.V.:96] Out: 2900 [Urine:2500; Stool:400]  Intake/Output this shift:  No intake/output data recorded. Weight change: 1.4 kg YIF:OYDXAJO male, NAD, laying in bed CVS: RRR, no m/r/g Resp: Decreased breath sounds, on 7L [place] Abd: Soft, mid line bony protrusion tender to touch Ext: No LE swelling, trace presacral edema  Recent Labs  Lab 09/30/20 0025 10/01/20 1609 10/02/20 0036 10/02/20 1219 10/03/20 0218 10/03/20 1555 10/04/20 0049 10/05/20 0105  NA 144  --  146* 142 145 144 141 137  K 3.6  --  2.2* 2.2* 2.8* 3.4* 3.4* 2.8*  CL 95*  --  87* 84* 84* 84* 86* 83*  CO2 33*  --  40* 39* 41* 40* 33* 36*  GLUCOSE 143*  --  120* 132* 125* 141* 134* 110*  BUN 57*  --  67* 69* 75* 85* 88* 94*  CREATININE 3.69*  --  3.11* 3.06* 2.97* 3.25* 3.10* 3.02*  CALCIUM 8.1*  --  8.2* 8.1* 8.9 9.0 8.7* 8.4*  PHOS  --  3.5  --   --   --   --   --   --    Liver Function Tests: No results for input(s): AST, ALT, ALKPHOS, BILITOT, PROT, ALBUMIN in the last 168 hours. No results for input(s): LIPASE, AMYLASE in the last 168 hours. No results for input(s): AMMONIA in the last 168 hours. CBC: Recent Labs  Lab 09/30/20 0025 10/05/20 0105  WBC 6.6 10.3  NEUTROABS 5.3  --   HGB 10.9* 10.5*  HCT 36.8* 33.7*  MCV 86.2 83.6  PLT 278 177   Cardiac Enzymes: No results for input(s): CKTOTAL,  CKMB, CKMBINDEX, TROPONINI in the last 168 hours. CBG: Recent Labs  Lab 10/04/20 1701 10/04/20 1939 10/04/20 2332 10/05/20 0255 10/05/20 0735  GLUCAP 103* 129* 102* 121* 101*    Iron Studies: No results for input(s): IRON, TIBC, TRANSFERRIN, FERRITIN in the last 72 hours. Studies/Results: DG Abd 1 View  Result Date: 10/04/2020 CLINICAL DATA:  Generalized abdominal pain for 1 day EXAM: ABDOMEN - 1 VIEW COMPARISON:  08/27/2019 FINDINGS: Scattered large and small bowel gas is noted. No obstructive changes are seen. No free air is noted. Left hip replacement is again noted and stable. No acute bony abnormality is seen. Mild degenerative changes of lumbar spine are. IMPRESSION: No acute abnormality in the abdomen. Electronically Signed   By: Inez Catalina M.D.   On: 10/04/2020 19:37   US RENAL  Result Date: 10/03/2020 CLINICAL DATA:  Acute renal insufficiency EXAM: RENAL / URINARY TRACT ULTRASOUND COMPLETE COMPARISON:  08/28/2019, 01/03/2019 FINDINGS: Right Kidney: Renal measurements: 10.9 x 5.2 x 4.5 cm = volume: 133.3 mL. Echogenicity within normal limits. No mass or hydronephrosis visualized. Left Kidney: Renal measurements: 10.4 x 5.0 x 4.6  cm = volume: 126.3 mL. Echogenicity within normal limits. No mass or hydronephrosis visualized. Bladder: Appears normal for degree of bladder distention. Other: None. IMPRESSION: 1. Unremarkable renal ultrasound. Electronically Signed   By: Randa Ngo M.D.   On: 10/03/2020 19:33   . amiodarone  100 mg Oral Daily  . apixaban  5 mg Oral BID  . atorvastatin  40 mg Oral Daily  . diazepam  2 mg Oral BID  . insulin aspart  0-9 Units Subcutaneous Q4H  . potassium chloride  40 mEq Oral Q4H  . sodium chloride flush  3 mL Intravenous Q12H  . torsemide  40 mg Oral Daily    BMET    Component Value Date/Time   NA 137 10/05/2020 0105   K 2.8 (L) 10/05/2020 0105   CL 83 (L) 10/05/2020 0105   CO2 36 (H) 10/05/2020 0105   GLUCOSE 110 (H) 10/05/2020 0105    BUN 94 (H) 10/05/2020 0105   CREATININE 3.02 (H) 10/05/2020 0105   CREATININE 0.76 03/24/2016 1656   CALCIUM 8.4 (L) 10/05/2020 0105   GFRNONAA 21 (L) 10/05/2020 0105   GFRAA 18 (L) 07/25/2020 1553   CBC    Component Value Date/Time   WBC 10.3 10/05/2020 0105   RBC 4.03 (L) 10/05/2020 0105   HGB 10.5 (L) 10/05/2020 0105   HCT 33.7 (L) 10/05/2020 0105   PLT 177 10/05/2020 0105   MCV 83.6 10/05/2020 0105   MCH 26.1 10/05/2020 0105   MCHC 31.2 10/05/2020 0105   RDW 18.5 (H) 10/05/2020 0105   LYMPHSABS 0.6 (L) 09/30/2020 0025   MONOABS 0.6 09/30/2020 0025   EOSABS 0.0 09/30/2020 0025   BASOSABS 0.0 09/30/2020 0025     Assessment/Plan:  1. CKDIV: Cr at baseline around 3.6.Cron admission was 3.54, increased up to 4.06.  Creatinine appears to be improvingsince admission, but rose from 2.97 to 3.1 following a dose of metolazone 10 mg on 10/03/20.Torsemide was added 12/2 by pulmonology. Today's Cr 3.02. Net output 1L yesterday, weight up to 103.6 from 102.2.  1. Has been having adequate output with no issues urinating and has diuresed over 14 liters since admission 2. Would not be too aggressive with diuresis setting a goal net negative of about 2 liters per day to reduce the risk recurrent AKI. 3. Only dose metolazone on an as needed basis  4. Increase torsemide to 40 mg BID 5. Continue to follow UOP and daily Scr. 2. Acute hypoxic and hypercarbic respiratory failure:Appears multifactorial 2/2 OSA/OHS, acute on chronic heart failure, COPD. Has been on and off BiPAP and now only on BiPAP at night. Pt does refuse oxygen during the day and not sure he is compliant with BiPAP 3. Acute on chronic combined systolic and diastolic CHF with RV failure.  He has diuresed well since admission and net negative over 14 liters since admission.  Weight down from 117.9to 102 kg (went up to 122 during admission).  Poor RV function, advanced heart failure on board.    4. Fever: Noted overnight, up to  101. Per primary.  5. Pulmonary HTN and RV failure- unsure if this is due to his chronic lung disease or possible PE.  Unable to perform CTA due to AKI and doubt he could tolerate V/Q scan.   3. Hypokalemia: K 2.8 today, being repleated. Continue to monitor.  4. Normocytic anemia:Last Hgb 10.9, around BL of 8.8-11. No evidence of bleeding on exam. 5. Paroxysmal A YBF:XOVANVBTY in NSR. Per primary 6. DM: per primary  7. H/o PEA arrest: 8. Morbid obesity: 9. Chronic pain: 10. Disposition- overall prognosis is poor given biventricular failure and persistent hypoxic and hypercarbic respiratory failure.  Palliative care is following.  [name], M.D. PGY3 10/05/2020 8:39 AM  I have seen and examined this patient and agree with plan and assessment in the above note with renal recommendations/intervention highlighted.  Pt was switched from IV lasix to torsemide and continues to diurese.  Scr has been stable around 3-3.25 for the past 6 days.  Advanced HF team following.  Could increase torsemide to 40 mg bid or 80 daily pending HF team's preference but would not be too aggressive with diuresis as above.  He is a poor dialysis candidate given his RV failure. [place] John A Leeanne Butters,MD 10/05/2020 1:08 PM

## 2020-10-05 NOTE — TOC Progression Note (Signed)
Transition of Care [facility]) - Progression Note    Patient Details  Name: Nicholas Caldwell MRN: [id number] Date of Birth: Feb 05, 1946  [facility] CM/SW [place], [place] Phone Number: 10/05/2020, 10:04 AM  Clinical Narrative:    CSW has tried multiple times to reach spouse of pt to confirm dc plans. Spouse's cell phone does not have a vm and house phone just rings. CSW has spoken with pt's daughter but she lives in [place]. CSW has requested to have nurse notify CSW if pt's wife comes to visit pt today.         Expected Discharge Plan and Services                                                 Social Determinants of Health (SDOH) Interventions    Readmission Risk Interventions Readmission Risk Prevention Plan 08/15/2019 06/13/2019 04/14/2019  Transportation Screening Complete Complete Complete  Medication Review Press photographer) Not Complete Complete Complete  Med Review Comments requested discharge medication reconciliation by attending - -  PCP or Specialist appointment within 3-5 days of discharge Not Complete Complete Complete  PCP/Specialist Appt Not Complete comments [facility] informed [facility] and will contact pt directly regarding follow up appt - 6/17 with cardiology  [place] or Home Care Consult Complete Complete Complete  SW Recovery Care/Counseling Consult - Complete Complete  Palliative Care Screening - Not Applicable Not [place] - Not Applicable Not Applicable  Some recent data might be hidden

## 2020-10-05 NOTE — Progress Notes (Signed)
PROGRESS NOTE    Nicholas Caldwell  JKK:[id number] DOB: 1946/05/20 DOA: 09/25/2020 PCP: [name], MD    Brief Narrative:  [name] a 74 y.o.malewith medical history ofNISCM, systolic CHF and mod RV failure, DM2, CKD 4, PAF s/p DCCV, COPD on home O2, OHS/ OSA on 3 L O2 and CPAP&CAD.  Per ED notes, he has been more short of breath for about 1 wk and has more fluid on his legs.  Pulse ox was 86% on 4 L. Stayed in 80s on 6 L. Subsequently placed on a non re-breather- pulse ox has been 100%. Given 40 mg IV Lasix. CXR reveals pulmonary edema.  Pt is intermittently non-compliant with BIPAP and O2. He continues to require BIPAP with 70% O2 or NRB with 10 L O2 to maintain SaO2 in the 90's.  12/1-RT checked on Pt, pt off bipap and back on HFNC Off note, pt on 3-4L [place] at home.  12/2-Spoke to wife at bedside that pt appears very quiet, soft spoken, pt's wife stated this is actually his baseline. D/w wife about palliative care, she is agreeable.  12/3 febrile last night, Tmax 101. A bump noted by nsg at epigastric area.  Consultants:   pccm, nephrology  Procedures:   Antimicrobials:       Subjective: Wife at bedside.  Patient reports shortness of breath better.  No nausea, vomiting, chills, or abdominal pain   Objective: Vitals:   10/04/20 2334 10/05/20 0045 10/05/20 0257 10/05/20 0531  BP: 116/76  110/63   Pulse: 86  79   Resp: (!) 30  (!) 30   Temp: (!) 101 F (38.3 C) 98 F (36.7 C) 97.8 F (36.6 C)   TempSrc: Axillary Oral Axillary   SpO2: 96% 99% 96%   Weight:    103.6 kg  Height:        Intake/Output Summary (Last 24 hours) at 10/05/2020 0836 Last data filed at 10/05/2020 7169 Gross per 24 hour  Intake 963 ml  Output 2250 ml  Net -1287 ml   Filed Weights   10/03/20 0500 10/04/20 0433 10/05/20 0531  Weight: 104 kg 102.2 kg 103.6 kg    Examination: Lying bed, wife at bedside Positive JVD Scattered Rales bilaterally, no wheezing, decreased  breath sounds bilateral bases Regular, S1-S2 no gallops Soft, nondistended, a golf size hard lump at the mid epigastric area, not reducible, nontender Mild bilateral lower extremity edema Alert oriented x3, grossly intact Mood and affect appropriate in current setting    Data Reviewed: I have personally reviewed following labs and imaging studies  CBC: Recent Labs  Lab 09/30/20 0025 10/05/20 0105  WBC 6.6 10.3  NEUTROABS 5.3  --   HGB 10.9* 10.5*  HCT 36.8* 33.7*  MCV 86.2 83.6  PLT 278 678   Basic Metabolic Panel: Recent Labs  Lab 09/29/20 0103 09/29/20 1416 10/01/20 1609 10/02/20 0036 10/02/20 1219 10/03/20 0218 10/03/20 1555 10/04/20 0049 10/05/20 0105  NA 143   < >  --    < > 142 145 144 141 137  K 2.9*   < >  --    < > 2.2* 2.8* 3.4* 3.4* 2.8*  CL 95*   < >  --    < > 84* 84* 84* 86* 83*  CO2 32   < >  --    < > 39* 41* 40* 33* 36*  GLUCOSE 130*   < >  --    < > 132* 125* 141* 134* 110*  BUN  61*   < >  --    < > 69* 75* 85* 88* 94*  CREATININE 3.95*   < >  --    < > 3.06* 2.97* 3.25* 3.10* 3.02*  CALCIUM 8.1*   < >  --    < > 8.1* 8.9 9.0 8.7* 8.4*  MG 2.3  --  2.0  --  2.0  --   --   --   --   PHOS  --   --  3.5  --   --   --   --   --   --    < > = values in this interval not displayed.   GFR: Estimated Creatinine Clearance: 27.5 mL/min (A) (by C-G formula based on SCr of 3.02 mg/dL (H)). Liver Function Tests: No results for input(s): AST, ALT, ALKPHOS, BILITOT, PROT, ALBUMIN in the last 168 hours. No results for input(s): LIPASE, AMYLASE in the last 168 hours. No results for input(s): AMMONIA in the last 168 hours. Coagulation Profile: No results for input(s): INR, PROTIME in the last 168 hours. Cardiac Enzymes: No results for input(s): CKTOTAL, CKMB, CKMBINDEX, TROPONINI in the last 168 hours. BNP (last 3 results) No results for input(s): PROBNP in the last 8760 hours. HbA1C: No results for input(s): HGBA1C in the last 72 hours. CBG: Recent Labs   Lab 10/04/20 1701 10/04/20 1939 10/04/20 2332 10/05/20 0255 10/05/20 0735  GLUCAP 103* 129* 102* 121* 101*   Lipid Profile: No results for input(s): CHOL, HDL, LDLCALC, TRIG, CHOLHDL, LDLDIRECT in the last 72 hours. Thyroid Function Tests: No results for input(s): TSH, T4TOTAL, FREET4, T3FREE, THYROIDAB in the last 72 hours. Anemia Panel: No results for input(s): VITAMINB12, FOLATE, FERRITIN, TIBC, IRON, RETICCTPCT in the last 72 hours. Sepsis Labs: No results for input(s): PROCALCITON, LATICACIDVEN in the last 168 hours.  Recent Results (from the past 240 hour(s))  Respiratory Panel by RT PCR (Flu A&B, Covid) - Nasopharyngeal Swab     Status: None   Collection Time: 09/25/20  2:03 PM   Specimen: Nasopharyngeal Swab; Nasopharyngeal(NP) swabs in vial transport medium  Result Value Ref Range Status   SARS Coronavirus 2 by RT PCR NEGATIVE NEGATIVE Final    Comment: (NOTE) SARS-CoV-2 target nucleic acids are NOT DETECTED.  The SARS-CoV-2 RNA is generally detectable in upper respiratoy specimens during the acute phase of infection. The lowest concentration of SARS-CoV-2 viral copies this assay can detect is 131 copies/mL. A negative result does not preclude SARS-Cov-2 infection and should not be used as the sole basis for treatment or other patient management decisions. A negative result may occur with  improper specimen collection/handling, submission of specimen other than nasopharyngeal swab, presence of viral mutation(s) within the areas targeted by this assay, and inadequate number of viral copies (<131 copies/mL). A negative result must be combined with clinical observations, patient history, and epidemiological information. The expected result is Negative.  Fact Sheet for Patients:  PinkCheek.be  Fact Sheet for Healthcare Providers:  GravelBags.it  This test is no t yet approved or cleared by the [place]  FDA and  has been authorized for detection and/or diagnosis of SARS-CoV-2 by FDA under an Emergency Use Authorization (EUA). This EUA will remain  in effect (meaning this test can be used) for the duration of the COVID-19 declaration under Section 564(b)(1) of the Act, 21 U.S.C. section 360bbb-3(b)(1), unless the authorization is terminated or revoked sooner.     Influenza A by PCR NEGATIVE NEGATIVE Final  Influenza B by PCR NEGATIVE NEGATIVE Final    Comment: (NOTE) The Xpert Xpress SARS-CoV-2/FLU/RSV assay is intended as an aid in  the diagnosis of influenza from Nasopharyngeal swab specimens and  should not be used as a sole basis for treatment. Nasal washings and  aspirates are unacceptable for Xpert Xpress SARS-CoV-2/FLU/RSV  testing.  Fact Sheet for Patients: PinkCheek.be  Fact Sheet for Healthcare Providers: GravelBags.it  This test is not yet approved or cleared by the [place] FDA and  has been authorized for detection and/or diagnosis of SARS-CoV-2 by  FDA under an Emergency Use Authorization (EUA). This EUA will remain  in effect (meaning this test can be used) for the duration of the  Covid-19 declaration under Section 564(b)(1) of the Act, 21  U.S.C. section 360bbb-3(b)(1), unless the authorization is  terminated or revoked. Performed at [facility], [place] [address] [place], [place] [address]   MRSA PCR Screening     Status: None   Collection Time: 09/25/20  9:29 PM   Specimen: Nasal Mucosa; Nasopharyngeal  Result Value Ref Range Status   MRSA by PCR NEGATIVE NEGATIVE Final    Comment:        The GeneXpert MRSA Assay (FDA approved for NASAL specimens only), is one component of a comprehensive MRSA colonization surveillance program. It is not intended to diagnose MRSA infection nor to guide or monitor treatment for MRSA infections. Performed at [facility], [place] [address]  [place], [place] [address]   Gastrointestinal Panel by PCR , Stool     Status: Abnormal   Collection Time: 09/30/20 11:42 AM   Specimen: Stool  Result Value Ref Range Status   Campylobacter species NOT DETECTED NOT DETECTED Final   Plesimonas shigelloides NOT DETECTED NOT DETECTED Final   Salmonella species NOT DETECTED NOT DETECTED Final   Yersinia enterocolitica NOT DETECTED NOT DETECTED Final   Vibrio species NOT DETECTED NOT DETECTED Final   Vibrio cholerae NOT DETECTED NOT DETECTED Final   Enteroaggregative E coli (EAEC) NOT DETECTED NOT DETECTED Final   Enteropathogenic E coli (EPEC) DETECTED (A) NOT DETECTED Final    Comment: RESULT CALLED TO, READ BACK BY AND VERIFIED WITH: AMINA MOUHAMUD 10/01/20 AT 1220 BY ACR    Enterotoxigenic E coli (ETEC) NOT DETECTED NOT DETECTED Final   Shiga like toxin producing E coli (STEC) NOT DETECTED NOT DETECTED Final   Shigella/Enteroinvasive E coli (EIEC) NOT DETECTED NOT DETECTED Final   Cryptosporidium NOT DETECTED NOT DETECTED Final   Cyclospora cayetanensis NOT DETECTED NOT DETECTED Final   Entamoeba histolytica NOT DETECTED NOT DETECTED Final   Giardia lamblia NOT DETECTED NOT DETECTED Final   Adenovirus F40/41 NOT DETECTED NOT DETECTED Final   Astrovirus NOT DETECTED NOT DETECTED Final   Norovirus GI/GII NOT DETECTED NOT DETECTED Final   Rotavirus A NOT DETECTED NOT DETECTED Final   Sapovirus (I, II, IV, and V) NOT DETECTED NOT DETECTED Final    Comment: Performed at [facility], [address], [place], [place] [address]         Radiology Studies: DG Abd 1 View  Result Date: 10/04/2020 CLINICAL DATA:  Generalized abdominal pain for 1 day EXAM: ABDOMEN - 1 VIEW COMPARISON:  08/27/2019 FINDINGS: Scattered large and small bowel gas is noted. No obstructive changes are seen. No free air is noted. Left hip replacement is again noted and stable. No acute bony abnormality is seen. Mild degenerative changes of lumbar spine are.  IMPRESSION: No acute  abnormality in the abdomen. Electronically Signed   By: Inez Catalina M.D.   On: 10/04/2020 19:37   US RENAL  Result Date: 10/03/2020 CLINICAL DATA:  Acute renal insufficiency EXAM: RENAL / URINARY TRACT ULTRASOUND COMPLETE COMPARISON:  08/28/2019, 01/03/2019 FINDINGS: Right Kidney: Renal measurements: 10.9 x 5.2 x 4.5 cm = volume: 133.3 mL. Echogenicity within normal limits. No mass or hydronephrosis visualized. Left Kidney: Renal measurements: 10.4 x 5.0 x 4.6 cm = volume: 126.3 mL. Echogenicity within normal limits. No mass or hydronephrosis visualized. Bladder: Appears normal for degree of bladder distention. Other: None. IMPRESSION: 1. Unremarkable renal ultrasound. Electronically Signed   By: Randa Ngo M.D.   On: 10/03/2020 19:33        Scheduled Meds: . amiodarone  100 mg Oral Daily  . apixaban  5 mg Oral BID  . atorvastatin  40 mg Oral Daily  . diazepam  2 mg Oral BID  . insulin aspart  0-9 Units Subcutaneous Q4H  . potassium chloride  40 mEq Oral Q4H  . sodium chloride flush  3 mL Intravenous Q12H  . torsemide  40 mg Oral Daily   Continuous Infusions: . sodium chloride Stopped (10/02/20 1029)    Assessment & Plan:   Principal Problem:   Acute respiratory failure ([place]) Active Problems:   Essential hypertension   Type 2 diabetes mellitus with neuropathy   Morbid obesity (HCC)   PAF (paroxysmal atrial fibrillation) (HCC)   COPD (chronic obstructive pulmonary disease) (HCC)   Acute on chronic combined systolic and diastolic CHF (congestive heart failure) (HCC)   OSA (obstructive sleep apnea)   Acute encephalopathy   Acute respiratory failure with hypoxia and hypercapnia (HCC)   Acute systolic CHF (congestive heart failure) (HCC)   Stage III pressure ulcer of sacral region ([place])   Acute on chronic hypoxic and hypercapneicrespiratory failure: The patient was admitted with respiratory acidosis and hypoxia that required BIPAP. Repeat ABG with the  patient on BIPAP demonstrated somewhat improved, but continued respiratory acidosis and hypercarbia.  Per pulmonology the patient need only wear BIPAP at night. He has been intermittently refusing to wear his O2 or BIPAP. He is currently requiring 15 L by [place] to maintain SaO2 in the 90's.   CTA chest was considered to rule out PE, but cannot due to creatinine of 3.69. Due to patient's high O2 requirements  doubt that he could tolerate VQ scan.  12/1-pccm -no evidence of AE COPD. Persistent hypercapnia multifactorial with volume overload and deconditioning. 12/2-continue volume removal with Lasix.  Has had good urine output.   Nephrology discontinued metolazone and continue Lasix home  Need to continue wearing BiPAP and at night sleeping Limit sedation reducing Valium if possible---> will decrease Valium from 3 times daily to twice daily 12/3-clinically improving, but needs to be wearing Bipap when sleeping.  Continue 02 support Continue diuresis   Acute on chronic combined systolic and diastolic CHF (congestive heart failure) with severe RV systolic failure and dilatation: The patient is on 3-4 L of O2 at home. He presented with pulmonary edema, pedal edema and BNP elevation of 3275. Was on metolazone and lasix.  Lasix iv switched to PO torsemide 74m bid. Metolazone d/cd by nephrology Weight down about 40 pounds Due to dysfunction and pulmonary hypertension-cardiology was consulted, input was appreciated-made benefit from sildenafil Needs to improve compliance with home O2 and BiPAP Palliative care has seen the patient and I have asked him to come by and week to wife again as she was  more open to discussing palliative care options as of yesterday BNP decreasing but still elevated Heart failure team following     Fever-last night , wbc up from baseline.  Will obtain cxr, ua, ucx, and blood cx.    hypokalemia:  K today is 2.8, will supplement with KCl Monitor levels  Abdominal  bump: Appears to have appeared overnight Abdominal x-ray negative General surgery consulted-we will obtain CT of abdomen for further evaluation  Hypomagnesemia:  Stable. Mag 2.0  Acute encephalopathy: Resolved. Due to hypercarbia and possibly Neurontin which has been held. MS improved   Diarrhea:  Improving.  Continue to monitor  A/CKD stage 4: Creatinine/GFR was worsening, now slowly improving and stabilizing Nephrology following Lasix IV switch to torsemide p.o. Not a great candidate for HD due to RV dysfunction   Mildly elevated Troponin: Likely due to cardiac strain in setting of severe renal disease  Type 2 diabetes mellitus with neuropathy: HBA1c of 09/25/2020 was 6.7.  Blood glucose levels stable Continue to monitor  Morbid obesity: This complicates all cares. Body mass index is 35.26 kg/m.  PAF (paroxysmal atrial fibrillation): Sinus rhythm per EKG 09/25/2020. Monitor on telemetry. Amiodarone and Apixaban to be continued. Rate is well controlled.   Chronic pain: Narcotics are held as them may depress respiration and worsen respiratory acidosis and hypercarbia. He is receiving IV acetaminophen. I will start the patient on a low dose of hydrocodone due to his severe hip pain. Will avoid restarting his 10/325 mg percocets as he takes at time out of concern for respiratory depression and worsening acidosis.   Normocytic anemia: Hb has ranged from 8.8-11 in the past year. Hemoglobin is stable at 10.9.  H/o PEA arrest in 10/20: Noted. Monitor on telemetry.   DVT prophylaxis: Eliquis Code Status: Full Family Communication: Wife at bedside  Status is: Inpatient  Remains inpatient appropriate because:IV treatments appropriate due to intensity of illness or inability to take PO   Dispo: The patient is from: Home              Anticipated d/c is to: SNF              Anticipated d/c date is: 3 days              Patient currently is not medically stable to  d/c.            LOS: 10 days   Time spent: 35 min with >50% on coc   Nolberto Hanlon, MD [facility] Pager [phone]  If 7PM-7AM, please contact night-coverage [email or web address] Password [facility] 10/05/2020, 8:36 AM

## 2020-10-05 NOTE — Progress Notes (Signed)
CT scan reviewed. Patient has what appears to be a chronic deformity of the xyphoid process. It is actually visible dating back to his chest imaging from 2020. This is not acute, no surgical intervention recommended.    CCS will sign off.  Obie Dredge, PA-C

## 2020-10-05 NOTE — Progress Notes (Signed)
Daily Progress Note   Patient Name: Nicholas Caldwell       Date: 10/05/2020 DOB: 12/07/1945  Age: 74 y.o. MRN#: [id number] Attending Physician: Nolberto Hanlon, MD Primary Care Physician: [name], MD Admit Date: 09/25/2020  Reason for Consultation/Follow-up: Establishing goals of care  Subjective: Patient awake, alert but minimally engages in conversation. Will nod yes/no to simple questions. C/o of abdominal pain and waiting to go down for CT to evaluate.   GOC:  Wife, [name] at bedside. Introduced self with palliative medicine team and f/u from Wadie Lessen, NP.   Explored their conversation with medical team including Dr Kurtis Bushman regarding his condition. [name] shares that hope that he will stabilize the best he can with medical management and plan for SNF rehab to get 'stronger.' Reviewed course of hospitalization including diagnoses, interventions, plan of care, and difficulty managing multiple chronic conditions/balancing act. Patient really does not engage in conversation. Today, wife is more focused on plan for CT to evaluate the new abdominal lump. Ultimately, she is hopeful to get him home following rehab. PMT contact information and reassured of ongoing support from PMT.   Length of Stay: 10  Current Medications: Scheduled Meds:  . amiodarone  100 mg Oral Daily  . apixaban  5 mg Oral BID  . atorvastatin  40 mg Oral Daily  . diazepam  2 mg Oral BID  . insulin aspart  0-9 Units Subcutaneous Q4H  . potassium chloride  40 mEq Oral Q4H  . sodium chloride flush  3 mL Intravenous Q12H  . torsemide  40 mg Oral BID    Continuous Infusions: . sodium chloride Stopped (10/02/20 1029)    PRN Meds: sodium chloride, acetaminophen, albuterol, HYDROcodone-acetaminophen, hydrocortisone  valerate cream, ondansetron (ZOFRAN) IV, sodium chloride flush  Physical Exam Vitals and nursing note reviewed.  Constitutional:      General: He is awake.  Cardiovascular:     Rate and Rhythm: Rhythm irregularly irregular.  Pulmonary:     Effort: No tachypnea, accessory muscle usage or respiratory distress.  Neurological:     Mental Status: He is alert and oriented to person, place, and time.     Comments: Does not engage in conversation             Vital Signs: BP 110/63 (BP Location: Left Arm)   Pulse  79   Temp 97.8 F (36.6 C) (Axillary)   Resp (!) 30   Ht 6' (1.829 m)   Wt 103.6 kg   SpO2 96%   BMI 30.98 kg/m  SpO2: SpO2: 96 % O2 Device: O2 Device: Bi-PAP O2 Flow Rate: O2 Flow Rate (L/min): 10 L/min  Intake/output summary:   Intake/Output Summary (Last 24 hours) at 10/05/2020 1418 Last data filed at 10/05/2020 4888 Gross per 24 hour  Intake 483 ml  Output 1250 ml  Net -767 ml   LBM: Last BM Date: 10/04/20 Baseline Weight: Weight: 117.9 kg Most recent weight: Weight: 103.6 kg       Palliative Assessment/Data: PPS 40%      Patient Active Problem List   Diagnosis Date Noted  . Stage III pressure ulcer of sacral region ([place]) 10/01/2020  . Acute systolic CHF (congestive heart failure) ([place]) 09/25/2020  . Acute respiratory failure with hypercapnia ([place])   . Encounter for intubation   . Pressure injury of skin 08/21/2019  . Cardiac arrest ([place]) 08/20/2019  . Palliative care by specialist   . DNR (do not resuscitate) discussion   . Fatigue   . Acute CHF (congestive heart failure) ([place]) 07/07/2019  . Acute on chronic heart failure ([place]) 06/09/2019  . Lobar pneumonia ([place]) 04/14/2019  . Hyperkalemia 04/10/2019  . Cerebral embolism with cerebral infarction 03/21/2019  . Gastritis and gastroduodenitis   . NSVT (nonsustained ventricular tachycardia) ([place]) 03/08/2019  . Tobacco abuse counseling 03/08/2019  . Iron deficiency anemia   . Acute on chronic  systolic CHF (congestive heart failure) ([place]) 03/06/2019  . Diabetes mellitus type 2 in obese ([place])   . Primary osteoarthritis of right hip   . Hypomagnesemia   . Steroid-induced hyperglycemia   . Supplemental oxygen dependent   . Leukocytosis   . Hypokalemia   . Acute on chronic anemia   . Diabetic peripheral neuropathy ([place])   . Acute on chronic systolic (congestive) heart failure ([place])   . Acute on chronic respiratory failure ([place]) 01/14/2019  . Hypoxia   . Altered mental status   . Heart failure ([place]) 12/30/2018  . AKI (acute kidney injury) ([place])   . Acute respiratory failure ([place]) 11/22/2018  . Acute on chronic respiratory failure with hypoxia and hypercapnia ([place]) 11/13/2018  . Metabolic encephalopathy 91/69/4503  . Acute respiratory failure with hypoxia and hypercapnia ([place]) 07/26/2018  . Acute exacerbation of CHF (congestive heart failure) ([place]) 07/26/2018  . CHF exacerbation ([place]) 05/01/2018  . CHF (congestive heart failure) ([place]) 04/30/2018  . Chronic respiratory failure with hypoxia ([place]) 12/28/2017  . Acute encephalopathy   . Atrial fibrillation with RVR ([place])   . SVT (supraventricular tachycardia) ([place])   . COPD GOLD 0   . Medically noncompliant   . Panlobular emphysema ([place])   . OSA (obstructive sleep apnea)   . Pulmonary hypertension ([place])   . Disorientation   . Pressure injury of skin 09/07/2016  . Acute on chronic combined systolic and diastolic CHF (congestive heart failure) ([place]) 09/05/2016  . Skin lesion-left heal 09/05/2016  . Chronic systolic CHF (congestive heart failure) ([place]) 07/16/2016  . COPD (chronic obstructive pulmonary disease) ([place]) 07/16/2016  . Uncontrolled type 2 diabetes mellitus with complication ([place])   . Diabetic polyneuropathy associated with diabetes mellitus due to underlying condition ([place])   . Normocytic anemia 06/29/2016  . CAD - Non-obstructive by LHC1/16 01/21/2016  . Prolonged QT interval 10/09/2015  . Nonischemic  cardiomyopathy ([place]) 10/09/2015  . PAF (paroxysmal atrial fibrillation) (  [place])   . Chronic pain 02/19/2015  . Morbid obesity ([place]) 02/13/2015  . Dyspnea   . Elevated troponin I level 11/01/2014  . COPD exacerbation ([place]) 11/01/2014  . Essential hypertension 10/31/2014  . Type 2 diabetes mellitus with neuropathy 10/31/2014  . Hyperlipidemia  10/31/2014  . Cigarette smoker 10/31/2014    Palliative Care Assessment & Plan   Patient Profile: 74 y.o. male admitted on 09/25/2020 with PMH significant for  NISCM, systolic CHF and mod RV failure, DM2, CKD 4, PAF s/p DCCV, COPD on home O2, OHS/ OSA on 3 L O2 and CPAP&CAD.  Today is day 8 of the patient's hospitalization.  He remains tenuous secondary to his multiple comorbidities of acute on chronic hypoxic hypercarbic respiratory failure, COPD, acute systolic CHF, sacral skin breakdown, and overall failure to thrive.  He is high risk for decompensation.  Patient has had continued physical and functional decline over the past year.    Patient and family face treatment option decisions, advanced directive decisions and anticipatory care needs.  Assessment: Acute on chronic respiratory failure Acute on chronic combined heart failure, prominent RV failure Severe pulmonary hypertension  Acute on CKD stage IV COPD OHS/OSA Type 2 DM Chronic pain Hx PEA arrest in 2020  Recommendations/Plan: Continue full code/full scope treatment. Patient does not engage in discussion. Wife remains hopeful for medical optimization inpatient and planning for discharge to SNF rehab, with hopes he will get stronger and return home.  Pending CT ab this afternoon to evaluate abdominal lump/mass. May benefit from outpatient palliative referral. Needs ongoing palliative discussions with high risk for decline and recurrent hospitalizations.   Code Status: FULL   Code Status Orders  (From admission, onward)         Start     Ordered   09/25/20 1728  Full  code  Continuous        09/25/20 1729        Code Status History    Date Active Date Inactive Code Status Order ID Comments User Context   08/20/2019 2328 08/29/2019 1919 Full Code [id number]  Tyna Jaksch, MD ED   08/09/2019 1753 08/15/2019 1448 Full Code [id number]  Swayze, Ava, DO ED   07/07/2019 2201 07/22/2019 1634 Full Code [id number]  Jani Gravel, MD ED   06/08/2019 0951 06/13/2019 1735 Full Code [id number]  Karmen Bongo, MD ED   04/09/2019 2329 04/14/2019 2125 Full Code [id number]  Shela Leff, MD ED   03/06/2019 2127 03/31/2019 1927 Full Code [id number]  Etta Quill, DO ED   01/14/2019 1810 01/24/2019 1718 Full Code [id number]  Cathlyn Parsons, PA-C Inpatient   01/14/2019 1810 01/14/2019 1810 Full Code [id number]  Cathlyn Parsons, PA-C Inpatient   12/30/2018 1834 01/14/2019 1803 Full Code [id number]  Elmarie Shiley, MD Inpatient   12/01/2018 0332 12/12/2018 1627 Full Code [id number]  Corey Harold, NP ED   11/22/2018 0158 11/26/2018 1456 Full Code [id number]  Phillips Grout, MD ED   11/13/2018 0038 11/16/2018 1616 Full Code [id number]  Renee Pain, MD ED   07/26/2018 0227 08/02/2018 1647 Full Code [id number]  Reyne Dumas, MD ED   07/26/2018 0023 07/26/2018 0227 Full Code [id number]  Rise Patience, MD ED   04/30/2018 2341 05/06/2018 1810 Full Code [id number]  Guadalupe Dawn, MD ED   12/13/2016 0413 12/19/2016 1939 Full Code [id number]  Dannielle Burn, MD ED   10/30/2016 0700 11/05/2016 1532 Full Code [id number]  Marshell Garfinkel, MD ED   09/05/2016  2122 09/16/2016 1834 Full Code [id number]  Ivor Costa, MD ED   07/28/2016 0216 08/04/2016 1733 Full Code [id number]  Toy Baker, MD Inpatient   06/30/2016 0249 07/04/2016 1546 Full Code [id number]  Edwin Dada, MD Inpatient   05/25/2016 2345 05/26/2016 1740 Full Code [id number]  Edwin Dada, MD Inpatient   02/19/2015 1306 02/23/2015 2020 Full Code [id number]  Wilber Oliphant, MD ED   11/06/2014 1638 11/07/2014  1535 Full Code [id number]  Jettie Booze, MD Inpatient   10/31/2014 2314 11/06/2014 1638 Full Code [id number]  Allyne Gee, MD ED   Advance Care Planning Activity      Prognosis: Poor long-term  Discharge Planning: [place] for rehab with Palliative care service follow-up  Care plan was discussed with RN, Dr Kurtis Bushman, patient, wife  Thank you for allowing the Palliative Medicine Team to assist in the care of this patient.   Total Time 30 Prolonged Time Billed  no      Greater than 50%  of this time was spent counseling and coordinating care related to the above assessment and plan.  Ihor Dow, DNP, FNP-C Palliative Medicine Team  Phone: [phone] Fax: [phone]  Please contact Palliative Medicine Team phone at [phone] for questions and concerns.

## 2020-10-05 NOTE — Progress Notes (Addendum)
Advanced Heart Failure Rounding Note  PCP-Cardiologist: No primary care provider on file.   Subjective:     Yesterday IV lasix stopped.Switching to torsemide per nephrology. Weight down > 30 pounds.   Ongoing diarrhea.   Refusing to eat. Refusing Bipap.    Objective:   Weight Range: 103.6 kg Body mass index is 30.98 kg/m.   Vital Signs:   Temp:  [97.8 F (36.6 C)-101 F (38.3 C)] 97.8 F (36.6 C) (12/03 0257) Pulse Rate:  [79-88] 79 (12/03 0257) Resp:  [23-30] 30 (12/03 0257) BP: (107-116)/(57-76) 110/63 (12/03 0257) SpO2:  [87 %-99 %] 96 % (12/03 0257) Weight:  [103.6 kg] 103.6 kg (12/03 0531) Last BM Date: 10/04/20  Weight change: Filed Weights   10/03/20 0500 10/04/20 0433 10/05/20 0531  Weight: 104 kg 102.2 kg 103.6 kg    Intake/Output:   Intake/Output Summary (Last 24 hours) at 10/05/2020 0809 Last data filed at 10/05/2020 5189 Gross per 24 hour  Intake 963 ml  Output 2250 ml  Net -1287 ml      Physical Exam    General:  Appears weak. . No resp difficulty HEENT: Normal Neck: Supple. JVP 5-6. Carotids 2+ bilat; no bruits. No lymphadenopathy or thyromegaly appreciated. Cor: PMI nondisplaced. Regular rate & rhythm. No rubs, gallops or murmurs. Lungs: Clear on  oxygen  Abdomen: Soft, tender, nondistended. No hepatosplenomegaly. No bruits or masses. Good bowel sounds. Extremities: No cyanosis, clubbing, rash, edema Neuro: Alert & orientedx3, cranial nerves grossly intact. moves all 4 extremities w/o difficulty. Affect flat.   Telemetry   SR 70-80s   EKG    N/a   Labs    CBC Recent Labs    10/05/20 0105  WBC 10.3  HGB 10.5*  HCT 33.7*  MCV 83.6  PLT 842   Basic Metabolic Panel Recent Labs    10/02/20 1219 10/03/20 0218 10/04/20 0049 10/05/20 0105  NA 142   < > 141 137  K 2.2*   < > 3.4* 2.8*  CL 84*   < > 86* 83*  CO2 39*   < > 33* 36*  GLUCOSE 132*   < > 134* 110*  BUN 69*   < > 88* 94*  CREATININE 3.06*   < > 3.10*  3.02*  CALCIUM 8.1*   < > 8.7* 8.4*  MG 2.0  --   --   --    < > = values in this interval not displayed.   Liver Function Tests No results for input(s): AST, ALT, ALKPHOS, BILITOT, PROT, ALBUMIN in the last 72 hours. No results for input(s): LIPASE, AMYLASE in the last 72 hours. Cardiac Enzymes No results for input(s): CKTOTAL, CKMB, CKMBINDEX, TROPONINI in the last 72 hours.  BNP: BNP (last 3 results) Recent Labs    09/25/20 1401 10/01/20 1609 10/05/20 0105  BNP 3,275.6* 3,013.5* 1,180.7*    ProBNP (last 3 results) No results for input(s): PROBNP in the last 8760 hours.   D-Dimer No results for input(s): DDIMER in the last 72 hours. Hemoglobin A1C No results for input(s): HGBA1C in the last 72 hours. Fasting Lipid Panel No results for input(s): CHOL, HDL, LDLCALC, TRIG, CHOLHDL, LDLDIRECT in the last 72 hours. Thyroid Function Tests No results for input(s): TSH, T4TOTAL, T3FREE, THYROIDAB in the last 72 hours.  Invalid input(s): FREET3  Other results:   Imaging    DG Abd 1 View  Result Date: 10/04/2020 CLINICAL DATA:  Generalized abdominal pain for 1 day EXAM: ABDOMEN -  1 VIEW COMPARISON:  08/27/2019 FINDINGS: Scattered large and small bowel gas is noted. No obstructive changes are seen. No free air is noted. Left hip replacement is again noted and stable. No acute bony abnormality is seen. Mild degenerative changes of lumbar spine are. IMPRESSION: No acute abnormality in the abdomen. Electronically Signed   By: Inez Catalina M.D.   On: 10/04/2020 19:37      Medications:     Scheduled Medications: . amiodarone  100 mg Oral Daily  . apixaban  5 mg Oral BID  . diazepam  2 mg Oral BID  . insulin aspart  0-9 Units Subcutaneous Q4H  . sodium chloride flush  3 mL Intravenous Q12H  . torsemide  40 mg Oral Daily     Infusions: . sodium chloride Stopped (10/02/20 1029)     PRN Medications:  sodium chloride, acetaminophen, albuterol,  HYDROcodone-acetaminophen, hydrocortisone valerate cream, ondansetron (ZOFRAN) IV, sodium chloride flush     Assessment/Plan   1. Acute on Chronic systolic => diastolic CHF with prominent RV failure/ Severe Pulmonary HTN - likely related to COPD/OSA/OHS. H/o poor compliance w/ home O2 and BiPAP - TEE in 3/20 with EF 40-45%, severely dilated RV with moderately decreased systolic function. However, repeat echo in 9/20 showed EF >65% with moderate RV dilation/mildly decreased RV systolic function, and echo in 10/20 showed EF 60-65% with severe RV dilation/severe RV dysfunction.  - Now admitted w/ acute on chronic hypoxic respiratory failure and marked fluid overload 2/2 a/c CHF. Admit BNP 3,275. CXR w/ pulmonary edema. Good diuresis thus far w/ IV Lasix. Net I/Os - 15L. Wt down ~40 lb.  - Echo this admit  EF 50-55%, RV severely reduced w/ severely elevated RV systolic pressure, 68 mmHg. PA severely dilated.  -Volume status stable. Diuretics per  nephrology . Continue torsemide 40 mg daily (was on 80 mg bid PTA) - no dig w/ CKD - 2. Acute Hypoxic Hypercarbic Respiratory failure - 2/2 a/c CHF + COPD/OSA/OHS w/ poor compliance w/ home O2 and BiPAP - required NRB on admit - improved w/ diuresis and oxygenation  - CO2 improved, down from 41>>33 - continue daytime O2 and BiPAP during sleep, refusing  Bipap  3. AKI on Stage4 CKD  - followed by nephrology - baseline SCr ~3.6, rose to 4.0 this admit - improved, down to 3.0 today  4.Atrial fibrillation: Paroxysmal. He has history of CVA. He had DCCV in 3/20 and again in 5/20. He failed amiodarone in the past and cannot take Tikosyn due to baseline prolonged QT interval. However, he was restarted on amiodarone at 10/20 admission - Had Afib w/ RVR this admit, converted to NSR.   - Continue apixaban 5 mg bid.  - Poor ablation candidate with LA size - Continue amiodarone 100 mg daily.   5. COPD:No longer smokes. Continue homeoxygen. CT  chest showed emphysema. - continue daytime O2/Bipap  - PCCM following   6.OHS/OSA: Continue 3 L oxygen during the day andBIPAP at night.   7.CAD: Nonobstructive disease in 2/20. No chest pain.  - no ASA due to Eliquis  - Restart statin.   8. Hypokalemia Supp K aggressively    PT recommending SNF.    needs Palliative Care for goals of care.   Length of Stay: [place], NP  10/05/2020, 8:09 AM  Advanced Heart Failure Team Pager [phone] (M-F; 7a - 4p)  Please contact [facility] for night-coverage after hours (4p -7a ) and weekends on [email or web address]  Patient seen  with NP, agree with the above note.   Patient has predominantly RV failure in the setting of COPD and OHS/OSA.  He has admitted with marked volume overload but has diuresed well in the hospital, down about 30 lbs. Nephrology has been managing diuretics and he is back on po meds.  His creatinine is stable at 3.0 today.  He remains in NSR on amiodarone.  Noted to have hard mass (apparently new) in RUQ on exam, going for CT today to evaluate.  - Agree with torsemide 40 mg bid for now.  - Awaiting SNF.  - Continue apixaban and amiodarone.  - CT abdomen to evaluate mass.   We will see again on Monday unless called.   Loralie Champagne 10/05/2020

## 2020-10-05 NOTE — Consult Note (Addendum)
[facility] Surgery Consult Note  Amalio Loe 06-Dec-1945  [id number].    Requesting MD: Dr. Nolberto Hanlon Chief Complaint/Reason for Consult: new lump/mass unknown etiology  HPI:  Nicholas Caldwell is a 74 y/o male with multiple medical problems who was admitted to hospital 09/25/20 for acute on chronic respiratory failure, CHF exacerbation, RV failure, and AKI/CKD. At baseline he wears 3-4 L O2 at home. 24-48 hours ago his medical team and nurses noticed a new, hard lump on his upper abdomen vs lower chest wall. He spiked a fever overnight. General surgery was asked to evaluate.   On exam the patients wife is at the bedside assisting with history. They state the mass was not there on Wednesday. It is non-tender and has never been there before. He has a history of laparotomy and splenectomy. He is on Eliquis for a.fib. Per wife the patient did need CPR 1-2 months ago.   ROS: Review of Systems  Constitutional: Negative.   HENT: Negative.   Respiratory: Negative.   Cardiovascular: Positive for leg swelling. Negative for chest pain and palpitations.  Gastrointestinal: Negative.   Genitourinary: Negative.   Musculoskeletal: Negative.   Skin: Negative.   Neurological: Negative.   Endo/Heme/Allergies: Bruises/bleeds easily.  Psychiatric/Behavioral: Negative.     Family History  Problem Relation Age of Onset  . Heart disease Mother   . Hypertension Mother   . Heart failure Mother   . Heart disease Father   . Hypertension Sister   . Heart attack Neg Hx   . Stroke Neg Hx   . Colon cancer Neg Hx   . Esophageal cancer Neg Hx     Past Medical History:  Diagnosis Date  . Atrial flutter ([place])    a. recurrent AFlutter with RVR;  b. Amiodarone Rx started 4/16  . CAD (coronary artery disease)    a. LHC 1/16:  mLAD diffuse disease, pLCx mild disease, dLCx with disease but too small for PCI, RCA ok, EF 25-30%  . Chronic pain   . Chronic systolic CHF (congestive heart failure) ([place])    . COPD (chronic obstructive pulmonary disease) ([place])   . Diabetes mellitus without complication ([place])   . Ex-smoker 11/2017  . Hypercholesteremia   . Hypertension   . NICM (nonischemic cardiomyopathy) ([place])    a.dx 2016. b. 2D echo 06/2016 - Last echo 07/01/16: mod dilated LV, mod LVH, EF 25-30%, mild-mod MR, sev LAE, mild-mod reduced RV systolic function, mild-mod TR, PASP 2mHG.  .Marland KitchenPAF (paroxysmal atrial fibrillation) (HCC)    On amio - ot a candidate for flecainide due to cardiomyopathy, not a candidate for Tikosyn due to prolonged QT, and felt to be a poor candidate for ablation given left atrial size.  . Pulmonary hypertension ([place]   . Tobacco abuse     Past Surgical History:  Procedure Laterality Date  . BIOPSY  03/11/2019   Procedure: BIOPSY;  Surgeon: CLavena Bullion DO;  [facility] [facility]  Service: Gastroenterology;;  . BIOPSY  03/15/2019   Procedure: BIOPSY;  Surgeon: MIrving Copas, MD;  [facility]  Service: Gastroenterology;;  . CARDIOVERSION N/A 07/30/2018   Procedure: CARDIOVERSION;  Surgeon: MLarey Dresser MD;  [facility];  Service: Cardiovascular;  Laterality: N/A;  . COLONOSCOPY N/A 03/11/2019   Procedure: COLONOSCOPY;  Surgeon: CLavena Bullion DO;  [facility]  Service: Gastroenterology;  Laterality: N/A;  . ENTEROSCOPY N/A 03/15/2019   Procedure: ENTEROSCOPY;  Surgeon: MRush LandmarkGTelford Nab, MD;  [facility];  Service: Gastroenterology;  Laterality: N/A;  . ESOPHAGOGASTRODUODENOSCOPY Left 03/11/2019   Procedure: ESOPHAGOGASTRODUODENOSCOPY (EGD);  Surgeon: Lavena Bullion, DO;  Location: [facility] ENDOSCOPY;  Service: Gastroenterology;  Laterality: Left;  . LEFT AND RIGHT HEART CATHETERIZATION WITH CORONARY ANGIOGRAM N/A 11/06/2014   Procedure: LEFT AND RIGHT HEART CATHETERIZATION WITH CORONARY ANGIOGRAM;  Surgeon: Jettie Booze, MD;  [facility];  Service: Cardiovascular;  Laterality: N/A;   . RIGHT/LEFT HEART CATH AND CORONARY ANGIOGRAPHY N/A 12/06/2018   Procedure: RIGHT/LEFT HEART CATH AND CORONARY ANGIOGRAPHY;  Surgeon: Larey Dresser, MD;  Location: [facility] CV LAB;  Service: Cardiovascular;  Laterality: N/A;  . SPLENECTOMY    . SUBMUCOSAL TATTOO INJECTION  03/15/2019   Procedure: SUBMUCOSAL TATTOO INJECTION;  Surgeon: Rush Landmark Telford Nab., MD;  [place];  Service: Gastroenterology;;    Social History:  reports that he quit smoking about 22 months ago. His smoking use included cigarettes. He has a 34.00 pack-year smoking history. He has never used smokeless tobacco. He reports that he does not drink alcohol and does not use drugs.  Allergies:  Allergies  Allergen Reactions  . Bidil [Isosorb Dinitrate-Hydralazine] Shortness Of Breath  . Benazepril Nausea And Vomiting  . Brimonidine     Other reaction(s): Other (See Comments)  . Metformin     Other reaction(s): Cramps (ALLERGY/intolerance), Other (See Comments)  . Morphine Rash    Medications Prior to Admission  Medication Sig Dispense Refill  . albuterol (PROVENTIL) (2.5 MG/3ML) 0.083% nebulizer solution Take 3 mLs (2.5 mg total) by nebulization every 4 (four) hours as needed for wheezing or shortness of breath. 75 mL 12  . albuterol (VENTOLIN HFA) 108 (90 Base) MCG/ACT inhaler Inhale 2 puffs into the lungs every 6 (six) hours as needed for wheezing or shortness of breath.    Marland Kitchen amiodarone (PACERONE) 200 MG tablet Take 0.5 tablets (100 mg total) by mouth daily. 15 tablet 3  . ascorbic acid (VITAMIN C) 500 MG tablet Take 500 mg by mouth 3 (three) times a week. Monday Wednesday Friday    . Budeson-Glycopyrrol-Formoterol (BREZTRI AEROSPHERE) 160-9-4.8 MCG/ACT AERO Inhale 2 puffs into the lungs 2 (two) times daily. 10.7 g 11  . carbamide peroxide (DEBROX) 6.5 % OTIC solution Place 5-10 drops into both ears 3 (three) times daily.    . Carboxymethylcellulose Sod PF 0.25 % SOLN Place 1 drop into both eyes 4  (four) times daily.    . cetirizine (ZYRTEC) 10 MG tablet Take 10 mg by mouth daily as needed for allergies.     . Cholecalciferol 50 MCG (2000 UT) TABS Take 2,000 Units by mouth daily.    . diazepam (VALIUM) 10 MG tablet Take 10 mg by mouth 3 (three) times daily.    . Ensure Max Protein (ENSURE MAX PROTEIN) LIQD Take 330 mLs (11 oz total) by mouth daily.    . famotidine (PEPCID) 20 MG tablet Take 1 tablet (20 mg total) by mouth 2 (two) times daily. 60 tablet 2  . FEROSUL 325 (65 Fe) MG tablet Take 325 mg by mouth 3 (three) times a week. Monday Wednesday Friday    . fluticasone (FLONASE) 50 MCG/ACT nasal spray Place 2 sprays into both nostrils daily as needed for allergies.     Marland Kitchen gabapentin (NEURONTIN) 300 MG capsule Take 1 capsule (300 mg total) by mouth 2 (two) times daily. (Patient taking differently: Take 300 mg by mouth daily. ) 60 capsule 0  . hydrocortisone (ANUSOL-HC) 2.5 % rectal cream  Place 1 application rectally 2 (two) times daily as needed for hemorrhoids or itching.     . hydrocortisone 1 % lotion Apply 1 application topically See admin instructions. Apply small amount to back twice daily for itchy rash as needed    . insulin aspart (NOVOLOG) 100 UNIT/ML injection Inject 4 Units into the skin 3 (three) times daily with meals. 10 mL 0  . insulin aspart protamine - aspart (NOVOLOG MIX 70/30 FLEXPEN) (70-30) 100 UNIT/ML FlexPen Inject 0.1 mLs (10 Units total) into the skin 2 (two) times daily. 15 mL 0  . latanoprost (XALATAN) 0.005 % ophthalmic solution Place 1 drop into both eyes at bedtime as needed (dry eyes).     . metolazone (ZAROXOLYN) 2.5 MG tablet Take 1 tablet (2.5 mg total) by mouth once a week. Every Wednesday (Patient taking differently: Take 2.5 mg by mouth once a week. Take on Wednesdays) 5 tablet 3  . oxybutynin (DITROPAN) 5 MG tablet Take 1 tablet (5 mg total) by mouth 4 (four) times daily. (Patient taking differently: Take 5 mg by mouth 4 (four) times daily as needed for  bladder spasms. ) 120 tablet 0  . oxyCODONE-acetaminophen (PERCOCET) 10-325 MG tablet Take 1 tablet by mouth every 6 (six) hours as needed for pain.     . pantoprazole (PROTONIX) 40 MG tablet Take 40 mg by mouth 2 (two) times daily as needed (heartburn).     . polyethylene glycol (MIRALAX / GLYCOLAX) 17 g packet Take 17 g by mouth daily as needed. (Patient taking differently: Take 17 g by mouth daily as needed for mild constipation. ) 14 each 0  . potassium chloride SA (KLOR-CON) 20 MEQ tablet Take 4 tablets (80 mEq total) by mouth daily. With additional 76mq when you take Metolazone. (Patient taking differently: Take 20-60 mEq by mouth See admin instructions. 630m twice daily and an additional 2065mwhen taking metolazone on Wednesdays) 210 tablet 3  . rosuvastatin (CRESTOR) 40 MG tablet Take 20 mg by mouth daily.    . sMarland Kitchennna-docusate (SENOKOT-S) 8.6-50 MG tablet Take 1 tablet by mouth at bedtime as needed for mild constipation. 30 tablet 0  . sodium chloride (OCEAN) 0.65 % SOLN nasal spray Place 2 sprays into both nostrils as needed for congestion.     . tamsulosin (FLOMAX) 0.4 MG CAPS capsule Take 0.4 mg by mouth daily as needed (urination).     . terbinafine (LAMISIL) 1 % cream Apply 1 application topically daily. To groin for 4 weeks daily with zinc oxide over it    . torsemide (DEMADEX) 20 MG tablet Take 4 tablets (80 mg total) by mouth 2 (two) times daily. 150 tablet 3  . zinc oxide 20 % ointment Apply 1 application topically 2 (two) times daily as needed for irritation (rash).     . aMarland Kitchenixaban (ELIQUIS) 5 MG TABS tablet Take 1 tablet (5 mg total) by mouth 2 (two) times daily. (Patient taking differently: Take 2.5 mg by mouth 2 (two) times daily. ) 60 tablet 1  . Insulin Syringe 27G X 1/2" 0.5 ML MISC 100 Syringes by Does not apply route 3 (three) times daily. 100 each 1  . OXYGEN Inhale 3 L into the lungs continuous.     . predniSONE (DELTASONE) 10 MG tablet Take 4 each am x 2 days,   2 each am  x 2 days,  1 each am x 2 days and stop (Patient not taking: Reported on 09/26/2020) 12 tablet 0  . [EXPIRED] predniSONE (DELTASONE)  10 MG tablet Take 4 tablets (40 mg total) by mouth daily with breakfast for 2 days, THEN 2 tablets (20 mg total) daily with breakfast for 2 days, THEN 1 tablet (10 mg total) daily with breakfast for 2 days. (Patient not taking: Reported on 09/26/2020) 14 tablet 0  . PRESCRIPTION MEDICATION Cpap      Blood pressure 110/63, pulse 79, temperature 97.8 F (36.6 C), temperature source Axillary, resp. rate (!) 30, height 6' (1.829 m), weight 103.6 kg, SpO2 96 %. Physical Exam: Constitutional:obese male, alert, NAD, eating lunch Eyes: Moist conjunctiva; no lid lag; anicteric; PERRL Neck: Trachea midline; no thyromegaly Lungs: Normal respiratory effort on nasal cannula; no tactile fremitus CV: RRR; no palpable thrills; No LE edema GI: Abd soft, obese non-render, previous laparotomy scar appears well healing, no hernias,  2-3 fingerbreadths below the xyphoid process and slightly to the right in the epigastric area there is a large 2 cm mass under the skin. It is firm and feels like bone. It is non-tender. When you track the mass cephalad it feels attached to the anterior chest wall. There are no overlying skin changes. No cellulitis or warmth. MSK: symmetrical; no clubbing/cyanosis Psychiatric: Appropriate affect; alert and oriented x3 Lymphatic: No palpable cervical or axillary lymphadenopathy Neuro: alert, appropriate, following commands, moving all extremities    Results for orders placed or performed during the hospital encounter of 09/25/20 (from the past 48 hour(s))  Glucose, capillary     Status: Abnormal   Collection Time: 10/03/20 11:53 AM  Result Value Ref Range   Glucose-Capillary 127 (H) 70 - 99 mg/dL    Comment: Glucose reference range applies only to samples taken after fasting for at least 8 hours.  Basic metabolic panel     Status: Abnormal    Collection Time: 10/03/20  3:55 PM  Result Value Ref Range   Sodium 144 135 - 145 mmol/L   Potassium 3.4 (L) 3.5 - 5.1 mmol/L   Chloride 84 (L) 98 - 111 mmol/L   CO2 40 (H) 22 - 32 mmol/L   Glucose, Bld 141 (H) 70 - 99 mg/dL    Comment: Glucose reference range applies only to samples taken after fasting for at least 8 hours.   BUN 85 (H) 8 - 23 mg/dL   Creatinine, Ser 3.25 (H) 0.61 - 1.24 mg/dL   Calcium 9.0 8.9 - 10.3 mg/dL   GFR, Estimated 19 (L) >60 mL/min    Comment: (NOTE) Calculated using the CKD-EPI Creatinine Equation (2021)    Anion gap 20 (H) 5 - 15    Comment: Performed at [facility] [address], [place], [place] [address]  Glucose, capillary     Status: Abnormal   Collection Time: 10/03/20  4:11 PM  Result Value Ref Range   Glucose-Capillary 150 (H) 70 - 99 mg/dL    Comment: Glucose reference range applies only to samples taken after fasting for at least 8 hours.  Glucose, capillary     Status: Abnormal   Collection Time: 10/03/20  8:00 PM  Result Value Ref Range   Glucose-Capillary 137 (H) 70 - 99 mg/dL    Comment: Glucose reference range applies only to samples taken after fasting for at least 8 hours.   Comment 1 Notify RN    Comment 2 Document in Chart   Glucose, capillary     Status: Abnormal   Collection Time: 10/03/20 11:54 PM  Result Value Ref Range   Glucose-Capillary 126 (H) 70 - 99  mg/dL    Comment: Glucose reference range applies only to samples taken after fasting for at least 8 hours.   Comment 1 Notify RN    Comment 2 Document in Chart   Basic metabolic panel     Status: Abnormal   Collection Time: 10/04/20 12:49 AM  Result Value Ref Range   Sodium 141 135 - 145 mmol/L   Potassium 3.4 (L) 3.5 - 5.1 mmol/L   Chloride 86 (L) 98 - 111 mmol/L   CO2 33 (H) 22 - 32 mmol/L   Glucose, Bld 134 (H) 70 - 99 mg/dL    Comment: Glucose reference range applies only to samples taken after fasting for at least 8 hours.   BUN 88 (H) 8 - 23 mg/dL    Creatinine, Ser 3.10 (H) 0.61 - 1.24 mg/dL   Calcium 8.7 (L) 8.9 - 10.3 mg/dL   GFR, Estimated 21 (L) >60 mL/min    Comment: (NOTE) Calculated using the CKD-EPI Creatinine Equation (2021)    Anion gap 22 (H) 5 - 15    Comment: Performed at [facility] [address], [place], [place] [address]  Glucose, capillary     Status: Abnormal   Collection Time: 10/04/20  3:29 AM  Result Value Ref Range   Glucose-Capillary 141 (H) 70 - 99 mg/dL    Comment: Glucose reference range applies only to samples taken after fasting for at least 8 hours.   Comment 1 Notify RN    Comment 2 Document in Chart   Glucose, capillary     Status: Abnormal   Collection Time: 10/04/20  7:48 AM  Result Value Ref Range   Glucose-Capillary 113 (H) 70 - 99 mg/dL    Comment: Glucose reference range applies only to samples taken after fasting for at least 8 hours.  Glucose, capillary     Status: Abnormal   Collection Time: 10/04/20 12:10 PM  Result Value Ref Range   Glucose-Capillary 109 (H) 70 - 99 mg/dL    Comment: Glucose reference range applies only to samples taken after fasting for at least 8 hours.  Glucose, capillary     Status: Abnormal   Collection Time: 10/04/20  5:01 PM  Result Value Ref Range   Glucose-Capillary 103 (H) 70 - 99 mg/dL    Comment: Glucose reference range applies only to samples taken after fasting for at least 8 hours.  Glucose, capillary     Status: Abnormal   Collection Time: 10/04/20  7:39 PM  Result Value Ref Range   Glucose-Capillary 129 (H) 70 - 99 mg/dL    Comment: Glucose reference range applies only to samples taken after fasting for at least 8 hours.   Comment 1 Notify RN   Glucose, capillary     Status: Abnormal   Collection Time: 10/04/20 11:32 PM  Result Value Ref Range   Glucose-Capillary 102 (H) 70 - 99 mg/dL    Comment: Glucose reference range applies only to samples taken after fasting for at least 8 hours.   Comment 1 Notify RN   CBC     Status: Abnormal    Collection Time: 10/05/20  1:05 AM  Result Value Ref Range   WBC 10.3 4.0 - 10.5 K/uL   RBC 4.03 (L) 4.22 - 5.81 MIL/uL   Hemoglobin 10.5 (L) 13.0 - 17.0 g/dL   HCT 33.7 (L) 39 - 52 %   MCV 83.6 80.0 - 100.0 fL   MCH 26.1 26.0 - 34.0 pg   MCHC  31.2 30.0 - 36.0 g/dL   RDW 18.5 (H) 11.5 - 15.5 %   Platelets 177 150 - 400 K/uL   nRBC 0.0 0.0 - 0.2 %    Comment: Performed at [facility], [place] [address] [place], [place] [address]  Brain natriuretic peptide     Status: Abnormal   Collection Time: 10/05/20  1:05 AM  Result Value Ref Range   B Natriuretic Peptide 1,180.7 (H) 0.0 - 100.0 pg/mL    Comment: Performed at [facility] [address] [place], [place] [address]  Basic metabolic panel     Status: Abnormal   Collection Time: 10/05/20  1:05 AM  Result Value Ref Range   Sodium 137 135 - 145 mmol/L   Potassium 2.8 (L) 3.5 - 5.1 mmol/L   Chloride 83 (L) 98 - 111 mmol/L   CO2 36 (H) 22 - 32 mmol/L   Glucose, Bld 110 (H) 70 - 99 mg/dL    Comment: Glucose reference range applies only to samples taken after fasting for at least 8 hours.   BUN 94 (H) 8 - 23 mg/dL   Creatinine, Ser 3.02 (H) 0.61 - 1.24 mg/dL   Calcium 8.4 (L) 8.9 - 10.3 mg/dL   GFR, Estimated 21 (L) >60 mL/min    Comment: (NOTE) Calculated using the CKD-EPI Creatinine Equation (2021)    Anion gap 18 (H) 5 - 15    Comment: Performed at [facility] [address] [place], [place] [address]  Glucose, capillary     Status: Abnormal   Collection Time: 10/05/20  2:55 AM  Result Value Ref Range   Glucose-Capillary 121 (H) 70 - 99 mg/dL    Comment: Glucose reference range applies only to samples taken after fasting for at least 8 hours.   Comment 1 Notify RN   Glucose, capillary     Status: Abnormal   Collection Time: 10/05/20  7:35 AM  Result Value Ref Range   Glucose-Capillary 101 (H) 70 - 99 mg/dL    Comment: Glucose reference range applies only to samples taken after fasting for at least 8  hours.   Comment 1 Notify RN    Comment 2 Document in Chart   Glucose, capillary     Status: Abnormal   Collection Time: 10/05/20 11:51 AM  Result Value Ref Range   Glucose-Capillary 101 (H) 70 - 99 mg/dL    Comment: Glucose reference range applies only to samples taken after fasting for at least 8 hours.   Comment 1 Notify RN    Comment 2 Document in Chart    DG Abd 1 View  Result Date: 10/04/2020 CLINICAL DATA:  Generalized abdominal pain for 1 day EXAM: ABDOMEN - 1 VIEW COMPARISON:  08/27/2019 FINDINGS: Scattered large and small bowel gas is noted. No obstructive changes are seen. No free air is noted. Left hip replacement is again noted and stable. No acute bony abnormality is seen. Mild degenerative changes of lumbar spine are. IMPRESSION: No acute abnormality in the abdomen. Electronically Signed   By: Inez Catalina M.D.   On: 10/04/2020 19:37   US RENAL  Result Date: 10/03/2020 CLINICAL DATA:  Acute renal insufficiency EXAM: RENAL / URINARY TRACT ULTRASOUND COMPLETE COMPARISON:  08/28/2019, 01/03/2019 FINDINGS: Right Kidney: Renal measurements: 10.9 x 5.2 x 4.5 cm = volume: 133.3 mL. Echogenicity within normal limits. No mass or hydronephrosis visualized. Left Kidney: Renal measurements: 10.4 x 5.0 x 4.6 cm = volume: 126.3 mL. Echogenicity within  normal limits. No mass or hydronephrosis visualized. Bladder: Appears normal for degree of bladder distention. Other: None. IMPRESSION: 1. Unremarkable renal ultrasound. Electronically Signed   By: Randa Ngo M.D.   On: 10/03/2020 19:33   DG CHEST PORT 1 VIEW  Result Date: 10/05/2020 CLINICAL DATA:  Respiratory failure.  Fever EXAM: PORTABLE CHEST 1 VIEW COMPARISON:  09/25/2020 FINDINGS: Improvement in right lower lobe airspace disease and right effusion. Moderate left lower lobe airspace disease unchanged. Pulmonary vascularity normal. IMPRESSION: Improvement in right lower lobe airspace disease. No change in moderate left lower lobe airspace  disease. Electronically Signed   By: Franchot Gallo M.D.   On: 10/05/2020 10:27    Assessment/Plan HTN DM2 with neuropathy Morbid obesity PAF on Eliquis OSA COPD PMH PEA arrest 10/20 Acute on chronic combined systolic/diastolic heart failure Acute on chronic respiratory failure  Diarrhea Chronic pain  New upper abdominal mass  - exam is not consistent with a boil, abscess, or hernia. It feels fixed and hard, like bone.  Will order CT chest and abdomen W/O contrast to further evaluate   Jill Alexanders, [facility] Surgery Please see Amion for pager number during day hours 7:00am-4:30pm 10/05/2020, 11:52 AM

## 2020-10-05 NOTE — TOC Progression Note (Signed)
Transition of Care [facility]) - Progression Note    Patient Details  Name: Nicholas Caldwell MRN: [id number] Date of Birth: 03/09/1946  Transition of Care [facility]) CM/SW [place], [place] Phone Number: 10/05/2020, 3:28 PM  Clinical Narrative:    Update: CSW reached out to [name], she states she does not want [facility], if [facility] can't take pt then she will take him home with [facility] services.   CSW was able to speak with pt's wife at bedside. [name] states she is agreeable to pt going to SNF. [name] has a preference for [facility]. CSW reached out to [facility], due to a covid outbreak today, admits are on hold. CSW reached out to [facility], due to staffing issues admissions are on hold. CSW reached out to [facility], they are able to accept pt under his [place]. [facility] currently reviewing referral.    CSW reached out to [facility], pt is not managed by them, SNF will need to start auth.         Expected Discharge Plan and Services                                                 Social Determinants of Health (SDOH) Interventions    Readmission Risk Interventions Readmission Risk Prevention Plan 08/15/2019 06/13/2019 04/14/2019  Transportation Screening Complete Complete Complete  Medication Review Press photographer) Not Complete Complete Complete  Med Review Comments requested discharge medication reconciliation by attending - -  PCP or Specialist appointment within 3-5 days of discharge Not Complete Complete Complete  PCP/Specialist Appt Not Complete comments [facility] and will contact pt directly regarding follow up appt - 6/17 with cardiology  [place] or Home Care Consult Complete Complete Complete  SW Recovery Care/Counseling Consult - Complete Complete  Palliative Care Screening - Not Applicable Not [place] - Not Applicable Not Applicable  Some recent data might be hidden

## 2020-10-06 DIAGNOSIS — J449 Chronic obstructive pulmonary disease, unspecified: Secondary | ICD-10-CM

## 2020-10-06 LAB — BASIC METABOLIC PANEL
Anion gap: 19 — ABNORMAL HIGH (ref 5–15)
BUN: 91 mg/dL — ABNORMAL HIGH (ref 8–23)
CO2: 35 mmol/L — ABNORMAL HIGH (ref 22–32)
Calcium: 8.7 mg/dL — ABNORMAL LOW (ref 8.9–10.3)
Chloride: 84 mmol/L — ABNORMAL LOW (ref 98–111)
Creatinine, Ser: 2.84 mg/dL — ABNORMAL HIGH (ref 0.61–1.24)
GFR, Estimated: 23 mL/min — ABNORMAL LOW (ref >60)
Glucose, Bld: 181 mg/dL — ABNORMAL HIGH (ref 70–99)
Potassium: 2.9 mmol/L — ABNORMAL LOW (ref 3.5–5.1)
Sodium: 138 mmol/L (ref 135–145)

## 2020-10-06 LAB — GLUCOSE, CAPILLARY
Glucose-Capillary: 109 mg/dL — ABNORMAL HIGH (ref 70–99)
Glucose-Capillary: 128 mg/dL — ABNORMAL HIGH (ref 70–99)
Glucose-Capillary: 140 mg/dL — ABNORMAL HIGH (ref 70–99)
Glucose-Capillary: 159 mg/dL — ABNORMAL HIGH (ref 70–99)
Glucose-Capillary: 195 mg/dL — ABNORMAL HIGH (ref 70–99)

## 2020-10-06 MED ORDER — METRONIDAZOLE 500 MG PO TABS
500.0000 mg | ORAL_TABLET | Freq: Three times a day (TID) | ORAL | Status: DC
  Administered 2020-10-06 – 2020-10-07 (×3): 500 mg via ORAL
  Filled 2020-10-06 (×3): qty 1

## 2020-10-06 MED ORDER — SODIUM CHLORIDE 0.9 % IV SOLN
2.0000 g | INTRAVENOUS | Status: DC
  Administered 2020-10-06: 2 g via INTRAVENOUS
  Filled 2020-10-06: qty 2
  Filled 2020-10-06: qty 20

## 2020-10-06 MED ORDER — TORSEMIDE 20 MG PO TABS
40.0000 mg | ORAL_TABLET | Freq: Every day | ORAL | Status: DC
  Administered 2020-10-07: 40 mg via ORAL
  Filled 2020-10-06 (×2): qty 2

## 2020-10-06 MED ORDER — POTASSIUM CHLORIDE 10 MEQ/100ML IV SOLN
10.0000 meq | INTRAVENOUS | Status: AC
  Administered 2020-10-06 (×6): 10 meq via INTRAVENOUS
  Filled 2020-10-06 (×6): qty 100

## 2020-10-06 MED ORDER — POTASSIUM CHLORIDE 20 MEQ PO PACK
40.0000 meq | PACK | ORAL | Status: AC
  Administered 2020-10-06 (×3): 40 meq via ORAL
  Filled 2020-10-06 (×4): qty 2

## 2020-10-06 NOTE — Progress Notes (Signed)
Patient ID: Nicholas Caldwell, male   DOB: 04-21-1946, 74 y.o.   MRN: [id number] S: Doesn't feel well today.  Not eating. O:BP (!) 103/52 (BP Location: Left Arm)   Pulse 77   Temp 98.2 F (36.8 C) (Axillary)   Resp (!) 23   Ht 6' (1.829 m)   Wt 100.3 kg   SpO2 90%   BMI 29.99 kg/m   Intake/Output Summary (Last 24 hours) at 10/06/2020 1029 Last data filed at 10/06/2020 0800 Gross per 24 hour  Intake 120 ml  Output 1800 ml  Net -1680 ml   Intake/Output: I/O last 3 completed shifts: In: 243 [P.O.:240; I.V.:3] Out: 3050 [Urine:3050]  Intake/Output this shift:  Total I/O In: 120 [P.O.:120] Out: -  Weight change: -3.3 kg Gen: chronically ill-appearing male in NAD, slowed mentation CVS: RRR, no rub Resp: poor inspiratory effort, CTA Abd: obese, +BS, soft, NT Ext: no edema  Recent Labs  Lab 09/30/20 0025 10/01/20 1609 10/02/20 0036 10/02/20 1219 10/03/20 0218 10/03/20 1555 10/04/20 0049 10/05/20 0105 10/06/20 0101  NA   < >  --  146* 142 145 144 141 137 138  K   < >  --  2.2* 2.2* 2.8* 3.4* 3.4* 2.8* 2.9*  CL   < >  --  87* 84* 84* 84* 86* 83* 84*  CO2   < >  --  40* 39* 41* 40* 33* 36* 35*  GLUCOSE   < >  --  120* 132* 125* 141* 134* 110* 181*  BUN   < >  --  67* 69* 75* 85* 88* 94* 91*  CREATININE   < >  --  3.11* 3.06* 2.97* 3.25* 3.10* 3.02* 2.84*  CALCIUM   < >  --  8.2* 8.1* 8.9 9.0 8.7* 8.4* 8.7*  PHOS  --  3.5  --   --   --   --   --   --   --    < > = values in this interval not displayed.   Liver Function Tests: No results for input(s): AST, ALT, ALKPHOS, BILITOT, PROT, ALBUMIN in the last 168 hours. No results for input(s): LIPASE, AMYLASE in the last 168 hours. No results for input(s): AMMONIA in the last 168 hours. CBC: Recent Labs  Lab 09/30/20 0025 10/05/20 0105  WBC 6.6 10.3  NEUTROABS 5.3  --   HGB 10.9* 10.5*  HCT 36.8* 33.7*  MCV 86.2 83.6  PLT 278 177   Cardiac Enzymes: No results for input(s): CKTOTAL, CKMB, CKMBINDEX, TROPONINI in the  last 168 hours. CBG: Recent Labs  Lab 10/05/20 1151 10/05/20 1619 10/05/20 1940 10/06/20 0021 10/06/20 0330  GLUCAP 101* 117* 180* 195* 159*    Iron Studies: No results for input(s): IRON, TIBC, TRANSFERRIN, FERRITIN in the last 72 hours. Studies/Results: CT ABDOMEN WO CONTRAST  Result Date: 10/05/2020 CLINICAL DATA:  Upper abdominal and lower chest mass. EXAM: CT CHEST AND ABDOMEN WITHOUT CONTRAST TECHNIQUE: Multidetector CT imaging of the chest and abdomen was performed following the standard protocol without intravenous contrast. COMPARISON:  CT chest without contrast 01/03/2019. CT of the abdomen and pelvis without contrast 08/28/2019 FINDINGS: CT CHEST FINDINGS WITHOUT CONTRAST Cardiovascular: Heart is enlarged, stable. Coronary artery calcifications are present. Atherosclerotic changes are present in the aorta without aneurysm or focal stenosis. Mediastinum/Nodes: Multiple paratracheal and prevascular lymph nodes are similar the prior exam. Bilateral hilar adenopathy is similar the prior exam. Esophagus is within normal limits. Thoracic inlet is normal. Lungs/Pleura: Paraseptal emphysematous changes are  again noted. Bilateral pleural effusions are present with associated atelectasis. New ill-defined airspace disease is present in the lateral aspect of the right upper lobe on image 53 of series 4. Previously noted 3 mm nodule is present on image 57. Areas of dependent atelectasis present in the lower lobes bilaterally. Other scattered areas of ground-glass attenuation are present in the anterior right middle lobe. The airways are patent. 5.5 cm left upper lobe pulmonary nodule is stable. Additional scarring is present both lung apices. Musculoskeletal: Prominent anterior xiphoid process tenting the soft tissues is stable. Ribs are unremarkable. Ankylosis of the lower thoracic spine is noted. No acute osseous abnormality is present. Gynecomastia is noted. CT ABDOMEN FINDINGS WITHOUT CONTRAST  Hepatobiliary: No focal liver abnormality is seen. No gallstones, gallbladder wall thickening, or biliary dilatation. Portal caval lymph nodes are increasing in size. Largest node measures 2.4 cm in short axis. Pancreas: Soft tissue nodule anterior to the proximal body the pancreas on image 65 measuring 2.7 x 2.2 cm is consistent with a prominent enlarged lymph node. Pancreas is otherwise unremarkable. Spleen: Splenules are noted. Adrenals/Urinary Tract: Stable left low-density adrenal lesion present, consistent with adrenal adenoma there is slight stranding about the right kidney without scratched at there is slight stranding about both kidneys without obstruction or mass lesion. Stomach/Bowel: The stomach is within normal limits. Duodenal diverticulum is noted without focal inflammatory change. Visualized small bowel is unremarkable. Visualized portions the colon are within normal limits. Appendix is visualized and normal. Vascular/Lymphatic: Atherosclerotic calcifications are present in the aorta branch vessels without aneurysm. Other: No abdominal wall hernia or abnormality. No abdominopelvic ascites. Musculoskeletal: Lumbar vertebral body heights are within normal limits. IMPRESSION: 1. New ill-defined airspace disease in the lateral aspect of the right upper lobe is concerning for pneumonia. Other scattered areas of ground-glass attenuation are likely related. 2. Stable 5.5 cm left upper lobe pulmonary nodule. 3. Stable mediastinal and hilar adenopathy. 4. Bilateral pleural effusions and associated atelectasis. 5. Cardiomegaly without failure. 6. Coronary artery disease. 7. Stable left adrenal adenoma. 8. Increased size of portacaval and peripancreatic lymph nodes. These are most likely reactive. Recommend short-term follow-up CT of the abdomen and pelvis at 3-6 months. 9. Aortic Atherosclerosis (ICD10-I70.0) and Emphysema (ICD10-J43.9). Electronically Signed   By: San Morelle M.D.   On: 10/05/2020  19:45   DG Abd 1 View  Result Date: 10/04/2020 CLINICAL DATA:  Generalized abdominal pain for 1 day EXAM: ABDOMEN - 1 VIEW COMPARISON:  08/27/2019 FINDINGS: Scattered large and small bowel gas is noted. No obstructive changes are seen. No free air is noted. Left hip replacement is again noted and stable. No acute bony abnormality is seen. Mild degenerative changes of lumbar spine are. IMPRESSION: No acute abnormality in the abdomen. Electronically Signed   By: Inez Catalina M.D.   On: 10/04/2020 19:37   CT CHEST WO CONTRAST  Result Date: 10/05/2020 CLINICAL DATA:  Upper abdominal and lower chest mass. EXAM: CT CHEST AND ABDOMEN WITHOUT CONTRAST TECHNIQUE: Multidetector CT imaging of the chest and abdomen was performed following the standard protocol without intravenous contrast. COMPARISON:  CT chest without contrast 01/03/2019. CT of the abdomen and pelvis without contrast 08/28/2019 FINDINGS: CT CHEST FINDINGS WITHOUT CONTRAST Cardiovascular: Heart is enlarged, stable. Coronary artery calcifications are present. Atherosclerotic changes are present in the aorta without aneurysm or focal stenosis. Mediastinum/Nodes: Multiple paratracheal and prevascular lymph nodes are similar the prior exam. Bilateral hilar adenopathy is similar the prior exam. Esophagus is within normal limits. Thoracic  inlet is normal. Lungs/Pleura: Paraseptal emphysematous changes are again noted. Bilateral pleural effusions are present with associated atelectasis. New ill-defined airspace disease is present in the lateral aspect of the right upper lobe on image 53 of series 4. Previously noted 3 mm nodule is present on image 57. Areas of dependent atelectasis present in the lower lobes bilaterally. Other scattered areas of ground-glass attenuation are present in the anterior right middle lobe. The airways are patent. 5.5 cm left upper lobe pulmonary nodule is stable. Additional scarring is present both lung apices. Musculoskeletal:  Prominent anterior xiphoid process tenting the soft tissues is stable. Ribs are unremarkable. Ankylosis of the lower thoracic spine is noted. No acute osseous abnormality is present. Gynecomastia is noted. CT ABDOMEN FINDINGS WITHOUT CONTRAST Hepatobiliary: No focal liver abnormality is seen. No gallstones, gallbladder wall thickening, or biliary dilatation. Portal caval lymph nodes are increasing in size. Largest node measures 2.4 cm in short axis. Pancreas: Soft tissue nodule anterior to the proximal body the pancreas on image 65 measuring 2.7 x 2.2 cm is consistent with a prominent enlarged lymph node. Pancreas is otherwise unremarkable. Spleen: Splenules are noted. Adrenals/Urinary Tract: Stable left low-density adrenal lesion present, consistent with adrenal adenoma there is slight stranding about the right kidney without scratched at there is slight stranding about both kidneys without obstruction or mass lesion. Stomach/Bowel: The stomach is within normal limits. Duodenal diverticulum is noted without focal inflammatory change. Visualized small bowel is unremarkable. Visualized portions the colon are within normal limits. Appendix is visualized and normal. Vascular/Lymphatic: Atherosclerotic calcifications are present in the aorta branch vessels without aneurysm. Other: No abdominal wall hernia or abnormality. No abdominopelvic ascites. Musculoskeletal: Lumbar vertebral body heights are within normal limits. IMPRESSION: 1. New ill-defined airspace disease in the lateral aspect of the right upper lobe is concerning for pneumonia. Other scattered areas of ground-glass attenuation are likely related. 2. Stable 5.5 cm left upper lobe pulmonary nodule. 3. Stable mediastinal and hilar adenopathy. 4. Bilateral pleural effusions and associated atelectasis. 5. Cardiomegaly without failure. 6. Coronary artery disease. 7. Stable left adrenal adenoma. 8. Increased size of portacaval and peripancreatic lymph nodes. These  are most likely reactive. Recommend short-term follow-up CT of the abdomen and pelvis at 3-6 months. 9. Aortic Atherosclerosis (ICD10-I70.0) and Emphysema (ICD10-J43.9). Electronically Signed   By: San Morelle M.D.   On: 10/05/2020 19:45   DG CHEST PORT 1 VIEW  Result Date: 10/05/2020 CLINICAL DATA:  Respiratory failure.  Fever EXAM: PORTABLE CHEST 1 VIEW COMPARISON:  09/25/2020 FINDINGS: Improvement in right lower lobe airspace disease and right effusion. Moderate left lower lobe airspace disease unchanged. Pulmonary vascularity normal. IMPRESSION: Improvement in right lower lobe airspace disease. No change in moderate left lower lobe airspace disease. Electronically Signed   By: Franchot Gallo M.D.   On: 10/05/2020 10:27   . amiodarone  100 mg Oral Daily  . apixaban  5 mg Oral BID  . atorvastatin  40 mg Oral Daily  . diazepam  2 mg Oral BID  . insulin aspart  0-9 Units Subcutaneous Q4H  . potassium chloride  40 mEq Oral Q4H  . sodium chloride flush  3 mL Intravenous Q12H  . torsemide  40 mg Oral BID    BMET    Component Value Date/Time   NA 138 10/06/2020 0101   K 2.9 (L) 10/06/2020 0101   CL 84 (L) 10/06/2020 0101   CO2 35 (H) 10/06/2020 0101   GLUCOSE 181 (H) 10/06/2020 0101   BUN  91 (H) 10/06/2020 0101   CREATININE 2.84 (H) 10/06/2020 0101   CREATININE 0.76 03/24/2016 1656   CALCIUM 8.7 (L) 10/06/2020 0101   GFRNONAA 23 (L) 10/06/2020 0101   GFRAA 18 (L) 07/25/2020 1553   CBC    Component Value Date/Time   WBC 10.3 10/05/2020 0105   RBC 4.03 (L) 10/05/2020 0105   HGB 10.5 (L) 10/05/2020 0105   HCT 33.7 (L) 10/05/2020 0105   PLT 177 10/05/2020 0105   MCV 83.6 10/05/2020 0105   MCH 26.1 10/05/2020 0105   MCHC 31.2 10/05/2020 0105   RDW 18.5 (H) 10/05/2020 0105   LYMPHSABS 0.6 (L) 09/30/2020 0025   MONOABS 0.6 09/30/2020 0025   EOSABS 0.0 09/30/2020 0025   BASOSABS 0.0 09/30/2020 0025    Assessment/Plan:  1. AKI/CKD Stage IV: Cr at baseline unclear as it  has fluctuated significantly over the past year from 1.6-3.6 with multiple episodes of AKI/CKD related to cardiorenal syndrome.Cron admission was 3.54, increased up to 4.06.Creatinine appears to be improvingsince admission,but rose from2.97to 3.1 following a dose of metolazone 10 mg on 10/03/20.Torsemide was added 12/2 by pulmonology. Today's Cr 2.84. Net output 1.8 L yesterday, weight down to 100.3 kg. 1. Has been having adequate output with no issues urinatingand has diuresed over 17 liters since admission 2. Decrease torsemide from 40 mg BID to 40 mg daily given poor po intake and low K. 3. He is not a suitable candidate for dialysis given his biventricular heart failure (severe RV failure), multiple co-morbidities and poor functional/nutritional status.  Palliative care is involved. 4. Nothing more to add at this point and will sign off.  Please call with any questions or concerns.  He can follow up in our office several weeks after discharge to SNF. 2. Acute hypoxic and hypercarbic respiratory failure:Appears multifactorial 2/2 OSA/OHS, acute on chronic heart failure, COPD. Has been on and off BiPAP and now only on BiPAP at night.Pt does refuse oxygen during the day and not sure he is compliant with BiPAP 3. Acute on chronic combined systolic and diastolic CHF with RV failure. He has diuresed well since admission and netnegative over 14 literssince admission.Weight down from 117.9to 102kg (went up to 122 during admission). Poor RV function, advanced heart failure on board.   4. Fever: Noted overnight, up to 101. Per primary.  5. Pulmonary HTN and RV failure- unsure if this is due to his chronic lung disease or possible PE. Unable to perform CTA due to AKI and doubt he could tolerate V/Q scan.  3. Hypokalemia: K 2.8 today, being repleted with 6 runs of KCl. Continue to monitor.  4. Normocytic anemia:Last Hgb 10.9, around BL of 8.8-11. No evidence of bleeding on  exam. 5. Paroxysmal A ZOX:[id number] in NSR. Per primary 6. DM:per primary 7. H/o PEA arrest: 8. Morbid obesity: 9. Chronic pain: 10. Disposition- overall prognosis is poor given biventricular failure and persistent hypoxic and hypercarbic respiratory failure, declining functional and nutritional status. Palliative care is following.  He is not a suitable candidate for dialysis as he would not tolerate it with his severe RV dysfunction.  Recommend transition to comfort care.  Donetta Potts, MD [employer] [phone]

## 2020-10-06 NOTE — Progress Notes (Signed)
PROGRESS NOTE    Nicholas Caldwell  JME:[id number] DOB: Jun 29, 1946 DOA: 09/25/2020 PCP: [name], MD    Brief Narrative:  Nicholas Caldwell a 74 y.o.malewith medical history ofNISCM, systolic CHF and mod RV failure, DM2, CKD 4, PAF s/p DCCV, COPD on home O2, OHS/ OSA on 3 L O2 and CPAP&CAD.  Per ED notes, he has been more short of breath for about 1 wk and has more fluid on his legs.  Pulse ox was 86% on 4 L. Stayed in 80s on 6 L. Subsequently placed on a non re-breather- pulse ox has been 100%. Given 40 mg IV Lasix. CXR reveals pulmonary edema.  Pt is intermittently non-compliant with BIPAP and O2. He continues to require BIPAP with 70% O2 or NRB with 10 L O2 to maintain SaO2 in the 90's.  Off note, pt on 3-4L [place] at home.  12/3 febrile last night, Tmax 101. A bump noted by nsg at epigastric area.  12/4-bump in xyphoid is chronic deformity based on CT. No surgical intervention needed. No further fevers  Consultants:   pccm, nephrology  Procedures:   Antimicrobials:       Subjective: Quite, denies sob, cp, abd pain.   Objective: Vitals:   10/06/20 0000 10/06/20 0332 10/06/20 0400 10/06/20 0652  BP: 115/66 111/63 109/61   Pulse: 78 77 76   Resp: (!) _0 Temp: 97.9 F (36.6 C) 98 F (36.7 C)    TempSrc: Axillary Axillary    SpO2: 100% 96% 95%   Weight:    100.3 kg  Height:        Intake/Output Summary (Last 24 hours) at 10/06/2020 0800 Last data filed at 10/06/2020 0653 Gross per 24 hour  Intake --  Output 1800 ml  Net -1800 ml   Filed Weights   10/04/20 0433 10/05/20 0531 10/06/20 0652  Weight: 102.2 kg 103.6 kg 100.3 kg    Examination: Quiet, nad Cta, no w/r/r. Decrease bs at bases Regular s1/s2, no gallop Soft nt/nd +bs No edema Mood and affect per wife at baselie   Data Reviewed: I have personally reviewed following labs and imaging studies  CBC: Recent Labs  Lab 09/30/20 0025 10/05/20 0105  WBC 6.6 10.3  NEUTROABS 5.3   --   HGB 10.9* 10.5*  HCT 36.8* 33.7*  MCV 86.2 83.6  PLT 278 229   Basic Metabolic Panel: Recent Labs  Lab 10/01/20 1609 10/02/20 0036 10/02/20 1219 10/02/20 1219 10/03/20 0218 10/03/20 1555 10/04/20 0049 10/05/20 0105 10/06/20 0101  NA  --    < > 142   < > 145 144 141 137 138  K  --    < > 2.2*   < > 2.8* 3.4* 3.4* 2.8* 2.9*  CL  --    < > 84*   < > 84* 84* 86* 83* 84*  CO2  --    < > 39*   < > 41* 40* 33* 36* 35*  GLUCOSE  --    < > 132*   < > 125* 141* 134* 110* 181*  BUN  --    < > 69*   < > 75* 85* 88* 94* 91*  CREATININE  --    < > 3.06*   < > 2.97* 3.25* 3.10* 3.02* 2.84*  CALCIUM  --    < > 8.1*   < > 8.9 9.0 8.7* 8.4* 8.7*  MG 2.0  --  2.0  --   --   --   --   --   --  PHOS 3.5  --   --   --   --   --   --   --   --    < > = values in this interval not displayed.   GFR: Estimated Creatinine Clearance: 28.8 mL/min (A) (by C-G formula based on SCr of 2.84 mg/dL (H)). Liver Function Tests: No results for input(s): AST, ALT, ALKPHOS, BILITOT, PROT, ALBUMIN in the last 168 hours. No results for input(s): LIPASE, AMYLASE in the last 168 hours. No results for input(s): AMMONIA in the last 168 hours. Coagulation Profile: No results for input(s): INR, PROTIME in the last 168 hours. Cardiac Enzymes: No results for input(s): CKTOTAL, CKMB, CKMBINDEX, TROPONINI in the last 168 hours. BNP (last 3 results) No results for input(s): PROBNP in the last 8760 hours. HbA1C: No results for input(s): HGBA1C in the last 72 hours. CBG: Recent Labs  Lab 10/05/20 1151 10/05/20 1619 10/05/20 1940 10/06/20 0021 10/06/20 0330  GLUCAP 101* 117* 180* 195* 159*   Lipid Profile: No results for input(s): CHOL, HDL, LDLCALC, TRIG, CHOLHDL, LDLDIRECT in the last 72 hours. Thyroid Function Tests: No results for input(s): TSH, T4TOTAL, FREET4, T3FREE, THYROIDAB in the last 72 hours. Anemia Panel: No results for input(s): VITAMINB12, FOLATE, FERRITIN, TIBC, IRON, RETICCTPCT in the last  72 hours. Sepsis Labs: No results for input(s): PROCALCITON, LATICACIDVEN in the last 168 hours.  Recent Results (from the past 240 hour(s))  Gastrointestinal Panel by PCR , Stool     Status: Abnormal   Collection Time: 09/30/20 11:42 AM   Specimen: Stool  Result Value Ref Range Status   Campylobacter species NOT DETECTED NOT DETECTED Final   Plesimonas shigelloides NOT DETECTED NOT DETECTED Final   Salmonella species NOT DETECTED NOT DETECTED Final   Yersinia enterocolitica NOT DETECTED NOT DETECTED Final   Vibrio species NOT DETECTED NOT DETECTED Final   Vibrio cholerae NOT DETECTED NOT DETECTED Final   Enteroaggregative E coli (EAEC) NOT DETECTED NOT DETECTED Final   Enteropathogenic E coli (EPEC) DETECTED (A) NOT DETECTED Final    Comment: RESULT CALLED TO, READ BACK BY AND VERIFIED WITH: AMINA MOUHAMUD 10/01/20 AT 1220 BY ACR    Enterotoxigenic E coli (ETEC) NOT DETECTED NOT DETECTED Final   Shiga like toxin producing E coli (STEC) NOT DETECTED NOT DETECTED Final   Shigella/Enteroinvasive E coli (EIEC) NOT DETECTED NOT DETECTED Final   Cryptosporidium NOT DETECTED NOT DETECTED Final   Cyclospora cayetanensis NOT DETECTED NOT DETECTED Final   Entamoeba histolytica NOT DETECTED NOT DETECTED Final   Giardia lamblia NOT DETECTED NOT DETECTED Final   Adenovirus F40/41 NOT DETECTED NOT DETECTED Final   Astrovirus NOT DETECTED NOT DETECTED Final   Norovirus GI/GII NOT DETECTED NOT DETECTED Final   Rotavirus A NOT DETECTED NOT DETECTED Final   Sapovirus (I, II, IV, and V) NOT DETECTED NOT DETECTED Final    Comment: Performed at [facility], [address] [place], [place] [address]         Radiology Studies: CT ABDOMEN WO CONTRAST  Result Date: 10/05/2020 CLINICAL DATA:  Upper abdominal and lower chest mass. EXAM: CT CHEST AND ABDOMEN WITHOUT CONTRAST TECHNIQUE: Multidetector CT imaging of the chest and abdomen was performed following the standard protocol without  intravenous contrast. COMPARISON:  CT chest without contrast 01/03/2019. CT of the abdomen and pelvis without contrast 08/28/2019 FINDINGS: CT CHEST FINDINGS WITHOUT CONTRAST Cardiovascular: Heart is enlarged, stable. Coronary artery calcifications are present. Atherosclerotic changes are present in the aorta without  aneurysm or focal stenosis. Mediastinum/Nodes: Multiple paratracheal and prevascular lymph nodes are similar the prior exam. Bilateral hilar adenopathy is similar the prior exam. Esophagus is within normal limits. Thoracic inlet is normal. Lungs/Pleura: Paraseptal emphysematous changes are again noted. Bilateral pleural effusions are present with associated atelectasis. New ill-defined airspace disease is present in the lateral aspect of the right upper lobe on image 53 of series 4. Previously noted 3 mm nodule is present on image 57. Areas of dependent atelectasis present in the lower lobes bilaterally. Other scattered areas of ground-glass attenuation are present in the anterior right middle lobe. The airways are patent. 5.5 cm left upper lobe pulmonary nodule is stable. Additional scarring is present both lung apices. Musculoskeletal: Prominent anterior xiphoid process tenting the soft tissues is stable. Ribs are unremarkable. Ankylosis of the lower thoracic spine is noted. No acute osseous abnormality is present. Gynecomastia is noted. CT ABDOMEN FINDINGS WITHOUT CONTRAST Hepatobiliary: No focal liver abnormality is seen. No gallstones, gallbladder wall thickening, or biliary dilatation. Portal caval lymph nodes are increasing in size. Largest node measures 2.4 cm in short axis. Pancreas: Soft tissue nodule anterior to the proximal body the pancreas on image 65 measuring 2.7 x 2.2 cm is consistent with a prominent enlarged lymph node. Pancreas is otherwise unremarkable. Spleen: Splenules are noted. Adrenals/Urinary Tract: Stable left low-density adrenal lesion present, consistent with adrenal  adenoma there is slight stranding about the right kidney without scratched at there is slight stranding about both kidneys without obstruction or mass lesion. Stomach/Bowel: The stomach is within normal limits. Duodenal diverticulum is noted without focal inflammatory change. Visualized small bowel is unremarkable. Visualized portions the colon are within normal limits. Appendix is visualized and normal. Vascular/Lymphatic: Atherosclerotic calcifications are present in the aorta branch vessels without aneurysm. Other: No abdominal wall hernia or abnormality. No abdominopelvic ascites. Musculoskeletal: Lumbar vertebral body heights are within normal limits. IMPRESSION: 1. New ill-defined airspace disease in the lateral aspect of the right upper lobe is concerning for pneumonia. Other scattered areas of ground-glass attenuation are likely related. 2. Stable 5.5 cm left upper lobe pulmonary nodule. 3. Stable mediastinal and hilar adenopathy. 4. Bilateral pleural effusions and associated atelectasis. 5. Cardiomegaly without failure. 6. Coronary artery disease. 7. Stable left adrenal adenoma. 8. Increased size of portacaval and peripancreatic lymph nodes. These are most likely reactive. Recommend short-term follow-up CT of the abdomen and pelvis at 3-6 months. 9. Aortic Atherosclerosis (ICD10-I70.0) and Emphysema (ICD10-J43.9). Electronically Signed   By: San Morelle M.D.   On: 10/05/2020 19:45   DG Abd 1 View  Result Date: 10/04/2020 CLINICAL DATA:  Generalized abdominal pain for 1 day EXAM: ABDOMEN - 1 VIEW COMPARISON:  08/27/2019 FINDINGS: Scattered large and small bowel gas is noted. No obstructive changes are seen. No free air is noted. Left hip replacement is again noted and stable. No acute bony abnormality is seen. Mild degenerative changes of lumbar spine are. IMPRESSION: No acute abnormality in the abdomen. Electronically Signed   By: Inez Catalina M.D.   On: 10/04/2020 19:37   CT CHEST WO  CONTRAST  Result Date: 10/05/2020 CLINICAL DATA:  Upper abdominal and lower chest mass. EXAM: CT CHEST AND ABDOMEN WITHOUT CONTRAST TECHNIQUE: Multidetector CT imaging of the chest and abdomen was performed following the standard protocol without intravenous contrast. COMPARISON:  CT chest without contrast 01/03/2019. CT of the abdomen and pelvis without contrast 08/28/2019 FINDINGS: CT CHEST FINDINGS WITHOUT CONTRAST Cardiovascular: Heart is enlarged, stable. Coronary artery calcifications are present.  Atherosclerotic changes are present in the aorta without aneurysm or focal stenosis. Mediastinum/Nodes: Multiple paratracheal and prevascular lymph nodes are similar the prior exam. Bilateral hilar adenopathy is similar the prior exam. Esophagus is within normal limits. Thoracic inlet is normal. Lungs/Pleura: Paraseptal emphysematous changes are again noted. Bilateral pleural effusions are present with associated atelectasis. New ill-defined airspace disease is present in the lateral aspect of the right upper lobe on image 53 of series 4. Previously noted 3 mm nodule is present on image 57. Areas of dependent atelectasis present in the lower lobes bilaterally. Other scattered areas of ground-glass attenuation are present in the anterior right middle lobe. The airways are patent. 5.5 cm left upper lobe pulmonary nodule is stable. Additional scarring is present both lung apices. Musculoskeletal: Prominent anterior xiphoid process tenting the soft tissues is stable. Ribs are unremarkable. Ankylosis of the lower thoracic spine is noted. No acute osseous abnormality is present. Gynecomastia is noted. CT ABDOMEN FINDINGS WITHOUT CONTRAST Hepatobiliary: No focal liver abnormality is seen. No gallstones, gallbladder wall thickening, or biliary dilatation. Portal caval lymph nodes are increasing in size. Largest node measures 2.4 cm in short axis. Pancreas: Soft tissue nodule anterior to the proximal body the pancreas on  image 65 measuring 2.7 x 2.2 cm is consistent with a prominent enlarged lymph node. Pancreas is otherwise unremarkable. Spleen: Splenules are noted. Adrenals/Urinary Tract: Stable left low-density adrenal lesion present, consistent with adrenal adenoma there is slight stranding about the right kidney without scratched at there is slight stranding about both kidneys without obstruction or mass lesion. Stomach/Bowel: The stomach is within normal limits. Duodenal diverticulum is noted without focal inflammatory change. Visualized small bowel is unremarkable. Visualized portions the colon are within normal limits. Appendix is visualized and normal. Vascular/Lymphatic: Atherosclerotic calcifications are present in the aorta branch vessels without aneurysm. Other: No abdominal wall hernia or abnormality. No abdominopelvic ascites. Musculoskeletal: Lumbar vertebral body heights are within normal limits. IMPRESSION: 1. New ill-defined airspace disease in the lateral aspect of the right upper lobe is concerning for pneumonia. Other scattered areas of ground-glass attenuation are likely related. 2. Stable 5.5 cm left upper lobe pulmonary nodule. 3. Stable mediastinal and hilar adenopathy. 4. Bilateral pleural effusions and associated atelectasis. 5. Cardiomegaly without failure. 6. Coronary artery disease. 7. Stable left adrenal adenoma. 8. Increased size of portacaval and peripancreatic lymph nodes. These are most likely reactive. Recommend short-term follow-up CT of the abdomen and pelvis at 3-6 months. 9. Aortic Atherosclerosis (ICD10-I70.0) and Emphysema (ICD10-J43.9). Electronically Signed   By: San Morelle M.D.   On: 10/05/2020 19:45   DG CHEST PORT 1 VIEW  Result Date: 10/05/2020 CLINICAL DATA:  Respiratory failure.  Fever EXAM: PORTABLE CHEST 1 VIEW COMPARISON:  09/25/2020 FINDINGS: Improvement in right lower lobe airspace disease and right effusion. Moderate left lower lobe airspace disease unchanged.  Pulmonary vascularity normal. IMPRESSION: Improvement in right lower lobe airspace disease. No change in moderate left lower lobe airspace disease. Electronically Signed   By: Franchot Gallo M.D.   On: 10/05/2020 10:27        Scheduled Meds: . amiodarone  100 mg Oral Daily  . apixaban  5 mg Oral BID  . atorvastatin  40 mg Oral Daily  . diazepam  2 mg Oral BID  . insulin aspart  0-9 Units Subcutaneous Q4H  . potassium chloride  40 mEq Oral Q4H  . sodium chloride flush  3 mL Intravenous Q12H  . torsemide  40 mg Oral BID  Continuous Infusions: . sodium chloride Stopped (10/02/20 1029)  . potassium chloride      Assessment & Plan:   Principal Problem:   Acute respiratory failure (HCC) Active Problems:   Essential hypertension   Type 2 diabetes mellitus with neuropathy   Morbid obesity (HCC)   PAF (paroxysmal atrial fibrillation) (HCC)   COPD (chronic obstructive pulmonary disease) (HCC)   Acute on chronic combined systolic and diastolic CHF (congestive heart failure) (HCC)   OSA (obstructive sleep apnea)   Acute encephalopathy   Acute respiratory failure with hypoxia and hypercapnia (HCC)   RVF (right ventricular failure) (HCC)   Acute systolic CHF (congestive heart failure) (HCC)   Stage III pressure ulcer of sacral region ([place])   Acute on chronic hypoxic and hypercapneicrespiratory failure: The patient was admitted with respiratory acidosis and hypoxia that required BIPAP. Repeat ABG with the patient on BIPAP demonstrated somewhat improved, but continued respiratory acidosis and hypercarbia.  Per pulmonology the patient need only wear BIPAP at night. He has been intermittently refusing to wear his O2 or BIPAP. He is currently requiring 15 L by [place] to maintain SaO2 in the 90's.   CTA chest was considered to rule out PE, but cannot due to creatinine of 3.69. Due to patient's high O2 requirements  doubt that he could tolerate VQ scan.  12/1-pccm -no evidence of AE COPD.  Persistent hypercapnia multifactorial with volume overload and deconditioning. 12/4-Nephrology had d/'cd Metazolone.   Lasix switched to Torsemide Needs to wear and be compliant with Bipap while sleeping, and wear daytime 02 Limit sedation , have decreased valium from 3x/day to bid. Continue diuresis.    Acute on chronic combined systolic and diastolic CHF (congestive heart failure) with severe RV systolic failure and dilatation: The patient is on 3-4 L of O2 at home. He presented with pulmonary edema, pedal edema and BNP elevation of 3275. 12/4-advanced heart failure improved following  Echo this admission EF 50 to 55%, RV severely reduced with severely elevated RVSP, 68 mmHg. PA severely dilated  Continue diuretics per nephrology, Lasix was switched to torsemide  Weight down  40 pounds, BNP decreasing Needs to improve compliance with home oxygen on BiPAP Per cards may benefit from sildenafil...>will defer to cardiology   Fever-  Wbc up from baseline. Ucx, blood cx pending cxr-Improvement in right lower lobe airspace disease. No change in moderate left lower lobe airspace disease.But CT with new airspace dz in lateral aspect of RUL concerning for pna. ?aspiration. Will start pt on unasyn for now.     hypokalemia:  K2.9 today We will aggressively supplement Monitor levels  Abdominal bump: Chronic deformity based on CT, no surgical intervention needed Surgery signed off   Hypomagnesemia:  Stable. Mag 2.0  Acute encephalopathy: Resolved. Due to hypercarbia and possibly Neurontin which has been held. MS improved   Diarrhea:  Improving some  A/CKD stage 4: Creatinine/GFR was worsening, now slowly improving and stabilizing Nephrology following Lasix IV switch to torsemide p.o. Not a great candidate for HD due to RV dysfunction Nephrology signed off. Call with questions F/u in several weeks after discharge to SNF.   Mildly elevated Troponin: Likely due to  cardiac strain in setting of severe renal disease  Type 2 diabetes mellitus with neuropathy: HBA1c of 09/25/2020 was 6.7.  Blood glucose levels stable Continue to monitor  Morbid obesity: This complicates all cares. Body mass index is 35.26 kg/m.  PAF (paroxysmal atrial fibrillation): Sinus rhythm per EKG 09/25/2020. Monitor on telemetry.  Continue Amiodarone and Apixaban.   Chronic pain: Narcotics are held as them may depress respiration and worsen respiratory acidosis and hypercarbia. He is receiving IV acetaminophen. I will start the patient on a low dose of hydrocodone due to his severe hip pain. Will avoid restarting his 10/325 mg percocets as he takes at time out of concern for respiratory depression and worsening acidosis.   Normocytic anemia: Hb has ranged from 8.8-11 in the past year. Hemoglobin is stable .  H/o PEA arrest in 10/20: Noted. Monitor on telemetry.   DVT prophylaxis: Eliquis Code Status: Full Family Communication: none at bedside  Status is: Inpatient  Remains inpatient appropriate because:IV treatments appropriate due to intensity of illness or inability to take PO   Dispo: The patient is from: Home              Anticipated d/c is to: SNF              Anticipated d/c date is: 1-2 days              Patient currently is not medically stable to d/c.needs iv abx since febrile. Also needs SNF authorization pending. F/u cx's            LOS: 11 days   Time spent: 35 min with >50% on coc   Nolberto Hanlon, MD [facility] Pager [phone]  If 7PM-7AM, please contact night-coverage [email or web address] Password [email or web address] 10/06/2020, 8:00 AM

## 2020-10-07 DIAGNOSIS — R509 Fever, unspecified: Secondary | ICD-10-CM

## 2020-10-07 LAB — GLUCOSE, CAPILLARY
Glucose-Capillary: 137 mg/dL — ABNORMAL HIGH (ref 70–99)
Glucose-Capillary: 144 mg/dL — ABNORMAL HIGH (ref 70–99)
Glucose-Capillary: 148 mg/dL — ABNORMAL HIGH (ref 70–99)
Glucose-Capillary: 154 mg/dL — ABNORMAL HIGH (ref 70–99)
Glucose-Capillary: 161 mg/dL — ABNORMAL HIGH (ref 70–99)
Glucose-Capillary: 180 mg/dL — ABNORMAL HIGH (ref 70–99)
Glucose-Capillary: 225 mg/dL — ABNORMAL HIGH (ref 70–99)

## 2020-10-07 LAB — BASIC METABOLIC PANEL
Anion gap: 15 (ref 5–15)
BUN: 87 mg/dL — ABNORMAL HIGH (ref 8–23)
CO2: 34 mmol/L — ABNORMAL HIGH (ref 22–32)
Calcium: 8.4 mg/dL — ABNORMAL LOW (ref 8.9–10.3)
Chloride: 82 mmol/L — ABNORMAL LOW (ref 98–111)
Creatinine, Ser: 2.79 mg/dL — ABNORMAL HIGH (ref 0.61–1.24)
GFR, Estimated: 23 mL/min — ABNORMAL LOW (ref >60)
Glucose, Bld: 143 mg/dL — ABNORMAL HIGH (ref 70–99)
Potassium: 2.8 mmol/L — ABNORMAL LOW (ref 3.5–5.1)
Sodium: 131 mmol/L — ABNORMAL LOW (ref 135–145)

## 2020-10-07 LAB — CBC
HCT: 32.4 % — ABNORMAL LOW (ref 39.0–52.0)
Hemoglobin: 10.2 g/dL — ABNORMAL LOW (ref 13.0–17.0)
MCH: 26.2 pg (ref 26.0–34.0)
MCHC: 31.5 g/dL (ref 30.0–36.0)
MCV: 83.3 fL (ref 80.0–100.0)
Platelets: 179 10*3/uL (ref 150–400)
RBC: 3.89 MIL/uL — ABNORMAL LOW (ref 4.22–5.81)
RDW: 18.4 % — ABNORMAL HIGH (ref 11.5–15.5)
WBC: 10.1 10*3/uL (ref 4.0–10.5)
nRBC: 0 % (ref 0.0–0.2)

## 2020-10-07 LAB — MAGNESIUM: Magnesium: 2 mg/dL (ref 1.7–2.4)

## 2020-10-07 MED ORDER — GERHARDT'S BUTT CREAM
TOPICAL_CREAM | CUTANEOUS | Status: DC | PRN
  Filled 2020-10-07: qty 1

## 2020-10-07 MED ORDER — POTASSIUM CHLORIDE CRYS ER 20 MEQ PO TBCR
40.0000 meq | EXTENDED_RELEASE_TABLET | ORAL | Status: AC
  Administered 2020-10-07 (×3): 40 meq via ORAL
  Filled 2020-10-07 (×3): qty 2

## 2020-10-07 MED ORDER — INSULIN ASPART 100 UNIT/ML ~~LOC~~ SOLN: 0.0000 [IU] | Freq: Every day | SUBCUTANEOUS | Status: DC

## 2020-10-07 MED ORDER — POTASSIUM CHLORIDE CRYS ER 20 MEQ PO TBCR
40.0000 meq | EXTENDED_RELEASE_TABLET | Freq: Once | ORAL | Status: AC
  Administered 2020-10-07: 40 meq via ORAL
  Filled 2020-10-07: qty 2

## 2020-10-07 MED ORDER — SODIUM CHLORIDE 0.9 % IV SOLN
2.0000 g | INTRAVENOUS | Status: DC
  Administered 2020-10-07 – 2020-10-08 (×2): 2 g via INTRAVENOUS
  Filled 2020-10-07 (×2): qty 20
  Filled 2020-10-07: qty 2

## 2020-10-07 MED ORDER — INSULIN ASPART 100 UNIT/ML ~~LOC~~ SOLN: 0.0000 [IU] | Freq: Three times a day (TID) | SUBCUTANEOUS | Status: DC

## 2020-10-07 MED ORDER — METRONIDAZOLE 500 MG PO TABS
500.0000 mg | ORAL_TABLET | Freq: Three times a day (TID) | ORAL | Status: DC
  Administered 2020-10-07 – 2020-10-09 (×6): 500 mg via ORAL
  Filled 2020-10-07 (×6): qty 1

## 2020-10-07 MED ORDER — RISAQUAD PO CAPS
1.0000 | ORAL_CAPSULE | Freq: Every day | ORAL | Status: DC
  Administered 2020-10-07: 1 via ORAL
  Filled 2020-10-07 (×2): qty 1

## 2020-10-07 MED ORDER — INSULIN ASPART 100 UNIT/ML ~~LOC~~ SOLN
0.0000 [IU] | Freq: Three times a day (TID) | SUBCUTANEOUS | Status: DC
  Administered 2020-10-07: 5 [IU] via SUBCUTANEOUS
  Administered 2020-10-07: 3 [IU] via SUBCUTANEOUS
  Administered 2020-10-08: 2 [IU] via SUBCUTANEOUS
  Administered 2020-10-08: 5 [IU] via SUBCUTANEOUS
  Administered 2020-10-08: 3 [IU] via SUBCUTANEOUS
  Administered 2020-10-09: 2 [IU] via SUBCUTANEOUS
  Administered 2020-10-09 (×2): 3 [IU] via SUBCUTANEOUS

## 2020-10-07 NOTE — Evaluation (Addendum)
Clinical/Bedside Swallow Evaluation Patient Details  Name: Matthew Cina MRN: [id number] Date of Birth: 1946-05-18  Today's Date: 10/07/2020 Time: SLP Start Time (ACUTE ONLY): 0745 SLP Stop Time (ACUTE ONLY): 0805 SLP Time Calculation (min) (ACUTE ONLY): 20 min  Past Medical History:  Past Medical History:  Diagnosis Date  . Atrial flutter ([place])    a. recurrent AFlutter with RVR;  b. Amiodarone Rx started 4/16  . CAD (coronary artery disease)    a. LHC 1/16:  mLAD diffuse disease, pLCx mild disease, dLCx with disease but too small for PCI, RCA ok, EF 25-30%  . Chronic pain   . Chronic systolic CHF (congestive heart failure) ([place])   . COPD (chronic obstructive pulmonary disease) ([place])   . Diabetes mellitus without complication ([place])   . Ex-smoker 11/2017  . Hypercholesteremia   . Hypertension   . NICM (nonischemic cardiomyopathy) ([place])    a.dx 2016. b. 2D echo 06/2016 - Last echo 07/01/16: mod dilated LV, mod LVH, EF 25-30%, mild-mod MR, sev LAE, mild-mod reduced RV systolic function, mild-mod TR, PASP 20mHG.  .Marland KitchenPAF (paroxysmal atrial fibrillation) ([facility])    On amio - ot a candidate for flecainide due to cardiomyopathy, not a candidate for Tikosyn due to prolonged QT, and felt to be a poor candidate for ablation given left atrial size.  . [place] [place]   . Tobacco abuse    Past Surgical History:  Past Surgical History:  Procedure Laterality Date  . BIOPSY  03/11/2019   Procedure: BIOPSY;  Surgeon: CLavena Bullion DO;  [place] [facility]  Service: Gastroenterology;;  . BIOPSY  03/15/2019   Procedure: BIOPSY;  Surgeon: MIrving Copas, MD;  [place] [facility]  Service: Gastroenterology;;  . CARDIOVERSION N/A 07/30/2018   Procedure: CARDIOVERSION;  Surgeon: MLarey Dresser MD;  [facility];  Service: Cardiovascular;  Laterality: N/A;  . COLONOSCOPY N/A 03/11/2019   Procedure: COLONOSCOPY;  Surgeon: CLavena Bullion DO;  [place] [facility]  Service: Gastroenterology;  Laterality: N/A;  . ENTEROSCOPY N/A 03/15/2019   Procedure: ENTEROSCOPY;  Surgeon: MRush LandmarkGTelford Nab, MD;  [place] [facility]  Service: Gastroenterology;  Laterality: N/A;  . ESOPHAGOGASTRODUODENOSCOPY Left 03/11/2019   Procedure: ESOPHAGOGASTRODUODENOSCOPY (EGD);  Surgeon: CLavena Bullion DO;  [facility];  Service: Gastroenterology;  Laterality: Left;  . LEFT AND RIGHT HEART CATHETERIZATION WITH CORONARY ANGIOGRAM N/A 11/06/2014   Procedure: LEFT AND RIGHT HEART CATHETERIZATION WITH CORONARY ANGIOGRAM;  Surgeon: JJettie Booze MD;  [facility];  Service: Cardiovascular;  Laterality: N/A;  . RIGHT/LEFT HEART CATH AND CORONARY ANGIOGRAPHY N/A 12/06/2018   Procedure: RIGHT/LEFT HEART CATH AND CORONARY ANGIOGRAPHY;  Surgeon: MLarey Dresser MD;  [facility];  Service: Cardiovascular;  Laterality: N/A;  . SPLENECTOMY    . SUBMUCOSAL TATTOO INJECTION  03/15/2019   Procedure: SUBMUCOSAL TATTOO INJECTION;  Surgeon: MRush LandmarkGTelford Nab, MD;  [place]  Service: Gastroenterology;;   HPI:  JPeng Thorstensonis a 74y.o. male with medical history of NISCM, systolic CHF and mod RV failure, DM2, CKD 4, PAF s/p DCCV, COPD on home O2, OHS/ OSA on 3 L O2 and CPAP & CAD.  The patient is currently sleepy and unable to give me a detailed history. [facility] but will not tell me why he is in the hospital and falls back to sleep. Per ED notes, he has been more short of breath for about 1  wk and has more fluid on his legs.  He was found to be in acute on chronic respiratory and heart failure with encephalopathy.  Most recent chest CT was showing new ill-defined airspace disease in the lateral aspect of the right upper lobe is concerning for pneumonia. Other scattered areas of ground-glass attenuation are likely related. Stable 5.5 cm left upper lobe ulmonary nodule.  Stable mediastinal and  hilar adenopathy.  Bilateral pleural effusions and associated atelectasis.   Assessment / Plan / Recommendation Clinical Impression  Clinical swallowing evaluation was completed using thin liquids via cup and straw, pureed material and dual textured solids.  Patient is known to [facility] service from previous admissions.  He had 3 clinical swallowing evaluations completed in October 2020 all with recommendation for a regular diet with thin liquids.  He was noted to be slow to masticate solids which was likely related to lack of dentition.  Cranial nerve exam completed this date was unremarkable. Lingual, labial, facial and jaw range of motion and strength appeared adequate.  Facial sensation appeared to be intact and he did not endorse a difference in sensation from the right to left side of his face.  RN reporting no obvious issues with intake except for slow mastication and a poor appetite.  He presented with an oral dysphagia that was at least partially related to his lack of dentition.  He did very well orally with the pureed material.  Overt s/s of aspiration were not seen even given serial sips of thin liquids via straw and consuption of over 8ozs of fluid.  Discussion with the patient and decision was made to downgrade his diet to dysphagia 3/mechanical soft with thin liquids.  ST will follow for therapeutic diet tolerance and possible diet advancement.     SLP Visit Diagnosis: Dysphagia, oral phase (R13.11)    Aspiration Risk  Mild aspiration risk    Diet Recommendation   Mechanical Soft with thin liquids.  Medication Administration: Whole meds with liquid    Other  Recommendations Oral Care Recommendations: Oral care BID   Follow up Recommendations Other (comment) (TBD)      Frequency and Duration min 2x/week  2 weeks       Prognosis Prognosis for Safe Diet Advancement: Good      Swallow Study   General Date of Onset: 09/25/20 HPI: Yechezkel Fertig is a 74 y.o. male with medical history  of NISCM, systolic CHF and mod RV failure, DM2, CKD 4, PAF s/p DCCV, COPD on home O2, OHS/ OSA on 3 L O2 and CPAP & CAD.  The patient is currently sleepy and unable to give me a detailed history. He knows he is at [facility] and know his age but will not tell me why he is in the hospital and falls back to sleep. Per ED notes, he has been more short of breath for about 1 wk and has more fluid on his legs.  He was found to be in acute on chronic respiratory and heart failure with encephalopathy.  Most recent chest CT was showing new ill-defined airspace disease in the lateral aspect of the right upper lobe is concerning for pneumonia. Other scattered areas of ground-glass attenuation are likely related. Stable 5.5 cm left upper lobe ulmonary nodule.  Stable mediastinal and hilar adenopathy.  Bilateral pleural effusions and associated atelectasis. Type of Study: Bedside Swallow Evaluation Previous Swallow Assessment: 08/11/2019, 08/21/2019 and 08/26/2019 clinical swallowing evaluations were completed  Diet Prior to this Study: Regular;Thin liquids Temperature Spikes  Noted: Yes Respiratory Status: Nasal cannula History of Recent Intubation: No Behavior/Cognition: Alert;Cooperative Oral Cavity Assessment: Dry Oral Care Completed by SLP: No Oral Cavity - Dentition: Edentulous Vision: Functional for self-feeding Self-Feeding Abilities: Able to feed self Patient Positioning: Upright in bed Baseline Vocal Quality: Low vocal intensity Volitional Swallow: Able to elicit    Oral/Motor/Sensory Function Overall Oral Motor/Sensory Function: Within functional limits   Ice Chips Ice chips: Not tested   Thin Liquid Thin Liquid: Within functional limits Presentation: Cup;Straw;Self Fed    Nectar Thick Nectar Thick Liquid: Not tested   Honey Thick Honey Thick Liquid: Not tested   Puree Puree: Within functional limits Presentation: Self Fed;Spoon   Solid     Solid: Within functional limits Presentation: Louanne Belton, MA, CCC-SLP Acute Rehab SLP [phone]  Lamar Sprinkles 10/07/2020,8:24 AM

## 2020-10-07 NOTE — Progress Notes (Signed)
Progress Note    Nicholas Caldwell  FHS:[id number] DOB: [birth year]  DOA: 09/25/2020 PCP: [name], MD    Brief Narrative:     Medical records reviewed and are as summarized below:  Nicholas Caldwell is an 74 y.o. male with RV failure in the setting of COPD and OHS/OSA.  He wasadmitted with marked volume overload but has diuresed well in the hospital, down about 30 lbs. Nephrology has been managing diuretics and he is back on po meds. Plan for SNF despite comfort care being recommended family wishes to continue aggressive care.    Assessment/Plan:   Principal Problem:   Acute respiratory failure (HCC) Active Problems:   Essential hypertension   Type 2 diabetes mellitus with neuropathy   Morbid obesity (HCC)   PAF (paroxysmal atrial fibrillation) (HCC)   COPD (chronic obstructive pulmonary disease) (HCC)   Acute on chronic combined systolic and diastolic CHF (congestive heart failure) (HCC)   OSA (obstructive sleep apnea)   Acute encephalopathy   Acute respiratory failure with hypoxia and hypercapnia (HCC)   RVF (right ventricular failure) (HCC)   Acute systolic CHF (congestive heart failure) (HCC)   Stage III pressure ulcer of sacral region ([place])   Acute on chronic hypoxic and hypercapneicrespiratory failure: -patient was admitted to with respiratory acidosis and hypoxia that required BIPAP. Repeat ABG with the patient on BIPAP demonstrated somewhat improved, but continued respiratory acidosis and hypercarbia.  Per pulmonology the patient need only wear BIPAP at night. He has been intermittently refusing to wear his O2 or BIPAP Needs to wear and be compliant with Bipap while sleeping, and wear daytime 02 Limit sedation , have decreased valium from 3x/day to bid. Continue diuresis. -wean O2 as tolerated  Acute on chronic combined systolic and diastolic CHF (congestive heart failure) with severe RV systolic failure and dilatation: The patient is on 3-4 L of O2 at home. He  presented with pulmonary edema, pedal edema and BNP elevation of 3275. 12/4-advanced heart failure team following Echo this admission EF 50 to 55%, RV severely reduced with severely elevated RVSP, 68 mmHg. PA severely dilated  Daily weights, I/os Per cards may benefit from sildenafil...>will defer to cardiology  Fever due to suspect aspiration PNA Wbc up from baseline. - on 12/3: CT with new airspace dz in lateral aspect of RUL concerning for pna. ?aspiration. -avoiding unasyn due to "salt load" per pharmacy: will continue flagyl and rocephin -SLP has seen: DYS 3 diet   hypokalemia:  -continue to aggressively replace -check Mg  Hypomagnesemia:  -replete as needed  Acute encephalopathy: Resolved. Due to hypercarbia and possibly Neurontin which has been held.  Diarrhea: -has rectal tube -EPEC positive upon admission  A/CKD stage 4: Creatinine/GFR was worsening, now slowly improving and stabilizing Nephrology consulted: Not a great candidate for HD due to RV dysfunction Lasix IV changed to torsemide p.o. -daily BMP  Type 2 diabetes mellitus with neuropathy: HBA1c: 09/2020 was 6.7.   -SSI  PAF (paroxysmal atrial fibrillation): Sinus rhythm per EKG 09/25/2020. Monitor on telemetry.  Continue Amiodarone and Apixaban.   Chronic pain:  - low dose of hydrocodone due to his severe hip pain - avoid restarting his 10/325 mg percocets as he takes at time out of concern for respiratory depression and worsening acidosis.   Normocytic anemia:  -monitor CBC  obesity Body mass index is 30.77 kg/m.  Sacral pressure injury: Pressure Injury 10/01/20 Sacrum Anterior;Mid Deep Tissue Pressure Injury - Purple or maroon localized area of discolored intact skin  or blood-filled blister due to damage of underlying soft tissue from pressure and/or shear. (Active)  10/01/20 1600  Location: Sacrum  Location Orientation: Anterior;Mid  Staging: Deep Tissue Pressure Injury - Purple or  maroon localized area of discolored intact skin or blood-filled blister due to damage of underlying soft tissue from pressure and/or shear.  Wound Description (Comments):   Present on Admission: No     overall prognosis is poor given biventricular failure and persistent hypoxic and hypercarbic respiratory failure, declining functional and nutritional status.  Palliative care has been working with family and they wish to continue aggressive care despite him not being a candidate for HD   Family Communication/Anticipated D/C date and plan/Code Status   DVT prophylaxis: eliquis Code Status: Full Code.  Disposition Plan: Status is: Inpatient  Remains inpatient appropriate because:Inpatient level of care appropriate due to severity of illness   Dispo: The patient is from: Home              Anticipated d/c is to: SNF              Anticipated d/c date is: 2 days              Patient currently is not medically stable to d/c.- needs agressive replacement of K         Medical Consultants:    PCCM  Palliative care  Nephrology  Cardiology  GS   Subjective:   Did not vocalize complaints except continued right hip pain  Objective:    Vitals:   10/06/20 2221 10/06/20 2300 10/07/20 0300 10/07/20 0800  BP:  97/60 (!) 101/51 (!) 104/58  Pulse:  86 77 81  Resp: (!) 33 18 20 (!) 23  Temp:  97.9 F (36.6 C) 98 F (36.7 C) 100.2 F (37.9 C)  TempSrc:  Oral Oral Axillary  SpO2:  98% 99% 97%  Weight:   102.9 kg   Height:        Intake/Output Summary (Last 24 hours) at 10/07/2020 1114 Last data filed at 10/07/2020 1047 Gross per 24 hour  Intake 1303 ml  Output 2350 ml  Net -1047 ml   Filed Weights   10/05/20 0531 10/06/20 0652 10/07/20 0300  Weight: 103.6 kg 100.3 kg 102.9 kg    Exam:  General: Appearance:    chronically ill appearing male in no acute distress     Lungs:     On [place], poor effort, respirations unlabored  Heart:    Normal heart rate.  No murmurs,  rubs, or gallops.      Neurologic:   Awake, alert, appears withdrawn     Data Reviewed:   I have personally reviewed following labs and imaging studies:  Labs: Labs show the following:   Basic Metabolic Panel: Recent Labs  Lab 10/01/20 1609 10/02/20 0036 10/02/20 1219 10/03/20 0218 10/03/20 1555 10/03/20 1555 10/04/20 0049 10/04/20 0049 10/05/20 0105 10/05/20 0105 10/06/20 0101 10/07/20 0045  NA  --    < > 142   < > 144  --  141  --  137  --  138 131*  K  --    < > 2.2*   < > 3.4*   < > 3.4*   < > 2.8*   < > 2.9* 2.8*  CL  --    < > 84*   < > 84*  --  86*  --  83*  --  84* 82*  CO2  --    < >  39*   < > 40*  --  33*  --  36*  --  35* 34*  GLUCOSE  --    < > 132*   < > 141*  --  134*  --  110*  --  181* 143*  BUN  --    < > 69*   < > 85*  --  88*  --  94*  --  91* 87*  CREATININE  --    < > 3.06*   < > 3.25*  --  3.10*  --  3.02*  --  2.84* 2.79*  CALCIUM  --    < > 8.1*   < > 9.0  --  8.7*  --  8.4*  --  8.7* 8.4*  MG 2.0  --  2.0  --   --   --   --   --   --   --   --   --   PHOS 3.5  --   --   --   --   --   --   --   --   --   --   --    < > = values in this interval not displayed.   GFR Estimated Creatinine Clearance: 29.7 mL/min (A) (by C-G formula based on SCr of 2.79 mg/dL (H)). Liver Function Tests: No results for input(s): AST, ALT, ALKPHOS, BILITOT, PROT, ALBUMIN in the last 168 hours. No results for input(s): LIPASE, AMYLASE in the last 168 hours. No results for input(s): AMMONIA in the last 168 hours. Coagulation profile No results for input(s): INR, PROTIME in the last 168 hours.  CBC: Recent Labs  Lab 10/05/20 0105 10/07/20 0045  WBC 10.3 10.1  HGB 10.5* 10.2*  HCT 33.7* 32.4*  MCV 83.6 83.3  PLT 177 179   Cardiac Enzymes: No results for input(s): CKTOTAL, CKMB, CKMBINDEX, TROPONINI in the last 168 hours. BNP (last 3 results) No results for input(s): PROBNP in the last 8760 hours. CBG: Recent Labs  Lab 10/06/20 1702 10/06/20 2020  10/07/20 0014 10/07/20 0509 10/07/20 0803  GLUCAP 128* 140* 148* 137* 144*   D-Dimer: No results for input(s): DDIMER in the last 72 hours. Hgb A1c: No results for input(s): HGBA1C in the last 72 hours. Lipid Profile: No results for input(s): CHOL, HDL, LDLCALC, TRIG, CHOLHDL, LDLDIRECT in the last 72 hours. Thyroid function studies: No results for input(s): TSH, T4TOTAL, T3FREE, THYROIDAB in the last 72 hours.  Invalid input(s): FREET3 Anemia work up: No results for input(s): VITAMINB12, FOLATE, FERRITIN, TIBC, IRON, RETICCTPCT in the last 72 hours. Sepsis Labs: Recent Labs  Lab 10/05/20 0105 10/07/20 0045  WBC 10.3 10.1    Microbiology Recent Results (from the past 240 hour(s))  Gastrointestinal Panel by PCR , Stool     Status: Abnormal   Collection Time: 09/30/20 11:42 AM   Specimen: Stool  Result Value Ref Range Status   Campylobacter species NOT DETECTED NOT DETECTED Final   Plesimonas shigelloides NOT DETECTED NOT DETECTED Final   Salmonella species NOT DETECTED NOT DETECTED Final   Yersinia enterocolitica NOT DETECTED NOT DETECTED Final   Vibrio species NOT DETECTED NOT DETECTED Final   Vibrio cholerae NOT DETECTED NOT DETECTED Final   Enteroaggregative E coli (EAEC) NOT DETECTED NOT DETECTED Final   Enteropathogenic E coli (EPEC) DETECTED (A) NOT DETECTED Final    Comment: RESULT CALLED TO, READ BACK BY AND VERIFIED WITH: AMINA MOUHAMUD 10/01/20 AT 1220 BY ACR  Enterotoxigenic E coli (ETEC) NOT DETECTED NOT DETECTED Final   Shiga like toxin producing E coli (STEC) NOT DETECTED NOT DETECTED Final   Shigella/Enteroinvasive E coli (EIEC) NOT DETECTED NOT DETECTED Final   Cryptosporidium NOT DETECTED NOT DETECTED Final   Cyclospora cayetanensis NOT DETECTED NOT DETECTED Final   Entamoeba histolytica NOT DETECTED NOT DETECTED Final   Giardia lamblia NOT DETECTED NOT DETECTED Final   Adenovirus F40/41 NOT DETECTED NOT DETECTED Final   Astrovirus NOT DETECTED NOT  DETECTED Final   Norovirus GI/GII NOT DETECTED NOT DETECTED Final   Rotavirus A NOT DETECTED NOT DETECTED Final   Sapovirus (I, II, IV, and V) NOT DETECTED NOT DETECTED Final    Comment: Performed at [facility], [address] [place], [place] [address]  Culture, blood (routine x 2)     Status: None (Preliminary result)   Collection Time: 10/05/20  9:00 AM   Specimen: BLOOD LEFT HAND  Result Value Ref Range Status   Specimen Description BLOOD LEFT HAND  Final   Special Requests   Final    BOTTLES DRAWN AEROBIC AND ANAEROBIC Blood Culture results may not be optimal due to an inadequate volume of blood received in culture bottles   Culture   Final    NO GROWTH 2 DAYS Performed at [facility] [address] [place], [place] [address]    Report Status PENDING  Incomplete  Culture, blood (routine x 2)     Status: None (Preliminary result)   Collection Time: 10/05/20  9:10 AM   Specimen: BLOOD  Result Value Ref Range Status   Specimen Description BLOOD LEFT ANTECUBITAL  Final   Special Requests   Final    BOTTLES DRAWN AEROBIC AND ANAEROBIC Blood Culture adequate volume   Culture   Final    NO GROWTH 2 DAYS Performed at [facility], [place] [address] [place], [place] [address]    Report Status PENDING  Incomplete    Procedures and diagnostic studies:  CT ABDOMEN WO CONTRAST  Result Date: 10/05/2020 CLINICAL DATA:  Upper abdominal and lower chest mass. EXAM: CT CHEST AND ABDOMEN WITHOUT CONTRAST TECHNIQUE: Multidetector CT imaging of the chest and abdomen was performed following the standard protocol without intravenous contrast. COMPARISON:  CT chest without contrast 01/03/2019. CT of the abdomen and pelvis without contrast 08/28/2019 FINDINGS: CT CHEST FINDINGS WITHOUT CONTRAST Cardiovascular: Heart is enlarged, stable. Coronary artery calcifications are present. Atherosclerotic changes are present in the aorta without aneurysm or focal stenosis.  Mediastinum/Nodes: Multiple paratracheal and prevascular lymph nodes are similar the prior exam. Bilateral hilar adenopathy is similar the prior exam. Esophagus is within normal limits. Thoracic inlet is normal. Lungs/Pleura: Paraseptal emphysematous changes are again noted. Bilateral pleural effusions are present with associated atelectasis. New ill-defined airspace disease is present in the lateral aspect of the right upper lobe on image 53 of series 4. Previously noted 3 mm nodule is present on image 57. Areas of dependent atelectasis present in the lower lobes bilaterally. Other scattered areas of ground-glass attenuation are present in the anterior right middle lobe. The airways are patent. 5.5 cm left upper lobe pulmonary nodule is stable. Additional scarring is present both lung apices. Musculoskeletal: Prominent anterior xiphoid process tenting the soft tissues is stable. Ribs are unremarkable. Ankylosis of the lower thoracic spine is noted. No acute osseous abnormality is present. Gynecomastia is noted. CT ABDOMEN FINDINGS WITHOUT CONTRAST Hepatobiliary: No focal liver abnormality is seen. No gallstones, gallbladder wall thickening,  or biliary dilatation. Portal caval lymph nodes are increasing in size. Largest node measures 2.4 cm in short axis. Pancreas: Soft tissue nodule anterior to the proximal body the pancreas on image 65 measuring 2.7 x 2.2 cm is consistent with a prominent enlarged lymph node. Pancreas is otherwise unremarkable. Spleen: Splenules are noted. Adrenals/Urinary Tract: Stable left low-density adrenal lesion present, consistent with adrenal adenoma there is slight stranding about the right kidney without scratched at there is slight stranding about both kidneys without obstruction or mass lesion. Stomach/Bowel: The stomach is within normal limits. Duodenal diverticulum is noted without focal inflammatory change. Visualized small bowel is unremarkable. Visualized portions the colon are  within normal limits. Appendix is visualized and normal. Vascular/Lymphatic: Atherosclerotic calcifications are present in the aorta branch vessels without aneurysm. Other: No abdominal wall hernia or abnormality. No abdominopelvic ascites. Musculoskeletal: Lumbar vertebral body heights are within normal limits. IMPRESSION: 1. New ill-defined airspace disease in the lateral aspect of the right upper lobe is concerning for pneumonia. Other scattered areas of ground-glass attenuation are likely related. 2. Stable 5.5 cm left upper lobe pulmonary nodule. 3. Stable mediastinal and hilar adenopathy. 4. Bilateral pleural effusions and associated atelectasis. 5. Cardiomegaly without failure. 6. Coronary artery disease. 7. Stable left adrenal adenoma. 8. Increased size of portacaval and peripancreatic lymph nodes. These are most likely reactive. Recommend short-term follow-up CT of the abdomen and pelvis at 3-6 months. 9. Aortic Atherosclerosis (ICD10-I70.0) and Emphysema (ICD10-J43.9). Electronically Signed   By: San Morelle M.D.   On: 10/05/2020 19:45   CT CHEST WO CONTRAST  Result Date: 10/05/2020 CLINICAL DATA:  Upper abdominal and lower chest mass. EXAM: CT CHEST AND ABDOMEN WITHOUT CONTRAST TECHNIQUE: Multidetector CT imaging of the chest and abdomen was performed following the standard protocol without intravenous contrast. COMPARISON:  CT chest without contrast 01/03/2019. CT of the abdomen and pelvis without contrast 08/28/2019 FINDINGS: CT CHEST FINDINGS WITHOUT CONTRAST Cardiovascular: Heart is enlarged, stable. Coronary artery calcifications are present. Atherosclerotic changes are present in the aorta without aneurysm or focal stenosis. Mediastinum/Nodes: Multiple paratracheal and prevascular lymph nodes are similar the prior exam. Bilateral hilar adenopathy is similar the prior exam. Esophagus is within normal limits. Thoracic inlet is normal. Lungs/Pleura: Paraseptal emphysematous changes are  again noted. Bilateral pleural effusions are present with associated atelectasis. New ill-defined airspace disease is present in the lateral aspect of the right upper lobe on image 53 of series 4. Previously noted 3 mm nodule is present on image 57. Areas of dependent atelectasis present in the lower lobes bilaterally. Other scattered areas of ground-glass attenuation are present in the anterior right middle lobe. The airways are patent. 5.5 cm left upper lobe pulmonary nodule is stable. Additional scarring is present both lung apices. Musculoskeletal: Prominent anterior xiphoid process tenting the soft tissues is stable. Ribs are unremarkable. Ankylosis of the lower thoracic spine is noted. No acute osseous abnormality is present. Gynecomastia is noted. CT ABDOMEN FINDINGS WITHOUT CONTRAST Hepatobiliary: No focal liver abnormality is seen. No gallstones, gallbladder wall thickening, or biliary dilatation. Portal caval lymph nodes are increasing in size. Largest node measures 2.4 cm in short axis. Pancreas: Soft tissue nodule anterior to the proximal body the pancreas on image 65 measuring 2.7 x 2.2 cm is consistent with a prominent enlarged lymph node. Pancreas is otherwise unremarkable. Spleen: Splenules are noted. Adrenals/Urinary Tract: Stable left low-density adrenal lesion present, consistent with adrenal adenoma there is slight stranding about the right kidney without scratched at there is slight  stranding about both kidneys without obstruction or mass lesion. Stomach/Bowel: The stomach is within normal limits. Duodenal diverticulum is noted without focal inflammatory change. Visualized small bowel is unremarkable. Visualized portions the colon are within normal limits. Appendix is visualized and normal. Vascular/Lymphatic: Atherosclerotic calcifications are present in the aorta branch vessels without aneurysm. Other: No abdominal wall hernia or abnormality. No abdominopelvic ascites. Musculoskeletal: Lumbar  vertebral body heights are within normal limits. IMPRESSION: 1. New ill-defined airspace disease in the lateral aspect of the right upper lobe is concerning for pneumonia. Other scattered areas of ground-glass attenuation are likely related. 2. Stable 5.5 cm left upper lobe pulmonary nodule. 3. Stable mediastinal and hilar adenopathy. 4. Bilateral pleural effusions and associated atelectasis. 5. Cardiomegaly without failure. 6. Coronary artery disease. 7. Stable left adrenal adenoma. 8. Increased size of portacaval and peripancreatic lymph nodes. These are most likely reactive. Recommend short-term follow-up CT of the abdomen and pelvis at 3-6 months. 9. Aortic Atherosclerosis (ICD10-I70.0) and Emphysema (ICD10-J43.9). Electronically Signed   By: San Morelle M.D.   On: 10/05/2020 19:45    Medications:   . amiodarone  100 mg Oral Daily  . apixaban  5 mg Oral BID  . atorvastatin  40 mg Oral Daily  . diazepam  2 mg Oral BID  . insulin aspart  0-9 Units Subcutaneous Q4H  . potassium chloride  40 mEq Oral Once  . potassium chloride  40 mEq Oral Q4H  . sodium chloride flush  3 mL Intravenous Q12H  . torsemide  40 mg Oral Daily   Continuous Infusions: . sodium chloride Stopped (10/02/20 1029)     LOS: 12 days   Geradine Girt  [facility]   How to contact the [facility] Attending or Consulting provider [place] or covering provider during after hours [place], for this patient?  1. Check the care team in [facility] and look for a) attending/consulting [facility] provider listed and b) the [facility] team listed 2. Log into [email or web address] and use [place]'s universal password to access. If you do not have the password, please contact the hospital operator. 3. Locate the [facility] provider you are looking for under [facility] and page to a number that you can be directly reached. 4. If you still have difficulty reaching the provider, please page the [facility] (Director on Call) for the Hospitalists listed on  amion for assistance.  10/07/2020, 11:14 AM

## 2020-10-07 NOTE — Progress Notes (Signed)
Pharmacy Antibiotic Note  Nicholas Caldwell is a 74 y.o. male admitted on 09/25/2020 with suspected aspiration PNA.  Pharmacy has been consulted for Unasyn dosing on 12/4, however Unasyn has high salt load - discussed with Dr. Kurtis Bushman and will change to Ceftriaxone + Flagyl which has similar coverage.  Plan: Ceftriaxone 2g IV q 24 hrs Flagyl 500 mg q 8 hrs. Pharmacy will sign-off, will monitor peripherally.  Height: 6' (182.9 cm) Weight: 102.9 kg (226 lb 13.7 oz) IBW/kg (Calculated) : 77.6  Temp (24hrs), Avg:98.9 F (37.2 C), Min:97.8 F (36.6 C), Max:100.3 F (37.9 C)  Recent Labs  Lab 10/03/20 1555 10/04/20 0049 10/05/20 0105 10/06/20 0101 10/07/20 0045  WBC  --   --  10.3  --  10.1  CREATININE 3.25* 3.10* 3.02* 2.84* 2.79*    Estimated Creatinine Clearance: 29.7 mL/min (A) (by C-G formula based on SCr of 2.79 mg/dL (H)).    Allergies  Allergen Reactions  . Bidil [Isosorb Dinitrate-Hydralazine] Shortness Of Breath  . Benazepril Nausea And Vomiting  . Brimonidine     Other reaction(s): Other (See Comments)  . Metformin     Other reaction(s): Cramps (ALLERGY/intolerance), Other (See Comments)  . Morphine Rash      Thank you for allowing pharmacy to be a part of this patient's care.  [place] Crane, Roylene Reason, [facility] Clinical Pharmacist  10/07/2020 11:26 AM   [facility] pharmacy phone numbers are listed on [email or web address]

## 2020-10-08 ENCOUNTER — Encounter (HOSPITAL_COMMUNITY): Payer: No Typology Code available for payment source

## 2020-10-08 DIAGNOSIS — R531 Weakness: Secondary | ICD-10-CM

## 2020-10-08 DIAGNOSIS — I5043 Acute on chronic combined systolic (congestive) and diastolic (congestive) heart failure: Secondary | ICD-10-CM | POA: Diagnosis not present

## 2020-10-08 LAB — BASIC METABOLIC PANEL
Anion gap: 14 (ref 5–15)
BUN: 83 mg/dL — ABNORMAL HIGH (ref 8–23)
CO2: 37 mmol/L — ABNORMAL HIGH (ref 22–32)
Calcium: 8.6 mg/dL — ABNORMAL LOW (ref 8.9–10.3)
Chloride: 84 mmol/L — ABNORMAL LOW (ref 98–111)
Creatinine, Ser: 2.71 mg/dL — ABNORMAL HIGH (ref 0.61–1.24)
GFR, Estimated: 24 mL/min — ABNORMAL LOW (ref >60)
Glucose, Bld: 157 mg/dL — ABNORMAL HIGH (ref 70–99)
Potassium: 3.1 mmol/L — ABNORMAL LOW (ref 3.5–5.1)
Sodium: 135 mmol/L (ref 135–145)

## 2020-10-08 LAB — CBC
HCT: 30.6 % — ABNORMAL LOW (ref 39.0–52.0)
Hemoglobin: 9.7 g/dL — ABNORMAL LOW (ref 13.0–17.0)
MCH: 26.1 pg (ref 26.0–34.0)
MCHC: 31.7 g/dL (ref 30.0–36.0)
MCV: 82.3 fL (ref 80.0–100.0)
Platelets: 194 10*3/uL (ref 150–400)
RBC: 3.72 MIL/uL — ABNORMAL LOW (ref 4.22–5.81)
RDW: 18.2 % — ABNORMAL HIGH (ref 11.5–15.5)
WBC: 9.1 10*3/uL (ref 4.0–10.5)
nRBC: 0 % (ref 0.0–0.2)

## 2020-10-08 LAB — GLUCOSE, CAPILLARY
Glucose-Capillary: 134 mg/dL — ABNORMAL HIGH (ref 70–99)
Glucose-Capillary: 151 mg/dL — ABNORMAL HIGH (ref 70–99)
Glucose-Capillary: 120 mg/dL — ABNORMAL HIGH (ref 70–99)
Glucose-Capillary: 167 mg/dL — ABNORMAL HIGH (ref 70–99)

## 2020-10-08 LAB — RESP PANEL BY RT-PCR (FLU A&B, COVID) ARPGX2
Influenza A by PCR: NEGATIVE
Influenza B by PCR: NEGATIVE
SARS Coronavirus 2 by RT PCR: NEGATIVE

## 2020-10-08 MED ORDER — POTASSIUM CHLORIDE CRYS ER 20 MEQ PO TBCR: 40.0000 meq | EXTENDED_RELEASE_TABLET | Freq: Once | ORAL | Status: DC

## 2020-10-08 MED ORDER — LIDOCAINE 5 % EX PTCH
1.0000 | MEDICATED_PATCH | CUTANEOUS | Status: DC
  Administered 2020-10-08 – 2020-10-09 (×2): 1 via TRANSDERMAL
  Filled 2020-10-08 (×2): qty 1

## 2020-10-08 MED ORDER — RISAQUAD PO CAPS
2.0000 | ORAL_CAPSULE | Freq: Every day | ORAL | Status: DC
  Administered 2020-10-08 – 2020-10-09 (×2): 2 via ORAL
  Filled 2020-10-08 (×2): qty 2

## 2020-10-08 MED ORDER — ZINC OXIDE 40 % EX OINT
TOPICAL_OINTMENT | Freq: Two times a day (BID) | CUTANEOUS | Status: DC
  Administered 2020-10-08: 1 via TOPICAL
  Filled 2020-10-08: qty 57

## 2020-10-08 MED ORDER — POTASSIUM CHLORIDE CRYS ER 20 MEQ PO TBCR
40.0000 meq | EXTENDED_RELEASE_TABLET | Freq: Every day | ORAL | Status: DC
  Administered 2020-10-08: 40 meq via ORAL
  Filled 2020-10-08: qty 2

## 2020-10-08 MED ORDER — TORSEMIDE 20 MG PO TABS
80.0000 mg | ORAL_TABLET | Freq: Every day | ORAL | Status: DC
  Administered 2020-10-08 – 2020-10-09 (×2): 80 mg via ORAL
  Filled 2020-10-08: qty 4

## 2020-10-08 MED ORDER — POTASSIUM CHLORIDE CRYS ER 20 MEQ PO TBCR
40.0000 meq | EXTENDED_RELEASE_TABLET | Freq: Three times a day (TID) | ORAL | Status: DC
  Administered 2020-10-08 – 2020-10-09 (×4): 40 meq via ORAL
  Filled 2020-10-08 (×4): qty 2

## 2020-10-08 NOTE — Consult Note (Addendum)
[place] Nurse wound follow up Patient receiving care in [facility] 2C13 Wound type: Gluteal cleft/sacrum full thickness with MASD Wound bed: 100% red/pink moist. Patient was soiled with stool leakage from [place]. Would recommend removal of Flexiseal as the patient is needing to be cleaned frequently with or without the Flexiseal. Gerhardt's butt cream has been previously ordered and may but used as needed for MASD.  Measurement: approx 4cm x 1 cm x 0.2 cm Drainage (amount, consistency, odor) sanguinous drainage on foam dressing.  Dressing procedure/placement/frequency: Apply Aquacel Advantage [name] # [phone]) to the sacral wound and cover with foam dressing (placed upside down to prevent stool from getting into the foam dressing). Change daily or PRN soiling. Turn q4h to prevent worsening of wound. Order and place patient on standard air mattress.   Monitor the wound area(s) for worsening of condition such as: Signs/symptoms of infection, increase in size, development of or worsening of odor, development of pain, or increased pain at the affected locations.   Notify the medical team if any of these develop.  Thank you for the consult. [place] nurse will continue to follow weekly.  Please re-consult the [place] team if needed.  Cathlean Marseilles Tamala Julian, MSN, RN, [place], Lysle Pearl, [facility] Wound Treatment Associate Pager [phone]

## 2020-10-08 NOTE — TOC Progression Note (Addendum)
Transition of Care [facility]) - Progression Note    Patient Details  Name: Nicholas Caldwell MRN: [id number] Date of Birth: 04-May-1946  Transition of Care [facility]) CM/SW [place], [place] Phone Number: 10/08/2020, 12:13 PM  Clinical Narrative:     CSW confirmed with Helene Kelp that they are still not accepting admissions due to covid outbreak.   CSW spoke with pt and his wife bed side and explained other offer is [facility]. They are okay with [facility]. CSW explains insurance authorization process and will follow up with family with updates.   CSW spoke with Juliann Pulse at [facility] to confirm bed offer. Juliann Pulse explains they actually don't have male beds until next week.   1404: CSW spoke with admissions at [facility] who confirmed they could accept pt. CSW confirmed with pt and pt wife bedside. [facility] to start insurance auth.   1545: CSW is contacted by [place] and informed they are unable to accept pt due to his wound care needs. Eddie [facility] Is checking with sister facility [facility]. [facility] did start pt's authorization.       Expected Discharge Plan and Services                                                 Social Determinants of Health (SDOH) Interventions    Readmission Risk Interventions Readmission Risk Prevention Plan 08/15/2019 06/13/2019 04/14/2019  Transportation Screening Complete Complete Complete  Medication Review Press photographer) Not Complete Complete Complete  Med Review Comments requested discharge medication reconciliation by attending - -  PCP or Specialist appointment within 3-5 days of discharge Not Complete Complete Complete  PCP/Specialist Appt Not Complete comments [facility] informed [facility] and will contact pt directly regarding follow up appt - 6/17 with cardiology  [place] or Home Care Consult Complete Complete Complete  SW Recovery Care/Counseling Consult - Complete Complete  Palliative Care Screening - Not Applicable Not [place] - Not Applicable Not Applicable  Some recent data might be hidden

## 2020-10-08 NOTE — Progress Notes (Signed)
Occupational Therapy Treatment Patient Details Name: Nicholas Caldwell MRN: [id number] DOB: 01-01-1946 Today's Date: 10/08/2020    History of present illness Pt is a 74 y.o. male with medical history of NISCM, systolic CHF and mod RV failure, DM2, CKD 4, PAF s/p DCCV, COPD on home O2, OHS/ OSA on 3 L O2 and CPAP & CAD.  Pt admitted with acute on chronic hypoxic and hypercapneic resp failure requiring bipap.   OT comments  Patient continues to make minimal to no progress towards goals in skilled OT session. Patient's session encompassed co-treat with PT to lift to chair and provide peri-care. Pt remains max to total A of 2 to roll, and minimally verbal throughout session. Pts blood pressure also noted to be low throughout session, with reading of 93/68 in supine, and 98/46 once positioned in the chair. Discharge remains appropriate, therapy will continue to follow.    Follow Up Recommendations  SNF    Equipment Recommendations  Other (comment) (Has needed DME)    Recommendations for Other Services      Precautions / Restrictions Precautions Precautions: Fall Precaution Comments: O2 sats Restrictions Weight Bearing Restrictions: No       Mobility Bed Mobility Overal bed mobility: Needs Assistance Bed Mobility: Supine to Sit;Sit to Supine;Rolling Rolling: Total assist;+2 for physical assistance;+2 for safety/equipment;Max assist         General bed mobility comments: maxA for turning to L and total A for rolling to R   Transfers   Equipment used: Ambulation equipment used                  Balance Overall balance assessment: Needs assistance Sitting-balance support: Bilateral upper extremity supported Sitting balance-Leahy Scale: Poor Sitting balance - Comments: does not sit without outside support of chair                                   ADL either performed or assessed with clinical judgement   ADL Overall ADL's : Needs  assistance/impaired Eating/Feeding: Set up;Bed level                                   Functional mobility during ADLs: Total assistance;+2 for physical assistance;+2 for safety/equipment General ADL Comments: Pt total A for all activities, with exception of set up for self feeding, again minimally participatory and saying few words     Vision       Perception     Praxis      Cognition Arousal/Alertness: Awake/alert Behavior During Therapy: Flat affect Overall Cognitive Status: Difficult to assess                                 General Comments: pt answering questions with short answers, continues to be self limiting in mobility         Exercises     Shoulder Instructions       General Comments Pt with decreased BP during session, on 8L O2 via HFNC, SaO2 >90%O2, BP low see OT note    Pertinent Vitals/ Pain       Pain Assessment: Faces Faces Pain Scale: Hurts a little bit Pain Location: generalized with movement  Pain Descriptors / Indicators: Discomfort;Grimacing Pain Intervention(s): Limited activity within patient's tolerance;Monitored during session;Repositioned  Home Living  Prior Functioning/Environment              Frequency  Min 2X/week        Progress Toward Goals  OT Goals(current goals can now be found in the care plan section)  Progress towards OT goals: Progressing toward goals  Acute Rehab OT Goals Patient Stated Goal: None stated OT Goal Formulation: Patient unable to participate in goal setting Time For Goal Achievement: 10/12/20 Potential to Achieve Goals: Poor  Plan Discharge plan remains appropriate    Co-evaluation      Reason for Co-Treatment: For patient/therapist safety;Necessary to address cognition/behavior during functional activity PT goals addressed during session: Mobility/safety with mobility OT goals addressed during session:  ADL's and self-care;Strengthening/ROM      AM-PAC OT "6 Clicks" Daily Activity     Outcome Measure   Help from another person eating meals?: A Little Help from another person taking care of personal grooming?: A Lot Help from another person toileting, which includes using toliet, bedpan, or urinal?: Total Help from another person bathing (including washing, rinsing, drying)?: A Lot Help from another person to put on and taking off regular upper body clothing?: A Lot Help from another person to put on and taking off regular lower body clothing?: Total 6 Click Score: 11    End of Session Equipment Utilized During Treatment: Oxygen  OT Visit Diagnosis: Muscle weakness (generalized) (M62.81);Other abnormalities of gait and mobility (R26.89)   Activity Tolerance Patient limited by fatigue;Other (comment) (self limiting and low BP (see note))   Patient Left in chair;with call bell/phone within reach;with family/visitor present   Nurse Communication Mobility status        Time: 7564-3329 OT Time Calculation (min): 37 min  Charges: OT General Charges $OT Visit: 1 Visit OT Treatments $Self Care/Home Management : 8-22 mins  Corinne Ports E. Kenise Barraco, COTA/L Acute Rehabilitation Services [place] 10/08/2020, 2:41 PM

## 2020-10-08 NOTE — Progress Notes (Signed)
Physical Therapy Treatment Patient Details Name: Nicholas Caldwell MRN: [id number] DOB: Nov 01, 1946 Today's Date: 10/08/2020    History of Present Illness Pt is a 74 y.o. male with medical history of NISCM, systolic CHF and mod RV failure, DM2, CKD 4, PAF s/p DCCV, COPD on home O2, OHS/ OSA on 3 L O2 and CPAP & CAD.  Pt admitted with acute on chronic hypoxic and hypercapneic resp failure requiring bipap.    PT Comments    Pt reluctantly agreeable to getting up to recliner with [place]. Pt able to roll toward L with max A with cues to bend R knee and reach across. Pt with increased fatigue requiring total A for rolling onto R side and maintain for pericare due to rectal tube leakage. Sacral pad replaced over sacral wound. Pt lifted to chair. Pt reports increased fatigue. D/c plans remain appropriate at this time. PT will continue to follow acutely.    Follow Up Recommendations  SNF           Precautions / Restrictions Precautions Precautions: Fall Precaution Comments: O2 sats Restrictions Weight Bearing Restrictions: No    Mobility  Bed Mobility Overal bed mobility: Needs Assistance Bed Mobility: Supine to Sit;Sit to Supine;Rolling Rolling: Total assist;+2 for physical assistance;+2 for safety/equipment;Max assist         General bed mobility comments: maxA for turning to L and total A for rolling to R         Balance Overall balance assessment: Needs assistance Sitting-balance support: Bilateral upper extremity supported Sitting balance-Leahy Scale: Poor Sitting balance - Comments: does not sit without outside support of chair                                    Cognition Arousal/Alertness: Awake/alert Behavior During Therapy: Flat affect Overall Cognitive Status: Difficult to assess                                 General Comments: pt answering questions with short answers, continues to be self limiting in mobility          General  Comments General comments (skin integrity, edema, etc.): Pt with decreased BP during session, on 8L O2 via HFNC, SaO2 >90%O2, BP low see OT note      Pertinent Vitals/Pain Pain Assessment: Faces Faces Pain Scale: Hurts a little bit Pain Location: generalized with movement  Pain Descriptors / Indicators: Discomfort;Grimacing Pain Intervention(s): Limited activity within patient's tolerance;Monitored during session;Repositioned           PT Goals (current goals can now be found in the care plan section) Acute Rehab PT Goals Patient Stated Goal: None stated PT Goal Formulation: With patient/family Time For Goal Achievement: 10/12/20 Potential to Achieve Goals: Fair Progress towards PT goals: Not progressing toward goals - comment    Frequency    Min 2X/week      PT Plan Current plan remains appropriate    Co-evaluation PT/OT/SLP Co-Evaluation/Treatment: Yes Reason for Co-Treatment: Necessary to address cognition/behavior during functional activity;For patient/therapist safety PT goals addressed during session: Mobility/safety with mobility        AM-PAC PT "6 Clicks" Mobility   Outcome Measure  Help needed turning from your back to your side while in a flat bed without using bedrails?: A Lot Help needed moving from lying on your back to sitting on the side  of a flat bed without using bedrails?: Total Help needed moving to and from a bed to a chair (including a wheelchair)?: Total Help needed standing up from a chair using your arms (e.g., wheelchair or bedside chair)?: Total Help needed to walk in hospital room?: Total Help needed climbing 3-5 steps with a railing? : Total 6 Click Score: 7    End of Session Equipment Utilized During Treatment: Oxygen Activity Tolerance: Patient limited by fatigue Patient left: with call bell/phone within reach;with family/visitor present;in chair Nurse Communication: Mobility status;Need for lift equipment PT Visit Diagnosis: Other  abnormalities of gait and mobility (R26.89);Muscle weakness (generalized) (M62.81)     Time: 0312-8118 PT Time Calculation (min) (ACUTE ONLY): 37 min  Charges:  $Therapeutic Activity: 8-22 mins                     Yurani Fettes B. Migdalia Dk PT, DPT Acute Rehabilitation Services Pager [phone] Office [phone]    [place] 10/08/2020, 12:43 PM

## 2020-10-08 NOTE — NC FL2 (Addendum)
[place] LEVEL OF CARE SCREENING TOOL     IDENTIFICATION  Patient Name: Nicholas Caldwell Birthdate: 20-Oct-1946 Sex: male Admission Date (Current Location): 09/25/2020  [facility] and [place] Number:  Herbalist and Address:  [facility] [place], [place] [address] [place], [place] [address]      Provider Number: [id number]  Attending Physician Name and Address:  Geradine Girt, DO  Relative Name and Phone Number:  Booker, Bhatnagar (Spouse) [phone] [facility] Phone)    Current Level of Care: Hospital Recommended Level of Care: [place] Prior Approval Number:    Date Approved/Denied:   PASRR Number: [id number]  Discharge Plan: SNF    Current Diagnoses: Patient Active Problem List   Diagnosis Date Noted  . Stage III pressure ulcer of sacral region ([place]) 10/01/2020  . Acute systolic CHF (congestive heart failure) ([place]) 09/25/2020  . Acute respiratory failure with hypercapnia ([place])   . Encounter for intubation   . Pressure injury of skin 08/21/2019  . Cardiac arrest ([place]) 08/20/2019  . Palliative care by specialist   . DNR (do not resuscitate) discussion   . Fatigue   . Acute CHF (congestive heart failure) ([place]) 07/07/2019  . Acute on chronic heart failure ([place]) 06/09/2019  . Lobar pneumonia ([place]) 04/14/2019  . Hyperkalemia 04/10/2019  . Cerebral embolism with cerebral infarction 03/21/2019  . Gastritis and gastroduodenitis   . NSVT (nonsustained ventricular tachycardia) ([place]) 03/08/2019  . Tobacco abuse counseling 03/08/2019  . Iron deficiency anemia   . Acute on chronic systolic CHF (congestive heart failure) ([place]) 03/06/2019  . Diabetes mellitus type 2 in obese ([place])   . Primary osteoarthritis of right hip   . Hypomagnesemia   . Steroid-induced hyperglycemia   . Supplemental oxygen dependent   . Leukocytosis   . Hypokalemia   . Acute on chronic anemia   . Diabetic peripheral neuropathy ([place])   . Acute on  chronic systolic (congestive) heart failure ([place])   . Acute on chronic respiratory failure ([place]) 01/14/2019  . Hypoxia   . Altered mental status   . RVF (right ventricular failure) ([place]) 12/30/2018  . AKI (acute kidney injury) ([place])   . Acute respiratory failure ([place]) 11/22/2018  . Acute on chronic respiratory failure with hypoxia and hypercapnia ([place]) 11/13/2018  . Metabolic encephalopathy 16/05/3709  . Acute respiratory failure with hypoxia and hypercapnia ([place]) 07/26/2018  . Acute exacerbation of CHF (congestive heart failure) ([place]) 07/26/2018  . CHF exacerbation ([place]) 05/01/2018  . CHF (congestive heart failure) ([place]) 04/30/2018  . Chronic respiratory failure with hypoxia ([place]) 12/28/2017  . Acute encephalopathy   . Atrial fibrillation with RVR ([place])   . SVT (supraventricular tachycardia) ([place])   . COPD GOLD 0   . Medically noncompliant   . Panlobular emphysema ([place])   . OSA (obstructive sleep apnea)   . Pulmonary hypertension ([place])   . Disorientation   . Pressure injury of skin 09/07/2016  . Acute on chronic combined systolic and diastolic CHF (congestive heart failure) ([place]) 09/05/2016  . Skin lesion-left heal 09/05/2016  . Chronic systolic CHF (congestive heart failure) ([place]) 07/16/2016  . COPD (chronic obstructive pulmonary disease) ([place]) 07/16/2016  . Uncontrolled type 2 diabetes mellitus with complication ([place])   . Diabetic polyneuropathy associated with diabetes mellitus due to underlying condition ([place])   . Normocytic anemia 06/29/2016  . CAD - Non-obstructive by LHC1/16 01/21/2016  . Prolonged QT interval 10/09/2015  . Nonischemic cardiomyopathy ([place]) 10/09/2015  . PAF (paroxysmal  atrial fibrillation) ([place])   . Chronic pain 02/19/2015  . Morbid obesity ([place]) 02/13/2015  . Dyspnea   . Elevated troponin I level 11/01/2014  . COPD exacerbation ([place]) 11/01/2014  . Essential hypertension 10/31/2014  . Type 2 diabetes mellitus with neuropathy 10/31/2014  .  Hyperlipidemia  10/31/2014  . Cigarette smoker 10/31/2014    Orientation RESPIRATION BLADDER Height & Weight     Self, Situation, Place, Time  O2 (HFNC 7L) External catheter Weight: 227 lb 4.7 oz (103.1 kg) Height:  6' (182.9 cm)  BEHAVIORAL SYMPTOMS/MOOD NEUROLOGICAL BOWEL NUTRITION STATUS      Incontinent Diet (see d/c summary)  AMBULATORY STATUS COMMUNICATION OF NEEDS Skin SacrumAnterior; Mid Deep Tissue Pressure Injury   Extensive Assist Non-Verbally                         Personal Care Assistance Level of Assistance  Bathing, Feeding, Dressing Bathing Assistance: Maximum assistance Feeding assistance: Independent Dressing Assistance: Maximum assistance     Functional Limitations Info  Sight, Hearing, Speech Sight Info: Adequate Hearing Info: Adequate Speech Info: Adequate    SPECIAL CARE FACTORS FREQUENCY  PT (By licensed PT), OT (By licensed OT)     PT Frequency: 5x/week OT Frequency: 5x/week            Contractures Contractures Info: Not present    Additional Factors Info  Code Status, Allergies, Isolation Precautions, Insulin Sliding Scale Code Status Info: Full code Allergies Info: Bidil, metformin, morphine, Brimonidine, benazipril   Insulin Sliding Scale Info: insulin aspart (novoLOG) injection 0-15 Units, insulin aspart (novoLOG) injection 0-5 Units Isolation Precautions Info: Enteric Precautions     Current Medications (10/08/2020):  This is the current hospital active medication list Current Facility-Administered Medications  Medication Dose Route Frequency Provider Last Rate Last Admin  . 0.9 %  sodium chloride infusion  250 mL Intravenous PRN Debbe Odea, MD   Stopped at 10/02/20 1029  . acetaminophen (TYLENOL) tablet 650 mg  650 mg Oral Q4H PRN Debbe Odea, MD   650 mg at 10/04/20 2350  . acidophilus (RISAQUAD) capsule 2 capsule  2 capsule Oral Daily Vann, Jessica U, DO   2 capsule at 10/08/20 1022  . albuterol (PROVENTIL) (2.5  MG/3ML) 0.083% nebulizer solution 2.5 mg  2.5 mg Nebulization Q4H PRN Debbe Odea, MD      . amiodarone (PACERONE) tablet 100 mg  100 mg Oral Daily Debbe Odea, MD   100 mg at 10/08/20 0914  . apixaban (ELIQUIS) tablet 5 mg  5 mg Oral BID Debbe Odea, MD   5 mg at 10/08/20 0914  . atorvastatin (LIPITOR) tablet 40 mg  40 mg Oral Daily Clegg, Amy D, NP   40 mg at 10/08/20 0914  . cefTRIAXone (ROCEPHIN) 2 g in sodium chloride 0.9 % 100 mL IVPB  2 g Intravenous Q24H Vann, Jessica U, DO 200 mL/hr at 10/07/20 1740 2 g at 10/07/20 1740  . diazepam (VALIUM) tablet 2 mg  2 mg Oral BID Nolberto Hanlon, MD   2 mg at 10/08/20 0914  . Gerhardt's butt cream   Topical PRN Eulogio Bear U, DO      . HYDROcodone-acetaminophen (NORCO/VICODIN) 5-325 MG per tablet 1 tablet  1 tablet Oral Q6H PRN Swayze, Ava, DO   1 tablet at 10/08/20 0751  . hydrocortisone valerate cream (WESTCORT) 0.2 %   Topical BID PRN Nolberto Hanlon, MD   1 application at 82/80/03 1211  . insulin aspart (novoLOG) injection  0-15 Units  0-15 Units Subcutaneous TID WC Eulogio Bear U, DO   3 Units at 10/08/20 4730  . insulin aspart (novoLOG) injection 0-5 Units  0-5 Units Subcutaneous QHS Vann, Jessica U, DO      . lidocaine (LIDODERM) 5 % 1 patch  1 patch Transdermal Q24H Eulogio Bear U, DO   1 patch at 10/08/20 1417  . liver oil-zinc oxide (DESITIN) 40 % ointment   Topical BID Geradine Girt, DO   Given at 10/08/20 1420  . metroNIDAZOLE (FLAGYL) tablet 500 mg  500 mg Oral Q8H Vann, Jessica U, DO   500 mg at 10/08/20 1422  . ondansetron (ZOFRAN) injection 4 mg  4 mg Intravenous Q6H PRN Chotiner, Yevonne Aline, MD   4 mg at 10/05/20 1649  . potassium chloride SA (KLOR-CON) CR tablet 40 mEq  40 mEq Oral TID Larey Dresser, MD      . sodium chloride flush (NS) 0.9 % injection 3 mL  3 mL Intravenous Q12H Debbe Odea, MD   3 mL at 10/08/20 0916  . sodium chloride flush (NS) 0.9 % injection 3 mL  3 mL Intravenous PRN Debbe Odea, MD      . Derrill Memo ON  10/09/2020] torsemide (DEMADEX) tablet 80 mg  80 mg Oral Daily Larey Dresser, MD   80 mg at 10/08/20 1022     Discharge Medications: Please see discharge summary for a list of discharge medications.  Relevant Imaging Results:  Relevant Lab Results:   Additional Information SS# [id number]  [place], [place]

## 2020-10-08 NOTE — Progress Notes (Signed)
Patient ID: Nicholas Caldwell, male   DOB: [birth year], 74 y.o.   MRN: [id number]     Advanced Heart Failure Rounding Note  PCP-Cardiologist: No primary care provider on file.   Subjective:    Ate some of his breakfast.  Creatinine continues to come down, 2.71 today.    Ceftriaxone/Flagyl for suspected aspiration PNA.   Has not been out of bed, does not want to go to SNF.   Objective:   Weight Range: 103.1 kg Body mass index is 30.83 kg/m.   Vital Signs:   Temp:  [98.4 F (36.9 C)-99.6 F (37.6 C)] 98.4 F (36.9 C) (12/06 0746) Pulse Rate:  [76-91] 80 (12/06 0746) Resp:  [20-32] 26 (12/06 0746) BP: (104-117)/(56-68) 104/56 (12/06 0746) SpO2:  [92 %-100 %] 97 % (12/06 0746) FiO2 (%):  [60 %] 60 % (12/05 2102) Weight:  [103.1 kg] 103.1 kg (12/06 0341) Last BM Date: 10/07/20  Weight change: Filed Weights   10/06/20 0652 10/07/20 0300 10/08/20 0341  Weight: 100.3 kg 102.9 kg 103.1 kg    Intake/Output:   Intake/Output Summary (Last 24 hours) at 10/08/2020 1014 Last data filed at 10/08/2020 0500 Gross per 24 hour  Intake 1403 ml  Output 1980 ml  Net -577 ml      Physical Exam    General: NAD Neck: No JVD, no thyromegaly or thyroid nodule.  Lungs: Decreased at bases.  CV: Nondisplaced PMI.  Heart regular S1/S2, no S3/S4, no murmur.  No peripheral edema.   Abdomen: Soft, nontender, no hepatosplenomegaly, no distention.  Skin: Intact without lesions or rashes.  Neurologic: Alert and oriented x 3.  Psych: Normal affect. Extremities: No clubbing or cyanosis.  HEENT: Normal.    Telemetry   NSR 70s (personally reviewed).   EKG    N/a   Labs    CBC Recent Labs    10/07/20 0045 10/08/20 0146  WBC 10.1 9.1  HGB 10.2* 9.7*  HCT 32.4* 30.6*  MCV 83.3 82.3  PLT 179 299   Basic Metabolic Panel Recent Labs    10/07/20 0045 10/08/20 0146  NA 131* 135  K 2.8* 3.1*  CL 82* 84*  CO2 34* 37*  GLUCOSE 143* 157*  BUN 87* 83*  CREATININE 2.79* 2.71*  CALCIUM  8.4* 8.6*  MG 2.0  --    Liver Function Tests No results for input(s): AST, ALT, ALKPHOS, BILITOT, PROT, ALBUMIN in the last 72 hours. No results for input(s): LIPASE, AMYLASE in the last 72 hours. Cardiac Enzymes No results for input(s): CKTOTAL, CKMB, CKMBINDEX, TROPONINI in the last 72 hours.  BNP: BNP (last 3 results) Recent Labs    09/25/20 1401 10/01/20 1609 10/05/20 0105  BNP 3,275.6* 3,013.5* 1,180.7*    ProBNP (last 3 results) No results for input(s): PROBNP in the last 8760 hours.   D-Dimer No results for input(s): DDIMER in the last 72 hours. Hemoglobin A1C No results for input(s): HGBA1C in the last 72 hours. Fasting Lipid Panel No results for input(s): CHOL, HDL, LDLCALC, TRIG, CHOLHDL, LDLDIRECT in the last 72 hours. Thyroid Function Tests No results for input(s): TSH, T4TOTAL, T3FREE, THYROIDAB in the last 72 hours.  Invalid input(s): FREET3  Other results:   Imaging    No results found.   Medications:     Scheduled Medications: . acidophilus  2 capsule Oral Daily  . amiodarone  100 mg Oral Daily  . apixaban  5 mg Oral BID  . atorvastatin  40 mg Oral Daily  . diazepam  2 mg Oral BID  . insulin aspart  0-15 Units Subcutaneous TID WC  . insulin aspart  0-5 Units Subcutaneous QHS  . lidocaine  1 patch Transdermal Q24H  . metroNIDAZOLE  500 mg Oral Q8H  . potassium chloride  40 mEq Oral Daily  . sodium chloride flush  3 mL Intravenous Q12H  . [START ON 10/09/2020] torsemide  80 mg Oral Daily    Infusions: . sodium chloride Stopped (10/02/20 1029)  . cefTRIAXone (ROCEPHIN)  IV 2 g (10/07/20 1740)    PRN Medications: sodium chloride, acetaminophen, albuterol, Gerhardt's butt cream, HYDROcodone-acetaminophen, hydrocortisone valerate cream, ondansetron (ZOFRAN) IV, sodium chloride flush     Assessment/Plan   1. Acute on Chronic systolic => diastolic CHF with prominent RV failure: Likely related to COPD/OSA/OHS. H/o poor compliance w/  home O2 and BiPAP. TEE in 3/20 with EF 40-45%, severely dilated RV with moderately decreased systolic function. However, repeat echo in 9/20 showed EF >65% with moderate RV dilation/mildly decreased RV systolic function, and echo in 10/20 showed EF 60-65% with severe RV dilation/severe RV dysfunction. Echo this admit  EF 50-55%, RV severely reduced w/ severely elevated RV systolic pressure, 68 mmHg.  Admitted w/ acute on chronic hypoxic respiratory failure and marked fluid overload 2/2 a/c CHF. Admit BNP 3,275. CXR w/ pulmonary edema. Good diuresis, weight dwon about 33 lbs total.  Now on po diuretics.  On exam, does not look volume overloaded.  Creatinine is below his recent baseline.  - Would increase torsemide to 80 mg daily for now (was on 80 mg bid at home). As po intake increases, may need to increase torsemide.  2. Acute Hypoxic/Hypercarbic Respiratory failure: Due to CHF + COPD/OSA/OHS w/ poor compliance w/ home O2 and BiPAP. Required NRB on admit.  Improved w/ diuresis and oxygenation.  - Continue daytime O2 and BiPAP during sleep.  3. AKI on Stage4 CKD: Followed by nephrology.  Baseline SCr ~3.6, rose to 4.0 this admit.  IMproved to 2.71 today.  4.Atrial fibrillation: Paroxysmal. He has history of CVA. He had DCCV in 3/20 and again in 5/20. He failed amiodarone in the past and cannot take Tikosyn due to baseline prolonged QT interval. However, he was restarted on amiodarone at 10/20 admission.  Had Afib w/ RVR this admit, converted to NSR.   - Continue apixaban 5 mg bid.  - Poor ablation candidate with LA size - Continue amiodarone 100 mg daily.  5. COPD:No longer smokes. Continue homeoxygen. CT chest showed emphysema. - continue daytime O2/Bipap  6.OHS/OSA: Continue 3 L oxygen during the day andBIPAP at night.  7.CAD: Nonobstructive disease in 2/20. No chest pain.  - no ASA due to Eliquis  - Atorvastatin.  8. Hypokalemia - Supp K aggressively  9. ID: Suspect PNA, possibly  related to aspiration.  - He is on ceftriaxone/Flagyl.   PT recommends SNF, patient so far refusing.  Needs mobilization.   We will follow at a distance.  Keep eye on creatinine and volume, but he is stable at this time.   Length of Stay: 86  Loralie Champagne, MD  10/08/2020, 10:14 AM  Advanced Heart Failure Team Pager [phone] (M-F; 7a - 4p)  Please contact [facility] for night-coverage after hours (4p -7a ) and weekends on [email or web address]

## 2020-10-08 NOTE — Progress Notes (Signed)
Progress Note    Nicholas Caldwell  JSE:[id number] DOB: 11/11/1945  DOA: 09/25/2020 PCP: [name], MD    Brief Narrative:     Medical records reviewed and are as summarized below:  Nicholas Caldwell is an 74 y.o. male with RV failure in the setting of COPD and OHS/OSA.  He wasadmitted with marked volume overload but has diuresed well in the hospital, down about 30 lbs. Nephrology has been managing diuretics and he is back on po meds. Plan for SNF despite comfort care being recommended, family wishes to continue aggressive care.    Assessment/Plan:   Principal Problem:   Acute respiratory failure (HCC) Active Problems:   Essential hypertension   Type 2 diabetes mellitus with neuropathy   Morbid obesity (HCC)   PAF (paroxysmal atrial fibrillation) (HCC)   COPD (chronic obstructive pulmonary disease) (HCC)   Acute on chronic combined systolic and diastolic CHF (congestive heart failure) (HCC)   OSA (obstructive sleep apnea)   Acute encephalopathy   Acute respiratory failure with hypoxia and hypercapnia (HCC)   RVF (right ventricular failure) (HCC)   Acute systolic CHF (congestive heart failure) (HCC)   Stage III pressure ulcer of sacral region ([place])   Acute on chronic hypoxic and hypercapneicrespiratory failure: -patient was admitted to with respiratory acidosis and hypoxia that required BIPAP. Repeat ABG with the patient on BIPAP demonstrated somewhat improved, but continued respiratory acidosis and hypercarbia.  Per pulmonology the patient need only wear BIPAP at night. He has been intermittently refusing to wear his O2 or BIPAP Needs to wear and be compliant with Bipap while sleeping, and wear daytime 02 Limit sedating meds, have decreased valium from 3x/day to bid. Continue diuresis per cardiology -wean O2 as tolerated  Acute on chronic combined systolic and diastolic CHF (congestive heart failure) with severe RV systolic failure and dilatation: The patient is on 3-4 L  of O2 at home. He presented with pulmonary edema, pedal edema and BNP elevation of 3275. -advanced heart failure team following Echo this admission EF 50 to 55%, RV severely reduced with severely elevated RVSP, 68 mmHg. PA severely dilated  Daily weights, I/os  Fever due to suspect aspiration PNA Wbc up from baseline. - on 12/3: CT with new airspace dz in lateral aspect of RUL concerning for pna. ?aspiration. -avoiding unasyn due to "salt load" per pharmacy: will continue flagyl and rocephin -SLP has seen: DYS 3 diet-- aspiration precautions   hypokalemia:  -continue to aggressively replace -Mg ok on 12/5  Hypomagnesemia:  -replete as needed  Acute encephalopathy: Resolved. Due to hypercarbia and possibly Neurontin which has been held.  Diarrhea: -has rectal tube -EPEC positive upon admission  AKI/CKD stage 4: Creatinine/GFR was worsening, now slowly improving and stabilizing Nephrology consulted: Not a great candidate for HD due to RV dysfunction Lasix IV changed to torsemide p.o- being adjusted by cardiology -daily BMP  Type 2 diabetes mellitus with neuropathy: HBA1c: 09/2020 was 6.7.   -SSI  PAF (paroxysmal atrial fibrillation): Sinus rhythm per EKG 09/25/2020. Monitor on telemetry.  Continue Amiodarone and Apixaban.   Chronic pain:  - low dose of hydrocodone due to his severe hip pain -add lidocaine patch to right hip - avoid restarting his 10/325 mg percocets as he takes at time out of concern for respiratory depression and worsening acidosis.   Normocytic anemia:  -monitor CBC  obesity Body mass index is 30.83 kg/m.  Sacral pressure injury: Pressure Injury 10/01/20 Sacrum Anterior;Mid Deep Tissue Pressure Injury - Purple or  maroon localized area of discolored intact skin or blood-filled blister due to damage of underlying soft tissue from pressure and/or shear. (Active)  10/01/20 1600  Location: Sacrum  Location Orientation: Anterior;Mid   Staging: Deep Tissue Pressure Injury - Purple or maroon localized area of discolored intact skin or blood-filled blister due to damage of underlying soft tissue from pressure and/or shear.  Wound Description (Comments):   Present on Admission: No     overall prognosis is poor given biventricular failure and persistent hypoxic and hypercarbic respiratory failure, declining functional and nutritional status.  Palliative care has been working with family and they wish to continue aggressive care despite him not being a candidate for HD   Family Communication/Anticipated D/C date and plan/Code Status   DVT prophylaxis: eliquis Code Status: Full Code.  Disposition Plan: Status is: Inpatient  Remains inpatient appropriate because:Inpatient level of care appropriate due to severity of illness   Dispo: The patient is from: Home              Anticipated d/c is to: SNF              Anticipated d/c date is: when ok with cardiology and bed available at SNF                 Medical Consultants:    PCCM  Palliative care: last seen on 12/3: Patient does not engage in discussion. Wife remains hopeful for medical optimization inpatient and planning for discharge to SNF rehab, with hopes he will get stronger and return home. May benefit from outpatient palliative referral. Needs ongoing palliative discussions with high risk for decline and recurrent hospitalizations  Nephrology: signed off 12/4  Cardiology  GS: signed off 12/3   Subjective:   Denies being hungry  Objective:    Vitals:   10/08/20 0000 10/08/20 0243 10/08/20 0341 10/08/20 0746  BP: 109/60 106/68 109/62 (!) 104/56  Pulse: 82 80 80 80  Resp: (!) _0 (!) 26  Temp: 99.3 F (37.4 C) 98.6 F (37 C) 98.7 F (37.1 C) 98.4 F (36.9 C)  TempSrc: Oral Oral Oral Oral  SpO2: 93% 97% 100% 97%  Weight:   103.1 kg   Height:        Intake/Output Summary (Last 24 hours) at 10/08/2020 1021 Last data filed at 10/08/2020  0500 Gross per 24 hour  Intake 1403 ml  Output 1980 ml  Net -577 ml   Filed Weights   10/06/20 0652 10/07/20 0300 10/08/20 0341  Weight: 100.3 kg 102.9 kg 103.1 kg    Exam:  General: Appearance:    Obese male who speaks only a few words     Lungs:      respirations unlabored  Heart:    Normal heart rate. Irr.  No murmurs, rubs, or gallops.   MS:   All extremities are intact.   Neurologic:   Awake, alert, withdrawn     Data Reviewed:   I have personally reviewed following labs and imaging studies:  Labs: Labs show the following:   Basic Metabolic Panel: Recent Labs  Lab 10/01/20 1609 10/02/20 0036 10/02/20 1219 10/03/20 0218 10/04/20 0049 10/04/20 0049 10/05/20 0105 10/05/20 0105 10/06/20 0101 10/06/20 0101 10/07/20 0045 10/08/20 0146  NA  --    < > 142   < > 141  --  137  --  138  --  131* 135  K  --    < > 2.2*   < > 3.4*   < >  2.8*   < > 2.9*   < > 2.8* 3.1*  CL  --    < > 84*   < > 86*  --  83*  --  84*  --  82* 84*  CO2  --    < > 39*   < > 33*  --  36*  --  35*  --  34* 37*  GLUCOSE  --    < > 132*   < > 134*  --  110*  --  181*  --  143* 157*  BUN  --    < > 69*   < > 88*  --  94*  --  91*  --  87* 83*  CREATININE  --    < > 3.06*   < > 3.10*  --  3.02*  --  2.84*  --  2.79* 2.71*  CALCIUM  --    < > 8.1*   < > 8.7*  --  8.4*  --  8.7*  --  8.4* 8.6*  MG 2.0  --  2.0  --   --   --   --   --   --   --  2.0  --   PHOS 3.5  --   --   --   --   --   --   --   --   --   --   --    < > = values in this interval not displayed.   GFR Estimated Creatinine Clearance: 30.6 mL/min (A) (by C-G formula based on SCr of 2.71 mg/dL (H)). Liver Function Tests: No results for input(s): AST, ALT, ALKPHOS, BILITOT, PROT, ALBUMIN in the last 168 hours. No results for input(s): LIPASE, AMYLASE in the last 168 hours. No results for input(s): AMMONIA in the last 168 hours. Coagulation profile No results for input(s): INR, PROTIME in the last 168 hours.  CBC: Recent Labs   Lab 10/05/20 0105 10/07/20 0045 10/08/20 0146  WBC 10.3 10.1 9.1  HGB 10.5* 10.2* 9.7*  HCT 33.7* 32.4* 30.6*  MCV 83.6 83.3 82.3  PLT 177 179 194   Cardiac Enzymes: No results for input(s): CKTOTAL, CKMB, CKMBINDEX, TROPONINI in the last 168 hours. BNP (last 3 results) No results for input(s): PROBNP in the last 8760 hours. CBG: Recent Labs  Lab 10/07/20 1307 10/07/20 1657 10/07/20 2017 10/08/20 0615 10/08/20 0748  GLUCAP 225* 180* 154* 134* 151*   D-Dimer: No results for input(s): DDIMER in the last 72 hours. Hgb A1c: No results for input(s): HGBA1C in the last 72 hours. Lipid Profile: No results for input(s): CHOL, HDL, LDLCALC, TRIG, CHOLHDL, LDLDIRECT in the last 72 hours. Thyroid function studies: No results for input(s): TSH, T4TOTAL, T3FREE, THYROIDAB in the last 72 hours.  Invalid input(s): FREET3 Anemia work up: No results for input(s): VITAMINB12, FOLATE, FERRITIN, TIBC, IRON, RETICCTPCT in the last 72 hours. Sepsis Labs: Recent Labs  Lab 10/05/20 0105 10/07/20 0045 10/08/20 0146  WBC 10.3 10.1 9.1    Microbiology Recent Results (from the past 240 hour(s))  Gastrointestinal Panel by PCR , Stool     Status: Abnormal   Collection Time: 09/30/20 11:42 AM   Specimen: Stool  Result Value Ref Range Status   Campylobacter species NOT DETECTED NOT DETECTED Final   Plesimonas shigelloides NOT DETECTED NOT DETECTED Final   Salmonella species NOT DETECTED NOT DETECTED Final   Yersinia enterocolitica NOT DETECTED NOT DETECTED Final   Vibrio species NOT  DETECTED NOT DETECTED Final   Vibrio cholerae NOT DETECTED NOT DETECTED Final   Enteroaggregative E coli (EAEC) NOT DETECTED NOT DETECTED Final   Enteropathogenic E coli (EPEC) DETECTED (A) NOT DETECTED Final    Comment: RESULT CALLED TO, READ BACK BY AND VERIFIED WITH: AMINA MOUHAMUD 10/01/20 AT 1220 BY ACR    Enterotoxigenic E coli (ETEC) NOT DETECTED NOT DETECTED Final   Shiga like toxin producing E  coli (STEC) NOT DETECTED NOT DETECTED Final   Shigella/Enteroinvasive E coli (EIEC) NOT DETECTED NOT DETECTED Final   Cryptosporidium NOT DETECTED NOT DETECTED Final   Cyclospora cayetanensis NOT DETECTED NOT DETECTED Final   Entamoeba histolytica NOT DETECTED NOT DETECTED Final   Giardia lamblia NOT DETECTED NOT DETECTED Final   Adenovirus F40/41 NOT DETECTED NOT DETECTED Final   Astrovirus NOT DETECTED NOT DETECTED Final   Norovirus GI/GII NOT DETECTED NOT DETECTED Final   Rotavirus A NOT DETECTED NOT DETECTED Final   Sapovirus (I, II, IV, and V) NOT DETECTED NOT DETECTED Final    Comment: Performed at [facility], [address] [place], [place] [address]  Culture, blood (routine x 2)     Status: None (Preliminary result)   Collection Time: 10/05/20  9:00 AM   Specimen: BLOOD LEFT HAND  Result Value Ref Range Status   Specimen Description BLOOD LEFT HAND  Final   Special Requests   Final    BOTTLES DRAWN AEROBIC AND ANAEROBIC Blood Culture results may not be optimal due to an inadequate volume of blood received in culture bottles   Culture   Final    NO GROWTH 3 DAYS Performed at [facility] [address], [place], [place] [address]    Report Status PENDING  Incomplete  Culture, blood (routine x 2)     Status: None (Preliminary result)   Collection Time: 10/05/20  9:10 AM   Specimen: BLOOD  Result Value Ref Range Status   Specimen Description BLOOD LEFT ANTECUBITAL  Final   Special Requests   Final    BOTTLES DRAWN AEROBIC AND ANAEROBIC Blood Culture adequate volume   Culture   Final    NO GROWTH 3 DAYS Performed at [facility], [place] [address] [place],  [address]    Report Status PENDING  Incomplete    Procedures and diagnostic studies:  No results found.  Medications:   . acidophilus  2 capsule Oral Daily  . amiodarone  100 mg Oral Daily  . apixaban  5 mg Oral BID  . atorvastatin  40 mg Oral Daily  . diazepam  2 mg Oral BID  .  insulin aspart  0-15 Units Subcutaneous TID WC  . insulin aspart  0-5 Units Subcutaneous QHS  . lidocaine  1 patch Transdermal Q24H  . metroNIDAZOLE  500 mg Oral Q8H  . potassium chloride  40 mEq Oral Daily  . sodium chloride flush  3 mL Intravenous Q12H  . [START ON 10/09/2020] torsemide  80 mg Oral Daily   Continuous Infusions: . sodium chloride Stopped (10/02/20 1029)  . cefTRIAXone (ROCEPHIN)  IV 2 g (10/07/20 1740)     LOS: 13 days   Geradine Girt  [facility]   How to contact the [facility] Attending or Consulting provider [place] or covering provider during after hours [place], for this patient?  1. Check the care team in [facility] and look for a) attending/consulting [facility] provider listed and b) the [facility] team listed 2. Log  into [email or web address] and use 's universal password to access. If you do not have the password, please contact the hospital operator. 3. Locate the [facility] provider you are looking for under [facility] and page to a number that you can be directly reached. 4. If you still have difficulty reaching the provider, please page the [facility] (Director on Call) for the Hospitalists listed on amion for assistance.  10/08/2020, 10:21 AM

## 2020-10-09 ENCOUNTER — Encounter (HOSPITAL_COMMUNITY): Payer: Self-pay | Admitting: Internal Medicine

## 2020-10-09 DIAGNOSIS — E119 Type 2 diabetes mellitus without complications: Secondary | ICD-10-CM | POA: Diagnosis not present

## 2020-10-09 DIAGNOSIS — L8992 Pressure ulcer of unspecified site, stage 2: Secondary | ICD-10-CM | POA: Diagnosis not present

## 2020-10-09 DIAGNOSIS — J9601 Acute respiratory failure with hypoxia: Secondary | ICD-10-CM | POA: Diagnosis not present

## 2020-10-09 DIAGNOSIS — I270 Primary pulmonary hypertension: Secondary | ICD-10-CM | POA: Diagnosis not present

## 2020-10-09 DIAGNOSIS — L89150 Pressure ulcer of sacral region, unstageable: Secondary | ICD-10-CM | POA: Diagnosis not present

## 2020-10-09 DIAGNOSIS — I5042 Chronic combined systolic (congestive) and diastolic (congestive) heart failure: Secondary | ICD-10-CM | POA: Diagnosis not present

## 2020-10-09 DIAGNOSIS — R5381 Other malaise: Secondary | ICD-10-CM | POA: Diagnosis not present

## 2020-10-09 DIAGNOSIS — I2510 Atherosclerotic heart disease of native coronary artery without angina pectoris: Secondary | ICD-10-CM | POA: Diagnosis not present

## 2020-10-09 DIAGNOSIS — I5043 Acute on chronic combined systolic (congestive) and diastolic (congestive) heart failure: Secondary | ICD-10-CM | POA: Diagnosis not present

## 2020-10-09 DIAGNOSIS — Z7401 Bed confinement status: Secondary | ICD-10-CM | POA: Diagnosis not present

## 2020-10-09 DIAGNOSIS — J9620 Acute and chronic respiratory failure, unspecified whether with hypoxia or hypercapnia: Secondary | ICD-10-CM | POA: Diagnosis not present

## 2020-10-09 DIAGNOSIS — N2581 Secondary hyperparathyroidism of renal origin: Secondary | ICD-10-CM | POA: Diagnosis not present

## 2020-10-09 DIAGNOSIS — I5023 Acute on chronic systolic (congestive) heart failure: Secondary | ICD-10-CM | POA: Diagnosis not present

## 2020-10-09 DIAGNOSIS — M2550 Pain in unspecified joint: Secondary | ICD-10-CM | POA: Diagnosis not present

## 2020-10-09 DIAGNOSIS — J9611 Chronic respiratory failure with hypoxia: Secondary | ICD-10-CM | POA: Diagnosis not present

## 2020-10-09 DIAGNOSIS — I429 Cardiomyopathy, unspecified: Secondary | ICD-10-CM | POA: Diagnosis not present

## 2020-10-09 DIAGNOSIS — I959 Hypotension, unspecified: Secondary | ICD-10-CM | POA: Diagnosis not present

## 2020-10-09 DIAGNOSIS — Z992 Dependence on renal dialysis: Secondary | ICD-10-CM | POA: Diagnosis not present

## 2020-10-09 DIAGNOSIS — N186 End stage renal disease: Secondary | ICD-10-CM | POA: Diagnosis not present

## 2020-10-09 DIAGNOSIS — E785 Hyperlipidemia, unspecified: Secondary | ICD-10-CM | POA: Diagnosis not present

## 2020-10-09 DIAGNOSIS — G9340 Encephalopathy, unspecified: Secondary | ICD-10-CM | POA: Diagnosis not present

## 2020-10-09 DIAGNOSIS — J449 Chronic obstructive pulmonary disease, unspecified: Secondary | ICD-10-CM | POA: Diagnosis not present

## 2020-10-09 DIAGNOSIS — I10 Essential (primary) hypertension: Secondary | ICD-10-CM | POA: Diagnosis not present

## 2020-10-09 DIAGNOSIS — J9602 Acute respiratory failure with hypercapnia: Secondary | ICD-10-CM | POA: Diagnosis not present

## 2020-10-09 DIAGNOSIS — N184 Chronic kidney disease, stage 4 (severe): Secondary | ICD-10-CM | POA: Diagnosis not present

## 2020-10-09 DIAGNOSIS — I4892 Unspecified atrial flutter: Secondary | ICD-10-CM | POA: Diagnosis not present

## 2020-10-09 LAB — BASIC METABOLIC PANEL
Anion gap: 18 — ABNORMAL HIGH (ref 5–15)
BUN: 68 mg/dL — ABNORMAL HIGH (ref 8–23)
CO2: 33 mmol/L — ABNORMAL HIGH (ref 22–32)
Calcium: 8.7 mg/dL — ABNORMAL LOW (ref 8.9–10.3)
Chloride: 83 mmol/L — ABNORMAL LOW (ref 98–111)
Creatinine, Ser: 2.44 mg/dL — ABNORMAL HIGH (ref 0.61–1.24)
GFR, Estimated: 27 mL/min — ABNORMAL LOW (ref >60)
Glucose, Bld: 142 mg/dL — ABNORMAL HIGH (ref 70–99)
Potassium: 3.1 mmol/L — ABNORMAL LOW (ref 3.5–5.1)
Sodium: 134 mmol/L — ABNORMAL LOW (ref 135–145)

## 2020-10-09 LAB — GLUCOSE, CAPILLARY
Glucose-Capillary: 228 mg/dL — ABNORMAL HIGH (ref 70–99)
Glucose-Capillary: 142 mg/dL — ABNORMAL HIGH (ref 70–99)
Glucose-Capillary: 163 mg/dL — ABNORMAL HIGH (ref 70–99)
Glucose-Capillary: 189 mg/dL — ABNORMAL HIGH (ref 70–99)
Glucose-Capillary: 188 mg/dL — ABNORMAL HIGH (ref 70–99)

## 2020-10-09 MED ORDER — ACETAMINOPHEN 325 MG PO TABS: 650.0000 mg | ORAL_TABLET | ORAL | Status: DC | PRN

## 2020-10-09 MED ORDER — ATORVASTATIN CALCIUM 40 MG PO TABS
40.0000 mg | ORAL_TABLET | Freq: Every day | ORAL | Status: DC
Start: 2020-10-10 — End: 2020-10-30

## 2020-10-09 MED ORDER — TAMSULOSIN HCL 0.4 MG PO CAPS: 0.4000 mg | ORAL_CAPSULE | Freq: Every day | ORAL | Status: AC

## 2020-10-09 MED ORDER — INSULIN ASPART 100 UNIT/ML ~~LOC~~ SOLN
0.0000 [IU] | Freq: Every day | SUBCUTANEOUS | 11 refills | Status: DC
Start: 2020-10-09 — End: 2020-10-30

## 2020-10-09 MED ORDER — RISAQUAD PO CAPS
2.0000 | ORAL_CAPSULE | Freq: Every day | ORAL | Status: DC
Start: 2020-10-10 — End: 2020-11-01

## 2020-10-09 MED ORDER — DIAZEPAM 2 MG PO TABS
2.0000 mg | ORAL_TABLET | Freq: Two times a day (BID) | ORAL | 0 refills | Status: DC
Start: 2020-10-09 — End: 2020-11-01

## 2020-10-09 MED ORDER — INSULIN ASPART 100 UNIT/ML ~~LOC~~ SOLN
0.0000 [IU] | Freq: Three times a day (TID) | SUBCUTANEOUS | 11 refills | Status: DC
Start: 2020-10-09 — End: 2021-01-10

## 2020-10-09 MED ORDER — LIDOCAINE 5 % EX PTCH: 1.0000 | MEDICATED_PATCH | CUTANEOUS | 0 refills | Status: DC

## 2020-10-09 MED ORDER — AMOXICILLIN-POT CLAVULANATE 875-125 MG PO TABS
1.0000 | ORAL_TABLET | Freq: Every day | ORAL | Status: DC
Start: 2020-10-09 — End: 2020-11-01

## 2020-10-09 MED ORDER — AMOXICILLIN-POT CLAVULANATE 875-125 MG PO TABS
1.0000 | ORAL_TABLET | Freq: Every day | ORAL | Status: DC
  Administered 2020-10-09: 1 via ORAL
  Filled 2020-10-09: qty 1

## 2020-10-09 MED ORDER — POTASSIUM CHLORIDE CRYS ER 20 MEQ PO TBCR
40.0000 meq | EXTENDED_RELEASE_TABLET | Freq: Three times a day (TID) | ORAL | Status: DC
Start: 2020-10-09 — End: 2020-11-01

## 2020-10-09 MED ORDER — POTASSIUM CHLORIDE CRYS ER 20 MEQ PO TBCR
40.0000 meq | EXTENDED_RELEASE_TABLET | Freq: Once | ORAL | Status: AC
  Administered 2020-10-09: 40 meq via ORAL
  Filled 2020-10-09: qty 2

## 2020-10-09 MED ORDER — HYDROCODONE-ACETAMINOPHEN 5-325 MG PO TABS: 1.0000 | ORAL_TABLET | Freq: Four times a day (QID) | ORAL | 0 refills | Status: AC | PRN

## 2020-10-09 NOTE — Progress Notes (Incomplete)
Third attempt at calling  [email or web address] . No response will try again.

## 2020-10-09 NOTE — Progress Notes (Signed)
  Heart failure team will sign off as of 10/09/20  HF Team Medication Recommendations for Home: Torsemide 80 mg daily.   Follow up as an outpatient: as needed.   Adelise Buswell NP-C

## 2020-10-09 NOTE — Discharge Summary (Addendum)
Physician Discharge Summary  [name] MGQ:[id number] DOB: 10/03/1946 DOA: 09/25/2020  PCP: [name], MD  Admit date: 09/25/2020 Discharge date: 10/09/2020  Admitted From: home Discharge disposition: SNF   Recommendations for Outpatient Follow-Up:   1. CPAP/BIPAP QHS 2. O2 3L or titrated to sat of 90-92% 3. BMP 1 week 4. Palliative care to follow at SNF 5. Daily weights 6. augmentin through 12/11 7. SSI 8. SLP: will need barium swallow outpatient for diet advancement   Discharge Diagnosis:   Principal Problem:   Acute respiratory failure ([place]) Active Problems:   Essential hypertension   Type 2 diabetes mellitus with neuropathy   Morbid obesity (HCC)   PAF (paroxysmal atrial fibrillation) (HCC)   COPD (chronic obstructive pulmonary disease) (HCC)   Acute on chronic combined systolic and diastolic CHF (congestive heart failure) (HCC)   OSA (obstructive sleep apnea)   Acute encephalopathy   Acute respiratory failure with hypoxia and hypercapnia (HCC)   RVF (right ventricular failure) (HCC)   Acute systolic CHF (congestive heart failure) ([place])   Stage III pressure ulcer of sacral region [facility])    Discharge Condition: stable  Diet recommendation: DYS 3 diet- carb mod  Wound care: Apply Aquacel Advantage [name] # [phone]) to the sacral wound and cover with foam dressing (placed upside down to prevent stool from getting into the foam dressing). Change daily or PRN soiling. Turn q4h to prevent worsening of wound. Order and place patient on standard air mattress.   Code status: Full.   History of Present Illness:   Nicholas Caldwell is a 74 y.o. male with medical history of NISCM, systolic CHF and mod RV failure, DM2, CKD 4, PAF s/p DCCV, COPD on home O2, OHS/ OSA on 3 L O2 and CPAP & CAD.  The patient is currently sleepy and unable to give me a detailed history. He knows he is at [facility] and know his age but will not tell me why he is in the hospital and  falls back to sleep. Per ED notes, he has been more short of breath for about 1 wk and has more fluid on his legs.   Hospital Course by Problem:   Acute on chronic hypoxic and hypercapneicrespiratory failure: -patient was admitted to with respiratory acidosis and hypoxia that required BIPAP. Repeat ABG with the patient on BIPAP demonstrated somewhat improved, but continued respiratory acidosis and hypercarbia.  Per pulmonology the patient need only wear BIPAP at night. He has been intermittently refusing to wear his O2 or BIPAP Needs to wear and be compliant with Bipap while sleeping, and wear daytime 02 Limit sedating meds, have decreased valium from 3x/day to bid. Continue diuresis  -wean O2 as tolerated to keep sats 88-90%  Acute on chronic combined systolic and diastolic CHF (congestive heart failure) with severe RV systolic failure and dilatation: The patient is on 3-4 L of O2 at home. He presented with pulmonary edema, pedal edema and BNP elevation of 3275. -advanced heart failure team following Echo this admission EF 50 to 55%, RV severely reduced with severely elevated RVSP, 68 mmHg. PA severely dilated  Daily weights, I/os  Fever due to suspect aspiration PNA -treat with augmentin - on 12/3: CT with new airspace dz in lateral aspect of RUL concerning for pna. ?aspiration. -avoiding unasyn due to "salt load" per pharmacy: will continue flagyl and rocephin -SLP has seen: DYS 3 diet-- aspiration precautions  hypokalemia: -continue to aggressively replace -Mg ok on 12/5  Hypomagnesemia:  -replete  as needed  Acute encephalopathy:Resolved. Due to hypercarbia and possibly Neurontin which has been held.  Diarrhea: -EPEC positive upon admission -slowly lessening lactobacillus   AKI/CKD stage 4:Creatinine/GFR was worsening, now slowly improving and stabilizing Nephrology consulted: Not a great candidate for HD due to RV dysfunction Lasix IV changed to torsemide  p.o- being adjusted by cardiology -weekly BMP  Type 2 diabetes mellitus with neuropathy:HBA1c: 09/2020 was 6.7.  -SSI  PAF (paroxysmal atrial fibrillation): Sinus rhythm per EKG 09/25/2020. Monitor on telemetry. Continue Amiodarone and Apixaban.   Chronic pain: - low dose of hydrocodone due to his severe hip pain -add lidocaine patch to right hip - avoid restarting his 10/325 mg percocets as he takes at time out of concern for respiratory depression and worsening acidosis.   Normocytic anemia: -monitor CBC PRN  obesity Body mass index is 30.83 kg/m.  Sacral pressure injury: Pressure Injury 10/01/20 Sacrum Anterior;Mid Deep Tissue Pressure Injury - Purple or maroon localized area of discolored intact skin or blood-filled blister due to damage of underlying soft tissue from pressure and/or shear. (Active)  10/01/20 1600  Location: Sacrum  Location Orientation: Anterior;Mid  Staging: Deep Tissue Pressure Injury - Purple or maroon localized area of discolored intact skin or blood-filled blister due to damage of underlying soft tissue from pressure and/or shear.  Wound Description (Comments):   Present on Admission: No  -wound care as above   overall prognosis is poor given biventricular failure and persistent hypoxic and hypercarbic respiratory failure, declining functional and nutritional status.  Palliative care has been working with family and they wish to continue aggressive care despite him not being a candidate for HD      Medical Consultants:   Cards Palliative care GS   Discharge Exam:   Vitals:   10/09/20 0300 10/09/20 0759  BP: (!) 109/56 (!) 103/57  Pulse: 82 66  Resp: 20 17  Temp: (!) 97.5 F (36.4 C) 98.1 F (36.7 C)  SpO2: 99% 92%   Vitals:   10/08/20 2348 10/09/20 0015 10/09/20 0300 10/09/20 0759  BP: 97/62  (!) 109/56 (!) 103/57  Pulse: 79  82 66  Resp: _0 Temp: 98.7 F (37.1 C)  (!) 97.5 F (36.4 C) 98.1 F (36.7 C)   TempSrc: Oral  Oral Axillary  SpO2: 98% 98% 99% 92%  Weight:   101.5 kg   Height:        General exam: Appears calm and comfortable. - chronically ill appearing    The results of significant diagnostics from this hospitalization (including imaging, microbiology, ancillary and laboratory) are listed below for reference.     Procedures and Diagnostic Studies:   DG Chest Portable 1 View  Result Date: 09/25/2020 CLINICAL DATA:  Shortness of breath EXAM: PORTABLE CHEST 1 VIEW COMPARISON:  06/21/2020 FINDINGS: Cardiomegaly. Atherosclerotic calcification of the aortic knob. Pulmonary vascular congestion with diffuse bilateral interstitial prominence. Patchy bibasilar opacities. Small bilateral pleural effusions. No pneumothorax. Degenerative changes of the right shoulder. IMPRESSION: 1. Findings suggestive of CHF with pulmonary edema. A superimposed infectious process would be difficult to exclude. 2. Small bilateral pleural effusions. Electronically Signed   By: Davina Poke D.O.   On: 09/25/2020 14:42   ECHOCARDIOGRAM COMPLETE  Result Date: 09/26/2020    ECHOCARDIOGRAM REPORT   Patient Name:   Nicholas Caldwell Date of Exam: 09/26/2020 Medical Rec #:  [id number]    Height:       72.0 in Accession #:    [id number]  Weight:       268.3 lb Date of Birth:  [birth year]    BSA:          2.414 m Patient Age:    89 years     BP:           103/64 mmHg Patient Gender: M            HR:           67 bpm. Exam Location:  Inpatient Procedure: 2D Echo Indications:    CHF 428  History:        Patient has prior history of Echocardiogram examinations, most                 recent 08/21/2019. CHF, CAD, COPD, Arrythmias:Atrial Flutter;                 Risk Factors:Hypertension, Dyslipidemia, Diabetes and Former                 Smoker. NICM. Pulmonary HTN. PAF. Tobacco Abuse. Hx of                 Cardioversion.  Sonographer:    [name] RDCS (AE) Referring Phys: [id number] [facility]  Sonographer Comments: Technically  difficult study due to poor echo windows, suboptimal parasternal window, suboptimal apical window and suboptimal subcostal window. Image acquisition challenging due to patient body habitus and restricted mobility. IMPRESSIONS  1. Left ventricular ejection fraction, by estimation, is 50 to 55%. The left ventricle has low normal function. The left ventricle has no regional wall motion abnormalities. Left ventricular diastolic function could not be evaluated.  2. Right ventricular systolic function is severely reduced. The right ventricular size is severely enlarged. There is severely elevated pulmonary artery systolic pressure. The estimated right ventricular systolic pressure is 62.9 mmHg.  3. Right atrial size was severely dilated.  4. The mitral valve was not well visualized. Trivial mitral valve regurgitation.  5. Tricuspid valve regurgitation is moderate to severe.  6. The aortic valve was not well visualized. Aortic valve regurgitation is not visualized.  7. Severely dilated pulmonary artery. Comparison(s): No significant change from prior study. Conclusion(s)/Recommendation(s): Very difficult study, extremely limited windows. LV function grossly normal. RV remains severely enlarged with severely reduced function. Severe pulmonary hypertension. FINDINGS  Left Ventricle: Left ventricular ejection fraction, by estimation, is 50 to 55%. The left ventricle has low normal function. The left ventricle has no regional wall motion abnormalities. The left ventricular internal cavity size was normal in size. There is no left ventricular hypertrophy. Left ventricular diastolic function could not be evaluated. Right Ventricle: The right ventricular size is severely enlarged. Right vetricular wall thickness was not well visualized. Right ventricular systolic function is severely reduced. There is severely elevated pulmonary artery systolic pressure. The tricuspid regurgitant velocity is 3.88 m/s, and with an assumed right  atrial pressure of 8 mmHg, the estimated right ventricular systolic pressure is 52.8 mmHg. Left Atrium: Left atrial size was not well visualized. Right Atrium: Right atrial size was severely dilated. Pericardium: The pericardium was not well visualized. Mitral Valve: The mitral valve was not well visualized. Trivial mitral valve regurgitation. Tricuspid Valve: The tricuspid valve is normal in structure. Tricuspid valve regurgitation is moderate to severe. Aortic Valve: The aortic valve was not well visualized. Aortic valve regurgitation is not visualized. Pulmonic Valve: The pulmonic valve was not assessed. Pulmonic valve regurgitation is mild. Aorta: The aortic root was not well visualized, the ascending aorta was  not well visualized and the aortic arch was not well visualized. Pulmonary Artery: The pulmonary artery is severely dilated. Venous: The inferior vena cava was not well visualized. IAS/Shunts: The interatrial septum was not well visualized.  LEFT VENTRICLE PLAX 2D LVIDd:         4.90 cm LVIDs:         4.10 cm LV PW:         1.00 cm LV IVS:        1.40 cm  AORTIC VALVE LVOT Vmax:   36.60 cm/s LVOT Vmean:  26.300 cm/s LVOT VTI:    0.064 m TRICUSPID VALVE TR Peak grad:   60.2 mmHg TR Vmax:        388.00 cm/s  SHUNTS Systemic VTI: 0.06 m Buford Dresser MD Electronically signed by Buford Dresser MD Signature Date/Time: 09/26/2020/6:09:53 PM    Final      Labs:   Basic Metabolic Panel: Recent Labs  Lab 10/02/20 1219 10/03/20 0218 10/05/20 0105 10/05/20 0105 10/06/20 0101 10/06/20 0101 10/07/20 0045 10/07/20 0045 10/08/20 0146 10/09/20 0019  NA 142   < > 137  --  138  --  131*  --  135 134*  K 2.2*   < > 2.8*   < > 2.9*   < > 2.8*   < > 3.1* 3.1*  CL 84*   < > 83*  --  84*  --  82*  --  84* 83*  CO2 39*   < > 36*  --  35*  --  34*  --  37* 33*  GLUCOSE 132*   < > 110*  --  181*  --  143*  --  157* 142*  BUN 69*   < > 94*  --  91*  --  87*  --  83* 68*  CREATININE 3.06*   <  > 3.02*  --  2.84*  --  2.79*  --  2.71* 2.44*  CALCIUM 8.1*   < > 8.4*  --  8.7*  --  8.4*  --  8.6* 8.7*  MG 2.0  --   --   --   --   --  2.0  --   --   --    < > = values in this interval not displayed.   GFR Estimated Creatinine Clearance: 33.8 mL/min (A) (by C-G formula based on SCr of 2.44 mg/dL (H)). Liver Function Tests: No results for input(s): AST, ALT, ALKPHOS, BILITOT, PROT, ALBUMIN in the last 168 hours. No results for input(s): LIPASE, AMYLASE in the last 168 hours. No results for input(s): AMMONIA in the last 168 hours. Coagulation profile No results for input(s): INR, PROTIME in the last 168 hours.  CBC: Recent Labs  Lab 10/05/20 0105 10/07/20 0045 10/08/20 0146  WBC 10.3 10.1 9.1  HGB 10.5* 10.2* 9.7*  HCT 33.7* 32.4* 30.6*  MCV 83.6 83.3 82.3  PLT 177 179 194   Cardiac Enzymes: No results for input(s): CKTOTAL, CKMB, CKMBINDEX, TROPONINI in the last 168 hours. BNP: Invalid input(s): POCBNP CBG: Recent Labs  Lab 10/08/20 1121 10/08/20 1756 10/08/20 2114 10/09/20 0615 10/09/20 0845  GLUCAP 120* 228* 167* 142* 163*   D-Dimer No results for input(s): DDIMER in the last 72 hours. Hgb A1c No results for input(s): HGBA1C in the last 72 hours. Lipid Profile No results for input(s): CHOL, HDL, LDLCALC, TRIG, CHOLHDL, LDLDIRECT in the last 72 hours. Thyroid function studies No results for input(s): TSH, T4TOTAL, T3FREE, THYROIDAB  in the last 72 hours.  Invalid input(s): FREET3 Anemia work up No results for input(s): VITAMINB12, FOLATE, FERRITIN, TIBC, IRON, RETICCTPCT in the last 72 hours. Microbiology Recent Results (from the past 240 hour(s))  Gastrointestinal Panel by PCR , Stool     Status: Abnormal   Collection Time: 09/30/20 11:42 AM   Specimen: Stool  Result Value Ref Range Status   Campylobacter species NOT DETECTED NOT DETECTED Final   Plesimonas shigelloides NOT DETECTED NOT DETECTED Final   Salmonella species NOT DETECTED NOT DETECTED  Final   Yersinia enterocolitica NOT DETECTED NOT DETECTED Final   Vibrio species NOT DETECTED NOT DETECTED Final   Vibrio cholerae NOT DETECTED NOT DETECTED Final   Enteroaggregative E coli (EAEC) NOT DETECTED NOT DETECTED Final   Enteropathogenic E coli (EPEC) DETECTED (A) NOT DETECTED Final    Comment: RESULT CALLED TO, READ BACK BY AND VERIFIED WITH: AMINA MOUHAMUD 10/01/20 AT 1220 BY ACR    Enterotoxigenic E coli (ETEC) NOT DETECTED NOT DETECTED Final   Shiga like toxin producing E coli (STEC) NOT DETECTED NOT DETECTED Final   Shigella/Enteroinvasive E coli (EIEC) NOT DETECTED NOT DETECTED Final   Cryptosporidium NOT DETECTED NOT DETECTED Final   Cyclospora cayetanensis NOT DETECTED NOT DETECTED Final   Entamoeba histolytica NOT DETECTED NOT DETECTED Final   Giardia lamblia NOT DETECTED NOT DETECTED Final   Adenovirus F40/41 NOT DETECTED NOT DETECTED Final   Astrovirus NOT DETECTED NOT DETECTED Final   Norovirus GI/GII NOT DETECTED NOT DETECTED Final   Rotavirus A NOT DETECTED NOT DETECTED Final   Sapovirus (I, II, IV, and V) NOT DETECTED NOT DETECTED Final    Comment: Performed at [facility], [address] [place], [place] [address]  Culture, blood (routine x 2)     Status: None (Preliminary result)   Collection Time: 10/05/20  9:00 AM   Specimen: BLOOD LEFT HAND  Result Value Ref Range Status   Specimen Description BLOOD LEFT HAND  Final   Special Requests   Final    BOTTLES DRAWN AEROBIC AND ANAEROBIC Blood Culture results may not be optimal due to an inadequate volume of blood received in culture bottles   Culture   Final    NO GROWTH 4 DAYS Performed at [facility] [address], [place], [place] [address]    Report Status PENDING  Incomplete  Culture, blood (routine x 2)     Status: None (Preliminary result)   Collection Time: 10/05/20  9:10 AM   Specimen: BLOOD  Result Value Ref Range Status   Specimen Description BLOOD LEFT ANTECUBITAL  Final    Special Requests   Final    BOTTLES DRAWN AEROBIC AND ANAEROBIC Blood Culture adequate volume   Culture   Final    NO GROWTH 4 DAYS Performed at [facility], [place] [address] [place], [place] [address]    Report Status PENDING  Incomplete  Resp Panel by RT-PCR (Flu A&B, Covid) Nasopharyngeal Swab     Status: None   Collection Time: 10/08/20  2:05 PM   Specimen: Nasopharyngeal Swab; Nasopharyngeal(NP) swabs in vial transport medium  Result Value Ref Range Status   SARS Coronavirus 2 by RT PCR NEGATIVE NEGATIVE Final    Comment: (NOTE) SARS-CoV-2 target nucleic acids are NOT DETECTED.  The SARS-CoV-2 RNA is generally detectable in upper respiratory specimens during the acute phase of infection. The lowest concentration of SARS-CoV-2 viral copies this assay can detect is 138 copies/mL. A negative  result does not preclude SARS-Cov-2 infection and should not be used as the sole basis for treatment or other patient management decisions. A negative result may occur with  improper specimen collection/handling, submission of specimen other than nasopharyngeal swab, presence of viral mutation(s) within the areas targeted by this assay, and inadequate number of viral copies(<138 copies/mL). A negative result must be combined with clinical observations, patient history, and epidemiological information. The expected result is Negative.  Fact Sheet for Patients:  EntrepreneurPulse.com.au  Fact Sheet for Healthcare Providers:  IncredibleEmployment.be  This test is no t yet approved or cleared by the [place] FDA and  has been authorized for detection and/or diagnosis of SARS-CoV-2 by FDA under an Emergency Use Authorization (EUA). This EUA will remain  in effect (meaning this test can be used) for the duration of the COVID-19 declaration under Section 564(b)(1) of the Act, 21 U.S.C.section 360bbb-3(b)(1), unless the authorization is terminated   or revoked sooner.       Influenza A by PCR NEGATIVE NEGATIVE Final   Influenza B by PCR NEGATIVE NEGATIVE Final    Comment: (NOTE) The Xpert Xpress SARS-CoV-2/FLU/RSV plus assay is intended as an aid in the diagnosis of influenza from Nasopharyngeal swab specimens and should not be used as a sole basis for treatment. Nasal washings and aspirates are unacceptable for Xpert Xpress SARS-CoV-2/FLU/RSV testing.  Fact Sheet for Patients: EntrepreneurPulse.com.au  Fact Sheet for Healthcare Providers: IncredibleEmployment.be  This test is not yet approved or cleared by the [place] FDA and has been authorized for detection and/or diagnosis of SARS-CoV-2 by FDA under an Emergency Use Authorization (EUA). This EUA will remain in effect (meaning this test can be used) for the duration of the COVID-19 declaration under Section 564(b)(1) of the Act, 21 U.S.C. section 360bbb-3(b)(1), unless the authorization is terminated or revoked.  Performed at [facility], [place] [address], [place], [place] [address]      Discharge Instructions:   Discharge Instructions    (HEART FAILURE PATIENTS) Call MD:  Anytime you have any of the following symptoms: 1) 3 pound weight gain in 24 hours or 5 pounds in 1 week 2) shortness of breath, with or without a dry hacking cough 3) swelling in the hands, feet or stomach 4) if you have to sleep on extra pillows at night in order to breathe.   Complete by: As directed    Discharge wound care:   Complete by: As directed    Apply Aquacel Advantage [name] # [phone]) to the sacral wound and cover with foam dressing (placed upside down to prevent stool from getting into the foam dressing). Change daily or PRN soiling. Turn q4h to prevent worsening of wound. Order and place patient on standard air mattress.   Heart Failure patients record your daily weight using the same scale at the same time of day   Complete by: As  directed      Allergies as of 10/09/2020      Reactions   Bidil [isosorb Dinitrate-hydralazine] Shortness Of Breath   Benazepril Nausea And Vomiting   Brimonidine    Other reaction(s): Other (See Comments)   Metformin    Other reaction(s): Cramps (ALLERGY/intolerance), Other (See Comments)   Morphine Rash      Medication List    STOP taking these medications   Cholecalciferol 50 MCG (2000 UT) Tabs   Ensure Max Protein Liqd   FeroSul 325 (65 FE) MG tablet Generic drug: ferrous sulfate   gabapentin 300 MG  capsule Commonly known as: NEURONTIN   insulin aspart protamine - aspart (70-30) 100 UNIT/ML FlexPen Commonly known as: NovoLOG Mix 70/30 FlexPen   metolazone 2.5 MG tablet Commonly known as: ZAROXOLYN   oxybutynin 5 MG tablet Commonly known as: DITROPAN   oxyCODONE-acetaminophen 10-325 MG tablet Commonly known as: PERCOCET   pantoprazole 40 MG tablet Commonly known as: PROTONIX   polyethylene glycol 17 g packet Commonly known as: MIRALAX / GLYCOLAX   predniSONE 10 MG tablet Commonly known as: DELTASONE   rosuvastatin 40 MG tablet Commonly known as: CRESTOR   senna-docusate 8.6-50 MG tablet Commonly known as: Senokot-S   terbinafine 1 % cream Commonly known as: LAMISIL     TAKE these medications   acetaminophen 325 MG tablet Commonly known as: TYLENOL Take 2 tablets (650 mg total) by mouth every 4 (four) hours as needed for headache or mild pain.   acidophilus Caps capsule Take 2 capsules by mouth daily. Start taking on: October 10, 2020   albuterol 108 (90 Base) MCG/ACT inhaler Commonly known as: VENTOLIN HFA Inhale 2 puffs into the lungs every 6 (six) hours as needed for wheezing or shortness of breath.   albuterol (2.5 MG/3ML) 0.083% nebulizer solution Commonly known as: PROVENTIL Take 3 mLs (2.5 mg total) by nebulization every 4 (four) hours as needed for wheezing or shortness of breath.   amiodarone 200 MG tablet Commonly known as:  PACERONE Take 0.5 tablets (100 mg total) by mouth daily.   amoxicillin-clavulanate 875-125 MG tablet Commonly known as: AUGMENTIN Take 1 tablet by mouth daily.   apixaban 5 MG Tabs tablet Commonly known as: ELIQUIS Take 1 tablet (5 mg total) by mouth 2 (two) times daily. What changed: how much to take   ascorbic acid 500 MG tablet Commonly known as: VITAMIN C Take 500 mg by mouth 3 (three) times a week. Monday Wednesday Friday   atorvastatin 40 MG tablet Commonly known as: LIPITOR Take 1 tablet (40 mg total) by mouth daily. Start taking on: October 10, 2020   Breztri Aerosphere 160-9-4.8 MCG/ACT Aero Generic drug: Budeson-Glycopyrrol-Formoterol Inhale 2 puffs into the lungs 2 (two) times daily.   carbamide peroxide 6.5 % OTIC solution Commonly known as: DEBROX Place 5-10 drops into both ears 3 (three) times daily.   Carboxymethylcellulose Sod PF 0.25 % Soln Place 1 drop into both eyes 4 (four) times daily.   cetirizine 10 MG tablet Commonly known as: ZYRTEC Take 10 mg by mouth daily as needed for allergies.   diazepam 2 MG tablet Commonly known as: VALIUM Take 1 tablet (2 mg total) by mouth 2 (two) times daily. What changed:   medication strength  how much to take  when to take this   famotidine 20 MG tablet Commonly known as: PEPCID Take 1 tablet (20 mg total) by mouth 2 (two) times daily.   fluticasone 50 MCG/ACT nasal spray Commonly known as: FLONASE Place 2 sprays into both nostrils daily as needed for allergies.   HYDROcodone-acetaminophen 5-325 MG tablet Commonly known as: NORCO/VICODIN Take 1 tablet by mouth every 6 (six) hours as needed for up to 3 days for moderate pain or severe pain.   hydrocortisone 1 % lotion Apply 1 application topically See admin instructions. Apply small amount to back twice daily for itchy rash as needed   hydrocortisone 2.5 % rectal cream Commonly known as: ANUSOL-HC Place 1 application rectally 2 (two) times daily as  needed for hemorrhoids or itching.   insulin aspart 100 UNIT/ML injection Commonly  known as: novoLOG Inject 0-5 Units into the skin at bedtime. What changed:   how much to take  when to take this   insulin aspart 100 UNIT/ML injection Commonly known as: novoLOG Inject 0-15 Units into the skin 3 (three) times daily with meals. What changed: You were already taking a medication with the same name, and this prescription was added. Make sure you understand how and when to take each.   Insulin Syringe 27G X 1/2" 0.5 ML Misc 100 Syringes by Does not apply route 3 (three) times daily.   latanoprost 0.005 % ophthalmic solution Commonly known as: XALATAN Place 1 drop into both eyes at bedtime as needed (dry eyes).   lidocaine 5 % Commonly known as: LIDODERM Place 1 patch onto the skin daily. Remove & Discard patch within 12 hours or as directed by MD Start taking on: October 10, 2020   OXYGEN Inhale 3 L into the lungs continuous.   potassium chloride SA 20 MEQ tablet Commonly known as: KLOR-CON Take 2 tablets (40 mEq total) by mouth 3 (three) times daily. What changed:   how much to take  when to take this  additional instructions   PRESCRIPTION MEDICATION Cpap   sodium chloride 0.65 % Soln nasal spray Commonly known as: OCEAN Place 2 sprays into both nostrils as needed for congestion.   tamsulosin 0.4 MG Caps capsule Commonly known as: FLOMAX Take 1 capsule (0.4 mg total) by mouth daily. What changed:   when to take this  reasons to take this   torsemide 20 MG tablet Commonly known as: DEMADEX Take 4 tablets (80 mg total) by mouth 2 (two) times daily.   zinc oxide 20 % ointment Apply 1 application topically 2 (two) times daily as needed for irritation (rash).            Discharge Care Instructions  (From admission, onward)         Start     Ordered   10/09/20 0000  Discharge wound care:       Comments: Apply Aquacel Advantage [name] # [phone]) to  the sacral wound and cover with foam dressing (placed upside down to prevent stool from getting into the foam dressing). Change daily or PRN soiling. Turn q4h to prevent worsening of wound. Order and place patient on standard air mattress.   10/09/20 1029          Follow-up Information    [name], MD Follow up.   Specialty: Internal Medicine Contact information: [address] [place] [address] [phone]                Time coordinating discharge: 35 min  Signed:  Geradine Girt DO  [facility] 10/09/2020, 10:31 AM

## 2020-10-09 NOTE — Progress Notes (Signed)
Patient ID: Nicholas Caldwell, male   DOB: December 13, 1945, 74 y.o.   MRN: [id number]  This NP visited patient at the bedside as a follow up for palliative needs and emotional support.  Patient is out of bed to the chair and his wife is at the bedside.  Continued education/conversation regarding current medical situation, discussed the seriousness of the patient's multiple comorbidities and his high risk for decompensation.  We discussed the limitations of medical interventions and the limitations of medical interventions to progress patient beyond his current baseline.  Patient recognizes his continued physical and functional decline however they are open to all offered and available medical interventions to prolong life.  Again education offered regarding the importance of documentation of H POA and advanced care planning.  Encouraged patient and wife to utilize the assistance of the spiritual care department to document this paperwork.  They have declined at this time.  Discussed with patient the importance of continued conversation with his family and the  medical providers regarding overall plan of care and treatment options,  ensuring decisions are within the context of the patients values and GOCs.  Questions and concerns addressed   Discussed with Dr Eliseo Squires I have not looked at [facility] recommend outpatient community-based palliative services on discharge.  Total time spent on the unit was 35 minutes  Greater than 50% of the time was spent in counseling and coordination of care  Wadie Lessen NP  Palliative Medicine Team Team Phone # [phone] Pager [phone]

## 2020-10-09 NOTE — TOC Progression Note (Signed)
Transition of Care [facility]) - Progression Note    Patient Details  Name: Nicholas Caldwell MRN: [id number] Date of Birth: 06/02/1946  Transition of Care [facility]) CM/SW [place], [place] Phone Number: 10/09/2020, 9:31 AM  Clinical Narrative:     Nicholas Caldwell is able to accept pt.   CSW spoke with pt wife and notified her that [facility] unable to accept pt due to no RN wound care staff. She is upset about lack of SNF options and insists her husband go to CIR. CSW explained that pt is not able to tolerate the amount of PT for CIR and that it is not being recommended. Wife is not accepting of CSW's explanation and requests that doctor be involved. CSW notified MD.   CSW called pt wife back after MD spoke with her and continued to explain that CIR is not an option. CSW explains HH vs SNF as options. Wife is finally agreeable to SNF. CSW explains that SNF social worker will arrange [facility]  After he completes rehab at [facility] Plan for [facility] in the afternoon. CSW confirmed with [facility] for d/c in afternoon. Josem Kaufmann has been approved.        Expected Discharge Plan and Services                                                 Social Determinants of Health (SDOH) Interventions    Readmission Risk Interventions Readmission Risk Prevention Plan 08/15/2019 06/13/2019 04/14/2019  Transportation Screening Complete Complete Complete  Medication Review Press photographer) Not Complete Complete Complete  Med Review Comments requested discharge medication reconciliation by attending - -  PCP or Specialist appointment within 3-5 days of discharge Not Complete Complete Complete  PCP/Specialist Appt Not Complete comments [facility] informed of admit and will contact pt directly regarding follow up appt - 6/17 with cardiology  [place] or Home Care Consult Complete Complete Complete  SW Recovery Care/Counseling Consult - Complete Complete  Palliative Care Screening - Not Applicable Not [place] - Not Applicable Not Applicable  Some recent data might be hidden

## 2020-10-09 NOTE — Progress Notes (Signed)
Ptar here for pt ,placed on stretcher left unit .Belongings sent with pt.

## 2020-10-09 NOTE — Progress Notes (Signed)
Report called and given to [employer] Rn,_0 , [place] transfer.Pt had hygienic needs met , rectal tube removed .

## 2020-10-09 NOTE — Progress Notes (Signed)
Call made to [place] for Report placed on hold 20 minutes , will call back.

## 2020-10-09 NOTE — Progress Notes (Signed)
  Speech Language Pathology Treatment: Dysphagia  Patient Details Name: Nicholas Caldwell MRN: [id number] DOB: [birth year] Today's Date: 10/09/2020 Time: 9211-9417 SLP Time Calculation (min) (ACUTE ONLY): 8 min  Assessment / Plan / Recommendation Clinical Impression  Pt seen today for ongoing dysphagia management.  Today pt exhibited delayed, weak cough following initial sip of thin liquid by straw.  Pt endorsed feeling of liquid going down the wrong way.  This was not observed on further trials by straw.  Pt tolerated puree and regular texture solids without overt s/s of aspiration.  Pt was able to bite through regular solid cracker despite edentulism and exhibited good oral clearance; however, prolonged oral phase was noted with regular texture.  Given pt's clinical presentation today, hx of dysphagia with repeat clinical swallow evaluations, and chest imaging concerning for aspiration pneumonia, recommend further assessment of pharyngeal swallow function.  Silent aspiration cannot be ruled out at bedside.    Recommend continuing mechanical soft diet with then liquids pending results of MBSS tentatively scheduled for next date.    HPI HPI: Nicholas Caldwell is a 74 y.o. male with medical history of NISCM, systolic CHF and mod RV failure, DM2, CKD 4, PAF s/p DCCV, COPD on home O2, OHS/ OSA on 3 L O2 and CPAP & CAD.  The patient is currently sleepy and unable to give me a detailed history. He knows he is at [facility] and know his age but will not tell me why he is in the hospital and falls back to sleep. Per ED notes, he has been more short of breath for about 1 wk and has more fluid on his legs.  He was found to be in acute on chronic respiratory and heart failure with encephalopathy.  Most recent chest CT was showing new ill-defined airspace disease in the lateral aspect of the right upper lobe is concerning for pneumonia. Other scattered areas of ground-glass attenuation are likely related. Stable 5.5 cm left upper  lobe ulmonary nodule.  Stable mediastinal and hilar adenopathy.  Bilateral pleural effusions and associated atelectasis.      SLP Plan  MBS       Recommendations  Diet recommendations: Dysphagia 3 (mechanical soft);Thin liquid Liquids provided via: Cup;Straw Medication Administration: Whole meds with liquid Supervision: Staff to assist with self feeding Compensations: Slow rate;Small sips/bites Postural Changes and/or Swallow Maneuvers: Seated upright 90 degrees                Oral Care Recommendations: Oral care BID Follow up Recommendations:  (pending instrumental study) SLP Visit Diagnosis: Dysphagia, unspecified (R13.10) Plan: [place], [place], [place] Office: [phone]  10/09/2020, 10:54 AM

## 2020-10-09 NOTE — TOC Transition Note (Signed)
Transition of Care [facility]) - CM/SW Discharge Note   Patient Details  Name: Nicholas Caldwell MRN: [id number] Date of Birth: 1945-12-29  Transition of Care [facility]) CM/SW Contact:  Vinie Sill, [place] Phone Number: 10/09/2020, 1:49 PM   Clinical Narrative:     Patient will DC AJ:[facility] [place] Date: 10/09/2020 Family Notified:Goodwill,Patricia (Spouse)  [phone] (Home Phone) Transport By: Corey Harold   Per MD patient is ready for discharge.[place], patient, and facility notified of DC. Discharge Summary sent to facility. RN to call report to. [phone]. Ambulance transport requested for patient.   Clinical Social Worker signing off.   Final next level of care:  (SNF [facility])     Patient Goals and CMS Choice        Discharge Placement              Patient chooses bed at: [facility] Patient to be transferred to facility by: [place] Name of family member notified: [place], Kersh (Spouse) [phone] [facility] Phone) Patient and family notified of of transfer: 10/09/20  Discharge Plan and Services                                     Social Determinants of Health (SDOH) Interventions     Readmission Risk Interventions Readmission Risk Prevention Plan 08/15/2019 06/13/2019 04/14/2019  Transportation Screening Complete Complete Complete  Medication Review Press photographer) Not Complete Complete Complete  Med Review Comments requested discharge medication reconciliation by attending - -  PCP or Specialist appointment within 3-5 days of discharge Not Complete Complete Complete  PCP/Specialist Appt Not Complete comments [facility] informed of admit and will contact pt directly regarding follow up appt - 6/17 with cardiology  [place] or Home Care Consult Complete Complete Complete  SW Recovery Care/Counseling Consult - Complete Complete  Palliative Care Screening - Not Applicable Not [place] - Not Applicable Not Applicable   Some recent data might be hidden

## 2020-10-10 LAB — CULTURE, BLOOD (ROUTINE X 2)
Culture: NO GROWTH
Culture: NO GROWTH
Special Requests: ADEQUATE

## 2020-10-11 DIAGNOSIS — J9611 Chronic respiratory failure with hypoxia: Secondary | ICD-10-CM | POA: Diagnosis not present

## 2020-10-11 DIAGNOSIS — I5042 Chronic combined systolic (congestive) and diastolic (congestive) heart failure: Secondary | ICD-10-CM | POA: Diagnosis not present

## 2020-10-11 DIAGNOSIS — J449 Chronic obstructive pulmonary disease, unspecified: Secondary | ICD-10-CM | POA: Diagnosis not present

## 2020-10-11 DIAGNOSIS — L89150 Pressure ulcer of sacral region, unstageable: Secondary | ICD-10-CM | POA: Diagnosis not present

## 2020-10-18 DIAGNOSIS — N186 End stage renal disease: Secondary | ICD-10-CM | POA: Diagnosis not present

## 2020-10-18 DIAGNOSIS — L89150 Pressure ulcer of sacral region, unstageable: Secondary | ICD-10-CM | POA: Diagnosis not present

## 2020-10-18 DIAGNOSIS — L8992 Pressure ulcer of unspecified site, stage 2: Secondary | ICD-10-CM | POA: Diagnosis not present

## 2020-10-18 DIAGNOSIS — Z992 Dependence on renal dialysis: Secondary | ICD-10-CM | POA: Diagnosis not present

## 2020-10-18 DIAGNOSIS — N2581 Secondary hyperparathyroidism of renal origin: Secondary | ICD-10-CM | POA: Diagnosis not present

## 2020-10-22 ENCOUNTER — Telehealth (HOSPITAL_COMMUNITY): Payer: Self-pay | Admitting: Cardiology

## 2020-10-22 DIAGNOSIS — I5022 Chronic systolic (congestive) heart failure: Secondary | ICD-10-CM

## 2020-10-22 NOTE — Telephone Encounter (Signed)
Pt wife stated pt need a [place] order, physical therapy, and someone to help with medications, from DM, pt can be reached _[phone]. Thanks

## 2020-10-22 NOTE — Telephone Encounter (Signed)
Pts wife called to request restarting [place] services including para medicine   Orders placed for [facility] and para medicine

## 2020-10-23 ENCOUNTER — Telehealth (HOSPITAL_COMMUNITY): Payer: Self-pay | Admitting: Licensed Clinical Social Worker

## 2020-10-23 ENCOUNTER — Other Ambulatory Visit: Payer: Self-pay

## 2020-10-23 NOTE — Patient Outreach (Addendum)
[facility]) Care Management  10/23/2020  Nicholas Caldwell 1946-10-13 [id number]   Referral Date: 10/23/20 Referral Source: [employer] Report Date of Discharge: 10/21/20 Facility: [facility] attempt: Spoke with spouse she reports that patient is doing ok since being at home. She reports home health nurse to come out and that the paramedicine program is to come out to work with them and keep patient medications straight. She states Dr. Marjo Bicker at the [facility] is his primary doctor and that she will be scheduling appointment.  She declined needing CM assistance to set up appointments. Discussed patient condition and need for help and support of [facility].  She declined right now   Consent: RN CM reiterated [facility] services with spouse again.  She declined again as she states she has different people coming to home to care and support at this time.  She is agreeable to receive letter and brochure.  Plan: RN CM will close case.     Jone Baseman, RN, MSN [facility] Care Management Care Management Coordinator Direct Line [phone] Toll Free: [phone]  Fax: [phone]

## 2020-10-23 NOTE — Telephone Encounter (Signed)
CSW consulted to add pt back to paramedicine after recent hospital and SNF stay.  Pt referral sent out to paramedics.  Will continue to follow and assist as needed  Nicholas Caldwell, [facility] Desk#: [phone] Cell#: [phone]

## 2020-10-27 DIAGNOSIS — R269 Unspecified abnormalities of gait and mobility: Secondary | ICD-10-CM | POA: Diagnosis not present

## 2020-10-27 DIAGNOSIS — J449 Chronic obstructive pulmonary disease, unspecified: Secondary | ICD-10-CM | POA: Diagnosis not present

## 2020-10-27 DIAGNOSIS — J9621 Acute and chronic respiratory failure with hypoxia: Secondary | ICD-10-CM | POA: Diagnosis not present

## 2020-10-27 DIAGNOSIS — J441 Chronic obstructive pulmonary disease with (acute) exacerbation: Secondary | ICD-10-CM | POA: Diagnosis not present

## 2020-10-29 ENCOUNTER — Emergency Department (HOSPITAL_COMMUNITY): Payer: No Typology Code available for payment source

## 2020-10-29 ENCOUNTER — Other Ambulatory Visit (HOSPITAL_COMMUNITY): Payer: Self-pay | Admitting: Cardiology

## 2020-10-29 ENCOUNTER — Other Ambulatory Visit (HOSPITAL_COMMUNITY): Payer: Self-pay

## 2020-10-29 ENCOUNTER — Inpatient Hospital Stay (HOSPITAL_COMMUNITY)
Admission: EM | Admit: 2020-10-29 | Discharge: 2020-11-01 | DRG: 640 | Disposition: A | Discharge status: Home Health | Payer: No Typology Code available for payment source | Attending: Internal Medicine | Admitting: Internal Medicine

## 2020-10-29 DIAGNOSIS — J9621 Acute and chronic respiratory failure with hypoxia: Secondary | ICD-10-CM | POA: Diagnosis not present

## 2020-10-29 DIAGNOSIS — I5033 Acute on chronic diastolic (congestive) heart failure: Secondary | ICD-10-CM | POA: Diagnosis not present

## 2020-10-29 DIAGNOSIS — N184 Chronic kidney disease, stage 4 (severe): Secondary | ICD-10-CM | POA: Diagnosis present

## 2020-10-29 DIAGNOSIS — M549 Dorsalgia, unspecified: Secondary | ICD-10-CM | POA: Diagnosis present

## 2020-10-29 DIAGNOSIS — I2510 Atherosclerotic heart disease of native coronary artery without angina pectoris: Secondary | ICD-10-CM | POA: Diagnosis present

## 2020-10-29 DIAGNOSIS — E7800 Pure hypercholesterolemia, unspecified: Secondary | ICD-10-CM | POA: Diagnosis present

## 2020-10-29 DIAGNOSIS — Z8249 Family history of ischemic heart disease and other diseases of the circulatory system: Secondary | ICD-10-CM | POA: Diagnosis not present

## 2020-10-29 DIAGNOSIS — Z20822 Contact with and (suspected) exposure to covid-19: Secondary | ICD-10-CM | POA: Diagnosis present

## 2020-10-29 DIAGNOSIS — E662 Morbid (severe) obesity with alveolar hypoventilation: Secondary | ICD-10-CM | POA: Diagnosis present

## 2020-10-29 DIAGNOSIS — Z66 Do not resuscitate: Secondary | ICD-10-CM | POA: Diagnosis present

## 2020-10-29 DIAGNOSIS — J449 Chronic obstructive pulmonary disease, unspecified: Secondary | ICD-10-CM | POA: Diagnosis present

## 2020-10-29 DIAGNOSIS — Z794 Long term (current) use of insulin: Secondary | ICD-10-CM

## 2020-10-29 DIAGNOSIS — Z87891 Personal history of nicotine dependence: Secondary | ICD-10-CM | POA: Diagnosis not present

## 2020-10-29 DIAGNOSIS — L89159 Pressure ulcer of sacral region, unspecified stage: Secondary | ICD-10-CM | POA: Diagnosis present

## 2020-10-29 DIAGNOSIS — R627 Adult failure to thrive: Secondary | ICD-10-CM | POA: Diagnosis present

## 2020-10-29 DIAGNOSIS — Z9081 Acquired absence of spleen: Secondary | ICD-10-CM | POA: Diagnosis not present

## 2020-10-29 DIAGNOSIS — I482 Chronic atrial fibrillation, unspecified: Secondary | ICD-10-CM | POA: Diagnosis not present

## 2020-10-29 DIAGNOSIS — Z885 Allergy status to narcotic agent status: Secondary | ICD-10-CM

## 2020-10-29 DIAGNOSIS — G8929 Other chronic pain: Secondary | ICD-10-CM | POA: Diagnosis present

## 2020-10-29 DIAGNOSIS — I5020 Unspecified systolic (congestive) heart failure: Secondary | ICD-10-CM | POA: Diagnosis not present

## 2020-10-29 DIAGNOSIS — I10 Essential (primary) hypertension: Secondary | ICD-10-CM | POA: Diagnosis present

## 2020-10-29 DIAGNOSIS — E1169 Type 2 diabetes mellitus with other specified complication: Secondary | ICD-10-CM | POA: Diagnosis present

## 2020-10-29 DIAGNOSIS — Z6831 Body mass index (BMI) 31.0-31.9, adult: Secondary | ICD-10-CM

## 2020-10-29 DIAGNOSIS — Z888 Allergy status to other drugs, medicaments and biological substances status: Secondary | ICD-10-CM | POA: Diagnosis not present

## 2020-10-29 DIAGNOSIS — J9611 Chronic respiratory failure with hypoxia: Secondary | ICD-10-CM | POA: Diagnosis not present

## 2020-10-29 DIAGNOSIS — I4892 Unspecified atrial flutter: Secondary | ICD-10-CM | POA: Diagnosis present

## 2020-10-29 DIAGNOSIS — E785 Hyperlipidemia, unspecified: Secondary | ICD-10-CM | POA: Diagnosis present

## 2020-10-29 DIAGNOSIS — E669 Obesity, unspecified: Secondary | ICD-10-CM

## 2020-10-29 DIAGNOSIS — E875 Hyperkalemia: Principal | ICD-10-CM | POA: Diagnosis present

## 2020-10-29 DIAGNOSIS — E876 Hypokalemia: Secondary | ICD-10-CM | POA: Diagnosis present

## 2020-10-29 DIAGNOSIS — I480 Paroxysmal atrial fibrillation: Secondary | ICD-10-CM | POA: Diagnosis present

## 2020-10-29 DIAGNOSIS — Z7901 Long term (current) use of anticoagulants: Secondary | ICD-10-CM | POA: Diagnosis not present

## 2020-10-29 DIAGNOSIS — E1122 Type 2 diabetes mellitus with diabetic chronic kidney disease: Secondary | ICD-10-CM | POA: Diagnosis present

## 2020-10-29 DIAGNOSIS — I2723 Pulmonary hypertension due to lung diseases and hypoxia: Secondary | ICD-10-CM | POA: Diagnosis present

## 2020-10-29 DIAGNOSIS — Z7401 Bed confinement status: Secondary | ICD-10-CM

## 2020-10-29 DIAGNOSIS — I130 Hypertensive heart and chronic kidney disease with heart failure and stage 1 through stage 4 chronic kidney disease, or unspecified chronic kidney disease: Secondary | ICD-10-CM | POA: Diagnosis not present

## 2020-10-29 DIAGNOSIS — N289 Disorder of kidney and ureter, unspecified: Secondary | ICD-10-CM | POA: Diagnosis present

## 2020-10-29 DIAGNOSIS — Z79899 Other long term (current) drug therapy: Secondary | ICD-10-CM

## 2020-10-29 DIAGNOSIS — Z9981 Dependence on supplemental oxygen: Secondary | ICD-10-CM

## 2020-10-29 DIAGNOSIS — I509 Heart failure, unspecified: Secondary | ICD-10-CM

## 2020-10-29 DIAGNOSIS — E782 Mixed hyperlipidemia: Secondary | ICD-10-CM | POA: Diagnosis present

## 2020-10-29 LAB — CBC
HCT: 34.8 % — ABNORMAL LOW (ref 39.0–52.0)
Hemoglobin: 10.2 g/dL — ABNORMAL LOW (ref 13.0–17.0)
MCH: 26.6 pg (ref 26.0–34.0)
MCHC: 29.3 g/dL — ABNORMAL LOW (ref 30.0–36.0)
MCV: 90.6 fL (ref 80.0–100.0)
Platelets: 381 10*3/uL (ref 150–400)
RBC: 3.84 MIL/uL — ABNORMAL LOW (ref 4.22–5.81)
RDW: 21.2 % — ABNORMAL HIGH (ref 11.5–15.5)
WBC: 7.5 10*3/uL (ref 4.0–10.5)
nRBC: 0.7 % — ABNORMAL HIGH (ref 0.0–0.2)

## 2020-10-29 LAB — RESP PANEL BY RT-PCR (FLU A&B, COVID) ARPGX2
Influenza A by PCR: NEGATIVE
Influenza B by PCR: NEGATIVE
SARS Coronavirus 2 by RT PCR: NEGATIVE

## 2020-10-29 LAB — BASIC METABOLIC PANEL
Anion gap: 8 (ref 5–15)
BUN: 30 mg/dL — ABNORMAL HIGH (ref 8–23)
CO2: 18 mmol/L — ABNORMAL LOW (ref 22–32)
Calcium: 8.9 mg/dL (ref 8.9–10.3)
Chloride: 105 mmol/L (ref 98–111)
Creatinine, Ser: 2.34 mg/dL — ABNORMAL HIGH (ref 0.61–1.24)
GFR, Estimated: 29 mL/min — ABNORMAL LOW (ref >60)
Glucose, Bld: 187 mg/dL — ABNORMAL HIGH (ref 70–99)
Potassium: >7.5 mmol/L (ref 3.5–5.1)
Sodium: 131 mmol/L — ABNORMAL LOW (ref 135–145)

## 2020-10-29 LAB — BRAIN NATRIURETIC PEPTIDE: B Natriuretic Peptide: 2913.5 pg/mL — ABNORMAL HIGH (ref 0.0–100.0)

## 2020-10-29 MED ORDER — DEXTROSE 50 % IV SOLN
1.0000 | Freq: Once | INTRAVENOUS | Status: AC
  Administered 2020-10-30: 50 mL via INTRAVENOUS
  Filled 2020-10-29: qty 50

## 2020-10-29 MED ORDER — FUROSEMIDE 10 MG/ML IJ SOLN
40.0000 mg | INTRAMUSCULAR | Status: AC
  Administered 2020-10-29: 23:00:00 40 mg via INTRAVENOUS
  Filled 2020-10-29: qty 4

## 2020-10-29 MED ORDER — INSULIN ASPART 100 UNIT/ML ~~LOC~~ SOLN
5.0000 [IU] | Freq: Once | SUBCUTANEOUS | Status: AC
  Administered 2020-10-30: 5 [IU] via INTRAVENOUS

## 2020-10-29 MED ORDER — CALCIUM GLUCONATE 10 % IV SOLN
1.0000 g | Freq: Once | INTRAVENOUS | Status: AC
  Administered 2020-10-30: 1 g via INTRAVENOUS
  Filled 2020-10-29: qty 10

## 2020-10-29 NOTE — ED Notes (Signed)
At baseline, pt on 3.5-4L home oxygen. Attempted to place pt on 5L [place], pt reports feeling like he is suffocating and had increased work of breathing. Pt placed back on NRB at 15L. Admits to skipping dose of diuretic today

## 2020-10-29 NOTE — ED Notes (Signed)
Pa notified of critical potassium 7.5

## 2020-10-29 NOTE — Progress Notes (Signed)
Paramedicine Encounter    Patient ID: Nicholas Caldwell, male    DOB: 1946/07/25, 74 y.o.   MRN: [id number]   Patient Care Team: [name], MD as PCP - General (Internal Medicine) Jettie Booze, MD as Consulting Physician (Cardiology) Sharmon Revere as Physician Assistant (Cardiology) [name], MD as Attending Physician (Internal Medicine) Thompson Grayer, MD as Consulting Physician (Clinical Cardiac Electrophysiology) [name], MD (Internal Medicine)  Patient Active Problem List   Diagnosis Date Noted  . Stage III pressure ulcer of sacral region ([place]) 10/01/2020  . Acute systolic CHF (congestive heart failure) ([place]) 09/25/2020  . Acute respiratory failure with hypercapnia ([place])   . Encounter for intubation   . Pressure injury of skin 08/21/2019  . Cardiac arrest ([place]) 08/20/2019  . Palliative care by specialist   . DNR (do not resuscitate) discussion   . Fatigue   . Acute CHF (congestive heart failure) ([place]) 07/07/2019  . Acute on chronic heart failure ([place]) 06/09/2019  . Lobar pneumonia ([place]) 04/14/2019  . Hyperkalemia 04/10/2019  . Cerebral embolism with cerebral infarction 03/21/2019  . Gastritis and gastroduodenitis   . NSVT (nonsustained ventricular tachycardia) ([place]) 03/08/2019  . Tobacco abuse counseling 03/08/2019  . Iron deficiency anemia   . Acute on chronic systolic CHF (congestive heart failure) ([place]) 03/06/2019  . Diabetes mellitus type 2 in obese ([place])   . Primary osteoarthritis of right hip   . Hypomagnesemia   . Steroid-induced hyperglycemia   . Supplemental oxygen dependent   . Leukocytosis   . Hypokalemia   . Acute on chronic anemia   . Diabetic peripheral neuropathy ([place])   . Acute on chronic systolic (congestive) heart failure ([place])   . Acute on chronic respiratory failure ([place]) 01/14/2019  . Hypoxia   . Altered mental status   . RVF (right ventricular failure) ([place]) 12/30/2018  . AKI (acute kidney injury) ([place])   . Acute  respiratory failure ([place]) 11/22/2018  . Acute on chronic respiratory failure with hypoxia and hypercapnia ([place]) 11/13/2018  . Metabolic encephalopathy 19/14/7829  . Acute respiratory failure with hypoxia and hypercapnia ([place]) 07/26/2018  . Acute exacerbation of CHF (congestive heart failure) ([place]) 07/26/2018  . CHF exacerbation ([place]) 05/01/2018  . CHF (congestive heart failure) ([place]) 04/30/2018  . Chronic respiratory failure with hypoxia ([place]) 12/28/2017  . Acute encephalopathy   . Atrial fibrillation with RVR ([place])   . SVT (supraventricular tachycardia) ([place])   . COPD GOLD 0   . Medically noncompliant   . Panlobular emphysema ([place])   . OSA (obstructive sleep apnea)   . Pulmonary hypertension ([place])   . Disorientation   . Pressure injury of skin 09/07/2016  . Acute on chronic combined systolic and diastolic CHF (congestive heart failure) ([place]) 09/05/2016  . Skin lesion-left heal 09/05/2016  . Chronic systolic CHF (congestive heart failure) ([place]) 07/16/2016  . COPD (chronic obstructive pulmonary disease) ([place]) 07/16/2016  . Uncontrolled type 2 diabetes mellitus with complication ([place])   . Diabetic polyneuropathy associated with diabetes mellitus due to underlying condition ([place])   . Normocytic anemia 06/29/2016  . CAD - Non-obstructive by LHC1/16 01/21/2016  . Prolonged QT interval 10/09/2015  . Nonischemic cardiomyopathy ([place]) 10/09/2015  . PAF (paroxysmal atrial fibrillation) ([place])   . Chronic pain 02/19/2015  . Morbid obesity ([place]) 02/13/2015  . Dyspnea   . Elevated troponin I level 11/01/2014  . COPD exacerbation ([place]) 11/01/2014  . Essential hypertension 10/31/2014  . Type 2 diabetes mellitus with neuropathy 10/31/2014  .  Hyperlipidemia  10/31/2014  . Cigarette smoker 10/31/2014    Current Outpatient Medications:  .  acetaminophen (TYLENOL) 325 MG tablet, Take 2 tablets (650 mg total) by mouth every 4 (four) hours as needed for headache or mild pain., Disp: , Rfl:  .   albuterol (PROVENTIL) (2.5 MG/3ML) 0.083% nebulizer solution, Take 3 mLs (2.5 mg total) by nebulization every 4 (four) hours as needed for wheezing or shortness of breath., Disp: 75 mL, Rfl: 12 .  albuterol (VENTOLIN HFA) 108 (90 Base) MCG/ACT inhaler, Inhale 2 puffs into the lungs every 6 (six) hours as needed for wheezing or shortness of breath., Disp: , Rfl:  .  amiodarone (PACERONE) 200 MG tablet, Take 0.5 tablets (100 mg total) by mouth daily., Disp: 15 tablet, Rfl: 3 .  amoxicillin (AMOXIL) 500 MG capsule, Take 500 mg by mouth 3 (three) times daily., Disp: , Rfl:  .  apixaban (ELIQUIS) 5 MG TABS tablet, Take 1 tablet (5 mg total) by mouth 2 (two) times daily. (Patient taking differently: Take 2.5 mg by mouth 2 (two) times daily.), Disp: 60 tablet, Rfl: 1 .  ascorbic acid (VITAMIN C) 500 MG tablet, Take 500 mg by mouth 3 (three) times a week. Monday Wednesday Friday, Disp: , Rfl:  .  Budeson-Glycopyrrol-Formoterol (BREZTRI AEROSPHERE) 160-9-4.8 MCG/ACT AERO, Inhale 2 puffs into the lungs 2 (two) times daily., Disp: 10.7 g, Rfl: 11 .  carbamide peroxide (DEBROX) 6.5 % OTIC solution, Place 5-10 drops into both ears 3 (three) times daily., Disp: , Rfl:  .  Carboxymethylcellulose Sod PF 0.25 % SOLN, Place 1 drop into both eyes 4 (four) times daily., Disp: , Rfl:  .  cetirizine (ZYRTEC) 10 MG tablet, Take 10 mg by mouth daily as needed for allergies. , Disp: , Rfl:  .  diazepam (VALIUM) 2 MG tablet, Take 1 tablet (2 mg total) by mouth 2 (two) times daily., Disp: 2 tablet, Rfl: 0 .  fluticasone (FLONASE) 50 MCG/ACT nasal spray, Place 2 sprays into both nostrils daily as needed for allergies. , Disp: , Rfl:  .  hydrocortisone (ANUSOL-HC) 2.5 % rectal cream, Place 1 application rectally 2 (two) times daily as needed for hemorrhoids or itching. , Disp: , Rfl:  .  hydrocortisone 1 % lotion, Apply 1 application topically See admin instructions. Apply small amount to back twice daily for itchy rash as needed,  Disp: , Rfl:  .  insulin aspart (NOVOLOG) 100 UNIT/ML injection, Inject 0-5 Units into the skin at bedtime., Disp: 10 mL, Rfl: 11 .  insulin aspart (NOVOLOG) 100 UNIT/ML injection, Inject 0-15 Units into the skin 3 (three) times daily with meals., Disp: 10 mL, Rfl: 11 .  Insulin Syringe 27G X 1/2" 0.5 ML MISC, 100 Syringes by Does not apply route 3 (three) times daily., Disp: 100 each, Rfl: 1 .  latanoprost (XALATAN) 0.005 % ophthalmic solution, Place 1 drop into both eyes at bedtime as needed (dry eyes). , Disp: , Rfl:  .  lidocaine (LIDODERM) 5 %, Place 1 patch onto the skin daily. Remove & Discard patch within 12 hours or as directed by MD, Disp: 30 patch, Rfl: 0 .  OXYGEN, Inhale 3 L into the lungs continuous. , Disp: , Rfl:  .  potassium chloride SA (KLOR-CON) 20 MEQ tablet, Take 2 tablets (40 mEq total) by mouth 3 (three) times daily., Disp: , Rfl:  .  PRESCRIPTION MEDICATION, Cpap, Disp: , Rfl:  .  sodium chloride (OCEAN) 0.65 % SOLN nasal spray, Place 2 sprays  into both nostrils as needed for congestion. , Disp: , Rfl:  .  torsemide (DEMADEX) 20 MG tablet, Take 4 tablets (80 mg total) by mouth 2 (two) times daily., Disp: 150 tablet, Rfl: 3 .  zinc oxide 20 % ointment, Apply 1 application topically 2 (two) times daily as needed for irritation (rash). , Disp: , Rfl:  .  acidophilus (RISAQUAD) CAPS capsule, Take 2 capsules by mouth daily. (Patient not taking: Reported on 10/29/2020), Disp: , Rfl:  .  amoxicillin-clavulanate (AUGMENTIN) 875-125 MG tablet, Take 1 tablet by mouth daily. (Patient not taking: Reported on 10/29/2020), Disp: , Rfl:  .  atorvastatin (LIPITOR) 40 MG tablet, Take 1 tablet (40 mg total) by mouth daily. (Patient not taking: Reported on 10/29/2020), Disp: , Rfl:  .  famotidine (PEPCID) 20 MG tablet, TAKE 1 TABLET(20 MG) BY MOUTH TWICE DAILY, Disp: 60 tablet, Rfl: 2 .  tamsulosin (FLOMAX) 0.4 MG CAPS capsule, Take 1 capsule (0.4 mg total) by mouth daily. (Patient not taking:  Reported on 10/29/2020), Disp: 30 capsule, Rfl:  Allergies  Allergen Reactions  . Bidil [Isosorb Dinitrate-Hydralazine] Shortness Of Breath  . Benazepril Nausea And Vomiting  . Brimonidine     Other reaction(s): Other (See Comments)  . Metformin     Other reaction(s): Cramps (ALLERGY/intolerance), Other (See Comments)  . Morphine Rash      Social History   Socioeconomic History  . Marital status: Married    Spouse name: Not on file  . Number of children: Not on file  . Years of education: Not on file  . Highest education level: Not on file  Occupational History  . Not on file  Tobacco Use  . Smoking status: Former Smoker    Packs/day: 1.00    Years: 34.00    Pack years: 34.00    Types: Cigarettes    Quit date: 11/30/2018    Years since quitting: 1.9  . Smokeless tobacco: Never Used  Vaping Use  . Vaping Use: Never used  Substance and Sexual Activity  . Alcohol use: No    Alcohol/week: 0.0 standard drinks  . Drug use: No  . Sexual activity: Not on file  Other Topics Concern  . Not on file  Social History Narrative   ** Merged History Encounter **       Social Determinants of Health   Financial Resource Strain: Not on file  Food Insecurity: Not on file  Transportation Needs: Not on file  Physical Activity: Not on file  Stress: Not on file  Social Connections: Not on file  Intimate Partner Violence: Not on file    Physical Exam Eyes:     Pupils: Pupils are equal, round, and reactive to light.  Cardiovascular:     Rate and Rhythm: Normal rate.  Pulmonary:     Effort: No respiratory distress.     Breath sounds: No stridor. No wheezing, rhonchi or rales.  Musculoskeletal:     Right lower leg: No edema.     Left lower leg: No edema.  Skin:    General: Skin is warm and dry.  Neurological:     General: No focal deficit present.     Mental Status: He is alert and oriented to person, place, and time.  Psychiatric:        Mood and Affect: Mood normal.          Future Appointments  Date Time Provider [place]  11/05/2020  2:00 PM Tanda Rockers, MD [facility] None  BP 102/60   Pulse 72   Temp 98.4 F (36.9 C)   Resp 20   SpO2 98%   Weight yesterday-?? Last visit weight-253 d/c weight   Pt was recently in the hosp with 40lbs of fluid on him and then ended up in SNF post d/c. He is now home. Wife had called clinic to get [facility] services started again and also requested paramedicine back in the home.  He did not like the SNF so he had called his wife to come get him and take him back home.  Wife has not heard from [facility] agency as of yet. Looks like the referral was just sent on 12/20.  He is still weak. He reports not being able to get up and move around. He is using urinal and bedside commode.  It sounds like prior the recent admission he did not contact providers when he noticed his weight increasing and it just continued to climb. He reports med compliance so he isnt sure why his weight had started to increase. He has not been able to weigh again since he has been home.  He has no edema today. Have been home 1 week.  Pt denies dizziness, no c/p. His breathing is doing ok.  He does not have f/u with clinic scheduled yet.  Wife was needing help managing his meds as she cant make out the list that the SNF sent home.  Pt had been taking meds that was left in pill box prior to his last admission.  meds reviewed. Filled pill tray per d/c instructions on epic list.  He is still taking the antibiotic.  Does not have the acidophilus.  There was a lot of med changes-- He does not have the atorvastatin. He was d/c from the rosuvastatin.  Does not have the atorvastatin,  famotidine or tamsulosin.  Pharmacy will get the famotidine and tamsulosin ready but they do not have the atorvastatin on file so I asked clinic to see if they can send that in. I advised wife those 3 meds are not in the pill box, she can place them in there.  Wife  will call PCP to f/u visit.  He has enough meds untinl 1/9 AM dose.    Marylouise Stacks, [facility] Paramedic  10/29/20

## 2020-10-29 NOTE — ED Provider Notes (Signed)
[facility] EMERGENCY DEPARTMENT Provider Note   CSN: [id number] Arrival date & time: 10/29/20  2233     History Chief Complaint  Patient presents with  . Shortness of Breath    Nicholas Caldwell is a 74 y.o. male.  The history is provided by the patient and medical records.  Shortness of Breath   73 y.o. M with hx of atrial flutter, CAD, CHF with EF 50-55%, HTN, NICM, PAF on eliquis, pulmonary HTN, presenting to the ED with SOB.  States he felt a little SOB yesterday but got acutely worse today while laying in bed.  States he is generally bed bound since last hospitalzation earlier in the month (was discharged to SNF but did not like it so asked wife to take him home).  Symptoms worse with lying flat, states he feels like he is suffocating but does improve when sitting upright.  Fire arrived first on scene with O2 sats in the low 80's on RA.  He is generally not oxygen dependent.  He does take demedex but missed his dose today.  He has not checked his weight since he has been back home.  He denies fever, chills.  Does feel he has been coughing a bit, mildly productive with phlegm.  Denies fever, sick contacts, body aches, sore throat, nasal congestion, etc.  He is fully vaccinated with booster for covid-19.   Past Medical History:  Diagnosis Date  . Atrial flutter ([place])    a. recurrent AFlutter with RVR;  b. Amiodarone Rx started 4/16  . CAD (coronary artery disease)    a. LHC 1/16:  mLAD diffuse disease, pLCx mild disease, dLCx with disease but too small for PCI, RCA ok, EF 25-30%  . Chronic pain   . Chronic systolic CHF (congestive heart failure) ([place])   . COPD (chronic obstructive pulmonary disease) ([place])   . Diabetes mellitus without complication ([place])   . Ex-smoker 11/2017  . Hypercholesteremia   . Hypertension   . NICM (nonischemic cardiomyopathy) ([place])    a.dx 2016. b. 2D echo 06/2016 - Last echo 07/01/16: mod dilated LV, mod LVH, EF 25-30%, mild-mod MR, sev  LAE, mild-mod reduced RV systolic function, mild-mod TR, PASP 51mHG.  .Marland KitchenPAF (paroxysmal atrial fibrillation) (HCC)    On amio - ot a candidate for flecainide due to cardiomyopathy, not a candidate for Tikosyn due to prolonged QT, and felt to be a poor candidate for ablation given left atrial size.  . Pulmonary hypertension ([place]   . Tobacco abuse     Patient Active Problem List   Diagnosis Date Noted  . Stage III pressure ulcer of sacral region ([place] 10/01/2020  . Acute systolic CHF (congestive heart failure) ([place]lk[place] 09/25/2020  . Acute respiratory failure with hypercapnia (HC[place]   . Encounter for intubation   . Pressure injury of skin 08/21/2019  . Cardiac arrest ([place] 08/20/2019  . Palliative care by specialist   . DNR (do not resuscitate) discussion   . Fatigue   . Acute CHF (congestive heart failure) ([place] 07/07/2019  . Acute on chronic heart failure (HO[place] 06/09/2019  . Lobar pneumonia ([place] 04/14/2019  . Hyperkalemia 04/10/2019  . Cerebral embolism with cerebral infarction 03/21/2019  . Gastritis and gastroduodenitis   . NSVT (nonsustained ventricular tachycardia) (H[place] 03/08/2019  . Tobacco abuse counseling 03/08/2019  . Iron deficiency anemia   . Acute on chronic systolic CHF (congestive heart failure) ([place]ienna [place] 03/06/2019  . Diabetes mellitus type 2 in obese (HMidd[place]   .  Primary osteoarthritis of right hip   . Hypomagnesemia   . Steroid-induced hyperglycemia   . Supplemental oxygen dependent   . Leukocytosis   . Hypokalemia   . Acute on chronic anemia   . Diabetic peripheral neuropathy ([place])   . Acute on chronic systolic (congestive) heart failure ([place])   . Acute on chronic respiratory failure ([place]) 01/14/2019  . Hypoxia   . Altered mental status   . RVF (right ventricular failure) ([place]) 12/30/2018  . AKI (acute kidney injury) ([place])   . Acute respiratory failure ([place]) 11/22/2018  . Acute on chronic respiratory failure with hypoxia and hypercapnia ([place]) 11/13/2018  .  Metabolic encephalopathy 32/35/5732  . Acute respiratory failure with hypoxia and hypercapnia ([place]) 07/26/2018  . Acute exacerbation of CHF (congestive heart failure) ([place]) 07/26/2018  . CHF exacerbation ([place]) 05/01/2018  . CHF (congestive heart failure) ([place]) 04/30/2018  . Chronic respiratory failure with hypoxia ([place]) 12/28/2017  . Acute encephalopathy   . Atrial fibrillation with RVR ([place])   . SVT (supraventricular tachycardia) ([place])   . COPD GOLD 0   . Medically noncompliant   . Panlobular emphysema ([place])   . OSA (obstructive sleep apnea)   . Pulmonary hypertension ([place])   . Disorientation   . Pressure injury of skin 09/07/2016  . Acute on chronic combined systolic and diastolic CHF (congestive heart failure) ([place]) 09/05/2016  . Skin lesion-left heal 09/05/2016  . Chronic systolic CHF (congestive heart failure) ([place]) 07/16/2016  . COPD (chronic obstructive pulmonary disease) ([place]) 07/16/2016  . Uncontrolled type 2 diabetes mellitus with complication ([place])   . Diabetic polyneuropathy associated with diabetes mellitus due to underlying condition ([place])   . Normocytic anemia 06/29/2016  . CAD - Non-obstructive by LHC1/16 01/21/2016  . Prolonged QT interval 10/09/2015  . Nonischemic cardiomyopathy ([place]) 10/09/2015  . PAF (paroxysmal atrial fibrillation) ([place])   . Chronic pain 02/19/2015  . Morbid obesity ([place]) 02/13/2015  . Dyspnea   . Elevated troponin I level 11/01/2014  . COPD exacerbation ([place]) 11/01/2014  . Essential hypertension 10/31/2014  . Type 2 diabetes mellitus with neuropathy 10/31/2014  . Hyperlipidemia  10/31/2014  . Cigarette smoker 10/31/2014    Past Surgical History:  Procedure Laterality Date  . BIOPSY  03/11/2019   Procedure: BIOPSY;  Surgeon: Lavena Bullion, DO;  Location: [place];  Service: Gastroenterology;;  . BIOPSY  03/15/2019   Procedure: BIOPSY;  Surgeon: Irving Copas., MD;  Location: [facility];  Service: Gastroenterology;;  .  CARDIOVERSION N/A 07/30/2018   Procedure: CARDIOVERSION;  Surgeon: Larey Dresser, MD;  Location[facility]COPY;  Service: Cardiovascular;  Laterality: N/A;  . COLONOSCOPY N/A 03/11/2019   Procedure: COLONOSCOPY;  Surgeon: Lavena Bullion, DO;  Location: [place];  Service: Gastroenterology;  Laterality: N/A;  . ENTEROSCOPY N/A 03/15/2019   Procedure: ENTEROSCOPY;  Surgeon: Rush Landmark Telford Nab., MD;  Location: [place];  Service: Gastroenterology;  Laterality: N/A;  . ESOPHAGOGASTRODUODENOSCOPY Left 03/11/2019   Procedure: ESOPHAGOGASTRODUODENOSCOPY (EGD);  Surgeon: Lavena Bullion, DO;  Location: [facility] ENDOSCOPY;  Service: Gastroenterology;  Laterality: Left;  . LEFT AND RIGHT HEART CATHETERIZATION WITH CORONARY ANGIOGRAM N/A 11/06/2014   Procedure: LEFT AND RIGHT HEART CATHETERIZATION WITH CORONARY ANGIOGRAM;  Surgeon: Jettie Booze, MD;  Location: [facility] CATH LAB;  Service: Cardiovascular;  Laterality: N/A;  . RIGHT/LEFT HEART CATH AND CORONARY ANGIOGRAPHY N/A 12/06/2018   Procedure: RIGHT/LEFT HEART CATH AND CORONARY ANGIOGRAPHY;  Surgeon: Larey Dresser, MD;  Location: [place] CV LAB;  Service:  Cardiovascular;  Laterality: N/A;  . SPLENECTOMY    . SUBMUCOSAL TATTOO INJECTION  03/15/2019   Procedure: SUBMUCOSAL TATTOO INJECTION;  Surgeon: Rush Landmark Telford Nab., MD;  Location: [facility] ENDOSCOPY;  Service: Gastroenterology;;       Family History  Problem Relation Age of Onset  . Heart disease Mother   . Hypertension Mother   . Heart failure Mother   . Heart disease Father   . Hypertension Sister   . Heart attack Neg Hx   . Stroke Neg Hx   . Colon cancer Neg Hx   . Esophageal cancer Neg Hx     Social History   Tobacco Use  . Smoking status: Former Smoker    Packs/day: 1.00    Years: 34.00    Pack years: 34.00    Types: Cigarettes    Quit date: 11/30/2018    Years since quitting: 1.9  . Smokeless tobacco: Never Used  Vaping Use  . Vaping Use: Never used  Substance  Use Topics  . Alcohol use: No    Alcohol/week: 0.0 standard drinks  . Drug use: No    Home Medications Prior to Admission medications   Medication Sig Start Date End Date Taking? Authorizing Provider  acetaminophen (TYLENOL) 325 MG tablet Take 2 tablets (650 mg total) by mouth every 4 (four) hours as needed for headache or mild pain. 10/09/20   Geradine Girt, DO  acidophilus (RISAQUAD) CAPS capsule Take 2 capsules by mouth daily. Patient not taking: Reported on 10/29/2020 10/10/20   Eulogio Bear U, DO  albuterol (PROVENTIL) (2.5 MG/3ML) 0.083% nebulizer solution Take 3 mLs (2.5 mg total) by nebulization every 4 (four) hours as needed for wheezing or shortness of breath. 06/21/20   Tanda Rockers, MD  albuterol (VENTOLIN HFA) 108 (90 Base) MCG/ACT inhaler Inhale 2 puffs into the lungs every 6 (six) hours as needed for wheezing or shortness of breath.    [provider]  amiodarone (PACERONE) 200 MG tablet Take 0.5 tablets (100 mg total) by mouth daily. 08/28/20   Larey Dresser, MD  amoxicillin (AMOXIL) 500 MG capsule Take 500 mg by mouth 3 (three) times daily.    [provider]  amoxicillin-clavulanate (AUGMENTIN) 875-125 MG tablet Take 1 tablet by mouth daily. Patient not taking: Reported on 10/29/2020 10/09/20   Geradine Girt, DO  apixaban (ELIQUIS) 5 MG TABS tablet Take 1 tablet (5 mg total) by mouth 2 (two) times daily. Patient taking differently: Take 2.5 mg by mouth 2 (two) times daily. 08/29/19   Hosie Poisson, MD  ascorbic acid (VITAMIN C) 500 MG tablet Take 500 mg by mouth 3 (three) times a week. Monday Wednesday Friday    [provider]  atorvastatin (LIPITOR) 40 MG tablet Take 1 tablet (40 mg total) by mouth daily. Patient not taking: Reported on 10/29/2020 10/10/20   Geradine Girt, DO  Budeson-Glycopyrrol-Formoterol (BREZTRI AEROSPHERE) 160-9-4.8 MCG/ACT AERO Inhale 2 puffs into the lungs 2 (two) times daily. 06/21/20   Tanda Rockers, MD   carbamide peroxide (DEBROX) 6.5 % OTIC solution Place 5-10 drops into both ears 3 (three) times daily.    [provider]  Carboxymethylcellulose Sod PF 0.25 % SOLN Place 1 drop into both eyes 4 (four) times daily.    [provider]  cetirizine (ZYRTEC) 10 MG tablet Take 10 mg by mouth daily as needed for allergies.     [provider]  diazepam (VALIUM) 2 MG tablet Take 1 tablet (2 mg  total) by mouth 2 (two) times daily. 10/09/20   Geradine Girt, DO  famotidine (PEPCID) 20 MG tablet TAKE 1 TABLET(20 MG) BY MOUTH TWICE DAILY 10/29/20   Larey Dresser, MD  fluticasone [facility]) 50 MCG/ACT nasal spray Place 2 sprays into both nostrils daily as needed for allergies.     [provider]  hydrocortisone (ANUSOL-HC) 2.5 % rectal cream Place 1 application rectally 2 (two) times daily as needed for hemorrhoids or itching.     [provider]  hydrocortisone 1 % lotion Apply 1 application topically See admin instructions. Apply small amount to back twice daily for itchy rash as needed    [provider]  insulin aspart (NOVOLOG) 100 UNIT/ML injection Inject 0-5 Units into the skin at bedtime. 10/09/20   Geradine Girt, DO  insulin aspart (NOVOLOG) 100 UNIT/ML injection Inject 0-15 Units into the skin 3 (three) times daily with meals. 10/09/20   Geradine Girt, DO  Insulin Syringe 27G X 1/2" 0.5 ML MISC 100 Syringes by Does not apply route 3 (three) times daily. 08/29/19   Hosie Poisson, MD  latanoprost (XALATAN) 0.005 % ophthalmic solution Place 1 drop into both eyes at bedtime as needed (dry eyes).     [provider]  lidocaine (LIDODERM) 5 % Place 1 patch onto the skin daily. Remove & Discard patch within 12 hours or as directed by MD 10/10/20   Geradine Girt, DO  OXYGEN Inhale 3 L into the lungs continuous.     [provider]  potassium chloride SA (KLOR-CON) 20 MEQ tablet Take 2 tablets (40 mEq total) by mouth 3 (three) times  daily. 10/09/20   Geradine Girt, DO  PRESCRIPTION MEDICATION Cpap    [provider]  sodium chloride (OCEAN) 0.65 % SOLN nasal spray Place 2 sprays into both nostrils as needed for congestion.     [provider]  tamsulosin (FLOMAX) 0.4 MG CAPS capsule Take 1 capsule (0.4 mg total) by mouth daily. Patient not taking: Reported on 10/29/2020 10/09/20   Geradine Girt, DO  torsemide (DEMADEX) 20 MG tablet Take 4 tablets (80 mg total) by mouth 2 (two) times daily. 11/18/19   Larey Dresser, MD  zinc oxide 20 % ointment Apply 1 application topically 2 (two) times daily as needed for irritation (rash).     [provider]    Allergies    Bidil [isosorb dinitrate-hydralazine], Benazepril, Brimonidine, Metformin, and Morphine  Review of Systems   Review of Systems  Respiratory: Positive for shortness of breath.   All other systems reviewed and are negative.   Physical Exam Updated Vital Signs BP (!) 176/108   Pulse (!) 53   Temp 98.3 F (36.8 C) (Oral)   Resp (!) 24   SpO2 100%   Physical Exam Vitals and nursing note reviewed.  Constitutional:      Appearance: He is well-developed and well-nourished.  HENT:     Head: Normocephalic and atraumatic.     Mouth/Throat:     Mouth: Oropharynx is clear and moist.  Eyes:     Extraocular Movements: EOM normal.     Conjunctiva/sclera: Conjunctivae normal.     Pupils: Pupils are equal, round, and reactive to light.  Cardiovascular:     Rate and Rhythm: Normal rate and regular rhythm.     Heart sounds: Normal heart sounds.  Pulmonary:     Effort: Pulmonary effort is normal.     Breath sounds: Rales present.  No rhonchi.     Comments: Tachypneic, able to speak in shortened sentences, NRB in place Abdominal:     General: Bowel sounds are normal.     Palpations: Abdomen is soft.     Comments: Obese abdomen  Musculoskeletal:        General: Normal range of motion.     Cervical back: Normal range of motion.      Comments: Trace edema BLE  Skin:    General: Skin is warm and dry.  Neurological:     Mental Status: He is alert and oriented to person, place, and time.  Psychiatric:        Mood and Affect: Mood and affect normal.     ED Results / Procedures / Treatments   Labs (all labs ordered are listed, but only abnormal results are displayed) Labs Reviewed  BASIC METABOLIC PANEL - Abnormal; Notable for the following components:      Result Value   Sodium 131 (*)    Potassium >7.5 (*)    CO2 18 (*)    Glucose, Bld 187 (*)    BUN 30 (*)    Creatinine, Ser 2.34 (*)    GFR, Estimated 29 (*)    All other components within normal limits  CBC - Abnormal; Notable for the following components:   RBC 3.84 (*)    Hemoglobin 10.2 (*)    HCT 34.8 (*)    MCHC 29.3 (*)    RDW 21.2 (*)    nRBC 0.7 (*)    All other components within normal limits  BRAIN NATRIURETIC PEPTIDE - Abnormal; Notable for the following components:   B Natriuretic Peptide 2,913.5 (*)    All other components within normal limits  TROPONIN I (HIGH SENSITIVITY) - Abnormal; Notable for the following components:   Troponin I (High Sensitivity) 34 (*)    All other components within normal limits  RESP PANEL BY RT-PCR (FLU A&B, COVID) ARPGX2  MAGNESIUM  PHOSPHORUS  COMPREHENSIVE METABOLIC PANEL  CBC  POTASSIUM  POTASSIUM  POTASSIUM  POTASSIUM  POTASSIUM  POTASSIUM  POTASSIUM  POTASSIUM  POTASSIUM  POTASSIUM  POTASSIUM  OSMOLALITY  TROPONIN I (HIGH SENSITIVITY)    EKG EKG Interpretation  Date/Time:  Monday October 29 2020 22:56:48 EST Ventricular Rate:  53 PR Interval:    QRS Duration: 240 QT Interval:  519 QTC Calculation: 488 R Axis:   -153 Text Interpretation: Atrial fibrillation Nonspecific intraventricular conduction delay agree, significant QRS widening compared to previous Confirmed by Charlesetta Shanks [phone]) on 10/29/2020 11:08:05 PM   Radiology DG Chest Portable 1 View  Result Date:  10/29/2020 CLINICAL DATA:  Shortness of breath EXAM: PORTABLE CHEST 1 VIEW COMPARISON:  10/05/2020 CT, chest x-ray 09/25/2020, 06/21/2020, 07/07/2019 FINDINGS: Cardiomegaly with vascular congestion and mild edema. Small pleural effusions. Persistent airspace disease at the left lung base. Aortic atherosclerosis. IMPRESSION: Cardiomegaly with vascular congestion, mild edema and small pleural effusions. Persistent airspace disease at the left base, some of which appears chronic. Electronically Signed   By: Donavan Foil M.D.   On: 10/29/2020 23:01    Procedures Procedures (including critical care time)  CRITICAL CARE Performed by: Larene Pickett   Total critical care time: 45 minutes  Critical care time was exclusive of separately billable procedures and treating other patients.  Critical care was necessary to treat or prevent imminent or life-threatening deterioration.  Critical care was time spent personally by me on the following activities: development of treatment plan with patient and/or  surrogate as well as nursing, discussions with consultants, evaluation of patient's response to treatment, examination of patient, obtaining history from patient or surrogate, ordering and performing treatments and interventions, ordering and review of laboratory studies, ordering and review of radiographic studies, pulse oximetry and re-evaluation of patient's condition.   Medications Ordered in ED Medications  atorvastatin (LIPITOR) tablet 40 mg (has no administration in time range)  famotidine (PEPCID) tablet 20 mg (has no administration in time range)  apixaban (ELIQUIS) tablet 2.5 mg (has no administration in time range)  albuterol (VENTOLIN HFA) 108 (90 Base) MCG/ACT inhaler 2 puff (has no administration in time range)  Budeson-Glycopyrrol-Formoterol 160-9-4.8 MCG/ACT AERO 2 puff (has no administration in time range)  lidocaine (LIDODERM) 5 % 1 patch (has no administration in time range)  sodium  chloride flush (NS) 0.9 % injection 3 mL (has no administration in time range)  acetaminophen (TYLENOL) tablet 650 mg (has no administration in time range)    Or  acetaminophen (TYLENOL) suppository 650 mg (has no administration in time range)  albuterol (PROVENTIL) (2.5 MG/3ML) 0.083% nebulizer solution 10 mg (has no administration in time range)  furosemide (LASIX) injection 40 mg (40 mg Intravenous Given 10/29/20 2309)  insulin aspart (novoLOG) injection 5 Units (5 Units Intravenous Given 10/30/20 0010)  dextrose 50 % solution 50 mL (50 mLs Intravenous Given 10/30/20 0001)  calcium gluconate inj 10% (1 g) URGENT USE ONLY! (1 g Intravenous Given 10/30/20 0002)    ED Course  I have reviewed the triage vital signs and the nursing notes.  Pertinent labs & imaging results that were available during my care of the patient were reviewed by me and considered in my medical decision making (see chart for details).    MDM Rules/Calculators/A&P  74 y.o. M here with SOB.  States started having symptoms yesterday but worse today.  SOB notably worse when lying flat, better sitting up.  No fever, chills, sweats.  Did miss his home demadex dose today.  On arrival to ED, he is on NRB but mentating appropriately.  He is tachypneic but speaking in shortened sentences.  He has rales bilaterally, trace edema at ankles.  Concern for CHF.  CXR with pulmonary edema.  Given dose of IV lasix.  Labs pending.  Potassium has come back >7.5.  He is on K+ supplementation at home, unsure if he has accidentally taken extra, states wife handles his medications.  Per chart review, it does appear there were some mix ups with his meds upon discharge from SNF so unsure if this is contributing.  Given insulin, D50, calcium gluconate.  BNP 2913.  Trop 34, likely some demand ischemia.  covid screen is negative.  Will admit for ongoing care.  Discussed with hospitalist, Dr. Trilby Drummer-- he will admit, request nephology consult.  12:27  AM Discussed with nephrology, Dr. Joelyn Oms-- continue management currently started, can repeat doses of calcium gluconate for continued wide QRS complex.  Can add bicarb if needed.  Can use lokelma in the coming days if needed. They will see for consult.  Final Clinical Impression(s) / ED Diagnoses Final diagnoses:  Acute on chronic congestive heart failure, unspecified heart failure type [facility])  Hyperkalemia    Rx / DC Orders ED Discharge Orders    None       Larene Pickett, PA-C 67/61/95 0932    Delora Fuel, MD 67/12/45 [phone]

## 2020-10-29 NOTE — ED Triage Notes (Signed)
Pt presents to ED BIB [facility]. Pt c/o sudden onset SOB. Pt reports that it began while lying down. Fire dept found pt at 80% RA. Placed pt on 25L O2. EMS given douneb x1 and brought pt down to 10L NRB. Pt still tachypniec w/ labored breathing. Pt has hx CHF, pt reports small recent weight gain, compliant w/ meds.

## 2020-10-29 NOTE — ED Notes (Signed)
Unable to get accurate pulse ox in triage

## 2020-10-30 ENCOUNTER — Encounter (HOSPITAL_COMMUNITY): Payer: Self-pay | Admitting: Internal Medicine

## 2020-10-30 ENCOUNTER — Telehealth (HOSPITAL_COMMUNITY): Payer: Self-pay

## 2020-10-30 ENCOUNTER — Other Ambulatory Visit: Payer: Self-pay

## 2020-10-30 DIAGNOSIS — Z66 Do not resuscitate: Secondary | ICD-10-CM | POA: Diagnosis present

## 2020-10-30 DIAGNOSIS — N184 Chronic kidney disease, stage 4 (severe): Secondary | ICD-10-CM | POA: Diagnosis present

## 2020-10-30 DIAGNOSIS — I130 Hypertensive heart and chronic kidney disease with heart failure and stage 1 through stage 4 chronic kidney disease, or unspecified chronic kidney disease: Secondary | ICD-10-CM | POA: Diagnosis present

## 2020-10-30 DIAGNOSIS — E875 Hyperkalemia: Principal | ICD-10-CM

## 2020-10-30 DIAGNOSIS — Z888 Allergy status to other drugs, medicaments and biological substances status: Secondary | ICD-10-CM | POA: Diagnosis not present

## 2020-10-30 DIAGNOSIS — E7800 Pure hypercholesterolemia, unspecified: Secondary | ICD-10-CM | POA: Diagnosis present

## 2020-10-30 DIAGNOSIS — I4892 Unspecified atrial flutter: Secondary | ICD-10-CM | POA: Diagnosis present

## 2020-10-30 DIAGNOSIS — I482 Chronic atrial fibrillation, unspecified: Secondary | ICD-10-CM | POA: Diagnosis present

## 2020-10-30 DIAGNOSIS — J449 Chronic obstructive pulmonary disease, unspecified: Secondary | ICD-10-CM | POA: Diagnosis present

## 2020-10-30 DIAGNOSIS — G8929 Other chronic pain: Secondary | ICD-10-CM | POA: Diagnosis present

## 2020-10-30 DIAGNOSIS — Z87891 Personal history of nicotine dependence: Secondary | ICD-10-CM | POA: Diagnosis not present

## 2020-10-30 DIAGNOSIS — I5033 Acute on chronic diastolic (congestive) heart failure: Secondary | ICD-10-CM | POA: Diagnosis present

## 2020-10-30 DIAGNOSIS — Z8249 Family history of ischemic heart disease and other diseases of the circulatory system: Secondary | ICD-10-CM | POA: Diagnosis not present

## 2020-10-30 DIAGNOSIS — L89159 Pressure ulcer of sacral region, unspecified stage: Secondary | ICD-10-CM | POA: Diagnosis present

## 2020-10-30 DIAGNOSIS — Z9081 Acquired absence of spleen: Secondary | ICD-10-CM | POA: Diagnosis not present

## 2020-10-30 DIAGNOSIS — Z20822 Contact with and (suspected) exposure to covid-19: Secondary | ICD-10-CM | POA: Diagnosis present

## 2020-10-30 DIAGNOSIS — J9621 Acute and chronic respiratory failure with hypoxia: Secondary | ICD-10-CM | POA: Diagnosis present

## 2020-10-30 DIAGNOSIS — E662 Morbid (severe) obesity with alveolar hypoventilation: Secondary | ICD-10-CM | POA: Diagnosis present

## 2020-10-30 DIAGNOSIS — E876 Hypokalemia: Secondary | ICD-10-CM | POA: Diagnosis present

## 2020-10-30 DIAGNOSIS — I2510 Atherosclerotic heart disease of native coronary artery without angina pectoris: Secondary | ICD-10-CM | POA: Diagnosis present

## 2020-10-30 DIAGNOSIS — Z7901 Long term (current) use of anticoagulants: Secondary | ICD-10-CM | POA: Diagnosis not present

## 2020-10-30 DIAGNOSIS — Z885 Allergy status to narcotic agent status: Secondary | ICD-10-CM | POA: Diagnosis not present

## 2020-10-30 DIAGNOSIS — E785 Hyperlipidemia, unspecified: Secondary | ICD-10-CM | POA: Diagnosis present

## 2020-10-30 DIAGNOSIS — M549 Dorsalgia, unspecified: Secondary | ICD-10-CM | POA: Diagnosis present

## 2020-10-30 LAB — COMPREHENSIVE METABOLIC PANEL
ALT: 19 U/L (ref 0–44)
AST: 25 U/L (ref 15–41)
Albumin: 2.9 g/dL — ABNORMAL LOW (ref 3.5–5.0)
Alkaline Phosphatase: 134 U/L — ABNORMAL HIGH (ref 38–126)
Anion gap: 6 (ref 5–15)
BUN: 29 mg/dL — ABNORMAL HIGH (ref 8–23)
CO2: 17 mmol/L — ABNORMAL LOW (ref 22–32)
Calcium: 8.8 mg/dL — ABNORMAL LOW (ref 8.9–10.3)
Chloride: 107 mmol/L (ref 98–111)
Creatinine, Ser: 2.39 mg/dL — ABNORMAL HIGH (ref 0.61–1.24)
GFR, Estimated: 28 mL/min — ABNORMAL LOW (ref >60)
Glucose, Bld: 180 mg/dL — ABNORMAL HIGH (ref 70–99)
Potassium: >7.5 mmol/L (ref 3.5–5.1)
Sodium: 130 mmol/L — ABNORMAL LOW (ref 135–145)
Total Bilirubin: 0.5 mg/dL (ref 0.3–1.2)
Total Protein: 6.7 g/dL (ref 6.5–8.1)

## 2020-10-30 LAB — CBG MONITORING, ED: Glucose-Capillary: 96 mg/dL (ref 70–99)

## 2020-10-30 LAB — RENAL FUNCTION PANEL
Albumin: 2.9 g/dL — ABNORMAL LOW (ref 3.5–5.0)
Anion gap: 10 (ref 5–15)
BUN: 29 mg/dL — ABNORMAL HIGH (ref 8–23)
CO2: 20 mmol/L — ABNORMAL LOW (ref 22–32)
Calcium: 9.8 mg/dL (ref 8.9–10.3)
Chloride: 105 mmol/L (ref 98–111)
Creatinine, Ser: 2.31 mg/dL — ABNORMAL HIGH (ref 0.61–1.24)
GFR, Estimated: 29 mL/min — ABNORMAL LOW (ref >60)
Glucose, Bld: 89 mg/dL (ref 70–99)
Phosphorus: 3.7 mg/dL (ref 2.5–4.6)
Potassium: 6 mmol/L — ABNORMAL HIGH (ref 3.5–5.1)
Sodium: 135 mmol/L (ref 135–145)

## 2020-10-30 LAB — OSMOLALITY: Osmolality: 294 mOsm/kg (ref 275–295)

## 2020-10-30 LAB — POTASSIUM
Potassium: >7.5 mmol/L (ref 3.5–5.1)
Potassium: 4.7 mmol/L (ref 3.5–5.1)
Potassium: 4.7 mmol/L (ref 3.5–5.1)
Potassium: 4.5 mmol/L (ref 3.5–5.1)

## 2020-10-30 LAB — CBC
HCT: 37.1 % — ABNORMAL LOW (ref 39.0–52.0)
Hemoglobin: 10.4 g/dL — ABNORMAL LOW (ref 13.0–17.0)
MCH: 25.9 pg — ABNORMAL LOW (ref 26.0–34.0)
MCHC: 28 g/dL — ABNORMAL LOW (ref 30.0–36.0)
MCV: 92.3 fL (ref 80.0–100.0)
Platelets: 358 10*3/uL (ref 150–400)
RBC: 4.02 MIL/uL — ABNORMAL LOW (ref 4.22–5.81)
RDW: 21.2 % — ABNORMAL HIGH (ref 11.5–15.5)
WBC: 7.1 10*3/uL (ref 4.0–10.5)
nRBC: 0.6 % — ABNORMAL HIGH (ref 0.0–0.2)

## 2020-10-30 LAB — PHOSPHORUS: Phosphorus: 2.7 mg/dL (ref 2.5–4.6)

## 2020-10-30 LAB — GLUCOSE, CAPILLARY
Glucose-Capillary: 128 mg/dL — ABNORMAL HIGH (ref 70–99)
Glucose-Capillary: 116 mg/dL — ABNORMAL HIGH (ref 70–99)
Glucose-Capillary: 90 mg/dL (ref 70–99)

## 2020-10-30 LAB — TROPONIN I (HIGH SENSITIVITY)
Troponin I (High Sensitivity): 34 ng/L — ABNORMAL HIGH (ref <18)
Troponin I (High Sensitivity): 45 ng/L — ABNORMAL HIGH (ref <18)

## 2020-10-30 LAB — MAGNESIUM: Magnesium: 1.9 mg/dL (ref 1.7–2.4)

## 2020-10-30 MED ORDER — DICLOFENAC SODIUM 1 % EX GEL
4.0000 g | Freq: Four times a day (QID) | CUTANEOUS | Status: DC
  Administered 2020-10-31 (×4): 4 g via TOPICAL
  Filled 2020-10-30: qty 100

## 2020-10-30 MED ORDER — INSULIN ASPART 100 UNIT/ML IV SOLN
5.0000 [IU] | Freq: Once | INTRAVENOUS | Status: AC
  Administered 2020-10-30: 04:00:00 5 [IU] via INTRAVENOUS

## 2020-10-30 MED ORDER — ARFORMOTEROL TARTRATE 15 MCG/2ML IN NEBU
15.0000 ug | INHALATION_SOLUTION | Freq: Two times a day (BID) | RESPIRATORY_TRACT | Status: DC
  Administered 2020-10-30 – 2020-11-01 (×5): 15 ug via RESPIRATORY_TRACT
  Filled 2020-10-30 (×5): qty 2

## 2020-10-30 MED ORDER — AMIODARONE HCL 200 MG PO TABS
100.0000 mg | ORAL_TABLET | Freq: Every day | ORAL | Status: DC
  Administered 2020-10-30 – 2020-11-01 (×3): 100 mg via ORAL
  Filled 2020-10-30 (×3): qty 1

## 2020-10-30 MED ORDER — ACETAMINOPHEN 325 MG PO TABS
650.0000 mg | ORAL_TABLET | Freq: Four times a day (QID) | ORAL | Status: DC | PRN
  Administered 2020-10-30: 650 mg via ORAL
  Filled 2020-10-30: qty 2

## 2020-10-30 MED ORDER — CALCIUM GLUCONATE-NACL 2-0.675 GM/100ML-% IV SOLN
2.0000 g | Freq: Once | INTRAVENOUS | Status: AC
  Administered 2020-10-30: 07:00:00 2000 mg via INTRAVENOUS
  Filled 2020-10-30: qty 100

## 2020-10-30 MED ORDER — ALBUTEROL SULFATE (2.5 MG/3ML) 0.083% IN NEBU
10.0000 mg | INHALATION_SOLUTION | Freq: Once | RESPIRATORY_TRACT | Status: AC
  Administered 2020-10-30: 01:00:00 10 mg via RESPIRATORY_TRACT
  Filled 2020-10-30: qty 12

## 2020-10-30 MED ORDER — ALBUTEROL SULFATE HFA 108 (90 BASE) MCG/ACT IN AERS: 2.0000 | INHALATION_SPRAY | Freq: Four times a day (QID) | RESPIRATORY_TRACT | Status: DC | PRN

## 2020-10-30 MED ORDER — FUROSEMIDE 10 MG/ML IJ SOLN: 80.0000 mg | Freq: Two times a day (BID) | INTRAMUSCULAR | Status: DC

## 2020-10-30 MED ORDER — ATORVASTATIN CALCIUM 40 MG PO TABS: 40.0000 mg | ORAL_TABLET | Freq: Every day | ORAL | 3 refills | Status: AC

## 2020-10-30 MED ORDER — FAMOTIDINE 20 MG PO TABS
20.0000 mg | ORAL_TABLET | Freq: Two times a day (BID) | ORAL | Status: DC
  Administered 2020-10-30: 20 mg via ORAL
  Filled 2020-10-30: qty 1

## 2020-10-30 MED ORDER — CALCIUM GLUCONATE-NACL 1-0.675 GM/50ML-% IV SOLN: 1.0000 g | Freq: Once | INTRAVENOUS | Status: DC

## 2020-10-30 MED ORDER — BUDESONIDE 0.25 MG/2ML IN SUSP
0.2500 mg | Freq: Two times a day (BID) | RESPIRATORY_TRACT | Status: DC
  Administered 2020-10-30 – 2020-11-01 (×5): 0.25 mg via RESPIRATORY_TRACT
  Filled 2020-10-30 (×5): qty 2

## 2020-10-30 MED ORDER — FUROSEMIDE 10 MG/ML IJ SOLN
80.0000 mg | Freq: Three times a day (TID) | INTRAMUSCULAR | Status: DC
  Administered 2020-10-30 – 2020-10-31 (×3): 80 mg via INTRAVENOUS
  Filled 2020-10-30 (×3): qty 8

## 2020-10-30 MED ORDER — OXYCODONE HCL 5 MG PO TABS
10.0000 mg | ORAL_TABLET | Freq: Once | ORAL | Status: AC
  Administered 2020-10-30: 10 mg via ORAL
  Filled 2020-10-30: qty 2

## 2020-10-30 MED ORDER — APIXABAN 2.5 MG PO TABS
2.5000 mg | ORAL_TABLET | Freq: Two times a day (BID) | ORAL | Status: DC
  Administered 2020-10-30 – 2020-11-01 (×5): 2.5 mg via ORAL
  Filled 2020-10-30 (×5): qty 1

## 2020-10-30 MED ORDER — UMECLIDINIUM BROMIDE 62.5 MCG/INH IN AEPB
1.0000 | INHALATION_SPRAY | Freq: Every day | RESPIRATORY_TRACT | Status: DC
  Administered 2020-10-30 – 2020-11-01 (×3): 1 via RESPIRATORY_TRACT
  Filled 2020-10-30: qty 7

## 2020-10-30 MED ORDER — FUROSEMIDE 10 MG/ML IJ SOLN
40.0000 mg | INTRAMUSCULAR | Status: AC
  Administered 2020-10-30: 01:00:00 40 mg via INTRAVENOUS
  Filled 2020-10-30: qty 4

## 2020-10-30 MED ORDER — CALCIUM GLUCONATE-NACL 2-0.675 GM/100ML-% IV SOLN
2.0000 g | Freq: Once | INTRAVENOUS | Status: AC
  Administered 2020-10-30: 03:00:00 2000 mg via INTRAVENOUS
  Filled 2020-10-30: qty 100

## 2020-10-30 MED ORDER — SODIUM CHLORIDE 0.9% FLUSH
3.0000 mL | Freq: Two times a day (BID) | INTRAVENOUS | Status: DC
  Administered 2020-10-30 – 2020-10-31 (×5): 3 mL via INTRAVENOUS

## 2020-10-30 MED ORDER — SODIUM ZIRCONIUM CYCLOSILICATE 10 G PO PACK
10.0000 g | PACK | Freq: Once | ORAL | Status: AC
  Administered 2020-10-30: 04:00:00 10 g via ORAL
  Filled 2020-10-30: qty 1

## 2020-10-30 MED ORDER — LIDOCAINE 5 % EX PTCH
1.0000 | MEDICATED_PATCH | Freq: Every day | CUTANEOUS | Status: DC | PRN
  Administered 2020-10-31: 1 via TRANSDERMAL
  Filled 2020-10-30: qty 1

## 2020-10-30 MED ORDER — FAMOTIDINE 20 MG PO TABS
20.0000 mg | ORAL_TABLET | Freq: Every day | ORAL | Status: DC
Start: 2020-10-31 — End: 2020-11-01
  Administered 2020-10-31 – 2020-11-01 (×2): 20 mg via ORAL
  Filled 2020-10-30 (×2): qty 1

## 2020-10-30 MED ORDER — ATORVASTATIN CALCIUM 40 MG PO TABS
40.0000 mg | ORAL_TABLET | Freq: Every day | ORAL | Status: DC
  Administered 2020-10-30 – 2020-11-01 (×3): 40 mg via ORAL
  Filled 2020-10-30 (×3): qty 1

## 2020-10-30 MED ORDER — FUROSEMIDE 10 MG/ML IJ SOLN
120.0000 mg | Freq: Three times a day (TID) | INTRAVENOUS | Status: DC
  Administered 2020-10-30 (×2): 120 mg via INTRAVENOUS
  Filled 2020-10-30 (×2): qty 10
  Filled 2020-10-30 (×2): qty 12

## 2020-10-30 MED ORDER — SODIUM ZIRCONIUM CYCLOSILICATE 10 G PO PACK
10.0000 g | PACK | Freq: Three times a day (TID) | ORAL | Status: DC
  Administered 2020-10-30 (×4): 10 g via ORAL
  Filled 2020-10-30 (×4): qty 1

## 2020-10-30 MED ORDER — ACETAMINOPHEN 650 MG RE SUPP: 650.0000 mg | Freq: Four times a day (QID) | RECTAL | Status: DC | PRN

## 2020-10-30 MED ORDER — BUDESON-GLYCOPYRROL-FORMOTEROL 160-9-4.8 MCG/ACT IN AERO: 2.0000 | INHALATION_SPRAY | Freq: Two times a day (BID) | RESPIRATORY_TRACT | Status: DC

## 2020-10-30 MED ORDER — INSULIN ASPART 100 UNIT/ML ~~LOC~~ SOLN
0.0000 [IU] | Freq: Three times a day (TID) | SUBCUTANEOUS | Status: DC
  Administered 2020-10-30: 1 [IU] via SUBCUTANEOUS

## 2020-10-30 MED ORDER — DEXTROSE 50 % IV SOLN
1.0000 | Freq: Once | INTRAVENOUS | Status: AC
  Administered 2020-10-30: 04:00:00 50 mL via INTRAVENOUS
  Filled 2020-10-30: qty 50

## 2020-10-30 NOTE — Discharge Instructions (Signed)

## 2020-10-30 NOTE — Telephone Encounter (Signed)
-----  Message from Marylouise Stacks, EMT sent at 10/29/2020  1:20 PM EST ----- Also his rosuvastatin was changed to atorvastatin, is there anyway you can send that in for refill?   I dont see a f/u sch with the providers either, can you see when he needs to come in post d/c admit and he left SNF AMA last week.   Marylouise Stacks, EMT-Paramedic  10/29/20

## 2020-10-30 NOTE — Consult Note (Signed)
Nicholas Caldwell Admit Date: 10/29/2020 10/30/2020 Rexene Agent Requesting Physician:  Trilby Drummer MD  Reason for Consult:  Hyperkalemia HPI:  46M presents to the ED with extensive PMH including severe chronic systolic heart failure with RV failure, CKD4, COPD, OHS/OSA on chronic supplemental oxygen/CPAP, ischemic cardiomyopathy and recent admission 11/23 through 10/09/2020 for decompensated heart failure/respiratory failure.  At this time his presenting symptoms were of progressive dyspnea, especially orthopnea.  He was discharged to SNF at previous admission but returned to home.  He was hypoxic when evaluated by EMS and is currently receiving a nonrebreather.  Home medications include potassium chloride 120 mEq daily, torsemide 80 mg twice daily.  He is not on an ACE inhibitor/ARB.  He was trying to take in high potassium foods because of historical trend towards hypokalemia.  Here his potassium is greater than 7.5.  During his last admission his QRS is less than 120.  Currently is 205.  He has developed bradycardia with a heart rate in the 40s and 50s.  In the ED he has received 1 g of calcium gluconate, albuterol 10 mg nebulizer, 5 units of IV insulin, and 40 mg of IV Lasix.  Creatinine is 2.34 which is better than his baseline, he had significant AKI during his previous admission.  He was seen by nephrology during that admission and was not considered to be a candidate for chronic hemodialysis because of severe comorbidities.  At the current time he is conversant.  States the dyspnea is worse when flat.  No other family is available.  He does not recall his medications but does know that he has been taking his potassium.    Creat (mg/dL)  Date Value  03/24/2016 0.76  10/01/2015 0.79  09/12/2015 0.80  08/27/2015 0.91   Creatinine, Ser (mg/dL)  Date Value  10/29/2020 2.34 (H)  10/09/2020 2.44 (H)  10/08/2020 2.71 (H)  10/07/2020 2.79 (H)  10/06/2020 2.84 (H)  10/05/2020 3.02 (H)   10/04/2020 3.10 (H)  10/03/2020 3.25 (H)  10/03/2020 2.97 (H)  10/02/2020 3.06 (H)  ] ROS NSAIDS: No identified exposure IV Contrast no exposure TMP/SMX no identified exposure Balance of 12 systems is negative w/ exceptions as above  PMH  Past Medical History:  Diagnosis Date  . Atrial flutter ([place])    a. recurrent AFlutter with RVR;  b. Amiodarone Rx started 4/16  . CAD (coronary artery disease)    a. LHC 1/16:  mLAD diffuse disease, pLCx mild disease, dLCx with disease but too small for PCI, RCA ok, EF 25-30%  . Chronic pain   . Chronic systolic CHF (congestive heart failure) ([place])   . COPD (chronic obstructive pulmonary disease) ([place])   . Diabetes mellitus without complication ([place])   . Ex-smoker 11/2017  . Hypercholesteremia   . Hypertension   . NICM (nonischemic cardiomyopathy) ([place])    a.dx 2016. b. 2D echo 06/2016 - Last echo 07/01/16: mod dilated LV, mod LVH, EF 25-30%, mild-mod MR, sev LAE, mild-mod reduced RV systolic function, mild-mod TR, PASP 16mHG.  .Marland KitchenPAF (paroxysmal atrial fibrillation) (H[facility])    On amio - ot a candidate for flecainide due to cardiomyopathy, not a candidate for Tikosyn due to prolonged QT, and felt to be a poor candidate for ablation given left atrial size.  . Pulmonary hypertension ([place]   . Tobacco abuse    PSH  Past Surgical History:  Procedure Laterality Date  . BIOPSY  03/11/2019   Procedure: BIOPSY;  Surgeon: CLavena Bullion DO;  Location:  [place] ENDOSCOPY;  Service: Gastroenterology;;  . BIOPSY  03/15/2019   Procedure: BIOPSY;  Surgeon: Irving Copas., MD;  Location: [place];  Service: Gastroenterology;;  . CARDIOVERSION N/A 07/30/2018   Procedure: CARDIOVERSION;  Surgeon: Larey Dresser, MD;  Location: [facility] ENDOSCOPY;  Service: Cardiovascular;  Laterality: N/A;  . COLONOSCOPY N/A 03/11/2019   Procedure: COLONOSCOPY;  Surgeon: Lavena Bullion, DO;  Location: [place];  Service: Gastroenterology;  Laterality: N/A;  .  ENTEROSCOPY N/A 03/15/2019   Procedure: ENTEROSCOPY;  Surgeon: Rush Landmark Telford Nab., MD;  Location: [place];  Service: Gastroenterology;  Laterality: N/A;  . ESOPHAGOGASTRODUODENOSCOPY Left 03/11/2019   Procedure: ESOPHAGOGASTRODUODENOSCOPY (EGD);  Surgeon: Lavena Bullion, DO;  Location: [facility] ENDOSCOPY;  Service: Gastroenterology;  Laterality: Left;  . LEFT AND RIGHT HEART CATHETERIZATION WITH CORONARY ANGIOGRAM N/A 11/06/2014   Procedure: LEFT AND RIGHT HEART CATHETERIZATION WITH CORONARY ANGIOGRAM;  Surgeon: Jettie Booze, MD;  Location: [facility] CATH LAB;  Service: Cardiovascular;  Laterality: N/A;  . RIGHT/LEFT HEART CATH AND CORONARY ANGIOGRAPHY N/A 12/06/2018   Procedure: RIGHT/LEFT HEART CATH AND CORONARY ANGIOGRAPHY;  Surgeon: Larey Dresser, MD;  Location: [place] CV LAB;  Service: Cardiovascular;  Laterality: N/A;  . SPLENECTOMY    . SUBMUCOSAL TATTOO INJECTION  03/15/2019   Procedure: SUBMUCOSAL TATTOO INJECTION;  Surgeon: Rush Landmark Telford Nab., MD;  Location: [facility] ENDOSCOPY;  Service: Gastroenterology;;   FH  Family History  Problem Relation Age of Onset  . Heart disease Mother   . Hypertension Mother   . Heart failure Mother   . Heart disease Father   . Hypertension Sister   . Heart attack Neg Hx   . Stroke Neg Hx   . Colon cancer Neg Hx   . Esophageal cancer Neg Hx    SH  reports that he quit smoking about 23 months ago. His smoking use included cigarettes. He has a 34.00 pack-year smoking history. He has never used smokeless tobacco. He reports that he does not drink alcohol and does not use drugs. Allergies  Allergies  Allergen Reactions  . Bidil [Isosorb Dinitrate-Hydralazine] Shortness Of Breath  . Benazepril Nausea And Vomiting  . Brimonidine     Other reaction(s): Other (See Comments)  . Metformin     Other reaction(s): Cramps (ALLERGY/intolerance), Other (See Comments)  . Morphine Rash   Home medications Prior to Admission medications   Medication  Sig Start Date End Date Taking? Authorizing Provider  albuterol (VENTOLIN HFA) 108 (90 Base) MCG/ACT inhaler Inhale 2 puffs into the lungs every 6 (six) hours as needed for wheezing or shortness of breath.   Yes [provider]  amiodarone (PACERONE) 200 MG tablet Take 0.5 tablets (100 mg total) by mouth daily. 08/28/20  Yes Larey Dresser, MD  apixaban (ELIQUIS) 5 MG TABS tablet Take 1 tablet (5 mg total) by mouth 2 (two) times daily. Patient taking differently: Take 2.5 mg by mouth 2 (two) times daily. 08/29/19  Yes Hosie Poisson, MD  ascorbic acid (VITAMIN C) 500 MG tablet Take 500 mg by mouth 3 (three) times a week. Monday Wednesday Friday   Yes [provider]  atorvastatin (LIPITOR) 40 MG tablet Take 1 tablet (40 mg total) by mouth daily. 10/10/20  Yes Vann, Tomi Bamberger, DO  Budeson-Glycopyrrol-Formoterol (BREZTRI AEROSPHERE) 160-9-4.8 MCG/ACT AERO Inhale 2 puffs into the lungs 2 (two) times daily. 06/21/20  Yes Tanda Rockers, MD  carbamide peroxide (DEBROX) 6.5 % OTIC solution Place 5-10 drops into both ears daily as needed (  ear wax).   Yes [provider]  cetirizine (ZYRTEC) 10 MG tablet Take 10 mg by mouth daily as needed for allergies.    Yes [provider]  famotidine (PEPCID) 20 MG tablet TAKE 1 TABLET(20 MG) BY MOUTH TWICE DAILY Patient taking differently: Take 20 mg by mouth 2 (two) times daily. 10/29/20  Yes Larey Dresser, MD  fluticasone [facility]) 50 MCG/ACT nasal spray Place 2 sprays into both nostrils daily as needed for allergies.    Yes [provider]  hydrocortisone (ANUSOL-HC) 2.5 % rectal cream Place 1 application rectally 2 (two) times daily as needed for hemorrhoids or itching.    Yes [provider]  hydrocortisone 1 % lotion Apply 1 application topically See admin instructions. Apply small amount to back twice daily for itchy rash as needed   Yes [provider]  insulin aspart (NOVOLOG) 100 UNIT/ML  injection Inject 0-15 Units into the skin 3 (three) times daily with meals. 10/09/20  Yes Vann, Jessica U, DO  Insulin Syringe 27G X 1/2" 0.5 ML MISC 100 Syringes by Does not apply route 3 (three) times daily. 08/29/19  Yes Hosie Poisson, MD  latanoprost (XALATAN) 0.005 % ophthalmic solution Place 1 drop into both eyes at bedtime.   Yes [provider]  lidocaine (LIDODERM) 5 % Place 1 patch onto the skin daily. Remove & Discard patch within 12 hours or as directed by MD 10/10/20  Yes Geradine Girt, DO  metolazone (ZAROXOLYN) 2.5 MG tablet Take 2.5 mg by mouth See admin instructions. 1 tablet on Monday and Wednesday as needed  for weight gain of 3 pounds in 24 hours   Yes [provider]  oxyCODONE-acetaminophen (PERCOCET) 10-325 MG tablet Take 1 tablet by mouth every 6 (six) hours as needed for pain.   Yes [provider]  OXYGEN Inhale 3 L into the lungs continuous.    Yes [provider]  potassium chloride SA (KLOR-CON) 20 MEQ tablet Take 2 tablets (40 mEq total) by mouth 3 (three) times daily. 10/09/20  Yes Geradine Girt, DO  PRESCRIPTION MEDICATION Cpap   Yes [provider]  sodium chloride (OCEAN) 0.65 % SOLN nasal spray Place 2 sprays into both nostrils as needed for congestion.    Yes [provider]  tamsulosin (FLOMAX) 0.4 MG CAPS capsule Take 1 capsule (0.4 mg total) by mouth daily. 10/09/20  Yes Vann, Jessica U, DO  torsemide (DEMADEX) 20 MG tablet Take 4 tablets (80 mg total) by mouth 2 (two) times daily. 11/18/19  Yes Larey Dresser, MD  zinc oxide 20 % ointment Apply 1 application topically 2 (two) times daily as needed for irritation (rash).    Yes [provider]  acetaminophen (TYLENOL) 325 MG tablet Take 2 tablets (650 mg total) by mouth every 4 (four) hours as needed for headache or mild pain. 10/09/20   Geradine Girt, DO  acidophilus (RISAQUAD) CAPS capsule Take 2 capsules by mouth daily. Patient not taking: No sig  reported 10/10/20   Eulogio Bear U, DO  albuterol (PROVENTIL) (2.5 MG/3ML) 0.083% nebulizer solution Take 3 mLs (2.5 mg total) by nebulization every 4 (four) hours as needed for wheezing or shortness of breath. 06/21/20   Tanda Rockers, MD  amoxicillin-clavulanate (AUGMENTIN) 875-125 MG tablet Take 1 tablet by mouth daily. 10/09/20   Geradine Girt, DO  Carboxymethylcellulose Sod PF 0.25 % SOLN Place 1 drop into both eyes 4 (four) times daily.    [provider]  diazepam (VALIUM) 2 MG tablet Take 1 tablet (2 mg total) by mouth 2 (two) times daily. Patient not taking: No sig reported 10/09/20   Geradine Girt, DO    Current Medications Scheduled Meds: . albuterol  10 mg Nebulization Once  . apixaban  2.5 mg Oral BID  . atorvastatin  40 mg Oral Daily  . Budeson-Glycopyrrol-Formoterol  2 puff Inhalation BID  . famotidine  20 mg Oral BID  . furosemide  40 mg Intravenous STAT  . [START ON 10/31/2020] furosemide  80 mg Intravenous BID  . sodium chloride flush  3 mL Intravenous Q12H   Continuous Infusions: PRN Meds:.acetaminophen **OR** acetaminophen, albuterol, lidocaine  CBC Recent Labs  Lab 10/29/20 2252  WBC 7.5  HGB 10.2*  HCT 34.8*  MCV 90.6  PLT 863   Basic Metabolic Panel Recent Labs  Lab 10/29/20 2252 10/29/20 2309  NA 131*  --   K >7.5*  --   CL 105  --   CO2 18*  --   GLUCOSE 187*  --   BUN 30*  --   CREATININE 2.34*  --   CALCIUM 8.9  --   PHOS  --  2.7    Physical Exam  Blood pressure (!) 176/108, pulse (!) 53, temperature 98.3 F (36.8 C), temperature source Oral, resp. rate (!) 24, SpO2 100 %. GEN: Chronically ill-appearing, mild respiratory distress ENT: Wearing nonrebreather mask, NCAT EYES: EOMI CV: Bradycardic, irregular, no murmur PULM: Coarse breath sounds bilaterally, as above ABD: Soft, nontender SKIN: No rashes or lesions EXT: Trace peripheral edema  Assessment 18M CKD4 with severe hyperkalemia with bradycardia, QRS widening.   Etiology likely is high potassium supplementation, potential dietary contribution, in the setting of CKD4.  1. Hyperkalemia, severe, EKG changes 2. CKD4 3. Severe sCHF, RV Failure 4. Chronic Resp Faiulre, COPD, OSA/OHS, on NRB 5. Progressive FTT, has followed with palliative  6. Hx/o AFib 7. CAD/ASCVD 8. Historical hypokalemia  Plan 1. Given his comorbidities will attempt medical management initially 1. Give another 2gm Ca 2. Lokelma 10gm TID 3. Inc lasix to 120 IV  TID, first dose now 4. To rec neb at this time 5. q2h labs 2. If fails consider HD, but was not a candidate previously (long term) 3. Needs further discussion of [place] 4. Will follow along  Rexene Agent  10/30/2020, 1:21 AM

## 2020-10-30 NOTE — Progress Notes (Signed)
Patient complaining of chronic pain that is not relieved by what is ordered. RN paged on call via Amion to request what patient takes at home per patient request.

## 2020-10-30 NOTE — Plan of Care (Signed)

## 2020-10-30 NOTE — ED Notes (Signed)
Admitting notified of continued elevated potassium. Pt resting in stretcher, breathing nonlabored on NRB, hr 75 at this time.

## 2020-10-30 NOTE — Telephone Encounter (Signed)
Meds ordered this encounter  Medications   atorvastatin (LIPITOR) 40 MG tablet    Sig: Take 1 tablet (40 mg total) by mouth daily.    Dispense:  90 tablet    Refill:  3

## 2020-10-30 NOTE — Progress Notes (Signed)
TR[facility] Progress note   Nicholas Caldwell is a 74 y.o. male with medical history significant of CAD, A. fib/flutter on Eliquis, CHF, hypertension, hyperlipidemia, pulmonary hypertension, CKD 4, COPD on 3 to 4 L at home, diabetes, OSA on CPAP, prolonged QT who presents with 2 days of worsening shortness of breath.  Today's Vitals   10/30/20 1100 10/30/20 1125 10/30/20 1126 10/30/20 1200  BP:      Pulse:  85    Resp:      Temp:  98.3 F (36.8 C)    TempSrc:  Oral    SpO2:      Weight:   111.6 kg   PainSc: 4    4    Body mass index is 33.36 kg/m.   Principal Problem:   Hyperkalemia - K 7.5 - improved today to 4.7 today with treatment - cont to follow with diuresis  Active Problems:   Acute CHF (congestive heart failure), CKD 4  Acute respiratory failure - appreciate nephrology assistance- fluid overload in setting of CKD 4 - currently on Lasix 80 mg IV Q 8 hrs - Wean O2 as able     PAF (paroxysmal atrial fibrillation)  - cont Amiodarone and Eliquis    CAD - Non-obstructive by LHC1/16 - cont Lipitor, Eliquis    COPD (chronic obstructive pulmonary disease)    Chronic respiratory failure with hypoxia (HCC) - stable      Diabetes mellitus type 2 in obese (HCC)      Component Value Date/Time   HGBA1C 6.7 (H) 09/25/2020 2203    cont Insulin while in house   Debbe Odea, MD Password: [email or web address]

## 2020-10-30 NOTE — ED Notes (Addendum)
Admitting dr Orlin Hilding notified of critical potassium

## 2020-10-30 NOTE — ED Notes (Signed)
Sdu Breakfast Ordered

## 2020-10-30 NOTE — H&P (Signed)
History and Physical   Nicholas Caldwell GLO:[id number] DOB: 1945-11-28 DOA: 10/29/2020  PCP: [name], MD   Patient coming from: Home  Chief Complaint: Shortness of breath  HPI: Nicholas Caldwell is a 74 y.o. male with medical history significant of CAD, A. fib/flutter on Eliquis, CHF, hypertension, hyperlipidemia, pulmonary hypertension, CKD 4, COPD on 3 to 4 L at home, diabetes, OSA on CPAP, prolonged QT who presents with 2 days of worsening shortness of breath.  Of note, patient was recently admitted late November to early December with respiratory failure, heart failure and hypokalemia.  He was discharged to SNF but did not like it there and went home early.  He has not had home health yet and has not been able to get up very much.  He has had some of his medication change around it is unclear if that is contributing.  Wife states that today paramedic who comes out to help with medications did come out and straightened out some of the medication questions. He denies missing any of his medications.  As above, he reports having 2 days of worsening shortness of breath.  When the fire department arrived roughly called EMS he was saturating 80% on room air.  He was placed on oxygen and given nebulizers with improvement in sats on 10 L (increased from baseline 3 to 4 L at home).Patient denies fever, cough, chest pain, abdominal pain.  ED Course: Vitals in ED significant for blood pressure in the 188C to 166 systolic, respiratory rate in the 20s, pulse in the 40s to 60s.  Lab work-up showed BMP with sodium 131, potassium greater than 7.5, bicarb 18, BUN 30, creatinine 2.34 (which is stable).  Glucose was 187.  CBC showed stable hemoglobin at 10.2.  Troponin 34 with repeat pending.  BMP elevated at 2900.  Respiratory panel for flu and Covid negative.  Magnesium and phosphorus within in normal limits.  Chest x-ray showed edema and pulmonary congestion.  Patient received calcium, insulin, Lasix in ED. EKG with  peaked T waves, widened QRS, intermittent bradycardia. Nephrology was consulted upon my request who did not have additional recommendations per EDP.   Review of Systems: As per HPI otherwise all other systems reviewed and are negative.  Past Medical History:  Diagnosis Date  . Atrial flutter ([place])    a. recurrent AFlutter with RVR;  b. Amiodarone Rx started 4/16  . CAD (coronary artery disease)    a. LHC 1/16:  mLAD diffuse disease, pLCx mild disease, dLCx with disease but too small for PCI, RCA ok, EF 25-30%  . Chronic pain   . Chronic systolic CHF (congestive heart failure) ([place])   . COPD (chronic obstructive pulmonary disease) ([place])   . Diabetes mellitus without complication ([place])   . Ex-smoker 11/2017  . Hypercholesteremia   . Hypertension   . NICM (nonischemic cardiomyopathy) ([place])    a.dx 2016. b. 2D echo 06/2016 - Last echo 07/01/16: mod dilated LV, mod LVH, EF 25-30%, mild-mod MR, sev LAE, mild-mod reduced RV systolic function, mild-mod TR, PASP 27mHG.  .Marland KitchenPAF (paroxysmal atrial fibrillation) (HCC)    On amio - ot a candidate for flecainide due to cardiomyopathy, not a candidate for Tikosyn due to prolonged QT, and felt to be a poor candidate for ablation given left atrial size.  . Pulmonary hypertension ([place]   . Tobacco abuse     Past Surgical History:  Procedure Laterality Date  . BIOPSY  03/11/2019   Procedure: BIOPSY;  Surgeon: CGerrit Heck  V, DO;  Location: [place];  Service: Gastroenterology;;  . BIOPSY  03/15/2019   Procedure: BIOPSY;  Surgeon: Irving Copas., MD;  Location: [place];  Service: Gastroenterology;;  . CARDIOVERSION N/A 07/30/2018   Procedure: CARDIOVERSION;  Surgeon: Larey Dresser, MD;  Location[facility] ENDOSCOPY;  Service: Cardiovascular;  Laterality: N/A;  . COLONOSCOPY N/A 03/11/2019   Procedure: COLONOSCOPY;  Surgeon: Lavena Bullion, DO;  Location: [place];  Service: Gastroenterology;  Laterality: N/A;  . ENTEROSCOPY N/A  03/15/2019   Procedure: ENTEROSCOPY;  Surgeon: Rush Landmark Telford Nab., MD;  Location: [place];  Service: Gastroenterology;  Laterality: N/A;  . ESOPHAGOGASTRODUODENOSCOPY Left 03/11/2019   Procedure: ESOPHAGOGASTRODUODENOSCOPY (EGD);  Surgeon: Lavena Bullion, DO;  Location[facility] ENDOSCOPY;  Service: Gastroenterology;  Laterality: Left;  . LEFT AND RIGHT HEART CATHETERIZATION WITH CORONARY ANGIOGRAM N/A 11/06/2014   Procedure: LEFT AND RIGHT HEART CATHETERIZATION WITH CORONARY ANGIOGRAM;  Surgeon: Jettie Booze, MD;  Location: [facility] CATH LAB;  Service: Cardiovascular;  Laterality: N/A;  . RIGHT/LEFT HEART CATH AND CORONARY ANGIOGRAPHY N/A 12/06/2018   Procedure: RIGHT/LEFT HEART CATH AND CORONARY ANGIOGRAPHY;  Surgeon: Larey Dresser, MD;  Location: [place] CV LAB;  Service: Cardiovascular;  Laterality: N/A;  . SPLENECTOMY    . SUBMUCOSAL TATTOO INJECTION  03/15/2019   Procedure: SUBMUCOSAL TATTOO INJECTION;  Surgeon: Rush Landmark Telford Nab., MD;  Location: [place];  Service: Gastroenterology;;    Social History  reports that he quit smoking about 23 months ago. His smoking use included cigarettes. He has a 34.00 pack-year smoking history. He has never used smokeless tobacco. He reports that he does not drink alcohol and does not use drugs.  Allergies  Allergen Reactions  . Bidil [Isosorb Dinitrate-Hydralazine] Shortness Of Breath  . Benazepril Nausea And Vomiting  . Brimonidine     Other reaction(s): Other (See Comments)  . Metformin     Other reaction(s): Cramps (ALLERGY/intolerance), Other (See Comments)  . Morphine Rash    Family History  Problem Relation Age of Onset  . Heart disease Mother   . Hypertension Mother   . Heart failure Mother   . Heart disease Father   . Hypertension Sister   . Heart attack Neg Hx   . Stroke Neg Hx   . Colon cancer Neg Hx   . Esophageal cancer Neg Hx   Reviewed on admission  Prior to Admission medications   Medication Sig Start  Date End Date Taking? Authorizing Provider  acetaminophen (TYLENOL) 325 MG tablet Take 2 tablets (650 mg total) by mouth every 4 (four) hours as needed for headache or mild pain. 10/09/20   Geradine Girt, DO  acidophilus (RISAQUAD) CAPS capsule Take 2 capsules by mouth daily. Patient not taking: Reported on 10/29/2020 10/10/20   Eulogio Bear U, DO  albuterol (PROVENTIL) (2.5 MG/3ML) 0.083% nebulizer solution Take 3 mLs (2.5 mg total) by nebulization every 4 (four) hours as needed for wheezing or shortness of breath. 06/21/20   Tanda Rockers, MD  albuterol (VENTOLIN HFA) 108 (90 Base) MCG/ACT inhaler Inhale 2 puffs into the lungs every 6 (six) hours as needed for wheezing or shortness of breath.    [provider]  amiodarone (PACERONE) 200 MG tablet Take 0.5 tablets (100 mg total) by mouth daily. 08/28/20   Larey Dresser, MD  amoxicillin (AMOXIL) 500 MG capsule Take 500 mg by mouth 3 (three) times daily.    [provider]  amoxicillin-clavulanate (AUGMENTIN) 875-125 MG tablet Take 1 tablet by  mouth daily. Patient not taking: Reported on 10/29/2020 10/09/20   Geradine Girt, DO  apixaban (ELIQUIS) 5 MG TABS tablet Take 1 tablet (5 mg total) by mouth 2 (two) times daily. Patient taking differently: Take 2.5 mg by mouth 2 (two) times daily. 08/29/19   Hosie Poisson, MD  ascorbic acid (VITAMIN C) 500 MG tablet Take 500 mg by mouth 3 (three) times a week. Monday Wednesday Friday    [provider]  atorvastatin (LIPITOR) 40 MG tablet Take 1 tablet (40 mg total) by mouth daily. Patient not taking: Reported on 10/29/2020 10/10/20   Geradine Girt, DO  Budeson-Glycopyrrol-Formoterol (BREZTRI AEROSPHERE) 160-9-4.8 MCG/ACT AERO Inhale 2 puffs into the lungs 2 (two) times daily. 06/21/20   Tanda Rockers, MD  carbamide peroxide (DEBROX) 6.5 % OTIC solution Place 5-10 drops into both ears 3 (three) times daily.    [provider]  Carboxymethylcellulose Sod PF 0.25 %  SOLN Place 1 drop into both eyes 4 (four) times daily.    [provider]  cetirizine (ZYRTEC) 10 MG tablet Take 10 mg by mouth daily as needed for allergies.     [provider]  diazepam (VALIUM) 2 MG tablet Take 1 tablet (2 mg total) by mouth 2 (two) times daily. 10/09/20   Geradine Girt, DO  famotidine (PEPCID) 20 MG tablet TAKE 1 TABLET(20 MG) BY MOUTH TWICE DAILY 10/29/20   Larey Dresser, MD  fluticasone [facility]) 50 MCG/ACT nasal spray Place 2 sprays into both nostrils daily as needed for allergies.     [provider]  hydrocortisone (ANUSOL-HC) 2.5 % rectal cream Place 1 application rectally 2 (two) times daily as needed for hemorrhoids or itching.     [provider]  hydrocortisone 1 % lotion Apply 1 application topically See admin instructions. Apply small amount to back twice daily for itchy rash as needed    [provider]  insulin aspart (NOVOLOG) 100 UNIT/ML injection Inject 0-5 Units into the skin at bedtime. 10/09/20   Geradine Girt, DO  insulin aspart (NOVOLOG) 100 UNIT/ML injection Inject 0-15 Units into the skin 3 (three) times daily with meals. 10/09/20   Geradine Girt, DO  Insulin Syringe 27G X 1/2" 0.5 ML MISC 100 Syringes by Does not apply route 3 (three) times daily. 08/29/19   Hosie Poisson, MD  latanoprost (XALATAN) 0.005 % ophthalmic solution Place 1 drop into both eyes at bedtime as needed (dry eyes).     [provider]  lidocaine (LIDODERM) 5 % Place 1 patch onto the skin daily. Remove & Discard patch within 12 hours or as directed by MD 10/10/20   Geradine Girt, DO  OXYGEN Inhale 3 L into the lungs continuous.     [provider]  potassium chloride SA (KLOR-CON) 20 MEQ tablet Take 2 tablets (40 mEq total) by mouth 3 (three) times daily. 10/09/20   Geradine Girt, DO  PRESCRIPTION MEDICATION Cpap    [provider]  sodium chloride (OCEAN) 0.65 % SOLN nasal spray Place 2 sprays into both  nostrils as needed for congestion.     [provider]  tamsulosin (FLOMAX) 0.4 MG CAPS capsule Take 1 capsule (0.4 mg total) by mouth daily. Patient not taking: Reported on 10/29/2020 10/09/20   Geradine Girt, DO  torsemide (DEMADEX) 20 MG tablet Take 4 tablets (80 mg total) by mouth 2 (two) times daily. 11/18/19   Larey Dresser, MD  zinc oxide 20 %  ointment Apply 1 application topically 2 (two) times daily as needed for irritation (rash).     [provider]    Physical Exam: Vitals:   10/29/20 2343 10/29/20 2345 10/30/20 0000 10/30/20 0015  BP: (!) 150/73 (!) 156/131 131/73 (!) 176/108  Pulse: (!) 49 (!) 48 (!) 47 (!) 53  Resp: (!) 23 (!) 21 (!) 25 (!) 24  Temp:      TempSrc:      SpO2: 100% 100% 100% 100%   Physical Exam Constitutional:      General: He is not in acute distress.    Appearance: Normal appearance.  HENT:     Head: Normocephalic and atraumatic.     Mouth/Throat:     Mouth: Mucous membranes are moist.     Pharynx: Oropharynx is clear.  Eyes:     Extraocular Movements: Extraocular movements intact.     Pupils: Pupils are equal, round, and reactive to light.  Cardiovascular:     Rate and Rhythm: Bradycardia present. Rhythm irregular.     Pulses: Normal pulses.     Heart sounds: Normal heart sounds.  Pulmonary:     Effort: Pulmonary effort is normal. No respiratory distress.     Breath sounds: Rales present.  Abdominal:     General: Bowel sounds are normal. There is no distension.     Palpations: Abdomen is soft.     Tenderness: There is no abdominal tenderness.  Musculoskeletal:        General: No swelling or deformity.     Right lower leg: Edema (trace) present.     Left lower leg: Edema (trace) present.  Skin:    General: Skin is warm and dry.  Neurological:     General: No focal deficit present.     Mental Status: Mental status is at baseline.    Labs on Admission: I have personally reviewed following labs and imaging  studies  CBC: Recent Labs  Lab 10/29/20 2252  WBC 7.5  HGB 10.2*  HCT 34.8*  MCV 90.6  PLT 096    Basic Metabolic Panel: Recent Labs  Lab 10/29/20 2252 10/29/20 2309  NA 131*  --   K >7.5*  --   CL 105  --   CO2 18*  --   GLUCOSE 187*  --   BUN 30*  --   CREATININE 2.34*  --   CALCIUM 8.9  --   MG  --  1.9  PHOS  --  2.7    GFR: CrCl cannot be calculated (Unknown ideal weight.).  Liver Function Tests: No results for input(s): AST, ALT, ALKPHOS, BILITOT, PROT, ALBUMIN in the last 168 hours.  Urine analysis:    Component Value Date/Time   COLORURINE STRAW (A) 07/08/2019 0008   APPEARANCEUR CLEAR 07/08/2019 0008   LABSPEC 1.006 07/08/2019 0008   PHURINE 6.0 07/08/2019 0008   GLUCOSEU NEGATIVE 07/08/2019 0008   HGBUR NEGATIVE 07/08/2019 0008   BILIRUBINUR NEGATIVE 07/08/2019 0008   KETONESUR NEGATIVE 07/08/2019 0008   PROTEINUR NEGATIVE 07/08/2019 0008   UROBILINOGEN 0.2 10/26/2014 2206   NITRITE NEGATIVE 07/08/2019 0008   LEUKOCYTESUR NEGATIVE 07/08/2019 0008    Radiological Exams on Admission: DG Chest Portable 1 View  Result Date: 10/29/2020 CLINICAL DATA:  Shortness of breath EXAM: PORTABLE CHEST 1 VIEW COMPARISON:  10/05/2020 CT, chest x-ray 09/25/2020, 06/21/2020, 07/07/2019 FINDINGS: Cardiomegaly with vascular congestion and mild edema. Small pleural effusions. Persistent airspace disease at the left lung base. Aortic atherosclerosis. IMPRESSION: Cardiomegaly with vascular  congestion, mild edema and small pleural effusions. Persistent airspace disease at the left base, some of which appears chronic. Electronically Signed   By: Donavan Foil M.D.   On: 10/29/2020 23:01    EKG: Independently reviewed.  Atrial fibrillation, peaked T waves, widened QRS.  Compared to previous peaked T waves and widened QRS are new.  Findings concerning in the setting of elevated potassium.  Assessment/Plan Principal Problem:   Hyperkalemia Active Problems:   Essential  hypertension   PAF (paroxysmal atrial fibrillation) (HCC)   CAD - Non-obstructive by LHC1/16   COPD (chronic obstructive pulmonary disease) (HCC)   Acute on chronic respiratory failure with hypoxia (HCC)   Chronic respiratory failure with hypoxia (HCC)   Diabetes mellitus type 2 in obese (HCC)   Acute CHF (congestive heart failure) (HCC)  Hyperkalemia > Potassium greater than 7.5.  Significant EKG findings with peaked T waves and widened QRS. > Has been given calcium, Lasix, insulin in ED > Nephrology consulted > Magnesium normal at 1.9 > Possibly due to missing diuretic and taking too much potassium supplementation - Appreciate nephrology recommendations - Add on albuterol nebulization - Recheck potassium now and every 2 hours until normal x2 - Start Lokelma - Continue Lasix 80 mg IV twice daily - Repeat EKG now and in a.m.  Pulmonary hypertension Acute heart failure exacerbation > Volume overloaded on exam and on chest x-ray > Missed his dose of diuretic today > Last echo was last month with EF 40-34%, diastolic function not evaluated, RV severely reduced. - May need cardiology consult in the morning considering severe right heart failure - Lasix 80 mg twice daily - Strict I/O, daily weight - Trend renal function and electrolytes  Acute on chronic hypoxic respiratory failure > Requires 3 to 4 L at home, now requiring 10 L to maintain saturations in the setting of volume overload causing CHF exacerbation on top of his COPD, OSA, pulmonary hypertension. - Continue treatments for CHF as above - Continue treatments for COPD and OSA as below - Supplemental oxygen as needed  History of prolonged QT > QTC on EKG 488 is improved from previous - Avoid QTC prolonging medications  CKD 4 > Creatinine stable at 2.34 > Hyperkalemia as above > Long-term he is not a candidate for dialysis due to severe right heart failure - Appreciate nephrology recommendations - Lasix as above -  Avoid nephrotoxic agents - Trend renal function and electrolytes  OSA COPD Pulmonary hypertension > On chronic 3 to 4 L of home oxygen, requiring 10 L in the setting of volume overload as above. - Continue home CPAP - Continue home Breztri and PRN albuterol  Atrial fibrillation/flutter > Rate slowing in ED possibly related to severe hyperkalemia - Continue low-dose Eliquis 2.5 mg twice daily - Continue amiodarone  CAD > Nonobstructive on cath in 2020  Diabetes - SSI  DVT prophylaxis: Eliquis  Code Status:   Full  Family Communication:  Wife Updated by phone Disposition Plan:   Patient is from:  Home  Anticipated DC to:  Pending clinical course  Anticipated DC date:  Pending clinical course  Anticipated DC barriers: None  Consults called:  Nephrology consulted by EDP  Admission status:  Inpatient, progressive   Severity of Illness: The appropriate patient status for this patient is INPATIENT. Inpatient status is judged to be reasonable and necessary in order to provide the required intensity of service to ensure the patient's safety. The patient's presenting symptoms, physical exam findings, and initial radiographic  and laboratory data in the context of their chronic comorbidities is felt to place them at high risk for further clinical deterioration. Furthermore, it is not anticipated that the patient will be medically stable for discharge from the hospital within 2 midnights of admission. The following factors support the patient status of inpatient.   " The patient's presenting symptoms include shortness of breath. " The worrisome physical exam findings include irregular rhythm with intermittent bradycardia, rales. " The initial radiographic and laboratory data are worrisome because of severe hyperkalemia greater than 7.5.  Creatinine 7.34.  EKG with peaked T waves and significantly widened QRS. " The chronic co-morbidities include CAD, atrial flutter, CHF, hypertension,  hyperlipidemia, pulmonary hypertension, CKD, COPD, diabetes, OSA, prolonged QT.   * I certify that at the point of admission it is my clinical judgment that the patient will require inpatient hospital care spanning beyond 2 midnights from the point of admission due to high intensity of service, high risk for further deterioration and high frequency of surveillance required.Marcelyn Bruins MD [facility]  How to contact the [facility] Attending or Consulting provider [place] or covering provider during after hours [place], for this patient?   1. Check the care team in [facility] and look for a) attending/consulting TR[facility] provider listed and b) the [facility] team listed 2. Log into [email or web address] and use [place]'s universal password to access. If you do not have the password, please contact the hospital operator. 3. Locate the [facility] provider you are looking for under [facility] and page to a number that you can be directly reached. 4. If you still have difficulty reaching the provider, please page the [facility] (Director on Call) for the Hospitalists listed on amion for assistance.  10/30/2020, 1:10 AM

## 2020-10-30 NOTE — ED Notes (Signed)
Breakfast Tray Ordered @ [id number].

## 2020-10-30 NOTE — Progress Notes (Signed)
Brief nephrology follow-up:  The full consult was done earlier today. Patient was seen and examined at bedside.  He is comfortable, sitting on bed.  He has lower extremity edema.  The repeat lab showed improvement of potassium level of 6, CO2 20, BUN 29 and creatinine level 2.81.  I agree with IV Lasix (lower dose to 80 mg q8hr IV), Lokelma. Monitor lab closely.  No need for dialysis.  We will follow with you.  Nicholas Shalamar, MD [facility].

## 2020-10-31 DIAGNOSIS — E875 Hyperkalemia: Secondary | ICD-10-CM | POA: Diagnosis not present

## 2020-10-31 LAB — GLUCOSE, CAPILLARY
Glucose-Capillary: 105 mg/dL — ABNORMAL HIGH (ref 70–99)
Glucose-Capillary: 92 mg/dL (ref 70–99)
Glucose-Capillary: 95 mg/dL (ref 70–99)
Glucose-Capillary: 125 mg/dL — ABNORMAL HIGH (ref 70–99)

## 2020-10-31 LAB — RENAL FUNCTION PANEL
Albumin: 2.4 g/dL — ABNORMAL LOW (ref 3.5–5.0)
Anion gap: 11 (ref 5–15)
BUN: 30 mg/dL — ABNORMAL HIGH (ref 8–23)
CO2: 24 mmol/L (ref 22–32)
Calcium: 8.7 mg/dL — ABNORMAL LOW (ref 8.9–10.3)
Chloride: 101 mmol/L (ref 98–111)
Creatinine, Ser: 2.17 mg/dL — ABNORMAL HIGH (ref 0.61–1.24)
GFR, Estimated: 32 mL/min — ABNORMAL LOW (ref >60)
Glucose, Bld: 96 mg/dL (ref 70–99)
Phosphorus: 4.6 mg/dL (ref 2.5–4.6)
Potassium: 3.6 mmol/L (ref 3.5–5.1)
Sodium: 136 mmol/L (ref 135–145)

## 2020-10-31 MED ORDER — TORSEMIDE 20 MG PO TABS
80.0000 mg | ORAL_TABLET | Freq: Two times a day (BID) | ORAL | Status: DC
  Filled 2020-10-31: qty 4

## 2020-10-31 MED ORDER — OXYCODONE-ACETAMINOPHEN 5-325 MG PO TABS
1.0000 | ORAL_TABLET | Freq: Four times a day (QID) | ORAL | Status: DC | PRN
  Administered 2020-10-31 – 2020-11-01 (×3): 1 via ORAL
  Filled 2020-10-31 (×3): qty 1

## 2020-10-31 MED ORDER — OXYCODONE HCL 5 MG PO TABS
5.0000 mg | ORAL_TABLET | Freq: Four times a day (QID) | ORAL | Status: DC | PRN
  Administered 2020-10-31 – 2020-11-01 (×5): 5 mg via ORAL
  Filled 2020-10-31 (×5): qty 1

## 2020-10-31 MED ORDER — SILVER SULFADIAZINE 1 % EX CREA
TOPICAL_CREAM | Freq: Every day | CUTANEOUS | Status: DC
  Filled 2020-10-31: qty 85

## 2020-10-31 MED ORDER — TORSEMIDE 20 MG PO TABS
80.0000 mg | ORAL_TABLET | Freq: Two times a day (BID) | ORAL | Status: DC
Start: 2020-10-31 — End: 2020-11-01
  Administered 2020-10-31 – 2020-11-01 (×2): 80 mg via ORAL
  Filled 2020-10-31 (×3): qty 4

## 2020-10-31 MED ORDER — OXYCODONE-ACETAMINOPHEN 10-325 MG PO TABS: 1.0000 | ORAL_TABLET | Freq: Four times a day (QID) | ORAL | Status: DC | PRN

## 2020-10-31 MED ORDER — SENNOSIDES-DOCUSATE SODIUM 8.6-50 MG PO TABS
2.0000 | ORAL_TABLET | Freq: Every day | ORAL | Status: DC
  Administered 2020-10-31: 2 via ORAL
  Filled 2020-10-31: qty 2

## 2020-10-31 MED ORDER — OXYCODONE HCL 5 MG PO TABS: 5.0000 mg | ORAL_TABLET | Freq: Four times a day (QID) | ORAL | Status: DC | PRN

## 2020-10-31 MED ORDER — OXYCODONE-ACETAMINOPHEN 5-325 MG PO TABS
1.0000 | ORAL_TABLET | Freq: Four times a day (QID) | ORAL | Status: DC | PRN
  Administered 2020-10-31: 1 via ORAL
  Filled 2020-10-31: qty 1

## 2020-10-31 MED ORDER — POTASSIUM CHLORIDE CRYS ER 20 MEQ PO TBCR
20.0000 meq | EXTENDED_RELEASE_TABLET | Freq: Every day | ORAL | Status: DC
  Administered 2020-10-31 – 2020-11-01 (×2): 20 meq via ORAL
  Filled 2020-10-31 (×2): qty 1

## 2020-10-31 NOTE — TOC Progression Note (Signed)
Transition of Care [facility]) - Progression Note    Patient Details  Name: Nicholas Caldwell MRN: [id number] Date of Birth: 28-May-1946  Transition of Care [facility]) CM/SW [place], RN Phone Number: [phone]  10/31/2020, 3:45 PM  Clinical Narrative:    TOC following for PT recommendations. [facility]ed [facility] to determine [facility] and initiate D[facility] request. Message has been left for April @ [phone] ext [phone]. Will await return call.        Expected Discharge Plan and Services                                                 Social Determinants of Health (SDOH) Interventions    Readmission Risk Interventions Readmission Risk Prevention Plan 08/15/2019 06/13/2019 04/14/2019  Transportation Screening Complete Complete Complete  Medication Review Press photographer) Not Complete Complete Complete  Med Review Comments requested discharge medication reconciliation by attending - -  PCP or Specialist appointment within 3-5 days of discharge Not Complete Complete Complete  PCP/Specialist Appt Not Complete comments [facility] informed of admit and will contact pt directly regarding follow up appt - 6/17 with cardiology  [place] or Home Care Consult Complete Complete Complete  SW Recovery Care/Counseling Consult - Complete Complete  Palliative Care Screening - Not Applicable Not [place] - Not Applicable Not Applicable  Some recent data might be hidden

## 2020-10-31 NOTE — Evaluation (Signed)
Physical Therapy Evaluation Patient Details Name: Nicholas Caldwell MRN: [id number] DOB: 02/27/1946 Today's Date: 10/31/2020   History of Present Illness  Pt is a 74 y/o male admitted secondary to hyperkalemia and CHF exacerbation. PMH includes HTN, a fib, CAD, COPD, DM, CHF, CKD, and ischemic cardiomyopathy.  Clinical Impression  Pt admitted secondary to problem above with deficits below. Pt initially very agitated with PT, however, was eventually agreeable to performing bed mobility tasks. Pt requiring mod A to sit at EOB, but refusing to stand. Pt with dizziness in sitting and BP at 99/58 mmHg. Pt returned to supine and put bed in chair position; BP at 125/60 mmHg. Feel pt would benefit from SNF level therapies as he has stayed in the bed since previous discharge from SNF, however, pt refusing and wants to go home. Will need a hoyer lift for transfers and PTAR transport home. Will continue to follow acutely.     Follow Up Recommendations SNF;Supervision/Assistance - 24 hour (pt will refuse; will need HHPT/HHOT/HHaide/HHRN)    Equipment Recommendations  Other (comment) (hoyer lift and pad; needs PTAR transport)    Recommendations for Other Services       Precautions / Restrictions Precautions Precautions: Fall Restrictions Weight Bearing Restrictions: No      Mobility  Bed Mobility Overal bed mobility: Needs Assistance Bed Mobility: Supine to Sit;Sit to Supine;Rolling Rolling: Supervision   Supine to sit: Mod assist Sit to supine: Mod assist   General bed mobility comments: Supervision to roll from side to side using bed rails. Was eventually agreeable to sitting EOB and required mod A for trunk and LE assist. BP at 99/58 and pt reporting dizziness in sitting, so returned to supine. positioned bed in chair position.    Transfers                    Ambulation/Gait                Stairs            Wheelchair Mobility    Modified Rankin (Stroke Patients  Only)       Balance Overall balance assessment: Needs assistance Sitting-balance support: No upper extremity supported;Feet supported Sitting balance-Leahy Scale: Fair                                       Pertinent Vitals/Pain Pain Assessment: No/denies pain    Home Living Family/patient expects to be discharged to:: Private residence Living Arrangements: Spouse/significant other Available Help at Discharge: Family;Available 24 hours/day Type of Home: House Home Access: Stairs to enter Entrance Stairs-Rails: None Entrance Stairs-Number of Steps: 1 Home Layout: One level Home Equipment: Walker - 2 wheels;Wheelchair - manual;Bedside commode;Grab bars - tub/shower;Shower seat;Electric scooter;Walker - 4 wheels      Prior Function Level of Independence: Needs assistance   Gait / Transfers Assistance Needed: Has been staying in the bed since getting home from SNF. Sits on EOB with assist from wife.  ADL's / Homemaking Assistance Needed: Spouse assists with ADLs and IADLs.        Hand Dominance        Extremity/Trunk Assessment   Upper Extremity Assessment Upper Extremity Assessment: Generalized weakness    Lower Extremity Assessment Lower Extremity Assessment: Generalized weakness    Cervical / Trunk Assessment Cervical / Trunk Assessment: Kyphotic  Communication   Communication: No difficulties  Cognition Arousal/Alertness: Awake/alert Behavior During  Therapy: Flat affect;Agitated Overall Cognitive Status: History of cognitive impairments - at baseline                                 General Comments: Pt very agitated initially and not responding to questions.      General Comments General comments (skin integrity, edema, etc.): Educated about need for hoyer lift at home.    Exercises     Assessment/Plan    PT Assessment Patient needs continued PT services  PT Problem List Decreased strength;Decreased mobility;Decreased  safety awareness;Decreased range of motion;Decreased knowledge of precautions;Decreased activity tolerance;Cardiopulmonary status limiting activity;Decreased balance;Decreased knowledge of use of DME       PT Treatment Interventions DME instruction;Therapeutic activities;Gait training;Patient/family education;Therapeutic exercise;Balance training;Functional mobility training    PT Goals (Current goals can be found in the Care Plan section)  Acute Rehab PT Goals Patient Stated Goal: to go home today PT Goal Formulation: With patient/family Time For Goal Achievement: 11/14/20 Potential to Achieve Goals: Fair    Frequency Min 2X/week   Barriers to discharge        Co-evaluation               AM-PAC PT "6 Clicks" Mobility  Outcome Measure Help needed turning from your back to your side while in a flat bed without using bedrails?: None Help needed moving from lying on your back to sitting on the side of a flat bed without using bedrails?: A Lot Help needed moving to and from a bed to a chair (including a wheelchair)?: Total Help needed standing up from a chair using your arms (e.g., wheelchair or bedside chair)?: Total Help needed to walk in hospital room?: Total Help needed climbing 3-5 steps with a railing? : Total 6 Click Score: 10    End of Session Equipment Utilized During Treatment: Oxygen Activity Tolerance: Treatment limited secondary to agitation Patient left: in bed;with call bell/phone within reach;with bed alarm set;with family/visitor present Nurse Communication: Mobility status PT Visit Diagnosis: Unsteadiness on feet (R26.81);Muscle weakness (generalized) (M62.81);Difficulty in walking, not elsewhere classified (R26.2)    Time: 3009-2330 PT Time Calculation (min) (ACUTE ONLY): 23 min   Charges:   PT Evaluation $PT Eval Moderate Complexity: 1 Mod PT Treatments $Therapeutic Activity: 8-22 mins        Lou Miner, DPT  Acute Rehabilitation Services   Pager: [phone] Office: [phone]   Rudean Hitt 10/31/2020, 1:07 PM

## 2020-10-31 NOTE — Progress Notes (Addendum)
PROGRESS NOTE  Nicholas Caldwell UDJ:[id number] DOB: [birth year] DOA: 10/29/2020 PCP: [name], MD  HPI/Recap of past 24 hours: Nicholas Caldwell is a 74 y.o. male with medical history significant of CAD, A. fib/flutter on Eliquis, CHF, hypertension, hyperlipidemia, pulmonary hypertension, CKD 4, COPD on 3 to 4 L at home, diabetes, OSA on CPAP, prolonged QT who presents with 2 days of worsening shortness of breath.             Of note, patient was recently admitted late November to early December with respiratory failure, heart failure and hypokalemia.  He was discharged to SNF but did not like it there and went home early.  He has not had home health yet and has not been able to get up very much.  He has had some of his medication change around it is unclear if that is contributing.  Wife states that today paramedic who comes out to help with medications did come out and straightened out some of the medication questions. He denies missing any of his medications.             As above, he reports having 2 days of worsening shortness of breath.  When the fire department arrived roughly called EMS he was saturating 80% on room air.  He was placed on oxygen and given nebulizers with improvement in sats on 10 L (increased from baseline 3 to 4 L at home).Patient denies fever, cough, chest pain, abdominal pain.  ED Course: Vitals in ED significant for blood pressure in the 588F to 027 systolic, respiratory rate in the 20s, pulse in the 40s to 60s.  Lab work-up showed BMP with sodium 131, potassium greater than 7.5, bicarb 18, BUN 30, creatinine 2.34 (which is stable).  Glucose was 187.  CBC showed stable hemoglobin at 10.2.  Troponin 34 with repeat pending.  BMP elevated at 2900.  Respiratory panel for flu and Covid negative.  Magnesium and phosphorus within in normal limits.  Chest x-ray showed edema and pulmonary congestion.  Patient received calcium, insulin, Lasix in ED. EKG with peaked T waves, widened QRS,  intermittent bradycardia. Nephrology was consulted upon my request who did not have additional recommendations per EDP.   10/31/20: Patient was seen and examined with his wife at his bedside.  He reports significant back pain which is chronic for him.  Restarted on home dose of Percocet.  He has a sacral decubitus ulcer, POA.  Wound care specialist consulted to assist with management.  Evaluated by PT who recommended SNF however patient prefers to go home with home health services.  TOC consulted to assist with home health services arrangement.  Assessment/Plan: Principal Problem:   Hyperkalemia Active Problems:   Essential hypertension   PAF (paroxysmal atrial fibrillation) (HCC)   CAD - Non-obstructive by LHC1/16   COPD (chronic obstructive pulmonary disease) (HCC)   Acute on chronic respiratory failure with hypoxia (HCC)   Chronic respiratory failure with hypoxia (HCC)   Diabetes mellitus type 2 in obese (HCC)   Acute CHF (congestive heart failure) (HCC)  Resolved severe hyperkalemia with EKG changes likely secondary to potassium supplement and renal insufficiency in the setting of CKD 4 Presented with serum potassium 7.5, treated with calcium gluconate, Lokelma and Lasix Serum potassium has normalized down to 3.6. DC Lokelma. He has a history of hypokalemia on chronic diuretics, he was on potassium supplement. Was seen by nephrology, recommended to start on smaller dose of oral potassium supplement as outpatient and to repeat  BMP within a week to monitor potassium level after discharge from the hospital.  Acute on chronic diastolic CHF with severe right ventricular failure Received IV diuretics with net I&O -5.5 L Restarted home torsemide 80 mg twice daily, added small dose of potassium, 20 mEq daily. He follows with cardiology outpatient Will need close follow-up with cardiology after discharge. Last 2D echo was done on 09/26/2020 showing LVEF 50 to 55% with estimated right  ventricular systolic pressure of 68 mmhg, right atrial size was severely dilated, tricuspid valve regurgitation is moderate to severe.  Severely dilated pulmonary artery.  CKD stage IV He appears to be back to his baseline creatinine Avoid nephrotoxins Follows with Dr. Marval Regal outpatient  Resolved acute on chronic hypoxic aspiratory failure At baseline requires 3 to 4 L On admission was requiring 10 L, currently on 4 L with O2 saturation of 99%.  Chronic A. Fib Currently rate controlled Continue home regimen Of amiodarone for rhythm control On Eliquis for CVA prevention  OSA Continue home CPAP  Nonobstructive coronary artery disease Cath in 2020 Denies any anginal symptoms Continue home regimen  Type 2 diabetes Obtain hemoglobin A1c Continue insulin sliding scale  Sacral pressure injury, POA Wound care specialist consulted Continue local wound care Appreciate wound care specialist assistance  Chronic back pain Resume home regimen Bowel regimen added   Code Status: Full code  Family Communication: Updated his wife at bedside.  Disposition Plan: Home with home health services PT/OT/RN/home health aide   Consultants:  Nephrology  Procedures:  None.  Antimicrobials:  None  DVT prophylaxis: Eliquis.  Status is: Inpatient    Dispo: The patient is from: Home.              Anticipated d/c is to: Home with home health services.               Anticipated d/c date is: 11/01/2020.              Patient currently not ready for DC due to ongoing management of acute on chronic diastolic CHF, ongoing diuresing with repletion of electrolytes.        Objective: Vitals:   10/31/20 0500 10/31/20 0711 10/31/20 0724 10/31/20 1148  BP:  (!) 103/41  136/60  Pulse:  89 94   Resp:  (!) 27 19   Temp:  98.9 F (37.2 C)  98.5 F (36.9 C)  TempSrc:  Oral  Oral  SpO2:  94% 100%   Weight: 106.3 kg       Intake/Output Summary (Last 24 hours) at 10/31/2020  1602 Last data filed at 10/31/2020 1100 Gross per 24 hour  Intake 360 ml  Output 3700 ml  Net -3340 ml   Filed Weights   10/30/20 1000 10/30/20 1126 10/31/20 0500  Weight: 111.6 kg 111.6 kg 106.3 kg    Exam:  . General: 74 y.o. year-old male well developed well nourished in no acute distress.  Alert and oriented x3. . Cardiovascular: Irregular rate and rhythm with no rubs or gallops.  No thyromegaly or JVD noted.   Marland Kitchen Respiratory: Mild rales at bases no wheezing noted.  Good respiratory effort.   . Abdomen: Soft nontender nondistended with normal bowel sounds x4 quadrants. . Musculoskeletal: No lower extremity edema bilaterally. Marland Kitchen Psychiatry: Mood is appropriate for condition and setting Skin:     Data Reviewed: CBC: Recent Labs  Lab 10/29/20 2252 10/30/20 0133  WBC 7.5 7.1  HGB 10.2* 10.4*  HCT 34.8* 37.1*  MCV 90.6  92.3  PLT 381 782   Basic Metabolic Panel: Recent Labs  Lab 10/29/20 2252 10/29/20 2309 10/30/20 0133 10/30/20 0326 10/30/20 0834 10/30/20 1046 10/30/20 1258 10/30/20 1500 10/31/20 0318  NA 131*  --  130*  --  135  --   --   --  136  K >7.5*  --  >7.5*   < > 6.0* 4.7 4.7 4.5 3.6  CL 105  --  107  --  105  --   --   --  101  CO2 18*  --  17*  --  20*  --   --   --  24  GLUCOSE 187*  --  180*  --  89  --   --   --  96  BUN 30*  --  29*  --  29*  --   --   --  30*  CREATININE 2.34*  --  2.39*  --  2.31*  --   --   --  2.17*  CALCIUM 8.9  --  8.8*  --  9.8  --   --   --  8.7*  MG  --  1.9  --   --   --   --   --   --   --   PHOS  --  2.7  --   --  3.7  --   --   --  4.6   < > = values in this interval not displayed.   GFR: Estimated Creatinine Clearance: 38.8 mL/min (A) (by C-G formula based on SCr of 2.17 mg/dL (H)). Liver Function Tests: Recent Labs  Lab 10/30/20 0133 10/30/20 0834 10/31/20 0318  AST 25  --   --   ALT 19  --   --   ALKPHOS 134*  --   --   BILITOT 0.5  --   --   PROT 6.7  --   --   ALBUMIN 2.9* 2.9* 2.4*   No  results for input(s): LIPASE, AMYLASE in the last 168 hours. No results for input(s): AMMONIA in the last 168 hours. Coagulation Profile: No results for input(s): INR, PROTIME in the last 168 hours. Cardiac Enzymes: No results for input(s): CKTOTAL, CKMB, CKMBINDEX, TROPONINI in the last 168 hours. BNP (last 3 results) No results for input(s): PROBNP in the last 8760 hours. HbA1C: No results for input(s): HGBA1C in the last 72 hours. CBG: Recent Labs  Lab 10/30/20 1221 10/30/20 1621 10/30/20 2209 10/31/20 0706 10/31/20 1146  GLUCAP 128* 116* 90 105* 92   Lipid Profile: No results for input(s): CHOL, HDL, LDLCALC, TRIG, CHOLHDL, LDLDIRECT in the last 72 hours. Thyroid Function Tests: No results for input(s): TSH, T4TOTAL, FREET4, T3FREE, THYROIDAB in the last 72 hours. Anemia Panel: No results for input(s): VITAMINB12, FOLATE, FERRITIN, TIBC, IRON, RETICCTPCT in the last 72 hours. Urine analysis:    Component Value Date/Time   COLORURINE STRAW (A) 07/08/2019 0008   APPEARANCEUR CLEAR 07/08/2019 0008   LABSPEC 1.006 07/08/2019 0008   PHURINE 6.0 07/08/2019 0008   GLUCOSEU NEGATIVE 07/08/2019 0008   HGBUR NEGATIVE 07/08/2019 0008   BILIRUBINUR NEGATIVE 07/08/2019 0008   KETONESUR NEGATIVE 07/08/2019 0008   PROTEINUR NEGATIVE 07/08/2019 0008   UROBILINOGEN 0.2 10/26/2014 2206   NITRITE NEGATIVE 07/08/2019 0008   LEUKOCYTESUR NEGATIVE 07/08/2019 0008   Sepsis Labs: _0 (procalcitonin:4,lacticidven:4)  ) Recent Results (from the past 240 hour(s))  Resp Panel by RT-PCR (Flu A&B, Covid) Nasopharyngeal Swab     Status:  None   Collection Time: 10/29/20 10:46 PM   Specimen: Nasopharyngeal Swab; Nasopharyngeal(NP) swabs in vial transport medium  Result Value Ref Range Status   SARS Coronavirus 2 by RT PCR NEGATIVE NEGATIVE Final    Comment: (NOTE) SARS-CoV-2 target nucleic acids are NOT DETECTED.  The SARS-CoV-2 RNA is generally detectable in upper  respiratory specimens during the acute phase of infection. The lowest concentration of SARS-CoV-2 viral copies this assay can detect is 138 copies/mL. A negative result does not preclude SARS-Cov-2 infection and should not be used as the sole basis for treatment or other patient management decisions. A negative result may occur with  improper specimen collection/handling, submission of specimen other than nasopharyngeal swab, presence of viral mutation(s) within the areas targeted by this assay, and inadequate number of viral copies(<138 copies/mL). A negative result must be combined with clinical observations, patient history, and epidemiological information. The expected result is Negative.  Fact Sheet for Patients:  EntrepreneurPulse.com.au  Fact Sheet for Healthcare Providers:  IncredibleEmployment.be  This test is no t yet approved or cleared by the [place] FDA and  has been authorized for detection and/or diagnosis of SARS-CoV-2 by FDA under an Emergency Use Authorization (EUA). This EUA will remain  in effect (meaning this test can be used) for the duration of the COVID-19 declaration under Section 564(b)(1) of the Act, 21 U.S.C.section 360bbb-3(b)(1), unless the authorization is terminated  or revoked sooner.       Influenza A by PCR NEGATIVE NEGATIVE Final   Influenza B by PCR NEGATIVE NEGATIVE Final    Comment: (NOTE) The Xpert Xpress SARS-CoV-2/FLU/RSV plus assay is intended as an aid in the diagnosis of influenza from Nasopharyngeal swab specimens and should not be used as a sole basis for treatment. Nasal washings and aspirates are unacceptable for Xpert Xpress SARS-CoV-2/FLU/RSV testing.  Fact Sheet for Patients: EntrepreneurPulse.com.au  Fact Sheet for Healthcare Providers: IncredibleEmployment.be  This test is not yet approved or cleared by the [place] FDA and has been  authorized for detection and/or diagnosis of SARS-CoV-2 by FDA under an Emergency Use Authorization (EUA). This EUA will remain in effect (meaning this test can be used) for the duration of the COVID-19 declaration under Section 564(b)(1) of the Act, 21 U.S.C. section 360bbb-3(b)(1), unless the authorization is terminated or revoked.  Performed at [facility], [place] [address] [place], [place] [address]       Studies: No results found.  Scheduled Meds: . amiodarone  100 mg Oral Daily  . apixaban  2.5 mg Oral BID  . arformoterol  15 mcg Nebulization BID  . atorvastatin  40 mg Oral Daily  . budesonide (PULMICORT) nebulizer solution  0.25 mg Nebulization BID  . diclofenac Sodium  4 g Topical QID  . famotidine  20 mg Oral Daily  . insulin aspart  0-9 Units Subcutaneous TID WC  . senna-docusate  2 tablet Oral QHS  . silver sulfADIAZINE   Topical Daily  . sodium chloride flush  3 mL Intravenous Q12H  . torsemide  80 mg Oral BID  . umeclidinium bromide  1 puff Inhalation Daily    Continuous Infusions:   LOS: 1 day     Kayleen Memos, MD [facility] Pager [phone]  If 7PM-7AM, please contact night-coverage [email or web address] Password [email or web address] 10/31/2020, 4:02 PM

## 2020-10-31 NOTE — Progress Notes (Signed)
[facility] NEPHROLOGY PROGRESS NOTE  Assessment/ Plan: Pt is a 74 y.o. yo male  with extensive PMH including severe chronic systolic heart failure with RV failure, CKD4, COPD, OHS/OSA on chronic supplemental oxygen/CPAP, ischemic cardiomyopathy and recent admission 11/23 through 10/09/2020 for decompensated heart failure/respiratory failure presented with dyspnea, fluid overload, potassium >7.5.  #Severe hyperkalemia with EKG changes/bradycardia: History of hypokalemia due to diuretics and on oral potassium chloride at home.  He received medical treatment of hyperkalemia including IV Lasix.  Potassium level improved now. Given chronic use of diuretics, I think he will need to be on smaller dose of oral potassium supplement as outpatient. I recommend to check lab within a week to monitor potassium level after discharge from the hospital.  #CKD stage IV: Creatinine level is better than his baseline.  Volume status significantly improved.  I will discontinue IV Lasix and switching to his home dose of oral torsemide 80 mg twice daily.  He will need to see Dr. Marval Regal after discharge from the hospital.  #Severe systolic CHF, RV failure: Volume status optimized with IV diuretics.  Switching to oral torsemide as discussed above.  Follows with cardiologist  #Chronic respiratory failure, OSA, COPD, on home oxygen: Improved to his baseline.  Sign off, please call back with question.  Subjective: Seen and examined at bedside.  Feeling much better.  Denies nausea vomiting chest pain shortness of breath.  Oxygen requirement at baseline.  Urine output 4.4 L in 24 hours. Objective Vital signs in last 24 hours: Vitals:   10/31/20 0300 10/31/20 0500 10/31/20 0711 10/31/20 0724  BP: (!) 101/52  (!) 103/41   Pulse: 80  89 94  Resp: 20  (!) 27 19  Temp: 98 F (36.7 C)  98.9 F (37.2 C)   TempSrc: Axillary  Oral   SpO2: 94%  94% 100%  Weight:  106.3 kg     Weight change:    Intake/Output Summary (Last 24 hours) at 10/31/2020 0911 Last data filed at 10/31/2020 0541 Gross per 24 hour  Intake 3 ml  Output 3500 ml  Net -3497 ml       Labs: Basic Metabolic Panel: Recent Labs  Lab 10/29/20 2309 10/30/20 0133 10/30/20 0326 10/30/20 0834 10/30/20 1046 10/30/20 1258 10/30/20 1500 10/31/20 0318  NA  --  130*  --  135  --   --   --  136  K  --  >7.5*   < > 6.0*   < > 4.7 4.5 3.6  CL  --  107  --  105  --   --   --  101  CO2  --  17*  --  20*  --   --   --  24  GLUCOSE  --  180*  --  89  --   --   --  96  BUN  --  29*  --  29*  --   --   --  30*  CREATININE  --  2.39*  --  2.31*  --   --   --  2.17*  CALCIUM  --  8.8*  --  9.8  --   --   --  8.7*  PHOS 2.7  --   --  3.7  --   --   --  4.6   < > = values in this interval not displayed.   Liver Function Tests: Recent Labs  Lab 10/30/20 0133 10/30/20 0834 10/31/20 0318  AST 25  --   --  ALT 19  --   --   ALKPHOS 134*  --   --   BILITOT 0.5  --   --   PROT 6.7  --   --   ALBUMIN 2.9* 2.9* 2.4*   No results for input(s): LIPASE, AMYLASE in the last 168 hours. No results for input(s): AMMONIA in the last 168 hours. CBC: Recent Labs  Lab 10/29/20 2252 10/30/20 0133  WBC 7.5 7.1  HGB 10.2* 10.4*  HCT 34.8* 37.1*  MCV 90.6 92.3  PLT 381 358   Cardiac Enzymes: No results for input(s): CKTOTAL, CKMB, CKMBINDEX, TROPONINI in the last 168 hours. CBG: Recent Labs  Lab 10/30/20 0733 10/30/20 1221 10/30/20 1621 10/30/20 2209 10/31/20 0706  GLUCAP 96 128* 116* 90 105*    Iron Studies: No results for input(s): IRON, TIBC, TRANSFERRIN, FERRITIN in the last 72 hours. Studies/Results: DG Chest Portable 1 View  Result Date: 10/29/2020 CLINICAL DATA:  Shortness of breath EXAM: PORTABLE CHEST 1 VIEW COMPARISON:  10/05/2020 CT, chest x-ray 09/25/2020, 06/21/2020, 07/07/2019 FINDINGS: Cardiomegaly with vascular congestion and mild edema. Small pleural effusions. Persistent airspace disease  at the left lung base. Aortic atherosclerosis. IMPRESSION: Cardiomegaly with vascular congestion, mild edema and small pleural effusions. Persistent airspace disease at the left base, some of which appears chronic. Electronically Signed   By: Donavan Foil M.D.   On: 10/29/2020 23:01    Medications: Infusions:   Scheduled Medications: . amiodarone  100 mg Oral Daily  . apixaban  2.5 mg Oral BID  . arformoterol  15 mcg Nebulization BID  . atorvastatin  40 mg Oral Daily  . budesonide (PULMICORT) nebulizer solution  0.25 mg Nebulization BID  . diclofenac Sodium  4 g Topical QID  . famotidine  20 mg Oral Daily  . furosemide  80 mg Intravenous Q8H  . insulin aspart  0-9 Units Subcutaneous TID WC  . senna-docusate  2 tablet Oral QHS  . sodium chloride flush  3 mL Intravenous Q12H  . umeclidinium bromide  1 puff Inhalation Daily    have reviewed scheduled and prn medications.  Physical Exam: General:NAD, comfortable Heart:RRR, s1s2 nl, no rubs Lungs:clear b/l, no crackle Abdomen:soft, Non-tender, non-distended Extremities:No edema Neurology: Alert, awake, following commands  Allanna Bresee Tanna Furry 10/31/2020,9:11 AM  LOS: 1 day  Pager: [phone]

## 2020-10-31 NOTE — Consult Note (Signed)
WOC Nurse Consult Note: Patient receiving care in [facility] 2W31. Spouse present at time of my assessment. Reason for Consult: sacral pressure injury Wound type: stage 3 PI Pressure Injury POA: Yes Measurement: 6 cm x 1.8cm x 0.2 cm Wound bed: 99% pink granulation tissue, 1% yellow slough Drainage (amount, consistency, odor) none Periwound: intact Dressing procedure/placement/frequency: 1% Silvadene apply to sacral/gluteal fold wound and cover with foam dressing. The spouse states she has been using once daily application of Silvadene.  The wound looks great; therefore, the same treatment plan she has been using is being continued. Monitor the wound area(s) for worsening of condition such as: Signs/symptoms of infection,  Increase in size,  Development of or worsening of odor, Development of pain, or increased pain at the affected locations.  Notify the medical team if any of these develop.  Thank you for the consult.  Discussed plan of care with the patient and spouse.  [place] nurse will not follow at this time.  Please re-consult the [place] team if needed.  Val Riles, RN, MSN, CWOCN, CNS-BC, pager [phone]

## 2020-11-01 DIAGNOSIS — E875 Hyperkalemia: Secondary | ICD-10-CM | POA: Diagnosis not present

## 2020-11-01 LAB — MAGNESIUM: Magnesium: 1.7 mg/dL (ref 1.7–2.4)

## 2020-11-01 LAB — BASIC METABOLIC PANEL
Anion gap: 13 (ref 5–15)
BUN: 29 mg/dL — ABNORMAL HIGH (ref 8–23)
CO2: 26 mmol/L (ref 22–32)
Calcium: 8.3 mg/dL — ABNORMAL LOW (ref 8.9–10.3)
Chloride: 99 mmol/L (ref 98–111)
Creatinine, Ser: 2.18 mg/dL — ABNORMAL HIGH (ref 0.61–1.24)
GFR, Estimated: 31 mL/min — ABNORMAL LOW (ref >60)
Glucose, Bld: 98 mg/dL (ref 70–99)
Potassium: 2.9 mmol/L — ABNORMAL LOW (ref 3.5–5.1)
Sodium: 138 mmol/L (ref 135–145)

## 2020-11-01 LAB — GLUCOSE, CAPILLARY
Glucose-Capillary: 84 mg/dL (ref 70–99)
Glucose-Capillary: 122 mg/dL — ABNORMAL HIGH (ref 70–99)

## 2020-11-01 MED ORDER — POTASSIUM CHLORIDE CRYS ER 20 MEQ PO TBCR
40.0000 meq | EXTENDED_RELEASE_TABLET | Freq: Every day | ORAL | Status: DC
  Administered 2020-11-01: 40 meq via ORAL
  Filled 2020-11-01: qty 2

## 2020-11-01 MED ORDER — APIXABAN 2.5 MG PO TABS: 2.5000 mg | ORAL_TABLET | Freq: Two times a day (BID) | ORAL | 0 refills | Status: DC

## 2020-11-01 MED ORDER — SIMETHICONE 80 MG PO CHEW
80.0000 mg | CHEWABLE_TABLET | Freq: Four times a day (QID) | ORAL | Status: DC | PRN
  Administered 2020-11-01: 80 mg via ORAL
  Filled 2020-11-01 (×2): qty 1

## 2020-11-01 MED ORDER — POTASSIUM CHLORIDE CRYS ER 20 MEQ PO TBCR: 40.0000 meq | EXTENDED_RELEASE_TABLET | Freq: Every day | ORAL | 0 refills | Status: DC

## 2020-11-01 MED ORDER — MAGNESIUM OXIDE 400 (241.3 MG) MG PO TABS: 200.0000 mg | ORAL_TABLET | Freq: Every day | ORAL | 0 refills | Status: AC

## 2020-11-01 MED ORDER — SIMETHICONE 80 MG PO CHEW: 80.0000 mg | CHEWABLE_TABLET | Freq: Four times a day (QID) | ORAL | 0 refills | Status: DC | PRN

## 2020-11-01 MED ORDER — HYDROCORTISONE (PERIANAL) 2.5 % EX CREA: TOPICAL_CREAM | Freq: Two times a day (BID) | CUTANEOUS | 0 refills | Status: AC

## 2020-11-01 MED ORDER — SILVER SULFADIAZINE 1 % EX CREA: TOPICAL_CREAM | Freq: Every day | CUTANEOUS | 0 refills | Status: DC

## 2020-11-01 MED ORDER — SENNOSIDES-DOCUSATE SODIUM 8.6-50 MG PO TABS: 2.0000 | ORAL_TABLET | Freq: Every evening | ORAL | 0 refills | Status: AC | PRN

## 2020-11-01 MED ORDER — HYDROCORTISONE (PERIANAL) 2.5 % EX CREA
TOPICAL_CREAM | Freq: Two times a day (BID) | CUTANEOUS | Status: DC
  Filled 2020-11-01: qty 28.35

## 2020-11-01 MED ORDER — MAGNESIUM OXIDE 400 (241.3 MG) MG PO TABS
200.0000 mg | ORAL_TABLET | Freq: Every day | ORAL | Status: DC
  Administered 2020-11-01: 200 mg via ORAL
  Filled 2020-11-01: qty 1

## 2020-11-01 NOTE — Consult Note (Signed)
   [facility] Inpatient Consult   11/01/2020  Mithcell Schumpert December 31, 1945 [id number]   [place] Organization [ACO] Patient: [facility] Medicare/[place]  . Review information for [facility] Care Management with less than 30 days readmission.  Followed by [facility] team and [facility] noted and recently wife [facility] declined services offered by [place] Coordinator.  Patient just transitioned from hospital.  According to [facility] patient is to continue with home health services.  Patient was also active with HF paramedicine team per MD notes as well.  For questions,  Natividad Brood, RN BSN [facility] Liaison  [phone] business mobile phone Toll free office [phone]  Fax number: [phone] [email or web address]

## 2020-11-01 NOTE — Discharge Summary (Addendum)
Discharge Summary  [name] WGN:[id number] DOB: 1945/11/09  PCP: [name], MD  Admit date: 10/29/2020 Discharge date: 11/01/2020  Time spent: 35 minutes  Recommendations for Outpatient Follow-up:  1. Follow-up with your cardiologist within a week 2. Follow-up with your nephrologist in 1 to 2 weeks. 3. Follow-up with your pulmonologist in 1 to 2 weeks 4. Follow-up with your PCP in 1 to 2 weeks 5. Use a CPAP at night 6. Take your medications as prescribed 7. Continue PT OT with assistance and fall precautions 8. Continue local wound care  Discharge Diagnoses:  Active Hospital Problems   Diagnosis Date Noted  . Hyperkalemia 04/10/2019  . Acute CHF (congestive heart failure) ([facility]) 07/07/2019  . Diabetes mellitus type 2 in obese ([place])   . Chronic respiratory failure with hypoxia ([place]) 12/28/2017  . Acute on chronic respiratory failure with hypoxia ([facility]) 09/05/2016  . COPD (chronic obstructive pulmonary disease) ([facility] [place]) 07/16/2016  . CAD - Non-obstructive by LHC1/16 01/21/2016  . PAF (paroxysmal atrial fibrillation) ([place])   . Essential hypertension 10/31/2014    Resolved Hospital Problems  No resolved problems to display.    Discharge Condition: Stable  Diet recommendation: Resume previous diet.  Vitals:   11/01/20 0817 11/01/20 1145  BP: (!) 122/52 (!) 114/52  Pulse: 76 87  Resp: 17 20  Temp: 98.4 F (36.9 C) 98.6 F (37 C)  SpO2: 99% 99%    History of present illness:  Nicholas Caldwell a 74 y.o.malewith medical history significant ofCAD, A. fib/flutter on Eliquis, chronic diastolic CHF, hypertension, hyperlipidemia, pulmonary hypertension, CKD 4, COPD on 3 to 4 L at baseline, type II diabetes, OSA on CPAP, who presents with 2 days of worsening shortness of breath.  Of note, patient was recently admitted late November to early December with respiratory failure, heart failure and hypokalemia. He was discharged to SNF but did not like it there and went home  early. He has not had home health yet and has not been able to get up very much.He has had some of his medications changed around it is unclear if that is contributing. Wife states that today paramedic who comes out to help with medications did come out and straightened out some of the medication questions. He denies missing any of his medications. As above, he reports having 2 days of worsening shortness of breath. When the fire department arrived, he was saturating 80% on room air. He was placed on oxygen and given nebulizers with improvement in sats on 10 L (increased from baseline 3 to 4 L at home).  Patient denies fever, cough, chest pain, or abdominal pain.  ED Course:Vitals in ED significant for blood pressure in the 784O to 962 systolic, respiratory rate in the 20s, pulse in the 40s to 60s. Lab work-up showed BMP with sodium 131, potassium greater than 7.5, bicarb 18, BUN 30, creatinine 2.34. Glucose was 187. CBC showed stable hemoglobin at 10.2. Troponin 34. BMP elevated at 2900. Respiratory panel for flu and Covid negative. Magnesium and phosphorus within in normal limits. Chest x-ray showed edema and pulmonary congestion. Patient received calcium, insulin, Lasix in ED. EKG with peaked T waves, widened QRS, intermittent bradycardia. Nephrology was consulted for his hyperkalemia.   He was seen by nephrology, Dr. Carolin Sicks who recommended given chronic use of diuretics to be on smaller dose of oral potassium supplement as outpatient.  And to check lab within a week to monitor potassium level after discharge from the hospital.  He will also need to  see his nephrologist Dr. Marval Regal after discharge from the hospital.  Patient was also seen by wound care specialist for sacral pressure injury, POA, recommendations were made for local wound care.  Was evaluated by PT with recommendation for SNF however patient prefers to go home with home health services.  TOC assisted with  home health services arrangement.  11/01/20:  Seen and examined at his bedside.  Reports feeling gassy with some anal discomfort with flatulence.  Simethicone and Anusol were provided.  No other complaints.  Patient advised to have a close follow-up appointment with his cardiologist and nephrologist, to use his CPAP at night.  Patient understands and agrees to plan.  Hospital Course:  Principal Problem:   Hyperkalemia Active Problems:   Essential hypertension   PAF (paroxysmal atrial fibrillation) ([facility])   CAD - Non-obstructive by LHC1/16   COPD (chronic obstructive pulmonary disease) ([facility])   Acute on chronic respiratory failure with hypoxia ([facility])   Chronic respiratory failure with hypoxia ([facility])   Diabetes mellitus type 2 in obese ([facility])   Acute CHF (congestive heart failure) ([facility])  Resolved severe hyperkalemia with EKG changes likely secondary to excessive potassium supplement and advanced CKD, stage 4 Presented with serum potassium 7.5 with peaked T waves, treated with calcium gluconate, insulin, Lokelma and Lasix Sodium potassium was initially normalized at 3.6, then dropped to 2.9. Start KCl 40 mEq daily, repeat BMP on Friday, 11/02/2020 and on Monday, 11/05/2020. Closely follow-up with your PCP within a week Follow-up with your nephrologist Dr. Marval Regal in 1 to 2 weeks. Was seen by nephrology, recommended to start on smaller dose of oral potassium supplement as outpatient and to repeat BMP within a week to monitor potassium level after discharge from the hospital.  Acute on chronic diastolic CHF with severe pulmonary hypertension Received IV diuretics with net I&O -7.1 L Continue as recommended by nephrology, home torsemide 80 mg twice daily, added p.o. potassium, 40 mEq daily and p.o. magnesium oxide 200 mg daily. Follow-up with cardiology within a week. Last 2D echo was done on 09/26/2020 showing LVEF 50 to 55% with estimated right ventricular systolic pressure of 68 mmhg, right  atrial size was severely dilated, tricuspid valve regurgitation is moderate to severe.  Severely dilated pulmonary artery.  CKD stage IV He appears to be back to his baseline creatinine Avoid nephrotoxins Follows with Dr. Marval Regal outpatient  Resolved acute on chronic hypoxic aspiratory failure At baseline requires 3 to 4 L Currently on 4 L with O2 saturation of 100%. On admission was requiring 10 L. Follow-up with your pulmonologist Dr. Melvyn Novas in 1 to 2 weeks  Chronic A. Fib Currently rate controlled Continue home regimen On amiodarone for rhythm control On Eliquis for CVA prevention  OSA Continue home CPAP Counseled on the importance of compliance with CPAP.  Nonobstructive coronary artery disease Cath in 2020 Denies any anginal symptoms Continue home regimen  Type 2 diabetes Hemoglobin A1c 6.7 on 09/25/2020 Resume home regimen. Follow-up with your PCP.  Sacral pressure injury, POA Wound care specialist consulted Continue local wound care as recommended by wound care specialist.  Chronic back pain Continue home regimen Continue bowel regimen to avoid opiate-induced constipation.  Follow-up with your prescribing provider outpatient.  Suspected external hemorrhoids/flatulence Added Anusol and simethicone respectively Follow-up with your PCP  Code Status: Full code  Family Communication: Updated his wife at bedside on 10/31/2020.  Disposition Plan: Home with home health services PT/OT/RN/home health aide   Consultants:  Nephrology  Procedures:  None.  Antimicrobials:  None  DVT prophylaxis: Eliquis.   Discharge Exam: BP (!) 114/52 (BP Location: Left Arm)   Pulse 87   Temp 98.6 F (37 C) (Oral)   Resp 20   Wt 106.3 kg   SpO2 99%   BMI 31.78 kg/m  . General: 74 y.o. year-old male well developed well nourished in no acute distress.  Alert and oriented x3. . Cardiovascular: Irregular rate and rhythm with no rubs or gallops.   No thyromegaly or JVD noted.   Marland Kitchen Respiratory: Clear to auscultation with no wheezes or rales. Good inspiratory effort. . Abdomen: Obese bowel sounds present.  . Musculoskeletal: No lower extremity edema. 2/4 pulses in all 4 extremities. Marland Kitchen Psychiatry: Mood is appropriate for condition and setting  Discharge Instructions You were cared for by a hospitalist during your hospital stay. If you have any questions about your discharge medications or the care you received while you were in the hospital after you are discharged, you can call the unit and asked to speak with the hospitalist on call if the hospitalist that took care of you is not available. Once you are discharged, your primary care physician will handle any further medical issues. Please note that NO REFILLS for any discharge medications will be authorized once you are discharged, as it is imperative that you return to your primary care physician (or establish a relationship with a primary care physician if you do not have one) for your aftercare needs so that they can reassess your need for medications and monitor your lab values.   Allergies as of 11/01/2020      Reactions   Bidil [isosorb Dinitrate-hydralazine] Shortness Of Breath   Benazepril Nausea And Vomiting   Brimonidine    Other reaction(s): Other (See Comments)   Metformin    Other reaction(s): Cramps (ALLERGY/intolerance), Other (See Comments)   Morphine Rash      Medication List    STOP taking these medications   acidophilus Caps capsule   amoxicillin-clavulanate 875-125 MG tablet Commonly known as: AUGMENTIN   diazepam 2 MG tablet Commonly known as: VALIUM     TAKE these medications   acetaminophen 325 MG tablet Commonly known as: TYLENOL Take 2 tablets (650 mg total) by mouth every 4 (four) hours as needed for headache or mild pain.   albuterol 108 (90 Base) MCG/ACT inhaler Commonly known as: VENTOLIN HFA Inhale 2 puffs into the lungs every 6 (six) hours  as needed for wheezing or shortness of breath.   albuterol (2.5 MG/3ML) 0.083% nebulizer solution Commonly known as: PROVENTIL Take 3 mLs (2.5 mg total) by nebulization every 4 (four) hours as needed for wheezing or shortness of breath.   amiodarone 200 MG tablet Commonly known as: PACERONE Take 0.5 tablets (100 mg total) by mouth daily.   apixaban 2.5 MG Tabs tablet Commonly known as: ELIQUIS Take 1 tablet (2.5 mg total) by mouth 2 (two) times daily. What changed:   medication strength  how much to take   ascorbic acid 500 MG tablet Commonly known as: VITAMIN C Take 500 mg by mouth 3 (three) times a week. Monday Wednesday Friday   atorvastatin 40 MG tablet Commonly known as: LIPITOR Take 1 tablet (40 mg total) by mouth daily.   Breztri Aerosphere 160-9-4.8 MCG/ACT Aero Generic drug: Budeson-Glycopyrrol-Formoterol Inhale 2 puffs into the lungs 2 (two) times daily.   carbamide peroxide 6.5 % OTIC solution Commonly known as: DEBROX Place 5-10 drops into both ears daily as needed (ear wax).  Carboxymethylcellulose Sod PF 0.25 % Soln Place 1 drop into both eyes 4 (four) times daily.   cetirizine 10 MG tablet Commonly known as: ZYRTEC Take 10 mg by mouth daily as needed for allergies.   famotidine 20 MG tablet Commonly known as: PEPCID TAKE 1 TABLET(20 MG) BY MOUTH TWICE DAILY What changed: See the new instructions.   fluticasone 50 MCG/ACT nasal spray Commonly known as: FLONASE Place 2 sprays into both nostrils daily as needed for allergies.   hydrocortisone 1 % lotion Apply 1 application topically See admin instructions. Apply small amount to back twice daily for itchy rash as needed   hydrocortisone 2.5 % rectal cream Commonly known as: ANUSOL-HC Place 1 application rectally 2 (two) times daily as needed for hemorrhoids or itching. What changed: Another medication with the same name was added. Make sure you understand how and when to take each.    hydrocortisone 2.5 % rectal cream Commonly known as: ANUSOL-HC Apply topically 2 (two) times daily for 3 days. What changed: You were already taking a medication with the same name, and this prescription was added. Make sure you understand how and when to take each.   insulin aspart 100 UNIT/ML injection Commonly known as: novoLOG Inject 0-15 Units into the skin 3 (three) times daily with meals.   Insulin Syringe 27G X 1/2" 0.5 ML Misc 100 Syringes by Does not apply route 3 (three) times daily.   latanoprost 0.005 % ophthalmic solution Commonly known as: XALATAN Place 1 drop into both eyes at bedtime.   lidocaine 5 % Commonly known as: LIDODERM Place 1 patch onto the skin daily. Remove & Discard patch within 12 hours or as directed by MD   magnesium oxide 400 (241.3 Mg) MG tablet Commonly known as: MAG-OX Take 0.5 tablets (200 mg total) by mouth daily.   metolazone 2.5 MG tablet Commonly known as: ZAROXOLYN Take 2.5 mg by mouth See admin instructions. 1 tablet on Monday and Wednesday as needed  for weight gain of 3 pounds in 24 hours   OXYGEN Inhale 3 L into the lungs continuous.   Percocet 10-325 MG tablet Generic drug: oxyCODONE-acetaminophen Take 1 tablet by mouth every 6 (six) hours as needed for pain.   potassium chloride SA 20 MEQ tablet Commonly known as: KLOR-CON Take 2 tablets (40 mEq total) by mouth daily. What changed: when to take this   PRESCRIPTION MEDICATION Cpap   senna-docusate 8.6-50 MG tablet Commonly known as: Senokot-S Take 2 tablets by mouth at bedtime as needed for up to 14 days for mild constipation.   silver sulfADIAZINE 1 % cream Commonly known as: SILVADENE Apply topically daily.   simethicone 80 MG chewable tablet Commonly known as: MYLICON Chew 1 tablet (80 mg total) by mouth every 6 (six) hours as needed for flatulence.   sodium chloride 0.65 % Soln nasal spray Commonly known as: OCEAN Place 2 sprays into both nostrils as needed  for congestion.   tamsulosin 0.4 MG Caps capsule Commonly known as: FLOMAX Take 1 capsule (0.4 mg total) by mouth daily.   torsemide 20 MG tablet Commonly known as: DEMADEX Take 4 tablets (80 mg total) by mouth 2 (two) times daily.   zinc oxide 20 % ointment Apply 1 application topically 2 (two) times daily as needed for irritation (rash).            Durable Medical Equipment  (From admission, onward)         Start     Ordered  11/01/20 1202  For home use only DME Other see comment  Once       Comments: [facility]oyer lift  Question:  Length of Need  Answer:  12 Months   11/01/20 1202         Allergies  Allergen Reactions  . Bidil [Isosorb Dinitrate-Hydralazine] Shortness Of Breath  . Benazepril Nausea And Vomiting  . Brimonidine     Other reaction(s): Other (See Comments)  . Metformin     Other reaction(s): Cramps (ALLERGY/intolerance), Other (See Comments)  . Morphine Rash    Follow-up Information    [name], MD. Call in 1 day(s).   Specialty: Internal Medicine Why: Please call for a post hospital follow-up appointment. Contact information: [address] [place] [address] [phone]        Larey Dresser, MD. Call in 1 day(s).   Specialty: Cardiology Why: Please call for a post hospital follow-up appointment within a week. Contact information: [address] [place] [address] [phone]        Tanda Rockers, MD. Call in 1 day(s).   Specialty: Pulmonary Disease Why: Please call for a post hospital follow-up appointment. Contact information: [place] [address] [phone]        Donato Heinz, MD. Call in 1 day(s).   Specialty: Nephrology Why: Please call for a post hospital follow-up appointment within a week. Contact information: [place] [address] [phone]                The results of significant diagnostics from this hospitalization  (including imaging, microbiology, ancillary and laboratory) are listed below for reference.    Significant Diagnostic Studies: CT ABDOMEN WO CONTRAST  Result Date: 10/05/2020 CLINICAL DATA:  Upper abdominal and lower chest mass. EXAM: CT CHEST AND ABDOMEN WITHOUT CONTRAST TECHNIQUE: Multidetector CT imaging of the chest and abdomen was performed following the standard protocol without intravenous contrast. COMPARISON:  CT chest without contrast 01/03/2019. CT of the abdomen and pelvis without contrast 08/28/2019 FINDINGS: CT CHEST FINDINGS WITHOUT CONTRAST Cardiovascular: Heart is enlarged, stable. Coronary artery calcifications are present. Atherosclerotic changes are present in the aorta without aneurysm or focal stenosis. Mediastinum/Nodes: Multiple paratracheal and prevascular lymph nodes are similar the prior exam. Bilateral hilar adenopathy is similar the prior exam. Esophagus is within normal limits. Thoracic inlet is normal. Lungs/Pleura: Paraseptal emphysematous changes are again noted. Bilateral pleural effusions are present with associated atelectasis. New ill-defined airspace disease is present in the lateral aspect of the right upper lobe on image 53 of series 4. Previously noted 3 mm nodule is present on image 57. Areas of dependent atelectasis present in the lower lobes bilaterally. Other scattered areas of ground-glass attenuation are present in the anterior right middle lobe. The airways are patent. 5.5 cm left upper lobe pulmonary nodule is stable. Additional scarring is present both lung apices. Musculoskeletal: Prominent anterior xiphoid process tenting the soft tissues is stable. Ribs are unremarkable. Ankylosis of the lower thoracic spine is noted. No acute osseous abnormality is present. Gynecomastia is noted. CT ABDOMEN FINDINGS WITHOUT CONTRAST Hepatobiliary: No focal liver abnormality is seen. No gallstones, gallbladder wall thickening, or biliary dilatation. Portal caval lymph nodes  are increasing in size. Largest node measures 2.4 cm in short axis. Pancreas: Soft tissue nodule anterior to the proximal body the pancreas on image 65 measuring 2.7 x 2.2 cm is consistent with a prominent enlarged lymph node. Pancreas is otherwise unremarkable. Spleen:  Splenules are noted. Adrenals/Urinary Tract: Stable left low-density adrenal lesion present, consistent with adrenal adenoma there is slight stranding about the right kidney without scratched at there is slight stranding about both kidneys without obstruction or mass lesion. Stomach/Bowel: The stomach is within normal limits. Duodenal diverticulum is noted without focal inflammatory change. Visualized small bowel is unremarkable. Visualized portions the colon are within normal limits. Appendix is visualized and normal. Vascular/Lymphatic: Atherosclerotic calcifications are present in the aorta branch vessels without aneurysm. Other: No abdominal wall hernia or abnormality. No abdominopelvic ascites. Musculoskeletal: Lumbar vertebral body heights are within normal limits. IMPRESSION: 1. New ill-defined airspace disease in the lateral aspect of the right upper lobe is concerning for pneumonia. Other scattered areas of ground-glass attenuation are likely related. 2. Stable 5.5 cm left upper lobe pulmonary nodule. 3. Stable mediastinal and hilar adenopathy. 4. Bilateral pleural effusions and associated atelectasis. 5. Cardiomegaly without failure. 6. Coronary artery disease. 7. Stable left adrenal adenoma. 8. Increased size of portacaval and peripancreatic lymph nodes. These are most likely reactive. Recommend short-term follow-up CT of the abdomen and pelvis at 3-6 months. 9. Aortic Atherosclerosis (ICD10-I70.0) and Emphysema (ICD10-J43.9). Electronically Signed   By: San Morelle M.D.   On: 10/05/2020 19:45   DG Abd 1 View  Result Date: 10/04/2020 CLINICAL DATA:  Generalized abdominal pain for 1 day EXAM: ABDOMEN - 1 VIEW COMPARISON:   08/27/2019 FINDINGS: Scattered large and small bowel gas is noted. No obstructive changes are seen. No free air is noted. Left hip replacement is again noted and stable. No acute bony abnormality is seen. Mild degenerative changes of lumbar spine are. IMPRESSION: No acute abnormality in the abdomen. Electronically Signed   By: Inez Catalina M.D.   On: 10/04/2020 19:37   CT CHEST WO CONTRAST  Result Date: 10/05/2020 CLINICAL DATA:  Upper abdominal and lower chest mass. EXAM: CT CHEST AND ABDOMEN WITHOUT CONTRAST TECHNIQUE: Multidetector CT imaging of the chest and abdomen was performed following the standard protocol without intravenous contrast. COMPARISON:  CT chest without contrast 01/03/2019. CT of the abdomen and pelvis without contrast 08/28/2019 FINDINGS: CT CHEST FINDINGS WITHOUT CONTRAST Cardiovascular: Heart is enlarged, stable. Coronary artery calcifications are present. Atherosclerotic changes are present in the aorta without aneurysm or focal stenosis. Mediastinum/Nodes: Multiple paratracheal and prevascular lymph nodes are similar the prior exam. Bilateral hilar adenopathy is similar the prior exam. Esophagus is within normal limits. Thoracic inlet is normal. Lungs/Pleura: Paraseptal emphysematous changes are again noted. Bilateral pleural effusions are present with associated atelectasis. New ill-defined airspace disease is present in the lateral aspect of the right upper lobe on image 53 of series 4. Previously noted 3 mm nodule is present on image 57. Areas of dependent atelectasis present in the lower lobes bilaterally. Other scattered areas of ground-glass attenuation are present in the anterior right middle lobe. The airways are patent. 5.5 cm left upper lobe pulmonary nodule is stable. Additional scarring is present both lung apices. Musculoskeletal: Prominent anterior xiphoid process tenting the soft tissues is stable. Ribs are unremarkable. Ankylosis of the lower thoracic spine is noted. No  acute osseous abnormality is present. Gynecomastia is noted. CT ABDOMEN FINDINGS WITHOUT CONTRAST Hepatobiliary: No focal liver abnormality is seen. No gallstones, gallbladder wall thickening, or biliary dilatation. Portal caval lymph nodes are increasing in size. Largest node measures 2.4 cm in short axis. Pancreas: Soft tissue nodule anterior to the proximal body the pancreas on image 65 measuring 2.7 x 2.2 cm is consistent with a prominent  enlarged lymph node. Pancreas is otherwise unremarkable. Spleen: Splenules are noted. Adrenals/Urinary Tract: Stable left low-density adrenal lesion present, consistent with adrenal adenoma there is slight stranding about the right kidney without scratched at there is slight stranding about both kidneys without obstruction or mass lesion. Stomach/Bowel: The stomach is within normal limits. Duodenal diverticulum is noted without focal inflammatory change. Visualized small bowel is unremarkable. Visualized portions the colon are within normal limits. Appendix is visualized and normal. Vascular/Lymphatic: Atherosclerotic calcifications are present in the aorta branch vessels without aneurysm. Other: No abdominal wall hernia or abnormality. No abdominopelvic ascites. Musculoskeletal: Lumbar vertebral body heights are within normal limits. IMPRESSION: 1. New ill-defined airspace disease in the lateral aspect of the right upper lobe is concerning for pneumonia. Other scattered areas of ground-glass attenuation are likely related. 2. Stable 5.5 cm left upper lobe pulmonary nodule. 3. Stable mediastinal and hilar adenopathy. 4. Bilateral pleural effusions and associated atelectasis. 5. Cardiomegaly without failure. 6. Coronary artery disease. 7. Stable left adrenal adenoma. 8. Increased size of portacaval and peripancreatic lymph nodes. These are most likely reactive. Recommend short-term follow-up CT of the abdomen and pelvis at 3-6 months. 9. Aortic Atherosclerosis (ICD10-I70.0) and  Emphysema (ICD10-J43.9). Electronically Signed   By: San Morelle M.D.   On: 10/05/2020 19:45   US RENAL  Result Date: 10/03/2020 CLINICAL DATA:  Acute renal insufficiency EXAM: RENAL / URINARY TRACT ULTRASOUND COMPLETE COMPARISON:  08/28/2019, 01/03/2019 FINDINGS: Right Kidney: Renal measurements: 10.9 x 5.2 x 4.5 cm = volume: 133.3 mL. Echogenicity within normal limits. No mass or hydronephrosis visualized. Left Kidney: Renal measurements: 10.4 x 5.0 x 4.6 cm = volume: 126.3 mL. Echogenicity within normal limits. No mass or hydronephrosis visualized. Bladder: Appears normal for degree of bladder distention. Other: None. IMPRESSION: 1. Unremarkable renal ultrasound. Electronically Signed   By: Randa Ngo M.D.   On: 10/03/2020 19:33   DG Chest Portable 1 View  Result Date: 10/29/2020 CLINICAL DATA:  Shortness of breath EXAM: PORTABLE CHEST 1 VIEW COMPARISON:  10/05/2020 CT, chest x-ray 09/25/2020, 06/21/2020, 07/07/2019 FINDINGS: Cardiomegaly with vascular congestion and mild edema. Small pleural effusions. Persistent airspace disease at the left lung base. Aortic atherosclerosis. IMPRESSION: Cardiomegaly with vascular congestion, mild edema and small pleural effusions. Persistent airspace disease at the left base, some of which appears chronic. Electronically Signed   By: Donavan Foil M.D.   On: 10/29/2020 23:01   DG CHEST PORT 1 VIEW  Result Date: 10/05/2020 CLINICAL DATA:  Respiratory failure.  Fever EXAM: PORTABLE CHEST 1 VIEW COMPARISON:  09/25/2020 FINDINGS: Improvement in right lower lobe airspace disease and right effusion. Moderate left lower lobe airspace disease unchanged. Pulmonary vascularity normal. IMPRESSION: Improvement in right lower lobe airspace disease. No change in moderate left lower lobe airspace disease. Electronically Signed   By: Franchot Gallo M.D.   On: 10/05/2020 10:27    Microbiology: Recent Results (from the past 240 hour(s))  Resp Panel by RT-PCR (Flu  A&B, Covid) Nasopharyngeal Swab     Status: None   Collection Time: 10/29/20 10:46 PM   Specimen: Nasopharyngeal Swab; Nasopharyngeal(NP) swabs in vial transport medium  Result Value Ref Range Status   SARS Coronavirus 2 by RT PCR NEGATIVE NEGATIVE Final    Comment: (NOTE) SARS-CoV-2 target nucleic acids are NOT DETECTED.  The SARS-CoV-2 RNA is generally detectable in upper respiratory specimens during the acute phase of infection. The lowest concentration of SARS-CoV-2 viral copies this assay can detect is 138 copies/mL. A negative result does not  preclude SARS-Cov-2 infection and should not be used as the sole basis for treatment or other patient management decisions. A negative result may occur with  improper specimen collection/handling, submission of specimen other than nasopharyngeal swab, presence of viral mutation(s) within the areas targeted by this assay, and inadequate number of viral copies(<138 copies/mL). A negative result must be combined with clinical observations, patient history, and epidemiological information. The expected result is Negative.  Fact Sheet for Patients:  EntrepreneurPulse.com.au  Fact Sheet for Healthcare Providers:  IncredibleEmployment.be  This test is no t yet approved or cleared by the [place] FDA and  has been authorized for detection and/or diagnosis of SARS-CoV-2 by FDA under an Emergency Use Authorization (EUA). This EUA will remain  in effect (meaning this test can be used) for the duration of the COVID-19 declaration under Section 564(b)(1) of the Act, 21 U.S.C.section 360bbb-3(b)(1), unless the authorization is terminated  or revoked sooner.       Influenza A by PCR NEGATIVE NEGATIVE Final   Influenza B by PCR NEGATIVE NEGATIVE Final    Comment: (NOTE) The Xpert Xpress SARS-CoV-2/FLU/RSV plus assay is intended as an aid in the diagnosis of influenza from Nasopharyngeal swab specimens  and should not be used as a sole basis for treatment. Nasal washings and aspirates are unacceptable for Xpert Xpress SARS-CoV-2/FLU/RSV testing.  Fact Sheet for Patients: EntrepreneurPulse.com.au  Fact Sheet for Healthcare Providers: IncredibleEmployment.be  This test is not yet approved or cleared by the [place] FDA and has been authorized for detection and/or diagnosis of SARS-CoV-2 by FDA under an Emergency Use Authorization (EUA). This EUA will remain in effect (meaning this test can be used) for the duration of the COVID-19 declaration under Section 564(b)(1) of the Act, 21 U.S.C. section 360bbb-3(b)(1), unless the authorization is terminated or revoked.  Performed at [facility], [place] [address], [place], [place] [address]      Labs: Basic Metabolic Panel: Recent Labs  Lab 10/29/20 2252 10/29/20 2309 10/30/20 0133 10/30/20 0326 10/30/20 0834 10/30/20 1046 10/30/20 1258 10/30/20 1500 10/31/20 0318 11/01/20 0709  NA 131*  --  130*  --  135  --   --   --  136 138  K >7.5*  --  >7.5*   < > 6.0* 4.7 4.7 4.5 3.6 2.9*  CL 105  --  107  --  105  --   --   --  101 99  CO2 18*  --  17*  --  20*  --   --   --  24 26  GLUCOSE 187*  --  180*  --  89  --   --   --  96 98  BUN 30*  --  29*  --  29*  --   --   --  30* 29*  CREATININE 2.34*  --  2.39*  --  2.31*  --   --   --  2.17* 2.18*  CALCIUM 8.9  --  8.8*  --  9.8  --   --   --  8.7* 8.3*  MG  --  1.9  --   --   --   --   --   --   --  1.7  PHOS  --  2.7  --   --  3.7  --   --   --  4.6  --    < > = values in this interval not displayed.   Liver Function Tests: Recent  Labs  Lab 10/30/20 0133 10/30/20 0834 10/31/20 0318  AST 25  --   --   ALT 19  --   --   ALKPHOS 134*  --   --   BILITOT 0.5  --   --   PROT 6.7  --   --   ALBUMIN 2.9* 2.9* 2.4*   No results for input(s): LIPASE, AMYLASE in the last 168 hours. No results for input(s): AMMONIA in the last 168  hours. CBC: Recent Labs  Lab 10/29/20 2252 10/30/20 0133  WBC 7.5 7.1  HGB 10.2* 10.4*  HCT 34.8* 37.1*  MCV 90.6 92.3  PLT 381 358   Cardiac Enzymes: No results for input(s): CKTOTAL, CKMB, CKMBINDEX, TROPONINI in the last 168 hours. BNP: BNP (last 3 results) Recent Labs    10/01/20 1609 10/05/20 0105 10/29/20 2252  BNP 3,013.5* 1,180.7* 2,913.5*    ProBNP (last 3 results) No results for input(s): PROBNP in the last 8760 hours.  CBG: Recent Labs  Lab 10/31/20 1146 10/31/20 1621 10/31/20 2228 11/01/20 0820 11/01/20 1204  GLUCAP 92 95 125* 84 122*       Signed:  Kayleen Memos, MD [facility] 11/01/2020, 12:38 PM

## 2020-11-01 NOTE — TOC Progression Note (Addendum)
Transition of Care [facility]) - Progression Note    Patient Details  Name: Nicholas Caldwell MRN: [id number] Date of Birth: 03-22-1946  Transition of Care [facility]) CM/SW [place], RN Phone Number: [phone]  11/01/2020, 9:22 AM  Clinical Narrative:    CM has verified that patient is currently active with [place] and [facility] services will resume upon discharge. CM has attempted to call [employer] in reference to hoyer lift message has been left for [employer] SW covering for  [employer]. Patient and wife state that they do not need hoyer lift immediately but would like to have one. CM will continue to work on obtaining hoyer lift.    1100 CM reached out to [employer] for [employer]. [employer] is currently waiting for hoyer lifts to arrive and states that Rep from [place] will notify patient and family when shipment arrives. Harrel Lemon will be delivered at later date. Patient and wife are agreeable to delivery for later date. Transport has been set up with [facility].         Expected Discharge Plan and Services           Expected Discharge Date: 11/01/20                                     Social Determinants of Health (SDOH) Interventions    Readmission Risk Interventions Readmission Risk Prevention Plan 08/15/2019 06/13/2019 04/14/2019  Transportation Screening Complete Complete Complete  Medication Review Press photographer) Not Complete Complete Complete  Med Review Comments requested discharge medication reconciliation by attending - -  PCP or Specialist appointment within 3-5 days of discharge Not Complete Complete Complete  PCP/Specialist Appt Not Complete comments [facility] informed [facility] and will contact pt directly regarding follow up appt - 6/17 with cardiology  [place] or Home Care Consult Complete Complete Complete  SW Recovery Care/Counseling Consult - Complete Complete  Palliative Care Screening - Not Applicable Not [place] -  Not Applicable Not Applicable  Some recent data might be hidden

## 2020-11-01 NOTE — Plan of Care (Signed)
Problem: Clinical Measurements: Goal: Ability to maintain clinical measurements within normal limits will improve Outcome: Progressing Goal: Will remain free from infection Outcome: Progressing Goal: Diagnostic test results will improve Outcome: Progressing Goal: Respiratory complications will improve Outcome: Progressing Goal: Cardiovascular complication will be avoided Outcome: Progressing

## 2020-11-05 ENCOUNTER — Ambulatory Visit: Payer: No Typology Code available for payment source | Admitting: Internal Medicine

## 2020-11-08 DIAGNOSIS — I509 Heart failure, unspecified: Secondary | ICD-10-CM | POA: Diagnosis not present

## 2020-11-08 DIAGNOSIS — E875 Hyperkalemia: Secondary | ICD-10-CM | POA: Diagnosis not present

## 2020-11-09 ENCOUNTER — Telehealth (HOSPITAL_COMMUNITY): Payer: Self-pay | Admitting: *Deleted

## 2020-11-09 MED ORDER — POTASSIUM CHLORIDE CRYS ER 20 MEQ PO TBCR: 20.0000 meq | EXTENDED_RELEASE_TABLET | Freq: Two times a day (BID) | ORAL | 0 refills | Status: DC

## 2020-11-09 NOTE — Telephone Encounter (Signed)
Received labs drawn from [facility]  K= 3.2  Per Allena Katz, NP if pt has been taking KCL 40 meq Daily increase to 40 meq BID for 2 days then resume 40 meq Daily, recheck bmet next week.  Called and spoke w/pt's wife, she states pt has been taking KCL 20 meq Twice daily, she will increase him to 40 meq Twice daily for 2 days then resume the 20 meq Twice daily, med list updated to 20 meq Twice daily instead of 40 meq Daily. Order sent to [facility] to recheck labs next week.

## 2020-11-10 IMAGING — DX PORTABLE CHEST - 1 VIEW
1 series · 1 of 1 positions shown · non-contrast
Comparison: 04/14/2019

CLINICAL DATA: Shortness of breath

EXAM:
PORTABLE CHEST 1 VIEW

[chest ap]
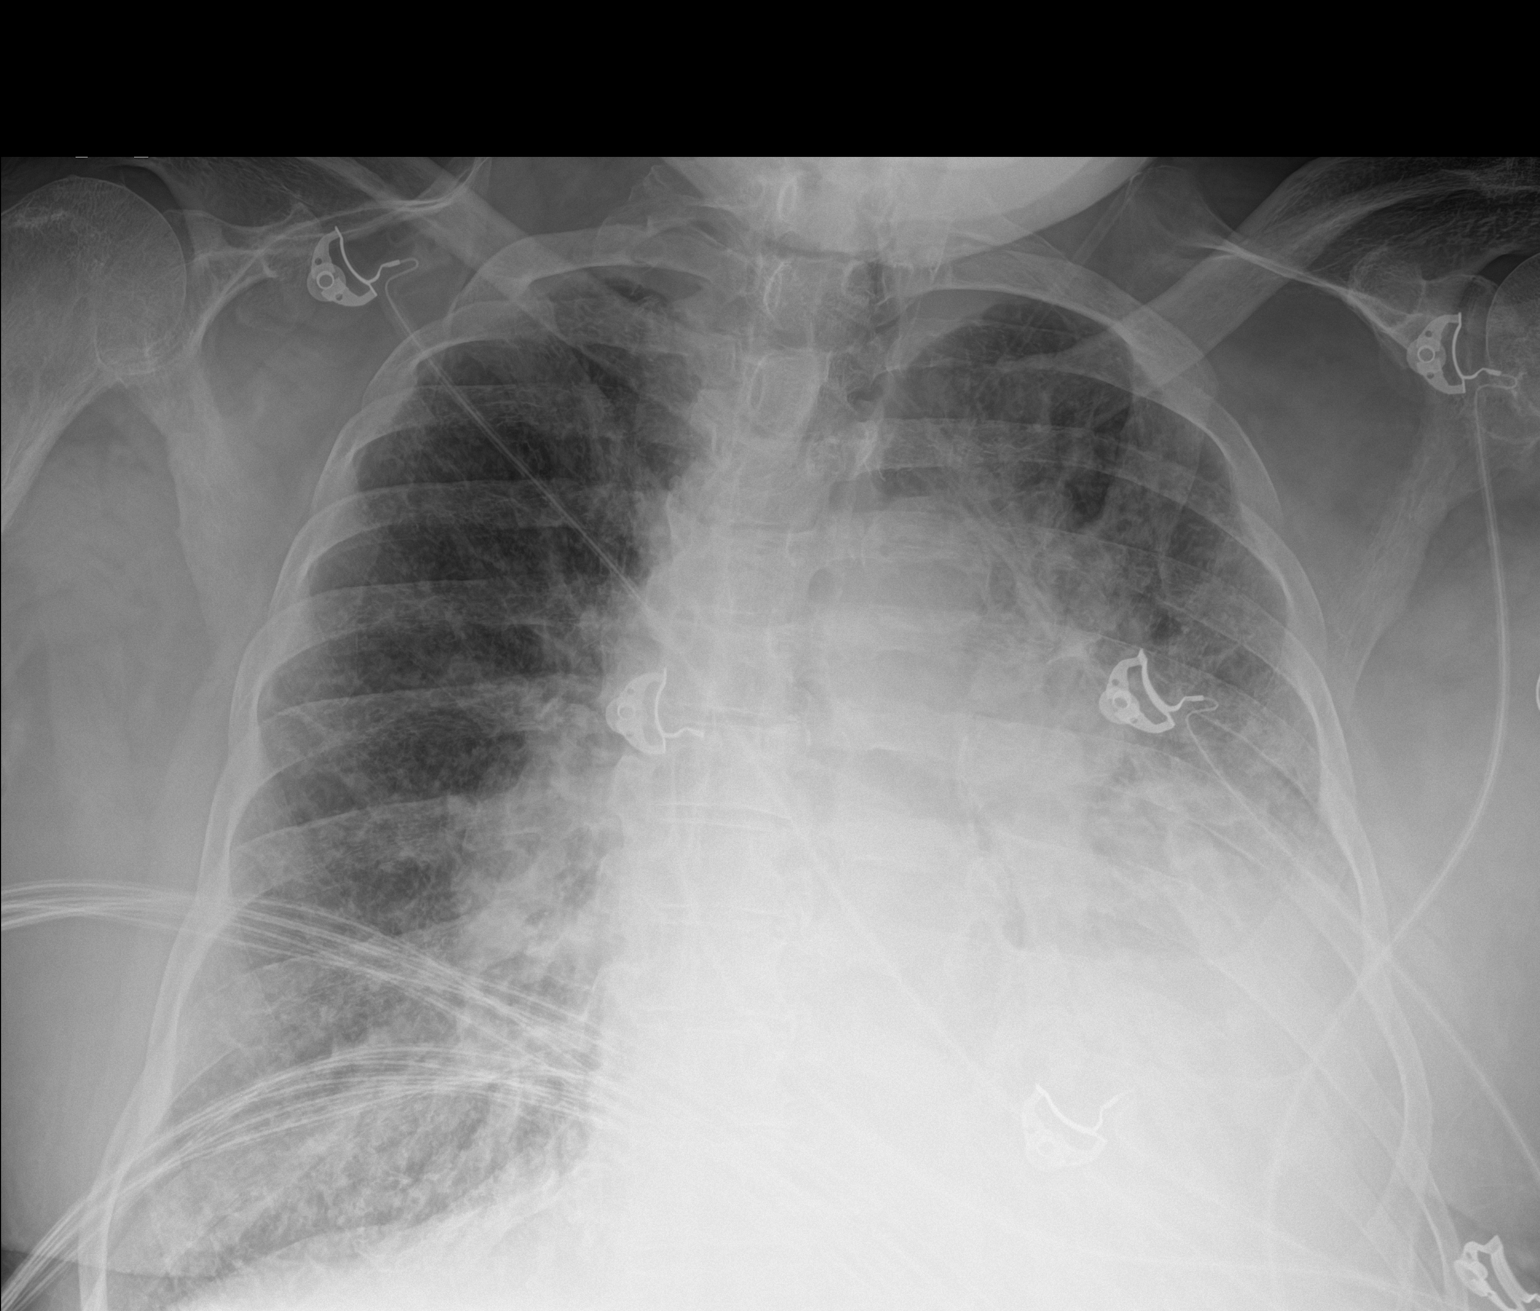

[1 of 1 positions shown; findings below may reference images not displayed]

FINDINGS: Cardiomegaly and vascular pedicle widening. Diffuse interstitial
coarsening with limited retrocardiac aeration which is stable from
prior and primarily from cardiomegaly with atelectasis by chest CT.
No pneumothorax.
IMPRESSION: CHF pattern.

## 2020-11-13 ENCOUNTER — Encounter (HOSPITAL_COMMUNITY): Payer: No Typology Code available for payment source

## 2020-11-13 ENCOUNTER — Telehealth (HOSPITAL_COMMUNITY): Payer: Self-pay | Admitting: Cardiology

## 2020-11-13 NOTE — Telephone Encounter (Signed)
Attempted to contact patient for appt reminder No answer unable to leave message

## 2020-11-16 ENCOUNTER — Telehealth (HOSPITAL_COMMUNITY): Payer: Self-pay | Admitting: *Deleted

## 2020-11-16 NOTE — Telephone Encounter (Signed)
Sarah with [facility] said she went to the patients home for skilled nursing visit today she can hear people in the home but they are not answering the door or the telephone. She is unable to obtain labs since he won't open the door. She will try again next week.   Call back number [phone]  Routed to [place] as an [place]

## 2020-11-21 ENCOUNTER — Emergency Department (HOSPITAL_COMMUNITY): Payer: No Typology Code available for payment source

## 2020-11-21 ENCOUNTER — Inpatient Hospital Stay (HOSPITAL_COMMUNITY)
Admission: EM | Admit: 2020-11-21 | Discharge: 2020-11-27 | DRG: 291 | Disposition: A | Discharge status: Home Health | Payer: No Typology Code available for payment source | Attending: Internal Medicine | Admitting: Internal Medicine

## 2020-11-21 ENCOUNTER — Encounter (HOSPITAL_COMMUNITY): Payer: Self-pay | Admitting: Internal Medicine

## 2020-11-21 DIAGNOSIS — Z885 Allergy status to narcotic agent status: Secondary | ICD-10-CM | POA: Diagnosis not present

## 2020-11-21 DIAGNOSIS — I428 Other cardiomyopathies: Secondary | ICD-10-CM | POA: Diagnosis present

## 2020-11-21 DIAGNOSIS — G4733 Obstructive sleep apnea (adult) (pediatric): Secondary | ICD-10-CM | POA: Diagnosis present

## 2020-11-21 DIAGNOSIS — Z20822 Contact with and (suspected) exposure to covid-19: Secondary | ICD-10-CM | POA: Diagnosis present

## 2020-11-21 DIAGNOSIS — Z79891 Long term (current) use of opiate analgesic: Secondary | ICD-10-CM

## 2020-11-21 DIAGNOSIS — E875 Hyperkalemia: Secondary | ICD-10-CM | POA: Diagnosis present

## 2020-11-21 DIAGNOSIS — Z9081 Acquired absence of spleen: Secondary | ICD-10-CM

## 2020-11-21 DIAGNOSIS — Z743 Need for continuous supervision: Secondary | ICD-10-CM | POA: Diagnosis not present

## 2020-11-21 DIAGNOSIS — I2510 Atherosclerotic heart disease of native coronary artery without angina pectoris: Secondary | ICD-10-CM | POA: Diagnosis present

## 2020-11-21 DIAGNOSIS — L89153 Pressure ulcer of sacral region, stage 3: Secondary | ICD-10-CM | POA: Diagnosis present

## 2020-11-21 DIAGNOSIS — E785 Hyperlipidemia, unspecified: Secondary | ICD-10-CM | POA: Diagnosis present

## 2020-11-21 DIAGNOSIS — J441 Chronic obstructive pulmonary disease with (acute) exacerbation: Secondary | ICD-10-CM | POA: Diagnosis not present

## 2020-11-21 DIAGNOSIS — Z888 Allergy status to other drugs, medicaments and biological substances status: Secondary | ICD-10-CM

## 2020-11-21 DIAGNOSIS — I480 Paroxysmal atrial fibrillation: Secondary | ICD-10-CM | POA: Diagnosis present

## 2020-11-21 DIAGNOSIS — Z7401 Bed confinement status: Secondary | ICD-10-CM

## 2020-11-21 DIAGNOSIS — R279 Unspecified lack of coordination: Secondary | ICD-10-CM | POA: Diagnosis not present

## 2020-11-21 DIAGNOSIS — N184 Chronic kidney disease, stage 4 (severe): Secondary | ICD-10-CM | POA: Diagnosis present

## 2020-11-21 DIAGNOSIS — E1122 Type 2 diabetes mellitus with diabetic chronic kidney disease: Secondary | ICD-10-CM | POA: Diagnosis present

## 2020-11-21 DIAGNOSIS — Z8249 Family history of ischemic heart disease and other diseases of the circulatory system: Secondary | ICD-10-CM

## 2020-11-21 DIAGNOSIS — I5043 Acute on chronic combined systolic (congestive) and diastolic (congestive) heart failure: Secondary | ICD-10-CM

## 2020-11-21 DIAGNOSIS — Z66 Do not resuscitate: Secondary | ICD-10-CM | POA: Diagnosis present

## 2020-11-21 DIAGNOSIS — Z794 Long term (current) use of insulin: Secondary | ICD-10-CM

## 2020-11-21 DIAGNOSIS — D649 Anemia, unspecified: Secondary | ICD-10-CM | POA: Diagnosis present

## 2020-11-21 DIAGNOSIS — I4510 Unspecified right bundle-branch block: Secondary | ICD-10-CM | POA: Diagnosis present

## 2020-11-21 DIAGNOSIS — E118 Type 2 diabetes mellitus with unspecified complications: Secondary | ICD-10-CM | POA: Diagnosis not present

## 2020-11-21 DIAGNOSIS — J449 Chronic obstructive pulmonary disease, unspecified: Secondary | ICD-10-CM | POA: Diagnosis present

## 2020-11-21 DIAGNOSIS — I509 Heart failure, unspecified: Secondary | ICD-10-CM | POA: Diagnosis present

## 2020-11-21 DIAGNOSIS — I130 Hypertensive heart and chronic kidney disease with heart failure and stage 1 through stage 4 chronic kidney disease, or unspecified chronic kidney disease: Principal | ICD-10-CM | POA: Diagnosis present

## 2020-11-21 DIAGNOSIS — J9621 Acute and chronic respiratory failure with hypoxia: Secondary | ICD-10-CM | POA: Diagnosis not present

## 2020-11-21 DIAGNOSIS — E1165 Type 2 diabetes mellitus with hyperglycemia: Secondary | ICD-10-CM | POA: Diagnosis present

## 2020-11-21 DIAGNOSIS — Z79899 Other long term (current) drug therapy: Secondary | ICD-10-CM

## 2020-11-21 DIAGNOSIS — R269 Unspecified abnormalities of gait and mobility: Secondary | ICD-10-CM | POA: Diagnosis not present

## 2020-11-21 DIAGNOSIS — I272 Pulmonary hypertension, unspecified: Secondary | ICD-10-CM | POA: Diagnosis present

## 2020-11-21 DIAGNOSIS — G8929 Other chronic pain: Secondary | ICD-10-CM | POA: Diagnosis present

## 2020-11-21 DIAGNOSIS — IMO0002 Reserved for concepts with insufficient information to code with codable children: Secondary | ICD-10-CM | POA: Diagnosis present

## 2020-11-21 DIAGNOSIS — I959 Hypotension, unspecified: Secondary | ICD-10-CM | POA: Diagnosis not present

## 2020-11-21 DIAGNOSIS — Z6828 Body mass index (BMI) 28.0-28.9, adult: Secondary | ICD-10-CM

## 2020-11-21 DIAGNOSIS — E7800 Pure hypercholesterolemia, unspecified: Secondary | ICD-10-CM | POA: Diagnosis present

## 2020-11-21 DIAGNOSIS — Z87891 Personal history of nicotine dependence: Secondary | ICD-10-CM

## 2020-11-21 DIAGNOSIS — Z7901 Long term (current) use of anticoagulants: Secondary | ICD-10-CM

## 2020-11-21 DIAGNOSIS — J9611 Chronic respiratory failure with hypoxia: Secondary | ICD-10-CM | POA: Diagnosis present

## 2020-11-21 LAB — CBC WITH DIFFERENTIAL/PLATELET
Abs Immature Granulocytes: 0.03 10*3/uL (ref 0.00–0.07)
Basophils Absolute: 0 10*3/uL (ref 0.0–0.1)
Basophils Relative: 1 %
Eosinophils Absolute: 0.1 10*3/uL (ref 0.0–0.5)
Eosinophils Relative: 2 %
HCT: 32 % — ABNORMAL LOW (ref 39.0–52.0)
Hemoglobin: 9.2 g/dL — ABNORMAL LOW (ref 13.0–17.0)
Immature Granulocytes: 1 %
Lymphocytes Relative: 9 %
Lymphs Abs: 0.5 10*3/uL — ABNORMAL LOW (ref 0.7–4.0)
MCH: 25.9 pg — ABNORMAL LOW (ref 26.0–34.0)
MCHC: 28.8 g/dL — ABNORMAL LOW (ref 30.0–36.0)
MCV: 90.1 fL (ref 80.0–100.0)
Monocytes Absolute: 0.6 10*3/uL (ref 0.1–1.0)
Monocytes Relative: 11 %
Neutro Abs: 4.3 10*3/uL (ref 1.7–7.7)
Neutrophils Relative %: 76 %
Platelets: 393 10*3/uL (ref 150–400)
RBC: 3.55 MIL/uL — ABNORMAL LOW (ref 4.22–5.81)
RDW: 20.3 % — ABNORMAL HIGH (ref 11.5–15.5)
WBC: 5.6 10*3/uL (ref 4.0–10.5)
nRBC: 0.4 % — ABNORMAL HIGH (ref 0.0–0.2)

## 2020-11-21 LAB — I-STAT VENOUS BLOOD GAS, ED
Acid-Base Excess: 9 mmol/L — ABNORMAL HIGH (ref 0.0–2.0)
Bicarbonate: 35.2 mmol/L — ABNORMAL HIGH (ref 20.0–28.0)
Calcium, Ion: 1.02 mmol/L — ABNORMAL LOW (ref 1.15–1.40)
HCT: 32 % — ABNORMAL LOW (ref 39.0–52.0)
Hemoglobin: 10.9 g/dL — ABNORMAL LOW (ref 13.0–17.0)
O2 Saturation: 67 %
Potassium: 2.9 mmol/L — ABNORMAL LOW (ref 3.5–5.1)
Sodium: 140 mmol/L (ref 135–145)
TCO2: 37 mmol/L — ABNORMAL HIGH (ref 22–32)
pCO2, Ven: 58.1 mmHg (ref 44.0–60.0)
pH, Ven: 7.39 (ref 7.250–7.430)
pO2, Ven: 36 mmHg (ref 32.0–45.0)

## 2020-11-21 LAB — RESP PANEL BY RT-PCR (FLU A&B, COVID) ARPGX2
Influenza A by PCR: NEGATIVE
Influenza B by PCR: NEGATIVE
SARS Coronavirus 2 by RT PCR: NEGATIVE

## 2020-11-21 LAB — COMPREHENSIVE METABOLIC PANEL
ALT: 14 U/L (ref 0–44)
AST: 25 U/L (ref 15–41)
Albumin: 2.8 g/dL — ABNORMAL LOW (ref 3.5–5.0)
Alkaline Phosphatase: 109 U/L (ref 38–126)
Anion gap: 14 (ref 5–15)
BUN: 53 mg/dL — ABNORMAL HIGH (ref 8–23)
CO2: 29 mmol/L (ref 22–32)
Calcium: 8.5 mg/dL — ABNORMAL LOW (ref 8.9–10.3)
Chloride: 97 mmol/L — ABNORMAL LOW (ref 98–111)
Creatinine, Ser: 2.7 mg/dL — ABNORMAL HIGH (ref 0.61–1.24)
GFR, Estimated: 24 mL/min — ABNORMAL LOW (ref >60)
Glucose, Bld: 133 mg/dL — ABNORMAL HIGH (ref 70–99)
Potassium: 2.9 mmol/L — ABNORMAL LOW (ref 3.5–5.1)
Sodium: 140 mmol/L (ref 135–145)
Total Bilirubin: 0.6 mg/dL (ref 0.3–1.2)
Total Protein: 6.9 g/dL (ref 6.5–8.1)

## 2020-11-21 LAB — I-STAT CHEM 8, ED
BUN: 56 mg/dL — ABNORMAL HIGH (ref 8–23)
Calcium, Ion: 1.06 mmol/L — ABNORMAL LOW (ref 1.15–1.40)
Chloride: 95 mmol/L — ABNORMAL LOW (ref 98–111)
Creatinine, Ser: 2.6 mg/dL — ABNORMAL HIGH (ref 0.61–1.24)
Glucose, Bld: 129 mg/dL — ABNORMAL HIGH (ref 70–99)
HCT: 31 % — ABNORMAL LOW (ref 39.0–52.0)
Hemoglobin: 10.5 g/dL — ABNORMAL LOW (ref 13.0–17.0)
Potassium: 2.8 mmol/L — ABNORMAL LOW (ref 3.5–5.1)
Sodium: 139 mmol/L (ref 135–145)
TCO2: 33 mmol/L — ABNORMAL HIGH (ref 22–32)

## 2020-11-21 LAB — TROPONIN I (HIGH SENSITIVITY): Troponin I (High Sensitivity): 45 ng/L — ABNORMAL HIGH (ref <18)

## 2020-11-21 LAB — BRAIN NATRIURETIC PEPTIDE: B Natriuretic Peptide: 2475.4 pg/mL — ABNORMAL HIGH (ref 0.0–100.0)

## 2020-11-21 MED ORDER — LATANOPROST 0.005 % OP SOLN
1.0000 [drp] | Freq: Every day | OPHTHALMIC | Status: DC
  Administered 2020-11-22 – 2020-11-26 (×4): 1 [drp] via OPHTHALMIC
  Filled 2020-11-21: qty 2.5

## 2020-11-21 MED ORDER — ALBUTEROL SULFATE HFA 108 (90 BASE) MCG/ACT IN AERS: 2.0000 | INHALATION_SPRAY | Freq: Four times a day (QID) | RESPIRATORY_TRACT | Status: DC | PRN

## 2020-11-21 MED ORDER — FUROSEMIDE 10 MG/ML IJ SOLN
80.0000 mg | Freq: Two times a day (BID) | INTRAMUSCULAR | Status: DC
  Administered 2020-11-22 – 2020-11-23 (×3): 80 mg via INTRAVENOUS
  Filled 2020-11-21 (×3): qty 8

## 2020-11-21 MED ORDER — ACETAMINOPHEN 650 MG RE SUPP: 650.0000 mg | Freq: Four times a day (QID) | RECTAL | Status: DC | PRN

## 2020-11-21 MED ORDER — POTASSIUM CHLORIDE CRYS ER 20 MEQ PO TBCR
20.0000 meq | EXTENDED_RELEASE_TABLET | Freq: Two times a day (BID) | ORAL | Status: DC
  Administered 2020-11-22 – 2020-11-27 (×12): 20 meq via ORAL
  Filled 2020-11-21 (×12): qty 1

## 2020-11-21 MED ORDER — POTASSIUM CHLORIDE CRYS ER 20 MEQ PO TBCR
20.0000 meq | EXTENDED_RELEASE_TABLET | Freq: Once | ORAL | Status: AC
  Administered 2020-11-21: 20 meq via ORAL
  Filled 2020-11-21: qty 1

## 2020-11-21 MED ORDER — TAMSULOSIN HCL 0.4 MG PO CAPS
0.4000 mg | ORAL_CAPSULE | Freq: Every day | ORAL | Status: DC
  Administered 2020-11-22 – 2020-11-27 (×6): 0.4 mg via ORAL
  Filled 2020-11-21 (×6): qty 1

## 2020-11-21 MED ORDER — ACETAMINOPHEN 325 MG PO TABS: 650.0000 mg | ORAL_TABLET | Freq: Four times a day (QID) | ORAL | Status: DC | PRN

## 2020-11-21 MED ORDER — METOLAZONE 5 MG PO TABS
2.5000 mg | ORAL_TABLET | Freq: Every day | ORAL | Status: DC | PRN
Start: 2020-11-22 — End: 2020-11-28
  Filled 2020-11-21: qty 1

## 2020-11-21 MED ORDER — MAGNESIUM OXIDE 400 (241.3 MG) MG PO TABS
200.0000 mg | ORAL_TABLET | Freq: Every day | ORAL | Status: DC
  Administered 2020-11-22 – 2020-11-27 (×6): 200 mg via ORAL
  Filled 2020-11-21 (×6): qty 1

## 2020-11-21 MED ORDER — LIDOCAINE 5 % EX PTCH
1.0000 | MEDICATED_PATCH | CUTANEOUS | Status: DC
  Administered 2020-11-22 – 2020-11-25 (×4): 1 via TRANSDERMAL
  Filled 2020-11-21 (×5): qty 1

## 2020-11-21 MED ORDER — SIMETHICONE 80 MG PO CHEW
80.0000 mg | CHEWABLE_TABLET | Freq: Four times a day (QID) | ORAL | Status: DC | PRN
  Administered 2020-11-22 – 2020-11-24 (×4): 80 mg via ORAL
  Filled 2020-11-21 (×5): qty 1

## 2020-11-21 MED ORDER — FUROSEMIDE 10 MG/ML IJ SOLN
40.0000 mg | Freq: Once | INTRAMUSCULAR | Status: AC
  Administered 2020-11-21: 40 mg via INTRAVENOUS
  Filled 2020-11-21: qty 4

## 2020-11-21 MED ORDER — BUDESON-GLYCOPYRROL-FORMOTEROL 160-9-4.8 MCG/ACT IN AERO: 2.0000 | INHALATION_SPRAY | Freq: Two times a day (BID) | RESPIRATORY_TRACT | Status: DC

## 2020-11-21 MED ORDER — FAMOTIDINE 20 MG PO TABS
20.0000 mg | ORAL_TABLET | Freq: Two times a day (BID) | ORAL | Status: DC
  Administered 2020-11-22 – 2020-11-27 (×12): 20 mg via ORAL
  Filled 2020-11-21 (×12): qty 1

## 2020-11-21 MED ORDER — INSULIN ASPART 100 UNIT/ML ~~LOC~~ SOLN: 0.0000 [IU] | Freq: Three times a day (TID) | SUBCUTANEOUS | Status: DC

## 2020-11-21 MED ORDER — POTASSIUM CHLORIDE CRYS ER 20 MEQ PO TBCR
20.0000 meq | EXTENDED_RELEASE_TABLET | Freq: Once | ORAL | Status: AC
  Administered 2020-11-22: 20 meq via ORAL
  Filled 2020-11-21: qty 1

## 2020-11-21 MED ORDER — ATORVASTATIN CALCIUM 40 MG PO TABS
40.0000 mg | ORAL_TABLET | Freq: Every day | ORAL | Status: DC
  Administered 2020-11-22 – 2020-11-27 (×6): 40 mg via ORAL
  Filled 2020-11-21 (×6): qty 1

## 2020-11-21 MED ORDER — APIXABAN 2.5 MG PO TABS
2.5000 mg | ORAL_TABLET | Freq: Two times a day (BID) | ORAL | Status: DC
  Administered 2020-11-22 – 2020-11-27 (×12): 2.5 mg via ORAL
  Filled 2020-11-21 (×12): qty 1

## 2020-11-21 NOTE — ED Provider Notes (Cosign Needed Addendum)
[place] EMERGENCY DEPARTMENT Provider Note   CSN: [id number] Arrival date & time: 11/21/20  1933     History Chief Complaint  Patient presents with  . Shortness of Breath    Nicholas Caldwell is a 75 y.o. male with medical history significant of CAD, A. fib/flutter on Eliquis, CHF, hypertension, hyperlipidemia, pulmonary hypertension, CKD 4, COPD on 3 to 4 L at home, diabetes, OSA on CPAP, prolonged QT who presents with 2 days of worsening shortness of breath.  Patient was recently admitted for CHF exacerbation on December 28 last year, about 3 weeks ago.  Patient states that he was doing okay until the day before yesterday.  States that he started noticing increased swelling in his abdomen and lower extremities yesterday, worsened throughout today.  States that he has been compliant with all his medications including his torsemide.  Patient normally wears 3 to 4 L at home, when EMS found him he was at 84% on 4 L.  Satting at 99% on nonrebreather at 10 L.  Patient states that he feels short of breath, no chest pain.  Is not complaining of pain anywhere besides his back which is chronic.  No nausea or vomiting.  No abdominal pain.  Denies any lightheadedness or dizziness.  Denies any fevers or chills.  Denies any cough or URI symptoms.  Patient states that he has been vaccinated and boosted against COVID.  Denies any sick contacts.  Denies any triggers, states that he feels as if this is a CHF exacerbation.  Per chart review and patient was recently admitted for CHF exacerbation he was also noted to have severe hyperkalemia with peaked T waves.      Past Medical History:  Diagnosis Date  . Atrial flutter ([place])    a. recurrent AFlutter with RVR;  b. Amiodarone Rx started 4/16  . CAD (coronary artery disease)    a. LHC 1/16:  mLAD diffuse disease, pLCx mild disease, dLCx with disease but too small for PCI, RCA ok, EF 25-30%  . Chronic pain   . Chronic systolic CHF  (congestive heart failure) ([place])   . COPD (chronic obstructive pulmonary disease) ([place])   . Diabetes mellitus without complication ([place])   . Ex-smoker 11/2017  . Hypercholesteremia   . Hypertension   . NICM (nonischemic cardiomyopathy) ([place])    a.dx 2016. b. 2D echo 06/2016 - Last echo 07/01/16: mod dilated LV, mod LVH, EF 25-30%, mild-mod MR, sev LAE, mild-mod reduced RV systolic function, mild-mod TR, PASP 64mHG.  .Marland KitchenPAF (paroxysmal atrial fibrillation) (HCC)    On amio - ot a candidate for flecainide due to cardiomyopathy, not a candidate for Tikosyn due to prolonged QT, and felt to be a poor candidate for ablation given left atrial size.  . Pulmonary hypertension ([place]itol Heights   . Tobacco abuse     Patient Active Problem List   Diagnosis Date Noted  . Stage III pressure ulcer of sacral region (HSandy Level 10/01/2020  . Acute systolic CHF (congestive heart failure) ([facility]wall 09/25/2020  . Acute respiratory failure with hypercapnia (HReile's Acres   . Encounter for intubation   . Pressure injury of skin 08/21/2019  . Cardiac arrest ([place] 08/20/2019  . Palliative care by specialist   . DNR (do not resuscitate) discussion   . Fatigue   . Acute CHF (congestive heart failure) (HCaney City 07/07/2019  . Acute on chronic heart failure (HFortun[facility] Foothills 06/09/2019  . Lobar pneumonia (HE[place] 04/14/2019  . Hyperkalemia 04/10/2019  . Cerebral embolism with cerebral  infarction 03/21/2019  . Gastritis and gastroduodenitis   . NSVT (nonsustained ventricular tachycardia) ([place]) 03/08/2019  . Tobacco abuse counseling 03/08/2019  . Iron deficiency anemia   . Acute on chronic systolic CHF (congestive heart failure) ([place]) 03/06/2019  . Diabetes mellitus type 2 in obese ([place])   . Primary osteoarthritis of right hip   . Hypomagnesemia   . Steroid-induced hyperglycemia   . Supplemental oxygen dependent   . Leukocytosis   . Hypokalemia   . Acute on chronic anemia   . Diabetic peripheral neuropathy ([place])   . Acute on chronic systolic  (congestive) heart failure ([place])   . Acute on chronic respiratory failure ([place]) 01/14/2019  . Hypoxia   . Altered mental status   . RVF (right ventricular failure) ([place]) 12/30/2018  . AKI (acute kidney injury) ([place])   . Acute respiratory failure ([place]) 11/22/2018  . Acute on chronic respiratory failure with hypoxia and hypercapnia ([place]) 11/13/2018  . Metabolic encephalopathy 48/54/6270  . Acute respiratory failure with hypoxia and hypercapnia ([place]) 07/26/2018  . Acute exacerbation of CHF (congestive heart failure) ([place]) 07/26/2018  . CHF exacerbation ([place]) 05/01/2018  . CHF (congestive heart failure) ([place]) 04/30/2018  . Chronic respiratory failure with hypoxia ([place]) 12/28/2017  . Acute encephalopathy   . Atrial fibrillation with RVR ([place])   . SVT (supraventricular tachycardia) ([place])   . COPD GOLD 0   . Medically noncompliant   . Panlobular emphysema ([place])   . OSA (obstructive sleep apnea)   . Pulmonary hypertension ([place])   . Disorientation   . Pressure injury of skin 09/07/2016  . Acute on chronic respiratory failure with hypoxia ([place]) 09/05/2016  . Acute on chronic combined systolic and diastolic CHF (congestive heart failure) ([place]) 09/05/2016  . Skin lesion-left heal 09/05/2016  . Chronic systolic CHF (congestive heart failure) ([place]) 07/16/2016  . COPD (chronic obstructive pulmonary disease) ([place]) 07/16/2016  . Uncontrolled type 2 diabetes mellitus with complication ([place])   . Diabetic polyneuropathy associated with diabetes mellitus due to underlying condition ([place])   . Normocytic anemia 06/29/2016  . CAD - Non-obstructive by LHC1/16 01/21/2016  . Prolonged QT interval 10/09/2015  . Nonischemic cardiomyopathy ([place]) 10/09/2015  . PAF (paroxysmal atrial fibrillation) ([place])   . Chronic pain 02/19/2015  . Morbid obesity ([place]) 02/13/2015  . Dyspnea   . Elevated troponin I level 11/01/2014  . COPD exacerbation ([place]) 11/01/2014  . Essential hypertension 10/31/2014  . Type 2  diabetes mellitus with neuropathy 10/31/2014  . Hyperlipidemia  10/31/2014  . Cigarette smoker 10/31/2014    Past Surgical History:  Procedure Laterality Date  . BIOPSY  03/11/2019   Procedure: BIOPSY;  Surgeon: Lavena Bullion, DO;  Location: [facility];  Service: Gastroenterology;;  . BIOPSY  03/15/2019   Procedure: BIOPSY;  Surgeon: Irving Copas., MD;  Location: [facility];  Service: Gastroenterology;;  . CARDIOVERSION N/A 07/30/2018   Procedure: CARDIOVERSION;  Surgeon: Larey Dresser, MD;  Location: [facility] ENDOSCOPY;  Service: Cardiovascular;  Laterality: N/A;  . COLONOSCOPY N/A 03/11/2019   Procedure: COLONOSCOPY;  Surgeon: Lavena Bullion, DO;  Location: [facility];  Service: Gastroenterology;  Laterality: N/A;  . ENTEROSCOPY N/A 03/15/2019   Procedure: ENTEROSCOPY;  Surgeon: Rush Landmark Telford Nab., MD;  Location: [facility];  Service: Gastroenterology;  Laterality: N/A;  . ESOPHAGOGASTRODUODENOSCOPY Left 03/11/2019   Procedure: ESOPHAGOGASTRODUODENOSCOPY (EGD);  Surgeon: Lavena Bullion, DO;  Location: [facility] ENDOSCOPY;  Service: Gastroenterology;  Laterality: Left;  . LEFT AND RIGHT HEART CATHETERIZATION WITH CORONARY ANGIOGRAM  N/A 11/06/2014   Procedure: LEFT AND RIGHT HEART CATHETERIZATION WITH CORONARY ANGIOGRAM;  Surgeon: Jettie Booze, MD;  Location: [facility] CATH LAB;  Service: Cardiovascular;  Laterality: N/A;  . RIGHT/LEFT HEART CATH AND CORONARY ANGIOGRAPHY N/A 12/06/2018   Procedure: RIGHT/LEFT HEART CATH AND CORONARY ANGIOGRAPHY;  Surgeon: Larey Dresser, MD;  Location: [facility] CV LAB;  Service: Cardiovascular;  Laterality: N/A;  . SPLENECTOMY    . SUBMUCOSAL TATTOO INJECTION  03/15/2019   Procedure: SUBMUCOSAL TATTOO INJECTION;  Surgeon: Rush Landmark Telford Nab., MD;  Location: [facility] ENDOSCOPY;  Service: Gastroenterology;;       Family History  Problem Relation Age of Onset  . Heart disease Mother   . Hypertension Mother   . Heart failure Mother   .  Heart disease Father   . Hypertension Sister   . Heart attack Neg Hx   . Stroke Neg Hx   . Colon cancer Neg Hx   . Esophageal cancer Neg Hx     Social History   Tobacco Use  . Smoking status: Former Smoker    Packs/day: 1.00    Years: 34.00    Pack years: 34.00    Types: Cigarettes    Quit date: 11/30/2018    Years since quitting: 1.9  . Smokeless tobacco: Never Used  Vaping Use  . Vaping Use: Never used  Substance Use Topics  . Alcohol use: No    Alcohol/week: 0.0 standard drinks  . Drug use: No    Home Medications Prior to Admission medications   Medication Sig Start Date End Date Taking? Authorizing Provider  acetaminophen (TYLENOL) 325 MG tablet Take 2 tablets (650 mg total) by mouth every 4 (four) hours as needed for headache or mild pain. 10/09/20   Geradine Girt, DO  albuterol (PROVENTIL) (2.5 MG/3ML) 0.083% nebulizer solution Take 3 mLs (2.5 mg total) by nebulization every 4 (four) hours as needed for wheezing or shortness of breath. 06/21/20   Tanda Rockers, MD  albuterol (VENTOLIN HFA) 108 (90 Base) MCG/ACT inhaler Inhale 2 puffs into the lungs every 6 (six) hours as needed for wheezing or shortness of breath.    [provider]  amiodarone (PACERONE) 200 MG tablet Take 0.5 tablets (100 mg total) by mouth daily. 08/28/20   Larey Dresser, MD  apixaban (ELIQUIS) 2.5 MG TABS tablet Take 1 tablet (2.5 mg total) by mouth 2 (two) times daily. 11/01/20 01/30/21  Kayleen Memos, DO  ascorbic acid (VITAMIN C) 500 MG tablet Take 500 mg by mouth 3 (three) times a week. Monday Wednesday Friday    [provider]  atorvastatin (LIPITOR) 40 MG tablet Take 1 tablet (40 mg total) by mouth daily. 10/30/20   Larey Dresser, MD  Budeson-Glycopyrrol-Formoterol (BREZTRI AEROSPHERE) 160-9-4.8 MCG/ACT AERO Inhale 2 puffs into the lungs 2 (two) times daily. 06/21/20   Tanda Rockers, MD  carbamide peroxide (DEBROX) 6.5 % OTIC solution Place 5-10 drops into both ears  daily as needed (ear wax).    [provider]  Carboxymethylcellulose Sod PF 0.25 % SOLN Place 1 drop into both eyes 4 (four) times daily.    [provider]  cetirizine (ZYRTEC) 10 MG tablet Take 10 mg by mouth daily as needed for allergies.     [provider]  famotidine (PEPCID) 20 MG tablet TAKE 1 TABLET(20 MG) BY MOUTH TWICE DAILY Patient taking differently: Take 20 mg by mouth 2 (two) times daily. 10/29/20   Larey Dresser, MD  fluticasone (FLONASE) 50 MCG/ACT nasal spray Place 2 sprays into both nostrils daily as needed for allergies.     [provider]  hydrocortisone (ANUSOL-HC) 2.5 % rectal cream Place 1 application rectally 2 (two) times daily as needed for hemorrhoids or itching.     [provider]  hydrocortisone 1 % lotion Apply 1 application topically See admin instructions. Apply small amount to back twice daily for itchy rash as needed    [provider]  insulin aspart (NOVOLOG) 100 UNIT/ML injection Inject 0-15 Units into the skin 3 (three) times daily with meals. 10/09/20   Geradine Girt, DO  Insulin Syringe 27G X 1/2" 0.5 ML MISC 100 Syringes by Does not apply route 3 (three) times daily. 08/29/19   Hosie Poisson, MD  latanoprost (XALATAN) 0.005 % ophthalmic solution Place 1 drop into both eyes at bedtime.    [provider]  lidocaine (LIDODERM) 5 % Place 1 patch onto the skin daily. Remove & Discard patch within 12 hours or as directed by MD 10/10/20   Geradine Girt, DO  magnesium oxide (MAG-OX) 400 (241.3 Mg) MG tablet Take 0.5 tablets (200 mg total) by mouth daily. 11/01/20 12/01/20  Kayleen Memos, DO  metolazone (ZAROXOLYN) 2.5 MG tablet Take 2.5 mg by mouth See admin instructions. 1 tablet on Monday and Wednesday as needed  for weight gain of 3 pounds in 24 hours    [provider]  oxyCODONE-acetaminophen (PERCOCET) 10-325 MG tablet Take 1 tablet by mouth every 6 (six) hours as needed for pain.     [provider]  OXYGEN Inhale 3 L into the lungs continuous.     [provider]  potassium chloride SA (KLOR-CON) 20 MEQ tablet Take 1 tablet (20 mEq total) by mouth 2 (two) times daily. 11/09/20 12/09/20  Rafael Bihari, FNP  PRESCRIPTION MEDICATION Cpap    [provider]  silver sulfADIAZINE (SILVADENE) 1 % cream Apply topically daily. 11/01/20   Kayleen Memos, DO  simethicone (MYLICON) 80 MG chewable tablet Chew 1 tablet (80 mg total) by mouth every 6 (six) hours as needed for flatulence. 11/01/20   Kayleen Memos, DO  sodium chloride (OCEAN) 0.65 % SOLN nasal spray Place 2 sprays into both nostrils as needed for congestion.     [provider]  tamsulosin (FLOMAX) 0.4 MG CAPS capsule Take 1 capsule (0.4 mg total) by mouth daily. 10/09/20   Geradine Girt, DO  torsemide (DEMADEX) 20 MG tablet Take 4 tablets (80 mg total) by mouth 2 (two) times daily. 11/18/19   Larey Dresser, MD  zinc oxide 20 % ointment Apply 1 application topically 2 (two) times daily as needed for irritation (rash).     [provider]    Allergies    Bidil [isosorb dinitrate-hydralazine], Benazepril, Brimonidine, Metformin, and Morphine  Review of Systems   Review of Systems  Constitutional: Negative for chills, diaphoresis, fatigue and fever.  HENT: Negative for congestion, sore throat and trouble swallowing.   Eyes: Negative for pain and visual disturbance.  Respiratory: Positive for shortness of breath. Negative for cough and wheezing.   Cardiovascular: Positive for leg swelling. Negative for chest pain and palpitations.  Gastrointestinal: Negative for abdominal distention, abdominal pain, diarrhea, nausea and vomiting.  Genitourinary: Negative for difficulty urinating.  Musculoskeletal: Negative for back pain, neck pain and neck stiffness.  Skin: Negative for pallor.  Neurological: Negative for dizziness, speech difficulty, weakness and headaches.   Psychiatric/Behavioral: Negative  for confusion.    Physical Exam Updated Vital Signs BP (!) 117/59 (BP Location: Left Arm)   Pulse 77   Temp 99.1 F (37.3 C) (Oral)   Resp (!) 23   SpO2 100%   Physical Exam Constitutional:      General: He is in acute distress.     Appearance: Normal appearance. He is not ill-appearing, toxic-appearing or diaphoretic.     Comments: Patient satting at 99% on nonrebreather at 10 L, was able to turn down to 6 L and patient is still at 98%. Patient is speaking to me in short phrases, is tachypneic to 30. Is using accessory muscle use. Is handling her secretions.  HENT:     Mouth/Throat:     Mouth: Mucous membranes are moist.     Pharynx: Oropharynx is clear.  Eyes:     General: No scleral icterus.    Extraocular Movements: Extraocular movements intact.     Pupils: Pupils are equal, round, and reactive to light.  Cardiovascular:     Rate and Rhythm: Normal rate and regular rhythm.     Pulses: Normal pulses.     Heart sounds: Normal heart sounds.  Pulmonary:     Effort: Tachypnea, accessory muscle usage and respiratory distress present.     Breath sounds: No stridor. Rhonchi present. No wheezing or rales.  Chest:     Chest wall: No tenderness.  Abdominal:     General: Abdomen is flat. There is no distension.     Palpations: Abdomen is soft.     Tenderness: There is no abdominal tenderness. There is no guarding or rebound.  Musculoskeletal:        General: No swelling or tenderness. Normal range of motion.     Cervical back: Normal range of motion and neck supple. No rigidity.     Right lower leg: Edema present.     Left lower leg: Edema present.  Skin:    General: Skin is warm and dry.     Capillary Refill: Capillary refill takes less than 2 seconds.     Coloration: Skin is not pale.  Neurological:     General: No focal deficit present.     Mental Status: He is alert and oriented to person, place, and time.  Psychiatric:        Mood and  Affect: Mood normal.        Behavior: Behavior normal.     ED Results / Procedures / Treatments   Labs (all labs ordered are listed, but only abnormal results are displayed) Labs Reviewed  CBC WITH DIFFERENTIAL/PLATELET - Abnormal; Notable for the following components:      Result Value   RBC 3.55 (*)    Hemoglobin 9.2 (*)    HCT 32.0 (*)    MCH 25.9 (*)    MCHC 28.8 (*)    RDW 20.3 (*)    nRBC 0.4 (*)    Lymphs Abs 0.5 (*)    All other components within normal limits  BRAIN NATRIURETIC PEPTIDE - Abnormal; Notable for the following components:   B Natriuretic Peptide 2,475.4 (*)    All other components within normal limits  COMPREHENSIVE METABOLIC PANEL - Abnormal; Notable for the following components:   Potassium 2.9 (*)    Chloride 97 (*)    Glucose, Bld 133 (*)    BUN 53 (*)    Creatinine, Ser 2.70 (*)    Calcium 8.5 (*)    Albumin 2.8 (*)  GFR, Estimated 24 (*)    All other components within normal limits  I-STAT CHEM 8, ED - Abnormal; Notable for the following components:   Potassium 2.8 (*)    Chloride 95 (*)    BUN 56 (*)    Creatinine, Ser 2.60 (*)    Glucose, Bld 129 (*)    Calcium, Ion 1.06 (*)    TCO2 33 (*)    Hemoglobin 10.5 (*)    HCT 31.0 (*)    All other components within normal limits  I-STAT VENOUS BLOOD GAS, ED - Abnormal; Notable for the following components:   Bicarbonate 35.2 (*)    TCO2 37 (*)    Acid-Base Excess 9.0 (*)    Potassium 2.9 (*)    Calcium, Ion 1.02 (*)    HCT 32.0 (*)    Hemoglobin 10.9 (*)    All other components within normal limits  TROPONIN I (HIGH SENSITIVITY) - Abnormal; Notable for the following components:   Troponin I (High Sensitivity) 45 (*)    All other components within normal limits  RESP PANEL BY RT-PCR (FLU A&B, COVID) [id number]  TROPONIN I (HIGH SENSITIVITY)    EKG EKG Interpretation  Date/Time:  Wednesday November 21 2020 19:34:41 EST Ventricular Rate:  83 PR Interval:    QRS Duration: 133 QT  Interval:  371 QTC Calculation: 436 R Axis:   106 Text Interpretation: Sinus rhythm Prolonged PR interval Probable left atrial enlargement Right bundle branch block Nonspecific T abnormalities, lateral leads No significant change since 11/21 Confirmed by Aletta Edouard [phone]) on 11/21/2020 7:37:19 PM   Radiology DG Chest Port 1 View  Result Date: 11/21/2020 CLINICAL DATA:  Shortness of breath EXAM: PORTABLE CHEST 1 VIEW COMPARISON:  10/29/2020, 08/08/2020, 08/14/2019 FINDINGS: Cardiomegaly with vascular congestion. Increasing bilateral effusions and lower lung airspace disease. No pneumothorax IMPRESSION: Cardiomegaly with vascular congestion. Worsening bilateral effusions and left greater than right basilar airspace disease which may reflect pneumonia. Electronically Signed   By: Donavan Foil M.D.   On: 11/21/2020 20:17    Procedures .Critical Care Performed by: Alfredia Client, PA-C Authorized by: Alfredia Client, PA-C   Critical care provider statement:    Critical care time (minutes):  45   Critical care was necessary to treat or prevent imminent or life-threatening deterioration of the following conditions:  Respiratory failure   Critical care was time spent personally by me on the following activities:  Discussions with consultants, evaluation of patient's response to treatment, examination of patient, ordering and performing treatments and interventions, ordering and review of laboratory studies, ordering and review of radiographic studies, pulse oximetry, re-evaluation of patient's condition, obtaining history from patient or surrogate and review of old charts   (including critical care time)  Medications Ordered in ED Medications  potassium chloride SA (KLOR-CON) CR tablet 20 mEq (has no administration in time range)  furosemide (LASIX) injection 40 mg (40 mg Intravenous Given 11/21/20 2025)    ED Course  I have reviewed the triage vital signs and the nursing notes.  Pertinent  labs & imaging results that were available during my care of the patient were reviewed by me and considered in my medical decision making (see chart for details).  Clinical Course as of 11/21/20 2225  Wed Nov 21, 4064  3070 75 year old male with history of congestive heart failure here with worsening shortness of breath.  EMS had placed him on a nonrebreather for his hypoxia.  He is improving and resting comfortably in bed.  Asking  for food.  We will try to wean down his oxygen.  Will likely need admission for diuresis. [MB]    Clinical Course User Index [MB] Hayden Rasmussen, MD   MDM Rules/Calculators/A&P                         Nicholas Caldwell is a 75 y.o. male with medical history significant of CAD, A. fib/flutter on Eliquis, CHF, hypertension, hyperlipidemia, pulmonary hypertension, CKD 4, COPD on 3 to 4 L at home, diabetes, OSA on CPAP, prolonged QT who presents with 2 days of worsening shortness of breath. Patient does appear fluid overloaded, most likey CHF exacerbation, patient feel similarly. Will initiate Lasix and obtain labs. Also obtain VBG, patient is tachypneic and using sensory muscle use. May need to transition over to BiPAP of symptoms do not resolve.  Work-up today significant for BNP 2475.  CBC with hemoglobin of 9.2.  CMP with potassium of 2.9, creatinine of 2.7.  Will give potassium here in the emergency department.  Will only give small amount at this time since pt did have hyperkalemia during last admission. Most likely CHF exacerbation as discussed.  COVID pending.  Upon reassessment, patient states that he does not feel much better, however patient is now not using so much of his accessory muscle use, respirations 23, were 30-40.  We will have nursing de-escalate to nasal cannula at this time.  Spoke to Dr. Hal Hope, [facility] hospitalist who accept the patient.  The patient appears reasonably stabilized for admission considering the current resources, flow, and  capabilities available in the ED at this time, and I doubt any other [facility] requiring further screening and/or treatment in the ED prior to admission.  I discussed this case with my attending physician who cosigned this note including patient's presenting symptoms, physical exam, and planned diagnostics and interventions. Attending physician stated agreement with plan or made changes to plan which were implemented.   Attending physician assessed patient at bedside.  Final Clinical Impression(s) / ED Diagnoses Final diagnoses:  Acute on chronic congestive heart failure, unspecified heart failure type [facility])    Rx / DC Orders ED Discharge Orders    None           Alfredia Client, PA-C 11/21/20 2227    Hayden Rasmussen, MD 11/22/20 1215

## 2020-11-21 NOTE — ED Notes (Signed)
Gave critical result to MD [name]

## 2020-11-21 NOTE — ED Triage Notes (Signed)
PT BIB by [facility] for SOB and edema beginning last night and worsening today.  Pt has hx of CHF w/ previous exacerbation. Pt was found to be 88% on the 4L that he always wears at home.  PT iwas 99% on 10L NRB for EMS en route.  PT was afib on the monitor for EMS with hx of the same.

## 2020-11-21 NOTE — H&P (Addendum)
History and Physical    Nicholas Caldwell EEF:[id number] DOB: 06/18/1946 DOA: 11/21/2020  PCP: [name], MD  Patient coming from: Home.  Chief Complaint: Shortness of breath and lower extremity edema.  HPI: Nicholas Caldwell is a 75 y.o. male with known history of chronic diastolic CHF last EF measured was 50 to 55% and November 2021 with history of chronic kidney disease stage IV with nonobstructive CAD, A. fib, sleep apnea, COPD diabetes mellitus presents to the ER with worsening shortness of breath and lower extremity edema.  Patient states over the last 2 to 3 days patient became more short of breath and has been noticing increasing lower extremity edema.  Patient states he is usually bedbound.  Denies chest pain productive cough or fever chills.  ED Course: In the ER patient was noted to be hypoxic requiring 10 L nonrebreather on the way.  Chest x-ray shows features concerning for congestion versus pneumonia.  EKG shows normal sinus rhythm with RBBB.  Labs are significant for BNP of 2400 high sensitive troponin of 44 hemoglobin 9.2 and potassium of 2.9.  Patient was given potassium replacement and Lasix 40 mg IV admitted for acute on chronic CHF.  COVID test was negative.  Review of Systems: As per HPI, rest all negative.   Past Medical History:  Diagnosis Date  . Atrial flutter ([place])    a. recurrent AFlutter with RVR;  b. Amiodarone Rx started 4/16  . CAD (coronary artery disease)    a. LHC 1/16:  mLAD diffuse disease, pLCx mild disease, dLCx with disease but too small for PCI, RCA ok, EF 25-30%  . Chronic pain   . Chronic systolic CHF (congestive heart failure) ([place])   . COPD (chronic obstructive pulmonary disease) ([place])   . Diabetes mellitus without complication ([place])   . Ex-smoker 11/2017  . Hypercholesteremia   . Hypertension   . NICM (nonischemic cardiomyopathy) ([place])    a.dx 2016. b. 2D echo 06/2016 - Last echo 07/01/16: mod dilated LV, mod LVH, EF 25-30%, mild-mod MR, sev LAE,  mild-mod reduced RV systolic function, mild-mod TR, PASP 32mHG.  .Marland KitchenPAF (paroxysmal atrial fibrillation) (HCC)    On amio - ot a candidate for flecainide due to cardiomyopathy, not a candidate for Tikosyn due to prolonged QT, and felt to be a poor candidate for ablation given left atrial size.  . [place]monary [place]   . Tobacco abuse     Past Surgical History:  Procedure Laterality Date  . BIOPSY  03/11/2019   Procedure: BIOPSY;  Surgeon: CLavena Bullion DO;  [facility]  Service: Gastroenterology;;  . BIOPSY  03/15/2019   Procedure: BIOPSY;  Surgeon: MIrving Copas, MD;  [place] [facility]  Service: Gastroenterology;;  . CARDIOVERSION N/A 07/30/2018   Procedure: CARDIOVERSION;  Surgeon: MLarey Dresser MD;  [facility]  Service: Cardiovascular;  Laterality: N/A;  . COLONOSCOPY N/A 03/11/2019   Procedure: COLONOSCOPY;  Surgeon: CLavena Bullion DO;  [place] [facility]  Service: Gastroenterology;  Laterality: N/A;  . ENTEROSCOPY N/A 03/15/2019   Procedure: ENTEROSCOPY;  Surgeon: MRush LandmarkGTelford Nab, MD;  [place] [facility]  Service: Gastroenterology;  Laterality: N/A;  . ESOPHAGOGASTRODUODENOSCOPY Left 03/11/2019   Procedure: ESOPHAGOGASTRODUODENOSCOPY (EGD);  Surgeon: CLavena Bullion DO;  [facility]  Service: Gastroenterology;  Laterality: Left;  . LEFT AND RIGHT HEART CATHETERIZATION WITH CORONARY ANGIOGRAM N/A 11/06/2014   Procedure: LEFT AND RIGHT HEART CATHETERIZATION WITH CORONARY ANGIOGRAM;  Surgeon: JJettie Booze MD;  Location: [place] CATH LAB;  Service: Cardiovascular;  Laterality: N/A;  . RIGHT/LEFT HEART CATH AND CORONARY ANGIOGRAPHY N/A 12/06/2018   Procedure: RIGHT/LEFT HEART CATH AND CORONARY ANGIOGRAPHY;  Surgeon: Larey Dresser, MD;  Location: [place] CV LAB;  Service: Cardiovascular;  Laterality: N/A;  . SPLENECTOMY    . SUBMUCOSAL TATTOO INJECTION  03/15/2019   Procedure: SUBMUCOSAL TATTOO  INJECTION;  Surgeon: Rush Landmark Telford Nab., MD;  Location: [place];  Service: Gastroenterology;;     reports that he quit smoking about 1 years ago. His smoking use included cigarettes. He has a 34.00 pack-year smoking history. He has never used smokeless tobacco. He reports that he does not drink alcohol and does not use drugs.  Allergies  Allergen Reactions  . Bidil [Isosorb Dinitrate-Hydralazine] Shortness Of Breath  . Benazepril Nausea And Vomiting  . Brimonidine     Other reaction(s): Other (See Comments)  . Metformin     Other reaction(s): Cramps (ALLERGY/intolerance), Other (See Comments)  . Morphine Rash    Family History  Problem Relation Age of Onset  . Heart disease Mother   . Hypertension Mother   . Heart failure Mother   . Heart disease Father   . Hypertension Sister   . Heart attack Neg Hx   . Stroke Neg Hx   . Colon cancer Neg Hx   . Esophageal cancer Neg Hx     Prior to Admission medications   Medication Sig Start Date End Date Taking? Authorizing Provider  acetaminophen (TYLENOL) 325 MG tablet Take 2 tablets (650 mg total) by mouth every 4 (four) hours as needed for headache or mild pain. 10/09/20   Geradine Girt, DO  albuterol (PROVENTIL) (2.5 MG/3ML) 0.083% nebulizer solution Take 3 mLs (2.5 mg total) by nebulization every 4 (four) hours as needed for wheezing or shortness of breath. 06/21/20   Tanda Rockers, MD  albuterol (VENTOLIN HFA) 108 (90 Base) MCG/ACT inhaler Inhale 2 puffs into the lungs every 6 (six) hours as needed for wheezing or shortness of breath.    [provider]  amiodarone (PACERONE) 200 MG tablet Take 0.5 tablets (100 mg total) by mouth daily. 08/28/20   Larey Dresser, MD  apixaban (ELIQUIS) 2.5 MG TABS tablet Take 1 tablet (2.5 mg total) by mouth 2 (two) times daily. 11/01/20 01/30/21  Kayleen Memos, DO  ascorbic acid (VITAMIN C) 500 MG tablet Take 500 mg by mouth 3 (three) times a week. Monday Wednesday Friday     [provider]  atorvastatin (LIPITOR) 40 MG tablet Take 1 tablet (40 mg total) by mouth daily. 10/30/20   Larey Dresser, MD  Budeson-Glycopyrrol-Formoterol (BREZTRI AEROSPHERE) 160-9-4.8 MCG/ACT AERO Inhale 2 puffs into the lungs 2 (two) times daily. 06/21/20   Tanda Rockers, MD  carbamide peroxide (DEBROX) 6.5 % OTIC solution Place 5-10 drops into both ears daily as needed (ear wax).    [provider]  Carboxymethylcellulose Sod PF 0.25 % SOLN Place 1 drop into both eyes 4 (four) times daily.    [provider]  cetirizine (ZYRTEC) 10 MG tablet Take 10 mg by mouth daily as needed for allergies.     [provider]  famotidine (PEPCID) 20 MG tablet TAKE 1 TABLET(20 MG) BY MOUTH TWICE DAILY Patient taking differently: Take 20 mg by mouth 2 (two) times daily. 10/29/20   Larey Dresser, MD  fluticasone [facility]) 50 MCG/ACT nasal spray Place 2 sprays into both nostrils daily as needed for allergies.  [provider]  hydrocortisone (ANUSOL-HC) 2.5 % rectal cream Place 1 application rectally 2 (two) times daily as needed for hemorrhoids or itching.     [provider]  hydrocortisone 1 % lotion Apply 1 application topically See admin instructions. Apply small amount to back twice daily for itchy rash as needed    [provider]  insulin aspart (NOVOLOG) 100 UNIT/ML injection Inject 0-15 Units into the skin 3 (three) times daily with meals. 10/09/20   Geradine Girt, DO  Insulin Syringe 27G X 1/2" 0.5 ML MISC 100 Syringes by Does not apply route 3 (three) times daily. 08/29/19   Hosie Poisson, MD  latanoprost (XALATAN) 0.005 % ophthalmic solution Place 1 drop into both eyes at bedtime.    [provider]  lidocaine (LIDODERM) 5 % Place 1 patch onto the skin daily. Remove & Discard patch within 12 hours or as directed by MD 10/10/20   Geradine Girt, DO  magnesium oxide (MAG-OX) 400 (241.3 Mg) MG tablet Take 0.5 tablets (200  mg total) by mouth daily. 11/01/20 12/01/20  Kayleen Memos, DO  metolazone (ZAROXOLYN) 2.5 MG tablet Take 2.5 mg by mouth See admin instructions. 1 tablet on Monday and Wednesday as needed  for weight gain of 3 pounds in 24 hours    [provider]  oxyCODONE-acetaminophen (PERCOCET) 10-325 MG tablet Take 1 tablet by mouth every 6 (six) hours as needed for pain.    [provider]  OXYGEN Inhale 3 L into the lungs continuous.     [provider]  potassium chloride SA (KLOR-CON) 20 MEQ tablet Take 1 tablet (20 mEq total) by mouth 2 (two) times daily. 11/09/20 12/09/20  Rafael Bihari, FNP  PRESCRIPTION MEDICATION Cpap    [provider]  silver sulfADIAZINE (SILVADENE) 1 % cream Apply topically daily. 11/01/20   Kayleen Memos, DO  simethicone (MYLICON) 80 MG chewable tablet Chew 1 tablet (80 mg total) by mouth every 6 (six) hours as needed for flatulence. 11/01/20   Kayleen Memos, DO  sodium chloride (OCEAN) 0.65 % SOLN nasal spray Place 2 sprays into both nostrils as needed for congestion.     [provider]  tamsulosin (FLOMAX) 0.4 MG CAPS capsule Take 1 capsule (0.4 mg total) by mouth daily. 10/09/20   Geradine Girt, DO  torsemide (DEMADEX) 20 MG tablet Take 4 tablets (80 mg total) by mouth 2 (two) times daily. 11/18/19   Larey Dresser, MD  zinc oxide 20 % ointment Apply 1 application topically 2 (two) times daily as needed for irritation (rash).     [provider]    Physical Exam: Constitutional: Moderately built and nourished. Vitals:   11/21/20 2015 11/21/20 2034 11/21/20 2226 11/21/20 2229  BP: 102/64 (!) 117/59  (!) 124/52  Pulse: 78 77  74  Resp: 20 (!) 23  (!) 22  Temp:      TempSrc:      SpO2: 99% 100% (!) 86% 92%   Eyes: Anicteric no pallor. ENMT: No discharge from the ears eyes nose and mouth. Neck: JVD elevated no mass felt. Respiratory: No rhonchi or crepitations. Cardiovascular: S1-S2 heard. Abdomen: Soft  nontender bowel sounds present. Musculoskeletal: Bilateral lower extremity edema extending up to the thigh. Skin: No rash. Neurologic: Alert awake oriented to time place and person.  Moves all extremities. Psychiatric: Appears normal.  Normal affect.   Labs on Admission: I have personally reviewed following labs and imaging studies  CBC: Recent Labs  Lab 11/21/20 1944 11/21/20 2025 11/21/20 2026  WBC 5.6  --   --   NEUTROABS 4.3  --   --   HGB 9.2* 10.9* 10.5*  HCT 32.0* 32.0* 31.0*  MCV 90.1  --   --   PLT 393  --   --    Basic Metabolic Panel: Recent Labs  Lab 11/21/20 1944 11/21/20 2025 11/21/20 2026  NA 140 140 139  K 2.9* 2.9* 2.8*  CL 97*  --  95*  CO2 29  --   --   GLUCOSE 133*  --  129*  BUN 53*  --  56*  CREATININE 2.70*  --  2.60*  CALCIUM 8.5*  --   --    GFR: CrCl cannot be calculated (Unknown ideal weight.). Liver Function Tests: Recent Labs  Lab 11/21/20 1944  AST 25  ALT 14  ALKPHOS 109  BILITOT 0.6  PROT 6.9  ALBUMIN 2.8*   No results for input(s): LIPASE, AMYLASE in the last 168 hours. No results for input(s): AMMONIA in the last 168 hours. Coagulation Profile: No results for input(s): INR, PROTIME in the last 168 hours. Cardiac Enzymes: No results for input(s): CKTOTAL, CKMB, CKMBINDEX, TROPONINI in the last 168 hours. BNP (last 3 results) No results for input(s): PROBNP in the last 8760 hours. HbA1C: No results for input(s): HGBA1C in the last 72 hours. CBG: No results for input(s): GLUCAP in the last 168 hours. Lipid Profile: No results for input(s): CHOL, HDL, LDLCALC, TRIG, CHOLHDL, LDLDIRECT in the last 72 hours. Thyroid Function Tests: No results for input(s): TSH, T4TOTAL, FREET4, T3FREE, THYROIDAB in the last 72 hours. Anemia Panel: No results for input(s): VITAMINB12, FOLATE, FERRITIN, TIBC, IRON, RETICCTPCT in the last 72 hours. Urine analysis:    Component Value Date/Time   COLORURINE STRAW (A) 07/08/2019 0008    APPEARANCEUR CLEAR 07/08/2019 0008   LABSPEC 1.006 07/08/2019 0008   PHURINE 6.0 07/08/2019 0008   GLUCOSEU NEGATIVE 07/08/2019 0008   HGBUR NEGATIVE 07/08/2019 0008   BILIRUBINUR NEGATIVE 07/08/2019 0008   KETONESUR NEGATIVE 07/08/2019 0008   PROTEINUR NEGATIVE 07/08/2019 0008   UROBILINOGEN 0.2 10/26/2014 2206   NITRITE NEGATIVE 07/08/2019 0008   LEUKOCYTESUR NEGATIVE 07/08/2019 0008   Sepsis Labs: _0 (procalcitonin:4,lacticidven:4) ) Recent Results (from the past 240 hour(s))  Resp Panel by RT-PCR (Flu A&B, Covid) Nasopharyngeal Swab     Status: None   Collection Time: 11/21/20  7:45 PM   Specimen: Nasopharyngeal Swab; Nasopharyngeal(NP) swabs in vial transport medium  Result Value Ref Range Status   SARS Coronavirus 2 by RT PCR NEGATIVE NEGATIVE Final    Comment: (NOTE) SARS-CoV-2 target nucleic acids are NOT DETECTED.  The SARS-CoV-2 RNA is generally detectable in upper respiratory specimens during the acute phase of infection. The lowest concentration of SARS-CoV-2 viral copies this assay can detect is 138 copies/mL. A negative result does not preclude SARS-Cov-2 infection and should not be used as the sole basis for treatment or other patient management decisions. A negative result may occur with  improper specimen collection/handling, submission of specimen other than nasopharyngeal swab, presence of viral mutation(s) within the areas targeted by this assay, and inadequate number of viral copies(<138 copies/mL). A negative result must be combined with clinical observations, patient history, and epidemiological information. The expected result is Negative.  Fact Sheet for Patients:  EntrepreneurPulse.com.au  Fact Sheet for Healthcare Providers:  IncredibleEmployment.be  This test is no t yet approved or cleared by the [place]  FDA and  has been authorized for detection and/or diagnosis of SARS-CoV-2 by FDA under an  Emergency Use Authorization (EUA). This EUA will remain  in effect (meaning this test can be used) for the duration of the COVID-19 declaration under Section 564(b)(1) of the Act, 21 U.S.C.section 360bbb-3(b)(1), unless the authorization is terminated  or revoked sooner.       Influenza A by PCR NEGATIVE NEGATIVE Final   Influenza B by PCR NEGATIVE NEGATIVE Final    Comment: (NOTE) The Xpert Xpress SARS-CoV-2/FLU/RSV plus assay is intended as an aid in the diagnosis of influenza from Nasopharyngeal swab specimens and should not be used as a sole basis for treatment. Nasal washings and aspirates are unacceptable for Xpert Xpress SARS-CoV-2/FLU/RSV testing.  Fact Sheet for Patients: EntrepreneurPulse.com.au  Fact Sheet for Healthcare Providers: IncredibleEmployment.be  This test is not yet approved or cleared by the [place] FDA and has been authorized for detection and/or diagnosis of SARS-CoV-2 by FDA under an Emergency Use Authorization (EUA). This EUA will remain in effect (meaning this test can be used) for the duration of the COVID-19 declaration under Section 564(b)(1) of the Act, 21 U.S.C. section 360bbb-3(b)(1), unless the authorization is terminated or revoked.  Performed at [facility], [address], [place], [place] [address]      Radiological Exams on Admission: DG Chest Port 1 View  Result Date: 11/21/2020 CLINICAL DATA:  Shortness of breath EXAM: PORTABLE CHEST 1 VIEW COMPARISON:  10/29/2020, 08/08/2020, 08/14/2019 FINDINGS: Cardiomegaly with vascular congestion. Increasing bilateral effusions and lower lung airspace disease. No pneumothorax IMPRESSION: Cardiomegaly with vascular congestion. Worsening bilateral effusions and left greater than right basilar airspace disease which may reflect pneumonia. Electronically Signed   By: Donavan Foil M.D.   On: 11/21/2020 20:17    EKG: Independently reviewed.  Normal  sinus rhythm.  Assessment/Plan Principal Problem:   Acute on chronic combined systolic and diastolic CHF (congestive heart failure) (HCC) Active Problems:   PAF (paroxysmal atrial fibrillation) (HCC)   Nonischemic cardiomyopathy (HCC)   CAD - Non-obstructive by LHC1/16   Uncontrolled type 2 diabetes mellitus with complication (HCC)   Pulmonary hypertension (HCC)   COPD GOLD 0   Chronic respiratory failure with hypoxia (HCC)   Acute CHF (congestive heart failure) (HCC)   Stage III pressure ulcer of sacral region ([place])    1. Acute on chronic diastolic CHF last EF measured in November 2021 was 50 to 55%.  Patient received Lasix 40 mg IV in the ER with potassium replacement.  We will keep patient on Lasix 80 mg IV every 12 and closely follow metabolic panel intake output Daily weights.  Patient usually takes torsemide and metolazone.  We will continue metolazone.  Since there is some possibility of pneumonia will check procalcitonin and keep patient on doxycycline and if procalcitonin is negative discontinue antibiotics. 2. Paroxysmal atrial fibrillation on apixaban.  Rate controlled.  We need to verify if patient is on amiodarone. 3. Nonobstructive CAD denies any chest pain. 4. Diabetes mellitus type 2 we will keep patient on sliding scale coverage.  Last hemoglobin A1c was 6.7.  Verify home medications. 5. Sleep apnea on CPAP. 6. Chronic anemia follow CBC. 7. COPD not actively wheezing. 8. History of sacral decubitus ulcer. 9. History of chronic pain on oxycodone. 10. Chronic kidney disease stage IV creatinine appears to be at baseline. 11. History of pulmonary hypertension per the chart.  Since patient has acute respiratory failure initially requiring 10 L nonrebreather will  need close monitoring for any worsening in inpatient status.  Need to verify patient's home medications particularly insulin and amiodarone.   DVT prophylaxis: Apixaban. Code Status: Full code. Family  Communication: Discussed with patient. Disposition Plan: Home. Consults called: None. Admission status: Inpatient.   Rise Patience MD [facility] Pager [phone].  If 7PM-7AM, please contact night-coverage [email or web address] Password [facility]  11/21/2020, 10:46 PM

## 2020-11-22 ENCOUNTER — Other Ambulatory Visit (HOSPITAL_COMMUNITY): Payer: Self-pay | Admitting: Cardiology

## 2020-11-22 ENCOUNTER — Other Ambulatory Visit: Payer: Self-pay

## 2020-11-22 ENCOUNTER — Encounter (HOSPITAL_COMMUNITY): Payer: Self-pay | Admitting: Internal Medicine

## 2020-11-22 DIAGNOSIS — I5043 Acute on chronic combined systolic (congestive) and diastolic (congestive) heart failure: Secondary | ICD-10-CM

## 2020-11-22 LAB — GLUCOSE, CAPILLARY
Glucose-Capillary: 123 mg/dL — ABNORMAL HIGH (ref 70–99)
Glucose-Capillary: 118 mg/dL — ABNORMAL HIGH (ref 70–99)
Glucose-Capillary: 149 mg/dL — ABNORMAL HIGH (ref 70–99)
Glucose-Capillary: 194 mg/dL — ABNORMAL HIGH (ref 70–99)

## 2020-11-22 LAB — PROCALCITONIN: Procalcitonin: 0.36 ng/mL

## 2020-11-22 LAB — CBC
HCT: 29.8 % — ABNORMAL LOW (ref 39.0–52.0)
Hemoglobin: 8.8 g/dL — ABNORMAL LOW (ref 13.0–17.0)
MCH: 26 pg (ref 26.0–34.0)
MCHC: 29.5 g/dL — ABNORMAL LOW (ref 30.0–36.0)
MCV: 87.9 fL (ref 80.0–100.0)
Platelets: 358 10*3/uL (ref 150–400)
RBC: 3.39 MIL/uL — ABNORMAL LOW (ref 4.22–5.81)
RDW: 19.9 % — ABNORMAL HIGH (ref 11.5–15.5)
WBC: 5.5 10*3/uL (ref 4.0–10.5)
nRBC: 0.4 % — ABNORMAL HIGH (ref 0.0–0.2)

## 2020-11-22 LAB — BASIC METABOLIC PANEL
Anion gap: 13 (ref 5–15)
BUN: 49 mg/dL — ABNORMAL HIGH (ref 8–23)
CO2: 31 mmol/L (ref 22–32)
Calcium: 8.4 mg/dL — ABNORMAL LOW (ref 8.9–10.3)
Chloride: 97 mmol/L — ABNORMAL LOW (ref 98–111)
Creatinine, Ser: 2.51 mg/dL — ABNORMAL HIGH (ref 0.61–1.24)
GFR, Estimated: 27 mL/min — ABNORMAL LOW (ref >60)
Glucose, Bld: 155 mg/dL — ABNORMAL HIGH (ref 70–99)
Potassium: 3.1 mmol/L — ABNORMAL LOW (ref 3.5–5.1)
Sodium: 141 mmol/L (ref 135–145)

## 2020-11-22 LAB — MAGNESIUM: Magnesium: 2.3 mg/dL (ref 1.7–2.4)

## 2020-11-22 LAB — TSH: TSH: 1.723 u[IU]/mL (ref 0.350–4.500)

## 2020-11-22 LAB — TROPONIN I (HIGH SENSITIVITY): Troponin I (High Sensitivity): 50 ng/L — ABNORMAL HIGH (ref <18)

## 2020-11-22 MED ORDER — ORAL CARE MOUTH RINSE
15.0000 mL | Freq: Two times a day (BID) | OROMUCOSAL | Status: DC
  Administered 2020-11-22 – 2020-11-27 (×11): 15 mL via OROMUCOSAL

## 2020-11-22 MED ORDER — OXYCODONE-ACETAMINOPHEN 5-325 MG PO TABS
1.0000 | ORAL_TABLET | Freq: Four times a day (QID) | ORAL | Status: DC | PRN
  Administered 2020-11-22 – 2020-11-27 (×18): 1 via ORAL
  Filled 2020-11-22 (×18): qty 1

## 2020-11-22 MED ORDER — FLUTICASONE PROPIONATE 50 MCG/ACT NA SUSP
2.0000 | Freq: Every day | NASAL | Status: DC
  Administered 2020-11-22: 2 via NASAL
  Filled 2020-11-22: qty 16

## 2020-11-22 MED ORDER — POTASSIUM CHLORIDE CRYS ER 20 MEQ PO TBCR
40.0000 meq | EXTENDED_RELEASE_TABLET | Freq: Once | ORAL | Status: AC
  Administered 2020-11-22: 40 meq via ORAL
  Filled 2020-11-22: qty 2

## 2020-11-22 MED ORDER — SODIUM CHLORIDE 0.9 % IV SOLN
100.0000 mg | Freq: Two times a day (BID) | INTRAVENOUS | Status: DC
  Administered 2020-11-22 – 2020-11-24 (×5): 100 mg via INTRAVENOUS
  Filled 2020-11-22 (×6): qty 100

## 2020-11-22 MED ORDER — OXYCODONE-ACETAMINOPHEN 10-325 MG PO TABS: 1.0000 | ORAL_TABLET | Freq: Four times a day (QID) | ORAL | Status: DC | PRN

## 2020-11-22 MED ORDER — FLUTICASONE FUROATE-VILANTEROL 200-25 MCG/INH IN AEPB
1.0000 | INHALATION_SPRAY | Freq: Every day | RESPIRATORY_TRACT | Status: DC
  Administered 2020-11-23 – 2020-11-27 (×5): 1 via RESPIRATORY_TRACT
  Filled 2020-11-22 (×2): qty 28

## 2020-11-22 MED ORDER — FLUTICASONE PROPIONATE 50 MCG/ACT NA SUSP
2.0000 | Freq: Every day | NASAL | Status: DC | PRN
  Administered 2020-11-23 (×3): 2 via NASAL
  Filled 2020-11-22: qty 16

## 2020-11-22 MED ORDER — AMIODARONE HCL 200 MG PO TABS
100.0000 mg | ORAL_TABLET | Freq: Every day | ORAL | Status: DC
  Administered 2020-11-22 – 2020-11-27 (×6): 100 mg via ORAL
  Filled 2020-11-22 (×6): qty 1

## 2020-11-22 MED ORDER — UMECLIDINIUM BROMIDE 62.5 MCG/INH IN AEPB
1.0000 | INHALATION_SPRAY | Freq: Every day | RESPIRATORY_TRACT | Status: DC
  Administered 2020-11-23 – 2020-11-27 (×5): 1 via RESPIRATORY_TRACT
  Filled 2020-11-22 (×2): qty 7

## 2020-11-22 MED ORDER — ALBUTEROL SULFATE (2.5 MG/3ML) 0.083% IN NEBU: 2.5000 mg | INHALATION_SOLUTION | RESPIRATORY_TRACT | Status: DC | PRN

## 2020-11-22 MED ORDER — OXYCODONE HCL 5 MG PO TABS
10.0000 mg | ORAL_TABLET | Freq: Four times a day (QID) | ORAL | Status: DC | PRN
Start: 2020-11-22 — End: 2020-11-23
  Administered 2020-11-22 (×2): 10 mg via ORAL
  Filled 2020-11-22 (×3): qty 2

## 2020-11-22 MED ORDER — OXYCODONE HCL 5 MG PO TABS
5.0000 mg | ORAL_TABLET | Freq: Four times a day (QID) | ORAL | Status: DC | PRN
  Administered 2020-11-22 – 2020-11-27 (×20): 5 mg via ORAL
  Filled 2020-11-22 (×20): qty 1

## 2020-11-22 NOTE — Progress Notes (Signed)
11/22/20 0900  Assess: MEWS Score  Temp 99.1 F (37.3 C)  BP (!) 108/56  Pulse Rate 72  ECG Heart Rate 73  Resp (!) 26  Level of Consciousness Alert  SpO2 95 %  O2 Device Nasal Cannula  O2 Flow Rate (L/min) 5 L/min  Assess: MEWS Score  MEWS Temp 0  MEWS Systolic 0  MEWS Pulse 0  MEWS RR 2  MEWS LOC 0  MEWS Score 2  MEWS Score Color Yellow  Assess: if the MEWS score is Yellow or Red  Were vital signs taken at a resting state? Yes  Focused Assessment No change from prior assessment  Early Detection of Sepsis Score *See Row Information* Low  MEWS guidelines implemented *See Row Information* Yes  Treat  MEWS Interventions Escalated (See documentation below)  Pain Scale 0-10  Pain Score 8  Take Vital Signs  Increase Vital Sign Frequency  Yellow: Q 2hr X 2 then Q 4hr X 2, if remains yellow, continue Q 4hrs  Notify: Charge Nurse/RN  Name of Charge Nurse/RN Notified Nona Dell  Date Charge Nurse/RN Notified 11/22/20  Time Charge Nurse/RN Notified 0900  Document  Patient Outcome Other (Comment) (stable; remains on department; no change)  Progress note created (see row info) Yes

## 2020-11-22 NOTE — Progress Notes (Signed)
PROGRESS NOTE  Nicholas Caldwell FYT:[id number] DOB: September 02, 1946 DOA: 11/21/2020 PCP: [name], MD  Brief History   HPI: Nicholas Caldwell is a 75 y.o. male with known history of chronic diastolic CHF last EF measured was 50 to 55% and November 2021 with history of chronic kidney disease stage IV with nonobstructive CAD, A. fib, sleep apnea, COPD diabetes mellitus presents to the ER with worsening shortness of breath and lower extremity edema.  Patient states over the last 2 to 3 days patient became more short of breath and has been noticing increasing lower extremity edema.  Patient states he is usually bedbound.  Denies chest pain productive cough or fever chills.  The patient is on 3.5 - 4.0 L O2 by [place] at home.   ED Course: In the ER patient was noted to be hypoxic requiring 10 L nonrebreather on the way.  Chest x-ray shows features concerning for congestion versus pneumonia.  EKG shows normal sinus rhythm with RBBB.  Labs are significant for BNP of 2400 high sensitive troponin of 44 hemoglobin 9.2 and potassium of 2.9.  Patient was given potassium replacement and Lasix 40 mg IV admitted for acute on chronic CHF.  COVID test was negative.  [facility] were consulted to admit the patient for further evaluation and treatment.  The patient has been admitted to a telemetry bed. He is receiving IV lasix and IV doxycycline.   Consultants  . None  Procedures  . None  Antibiotics   Anti-infectives (From admission, onward)   Start     Dose/Rate Route Frequency Ordered Stop   11/22/20 0800  doxycycline (VIBRAMYCIN) 100 mg in sodium chloride 0.9 % 250 mL IVPB        100 mg 125 mL/hr over 120 Minutes Intravenous Every 12 hours 11/22/20 0630      .  Subjective  The patient is resting comfortably. He continues to be very short of breath. No new complaints.  Objective   Vitals:  Vitals:   11/22/20 1400 11/22/20 1500  BP: 111/67 (!) 131/51  Pulse: 79 88  Resp: (!) 28   Temp: 98.7 F  (37.1 C) 98.2 F (36.8 C)  SpO2: 97% 100%   Exam:  Constitutional:  . The patient is awake, alert, and oriented x 3. No acute distress. Respiratory:  . No increased work of breathing. . No wheezes or rhonchi . Positive for rales at bases bilaterally.  . No tactile fremitus Cardiovascular:  . Regular rate and rhythm . No murmurs, ectopy, or gallups. . No lateral PMI. No thrills. Abdomen:  . Abdomen is soft, non-tender, non-distended . No hernias, masses, or organomegaly . Normoactive bowel sounds.  Musculoskeletal:  . No cyanosis, clubbing, or edema Skin:  . No rashes, lesions, ulcers . palpation of skin: no induration or nodules Neurologic:  . CN 2-12 intact . Sensation all 4 extremities intact Psychiatric:  . Mental status o Mood, affect appropriate o Orientation to person, place, time  . judgment and insight appear intact  I have personally reviewed the following:   Today's Data  . Vitals, BMP, CBC, Procalcitonin, TSH  Imaging  . CXR  Cardiology Data  . EKG  Scheduled Meds: . apixaban  2.5 mg Oral BID  . atorvastatin  40 mg Oral Daily  . famotidine  20 mg Oral BID  . fluticasone furoate-vilanterol  1 puff Inhalation Daily   And  . umeclidinium bromide  1 puff Inhalation Daily  . furosemide  80 mg Intravenous Q12H  .  insulin aspart  0-6 Units Subcutaneous TID WC  . latanoprost  1 drop Both Eyes QHS  . lidocaine  1 patch Transdermal Q24H  . magnesium oxide  200 mg Oral Daily  . mouth rinse  15 mL Mouth Rinse BID  . potassium chloride SA  20 mEq Oral BID  . tamsulosin  0.4 mg Oral Daily   Continuous Infusions: . doxycycline (VIBRAMYCIN) IV Stopped (11/22/20 1114)    Principal Problem:   Acute on chronic combined systolic and diastolic CHF (congestive heart failure) (HCC) Active Problems:   PAF (paroxysmal atrial fibrillation) (HCC)   Nonischemic cardiomyopathy (HCC)   CAD - Non-obstructive by LHC1/16   Uncontrolled type 2 diabetes mellitus with  complication (HCC)   Pulmonary hypertension (HCC)   COPD GOLD 0   Chronic respiratory failure with hypoxia (HCC)   Acute CHF (congestive heart failure) (HCC)   Stage III pressure ulcer of sacral region ([place])   LOS: 1 day   A & P  Acute on chronic diastolic CHF last EF measured in November 2021 was 50 to 55%.  Patient received Lasix 40 mg IV in the ER with potassium replacement.  We will keep patient on Lasix 80 mg IV every 12 and closely follow metabolic panel intake output Daily weights.  Patient usually takes torsemide and metolazone.  We will continue metolazone.  Since there is some possibility of pneumonia will check procalcitonin and keep patient on doxycycline and if procalcitonin is negative discontinue antibiotics.  Acute on chronic hypoxic respiratory failure: The patient is on 3.0 - 4.0 liters of O2 by nasal cannula at home. He is currently requiring O2 5L by [place]. Upon presentation he was requiring 10L by NRB to maintain saturations in the 90's.  Paroxysmal atrial fibrillation:  Contineu apixaban and amiodarone..  Rate controlled.   Nonobstructive CAD:  Denies any chest pain.  Diabetes mellitus type 2 we will keep patient on sliding scale coverage.  Last hemoglobin A1c was 6.7.   Sleep apnea on CPAP.  Chronic anemia: Likely due to renal insufficiency. Monitor.  COPD:  Continue duonebs. He is not actively wheezing.  History of sacral decubitus ulcer.  History of chronic pain:  Continue oxycodone as at home on an as needed basis.  Chronic kidney disease stage IV:  creatinine appears to be at baseline. Monitor creatinine, electrolytes, and volume status. Avoid nephrotoxic medications and hypotension.  History of pulmonary hypertension:  Noted.  I have seen and examined this patient myself. I have spent 35 minutes in his evaluation and care.  DVT prophylaxis: Apixaban. Code Status: Full code. Family Communication: Discussed with patient. Disposition Plan: Home.   Eli Adami, DO [facility] Direct contact: see [email or web address]  7PM-7AM contact night coverage as above 11/22/2020, 3:54 PM  LOS: 1 day

## 2020-11-22 NOTE — Progress Notes (Signed)
Placed patient on home CPAP for the night with oxygen set at 4lpm.

## 2020-11-22 NOTE — Discharge Instructions (Signed)
Information on my medicine - ELIQUIS (apixaban)  Why was Eliquis prescribed for you? Eliquis was prescribed for you to reduce the risk of a blood clot forming that can cause a stroke if you have a medical condition called atrial fibrillation (a type of irregular heartbeat).  What do You need to know about Eliquis ? Take your Eliquis TWICE DAILY - one tablet in the morning and one tablet in the evening with or without food. If you have difficulty swallowing the tablet whole please discuss with your pharmacist how to take the medication safely.  Take Eliquis exactly as prescribed by your doctor and DO NOT stop taking Eliquis without talking to the doctor who prescribed the medication.  Stopping may increase your risk of developing a stroke.  Refill your prescription before you run out.  After discharge, you should have regular check-up appointments with your healthcare provider that is prescribing your Eliquis.  In the future your dose may need to be changed if your kidney function or weight changes by a significant amount or as you get older.  What do you do if you miss a dose? If you miss a dose, take it as soon as you remember on the same day and resume taking twice daily.  Do not take more than one dose of ELIQUIS at the same time to make up a missed dose.  Important Safety Information A possible side effect of Eliquis is bleeding. You should call your healthcare provider right away if you experience any of the following: ? Bleeding from an injury or your nose that does not stop. ? Unusual colored urine (red or dark brown) or unusual colored stools (red or black). ? Unusual bruising for unknown reasons. ? A serious fall or if you hit your head (even if there is no bleeding).  Some medicines may interact with Eliquis and might increase your risk of bleeding or clotting while on Eliquis. To help avoid this, consult your healthcare provider or pharmacist prior to using any new  prescription or non-prescription medications, including herbals, vitamins, non-steroidal anti-inflammatory drugs (NSAIDs) and supplements.  This website has more information on Eliquis (apixaban): http://[email or web address].com/eliquis/home

## 2020-11-23 LAB — CBC WITH DIFFERENTIAL/PLATELET
Abs Immature Granulocytes: 0.02 10*3/uL (ref 0.00–0.07)
Basophils Absolute: 0 10*3/uL (ref 0.0–0.1)
Basophils Relative: 1 %
Eosinophils Absolute: 0.2 10*3/uL (ref 0.0–0.5)
Eosinophils Relative: 3 %
HCT: 28.3 % — ABNORMAL LOW (ref 39.0–52.0)
Hemoglobin: 8.4 g/dL — ABNORMAL LOW (ref 13.0–17.0)
Immature Granulocytes: 0 %
Lymphocytes Relative: 14 %
Lymphs Abs: 0.7 10*3/uL (ref 0.7–4.0)
MCH: 26 pg (ref 26.0–34.0)
MCHC: 29.7 g/dL — ABNORMAL LOW (ref 30.0–36.0)
MCV: 87.6 fL (ref 80.0–100.0)
Monocytes Absolute: 0.9 10*3/uL (ref 0.1–1.0)
Monocytes Relative: 17 %
Neutro Abs: 3.5 10*3/uL (ref 1.7–7.7)
Neutrophils Relative %: 65 %
Platelets: 350 10*3/uL (ref 150–400)
RBC: 3.23 MIL/uL — ABNORMAL LOW (ref 4.22–5.81)
RDW: 19.9 % — ABNORMAL HIGH (ref 11.5–15.5)
WBC: 5.4 10*3/uL (ref 4.0–10.5)
nRBC: 0 % (ref 0.0–0.2)

## 2020-11-23 LAB — COMPREHENSIVE METABOLIC PANEL
ALT: 14 U/L (ref 0–44)
AST: 22 U/L (ref 15–41)
Albumin: 2.5 g/dL — ABNORMAL LOW (ref 3.5–5.0)
Alkaline Phosphatase: 90 U/L (ref 38–126)
Anion gap: 11 (ref 5–15)
BUN: 41 mg/dL — ABNORMAL HIGH (ref 8–23)
CO2: 32 mmol/L (ref 22–32)
Calcium: 8.3 mg/dL — ABNORMAL LOW (ref 8.9–10.3)
Chloride: 98 mmol/L (ref 98–111)
Creatinine, Ser: 2.11 mg/dL — ABNORMAL HIGH (ref 0.61–1.24)
GFR, Estimated: 33 mL/min — ABNORMAL LOW (ref >60)
Glucose, Bld: 158 mg/dL — ABNORMAL HIGH (ref 70–99)
Potassium: 3.5 mmol/L (ref 3.5–5.1)
Sodium: 141 mmol/L (ref 135–145)
Total Bilirubin: 0.6 mg/dL (ref 0.3–1.2)
Total Protein: 6.2 g/dL — ABNORMAL LOW (ref 6.5–8.1)

## 2020-11-23 LAB — GLUCOSE, CAPILLARY
Glucose-Capillary: 113 mg/dL — ABNORMAL HIGH (ref 70–99)
Glucose-Capillary: 142 mg/dL — ABNORMAL HIGH (ref 70–99)
Glucose-Capillary: 127 mg/dL — ABNORMAL HIGH (ref 70–99)
Glucose-Capillary: 138 mg/dL — ABNORMAL HIGH (ref 70–99)

## 2020-11-23 MED ORDER — TORSEMIDE 20 MG PO TABS
80.0000 mg | ORAL_TABLET | Freq: Two times a day (BID) | ORAL | Status: DC
  Administered 2020-11-23 – 2020-11-27 (×9): 80 mg via ORAL
  Filled 2020-11-23 (×9): qty 4

## 2020-11-23 MED ORDER — GLUCERNA SHAKE PO LIQD
237.0000 mL | Freq: Three times a day (TID) | ORAL | Status: DC
  Administered 2020-11-23 – 2020-11-27 (×6): 237 mL via ORAL

## 2020-11-23 NOTE — TOC Initial Note (Addendum)
Transition of Care [facility]) - Initial/Assessment Note    Patient Details  Name: Nicholas Caldwell MRN: [id number] Date of Birth: 1946-10-24  Transition of Care [facility]) CM/SW Contact:    [name], RN Phone Number: 11/23/2020, 1:19 PM  Clinical Narrative:  Risk for readmission assessment completed. Patient presented for acute on chronic CHF. Prior to arrival patient was from home active with [facility]-PT, OT, RN,  & Aide. Pt will need home health resumption orders and F2F once stable. Case Manager did call the Fees Coordinator, April Alexander to make the [place] aware that the pt was hospitalized. Case Manager will continue to follow for transition of care needs.              1725 11-23-20 [facility] is Dr. Marjo Bicker. CSW is Abundio Miu at [employer]) [phone] (O) [phone] ext [phone].   Expected Discharge Plan: [place] Barriers to Discharge: Continued Medical Work up   Patient Goals and CMS Choice Patient states their goals for this hospitalization and ongoing recovery are:: to return home      Expected Discharge Plan and Services Expected Discharge Plan: [place] In-house Referral: NA Discharge Planning Services: CM Consult Post Acute Care Choice: Home Health,Resumption of Svcs/PTA Provider    Prior Living Arrangements/Services     Patient language and need for interpreter reviewed:: Yes Do you feel safe going back to the place where you live?: Yes      Need for Family Participation in Patient Care: Yes (Comment) Care giver support system in place?: Yes (comment) Current home services: Home PT,Home OT,Home RN,Homehealth aide Criminal Activity/Legal Involvement Pertinent to Current Situation/Hospitalization: No - Comment as needed  Activities of Daily Living Home Assistive Devices/Equipment: Walker (specify type),Dentures (specify type),Cane (specify quad or straight) ADL Screening (condition  at time of admission) Patient's cognitive ability adequate to safely complete daily activities?: Yes Is the patient deaf or have difficulty hearing?: No Does the patient have difficulty seeing, even when wearing glasses/contacts?: No Does the patient have difficulty concentrating, remembering, or making decisions?: No Patient able to express need for assistance with ADLs?: Yes Does the patient have difficulty dressing or bathing?: Yes Independently performs ADLs?: No Communication: Independent Dressing (OT): Needs assistance Is this a change from baseline?: Pre-admission baseline Grooming: Needs assistance Is this a change from baseline?: Pre-admission baseline Feeding: Independent Toileting: Needs assistance Is this a change from baseline?: Pre-admission baseline In/Out Bed: Needs assistance Is this a change from baseline?: Pre-admission baseline Walks in Home: Needs assistance Is this a change from baseline?: Pre-admission baseline Does the patient have difficulty walking or climbing stairs?: Yes Weakness of Legs: Both Weakness of Arms/Hands: None  Permission Sought/Granted Permission sought to share information with : Family Estate agent Permission granted to share information with : Yes, Verbal Permission Granted              Emotional Assessment Appearance:: Appears stated age     Orientation: : Oriented to Situation,Oriented to  Time,Oriented to Place,Oriented to Self Alcohol / Substance Use: Not Applicable Psych Involvement: No (comment)  Admission diagnosis:  Acute CHF (congestive heart failure) ([place]) [I50.9] Acute on chronic congestive heart failure, unspecified heart failure type [facility]) [I50.9] Patient Active Problem List   Diagnosis Date Noted   Stage III pressure ulcer of sacral region ([place]) 72/53/6644   Acute systolic CHF (congestive heart failure) ([place]) 09/25/2020   Acute respiratory failure with hypercapnia ([place])  Encounter for intubation    Pressure injury of skin 08/21/2019   Cardiac arrest ([place]) 08/20/2019   Palliative care by specialist    DNR (do not resuscitate) discussion    Fatigue    Acute CHF (congestive heart failure) ([place]) 07/07/2019   Acute on chronic heart failure ([place]) 06/09/2019   Lobar pneumonia ([place]) 04/14/2019   Hyperkalemia 04/10/2019   Cerebral embolism with cerebral infarction 03/21/2019   Gastritis and gastroduodenitis    NSVT (nonsustained ventricular tachycardia) ([place]) 03/08/2019   Tobacco abuse counseling 03/08/2019   Iron deficiency anemia    Acute on chronic systolic CHF (congestive heart failure) ([place]) 03/06/2019   Diabetes mellitus type 2 in obese [facility])    Primary osteoarthritis of right hip    Hypomagnesemia    Steroid-induced hyperglycemia    Supplemental oxygen dependent    Leukocytosis    Hypokalemia    Acute on chronic anemia    Diabetic peripheral neuropathy (HCC)    Acute on chronic systolic (congestive) heart failure ([place])    Acute on chronic respiratory failure ([place]) 01/14/2019   Hypoxia    Altered mental status    RVF (right ventricular failure) ([place]) 12/30/2018   AKI (acute kidney injury) ([place])    Acute respiratory failure ([place]) 11/22/2018   Acute on chronic respiratory failure with hypoxia and hypercapnia (HCC) 38/17/7116   Metabolic encephalopathy 57/90/3833   Acute respiratory failure with hypoxia and hypercapnia (HCC) 07/26/2018   Acute exacerbation of CHF (congestive heart failure) ([place]) 07/26/2018   CHF exacerbation ([place]) 05/01/2018   CHF (congestive heart failure) ([place]) 04/30/2018   Chronic respiratory failure with hypoxia ([place]) 12/28/2017   Acute encephalopathy    Atrial fibrillation with RVR (HCC)    SVT (supraventricular tachycardia) ([place])    COPD GOLD 0    Medically noncompliant    Panlobular emphysema (HCC)    OSA (obstructive sleep apnea)    Pulmonary hypertension ([place])     Disorientation    Pressure injury of skin 09/07/2016   Acute on chronic respiratory failure with hypoxia ([place]) 09/05/2016   Acute on chronic combined systolic and diastolic CHF (congestive heart failure) ([place]) 09/05/2016   Skin lesion-left heal 38/32/9191   Chronic systolic CHF (congestive heart failure) ([place]) 07/16/2016   COPD (chronic obstructive pulmonary disease) ([place]) 07/16/2016   Uncontrolled type 2 diabetes mellitus with complication ([place])    Diabetic polyneuropathy associated with diabetes mellitus due to underlying condition ([place])    Normocytic anemia 06/29/2016   CAD - Non-obstructive by LHC1/16 01/21/2016   Prolonged QT interval 10/09/2015   Nonischemic cardiomyopathy ([place]) 10/09/2015   PAF (paroxysmal atrial fibrillation) (HCC)    Chronic pain 02/19/2015   Morbid obesity ([place]) 02/13/2015   Dyspnea    Elevated troponin I level 11/01/2014   COPD exacerbation ([place]) 11/01/2014   Essential hypertension 10/31/2014   Type 2 diabetes mellitus with neuropathy 10/31/2014   Hyperlipidemia  10/31/2014   Cigarette smoker 10/31/2014   PCP:  [name], MD Pharmacy:   [facility] [place], [place] [address] [place] [address] Phone: [phone] Fax: [place], [place] - [address] [place] [address] Phone: [phone] Fax: [phone]  [facility] [place], [place] - [address] [place] [address] Phone: [phone] Fax: [phone]  Readmission Risk Interventions Readmission Risk Prevention  Plan 11/23/2020 08/15/2019 06/13/2019  Transportation Screening Not Complete Complete Complete  Medication Review Press photographer) - Not Complete Complete  Med Review Comments - requested discharge medication  reconciliation by attending -  PCP or Specialist appointment within 3-5 days of discharge - Not Complete Complete  PCP/Specialist Appt Not Complete comments - [facility] informed of admit and will contact pt directly regarding follow up appt -  [place] or [place] - Complete Complete  SW Recovery Care/Counseling Consult - - Complete  [place] - - Not Applicable  Some recent data might be hidden

## 2020-11-23 NOTE — Progress Notes (Signed)
PROGRESS NOTE  Nicholas Caldwell DSK:[id number] DOB: 12-21-1945 DOA: 11/21/2020 PCP: [name], MD  Brief History   HPI: Nicholas Caldwell is a 75 y.o. male with known history of chronic diastolic CHF last EF measured was 50 to 55% and November 2021 with history of chronic kidney disease stage IV with nonobstructive CAD, A. fib, sleep apnea, COPD diabetes mellitus presents to the ER with worsening shortness of breath and lower extremity edema.  Patient states over the last 2 to 3 days patient became more short of breath and has been noticing increasing lower extremity edema.  Patient states he is usually bedbound.  Denies chest pain productive cough or fever chills.  The patient is on 3.5 - 4.0 L O2 by [place] at home.   ED Course: In the ER patient was noted to be hypoxic requiring 10 L nonrebreather on the way.  Chest x-ray shows features concerning for congestion versus pneumonia.  EKG shows normal sinus rhythm with RBBB.  Labs are significant for BNP of 2400 high sensitive troponin of 44 hemoglobin 9.2 and potassium of 2.9.  Patient was given potassium replacement and Lasix 40 mg IV admitted for acute on chronic CHF.  COVID test was negative.  [facility] were consulted to admit the patient for further evaluation and treatment.  The patient has been admitted to a telemetry bed. He is receiving IV lasix and IV doxycycline.   Consultants  . None  Procedures  . None  Antibiotics   Anti-infectives (From admission, onward)   Start     Dose/Rate Route Frequency Ordered Stop   11/22/20 0800  doxycycline (VIBRAMYCIN) 100 mg in sodium chloride 0.9 % 250 mL IVPB        100 mg 125 mL/hr over 120 Minutes Intravenous Every 12 hours 11/22/20 0630       Subjective  The patient is resting comfortably. He continues complain of shortness of breath, although he seems more comfortable except for some dyspnea with conversation.  Objective   Vitals:  Vitals:   11/23/20 1114 11/23/20 1200  BP: (!)  108/54 (!) 107/52  Pulse: 81 81  Resp: 18   Temp: 98.7 F (37.1 C)   SpO2: 95% 95%   Exam:  Constitutional:  . The patient is awake, alert, and oriented x 3. No acute distress. Respiratory:  . No increased work of breathing. . No wheezes, rales, or rhonchi . No tactile fremitus Cardiovascular:  . Regular rate and rhythm . No murmurs, ectopy, or gallups. . No lateral PMI. No thrills. Abdomen:  . Abdomen is soft, non-tender, non-distended . No hernias, masses, or organomegaly . Normoactive bowel sounds.  Musculoskeletal:  . No cyanosis, clubbing, or edema Skin:  . No rashes, lesions, ulcers . palpation of skin: no induration or nodules Neurologic:  . CN 2-12 intact . Sensation all 4 extremities intact Psychiatric:  . Mental status o Mood, affect appropriate o Orientation to person, place, time  . judgment and insight appear intact  I have personally reviewed the following:   Today's Data  . Vitals, BMP, CBC  Imaging  . CXR  Cardiology Data  . EKG  Scheduled Meds: . amiodarone  100 mg Oral Daily  . apixaban  2.5 mg Oral BID  . atorvastatin  40 mg Oral Daily  . famotidine  20 mg Oral BID  . feeding supplement (GLUCERNA SHAKE)  237 mL Oral TID BM  . fluticasone furoate-vilanterol  1 puff Inhalation Daily   And  . umeclidinium bromide  1 puff Inhalation Daily  . furosemide  80 mg Intravenous Q12H  . insulin aspart  0-6 Units Subcutaneous TID WC  . latanoprost  1 drop Both Eyes QHS  . lidocaine  1 patch Transdermal Q24H  . magnesium oxide  200 mg Oral Daily  . mouth rinse  15 mL Mouth Rinse BID  . potassium chloride SA  20 mEq Oral BID  . tamsulosin  0.4 mg Oral Daily   Continuous Infusions: . doxycycline (VIBRAMYCIN) IV 100 mg (11/23/20 0850)    Principal Problem:   Acute on chronic combined systolic and diastolic CHF (congestive heart failure) (HCC) Active Problems:   PAF (paroxysmal atrial fibrillation) (HCC)   Nonischemic cardiomyopathy (HCC)    CAD - Non-obstructive by LHC1/16   Uncontrolled type 2 diabetes mellitus with complication (HCC)   Pulmonary hypertension (HCC)   COPD GOLD 0   Chronic respiratory failure with hypoxia (HCC)   Acute CHF (congestive heart failure) (HCC)   Stage III pressure ulcer of sacral region ([place])   LOS: 2 days   A & P  Acute on chronic diastolic CHF last EF measured in November 2021 was 50 to 55%.  Patient received Lasix 40 mg IV in the ER with potassium replacement.  We will keep patient on Lasix 80 mg IV every 12 and closely follow metabolic panel intake output Daily weights.  Patient usually takes torsemide and metolazone.  We will continue metolazone.  Procalcitonin. Antibiotics were continued.  Acute on chronic hypoxic respiratory failure: The patient is on 3.0 - 4.0 liters of O2 by nasal cannula at home. He is currently requiring O2 5L by [place] to maintain saturations of 95%. Upon presentation he was requiring 10L by NRB to maintain saturations in the 90's.  Paroxysmal atrial fibrillation:  Continue apixaban and amiodarone. Heart rate is controlled.   Nonobstructive CAD:  Denies any chest pain.  Diabetes mellitus type 2 we will keep patient on sliding scale coverage.  Last hemoglobin A1c was 6.7. FSBS have been 113 to 194. The patient is receiving SSI.  Sleep apnea on CPAP.  Chronic anemia: Likely due to renal insufficiency. Monitor.  COPD:  Continue duonebs. He is not actively wheezing.  History of sacral decubitus ulcer.  History of chronic pain:  Continue oxycodone as at home on an as needed basis.  Chronic kidney disease stage IV:  creatinine appears to be at baseline. Monitor creatinine, electrolytes, and volume status. Avoid nephrotoxic medications and hypotension.  History of pulmonary hypertension:  Noted.  I have seen and examined this patient myself. I have spent 32 minutes in his evaluation and care.  DVT prophylaxis: Apixaban. Code Status: Full code. Family Communication:  None available Disposition Plan: Home.   Lakisa Lotz, DO [facility] Direct contact: see [email or web address]  7PM-7AM contact night coverage as above 11/23/2020, 4:42 PM  LOS: 1 day

## 2020-11-23 NOTE — Progress Notes (Signed)
Pt has home CPAP.  Pt is already on it.

## 2020-11-23 NOTE — Plan of Care (Signed)
Problem: Elimination: Goal: Will not experience complications related to bowel motility Outcome: Progressing   Problem: Pain Managment: Goal: General experience of comfort will improve Outcome: Progressing

## 2020-11-24 LAB — COMPREHENSIVE METABOLIC PANEL
ALT: 14 U/L (ref 0–44)
AST: 25 U/L (ref 15–41)
Albumin: 2.6 g/dL — ABNORMAL LOW (ref 3.5–5.0)
Alkaline Phosphatase: 85 U/L (ref 38–126)
Anion gap: 13 (ref 5–15)
BUN: 33 mg/dL — ABNORMAL HIGH (ref 8–23)
CO2: 35 mmol/L — ABNORMAL HIGH (ref 22–32)
Calcium: 8.4 mg/dL — ABNORMAL LOW (ref 8.9–10.3)
Chloride: 97 mmol/L — ABNORMAL LOW (ref 98–111)
Creatinine, Ser: 1.77 mg/dL — ABNORMAL HIGH (ref 0.61–1.24)
GFR, Estimated: 40 mL/min — ABNORMAL LOW (ref >60)
Glucose, Bld: 121 mg/dL — ABNORMAL HIGH (ref 70–99)
Potassium: 3.6 mmol/L (ref 3.5–5.1)
Sodium: 145 mmol/L (ref 135–145)
Total Bilirubin: 0.8 mg/dL (ref 0.3–1.2)
Total Protein: 6.5 g/dL (ref 6.5–8.1)

## 2020-11-24 LAB — GLUCOSE, CAPILLARY
Glucose-Capillary: 112 mg/dL — ABNORMAL HIGH (ref 70–99)
Glucose-Capillary: 119 mg/dL — ABNORMAL HIGH (ref 70–99)
Glucose-Capillary: 130 mg/dL — ABNORMAL HIGH (ref 70–99)

## 2020-11-24 MED ORDER — DOXYCYCLINE HYCLATE 100 MG PO TABS
100.0000 mg | ORAL_TABLET | Freq: Two times a day (BID) | ORAL | Status: DC
  Administered 2020-11-24 – 2020-11-27 (×6): 100 mg via ORAL
  Filled 2020-11-24 (×6): qty 1

## 2020-11-24 NOTE — Evaluation (Signed)
Physical Therapy Evaluation Patient Details Name: Nicholas Caldwell MRN: [id number] DOB: 12-11-1945 Today's Date: 11/24/2020   History of Present Illness  Ayren Zumbro is a 75 y.o. male with known history of chronic diastolic CHF last EF measured was 50 to 55% and November 2021 with history of chronic kidney disease stage IV with nonobstructive CAD, A. fib, sleep apnea, COPD diabetes mellitus presents to the ER with worsening shortness of breath and lower extremity edema.    Clinical Impression  Pt not very cooperative with PT.  I talked him into sitting EOB (with mod assist).  Pt weak in legs. He doesn't want to go SNF - wants to return home with [facility].  Wife not present to discuss - unsure how it was going at home from her standpoint prior to this.  Pt will be +2 for OOB activities.  I encouraged pt to continue with exercises and moving around. Pt will benefit from skilled PT if he will participate.      Follow Up Recommendations SNF;Home health PT;Supervision for mobility/OOB (Pt adamantly refuses the idea of going to SNF.  He wants to go back to his home with wife and HH.  Wife not available to discuss this with.  Pt is max assist with OOB)    Equipment Recommendations  None recommended by PT    Recommendations for Other Services       Precautions / Restrictions Precautions Precautions: Fall      Mobility  Bed Mobility Overal bed mobility: Needs Assistance Bed Mobility: Supine to Sit;Sit to Supine;Rolling Rolling: Supervision   Supine to sit: Mod assist Sit to supine: Mod assist   General bed mobility comments: Pt needed mod assist to sit EOB.  Pt denied dizziness.  Complaining of stomach ache and not wanting to do more    Transfers                 General transfer comment: Pt didnt want to get into chair - stomach hurts.  PT tried to get him to try to stand with RW - he refused.  Pt did scoot up along side of bed - with min assist form high bed - good push up from arms -  weaker in legs  Ambulation/Gait                Stairs            Wheelchair Mobility    Modified Rankin (Stroke Patients Only)       Balance                                             Pertinent Vitals/Pain Pain Assessment: 0-10 Pain Score: 6  Pain Location: back and hip pain    Home Living Family/patient expects to be discharged to:: Private residence Living Arrangements: Spouse/significant other Available Help at Discharge: Family;Available 24 hours/day Type of Home: House Home Access: Ramped entrance (pt reports he rides into his house on his power chair)       Home Equipment: Walker - 2 wheels;Wheelchair - manual;Bedside commode;Grab bars - tub/shower;Shower seat;Electric scooter;Walker - 4 wheels Additional Comments: On 3-4 L O2 at home.    Prior Function     Gait / Transfers Assistance Needed: Pt reports he has been staying in the bed and gets up with wifes assist.  He said he can get into the chair/wheelchair -  with help of wife.  He feels like he has been getting stronger with HH           Hand Dominance        Extremity/Trunk Assessment        Lower Extremity Assessment Lower Extremity Assessment: RLE deficits/detail;LLE deficits/detail RLE Deficits / Details: R leg 3/5 strength grossly LLE Deficits / Details: L leg 3-/5 strength grossly    Cervical / Trunk Assessment Cervical / Trunk Assessment: Kyphotic  Communication   Communication: No difficulties (Pt able to talk but not talkative - he was slow to respond and only answered questions intermittently)  Cognition Arousal/Alertness: Awake/alert Behavior During Therapy: Flat affect;Agitated Overall Cognitive Status: History of cognitive impairments - at baseline                                 General Comments: Pt very agitated initially and intermittently responding to questions.      General Comments General comments (skin integrity, edema,  etc.): Pt safe sitting EOB.  Educated him on waht a sliding board is - asked him to ask [place] to see if this would help him at home.    Exercises Total Joint Exercises Straight Leg Raises: AROM;10 reps;Both   Assessment/Plan    PT Assessment Patient needs continued PT services  PT Problem List Decreased strength;Decreased mobility;Decreased safety awareness;Decreased range of motion;Decreased knowledge of precautions;Decreased activity tolerance;Cardiopulmonary status limiting activity;Decreased balance;Decreased knowledge of use of DME;Pain       PT Treatment Interventions DME instruction;Therapeutic activities;Gait training;Patient/family education;Therapeutic exercise;Balance training;Functional mobility training    PT Goals (Current goals can be found in the Care Plan section)  Acute Rehab PT Goals Patient Stated Goal: To go home when i feel better PT Goal Formulation: With patient Time For Goal Achievement: 12/08/20 Potential to Achieve Goals: Fair    Frequency Min 3X/week   Barriers to discharge Decreased caregiver support      Co-evaluation               AM-PAC PT "6 Clicks" Mobility  Outcome Measure Help needed turning from your back to your side while in a flat bed without using bedrails?: A Little Help needed moving from lying on your back to sitting on the side of a flat bed without using bedrails?: A Lot Help needed moving to and from a bed to a chair (including a wheelchair)?: Total Help needed standing up from a chair using your arms (e.g., wheelchair or bedside chair)?: Total Help needed to walk in hospital room?: Total Help needed climbing 3-5 steps with a railing? : Total 6 Click Score: 9    End of Session Equipment Utilized During Treatment: Oxygen Activity Tolerance: Treatment limited secondary to agitation Patient left: in bed;with call bell/phone within reach;with nursing/sitter in room Nurse Communication: Mobility status PT Visit Diagnosis:  Unsteadiness on feet (R26.81);Muscle weakness (generalized) (M62.81);Difficulty in walking, not elsewhere classified (R26.2)    Time: 1450-1530 PT Time Calculation (min) (ACUTE ONLY): 40 min   Charges:   PT Evaluation $PT Eval Low Complexity: 1 Low PT Treatments $Therapeutic Exercise: 23-37 mins $Therapeutic Activity: 8-22 mins        11/24/2020   Rande Lawman, PT   Loyal Buba 11/24/2020, 3:46 PM

## 2020-11-24 NOTE — Progress Notes (Signed)
Patient placed himself on home CPAP for the night.

## 2020-11-24 NOTE — Progress Notes (Signed)
PROGRESS NOTE  [name] FXT:[id number] DOB: [birth year] DOA: 11/21/2020 PCP: [name], MD  Brief History   HPI: Nicholas Caldwell is a 75 y.o. male with known history of chronic diastolic CHF last EF measured was 50 to 55% and November 2021 with history of chronic kidney disease stage IV with nonobstructive CAD, A. fib, sleep apnea, COPD diabetes mellitus presents to the ER with worsening shortness of breath and lower extremity edema.  Patient states over the last 2 to 3 days patient became more short of breath and has been noticing increasing lower extremity edema.  Patient states he is usually bedbound.  Denies chest pain productive cough or fever chills.  The patient is on 3.5 - 4.0 L O2 by [place] at home.   ED Course: In the ER patient was noted to be hypoxic requiring 10 L nonrebreather on the way.  Chest x-ray shows features concerning for congestion versus pneumonia.  EKG shows normal sinus rhythm with RBBB.  Labs are significant for BNP of 2400 high sensitive troponin of 44 hemoglobin 9.2 and potassium of 2.9.  Patient was given potassium replacement and Lasix 40 mg IV admitted for acute on chronic CHF.  COVID test was negative.  [facility] were consulted to admit the patient for further evaluation and treatment.  The patient has been admitted to a telemetry bed. He is receiving IV lasix and IV doxycycline. His respiratory status is trending towards baseline. Creatinine is also trending down. The plan is for the patient to discharge to home with home health. PT/OT has been asked to evaluate the patient for disposition.  Consultants  . None  Procedures  . None  Antibiotics   Anti-infectives (From admission, onward)   Start     Dose/Rate Route Frequency Ordered Stop   11/22/20 0800  doxycycline (VIBRAMYCIN) 100 mg in sodium chloride 0.9 % 250 mL IVPB        100 mg 125 mL/hr over 120 Minutes Intravenous Every 12 hours 11/22/20 0630       Subjective  The patient is  resting comfortably. No new complaints.  Objective   Vitals:  Vitals:   11/24/20 0000 11/24/20 0320  BP:  132/64  Pulse:  83  Resp: 18 20  Temp: (!) 97.2 F (36.2 C) 98.8 F (37.1 C)  SpO2:  94%   Exam:  Constitutional:  . The patient is awake, alert, and oriented x 3. No acute distress. He is resting with CPAP on. Respiratory:  . No increased work of breathing. . No wheezes, rales, or rhonchi . No tactile fremitus Cardiovascular:  . Regular rate and rhythm . No murmurs, ectopy, or gallups. . No lateral PMI. No thrills. Abdomen:  . Abdomen is soft, non-tender, non-distended . No hernias, masses, or organomegaly . Normoactive bowel sounds.  Musculoskeletal:  . No cyanosis, clubbing, or edema Skin:  . No rashes, lesions, ulcers . palpation of skin: no induration or nodules Neurologic:  . CN 2-12 intact . Sensation all 4 extremities intact Psychiatric:  . Mental status o Mood, affect appropriate o Orientation to person, place, time  . judgment and insight appear intact  I have personally reviewed the following:   Today's Data  . Vitals, CMP  Imaging  . CXR  Cardiology Data  . EKG  Scheduled Meds: . amiodarone  100 mg Oral Daily  . apixaban  2.5 mg Oral BID  . atorvastatin  40 mg Oral Daily  . famotidine  20 mg Oral BID  . feeding  supplement (GLUCERNA SHAKE)  237 mL Oral TID BM  . fluticasone furoate-vilanterol  1 puff Inhalation Daily   And  . umeclidinium bromide  1 puff Inhalation Daily  . insulin aspart  0-6 Units Subcutaneous TID WC  . latanoprost  1 drop Both Eyes QHS  . lidocaine  1 patch Transdermal Q24H  . magnesium oxide  200 mg Oral Daily  . mouth rinse  15 mL Mouth Rinse BID  . potassium chloride SA  20 mEq Oral BID  . tamsulosin  0.4 mg Oral Daily  . torsemide  80 mg Oral BID   Continuous Infusions: . doxycycline (VIBRAMYCIN) IV 100 mg (11/24/20 0840)    Principal Problem:   Acute on chronic combined systolic and diastolic CHF  (congestive heart failure) (HCC) Active Problems:   PAF (paroxysmal atrial fibrillation) (HCC)   Nonischemic cardiomyopathy ([place])   CAD - Non-obstructive by LHC1/16   Uncontrolled type 2 diabetes mellitus with complication (HCC)   Pulmonary hypertension (HCC)   COPD GOLD 0   Chronic respiratory failure with hypoxia (HCC)   Acute CHF (congestive heart failure) (HCC)   Stage III pressure ulcer of sacral region ([place])   LOS: 3 days   A & P  Acute on chronic diastolic CHF last EF measured in November 2021 was 50 to 55%.  Patient received Lasix 40 mg IV in the ER with potassium replacement.  We will keep patient on Lasix 80 mg IV every 12 and closely follow metabolic panel intake output Daily weights.  Patient usually takes torsemide and metolazone.  We will continue metolazone.  Procalcitonin. Antibiotics were continued. The patient's fluid balance is negative 4.824 L as of today.   Acute on chronic hypoxic respiratory failure: The patient is on 3.0 - 4.0 liters of O2 by nasal cannula at home. He is currently requiring O2 5L by [place] to maintain saturations of 95%. Upon presentation he was requiring 10L by NRB to maintain saturations in the 90's. His respiratory status is trending towards baseline.  Paroxysmal atrial fibrillation:  Continue apixaban and amiodarone. Heart rate is controlled.   Nonobstructive CAD:  Denies any chest pain.  Diabetes mellitus type 2 we will keep patient on sliding scale coverage.  Last hemoglobin A1c was 6.7. FSBS have been 113 to 194. The patient is receiving SSI. FSBS has run between 112 to 194 in the last 24 hours. Monitor.  Sleep apnea on CPAP.  Chronic anemia: Likely due to renal insufficiency. Monitor. Hemoglobin is trending down at 8.4 today. Transfuse for less than 8.0.  COPD:  Continue duonebs. He is not actively wheezing.  History of sacral decubitus ulcer.  History of chronic pain:  Continue oxycodone as at home on an as needed basis.  Chronic kidney  disease stage IV:  creatinine appears to be at baseline. Monitor creatinine, electrolytes, and volume status. Avoid nephrotoxic medications and hypotension.  History of pulmonary hypertension:  Noted.  I have seen and examined this patient myself. I have spent 30 minutes in his evaluation and care.  DVT prophylaxis: Apixaban. Code Status: Full code. Family Communication: None available Disposition Plan: Home.   Danamarie Minami, DO [facility] Direct contact: see [email or web address]  7PM-7AM contact night coverage as above 11/24/2020, 12:59 PM  LOS: 1 day

## 2020-11-25 LAB — BASIC METABOLIC PANEL
Anion gap: 12 (ref 5–15)
BUN: 29 mg/dL — ABNORMAL HIGH (ref 8–23)
CO2: 37 mmol/L — ABNORMAL HIGH (ref 22–32)
Calcium: 8.9 mg/dL (ref 8.9–10.3)
Chloride: 94 mmol/L — ABNORMAL LOW (ref 98–111)
Creatinine, Ser: 1.84 mg/dL — ABNORMAL HIGH (ref 0.61–1.24)
GFR, Estimated: 38 mL/min — ABNORMAL LOW (ref >60)
Glucose, Bld: 116 mg/dL — ABNORMAL HIGH (ref 70–99)
Potassium: 3.7 mmol/L (ref 3.5–5.1)
Sodium: 143 mmol/L (ref 135–145)

## 2020-11-25 LAB — CBC WITH DIFFERENTIAL/PLATELET
Abs Immature Granulocytes: 0.02 10*3/uL (ref 0.00–0.07)
Basophils Absolute: 0.1 10*3/uL (ref 0.0–0.1)
Basophils Relative: 1 %
Eosinophils Absolute: 0.2 10*3/uL (ref 0.0–0.5)
Eosinophils Relative: 3 %
HCT: 30.2 % — ABNORMAL LOW (ref 39.0–52.0)
Hemoglobin: 8.9 g/dL — ABNORMAL LOW (ref 13.0–17.0)
Immature Granulocytes: 0 %
Lymphocytes Relative: 15 %
Lymphs Abs: 0.8 10*3/uL (ref 0.7–4.0)
MCH: 25.7 pg — ABNORMAL LOW (ref 26.0–34.0)
MCHC: 29.5 g/dL — ABNORMAL LOW (ref 30.0–36.0)
MCV: 87.3 fL (ref 80.0–100.0)
Monocytes Absolute: 0.9 10*3/uL (ref 0.1–1.0)
Monocytes Relative: 17 %
Neutro Abs: 3.3 10*3/uL (ref 1.7–7.7)
Neutrophils Relative %: 64 %
Platelets: 357 10*3/uL (ref 150–400)
RBC: 3.46 MIL/uL — ABNORMAL LOW (ref 4.22–5.81)
RDW: 19.9 % — ABNORMAL HIGH (ref 11.5–15.5)
WBC: 5.2 10*3/uL (ref 4.0–10.5)
nRBC: 0 % (ref 0.0–0.2)

## 2020-11-25 LAB — GLUCOSE, CAPILLARY
Glucose-Capillary: 103 mg/dL — ABNORMAL HIGH (ref 70–99)
Glucose-Capillary: 108 mg/dL — ABNORMAL HIGH (ref 70–99)
Glucose-Capillary: 131 mg/dL — ABNORMAL HIGH (ref 70–99)
Glucose-Capillary: 147 mg/dL — ABNORMAL HIGH (ref 70–99)

## 2020-11-25 NOTE — Progress Notes (Addendum)
PROGRESS NOTE  Nicholas Caldwell FAO:[id number] DOB: October 22, 1946 DOA: 11/21/2020 PCP: [name], MD  Brief History   HPI: Nicholas Caldwell is a 75 y.o. male with known history of chronic diastolic CHF last EF measured was 50 to 55% and November 2021 with history of chronic kidney disease stage IV with nonobstructive CAD, A. fib, sleep apnea, COPD diabetes mellitus presents to the ER with worsening shortness of breath and lower extremity edema.  Patient states over the last 2 to 3 days patient became more short of breath and has been noticing increasing lower extremity edema.  Patient states he is usually bedbound.  Denies chest pain productive cough or fever chills.  The patient is on 3.5 - 4.0 L O2 by [place] at home.    ED Course: In the ER patient was noted to be hypoxic requiring 10 L nonrebreather on the way.  Chest x-ray shows features concerning for congestion versus pneumonia.  EKG shows normal sinus rhythm with RBBB.  Labs are significant for BNP of 2400 high sensitive troponin of 44 hemoglobin 9.2 and potassium of 2.9.  Patient was given potassium replacement and Lasix 40 mg IV admitted for acute on chronic CHF.  COVID test was negative.  [facility] were consulted to admit the patient for further evaluation and treatment.  The patient has been admitted to a telemetry bed. He is receiving IV lasix and IV doxycycline. His respiratory status is trending towards baseline. Creatinine is also trending down. The plan is for the patient to discharge to home with home health as is his preference, although the patient is max assist for OOB. PT/OT have recommended SNF.  Consultants  None  Procedures  None  Antibiotics   Anti-infectives (From admission, onward)    Start     Dose/Rate Route Frequency Ordered Stop   11/24/20 2000  doxycycline (VIBRA-TABS) tablet 100 mg        100 mg Oral Every 12 hours 11/24/20 1330     11/22/20 0800  doxycycline (VIBRAMYCIN) 100 mg in sodium chloride 0.9 %  250 mL IVPB  Status:  Discontinued        100 mg 125 mL/hr over 120 Minutes Intravenous Every 12 hours 11/22/20 0630 11/24/20 1330      Subjective  The patient is resting comfortably. No new complaints.  Objective   Vitals:  Vitals:   11/25/20 0700 11/25/20 0907  BP: (!) 120/56   Pulse: 76 74  Resp: 15 18  Temp: 98 F (36.7 C)   SpO2:  96%   Exam:  Constitutional:  The patient is awake, alert, and oriented x 3. No acute distress. No new complaints. Respiratory:  No increased work of breathing. No wheezes, rales, or rhonchi No tactile fremitus Cardiovascular:  Regular rate and rhythm No murmurs, ectopy, or gallups. No lateral PMI. No thrills. Abdomen:  Abdomen is soft, non-tender, non-distended No hernias, masses, or organomegaly Normoactive bowel sounds.  Musculoskeletal:  No cyanosis, clubbing, or edema Skin:  No rashes, lesions, ulcers palpation of skin: no induration or nodules Neurologic:  CN 2-12 intact Sensation all 4 extremities intact Psychiatric:  Mental status Mood, affect appropriate Orientation to person, place, time  judgment and insight appear intact  I have personally reviewed the following:   Today's Data  Vitals, BMP, CBC  Imaging  CXR  Cardiology Data  EKG  Scheduled Meds:  amiodarone  100 mg Oral Daily   apixaban  2.5 mg Oral BID   atorvastatin  40 mg Oral Daily  doxycycline  100 mg Oral Q12H   famotidine  20 mg Oral BID   feeding supplement (GLUCERNA SHAKE)  237 mL Oral TID BM   fluticasone furoate-vilanterol  1 puff Inhalation Daily   And   umeclidinium bromide  1 puff Inhalation Daily   insulin aspart  0-6 Units Subcutaneous TID WC   latanoprost  1 drop Both Eyes QHS   lidocaine  1 patch Transdermal Q24H   magnesium oxide  200 mg Oral Daily   mouth rinse  15 mL Mouth Rinse BID   potassium chloride SA  20 mEq Oral BID   tamsulosin  0.4 mg Oral Daily   torsemide  80 mg Oral BID   Continuous Infusions:     Principal Problem:   Acute on chronic combined systolic and diastolic CHF (congestive heart failure) (HCC) Active Problems:   PAF (paroxysmal atrial fibrillation) (HCC)   Nonischemic cardiomyopathy (HCC)   CAD - Non-obstructive by LHC1/16   Uncontrolled type 2 diabetes mellitus with complication (HCC)   Pulmonary hypertension (HCC)   COPD GOLD 0   Chronic respiratory failure with hypoxia (HCC)   Acute CHF (congestive heart failure) (HCC)   Stage III pressure ulcer of sacral region ([place])   LOS: 4 days   A & P  Acute on chronic diastolic CHF last EF measured in November 2021 was 50 to 55%.  Patient received Lasix 40 mg IV in the ER with potassium replacement.  We will keep patient on Lasix 80 mg IV every 12 and closely follow metabolic panel intake output Daily weights.  Patient usually takes torsemide and metolazone.  We will continue metolazone.  Procalcitonin. Antibiotics were continued. The patient's fluid balance is negative 9.134 L as of today.   Acute on chronic hypoxic respiratory failure: The patient is on 3.0 - 4.0 liters of O2 by nasal cannula at home. He is currently requiring O2 5L by  to maintain saturations of 96%. Upon presentation he was requiring 10L by NRB to maintain saturations in the 90's. His respiratory status is trending towards baseline.  Paroxysmal atrial fibrillation:  Continue apixaban and amiodarone. Heart rate is controlled.   Nonobstructive CAD:  Denies any chest pain.  Diabetes mellitus type 2, uncontrolled with hyperglycemia: The patient on sliding scale coverage.  Last hemoglobin A1c was 6.7. FSBS have been 113 to 194. The patient is receiving SSI. FSBS has run between 103 to 138 in the last 24 hours. Monitor.  Sleep apnea on CPAP.  Chronic anemia: Likely due to renal insufficiency. Monitor. Hemoglobin is trending down at 8.4 today. Transfuse for less than 8.0.  COPD:  Continue duonebs. He is not actively wheezing.  History of sacral decubitus  ulcer.  History of chronic pain:  Continue oxycodone as at home on an as needed basis.  Chronic kidney disease stage IV:  creatinine appears to be at baseline. Monitor creatinine, electrolytes, and volume status. Avoid nephrotoxic medications and hypotension.  History of pulmonary hypertension:  Noted.  I have seen and examined this patient myself. I have spent 32 minutes in his evaluation and care.   DVT prophylaxis: Apixaban. Code Status: Full code. Family Communication: None available Disposition Plan: Home.    Jon, DO [facility] Direct contact: see [email or web address]  7PM-7AM contact night coverage as above 11/25/2020, 12:59 PM  LOS: 1 day

## 2020-11-25 NOTE — Progress Notes (Signed)
Patient placed himself on home CPAP unit for the night with oxygen set at 5lpm.

## 2020-11-26 LAB — GLUCOSE, CAPILLARY
Glucose-Capillary: 105 mg/dL — ABNORMAL HIGH (ref 70–99)
Glucose-Capillary: 119 mg/dL — ABNORMAL HIGH (ref 70–99)
Glucose-Capillary: 117 mg/dL — ABNORMAL HIGH (ref 70–99)
Glucose-Capillary: 114 mg/dL — ABNORMAL HIGH (ref 70–99)

## 2020-11-26 NOTE — TOC Initial Note (Addendum)
Transition of Care [facility]) - Initial/Assessment Note    Patient Details  Name: Nicholas Caldwell MRN: [id number] Date of Birth: [birth year]  Transition of Care [facility]) CM/SW Contact:    Trula Ore, [place] Phone Number: 11/26/2020, 9:16 AM  Clinical Narrative:         CSW received consult for possible SNF placement at time of discharge. CSW spoke with patient regarding PT recommendation of SNF placement at time of discharge.Patient comes from home with spouse.Patient expressed understanding of PT recommendation and has denied SNF placement at time of discharge. Patient wants to go home with home health services . Patient has received the COVID vaccines. No further questions reported at this time. CSW to continue to follow and assist with discharge planning needs.            Expected Discharge Plan: [place] Barriers to Discharge: Continued Medical Work up   Patient Goals and CMS Choice Patient states their goals for this hospitalization and ongoing recovery are:: to go home with [facility] services CMS Medicare.gov Compare Post Acute Care list provided to:: Patient Choice offered to / list presented to : Patient  Expected Discharge Plan and Services Expected Discharge Plan: [place] In-house Referral: NA Discharge Planning Services: CM Consult Post Acute Care Choice: Home Health,Resumption of Svcs/PTA Provider Living arrangements for the past 2 months: Single Family Home                                      Prior Living Arrangements/Services Living arrangements for the past 2 months: Single Family Home Lives with:: Self,Spouse Patient language and need for interpreter reviewed:: Yes Do you feel safe going back to the place where you live?: Yes      Need for Family Participation in Patient Care: Yes (Comment) Care giver support system in place?: Yes (comment) Current home services: Home PT,Home OT,Home RN,Homehealth aide Criminal  Activity/Legal Involvement Pertinent to Current Situation/Hospitalization: No - Comment as needed  Activities of Daily Living Home Assistive Devices/Equipment: Walker (specify type),Dentures (specify type),Cane (specify quad or straight) ADL Screening (condition at time of admission) Patient's cognitive ability adequate to safely complete daily activities?: Yes Is the patient deaf or have difficulty hearing?: No Does the patient have difficulty seeing, even when wearing glasses/contacts?: No Does the patient have difficulty concentrating, remembering, or making decisions?: No Patient able to express need for assistance with ADLs?: Yes Does the patient have difficulty dressing or bathing?: Yes Independently performs ADLs?: No Communication: Independent Dressing (OT): Needs assistance Is this a change from baseline?: Pre-admission baseline Grooming: Needs assistance Is this a change from baseline?: Pre-admission baseline Feeding: Independent Toileting: Needs assistance Is this a change from baseline?: Pre-admission baseline In/Out Bed: Needs assistance Is this a change from baseline?: Pre-admission baseline Walks in Home: Needs assistance Is this a change from baseline?: Pre-admission baseline Does the patient have difficulty walking or climbing stairs?: Yes Weakness of Legs: Both Weakness of Arms/Hands: None  Permission Sought/Granted Permission sought to share information with : Case Manager,Family Chief Financial Officer Permission granted to share information with : Yes, Verbal Permission Granted     Permission granted to share info w AGENCY: HH        Emotional Assessment Appearance:: Appears stated age Attitude/Demeanor/Rapport: Gracious Affect (typically observed): Calm Orientation: : Oriented to Self,Oriented to Place,Oriented to  Time,Oriented to Situation Alcohol / Substance Use: Not  Applicable Psych Involvement: No (comment)  Admission diagnosis:   Acute CHF (congestive heart failure) (HCC) [I50.9] Acute on chronic congestive heart failure, unspecified heart failure type [facility]) [I50.9] Patient Active Problem List   Diagnosis Date Noted  . Stage III pressure ulcer of sacral region ([place]) 10/01/2020  . Acute systolic CHF (congestive heart failure) ([place]) 09/25/2020  . Acute respiratory failure with hypercapnia ([place])   . Encounter for intubation   . Pressure injury of skin 08/21/2019  . Cardiac arrest ([place]) 08/20/2019  . Palliative care by specialist   . DNR (do not resuscitate) discussion   . Fatigue   . Acute CHF (congestive heart failure) ([place]) 07/07/2019  . Acute on chronic heart failure ([place]) 06/09/2019  . Lobar pneumonia ([place]) 04/14/2019  . Hyperkalemia 04/10/2019  . Cerebral embolism with cerebral infarction 03/21/2019  . Gastritis and gastroduodenitis   . NSVT (nonsustained ventricular tachycardia) ([place]) 03/08/2019  . Tobacco abuse counseling 03/08/2019  . Iron deficiency anemia   . Acute on chronic systolic CHF (congestive heart failure) ([place]) 03/06/2019  . Diabetes mellitus type 2 in obese ([place])   . Primary osteoarthritis of right hip   . Hypomagnesemia   . Steroid-induced hyperglycemia   . Supplemental oxygen dependent   . Leukocytosis   . Hypokalemia   . Acute on chronic anemia   . Diabetic peripheral neuropathy ([place])   . Acute on chronic systolic (congestive) heart failure ([place])   . Acute on chronic respiratory failure ([place]) 01/14/2019  . Hypoxia   . Altered mental status   . RVF (right ventricular failure) ([place]) 12/30/2018  . AKI (acute kidney injury) ([place])   . Acute respiratory failure ([place]) 11/22/2018  . Acute on chronic respiratory failure with hypoxia and hypercapnia ([place]) 11/13/2018  . Metabolic encephalopathy 94/17/4081  . Acute respiratory failure with hypoxia and hypercapnia ([place]) 07/26/2018  . Acute exacerbation of CHF (congestive heart failure) ([place]) 07/26/2018  . CHF exacerbation ([place]) 05/01/2018   . CHF (congestive heart failure) ([place]) 04/30/2018  . Chronic respiratory failure with hypoxia ([place]) 12/28/2017  . Acute encephalopathy   . Atrial fibrillation with RVR ([place])   . SVT (supraventricular tachycardia) ([place])   . COPD GOLD 0   . Medically noncompliant   . Panlobular emphysema ([place])   . OSA (obstructive sleep apnea)   . Pulmonary hypertension ([place])   . Disorientation   . Pressure injury of skin 09/07/2016  . Acute on chronic respiratory failure with hypoxia ([place]) 09/05/2016  . Acute on chronic combined systolic and diastolic CHF (congestive heart failure) ([place]) 09/05/2016  . Skin lesion-left heal 09/05/2016  . Chronic systolic CHF (congestive heart failure) ([place]) 07/16/2016  . COPD (chronic obstructive pulmonary disease) ([place]) 07/16/2016  . Uncontrolled type 2 diabetes mellitus with complication ([place])   . Diabetic polyneuropathy associated with diabetes mellitus due to underlying condition ([place])   . Normocytic anemia 06/29/2016  . CAD - Non-obstructive by LHC1/16 01/21/2016  . Prolonged QT interval 10/09/2015  . Nonischemic cardiomyopathy ([place]) 10/09/2015  . PAF (paroxysmal atrial fibrillation) ([place])   . Chronic pain 02/19/2015  . Morbid obesity ([place]) 02/13/2015  . Dyspnea   . Elevated troponin I level 11/01/2014  . COPD exacerbation ([place]) 11/01/2014  . Essential hypertension 10/31/2014  . Type 2 diabetes mellitus with neuropathy 10/31/2014  . Hyperlipidemia  10/31/2014  . Cigarette smoker 10/31/2014   PCP:  [name], MD Pharmacy:   [facility] [phone] - [place], [place] [address] AT [address] [place] [address] Phone: [phone] Fax: [place], [place] - [place] [address] [place] [address] Phone: [phone] Fax: [phone]  [facility] [place], [place] - [address] [place] [address] Phone: [phone] Fax: [phone]     Social Determinants of Health (SDOH) Interventions    Readmission Risk Interventions Readmission Risk Prevention Plan 11/23/2020 08/15/2019 06/13/2019  Transportation Screening Complete Complete Complete  Medication Review (RN Care Manager) Complete Not Complete Complete  Med Review Comments - requested discharge medication reconciliation by attending -  PCP or Specialist appointment within 3-5 days of discharge - Not Complete Complete  PCP/Specialist Appt Not Complete comments - [facility] informed of admit and will contact pt directly regarding follow up appt -  [place] or Home Care Consult Complete Complete Complete  SW Recovery Care/Counseling Consult Complete - Complete  Palliative Care Screening Not Applicable - Not [place] Not Applicable - Not Applicable  Some recent data might be hidden

## 2020-11-26 NOTE — Progress Notes (Signed)
PROGRESS NOTE  Nicholas Caldwell LGX:[id number] DOB: 19-Mar-1946 DOA: 11/21/2020 PCP: [name], MD  Brief History   HPI: Nicholas Caldwell is a 75 y.o. male with known history of chronic diastolic CHF last EF measured was 50 to 55% and November 2021 with history of chronic kidney disease stage IV with nonobstructive CAD, A. fib, sleep apnea, COPD diabetes mellitus presents to the ER with worsening shortness of breath and lower extremity edema.  Patient states over the last 2 to 3 days patient became more short of breath and has been noticing increasing lower extremity edema.  Patient states he is usually bedbound.  Denies chest pain productive cough or fever chills.  The patient is on 3.5 - 4.0 L O2 by [place] at home.    ED Course: In the ER patient was noted to be hypoxic requiring 10 L nonrebreather on the way.  Chest x-ray shows features concerning for congestion versus pneumonia.  EKG shows normal sinus rhythm with RBBB.  Labs are significant for BNP of 2400 high sensitive troponin of 44 hemoglobin 9.2 and potassium of 2.9.  Patient was given potassium replacement and Lasix 40 mg IV admitted for acute on chronic CHF.  COVID test was negative.  [facility] were consulted to admit the patient for further evaluation and treatment.  The patient has been admitted to a telemetry bed. He is receiving IV lasix and IV doxycycline. His respiratory status is trending towards baseline. Creatinine is also trending down. The plan is for the patient to discharge to home with home health as is his preference, although the patient is max assist for OOB. PT/OT have recommended SNF.  The patient is currently saturating in the low 90's on 5L. Weaning as possible.   Consultants  . None  Procedures  . None  Antibiotics   Anti-infectives (From admission, onward)   Start     Dose/Rate Route Frequency Ordered Stop   11/24/20 2000  doxycycline (VIBRA-TABS) tablet 100 mg        100 mg Oral Every 12 hours  11/24/20 1330     11/22/20 0800  doxycycline (VIBRAMYCIN) 100 mg in sodium chloride 0.9 % 250 mL IVPB  Status:  Discontinued        100 mg 125 mL/hr over 120 Minutes Intravenous Every 12 hours 11/22/20 0630 11/24/20 1330     Subjective  The patient is resting comfortably. No new complaints.  Objective   Vitals:  Vitals:   11/26/20 1132 11/26/20 1627  BP: 105/81 117/62  Pulse: 81 78  Resp: 20 20  Temp: 99.2 F (37.3 C) 99.9 F (37.7 C)  SpO2: 92% 95%   Exam:  Constitutional:  . The patient is awake, alert, and oriented x 3. No acute distress. No new complaints. Respiratory:  . No increased work of breathing. . No wheezes, rales, or rhonchi . No tactile fremitus Cardiovascular:  . Regular rate and rhythm . No murmurs, ectopy, or gallups. . No lateral PMI. No thrills. Abdomen:  . Abdomen is soft, non-tender, non-distended . No hernias, masses, or organomegaly . Normoactive bowel sounds.  Musculoskeletal:  . No cyanosis, clubbing, or edema Skin:  . No rashes, lesions, ulcers . palpation of skin: no induration or nodules Neurologic:  . CN 2-12 intact . Sensation all 4 extremities intact Psychiatric:  . Mental status o Mood, affect appropriate o Orientation to person, place, time  . judgment and insight appear intact  I have personally reviewed the following:   Today's Data  .  Vitals, BMP, CBC  Imaging  . CXR  Cardiology Data  . EKG  Scheduled Meds: . amiodarone  100 mg Oral Daily  . apixaban  2.5 mg Oral BID  . atorvastatin  40 mg Oral Daily  . doxycycline  100 mg Oral Q12H  . famotidine  20 mg Oral BID  . feeding supplement (GLUCERNA SHAKE)  237 mL Oral TID BM  . fluticasone furoate-vilanterol  1 puff Inhalation Daily   And  . umeclidinium bromide  1 puff Inhalation Daily  . insulin aspart  0-6 Units Subcutaneous TID WC  . latanoprost  1 drop Both Eyes QHS  . lidocaine  1 patch Transdermal Q24H  . magnesium oxide  200 mg Oral Daily  . mouth  rinse  15 mL Mouth Rinse BID  . potassium chloride SA  20 mEq Oral BID  . tamsulosin  0.4 mg Oral Daily  . torsemide  80 mg Oral BID   Continuous Infusions:   Principal Problem:   Acute on chronic combined systolic and diastolic CHF (congestive heart failure) (HCC) Active Problems:   PAF (paroxysmal atrial fibrillation) (HCC)   Nonischemic cardiomyopathy (HCC)   CAD - Non-obstructive by LHC1/16   Uncontrolled type 2 diabetes mellitus with complication (HCC)   Pulmonary hypertension (HCC)   COPD GOLD 0   Chronic respiratory failure with hypoxia (HCC)   Acute CHF (congestive heart failure) (HCC)   Stage III pressure ulcer of sacral region ([place])   LOS: 5 days   A & P  Acute on chronic diastolic CHF last EF measured in November 2021 was 50 to 55%.  Patient received Lasix 40 mg IV in the ER with potassium replacement.  We will keep patient on Lasix 80 mg IV every 12 and closely follow metabolic panel intake output Daily weights.  Patient usually takes torsemide and metolazone.  We will continue metolazone.  Procalcitonin. Antibiotics were continued. The patient's fluid balance is negative 12.964 L as of today.   Acute on chronic hypoxic respiratory failure: The patient is on 3.0 - 4.0 liters of O2 by nasal cannula at home. He is currently requiring O2 5L by [place] to maintain saturations of 96%. Upon presentation he was requiring 10L by NRB to maintain saturations in the 90's. His respiratory status is trending towards baseline.  Paroxysmal atrial fibrillation:  Continue apixaban and amiodarone. Heart rate is controlled.   Nonobstructive CAD:  Denies any chest pain.  Diabetes mellitus type 2, uncontrolled with hyperglycemia: The patient on sliding scale coverage.  Last hemoglobin A1c was 6.7. FSBS have been 113 to 194. The patient is receiving SSI. FSBS has run between 103 to 138 in the last 24 hours. Monitor.  Sleep apnea on CPAP.  Chronic anemia: Likely due to renal insufficiency.  Monitor. Hemoglobin is trending down at 8.4 today. Transfuse for less than 8.0.  COPD:  Continue duonebs. He is not actively wheezing.  History of sacral decubitus ulcer.  History of chronic pain:  Continue oxycodone as at home on an as needed basis.  Chronic kidney disease stage IV:  creatinine appears to be at baseline. Monitor creatinine, electrolytes, and volume status. Avoid nephrotoxic medications and hypotension.  History of pulmonary hypertension:  Noted.  I have seen and examined this patient myself. I have spent 30 minutes in his evaluation and care.   DVT prophylaxis: Apixaban. Code Status: Full code. Family Communication: None available Disposition Plan: Home.   Breigh Annett, DO [facility] Direct contact: see [email or web address]  7PM-7AM  contact night coverage as above 11/26/2020, 5:05 PM  LOS: 1 day

## 2020-11-26 NOTE — Evaluation (Signed)
Occupational Therapy Evaluation Patient Details Name: Nicholas Caldwell MRN: [id number] DOB: 07-31-1946 Today's Date: 11/26/2020    History of Present Illness Nicholas Caldwell is a 75 y.o. male with known history of chronic diastolic CHF last EF measured was 50 to 55% and November 2021 with history of chronic kidney disease stage IV with nonobstructive CAD, A. fib, sleep apnea, COPD diabetes mellitus presents to the ER with worsening shortness of breath and lower extremity edema.   Clinical Impression   Pt presents with decline in function and safety with ADLs and ADL mobility with impaired strength, balance and endurance. Pt lives at home with his wife and required assist with LB ADLS at baseline and uses RW and electric scooter for mobility. Pt would benefit from acute OT services to address impairments to maximize level of function and safety    Follow Up Recommendations  SNF (max HH therapies if still refusing SNF)    Equipment Recommendations  None recommended by OT    Recommendations for Other Services       Precautions / Restrictions Precautions Precautions: Fall Restrictions Weight Bearing Restrictions: No      Mobility Bed Mobility Overal bed mobility: Needs Assistance       Supine to sit: Min assist Sit to supine: Mod assist   General bed mobility comments: min A to elevate trunk and mod A with LEs back onto bed    Transfers Overall transfer level: Needs assistance Equipment used: Rolling walker (2 wheeled) Transfers: Sit to/from Stand Sit to Stand: Mod assist         General transfer comment: assist to power up to RW form EOB    Balance Overall balance assessment: Needs assistance Sitting-balance support: No upper extremity supported;Feet supported Sitting balance-Leahy Scale: Fair     Standing balance support: No upper extremity supported;Bilateral upper extremity supported Standing balance-Leahy Scale: Poor                             ADL  either performed or assessed with clinical judgement   ADL Overall ADL's : Needs assistance/impaired Eating/Feeding: Independent;Set up;Sitting   Grooming: Wash/dry hands;Wash/dry face;Min guard;Sitting   Upper Body Bathing: Min guard;Sitting   Lower Body Bathing: Maximal assistance   Upper Body Dressing : Min guard;Sitting   Lower Body Dressing: Maximal assistance     Toilet Transfer Details (indicate cue type and reason): mod A sit - stand form EOB, SPT towards  recliner, but refused to sit in recliner Toileting- Clothing Manipulation and Hygiene: Maximal assistance;Sit to/from stand       Functional mobility during ADLs: Moderate assistance;Rolling walker       Vision Patient Visual Report: No change from baseline       Perception     Praxis      Pertinent Vitals/Pain Pain Assessment: Faces Pain Score: 4  Pain Location: back and hip pain Pain Descriptors / Indicators: Aching Pain Intervention(s): Monitored during session;Repositioned     Hand Dominance Right   Extremity/Trunk Assessment Upper Extremity Assessment Upper Extremity Assessment: Generalized weakness   Lower Extremity Assessment Lower Extremity Assessment: Defer to PT evaluation       Communication Communication Communication: No difficulties   Cognition Arousal/Alertness: Awake/alert Behavior During Therapy: WFL for tasks assessed/performed Overall Cognitive Status: History of cognitive impairments - at baseline  General Comments       Exercises     Shoulder Instructions      Home Living Family/patient expects to be discharged to:: Private residence Living Arrangements: Spouse/significant other Available Help at Discharge: Family;Available 24 hours/day Type of Home: House Home Access: Ramped entrance   Entrance Stairs-Rails: None Home Layout: One level     Bathroom Shower/Tub: Occupational psychologist:  Standard Bathroom Accessibility: Yes   Home Equipment: Environmental consultant - 2 wheels;Wheelchair - manual;Bedside commode;Grab bars - tub/shower;Shower seat;Electric scooter;Walker - 4 wheels   Additional Comments: On 4 L O2 at home.      Prior Functioning/Environment Level of Independence: Needs assistance  Gait / Transfers Assistance Needed: Pt reports he has been staying in the bed and gets up with wifes assist.  He said he can get into the chair/wheelchair - with help of wife.  He feels like he has been getting stronger with HH ADL's / Homemaking Assistance Needed: Spouse assists with LB ADLs            OT Problem List: Decreased strength;Impaired balance (sitting and/or standing);Decreased activity tolerance;Pain      OT Treatment/Interventions: Self-care/ADL training;Patient/family education;Therapeutic exercise;Therapeutic activities;Balance training    OT Goals(Current goals can be found in the care plan section) Acute Rehab OT Goals Patient Stated Goal: To go home when I feel better OT Goal Formulation: With patient Time For Goal Achievement: 12/10/20 Potential to Achieve Goals: Good ADL Goals Pt Will Perform Grooming: with supervision;with set-up;sitting;with caregiver independent in assisting Pt Will Perform Upper Body Bathing: with supervision;with set-up;sitting;with caregiver independent in assisting Pt Will Perform Lower Body Bathing: with mod assist;sitting/lateral leans;sit to/from stand;with caregiver independent in assisting Pt Will Perform Upper Body Dressing: with supervision;with set-up;sitting;with caregiver independent in assisting Pt Will Transfer to Toilet: with min assist;ambulating;bedside commode Pt Will Perform Toileting - Clothing Manipulation and hygiene: with mod assist;sit to/from stand;with caregiver independent in assisting  OT Frequency: Min 2X/week   Barriers to D/C:            Co-evaluation              AM-PAC OT "6 Clicks" Daily Activity      Outcome Measure Help from another person eating meals?: None Help from another person taking care of personal grooming?: A Little Help from another person toileting, which includes using toliet, bedpan, or urinal?: Total Help from another person bathing (including washing, rinsing, drying)?: A Lot Help from another person to put on and taking off regular upper body clothing?: A Little Help from another person to put on and taking off regular lower body clothing?: Total 6 Click Score: 14   End of Session Equipment Utilized During Treatment: Gait belt;Rolling walker  Activity Tolerance: Patient limited by fatigue Patient left: in bed;with call bell/phone within reach;with bed alarm set;with family/visitor present  OT Visit Diagnosis: Unsteadiness on feet (R26.81);Other abnormalities of gait and mobility (R26.89);Muscle weakness (generalized) (M62.81);Pain Pain - part of body: Hip (back, hips)                Time: 6720-9470 OT Time Calculation (min): 25 min Charges:  OT General Charges $OT Visit: 1 Visit OT Evaluation $OT Eval Low Complexity: 1 Low OT Treatments $Self Care/Home Management : 8-22 mins    Britt Bottom 11/26/2020, 3:30 PM

## 2020-11-26 NOTE — Plan of Care (Signed)

## 2020-11-27 ENCOUNTER — Encounter (HOSPITAL_COMMUNITY): Payer: No Typology Code available for payment source

## 2020-11-27 LAB — GLUCOSE, CAPILLARY
Glucose-Capillary: 121 mg/dL — ABNORMAL HIGH (ref 70–99)
Glucose-Capillary: 125 mg/dL — ABNORMAL HIGH (ref 70–99)
Glucose-Capillary: 119 mg/dL — ABNORMAL HIGH (ref 70–99)

## 2020-11-27 MED ORDER — UMECLIDINIUM BROMIDE 62.5 MCG/INH IN AEPB: 1.0000 | INHALATION_SPRAY | Freq: Every day | RESPIRATORY_TRACT | 0 refills | Status: DC

## 2020-11-27 MED ORDER — GLUCERNA SHAKE PO LIQD: 237.0000 mL | Freq: Three times a day (TID) | ORAL | 0 refills | Status: AC

## 2020-11-27 MED ORDER — DOXYCYCLINE HYCLATE 100 MG PO TABS: 100.0000 mg | ORAL_TABLET | Freq: Two times a day (BID) | ORAL | 0 refills | Status: DC

## 2020-11-27 NOTE — Plan of Care (Signed)
Problem: Education: Goal: Knowledge of General Education information will improve Description: Including pain rating scale, medication(s)/side effects and non-pharmacologic comfort measures 11/27/2020 1101 by Camillia Herter, RN Outcome: Adequate for Discharge 11/27/2020 0728 by Camillia Herter, RN Outcome: Progressing   Problem: Health Behavior/Discharge Planning: Goal: Ability to manage health-related needs will improve 11/27/2020 1101 by Camillia Herter, RN Outcome: Adequate for Discharge 11/27/2020 0728 by Camillia Herter, RN Outcome: Progressing   Problem: Clinical Measurements: Goal: Ability to maintain clinical measurements within normal limits will improve 11/27/2020 1101 by Camillia Herter, RN Outcome: Adequate for Discharge 11/27/2020 0728 by Camillia Herter, RN Outcome: Progressing Goal: Will remain free from infection 11/27/2020 1101 by Camillia Herter, RN Outcome: Adequate for Discharge 11/27/2020 0728 by Camillia Herter, RN Outcome: Progressing Goal: Diagnostic test results will improve 11/27/2020 1101 by Camillia Herter, RN Outcome: Adequate for Discharge 11/27/2020 0728 by Camillia Herter, RN Outcome: Progressing Goal: Respiratory complications will improve 11/27/2020 1101 by Camillia Herter, RN Outcome: Adequate for Discharge 11/27/2020 0728 by Camillia Herter, RN Outcome: Progressing Goal: Cardiovascular complication will be avoided 11/27/2020 1101 by Camillia Herter, RN Outcome: Adequate for Discharge 11/27/2020 0728 by Camillia Herter, RN Outcome: Progressing   Problem: Activity: Goal: Risk for activity intolerance will decrease 11/27/2020 1101 by Camillia Herter, RN Outcome: Adequate for Discharge 11/27/2020 0728 by Camillia Herter, RN Outcome: Progressing   Problem: Nutrition: Goal: Adequate nutrition will be maintained 11/27/2020 1101 by Camillia Herter, RN Outcome: Adequate for Discharge 11/27/2020 0728 by Camillia Herter, RN Outcome: Progressing   Problem:  Coping: Goal: Level of anxiety will decrease 11/27/2020 1101 by Camillia Herter, RN Outcome: Adequate for Discharge 11/27/2020 0728 by Camillia Herter, RN Outcome: Progressing   Problem: Elimination: Goal: Will not experience complications related to bowel motility 11/27/2020 1101 by Camillia Herter, RN Outcome: Adequate for Discharge 11/27/2020 0728 by Camillia Herter, RN Outcome: Progressing Goal: Will not experience complications related to urinary retention 11/27/2020 1101 by Camillia Herter, RN Outcome: Adequate for Discharge 11/27/2020 0728 by Camillia Herter, RN Outcome: Progressing   Problem: Pain Managment: Goal: General experience of comfort will improve 11/27/2020 1101 by Camillia Herter, RN Outcome: Adequate for Discharge 11/27/2020 0728 by Camillia Herter, RN Outcome: Progressing   Problem: Safety: Goal: Ability to remain free from injury will improve 11/27/2020 1101 by Camillia Herter, RN Outcome: Adequate for Discharge 11/27/2020 0728 by Camillia Herter, RN Outcome: Progressing   Problem: Skin Integrity: Goal: Risk for impaired skin integrity will decrease 11/27/2020 1101 by Camillia Herter, RN Outcome: Adequate for Discharge 11/27/2020 0728 by Camillia Herter, RN Outcome: Progressing   Problem: Education: Goal: Ability to demonstrate management of disease process will improve 11/27/2020 1101 by Camillia Herter, RN Outcome: Adequate for Discharge 11/27/2020 0728 by Camillia Herter, RN Outcome: Progressing Goal: Ability to verbalize understanding of medication therapies will improve 11/27/2020 1101 by Camillia Herter, RN Outcome: Adequate for Discharge 11/27/2020 0728 by Camillia Herter, RN Outcome: Progressing Goal: Individualized Educational Video(s) 11/27/2020 1101 by Camillia Herter, RN Outcome: Adequate for Discharge 11/27/2020 0728 by Camillia Herter, RN Outcome: Progressing   Problem: Activity: Goal: Capacity to carry out activities will improve 11/27/2020 1101 by Camillia Herter, RN Outcome: Adequate for Discharge 11/27/2020 8676 by Camillia Herter, RN Outcome: Progressing   Problem: Cardiac: Goal: Ability to achieve and maintain adequate cardiopulmonary perfusion  will improve 11/27/2020 1101 by Camillia Herter, RN Outcome: Adequate for Discharge 11/27/2020 [phone] by Camillia Herter, RN Outcome: Progressing

## 2020-11-27 NOTE — TOC Transition Note (Addendum)
Transition of Care (TOC) - CM/SW Discharge Note Marvetta Gibbons RN, BSN Transitions of Care Unit 4E- RN Case Manager See Treatment Team for direct phone # Cross Coverage for [place]   Patient Details  Name: Nicholas Caldwell MRN: [id number] Date of Birth: Oct 17, 1946  Transition of Care [facility] CM/SW Contact:  Dawayne Patricia, RN Phone Number: 11/27/2020, 2:17 PM   Clinical Narrative:    Pt stable for transition home today, orders have been placed for resumption of [place] services- RN/PT/OT/aide- have notified Kenzie with [facility] of discharge for resumption of [place] needs.  Pt will need assistance with transportation home- will use [facility]- stretcher assisted transport.  Paperwork has been placed on shadow chart- [facility] # provided to bedside RN to [phone]  Waiver form provided for RN to have patient to sign- this has been signed and faxed to [employer].     Final next level of care: [place] Barriers to Discharge: Barriers Resolved   Patient Goals and CMS Choice Patient states their goals for this hospitalization and ongoing recovery are:: to go home with [facility] services CMS Medicare.gov Compare Post Acute Care list provided to:: Patient Choice offered to / list presented to : Patient  Discharge Placement               Home with [facility]        Discharge Plan and Services In-house Referral: NA Discharge Planning Services: CM Consult Post Acute Care Choice: Home Health,Resumption of Svcs/PTA Provider          DME Arranged: N/A DME Agency: NA       HH Arranged: RN,PT,OT,Nurse's Aide [place] Agency: [place] ([facility]) Date HH Agency Contacted: 11/27/20 Time [place]: [place] Representative spoke with at [place]: [place] ([place]) Interventions     Readmission Risk Interventions Readmission Risk Prevention Plan 11/27/2020 11/23/2020 08/15/2019  Transportation Screening - Complete Complete   PCP or Specialist Appt within 3-5 Days Complete - -  HRI or Home Care Consult Complete - -  Social Work Consult for [place] Planning/Counseling Complete - -  Palliative Care Screening Not Applicable - -  Medication Review Press photographer) Complete Complete Not Complete  Med Review Comments - - requested discharge medication reconciliation by attending  PCP or Specialist appointment within 3-5 days of discharge - - Not Complete  PCP/Specialist Appt Not Complete comments - - [facility] informed of admit and will contact pt directly regarding follow up appt  [place] or [place] - Complete Complete  SW Recovery Care/Counseling Consult - Complete -  [place] - Not Applicable -  Some recent data might be hidden

## 2020-11-27 NOTE — Discharge Summary (Signed)
Physician Discharge Summary  Nicholas Caldwell HWE:[id number] DOB: 04/30/1946 DOA: 11/21/2020  PCP: [name], MD  Admit date: 11/21/2020 Discharge date: 11/27/2020  Recommendations for Outpatient Follow-up:  1. Discharge to home with home health RN/PT/OT/HomeHealthAide 2. Follow up with PCP in 7-10 days. Have chemistry drawn at that visit and reported to PCP.   Follow-up Information    [facility] Follow up.   Specialty: Home Health Services Why: HHRN/PT/PT/aide services to resume             Discharge Diagnoses: Principal diagnosis is #1 1. Acute on chronic diastolic CHF. 2. Acute on chronic hypoxic respiratory failure. 3. Paroxysmal atrial fibrillation 4. Nonobstructive cAD 5. DMII with renal manifestations 6. OSA on CPAP 7. COPD 8. History of sacral decubitus ulcer - resolved 9. History of chronic pain.  10. CKD IV. 11. Pulmonary hypertension  Discharge Condition: Fair   Disposition: Home  Diet recommendation: Heart healthy with modified carbohydrates.  Filed Weights   11/26/20 0039 11/26/20 0423 11/27/20 0358  Weight: 107.5 kg 107.5 kg 105.2 kg    History of present illness: Nicholas Caldwell is a 75 y.o. male with known history of chronic diastolic CHF last EF measured was 50 to 55% and November 2021 with history of chronic kidney disease stage IV with nonobstructive CAD, A. fib, sleep apnea, COPD diabetes mellitus presents to the ER with worsening shortness of breath and lower extremity edema.  Patient states over the last 2 to 3 days patient became more short of breath and has been noticing increasing lower extremity edema.  Patient states he is usually bedbound.  Denies chest pain productive cough or fever chills.  ED Course: In the ER patient was noted to be hypoxic requiring 10 L nonrebreather on the way.  Chest x-ray shows features concerning for congestion versus pneumonia.  EKG shows normal sinus rhythm with RBBB.  Labs are significant for  BNP of 2400 high sensitive troponin of 44 hemoglobin 9.2 and potassium of 2.9.  Patient was given potassium replacement and Lasix 40 mg IV admitted for acute on chronic CHF.  COVID test was negative.  Hospital Course: [facility] were consulted to admit the patient for further evaluation and treatment.  The patient has been admitted to a telemetry bed. He is receiving IV lasix and IV doxycycline. His respiratory status is trending towards baseline. Creatinine is also trending down. The plan is for the patient to discharge to home with home health as is his preference, although the patient is max assist for OOB. PT/OT have recommended SNF.  The patient is currently saturating in the low 90's on 5L. The patient was weaned as possible down to his baseline O2 requirements at 4L.   He will be discharged to home in fair condition.  Today's assessment: S: The patient is resting comfortably. No new complaints. O: Vitals:  Vitals:   11/27/20 1211 11/27/20 1507  BP: (!) 116/57   Pulse: 86 78  Resp: 18 18  Temp: 99.2 F (37.3 C) 98.7 F (37.1 C)  SpO2: 96% 100%   Exam:  Constitutional:  . The patient is awake, alert, and oriented x 3. No acute distress. Respiratory:  . No increased work of breathing. . No wheezes, rales, or rhonchi . No tactile fremitus Cardiovascular:  . Regular rate and rhythm . No murmurs, ectopy, or gallups. . No lateral PMI. No thrills. Abdomen:  . Abdomen is soft, non-tender, non-distended . No hernias, masses, or organomegaly . Normoactive bowel sounds.  Musculoskeletal:  . No cyanosis, clubbing, or edema Skin:  . No rashes, lesions, ulcers . palpation of skin: no induration or nodules Neurologic:  . CN 2-12 intact . Sensation all 4 extremities intact Psychiatric:  . Mental status o Mood, affect appropriate o Orientation to person, place, time  . judgment and insight appear intact  Discharge Instructions  Discharge Instructions     Activity as tolerated - No restrictions   Complete by: As directed    Call MD for:  difficulty breathing, headache or visual disturbances   Complete by: As directed    Call MD for:  temperature >100.4   Complete by: As directed    Diet - low sodium heart healthy   Complete by: As directed    Discharge instructions   Complete by: As directed    Discharge to home with home health RN/PT/OT/HomeHealthAide Follow up with PCP in 7-10 days. Have chemistry drawn at that visit and reported to PCP.   Discharge wound care:   Complete by: As directed    Place tegaderm daily and offload sacrum.   Increase activity slowly   Complete by: As directed      Allergies as of 11/27/2020      Reactions   Bidil [isosorb Dinitrate-hydralazine] Shortness Of Breath   Benazepril Nausea And Vomiting   Brimonidine    Other reaction(s): Other (See Comments)   Metformin    Other reaction(s): Cramps (ALLERGY/intolerance), Other (See Comments)   Morphine Rash      Medication List    TAKE these medications   acetaminophen 325 MG tablet Commonly known as: TYLENOL Take 2 tablets (650 mg total) by mouth every 4 (four) hours as needed for headache or mild pain.   albuterol 108 (90 Base) MCG/ACT inhaler Commonly known as: VENTOLIN HFA Inhale 2 puffs into the lungs every 6 (six) hours as needed for wheezing or shortness of breath.   albuterol (2.5 MG/3ML) 0.083% nebulizer solution Commonly known as: PROVENTIL Take 3 mLs (2.5 mg total) by nebulization every 4 (four) hours as needed for wheezing or shortness of breath.   amiodarone 200 MG tablet Commonly known as: PACERONE Take 0.5 tablets (100 mg total) by mouth daily.   apixaban 2.5 MG Tabs tablet Commonly known as: ELIQUIS Take 1 tablet (2.5 mg total) by mouth 2 (two) times daily.   ascorbic acid 500 MG tablet Commonly known as: VITAMIN C Take 500 mg by mouth 3 (three) times a week. Monday Wednesday Friday   atorvastatin 40 MG tablet Commonly known  as: LIPITOR Take 1 tablet (40 mg total) by mouth daily. What changed: when to take this   Breztri Aerosphere 160-9-4.8 MCG/ACT Aero Generic drug: Budeson-Glycopyrrol-Formoterol Inhale 2 puffs into the lungs 2 (two) times daily.   carbamide peroxide 6.5 % OTIC solution Commonly known as: DEBROX Place 5-10 drops into both ears daily as needed (ear wax).   Carboxymethylcellulose Sod PF 0.25 % Soln Place 1 drop into both eyes 4 (four) times daily.   cetirizine 10 MG tablet Commonly known as: ZYRTEC Take 10 mg by mouth daily as needed for allergies.   doxycycline 100 MG tablet Commonly known as: VIBRA-TABS Take 1 tablet (100 mg total) by mouth every 12 (twelve) hours.   famotidine 20 MG tablet Commonly known as: PEPCID TAKE 1 TABLET(20 MG) BY MOUTH TWICE DAILY What changed: See the new instructions.   feeding supplement (GLUCERNA SHAKE) Liqd Take 237 mLs by mouth 3 (three) times daily between meals.   fluticasone  50 MCG/ACT nasal spray Commonly known as: FLONASE Place 2 sprays into both nostrils daily as needed for allergies.   hydrocortisone 1 % lotion Apply 1 application topically See admin instructions. Apply small amount to back twice daily for itchy rash as needed   hydrocortisone 2.5 % rectal cream Commonly known as: ANUSOL-HC Place 1 application rectally 2 (two) times daily as needed for hemorrhoids or itching.   insulin aspart 100 UNIT/ML injection Commonly known as: novoLOG Inject 0-15 Units into the skin 3 (three) times daily with meals.   Insulin Syringe 27G X 1/2" 0.5 ML Misc 100 Syringes by Does not apply route 3 (three) times daily.   latanoprost 0.005 % ophthalmic solution Commonly known as: XALATAN Place 1 drop into both eyes at bedtime.   lidocaine 5 % Commonly known as: LIDODERM Place 1 patch onto the skin daily. Remove & Discard patch within 12 hours or as directed by MD   magnesium oxide 400 (241.3 Mg) MG tablet Commonly known as: MAG-OX Take  0.5 tablets (200 mg total) by mouth daily.   metolazone 2.5 MG tablet Commonly known as: ZAROXOLYN Take 2.5 mg by mouth See admin instructions. 1 tablet on Monday and Wednesday as needed  for weight gain of 3 pounds in 24 hours   oxyCODONE-acetaminophen 10-325 MG tablet Commonly known as: PERCOCET Take 1 tablet by mouth every 6 (six) hours as needed for pain.   OXYGEN Inhale 3 L into the lungs continuous.   potassium chloride SA 20 MEQ tablet Commonly known as: KLOR-CON Take 1 tablet (20 mEq total) by mouth 2 (two) times daily.   PRESCRIPTION MEDICATION Cpap   silver sulfADIAZINE 1 % cream Commonly known as: SILVADENE Apply topically daily.   simethicone 80 MG chewable tablet Commonly known as: MYLICON Chew 1 tablet (80 mg total) by mouth every 6 (six) hours as needed for flatulence.   sodium chloride 0.65 % Soln nasal spray Commonly known as: OCEAN Place 2 sprays into both nostrils as needed for congestion.   tamsulosin 0.4 MG Caps capsule Commonly known as: FLOMAX Take 1 capsule (0.4 mg total) by mouth daily. What changed: when to take this   torsemide 20 MG tablet Commonly known as: DEMADEX Take 4 tablets (80 mg total) by mouth 2 (two) times daily.   umeclidinium bromide 62.5 MCG/INH Aepb Commonly known as: INCRUSE ELLIPTA Inhale 1 puff into the lungs daily. Start taking on: November 28, 2020   zinc oxide 20 % ointment Apply 1 application topically 2 (two) times daily as needed for irritation (rash).            Discharge Care Instructions  (From admission, onward)         Start     Ordered   11/27/20 0000  Discharge wound care:       Comments: Place tegaderm daily and offload sacrum.   11/27/20 1054         Allergies  Allergen Reactions  . Bidil [Isosorb Dinitrate-Hydralazine] Shortness Of Breath  . Benazepril Nausea And Vomiting  . Brimonidine     Other reaction(s): Other (See Comments)  . Metformin     Other reaction(s): Cramps  (ALLERGY/intolerance), Other (See Comments)  . Morphine Rash    The results of significant diagnostics from this hospitalization (including imaging, microbiology, ancillary and laboratory) are listed below for reference.    Significant Diagnostic Studies: DG Chest Port 1 View  Result Date: 11/21/2020 CLINICAL DATA:  Shortness of breath EXAM: PORTABLE CHEST 1 VIEW  COMPARISON:  10/29/2020, 08/08/2020, 08/14/2019 FINDINGS: Cardiomegaly with vascular congestion. Increasing bilateral effusions and lower lung airspace disease. No pneumothorax IMPRESSION: Cardiomegaly with vascular congestion. Worsening bilateral effusions and left greater than right basilar airspace disease which may reflect pneumonia. Electronically Signed   By: Donavan Foil M.D.   On: 11/21/2020 20:17   DG Chest Portable 1 View  Result Date: 10/29/2020 CLINICAL DATA:  Shortness of breath EXAM: PORTABLE CHEST 1 VIEW COMPARISON:  10/05/2020 CT, chest x-ray 09/25/2020, 06/21/2020, 07/07/2019 FINDINGS: Cardiomegaly with vascular congestion and mild edema. Small pleural effusions. Persistent airspace disease at the left lung base. Aortic atherosclerosis. IMPRESSION: Cardiomegaly with vascular congestion, mild edema and small pleural effusions. Persistent airspace disease at the left base, some of which appears chronic. Electronically Signed   By: Donavan Foil M.D.   On: 10/29/2020 23:01    Microbiology: Recent Results (from the past 240 hour(s))  Resp Panel by RT-PCR (Flu A&B, Covid) Nasopharyngeal Swab     Status: None   Collection Time: 11/21/20  7:45 PM   Specimen: Nasopharyngeal Swab; Nasopharyngeal(NP) swabs in vial transport medium  Result Value Ref Range Status   SARS Coronavirus 2 by RT PCR NEGATIVE NEGATIVE Final    Comment: (NOTE) SARS-CoV-2 target nucleic acids are NOT DETECTED.  The SARS-CoV-2 RNA is generally detectable in upper respiratory specimens during the acute phase of infection. The lowest concentration of  SARS-CoV-2 viral copies this assay can detect is 138 copies/mL. A negative result does not preclude SARS-Cov-2 infection and should not be used as the sole basis for treatment or other patient management decisions. A negative result may occur with  improper specimen collection/handling, submission of specimen other than nasopharyngeal swab, presence of viral mutation(s) within the areas targeted by this assay, and inadequate number of viral copies(<138 copies/mL). A negative result must be combined with clinical observations, patient history, and epidemiological information. The expected result is Negative.  Fact Sheet for Patients:  EntrepreneurPulse.com.au  Fact Sheet for Healthcare Providers:  IncredibleEmployment.be  This test is no t yet approved or cleared by the [place] FDA and  has been authorized for detection and/or diagnosis of SARS-CoV-2 by FDA under an Emergency Use Authorization (EUA). This EUA will remain  in effect (meaning this test can be used) for the duration of the COVID-19 declaration under Section 564(b)(1) of the Act, 21 U.S.C.section 360bbb-3(b)(1), unless the authorization is terminated  or revoked sooner.       Influenza A by PCR NEGATIVE NEGATIVE Final   Influenza B by PCR NEGATIVE NEGATIVE Final    Comment: (NOTE) The Xpert Xpress SARS-CoV-2/FLU/RSV plus assay is intended as an aid in the diagnosis of influenza from Nasopharyngeal swab specimens and should not be used as a sole basis for treatment. Nasal washings and aspirates are unacceptable for Xpert Xpress SARS-CoV-2/FLU/RSV testing.  Fact Sheet for Patients: EntrepreneurPulse.com.au  Fact Sheet for Healthcare Providers: IncredibleEmployment.be  This test is not yet approved or cleared by the [place] FDA and has been authorized for detection and/or diagnosis of SARS-CoV-2 by FDA under an Emergency Use  Authorization (EUA). This EUA will remain in effect (meaning this test can be used) for the duration of the COVID-19 declaration under Section 564(b)(1) of the Act, 21 U.S.C. section 360bbb-3(b)(1), unless the authorization is terminated or revoked.  Performed at [facility], [place] [address], [place], [place] [address]      Labs: Basic Metabolic Panel: Recent Labs  Lab 11/21/20 1944 11/21/20 2025 11/21/20 2026 11/22/20  5176 11/22/20 0359 11/23/20 0207 11/24/20 0226 11/25/20 0232  NA 140   < > 139  --  141 141 145 143  K 2.9*   < > 2.8*  --  3.1* 3.5 3.6 3.7  CL 97*  --  95*  --  97* 98 97* 94*  CO2 29  --   --   --  31 32 35* 37*  GLUCOSE 133*  --  129*  --  155* 158* 121* 116*  BUN 53*  --  56*  --  49* 41* 33* 29*  CREATININE 2.70*  --  2.60*  --  2.51* 2.11* 1.77* 1.84*  CALCIUM 8.5*  --   --   --  8.4* 8.3* 8.4* 8.9  MG  --   --   --  2.3  --   --   --   --    < > = values in this interval not displayed.   Liver Function Tests: Recent Labs  Lab 11/21/20 1944 11/23/20 0207 11/24/20 0226  AST _0 ALT _1 ALKPHOS 109 90 85  BILITOT 0.6 0.6 0.8  PROT 6.9 6.2* 6.5  ALBUMIN 2.8* 2.5* 2.6*   No results for input(s): LIPASE, AMYLASE in the last 168 hours. No results for input(s): AMMONIA in the last 168 hours. CBC: Recent Labs  Lab 11/21/20 1944 11/21/20 2025 11/21/20 2026 11/22/20 0359 11/23/20 0207 11/25/20 0232  WBC 5.6  --   --  5.5 5.4 5.2  NEUTROABS 4.3  --   --   --  3.5 3.3  HGB 9.2* 10.9* 10.5* 8.8* 8.4* 8.9*  HCT 32.0* 32.0* 31.0* 29.8* 28.3* 30.2*  MCV 90.1  --   --  87.9 87.6 87.3  PLT 393  --   --  358 350 357   Cardiac Enzymes: No results for input(s): CKTOTAL, CKMB, CKMBINDEX, TROPONINI in the last 168 hours. BNP: BNP (last 3 results) Recent Labs    10/05/20 0105 10/29/20 2252 11/21/20 1944  BNP 1,180.7* 2,913.5* 2,475.4*    ProBNP (last 3 results) No results for input(s): PROBNP in the last 8760  hours.  CBG: Recent Labs  Lab 11/26/20 1135 11/26/20 1624 11/26/20 2116 11/27/20 0757 11/27/20 1208  GLUCAP 119* 117* 114* 121* 125*    Principal Problem:   Acute on chronic combined systolic and diastolic CHF (congestive heart failure) (HCC) Active Problems:   PAF (paroxysmal atrial fibrillation) (HCC)   Nonischemic cardiomyopathy (HCC)   CAD - Non-obstructive by LHC1/16   Uncontrolled type 2 diabetes mellitus with complication (HCC)   Pulmonary hypertension (HCC)   COPD GOLD 0   Chronic respiratory failure with hypoxia (HCC)   Acute CHF (congestive heart failure) ([place])   Stage III pressure ulcer of sacral region [facility])   Time coordinating discharge: 38 minutes.  Signed:        Larry Knipp, DO [facility]  11/27/2020, 4:02 PM

## 2020-11-27 NOTE — Progress Notes (Signed)
PT Cancellation Note  Patient Details Name: Nicholas Caldwell MRN: [id number] DOB: [birth year]   Cancelled Treatment:    Reason Eval/Treat Not Completed: Patient declined, no reason specified. Pt states he is awaiting ride home and does not want to work with therapy.   Leighton Roach, [place]  Pager [phone] Office [place] 11/27/2020, 2:15 PM

## 2020-11-27 NOTE — Clinical Note (Incomplete)
ED CM s

## 2020-11-27 NOTE — Plan of Care (Signed)

## 2020-11-28 ENCOUNTER — Other Ambulatory Visit (HOSPITAL_COMMUNITY): Payer: Self-pay

## 2020-11-28 NOTE — Progress Notes (Signed)
Patient is now discharged from [facility].  Patient has/has not met the following goals:  Yes :Patient expresses basic understanding of medications and what they are for Yes :Patient able to verbalize heart failure specific dietary/fluid restrictions Yes :Patient is aware of who to call if they have medical concerns or if they need to schedule or change appts No :Patient has a scale for daily weights and weighs regularly No :Patient able to verbalize concerning symptoms when they should call the HF clinic (weight gain ranges, etc) Yes :Patient has a PCP and has seen within the past year or has upcoming appt Yes :Patient has reliable access to getting their medications Yes :Patient has shown they are able to reorder medications reliably Yes :Patient has had admission in past 30 days- if yes how many? 2 Yes :Patient has had admission in past 90 days- if yes how many? 3  Discharge Comments: Pt is being d/c due to multiple no show/cancel upon arrival appointments without any notice.   I called and spoke to wife this morning, 10am was too early for him and we agreed for 4pm. I also called at 2pm to see if I could come earlier as I was running early but he said wife was not at home and he couldn't get to the door to let me in. Which was fine, I again asked if she would be back at 4pm and he said yes. When I arrived her car was gone, nobody answered the phone and nobody answered the door.  He was d/c to have [facility] continue and home aide.  pts wife has been doing his pill box.  They are able to call and cancel appointments but then doesn't call when he is feeling worse or having increased symptoms.  Has scales but isnt weighing regularly. Last visit he was unable to stand long enough to even weigh.    Marylouise Stacks, EMT-Paramedic  11/28/20

## 2020-12-09 DIAGNOSIS — J449 Chronic obstructive pulmonary disease, unspecified: Secondary | ICD-10-CM | POA: Diagnosis not present

## 2020-12-09 IMAGING — DX DG CHEST 1V PORT
1 series · 1 of 1 positions shown · non-contrast
Comparison: Chest radiograph 06/08/2019

CLINICAL DATA: Patient with shortness of breath.

EXAM:
PORTABLE CHEST 1 VIEW

[chest]
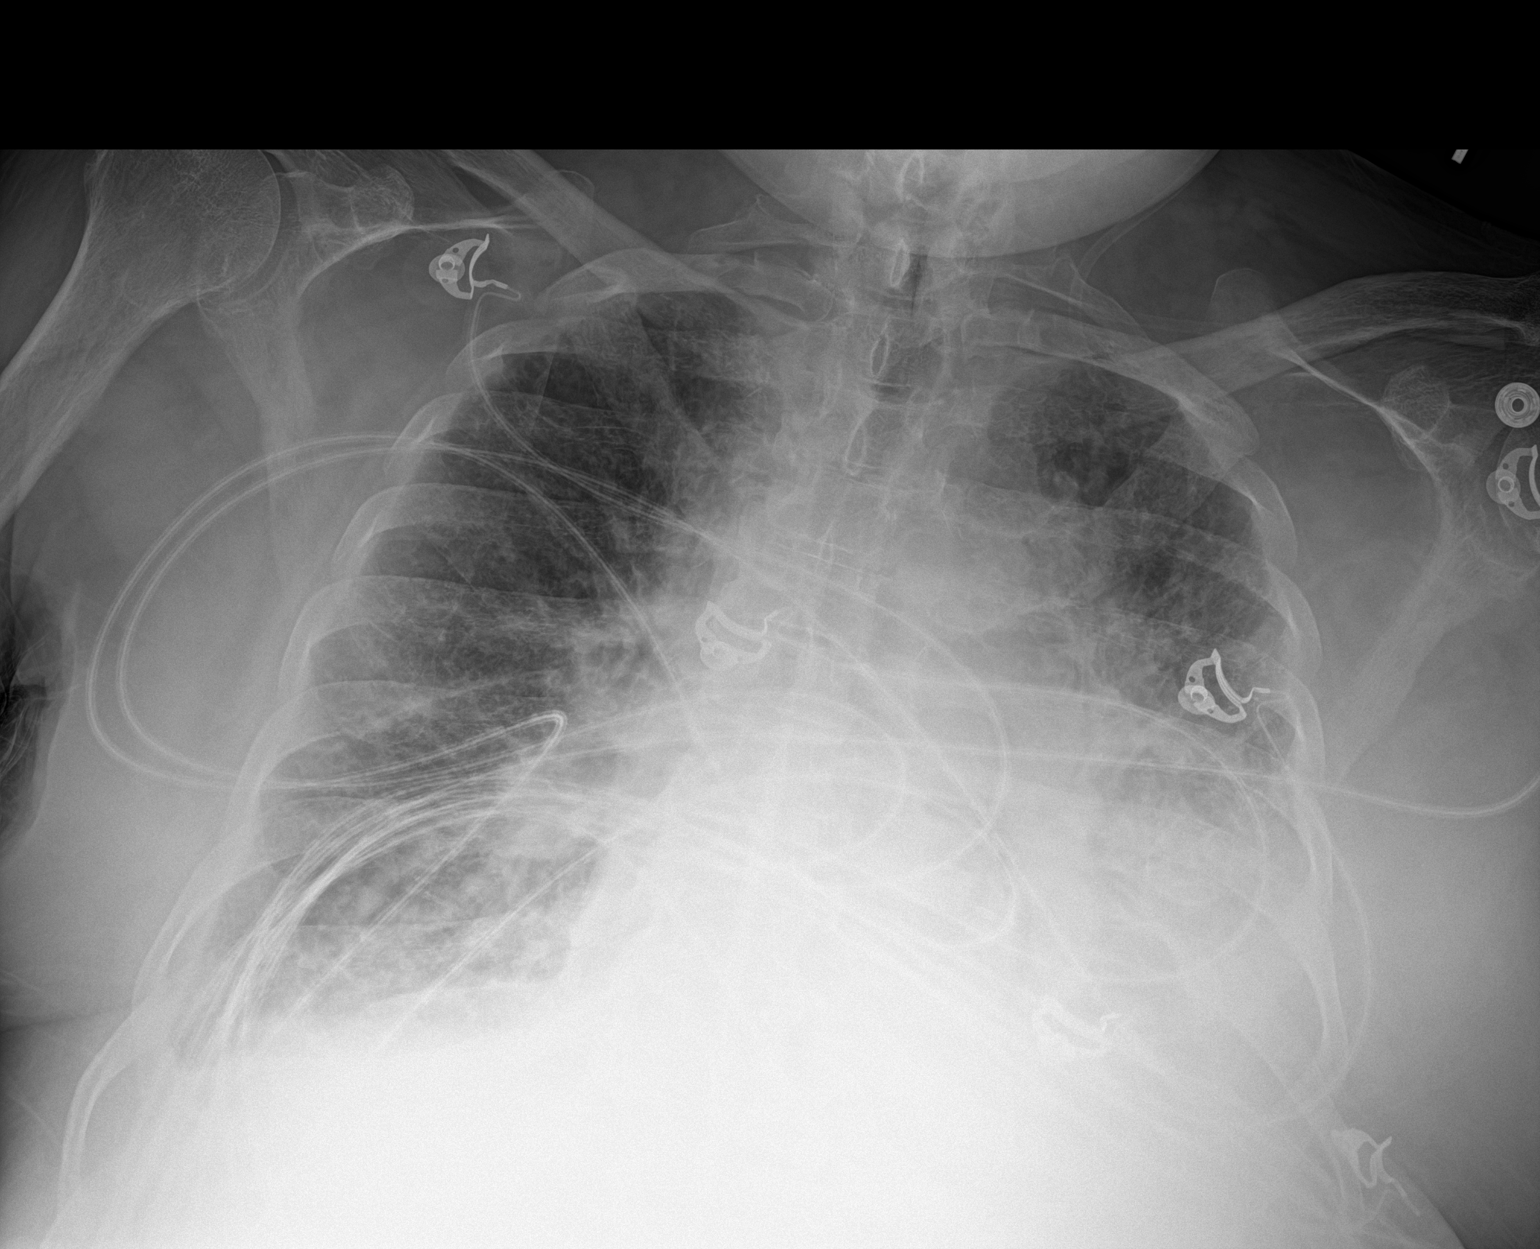

[1 of 1 positions shown; findings below may reference images not displayed]

FINDINGS: Monitoring leads overlie the patient. Stable cardiomegaly. Diffuse
bilateral interstitial pulmonary opacities. Small bilateral pleural
effusions, left greater than right. No pneumothorax.
IMPRESSION: Cardiomegaly with interstitial edema and layering effusions.

## 2020-12-11 ENCOUNTER — Emergency Department (HOSPITAL_COMMUNITY): Payer: No Typology Code available for payment source

## 2020-12-11 ENCOUNTER — Encounter (HOSPITAL_COMMUNITY): Payer: Self-pay | Admitting: Emergency Medicine

## 2020-12-11 ENCOUNTER — Other Ambulatory Visit: Payer: Self-pay

## 2020-12-11 ENCOUNTER — Inpatient Hospital Stay (HOSPITAL_COMMUNITY)
Admission: EM | Admit: 2020-12-11 | Discharge: 2020-12-19 | DRG: 871 | Disposition: A | Discharge status: Home / Self Care | Payer: No Typology Code available for payment source | Attending: Family Medicine | Admitting: Family Medicine

## 2020-12-11 ENCOUNTER — Inpatient Hospital Stay (HOSPITAL_COMMUNITY): Payer: No Typology Code available for payment source

## 2020-12-11 DIAGNOSIS — I5023 Acute on chronic systolic (congestive) heart failure: Secondary | ICD-10-CM

## 2020-12-11 DIAGNOSIS — Z8249 Family history of ischemic heart disease and other diseases of the circulatory system: Secondary | ICD-10-CM

## 2020-12-11 DIAGNOSIS — E1169 Type 2 diabetes mellitus with other specified complication: Secondary | ICD-10-CM | POA: Diagnosis not present

## 2020-12-11 DIAGNOSIS — E669 Obesity, unspecified: Secondary | ICD-10-CM | POA: Diagnosis present

## 2020-12-11 DIAGNOSIS — N179 Acute kidney failure, unspecified: Secondary | ICD-10-CM | POA: Diagnosis present

## 2020-12-11 DIAGNOSIS — Z794 Long term (current) use of insulin: Secondary | ICD-10-CM

## 2020-12-11 DIAGNOSIS — G4733 Obstructive sleep apnea (adult) (pediatric): Secondary | ICD-10-CM | POA: Diagnosis present

## 2020-12-11 DIAGNOSIS — E1142 Type 2 diabetes mellitus with diabetic polyneuropathy: Secondary | ICD-10-CM | POA: Diagnosis present

## 2020-12-11 DIAGNOSIS — R34 Anuria and oliguria: Secondary | ICD-10-CM | POA: Diagnosis present

## 2020-12-11 DIAGNOSIS — E1122 Type 2 diabetes mellitus with diabetic chronic kidney disease: Secondary | ICD-10-CM | POA: Diagnosis present

## 2020-12-11 DIAGNOSIS — Z20822 Contact with and (suspected) exposure to covid-19: Secondary | ICD-10-CM | POA: Diagnosis present

## 2020-12-11 DIAGNOSIS — I509 Heart failure, unspecified: Secondary | ICD-10-CM

## 2020-12-11 DIAGNOSIS — Z79899 Other long term (current) drug therapy: Secondary | ICD-10-CM

## 2020-12-11 DIAGNOSIS — Z87891 Personal history of nicotine dependence: Secondary | ICD-10-CM

## 2020-12-11 DIAGNOSIS — I272 Pulmonary hypertension, unspecified: Secondary | ICD-10-CM | POA: Diagnosis present

## 2020-12-11 DIAGNOSIS — E1165 Type 2 diabetes mellitus with hyperglycemia: Secondary | ICD-10-CM | POA: Diagnosis not present

## 2020-12-11 DIAGNOSIS — I5022 Chronic systolic (congestive) heart failure: Secondary | ICD-10-CM

## 2020-12-11 DIAGNOSIS — I480 Paroxysmal atrial fibrillation: Secondary | ICD-10-CM | POA: Diagnosis present

## 2020-12-11 DIAGNOSIS — I959 Hypotension, unspecified: Secondary | ICD-10-CM | POA: Diagnosis not present

## 2020-12-11 DIAGNOSIS — R911 Solitary pulmonary nodule: Secondary | ICD-10-CM | POA: Diagnosis present

## 2020-12-11 DIAGNOSIS — Z7901 Long term (current) use of anticoagulants: Secondary | ICD-10-CM

## 2020-12-11 DIAGNOSIS — E876 Hypokalemia: Secondary | ICD-10-CM | POA: Diagnosis present

## 2020-12-11 DIAGNOSIS — R1084 Generalized abdominal pain: Secondary | ICD-10-CM | POA: Diagnosis not present

## 2020-12-11 DIAGNOSIS — N184 Chronic kidney disease, stage 4 (severe): Secondary | ICD-10-CM | POA: Diagnosis present

## 2020-12-11 DIAGNOSIS — R6520 Severe sepsis without septic shock: Secondary | ICD-10-CM | POA: Diagnosis present

## 2020-12-11 DIAGNOSIS — I2510 Atherosclerotic heart disease of native coronary artery without angina pectoris: Secondary | ICD-10-CM | POA: Diagnosis present

## 2020-12-11 DIAGNOSIS — I10 Essential (primary) hypertension: Secondary | ICD-10-CM

## 2020-12-11 DIAGNOSIS — J9611 Chronic respiratory failure with hypoxia: Secondary | ICD-10-CM | POA: Diagnosis present

## 2020-12-11 DIAGNOSIS — J9612 Chronic respiratory failure with hypercapnia: Secondary | ICD-10-CM | POA: Diagnosis present

## 2020-12-11 DIAGNOSIS — A419 Sepsis, unspecified organism: Principal | ICD-10-CM | POA: Diagnosis present

## 2020-12-11 DIAGNOSIS — I313 Pericardial effusion (noninflammatory): Secondary | ICD-10-CM | POA: Diagnosis not present

## 2020-12-11 DIAGNOSIS — Z8674 Personal history of sudden cardiac arrest: Secondary | ICD-10-CM

## 2020-12-11 DIAGNOSIS — K5900 Constipation, unspecified: Secondary | ICD-10-CM | POA: Diagnosis present

## 2020-12-11 DIAGNOSIS — Z9981 Dependence on supplemental oxygen: Secondary | ICD-10-CM

## 2020-12-11 DIAGNOSIS — Z8673 Personal history of transient ischemic attack (TIA), and cerebral infarction without residual deficits: Secondary | ICD-10-CM

## 2020-12-11 DIAGNOSIS — Z9114 Patient's other noncompliance with medication regimen: Secondary | ICD-10-CM

## 2020-12-11 DIAGNOSIS — J449 Chronic obstructive pulmonary disease, unspecified: Secondary | ICD-10-CM | POA: Diagnosis present

## 2020-12-11 DIAGNOSIS — J189 Pneumonia, unspecified organism: Secondary | ICD-10-CM | POA: Diagnosis present

## 2020-12-11 DIAGNOSIS — R0902 Hypoxemia: Secondary | ICD-10-CM | POA: Diagnosis not present

## 2020-12-11 DIAGNOSIS — Z885 Allergy status to narcotic agent status: Secondary | ICD-10-CM

## 2020-12-11 DIAGNOSIS — Y95 Nosocomial condition: Secondary | ICD-10-CM | POA: Diagnosis present

## 2020-12-11 DIAGNOSIS — I5032 Chronic diastolic (congestive) heart failure: Secondary | ICD-10-CM | POA: Diagnosis present

## 2020-12-11 DIAGNOSIS — G9341 Metabolic encephalopathy: Secondary | ICD-10-CM | POA: Diagnosis present

## 2020-12-11 DIAGNOSIS — I4892 Unspecified atrial flutter: Secondary | ICD-10-CM | POA: Diagnosis present

## 2020-12-11 DIAGNOSIS — R140 Abdominal distension (gaseous): Secondary | ICD-10-CM | POA: Diagnosis not present

## 2020-12-11 DIAGNOSIS — G8929 Other chronic pain: Secondary | ICD-10-CM | POA: Diagnosis present

## 2020-12-11 DIAGNOSIS — I130 Hypertensive heart and chronic kidney disease with heart failure and stage 1 through stage 4 chronic kidney disease, or unspecified chronic kidney disease: Secondary | ICD-10-CM | POA: Diagnosis present

## 2020-12-11 DIAGNOSIS — I50810 Right heart failure, unspecified: Secondary | ICD-10-CM | POA: Diagnosis not present

## 2020-12-11 DIAGNOSIS — K828 Other specified diseases of gallbladder: Secondary | ICD-10-CM | POA: Diagnosis present

## 2020-12-11 DIAGNOSIS — R109 Unspecified abdominal pain: Secondary | ICD-10-CM

## 2020-12-11 DIAGNOSIS — N183 Chronic kidney disease, stage 3 unspecified: Secondary | ICD-10-CM | POA: Diagnosis not present

## 2020-12-11 DIAGNOSIS — N400 Enlarged prostate without lower urinary tract symptoms: Secondary | ICD-10-CM | POA: Diagnosis present

## 2020-12-11 DIAGNOSIS — Z888 Allergy status to other drugs, medicaments and biological substances status: Secondary | ICD-10-CM

## 2020-12-11 DIAGNOSIS — J440 Chronic obstructive pulmonary disease with acute lower respiratory infection: Secondary | ICD-10-CM | POA: Diagnosis present

## 2020-12-11 DIAGNOSIS — Z79891 Long term (current) use of opiate analgesic: Secondary | ICD-10-CM

## 2020-12-11 DIAGNOSIS — Z9081 Acquired absence of spleen: Secondary | ICD-10-CM

## 2020-12-11 DIAGNOSIS — I428 Other cardiomyopathies: Secondary | ICD-10-CM | POA: Diagnosis present

## 2020-12-11 DIAGNOSIS — E785 Hyperlipidemia, unspecified: Secondary | ICD-10-CM | POA: Diagnosis present

## 2020-12-11 LAB — URINALYSIS, ROUTINE W REFLEX MICROSCOPIC
Bilirubin Urine: NEGATIVE
Glucose, UA: NEGATIVE mg/dL
Hgb urine dipstick: NEGATIVE
Ketones, ur: NEGATIVE mg/dL
Leukocytes,Ua: NEGATIVE
Nitrite: NEGATIVE
Protein, ur: NEGATIVE mg/dL
Specific Gravity, Urine: 1.02 (ref 1.005–1.030)
pH: 5.5 (ref 5.0–8.0)

## 2020-12-11 LAB — CBC
HCT: 37 % — ABNORMAL LOW (ref 39.0–52.0)
Hemoglobin: 11.4 g/dL — ABNORMAL LOW (ref 13.0–17.0)
MCH: 25.7 pg — ABNORMAL LOW (ref 26.0–34.0)
MCHC: 30.8 g/dL (ref 30.0–36.0)
MCV: 83.3 fL (ref 80.0–100.0)
Platelets: 422 10*3/uL — ABNORMAL HIGH (ref 150–400)
RBC: 4.44 MIL/uL (ref 4.22–5.81)
RDW: 19.2 % — ABNORMAL HIGH (ref 11.5–15.5)
WBC: 16.4 10*3/uL — ABNORMAL HIGH (ref 4.0–10.5)
nRBC: 0 % (ref 0.0–0.2)

## 2020-12-11 LAB — COMPREHENSIVE METABOLIC PANEL
ALT: 22 U/L (ref 0–44)
AST: 39 U/L (ref 15–41)
Albumin: 2.7 g/dL — ABNORMAL LOW (ref 3.5–5.0)
Alkaline Phosphatase: 124 U/L (ref 38–126)
Anion gap: 23 — ABNORMAL HIGH (ref 5–15)
BUN: 138 mg/dL — ABNORMAL HIGH (ref 8–23)
CO2: 26 mmol/L (ref 22–32)
Calcium: 8.6 mg/dL — ABNORMAL LOW (ref 8.9–10.3)
Chloride: 86 mmol/L — ABNORMAL LOW (ref 98–111)
Creatinine, Ser: 3.96 mg/dL — ABNORMAL HIGH (ref 0.61–1.24)
GFR, Estimated: 15 mL/min — ABNORMAL LOW (ref >60)
Glucose, Bld: 168 mg/dL — ABNORMAL HIGH (ref 70–99)
Potassium: 2.5 mmol/L — CL (ref 3.5–5.1)
Sodium: 135 mmol/L (ref 135–145)
Total Bilirubin: 1.1 mg/dL (ref 0.3–1.2)
Total Protein: 7 g/dL (ref 6.5–8.1)

## 2020-12-11 LAB — BRAIN NATRIURETIC PEPTIDE: B Natriuretic Peptide: 1858.3 pg/mL — ABNORMAL HIGH (ref 0.0–100.0)

## 2020-12-11 LAB — BASIC METABOLIC PANEL
Anion gap: 19 — ABNORMAL HIGH (ref 5–15)
BUN: 133 mg/dL — ABNORMAL HIGH (ref 8–23)
CO2: 24 mmol/L (ref 22–32)
Calcium: 7.8 mg/dL — ABNORMAL LOW (ref 8.9–10.3)
Chloride: 91 mmol/L — ABNORMAL LOW (ref 98–111)
Creatinine, Ser: 3.56 mg/dL — ABNORMAL HIGH (ref 0.61–1.24)
GFR, Estimated: 17 mL/min — ABNORMAL LOW (ref >60)
Glucose, Bld: 125 mg/dL — ABNORMAL HIGH (ref 70–99)
Potassium: 2.8 mmol/L — ABNORMAL LOW (ref 3.5–5.1)
Sodium: 134 mmol/L — ABNORMAL LOW (ref 135–145)

## 2020-12-11 LAB — POC SARS CORONAVIRUS 2 AG -  ED: SARS Coronavirus 2 Ag: NEGATIVE

## 2020-12-11 LAB — TROPONIN I (HIGH SENSITIVITY): Troponin I (High Sensitivity): 185 ng/L (ref <18)

## 2020-12-11 LAB — TSH: TSH: 1.018 u[IU]/mL (ref 0.350–4.500)

## 2020-12-11 LAB — MAGNESIUM: Magnesium: 2.5 mg/dL — ABNORMAL HIGH (ref 1.7–2.4)

## 2020-12-11 LAB — CBG MONITORING, ED: Glucose-Capillary: 119 mg/dL — ABNORMAL HIGH (ref 70–99)

## 2020-12-11 LAB — LACTIC ACID, PLASMA: Lactic Acid, Venous: 1 mmol/L (ref 0.5–1.9)

## 2020-12-11 LAB — PROCALCITONIN: Procalcitonin: 4.5 ng/mL

## 2020-12-11 LAB — HEMOGLOBIN A1C
Hgb A1c MFr Bld: 6.8 % — ABNORMAL HIGH (ref 4.8–5.6)
Mean Plasma Glucose: 148.46 mg/dL

## 2020-12-11 LAB — LIPASE, BLOOD: Lipase: 58 U/L — ABNORMAL HIGH (ref 11–51)

## 2020-12-11 MED ORDER — POLYETHYLENE GLYCOL 3350 17 G PO PACK
17.0000 g | PACK | Freq: Every day | ORAL | Status: DC
  Administered 2020-12-11 – 2020-12-19 (×9): 17 g via ORAL
  Filled 2020-12-11 (×9): qty 1

## 2020-12-11 MED ORDER — HEPARIN SODIUM (PORCINE) 5000 UNIT/ML IJ SOLN: 5000.0000 [IU] | Freq: Three times a day (TID) | INTRAMUSCULAR | Status: DC

## 2020-12-11 MED ORDER — LACTATED RINGERS IV SOLN: INTRAVENOUS | Status: DC

## 2020-12-11 MED ORDER — POTASSIUM CHLORIDE IN NACL 20-0.9 MEQ/L-% IV SOLN
Freq: Once | INTRAVENOUS | Status: AC
  Filled 2020-12-11: qty 1000

## 2020-12-11 MED ORDER — ACETAMINOPHEN 325 MG PO TABS
650.0000 mg | ORAL_TABLET | Freq: Four times a day (QID) | ORAL | Status: DC | PRN
  Administered 2020-12-14 – 2020-12-15 (×2): 650 mg via ORAL
  Filled 2020-12-11: qty 2

## 2020-12-11 MED ORDER — VANCOMYCIN VARIABLE DOSE PER UNSTABLE RENAL FUNCTION (PHARMACIST DOSING): Status: DC

## 2020-12-11 MED ORDER — POTASSIUM CHLORIDE 10 MEQ/100ML IV SOLN
10.0000 meq | INTRAVENOUS | Status: AC
  Administered 2020-12-11 (×4): 10 meq via INTRAVENOUS
  Filled 2020-12-11 (×4): qty 100

## 2020-12-11 MED ORDER — VANCOMYCIN HCL 10 G IV SOLR
2500.0000 mg | Freq: Once | INTRAVENOUS | Status: AC
  Administered 2020-12-11: 2500 mg via INTRAVENOUS
  Filled 2020-12-11: qty 2500

## 2020-12-11 MED ORDER — SODIUM CHLORIDE 0.9 % IV SOLN
2.0000 g | INTRAVENOUS | Status: DC
  Administered 2020-12-12: 2 g via INTRAVENOUS
  Filled 2020-12-11: qty 2

## 2020-12-11 MED ORDER — SODIUM CHLORIDE 0.9% FLUSH
3.0000 mL | Freq: Two times a day (BID) | INTRAVENOUS | Status: DC
  Administered 2020-12-13 – 2020-12-19 (×10): 3 mL via INTRAVENOUS

## 2020-12-11 MED ORDER — INSULIN ASPART 100 UNIT/ML ~~LOC~~ SOLN
0.0000 [IU] | SUBCUTANEOUS | Status: DC
  Administered 2020-12-12 (×2): 2 [IU] via SUBCUTANEOUS
  Administered 2020-12-13 (×2): 3 [IU] via SUBCUTANEOUS
  Administered 2020-12-13: 2 [IU] via SUBCUTANEOUS
  Administered 2020-12-13: 3 [IU] via SUBCUTANEOUS
  Administered 2020-12-13 – 2020-12-14 (×2): 2 [IU] via SUBCUTANEOUS
  Administered 2020-12-14: 3 [IU] via SUBCUTANEOUS
  Administered 2020-12-14 (×2): 2 [IU] via SUBCUTANEOUS
  Administered 2020-12-15: 3 [IU] via SUBCUTANEOUS
  Administered 2020-12-15: 2 [IU] via SUBCUTANEOUS
  Administered 2020-12-15: 3 [IU] via SUBCUTANEOUS
  Administered 2020-12-15: 2 [IU] via SUBCUTANEOUS

## 2020-12-11 MED ORDER — ACETAMINOPHEN 650 MG RE SUPP: 650.0000 mg | Freq: Four times a day (QID) | RECTAL | Status: DC | PRN

## 2020-12-11 MED ORDER — SODIUM CHLORIDE 0.9 % IV SOLN
2.0000 g | Freq: Once | INTRAVENOUS | Status: AC
  Administered 2020-12-11: 2 g via INTRAVENOUS
  Filled 2020-12-11: qty 2

## 2020-12-11 MED ORDER — VANCOMYCIN HCL 1000 MG/200ML IV SOLN: 1000.0000 mg | Freq: Once | INTRAVENOUS | Status: DC

## 2020-12-11 MED ORDER — SODIUM CHLORIDE 0.9 % IV BOLUS: 500.0000 mL | Freq: Once | INTRAVENOUS | Status: DC

## 2020-12-11 MED ORDER — METHYLPREDNISOLONE SODIUM SUCC 125 MG IJ SOLR
60.0000 mg | Freq: Two times a day (BID) | INTRAMUSCULAR | Status: DC
  Administered 2020-12-12 (×2): 60 mg via INTRAVENOUS
  Filled 2020-12-11 (×2): qty 2

## 2020-12-11 MED ORDER — HEPARIN (PORCINE) 25000 UT/250ML-% IV SOLN
1650.0000 [IU]/h | INTRAVENOUS | Status: DC
  Administered 2020-12-11: 1650 [IU]/h via INTRAVENOUS
  Filled 2020-12-11: qty 250

## 2020-12-11 MED ORDER — METRONIDAZOLE IN NACL 5-0.79 MG/ML-% IV SOLN
500.0000 mg | Freq: Three times a day (TID) | INTRAVENOUS | Status: DC
  Administered 2020-12-11 – 2020-12-13 (×6): 500 mg via INTRAVENOUS
  Filled 2020-12-11 (×6): qty 100

## 2020-12-11 MED ORDER — SODIUM CHLORIDE 0.9 % IV SOLN: 2.0000 g | Freq: Once | INTRAVENOUS | Status: DC

## 2020-12-11 NOTE — ED Notes (Signed)
Pt being transported to Ct at this time

## 2020-12-11 NOTE — Progress Notes (Signed)
Following for code sepsis

## 2020-12-11 NOTE — H&P (Signed)
History and Physical    Trenell Concannon  YCX:[id number]  DOB: Apr 17, 1946  DOA: 12/11/2020  PCP: [name], MD Patient coming from: home  Chief Complaint: AMS, abdominal pain and bloating  HPI:  Mr. Zagal is a 75 yo male with PMH chronic dCHF, CKD4, afib (on Eliquis), COPD (on chronic 3-4 L O2), OHS/OSA, HTN, HLD, pulmonary HTN, DMII who presented to the ER with abdominal distention/bloating and pain that had been going on for approximately 2 weeks.  He also had associated nausea/vomiting.  He was very lethargic in the ER but able to provide some information.  He endorsed no chest pain or worsening shortness of breath despite imaging findings and only endorsed a mild nonproductive cough. He states he is on diuretics at home but states he was not taking them recently; he has also had poor appetite. Unable to obtain any further review of systems due to significant lethargy and patient requiring almost a sternal rub to stay awake.  Of note, he was recently hospitalized December 2021 for CHF exacerbation and improved after aggressive diuresis.  CXR obtained in the ER showed persistent vascular congestion and large left-sided airspace opacity.  This was concerning for a large left-sided pneumonia.  CT head negative for acute abnormalities. CT abdomen/pelvis showed large stool burden notably in the rectosigmoid region consistent with severe constipation.  Also lung bases were seen which showed dense consolidation in the left lower lobe.  Small right pleural effusion noted.  Right lobe pulmonary nodule also seen with recommended CT chest in 3 months.  Also showed distended gallbladder without evidence of acute cholecystitis.  Notable labs included: Na 135, K 2.5, CL 86, CO2 26, Glucose 168, BUN 138, Creat 3.96 WBC 16.4, Hgb 11.4, Hct 37, PLTC 422 Covid negative  Lipase 58 BNP 1858 UA: Nitrite negative, LE negative Lactic acid pending  He received vanc and cefepime in the ER.  Blood  cultures were ordered.   I have personally briefly reviewed patient's old medical records in [facility] and discussed patient with the ER provider when appropriate/indicated.  Assessment/Plan: * Severe sepsis (HCC) -Tachypnea, leukocytosis, decreased mentation/encephalopathy; source considered left-sided pneumonia -Given vancomycin and cefepime in the ER.  Adding on Flagyl given increased risk for possible aspiration in setting of his lethargy/encephalopathy.  May also be some possible abdominal pathology given distended colon/large stool burden/distended gallbladder -Follow-up lactic which is pending -CURB 65 = 4 points.  Place in PCU.  Also starting steroids; BP also low/normal -Depending on clinical response, may still need CT chest if worsening  Abdominal pain -Patient certainly tender to light palpation throughout abdomen.  He has a very large stool burden appreciated on CT abdomen/pelvis -Gallbladder is also distended but no signs of acute cholecystitis; this may be congestion from CHF -Follow-up renal ultrasound as well -Needs aggressive laxative regimen.  Avoiding Fleet enema due to renal failure, soapsuds enema ordered and MiraLAX.  Further regimen to be initiated pending response  Acute renal failure superimposed on stage 4 chronic kidney disease ([place]) - patient has history of CKD4. Baseline creat ~ 2.3, eGFR 25-29 - patient presents with increase in creat >0.3 mg/dL above baseline, creat increase >1.5x baseline presumed to have occurred within past 7 days PTA - creat 3.96 and BUN 138 on admission -Discussed on the phone with nephrology while patient was in the ER.  Tentative plan at this time is to give patient a fluid challenge overnight and if worsening renal function tomorrow then to formally consult -Renal ultrasound  ordered -Severe constipation/stool burden may also be contributing to possible urinary retention -Insert Foley for definitive UOP measurement and to rule  out obstruction   Acute on chronic systolic CHF (congestive heart failure) ([place]) -Hospitalization in December for similar requiring diuresis - prior notes reviewed: chronic systolic failure transitioned to [facility] with prominent RV failure -BNP (1858) likely has some confounding from underlying worsening renal failure however CXR does show some volume overload but no obvious peripheral edema seen on exam - for now will use IVF given severe sepsis with close monitoring of fluid and respiratory status - monitor I&O - EKG shows flutter waves and ST depression; discussed with cardiology; given his volume status being difficult to manage with possible need for fluids and/or diuresis along with hypotension he may be in possible need of inotropic support depending on how he does over the next 12-24 hours after IVF started.He does have a history of cardiogenic shock as well requiring milrinone  - have asked for cardiology input and to assist with management  Acute metabolic encephalopathy - patient symptoms include lethargy/decreased mentation - etiology considered due to metabolic and septic due to uremia and PNA respectively  Diabetes mellitus type 2 in obese (HCC) - SSI and CBG monitoring - q4h for now until alert then can change to achs  Hypokalemia - s/p NS w/ 20 KCL in ER as well as KCL runs x 4 - check Mg - repeat BMP after above supplementation and replete further if needed  Chronic respiratory failure with hypoxia (HCC) - baseline O2 3-4L at home; currently at baseline but has potential for worsening given large left-sided pneumonia  COPD (chronic obstructive pulmonary disease) ([place]) - no s/s exacerbation at this time - continue chronic O2, 3-4L at home - continue abx and steroids in setting of sepsis  PAF (paroxysmal atrial fibrillation) ([place]) -Hold Eliquis for now in setting of severe encephalopathy and does not appear safe for orals as well as renal function -Use heparin subq -  resume Eliquis when able   Essential hypertension - hold home BP meds for now in setting of sepsis and hypotension     Code Status:     Code Status: Full Code  DVT Prophylaxis:   heparin injection 5,000 Units Start: 12/11/20 2200   Anticipated disposition is to: home  History: Past Medical History:  Diagnosis Date  . Atrial flutter ([place])    a. recurrent AFlutter with RVR;  b. Amiodarone Rx started 4/16  . CAD (coronary artery disease)    a. LHC 1/16:  mLAD diffuse disease, pLCx mild disease, dLCx with disease but too small for PCI, RCA ok, EF 25-30%  . Chronic pain   . Chronic systolic CHF (congestive heart failure) ([place])   . COPD (chronic obstructive pulmonary disease) ([place])   . Diabetes mellitus without complication ([place])   . Ex-smoker 11/2017  . Hypercholesteremia   . Hypertension   . NICM (nonischemic cardiomyopathy) ([place])    a.dx 2016. b. 2D echo 06/2016 - Last echo 07/01/16: mod dilated LV, mod LVH, EF 25-30%, mild-mod MR, sev LAE, mild-mod reduced RV systolic function, mild-mod TR, PASP 64mHG.  .Marland KitchenPAF (paroxysmal atrial fibrillation) (HCC)    On amio - ot a candidate for flecainide due to cardiomyopathy, not a candidate for Tikosyn due to prolonged QT, and felt to be a poor candidate for ablation given left atrial size.  . Pulmonary hypertension ([place]   . Tobacco abuse     Past Surgical History:  Procedure Laterality  Date  . BIOPSY  03/11/2019   Procedure: BIOPSY;  Surgeon: Lavena Bullion, DO;  Location: [place];  Service: Gastroenterology;;  . BIOPSY  03/15/2019   Procedure: BIOPSY;  Surgeon: Irving Copas., MD;  Location: [place];  Service: Gastroenterology;;  . CARDIOVERSION N/A 07/30/2018   Procedure: CARDIOVERSION;  Surgeon: Larey Dresser, MD;  Location: [facility] ENDOSCOPY;  Service: Cardiovascular;  Laterality: N/A;  . COLONOSCOPY N/A 03/11/2019   Procedure: COLONOSCOPY;  Surgeon: Lavena Bullion, DO;  Location: [place];  Service:  Gastroenterology;  Laterality: N/A;  . ENTEROSCOPY N/A 03/15/2019   Procedure: ENTEROSCOPY;  Surgeon: Rush Landmark Telford Nab., MD;  Location: [place];  Service: Gastroenterology;  Laterality: N/A;  . ESOPHAGOGASTRODUODENOSCOPY Left 03/11/2019   Procedure: ESOPHAGOGASTRODUODENOSCOPY (EGD);  Surgeon: Lavena Bullion, DO;  Location: [facility] ENDOSCOPY;  Service: Gastroenterology;  Laterality: Left;  . LEFT AND RIGHT HEART CATHETERIZATION WITH CORONARY ANGIOGRAM N/A 11/06/2014   Procedure: LEFT AND RIGHT HEART CATHETERIZATION WITH CORONARY ANGIOGRAM;  Surgeon: Jettie Booze, MD;  Location[facility] CATH LAB;  Service: Cardiovascular;  Laterality: N/A;  . RIGHT/LEFT HEART CATH AND CORONARY ANGIOGRAPHY N/A 12/06/2018   Procedure: RIGHT/LEFT HEART CATH AND CORONARY ANGIOGRAPHY;  Surgeon: Larey Dresser, MD;  Location: [place] CV LAB;  Service: Cardiovascular;  Laterality: N/A;  . SPLENECTOMY    . SUBMUCOSAL TATTOO INJECTION  03/15/2019   Procedure: SUBMUCOSAL TATTOO INJECTION;  Surgeon: Rush Landmark Telford Nab., MD;  Location: [place];  Service: Gastroenterology;;     reports that he quit smoking about 2 years ago. His smoking use included cigarettes. He has a 34.00 pack-year smoking history. He has never used smokeless tobacco. He reports that he does not drink alcohol and does not use drugs.  Allergies  Allergen Reactions  . Isosorb Dinitrate-Hydralazine Shortness Of Breath  . Benazepril Nausea And Vomiting  . Brimonidine Other (See Comments)    Dry eyes  . Metformin Other (See Comments)    Cramps and abdominal pain  . Morphine Rash    Family History  Problem Relation Age of Onset  . Heart disease Mother   . Hypertension Mother   . Heart failure Mother   . Heart disease Father   . Hypertension Sister   . Heart attack Neg Hx   . Stroke Neg Hx   . Colon cancer Neg Hx   . Esophageal cancer Neg Hx    Home Medications: Prior to Admission medications   Medication Sig Start Date End  Date Taking? Authorizing Provider  albuterol (VENTOLIN HFA) 108 (90 Base) MCG/ACT inhaler Inhale 2 puffs into the lungs every 6 (six) hours as needed for wheezing or shortness of breath.   Yes [provider]  amiodarone (PACERONE) 200 MG tablet Take 0.5 tablets (100 mg total) by mouth daily. 08/28/20  Yes Larey Dresser, MD  apixaban (ELIQUIS) 2.5 MG TABS tablet Take 1 tablet (2.5 mg total) by mouth 2 (two) times daily. 11/01/20 01/30/21 Yes Kayleen Memos, DO  ascorbic acid (VITAMIN C) 500 MG tablet Take 500 mg by mouth every Monday, Wednesday, and Friday at 6 PM. With Ferrous sulfate   Yes [provider]  carbamide peroxide (DEBROX) 6.5 % OTIC solution Place 5-10 drops into both ears 3 (three) times daily as needed (ear wax).   Yes [provider]  Carboxymethylcellulose Sod PF 0.25 % SOLN Place 1 drop into both eyes 4 (four) times daily.   Yes [provider]  Carboxymethylcellulose Sodium 0.25 % SOLN INSTILL  1 DROP IN BOTH EYES FOUR TIMES A DAY 03/09/20  Yes [provider]  Cholecalciferol (VITAMIN D) 50 MCG (2000 UT) CAPS Take 2,000 Units by mouth daily.   Yes [provider]  diazepam (VALIUM) 10 MG tablet Take 10 mg by mouth 3 (three) times daily as needed for seizure. 11/22/20  Yes [provider]  famotidine (PEPCID) 20 MG tablet TAKE 1 TABLET(20 MG) BY MOUTH TWICE DAILY Patient taking differently: Take 20 mg by mouth 2 (two) times daily. 10/29/20  Yes Larey Dresser, MD  ferrous sulfate 325 (65 FE) MG tablet Take 325 mg by mouth every Monday, Wednesday, and Friday at 6 PM.   Yes [provider]  fluticasone (FLONASE) 50 MCG/ACT nasal spray Place 2 sprays into both nostrils daily.   Yes [provider]  gabapentin (NEURONTIN) 300 MG capsule Take 300 mg by mouth at bedtime. 12/03/20  Yes [provider]  hydrocortisone (ANUSOL-HC) 2.5 % rectal cream Place 1 application rectally 2 (two) times daily as  needed for hemorrhoids or itching.    Yes [provider]  hydrocortisone 1 % lotion Apply 1 application topically 2 (two) times daily. Back   Yes [provider]  insulin aspart (NOVOLOG) 100 UNIT/ML injection Inject 0-15 Units into the skin 3 (three) times daily with meals. Patient taking differently: Inject 4 Units into the skin 3 (three) times daily with meals. 10/09/20  Yes Vann, Jessica U, DO  insulin aspart protamine- aspart (NOVOLOG MIX 70/30) (70-30) 100 UNIT/ML injection Inject 10 Units into the skin 2 (two) times daily with a meal.   Yes [provider]  ipratropium-albuterol (DUONEB) 0.5-2.5 (3) MG/3ML SOLN Take 3 mLs by nebulization 4 (four) times daily as needed.   Yes [provider]  senna-docusate (SENOKOT-S) 8.6-50 MG tablet Take 1 tablet by mouth at bedtime as needed. 07/03/20  Yes [provider]  acetaminophen (TYLENOL) 325 MG tablet Take 2 tablets (650 mg total) by mouth every 4 (four) hours as needed for headache or mild pain. 10/09/20   Geradine Girt, DO  albuterol (PROVENTIL) (2.5 MG/3ML) 0.083% nebulizer solution Take 3 mLs (2.5 mg total) by nebulization every 4 (four) hours as needed for wheezing or shortness of breath. 06/21/20   Tanda Rockers, MD  atorvastatin (LIPITOR) 40 MG tablet Take 1 tablet (40 mg total) by mouth daily. Patient taking differently: Take 40 mg by mouth at bedtime. 10/30/20   Larey Dresser, MD  Budeson-Glycopyrrol-Formoterol (BREZTRI AEROSPHERE) 160-9-4.8 MCG/ACT AERO Inhale 2 puffs into the lungs 2 (two) times daily. 06/21/20   Tanda Rockers, MD  cetirizine (ZYRTEC) 10 MG tablet Take 10 mg by mouth at bedtime.    [provider]  doxycycline (VIBRA-TABS) 100 MG tablet Take 1 tablet (100 mg total) by mouth every 12 (twelve) hours. 11/27/20   Swayze, Ava, DO  feeding supplement, GLUCERNA SHAKE, (GLUCERNA SHAKE) LIQD Take 237 mLs by mouth 3 (three) times daily between meals. 11/27/20   Swayze, Ava, DO   Insulin Syringe 27G X 1/2" 0.5 ML MISC 100 Syringes by Does not apply route 3 (three) times daily. 08/29/19   Hosie Poisson, MD  latanoprost (XALATAN) 0.005 % ophthalmic solution Place 1 drop into both eyes at bedtime.    [provider]  lidocaine (LIDODERM) 5 % Place 1 patch onto the skin daily. Remove & Discard patch within 12 hours or as directed by MD 10/10/20   Geradine Girt, DO  metolazone (ZAROXOLYN) 2.5  MG tablet Take 2.5 mg by mouth See admin instructions. 1 tablet on Monday and Wednesday as needed  for weight gain of 3 pounds in 24 hours    [provider]  oxyCODONE-acetaminophen (PERCOCET) 10-325 MG tablet Take 1 tablet by mouth every 6 (six) hours as needed for pain.    [provider]  OXYGEN Inhale 3 L/min into the lungs continuous.    [provider]  potassium chloride SA (KLOR-CON) 20 MEQ tablet Take 1 tablet (20 mEq total) by mouth 2 (two) times daily. 11/09/20 12/09/20  Rafael Bihari, FNP  PRESCRIPTION MEDICATION CPAP    [provider]  silver sulfADIAZINE (SILVADENE) 1 % cream Apply topically daily. 11/01/20   Kayleen Memos, DO  simethicone (MYLICON) 80 MG chewable tablet Chew 1 tablet (80 mg total) by mouth every 6 (six) hours as needed for flatulence. 11/01/20   Kayleen Memos, DO  sodium chloride (OCEAN) 0.65 % SOLN nasal spray Place 2 sprays into both nostrils as needed for congestion.     [provider]  tamsulosin (FLOMAX) 0.4 MG CAPS capsule Take 1 capsule (0.4 mg total) by mouth daily. Patient taking differently: Take 0.4 mg by mouth at bedtime. 10/09/20   Geradine Girt, DO  torsemide (DEMADEX) 20 MG tablet Take 4 tablets (80 mg total) by mouth 2 (two) times daily. 11/18/19   Larey Dresser, MD  umeclidinium bromide (INCRUSE ELLIPTA) 62.5 MCG/INH AEPB Inhale 1 puff into the lungs daily. 11/28/20   Swayze, Ava, DO  zinc oxide 20 % ointment Apply 1 application topically 2 (two) times daily as needed for  irritation (rash).     [provider]    Review of Systems:  Review of systems not obtained due to patient factors. cognitive impairment  Physical Exam: Vitals:   12/11/20 1630 12/11/20 1645 12/11/20 1700 12/11/20 1714  BP: (!) 105/56 (!) 97/53 (!) 104/59   Pulse:  67 69   Resp: _0 Temp:  98.4 F (36.9 C)    TempSrc:  Oral    SpO2:  100% 100% 100%  Weight:      Height:       General appearance: Chronically ill-appearing elderly man lying in bed very lethargic and difficult to arouse requiring sternal rubs at times.  Is able to answer some questions but falls back asleep easily.  He is in no obvious distress Head: Normocephalic, without obvious abnormality, atraumatic Eyes: EOMI, PERRL Lungs: No wheezing appreciated.  Breath sounds significantly decreased on the left side. Heart: regular rate and rhythm and S1, S2 normal Abdomen: Severely distended and tender to light palpation.  Bowel sounds hypoactive. Extremities: Trace lower extremity edema, no obvious volume overload appreciated Skin: mobility and turgor normal Neurologic: Moves all 4 extremities and does follow most commands oriented to name only.  Labs on Admission:  I have personally reviewed following labs and imaging studies Results for orders placed or performed during the hospital encounter of 12/11/20 (from the past 24 hour(s))  Lipase, blood     Status: Abnormal   Collection Time: 12/11/20  7:30 AM  Result Value Ref Range   Lipase 58 (H) 11 - 51 U/L  Comprehensive metabolic panel     Status: Abnormal   Collection Time: 12/11/20  7:30 AM  Result Value Ref Range   Sodium 135 135 - 145 mmol/L   Potassium 2.5 (LL) 3.5 - 5.1 mmol/L   Chloride 86 (L) 98 - 111 mmol/L  CO2 26 22 - 32 mmol/L   Glucose, Bld 168 (H) 70 - 99 mg/dL   BUN 138 (H) 8 - 23 mg/dL   Creatinine, Ser 3.96 (H) 0.61 - 1.24 mg/dL   Calcium 8.6 (L) 8.9 - 10.3 mg/dL   Total Protein 7.0 6.5 - 8.1 g/dL   Albumin 2.7 (L) 3.5 - 5.0  g/dL   AST 39 15 - 41 U/L   ALT 22 0 - 44 U/L   Alkaline Phosphatase 124 38 - 126 U/L   Total Bilirubin 1.1 0.3 - 1.2 mg/dL   GFR, Estimated 15 (L) >60 mL/min   Anion gap 23 (H) 5 - 15  CBC     Status: Abnormal   Collection Time: 12/11/20  7:30 AM  Result Value Ref Range   WBC 16.4 (H) 4.0 - 10.5 K/uL   RBC 4.44 4.22 - 5.81 MIL/uL   Hemoglobin 11.4 (L) 13.0 - 17.0 g/dL   HCT 37.0 (L) 39.0 - 52.0 %   MCV 83.3 80.0 - 100.0 fL   MCH 25.7 (L) 26.0 - 34.0 pg   MCHC 30.8 30.0 - 36.0 g/dL   RDW 19.2 (H) 11.5 - 15.5 %   Platelets 422 (H) 150 - 400 K/uL   nRBC 0.0 0.0 - 0.2 %  Brain natriuretic peptide     Status: Abnormal   Collection Time: 12/11/20  7:30 AM  Result Value Ref Range   B Natriuretic Peptide 1,858.3 (H) 0.0 - 100.0 pg/mL  Urinalysis, Routine w reflex microscopic Urine, Random     Status: Abnormal   Collection Time: 12/11/20 11:07 AM  Result Value Ref Range   Color, Urine YELLOW YELLOW   APPearance HAZY (A) CLEAR   Specific Gravity, Urine 1.020 1.005 - 1.030   pH 5.5 5.0 - 8.0   Glucose, UA NEGATIVE NEGATIVE mg/dL   Hgb urine dipstick NEGATIVE NEGATIVE   Bilirubin Urine NEGATIVE NEGATIVE   Ketones, ur NEGATIVE NEGATIVE mg/dL   Protein, ur NEGATIVE NEGATIVE mg/dL   Nitrite NEGATIVE NEGATIVE   Leukocytes,Ua NEGATIVE NEGATIVE  POC SARS Coronavirus 2 Ag-ED - Nasal Swab     Status: None   Collection Time: 12/11/20 12:17 PM  Result Value Ref Range   SARS Coronavirus 2 Ag NEGATIVE NEGATIVE  Lactic acid, plasma     Status: None   Collection Time: 12/11/20  4:50 PM  Result Value Ref Range   Lactic Acid, Venous 1.0 0.5 - 1.9 mmol/L     Radiological Exams on Admission: CT Abdomen Pelvis Wo Contrast  Result Date: 12/11/2020 CLINICAL DATA:  Abdominal distension. Abscess or infection suspected. Nausea and vomiting EXAM: CT ABDOMEN AND PELVIS WITHOUT CONTRAST TECHNIQUE: Multidetector CT imaging of the abdomen and pelvis was performed following the standard protocol without IV  contrast. COMPARISON:  CT 10/05/2020 CT 01/03/2019 FINDINGS: Lower chest: Dense consolidation LEFT lung base. Interlobular septal thickening at the RIGHT lung base. 7 mm nodule in the RIGHT lower lobe on image 7/4. Interval improvement in RIGHT pleural effusion. LEFT basilar consolidation is worse. Hepatobiliary: No focal hepatic lesion on noncontrast exam. Gallbladder is distended to 5.7 cm. No radiodense gallstones. No gallbladder inflammation. Intrahepatic duct dilatation on noncontrast exam Pancreas: Pancreas is normal. No ductal dilatation. No pancreatic inflammation. Spleen: Multiple splenules in the LEFT upper quadrant. Adrenals/urinary tract: Of LEFT adrenal gland thickening to 19 mm has attenuation greater than benign adenoma. No nephrolithiasis or ureterolithiasis Stomach/Bowel: Stomach, small bowel, appendix, and cecum are normal. Ascending transverse colon normal. There is moderate  volume stool in the descending colon. Large volume stool in the rectosigmoid colon. Stool ball in the rectum measuring 7 cm. Vascular/Lymphatic: Abdominal aorta is normal caliber with atherosclerotic calcification. There is no retroperitoneal or periportal lymphadenopathy. No pelvic lymphadenopathy. Reproductive: Prostate unremarkable Other: No intraperitoneal free air Musculoskeletal: LEFT hip prosthetic.  No acute osseous findings IMPRESSION: 1. Large volume stool in the rectosigmoid suggest constipation. 2. Dense consolidation in the LEFT lower lobe new from comparison exam. 3. Improvement in RIGHT pleural effusion. 4. RIGHT lobe pulmonary nodule. Recommend follow-up CT chest in 3 months. 5. Distended gallbladder without evidence acute cholecystitis. 6. Nodular thickening of the LEFT adrenal gland similar to CT 01/03/2019 Electronically Signed   By: Suzy Bouchard M.D.   On: 12/11/2020 14:28   CT Head Wo Contrast  Result Date: 12/11/2020 CLINICAL DATA:  Altered mental status. EXAM: CT HEAD WITHOUT CONTRAST TECHNIQUE:  Contiguous axial images were obtained from the base of the skull through the vertex without intravenous contrast. COMPARISON:  Head CT scan 04/09/2019. FINDINGS: Brain: No evidence of acute infarction, hemorrhage, hydrocephalus, extra-axial collection or mass lesion/mass effect. Chronic microvascular ischemic change and cavum septum pellucidum et vergae noted. Vascular: No hyperdense vessel.  Atherosclerosis is seen. Skull: Intact.  No focal lesion. Sinuses/Orbits: Negative. Other: None. IMPRESSION: No acute abnormality. Chronic microvascular ischemic change. Atherosclerosis. Electronically Signed   By: Inge Rise M.D.   On: 12/11/2020 14:00   DG Chest Port 1 View  Result Date: 12/11/2020 CLINICAL DATA:  Weakness EXAM: PORTABLE CHEST 1 VIEW COMPARISON:  11/21/2020 FINDINGS: Cardiac shadow is enlarged but stable. Aortic calcifications are again seen. Persistent left basilar airspace disease with associated effusion is noted. Some interval clearing in the right base is noted. Persistent central vascular congestion is seen. IMPRESSION: Persistent vascular congestion. Stable left basilar airspace opacity with a fusion although clearing in the right base is noted. Electronically Signed   By: Inez Catalina M.D.   On: 12/11/2020 12:02   CT Abdomen Pelvis Wo Contrast  Final Result    CT Head Wo Contrast  Final Result    DG Chest [facility] 1 View  Final Result    US RENAL    (Results Pending)    Consults called:  Talked to nephrology on the phone.  They recommended fluids and formal consult in a.m. if renal function not improving  EKG: Independently reviewed. Flutter waves noted in V2, RBBB, QTc 563.  ST depression noted in V4-V6 (not seen on recent prior EKGs). Right axis deviation   Dwyane Dee, MD [facility] 12/11/2020, 5:21 PM

## 2020-12-11 NOTE — Consult Note (Addendum)
Cardiology Consultation:   Patient ID: Nicholas Caldwell MRN: [id number]; DOB: 06/07/1946  Admit date: 12/11/2020 Date of Consult: 12/11/2020  Primary Care Provider: [name], MD [facility] Cardiologist: Dr. [facility] Electrophysiologist:  None    Patient Profile:   Nicholas Caldwell is a 75 y.o. male with a hx of chronic [facility], severe pulmonary HTN, COPD with chronic respiratory failure on home O2, DM, nonobstructive CAD by cath 12/2018, HTN, OHS/OSA on CPAP, CKD stage IV, PAF, hyperlipidemia, CAD, chronic back pain, noncompliance who is being seen today for the evaluation of abnormal EKG at the request of Dr. Sabino Gasser.  History of Present Illness:   Nicholas Caldwell has been followed by the advanced CHF team with multiple admissions due to massive volume overload, HF and respiratory failure, as well as a multitude of other medical issues including afib, penile wound, CVA, UTI, COPD, pneumonia, pleural effusion requiring thoracentesis, PEA arrest 08/2019 (felt due to narcotic pain medicine and underuse of BiPAP), sacral pressure ulcers, and PAF requiring prior cardioversion/amiodarone. Palliative care has previously been introduced. He was last seen by the CHF team in December during admission with acute on chronic CHF with prominent RV failure felt due to COPD, OHS, OSA with poor compliance with home O2 and BiPAP. He also had acute hypoxic/hypercarbic respiratory failure, AKI on CKD stage IV, aspiration PNA, and transient rapid atrial fib. He has failed amiodarone in the past and cannot take Tikosyn due to prolonged QT. He was back on low dose amiodarone that admission and maintaining NSR later in his admission. He has been admitted twice more since then for similar issues.   He presented back to the hospital with abdominal pain, bloating, nausea and vomiting for several weeks. Last threw up 2 days ago. While in the ER he was very lethargic and tachypneic.  CT head negative. CXR persistent vascular congestion and large left-sided airspace opacity concerning for large left sided PNA. CT abd/pelvis with left lung consolidation, large stool burden, small right pleural effusion, right lobe pulmonary nodule needing f/u if appropriate 3 months, and distended gallbladder. Diagnosed with severe sepsis with a multitude of other issues including AKI superimposed on CKD with BUN 138/Cr 3.96, severe hypokalemia, leukocytosis. Eliquis is being held for severe encephalopathy (not currently safe for orals). EKG showed he is back in atrial flutter therefore we are consulted. He is being treated with antibiotics and fluids and beginning to perk up. Denies active palpitations, chest pain or worsening dyspnea. Reports compliance with meds.   Past Medical History:  Diagnosis Date  . Atrial flutter ([place])    a. recurrent AFlutter with RVR;  b. Amiodarone Rx started 4/16  . CAD (coronary artery disease)    a. LHC 1/16:  mLAD diffuse disease, pLCx mild disease, dLCx with disease but too small for PCI, RCA ok, EF 25-30%  . Chronic pain   . Chronic systolic CHF (congestive heart failure) ([place])   . COPD (chronic obstructive pulmonary disease) ([place])   . Diabetes mellitus without complication ([place])   . Ex-smoker 11/2017  . Hypercholesteremia   . Hypertension   . NICM (nonischemic cardiomyopathy) ([place])    a.dx 2016. b. 2D echo 06/2016 - Last echo 07/01/16: mod dilated LV, mod LVH, EF 25-30%, mild-mod MR, sev LAE, mild-mod reduced RV systolic function, mild-mod TR, PASP 47mHG.  .Marland KitchenPAF (paroxysmal atrial fibrillation) ([facility])    On amio - ot a candidate for flecainide due to cardiomyopathy, not a candidate for Tikosyn  due to prolonged QT, and felt to be a poor candidate for ablation given left atrial size.  . Pulmonary hypertension ([place])   . Tobacco abuse     Past Surgical History:  Procedure Laterality Date  . BIOPSY  03/11/2019   Procedure: BIOPSY;  Surgeon: Lavena Bullion,  DO;  Location: [place];  Service: Gastroenterology;;  . BIOPSY  03/15/2019   Procedure: BIOPSY;  Surgeon: Irving Copas., MD;  Location: [place];  Service: Gastroenterology;;  . CARDIOVERSION N/A 07/30/2018   Procedure: CARDIOVERSION;  Surgeon: Larey Dresser, MD;  Location: [facility] ENDOSCOPY;  Service: Cardiovascular;  Laterality: N/A;  . COLONOSCOPY N/A 03/11/2019   Procedure: COLONOSCOPY;  Surgeon: Lavena Bullion, DO;  Location: [place];  Service: Gastroenterology;  Laterality: N/A;  . ENTEROSCOPY N/A 03/15/2019   Procedure: ENTEROSCOPY;  Surgeon: Rush Landmark Telford Nab., MD;  Location: [place];  Service: Gastroenterology;  Laterality: N/A;  . ESOPHAGOGASTRODUODENOSCOPY Left 03/11/2019   Procedure: ESOPHAGOGASTRODUODENOSCOPY (EGD);  Surgeon: Lavena Bullion, DO;  Location: [facility] ENDOSCOPY;  Service: Gastroenterology;  Laterality: Left;  . LEFT AND RIGHT HEART CATHETERIZATION WITH CORONARY ANGIOGRAM N/A 11/06/2014   Procedure: LEFT AND RIGHT HEART CATHETERIZATION WITH CORONARY ANGIOGRAM;  Surgeon: Jettie Booze, MD;  Location: [facility] CATH LAB;  Service: Cardiovascular;  Laterality: N/A;  . RIGHT/LEFT HEART CATH AND CORONARY ANGIOGRAPHY N/A 12/06/2018   Procedure: RIGHT/LEFT HEART CATH AND CORONARY ANGIOGRAPHY;  Surgeon: Larey Dresser, MD;  Location: [place] CV LAB;  Service: Cardiovascular;  Laterality: N/A;  . SPLENECTOMY    . SUBMUCOSAL TATTOO INJECTION  03/15/2019   Procedure: SUBMUCOSAL TATTOO INJECTION;  Surgeon: Irving Copas., MD;  Location: [place];  Service: Gastroenterology;;     Home Medications:  Prior to Admission medications   Medication Sig Start Date End Date Taking? Authorizing Provider  acetaminophen (TYLENOL) 325 MG tablet Take 2 tablets (650 mg total) by mouth every 4 (four) hours as needed for headache or mild pain. 10/09/20  Yes Vann, Jessica U, DO  albuterol (PROVENTIL) (2.5 MG/3ML) 0.083% nebulizer solution Take 3 mLs (2.5 mg  total) by nebulization every 4 (four) hours as needed for wheezing or shortness of breath. 06/21/20  Yes Tanda Rockers, MD  albuterol (VENTOLIN HFA) 108 (90 Base) MCG/ACT inhaler Inhale 2 puffs into the lungs every 6 (six) hours as needed for wheezing or shortness of breath.   Yes [provider]  amiodarone (PACERONE) 200 MG tablet Take 0.5 tablets (100 mg total) by mouth daily. 08/28/20  Yes Larey Dresser, MD  apixaban (ELIQUIS) 2.5 MG TABS tablet Take 1 tablet (2.5 mg total) by mouth 2 (two) times daily. 11/01/20 01/30/21 Yes Hall, Carole N, DO  atorvastatin (LIPITOR) 40 MG tablet Take 1 tablet (40 mg total) by mouth daily. Patient taking differently: Take 40 mg by mouth at bedtime. 10/30/20  Yes Larey Dresser, MD  Budeson-Glycopyrrol-Formoterol (BREZTRI AEROSPHERE) 160-9-4.8 MCG/ACT AERO Inhale 2 puffs into the lungs 2 (two) times daily. 06/21/20  Yes Tanda Rockers, MD  carbamide peroxide (DEBROX) 6.5 % OTIC solution Place 5-10 drops into both ears 3 (three) times daily as needed (ear wax).   Yes [provider]  Carboxymethylcellulose Sod PF 0.25 % SOLN Place 1 drop into both eyes 4 (four) times daily.   Yes [provider]  cetirizine (ZYRTEC) 10 MG tablet Take 10 mg by mouth at bedtime.   Yes [provider]  Cholecalciferol (VITAMIN D) 50 MCG (2000 UT) CAPS Take 2,000  Units by mouth daily.   Yes [provider]  diazepam (VALIUM) 10 MG tablet Take 10 mg by mouth 3 (three) times daily as needed for seizure. 11/22/20  Yes [provider]  famotidine (PEPCID) 20 MG tablet TAKE 1 TABLET(20 MG) BY MOUTH TWICE DAILY Patient taking differently: Take 20 mg by mouth 2 (two) times daily as needed for indigestion. 10/29/20  Yes Larey Dresser, MD  feeding supplement, GLUCERNA SHAKE, (GLUCERNA SHAKE) LIQD Take 237 mLs by mouth 3 (three) times daily between meals. 11/27/20  Yes Swayze, Ava, DO  ferrous sulfate 325 (65 FE) MG tablet Take 325 mg by  mouth every Monday, Wednesday, and Friday at 6 PM.   Yes [provider]  fluticasone (FLONASE) 50 MCG/ACT nasal spray Place 2 sprays into both nostrils daily.   Yes [provider]  hydrocortisone (ANUSOL-HC) 2.5 % rectal cream Place 1 application rectally 2 (two) times daily as needed for hemorrhoids or itching.    Yes [provider]  hydrocortisone 1 % lotion Apply 1 application topically 2 (two) times daily. Back   Yes [provider]  insulin aspart (NOVOLOG) 100 UNIT/ML injection Inject 0-15 Units into the skin 3 (three) times daily with meals. Patient taking differently: Inject 4 Units into the skin 3 (three) times daily with meals. 10/09/20  Yes Vann, Jessica U, DO  insulin aspart protamine- aspart (NOVOLOG MIX 70/30) (70-30) 100 UNIT/ML injection Inject 10 Units into the skin 2 (two) times daily with a meal.   Yes [provider]  ipratropium-albuterol (DUONEB) 0.5-2.5 (3) MG/3ML SOLN Take 3 mLs by nebulization 4 (four) times daily as needed.   Yes [provider]  latanoprost (XALATAN) 0.005 % ophthalmic solution Place 1 drop into both eyes at bedtime.   Yes [provider]  lidocaine (LIDODERM) 5 % Place 1 patch onto the skin daily. Remove & Discard patch within 12 hours or as directed by MD Patient taking differently: Place 1 patch onto the skin daily as needed (pain). Remove & Discard patch within 12 hours or as directed by MD 10/10/20  Yes Geradine Girt, DO  Magnesium Oxide 420 MG TABS Take 420 mg by mouth daily. 12/24/16  Yes [provider]  oxybutynin (DITROPAN) 5 MG tablet Take 1 tablet by mouth 4 (four) times daily as needed for incontinence. 07/03/20  Yes [provider]  oxyCODONE-acetaminophen (PERCOCET) 10-325 MG tablet Take 1 tablet by mouth every 6 (six) hours as needed for pain.   Yes [provider]  pantoprazole (PROTONIX) 40 MG tablet Take 40 mg by mouth 2 (two) times daily. 07/03/20   Yes [provider]  polyethylene glycol powder (GLYCOLAX/MIRALAX) 17 GM/SCOOP powder Take 17 g by mouth daily as needed for constipation. 07/03/20  Yes [provider]  potassium chloride SA (KLOR-CON) 20 MEQ tablet Take 1 tablet (20 mEq total) by mouth 2 (two) times daily. Patient taking differently: Take 40 mEq by mouth 2 (two) times daily. 11/09/20 12/09/20 Yes Milford, Maricela Bo, FNP  senna-docusate (SENOKOT-S) 8.6-50 MG tablet Take 1 tablet by mouth at bedtime as needed. 07/03/20  Yes [provider]  silver sulfADIAZINE (SILVADENE) 1 % cream Apply topically daily. Patient taking differently: Apply 1 application topically daily. 11/01/20  Yes Kayleen Memos, DO  sodium chloride (OCEAN) 0.65 % SOLN nasal spray Place 2 sprays into both nostrils as needed for congestion.    Yes [provider]  tamsulosin (FLOMAX) 0.4 MG CAPS capsule Take 1  capsule (0.4 mg total) by mouth daily. Patient taking differently: Take 0.4 mg by mouth daily after breakfast. 10/09/20  Yes Vann, Jessica U, DO  terbinafine (LAMISIL) 1 % cream Apply 1 application topically daily. 03/10/20  Yes [provider]  torsemide (DEMADEX) 20 MG tablet Take 4 tablets (80 mg total) by mouth 2 (two) times daily. 11/18/19  Yes Larey Dresser, MD  umeclidinium bromide (INCRUSE ELLIPTA) 62.5 MCG/INH AEPB Inhale 1 puff into the lungs daily. 11/28/20  Yes Swayze, Ava, DO  zinc oxide 20 % ointment Apply 1 application topically 2 (two) times daily.   Yes [provider]  gabapentin (NEURONTIN) 300 MG capsule Take 300 mg by mouth at bedtime. Patient not taking: No sig reported 12/03/20   [provider]  Insulin Syringe 27G X 1/2" 0.5 ML MISC 100 Syringes by Does not apply route 3 (three) times daily. 08/29/19   Hosie Poisson, MD  OXYGEN Inhale 3 L/min into the lungs continuous.    [provider]  simethicone (MYLICON) 80 MG chewable tablet Chew 1 tablet (80 mg total) by mouth  every 6 (six) hours as needed for flatulence. Patient not taking: No sig reported 11/01/20   Kayleen Memos, DO    Inpatient Medications: Scheduled Meds: . heparin  5,000 Units Subcutaneous Q8H  . insulin aspart  0-15 Units Subcutaneous Q4H  . methylPREDNISolone (SOLU-MEDROL) injection  60 mg Intravenous BID  . polyethylene glycol  17 g Oral Daily  . sodium chloride flush  3 mL Intravenous Q12H  . vancomycin variable dose per unstable renal function (pharmacist dosing)   Does not apply See admin instructions   Continuous Infusions: . [START ON 12/12/2020] ceFEPime (MAXIPIME) IV    . lactated ringers 125 mL/hr at 12/11/20 1639  . metronidazole Stopped (12/11/20 1753)  . potassium chloride 10 mEq (12/11/20 1818)  . vancomycin 2,500 mg (12/11/20 1719)   PRN Meds: acetaminophen **OR** acetaminophen  Allergies:    Allergies  Allergen Reactions  . Isosorb Dinitrate-Hydralazine Shortness Of Breath  . Benazepril Nausea And Vomiting  . Brimonidine Other (See Comments)    Dry eyes  . Metformin Other (See Comments)    Cramps and abdominal pain  . Morphine Rash    Social History:   Social History   Socioeconomic History  . Marital status: Married    Spouse name: Not on file  . Number of children: Not on file  . Years of education: Not on file  . Highest education level: Not on file  Occupational History  . Not on file  Tobacco Use  . Smoking status: Former Smoker    Packs/day: 1.00    Years: 34.00    Pack years: 34.00    Types: Cigarettes    Quit date: 11/30/2018    Years since quitting: 2.0  . Smokeless tobacco: Never Used  Vaping Use  . Vaping Use: Never used  Substance and Sexual Activity  . Alcohol use: No    Alcohol/week: 0.0 standard drinks  . Drug use: No  . Sexual activity: Not on file  Other Topics Concern  . Not on file  Social History Narrative   ** Merged History Encounter **       Social Determinants of Health   Financial Resource Strain: Not on  file  Food Insecurity: Not on file  Transportation Needs: Not on file  Physical Activity: Not on file  Stress: Not on file  Social Connections: Not on file  Intimate Partner Violence: Not  on file    Family History:    Family History  Problem Relation Age of Onset  . Heart disease Mother   . Hypertension Mother   . Heart failure Mother   . Heart disease Father   . Hypertension Sister   . Heart attack Neg Hx   . Stroke Neg Hx   . Colon cancer Neg Hx   . Esophageal cancer Neg Hx      ROS:  Please see the history of present illness.  No fevers. No chest pain. All other ROS reviewed and negative.     Physical Exam/Data:   Vitals:   12/11/20 1715 12/11/20 1730 12/11/20 1745 12/11/20 1800  BP: (!) 104/55 (!) 96/55 (!) 90/45 (!) 105/52  Pulse: 69     Resp: (!) 22 16 (!) 23 18  Temp:      TempSrc:      SpO2: 100%     Weight:      Height:        Intake/Output Summary (Last 24 hours) at 12/11/2020 1823 Last data filed at 12/11/2020 1810 Gross per 24 hour  Intake 1232.52 ml  Output 220 ml  Net 1012.52 ml   Last 3 Weights 12/11/2020 11/27/2020 11/26/2020  Weight (lbs) 231 lb 14.8 oz 231 lb 14.8 oz 236 lb 15.9 oz  Weight (kg) 105.2 kg 105.2 kg 107.5 kg     Body mass index is 31.45 kg/m.  General: Well developed AAM in no acute distress. Head: Normocephalic, atraumatic, sclera non-icteric, no xanthomas, nares are without discharge. Neck: Negative for carotid bruits. JVP not elevated. Lungs: Clear bilaterally to auscultation without wheezes, rales, or rhonchi. Breathing is unlabored. Heart: Irregular, rate controlled, S1 S2 without murmurs, rubs, or gallops.  Abdomen: Soft, diffusely tender, non-distended with normoactive bowel sounds. No rebound/guarding. Extremities: No clubbing or cyanosis. No edema. Distal pedal pulses are 2+ and equal bilaterally. Neuro: Alert and oriented X 3. Moves all extremities spontaneously. Psych:  Responds to questions appropriately with a normal  affect.  EKG:  The EKG was personally reviewed and demonstrates:  Atrial flutter 70bpm with RBBB, borderline inferior ST depression, otherwise low voltage  Telemetry:  Telemetry was personally reviewed and demonstrates: atrial flutter rate controlled  Relevant CV Studies: 2D echo 09/26/20 1. Left ventricular ejection fraction, by estimation, is 50 to 55%. The  left ventricle has low normal function. The left ventricle has no regional  wall motion abnormalities. Left ventricular diastolic function could not  be evaluated.  2. Right ventricular systolic function is severely reduced. The right  ventricular size is severely enlarged. There is severely elevated  pulmonary artery systolic pressure. The estimated right ventricular  systolic pressure is 40.9 mmHg.  3. Right atrial size was severely dilated.  4. The mitral valve was not well visualized. Trivial mitral valve  regurgitation.  5. Tricuspid valve regurgitation is moderate to severe.  6. The aortic valve was not well visualized. Aortic valve regurgitation  is not visualized.  7. Severely dilated pulmonary artery.   Comparison(s): No significant change from prior study.   Conclusion(s)/Recommendation(s): Very difficult study, extremely limited  windows. LV function grossly normal. RV remains severely enlarged with  severely reduced function. Severe pulmonary hypertension.  Laboratory Data:  High Sensitivity Troponin:   Recent Labs  Lab 11/21/20 1957 11/22/20 0143 12/11/20 1703  TROPONINIHS 45* 50* 185*     Chemistry Recent Labs  Lab 12/11/20 0730  NA 135  K 2.5*  CL 86*  CO2 26  GLUCOSE  168*  BUN 138*  CREATININE 3.96*  CALCIUM 8.6*  GFRNONAA 15*  ANIONGAP 23*    Recent Labs  Lab 12/11/20 0730  PROT 7.0  ALBUMIN 2.7*  AST 39  ALT 22  ALKPHOS 124  BILITOT 1.1   Hematology Recent Labs  Lab 12/11/20 0730  WBC 16.4*  RBC 4.44  HGB 11.4*  HCT 37.0*  MCV 83.3  MCH 25.7*  MCHC 30.8  RDW  19.2*  PLT 422*   BNP Recent Labs  Lab 12/11/20 0730  BNP 1,858.3*    DDimer No results for input(s): DDIMER in the last 168 hours.   Radiology/Studies:  CT Abdomen Pelvis Wo Contrast  Result Date: 12/11/2020 CLINICAL DATA:  Abdominal distension. Abscess or infection suspected. Nausea and vomiting EXAM: CT ABDOMEN AND PELVIS WITHOUT CONTRAST TECHNIQUE: Multidetector CT imaging of the abdomen and pelvis was performed following the standard protocol without IV contrast. COMPARISON:  CT 10/05/2020 CT 01/03/2019 FINDINGS: Lower chest: Dense consolidation LEFT lung base. Interlobular septal thickening at the RIGHT lung base. 7 mm nodule in the RIGHT lower lobe on image 7/4. Interval improvement in RIGHT pleural effusion. LEFT basilar consolidation is worse. Hepatobiliary: No focal hepatic lesion on noncontrast exam. Gallbladder is distended to 5.7 cm. No radiodense gallstones. No gallbladder inflammation. Intrahepatic duct dilatation on noncontrast exam Pancreas: Pancreas is normal. No ductal dilatation. No pancreatic inflammation. Spleen: Multiple splenules in the LEFT upper quadrant. Adrenals/urinary tract: Of LEFT adrenal gland thickening to 19 mm has attenuation greater than benign adenoma. No nephrolithiasis or ureterolithiasis Stomach/Bowel: Stomach, small bowel, appendix, and cecum are normal. Ascending transverse colon normal. There is moderate volume stool in the descending colon. Large volume stool in the rectosigmoid colon. Stool ball in the rectum measuring 7 cm. Vascular/Lymphatic: Abdominal aorta is normal caliber with atherosclerotic calcification. There is no retroperitoneal or periportal lymphadenopathy. No pelvic lymphadenopathy. Reproductive: Prostate unremarkable Other: No intraperitoneal free air Musculoskeletal: LEFT hip prosthetic.  No acute osseous findings IMPRESSION: 1. Large volume stool in the rectosigmoid suggest constipation. 2. Dense consolidation in the LEFT lower lobe new  from comparison exam. 3. Improvement in RIGHT pleural effusion. 4. RIGHT lobe pulmonary nodule. Recommend follow-up CT chest in 3 months. 5. Distended gallbladder without evidence acute cholecystitis. 6. Nodular thickening of the LEFT adrenal gland similar to CT 01/03/2019 Electronically Signed   By: Suzy Bouchard M.D.   On: 12/11/2020 14:28   CT Head Wo Contrast  Result Date: 12/11/2020 CLINICAL DATA:  Altered mental status. EXAM: CT HEAD WITHOUT CONTRAST TECHNIQUE: Contiguous axial images were obtained from the base of the skull through the vertex without intravenous contrast. COMPARISON:  Head CT scan 04/09/2019. FINDINGS: Brain: No evidence of acute infarction, hemorrhage, hydrocephalus, extra-axial collection or mass lesion/mass effect. Chronic microvascular ischemic change and cavum septum pellucidum et vergae noted. Vascular: No hyperdense vessel.  Atherosclerosis is seen. Skull: Intact.  No focal lesion. Sinuses/Orbits: Negative. Other: None. IMPRESSION: No acute abnormality. Chronic microvascular ischemic change. Atherosclerosis. Electronically Signed   By: Inge Rise M.D.   On: 12/11/2020 14:00   DG Chest Port 1 View  Result Date: 12/11/2020 CLINICAL DATA:  Weakness EXAM: PORTABLE CHEST 1 VIEW COMPARISON:  11/21/2020 FINDINGS: Cardiac shadow is enlarged but stable. Aortic calcifications are again seen. Persistent left basilar airspace disease with associated effusion is noted. Some interval clearing in the right base is noted. Persistent central vascular congestion is seen. IMPRESSION: Persistent vascular congestion. Stable left basilar airspace opacity with a fusion although clearing in the right  base is noted. Electronically Signed   By: Inez Catalina M.D.   On: 12/11/2020 12:02     Assessment and Plan:   1. Severe sepsis with abdominal pain, nausea, vomiting, and acute encephalopathy and left lung PNA 2. AKI superimposed on CKD Stage IV 3. Severe hypokalemia 4. Chronic systolic  CHF with RV failure and severe pulmonary HTN 5. Nonobstructive CAD by cath 12/2018 with mild troponin elevation of 185 6. Chronic respiratory failure with COPD, OHS, OSA 7. Paroxysmal atrial fib, now with atrial flutter - rate controlled  Patient seen/examined with MD. Briefly this is a medically complex gentleman presenting with several week history of GI symptoms with findings above with large left lung pneumonia, acute worsening of kidney function, and severe hypokalemia. His acute encephalopathy appears to be already improving with just IV hydration and antibiotic therapy. Volume status looks stable without significant evidence of volume overload. He is out of rhythm in atrial flutter, not surprisingly given his prior arrhythmia history and severe metabolic demands. Fortunately he is relatively rate controlled and BP now improved from earlier. Would anticipate resuming oral amiodarone tomorrow if clinically stable. Eliquis was held due to mental status - given high CHADSVASC, we'll plan to start IV heparin per pharmacy. His EKG and troponin are mildly abnormal but suspect a demand process at this time without acute signs of ACS otherwise, so we do not suggest trending as it would not necessarily change management at present time. May benefit from advanced heart failure team following in the AM depending on how he does overnight.   Risk Assessment/Risk Scores:       New York Heart Association (NYHA) Functional Class difficult to assess given acute illness   CHA2DS2-VASc Score = 7  This indicates a 11.2% annual risk of stroke. The patient's score is based upon: CHF History: Yes HTN History: Yes Diabetes History: Yes Stroke History: Yes Vascular Disease History: Yes Age Score: 1 Gender Score: 0      For questions or updates, please contact [place] Please consult [email or web address] for contact info under    Signed, Charlie Pitter, PA-C  12/11/2020 6:23 PM  I have examined the  patient and reviewed assessment and plan and discussed with patient.  Agree with above as stated.   Despite his chronic systolic heart failure, it appears he was volume depleted over the past few days.  He does not show signs of volume overload on exam.  No leg edema.  He is breathing comfortably while lying flat.  Agree with gentle hydration, which has helped mental status already.  Watch for signs of volume overload.    IV heparin for now in the event that a procedure is needed.  Resume Eliquis once all procedure are completed, to reduce risk of stroke in the setting of atrial flutter, currently rate controlled. Larae Grooms

## 2020-12-11 NOTE — Progress Notes (Signed)
[facility] for Heparin Indication: atrial fibrillation  Allergies  Allergen Reactions  . Isosorb Dinitrate-Hydralazine Shortness Of Breath  . Benazepril Nausea And Vomiting  . Brimonidine Other (See Comments)    Dry eyes  . Metformin Other (See Comments)    Cramps and abdominal pain  . Morphine Rash    Patient Measurements: Height: 6' (182.9 cm) Weight: 105.2 kg (231 lb 14.8 oz) IBW/kg (Calculated) : 77.6 Heparin Dosing Weight: 99.5 kg  Vital Signs: Temp: 98.4 F (36.9 C) (02/08 1645) Temp Source: Oral (02/08 1645) BP: 103/54 (02/08 1830) Pulse Rate: 69 (02/08 1715)  Labs: Recent Labs    12/11/20 0730 12/11/20 1703  HGB 11.4*  --   HCT 37.0*  --   PLT 422*  --   CREATININE 3.96*  --   TROPONINIHS  --  185*    Estimated Creatinine Clearance: 21.1 mL/min (A) (by C-G formula based on SCr of 3.96 mg/dL (H)).   Medical History: Past Medical History:  Diagnosis Date  . Atrial flutter ([place])    a. recurrent AFlutter with RVR;  b. Amiodarone Rx started 4/16  . CAD (coronary artery disease)    a. LHC 1/16:  mLAD diffuse disease, pLCx mild disease, dLCx with disease but too small for PCI, RCA ok, EF 25-30%  . Chronic pain   . Chronic systolic CHF (congestive heart failure) ([place])   . COPD (chronic obstructive pulmonary disease) ([place])   . Diabetes mellitus without complication ([place])   . Ex-smoker 11/2017  . Hypercholesteremia   . Hypertension   . NICM (nonischemic cardiomyopathy) ([place])    a.dx 2016. b. 2D echo 06/2016 - Last echo 07/01/16: mod dilated LV, mod LVH, EF 25-30%, mild-mod MR, sev LAE, mild-mod reduced RV systolic function, mild-mod TR, PASP 7mHG.  .Marland KitchenPAF (paroxysmal atrial fibrillation) ([facility])    On amio - ot a candidate for flecainide due to cardiomyopathy, not a candidate for Tikosyn due to prolonged QT, and felt to be a poor candidate for ablation given left atrial size.  . Pulmonary hypertension ([place]   . Tobacco  abuse     Medications:  Scheduled:  . insulin aspart  0-15 Units Subcutaneous Q4H  . methylPREDNISolone (SOLU-MEDROL) injection  60 mg Intravenous BID  . polyethylene glycol  17 g Oral Daily  . sodium chloride flush  3 mL Intravenous Q12H  . vancomycin variable dose per unstable renal function (pharmacist dosing)   Does not apply See admin instructions    Assessment: Patient is a [id number] that is being admitted for altered mental status. The patient has a hx of afib with a chads-vasc score of ~ 7. Patient does take Apixaban pta for afib however this is being held due to mental status. Of note apparently nephrology decreased his home Apixaban dose to 2.5 mg BID in place of the 5 mg BID recommended dose based on  Age weight and renal function. Pharmacy has been asked to start heparin while holding apixaban.   Last dose of Apixaban  was at 2100 on 2/7  Goal of Therapy:  aPTT 66-102 seconds HL 0.3-0.7  Monitor platelets by anticoagulation protocol: Yes   Plan:  - No Heparin bolus as on apixaban PTA last dose was at 2100 on 2/7 - Start heparin drip @ 1650 units/hr   - Will monitor heparin using aPTT until aPTT and Heparin levels correlate - aPTT level in ~ 8 hours  - Monitor patient for s/s of bleeding and CBC  while on heparin   Duanne Limerick PharmD. BCPS  12/11/2020,6:58 PM

## 2020-12-11 NOTE — Progress Notes (Signed)
Notified bedside nurse of need to order lactic acid.

## 2020-12-11 NOTE — ED Triage Notes (Signed)
Pt arrives to ED via [facility] with c/o of abd pain and bloating for 2 weeks. Pt has nausea and vomiting with distended abdomen.

## 2020-12-11 NOTE — ED Provider Notes (Addendum)
[facility] EMERGENCY DEPARTMENT Provider Note   CSN: [id number] Arrival date & time: 12/11/20  7482     History Chief Complaint  Patient presents with  . Abdominal Pain    Nicholas Caldwell is a 75 y.o. male.  HPI With multiple medical problems including CHF, COPD, a flutter presents with new abdominal pain, fatigue. He notes the pain is diffuse, in different areas, without clear precipitating, alleviating, exacerbating factors.  Pain is sore, severe, tight. There is associated oliguria, but no dysuria.  No nausea, vomiting, diarrhea.  There is anorexia, and he notes substantial decreased oral intake over the past 2 weeks.  Since his most recent hospitalization last month he was transiently well before developing this abdominal pain. He seemingly is taking his medication as directed.    Past Medical History:  Diagnosis Date  . Atrial flutter ([place])    a. recurrent AFlutter with RVR;  b. Amiodarone Rx started 4/16  . CAD (coronary artery disease)    a. LHC 1/16:  mLAD diffuse disease, pLCx mild disease, dLCx with disease but too small for PCI, RCA ok, EF 25-30%  . Chronic pain   . Chronic systolic CHF (congestive heart failure) ([place])   . COPD (chronic obstructive pulmonary disease) ([place])   . Diabetes mellitus without complication ([place])   . Ex-smoker 11/2017  . Hypercholesteremia   . Hypertension   . NICM (nonischemic cardiomyopathy) ([place])    a.dx 2016. b. 2D echo 06/2016 - Last echo 07/01/16: mod dilated LV, mod LVH, EF 25-30%, mild-mod MR, sev LAE, mild-mod reduced RV systolic function, mild-mod TR, PASP 77mHG.  .Marland KitchenPAF (paroxysmal atrial fibrillation) (HCC)    On amio - ot a candidate for flecainide due to cardiomyopathy, not a candidate for Tikosyn due to prolonged QT, and felt to be a poor candidate for ablation given left atrial size.  . Pulmonary hypertension ([place]   . Tobacco abuse     Patient Active Problem List   Diagnosis Date Noted  . Stage III  pressure ulcer of sacral region (HHidalgo 10/01/2020  . Acute systolic CHF (congestive heart failure) (HMorley 09/25/2020  . Acute respiratory failure with hypercapnia (HClifford   . Encounter for intubation   . Pressure injury of skin 08/21/2019  . Cardiac arrest (H[place]angeville 08/20/2019  . Palliative care by specialist   . DNR (do not resuscitate) discussion   . Fatigue   . Acute CHF (congestive heart failure) (HSunset Acres 07/07/2019  . Acute on chronic heart failure (HEl Paraiso 06/09/2019  . Lobar pneumonia (HSuwanee 04/14/2019  . Hyperkalemia 04/10/2019  . Cerebral embolism with cerebral infarction 03/21/2019  . Gastritis and gastroduodenitis   . NSVT (nonsustained ventricular tachycardia) (H[place] 03/08/2019  . Tobacco abuse counseling 03/08/2019  . Iron deficiency anemia   . Acute on chronic systolic CHF (congestive heart failure) (HStapleton 03/06/2019  . Diabetes mellitus type 2 in obese (HBonnie   . Primary osteoarthritis of right hip   . Hypomagnesemia   . Steroid-induced hyperglycemia   . Supplemental oxygen dependent   . Leukocytosis   . Hypokalemia   . Acute on chronic anemia   . Diabetic peripheral neuropathy (HDolton   . Acute on chronic systolic (congestive) heart failure (HJoliet   . Acute on chronic respiratory failure (HHumboldt 01/14/2019  . Hypoxia   . Altered mental status   . RVF (right ventricular failure) (HBorger 12/30/2018  . AKI (acute kidney injury) (HSaugatuck   . Acute respiratory failure (HMason City 11/22/2018  . Acute on chronic respiratory  failure with hypoxia and hypercapnia ([place]) 11/13/2018  . Metabolic encephalopathy 61/44/3154  . Acute respiratory failure with hypoxia and hypercapnia ([place]) 07/26/2018  . Acute exacerbation of CHF (congestive heart failure) ([place]) 07/26/2018  . CHF exacerbation ([place]) 05/01/2018  . CHF (congestive heart failure) ([place]) 04/30/2018  . Chronic respiratory failure with hypoxia ([place]) 12/28/2017  . Acute encephalopathy   . Atrial fibrillation with RVR ([place])   . SVT (supraventricular  tachycardia) ([place])   . COPD GOLD 0   . Medically noncompliant   . Panlobular emphysema ([place])   . OSA (obstructive sleep apnea)   . Pulmonary hypertension ([place])   . Disorientation   . Pressure injury of skin 09/07/2016  . Acute on chronic respiratory failure with hypoxia ([place]) 09/05/2016  . Acute on chronic combined systolic and diastolic CHF (congestive heart failure) ([place]) 09/05/2016  . Skin lesion-left heal 09/05/2016  . Chronic systolic CHF (congestive heart failure) ([place]) 07/16/2016  . COPD (chronic obstructive pulmonary disease) ([place]) 07/16/2016  . Uncontrolled type 2 diabetes mellitus with complication ([place])   . Diabetic polyneuropathy associated with diabetes mellitus due to underlying condition ([place])   . Normocytic anemia 06/29/2016  . CAD - Non-obstructive by LHC1/16 01/21/2016  . Prolonged QT interval 10/09/2015  . Nonischemic cardiomyopathy ([place]) 10/09/2015  . PAF (paroxysmal atrial fibrillation) ([place])   . Chronic pain 02/19/2015  . Morbid obesity ([place]) 02/13/2015  . Dyspnea   . Elevated troponin I level 11/01/2014  . COPD exacerbation ([place]) 11/01/2014  . Essential hypertension 10/31/2014  . Type 2 diabetes mellitus with neuropathy 10/31/2014  . Hyperlipidemia  10/31/2014  . Cigarette smoker 10/31/2014    Past Surgical History:  Procedure Laterality Date  . BIOPSY  03/11/2019   Procedure: BIOPSY;  Surgeon: Lavena Bullion, DO;  Location: [facility];  Service: Gastroenterology;;  . BIOPSY  03/15/2019   Procedure: BIOPSY;  Surgeon: Irving Copas., MD;  Location: [facility];  Service: Gastroenterology;;  . CARDIOVERSION N/A 07/30/2018   Procedure: CARDIOVERSION;  Surgeon: Larey Dresser, MD;  Location: [facility] ENDOSCOPY;  Service: Cardiovascular;  Laterality: N/A;  . COLONOSCOPY N/A 03/11/2019   Procedure: COLONOSCOPY;  Surgeon: Lavena Bullion, DO;  Location: [facility];  Service: Gastroenterology;  Laterality: N/A;  . ENTEROSCOPY N/A 03/15/2019    Procedure: ENTEROSCOPY;  Surgeon: Rush Landmark Telford Nab., MD;  Location: [facility];  Service: Gastroenterology;  Laterality: N/A;  . ESOPHAGOGASTRODUODENOSCOPY Left 03/11/2019   Procedure: ESOPHAGOGASTRODUODENOSCOPY (EGD);  Surgeon: Lavena Bullion, DO;  Location: [facility] ENDOSCOPY;  Service: Gastroenterology;  Laterality: Left;  . LEFT AND RIGHT HEART CATHETERIZATION WITH CORONARY ANGIOGRAM N/A 11/06/2014   Procedure: LEFT AND RIGHT HEART CATHETERIZATION WITH CORONARY ANGIOGRAM;  Surgeon: Jettie Booze, MD;  Location: [facility] CATH LAB;  Service: Cardiovascular;  Laterality: N/A;  . RIGHT/LEFT HEART CATH AND CORONARY ANGIOGRAPHY N/A 12/06/2018   Procedure: RIGHT/LEFT HEART CATH AND CORONARY ANGIOGRAPHY;  Surgeon: Larey Dresser, MD;  Location: [facility] CV LAB;  Service: Cardiovascular;  Laterality: N/A;  . SPLENECTOMY    . SUBMUCOSAL TATTOO INJECTION  03/15/2019   Procedure: SUBMUCOSAL TATTOO INJECTION;  Surgeon: Rush Landmark Telford Nab., MD;  Location: [facility] ENDOSCOPY;  Service: Gastroenterology;;       Family History  Problem Relation Age of Onset  . Heart disease Mother   . Hypertension Mother   . Heart failure Mother   . Heart disease Father   . Hypertension Sister   . Heart attack Neg Hx   . Stroke Neg Hx   .  Colon cancer Neg Hx   . Esophageal cancer Neg Hx     Social History   Tobacco Use  . Smoking status: Former Smoker    Packs/day: 1.00    Years: 34.00    Pack years: 34.00    Types: Cigarettes    Quit date: 11/30/2018    Years since quitting: 2.0  . Smokeless tobacco: Never Used  Vaping Use  . Vaping Use: Never used  Substance Use Topics  . Alcohol use: No    Alcohol/week: 0.0 standard drinks  . Drug use: No    Home Medications Prior to Admission medications   Medication Sig Start Date End Date Taking? Authorizing Provider  Carboxymethylcellulose Sodium 0.25 % SOLN INSTILL 1 DROP IN BOTH EYES FOUR TIMES A DAY 03/09/20  Yes [provider]  hydrocortisone  (ANUSOL-HC) 2.5 % rectal cream 2 (two) times daily as needed. 07/03/20  Yes [provider]  ipratropium-albuterol (DUONEB) 0.5-2.5 (3) MG/3ML SOLN Take 3 mLs by nebulization 4 (four) times daily as needed.   Yes [provider]  acetaminophen (TYLENOL) 325 MG tablet Take 2 tablets (650 mg total) by mouth every 4 (four) hours as needed for headache or mild pain. 10/09/20   Geradine Girt, DO  albuterol (PROVENTIL) (2.5 MG/3ML) 0.083% nebulizer solution Take 3 mLs (2.5 mg total) by nebulization every 4 (four) hours as needed for wheezing or shortness of breath. 06/21/20   Tanda Rockers, MD  albuterol (VENTOLIN HFA) 108 (90 Base) MCG/ACT inhaler Inhale 2 puffs into the lungs every 6 (six) hours as needed for wheezing or shortness of breath.    [provider]  amiodarone (PACERONE) 200 MG tablet Take 0.5 tablets (100 mg total) by mouth daily. 08/28/20   Larey Dresser, MD  apixaban (ELIQUIS) 2.5 MG TABS tablet Take 1 tablet (2.5 mg total) by mouth 2 (two) times daily. 11/01/20 01/30/21  Kayleen Memos, DO  ascorbic acid (VITAMIN C) 500 MG tablet Take 500 mg by mouth 3 (three) times a week. Monday Wednesday Friday    [provider]  atorvastatin (LIPITOR) 40 MG tablet Take 1 tablet (40 mg total) by mouth daily. Patient taking differently: Take 40 mg by mouth at bedtime. 10/30/20   Larey Dresser, MD  Budeson-Glycopyrrol-Formoterol (BREZTRI AEROSPHERE) 160-9-4.8 MCG/ACT AERO Inhale 2 puffs into the lungs 2 (two) times daily. 06/21/20   Tanda Rockers, MD  carbamide peroxide (DEBROX) 6.5 % OTIC solution Place 5-10 drops into both ears daily as needed (ear wax).    [provider]  Carboxymethylcellulose Sod PF 0.25 % SOLN Place 1 drop into both eyes 4 (four) times daily.    [provider]  cetirizine (ZYRTEC) 10 MG tablet Take 10 mg by mouth daily as needed for allergies.     [provider]  doxycycline (VIBRA-TABS) 100 MG tablet Take 1  tablet (100 mg total) by mouth every 12 (twelve) hours. 11/27/20   Swayze, Ava, DO  famotidine (PEPCID) 20 MG tablet TAKE 1 TABLET(20 MG) BY MOUTH TWICE DAILY Patient taking differently: Take 20 mg by mouth 2 (two) times daily. 10/29/20   Larey Dresser, MD  feeding supplement, GLUCERNA SHAKE, (GLUCERNA SHAKE) LIQD Take 237 mLs by mouth 3 (three) times daily between meals. 11/27/20   Swayze, Ava, DO  fluticasone (FLONASE) 50 MCG/ACT nasal spray Place 2 sprays into both nostrils daily as needed for allergies.     [provider]  hydrocortisone (ANUSOL-HC) 2.5 % rectal cream  Place 1 application rectally 2 (two) times daily as needed for hemorrhoids or itching.     [provider]  hydrocortisone 1 % lotion Apply 1 application topically See admin instructions. Apply small amount to back twice daily for itchy rash as needed    [provider]  insulin aspart (NOVOLOG) 100 UNIT/ML injection Inject 0-15 Units into the skin 3 (three) times daily with meals. 10/09/20   Geradine Girt, DO  Insulin Syringe 27G X 1/2" 0.5 ML MISC 100 Syringes by Does not apply route 3 (three) times daily. 08/29/19   Hosie Poisson, MD  latanoprost (XALATAN) 0.005 % ophthalmic solution Place 1 drop into both eyes at bedtime.    [provider]  lidocaine (LIDODERM) 5 % Place 1 patch onto the skin daily. Remove & Discard patch within 12 hours or as directed by MD 10/10/20   Geradine Girt, DO  metolazone (ZAROXOLYN) 2.5 MG tablet Take 2.5 mg by mouth See admin instructions. 1 tablet on Monday and Wednesday as needed  for weight gain of 3 pounds in 24 hours    [provider]  oxyCODONE-acetaminophen (PERCOCET) 10-325 MG tablet Take 1 tablet by mouth every 6 (six) hours as needed for pain.    [provider]  OXYGEN Inhale 3 L/min into the lungs continuous.    [provider]  potassium chloride SA (KLOR-CON) 20 MEQ tablet Take 1 tablet (20 mEq total) by mouth 2 (two)  times daily. 11/09/20 12/09/20  Rafael Bihari, FNP  PRESCRIPTION MEDICATION CPAP    [provider]  silver sulfADIAZINE (SILVADENE) 1 % cream Apply topically daily. 11/01/20   Kayleen Memos, DO  simethicone (MYLICON) 80 MG chewable tablet Chew 1 tablet (80 mg total) by mouth every 6 (six) hours as needed for flatulence. 11/01/20   Kayleen Memos, DO  sodium chloride (OCEAN) 0.65 % SOLN nasal spray Place 2 sprays into both nostrils as needed for congestion.     [provider]  tamsulosin (FLOMAX) 0.4 MG CAPS capsule Take 1 capsule (0.4 mg total) by mouth daily. Patient taking differently: Take 0.4 mg by mouth at bedtime. 10/09/20   Geradine Girt, DO  torsemide (DEMADEX) 20 MG tablet Take 4 tablets (80 mg total) by mouth 2 (two) times daily. 11/18/19   Larey Dresser, MD  umeclidinium bromide (INCRUSE ELLIPTA) 62.5 MCG/INH AEPB Inhale 1 puff into the lungs daily. 11/28/20   Swayze, Ava, DO  zinc oxide 20 % ointment Apply 1 application topically 2 (two) times daily as needed for irritation (rash).     [provider]    Allergies    Isosorb dinitrate-hydralazine, Benazepril, Brimonidine, Metformin, and Morphine  Review of Systems   Review of Systems  Constitutional:       Per HPI, otherwise negative  HENT:       Per HPI, otherwise negative  Respiratory:       Per HPI, otherwise negative  Cardiovascular:       Per HPI, otherwise negative  Gastrointestinal: Positive for abdominal pain. Negative for diarrhea and vomiting.  Endocrine:       Negative aside from HPI  Genitourinary:       Neg aside from HPI   Musculoskeletal:       Per HPI, otherwise negative  Skin: Negative.   Neurological: Positive for weakness. Negative for syncope.    Physical Exam Updated Vital Signs BP (!) 98/54   Pulse 71   Temp 97.9 F (  36.6 C) (Oral)   Resp 14   Ht 6' (1.829 m)   Wt 105.2 kg   SpO2 100%   BMI 31.45 kg/m   Physical Exam Vitals and nursing note reviewed.   Constitutional:      General: He is not in acute distress.    Appearance: He is obese. He is ill-appearing. He is not toxic-appearing.  HENT:     Head: Normocephalic and atraumatic.  Eyes:     Extraocular Movements: EOM normal.     Conjunctiva/sclera: Conjunctivae normal.  Cardiovascular:     Rate and Rhythm: Normal rate and regular rhythm.  Pulmonary:     Effort: Pulmonary effort is normal. No respiratory distress.     Breath sounds: No stridor.  Abdominal:     General: There is no distension.     Palpations: Abdomen is soft.     Tenderness: There is generalized abdominal tenderness. There is guarding.  Musculoskeletal:        General: No edema.  Skin:    General: Skin is warm and dry.  Neurological:     Mental Status: He is alert and oriented to person, place, and time.     Motor: Atrophy present. No tremor.  Psychiatric:        Mood and Affect: Mood and affect normal.      ED Results / Procedures / Treatments   Labs (all labs ordered are listed, but only abnormal results are displayed) Labs Reviewed  LIPASE, BLOOD - Abnormal; Notable for the following components:      Result Value   Lipase 58 (*)    All other components within normal limits  COMPREHENSIVE METABOLIC PANEL - Abnormal; Notable for the following components:   Potassium 2.5 (*)    Chloride 86 (*)    Glucose, Bld 168 (*)    BUN 138 (*)    Creatinine, Ser 3.96 (*)    Calcium 8.6 (*)    Albumin 2.7 (*)    GFR, Estimated 15 (*)    Anion gap 23 (*)    All other components within normal limits  CBC - Abnormal; Notable for the following components:   WBC 16.4 (*)    Hemoglobin 11.4 (*)    HCT 37.0 (*)    MCH 25.7 (*)    RDW 19.2 (*)    Platelets 422 (*)    All other components within normal limits  URINALYSIS, ROUTINE W REFLEX MICROSCOPIC - Abnormal; Notable for the following components:   APPearance HAZY (*)    All other components within normal limits  BRAIN NATRIURETIC PEPTIDE - Abnormal;  Notable for the following components:   B Natriuretic Peptide 1,858.3 (*)    All other components within normal limits  CULTURE, BLOOD (SINGLE)  MRSA PCR SCREENING  POC SARS CORONAVIRUS 2 AG -  ED    EKG EKG Interpretation  Date/Time:  Tuesday December 11 2020 15:27:04 EST Ventricular Rate:  70 PR Interval:    QRS Duration: 136 QT Interval:  521 QTC Calculation: 563 R Axis:   118 Text Interpretation: Sinus rhythm Right bundle branch block ST-t wave abnormality Abnormal ECG Confirmed by Carmin Muskrat [phone]) on 12/11/2020 4:07:37 PM   Radiology CT Abdomen Pelvis Wo Contrast  Result Date: 12/11/2020 CLINICAL DATA:  Abdominal distension. Abscess or infection suspected. Nausea and vomiting EXAM: CT ABDOMEN AND PELVIS WITHOUT CONTRAST TECHNIQUE: Multidetector CT imaging of the abdomen and pelvis was performed following the standard protocol without IV contrast. COMPARISON:  CT 10/05/2020  CT 01/03/2019 FINDINGS: Lower chest: Dense consolidation LEFT lung base. Interlobular septal thickening at the RIGHT lung base. 7 mm nodule in the RIGHT lower lobe on image 7/4. Interval improvement in RIGHT pleural effusion. LEFT basilar consolidation is worse. Hepatobiliary: No focal hepatic lesion on noncontrast exam. Gallbladder is distended to 5.7 cm. No radiodense gallstones. No gallbladder inflammation. Intrahepatic duct dilatation on noncontrast exam Pancreas: Pancreas is normal. No ductal dilatation. No pancreatic inflammation. Spleen: Multiple splenules in the LEFT upper quadrant. Adrenals/urinary tract: Of LEFT adrenal gland thickening to 19 mm has attenuation greater than benign adenoma. No nephrolithiasis or ureterolithiasis Stomach/Bowel: Stomach, small bowel, appendix, and cecum are normal. Ascending transverse colon normal. There is moderate volume stool in the descending colon. Large volume stool in the rectosigmoid colon. Stool ball in the rectum measuring 7 cm. Vascular/Lymphatic: Abdominal aorta  is normal caliber with atherosclerotic calcification. There is no retroperitoneal or periportal lymphadenopathy. No pelvic lymphadenopathy. Reproductive: Prostate unremarkable Other: No intraperitoneal free air Musculoskeletal: LEFT hip prosthetic.  No acute osseous findings IMPRESSION: 1. Large volume stool in the rectosigmoid suggest constipation. 2. Dense consolidation in the LEFT lower lobe new from comparison exam. 3. Improvement in RIGHT pleural effusion. 4. RIGHT lobe pulmonary nodule. Recommend follow-up CT chest in 3 months. 5. Distended gallbladder without evidence acute cholecystitis. 6. Nodular thickening of the LEFT adrenal gland similar to CT 01/03/2019 Electronically Signed   By: Suzy Bouchard M.D.   On: 12/11/2020 14:28   CT Head Wo Contrast  Result Date: 12/11/2020 CLINICAL DATA:  Altered mental status. EXAM: CT HEAD WITHOUT CONTRAST TECHNIQUE: Contiguous axial images were obtained from the base of the skull through the vertex without intravenous contrast. COMPARISON:  Head CT scan 04/09/2019. FINDINGS: Brain: No evidence of acute infarction, hemorrhage, hydrocephalus, extra-axial collection or mass lesion/mass effect. Chronic microvascular ischemic change and cavum septum pellucidum et vergae noted. Vascular: No hyperdense vessel.  Atherosclerosis is seen. Skull: Intact.  No focal lesion. Sinuses/Orbits: Negative. Other: None. IMPRESSION: No acute abnormality. Chronic microvascular ischemic change. Atherosclerosis. Electronically Signed   By: Inge Rise M.D.   On: 12/11/2020 14:00   DG Chest Port 1 View  Result Date: 12/11/2020 CLINICAL DATA:  Weakness EXAM: PORTABLE CHEST 1 VIEW COMPARISON:  11/21/2020 FINDINGS: Cardiac shadow is enlarged but stable. Aortic calcifications are again seen. Persistent left basilar airspace disease with associated effusion is noted. Some interval clearing in the right base is noted. Persistent central vascular congestion is seen. IMPRESSION: Persistent  vascular congestion. Stable left basilar airspace opacity with a fusion although clearing in the right base is noted. Electronically Signed   By: Inez Catalina M.D.   On: 12/11/2020 12:02    Procedures Procedures   Medications Ordered in ED Medications  lactated ringers infusion (has no administration in time range)  vancomycin (VANCOCIN) 2,500 mg in sodium chloride 0.9 % 500 mL IVPB (has no administration in time range)  ceFEPIme (MAXIPIME) 2 g in sodium chloride 0.9 % 100 mL IVPB (has no administration in time range)  ceFEPIme (MAXIPIME) 2 g in sodium chloride 0.9 % 100 mL IVPB (has no administration in time range)  vancomycin variable dose per unstable renal function (pharmacist dosing) (has no administration in time range)  0.9 % NaCl with KCl 20 mEq/ L  infusion ( Intravenous Infusion Verify 12/11/20 1332)    ED Course  I have reviewed the triage vital signs and the nursing notes.  Pertinent labs & imaging results that were available during my care of  the patient were reviewed by me and considered in my medical decision making (see chart for details).  Male with multiple medical issues, CKD, CHF, COPD, presents with new abdominal pain, weakness. Here patient is initially mildly hypotensive, and with consideration of infection, CT scan, labs ordered.   Initial findings notable for substantial hypo kalemia, substantial worsening of his renal function. Patient has begun resuscitation with fluids/potassium, IV.  Update: I was called to bedside as the patient was slightly less interactive than he had previously been. Patient has seemingly recovered, and is now answering questions appropriately, though he notes that he is tired. Head CT has been ordered to complement his abdominal pelvis CT scan.   3:10 PM Discussed all findings with the patient, reviewed CT, x-ray, labs. Findings consistent with worsening renal function in the context of ongoing heart failure challenges, possibly  complicated by left lower lobe pneumonia. Patient meets sepsis criteria, and has received broad-spectrum antibiotics. Some suspicion for the patient's prior physiologic condition contributing to his hypotension, and now after initial fluid resuscitation, the patient will switch to potassium milliequivalent boluses rather than continuous fluids. Lung sounds remain clear, however, patient does require 1 additional liter per nasal cannula to achieve appropriate saturation.  Given consideration of sepsis versus worsening renal function/CHF, the patient will require admission. I discussed this case with our internal medicine colleagues as well. Final Clinical Impression(s) / ED Diagnoses Final diagnoses:  HCAP (healthcare-associated pneumonia)  AKI (acute kidney injury) ([place])  Hypokalemia  Acute congestive heart failure, unspecified heart failure type ([place])   MDM Number of Diagnoses or Management Options Acute congestive heart failure, unspecified heart failure type [facility]): new, needed workup AKI (acute kidney injury) ([place]): new, needed workup HCAP (healthcare-associated pneumonia): new, needed workup Hypokalemia: new, needed workup   Amount and/or Complexity of Data Reviewed Clinical lab tests: reviewed Tests in the radiology section of CPT: reviewed Tests in the medicine section of CPT: reviewed Decide to obtain previous medical records or to obtain history from someone other than the patient: yes Review and summarize past medical records: yes Discuss the patient with other providers: yes Independent visualization of images, tracings, or specimens: yes  Critical Care Total time providing critical care: 30-74 minutes (40)  Patient Progress Patient progress: improved    Carmin Muskrat, MD 12/11/20 1524    Carmin Muskrat, MD 12/11/20 [phone]

## 2020-12-11 NOTE — Assessment & Plan Note (Addendum)
-  Hospitalization in December for similar requiring diuresis - prior notes reviewed: chronic systolic failure transitioned to [facility] with prominent RV failure -BNP (1858) likely has some confounding from underlying worsening renal failure however CXR does show some volume overload but no obvious peripheral edema seen on exam - for now will use IVF given severe sepsis with close monitoring of fluid and respiratory status - monitor I&O - EKG shows flutter waves and ST depression; discussed with cardiology; given his volume status being difficult to manage with possible need for fluids and/or diuresis along with hypotension he may be in possible need of inotropic support depending on how he does over the next 12-24 hours after IVF started.He does have a history of cardiogenic shock as well requiring milrinone  - have asked for cardiology input and to assist with management

## 2020-12-11 NOTE — Assessment & Plan Note (Signed)
-  Patient certainly tender to light palpation throughout abdomen.  He has a very large stool burden appreciated on CT abdomen/pelvis -Gallbladder is also distended but no signs of acute cholecystitis; this may be congestion from CHF -Follow-up renal ultrasound as well -Needs aggressive laxative regimen.  Avoiding Fleet enema due to renal failure, soapsuds enema ordered and MiraLAX.  Further regimen to be initiated pending response

## 2020-12-11 NOTE — Assessment & Plan Note (Addendum)
-  Tachypnea, leukocytosis, decreased mentation/encephalopathy; source considered left-sided pneumonia -Given vancomycin and cefepime in the ER.  Adding on Flagyl given increased risk for possible aspiration in setting of his lethargy/encephalopathy.  May also be some possible abdominal pathology given distended colon/large stool burden/distended gallbladder -Follow-up lactic which is pending -CURB 65 = 4 points.  Place in PCU.  Also starting steroids; BP also low/normal -Depending on clinical response, may still need CT chest if worsening

## 2020-12-11 NOTE — Assessment & Plan Note (Signed)
-  patient symptoms include lethargy/decreased mentation - etiology considered due to metabolic and septic due to uremia and PNA respectively

## 2020-12-11 NOTE — Assessment & Plan Note (Signed)
-  no s/s exacerbation at this time - continue chronic O2, 3-4L at home - continue abx and steroids in setting of sepsis

## 2020-12-11 NOTE — Assessment & Plan Note (Signed)
-  SSI and CBG monitoring - q4h for now until alert then can change to achs

## 2020-12-11 NOTE — Assessment & Plan Note (Signed)
-  s/p NS w/ 20 KCL in ER as well as KCL runs x 4 - check Mg - repeat BMP after above supplementation and replete further if needed

## 2020-12-11 NOTE — ED Notes (Signed)
Called lab to have BNP added on

## 2020-12-11 NOTE — Progress Notes (Signed)
Pharmacy Antibiotic Note  Nicholas Caldwell is a 75 y.o. male admitted on 12/11/2020 with sepsis.  Pharmacy has been consulted for Vancomycin and Cefepime dosing. WBC elevated 16.4, afebrile. History of CKD stage 4 - Scr elevated 3.96 (baseline ~1.7), CrCl 21 ml/min.  Plan: Cefepime 2 gm IV q24 hrs Vancomycin 2500 mg IV once Given poor renal function, will hold off on ordering maintenance vancomycin doses for now Monitor renal function, cultures/sensitivities, clinical progression Vancomycin levels as indicated   Height: 6' (182.9 cm) Weight: 105.2 kg (231 lb 14.8 oz) IBW/kg (Calculated) : 77.6  Temp (24hrs), Avg:98.4 F (36.9 C), Min:97.9 F (36.6 C), Max:98.8 F (37.1 C)  Recent Labs  Lab 12/11/20 0730  WBC 16.4*  CREATININE 3.96*    Estimated Creatinine Clearance: 21.1 mL/min (A) (by C-G formula based on SCr of 3.96 mg/dL (H)).    Allergies  Allergen Reactions  . Isosorb Dinitrate-Hydralazine Shortness Of Breath  . Benazepril Nausea And Vomiting  . Brimonidine Other (See Comments)    Dry eyes  . Metformin Other (See Comments)    Cramps and abdominal pain  . Morphine Rash    Antimicrobials this admission: Vancomycin 2/8 >>  Cefepime 2/8 >>   Dose adjustments this admission: N/A  Microbiology results: 2/8 BCx: pending 2/8 MRSA PCR: pending  Richardine Service, PharmD, BCPS PGY2 Cardiology Pharmacy Resident Phone: [phone] 12/11/2020  3:07 PM  Please check [email or web address] for unit-specific pharmacy phone numbers.

## 2020-12-11 NOTE — Progress Notes (Signed)
Notified bedside nurse of need to draw lactic acid and blood cultures and give antibiotics.

## 2020-12-11 NOTE — Assessment & Plan Note (Signed)
-  patient has history of CKD4. Baseline creat ~ 2.3, eGFR 25-29 - patient presents with increase in creat >0.3 mg/dL above baseline, creat increase >1.5x baseline presumed to have occurred within past 7 days PTA - creat 3.96 and BUN 138 on admission -Discussed on the phone with nephrology while patient was in the ER.  Tentative plan at this time is to give patient a fluid challenge overnight and if worsening renal function tomorrow then to formally consult -Renal ultrasound ordered -Severe constipation/stool burden may also be contributing to possible urinary retention -Insert Foley for definitive UOP measurement and to rule out obstruction

## 2020-12-11 NOTE — ED Notes (Signed)
Notified [place] MD of pts BP and that pt is now having delayed responses to questions. CT head being ordered per Vanita Panda MD

## 2020-12-11 NOTE — Assessment & Plan Note (Signed)
-  Hold Eliquis for now in setting of severe encephalopathy and does not appear safe for orals as well as renal function -Use heparin subq - resume Eliquis when able

## 2020-12-11 NOTE — Assessment & Plan Note (Signed)
-  hold home BP meds for now in setting of sepsis and hypotension

## 2020-12-11 NOTE — Assessment & Plan Note (Deleted)
-  baseline O2 3-4L at home; currently at baseline but has potential for worsening given large left-sided pneumonia

## 2020-12-11 NOTE — ED Notes (Signed)
Oral contrast given to pt to drink

## 2020-12-11 NOTE — Hospital Course (Addendum)
Nicholas Caldwell is a 75 yo male with PMH chronic dCHF, CKD4, afib (on Eliquis), COPD (on chronic 3-4 L O2), OHS/OSA, HTN, HLD, pulmonary HTN, DMII who presented to the ER with abdominal distention/bloating and pain that had been going on for approximately 2 weeks.  He also had associated nausea/vomiting.  He was very lethargic in the ER but able to provide some information.  He endorsed no chest pain or worsening shortness of breath despite imaging findings and only endorsed a mild nonproductive cough. He states he is on diuretics at home but states he was not taking them recently; he has also had poor appetite. Unable to obtain any further review of systems due to significant lethargy and patient requiring almost a sternal rub to stay awake.  Of note, he was recently hospitalized December 2021 for CHF exacerbation and improved after aggressive diuresis.  CXR obtained in the ER showed persistent vascular congestion and large left-sided airspace opacity.  This was concerning for a large left-sided pneumonia.  CT head negative for acute abnormalities. CT abdomen/pelvis showed large stool burden notably in the rectosigmoid region consistent with severe constipation.  Also lung bases were seen which showed dense consolidation in the left lower lobe.  Small right pleural effusion noted.  Right lobe pulmonary nodule also seen with recommended CT chest in 3 months.  Also showed distended gallbladder without evidence of acute cholecystitis.  Notable labs included: Na 135, K 2.5, CL 86, CO2 26, Glucose 168, BUN 138, Creat 3.96 WBC 16.4, Hgb 11.4, Hct 37, PLTC 422 Covid negative  Lipase 58 BNP 1858 UA: Nitrite negative, LE negative Lactic acid pending  He received vanc and cefepime in the ER.  Blood cultures were ordered.

## 2020-12-11 NOTE — Assessment & Plan Note (Signed)
-  baseline O2 3-4L at home; currently at baseline but has potential for worsening given large left-sided pneumonia

## 2020-12-11 NOTE — ED Notes (Signed)
Trop 185; notified [place] MD (cardiology) who is currently at bedside

## 2020-12-11 NOTE — Progress Notes (Signed)
Notified provider of need to administer fluid bolus, pt needs 3156 cc fluid, bedside Rn also aware, pt has CHF.

## 2020-12-12 DIAGNOSIS — J449 Chronic obstructive pulmonary disease, unspecified: Secondary | ICD-10-CM

## 2020-12-12 DIAGNOSIS — J9611 Chronic respiratory failure with hypoxia: Secondary | ICD-10-CM

## 2020-12-12 DIAGNOSIS — N179 Acute kidney failure, unspecified: Secondary | ICD-10-CM | POA: Diagnosis not present

## 2020-12-12 DIAGNOSIS — J189 Pneumonia, unspecified organism: Secondary | ICD-10-CM

## 2020-12-12 DIAGNOSIS — A419 Sepsis, unspecified organism: Secondary | ICD-10-CM | POA: Diagnosis not present

## 2020-12-12 DIAGNOSIS — N183 Chronic kidney disease, stage 3 unspecified: Secondary | ICD-10-CM

## 2020-12-12 DIAGNOSIS — E876 Hypokalemia: Secondary | ICD-10-CM

## 2020-12-12 DIAGNOSIS — I5023 Acute on chronic systolic (congestive) heart failure: Secondary | ICD-10-CM

## 2020-12-12 DIAGNOSIS — G9341 Metabolic encephalopathy: Secondary | ICD-10-CM | POA: Diagnosis not present

## 2020-12-12 LAB — BASIC METABOLIC PANEL
Anion gap: 17 — ABNORMAL HIGH (ref 5–15)
Anion gap: 16 — ABNORMAL HIGH (ref 5–15)
BUN: 128 mg/dL — ABNORMAL HIGH (ref 8–23)
BUN: 117 mg/dL — ABNORMAL HIGH (ref 8–23)
CO2: 25 mmol/L (ref 22–32)
CO2: 26 mmol/L (ref 22–32)
Calcium: 8 mg/dL — ABNORMAL LOW (ref 8.9–10.3)
Calcium: 8.2 mg/dL — ABNORMAL LOW (ref 8.9–10.3)
Chloride: 94 mmol/L — ABNORMAL LOW (ref 98–111)
Chloride: 95 mmol/L — ABNORMAL LOW (ref 98–111)
Creatinine, Ser: 3.2 mg/dL — ABNORMAL HIGH (ref 0.61–1.24)
Creatinine, Ser: 2.97 mg/dL — ABNORMAL HIGH (ref 0.61–1.24)
GFR, Estimated: 20 mL/min — ABNORMAL LOW (ref >60)
GFR, Estimated: 22 mL/min — ABNORMAL LOW (ref >60)
Glucose, Bld: 131 mg/dL — ABNORMAL HIGH (ref 70–99)
Glucose, Bld: 187 mg/dL — ABNORMAL HIGH (ref 70–99)
Potassium: 2.7 mmol/L — CL (ref 3.5–5.1)
Potassium: 3.1 mmol/L — ABNORMAL LOW (ref 3.5–5.1)
Sodium: 136 mmol/L (ref 135–145)
Sodium: 137 mmol/L (ref 135–145)

## 2020-12-12 LAB — APTT: aPTT: 160 seconds — ABNORMAL HIGH (ref 24–36)

## 2020-12-12 LAB — CBC WITH DIFFERENTIAL/PLATELET
Abs Immature Granulocytes: 0 10*3/uL (ref 0.00–0.07)
Basophils Absolute: 0 10*3/uL (ref 0.0–0.1)
Basophils Relative: 0 %
Eosinophils Absolute: 0 10*3/uL (ref 0.0–0.5)
Eosinophils Relative: 0 %
HCT: 31.6 % — ABNORMAL LOW (ref 39.0–52.0)
Hemoglobin: 10 g/dL — ABNORMAL LOW (ref 13.0–17.0)
Lymphocytes Relative: 0 %
Lymphs Abs: 0 10*3/uL — ABNORMAL LOW (ref 0.7–4.0)
MCH: 26.6 pg (ref 26.0–34.0)
MCHC: 31.6 g/dL (ref 30.0–36.0)
MCV: 84 fL (ref 80.0–100.0)
Monocytes Absolute: 0.3 10*3/uL (ref 0.1–1.0)
Monocytes Relative: 2 %
Neutro Abs: 12.7 10*3/uL — ABNORMAL HIGH (ref 1.7–7.7)
Neutrophils Relative %: 98 %
Platelets: 401 10*3/uL — ABNORMAL HIGH (ref 150–400)
RBC: 3.76 MIL/uL — ABNORMAL LOW (ref 4.22–5.81)
RDW: 19.2 % — ABNORMAL HIGH (ref 11.5–15.5)
WBC: 13 10*3/uL — ABNORMAL HIGH (ref 4.0–10.5)
nRBC: 0 % (ref 0.0–0.2)
nRBC: 0 /100 WBC

## 2020-12-12 LAB — CBG MONITORING, ED
Glucose-Capillary: 107 mg/dL — ABNORMAL HIGH (ref 70–99)
Glucose-Capillary: 113 mg/dL — ABNORMAL HIGH (ref 70–99)
Glucose-Capillary: 135 mg/dL — ABNORMAL HIGH (ref 70–99)
Glucose-Capillary: 144 mg/dL — ABNORMAL HIGH (ref 70–99)
Glucose-Capillary: 162 mg/dL — ABNORMAL HIGH (ref 70–99)
Glucose-Capillary: 173 mg/dL — ABNORMAL HIGH (ref 70–99)

## 2020-12-12 LAB — PROTIME-INR
INR: 1.6 — ABNORMAL HIGH (ref 0.8–1.2)
Prothrombin Time: 18.6 seconds — ABNORMAL HIGH (ref 11.4–15.2)

## 2020-12-12 LAB — HEPARIN LEVEL (UNFRACTIONATED): Heparin Unfractionated: >2.2 IU/mL — ABNORMAL HIGH (ref 0.30–0.70)

## 2020-12-12 LAB — MAGNESIUM: Magnesium: 2.6 mg/dL — ABNORMAL HIGH (ref 1.7–2.4)

## 2020-12-12 LAB — PROCALCITONIN: Procalcitonin: 3.22 ng/mL

## 2020-12-12 LAB — CORTISOL-AM, BLOOD: Cortisol - AM: 29.4 ug/dL — ABNORMAL HIGH (ref 6.7–22.6)

## 2020-12-12 MED ORDER — UMECLIDINIUM BROMIDE 62.5 MCG/INH IN AEPB
1.0000 | INHALATION_SPRAY | Freq: Every day | RESPIRATORY_TRACT | Status: DC
  Administered 2020-12-13 – 2020-12-19 (×7): 1 via RESPIRATORY_TRACT
  Filled 2020-12-12: qty 7

## 2020-12-12 MED ORDER — OXYCODONE-ACETAMINOPHEN 5-325 MG PO TABS
1.0000 | ORAL_TABLET | Freq: Four times a day (QID) | ORAL | Status: DC | PRN
Start: 2020-12-12 — End: 2020-12-19
  Administered 2020-12-12 – 2020-12-19 (×23): 1 via ORAL
  Filled 2020-12-12 (×24): qty 1

## 2020-12-12 MED ORDER — LIDOCAINE 5 % EX PTCH
1.0000 | MEDICATED_PATCH | CUTANEOUS | Status: DC
  Administered 2020-12-14 – 2020-12-19 (×5): 1 via TRANSDERMAL
  Filled 2020-12-12 (×6): qty 1

## 2020-12-12 MED ORDER — POTASSIUM CHLORIDE 10 MEQ/100ML IV SOLN
10.0000 meq | INTRAVENOUS | Status: AC
  Administered 2020-12-12 (×4): 10 meq via INTRAVENOUS
  Filled 2020-12-12 (×4): qty 100

## 2020-12-12 MED ORDER — POTASSIUM CHLORIDE CRYS ER 20 MEQ PO TBCR
40.0000 meq | EXTENDED_RELEASE_TABLET | Freq: Once | ORAL | Status: DC
  Filled 2020-12-12: qty 2

## 2020-12-12 MED ORDER — AMIODARONE HCL 200 MG PO TABS
100.0000 mg | ORAL_TABLET | Freq: Every day | ORAL | Status: DC
  Administered 2020-12-12: 100 mg via ORAL
  Filled 2020-12-12: qty 1

## 2020-12-12 MED ORDER — ALBUTEROL SULFATE (2.5 MG/3ML) 0.083% IN NEBU
2.5000 mg | INHALATION_SOLUTION | RESPIRATORY_TRACT | Status: DC | PRN
  Administered 2020-12-13: 2.5 mg via RESPIRATORY_TRACT
  Filled 2020-12-12: qty 3

## 2020-12-12 MED ORDER — AMIODARONE HCL 200 MG PO TABS: 200.0000 mg | ORAL_TABLET | Freq: Every day | ORAL | Status: DC

## 2020-12-12 MED ORDER — VANCOMYCIN HCL 1250 MG/250ML IV SOLN: 1250.0000 mg | INTRAVENOUS | Status: DC

## 2020-12-12 MED ORDER — OXYCODONE HCL 5 MG PO TABS
5.0000 mg | ORAL_TABLET | Freq: Four times a day (QID) | ORAL | Status: DC | PRN
  Administered 2020-12-12 – 2020-12-18 (×22): 5 mg via ORAL
  Filled 2020-12-12 (×23): qty 1

## 2020-12-12 MED ORDER — ATORVASTATIN CALCIUM 40 MG PO TABS
40.0000 mg | ORAL_TABLET | Freq: Every day | ORAL | Status: DC
  Administered 2020-12-12 – 2020-12-19 (×8): 40 mg via ORAL
  Filled 2020-12-12 (×8): qty 1

## 2020-12-12 MED ORDER — SORBITOL 70 % SOLN
960.0000 mL | TOPICAL_OIL | Freq: Once | ORAL | Status: DC
  Filled 2020-12-12 (×2): qty 473

## 2020-12-12 MED ORDER — APIXABAN 2.5 MG PO TABS
2.5000 mg | ORAL_TABLET | Freq: Two times a day (BID) | ORAL | Status: DC
  Filled 2020-12-12 (×2): qty 1

## 2020-12-12 MED ORDER — TAMSULOSIN HCL 0.4 MG PO CAPS
0.4000 mg | ORAL_CAPSULE | Freq: Every day | ORAL | Status: DC
Start: 2020-12-12 — End: 2020-12-19
  Administered 2020-12-12 – 2020-12-19 (×8): 0.4 mg via ORAL
  Filled 2020-12-12 (×8): qty 1

## 2020-12-12 MED ORDER — POTASSIUM CHLORIDE CRYS ER 20 MEQ PO TBCR
40.0000 meq | EXTENDED_RELEASE_TABLET | Freq: Three times a day (TID) | ORAL | Status: AC
  Administered 2020-12-12 (×2): 40 meq via ORAL
  Filled 2020-12-12 (×2): qty 2

## 2020-12-12 MED ORDER — AMIODARONE HCL 200 MG PO TABS
200.0000 mg | ORAL_TABLET | Freq: Two times a day (BID) | ORAL | Status: DC
  Administered 2020-12-12 – 2020-12-19 (×14): 200 mg via ORAL
  Filled 2020-12-12 (×14): qty 1

## 2020-12-12 MED ORDER — OXYCODONE-ACETAMINOPHEN 10-325 MG PO TABS: 1.0000 | ORAL_TABLET | Freq: Four times a day (QID) | ORAL | Status: DC | PRN

## 2020-12-12 MED ORDER — HEPARIN (PORCINE) 25000 UT/250ML-% IV SOLN
1350.0000 [IU]/h | INTRAVENOUS | Status: DC
  Administered 2020-12-12: 1350 [IU]/h via INTRAVENOUS
  Filled 2020-12-12: qty 250

## 2020-12-12 MED ORDER — BISACODYL 10 MG RE SUPP
10.0000 mg | Freq: Once | RECTAL | Status: AC
  Administered 2020-12-12: 10 mg via RECTAL
  Filled 2020-12-12: qty 1

## 2020-12-12 MED ORDER — APIXABAN 5 MG PO TABS
5.0000 mg | ORAL_TABLET | Freq: Two times a day (BID) | ORAL | Status: DC
  Administered 2020-12-12 – 2020-12-19 (×15): 5 mg via ORAL
  Filled 2020-12-12 (×14): qty 1

## 2020-12-12 NOTE — ED Notes (Signed)
Per pharmacy turn off heparing x1hr

## 2020-12-12 NOTE — ED Notes (Signed)
Dinner Tray Ordered @ 1725.

## 2020-12-12 NOTE — ED Notes (Signed)
Dr Wynelle Cleveland secured message for clarification on PO K+ as pt was given 40 meq IV and 40 meq po.3rd dose was ordered

## 2020-12-12 NOTE — Progress Notes (Addendum)
Pharmacy Antibiotic Note  Nicholas Caldwell is a 75 y.o. male admitted on 12/11/2020 with sepsis/PNA.  Pharmacy has been consulted for Vancomycin and Cefepime dosing. Also on Flagyl per MD. History of CKD stage 4 - Scr elevated 3.96 on presentation (baseline ~1.7), improved to 3.2 today.  Patient was loaded with vancomycin 2/8.  Plan: Cefepime 2 gm IV q24h Flagyl 540m IV q8h per MD Start vancomycin 12512mIV q48h. Goal AUC 400-550, expected AUC 508, SCr used 3.2) Monitor renal function, cultures/sensitivities, clinical progression Vancomycin levels as indicated Reordered MRSA PCR    Height: 6' (182.9 cm) Weight: 105.2 kg (231 lb 14.8 oz) IBW/kg (Calculated) : 77.6  Temp (24hrs), Avg:98.2 F (36.8 C), Min:97.9 F (36.6 C), Max:98.4 F (36.9 C)  Recent Labs  Lab 12/11/20 0730 12/11/20 1650 12/11/20 2045 12/12/20 0419  WBC 16.4*  --   --  13.0*  CREATININE 3.96*  --  3.56* 3.20*  LATICACIDVEN  --  1.0  --   --     Estimated Creatinine Clearance: 26.1 mL/min (A) (by C-G formula based on SCr of 3.2 mg/dL (H)).    Allergies  Allergen Reactions  . Isosorb Dinitrate-Hydralazine Shortness Of Breath  . Benazepril Nausea And Vomiting  . Brimonidine Other (See Comments)    Dry eyes  . Metformin Other (See Comments)    Cramps and abdominal pain  . Morphine Rash    Antimicrobials this admission: Vancomycin 2/8 >>  Cefepime 2/8 >>  Flagyl 2/8 >>  Microbiology results: 2/8 BCx: pending 2/8 MRSA PCR: pending   HaArturo MortonPharmD, BCPS Please check AMION for all [place] numbers Clinical Pharmacist 12/12/2020 9:01 AM

## 2020-12-12 NOTE — ED Notes (Signed)
Lunch Tray Ordered @ 1046.

## 2020-12-12 NOTE — Consult Note (Signed)
Advanced Heart Failure Team Consult Note   Primary Physician: [name], MD PCP-Cardiologist:  No primary care provider on file.  Reason for Consultation: Atrial flutter, h/o CHF  HPI:    Nicholas Caldwell is seen today for evaluation of atrial flutter, h/o CHF at the request of Dayna Dunn.   Patientis a 75 y.o.malewith a history of chronic primarily diastolic CHF with severe RV dysfunction, COPD on 3 L O2, DM, HTN, OHS/OSA on CPAP, paroxysmal atrial flutter/fibrillation s/p DCCV, hyperlipidemia, CAD, and noncompliance.He has multiple admissions due to HF and respiratory failure. Most recent echo in 11/21 showed EF 50-55% with severely enlarged and severely dysfunctional RV, PASP 68 mmHg, moderate-severe TR.   Patient was admitted yesterday with abdominal pain/nausea/poor po intake for about 2 wks. He was lethargic in the ER initially.  CXR showed extensive left-sided PNA, and creatinine was up to 3.56 from baseline around 1.8. CT abd/pelvis showed copious colonic stool.  He was also noted to be in rate-controlled atrial flutter (had been in NSR in the recent past).  He was markedly hypokalemic. Eliquis was held with encephalopathy and he was started on heparin gtt.  He was started on vancomycin/cefepime/Flagyl.  He was started on IV fluid.  Procalcitonin 4.5, lactate 1.0.   This morning, he remains in atrial flutter rate 70s.  He is more awake, able to converse with me. Denies dyspnea, still some abdominal discomfort but mild. BUN/creatinine remain high but trending down.   Review of Systems:  All systems reviewed and negative except as per HPI.   Home Medications Prior to Admission medications   Medication Sig Start Date End Date Taking? Authorizing Provider  acetaminophen (TYLENOL) 325 MG tablet Take 2 tablets (650 mg total) by mouth every 4 (four) hours as needed for headache or mild pain. 10/09/20  Yes Vann, Jessica U, DO  albuterol (PROVENTIL) (2.5 MG/3ML) 0.083% nebulizer  solution Take 3 mLs (2.5 mg total) by nebulization every 4 (four) hours as needed for wheezing or shortness of breath. 06/21/20  Yes Tanda Rockers, MD  albuterol (VENTOLIN HFA) 108 (90 Base) MCG/ACT inhaler Inhale 2 puffs into the lungs every 6 (six) hours as needed for wheezing or shortness of breath.   Yes [provider]  amiodarone (PACERONE) 200 MG tablet Take 0.5 tablets (100 mg total) by mouth daily. 08/28/20  Yes Larey Dresser, MD  apixaban (ELIQUIS) 2.5 MG TABS tablet Take 1 tablet (2.5 mg total) by mouth 2 (two) times daily. 11/01/20 01/30/21 Yes Hall, Carole N, DO  atorvastatin (LIPITOR) 40 MG tablet Take 1 tablet (40 mg total) by mouth daily. Patient taking differently: Take 40 mg by mouth at bedtime. 10/30/20  Yes Larey Dresser, MD  Budeson-Glycopyrrol-Formoterol (BREZTRI AEROSPHERE) 160-9-4.8 MCG/ACT AERO Inhale 2 puffs into the lungs 2 (two) times daily. 06/21/20  Yes Tanda Rockers, MD  carbamide peroxide (DEBROX) 6.5 % OTIC solution Place 5-10 drops into both ears 3 (three) times daily as needed (ear wax).   Yes [provider]  Carboxymethylcellulose Sod PF 0.25 % SOLN Place 1 drop into both eyes 4 (four) times daily.   Yes [provider]  cetirizine (ZYRTEC) 10 MG tablet Take 10 mg by mouth at bedtime.   Yes [provider]  Cholecalciferol (VITAMIN D) 50 MCG (2000 UT) CAPS Take 2,000 Units by mouth daily.   Yes [provider]  diazepam (VALIUM) 10 MG tablet Take 10 mg by mouth 3 (three) times daily as needed for  seizure. 11/22/20  Yes [provider]  famotidine (PEPCID) 20 MG tablet TAKE 1 TABLET(20 MG) BY MOUTH TWICE DAILY Patient taking differently: Take 20 mg by mouth 2 (two) times daily as needed for indigestion. 10/29/20  Yes Larey Dresser, MD  feeding supplement, GLUCERNA SHAKE, (GLUCERNA SHAKE) LIQD Take 237 mLs by mouth 3 (three) times daily between meals. 11/27/20  Yes Swayze, Ava, DO  ferrous sulfate 325 (65  FE) MG tablet Take 325 mg by mouth every Monday, Wednesday, and Friday at 6 PM.   Yes [provider]  fluticasone (FLONASE) 50 MCG/ACT nasal spray Place 2 sprays into both nostrils daily.   Yes [provider]  hydrocortisone (ANUSOL-HC) 2.5 % rectal cream Place 1 application rectally 2 (two) times daily as needed for hemorrhoids or itching.    Yes [provider]  hydrocortisone 1 % lotion Apply 1 application topically 2 (two) times daily. Back   Yes [provider]  insulin aspart (NOVOLOG) 100 UNIT/ML injection Inject 0-15 Units into the skin 3 (three) times daily with meals. Patient taking differently: Inject 4 Units into the skin 3 (three) times daily with meals. 10/09/20  Yes Vann, Jessica U, DO  insulin aspart protamine- aspart (NOVOLOG MIX 70/30) (70-30) 100 UNIT/ML injection Inject 10 Units into the skin 2 (two) times daily with a meal.   Yes [provider]  ipratropium-albuterol (DUONEB) 0.5-2.5 (3) MG/3ML SOLN Take 3 mLs by nebulization 4 (four) times daily as needed.   Yes [provider]  latanoprost (XALATAN) 0.005 % ophthalmic solution Place 1 drop into both eyes at bedtime.   Yes [provider]  lidocaine (LIDODERM) 5 % Place 1 patch onto the skin daily. Remove & Discard patch within 12 hours or as directed by MD Patient taking differently: Place 1 patch onto the skin daily as needed (pain). Remove & Discard patch within 12 hours or as directed by MD 10/10/20  Yes Geradine Girt, DO  Magnesium Oxide 420 MG TABS Take 420 mg by mouth daily. 12/24/16  Yes [provider]  oxybutynin (DITROPAN) 5 MG tablet Take 1 tablet by mouth 4 (four) times daily as needed for incontinence. 07/03/20  Yes [provider]  oxyCODONE-acetaminophen (PERCOCET) 10-325 MG tablet Take 1 tablet by mouth every 6 (six) hours as needed for pain.   Yes [provider]  pantoprazole (PROTONIX) 40 MG tablet Take 40 mg by mouth 2  (two) times daily. 07/03/20  Yes [provider]  polyethylene glycol powder (GLYCOLAX/MIRALAX) 17 GM/SCOOP powder Take 17 g by mouth daily as needed for constipation. 07/03/20  Yes [provider]  potassium chloride SA (KLOR-CON) 20 MEQ tablet Take 1 tablet (20 mEq total) by mouth 2 (two) times daily. Patient taking differently: Take 40 mEq by mouth 2 (two) times daily. 11/09/20 12/09/20 Yes Milford, Maricela Bo, FNP  senna-docusate (SENOKOT-S) 8.6-50 MG tablet Take 1 tablet by mouth at bedtime as needed. 07/03/20  Yes [provider]  silver sulfADIAZINE (SILVADENE) 1 % cream Apply topically daily. Patient taking differently: Apply 1 application topically daily. 11/01/20  Yes Kayleen Memos, DO  sodium chloride (OCEAN) 0.65 % SOLN nasal spray Place 2 sprays into both nostrils as needed for congestion.    Yes [provider]  tamsulosin (FLOMAX) 0.4 MG CAPS capsule Take 1 capsule (0.4 mg total) by mouth daily. Patient taking differently: Take 0.4 mg by mouth daily after breakfast. 10/09/20  Yes Vann, Jessica U, DO  terbinafine (LAMISIL)  1 % cream Apply 1 application topically daily. 03/10/20  Yes [provider]  torsemide (DEMADEX) 20 MG tablet Take 4 tablets (80 mg total) by mouth 2 (two) times daily. 11/18/19  Yes Larey Dresser, MD  umeclidinium bromide (INCRUSE ELLIPTA) 62.5 MCG/INH AEPB Inhale 1 puff into the lungs daily. 11/28/20  Yes Swayze, Ava, DO  zinc oxide 20 % ointment Apply 1 application topically 2 (two) times daily.   Yes [provider]  gabapentin (NEURONTIN) 300 MG capsule Take 300 mg by mouth at bedtime. Patient not taking: No sig reported 12/03/20   [provider]  Insulin Syringe 27G X 1/2" 0.5 ML MISC 100 Syringes by Does not apply route 3 (three) times daily. 08/29/19   Hosie Poisson, MD  OXYGEN Inhale 3 L/min into the lungs continuous.    [provider]  simethicone (MYLICON) 80 MG chewable tablet Chew 1 tablet  (80 mg total) by mouth every 6 (six) hours as needed for flatulence. Patient not taking: No sig reported 11/01/20   Kayleen Memos, DO    Past Medical History: Past Medical History:  Diagnosis Date  . Atrial flutter ([place])    a. recurrent AFlutter with RVR;  b. Amiodarone Rx started 4/16  . CAD (coronary artery disease)    a. LHC 1/16:  mLAD diffuse disease, pLCx mild disease, dLCx with disease but too small for PCI, RCA ok, EF 25-30%  . Chronic pain   . Chronic systolic CHF (congestive heart failure) ([place])   . COPD (chronic obstructive pulmonary disease) ([place])   . Diabetes mellitus without complication ([place])   . Ex-smoker 11/2017  . Hypercholesteremia   . Hypertension   . NICM (nonischemic cardiomyopathy) ([place])    a.dx 2016. b. 2D echo 06/2016 - Last echo 07/01/16: mod dilated LV, mod LVH, EF 25-30%, mild-mod MR, sev LAE, mild-mod reduced RV systolic function, mild-mod TR, PASP 67mHG.  .Marland KitchenPAF (paroxysmal atrial fibrillation) ([facility])    On amio - ot a candidate for flecainide due to cardiomyopathy, not a candidate for Tikosyn due to prolonged QT, and felt to be a poor candidate for ablation given left atrial size.  . Pulmonary hypertension ([place]   . Tobacco abuse     Past Surgical History: Past Surgical History:  Procedure Laterality Date  . BIOPSY  03/11/2019   Procedure: BIOPSY;  Surgeon: CLavena Bullion DO;  [place]  Service: Gastroenterology;;  . BIOPSY  03/15/2019   Procedure: BIOPSY;  Surgeon: MIrving Copas, MD;  [place]  Service: Gastroenterology;;  . CARDIOVERSION N/A 07/30/2018   Procedure: CARDIOVERSION;  Surgeon: MLarey Dresser MD;  [facility]  Service: Cardiovascular;  Laterality: N/A;  . COLONOSCOPY N/A 03/11/2019   Procedure: COLONOSCOPY;  Surgeon: CLavena Bullion DO;  [place]  Service: Gastroenterology;  Laterality: N/A;  . ENTEROSCOPY N/A 03/15/2019   Procedure: ENTEROSCOPY;  Surgeon:  MRush LandmarkGTelford Nab, MD;  [place]  Service: Gastroenterology;  Laterality: N/A;  . ESOPHAGOGASTRODUODENOSCOPY Left 03/11/2019   Procedure: ESOPHAGOGASTRODUODENOSCOPY (EGD);  Surgeon: CLavena Bullion DO;  [facility];  Service: Gastroenterology;  Laterality: Left;  . LEFT AND RIGHT HEART CATHETERIZATION WITH CORONARY ANGIOGRAM N/A 11/06/2014   Procedure: LEFT AND RIGHT HEART CATHETERIZATION WITH CORONARY ANGIOGRAM;  Surgeon: JJettie Booze MD;  [facility];  Service: Cardiovascular;  Laterality: N/A;  . RIGHT/LEFT HEART CATH AND CORONARY ANGIOGRAPHY N/A 12/06/2018   Procedure: RIGHT/LEFT HEART CATH AND CORONARY ANGIOGRAPHY;  Surgeon: Larey Dresser, MD;  Location: [place] CV LAB;  Service: Cardiovascular;  Laterality: N/A;  . SPLENECTOMY    . SUBMUCOSAL TATTOO INJECTION  03/15/2019   Procedure: SUBMUCOSAL TATTOO INJECTION;  Surgeon: Rush Landmark Telford Nab., MD;  Location: [facility]COPY;  Service: Gastroenterology;;    Family History: Family History  Problem Relation Age of Onset  . Heart disease Mother   . Hypertension Mother   . Heart failure Mother   . Heart disease Father   . Hypertension Sister   . Heart attack Neg Hx   . Stroke Neg Hx   . Colon cancer Neg Hx   . Esophageal cancer Neg Hx     Social History: Social History   Socioeconomic History  . Marital status: Married    Spouse name: Not on file  . Number of children: Not on file  . Years of education: Not on file  . Highest education level: Not on file  Occupational History  . Not on file  Tobacco Use  . Smoking status: Former Smoker    Packs/day: 1.00    Years: 34.00    Pack years: 34.00    Types: Cigarettes    Quit date: 11/30/2018    Years since quitting: 2.0  . Smokeless tobacco: Never Used  Vaping Use  . Vaping Use: Never used  Substance and Sexual Activity  . Alcohol use: No    Alcohol/week: 0.0 standard drinks  . Drug use: No  . Sexual activity: Not on  file  Other Topics Concern  . Not on file  Social History Narrative   ** Merged History Encounter **       Social Determinants of Health   Financial Resource Strain: Not on file  Food Insecurity: Not on file  Transportation Needs: Not on file  Physical Activity: Not on file  Stress: Not on file  Social Connections: Not on file    Allergies:  Allergies  Allergen Reactions  . Isosorb Dinitrate-Hydralazine Shortness Of Breath  . Benazepril Nausea And Vomiting  . Brimonidine Other (See Comments)    Dry eyes  . Metformin Other (See Comments)    Cramps and abdominal pain  . Morphine Rash    Objective:    Vital Signs:   Temp:  [97.9 F (36.6 C)-98.4 F (36.9 C)] 98.4 F (36.9 C) (02/09 0755) Pulse Rate:  [65-82] 68 (02/09 0624) Resp:  [13-27] 16 (02/09 0915) BP: (89-133)/(45-74) 129/61 (02/09 0900) SpO2:  [98 %-100 %] 100 % (02/09 0915) Weight:  [105.2 kg] 105.2 kg (02/08 1145)    Weight change: Filed Weights   12/11/20 1145  Weight: 105.2 kg    Intake/Output:   Intake/Output Summary (Last 24 hours) at 12/12/2020 1013 Last data filed at 12/12/2020 0440 Gross per 24 hour  Intake 2344.59 ml  Output 520 ml  Net 1824.59 ml      Physical Exam    General:  NAD HEENT: normal Neck: supple. JVP 8-9 cm. Carotids 2+ bilat; no bruits. No lymphadenopathy or thyromegaly appreciated. Cor: PMI nondisplaced. Regular rate & rhythm. No rubs, gallops or murmurs. Lungs: Crackles on left especially.  Abdomen: soft, nontender, mildly distended. No hepatosplenomegaly. No bruits or masses. Good bowel sounds. Extremities: no cyanosis, clubbing, rash, edema Neuro: alert & orientedx3, cranial nerves grossly intact. moves all 4 extremities w/o difficulty. Affect pleasant   Telemetry   Atrial flutter rate 70s (personally reviewed)  EKG    Atrial flutter, RBBB (personally reviewed)  Labs   Basic Metabolic  Panel: Recent Labs  Lab 12/11/20 0730 12/11/20 1703 12/11/20 2045  12/12/20 0419  NA 135  --  134* 136  K 2.5*  --  2.8* 2.7*  CL 86*  --  91* 94*  CO2 26  --  24 25  GLUCOSE 168*  --  125* 131*  BUN 138*  --  133* 128*  CREATININE 3.96*  --  3.56* 3.20*  CALCIUM 8.6*  --  7.8* 8.0*  MG  --  2.5*  --  2.6*    Liver Function Tests: Recent Labs  Lab 12/11/20 0730  AST 39  ALT 22  ALKPHOS 124  BILITOT 1.1  PROT 7.0  ALBUMIN 2.7*   Recent Labs  Lab 12/11/20 0730  LIPASE 58*   No results for input(s): AMMONIA in the last 168 hours.  CBC: Recent Labs  Lab 12/11/20 0730 12/12/20 0419  WBC 16.4* 13.0*  NEUTROABS  --  12.7*  HGB 11.4* 10.0*  HCT 37.0* 31.6*  MCV 83.3 84.0  PLT 422* 401*    Cardiac Enzymes: No results for input(s): CKTOTAL, CKMB, CKMBINDEX, TROPONINI in the last 168 hours.  BNP: BNP (last 3 results) Recent Labs    10/29/20 2252 11/21/20 1944 12/11/20 0730  BNP 2,913.5* 2,475.4* 1,858.3*    ProBNP (last 3 results) No results for input(s): PROBNP in the last 8760 hours.   CBG: Recent Labs  Lab 12/11/20 1910 12/12/20 0040 12/12/20 0306 12/12/20 0750  GLUCAP 119* 107* 113* 135*    Coagulation Studies: Recent Labs    12/12/20 0419  LABPROT 18.6*  INR 1.6*     Imaging   CT Abdomen Pelvis Wo Contrast  Result Date: 12/11/2020 CLINICAL DATA:  Abdominal distension. Abscess or infection suspected. Nausea and vomiting EXAM: CT ABDOMEN AND PELVIS WITHOUT CONTRAST TECHNIQUE: Multidetector CT imaging of the abdomen and pelvis was performed following the standard protocol without IV contrast. COMPARISON:  CT 10/05/2020 CT 01/03/2019 FINDINGS: Lower chest: Dense consolidation LEFT lung base. Interlobular septal thickening at the RIGHT lung base. 7 mm nodule in the RIGHT lower lobe on image 7/4. Interval improvement in RIGHT pleural effusion. LEFT basilar consolidation is worse. Hepatobiliary: No focal hepatic lesion on noncontrast exam. Gallbladder is distended to 5.7 cm. No radiodense gallstones. No  gallbladder inflammation. Intrahepatic duct dilatation on noncontrast exam Pancreas: Pancreas is normal. No ductal dilatation. No pancreatic inflammation. Spleen: Multiple splenules in the LEFT upper quadrant. Adrenals/urinary tract: Of LEFT adrenal gland thickening to 19 mm has attenuation greater than benign adenoma. No nephrolithiasis or ureterolithiasis Stomach/Bowel: Stomach, small bowel, appendix, and cecum are normal. Ascending transverse colon normal. There is moderate volume stool in the descending colon. Large volume stool in the rectosigmoid colon. Stool ball in the rectum measuring 7 cm. Vascular/Lymphatic: Abdominal aorta is normal caliber with atherosclerotic calcification. There is no retroperitoneal or periportal lymphadenopathy. No pelvic lymphadenopathy. Reproductive: Prostate unremarkable Other: No intraperitoneal free air Musculoskeletal: LEFT hip prosthetic.  No acute osseous findings IMPRESSION: 1. Large volume stool in the rectosigmoid suggest constipation. 2. Dense consolidation in the LEFT lower lobe new from comparison exam. 3. Improvement in RIGHT pleural effusion. 4. RIGHT lobe pulmonary nodule. Recommend follow-up CT chest in 3 months. 5. Distended gallbladder without evidence acute cholecystitis. 6. Nodular thickening of the LEFT adrenal gland similar to CT 01/03/2019 Electronically Signed   By: Suzy Bouchard M.D.   On: 12/11/2020 14:28   CT Head Wo Contrast  Result Date: 12/11/2020 CLINICAL DATA:  Altered mental status. EXAM: CT HEAD  WITHOUT CONTRAST TECHNIQUE: Contiguous axial images were obtained from the base of the skull through the vertex without intravenous contrast. COMPARISON:  Head CT scan 04/09/2019. FINDINGS: Brain: No evidence of acute infarction, hemorrhage, hydrocephalus, extra-axial collection or mass lesion/mass effect. Chronic microvascular ischemic change and cavum septum pellucidum et vergae noted. Vascular: No hyperdense vessel.  Atherosclerosis is seen.  Skull: Intact.  No focal lesion. Sinuses/Orbits: Negative. Other: None. IMPRESSION: No acute abnormality. Chronic microvascular ischemic change. Atherosclerosis. Electronically Signed   By: Inge Rise M.D.   On: 12/11/2020 14:00   US RENAL  Result Date: 12/11/2020 CLINICAL DATA:  Acute renal failure EXAM: RENAL / URINARY TRACT ULTRASOUND COMPLETE COMPARISON:  None. FINDINGS: Right Kidney: Renal measurements: 10.6 x 6.1 x 5.6 cm = volume: 188 mL. Echogenicity within normal limits. No mass or hydronephrosis visualized. Left Kidney: Renal measurements: 10.9 x 5.3 x 4.8 cm = volume: 143 mL. Echogenicity within normal limits. No mass or hydronephrosis visualized. Bladder: Bladder decompressed and poorly assessed by sonography. Other: None. IMPRESSION: 1. Unremarkable sonographic appearance of the kidneys. 2. Decompressed urinary bladder poorly assessed by sonography. Electronically Signed   By: Lovena Le M.D.   On: 12/11/2020 18:44   DG Chest Port 1 View  Result Date: 12/11/2020 CLINICAL DATA:  Weakness EXAM: PORTABLE CHEST 1 VIEW COMPARISON:  11/21/2020 FINDINGS: Cardiac shadow is enlarged but stable. Aortic calcifications are again seen. Persistent left basilar airspace disease with associated effusion is noted. Some interval clearing in the right base is noted. Persistent central vascular congestion is seen. IMPRESSION: Persistent vascular congestion. Stable left basilar airspace opacity with a fusion although clearing in the right base is noted. Electronically Signed   By: Inez Catalina M.D.   On: 12/11/2020 12:02      Medications:     Current Medications: . [START ON 12/13/2020] amiodarone  200 mg Oral Daily  . atorvastatin  40 mg Oral Daily  . insulin aspart  0-15 Units Subcutaneous Q4H  . lidocaine  1 patch Transdermal Q24H  . methylPREDNISolone (SOLU-MEDROL) injection  60 mg Intravenous BID  . polyethylene glycol  17 g Oral Daily  . sodium chloride flush  3 mL Intravenous Q12H  .  tamsulosin  0.4 mg Oral QPC breakfast  . umeclidinium bromide  1 puff Inhalation Daily     Infusions: . ceFEPime (MAXIPIME) IV    . heparin 1,350 Units/hr (12/12/20 0630)  . lactated ringers 125 mL/hr at 12/11/20 2053  . metronidazole Stopped (12/12/20 0902)  . potassium chloride 10 mEq (12/12/20 0919)  . [START ON 12/13/2020] vancomycin        Assessment/Plan   1. ID: L-sided PNA on CXR, PCT elevated 4.5.  He was admitted with sepsis.  Currently afebrile.  - on cefepime, vancomycin, Flagyl per primary service.  2.Chronic diastolic CHFwith prominent RV failure: Likely related to COPD/OSA/OHS.H/o poor compliance w/ home O2 and BiPAP. Echo in 11/21 withEF 50-55%, RV severely reduced w/ severely elevated RV systolic pressure, 68 mmHg.  Poor po intake recently with nausea/vomiting, now with AKI on CKD stage 3.  He has been getting IV fluid.  On exam, he does have mild JVD now.  - He was written for 500 cc total NS.  Would stop IV fluid after this finishes.  Can start on clear liquids by mouth.  - Hold home torsemide for now.  - Will need to follow closely, has prominent RV failure with tendency towards volume overload/CHF.  3. Chronic Hypoxic/Hypercarbic Respiratory failure: Due to CHF +  COPD/OSA/OHS w/ poor compliance w/ home O2 and BiPAP. Now also with PNA.   - Continue daytime O2 and BiPAP during sleep.  4. AKI onStage3CKD: Baseline creatinine 1.8, up to 3.56 this admission, now down some to 3.2.  - Follow closely, may need nephrology consult if does not continue to impove.   5.Atrial fibrillation/flutter:Paroxysmal. He has history of CVA. He had DCCV in 3/20 and again in 5/20. He failed amiodarone in the past and cannot take Tikosyn due to baseline prolonged QT interval. However, he has been back on amiodarone and was in NSR when I last saw him.  This admission, he is back in atrial flutter with controlled rate.  - Heparin gtt for now.   - Increase amiodarone to 200 mg  daily.  - When clinically improved, would consider TEE-guided DCCV to get him back in NSR.  6. COPD:No longer smokes. Continue homeoxygen.  - Started on Solumedrol in addition to abx by primary service.  7.OHS/OSA:Continue oxygen during the day andBIPAP at night.  8.CAD:Nonobstructive disease in 2/20. No chest pain.  - no ASA due to anticoagulation.  - Atorvastatin.  9. Hypokalemia: Will need aggressive K supplementation.    Length of Stay: 1  Loralie Champagne, MD  12/12/2020, 10:13 AM  Advanced Heart Failure Team Pager [phone] (M-F; 7a - 4p)  Please contact [facility] for night-coverage after hours (4p -7a ) and weekends on [email or web address]

## 2020-12-12 NOTE — ED Notes (Signed)
Pt with moderated formed BM, linens charged and pericare done

## 2020-12-12 NOTE — Progress Notes (Addendum)
PROGRESS NOTE    Nicholas Caldwell   OVF:[id number]  DOB: 09/01/1946  DOA: 12/11/2020 PCP: [name], MD   Brief Narrative:  Nicholas Caldwell is a 75 yo male with PMH chronic dCHF, CKD4, afib (on Eliquis), COPD (on chronic 3-4 L O2), OHS/OSA, HTN, HLD, pulmonary HTN, DMII who presented to the ER with abdominal distention/bloating and pain that had been going on for approximately 2 weeks.  He also had associated nausea/vomiting.  He was very lethargic in the ER but able to provide some information.  He endorsed no chest pain or worsening shortness of breath despite imaging findings and only endorsed a mild nonproductive cough. He states he is on diuretics at home but states he was not taking them recently; he has also had poor appetite.  In the ED: Chest x-ray reveals large left-sided consolidation concerning for pneumonia. CT of the abdomen pelvis reveals a large stool burden with a 7 cm stool ball in the rectum. BUN 138, creatinine 3.96, potassium 2.5 WBC count 16.4 Covid negative  The patient received vancomycin and cefepime in the ED and was admitted to the hospital. Patient was hospitalized this past January and released on 11/27/2020 after being treated for acute on chronic heart failure.   Subjective: Ongoing abdominal pain which she states is worse when he tries to move.  The patient states that his abdomen has been painful and distended for almost 3 weeks now.  He has had bowel movements.  He has had nausea but no vomiting.    Assessment & Plan:   Principal Problem:   Severe sepsis -Secondary to left sided pneumonia -He was recently hospitalized, he is being treated as hospital-acquired infection -WBC 16.4 on admission-noted to be improving -Procalcitonin 4.50-has improved to 3.22 - We will check MRSA PCR and if negative, will DC vancomycin -Follow-up on blood cultures -We will stop steroids as I see no evidence of a COPD exacerbation and BP has improved after IV  fluids  Active Problems: Acute renal failure superimposed on stage 4 chronic kidney disease (HCC) -Due to poor oral intake in setting of diuretic use -He has received IV fluids and creatinine is improving -Continue to hold diuretics and continue to follow creatinine -Appreciate cardiology involvement  Abdominal pain and severe abdominal distention -Secondary to large amount of stool which is likely related to his chronic use of oxycodone -Dulcolax suppository was given along with MiraLAX and has been ineffective -We will order soapsuds enema -The patient should be on stool softeners/laxatives as long as he is continued on narcotics  Hypokalemia -Potassium is 2.7 today-likely secondary to diuretics -Continue aggressive replacement and follow -Magnesium level is 2.6 and does not require replacement  Chronic pain -He states his pain is chronically present in his back and his hips -Home dose of oxycodone/acetaminophen 10 mg every 6 hours as needed has been resumed -I have explained to him the relationship between narcotics and constipation     PAF (paroxysmal atrial fibrillation)  -Continue amiodarone -Eliquis is on hold-we will resume    COPD (chronic obstructive pulmonary disease) (HCC)   Chronic respiratory failure with hypoxia  -He remains on 3 to 4 L of oxygen in the hospital    Diabetes mellitus type 2 in obese ([place]) -Patient takes 70/30 at home -He is currently on a sliding scale  BPH -Continue Flomax  Right lower lobe pulmonary nodule -Will need outpatient follow-up   Time spent in minutes: 35 minutes DVT prophylaxis: Heparin/Eliquis Code Status: Full code Family Communication:  Wife at the bedside Level of Care: Level of care: Progressive Disposition Plan:  Status is: Inpatient  Remains inpatient appropriate because:IV treatments appropriate due to intensity of illness or inability to take PO   Dispo: The patient is from: Home              Anticipated d/c  is to: Home              Anticipated d/c date is: 3 days              Patient currently is not medically stable to d/c.   Difficult to place patient No      Consultants:   Cardiology Procedures:   None Antimicrobials:  Anti-infectives (From admission, onward)   Start     Dose/Rate Route Frequency Ordered Stop   12/13/20 1730  vancomycin (VANCOREADY) IVPB 1250 mg/250 mL        1,250 mg 166.7 mL/hr over 90 Minutes Intravenous Every 48 hours 12/12/20 0902     12/12/20 1500  ceFEPIme (MAXIPIME) 2 g in sodium chloride 0.9 % 100 mL IVPB        2 g 200 mL/hr over 30 Minutes Intravenous Every 24 hours 12/11/20 1503     12/11/20 1600  vancomycin (VANCOREADY) IVPB 1000 mg/200 mL  Status:  Discontinued        1,000 mg 200 mL/hr over 60 Minutes Intravenous  Once 12/11/20 1547 12/11/20 1553   12/11/20 1600  ceFEPIme (MAXIPIME) 2 g in sodium chloride 0.9 % 100 mL IVPB  Status:  Discontinued        2 g 200 mL/hr over 30 Minutes Intravenous  Once 12/11/20 1547 12/11/20 1553   12/11/20 1600  metroNIDAZOLE (FLAGYL) IVPB 500 mg        500 mg 100 mL/hr over 60 Minutes Intravenous Every 8 hours 12/11/20 1550     12/11/20 1509  vancomycin variable dose per unstable renal function (pharmacist dosing)  Status:  Discontinued         Does not apply See admin instructions 12/11/20 1509 12/12/20 0902   12/11/20 1500  vancomycin (VANCOCIN) 2,500 mg in sodium chloride 0.9 % 500 mL IVPB        2,500 mg 250 mL/hr over 120 Minutes Intravenous  Once 12/11/20 1459 12/11/20 1919   12/11/20 1500  ceFEPIme (MAXIPIME) 2 g in sodium chloride 0.9 % 100 mL IVPB        2 g 200 mL/hr over 30 Minutes Intravenous  Once 12/11/20 1459 12/11/20 1718       Objective: Vitals:   12/12/20 0755 12/12/20 0900 12/12/20 0915 12/12/20 1034  BP:  129/61  133/73  Pulse:    67  Resp:  _0 Temp: 98.4 F (36.9 C)     TempSrc: Oral     SpO2:   100% 100%  Weight:      Height:        Intake/Output Summary (Last 24  hours) at 12/12/2020 1116 Last data filed at 12/12/2020 0440 Gross per 24 hour  Intake 2344.59 ml  Output 520 ml  Net 1824.59 ml   Filed Weights   12/11/20 1145  Weight: 105.2 kg    Examination: General exam: Appears comfortable  HEENT: PERRLA, oral mucosa moist, no sclera icterus or thrush Respiratory system: Clear to auscultation. Respiratory effort normal. Cardiovascular system: S1 & S2 heard, RRR.   Gastrointestinal system: Abdomen soft, severely distended, mild diffuse tenderness, normal bowel sounds. Central nervous system: Alert  and oriented. No focal neurological deficits. Extremities: No cyanosis, clubbing or edema Skin: No rashes or ulcers Psychiatry:  Mood & affect appropriate.     Data Reviewed: I have personally reviewed following labs and imaging studies  CBC: Recent Labs  Lab 12/11/20 0730 12/12/20 0419  WBC 16.4* 13.0*  NEUTROABS  --  12.7*  HGB 11.4* 10.0*  HCT 37.0* 31.6*  MCV 83.3 84.0  PLT 422* 542*   Basic Metabolic Panel: Recent Labs  Lab 12/11/20 0730 12/11/20 1703 12/11/20 2045 12/12/20 0419  NA 135  --  134* 136  K 2.5*  --  2.8* 2.7*  CL 86*  --  91* 94*  CO2 26  --  24 25  GLUCOSE 168*  --  125* 131*  BUN 138*  --  133* 128*  CREATININE 3.96*  --  3.56* 3.20*  CALCIUM 8.6*  --  7.8* 8.0*  MG  --  2.5*  --  2.6*   GFR: Estimated Creatinine Clearance: 26.1 mL/min (A) (by C-G formula based on SCr of 3.2 mg/dL (H)). Liver Function Tests: Recent Labs  Lab 12/11/20 0730  AST 39  ALT 22  ALKPHOS 124  BILITOT 1.1  PROT 7.0  ALBUMIN 2.7*   Recent Labs  Lab 12/11/20 0730  LIPASE 58*   No results for input(s): AMMONIA in the last 168 hours. Coagulation Profile: Recent Labs  Lab 12/12/20 0419  INR 1.6*   Cardiac Enzymes: No results for input(s): CKTOTAL, CKMB, CKMBINDEX, TROPONINI in the last 168 hours. BNP (last 3 results) No results for input(s): PROBNP in the last 8760 hours. HbA1C: Recent Labs    12/11/20 2045   HGBA1C 6.8*   CBG: Recent Labs  Lab 12/11/20 1910 12/12/20 0040 12/12/20 0306 12/12/20 0750  GLUCAP 119* 107* 113* 135*   Lipid Profile: No results for input(s): CHOL, HDL, LDLCALC, TRIG, CHOLHDL, LDLDIRECT in the last 72 hours. Thyroid Function Tests: Recent Labs    12/11/20 1708  TSH 1.018   Anemia Panel: No results for input(s): VITAMINB12, FOLATE, FERRITIN, TIBC, IRON, RETICCTPCT in the last 72 hours. Urine analysis:    Component Value Date/Time   COLORURINE YELLOW 12/11/2020 1107   APPEARANCEUR HAZY (A) 12/11/2020 1107   LABSPEC 1.020 12/11/2020 1107   PHURINE 5.5 12/11/2020 1107   GLUCOSEU NEGATIVE 12/11/2020 1107   HGBUR NEGATIVE 12/11/2020 1107   BILIRUBINUR NEGATIVE 12/11/2020 1107   KETONESUR NEGATIVE 12/11/2020 1107   PROTEINUR NEGATIVE 12/11/2020 1107   UROBILINOGEN 0.2 10/26/2014 2206   NITRITE NEGATIVE 12/11/2020 1107   LEUKOCYTESUR NEGATIVE 12/11/2020 1107   Sepsis Labs: _0 (procalcitonin:4,lacticidven:4) ) Recent Results (from the past 240 hour(s))  Culture, blood (single)     Status: None (Preliminary result)   Collection Time: 12/11/20  2:58 PM   Specimen: BLOOD  Result Value Ref Range Status   Specimen Description BLOOD SITE NOT SPECIFIED  Final   Special Requests   Final    BOTTLES DRAWN AEROBIC AND ANAEROBIC Blood Culture adequate volume   Culture   Final    NO GROWTH < 24 HOURS Performed at [facility], [place] [address], [place], [place] [address]    Report Status PENDING  Incomplete         Radiology Studies: CT Abdomen Pelvis Wo Contrast  Result Date: 12/11/2020 CLINICAL DATA:  Abdominal distension. Abscess or infection suspected. Nausea and vomiting EXAM: CT ABDOMEN AND PELVIS WITHOUT CONTRAST TECHNIQUE: Multidetector CT imaging of the abdomen and pelvis was performed following the standard  protocol without IV contrast. COMPARISON:  CT 10/05/2020 CT 01/03/2019 FINDINGS: Lower chest: Dense consolidation LEFT lung base.  Interlobular septal thickening at the RIGHT lung base. 7 mm nodule in the RIGHT lower lobe on image 7/4. Interval improvement in RIGHT pleural effusion. LEFT basilar consolidation is worse. Hepatobiliary: No focal hepatic lesion on noncontrast exam. Gallbladder is distended to 5.7 cm. No radiodense gallstones. No gallbladder inflammation. Intrahepatic duct dilatation on noncontrast exam Pancreas: Pancreas is normal. No ductal dilatation. No pancreatic inflammation. Spleen: Multiple splenules in the LEFT upper quadrant. Adrenals/urinary tract: Of LEFT adrenal gland thickening to 19 mm has attenuation greater than benign adenoma. No nephrolithiasis or ureterolithiasis Stomach/Bowel: Stomach, small bowel, appendix, and cecum are normal. Ascending transverse colon normal. There is moderate volume stool in the descending colon. Large volume stool in the rectosigmoid colon. Stool ball in the rectum measuring 7 cm. Vascular/Lymphatic: Abdominal aorta is normal caliber with atherosclerotic calcification. There is no retroperitoneal or periportal lymphadenopathy. No pelvic lymphadenopathy. Reproductive: Prostate unremarkable Other: No intraperitoneal free air Musculoskeletal: LEFT hip prosthetic.  No acute osseous findings IMPRESSION: 1. Large volume stool in the rectosigmoid suggest constipation. 2. Dense consolidation in the LEFT lower lobe new from comparison exam. 3. Improvement in RIGHT pleural effusion. 4. RIGHT lobe pulmonary nodule. Recommend follow-up CT chest in 3 months. 5. Distended gallbladder without evidence acute cholecystitis. 6. Nodular thickening of the LEFT adrenal gland similar to CT 01/03/2019 Electronically Signed   By: Suzy Bouchard M.D.   On: 12/11/2020 14:28   CT Head Wo Contrast  Result Date: 12/11/2020 CLINICAL DATA:  Altered mental status. EXAM: CT HEAD WITHOUT CONTRAST TECHNIQUE: Contiguous axial images were obtained from the base of the skull through the vertex without intravenous  contrast. COMPARISON:  Head CT scan 04/09/2019. FINDINGS: Brain: No evidence of acute infarction, hemorrhage, hydrocephalus, extra-axial collection or mass lesion/mass effect. Chronic microvascular ischemic change and cavum septum pellucidum et vergae noted. Vascular: No hyperdense vessel.  Atherosclerosis is seen. Skull: Intact.  No focal lesion. Sinuses/Orbits: Negative. Other: None. IMPRESSION: No acute abnormality. Chronic microvascular ischemic change. Atherosclerosis. Electronically Signed   By: Inge Rise M.D.   On: 12/11/2020 14:00   US RENAL  Result Date: 12/11/2020 CLINICAL DATA:  Acute renal failure EXAM: RENAL / URINARY TRACT ULTRASOUND COMPLETE COMPARISON:  None. FINDINGS: Right Kidney: Renal measurements: 10.6 x 6.1 x 5.6 cm = volume: 188 mL. Echogenicity within normal limits. No mass or hydronephrosis visualized. Left Kidney: Renal measurements: 10.9 x 5.3 x 4.8 cm = volume: 143 mL. Echogenicity within normal limits. No mass or hydronephrosis visualized. Bladder: Bladder decompressed and poorly assessed by sonography. Other: None. IMPRESSION: 1. Unremarkable sonographic appearance of the kidneys. 2. Decompressed urinary bladder poorly assessed by sonography. Electronically Signed   By: Lovena Le M.D.   On: 12/11/2020 18:44   DG Chest Port 1 View  Result Date: 12/11/2020 CLINICAL DATA:  Weakness EXAM: PORTABLE CHEST 1 VIEW COMPARISON:  11/21/2020 FINDINGS: Cardiac shadow is enlarged but stable. Aortic calcifications are again seen. Persistent left basilar airspace disease with associated effusion is noted. Some interval clearing in the right base is noted. Persistent central vascular congestion is seen. IMPRESSION: Persistent vascular congestion. Stable left basilar airspace opacity with a fusion although clearing in the right base is noted. Electronically Signed   By: Inez Catalina M.D.   On: 12/11/2020 12:02      Scheduled Meds:  [START ON 12/13/2020] amiodarone  200 mg Oral Daily    atorvastatin  40  mg Oral Daily   insulin aspart  0-15 Units Subcutaneous Q4H   lidocaine  1 patch Transdermal Q24H   methylPREDNISolone (SOLU-MEDROL) injection  60 mg Intravenous BID   polyethylene glycol  17 g Oral Daily   sodium chloride flush  3 mL Intravenous Q12H   tamsulosin  0.4 mg Oral QPC breakfast   umeclidinium bromide  1 puff Inhalation Daily   Continuous Infusions:  ceFEPime (MAXIPIME) IV     heparin 1,350 Units/hr (12/12/20 0630)   lactated ringers 125 mL/hr at 12/11/20 2053   metronidazole Stopped (12/12/20 0902)   potassium chloride 10 mEq (12/12/20 1056)   [START ON 12/13/2020] vancomycin       LOS: 1 day      Debbe Odea, MD [facility] Pager: [email or web address] 12/12/2020, 11:16 AM

## 2020-12-12 NOTE — ED Notes (Signed)
Unable to locate supplies for soap suds enema. Will continue to ask for resources

## 2020-12-12 NOTE — Progress Notes (Signed)
[place] for Heparin Indication: atrial fibrillation  Allergies  Allergen Reactions  . Isosorb Dinitrate-Hydralazine Shortness Of Breath  . Benazepril Nausea And Vomiting  . Brimonidine Other (See Comments)    Dry eyes  . Metformin Other (See Comments)    Cramps and abdominal pain  . Morphine Rash    Patient Measurements: Height: 6' (182.9 cm) Weight: 105.2 kg (231 lb 14.8 oz) IBW/kg (Calculated) : 77.6 Heparin Dosing Weight: 99.5 kg  Vital Signs: BP: 109/58 (02/09 0318) Pulse Rate: 68 (02/09 0318)  Labs: Recent Labs    12/11/20 0730 12/11/20 1703 12/11/20 2045 12/12/20 0419 12/12/20 0420  HGB 11.4*  --   --  10.0*  --   HCT 37.0*  --   --  31.6*  --   PLT 422*  --   --  401*  --   APTT  --   --   --  160*  --   LABPROT  --   --   --  18.6*  --   INR  --   --   --  1.6*  --   HEPARINUNFRC  --   --   --   --  >2.20*  CREATININE 3.96*  --  3.56* 3.20*  --   TROPONINIHS  --  185*  --   --   --     Estimated Creatinine Clearance: 26.1 mL/min (A) (by C-G formula based on SCr of 3.2 mg/dL (H)).   Assessment: Patient is a 39 yom that is being admitted for altered mental status. The patient has a hx of afib with a chads-vasc score of ~ 7. Patient does take Apixaban pta for afib however this is being held due to mental status. Of note apparently nephrology decreased his home Apixaban dose to 2.5 mg BID in place of the 5 mg BID recommended dose based on  Age weight and renal function. Pharmacy has been asked to start heparin while holding apixaban. Last dose of Apixaban was at 2100 on 2/7.  Heparin level >2.2 (affected by apixaban), PTT 160 sec (supratherapeutic) on gtt at 1650 units/hr. Verified with RN that labs drawn from arm opposite where heparin running. No bleeding noted.  Goal of Therapy:  aPTT 66-102 seconds Heparin level 0.3-0.7 units/ml Monitor platelets by anticoagulation protocol: Yes   Plan:  Hold heparin x 1  hour Restart heparin at 1350 units/hr Will f/u 8 hr PTT  Sherlon Handing, PharmD, BCPS Please see amion for complete clinical pharmacist phone list 12/12/2020,5:35 AM

## 2020-12-13 DIAGNOSIS — A419 Sepsis, unspecified organism: Secondary | ICD-10-CM | POA: Diagnosis not present

## 2020-12-13 DIAGNOSIS — I5023 Acute on chronic systolic (congestive) heart failure: Secondary | ICD-10-CM | POA: Diagnosis not present

## 2020-12-13 DIAGNOSIS — R6520 Severe sepsis without septic shock: Secondary | ICD-10-CM | POA: Diagnosis not present

## 2020-12-13 LAB — BASIC METABOLIC PANEL
Anion gap: 17 — ABNORMAL HIGH (ref 5–15)
Anion gap: 16 — ABNORMAL HIGH (ref 5–15)
BUN: 106 mg/dL — ABNORMAL HIGH (ref 8–23)
BUN: 95 mg/dL — ABNORMAL HIGH (ref 8–23)
CO2: 25 mmol/L (ref 22–32)
CO2: 23 mmol/L (ref 22–32)
Calcium: 8.6 mg/dL — ABNORMAL LOW (ref 8.9–10.3)
Calcium: 8.7 mg/dL — ABNORMAL LOW (ref 8.9–10.3)
Chloride: 100 mmol/L (ref 98–111)
Chloride: 102 mmol/L (ref 98–111)
Creatinine, Ser: 2.49 mg/dL — ABNORMAL HIGH (ref 0.61–1.24)
Creatinine, Ser: 2.3 mg/dL — ABNORMAL HIGH (ref 0.61–1.24)
GFR, Estimated: 27 mL/min — ABNORMAL LOW (ref >60)
GFR, Estimated: 29 mL/min — ABNORMAL LOW (ref >60)
Glucose, Bld: 177 mg/dL — ABNORMAL HIGH (ref 70–99)
Glucose, Bld: 155 mg/dL — ABNORMAL HIGH (ref 70–99)
Potassium: 3 mmol/L — ABNORMAL LOW (ref 3.5–5.1)
Potassium: 3.4 mmol/L — ABNORMAL LOW (ref 3.5–5.1)
Sodium: 142 mmol/L (ref 135–145)
Sodium: 141 mmol/L (ref 135–145)

## 2020-12-13 LAB — CBC WITH DIFFERENTIAL/PLATELET
Abs Immature Granulocytes: 0.23 10*3/uL — ABNORMAL HIGH (ref 0.00–0.07)
Basophils Absolute: 0 10*3/uL (ref 0.0–0.1)
Basophils Relative: 0 %
Eosinophils Absolute: 0 10*3/uL (ref 0.0–0.5)
Eosinophils Relative: 0 %
HCT: 31.1 % — ABNORMAL LOW (ref 39.0–52.0)
Hemoglobin: 9.8 g/dL — ABNORMAL LOW (ref 13.0–17.0)
Immature Granulocytes: 2 %
Lymphocytes Relative: 2 %
Lymphs Abs: 0.4 10*3/uL — ABNORMAL LOW (ref 0.7–4.0)
MCH: 26.3 pg (ref 26.0–34.0)
MCHC: 31.5 g/dL (ref 30.0–36.0)
MCV: 83.4 fL (ref 80.0–100.0)
Monocytes Absolute: 1.2 10*3/uL — ABNORMAL HIGH (ref 0.1–1.0)
Monocytes Relative: 8 %
Neutro Abs: 13 10*3/uL — ABNORMAL HIGH (ref 1.7–7.7)
Neutrophils Relative %: 88 %
Platelets: 394 10*3/uL (ref 150–400)
RBC: 3.73 MIL/uL — ABNORMAL LOW (ref 4.22–5.81)
RDW: 18.9 % — ABNORMAL HIGH (ref 11.5–15.5)
WBC: 14.8 10*3/uL — ABNORMAL HIGH (ref 4.0–10.5)
nRBC: 0 % (ref 0.0–0.2)

## 2020-12-13 LAB — GLUCOSE, CAPILLARY
Glucose-Capillary: 184 mg/dL — ABNORMAL HIGH (ref 70–99)
Glucose-Capillary: 152 mg/dL — ABNORMAL HIGH (ref 70–99)
Glucose-Capillary: 191 mg/dL — ABNORMAL HIGH (ref 70–99)
Glucose-Capillary: 144 mg/dL — ABNORMAL HIGH (ref 70–99)
Glucose-Capillary: 160 mg/dL — ABNORMAL HIGH (ref 70–99)
Glucose-Capillary: 117 mg/dL — ABNORMAL HIGH (ref 70–99)

## 2020-12-13 LAB — MRSA PCR SCREENING: MRSA by PCR: NEGATIVE

## 2020-12-13 LAB — PROCALCITONIN: Procalcitonin: 1.52 ng/mL

## 2020-12-13 LAB — MAGNESIUM: Magnesium: 2.7 mg/dL — ABNORMAL HIGH (ref 1.7–2.4)

## 2020-12-13 MED ORDER — SODIUM CHLORIDE 0.9 % IV SOLN
2.0000 g | Freq: Two times a day (BID) | INTRAVENOUS | Status: DC
  Administered 2020-12-13 – 2020-12-17 (×9): 2 g via INTRAVENOUS
  Filled 2020-12-13 (×9): qty 2

## 2020-12-13 MED ORDER — METRONIDAZOLE 500 MG PO TABS
500.0000 mg | ORAL_TABLET | Freq: Three times a day (TID) | ORAL | Status: DC
  Administered 2020-12-13 – 2020-12-17 (×12): 500 mg via ORAL
  Filled 2020-12-13 (×12): qty 1

## 2020-12-13 MED ORDER — BISACODYL 10 MG RE SUPP
10.0000 mg | Freq: Four times a day (QID) | RECTAL | Status: AC
  Filled 2020-12-13: qty 1

## 2020-12-13 MED ORDER — POTASSIUM CHLORIDE CRYS ER 20 MEQ PO TBCR
40.0000 meq | EXTENDED_RELEASE_TABLET | ORAL | Status: AC
  Administered 2020-12-13 (×3): 40 meq via ORAL
  Filled 2020-12-13 (×3): qty 2

## 2020-12-13 MED ORDER — VANCOMYCIN HCL 750 MG/150ML IV SOLN
750.0000 mg | INTRAVENOUS | Status: DC
  Administered 2020-12-13: 750 mg via INTRAVENOUS
  Filled 2020-12-13: qty 150

## 2020-12-13 MED ORDER — POLYETHYLENE GLYCOL 3350 17 GM/SCOOP PO POWD
1.0000 | Freq: Once | ORAL | Status: AC
  Administered 2020-12-13: 255 g via ORAL
  Filled 2020-12-13: qty 255

## 2020-12-13 MED ORDER — POTASSIUM CHLORIDE CRYS ER 20 MEQ PO TBCR: 40.0000 meq | EXTENDED_RELEASE_TABLET | Freq: Four times a day (QID) | ORAL | Status: DC

## 2020-12-13 NOTE — Progress Notes (Addendum)
PROGRESS NOTE    Nicholas Caldwell   ZOX:[id number]  DOB: 11/27/1945  DOA: 12/11/2020 PCP: [name], MD   Brief Narrative:  Nicholas Caldwell is a 75 yo male with PMH chronic dCHF, CKD4, afib (on Eliquis), COPD (on chronic 3-4 L O2), OHS/OSA, HTN, HLD, pulmonary HTN, DMII who presented to the ER with abdominal distention/bloating and pain that had been going on for approximately 2 weeks.  He also had associated nausea/vomiting.  He was very lethargic in the ER but able to provide some information.  He endorsed no chest pain or worsening shortness of breath despite imaging findings and only endorsed a mild nonproductive cough. He states he is on diuretics at home but states he was not taking them recently; he has also had poor appetite.  In the ED: Chest x-ray reveals large left-sided consolidation concerning for pneumonia. CT of the abdomen pelvis reveals a large stool burden with a 7 cm stool ball in the rectum. BUN 138, creatinine 3.96, potassium 2.5 WBC count 16.4 Covid negative  The patient received vancomycin and cefepime in the ED and was admitted to the hospital. Patient was hospitalized this past January and released on 11/27/2020 after being treated for acute on chronic heart failure.   Subjective: He is still having abdominal distension and has not had a BM as of yet.   Assessment & Plan:   Principal Problem:   Severe sepsis -Secondary to left sided pneumonia -He was recently hospitalized, he is being treated as hospital-acquired infection -WBC 16.4 on admission-noted to be improving -Procalcitonin 4.50-has improved to 3.22 - MRSA PCR is negative- stop Vanc -  blood cultures NGTD - stopped steroids as I see no evidence of a COPD exacerbation and BP has improved after IV fluids  Active Problems: Acute renal failure superimposed on stage 4 chronic kidney disease (HCC) -Due to poor oral intake in setting of diuretic use -He has received IV fluids and creatinine is  improving -Continue to hold diuretics and continue to follow creatinine -Appreciate cardiology involvement  Abdominal pain and severe abdominal distention -Secondary to large amount of stool which is likely related to his chronic use of oxycodone -Dulcolax suppository was given along with MiraLAX and has been ineffective - ordered soapsuds enema and SMOG enema also ineffective yesterday - give tap water enema and start a container of Miralax (colon prep) -The patient should be on stool softeners/laxatives as long as he is continued on narcotics  Hypokalemia  -likely secondary to diuretics -Continue aggressive replacement and follow -Magnesium level is 2.6 and does not require replacement  Chronic pain -He states his pain is chronically present in his back and his hips -Home dose of oxycodone/acetaminophen 10 mg every 6 hours as needed has been resumed -I have explained to him the relationship between narcotics and constipation     PAF (paroxysmal atrial fibrillation)  -Continue amiodarone and Eliquis    COPD (chronic obstructive pulmonary disease) (HCC)   Chronic respiratory failure with hypoxia  -He remains on 3 to 4 L of oxygen in the hospital    Diabetes mellitus type 2 in obese ([place]) -Patient takes 70/30 at home -He is currently on a sliding scale  BPH -Continue Flomax  Right lower lobe pulmonary nodule -Will need outpatient follow-up   Time spent in minutes: 35 minutes DVT prophylaxis: Heparin/Eliquis Code Status: Full code Family Communication: Wife at the bedside Level of Care: Level of care: Telemetry Medical Disposition Plan:  Status is: Inpatient  Remains inpatient appropriate because:IV  treatments appropriate due to intensity of illness or inability to take PO   Dispo: The patient is from: Home              Anticipated d/c is to: Home              Anticipated d/c date is: 3 days              Patient currently is not medically stable to d/c.   Difficult  to place patient No      Consultants:   Cardiology Procedures:   None Antimicrobials:  Anti-infectives (From admission, onward)   Start     Dose/Rate Route Frequency Ordered Stop   12/13/20 1730  vancomycin (VANCOREADY) IVPB 1250 mg/250 mL  Status:  Discontinued        1,250 mg 166.7 mL/hr over 90 Minutes Intravenous Every 48 hours 12/12/20 0902 12/13/20 0837   12/13/20 1600  metroNIDAZOLE (FLAGYL) tablet 500 mg        500 mg Oral Every 8 hours 12/13/20 0911     12/13/20 1000  ceFEPIme (MAXIPIME) 2 g in sodium chloride 0.9 % 100 mL IVPB        2 g 200 mL/hr over 30 Minutes Intravenous Every 12 hours 12/13/20 0837     12/13/20 0930  vancomycin (VANCOREADY) IVPB 750 mg/150 mL        750 mg 150 mL/hr over 60 Minutes Intravenous Every 24 hours 12/13/20 0837     12/12/20 1500  ceFEPIme (MAXIPIME) 2 g in sodium chloride 0.9 % 100 mL IVPB  Status:  Discontinued        2 g 200 mL/hr over 30 Minutes Intravenous Every 24 hours 12/11/20 1503 12/13/20 0837   12/11/20 1600  vancomycin (VANCOREADY) IVPB 1000 mg/200 mL  Status:  Discontinued        1,000 mg 200 mL/hr over 60 Minutes Intravenous  Once 12/11/20 1547 12/11/20 1553   12/11/20 1600  ceFEPIme (MAXIPIME) 2 g in sodium chloride 0.9 % 100 mL IVPB  Status:  Discontinued        2 g 200 mL/hr over 30 Minutes Intravenous  Once 12/11/20 1547 12/11/20 1553   12/11/20 1600  metroNIDAZOLE (FLAGYL) IVPB 500 mg  Status:  Discontinued        500 mg 100 mL/hr over 60 Minutes Intravenous Every 8 hours 12/11/20 1550 12/13/20 0911   12/11/20 1509  vancomycin variable dose per unstable renal function (pharmacist dosing)  Status:  Discontinued         Does not apply See admin instructions 12/11/20 1509 12/12/20 0902   12/11/20 1500  vancomycin (VANCOCIN) 2,500 mg in sodium chloride 0.9 % 500 mL IVPB        2,500 mg 250 mL/hr over 120 Minutes Intravenous  Once 12/11/20 1459 12/11/20 1919   12/11/20 1500  ceFEPIme (MAXIPIME) 2 g in sodium chloride  0.9 % 100 mL IVPB        2 g 200 mL/hr over 30 Minutes Intravenous  Once 12/11/20 1459 12/11/20 1718       Objective: Vitals:   12/13/20 0400 12/13/20 0600 12/13/20 0747 12/13/20 0848  BP: (!) 142/60 139/61 134/70   Pulse: 69 70 71 72  Resp: _0 Temp:   98.7 F (37.1 C)   TempSrc:   Oral   SpO2: 93% 93% 92% 90%  Weight:      Height:        Intake/Output Summary (Last 24  hours) at 12/13/2020 1146 Last data filed at 12/13/2020 0843 Gross per 24 hour  Intake 4293.17 ml  Output --  Net 4293.17 ml   Filed Weights   12/11/20 1145  Weight: 105.2 kg    Examination: General exam: Appears comfortable  HEENT: PERRLA, oral mucosa moist, no sclera icterus or thrush Respiratory system: Clear to auscultation. Respiratory effort normal. Cardiovascular system: S1 & S2 heard, regular rate and rhythm Gastrointestinal system: Abdomen soft, mildly tender, quite distended. Normal bowel sounds   Central nervous system: Alert and oriented. No focal neurological deficits. Extremities: No cyanosis, clubbing or edema Skin: No rashes or ulcers Psychiatry:  Mood & affect appropriate.    Data Reviewed: I have personally reviewed following labs and imaging studies  CBC: Recent Labs  Lab 12/11/20 0730 12/12/20 0419 12/13/20 0505  WBC 16.4* 13.0* 14.8*  NEUTROABS  --  12.7* 13.0*  HGB 11.4* 10.0* 9.8*  HCT 37.0* 31.6* 31.1*  MCV 83.3 84.0 83.4  PLT 422* 401* 564   Basic Metabolic Panel: Recent Labs  Lab 12/11/20 0730 12/11/20 1703 12/11/20 2045 12/12/20 0419 12/12/20 1442 12/13/20 0505 12/13/20 0726  NA 135  --  134* 136 137  --  142  K 2.5*  --  2.8* 2.7* 3.1*  --  3.0*  CL 86*  --  91* 94* 95*  --  100  CO2 26  --  _0 --  25  GLUCOSE 168*  --  125* 131* 187*  --  177*  BUN 138*  --  133* 128* 117*  --  106*  CREATININE 3.96*  --  3.56* 3.20* 2.97*  --  2.49*  CALCIUM 8.6*  --  7.8* 8.0* 8.2*  --  8.6*  MG  --  2.5*  --  2.6*  --  2.7*  --     GFR: Estimated Creatinine Clearance: 33.6 mL/min (A) (by C-G formula based on SCr of 2.49 mg/dL (H)). Liver Function Tests: Recent Labs  Lab 12/11/20 0730  AST 39  ALT 22  ALKPHOS 124  BILITOT 1.1  PROT 7.0  ALBUMIN 2.7*   Recent Labs  Lab 12/11/20 0730  LIPASE 58*   No results for input(s): AMMONIA in the last 168 hours. Coagulation Profile: Recent Labs  Lab 12/12/20 0419  INR 1.6*   Cardiac Enzymes: No results for input(s): CKTOTAL, CKMB, CKMBINDEX, TROPONINI in the last 168 hours. BNP (last 3 results) No results for input(s): PROBNP in the last 8760 hours. HbA1C: Recent Labs    12/11/20 2045  HGBA1C 6.8*   CBG: Recent Labs  Lab 12/12/20 1302 12/12/20 1940 12/12/20 2344 12/13/20 0346 12/13/20 0742  GLUCAP 144* 162* 173* 184* 152*   Lipid Profile: No results for input(s): CHOL, HDL, LDLCALC, TRIG, CHOLHDL, LDLDIRECT in the last 72 hours. Thyroid Function Tests: Recent Labs    12/11/20 1708  TSH 1.018   Anemia Panel: No results for input(s): VITAMINB12, FOLATE, FERRITIN, TIBC, IRON, RETICCTPCT in the last 72 hours. Urine analysis:    Component Value Date/Time   COLORURINE YELLOW 12/11/2020 1107   APPEARANCEUR HAZY (A) 12/11/2020 1107   LABSPEC 1.020 12/11/2020 1107   PHURINE 5.5 12/11/2020 1107   GLUCOSEU NEGATIVE 12/11/2020 1107   HGBUR NEGATIVE 12/11/2020 1107   BILIRUBINUR NEGATIVE 12/11/2020 1107   KETONESUR NEGATIVE 12/11/2020 1107   PROTEINUR NEGATIVE 12/11/2020 1107   UROBILINOGEN 0.2 10/26/2014 2206   NITRITE NEGATIVE 12/11/2020 1107   LEUKOCYTESUR NEGATIVE 12/11/2020 1107   Sepsis Labs: _1 (procalcitonin:4,lacticidven:4) )  Recent Results (from the past 240 hour(s))  Culture, blood (single)     Status: None (Preliminary result)   Collection Time: 12/11/20  2:58 PM   Specimen: BLOOD  Result Value Ref Range Status   Specimen Description BLOOD SITE NOT SPECIFIED  Final   Special Requests   Final    BOTTLES DRAWN AEROBIC  AND ANAEROBIC Blood Culture adequate volume   Culture   Final    NO GROWTH 2 DAYS Performed at [facility], [address], [place], [place] [address]    Report Status PENDING  Incomplete  MRSA PCR Screening     Status: None   Collection Time: 12/13/20  7:48 AM   Specimen: Nasal Mucosa; Nasopharyngeal  Result Value Ref Range Status   MRSA by PCR NEGATIVE NEGATIVE Final    Comment:        The GeneXpert MRSA Assay (FDA approved for NASAL specimens only), is one component of a comprehensive MRSA colonization surveillance program. It is not intended to diagnose MRSA infection nor to guide or monitor treatment for MRSA infections. Performed at [facility], [place] [address] [place], [place] [address]          Radiology Studies: CT Abdomen Pelvis Wo Contrast  Result Date: 12/11/2020 CLINICAL DATA:  Abdominal distension. Abscess or infection suspected. Nausea and vomiting EXAM: CT ABDOMEN AND PELVIS WITHOUT CONTRAST TECHNIQUE: Multidetector CT imaging of the abdomen and pelvis was performed following the standard protocol without IV contrast. COMPARISON:  CT 10/05/2020 CT 01/03/2019 FINDINGS: Lower chest: Dense consolidation LEFT lung base. Interlobular septal thickening at the RIGHT lung base. 7 mm nodule in the RIGHT lower lobe on image 7/4. Interval improvement in RIGHT pleural effusion. LEFT basilar consolidation is worse. Hepatobiliary: No focal hepatic lesion on noncontrast exam. Gallbladder is distended to 5.7 cm. No radiodense gallstones. No gallbladder inflammation. Intrahepatic duct dilatation on noncontrast exam Pancreas: Pancreas is normal. No ductal dilatation. No pancreatic inflammation. Spleen: Multiple splenules in the LEFT upper quadrant. Adrenals/urinary tract: Of LEFT adrenal gland thickening to 19 mm has attenuation greater than benign adenoma. No nephrolithiasis or ureterolithiasis Stomach/Bowel: Stomach, small bowel, appendix, and cecum are normal. Ascending  transverse colon normal. There is moderate volume stool in the descending colon. Large volume stool in the rectosigmoid colon. Stool ball in the rectum measuring 7 cm. Vascular/Lymphatic: Abdominal aorta is normal caliber with atherosclerotic calcification. There is no retroperitoneal or periportal lymphadenopathy. No pelvic lymphadenopathy. Reproductive: Prostate unremarkable Other: No intraperitoneal free air Musculoskeletal: LEFT hip prosthetic.  No acute osseous findings IMPRESSION: 1. Large volume stool in the rectosigmoid suggest constipation. 2. Dense consolidation in the LEFT lower lobe new from comparison exam. 3. Improvement in RIGHT pleural effusion. 4. RIGHT lobe pulmonary nodule. Recommend follow-up CT chest in 3 months. 5. Distended gallbladder without evidence acute cholecystitis. 6. Nodular thickening of the LEFT adrenal gland similar to CT 01/03/2019 Electronically Signed   By: Suzy Bouchard M.D.   On: 12/11/2020 14:28   CT Head Wo Contrast  Result Date: 12/11/2020 CLINICAL DATA:  Altered mental status. EXAM: CT HEAD WITHOUT CONTRAST TECHNIQUE: Contiguous axial images were obtained from the base of the skull through the vertex without intravenous contrast. COMPARISON:  Head CT scan 04/09/2019. FINDINGS: Brain: No evidence of acute infarction, hemorrhage, hydrocephalus, extra-axial collection or mass lesion/mass effect. Chronic microvascular ischemic change and cavum septum pellucidum et vergae noted. Vascular: No hyperdense vessel.  Atherosclerosis is seen. Skull: Intact.  No focal lesion. Sinuses/Orbits: Negative. Other:  None. IMPRESSION: No acute abnormality. Chronic microvascular ischemic change. Atherosclerosis. Electronically Signed   By: Inge Rise M.D.   On: 12/11/2020 14:00   US RENAL  Result Date: 12/11/2020 CLINICAL DATA:  Acute renal failure EXAM: RENAL / URINARY TRACT ULTRASOUND COMPLETE COMPARISON:  None. FINDINGS: Right Kidney: Renal measurements: 10.6 x 6.1 x 5.6 cm =  volume: 188 mL. Echogenicity within normal limits. No mass or hydronephrosis visualized. Left Kidney: Renal measurements: 10.9 x 5.3 x 4.8 cm = volume: 143 mL. Echogenicity within normal limits. No mass or hydronephrosis visualized. Bladder: Bladder decompressed and poorly assessed by sonography. Other: None. IMPRESSION: 1. Unremarkable sonographic appearance of the kidneys. 2. Decompressed urinary bladder poorly assessed by sonography. Electronically Signed   By: Lovena Le M.D.   On: 12/11/2020 18:44   DG Chest Port 1 View  Result Date: 12/11/2020 CLINICAL DATA:  Weakness EXAM: PORTABLE CHEST 1 VIEW COMPARISON:  11/21/2020 FINDINGS: Cardiac shadow is enlarged but stable. Aortic calcifications are again seen. Persistent left basilar airspace disease with associated effusion is noted. Some interval clearing in the right base is noted. Persistent central vascular congestion is seen. IMPRESSION: Persistent vascular congestion. Stable left basilar airspace opacity with a fusion although clearing in the right base is noted. Electronically Signed   By: Inez Catalina M.D.   On: 12/11/2020 12:02      Scheduled Meds: . amiodarone  200 mg Oral BID  . apixaban  5 mg Oral BID  . atorvastatin  40 mg Oral Daily  . insulin aspart  0-15 Units Subcutaneous Q4H  . lidocaine  1 patch Transdermal Q24H  . metroNIDAZOLE  500 mg Oral Q8H  . polyethylene glycol  17 g Oral Daily  . polyethylene glycol powder  1 Container Oral Once  . potassium chloride  40 mEq Oral Once  . potassium chloride  40 mEq Oral Q4H  . sodium chloride flush  3 mL Intravenous Q12H  . sorbitol, milk of mag, mineral oil, glycerin (SMOG) enema  960 mL Rectal Once  . tamsulosin  0.4 mg Oral QPC breakfast  . umeclidinium bromide  1 puff Inhalation Daily   Continuous Infusions: . ceFEPime (MAXIPIME) IV 2 g (12/13/20 0953)  . lactated ringers 125 mL/hr at 12/13/20 0704  . vancomycin 750 mg (12/13/20 1106)     LOS: 2 days      Debbe Odea, MD [facility] Pager: [email or web address] 12/13/2020, 11:46 AM

## 2020-12-13 NOTE — Progress Notes (Signed)
Pharmacy Antibiotic Note  Nicholas Caldwell is a 75 y.o. male admitted on 12/11/2020 with sepsis/PNA.  Pharmacy has been consulted for Vancomycin and Cefepime dosing. Also on Flagyl per MD. History of CKD stage 4 - Scr elevated 3.96 on presentation (baseline ~1.7).  Patient was loaded with vancomycin 2/8. With Scr improvement to 2.49 (CrCl 33) today, need to adjust cefepime and vancomycin dosing. Per [facility], planning to discontinue vancomycin if MRSA PCR negative - this still had not been collected this morning.   Plan: Cefepime 2 gm IV q12h Flagyl 529m PO q8h per MD Vancomycin 7548mIV q24h. Goal AUC 400-550, expected AUC 494, SCr used 2.49 Monitor renal function, cultures/sensitivities, clinical progression Vancomycin levels as indicated Reordered MRSA PCR    Height: 6' (182.9 cm) Weight: 105.2 kg (231 lb 14.8 oz) IBW/kg (Calculated) : 77.6  Temp (24hrs), Avg:98.7 F (37.1 C), Min:98.7 F (37.1 C), Max:98.7 F (37.1 C)  Recent Labs  Lab 12/11/20 0730 12/11/20 1650 12/11/20 2045 12/12/20 0419 12/12/20 1442 12/13/20 0505 12/13/20 0726  WBC 16.4*  --   --  13.0*  --  14.8*  --   CREATININE 3.96*  --  3.56* 3.20* 2.97*  --  2.49*  LATICACIDVEN  --  1.0  --   --   --   --   --     Estimated Creatinine Clearance: 33.6 mL/min (A) (by C-G formula based on SCr of 2.49 mg/dL (H)).    Allergies  Allergen Reactions  . Isosorb Dinitrate-Hydralazine Shortness Of Breath  . Benazepril Nausea And Vomiting  . Brimonidine Other (See Comments)    Dry eyes  . Metformin Other (See Comments)    Cramps and abdominal pain  . Morphine Rash    Antimicrobials this admission: Vancomycin 2/8 >>  Cefepime 2/8 >>  Flagyl 2/8 >>  Microbiology results: 2/8 BCx: pending 2/10 MRSA PCR: pending   MaRebbeca PaulPharmD PGY1 Pharmacy Resident 12/13/2020 9:13 AM  Please check [email or web address] for unit-specific pharmacy phone numbers.

## 2020-12-13 NOTE — ED Notes (Signed)
Breakfast Ordered

## 2020-12-13 NOTE — ED Notes (Addendum)
Call x2 to floor, according to note from 1100 yesterday, patient listed as progressive. At 1612 yesterday, level of care changed to medical telemetry. Floor wanting clarification before they will take patient.

## 2020-12-13 NOTE — Care Management (Signed)
Patient from home with home health through [facility]. Spoke with Ramond Marrow with [facility]. Patient active with them for RN PT OT aide. Will need resumption of care orders.   Magdalen Spatz RN

## 2020-12-13 NOTE — Progress Notes (Addendum)
Advanced Heart Failure Rounding Note  PCP-Cardiologist: No primary care provider on file.   Subjective:    SCr improving w/ IVFs, 3.20>>2.97>>2.49.   K 2.7>>3.0   PCT trending down w/ abx, 4.50>>1.52.  WBC 16>>14. AF   Feeling a bit better today but still w/ poor appetite  Continues w/ nonproductive Cough. O2 sats stable on 3L [place].   No further diarrhea.    Objective:   Weight Range: 105.2 kg Body mass index is 31.45 kg/m.   Vital Signs:   Temp:  [98.7 F (37.1 C)] 98.7 F (37.1 C) (02/10 0747) Pulse Rate:  [65-72] 72 (02/10 0848) Resp:  [15-26] 18 (02/10 0848) BP: (127-142)/(60-73) 134/70 (02/10 0747) SpO2:  [90 %-100 %] 90 % (02/10 0848) Last BM Date: 12/12/20  Weight change: Filed Weights   12/11/20 1145  Weight: 105.2 kg    Intake/Output:   Intake/Output Summary (Last 24 hours) at 12/13/2020 0947 Last data filed at 12/13/2020 0843 Gross per 24 hour  Intake 4293.17 ml  Output --  Net 4293.17 ml      Physical Exam    General:  Chronically ill appearing elderly male . No resp difficulty HEENT: Normal Neck: Supple. JVP 10-12 cm . Carotids 2+ bilat; no bruits. No lymphadenopathy or thyromegaly appreciated. Cor: PMI nondisplaced. Irregular rhythm and rate. No rubs, gallops or murmurs. Lungs: decreased BS bilaterally  Abdomen: Soft, nontender, nondistended. No hepatosplenomegaly. No bruits or masses. Good bowel sounds. Extremities: No cyanosis, clubbing, rash, edema Neuro: Alert & orientedx3, cranial nerves grossly intact. moves all 4 extremities w/o difficulty. Affect pleasant   Telemetry   Atrial flutter 70s   EKG    No new EKG performed   Labs    CBC Recent Labs    12/12/20 0419 12/13/20 0505  WBC 13.0* 14.8*  NEUTROABS 12.7* 13.0*  HGB 10.0* 9.8*  HCT 31.6* 31.1*  MCV 84.0 83.4  PLT 401* 622   Basic Metabolic Panel Recent Labs    12/12/20 0419 12/12/20 1442 12/13/20 0505 12/13/20 0726  NA 136 137  --  142  K 2.7* 3.1*   --  3.0*  CL 94* 95*  --  100  CO2 25 26  --  25  GLUCOSE 131* 187*  --  177*  BUN 128* 117*  --  106*  CREATININE 3.20* 2.97*  --  2.49*  CALCIUM 8.0* 8.2*  --  8.6*  MG 2.6*  --  2.7*  --    Liver Function Tests Recent Labs    12/11/20 0730  AST 39  ALT 22  ALKPHOS 124  BILITOT 1.1  PROT 7.0  ALBUMIN 2.7*   Recent Labs    12/11/20 0730  LIPASE 58*   Cardiac Enzymes No results for input(s): CKTOTAL, CKMB, CKMBINDEX, TROPONINI in the last 72 hours.  BNP: BNP (last 3 results) Recent Labs    10/29/20 2252 11/21/20 1944 12/11/20 0730  BNP 2,913.5* 2,475.4* 1,858.3*    ProBNP (last 3 results) No results for input(s): PROBNP in the last 8760 hours.   D-Dimer No results for input(s): DDIMER in the last 72 hours. Hemoglobin A1C Recent Labs    12/11/20 2045  HGBA1C 6.8*   Fasting Lipid Panel No results for input(s): CHOL, HDL, LDLCALC, TRIG, CHOLHDL, LDLDIRECT in the last 72 hours. Thyroid Function Tests Recent Labs    12/11/20 1708  TSH 1.018    Other results:   Imaging     No results found.   Medications:  Scheduled Medications: . amiodarone  200 mg Oral BID  . apixaban  5 mg Oral BID  . atorvastatin  40 mg Oral Daily  . insulin aspart  0-15 Units Subcutaneous Q4H  . lidocaine  1 patch Transdermal Q24H  . metroNIDAZOLE  500 mg Oral Q8H  . polyethylene glycol  17 g Oral Daily  . polyethylene glycol powder  1 Container Oral Once  . potassium chloride  40 mEq Oral Once  . potassium chloride  40 mEq Oral Q4H  . sodium chloride flush  3 mL Intravenous Q12H  . sorbitol, milk of mag, mineral oil, glycerin (SMOG) enema  960 mL Rectal Once  . tamsulosin  0.4 mg Oral QPC breakfast  . umeclidinium bromide  1 puff Inhalation Daily     Infusions: . ceFEPime (MAXIPIME) IV    . lactated ringers 125 mL/hr at 12/13/20 0704  . vancomycin       PRN Medications:  acetaminophen **OR** acetaminophen, albuterol, oxyCODONE-acetaminophen **AND**  oxyCODONE    Assessment/Plan   1. ID: L-sided PNA on CXR, PCT elevated 4.5 initially.  He was admitted with sepsis.  Currently afebrile.  - on cefepime, vancomycin, Flagyl per primary service. PCT trending down 2.Chronic diastolic CHFwith prominent RV failure: Likely related to COPD/OSA/OHS.H/o poor compliance w/ home O2 and BiPAP.Echo in 11/21 withEF 50-55%, RV severely reduced w/ severely elevated RV systolic pressure, 68 mmHg.Poor po intake recently with nausea/vomiting, now with AKI on CKD stage 3.  He has been getting IV fluid.  On exam, he does have mild JVD now.  -He was written for 500 cc total NS (completed) now off continous IVF hydration. - Will need to follow closely, has prominent RV failure with tendency towards volume overload/CHF.  - May need to resume home torsemide soon.  3. Chronic Hypoxic/Hypercarbic Respiratory failure: Due toCHF + COPD/OSA/OHS w/ poor compliance w/ home O2 and BiPAP. Now also with PNA.  -Continue daytime O2 and BiPAP during sleep. 4. AKI onStage3CKD: Baseline creatinine 1.8, up to 3.56 this admission, now down to 2.49 today  - Follow closely.   5.Atrial fibrillation/flutter:Paroxysmal. He has history of CVA. He had DCCV in 3/20 and again in 5/20. He failed amiodarone in the past and cannot take Tikosyn due to baseline prolonged QT interval. However, he has been back on amiodarone and was in NSR when I last saw him.  This admission, he is back in atrial flutter with controlled rate.  - Heparin gtt for now.   - Continue  amiodarone 200 mg bid (dose increased).  - When clinically improved, would consider TEE-guided DCCV to get him back in NSR.  6. COPD:No longer smokes. Continue homeoxygen.  - Now off Solumedrol.   7.OHS/OSA:Continue oxygen during the day andBIPAP at night.  8.CAD:Nonobstructive disease in 2/20. No chest pain.  - no ASA due to anticoagulation.  -Atorvastatin.  9. Hypokalemia: 3.0 today  - supp w/ KCl     Length of Stay: 2  Lyda Jester, PA-C  12/13/2020, 9:47 AM  Advanced Heart Failure Team Pager [phone] (M-F; 7a - 4p)  Please contact [facility] for night-coverage after hours (4p -7a ) and weekends on [email or web address]  Patient seen with PA, agree with the above note.   BUN/creatinine coming down but remains significantly elevated.  Now afebrile, WBCs 14.8, PCT trending down.  He is on the above noted abx.  Diuretics on hold.    Abdomen distended but denies significant pain.  No BM yet.   General: NAD Neck:  JVP 8-9 cm, no thyromegaly or thyroid nodule.  Lungs: Clear to auscultation bilaterally with normal respiratory effort. CV: Nondisplaced PMI.  Heart regular S1/S2, no S3/S4, 2/6 HSM LLSB.  No peripheral edema.   Abdomen: Soft, nontender, no hepatosplenomegaly, mild distention.  Skin: Intact without lesions or rashes.  Neurologic: Alert and oriented x 3.  Psych: Normal affect. Extremities: No clubbing or cyanosis.  HEENT: Normal.   Treatment of L-sided PNA per [facility] (vanco/cefepime/Flagyl).   Severe constipation likely related to chronic narcotic use.  Continue bowel regimen.   He is back in atrial flutter with controlled rate.  - Continue apixaban.  - Continue Eliquis.  - When he is stabilized, would be reasonable to do TEE-guided DCCV as he has been in NSR during recent admissions.   Suspect mild volume overload but BUN still 106.  He is not eating/drinking much with constipation.  Would continue to hold home torsemide, ?restart tomorrow.   Loralie Champagne 12/13/2020 12:16 PM

## 2020-12-14 DIAGNOSIS — R6520 Severe sepsis without septic shock: Secondary | ICD-10-CM | POA: Diagnosis not present

## 2020-12-14 DIAGNOSIS — I50810 Right heart failure, unspecified: Secondary | ICD-10-CM | POA: Diagnosis not present

## 2020-12-14 DIAGNOSIS — A419 Sepsis, unspecified organism: Secondary | ICD-10-CM | POA: Diagnosis not present

## 2020-12-14 LAB — CBC WITH DIFFERENTIAL/PLATELET
Abs Immature Granulocytes: 0.51 10*3/uL — ABNORMAL HIGH (ref 0.00–0.07)
Basophils Absolute: 0.1 10*3/uL (ref 0.0–0.1)
Basophils Relative: 0 %
Eosinophils Absolute: 0 10*3/uL (ref 0.0–0.5)
Eosinophils Relative: 0 %
HCT: 31.4 % — ABNORMAL LOW (ref 39.0–52.0)
Hemoglobin: 9.9 g/dL — ABNORMAL LOW (ref 13.0–17.0)
Immature Granulocytes: 3 %
Lymphocytes Relative: 2 %
Lymphs Abs: 0.4 10*3/uL — ABNORMAL LOW (ref 0.7–4.0)
MCH: 26.6 pg (ref 26.0–34.0)
MCHC: 31.5 g/dL (ref 30.0–36.0)
MCV: 84.4 fL (ref 80.0–100.0)
Monocytes Absolute: 3.5 10*3/uL — ABNORMAL HIGH (ref 0.1–1.0)
Monocytes Relative: 18 %
Neutro Abs: 14.3 10*3/uL — ABNORMAL HIGH (ref 1.7–7.7)
Neutrophils Relative %: 77 %
Platelets: 399 10*3/uL (ref 150–400)
RBC: 3.72 MIL/uL — ABNORMAL LOW (ref 4.22–5.81)
RDW: 19.4 % — ABNORMAL HIGH (ref 11.5–15.5)
WBC: 18.8 10*3/uL — ABNORMAL HIGH (ref 4.0–10.5)
nRBC: 0.3 % — ABNORMAL HIGH (ref 0.0–0.2)

## 2020-12-14 LAB — BASIC METABOLIC PANEL
Anion gap: 12 (ref 5–15)
BUN: 86 mg/dL — ABNORMAL HIGH (ref 8–23)
CO2: 24 mmol/L (ref 22–32)
Calcium: 8.7 mg/dL — ABNORMAL LOW (ref 8.9–10.3)
Chloride: 106 mmol/L (ref 98–111)
Creatinine, Ser: 2.07 mg/dL — ABNORMAL HIGH (ref 0.61–1.24)
GFR, Estimated: 33 mL/min — ABNORMAL LOW (ref >60)
Glucose, Bld: 117 mg/dL — ABNORMAL HIGH (ref 70–99)
Potassium: 3.5 mmol/L (ref 3.5–5.1)
Sodium: 142 mmol/L (ref 135–145)

## 2020-12-14 LAB — PATHOLOGIST SMEAR REVIEW

## 2020-12-14 LAB — GLUCOSE, CAPILLARY
Glucose-Capillary: 132 mg/dL — ABNORMAL HIGH (ref 70–99)
Glucose-Capillary: 106 mg/dL — ABNORMAL HIGH (ref 70–99)
Glucose-Capillary: 123 mg/dL — ABNORMAL HIGH (ref 70–99)
Glucose-Capillary: 165 mg/dL — ABNORMAL HIGH (ref 70–99)
Glucose-Capillary: 133 mg/dL — ABNORMAL HIGH (ref 70–99)

## 2020-12-14 LAB — MAGNESIUM: Magnesium: 2.6 mg/dL — ABNORMAL HIGH (ref 1.7–2.4)

## 2020-12-14 MED ORDER — POTASSIUM CHLORIDE CRYS ER 20 MEQ PO TBCR
40.0000 meq | EXTENDED_RELEASE_TABLET | ORAL | Status: AC
Start: 2020-12-14 — End: 2020-12-14
  Administered 2020-12-14 (×2): 40 meq via ORAL
  Filled 2020-12-14 (×2): qty 2

## 2020-12-14 MED ORDER — POTASSIUM CHLORIDE CRYS ER 20 MEQ PO TBCR
40.0000 meq | EXTENDED_RELEASE_TABLET | Freq: Every day | ORAL | Status: DC
  Administered 2020-12-15 – 2020-12-19 (×6): 40 meq via ORAL
  Filled 2020-12-14 (×6): qty 2

## 2020-12-14 MED ORDER — TORSEMIDE 20 MG PO TABS
80.0000 mg | ORAL_TABLET | Freq: Every day | ORAL | Status: DC
  Administered 2020-12-14 – 2020-12-19 (×6): 80 mg via ORAL
  Filled 2020-12-14 (×6): qty 4

## 2020-12-14 NOTE — TOC Initial Note (Signed)
Transition of Care [facility]) - Initial/Assessment Note    Patient Details  Name: Nicholas Caldwell MRN: [id number] Date of Birth: [birth year]  Transition of [facility]) CM/SW Contact:    Marilu Favre, RN Phone Number: 12/14/2020, 3:50 PM  Clinical Narrative:                 Spoke to patient and wife at bedside.   Confirmed face sheet information. Patient from home has [place] for [facility] and wants to continue. Orders given to [facility] with [facility] yesterday. Patient for procedure on Monday.   Patient has hospital bed , whell chair, scooter and 3 in1 at home already.   Patient also has home oxygen through [place]. Wife states [employer] was suppose to come pick up 7 tanks and drop off another 7 tanks. Wife plans to transport patient home at discharge and will need a tank. NCM called Zach with [place]. He will call wife to arrange.    Expected Discharge Plan: [place] Barriers to Discharge: Continued Medical Work up   Patient Goals and CMS Choice Patient states their goals for this hospitalization and ongoing recovery are:: to return to home CMS Medicare.gov Compare Post Acute Care list provided to:: Patient Choice offered to / list presented to : [facility]  Expected Discharge Plan and Services Expected Discharge Plan: [place] Choice: [place] arrangements for the past 2 months: Single Family Home                 DME Arranged: Oxygen DME Agency: [employer] Date DME Agency Contacted: 12/14/20 Time DME Agency Contacted: 50 Representative spoke with at DME Agency: [place]: PT,OT,RN,Nurse's [place]: [place] ([place]) Date [place]: 12/14/20 Time [place]: [place] Representative spoke with at [place]: Ramond Marrow  Prior Living Arrangements/Services Living arrangements for the past 2 months: [place] with::  Spouse Patient language and need for interpreter reviewed:: Yes Do you feel safe going back to the place where you live?: Yes      Need for Family Participation in Patient Care: Yes (Comment) Care giver support system in place?: Yes (comment)   Criminal Activity/Legal Involvement Pertinent to Current Situation/Hospitalization: No - Comment as needed  Activities of Daily Living      Permission Sought/Granted   Permission granted to share information with : Yes, Verbal Permission Granted  Share Information with NAME: [name] wife at bedside  Permission granted to share info w AGENCY: [place]        Emotional Assessment Appearance:: Appears stated age Attitude/Demeanor/Rapport: Engaged Affect (typically observed): Accepting Orientation: : Oriented to Self,Oriented to Place,Oriented to  Time,Oriented to Situation Alcohol / Substance Use: Not Applicable Psych Involvement: No (comment)  Admission diagnosis:  Hypokalemia [E87.6] AKI (acute kidney injury) ([place]) [N17.9] HCAP (healthcare-associated pneumonia) [J18.9] Severe sepsis ([place]) [A41.9, R65.20] Acute renal failure superimposed on stage 4 chronic kidney disease (HCC) [N17.9, N18.4] Acute congestive heart failure, unspecified heart failure type ([place]) [I50.9] Patient Active Problem List   Diagnosis Date Noted  . Severe sepsis ([place]) 12/11/2020  . Acute renal failure superimposed on stage 4 chronic kidney disease ([place]) 12/11/2020  . Abdominal pain 12/11/2020  . Stage III pressure ulcer of sacral region ([place]) 10/01/2020  . Acute systolic CHF (congestive heart failure) ([place]) 09/25/2020  . Acute respiratory failure with hypercapnia ([place])   .  Encounter for intubation   . Pressure injury of skin 08/21/2019  . Cardiac arrest ([place]) 08/20/2019  . Palliative care by specialist   . DNR (do not resuscitate) discussion   . Fatigue   . Acute CHF (congestive heart failure) ([place]) 07/07/2019  . Acute on chronic  heart failure ([place]) 06/09/2019  . Lobar pneumonia ([place]) 04/14/2019  . Hyperkalemia 04/10/2019  . Cerebral embolism with cerebral infarction 03/21/2019  . Gastritis and gastroduodenitis   . NSVT (nonsustained ventricular tachycardia) ([place]) 03/08/2019  . Tobacco abuse counseling 03/08/2019  . Iron deficiency anemia   . Acute on chronic systolic CHF (congestive heart failure) ([place]) 03/06/2019  . Diabetes mellitus type 2 in obese ([place])   . Primary osteoarthritis of right hip   . Hypomagnesemia   . Steroid-induced hyperglycemia   . Supplemental oxygen dependent   . Leukocytosis   . Hypokalemia   . Acute on chronic anemia   . Diabetic peripheral neuropathy ([place])   . Acute on chronic systolic (congestive) heart failure ([place])   . Acute on chronic respiratory failure ([place]) 01/14/2019  . Hypoxia   . Altered mental status   . RVF (right ventricular failure) ([place]) 12/30/2018  . AKI (acute kidney injury) ([place])   . Acute respiratory failure ([place]) 11/22/2018  . Acute on chronic respiratory failure with hypoxia and hypercapnia ([place]) 11/13/2018  . Metabolic encephalopathy 37/16/9678  . Acute respiratory failure with hypoxia and hypercapnia ([place]) 07/26/2018  . Acute exacerbation of CHF (congestive heart failure) ([place]) 07/26/2018  . CHF exacerbation ([place]) 05/01/2018  . CHF (congestive heart failure) ([place]) 04/30/2018  . Chronic respiratory failure with hypoxia ([place]) 12/28/2017  . Acute metabolic encephalopathy   . Atrial fibrillation with RVR ([place])   . SVT (supraventricular tachycardia) ([place])   . COPD GOLD 0   . Medically noncompliant   . Panlobular emphysema ([place])   . OSA (obstructive sleep apnea)   . Pulmonary hypertension ([place])   . Disorientation   . Pressure injury of skin 09/07/2016  . Acute on chronic respiratory failure with hypoxia ([place]) 09/05/2016  . Acute on chronic combined systolic and diastolic CHF (congestive heart failure) ([place]) 09/05/2016  . Skin lesion-left heal 09/05/2016   . Chronic systolic CHF (congestive heart failure) ([place]) 07/16/2016  . COPD (chronic obstructive pulmonary disease) ([place]) 07/16/2016  . Uncontrolled type 2 diabetes mellitus with complication ([place])   . Diabetic polyneuropathy associated with diabetes mellitus due to underlying condition ([place])   . Normocytic anemia 06/29/2016  . CAD - Non-obstructive by LHC1/16 01/21/2016  . Prolonged QT interval 10/09/2015  . Nonischemic cardiomyopathy ([place]) 10/09/2015  . PAF (paroxysmal atrial fibrillation) ([place])   . Chronic pain 02/19/2015  . Morbid obesity ([place]) 02/13/2015  . Dyspnea   . Elevated troponin I level 11/01/2014  . COPD exacerbation ([place]) 11/01/2014  . Essential hypertension 10/31/2014  . Type 2 diabetes mellitus with neuropathy 10/31/2014  . Hyperlipidemia  10/31/2014  . Cigarette smoker 10/31/2014   PCP:  [name], MD Pharmacy:   [facility] [place], [address] [place] [address] Phone: [phone] Fax: [place], [place] - [address] [place] [address] Phone: [phone] Fax: [phone]     Social Determinants of Health (SDOH) Interventions    Readmission Risk Interventions Readmission Risk Prevention Plan 11/27/2020 11/23/2020 08/15/2019  Transportation Screening - Complete Complete  PCP  or Specialist Appt within 3-5 Days Complete - -  HRI or Home Care Consult Complete - -  Social Work Consult for [place] Planning/Counseling Complete - -  Palliative Care Screening Not Applicable - -  Medication Review Press photographer) Complete Complete Not Complete  Med Review Comments - - requested discharge medication reconciliation by attending  PCP or Specialist appointment within 3-5 days of discharge - - Not Complete  PCP/Specialist Appt Not Complete comments - - [facility]  informed of admit and will contact pt directly regarding follow up appt  HRI or [place] - Complete Complete  SW Recovery Care/Counseling Consult - Complete -  [place] - Not Applicable -  Some recent data might be hidden

## 2020-12-14 NOTE — Progress Notes (Signed)
Patient ID: Nicholas Caldwell, male   DOB: 1946/09/17, 75 y.o.   MRN: [id number]     Advanced Heart Failure Rounding Note  PCP-Cardiologist: No primary care provider on file.   Subjective:    SCr improving w/ IVFs, 3.20>>2.97>>2.49>>2.07 and BUN trending down.   Patient remains afebrile with WBCs 18.8. He is on cefepime for PNA.    Feels better today.  No abdominal pain. Breathing improved.    Objective:   Weight Range: 104.3 kg Body mass index is 31.19 kg/m.   Vital Signs:   Temp:  [97.8 F (36.6 C)-98.2 F (36.8 C)] 97.9 F (36.6 C) (02/11 0415) Pulse Rate:  [71-73] 73 (02/11 0823) Resp:  [15-18] 18 (02/11 0823) BP: (118-131)/(59-69) 118/64 (02/11 0415) SpO2:  [90 %-95 %] 90 % (02/11 0823) Weight:  [104.3 kg] 104.3 kg (02/11 0408) Last BM Date: 12/13/20  Weight change: Filed Weights   12/11/20 1145 12/14/20 0408  Weight: 105.2 kg 104.3 kg    Intake/Output:   Intake/Output Summary (Last 24 hours) at 12/14/2020 1311 Last data filed at 12/14/2020 0659 Gross per 24 hour  Intake 3281.6 ml  Output 1775 ml  Net 1506.6 ml      Physical Exam    General: NAD Neck: JVP 8 cm, no thyromegaly or thyroid nodule.  Lungs: Bronchial breath sounds left base.  CV: Nondisplaced PMI.  Heart regular S1/S2, no S3/S4, no murmur.  No peripheral edema.   Abdomen: Soft, nontender, no hepatosplenomegaly, no distention.  Skin: Intact without lesions or rashes.  Neurologic: Alert and oriented x 3.  Psych: Normal affect. Extremities: No clubbing or cyanosis.  HEENT: Normal.    Telemetry   Atrial flutter 70s (personally reviewed)   Labs    CBC Recent Labs    12/13/20 0505 12/14/20 0021  WBC 14.8* 18.8*  NEUTROABS 13.0* 14.3*  HGB 9.8* 9.9*  HCT 31.1* 31.4*  MCV 83.4 84.4  PLT 394 741   Basic Metabolic Panel Recent Labs    12/13/20 0505 12/13/20 0726 12/13/20 1555 12/14/20 0021  NA  --    < > 141 142  K  --    < > 3.4* 3.5  CL  --    < > 102 106  CO2  --    < > 23  24  GLUCOSE  --    < > 155* 117*  BUN  --    < > 95* 86*  CREATININE  --    < > 2.30* 2.07*  CALCIUM  --    < > 8.7* 8.7*  MG 2.7*  --   --  2.6*   < > = values in this interval not displayed.   Liver Function Tests No results for input(s): AST, ALT, ALKPHOS, BILITOT, PROT, ALBUMIN in the last 72 hours. No results for input(s): LIPASE, AMYLASE in the last 72 hours. Cardiac Enzymes No results for input(s): CKTOTAL, CKMB, CKMBINDEX, TROPONINI in the last 72 hours.  BNP: BNP (last 3 results) Recent Labs    10/29/20 2252 11/21/20 1944 12/11/20 0730  BNP 2,913.5* 2,475.4* 1,858.3*    ProBNP (last 3 results) No results for input(s): PROBNP in the last 8760 hours.   D-Dimer No results for input(s): DDIMER in the last 72 hours. Hemoglobin A1C Recent Labs    12/11/20 2045  HGBA1C 6.8*   Fasting Lipid Panel No results for input(s): CHOL, HDL, LDLCALC, TRIG, CHOLHDL, LDLDIRECT in the last 72 hours. Thyroid Function Tests Recent Labs    12/11/20 1708  TSH 1.018    Other results:   Imaging    No results found.   Medications:     Scheduled Medications: . amiodarone  200 mg Oral BID  . apixaban  5 mg Oral BID  . atorvastatin  40 mg Oral Daily  . bisacodyl  10 mg Rectal Q6H  . insulin aspart  0-15 Units Subcutaneous Q4H  . lidocaine  1 patch Transdermal Q24H  . metroNIDAZOLE  500 mg Oral Q8H  . polyethylene glycol  17 g Oral Daily  . potassium chloride  40 mEq Oral Q4H  . sodium chloride flush  3 mL Intravenous Q12H  . tamsulosin  0.4 mg Oral QPC breakfast  . umeclidinium bromide  1 puff Inhalation Daily    Infusions: . ceFEPime (MAXIPIME) IV 2 g (12/14/20 1018)  . lactated ringers 125 mL/hr at 12/14/20 0659    PRN Medications: acetaminophen **OR** acetaminophen, albuterol, oxyCODONE-acetaminophen **AND** oxyCODONE    Assessment/Plan   1. ID: L-sided PNA on CXR, PCT elevated 4.5 initially.  He was admitted with sepsis.  Currently afebrile.  - On  cefepime per primary service.  2.Chronic diastolic CHFwith prominent RV failure: Likely related to COPD/OSA/OHS.H/o poor compliance w/ home O2 and BiPAP.Echo in 11/21 withEF 50-55%, RV severely reduced w/ severely elevated RV systolic pressure, 68 mmHg.Poor po intake recently with nausea/vomiting, initially with AKI on CKD stage 3. Creatinine back down to 2.07.  He is not particularly volume overloaded on exam.  He has prominent RV failure with tendency towards volume overload/CHF.  - On torsemide 80 mg bid at home, start torsemide 80 mg once daily today and follow response.  3. Chronic Hypoxic/Hypercarbic Respiratory failure: Due toCHF + COPD/OSA/OHS w/ poor compliance w/ home O2 and BiPAP. Now also with PNA.  -Continue daytime O2 and BiPAP during sleep. 4. AKI onStage3CKD: Baseline creatinine 1.8, up to 3.56 this admission, now down to 2.07 today  - Follow closely.   5.Atrial fibrillation/flutter:Paroxysmal. He has history of CVA. He had DCCV in 3/20 and again in 5/20. He failed amiodarone in the past and cannot take Tikosyn due to baseline prolonged QT interval. However, he has been back on amiodarone and was in NSR when I last saw him.  This admission, he is back in atrial flutter with controlled rate.  - Continue Eliquis.  - Continue  amiodarone 200 mg bid (dose increased).  - Will plan for TEE-guided DCCV to get him back in NSR, arrange for Monday.  Discussed risks/benefits with patient and he agrees to procedure.  6. COPD:No longer smokes. Continue homeoxygen.  - Now off Solumedrol.   7.OHS/OSA:Continue oxygen during the day andBIPAP at night.  8.CAD:Nonobstructive disease in 2/20. No chest pain.  - no ASA due to anticoagulation.  -Atorvastatin.  9. Hypokalemia: Resolved.   Length of Stay: 3  Loralie Champagne, MD  12/14/2020, 1:11 PM  Advanced Heart Failure Team Pager [phone] (M-F; 7a - 4p)  Please contact [facility] for night-coverage after hours (4p  -7a ) and weekends on [email or web address]

## 2020-12-14 NOTE — Progress Notes (Signed)
Remains in A fib.  Set up TEE/DC/CV on 12/17/20 at 11:30 by Dr [name].   Pollie Poma NP-C  1:19 PM

## 2020-12-14 NOTE — Evaluation (Signed)
Physical Therapy Evaluation Patient Details Name: Nicholas Caldwell MRN: [id number] DOB: 04-Dec-1945 Today's Date: 12/14/2020   History of Present Illness  Pt is a 75 y/o male admitted secondary to Abdominal pain and severe abdominal distention Secondary to constipation due to chronic use of narcotics. Pt also found to have severe sepsis secondary to L sided PNA. PMH includes CHF, CAD, a fib, COPD, and DM.  Clinical Impression  Pt admitted secondary to problem above with deficits below. Pt slow to move secondary to pain. Getting somewhat agitated with PT this session as he felt he was being rushed. Pt spending extended time at EOB and able to laterally scoot along EOB. Pt refusing SNF and reports he plans to return home with wife. If he is unable to progress with standing, may need to consider lift equipment at d/c. Will continue to follow acutely.     Follow Up Recommendations Home health PT;Supervision/Assistance - 24 hour (Pt refusing SNF)    Equipment Recommendations  Other (comment) (TBD pending progression)    Recommendations for Other Services       Precautions / Restrictions Precautions Precautions: Fall Restrictions Weight Bearing Restrictions: No      Mobility  Bed Mobility Overal bed mobility: Needs Assistance Bed Mobility: Supine to Sit;Sit to Supine;Rolling Rolling: Supervision   Supine to sit: Mod assist Sit to supine: Mod assist   General bed mobility comments: Mod A for trunk and LE assist throughout. Pt slow to move and requiring extended time to perform. Did not want to do more that bed mobility tasks this session. Spent extended time at EOB. Was able to laterally scoot towards [facility] with extended time.    Transfers                    Ambulation/Gait                Stairs            Wheelchair Mobility    Modified Rankin (Stroke Patients Only)       Balance Overall balance assessment: Needs assistance Sitting-balance support: No  upper extremity supported Sitting balance-Leahy Scale: Fair                                       Pertinent Vitals/Pain Pain Assessment: Faces Faces Pain Scale: Hurts even more Pain Location: abdomen and back Pain Descriptors / Indicators: Aching Pain Intervention(s): Limited activity within patient's tolerance;Monitored during session;Repositioned    Home Living Family/patient expects to be discharged to:: Private residence Living Arrangements: Spouse/significant other Available Help at Discharge: Family;Available 24 hours/day Type of Home: House Home Access: Other (comment) (threshold) Entrance Stairs-Rails: None   Home Layout: One level Home Equipment: Walker - 2 wheels;Wheelchair - manual;Bedside commode;Grab bars - tub/shower;Shower seat;Electric scooter;Walker - 4 wheels      Prior Function Level of Independence: Needs assistance   Gait / Transfers Assistance Needed: Has been able to stand and transfer to [place] using RW.  ADL's / Homemaking Assistance Needed: Spouse assists with LB ADLs        Hand Dominance        Extremity/Trunk Assessment   Upper Extremity Assessment Upper Extremity Assessment: Defer to OT evaluation    Lower Extremity Assessment Lower Extremity Assessment: Generalized weakness    Cervical / Trunk Assessment Cervical / Trunk Assessment: Kyphotic  Communication   Communication: No difficulties  Cognition Arousal/Alertness: Awake/alert  Behavior During Therapy: WFL for tasks assessed/performed Overall Cognitive Status: History of cognitive impairments - at baseline                                 General Comments: Pt getting agitated during mobility when he felt like PT was "rushing him". Apologized at end of session.      General Comments      Exercises     Assessment/Plan    PT Assessment Patient needs continued PT services  PT Problem List Decreased strength;Decreased mobility;Decreased safety  awareness;Decreased range of motion;Decreased knowledge of precautions;Decreased activity tolerance;Cardiopulmonary status limiting activity;Decreased balance;Decreased knowledge of use of DME;Pain;Decreased cognition       PT Treatment Interventions DME instruction;Therapeutic activities;Gait training;Patient/family education;Therapeutic exercise;Balance training;Functional mobility training    PT Goals (Current goals can be found in the Care Plan section)  Acute Rehab PT Goals Patient Stated Goal: to go home PT Goal Formulation: With patient Time For Goal Achievement: 12/28/20 Potential to Achieve Goals: Fair    Frequency Min 3X/week   Barriers to discharge        Co-evaluation               AM-PAC PT "6 Clicks" Mobility  Outcome Measure Help needed turning from your back to your side while in a flat bed without using bedrails?: A Little Help needed moving from lying on your back to sitting on the side of a flat bed without using bedrails?: A Lot Help needed moving to and from a bed to a chair (including a wheelchair)?: A Lot Help needed standing up from a chair using your arms (e.g., wheelchair or bedside chair)?: A Lot Help needed to walk in hospital room?: Total Help needed climbing 3-5 steps with a railing? : Total 6 Click Score: 11    End of Session Equipment Utilized During Treatment: Oxygen Activity Tolerance: Patient tolerated treatment well Patient left: in bed;with call bell/phone within reach;with bed alarm set Nurse Communication: Mobility status PT Visit Diagnosis: Unsteadiness on feet (R26.81);Muscle weakness (generalized) (M62.81);Difficulty in walking, not elsewhere classified (R26.2)    Time: 9774-1423 PT Time Calculation (min) (ACUTE ONLY): 44 min   Charges:   PT Evaluation $PT Eval Moderate Complexity: 1 Mod PT Treatments $Therapeutic Activity: 23-37 mins        Lou Miner, DPT  Acute Rehabilitation Services  Pager: [phone] Office: [phone]   Rudean Hitt 12/14/2020, 3:33 PM

## 2020-12-14 NOTE — Progress Notes (Signed)
PROGRESS NOTE    Nicholas Caldwell   PQA:[id number]  DOB: 07-12-1946  DOA: 12/11/2020 PCP: [name], MD   Brief Narrative:  Nicholas Caldwell is a 75 yo male with PMH chronic dCHF, CKD4, afib (on Eliquis), COPD (on chronic 3-4 L O2), OHS/OSA, HTN, HLD, pulmonary HTN, DMII who presented to the ER with abdominal distention/bloating and pain that had been going on for approximately 2 weeks.  He also had associated nausea/vomiting.  He was very lethargic in the ER but able to provide some information.  He endorsed no chest pain or worsening shortness of breath despite imaging findings and only endorsed a mild nonproductive cough. He states he is on diuretics at home but states he was not taking them recently; he has also had poor appetite.  In the ED: Chest x-ray reveals large left-sided consolidation concerning for pneumonia. CT of the abdomen pelvis reveals a large stool burden with a 7 cm stool ball in the rectum. BUN 138, creatinine 3.96, potassium 2.5 WBC count 16.4 Covid negative  The patient received vancomycin and cefepime in the ED and was admitted to the hospital. Patient was hospitalized this past January and released on 11/27/2020 after being treated for acute on chronic heart failure.   Subjective: The patient was seen and examined this morning, hemodynamically stable, Stable on 3 L of oxygen satting 90% Tolerating p.o. Reporting of 2 bowel movement since last night  Assessment & Plan:   Principal Problem:   Severe sepsis -Secondary to left sided pneumonia  -improving -He was recently hospitalized, he is being treated as hospital-acquired infection -WBC 16.4  >>> 18.8 today -Procalcitonin 4.50-has improved to 3.22 >>> 1.52 - MRSA PCR is negative- stop Vanc -Blood Cultures --- NGTD -Steroid has been discontinued , since no signs of COPD exacerbation  -We will continue current antibiotics of cefepime, Flagyl   Active Problems: Acute renal failure superimposed on stage 4  chronic kidney disease (HCC) -Creatinine improving 2.49, 2.30, 2.07 this a.m. -Likely due to poor p.o. intake -Responding to IV fluid -Continue to hold diuretics and continue to follow creatinine -Appreciate cardiology involvement  Abdominal pain and severe abdominal distention -Secondary to constipation due to chronic use of narcotics, -Initially did not respond to MiraLAX and Dulcolax, soapsuds enema and SMOG enemas were ineffective  -Responded to give tap water enema and start a container of Miralax (colon prep) -The patient should be on stool softeners/laxatives as long as he is continued on narcotics  Hypokalemia  -likely secondary to diuretics -Potassium 3.4, 3.5 this a.m. -Monitoring repleting accordingly -Magnesium level is 2.6 and does not require replacement  Chronic pain -He states his pain is chronically present in his back and his hips -Home dose of oxycodone/acetaminophen 10 mg every 6 hours as needed has been resumed -The patient and his wife has been informed regarding his chronic narcotic use which is related to his constipation    PAF (paroxysmal atrial fibrillation)  -Continue amiodarone and Eliquis    COPD (chronic obstructive pulmonary disease) (HCC)   Chronic respiratory failure with hypoxia  -Per patient is on 3-3.5 L of oxygen at home -Currently is comfortable on 3 L of oxygen via nasal cannula satting 90%    Diabetes mellitus type 2 in obese (Nicholas Caldwell) -Patient takes 70/30 at home -He is currently on a sliding scale  BPH -Continue Flomax  Right lower lobe pulmonary nodule -Will need outpatient follow-up   Time spent in minutes: 35 minutes DVT prophylaxis: Heparin/Eliquis Code Status: Full code Family  Communication: Wife at the bedside Level of Care: Level of care: Telemetry Medical Disposition Plan:  Status is: Inpatient  Remains inpatient appropriate because:IV treatments appropriate due to intensity of illness or inability to take  PO   Dispo: The patient is from: Home              Anticipated d/c is to: Home              Anticipated d/c date is: 3 days              Patient currently is not medically stable to d/c.   Difficult to place patient No      Consultants:   Cardiology Procedures:   None Antimicrobials:  Anti-infectives (From admission, onward)   Start     Dose/Rate Route Frequency Ordered Stop   12/13/20 1730  vancomycin (VANCOREADY) IVPB 1250 mg/250 mL  Status:  Discontinued        1,250 mg 166.7 mL/hr over 90 Minutes Intravenous Every 48 hours 12/12/20 0902 12/13/20 0837   12/13/20 1600  metroNIDAZOLE (FLAGYL) tablet 500 mg        500 mg Oral Every 8 hours 12/13/20 0911     12/13/20 1000  ceFEPIme (MAXIPIME) 2 g in sodium chloride 0.9 % 100 mL IVPB        2 g 200 mL/hr over 30 Minutes Intravenous Every 12 hours 12/13/20 0837     12/13/20 0930  vancomycin (VANCOREADY) IVPB 750 mg/150 mL  Status:  Discontinued        750 mg 150 mL/hr over 60 Minutes Intravenous Every 24 hours 12/13/20 0837 12/13/20 1203   12/12/20 1500  ceFEPIme (MAXIPIME) 2 g in sodium chloride 0.9 % 100 mL IVPB  Status:  Discontinued        2 g 200 mL/hr over 30 Minutes Intravenous Every 24 hours 12/11/20 1503 12/13/20 0837   12/11/20 1600  vancomycin (VANCOREADY) IVPB 1000 mg/200 mL  Status:  Discontinued        1,000 mg 200 mL/hr over 60 Minutes Intravenous  Once 12/11/20 1547 12/11/20 1553   12/11/20 1600  ceFEPIme (MAXIPIME) 2 g in sodium chloride 0.9 % 100 mL IVPB  Status:  Discontinued        2 g 200 mL/hr over 30 Minutes Intravenous  Once 12/11/20 1547 12/11/20 1553   12/11/20 1600  metroNIDAZOLE (FLAGYL) IVPB 500 mg  Status:  Discontinued        500 mg 100 mL/hr over 60 Minutes Intravenous Every 8 hours 12/11/20 1550 12/13/20 0911   12/11/20 1509  vancomycin variable dose per unstable renal function (pharmacist dosing)  Status:  Discontinued         Does not apply See admin instructions 12/11/20 1509 12/12/20  0902   12/11/20 1500  vancomycin (VANCOCIN) 2,500 mg in sodium chloride 0.9 % 500 mL IVPB        2,500 mg 250 mL/hr over 120 Minutes Intravenous  Once 12/11/20 1459 12/11/20 1919   12/11/20 1500  ceFEPIme (MAXIPIME) 2 g in sodium chloride 0.9 % 100 mL IVPB        2 g 200 mL/hr over 30 Minutes Intravenous  Once 12/11/20 1459 12/11/20 1718       Objective: Vitals:   12/14/20 0253 12/14/20 0408 12/14/20 0415 12/14/20 0823  BP: (!) 118/59  118/64   Pulse: 71  72 73  Resp: _0 Temp: 98.2 F (36.8 C)  97.9 F (36.6 C)  TempSrc: Oral  Oral   SpO2: 93%  95% 90%  Weight:  104.3 kg    Height:        Intake/Output Summary (Last 24 hours) at 12/14/2020 1239 Last data filed at 12/14/2020 0659 Gross per 24 hour  Intake 3281.6 ml  Output 1775 ml  Net 1506.6 ml   Filed Weights   12/11/20 1145 12/14/20 0408  Weight: 105.2 kg 104.3 kg       Physical Exam:   General:  Alert, oriented, cooperative, no distress;   HEENT:  Normocephalic, PERRL, otherwise with in Normal limits   Neuro:  CNII-XII intact. , normal motor and sensation, reflexes intact   Lungs:   Clear to auscultation BL, Respirations unlabored, no wheezes / crackles  Cardio:    S1/S2, RRR, No murmure, No Rubs or Gallops   Abdomen:   Soft, non-tender, bowel sounds active all four quadrants,  no guarding or peritoneal signs.  Muscular skeletal:   Generalized weaknesses, Limited exam - in bed, able to move all 4 extremities, Normal strength,  2+ pulses,  symmetric, No pitting edema  Skin:  Dry, warm to touch, negative for any Rashes,  Wounds: Please see nursing documentation Pressure Injury 11/22/20 Sacrum Right Stage 2 -  Partial thickness loss of dermis presenting as a shallow open injury with a red, pink wound bed without slough. 3x1 cm (Active)  11/22/20 0336  Location: Sacrum  Location Orientation: Right  Staging: Stage 2 -  Partial thickness loss of dermis presenting as a shallow open injury with a red, pink  wound bed without slough.  Wound Description (Comments): 3x1 cm  Present on Admission: Yes           Data Reviewed: I have personally reviewed following labs and imaging studies  CBC: Recent Labs  Lab 12/11/20 0730 12/12/20 0419 12/13/20 0505 12/14/20 0021  WBC 16.4* 13.0* 14.8* 18.8*  NEUTROABS  --  12.7* 13.0* 14.3*  HGB 11.4* 10.0* 9.8* 9.9*  HCT 37.0* 31.6* 31.1* 31.4*  MCV 83.3 84.0 83.4 84.4  PLT 422* 401* 394 597   Basic Metabolic Panel: Recent Labs  Lab 12/11/20 1703 12/11/20 2045 12/12/20 0419 12/12/20 1442 12/13/20 0505 12/13/20 0726 12/13/20 1555 12/14/20 0021  NA  --    < > 136 137  --  142 141 142  K  --    < > 2.7* 3.1*  --  3.0* 3.4* 3.5  CL  --    < > 94* 95*  --  100 102 106  CO2  --    < > 25 26  --  _0 GLUCOSE  --    < > 131* 187*  --  177* 155* 117*  BUN  --    < > 128* 117*  --  106* 95* 86*  CREATININE  --    < > 3.20* 2.97*  --  2.49* 2.30* 2.07*  CALCIUM  --    < > 8.0* 8.2*  --  8.6* 8.7* 8.7*  MG 2.5*  --  2.6*  --  2.7*  --   --  2.6*   < > = values in this interval not displayed.   GFR: Estimated Creatinine Clearance: 40.3 mL/min (A) (by C-G formula based on SCr of 2.07 mg/dL (H)). Liver Function Tests: Recent Labs  Lab 12/11/20 0730  AST 39  ALT 22  ALKPHOS 124  BILITOT 1.1  PROT 7.0  ALBUMIN 2.7*   Recent Labs  Lab 12/11/20  0730  LIPASE 58*   No results for input(s): AMMONIA in the last 168 hours. Coagulation Profile: Recent Labs  Lab 12/12/20 0419  INR 1.6*   Cardiac Enzymes: No results for input(s): CKTOTAL, CKMB, CKMBINDEX, TROPONINI in the last 168 hours. BNP (last 3 results) No results for input(s): PROBNP in the last 8760 hours. HbA1C: Recent Labs    12/11/20 2045  HGBA1C 6.8*   CBG: Recent Labs  Lab 12/13/20 2029 12/13/20 2328 12/14/20 0405 12/14/20 0755 12/14/20 1228  GLUCAP 160* 117* 132* 106* 123*   Lipid Profile: No results for input(s): CHOL, HDL, LDLCALC, TRIG, CHOLHDL,  LDLDIRECT in the last 72 hours. Thyroid Function Tests: Recent Labs    12/11/20 1708  TSH 1.018   Anemia Panel: No results for input(s): VITAMINB12, FOLATE, FERRITIN, TIBC, IRON, RETICCTPCT in the last 72 hours. Urine analysis:    Component Value Date/Time   COLORURINE YELLOW 12/11/2020 1107   APPEARANCEUR HAZY (A) 12/11/2020 1107   LABSPEC 1.020 12/11/2020 1107   PHURINE 5.5 12/11/2020 1107   GLUCOSEU NEGATIVE 12/11/2020 1107   HGBUR NEGATIVE 12/11/2020 1107   BILIRUBINUR NEGATIVE 12/11/2020 1107   KETONESUR NEGATIVE 12/11/2020 1107   PROTEINUR NEGATIVE 12/11/2020 1107   UROBILINOGEN 0.2 10/26/2014 2206   NITRITE NEGATIVE 12/11/2020 1107   LEUKOCYTESUR NEGATIVE 12/11/2020 1107   Sepsis Labs: _0 (procalcitonin:4,lacticidven:4) ) Recent Results (from the past 240 hour(s))  Culture, blood (single)     Status: None (Preliminary result)   Collection Time: 12/11/20  2:58 PM   Specimen: BLOOD  Result Value Ref Range Status   Specimen Description BLOOD SITE NOT SPECIFIED  Final   Special Requests   Final    BOTTLES DRAWN AEROBIC AND ANAEROBIC Blood Culture adequate volume   Culture   Final    NO GROWTH 3 DAYS Performed at [facility], [place] [address] [place], [place] [address]    Report Status PENDING  Incomplete  MRSA PCR Screening     Status: None   Collection Time: 12/13/20  7:48 AM   Specimen: Nasal Mucosa; Nasopharyngeal  Result Value Ref Range Status   MRSA by PCR NEGATIVE NEGATIVE Final    Comment:        The GeneXpert MRSA Assay (FDA approved for NASAL specimens only), is one component of a comprehensive MRSA colonization surveillance program. It is not intended to diagnose MRSA infection nor to guide or monitor treatment for MRSA infections. Performed at [facility], [place] [address] [place], [place] [address]          Radiology Studies: No results found.    Scheduled Meds: . amiodarone  200 mg Oral BID  . apixaban  5 mg  Oral BID  . atorvastatin  40 mg Oral Daily  . bisacodyl  10 mg Rectal Q6H  . insulin aspart  0-15 Units Subcutaneous Q4H  . lidocaine  1 patch Transdermal Q24H  . metroNIDAZOLE  500 mg Oral Q8H  . polyethylene glycol  17 g Oral Daily  . potassium chloride  40 mEq Oral Q4H  . sodium chloride flush  3 mL Intravenous Q12H  . tamsulosin  0.4 mg Oral QPC breakfast  . umeclidinium bromide  1 puff Inhalation Daily   Continuous Infusions: . ceFEPime (MAXIPIME) IV 2 g (12/14/20 1018)  . lactated ringers 125 mL/hr at 12/14/20 0659     LOS: 3 days      Deatra Uzziel, MD [facility] Pager: [email or web address] 12/14/2020, 12:39 PM

## 2020-12-15 DIAGNOSIS — A419 Sepsis, unspecified organism: Secondary | ICD-10-CM | POA: Diagnosis not present

## 2020-12-15 DIAGNOSIS — R6520 Severe sepsis without septic shock: Secondary | ICD-10-CM | POA: Diagnosis not present

## 2020-12-15 LAB — GLUCOSE, CAPILLARY
Glucose-Capillary: 106 mg/dL — ABNORMAL HIGH (ref 70–99)
Glucose-Capillary: 126 mg/dL — ABNORMAL HIGH (ref 70–99)
Glucose-Capillary: 128 mg/dL — ABNORMAL HIGH (ref 70–99)
Glucose-Capillary: 133 mg/dL — ABNORMAL HIGH (ref 70–99)
Glucose-Capillary: 162 mg/dL — ABNORMAL HIGH (ref 70–99)
Glucose-Capillary: 163 mg/dL — ABNORMAL HIGH (ref 70–99)

## 2020-12-15 LAB — MAGNESIUM: Magnesium: 2.2 mg/dL (ref 1.7–2.4)

## 2020-12-15 LAB — BASIC METABOLIC PANEL
Anion gap: 11 (ref 5–15)
BUN: 63 mg/dL — ABNORMAL HIGH (ref 8–23)
CO2: 27 mmol/L (ref 22–32)
Calcium: 8.6 mg/dL — ABNORMAL LOW (ref 8.9–10.3)
Chloride: 106 mmol/L (ref 98–111)
Creatinine, Ser: 1.9 mg/dL — ABNORMAL HIGH (ref 0.61–1.24)
GFR, Estimated: 37 mL/min — ABNORMAL LOW (ref >60)
Glucose, Bld: 181 mg/dL — ABNORMAL HIGH (ref 70–99)
Potassium: 3.3 mmol/L — ABNORMAL LOW (ref 3.5–5.1)
Sodium: 144 mmol/L (ref 135–145)

## 2020-12-15 LAB — CBC WITH DIFFERENTIAL/PLATELET
Abs Immature Granulocytes: 1.4 10*3/uL — ABNORMAL HIGH (ref 0.00–0.07)
Basophils Absolute: 0 10*3/uL (ref 0.0–0.1)
Basophils Relative: 0 %
Eosinophils Absolute: 0.1 10*3/uL (ref 0.0–0.5)
Eosinophils Relative: 1 %
HCT: 31 % — ABNORMAL LOW (ref 39.0–52.0)
Hemoglobin: 9.6 g/dL — ABNORMAL LOW (ref 13.0–17.0)
Immature Granulocytes: 12 %
Lymphocytes Relative: 5 %
Lymphs Abs: 0.6 10*3/uL — ABNORMAL LOW (ref 0.7–4.0)
MCH: 25.9 pg — ABNORMAL LOW (ref 26.0–34.0)
MCHC: 31 g/dL (ref 30.0–36.0)
MCV: 83.8 fL (ref 80.0–100.0)
Monocytes Absolute: 2.2 10*3/uL — ABNORMAL HIGH (ref 0.1–1.0)
Monocytes Relative: 19 %
Neutro Abs: 7.5 10*3/uL (ref 1.7–7.7)
Neutrophils Relative %: 63 %
Platelets: 384 10*3/uL (ref 150–400)
RBC: 3.7 MIL/uL — ABNORMAL LOW (ref 4.22–5.81)
RDW: 19.4 % — ABNORMAL HIGH (ref 11.5–15.5)
WBC: 11.8 10*3/uL — ABNORMAL HIGH (ref 4.0–10.5)
nRBC: 1 % — ABNORMAL HIGH (ref 0.0–0.2)

## 2020-12-15 MED ORDER — INSULIN ASPART 100 UNIT/ML ~~LOC~~ SOLN
0.0000 [IU] | Freq: Every day | SUBCUTANEOUS | Status: DC
  Administered 2020-12-16: 3 [IU] via SUBCUTANEOUS

## 2020-12-15 MED ORDER — POTASSIUM CHLORIDE CRYS ER 20 MEQ PO TBCR
40.0000 meq | EXTENDED_RELEASE_TABLET | Freq: Two times a day (BID) | ORAL | Status: AC
  Administered 2020-12-15 (×2): 40 meq via ORAL
  Filled 2020-12-15 (×2): qty 2

## 2020-12-15 MED ORDER — INSULIN ASPART 100 UNIT/ML ~~LOC~~ SOLN
0.0000 [IU] | Freq: Three times a day (TID) | SUBCUTANEOUS | Status: DC
  Administered 2020-12-16 – 2020-12-19 (×5): 2 [IU] via SUBCUTANEOUS

## 2020-12-15 NOTE — Progress Notes (Signed)
PROGRESS NOTE    Nicholas Caldwell   GYJ:[id number]  DOB: 04-15-1946  DOA: 12/11/2020 PCP: [name], MD   Brief Narrative:  Nicholas Caldwell is a 75 yo male with PMH chronic dCHF, CKD4, afib (on Eliquis), COPD (on chronic 3-4 L O2), OHS/OSA, HTN, HLD, pulmonary HTN, DMII who presented to the ER with abdominal distention/bloating and pain that had been going on for approximately 2 weeks.  He also had associated nausea/vomiting.  He was very lethargic in the ER but able to provide some information.  He endorsed no chest pain or worsening shortness of breath despite imaging findings and only endorsed a mild nonproductive cough. He states he is on diuretics at home but states he was not taking them recently; he has also had poor appetite.  In the ED: Chest x-ray reveals large left-sided consolidation concerning for pneumonia. CT of the abdomen pelvis reveals a large stool burden with a 7 cm stool ball in the rectum. BUN 138, creatinine 3.96, potassium 2.5 WBC count 16.4 Covid negative  The patient received vancomycin and cefepime in the ED and was admitted to the hospital. Patient was hospitalized this past January and released on 11/27/2020 after being treated for acute on chronic heart failure.   Subjective: The patient was seen and examined stable this morning Nursing staff wife present at bedside Stable on 3 L of oxygen satting 90% Reporting improved abdominal pain bowel movements x2  Assessment & Plan:   Principal Problem:   Severe sepsis -Secondary to left sided pneumonia  -improving -He was recently hospitalized, he is being treated as hospital-acquired infection -WBC 16.4  >>> 18.8 >> 11.8today -Procalcitonin 4.50-has improved to 3.22 >>> 1.52 - MRSA PCR is negative- stop Vanc -Blood Cultures --- reviewed, NGTD -Steroid has been discontinued , since no signs of COPD exacerbation  -Signs and symptoms of sepsis has resolved patient remained stable  We will continue cefepime,  Flagyl   Active Problems:  Acute renal failure superimposed on stage 4 chronic kidney disease (HCC) -Creatinine improving 2.49, 2.30, 2.07 >>1.90 today. -Likely due to poor p.o. intake -Responding to IV fluid>>> discontinued  12/15/20 -Cardiology resumed diuretics -Appreciate cardiology involvement  Abdominal pain and severe abdominal distention -Improved with current bowel regimen's, positive bowel sounds bowel movements -Secondary to constipation due to chronic use of narcotics, -Initially did not respond to MiraLAX and Dulcolax, soapsuds enema and SMOG enemas were ineffective  -Responded to give tap water enema and start a container of Miralax (colon prep) -The patient should be on stool softeners/laxatives as long as he is continued on narcotics  Hypokalemia  -likely secondary to diuretics -Potassium 3.4, 3.5>> 3.3  t today -Monitoring repleting accordingly -Magnesium level is 2.6 and does not require replacement  Chronic pain -He states his pain is chronically present in his back and his hips -Home dose of oxycodone/acetaminophen 10 mg every 6 hours as needed has been resumed -The patient and his wife has been informed regarding his chronic narcotic use which is related to his constipation    PAF (paroxysmal atrial fibrillation)  -Continue amiodarone and Eliquis    COPD (chronic obstructive pulmonary disease) (HCC)   Chronic respiratory failure with hypoxia  -Per patient is on 3-3.5 L of oxygen at home --Stable on 3 L of oxygen satting 94%    Diabetes mellitus type 2 in obese ([place]) -Patient takes 70/30 at home -He is currently on a sliding scale  BPH -Continue Flomax  Right lower lobe pulmonary nodule -Will need outpatient  follow-up  Chronicdiastolic CHFwith prominent RV failure:  In the setting of COPD/OSA/OHS. H/o poor compliance w/ home O2 and BiPAP. Echo in 11/21 withEF 50-55% -Cardiology heart failure team following -Resumed torsemide 80 mg p.o.  daily (reduced from twice daily) -Monitoring daily weight    Time spent in minutes: 35 minutes DVT prophylaxis: Heparin/Eliquis Code Status: Full code Family Communication: Discussed with patient and his wife at bedside  level of Care: Level of care: Telemetry Medical Disposition Plan:  Status is: Inpatient  Remains inpatient appropriate because:IV treatments appropriate due to intensity of illness or inability to take PO   Dispo: The patient is from: Home              Anticipated d/c is to: Home              Anticipated d/c date is: in 1-2 days               Patient currently is not medically stable to d/c.   Difficult to place patient No      Consultants:   Cardiology Procedures:   None Antimicrobials:  Anti-infectives (From admission, onward)   Start     Dose/Rate Route Frequency Ordered Stop   12/13/20 1730  vancomycin (VANCOREADY) IVPB 1250 mg/250 mL  Status:  Discontinued        1,250 mg 166.7 mL/hr over 90 Minutes Intravenous Every 48 hours 12/12/20 0902 12/13/20 0837   12/13/20 1600  metroNIDAZOLE (FLAGYL) tablet 500 mg        500 mg Oral Every 8 hours 12/13/20 0911     12/13/20 1000  ceFEPIme (MAXIPIME) 2 g in sodium chloride 0.9 % 100 mL IVPB        2 g 200 mL/hr over 30 Minutes Intravenous Every 12 hours 12/13/20 0837     12/13/20 0930  vancomycin (VANCOREADY) IVPB 750 mg/150 mL  Status:  Discontinued        750 mg 150 mL/hr over 60 Minutes Intravenous Every 24 hours 12/13/20 0837 12/13/20 1203   12/12/20 1500  ceFEPIme (MAXIPIME) 2 g in sodium chloride 0.9 % 100 mL IVPB  Status:  Discontinued        2 g 200 mL/hr over 30 Minutes Intravenous Every 24 hours 12/11/20 1503 12/13/20 0837   12/11/20 1600  vancomycin (VANCOREADY) IVPB 1000 mg/200 mL  Status:  Discontinued        1,000 mg 200 mL/hr over 60 Minutes Intravenous  Once 12/11/20 1547 12/11/20 1553   12/11/20 1600  ceFEPIme (MAXIPIME) 2 g in sodium chloride 0.9 % 100 mL IVPB  Status:  Discontinued         2 g 200 mL/hr over 30 Minutes Intravenous  Once 12/11/20 1547 12/11/20 1553   12/11/20 1600  metroNIDAZOLE (FLAGYL) IVPB 500 mg  Status:  Discontinued        500 mg 100 mL/hr over 60 Minutes Intravenous Every 8 hours 12/11/20 1550 12/13/20 0911   12/11/20 1509  vancomycin variable dose per unstable renal function (pharmacist dosing)  Status:  Discontinued         Does not apply See admin instructions 12/11/20 1509 12/12/20 0902   12/11/20 1500  vancomycin (VANCOCIN) 2,500 mg in sodium chloride 0.9 % 500 mL IVPB        2,500 mg 250 mL/hr over 120 Minutes Intravenous  Once 12/11/20 1459 12/11/20 1919   12/11/20 1500  ceFEPIme (MAXIPIME) 2 g in sodium chloride 0.9 % 100 mL IVPB  2 g 200 mL/hr over 30 Minutes Intravenous  Once 12/11/20 1459 12/11/20 1718       Objective: Vitals:   12/14/20 1520 12/14/20 2012 12/15/20 0400 12/15/20 0820  BP: 112/60 123/74 113/68   Pulse: 74 75 75   Resp: _0 Temp: 98.8 F (37.1 C) 99.3 F (37.4 C) 98.2 F (36.8 C)   TempSrc: Oral Oral Oral   SpO2: 94% 94% 94% 94%  Weight:      Height:        Intake/Output Summary (Last 24 hours) at 12/15/2020 1035 Last data filed at 12/15/2020 0754 Gross per 24 hour  Intake 1030 ml  Output 3700 ml  Net -2670 ml   Filed Weights   12/11/20 1145 12/14/20 0408  Weight: 105.2 kg 104.3 kg       Physical Exam:   General:  Alert, oriented, cooperative, no distress;   HEENT:  Normocephalic, PERRL, otherwise with in Normal limits   Neuro:  CNII-XII intact. , normal motor and sensation, reflexes intact   Lungs:   Clear to auscultation BL, Respirations unlabored, no wheezes / crackles  Cardio:    S1/S2, RRR, No murmure, No Rubs or Gallops   Abdomen:   Soft, non-tender, bowel sounds active all four quadrants,  no guarding or peritoneal signs.  Muscular skeletal:  Limited exam - in bed, able to move all 4 extremities, Normal strength,  2+ pulses,  symmetric, No pitting edema  Skin:  Dry, warm to  touch, negative for any Rashes,  Wounds: Please see nursing documentation Pressure Injury 11/22/20 Sacrum Right Stage 2 -  Partial thickness loss of dermis presenting as a shallow open injury with a red, pink wound bed without slough. 3x1 cm (Active)  11/22/20 0336  Location: Sacrum  Location Orientation: Right  Staging: Stage 2 -  Partial thickness loss of dermis presenting as a shallow open injury with a red, pink wound bed without slough.  Wound Description (Comments): 3x1 cm  Present on Admission: Yes               Data Reviewed: I have personally reviewed following labs and imaging studies  CBC: Recent Labs  Lab 12/11/20 0730 12/12/20 0419 12/13/20 0505 12/14/20 0021 12/15/20 0115  WBC 16.4* 13.0* 14.8* 18.8* 11.8*  NEUTROABS  --  12.7* 13.0* 14.3* 7.5  HGB 11.4* 10.0* 9.8* 9.9* 9.6*  HCT 37.0* 31.6* 31.1* 31.4* 31.0*  MCV 83.3 84.0 83.4 84.4 83.8  PLT 422* 401* 394 399 859   Basic Metabolic Panel: Recent Labs  Lab 12/11/20 1703 12/11/20 2045 12/12/20 0419 12/12/20 1442 12/13/20 0505 12/13/20 0726 12/13/20 1555 12/14/20 0021 12/15/20 0115  NA  --    < > 136 137  --  142 141 142 144  K  --    < > 2.7* 3.1*  --  3.0* 3.4* 3.5 3.3*  CL  --    < > 94* 95*  --  100 102 106 106  CO2  --    < > 25 26  --  _1 GLUCOSE  --    < > 131* 187*  --  177* 155* 117* 181*  BUN  --    < > 128* 117*  --  106* 95* 86* 63*  CREATININE  --    < > 3.20* 2.97*  --  2.49* 2.30* 2.07* 1.90*  CALCIUM  --    < > 8.0* 8.2*  --  8.6* 8.7*  8.7* 8.6*  MG 2.5*  --  2.6*  --  2.7*  --   --  2.6* 2.2   < > = values in this interval not displayed.   GFR: Estimated Creatinine Clearance: 43.9 mL/min (A) (by C-G formula based on SCr of 1.9 mg/dL (H)). Liver Function Tests: Recent Labs  Lab 12/11/20 0730  AST 39  ALT 22  ALKPHOS 124  BILITOT 1.1  PROT 7.0  ALBUMIN 2.7*   Recent Labs  Lab 12/11/20 0730  LIPASE 58*   No results for input(s): AMMONIA in the last 168  hours. Coagulation Profile: Recent Labs  Lab 12/12/20 0419  INR 1.6*   Cardiac Enzymes: No results for input(s): CKTOTAL, CKMB, CKMBINDEX, TROPONINI in the last 168 hours. BNP (last 3 results) No results for input(s): PROBNP in the last 8760 hours. HbA1C: No results for input(s): HGBA1C in the last 72 hours. CBG: Recent Labs  Lab 12/14/20 1705 12/14/20 2009 12/15/20 0014 12/15/20 0419 12/15/20 0748  GLUCAP 165* 133* 126* 163* 133*   Lipid Profile: No results for input(s): CHOL, HDL, LDLCALC, TRIG, CHOLHDL, LDLDIRECT in the last 72 hours. Thyroid Function Tests: No results for input(s): TSH, T4TOTAL, FREET4, T3FREE, THYROIDAB in the last 72 hours. Anemia Panel: No results for input(s): VITAMINB12, FOLATE, FERRITIN, TIBC, IRON, RETICCTPCT in the last 72 hours. Urine analysis:    Component Value Date/Time   COLORURINE YELLOW 12/11/2020 1107   APPEARANCEUR HAZY (A) 12/11/2020 1107   LABSPEC 1.020 12/11/2020 1107   PHURINE 5.5 12/11/2020 1107   GLUCOSEU NEGATIVE 12/11/2020 1107   HGBUR NEGATIVE 12/11/2020 1107   BILIRUBINUR NEGATIVE 12/11/2020 1107   KETONESUR NEGATIVE 12/11/2020 1107   PROTEINUR NEGATIVE 12/11/2020 1107   UROBILINOGEN 0.2 10/26/2014 2206   NITRITE NEGATIVE 12/11/2020 1107   LEUKOCYTESUR NEGATIVE 12/11/2020 1107   Sepsis Labs: _0 (procalcitonin:4,lacticidven:4) ) Recent Results (from the past 240 hour(s))  Culture, blood (single)     Status: None (Preliminary result)   Collection Time: 12/11/20  2:58 PM   Specimen: BLOOD  Result Value Ref Range Status   Specimen Description BLOOD SITE NOT SPECIFIED  Final   Special Requests   Final    BOTTLES DRAWN AEROBIC AND ANAEROBIC Blood Culture adequate volume   Culture   Final    NO GROWTH 3 DAYS Performed at [facility], [place] [address] [place], [place] [address]    Report Status PENDING  Incomplete  MRSA PCR Screening     Status: None   Collection Time: 12/13/20  7:48 AM   Specimen:  Nasal Mucosa; Nasopharyngeal  Result Value Ref Range Status   MRSA by PCR NEGATIVE NEGATIVE Final    Comment:        The GeneXpert MRSA Assay (FDA approved for NASAL specimens only), is one component of a comprehensive MRSA colonization surveillance program. It is not intended to diagnose MRSA infection nor to guide or monitor treatment for MRSA infections. Performed at [facility], [place] [address], [place], [place] [address]          Radiology Studies: No results found.    Scheduled Meds: . amiodarone  200 mg Oral BID  . apixaban  5 mg Oral BID  . atorvastatin  40 mg Oral Daily  . insulin aspart  0-15 Units Subcutaneous Q4H  . lidocaine  1 patch Transdermal Q24H  . metroNIDAZOLE  500 mg Oral Q8H  . polyethylene glycol  17 g Oral Daily  . potassium chloride  40 mEq  Oral Daily  . sodium chloride flush  3 mL Intravenous Q12H  . tamsulosin  0.4 mg Oral QPC breakfast  . torsemide  80 mg Oral Daily  . umeclidinium bromide  1 puff Inhalation Daily   Continuous Infusions: . ceFEPime (MAXIPIME) IV 2 g (12/15/20 0927)     LOS: 4 days      Deatra Quadry, MD [facility] Pager: [email or web address] 12/15/2020, 10:35 AM

## 2020-12-15 NOTE — Progress Notes (Addendum)
Patient ID: Nicholas Caldwell, male   DOB: June 28, 1946, 75 y.o.   MRN: [id number]     Advanced Heart Failure Rounding Note  PCP-Cardiologist: No primary care provider on file.   Subjective:    SCr improving w/ IVFs, 3.20>>2.97>>2.49>>2.07>> 1.90  Patient remains afebrile with WBCs 18.8 -> 11.8. He is on cefepime for PNA.    Feels good. Denies SOB, orthopnea or PND   Torsemide restarted yesterday    Objective:   Weight Range: 104.3 kg Body mass index is 31.19 kg/m.   Vital Signs:   Temp:  [98.2 F (36.8 C)-99.3 F (37.4 C)] 98.2 F (36.8 C) (02/12 0400) Pulse Rate:  [74-75] 75 (02/12 0400) Resp:  [17-19] 18 (02/12 0400) BP: (112-123)/(60-74) 113/68 (02/12 0400) SpO2:  [94 %] 94 % (02/12 0820) Last BM Date: 12/14/20  Weight change: Filed Weights   12/11/20 1145 12/14/20 0408  Weight: 105.2 kg 104.3 kg    Intake/Output:   Intake/Output Summary (Last 24 hours) at 12/15/2020 1132 Last data filed at 12/15/2020 1030 Gross per 24 hour  Intake 1510 ml  Output 4125 ml  Net -2615 ml      Physical Exam   General:  Lying flat in bed  No resp difficulty HEENT: normal Neck: supple. JVP 7-8 Carotids 2+ bilat; no bruits. No lymphadenopathy or thryomegaly appreciated. Cor: PMI nondisplaced. Irregular rate & rhythm. No rubs, gallops or murmurs. Lungs: clear Abdomen: obese soft, nontender, nondistended. No hepatosplenomegaly. No bruits or masses. Good bowel sounds. Extremities: no cyanosis, clubbing, rash, edema Neuro: alert & orientedx3, cranial nerves grossly intact. moves all 4 extremities w/o difficulty. Affect pleasant  Telemetry   Atrial flutter 70-80s Personally reviewed  Labs    CBC Recent Labs    12/14/20 0021 12/15/20 0115  WBC 18.8* 11.8*  NEUTROABS 14.3* 7.5  HGB 9.9* 9.6*  HCT 31.4* 31.0*  MCV 84.4 83.8  PLT 399 832   Basic Metabolic Panel Recent Labs    12/14/20 0021 12/15/20 0115  NA 142 144  K 3.5 3.3*  CL 106 106  CO2 24 27  GLUCOSE 117*  181*  BUN 86* 63*  CREATININE 2.07* 1.90*  CALCIUM 8.7* 8.6*  MG 2.6* 2.2   Liver Function Tests No results for input(s): AST, ALT, ALKPHOS, BILITOT, PROT, ALBUMIN in the last 72 hours. No results for input(s): LIPASE, AMYLASE in the last 72 hours. Cardiac Enzymes No results for input(s): CKTOTAL, CKMB, CKMBINDEX, TROPONINI in the last 72 hours.  BNP: BNP (last 3 results) Recent Labs    10/29/20 2252 11/21/20 1944 12/11/20 0730  BNP 2,913.5* 2,475.4* 1,858.3*    ProBNP (last 3 results) No results for input(s): PROBNP in the last 8760 hours.   D-Dimer No results for input(s): DDIMER in the last 72 hours. Hemoglobin A1C No results for input(s): HGBA1C in the last 72 hours. Fasting Lipid Panel No results for input(s): CHOL, HDL, LDLCALC, TRIG, CHOLHDL, LDLDIRECT in the last 72 hours. Thyroid Function Tests No results for input(s): TSH, T4TOTAL, T3FREE, THYROIDAB in the last 72 hours.  Invalid input(s): FREET3  Other results:   Imaging    No results found.   Medications:     Scheduled Medications: . amiodarone  200 mg Oral BID  . apixaban  5 mg Oral BID  . atorvastatin  40 mg Oral Daily  . insulin aspart  0-15 Units Subcutaneous Q4H  . lidocaine  1 patch Transdermal Q24H  . metroNIDAZOLE  500 mg Oral Q8H  . polyethylene glycol  17 g Oral Daily  . potassium chloride  40 mEq Oral Daily  . sodium chloride flush  3 mL Intravenous Q12H  . tamsulosin  0.4 mg Oral QPC breakfast  . torsemide  80 mg Oral Daily  . umeclidinium bromide  1 puff Inhalation Daily    Infusions: . ceFEPime (MAXIPIME) IV 2 g (12/15/20 0927)    PRN Medications: acetaminophen **OR** acetaminophen, albuterol, oxyCODONE-acetaminophen **AND** oxyCODONE    Assessment/Plan   1. ID: L-sided PNA on CXR, PCT elevated 4.5 initially.  He was admitted with sepsis.  Currently afebrile.  - On cefepime per primary service.  - Improving 2.Chronic diastolic CHFwith prominent RV failure:  Likely related to COPD/OSA/OHS.H/o poor compliance w/ home O2 and BiPAP.Echo in 11/21 withEF 50-55%, RV severely reduced w/ severely elevated RV systolic pressure, 68 mmHg.Poor po intake recently with nausea/vomiting, initially with AKI on CKD stage 3. Creatinine back down to 2.07.  He is not particularly volume overloaded on exam.  He has prominent RV failure with tendency towards volume overload/CHF.  - On torsemide 80 mg bid at home. Now on torsmide 80 daily. Volume status ok. Will continue. Can increase prior to d/c - supp K  3. Chronic Hypoxic/Hypercarbic Respiratory failure: Due toCHF + COPD/OSA/OHS w/ poor compliance w/ home O2 and BiPAP. Now also with PNA.  -Continue daytime O2 and BiPAP during sleep. 4. AKI onStage3CKD: Baseline creatinine 1.8, up to 3.56 this admission, now down to 1.90 today  - Follow closely.   5.Atrial fibrillation/flutter:Paroxysmal. He has history of CVA. He had DCCV in 3/20 and again in 5/20. He failed amiodarone in the past and cannot take Tikosyn due to baseline prolonged QT interval. However, he has been back on amiodarone and was in NSR when I last saw him.  This admission, he is back in atrial flutter with controlled rate.  - Continue Eliquis.  - Continue  amiodarone 200 mg bid (dose increased).  - Will plan for TEE-guided DCCV to get him back in NSR, arranged for Monday.  Discussed risks/benefits with patient and he agrees to procedure.  6. COPD:No longer smokes. Continue homeoxygen.  - Now off Solumedrol.   7.OHS/OSA:Continue oxygen during the day andBIPAP at night.  8.CAD:Nonobstructive disease in 2/20. No chest pain.  - no ASA due to anticoagulation.  -Atorvastatin.  9. Hypokalemia: Resolved.   Continue current therapy. We will see gain on Monday unless called over the weekend.   Length of Stay: [employer], MD  12/15/2020, 11:32 AM  Advanced Heart Failure Team Pager [phone] (M-F; 7a - 4p)  Please contact [facility] for night-coverage after hours (4p -7a ) and weekends on [email or web address]

## 2020-12-16 DIAGNOSIS — A419 Sepsis, unspecified organism: Principal | ICD-10-CM

## 2020-12-16 DIAGNOSIS — R6520 Severe sepsis without septic shock: Secondary | ICD-10-CM

## 2020-12-16 LAB — BASIC METABOLIC PANEL
Anion gap: 10 (ref 5–15)
BUN: 49 mg/dL — ABNORMAL HIGH (ref 8–23)
CO2: 30 mmol/L (ref 22–32)
Calcium: 8.6 mg/dL — ABNORMAL LOW (ref 8.9–10.3)
Chloride: 102 mmol/L (ref 98–111)
Creatinine, Ser: 1.93 mg/dL — ABNORMAL HIGH (ref 0.61–1.24)
GFR, Estimated: 36 mL/min — ABNORMAL LOW (ref >60)
Glucose, Bld: 147 mg/dL — ABNORMAL HIGH (ref 70–99)
Potassium: 3.4 mmol/L — ABNORMAL LOW (ref 3.5–5.1)
Sodium: 142 mmol/L (ref 135–145)

## 2020-12-16 LAB — CULTURE, BLOOD (SINGLE)
Culture: NO GROWTH
Special Requests: ADEQUATE

## 2020-12-16 LAB — CBC WITH DIFFERENTIAL/PLATELET
Abs Immature Granulocytes: 1.64 10*3/uL — ABNORMAL HIGH (ref 0.00–0.07)
Basophils Absolute: 0 10*3/uL (ref 0.0–0.1)
Basophils Relative: 0 %
Eosinophils Absolute: 0.1 10*3/uL (ref 0.0–0.5)
Eosinophils Relative: 1 %
HCT: 31.8 % — ABNORMAL LOW (ref 39.0–52.0)
Hemoglobin: 9.9 g/dL — ABNORMAL LOW (ref 13.0–17.0)
Immature Granulocytes: 13 %
Lymphocytes Relative: 8 %
Lymphs Abs: 0.9 10*3/uL (ref 0.7–4.0)
MCH: 25.6 pg — ABNORMAL LOW (ref 26.0–34.0)
MCHC: 31.1 g/dL (ref 30.0–36.0)
MCV: 82.4 fL (ref 80.0–100.0)
Monocytes Absolute: 1.9 10*3/uL — ABNORMAL HIGH (ref 0.1–1.0)
Monocytes Relative: 15 %
Neutro Abs: 7.9 10*3/uL — ABNORMAL HIGH (ref 1.7–7.7)
Neutrophils Relative %: 63 %
Platelets: 425 10*3/uL — ABNORMAL HIGH (ref 150–400)
RBC: 3.86 MIL/uL — ABNORMAL LOW (ref 4.22–5.81)
RDW: 19.3 % — ABNORMAL HIGH (ref 11.5–15.5)
WBC: 12.5 10*3/uL — ABNORMAL HIGH (ref 4.0–10.5)
nRBC: 1.4 % — ABNORMAL HIGH (ref 0.0–0.2)

## 2020-12-16 LAB — GLUCOSE, CAPILLARY
Glucose-Capillary: 139 mg/dL — ABNORMAL HIGH (ref 70–99)
Glucose-Capillary: 122 mg/dL — ABNORMAL HIGH (ref 70–99)
Glucose-Capillary: 110 mg/dL — ABNORMAL HIGH (ref 70–99)
Glucose-Capillary: 265 mg/dL — ABNORMAL HIGH (ref 70–99)

## 2020-12-16 LAB — MAGNESIUM: Magnesium: 1.8 mg/dL (ref 1.7–2.4)

## 2020-12-16 MED ORDER — MAGNESIUM SULFATE 2 GM/50ML IV SOLN
2.0000 g | Freq: Once | INTRAVENOUS | Status: AC
  Administered 2020-12-16: 2 g via INTRAVENOUS
  Filled 2020-12-16: qty 50

## 2020-12-16 MED ORDER — POTASSIUM CHLORIDE CRYS ER 20 MEQ PO TBCR
40.0000 meq | EXTENDED_RELEASE_TABLET | Freq: Once | ORAL | Status: AC
  Administered 2020-12-16: 40 meq via ORAL
  Filled 2020-12-16: qty 2

## 2020-12-16 NOTE — Progress Notes (Signed)
Pharmacy Antibiotic Note  Nicholas Caldwell is a 75 y.o. male admitted on 12/11/2020 with sepsis/PNA.  Pharmacy has been consulted for Cefepime dosing. Also on Flagyl per MD.   Day 5 abx. Patient is afebrile, WBC trending down to 12.5. SCr stable at base. Blood culture NGTD.  Plan: Cefepime 2 gm IV q12h Flagyl 535m PO q8h per MD F/u stop date on 2/14 if clinically stable   Height: 6' (182.9 cm) Weight: 98.2 kg (216 lb 7.9 oz) IBW/kg (Calculated) : 77.6  Temp (24hrs), Avg:99.3 F (37.4 C), Min:99.1 F (37.3 C), Max:99.4 F (37.4 C)  Recent Labs  Lab 12/11/20 1650 12/11/20 2045 12/12/20 0419 12/12/20 1442 12/13/20 0505 12/13/20 0726 12/13/20 1555 12/14/20 0021 12/15/20 0115 12/16/20 0118  WBC  --   --  13.0*  --  14.8*  --   --  18.8* 11.8* 12.5*  CREATININE  --    < > 3.20*   < >  --  2.49* 2.30* 2.07* 1.90* 1.93*  LATICACIDVEN 1.0  --   --   --   --   --   --   --   --   --    < > = values in this interval not displayed.    Estimated Creatinine Clearance: 42 mL/min (A) (by C-G formula based on SCr of 1.93 mg/dL (H)).    Allergies  Allergen Reactions  . Isosorb Dinitrate-Hydralazine Shortness Of Breath  . Benazepril Nausea And Vomiting  . Brimonidine Other (See Comments)    Dry eyes  . Metformin Other (See Comments)    Cramps and abdominal pain  . Morphine Rash    Antimicrobials this admission: Vancomycin 2/8 >> 2/10 Cefepime 2/8 >>  Flagyl 2/8 >>  Microbiology results: 2/8 BCx: NGTD 2/10 MRSA PCR: negative  YBerenice Bouton PharmD, BCPS PGY2 Pharmacy Resident Phone between 7 am - 3:30 pm: [phone] Please check AMION for all [place]phone numbers After 10:00 PM, call [phone] 12/16/2020 12:05 PM

## 2020-12-16 NOTE — Progress Notes (Signed)
PROGRESS NOTE    Nicholas Caldwell   LOV:[id number]  DOB: 09-09-1946  DOA: 12/11/2020 PCP: [name], MD   Brief Narrative:  Nicholas Caldwell is a 75 yo male with PMH chronic dCHF, CKD4, afib (on Eliquis), COPD (on chronic 3-4 L O2), OHS/OSA, HTN, HLD, pulmonary HTN, DMII who presented to the ER with abdominal distention/bloating and pain that had been going on for approximately 2 weeks.  He also had associated nausea/vomiting.  He was very lethargic in the ER but able to provide some information.  He endorsed no chest pain or worsening shortness of breath despite imaging findings and only endorsed a mild nonproductive cough. He states he is on diuretics at home but states he was not taking them recently; he has also had poor appetite.  In the ED: Chest x-ray reveals large left-sided consolidation concerning for pneumonia. CT of the abdomen pelvis reveals a large stool burden with a 7 cm stool ball in the rectum. BUN 138, creatinine 3.96, potassium 2.5 WBC count 16.4 Covid negative  The patient received vancomycin and cefepime in the ED and was admitted to the hospital. Patient was hospitalized this past January and released on 11/27/2020 after being treated for acute on chronic heart failure.   Subjective:  The patient was seen and examined this morning, remained stable afebrile normotensive no acute distress no issues overnight Satting 95% on 3 L of oxygen Mildly hypotensive 108/62 Denies any chest pain or shortness of breath  Assessment & Plan:   Principal Problem:   Severe sepsis -Secondary to left sided pneumonia  -improving -He was recently hospitalized, he is being treated as hospital-acquired infection -WBC 16.4  >>> 18.8 >> 11.8t >> 12.5 today -Procalcitonin 4.50-has improved to 3.22 >>> 1.52 - MRSA PCR is negative- stop Vanc -Blood Cultures --- reviewed, NGTD -Steroid has been discontinued , since no signs of COPD exacerbation  -Signs and symptoms of sepsis has resolved  patient remained stable  We will continue cefepime, Flagyl >> may be discontinued tomorrow after 5 days    Active Problems:  Acute renal failure superimposed on stage 4 chronic kidney disease (HCC) -Creatinine improving 2.49, 2.30, 2.07 >>1.90 >> 1.93 today. -Likely due to poor p.o. intake -Responding to IV fluid>>> discontinued  12/15/20 -Cardiology resumed diuretics -Appreciate cardiology involvement  Abdominal pain and severe abdominal distention -Much improved positive bowel movements -Secondary to constipation due to chronic use of narcotics, -Initially did not respond to MiraLAX and Dulcolax, soapsuds enema and SMOG enemas were ineffective  -Responded to give tap water enema and start a container of Miralax (colon prep) -The patient should be on stool softeners/laxatives as long as he is continued on narcotics  Hypokalemia  -likely secondary to diuretics -Potassium 3.4, 3.5>> 3.3   >>3.4 today -Monitoring repleting accordingly -Magnesium level is 2.6 and does not require replacement  Chronic pain -He states his pain is chronically present in his back and his hips -Home dose of oxycodone/acetaminophen 10 mg every 6 hours as needed has been resumed -The patient and his wife has been informed regarding his chronic narcotic use which is related to his constipation    PAF (paroxysmal atrial fibrillation)  -Continue amiodarone and Eliquis    COPD (chronic obstructive pulmonary disease) (HCC)   Chronic respiratory failure with hypoxia  -Per patient is on 3-3.5 L of oxygen at home -Remained stable on 3 L of oxygen satting 95%    Diabetes mellitus type 2 in obese [facility]) -Patient takes 70/30 at home -He is  currently on a sliding scale  BPH -Continue Flomax  Right lower lobe pulmonary nodule -Will need outpatient follow-up  Chronicdiastolic CHFwith prominent RV failure:  In the setting of COPD/OSA/OHS. H/o poor compliance w/ home O2 and BiPAP. Echo in 11/21 withEF  50-55% -Cardiology heart failure team following -Resumed torsemide 80 mg p.o. daily (reduced from twice daily) -Monitoring daily weight    Time spent in minutes: 35 minutes DVT prophylaxis: Heparin/Eliquis Code Status: Full code Family Communication: Discussed with patient and his wife at bedside  level of Care: Level of care: Telemetry Medical Disposition Plan:  Status is: Inpatient  Remains inpatient appropriate because:IV treatments appropriate due to intensity of illness or inability to take PO   Dispo: The patient is from: Home              Anticipated d/c is to: Home (home health)              Anticipated d/c date is: in a.m. if remains stable              Patient currently is not medically stable to d/c.   Difficult to place patient No      Consultants:   Cardiology Procedures:   None Antimicrobials:  Anti-infectives (From admission, onward)   Start     Dose/Rate Route Frequency Ordered Stop   12/13/20 1730  vancomycin (VANCOREADY) IVPB 1250 mg/250 mL  Status:  Discontinued        1,250 mg 166.7 mL/hr over 90 Minutes Intravenous Every 48 hours 12/12/20 0902 12/13/20 0837   12/13/20 1600  metroNIDAZOLE (FLAGYL) tablet 500 mg        500 mg Oral Every 8 hours 12/13/20 0911     12/13/20 1000  ceFEPIme (MAXIPIME) 2 g in sodium chloride 0.9 % 100 mL IVPB        2 g 200 mL/hr over 30 Minutes Intravenous Every 12 hours 12/13/20 0837     12/13/20 0930  vancomycin (VANCOREADY) IVPB 750 mg/150 mL  Status:  Discontinued        750 mg 150 mL/hr over 60 Minutes Intravenous Every 24 hours 12/13/20 0837 12/13/20 1203   12/12/20 1500  ceFEPIme (MAXIPIME) 2 g in sodium chloride 0.9 % 100 mL IVPB  Status:  Discontinued        2 g 200 mL/hr over 30 Minutes Intravenous Every 24 hours 12/11/20 1503 12/13/20 0837   12/11/20 1600  vancomycin (VANCOREADY) IVPB 1000 mg/200 mL  Status:  Discontinued        1,000 mg 200 mL/hr over 60 Minutes Intravenous  Once 12/11/20 1547 12/11/20  1553   12/11/20 1600  ceFEPIme (MAXIPIME) 2 g in sodium chloride 0.9 % 100 mL IVPB  Status:  Discontinued        2 g 200 mL/hr over 30 Minutes Intravenous  Once 12/11/20 1547 12/11/20 1553   12/11/20 1600  metroNIDAZOLE (FLAGYL) IVPB 500 mg  Status:  Discontinued        500 mg 100 mL/hr over 60 Minutes Intravenous Every 8 hours 12/11/20 1550 12/13/20 0911   12/11/20 1509  vancomycin variable dose per unstable renal function (pharmacist dosing)  Status:  Discontinued         Does not apply See admin instructions 12/11/20 1509 12/12/20 0902   12/11/20 1500  vancomycin (VANCOCIN) 2,500 mg in sodium chloride 0.9 % 500 mL IVPB        2,500 mg 250 mL/hr over 120 Minutes Intravenous  Once 12/11/20 1459  12/11/20 1919   12/11/20 1500  ceFEPIme (MAXIPIME) 2 g in sodium chloride 0.9 % 100 mL IVPB        2 g 200 mL/hr over 30 Minutes Intravenous  Once 12/11/20 1459 12/11/20 1718       Objective: Vitals:   12/15/20 1609 12/15/20 2007 12/16/20 0500 12/16/20 0523  BP: 120/72 (!) 106/58  108/62  Pulse: 79 77  75  Resp: 18 17    Temp: 99.3 F (37.4 C) 99.4 F (37.4 C)  99.1 F (37.3 C)  TempSrc: Oral Oral  Oral  SpO2: 94% 94%  95%  Weight:   98.2 kg   Height:        Intake/Output Summary (Last 24 hours) at 12/16/2020 1118 Last data filed at 12/16/2020 0823 Gross per 24 hour  Intake 1040 ml  Output 6775 ml  Net -5735 ml   Filed Weights   12/11/20 1145 12/14/20 0408 12/16/20 0500  Weight: 105.2 kg 104.3 kg 98.2 kg        Physical Exam:   General:  Alert, oriented, cooperative, no distress;   HEENT:  Normocephalic, PERRL, otherwise with in Normal limits   Neuro:  CNII-XII intact. , normal motor and sensation, reflexes intact   Lungs:   Clear to auscultation BL, Respirations unlabored, no wheezes / crackles  Cardio:    S1/S2, RRR, No murmure, No Rubs or Gallops   Abdomen:   Soft, non-tender, bowel sounds active all four quadrants,  no guarding or peritoneal signs.  Muscular  skeletal:   Severe generalized weaknesses, Limited exam - in bed, able to move all 4 extremities, Normal strength,  2+ pulses,  symmetric, No pitting edema  Skin:  Dry, warm to touch, negative for any Rashes,  Wounds: Please see nursing documentation Pressure Injury 11/22/20 Sacrum Right Stage 2 -  Partial thickness loss of dermis presenting as a shallow open injury with a red, pink wound bed without slough. 3x1 cm (Active)  11/22/20 0336  Location: Sacrum  Location Orientation: Right  Staging: Stage 2 -  Partial thickness loss of dermis presenting as a shallow open injury with a red, pink wound bed without slough.  Wound Description (Comments): 3x1 cm  Present on Admission: Yes           Data Reviewed: I have personally reviewed following labs and imaging studies  CBC: Recent Labs  Lab 12/12/20 0419 12/13/20 0505 12/14/20 0021 12/15/20 0115 12/16/20 0118  WBC 13.0* 14.8* 18.8* 11.8* 12.5*  NEUTROABS 12.7* 13.0* 14.3* 7.5 7.9*  HGB 10.0* 9.8* 9.9* 9.6* 9.9*  HCT 31.6* 31.1* 31.4* 31.0* 31.8*  MCV 84.0 83.4 84.4 83.8 82.4  PLT 401* 394 399 384 176*   Basic Metabolic Panel: Recent Labs  Lab 12/12/20 0419 12/12/20 1442 12/13/20 0505 12/13/20 0726 12/13/20 1555 12/14/20 0021 12/15/20 0115 12/16/20 0118  NA 136   < >  --  142 141 142 144 142  K 2.7*   < >  --  3.0* 3.4* 3.5 3.3* 3.4*  CL 94*   < >  --  100 102 106 106 102  CO2 25   < >  --  _0 GLUCOSE 131*   < >  --  177* 155* 117* 181* 147*  BUN 128*   < >  --  106* 95* 86* 63* 49*  CREATININE 3.20*   < >  --  2.49* 2.30* 2.07* 1.90* 1.93*  CALCIUM 8.0*   < >  --  8.6* 8.7* 8.7* 8.6* 8.6*  MG 2.6*  --  2.7*  --   --  2.6* 2.2 1.8   < > = values in this interval not displayed.   GFR: Estimated Creatinine Clearance: 42 mL/min (A) (by C-G formula based on SCr of 1.93 mg/dL (H)). Liver Function Tests: Recent Labs  Lab 12/11/20 0730  AST 39  ALT 22  ALKPHOS 124  BILITOT 1.1  PROT 7.0  ALBUMIN  2.7*   Recent Labs  Lab 12/11/20 0730  LIPASE 58*   No results for input(s): AMMONIA in the last 168 hours. Coagulation Profile: Recent Labs  Lab 12/12/20 0419  INR 1.6*   CBG: Recent Labs  Lab 12/15/20 0748 12/15/20 1150 12/15/20 1703 12/15/20 1950 12/16/20 0815  GLUCAP 133* 162* 106* 128* 139*   Urine analysis:    Component Value Date/Time   COLORURINE YELLOW 12/11/2020 1107   APPEARANCEUR HAZY (A) 12/11/2020 1107   LABSPEC 1.020 12/11/2020 1107   PHURINE 5.5 12/11/2020 1107   GLUCOSEU NEGATIVE 12/11/2020 1107   HGBUR NEGATIVE 12/11/2020 1107   BILIRUBINUR NEGATIVE 12/11/2020 1107   KETONESUR NEGATIVE 12/11/2020 1107   PROTEINUR NEGATIVE 12/11/2020 1107   UROBILINOGEN 0.2 10/26/2014 2206   NITRITE NEGATIVE 12/11/2020 1107   LEUKOCYTESUR NEGATIVE 12/11/2020 1107   Sepsis Labs: _0 (procalcitonin:4,lacticidven:4) ) Recent Results (from the past 240 hour(s))  Culture, blood (single)     Status: None (Preliminary result)   Collection Time: 12/11/20  2:58 PM   Specimen: BLOOD  Result Value Ref Range Status   Specimen Description BLOOD SITE NOT SPECIFIED  Final   Special Requests   Final    BOTTLES DRAWN AEROBIC AND ANAEROBIC Blood Culture adequate volume   Culture   Final    NO GROWTH 4 DAYS Performed at [facility], [address], [place], [place] [address]    Report Status PENDING  Incomplete  MRSA PCR Screening     Status: None   Collection Time: 12/13/20  7:48 AM   Specimen: Nasal Mucosa; Nasopharyngeal  Result Value Ref Range Status   MRSA by PCR NEGATIVE NEGATIVE Final    Comment:        The GeneXpert MRSA Assay (FDA approved for NASAL specimens only), is one component of a comprehensive MRSA colonization surveillance program. It is not intended to diagnose MRSA infection nor to guide or monitor treatment for MRSA infections. Performed at [facility], [place] [address] [place], [place] [address]       Radiology  Studies: No results found.   Scheduled Meds: . amiodarone  200 mg Oral BID  . apixaban  5 mg Oral BID  . atorvastatin  40 mg Oral Daily  . insulin aspart  0-15 Units Subcutaneous TID WC  . insulin aspart  0-5 Units Subcutaneous QHS  . lidocaine  1 patch Transdermal Q24H  . metroNIDAZOLE  500 mg Oral Q8H  . polyethylene glycol  17 g Oral Daily  . potassium chloride  40 mEq Oral Daily  . sodium chloride flush  3 mL Intravenous Q12H  . tamsulosin  0.4 mg Oral QPC breakfast  . torsemide  80 mg Oral Daily  . umeclidinium bromide  1 puff Inhalation Daily   Continuous Infusions: . ceFEPime (MAXIPIME) IV 2 g (12/16/20 0840)     LOS: 5 days     Deatra Guadalupe, MD [facility] Pager: [email or web address] 12/16/2020, 11:18 AM

## 2020-12-17 ENCOUNTER — Encounter (HOSPITAL_COMMUNITY)
Admission: EM | Disposition: A | Discharge status: Home / Self Care | Payer: Self-pay | Source: Home / Self Care | Attending: Family Medicine

## 2020-12-17 ENCOUNTER — Encounter (HOSPITAL_COMMUNITY): Payer: Self-pay | Admitting: Internal Medicine

## 2020-12-17 ENCOUNTER — Inpatient Hospital Stay (HOSPITAL_COMMUNITY): Payer: No Typology Code available for payment source | Admitting: Certified Registered Nurse Anesthetist

## 2020-12-17 ENCOUNTER — Inpatient Hospital Stay (HOSPITAL_COMMUNITY): Payer: No Typology Code available for payment source

## 2020-12-17 DIAGNOSIS — I4892 Unspecified atrial flutter: Secondary | ICD-10-CM

## 2020-12-17 DIAGNOSIS — I313 Pericardial effusion (noninflammatory): Secondary | ICD-10-CM

## 2020-12-17 DIAGNOSIS — I5023 Acute on chronic systolic (congestive) heart failure: Secondary | ICD-10-CM | POA: Diagnosis not present

## 2020-12-17 DIAGNOSIS — A419 Sepsis, unspecified organism: Secondary | ICD-10-CM | POA: Diagnosis not present

## 2020-12-17 DIAGNOSIS — R6520 Severe sepsis without septic shock: Secondary | ICD-10-CM | POA: Diagnosis not present

## 2020-12-17 HISTORY — PX: TEE WITHOUT CARDIOVERSION: SHX5443

## 2020-12-17 HISTORY — PX: CARDIOVERSION: SHX1299

## 2020-12-17 LAB — BASIC METABOLIC PANEL
Anion gap: 14 (ref 5–15)
BUN: 45 mg/dL — ABNORMAL HIGH (ref 8–23)
CO2: 32 mmol/L (ref 22–32)
Calcium: 8.6 mg/dL — ABNORMAL LOW (ref 8.9–10.3)
Chloride: 96 mmol/L — ABNORMAL LOW (ref 98–111)
Creatinine, Ser: 2.13 mg/dL — ABNORMAL HIGH (ref 0.61–1.24)
GFR, Estimated: 32 mL/min — ABNORMAL LOW (ref >60)
Glucose, Bld: 80 mg/dL (ref 70–99)
Potassium: 3.2 mmol/L — ABNORMAL LOW (ref 3.5–5.1)
Sodium: 142 mmol/L (ref 135–145)

## 2020-12-17 LAB — GLUCOSE, CAPILLARY
Glucose-Capillary: 133 mg/dL — ABNORMAL HIGH (ref 70–99)
Glucose-Capillary: 138 mg/dL — ABNORMAL HIGH (ref 70–99)
Glucose-Capillary: 96 mg/dL (ref 70–99)
Glucose-Capillary: 102 mg/dL — ABNORMAL HIGH (ref 70–99)

## 2020-12-17 SURGERY — ECHOCARDIOGRAM, TRANSESOPHAGEAL
Anesthesia: General

## 2020-12-17 MED ORDER — LIDOCAINE 2% (20 MG/ML) 5 ML SYRINGE
INTRAMUSCULAR | Status: DC | PRN
  Administered 2020-12-17: 100 mg via INTRAVENOUS

## 2020-12-17 MED ORDER — PROPOFOL 10 MG/ML IV BOLUS
INTRAVENOUS | Status: DC | PRN
  Administered 2020-12-17: 15 mg via INTRAVENOUS

## 2020-12-17 MED ORDER — SODIUM CHLORIDE 0.9 % IV SOLN: INTRAVENOUS | Status: DC | PRN

## 2020-12-17 MED ORDER — EPHEDRINE SULFATE-NACL 50-0.9 MG/10ML-% IV SOSY
PREFILLED_SYRINGE | INTRAVENOUS | Status: DC | PRN
  Administered 2020-12-17: 10 mg via INTRAVENOUS

## 2020-12-17 MED ORDER — MAGNESIUM SULFATE 2 GM/50ML IV SOLN
2.0000 g | Freq: Once | INTRAVENOUS | Status: AC
  Administered 2020-12-17: 2 g via INTRAVENOUS
  Filled 2020-12-17: qty 50

## 2020-12-17 MED ORDER — POTASSIUM CHLORIDE CRYS ER 20 MEQ PO TBCR
40.0000 meq | EXTENDED_RELEASE_TABLET | Freq: Once | ORAL | Status: AC
  Administered 2020-12-17: 40 meq via ORAL

## 2020-12-17 MED ORDER — PROPOFOL 500 MG/50ML IV EMUL
INTRAVENOUS | Status: DC | PRN
  Administered 2020-12-17: 150 ug/kg/min via INTRAVENOUS

## 2020-12-17 MED ORDER — PHENYLEPHRINE 40 MCG/ML (10ML) SYRINGE FOR IV PUSH (FOR BLOOD PRESSURE SUPPORT)
PREFILLED_SYRINGE | INTRAVENOUS | Status: DC | PRN
  Administered 2020-12-17 (×2): 200 ug via INTRAVENOUS

## 2020-12-17 MED ORDER — BUTAMBEN-TETRACAINE-BENZOCAINE 2-2-14 % EX AERO
INHALATION_SPRAY | CUTANEOUS | Status: DC | PRN
  Administered 2020-12-17: 2 via TOPICAL

## 2020-12-17 NOTE — Transfer of Care (Signed)
Immediate Anesthesia Transfer of Care Note  Patient: Nicholas Caldwell  Procedure(s) Performed: TRANSESOPHAGEAL ECHOCARDIOGRAM (TEE) (N/A ) CARDIOVERSION (N/A )  Patient Location: Endoscopy Unit  Anesthesia Type:General  Level of Consciousness: awake and alert   Airway & Oxygen Therapy: Patient Spontanous Breathing and Patient connected to nasal cannula oxygen  Post-op Assessment: Report given to RN and Post -op Vital signs reviewed and stable  Post vital signs: Reviewed and stable  Last Vitals:  Vitals Value Taken Time  BP 90/56 12/17/20 1212  Temp    Pulse 85 12/17/20 1213  Resp 21 12/17/20 1213  SpO2 91 % 12/17/20 1213  Vitals shown include unvalidated device data.  Last Pain:  Vitals:   12/17/20 1212  TempSrc:   PainSc: 0-No pain      Patients Stated Pain Goal: 3 (50/35/46 5681)  Complications: No complications documented.

## 2020-12-17 NOTE — Addendum Note (Signed)
Addendum  created 12/17/20 2011 by Freddrick March, MD   Clinical Note Signed

## 2020-12-17 NOTE — Progress Notes (Signed)
PT Cancellation Note  Patient Details Name: Nicholas Caldwell MRN: [id number] DOB: 1946/06/13   Cancelled Treatment:    Reason Eval/Treat Not Completed: Patient at procedure or test/unavailable.  Pt is in TEE procedure and will retry at another time.   Ramond Dial 12/17/2020, 11:46 AM  Mee Hives, PT MS Acute Rehab Dept. Number: [place] and [place]

## 2020-12-17 NOTE — Progress Notes (Signed)
Patient ID: Nicholas Caldwell, male   DOB: 09-15-1946, 75 y.o.   MRN: [id number]    Advanced Heart Failure Rounding Note  PCP-Cardiologist: No primary care provider on file.   Subjective:    Creatinine mildly higher at 2.13 today.  I/Os negative.   Patient remains afebrile with WBCs 12. He is on cefepime for PNA.    Has not been out of bed.  Denies dyspnea.    Objective:   Weight Range: 96.2 kg Body mass index is 28.76 kg/m.   Vital Signs:   Temp:  [98.5 F (36.9 C)-100 F (37.8 C)] 98.8 F (37.1 C) (02/14 0403) Pulse Rate:  [76-78] 76 (02/14 0403) Resp:  [18-20] 18 (02/14 0403) BP: (102-125)/(50-63) 108/62 (02/14 0403) SpO2:  [93 %-98 %] 93 % (02/14 0837) Weight:  [96.2 kg] 96.2 kg (02/14 0500) Last BM Date: 12/16/20  Weight change: Filed Weights   12/14/20 0408 12/16/20 0500 12/17/20 0500  Weight: 104.3 kg 98.2 kg 96.2 kg    Intake/Output:   Intake/Output Summary (Last 24 hours) at 12/17/2020 0849 Last data filed at 12/16/2020 2209 Gross per 24 hour  Intake 360 ml  Output 3525 ml  Net -3165 ml      Physical Exam    General: NAD Neck: JVP 8 cm, no thyromegaly or thyroid nodule.  Lungs: Decreased BS at bases.  CV: Nondisplaced PMI.  Heart irregular S1/S2, no S3/S4, no murmur.  No peripheral edema.   Abdomen: Soft, nontender, no hepatosplenomegaly, no distention.  Skin: Intact without lesions or rashes.  Neurologic: Alert and oriented x 3.  Psych: Normal affect. Extremities: No clubbing or cyanosis.  HEENT: Normal.    Telemetry   Atrial flutter 70s (personally reviewed)   Labs    CBC Recent Labs    12/15/20 0115 12/16/20 0118  WBC 11.8* 12.5*  NEUTROABS 7.5 7.9*  HGB 9.6* 9.9*  HCT 31.0* 31.8*  MCV 83.8 82.4  PLT 384 016*   Basic Metabolic Panel Recent Labs    12/15/20 0115 12/16/20 0118 12/17/20 0222  NA 144 142 142  K 3.3* 3.4* 3.2*  CL 106 102 96*  CO2 27 30 32  GLUCOSE 181* 147* 80  BUN 63* 49* 45*  CREATININE 1.90* 1.93* 2.13*   CALCIUM 8.6* 8.6* 8.6*  MG 2.2 1.8  --    Liver Function Tests No results for input(s): AST, ALT, ALKPHOS, BILITOT, PROT, ALBUMIN in the last 72 hours. No results for input(s): LIPASE, AMYLASE in the last 72 hours. Cardiac Enzymes No results for input(s): CKTOTAL, CKMB, CKMBINDEX, TROPONINI in the last 72 hours.  BNP: BNP (last 3 results) Recent Labs    10/29/20 2252 11/21/20 1944 12/11/20 0730  BNP 2,913.5* 2,475.4* 1,858.3*    ProBNP (last 3 results) No results for input(s): PROBNP in the last 8760 hours.   D-Dimer No results for input(s): DDIMER in the last 72 hours. Hemoglobin A1C No results for input(s): HGBA1C in the last 72 hours. Fasting Lipid Panel No results for input(s): CHOL, HDL, LDLCALC, TRIG, CHOLHDL, LDLDIRECT in the last 72 hours. Thyroid Function Tests No results for input(s): TSH, T4TOTAL, T3FREE, THYROIDAB in the last 72 hours.  Invalid input(s): FREET3  Other results:   Imaging    No results found.   Medications:     Scheduled Medications: . amiodarone  200 mg Oral BID  . apixaban  5 mg Oral BID  . atorvastatin  40 mg Oral Daily  . insulin aspart  0-15 Units Subcutaneous TID WC  .  insulin aspart  0-5 Units Subcutaneous QHS  . lidocaine  1 patch Transdermal Q24H  . metroNIDAZOLE  500 mg Oral Q8H  . polyethylene glycol  17 g Oral Daily  . potassium chloride  40 mEq Oral Daily  . potassium chloride  40 mEq Oral Once  . sodium chloride flush  3 mL Intravenous Q12H  . tamsulosin  0.4 mg Oral QPC breakfast  . torsemide  80 mg Oral Daily  . umeclidinium bromide  1 puff Inhalation Daily    Infusions: . ceFEPime (MAXIPIME) IV 2 g (12/16/20 2115)  . magnesium sulfate bolus IVPB      PRN Medications: acetaminophen **OR** acetaminophen, albuterol, oxyCODONE-acetaminophen **AND** oxyCODONE    Assessment/Plan   1. ID: L-sided PNA on CXR, PCT elevated 4.5 initially.  He was admitted with sepsis.  Currently afebrile.  - On cefepime  per primary service.  2.Chronic diastolic CHFwith prominent RV failure: Likely related to COPD/OSA/OHS.H/o poor compliance w/ home O2 and BiPAP.Echo in 11/21 withEF 50-55%, RV severely reduced w/ severely elevated RV systolic pressure, 68 mmHg.Poor po intake recently with nausea/vomiting, initially with AKI on CKD stage 3. Creatinine fairly stable at 2.1 today.  He is not particularly volume overloaded on exam.  He has prominent RV failure with tendency towards volume overload/CHF.  - On torsemide 80 mg bid at home, continue torsemide 80 mg daily and replace K.  3. Chronic Hypoxic/Hypercarbic Respiratory failure: Due toCHF + COPD/OSA/OHS w/ poor compliance w/ home O2 and BiPAP. Now also with PNA.  -Continue daytime O2 and BiPAP during sleep. 4. AKI onStage3CKD: Baseline creatinine 1.8, up to 3.56 this admission, 2.13 today  - Follow closely.   5.Atrial fibrillation/flutter:Paroxysmal. He has history of CVA. He had DCCV in 3/20 and again in 5/20. He failed amiodarone in the past and cannot take Tikosyn due to baseline prolonged QT interval. However, he has been back on amiodarone and was in NSR when I last saw him.  This admission, he is back in atrial flutter with controlled rate.  - Continue Eliquis.  - Continue  amiodarone 200 mg bid (dose increased).  - Will plan for TEE-guided DCCV today.  Discussed risks/benefits with patient and he agrees to procedure.  6. COPD:No longer smokes. Continue homeoxygen.  - Now off Solumedrol.   7.OHS/OSA:Continue oxygen during the day andBIPAP at night.  8.CAD:Nonobstructive disease in 2/20. No chest pain.  - no ASA due to anticoagulation.  -Atorvastatin.  9. Hypokalemia: Replace K.  10. Deconditioning: PT/OT. Out of bed.   Length of Stay: 6  Loralie Champagne, MD  12/17/2020, 8:49 AM  Advanced Heart Failure Team Pager [phone] (M-F; 7a - 4p)  Please contact [facility] for night-coverage after hours (4p -7a ) and weekends on  [email or web address]

## 2020-12-17 NOTE — CV Procedure (Addendum)
Procedure: TEE  Sedation: Per anesthesiology  Indication: Atrial flutter  Findings: Please see echo section for full report.  Normal LV size with mild LV hypertrophy. EF 50% with mild diffuse hypokinesis.  The RV was severely dilated and severely dysfunctional with D-shaped interventricular septum suggestive of RV pressure/volume overload.  Normal left atrial size, no LA appendage thrombus.  Moderately dilated RA.  No PFO or ASD by color doppler.  Mild-moderate TR, peak RV-RA gradient 53 mmHg.  Trivial MR.  Trileaflet aortic valve with no stenosis, mild aortic insufficiency.  Normal caliber thoracic aorta with grade 3 plaque descending thoracic aorta. Small pericardial effusion.   Impression: May proceed with DCCV.   Loralie Champagne 12/17/2020 12:04 PM

## 2020-12-17 NOTE — Anesthesia Preprocedure Evaluation (Addendum)
Anesthesia Evaluation  Patient identified by MRN, date of birth, ID band Patient awake    Reviewed: Allergy & Precautions, NPO status , Patient's Chart, lab work & pertinent test results  Airway Mallampati: II  TM Distance: >3 FB Neck ROM: Full    Dental  (+) Edentulous Upper, Edentulous Lower   Pulmonary shortness of breath, sleep apnea , COPD (3-4L O2),  oxygen dependent, former smoker,    Pulmonary exam normal breath sounds clear to auscultation       Cardiovascular hypertension, + CAD and +CHF  Normal cardiovascular exam+ dysrhythmias Atrial Fibrillation  Rhythm:Irregular Rate:Normal  TTE 2021 1. Left ventricular ejection fraction, by estimation, is 50 to 55%. The left ventricle has low normal function. The left ventricle has no regional wall motion abnormalities. Left ventricular diastolic function could not be evaluated.  2. Right ventricular systolic function is severely reduced. The right ventricular size is severely enlarged. There is severely elevated pulmonary artery systolic pressure. The estimated right ventricular systolic pressure is 81.8 mmHg.  3. Right atrial size was severely dilated.  4. The mitral valve was not well visualized. Trivial mitral valve regurgitation.  5. Tricuspid valve regurgitation is moderate to severe.  6. The aortic valve was not well visualized. Aortic valve regurgitation is not visualized.  7. Severely dilated pulmonary artery.   Cath 2020 1. Nonobstructive CAD.  2. Filling pressures fairly well optimized.  3. Preserved cardiac output.  4. Moderate pulmonary hypertension, suspect mixed pulmonary venous hypertension and PAH due to COPD.  PVR not significantly elevated.    Neuro/Psych CVA negative psych ROS   GI/Hepatic negative GI ROS, Neg liver ROS,   Endo/Other  negative endocrine ROSdiabetes, Insulin Dependent  Renal/GU Renal InsufficiencyRenal disease (CKD4)  negative  genitourinary   Musculoskeletal negative musculoskeletal ROS (+)   Abdominal   Peds  Hematology  (+) Blood dyscrasia (Hgb 9.9, on eliquis), anemia ,   Anesthesia Other Findings   Reproductive/Obstetrics                            Anesthesia Physical Anesthesia Plan  ASA: III  Anesthesia Plan: General   Post-op Pain Management:    Induction: Intravenous  PONV Risk Score and Plan: 2 and Propofol infusion and Treatment may vary due to age or medical condition  Airway Management Planned: Natural Airway  Additional Equipment:   Intra-op Plan:   Post-operative Plan:   Informed Consent: I have reviewed the patients History and Physical, chart, labs and discussed the procedure including the risks, benefits and alternatives for the proposed anesthesia with the patient or authorized representative who has indicated his/her understanding and acceptance.     Dental advisory given  Plan Discussed with: CRNA  Anesthesia Plan Comments:        Anesthesia Quick Evaluation

## 2020-12-17 NOTE — Interval H&P Note (Signed)
History and Physical Interval Note:  12/17/2020 11:43 AM  Nicholas Caldwell  has presented today for surgery, with the diagnosis of A-FIB.  The various methods of treatment have been discussed with the patient and family. After consideration of risks, benefits and other options for treatment, the patient has consented to  Procedure(s): TRANSESOPHAGEAL ECHOCARDIOGRAM (TEE) (N/A) CARDIOVERSION (N/A) as a surgical intervention.  The patient's history has been reviewed, patient examined, no change in status, stable for surgery.  I have reviewed the patient's chart and labs.  Questions were answered to the patient's satisfaction.     Alyissa Whidbee [employer]

## 2020-12-17 NOTE — Progress Notes (Signed)
PROGRESS NOTE    [name]   BOF:[id number]  DOB: 03-01-1946  DOA: 12/11/2020 PCP: [name], MD   Brief Narrative:  Nicholas Caldwell is a 75 yo male with PMH chronic dCHF, CKD4, afib (on Eliquis), COPD (on chronic 3-4 L O2), OHS/OSA, HTN, HLD, pulmonary HTN, DMII who presented to the ER with abdominal distention/bloating and pain that had been going on for approximately 2 weeks.  He also had associated nausea/vomiting.  He was very lethargic in the ER but able to provide some information.  He endorsed no chest pain or worsening shortness of breath despite imaging findings and only endorsed a mild nonproductive cough. He states he is on diuretics at home but states he was not taking them recently; he has also had poor appetite.  In the ED: Chest x-ray reveals large left-sided consolidation concerning for pneumonia. CT of the abdomen pelvis reveals a large stool burden with a 7 cm stool ball in the rectum. BUN 138, creatinine 3.96, potassium 2.5 WBC count 16.4 Covid negative  The patient received vancomycin and cefepime in the ED and was admitted to the hospital. Patient was hospitalized this past January and released on 11/27/2020 after being treated for acute on chronic heart failure.   Subjective: Patient seen and examined.  No complaints  Assessment & Plan:   Principal Problem:   Severe sepsis -Secondary to left sided pneumonia  -improving -He was recently hospitalized, he is being treated as hospital-acquired infection -WBC 16.4  >>> 18.8 >> 11.8t >> 12.5 today -Procalcitonin 4.50-has improved to 3.22 >>> 1.52 - MRSA PCR is negative- stop Vanc -Blood Cultures --- reviewed, NGTD -Steroid has been discontinued , since no signs of COPD exacerbation  -Signs and symptoms of sepsis has resolved patient remained stable  We will continue cefepime, Flagyl >> may be discontinued tomorrow after 5 days    Active Problems:  Acute renal failure superimposed on stage  IIIb -Creatinine improving 2.49, 2.30, 2.07 >>1.90 >> 1.93 > 2.13 -Likely due to poor p.o. intake -Responding to IV fluid>>> discontinued  12/15/20 -Cardiology resumed diuretics -Appreciate cardiology involvement.  Creatinine with slight jump today.  Monitor tomorrow.  Abdominal pain and severe abdominal distention -Much improved positive bowel movements -Secondary to constipation due to chronic use of narcotics,  Hypokalemia 3.2 today.  Replaced today.  Recheck in the morning.  Chronic pain -He states his pain is chronically present in his back and his hips -Home dose of oxycodone/acetaminophen 10 mg every 6 hours as needed has been resumed -The patient and his wife has been informed regarding his chronic narcotic use which is related to his constipation    PAF (paroxysmal atrial fibrillation)  -Continue amiodarone and Eliquis.  Scheduled for DCCV today.    COPD (chronic obstructive pulmonary disease) (HCC)   Chronic respiratory failure with hypoxia  -Per patient is on 3-3.5 L of oxygen at home -Remained stable on 3 L of oxygen satting 95%    Diabetes mellitus type 2 in obese [facility]) -Patient takes 70/30 at home -He is currently on a sliding scale.  Blood sugar slightly labile.  BPH -Continue Flomax  Right lower lobe pulmonary nodule -Will need outpatient follow-up  Chronicdiastolic CHFwith prominent RV failure:  In the setting of COPD/OSA/OHS. H/o poor compliance w/ home O2 and BiPAP. Echo in 11/21 withEF 50-55% -Cardiology heart failure team following -Resumed torsemide 80 mg p.o. daily (reduced from twice daily) -Monitoring daily weight  Time spent in minutes: 34 minutes DVT prophylaxis: Eliquis Code Status:  Full code Family Communication: Discussed with patient.  No family present. level of Care: Level of care: Telemetry Medical Disposition Plan:  Status is: Inpatient  Remains inpatient appropriate because:IV treatments appropriate due to intensity of  illness or inability to take PO   Dispo: The patient is from: Home              Anticipated d/c is to: Home (home health)              Anticipated d/c date is: in a.m. if remains stable              Patient currently is not medically stable to d/c.   Difficult to place patient No      Consultants:   Cardiology Procedures:   None Antimicrobials:  Anti-infectives (From admission, onward)   Start     Dose/Rate Route Frequency Ordered Stop   12/13/20 1730  vancomycin (VANCOREADY) IVPB 1250 mg/250 mL  Status:  Discontinued        1,250 mg 166.7 mL/hr over 90 Minutes Intravenous Every 48 hours 12/12/20 0902 12/13/20 0837   12/13/20 1600  [MAR Hold]  metroNIDAZOLE (FLAGYL) tablet 500 mg        (MAR Hold since Mon 12/17/2020 at 1112.Hold Reason: Transfer to a Procedural area.)   500 mg Oral Every 8 hours 12/13/20 0911     12/13/20 1000  [MAR Hold]  ceFEPIme (MAXIPIME) 2 g in sodium chloride 0.9 % 100 mL IVPB        (MAR Hold since Mon 12/17/2020 at 1112.Hold Reason: Transfer to a Procedural area.)   2 g 200 mL/hr over 30 Minutes Intravenous Every 12 hours 12/13/20 0837     12/13/20 0930  vancomycin (VANCOREADY) IVPB 750 mg/150 mL  Status:  Discontinued        750 mg 150 mL/hr over 60 Minutes Intravenous Every 24 hours 12/13/20 0837 12/13/20 1203   12/12/20 1500  ceFEPIme (MAXIPIME) 2 g in sodium chloride 0.9 % 100 mL IVPB  Status:  Discontinued        2 g 200 mL/hr over 30 Minutes Intravenous Every 24 hours 12/11/20 1503 12/13/20 0837   12/11/20 1600  vancomycin (VANCOREADY) IVPB 1000 mg/200 mL  Status:  Discontinued        1,000 mg 200 mL/hr over 60 Minutes Intravenous  Once 12/11/20 1547 12/11/20 1553   12/11/20 1600  ceFEPIme (MAXIPIME) 2 g in sodium chloride 0.9 % 100 mL IVPB  Status:  Discontinued        2 g 200 mL/hr over 30 Minutes Intravenous  Once 12/11/20 1547 12/11/20 1553   12/11/20 1600  metroNIDAZOLE (FLAGYL) IVPB 500 mg  Status:  Discontinued        500 mg 100 mL/hr  over 60 Minutes Intravenous Every 8 hours 12/11/20 1550 12/13/20 0911   12/11/20 1509  vancomycin variable dose per unstable renal function (pharmacist dosing)  Status:  Discontinued         Does not apply See admin instructions 12/11/20 1509 12/12/20 0902   12/11/20 1500  vancomycin (VANCOCIN) 2,500 mg in sodium chloride 0.9 % 500 mL IVPB        2,500 mg 250 mL/hr over 120 Minutes Intravenous  Once 12/11/20 1459 12/11/20 1919   12/11/20 1500  ceFEPIme (MAXIPIME) 2 g in sodium chloride 0.9 % 100 mL IVPB        2 g 200 mL/hr over 30 Minutes Intravenous  Once 12/11/20 1459 12/11/20 1718  Objective: Vitals:   12/17/20 0403 12/17/20 0500 12/17/20 0837 12/17/20 1115  BP: 108/62     Pulse: 76   72  Resp: 18     Temp: 98.8 F (37.1 C)   99.3 F (37.4 C)  TempSrc: Oral   Temporal  SpO2: 96%  93%   Weight:  96.2 kg    Height:        Intake/Output Summary (Last 24 hours) at 12/17/2020 1119 Last data filed at 12/16/2020 2209 Gross per 24 hour  Intake 360 ml  Output 3525 ml  Net -3165 ml   Filed Weights   12/14/20 0408 12/16/20 0500 12/17/20 0500  Weight: 104.3 kg 98.2 kg 96.2 kg   General exam: Appears calm and comfortable  Respiratory system: Clear to auscultation. Respiratory effort normal. Cardiovascular system: S1 & S2 heard, irregularly irregular rate and rhythm. No JVD, murmurs, rubs, gallops or clicks. No pedal edema. Gastrointestinal system: Abdomen is nondistended, soft and nontender. No organomegaly or masses felt. Normal bowel sounds heard. Central nervous system: Alert and oriented. No focal neurological deficits. Extremities: Symmetric 5 x 5 power. Skin: No rashes, lesions or ulcers.  Psychiatry: Judgement and insight appear normal. Mood & affect appropriate.   Wounds: Please see nursing documentation  Pressure Injury 11/22/20 Sacrum Right Stage 2 -  Partial thickness loss of dermis presenting as a shallow open injury with a red, pink wound bed without slough.  3x1 cm (Active)  11/22/20 0336  Location: Sacrum  Location Orientation: Right  Staging: Stage 2 -  Partial thickness loss of dermis presenting as a shallow open injury with a red, pink wound bed without slough.  Wound Description (Comments): 3x1 cm  Present on Admission: Yes           Data Reviewed: I have personally reviewed following labs and imaging studies  CBC: Recent Labs  Lab 12/12/20 0419 12/13/20 0505 12/14/20 0021 12/15/20 0115 12/16/20 0118  WBC 13.0* 14.8* 18.8* 11.8* 12.5*  NEUTROABS 12.7* 13.0* 14.3* 7.5 7.9*  HGB 10.0* 9.8* 9.9* 9.6* 9.9*  HCT 31.6* 31.1* 31.4* 31.0* 31.8*  MCV 84.0 83.4 84.4 83.8 82.4  PLT 401* 394 399 384 606*   Basic Metabolic Panel: Recent Labs  Lab 12/12/20 0419 12/12/20 1442 12/13/20 0505 12/13/20 0726 12/13/20 1555 12/14/20 0021 12/15/20 0115 12/16/20 0118 12/17/20 0222  NA 136   < >  --    < > 141 142 144 142 142  K 2.7*   < >  --    < > 3.4* 3.5 3.3* 3.4* 3.2*  CL 94*   < >  --    < > 102 106 106 102 96*  CO2 25   < >  --    < > _0 32  GLUCOSE 131*   < >  --    < > 155* 117* 181* 147* 80  BUN 128*   < >  --    < > 95* 86* 63* 49* 45*  CREATININE 3.20*   < >  --    < > 2.30* 2.07* 1.90* 1.93* 2.13*  CALCIUM 8.0*   < >  --    < > 8.7* 8.7* 8.6* 8.6* 8.6*  MG 2.6*  --  2.7*  --   --  2.6* 2.2 1.8  --    < > = values in this interval not displayed.   GFR: Estimated Creatinine Clearance: 37.7 mL/min (A) (by C-G formula based on SCr of 2.13 mg/dL (  H)). Liver Function Tests: Recent Labs  Lab 12/11/20 0730  AST 39  ALT 22  ALKPHOS 124  BILITOT 1.1  PROT 7.0  ALBUMIN 2.7*   Recent Labs  Lab 12/11/20 0730  LIPASE 58*   No results for input(s): AMMONIA in the last 168 hours. Coagulation Profile: Recent Labs  Lab 12/12/20 0419  INR 1.6*   CBG: Recent Labs  Lab 12/16/20 0815 12/16/20 1128 12/16/20 1622 12/16/20 2033 12/17/20 0737  GLUCAP 139* 122* 110* 265* 133*   Urine analysis:     Component Value Date/Time   COLORURINE YELLOW 12/11/2020 1107   APPEARANCEUR HAZY (A) 12/11/2020 1107   LABSPEC 1.020 12/11/2020 1107   PHURINE 5.5 12/11/2020 1107   GLUCOSEU NEGATIVE 12/11/2020 1107   HGBUR NEGATIVE 12/11/2020 1107   BILIRUBINUR NEGATIVE 12/11/2020 1107   KETONESUR NEGATIVE 12/11/2020 1107   PROTEINUR NEGATIVE 12/11/2020 1107   UROBILINOGEN 0.2 10/26/2014 2206   NITRITE NEGATIVE 12/11/2020 1107   LEUKOCYTESUR NEGATIVE 12/11/2020 1107   Sepsis Labs: _0 (procalcitonin:4,lacticidven:4) ) Recent Results (from the past 240 hour(s))  Culture, blood (single)     Status: None   Collection Time: 12/11/20  2:58 PM   Specimen: BLOOD  Result Value Ref Range Status   Specimen Description BLOOD SITE NOT SPECIFIED  Final   Special Requests   Final    BOTTLES DRAWN AEROBIC AND ANAEROBIC Blood Culture adequate volume   Culture   Final    NO GROWTH 5 DAYS Performed at [facility], [place] [address], [place], [place] [address]    Report Status 12/16/2020 FINAL  Final  MRSA PCR Screening     Status: None   Collection Time: 12/13/20  7:48 AM   Specimen: Nasal Mucosa; Nasopharyngeal  Result Value Ref Range Status   MRSA by PCR NEGATIVE NEGATIVE Final    Comment:        The GeneXpert MRSA Assay (FDA approved for NASAL specimens only), is one component of a comprehensive MRSA colonization surveillance program. It is not intended to diagnose MRSA infection nor to guide or monitor treatment for MRSA infections. Performed at [facility], [place] [address], [place], [place] [address]       Radiology Studies: No results found.   Scheduled Meds: . [MAR Hold] amiodarone  200 mg Oral BID  . [MAR Hold] apixaban  5 mg Oral BID  . [MAR Hold] atorvastatin  40 mg Oral Daily  . [MAR Hold] insulin aspart  0-15 Units Subcutaneous TID WC  . [MAR Hold] insulin aspart  0-5 Units Subcutaneous QHS  . [MAR Hold] lidocaine  1 patch Transdermal Q24H  . [MAR Hold]  metroNIDAZOLE  500 mg Oral Q8H  . [MAR Hold] polyethylene glycol  17 g Oral Daily  . [MAR Hold] potassium chloride  40 mEq Oral Daily  . [MAR Hold] potassium chloride  40 mEq Oral Once  . [MAR Hold] sodium chloride flush  3 mL Intravenous Q12H  . [MAR Hold] tamsulosin  0.4 mg Oral QPC breakfast  . [MAR Hold] torsemide  80 mg Oral Daily  . [MAR Hold] umeclidinium bromide  1 puff Inhalation Daily   Continuous Infusions: . [MAR Hold] ceFEPime (MAXIPIME) IV 2 g (12/17/20 0943)  . [MAR Hold] magnesium sulfate bolus IVPB       LOS: 6 days    Darliss Cheney, MD [facility] Pager: [email or web address] 12/17/2020, 11:19 AM

## 2020-12-17 NOTE — H&P (View-Only) (Signed)
Patient ID: Nicholas Caldwell, male   DOB: 08/26/1946, 75 y.o.   MRN: [id number]    Advanced Heart Failure Rounding Note  PCP-Cardiologist: No primary care provider on file.   Subjective:    Creatinine mildly higher at 2.13 today.  I/Os negative.   Patient remains afebrile with WBCs 12. He is on cefepime for PNA.    Has not been out of bed.  Denies dyspnea.    Objective:   Weight Range: 96.2 kg Body mass index is 28.76 kg/m.   Vital Signs:   Temp:  [98.5 F (36.9 C)-100 F (37.8 C)] 98.8 F (37.1 C) (02/14 0403) Pulse Rate:  [76-78] 76 (02/14 0403) Resp:  [18-20] 18 (02/14 0403) BP: (102-125)/(50-63) 108/62 (02/14 0403) SpO2:  [93 %-98 %] 93 % (02/14 0837) Weight:  [96.2 kg] 96.2 kg (02/14 0500) Last BM Date: 12/16/20  Weight change: Filed Weights   12/14/20 0408 12/16/20 0500 12/17/20 0500  Weight: 104.3 kg 98.2 kg 96.2 kg    Intake/Output:   Intake/Output Summary (Last 24 hours) at 12/17/2020 0849 Last data filed at 12/16/2020 2209 Gross per 24 hour  Intake 360 ml  Output 3525 ml  Net -3165 ml      Physical Exam    General: NAD Neck: JVP 8 cm, no thyromegaly or thyroid nodule.  Lungs: Decreased BS at bases.  CV: Nondisplaced PMI.  Heart irregular S1/S2, no S3/S4, no murmur.  No peripheral edema.   Abdomen: Soft, nontender, no hepatosplenomegaly, no distention.  Skin: Intact without lesions or rashes.  Neurologic: Alert and oriented x 3.  Psych: Normal affect. Extremities: No clubbing or cyanosis.  HEENT: Normal.    Telemetry   Atrial flutter 70s (personally reviewed)   Labs    CBC Recent Labs    12/15/20 0115 12/16/20 0118  WBC 11.8* 12.5*  NEUTROABS 7.5 7.9*  HGB 9.6* 9.9*  HCT 31.0* 31.8*  MCV 83.8 82.4  PLT 384 425*   Basic Metabolic Panel Recent Labs    12/15/20 0115 12/16/20 0118 12/17/20 0222  NA 144 142 142  K 3.3* 3.4* 3.2*  CL 106 102 96*  CO2 27 30 32  GLUCOSE 181* 147* 80  BUN 63* 49* 45*  CREATININE 1.90* 1.93* 2.13*   CALCIUM 8.6* 8.6* 8.6*  MG 2.2 1.8  --    Liver Function Tests No results for input(s): AST, ALT, ALKPHOS, BILITOT, PROT, ALBUMIN in the last 72 hours. No results for input(s): LIPASE, AMYLASE in the last 72 hours. Cardiac Enzymes No results for input(s): CKTOTAL, CKMB, CKMBINDEX, TROPONINI in the last 72 hours.  BNP: BNP (last 3 results) Recent Labs    10/29/20 2252 11/21/20 1944 12/11/20 0730  BNP 2,913.5* 2,475.4* 1,858.3*    ProBNP (last 3 results) No results for input(s): PROBNP in the last 8760 hours.   D-Dimer No results for input(s): DDIMER in the last 72 hours. Hemoglobin A1C No results for input(s): HGBA1C in the last 72 hours. Fasting Lipid Panel No results for input(s): CHOL, HDL, LDLCALC, TRIG, CHOLHDL, LDLDIRECT in the last 72 hours. Thyroid Function Tests No results for input(s): TSH, T4TOTAL, T3FREE, THYROIDAB in the last 72 hours.  Invalid input(s): FREET3  Other results:   Imaging    No results found.   Medications:     Scheduled Medications: . amiodarone  200 mg Oral BID  . apixaban  5 mg Oral BID  . atorvastatin  40 mg Oral Daily  . insulin aspart  0-15 Units Subcutaneous TID WC  .   insulin aspart  0-5 Units Subcutaneous QHS  . lidocaine  1 patch Transdermal Q24H  . metroNIDAZOLE  500 mg Oral Q8H  . polyethylene glycol  17 g Oral Daily  . potassium chloride  40 mEq Oral Daily  . potassium chloride  40 mEq Oral Once  . sodium chloride flush  3 mL Intravenous Q12H  . tamsulosin  0.4 mg Oral QPC breakfast  . torsemide  80 mg Oral Daily  . umeclidinium bromide  1 puff Inhalation Daily    Infusions: . ceFEPime (MAXIPIME) IV 2 g (12/16/20 2115)  . magnesium sulfate bolus IVPB      PRN Medications: acetaminophen **OR** acetaminophen, albuterol, oxyCODONE-acetaminophen **AND** oxyCODONE    Assessment/Plan   1. ID: L-sided PNA on CXR, PCT elevated 4.5 initially.  He was admitted with sepsis.  Currently afebrile.  - On cefepime  per primary service.  2.Chronic diastolic CHFwith prominent RV failure: Likely related to COPD/OSA/OHS.H/o poor compliance w/ home O2 and BiPAP.Echo in 11/21 withEF 50-55%, RV severely reduced w/ severely elevated RV systolic pressure, 68 mmHg.Poor po intake recently with nausea/vomiting, initially with AKI on CKD stage 3. Creatinine fairly stable at 2.1 today.  He is not particularly volume overloaded on exam.  He has prominent RV failure with tendency towards volume overload/CHF.  - On torsemide 80 mg bid at home, continue torsemide 80 mg daily and replace K.  3. Chronic Hypoxic/Hypercarbic Respiratory failure: Due toCHF + COPD/OSA/OHS w/ poor compliance w/ home O2 and BiPAP. Now also with PNA.  -Continue daytime O2 and BiPAP during sleep. 4. AKI onStage3CKD: Baseline creatinine 1.8, up to 3.56 this admission, 2.13 today  - Follow closely.   5.Atrial fibrillation/flutter:Paroxysmal. He has history of CVA. He had DCCV in 3/20 and again in 5/20. He failed amiodarone in the past and cannot take Tikosyn due to baseline prolonged QT interval. However, he has been back on amiodarone and was in NSR when I last saw him.  This admission, he is back in atrial flutter with controlled rate.  - Continue Eliquis.  - Continue  amiodarone 200 mg bid (dose increased).  - Will plan for TEE-guided DCCV today.  Discussed risks/benefits with patient and he agrees to procedure.  6. COPD:No longer smokes. Continue homeoxygen.  - Now off Solumedrol.   7.OHS/OSA:Continue oxygen during the day andBIPAP at night.  8.CAD:Nonobstructive disease in 2/20. No chest pain.  - no ASA due to anticoagulation.  -Atorvastatin.  9. Hypokalemia: Replace K.  10. Deconditioning: PT/OT. Out of bed.   Length of Stay: 6  Aarini Slee, MD  12/17/2020, 8:49 AM  Advanced Heart Failure Team Pager [phone] (M-F; 7a - 4p)  Please contact [facility] for night-coverage after hours (4p -7a ) and weekends on  [email or web address]  

## 2020-12-17 NOTE — Procedures (Signed)
Electrical Cardioversion Procedure Note Cutter Passey [id number] Aug 31, 1946  Procedure: Electrical Cardioversion Indications:  Atrial Flutter  Procedure Details Consent: Risks of procedure as well as the alternatives and risks of each were explained to the (patient/caregiver).  Consent for procedure obtained. Time Out: Verified patient identification, verified procedure, site/side was marked, verified correct patient position, special equipment/implants available, medications/allergies/relevent history reviewed, required imaging and test results available.  Performed  Patient placed on cardiac monitor, pulse oximetry, supplemental oxygen as necessary.  Sedation given: Propofol per anesthesiology Pacer pads placed anterior and posterior chest.  Cardioverted 1 time(s).  Cardioverted at [place].  Evaluation Findings: Post procedure EKG shows: NSR with PACs.  Complications: None Patient did tolerate procedure well.   Loralie Champagne 12/17/2020, 12:04 PM

## 2020-12-17 NOTE — Progress Notes (Signed)
OT Cancellation Note  Patient Details Name: Nicholas Caldwell MRN: [id number] DOB: 1946-05-03   Cancelled Treatment:    Reason Eval/Treat Not Completed: Patient at procedure or test/ unavailable. Pt off unit for TEE procedure. OT will follow up next available time as appropriate  Britt Bottom 12/17/2020, 11:54 AM

## 2020-12-17 NOTE — Anesthesia Postprocedure Evaluation (Signed)
Anesthesia Post Note  Patient: Nicholas Caldwell  Procedure(s) Performed: TRANSESOPHAGEAL ECHOCARDIOGRAM (TEE) (N/A ) CARDIOVERSION (N/A )     Patient location during evaluation: Endoscopy Anesthesia Type: General Level of consciousness: awake and alert Pain management: pain level controlled Vital Signs Assessment: post-procedure vital signs reviewed and stable Respiratory status: spontaneous breathing, nonlabored ventilation, respiratory function stable and patient connected to nasal cannula oxygen Cardiovascular status: blood pressure returned to baseline and stable Postop Assessment: no apparent nausea or vomiting Anesthetic complications: no   No complications documented.  Last Vitals:  Vitals:   12/17/20 1334 12/17/20 2001  BP: 114/69 113/65  Pulse: 84 75  Resp: 18 20  Temp: 36.8 C 36.9 C  SpO2: 97% 95%    Last Pain:  Vitals:   12/17/20 2001  TempSrc: Oral  PainSc:    Pain Goal: Patients Stated Pain Goal: 4 (12/17/20 1958)                 [place]

## 2020-12-17 NOTE — Progress Notes (Signed)
  Echocardiogram Echocardiogram Transesophageal has been performed.  Nicholas Caldwell 12/17/2020, 12:10 PM

## 2020-12-17 NOTE — Anesthesia Procedure Notes (Signed)
Procedure Name: General with mask airway Date/Time: 12/17/2020 12:14 PM Performed by: Imagene Riches, CRNA Pre-anesthesia Checklist: Patient identified, Emergency Drugs available, Suction available, Patient being monitored and Timeout performed Patient Re-evaluated:Patient Re-evaluated prior to induction Oxygen Delivery Method: Nasal cannula

## 2020-12-18 DIAGNOSIS — I5023 Acute on chronic systolic (congestive) heart failure: Secondary | ICD-10-CM | POA: Diagnosis not present

## 2020-12-18 DIAGNOSIS — A419 Sepsis, unspecified organism: Secondary | ICD-10-CM | POA: Diagnosis not present

## 2020-12-18 DIAGNOSIS — R6520 Severe sepsis without septic shock: Secondary | ICD-10-CM | POA: Diagnosis not present

## 2020-12-18 LAB — CBC WITH DIFFERENTIAL/PLATELET
Abs Immature Granulocytes: 0.72 10*3/uL — ABNORMAL HIGH (ref 0.00–0.07)
Basophils Absolute: 0.1 10*3/uL (ref 0.0–0.1)
Basophils Relative: 1 %
Eosinophils Absolute: 0.3 10*3/uL (ref 0.0–0.5)
Eosinophils Relative: 2 %
HCT: 32.9 % — ABNORMAL LOW (ref 39.0–52.0)
Hemoglobin: 10.4 g/dL — ABNORMAL LOW (ref 13.0–17.0)
Immature Granulocytes: 5 %
Lymphocytes Relative: 6 %
Lymphs Abs: 0.8 10*3/uL (ref 0.7–4.0)
MCH: 26.3 pg (ref 26.0–34.0)
MCHC: 31.6 g/dL (ref 30.0–36.0)
MCV: 83.1 fL (ref 80.0–100.0)
Monocytes Absolute: 1.6 10*3/uL — ABNORMAL HIGH (ref 0.1–1.0)
Monocytes Relative: 12 %
Neutro Abs: 10.1 10*3/uL — ABNORMAL HIGH (ref 1.7–7.7)
Neutrophils Relative %: 74 %
Platelets: 376 10*3/uL (ref 150–400)
RBC: 3.96 MIL/uL — ABNORMAL LOW (ref 4.22–5.81)
RDW: 19.1 % — ABNORMAL HIGH (ref 11.5–15.5)
WBC: 13.6 10*3/uL — ABNORMAL HIGH (ref 4.0–10.5)
nRBC: 0.4 % — ABNORMAL HIGH (ref 0.0–0.2)

## 2020-12-18 LAB — BASIC METABOLIC PANEL
Anion gap: 15 (ref 5–15)
BUN: 41 mg/dL — ABNORMAL HIGH (ref 8–23)
CO2: 27 mmol/L (ref 22–32)
Calcium: 8.2 mg/dL — ABNORMAL LOW (ref 8.9–10.3)
Chloride: 95 mmol/L — ABNORMAL LOW (ref 98–111)
Creatinine, Ser: 2.2 mg/dL — ABNORMAL HIGH (ref 0.61–1.24)
GFR, Estimated: 31 mL/min — ABNORMAL LOW (ref >60)
Glucose, Bld: 97 mg/dL (ref 70–99)
Potassium: 3.5 mmol/L (ref 3.5–5.1)
Sodium: 137 mmol/L (ref 135–145)

## 2020-12-18 LAB — GLUCOSE, CAPILLARY
Glucose-Capillary: 101 mg/dL — ABNORMAL HIGH (ref 70–99)
Glucose-Capillary: 100 mg/dL — ABNORMAL HIGH (ref 70–99)
Glucose-Capillary: 110 mg/dL — ABNORMAL HIGH (ref 70–99)
Glucose-Capillary: 150 mg/dL — ABNORMAL HIGH (ref 70–99)

## 2020-12-18 LAB — MAGNESIUM: Magnesium: 2.3 mg/dL (ref 1.7–2.4)

## 2020-12-18 MED ORDER — POTASSIUM CHLORIDE CRYS ER 20 MEQ PO TBCR
40.0000 meq | EXTENDED_RELEASE_TABLET | Freq: Once | ORAL | Status: AC
  Administered 2020-12-18: 40 meq via ORAL
  Filled 2020-12-18: qty 2

## 2020-12-18 NOTE — Progress Notes (Addendum)
Patient ID: Nicholas Caldwell, male   DOB: 10/21/1946, 75 y.o.   MRN: [id number]    Advanced Heart Failure Rounding Note  PCP-Cardiologist: No primary care provider on file.   Subjective:    SCr continues to trend up, 1.93>>2.13>>2.20. Good UOP w/ torsemide -2.3L out yesterday. Wt down another 4 lb.   Patient remains afebrile with WBCs 14. Finished 5 days of cefepime for PNA.    Underwent TEE/DCCV yesterday. Maintaining NSR.   Resting comfortably in bed. Feeling better. No complaints.    Objective:   Weight Range: 96.2 kg Body mass index is 28.76 kg/m.   Vital Signs:   Temp:  [98.3 F (36.8 C)-99.3 F (37.4 C)] 98.4 F (36.9 C) (02/15 0425) Pulse Rate:  [68-90] 68 (02/15 0425) Resp:  [16-27] 18 (02/15 0425) BP: (90-131)/(52-69) 113/59 (02/15 0425) SpO2:  [92 %-99 %] 95 % (02/15 0425) Last BM Date: 12/16/20  Weight change: Filed Weights   12/14/20 0408 12/16/20 0500 12/17/20 0500  Weight: 104.3 kg 98.2 kg 96.2 kg    Intake/Output:   Intake/Output Summary (Last 24 hours) at 12/18/2020 0751 Last data filed at 12/18/2020 0425 Gross per 24 hour  Intake 1430 ml  Output 2325 ml  Net -895 ml      Physical Exam    General:  Chronically ill, elderly male. No respiratory difficulty HEENT: normal Neck: supple.  JVD 7 cm. Carotids 2+ bilat; no bruits. No lymphadenopathy or thyromegaly appreciated. Cor: PMI nondisplaced. Regular rate & rhythm. No rubs, gallops or murmurs. Lungs: clear. No wheezing  Abdomen: soft, nontender, nondistended. No hepatosplenomegaly. No bruits or masses. Good bowel sounds. Extremities: no cyanosis, clubbing, rash, edema, warm  Neuro: alert & oriented x 3, cranial nerves grossly intact. moves all 4 extremities w/o difficulty. Affect pleasant.    Telemetry   NSR 70s, 6 beats NSVT (personally reviewed)   Labs    CBC Recent Labs    12/16/20 0118 12/18/20 0128  WBC 12.5* 13.6*  NEUTROABS 7.9* 10.1*  HGB 9.9* 10.4*  HCT 31.8* 32.9*  MCV  82.4 83.1  PLT 425* 665   Basic Metabolic Panel Recent Labs    12/16/20 0118 12/17/20 0222 12/18/20 0128  NA 142 142 137  K 3.4* 3.2* 3.5  CL 102 96* 95*  CO2 30 32 27  GLUCOSE 147* 80 97  BUN 49* 45* 41*  CREATININE 1.93* 2.13* 2.20*  CALCIUM 8.6* 8.6* 8.2*  MG 1.8  --  2.3   Liver Function Tests No results for input(s): AST, ALT, ALKPHOS, BILITOT, PROT, ALBUMIN in the last 72 hours. No results for input(s): LIPASE, AMYLASE in the last 72 hours. Cardiac Enzymes No results for input(s): CKTOTAL, CKMB, CKMBINDEX, TROPONINI in the last 72 hours.  BNP: BNP (last 3 results) Recent Labs    10/29/20 2252 11/21/20 1944 12/11/20 0730  BNP 2,913.5* 2,475.4* 1,858.3*    ProBNP (last 3 results) No results for input(s): PROBNP in the last 8760 hours.   D-Dimer No results for input(s): DDIMER in the last 72 hours. Hemoglobin A1C No results for input(s): HGBA1C in the last 72 hours. Fasting Lipid Panel No results for input(s): CHOL, HDL, LDLCALC, TRIG, CHOLHDL, LDLDIRECT in the last 72 hours. Thyroid Function Tests No results for input(s): TSH, T4TOTAL, T3FREE, THYROIDAB in the last 72 hours.  Invalid input(s): FREET3  Other results:   Imaging    ECHO TEE  Result Date: 12/17/2020    TRANSESOPHOGEAL ECHO REPORT   Patient Name:   VAL SCHIAVO  Date of Exam: 12/17/2020 Medical Rec #:  [id number]    Height:       72.0 in Accession #:    [id number]   Weight:       212.1 lb Date of Birth:  [birth year]    BSA:          2.184 m Patient Age:    49 years     BP:           119/54 mmHg Patient Gender: M            HR:           91 bpm. Exam Location:  Inpatient Procedure: Transesophageal Echo, Cardiac Doppler and Color Doppler Indications:     Atrial flibrillation/flutter  History:         Patient has prior history of Echocardiogram examinations, most                  recent 09/26/2020. CHF, CAD, COPD, Arrythmias:Atrial                  Fibrillation; Risk Factors:Hypertension, Diabetes,  Dyslipidemia                  and Former Smoker.  Sonographer:     Clayton Lefort RDCS (AE) Referring Phys:  [id number] AMY D CLEGG Diagnosing Phys: Loralie Champagne MD PROCEDURE: After discussion of the risks and benefits of a TEE, an informed consent was obtained from the patient. The transesophogeal probe was passed without difficulty through the esophogus of the patient. Local oropharyngeal anesthetic was provided with Cetacaine. Sedation performed by different physician. The patient was monitored while under deep sedation. Anesthestetic sedation was provided intravenously by Anesthesiology: 149.21m of Propofol, 1012mof Lidocaine. Image quality was adequate. The  patient developed no complications during the procedure. A successful direct current cardioversion was performed at 200 joules with 1 attempt. IMPRESSIONS  1. Left ventricular ejection fraction, by estimation, is 45 to 50%. The left ventricle has mildly decreased function. The left ventricle demonstrates global hypokinesis. There is mild left ventricular hypertrophy.  2. Right ventricular systolic function is severely reduced. The right ventricular size is severely enlarged. D-shaped interventricular septum suggestive of RV pressure/volume overload.  3. No left atrial/left atrial appendage thrombus was detected.  4. Right atrial size was moderately dilated.  5. The mitral valve is normal in structure. Trivial mitral valve regurgitation. No evidence of mitral stenosis.  6. The aortic valve is tricuspid. Aortic valve regurgitation is mild. No aortic stenosis is present.  7. No PFO or ASD by color doppler.  8. Peak RV-RA gradient 53 mmHg.  9. A small pericardial effusion is present. FINDINGS  Left Ventricle: Left ventricular ejection fraction, by estimation, is 45 to 50%. The left ventricle has mildly decreased function. The left ventricle demonstrates global hypokinesis. The left ventricular internal cavity size was normal in size. There is  mild left ventricular  hypertrophy. Right Ventricle: The right ventricular size is severely enlarged. No increase in right ventricular wall thickness. Right ventricular systolic function is severely reduced. Left Atrium: Left atrial size was normal in size. No left atrial/left atrial appendage thrombus was detected. Right Atrium: Right atrial size was moderately dilated. Pericardium: A small pericardial effusion is present. Mitral Valve: The mitral valve is normal in structure. Trivial mitral valve regurgitation. No evidence of mitral valve stenosis. Tricuspid Valve: The tricuspid valve is normal in structure. Tricuspid valve regurgitation is trivial. Aortic Valve: The aortic valve is tricuspid. Aortic valve  regurgitation is mild. No aortic stenosis is present. Pulmonic Valve: The pulmonic valve was normal in structure. Pulmonic valve regurgitation is trivial. No evidence of pulmonic stenosis. Aorta: The aortic root is normal in size and structure. IAS/Shunts: No PFO or ASD by color doppler.  TRICUSPID VALVE TR Peak grad:   52.7 mmHg TR Vmax:        363.00 cm/s Loralie Champagne MD Electronically signed by Loralie Champagne MD Signature Date/Time: 12/17/2020/3:46:44 PM    Final      Medications:     Scheduled Medications: . amiodarone  200 mg Oral BID  . apixaban  5 mg Oral BID  . atorvastatin  40 mg Oral Daily  . insulin aspart  0-15 Units Subcutaneous TID WC  . insulin aspart  0-5 Units Subcutaneous QHS  . lidocaine  1 patch Transdermal Q24H  . polyethylene glycol  17 g Oral Daily  . potassium chloride  40 mEq Oral Daily  . sodium chloride flush  3 mL Intravenous Q12H  . tamsulosin  0.4 mg Oral QPC breakfast  . torsemide  80 mg Oral Daily  . umeclidinium bromide  1 puff Inhalation Daily    Infusions:   PRN Medications: acetaminophen **OR** acetaminophen, albuterol, oxyCODONE-acetaminophen **AND** oxyCODONE    Assessment/Plan   1. ID: L-sided PNA on CXR, PCT elevated 4.5 initially.  He was admitted with sepsis.   Currently afebrile.  - Has completed 5 days of cefepime. Further management per primary service.  2.Chronic diastolic CHFwith prominent RV failure: Likely related to COPD/OSA/OHS.H/o poor compliance w/ home O2 and BiPAP.Echo in 11/21 withEF 50-55%, RV severely reduced w/ severely elevated RV systolic pressure, 68 mmHg.Poor po intake recently with nausea/vomiting, initially with AKI on CKD stage 3. Creatinine trending up, 1.93>>2.13>>2.20.  He is not particularly volume overloaded on exam.  He has prominent RV failure with tendency towards volume overload/CHF.  - On torsemide 80 mg bid at home, continue torsemide 80 mg daily and replace K.  - monitor renal function  3. Chronic Hypoxic/Hypercarbic Respiratory failure: Due toCHF + COPD/OSA/OHS w/ poor compliance w/ home O2 and BiPAP. Now also with PNA.  -Continue daytime O2 and BiPAP during sleep. 4. AKI onStage3CKD: Baseline creatinine 1.8, up to 3.56 this admission, 2.20 today  - Follow closely.   5.Atrial fibrillation/flutter:Paroxysmal. He has history of CVA. He had DCCV in 3/20 and again in 5/20. He failed amiodarone in the past and cannot take Tikosyn due to baseline prolonged QT interval. However, he has been back on amiodarone and was in NSR when I last saw him.  This admission, he is back in atrial flutter with controlled rate. Underwent successful TEE/DCCV 2/14. Maintaining NSR today.  - Continue Eliquis.  - Continue  amiodarone 200 mg bid (dose increased).  6. COPD:No longer smokes. Continue homeoxygen.  - Now off Solumedrol.   7.OHS/OSA:Continue oxygen during the day andBIPAP at night.  8.CAD:Nonobstructive disease in 2/20. No chest pain.  - no ASA due to anticoagulation.  -Atorvastatin.  9. Hypokalemia: 3.5 today  - continue daily KCl  10. Deconditioning: PT/OT. Out of bed and ambulate   Length of Stay: [address], PA-C  12/18/2020, 7:51 AM  Advanced Heart Failure Team Pager [phone] (M-F;  7a - 4p)  Please contact [facility] for night-coverage after hours (4p -7a ) and weekends on [email or web address]  Patient seen with PA, agree with the above note.   TEE yesterday showed EF 50%, severely dilated and dysfunctional RV with D-shaped septum and  peak RV-RA gradient 53 mmHg.  He had successful DCCV to NSR.    He remains in NSR today, no complaints.   General: NAD Neck: No JVD, no thyromegaly or thyroid nodule.  Lungs: Clear to auscultation bilaterally with normal respiratory effort. CV: Nondisplaced PMI.  Heart regular S1/S2, no S3/S4, no murmur.  No peripheral edema.   Abdomen: Soft, nontender, no hepatosplenomegaly, no distention.  Skin: Intact without lesions or rashes.  Neurologic: Alert and oriented x 3.  Psych: Normal affect. Extremities: No clubbing or cyanosis.  HEENT: Normal.   He is maintaining NSR.  Continue amiodarone 200 mg bid for total of 2 wks then back to once daily.  Continue Eliquis.   BUN/creatinine stable today.  He is on torsemide 80 mg once daily (was on bid at home).  Volume status looks ok, would continue this dose for now.   He has completed abx for PNA.    Deconditioned, continue work with PT.   Think he should be ready to leave the hospital soon depending on mobility.   Loralie Champagne 12/18/2020 8:41 AM

## 2020-12-18 NOTE — Evaluation (Signed)
Occupational Therapy Evaluation Patient Details Name: Nicholas Caldwell MRN: [id number] DOB: [birth year] Today's Date: 12/18/2020    History of Present Illness Pt is a 75 y/o male admitted secondary to Abdominal pain and severe abdominal distention Secondary to constipation due to chronic use of narcotics. Pt also found to have severe sepsis secondary to L sided PNA. PMH includes CHF, CAD, a fib, COPD, and DM.   Clinical Impression   Pt presents with decline in function and safety with ADLs and ADL mobility with impaired strength, balance and endurance. Pt lives at home with his wife and requires assist with LB ADLs at baseline. Pt would benefit from acute OT services to address impairments to maximize level of function and safety. Pt adamantly refusing ST rehab at SNF    Follow Up Recommendations  Supervision/Assistance - 24 hour;Home health OT;Other (comment) (pt adamantly refusing SNF)    Equipment Recommendations  None recommended by OT    Recommendations for Other Services       Precautions / Restrictions Precautions Precautions: Fall Precaution Comments: monitor O2 sats, HR, BP Restrictions Weight Bearing Restrictions: No      Mobility Bed Mobility Overal bed mobility: Needs Assistance Bed Mobility: Supine to Sit;Sit to Supine Rolling: Min guard   Supine to sit: Mod assist Sit to supine: Mod assist   General bed mobility comments: mod A to elevate trunk and for LEs back onto bed    Transfers Overall transfer level: Needs assistance Equipment used: 1 person hand held assist Transfers: Lateral/Scoot Transfers          Lateral/Scoot Transfers: Mod assist General transfer comment: declined OOB due to dizzy headed feeling    Balance Overall balance assessment: Needs assistance Sitting-balance support: No upper extremity supported Sitting balance-Leahy Scale: Fair                                     ADL either performed or assessed with clinical  judgement   ADL Overall ADL's : Needs assistance/impaired Eating/Feeding: Independent;Set up;Sitting   Grooming: Wash/dry hands;Wash/dry face;Min guard;Sitting   Upper Body Bathing: Min guard;Sitting Upper Body Bathing Details (indicate cue type and reason): simulated seated EOB Lower Body Bathing: Maximal assistance;Sitting/lateral leans Lower Body Bathing Details (indicate cue type and reason): simulated Upper Body Dressing : Min guard;Sitting   Lower Body Dressing: Total assistance Lower Body Dressing Details (indicate cue type and reason): total A to don socks   Toilet Transfer Details (indicate cue type and reason): mod A sit - stand form EOB, SPT towards  recliner, but refused to sit in recliner Toileting- Clothing Manipulation and Hygiene: Maximal assistance;Sit to/from stand       Functional mobility during ADLs: Moderate assistance;Rolling walker       Vision Patient Visual Report: No change from baseline       Perception     Praxis      Pertinent Vitals/Pain Pain Assessment: Faces Pain Score: 8  Faces Pain Scale: Hurts even more Pain Location: R hip Pain Descriptors / Indicators: Aching Pain Intervention(s): Limited activity within patient's tolerance;Monitored during session;Repositioned     Hand Dominance Right   Extremity/Trunk Assessment Upper Extremity Assessment Upper Extremity Assessment: Generalized weakness   Lower Extremity Assessment Lower Extremity Assessment: Defer to PT evaluation   Cervical / Trunk Assessment Cervical / Trunk Assessment: Kyphotic   Communication Communication Communication: No difficulties   Cognition Arousal/Alertness: Awake/alert Behavior During Therapy: [facility] for  tasks assessed/performed Overall Cognitive Status: History of cognitive impairments - at baseline                                 General Comments: pt was more alert but also more verbally descriptive about what was happening, including  dizziness   General Comments  Pt was note dto have pulse 71 and sat 93% while sitting and dizzy, but was not able to walk away from pt to get BP cart to check BP    Exercises Exercises: General Lower Extremity Total Joint Exercises Straight Leg Raises: AROM;AAROM;10 reps General Exercises - Lower Extremity Ankle Circles/Pumps: AAROM;10 reps Quad Sets: AROM;10 reps Heel Slides: AROM;10 reps Hip ABduction/ADduction: AAROM;10 reps Hip Flexion/Marching: AAROM;10 reps   Shoulder Instructions      Home Living Family/patient expects to be discharged to:: Private residence Living Arrangements: Spouse/significant other Available Help at Discharge: Family;Available 24 hours/day Type of Home: House Home Access: Stairs to enter [employer] of Steps: 1 Entrance Stairs-Rails: None Home Layout: One level     Bathroom Shower/Tub: Occupational psychologist: Standard     Home Equipment: Environmental consultant - 2 wheels;Wheelchair - manual;Bedside commode;Grab bars - tub/shower;Shower seat;Electric scooter;Walker - 4 wheels          Prior Functioning/Environment Level of Independence: Needs assistance  Gait / Transfers Assistance Needed: Has been able to stand and transfer to [place] using RW. ADL's / Homemaking Assistance Needed: Spouse assists with LB ADLs            OT Problem List: Decreased strength;Impaired balance (sitting and/or standing);Decreased activity tolerance;Pain;Decreased cognition;Decreased knowledge of use of DME or AE      OT Treatment/Interventions: Self-care/ADL training;Patient/family education;Therapeutic exercise;Therapeutic activities;Balance training    OT Goals(Current goals can be found in the care plan section) Acute Rehab OT Goals Patient Stated Goal: to go home OT Goal Formulation: With patient Time For Goal Achievement: 01/01/21 Potential to Achieve Goals: Good ADL Goals Pt Will Perform Grooming: with supervision;with set-up;sitting Pt Will  Perform Upper Body Bathing: with supervision;with set-up;sitting Pt Will Perform Upper Body Dressing: with supervision;with set-up;sitting Pt Will Transfer to Toilet: with mod assist;with min assist;stand pivot transfer;bedside commode Pt Will Perform Toileting - Clothing Manipulation and hygiene: with mod assist;with min assist;sit to/from stand  OT Frequency: Min 2X/week   Barriers to D/C:            Co-evaluation              AM-PAC OT "6 Clicks" Daily Activity     Outcome Measure Help from another person eating meals?: None Help from another person taking care of personal grooming?: A Little Help from another person toileting, which includes using toliet, bedpan, or urinal?: Total Help from another person bathing (including washing, rinsing, drying)?: A Lot Help from another person to put on and taking off regular upper body clothing?: A Little Help from another person to put on and taking off regular lower body clothing?: Total 6 Click Score: 14   End of Session Equipment Utilized During Treatment: Gait belt;Rolling walker  Activity Tolerance: Patient limited by fatigue Patient left: in bed;with call bell/phone within reach;with bed alarm set  OT Visit Diagnosis: Unsteadiness on feet (R26.81);Other abnormalities of gait and mobility (R26.89);Muscle weakness (generalized) (M62.81);Pain Pain - Right/Left: Right Pain - part of body: Hip  Time: 4255-2589 OT Time Calculation (min): 20 min Charges:  OT General Charges $OT Visit: 1 Visit OT Evaluation $OT Eval Moderate Complexity: 1 Mod    Britt Bottom 12/18/2020, 4:08 PM

## 2020-12-18 NOTE — Progress Notes (Signed)
Physical Therapy Treatment Patient Details Name: Nicholas Caldwell MRN: [id number] DOB: 07-Mar-1946 Today's Date: 12/18/2020    History of Present Illness Pt is a 75 y/o male admitted secondary to Abdominal pain and severe abdominal distention Secondary to constipation due to chronic use of narcotics. Pt also found to have severe sepsis secondary to L sided PNA. PMH includes CHF, CAD, a fib, COPD, and DM.    PT Comments    Pt was seen for bedside visit, unable to get to chair due to his light headed feelings at sitting.  He is appropriate for Orthostatics, but did not have access to a cart once he was up and symptomatic.  Follow acutely for his PT goals and focus on OOB to chair, standing as tolerated and control of gait when able.  Pt had successful TEE yesterday with NSR noted on chart.     Follow Up Recommendations  Home health PT;Supervision/Assistance - 24 hour     Equipment Recommendations  Other (comment) (Will determine at DC)    Recommendations for Other Services       Precautions / Restrictions Precautions Precautions: Fall Precaution Comments: monitor O2 sats, HR, BP Restrictions Weight Bearing Restrictions: No    Mobility  Bed Mobility Overal bed mobility: Needs Assistance Bed Mobility: Supine to Sit;Sit to Supine Rolling: Min guard   Supine to sit: Mod assist Sit to supine: Mod assist   General bed mobility comments: mod assist to get legs on and off bed    Transfers Overall transfer level: Needs assistance Equipment used: 1 person hand held assist Transfers: Lateral/Scoot Transfers          Lateral/Scoot Transfers: Mod assist General transfer comment: declined OOB due to dizzy headed feeling  Ambulation/Gait             General Gait Details: unable to stand   Stairs             Wheelchair Mobility    Modified Rankin (Stroke Patients Only)       Balance Overall balance assessment: Needs assistance Sitting-balance support: No upper  extremity supported Sitting balance-Leahy Scale: Fair                                      Cognition Arousal/Alertness: Awake/alert Behavior During Therapy: WFL for tasks assessed/performed Overall Cognitive Status: History of cognitive impairments - at baseline                                 General Comments: pt was more alert but also more verbally descriptive about what was happening, including dizziness      Exercises Total Joint Exercises Straight Leg Raises: AROM;AAROM;10 reps General Exercises - Lower Extremity Ankle Circles/Pumps: AAROM;10 reps Quad Sets: AROM;10 reps Heel Slides: AROM;10 reps Hip ABduction/ADduction: AAROM;10 reps Hip Flexion/Marching: AAROM;10 reps    General Comments General comments (skin integrity, edema, etc.): Pt was note dto have pulse 71 and sat 93% while sitting and dizzy, but was not able to walk away from pt to get BP cart to check BP      Pertinent Vitals/Pain Pain Assessment: 0-10 Pain Score: 8  Pain Location: R hip Pain Descriptors / Indicators: Aching Pain Intervention(s): Limited activity within patient's tolerance;Monitored during session;Repositioned;Patient requesting pain meds-RN notified    Home Living  Prior Function            PT Goals (current goals can now be found in the care plan section) Acute Rehab PT Goals Patient Stated Goal: to go home Progress towards PT goals: Not progressing toward goals - comment    Frequency    Min 3X/week      PT Plan Current plan remains appropriate    Co-evaluation              AM-PAC PT "6 Clicks" Mobility   Outcome Measure  Help needed turning from your back to your side while in a flat bed without using bedrails?: A Little Help needed moving from lying on your back to sitting on the side of a flat bed without using bedrails?: A Lot Help needed moving to and from a bed to a chair (including a wheelchair)?:  A Lot Help needed standing up from a chair using your arms (e.g., wheelchair or bedside chair)?: A Lot Help needed to walk in hospital room?: Total Help needed climbing 3-5 steps with a railing? : Total 6 Click Score: 11    End of Session Equipment Utilized During Treatment: Oxygen Activity Tolerance: Patient tolerated treatment well Patient left: in bed;with call bell/phone within reach;with bed alarm set Nurse Communication: Mobility status PT Visit Diagnosis: Unsteadiness on feet (R26.81);Muscle weakness (generalized) (M62.81);Difficulty in walking, not elsewhere classified (R26.2)     Time: 5797-2820 PT Time Calculation (min) (ACUTE ONLY): 39 min  Charges:                     Ramond Dial 12/18/2020, 3:36 PM Mee Hives, PT MS Acute Rehab Dept. Number: [place]

## 2020-12-18 NOTE — Progress Notes (Signed)
PROGRESS NOTE    Nicholas Caldwell   IHK:[id number]  DOB: February 02, 1946  DOA: 12/11/2020 PCP: [name], MD   Brief Narrative:  Nicholas Caldwell is a 75 yo male with PMH chronic dCHF, CKD4, afib (on Eliquis), COPD (on chronic 3-4 L O2), OHS/OSA, HTN, HLD, pulmonary HTN, DMII who presented to the ER with abdominal distention/bloating and pain that had been going on for approximately 2 weeks.  He also had associated nausea/vomiting.  He was very lethargic in the ER but able to provide some information.  He endorsed no chest pain or worsening shortness of breath despite imaging findings and only endorsed a mild nonproductive cough. He states he is on diuretics at home but states he was not taking them recently; he has also had poor appetite.  In the ED: Chest x-ray reveals large left-sided consolidation concerning for pneumonia. CT of the abdomen pelvis reveals a large stool burden with a 7 cm stool ball in the rectum. BUN 138, creatinine 3.96, potassium 2.5 WBC count 16.4 Covid negative  The patient received vancomycin and cefepime in the ED and was admitted to the hospital. Patient was hospitalized this past January and released on 11/27/2020 after being treated for acute on chronic heart failure.   Subjective: Patient seen and examined.  No complaints  Assessment & Plan:   Principal Problem:   Severe sepsis -Secondary to left sided pneumonia  -improving -He was recently hospitalized, he is being treated as hospital-acquired infection -WBC 16.4  >>> 18.8 >> 11.8t >> 12.5 today -Procalcitonin 4.50-has improved to 3.22 >>> 1.52 - MRSA PCR is negative- stop Vanc -Blood Cultures --- reviewed, NGTD -Steroid has been discontinued , since no signs of COPD exacerbation  -Signs and symptoms of sepsis has resolved patient remained stable.  Completed 5 days of cefepime and Flagyl on 12/17/2020.  Active Problems:  Acute renal failure superimposed on stage IIIb -Creatinine improving 2.49, 2.30,  2.07 >>1.90 >> 1.93 > 2.13> 2.20 -Likely due to poor p.o. intake -Responding to IV fluid>>> discontinued  12/15/20 -Cardiology resumed diuretics -Appreciate cardiology involvement.  Creatinine with slight jump again today.  Monitor tomorrow.  Would like to see at least a downtrend before we plan to discharge.  Abdominal pain and severe abdominal distention -Much improved positive bowel movements -Secondary to constipation due to chronic use of narcotics,  Hypokalemia Resolved  Chronic pain -He states his pain is chronically present in his back and his hips -Home dose of oxycodone/acetaminophen 10 mg every 6 hours as needed has been resumed -The patient and his wife has been informed regarding his chronic narcotic use which is related to his constipation    PAF (paroxysmal atrial fibrillation)  Underwent successful DCCV on 12/17/2020.  Remains in sinus rhythm.  Continue amiodarone and Eliquis.    COPD (chronic obstructive pulmonary disease) (HCC)   Chronic respiratory failure with hypoxia  -Per patient is on 3-3.5 L of oxygen at home -Remained stable on 3 L of oxygen satting 95%    Diabetes mellitus type 2 in obese [facility]) -Patient takes 70/30 at home -He is currently on a sliding scale.  Blood sugar slightly labile.  BPH -Continue Flomax  Right lower lobe pulmonary nodule -Will need outpatient follow-up  Chronicdiastolic CHFwith prominent RV failure:  In the setting of COPD/OSA/OHS. H/o poor compliance w/ home O2 and BiPAP. Echo in 11/21 withEF 50-55% -Cardiology heart failure team following -Resumed torsemide 80 mg p.o. daily (reduced from twice daily) -Monitoring daily weight  Time spent in minutes:  34 minutes DVT prophylaxis: Eliquis Code Status: Full code Family Communication: Discussed with patient.  No family present. level of Care: Level of care: Telemetry Medical Disposition Plan:  Status is: Inpatient  Remains inpatient appropriate because:IV treatments  appropriate due to intensity of illness or inability to take PO   Dispo: The patient is from: Home              Anticipated d/c is to: Home (home health)              Anticipated d/c date is: in a.m. if remains stable              Patient currently is not medically stable to d/c.   Difficult to place patient No      Consultants:   Cardiology Procedures:   None Antimicrobials:  Anti-infectives (From admission, onward)   Start     Dose/Rate Route Frequency Ordered Stop   12/13/20 1730  vancomycin (VANCOREADY) IVPB 1250 mg/250 mL  Status:  Discontinued        1,250 mg 166.7 mL/hr over 90 Minutes Intravenous Every 48 hours 12/12/20 0902 12/13/20 0837   12/13/20 1600  metroNIDAZOLE (FLAGYL) tablet 500 mg  Status:  Discontinued        500 mg Oral Every 8 hours 12/13/20 0911 12/17/20 1339   12/13/20 1000  ceFEPIme (MAXIPIME) 2 g in sodium chloride 0.9 % 100 mL IVPB  Status:  Discontinued        2 g 200 mL/hr over 30 Minutes Intravenous Every 12 hours 12/13/20 0837 12/17/20 1339   12/13/20 0930  vancomycin (VANCOREADY) IVPB 750 mg/150 mL  Status:  Discontinued        750 mg 150 mL/hr over 60 Minutes Intravenous Every 24 hours 12/13/20 0837 12/13/20 1203   12/12/20 1500  ceFEPIme (MAXIPIME) 2 g in sodium chloride 0.9 % 100 mL IVPB  Status:  Discontinued        2 g 200 mL/hr over 30 Minutes Intravenous Every 24 hours 12/11/20 1503 12/13/20 0837   12/11/20 1600  vancomycin (VANCOREADY) IVPB 1000 mg/200 mL  Status:  Discontinued        1,000 mg 200 mL/hr over 60 Minutes Intravenous  Once 12/11/20 1547 12/11/20 1553   12/11/20 1600  ceFEPIme (MAXIPIME) 2 g in sodium chloride 0.9 % 100 mL IVPB  Status:  Discontinued        2 g 200 mL/hr over 30 Minutes Intravenous  Once 12/11/20 1547 12/11/20 1553   12/11/20 1600  metroNIDAZOLE (FLAGYL) IVPB 500 mg  Status:  Discontinued        500 mg 100 mL/hr over 60 Minutes Intravenous Every 8 hours 12/11/20 1550 12/13/20 0911   12/11/20 1509   vancomycin variable dose per unstable renal function (pharmacist dosing)  Status:  Discontinued         Does not apply See admin instructions 12/11/20 1509 12/12/20 0902   12/11/20 1500  vancomycin (VANCOCIN) 2,500 mg in sodium chloride 0.9 % 500 mL IVPB        2,500 mg 250 mL/hr over 120 Minutes Intravenous  Once 12/11/20 1459 12/11/20 1919   12/11/20 1500  ceFEPIme (MAXIPIME) 2 g in sodium chloride 0.9 % 100 mL IVPB        2 g 200 mL/hr over 30 Minutes Intravenous  Once 12/11/20 1459 12/11/20 1718       Objective: Vitals:   12/17/20 1334 12/17/20 2001 12/18/20 0425 12/18/20 0845  BP: 114/69 113/65 Marland Kitchen)  113/59   Pulse: 84 75 68   Resp: _0 Temp: 98.3 F (36.8 C) 98.4 F (36.9 C) 98.4 F (36.9 C)   TempSrc:  Oral Oral   SpO2: 97% 95% 95% 96%  Weight:      Height:        Intake/Output Summary (Last 24 hours) at 12/18/2020 1240 Last data filed at 12/18/2020 1100 Gross per 24 hour  Intake 1320 ml  Output 2050 ml  Net -730 ml   Filed Weights   12/14/20 0408 12/16/20 0500 12/17/20 0500  Weight: 104.3 kg 98.2 kg 96.2 kg   General exam: Appears calm and comfortable  Respiratory system: Clear to auscultation. Respiratory effort normal. Cardiovascular system: S1 & S2 heard, RRR. No JVD, murmurs, rubs, gallops or clicks. No pedal edema. Gastrointestinal system: Abdomen is nondistended, soft and nontender. No organomegaly or masses felt. Normal bowel sounds heard. Central nervous system: Alert and oriented. No focal neurological deficits. Extremities: Symmetric 5 x 5 power. Skin: No rashes, lesions or ulcers.  Psychiatry: Judgement and insight appear normal. Mood & affect appropriate.    Wounds: Please see nursing documentation  Pressure Injury 11/22/20 Sacrum Right Stage 2 -  Partial thickness loss of dermis presenting as a shallow open injury with a red, pink wound bed without slough. 3x1 cm (Active)  11/22/20 0336  Location: Sacrum  Location Orientation: Right   Staging: Stage 2 -  Partial thickness loss of dermis presenting as a shallow open injury with a red, pink wound bed without slough.  Wound Description (Comments): 3x1 cm  Present on Admission: Yes           Data Reviewed: I have personally reviewed following labs and imaging studies  CBC: Recent Labs  Lab 12/13/20 0505 12/14/20 0021 12/15/20 0115 12/16/20 0118 12/18/20 0128  WBC 14.8* 18.8* 11.8* 12.5* 13.6*  NEUTROABS 13.0* 14.3* 7.5 7.9* 10.1*  HGB 9.8* 9.9* 9.6* 9.9* 10.4*  HCT 31.1* 31.4* 31.0* 31.8* 32.9*  MCV 83.4 84.4 83.8 82.4 83.1  PLT 394 399 384 425* 969   Basic Metabolic Panel: Recent Labs  Lab 12/13/20 0505 12/13/20 0726 12/14/20 0021 12/15/20 0115 12/16/20 0118 12/17/20 0222 12/18/20 0128  NA  --    < > 142 144 142 142 137  K  --    < > 3.5 3.3* 3.4* 3.2* 3.5  CL  --    < > 106 106 102 96* 95*  CO2  --    < > _1 32 27  GLUCOSE  --    < > 117* 181* 147* 80 97  BUN  --    < > 86* 63* 49* 45* 41*  CREATININE  --    < > 2.07* 1.90* 1.93* 2.13* 2.20*  CALCIUM  --    < > 8.7* 8.6* 8.6* 8.6* 8.2*  MG 2.7*  --  2.6* 2.2 1.8  --  2.3   < > = values in this interval not displayed.   GFR: Estimated Creatinine Clearance: 36.5 mL/min (A) (by C-G formula based on SCr of 2.2 mg/dL (H)). Liver Function Tests: No results for input(s): AST, ALT, ALKPHOS, BILITOT, PROT, ALBUMIN in the last 168 hours. No results for input(s): LIPASE, AMYLASE in the last 168 hours. No results for input(s): AMMONIA in the last 168 hours. Coagulation Profile: Recent Labs  Lab 12/12/20 0419  INR 1.6*   CBG: Recent Labs  Lab 12/17/20 1324 12/17/20 1732 12/17/20 2014 12/18/20 0727 12/18/20  1126  GLUCAP 138* 96 102* 101* 100*   Urine analysis:    Component Value Date/Time   COLORURINE YELLOW 12/11/2020 1107   APPEARANCEUR HAZY (A) 12/11/2020 1107   LABSPEC 1.020 12/11/2020 1107   PHURINE 5.5 12/11/2020 1107   GLUCOSEU NEGATIVE 12/11/2020 1107   HGBUR NEGATIVE  12/11/2020 1107   BILIRUBINUR NEGATIVE 12/11/2020 1107   KETONESUR NEGATIVE 12/11/2020 1107   PROTEINUR NEGATIVE 12/11/2020 1107   UROBILINOGEN 0.2 10/26/2014 2206   NITRITE NEGATIVE 12/11/2020 1107   LEUKOCYTESUR NEGATIVE 12/11/2020 1107   Sepsis Labs: _0 (procalcitonin:4,lacticidven:4) ) Recent Results (from the past 240 hour(s))  Culture, blood (single)     Status: None   Collection Time: 12/11/20  2:58 PM   Specimen: BLOOD  Result Value Ref Range Status   Specimen Description BLOOD SITE NOT SPECIFIED  Final   Special Requests   Final    BOTTLES DRAWN AEROBIC AND ANAEROBIC Blood Culture adequate volume   Culture   Final    NO GROWTH 5 DAYS Performed at [facility], [place] [address] [place], [place] [address]    Report Status 12/16/2020 FINAL  Final  MRSA PCR Screening     Status: None   Collection Time: 12/13/20  7:48 AM   Specimen: Nasal Mucosa; Nasopharyngeal  Result Value Ref Range Status   MRSA by PCR NEGATIVE NEGATIVE Final    Comment:        The GeneXpert MRSA Assay (FDA approved for NASAL specimens only), is one component of a comprehensive MRSA colonization surveillance program. It is not intended to diagnose MRSA infection nor to guide or monitor treatment for MRSA infections. Performed at [facility], [place] [address], [place], [place] [address]       Radiology Studies: ECHO TEE  Result Date: 12/17/2020    TRANSESOPHOGEAL ECHO REPORT   Patient Name:   Nicholas Caldwell Date of Exam: 12/17/2020 Medical Rec #:  [id number]    Height:       72.0 in Accession #:    [id number]   Weight:       212.1 lb Date of Birth:  1946/10/11    BSA:          2.184 m Patient Age:    35 years     BP:           119/54 mmHg Patient Gender: M            HR:           91 bpm. Exam Location:  Inpatient Procedure: Transesophageal Echo, Cardiac Doppler and Color Doppler Indications:     Atrial flibrillation/flutter  History:         Patient has prior history of  Echocardiogram examinations, most                  recent 09/26/2020. CHF, CAD, COPD, Arrythmias:Atrial                  Fibrillation; Risk Factors:Hypertension, Diabetes, Dyslipidemia                  and Former Smoker.  Sonographer:     Clayton Lefort RDCS (AE) Referring Phys:  [id number] AMY D CLEGG Diagnosing Phys: Loralie Champagne MD PROCEDURE: After discussion of the risks and benefits of a TEE, an informed consent was obtained from the patient. The transesophogeal probe was passed without difficulty through the esophogus of the patient. Local oropharyngeal anesthetic was provided with Cetacaine. Sedation performed by  different physician. The patient was monitored while under deep sedation. Anesthestetic sedation was provided intravenously by Anesthesiology: 149.9m of Propofol, 1052mof Lidocaine. Image quality was adequate. The  patient developed no complications during the procedure. A successful direct current cardioversion was performed at 200 joules with 1 attempt. IMPRESSIONS  1. Left ventricular ejection fraction, by estimation, is 45 to 50%. The left ventricle has mildly decreased function. The left ventricle demonstrates global hypokinesis. There is mild left ventricular hypertrophy.  2. Right ventricular systolic function is severely reduced. The right ventricular size is severely enlarged. D-shaped interventricular septum suggestive of RV pressure/volume overload.  3. No left atrial/left atrial appendage thrombus was detected.  4. Right atrial size was moderately dilated.  5. The mitral valve is normal in structure. Trivial mitral valve regurgitation. No evidence of mitral stenosis.  6. The aortic valve is tricuspid. Aortic valve regurgitation is mild. No aortic stenosis is present.  7. No PFO or ASD by color doppler.  8. Peak RV-RA gradient 53 mmHg.  9. A small pericardial effusion is present. FINDINGS  Left Ventricle: Left ventricular ejection fraction, by estimation, is 45 to 50%. The left ventricle has  mildly decreased function. The left ventricle demonstrates global hypokinesis. The left ventricular internal cavity size was normal in size. There is  mild left ventricular hypertrophy. Right Ventricle: The right ventricular size is severely enlarged. No increase in right ventricular wall thickness. Right ventricular systolic function is severely reduced. Left Atrium: Left atrial size was normal in size. No left atrial/left atrial appendage thrombus was detected. Right Atrium: Right atrial size was moderately dilated. Pericardium: A small pericardial effusion is present. Mitral Valve: The mitral valve is normal in structure. Trivial mitral valve regurgitation. No evidence of mitral valve stenosis. Tricuspid Valve: The tricuspid valve is normal in structure. Tricuspid valve regurgitation is trivial. Aortic Valve: The aortic valve is tricuspid. Aortic valve regurgitation is mild. No aortic stenosis is present. Pulmonic Valve: The pulmonic valve was normal in structure. Pulmonic valve regurgitation is trivial. No evidence of pulmonic stenosis. Aorta: The aortic root is normal in size and structure. IAS/Shunts: No PFO or ASD by color doppler.  TRICUSPID VALVE TR Peak grad:   52.7 mmHg TR Vmax:        363.00 cm/s DaLoralie ChampagneD Electronically signed by DaLoralie ChampagneD Signature Date/Time: 12/17/2020/3:46:44 PM    Final      Scheduled Meds: . amiodarone  200 mg Oral BID  . apixaban  5 mg Oral BID  . atorvastatin  40 mg Oral Daily  . insulin aspart  0-15 Units Subcutaneous TID WC  . insulin aspart  0-5 Units Subcutaneous QHS  . lidocaine  1 patch Transdermal Q24H  . polyethylene glycol  17 g Oral Daily  . potassium chloride  40 mEq Oral Daily  . potassium chloride  40 mEq Oral Once  . sodium chloride flush  3 mL Intravenous Q12H  . tamsulosin  0.4 mg Oral QPC breakfast  . torsemide  80 mg Oral Daily  . umeclidinium bromide  1 puff Inhalation Daily   Continuous Infusions:    LOS: 7 days    RaDarliss CheneyMD [facility] Pager: [email or web address] 12/18/2020, 12:40 PM

## 2020-12-19 ENCOUNTER — Other Ambulatory Visit: Payer: Self-pay | Admitting: Family Medicine

## 2020-12-19 ENCOUNTER — Encounter (HOSPITAL_COMMUNITY): Payer: Self-pay | Admitting: Cardiology

## 2020-12-19 DIAGNOSIS — R6520 Severe sepsis without septic shock: Secondary | ICD-10-CM | POA: Diagnosis not present

## 2020-12-19 DIAGNOSIS — A419 Sepsis, unspecified organism: Secondary | ICD-10-CM | POA: Diagnosis not present

## 2020-12-19 LAB — CBC WITH DIFFERENTIAL/PLATELET
Abs Immature Granulocytes: 0.35 10*3/uL — ABNORMAL HIGH (ref 0.00–0.07)
Basophils Absolute: 0 10*3/uL (ref 0.0–0.1)
Basophils Relative: 0 %
Eosinophils Absolute: 0.2 10*3/uL (ref 0.0–0.5)
Eosinophils Relative: 2 %
HCT: 34.7 % — ABNORMAL LOW (ref 39.0–52.0)
Hemoglobin: 10.4 g/dL — ABNORMAL LOW (ref 13.0–17.0)
Immature Granulocytes: 3 %
Lymphocytes Relative: 5 %
Lymphs Abs: 0.7 10*3/uL (ref 0.7–4.0)
MCH: 25.2 pg — ABNORMAL LOW (ref 26.0–34.0)
MCHC: 30 g/dL (ref 30.0–36.0)
MCV: 84.2 fL (ref 80.0–100.0)
Monocytes Absolute: 1.6 10*3/uL — ABNORMAL HIGH (ref 0.1–1.0)
Monocytes Relative: 12 %
Neutro Abs: 10.5 10*3/uL — ABNORMAL HIGH (ref 1.7–7.7)
Neutrophils Relative %: 78 %
Platelets: 352 10*3/uL (ref 150–400)
RBC: 4.12 MIL/uL — ABNORMAL LOW (ref 4.22–5.81)
RDW: 19.1 % — ABNORMAL HIGH (ref 11.5–15.5)
WBC: 13.4 10*3/uL — ABNORMAL HIGH (ref 4.0–10.5)
nRBC: 0.2 % (ref 0.0–0.2)

## 2020-12-19 LAB — BASIC METABOLIC PANEL
Anion gap: 13 (ref 5–15)
BUN: 37 mg/dL — ABNORMAL HIGH (ref 8–23)
CO2: 27 mmol/L (ref 22–32)
Calcium: 8.5 mg/dL — ABNORMAL LOW (ref 8.9–10.3)
Chloride: 98 mmol/L (ref 98–111)
Creatinine, Ser: 2.14 mg/dL — ABNORMAL HIGH (ref 0.61–1.24)
GFR, Estimated: 32 mL/min — ABNORMAL LOW (ref >60)
Glucose, Bld: 127 mg/dL — ABNORMAL HIGH (ref 70–99)
Potassium: 3.5 mmol/L (ref 3.5–5.1)
Sodium: 138 mmol/L (ref 135–145)

## 2020-12-19 LAB — GLUCOSE, CAPILLARY: Glucose-Capillary: 122 mg/dL — ABNORMAL HIGH (ref 70–99)

## 2020-12-19 MED ORDER — TORSEMIDE 40 MG PO TABS: 80.0000 mg | ORAL_TABLET | Freq: Every day | ORAL | 0 refills | Status: DC

## 2020-12-19 MED ORDER — APIXABAN 5 MG PO TABS: 5.0000 mg | ORAL_TABLET | Freq: Two times a day (BID) | ORAL | 0 refills | Status: DC

## 2020-12-19 MED ORDER — AMIODARONE HCL 200 MG PO TABS: ORAL_TABLET | ORAL | 0 refills | Status: DC

## 2020-12-19 MED FILL — TORSEMIDE 20 MG TABLET: 20 | 30 days supply | Qty: 120 | Fill #0

## 2020-12-19 MED FILL — AMIODARONE HCL 200 MG TAB: 200 | 30 days supply | Qty: 45 | Fill #0

## 2020-12-19 NOTE — Discharge Summary (Addendum)
Physician Discharge Summary  Nicholas Caldwell:[id number] DOB: [birth year] DOA: 12/11/2020  PCP: [name], MD  Admit date: 12/11/2020 Discharge date: 12/19/2020 30 Day Unplanned Readmission Risk Score   Flowsheet Row ED to Hosp-Admission (Current) from 12/11/2020 in [facility]  30 Day Unplanned Readmission Risk Score (%) 39.42 Filed at 12/19/2020 0801     This score is the patient's risk of an unplanned readmission within 30 days of being discharged (0 -100%). The score is based on dignosis, age, lab data, medications, orders, and past utilization.   Low:  0-14.9   Medium: 15-21.9   High: 22-29.9   Extreme: 30 and above         Admitted From: Home Disposition: Home  Recommendations for Outpatient Follow-up:  1. Follow up with PCP in 1-2 weeks 2. Follow-up with cardiology in 2 weeks 3. Please obtain BMP/CBC in one week 4. Please follow up with your PCP on the following pending results: Unresulted Labs (From admission, onward)          Start     Ordered   Signed and Held  Protime-INR  Once,   STAT        Signed and [place]: Yes Equipment/Devices: None  Discharge Condition: Stable CODE STATUS: Full code Diet recommendation: Low-sodium  Subjective: Seen and examined.  No complaints.  Brief/Interim Summary: [name] is a 75 yo male with PMH chronic dCHF, CKD4, afib (on Eliquis), COPD (on chronic 3-4 L O2), OHS/OSA, HTN, HLD, pulmonary HTN, DMII who presented to the ER with abdominal distention/bloating and pain that had been going on for approximately 2 weeks. He also had associated nausea/vomiting. He endorsed no chest pain or worsening shortness of breath despite imaging findings and only endorsed a mild nonproductive cough.  In the ED: Chest x-ray reveals large left-sided consolidation concerning for pneumonia. CT of the abdomen pelvis reveals a large stool burden with a 7 cm stool ball in the rectum. BUN  138, creatinine 3.96, potassium 2.5 WBC count 16.4 Covid negative  The patient received vancomycin and cefepime in the ED and was admitted to the hospital with a diagnosis of severe sepsis secondary to left-sided hospital-acquired pneumonia since he was recently hospitalized.  Procalcitonin was also elevated.  Blood culture as well as MRSA PCR remain negative.  Did not have any signs of COPD exacerbation.  He completed 5 days of IV cefepime and Flagyl on 12/17/2020. Patient was hospitalized this past January and released on 11/27/2020 after being treated for acute on chronic heart failure.  Also came in with acute kidney injury superimposed on CKD stage IIIb.  He was given some IV fluids which were discontinued soon after.  His renal function improved but then worsened and now is trending down again.  Cardiology was consulted due to history of chronic diastolic congestive heart failure with prominent RV failure as well as atrial flutter.  Patient's torsemide was initially held and then was reduced from 80 mg p.o. twice daily to 80 mg p.o. daily.  Patient underwent successful TEE/DCCV on 12/17/2020 and is maintaining normal sinus rhythm.  He was placed on amiodarone 200 mg twice daily and Eliquis was continued.  Electrolytes were replenished.  He was seen by PT OT however their initial recommendation was discharged to SNF but patient was very adamant on not going to SNF so they recommended home with home health.  Patient has been  cleared by cardiology for discharge.  He will be discharged in stable condition today with home health.  Patient is agreeable with the discharge plan as well.  Patient currently on 3 L of oxygen which he uses chronically due to COPD.  Discharge Diagnoses:  Principal Problem:   Severe sepsis ([place]) Active Problems:   Essential hypertension   PAF (paroxysmal atrial fibrillation) (HCC)   COPD (chronic obstructive pulmonary disease) (HCC)   Acute metabolic encephalopathy   Chronic  respiratory failure with hypoxia (HCC)   AKI (acute kidney injury) ([facility])   Hypokalemia   Diabetes mellitus type 2 in obese (HCC)   Acute on chronic systolic CHF (congestive heart failure) (HCC)   Acute renal failure superimposed on stage 4 chronic kidney disease (HCC)   Abdominal pain    Discharge Instructions   Allergies as of 12/19/2020      Reactions   Isosorb Dinitrate-hydralazine Shortness Of Breath   Benazepril Nausea And Vomiting   Brimonidine Other (See Comments)   Dry eyes   Metformin Other (See Comments)   Cramps and abdominal pain   Morphine Rash      Medication List    STOP taking these medications   gabapentin 300 MG capsule Commonly known as: NEURONTIN   simethicone 80 MG chewable tablet Commonly known as: MYLICON     TAKE these medications   acetaminophen 325 MG tablet Commonly known as: TYLENOL Take 2 tablets (650 mg total) by mouth every 4 (four) hours as needed for headache or mild pain.   albuterol 108 (90 Base) MCG/ACT inhaler Commonly known as: VENTOLIN HFA Inhale 2 puffs into the lungs every 6 (six) hours as needed for wheezing or shortness of breath.   albuterol (2.5 MG/3ML) 0.083% nebulizer solution Commonly known as: PROVENTIL Take 3 mLs (2.5 mg total) by nebulization every 4 (four) hours as needed for wheezing or shortness of breath.   amiodarone 200 MG tablet Commonly known as: PACERONE Take 1 tablet twice daily for next 2 weeks followed by 1 tablet daily. What changed:   how much to take  how to take this  when to take this  additional instructions   apixaban 5 MG Tabs tablet Commonly known as: ELIQUIS Take 1 tablet (5 mg total) by mouth 2 (two) times daily. What changed:   medication strength  how much to take   atorvastatin 40 MG tablet Commonly known as: LIPITOR Take 1 tablet (40 mg total) by mouth daily. What changed: when to take this   Breztri Aerosphere 160-9-4.8 MCG/ACT Aero Generic drug:  Budeson-Glycopyrrol-Formoterol Inhale 2 puffs into the lungs 2 (two) times daily.   carbamide peroxide 6.5 % OTIC solution Commonly known as: DEBROX Place 5-10 drops into both ears 3 (three) times daily as needed (ear wax).   Carboxymethylcellulose Sod PF 0.25 % Soln Place 1 drop into both eyes 4 (four) times daily.   cetirizine 10 MG tablet Commonly known as: ZYRTEC Take 10 mg by mouth at bedtime.   diazepam 10 MG tablet Commonly known as: VALIUM Take 10 mg by mouth 3 (three) times daily as needed for seizure.   famotidine 20 MG tablet Commonly known as: PEPCID TAKE 1 TABLET(20 MG) BY MOUTH TWICE DAILY What changed: See the new instructions.   feeding supplement (GLUCERNA SHAKE) Liqd Take 237 mLs by mouth 3 (three) times daily between meals.   ferrous sulfate 325 (65 FE) MG tablet Take 325 mg by mouth every Monday, Wednesday, and Friday at 6 PM.  fluticasone 50 MCG/ACT nasal spray Commonly known as: FLONASE Place 2 sprays into both nostrils daily.   hydrocortisone 1 % lotion Apply 1 application topically 2 (two) times daily. Back   hydrocortisone 2.5 % rectal cream Commonly known as: ANUSOL-HC Place 1 application rectally 2 (two) times daily as needed for hemorrhoids or itching.   insulin aspart 100 UNIT/ML injection Commonly known as: novoLOG Inject 0-15 Units into the skin 3 (three) times daily with meals. What changed: how much to take   insulin aspart protamine- aspart (70-30) 100 UNIT/ML injection Commonly known as: NOVOLOG MIX 70/30 Inject 10 Units into the skin 2 (two) times daily with a meal.   Insulin Syringe 27G X 1/2" 0.5 ML Misc 100 Syringes by Does not apply route 3 (three) times daily.   ipratropium-albuterol 0.5-2.5 (3) MG/3ML Soln Commonly known as: DUONEB Take 3 mLs by nebulization 4 (four) times daily as needed.   latanoprost 0.005 % ophthalmic solution Commonly known as: XALATAN Place 1 drop into both eyes at bedtime.   lidocaine 5  % Commonly known as: LIDODERM Place 1 patch onto the skin daily. Remove & Discard patch within 12 hours or as directed by MD What changed:   when to take this  reasons to take this   Magnesium Oxide 420 MG Tabs Take 420 mg by mouth daily.   oxybutynin 5 MG tablet Commonly known as: DITROPAN Take 1 tablet by mouth 4 (four) times daily as needed for incontinence.   oxyCODONE-acetaminophen 10-325 MG tablet Commonly known as: PERCOCET Take 1 tablet by mouth every 6 (six) hours as needed for pain.   OXYGEN Inhale 3 L/min into the lungs continuous.   pantoprazole 40 MG tablet Commonly known as: PROTONIX Take 40 mg by mouth 2 (two) times daily.   polyethylene glycol powder 17 GM/SCOOP powder Commonly known as: GLYCOLAX/MIRALAX Take 17 g by mouth daily as needed for constipation.   potassium chloride SA 20 MEQ tablet Commonly known as: KLOR-CON Take 1 tablet (20 mEq total) by mouth 2 (two) times daily. What changed: how much to take   senna-docusate 8.6-50 MG tablet Commonly known as: Senokot-S Take 1 tablet by mouth at bedtime as needed.   silver sulfADIAZINE 1 % cream Commonly known as: SILVADENE Apply topically daily. What changed: how much to take   sodium chloride 0.65 % Soln nasal spray Commonly known as: OCEAN Place 2 sprays into both nostrils as needed for congestion.   tamsulosin 0.4 MG Caps capsule Commonly known as: FLOMAX Take 1 capsule (0.4 mg total) by mouth daily. What changed: when to take this   terbinafine 1 % cream Commonly known as: LAMISIL Apply 1 application topically daily.   Torsemide 40 MG Tabs Take 80 mg by mouth daily. What changed:   medication strength  when to take this   umeclidinium bromide 62.5 MCG/INH Aepb Commonly known as: INCRUSE ELLIPTA Inhale 1 puff into the lungs daily.   Vitamin D 50 MCG (2000 UT) Caps Take 2,000 Units by mouth daily.   zinc oxide 20 % ointment Apply 1 application topically 2 (two) times  daily.       Follow-up Information    [name], MD Follow up.   Specialty: Internal Medicine Why: Office will be calling you with an appointment within 24-48 hours Contact information: [address] [place] [address] [phone]              Allergies  Allergen Reactions  . Isosorb Dinitrate-Hydralazine Shortness Of Breath  .  Benazepril Nausea And Vomiting  . Brimonidine Other (See Comments)    Dry eyes  . Metformin Other (See Comments)    Cramps and abdominal pain  . Morphine Rash    Consultations: Cardiology   Procedures/Studies: CT Abdomen Pelvis Wo Contrast  Result Date: 12/11/2020 CLINICAL DATA:  Abdominal distension. Abscess or infection suspected. Nausea and vomiting EXAM: CT ABDOMEN AND PELVIS WITHOUT CONTRAST TECHNIQUE: Multidetector CT imaging of the abdomen and pelvis was performed following the standard protocol without IV contrast. COMPARISON:  CT 10/05/2020 CT 01/03/2019 FINDINGS: Lower chest: Dense consolidation LEFT lung base. Interlobular septal thickening at the RIGHT lung base. 7 mm nodule in the RIGHT lower lobe on image 7/4. Interval improvement in RIGHT pleural effusion. LEFT basilar consolidation is worse. Hepatobiliary: No focal hepatic lesion on noncontrast exam. Gallbladder is distended to 5.7 cm. No radiodense gallstones. No gallbladder inflammation. Intrahepatic duct dilatation on noncontrast exam Pancreas: Pancreas is normal. No ductal dilatation. No pancreatic inflammation. Spleen: Multiple splenules in the LEFT upper quadrant. Adrenals/urinary tract: Of LEFT adrenal gland thickening to 19 mm has attenuation greater than benign adenoma. No nephrolithiasis or ureterolithiasis Stomach/Bowel: Stomach, small bowel, appendix, and cecum are normal. Ascending transverse colon normal. There is moderate volume stool in the descending colon. Large volume stool in the rectosigmoid colon. Stool ball in the rectum measuring 7 cm. Vascular/Lymphatic:  Abdominal aorta is normal caliber with atherosclerotic calcification. There is no retroperitoneal or periportal lymphadenopathy. No pelvic lymphadenopathy. Reproductive: Prostate unremarkable Other: No intraperitoneal free air Musculoskeletal: LEFT hip prosthetic.  No acute osseous findings IMPRESSION: 1. Large volume stool in the rectosigmoid suggest constipation. 2. Dense consolidation in the LEFT lower lobe new from comparison exam. 3. Improvement in RIGHT pleural effusion. 4. RIGHT lobe pulmonary nodule. Recommend follow-up CT chest in 3 months. 5. Distended gallbladder without evidence acute cholecystitis. 6. Nodular thickening of the LEFT adrenal gland similar to CT 01/03/2019 Electronically Signed   By: Suzy Bouchard M.D.   On: 12/11/2020 14:28   CT Head Wo Contrast  Result Date: 12/11/2020 CLINICAL DATA:  Altered mental status. EXAM: CT HEAD WITHOUT CONTRAST TECHNIQUE: Contiguous axial images were obtained from the base of the skull through the vertex without intravenous contrast. COMPARISON:  Head CT scan 04/09/2019. FINDINGS: Brain: No evidence of acute infarction, hemorrhage, hydrocephalus, extra-axial collection or mass lesion/mass effect. Chronic microvascular ischemic change and cavum septum pellucidum et vergae noted. Vascular: No hyperdense vessel.  Atherosclerosis is seen. Skull: Intact.  No focal lesion. Sinuses/Orbits: Negative. Other: None. IMPRESSION: No acute abnormality. Chronic microvascular ischemic change. Atherosclerosis. Electronically Signed   By: Inge Rise M.D.   On: 12/11/2020 14:00   US RENAL  Result Date: 12/11/2020 CLINICAL DATA:  Acute renal failure EXAM: RENAL / URINARY TRACT ULTRASOUND COMPLETE COMPARISON:  None. FINDINGS: Right Kidney: Renal measurements: 10.6 x 6.1 x 5.6 cm = volume: 188 mL. Echogenicity within normal limits. No mass or hydronephrosis visualized. Left Kidney: Renal measurements: 10.9 x 5.3 x 4.8 cm = volume: 143 mL. Echogenicity within normal  limits. No mass or hydronephrosis visualized. Bladder: Bladder decompressed and poorly assessed by sonography. Other: None. IMPRESSION: 1. Unremarkable sonographic appearance of the kidneys. 2. Decompressed urinary bladder poorly assessed by sonography. Electronically Signed   By: Lovena Le M.D.   On: 12/11/2020 18:44   DG Chest Port 1 View  Result Date: 12/11/2020 CLINICAL DATA:  Weakness EXAM: PORTABLE CHEST 1 VIEW COMPARISON:  11/21/2020 FINDINGS: Cardiac shadow is enlarged but stable. Aortic calcifications are again seen.  Persistent left basilar airspace disease with associated effusion is noted. Some interval clearing in the right base is noted. Persistent central vascular congestion is seen. IMPRESSION: Persistent vascular congestion. Stable left basilar airspace opacity with a fusion although clearing in the right base is noted. Electronically Signed   By: Inez Catalina M.D.   On: 12/11/2020 12:02   DG Chest Port 1 View  Result Date: 11/21/2020 CLINICAL DATA:  Shortness of breath EXAM: PORTABLE CHEST 1 VIEW COMPARISON:  10/29/2020, 08/08/2020, 08/14/2019 FINDINGS: Cardiomegaly with vascular congestion. Increasing bilateral effusions and lower lung airspace disease. No pneumothorax IMPRESSION: Cardiomegaly with vascular congestion. Worsening bilateral effusions and left greater than right basilar airspace disease which may reflect pneumonia. Electronically Signed   By: Donavan Foil M.D.   On: 11/21/2020 20:17   ECHO TEE  Result Date: 12/17/2020    TRANSESOPHOGEAL ECHO REPORT   Patient Name:   ONIX JUMPER Date of Exam: 12/17/2020 Medical Rec #:  [id number]    Height:       72.0 in Accession #:    [id number]   Weight:       212.1 lb Date of Birth:  [birth year]    BSA:          2.184 m Patient Age:    39 years     BP:           119/54 mmHg Patient Gender: M            HR:           91 bpm. Exam Location:  Inpatient Procedure: Transesophageal Echo, Cardiac Doppler and Color Doppler Indications:      Atrial flibrillation/flutter  History:         Patient has prior history of Echocardiogram examinations, most                  recent 09/26/2020. CHF, CAD, COPD, Arrythmias:Atrial                  Fibrillation; Risk Factors:Hypertension, Diabetes, Dyslipidemia                  and Former Smoker.  Sonographer:     Clayton Lefort RDCS (AE) Referring Phys:  [id number] AMY D CLEGG Diagnosing Phys: Loralie Champagne MD PROCEDURE: After discussion of the risks and benefits of a TEE, an informed consent was obtained from the patient. The transesophogeal probe was passed without difficulty through the esophogus of the patient. Local oropharyngeal anesthetic was provided with Cetacaine. Sedation performed by different physician. The patient was monitored while under deep sedation. Anesthestetic sedation was provided intravenously by Anesthesiology: 149.81m of Propofol, 1073mof Lidocaine. Image quality was adequate. The  patient developed no complications during the procedure. A successful direct current cardioversion was performed at 200 joules with 1 attempt. IMPRESSIONS  1. Left ventricular ejection fraction, by estimation, is 45 to 50%. The left ventricle has mildly decreased function. The left ventricle demonstrates global hypokinesis. There is mild left ventricular hypertrophy.  2. Right ventricular systolic function is severely reduced. The right ventricular size is severely enlarged. D-shaped interventricular septum suggestive of RV pressure/volume overload.  3. No left atrial/left atrial appendage thrombus was detected.  4. Right atrial size was moderately dilated.  5. The mitral valve is normal in structure. Trivial mitral valve regurgitation. No evidence of mitral stenosis.  6. The aortic valve is tricuspid. Aortic valve regurgitation is mild. No aortic stenosis is present.  7. No PFO or ASD by color doppler.  8. Peak RV-RA gradient 53 mmHg.  9. A small pericardial effusion is present. FINDINGS  Left Ventricle: Left  ventricular ejection fraction, by estimation, is 45 to 50%. The left ventricle has mildly decreased function. The left ventricle demonstrates global hypokinesis. The left ventricular internal cavity size was normal in size. There is  mild left ventricular hypertrophy. Right Ventricle: The right ventricular size is severely enlarged. No increase in right ventricular wall thickness. Right ventricular systolic function is severely reduced. Left Atrium: Left atrial size was normal in size. No left atrial/left atrial appendage thrombus was detected. Right Atrium: Right atrial size was moderately dilated. Pericardium: A small pericardial effusion is present. Mitral Valve: The mitral valve is normal in structure. Trivial mitral valve regurgitation. No evidence of mitral valve stenosis. Tricuspid Valve: The tricuspid valve is normal in structure. Tricuspid valve regurgitation is trivial. Aortic Valve: The aortic valve is tricuspid. Aortic valve regurgitation is mild. No aortic stenosis is present. Pulmonic Valve: The pulmonic valve was normal in structure. Pulmonic valve regurgitation is trivial. No evidence of pulmonic stenosis. Aorta: The aortic root is normal in size and structure. IAS/Shunts: No PFO or ASD by color doppler.  TRICUSPID VALVE TR Peak grad:   52.7 mmHg TR Vmax:        363.00 cm/s Loralie Champagne MD Electronically signed by Loralie Champagne MD Signature Date/Time: 12/17/2020/3:46:44 PM    Final      Discharge Exam: Vitals:   12/19/20 0449 12/19/20 0951  BP: 107/74 (!) 103/47  Pulse: 75 92  Resp: 17   Temp: 98.6 F (37 C)   SpO2: 94% 95%   Vitals:   12/18/20 0845 12/18/20 1506 12/19/20 0449 12/19/20 0951  BP:  (!) 113/56 107/74 (!) 103/47  Pulse:  73 75 92  Resp:   17   Temp:  98.2 F (36.8 C) 98.6 F (37 C)   TempSrc:      SpO2: 96% 93% 94% 95%  Weight:      Height:        General: Pt is alert, awake, not in acute distress Cardiovascular: RRR, S1/S2 +, no rubs, no  gallops Respiratory: CTA bilaterally, no wheezing, no rhonchi Abdominal: Soft, NT, ND, bowel sounds + Extremities: no edema, no cyanosis    The results of significant diagnostics from this hospitalization (including imaging, microbiology, ancillary and laboratory) are listed below for reference.     Microbiology: Recent Results (from the past 240 hour(s))  Culture, blood (single)     Status: None   Collection Time: 12/11/20  2:58 PM   Specimen: BLOOD  Result Value Ref Range Status   Specimen Description BLOOD SITE NOT SPECIFIED  Final   Special Requests   Final    BOTTLES DRAWN AEROBIC AND ANAEROBIC Blood Culture adequate volume   Culture   Final    NO GROWTH 5 DAYS Performed at [facility], [address] [place], [place] [address]    Report Status 12/16/2020 FINAL  Final  MRSA PCR Screening     Status: None   Collection Time: 12/13/20  7:48 AM   Specimen: Nasal Mucosa; Nasopharyngeal  Result Value Ref Range Status   MRSA by PCR NEGATIVE NEGATIVE Final    Comment:        The GeneXpert MRSA Assay (FDA approved for NASAL specimens only), is one component of a comprehensive MRSA colonization surveillance program. It is not intended to diagnose MRSA infection nor to guide or monitor treatment for MRSA infections. Performed at  [facility], [place] [address], [place], [place] [address]      Labs: BNP (last 3 results) Recent Labs    10/29/20 2252 11/21/20 1944 12/11/20 0730  BNP 2,913.5* 2,475.4* 6,644.0*   Basic Metabolic Panel: Recent Labs  Lab 12/13/20 0505 12/13/20 3474 12/14/20 0021 12/15/20 0115 12/16/20 0118 12/17/20 0222 12/18/20 0128 12/19/20 0158  NA  --    < > 142 144 142 142 137 138  K  --    < > 3.5 3.3* 3.4* 3.2* 3.5 3.5  CL  --    < > 106 106 102 96* 95* 98  CO2  --    < > _0 32 27 27  GLUCOSE  --    < > 117* 181* 147* 80 97 127*  BUN  --    < > 86* 63* 49* 45* 41* 37*  CREATININE  --    < > 2.07* 1.90* 1.93* 2.13* 2.20*  2.14*  CALCIUM  --    < > 8.7* 8.6* 8.6* 8.6* 8.2* 8.5*  MG 2.7*  --  2.6* 2.2 1.8  --  2.3  --    < > = values in this interval not displayed.   Liver Function Tests: No results for input(s): AST, ALT, ALKPHOS, BILITOT, PROT, ALBUMIN in the last 168 hours. No results for input(s): LIPASE, AMYLASE in the last 168 hours. No results for input(s): AMMONIA in the last 168 hours. CBC: Recent Labs  Lab 12/14/20 0021 12/15/20 0115 12/16/20 0118 12/18/20 0128 12/19/20 0158  WBC 18.8* 11.8* 12.5* 13.6* 13.4*  NEUTROABS 14.3* 7.5 7.9* 10.1* 10.5*  HGB 9.9* 9.6* 9.9* 10.4* 10.4*  HCT 31.4* 31.0* 31.8* 32.9* 34.7*  MCV 84.4 83.8 82.4 83.1 84.2  PLT 399 384 425* 376 352   Cardiac Enzymes: No results for input(s): CKTOTAL, CKMB, CKMBINDEX, TROPONINI in the last 168 hours. BNP: Invalid input(s): POCBNP CBG: Recent Labs  Lab 12/18/20 0727 12/18/20 1126 12/18/20 1708 12/18/20 2129 12/19/20 0809  GLUCAP 101* 100* 110* 150* 122*   D-Dimer No results for input(s): DDIMER in the last 72 hours. Hgb A1c No results for input(s): HGBA1C in the last 72 hours. Lipid Profile No results for input(s): CHOL, HDL, LDLCALC, TRIG, CHOLHDL, LDLDIRECT in the last 72 hours. Thyroid function studies No results for input(s): TSH, T4TOTAL, T3FREE, THYROIDAB in the last 72 hours.  Invalid input(s): FREET3 Anemia work up No results for input(s): VITAMINB12, FOLATE, FERRITIN, TIBC, IRON, RETICCTPCT in the last 72 hours. Urinalysis    Component Value Date/Time   COLORURINE YELLOW 12/11/2020 1107   APPEARANCEUR HAZY (A) 12/11/2020 1107   LABSPEC 1.020 12/11/2020 1107   PHURINE 5.5 12/11/2020 1107   GLUCOSEU NEGATIVE 12/11/2020 1107   HGBUR NEGATIVE 12/11/2020 1107   BILIRUBINUR NEGATIVE 12/11/2020 1107   KETONESUR NEGATIVE 12/11/2020 1107   PROTEINUR NEGATIVE 12/11/2020 1107   UROBILINOGEN 0.2 10/26/2014 2206   NITRITE NEGATIVE 12/11/2020 1107   LEUKOCYTESUR NEGATIVE 12/11/2020 1107   Sepsis  Labs Invalid input(s): PROCALCITONIN,  WBC,  LACTICIDVEN Microbiology Recent Results (from the past 240 hour(s))  Culture, blood (single)     Status: None   Collection Time: 12/11/20  2:58 PM   Specimen: BLOOD  Result Value Ref Range Status   Specimen Description BLOOD SITE NOT SPECIFIED  Final   Special Requests   Final    BOTTLES DRAWN AEROBIC AND ANAEROBIC Blood Culture adequate volume   Culture   Final    NO GROWTH 5  DAYS Performed at [facility], [place] [address], [place], [place] [address]    Report Status 12/16/2020 FINAL  Final  MRSA PCR Screening     Status: None   Collection Time: 12/13/20  7:48 AM   Specimen: Nasal Mucosa; Nasopharyngeal  Result Value Ref Range Status   MRSA by PCR NEGATIVE NEGATIVE Final    Comment:        The GeneXpert MRSA Assay (FDA approved for NASAL specimens only), is one component of a comprehensive MRSA colonization surveillance program. It is not intended to diagnose MRSA infection nor to guide or monitor treatment for MRSA infections. Performed at [facility], [place] [address] [place], [place] [address]      Time coordinating discharge: Over 30 minutes  SIGNED:   Darliss Cheney, MD  [facility] 12/19/2020, 11:20 AM  If 7PM-7AM, please contact night-coverage [email or web address]

## 2020-12-19 NOTE — Discharge Instructions (Signed)
Community-Acquired Pneumonia, Adult Pneumonia is a lung infection that causes inflammation and the buildup of mucus and fluids in the lungs. This may cause coughing and difficulty breathing. Community-acquired pneumonia is pneumonia that develops in people who are not, and have not recently been, in a hospital or other health care facility. Usually, pneumonia develops as a result of an illness that is caused by a virus, such as the common cold and the flu (influenza). It can also be caused by bacteria or fungi. While the common cold and influenza can pass from person to person (are contagious), pneumonia itself is not considered contagious. What are the causes? This condition may be caused by:  Viruses.  Bacteria.  Fungi, such as molds or mushrooms.   What increases the risk? The following factors may make you more likely to develop this condition:  Having certain medical conditions, such as: ? A long-term (chronic) disease, which may include chronic obstructive pulmonary disease (COPD), asthma, heart failure, cystic fibrosis, diabetes, kidney disease, sickle cell disease, and human immunodeficiency virus (HIV). ? A condition that increases the risk of breathing in (aspirating) mucus and other fluids from your mouth and nose. ? A weakened body defense system (immune system).  Having had your spleen removed (splenectomy). The spleen is the organ that helps fight germs and infections.  Not cleaning your teeth and gums well (poor dental hygiene).  Using tobacco products.  Traveling to places where germs that cause pneumonia are present.  Being near certain animals, or animal habitats, that have germs that cause pneumonia.  Being older than 73 years of age. What are the signs or symptoms? Symptoms of this condition include:  A dry cough or a wet (productive) cough.  A fever.  Sweating or chills.  Chest pain, especially when breathing deeply or coughing.  Fast breathing,  difficulty breathing, or shortness of breath.  Tiredness (fatigue).  Muscle aches. How is this diagnosed? This condition may be diagnosed based on your medical history or a physical exam. You may also have tests, including:  Chest X-rays.  Tests of the level of oxygen and other gases in your blood.  Tests of: ? Your blood. ? Mucus from your lungs (sputum). ? Fluid around your lungs (pleural fluid). ? Your urine. If your pneumonia is severe, other tests may be done to learn more about the cause.   How is this treated? Treatment for this condition depends on many factors, such as the cause of your pneumonia, your medicines, and other medical conditions that you have. For most adults, pneumonia may be treated at home. In some cases, treatment must happen in a hospital and may include:  Medicines that are given by mouth (orally) or through an IV, including: ? Antibiotic medicines, if bacteria caused the pneumonia. ? Medicines that kill viruses (antiviral medicines), if a virus caused the pneumonia.  Oxygen therapy. Severe pneumonia, although rare, may require the following treatments:  Mechanical ventilation.This procedure uses a machine to help you breathe if you cannot breathe well on your own or maintain a safe level of blood oxygen.  Thoracentesis. This procedure removes any buildup of pleural fluid to help with breathing. Follow these instructions at home: Medicines  Take over-the-counter and prescription medicines only as told by your health care provider.  Take cough medicine only if you have trouble sleeping. Cough medicine can prevent your body from removing mucus from your lungs.  If you were prescribed an antibiotic medicine, take it as told by your health care   provider. Do not stop taking the antibiotic even if you start to feel better. Lifestyle  Do not drink alcohol.  Do not use any products that contain nicotine or tobacco, such as cigarettes, e-cigarettes, and  chewing tobacco. If you need help quitting, ask your health care provider.  Eat a healthy diet. This includes plenty of vegetables, fruits, whole grains, low-fat dairy products, and lean protein.      General instructions  Rest a lot and get at least 8 hours of sleep each night.  Sleep in a partly upright position at night. Place a few pillows under your head or sleep in a reclining chair.  Return to your normal activities as told by your health care provider. Ask your health care provider what activities are safe for you.  Drink enough fluid to keep your urine pale yellow. This helps to thin the mucus in your lungs.  If your throat is sore, gargle with a salt-water mixture 3-4 times a day or as needed. To make a salt-water mixture, completely dissolve -1 tsp (3-6 g) of salt in 1 cup (237 mL) of warm water.  Keep all follow-up visits as told by your health care provider. This is important.   How is this prevented? You can lower your risk of developing community-acquired pneumonia by:  Getting the pneumonia vaccine. There are different types and schedules of pneumonia vaccines. Ask your health care provider which option is best for you. Consider getting the pneumonia vaccine if: ? You are older than 73 years of age. ? You are 73-14 years of age and are receiving cancer treatment, have chronic lung disease, or have other medical conditions that affect your immune system. Ask your health care provider if this applies to you.  Getting your influenza vaccine every year. Ask your health care provider which type of vaccine is best for you.  Getting regular dental checkups.  Washing your hands often with soap and water for at least 20 seconds. If soap and water are not available, use hand sanitizer. Contact a health care provider if you have:  A fever.  Trouble sleeping because you cannot control your cough with cough medicine. Get help right away if:  Your shortness of breath becomes  worse.  Your chest pain increases.  Your sickness becomes worse, especially if you are an older adult or have a weak immune system.  You cough up blood. These symptoms may represent a serious problem that is an emergency. Do not wait to see if the symptoms will go away. Get medical help right away. Call your local emergency services (911 in the [place] Do not drive yourself to the hospital. Summary  Pneumonia is an infection of the lungs.  Community-acquired pneumonia develops in people who have not been in the hospital. It can be caused by bacteria, viruses, or fungi.  This condition may be treated with antibiotics or antiviral medicines.  Severe pneumonia may require a hospital stay and treatment to help with breathing. This information is not intended to replace advice given to you by your health care provider. Make sure you discuss any questions you have with your health care provider. Document Revised: 08/02/2019 Document Reviewed: 08/02/2019 Elsevier Patient Education  [place].

## 2020-12-20 IMAGING — DX DG CHEST 1V PORT
1 series · 1 of 1 positions shown · non-contrast
Comparison: Chest radiograph dated 07/07/2019

CLINICAL DATA: Dyspnea

EXAM:
PORTABLE CHEST 1 VIEW

[chest]
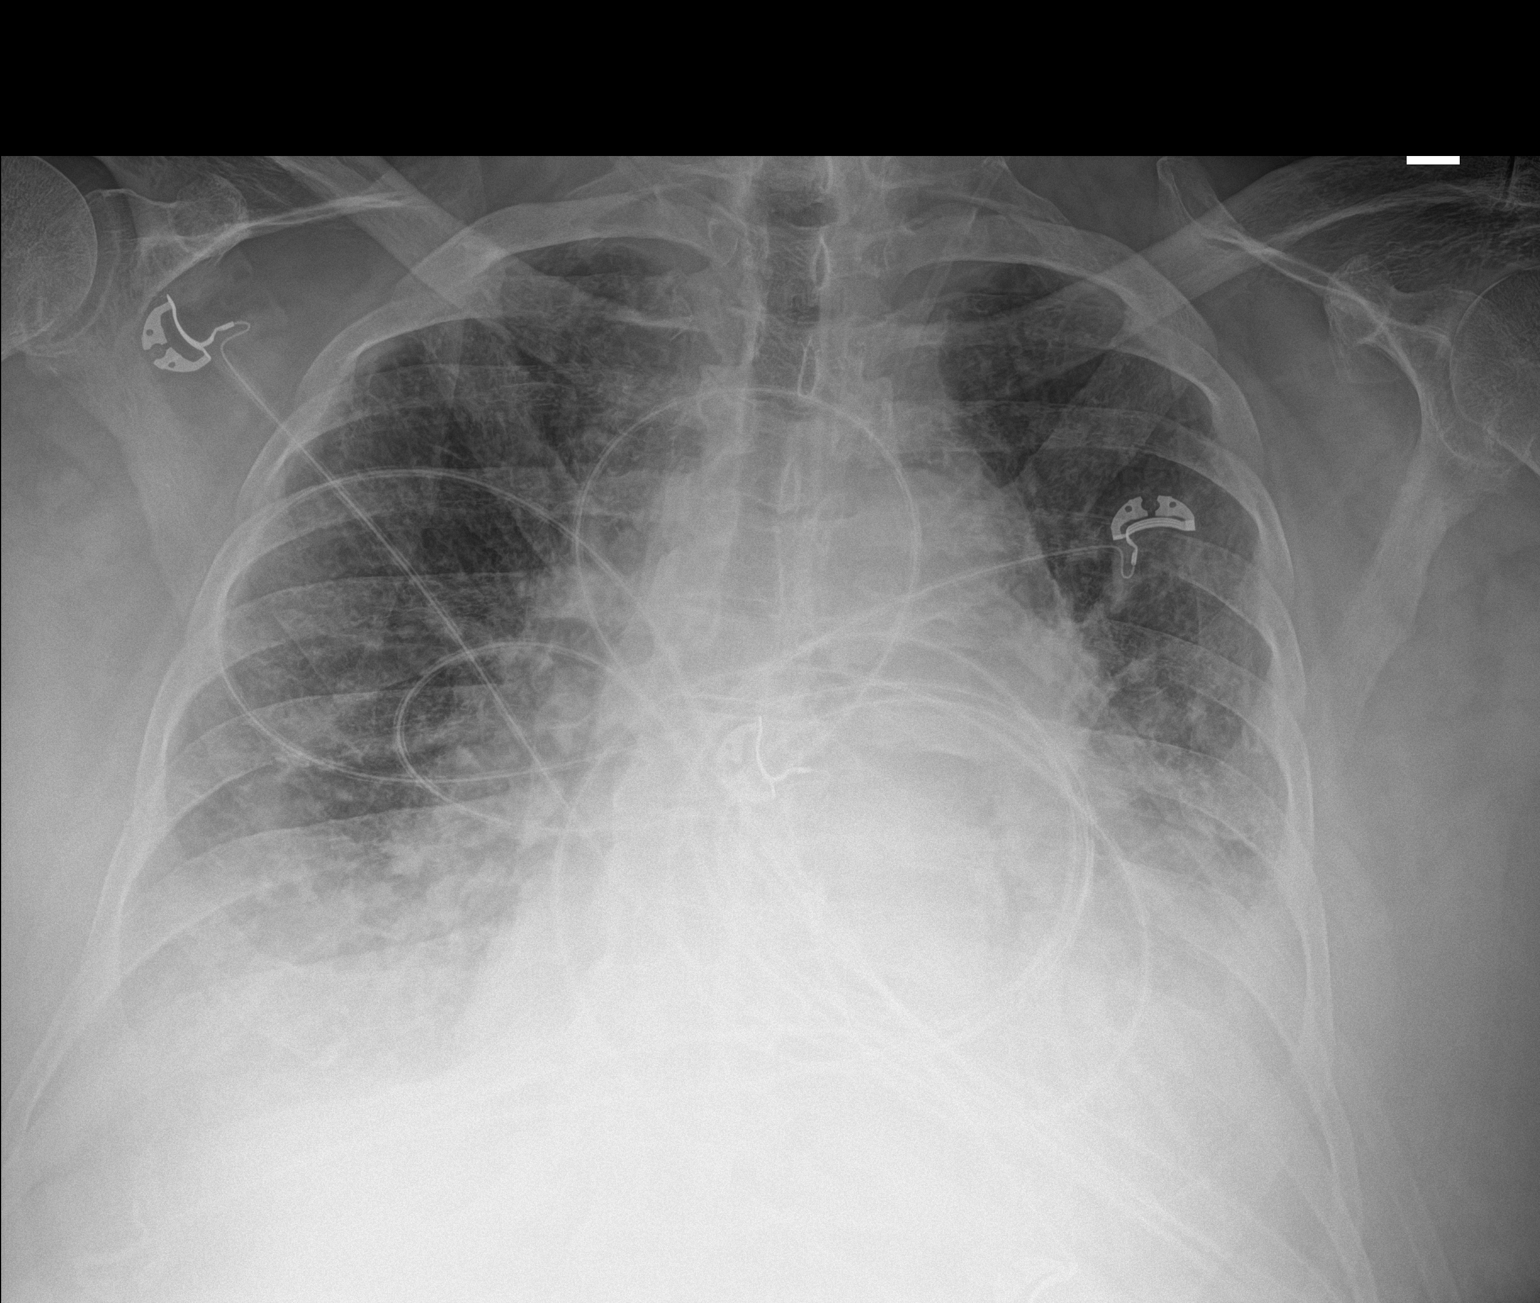

[1 of 1 positions shown; findings below may reference images not displayed]

FINDINGS: The heart size remains enlarged. There are small bilateral pleural
effusions with associated atelectasis/airspace disease, increased on
the right and unchanged on the left compared to 07/07/2019. There is
mild pulmonary vascular congestion. No pneumothorax is identified
the visualized skeletal structures are unremarkable.
IMPRESSION: Increased right and unchanged left pleural effusions with associated
atelectasis/airspace disease. Mild pulmonary vascular congestion.

## 2020-12-28 DIAGNOSIS — R269 Unspecified abnormalities of gait and mobility: Secondary | ICD-10-CM | POA: Diagnosis not present

## 2020-12-28 DIAGNOSIS — J441 Chronic obstructive pulmonary disease with (acute) exacerbation: Secondary | ICD-10-CM | POA: Diagnosis not present

## 2020-12-28 DIAGNOSIS — J449 Chronic obstructive pulmonary disease, unspecified: Secondary | ICD-10-CM | POA: Diagnosis not present

## 2020-12-28 DIAGNOSIS — J9621 Acute and chronic respiratory failure with hypoxia: Secondary | ICD-10-CM | POA: Diagnosis not present

## 2021-01-05 ENCOUNTER — Inpatient Hospital Stay (HOSPITAL_COMMUNITY)
Admission: EM | Admit: 2021-01-05 | Discharge: 2021-01-10 | DRG: 291 | Disposition: A | Discharge status: Home Health | Payer: No Typology Code available for payment source | Attending: Family Medicine | Admitting: Family Medicine

## 2021-01-05 ENCOUNTER — Encounter (HOSPITAL_COMMUNITY): Payer: Self-pay | Admitting: Emergency Medicine

## 2021-01-05 ENCOUNTER — Emergency Department (HOSPITAL_COMMUNITY): Payer: No Typology Code available for payment source

## 2021-01-05 DIAGNOSIS — M21371 Foot drop, right foot: Secondary | ICD-10-CM | POA: Diagnosis present

## 2021-01-05 DIAGNOSIS — I130 Hypertensive heart and chronic kidney disease with heart failure and stage 1 through stage 4 chronic kidney disease, or unspecified chronic kidney disease: Principal | ICD-10-CM | POA: Diagnosis present

## 2021-01-05 DIAGNOSIS — I272 Pulmonary hypertension, unspecified: Secondary | ICD-10-CM | POA: Diagnosis present

## 2021-01-05 DIAGNOSIS — Z8249 Family history of ischemic heart disease and other diseases of the circulatory system: Secondary | ICD-10-CM

## 2021-01-05 DIAGNOSIS — I509 Heart failure, unspecified: Secondary | ICD-10-CM

## 2021-01-05 DIAGNOSIS — L89152 Pressure ulcer of sacral region, stage 2: Secondary | ICD-10-CM | POA: Diagnosis present

## 2021-01-05 DIAGNOSIS — I5023 Acute on chronic systolic (congestive) heart failure: Secondary | ICD-10-CM | POA: Diagnosis present

## 2021-01-05 DIAGNOSIS — J9622 Acute and chronic respiratory failure with hypercapnia: Secondary | ICD-10-CM | POA: Diagnosis present

## 2021-01-05 DIAGNOSIS — E611 Iron deficiency: Secondary | ICD-10-CM | POA: Diagnosis present

## 2021-01-05 DIAGNOSIS — R0602 Shortness of breath: Secondary | ICD-10-CM | POA: Diagnosis not present

## 2021-01-05 DIAGNOSIS — E662 Morbid (severe) obesity with alveolar hypoventilation: Secondary | ICD-10-CM | POA: Diagnosis present

## 2021-01-05 DIAGNOSIS — Z20822 Contact with and (suspected) exposure to covid-19: Secondary | ICD-10-CM | POA: Diagnosis present

## 2021-01-05 DIAGNOSIS — G8929 Other chronic pain: Secondary | ICD-10-CM | POA: Diagnosis present

## 2021-01-05 DIAGNOSIS — J440 Chronic obstructive pulmonary disease with acute lower respiratory infection: Secondary | ICD-10-CM | POA: Diagnosis present

## 2021-01-05 DIAGNOSIS — I480 Paroxysmal atrial fibrillation: Secondary | ICD-10-CM | POA: Diagnosis present

## 2021-01-05 DIAGNOSIS — I248 Other forms of acute ischemic heart disease: Secondary | ICD-10-CM | POA: Diagnosis present

## 2021-01-05 DIAGNOSIS — Z87891 Personal history of nicotine dependence: Secondary | ICD-10-CM

## 2021-01-05 DIAGNOSIS — L89322 Pressure ulcer of left buttock, stage 2: Secondary | ICD-10-CM | POA: Diagnosis present

## 2021-01-05 DIAGNOSIS — G4733 Obstructive sleep apnea (adult) (pediatric): Secondary | ICD-10-CM | POA: Diagnosis present

## 2021-01-05 DIAGNOSIS — I50813 Acute on chronic right heart failure: Secondary | ICD-10-CM | POA: Diagnosis present

## 2021-01-05 DIAGNOSIS — Z7951 Long term (current) use of inhaled steroids: Secondary | ICD-10-CM

## 2021-01-05 DIAGNOSIS — I50810 Right heart failure, unspecified: Secondary | ICD-10-CM | POA: Diagnosis present

## 2021-01-05 DIAGNOSIS — R9431 Abnormal electrocardiogram [ECG] [EKG]: Secondary | ICD-10-CM | POA: Diagnosis present

## 2021-01-05 DIAGNOSIS — N1832 Chronic kidney disease, stage 3b: Secondary | ICD-10-CM | POA: Diagnosis present

## 2021-01-05 DIAGNOSIS — E785 Hyperlipidemia, unspecified: Secondary | ICD-10-CM | POA: Diagnosis present

## 2021-01-05 DIAGNOSIS — E1142 Type 2 diabetes mellitus with diabetic polyneuropathy: Secondary | ICD-10-CM | POA: Diagnosis present

## 2021-01-05 DIAGNOSIS — Z79899 Other long term (current) drug therapy: Secondary | ICD-10-CM

## 2021-01-05 DIAGNOSIS — IMO0002 Reserved for concepts with insufficient information to code with codable children: Secondary | ICD-10-CM | POA: Diagnosis present

## 2021-01-05 DIAGNOSIS — E1165 Type 2 diabetes mellitus with hyperglycemia: Secondary | ICD-10-CM | POA: Diagnosis present

## 2021-01-05 DIAGNOSIS — I428 Other cardiomyopathies: Secondary | ICD-10-CM | POA: Diagnosis present

## 2021-01-05 DIAGNOSIS — I2510 Atherosclerotic heart disease of native coronary artery without angina pectoris: Secondary | ICD-10-CM | POA: Diagnosis present

## 2021-01-05 DIAGNOSIS — Z7401 Bed confinement status: Secondary | ICD-10-CM

## 2021-01-05 DIAGNOSIS — Z9119 Patient's noncompliance with other medical treatment and regimen: Secondary | ICD-10-CM

## 2021-01-05 DIAGNOSIS — Z885 Allergy status to narcotic agent status: Secondary | ICD-10-CM

## 2021-01-05 DIAGNOSIS — Z8673 Personal history of transient ischemic attack (TIA), and cerebral infarction without residual deficits: Secondary | ICD-10-CM

## 2021-01-05 DIAGNOSIS — Z794 Long term (current) use of insulin: Secondary | ICD-10-CM

## 2021-01-05 DIAGNOSIS — L89312 Pressure ulcer of right buttock, stage 2: Secondary | ICD-10-CM | POA: Diagnosis present

## 2021-01-05 DIAGNOSIS — J449 Chronic obstructive pulmonary disease, unspecified: Secondary | ICD-10-CM | POA: Diagnosis present

## 2021-01-05 DIAGNOSIS — E873 Alkalosis: Secondary | ICD-10-CM | POA: Diagnosis present

## 2021-01-05 DIAGNOSIS — E1122 Type 2 diabetes mellitus with diabetic chronic kidney disease: Secondary | ICD-10-CM | POA: Diagnosis present

## 2021-01-05 DIAGNOSIS — Z888 Allergy status to other drugs, medicaments and biological substances status: Secondary | ICD-10-CM

## 2021-01-05 DIAGNOSIS — D6489 Other specified anemias: Secondary | ICD-10-CM | POA: Diagnosis present

## 2021-01-05 DIAGNOSIS — I4892 Unspecified atrial flutter: Secondary | ICD-10-CM | POA: Diagnosis present

## 2021-01-05 DIAGNOSIS — I5043 Acute on chronic combined systolic (congestive) and diastolic (congestive) heart failure: Secondary | ICD-10-CM | POA: Diagnosis present

## 2021-01-05 DIAGNOSIS — Z7902 Long term (current) use of antithrombotics/antiplatelets: Secondary | ICD-10-CM

## 2021-01-05 DIAGNOSIS — J9621 Acute and chronic respiratory failure with hypoxia: Secondary | ICD-10-CM | POA: Diagnosis present

## 2021-01-05 DIAGNOSIS — I4519 Other right bundle-branch block: Secondary | ICD-10-CM | POA: Diagnosis present

## 2021-01-05 DIAGNOSIS — Z9981 Dependence on supplemental oxygen: Secondary | ICD-10-CM

## 2021-01-05 DIAGNOSIS — E1140 Type 2 diabetes mellitus with diabetic neuropathy, unspecified: Secondary | ICD-10-CM | POA: Diagnosis present

## 2021-01-05 HISTORY — DX: Sleep apnea, unspecified: G47.30

## 2021-01-05 LAB — CBC
HCT: 32.5 % — ABNORMAL LOW (ref 39.0–52.0)
Hemoglobin: 9.5 g/dL — ABNORMAL LOW (ref 13.0–17.0)
MCH: 25.5 pg — ABNORMAL LOW (ref 26.0–34.0)
MCHC: 29.2 g/dL — ABNORMAL LOW (ref 30.0–36.0)
MCV: 87.1 fL (ref 80.0–100.0)
Platelets: 515 10*3/uL — ABNORMAL HIGH (ref 150–400)
RBC: 3.73 MIL/uL — ABNORMAL LOW (ref 4.22–5.81)
RDW: 19.8 % — ABNORMAL HIGH (ref 11.5–15.5)
WBC: 4.5 10*3/uL (ref 4.0–10.5)
nRBC: 0 % (ref 0.0–0.2)

## 2021-01-05 LAB — BASIC METABOLIC PANEL
Anion gap: 12 (ref 5–15)
BUN: 49 mg/dL — ABNORMAL HIGH (ref 8–23)
CO2: 28 mmol/L (ref 22–32)
Calcium: 8.1 mg/dL — ABNORMAL LOW (ref 8.9–10.3)
Chloride: 103 mmol/L (ref 98–111)
Creatinine, Ser: 2.16 mg/dL — ABNORMAL HIGH (ref 0.61–1.24)
GFR, Estimated: 32 mL/min — ABNORMAL LOW (ref >60)
Glucose, Bld: 78 mg/dL (ref 70–99)
Potassium: 3.5 mmol/L (ref 3.5–5.1)
Sodium: 143 mmol/L (ref 135–145)

## 2021-01-05 MED ORDER — FUROSEMIDE 10 MG/ML IJ SOLN
80.0000 mg | Freq: Once | INTRAMUSCULAR | Status: AC
  Administered 2021-01-06: 80 mg via INTRAVENOUS
  Filled 2021-01-05: qty 8

## 2021-01-05 NOTE — ED Triage Notes (Signed)
Pt transported by [facility] from home with shob, LE edema. On 6L by EMS, normally 3L. Pt denies pain, refused IV. Reports gaining 10 lbs in last week.

## 2021-01-06 ENCOUNTER — Encounter (HOSPITAL_COMMUNITY): Payer: Self-pay | Admitting: Family Medicine

## 2021-01-06 DIAGNOSIS — J9621 Acute and chronic respiratory failure with hypoxia: Secondary | ICD-10-CM

## 2021-01-06 DIAGNOSIS — I483 Typical atrial flutter: Secondary | ICD-10-CM | POA: Diagnosis not present

## 2021-01-06 DIAGNOSIS — E1140 Type 2 diabetes mellitus with diabetic neuropathy, unspecified: Secondary | ICD-10-CM | POA: Diagnosis present

## 2021-01-06 DIAGNOSIS — I248 Other forms of acute ischemic heart disease: Secondary | ICD-10-CM | POA: Diagnosis present

## 2021-01-06 DIAGNOSIS — J449 Chronic obstructive pulmonary disease, unspecified: Secondary | ICD-10-CM | POA: Diagnosis not present

## 2021-01-06 DIAGNOSIS — I4819 Other persistent atrial fibrillation: Secondary | ICD-10-CM | POA: Diagnosis not present

## 2021-01-06 DIAGNOSIS — D6489 Other specified anemias: Secondary | ICD-10-CM | POA: Diagnosis present

## 2021-01-06 DIAGNOSIS — I4892 Unspecified atrial flutter: Secondary | ICD-10-CM | POA: Diagnosis present

## 2021-01-06 DIAGNOSIS — I428 Other cardiomyopathies: Secondary | ICD-10-CM | POA: Diagnosis present

## 2021-01-06 DIAGNOSIS — Z20822 Contact with and (suspected) exposure to covid-19: Secondary | ICD-10-CM | POA: Diagnosis present

## 2021-01-06 DIAGNOSIS — E1142 Type 2 diabetes mellitus with diabetic polyneuropathy: Secondary | ICD-10-CM | POA: Diagnosis present

## 2021-01-06 DIAGNOSIS — I272 Pulmonary hypertension, unspecified: Secondary | ICD-10-CM | POA: Diagnosis present

## 2021-01-06 DIAGNOSIS — J9622 Acute and chronic respiratory failure with hypercapnia: Secondary | ICD-10-CM | POA: Diagnosis present

## 2021-01-06 DIAGNOSIS — E1165 Type 2 diabetes mellitus with hyperglycemia: Secondary | ICD-10-CM

## 2021-01-06 DIAGNOSIS — R29898 Other symptoms and signs involving the musculoskeletal system: Secondary | ICD-10-CM | POA: Diagnosis not present

## 2021-01-06 DIAGNOSIS — L89152 Pressure ulcer of sacral region, stage 2: Secondary | ICD-10-CM | POA: Diagnosis present

## 2021-01-06 DIAGNOSIS — E873 Alkalosis: Secondary | ICD-10-CM | POA: Diagnosis present

## 2021-01-06 DIAGNOSIS — R0602 Shortness of breath: Secondary | ICD-10-CM | POA: Diagnosis present

## 2021-01-06 DIAGNOSIS — Z9981 Dependence on supplemental oxygen: Secondary | ICD-10-CM | POA: Diagnosis not present

## 2021-01-06 DIAGNOSIS — E1122 Type 2 diabetes mellitus with diabetic chronic kidney disease: Secondary | ICD-10-CM | POA: Diagnosis present

## 2021-01-06 DIAGNOSIS — N1832 Chronic kidney disease, stage 3b: Secondary | ICD-10-CM

## 2021-01-06 DIAGNOSIS — I480 Paroxysmal atrial fibrillation: Secondary | ICD-10-CM | POA: Diagnosis present

## 2021-01-06 DIAGNOSIS — I50810 Right heart failure, unspecified: Secondary | ICD-10-CM

## 2021-01-06 DIAGNOSIS — E662 Morbid (severe) obesity with alveolar hypoventilation: Secondary | ICD-10-CM | POA: Diagnosis present

## 2021-01-06 DIAGNOSIS — L89322 Pressure ulcer of left buttock, stage 2: Secondary | ICD-10-CM | POA: Diagnosis present

## 2021-01-06 DIAGNOSIS — I50813 Acute on chronic right heart failure: Secondary | ICD-10-CM | POA: Diagnosis present

## 2021-01-06 DIAGNOSIS — J440 Chronic obstructive pulmonary disease with acute lower respiratory infection: Secondary | ICD-10-CM | POA: Diagnosis present

## 2021-01-06 DIAGNOSIS — R9431 Abnormal electrocardiogram [ECG] [EKG]: Secondary | ICD-10-CM

## 2021-01-06 DIAGNOSIS — I5043 Acute on chronic combined systolic (congestive) and diastolic (congestive) heart failure: Secondary | ICD-10-CM | POA: Diagnosis present

## 2021-01-06 DIAGNOSIS — G4733 Obstructive sleep apnea (adult) (pediatric): Secondary | ICD-10-CM

## 2021-01-06 DIAGNOSIS — I130 Hypertensive heart and chronic kidney disease with heart failure and stage 1 through stage 4 chronic kidney disease, or unspecified chronic kidney disease: Secondary | ICD-10-CM | POA: Diagnosis present

## 2021-01-06 DIAGNOSIS — E118 Type 2 diabetes mellitus with unspecified complications: Secondary | ICD-10-CM

## 2021-01-06 DIAGNOSIS — L89312 Pressure ulcer of right buttock, stage 2: Secondary | ICD-10-CM | POA: Diagnosis present

## 2021-01-06 LAB — CBC
HCT: 27.6 % — ABNORMAL LOW (ref 39.0–52.0)
Hemoglobin: 8.2 g/dL — ABNORMAL LOW (ref 13.0–17.0)
MCH: 25.9 pg — ABNORMAL LOW (ref 26.0–34.0)
MCHC: 29.7 g/dL — ABNORMAL LOW (ref 30.0–36.0)
MCV: 87.3 fL (ref 80.0–100.0)
Platelets: 478 10*3/uL — ABNORMAL HIGH (ref 150–400)
RBC: 3.16 MIL/uL — ABNORMAL LOW (ref 4.22–5.81)
RDW: 19.7 % — ABNORMAL HIGH (ref 11.5–15.5)
WBC: 5.2 10*3/uL (ref 4.0–10.5)
nRBC: 0 % (ref 0.0–0.2)

## 2021-01-06 LAB — TROPONIN I (HIGH SENSITIVITY)
Troponin I (High Sensitivity): 33 ng/L — ABNORMAL HIGH (ref <18)
Troponin I (High Sensitivity): 33 ng/L — ABNORMAL HIGH (ref <18)

## 2021-01-06 LAB — GLUCOSE, CAPILLARY
Glucose-Capillary: 82 mg/dL (ref 70–99)
Glucose-Capillary: 126 mg/dL — ABNORMAL HIGH (ref 70–99)
Glucose-Capillary: 115 mg/dL — ABNORMAL HIGH (ref 70–99)
Glucose-Capillary: 102 mg/dL — ABNORMAL HIGH (ref 70–99)

## 2021-01-06 LAB — RESP PANEL BY RT-PCR (FLU A&B, COVID) ARPGX2
Influenza A by PCR: NEGATIVE
Influenza B by PCR: NEGATIVE
SARS Coronavirus 2 by RT PCR: NEGATIVE

## 2021-01-06 LAB — BRAIN NATRIURETIC PEPTIDE: B Natriuretic Peptide: 3522.2 pg/mL — ABNORMAL HIGH (ref 0.0–100.0)

## 2021-01-06 LAB — PROCALCITONIN: Procalcitonin: 0.17 ng/mL

## 2021-01-06 LAB — MRSA PCR SCREENING: MRSA by PCR: NEGATIVE

## 2021-01-06 LAB — MAGNESIUM: Magnesium: 2 mg/dL (ref 1.7–2.4)

## 2021-01-06 MED ORDER — FAMOTIDINE 20 MG PO TABS
20.0000 mg | ORAL_TABLET | Freq: Two times a day (BID) | ORAL | Status: DC | PRN
  Administered 2021-01-06: 20 mg via ORAL
  Filled 2021-01-06: qty 1

## 2021-01-06 MED ORDER — FLUTICASONE PROPIONATE 50 MCG/ACT NA SUSP
1.0000 | Freq: Every day | NASAL | Status: DC
  Administered 2021-01-06 – 2021-01-09 (×5): 1 via NASAL
  Filled 2021-01-06: qty 16

## 2021-01-06 MED ORDER — INSULIN ASPART 100 UNIT/ML ~~LOC~~ SOLN
0.0000 [IU] | Freq: Three times a day (TID) | SUBCUTANEOUS | Status: DC
  Administered 2021-01-06 – 2021-01-10 (×4): 1 [IU] via SUBCUTANEOUS

## 2021-01-06 MED ORDER — BUDESON-GLYCOPYRROL-FORMOTEROL 160-9-4.8 MCG/ACT IN AERO: 2.0000 | INHALATION_SPRAY | Freq: Two times a day (BID) | RESPIRATORY_TRACT | Status: DC

## 2021-01-06 MED ORDER — TAMSULOSIN HCL 0.4 MG PO CAPS
0.4000 mg | ORAL_CAPSULE | Freq: Every day | ORAL | Status: DC
  Administered 2021-01-06 – 2021-01-10 (×5): 0.4 mg via ORAL
  Filled 2021-01-06 (×5): qty 1

## 2021-01-06 MED ORDER — UMECLIDINIUM BROMIDE 62.5 MCG/INH IN AEPB
1.0000 | INHALATION_SPRAY | Freq: Every day | RESPIRATORY_TRACT | Status: DC
  Administered 2021-01-07 – 2021-01-10 (×4): 1 via RESPIRATORY_TRACT
  Filled 2021-01-06: qty 7

## 2021-01-06 MED ORDER — POTASSIUM CHLORIDE 10 MEQ/100ML IV SOLN
10.0000 meq | Freq: Once | INTRAVENOUS | Status: AC
  Administered 2021-01-06: 10 meq via INTRAVENOUS
  Filled 2021-01-06: qty 100

## 2021-01-06 MED ORDER — POTASSIUM CHLORIDE CRYS ER 20 MEQ PO TBCR
20.0000 meq | EXTENDED_RELEASE_TABLET | Freq: Two times a day (BID) | ORAL | Status: DC
  Administered 2021-01-06 – 2021-01-09 (×8): 20 meq via ORAL
  Filled 2021-01-06 (×9): qty 1

## 2021-01-06 MED ORDER — LORAZEPAM 2 MG/ML IJ SOLN: 1.0000 mg | Freq: Four times a day (QID) | INTRAMUSCULAR | Status: DC | PRN

## 2021-01-06 MED ORDER — OXYCODONE-ACETAMINOPHEN 5-325 MG PO TABS
1.0000 | ORAL_TABLET | Freq: Four times a day (QID) | ORAL | Status: DC | PRN
  Administered 2021-01-06 – 2021-01-10 (×15): 1 via ORAL
  Filled 2021-01-06 (×15): qty 1

## 2021-01-06 MED ORDER — OXYBUTYNIN CHLORIDE 5 MG PO TABS
5.0000 mg | ORAL_TABLET | Freq: Four times a day (QID) | ORAL | Status: DC | PRN
  Filled 2021-01-06: qty 1

## 2021-01-06 MED ORDER — MOMETASONE FURO-FORMOTEROL FUM 100-5 MCG/ACT IN AERO
1.0000 | INHALATION_SPRAY | Freq: Two times a day (BID) | RESPIRATORY_TRACT | Status: DC
  Administered 2021-01-06 – 2021-01-10 (×8): 1 via RESPIRATORY_TRACT
  Filled 2021-01-06: qty 8.8

## 2021-01-06 MED ORDER — ATORVASTATIN CALCIUM 40 MG PO TABS
40.0000 mg | ORAL_TABLET | Freq: Every day | ORAL | Status: DC
  Administered 2021-01-06 – 2021-01-09 (×4): 40 mg via ORAL
  Filled 2021-01-06 (×4): qty 1

## 2021-01-06 MED ORDER — MAGNESIUM SULFATE IN D5W 1-5 GM/100ML-% IV SOLN
1.0000 g | Freq: Once | INTRAVENOUS | Status: AC
  Administered 2021-01-06: 1 g via INTRAVENOUS
  Filled 2021-01-06: qty 100

## 2021-01-06 MED ORDER — OXYCODONE HCL 5 MG PO TABS
5.0000 mg | ORAL_TABLET | Freq: Four times a day (QID) | ORAL | Status: DC | PRN
  Administered 2021-01-06 – 2021-01-10 (×15): 5 mg via ORAL
  Filled 2021-01-06 (×16): qty 1

## 2021-01-06 MED ORDER — GLUCERNA SHAKE PO LIQD
237.0000 mL | Freq: Three times a day (TID) | ORAL | Status: DC
  Administered 2021-01-06 – 2021-01-09 (×7): 237 mL via ORAL

## 2021-01-06 MED ORDER — SODIUM CHLORIDE 0.9 % IV SOLN: 250.0000 mL | INTRAVENOUS | Status: DC | PRN

## 2021-01-06 MED ORDER — ACETAMINOPHEN 325 MG PO TABS: 650.0000 mg | ORAL_TABLET | ORAL | Status: DC | PRN

## 2021-01-06 MED ORDER — OXYCODONE-ACETAMINOPHEN 10-325 MG PO TABS: 1.0000 | ORAL_TABLET | Freq: Four times a day (QID) | ORAL | Status: DC | PRN

## 2021-01-06 MED ORDER — FUROSEMIDE 10 MG/ML IJ SOLN
60.0000 mg | Freq: Two times a day (BID) | INTRAMUSCULAR | Status: DC
  Administered 2021-01-06 – 2021-01-07 (×3): 60 mg via INTRAVENOUS
  Filled 2021-01-06 (×3): qty 6

## 2021-01-06 MED ORDER — APIXABAN 5 MG PO TABS
5.0000 mg | ORAL_TABLET | Freq: Two times a day (BID) | ORAL | Status: DC
  Administered 2021-01-06 – 2021-01-10 (×9): 5 mg via ORAL
  Filled 2021-01-06 (×9): qty 1

## 2021-01-06 NOTE — Discharge Instructions (Signed)
Information on my medicine - ELIQUIS (apixaban)  Why was Eliquis prescribed for you? Eliquis was prescribed for you to reduce the risk of a blood clot forming that can cause a stroke if you have a medical condition called atrial fibrillation (a type of irregular heartbeat).  What do You need to know about Eliquis ? Take your Eliquis TWICE DAILY - one tablet in the morning and one tablet in the evening with or without food. If you have difficulty swallowing the tablet whole please discuss with your pharmacist how to take the medication safely.  Take Eliquis exactly as prescribed by your doctor and DO NOT stop taking Eliquis without talking to the doctor who prescribed the medication.  Stopping may increase your risk of developing a stroke.  Refill your prescription before you run out.  After discharge, you should have regular check-up appointments with your healthcare provider that is prescribing your Eliquis.  In the future your dose may need to be changed if your kidney function or weight changes by a significant amount or as you get older.  What do you do if you miss a dose? If you miss a dose, take it as soon as you remember on the same day and resume taking twice daily.  Do not take more than one dose of ELIQUIS at the same time to make up a missed dose.  Important Safety Information A possible side effect of Eliquis is bleeding. You should call your healthcare provider right away if you experience any of the following: ? Bleeding from an injury or your nose that does not stop. ? Unusual colored urine (red or dark brown) or unusual colored stools (red or black). ? Unusual bruising for unknown reasons. ? A serious fall or if you hit your head (even if there is no bleeding).  Some medicines may interact with Eliquis and might increase your risk of bleeding or clotting while on Eliquis. To help avoid this, consult your healthcare provider or pharmacist prior to using any new  prescription or non-prescription medications, including herbals, vitamins, non-steroidal anti-inflammatory drugs (NSAIDs) and supplements.  This website has more information on Eliquis (apixaban): http://[email or web address].com/eliquis/home

## 2021-01-06 NOTE — H&P (Signed)
History and Physical    Nicholas Caldwell OIZ:[id number] DOB: 04-May-1946 DOA: 01/05/2021  PCP: [name], MD   Patient coming from: Home   Chief Complaint: SOB, leg swelling, weight gain   HPI: Nicholas Caldwell is a 75 y.o. male with medical history significant for COPD, OSA, insulin-dependent diabetes mellitus, chronic hypoxic respiratory failure, paroxysmal atrial fibrillation/flutter status post CV last month, chronic kidney disease 3B, who presented to the emergency department for evaluation of shortness of breath, leg swelling, and weight gain.  Patient was admitted to the hospital 1 month ago with pneumonia, acute on chronic renal failure, and atrial fibrillation with RVR.  Patient was cardioverted during the admission last month, renal function returned to baseline with some IV fluids and then reduction in his diuretic dosing, and he refused recommended SNF on discharge and went home.  He did fairly well at home initially but has become short of breath over the past week with increasing leg edema and a 10 pound weight gain reported.  He denies any fevers, chills, or chest pain.  He requires 3 L/min of supplemental oxygen at baseline and he was requiring 6 L/min with EMS tonight.  ED Course: Upon arrival to the ED, patient is found to be afebrile, saturating mid 90s on 6 L/min of supplemental oxygen, tachypneic, and with systolic blood pressure in the 90s to 100.  EKG features sinus or ectopic atrial rhythm with incomplete RBBB and prolonged QT interval.  Chest x-ray is concerning for mild CHF.  Chemistry panel notable for creatinine of 2.16 and CBC features a hemoglobin of 9.5 and platelets 515,000.  High-sensitivity troponin is 33 and BNP 3522.  Patient was given 80 mg IV Lasix in the ED and has begun to diurese.  COVID-19 screening test is pending.  Review of Systems:  All other systems reviewed and apart from HPI, are negative.  Past Medical History:  Diagnosis Date  . Atrial flutter ([place])     a. recurrent AFlutter with RVR;  b. Amiodarone Rx started 4/16  . CAD (coronary artery disease)    a. LHC 1/16:  mLAD diffuse disease, pLCx mild disease, dLCx with disease but too small for PCI, RCA ok, EF 25-30%  . Chronic pain   . Chronic systolic CHF (congestive heart failure) ([place])   . COPD (chronic obstructive pulmonary disease) ([place])   . Diabetes mellitus without complication ([place])   . Ex-smoker 11/2017  . Hypercholesteremia   . Hypertension   . NICM (nonischemic cardiomyopathy) ([place])    a.dx 2016. b. 2D echo 06/2016 - Last echo 07/01/16: mod dilated LV, mod LVH, EF 25-30%, mild-mod MR, sev LAE, mild-mod reduced RV systolic function, mild-mod TR, PASP 58mHG.  .Marland KitchenPAF (paroxysmal atrial fibrillation) ([facility])    On amio - ot a candidate for flecainide due to cardiomyopathy, not a candidate for Tikosyn due to prolonged QT, and felt to be a poor candidate for ablation given left atrial size.  . [place]   . Tobacco abuse     Past Surgical History:  Procedure Laterality Date  . BIOPSY  03/11/2019   Procedure: BIOPSY;  Surgeon: CLavena Bullion DO;  [facility]  Service: Gastroenterology;;  . BIOPSY  03/15/2019   Procedure: BIOPSY;  Surgeon: MIrving Copas, MD;  [facility]  Service: Gastroenterology;;  . CARDIOVERSION N/A 07/30/2018   Procedure: CARDIOVERSION;  Surgeon: MLarey Dresser MD;  [facility];  Service: Cardiovascular;  Laterality: N/A;  . CARDIOVERSION N/A 12/17/2020  Procedure: CARDIOVERSION;  Surgeon: Larey Dresser, MD;  Location: [facility] ENDOSCOPY;  Service: Cardiovascular;  Laterality: N/A;  . COLONOSCOPY N/A 03/11/2019   Procedure: COLONOSCOPY;  Surgeon: Lavena Bullion, DO;  Location: [place];  Service: Gastroenterology;  Laterality: N/A;  . ENTEROSCOPY N/A 03/15/2019   Procedure: ENTEROSCOPY;  Surgeon: Rush Landmark Telford Nab., MD;  Location: [place];  Service: Gastroenterology;  Laterality: N/A;   . ESOPHAGOGASTRODUODENOSCOPY Left 03/11/2019   Procedure: ESOPHAGOGASTRODUODENOSCOPY (EGD);  Surgeon: Lavena Bullion, DO;  Location: [facility] ENDOSCOPY;  Service: Gastroenterology;  Laterality: Left;  . LEFT AND RIGHT HEART CATHETERIZATION WITH CORONARY ANGIOGRAM N/A 11/06/2014   Procedure: LEFT AND RIGHT HEART CATHETERIZATION WITH CORONARY ANGIOGRAM;  Surgeon: Jettie Booze, MD;  Location: [facility] CATH LAB;  Service: Cardiovascular;  Laterality: N/A;  . RIGHT/LEFT HEART CATH AND CORONARY ANGIOGRAPHY N/A 12/06/2018   Procedure: RIGHT/LEFT HEART CATH AND CORONARY ANGIOGRAPHY;  Surgeon: Larey Dresser, MD;  Location: [place] CV LAB;  Service: Cardiovascular;  Laterality: N/A;  . SPLENECTOMY    . SUBMUCOSAL TATTOO INJECTION  03/15/2019   Procedure: SUBMUCOSAL TATTOO INJECTION;  Surgeon: Irving Copas., MD;  Location: [place];  Service: Gastroenterology;;  . TEE WITHOUT CARDIOVERSION N/A 12/17/2020   Procedure: TRANSESOPHAGEAL ECHOCARDIOGRAM (TEE);  Surgeon: Larey Dresser, MD;  Location: [facility] ENDOSCOPY;  Service: Cardiovascular;  Laterality: N/A;    Social History:   reports that he quit smoking about 2 years ago. His smoking use included cigarettes. He has a 34.00 pack-year smoking history. He has never used smokeless tobacco. He reports that he does not drink alcohol and does not use drugs.  Allergies  Allergen Reactions  . Isosorb Dinitrate-Hydralazine Shortness Of Breath  . Benazepril Nausea And Vomiting  . Brimonidine Other (See Comments)    Dry eyes  . Metformin Other (See Comments)    Cramps and abdominal pain  . Morphine Rash    Family History  Problem Relation Age of Onset  . Heart disease Mother   . Hypertension Mother   . Heart failure Mother   . Heart disease Father   . Hypertension Sister   . Heart attack Neg Hx   . Stroke Neg Hx   . Colon cancer Neg Hx   . Esophageal cancer Neg Hx      Prior to Admission medications   Medication Sig Start Date End  Date Taking? Authorizing Provider  acetaminophen (TYLENOL) 325 MG tablet Take 2 tablets (650 mg total) by mouth every 4 (four) hours as needed for headache or mild pain. 10/09/20  Yes Vann, Jessica U, DO  albuterol (VENTOLIN HFA) 108 (90 Base) MCG/ACT inhaler Inhale 2 puffs into the lungs every 6 (six) hours as needed for wheezing or shortness of breath.   Yes [provider]  amiodarone (PACERONE) 200 MG tablet Take 1 tablet twice daily for next 2 weeks followed by 1 tablet daily. Patient taking differently: Take 200 mg by mouth daily. 12/19/20  Yes Pahwani, Einar Grad, MD  apixaban (ELIQUIS) 5 MG TABS tablet Take 1 tablet (5 mg total) by mouth 2 (two) times daily. 12/19/20 01/18/21 Yes Pahwani, Einar Grad, MD  atorvastatin (LIPITOR) 40 MG tablet Take 1 tablet (40 mg total) by mouth daily. Patient taking differently: Take 40 mg by mouth at bedtime. 10/30/20  Yes Larey Dresser, MD  Budeson-Glycopyrrol-Formoterol (BREZTRI AEROSPHERE) 160-9-4.8 MCG/ACT AERO Inhale 2 puffs into the lungs 2 (two) times daily. 06/21/20  Yes Tanda Rockers, MD  carbamide peroxide (DEBROX) 6.5 % OTIC solution  Place 5-10 drops into both ears 3 (three) times daily as needed (ear wax).   Yes [provider]  Carboxymethylcellulose Sod PF 0.25 % SOLN Place 1 drop into both eyes 2 (two) times daily as needed (dry eyes).   Yes [provider]  cetirizine (ZYRTEC) 10 MG tablet Take 10 mg by mouth at bedtime.   Yes [provider]  Cholecalciferol (VITAMIN D) 50 MCG (2000 UT) CAPS Take 2,000 Units by mouth daily.   Yes [provider]  diazepam (VALIUM) 10 MG tablet Take 10 mg by mouth 3 (three) times daily as needed for seizure. 11/22/20  Yes [provider]  famotidine (PEPCID) 20 MG tablet TAKE 1 TABLET(20 MG) BY MOUTH TWICE DAILY Patient taking differently: Take 20 mg by mouth 2 (two) times daily as needed for indigestion. 10/29/20  Yes Larey Dresser, MD  feeding supplement, GLUCERNA  SHAKE, (GLUCERNA SHAKE) LIQD Take 237 mLs by mouth 3 (three) times daily between meals. 11/27/20  Yes Swayze, Ava, DO  ferrous sulfate 325 (65 FE) MG tablet Take 325 mg by mouth every Monday, Wednesday, and Friday at 6 PM.   Yes [provider]  fluticasone (FLONASE) 50 MCG/ACT nasal spray Place 2 sprays into both nostrils daily as needed for allergies.   Yes [provider]  hydrocortisone (ANUSOL-HC) 2.5 % rectal cream Place 1 application rectally 2 (two) times daily as needed for hemorrhoids or itching.    Yes [provider]  hydrocortisone 1 % lotion Apply 1 application topically 2 (two) times daily. Back   Yes [provider]  insulin aspart (NOVOLOG) 100 UNIT/ML injection Inject 0-15 Units into the skin 3 (three) times daily with meals. Patient taking differently: Inject 4 Units into the skin 3 (three) times daily with meals. 10/09/20  Yes Vann, Jessica U, DO  insulin aspart protamine- aspart (NOVOLOG MIX 70/30) (70-30) 100 UNIT/ML injection Inject 10 Units into the skin 2 (two) times daily with a meal.   Yes [provider]  Insulin Syringe 27G X 1/2" 0.5 ML MISC 100 Syringes by Does not apply route 3 (three) times daily. 08/29/19  Yes Hosie Poisson, MD  ipratropium-albuterol (DUONEB) 0.5-2.5 (3) MG/3ML SOLN Take 3 mLs by nebulization 4 (four) times daily as needed.   Yes [provider]  latanoprost (XALATAN) 0.005 % ophthalmic solution Place 1 drop into both eyes at bedtime.   Yes [provider]  oxybutynin (DITROPAN) 5 MG tablet Take 1 tablet by mouth 4 (four) times daily as needed for incontinence. 07/03/20  Yes [provider]  oxyCODONE-acetaminophen (PERCOCET) 10-325 MG tablet Take 1 tablet by mouth every 6 (six) hours as needed for pain.   Yes [provider]  OXYGEN Inhale 3 L/min into the lungs continuous.   Yes [provider]  pantoprazole (PROTONIX) 40 MG tablet Take 40 mg by mouth 2 (two) times  daily. 07/03/20  Yes [provider]  polyethylene glycol powder (GLYCOLAX/MIRALAX) 17 GM/SCOOP powder Take 17 g by mouth daily as needed for constipation. 07/03/20  Yes [provider]  potassium chloride SA (KLOR-CON) 20 MEQ tablet Take 1 tablet (20 mEq total) by mouth 2 (two) times daily. Patient taking differently: Take 40 mEq by mouth 2 (two) times daily. 11/09/20 12/09/20 Yes [place], Maricela Bo, FNP  senna-docusate (SENOKOT-S) 8.6-50 MG tablet Take 1 tablet by mouth at bedtime as needed for mild constipation. 07/03/20  Yes [provider]  silver sulfADIAZINE (SILVADENE) 1 % cream Apply topically  daily. Patient taking differently: Apply 1 application topically daily. 11/01/20  Yes Kayleen Memos, DO  sodium chloride (OCEAN) 0.65 % SOLN nasal spray Place 2 sprays into both nostrils as needed for congestion.    Yes [provider]  tamsulosin (FLOMAX) 0.4 MG CAPS capsule Take 1 capsule (0.4 mg total) by mouth daily. Patient taking differently: Take 0.4 mg by mouth daily after breakfast. 10/09/20  Yes Vann, Jessica U, DO  terbinafine (LAMISIL) 1 % cream Apply 1 application topically daily. 03/10/20  Yes [provider]  torsemide 40 MG TABS Take 80 mg by mouth daily. 12/19/20 01/18/21 Yes Pahwani, Einar Grad, MD  lidocaine (LIDODERM) 5 % Place 1 patch onto the skin daily. Remove & Discard patch within 12 hours or as directed by MD Patient not taking: No sig reported 10/10/20   Geradine Girt, DO  umeclidinium bromide (INCRUSE ELLIPTA) 62.5 MCG/INH AEPB Inhale 1 puff into the lungs daily. Patient not taking: No sig reported 11/28/20   Karie Kirks, DO    Physical Exam: Vitals:   01/06/21 0115 01/06/21 0130 01/06/21 0200 01/06/21 0230  BP: (!) 99/47 (!) 107/51 (!) 113/50 119/60  Pulse: 70 67 69 70  Resp: (!) 22 (!) 26 (!) 21 18  Temp:      SpO2: 98% 98% 98% 95%    Constitutional: NAD, calm  Eyes: PERTLA, lids and conjunctivae normal ENMT: Mucous membranes are  moist. Posterior pharynx clear of any exudate or lesions.   Neck: normal, supple, no masses, no thyromegaly Respiratory: Mild tachypnea, rales. No pallor or cyanosis.  Cardiovascular: S1 & S2 heard, regular rate and rhythm. Pretibial pitting edema bilaterally.  Abdomen: No distension, no tenderness, soft. Bowel sounds active.  Musculoskeletal: no clubbing / cyanosis. No joint deformity upper and lower extremities.   Skin: no significant rashes, lesions, ulcers. Warm, dry, well-perfused. Neurologic: CN 2-12 grossly intact. Sensation intact. Moving all extremities.  Psychiatric: Alert and oriented to person, place, and situation. Pleasant and cooperative.    Labs and Imaging on Admission: I have personally reviewed following labs and imaging studies  CBC: Recent Labs  Lab 01/05/21 2213  WBC 4.5  HGB 9.5*  HCT 32.5*  MCV 87.1  PLT 032*   Basic Metabolic Panel: Recent Labs  Lab 01/05/21 2213  NA 143  K 3.5  CL 103  CO2 28  GLUCOSE 78  BUN 49*  CREATININE 2.16*  CALCIUM 8.1*   GFR: CrCl cannot be calculated (Unknown ideal weight.). Liver Function Tests: No results for input(s): AST, ALT, ALKPHOS, BILITOT, PROT, ALBUMIN in the last 168 hours. No results for input(s): LIPASE, AMYLASE in the last 168 hours. No results for input(s): AMMONIA in the last 168 hours. Coagulation Profile: No results for input(s): INR, PROTIME in the last 168 hours. Cardiac Enzymes: No results for input(s): CKTOTAL, CKMB, CKMBINDEX, TROPONINI in the last 168 hours. BNP (last 3 results) No results for input(s): PROBNP in the last 8760 hours. HbA1C: No results for input(s): HGBA1C in the last 72 hours. CBG: No results for input(s): GLUCAP in the last 168 hours. Lipid Profile: No results for input(s): CHOL, HDL, LDLCALC, TRIG, CHOLHDL, LDLDIRECT in the last 72 hours. Thyroid Function Tests: No results for input(s): TSH, T4TOTAL, FREET4, T3FREE, THYROIDAB in the last 72 hours. Anemia Panel: No  results for input(s): VITAMINB12, FOLATE, FERRITIN, TIBC, IRON, RETICCTPCT in the last 72 hours. Urine analysis:    Component Value Date/Time   COLORURINE YELLOW 12/11/2020 1107   APPEARANCEUR HAZY (A)  12/11/2020 1107   LABSPEC 1.020 12/11/2020 1107   PHURINE 5.5 12/11/2020 [place] 12/11/2020 1107   HGBUR NEGATIVE 12/11/2020 1107   [place] 12/11/2020 1107   [place] 12/11/2020 1107   PROTEINUR NEGATIVE 12/11/2020 1107   UROBILINOGEN 0.2 10/26/2014 2206   NITRITE NEGATIVE 12/11/2020 1107   LEUKOCYTESUR NEGATIVE 12/11/2020 1107   Sepsis Labs: _0 (procalcitonin:4,lacticidven:4) )No results found for this or any previous visit (from the past 240 hour(s)).   Radiological Exams on Admission: DG Chest 2 View  Result Date: 01/05/2021 CLINICAL DATA:  Dyspnea, lower extremity edema EXAM: CHEST - 2 VIEW COMPARISON:  12/11/2020 FINDINGS: Pulmonary insufflation is stable the lungs are symmetrically expanded. Moderate left pleural effusion is present. Tiny right pleural effusion is suspected. Diffuse interstitial pulmonary infiltrate is present demonstrating a basilar predominance most in keeping with mild pulmonary edema, likely cardiogenic in nature. Mild to moderate cardiomegaly is unchanged. Central pulmonary arterial enlargement is again noted. No pneumothorax. No acute bone abnormality. IMPRESSION: Mild cardiogenic failure.  Small bilateral pleural effusions. Electronically Signed   By: Fidela Salisbury MD   On: 01/05/2021 23:09    EKG: Independently reviewed. Sinus or ectopic atrial rhythm with incomplete RBBB, QTc 522.    Assessment/Plan   1. Acute on chronic systolic & diastolic CHF, RV failure; acute on chronic hypoxic respiratory failure  - Presents with increased SOB, leg swelling, and weight-gain and is found to be in acute CHF  - He was given 80 mg IV Lasix in ED and is diuresing  - Continue diuresis with Lasix 60 mg IV q12h, monitor  weight and I/Os, monitor renal function and electrolytes    2. CKD IIIb  - SCr is 2.16 on admission, up from apparent baseline of ~1.9  - Renally-dose medications, monitor closely while diuresing    3. Paroxysmal atrial fibrillation  - In sinus rhythm on admission, status-post electrical cardioversion on 2/14  - CHADS-VASc is 4 (age, CHF, DM, CAD)  - Continue Eliquis, hold amiodarone pending repeat EKG (prolonged QT)   4. Insulin-dependent DM  - A1c was 6.8% in February 2022  - Continue CBG checks and insulin    5. Anemia  - Hgb is 9.5, down from 10.4 last month without overt bleeding, drop likely dilutional  - Monitor    6. COPD; OSA  - No wheezing on admission  - Continue ICS/LABA/LAMA, continue supplemental O2, BiPAP qHS   7. Elevated troponin  - HS troponin mildly elevated without chest pain  - He has hx of non-obstructive CAD  - Likely demand ischemic related to acute CHF  - Continue diuresis and supplemental O2    8. Prolonged QT interval  - QTc is 522 ms on admission  - Continue cardiac monitoring, minimize QT-prolonging medications, monitor/replete electrolytes, repeat EKG in am    DVT prophylaxis: Eliquis  Code Status: Full  Level of Care: Level of care: Telemetry Cardiac Family Communication: None present  Disposition Plan:  Patient is from: Home  Anticipated d/c is to: TBD Anticipated d/c date is: 01/09/21 Patient currently: Pending improvement with diuresis, needs close monitoring of renal function  Consults called: none  Admission status: Inpatient     Vianne Bulls, MD [facility]  01/06/2021, 4:01 AM

## 2021-01-06 NOTE — ED Provider Notes (Signed)
[facility] EMERGENCY DEPARTMENT Provider Note   CSN: [id number] Arrival date & time: 01/05/21  2209     History Chief Complaint  Patient presents with  . Shortness of Breath    Nicholas Caldwell is a 75 y.o. male.  Patient presents to the emergency department for evaluation of multiple problems.  Patient is here primarily with shortness of breath.  Patient reports that he has been having difficulty breathing despite using his normal oxygen which is 3 L by nasal cannula continuously.  Patient reports that symptoms have progressively worsened over this week.  Over this period of time he has gained 10 pounds and has noticed increased swelling in both of his legs.  Patient reports that he is no longer ambulatory.  He used to walk without assistance and then was using a walker for a while, but over the last 1 or 2 weeks he can barely support his own weight.  He is experiencing numbness on both feet.  He does report history of peripheral neuropathy.        Past Medical History:  Diagnosis Date  . Atrial flutter ([place])    a. recurrent AFlutter with RVR;  b. Amiodarone Rx started 4/16  . CAD (coronary artery disease)    a. LHC 1/16:  mLAD diffuse disease, pLCx mild disease, dLCx with disease but too small for PCI, RCA ok, EF 25-30%  . Chronic pain   . Chronic systolic CHF (congestive heart failure) ([place])   . COPD (chronic obstructive pulmonary disease) ([place])   . Diabetes mellitus without complication ([place])   . Ex-smoker 11/2017  . Hypercholesteremia   . Hypertension   . NICM (nonischemic cardiomyopathy) ([place])    a.dx 2016. b. 2D echo 06/2016 - Last echo 07/01/16: mod dilated LV, mod LVH, EF 25-30%, mild-mod MR, sev LAE, mild-mod reduced RV systolic function, mild-mod TR, PASP 15mHG.  .Marland KitchenPAF (paroxysmal atrial fibrillation) (HCC)    On amio - ot a candidate for flecainide due to cardiomyopathy, not a candidate for Tikosyn due to prolonged QT, and felt to be a poor candidate  for ablation given left atrial size.  . Pulmonary hypertension (HGaines   . Tobacco abuse     Patient Active Problem List   Diagnosis Date Noted  . Chronic kidney disease, stage 3b (HTrappe 01/06/2021  . Severe sepsis (HHanaford 12/11/2020  . Acute renal failure superimposed on stage 4 chronic kidney disease (HPapaikou 12/11/2020  . Abdominal pain 12/11/2020  . Stage III pressure ulcer of sacral region (HWest [place] 10/01/2020  . Acute systolic CHF (congestive heart failure) (HBattlement Mesa 09/25/2020  . Acute respiratory failure with hypercapnia (HGranville   . Encounter for intubation   . Pressure injury of skin 08/21/2019  . Cardiac arrest (HCoosada 08/20/2019  . Palliative care by specialist   . DNR (do not resuscitate) discussion   . Fatigue   . Acute CHF (congestive heart failure) (HOasis 07/07/2019  . Acute on chronic heart failure (HFreeborn 06/09/2019  . Hyperkalemia 04/10/2019  . Cerebral embolism with cerebral infarction 03/21/2019  . Gastritis and gastroduodenitis   . NSVT (nonsustained ventricular tachycardia) (HMayo 03/08/2019  . Tobacco abuse counseling 03/08/2019  . Iron deficiency anemia   . Acute on chronic systolic CHF (congestive heart failure) (HTeague 03/06/2019  . Diabetes mellitus type 2 in obese (HAltamont   . Primary osteoarthritis of right hip   . Hypomagnesemia   . Steroid-induced hyperglycemia   . Supplemental oxygen dependent   . Leukocytosis   . Hypokalemia   .  Acute on chronic anemia   . Diabetic peripheral neuropathy ([place])   . Acute on chronic systolic (congestive) heart failure ([place])   . Acute on chronic respiratory failure ([place]) 01/14/2019  . Hypoxia   . Altered mental status   . RVF (right ventricular failure) ([place]) 12/30/2018  . AKI (acute kidney injury) ([place])   . Acute respiratory failure ([place]) 11/22/2018  . Acute on chronic respiratory failure with hypoxia and hypercapnia ([place]) 11/13/2018  . Metabolic encephalopathy 24/81/8590  . Acute respiratory failure with hypoxia and hypercapnia ([place])  07/26/2018  . Acute exacerbation of CHF (congestive heart failure) ([place]) 07/26/2018  . CHF exacerbation ([place]) 05/01/2018  . CHF (congestive heart failure) ([place]) 04/30/2018  . Chronic respiratory failure with hypoxia ([place]) 12/28/2017  . Acute metabolic encephalopathy   . Atrial fibrillation with RVR ([place])   . SVT (supraventricular tachycardia) ([place])   . COPD GOLD 0   . Medically noncompliant   . Panlobular emphysema ([place])   . OSA (obstructive sleep apnea)   . Pulmonary hypertension ([place])   . Disorientation   . Pressure injury of skin 09/07/2016  . Acute on chronic respiratory failure with hypoxia ([place]) 09/05/2016  . Acute on chronic combined systolic and diastolic CHF (congestive heart failure) ([place]) 09/05/2016  . Skin lesion-left heal 09/05/2016  . Chronic systolic CHF (congestive heart failure) ([place]) 07/16/2016  . COPD (chronic obstructive pulmonary disease) ([place]) 07/16/2016  . Uncontrolled type 2 diabetes mellitus with complication ([place])   . Diabetic polyneuropathy associated with diabetes mellitus due to underlying condition ([place])   . Normocytic anemia 06/29/2016  . CAD - Non-obstructive by LHC1/16 01/21/2016  . Prolonged QT interval 10/09/2015  . Nonischemic cardiomyopathy ([place]) 10/09/2015  . PAF (paroxysmal atrial fibrillation) ([place])   . Chronic pain 02/19/2015  . Morbid obesity ([place]) 02/13/2015  . Dyspnea   . Elevated troponin I level 11/01/2014  . COPD exacerbation ([place]) 11/01/2014  . Essential hypertension 10/31/2014  . Type 2 diabetes mellitus with neuropathy 10/31/2014  . Hyperlipidemia  10/31/2014  . Cigarette smoker 10/31/2014    Past Surgical History:  Procedure Laterality Date  . BIOPSY  03/11/2019   Procedure: BIOPSY;  Surgeon: Lavena Bullion, DO;  Location: [place];  Service: Gastroenterology;;  . BIOPSY  03/15/2019   Procedure: BIOPSY;  Surgeon: Irving Copas., MD;  Location: [place];  Service: Gastroenterology;;  . CARDIOVERSION N/A  07/30/2018   Procedure: CARDIOVERSION;  Surgeon: Larey Dresser, MD;  Location: [facility] ENDOSCOPY;  Service: Cardiovascular;  Laterality: N/A;  . CARDIOVERSION N/A 12/17/2020   Procedure: CARDIOVERSION;  Surgeon: Larey Dresser, MD;  Location: [facility] ENDOSCOPY;  Service: Cardiovascular;  Laterality: N/A;  . COLONOSCOPY N/A 03/11/2019   Procedure: COLONOSCOPY;  Surgeon: Lavena Bullion, DO;  Location: [place];  Service: Gastroenterology;  Laterality: N/A;  . ENTEROSCOPY N/A 03/15/2019   Procedure: ENTEROSCOPY;  Surgeon: Rush Landmark Telford Nab., MD;  Location: [place];  Service: Gastroenterology;  Laterality: N/A;  . ESOPHAGOGASTRODUODENOSCOPY Left 03/11/2019   Procedure: ESOPHAGOGASTRODUODENOSCOPY (EGD);  Surgeon: Lavena Bullion, DO;  Location: [facility] ENDOSCOPY;  Service: Gastroenterology;  Laterality: Left;  . LEFT AND RIGHT HEART CATHETERIZATION WITH CORONARY ANGIOGRAM N/A 11/06/2014   Procedure: LEFT AND RIGHT HEART CATHETERIZATION WITH CORONARY ANGIOGRAM;  Surgeon: Jettie Booze, MD;  Location: [facility] CATH LAB;  Service: Cardiovascular;  Laterality: N/A;  . RIGHT/LEFT HEART CATH AND CORONARY ANGIOGRAPHY N/A 12/06/2018   Procedure: RIGHT/LEFT HEART CATH AND CORONARY ANGIOGRAPHY;  Surgeon: Larey Dresser, MD;  Location:  [place] INVASIVE CV LAB;  Service: Cardiovascular;  Laterality: N/A;  . SPLENECTOMY    . SUBMUCOSAL TATTOO INJECTION  03/15/2019   Procedure: SUBMUCOSAL TATTOO INJECTION;  Surgeon: Irving Copas., MD;  Location: [place];  Service: Gastroenterology;;  . TEE WITHOUT CARDIOVERSION N/A 12/17/2020   Procedure: TRANSESOPHAGEAL ECHOCARDIOGRAM (TEE);  Surgeon: Larey Dresser, MD;  Location: [facility] ENDOSCOPY;  Service: Cardiovascular;  Laterality: N/A;       Family History  Problem Relation Age of Onset  . Heart disease Mother   . Hypertension Mother   . Heart failure Mother   . Heart disease Father   . Hypertension Sister   . Heart attack Neg Hx   . Stroke Neg Hx   .  Colon cancer Neg Hx   . Esophageal cancer Neg Hx     Social History   Tobacco Use  . Smoking status: Former Smoker    Packs/day: 1.00    Years: 34.00    Pack years: 34.00    Types: Cigarettes    Quit date: 11/30/2018    Years since quitting: 2.1  . Smokeless tobacco: Never Used  Vaping Use  . Vaping Use: Never used  Substance Use Topics  . Alcohol use: No    Alcohol/week: 0.0 standard drinks  . Drug use: No    Home Medications Prior to Admission medications   Medication Sig Start Date End Date Taking? Authorizing Provider  acetaminophen (TYLENOL) 325 MG tablet Take 2 tablets (650 mg total) by mouth every 4 (four) hours as needed for headache or mild pain. 10/09/20  Yes Vann, Jessica U, DO  albuterol (VENTOLIN HFA) 108 (90 Base) MCG/ACT inhaler Inhale 2 puffs into the lungs every 6 (six) hours as needed for wheezing or shortness of breath.   Yes [provider]  amiodarone (PACERONE) 200 MG tablet Take 1 tablet twice daily for next 2 weeks followed by 1 tablet daily. Patient taking differently: Take 200 mg by mouth daily. 12/19/20  Yes Pahwani, Einar Grad, MD  apixaban (ELIQUIS) 5 MG TABS tablet Take 1 tablet (5 mg total) by mouth 2 (two) times daily. 12/19/20 01/18/21 Yes Pahwani, Einar Grad, MD  atorvastatin (LIPITOR) 40 MG tablet Take 1 tablet (40 mg total) by mouth daily. Patient taking differently: Take 40 mg by mouth at bedtime. 10/30/20  Yes Larey Dresser, MD  Budeson-Glycopyrrol-Formoterol (BREZTRI AEROSPHERE) 160-9-4.8 MCG/ACT AERO Inhale 2 puffs into the lungs 2 (two) times daily. 06/21/20  Yes Tanda Rockers, MD  carbamide peroxide (DEBROX) 6.5 % OTIC solution Place 5-10 drops into both ears 3 (three) times daily as needed (ear wax).   Yes [provider]  Carboxymethylcellulose Sod PF 0.25 % SOLN Place 1 drop into both eyes 2 (two) times daily as needed (dry eyes).   Yes [provider]  cetirizine (ZYRTEC) 10 MG tablet Take 10 mg by mouth at bedtime.   Yes  [provider]  Cholecalciferol (VITAMIN D) 50 MCG (2000 UT) CAPS Take 2,000 Units by mouth daily.   Yes [provider]  diazepam (VALIUM) 10 MG tablet Take 10 mg by mouth 3 (three) times daily as needed for seizure. 11/22/20  Yes [provider]  famotidine (PEPCID) 20 MG tablet TAKE 1 TABLET(20 MG) BY MOUTH TWICE DAILY Patient taking differently: Take 20 mg by mouth 2 (two) times daily as needed for indigestion. 10/29/20  Yes Larey Dresser, MD  feeding supplement, GLUCERNA SHAKE, (GLUCERNA SHAKE) LIQD Take 237 mLs by mouth 3 (three) times  daily between meals. 11/27/20  Yes Swayze, Ava, DO  ferrous sulfate 325 (65 FE) MG tablet Take 325 mg by mouth every Monday, Wednesday, and Friday at 6 PM.   Yes [provider]  fluticasone (FLONASE) 50 MCG/ACT nasal spray Place 2 sprays into both nostrils daily as needed for allergies.   Yes [provider]  hydrocortisone (ANUSOL-HC) 2.5 % rectal cream Place 1 application rectally 2 (two) times daily as needed for hemorrhoids or itching.    Yes [provider]  hydrocortisone 1 % lotion Apply 1 application topically 2 (two) times daily. Back   Yes [provider]  insulin aspart (NOVOLOG) 100 UNIT/ML injection Inject 0-15 Units into the skin 3 (three) times daily with meals. Patient taking differently: Inject 4 Units into the skin 3 (three) times daily with meals. 10/09/20  Yes Vann, Jessica U, DO  insulin aspart protamine- aspart (NOVOLOG MIX 70/30) (70-30) 100 UNIT/ML injection Inject 10 Units into the skin 2 (two) times daily with a meal.   Yes [provider]  Insulin Syringe 27G X 1/2" 0.5 ML MISC 100 Syringes by Does not apply route 3 (three) times daily. 08/29/19  Yes Hosie Poisson, MD  ipratropium-albuterol (DUONEB) 0.5-2.5 (3) MG/3ML SOLN Take 3 mLs by nebulization 4 (four) times daily as needed.   Yes [provider]  latanoprost (XALATAN) 0.005 % ophthalmic solution  Place 1 drop into both eyes at bedtime.   Yes [provider]  oxybutynin (DITROPAN) 5 MG tablet Take 1 tablet by mouth 4 (four) times daily as needed for incontinence. 07/03/20  Yes [provider]  oxyCODONE-acetaminophen (PERCOCET) 10-325 MG tablet Take 1 tablet by mouth every 6 (six) hours as needed for pain.   Yes [provider]  OXYGEN Inhale 3 L/min into the lungs continuous.   Yes [provider]  pantoprazole (PROTONIX) 40 MG tablet Take 40 mg by mouth 2 (two) times daily. 07/03/20  Yes [provider]  polyethylene glycol powder (GLYCOLAX/MIRALAX) 17 GM/SCOOP powder Take 17 g by mouth daily as needed for constipation. 07/03/20  Yes [provider]  potassium chloride SA (KLOR-CON) 20 MEQ tablet Take 1 tablet (20 mEq total) by mouth 2 (two) times daily. Patient taking differently: Take 40 mEq by mouth 2 (two) times daily. 11/09/20 12/09/20 Yes Milford, Maricela Bo, FNP  senna-docusate (SENOKOT-S) 8.6-50 MG tablet Take 1 tablet by mouth at bedtime as needed for mild constipation. 07/03/20  Yes [provider]  silver sulfADIAZINE (SILVADENE) 1 % cream Apply topically daily. Patient taking differently: Apply 1 application topically daily. 11/01/20  Yes Kayleen Memos, DO  sodium chloride (OCEAN) 0.65 % SOLN nasal spray Place 2 sprays into both nostrils as needed for congestion.    Yes [provider]  tamsulosin (FLOMAX) 0.4 MG CAPS capsule Take 1 capsule (0.4 mg total) by mouth daily. Patient taking differently: Take 0.4 mg by mouth daily after breakfast. 10/09/20  Yes Vann, Jessica U, DO  terbinafine (LAMISIL) 1 % cream Apply 1 application topically daily. 03/10/20  Yes [provider]  torsemide 40 MG TABS Take 80 mg by mouth daily. 12/19/20 01/18/21 Yes Pahwani, Einar Grad, MD  lidocaine (LIDODERM) 5 % Place 1 patch onto the skin daily. Remove & Discard patch within 12 hours or as directed by MD Patient not taking: No sig  reported 10/10/20   Geradine Girt, DO  umeclidinium bromide (INCRUSE ELLIPTA) 62.5 MCG/INH AEPB Inhale 1 puff into the lungs daily. Patient not taking:  No sig reported 11/28/20   Swayze, Ava, DO    Allergies    Isosorb dinitrate-hydralazine, Benazepril, Brimonidine, Metformin, and Morphine  Review of Systems   Review of Systems  Respiratory: Positive for shortness of breath.   Cardiovascular: Positive for leg swelling.  Neurological: Positive for weakness and numbness (Feet).  All other systems reviewed and are negative.   Physical Exam Updated Vital Signs BP (!) 131/56 (BP Location: Right Arm)   Pulse 71   Temp 98.6 F (37 C) (Oral)   Resp 19   Wt 105.8 kg   SpO2 99%   BMI 31.63 kg/m   Physical Exam Vitals and nursing note reviewed.  Constitutional:      General: He is not in acute distress.    Appearance: Normal appearance. He is well-developed and well-nourished.  HENT:     Head: Normocephalic and atraumatic.     Right Ear: Hearing normal.     Left Ear: Hearing normal.     Nose: Nose normal.     Mouth/Throat:     Mouth: Oropharynx is clear and moist and mucous membranes are normal.  Eyes:     Extraocular Movements: EOM normal.     Conjunctiva/sclera: Conjunctivae normal.     Pupils: Pupils are equal, round, and reactive to light.  Cardiovascular:     Rate and Rhythm: Regular rhythm.     Heart sounds: S1 normal and S2 normal. No murmur heard. No friction rub. No gallop.   Pulmonary:     Effort: Pulmonary effort is normal. Tachypnea present. No respiratory distress.     Breath sounds: Decreased breath sounds and rales present.  Chest:     Chest wall: No tenderness.  Abdominal:     General: Bowel sounds are normal.     Palpations: Abdomen is soft. There is no hepatosplenomegaly.     Tenderness: There is no abdominal tenderness. There is no guarding or rebound. Negative signs include Murphy's sign and McBurney's sign.     Hernia: No hernia is present.   Musculoskeletal:        General: Normal range of motion.     Cervical back: Normal range of motion and neck supple.  Skin:    General: Skin is warm, dry and intact.     Findings: No rash.     Nails: There is no cyanosis.  Neurological:     Mental Status: He is alert and oriented to person, place, and time.     GCS: GCS eye subscore is 4. GCS verbal subscore is 5. GCS motor subscore is 6.     Cranial Nerves: No cranial nerve deficit.     Sensory: No sensory deficit.     Coordination: Coordination normal.     Deep Tendon Reflexes: Strength normal.  Psychiatric:        Mood and Affect: Mood and affect normal.        Speech: Speech normal.        Behavior: Behavior normal.        Thought Content: Thought content normal.     ED Results / Procedures / Treatments   Labs (all labs ordered are listed, but only abnormal results are displayed) Labs Reviewed  BASIC METABOLIC PANEL - Abnormal; Notable for the following components:      Result Value   BUN 49 (*)    Creatinine, Ser 2.16 (*)    Calcium 8.1 (*)    GFR, Estimated 32 (*)    All other components within  normal limits  CBC - Abnormal; Notable for the following components:   RBC 3.73 (*)    Hemoglobin 9.5 (*)    HCT 32.5 (*)    MCH 25.5 (*)    MCHC 29.2 (*)    RDW 19.8 (*)    Platelets 515 (*)    All other components within normal limits  BRAIN NATRIURETIC PEPTIDE - Abnormal; Notable for the following components:   B Natriuretic Peptide 3,522.2 (*)    All other components within normal limits  CBC - Abnormal; Notable for the following components:   RBC 3.16 (*)    Hemoglobin 8.2 (*)    HCT 27.6 (*)    MCH 25.9 (*)    MCHC 29.7 (*)    RDW 19.7 (*)    Platelets 478 (*)    All other components within normal limits  TROPONIN I (HIGH SENSITIVITY) - Abnormal; Notable for the following components:   Troponin I (High Sensitivity) 33 (*)    All other components within normal limits  TROPONIN I (HIGH SENSITIVITY) -  Abnormal; Notable for the following components:   Troponin I (High Sensitivity) 33 (*)    All other components within normal limits  RESP PANEL BY RT-PCR (FLU A&B, COVID) ARPGX2  MRSA PCR SCREENING  MAGNESIUM  GLUCOSE, CAPILLARY    EKG EKG Interpretation  Date/Time:  Saturday January 05 2021 22:21:44 EST Ventricular Rate:  73 PR Interval:  186 QRS Duration: 110 QT Interval:  474 QTC Calculation: 522 R Axis:   105 Text Interpretation: Unusual P axis, possible ectopic atrial rhythm Rightward axis Low voltage QRS Incomplete right bundle branch block Nonspecific ST and T wave abnormality Prolonged QT Abnormal ECG No significant change since last tracing Confirmed by Orpah Greek [phone]) on 01/05/2021 11:58:38 PM   Radiology DG Chest 2 View  Result Date: 01/05/2021 CLINICAL DATA:  Dyspnea, lower extremity edema EXAM: CHEST - 2 VIEW COMPARISON:  12/11/2020 FINDINGS: Pulmonary insufflation is stable the lungs are symmetrically expanded. Moderate left pleural effusion is present. Tiny right pleural effusion is suspected. Diffuse interstitial pulmonary infiltrate is present demonstrating a basilar predominance most in keeping with mild pulmonary edema, likely cardiogenic in nature. Mild to moderate cardiomegaly is unchanged. Central pulmonary arterial enlargement is again noted. No pneumothorax. No acute bone abnormality. IMPRESSION: Mild cardiogenic failure.  Small bilateral pleural effusions. Electronically Signed   By: Fidela Salisbury MD   On: 01/05/2021 23:09    Procedures Procedures   Medications Ordered in ED Medications  atorvastatin (LIPITOR) tablet 40 mg (has no administration in time range)  famotidine (PEPCID) tablet 20 mg (has no administration in time range)  oxybutynin (DITROPAN) tablet 5 mg (has no administration in time range)  tamsulosin (FLOMAX) capsule 0.4 mg (has no administration in time range)  apixaban (ELIQUIS) tablet 5 mg (has no administration in time range)   feeding supplement (GLUCERNA SHAKE) (GLUCERNA SHAKE) liquid 237 mL (has no administration in time range)  potassium chloride SA (KLOR-CON) CR tablet 20 mEq (has no administration in time range)  LORazepam (ATIVAN) injection 1 mg (has no administration in time range)  0.9 %  sodium chloride infusion (has no administration in time range)  acetaminophen (TYLENOL) tablet 650 mg (has no administration in time range)  furosemide (LASIX) injection 60 mg (has no administration in time range)  insulin aspart (novoLOG) injection 0-9 Units (0 Units Subcutaneous Not Given 01/06/21 0640)  oxyCODONE-acetaminophen (PERCOCET/ROXICET) 5-325 MG per tablet 1 tablet (1 tablet Oral Given 01/06/21 0542)  And  oxyCODONE (Oxy IR/ROXICODONE) immediate release tablet 5 mg (5 mg Oral Given 01/06/21 0542)  mometasone-formoterol (DULERA) 100-5 MCG/ACT inhaler 1 puff (has no administration in time range)    And  umeclidinium bromide (INCRUSE ELLIPTA) 62.5 MCG/INH 1 puff (has no administration in time range)  furosemide (LASIX) injection 80 mg (80 mg Intravenous Given 01/06/21 0026)  magnesium sulfate IVPB 1 g 100 mL (1 g Intravenous New Bag/Given 01/06/21 0428)  potassium chloride 10 mEq in 100 mL IVPB (10 mEq Intravenous New Bag/Given 01/06/21 0536)    ED Course  I have reviewed the triage vital signs and the nursing notes.  Pertinent labs & imaging results that were available during my care of the patient were reviewed by me and considered in my medical decision making (see chart for details).    MDM Rules/Calculators/A&P                          Presents to the emergency department for evaluation of shortness of breath.  Patient reports that he has been experiencing increased swelling of his legs and has noted a 10 pound weight gain.  At arrival to the emergency department, patient has an increased oxygen demand.  Saturation is better on 6 L by nasal cannula.  Work-up is consistent with volume overload.  Diuresis initiated  with IV Lasix, will admit patient for further treatment.  Final Clinical Impression(s) / ED Diagnoses Final diagnoses:  Acute on chronic congestive heart failure, unspecified heart failure type [facility])    Rx / DC Orders ED Discharge Orders    None       Rhonda Linan, Gwenyth Allegra, MD 01/06/21 [id number]

## 2021-01-06 NOTE — Progress Notes (Signed)
75 year old community dwelling black male COPD OSA on BiPAP Chronic systolic heart failure 2/2 and ICM + RV failure + severe pulmonary HTN Nonobstructive CAD on cath 12/2018 PEA arrest 08/2019?  Narcotic over use and under use of right  IDDM P A. fib/flutter + DCCV 12/17/2020 Dr. Algernon Huxley (cannot tolerate Tikosyn)-failed amiodarone   Return [facility] 3/6 AM S OB PND +10 pounds weight gain-baseline O2 rec = now 6 L up from 3 -Afebrile tachypneic incomplete RBBB with prolonged QTC CXR = CHF BNP 3522 troponin 33 Started on Lasix IV 80  BP (!) 131/56 (BP Location: Right Arm)   Pulse 71   Temp 98.6 F (37 C) (Oral)   Resp 19   Wt 105.8 kg   SpO2 92%   BMI 31.63 kg/m  Awake coherent no distress EOMI NCAT mild JVD Abdomen soft nontender no rebound no guarding Duskiness to toe on the right side Power decreased right compared to left to foot as well as hip flexors    Acute decompensated multifactorial heart failure (severe pulmonary HTN right-sided failure) ATN superimposed on 3B versus cardiorenal syndrome P A. fib CHADS2 score >4---amiodarone held  QTC prolonged, paced rhythm--ask Cardiology to comment  Lasix 60 IV twice daily Bilateral lower extremity pain and weakness that was sudden onset  Tells me he has "no answers" regarding why he became weak suddenly  On exam when I examined him he has a slight right foot drop and he also has  duskiness of his second toe  From his recollection he is never had a recent fall although he does have diabetes and  asked me if this is from polyneuropathy  While it may be from polyneuropathy I am more concerned about this being a foot  drop from a central cause and we will asked therapy to see the patient to do manual  muscle testing-if this is confirmed he will need PR AFO and I will speak to neurology  about him  IDDM A1c 6.8-sliding scale coverage at this time Dilutional anemia COPD OSA Mild elevation troponin Prolonged QTC   Data White count 5.2  hemoglobin 8.2 platelet 478 BUNs/creatinine 37/2.1-->49/2.1 Magnesium 2.0

## 2021-01-06 NOTE — Evaluation (Signed)
Physical Therapy Evaluation Patient Details Name: Nicholas Caldwell MRN: [id number] DOB: 07/16/1946 Today's Date: 01/06/2021   History of Present Illness  Pt is a 75 y/o male admitted 3/5 secondary to worsening LE edema and SOB. Thought to be secondary to CHF exacerbation. PMH includes a flutter, CAD, CHF, COPD, DM, and HTN.  Clinical Impression  Pt admitted secondary to problem above with deficits below. Limited this session secondary to increased SOB. Oxygen sats at 88-91% on 4L throughout session. Was able to take side steps at EOB, however, noted functional weakness in RLE as it tended to buckle on pt. Required min A for steadying assist throughout. Pt requesting to go home at d/c and refusing SNF at this time. Will continue to follow acutely to maximize functional mobility independence and safety.     Follow Up Recommendations Home health PT;Supervision/Assistance - 24 hour (Pt refusing SNF)    Equipment Recommendations  None recommended by PT    Recommendations for Other Services       Precautions / Restrictions Precautions Precautions: Fall Restrictions Weight Bearing Restrictions: No      Mobility  Bed Mobility Overal bed mobility: Needs Assistance Bed Mobility: Supine to Sit;Sit to Supine     Supine to sit: Mod assist Sit to supine: Mod assist   General bed mobility comments: Mod A for trunk and LE assist. Pt required seated rest at EOB secondary to fatigue and SOB.    Transfers Overall transfer level: Needs assistance Equipment used: Rolling walker (2 wheeled) Transfers: Sit to/from Stand Sit to Stand: Min assist;From elevated surface         General transfer comment: Min A for lift assist and steadying from elevated surface. Able to take side steps at EOB with RW, but limited by increased SOB. Oxygen sats ranging from 88-91% on 4L. Pt also with RLE buckling.  Ambulation/Gait                Stairs            Wheelchair Mobility    Modified  Rankin (Stroke Patients Only)       Balance Overall balance assessment: Needs assistance Sitting-balance support: No upper extremity supported Sitting balance-Leahy Scale: Fair     Standing balance support: Bilateral upper extremity supported Standing balance-Leahy Scale: Poor Standing balance comment: Reliant on BUE support                             Pertinent Vitals/Pain Pain Assessment: Faces Faces Pain Scale: Hurts even more Pain Location: R hip Pain Descriptors / Indicators: Aching Pain Intervention(s): Limited activity within patient's tolerance;Monitored during session;Repositioned    Home Living Family/patient expects to be discharged to:: Private residence Living Arrangements: Spouse/significant other Available Help at Discharge: Family;Available 24 hours/day Type of Home: House Home Access: Stairs to enter Entrance Stairs-Rails: None Entrance Stairs-Number of Steps: 1 Home Layout: One level Home Equipment: Walker - 2 wheels;Wheelchair - manual;Bedside commode;Grab bars - tub/shower;Shower seat;Electric scooter;Walker - 4 wheels Additional Comments: on 3.5 L of oxygen at home    Prior Function Level of Independence: Needs assistance   Gait / Transfers Assistance Needed: Has been able to stand and transfer to [facility] using RW.  ADL's / Homemaking Assistance Needed: Spouse assists with LB ADLs        Hand Dominance   Dominant Hand: Right    Extremity/Trunk Assessment   Upper Extremity Assessment Upper Extremity Assessment: Defer to OT evaluation  Lower Extremity Assessment Lower Extremity Assessment: Generalized weakness;RLE deficits/detail RLE Deficits / Details: Noted R hip pain and R knee buckling during mobility.    Cervical / Trunk Assessment Cervical / Trunk Assessment: Kyphotic  Communication   Communication: No difficulties  Cognition Arousal/Alertness: Awake/alert Behavior During Therapy: WFL for tasks  assessed/performed Overall Cognitive Status: History of cognitive impairments - at baseline                                 General Comments: Pt seems close to his baseline. Agreeable to working with therapy      General Comments      Exercises     Assessment/Plan    PT Assessment Patient needs continued PT services  PT Problem List Decreased strength;Decreased mobility;Decreased safety awareness;Decreased range of motion;Decreased knowledge of precautions;Decreased activity tolerance;Cardiopulmonary status limiting activity;Decreased balance;Decreased knowledge of use of DME;Pain;Decreased cognition       PT Treatment Interventions DME instruction;Therapeutic activities;Gait training;Patient/family education;Therapeutic exercise;Balance training;Functional mobility training    PT Goals (Current goals can be found in the Care Plan section)  Acute Rehab PT Goals Patient Stated Goal: to feel better PT Goal Formulation: With patient Time For Goal Achievement: 01/20/21 Potential to Achieve Goals: Fair    Frequency Min 3X/week   Barriers to discharge        Co-evaluation               AM-PAC PT "6 Clicks" Mobility  Outcome Measure Help needed turning from your back to your side while in a flat bed without using bedrails?: A Little Help needed moving from lying on your back to sitting on the side of a flat bed without using bedrails?: A Lot Help needed moving to and from a bed to a chair (including a wheelchair)?: A Lot Help needed standing up from a chair using your arms (e.g., wheelchair or bedside chair)?: A Little Help needed to walk in hospital room?: A Little Help needed climbing 3-5 steps with a railing? : A Lot 6 Click Score: 15    End of Session Equipment Utilized During Treatment: Oxygen;Gait belt Activity Tolerance: Patient tolerated treatment well Patient left: in bed;with call bell/phone within reach;with bed alarm set Nurse Communication:  Mobility status PT Visit Diagnosis: Unsteadiness on feet (R26.81);Muscle weakness (generalized) (M62.81);Difficulty in walking, not elsewhere classified (R26.2)    Time: 8403-3533 PT Time Calculation (min) (ACUTE ONLY): 21 min   Charges:   PT Evaluation $PT Eval Moderate Complexity: 1 Mod          Reuel Derby, PT, DPT  Acute Rehabilitation Services  Pager: [phone] Office: [phone]   Rudean Hitt 01/06/2021, 1:40 PM

## 2021-01-07 DIAGNOSIS — R29898 Other symptoms and signs involving the musculoskeletal system: Secondary | ICD-10-CM

## 2021-01-07 DIAGNOSIS — I4892 Unspecified atrial flutter: Secondary | ICD-10-CM

## 2021-01-07 DIAGNOSIS — I5043 Acute on chronic combined systolic (congestive) and diastolic (congestive) heart failure: Secondary | ICD-10-CM | POA: Diagnosis not present

## 2021-01-07 LAB — BASIC METABOLIC PANEL
Anion gap: 11 (ref 5–15)
BUN: 47 mg/dL — ABNORMAL HIGH (ref 8–23)
CO2: 30 mmol/L (ref 22–32)
Calcium: 8.3 mg/dL — ABNORMAL LOW (ref 8.9–10.3)
Chloride: 100 mmol/L (ref 98–111)
Creatinine, Ser: 2.11 mg/dL — ABNORMAL HIGH (ref 0.61–1.24)
GFR, Estimated: 33 mL/min — ABNORMAL LOW (ref >60)
Glucose, Bld: 130 mg/dL — ABNORMAL HIGH (ref 70–99)
Potassium: 3.7 mmol/L (ref 3.5–5.1)
Sodium: 141 mmol/L (ref 135–145)

## 2021-01-07 LAB — CBC
HCT: 29.1 % — ABNORMAL LOW (ref 39.0–52.0)
Hemoglobin: 8.5 g/dL — ABNORMAL LOW (ref 13.0–17.0)
MCH: 25.3 pg — ABNORMAL LOW (ref 26.0–34.0)
MCHC: 29.2 g/dL — ABNORMAL LOW (ref 30.0–36.0)
MCV: 86.6 fL (ref 80.0–100.0)
Platelets: 460 10*3/uL — ABNORMAL HIGH (ref 150–400)
RBC: 3.36 MIL/uL — ABNORMAL LOW (ref 4.22–5.81)
RDW: 19.6 % — ABNORMAL HIGH (ref 11.5–15.5)
WBC: 5.3 10*3/uL (ref 4.0–10.5)
nRBC: 0 % (ref 0.0–0.2)

## 2021-01-07 LAB — GLUCOSE, CAPILLARY
Glucose-Capillary: 105 mg/dL — ABNORMAL HIGH (ref 70–99)
Glucose-Capillary: 128 mg/dL — ABNORMAL HIGH (ref 70–99)
Glucose-Capillary: 120 mg/dL — ABNORMAL HIGH (ref 70–99)
Glucose-Capillary: 119 mg/dL — ABNORMAL HIGH (ref 70–99)

## 2021-01-07 LAB — PROCALCITONIN: Procalcitonin: 0.23 ng/mL

## 2021-01-07 MED ORDER — FUROSEMIDE 10 MG/ML IJ SOLN
80.0000 mg | Freq: Two times a day (BID) | INTRAMUSCULAR | Status: DC
  Administered 2021-01-07: 18:00:00 80 mg via INTRAVENOUS
  Filled 2021-01-07: qty 8

## 2021-01-07 MED ORDER — FERROUS SULFATE 325 (65 FE) MG PO TABS
325.0000 mg | ORAL_TABLET | ORAL | Status: DC
  Administered 2021-01-07 – 2021-01-09 (×2): 325 mg via ORAL
  Filled 2021-01-07 (×2): qty 1

## 2021-01-07 MED ORDER — FLUTICASONE PROPIONATE 50 MCG/ACT NA SUSP
2.0000 | Freq: Every day | NASAL | Status: DC | PRN
  Filled 2021-01-07: qty 16

## 2021-01-07 MED ORDER — IPRATROPIUM-ALBUTEROL 0.5-2.5 (3) MG/3ML IN SOLN: 3.0000 mL | Freq: Four times a day (QID) | RESPIRATORY_TRACT | Status: DC | PRN

## 2021-01-07 MED ORDER — AMIODARONE HCL 200 MG PO TABS
200.0000 mg | ORAL_TABLET | Freq: Every day | ORAL | Status: DC
  Administered 2021-01-07 – 2021-01-10 (×4): 200 mg via ORAL
  Filled 2021-01-07 (×4): qty 1

## 2021-01-07 MED ORDER — UMECLIDINIUM BROMIDE 62.5 MCG/INH IN AEPB: 1.0000 | INHALATION_SPRAY | Freq: Every day | RESPIRATORY_TRACT | Status: DC

## 2021-01-07 MED ORDER — GERHARDT'S BUTT CREAM
1.0000 "application " | TOPICAL_CREAM | Freq: Two times a day (BID) | CUTANEOUS | Status: DC
  Administered 2021-01-07 – 2021-01-09 (×5): 1 via TOPICAL
  Filled 2021-01-07: qty 1

## 2021-01-07 NOTE — Progress Notes (Addendum)
PROGRESS NOTE   Nicholas Caldwell  PIR:[id number] DOB: 11-Dec-1945 DOA: 01/05/2021 PCP: [name], MD  Brief Narrative:  75 year old community dwelling black male COPD OSA on BiPAP Chronic systolic heart failure 2/2 and ICM + RV failure + severe pulmonary HTN Nonobstructive CAD on cath 12/2018 PEA arrest 08/2019?  Narcotic over use and under use of right  IDDM P A. fib/flutter + DCCV 12/17/2020 Dr. Algernon Huxley (cannot tolerate Tikosyn)-failed amiodarone Patient has been hospitalized 5 times in the last 6 months-he tells me that every time he gains over 3 pounds he has been instructed by cardiology to come to the hospital for IV diuretics  Return [place] 3/6 AM S OB PND +10 pounds weight gain-baseline O2 rec = now 6 L up from 3  -Afebrile tachypneic incomplete RBBB with prolonged QTC CXR = CHF BNP 3522 troponin 33 Started on Lasix IV 80  Hospital-Problem based course  Acute decompensated multifactorial heart failure (severe pulmonary HTN right-sided failure) ATN superimposed on 3B versus cardiorenal syndrome P A. fib CHADS2 score >4 on Eliquis              Defer to cardiology--looks like he will have flutter ablation Bilateral lower extremity pain and weakness that was sudden onset             Tells me he has "no answers" regarding why he became weak suddenly             my exam:slight right foot drop and he also has        duskiness of his second toe             No recent fall although he does have diabetes  Discussed with Dr. Parke Simmers of neurology who saw the patient at the same time as I was seeing him this afternoon She will be ordering various labs to determine if there is any other etiology but with prior strokes in addition to current debility and frequent hospitalizations this is probably multifactorial IDDM A1c 6.8-sliding scale coverage at this time  Sugars ranging 120 eating only about 20% of meals  Resume 70/30 insulin at 10 units twice daily in the next several days if eating  more Dilutional anemia-history hemorrhoids  Iron studies performed = iron deficiency  Nulecit given 3/7 start  Back oral tid FeSO4  Outpatient colonoscopy? acute hypoxic respiratory failure as above OPD OSA Resume DuoNeb every 6 as needed, Incruse Ellipta 1 puff daily Sacral decubitus from prior to admission  Placing Gerhart cream and turn frequently     DVT prophylaxis: Eliquis Code Status: Full Family Communication: Long discussion with wife on 3/6 at bedside Nicholas Caldwell [id number] Disposition:  Status is: Inpatient  Remains inpatient appropriate because:Hemodynamically unstable, Persistent severe electrolyte disturbances, Altered mental status and Ongoing diagnostic testing needed not appropriate for outpatient work up   Dispo: The patient is from: Home              Anticipated d/c is to: Home he has been recommended skilled several times and refuses              Patient currently is not medically stable to d/c.   Difficult to place patient No  Consultants:   Cardiology  Procedures:   Antimicrobials: None   Subjective: Awake coherent alert No fever no chills no chest pain Overall breathing is better but he asked me to bump up his oxygen to 5 L   Objective: Vitals:   01/07/21 0800 01/07/21 0910 01/07/21 1131 01/07/21  1600  BP: (!) 84/41  (!) 109/57 (!) 106/54  Pulse: 70  72 71  Resp: _0 Temp: 98.4 F (36.9 C)  98.4 F (36.9 C) 98.6 F (37 C)  TempSrc: Oral  Oral Oral  SpO2: 93% 94% 90% 94%  Weight:        Intake/Output Summary (Last 24 hours) at 01/07/2021 1657 Last data filed at 01/07/2021 1400 Gross per 24 hour  Intake 960 ml  Output 2200 ml  Net -1240 ml   Filed Weights   01/06/21 0500 01/07/21 0500  Weight: 105.8 kg 104.2 kg    Examination: Awake pleasant slightly disheveled on oxygen-asked me to turn the top little bit No icterus no pallor JVD slight S1-S2 seems to be in A. fib/flutter Abdomen soft no rebound no  guarding Neurologically intact to upper extremities but weak right side >left side   Data Reviewed: personally reviewed   Data White count 5.3 hemoglobin 8.5 platelet 460 BUNs/creatinine 37/2.1-->49/2.1-->47/2.1  procalcitonin 0.17--->and 0.23  Radiology Studies: DG Chest 2 View  Result Date: 01/05/2021 CLINICAL DATA:  Dyspnea, lower extremity edema EXAM: CHEST - 2 VIEW COMPARISON:  12/11/2020 FINDINGS: Pulmonary insufflation is stable the lungs are symmetrically expanded. Moderate left pleural effusion is present. Tiny right pleural effusion is suspected. Diffuse interstitial pulmonary infiltrate is present demonstrating a basilar predominance most in keeping with mild pulmonary edema, likely cardiogenic in nature. Mild to moderate cardiomegaly is unchanged. Central pulmonary arterial enlargement is again noted. No pneumothorax. No acute bone abnormality. IMPRESSION: Mild cardiogenic failure.  Small bilateral pleural effusions. Electronically Signed   By: Fidela Salisbury MD   On: 01/05/2021 23:09     Scheduled Meds: . amiodarone  200 mg Oral Daily  . apixaban  5 mg Oral BID  . atorvastatin  40 mg Oral QHS  . feeding supplement (GLUCERNA SHAKE)  237 mL Oral TID BM  . fluticasone  1 spray Each Nare Daily  . furosemide  80 mg Intravenous BID  . Gerhardt's butt cream  1 application Topical BID  . insulin aspart  0-9 Units Subcutaneous TID WC  . mometasone-formoterol  1 puff Inhalation BID   And  . umeclidinium bromide  1 puff Inhalation Daily  . potassium chloride SA  20 mEq Oral BID  . tamsulosin  0.4 mg Oral QPC breakfast   Continuous Infusions: . sodium chloride       LOS: 1 day   Time spent: [place], MD [facility] To contact the attending provider between 7A-7P or the covering provider during after hours 7P-7A, please log into the web site [email or web address] and access using universal [place] password for that web site. If you do not have the password,  please call the hospital operator.  01/07/2021, 4:57 PM

## 2021-01-07 NOTE — Plan of Care (Signed)
  Problem: Education: Goal: Knowledge of General Education information will improve Description: Including pain rating scale, medication(s)/side effects and non-pharmacologic comfort measures Outcome: Progressing   Problem: Health Behavior/Discharge Planning: Goal: Ability to manage health-related needs will improve Outcome: Progressing   Problem: Clinical Measurements: Goal: Ability to maintain clinical measurements within normal limits will improve Outcome: Progressing Goal: Respiratory complications will improve Outcome: Progressing Goal: Cardiovascular complication will be avoided Outcome: Progressing   Problem: Nutrition: Goal: Adequate nutrition will be maintained Outcome: Progressing   Problem: Pain Managment: Goal: General experience of comfort will improve Outcome: Progressing   Problem: Safety: Goal: Ability to remain free from injury will improve Outcome: Progressing   Problem: Skin Integrity: Goal: Risk for impaired skin integrity will decrease Outcome: Progressing

## 2021-01-07 NOTE — Evaluation (Signed)
Occupational Therapy Evaluation Patient Details Name: Nicholas Caldwell MRN: [id number] DOB: 1946-07-13 Today's Date: 01/07/2021    History of Present Illness Pt is a 75 y/o male admitted 3/5 secondary to worsening LE edema and SOB. Thought to be secondary to CHF exacerbation. PMH includes a flutter, CAD, CHF, COPD, DM, and HTN.   Clinical Impression   Pt primarily transfers with assist of RW and his wife. His wife helps him sponge bathe and dress LEs, with toileting and all IADL. Pt presents with generalized weakness and poor standing balance. He requires set up to total assist for ADL and agreed only to demonstrate his ability to stand this visit. Pt with 5 admissions in 6 months, strongly recommended ST rehab in SNF, but pt adamant he is going home. Will follow acutely and recommend HHOT.   Follow Up Recommendations  Home health OT;Supervision/Assistance - 24 hour (pt refusing SNF)    Equipment Recommendations  None recommended by OT    Recommendations for Other Services       Precautions / Restrictions Precautions Precautions: Fall Restrictions Weight Bearing Restrictions: No      Mobility Bed Mobility Overal bed mobility: Needs Assistance Bed Mobility: Supine to Sit;Sit to Supine     Supine to sit: Mod assist Sit to supine: Mod assist   General bed mobility comments: mod assist for LEs and trunk to EOB, mod assist for LEs back into bed    Transfers Overall transfer level: Needs assistance Equipment used: Rolling walker (2 wheeled) Transfers: Sit to/from Stand Sit to Stand: Min assist;From elevated surface         General transfer comment: Min assist to rise and steady, declined OOB to chair. Heavy reliance on UEs.    Balance Overall balance assessment: Needs assistance   Sitting balance-Leahy Scale: Fair     Standing balance support: Bilateral upper extremity supported Standing balance-Leahy Scale: Poor Standing balance comment: Reliant on BUE support and  external assist.                           ADL either performed or assessed with clinical judgement   ADL Overall ADL's : Needs assistance/impaired Eating/Feeding: Set up;Bed level   Grooming: Wash/dry hands;Wash/dry face;Sitting;Set up   Upper Body Bathing: Minimal assistance;Sitting   Lower Body Bathing: Total assistance;Sit to/from stand   Upper Body Dressing : Minimal assistance;Sitting   Lower Body Dressing: Total assistance;Sit to/from stand   Toilet Transfer: Minimal assistance;Stand-pivot;RW   Toileting- Clothing Manipulation and Hygiene: Maximal assistance               Vision Patient Visual Report: No change from baseline       Perception     Praxis      Pertinent Vitals/Pain Pain Assessment: Faces Faces Pain Scale: Hurts little more Pain Location: R LE Pain Descriptors / Indicators: Aching Pain Intervention(s): Monitored during session;Repositioned     Hand Dominance Right   Extremity/Trunk Assessment Upper Extremity Assessment Upper Extremity Assessment: Generalized weakness (increased muscle atrophy in hands)   Lower Extremity Assessment Lower Extremity Assessment: Defer to PT evaluation   Cervical / Trunk Assessment Cervical / Trunk Assessment: Kyphotic   Communication Communication Communication: HOH   Cognition Arousal/Alertness: Awake/alert Behavior During Therapy: Flat affect Overall Cognitive Status: History of cognitive impairments - at baseline  General Comments: Pt with poor insight, likely baseline.   General Comments       Exercises     Shoulder Instructions      Home Living Family/patient expects to be discharged to:: Private residence Living Arrangements: Spouse/significant other Available Help at Discharge: Family;Available 24 hours/day Type of Home: House Home Access: Stairs to enter [employer] of Steps: 1 Entrance Stairs-Rails: None Home  Layout: One level     Bathroom Shower/Tub: Occupational psychologist: Standard     Home Equipment: Environmental consultant - 2 wheels;Wheelchair - manual;Bedside commode;Grab bars - tub/shower;Shower seat;Electric scooter;Walker - 4 wheels;Other (comment) (adjustable bed)   Additional Comments: on 3.5 L of oxygen at home      Prior Functioning/Environment Level of Independence: Needs assistance  Gait / Transfers Assistance Needed: Has been able to stand and transfer to [facility] or [facility] using RW. ADL's / Homemaking Assistance Needed: Spouse assists with LB ADLs            OT Problem List: Decreased strength;Impaired balance (sitting and/or standing);Decreased activity tolerance;Pain;Decreased cognition;Decreased knowledge of use of DME or AE      OT Treatment/Interventions: Self-care/ADL training;Patient/family education;Therapeutic exercise;Therapeutic activities;Balance training    OT Goals(Current goals can be found in the care plan section) Acute Rehab OT Goals Patient Stated Goal: to feel better OT Goal Formulation: With patient Time For Goal Achievement: 01/21/21 Potential to Achieve Goals: Fair ADL Goals Pt Will Transfer to Toilet: with min guard assist;stand pivot transfer;bedside commode Additional ADL Goal #1: Pt will perform bed mobility with min assist in preparation for ADL.  OT Frequency: Min 2X/week   Barriers to D/C:            Co-evaluation              AM-PAC OT "6 Clicks" Daily Activity     Outcome Measure Help from another person eating meals?: None Help from another person taking care of personal grooming?: A Little Help from another person toileting, which includes using toliet, bedpan, or urinal?: Total Help from another person bathing (including washing, rinsing, drying)?: A Lot Help from another person to put on and taking off regular upper body clothing?: A Little Help from another person to put on and taking off regular lower body clothing?: Total 6  Click Score: 14   End of Session Equipment Utilized During Treatment: Gait belt;Rolling walker Nurse Communication: Mobility status  Activity Tolerance: Patient limited by fatigue Patient left: in bed;with call bell/phone within reach;with bed alarm set;with family/visitor present  OT Visit Diagnosis: Unsteadiness on feet (R26.81);Other abnormalities of gait and mobility (R26.89);Muscle weakness (generalized) (M62.81);Pain                Time: 3754-3606 OT Time Calculation (min): 15 min Charges:  OT General Charges $OT Visit: 1 Visit OT Evaluation $OT Eval Moderate Complexity: 1 Mod  Nestor Lewandowsky, OTR/L Acute Rehabilitation Services Pager: [phone] Office: [phone]  Malka So 01/07/2021, 1:48 PM

## 2021-01-07 NOTE — Consult Note (Addendum)
Advanced Heart Failure Team Consult Note   Primary Physician: [name], MD PCP-Cardiologist:  [name]  Reason for Consultation: Acute on Chronic Combined Systolic and  Diastolic Heart Failure   HPI:    Nicholas Caldwell is seen today for evaluation of acute on chronic combined systolic and diastolic heart failure at the request of Dr. Verlon Au, Internal Medicine.   Patientis a 75 y.o.malewith a history of chronic primarily diastolic CHF with severe RV dysfunction, COPD on 3 L O2, DM, HTN, OHS/OSA on CPAP, paroxysmal atrial flutter/fibrillation s/p DCCV, hyperlipidemia, CAD, and noncompliance.He has multiple admissions due to HF and respiratory failure. Most recent echo in 11/21 showed EF 50-55% with severely enlarged and severely dysfunctional RV, PASP 68 mmHg, moderate-severe TR.   Recently admitted, last month from 2/8-2/16 w/ abdominal pain/nausea/poor po intake for about 2 wks. CXR showed extensive left-sided PNA, and creatinine was up to 3.56 from baseline around 1.8. CT abd/pelvis showed copious colonic stool.  He was also noted to be in rate-controlled atrial flutter (had been in NSR in the recent past).  He was markedly hypokalemic. Eliquis was held with encephalopathy and he was started on heparin gtt.  He was started on vancomycin/cefepime/Flagyl. K repleated. He was started on IV fluid w/ improvement in symptoms. SCr improved and stablized ~2.2. Was eventually restarted back on PO diuretics, torsemide 80 mg daily. Prior to d/c, he underwent successful TEE guided DCCV back to NSR. Of note, LVEF was slightly reduced at 45-50%. RV severely reduced.   Now readmitted for a/c CHF. Presented 3/5 w/ worsening SOB, bilateral LEE and 10 lb wt gain. Hypoxic on arrival w/ increased oxygen demand, requiring 6 L/min (baseline ~3). COVID and Flu negative. BNP elevated at 3,522 (up from 1,858 previous admit). PCT 0.17>>0.23. Hs trop 33>>33. Back in 3:1 Atrial flutter on EKG, HR 70s.  SCr 2.2.  Hgb 9.5>>8.5. CXR w/ moderate left pleural effusion. + mild pulmonary edema   He was started on IV Lasix 60 mg bid. -2L in UOP yesterday. SCr stable at 2.11. Now down to 4L/min [place]. O2 sats ~90%. [place] team asked to further assist.   He is feeling slightly better today. Remains in rate controlled atrial flutter. Amiodarone held on admit due to prolonged QT 543 ms.    TEE 12/2020 LVEF 45-50%, RV severely reduced   2D Echo 09/2020 1. Left ventricular ejection fraction, by estimation, is 50 to 55%. The left ventricle has low normal function. The left ventricle has no regional wall motion abnormalities. Left ventricular diastolic function could not be evaluated. 2. Right ventricular systolic function is severely reduced. The right ventricular size is severely enlarged. There is severely elevated pulmonary artery systolic pressure. The estimated right ventricular systolic pressure is 47.1 mmHg. 3. Right atrial size was severely dilated. 4. The mitral valve was not well visualized. Trivial mitral valve regurgitation. 5. Tricuspid valve regurgitation is moderate to severe. 6. The aortic valve was not well visualized. Aortic valve regurgitation is not visualized. 7. Severely dilated pulmonary artery.  Review of Systems: [y] = yes, _0  = no   . General: Weight gain [ Y]; Weight loss _1 ; Anorexia _2 ; Fatigue _3 ; Fever _4 ; Chills _5 ; Weakness _6   . Cardiac: Chest pain/pressure _7 ; Resting SOB [Y ]; Exertional SOB [Y ]; Orthopnea _8 ; Pedal Edema _9 ; Palpitations _10 ; Syncope _11 ; Presyncope _12 ; Paroxysmal nocturnal dyspnea_13   . Pulmonary: Cough _14 ; Wheezing_15 ;  Hemoptysis_0 ; Sputum _1 ; Snoring _2   . GI: Vomiting_3 ; Dysphagia_4 ; Melena_5 ; Hematochezia _6 ; Heartburn_7 ; Abdominal pain _8 ; Constipation _9 ; Diarrhea _10 ; BRBPR _11   . GU: Hematuria_12 ; Dysuria _13 ; Nocturia_14   . Vascular: Pain in legs with walking _15 ; Pain in feet with lying flat _16 ; Non-healing sores _17 ; Stroke _18 ;  TIA _19 ; Slurred speech _20 ;  . Neuro: Headaches_21 ; Vertigo_22 ; Seizures_23 ; Paresthesias_24 ;Blurred vision _25 ; Diplopia _26 ; Vision changes _27   . Ortho/Skin: Arthritis _28 ; Joint pain _29 ; Muscle pain _30 ; Joint swelling _31 ; Back Pain _32 ; Rash _33   . Psych: Depression_34 ; Anxiety_35   . Heme: Bleeding problems _36 ; Clotting disorders _37 ; Anemia _38   . Endocrine: Diabetes _39 ; Thyroid dysfunction_40   Home Medications Prior to Admission medications   Medication Sig Start Date End Date Taking? Authorizing Provider  acetaminophen (TYLENOL) 325 MG tablet Take 2 tablets (650 mg total) by mouth every 4 (four) hours as needed for headache or mild pain. 10/09/20  Yes Vann, Jessica U, DO  albuterol (VENTOLIN HFA) 108 (90 Base) MCG/ACT inhaler Inhale 2 puffs into the lungs every 6 (six) hours as needed for wheezing or shortness of breath.   Yes [provider]  amiodarone (PACERONE) 200 MG tablet Take 1 tablet twice daily for next 2 weeks followed by 1 tablet daily. Patient taking differently: Take 200 mg by mouth daily. 12/19/20  Yes Pahwani, Einar Grad, MD  apixaban (ELIQUIS) 5 MG TABS tablet Take 1 tablet (5 mg total) by mouth 2 (two) times daily. 12/19/20 01/18/21 Yes Pahwani, Einar Grad, MD  atorvastatin (LIPITOR) 40 MG tablet Take 1 tablet (40 mg total) by mouth daily. Patient taking differently: Take 40 mg by mouth at bedtime. 10/30/20  Yes Larey Dresser, MD  Budeson-Glycopyrrol-Formoterol (BREZTRI AEROSPHERE) 160-9-4.8 MCG/ACT AERO Inhale 2 puffs into the lungs 2 (two) times daily. 06/21/20  Yes Tanda Rockers, MD  carbamide peroxide (DEBROX) 6.5 % OTIC solution Place 5-10 drops into both ears 3 (three) times daily as needed (ear wax).   Yes [provider]  Carboxymethylcellulose Sod PF 0.25 % SOLN Place 1 drop into both eyes 2 (two) times daily as needed (dry eyes).   Yes [provider]  cetirizine (ZYRTEC) 10 MG tablet Take 10 mg by mouth at bedtime.   Yes [provider]  Cholecalciferol (VITAMIN D) 50 MCG (2000 UT) CAPS Take 2,000 Units by mouth daily.   Yes [provider]  diazepam (VALIUM) 10 MG tablet Take 10 mg by mouth 3 (three) times daily as needed for seizure. 11/22/20  Yes [provider]  famotidine (PEPCID) 20 MG tablet TAKE 1 TABLET(20 MG) BY MOUTH TWICE DAILY Patient taking differently: Take 20 mg by mouth 2 (two) times daily as needed for indigestion. 10/29/20  Yes Larey Dresser, MD  feeding supplement, GLUCERNA SHAKE, (GLUCERNA SHAKE) LIQD Take 237 mLs by mouth 3 (three) times daily between meals. 11/27/20  Yes Swayze, Ava, DO  ferrous sulfate 325 (65 FE) MG tablet Take 325 mg by mouth every Monday, Wednesday, and Friday at 6 PM.   Yes [provider]  fluticasone (FLONASE) 50 MCG/ACT nasal spray Place 2 sprays into both nostrils daily as needed for allergies.   Yes [provider]  hydrocortisone (ANUSOL-HC) 2.5 % rectal cream Place 1 application rectally 2 (two)  times daily as needed for hemorrhoids or itching.    Yes [provider]  hydrocortisone 1 % lotion Apply 1 application topically 2 (two) times daily. Back   Yes [provider]  insulin aspart (NOVOLOG) 100 UNIT/ML injection Inject 0-15 Units into the skin 3 (three) times daily with meals. Patient taking differently: Inject 4 Units into the skin 3 (three) times daily with meals. 10/09/20  Yes Vann, Jessica U, DO  insulin aspart protamine- aspart (NOVOLOG MIX 70/30) (70-30) 100 UNIT/ML injection Inject 10 Units into the skin 2 (two) times daily with a meal.   Yes [provider]  Insulin Syringe 27G X 1/2" 0.5 ML MISC 100 Syringes by Does not apply route 3 (three) times daily. 08/29/19  Yes Hosie Poisson, MD  ipratropium-albuterol (DUONEB) 0.5-2.5 (3) MG/3ML SOLN Take 3 mLs by nebulization 4 (four) times daily as needed.   Yes [provider]  latanoprost (XALATAN) 0.005 % ophthalmic solution Place 1 drop  into both eyes at bedtime.   Yes [provider]  oxybutynin (DITROPAN) 5 MG tablet Take 1 tablet by mouth 4 (four) times daily as needed for incontinence. 07/03/20  Yes [provider]  oxyCODONE-acetaminophen (PERCOCET) 10-325 MG tablet Take 1 tablet by mouth every 6 (six) hours as needed for pain.   Yes [provider]  OXYGEN Inhale 3 L/min into the lungs continuous.   Yes [provider]  pantoprazole (PROTONIX) 40 MG tablet Take 40 mg by mouth 2 (two) times daily. 07/03/20  Yes [provider]  polyethylene glycol powder (GLYCOLAX/MIRALAX) 17 GM/SCOOP powder Take 17 g by mouth daily as needed for constipation. 07/03/20  Yes [provider]  potassium chloride SA (KLOR-CON) 20 MEQ tablet Take 1 tablet (20 mEq total) by mouth 2 (two) times daily. Patient taking differently: Take 40 mEq by mouth 2 (two) times daily. 11/09/20 12/09/20 Yes [place], Maricela Bo, FNP  senna-docusate (SENOKOT-S) 8.6-50 MG tablet Take 1 tablet by mouth at bedtime as needed for mild constipation. 07/03/20  Yes [provider]  silver sulfADIAZINE (SILVADENE) 1 % cream Apply topically daily. Patient taking differently: Apply 1 application topically daily. 11/01/20  Yes Kayleen Memos, DO  sodium chloride (OCEAN) 0.65 % SOLN nasal spray Place 2 sprays into both nostrils as needed for congestion.    Yes [provider]  tamsulosin (FLOMAX) 0.4 MG CAPS capsule Take 1 capsule (0.4 mg total) by mouth daily. Patient taking differently: Take 0.4 mg by mouth daily after breakfast. 10/09/20  Yes Vann, Jessica U, DO  terbinafine (LAMISIL) 1 % cream Apply 1 application topically daily. 03/10/20  Yes [provider]  torsemide 40 MG TABS Take 80 mg by mouth daily. 12/19/20 01/18/21 Yes Pahwani, Einar Grad, MD  lidocaine (LIDODERM) 5 % Place 1 patch onto the skin daily. Remove & Discard patch within 12 hours or as directed by MD Patient not taking: No sig reported 10/10/20    Geradine Girt, DO  umeclidinium bromide (INCRUSE ELLIPTA) 62.5 MCG/INH AEPB Inhale 1 puff into the lungs daily. Patient not taking: No sig reported 11/28/20   Swayze, Ava, DO    Past Medical History: Past Medical History:  Diagnosis Date  . Atrial flutter ([place])    a. recurrent AFlutter with RVR;  b. Amiodarone Rx started 4/16  . CAD (coronary artery disease)    a. LHC 1/16:  mLAD diffuse disease, pLCx mild disease, dLCx with disease but too small for PCI, RCA ok, EF 25-30%  .  Chronic pain   . Chronic systolic CHF (congestive heart failure) ([place])   . COPD (chronic obstructive pulmonary disease) ([place])   . Diabetes mellitus without complication ([place])   . Ex-smoker 11/2017  . Hypercholesteremia   . Hypertension   . NICM (nonischemic cardiomyopathy) ([place])    a.dx 2016. b. 2D echo 06/2016 - Last echo 07/01/16: mod dilated LV, mod LVH, EF 25-30%, mild-mod MR, sev LAE, mild-mod reduced RV systolic function, mild-mod TR, PASP 29mHG.  .Marland KitchenPAF (paroxysmal atrial fibrillation) ([facility])    On amio - ot a candidate for flecainide due to cardiomyopathy, not a candidate for Tikosyn due to prolonged QT, and felt to be a poor candidate for ablation given left atrial size.  . Pulmonary hypertension ([place]   . Tobacco abuse     Past Surgical History: Past Surgical History:  Procedure Laterality Date  . BIOPSY  03/11/2019   Procedure: BIOPSY;  Surgeon: CLavena Bullion DO;  [place]  Service: Gastroenterology;;  . BIOPSY  03/15/2019   Procedure: BIOPSY;  Surgeon: MIrving Copas, MD;  [place]  Service: Gastroenterology;;  . CARDIOVERSION N/A 07/30/2018   Procedure: CARDIOVERSION;  Surgeon: MLarey Dresser MD;  [facility];  Service: Cardiovascular;  Laterality: N/A;  . CARDIOVERSION N/A 12/17/2020   Procedure: CARDIOVERSION;  Surgeon: MLarey Dresser MD;  [facility];  Service: Cardiovascular;  Laterality: N/A;  . COLONOSCOPY N/A 03/11/2019    Procedure: COLONOSCOPY;  Surgeon: CLavena Bullion DO;  [place]  Service: Gastroenterology;  Laterality: N/A;  . ENTEROSCOPY N/A 03/15/2019   Procedure: ENTEROSCOPY;  Surgeon: MRush LandmarkGTelford Nab, MD;  [place]  Service: Gastroenterology;  Laterality: N/A;  . ESOPHAGOGASTRODUODENOSCOPY Left 03/11/2019   Procedure: ESOPHAGOGASTRODUODENOSCOPY (EGD);  Surgeon: CLavena Bullion DO;  [facility];  Service: Gastroenterology;  Laterality: Left;  . LEFT AND RIGHT HEART CATHETERIZATION WITH CORONARY ANGIOGRAM N/A 11/06/2014   Procedure: LEFT AND RIGHT HEART CATHETERIZATION WITH CORONARY ANGIOGRAM;  Surgeon: JJettie Booze MD;  [facility];  Service: Cardiovascular;  Laterality: N/A;  . RIGHT/LEFT HEART CATH AND CORONARY ANGIOGRAPHY N/A 12/06/2018   Procedure: RIGHT/LEFT HEART CATH AND CORONARY ANGIOGRAPHY;  Surgeon: MLarey Dresser MD;  [facility];  Service: Cardiovascular;  Laterality: N/A;  . SPLENECTOMY    . SUBMUCOSAL TATTOO INJECTION  03/15/2019   Procedure: SUBMUCOSAL TATTOO INJECTION;  Surgeon: MIrving Copas, MD;  [place]  Service: Gastroenterology;;  . TEE WITHOUT CARDIOVERSION N/A 12/17/2020   Procedure: TRANSESOPHAGEAL ECHOCARDIOGRAM (TEE);  Surgeon: MLarey Dresser MD;  [facility];  Service: Cardiovascular;  Laterality: N/A;    Family History: Family History  Problem Relation Age of Onset  . Heart disease Mother   . Hypertension Mother   . Heart failure Mother   . Heart disease Father   . Hypertension Sister   . Heart attack Neg Hx   . Stroke Neg Hx   . Colon cancer Neg Hx   . Esophageal cancer Neg Hx     Social History: Social History   Socioeconomic History  . Marital status: Married    Spouse name: Not on file  . Number of children: Not on file  . Years of education: Not on file  . Highest education level: Not on file  Occupational History  . Not on file   Tobacco Use  . Smoking status: Former Smoker    Packs/day: 1.00    Years: 34.00  Pack years: 34.00    Types: Cigarettes    Quit date: 11/30/2018    Years since quitting: 2.1  . Smokeless tobacco: Never Used  Vaping Use  . Vaping Use: Never used  Substance and Sexual Activity  . Alcohol use: No    Alcohol/week: 0.0 standard drinks  . Drug use: No  . Sexual activity: Not on file  Other Topics Concern  . Not on file  Social History Narrative   ** Merged History Encounter **       Social Determinants of Health   Financial Resource Strain: Not on file  Food Insecurity: Not on file  Transportation Needs: Not on file  Physical Activity: Not on file  Stress: Not on file  Social Connections: Not on file    Allergies:  Allergies  Allergen Reactions  . Isosorb Dinitrate-Hydralazine Shortness Of Breath  . Benazepril Nausea And Vomiting  . Brimonidine Other (See Comments)    Dry eyes  . Metformin Other (See Comments)    Cramps and abdominal pain  . Morphine Rash    Objective:    Vital Signs:   Temp:  [97.8 F (36.6 C)-98.5 F (36.9 C)] 98.4 F (36.9 C) (03/07 0800) Pulse Rate:  [70-75] 70 (03/07 0800) Resp:  [16-24] 19 (03/07 0800) BP: (84-109)/(41-73) 84/41 (03/07 0800) SpO2:  [92 %-95 %] 94 % (03/07 0910) Weight:  [104.2 kg] 104.2 kg (03/07 0500) Last BM Date: 01/07/21  Weight change: Filed Weights   01/06/21 0500 01/07/21 0500  Weight: 105.8 kg 104.2 kg    Intake/Output:   Intake/Output Summary (Last 24 hours) at 01/07/2021 1050 Last data filed at 01/07/2021 0825 Gross per 24 hour  Intake 960 ml  Output 2000 ml  Net -1040 ml      Physical Exam    General:  Chronically ill appearing. No resp difficulty HEENT: normal Neck: supple. JVP elevated to jaw . Carotids 2+ bilat; no bruits. No lymphadenopathy or thyromegaly appreciated. Cor: PMI nondisplaced. Irregular rhythm. Regular rate No rubs, gallops or murmurs. Lungs: diffuse bilateral expiratory  rhonchi and wheezing  Abdomen: soft, nontender, nondistended. No hepatosplenomegaly. No bruits or masses. Good bowel sounds. Extremities: no cyanosis, clubbing, rash, trace bilateral ankle edema Neuro: alert & orientedx3, cranial nerves grossly intact. moves all 4 extremities w/o difficulty. Affect pleasant   Telemetry   Atrial Flutter 70s   EKG    3:1 Atrial Flutter 71 bpm   Labs   Basic Metabolic Panel: Recent Labs  Lab 01/05/21 2213 01/06/21 0437 01/07/21 0057  NA 143  --  141  K 3.5  --  3.7  CL 103  --  100  CO2 28  --  30  GLUCOSE 78  --  130*  BUN 49*  --  47*  CREATININE 2.16*  --  2.11*  CALCIUM 8.1*  --  8.3*  MG  --  2.0  --     Liver Function Tests: No results for input(s): AST, ALT, ALKPHOS, BILITOT, PROT, ALBUMIN in the last 168 hours. No results for input(s): LIPASE, AMYLASE in the last 168 hours. No results for input(s): AMMONIA in the last 168 hours.  CBC: Recent Labs  Lab 01/05/21 2213 01/06/21 0438 01/07/21 0057  WBC 4.5 5.2 5.3  HGB 9.5* 8.2* 8.5*  HCT 32.5* 27.6* 29.1*  MCV 87.1 87.3 86.6  PLT 515* 478* 460*    Cardiac Enzymes: No results for input(s): CKTOTAL, CKMB, CKMBINDEX, TROPONINI in the last 168 hours.  BNP: BNP (last 3  results) Recent Labs    11/21/20 1944 12/11/20 0730 01/05/21 2213  BNP 2,475.4* 1,858.3* 3,522.2*    ProBNP (last 3 results) No results for input(s): PROBNP in the last 8760 hours.   CBG: Recent Labs  Lab 01/06/21 0610 01/06/21 1039 01/06/21 1606 01/06/21 2107 01/07/21 0549  GLUCAP 82 126* 115* 102* 105*    Coagulation Studies: No results for input(s): LABPROT, INR in the last 72 hours.   Imaging    No results found.   Medications:     Current Medications: . apixaban  5 mg Oral BID  . atorvastatin  40 mg Oral QHS  . feeding supplement (GLUCERNA SHAKE)  237 mL Oral TID BM  . fluticasone  1 spray Each Nare Daily  . furosemide  60 mg Intravenous Q12H  . insulin aspart  0-9 Units  Subcutaneous TID WC  . mometasone-formoterol  1 puff Inhalation BID   And  . umeclidinium bromide  1 puff Inhalation Daily  . potassium chloride SA  20 mEq Oral BID  . tamsulosin  0.4 mg Oral QPC breakfast     Infusions: . sodium chloride        Assessment/Plan   1. Acute on Chronic Combined Systolic + Diastolic Heart Failure w/ Prominent RV Failure: Likely related to COPD/OSA/OHS.H/o poor compliance w/ home O2 and BiPAP.Echo in 11/21 withEF 50-55%, RV severely reduced w/ severely elevated RV systolic pressure, 68 mmHg.TEE 12/2020 LVEF mildly reduced 45-50%. RV severely reduced. Admitted for ADHF. BNP higher from prior baseline, 3,522. CXR w/ moderate left pleural effusion + mild pulmonary edema - Diurese w/ IV Lasix 80 mg bid - Suspect recurrent AFL contributing. Plan repeat attempt at [place] after diuresis  2. Atrial Fibrillation/ Flutter: Paroxysmal. He has history of CVA. He had DCCV in 3/20 and again in 5/20. He failed amiodarone in the past and cannot take Tikosyn due to baseline prolonged QT interval. Underwent recent TEE/DCCV for AFL 12/17/20. Now back in Atrial flutter, rate controlled in the 70s. PO amiodarone held on admit due to pronged QTc 543 ms  - Continue Eliquis 5 mg bid  - Agree w/ holding Amiodarone w/ prolonged QT - reports full compliance w/ Eliquis. Plan repeat attempt at DCCV after diuresis  - ? AFL ablation  - needs to improve compliance w/ home BiPAP  3. ChronicHypoxic/Hypercarbic Respiratory failure: Due toADCHF + COPD/OSA/OHS w/ poor compliance w/ home O2 and BiPAP. 4. Stage III CKD: new baseline ~2.2 - stable at 2.1 today. Follow w/ diuresis  5. COPD:No longer smokes. Continue homeoxygen.  - lung exam w/ diffuse bilateral rhonchi and wheezing, deferring steroids to IM  6.OHS/OSA:Continue oxygen during the day andBIPAP at night.  7.CAD:Nonobstructive disease in 2/20. No chest pain.  - no ASA due to anticoagulation. -Atorvastatin.  Length  of Stay: [place], PA-C  01/07/2021, 10:50 AM  Advanced Heart Failure Team Pager [phone] (M-F; 7a - 4p)  Please contact [facility] for night-coverage after hours (4p -7a ) and weekends on [email or web address]  Patient seen with PA, agree with the above note.   Patient was admitted about a month ago with PNA, atrial fibrillation, AKI.  He was cardioverted and sent home on torsemide 80 mg po daily (had been on bid prior to admission, decreased with AKI and looked euvolemic).  He did not have a followup apparently.  Re-admitted with increasing dyspnea and found to have CHF exacerbation with pulmonary edema on CXR.  He is back in atrial flutter.   General:  NAD Neck: JVP 14+ cm, no thyromegaly or thyroid nodule.  Lungs: Clear to auscultation bilaterally with normal respiratory effort. CV: Nondisplaced PMI.  Heart irregular S1/S2, no S3/S4, no murmur.  1+ edema 1/2 to knees bilaterally. Abdomen: Soft, nontender, no hepatosplenomegaly, no distention.  Skin: Intact without lesions or rashes.  Neurologic: Alert and oriented x 3.  Psych: Normal affect. Extremities: No clubbing or cyanosis.  HEENT: Normal.   Acute on chronic HF with mid range EF and prominent RV dysfunction in setting of OHS/OSA and COPD.  Creatinine 2.1, which is near his baseline.  - Diurese with Lasix 80 mg IV bid for now.  He will likely need to go back on torsemide 80 mg po bid when he goes home.   Back in flutter, has not missed Eliquis doses since last DCCV.  Likely recurrent flutter triggered by volume overload.   - Once he is diuresed, can DCCV again.  - I think he can restart amiodarone 200 daily.  His QTc reading on ECG is not accurate given atrial flutter (identifying flutter wave as T wave, I think).   Poor compliance with outpatient followup and refusal of rehab makes management difficult.   Loralie Champagne 01/07/2021 11:53 AM

## 2021-01-07 NOTE — Consult Note (Signed)
Neurology Consultation  Reason for Consult: Leg weakness Referring Physician: Dr. Verlon Au  CC: Bilateral leg weakness  History is obtained from: Patient, chart review  HPI: Nicholas Caldwell is a 75 y.o. male with a medical history significant for acute on chronic combined systolic and diastolic heart failure, severe right ventricular dysfunction, chronic obstructive pulmonary disease on 3L home oxygen, chronic kidney disease stage IIIB, insulin-dependent diabetes mellitus c/b neuropathy, hypertension, obstructive sleep apnea on CPAP, paroxysmal atrial fibrillation s/p DCCV, hyperlipidemia, coronary artery disease, and medical noncompliance who presented to the ED 3/6 for increasing shortness of breath, weight gain, and lower extremity edema. On admission, he was noted to have a subtle right foot drop and bilateral lower extremity weakness that has been persistent for approximately 8 months or 3-4 months (variable history provided to NP and attending) affecting patient ambulation and neurology was consulted for further evaluation.   At baseline patient states he is largely bed-bound but he is able to transfer from the bed to a chair with the use of a walker. He states he has not been able to walk for approximately 8 months. He states he was ambulating without difficulty and he woke up one morning with leg weakness and inability to ambulate. He denies any injury or pain contributing to ambulation limitation. He does not feel his symptoms have worsened since onset. He reports compliance to apixaban 5 mg BID (10 AM and 10 PM, never misses doses)  ROS: A 14 point ROS was performed and is negative except as noted in the HPI.   Past Medical History:  Diagnosis Date  . Atrial flutter ([place])    a. recurrent AFlutter with RVR;  b. Amiodarone Rx started 4/16  . CAD (coronary artery disease)    a. LHC 1/16:  mLAD diffuse disease, pLCx mild disease, dLCx with disease but too small for PCI, RCA ok, EF 25-30%  .  Chronic pain   . Chronic systolic CHF (congestive heart failure) ([place])   . COPD (chronic obstructive pulmonary disease) ([place])   . Diabetes mellitus without complication ([place])   . Ex-smoker 11/2017  . Hypercholesteremia   . Hypertension   . NICM (nonischemic cardiomyopathy) ([place])    a.dx 2016. b. 2D echo 06/2016 - Last echo 07/01/16: mod dilated LV, mod LVH, EF 25-30%, mild-mod MR, sev LAE, mild-mod reduced RV systolic function, mild-mod TR, PASP 92mHG.  .Marland KitchenPAF (paroxysmal atrial fibrillation) (HCC)    On amio - ot a candidate for flecainide due to cardiomyopathy, not a candidate for Tikosyn due to prolonged QT, and felt to be a poor candidate for ablation given left atrial size.  . Pulmonary hypertension ([place]   . Tobacco abuse    Past Surgical History:  Procedure Laterality Date  . BIOPSY  03/11/2019   Procedure: BIOPSY;  Surgeon: CLavena Bullion DO;  [place]  Service: Gastroenterology;;  . BIOPSY  03/15/2019   Procedure: BIOPSY;  Surgeon: MIrving Copas, MD;  [place]  Service: Gastroenterology;;  . CARDIOVERSION N/A 07/30/2018   Procedure: CARDIOVERSION;  Surgeon: MLarey Dresser MD;  [facility]  Service: Cardiovascular;  Laterality: N/A;  . CARDIOVERSION N/A 12/17/2020   Procedure: CARDIOVERSION;  Surgeon: MLarey Dresser MD;  [facility];  Service: Cardiovascular;  Laterality: N/A;  . COLONOSCOPY N/A 03/11/2019   Procedure: COLONOSCOPY;  Surgeon: CLavena Bullion DO;  [place]  Service: Gastroenterology;  Laterality: N/A;  . ENTEROSCOPY N/A 03/15/2019   Procedure: ENTEROSCOPY;  Surgeon:  Mansouraty, Telford Nab., MD;  Location: [place];  Service: Gastroenterology;  Laterality: N/A;  . ESOPHAGOGASTRODUODENOSCOPY Left 03/11/2019   Procedure: ESOPHAGOGASTRODUODENOSCOPY (EGD);  Surgeon: Lavena Bullion, DO;  [facility] END[facility]COPY;  Service: Gastroenterology;  Laterality: Left;  . LEFT AND RIGHT HEART  CATHETERIZATION WITH CORONARY ANGIOGRAM N/A 11/06/2014   Procedure: LEFT AND RIGHT HEART CATHETERIZATION WITH CORONARY ANGIOGRAM;  Surgeon: Jettie Booze, MD;  Location[facility] C[facility] LAB;  Service: Cardiovascular;  Laterality: N/A;  . RIGHT/LEFT HEART CATH AND CORONARY ANGIOGRAPHY N/A 12/06/2018   Procedure: RIGHT/LEFT HEART CATH AND CORONARY ANGIOGRAPHY;  Surgeon: Larey Dresser, MD;  Location: [place] CV LAB;  Service: Cardiovascular;  Laterality: N/A;  . SPLENECTOMY    . SUBMUCOSAL TATTOO INJECTION  03/15/2019   Procedure: SUBMUCOSAL TATTOO INJECTION;  Surgeon: Irving Copas., MD;  Location: [place];  Service: Gastroenterology;;  . TEE WITHOUT CARDIOVERSION N/A 12/17/2020   Procedure: TRANSESOPHAGEAL ECHOCARDIOGRAM (TEE);  Surgeon: Larey Dresser, MD;  Location: [facility] ENDOSCOPY;  Service: Cardiovascular;  Laterality: N/A;   Family History  Problem Relation Age of Onset  . Heart disease Mother   . Hypertension Mother   . Heart failure Mother   . Heart disease Father   . Hypertension Sister   . Heart attack Neg Hx   . Stroke Neg Hx   . Colon cancer Neg Hx   . Esophageal cancer Neg Hx    Social History:   reports that he quit smoking about 2 years ago. His smoking use included cigarettes. He has a 34.00 pack-year smoking history. He has never used smokeless tobacco. He reports that he does not drink alcohol and does not use drugs. Medications  Current Facility-Administered Medications:  .  0.9 %  sodium chloride infusion, 250 mL, Intravenous, PRN, Opyd, Ilene Qua, MD .  acetaminophen (TYLENOL) tablet 650 mg, 650 mg, Oral, Q4H PRN, Opyd, Timothy S, MD .  amiodarone (PACERONE) tablet 200 mg, 200 mg, Oral, Daily, Larey Dresser, MD .  apixaban [facility]) tablet 5 mg, 5 mg, Oral, BID, Opyd, Ilene Qua, MD, 5 mg at 01/07/21 0959 .  atorvastatin (LIPITOR) tablet 40 mg, 40 mg, Oral, QHS, Opyd, Ilene Qua, MD, 40 mg at 01/06/21 2148 .  famotidine (PEPCID) tablet 20 mg, 20 mg,  Oral, BID PRN, Opyd, Ilene Qua, MD, 20 mg at 01/06/21 0948 .  feeding supplement (GLUCERNA SHAKE) (GLUCERNA SHAKE) liquid 237 mL, 237 mL, Oral, TID BM, Opyd, Ilene Qua, MD, 237 mL at 01/07/21 1007 .  fluticasone (FLONASE) 50 MCG/ACT nasal spray 1 spray, 1 spray, Each Nare, Daily, Nita Sells, MD, 1 spray at 01/06/21 1232 .  furosemide (LASIX) injection 80 mg, 80 mg, Intravenous, BID, [name], Dalton S, MD .  insulin aspart (novoLOG) injection 0-9 Units, 0-9 Units, Subcutaneous, TID WC, Opyd, Ilene Qua, MD, 1 Units at 01/06/21 1214 .  LORazepam (ATIVAN) injection 1 mg, 1 mg, Intravenous, Q6H PRN, Opyd, Ilene Qua, MD .  mometasone-formoterol (DULERA) 100-5 MCG/ACT inhaler 1 puff, 1 puff, Inhalation, BID, 1 puff at 01/07/21 0910 **AND** umeclidinium bromide (INCRUSE ELLIPTA) 62.5 MCG/INH 1 puff, 1 puff, Inhalation, Daily, Opyd, Ilene Qua, MD, 1 puff at 01/07/21 0910 .  oxybutynin (DITROPAN) tablet 5 mg, 5 mg, Oral, QID PRN, Opyd, Ilene Qua, MD .  oxyCODONE-acetaminophen (PERCOCET/ROXICET) 5-325 MG per tablet 1 tablet, 1 tablet, Oral, Q6H PRN, 1 tablet at 01/07/21 1232 **AND** oxyCODONE (Oxy IR/ROXICODONE) immediate release tablet 5 mg, 5 mg, Oral, Q6H PRN, Opyd, Ilene Qua, MD, 5  mg at 01/07/21 1232 .  potassium chloride SA (KLOR-CON) CR tablet 20 mEq, 20 mEq, Oral, BID, Opyd, Ilene Qua, MD, 20 mEq at 01/07/21 0959 .  tamsulosin (FLOMAX) capsule 0.4 mg, 0.4 mg, Oral, QPC breakfast, Opyd, Ilene Qua, MD, 0.4 mg at 01/07/21 4010  Exam: Current vital signs: BP (!) 109/57 (BP Location: Left Arm)   Pulse 72   Temp 98.4 F (36.9 C) (Oral)   Resp 19   Wt 104.2 kg   SpO2 90%   BMI 31.16 kg/m  Vital signs in last 24 hours: Temp:  [97.8 F (36.6 C)-98.5 F (36.9 C)] 98.4 F (36.9 C) (03/07 1131) Pulse Rate:  [70-74] 72 (03/07 1131) Resp:  [17-24] 19 (03/07 1131) BP: (84-109)/(41-57) 109/57 (03/07 1131) SpO2:  [90 %-95 %] 90 % (03/07 1131) Weight:  [104.2 kg] 104.2 kg (03/07 0500)  GENERAL:  Awake, laying in bed watching television with wife at bedside, no acute distress Psych: Tearful when speaking about his loss of independent ambulation, cooperative with examination HEENT: - Normocephalic and atraumatic, without obvious abnormality LUNGS - Normal respiratory effort, SpO2 89% on nasal cannula CV - regular rate on cardiac monitor, 2+ edema of lower extremities ABDOMEN - Soft, nontender Ext: Lower extremity edema.  Necrotic second toe on the right foot  NEURO:  Mental Status: alert and oriented to person, place, time, and situation. Patient is able to give a clear and coherent history of present illness but lacks precise timelines. Speech is without aphasia but he is mildly dysarthric at baseline. Naming, fluency, and comprehension intact. Cranial Nerves:  II: PERRL 3 mm/brisk. Visual fields full.  III, IV, VI: EOMI without ptosis V: Sensation is intact to light touch and symmetrical to face.  VII: Face is symmetric resting and smiling.  VIII: Hearing intact to voice IX, X: Palate elevation is symmetric. Phonation normal.  XI: Normal sternocleidomastoid and trapezius muscle strength XII: Tongue protrudes midline without fasciculations.   Motor: right hip 3/5 strength without ability to overcome gravity, immediate drift to bed, at the hip and stronger distally. Left lower hip 4/5 strength and drift on examination. Tone is increased at bilateral hips and knees. Ankle flexion/extension 4-/5 bilaterally. Bulk is normal.  Sensation: Decreased sensation to light touch present distally in lower extremities with increased perception proximally. Sensation to cool temperature absent distally, intact at the knee level and increases proximally.  He has absent vibration sensation at the toes and decreased proprioception as well Coordination: FTN intact bilaterally. HKS not assessed due to patient weakness of lower extremities. DTRs: 1+ in the bilateral patellas, absent at the ankles Gait-  deferred  Labs I have reviewed labs in epic and the results pertinent to this consultation are: CBC    Component Value Date/Time   WBC 5.3 01/07/2021 0057   RBC 3.36 (L) 01/07/2021 0057   HGB 8.5 (L) 01/07/2021 0057   HCT 29.1 (L) 01/07/2021 0057   PLT 460 (H) 01/07/2021 0057   MCV 86.6 01/07/2021 0057   MCH 25.3 (L) 01/07/2021 0057   MCHC 29.2 (L) 01/07/2021 0057   RDW 19.6 (H) 01/07/2021 0057   LYMPHSABS 0.7 12/19/2020 0158   MONOABS 1.6 (H) 12/19/2020 0158   EOSABS 0.2 12/19/2020 0158   BASOSABS 0.0 12/19/2020 0158    CMP     Component Value Date/Time   NA 141 01/07/2021 0057   K 3.7 01/07/2021 0057   CL 100 01/07/2021 0057   CO2 30 01/07/2021 0057   GLUCOSE 130 (H) 01/07/2021  0057   BUN 47 (H) 01/07/2021 0057   CREATININE 2.11 (H) 01/07/2021 0057   CREATININE 0.76 03/24/2016 1656   CALCIUM 8.3 (L) 01/07/2021 0057   PROT 7.0 12/11/2020 0730   ALBUMIN 2.7 (L) 12/11/2020 0730   AST 39 12/11/2020 0730   ALT 22 12/11/2020 0730   ALKPHOS 124 12/11/2020 0730   BILITOT 1.1 12/11/2020 0730   GFRNONAA 33 (L) 01/07/2021 0057   GFRAA 18 (L) 07/25/2020 1553    Lipid Panel     Component Value Date/Time   CHOL 113 08/28/2020 1622   TRIG 121 08/28/2020 1622   HDL 38 (L) 08/28/2020 1622   CHOLHDL 3.0 08/28/2020 1622   VLDL 24 08/28/2020 1622   LDLCALC 51 08/28/2020 1622   Lab Results  Component Value Date   HGBA1C 6.8 (H) 12/11/2020    Lab Results  Component Value Date   VITAMINB12 396 08/27/2019    Lab Results  Component Value Date   TSH 1.018 12/11/2020     MRI brain Mar 20 2019:     Assessment: 75 year old male with extensive medical history who was admitted to the hospital 3/6 for evaluation and management of ongoing heart failure. He states he has had bilateral lower extremity weakness affecting his ability to ambulate for approximately 8 months. Primary team noted right leg weakness.  On our examination this is virtually right hip weakness. - Expect  weakness is multifactorial with diabetic polyneuropathy, deconditioning, and complications from heart failure, potentially new stroke several months ago and/or recrudescence of old strokes in the setting of ADHF, there may be some component of degenerative disc disease contributing as well.   - Examination reveals right lower extremity weakness greater than left lower extremity and right foot sensation decreased more than left foot sensation. Also of note is increased tone with hip and knee flexion bilaterally.  At this time would not pursue MRI of the spine given the chronicity of the symptoms and the patient's overall stability.  Impression: -Diabetic neuropathy -Deconditioning -Prior strokes -Weakness secondary to hypoxia secondary to COPD/acute decompensated heart failure  Recommendations: -Assess for additional contributors to neuropathy B12/MMA, HIV, SPEP, UPEP, IFE;                      B12 goal greater than 400, supplement as needed -The results of these studies can be followed up by an outpatient neurologist to be arranged through [facility] system. - Please contact neurology if new neurological questions arise  Pt seen by NP/Neuro and later by MD. Note/plan to be edited by MD as needed.  Anibal Henderson, AGAC-NP [facility] Pager: [phone]  Attending Neurologist's note:  I personally saw this patient, gathering history, performing a full neurologic examination, reviewing relevant labs, personally reviewing relevant imaging including prior MRI brain and formulated the assessment and plan, adding the note above for completeness and clarity to accurately reflect my thoughts

## 2021-01-08 DIAGNOSIS — I483 Typical atrial flutter: Secondary | ICD-10-CM

## 2021-01-08 DIAGNOSIS — I4819 Other persistent atrial fibrillation: Secondary | ICD-10-CM

## 2021-01-08 DIAGNOSIS — I5043 Acute on chronic combined systolic (congestive) and diastolic (congestive) heart failure: Secondary | ICD-10-CM | POA: Diagnosis not present

## 2021-01-08 LAB — BASIC METABOLIC PANEL
Anion gap: 9 (ref 5–15)
BUN: 41 mg/dL — ABNORMAL HIGH (ref 8–23)
CO2: 33 mmol/L — ABNORMAL HIGH (ref 22–32)
Calcium: 8.6 mg/dL — ABNORMAL LOW (ref 8.9–10.3)
Chloride: 98 mmol/L (ref 98–111)
Creatinine, Ser: 1.92 mg/dL — ABNORMAL HIGH (ref 0.61–1.24)
GFR, Estimated: 37 mL/min — ABNORMAL LOW (ref >60)
Glucose, Bld: 106 mg/dL — ABNORMAL HIGH (ref 70–99)
Potassium: 3.8 mmol/L (ref 3.5–5.1)
Sodium: 140 mmol/L (ref 135–145)

## 2021-01-08 LAB — VITAMIN B12: Vitamin B-12: 323 pg/mL (ref 180–914)

## 2021-01-08 LAB — GLUCOSE, CAPILLARY
Glucose-Capillary: 103 mg/dL — ABNORMAL HIGH (ref 70–99)
Glucose-Capillary: 124 mg/dL — ABNORMAL HIGH (ref 70–99)
Glucose-Capillary: 140 mg/dL — ABNORMAL HIGH (ref 70–99)

## 2021-01-08 LAB — CBC
HCT: 28.3 % — ABNORMAL LOW (ref 39.0–52.0)
Hemoglobin: 8.7 g/dL — ABNORMAL LOW (ref 13.0–17.0)
MCH: 26.2 pg (ref 26.0–34.0)
MCHC: 30.7 g/dL (ref 30.0–36.0)
MCV: 85.2 fL (ref 80.0–100.0)
Platelets: 472 10*3/uL — ABNORMAL HIGH (ref 150–400)
RBC: 3.32 MIL/uL — ABNORMAL LOW (ref 4.22–5.81)
RDW: 19.5 % — ABNORMAL HIGH (ref 11.5–15.5)
WBC: 5.9 10*3/uL (ref 4.0–10.5)
nRBC: 0 % (ref 0.0–0.2)

## 2021-01-08 LAB — PROCALCITONIN: Procalcitonin: 0.17 ng/mL

## 2021-01-08 LAB — HIV ANTIBODY (ROUTINE TESTING W REFLEX): HIV Screen 4th Generation wRfx: NONREACTIVE

## 2021-01-08 MED ORDER — TORSEMIDE 20 MG PO TABS: 100.0000 mg | ORAL_TABLET | Freq: Every day | ORAL | Status: DC

## 2021-01-08 MED ORDER — TORSEMIDE 20 MG PO TABS
80.0000 mg | ORAL_TABLET | Freq: Two times a day (BID) | ORAL | Status: DC
  Administered 2021-01-08 – 2021-01-09 (×3): 80 mg via ORAL
  Filled 2021-01-08 (×3): qty 4

## 2021-01-08 MED ORDER — FUROSEMIDE 10 MG/ML IJ SOLN
80.0000 mg | Freq: Two times a day (BID) | INTRAMUSCULAR | Status: DC
  Administered 2021-01-08 – 2021-01-09 (×4): 80 mg via INTRAVENOUS
  Filled 2021-01-08 (×4): qty 8

## 2021-01-08 NOTE — Progress Notes (Addendum)
PROGRESS NOTE   Nicholas Caldwell  XIP:[id number] DOB: 03/23/1946 DOA: 01/05/2021 PCP: [name], MD  Brief Narrative:  75 year old community dwelling black male COPD OSA on BiPAP Chronic systolic heart failure 2/2 and ICM + RV failure + severe pulmonary HTN Nonobstructive CAD on cath 12/2018 PEA arrest 08/2019?  Narcotic over use and under use of right  IDDM P A. fib/flutter + DCCV 12/17/2020 Dr. Algernon Huxley (cannot tolerate Tikosyn)-failed amiodarone Patient has been hospitalized 5 times in the last 6 months-he tells me that every time he gains over 3 pounds he has been instructed by cardiology to come to the hospital for IV diuretics  Return [place] 3/6 AM S OB PND +10 pounds weight gain-baseline O2 rec = now 6 L up from 3  -Afebrile tachypneic incomplete RBBB with prolonged QTC CXR = CHF BNP 3522 troponin 33 Started on Lasix IV 80  Hospital-Problem based course  Acute decompensated multifactorial heart failure (severe pulmonary HTN right-sided failure) ATN superimposed on 3B versus cardiorenal syndrome P A. fib CHADS2 score >4 on Eliquis              Defer to cardiology further planning and medications Bilateral lower extremity pain and weakness that was sudden onset             Tells me he has "no answers" regarding why he became weak  suddenly  Dr. Parke Simmers evaluated 3/7-follow SPEP immunofixation methylmalonic IDDM A1c 6.8-sliding scale coverage at this time  CBG 100 120 eating 75%  meals  Resume 70/30 insulin at 10 units twice daily in the next several  days if eating more Dilutional anemia-history hemorrhoids  Iron studies performed = iron deficiency  Nulecit given 3/7 start  Back oral tid FeSO4  Outpatient colonoscopy? acute hypoxic respiratory failure as above OPD OSA Resume DuoNeb every 6 as needed, Incruse Ellipta 1 puff daily Sacral decubitus from prior to admission  Placing Gerhart cream and turn frequently     DVT prophylaxis: Eliquis Code Status: Full Family  Communication: Long discussion with wife on 3/8 at bedside Nicholas Caldwell [phone] There appears to be some disconnect-the patient states he does not want to go to rehab facility and absolutely refuses to go He seems to have some misunderstanding regarding what the hospital at home program offers and this will be clarified with cardiology tomorrow in terms of what they can do for him-it sounds like there multiple layers to his recurrent hospitalization and I appreciate their input and case managers aware as he is a high risk patient for another readmission Disposition:  Status is: Inpatient  Remains inpatient appropriate because:Hemodynamically unstable, Persistent severe electrolyte disturbances, Altered mental status and Ongoing diagnostic testing needed not appropriate for outpatient work up   Dispo: The patient is from: Home              Anticipated d/c is to: Home he has been recommended skilled several times and refuses              Patient currently is not medically stable to d/c.   Difficult to place patient No  Consultants:   Cardiology  Procedures:   Antimicrobials: None   Subjective:  Breathing better no distress Wife at bedside-she confirms she can be at home at all times-long discussions at the bedside with patient trying to clarify what hospital at home does-he tells me that he would "always want to come back to the hospital if he has shortness of breath" He seems confused about what the role  of hospital at home would be-I tried to explain to him that there was an irregular heart rhythm then definitely he should come in He does not seem very interested in this modality  Objective: Vitals:   01/08/21 0715 01/08/21 0743 01/08/21 0834 01/08/21 1000  BP:    (!) 114/56  Pulse:    74  Resp:    (!) 25  Temp:    98.1 F (36.7 C)  TempSrc:    Oral  SpO2:   95% 94%  Weight: 103.3 kg 103.3 kg      Intake/Output Summary (Last 24 hours) at 01/08/2021 1551 Last data filed  at 01/08/2021 1200 Gross per 24 hour  Intake 720 ml  Output 1250 ml  Net -530 ml   Filed Weights   01/07/21 0500 01/08/21 0715 01/08/21 0743  Weight: 104.2 kg 103.3 kg 103.3 kg    Examination: Coherent no distress JVD present still S1-S2 seems to be in A. fib/flutter Abdomen soft no rebound no guarding Neurologically intact to upper extremities but weak right side >left side-did not examine in detail right lower extremity   Data Reviewed: personally reviewed   Data White count 5.9 hemoglobin 8.7 platelet 472 BUNs/creatinine 37/2.1-->49/2.1-->47/2.1--41/1.9  procalcitonin 0.17--->and 0.23 Atrial flutter 3-1 on today's EKG with no T wave inversions  Radiology Studies: No results found.   Scheduled Meds: . amiodarone  200 mg Oral Daily  . apixaban  5 mg Oral BID  . atorvastatin  40 mg Oral QHS  . feeding supplement (GLUCERNA SHAKE)  237 mL Oral TID BM  . ferrous sulfate  325 mg Oral Q M,W,F-1800  . fluticasone  1 spray Each Nare Daily  . furosemide  80 mg Intravenous BID  . Gerhardt's butt cream  1 application Topical BID  . insulin aspart  0-9 Units Subcutaneous TID WC  . mometasone-formoterol  1 puff Inhalation BID   And  . umeclidinium bromide  1 puff Inhalation Daily  . potassium chloride SA  20 mEq Oral BID  . tamsulosin  0.4 mg Oral QPC breakfast  . [START ON 01/09/2021] torsemide  80 mg Oral BID   Continuous Infusions: . sodium chloride       LOS: 2 days   Time spent: [place], MD [facility] To contact the attending provider between 7A-7P or the covering provider during after hours 7P-7A, please log into the web site [email or web address] and access using universal [place] password for that web site. If you do not have the password, please call the hospital operator.  01/08/2021, 3:51 PM

## 2021-01-08 NOTE — Progress Notes (Signed)
.  PT Cancellation Note  Patient Details Name: Nicholas Caldwell MRN: [id number] DOB: 1946/04/30   Cancelled Treatment:    Reason Eval/Treat Not Completed: Patient declined, no reason specified (pt reports having been in chair for several hours this morning then back to bed and currently declines any therapy due to fatigue, need for condom cath and wanting lunch (tray present with no attempt to eat it))   Kaheem Halleck B Lailah Marcelli 01/08/2021, 11:47 AM [place] Pager: [phone] Office: [phone]

## 2021-01-08 NOTE — TOC Progression Note (Signed)
Transition of Care (TOC) - Progression Note Heart Failure   Patient Details  Name: Nicholas Caldwell MRN: [id number] Date of Birth: [birth year]  Transition of Care [facility]) CM/SW [place], [place] Phone Number: 01/08/2021, 4:26 PM  Clinical Narrative:    CSW completed SDOH with the patient who denied having any needs at this time. [name] reported wanting home health and a caregiver in the home and CSW reported that will be discussed upon discharge. CSW provided the patient and family member with social workers name and position and if anything changes to please reach out so that CSW can provide support.         Expected Discharge Plan and Services                                                 Social Determinants of Health (SDOH) Interventions Food Insecurity Interventions: Intervention Not Indicated Financial Strain Interventions: Intervention Not Indicated Housing Interventions: Intervention Not Indicated Transportation Interventions: Intervention Not Indicated  Readmission Risk Interventions Readmission Risk Prevention Plan 11/27/2020 11/23/2020 08/15/2019  Transportation Screening - Complete Complete  PCP or Specialist Appt within 3-5 Days Complete - -  HRI or Home Care Consult Complete - -  Social Work Consult for [place] Planning/Counseling Complete - -  Palliative Care Screening Not Applicable - -  Medication Review Press photographer) Complete Complete Not Complete  Med Review Comments - - requested discharge medication reconciliation by attending  PCP or Specialist appointment within 3-5 days of discharge - - Not Complete  PCP/Specialist Appt Not Complete comments - - [facility] informed [facility] and will contact pt directly regarding follow up appt  [place] or [place] - Complete Complete  SW Recovery Care/Counseling Consult - Complete -  [place] - Not Applicable -  Some recent  data might be hidden   [place], MSW, LCSWA [phone] Heart Failure Social Worker

## 2021-01-08 NOTE — Consult Note (Signed)
   [facility] Inpatient Consult   01/08/2021  Nicholas Caldwell June 22, 1946 [id number]   [place] Organization [ACO] Patient: Nicholas Caldwell  Primary Care Provider:  [name], MD [facility]   Patient screened for 5 hospitalization in the past 6 months with noted extreme high risk score for unplanned readmission risk. Electronic Medical record reviewed to assess for potential restart of [place] [facility] service needs for post hospital transition.  [facility] Care Management has made attempts to engage however, member/family declines services noted.  Review of patient's medical record reveals patient is active with the Advanced HF team as well as his primary care provider is the [facility] listed as Dr. [name], MD [place].  Plan: Will follow up with inpatient [facility] team regarding needs for post hospital transition for chronic care management.  Continue to follow progress and disposition to assess for post hospital care management needs.    For questions contact:   Natividad Brood, RN BSN [facility] [facility] Liaison  [phone] business mobile phone Toll free office [phone]  Fax number: [phone] [email or web address]

## 2021-01-08 NOTE — Progress Notes (Addendum)
Advanced Heart Failure Rounding Note  PCP-Cardiologist: No primary care provider on file.   Subjective:    Yesterday diuresed with IV lasix. Negative 1.9 liters.   Feeling ok. He is concerned about taking amiodarone.   Objective:   Weight Range: 70.1 kg Body mass index is 20.96 kg/m.   Vital Signs:   Temp:  [97.9 F (36.6 C)-98.8 F (37.1 C)] 97.9 F (36.6 C) (03/08 0700) Pulse Rate:  [70-73] 72 (03/08 0700) Resp:  [19-26] 19 (03/08 0700) BP: (84-110)/(31-69) 110/53 (03/08 0700) SpO2:  [90 %-95 %] 92 % (03/08 0700) Weight:  [70.1 kg] 70.1 kg (03/08 0300) Last BM Date: 01/07/21  Weight change: Filed Weights   01/06/21 0500 01/07/21 0500 01/08/21 0300  Weight: 105.8 kg 104.2 kg 70.1 kg    Intake/Output:   Intake/Output Summary (Last 24 hours) at 01/08/2021 0734 Last data filed at 01/08/2021 0600 Gross per 24 hour  Intake 480 ml  Output 2450 ml  Net -1970 ml      Physical Exam    General: Sitting in the chair. No resp difficulty HEENT: Normal Neck: Supple. JVP 6-7 . Carotids 2+ bilat; no bruits. No lymphadenopathy or thyromegaly appreciated. Cor: PMI nondisplaced. Irregular rate & rhythm. No rubs, gallops or murmurs. Lungs: Clear on 5 liters [place] Abdomen: obese, Soft, nontender, nondistended. No hepatosplenomegaly. No bruits or masses. Good bowel sounds. Extremities: No cyanosis, clubbing, rash, edema Neuro: Alert & orientedx3, cranial nerves grossly intact. moves all 4 extremities w/o difficulty. Affect pleasant   Telemetry  A flutter 70s --Will get EKG  EKG   A flutter 3:1 73 bpm today   Labs    CBC Recent Labs    01/07/21 0057 01/08/21 0032  WBC 5.3 5.9  HGB 8.5* 8.7*  HCT 29.1* 28.3*  MCV 86.6 85.2  PLT 460* 998*   Basic Metabolic Panel Recent Labs    01/06/21 0437 01/07/21 0057 01/08/21 0032  NA  --  141 140  K  --  3.7 3.8  CL  --  100 98  CO2  --  30 33*  GLUCOSE  --  130* 106*  BUN  --  47* 41*  CREATININE  --  2.11* 1.92*   CALCIUM  --  8.3* 8.6*  MG 2.0  --   --    Liver Function Tests No results for input(s): AST, ALT, ALKPHOS, BILITOT, PROT, ALBUMIN in the last 72 hours. No results for input(s): LIPASE, AMYLASE in the last 72 hours. Cardiac Enzymes No results for input(s): CKTOTAL, CKMB, CKMBINDEX, TROPONINI in the last 72 hours.  BNP: BNP (last 3 results) Recent Labs    11/21/20 1944 12/11/20 0730 01/05/21 2213  BNP 2,475.4* 1,858.3* 3,522.2*    ProBNP (last 3 results) No results for input(s): PROBNP in the last 8760 hours.   D-Dimer No results for input(s): DDIMER in the last 72 hours. Hemoglobin A1C No results for input(s): HGBA1C in the last 72 hours. Fasting Lipid Panel No results for input(s): CHOL, HDL, LDLCALC, TRIG, CHOLHDL, LDLDIRECT in the last 72 hours. Thyroid Function Tests No results for input(s): TSH, T4TOTAL, T3FREE, THYROIDAB in the last 72 hours.  Invalid input(s): FREET3  Other results:   Imaging     No results found.   Medications:     Scheduled Medications: . amiodarone  200 mg Oral Daily  . apixaban  5 mg Oral BID  . atorvastatin  40 mg Oral QHS  . feeding supplement (GLUCERNA SHAKE)  237 mL  Oral TID BM  . ferrous sulfate  325 mg Oral Q M,W,F-1800  . fluticasone  1 spray Each Nare Daily  . furosemide  80 mg Intravenous BID  . Gerhardt's butt cream  1 application Topical BID  . insulin aspart  0-9 Units Subcutaneous TID WC  . mometasone-formoterol  1 puff Inhalation BID   And  . umeclidinium bromide  1 puff Inhalation Daily  . potassium chloride SA  20 mEq Oral BID  . tamsulosin  0.4 mg Oral QPC breakfast     Infusions: . sodium chloride       PRN Medications:  sodium chloride, acetaminophen, famotidine, fluticasone, ipratropium-albuterol, LORazepam, oxybutynin, oxyCODONE-acetaminophen **AND** oxyCODONE    Assessment/Plan  1. Acute on Chronic Combined Systolic + Diastolic Heart Failure w/ Prominent RV Failure: Likely related to  COPD/OSA/OHS.H/o poor compliance w/ home O2 and BiPAP.Echo in 11/21 withEF 50-55%, RV severely reduced w/ severely elevated RV systolic pressure, 68 mmHg.TEE 12/2020 LVEF mildly reduced 45-50%. RV severely reduced. Admitted for ADHF. BNP higher from prior baseline, 3,522. CXR w/ moderate left pleural effusion + mild pulmonary edema - Suspect recurrent AFL contributing. Plan repeat attempt at DCCV after diuresis  - Volume status improving. Give IV Lasix today then po tomorrow.   - Start torsemide 80 mg bid tomorrow.  - No spiro/dig/arni with elevated creatinine.  - Would not use SGT2i due to chronic groin/skin infection.  - Renal function stable.  2. Atrial Fibrillation/ Flutter: Paroxysmal. He has history of CVA. He had DCCV in 3/20 and again in 5/20. He failed amiodarone in the past and cannot take Tikosyn due to baseline prolonged QT interval. Underwent recent TEE/DCCV for AFL 12/17/20. He was back in Atrial flutter with controlled rate.  PO amiodarone held on admit due to pronged QTc 543 ms but was in A flutter QTc not accurate.  - EKG today shows persistent A flutter.  - Amio 200 mg restarted per Dr [name].  - Continue Eliquis 5 mg bid  - reports full compliance w/ Eliquis. Plan repeat attempt at [place] after diuresis  - ? AFL ablation, EP consult.  - needs to improve compliance w/ home BiPAP  3. ChronicHypoxic/Hypercarbic Respiratory failure: Due toADCHF + COPD/OSA/OHS w/ poor compliance w/ home O2 and BiPAP. 4. Stage III CKD: new baseline ~2.2 - stable at 1.9 today. Follow w/ diuresis  5. COPD:No longer smokes. Continue homeoxygen.  - No wheeze on exam. 6.OHS/OSA:Continue oxygen during the day andBIPAP at night.  7.CAD:Nonobstructive disease in 2/20. No chest pain.  - no ASA due to anticoagulation. -Atorvastatin.  Consult EP for possible A flutter ablation.   He has cancelled his last 3 appointments in HF clinic. Will ask SW to talk with him given frequent readmits. He  has THN but not active. He has been admitted monthly since November for heart failure.    Length of Stay: 2  Darrick Grinder, NP  01/08/2021, 7:34 AM  Advanced Heart Failure Team Pager [phone] (M-F; 7a - 5p)  Please contact [facility] for night-coverage after hours (4p -7a ) and weekends on [email or web address]  Patient seen with NP, agree with the above note.   He remains in atrial flutter.  He diuresed well yesterday.  Breathing better.  Remains in atrial flutter.   General: NAD Neck: JVP difficult, ?10 cm, no thyromegaly or thyroid nodule.  Lungs: Decreased at bases.  CV: Nondisplaced PMI.  Heart regular S1/S2, no S3/S4, no murmur.  Trace ankle edema.   Abdomen: Soft, nontender, no  hepatosplenomegaly, no distention.  Skin: Intact without lesions or rashes.  Neurologic: Alert and oriented x 3.  Psych: Normal affect. Extremities: No clubbing or cyanosis.  HEENT: Normal.   Continue Lasix 80 mg IV bid today, transition to torsemide 80 mg po bid tomorrow.   He remains in atrial flutter.  Amiodarone is ok, QTc on ECG is measuring flutter waves.  Flutter is breaking through amiodarone, will ask EP to see.  Would consider ablation for more definitive treatment.  If not, will cardiovert again prior to discharge.   Loralie Champagne 01/08/2021 8:20 AM

## 2021-01-08 NOTE — Progress Notes (Addendum)
Patient has asked for pain medication, he wants oxycodone along with percocet at same time, patient's blood pressure is 89/70, I have advised patient that narcotics are central nervous system depressants and his blood pressure is too low to give both meds, we are diuresing him heavily with lasix so lots of fluid loss is also causing soft blood pressure, so it is currently not safe to give meds like he asking for, I have suggested taking just tylenol, patient has refused, I have talked with charge nurse, creshenda, and she agrees with my decision, patient still refuses, I have wasted oxcodone at [facility] with Chat, RN

## 2021-01-08 NOTE — Consult Note (Addendum)
ELECTROPHYSIOLOGY CONSULT NOTE    Patient ID: Nicholas Caldwell MRN: [id number], DOB/AGE: 1946/08/11 75 y.o.  Admit date: 01/05/2021 Date of Consult: 01/08/2021  Primary Physician: [name], MD Primary Cardiologist: No primary care provider on file.  Electrophysiologist: Dr. Caryl Comes (Dr. Rayann Heman has previously consulted in 2016 and 2020).   Referring Provider: [name]  Patient Profile: Nicholas Caldwell is a 76 y.o. male with a history of chronic primarily diastolic CHF with severe RV dysfunction, COPD on 3 L O2, DM, HTN, OHS/OSA on CPAP,paroxysmal atrial flutter/fibrillations/p DCCV, hyperlipidemia, CAD, and noncompliance.He has multiple admissions due to HF and respiratory failure.Most recent echo in 11/21 showed EF 50-55% with severely enlarged and severely dysfunctional RV, PASP 68 mmHg, moderate-severe TR who is being seen today for the evaluation of persistent atrial flutter at the request of [name].  HPI:  Nicholas Caldwell is a 75 y.o. male with complicated medical history as above.   Admitted 2/8 - 12/19/20 with w/ abdominal pain/nausea/poor po intake for about 2 wks.CXR showed extensive left-sided PNA, and creatinine was up to 3.56 from baseline around 1.8. CT abd/pelvis showed copious colonic stool. He was also noted to be in rate-controlled atrial flutter (had been in NSR in the recent past). He was markedly hypokalemic. Eliquis was held with encephalopathy and he was started on heparin gtt. He was started on vancomycin/cefepime/Flagyl. K repleated. He was started on IV fluid w/ improvement in symptoms. SCr improved and stablized ~2.2. Was eventually restarted back on PO diuretics, torsemide 80 mg daily. Prior to d/c, he underwent successful TEE guided DCCV back to NSR. Of note, LVEF was slightly reduced at 45-50%. RV severely reduced.   Readmitted 01/05/2021 with worsening SOB, bilateral LEE, and 10 lb weight gain. Noted to have increased 02 demand on admission COVID and flu  negative. BNP elevated at 3,522 (up from 1,858 previous admit). PCT 0.17>>0.23. Hs trop 33>>33. Back in 3:1 Atrial flutter on EKG, HR 70s.  SCr 2.2. Hgb 9.5>>8.5. CXR w/ moderate left pleural effusion. + mild pulmonary edema  Diuresed with IV lasix. Has remained in atrial flutter. Amiodarone was held on admit due to EKG read as prolonged QTc. This has been restarted.   Currently, he is feeling OK. He is worried about the long term effects of amiodarone. He reports compliance with his medications recently, and compliance with his CPAP at home.   TEE 12/2020 LVEF 45-50%, RV severely reduced   2D Echo 09/2020 (Left atrium poorly visualized) 1. Left ventricular ejection fraction, by estimation, is 50 to 55%. The left ventricle has low normal function. The left ventricle has no regional wall motion abnormalities. Left ventricular diastolic function could not be evaluated. 2. Right ventricular systolic function is severely reduced. The right ventricular size is severely enlarged. There is severely elevated pulmonary artery systolic pressure. The estimated right ventricular systolic pressure is 10.2 mmHg. 3. Right atrial size was severely dilated. 4. The mitral valve was not well visualized. Trivial mitral valve regurgitation. 5. Tricuspid valve regurgitation is moderate to severe. 6. The aortic valve was not well visualized. Aortic valve regurgitation is not visualized. 7. Severely dilated pulmonary artery.  Echo 08/21/2019 LVEF 60-65%, RV severely reduced LA diam 4.7 cm  Echo 07/08/2019  LA diam 3.8 cm  Past Medical History:  Diagnosis Date  . Atrial flutter ([place])    a. recurrent AFlutter with RVR;  b. Amiodarone Rx started 4/16  . CAD (coronary artery disease)    a. LHC 1/16:  mLAD diffuse disease,  pLCx mild disease, dLCx with disease but too small for PCI, RCA ok, EF 25-30%  . Chronic pain   . Chronic systolic CHF (congestive heart failure) ([place])   . COPD (chronic obstructive pulmonary  disease) ([place])   . Diabetes mellitus without complication ([place])   . Ex-smoker 11/2017  . Hypercholesteremia   . Hypertension   . NICM (nonischemic cardiomyopathy) ([place])    a.dx 2016. b. 2D echo 06/2016 - Last echo 07/01/16: mod dilated LV, mod LVH, EF 25-30%, mild-mod MR, sev LAE, mild-mod reduced RV systolic function, mild-mod TR, PASP 80mHG.  .Marland KitchenPAF (paroxysmal atrial fibrillation) ([facility])    On amio - ot a candidate for flecainide due to cardiomyopathy, not a candidate for Tikosyn due to prolonged QT, and felt to be a poor candidate for ablation given left atrial size.  . Pulmonary hypertension ([place]   . Tobacco abuse      Surgical History:  Past Surgical History:  Procedure Laterality Date  . BIOPSY  03/11/2019   Procedure: BIOPSY;  Surgeon: CLavena Bullion DO;  [place]  Service: Gastroenterology;;  . BIOPSY  03/15/2019   Procedure: BIOPSY;  Surgeon: MIrving Copas, MD;  [place]  Service: Gastroenterology;;  . CARDIOVERSION N/A 07/30/2018   Procedure: CARDIOVERSION;  Surgeon: MLarey Dresser MD;  [facility];  Service: Cardiovascular;  Laterality: N/A;  . CARDIOVERSION N/A 12/17/2020   Procedure: CARDIOVERSION;  Surgeon: MLarey Dresser MD;  [facility];  Service: Cardiovascular;  Laterality: N/A;  . COLONOSCOPY N/A 03/11/2019   Procedure: COLONOSCOPY;  Surgeon: CLavena Bullion DO;  [place]  Service: Gastroenterology;  Laterality: N/A;  . ENTEROSCOPY N/A 03/15/2019   Procedure: ENTEROSCOPY;  Surgeon: MRush LandmarkGTelford Nab, MD;  [place] [facility]  Service: Gastroenterology;  Laterality: N/A;  . ESOPHAGOGASTRODUODENOSCOPY Left 03/11/2019   Procedure: ESOPHAGOGASTRODUODENOSCOPY (EGD);  Surgeon: CLavena Bullion DO;  [facility];  Service: Gastroenterology;  Laterality: Left;  . LEFT AND RIGHT HEART CATHETERIZATION WITH CORONARY ANGIOGRAM N/A 11/06/2014   Procedure: LEFT AND RIGHT HEART  CATHETERIZATION WITH CORONARY ANGIOGRAM;  Surgeon: JJettie Booze MD;  [facility];  Service: Cardiovascular;  Laterality: N/A;  . RIGHT/LEFT HEART CATH AND CORONARY ANGIOGRAPHY N/A 12/06/2018   Procedure: RIGHT/LEFT HEART CATH AND CORONARY ANGIOGRAPHY;  Surgeon: MLarey Dresser MD;  [facility];  Service: Cardiovascular;  Laterality: N/A;  . SPLENECTOMY    . SUBMUCOSAL TATTOO INJECTION  03/15/2019   Procedure: SUBMUCOSAL TATTOO INJECTION;  Surgeon: MIrving Copas, MD;  [place]  Service: Gastroenterology;;  . TEE WITHOUT CARDIOVERSION N/A 12/17/2020   Procedure: TRANSESOPHAGEAL ECHOCARDIOGRAM (TEE);  Surgeon: MLarey Dresser MD;  [facility];  Service: Cardiovascular;  Laterality: N/A;     Medications Prior to Admission  Medication Sig Dispense Refill Last Dose  . acetaminophen (TYLENOL) 325 MG tablet Take 2 tablets (650 mg total) by mouth every 4 (four) hours as needed for headache or mild pain.   Past Month at Unknown time  . albuterol (VENTOLIN HFA) 108 (90 Base) MCG/ACT inhaler Inhale 2 puffs into the lungs every 6 (six) hours as needed for wheezing or shortness of breath.   01/05/2021 at Unknown time  . amiodarone (PACERONE) 200 MG tablet Take 1 tablet twice daily for next 2 weeks followed by 1 tablet daily. (Patient taking differently: Take 200 mg by mouth daily.) 45 tablet 0 01/05/2021 at Unknown time  . apixaban (ELIQUIS) 5 MG  TABS tablet Take 1 tablet (5 mg total) by mouth 2 (two) times daily. 60 tablet 0 01/05/2021 at 0900  . atorvastatin (LIPITOR) 40 MG tablet Take 1 tablet (40 mg total) by mouth daily. (Patient taking differently: Take 40 mg by mouth at bedtime.) 90 tablet 3 01/04/2021  . Budeson-Glycopyrrol-Formoterol (BREZTRI AEROSPHERE) 160-9-4.8 MCG/ACT AERO Inhale 2 puffs into the lungs 2 (two) times daily. 10.7 g 11 01/05/2021 at Unknown time  . carbamide peroxide (DEBROX) 6.5 % OTIC solution Place 5-10 drops into both  ears 3 (three) times daily as needed (ear wax).   unk  . Carboxymethylcellulose Sod PF 0.25 % SOLN Place 1 drop into both eyes 2 (two) times daily as needed (dry eyes).   Past Month at Unknown time  . cetirizine (ZYRTEC) 10 MG tablet Take 10 mg by mouth at bedtime.   01/05/2021 at Unknown time  . Cholecalciferol (VITAMIN D) 50 MCG (2000 UT) CAPS Take 2,000 Units by mouth daily.   01/05/2021 at Unknown time  . diazepam (VALIUM) 10 MG tablet Take 10 mg by mouth 3 (three) times daily as needed for seizure.   Past Month at Unknown time  . famotidine (PEPCID) 20 MG tablet TAKE 1 TABLET(20 MG) BY MOUTH TWICE DAILY (Patient taking differently: Take 20 mg by mouth 2 (two) times daily as needed for indigestion.) 60 tablet 2 Past Month at Unknown time  . feeding supplement, GLUCERNA SHAKE, (GLUCERNA SHAKE) LIQD Take 237 mLs by mouth 3 (three) times daily between meals. 237 mL 0 01/05/2021 at Unknown time  . ferrous sulfate 325 (65 FE) MG tablet Take 325 mg by mouth every Monday, Wednesday, and Friday at 6 PM.   01/04/2021  . fluticasone (FLONASE) 50 MCG/ACT nasal spray Place 2 sprays into both nostrils daily as needed for allergies.   unk  . hydrocortisone (ANUSOL-HC) 2.5 % rectal cream Place 1 application rectally 2 (two) times daily as needed for hemorrhoids or itching.    Past Month at Unknown time  . hydrocortisone 1 % lotion Apply 1 application topically 2 (two) times daily. Back   01/05/2021 at Unknown time  . insulin aspart (NOVOLOG) 100 UNIT/ML injection Inject 0-15 Units into the skin 3 (three) times daily with meals. (Patient taking differently: Inject 4 Units into the skin 3 (three) times daily with meals.) 10 mL 11 01/05/2021 at Unknown time  . insulin aspart protamine- aspart (NOVOLOG MIX 70/30) (70-30) 100 UNIT/ML injection Inject 10 Units into the skin 2 (two) times daily with a meal.   01/05/2021 at Unknown time  . Insulin Syringe 27G X 1/2" 0.5 ML MISC 100 Syringes by Does not apply route 3 (three) times  daily. 100 each 1   . ipratropium-albuterol (DUONEB) 0.5-2.5 (3) MG/3ML SOLN Take 3 mLs by nebulization 4 (four) times daily as needed.   01/05/2021 at Unknown time  . latanoprost (XALATAN) 0.005 % ophthalmic solution Place 1 drop into both eyes at bedtime.   01/04/2021  . oxybutynin (DITROPAN) 5 MG tablet Take 1 tablet by mouth 4 (four) times daily as needed for incontinence.   Past Month at Unknown time  . oxyCODONE-acetaminophen (PERCOCET) 10-325 MG tablet Take 1 tablet by mouth every 6 (six) hours as needed for pain.   01/05/2021 at Unknown time  . OXYGEN Inhale 3 L/min into the lungs continuous.     . pantoprazole (PROTONIX) 40 MG tablet Take 40 mg by mouth 2 (two) times daily.   01/05/2021 at Unknown time  . polyethylene glycol  powder (GLYCOLAX/MIRALAX) 17 GM/SCOOP powder Take 17 g by mouth daily as needed for constipation.   Past Month at Unknown time  . potassium chloride SA (KLOR-CON) 20 MEQ tablet Take 1 tablet (20 mEq total) by mouth 2 (two) times daily. (Patient taking differently: Take 40 mEq by mouth 2 (two) times daily.) 60 tablet 0 01/05/2021 at Unknown time  . senna-docusate (SENOKOT-S) 8.6-50 MG tablet Take 1 tablet by mouth at bedtime as needed for mild constipation.   unk  . silver sulfADIAZINE (SILVADENE) 1 % cream Apply topically daily. (Patient taking differently: Apply 1 application topically daily.) 50 g 0 01/05/2021 at Unknown time  . sodium chloride (OCEAN) 0.65 % SOLN nasal spray Place 2 sprays into both nostrils as needed for congestion.    unk  . tamsulosin (FLOMAX) 0.4 MG CAPS capsule Take 1 capsule (0.4 mg total) by mouth daily. (Patient taking differently: Take 0.4 mg by mouth daily after breakfast.) 30 capsule  01/05/2021 at Unknown time  . terbinafine (LAMISIL) 1 % cream Apply 1 application topically daily.   01/05/2021 at Unknown time  . torsemide 40 MG TABS Take 80 mg by mouth daily. 30 tablet 0 01/05/2021 at Unknown time  . lidocaine (LIDODERM) 5 % Place 1 patch onto the skin  daily. Remove & Discard patch within 12 hours or as directed by MD (Patient not taking: No sig reported) 30 patch 0 Not Taking at Unknown time  . umeclidinium bromide (INCRUSE ELLIPTA) 62.5 MCG/INH AEPB Inhale 1 puff into the lungs daily. (Patient not taking: No sig reported) 30 each 0 Not Taking at Unknown time    Inpatient Medications:  . amiodarone  200 mg Oral Daily  . apixaban  5 mg Oral BID  . atorvastatin  40 mg Oral QHS  . feeding supplement (GLUCERNA SHAKE)  237 mL Oral TID BM  . ferrous sulfate  325 mg Oral Q M,W,F-1800  . fluticasone  1 spray Each Nare Daily  . furosemide  80 mg Intravenous BID  . Gerhardt's butt cream  1 application Topical BID  . insulin aspart  0-9 Units Subcutaneous TID WC  . mometasone-formoterol  1 puff Inhalation BID   And  . umeclidinium bromide  1 puff Inhalation Daily  . potassium chloride SA  20 mEq Oral BID  . tamsulosin  0.4 mg Oral QPC breakfast  . [START ON 01/09/2021] torsemide  80 mg Oral BID    Allergies:  Allergies  Allergen Reactions  . Isosorb Dinitrate-Hydralazine Shortness Of Breath  . Benazepril Nausea And Vomiting  . Brimonidine Other (See Comments)    Dry eyes  . Metformin Other (See Comments)    Cramps and abdominal pain  . Morphine Rash    Social History   Socioeconomic History  . Marital status: Married    Spouse name: Not on file  . Number of children: Not on file  . Years of education: Not on file  . Highest education level: Not on file  Occupational History  . Not on file  Tobacco Use  . Smoking status: Former Smoker    Packs/day: 1.00    Years: 34.00    Pack years: 34.00    Types: Cigarettes    Quit date: 11/30/2018    Years since quitting: 2.1  . Smokeless tobacco: Never Used  Vaping Use  . Vaping Use: Never used  Substance and Sexual Activity  . Alcohol use: No    Alcohol/week: 0.0 standard drinks  . Drug use: No  .  Sexual activity: Not on file  Other Topics Concern  . Not on file  Social History  Narrative   ** Merged History Encounter **       Social Determinants of Health   Financial Resource Strain: Not on file  Food Insecurity: Not on file  Transportation Needs: Not on file  Physical Activity: Not on file  Stress: Not on file  Social Connections: Not on file  Intimate Partner Violence: Not on file     Family History  Problem Relation Age of Onset  . Heart disease Mother   . Hypertension Mother   . Heart failure Mother   . Heart disease Father   . Hypertension Sister   . Heart attack Neg Hx   . Stroke Neg Hx   . Colon cancer Neg Hx   . Esophageal cancer Neg Hx      Review of Systems: All other systems reviewed and are otherwise negative except as noted above.  Physical Exam: Vitals:   01/08/21 0700 01/08/21 0715 01/08/21 0743 01/08/21 0834  BP: (!) 110/53     Pulse: 72     Resp: 19     Temp: 97.9 F (36.6 C)     TempSrc: Oral     SpO2: 92%   95%  Weight:  103.3 kg 103.3 kg     GEN- The patient is well appearing, alert and oriented x 3 today.   HEENT: normocephalic, atraumatic; sclera clear, conjunctiva pink; hearing intact; oropharynx clear; neck supple Lungs- Clear to ausculation bilaterally, normal work of breathing.  No wheezes, rales, rhonchi Heart- Regular rate and rhythm, no murmurs, rubs or gallops GI- soft, non-tender, non-distended, bowel sounds present Extremities- no clubbing, cyanosis, or edema; DP/PT/radial pulses 2+ bilaterally MS- no significant deformity or atrophy Skin- warm and dry, no rash or lesion Psych- euthymic mood, full affect Neuro- strength and sensation are intact  Labs:   Lab Results  Component Value Date   WBC 5.9 01/08/2021   HGB 8.7 (L) 01/08/2021   HCT 28.3 (L) 01/08/2021   MCV 85.2 01/08/2021   PLT 472 (H) 01/08/2021    Recent Labs  Lab 01/08/21 0032  NA 140  K 3.8  CL 98  CO2 33*  BUN 41*  CREATININE 1.92*  CALCIUM 8.6*  GLUCOSE 106*      Radiology/Studies: CT Abdomen Pelvis Wo  Contrast  Result Date: 12/11/2020 CLINICAL DATA:  Abdominal distension. Abscess or infection suspected. Nausea and vomiting EXAM: CT ABDOMEN AND PELVIS WITHOUT CONTRAST TECHNIQUE: Multidetector CT imaging of the abdomen and pelvis was performed following the standard protocol without IV contrast. COMPARISON:  CT 10/05/2020 CT 01/03/2019 FINDINGS: Lower chest: Dense consolidation LEFT lung base. Interlobular septal thickening at the RIGHT lung base. 7 mm nodule in the RIGHT lower lobe on image 7/4. Interval improvement in RIGHT pleural effusion. LEFT basilar consolidation is worse. Hepatobiliary: No focal hepatic lesion on noncontrast exam. Gallbladder is distended to 5.7 cm. No radiodense gallstones. No gallbladder inflammation. Intrahepatic duct dilatation on noncontrast exam Pancreas: Pancreas is normal. No ductal dilatation. No pancreatic inflammation. Spleen: Multiple splenules in the LEFT upper quadrant. Adrenals/urinary tract: Of LEFT adrenal gland thickening to 19 mm has attenuation greater than benign adenoma. No nephrolithiasis or ureterolithiasis Stomach/Bowel: Stomach, small bowel, appendix, and cecum are normal. Ascending transverse colon normal. There is moderate volume stool in the descending colon. Large volume stool in the rectosigmoid colon. Stool ball in the rectum measuring 7 cm. Vascular/Lymphatic: Abdominal aorta is normal caliber with  atherosclerotic calcification. There is no retroperitoneal or periportal lymphadenopathy. No pelvic lymphadenopathy. Reproductive: Prostate unremarkable Other: No intraperitoneal free air Musculoskeletal: LEFT hip prosthetic.  No acute osseous findings IMPRESSION: 1. Large volume stool in the rectosigmoid suggest constipation. 2. Dense consolidation in the LEFT lower lobe new from comparison exam. 3. Improvement in RIGHT pleural effusion. 4. RIGHT lobe pulmonary nodule. Recommend follow-up CT chest in 3 months. 5. Distended gallbladder without evidence acute  cholecystitis. 6. Nodular thickening of the LEFT adrenal gland similar to CT 01/03/2019 Electronically Signed   By: Suzy Bouchard M.D.   On: 12/11/2020 14:28   DG Chest 2 View  Result Date: 01/05/2021 CLINICAL DATA:  Dyspnea, lower extremity edema EXAM: CHEST - 2 VIEW COMPARISON:  12/11/2020 FINDINGS: Pulmonary insufflation is stable the lungs are symmetrically expanded. Moderate left pleural effusion is present. Tiny right pleural effusion is suspected. Diffuse interstitial pulmonary infiltrate is present demonstrating a basilar predominance most in keeping with mild pulmonary edema, likely cardiogenic in nature. Mild to moderate cardiomegaly is unchanged. Central pulmonary arterial enlargement is again noted. No pneumothorax. No acute bone abnormality. IMPRESSION: Mild cardiogenic failure.  Small bilateral pleural effusions. Electronically Signed   By: Fidela Salisbury MD   On: 01/05/2021 23:09   CT Head Wo Contrast  Result Date: 12/11/2020 CLINICAL DATA:  Altered mental status. EXAM: CT HEAD WITHOUT CONTRAST TECHNIQUE: Contiguous axial images were obtained from the base of the skull through the vertex without intravenous contrast. COMPARISON:  Head CT scan 04/09/2019. FINDINGS: Brain: No evidence of acute infarction, hemorrhage, hydrocephalus, extra-axial collection or mass lesion/mass effect. Chronic microvascular ischemic change and cavum septum pellucidum et vergae noted. Vascular: No hyperdense vessel.  Atherosclerosis is seen. Skull: Intact.  No focal lesion. Sinuses/Orbits: Negative. Other: None. IMPRESSION: No acute abnormality. Chronic microvascular ischemic change. Atherosclerosis. Electronically Signed   By: Inge Rise M.D.   On: 12/11/2020 14:00   US RENAL  Result Date: 12/11/2020 CLINICAL DATA:  Acute renal failure EXAM: RENAL / URINARY TRACT ULTRASOUND COMPLETE COMPARISON:  None. FINDINGS: Right Kidney: Renal measurements: 10.6 x 6.1 x 5.6 cm = volume: 188 mL. Echogenicity within  normal limits. No mass or hydronephrosis visualized. Left Kidney: Renal measurements: 10.9 x 5.3 x 4.8 cm = volume: 143 mL. Echogenicity within normal limits. No mass or hydronephrosis visualized. Bladder: Bladder decompressed and poorly assessed by sonography. Other: None. IMPRESSION: 1. Unremarkable sonographic appearance of the kidneys. 2. Decompressed urinary bladder poorly assessed by sonography. Electronically Signed   By: Lovena Le M.D.   On: 12/11/2020 18:44   DG Chest Port 1 View  Result Date: 12/11/2020 CLINICAL DATA:  Weakness EXAM: PORTABLE CHEST 1 VIEW COMPARISON:  11/21/2020 FINDINGS: Cardiac shadow is enlarged but stable. Aortic calcifications are again seen. Persistent left basilar airspace disease with associated effusion is noted. Some interval clearing in the right base is noted. Persistent central vascular congestion is seen. IMPRESSION: Persistent vascular congestion. Stable left basilar airspace opacity with a fusion although clearing in the right base is noted. Electronically Signed   By: Inez Catalina M.D.   On: 12/11/2020 12:02   ECHO TEE  Result Date: 12/17/2020    TRANSESOPHOGEAL ECHO REPORT   Patient Name:   ALVON NYGAARD Date of Exam: 12/17/2020 Medical Rec #:  [id number]    Height:       72.0 in Accession #:    [id number]   Weight:       212.1 lb Date of Birth:  04-22-1946    BSA:  2.184 m Patient Age:    64 years     BP:           119/54 mmHg Patient Gender: M            HR:           91 bpm. Exam Location:  Inpatient Procedure: Transesophageal Echo, Cardiac Doppler and Color Doppler Indications:     Atrial flibrillation/flutter  History:         Patient has prior history of Echocardiogram examinations, most                  recent 09/26/2020. CHF, CAD, COPD, Arrythmias:Atrial                  Fibrillation; Risk Factors:Hypertension, Diabetes, Dyslipidemia                  and Former Smoker.  Sonographer:     Clayton Lefort RDCS (AE) Referring Phys:  [id number] AMY D CLEGG  Diagnosing Phys: Loralie Champagne MD PROCEDURE: After discussion of the risks and benefits of a TEE, an informed consent was obtained from the patient. The transesophogeal probe was passed without difficulty through the esophogus of the patient. Local oropharyngeal anesthetic was provided with Cetacaine. Sedation performed by different physician. The patient was monitored while under deep sedation. Anesthestetic sedation was provided intravenously by Anesthesiology: 149.81m of Propofol, 1034mof Lidocaine. Image quality was adequate. The  patient developed no complications during the procedure. A successful direct current cardioversion was performed at 200 joules with 1 attempt. IMPRESSIONS  1. Left ventricular ejection fraction, by estimation, is 45 to 50%. The left ventricle has mildly decreased function. The left ventricle demonstrates global hypokinesis. There is mild left ventricular hypertrophy.  2. Right ventricular systolic function is severely reduced. The right ventricular size is severely enlarged. D-shaped interventricular septum suggestive of RV pressure/volume overload.  3. No left atrial/left atrial appendage thrombus was detected.  4. Right atrial size was moderately dilated.  5. The mitral valve is normal in structure. Trivial mitral valve regurgitation. No evidence of mitral stenosis.  6. The aortic valve is tricuspid. Aortic valve regurgitation is mild. No aortic stenosis is present.  7. No PFO or ASD by color doppler.  8. Peak RV-RA gradient 53 mmHg.  9. A small pericardial effusion is present. FINDINGS  Left Ventricle: Left ventricular ejection fraction, by estimation, is 45 to 50%. The left ventricle has mildly decreased function. The left ventricle demonstrates global hypokinesis. The left ventricular internal cavity size was normal in size. There is  mild left ventricular hypertrophy. Right Ventricle: The right ventricular size is severely enlarged. No increase in right ventricular wall  thickness. Right ventricular systolic function is severely reduced. Left Atrium: Left atrial size was normal in size. No left atrial/left atrial appendage thrombus was detected. Right Atrium: Right atrial size was moderately dilated. Pericardium: A small pericardial effusion is present. Mitral Valve: The mitral valve is normal in structure. Trivial mitral valve regurgitation. No evidence of mitral valve stenosis. Tricuspid Valve: The tricuspid valve is normal in structure. Tricuspid valve regurgitation is trivial. Aortic Valve: The aortic valve is tricuspid. Aortic valve regurgitation is mild. No aortic stenosis is present. Pulmonic Valve: The pulmonic valve was normal in structure. Pulmonic valve regurgitation is trivial. No evidence of pulmonic stenosis. Aorta: The aortic root is normal in size and structure. IAS/Shunts: No PFO or ASD by color doppler.  TRICUSPID VALVE TR Peak grad:   52.7 mmHg TR  Vmax:        363.00 cm/s Loralie Champagne MD Electronically signed by Loralie Champagne MD Signature Date/Time: 12/17/2020/3:46:44 PM    Final     EKG:on arrival and this am shows typical appearing flutter in the 70s (personally reviewed)  TELEMETRY: Atrial flutter in 70s (personally reviewed)  Assessment/Plan: 1.  Persistent atrial flutter / atrial fibrillation Pt on eliquis for CHA2DS2VASC of at least 7.   He has "failed" amiodarone in the past and not candidate for tikosyn due to baseline prolonged QT interval Underwent TEE/DCCC for AFL 12/17/20. Back in AFL with controlled rates on admission. Denies any missed eliquis since TEE.  Po amiodarone held on admit due to QTc of "543 ms" but not felt to be accurate with AFL.  Now back on amio 200 mg daily.  He has been previously described as not a (atrial fib) ablation candidate 2/2 severe RAE and at least moderate LAE. He has had continued worsening of his overall condition including RV failure and lung disease. I suspect the likelihood of a successful ablation would  be very low, with the risk being quite high.  Will discuss further with Dr. Rayann Heman.   2. Acute on chronic combined systolic and diastolic CHF With "prominent RV failure" related to COPD/OSA/OHS and h/o poor compliance with home O2 and BiPAP Suspect end stage RV failure.   3. Chronic hypoxic/hypercarbic respiratory On chronic O2 due to ADCHF + COPD/OSA/OHS w/ poor compliance home O2 and BiPAP.   4. Morbid Obesity Body mass index is 30.89 kg/m.   5. Likely prolonged QT EKG this admission with QTc confounded by flutter waves. At baseline QTc is 470-500 going back as far as 2018.  ADDENDUM: Dr. Rayann Heman has seen.  Pt remains a very poor candidate for EP procedures. He has followed with Dr. Caryl Comes in the past, so we can see if he has any additional thoughts tomorrow.   For questions or updates, please contact [place] Please consult [email or web address] for contact info under Cardiology/STEMI.  Signed, Shirley Friar, PA-C  01/08/2021 9:05 AM  I have seen, examined the patient, and reviewed the above assessment and plan.  Changes to above are made where necessary.  On exam, iRRR.  In the setting of severe RA enlargement and prior afib, I do not believe that ablation would offer any substantial long term benefit.  His risks of ablation are also quite high.  I would favor a more conservative approach.  Co Sign: Thompson Grayer, MD 01/08/2021 4:27 PM

## 2021-01-09 DIAGNOSIS — I5043 Acute on chronic combined systolic (congestive) and diastolic (congestive) heart failure: Secondary | ICD-10-CM | POA: Diagnosis not present

## 2021-01-09 LAB — COMPREHENSIVE METABOLIC PANEL
ALT: 11 U/L (ref 0–44)
AST: 18 U/L (ref 15–41)
Albumin: 2.3 g/dL — ABNORMAL LOW (ref 3.5–5.0)
Alkaline Phosphatase: 75 U/L (ref 38–126)
Anion gap: 10 (ref 5–15)
BUN: 41 mg/dL — ABNORMAL HIGH (ref 8–23)
CO2: 33 mmol/L — ABNORMAL HIGH (ref 22–32)
Calcium: 8.6 mg/dL — ABNORMAL LOW (ref 8.9–10.3)
Chloride: 98 mmol/L (ref 98–111)
Creatinine, Ser: 1.91 mg/dL — ABNORMAL HIGH (ref 0.61–1.24)
GFR, Estimated: 37 mL/min — ABNORMAL LOW (ref >60)
Glucose, Bld: 105 mg/dL — ABNORMAL HIGH (ref 70–99)
Potassium: 3.8 mmol/L (ref 3.5–5.1)
Sodium: 141 mmol/L (ref 135–145)
Total Bilirubin: 0.5 mg/dL (ref 0.3–1.2)
Total Protein: 6.1 g/dL — ABNORMAL LOW (ref 6.5–8.1)

## 2021-01-09 LAB — GLUCOSE, CAPILLARY
Glucose-Capillary: 125 mg/dL — ABNORMAL HIGH (ref 70–99)
Glucose-Capillary: 133 mg/dL — ABNORMAL HIGH (ref 70–99)
Glucose-Capillary: 106 mg/dL — ABNORMAL HIGH (ref 70–99)
Glucose-Capillary: 98 mg/dL (ref 70–99)
Glucose-Capillary: 165 mg/dL — ABNORMAL HIGH (ref 70–99)

## 2021-01-09 LAB — PROTIME-INR
INR: 1.5 — ABNORMAL HIGH (ref 0.8–1.2)
Prothrombin Time: 18 seconds — ABNORMAL HIGH (ref 11.4–15.2)

## 2021-01-09 MED ORDER — SODIUM CHLORIDE 0.9 % IV SOLN: INTRAVENOUS | Status: DC

## 2021-01-09 MED ORDER — POTASSIUM CHLORIDE CRYS ER 20 MEQ PO TBCR
20.0000 meq | EXTENDED_RELEASE_TABLET | Freq: Once | ORAL | Status: AC
  Administered 2021-01-09: 20 meq via ORAL
  Filled 2021-01-09: qty 1

## 2021-01-09 NOTE — Progress Notes (Signed)
Visited patients room to help with CPAP.  Patient states he wears sometimes but not wanting to tonight.  RN aware.

## 2021-01-09 NOTE — TOC Initial Note (Addendum)
Transition of Care [facility]) - Initial/Assessment Note    Patient Details  Name: Nicholas Caldwell MRN: [id number] Date of Birth: 05-12-1946  Transition of Care [facility]) CM/SW Contact:    Zenon Mayo, RN Phone Number: 01/09/2021, 4:00 PM  Clinical Narrative:                 Patient from home with wife, he has hospital bed, w/chair, scooter, 3 n 1 at home, NCM offered choice, he states he does not have preference, looks like he is active with [facility]. NCM made referral to [facility] with [facility] for [place], [place], [place], [place].  Patient goes to [place] , CSW is [id number] [phone] ext [phone].  Ramond Marrow states he is not active right now and they can not take him back due to noncompliance.  NCM made referral to [facility] with Alvis Lemmings, he states he can take the referral .  Also Miquel Dunn with HF team will see if he can be set up with para medicine . NCM left vm for Marquita for return call.  Expected Discharge Plan: [place] Barriers to Discharge: Continued Medical Work up   Patient Goals and CMS Choice Patient states their goals for this hospitalization and ongoing recovery are:: go home with [facility] CMS Medicare.gov Compare Post Acute Care list provided to:: Patient Choice offered to / list presented to : Patient  Expected Discharge Plan and Services Expected Discharge Plan: [place]   Discharge Planning Services: CM Consult Post Acute Care Choice: [place] arrangements for the past 2 months: Single Family Home                   DME Agency: NA       HH Arranged: RN,PT,OT,Nurse's Aide,Social Work [employer] Agency: Microbiologist ([facility] home [facility] Date [place]: 01/09/21 Time [place]: 26 Representative spoke with at [place]: [place] Arrangements/Services Living arrangements for the past 2 months: [place] Lives with:: Spouse Patient language and need for interpreter reviewed::  Yes Do you feel safe going back to the place where you live?: Yes      Need for Family Participation in Patient Care: Yes (Comment) Care giver support system in place?: Yes (comment) Current home services: DME (home oxygen with [employer]) Criminal Activity/Legal Involvement Pertinent to Current Situation/Hospitalization: No - Comment as needed  Activities of Daily Living Home Assistive Devices/Equipment: Bedside commode/3-in-1,Oxygen,Cane (specify quad or straight),Walker (specify type),Other (Comment) (scooter)    Permission Sought/Granted                  Emotional Assessment Appearance:: Appears stated age Attitude/Demeanor/Rapport: Engaged Affect (typically observed): Appropriate Orientation: : Oriented to Self,Oriented to Place,Oriented to  Time,Oriented to Situation Alcohol / Substance Use: Not Applicable Psych Involvement: No (comment)  Admission diagnosis:  Acute on chronic systolic CHF (congestive heart failure) ([place]) [I50.23] Patient Active Problem List   Diagnosis Date Noted  . Chronic kidney disease, stage 3b ([place]) 01/06/2021  . Severe sepsis ([place]) 12/11/2020  . Acute renal failure superimposed on stage 4 chronic kidney disease ([place]) 12/11/2020  . Abdominal pain 12/11/2020  . Stage III pressure ulcer of sacral region ([place]) 10/01/2020  . Acute systolic CHF (congestive heart failure) ([place]) 09/25/2020  . Acute respiratory failure with hypercapnia ([place])   . Encounter for intubation   . Pressure injury of skin 08/21/2019  . Cardiac arrest ([place]) 08/20/2019  . Palliative care by specialist   .  DNR (do not resuscitate) discussion   . Fatigue   . Acute CHF (congestive heart failure) ([place]) 07/07/2019  . Acute on chronic heart failure ([place]) 06/09/2019  . Hyperkalemia 04/10/2019  . Cerebral embolism with cerebral infarction 03/21/2019  . Gastritis and gastroduodenitis   . NSVT (nonsustained ventricular tachycardia) ([place]) 03/08/2019  . Tobacco abuse counseling 03/08/2019   . Iron deficiency anemia   . Acute on chronic systolic CHF (congestive heart failure) ([place]) 03/06/2019  . Diabetes mellitus type 2 in obese ([place])   . Primary osteoarthritis of right hip   . Hypomagnesemia   . Steroid-induced hyperglycemia   . Supplemental oxygen dependent   . Leukocytosis   . Hypokalemia   . Acute on chronic anemia   . Diabetic peripheral neuropathy ([place])   . Acute on chronic systolic (congestive) heart failure ([place])   . Acute on chronic respiratory failure ([place]) 01/14/2019  . Hypoxia   . Altered mental status   . RVF (right ventricular failure) ([place]) 12/30/2018  . AKI (acute kidney injury) ([place])   . Acute respiratory failure ([place]) 11/22/2018  . Acute on chronic respiratory failure with hypoxia and hypercapnia ([place]) 11/13/2018  . Metabolic encephalopathy 49/82/6415  . Acute respiratory failure with hypoxia and hypercapnia ([place]) 07/26/2018  . Acute exacerbation of CHF (congestive heart failure) ([place]) 07/26/2018  . CHF exacerbation ([place]) 05/01/2018  . CHF (congestive heart failure) ([place]) 04/30/2018  . Chronic respiratory failure with hypoxia ([place]) 12/28/2017  . Acute metabolic encephalopathy   . Atrial fibrillation with RVR ([place])   . SVT (supraventricular tachycardia) ([place])   . COPD GOLD 0   . Medically noncompliant   . Panlobular emphysema ([place])   . OSA (obstructive sleep apnea)   . Pulmonary hypertension ([place])   . Disorientation   . Pressure injury of skin 09/07/2016  . Acute on chronic respiratory failure with hypoxia ([place]) 09/05/2016  . Acute on chronic combined systolic and diastolic CHF (congestive heart failure) ([place]) 09/05/2016  . Skin lesion-left heal 09/05/2016  . Chronic systolic CHF (congestive heart failure) ([place]) 07/16/2016  . COPD (chronic obstructive pulmonary disease) ([place]) 07/16/2016  . Uncontrolled type 2 diabetes mellitus with complication ([place])   . Diabetic polyneuropathy associated with diabetes mellitus due to underlying condition ([place])    . Normocytic anemia 06/29/2016  . CAD - Non-obstructive by LHC1/16 01/21/2016  . Prolonged QT interval 10/09/2015  . Nonischemic cardiomyopathy ([place]) 10/09/2015  . PAF (paroxysmal atrial fibrillation) ([place])   . Chronic pain 02/19/2015  . Morbid obesity ([place]) 02/13/2015  . Dyspnea   . Elevated troponin I level 11/01/2014  . COPD exacerbation ([place]) 11/01/2014  . Essential hypertension 10/31/2014  . Type 2 diabetes mellitus with neuropathy 10/31/2014  . Hyperlipidemia  10/31/2014  . Cigarette smoker 10/31/2014   PCP:  [name], MD Pharmacy:   [facility] [place], [place] [address] [place] [address] Phone: [phone] Fax: [place], [place] - [place] [address] [place] [address] Phone: [phone] Fax: [phone]  [facility] [place], [place] - [address] [place] [address] Phone: [phone] Fax: [phone]     Social Determinants of Health (SDOH) Interventions Food Insecurity Interventions: Intervention Not Indicated Financial Strain Interventions: Intervention Not Indicated Housing Interventions: Intervention Not Indicated Transportation Interventions: Intervention Not Indicated  Readmission Risk Interventions Readmission  Risk Prevention Plan 01/09/2021 11/27/2020 11/23/2020  Transportation Screening Complete - Complete  PCP or Specialist Appt within 3-5 Days - Complete -  HRI or [place] - Complete -  Social Work Consult for [place] Planning/Counseling - Complete -  Palliative Care Screening - Not Applicable -  Medication Review Press photographer) Complete Complete Complete  Med Review Comments - - -  PCP or Specialist appointment within 3-5 days of discharge - - -  PCP/Specialist Appt Not Complete  comments - - -  [place] or Home Care Consult Complete - Complete  SW Recovery Care/Counseling Consult Complete - Complete  Palliative Care Screening Not Applicable - Not [place] Patient Refused - Not Applicable  Some recent data might be hidden

## 2021-01-09 NOTE — Progress Notes (Addendum)
Advanced Heart Failure Rounding Note  PCP-Cardiologist: No primary care provider on file.   Subjective:    - 1.5L in UOP yesterday. Wt down an additional 4 lb. Overall has diuresed 10 lb.  Scr down from admit and stable, 2.11>>1.92>>1.91  K 3.3   Remains in rate controlled flutter. Evaluated by EP, not a candidate for ablation.    Objective:   Weight Range: 101.5 kg Body mass index is 30.35 kg/m.   Vital Signs:   Temp:  [98.1 F (36.7 C)-98.9 F (37.2 C)] 98.3 F (36.8 C) (03/09 0300) Pulse Rate:  [71-74] 72 (03/09 0300) Resp:  [15-25] 20 (03/09 0300) BP: (89-122)/(53-70) 113/55 (03/09 0300) SpO2:  [89 %-97 %] 95 % (03/09 0300) Weight:  [101.5 kg-103.3 kg] 101.5 kg (03/09 0402) Last BM Date: 01/08/21  Weight change: Filed Weights   01/08/21 0715 01/08/21 0743 01/09/21 0402  Weight: 103.3 kg 103.3 kg 101.5 kg    Intake/Output:   Intake/Output Summary (Last 24 hours) at 01/09/2021 0742 Last data filed at 01/09/2021 0400 Gross per 24 hour  Intake 720 ml  Output 1450 ml  Net -730 ml      Physical Exam   General:  Elderly male . No respiratory difficulty HEENT: normal Neck: supple. no JVD. Carotids 2+ bilat; no bruits. No lymphadenopathy or thyromegaly appreciated. Cor: PMI nondisplaced. Irregular rhythm. No rubs, gallops or murmurs. Lungs: clear Abdomen: soft, nontender, nondistended. No hepatosplenomegaly. No bruits or masses. Good bowel sounds. Extremities: no cyanosis, clubbing, rash, edema Neuro: alert & oriented x 3, cranial nerves grossly intact. moves all 4 extremities w/o difficulty. Affect pleasant.   Telemetry   Atrial flutter 40s   EKG   No new EKG to review   Labs    CBC Recent Labs    01/07/21 0057 01/08/21 0032  WBC 5.3 5.9  HGB 8.5* 8.7*  HCT 29.1* 28.3*  MCV 86.6 85.2  PLT 460* 557*   Basic Metabolic Panel Recent Labs    01/08/21 0032 01/09/21 0119  NA 140 141  K 3.8 3.8  CL 98 98  CO2 33* 33*  GLUCOSE 106* 105*   BUN 41* 41*  CREATININE 1.92* 1.91*  CALCIUM 8.6* 8.6*   Liver Function Tests Recent Labs    01/09/21 0119  AST 18  ALT 11  ALKPHOS 75  BILITOT 0.5  PROT 6.1*  ALBUMIN 2.3*   No results for input(s): LIPASE, AMYLASE in the last 72 hours. Cardiac Enzymes No results for input(s): CKTOTAL, CKMB, CKMBINDEX, TROPONINI in the last 72 hours.  BNP: BNP (last 3 results) Recent Labs    11/21/20 1944 12/11/20 0730 01/05/21 2213  BNP 2,475.4* 1,858.3* 3,522.2*    ProBNP (last 3 results) No results for input(s): PROBNP in the last 8760 hours.   D-Dimer No results for input(s): DDIMER in the last 72 hours. Hemoglobin A1C No results for input(s): HGBA1C in the last 72 hours. Fasting Lipid Panel No results for input(s): CHOL, HDL, LDLCALC, TRIG, CHOLHDL, LDLDIRECT in the last 72 hours. Thyroid Function Tests No results for input(s): TSH, T4TOTAL, T3FREE, THYROIDAB in the last 72 hours.  Invalid input(s): FREET3  Other results:   Imaging    No results found.   Medications:     Scheduled Medications: . amiodarone  200 mg Oral Daily  . apixaban  5 mg Oral BID  . atorvastatin  40 mg Oral QHS  . feeding supplement (GLUCERNA SHAKE)  237 mL Oral TID BM  . ferrous sulfate  325 mg Oral Q M,W,F-1800  . fluticasone  1 spray Each Nare Daily  . furosemide  80 mg Intravenous BID  . Gerhardt's butt cream  1 application Topical BID  . insulin aspart  0-9 Units Subcutaneous TID WC  . mometasone-formoterol  1 puff Inhalation BID   And  . umeclidinium bromide  1 puff Inhalation Daily  . potassium chloride SA  20 mEq Oral BID  . tamsulosin  0.4 mg Oral QPC breakfast  . torsemide  80 mg Oral BID    Infusions: . sodium chloride      PRN Medications: sodium chloride, acetaminophen, famotidine, fluticasone, ipratropium-albuterol, LORazepam, oxybutynin, oxyCODONE-acetaminophen **AND** oxyCODONE    Assessment/Plan  1. Acute on Chronic Combined Systolic + Diastolic Heart  Failure w/ Prominent RV Failure: Likely related to COPD/OSA/OHS.H/o poor compliance w/ home O2 and BiPAP.Echo in 11/21 withEF 50-55%, RV severely reduced w/ severely elevated RV systolic pressure, 68 mmHg.TEE 12/2020 LVEF mildly reduced 45-50%. RV severely reduced. Admitted for ADHF. BNP higher from prior baseline, 3,522. CXR w/ moderate left pleural effusion + mild pulmonary edema - Suspect recurrent AFL contributing. Plan repeat attempt at DCCV after diuresis  - Volume status improving. Overall has diuresed 10 lb    - Start torsemide 80 mg bid today   - No spiro/dig/arni with elevated creatinine.  - Would not use SGT2i due to chronic groin/skin infection.  - Renal function stable.  2. Atrial Fibrillation/ Flutter: Paroxysmal. He has history of CVA. He had DCCV in 3/20 and again in 5/20. He failed amiodarone in the past and cannot take Tikosyn due to baseline prolonged QT interval. Underwent recent TEE/DCCV for AFL 12/17/20. He was back in Atrial flutter with controlled rate.  PO amiodarone held on admit due to pronged QTc 543 ms but was in A flutter QTc not accurate.  - Remains in persistent A flutter.  - Amio 200 mg restarted per Dr [name].  - Continue Eliquis 5 mg bid  - EP has evaluated. Not a candidate for AFL Ablation  - reports full compliance w/ Eliquis. Plan repeat attempt at [place] prior to d/c - needs to improve compliance w/ home BiPAP  3. ChronicHypoxic/Hypercarbic Respiratory failure: Due toADCHF + COPD/OSA/OHS w/ poor compliance w/ home O2 and BiPAP. 4. Stage III CKD: new baseline ~2.2 - stable at 1.9 today. Follow w/ diuresis  5. COPD:No longer smokes. Continue homeoxygen.  - No wheeze on exam. 6.OHS/OSA:Continue oxygen during the day andBIPAP at night.  7.CAD:Nonobstructive disease in 2/20. No chest pain.  - no ASA due to anticoagulation. -Atorvastatin.   Length of Stay: [address] PA-C  01/09/2021, 7:42 AM  Advanced Heart Failure Team Pager  [phone] (M-F; 7a - 5p)  Please contact [facility] for night-coverage after hours (4p -7a ) and weekends on [email or web address]  Patient seen with PA, agree with the above note.   He diuresed reasonably yesterday with IV Lasix, weight down about 10 lbs total.  Creatinine stable at 1.91.  He remains in atrial flutter, not ablation candidate per EP consult.   Refused PT yesterday.   General: NAD Neck: No JVD, no thyromegaly or thyroid nodule.  Lungs: Clear to auscultation bilaterally with normal respiratory effort. CV: Nondisplaced PMI.  Heart regular S1/S2, no S3/S4, no murmur.  No peripheral edema.   Abdomen: Soft, nontender, no hepatosplenomegaly, no distention.  Skin: Intact without lesions or rashes.  Neurologic: Alert and oriented x 3.  Psych: Normal affect. Extremities: No clubbing or cyanosis.  HEENT:  Normal.   Volume status better, transition to torsemide 80 mg bid today.   Will see if we can arrange for DCCV tomorrow.  No TEE, has not missed apixaban since last DCCV.    Refuses PT, consistently refuses any form of home health, paramedicine.  Uses ER as PCP and very rarely comes to outpatient appts.  Unfortunately, very difficult situation with high risk of readmission.   Loralie Champagne 01/09/2021 8:12 AM

## 2021-01-09 NOTE — Progress Notes (Signed)
PROGRESS NOTE   Nicholas Caldwell  ION:[id number] DOB: [birth year] DOA: 01/05/2021 PCP: [name], MD  Brief Narrative:  75 year old community dwelling black male COPD OSA on BiPAP Chronic systolic heart failure 2/2 and ICM + RV failure + severe pulmonary HTN Nonobstructive CAD on cath 12/2018 PEA arrest 08/2019?  Narcotic over use and under use of right  IDDM P A. fib/flutter + DCCV 12/17/2020 Dr. Algernon Huxley (cannot tolerate Tikosyn)-failed amiodarone Patient has been hospitalized 5 times in the last 6 months-he tells me that every time he gains over 3 pounds he has been instructed by cardiology to come to the hospital for IV diuretics  Return [place] 3/6 AM --SOB PND +10 pounds weight gain-baseline O2 rec = now 6 L up from 3  -Afebrile tachypneic incomplete RBBB with prolonged QTC CXR = CHF BNP 3522 troponin 33 Started on Lasix IV 80  Heart failure saw the team in consult and report that he has been very difficult to engage with prior medicine and that is the reason why he was readmitted so often Not a candidate for flutter ablation  Hospital-Problem based course  Acute decompensated multifactorial heart failure (severe pulmonary HTN right-sided failure) ATN superimposed on 3B versus cardiorenal syndrome P A. fib CHADS2 score >4 on Eliquis              Defer to cardiology medications-currently is -3.5 L weight down 104-->101 Flutter ablation contemplated --conservative approach only as per EP Dr. Rayann Heman Patient developing mild alkalosis -considering Diamox? Bilateral lower extremity pain and weakness that was sudden onset             Chronic 4 month lower extremity pain right side Dr. Parke Simmers evaluated 3/7-follow SPEP immunofixation methylmalonic   --Neurology feels multifactorial and will need outpatient work-up at the [facility] IDDM A1c 6.8-sliding scale coverage at this time  CBG 106-133 eating 75%  meals  Resume 70/30 insulin at 10 units twice daily on discharge Dilutional anemia-history  hemorrhoids  Iron studies performed = iron deficiency  Nulecit given 3/7-resumed oral tid FeSO4  Outpatient colonoscopy? acute hypoxic respiratory failure as above OPD OSA Resume DuoNeb every 6 as needed, Incruse Ellipta 1 puff daily Sacral decubitus from prior to admission  Placing Gerhart cream and turn frequently     DVT prophylaxis: Eliquis Code Status: Full Family Communication: Long discussion with wife on 3/8 at bedside Jarred Purtee [phone] There appears to be some disconnect-the patient states he does not want to go to rehab facility and absolutely refuses to go He seems to have some misunderstanding regarding what the hospital at home program offers  He remains high risk for readmission to the hospital despite multiple explanations Disposition:  Status is: Inpatient  Remains inpatient appropriate because:Hemodynamically unstable, Persistent severe electrolyte disturbances, Altered mental status and Ongoing diagnostic testing needed not appropriate for outpatient work up   Dispo: The patient is from: Home              Anticipated d/c is to: Home he has been recommended skilled several times and refuses              Patient currently is not medically stable to d/c.   Difficult to place patient No  Consultants:   Cardiology  Procedures:   Antimicrobials: None   Subjective:  Quite sleepy today Feels overall better with the breathing No chest pain  Objective: Vitals:   01/09/21 0810 01/09/21 0954 01/09/21 1031 01/09/21 1114  BP: 117/61   (!) 94/48  Pulse: 72  72 72 71  Resp: 19 (!) _0 Temp:    98.5 F (36.9 C)  TempSrc:    Oral  SpO2: 93% 91% 91% 90%  Weight:        Intake/Output Summary (Last 24 hours) at 01/09/2021 1603 Last data filed at 01/09/2021 0400 Gross per 24 hour  Intake 240 ml  Output 1450 ml  Net -1210 ml   Filed Weights   01/08/21 0715 01/08/21 0743 01/09/21 0402  Weight: 103.3 kg 103.3 kg 101.5 kg     Examination:  Coherent no distress JVD improved from prior S1-S2 a flutter with PVCs on monitor Abdomen soft no rebound no guarding Power grossly normal in upper extremities did not examine no rigidity   Data Reviewed: personally reviewed   Data  sodium 141 BUN/creatinine 47/2.11-->41/1.9 CO2 33 Albumin 2.3  Radiology Studies: No results found.   Scheduled Meds: . amiodarone  200 mg Oral Daily  . apixaban  5 mg Oral BID  . atorvastatin  40 mg Oral QHS  . feeding supplement (GLUCERNA SHAKE)  237 mL Oral TID BM  . ferrous sulfate  325 mg Oral Q M,W,F-1800  . fluticasone  1 spray Each Nare Daily  . furosemide  80 mg Intravenous BID  . Gerhardt's butt cream  1 application Topical BID  . insulin aspart  0-9 Units Subcutaneous TID WC  . mometasone-formoterol  1 puff Inhalation BID   And  . umeclidinium bromide  1 puff Inhalation Daily  . potassium chloride SA  20 mEq Oral BID  . tamsulosin  0.4 mg Oral QPC breakfast  . torsemide  80 mg Oral BID   Continuous Infusions: . sodium chloride       LOS: 3 days   Time spent: 15  Nita Sells, MD [facility] To contact the attending provider between 7A-7P or the covering provider during after hours 7P-7A, please log into the web site [email or web address] and access using universal [place] password for that web site. If you do not have the password, please call the hospital operator.  01/09/2021, 4:03 PM

## 2021-01-09 NOTE — Progress Notes (Signed)
Occupational Therapy Treatment Patient Details Name: Nicholas Caldwell MRN: [id number] DOB: 07/21/1946 Today's Date: 01/09/2021    History of present illness Pt is a 75 y/o male admitted 3/5 secondary to worsening LE edema and SOB. Thought to be secondary to CHF exacerbation. Pt with 8 months of bil LE weakness with some Rt foot drop. PMH includes a flutter, CAD, CHF, COPD, DM, obesity, NICM and HTN.   OT comments  Pt difficult to motivate today and declining OOB activity. Spouse in room playing a crossword game and did not encourage pt to get OOB or work with [place]. Pt education on need to get OOB daily. Pt sat EOB for light grooming and scooting toward HOB, but would not mobilize beyond that. Pt still has difficulty maneuvering BLEs onto bed. Pt's O2 >90% on 2 LO2; BP stable 105/62 sitting upright; Pt wanting pain medicine, but would not specify where pain was and per RN it was given less than 2 hours ago. Pt would benefit from continued OT skilled services as long as pt is willing to participate. OT following.    Follow Up Recommendations  Home health OT;Supervision/Assistance - 24 hour (Pt refusing SNF)    Equipment Recommendations  Wheelchair (measurements OT);Wheelchair cushion (measurements OT) (may need W/C for poor mobility and weakness in BLEs)    Recommendations for Other Services      Precautions / Restrictions Precautions Precautions: Fall Restrictions Weight Bearing Restrictions: No       Mobility Bed Mobility Overal bed mobility: Needs Assistance Bed Mobility: Supine to Sit;Sit to Supine     Supine to sit: Mod assist Sit to supine: Mod assist   General bed mobility comments: mod assist for LEs and trunk to EOB, mod assist for LEs back into bed    Transfers                 General transfer comment: pt declined    Balance Overall balance assessment: Needs assistance Sitting-balance support: No upper extremity supported Sitting balance-Leahy Scale: Fair          Standing balance comment: declined                           ADL either performed or assessed with clinical judgement   ADL Overall ADL's : Needs assistance/impaired     Grooming: Wash/dry hands;Wash/dry face;Sitting;Set up;Brushing hair Grooming Details (indicate cue type and reason): sitting EOB x5 mins                             Functional mobility during ADLs: Supervision/safety (scooting towards HOB; pt declined sit to stand) General ADL Comments: Pt difficult to motivate today and declining OOB activity. Spouse in room playing a crossword game and did not encourage pt to get OOB or work with [place]. Pt education on need to get OOB daily. Pt sat EOB for light grooming and scooting toward HOB, but would not mobilize beyond that. Pt still has difficulty maneuvering BLEs onto bed.     Vision   Vision Assessment?: No apparent visual deficits   Perception     Praxis      Cognition Arousal/Alertness: Lethargic Behavior During Therapy: Flat affect Overall Cognitive Status: History of cognitive impairments - at baseline  General Comments: Pt with  poor insight into deficits and spouse in room was not encouraging for pt to mobilize. (likely baseline)        Exercises     Shoulder Instructions       General Comments O2 >90% on 2 LO2; BP stable 105/62 sitting upright; Pt wanting pain medicine, but would not specify where pain was and it was given 2 hours ago and required 4 more hours until next dose.    Pertinent Vitals/ Pain       Pain Assessment: No/denies pain Pain Intervention(s): Limited activity within patient's tolerance  Home Living                                          Prior Functioning/Environment              Frequency  Min 2X/week        Progress Toward Goals  OT Goals(current goals can now be found in the care plan section)  Progress towards OT goals:  Not progressing toward goals - comment (Pt performing very little today and unable to express why)  Acute Rehab OT Goals Patient Stated Goal: to feel better OT Goal Formulation: With patient Time For Goal Achievement: 01/21/21 Potential to Achieve Goals: Fair ADL Goals Pt Will Perform Grooming: with supervision;with set-up;sitting Pt Will Perform Upper Body Bathing: with supervision;with set-up;sitting Pt Will Perform Upper Body Dressing: with supervision;with set-up;sitting Pt Will Transfer to Toilet: with min guard assist;stand pivot transfer;bedside commode Pt Will Perform Toileting - Clothing Manipulation and hygiene: with mod assist;with min assist;sit to/from stand Additional ADL Goal #1: Pt will perform bed mobility with min assist in preparation for ADL.  Plan Discharge plan remains appropriate    Co-evaluation                 AM-PAC OT "6 Clicks" Daily Activity     Outcome Measure   Help from another person eating meals?: None Help from another person taking care of personal grooming?: A Little Help from another person toileting, which includes using toliet, bedpan, or urinal?: Total Help from another person bathing (including washing, rinsing, drying)?: A Lot Help from another person to put on and taking off regular upper body clothing?: A Little Help from another person to put on and taking off regular lower body clothing?: Total 6 Click Score: 14    End of Session Equipment Utilized During Treatment: Oxygen  OT Visit Diagnosis: Unsteadiness on feet (R26.81);Other abnormalities of gait and mobility (R26.89);Muscle weakness (generalized) (M62.81);Pain Pain - Right/Left: Right Pain - part of body: Hip   Activity Tolerance Patient limited by fatigue;Other (comment) (self limiting)   Patient Left in bed;with call bell/phone within reach;with bed alarm set;with family/visitor present   Nurse Communication Mobility status        Time: 1140-1200 OT Time  Calculation (min): 20 min  Charges: OT General Charges $OT Visit: 1 Visit OT Treatments $Self Care/Home Management : 8-22 mins  Jefferey Pica, OTR/L Acute Rehabilitation Services Pager: [phone] Office: [phone]   ALLISON C 01/09/2021, 5:38 PM

## 2021-01-10 ENCOUNTER — Encounter: Payer: Self-pay | Admitting: Neurology

## 2021-01-10 ENCOUNTER — Inpatient Hospital Stay (HOSPITAL_COMMUNITY): Payer: No Typology Code available for payment source | Admitting: Certified Registered"

## 2021-01-10 ENCOUNTER — Encounter (HOSPITAL_COMMUNITY)
Admission: EM | Disposition: A | Discharge status: Home Health | Payer: Self-pay | Source: Home / Self Care | Attending: Family Medicine

## 2021-01-10 ENCOUNTER — Encounter (HOSPITAL_COMMUNITY): Payer: Self-pay | Admitting: Family Medicine

## 2021-01-10 DIAGNOSIS — I4892 Unspecified atrial flutter: Secondary | ICD-10-CM | POA: Diagnosis not present

## 2021-01-10 DIAGNOSIS — I5043 Acute on chronic combined systolic (congestive) and diastolic (congestive) heart failure: Secondary | ICD-10-CM | POA: Diagnosis not present

## 2021-01-10 HISTORY — PX: CARDIOVERSION: SHX1299

## 2021-01-10 LAB — PROTEIN ELECTROPHORESIS, SERUM
A/G Ratio: 0.8 (ref 0.7–1.7)
Albumin ELP: 2.6 g/dL — ABNORMAL LOW (ref 2.9–4.4)
Alpha-1-Globulin: 0.3 g/dL (ref 0.0–0.4)
Alpha-2-Globulin: 0.7 g/dL (ref 0.4–1.0)
Beta Globulin: 1 g/dL (ref 0.7–1.3)
Gamma Globulin: 1.2 g/dL (ref 0.4–1.8)
Globulin, Total: 3.1 g/dL (ref 2.2–3.9)
M-Spike, %: 0.3 g/dL — ABNORMAL HIGH
Total Protein ELP: 5.7 g/dL — ABNORMAL LOW (ref 6.0–8.5)

## 2021-01-10 LAB — IMMUNOFIXATION ELECTROPHORESIS
IgA: 767 mg/dL — ABNORMAL HIGH (ref 61–437)
IgG (Immunoglobin G), Serum: 1017 mg/dL (ref 603–1613)
IgM (Immunoglobulin M), Srm: 73 mg/dL (ref 15–143)
Total Protein ELP: 5.6 g/dL — ABNORMAL LOW (ref 6.0–8.5)

## 2021-01-10 LAB — UPEP/UIFE/LIGHT CHAINS/TP, 24-HR UR
% BETA, Urine: 47.3 %
ALPHA 1 URINE: 10 %
Albumin, U: 9.3 %
Alpha 2, Urine: 14.5 %
Free Kappa Lt Chains,Ur: 143.57 mg/L — ABNORMAL HIGH (ref 0.63–113.79)
Free Kappa/Lambda Ratio: 4.27 (ref 1.03–31.76)
Free Lambda Lt Chains,Ur: 33.64 mg/L — ABNORMAL HIGH (ref 0.47–11.77)
GAMMA GLOBULIN URINE: 18.9 %
Total Protein, Urine-Ur/day: 317 mg/24 hr — ABNORMAL HIGH (ref 30–150)
Total Protein, Urine: 13.2 mg/dL
Total Volume: 2400

## 2021-01-10 LAB — CBC WITH DIFFERENTIAL/PLATELET
Abs Immature Granulocytes: 0.03 10*3/uL (ref 0.00–0.07)
Basophils Absolute: 0.1 10*3/uL (ref 0.0–0.1)
Basophils Relative: 1 %
Eosinophils Absolute: 0.1 10*3/uL (ref 0.0–0.5)
Eosinophils Relative: 2 %
HCT: 28.5 % — ABNORMAL LOW (ref 39.0–52.0)
Hemoglobin: 8.9 g/dL — ABNORMAL LOW (ref 13.0–17.0)
Immature Granulocytes: 1 %
Lymphocytes Relative: 15 %
Lymphs Abs: 0.7 10*3/uL (ref 0.7–4.0)
MCH: 26.4 pg (ref 26.0–34.0)
MCHC: 31.2 g/dL (ref 30.0–36.0)
MCV: 84.6 fL (ref 80.0–100.0)
Monocytes Absolute: 0.8 10*3/uL (ref 0.1–1.0)
Monocytes Relative: 17 %
Neutro Abs: 3 10*3/uL (ref 1.7–7.7)
Neutrophils Relative %: 64 %
Platelets: 399 10*3/uL (ref 150–400)
RBC: 3.37 MIL/uL — ABNORMAL LOW (ref 4.22–5.81)
RDW: 19.1 % — ABNORMAL HIGH (ref 11.5–15.5)
WBC: 4.6 10*3/uL (ref 4.0–10.5)
nRBC: 0 % (ref 0.0–0.2)

## 2021-01-10 LAB — BASIC METABOLIC PANEL
Anion gap: 7 (ref 5–15)
BUN: 40 mg/dL — ABNORMAL HIGH (ref 8–23)
CO2: 38 mmol/L — ABNORMAL HIGH (ref 22–32)
Calcium: 8.6 mg/dL — ABNORMAL LOW (ref 8.9–10.3)
Chloride: 95 mmol/L — ABNORMAL LOW (ref 98–111)
Creatinine, Ser: 2.11 mg/dL — ABNORMAL HIGH (ref 0.61–1.24)
GFR, Estimated: 33 mL/min — ABNORMAL LOW (ref >60)
Glucose, Bld: 118 mg/dL — ABNORMAL HIGH (ref 70–99)
Potassium: 3.7 mmol/L (ref 3.5–5.1)
Sodium: 140 mmol/L (ref 135–145)

## 2021-01-10 LAB — GLUCOSE, CAPILLARY: Glucose-Capillary: 126 mg/dL — ABNORMAL HIGH (ref 70–99)

## 2021-01-10 SURGERY — CARDIOVERSION
Anesthesia: General

## 2021-01-10 MED ORDER — LIDOCAINE 2% (20 MG/ML) 5 ML SYRINGE
INTRAMUSCULAR | Status: DC | PRN
  Administered 2021-01-10: 80 mg via INTRAVENOUS

## 2021-01-10 MED ORDER — TORSEMIDE 20 MG PO TABS: 80.0000 mg | ORAL_TABLET | Freq: Two times a day (BID) | ORAL | Status: DC

## 2021-01-10 MED ORDER — GERHARDT'S BUTT CREAM: 1.0000 "application " | TOPICAL_CREAM | Freq: Two times a day (BID) | CUTANEOUS | 0 refills | Status: AC

## 2021-01-10 MED ORDER — POTASSIUM CHLORIDE CRYS ER 20 MEQ PO TBCR
20.0000 meq | EXTENDED_RELEASE_TABLET | ORAL | Status: AC
  Administered 2021-01-10: 20 meq via ORAL

## 2021-01-10 MED ORDER — PROPOFOL 10 MG/ML IV BOLUS
INTRAVENOUS | Status: DC | PRN
  Administered 2021-01-10: 50 mg via INTRAVENOUS

## 2021-01-10 MED ORDER — POTASSIUM CHLORIDE CRYS ER 20 MEQ PO TBCR: 20.0000 meq | EXTENDED_RELEASE_TABLET | Freq: Two times a day (BID) | ORAL | 0 refills | Status: DC

## 2021-01-10 MED ORDER — AMIODARONE HCL 200 MG PO TABS: 200.0000 mg | ORAL_TABLET | Freq: Every day | ORAL | 0 refills | Status: DC

## 2021-01-10 MED ORDER — TORSEMIDE 40 MG PO TABS: 80.0000 mg | ORAL_TABLET | Freq: Two times a day (BID) | ORAL | 3 refills | Status: DC

## 2021-01-10 NOTE — Interval H&P Note (Signed)
History and Physical Interval Note:  01/10/2021 9:39 AM  Nicholas Caldwell  has presented today for surgery, with the diagnosis of afib.  The various methods of treatment have been discussed with the patient and family. After consideration of risks, benefits and other options for treatment, the patient has consented to  Procedure(s): CARDIOVERSION (N/A) as a surgical intervention.  The patient's history has been reviewed, patient examined, no change in status, stable for surgery.  I have reviewed the patient's chart and labs.  Questions were answered to the patient's satisfaction.     [name]

## 2021-01-10 NOTE — Anesthesia Postprocedure Evaluation (Signed)
Anesthesia Post Note  Patient: Nicholas Caldwell  Procedure(s) Performed: CARDIOVERSION (N/A )     Patient location during evaluation: PACU Anesthesia Type: General Level of consciousness: awake Pain management: pain level controlled Vital Signs Assessment: post-procedure vital signs reviewed and stable Respiratory status: spontaneous breathing and respiratory function stable Cardiovascular status: stable Postop Assessment: no apparent nausea or vomiting Anesthetic complications: no   No complications documented.  Last Vitals:  Vitals:   01/10/21 0920 01/10/21 0947  BP: (!) 121/54 120/64  Pulse: 73 86  Resp: (!) 25 20  Temp: 37.1 C   SpO2: 92% 91%    Last Pain:  Vitals:   01/10/21 0947  TempSrc:   PainSc: 0-No pain                 Merlinda Frederick

## 2021-01-10 NOTE — Progress Notes (Signed)
All set for discharge, discharge instructions given to wife. Awaiting delivery of oxygen tank from [employer].

## 2021-01-10 NOTE — TOC Transition Note (Addendum)
Transition of Care [facility]) - CM/SW Discharge Note   Patient Details  Name: Nicholas Caldwell MRN: [id number] Date of Birth: 10-Sep-1946  [facility] CM/SW Contact:  Zenon Mayo, RN Phone Number: 01/10/2021, 12:46 PM   Clinical Narrative:    Patient is for dc today, NCM notified Tommi Rumps with [facility],  [place] also notified Zach with [employer] to bring oxygen tank up to room so patient can go home with it.  He already has oxygen thru [employer] per wife. NCM faxed the dc summary , orders, demographics to PCP at [facility], left Marquita message to return call.   Final next level of care: [place] Barriers to Discharge: No Barriers Identified   Patient Goals and CMS Choice Patient states their goals for this hospitalization and ongoing recovery are:: home with [facility] CMS Medicare.gov Compare Post Acute Care list provided to:: Patient Choice offered to / list presented to : Patient  Discharge Placement                       Discharge Plan and Services   Discharge Planning Services: CM Consult Post Acute Care Choice: Home Health            DME Agency: NA       HH Arranged: RN,PT,OT,Nurse's Aide,Social Work [employer] Agency: [place] Date [place]: 01/09/21 Time [place]: 1559 Representative spoke with at [place]: Tommi Rumps  Social Determinants of Health (SDOH) Interventions Food Insecurity Interventions: Intervention Not Indicated Financial Strain Interventions: Intervention Not Indicated Housing Interventions: Intervention Not Indicated Transportation Interventions: Intervention Not Indicated   Readmission Risk Interventions Readmission Risk Prevention Plan 01/09/2021 11/27/2020 11/23/2020  Transportation Screening Complete - Complete  PCP or Specialist Appt within 3-5 Days - Complete -  HRI or [place] - Complete -  Social Work Consult for [place] Planning/Counseling - Complete -  Palliative Care Screening - Not  Applicable -  Medication Review Press photographer) Complete Complete Complete  Med Review Comments - - -  PCP or Specialist appointment within 3-5 days of discharge - - -  PCP/Specialist Appt Not Complete comments - - -  [place] or [place] Complete - Complete  SW Recovery Care/Counseling Consult Complete - Complete  Palliative Care Screening Not Applicable - Not [place] Patient Refused - Not Applicable  Some recent data might be hidden

## 2021-01-10 NOTE — H&P (View-Only) (Signed)
Patient ID: Nicholas Caldwell, male   DOB: November 21, 1945, 75 y.o.   MRN: [id number]     Advanced Heart Failure Rounding Note  PCP-Cardiologist: No primary care provider on file.   Subjective:    Patient got both IV Lasix and po torsemide.   Scr down from admit and stable, 2.11>>1.92>>1.91>>2.11  Remains in rate-controlled flutter. Evaluated by EP, not a candidate for ablation.    Objective:   Weight Range: 102 kg Body mass index is 30.5 kg/m.   Vital Signs:   Temp:  [98.5 F (36.9 C)-98.8 F (37.1 C)] 98.5 F (36.9 C) (03/10 0300) Pulse Rate:  [69-75] 72 (03/10 0600) Resp:  [16-26] 19 (03/10 0600) BP: (94-117)/(46-65) 98/46 (03/10 0600) SpO2:  [90 %-96 %] 94 % (03/10 0600) Weight:  [102 kg] 102 kg (03/10 0500) Last BM Date: 01/08/21  Weight change: Filed Weights   01/08/21 0743 01/09/21 0402 01/10/21 0500  Weight: 103.3 kg 101.5 kg 102 kg    Intake/Output:   Intake/Output Summary (Last 24 hours) at 01/10/2021 0755 Last data filed at 01/10/2021 0604 Gross per 24 hour  Intake 100 ml  Output 2150 ml  Net -2050 ml      Physical Exam   General: NAD Neck: No JVD, no thyromegaly or thyroid nodule.  Lungs: Clear to auscultation bilaterally with normal respiratory effort. CV: Nondisplaced PMI.  Heart irregular S1/S2, no S3/S4, no murmur.  No peripheral edema.   Abdomen: Soft, nontender, no hepatosplenomegaly, no distention.  Skin: Intact without lesions or rashes.  Neurologic: Alert and oriented x 3.  Psych: Normal affect. Extremities: No clubbing or cyanosis.  HEENT: Normal.    Telemetry   Atrial flutter 70s (personally reviewed).   EKG   No new EKG to review   Labs    CBC Recent Labs    01/08/21 0032 01/10/21 0032  WBC 5.9 4.6  NEUTROABS  --  3.0  HGB 8.7* 8.9*  HCT 28.3* 28.5*  MCV 85.2 84.6  PLT 472* 578   Basic Metabolic Panel Recent Labs    01/09/21 0119 01/10/21 0032  NA 141 140  K 3.8 3.7  CL 98 95*  CO2 33* 38*  GLUCOSE 105* 118*   BUN 41* 40*  CREATININE 1.91* 2.11*  CALCIUM 8.6* 8.6*   Liver Function Tests Recent Labs    01/09/21 0119  AST 18  ALT 11  ALKPHOS 75  BILITOT 0.5  PROT 6.1*  ALBUMIN 2.3*   No results for input(s): LIPASE, AMYLASE in the last 72 hours. Cardiac Enzymes No results for input(s): CKTOTAL, CKMB, CKMBINDEX, TROPONINI in the last 72 hours.  BNP: BNP (last 3 results) Recent Labs    11/21/20 1944 12/11/20 0730 01/05/21 2213  BNP 2,475.4* 1,858.3* 3,522.2*    ProBNP (last 3 results) No results for input(s): PROBNP in the last 8760 hours.   D-Dimer No results for input(s): DDIMER in the last 72 hours. Hemoglobin A1C No results for input(s): HGBA1C in the last 72 hours. Fasting Lipid Panel No results for input(s): CHOL, HDL, LDLCALC, TRIG, CHOLHDL, LDLDIRECT in the last 72 hours. Thyroid Function Tests No results for input(s): TSH, T4TOTAL, T3FREE, THYROIDAB in the last 72 hours.  Invalid input(s): FREET3  Other results:   Imaging    No results found.   Medications:     Scheduled Medications: . amiodarone  200 mg Oral Daily  . apixaban  5 mg Oral BID  . atorvastatin  40 mg Oral QHS  . feeding supplement ([place])  237 mL Oral TID BM  . ferrous sulfate  325 mg Oral Q M,W,F-1800  . fluticasone  1 spray Each Nare Daily  . Gerhardt's butt cream  1 application Topical BID  . insulin aspart  0-9 Units Subcutaneous TID WC  . mometasone-formoterol  1 puff Inhalation BID   And  . umeclidinium bromide  1 puff Inhalation Daily  . potassium chloride SA  20 mEq Oral BID  . tamsulosin  0.4 mg Oral QPC breakfast  . torsemide  80 mg Oral BID    Infusions: . sodium chloride    . sodium chloride      PRN Medications: sodium chloride, acetaminophen, famotidine, fluticasone, ipratropium-albuterol, LORazepam, oxybutynin, oxyCODONE-acetaminophen **AND** oxyCODONE    Assessment/Plan   1. 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 RV severely reduced. Admitted for ADHF. BNP higher from prior baseline, 3,522. CXR w/ moderate left pleural effusion + mild pulmonary edema.  Suspect recurrent AFL contributing. Volume status improved with diuresis.  Unfortunately, got both IV Lasix and po torsemide yesterday. Creatinine mildly higher at 2.1.  - Hold am torsemide, resume torsemide 80 mg po bid this evening. - Would not use SGT2i due to chronic groin/skin infection.  2. Atrial Fibrillation/ Flutter: Paroxysmal. He has history of CVA. He had DCCV in 3/20 and again in 5/20. He failed amiodarone in the past and cannot take Tikosyn due to baseline prolonged QT interval. Underwent recent TEE/DCCV for AFL 12/17/20. He was back in Atrial flutter with controlled rate.  PO amiodarone held on admit due to pronged QTc 543 ms but was in A flutter QTc read on ECG not accurate. He remains in flutter.  Per EP, not ablation candidate.  - Amiodarone 200 mg daily.  - Continue Eliquis 5 mg bid, has not missed doses.  - DCCV today, discussed risks/benefits with patient and he agrees to procedure.  - needs to improve compliance w/ home BiPAP (refusing here).  3. ChronicHypoxic/Hypercarbic Respiratory failure: Due toADCHF + COPD/OSA/OHS w/ poor compliance w/ home O2 and BiPAP. 4. Stage III CKD: new baseline around 2.  2.1 today.  5. COPD:No longer smokes. Continue homeoxygen.  6.OHS/OSA:Continue oxygen during the day andBIPAP at night.  7.CAD:Nonobstructive disease in 2/20. No chest pain.  - no ASA due to anticoagulation. -Atorvastatin.  Refuses PT, consistently refuses any form of home health, paramedicine.  Uses ER as PCP and very rarely comes to outpatient appts.  Unfortunately, very difficult situation  with high risk of readmission.   Loralie Champagne 01/10/2021 7:55 AM

## 2021-01-10 NOTE — Discharge Summary (Signed)
Physician Discharge Summary  Nicholas Caldwell VOJ:[id number] DOB: [birth year] DOA: 01/05/2021  PCP: [name], MD  Admit date: 01/05/2021 Discharge date: 01/10/2021  Time spent: 37 minutes  Recommendations for Outpatient Follow-up:  1. Patient requires daily weights in addition to close fluid status monitoring in the outpatient setting 2. I will enlist prior medicine to help-historically patient has been noncompliant with having people visit at the home-I have requested that the wife make an appointment with PCP at the [facility] prior to discharge to ensure that he has a transition visit there 3. Please follow immunofixation protein electrophoresis and methylmalonic acid in the outpatient setting to rule out nonspecific right lower extremity pain?  Neuropathy 4. Will need home health which we will reengage again-he has not wanted hospital at home program previously 5. Consider outpatient colonoscopy for microcytic anemia if not done before  Discharge Diagnoses:  MAIN problem for hospitalization   Acute decompensated heart failure with atrial flutter  Please see below for itemized issues addressed in HOpsital- refer to other progress notes for clarity if needed  Discharge Condition: Guarded as patient continues to refuse therapeutic interventions and home monitoring  Diet recommendation: Heart healthy  Filed Weights   01/09/21 0402 01/10/21 0500 01/10/21 0920  Weight: 101.5 kg 102 kg 102 kg    History of present illness:  75 year old community dwelling black male COPD OSA on BiPAP Chronic systolic heart failure 2/2 and ICM + RV failure + severe pulmonary HTN Nonobstructive CAD on cath 12/2018 PEA arrest 08/2019? Narcotic over use and under use of right  IDDM P A. fib/flutter + DCCV 12/17/2020 Dr. Algernon Huxley (cannot tolerate Tikosyn)-failed amiodarone  Patient has been hospitalized 5 times in the last 6 months  Return [place] 3/6 AM --SOB PND +10 pounds weight gain-baseline O2 rec = now 6 L up  from 3  -Afebrile tachypneic incomplete RBBB with prolonged QTC CXR = CHF BNP 3522 troponin 33 Started on Lasix IV 80  Heart failure saw the team in consult and report that he has been very difficult to engage with prior medicine and that is the reason why he was readmitted so often Underwent flutter ablation 3/10 conversion to sinus  Hospital Course:  Acute decompensated multifactorial heart failure (severe pulmonary HTN right-sided failure) ATN superimposed on 3B versus cardiorenal syndrome P A. fib CHADS2 score >4 on Eliquis  Discharge medications as below after collaboration with  Dr. Algernon Huxley   currently is -5.5 L weight down 104-->102 Flutter ablation performed by Dr. Algernon Huxley on 3/10 and successfully cardioverted to sinus rhythm Contraction/mild metabolic alkalosis Patient developing mild alkalosis -consider Diamox In the outpatient setting Bilateral lower extremity pain and weakness that was sudden onset Chronic 4 month lower extremity pain right side Dr. Parke Simmers evaluated 3/7-follow SPEP immunofixation methylmalonic  in the outpatient setting  --Neurology feels multifactorial and will need outpatient work-up at the [facility] IDDM A1c 6.8-sliding scale coverage at this time             CBG controlled fairly in the hospital             Resume 70/30 insulin at 10 units twice daily on  discharge Dilutional anemia-history hemorrhoids             Iron studies performed = iron deficiency             Nulecit given 3/7-resumed oral tid FeSO4             Outpatient colonoscopy? acute hypoxic respiratory failure as above OPD  OSA Resume DuoNeb every 6 as needed, Incruse Ellipta 1 puff daily Sacral decubitus from prior to admission             Placing Gerhart cream and turn frequently  Procedures:  Flutter ablation 3/10 DCCV 200 J Dr. Algernon Huxley   Consultations:  Advanced heart failure team  Electrophysiology  Discharge Exam: Vitals:   01/10/21 0947 01/10/21  0955  BP: 120/64 (!) 108/50  Pulse: 86 80  Resp: 20 (!) 23  Temp:    SpO2: 91% 94%    Subj on day of d/c   Patient is fair He tells me he feels close to a 7 on 10 out of his usual He states he is moving around somewhat better I sat down with him and spoke to him and his wife about getting home health and that he is optimized from my perspective for going home-he will need close monitoring of his kidney function  General Exam on discharge  Awake alert coherent no distress EOMI NCAT no focal deficit CTA B no added sound no rales no rhonchi S1-S2 seems to be in sinus no abdomen soft nontender Right leg is remarkably improved is able to lift it off the bed is able to dorsi and plantarflex whereas previously he was not Sensory is intact  Discharge Instructions   Discharge Instructions    Diet - low sodium heart healthy   Complete by: As directed    Discharge instructions   Complete by: As directed    You will need to continue the medications that have been listed for you carefully and take them regularly my recommendation is that you will go to see your Dr. Deloria Lair at the [facility] so that you can collaborate with him or her and find a way that we can help you stay out of the hospital I will order home health nurse to help you with medication management as well as therapy to help you move your right lower extremity  I do think that you will do well overall--if you can comply with fluid restriction and meds Good luck   Discharge wound care:   Complete by: As directed    Pressure Injury Coccyx Medial Stage 2 -  Partial thickness loss of dermis presenting as a shallow open injury with a red, pink wound bed without slough. -- days   Pressure Injury 11/22/20 Sacrum Right Stage 2 -  Partial thickness loss of dermis presenting as a shallow open injury with a red, pink wound bed without slough. 3x1 cm 49 days  Pressure Injury 01/06/21 Buttocks Left;Proximal Stage 2 -  Partial  thickness loss of dermis presenting as a shallow open injury with a red, pink wound bed without slough. 4 days  Pressure Injury 01/06/21 Buttocks Right;Proximal Stage 2 -  Partial thickness loss of dermis presenting as a shallow open injury with a red, pink wound bed without slough. 4 days  Wound / Incision (Open or Dehisced) 01/06/21 Non-pressure wound Toe (Comment  which one) Anterior;Right 4 days  Wound / Incision (Open or Dehisced) 01/06/21 Toe (Comment  which one) Anterior;Right   Increase activity slowly   Complete by: As directed      Allergies as of 01/10/2021      Reactions   Isosorb Dinitrate-hydralazine Shortness Of Breath   Benazepril Nausea And Vomiting   Brimonidine Other (See Comments)   Dry eyes   Metformin Other (See Comments)   Cramps and abdominal pain   Morphine Rash  Medication List    STOP taking these medications   cetirizine 10 MG tablet Commonly known as: ZYRTEC   diazepam 10 MG tablet Commonly known as: VALIUM   hydrocortisone 1 % lotion   insulin aspart 100 UNIT/ML injection Commonly known as: novoLOG   silver sulfADIAZINE 1 % cream Commonly known as: SILVADENE   sodium chloride 0.65 % Soln nasal spray Commonly known as: OCEAN     TAKE these medications   acetaminophen 325 MG tablet Commonly known as: TYLENOL Take 2 tablets (650 mg total) by mouth every 4 (four) hours as needed for headache or mild pain.   albuterol 108 (90 Base) MCG/ACT inhaler Commonly known as: VENTOLIN HFA Inhale 2 puffs into the lungs every 6 (six) hours as needed for wheezing or shortness of breath.   amiodarone 200 MG tablet Commonly known as: PACERONE Take 1 tablet (200 mg total) by mouth daily. Start taking on: January 11, 2021   apixaban 5 MG Tabs tablet Commonly known as: ELIQUIS Take 1 tablet (5 mg total) by mouth 2 (two) times daily.   atorvastatin 40 MG tablet Commonly known as: LIPITOR Take 1 tablet (40 mg total) by mouth daily. What changed:  when to take this   Breztri Aerosphere 160-9-4.8 MCG/ACT Aero Generic drug: Budeson-Glycopyrrol-Formoterol Inhale 2 puffs into the lungs 2 (two) times daily.   carbamide peroxide 6.5 % OTIC solution Commonly known as: DEBROX Place 5-10 drops into both ears 3 (three) times daily as needed (ear wax).   Carboxymethylcellulose Sod PF 0.25 % Soln Place 1 drop into both eyes 2 (two) times daily as needed (dry eyes).   famotidine 20 MG tablet Commonly known as: PEPCID TAKE 1 TABLET(20 MG) BY MOUTH TWICE DAILY What changed: See the new instructions.   feeding supplement (GLUCERNA SHAKE) Liqd Take 237 mLs by mouth 3 (three) times daily between meals.   ferrous sulfate 325 (65 FE) MG tablet Take 325 mg by mouth every Monday, Wednesday, and Friday at 6 PM.   fluticasone 50 MCG/ACT nasal spray Commonly known as: FLONASE Place 2 sprays into both nostrils daily as needed for allergies.   Gerhardt's butt cream Crea Apply 1 application topically 2 (two) times daily.   hydrocortisone 2.5 % rectal cream Commonly known as: ANUSOL-HC Place 1 application rectally 2 (two) times daily as needed for hemorrhoids or itching.   insulin aspart protamine- aspart (70-30) 100 UNIT/ML injection Commonly known as: NOVOLOG MIX 70/30 Inject 10 Units into the skin 2 (two) times daily with a meal.   Insulin Syringe 27G X 1/2" 0.5 ML Misc 100 Syringes by Does not apply route 3 (three) times daily.   ipratropium-albuterol 0.5-2.5 (3) MG/3ML Soln Commonly known as: DUONEB Take 3 mLs by nebulization 4 (four) times daily as needed.   latanoprost 0.005 % ophthalmic solution Commonly known as: XALATAN Place 1 drop into both eyes at bedtime.   lidocaine 5 % Commonly known as: LIDODERM Place 1 patch onto the skin daily. Remove & Discard patch within 12 hours or as directed by MD   oxybutynin 5 MG tablet Commonly known as: DITROPAN Take 1 tablet by mouth 4 (four) times daily as needed for incontinence.    oxyCODONE-acetaminophen 10-325 MG tablet Commonly known as: PERCOCET Take 1 tablet by mouth every 6 (six) hours as needed for pain.   OXYGEN Inhale 3 L/min into the lungs continuous.   pantoprazole 40 MG tablet Commonly known as: PROTONIX Take 40 mg by mouth 2 (two) times daily.  polyethylene glycol powder 17 GM/SCOOP powder Commonly known as: GLYCOLAX/MIRALAX Take 17 g by mouth daily as needed for constipation.   potassium chloride SA 20 MEQ tablet Commonly known as: KLOR-CON Take 1 tablet (20 mEq total) by mouth 2 (two) times daily. What changed: how much to take   senna-docusate 8.6-50 MG tablet Commonly known as: Senokot-S Take 1 tablet by mouth at bedtime as needed for mild constipation.   tamsulosin 0.4 MG Caps capsule Commonly known as: FLOMAX Take 1 capsule (0.4 mg total) by mouth daily. What changed: when to take this   terbinafine 1 % cream Commonly known as: LAMISIL Apply 1 application topically daily.   Torsemide 40 MG Tabs Take 80 mg by mouth 2 (two) times daily. What changed: when to take this   umeclidinium bromide 62.5 MCG/INH Aepb Commonly known as: INCRUSE ELLIPTA Inhale 1 puff into the lungs daily.   Vitamin D 50 MCG (2000 UT) Caps Take 2,000 Units by mouth daily.            Discharge Care Instructions  (From admission, onward)         Start     Ordered   01/10/21 0000  Discharge wound care:       Comments: Pressure Injury Coccyx Medial Stage 2 -  Partial thickness loss of dermis presenting as a shallow open injury with a red, pink wound bed without slough. -- days   Pressure Injury 11/22/20 Sacrum Right Stage 2 -  Partial thickness loss of dermis presenting as a shallow open injury with a red, pink wound bed without slough. 3x1 cm 49 days  Pressure Injury 01/06/21 Buttocks Left;Proximal Stage 2 -  Partial thickness loss of dermis presenting as a shallow open injury with a red, pink wound bed without slough. 4 days  Pressure Injury  01/06/21 Buttocks Right;Proximal Stage 2 -  Partial thickness loss of dermis presenting as a shallow open injury with a red, pink wound bed without slough. 4 days  Wound / Incision (Open or Dehisced) 01/06/21 Non-pressure wound Toe (Comment  which one) Anterior;Right 4 days  Wound / Incision (Open or Dehisced) 01/06/21 Toe (Comment  which one) Anterior;Right   01/10/21 1209         Allergies  Allergen Reactions  . Isosorb Dinitrate-Hydralazine Shortness Of Breath  . Benazepril Nausea And Vomiting  . Brimonidine Other (See Comments)    Dry eyes  . Metformin Other (See Comments)    Cramps and abdominal pain  . Morphine Rash    Follow-up Information    [facility], [facility] Follow up.   Specialty: Home Health Services Why: HHRN,HHPT, [place], HHAIDE, SW Contact information: [place] [phone]        [facility] Follow up on 01/31/2021.   Specialty: Cardiology Why: 12:00 PM [facility], [place] [address]  Contact information: [address] [id number] [place] [phone]               The results of significant diagnostics from this hospitalization (including imaging, microbiology, ancillary and laboratory) are listed below for reference.    Significant Diagnostic Studies: CT Abdomen Pelvis Wo Contrast  Result Date: 12/11/2020 CLINICAL DATA:  Abdominal distension. Abscess or infection suspected. Nausea and vomiting EXAM: CT ABDOMEN AND PELVIS WITHOUT CONTRAST TECHNIQUE: Multidetector CT imaging of the abdomen and pelvis was performed following the  standard protocol without IV contrast. COMPARISON:  CT 10/05/2020 CT 01/03/2019 FINDINGS: Lower chest: Dense consolidation LEFT lung base. Interlobular septal thickening at the RIGHT lung base. 7 mm nodule in the RIGHT lower lobe on image 7/4.  Interval improvement in RIGHT pleural effusion. LEFT basilar consolidation is worse. Hepatobiliary: No focal hepatic lesion on noncontrast exam. Gallbladder is distended to 5.7 cm. No radiodense gallstones. No gallbladder inflammation. Intrahepatic duct dilatation on noncontrast exam Pancreas: Pancreas is normal. No ductal dilatation. No pancreatic inflammation. Spleen: Multiple splenules in the LEFT upper quadrant. Adrenals/urinary tract: Of LEFT adrenal gland thickening to 19 mm has attenuation greater than benign adenoma. No nephrolithiasis or ureterolithiasis Stomach/Bowel: Stomach, small bowel, appendix, and cecum are normal. Ascending transverse colon normal. There is moderate volume stool in the descending colon. Large volume stool in the rectosigmoid colon. Stool ball in the rectum measuring 7 cm. Vascular/Lymphatic: Abdominal aorta is normal caliber with atherosclerotic calcification. There is no retroperitoneal or periportal lymphadenopathy. No pelvic lymphadenopathy. Reproductive: Prostate unremarkable Other: No intraperitoneal free air Musculoskeletal: LEFT hip prosthetic.  No acute osseous findings IMPRESSION: 1. Large volume stool in the rectosigmoid suggest constipation. 2. Dense consolidation in the LEFT lower lobe new from comparison exam. 3. Improvement in RIGHT pleural effusion. 4. RIGHT lobe pulmonary nodule. Recommend follow-up CT chest in 3 months. 5. Distended gallbladder without evidence acute cholecystitis. 6. Nodular thickening of the LEFT adrenal gland similar to CT 01/03/2019 Electronically Signed   By: Suzy Bouchard M.D.   On: 12/11/2020 14:28   DG Chest 2 View  Result Date: 01/05/2021 CLINICAL DATA:  Dyspnea, lower extremity edema EXAM: CHEST - 2 VIEW COMPARISON:  12/11/2020 FINDINGS: Pulmonary insufflation is stable the lungs are symmetrically expanded. Moderate left pleural effusion is present. Tiny right pleural effusion is suspected. Diffuse interstitial pulmonary infiltrate  is present demonstrating a basilar predominance most in keeping with mild pulmonary edema, likely cardiogenic in nature. Mild to moderate cardiomegaly is unchanged. Central pulmonary arterial enlargement is again noted. No pneumothorax. No acute bone abnormality. IMPRESSION: Mild cardiogenic failure.  Small bilateral pleural effusions. Electronically Signed   By: Fidela Salisbury MD   On: 01/05/2021 23:09   CT Head Wo Contrast  Result Date: 12/11/2020 CLINICAL DATA:  Altered mental status. EXAM: CT HEAD WITHOUT CONTRAST TECHNIQUE: Contiguous axial images were obtained from the base of the skull through the vertex without intravenous contrast. COMPARISON:  Head CT scan 04/09/2019. FINDINGS: Brain: No evidence of acute infarction, hemorrhage, hydrocephalus, extra-axial collection or mass lesion/mass effect. Chronic microvascular ischemic change and cavum septum pellucidum et vergae noted. Vascular: No hyperdense vessel.  Atherosclerosis is seen. Skull: Intact.  No focal lesion. Sinuses/Orbits: Negative. Other: None. IMPRESSION: No acute abnormality. Chronic microvascular ischemic change. Atherosclerosis. Electronically Signed   By: Inge Rise M.D.   On: 12/11/2020 14:00   US RENAL  Result Date: 12/11/2020 CLINICAL DATA:  Acute renal failure EXAM: RENAL / URINARY TRACT ULTRASOUND COMPLETE COMPARISON:  None. FINDINGS: Right Kidney: Renal measurements: 10.6 x 6.1 x 5.6 cm = volume: 188 mL. Echogenicity within normal limits. No mass or hydronephrosis visualized. Left Kidney: Renal measurements: 10.9 x 5.3 x 4.8 cm = volume: 143 mL. Echogenicity within normal limits. No mass or hydronephrosis visualized. Bladder: Bladder decompressed and poorly assessed by sonography. Other: None. IMPRESSION: 1. Unremarkable sonographic appearance of the kidneys. 2. Decompressed urinary bladder poorly assessed by sonography. Electronically Signed   By: Lovena Le M.D.   On: 12/11/2020 18:44   ECHO TEE  Result  Date:  12/17/2020    TRANSESOPHOGEAL ECHO REPORT   Patient Name:   ULYSEE FYOCK Date of Exam: 12/17/2020 Medical Rec #:  [id number]    Height:       72.0 in Accession #:    [id number]   Weight:       212.1 lb Date of Birth:  07-23-1946    BSA:          2.184 m Patient Age:    77 years     BP:           119/54 mmHg Patient Gender: M            HR:           91 bpm. Exam Location:  Inpatient Procedure: Transesophageal Echo, Cardiac Doppler and Color Doppler Indications:     Atrial flibrillation/flutter  History:         Patient has prior history of Echocardiogram examinations, most                  recent 09/26/2020. CHF, CAD, COPD, Arrythmias:Atrial                  Fibrillation; Risk Factors:Hypertension, Diabetes, Dyslipidemia                  and Former Smoker.  Sonographer:     Clayton Lefort RDCS (AE) Referring Phys:  [id number] AMY D CLEGG Diagnosing Phys: Loralie Champagne MD PROCEDURE: After discussion of the risks and benefits of a TEE, an informed consent was obtained from the patient. The transesophogeal probe was passed without difficulty through the esophogus of the patient. Local oropharyngeal anesthetic was provided with Cetacaine. Sedation performed by different physician. The patient was monitored while under deep sedation. Anesthestetic sedation was provided intravenously by Anesthesiology: 149.41m of Propofol, 1019mof Lidocaine. Image quality was adequate. The  patient developed no complications during the procedure. A successful direct current cardioversion was performed at 200 joules with 1 attempt. IMPRESSIONS  1. Left ventricular ejection fraction, by estimation, is 45 to 50%. The left ventricle has mildly decreased function. The left ventricle demonstrates global hypokinesis. There is mild left ventricular hypertrophy.  2. Right ventricular systolic function is severely reduced. The right ventricular size is severely enlarged. D-shaped interventricular septum suggestive of RV pressure/volume overload.  3. No  left atrial/left atrial appendage thrombus was detected.  4. Right atrial size was moderately dilated.  5. The mitral valve is normal in structure. Trivial mitral valve regurgitation. No evidence of mitral stenosis.  6. The aortic valve is tricuspid. Aortic valve regurgitation is mild. No aortic stenosis is present.  7. No PFO or ASD by color doppler.  8. Peak RV-RA gradient 53 mmHg.  9. A small pericardial effusion is present. FINDINGS  Left Ventricle: Left ventricular ejection fraction, by estimation, is 45 to 50%. The left ventricle has mildly decreased function. The left ventricle demonstrates global hypokinesis. The left ventricular internal cavity size was normal in size. There is  mild left ventricular hypertrophy. Right Ventricle: The right ventricular size is severely enlarged. No increase in right ventricular wall thickness. Right ventricular systolic function is severely reduced. Left Atrium: Left atrial size was normal in size. No left atrial/left atrial appendage thrombus was detected. Right Atrium: Right atrial size was moderately dilated. Pericardium: A small pericardial effusion is present. Mitral Valve: The mitral valve is normal in structure. Trivial mitral valve regurgitation. No evidence of mitral valve stenosis. Tricuspid Valve: The tricuspid valve is normal  in structure. Tricuspid valve regurgitation is trivial. Aortic Valve: The aortic valve is tricuspid. Aortic valve regurgitation is mild. No aortic stenosis is present. Pulmonic Valve: The pulmonic valve was normal in structure. Pulmonic valve regurgitation is trivial. No evidence of pulmonic stenosis. Aorta: The aortic root is normal in size and structure. IAS/Shunts: No PFO or ASD by color doppler.  TRICUSPID VALVE TR Peak grad:   52.7 mmHg TR Vmax:        363.00 cm/s Loralie Champagne MD Electronically signed by Loralie Champagne MD Signature Date/Time: 12/17/2020/3:46:44 PM    Final     Microbiology: Recent Results (from the past 240 hour(s))   Resp Panel by RT-PCR (Flu A&B, Covid) Nasopharyngeal Swab     Status: None   Collection Time: 01/06/21  3:54 AM   Specimen: Nasopharyngeal Swab; Nasopharyngeal(NP) swabs in vial transport medium  Result Value Ref Range Status   SARS Coronavirus 2 by RT PCR NEGATIVE NEGATIVE Final    Comment: (NOTE) SARS-CoV-2 target nucleic acids are NOT DETECTED.  The SARS-CoV-2 RNA is generally detectable in upper respiratory specimens during the acute phase of infection. The lowest concentration of SARS-CoV-2 viral copies this assay can detect is 138 copies/mL. A negative result does not preclude SARS-Cov-2 infection and should not be used as the sole basis for treatment or other patient management decisions. A negative result may occur with  improper specimen collection/handling, submission of specimen other than nasopharyngeal swab, presence of viral mutation(s) within the areas targeted by this assay, and inadequate number of viral copies(<138 copies/mL). A negative result must be combined with clinical observations, patient history, and epidemiological information. The expected result is Negative.  Fact Sheet for Patients:  EntrepreneurPulse.com.au  Fact Sheet for Healthcare Providers:  IncredibleEmployment.be  This test is no t yet approved or cleared by the [place] FDA and  has been authorized for detection and/or diagnosis of SARS-CoV-2 by FDA under an Emergency Use Authorization (EUA). This EUA will remain  in effect (meaning this test can be used) for the duration of the COVID-19 declaration under Section 564(b)(1) of the Act, 21 U.S.C.section 360bbb-3(b)(1), unless the authorization is terminated  or revoked sooner.       Influenza A by PCR NEGATIVE NEGATIVE Final   Influenza B by PCR NEGATIVE NEGATIVE Final    Comment: (NOTE) The Xpert Xpress SARS-CoV-2/FLU/RSV plus assay is intended as an aid in the diagnosis of influenza from  Nasopharyngeal swab specimens and should not be used as a sole basis for treatment. Nasal washings and aspirates are unacceptable for Xpert Xpress SARS-CoV-2/FLU/RSV testing.  Fact Sheet for Patients: EntrepreneurPulse.com.au  Fact Sheet for Healthcare Providers: IncredibleEmployment.be  This test is not yet approved or cleared by the [place] FDA and has been authorized for detection and/or diagnosis of SARS-CoV-2 by FDA under an Emergency Use Authorization (EUA). This EUA will remain in effect (meaning this test can be used) for the duration of the COVID-19 declaration under Section 564(b)(1) of the Act, 21 U.S.C. section 360bbb-3(b)(1), unless the authorization is terminated or revoked.  Performed at [facility], [place] [address], [place], [place] [address]   MRSA PCR Screening     Status: None   Collection Time: 01/06/21  5:00 AM   Specimen: Nasal Mucosa; Nasopharyngeal  Result Value Ref Range Status   MRSA by PCR NEGATIVE NEGATIVE Final    Comment:        The GeneXpert MRSA Assay (FDA approved for NASAL specimens only), is one component  of a comprehensive MRSA colonization surveillance program. It is not intended to diagnose MRSA infection nor to guide or monitor treatment for MRSA infections. Performed at [facility], [place] [address], [place], [place] [address]      Labs: Basic Metabolic Panel: Recent Labs  Lab 01/05/21 2213 01/06/21 0437 01/07/21 0057 01/08/21 0032 01/09/21 0119 01/10/21 0032  NA 143  --  141 140 141 140  K 3.5  --  3.7 3.8 3.8 3.7  CL 103  --  100 98 98 95*  CO2 28  --  30 33* 33* 38*  GLUCOSE 78  --  130* 106* 105* 118*  BUN 49*  --  47* 41* 41* 40*  CREATININE 2.16*  --  2.11* 1.92* 1.91* 2.11*  CALCIUM 8.1*  --  8.3* 8.6* 8.6* 8.6*  MG  --  2.0  --   --   --   --    Liver Function Tests: Recent Labs  Lab 01/09/21 0119  AST 18  ALT 11  ALKPHOS 75  BILITOT 0.5  PROT 6.1*   ALBUMIN 2.3*   No results for input(s): LIPASE, AMYLASE in the last 168 hours. No results for input(s): AMMONIA in the last 168 hours. CBC: Recent Labs  Lab 01/05/21 2213 01/06/21 0438 01/07/21 0057 01/08/21 0032 01/10/21 0032  WBC 4.5 5.2 5.3 5.9 4.6  NEUTROABS  --   --   --   --  3.0  HGB 9.5* 8.2* 8.5* 8.7* 8.9*  HCT 32.5* 27.6* 29.1* 28.3* 28.5*  MCV 87.1 87.3 86.6 85.2 84.6  PLT 515* 478* 460* 472* 399   Cardiac Enzymes: No results for input(s): CKTOTAL, CKMB, CKMBINDEX, TROPONINI in the last 168 hours. BNP: BNP (last 3 results) Recent Labs    11/21/20 1944 12/11/20 0730 01/05/21 2213  BNP 2,475.4* 1,858.3* 3,522.2*    ProBNP (last 3 results) No results for input(s): PROBNP in the last 8760 hours.  CBG: Recent Labs  Lab 01/09/21 0618 01/09/21 1120 01/09/21 1623 01/09/21 2114 01/10/21 0556  GLUCAP 133* 106* 98 165* 126*       Signed:  Nita Sells MD   [facility] 01/10/2021, 12:09 PM

## 2021-01-10 NOTE — Progress Notes (Signed)
Transported to endoscopy for cardioversion by bed awake and alert.

## 2021-01-10 NOTE — Anesthesia Preprocedure Evaluation (Addendum)
Anesthesia Evaluation  Patient identified by MRN, date of birth, ID band Patient awake    Reviewed: Allergy & Precautions, NPO status , Patient's Chart, lab work & pertinent test results  Airway Mallampati: II  TM Distance: >3 FB Neck ROM: Full    Dental  (+)    Pulmonary sleep apnea and Oxygen sleep apnea , COPD, former smoker,    Pulmonary exam normal breath sounds clear to auscultation       Cardiovascular hypertension, + CAD and +CHF  Normal cardiovascular exam Rhythm:Irregular Rate:Abnormal  Pulmonary HTN  12/17/20 ECHO: 1. Left ventricular ejection fraction, by estimation, is 45 to 50%. The  left ventricle has mildly decreased function. The left ventricle  demonstrates global hypokinesis. There is mild left ventricular  hypertrophy.  2. Right ventricular systolic function is severely reduced. The right  ventricular size is severely enlarged. D-shaped interventricular septum  suggestive of RV pressure/volume overload.  3. No left atrial/left atrial appendage thrombus was detected.  4. Right atrial size was moderately dilated.  5. The mitral valve is normal in structure. Trivial mitral valve  regurgitation. No evidence of mitral stenosis.  6. The aortic valve is tricuspid. Aortic valve regurgitation is mild. No  aortic stenosis is present.  7. No PFO or ASD by color doppler.  8. Peak RV-RA gradient 53 mmHg.  9. A small pericardial effusion is present.    Neuro/Psych  Neuromuscular disease CVA negative psych ROS   GI/Hepatic negative GI ROS, Neg liver ROS,   Endo/Other  negative endocrine ROSdiabetes  Renal/GU Renal disease  negative genitourinary   Musculoskeletal  (+) Arthritis , Osteoarthritis,    Abdominal   Peds negative pediatric ROS (+)  Hematology negative hematology ROS (+) anemia ,   Anesthesia Other Findings   Reproductive/Obstetrics negative OB ROS                            Anesthesia Physical Anesthesia Plan  ASA: III  Anesthesia Plan: General   Post-op Pain Management:    Induction: Intravenous  PONV Risk Score and Plan: 2 and Propofol infusion, TIVA and Treatment may vary due to age or medical condition  Airway Management Planned: Mask  Additional Equipment:   Intra-op Plan:   Post-operative Plan:   Informed Consent: I have reviewed the patients History and Physical, chart, labs and discussed the procedure including the risks, benefits and alternatives for the proposed anesthesia with the patient or authorized representative who has indicated his/her understanding and acceptance.       Plan Discussed with: CRNA, Anesthesiologist and Surgeon  Anesthesia Plan Comments:         Anesthesia Quick Evaluation

## 2021-01-10 NOTE — Anesthesia Procedure Notes (Signed)
Procedure Name: General with mask airway Date/Time: 01/10/2021 9:40 AM Performed by: Imagene Riches, CRNA Pre-anesthesia Checklist: Emergency Drugs available, Patient identified, Suction available, Patient being monitored and Timeout performed Patient Re-evaluated:Patient Re-evaluated prior to induction Oxygen Delivery Method: Ambu bag Preoxygenation: Pre-oxygenation with 100% oxygen

## 2021-01-10 NOTE — Progress Notes (Addendum)
Patient ID: Nicholas Caldwell, male   DOB: November 21, 1945, 75 y.o.   MRN: [id number]     Advanced Heart Failure Rounding Note  PCP-Cardiologist: No primary care provider on file.   Subjective:    Patient got both IV Lasix and po torsemide.   Scr down from admit and stable, 2.11>>1.92>>1.91>>2.11  Remains in rate-controlled flutter. Evaluated by EP, not a candidate for ablation.    Objective:   Weight Range: 102 kg Body mass index is 30.5 kg/m.   Vital Signs:   Temp:  [98.5 F (36.9 C)-98.8 F (37.1 C)] 98.5 F (36.9 C) (03/10 0300) Pulse Rate:  [69-75] 72 (03/10 0600) Resp:  [16-26] 19 (03/10 0600) BP: (94-117)/(46-65) 98/46 (03/10 0600) SpO2:  [90 %-96 %] 94 % (03/10 0600) Weight:  [102 kg] 102 kg (03/10 0500) Last BM Date: 01/08/21  Weight change: Filed Weights   01/08/21 0743 01/09/21 0402 01/10/21 0500  Weight: 103.3 kg 101.5 kg 102 kg    Intake/Output:   Intake/Output Summary (Last 24 hours) at 01/10/2021 0755 Last data filed at 01/10/2021 0604 Gross per 24 hour  Intake 100 ml  Output 2150 ml  Net -2050 ml      Physical Exam   General: NAD Neck: No JVD, no thyromegaly or thyroid nodule.  Lungs: Clear to auscultation bilaterally with normal respiratory effort. CV: Nondisplaced PMI.  Heart irregular S1/S2, no S3/S4, no murmur.  No peripheral edema.   Abdomen: Soft, nontender, no hepatosplenomegaly, no distention.  Skin: Intact without lesions or rashes.  Neurologic: Alert and oriented x 3.  Psych: Normal affect. Extremities: No clubbing or cyanosis.  HEENT: Normal.    Telemetry   Atrial flutter 70s (personally reviewed).   EKG   No new EKG to review   Labs    CBC Recent Labs    01/08/21 0032 01/10/21 0032  WBC 5.9 4.6  NEUTROABS  --  3.0  HGB 8.7* 8.9*  HCT 28.3* 28.5*  MCV 85.2 84.6  PLT 472* 578   Basic Metabolic Panel Recent Labs    01/09/21 0119 01/10/21 0032  NA 141 140  K 3.8 3.7  CL 98 95*  CO2 33* 38*  GLUCOSE 105* 118*   BUN 41* 40*  CREATININE 1.91* 2.11*  CALCIUM 8.6* 8.6*   Liver Function Tests Recent Labs    01/09/21 0119  AST 18  ALT 11  ALKPHOS 75  BILITOT 0.5  PROT 6.1*  ALBUMIN 2.3*   No results for input(s): LIPASE, AMYLASE in the last 72 hours. Cardiac Enzymes No results for input(s): CKTOTAL, CKMB, CKMBINDEX, TROPONINI in the last 72 hours.  BNP: BNP (last 3 results) Recent Labs    11/21/20 1944 12/11/20 0730 01/05/21 2213  BNP 2,475.4* 1,858.3* 3,522.2*    ProBNP (last 3 results) No results for input(s): PROBNP in the last 8760 hours.   D-Dimer No results for input(s): DDIMER in the last 72 hours. Hemoglobin A1C No results for input(s): HGBA1C in the last 72 hours. Fasting Lipid Panel No results for input(s): CHOL, HDL, LDLCALC, TRIG, CHOLHDL, LDLDIRECT in the last 72 hours. Thyroid Function Tests No results for input(s): TSH, T4TOTAL, T3FREE, THYROIDAB in the last 72 hours.  Invalid input(s): FREET3  Other results:   Imaging    No results found.   Medications:     Scheduled Medications: . amiodarone  200 mg Oral Daily  . apixaban  5 mg Oral BID  . atorvastatin  40 mg Oral QHS  . feeding supplement ([place])  237 mL Oral TID BM  . ferrous sulfate  325 mg Oral Q M,W,F-1800  . fluticasone  1 spray Each Nare Daily  . Gerhardt's butt cream  1 application Topical BID  . insulin aspart  0-9 Units Subcutaneous TID WC  . mometasone-formoterol  1 puff Inhalation BID   And  . umeclidinium bromide  1 puff Inhalation Daily  . potassium chloride SA  20 mEq Oral BID  . tamsulosin  0.4 mg Oral QPC breakfast  . torsemide  80 mg Oral BID    Infusions: . sodium chloride    . sodium chloride      PRN Medications: sodium chloride, acetaminophen, famotidine, fluticasone, ipratropium-albuterol, LORazepam, oxybutynin, oxyCODONE-acetaminophen **AND** oxyCODONE    Assessment/Plan   1. 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 RV severely reduced. Admitted for ADHF. BNP higher from prior baseline, 3,522. CXR w/ moderate left pleural effusion + mild pulmonary edema.  Suspect recurrent AFL contributing. Volume status improved with diuresis.  Unfortunately, got both IV Lasix and po torsemide yesterday. Creatinine mildly higher at 2.1.  - Hold am torsemide, resume torsemide 80 mg po bid this evening. - Would not use SGT2i due to chronic groin/skin infection.  2. Atrial Fibrillation/ Flutter: Paroxysmal. He has history of CVA. He had DCCV in 3/20 and again in 5/20. He failed amiodarone in the past and cannot take Tikosyn due to baseline prolonged QT interval. Underwent recent TEE/DCCV for AFL 12/17/20. He was back in Atrial flutter with controlled rate.  PO amiodarone held on admit due to pronged QTc 543 ms but was in A flutter QTc read on ECG not accurate. He remains in flutter.  Per EP, not ablation candidate.  - Amiodarone 200 mg daily.  - Continue Eliquis 5 mg bid, has not missed doses.  - DCCV today, discussed risks/benefits with patient and he agrees to procedure.  - needs to improve compliance w/ home BiPAP (refusing here).  3. ChronicHypoxic/Hypercarbic Respiratory failure: Due toADCHF + COPD/OSA/OHS w/ poor compliance w/ home O2 and BiPAP. 4. Stage III CKD: new baseline around 2.  2.1 today.  5. COPD:No longer smokes. Continue homeoxygen.  6.OHS/OSA:Continue oxygen during the day andBIPAP at night.  7.CAD:Nonobstructive disease in 2/20. No chest pain.  - no ASA due to anticoagulation. -Atorvastatin.  Refuses PT, consistently refuses any form of home health, paramedicine.  Uses ER as PCP and very rarely comes to outpatient appts.  Unfortunately, very difficult situation  with high risk of readmission.   Loralie Champagne 01/10/2021 7:55 AM  Successful DCCV, now back in NSR.    From my standpoint, he could go home this afternoon.  We will make followup.  Cardiac meds for discharge: torsemide 80 mg po bid, amiodarone 200 daily, apixaban 5 bid, atorvastatin 40 daily, KCl 20 bid.    Loralie Champagne 01/10/2021 10:11 AM

## 2021-01-10 NOTE — Transfer of Care (Signed)
Immediate Anesthesia Transfer of Care Note  Patient: Nicholas Caldwell  Procedure(s) Performed: CARDIOVERSION (N/A )  Patient Location: Endoscopy Unit  Anesthesia Type:General  Level of Consciousness: drowsy  Airway & Oxygen Therapy: Patient Spontanous Breathing and Patient connected to nasal cannula oxygen  Post-op Assessment: Report given to RN and Post -op Vital signs reviewed and stable  Post vital signs: Reviewed and stable  Last Vitals:  Vitals Value Taken Time  BP    Temp    Pulse    Resp    SpO2      Last Pain:  Vitals:   01/10/21 0920  TempSrc: Oral  PainSc: 7       Patients Stated Pain Goal: 2 (23/55/73 2202)  Complications: No complications documented.

## 2021-01-10 NOTE — Progress Notes (Signed)
Patient refuse NIV for the night

## 2021-01-10 NOTE — Procedures (Signed)
Electrical Cardioversion Procedure Note [place] [place] [id number] [birth year]  Procedure: Electrical Cardioversion Indications:  Atrial Flutter  Procedure Details Consent: Risks of procedure as well as the alternatives and risks of each were explained to the (patient/caregiver).  Consent for procedure obtained. Time Out: Verified patient identification, verified procedure, site/side was marked, verified correct patient position, special equipment/implants available, medications/allergies/relevent history reviewed, required imaging and test results available.  Performed  Patient placed on cardiac monitor, pulse oximetry, supplemental oxygen as necessary.  Sedation given: Per anesthesiology Pacer pads placed anterior and posterior chest.  Cardioverted 1 time(s).  Cardioverted at [place].  Evaluation Findings: Post procedure EKG shows: NSR Complications: None Patient did tolerate procedure well.   Loralie Champagne 01/10/2021, 9:44 AM

## 2021-01-10 NOTE — Progress Notes (Signed)
Reported by NT that pt. refused cbg  because he is going home.

## 2021-01-11 ENCOUNTER — Other Ambulatory Visit: Payer: Self-pay

## 2021-01-11 IMAGING — DX DG CHEST 1V PORT
1 series · 1 of 1 positions shown · non-contrast
Comparison: July 18, 2019

CLINICAL DATA: Shortness of breath

EXAM:
PORTABLE CHEST 1 VIEW

[chest]
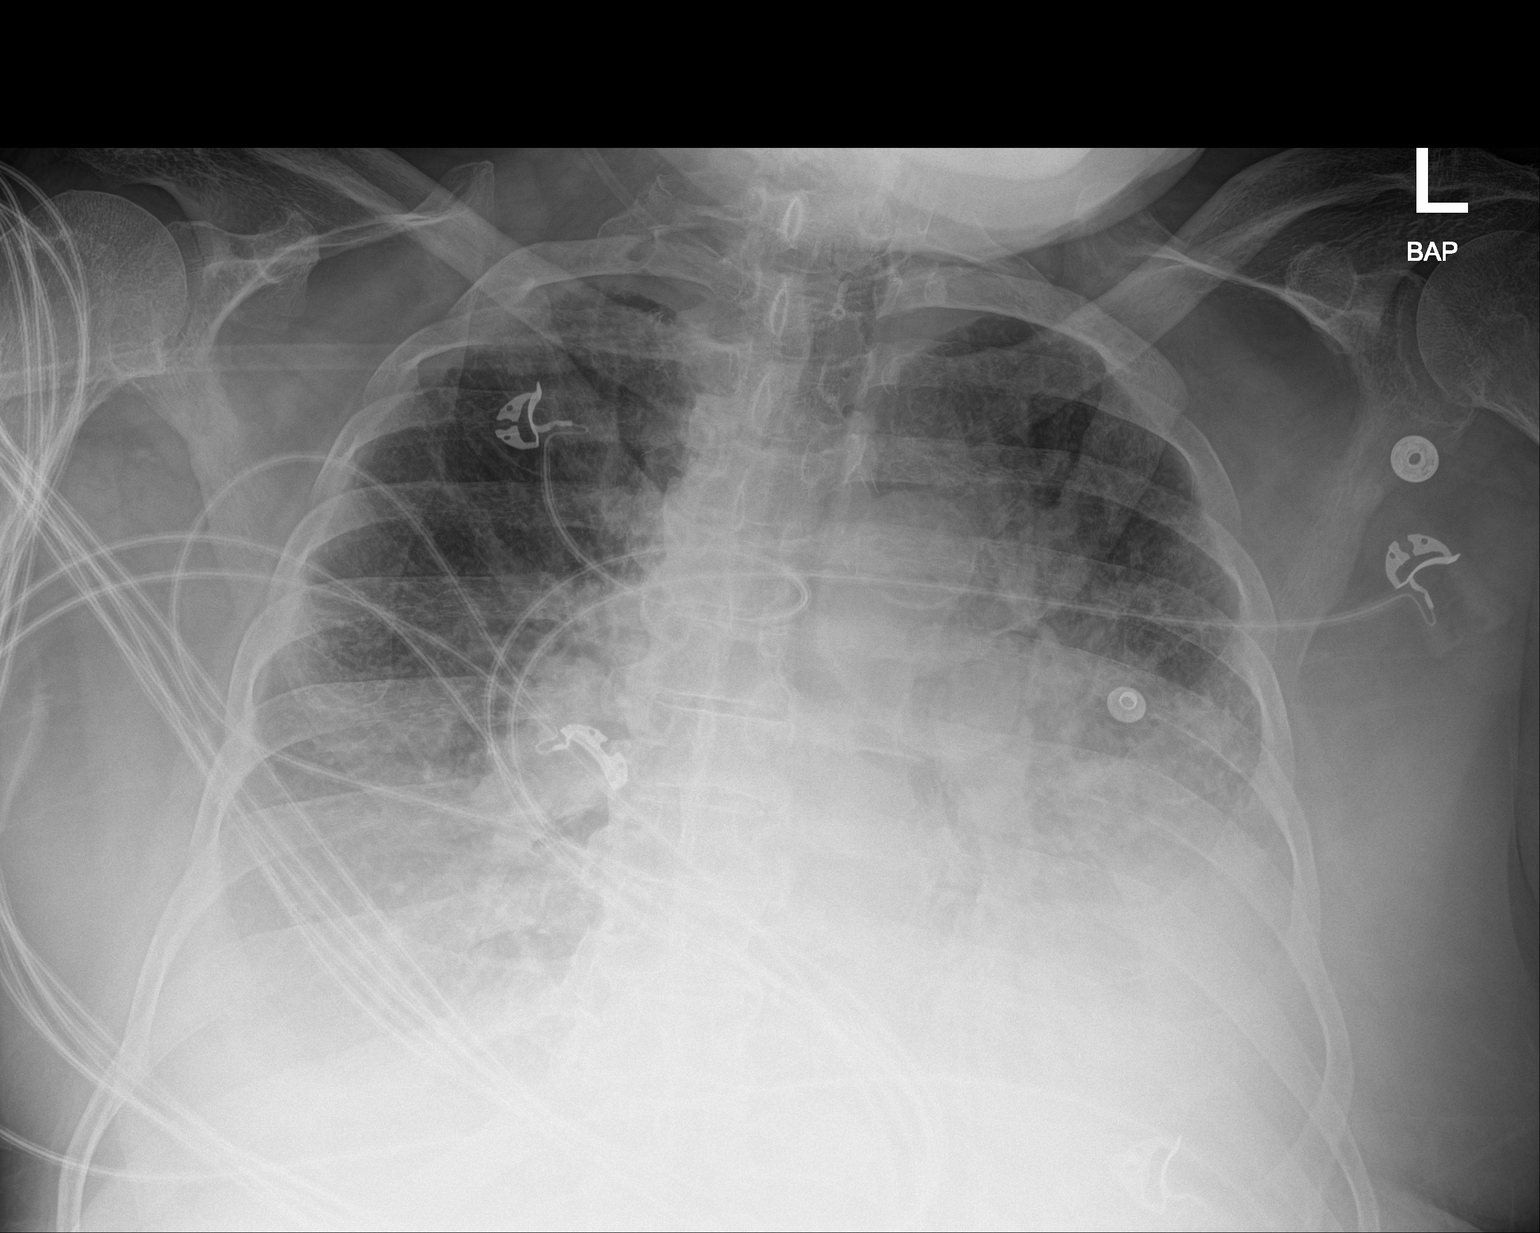

[1 of 1 positions shown; findings below may reference images not displayed]

FINDINGS: The heart size is enlarged. There is pulmonary edema. Consolidation
of bilateral lung bases with bilateral pleural effusions are noted.
Bony structures are stable.
IMPRESSION: Congestive heart failure.

Patchy consolidation of bilateral lung bases, superimposed pneumonia
is not excluded.

Bilateral pleural effusions.

## 2021-01-11 IMAGING — CT CT ANGIO CHEST
2 of 7 series · 16 of 46 positions shown · IV contrast (APPLIED)
Comparison: Chest x-ray from same day. CT chest dated January 03, 2019.

CLINICAL DATA: Shortness of breath.

EXAM:
CT ANGIOGRAPHY CHEST WITH CONTRAST
TECHNIQUE: Multidetector CT imaging of the chest was performed using the
standard protocol during bolus administration of intravenous
contrast. Multiplanar CT image reconstructions and MIPs were
obtained to evaluate the vascular anatomy.
CONTRAST:  60mL OMNIPAQUE IOHEXOL 350 MG/ML SOLN

[Series 9: thins · axial · 0.73mm/px · z∈[+78,+309]mm · 13 of 373 slices shown]
[im 21/373  lung]
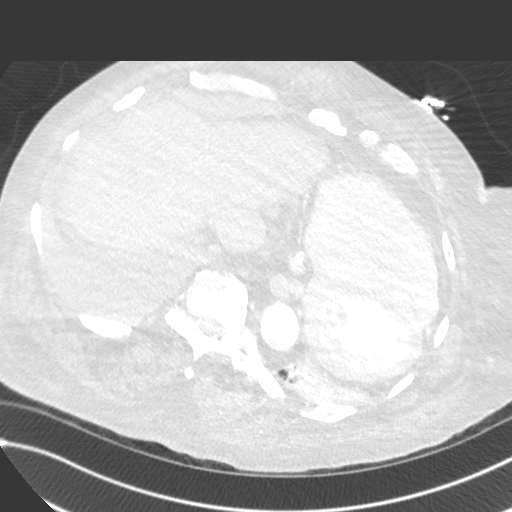
[im 42/373  soft-tissue]
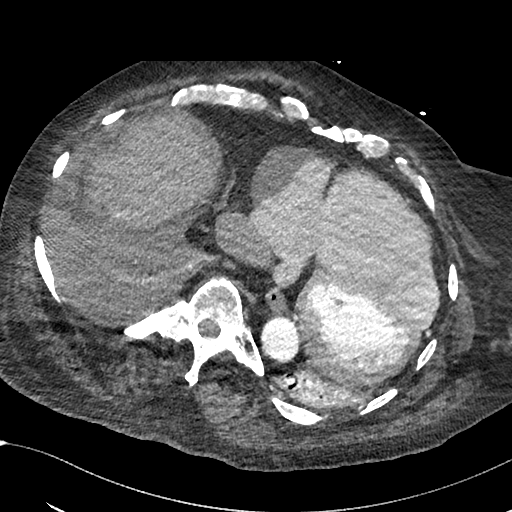
[im 83/373  lung]
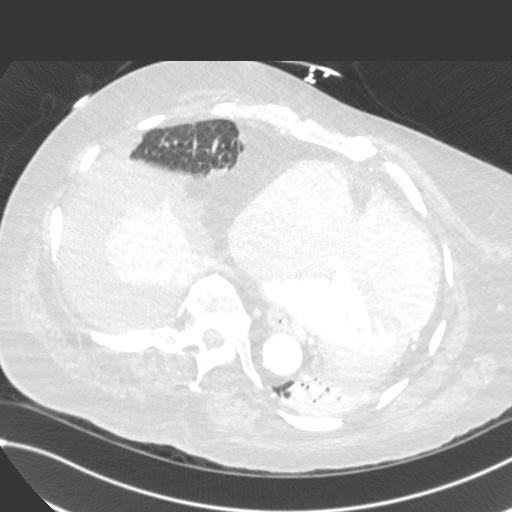
[im 104/373  soft-tissue]
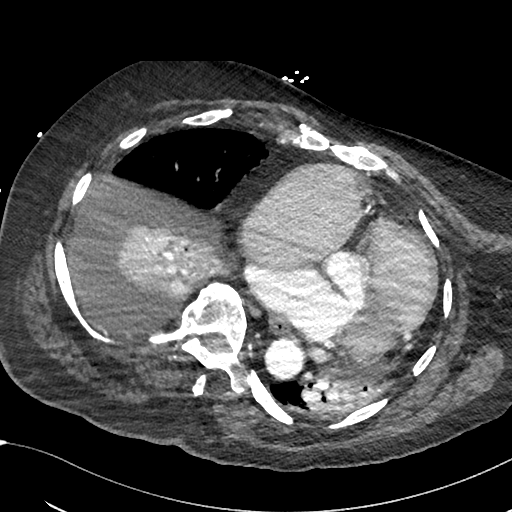
[im 125/373  lung]
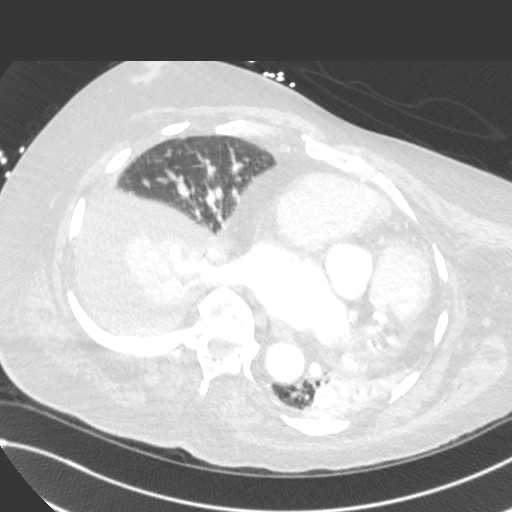
[im 166/373  soft-tissue]
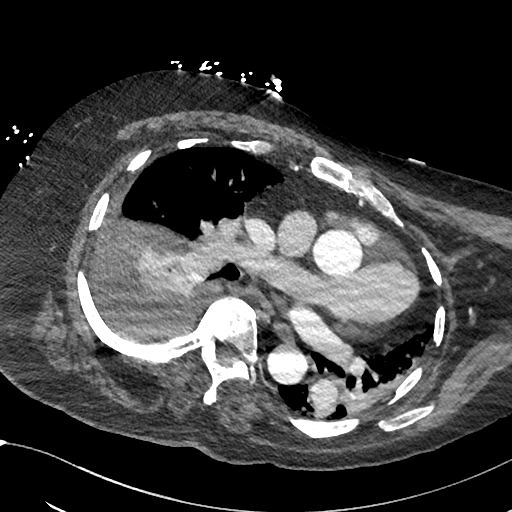
[im 187/373  lung]
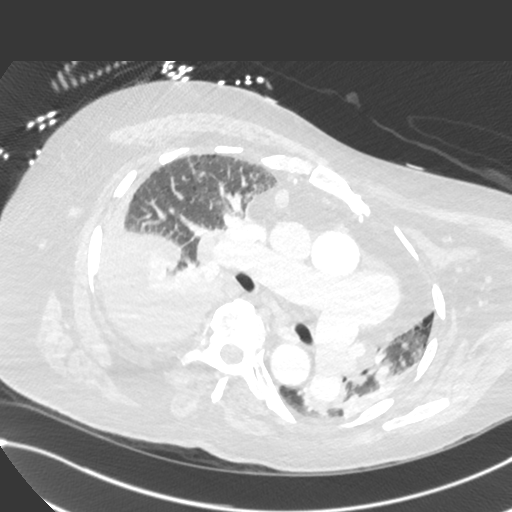
[im 207/373  soft-tissue]
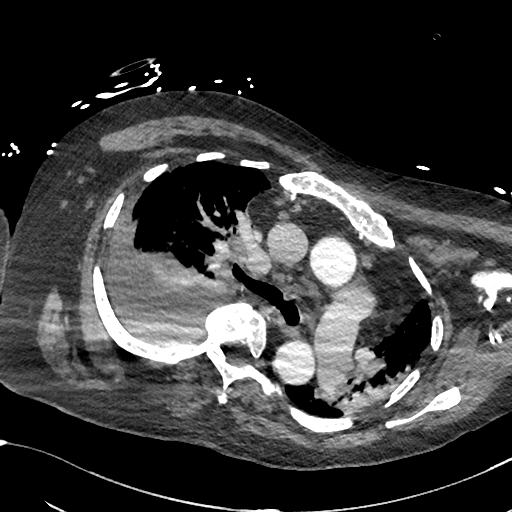
[im 249/373  lung]
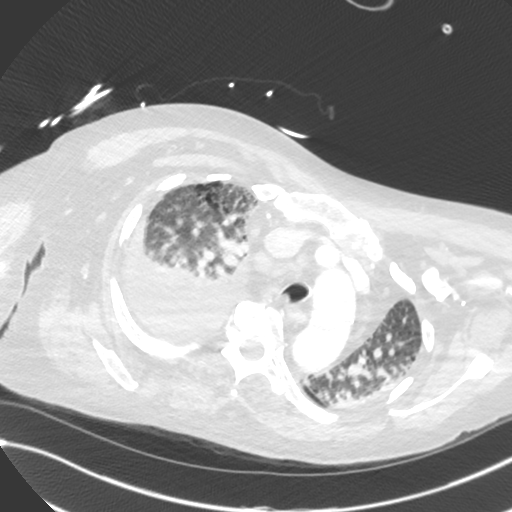
[im 269/373  soft-tissue]
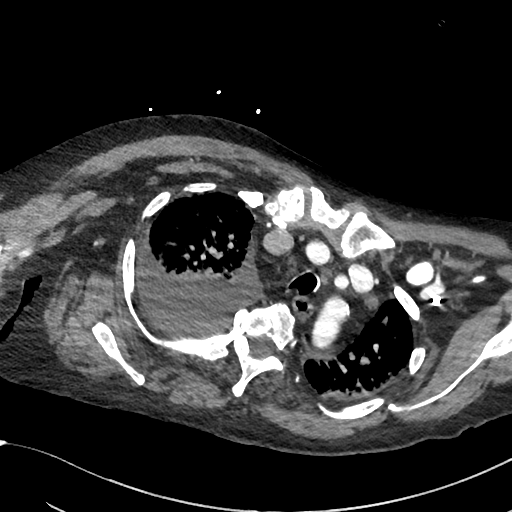
[im 290/373  lung]
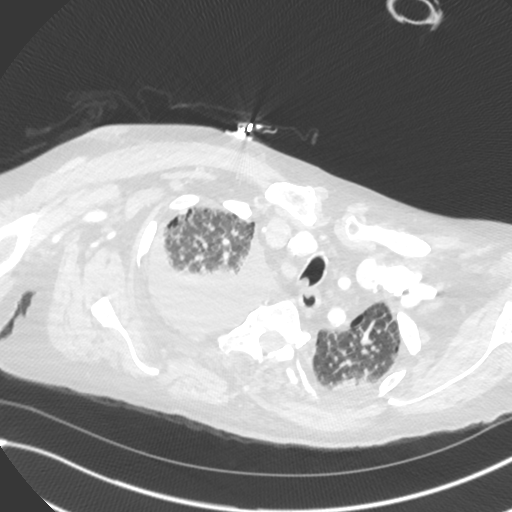
[im 331/373  soft-tissue]
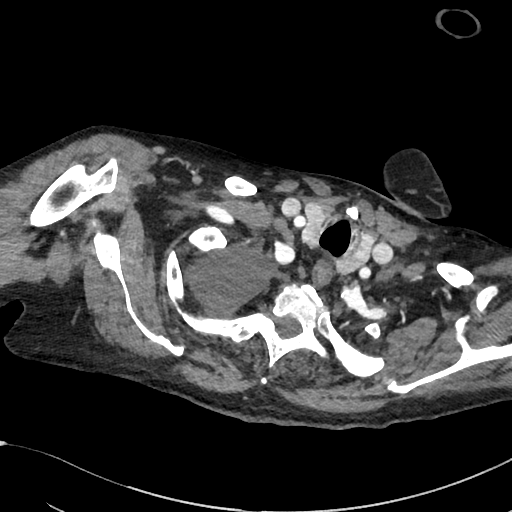
[im 352/373  lung]
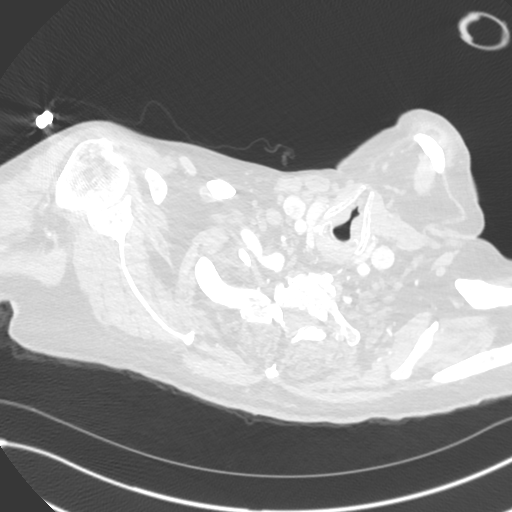

[Series 10: cor · coronal · 0.52mm/px · 3 of 148 slices shown]
[im 37/148  soft-tissue]
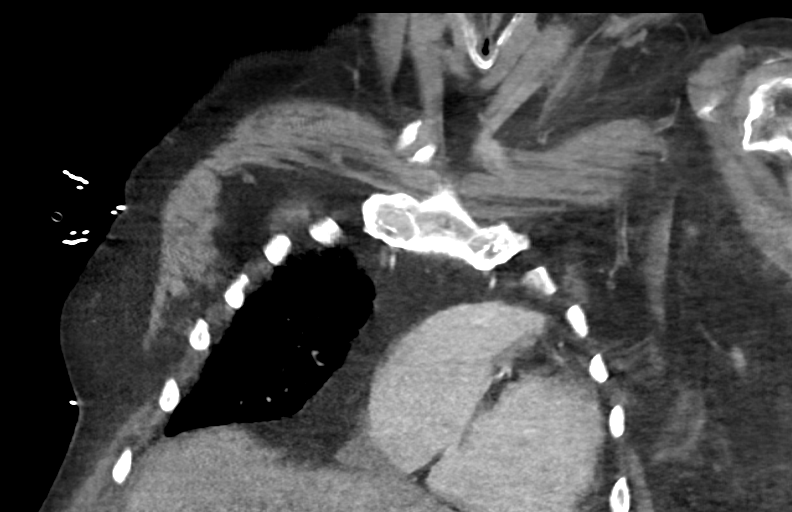
[im 74/148  soft-tissue]
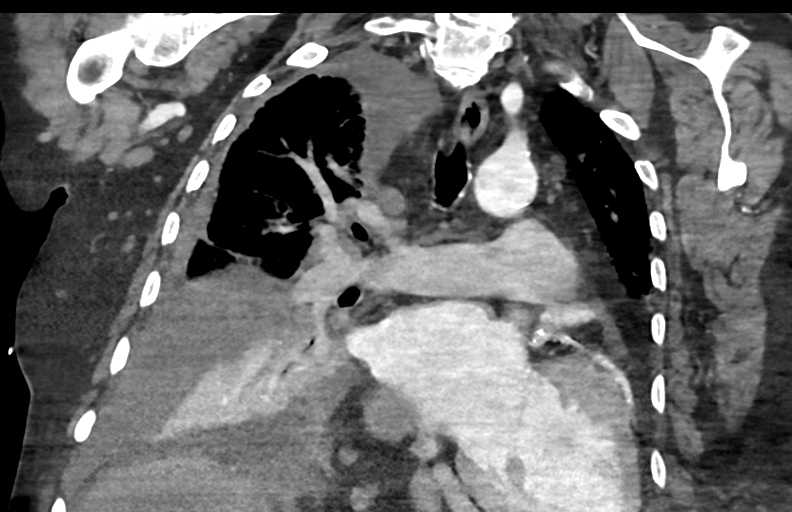
[im 111/148  soft-tissue]
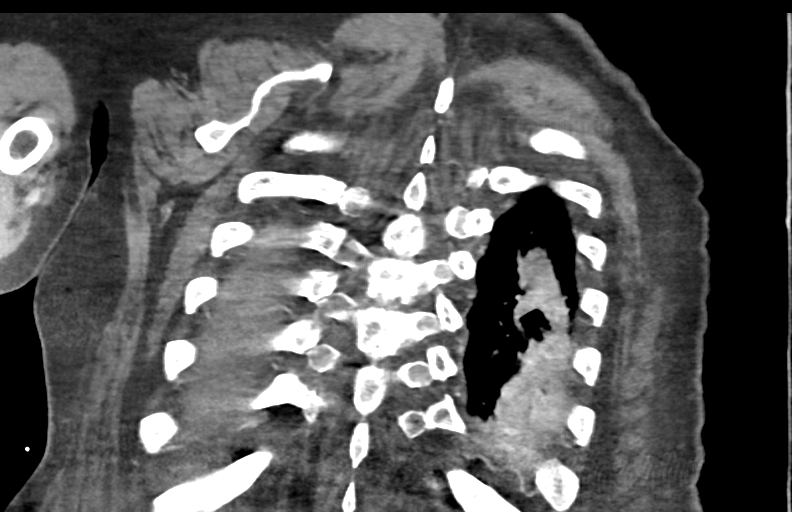

[16 of 46 positions shown; findings below may reference images not displayed]

FINDINGS: Cardiovascular: Suboptimal opacification of the pulmonary arteries.
No definite central or lobar pulmonary embolism. Unchanged
dilatation of the main pulmonary artery measuring up to 4.0 cm.
Unchanged cardiomegaly with prominent right heart enlargement. No
pericardial effusion. No thoracic aortic aneurysm or dissection.
Coronary, aortic arch, and branch vessel atherosclerotic vascular
disease.

Mediastinum/Nodes: Borderline and mildly enlarged mediastinal and
right hilar lymph nodes are similar to prior study and likely
reactive. No enlarged axillary lymph nodes. The thyroid gland,
trachea, and esophagus demonstrate no significant findings.

Lungs/Pleura: Increased now large right pleural effusion with
complete collapse of the right lower lobe. Partial collapse of the
left lower lobe. Dependent atelectasis in both upper lobes. No
consolidation or pneumothorax. Unchanged centrilobular and
paraseptal emphysema. Mild interlobular septal thickening. Scattered
bilateral pulmonary nodules measuring up to 8 mm are unchanged.

Upper Abdomen: No acute abnormality.

Musculoskeletal: No chest wall abnormality. No acute or significant
osseous findings.

Review of the MIP images confirms the above findings.
IMPRESSION: 1. Suboptimal opacification of the pulmonary arteries. No definite
central or lobar pulmonary embolism.
2. Increased now large right pleural effusion with complete collapse
of the right lower lobe. Progressive partial collapse of the left
lower lobe.
3. Unchanged cardiomegaly with evidence of pulmonary arterial
hypertension and mild interstitial pulmonary edema.
4. Scattered bilateral pulmonary nodules measuring up to 8 mm are
unchanged since January 2019. Non-contrast chest CT at 12-18 months
(from today's scan) is considered optional for low-risk patients,
but is recommended for high-risk patients. This recommendation
follows the consensus statement: Guidelines for Management of
Incidental Pulmonary Nodules Detected on CT Images: From the
5.  Emphysema (GLDWW-SHL.L).
6.  Aortic atherosclerosis (GLDWW-CQC.C).

## 2021-01-11 NOTE — Patient Instructions (Signed)
Goals    . Increase My Muscle Strength     Timeframe:  Short-Term Goal Priority:  High Start Date:   01/11/21                          Expected End Date:    03/02/21                   Follow Up Date3/31/22   - do the exercise that the therapist taught me    Why is this important?    Walking and easy exercises make you stronger. These also give you energy.   Every little bit helps, at any age.    Notes: 01/11/21 Discussed importance of participation with home health.      . Make and Keep All Appointments     Timeframe:  Long-Range Goal Priority:  High Start Date:   01/11/21                          Expected End Date:    07/03/21                   Follow Up Date 01/31/21   - keep a calendar with appointment dates    Why is this important?    Part of staying healthy is seeing the doctor for follow-up care.   If you forget your appointments, there are some things you can do to stay on track.    Notes: 01/11/21 Discussed importance of follow up appointments.    . Track and Manage Fluids and Swelling-Heart Failure     Timeframe:  Long-Range Goal Priority:  High Start Date:       01/11/21                      Expected End Date:   07/03/21                    Follow Up Date 01/31/21   - call office if I gain more than 2 pounds in one day or 5 pounds in one week - weigh myself daily    Why is this important?    It is important to check your weight daily and watch how much salt and liquids you have.   It will help you to manage your heart failure.    Notes: 01/11/21 Discussed importance of daily weights and notifying physician for increases.     . Track and Manage Symptoms-Heart Failure     Timeframe:  Long-Range Goal Priority:  High Start Date:   01/11/21                          Expected End Date:    07/03/21                   Follow Up Date 01/31/21   - follow rescue plan if symptoms flare-up - know when to call the doctor    Why is this important?    You will be able  to handle your symptoms better if you keep track of them.   Making some simple changes to your lifestyle will help.   Eating healthy is one thing you can do to take good care of yourself.    Notes: 01/11/21 Discussed importance of heart failure regimen.

## 2021-01-11 NOTE — Patient Outreach (Addendum)
[place] [facility] [facility]) Care Management  [place]  01/11/2021   Nicholas Caldwell November 08, 1945 [id number]  Subjective: Spoke with spouse Patricia(DPR).  Discussed patient and recent hospitalizations.  She states patient seems to be ok this morning.  Discussed need for in home support for monitoring.  She is aware that home health will be involved and is agreeable.  She has their contact information if they do not contact within the next day or so.    Discussed [facility] services and support to also help keep patient out of the hospital.  She is agreeable at this time for CM support.  CM contact information given.   Patient with history of heart failure,  COPD, OSA, insulin-dependent diabetes mellitus, chronic hypoxic respiratory failure, paroxysmal atrial fibrillation/flutter status post CV last month, chronic kidney disease 3B.  Patient with multiple admissions in the last 6 months. Patient has refused home services previously making medical management difficult leading to multiple hospitalizations.  Patient has follow up with [place] physician on 01/17/21 and Heart Failure clinic on 01/31/21.  Wife to transport and has contact information.  Objective:   Encounter Medications:  Outpatient Encounter Medications as of 01/11/2021  Medication Sig Note  . acetaminophen (TYLENOL) 325 MG tablet Take 2 tablets (650 mg total) by mouth every 4 (four) hours as needed for headache or mild pain.   Marland Kitchen albuterol (VENTOLIN HFA) 108 (90 Base) MCG/ACT inhaler Inhale 2 puffs into the lungs every 6 (six) hours as needed for wheezing or shortness of breath. 12/11/2020: LF 11/15/2020 90 DS  . amiodarone (PACERONE) 200 MG tablet Take 1 tablet (200 mg total) by mouth daily.   Marland Kitchen apixaban (ELIQUIS) 5 MG TABS tablet Take 1 tablet (5 mg total) by mouth 2 (two) times daily.   Marland Kitchen atorvastatin (LIPITOR) 40 MG tablet Take 1 tablet (40 mg total) by mouth daily. (Patient taking differently: Take 40 mg by mouth at bedtime.)   .  Budeson-Glycopyrrol-Formoterol (BREZTRI AEROSPHERE) 160-9-4.8 MCG/ACT AERO Inhale 2 puffs into the lungs 2 (two) times daily.   . carbamide peroxide (DEBROX) 6.5 % OTIC solution Place 5-10 drops into both ears 3 (three) times daily as needed (ear wax). 12/11/2020: LF 11/15/2020  . Carboxymethylcellulose Sod PF 0.25 % SOLN Place 1 drop into both eyes 2 (two) times daily as needed (dry eyes). 12/11/2020: LF 11/15/2020 30 DS  . Cholecalciferol (VITAMIN D) 50 MCG (2000 UT) CAPS Take 2,000 Units by mouth daily. 12/11/2020: LF 10/10/2020 90 DS  . famotidine (PEPCID) 20 MG tablet TAKE 1 TABLET(20 MG) BY MOUTH TWICE DAILY (Patient taking differently: Take 20 mg by mouth 2 (two) times daily as needed for indigestion.)   . feeding supplement, GLUCERNA SHAKE, (GLUCERNA SHAKE) LIQD Take 237 mLs by mouth 3 (three) times daily between meals.   . ferrous sulfate 325 (65 FE) MG tablet Take 325 mg by mouth every Monday, Wednesday, and Friday at 6 PM.   . fluticasone (FLONASE) 50 MCG/ACT nasal spray Place 2 sprays into both nostrils daily as needed for allergies.   . hydrocortisone (ANUSOL-HC) 2.5 % rectal cream Place 1 application rectally 2 (two) times daily as needed for hemorrhoids or itching.  12/11/2020: LF 11/15/2020 30 DS  . insulin aspart protamine- aspart (NOVOLOG MIX 70/30) (70-30) 100 UNIT/ML injection Inject 10 Units into the skin 2 (two) times daily with a meal.   . Insulin Syringe 27G X 1/2" 0.5 ML MISC 100 Syringes by Does not apply route 3 (three) times daily.   Marland Kitchen  ipratropium-albuterol (DUONEB) 0.5-2.5 (3) MG/3ML SOLN Take 3 mLs by nebulization 4 (four) times daily as needed. 12/11/2020: LF 11/15/2020  . latanoprost (XALATAN) 0.005 % ophthalmic solution Place 1 drop into both eyes at bedtime. 12/11/2020: LF 11/15/2020 90 DS  . lidocaine (LIDODERM) 5 % Place 1 patch onto the skin daily. Remove & Discard patch within 12 hours or as directed by MD   . Nystatin (GERHARDT'S BUTT CREAM) CREA Apply 1 application topically  2 (two) times daily.   Marland Kitchen oxybutynin (DITROPAN) 5 MG tablet Take 1 tablet by mouth 4 (four) times daily as needed for incontinence. 12/11/2020: LF 11/15/2020 30 DS  . oxyCODONE-acetaminophen (PERCOCET) 10-325 MG tablet Take 1 tablet by mouth every 6 (six) hours as needed for pain. 12/11/2020: PMP: Narcotic score 511; Sedative score 461; Overdose risk 370; LF [facility] on 11/12/2020 #150/30 DS  . OXYGEN Inhale 3 L/min into the lungs continuous.   . pantoprazole (PROTONIX) 40 MG tablet Take 40 mg by mouth 2 (two) times daily. 12/11/2020: LF 11/15/2020 90 DS  . polyethylene glycol powder (GLYCOLAX/MIRALAX) 17 GM/SCOOP powder Take 17 g by mouth daily as needed for constipation.   . potassium chloride SA (KLOR-CON) 20 MEQ tablet Take 1 tablet (20 mEq total) by mouth 2 (two) times daily.   Marland Kitchen senna-docusate (SENOKOT-S) 8.6-50 MG tablet Take 1 tablet by mouth at bedtime as needed for mild constipation.   . tamsulosin (FLOMAX) 0.4 MG CAPS capsule Take 1 capsule (0.4 mg total) by mouth daily. (Patient taking differently: Take 0.4 mg by mouth daily after breakfast.)   . terbinafine (LAMISIL) 1 % cream Apply 1 application topically daily. 12/11/2020: Groin for 4 weeks  . torsemide 40 MG TABS Take 80 mg by mouth 2 (two) times daily.   Marland Kitchen umeclidinium bromide (INCRUSE ELLIPTA) 62.5 MCG/INH AEPB Inhale 1 puff into the lungs daily. (Patient not taking: Reported on 01/11/2021)    No facility-administered encounter medications on file as of 01/11/2021.    Functional Status:  In your present state of health, do you have any difficulty performing the following activities: 01/11/2021 01/06/2021  Hearing? N -  Vision? N -  Difficulty concentrating or making decisions? N -  Walking or climbing stairs? Y -  Comment weakness -  Dressing or bathing? Y -  Comment wife assists -  Doing errands, shopping? Y N  Comment wife assists -  Conservation officer, nature and eating ? N -  Using the Toilet? N -  In the past six months, have you  accidently leaked urine? N -  Do you have problems with loss of bowel control? N -  Managing your Medications? Y -  Comment wife assists -  Managing your Finances? Y -  Comment wife assists -  Housekeeping or managing your Housekeeping? Y -  Comment wife assists -  Some recent data might be hidden    Fall/Depression Screening: Fall Risk  01/11/2021 12/26/2016  Falls in the past year? 0 Yes  Number falls in past yr: - 2 or more  Injury with Fall? - Yes  Risk Factor Category  - High Fall Risk  Risk for fall due to : - History of fall(s);Impaired balance/gait;Impaired mobility;Impaired vision  Follow up - Follow up appointment   [facility] 2/9 Scores 01/11/2021 12/26/2016  PHQ - 2 Score - 0  Exception Documentation Other- indicate reason in comment box -  Not completed spoke -    Assessment:  Goals Addressed  This Visit's Progress   . Increase My Muscle Strength       Timeframe:  Short-Term Goal Priority:  High Start Date:   01/11/21                          Expected End Date:    03/02/21                   Follow Up Date3/31/22   - do the exercise that the therapist taught me    Why is this important?    Walking and easy exercises make you stronger. These also give you energy.   Every little bit helps, at any age.    Notes: 01/11/21 Discussed importance of participation with home health.      . Make and Keep All Appointments       Timeframe:  Long-Range Goal Priority:  High Start Date:   01/11/21                          Expected End Date:    07/03/21                   Follow Up Date 01/31/21   - keep a calendar with appointment dates    Why is this important?    Part of staying healthy is seeing the doctor for follow-up care.   If you forget your appointments, there are some things you can do to stay on track.    Notes: 01/11/21 Discussed importance of follow up appointments.    . Track and Manage Fluids and Swelling-Heart Failure       Timeframe:   Long-Range Goal Priority:  High Start Date:       01/11/21                      Expected End Date:   07/03/21                    Follow Up Date 01/31/21   - call office if I gain more than 2 pounds in one day or 5 pounds in one week - weigh myself daily    Why is this important?    It is important to check your weight daily and watch how much salt and liquids you have.   It will help you to manage your heart failure.    Notes: 01/11/21 Discussed importance of daily weights and notifying physician for increases.     . Track and Manage Symptoms-Heart Failure       Timeframe:  Long-Range Goal Priority:  High Start Date:   01/11/21                          Expected End Date:    07/03/21                   Follow Up Date 01/31/21   - follow rescue plan if symptoms flare-up - know when to call the doctor    Why is this important?    You will be able to handle your symptoms better if you keep track of them.   Making some simple changes to your lifestyle will help.   Eating healthy is one thing you can do to take good care of yourself.    Notes: 01/11/21 Discussed importance of heart failure regimen.  Plan: RN CM will provide ongoing education and support to caregiver/patient through phone calls.   RN CM will send welcome packet with consent to caregiver/patient.   RN CM will send initial barriers letter, assessment, and care plan to primary care physician.   RN CM will contact caregiver/patient next week and caregiver/patient agrees to next contact.   Follow-up: Caregiver/ Patient agrees to Care Plan and Follow-up.   Jone Baseman, RN, MSN [place] [facility] Care Management Coordinator Direct Line [phone] Cell [phone] Toll Free: [phone]  Fax: [phone]

## 2021-01-12 DIAGNOSIS — N1832 Chronic kidney disease, stage 3b: Secondary | ICD-10-CM | POA: Diagnosis not present

## 2021-01-12 DIAGNOSIS — D631 Anemia in chronic kidney disease: Secondary | ICD-10-CM | POA: Diagnosis not present

## 2021-01-12 DIAGNOSIS — I480 Paroxysmal atrial fibrillation: Secondary | ICD-10-CM | POA: Diagnosis not present

## 2021-01-12 DIAGNOSIS — I50811 Acute right heart failure: Secondary | ICD-10-CM | POA: Diagnosis not present

## 2021-01-12 DIAGNOSIS — I4892 Unspecified atrial flutter: Secondary | ICD-10-CM | POA: Diagnosis not present

## 2021-01-12 DIAGNOSIS — E1122 Type 2 diabetes mellitus with diabetic chronic kidney disease: Secondary | ICD-10-CM | POA: Diagnosis not present

## 2021-01-12 DIAGNOSIS — L89312 Pressure ulcer of right buttock, stage 2: Secondary | ICD-10-CM | POA: Diagnosis not present

## 2021-01-12 DIAGNOSIS — I130 Hypertensive heart and chronic kidney disease with heart failure and stage 1 through stage 4 chronic kidney disease, or unspecified chronic kidney disease: Secondary | ICD-10-CM | POA: Diagnosis not present

## 2021-01-12 DIAGNOSIS — I5043 Acute on chronic combined systolic (congestive) and diastolic (congestive) heart failure: Secondary | ICD-10-CM | POA: Diagnosis not present

## 2021-01-12 LAB — METHYLMALONIC ACID, SERUM: Methylmalonic Acid, Quantitative: 502 nmol/L — ABNORMAL HIGH (ref 0–378)

## 2021-01-13 ENCOUNTER — Encounter (HOSPITAL_COMMUNITY): Payer: Self-pay | Admitting: Cardiology

## 2021-01-13 IMAGING — DX DG CHEST 1V PORT
1 series · 1 of 1 positions shown · non-contrast
Comparison: 08/09/2019

CLINICAL DATA: Respiratory failure

EXAM:
PORTABLE CHEST 1 VIEW

[chest]
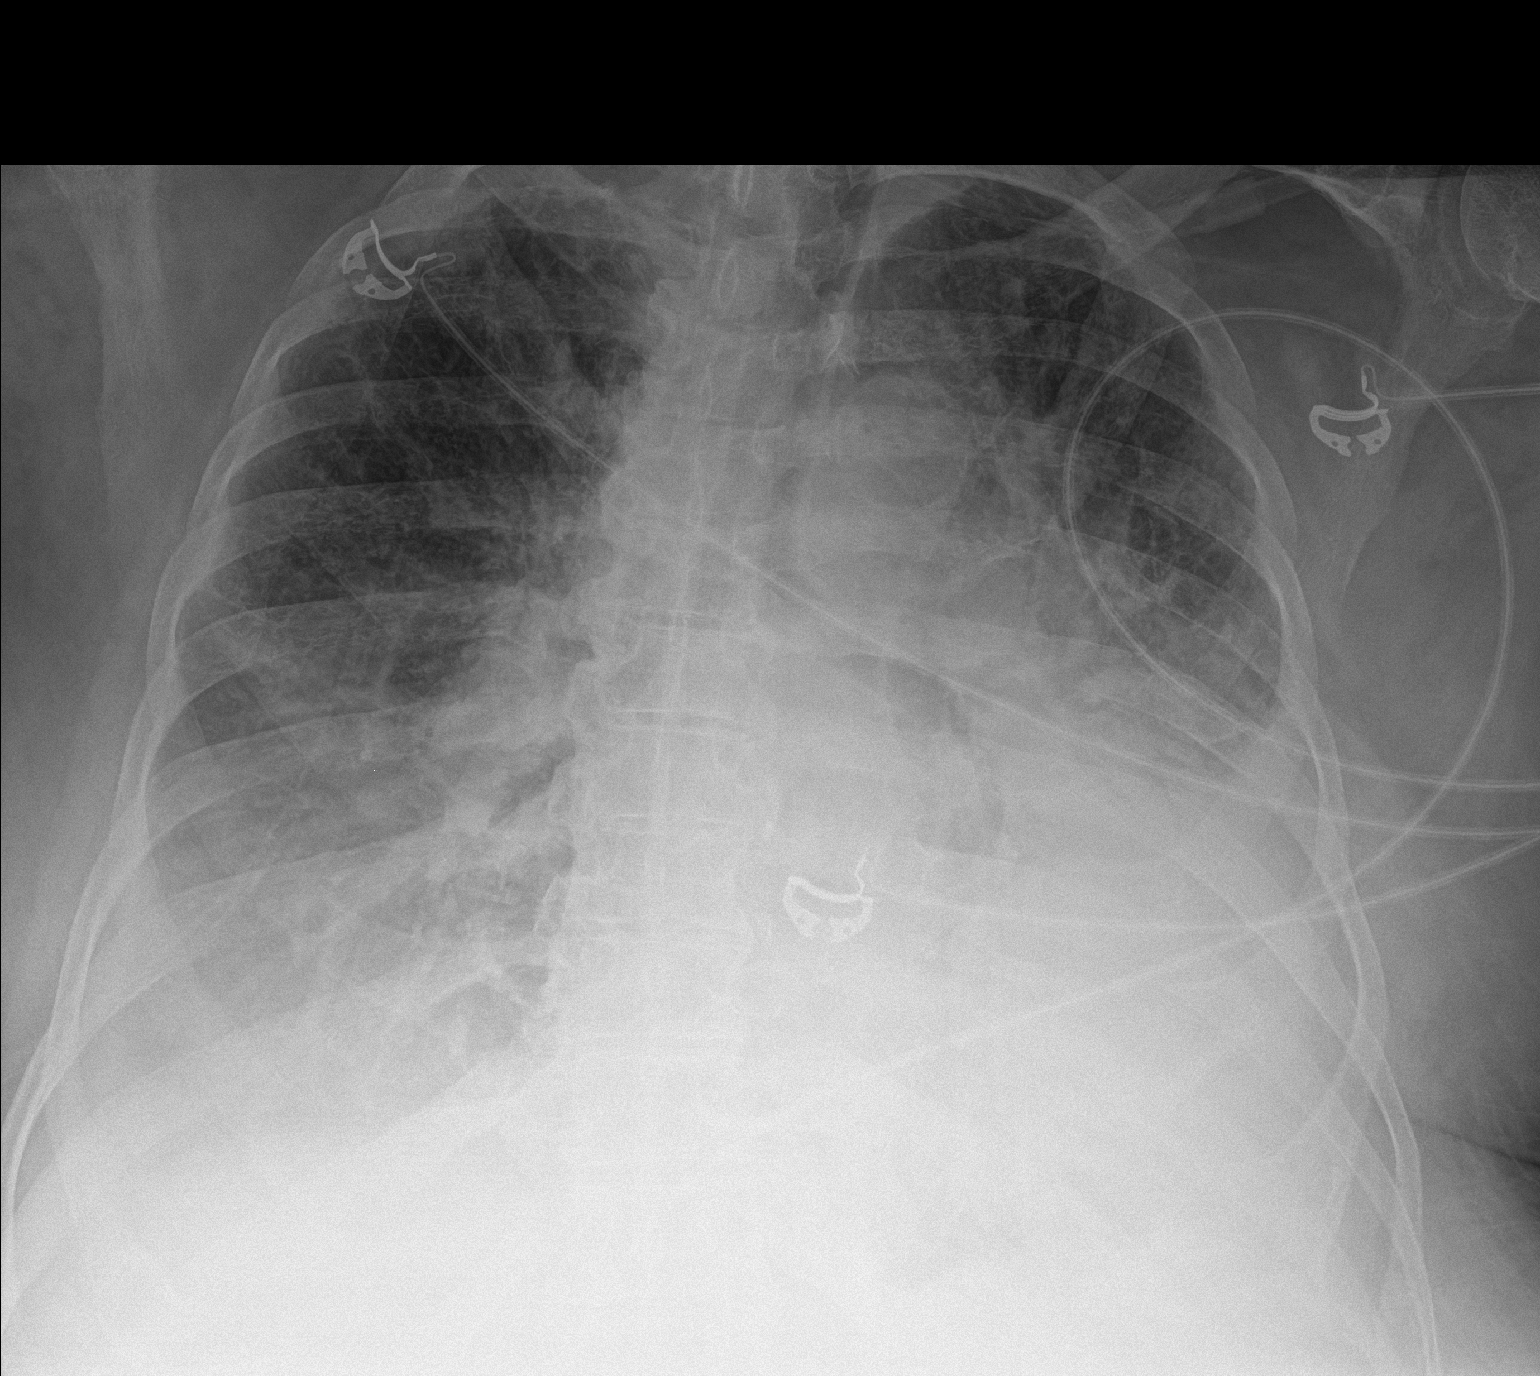

[1 of 1 positions shown; findings below may reference images not displayed]

FINDINGS: Cardiac enlargement. Severe diffuse bilateral airspace disease
without significant change. Extensive bibasilar consolidation.
Moderate right pleural effusion.
IMPRESSION: Diffuse bilateral airspace disease with little change from 2 days
ago. Probable congestive heart failure. Superimposed pneumonia not
excluded.

## 2021-01-15 DIAGNOSIS — I50811 Acute right heart failure: Secondary | ICD-10-CM | POA: Diagnosis not present

## 2021-01-15 DIAGNOSIS — L89312 Pressure ulcer of right buttock, stage 2: Secondary | ICD-10-CM | POA: Diagnosis not present

## 2021-01-15 DIAGNOSIS — I480 Paroxysmal atrial fibrillation: Secondary | ICD-10-CM | POA: Diagnosis not present

## 2021-01-15 DIAGNOSIS — I4892 Unspecified atrial flutter: Secondary | ICD-10-CM | POA: Diagnosis not present

## 2021-01-15 DIAGNOSIS — N1832 Chronic kidney disease, stage 3b: Secondary | ICD-10-CM | POA: Diagnosis not present

## 2021-01-15 DIAGNOSIS — E1122 Type 2 diabetes mellitus with diabetic chronic kidney disease: Secondary | ICD-10-CM | POA: Diagnosis not present

## 2021-01-15 DIAGNOSIS — D631 Anemia in chronic kidney disease: Secondary | ICD-10-CM | POA: Diagnosis not present

## 2021-01-15 DIAGNOSIS — I130 Hypertensive heart and chronic kidney disease with heart failure and stage 1 through stage 4 chronic kidney disease, or unspecified chronic kidney disease: Secondary | ICD-10-CM | POA: Diagnosis not present

## 2021-01-15 DIAGNOSIS — I5043 Acute on chronic combined systolic (congestive) and diastolic (congestive) heart failure: Secondary | ICD-10-CM | POA: Diagnosis not present

## 2021-01-15 IMAGING — DX DG CHEST 1V PORT
1 series · 1 of 1 positions shown · non-contrast
Comparison: Radiograph August 11, 2019.

CLINICAL DATA: Respiratory failure.

EXAM:
PORTABLE CHEST 1 VIEW

[chest ap]
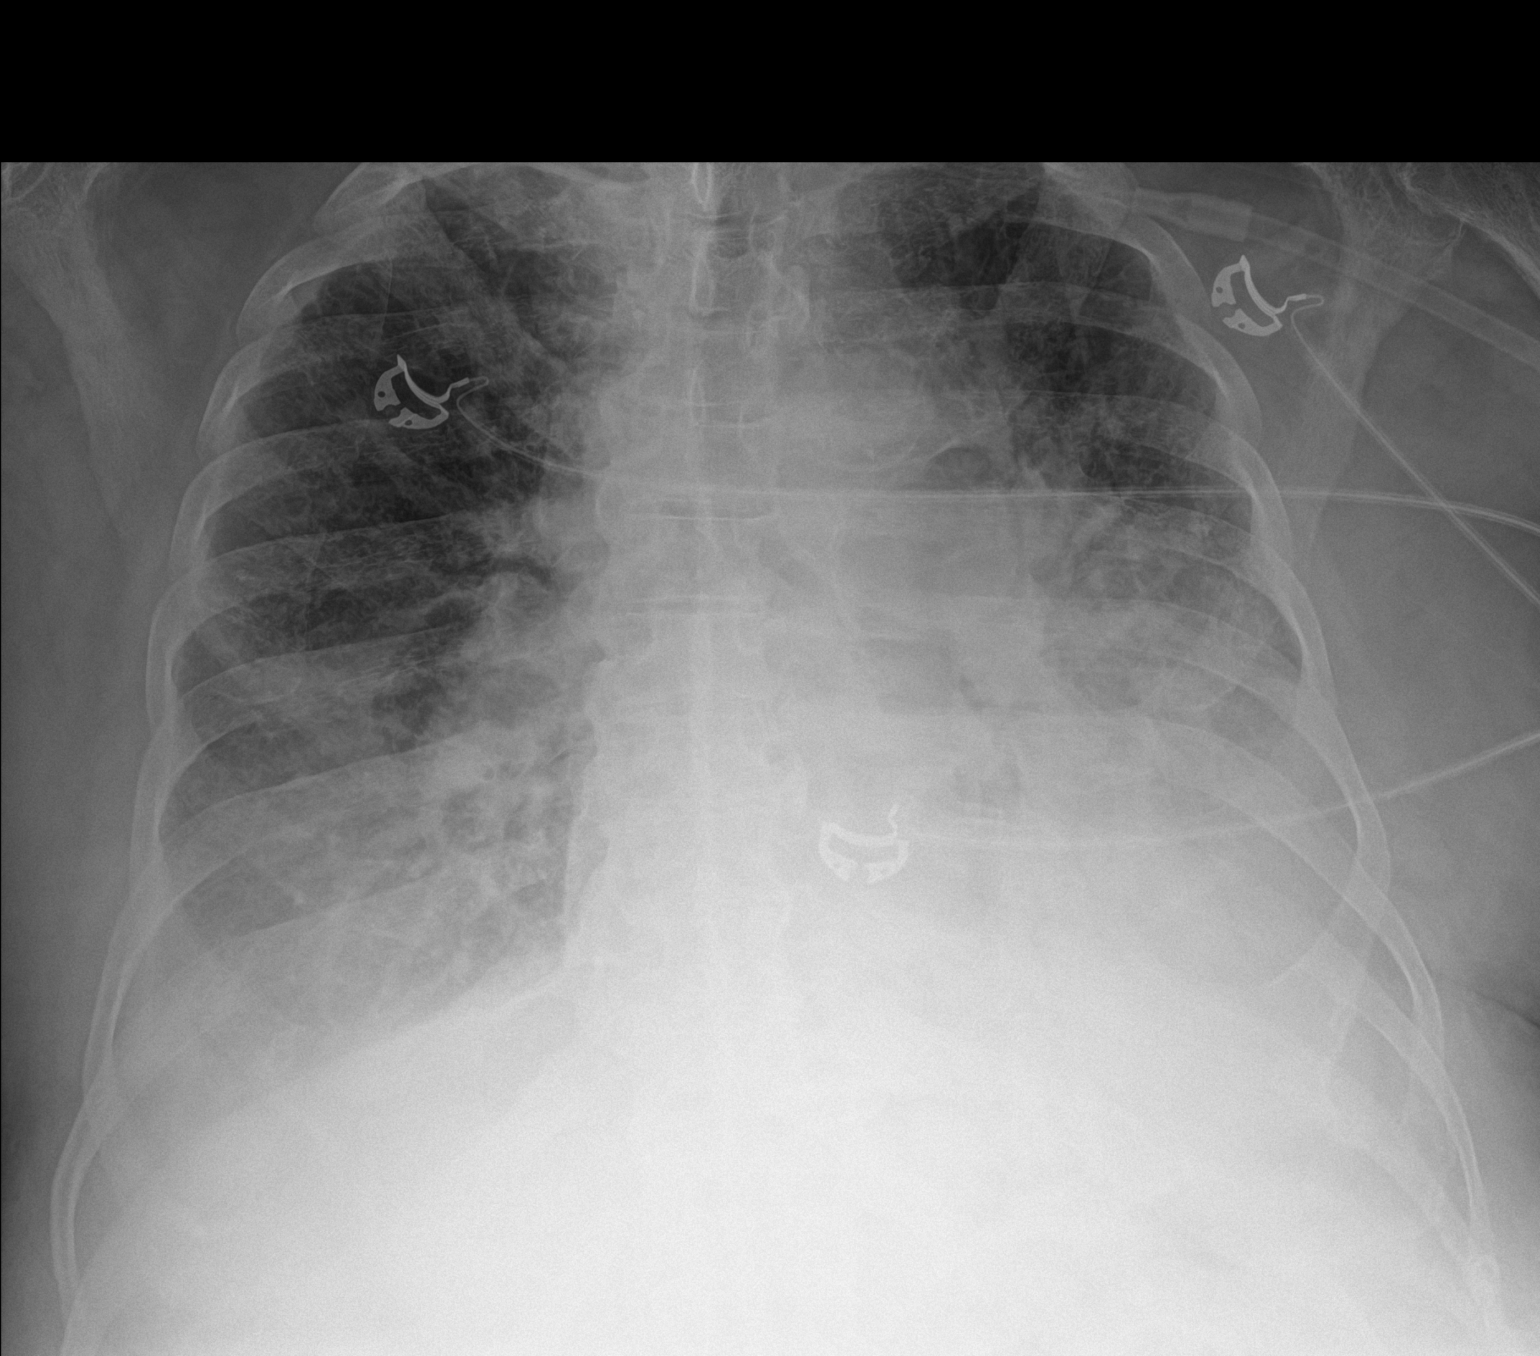

[1 of 1 positions shown; findings below may reference images not displayed]

FINDINGS: Stable cardiomegaly. Stable bilateral lung opacities are noted
concerning for edema or pneumonia. Bilateral pleural effusions are
noted. No pneumothorax is noted. Bony thorax is unremarkable.
IMPRESSION: Stable bilateral lung opacities as described above.

## 2021-01-15 IMAGING — DX DG CHEST 1V
1 series · 1 of 1 positions shown · non-contrast
Comparison: Same day.

CLINICAL DATA: Status post thoracentesis.

EXAM:
CHEST  1 VIEW

[chest pa]
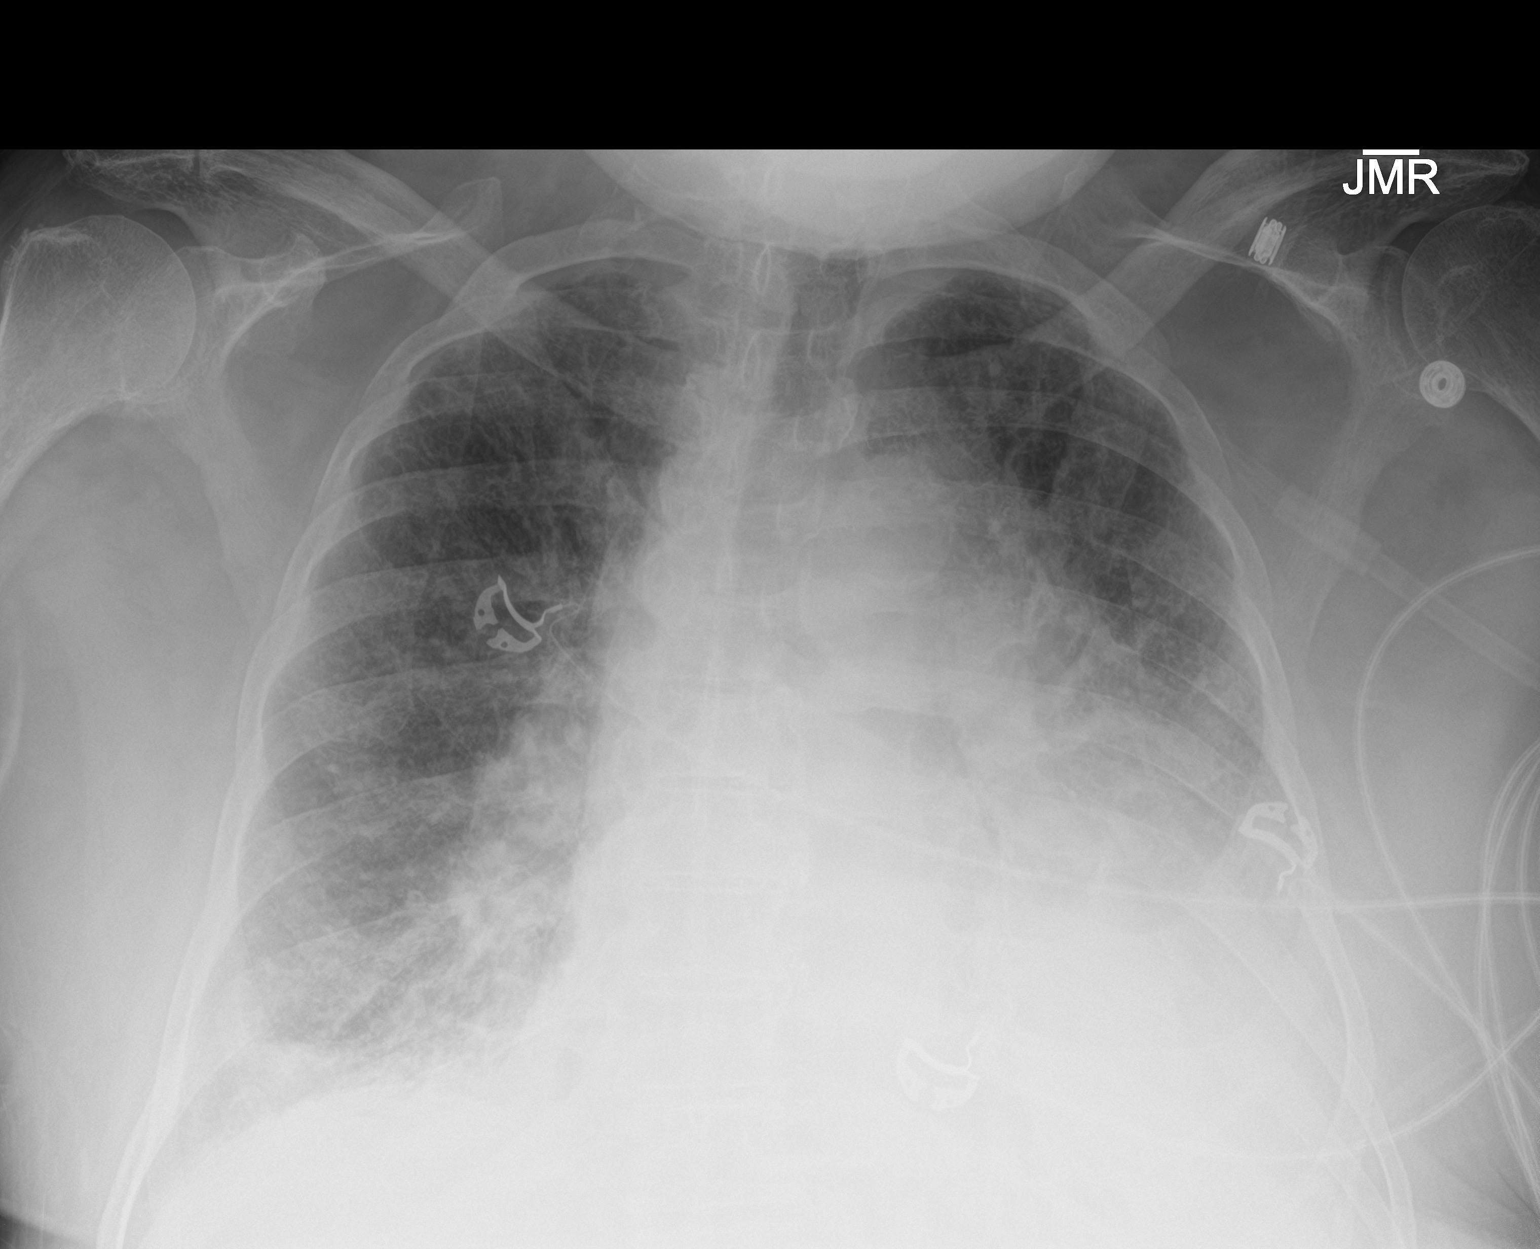

[1 of 1 positions shown; findings below may reference images not displayed]

FINDINGS: Stable cardiomegaly. No pneumothorax is noted. Stable bilateral lung
opacities are noted concerning for edema or pneumonia. Grossly
stable bilateral pleural effusions are noted. Bony thorax is
unremarkable.
IMPRESSION: Stable bilateral lung opacities and pleural effusions as described
above. No pneumothorax is noted status post thoracentesis.

## 2021-01-15 IMAGING — US US THORACENTESIS ASP PLEURAL SPACE W/IMG GUIDE
1 series · 5 of 5 positions shown · non-contrast
Comparison: none

INDICATION: Patient with history of chronic respiratory failure, recurrent
pneumonia, right pleural effusion. Request is made for diagnostic
and therapeutic thoracentesis.

[Series 1: us thoracentesis asp pleural space w/img guide · 5 of 5 slices shown]
[im 1/5]
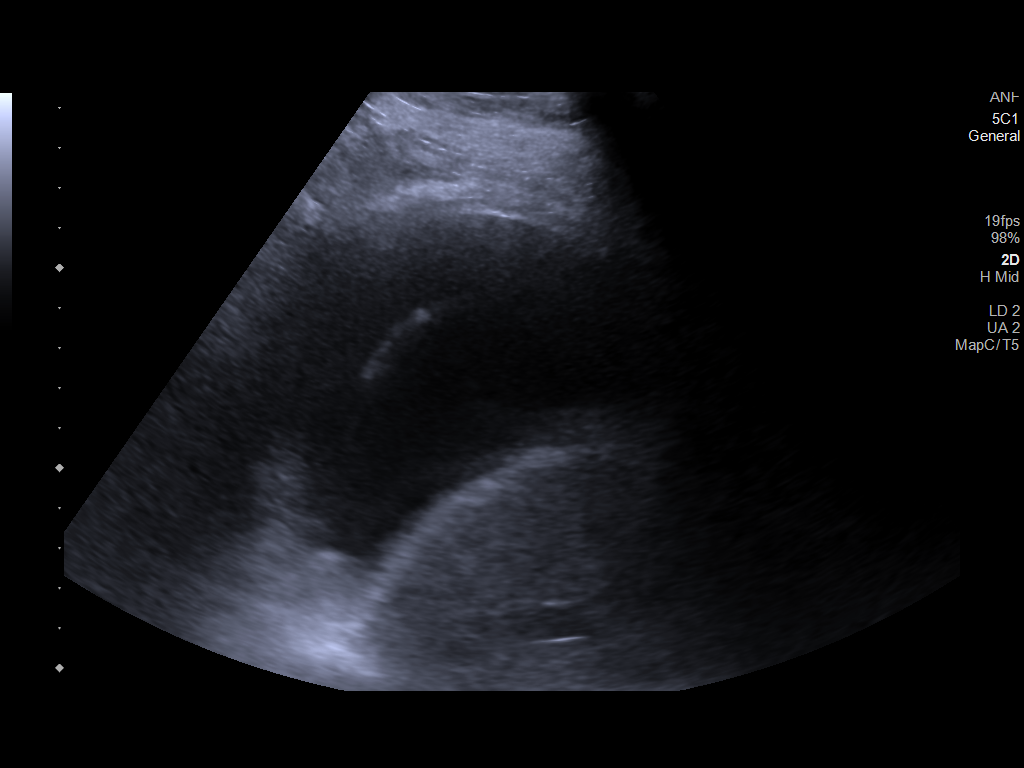
[im 2/5]
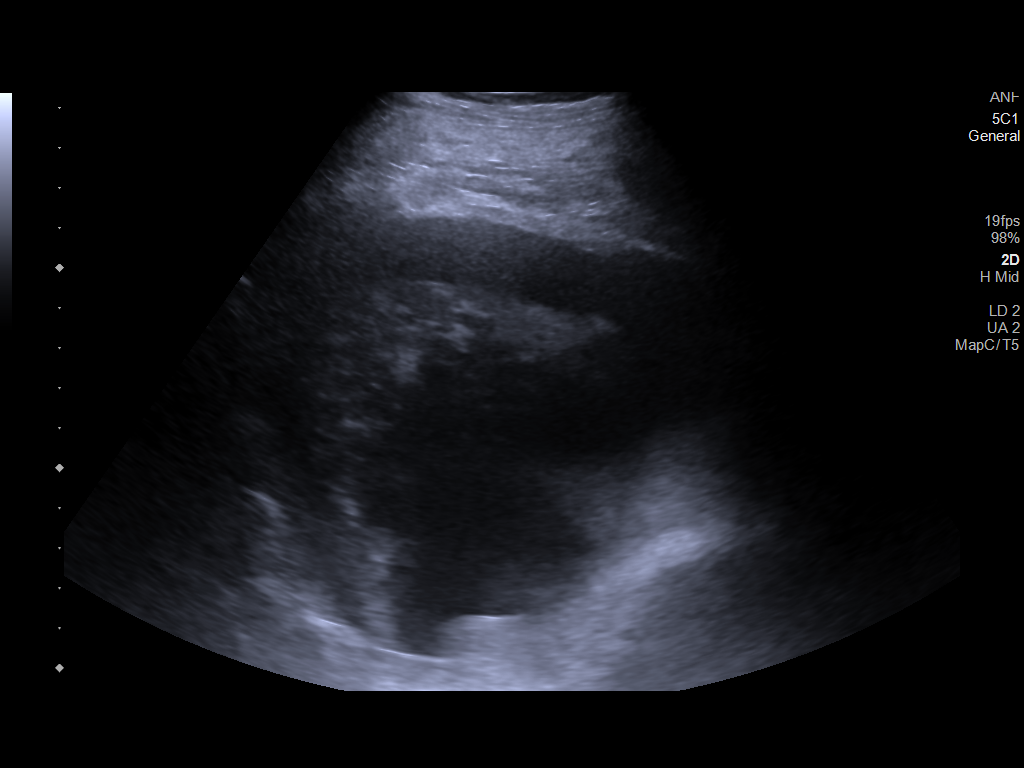
[im 3/5]
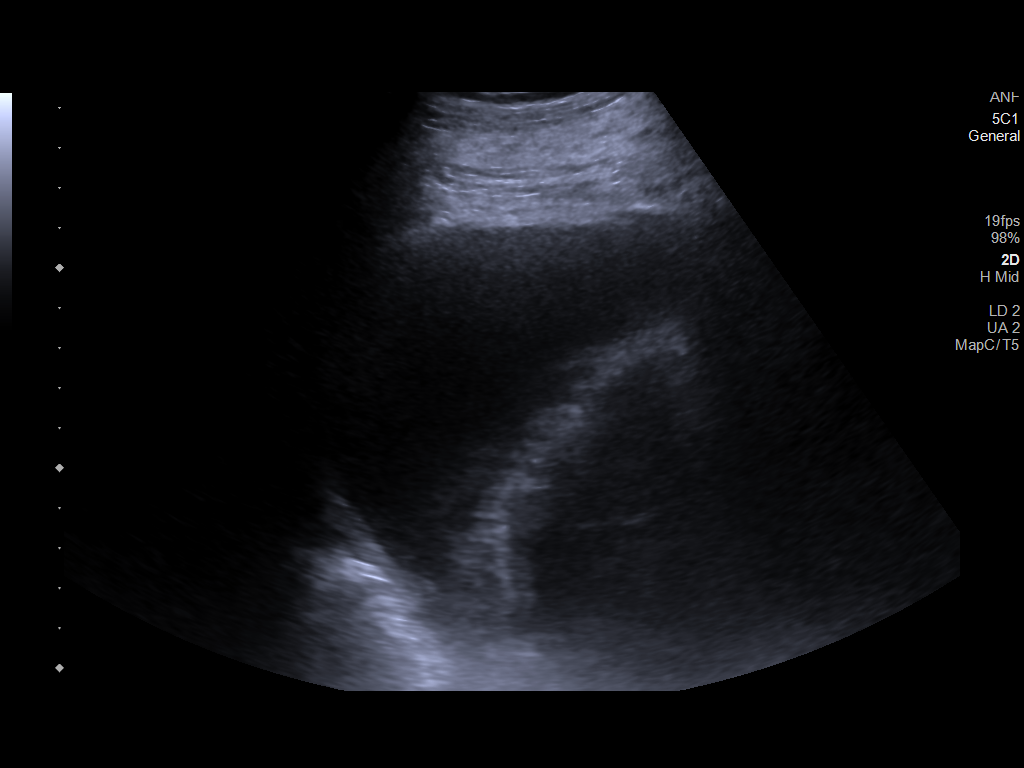
[im 4/5]
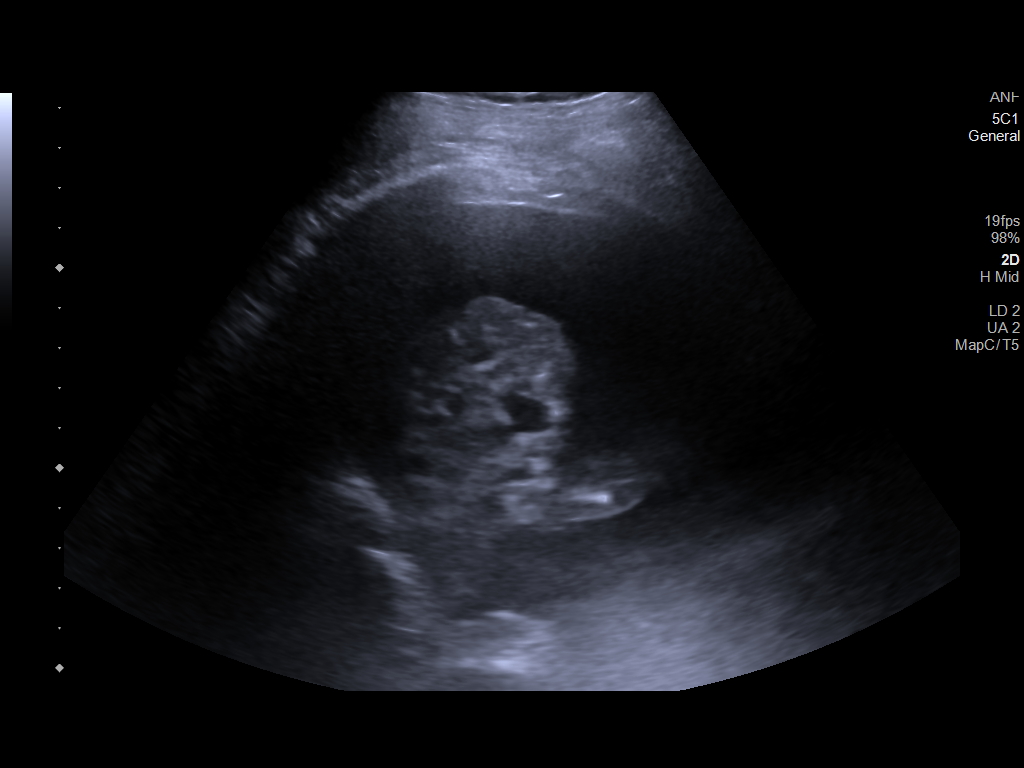
[im 5/5]
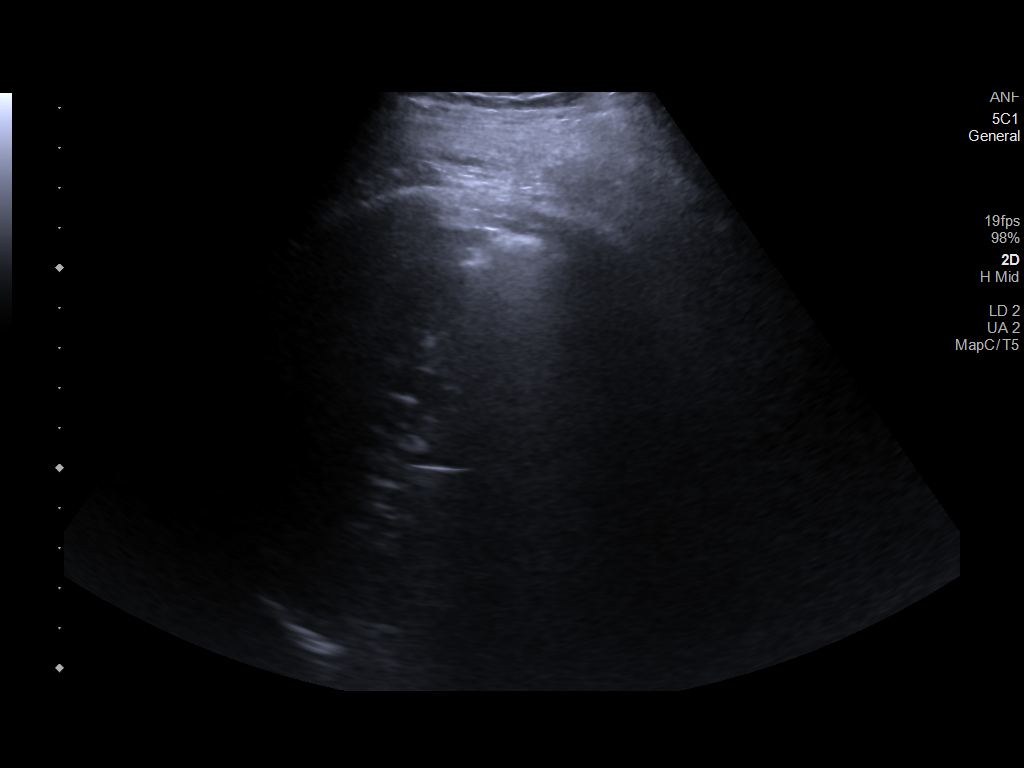

[5 of 5 positions shown; findings below may reference images not displayed]

EXAM:
ULTRASOUND GUIDED DIAGNOSTIC AND THERAPEUTIC RIGHT THORACENTESIS

MEDICATIONS:
10 mL 1% lidocaine

COMPLICATIONS:
None immediate.

PROCEDURE:
An ultrasound guided thoracentesis was thoroughly discussed with the
patient and questions answered. The benefits, risks, alternatives
and complications were also discussed. The patient understands and
wishes to proceed with the procedure. Written consent was obtained.

Ultrasound was performed to localize and mark an adequate pocket of
fluid in the right chest. The area was then prepped and draped in
the normal sterile fashion. 1% Lidocaine was used for local
anesthesia. Under ultrasound guidance a 6 Fr Safe-T-Centesis
catheter was introduced. Thoracentesis was performed. The catheter
was removed and a dressing applied.
FINDINGS: A total of approximately 1.0 liters of cloudy, pink fluid was
removed. Samples were sent to the laboratory as requested by the
clinical team.
IMPRESSION: Successful ultrasound guided diagnostic and therapeutic right
thoracentesis yielding 1.0 liters of pleural fluid.

No pneumothorax on follow-up radiograph.

## 2021-01-16 ENCOUNTER — Other Ambulatory Visit: Payer: Self-pay

## 2021-01-16 DIAGNOSIS — I4892 Unspecified atrial flutter: Secondary | ICD-10-CM | POA: Diagnosis not present

## 2021-01-16 DIAGNOSIS — I480 Paroxysmal atrial fibrillation: Secondary | ICD-10-CM | POA: Diagnosis not present

## 2021-01-16 DIAGNOSIS — N1832 Chronic kidney disease, stage 3b: Secondary | ICD-10-CM | POA: Diagnosis not present

## 2021-01-16 DIAGNOSIS — I50811 Acute right heart failure: Secondary | ICD-10-CM | POA: Diagnosis not present

## 2021-01-16 DIAGNOSIS — E1122 Type 2 diabetes mellitus with diabetic chronic kidney disease: Secondary | ICD-10-CM | POA: Diagnosis not present

## 2021-01-16 DIAGNOSIS — D631 Anemia in chronic kidney disease: Secondary | ICD-10-CM | POA: Diagnosis not present

## 2021-01-16 DIAGNOSIS — I5043 Acute on chronic combined systolic (congestive) and diastolic (congestive) heart failure: Secondary | ICD-10-CM | POA: Diagnosis not present

## 2021-01-16 DIAGNOSIS — L89312 Pressure ulcer of right buttock, stage 2: Secondary | ICD-10-CM | POA: Diagnosis not present

## 2021-01-16 DIAGNOSIS — I130 Hypertensive heart and chronic kidney disease with heart failure and stage 1 through stage 4 chronic kidney disease, or unspecified chronic kidney disease: Secondary | ICD-10-CM | POA: Diagnosis not present

## 2021-01-16 IMAGING — DX DG CHEST 1V PORT
1 series · 1 of 1 positions shown · non-contrast
Comparison: the previous day's study

CLINICAL DATA: Pleural effusion Update status

EXAM:
PORTABLE CHEST - 1 VIEW

[chest ap]
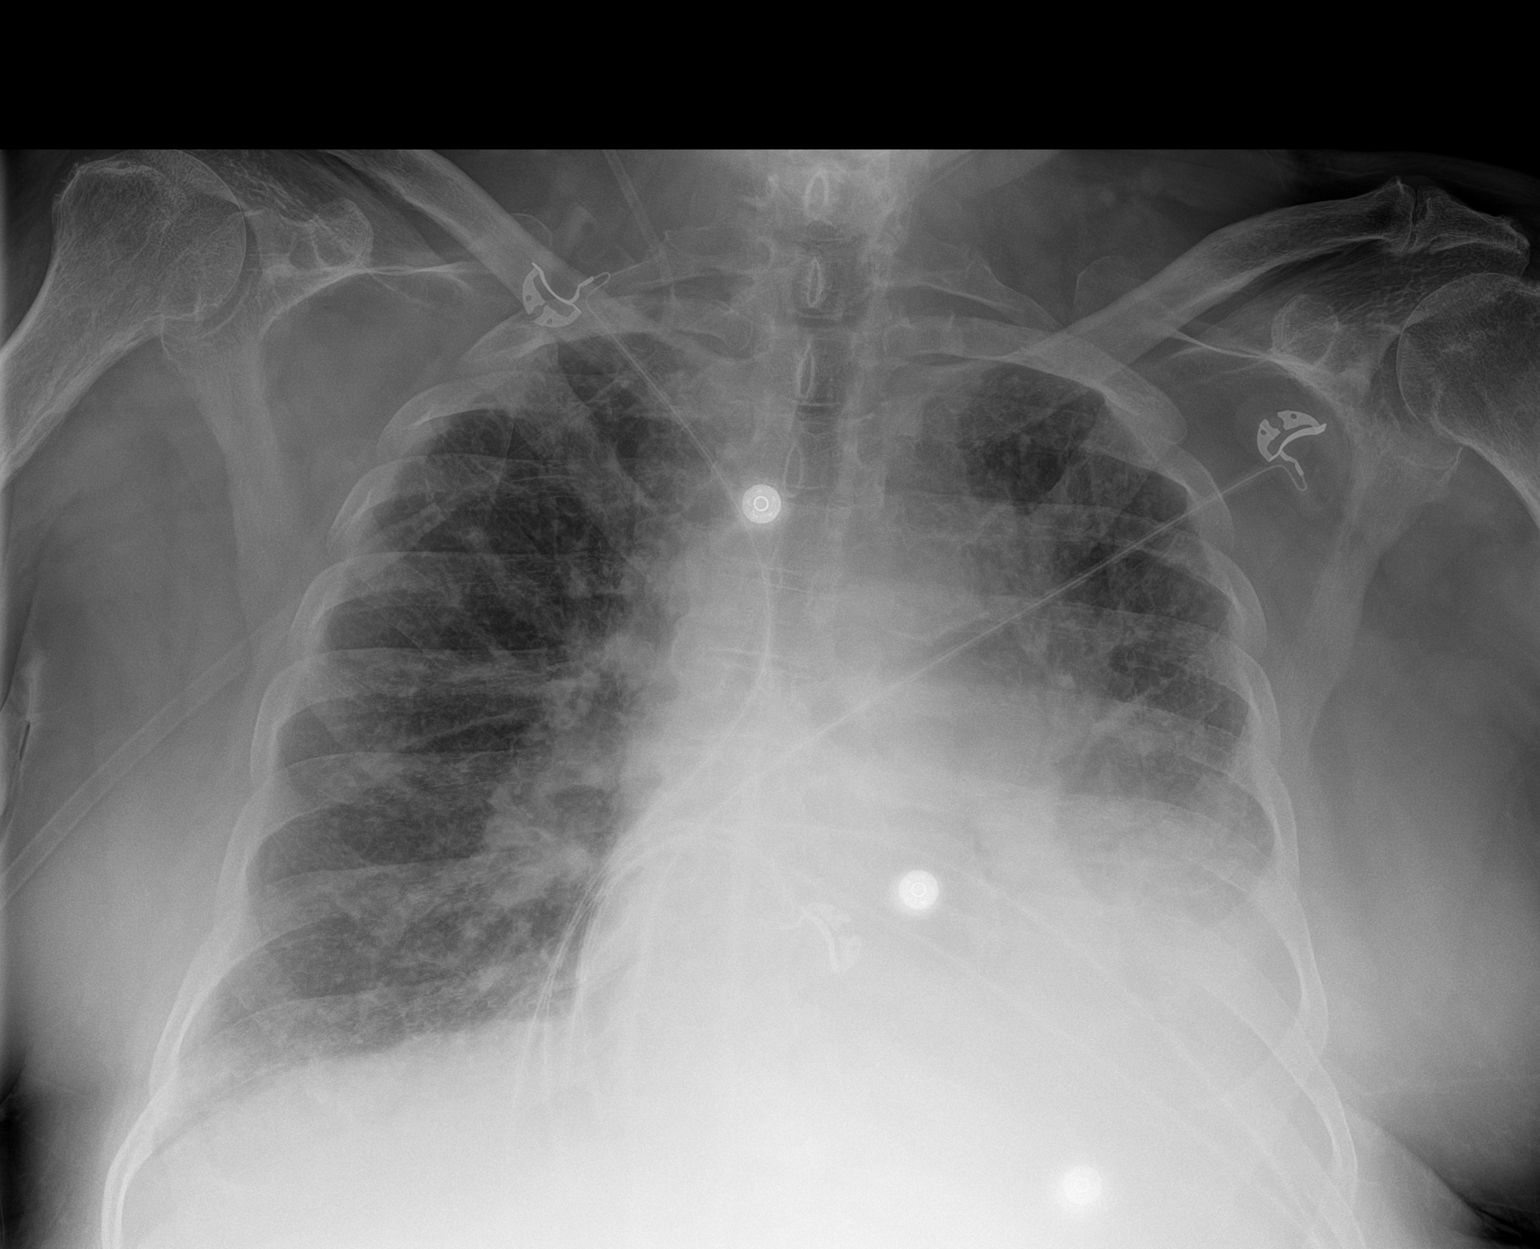

[1 of 1 positions shown; findings below may reference images not displayed]

FINDINGS: Stable left pleural effusion. Stable consolidation/atelectasis at
the left base. Bilateral interstitial edema/infiltrates may be
marginally improved.

Heart size within normal limits for portable technique. Aortic
Atherosclerosis ([id number]-170.0).

No pneumothorax. Bilateral cervical ribs incidentally noted.

DJD in bilateral AC joints.
IMPRESSION: 1. Stable left pleural effusion and left lower lobe
consolidation/atelectasis.
2. Bilateral interstitial edema/infiltrates may be marginally
improved.

## 2021-01-16 NOTE — Patient Instructions (Signed)
Goals Addressed            This Visit's Progress   . Increase My Muscle Strength   On track    Timeframe:  Short-Term Goal Priority:  High Start Date:   01/11/21                          Expected End Date:    03/02/21                   Follow Up Date3/31/22   - do the exercise that the therapist taught me    Why is this important?    Walking and easy exercises make you stronger. These also give you energy.   Every little bit helps, at any age.    Notes: 01/11/21 Discussed importance of participation with home health.   01/16/21 patient participating with therapy.    . Make and Keep All Appointments   On track    Timeframe:  Long-Range Goal Priority:  High Start Date:   01/11/21                          Expected End Date:    07/03/21                   Follow Up Date 01/31/21   - call to cancel if needed    Why is this important?    Part of staying healthy is seeing the doctor for follow-up care.   If you forget your appointments, there are some things you can do to stay on track.    Notes: 01/11/21 Discussed importance of follow up appointments. 01/16/21 patient has up coming appointments.     . Track and Manage Fluids and Swelling-Heart Failure   On track    Timeframe:  Long-Range Goal Priority:  High Start Date:       01/11/21                      Expected End Date:   07/03/21                    Follow Up Date 01/31/21   - call office if I gain more than 2 pounds in one day or 5 pounds in one week - track weight in diary - weigh myself daily    Why is this important?    It is important to check your weight daily and watch how much salt and liquids you have.   It will help you to manage your heart failure.    Notes: 01/11/21 Discussed importance of daily weights and notifying physician for increases.  01/16/21 patient weight 223 lbs. Patient adhering to guidance.     . Track and Manage Symptoms-Heart Failure   On track    Timeframe:  Long-Range Goal Priority:   High Start Date:   01/11/21                          Expected End Date:    07/03/21                   Follow Up Date 01/31/21   - follow rescue plan if symptoms flare-up - know when to call the doctor    Why is this important?    You will be able to handle your symptoms better if  you keep track of them.   Making some simple changes to your lifestyle will help.   Eating healthy is one thing you can do to take good care of yourself.    Notes: 01/11/21 Discussed importance of heart failure regimen. 01/16/21 reiterated heart failure management. Patient weight 223 lbs.

## 2021-01-16 NOTE — Patient Outreach (Signed)
[facility]) Care Management  [facility]  01/16/2021   Nicholas Caldwell 11-Mar-1946 [id number]  Subjective: Telephone call to wife Nicholas Caldwell. She states patient doing good.  She states that home health is involved.  Patient is weighing and last weight was 223 lbs.  Discussed heart failure and overall management.  She verbalized understanding.   Spoke with home health nurse with [facility] to get insight into patient at home. She states patient and wife have been very cooperative.  She is providing education and support around his CHF and COPD.   Discussed collaboration in trying to keep patient out of the hospital.  She is in agreement.     Objective:   Encounter Medications:  Outpatient Encounter Medications as of 01/16/2021  Medication Sig Note  . acetaminophen (TYLENOL) 325 MG tablet Take 2 tablets (650 mg total) by mouth every 4 (four) hours as needed for headache or mild pain.   Marland Kitchen albuterol (VENTOLIN HFA) 108 (90 Base) MCG/ACT inhaler Inhale 2 puffs into the lungs every 6 (six) hours as needed for wheezing or shortness of breath. 12/11/2020: LF 11/15/2020 90 DS  . amiodarone (PACERONE) 200 MG tablet Take 1 tablet (200 mg total) by mouth daily.   Marland Kitchen apixaban (ELIQUIS) 5 MG TABS tablet Take 1 tablet (5 mg total) by mouth 2 (two) times daily.   Marland Kitchen atorvastatin (LIPITOR) 40 MG tablet Take 1 tablet (40 mg total) by mouth daily. (Patient taking differently: Take 40 mg by mouth at bedtime.)   . Budeson-Glycopyrrol-Formoterol (BREZTRI AEROSPHERE) 160-9-4.8 MCG/ACT AERO Inhale 2 puffs into the lungs 2 (two) times daily.   . carbamide peroxide (DEBROX) 6.5 % OTIC solution Place 5-10 drops into both ears 3 (three) times daily as needed (ear wax). 12/11/2020: LF 11/15/2020  . Carboxymethylcellulose Sod PF 0.25 % SOLN Place 1 drop into both eyes 2 (two) times daily as needed (dry eyes). 12/11/2020: LF 11/15/2020 30 DS  . Cholecalciferol (VITAMIN D) 50 MCG (2000 UT) CAPS Take 2,000 Units  by mouth daily. 12/11/2020: LF 10/10/2020 90 DS  . famotidine (PEPCID) 20 MG tablet TAKE 1 TABLET(20 MG) BY MOUTH TWICE DAILY (Patient taking differently: Take 20 mg by mouth 2 (two) times daily as needed for indigestion.)   . feeding supplement, GLUCERNA SHAKE, (GLUCERNA SHAKE) LIQD Take 237 mLs by mouth 3 (three) times daily between meals.   . ferrous sulfate 325 (65 FE) MG tablet Take 325 mg by mouth every Monday, Wednesday, and Friday at 6 PM.   . fluticasone (FLONASE) 50 MCG/ACT nasal spray Place 2 sprays into both nostrils daily as needed for allergies.   . hydrocortisone (ANUSOL-HC) 2.5 % rectal cream Place 1 application rectally 2 (two) times daily as needed for hemorrhoids or itching.  12/11/2020: LF 11/15/2020 30 DS  . insulin aspart protamine- aspart (NOVOLOG MIX 70/30) (70-30) 100 UNIT/ML injection Inject 10 Units into the skin 2 (two) times daily with a meal.   . Insulin Syringe 27G X 1/2" 0.5 ML MISC 100 Syringes by Does not apply route 3 (three) times daily.   Marland Kitchen ipratropium-albuterol (DUONEB) 0.5-2.5 (3) MG/3ML SOLN Take 3 mLs by nebulization 4 (four) times daily as needed. 12/11/2020: LF 11/15/2020  . latanoprost (XALATAN) 0.005 % ophthalmic solution Place 1 drop into both eyes at bedtime. 12/11/2020: LF 11/15/2020 90 DS  . lidocaine (LIDODERM) 5 % Place 1 patch onto the skin daily. Remove & Discard patch within 12 hours or as directed by MD   . Nystatin (GERHARDT'S  BUTT CREAM) CREA Apply 1 application topically 2 (two) times daily.   Marland Kitchen oxybutynin (DITROPAN) 5 MG tablet Take 1 tablet by mouth 4 (four) times daily as needed for incontinence. 12/11/2020: LF 11/15/2020 30 DS  . oxyCODONE-acetaminophen (PERCOCET) 10-325 MG tablet Take 1 tablet by mouth every 6 (six) hours as needed for pain. 12/11/2020: PMP: Narcotic score 511; Sedative score 461; Overdose risk 370; LF [facility] on 11/12/2020 #150/30 DS  . OXYGEN Inhale 3 L/min into the lungs continuous.   . pantoprazole (PROTONIX) 40 MG tablet  Take 40 mg by mouth 2 (two) times daily. 12/11/2020: LF 11/15/2020 90 DS  . polyethylene glycol powder (GLYCOLAX/MIRALAX) 17 GM/SCOOP powder Take 17 g by mouth daily as needed for constipation.   . potassium chloride SA (KLOR-CON) 20 MEQ tablet Take 1 tablet (20 mEq total) by mouth 2 (two) times daily.   Marland Kitchen senna-docusate (SENOKOT-S) 8.6-50 MG tablet Take 1 tablet by mouth at bedtime as needed for mild constipation.   . tamsulosin (FLOMAX) 0.4 MG CAPS capsule Take 1 capsule (0.4 mg total) by mouth daily. (Patient taking differently: Take 0.4 mg by mouth daily after breakfast.)   . terbinafine (LAMISIL) 1 % cream Apply 1 application topically daily. 12/11/2020: Groin for 4 weeks  . torsemide 40 MG TABS Take 80 mg by mouth 2 (two) times daily.   Marland Kitchen umeclidinium bromide (INCRUSE ELLIPTA) 62.5 MCG/INH AEPB Inhale 1 puff into the lungs daily. (Patient not taking: Reported on 01/11/2021)    No facility-administered encounter medications on file as of 01/16/2021.    Functional Status:  In your present state of health, do you have any difficulty performing the following activities: 01/11/2021 01/06/2021  Hearing? N -  Vision? N -  Difficulty concentrating or making decisions? N -  Walking or climbing stairs? Y -  Comment weakness -  Dressing or bathing? Y -  Comment wife assists -  Doing errands, shopping? Y N  Comment wife assists -  Conservation officer, nature and eating ? N -  Using the Toilet? N -  In the past six months, have you accidently leaked urine? N -  Do you have problems with loss of bowel control? N -  Managing your Medications? Y -  Comment wife assists -  Managing your Finances? Y -  Comment wife assists -  Housekeeping or managing your Housekeeping? Y -  Comment wife assists -  Some recent data might be hidden    Fall/Depression Screening: Fall Risk  01/11/2021 12/26/2016  Falls in the past year? 0 Yes  Number falls in past yr: - 2 or more  Injury with Fall? - Yes  Risk Factor Category  -  High Fall Risk  Risk for fall due to : - History of fall(s);Impaired balance/gait;Impaired mobility;Impaired vision  Follow up - Follow up appointment   [facility] 2/9 Scores 01/11/2021 12/26/2016  PHQ - 2 Score - 0  Exception Documentation Other- indicate reason in comment box -  Not completed spoke -    Assessment:  Goals Addressed            This Visit's Progress   . Increase My Muscle Strength   On track    Timeframe:  Short-Term Goal Priority:  High Start Date:   01/11/21                          Expected End Date:    03/02/21  Follow Up Date3/31/22   - do the exercise that the therapist taught me    Why is this important?    Walking and easy exercises make you stronger. These also give you energy.   Every little bit helps, at any age.    Notes: 01/11/21 Discussed importance of participation with home health.   01/16/21 patient participating with therapy.    . Make and Keep All Appointments   On track    Timeframe:  Long-Range Goal Priority:  High Start Date:   01/11/21                          Expected End Date:    07/03/21                   Follow Up Date 01/31/21   - call to cancel if needed    Why is this important?    Part of staying healthy is seeing the doctor for follow-up care.   If you forget your appointments, there are some things you can do to stay on track.    Notes: 01/11/21 Discussed importance of follow up appointments. 01/16/21 patient has up coming appointments.     . Track and Manage Fluids and Swelling-Heart Failure   On track    Timeframe:  Long-Range Goal Priority:  High Start Date:       01/11/21                      Expected End Date:   07/03/21                    Follow Up Date 01/31/21   - call office if I gain more than 2 pounds in one day or 5 pounds in one week - track weight in diary - weigh myself daily    Why is this important?    It is important to check your weight daily and watch how much salt and liquids you  have.   It will help you to manage your heart failure.    Notes: 01/11/21 Discussed importance of daily weights and notifying physician for increases.  01/16/21 patient weight 223 lbs. Patient adhering to guidance.     . Track and Manage Symptoms-Heart Failure   On track    Timeframe:  Long-Range Goal Priority:  High Start Date:   01/11/21                          Expected End Date:    07/03/21                   Follow Up Date 01/31/21   - follow rescue plan if symptoms flare-up - know when to call the doctor    Why is this important?    You will be able to handle your symptoms better if you keep track of them.   Making some simple changes to your lifestyle will help.   Eating healthy is one thing you can do to take good care of yourself.    Notes: 01/11/21 Discussed importance of heart failure regimen. 01/16/21 reiterated heart failure management. Patient weight 223 lbs.         Plan: RN CM will contact next week for follow up. Follow-up:  Patient agrees to Care Plan and Follow-up.   Jone Baseman, RN, MSN [facility] Care Management Care Management Coordinator  Direct Line [phone] Cell [phone] Toll Free: [phone]  Fax: [phone]

## 2021-01-17 IMAGING — DX DG CHEST 1V PORT
1 series · 1 of 1 positions shown · non-contrast
Comparison: August 14, 2019.

CLINICAL DATA: Pleural effusion.

EXAM:
PORTABLE CHEST 1 VIEW

[chest]
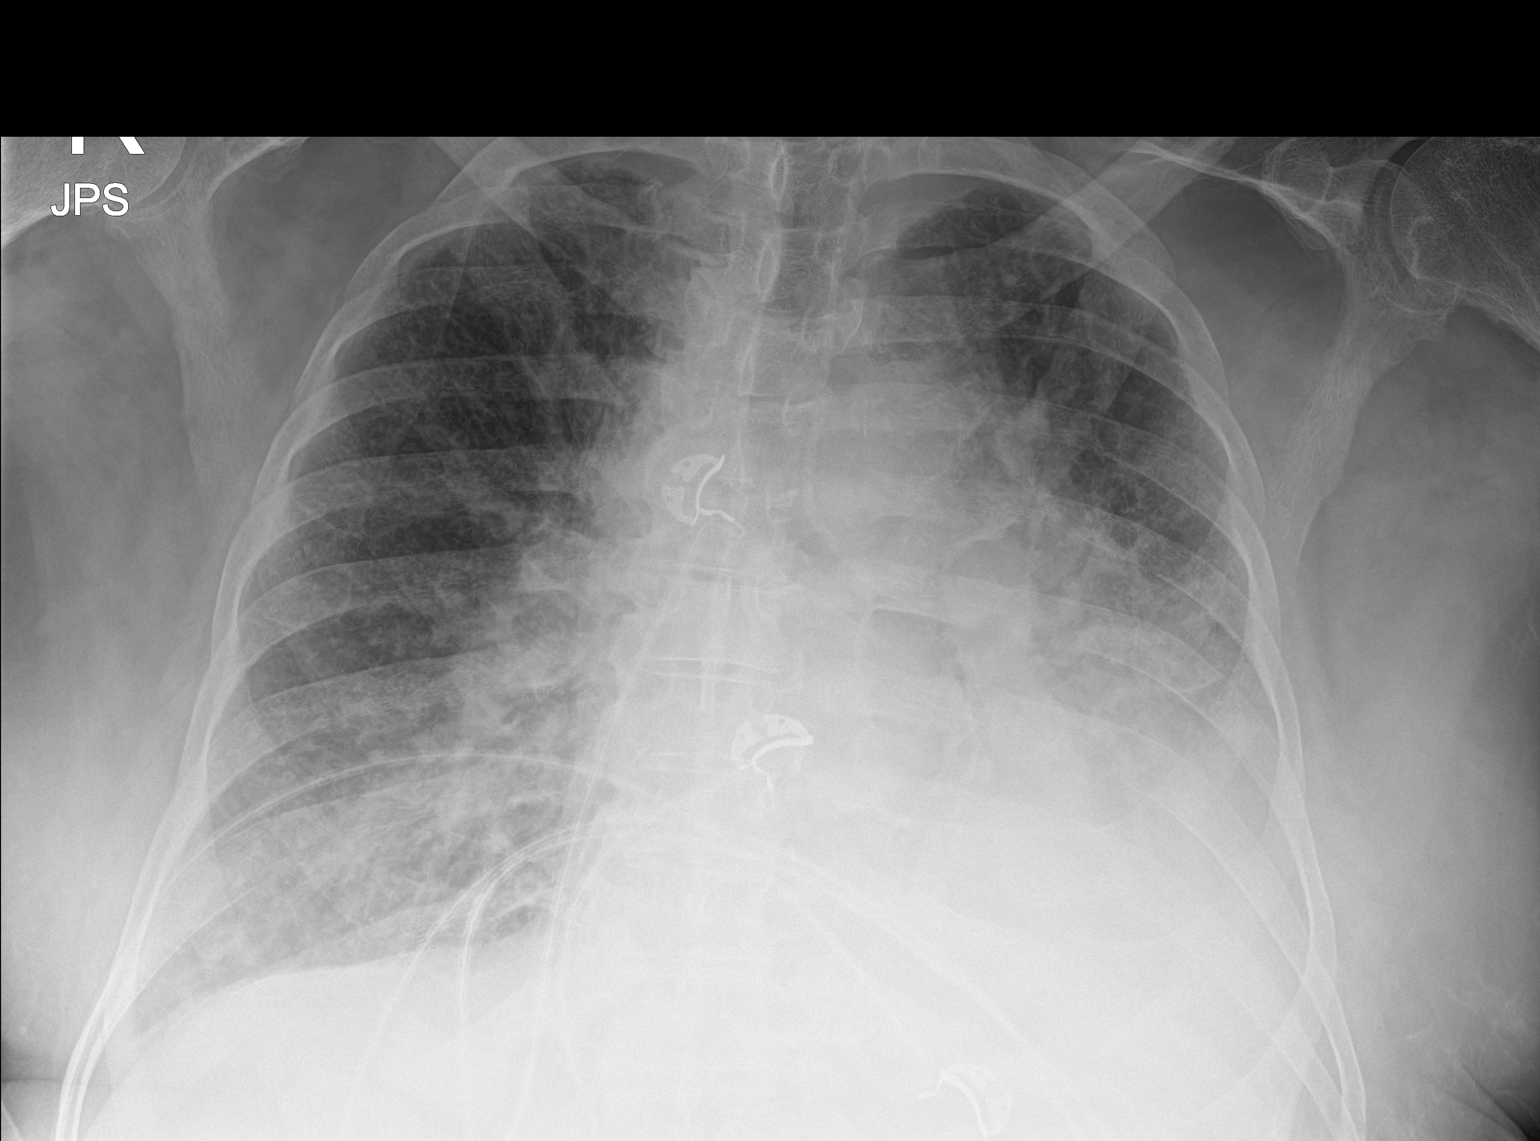

[1 of 1 positions shown; findings below may reference images not displayed]

FINDINGS: Stable cardiomegaly with central pulmonary vascular congestion. No
pneumothorax is noted. Stable left perihilar and basilar opacity is
noted concerning for atelectasis or infiltrate with associated
pleural effusion. Increased right basilar atelectasis or infiltrate
is noted. Bony thorax is unremarkable.
IMPRESSION: Stable cardiomegaly with central pulmonary vascular congestion.
Stable left basilar opacity and effusion as described above.
Increased right basilar atelectasis or infiltrate is noted.

## 2021-01-18 ENCOUNTER — Other Ambulatory Visit: Payer: Self-pay

## 2021-01-18 DIAGNOSIS — I4892 Unspecified atrial flutter: Secondary | ICD-10-CM | POA: Diagnosis not present

## 2021-01-18 DIAGNOSIS — E1122 Type 2 diabetes mellitus with diabetic chronic kidney disease: Secondary | ICD-10-CM | POA: Diagnosis not present

## 2021-01-18 DIAGNOSIS — I480 Paroxysmal atrial fibrillation: Secondary | ICD-10-CM | POA: Diagnosis not present

## 2021-01-18 DIAGNOSIS — I130 Hypertensive heart and chronic kidney disease with heart failure and stage 1 through stage 4 chronic kidney disease, or unspecified chronic kidney disease: Secondary | ICD-10-CM | POA: Diagnosis not present

## 2021-01-18 DIAGNOSIS — I50811 Acute right heart failure: Secondary | ICD-10-CM | POA: Diagnosis not present

## 2021-01-18 DIAGNOSIS — I5043 Acute on chronic combined systolic (congestive) and diastolic (congestive) heart failure: Secondary | ICD-10-CM | POA: Diagnosis not present

## 2021-01-18 DIAGNOSIS — D631 Anemia in chronic kidney disease: Secondary | ICD-10-CM | POA: Diagnosis not present

## 2021-01-18 DIAGNOSIS — N1832 Chronic kidney disease, stage 3b: Secondary | ICD-10-CM | POA: Diagnosis not present

## 2021-01-18 DIAGNOSIS — L89312 Pressure ulcer of right buttock, stage 2: Secondary | ICD-10-CM | POA: Diagnosis not present

## 2021-01-18 NOTE — Patient Outreach (Signed)
[facility]) Care Management  01/18/2021  Masiyah Engen [birth year] [id number]   Date of Review: 01/18/21 Reason:  Readmission  PCP: Dr. [name], [place] (Followed by [facility] Failure Team) Insurance: [employer]  Medical Info: Mr. Duffus is a 75 yo male with PMH chronic dCHF, CKD4, afib (on Eliquis), COPD (on chronic 3-4 L O2), OHS/OSA, HTN, HLD, pulmonary HTN, DMII.   Admissions:  Patient with 5 admissions in the last 6 months.   Patient admitted on 12/11/20 to 12/19/20 with abdominal distention/bloating and pain that had been going on for approximately 2 weeks.  He also had associated nausea/vomiting.  He was very lethargic on presentation. Patient diagnosed with sepsis from pneumonia, acute renal failure, acute heart failure and acute metabolic encephalopathy.  Patient refused SNF.  Discharged home with home health.    Patient admitted on 01/06/21 with shortness of breath, leg swelling, and weight gain.  Patient diagnosed with acute heart failure.  Patient underwent cardioversion with success and diuresed.  Patient again refuses SNF.  There was some mention of trying paramedicine again.  Patient discharged home with home health through [place]. [facility] refused due to non-compliance.  Patient has also refused hospital at home program.    Disposition: Patient home with home health and CM telephonic support.  Recommendation: Inquire about services that [employer] provides. Plan follow up in two weeks.    Telephone call to [facility] [phone]  Ext. [phone].  Voice mail left.  Plan: RN CM will wait return call to discuss case.    Jone Baseman, RN, MSN [facility] Management Care Management Coordinator Direct Line [phone] Cell [phone] Toll Free: [phone]  Fax: [phone]

## 2021-01-22 ENCOUNTER — Other Ambulatory Visit: Payer: Self-pay

## 2021-01-22 IMAGING — DX DG CHEST 1V PORT
1 series · 2 of 2 positions shown · non-contrast
Comparison: Chest radiograph dated 08/15/2019

CLINICAL DATA: 73-year-old male status post intubation

EXAM:
PORTABLE CHEST 1 VIEW

[Series 1: chest ap · 0.14mm/px · 2 of 2 slices shown]
[im 1/2]
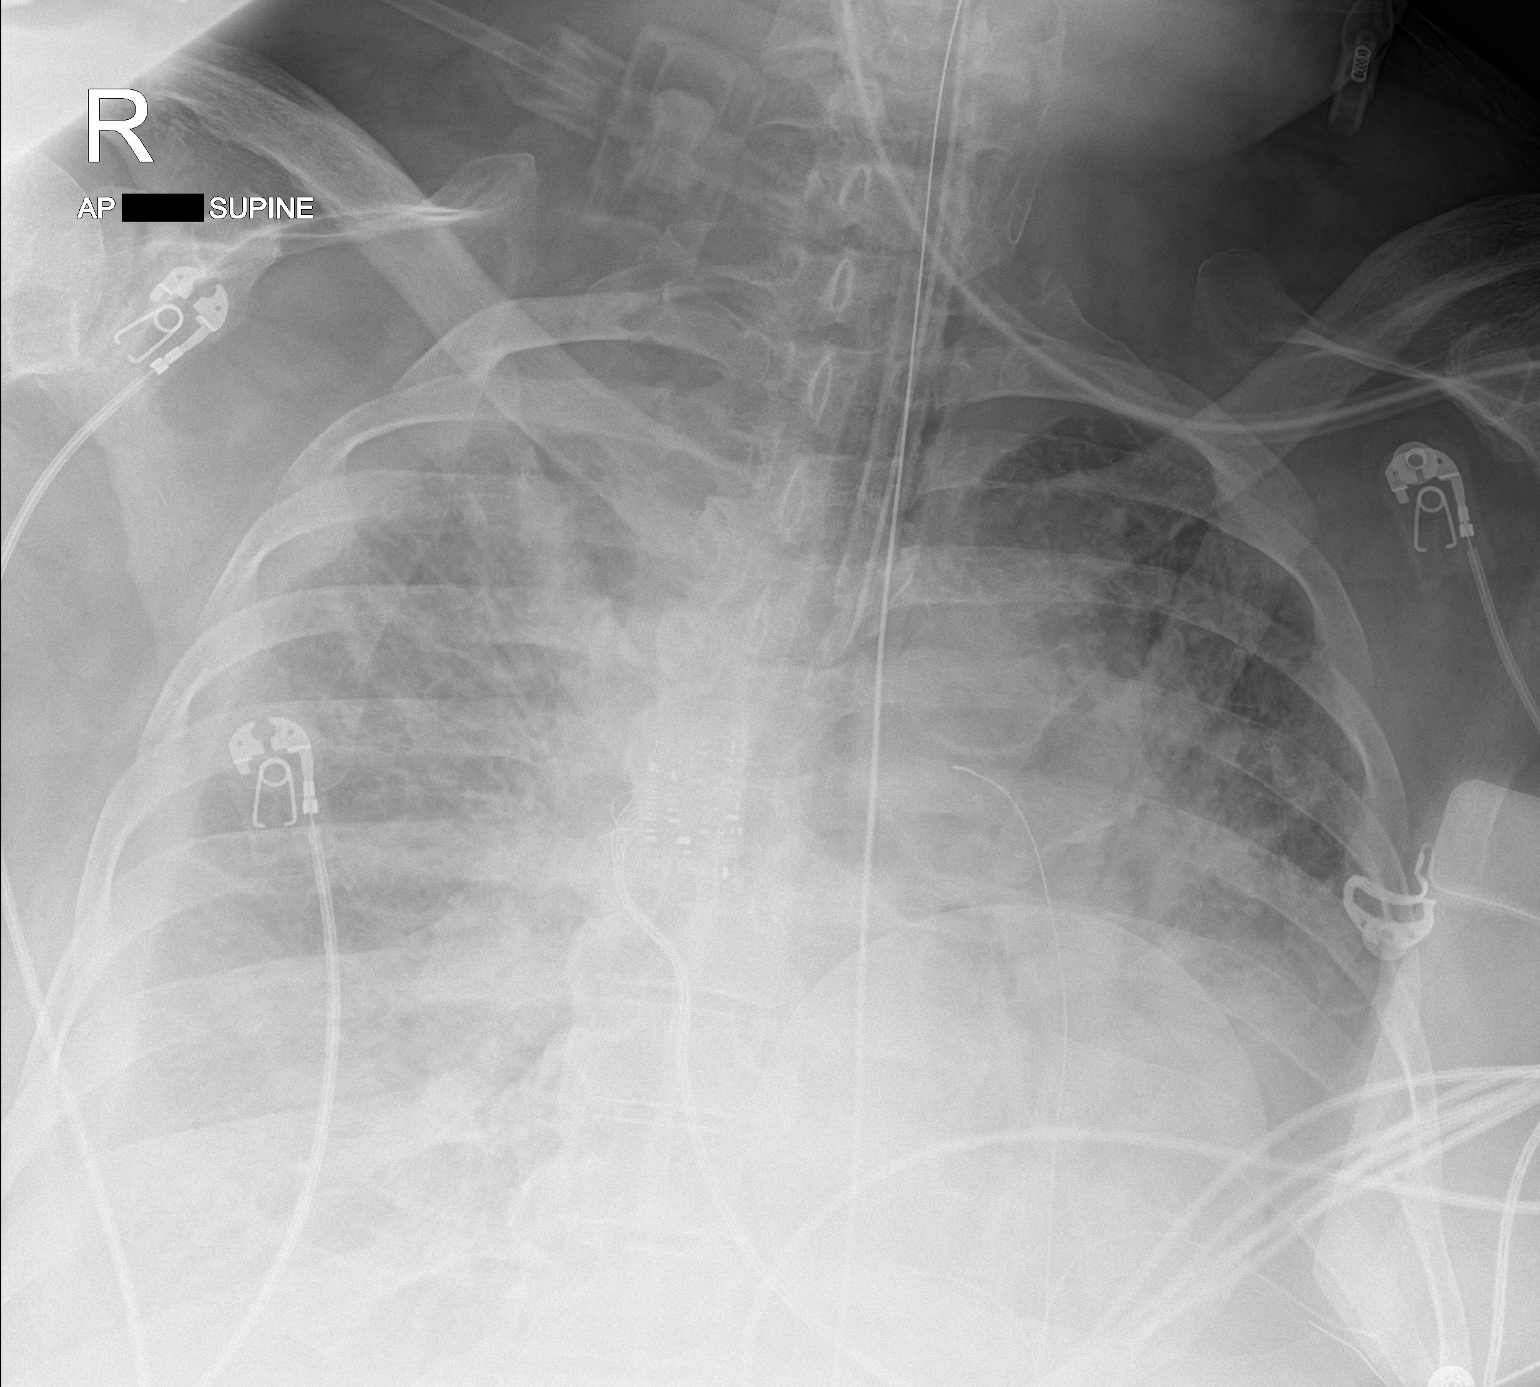
[im 2/2]
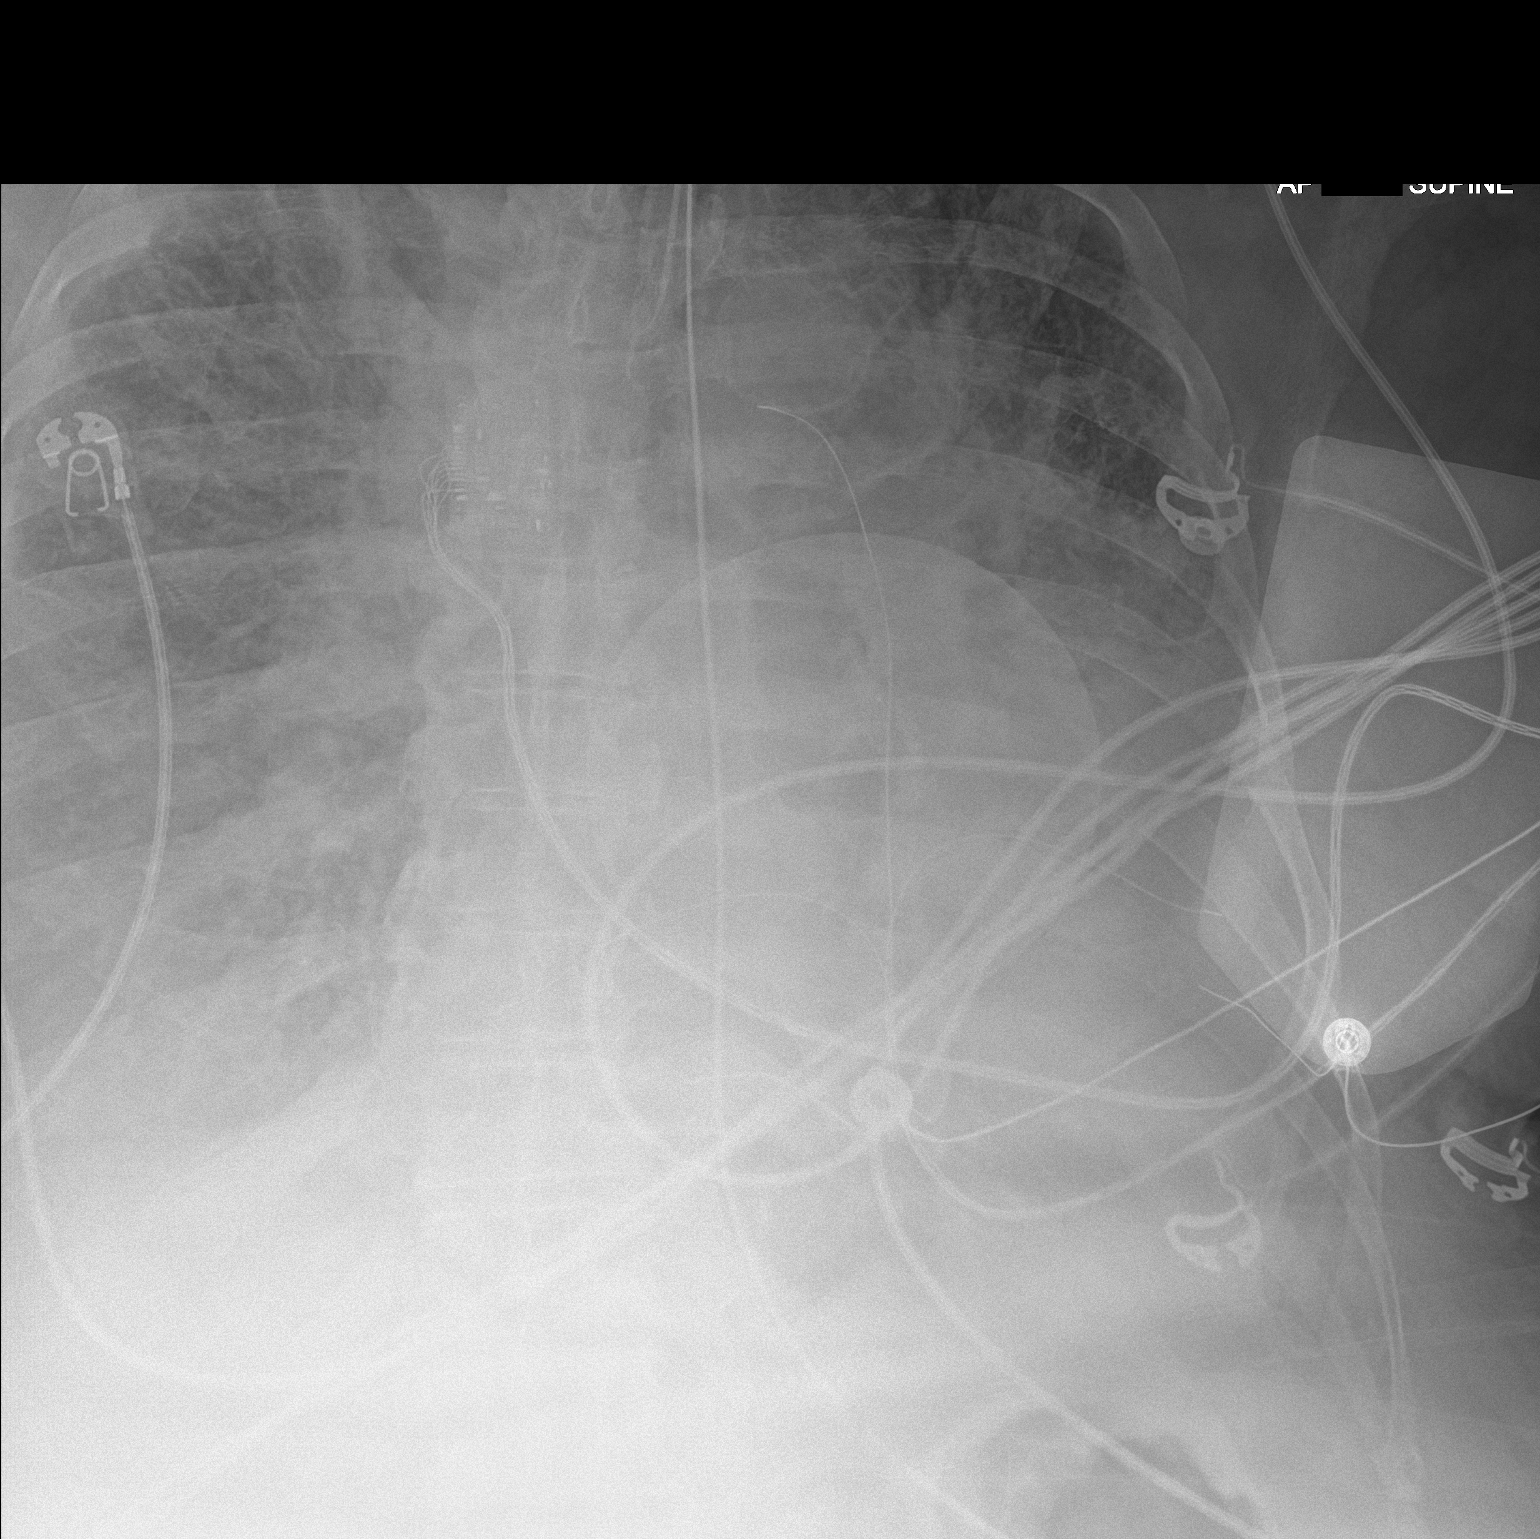

[2 of 2 positions shown; findings below may reference images not displayed]

FINDINGS: Endotracheal tube with tip approximately 4 cm above the carina. An
enteric tube extends below the diaphragm with tip beyond the
inferior margin of the image.

Interval worsening of bilateral interstitial and airspace density
compared to radiograph of 08/15/2019 and development of bilateral
pleural effusions, right greater left. Findings most consistent with
worsening pulmonary edema or CHF although pneumonia is not excluded.
Clinical correlation is recommended. There is no pneumothorax. The
cardiac borders are silhouetted. No acute osseous pathology.
IMPRESSION: 1. Endotracheal tube above the carina.
2. Significant interval worsening of the bilateral interstitial
prominence and airspace opacities and development of bilateral
pleural effusions, right greater left. Findings most consistent with
worsening pulmonary edema or pneumonia. Clinical correlation and
follow-up recommended.

## 2021-01-22 NOTE — Patient Outreach (Signed)
[facility]) Care Management  01/22/2021  Saunders [place] Apr 02, 1946 [id number]   Telephone call to [place] for [employer] [facility] [place].  No answer.  HIPAA compliant voice message left.    Plan: RN CM will wait return call.  Jone Baseman, RN, MSN [place] Management Care Management Coordinator Direct Line [phone] Cell [phone] Toll Free: [phone]  Fax: [phone]

## 2021-01-23 IMAGING — CT CT HEAD W/O CM
4 series · 16 of 47 positions shown, 18 images · non-contrast
Comparison: Prior CT from 04/09/2019.

CLINICAL DATA: Initial evaluation for acute encephalopathy.

EXAM:
CT HEAD WITHOUT CONTRAST
TECHNIQUE: Contiguous axial images were obtained from the base of the skull
through the vertex without intravenous contrast.

[Series 3: head without · axial · non-contrast · 0.52mm/px · z∈[+1246,+1396]mm · 7 of 40 slices shown, 9 images]
[im 5/40  brain]
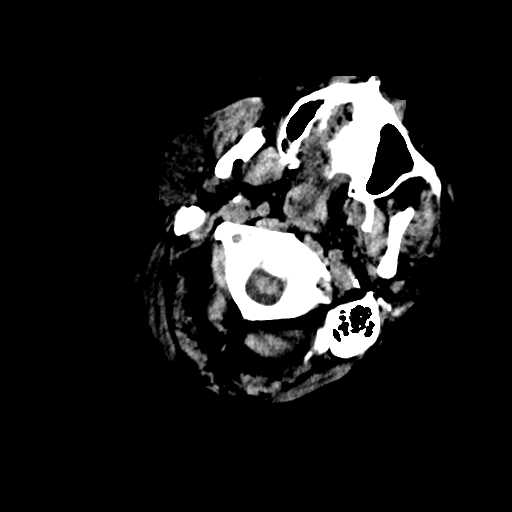
[im 5/40  bone]
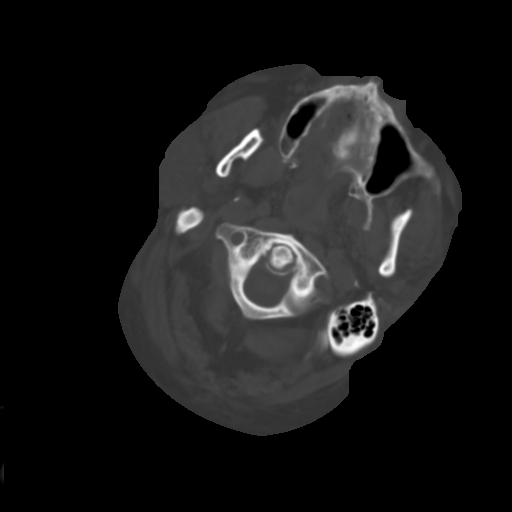
[im 10/40  brain]
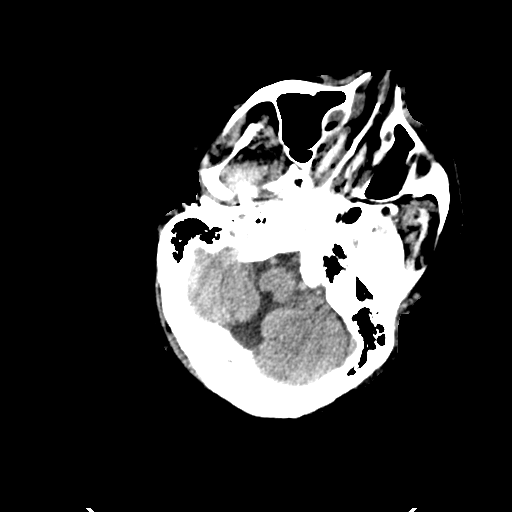
[im 15/40  brain]
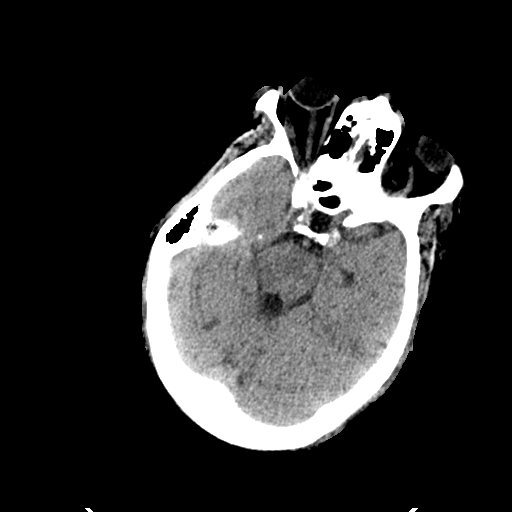
[im 20/40  brain]
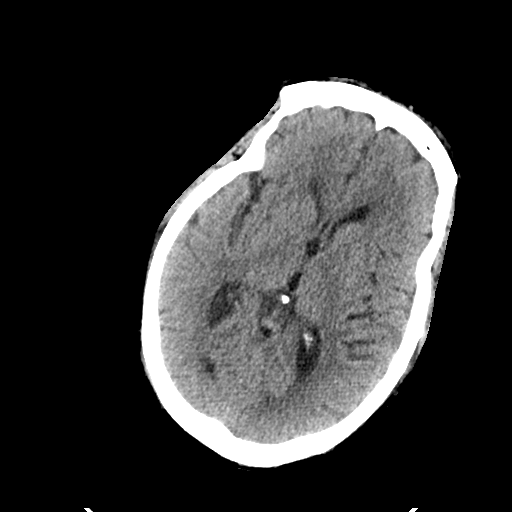
[im 25/40  brain]
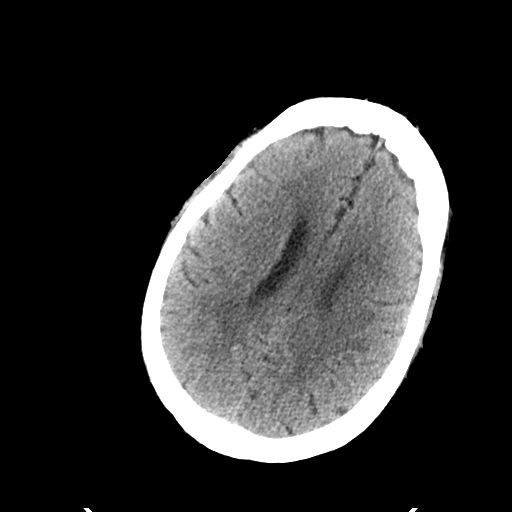
[im 25/40  bone]
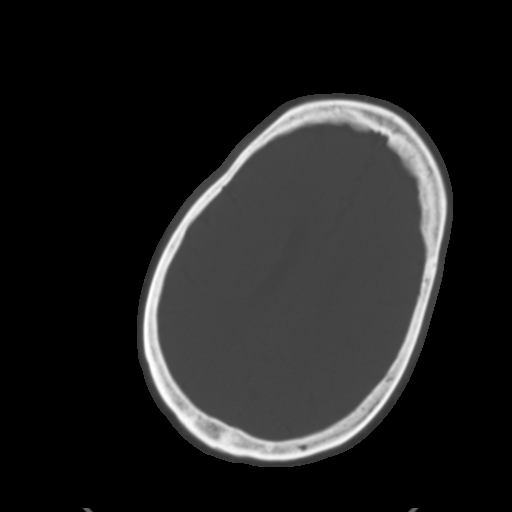
[im 30/40  brain]
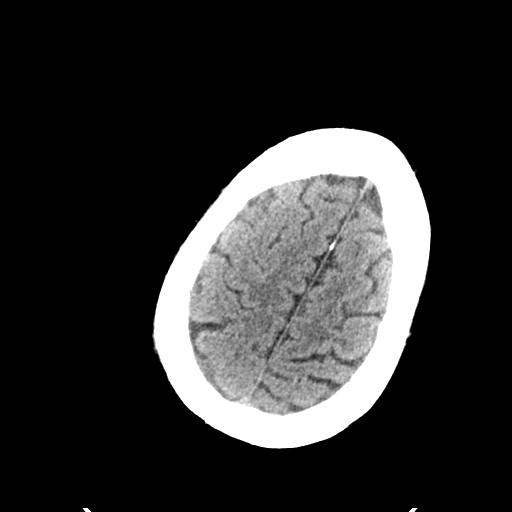
[im 35/40  brain]
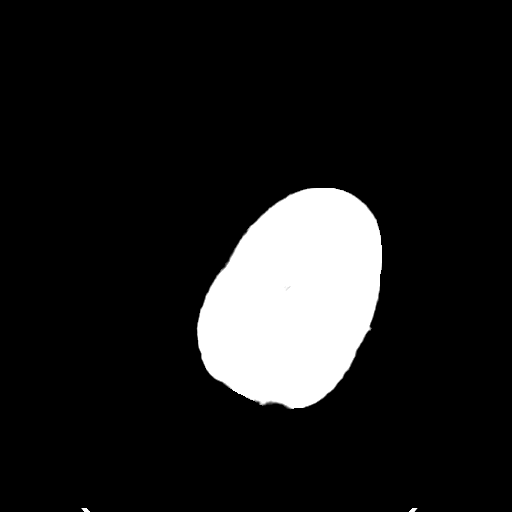

[Series 4: head bone · axial · 0.52mm/px · z∈[+1244,+1284]mm · 3 of 100 slices shown]
[im 10/100  bone]
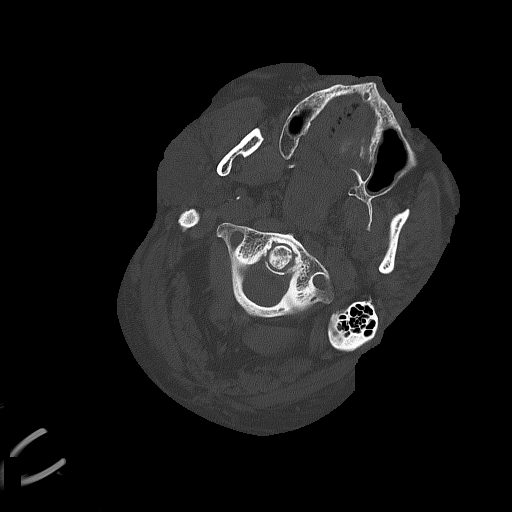
[im 20/100  bone]
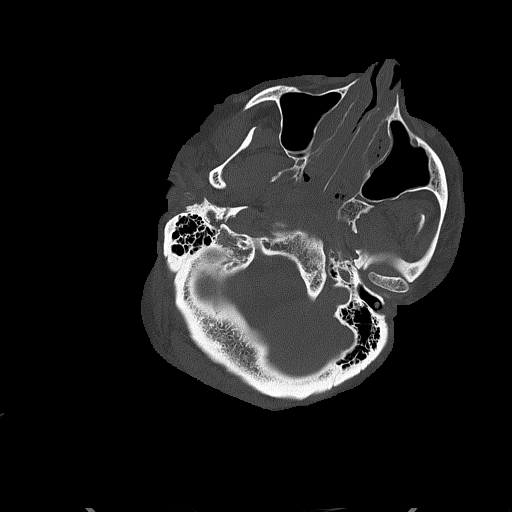
[im 30/100  bone]
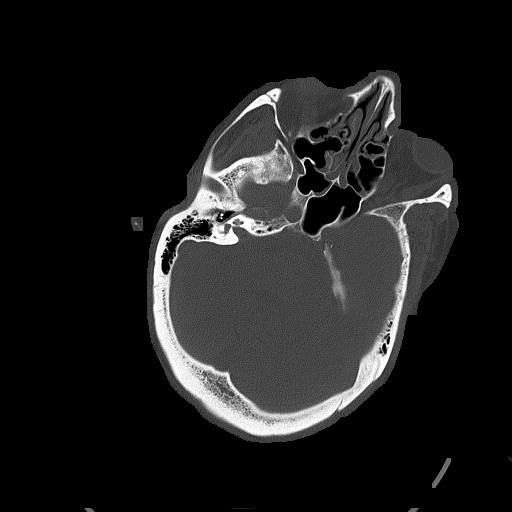

[Series 5: head without cor · coronal · non-contrast · 0.41mm/px · 3 of 78 slices shown]
[im 26/78  brain]
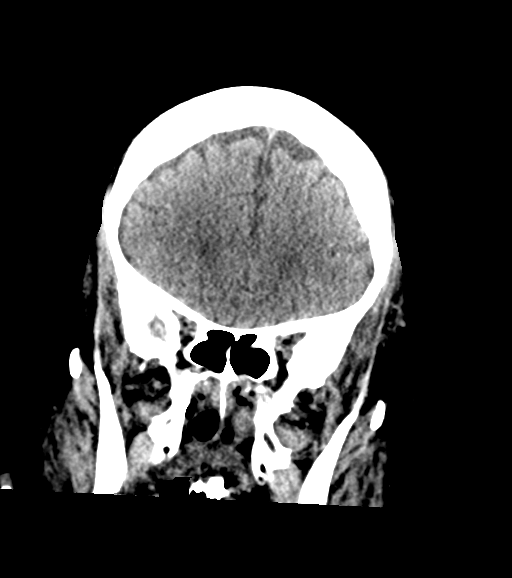
[im 35/78  brain]
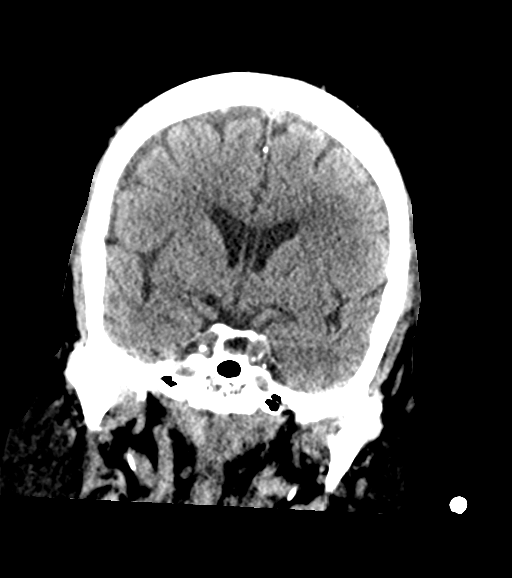
[im 43/78  brain]
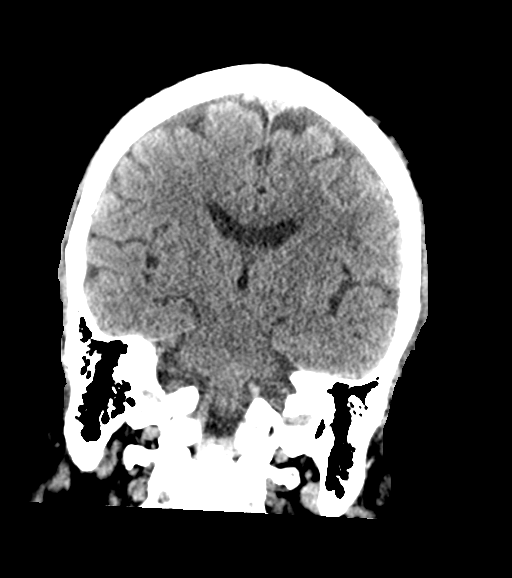

[Series 6: head without sag · sagittal · non-contrast · 0.44mm/px · 3 of 67 slices shown]
[im 23/67  brain]
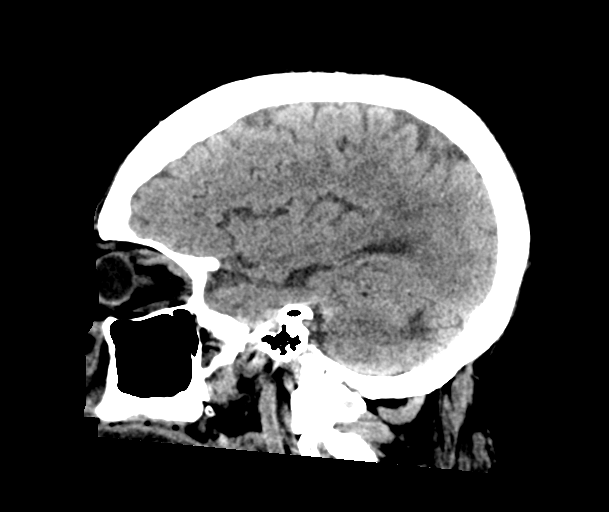
[im 34/67  brain]
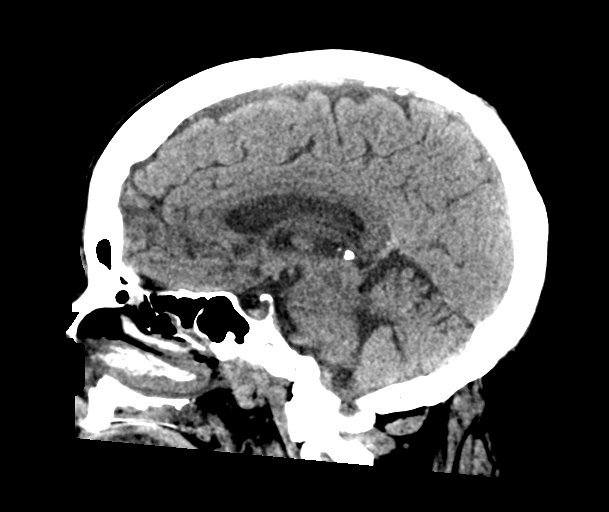
[im 45/67  brain]
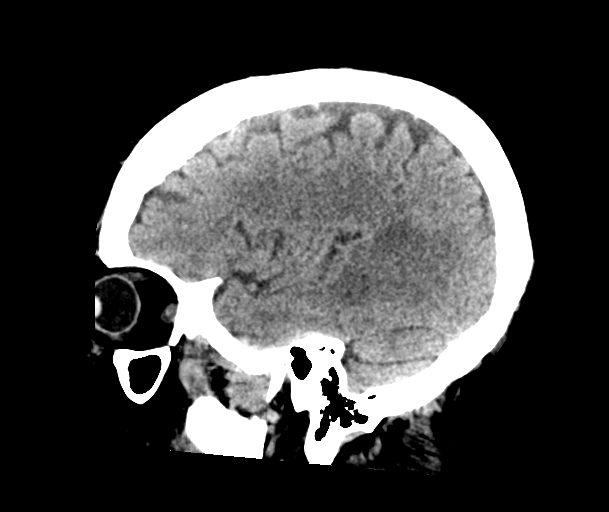

[16 of 47 positions shown; findings below may reference images not displayed]

FINDINGS: Brain: Cerebral volume within normal limits for age. Mild chronic
microvascular ischemic changes present within the periventricular
white matter, stable. Few scatter remote bilateral cerebellar
infarcts, also unchanged.

No acute intracranial hemorrhage. No acute large vessel territory
infarct. No mass lesion, midline shift or mass effect. No
hydrocephalus. No extra-axial fluid collection.

Vascular: No hyperdense vessel. Scattered vascular calcifications
noted within the carotid siphons.

Skull: Scalp soft tissues and calvarium within normal limits.

Sinuses/Orbits: Globes and orbital soft tissues within normal
limits. Patient intubated with endotracheal and enteric tubes
partially visualized. Scattered mucosal thickening noted within the
ethmoidal air cells. No mastoid effusion.

Other: None.
IMPRESSION: 1. No acute intracranial abnormality.
2. Stable mild chronic small vessel ischemic disease with few
scattered remote bilateral cerebellar infarcts.

## 2021-01-23 IMAGING — DX DG CHEST 1V PORT
1 series · 1 of 1 positions shown · non-contrast
Comparison: August 20, 2019

CLINICAL DATA: Central line placement

EXAM:
PORTABLE CHEST 1 VIEW

[chest]
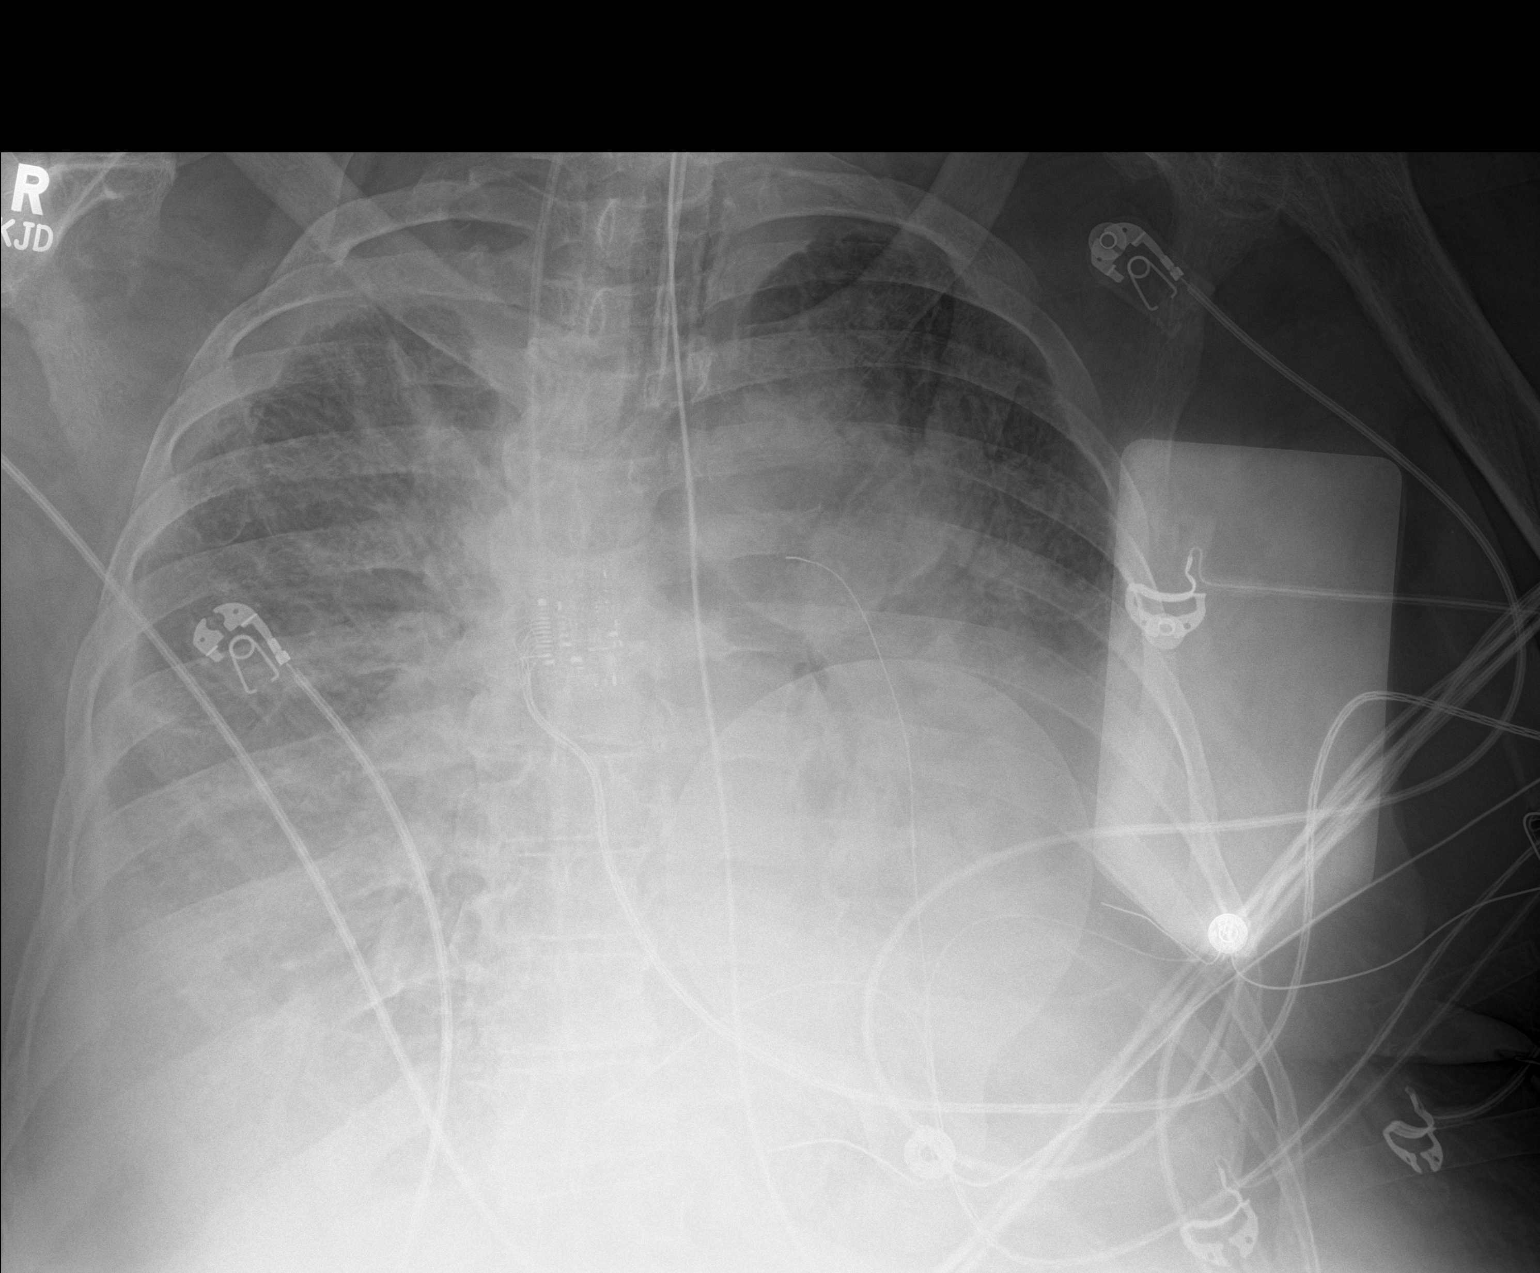

[1 of 1 positions shown; findings below may reference images not displayed]

FINDINGS: Endotracheal tube tip is 2.5 cm above the carina. NG tube is seen
coursing below the diaphragm. A right-sided central venous catheter
is seen with the tip left the distal SVC. There is hazy airspace
opacity with interstitial markings seen throughout both lungs.
Bilateral pleural effusions are seen, right greater than left. The
cardiomediastinal silhouette is partially obscured.
IMPRESSION: 1. Endotracheal tube tip 2.5 cm above the carina.
2. NG tube below the diaphragm
3. Right-sided central venous catheter with the tip in the distal
SVC.
4. Unchanged hazy/interstitial opacities throughout both lungs,
likely pulmonary edema and/or infectious etiology.
5. Bilateral pleural effusions, right greater than left.

## 2021-01-24 ENCOUNTER — Other Ambulatory Visit: Payer: Self-pay

## 2021-01-24 IMAGING — DX DG CHEST 1V PORT
1 series · 1 of 1 positions shown · non-contrast
Comparison: Yesterday

CLINICAL DATA: Respiratory failure

EXAM:
PORTABLE CHEST 1 VIEW

[chest ap]
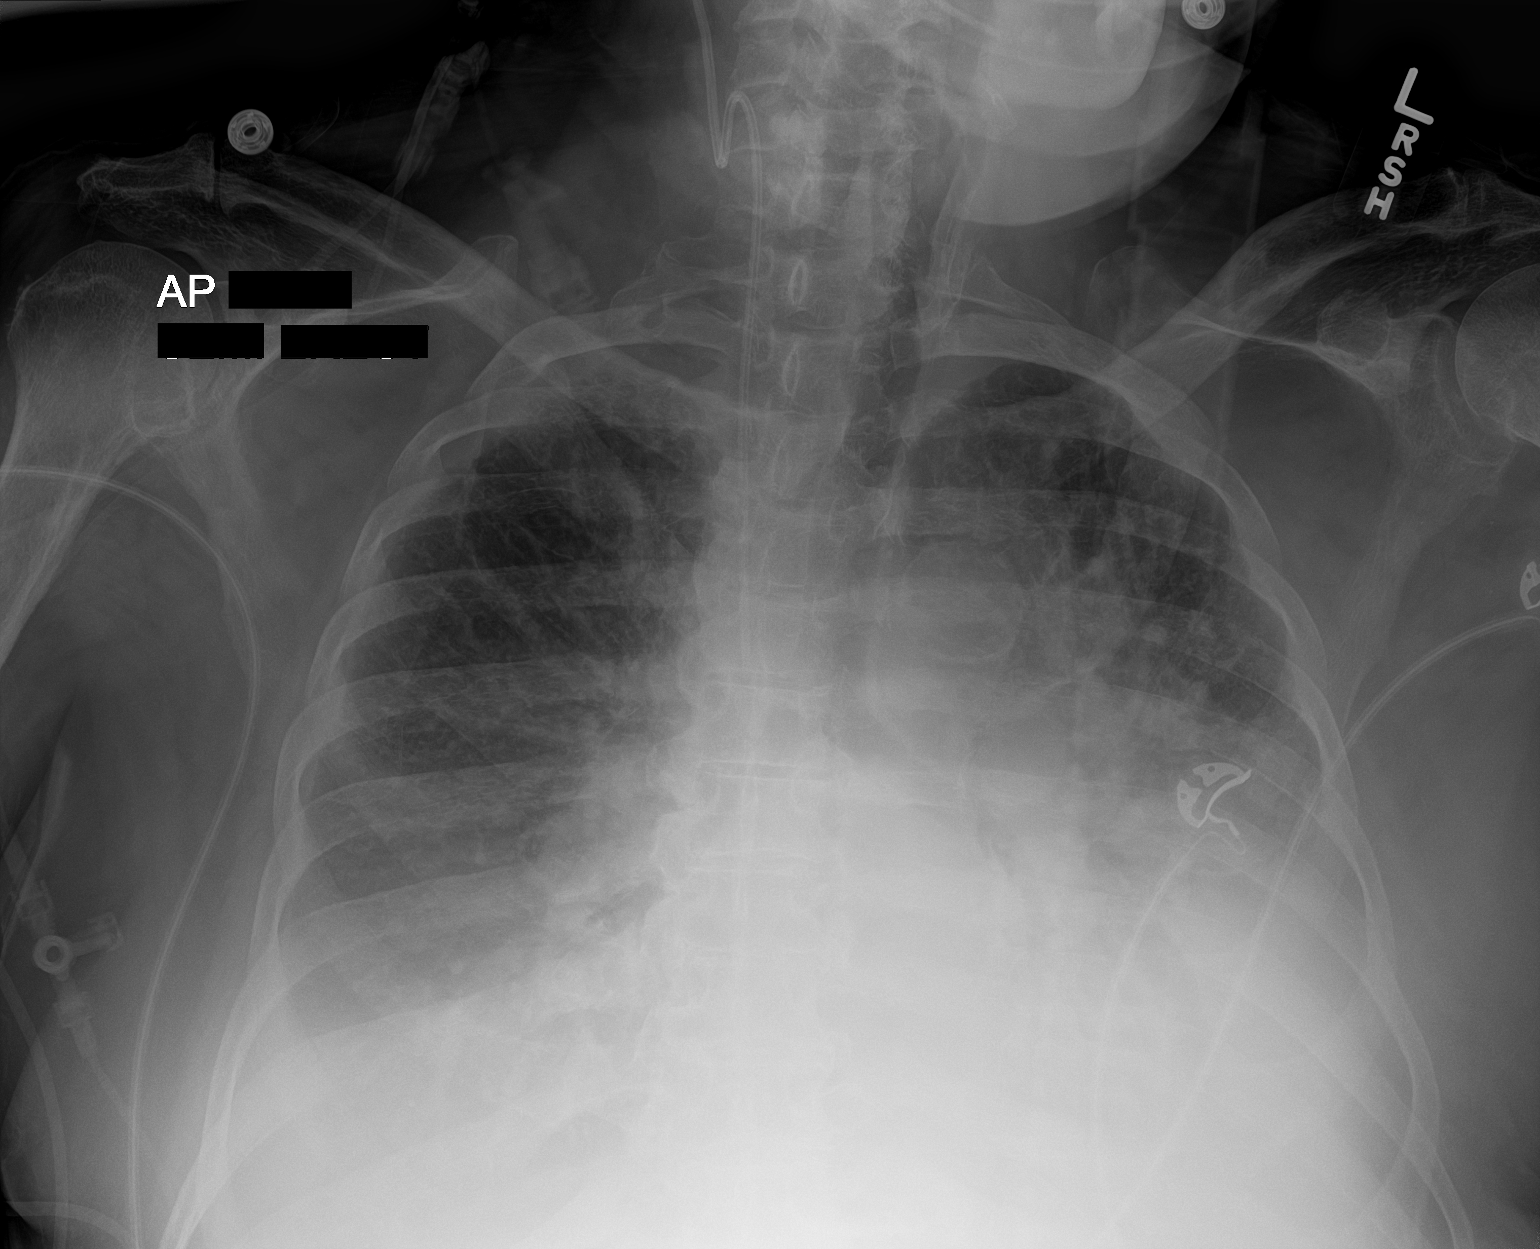

[1 of 1 positions shown; findings below may reference images not displayed]

FINDINGS: Right IJ line with tip at the SVC.

Hazy appearance of the bilateral chest from layering pleural
effusions and atelectasis by CT. Pulmonary vascular congestion that
has likely improved. Marked cardiomegaly. No pneumothorax.
IMPRESSION: Extubation with unchanged hazy appearance of the chest from layering
effusions and atelectasis based on most recent chest CT. Vascular
congestion which has likely improved.

## 2021-01-24 NOTE — Patient Outreach (Signed)
[facility]) Care Management  [place]  01/24/2021   Nicholas Caldwell 10-01-1946 [id number]  Subjective: Telephone call to wife for follow up.  She reports patient is doing good. She reports he has some slight swelling but states he will be assessed by home health nurse today or tomorrow.  She is not able to give me patient weight.  Discussed signs of heart failure and notification of heart failure clinic for changes. She verbalized understanding.    Discussed [facility] benefits for in home care and respite. She states they are aware of those services but choose not to have those presently.  She knows how to contact the [place] to access when needed.    Objective:   Encounter Medications:  Outpatient Encounter Medications as of 01/24/2021  Medication Sig Note  . acetaminophen (TYLENOL) 325 MG tablet Take 2 tablets (650 mg total) by mouth every 4 (four) hours as needed for headache or mild pain.   Marland Kitchen albuterol (VENTOLIN HFA) 108 (90 Base) MCG/ACT inhaler Inhale 2 puffs into the lungs every 6 (six) hours as needed for wheezing or shortness of breath. 12/11/2020: LF 11/15/2020 90 DS  . amiodarone (PACERONE) 200 MG tablet Take 1 tablet (200 mg total) by mouth daily.   Marland Kitchen apixaban (ELIQUIS) 5 MG TABS tablet Take 1 tablet (5 mg total) by mouth 2 (two) times daily.   Marland Kitchen atorvastatin (LIPITOR) 40 MG tablet Take 1 tablet (40 mg total) by mouth daily. (Patient taking differently: Take 40 mg by mouth at bedtime.)   . Budeson-Glycopyrrol-Formoterol (BREZTRI AEROSPHERE) 160-9-4.8 MCG/ACT AERO Inhale 2 puffs into the lungs 2 (two) times daily.   . carbamide peroxide (DEBROX) 6.5 % OTIC solution Place 5-10 drops into both ears 3 (three) times daily as needed (ear wax). 12/11/2020: LF 11/15/2020  . Carboxymethylcellulose Sod PF 0.25 % SOLN Place 1 drop into both eyes 2 (two) times daily as needed (dry eyes). 12/11/2020: LF 11/15/2020 30 DS  . Cholecalciferol (VITAMIN D) 50 MCG (2000 UT) CAPS Take 2,000  Units by mouth daily. 12/11/2020: LF 10/10/2020 90 DS  . famotidine (PEPCID) 20 MG tablet TAKE 1 TABLET(20 MG) BY MOUTH TWICE DAILY (Patient taking differently: Take 20 mg by mouth 2 (two) times daily as needed for indigestion.)   . feeding supplement, GLUCERNA SHAKE, (GLUCERNA SHAKE) LIQD Take 237 mLs by mouth 3 (three) times daily between meals.   . ferrous sulfate 325 (65 FE) MG tablet Take 325 mg by mouth every Monday, Wednesday, and Friday at 6 PM.   . fluticasone (FLONASE) 50 MCG/ACT nasal spray Place 2 sprays into both nostrils daily as needed for allergies.   . hydrocortisone (ANUSOL-HC) 2.5 % rectal cream Place 1 application rectally 2 (two) times daily as needed for hemorrhoids or itching.  12/11/2020: LF 11/15/2020 30 DS  . insulin aspart protamine- aspart (NOVOLOG MIX 70/30) (70-30) 100 UNIT/ML injection Inject 10 Units into the skin 2 (two) times daily with a meal.   . Insulin Syringe 27G X 1/2" 0.5 ML MISC 100 Syringes by Does not apply route 3 (three) times daily.   Marland Kitchen ipratropium-albuterol (DUONEB) 0.5-2.5 (3) MG/3ML SOLN Take 3 mLs by nebulization 4 (four) times daily as needed. 12/11/2020: LF 11/15/2020  . latanoprost (XALATAN) 0.005 % ophthalmic solution Place 1 drop into both eyes at bedtime. 12/11/2020: LF 11/15/2020 90 DS  . lidocaine (LIDODERM) 5 % Place 1 patch onto the skin daily. Remove & Discard patch within 12 hours or as directed by MD   .  Nystatin (GERHARDT'S BUTT CREAM) CREA Apply 1 application topically 2 (two) times daily.   Marland Kitchen oxybutynin (DITROPAN) 5 MG tablet Take 1 tablet by mouth 4 (four) times daily as needed for incontinence. 12/11/2020: LF 11/15/2020 30 DS  . oxyCODONE-acetaminophen (PERCOCET) 10-325 MG tablet Take 1 tablet by mouth every 6 (six) hours as needed for pain. 12/11/2020: PMP: Narcotic score 511; Sedative score 461; Overdose risk 370; LF [facility] [place] on 11/12/2020 #150/30 DS  . OXYGEN Inhale 3 L/min into the lungs continuous.   . pantoprazole (PROTONIX) 40 MG  tablet Take 40 mg by mouth 2 (two) times daily. 12/11/2020: LF 11/15/2020 90 DS  . polyethylene glycol powder (GLYCOLAX/MIRALAX) 17 GM/SCOOP powder Take 17 g by mouth daily as needed for constipation.   . potassium chloride SA (KLOR-CON) 20 MEQ tablet Take 1 tablet (20 mEq total) by mouth 2 (two) times daily.   Marland Kitchen senna-docusate (SENOKOT-S) 8.6-50 MG tablet Take 1 tablet by mouth at bedtime as needed for mild constipation.   . tamsulosin (FLOMAX) 0.4 MG CAPS capsule Take 1 capsule (0.4 mg total) by mouth daily. (Patient taking differently: Take 0.4 mg by mouth daily after breakfast.)   . terbinafine (LAMISIL) 1 % cream Apply 1 application topically daily. 12/11/2020: Groin for 4 weeks  . torsemide 40 MG TABS Take 80 mg by mouth 2 (two) times daily.   Marland Kitchen umeclidinium bromide (INCRUSE ELLIPTA) 62.5 MCG/INH AEPB Inhale 1 puff into the lungs daily. (Patient not taking: Reported on 01/11/2021)    No facility-administered encounter medications on file as of 01/24/2021.    Functional Status:  In your present state of health, do you have any difficulty performing the following activities: 01/11/2021 01/06/2021  Hearing? N -  Vision? N -  Difficulty concentrating or making decisions? N -  Walking or climbing stairs? Y -  Comment weakness -  Dressing or bathing? Y -  Comment wife assists -  Doing errands, shopping? Y N  Comment wife assists -  Conservation officer, nature and eating ? N -  Using the Toilet? N -  In the past six months, have you accidently leaked urine? N -  Do you have problems with loss of bowel control? N -  Managing your Medications? Y -  Comment wife assists -  Managing your Finances? Y -  Comment wife assists -  Housekeeping or managing your Housekeeping? Y -  Comment wife assists -  Some recent data might be hidden    Fall/Depression Screening: Fall Risk  01/11/2021 12/26/2016  Falls in the past year? 0 Yes  Number falls in past yr: - 2 or more  Injury with Fall? - Yes  Risk Factor  Category  - High Fall Risk  Risk for fall due to : - History of fall(s);Impaired balance/gait;Impaired mobility;Impaired vision  Follow up - Follow up appointment   [facility] 2/9 Scores 01/11/2021 12/26/2016  PHQ - 2 Score - 0  Exception Documentation Other- indicate reason in comment box -  Not completed spoke -    Assessment:  Goals Addressed            This Visit's Progress   . Make and Keep All Appointments   On track    Timeframe:  Long-Range Goal Priority:  High Start Date:   01/11/21                          Expected End Date:    07/03/21  Follow Up Date 03/02/21   - arrange a ride through an agency 1 week before appointment - ask family or friend for a ride    Why is this important?    Part of staying healthy is seeing the doctor for follow-up care.   If you forget your appointments, there are some things you can do to stay on track.    Notes: 01/11/21 Discussed importance of follow up appointments. 01/16/21 patient has up coming appointments.   01/24/21 patient has upcoming heart appointment on 01-31-21    . Track and Manage Fluids and Swelling-Heart Failure   On track    Timeframe:  Long-Range Goal Priority:  High Start Date:       01/11/21                      Expected End Date:   07/03/21                    Follow Up Date 03/02/21   - call office if I gain more than 2 pounds in one day or 5 pounds in one week - use salt in moderation - watch for swelling in feet, ankles and legs every day    Why is this important?    It is important to check your weight daily and watch how much salt and liquids you have.   It will help you to manage your heart failure.    Notes: 01/11/21 Discussed importance of daily weights and notifying physician for increases.  01/16/21 patient weight 223 lbs. Patient adhering to guidance.   01/24/21 Discussed notifying heart failure clinic of weight changes.     . Track and Manage Symptoms-Heart Failure   On track     Timeframe:  Long-Range Goal Priority:  High Start Date:   01/11/21                          Expected End Date:    07/03/21                   Follow Up Date 03/02/21   - track symptoms and what helps feel better or worse    Why is this important?    You will be able to handle your symptoms better if you keep track of them.   Making some simple changes to your lifestyle will help.   Eating healthy is one thing you can do to take good care of yourself.    Notes: 01/11/21 Discussed importance of heart failure regimen. 01/16/21 reiterated heart failure management. Patient weight 223 lbs.  01/24/21 Wife unable to give last weight on call.         Plan: RN CM will follow up next month.  Follow-up:  Patient agrees to Care Plan and Follow-up.   Jone Baseman, RN, MSN [place] Management Care Management Coordinator Direct Line [phone] Cell [phone] Toll Free: [phone]  Fax: [phone]

## 2021-01-24 NOTE — Patient Outreach (Signed)
[facility]) Care Management  01/24/2021  Vada Yellen Mar 12, 1946 [id number]   Incoming call from Anna.  She states that patient does not have any VA Community services at this time but patient is eligible for in home aide if he has more than 3 ADL needs.  Patient also qualifies for In Home Respite.  She advised that if patient and family want those services to have wife call or I could call and leave her a message. She adds that they approved for skilled home health through [place].  CM advised that Stanton Kidney is the primary nurse from [place]. Marquita will send VA brochures to CM via email.  CM appreciated her input.    Plan: RN CM will follow up with family.   Jone Baseman, RN, MSN [place] Management Care Management Coordinator Direct Line [phone] Cell [phone] Toll Free: [phone]  Fax: [phone]

## 2021-01-25 DIAGNOSIS — J449 Chronic obstructive pulmonary disease, unspecified: Secondary | ICD-10-CM | POA: Diagnosis not present

## 2021-01-25 DIAGNOSIS — R269 Unspecified abnormalities of gait and mobility: Secondary | ICD-10-CM | POA: Diagnosis not present

## 2021-01-25 DIAGNOSIS — J441 Chronic obstructive pulmonary disease with (acute) exacerbation: Secondary | ICD-10-CM | POA: Diagnosis not present

## 2021-01-25 DIAGNOSIS — J9621 Acute and chronic respiratory failure with hypoxia: Secondary | ICD-10-CM | POA: Diagnosis not present

## 2021-01-25 IMAGING — CT CT HEAD W/O CM
5 of 6 series · 17 of 47 positions shown, 18 images · non-contrast
Comparison: CT brain 08/21/2019

CLINICAL DATA: Altered LOC

EXAM:
CT HEAD WITHOUT CONTRAST
TECHNIQUE: Contiguous axial images were obtained from the base of the skull
through the vertex without intravenous contrast.

[Series 3: head without · axial · non-contrast · 0.46mm/px · z∈[-81,-21]mm · 2 of 37 slices shown]
[im 13/37  brain]
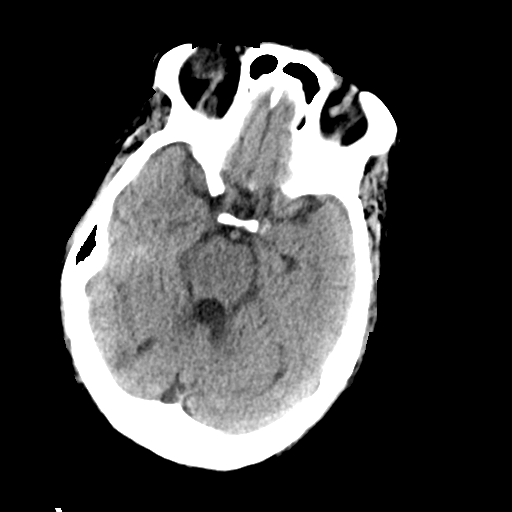
[im 25/37  brain]
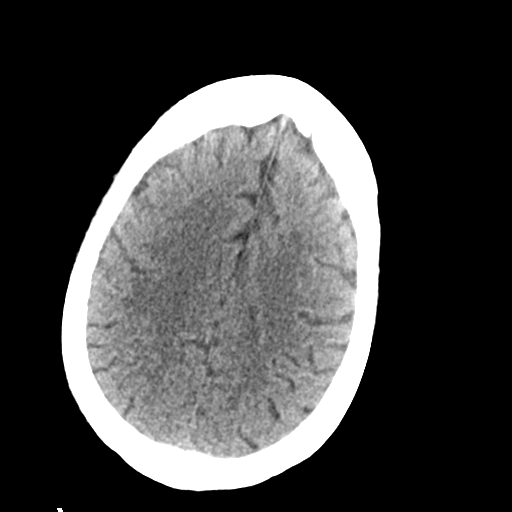

[Series 4: head bone · axial · 0.46mm/px · z∈[-123,+7]mm · 7 of 94 slices shown]
[im 10/94  bone]
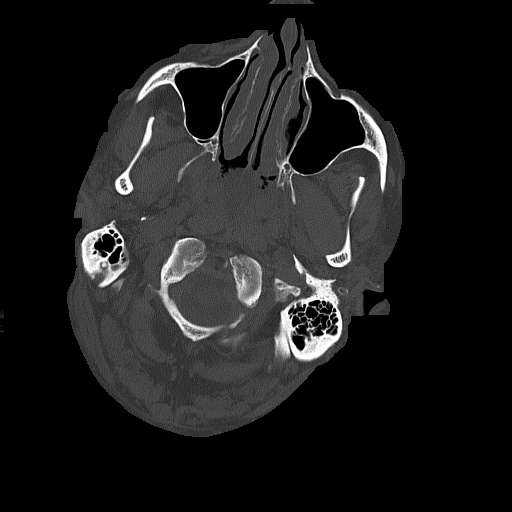
[im 19/94  bone]
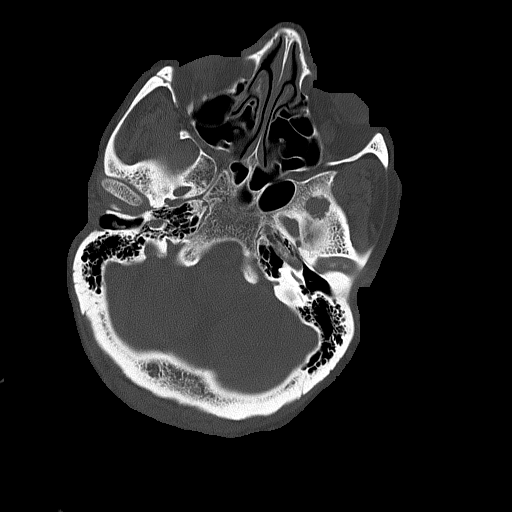
[im 28/94  bone]
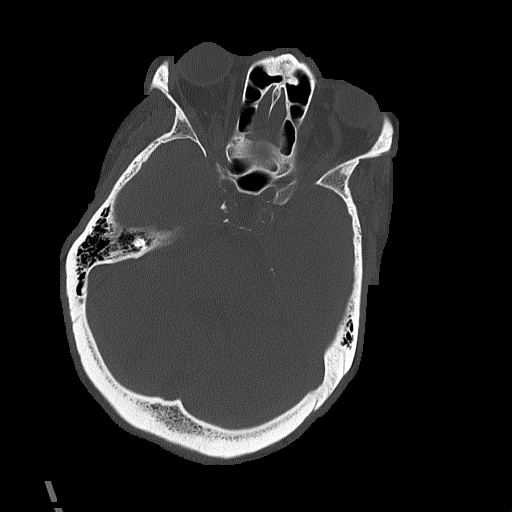
[im 38/94  bone]
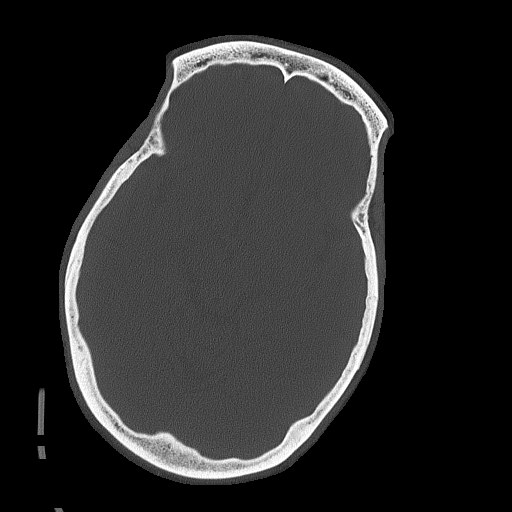
[im 56/94  bone]
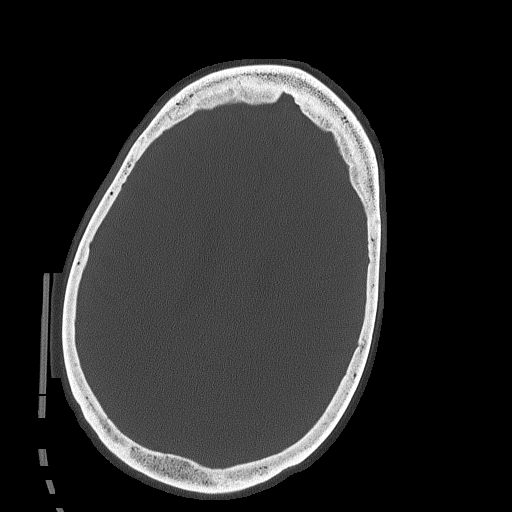
[im 66/94  bone]
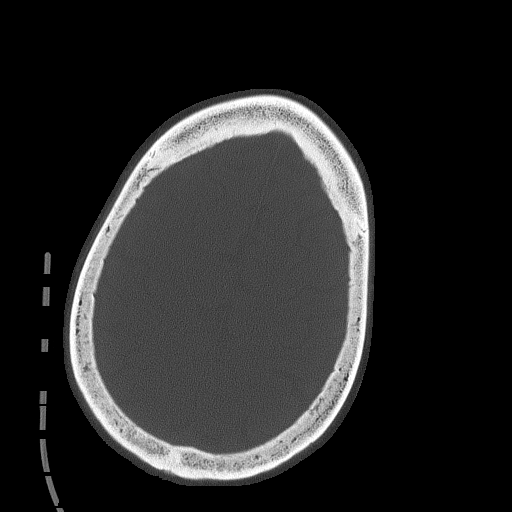
[im 75/94  bone]
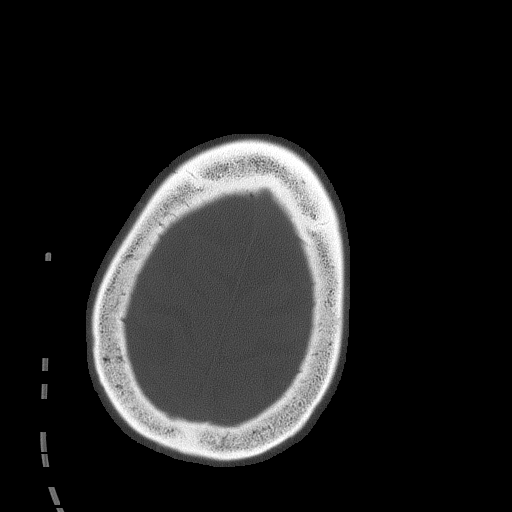

[Series 5: head without ax symmetry · axial · non-contrast · 0.35mm/px · z∈[-101,-41]mm · 2 of 38 slices shown, 3 images]
[im 13/38  brain]
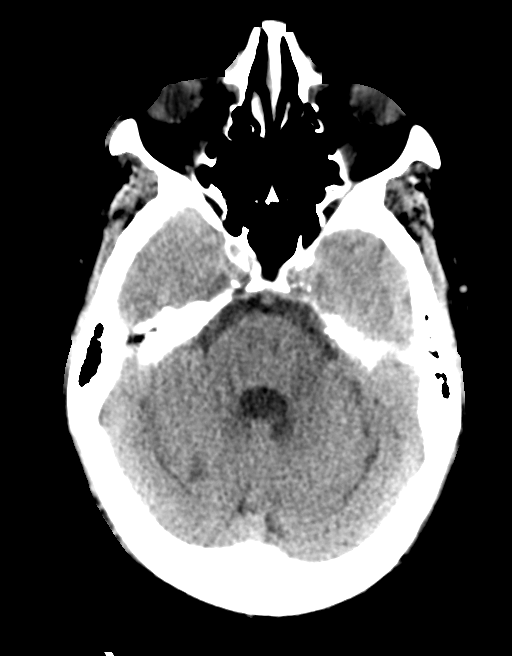
[im 13/38  bone]
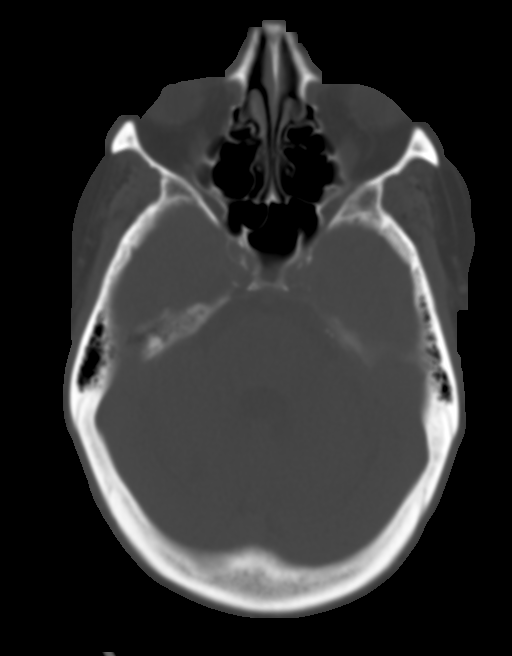
[im 25/38  brain]
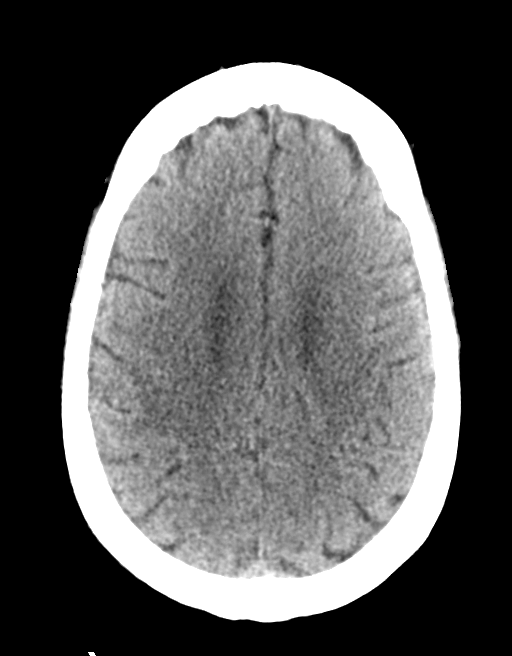

[Series 7: head without cor · coronal · non-contrast · 0.37mm/px · 3 of 75 slices shown]
[im 25/75  brain]
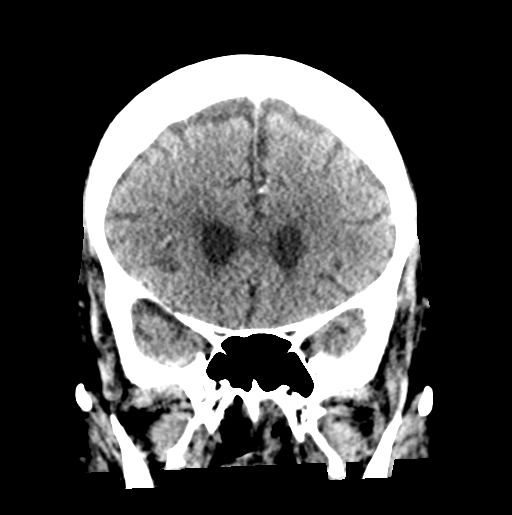
[im 33/75  brain]
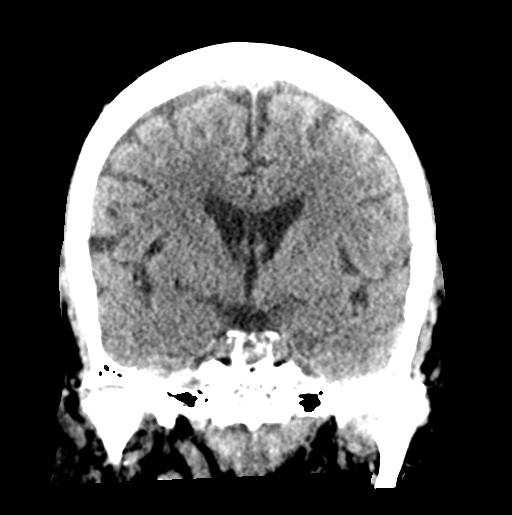
[im 42/75  brain]
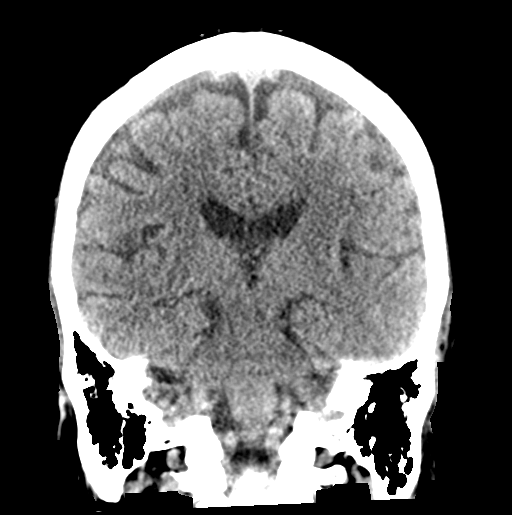

[Series 8: head without sag · sagittal · non-contrast · 0.40mm/px · 3 of 60 slices shown]
[im 20/60  brain]
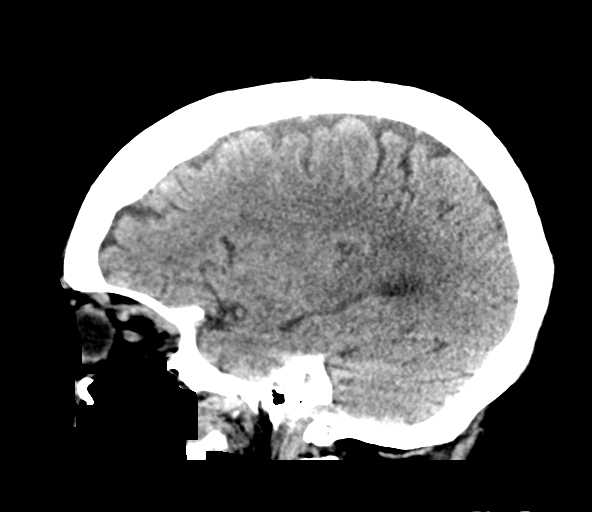
[im 30/60  brain]
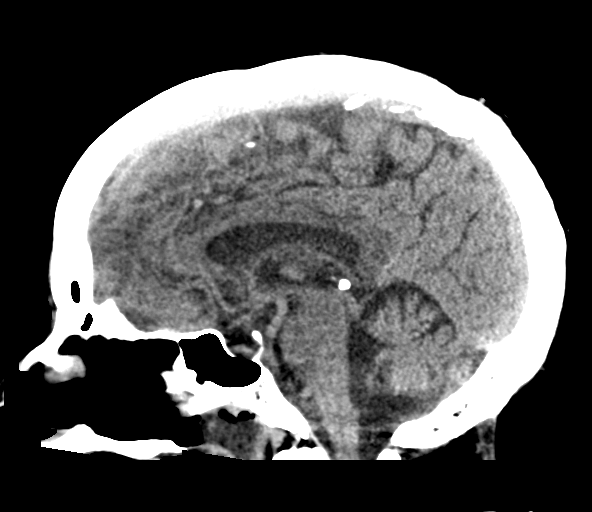
[im 40/60  brain]
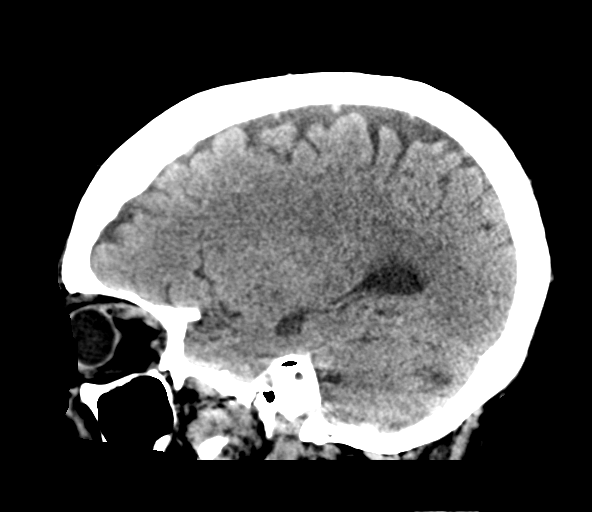

[17 of 47 positions shown; findings below may reference images not displayed]

FINDINGS: Brain: No acute territorial infarction, hemorrhage or intracranial
mass. Chronic infarcts in the right-greater-than-left cerebellum.
Mild small vessel ischemic changes of the white matter. Stable
ventricle size.

Vascular: No hyperdense vessels.  Carotid vascular calcification

Skull: Normal. Negative for fracture or focal lesion.

Sinuses/Orbits: No acute finding.

Other: None
IMPRESSION: 1. No CT evidence for acute intracranial abnormality.
2. Mild small vessel ischemic changes of the white matter. Chronic
cerebellar infarcts

## 2021-01-26 ENCOUNTER — Inpatient Hospital Stay (HOSPITAL_COMMUNITY)
Admission: EM | Admit: 2021-01-26 | Discharge: 2021-02-03 | DRG: 291 | Disposition: A | Discharge status: Home Health | Payer: No Typology Code available for payment source | Attending: Internal Medicine | Admitting: Internal Medicine

## 2021-01-26 ENCOUNTER — Other Ambulatory Visit: Payer: Self-pay

## 2021-01-26 ENCOUNTER — Encounter (HOSPITAL_COMMUNITY): Payer: Self-pay

## 2021-01-26 ENCOUNTER — Emergency Department (HOSPITAL_COMMUNITY): Payer: No Typology Code available for payment source

## 2021-01-26 DIAGNOSIS — I5043 Acute on chronic combined systolic (congestive) and diastolic (congestive) heart failure: Secondary | ICD-10-CM | POA: Diagnosis present

## 2021-01-26 DIAGNOSIS — G4733 Obstructive sleep apnea (adult) (pediatric): Secondary | ICD-10-CM | POA: Diagnosis present

## 2021-01-26 DIAGNOSIS — D631 Anemia in chronic kidney disease: Secondary | ICD-10-CM | POA: Diagnosis present

## 2021-01-26 DIAGNOSIS — E876 Hypokalemia: Secondary | ICD-10-CM | POA: Diagnosis present

## 2021-01-26 DIAGNOSIS — N1832 Chronic kidney disease, stage 3b: Secondary | ICD-10-CM | POA: Diagnosis present

## 2021-01-26 DIAGNOSIS — N400 Enlarged prostate without lower urinary tract symptoms: Secondary | ICD-10-CM | POA: Diagnosis present

## 2021-01-26 DIAGNOSIS — R0602 Shortness of breath: Secondary | ICD-10-CM | POA: Diagnosis not present

## 2021-01-26 DIAGNOSIS — E872 Acidosis, unspecified: Secondary | ICD-10-CM

## 2021-01-26 DIAGNOSIS — L89312 Pressure ulcer of right buttock, stage 2: Secondary | ICD-10-CM | POA: Diagnosis present

## 2021-01-26 DIAGNOSIS — Z7401 Bed confinement status: Secondary | ICD-10-CM

## 2021-01-26 DIAGNOSIS — Z7901 Long term (current) use of anticoagulants: Secondary | ICD-10-CM

## 2021-01-26 DIAGNOSIS — Z888 Allergy status to other drugs, medicaments and biological substances status: Secondary | ICD-10-CM

## 2021-01-26 DIAGNOSIS — L89329 Pressure ulcer of left buttock, unspecified stage: Secondary | ICD-10-CM | POA: Diagnosis present

## 2021-01-26 DIAGNOSIS — I130 Hypertensive heart and chronic kidney disease with heart failure and stage 1 through stage 4 chronic kidney disease, or unspecified chronic kidney disease: Secondary | ICD-10-CM | POA: Diagnosis not present

## 2021-01-26 DIAGNOSIS — E785 Hyperlipidemia, unspecified: Secondary | ICD-10-CM | POA: Diagnosis present

## 2021-01-26 DIAGNOSIS — Z20822 Contact with and (suspected) exposure to covid-19: Secondary | ICD-10-CM | POA: Diagnosis present

## 2021-01-26 DIAGNOSIS — E875 Hyperkalemia: Secondary | ICD-10-CM | POA: Diagnosis present

## 2021-01-26 DIAGNOSIS — Z794 Long term (current) use of insulin: Secondary | ICD-10-CM

## 2021-01-26 DIAGNOSIS — J441 Chronic obstructive pulmonary disease with (acute) exacerbation: Secondary | ICD-10-CM | POA: Diagnosis present

## 2021-01-26 DIAGNOSIS — L89322 Pressure ulcer of left buttock, stage 2: Secondary | ICD-10-CM | POA: Diagnosis present

## 2021-01-26 DIAGNOSIS — Z9981 Dependence on supplemental oxygen: Secondary | ICD-10-CM

## 2021-01-26 DIAGNOSIS — G894 Chronic pain syndrome: Secondary | ICD-10-CM | POA: Diagnosis present

## 2021-01-26 DIAGNOSIS — E1140 Type 2 diabetes mellitus with diabetic neuropathy, unspecified: Secondary | ICD-10-CM | POA: Diagnosis present

## 2021-01-26 DIAGNOSIS — I501 Left ventricular failure: Secondary | ICD-10-CM | POA: Diagnosis present

## 2021-01-26 DIAGNOSIS — R54 Age-related physical debility: Secondary | ICD-10-CM | POA: Diagnosis present

## 2021-01-26 DIAGNOSIS — Z9081 Acquired absence of spleen: Secondary | ICD-10-CM

## 2021-01-26 DIAGNOSIS — Z7189 Other specified counseling: Secondary | ICD-10-CM

## 2021-01-26 DIAGNOSIS — N179 Acute kidney failure, unspecified: Secondary | ICD-10-CM

## 2021-01-26 DIAGNOSIS — Z885 Allergy status to narcotic agent status: Secondary | ICD-10-CM

## 2021-01-26 DIAGNOSIS — I480 Paroxysmal atrial fibrillation: Secondary | ICD-10-CM | POA: Diagnosis present

## 2021-01-26 DIAGNOSIS — Z79899 Other long term (current) drug therapy: Secondary | ICD-10-CM

## 2021-01-26 DIAGNOSIS — I10 Essential (primary) hypertension: Secondary | ICD-10-CM | POA: Diagnosis present

## 2021-01-26 DIAGNOSIS — Z8673 Personal history of transient ischemic attack (TIA), and cerebral infarction without residual deficits: Secondary | ICD-10-CM

## 2021-01-26 DIAGNOSIS — J9621 Acute and chronic respiratory failure with hypoxia: Secondary | ICD-10-CM | POA: Diagnosis present

## 2021-01-26 DIAGNOSIS — I509 Heart failure, unspecified: Secondary | ICD-10-CM

## 2021-01-26 DIAGNOSIS — E1165 Type 2 diabetes mellitus with hyperglycemia: Secondary | ICD-10-CM

## 2021-01-26 DIAGNOSIS — N189 Chronic kidney disease, unspecified: Secondary | ICD-10-CM

## 2021-01-26 DIAGNOSIS — R29898 Other symptoms and signs involving the musculoskeletal system: Secondary | ICD-10-CM | POA: Diagnosis present

## 2021-01-26 DIAGNOSIS — Z8249 Family history of ischemic heart disease and other diseases of the circulatory system: Secondary | ICD-10-CM

## 2021-01-26 DIAGNOSIS — I2721 Secondary pulmonary arterial hypertension: Secondary | ICD-10-CM | POA: Diagnosis present

## 2021-01-26 DIAGNOSIS — E7800 Pure hypercholesterolemia, unspecified: Secondary | ICD-10-CM | POA: Diagnosis present

## 2021-01-26 DIAGNOSIS — I428 Other cardiomyopathies: Secondary | ICD-10-CM | POA: Diagnosis present

## 2021-01-26 DIAGNOSIS — E1169 Type 2 diabetes mellitus with other specified complication: Secondary | ICD-10-CM | POA: Diagnosis present

## 2021-01-26 DIAGNOSIS — N19 Unspecified kidney failure: Secondary | ICD-10-CM

## 2021-01-26 DIAGNOSIS — Z87891 Personal history of nicotine dependence: Secondary | ICD-10-CM

## 2021-01-26 DIAGNOSIS — I5082 Biventricular heart failure: Secondary | ICD-10-CM | POA: Diagnosis present

## 2021-01-26 DIAGNOSIS — I248 Other forms of acute ischemic heart disease: Secondary | ICD-10-CM | POA: Diagnosis present

## 2021-01-26 DIAGNOSIS — Z9119 Patient's noncompliance with other medical treatment and regimen: Secondary | ICD-10-CM

## 2021-01-26 DIAGNOSIS — Z993 Dependence on wheelchair: Secondary | ICD-10-CM

## 2021-01-26 DIAGNOSIS — I2510 Atherosclerotic heart disease of native coronary artery without angina pectoris: Secondary | ICD-10-CM | POA: Diagnosis present

## 2021-01-26 DIAGNOSIS — E1122 Type 2 diabetes mellitus with diabetic chronic kidney disease: Secondary | ICD-10-CM | POA: Diagnosis present

## 2021-01-26 DIAGNOSIS — E782 Mixed hyperlipidemia: Secondary | ICD-10-CM | POA: Diagnosis present

## 2021-01-26 HISTORY — DX: Left ventricular failure, unspecified: I50.1

## 2021-01-26 LAB — CBC WITH DIFFERENTIAL/PLATELET
Abs Immature Granulocytes: 0.04 10*3/uL (ref 0.00–0.07)
Basophils Absolute: 0 10*3/uL (ref 0.0–0.1)
Basophils Relative: 1 %
Eosinophils Absolute: 0.1 10*3/uL (ref 0.0–0.5)
Eosinophils Relative: 1 %
HCT: 30.1 % — ABNORMAL LOW (ref 39.0–52.0)
Hemoglobin: 8.9 g/dL — ABNORMAL LOW (ref 13.0–17.0)
Immature Granulocytes: 1 %
Lymphocytes Relative: 9 %
Lymphs Abs: 0.5 10*3/uL — ABNORMAL LOW (ref 0.7–4.0)
MCH: 25.8 pg — ABNORMAL LOW (ref 26.0–34.0)
MCHC: 29.6 g/dL — ABNORMAL LOW (ref 30.0–36.0)
MCV: 87.2 fL (ref 80.0–100.0)
Monocytes Absolute: 0.8 10*3/uL (ref 0.1–1.0)
Monocytes Relative: 13 %
Neutro Abs: 4.3 10*3/uL (ref 1.7–7.7)
Neutrophils Relative %: 75 %
Platelets: 402 10*3/uL — ABNORMAL HIGH (ref 150–400)
RBC: 3.45 MIL/uL — ABNORMAL LOW (ref 4.22–5.81)
RDW: 19.2 % — ABNORMAL HIGH (ref 11.5–15.5)
WBC: 5.8 10*3/uL (ref 4.0–10.5)
nRBC: 0.5 % — ABNORMAL HIGH (ref 0.0–0.2)

## 2021-01-26 LAB — COMPREHENSIVE METABOLIC PANEL
ALT: 18 U/L (ref 0–44)
AST: 48 U/L — ABNORMAL HIGH (ref 15–41)
Albumin: 2.8 g/dL — ABNORMAL LOW (ref 3.5–5.0)
Alkaline Phosphatase: 104 U/L (ref 38–126)
Anion gap: 9 (ref 5–15)
BUN: 72 mg/dL — ABNORMAL HIGH (ref 8–23)
CO2: 30 mmol/L (ref 22–32)
Calcium: 8.3 mg/dL — ABNORMAL LOW (ref 8.9–10.3)
Chloride: 93 mmol/L — ABNORMAL LOW (ref 98–111)
Creatinine, Ser: 3.5 mg/dL — ABNORMAL HIGH (ref 0.61–1.24)
GFR, Estimated: 18 mL/min — ABNORMAL LOW (ref >60)
Glucose, Bld: 199 mg/dL — ABNORMAL HIGH (ref 70–99)
Potassium: 6.2 mmol/L — ABNORMAL HIGH (ref 3.5–5.1)
Sodium: 132 mmol/L — ABNORMAL LOW (ref 135–145)
Total Bilirubin: 1.2 mg/dL (ref 0.3–1.2)
Total Protein: 6.9 g/dL (ref 6.5–8.1)

## 2021-01-26 LAB — BRAIN NATRIURETIC PEPTIDE: B Natriuretic Peptide: 3609.9 pg/mL — ABNORMAL HIGH (ref 0.0–100.0)

## 2021-01-26 LAB — TROPONIN I (HIGH SENSITIVITY): Troponin I (High Sensitivity): 41 ng/L — ABNORMAL HIGH (ref <18)

## 2021-01-26 LAB — LACTIC ACID, PLASMA: Lactic Acid, Venous: 2.3 mmol/L (ref 0.5–1.9)

## 2021-01-26 MED ORDER — INSULIN ASPART 100 UNIT/ML ~~LOC~~ SOLN: 5.0000 [IU] | Freq: Once | SUBCUTANEOUS | Status: DC

## 2021-01-26 MED ORDER — DEXTROSE 50 % IV SOLN
1.0000 | Freq: Once | INTRAVENOUS | Status: AC
  Administered 2021-01-26: 50 mL via INTRAVENOUS
  Filled 2021-01-26: qty 50

## 2021-01-26 MED ORDER — FUROSEMIDE 10 MG/ML IJ SOLN
80.0000 mg | Freq: Once | INTRAMUSCULAR | Status: AC
  Administered 2021-01-26: 80 mg via INTRAVENOUS
  Filled 2021-01-26: qty 8

## 2021-01-26 NOTE — ED Notes (Signed)
1 Set of blood cultures drawn

## 2021-01-26 NOTE — ED Triage Notes (Signed)
Pt arrived via EMS from home w/ complaint of SHoB x10 days. Hx of CHF. Pt complaint of edema. Pt took home breathing treatment prior to EMS arrival. Pt normally 92% on 4L at baseline. EMS noted pt was 86% on 4L via [place]. EMS placed pt on NRB at 15L, which brought pt up to 100%. EMS then placed pt on simple mask at 8L w/ O2 sats 94%. Pt refused EMS IV placement.

## 2021-01-26 NOTE — ED Provider Notes (Signed)
[facility] EMERGENCY DEPARTMENT Provider Note   CSN: [id number] Arrival date & time: 01/26/21  2047     History Chief Complaint  Patient presents with   Shortness of Breath    Nicholas Caldwell is a 75 y.o. male.  HPI      75 year old male with history of nonischemic cardiomyopathy with chronic respiratory failure on 4L O2, pulmonary hypertension, paroxysmal atrial fibrillation on eliquis, diabetes, hypertension, hyperlipidemia, nonsustained V. Tach, admission at the beginning of March with concern for acute decompensated heart failure with atrial flutter ablation March 10, who presents with concern for shortness of breath.  Reports 10 days of progressive dyspnea, orthopnea.  Has had worsening leg swelling.  Dyspnea at rest, worse laying down and with exertion.  Reports being on 4L O2.  Has been admitted approx once per month since November.  Last admission did discuss increasing home O2.  He reports chest tightness taht feels similar to prior CHF exacerbation.  Weight has been up over last few days. Is still making urine. Reports some cough not able to clear congestion out.  No fevers, no sick contacts.  Past Medical History:  Diagnosis Date   Atrial flutter ([place])    a. recurrent AFlutter with RVR;  b. Amiodarone Rx started 4/16   CAD (coronary artery disease)    a. LHC 1/16:  mLAD diffuse disease, pLCx mild disease, dLCx with disease but too small for PCI, RCA ok, EF 25-30%   Chronic pain    Chronic systolic CHF (congestive heart failure) (HCC)    COPD (chronic obstructive pulmonary disease) ([place])    Diabetes mellitus without complication ([place])    Ex-smoker 11/2017   Hypercholesteremia    Hypertension    NICM (nonischemic cardiomyopathy) ([place])    a.dx 2016. b. 2D echo 06/2016 - Last echo 07/01/16: mod dilated LV, mod LVH, EF 25-30%, mild-mod MR, sev LAE, mild-mod reduced RV systolic function, mild-mod TR, PASP 87mHG.   PAF (paroxysmal atrial  fibrillation) (HCC)    On amio - ot a candidate for flecainide due to cardiomyopathy, not a candidate for Tikosyn due to prolonged QT, and felt to be a poor candidate for ablation given left atrial size.   Pulmonary hypertension ([place]    Sleep apnea    Tobacco abuse     Patient Active Problem List   Diagnosis Date Noted   Uncontrolled type 2 diabetes mellitus with hyperglycemia, with long-term current use of insulin ([place] 01/27/2021   Leg weakness, bilateral 01/27/2021   Acute cardiogenic pulmonary edema ([place] 01/26/2021   Chronic kidney disease, stage 3b ([place] 01/06/2021   Severe sepsis ([place] 12/11/2020   Acute renal failure superimposed on stage 3b chronic kidney disease ([place] 12/11/2020   Abdominal pain 12/11/2020   Stage III pressure ulcer of sacral region ([place] 140/98/1191  Acute systolic CHF (congestive heart failure) ([place] 09/25/2020   Acute respiratory failure with hypercapnia ([place]    Encounter for intubation    Pressure ulcer of left buttock 08/21/2019   Cardiac arrest ([place] 08/20/2019   Palliative care by specialist    DNR (do not resuscitate) discussion    Fatigue    Acute CHF (congestive heart failure) ([place] 07/07/2019   Acute on chronic heart failure (H[place] 06/09/2019   Hyperkalemia 04/10/2019   Cerebral embolism with cerebral infarction 03/21/2019   Gastritis and gastroduodenitis    NSVT (nonsustained ventricular tachycardia) ([place] 03/08/2019   Tobacco abuse counseling 03/08/2019   Iron deficiency anemia    Acute on  chronic systolic CHF (congestive heart failure) ([place]) 03/06/2019   Mixed diabetic hyperlipidemia associated with type 2 diabetes mellitus ([place])    Primary osteoarthritis of right hip    Hypomagnesemia    Steroid-induced hyperglycemia    Supplemental oxygen dependent    Acute on chronic anemia    Diabetic peripheral neuropathy (HCC)    Acute on chronic systolic (congestive) heart failure (HCC)    Acute on chronic  respiratory failure ([place]) 01/14/2019   Hypoxia    Altered mental status    RVF (right ventricular failure) ([place]) 12/30/2018   AKI (acute kidney injury) ([place])    Acute respiratory failure ([place]) 11/22/2018   Acute on chronic respiratory failure with hypoxia and hypercapnia ([place]) 37/85/8850   Metabolic encephalopathy 27/74/1287   Acute respiratory failure with hypoxia and hypercapnia (HCC) 07/26/2018   Acute exacerbation of CHF (congestive heart failure) ([place]) 07/26/2018   Chronic respiratory failure with hypoxia ([place]) 86/76/7209   Acute metabolic encephalopathy    Atrial fibrillation with RVR (HCC)    SVT (supraventricular tachycardia) (HCC)    COPD GOLD 0    Medically noncompliant    Panlobular emphysema (HCC)    OSA (obstructive sleep apnea)    Pulmonary hypertension (HCC)    Disorientation    Pressure injury of skin 09/07/2016   Acute on chronic respiratory failure with hypoxia (HCC) 09/05/2016   Acute on chronic combined systolic and diastolic CHF (congestive heart failure) ([place]) 09/05/2016   Skin lesion-left heal 47/07/6282   Chronic systolic CHF (congestive heart failure) ([place]) 07/16/2016   COPD (chronic obstructive pulmonary disease) ([place]) 07/16/2016   Uncontrolled type 2 diabetes mellitus with complication (HCC)    Diabetic polyneuropathy associated with diabetes mellitus due to underlying condition ([place])    Normocytic anemia 06/29/2016   Coronary artery disease involving native coronary artery of native heart 01/21/2016   Nonischemic cardiomyopathy ([place]) 10/09/2015   PAF (paroxysmal atrial fibrillation) (HCC)    Lactic acidosis 02/19/2015   Chronic pain 02/19/2015   Morbid obesity ([place]) 02/13/2015   Dyspnea    Elevated troponin I level 11/01/2014   COPD with acute exacerbation ([place]) 11/01/2014   Essential hypertension 10/31/2014   Type 2 diabetes mellitus with neuropathy 10/31/2014   Hyperlipidemia  10/31/2014   Cigarette smoker  10/31/2014    Past Surgical History:  Procedure Laterality Date   BIOPSY  03/11/2019   Procedure: BIOPSY;  Surgeon: Lavena Bullion, DO;  Location: [place];  Service: Gastroenterology;;   BIOPSY  03/15/2019   Procedure: BIOPSY;  Surgeon: Irving Copas., MD;  Location: [place];  Service: Gastroenterology;;   CARDIOVERSION N/A 07/30/2018   Procedure: CARDIOVERSION;  Surgeon: Larey Dresser, MD;  Location: [place];  Service: Cardiovascular;  Laterality: N/A;   CARDIOVERSION N/A 12/17/2020   Procedure: CARDIOVERSION;  Surgeon: Larey Dresser, MD;  Location: [facility] ENDOSCOPY;  Service: Cardiovascular;  Laterality: N/A;   CARDIOVERSION N/A 01/10/2021   Procedure: CARDIOVERSION;  Surgeon: Larey Dresser, MD;  Location: [place];  Service: Cardiovascular;  Laterality: N/A;   COLONOSCOPY N/A 03/11/2019   Procedure: COLONOSCOPY;  Surgeon: Lavena Bullion, DO;  Location: [facility] ENDOSCOPY;  Service: Gastroenterology;  Laterality: N/A;   ENTEROSCOPY N/A 03/15/2019   Procedure: ENTEROSCOPY;  Surgeon: Rush Landmark Telford Nab., MD;  Location: [place];  Service: Gastroenterology;  Laterality: N/A;   ESOPHAGOGASTRODUODENOSCOPY Left 03/11/2019   Procedure: ESOPHAGOGASTRODUODENOSCOPY (EGD);  Surgeon: Lavena Bullion, DO;  Location: [facility] ENDOSCOPY;  Service: Gastroenterology;  Laterality: Left;   LEFT AND  RIGHT HEART CATHETERIZATION WITH CORONARY ANGIOGRAM N/A 11/06/2014   Procedure: LEFT AND RIGHT HEART CATHETERIZATION WITH CORONARY ANGIOGRAM;  Surgeon: Jettie Booze, MD;  Location: [facility] CATH LAB;  Service: Cardiovascular;  Laterality: N/A;   RIGHT/LEFT HEART CATH AND CORONARY ANGIOGRAPHY N/A 12/06/2018   Procedure: RIGHT/LEFT HEART CATH AND CORONARY ANGIOGRAPHY;  Surgeon: Larey Dresser, MD;  Location: [place] CV LAB;  Service: Cardiovascular;  Laterality: N/A;   SPLENECTOMY     SUBMUCOSAL TATTOO INJECTION  03/15/2019   Procedure: SUBMUCOSAL TATTOO INJECTION;   Surgeon: Irving Copas., MD;  Location: [place];  Service: Gastroenterology;;   TEE WITHOUT CARDIOVERSION N/A 12/17/2020   Procedure: TRANSESOPHAGEAL ECHOCARDIOGRAM (TEE);  Surgeon: Larey Dresser, MD;  Location: [facility] ENDOSCOPY;  Service: Cardiovascular;  Laterality: N/A;       Family History  Problem Relation Age of Onset   Heart disease Mother    Hypertension Mother    Heart failure Mother    Heart disease Father    Hypertension Sister    Heart attack Neg Hx    Stroke Neg Hx    Colon cancer Neg Hx    Esophageal cancer Neg Hx     Social History   Tobacco Use   Smoking status: Former Smoker    Packs/day: 1.00    Years: 34.00    Pack years: 34.00    Types: Cigarettes    Quit date: 11/30/2018    Years since quitting: 2.1   Smokeless tobacco: Never Used  Vaping Use   Vaping Use: Never used  Substance Use Topics   Alcohol use: No    Alcohol/week: 0.0 standard drinks   Drug use: No    Home Medications Prior to Admission medications   Medication Sig Start Date End Date Taking? Authorizing Provider  acetaminophen (TYLENOL) 325 MG tablet Take 2 tablets (650 mg total) by mouth every 4 (four) hours as needed for headache or mild pain. 10/09/20   Geradine Girt, DO  albuterol (VENTOLIN HFA) 108 (90 Base) MCG/ACT inhaler Inhale 2 puffs into the lungs every 6 (six) hours as needed for wheezing or shortness of breath.    [provider]  amiodarone (PACERONE) 200 MG tablet Take 1 tablet (200 mg total) by mouth daily. 01/11/21   Nita Sells, MD  apixaban (ELIQUIS) 5 MG TABS tablet Take 1 tablet (5 mg total) by mouth 2 (two) times daily. 12/19/20 01/18/21  Darliss Cheney, MD  atorvastatin (LIPITOR) 40 MG tablet Take 1 tablet (40 mg total) by mouth daily. Patient taking differently: Take 40 mg by mouth at bedtime. 10/30/20   Larey Dresser, MD  Budeson-Glycopyrrol-Formoterol (BREZTRI AEROSPHERE) 160-9-4.8 MCG/ACT AERO Inhale 2 puffs into the  lungs 2 (two) times daily. 06/21/20   Tanda Rockers, MD  carbamide peroxide (DEBROX) 6.5 % OTIC solution Place 5-10 drops into both ears 3 (three) times daily as needed (ear wax).    [provider]  Carboxymethylcellulose Sod PF 0.25 % SOLN Place 1 drop into both eyes 2 (two) times daily as needed (dry eyes).    [provider]  Cholecalciferol (VITAMIN D) 50 MCG (2000 UT) CAPS Take 2,000 Units by mouth daily.    [provider]  famotidine (PEPCID) 20 MG tablet TAKE 1 TABLET(20 MG) BY MOUTH TWICE DAILY Patient taking differently: Take 20 mg by mouth 2 (two) times daily as needed for indigestion. 10/29/20   Larey Dresser, MD  feeding supplement, GLUCERNA SHAKE, (GLUCERNA SHAKE) LIQD Take 237 mLs  by mouth 3 (three) times daily between meals. 11/27/20   Swayze, Ava, DO  ferrous sulfate 325 (65 FE) MG tablet Take 325 mg by mouth every Monday, Wednesday, and Friday at 6 PM.    [provider]  fluticasone (FLONASE) 50 MCG/ACT nasal spray Place 2 sprays into both nostrils daily as needed for allergies.    [provider]  hydrocortisone (ANUSOL-HC) 2.5 % rectal cream Place 1 application rectally 2 (two) times daily as needed for hemorrhoids or itching.     [provider]  insulin aspart protamine- aspart (NOVOLOG MIX 70/30) (70-30) 100 UNIT/ML injection Inject 10 Units into the skin 2 (two) times daily with a meal.    [provider]  Insulin Syringe 27G X 1/2" 0.5 ML MISC 100 Syringes by Does not apply route 3 (three) times daily. 08/29/19   Hosie Poisson, MD  ipratropium-albuterol (DUONEB) 0.5-2.5 (3) MG/3ML SOLN Take 3 mLs by nebulization 4 (four) times daily as needed.    [provider]  latanoprost (XALATAN) 0.005 % ophthalmic solution Place 1 drop into both eyes at bedtime.    [provider]  lidocaine (LIDODERM) 5 % Place 1 patch onto the skin daily. Remove & Discard patch within 12 hours or as directed by MD  10/10/20   Geradine Girt, DO  Nystatin (GERHARDT'S BUTT CREAM) CREA Apply 1 application topically 2 (two) times daily. 01/10/21   Nita Sells, MD  oxybutynin (DITROPAN) 5 MG tablet Take 1 tablet by mouth 4 (four) times daily as needed for incontinence. 07/03/20   [provider]  oxyCODONE-acetaminophen (PERCOCET) 10-325 MG tablet Take 1 tablet by mouth every 6 (six) hours as needed for pain.    [provider]  OXYGEN Inhale 3 L/min into the lungs continuous.    [provider]  pantoprazole (PROTONIX) 40 MG tablet Take 40 mg by mouth 2 (two) times daily. 07/03/20   [provider]  polyethylene glycol powder (GLYCOLAX/MIRALAX) 17 GM/SCOOP powder Take 17 g by mouth daily as needed for constipation. 07/03/20   [provider]  potassium chloride SA (KLOR-CON) 20 MEQ tablet Take 1 tablet (20 mEq total) by mouth 2 (two) times daily. 01/10/21   Nita Sells, MD  senna-docusate (SENOKOT-S) 8.6-50 MG tablet Take 1 tablet by mouth at bedtime as needed for mild constipation. 07/03/20   [provider]  tamsulosin (FLOMAX) 0.4 MG CAPS capsule Take 1 capsule (0.4 mg total) by mouth daily. Patient taking differently: Take 0.4 mg by mouth daily after breakfast. 10/09/20   Geradine Girt, DO  terbinafine (LAMISIL) 1 % cream Apply 1 application topically daily. 03/10/20   [provider]  torsemide 40 MG TABS Take 80 mg by mouth 2 (two) times daily. 01/10/21   Nita Sells, MD  umeclidinium bromide (INCRUSE ELLIPTA) 62.5 MCG/INH AEPB Inhale 1 puff into the lungs daily. Patient not taking: Reported on 01/11/2021 11/28/20   Swayze, Ava, DO    Allergies    Isosorb dinitrate-hydralazine, Benazepril, Brimonidine, Metformin, and Morphine  Review of Systems   Review of Systems  Constitutional: Positive for fever.  HENT: Negative for sore throat.   Eyes: Negative for visual disturbance.  Respiratory: Positive for cough, chest tightness  and shortness of breath.   Cardiovascular: Positive for chest pain (tightness) and leg swelling.  Gastrointestinal: Positive for nausea. Negative for abdominal pain, diarrhea and vomiting.  Genitourinary: Negative for difficulty urinating.  Musculoskeletal: Negative for back pain and neck stiffness.  Skin: Negative for  rash.  Neurological: Negative for syncope and headaches.    Physical Exam Updated Vital Signs BP 108/64 (BP Location: Right Arm)    Pulse 69    Temp 98.6 F (37 C) (Oral)    Resp 17    Ht 6' (1.829 m)    Wt 109.5 kg    SpO2 94%    BMI 32.74 kg/m   Physical Exam Vitals and nursing note reviewed.  Constitutional:      General: He is not in acute distress.    Appearance: He is well-developed. He is not diaphoretic.  HENT:     Head: Normocephalic and atraumatic.  Eyes:     Conjunctiva/sclera: Conjunctivae normal.  Neck:     Vascular: JVD present.  Cardiovascular:     Rate and Rhythm: Normal rate and regular rhythm.     Heart sounds: Normal heart sounds. No murmur heard. No friction rub. No gallop.   Pulmonary:     Effort: Pulmonary effort is normal. No respiratory distress.     Breath sounds: Decreased breath sounds present. No wheezing or rales.  Abdominal:     General: There is no distension.     Palpations: Abdomen is soft.     Tenderness: There is no abdominal tenderness. There is no guarding.  Musculoskeletal:     Cervical back: Normal range of motion.     Right lower leg: Edema present.     Left lower leg: Edema present.  Skin:    General: Skin is warm and dry.  Neurological:     Mental Status: He is alert and oriented to person, place, and time.     ED Results / Procedures / Treatments   Labs (all labs ordered are listed, but only abnormal results are displayed) Labs Reviewed  CBC WITH DIFFERENTIAL/PLATELET - Abnormal; Notable for the following components:      Result Value   RBC 3.45 (*)    Hemoglobin 8.9 (*)    HCT 30.1 (*)    MCH 25.8 (*)     MCHC 29.6 (*)    RDW 19.2 (*)    Platelets 402 (*)    nRBC 0.5 (*)    Lymphs Abs 0.5 (*)    All other components within normal limits  COMPREHENSIVE METABOLIC PANEL - Abnormal; Notable for the following components:   Sodium 132 (*)    Potassium 6.2 (*)    Chloride 93 (*)    Glucose, Bld 199 (*)    BUN 72 (*)    Creatinine, Ser 3.50 (*)    Calcium 8.3 (*)    Albumin 2.8 (*)    AST 48 (*)    GFR, Estimated 18 (*)    All other components within normal limits  BRAIN NATRIURETIC PEPTIDE - Abnormal; Notable for the following components:   B Natriuretic Peptide 3,609.9 (*)    All other components within normal limits  LACTIC ACID, PLASMA - Abnormal; Notable for the following components:   Lactic Acid, Venous 2.3 (*)    All other components within normal limits  BASIC METABOLIC PANEL - Abnormal; Notable for the following components:   Potassium 3.4 (*)    Chloride 95 (*)    Glucose, Bld 139 (*)    BUN 70 (*)    Creatinine, Ser 3.42 (*)    Calcium 8.5 (*)    GFR, Estimated 18 (*)    All other components within normal limits  CBC WITH DIFFERENTIAL/PLATELET - Abnormal; Notable for the following components:  RBC 3.13 (*)    Hemoglobin 8.2 (*)    HCT 26.8 (*)    RDW 18.9 (*)    Lymphs Abs 0.6 (*)    Monocytes Absolute 1.1 (*)    All other components within normal limits  COMPREHENSIVE METABOLIC PANEL - Abnormal; Notable for the following components:   Potassium 3.4 (*)    Chloride 95 (*)    Glucose, Bld 127 (*)    BUN 70 (*)    Creatinine, Ser 3.36 (*)    Calcium 8.4 (*)    Albumin 2.6 (*)    GFR, Estimated 19 (*)    All other components within normal limits  GLUCOSE, CAPILLARY - Abnormal; Notable for the following components:   Glucose-Capillary 105 (*)    All other components within normal limits  GLUCOSE, CAPILLARY - Abnormal; Notable for the following components:   Glucose-Capillary 124 (*)    All other components within normal limits  TROPONIN I (HIGH SENSITIVITY)  - Abnormal; Notable for the following components:   Troponin I (High Sensitivity) 41 (*)    All other components within normal limits  TROPONIN I (HIGH SENSITIVITY) - Abnormal; Notable for the following components:   Troponin I (High Sensitivity) 47 (*)    All other components within normal limits  RESP PANEL BY RT-PCR (FLU A&B, COVID) ARPGX2  MRSA PCR SCREENING  LACTIC ACID, PLASMA  MAGNESIUM  C-REACTIVE PROTEIN  PROCALCITONIN  SODIUM, URINE, RANDOM  CREATININE, URINE, RANDOM  UREA NITROGEN, URINE    EKG EKG Interpretation  Date/Time:  Saturday January 26 2021 21:15:46 EDT Ventricular Rate:  78 PR Interval:    QRS Duration: 122 QT Interval:  369 QTC Calculation: 421 R Axis:   114 Text Interpretation: Sinus rhythm Ventricular premature complex Prolonged PR interval Probable left atrial enlargement IVCD, consider atypical RBBB Nonspecific T abnormalities, lateral leads No significant change since last tracing Confirmed by Gareth Morgan [phone]) on 01/26/2021 11:42:36 PM   Radiology US RENAL  Result Date: 01/27/2021 CLINICAL DATA:  75 year old male with renal failure. EXAM: RENAL / URINARY TRACT ULTRASOUND COMPLETE COMPARISON:  12/11/2020 FINDINGS: Right Kidney: Renal measurements: 10.3 x 4.7 x 5.9 cm = volume: 147 mL. Echogenicity within normal limits. No mass or hydronephrosis visualized. Left Kidney: Renal measurements: 11.1 x 6.3 x 4.8 cm = volume: 175 mL. Echogenicity within normal limits. No solid mass or hydronephrosis visualized. Two cysts within the LEFT kidney are noted, measuring 1.1 and 2 cm. Bladder: Appears normal for degree of bladder distention. Other: None. IMPRESSION: Unremarkable kidneys except for 2 LEFT renal cysts. Electronically Signed   By: Margarette Canada M.D.   On: 01/27/2021 09:48   DG Chest Portable 1 View  Result Date: 01/26/2021 CLINICAL DATA:  Shortness of breath. EXAM: PORTABLE CHEST 1 VIEW COMPARISON:  Most recent radiograph 01/05/2021. Chest CT  08/09/2019 reviewed FINDINGS: Chronic cardiomegaly. Progressive pulmonary edema. Retrocardiac opacity likely combination of pleural effusion and atelectasis/airspace disease. Increased right pleural effusion from prior exam. No pneumothorax. IMPRESSION: 1. Progressive pulmonary edema and right pleural effusion from prior exam. 2. Retrocardiac opacity likely combination of pleural fluid and atelectasis/airspace disease. 3. Chronic cardiomegaly. Electronically Signed   By: Keith Rake M.D.   On: 01/26/2021 21:33    Procedures Procedures   Medications Ordered in ED Medications  insulin aspart (novoLOG) injection 5 Units (has no administration in time range)  amiodarone (PACERONE) tablet 200 mg (200 mg Oral Given 01/27/21 1009)  atorvastatin (LIPITOR) tablet 40 mg (40 mg Oral Not  Given 01/27/21 0208)  famotidine (PEPCID) tablet 20 mg (has no administration in time range)  pantoprazole (PROTONIX) EC tablet 40 mg (40 mg Oral Given 01/27/21 1006)  tamsulosin (FLOMAX) capsule 0.4 mg (0.4 mg Oral Given 01/27/21 1006)  apixaban (ELIQUIS) tablet 5 mg (5 mg Oral Given 01/27/21 1006)  feeding supplement (GLUCERNA SHAKE) (GLUCERNA SHAKE) liquid 237 mL (has no administration in time range)  fluticasone (FLONASE) 50 MCG/ACT nasal spray 2 spray (2 sprays Each Nare Given 01/27/21 0852)  ipratropium-albuterol (DUONEB) 0.5-2.5 (3) MG/3ML nebulizer solution 3 mL (has no administration in time range)  polyvinyl alcohol (LIQUIFILM TEARS) 1.4 % ophthalmic solution 1 drop (has no administration in time range)  latanoprost (XALATAN) 0.005 % ophthalmic solution 1 drop (1 drop Both Eyes Not Given 01/27/21 0209)  sodium chloride flush (NS) 0.9 % injection 3 mL (3 mLs Intravenous Given 01/27/21 0842)  sodium chloride flush (NS) 0.9 % injection 3 mL (has no administration in time range)  0.9 %  sodium chloride infusion (has no administration in time range)  acetaminophen (TYLENOL) tablet 650 mg (has no administration in time  range)  ondansetron (ZOFRAN) injection 4 mg (has no administration in time range)  furosemide (LASIX) injection 80 mg (80 mg Intravenous Given 01/27/21 0753)  umeclidinium bromide (INCRUSE ELLIPTA) 62.5 MCG/INH 1 puff (1 puff Inhalation Given 01/27/21 0823)  ipratropium-albuterol (DUONEB) 0.5-2.5 (3) MG/3ML nebulizer solution 3 mL (3 mLs Nebulization Given 01/27/21 0823)  oxyCODONE-acetaminophen (PERCOCET/ROXICET) 5-325 MG per tablet 1 tablet (1 tablet Oral Given 01/27/21 0847)    And  oxyCODONE (Oxy IR/ROXICODONE) immediate release tablet 5 mg (5 mg Oral Given 01/27/21 0847)  metolazone (ZAROXOLYN) tablet 5 mg (has no administration in time range)  arformoterol (BROVANA) nebulizer solution 15 mcg (15 mcg Nebulization Given 01/27/21 1058)  budesonide (PULMICORT) nebulizer solution 0.5 mg (0.5 mg Nebulization Given 01/27/21 1058)  methylPREDNISolone sodium succinate (SOLU-MEDROL) 125 mg/2 mL injection 60 mg (has no administration in time range)  insulin detemir (LEVEMIR) injection 12 Units (has no administration in time range)  insulin aspart (novoLOG) injection 0-15 Units (has no administration in time range)  insulin aspart (novoLOG) injection 0-5 Units (has no administration in time range)  insulin aspart (novoLOG) injection 3 Units (has no administration in time range)  furosemide (LASIX) injection 80 mg (80 mg Intravenous Given 01/26/21 2350)  dextrose 50 % solution 50 mL (50 mLs Intravenous Given 01/26/21 2349)  potassium chloride SA (KLOR-CON) CR tablet 40 mEq (40 mEq Oral Given 01/27/21 1144)    ED Course  I have reviewed the triage vital signs and the nursing notes.  Pertinent labs & imaging results that were available during my care of the patient were reviewed by me and considered in my medical decision making (see chart for details).    MDM Rules/Calculators/A&P                          75 year old male with history of nonischemic cardiomyopathy with chronic respiratory failure on 4L  O2, pulmonary hypertension, paroxysmal atrial fibrillation on eliquis, diabetes, hypertension, hyperlipidemia, nonsustained V. Tach, admission at the beginning of March with concern for acute decompensated heart failure with atrial flutter ablation March 10, who presents with concern for shortness of breath.  Differential diagnosis for dyspnea includes ACS, PE, COPD exacerbation, CHF exacerbation, anemia, pneumonia, viral etiology such as COVID 19 infection, metabolic abnormality.  Chest x-ray was done which showed pulmonary edema.   EKG was evaluated by me which  showed no acute changes in comparison to prior.  BNP 3600. Troponin with elevation similar to prior, feel presentation more consistent with CHF than ACS. No fever and doubt pneumonia or infectious etiology.  On anticoagulation, doubt PE.   Labs show hemolyzed K with mild elevation, will treat with insulin/dextrose and resend lab.  Labs also significant for acute on chronic kidney failure with Cr 3.  Suspect this worsening renal failure is in the setting of CHF as history, XR, physical exam all consistent with volume overload. Ordered 70m IV lasix and will admit for further care.       Final Clinical Impression(s) / ED Diagnoses Final diagnoses:  Acute on chronic congestive heart failure, unspecified heart failure type ([place]  Acute renal failure superimposed on chronic kidney disease, unspecified CKD stage, unspecified acute renal failure type ([facility]    Rx / DC Orders ED Discharge Orders    None       SGareth Morgan MD 01/27/21 1148

## 2021-01-27 ENCOUNTER — Observation Stay (HOSPITAL_COMMUNITY): Payer: No Typology Code available for payment source

## 2021-01-27 ENCOUNTER — Encounter (HOSPITAL_COMMUNITY): Payer: Self-pay | Admitting: Family Medicine

## 2021-01-27 DIAGNOSIS — E1165 Type 2 diabetes mellitus with hyperglycemia: Secondary | ICD-10-CM

## 2021-01-27 DIAGNOSIS — Z7401 Bed confinement status: Secondary | ICD-10-CM | POA: Diagnosis not present

## 2021-01-27 DIAGNOSIS — I480 Paroxysmal atrial fibrillation: Secondary | ICD-10-CM | POA: Diagnosis present

## 2021-01-27 DIAGNOSIS — I501 Left ventricular failure: Secondary | ICD-10-CM | POA: Diagnosis not present

## 2021-01-27 DIAGNOSIS — Z993 Dependence on wheelchair: Secondary | ICD-10-CM | POA: Diagnosis not present

## 2021-01-27 DIAGNOSIS — E782 Mixed hyperlipidemia: Secondary | ICD-10-CM

## 2021-01-27 DIAGNOSIS — I5043 Acute on chronic combined systolic (congestive) and diastolic (congestive) heart failure: Secondary | ICD-10-CM | POA: Diagnosis present

## 2021-01-27 DIAGNOSIS — R5381 Other malaise: Secondary | ICD-10-CM

## 2021-01-27 DIAGNOSIS — E1169 Type 2 diabetes mellitus with other specified complication: Secondary | ICD-10-CM

## 2021-01-27 DIAGNOSIS — G4733 Obstructive sleep apnea (adult) (pediatric): Secondary | ICD-10-CM

## 2021-01-27 DIAGNOSIS — R0602 Shortness of breath: Secondary | ICD-10-CM | POA: Diagnosis present

## 2021-01-27 DIAGNOSIS — L89322 Pressure ulcer of left buttock, stage 2: Secondary | ICD-10-CM

## 2021-01-27 DIAGNOSIS — E875 Hyperkalemia: Secondary | ICD-10-CM | POA: Diagnosis present

## 2021-01-27 DIAGNOSIS — N189 Chronic kidney disease, unspecified: Secondary | ICD-10-CM | POA: Diagnosis not present

## 2021-01-27 DIAGNOSIS — L89312 Pressure ulcer of right buttock, stage 2: Secondary | ICD-10-CM | POA: Diagnosis present

## 2021-01-27 DIAGNOSIS — J9621 Acute and chronic respiratory failure with hypoxia: Secondary | ICD-10-CM

## 2021-01-27 DIAGNOSIS — N400 Enlarged prostate without lower urinary tract symptoms: Secondary | ICD-10-CM | POA: Diagnosis present

## 2021-01-27 DIAGNOSIS — E1140 Type 2 diabetes mellitus with diabetic neuropathy, unspecified: Secondary | ICD-10-CM | POA: Diagnosis present

## 2021-01-27 DIAGNOSIS — G894 Chronic pain syndrome: Secondary | ICD-10-CM | POA: Diagnosis present

## 2021-01-27 DIAGNOSIS — I248 Other forms of acute ischemic heart disease: Secondary | ICD-10-CM | POA: Diagnosis present

## 2021-01-27 DIAGNOSIS — I10 Essential (primary) hypertension: Secondary | ICD-10-CM

## 2021-01-27 DIAGNOSIS — E872 Acidosis: Secondary | ICD-10-CM | POA: Diagnosis present

## 2021-01-27 DIAGNOSIS — I2510 Atherosclerotic heart disease of native coronary artery without angina pectoris: Secondary | ICD-10-CM | POA: Diagnosis present

## 2021-01-27 DIAGNOSIS — N179 Acute kidney failure, unspecified: Secondary | ICD-10-CM | POA: Diagnosis present

## 2021-01-27 DIAGNOSIS — E7800 Pure hypercholesterolemia, unspecified: Secondary | ICD-10-CM | POA: Diagnosis present

## 2021-01-27 DIAGNOSIS — J441 Chronic obstructive pulmonary disease with (acute) exacerbation: Secondary | ICD-10-CM | POA: Diagnosis not present

## 2021-01-27 DIAGNOSIS — N1832 Chronic kidney disease, stage 3b: Secondary | ICD-10-CM

## 2021-01-27 DIAGNOSIS — R29898 Other symptoms and signs involving the musculoskeletal system: Secondary | ICD-10-CM

## 2021-01-27 DIAGNOSIS — Z794 Long term (current) use of insulin: Secondary | ICD-10-CM

## 2021-01-27 DIAGNOSIS — R609 Edema, unspecified: Secondary | ICD-10-CM

## 2021-01-27 DIAGNOSIS — E785 Hyperlipidemia, unspecified: Secondary | ICD-10-CM | POA: Diagnosis present

## 2021-01-27 DIAGNOSIS — Z20822 Contact with and (suspected) exposure to covid-19: Secondary | ICD-10-CM | POA: Diagnosis present

## 2021-01-27 DIAGNOSIS — I2721 Secondary pulmonary arterial hypertension: Secondary | ICD-10-CM | POA: Diagnosis present

## 2021-01-27 DIAGNOSIS — I428 Other cardiomyopathies: Secondary | ICD-10-CM | POA: Diagnosis present

## 2021-01-27 DIAGNOSIS — Z9981 Dependence on supplemental oxygen: Secondary | ICD-10-CM | POA: Diagnosis not present

## 2021-01-27 DIAGNOSIS — I130 Hypertensive heart and chronic kidney disease with heart failure and stage 1 through stage 4 chronic kidney disease, or unspecified chronic kidney disease: Secondary | ICD-10-CM | POA: Diagnosis present

## 2021-01-27 LAB — CBC WITH DIFFERENTIAL/PLATELET
Abs Immature Granulocytes: 0.02 10*3/uL (ref 0.00–0.07)
Basophils Absolute: 0.1 10*3/uL (ref 0.0–0.1)
Basophils Relative: 1 %
Eosinophils Absolute: 0.1 10*3/uL (ref 0.0–0.5)
Eosinophils Relative: 1 %
HCT: 26.8 % — ABNORMAL LOW (ref 39.0–52.0)
Hemoglobin: 8.2 g/dL — ABNORMAL LOW (ref 13.0–17.0)
Immature Granulocytes: 0 %
Lymphocytes Relative: 11 %
Lymphs Abs: 0.6 10*3/uL — ABNORMAL LOW (ref 0.7–4.0)
MCH: 26.2 pg (ref 26.0–34.0)
MCHC: 30.6 g/dL (ref 30.0–36.0)
MCV: 85.6 fL (ref 80.0–100.0)
Monocytes Absolute: 1.1 10*3/uL — ABNORMAL HIGH (ref 0.1–1.0)
Monocytes Relative: 19 %
Neutro Abs: 3.7 10*3/uL (ref 1.7–7.7)
Neutrophils Relative %: 68 %
Platelets: 376 10*3/uL (ref 150–400)
RBC: 3.13 MIL/uL — ABNORMAL LOW (ref 4.22–5.81)
RDW: 18.9 % — ABNORMAL HIGH (ref 11.5–15.5)
WBC: 5.5 10*3/uL (ref 4.0–10.5)
nRBC: 0 % (ref 0.0–0.2)

## 2021-01-27 LAB — GLUCOSE, CAPILLARY
Glucose-Capillary: 105 mg/dL — ABNORMAL HIGH (ref 70–99)
Glucose-Capillary: 124 mg/dL — ABNORMAL HIGH (ref 70–99)
Glucose-Capillary: 141 mg/dL — ABNORMAL HIGH (ref 70–99)
Glucose-Capillary: 179 mg/dL — ABNORMAL HIGH (ref 70–99)

## 2021-01-27 LAB — RESP PANEL BY RT-PCR (FLU A&B, COVID) ARPGX2
Influenza A by PCR: NEGATIVE
Influenza B by PCR: NEGATIVE
SARS Coronavirus 2 by RT PCR: NEGATIVE

## 2021-01-27 LAB — BASIC METABOLIC PANEL
Anion gap: 10 (ref 5–15)
BUN: 70 mg/dL — ABNORMAL HIGH (ref 8–23)
CO2: 31 mmol/L (ref 22–32)
Calcium: 8.5 mg/dL — ABNORMAL LOW (ref 8.9–10.3)
Chloride: 95 mmol/L — ABNORMAL LOW (ref 98–111)
Creatinine, Ser: 3.42 mg/dL — ABNORMAL HIGH (ref 0.61–1.24)
GFR, Estimated: 18 mL/min — ABNORMAL LOW (ref >60)
Glucose, Bld: 139 mg/dL — ABNORMAL HIGH (ref 70–99)
Potassium: 3.4 mmol/L — ABNORMAL LOW (ref 3.5–5.1)
Sodium: 136 mmol/L (ref 135–145)

## 2021-01-27 LAB — COMPREHENSIVE METABOLIC PANEL
ALT: 12 U/L (ref 0–44)
AST: 19 U/L (ref 15–41)
Albumin: 2.6 g/dL — ABNORMAL LOW (ref 3.5–5.0)
Alkaline Phosphatase: 88 U/L (ref 38–126)
Anion gap: 11 (ref 5–15)
BUN: 70 mg/dL — ABNORMAL HIGH (ref 8–23)
CO2: 31 mmol/L (ref 22–32)
Calcium: 8.4 mg/dL — ABNORMAL LOW (ref 8.9–10.3)
Chloride: 95 mmol/L — ABNORMAL LOW (ref 98–111)
Creatinine, Ser: 3.36 mg/dL — ABNORMAL HIGH (ref 0.61–1.24)
GFR, Estimated: 19 mL/min — ABNORMAL LOW (ref >60)
Glucose, Bld: 127 mg/dL — ABNORMAL HIGH (ref 70–99)
Potassium: 3.4 mmol/L — ABNORMAL LOW (ref 3.5–5.1)
Sodium: 137 mmol/L (ref 135–145)
Total Bilirubin: 0.3 mg/dL (ref 0.3–1.2)
Total Protein: 6.5 g/dL (ref 6.5–8.1)

## 2021-01-27 LAB — MAGNESIUM: Magnesium: 2.1 mg/dL (ref 1.7–2.4)

## 2021-01-27 LAB — SODIUM, URINE, RANDOM: Sodium, Ur: 52 mmol/L

## 2021-01-27 LAB — CREATININE, URINE, RANDOM: Creatinine, Urine: 25.15 mg/dL

## 2021-01-27 LAB — PROCALCITONIN: Procalcitonin: 0.5 ng/mL

## 2021-01-27 LAB — MRSA PCR SCREENING: MRSA by PCR: NEGATIVE

## 2021-01-27 LAB — TROPONIN I (HIGH SENSITIVITY): Troponin I (High Sensitivity): 47 ng/L — ABNORMAL HIGH (ref <18)

## 2021-01-27 LAB — LACTIC ACID, PLASMA: Lactic Acid, Venous: 1.5 mmol/L (ref 0.5–1.9)

## 2021-01-27 LAB — C-REACTIVE PROTEIN: CRP: 0.8 mg/dL (ref <1.0)

## 2021-01-27 IMAGING — DX DG CHEST 1V PORT
1 series · 1 of 1 positions shown · non-contrast
Comparison: Radiograph 08/23/2019 CT 08/09/2019

CLINICAL DATA: Respiratory failure. Endotracheal tube present.

EXAM:
PORTABLE CHEST 1 VIEW

[chest ap]
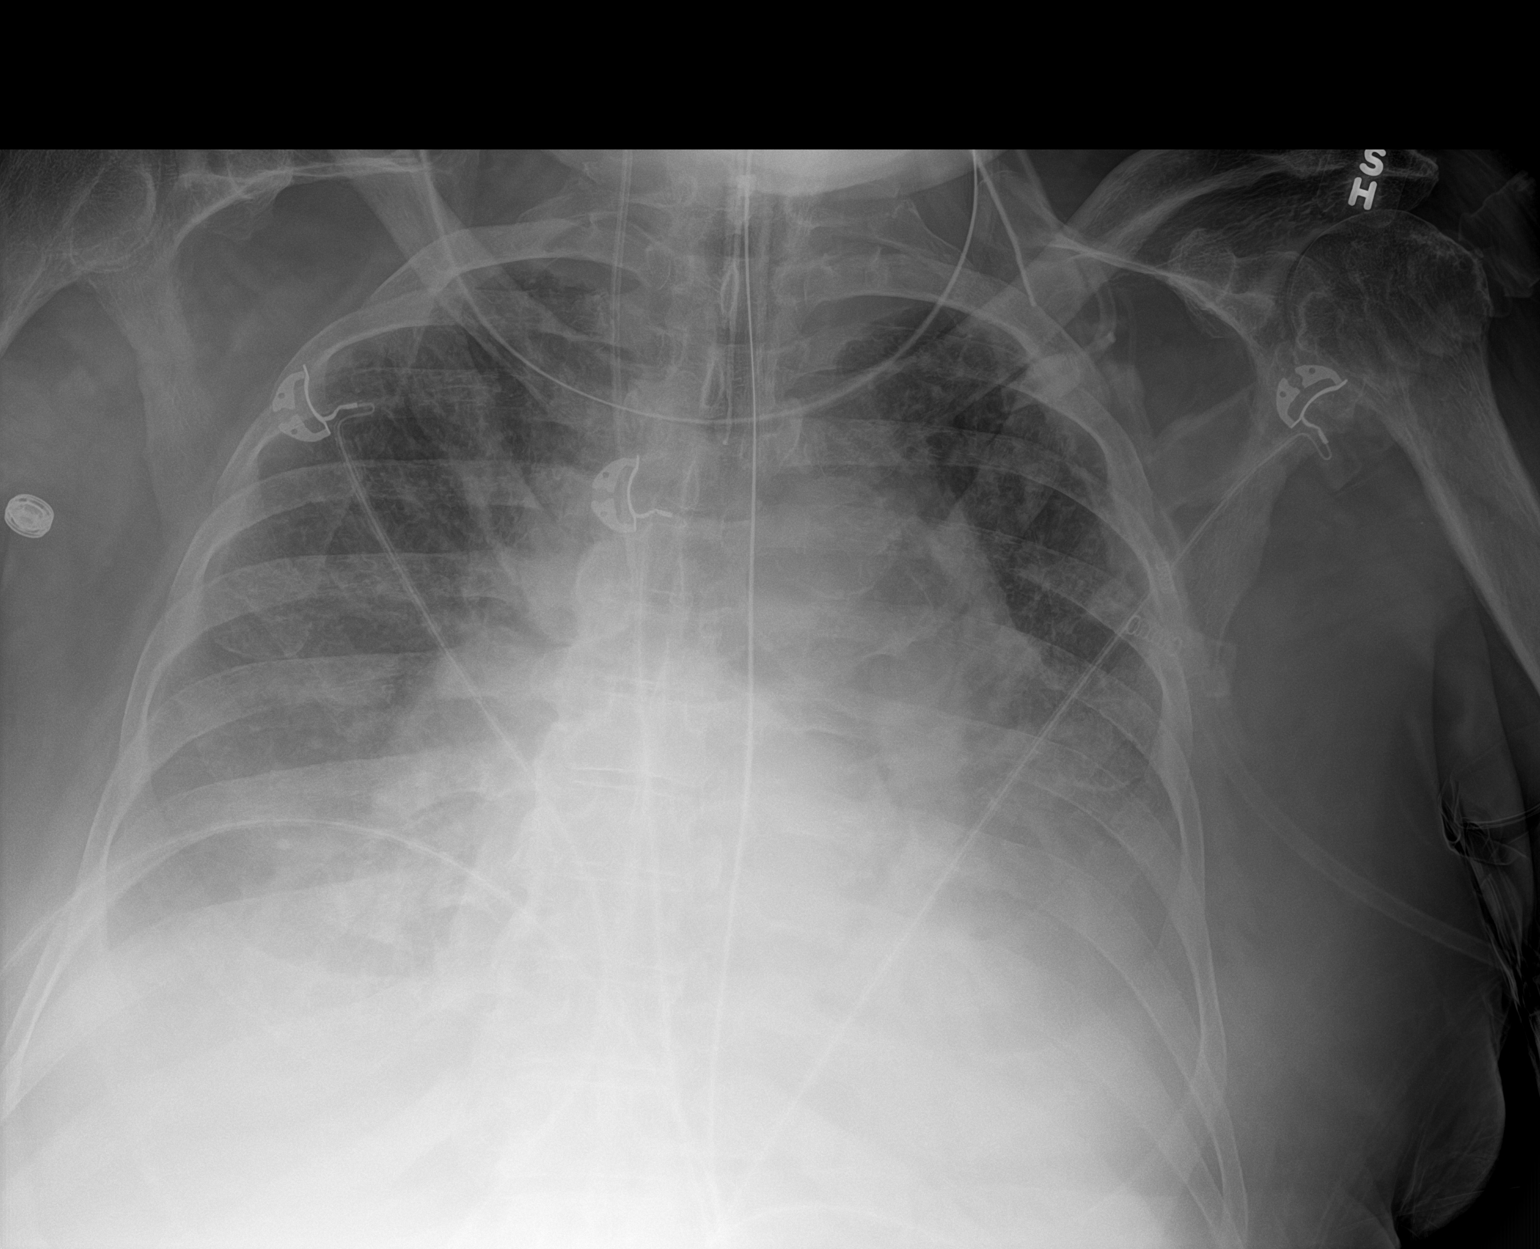

[1 of 1 positions shown; findings below may reference images not displayed]

FINDINGS: Endotracheal tube tip at the thoracic inlet. Enteric tube tip and
side-port below the diaphragm not included in the field of view.
Right internal jugular central line in the upper SVC. Cardiomegaly
is unchanged. Bilateral pulmonary opacities and pleural effusions,
not significantly changed yesterday. No visualized pneumothorax.
IMPRESSION: 1. Unchanged bilateral pulmonary opacities which may represent
pulmonary edema, pneumonia, or ARDS. Bilateral pleural effusions.
Unchanged cardiomegaly.
2. Stable support apparatus.

## 2021-01-27 MED ORDER — LATANOPROST 0.005 % OP SOLN
1.0000 [drp] | Freq: Every day | OPHTHALMIC | Status: DC
  Administered 2021-01-27 – 2021-01-30 (×3): 1 [drp] via OPHTHALMIC
  Filled 2021-01-27: qty 2.5

## 2021-01-27 MED ORDER — POLYVINYL ALCOHOL 1.4 % OP SOLN: 1.0000 [drp] | Freq: Two times a day (BID) | OPHTHALMIC | Status: DC | PRN

## 2021-01-27 MED ORDER — FLUTICASONE PROPIONATE 50 MCG/ACT NA SUSP
2.0000 | Freq: Every day | NASAL | Status: DC | PRN
  Administered 2021-01-27 – 2021-01-28 (×2): 2 via NASAL
  Filled 2021-01-27: qty 16

## 2021-01-27 MED ORDER — ACETAMINOPHEN 325 MG PO TABS
650.0000 mg | ORAL_TABLET | ORAL | Status: DC | PRN
  Filled 2021-01-27: qty 2

## 2021-01-27 MED ORDER — UMECLIDINIUM BROMIDE 62.5 MCG/INH IN AEPB
1.0000 | INHALATION_SPRAY | Freq: Every day | RESPIRATORY_TRACT | Status: DC
  Administered 2021-01-27 – 2021-02-03 (×8): 1 via RESPIRATORY_TRACT
  Filled 2021-01-27 (×2): qty 7

## 2021-01-27 MED ORDER — APIXABAN 5 MG PO TABS
5.0000 mg | ORAL_TABLET | Freq: Two times a day (BID) | ORAL | Status: DC
  Administered 2021-01-27 – 2021-02-03 (×15): 5 mg via ORAL
  Filled 2021-01-27 (×15): qty 1

## 2021-01-27 MED ORDER — SODIUM CHLORIDE 0.9% FLUSH
3.0000 mL | INTRAVENOUS | Status: DC | PRN
Start: 2021-01-27 — End: 2021-02-03

## 2021-01-27 MED ORDER — INSULIN DETEMIR 100 UNIT/ML ~~LOC~~ SOLN
12.0000 [IU] | Freq: Two times a day (BID) | SUBCUTANEOUS | Status: DC
  Administered 2021-01-27 – 2021-01-28 (×2): 12 [IU] via SUBCUTANEOUS
  Filled 2021-01-27 (×3): qty 0.12

## 2021-01-27 MED ORDER — METOLAZONE 5 MG PO TABS
5.0000 mg | ORAL_TABLET | Freq: Every day | ORAL | Status: DC
  Administered 2021-01-28 – 2021-02-02 (×6): 5 mg via ORAL
  Filled 2021-01-27: qty 1
  Filled 2021-01-27: qty 2
  Filled 2021-01-27 (×4): qty 1
  Filled 2021-01-27: qty 2

## 2021-01-27 MED ORDER — IPRATROPIUM-ALBUTEROL 0.5-2.5 (3) MG/3ML IN SOLN
3.0000 mL | Freq: Four times a day (QID) | RESPIRATORY_TRACT | Status: DC
  Administered 2021-01-27 – 2021-01-28 (×5): 3 mL via RESPIRATORY_TRACT
  Filled 2021-01-27 (×6): qty 3

## 2021-01-27 MED ORDER — ONDANSETRON HCL 4 MG/2ML IJ SOLN: 4.0000 mg | Freq: Four times a day (QID) | INTRAMUSCULAR | Status: DC | PRN

## 2021-01-27 MED ORDER — METOLAZONE 2.5 MG PO TABS
5.0000 mg | ORAL_TABLET | Freq: Two times a day (BID) | ORAL | Status: DC
  Administered 2021-01-27: 5 mg via ORAL
  Filled 2021-01-27: qty 2

## 2021-01-27 MED ORDER — SODIUM CHLORIDE 0.9% FLUSH
3.0000 mL | Freq: Two times a day (BID) | INTRAVENOUS | Status: DC
  Administered 2021-01-27 – 2021-02-03 (×11): 3 mL via INTRAVENOUS

## 2021-01-27 MED ORDER — FAMOTIDINE 20 MG PO TABS: 20.0000 mg | ORAL_TABLET | Freq: Two times a day (BID) | ORAL | Status: DC | PRN

## 2021-01-27 MED ORDER — OXYCODONE-ACETAMINOPHEN 5-325 MG PO TABS
1.0000 | ORAL_TABLET | Freq: Four times a day (QID) | ORAL | Status: DC | PRN
Start: 2021-01-27 — End: 2021-01-28
  Administered 2021-01-27 – 2021-01-28 (×5): 1 via ORAL
  Filled 2021-01-27 (×5): qty 1

## 2021-01-27 MED ORDER — OXYCODONE HCL 5 MG PO TABS
5.0000 mg | ORAL_TABLET | Freq: Four times a day (QID) | ORAL | Status: DC | PRN
  Administered 2021-01-27 – 2021-01-28 (×5): 5 mg via ORAL
  Filled 2021-01-27 (×5): qty 1

## 2021-01-27 MED ORDER — HEPARIN SODIUM (PORCINE) 5000 UNIT/ML IJ SOLN: 5000.0000 [IU] | Freq: Three times a day (TID) | INTRAMUSCULAR | Status: DC

## 2021-01-27 MED ORDER — IPRATROPIUM-ALBUTEROL 0.5-2.5 (3) MG/3ML IN SOLN: 3.0000 mL | RESPIRATORY_TRACT | Status: DC | PRN

## 2021-01-27 MED ORDER — BUDESONIDE 0.5 MG/2ML IN SUSP
0.5000 mg | Freq: Two times a day (BID) | RESPIRATORY_TRACT | Status: DC
  Administered 2021-01-27 – 2021-02-03 (×15): 0.5 mg via RESPIRATORY_TRACT
  Filled 2021-01-27 (×15): qty 2

## 2021-01-27 MED ORDER — FLUTICASONE FUROATE-VILANTEROL 100-25 MCG/INH IN AEPB
1.0000 | INHALATION_SPRAY | Freq: Every day | RESPIRATORY_TRACT | Status: DC
  Filled 2021-01-27 (×2): qty 28

## 2021-01-27 MED ORDER — INSULIN ASPART PROT & ASPART (70-30 MIX) 100 UNIT/ML ~~LOC~~ SUSP
10.0000 [IU] | Freq: Two times a day (BID) | SUBCUTANEOUS | Status: DC
  Administered 2021-01-27: 10 [IU] via SUBCUTANEOUS
  Filled 2021-01-27: qty 10

## 2021-01-27 MED ORDER — INSULIN ASPART 100 UNIT/ML ~~LOC~~ SOLN
0.0000 [IU] | Freq: Three times a day (TID) | SUBCUTANEOUS | Status: DC
  Administered 2021-01-27 (×2): 2 [IU] via SUBCUTANEOUS
  Administered 2021-01-28 (×2): 3 [IU] via SUBCUTANEOUS
  Administered 2021-01-28: 5 [IU] via SUBCUTANEOUS
  Administered 2021-01-29: 2 [IU] via SUBCUTANEOUS
  Administered 2021-01-29: 3 [IU] via SUBCUTANEOUS
  Administered 2021-01-29 – 2021-01-31 (×4): 2 [IU] via SUBCUTANEOUS
  Administered 2021-02-01: 3 [IU] via SUBCUTANEOUS
  Administered 2021-02-01 (×2): 2 [IU] via SUBCUTANEOUS

## 2021-01-27 MED ORDER — PANTOPRAZOLE SODIUM 40 MG PO TBEC
40.0000 mg | DELAYED_RELEASE_TABLET | Freq: Two times a day (BID) | ORAL | Status: DC
  Administered 2021-01-27 – 2021-02-03 (×15): 40 mg via ORAL
  Filled 2021-01-27 (×16): qty 1

## 2021-01-27 MED ORDER — METHYLPREDNISOLONE SODIUM SUCC 125 MG IJ SOLR
60.0000 mg | Freq: Two times a day (BID) | INTRAMUSCULAR | Status: DC
  Administered 2021-01-27 – 2021-01-28 (×3): 60 mg via INTRAVENOUS
  Filled 2021-01-27 (×5): qty 2

## 2021-01-27 MED ORDER — TAMSULOSIN HCL 0.4 MG PO CAPS
0.4000 mg | ORAL_CAPSULE | Freq: Every day | ORAL | Status: DC
  Administered 2021-01-27 – 2021-02-03 (×8): 0.4 mg via ORAL
  Filled 2021-01-27 (×8): qty 1

## 2021-01-27 MED ORDER — ATORVASTATIN CALCIUM 40 MG PO TABS
40.0000 mg | ORAL_TABLET | Freq: Every day | ORAL | Status: DC
  Administered 2021-01-27 – 2021-02-02 (×7): 40 mg via ORAL
  Filled 2021-01-27 (×8): qty 1

## 2021-01-27 MED ORDER — ARFORMOTEROL TARTRATE 15 MCG/2ML IN NEBU
15.0000 ug | INHALATION_SOLUTION | Freq: Two times a day (BID) | RESPIRATORY_TRACT | Status: DC
  Administered 2021-01-27 – 2021-02-03 (×15): 15 ug via RESPIRATORY_TRACT
  Filled 2021-01-27 (×15): qty 2

## 2021-01-27 MED ORDER — BUDESON-GLYCOPYRROL-FORMOTEROL 160-9-4.8 MCG/ACT IN AERO: 2.0000 | INHALATION_SPRAY | Freq: Two times a day (BID) | RESPIRATORY_TRACT | Status: DC

## 2021-01-27 MED ORDER — METOLAZONE 2.5 MG PO TABS: 5.0000 mg | ORAL_TABLET | Freq: Once | ORAL | Status: DC

## 2021-01-27 MED ORDER — INSULIN ASPART 100 UNIT/ML ~~LOC~~ SOLN: 0.0000 [IU] | Freq: Every day | SUBCUTANEOUS | Status: DC

## 2021-01-27 MED ORDER — FUROSEMIDE 10 MG/ML IJ SOLN
80.0000 mg | Freq: Two times a day (BID) | INTRAMUSCULAR | Status: DC
  Administered 2021-01-27 (×2): 80 mg via INTRAVENOUS
  Filled 2021-01-27 (×2): qty 8

## 2021-01-27 MED ORDER — AMIODARONE HCL 200 MG PO TABS
200.0000 mg | ORAL_TABLET | Freq: Every day | ORAL | Status: DC
  Administered 2021-01-27 – 2021-01-31 (×5): 200 mg via ORAL
  Filled 2021-01-27 (×5): qty 1

## 2021-01-27 MED ORDER — INSULIN ASPART 100 UNIT/ML ~~LOC~~ SOLN: 0.0000 [IU] | Freq: Three times a day (TID) | SUBCUTANEOUS | Status: DC

## 2021-01-27 MED ORDER — INSULIN ASPART 100 UNIT/ML ~~LOC~~ SOLN
3.0000 [IU] | Freq: Three times a day (TID) | SUBCUTANEOUS | Status: DC
  Administered 2021-01-27 – 2021-01-28 (×4): 3 [IU] via SUBCUTANEOUS

## 2021-01-27 MED ORDER — GLUCERNA SHAKE PO LIQD
237.0000 mL | Freq: Three times a day (TID) | ORAL | Status: DC
  Administered 2021-01-27 – 2021-02-03 (×9): 237 mL via ORAL
  Filled 2021-01-27 (×2): qty 237

## 2021-01-27 MED ORDER — OXYCODONE-ACETAMINOPHEN 10-325 MG PO TABS
1.0000 | ORAL_TABLET | Freq: Four times a day (QID) | ORAL | Status: DC | PRN
Start: 2021-01-27 — End: 2021-01-27

## 2021-01-27 MED ORDER — POTASSIUM CHLORIDE CRYS ER 20 MEQ PO TBCR
40.0000 meq | EXTENDED_RELEASE_TABLET | ORAL | Status: AC
  Administered 2021-01-27 (×2): 40 meq via ORAL
  Filled 2021-01-27 (×2): qty 2

## 2021-01-27 MED ORDER — SODIUM CHLORIDE 0.9 % IV SOLN: 250.0000 mL | INTRAVENOUS | Status: DC | PRN

## 2021-01-27 NOTE — H&P (Signed)
History and Physical    Nicholas Caldwell QMG:[id number] DOB: 1945-12-25 DOA: 01/26/2021  PCP: [name], MD  Patient coming from: Home   Chief Complaint:  Chief Complaint  Patient presents with  . Shortness of Breath     HPI:    75 year old male with past medical history of systolic and diastolic congestive heart failure (last echo 12/2020 EF 45-50%), paroxysmal atrial fibrillation (S/P DCCV 12/2020 and ablation 01/10/2021), obstructive sleep apnea on CPAP, chronic respiratory failure (on 3lpm via [place] continuous), COPD, coronary artery disease (nonobstructive via cath 12/2018), insulin-dependent diabetes mellitus type 2, pulmonary hypertension, chronic pain syndrome, benign prostatic hyperplasia, hyperlipidemia who presents to [facility] emergency department with complaints of shortness of breath.  Of note, patient was recently hospitalized at [facility] from 3/5 until 3/10.  Patient was managed for acute congestive heart failure with the assistance of the heart failure consultation team with intravenous diuretics.  During this hospital stay, patient was also noted to have progressive bilateral lower extremity weakness and difficulty with ambulation.  Patient was evaluated by neurology and a work-up was initiated with the intent for the patient to follow-up as an outpatient for continued work-up.  Patient was eventually discharged home on 3/10.  Patient explains that when he was discharged home 2-1/2 weeks ago he felt that he was not yet at baseline.  Patient reports that he felt that he still had a significant amount of fluid on him.  In the days following his discharge back home, once again began to experience progressively worsening shortness of breath.  Patient describes the shortness of breath is severe in intensity, worse with minimal exertion without any alleviating factors.  Patient is complaining of associated progressive generalized weakness.  Patient is also complaining  of associated paroxysmal nocturnal dyspnea and pillow orthopnea.  Additionally, patient is also complaining of ongoing cough that is nonproductive, bilateral lower extremity pitting edema, increasing weight gain and increasing abdominal girth.  Patient reports that he is compliant with all medications.  Patient denies recent travel,  patient denies any sick contacts with confirmed COVID-19.  Due to progressively worsening weakness patient eventually presented to [facility] emergency department via EMS.  Upon evaluation in the emergency department, patient was found to be short of breath and hypoxic requiring increased oxygen supplementation.  Clinically the patient was found to be suffering from acute cardiogenic volume overload once again.  Patient was found to have acute kidney injury superimposed on chronic kidney disease with a creatinine of 3.5.  Patient was administered 80 mg of intravenous Lasix and the hospitalist group was then called to assist the patient admission the hospital.  Review of Systems:   Review of Systems  Constitutional: Positive for malaise/fatigue.  Respiratory: Positive for cough, shortness of breath and wheezing.   Musculoskeletal: Positive for myalgias.  Neurological: Positive for weakness.  All other systems reviewed and are negative.   Past Medical History:  Diagnosis Date  . Atrial flutter ([place])    a. recurrent AFlutter with RVR;  b. Amiodarone Rx started 4/16  . CAD (coronary artery disease)    a. LHC 1/16:  mLAD diffuse disease, pLCx mild disease, dLCx with disease but too small for PCI, RCA ok, EF 25-30%  . Chronic pain   . Chronic systolic CHF (congestive heart failure) ([place])   . COPD (chronic obstructive pulmonary disease) ([place])   . Diabetes mellitus without complication ([place])   . Ex-smoker 11/2017  . Hypercholesteremia   . Hypertension   .  NICM (nonischemic cardiomyopathy) ([place])    a.dx 2016. b. 2D echo 06/2016 - Last echo 07/01/16: mod  dilated LV, mod LVH, EF 25-30%, mild-mod MR, sev LAE, mild-mod reduced RV systolic function, mild-mod TR, PASP 48mHG.  .Marland KitchenPAF (paroxysmal atrial fibrillation) (HCC)    On amio - ot a candidate for flecainide due to cardiomyopathy, not a candidate for Tikosyn due to prolonged QT, and felt to be a poor candidate for ablation given left atrial size.  . Pulmonary hypertension ([place]   . Sleep apnea   . Tobacco abuse     Past Surgical History:  Procedure Laterality Date  . BIOPSY  03/11/2019   Procedure: BIOPSY;  Surgeon: CLavena Bullion DO;  [place]  Service: Gastroenterology;;  . BIOPSY  03/15/2019   Procedure: BIOPSY;  Surgeon: MIrving Copas, MD;  [place]  Service: Gastroenterology;;  . CARDIOVERSION N/A 07/30/2018   Procedure: CARDIOVERSION;  Surgeon: MLarey Dresser MD;  [facility];  Service: Cardiovascular;  Laterality: N/A;  . CARDIOVERSION N/A 12/17/2020   Procedure: CARDIOVERSION;  Surgeon: MLarey Dresser MD;  [facility];  Service: Cardiovascular;  Laterality: N/A;  . CARDIOVERSION N/A 01/10/2021   Procedure: CARDIOVERSION;  Surgeon: MLarey Dresser MD;  [facility];  Service: Cardiovascular;  Laterality: N/A;  . COLONOSCOPY N/A 03/11/2019   Procedure: COLONOSCOPY;  Surgeon: CLavena Bullion DO;  [place]  Service: Gastroenterology;  Laterality: N/A;  . ENTEROSCOPY N/A 03/15/2019   Procedure: ENTEROSCOPY;  Surgeon: MRush LandmarkGTelford Nab, MD;  [place]  Service: Gastroenterology;  Laterality: N/A;  . ESOPHAGOGASTRODUODENOSCOPY Left 03/11/2019   Procedure: ESOPHAGOGASTRODUODENOSCOPY (EGD);  Surgeon: CLavena Bullion DO;  [facility];  Service: Gastroenterology;  Laterality: Left;  . LEFT AND RIGHT HEART CATHETERIZATION WITH CORONARY ANGIOGRAM N/A 11/06/2014   Procedure: LEFT AND RIGHT HEART CATHETERIZATION WITH CORONARY ANGIOGRAM;  Surgeon: JJettie Booze MD;   [facility];  Service: Cardiovascular;  Laterality: N/A;  . RIGHT/LEFT HEART CATH AND CORONARY ANGIOGRAPHY N/A 12/06/2018   Procedure: RIGHT/LEFT HEART CATH AND CORONARY ANGIOGRAPHY;  Surgeon: MLarey Dresser MD;  [place];  Service: Cardiovascular;  Laterality: N/A;  . SPLENECTOMY    . SUBMUCOSAL TATTOO INJECTION  03/15/2019   Procedure: SUBMUCOSAL TATTOO INJECTION;  Surgeon: MIrving Copas, MD;  [place] [facility]  Service: Gastroenterology;;  . TEE WITHOUT CARDIOVERSION N/A 12/17/2020   Procedure: TRANSESOPHAGEAL ECHOCARDIOGRAM (TEE);  Surgeon: MLarey Dresser MD;  [facility];  Service: Cardiovascular;  Laterality: N/A;     reports that he quit smoking about 2 years ago. His smoking use included cigarettes. He has a 34.00 pack-year smoking history. He has never used smokeless tobacco. He reports that he does not drink alcohol and does not use drugs.  Allergies  Allergen Reactions  . Isosorb Dinitrate-Hydralazine Shortness Of Breath  . Benazepril Nausea And Vomiting  . Brimonidine Other (See Comments)    Dry eyes  . Metformin Other (See Comments)    Cramps and abdominal pain  . Morphine Rash    Family History  Problem Relation Age of Onset  . Heart disease Mother   . Hypertension Mother   . Heart failure Mother   . Heart disease Father   . Hypertension Sister   . Heart attack Neg Hx   . Stroke Neg Hx   . Colon cancer Neg Hx   . Esophageal cancer Neg Hx  Prior to Admission medications   Medication Sig Start Date End Date Taking? Authorizing Provider  acetaminophen (TYLENOL) 325 MG tablet Take 2 tablets (650 mg total) by mouth every 4 (four) hours as needed for headache or mild pain. 10/09/20   Geradine Girt, DO  albuterol (VENTOLIN HFA) 108 (90 Base) MCG/ACT inhaler Inhale 2 puffs into the lungs every 6 (six) hours as needed for wheezing or shortness of breath.    [provider]  amiodarone (PACERONE) 200  MG tablet Take 1 tablet (200 mg total) by mouth daily. 01/11/21   Nita Sells, MD  apixaban (ELIQUIS) 5 MG TABS tablet Take 1 tablet (5 mg total) by mouth 2 (two) times daily. 12/19/20 01/18/21  Darliss Cheney, MD  atorvastatin (LIPITOR) 40 MG tablet Take 1 tablet (40 mg total) by mouth daily. Patient taking differently: Take 40 mg by mouth at bedtime. 10/30/20   Larey Dresser, MD  Budeson-Glycopyrrol-Formoterol (BREZTRI AEROSPHERE) 160-9-4.8 MCG/ACT AERO Inhale 2 puffs into the lungs 2 (two) times daily. 06/21/20   Tanda Rockers, MD  carbamide peroxide (DEBROX) 6.5 % OTIC solution Place 5-10 drops into both ears 3 (three) times daily as needed (ear wax).    [provider]  Carboxymethylcellulose Sod PF 0.25 % SOLN Place 1 drop into both eyes 2 (two) times daily as needed (dry eyes).    [provider]  Cholecalciferol (VITAMIN D) 50 MCG (2000 UT) CAPS Take 2,000 Units by mouth daily.    [provider]  famotidine (PEPCID) 20 MG tablet TAKE 1 TABLET(20 MG) BY MOUTH TWICE DAILY Patient taking differently: Take 20 mg by mouth 2 (two) times daily as needed for indigestion. 10/29/20   Larey Dresser, MD  feeding supplement, GLUCERNA SHAKE, (GLUCERNA SHAKE) LIQD Take 237 mLs by mouth 3 (three) times daily between meals. 11/27/20   Swayze, Ava, DO  ferrous sulfate 325 (65 FE) MG tablet Take 325 mg by mouth every Monday, Wednesday, and Friday at 6 PM.    [provider]  fluticasone (FLONASE) 50 MCG/ACT nasal spray Place 2 sprays into both nostrils daily as needed for allergies.    [provider]  hydrocortisone (ANUSOL-HC) 2.5 % rectal cream Place 1 application rectally 2 (two) times daily as needed for hemorrhoids or itching.     [provider]  insulin aspart protamine- aspart (NOVOLOG MIX 70/30) (70-30) 100 UNIT/ML injection Inject 10 Units into the skin 2 (two) times daily with a meal.    [provider]  Insulin Syringe 27G X  1/2" 0.5 ML MISC 100 Syringes by Does not apply route 3 (three) times daily. 08/29/19   Hosie Poisson, MD  ipratropium-albuterol (DUONEB) 0.5-2.5 (3) MG/3ML SOLN Take 3 mLs by nebulization 4 (four) times daily as needed.    [provider]  latanoprost (XALATAN) 0.005 % ophthalmic solution Place 1 drop into both eyes at bedtime.    [provider]  lidocaine (LIDODERM) 5 % Place 1 patch onto the skin daily. Remove & Discard patch within 12 hours or as directed by MD 10/10/20   Geradine Girt, DO  Nystatin (GERHARDT'S BUTT CREAM) CREA Apply 1 application topically 2 (two) times daily. 01/10/21   Nita Sells, MD  oxybutynin (DITROPAN) 5 MG tablet Take 1 tablet by mouth 4 (four) times daily as needed for incontinence. 07/03/20   [provider]  oxyCODONE-acetaminophen (PERCOCET) 10-325 MG tablet Take 1 tablet by mouth every 6 (six) hours as needed for pain.  [provider]  OXYGEN Inhale 3 L/min into the lungs continuous.    [provider]  pantoprazole (PROTONIX) 40 MG tablet Take 40 mg by mouth 2 (two) times daily. 07/03/20   [provider]  polyethylene glycol powder (GLYCOLAX/MIRALAX) 17 GM/SCOOP powder Take 17 g by mouth daily as needed for constipation. 07/03/20   [provider]  potassium chloride SA (KLOR-CON) 20 MEQ tablet Take 1 tablet (20 mEq total) by mouth 2 (two) times daily. 01/10/21   Nita Sells, MD  senna-docusate (SENOKOT-S) 8.6-50 MG tablet Take 1 tablet by mouth at bedtime as needed for mild constipation. 07/03/20   [provider]  tamsulosin (FLOMAX) 0.4 MG CAPS capsule Take 1 capsule (0.4 mg total) by mouth daily. Patient taking differently: Take 0.4 mg by mouth daily after breakfast. 10/09/20   Geradine Girt, DO  terbinafine (LAMISIL) 1 % cream Apply 1 application topically daily. 03/10/20   [provider]  torsemide 40 MG TABS Take 80 mg by mouth 2 (two) times daily. 01/10/21    Nita Sells, MD  umeclidinium bromide (INCRUSE ELLIPTA) 62.5 MCG/INH AEPB Inhale 1 puff into the lungs daily. Patient not taking: Reported on 01/11/2021 11/28/20   Karie Kirks, DO    Physical Exam: Vitals:   01/27/21 0015 01/27/21 0029 01/27/21 0058 01/27/21 0100  BP: (!) 117/56  112/73 114/62  Pulse: 68   71  Resp: _0 Temp:  98.5 F (36.9 C) 99.1 F (37.3 C)   TempSrc:  Oral Oral   SpO2: 97%  95% 99%  Weight:    109.5 kg  Height:    6' (1.829 m)    Constitutional: Awake alert and oriented x3, patient is in respiratory distress. Skin: Small stage II left buttock pressure wound noted, present on admission.   good skin turgor noted. Eyes: Pupils are equally reactive to light.  No evidence of scleral icterus or conjunctival pallor.  ENMT: Moist mucous membranes noted.  Posterior pharynx clear of any exudate or lesions.   Neck: normal, supple, no masses, no thyromegaly.  Slight elevation jugular venous pulse. Respiratory: Coarse breath sounds with rales noted in the bilateral mid and lower fields.  Associated expiratory wheezing noted in all fields with prolonged expiratory phase.  Some increased respiratory effort without accessory muscle use.  Cardiovascular: Regular rate and rhythm, no murmurs / rubs / gallops.  Extensive bilateral lower extremity pitting edema that tracks up to the thighs. 2+ pedal pulses. No carotid bruits.  Chest:   Nontender without crepitus or deformity.   Back:   Nontender without crepitus or deformity. Abdomen: Abdomen is protuberant but soft and nontender.  No evidence of intra-abdominal masses.  Positive bowel sounds noted in all quadrants.   Musculoskeletal: No joint deformity upper and lower extremities. Good ROM, no contractures. Normal muscle tone.  Neurologic: CN 2-12 grossly intact. Sensation intact.  Patient moving all 4 extremities spontaneously.  Patient is following all commands.  Patient is responsive to verbal stimuli.   Psychiatric:  Patient exhibits depressed mood with flat affect.  Patient seems to possess insight as to their current situation.     Labs on Admission: I have personally reviewed following labs and imaging studies -   CBC: Recent Labs  Lab 01/26/21 2142  WBC 5.8  NEUTROABS 4.3  HGB 8.9*  HCT 30.1*  MCV 87.2  PLT 594*   Basic Metabolic Panel: Recent Labs  Lab 01/26/21 2142 01/26/21 2319  NA 132* 136  K 6.2* 3.4*  CL 93* 95*  CO2 30 31  GLUCOSE 199* 139*  BUN 72* 70*  CREATININE 3.50* 3.42*  CALCIUM 8.3* 8.5*   GFR: Estimated Creatinine Clearance: 25 mL/min (A) (by C-G formula based on SCr of 3.42 mg/dL (H)). Liver Function Tests: Recent Labs  Lab 01/26/21 2142  AST 48*  ALT 18  ALKPHOS 104  BILITOT 1.2  PROT 6.9  ALBUMIN 2.8*   No results for input(s): LIPASE, AMYLASE in the last 168 hours. No results for input(s): AMMONIA in the last 168 hours. Coagulation Profile: No results for input(s): INR, PROTIME in the last 168 hours. Cardiac Enzymes: No results for input(s): CKTOTAL, CKMB, CKMBINDEX, TROPONINI in the last 168 hours. BNP (last 3 results) No results for input(s): PROBNP in the last 8760 hours. HbA1C: No results for input(s): HGBA1C in the last 72 hours. CBG: No results for input(s): GLUCAP in the last 168 hours. Lipid Profile: No results for input(s): CHOL, HDL, LDLCALC, TRIG, CHOLHDL, LDLDIRECT in the last 72 hours. Thyroid Function Tests: No results for input(s): TSH, T4TOTAL, FREET4, T3FREE, THYROIDAB in the last 72 hours. Anemia Panel: No results for input(s): VITAMINB12, FOLATE, FERRITIN, TIBC, IRON, RETICCTPCT in the last 72 hours. Urine analysis:    Component Value Date/Time   COLORURINE YELLOW 12/11/2020 1107   APPEARANCEUR HAZY (A) 12/11/2020 1107   LABSPEC 1.020 12/11/2020 1107   PHURINE 5.5 12/11/2020 1107   GLUCOSEU NEGATIVE 12/11/2020 1107   HGBUR NEGATIVE 12/11/2020 1107   BILIRUBINUR NEGATIVE 12/11/2020 1107   KETONESUR NEGATIVE 12/11/2020  1107   PROTEINUR NEGATIVE 12/11/2020 1107   UROBILINOGEN 0.2 10/26/2014 2206   NITRITE NEGATIVE 12/11/2020 1107   LEUKOCYTESUR NEGATIVE 12/11/2020 1107    Radiological Exams on Admission - Personally Reviewed: DG Chest Portable 1 View  Result Date: 01/26/2021 CLINICAL DATA:  Shortness of breath. EXAM: PORTABLE CHEST 1 VIEW COMPARISON:  Most recent radiograph 01/05/2021. Chest CT 08/09/2019 reviewed FINDINGS: Chronic cardiomegaly. Progressive pulmonary edema. Retrocardiac opacity likely combination of pleural effusion and atelectasis/airspace disease. Increased right pleural effusion from prior exam. No pneumothorax. IMPRESSION: 1. Progressive pulmonary edema and right pleural effusion from prior exam. 2. Retrocardiac opacity likely combination of pleural fluid and atelectasis/airspace disease. 3. Chronic cardiomegaly. Electronically Signed   By: Keith Rake M.D.   On: 01/26/2021 21:33    EKG: Personally reviewed.  Rhythm is normal sinus rhythm with heart rate of 78 bpm.  Notable intraventricular conduction delay.  No dynamic ST segment changes appreciated.  Assessment/Plan Principal Problem:   Acute on chronic combined systolic and diastolic CHF (congestive heart failure) ([place])   Patient presenting with approximately 2-week history of progressive worsening shortness of breath and increasing pedal edema with weight gain, increasing abdominal girth, paroxysmal nocturnal dyspnea and pillow orthopnea concerning for recurrent cardiogenic volume overload   Chest x-ray revealing extensive bilateral infiltrates concerning for acute cardiogenic pulmonary edema   Patient is already on high-dose diuretic regimen at home with 80 mg of torsemide twice daily  We will place patient on Lasix 80 mg twice daily and add metolazone 5 mg daily  Strict input and output monitoring  Monitoring renal function and electrolytes with serial chemistries  Finally, will rule out alternative causes of bilateral  infiltrates with COVID-19 PCR testing, CRP and procalcitonin testing   Active Problems:   COPD with acute exacerbation (HCC)   Significant wheezing on examination concerning for concurrent COPD exacerbation  Placing patient on aggressive bronchodilator therapy  If this alone does not  result in improvement in wheezing will consider initiation of systemic steroids    Lactic acidosis   Mild lactic acidosis on arrival likely secondary to transient hypoxia  Treating patient with submental oxygen and active intravenous diuretics  Performing serial lactic acid levels to ensure downtrending and resolution    Acute on chronic respiratory failure with hypoxia (HCC)   Patient initially presenting with hypoxia requiring titration of supplemental oxygen to achieve oxygen saturations of 89 to 92%  Hypoxia secondary to acute cardiogenic volume overload with pulmonary edema and concurrent COPD exacerbation  Treating underlying conditions, titrate submental oxygen as necessary.  Patient is typically on 3 L of oxygen at all times at home.    Acute renal failure superimposed on stage 3b chronic kidney disease ([place])   Progressively worsening renal function, clinically concerning for cardiorenal syndrome  Actively attempting to diurese the patient with a combination of Lasix and metolazone in the hopes that renal function will paradoxically improve  Will consider formally consulting nephrology in the morning  Strict input and output monitoring  Monitoring renal function and electrolytes with serial chemistries  Obtaining renal ultrasound, urine electrolytes    PAF (paroxysmal atrial fibrillation) (HCC)   Complicated history of paroxysmal atrial fibrillation status post DCCV in February and ablation on 3/10  Patient is currently in sinus rhythm and rate controlled  Continue home regimen of amiodarone  Continue home regimen of Eliquis  Monitoring patient on telemetry    Coronary  artery disease involving native coronary artery of native heart  . Patient is currently chest pain free . Monitoring patient on telemetry . Continue home regimen of anticoagulation and lipid-lowering therapy    OSA (obstructive sleep apnea)   CPAP nightly    Mixed diabetic hyperlipidemia associated with type 2 diabetes mellitus ([place])   Continue home regimen of statin therapy    Pressure ulcer of left buttock   Small left buttock pressure ulcer noted on admission exam  Wound care evaluation ordered  Dressing wound with Mepilex for now    Uncontrolled type 2 diabetes mellitus with hyperglycemia, with long-term current use of insulin ([place])  . Patient been placed on Accu-Cheks before every meal and nightly with sliding scale insulin . Hemoglobin A1C found to be 6.8% on 2/8. Marland Kitchen Diabetic Diet . Continue home regimen of insulin therapy    Leg weakness, bilateral   Approximately 67-monthhistory of reported progressive difficulty with ambulation according to the patient.  Patient is now essentially bed and wheelchair bound due to progressive weakness  On my physical exam however patient seems to exhibit good strength and sensation is intact  Patient was evaluated by Dr. [place] neurology during last hospitalization who initiated a work-up for concerns about progressive neuropathy with the instruction that patient should follow-up as an outpatient with neurology for continued work-up.  Patient states that he never received a follow-up appointment.   Code Status:  Full code Family Communication: deferred   Status is: Observation  The patient remains OBS appropriate and will d/c before 2 midnights.  Dispo: The patient is from: Home              Anticipated d/c is to: Home              Patient currently is not medically stable to d/c.   Difficult to place patient No        GVernelle EmeraldMD [facility] Pager [phone] If 7PM-7AM, please contact  night-coverage [email or web address] Use  universal [place] password for that web site. If you do not have the password, please call the hospital operator.  01/27/2021, 3:00 AM

## 2021-01-27 NOTE — Progress Notes (Signed)
PT Cancellation Note  Patient Details Name: Nicholas Caldwell MRN: [id number] DOB: Mar 03, 1946   Cancelled Treatment:    Reason Eval/Treat Not Completed: Patient declined, no reason specified. Pt refuses PT at this time, reporting discomfort in hip. PT attempts to encourage the patient to participate in mobility and provides education on the benefits of mobility despite discomfort, pt not accepting of this education. PT will attempt to follow up as time allows.   Zenaida Niece 01/27/2021, 12:41 PM

## 2021-01-27 NOTE — Progress Notes (Addendum)
OT Cancellation Note  Patient Details Name: Nicholas Caldwell MRN: [id number] DOB: 30-Aug-1946   Cancelled Treatment:    Reason Eval/Treat Not Completed: Patient declined, no reason specified (Pt with c/o BLE pain and refusing OT eval at this time. OT attempting to educate pt on the benefits of movement, but pt refusing due to pain and not feeling well. [place] of BLEs just performed. OT to continue to follow.)   Jefferey Pica, OTR/L Acute Rehabilitation Services Pager: [phone] Office: [phone]   Jefferey Pica 01/27/2021, 12:32 PM

## 2021-01-27 NOTE — Progress Notes (Signed)
Venous duplex lower ext.  has been completed. Refer to [facility] under chart review to view preliminary results.   01/27/2021  12:29 PM Phat Dalton, Bonnye Fava

## 2021-01-27 NOTE — Progress Notes (Signed)
PROGRESS NOTE  Nicholas Caldwell ZLD:[id number] DOB: May 05, 1946   PCP: [name], MD  Patient is from: Home.  Lives with his wife.  Uses cane, walker and wheelchair at baseline.  DOA: 01/26/2021 LOS: 0  Chief complaints: Shortness of breath and generalized weakness  Brief Narrative / Interim history: 75 year old M with PMH of combined CHF, COPD, OSA on CPAP, chronic hypoxemic RF on 3 L, PAH, PAF, IDDM-2, CKD-3B chronic pain syndrome, HTN, BPH, HLD and debility presenting with shortness of breath, generalized weakness, PND, orthopnea, edema and increased abdominal girth and admitted for acute on chronic combined CHF, possible COPD exacerbation and AKI on CKD-3B.  Has elevated BNP higher than baseline and CXR concerning for CHF.  Started on IV Lasix.   Subjective: Seen and examined earlier this morning.  No major events overnight of this morning.  Continues to complain chest tightness and shortness of breath.  Also reports dry cough and leg swelling.  Denies GI or UTI symptoms.  Objective: Vitals:   01/27/21 0809 01/27/21 0823 01/27/21 0825 01/27/21 0951  BP: (!) 100/51   108/62  Pulse: 71   67  Resp: 16   17  Temp: 98.6 F (37 C)     TempSrc: Oral     SpO2: 96% 97% 94% 94%  Weight:      Height:        Intake/Output Summary (Last 24 hours) at 01/27/2021 1003 Last data filed at 01/27/2021 0824 Gross per 24 hour  Intake 600 ml  Output 1000 ml  Net -400 ml   Filed Weights   01/26/21 2105 01/27/21 0100  Weight: 110.2 kg 109.5 kg    Examination:  GENERAL: No apparent distress.  Nontoxic. HEENT: MMM.  Vision and hearing grossly intact.  NECK: Supple.  No apparent JVD but difficult exam.  RESP: 96% on 6 L at rest.  No IWOB.  Fair aeration.  Rhonchi bilaterally. CVS:  RRR. Heart sounds normal.  ABD/GI/GU: BS+. Abd soft, NTND.  MSK/EXT:  Moves extremities. No apparent deformity.  1+ pitting edema in RLE.  Trace edema in LLE. SKIN: no apparent skin lesion or wound NEURO:  Awake, alert and oriented appropriately.  No apparent focal neuro deficit. PSYCH: Calm. Normal affect.   Procedures:  None  Microbiology summarized: [id number] and influenza PCR nonreactive.  Assessment & Plan: Acute on chronic combined CHF/BiV HF: Presents with cardinal symptoms.  Reports good compliance with medication.  BNP 3600 (higher than baseline).  CXR with progressive pulmonary edema and right pleural effusion from prior exam.  Started on IV Lasix.  Renal function improved. I&O is incomplete.  TEE in 12/2020 with LVEF of 45 to 50%, global hypokinesis, severely reduced RVSF and severely enlarged RV.  -Continue IV Lasix at 80 mg twice daily -Reduce metolazone to 5 mg daily -Monitor fluid status, renal functions and electrolytes -Sodium and fluid restrictions.  COPD with acute exacerbation ([place]): Reportedly had wheezing.  Complains chest tightness, cough and shortness of breath. -Start Solu-Medrol 60 mg twice daily -Brovana, Incruse Ellipta and as needed DuoNeb -Doubt need for antibiotic without productive cough  Chronic hypoxemic respiratory failure: Patient is on 3 L at baseline.  No documented desaturation but has been on 6 L by [place]. -Wean oxygen as able -Treat treatable causes  RLE edema/swelling: Could be due to CHF. -LUE ultrasound to exclude DVT  Elevated troponin: Likely demand ischemia in the setting of CHF and delayed clearance in the setting of AKI.  No significant delta. -Manage CHF and  AKI accordingly.  Lactic acidosis: Resolved.  Lactic acidosis: Due to hypoxia?  Hyperkalemia: Resolved.  Hypokalemia: K3.4.  Mg 2.1. -Replenish and recheck.  AKI on CKD-3B/azotemia: Baseline Cr 2.0-2.2.  Improving with diuretics.  Cardiorenal?  Renal ultrasound without significant finding. Recent Labs    12/18/20 0128 12/19/20 0158 01/05/21 2213 01/07/21 0057 01/08/21 0032 01/09/21 0119 01/10/21 0032 01/26/21 2142 01/26/21 2319 01/27/21 0315  BUN 41* 37* 49* 47*  41* 41* 40* 72* 70* 70*  CREATININE 2.20* 2.14* 2.16* 2.11* 1.92* 1.91* 2.11* 3.50* 3.42* 3.36*  -Continue monitoring -Monitor urine output  Paroxysmal A. fib: Rate controlled.  Post DCCV on 3/10. -Continue home amiodarone and Eliquis -Optimize K and MG  History of nonobstructive CAD: Reports chest tightness likely from CHF and possible COPD exacerbation.  Mild troponin elevation to 40s but no significant delta suggesting demand ischemia. -Medical management   OSA (obstructive sleep apnea) on CPAP -Nightly CPAP.  IDDM-2 with hyperlipidemia: A1c 6.8% on 2/80/22. Recent Labs  Lab 01/27/21 0628  GLUCAP 105*  -Check hemoglobin A1c -Continue SSI-moderate. -Add mealtime coverage at 3 units -Change 70/30 to Levemir 12 units twice daily starting tonight -Further adjustment as appropriate  Debility/physical deconditioning: Uses cane, walker and wheelchair at baseline.  Reports progressive lower extremity weakness for about 6 months.  Previously evaluated by neurology who recommended outpatient follow-up but never happened -PT/OT -Ordered ambulatory referral to neurology on discharge   Class I obesity Body mass index is 32.74 kg/m.        Pressure skin injury: POA Pressure Injury 01/27/21 Buttocks Left;Mid Stage 2 -  Partial thickness loss of dermis presenting as a shallow open injury with a red, pink wound bed without slough. pink 0.5 cm x 0.5 cm (Active)  01/27/21 0100  Location: Buttocks  Location Orientation: Left;Mid  Staging: Stage 2 -  Partial thickness loss of dermis presenting as a shallow open injury with a red, pink wound bed without slough.  Wound Description (Comments): pink 0.5 cm x 0.5 cm  Present on Admission: Yes   DVT prophylaxis:   apixaban (ELIQUIS) tablet 5 mg  Code Status: Full code Family Communication: Patient and/or RN. Available if any question.  Level of care: Progressive Status is: Observation  The patient will require care spanning > 2  midnights and should be moved to inpatient because: Persistent severe electrolyte disturbances, Ongoing diagnostic testing needed not appropriate for outpatient work up, IV treatments appropriate due to intensity of illness or inability to take PO and Inpatient level of care appropriate due to severity of illness  Dispo: The patient is from: Home              Anticipated d/c is to: Home              Patient currently is not medically stable to d/c.   Difficult to place patient No       Consultants:  None   Sch Meds:  Scheduled Meds: . amiodarone  200 mg Oral Daily  . apixaban  5 mg Oral BID  . arformoterol  15 mcg Nebulization BID  . atorvastatin  40 mg Oral QHS  . budesonide (PULMICORT) nebulizer solution  0.5 mg Nebulization BID  . feeding supplement (GLUCERNA SHAKE)  237 mL Oral TID BM  . furosemide  80 mg Intravenous BID  . insulin aspart  0-15 Units Subcutaneous TID AC & HS  . insulin aspart  5 Units Intravenous Once  . insulin aspart protamine- aspart  10 Units Subcutaneous  BID WC  . ipratropium-albuterol  3 mL Nebulization Q6H  . latanoprost  1 drop Both Eyes QHS  . methylPREDNISolone (SOLU-MEDROL) injection  60 mg Intravenous Q12H  . [START ON 01/28/2021] metolazone  5 mg Oral Daily  . pantoprazole  40 mg Oral BID  . potassium chloride  40 mEq Oral Q3H  . sodium chloride flush  3 mL Intravenous Q12H  . tamsulosin  0.4 mg Oral Daily  . umeclidinium bromide  1 puff Inhalation Daily   Continuous Infusions: . sodium chloride     PRN Meds:.sodium chloride, acetaminophen, famotidine, fluticasone, ipratropium-albuterol, ondansetron (ZOFRAN) IV, oxyCODONE-acetaminophen **AND** oxyCODONE, polyvinyl alcohol, sodium chloride flush  Antimicrobials: Anti-infectives (From admission, onward)   None       I have personally reviewed the following labs and images: CBC: Recent Labs  Lab 01/26/21 2142 01/27/21 0315  WBC 5.8 5.5  NEUTROABS 4.3 3.7  HGB 8.9* 8.2*  HCT  30.1* 26.8*  MCV 87.2 85.6  PLT 402* 376   BMP &GFR Recent Labs  Lab 01/26/21 2142 01/26/21 2319 01/27/21 0315  NA 132* 136 137  K 6.2* 3.4* 3.4*  CL 93* 95* 95*  CO2 _0 GLUCOSE 199* 139* 127*  BUN 72* 70* 70*  CREATININE 3.50* 3.42* 3.36*  CALCIUM 8.3* 8.5* 8.4*  MG  --   --  2.1   Estimated Creatinine Clearance: 25.4 mL/min (A) (by C-G formula based on SCr of 3.36 mg/dL (H)). Liver & Pancreas: Recent Labs  Lab 01/26/21 2142 01/27/21 0315  AST 48* 19  ALT 18 12  ALKPHOS 104 88  BILITOT 1.2 0.3  PROT 6.9 6.5  ALBUMIN 2.8* 2.6*   No results for input(s): LIPASE, AMYLASE in the last 168 hours. No results for input(s): AMMONIA in the last 168 hours. Diabetic: No results for input(s): HGBA1C in the last 72 hours. Recent Labs  Lab 01/27/21 0628  GLUCAP 105*   Cardiac Enzymes: No results for input(s): CKTOTAL, CKMB, CKMBINDEX, TROPONINI in the last 168 hours. No results for input(s): PROBNP in the last 8760 hours. Coagulation Profile: No results for input(s): INR, PROTIME in the last 168 hours. Thyroid Function Tests: No results for input(s): TSH, T4TOTAL, FREET4, T3FREE, THYROIDAB in the last 72 hours. Lipid Profile: No results for input(s): CHOL, HDL, LDLCALC, TRIG, CHOLHDL, LDLDIRECT in the last 72 hours. Anemia Panel: No results for input(s): VITAMINB12, FOLATE, FERRITIN, TIBC, IRON, RETICCTPCT in the last 72 hours. Urine analysis:    Component Value Date/Time   COLORURINE YELLOW 12/11/2020 1107   APPEARANCEUR HAZY (A) 12/11/2020 1107   LABSPEC 1.020 12/11/2020 1107   PHURINE 5.5 12/11/2020 1107   GLUCOSEU NEGATIVE 12/11/2020 1107   HGBUR NEGATIVE 12/11/2020 1107   BILIRUBINUR NEGATIVE 12/11/2020 1107   KETONESUR NEGATIVE 12/11/2020 1107   PROTEINUR NEGATIVE 12/11/2020 1107   UROBILINOGEN 0.2 10/26/2014 2206   NITRITE NEGATIVE 12/11/2020 1107   LEUKOCYTESUR NEGATIVE 12/11/2020 1107   Sepsis Labs: Invalid input(s): PROCALCITONIN,  [place]  Microbiology: Recent Results (from the past 240 hour(s))  Resp Panel by RT-PCR (Flu A&B, Covid) Nasopharyngeal Swab     Status: None   Collection Time: 01/26/21 11:53 PM   Specimen: Nasopharyngeal Swab; Nasopharyngeal(NP) swabs in vial transport medium  Result Value Ref Range Status   SARS Coronavirus 2 by RT PCR NEGATIVE NEGATIVE Final    Comment: (NOTE) SARS-CoV-2 target nucleic acids are NOT DETECTED.  The SARS-CoV-2 RNA is generally detectable in upper respiratory specimens during the acute phase of infection.  The lowest concentration of SARS-CoV-2 viral copies this assay can detect is 138 copies/mL. A negative result does not preclude SARS-Cov-2 infection and should not be used as the sole basis for treatment or other patient management decisions. A negative result may occur with  improper specimen collection/handling, submission of specimen other than nasopharyngeal swab, presence of viral mutation(s) within the areas targeted by this assay, and inadequate number of viral copies(<138 copies/mL). A negative result must be combined with clinical observations, patient history, and epidemiological information. The expected result is Negative.  Fact Sheet for Patients:  EntrepreneurPulse.com.au  Fact Sheet for Healthcare Providers:  IncredibleEmployment.be  This test is no t yet approved or cleared by the [place] FDA and  has been authorized for detection and/or diagnosis of SARS-CoV-2 by FDA under an Emergency Use Authorization (EUA). This EUA will remain  in effect (meaning this test can be used) for the duration of the COVID-19 declaration under Section 564(b)(1) of the Act, 21 U.S.C.section 360bbb-3(b)(1), unless the authorization is terminated  or revoked sooner.       Influenza A by PCR NEGATIVE NEGATIVE Final   Influenza B by PCR NEGATIVE NEGATIVE Final    Comment: (NOTE) The Xpert Xpress SARS-CoV-2/FLU/RSV plus  assay is intended as an aid in the diagnosis of influenza from Nasopharyngeal swab specimens and should not be used as a sole basis for treatment. Nasal washings and aspirates are unacceptable for Xpert Xpress SARS-CoV-2/FLU/RSV testing.  Fact Sheet for Patients: EntrepreneurPulse.com.au  Fact Sheet for Healthcare Providers: IncredibleEmployment.be  This test is not yet approved or cleared by the [place] FDA and has been authorized for detection and/or diagnosis of SARS-CoV-2 by FDA under an Emergency Use Authorization (EUA). This EUA will remain in effect (meaning this test can be used) for the duration of the COVID-19 declaration under Section 564(b)(1) of the Act, 21 U.S.C. section 360bbb-3(b)(1), unless the authorization is terminated or revoked.  Performed at [facility], [place] [address], [place], [place] [address]   MRSA PCR Screening     Status: None   Collection Time: 01/27/21  1:07 AM   Specimen: Nasal Mucosa; Nasopharyngeal  Result Value Ref Range Status   MRSA by PCR NEGATIVE NEGATIVE Final    Comment:        The GeneXpert MRSA Assay (FDA approved for NASAL specimens only), is one component of a comprehensive MRSA colonization surveillance program. It is not intended to diagnose MRSA infection nor to guide or monitor treatment for MRSA infections. Performed at [facility], [place] [address] [place], [place] [address]     Radiology Studies: US RENAL  Result Date: 01/27/2021 CLINICAL DATA:  75 year old male with renal failure. EXAM: RENAL / URINARY TRACT ULTRASOUND COMPLETE COMPARISON:  12/11/2020 FINDINGS: Right Kidney: Renal measurements: 10.3 x 4.7 x 5.9 cm = volume: 147 mL. Echogenicity within normal limits. No mass or hydronephrosis visualized. Left Kidney: Renal measurements: 11.1 x 6.3 x 4.8 cm = volume: 175 mL. Echogenicity within normal limits. No solid mass or hydronephrosis visualized. Two cysts  within the LEFT kidney are noted, measuring 1.1 and 2 cm. Bladder: Appears normal for degree of bladder distention. Other: None. IMPRESSION: Unremarkable kidneys except for 2 LEFT renal cysts. Electronically Signed   By: Margarette Canada M.D.   On: 01/27/2021 09:48   DG Chest Portable 1 View  Result Date: 01/26/2021 CLINICAL DATA:  Shortness of breath. EXAM: PORTABLE CHEST 1 VIEW COMPARISON:  Most recent radiograph 01/05/2021. Chest CT 08/09/2019  reviewed FINDINGS: Chronic cardiomegaly. Progressive pulmonary edema. Retrocardiac opacity likely combination of pleural effusion and atelectasis/airspace disease. Increased right pleural effusion from prior exam. No pneumothorax. IMPRESSION: 1. Progressive pulmonary edema and right pleural effusion from prior exam. 2. Retrocardiac opacity likely combination of pleural fluid and atelectasis/airspace disease. 3. Chronic cardiomegaly. Electronically Signed   By: Keith Rake M.D.   On: 01/26/2021 21:33     [facility]  If 7PM-7AM, please contact night-coverage [email or web address] 01/27/2021, 10:03 AM

## 2021-01-27 NOTE — Social Work (Signed)
CSW acknowledges consult for SNF. The patient will require PT/OT evaluations. CSW will assist with disposition planning once the evaluations have been completed.    CSW will continue to follow.

## 2021-01-27 NOTE — Progress Notes (Signed)
Pt is refusing to wear bipap tonight. RT offered several times and pt still refused.

## 2021-01-27 NOTE — Progress Notes (Signed)
Daughter at bedside. Daughter endorses that pt will not allow spouse or home health employees to assist with home medication administration. Daughter endorses that patient does not want to ambulate in home setting.   This nurse spoke with patient about ongoing goals for this hospital stay, with regards to ambulating. Patient states he would "give anything to walk" when his leg does not hurt. Patient attributes leg pain to the retention of fluid. Verbal contract reached with patient that he will ambulate with this nurse tomorrow afternoon when more fluid comes off, even if it is only standing and transferring to chair.   Also, candy is noted at the bedside. Patient is advised that his sugar is being monitored and that intake of candy will result in additional insulin and potentially slowed healing time.

## 2021-01-27 NOTE — Progress Notes (Signed)
Pt refusing Bipap for tonight, states he will try tomorrow night.

## 2021-01-27 NOTE — Plan of Care (Signed)
Problem: Education: Goal: Knowledge of General Education information will improve Description: Including pain rating scale, medication(s)/side effects and non-pharmacologic comfort measures Outcome: Progressing   Problem: Clinical Measurements: Goal: Will remain free from infection Outcome: Progressing Goal: Cardiovascular complication will be avoided Outcome: Progressing   Problem: Nutrition: Goal: Adequate nutrition will be maintained Outcome: Progressing   Problem: Coping: Goal: Level of anxiety will decrease Outcome: Progressing   Problem: Elimination: Goal: Will not experience complications related to bowel motility Outcome: Progressing Goal: Will not experience complications related to urinary retention Outcome: Progressing   Problem: Safety: Goal: Ability to remain free from injury will improve Outcome: Progressing   Problem: Skin Integrity: Goal: Risk for impaired skin integrity will decrease Outcome: Progressing   Problem: Health Behavior/Discharge Planning: Goal: Ability to manage health-related needs will improve Outcome: Not Progressing   Problem: Clinical Measurements: Goal: Ability to maintain clinical measurements within normal limits will improve Outcome: Not Progressing Goal: Diagnostic test results will improve Outcome: Not Progressing Goal: Respiratory complications will improve Outcome: Not Progressing   Problem: Activity: Goal: Risk for activity intolerance will decrease Outcome: Not Progressing   Problem: Pain Managment: Goal: General experience of comfort will improve Outcome: Not Progressing

## 2021-01-28 DIAGNOSIS — G894 Chronic pain syndrome: Secondary | ICD-10-CM

## 2021-01-28 LAB — UREA NITROGEN, URINE: Urea Nitrogen, Ur: 260 mg/dL

## 2021-01-28 LAB — HEMOGLOBIN AND HEMATOCRIT, BLOOD
HCT: 27.5 % — ABNORMAL LOW (ref 39.0–52.0)
Hemoglobin: 8.4 g/dL — ABNORMAL LOW (ref 13.0–17.0)

## 2021-01-28 LAB — RENAL FUNCTION PANEL
Albumin: 2.6 g/dL — ABNORMAL LOW (ref 3.5–5.0)
Anion gap: 12 (ref 5–15)
BUN: 73 mg/dL — ABNORMAL HIGH (ref 8–23)
CO2: 33 mmol/L — ABNORMAL HIGH (ref 22–32)
Calcium: 9 mg/dL (ref 8.9–10.3)
Chloride: 94 mmol/L — ABNORMAL LOW (ref 98–111)
Creatinine, Ser: 3.26 mg/dL — ABNORMAL HIGH (ref 0.61–1.24)
GFR, Estimated: 19 mL/min — ABNORMAL LOW (ref >60)
Glucose, Bld: 225 mg/dL — ABNORMAL HIGH (ref 70–99)
Phosphorus: 6.2 mg/dL — ABNORMAL HIGH (ref 2.5–4.6)
Potassium: 3.5 mmol/L (ref 3.5–5.1)
Sodium: 139 mmol/L (ref 135–145)

## 2021-01-28 LAB — MAGNESIUM: Magnesium: 2.2 mg/dL (ref 1.7–2.4)

## 2021-01-28 LAB — HEMOGLOBIN A1C
Hgb A1c MFr Bld: 6.8 % — ABNORMAL HIGH (ref 4.8–5.6)
Mean Plasma Glucose: 148.46 mg/dL

## 2021-01-28 LAB — GLUCOSE, CAPILLARY
Glucose-Capillary: 192 mg/dL — ABNORMAL HIGH (ref 70–99)
Glucose-Capillary: 226 mg/dL — ABNORMAL HIGH (ref 70–99)
Glucose-Capillary: 147 mg/dL — ABNORMAL HIGH (ref 70–99)
Glucose-Capillary: 148 mg/dL — ABNORMAL HIGH (ref 70–99)

## 2021-01-28 IMAGING — DX DG CHEST 1V PORT
1 series · 1 of 1 positions shown · non-contrast
Comparison: 08/25/2019

CLINICAL DATA: Interval extubation

EXAM:
PORTABLE CHEST 1 VIEW

[chest ap]
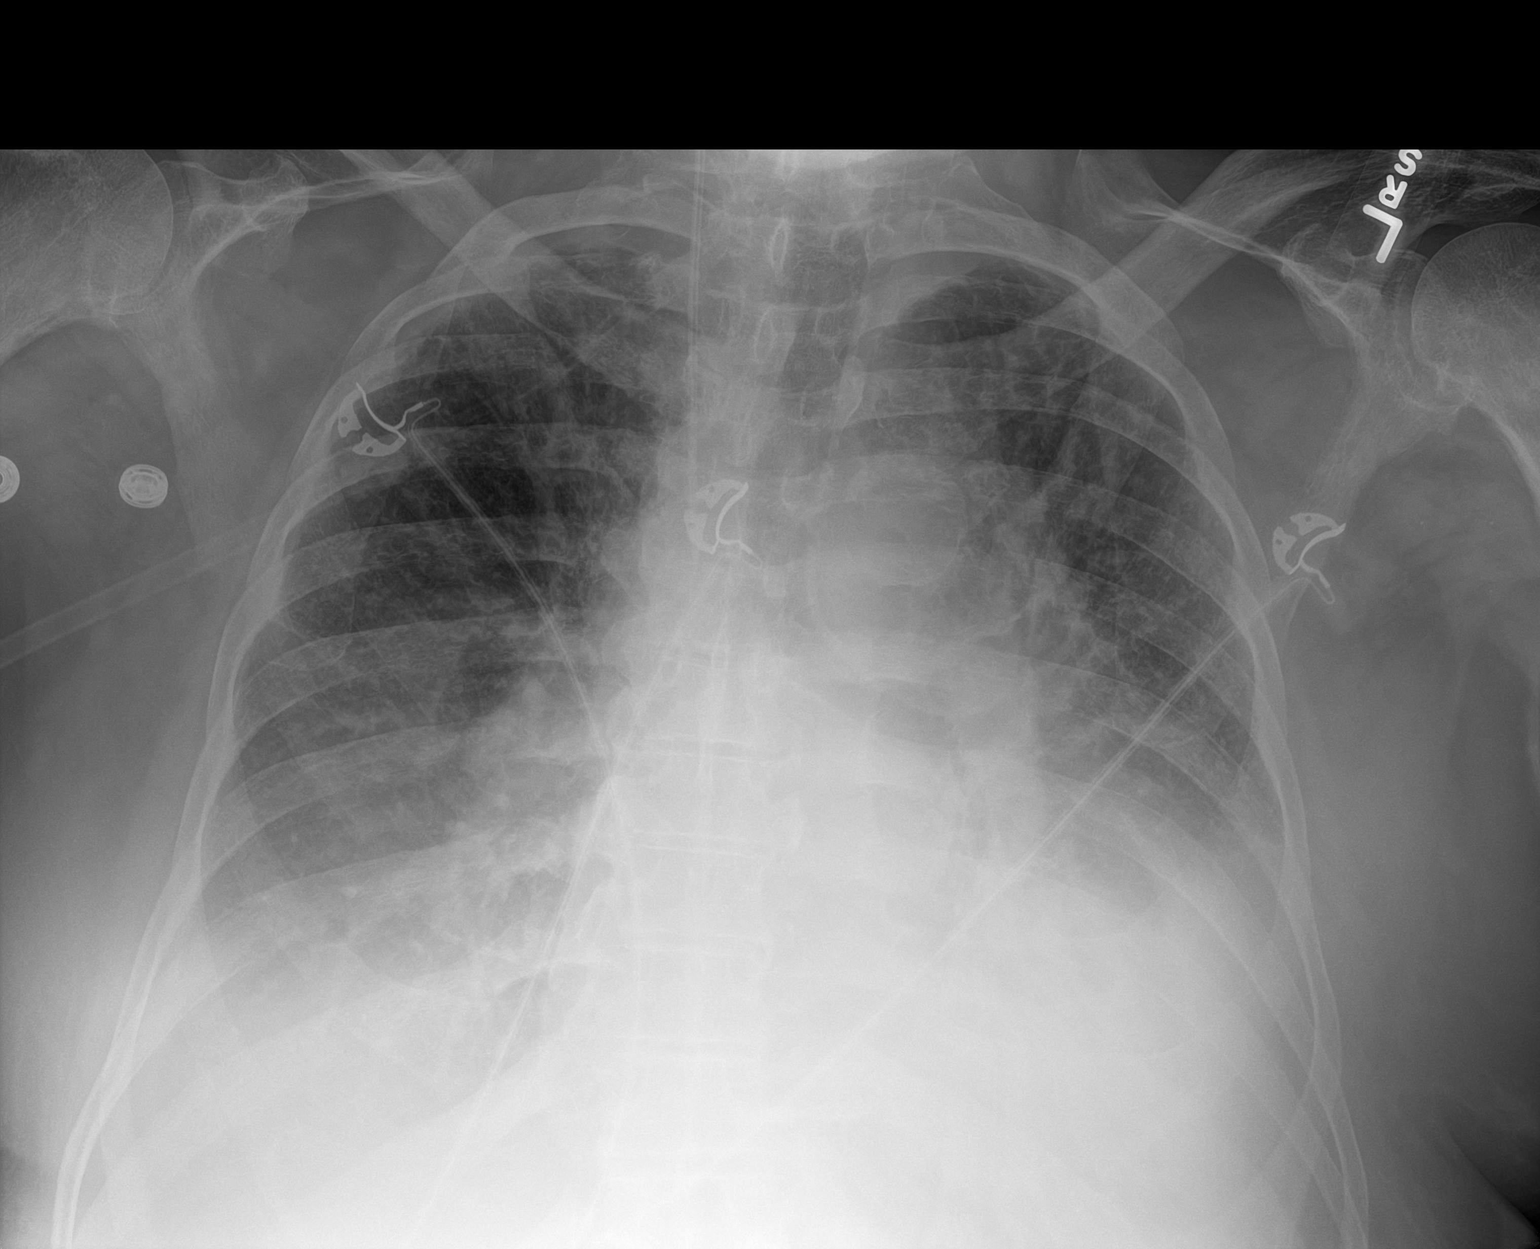

[1 of 1 positions shown; findings below may reference images not displayed]

FINDINGS: Cardiac shadow is stable. Aortic calcifications are again seen.
Right jugular catheter is again noted. Endotracheal tube and gastric
catheter have been removed in the interval. Bilateral pleural
effusions and patchy bilateral pulmonary opacities are stable.
Vascular congestion is seen. No bony abnormality is noted.
IMPRESSION: Stable vascular congestion and bilateral effusions. Stable bilateral
opacities likely related to edema are seen.

## 2021-01-28 MED ORDER — IPRATROPIUM-ALBUTEROL 0.5-2.5 (3) MG/3ML IN SOLN
3.0000 mL | Freq: Two times a day (BID) | RESPIRATORY_TRACT | Status: DC
  Administered 2021-01-28 – 2021-02-03 (×12): 3 mL via RESPIRATORY_TRACT
  Filled 2021-01-28 (×12): qty 3

## 2021-01-28 MED ORDER — OXYCODONE-ACETAMINOPHEN 5-325 MG PO TABS
1.0000 | ORAL_TABLET | ORAL | Status: DC | PRN
  Administered 2021-01-28: 1 via ORAL
  Filled 2021-01-28: qty 1

## 2021-01-28 MED ORDER — OXYCODONE HCL 5 MG PO TABS
5.0000 mg | ORAL_TABLET | ORAL | Status: DC | PRN
  Administered 2021-01-28 – 2021-02-03 (×21): 5 mg via ORAL
  Filled 2021-01-28 (×21): qty 1

## 2021-01-28 MED ORDER — INSULIN ASPART 100 UNIT/ML ~~LOC~~ SOLN
5.0000 [IU] | Freq: Three times a day (TID) | SUBCUTANEOUS | Status: DC
  Administered 2021-01-28 – 2021-02-01 (×10): 5 [IU] via SUBCUTANEOUS

## 2021-01-28 MED ORDER — INSULIN DETEMIR 100 UNIT/ML ~~LOC~~ SOLN
15.0000 [IU] | Freq: Two times a day (BID) | SUBCUTANEOUS | Status: DC
  Administered 2021-01-28 – 2021-02-01 (×9): 15 [IU] via SUBCUTANEOUS
  Filled 2021-01-28 (×13): qty 0.15

## 2021-01-28 MED ORDER — OXYCODONE HCL 5 MG PO TABS
5.0000 mg | ORAL_TABLET | Freq: Four times a day (QID) | ORAL | Status: DC | PRN
Start: 2021-01-28 — End: 2021-01-28
  Administered 2021-01-28: 5 mg via ORAL
  Filled 2021-01-28: qty 1

## 2021-01-28 MED ORDER — FUROSEMIDE 10 MG/ML IJ SOLN
60.0000 mg | Freq: Two times a day (BID) | INTRAMUSCULAR | Status: DC
  Administered 2021-01-28 – 2021-01-30 (×5): 60 mg via INTRAVENOUS
  Filled 2021-01-28 (×5): qty 6

## 2021-01-28 MED ORDER — OXYCODONE-ACETAMINOPHEN 5-325 MG PO TABS
1.0000 | ORAL_TABLET | ORAL | Status: DC | PRN
  Administered 2021-01-28 – 2021-02-03 (×19): 1 via ORAL
  Filled 2021-01-28 (×19): qty 1

## 2021-01-28 NOTE — Evaluation (Signed)
Physical Therapy Evaluation Patient Details Name: Nicholas Caldwell MRN: [id number] DOB: 1946/08/30 Today's Date: 01/28/2021   History of Present Illness  Pt is a 75 y/o male admitted 3/26 secondary to worsening LE edema and SOB due to CHF exacerbation. Pt with progressive bil LE weakness with some Rt foot drop. PMHx:Afib, CAD, CHF, COPD, DM, obesity, NICM, HTN, pulmonary HTN, BPH, HLD  Clinical Impression  Pt admitted with/for continued CHF.  Now even weaker and easy to fatigue.  Pt needing min to light moderate assist at times for basic mobility and gait. Marland Kitchen  Pt currently limited functionally due to the problems listed below.  (see problems list.)  Pt will benefit from PT to maximize function and safety to be able to get home safely with available assist.     Follow Up Recommendations Home health PT;Supervision/Assistance - 24 hour (going for HHPT, will revisit if pt progresses slowly)    Equipment Recommendations  None recommended by PT    Recommendations for Other Services       Precautions / Restrictions Precautions Precautions: Fall      Mobility  Bed Mobility Overal bed mobility: Needs Assistance Bed Mobility: Sit to Supine       Sit to supine: Min assist;Mod assist   General bed mobility comments: help with L LE, assist repositioning in the bed.    Transfers Overall transfer level: Needs assistance Equipment used: Rolling walker (2 wheeled) Transfers: Sit to/from [facility] Sit to Stand: Min assist;From elevated surface Stand pivot transfers: Min assist       General transfer comment: cues for hand placement/getting to [place] of the chair.  assist forward and boost.  Ambulation/Gait Ambulation/Gait assistance: Min assist Gait Distance (Feet): 6 Feet (then 80 ' then 18' with rest in between)   Gait Pattern/deviations: Step-to pattern;Step-through pattern;Decreased stride length   Gait velocity interpretation: <1.31 ft/sec, indicative of household  ambulator General Gait Details: weak, mild unsteady short steps with flexed posture, moderately heavy use of the RW,  Sats 87-89% on 4L, 89-90% on 6L, dropping to 88% once sitting, dyspnea 2-3/4.  2-3 min recovery in sitting.  Stairs            Wheelchair Mobility    Modified Rankin (Stroke Patients Only)       Balance Overall balance assessment: Needs assistance   Sitting balance-Leahy Scale: Fair       Standing balance-Leahy Scale: Poor Standing balance comment: reliant on AD or stationary object                             Pertinent Vitals/Pain Pain Assessment: Faces Faces Pain Scale: Hurts a little bit Pain Location: R hip/le Pain Descriptors / Indicators: Aching Pain Intervention(s): Monitored during session    Home Living Family/patient expects to be discharged to:: Private residence Living Arrangements: Spouse/significant other Available Help at Discharge: Family;Available 24 hours/day Type of Home: House Home Access: Stairs to enter Entrance Stairs-Rails: None Entrance Stairs-Number of Steps: 1 Home Layout: One level Home Equipment: Walker - 2 wheels;Wheelchair - manual;Bedside commode;Grab bars - tub/shower;Shower seat;Electric scooter;Walker - 4 wheels;Other (comment) Additional Comments: on 3.5 L of oxygen at home.  Per pt, he is going to get some help for home from an agency, they haven't been there yet.    Prior Function Level of Independence: Needs assistance   Gait / Transfers Assistance Needed: Has been able to stand and transfer to [facility] or [facility] using  RW.  ADL's / Homemaking Assistance Needed: Spouse assists with LB ADLs        Hand Dominance        Extremity/Trunk Assessment   Upper Extremity Assessment Upper Extremity Assessment: Defer to OT evaluation (generally weak)    Lower Extremity Assessment Lower Extremity Assessment: Generalized weakness       Communication      Cognition Arousal/Alertness:  Awake/alert Behavior During Therapy: WFL for tasks assessed/performed Overall Cognitive Status: History of cognitive impairments - at baseline                                        General Comments General comments (skin integrity, edema, etc.): sats on 4L at rest 95%.  SpO2 during gait on 4L dropped to 87/88%.  On 6L during gait 89/90% at best.    Exercises     Assessment/Plan    PT Assessment Patient needs continued PT services  PT Problem List Decreased strength;Decreased mobility;Decreased safety awareness;Decreased range of motion;Decreased knowledge of precautions;Decreased activity tolerance;Cardiopulmonary status limiting activity;Decreased balance;Decreased knowledge of use of DME;Pain;Decreased cognition       PT Treatment Interventions DME instruction;Gait training;Functional mobility training;Therapeutic activities;Therapeutic exercise;Patient/family education    PT Goals (Current goals can be found in the Care Plan section)  Acute Rehab PT Goals Patient Stated Goal: to feel better PT Goal Formulation: With patient Time For Goal Achievement: 02/11/21 Potential to Achieve Goals: Fair    Frequency Min 3X/week   Barriers to discharge        Co-evaluation               AM-PAC PT "6 Clicks" Mobility  Outcome Measure Help needed turning from your back to your side while in a flat bed without using bedrails?: A Little Help needed moving from lying on your back to sitting on the side of a flat bed without using bedrails?: A Lot Help needed moving to and from a bed to a chair (including a wheelchair)?: A Lot Help needed standing up from a chair using your arms (e.g., wheelchair or bedside chair)?: A Little Help needed to walk in hospital room?: A Little Help needed climbing 3-5 steps with a railing? : A Lot 6 Click Score: 15    End of Session Equipment Utilized During Treatment: Oxygen Activity Tolerance: Patient tolerated treatment  well;Patient limited by fatigue Patient left: in bed;with call bell/phone within reach;with bed alarm set Nurse Communication: Mobility status PT Visit Diagnosis: Unsteadiness on feet (R26.81);Muscle weakness (generalized) (M62.81);Difficulty in walking, not elsewhere classified (R26.2)    Time: 4037-5436 PT Time Calculation (min) (ACUTE ONLY): 40 min   Charges:   PT Evaluation $PT Eval Moderate Complexity: 1 Mod PT Treatments $Gait Training: 8-22 mins $Therapeutic Activity: 8-22 mins        01/28/2021  Ginger Carne., PT Acute Rehabilitation Services [phone]  (pager) [phone]  (office)01/28/2021  Ginger Carne., PT Acute Rehabilitation Services [phone]  (pager) [phone]  (office)01/28/2021  Ginger Carne., PT Acute Rehabilitation Services [phone]  (pager) [phone]  (office)  Tessie Fass Dasia Guerrier 01/28/2021, 1:31 PM

## 2021-01-28 NOTE — TOC Progression Note (Addendum)
Transition of Care [facility]) - Progression Note    Patient Details  Name: Nicholas Caldwell MRN: [id number] Date of Birth: 07/29/1946  [facility] CM/SW Contact  Zenon Mayo, RN Phone Number: 01/28/2021, 2:08 PM  Clinical Narrative:    Patient lives at home with wife who was assisting him, but wife had to go to family emergency in [facility] with daughter, she will be stretched thin between patient and her family emergency.  If he needs more assistance he may need SNF. Patient is active with [facility] for [facility], [place], [place], aide and SW.        Expected Discharge Plan and Services                                                 Social Determinants of Health (SDOH) Interventions    Readmission Risk Interventions Readmission Risk Prevention Plan 01/09/2021 11/27/2020 11/23/2020  Transportation Screening Complete - Complete  PCP or Specialist Appt within 3-5 Days - Complete -  HRI or [place] - Complete -  Social Work Consult for [place] Planning/Counseling - Complete -  Palliative Care Screening - Not Applicable -  Medication Review Press photographer) Complete Complete Complete  Med Review Comments - - -  PCP or Specialist appointment within 3-5 days of discharge - - -  PCP/Specialist Appt Not Complete comments - - -  [place] or Home Care Consult Complete - Complete  SW Recovery Care/Counseling Consult Complete - Complete  Palliative Care Screening Not Applicable - Not [place] Patient Refused - Not Applicable  Some recent data might be hidden

## 2021-01-28 NOTE — Consult Note (Signed)
WOC Nurse Consult Note: Reason for Consult: Stage 2 pressure injury to left upper gluteal. Resolved/newly epithelialized pressure injury to sacrum.  Pink/fragile skin present and is protected with silicone foam and frequent repositioning as patient will allow. Due to shortness of breath, he must sit up nearly at a 90 degree angle and prefers knees gatched.  Explained importance of turning and repositioning to offload pressure while in bed. He verbalizes understanding. His wife cares for him at home and overall skin looks supple and intact.  Wound type:pressure injury, stage 2 left buttock and stage 3 sacral, newly epithelialized.  Pressure Injury POA: Yes Measurement:left gluteal:  2 cm x 0.4 cm x 0.1 cm  Sacrum:  3 cm x 2 cm intact skin with no return of melanin at this time.  Wound NRW:CHJS and moist Drainage (amount, consistency, odor) scant weeping  No odor.  Periwound:intact  Skin is supple.   Dressing procedure/placement/frequency:Cleanse skin to sacrum and left buttock with soap and water.  Silicone foam dressing.  Change every three days and PRN soilage. Encourage to turn and reposition every two hours.  Will not follow at this time.  Please re-consult if needed.  Domenic Moras MSN, RN, FNP-BC CWON Wound, Ostomy, Continence Nurse Pager [phone]

## 2021-01-28 NOTE — Progress Notes (Signed)
OT Cancellation Note  Patient Details Name: Nicholas Caldwell MRN: [id number] DOB: 1946-10-11   Cancelled Treatment:    Reason Eval/Treat Not Completed: Other (comment) (RN and NT in room discussing the premofit and pt reporting frustration with urinary incontinence and being wet in the bed. Will return as schedule allows.)  [place], OTR/L Acute Rehab Pager: [phone] Office: [phone] 01/28/2021, 2:41 PM

## 2021-01-28 NOTE — Progress Notes (Signed)
Pt states that he does not want to take Solu-medrol tonight because it keeps him awake at night. Pt states he wants to just take his morning dose and talk to the doctor about moving his evening dose to an earlier time. MD notified.

## 2021-01-28 NOTE — Progress Notes (Signed)
RT NOTE:  Pts home BIPAP setup @ bedside within reach. Pt states he wears his nasal canula with the mask and doesn't want it bled inline. He does not want water in humidity chamber. He puts on when he is ready for bed. RN aware to call RT if help is needed.

## 2021-01-28 NOTE — Progress Notes (Signed)
PROGRESS NOTE  Nicholas Caldwell BJY:[id number] DOB: October 31, 1946   PCP: [name], MD  Patient is from: Home.  Lives with his wife.  Uses cane, walker and wheelchair at baseline.  DOA: 01/26/2021 LOS: 1  Chief complaints: Shortness of breath and generalized weakness  Brief Narrative / Interim history: 74 year old M with PMH of combined CHF, COPD, OSA on CPAP, chronic hypoxemic RF on 3 L, PAH, PAF, IDDM-2, CKD-3B chronic pain syndrome, HTN, BPH, HLD and debility presenting with shortness of breath, generalized weakness, PND, orthopnea, edema and increased abdominal girth and admitted for acute on chronic combined CHF, possible COPD exacerbation and AKI on CKD-3B.  Has elevated BNP higher than baseline and CXR concerning for CHF.  Started on IV Lasix.   Subjective: Seen and examined earlier this morning.  No major events overnight of this morning.  Refused to wear BiPAP setting he is not comfortable with the mask they have here.  He says his family could bring his home BiPAP.  Also complains right hip and back pain which is chronic.  He says he could work with therapy due to this pain.  He says he takes Norco 4 times a day at home. He continues to endorse shortness of breath. He says he still has extra fluid.  Denies GI or UTI symptoms.  Objective: Vitals:   01/28/21 0823 01/28/21 0825 01/28/21 0830 01/28/21 0857  BP:    127/69  Pulse:    72  Resp:    20  Temp:    98.4 F (36.9 C)  TempSrc:    Oral  SpO2: 93% 97% 96% 94%  Weight:      Height:        Intake/Output Summary (Last 24 hours) at 01/28/2021 1217 Last data filed at 01/28/2021 1000 Gross per 24 hour  Intake 1347 ml  Output 1300 ml  Net 47 ml   Filed Weights   01/26/21 2105 01/27/21 0100 01/28/21 0337  Weight: 110.2 kg 109.5 kg 111.8 kg    Examination:  GENERAL: No apparent distress.  Nontoxic. HEENT: MMM.  Vision and hearing grossly intact.  NECK: Supple.  No apparent JVD.  RESP: 91% on 6 L.  No IWOB.  Fair  aeration with rhonchi bilaterally but improved. CVS:  RRR. Heart sounds normal.  ABD/GI/GU: BS+. Abd soft, NTND.  MSK/EXT:  Moves extremities. No apparent deformity.  Trace edema bilaterally, Rt>Lt SKIN: no apparent skin lesion or wound NEURO: Awake, alert and oriented appropriately.  No apparent focal neuro deficit. PSYCH: Calm. Normal affect.   Procedures:  None  Microbiology summarized: [id number] and influenza PCR nonreactive.  Assessment & Plan: Acute on chronic combined CHF/BiV HF: Presents with cardinal symptoms.  Reports good compliance with medication.  BNP 3600 (higher than baseline).  CXR with progressive pulmonary edema and right pleural effusion from prior exam.  Started on IV Lasix.  Renal function improved. I&O is incomplete.  TEE in 12/2020 with LVEF of 45 to 50%, global hypokinesis, severely reduced RVSF and severely enlarged RV.  About 3.5 L UOP/24 hours.  Creatinine down but BUN slightly up. -Decrease IV Lasix to 60 mg twice daily -Continue metolazone 5 mg daily -Monitor fluid status, renal functions and electrolytes -Sodium and fluid restrictions.  COPD with acute exacerbation [facility]): Reportedly had wheezing that seems to have resolved.  No chest tightness today. -Continue Solu-Medrol, Brovana, Incruse Ellipta and as needed DuoNeb -Doubt need for antibiotic without productive cough  Chronic hypoxemic respiratory failure with 3 L at baseline.  No documented desaturation but saturating at 91% 6 L while I was in the room although he does not seems to have increased work of breathing -Wean oxygen as able -Treat treatable causes -Incentive spirometry, OOB, PT/OT -Encouraged him to wear his BiPAP at night. He says his family will bring his mask from home  OSA (obstructive sleep apnea) on BiPAP: Has not been compliant with this here -See discussion and plan above  RLE edema/swelling: Could be due to CHF.  RLE [place] negative for DVT.  Patient is already on Eliquis.  Elevated  troponin: Likely demand ischemia in the setting of CHF and delayed clearance in the setting of AKI.  No significant delta. -Manage CHF and AKI accordingly.  Lactic acidosis: Resolved.  Lactic acidosis: Due to hypoxia?  Hyperkalemia: Resolved.  Hypokalemia: K3.4.  Mg 2.1. -Replenish and recheck.  AKI on CKD-3B/azotemia: Baseline Cr 2.0-2.2. Cardiorenal?  Renal US without significant finding. Cr improving with diuretics. Recent Labs    12/19/20 0158 01/05/21 2213 01/07/21 0057 01/08/21 0032 01/09/21 0119 01/10/21 0032 01/26/21 2142 01/26/21 2319 01/27/21 0315 01/28/21 0420  BUN 37* 49* 47* 41* 41* 40* 72* 70* 70* 73*  CREATININE 2.14* 2.16* 2.11* 1.92* 1.91* 2.11* 3.50* 3.42* 3.36* 3.26*  -Continue monitoring -Monitor urine output  Paroxysmal A. fib: Rate controlled.  Post DCCV on 3/10. -Continue home amiodarone and Eliquis -Optimize K and Mg  History of nonobstructive CAD: Reports chest tightness likely from CHF and possible COPD exacerbation.  Mild troponin elevation to 40s but no significant delta suggesting demand ischemia. -Medical management   IDDM-2 with hyperlipidemia and hyperlipidemia: A1c 6.8% on 2/80/22.  Hyperglycemia likely due to steroid. Recent Labs  Lab 01/27/21 1103 01/27/21 1621 01/27/21 2211 01/28/21 0542 01/28/21 1203  GLUCAP 124* 141* 179* 192* 226*  -Check hemoglobin A1c -Continue SSI-moderate. -Increase mealtime coverage from 3 to 5 units -Increase Levemir from 12 to 15 units twice daily starting tonight -Further adjustment as appropriate  Debility/physical deconditioning: Uses cane, walker and wheelchair at baseline.  Reports progressive lower extremity weakness for about 6 months.  Previously evaluated by neurology who recommended outpatient follow-up but never happened.  -Encourage to work with PT/OT -Ordered ambulatory referral to neurology on discharge  Chronic right hip/back pain: Takes Norco 10/325 at home.  He gets 150 tablets  every months.  -Continue home Norco for now   Class I obesity Body mass index is 33.43 kg/m.        Pressure skin injury: POA Pressure Injury 01/27/21 Buttocks Left;Mid Stage 2 -  Partial thickness loss of dermis presenting as a shallow open injury with a red, pink wound bed without slough. pink 0.5 cm x 0.5 cm (Active)  01/27/21 0100  Location: Buttocks  Location Orientation: Left;Mid  Staging: Stage 2 -  Partial thickness loss of dermis presenting as a shallow open injury with a red, pink wound bed without slough.  Wound Description (Comments): pink 0.5 cm x 0.5 cm  Present on Admission: Yes   DVT prophylaxis:  Place and maintain sequential compression device Start: 01/27/21 1533 apixaban (ELIQUIS) tablet 5 mg  Code Status: Full code Family Communication: Patient and/or RN. Available if any question.  Level of care: Telemetry Cardiac Status is: Inpatient  Remains inpatient appropriate because:Unsafe d/c plan, IV treatments appropriate due to intensity of illness or inability to take PO and Inpatient level of care appropriate due to severity of illness   Dispo: The patient is from: Home  Anticipated d/c is to: Home              Patient currently is not medically stable to d/c.   Difficult to place patient No            Consultants:  None   Sch Meds:  Scheduled Meds: . amiodarone  200 mg Oral Daily  . apixaban  5 mg Oral BID  . arformoterol  15 mcg Nebulization BID  . atorvastatin  40 mg Oral QHS  . budesonide (PULMICORT) nebulizer solution  0.5 mg Nebulization BID  . feeding supplement (GLUCERNA SHAKE)  237 mL Oral TID BM  . furosemide  60 mg Intravenous BID  . insulin aspart  0-15 Units Subcutaneous TID WC  . insulin aspart  0-5 Units Subcutaneous QHS  . insulin aspart  3 Units Subcutaneous TID WC  . insulin aspart  5 Units Intravenous Once  . insulin detemir  12 Units Subcutaneous BID  . ipratropium-albuterol  3 mL Nebulization BID  .  latanoprost  1 drop Both Eyes QHS  . methylPREDNISolone (SOLU-MEDROL) injection  60 mg Intravenous Q12H  . metolazone  5 mg Oral Daily  . pantoprazole  40 mg Oral BID  . sodium chloride flush  3 mL Intravenous Q12H  . tamsulosin  0.4 mg Oral Daily  . umeclidinium bromide  1 puff Inhalation Daily   Continuous Infusions: . sodium chloride     PRN Meds:.sodium chloride, acetaminophen, famotidine, fluticasone, ipratropium-albuterol, ondansetron (ZOFRAN) IV, oxyCODONE-acetaminophen **AND** oxyCODONE, polyvinyl alcohol, sodium chloride flush  Antimicrobials: Anti-infectives (From admission, onward)   None       I have personally reviewed the following labs and images: CBC: Recent Labs  Lab 01/26/21 2142 01/27/21 0315 01/28/21 0420  WBC 5.8 5.5  --   NEUTROABS 4.3 3.7  --   HGB 8.9* 8.2* 8.4*  HCT 30.1* 26.8* 27.5*  MCV 87.2 85.6  --   PLT 402* 376  --    BMP &GFR Recent Labs  Lab 01/26/21 2142 01/26/21 2319 01/27/21 0315 01/28/21 0420  NA 132* 136 137 139  K 6.2* 3.4* 3.4* 3.5  CL 93* 95* 95* 94*  CO2 _0 33*  GLUCOSE 199* 139* 127* 225*  BUN 72* 70* 70* 73*  CREATININE 3.50* 3.42* 3.36* 3.26*  CALCIUM 8.3* 8.5* 8.4* 9.0  MG  --   --  2.1 2.2  PHOS  --   --   --  6.2*   Estimated Creatinine Clearance: 26.5 mL/min (A) (by C-G formula based on SCr of 3.26 mg/dL (H)). Liver & Pancreas: Recent Labs  Lab 01/26/21 2142 01/27/21 0315 01/28/21 0420  AST 48* 19  --   ALT 18 12  --   ALKPHOS 104 88  --   BILITOT 1.2 0.3  --   PROT 6.9 6.5  --   ALBUMIN 2.8* 2.6* 2.6*   No results for input(s): LIPASE, AMYLASE in the last 168 hours. No results for input(s): AMMONIA in the last 168 hours. Diabetic: Recent Labs    01/28/21 0420  HGBA1C 6.8*   Recent Labs  Lab 01/27/21 1103 01/27/21 1621 01/27/21 2211 01/28/21 0542 01/28/21 1203  GLUCAP 124* 141* 179* 192* 226*   Cardiac Enzymes: No results for input(s): CKTOTAL, CKMB, CKMBINDEX, TROPONINI in the  last 168 hours. No results for input(s): PROBNP in the last 8760 hours. Coagulation Profile: No results for input(s): INR, PROTIME in the last 168 hours. Thyroid Function Tests: No results for input(s): TSH, T4TOTAL,  FREET4, T3FREE, THYROIDAB in the last 72 hours. Lipid Profile: No results for input(s): CHOL, HDL, LDLCALC, TRIG, CHOLHDL, LDLDIRECT in the last 72 hours. Anemia Panel: No results for input(s): VITAMINB12, FOLATE, FERRITIN, TIBC, IRON, RETICCTPCT in the last 72 hours. Urine analysis:    Component Value Date/Time   COLORURINE YELLOW 12/11/2020 1107   APPEARANCEUR HAZY (A) 12/11/2020 1107   LABSPEC 1.020 12/11/2020 1107   PHURINE 5.5 12/11/2020 1107   GLUCOSEU NEGATIVE 12/11/2020 1107   HGBUR NEGATIVE 12/11/2020 1107   BILIRUBINUR NEGATIVE 12/11/2020 1107   KETONESUR NEGATIVE 12/11/2020 1107   PROTEINUR NEGATIVE 12/11/2020 1107   UROBILINOGEN 0.2 10/26/2014 2206   NITRITE NEGATIVE 12/11/2020 1107   LEUKOCYTESUR NEGATIVE 12/11/2020 1107   Sepsis Labs: Invalid input(s): PROCALCITONIN, [place]  Microbiology: Recent Results (from the past 240 hour(s))  Resp Panel by RT-PCR (Flu A&B, Covid) Nasopharyngeal Swab     Status: None   Collection Time: 01/26/21 11:53 PM   Specimen: Nasopharyngeal Swab; Nasopharyngeal(NP) swabs in vial transport medium  Result Value Ref Range Status   SARS Coronavirus 2 by RT PCR NEGATIVE NEGATIVE Final    Comment: (NOTE) SARS-CoV-2 target nucleic acids are NOT DETECTED.  The SARS-CoV-2 RNA is generally detectable in upper respiratory specimens during the acute phase of infection. The lowest concentration of SARS-CoV-2 viral copies this assay can detect is 138 copies/mL. A negative result does not preclude SARS-Cov-2 infection and should not be used as the sole basis for treatment or other patient management decisions. A negative result may occur with  improper specimen collection/handling, submission of specimen other than  nasopharyngeal swab, presence of viral mutation(s) within the areas targeted by this assay, and inadequate number of viral copies(<138 copies/mL). A negative result must be combined with clinical observations, patient history, and epidemiological information. The expected result is Negative.  Fact Sheet for Patients:  EntrepreneurPulse.com.au  Fact Sheet for Healthcare Providers:  IncredibleEmployment.be  This test is no t yet approved or cleared by the [place] FDA and  has been authorized for detection and/or diagnosis of SARS-CoV-2 by FDA under an Emergency Use Authorization (EUA). This EUA will remain  in effect (meaning this test can be used) for the duration of the COVID-19 declaration under Section 564(b)(1) of the Act, 21 U.S.C.section 360bbb-3(b)(1), unless the authorization is terminated  or revoked sooner.       Influenza A by PCR NEGATIVE NEGATIVE Final   Influenza B by PCR NEGATIVE NEGATIVE Final    Comment: (NOTE) The Xpert Xpress SARS-CoV-2/FLU/RSV plus assay is intended as an aid in the diagnosis of influenza from Nasopharyngeal swab specimens and should not be used as a sole basis for treatment. Nasal washings and aspirates are unacceptable for Xpert Xpress SARS-CoV-2/FLU/RSV testing.  Fact Sheet for Patients: EntrepreneurPulse.com.au  Fact Sheet for Healthcare Providers: IncredibleEmployment.be  This test is not yet approved or cleared by the [place] FDA and has been authorized for detection and/or diagnosis of SARS-CoV-2 by FDA under an Emergency Use Authorization (EUA). This EUA will remain in effect (meaning this test can be used) for the duration of the COVID-19 declaration under Section 564(b)(1) of the Act, 21 U.S.C. section 360bbb-3(b)(1), unless the authorization is terminated or revoked.  Performed at [facility], [place] [address], [place], [place] [address]    MRSA PCR Screening     Status: None   Collection Time: 01/27/21  1:07 AM   Specimen: Nasal Mucosa; Nasopharyngeal  Result Value Ref Range Status   MRSA by  PCR NEGATIVE NEGATIVE Final    Comment:        The GeneXpert MRSA Assay (FDA approved for NASAL specimens only), is one component of a comprehensive MRSA colonization surveillance program. It is not intended to diagnose MRSA infection nor to guide or monitor treatment for MRSA infections. Performed at [facility], [place] [address], [place], [place] [address]     Radiology Studies: VAS [place] LOWER EXTREMITY VENOUS (DVT)  Result Date: 01/27/2021  Lower Venous DVT Study Indications: Edema, and Right leg greater than left. Other Indications: History congestive heart failure, COPD, CAD. Performing Technologist: Oda Cogan RDMS, RVT  Examination Guidelines: A complete evaluation includes B-mode imaging, spectral Doppler, color Doppler, and power Doppler as needed of all accessible portions of each vessel. Bilateral testing is considered an integral part of a complete examination. Limited examinations for reoccurring indications may be performed as noted. The reflux portion of the exam is performed with the patient in reverse Trendelenburg.  +---------+---------------+---------+-----------+----------+--------------+ RIGHT    CompressibilityPhasicitySpontaneityPropertiesThrombus Aging +---------+---------------+---------+-----------+----------+--------------+ CFV      Full           Yes      Yes                                 +---------+---------------+---------+-----------+----------+--------------+ SFJ      Full                                                        +---------+---------------+---------+-----------+----------+--------------+ FV Prox  Full                                                        +---------+---------------+---------+-----------+----------+--------------+ FV Mid   Full                                                         +---------+---------------+---------+-----------+----------+--------------+ FV DistalFull                                                        +---------+---------------+---------+-----------+----------+--------------+ PFV      Full                                                        +---------+---------------+---------+-----------+----------+--------------+ POP      Full           Yes                                          +---------+---------------+---------+-----------+----------+--------------+  PTV      Full                                                        +---------+---------------+---------+-----------+----------+--------------+ PERO     Full                                                        +---------+---------------+---------+-----------+----------+--------------+   +---------+---------------+---------+-----------+----------+--------------+ LEFT     CompressibilityPhasicitySpontaneityPropertiesThrombus Aging +---------+---------------+---------+-----------+----------+--------------+ CFV      Full           Yes      Yes                                 +---------+---------------+---------+-----------+----------+--------------+ SFJ      Full                                                        +---------+---------------+---------+-----------+----------+--------------+ FV Prox  Full                                                        +---------+---------------+---------+-----------+----------+--------------+ FV Mid   Full                                                        +---------+---------------+---------+-----------+----------+--------------+ FV DistalFull                                                        +---------+---------------+---------+-----------+----------+--------------+ PFV      Full                                                         +---------+---------------+---------+-----------+----------+--------------+ POP      Full           Yes      Yes                                 +---------+---------------+---------+-----------+----------+--------------+ PTV      Full                                                        +---------+---------------+---------+-----------+----------+--------------+  PERO     Full                                                        +---------+---------------+---------+-----------+----------+--------------+     Summary: BILATERAL: - No evidence of deep vein thrombosis seen in the lower extremities, bilaterally. -No evidence of popliteal cyst, bilaterally.   *See table(s) above for measurements and observations.    Preliminary      Taye T. Gordon  If 7PM-7AM, please contact night-coverage [email or web address] 01/28/2021, 12:17 PM

## 2021-01-29 LAB — CBC
HCT: 26.5 % — ABNORMAL LOW (ref 39.0–52.0)
Hemoglobin: 8.1 g/dL — ABNORMAL LOW (ref 13.0–17.0)
MCH: 25.8 pg — ABNORMAL LOW (ref 26.0–34.0)
MCHC: 30.6 g/dL (ref 30.0–36.0)
MCV: 84.4 fL (ref 80.0–100.0)
Platelets: 375 10*3/uL (ref 150–400)
RBC: 3.14 MIL/uL — ABNORMAL LOW (ref 4.22–5.81)
RDW: 18.9 % — ABNORMAL HIGH (ref 11.5–15.5)
WBC: 4.4 10*3/uL (ref 4.0–10.5)
nRBC: 0.7 % — ABNORMAL HIGH (ref 0.0–0.2)

## 2021-01-29 LAB — MAGNESIUM: Magnesium: 2.3 mg/dL (ref 1.7–2.4)

## 2021-01-29 LAB — RENAL FUNCTION PANEL
Albumin: 2.6 g/dL — ABNORMAL LOW (ref 3.5–5.0)
Anion gap: 11 (ref 5–15)
BUN: 78 mg/dL — ABNORMAL HIGH (ref 8–23)
CO2: 39 mmol/L — ABNORMAL HIGH (ref 22–32)
Calcium: 9.1 mg/dL (ref 8.9–10.3)
Chloride: 91 mmol/L — ABNORMAL LOW (ref 98–111)
Creatinine, Ser: 3 mg/dL — ABNORMAL HIGH (ref 0.61–1.24)
GFR, Estimated: 21 mL/min — ABNORMAL LOW (ref >60)
Glucose, Bld: 163 mg/dL — ABNORMAL HIGH (ref 70–99)
Phosphorus: 5.6 mg/dL — ABNORMAL HIGH (ref 2.5–4.6)
Potassium: 3.1 mmol/L — ABNORMAL LOW (ref 3.5–5.1)
Sodium: 141 mmol/L (ref 135–145)

## 2021-01-29 LAB — GLUCOSE, CAPILLARY
Glucose-Capillary: 143 mg/dL — ABNORMAL HIGH (ref 70–99)
Glucose-Capillary: 176 mg/dL — ABNORMAL HIGH (ref 70–99)
Glucose-Capillary: 132 mg/dL — ABNORMAL HIGH (ref 70–99)

## 2021-01-29 IMAGING — DX DG ABD PORTABLE 1V
1 series · 1 of 1 positions shown · non-contrast
Comparison: CT abdomen pelvis dated January 02, 2019. Abdominal x-ray
dated September 06, 2016.

CLINICAL DATA: Abdominal pain.

EXAM:
PORTABLE ABDOMEN - 1 VIEW

[abdomen]
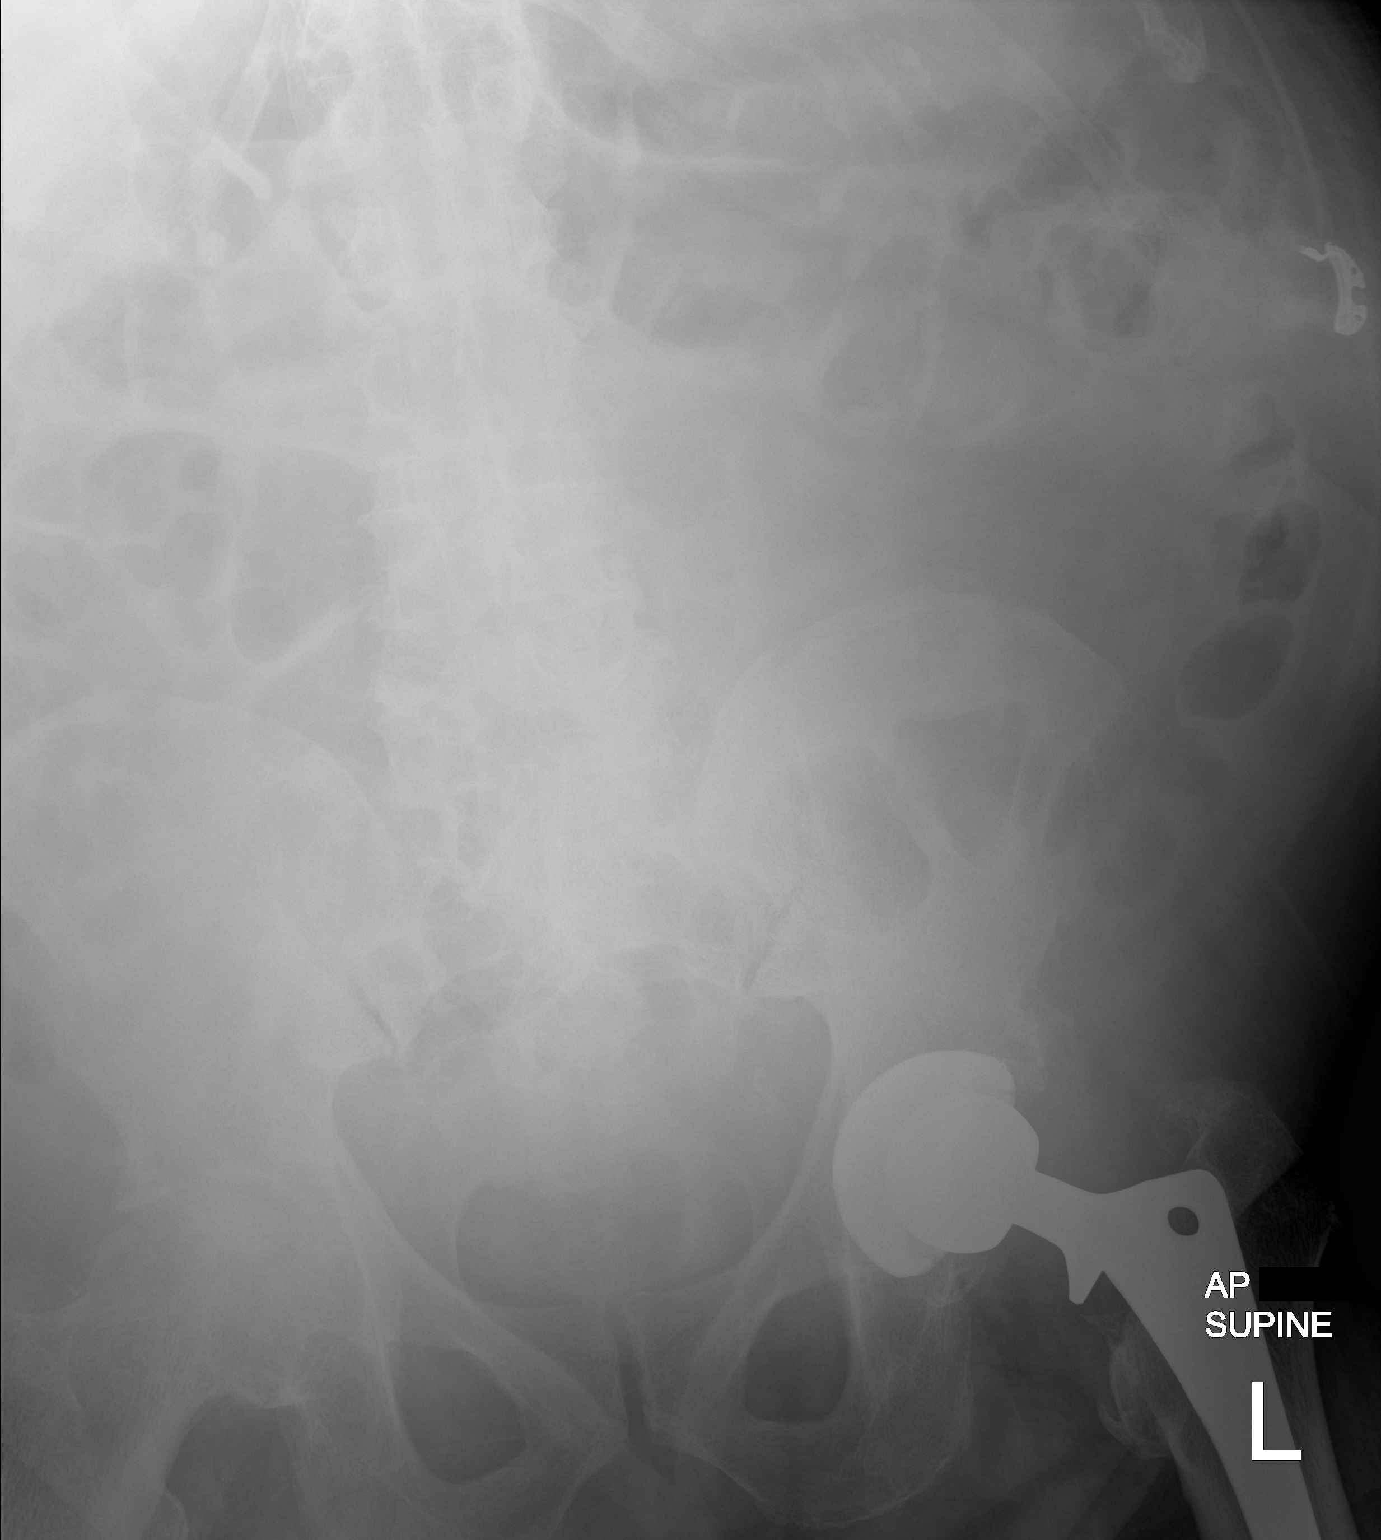

[1 of 1 positions shown; findings below may reference images not displayed]

FINDINGS: The visualized bowel gas pattern is normal. No radio-opaque calculi
or other significant radiographic abnormality are seen. No acute
osseous abnormality. Prior left total hip arthroplasty.
IMPRESSION: Negative.

## 2021-01-29 MED ORDER — POTASSIUM CHLORIDE CRYS ER 20 MEQ PO TBCR
40.0000 meq | EXTENDED_RELEASE_TABLET | ORAL | Status: AC
  Administered 2021-01-29 (×2): 40 meq via ORAL
  Filled 2021-01-29 (×2): qty 2

## 2021-01-29 NOTE — Evaluation (Signed)
Occupational Therapy Evaluation Patient Details Name: Nicholas Caldwell MRN: [id number] DOB: January 24, 1946 Today's Date: 01/29/2021    History of Present Illness Pt is a 75 y/o male admitted 3/26 secondary to worsening LE edema and SOB due to CHF exacerbation. Pt with progressive bil LE weakness with some Rt foot drop. PMHx:Afib, CAD, CHF, COPD, DM, obesity, NICM, HTN, pulmonary HTN, BPH, HLD   Clinical Impression   Pt presents with decline in function and safety with ADLs and ADL mobility with impaired strength, balance and endurance. PTA pt lived at home with his wife and was Ind with UB selfcare, toileting , min A with LB selfcare and used a RW for mobility; home O2. Pt currently requires max - total A with LB ADLs, min A with UB ADls, max A with toileting and min A with transfers/mobility using RW. Pt has poor insight into deficits as well. Pt would benefit from acute OT services to address impairments to maximize level of function and safety    Follow Up Recommendations  Home health OT;Supervision/Assistance - 24 hour    Equipment Recommendations  Wheelchair (measurements OT);Wheelchair cushion (measurements OT)    Recommendations for Other Services       Precautions / Restrictions Precautions Precautions: Fall Restrictions Weight Bearing Restrictions: No      Mobility Bed Mobility               General bed mobility comments: pt in recliner upon arrival    Transfers Overall transfer level: Needs assistance Equipment used: Rolling walker (2 wheeled) Transfers: Sit to/from [facility] Sit to Stand: Min assist;From elevated surface Stand pivot transfers: Min assist       General transfer comment: cues for hand placement    Balance Overall balance assessment: Needs assistance Sitting-balance support: No upper extremity supported Sitting balance-Leahy Scale: Fair     Standing balance support: Bilateral upper extremity supported;During functional  activity Standing balance-Leahy Scale: Poor                             ADL either performed or assessed with clinical judgement   ADL Overall ADL's : Needs assistance/impaired Eating/Feeding: Set up;Independent;Sitting   Grooming: Wash/dry hands;Wash/dry face;Sitting;Set up;Supervision/safety   Upper Body Bathing: Minimal assistance;Sitting Upper Body Bathing Details (indicate cue type and reason): simulated Lower Body Bathing: Total assistance   Upper Body Dressing : Minimal assistance;Sitting   Lower Body Dressing: Total assistance;Sit to/from stand;Cueing for back precautions   Toilet Transfer: Minimal assistance;Stand-pivot;RW;Cueing for safety   Toileting- Clothing Manipulation and Hygiene: Maximal assistance       Functional mobility during ADLs: Minimal assistance;Rolling walker;Cueing for safety       Vision Patient Visual Report: No change from baseline       Perception     Praxis      Pertinent Vitals/Pain Pain Assessment: 0-10 Pain Score: 3  Pain Location: R hip and LE Pain Descriptors / Indicators: Aching Pain Intervention(s): Monitored during session;Repositioned     Hand Dominance Right   Extremity/Trunk Assessment Upper Extremity Assessment Upper Extremity Assessment: Generalized weakness   Lower Extremity Assessment Lower Extremity Assessment: Defer to PT evaluation   Cervical / Trunk Assessment Cervical / Trunk Assessment: Kyphotic   Communication Communication Communication: HOH   Cognition Arousal/Alertness: Awake/alert Behavior During Therapy: WFL for tasks assessed/performed Overall Cognitive Status: History of cognitive impairments - at baseline  General Comments: Pt with poor insight into deficits   General Comments       Exercises     Shoulder Instructions      Home Living Family/patient expects to be discharged to:: Private residence Living Arrangements:  Spouse/significant other Available Help at Discharge: Family;Available 24 hours/day Type of Home: House Home Access: Stairs to enter [employer] of Steps: 1 Entrance Stairs-Rails: None Home Layout: One level     Bathroom Shower/Tub: Occupational psychologist: Standard     Home Equipment: Environmental consultant - 2 wheels;Wheelchair - manual;Bedside commode;Grab bars - tub/shower;Shower seat;Electric scooter;Walker - 4 wheels   Additional Comments: on 3.5 L of oxygen at home.  Per pt, he is going to get some help for home from an agency, they haven't been there yet.      Prior Functioning/Environment Level of Independence: Needs assistance  Gait / Transfers Assistance Needed: was able to stand and transfer to [facility] or [facility] using RW. ADL's / Homemaking Assistance Needed: Spouse assists with LB ADLs            OT Problem List: Decreased strength;Impaired balance (sitting and/or standing);Decreased activity tolerance;Decreased cognition;Decreased knowledge of use of DME or AE;Decreased safety awareness      OT Treatment/Interventions: Self-care/ADL training;Patient/family education;Therapeutic exercise;Therapeutic activities;Balance training    OT Goals(Current goals can be found in the care plan section) Acute Rehab OT Goals Patient Stated Goal: go home OT Goal Formulation: With patient Time For Goal Achievement: 02/12/21 ADL Goals Pt Will Perform Grooming: with min guard assist;with supervision;standing;with caregiver independent in assisting Pt Will Perform Upper Body Bathing: with min guard assist;with supervision;with set-up;sitting;with caregiver independent in assisting Pt Will Perform Lower Body Bathing: with mod assist;sitting/lateral leans;with caregiver independent in assisting Pt Will Perform Upper Body Dressing: with min guard assist;with supervision;with set-up;sitting;with caregiver independent in assisting Pt Will Perform Lower Body Dressing: with max assist;with  mod assist;with caregiver independent in assisting Pt Will Transfer to Toilet: with min guard assist;with supervision;ambulating;stand pivot transfer Pt Will Perform Toileting - Clothing Manipulation and hygiene: with mod assist;with min assist;sit to/from stand;with caregiver independent in assisting  OT Frequency: Min 2X/week   Barriers to D/C:            Co-evaluation              AM-PAC OT "6 Clicks" Daily Activity     Outcome Measure Help from another person eating meals?: None Help from another person taking care of personal grooming?: A Little Help from another person toileting, which includes using toliet, bedpan, or urinal?: A Lot Help from another person bathing (including washing, rinsing, drying)?: A Lot Help from another person to put on and taking off regular upper body clothing?: A Little Help from another person to put on and taking off regular lower body clothing?: Total 6 Click Score: 15   End of Session Equipment Utilized During Treatment: Oxygen;Gait belt;Rolling walker  Activity Tolerance:   Patient left: in chair;with chair alarm set (sitting EOB)  OT Visit Diagnosis: Unsteadiness on feet (R26.81);Other abnormalities of gait and mobility (R26.89);Muscle weakness (generalized) (M62.81);Pain                Time: 5790-3833 OT Time Calculation (min): 25 min Charges:  OT General Charges $OT Visit: 1 Visit OT Evaluation $OT Eval Moderate Complexity: 1 Mod OT Treatments $Self Care/Home Management : 8-22 mins    Britt Bottom 01/29/2021, 1:57 PM

## 2021-01-29 NOTE — Progress Notes (Signed)
PROGRESS NOTE    Wrangler Penning  STM:[id number] DOB: July 10, 1946 DOA: 01/26/2021 PCP: [name], MD    Chief Complaint  Patient presents with  . Shortness of Breath    Brief Narrative:  75 year old M with PMH of combined CHF, COPD, OSA on CPAP, chronic hypoxemic RF on 3 L, PAH, PAF, IDDM-2, CKD-3B chronic pain syndrome, HTN, BPH, HLD and debility presenting with shortness of breath, generalized weakness, PND, orthopnea, edema and increased abdominal girth and admitted for acute on chronic combined CHF, possible COPD exacerbation and AKI on CKD-3B.  Has elevated BNP higher than baseline and CXR concerning for CHF.  Started on IV Lasix.   Patient seen and examined this morning.  He appears frustrated and reports that he needs to go to the bathroom very frequently.  Patient denies any chest pain but reports his breathing has not improved.  Assessment & Plan:   Principal Problem:   Acute on chronic combined systolic and diastolic CHF (congestive heart failure) (HCC) Active Problems:   Essential hypertension   COPD with acute exacerbation (HCC)   Lactic acidosis   PAF (paroxysmal atrial fibrillation) (HCC)   Coronary artery disease involving native coronary artery of native heart   Acute on chronic respiratory failure with hypoxia (HCC)   OSA (obstructive sleep apnea)   Mixed diabetic hyperlipidemia associated with type 2 diabetes mellitus (HCC)   Pressure ulcer of left buttock   Acute renal failure superimposed on stage 3b chronic kidney disease ([place])   Acute cardiogenic pulmonary edema (HCC)   Uncontrolled type 2 diabetes mellitus with hyperglycemia, with long-term current use of insulin (HCC)   Leg weakness, bilateral   Acute on chronic combined CHF/biventricular failure Chest x-ray showed progressive pulmonary edema with right pleural effusion.  Patient was started on IV Lasix, creatinine appears to be stable. Inaccurate intake and output. Last echocardiogram is TEE done in  February 2022 with left ventricular ejection fraction of 45 to 50% with global hypokinesis, severely reduced right ventricular systolic function and enlarged right ventricle. Currently patient is on 60 mg of IV Lasix twice daily along with metolazone. Cardiology to be consulted in the morning.     Acute COPD exacerbation Improving wheezing on Solu-Medrol, bronchodilators.    Chronic respiratory failure with hypoxia requiring 3 L of nasal cannula oxygen to keep sats greater than 90% Currently patient is on 4 L of nasal cannula oxygen with sats greater than 90% Continue with incentive spirometry, and BiPAP at night.    Obstructive sleep apnea on BiPAP Noncompliant.   Right lower extremity and swelling probably secondary to CHF Venous duplex is negative for DVT Patient on Eliquis.   Acute on stage IIIb CKD Probably secondary to acute CHF Improving, baseline creatinine between 2-2.2 Continue to monitor renal parameters.     Paroxysmal atrial fibrillation Rate controlled and currently in sinus S/p DCCV on 01/10/2021 Continue with Eliquis for anticoagulation. Keep potassium greater than 4 and magnesium greater than 2.    History of nonobstructive CAD Mild elevation in troponins probably secondary to demand ischemia from CHF Patient currently denies any chest pain at this time.    Insulin-dependent diabetes mellitus Last A1c 6.8 in February 2022  CBG (last 3)  Recent Labs    01/28/21 2134 01/29/21 0600 01/29/21 1157  GLUCAP 148* 143* 176*    Anemia of chronic disease Hemoglobin stable around 8.   Pressure Injury 01/27/21 Buttocks Left;Mid Stage 2 -  Partial thickness loss of dermis presenting as a shallow open  injury with a red, pink wound bed without slough. pink 0.5 cm x 0.5 cm (Active)  01/27/21 0100  Location: Buttocks  Location Orientation: Left;Mid  Staging: Stage 2 -  Partial thickness loss of dermis presenting as a shallow open injury with a red,  pink wound bed without slough.  Wound Description (Comments): pink 0.5 cm x 0.5 cm  Present on Admission: Yes        DVT prophylaxis: Eliquis code Status: Full code Family Communication: None at bedside  disposition:   Status is: Inpatient  Remains inpatient appropriate because:Ongoing diagnostic testing needed not appropriate for outpatient work up, IV treatments appropriate due to intensity of illness or inability to take PO and Inpatient level of care appropriate due to severity of illness   Dispo: The patient is from: Home              Anticipated d/c is to: Home              Patient currently is not medically stable to d/c.   Difficult to place patient No       Consultants:   None  Procedures: None Antimicrobials: None  Subjective: Patient seen and examined this morning.  He appears frustrated and reports that he needs to go to the bathroom very frequently.  Patient denies any chest pain but reports his breathing has not improved.  Objective: Vitals:   01/29/21 0500 01/29/21 0600 01/29/21 0740 01/29/21 1201  BP:    (!) 125/58  Pulse:    83  Resp:    20  Temp:    98.2 F (36.8 C)  TempSrc:    Oral  SpO2:   94% 94%  Weight: 104.2 kg 103.7 kg    Height:        Intake/Output Summary (Last 24 hours) at 01/29/2021 1519 Last data filed at 01/29/2021 1257 Gross per 24 hour  Intake 1200 ml  Output 3100 ml  Net -1900 ml   Filed Weights   01/28/21 0337 01/29/21 0500 01/29/21 0600  Weight: 111.8 kg 104.2 kg 103.7 kg    Examination:  General exam: Appears calm and comfortable  Respiratory system: Tachypnea, on 4 lit of [place] oxygen,diminished air entry at bases.  Cardiovascular system: S1 & S2 heard, RRR. Marland Kitchen Gastrointestinal system: Abdomen is nondistended, soft and nontender.Normal bowel sounds heard. Central nervous system: Alert and oriented. No focal neurological deficits. Extremities: right lower extremity swelling.  Skin: stage 2 pressure injury on the  back.  Psychiatry: Mood & affect appropriate.     Data Reviewed: I have personally reviewed following labs and imaging studies  CBC: Recent Labs  Lab 01/26/21 2142 01/27/21 0315 01/28/21 0420 01/29/21 0406  WBC 5.8 5.5  --  4.4  NEUTROABS 4.3 3.7  --   --   HGB 8.9* 8.2* 8.4* 8.1*  HCT 30.1* 26.8* 27.5* 26.5*  MCV 87.2 85.6  --  84.4  PLT 402* 376  --  643    Basic Metabolic Panel: Recent Labs  Lab 01/26/21 2142 01/26/21 2319 01/27/21 0315 01/28/21 0420 01/29/21 0406  NA 132* 136 137 139 141  K 6.2* 3.4* 3.4* 3.5 3.1*  CL 93* 95* 95* 94* 91*  CO2 _0 33* 39*  GLUCOSE 199* 139* 127* 225* 163*  BUN 72* 70* 70* 73* 78*  CREATININE 3.50* 3.42* 3.36* 3.26* 3.00*  CALCIUM 8.3* 8.5* 8.4* 9.0 9.1  MG  --   --  2.1 2.2 2.3  PHOS  --   --   --  6.2* 5.6*    GFR: Estimated Creatinine Clearance: 27.7 mL/min (A) (by C-G formula based on SCr of 3 mg/dL (H)).  Liver Function Tests: Recent Labs  Lab 01/26/21 2142 01/27/21 0315 01/28/21 0420 01/29/21 0406  AST 48* 19  --   --   ALT 18 12  --   --   ALKPHOS 104 88  --   --   BILITOT 1.2 0.3  --   --   PROT 6.9 6.5  --   --   ALBUMIN 2.8* 2.6* 2.6* 2.6*    CBG: Recent Labs  Lab 01/28/21 1203 01/28/21 1550 01/28/21 2134 01/29/21 0600 01/29/21 1157  GLUCAP 226* 147* 148* 143* 176*     Recent Results (from the past 240 hour(s))  Resp Panel by RT-PCR (Flu A&B, Covid) Nasopharyngeal Swab     Status: None   Collection Time: 01/26/21 11:53 PM   Specimen: Nasopharyngeal Swab; Nasopharyngeal(NP) swabs in vial transport medium  Result Value Ref Range Status   SARS Coronavirus 2 by RT PCR NEGATIVE NEGATIVE Final    Comment: (NOTE) SARS-CoV-2 target nucleic acids are NOT DETECTED.  The SARS-CoV-2 RNA is generally detectable in upper respiratory specimens during the acute phase of infection. The lowest concentration of SARS-CoV-2 viral copies this assay can detect is 138 copies/mL. A negative result does not  preclude SARS-Cov-2 infection and should not be used as the sole basis for treatment or other patient management decisions. A negative result may occur with  improper specimen collection/handling, submission of specimen other than nasopharyngeal swab, presence of viral mutation(s) within the areas targeted by this assay, and inadequate number of viral copies(<138 copies/mL). A negative result must be combined with clinical observations, patient history, and epidemiological information. The expected result is Negative.  Fact Sheet for Patients:  EntrepreneurPulse.com.au  Fact Sheet for Healthcare Providers:  IncredibleEmployment.be  This test is no t yet approved or cleared by the [place] FDA and  has been authorized for detection and/or diagnosis of SARS-CoV-2 by FDA under an Emergency Use Authorization (EUA). This EUA will remain  in effect (meaning this test can be used) for the duration of the COVID-19 declaration under Section 564(b)(1) of the Act, 21 U.S.C.section 360bbb-3(b)(1), unless the authorization is terminated  or revoked sooner.       Influenza A by PCR NEGATIVE NEGATIVE Final   Influenza B by PCR NEGATIVE NEGATIVE Final    Comment: (NOTE) The Xpert Xpress SARS-CoV-2/FLU/RSV plus assay is intended as an aid in the diagnosis of influenza from Nasopharyngeal swab specimens and should not be used as a sole basis for treatment. Nasal washings and aspirates are unacceptable for Xpert Xpress SARS-CoV-2/FLU/RSV testing.  Fact Sheet for Patients: EntrepreneurPulse.com.au  Fact Sheet for Healthcare Providers: IncredibleEmployment.be  This test is not yet approved or cleared by the [place] FDA and has been authorized for detection and/or diagnosis of SARS-CoV-2 by FDA under an Emergency Use Authorization (EUA). This EUA will remain in effect (meaning this test can be used) for the  duration of the COVID-19 declaration under Section 564(b)(1) of the Act, 21 U.S.C. section 360bbb-3(b)(1), unless the authorization is terminated or revoked.  Performed at [facility], [place] [address] [place], [place] [address]   MRSA PCR Screening     Status: None   Collection Time: 01/27/21  1:07 AM   Specimen: Nasal Mucosa; Nasopharyngeal  Result Value Ref Range Status   MRSA by PCR NEGATIVE NEGATIVE Final    Comment:  The GeneXpert MRSA Assay (FDA approved for NASAL specimens only), is one component of a comprehensive MRSA colonization surveillance program. It is not intended to diagnose MRSA infection nor to guide or monitor treatment for MRSA infections. Performed at [facility], [place] [address], [place], [place] [address]          Radiology Studies: No results found.      Scheduled Meds: . amiodarone  200 mg Oral Daily  . apixaban  5 mg Oral BID  . arformoterol  15 mcg Nebulization BID  . atorvastatin  40 mg Oral QHS  . budesonide (PULMICORT) nebulizer solution  0.5 mg Nebulization BID  . feeding supplement (GLUCERNA SHAKE)  237 mL Oral TID BM  . furosemide  60 mg Intravenous BID  . insulin aspart  0-15 Units Subcutaneous TID WC  . insulin aspart  0-5 Units Subcutaneous QHS  . insulin aspart  5 Units Subcutaneous TID WC  . insulin detemir  15 Units Subcutaneous BID  . ipratropium-albuterol  3 mL Nebulization BID  . latanoprost  1 drop Both Eyes QHS  . methylPREDNISolone (SOLU-MEDROL) injection  60 mg Intravenous Q12H  . metolazone  5 mg Oral Daily  . pantoprazole  40 mg Oral BID  . sodium chloride flush  3 mL Intravenous Q12H  . tamsulosin  0.4 mg Oral Daily  . umeclidinium bromide  1 puff Inhalation Daily   Continuous Infusions: . sodium chloride       LOS: 2 days        Hosie Poisson, MD [facility]   To contact the attending provider between 7A-7P or the covering provider during after hours 7P-7A, please log  into the web site [email or web address] and access using universal [place] password for that web site. If you do not have the password, please call the hospital operator.  01/29/2021, 3:19 PM

## 2021-01-29 NOTE — Consult Note (Signed)
   [facility] Inpatient Consult   01/29/2021  Pawel Soules 03-21-1946 [id number]  [facility] [ACO] Patient: [employer]; [place]  Patient is currently active with [place] [facility] for chronic disease management services.  Patient has been engaged by a [facility].  Our community based plan of care has focused on disease management and community resource support.  Reviewed electronic medical records of community care management engagement and notified [facility] RN CM of admission.  Patient will receive a post hospital call and will be evaluated for assessments and disease process education.    Plan: Went by to speak with patient however, he was in patient care.  Will continue to follow for post hospital needs.  Alerted Inpatient Transition Of Care [TOC] team member to make aware that [place] [facility] following in morning progression meeting. Of note, [facility] Care Management services does not replace or interfere with any services that are needed or arranged by inpatient [facility] care management team.  For additional questions or referrals please contact:  Natividad Brood, RN BSN [facility] Liaison  [phone] business mobile phone Toll free office [phone]  Fax number: [phone] [email or web address]

## 2021-01-30 ENCOUNTER — Inpatient Hospital Stay (HOSPITAL_COMMUNITY): Payer: No Typology Code available for payment source

## 2021-01-30 LAB — BASIC METABOLIC PANEL
Anion gap: 10 (ref 5–15)
BUN: 86 mg/dL — ABNORMAL HIGH (ref 8–23)
CO2: 40 mmol/L — ABNORMAL HIGH (ref 22–32)
Calcium: 9.1 mg/dL (ref 8.9–10.3)
Chloride: 90 mmol/L — ABNORMAL LOW (ref 98–111)
Creatinine, Ser: 3.03 mg/dL — ABNORMAL HIGH (ref 0.61–1.24)
GFR, Estimated: 21 mL/min — ABNORMAL LOW (ref >60)
Glucose, Bld: 153 mg/dL — ABNORMAL HIGH (ref 70–99)
Potassium: 3 mmol/L — ABNORMAL LOW (ref 3.5–5.1)
Sodium: 140 mmol/L (ref 135–145)

## 2021-01-30 LAB — GLUCOSE, CAPILLARY
Glucose-Capillary: 91 mg/dL (ref 70–99)
Glucose-Capillary: 148 mg/dL — ABNORMAL HIGH (ref 70–99)
Glucose-Capillary: 140 mg/dL — ABNORMAL HIGH (ref 70–99)

## 2021-01-30 IMAGING — CT CT ABD-PELV W/O CM
2 of 4 series · 16 of 46 positions shown, 18 images · non-contrast
Comparison: 01/02/2019

CLINICAL DATA: Abdominal distension with nausea and vomiting

EXAM:
CT ABDOMEN AND PELVIS WITHOUT CONTRAST
TECHNIQUE: Multidetector CT imaging of the abdomen and pelvis was performed
following the standard protocol without IV contrast.

[Series 3: ap without · axial · non-contrast · 0.90mm/px · z∈[+871,+1311]mm · 13 of 100 slices shown, 15 images]
[im 6/100  soft-tissue]
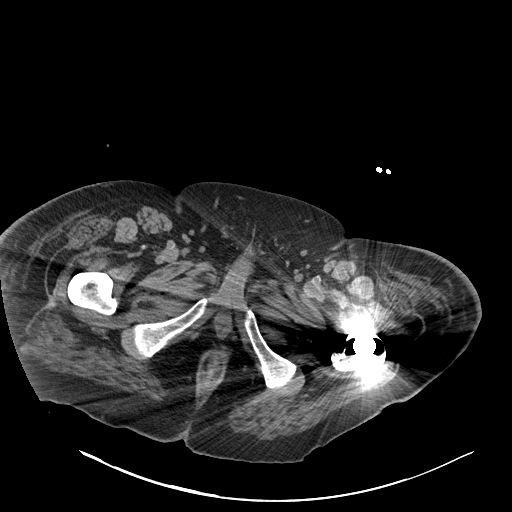
[im 6/100  bone]
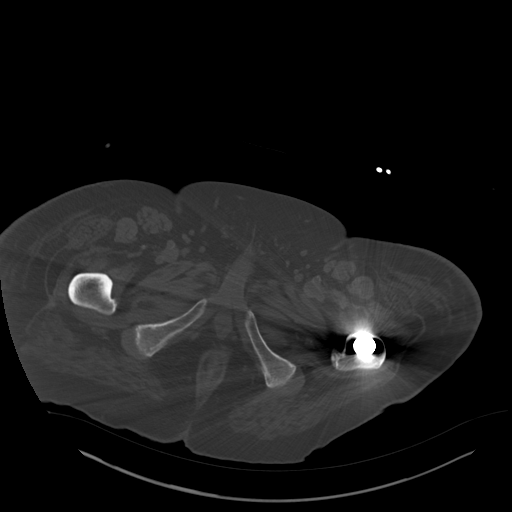
[im 12/100  soft-tissue]
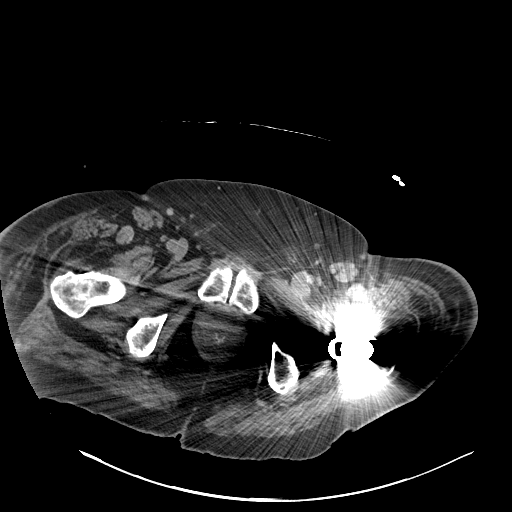
[im 24/100  soft-tissue]
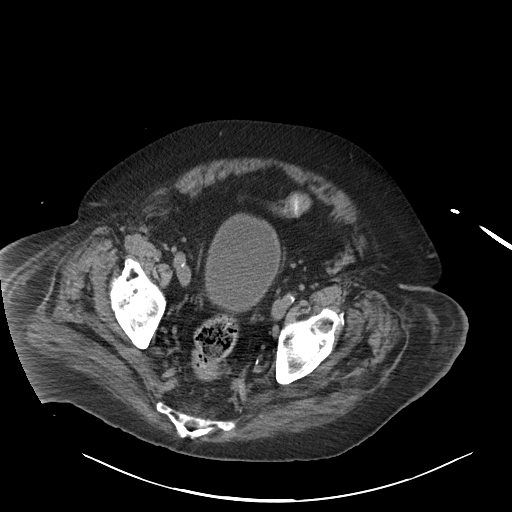
[im 30/100  soft-tissue]
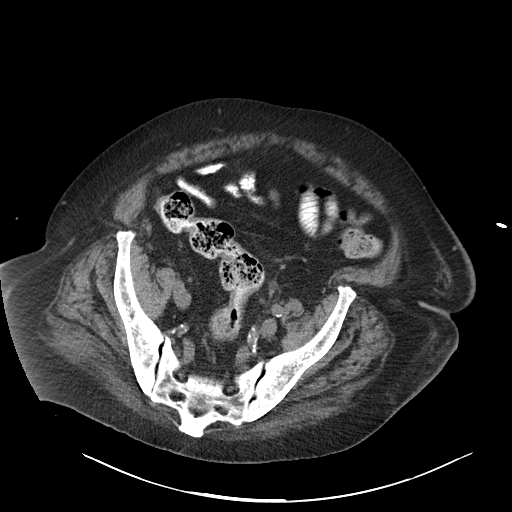
[im 35/100  soft-tissue]
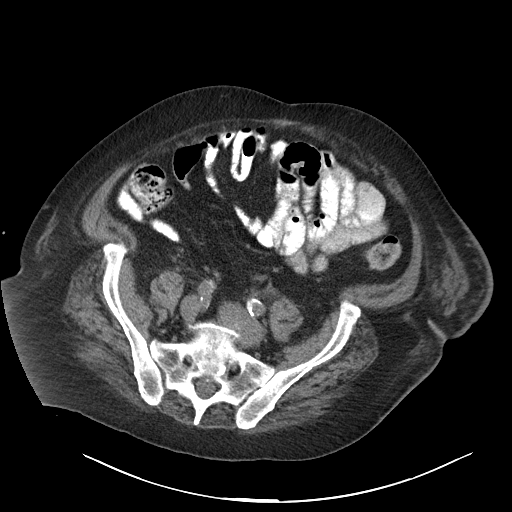
[im 41/100  soft-tissue]
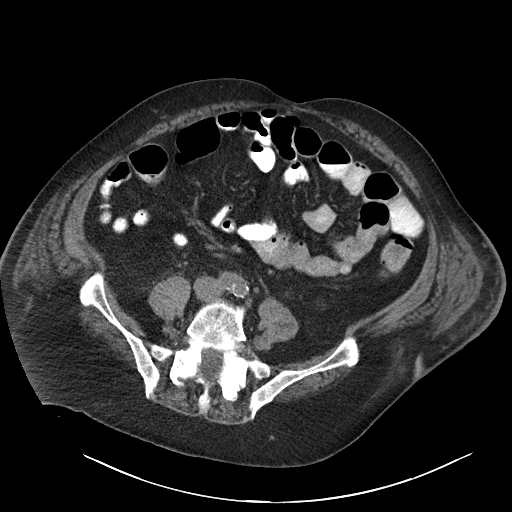
[im 53/100  soft-tissue]
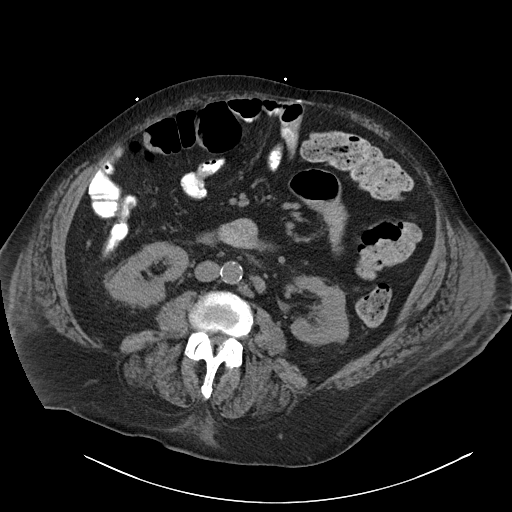
[im 59/100  soft-tissue]
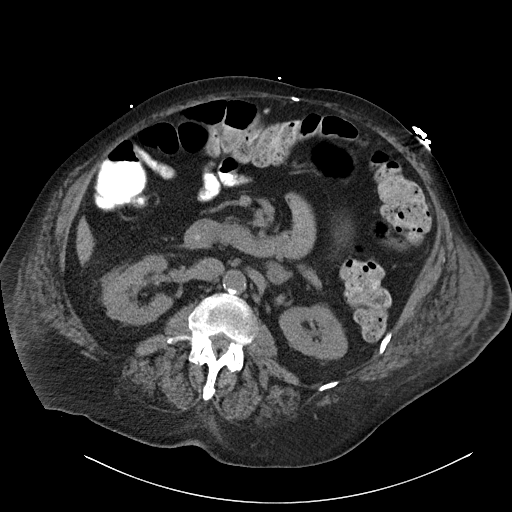
[im 65/100  soft-tissue]
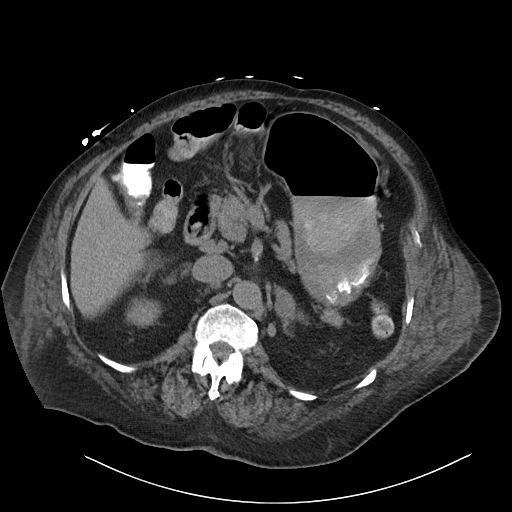
[im 65/100  bone]
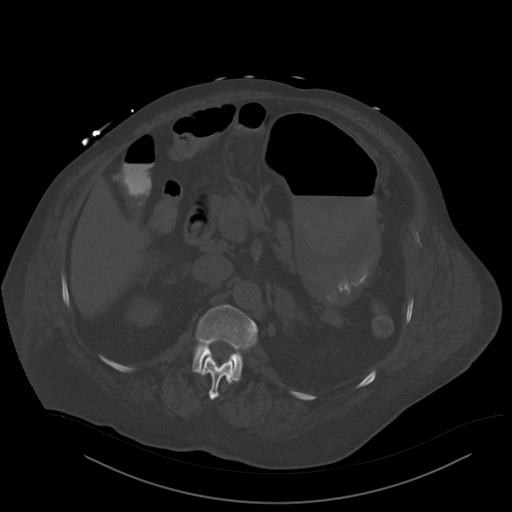
[im 70/100  soft-tissue]
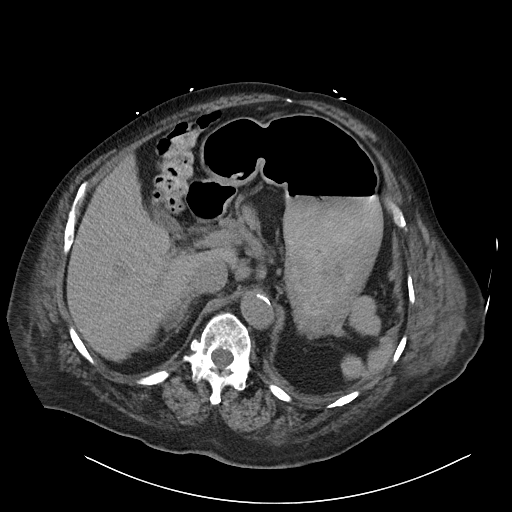
[im 76/100  soft-tissue]
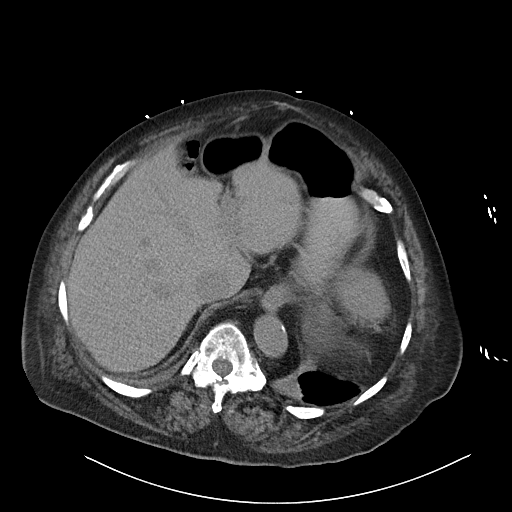
[im 88/100  soft-tissue]
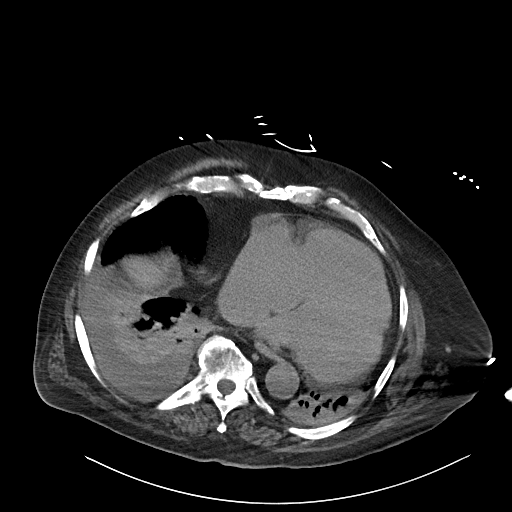
[im 94/100  soft-tissue]
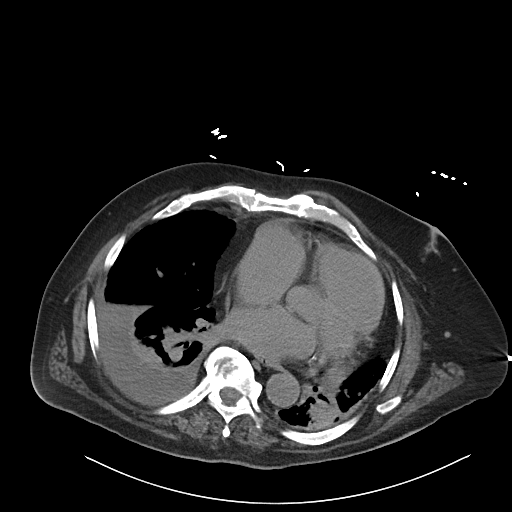

[Series 6: cor · coronal · 0.94mm/px · 3 of 119 slices shown]
[im 40/119  soft-tissue]
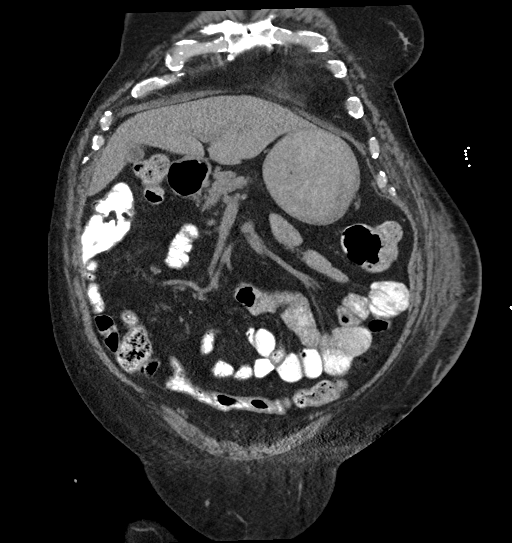
[im 53/119  soft-tissue]
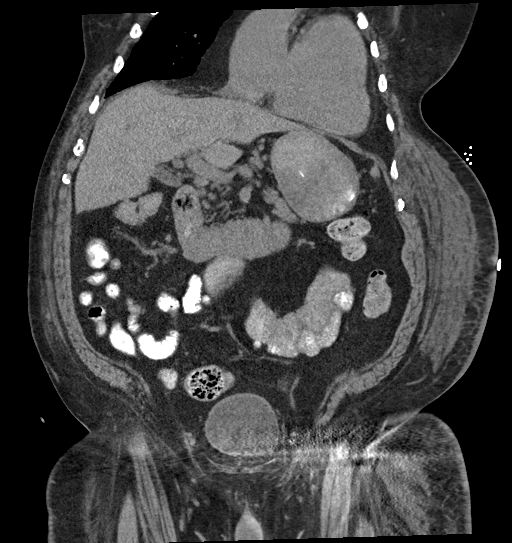
[im 66/119  soft-tissue]
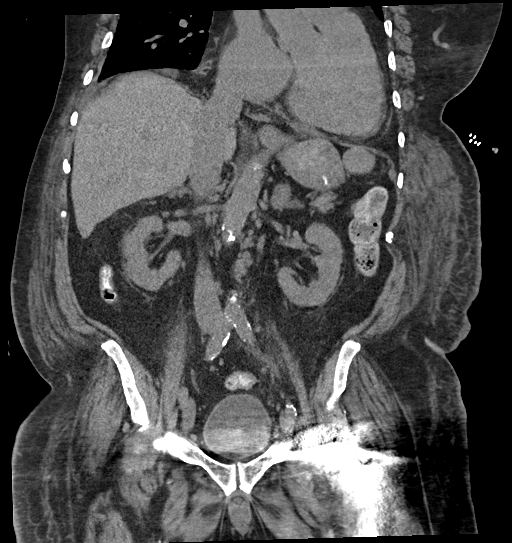

[16 of 46 positions shown; findings below may reference images not displayed]

FINDINGS: Lower chest: Right-sided pleural effusion is noted of a moderate
size. Bilateral lower lobe infiltrates are seen.

Hepatobiliary: No focal liver abnormality is seen. No gallstones,
gallbladder wall thickening, or biliary dilatation.

Pancreas: Unremarkable. No pancreatic ductal dilatation or
surrounding inflammatory changes.

Spleen: Multiple small splenules are noted.

Adrenals/Urinary Tract: Right adrenal gland is within normal limits.
There is a left adrenal lesion stable from prior exam measuring 2 cm
consistent with an adenoma. Kidneys are well visualized bilaterally.
No renal calculi or obstructive changes are seen. Ureters are
unremarkable. The bladder is partially distended.

Stomach/Bowel: The appendix is within normal limits. No obstructive
or inflammatory changes of the colon are seen. Small bowel and
stomach within normal limits.

Vascular/Lymphatic: Aortic atherosclerosis. No enlarged abdominal or
pelvic lymph nodes.

Reproductive: Prostate is unremarkable.

Other: No abdominal wall hernia or abnormality. No abdominopelvic
ascites.

Musculoskeletal: Degenerative changes of the lumbar spine are seen.
Left hip replacement is noted. Old healed rib fractures are noted.
IMPRESSION: Bilateral lower lobe infiltrates with right-sided pleural effusion.
Changes have increased somewhat in the interval from the prior exam.

Stable left adrenal lesion likely representing an adenoma.

Chronic changes as described above.

## 2021-01-30 MED ORDER — POTASSIUM CHLORIDE CRYS ER 20 MEQ PO TBCR
40.0000 meq | EXTENDED_RELEASE_TABLET | Freq: Three times a day (TID) | ORAL | Status: AC
  Administered 2021-01-30 – 2021-01-31 (×6): 40 meq via ORAL
  Filled 2021-01-30 (×6): qty 2

## 2021-01-30 MED ORDER — FUROSEMIDE 10 MG/ML IJ SOLN
6.0000 mg/h | INTRAVENOUS | Status: DC
  Administered 2021-01-30 – 2021-02-02 (×3): 6 mg/h via INTRAVENOUS
  Filled 2021-01-30 (×3): qty 20

## 2021-01-30 MED ORDER — GUAIFENESIN ER 600 MG PO TB12
600.0000 mg | ORAL_TABLET | Freq: Two times a day (BID) | ORAL | Status: DC
  Administered 2021-01-30 – 2021-02-03 (×8): 600 mg via ORAL
  Filled 2021-01-30 (×8): qty 1

## 2021-01-30 NOTE — Progress Notes (Signed)
PROGRESS NOTE    Nicholas Caldwell  ZOX:[id number] DOB: 02-28-1946 DOA: 01/26/2021 PCP: [name], MD    Brief Narrative:  75 year old gentleman with history of combined congestive heart failure, COPD, obstructive sleep apnea on CPAP at home, chronic hypoxemic respiratory failure on 3 L oxygen, pulmonary arterial hypertension, paroxysmal atrial fibrillation, type 2 diabetes on insulin, CKD stage IIIb with baseline creatinine about 2-2.2, chronic pain syndrome, hypertension who presented to the emergency room with shortness of breath and generalized weakness, PND and orthopnea.  In the emergency room, hemodynamically stable.  Complaining of shortness of breath.  Found to have fluid overload in the hospital.   Assessment & Plan:   Principal Problem:   Acute on chronic combined systolic and diastolic CHF (congestive heart failure) (HCC) Active Problems:   Essential hypertension   COPD with acute exacerbation (HCC)   Lactic acidosis   PAF (paroxysmal atrial fibrillation) (HCC)   Coronary artery disease involving native coronary artery of native heart   Acute on chronic respiratory failure with hypoxia (HCC)   OSA (obstructive sleep apnea)   Mixed diabetic hyperlipidemia associated with type 2 diabetes mellitus (HCC)   Pressure ulcer of left buttock   Acute renal failure superimposed on stage 3b chronic kidney disease ([place])   Acute cardiogenic pulmonary edema (HCC)   Uncontrolled type 2 diabetes mellitus with hyperglycemia, with long-term current use of insulin (HCC)   Leg weakness, bilateral  Acute on chronic hypoxemic respiratory failure secondary to acute on chronic combined heart failure. Admitted with fluid overload, orthopnea and edema.  Chest x-ray with pulmonary edema with small right pleural effusion. Recent echocardiogram on 2/22 with ejection fraction 45 to 50% with global hypokinesis, severely reduced right ventricular systolic function and enlarged right ventricle.  On  torsemide 80 mg twice a day at home. Patient has been on Lasix 60 mg 2 times a day IV, -5 L balance.  Still symptomatic.  With his poor renal function, he will benefit with Lasix infusion.  We will start with Lasix 6 mg/h continuous infusion.  Intake and output monitoring.  Daily weight.  No indication to repeat echocardiogram.  suspected acute COPD exacerbation: Patient on bronchodilators.  He was on high-dose steroids, currently not wheezing.  Will discontinue steroids especially that makes him eligible and was declined to take it.  Obstructive sleep apnea on BiPAP: He is noncompliant at home.  Currently using home machine.  Acute kidney injury on chronic kidney disease stage IIIb/hyperkalemia due to diuresis: Exacerbated due to #1.  Reported baseline creatinine about 2.2.  We will keep patient on potassium supplements. Monitor on IV diuresis.  Paroxysmal A. fib: Rate controlled and currently in sinus rhythm.  Status post DCCV on 01/10/2021.  Therapeutic on anticoagulation with Eliquis.  Replace potassium aggressively.  Type 2 diabetes with hyperglycemia: Continue insulin.  Today blood sugars are stable.  Anemia of chronic kidney disease: Hemoglobin is about 8.  Fairly stable.  Pressure injury, buttocks left stage II: Present on admission.  Barrier dressing.    DVT prophylaxis: Place and maintain sequential compression device Start: 01/27/21 1533 apixaban (ELIQUIS) tablet 5 mg   Code Status: Full code Family Communication: None.  Attempted to call patient's wife and she could not pick up her phone. Disposition Plan: Status is: Inpatient  Remains inpatient appropriate because:IV treatments appropriate due to intensity of illness or inability to take PO and Inpatient level of care appropriate due to severity of illness   Dispo: The patient is from: Home  Anticipated d/c is to: SNF              Patient currently is not medically stable to d/c.   Difficult to place patient  No         Consultants:   None, will consult palliative  Procedures:   None  Antimicrobials:   None   Subjective: Patient seen and examined.  Today, he complains of congestion and tightness of the chest.  Had some dry cough but no sputum.  Afebrile.  Used BiPAP at night. He declined to take steroids that makes him irritable. We discussed about his treatment with nebulizers and diuresis, he was somewhat frustrated as he was not getting good results right away.  Objective: Vitals:   01/30/21 0526 01/30/21 0756 01/30/21 0825 01/30/21 1141  BP: (!) 100/54  (!) 108/49 110/65  Pulse: 62  79 72  Resp: 18     Temp:   98.2 F (36.8 C) 98.5 F (36.9 C)  TempSrc:   Oral Oral  SpO2: 92% 98% 93% 92%  Weight: 103.8 kg     Height:        Intake/Output Summary (Last 24 hours) at 01/30/2021 1440 Last data filed at 01/30/2021 0824 Gross per 24 hour  Intake 963 ml  Output 1750 ml  Net -787 ml   Filed Weights   01/29/21 0500 01/29/21 0600 01/30/21 0526  Weight: 104.2 kg 103.7 kg 103.8 kg    Examination:  General exam: Appears anxious.  Mild distress on talking and coughing. Respiratory system: Mostly conducted airway sounds.  Poor bilateral air entry. Cardiovascular system: S1 & S2 heard, RRR.  No pedal edema. Gastrointestinal system: Soft.  Distended but nontender. Central nervous system: Alert and oriented. No focal neurological deficits. Extremities: Symmetric 5 x 5 power. Alert oriented x4.  Flat affect and anxious mood.    Data Reviewed: I have personally reviewed following labs and imaging studies  CBC: Recent Labs  Lab 01/26/21 2142 01/27/21 0315 01/28/21 0420 01/29/21 0406  WBC 5.8 5.5  --  4.4  NEUTROABS 4.3 3.7  --   --   HGB 8.9* 8.2* 8.4* 8.1*  HCT 30.1* 26.8* 27.5* 26.5*  MCV 87.2 85.6  --  84.4  PLT 402* 376  --  484   Basic Metabolic Panel: Recent Labs  Lab 01/26/21 2319 01/27/21 0315 01/28/21 0420 01/29/21 0406 01/30/21 0917  NA 136  137 139 141 140  K 3.4* 3.4* 3.5 3.1* 3.0*  CL 95* 95* 94* 91* 90*  CO2 31 31 33* 39* 40*  GLUCOSE 139* 127* 225* 163* 153*  BUN 70* 70* 73* 78* 86*  CREATININE 3.42* 3.36* 3.26* 3.00* 3.03*  CALCIUM 8.5* 8.4* 9.0 9.1 9.1  MG  --  2.1 2.2 2.3  --   PHOS  --   --  6.2* 5.6*  --    GFR: Estimated Creatinine Clearance: 27.5 mL/min (A) (by C-G formula based on SCr of 3.03 mg/dL (H)). Liver Function Tests: Recent Labs  Lab 01/26/21 2142 01/27/21 0315 01/28/21 0420 01/29/21 0406  AST 48* 19  --   --   ALT 18 12  --   --   ALKPHOS 104 88  --   --   BILITOT 1.2 0.3  --   --   PROT 6.9 6.5  --   --   ALBUMIN 2.8* 2.6* 2.6* 2.6*   No results for input(s): LIPASE, AMYLASE in the last 168 hours. No results for input(s): AMMONIA in the  last 168 hours. Coagulation Profile: No results for input(s): INR, PROTIME in the last 168 hours. Cardiac Enzymes: No results for input(s): CKTOTAL, CKMB, CKMBINDEX, TROPONINI in the last 168 hours. BNP (last 3 results) No results for input(s): PROBNP in the last 8760 hours. HbA1C: Recent Labs    01/28/21 0420  HGBA1C 6.8*   CBG: Recent Labs  Lab 01/29/21 0600 01/29/21 1157 01/29/21 1618 01/30/21 0601 01/30/21 1140  GLUCAP 143* 176* 132* 91 148*   Lipid Profile: No results for input(s): CHOL, HDL, LDLCALC, TRIG, CHOLHDL, LDLDIRECT in the last 72 hours. Thyroid Function Tests: No results for input(s): TSH, T4TOTAL, FREET4, T3FREE, THYROIDAB in the last 72 hours. Anemia Panel: No results for input(s): VITAMINB12, FOLATE, FERRITIN, TIBC, IRON, RETICCTPCT in the last 72 hours. Sepsis Labs: Recent Labs  Lab 01/26/21 2142 01/26/21 2319 01/27/21 0315  PROCALCITON  --   --  0.50  LATICACIDVEN 2.3* 1.5  --     Recent Results (from the past 240 hour(s))  Resp Panel by RT-PCR (Flu A&B, Covid) Nasopharyngeal Swab     Status: None   Collection Time: 01/26/21 11:53 PM   Specimen: Nasopharyngeal Swab; Nasopharyngeal(NP) swabs in vial transport  medium  Result Value Ref Range Status   SARS Coronavirus 2 by RT PCR NEGATIVE NEGATIVE Final    Comment: (NOTE) SARS-CoV-2 target nucleic acids are NOT DETECTED.  The SARS-CoV-2 RNA is generally detectable in upper respiratory specimens during the acute phase of infection. The lowest concentration of SARS-CoV-2 viral copies this assay can detect is 138 copies/mL. A negative result does not preclude SARS-Cov-2 infection and should not be used as the sole basis for treatment or other patient management decisions. A negative result may occur with  improper specimen collection/handling, submission of specimen other than nasopharyngeal swab, presence of viral mutation(s) within the areas targeted by this assay, and inadequate number of viral copies(<138 copies/mL). A negative result must be combined with clinical observations, patient history, and epidemiological information. The expected result is Negative.  Fact Sheet for Patients:  EntrepreneurPulse.com.au  Fact Sheet for Healthcare Providers:  IncredibleEmployment.be  This test is no t yet approved or cleared by the [place] FDA and  has been authorized for detection and/or diagnosis of SARS-CoV-2 by FDA under an Emergency Use Authorization (EUA). This EUA will remain  in effect (meaning this test can be used) for the duration of the COVID-19 declaration under Section 564(b)(1) of the Act, 21 U.S.C.section 360bbb-3(b)(1), unless the authorization is terminated  or revoked sooner.       Influenza A by PCR NEGATIVE NEGATIVE Final   Influenza B by PCR NEGATIVE NEGATIVE Final    Comment: (NOTE) The Xpert Xpress SARS-CoV-2/FLU/RSV plus assay is intended as an aid in the diagnosis of influenza from Nasopharyngeal swab specimens and should not be used as a sole basis for treatment. Nasal washings and aspirates are unacceptable for Xpert Xpress SARS-CoV-2/FLU/RSV testing.  Fact Sheet for  Patients: EntrepreneurPulse.com.au  Fact Sheet for Healthcare Providers: IncredibleEmployment.be  This test is not yet approved or cleared by the [place] FDA and has been authorized for detection and/or diagnosis of SARS-CoV-2 by FDA under an Emergency Use Authorization (EUA). This EUA will remain in effect (meaning this test can be used) for the duration of the COVID-19 declaration under Section 564(b)(1) of the Act, 21 U.S.C. section 360bbb-3(b)(1), unless the authorization is terminated or revoked.  Performed at [facility], [place] [address], [place], [place] [address]   MRSA PCR  Screening     Status: None   Collection Time: 01/27/21  1:07 AM   Specimen: Nasal Mucosa; Nasopharyngeal  Result Value Ref Range Status   MRSA by PCR NEGATIVE NEGATIVE Final    Comment:        The GeneXpert MRSA Assay (FDA approved for NASAL specimens only), is one component of a comprehensive MRSA colonization surveillance program. It is not intended to diagnose MRSA infection nor to guide or monitor treatment for MRSA infections. Performed at [facility], [place] [address] [place], [place] [address]          Radiology Studies: DG CHEST PORT 1 VIEW  Result Date: 01/30/2021 CLINICAL DATA:  Shortness of breath EXAM: PORTABLE CHEST 1 VIEW COMPARISON:  January 26, 2021 FINDINGS: There is cardiomegaly with pulmonary venous hypertension. There is airspace opacity in the left lower lobe with associated left pleural effusion. There is a degree of interstitial pulmonary edema, slightly less than on most recent study. No adenopathy evident. There is aortic atherosclerosis. No bone lesions. IMPRESSION: Cardiomegaly with pulmonary vascular congestion. Overall less interstitial edema compared to wrist recent study. Airspace opacity left lower lobe with small left pleural effusion. Suspect combination of atelectasis and potential superimposed pneumonia. No new  opacity compared to most recent study. Aortic Atherosclerosis (ICD10-I70.0). Electronically Signed   By: Lowella Grip III M.D.   On: 01/30/2021 11:03        Scheduled Meds: . amiodarone  200 mg Oral Daily  . apixaban  5 mg Oral BID  . arformoterol  15 mcg Nebulization BID  . atorvastatin  40 mg Oral QHS  . budesonide (PULMICORT) nebulizer solution  0.5 mg Nebulization BID  . feeding supplement (GLUCERNA SHAKE)  237 mL Oral TID BM  . insulin aspart  0-15 Units Subcutaneous TID WC  . insulin aspart  0-5 Units Subcutaneous QHS  . insulin aspart  5 Units Subcutaneous TID WC  . insulin detemir  15 Units Subcutaneous BID  . ipratropium-albuterol  3 mL Nebulization BID  . latanoprost  1 drop Both Eyes QHS  . metolazone  5 mg Oral Daily  . pantoprazole  40 mg Oral BID  . potassium chloride  40 mEq Oral TID  . sodium chloride flush  3 mL Intravenous Q12H  . tamsulosin  0.4 mg Oral Daily  . umeclidinium bromide  1 puff Inhalation Daily   Continuous Infusions: . sodium chloride    . furosemide (LASIX) 200 mg in dextrose 5% 100 mL (6m/mL) infusion       LOS: 3 days    Time spent: 34 minutes     KBarb Merino MD [facility] Pager [phone]

## 2021-01-30 NOTE — Progress Notes (Signed)
TRH night shift.  The nursing staff notified me that the patient is requesting Mucinex, which he takes for cough.  Mucinex 600 mg p.o. twice daily ordered.  Tennis Must, MD

## 2021-01-30 NOTE — Progress Notes (Signed)
PT Cancellation Note  Patient Details Name: Donavan Kerlin MRN: [id number] DOB: May 10, 1946   Cancelled Treatment:    Reason Eval/Treat Not Completed: Other (comment).  Pt has been unable to do therapy, waiting for nursing to work with him and declined to have PT see him.  Follow up as time and pt allow.   Ramond Dial 01/30/2021, 4:54 PM Mee Hives, PT MS Acute Rehab Dept. Number: [place] and [place]

## 2021-01-30 NOTE — Progress Notes (Signed)
Patient has home BIPAP machine. Pt stated he will place himself on without assistance when he is ready. RT instructed patient to have RT called if assistance is needed. RT will monitor as needed.

## 2021-01-31 ENCOUNTER — Encounter (HOSPITAL_COMMUNITY): Payer: No Typology Code available for payment source

## 2021-01-31 DIAGNOSIS — R0602 Shortness of breath: Secondary | ICD-10-CM

## 2021-01-31 DIAGNOSIS — Z7189 Other specified counseling: Secondary | ICD-10-CM

## 2021-01-31 DIAGNOSIS — Z515 Encounter for palliative care: Secondary | ICD-10-CM

## 2021-01-31 DIAGNOSIS — I5043 Acute on chronic combined systolic (congestive) and diastolic (congestive) heart failure: Secondary | ICD-10-CM | POA: Diagnosis not present

## 2021-01-31 LAB — GLUCOSE, CAPILLARY
Glucose-Capillary: 130 mg/dL — ABNORMAL HIGH (ref 70–99)
Glucose-Capillary: 112 mg/dL — ABNORMAL HIGH (ref 70–99)
Glucose-Capillary: 73 mg/dL (ref 70–99)
Glucose-Capillary: 179 mg/dL — ABNORMAL HIGH (ref 70–99)

## 2021-01-31 LAB — BASIC METABOLIC PANEL
Anion gap: 9 (ref 5–15)
BUN: 82 mg/dL — ABNORMAL HIGH (ref 8–23)
CO2: 44 mmol/L — ABNORMAL HIGH (ref 22–32)
Calcium: 9.5 mg/dL (ref 8.9–10.3)
Chloride: 89 mmol/L — ABNORMAL LOW (ref 98–111)
Creatinine, Ser: 3.04 mg/dL — ABNORMAL HIGH (ref 0.61–1.24)
GFR, Estimated: 21 mL/min — ABNORMAL LOW (ref >60)
Glucose, Bld: 138 mg/dL — ABNORMAL HIGH (ref 70–99)
Potassium: 3.3 mmol/L — ABNORMAL LOW (ref 3.5–5.1)
Sodium: 142 mmol/L (ref 135–145)

## 2021-01-31 MED ORDER — AMIODARONE HCL 200 MG PO TABS
200.0000 mg | ORAL_TABLET | Freq: Every day | ORAL | Status: DC
  Administered 2021-02-01 – 2021-02-03 (×3): 200 mg via ORAL
  Filled 2021-01-31 (×3): qty 1

## 2021-01-31 NOTE — Progress Notes (Signed)
Physical Therapy Treatment Patient Details Name: Nicholas Caldwell MRN: [id number] DOB: 05-29-1946 Today's Date: 01/31/2021    History of Present Illness Pt is a 75 y/o male admitted 01/26/21 with worsening BLE edema, SOB; pt with progressive BLE weakness with some R foot drop. Workup for CHF exacerbation. PMH includes afib, CAD, CHF, COPD, DM, obesity, NICM, HTN, pulmonary HTN, BPH, HLD.   PT Comments    Pt slowly progressing with mobility. Tolerated brief bouts of seated and standing activity with RW and min guard for balance; pt reports limited by pain, fatigue, nausea and SOB, unable to explain symptoms further when asked clarifying questions. Pt requires encouragement to perform mobility/ADLs as independently as possible; pt dependent for posterior hygiene, reports wife does this at home. Will continue to follow acutely.  SpO2 89-92% on 3L O2 [place]    Follow Up Recommendations  Home health PT;Supervision/Assistance - 24 hour     Equipment Recommendations  None recommended by PT    Recommendations for Other Services       Precautions / Restrictions Precautions Precautions: Fall;Other (comment) Precaution Comments: Bladder/bowel incontinence Restrictions Weight Bearing Restrictions: No    Mobility  Bed Mobility Overal bed mobility: Needs Assistance Bed Mobility: Supine to Sit     Supine to sit: Supervision;HOB elevated     General bed mobility comments: Increased time and effort, use of bed rail; increased time to recover from fatigue/SOB once seated EOB    Transfers Overall transfer level: Needs assistance Equipment used: Rolling walker (2 wheeled) Transfers: Sit to/from Stand Sit to Stand: Min guard;From elevated surface         General transfer comment: Pt requesting elevated bed height despite encouragement to practice with low height; multiple sit<>stands from EOB to recliner with min guard, prolonged seated rest to recover between  trials  Ambulation/Gait Ambulation/Gait assistance: Min guard Gait Distance (Feet): 2 Feet Assistive device: Rolling walker (2 wheeled) Gait Pattern/deviations: Step-through pattern;Decreased stride length;Trunk flexed Gait velocity: Decreased   General Gait Details: Slow, fatigued, mildly unsteady gait from bed to recliner with RW and min guard; pt declining further mobility secondary to fatigue and SOB; SpO2 89-92% on 3L O2 [place]   Stairs             Wheelchair Mobility    Modified Rankin (Stroke Patients Only)       Balance Overall balance assessment: Needs assistance Sitting-balance support: No upper extremity supported Sitting balance-Leahy Scale: Fair     Standing balance support: Bilateral upper extremity supported;During functional activity Standing balance-Leahy Scale: Poor Standing balance comment: Reliant on UE support; dependent for posterior pericare (pt not interested in attempting to perform posterior hygiene himself)                            Cognition Arousal/Alertness: Awake/alert Behavior During Therapy: Flat affect Overall Cognitive Status: History of cognitive impairments - at baseline                                 General Comments: Pt with poor insight into deficits; not receptive to encouragement or education on need to attempt to perform tasks as independently as possible      Exercises      General Comments        Pertinent Vitals/Pain Pain Assessment: Faces Faces Pain Scale: Hurts little more Pain Location: R-side lower back into R hip Pain Descriptors /  Indicators: Discomfort;Constant Pain Intervention(s): Monitored during session    Home Living                      Prior Function            PT Goals (current goals can now be found in the care plan section) Progress towards PT goals: Progressing toward goals (slowly; self-limiting, fatigue)    Frequency    Min 3X/week      PT  Plan Current plan remains appropriate    Co-evaluation              AM-PAC PT "6 Clicks" Mobility   Outcome Measure  Help needed turning from your back to your side while in a flat bed without using bedrails?: None Help needed moving from lying on your back to sitting on the side of a flat bed without using bedrails?: A Little Help needed moving to and from a bed to a chair (including a wheelchair)?: A Little Help needed standing up from a chair using your arms (e.g., wheelchair or bedside chair)?: A Little Help needed to walk in hospital room?: A Little Help needed climbing 3-5 steps with a railing? : A Lot 6 Click Score: 18    End of Session Equipment Utilized During Treatment: Oxygen Activity Tolerance: Patient limited by fatigue Patient left: in chair;with call bell/phone within reach;with chair alarm set Nurse Communication: Mobility status PT Visit Diagnosis: Unsteadiness on feet (R26.81);Muscle weakness (generalized) (M62.81);Difficulty in walking, not elsewhere classified (R26.2)     Time: 4158-3094 PT Time Calculation (min) (ACUTE ONLY): 25 min  Charges:  $Therapeutic Activity: 23-37 mins                     Mabeline Caras, PT, DPT Acute Rehabilitation Services  Pager [phone] Office [place] 01/31/2021, 4:52 PM

## 2021-01-31 NOTE — TOC Progression Note (Signed)
Transition of Care [facility]) - Progression Note    Patient Details  Name: Nicholas Caldwell MRN: [id number] Date of Birth: 12-10-1945  Transition of [facility]) CM/SW Contact  Zenon Mayo, RN Phone Number: 01/31/2021, 4:52 PM  Clinical Narrative:    NCM spoke with patient and wife at bedside, they would like to continue with [facility] which he is active with and would like ambulance transport home, NCM informed them that ptar is very busy on Friday.         Expected Discharge Plan and Services                                                 Social Determinants of Health (SDOH) Interventions    Readmission Risk Interventions Readmission Risk Prevention Plan 01/09/2021 11/27/2020 11/23/2020  Transportation Screening Complete - Complete  PCP or Specialist Appt within 3-5 Days - Complete -  HRI or [place] - Complete -  Social Work Consult for [place] Planning/Counseling - Complete -  Palliative Care Screening - Not Applicable -  Medication Review Press photographer) Complete Complete Complete  Med Review Comments - - -  PCP or Specialist appointment within 3-5 days of discharge - - -  PCP/Specialist Appt Not Complete comments - - -  [place] or Home Care Consult Complete - Complete  SW Recovery Care/Counseling Consult Complete - Complete  Palliative Care Screening Not Applicable - Not [place] Patient Refused - Not Applicable  Some recent data might be hidden

## 2021-01-31 NOTE — TOC Progression Note (Signed)
Transition of Care [facility]) - Progression Note    Patient Details  Name: Nicholas Caldwell MRN: [id number] Date of Birth: 03-21-1946  [facility]) CM/SW Contact  Zenon Mayo, RN Phone Number: 01/31/2021, 8:49 AM  Clinical Narrative:    NCM received call from [facility] rep stating PCP at [facility] is Dr . Marjo Bicker fax [phone]. Patient is 100% connected with the [place].  Goes to [place], CSW is [employer]. He has a Human resources officer at home.         Expected Discharge Plan and Services                                                 Social Determinants of Health (SDOH) Interventions    Readmission Risk Interventions Readmission Risk Prevention Plan 01/09/2021 11/27/2020 11/23/2020  Transportation Screening Complete - Complete  PCP or Specialist Appt within 3-5 Days - Complete -  HRI or [place] - Complete -  Social Work Consult for [place] Planning/Counseling - Complete -  Palliative Care Screening - Not Applicable -  Medication Review Press photographer) Complete Complete Complete  Med Review Comments - - -  PCP or Specialist appointment within 3-5 days of discharge - - -  PCP/Specialist Appt Not Complete comments - - -  [place] or Home Care Consult Complete - Complete  SW Recovery Care/Counseling Consult Complete - Complete  Palliative Care Screening Not Applicable - Not [place] Patient Refused - Not Applicable  Some recent data might be hidden

## 2021-01-31 NOTE — Progress Notes (Signed)
PROGRESS NOTE    Nicholas Caldwell  KYH:[id number] DOB: 08-Aug-1946 DOA: 01/26/2021 PCP: Nicholas Milan, MD    Brief Narrative:  75 year old gentleman with history of combined congestive heart failure, COPD, obstructive sleep apnea on CPAP at home, chronic hypoxemic respiratory failure on 3 L oxygen, pulmonary arterial hypertension, paroxysmal atrial fibrillation, type 2 diabetes on insulin, CKD stage IIIb with baseline creatinine about 2-2.2, chronic pain syndrome, hypertension who presented to the emergency room with shortness of breath and generalized weakness, PND and orthopnea.  In the emergency room, hemodynamically stable.  Complaining of shortness of breath.  Found to have fluid overload and admitted to the hospital.   Assessment & Plan:   Principal Problem:   Acute on chronic combined systolic and diastolic CHF (congestive heart failure) (HCC) Active Problems:   Essential hypertension   COPD with acute exacerbation (HCC)   Lactic acidosis   PAF (paroxysmal atrial fibrillation) (HCC)   Coronary artery disease involving native coronary artery of native heart   Acute on chronic respiratory failure with hypoxia (HCC)   OSA (obstructive sleep apnea)   Mixed diabetic hyperlipidemia associated with type 2 diabetes mellitus (HCC)   Pressure ulcer of left buttock   Acute renal failure superimposed on stage 3b chronic kidney disease ([place])   Acute cardiogenic pulmonary edema (HCC)   Uncontrolled type 2 diabetes mellitus with hyperglycemia, with long-term current use of insulin (HCC)   Leg weakness, bilateral  Acute on chronic hypoxemic respiratory failure secondary to acute on chronic combined heart failure. Admitted with fluid overload, orthopnea and edema.  Chest x-ray with pulmonary edema with small right pleural effusion. Recent echocardiogram on 2/22 with ejection fraction 45 to 50% with global hypokinesis, severely reduced right ventricular systolic function and enlarged right  ventricle.  On torsemide 80 mg twice a day at home. Currently on Lasix 6 mg/h, urine output 1500 mL last 24 hours.   Peripheral edema improved.  Still complains of being congested and shortness of breath. Will consult heart failure team, followed as outpatient.  suspected acute COPD exacerbation: Patient on bronchodilators.  He was on high-dose steroids, currently not wheezing.  Steroids discontinued.  Obstructive sleep apnea on BiPAP: He is noncompliant at home.  Currently using home machine.  Acute kidney injury on chronic kidney disease stage IIIb/hyperkalemia due to diuresis: Exacerbated due to #1.  Reported baseline creatinine about 2.2.  We will keep patient on potassium supplements. Monitor on IV diuresis.  Paroxysmal A. fib: Rate controlled and currently in sinus rhythm.  Status post DCCV on 01/10/2021.  Therapeutic on anticoagulation with Eliquis.  Replace potassium aggressively.  Type 2 diabetes with hyperglycemia: Continue insulin.  Blood sugar stable.  Anemia of chronic kidney disease: Hemoglobin is about 8.  Fairly stable.  Pressure injury, buttocks left stage II: Present on admission.  Barrier dressing.    DVT prophylaxis: Place and maintain sequential compression device Start: 01/27/21 1533 apixaban (ELIQUIS) tablet 5 mg   Code Status: Full code Family Communication: Daughter Nicholas Caldwell on the phone. Disposition Plan: Status is: Inpatient  Remains inpatient appropriate because:IV treatments appropriate due to intensity of illness or inability to take PO and Inpatient level of care appropriate due to severity of illness   Dispo: The patient is from: Home              Anticipated d/c is to: SNF              Patient currently is not medically stable to d/c.   Difficult to  place patient No         Consultants:   Palliative  Cardiology, heart failure  Procedures:   None  Antimicrobials:   None   Subjective: Patient seen and examined.  He is somehow  challenging to get history because he does not want to engage in conversation.  He repeatedly asked me to let him talk to his cardiology. His leg swelling has improved.  He looks comfortable on 3 L oxygen currently.  Complains of congestion.  Objective: Vitals:   01/30/21 1954 01/31/21 0354 01/31/21 0836 01/31/21 1127  BP: 104/60 (!) 104/47  104/67  Pulse: 71 72  84  Resp: _0 Temp: 98.4 F (36.9 C) 98.6 F (37 C)  99.1 F (37.3 C)  TempSrc: Oral Oral  Oral  SpO2: 92% 93% 94% 94%  Weight:  99 kg    Height:        Intake/Output Summary (Last 24 hours) at 01/31/2021 1127 Last data filed at 01/31/2021 0807 Gross per 24 hour  Intake 1000.9 ml  Output 2500 ml  Net -1499.1 ml   Filed Weights   01/29/21 0600 01/30/21 0526 01/31/21 0354  Weight: 103.7 kg 103.8 kg 99 kg    Examination:  General exam: Appears anxious and flat affect.  Looks chronically sick, not in any distress.  On 3 L oxygen. Respiratory system: Mostly conducted airway sounds.  Poor bilateral air entry. Cardiovascular system: S1 & S2 heard, RRR.  No pedal edema. Gastrointestinal system: Soft.  Distended but nontender. Central nervous system: Alert and oriented. No focal neurological deficits. Extremities: Symmetric 5 x 5 power. Alert oriented x4.  Flat affect and anxious mood.    Data Reviewed: I have personally reviewed following labs and imaging studies  CBC: Recent Labs  Lab 01/26/21 2142 01/27/21 0315 01/28/21 0420 01/29/21 0406  WBC 5.8 5.5  --  4.4  NEUTROABS 4.3 3.7  --   --   HGB 8.9* 8.2* 8.4* 8.1*  HCT 30.1* 26.8* 27.5* 26.5*  MCV 87.2 85.6  --  84.4  PLT 402* 376  --  648   Basic Metabolic Panel: Recent Labs  Lab 01/27/21 0315 01/28/21 0420 01/29/21 0406 01/30/21 0917 01/31/21 0539  NA 137 139 141 140 142  K 3.4* 3.5 3.1* 3.0* 3.3*  CL 95* 94* 91* 90* 89*  CO2 31 33* 39* 40* 44*  GLUCOSE 127* 225* 163* 153* 138*  BUN 70* 73* 78* 86* 82*  CREATININE 3.36* 3.26* 3.00*  3.03* 3.04*  CALCIUM 8.4* 9.0 9.1 9.1 9.5  MG 2.1 2.2 2.3  --   --   PHOS  --  6.2* 5.6*  --   --    GFR: Estimated Creatinine Clearance: 26.8 mL/min (A) (by C-G formula based on SCr of 3.04 mg/dL (H)). Liver Function Tests: Recent Labs  Lab 01/26/21 2142 01/27/21 0315 01/28/21 0420 01/29/21 0406  AST 48* 19  --   --   ALT 18 12  --   --   ALKPHOS 104 88  --   --   BILITOT 1.2 0.3  --   --   PROT 6.9 6.5  --   --   ALBUMIN 2.8* 2.6* 2.6* 2.6*   No results for input(s): LIPASE, AMYLASE in the last 168 hours. No results for input(s): AMMONIA in the last 168 hours. Coagulation Profile: No results for input(s): INR, PROTIME in the last 168 hours. Cardiac Enzymes: No results for input(s): CKTOTAL, CKMB, CKMBINDEX, TROPONINI in the  last 168 hours. BNP (last 3 results) No results for input(s): PROBNP in the last 8760 hours. HbA1C: No results for input(s): HGBA1C in the last 72 hours. CBG: Recent Labs  Lab 01/29/21 1618 01/30/21 0601 01/30/21 1140 01/30/21 2113 01/31/21 0610  GLUCAP 132* 91 148* 140* 130*   Lipid Profile: No results for input(s): CHOL, HDL, LDLCALC, TRIG, CHOLHDL, LDLDIRECT in the last 72 hours. Thyroid Function Tests: No results for input(s): TSH, T4TOTAL, FREET4, T3FREE, THYROIDAB in the last 72 hours. Anemia Panel: No results for input(s): VITAMINB12, FOLATE, FERRITIN, TIBC, IRON, RETICCTPCT in the last 72 hours. Sepsis Labs: Recent Labs  Lab 01/26/21 2142 01/26/21 2319 01/27/21 0315  PROCALCITON  --   --  0.50  LATICACIDVEN 2.3* 1.5  --     Recent Results (from the past 240 hour(s))  Resp Panel by RT-PCR (Flu A&B, Covid) Nasopharyngeal Swab     Status: None   Collection Time: 01/26/21 11:53 PM   Specimen: Nasopharyngeal Swab; Nasopharyngeal(NP) swabs in vial transport medium  Result Value Ref Range Status   SARS Coronavirus 2 by RT PCR NEGATIVE NEGATIVE Final    Comment: (NOTE) SARS-CoV-2 target nucleic acids are NOT DETECTED.  The  SARS-CoV-2 RNA is generally detectable in upper respiratory specimens during the acute phase of infection. The lowest concentration of SARS-CoV-2 viral copies this assay can detect is 138 copies/mL. A negative result does not preclude SARS-Cov-2 infection and should not be used as the sole basis for treatment or other patient management decisions. A negative result may occur with  improper specimen collection/handling, submission of specimen other than nasopharyngeal swab, presence of viral mutation(s) within the areas targeted by this assay, and inadequate number of viral copies(<138 copies/mL). A negative result must be combined with clinical observations, patient history, and epidemiological information. The expected result is Negative.  Fact Sheet for Patients:  EntrepreneurPulse.com.au  Fact Sheet for Healthcare Providers:  IncredibleEmployment.be  This test is no t yet approved or cleared by the [place] FDA and  has been authorized for detection and/or diagnosis of SARS-CoV-2 by FDA under an Emergency Use Authorization (EUA). This EUA will remain  in effect (meaning this test can be used) for the duration of the COVID-19 declaration under Section 564(b)(1) of the Act, 21 U.S.C.section 360bbb-3(b)(1), unless the authorization is terminated  or revoked sooner.       Influenza A by PCR NEGATIVE NEGATIVE Final   Influenza B by PCR NEGATIVE NEGATIVE Final    Comment: (NOTE) The Xpert Xpress SARS-CoV-2/FLU/RSV plus assay is intended as an aid in the diagnosis of influenza from Nasopharyngeal swab specimens and should not be used as a sole basis for treatment. Nasal washings and aspirates are unacceptable for Xpert Xpress SARS-CoV-2/FLU/RSV testing.  Fact Sheet for Patients: EntrepreneurPulse.com.au  Fact Sheet for Healthcare Providers: IncredibleEmployment.be  This test is not yet approved or  cleared by the [place] FDA and has been authorized for detection and/or diagnosis of SARS-CoV-2 by FDA under an Emergency Use Authorization (EUA). This EUA will remain in effect (meaning this test can be used) for the duration of the COVID-19 declaration under Section 564(b)(1) of the Act, 21 U.S.C. section 360bbb-3(b)(1), unless the authorization is terminated or revoked.  Performed at [facility], [place] [address] [place], [place] [address]   MRSA PCR Screening     Status: None   Collection Time: 01/27/21  1:07 AM   Specimen: Nasal Mucosa; Nasopharyngeal  Result Value Ref Range Status   MRSA  by PCR NEGATIVE NEGATIVE Final    Comment:        The GeneXpert MRSA Assay (FDA approved for NASAL specimens only), is one component of a comprehensive MRSA colonization surveillance program. It is not intended to diagnose MRSA infection nor to guide or monitor treatment for MRSA infections. Performed at [facility], [place] [address], [place], [place] [address]          Radiology Studies: DG CHEST PORT 1 VIEW  Result Date: 01/30/2021 CLINICAL DATA:  Shortness of breath EXAM: PORTABLE CHEST 1 VIEW COMPARISON:  January 26, 2021 FINDINGS: There is cardiomegaly with pulmonary venous hypertension. There is airspace opacity in the left lower lobe with associated left pleural effusion. There is a degree of interstitial pulmonary edema, slightly less than on most recent study. No adenopathy evident. There is aortic atherosclerosis. No bone lesions. IMPRESSION: Cardiomegaly with pulmonary vascular congestion. Overall less interstitial edema compared to wrist recent study. Airspace opacity left lower lobe with small left pleural effusion. Suspect combination of atelectasis and potential superimposed pneumonia. No new opacity compared to most recent study. Aortic Atherosclerosis (ICD10-I70.0). Electronically Signed   By: Lowella Grip III M.D.   On: 01/30/2021 11:03         Scheduled Meds: . amiodarone  200 mg Oral Daily  . apixaban  5 mg Oral BID  . arformoterol  15 mcg Nebulization BID  . atorvastatin  40 mg Oral QHS  . budesonide (PULMICORT) nebulizer solution  0.5 mg Nebulization BID  . feeding supplement (GLUCERNA SHAKE)  237 mL Oral TID BM  . guaiFENesin  600 mg Oral BID  . insulin aspart  0-15 Units Subcutaneous TID WC  . insulin aspart  0-5 Units Subcutaneous QHS  . insulin aspart  5 Units Subcutaneous TID WC  . insulin detemir  15 Units Subcutaneous BID  . ipratropium-albuterol  3 mL Nebulization BID  . latanoprost  1 drop Both Eyes QHS  . metolazone  5 mg Oral Daily  . pantoprazole  40 mg Oral BID  . potassium chloride  40 mEq Oral TID  . sodium chloride flush  3 mL Intravenous Q12H  . tamsulosin  0.4 mg Oral Daily  . umeclidinium bromide  1 puff Inhalation Daily   Continuous Infusions: . sodium chloride    . furosemide (LASIX) 200 mg in dextrose 5% 100 mL (45m/mL) infusion 6 mg/hr (01/30/21 1606)     LOS: 4 days    Time spent: 32 minutes    KBarb Merino MD [facility] Pager [phone]

## 2021-01-31 NOTE — TOC Progression Note (Addendum)
Transition of Care (TOC) - Progression Note  Heart Failure   Patient Details  Name: Nicholas Caldwell MRN: [id number] Date of Birth: [birth year]  Transition of Care [facility]) CM/SW [place], [place] Phone Number: 01/31/2021, 2:28 PM  Clinical Narrative:    CSW spoke with patient per NP/ Cardiologist request due to patients frequent readmissions. The patient wasn't very responsive with questions asked by CSW. Patient denied having any social needs and when CSW asked about his frequent readmissions to the hospital Mr. Abraha just reported it to be his health and that he will need an ambulance transport home when his is discharged. Patient reports that he has a PCP and is able to get to appointments and pick up his medications. Mr. Arvelo was very lethargic and therefore CSW will assess patient further again at another time.  TOC will continue to follow for d/c needs.         Expected Discharge Plan and Services                                                 Social Determinants of Health (SDOH) Interventions    Readmission Risk Interventions Readmission Risk Prevention Plan 01/09/2021 11/27/2020 11/23/2020  Transportation Screening Complete - Complete  PCP or Specialist Appt within 3-5 Days - Complete -  HRI or [place] - Complete -  Social Work Consult for [place] Planning/Counseling - Complete -  Palliative Care Screening - Not Applicable -  Medication Review Press photographer) Complete Complete Complete  Med Review Comments - - -  PCP or Specialist appointment within 3-5 days of discharge - - -  PCP/Specialist Appt Not Complete comments - - -  [place] or [place] Complete - Complete  SW Recovery Care/Counseling Consult Complete - Complete  Palliative Care Screening Not Applicable - Not [place] Patient Refused - Not Applicable  Some recent data might be hidden   [place]h Yeagley, MSW, LCSWA [phone] Heart  Failure Social Worker

## 2021-01-31 NOTE — Consult Note (Addendum)
Advanced Heart Failure Team Consult Note   Primary Physician: [name], MD PCP-Cardiologist:  No primary care provider on file.  Reason for Consultation: A/C Systolic Heart Failure   HPI:    Nicholas Caldwell is seen today for evaluation of heart failure at the request of Dr Sloan Leiter.   Nicholas Caldwell is a 75 year old history ofchronic primarily diastolic CHF with severe RV dysfunction, COPD on 3 L O2, DM, HTN, OHS/OSA on CPAP,paroxysmal atrial flutter/fibrillations/p DCCV, hyperlipidemia, CAD, and noncompliance.He has multiple admissions due to HF and respiratory failure.Most recent echo in 11/21 showed EF 50-55% with severely enlarged and severely dysfunctional RV, PASP 68 mmHg, moderate-severe TR.   Recently admitted, last month from 2/8-2/16/22 left-sided PNA, and creatinine was up to 3.56 from baseline around 1.8.He was also noted to be in rate-controlled atrial flutter (had been in NSR in the recent past). He was markedly hypokalemic. Eliquis was held with encephalopathy and he was started on heparin gtt. He was started on vancomycin/cefepime/Flagyl. K repleated. He was started on IV fluid w/ improvement in symptoms. SCr improved and stablized ~2.2. Was eventually restarted back on PO diuretics, torsemide 80 mg daily. Prior to d/c, he underwent successful TEE guided DCCV back to NSR. Of note, LVEF was slightly reduced at 45-50%. RV severely reduced.   Presented 3/5 with A/C diastolic heart failure and A fib . Diuresed with IV lasix and transitioned to 80 mg twice a day. Also had cardioversion for A fib-->NSR. EP consulted and he was not a candidate for ablation.  Discharged on 01/10/21. He was referred to [facility] at discharge.   Of note he has cancelled last 3 HF appointments. Has had 6 readmits over the last 6 months.   He is not sure what triggered hospital admit. He says he has limited mobility and takes a few steps in the house. On 3.5 liters oxygen chronically. SOB with  exertion.   Readmitted 3/26 with increased shortness of breath. CXR with progressive pulmonary edema.  Lactic Acid 2.3>1.5, HS Trop 41>47, K 3.4, Creatinine 3.4 , and BNP 3609. Started on IV lasix.  3/27 Renal US- Unremarkable. Had repeat CXR with ongoing pulmonary edema and superimposed pneumonia. I/O not accurate. Oxygen cut back down from 6 liters to 4 liters.    Echo-LVEF 45-50%, RV severely reduced   Review of Systems: [y] = yes, _0  = no   . General: Weight gain _1 ; Weight loss _2 ; Anorexia _3 ; Fatigue [ Y]; Fever _4 ; Chills _5 ; Weakness [Y ]  . Cardiac: Chest pain/pressure _6 ; Resting SOB _7 ; Exertional SOB [ Y]; Orthopnea [Y ]; Pedal Edema _8 ; Palpitations _9 ; Syncope _10 ; Presyncope _11 ; Paroxysmal nocturnal dyspnea_12   . Pulmonary: Cough _13 ; Wheezing[ Y]; Hemoptysis_14 ; Sputum [Y ]; Snoring _15   . GI: Vomiting_16 ; Dysphagia_17 ; Melena_18 ; Hematochezia _19 ; Heartburn_20 ; Abdominal pain _21 ; Constipation _22 ; Diarrhea _23 ; BRBPR _24   . GU: Hematuria_25 ; Dysuria _26 ; Nocturia_27   . Vascular: Pain in legs with walking _28 ; Pain in feet with lying flat _29 ; Non-healing sores _30 ; Stroke _31 ; TIA _32 ; Slurred speech _33 ;  . Neuro: Headaches_34 ; Vertigo_35 ; Seizures_36 ; Paresthesias_37 ;Blurred vision _38 ; Diplopia _39 ; Vision changes _40   . Ortho/Skin: Arthritis Jazmín.Cullens ]; Joint pain [Y ]; Muscle pain _41 ; Joint swelling _42 ; Back Pain [Y ];  Rash _0   . Psych: Depression_1 ; Anxiety_2   . Heme: Bleeding problems _3 ; Clotting disorders _4 ; Anemia [ Y]  . Endocrine: Diabetes [Y ]; Thyroid dysfunction_5   Home Medications Prior to Admission medications   Medication Sig Start Date End Date Taking? Authorizing Provider  acetaminophen (TYLENOL) 500 MG tablet Take 500 mg by mouth every 6 (six) hours as needed for moderate pain.   Yes [provider]  amiodarone (PACERONE) 200 MG tablet Take 1 tablet (200 mg total) by mouth daily. 01/11/21  Yes Nita Sells, MD  apixaban  (ELIQUIS) 5 MG TABS tablet Take 5 mg by mouth 2 (two) times daily.   Yes [provider]  ascorbic acid (VITAMIN C) 500 MG tablet Take 500 mg by mouth daily. 07/03/20  Yes [provider]  atorvastatin (LIPITOR) 40 MG tablet Take 1 tablet (40 mg total) by mouth daily. Patient taking differently: Take 40 mg by mouth at bedtime. 10/30/20  Yes Larey Dresser, MD  Cholecalciferol (VITAMIN D) 50 MCG (2000 UT) CAPS Take 2,000 Units by mouth daily.   Yes [provider]  famotidine (PEPCID) 20 MG tablet TAKE 1 TABLET(20 MG) BY MOUTH TWICE DAILY Patient taking differently: Take 20 mg by mouth 2 (two) times daily as needed for indigestion. 10/29/20  Yes Larey Dresser, MD  feeding supplement, GLUCERNA SHAKE, (GLUCERNA SHAKE) LIQD Take 237 mLs by mouth 3 (three) times daily between meals. 11/27/20  Yes Swayze, Ava, DO  fluticasone (FLONASE) 50 MCG/ACT nasal spray Place 2 sprays into both nostrils in the morning and at bedtime.   Yes [provider]  gabapentin (NEURONTIN) 300 MG capsule Take 300 mg by mouth daily as needed (pain). 12/03/20  Yes [provider]  insulin aspart protamine- aspart (NOVOLOG MIX 70/30) (70-30) 100 UNIT/ML injection Inject 10 Units into the skin 2 (two) times daily with a meal.   Yes [provider]  latanoprost (XALATAN) 0.005 % ophthalmic solution Place 1 drop into both eyes at bedtime.   Yes [provider]  metolazone (ZAROXOLYN) 2.5 MG tablet TAKE ONE TABLET BY MOUTH AS DIRECTED  THREE TIMES PER WEEK 07/03/20  Yes [provider]  oxyCODONE-acetaminophen (PERCOCET) 10-325 MG tablet Take 1 tablet by mouth every 6 (six) hours.   Yes [provider]  pantoprazole (PROTONIX) 40 MG tablet Take 40 mg by mouth 2 (two) times daily. 07/03/20  Yes [provider]  potassium chloride SA (KLOR-CON) 20 MEQ tablet Take 1 tablet (20 mEq total) by mouth 2 (two) times daily. 01/10/21  Yes Nita Sells,  MD  senna-docusate (SENOKOT-S) 8.6-50 MG tablet Take 1 tablet by mouth at bedtime as needed for mild constipation. 07/03/20  Yes [provider]  torsemide (DEMADEX) 20 MG tablet Take 80 mg by mouth 2 (two) times daily. 01/10/21  Yes [provider]  albuterol (VENTOLIN HFA) 108 (90 Base) MCG/ACT inhaler Inhale 2 puffs into the lungs every 6 (six) hours as needed for wheezing or shortness of breath.    [provider]  Budeson-Glycopyrrol-Formoterol (BREZTRI AEROSPHERE) 160-9-4.8 MCG/ACT AERO Inhale 2 puffs into the lungs 2 (two) times daily. 06/21/20   Tanda Rockers, MD  carbamide peroxide (DEBROX) 6.5 % OTIC solution Place 5-10 drops into both ears 3 (three) times daily as needed (ear wax).    [provider]  Carboxymethylcellulose Sod PF 0.25 % SOLN Place 1 drop into both eyes 2 (two) times daily as needed (dry eyes).  [provider]  ferrous sulfate 325 (65 FE) MG tablet Take 325 mg by mouth every Monday, Wednesday, and Friday at 6 PM.    [provider]  hydrocortisone (ANUSOL-HC) 2.5 % rectal cream Place 1 application rectally 2 (two) times daily as needed for hemorrhoids or itching.     [provider]  Insulin Syringe 27G X 1/2" 0.5 ML MISC 100 Syringes by Does not apply route 3 (three) times daily. 08/29/19   Hosie Poisson, MD  ipratropium-albuterol (DUONEB) 0.5-2.5 (3) MG/3ML SOLN Take 3 mLs by nebulization 4 (four) times daily as needed (sob/wheezing).    [provider]  Nystatin (GERHARDT'S BUTT CREAM) CREA Apply 1 application topically 2 (two) times daily. 01/10/21   Nita Sells, MD  OXYGEN Inhale 3 L/min into the lungs continuous.    [provider]  polyethylene glycol powder (GLYCOLAX/MIRALAX) 17 GM/SCOOP powder Take 17 g by mouth daily as needed for constipation. 07/03/20   [provider]  tamsulosin (FLOMAX) 0.4 MG CAPS capsule Take 1 capsule (0.4 mg total) by mouth daily. Patient  taking differently: Take 0.4 mg by mouth daily after breakfast. 10/09/20   Geradine Girt, DO    Past Medical History: Past Medical History:  Diagnosis Date  . Atrial flutter ([place])    a. recurrent AFlutter with RVR;  b. Amiodarone Rx started 4/16  . CAD (coronary artery disease)    a. LHC 1/16:  mLAD diffuse disease, pLCx mild disease, dLCx with disease but too small for PCI, RCA ok, EF 25-30%  . Chronic pain   . Chronic systolic CHF (congestive heart failure) ([place])   . COPD (chronic obstructive pulmonary disease) ([place])   . Diabetes mellitus without complication ([place])   . Ex-smoker 11/2017  . Hypercholesteremia   . Hypertension   . NICM (nonischemic cardiomyopathy) ([place])    a.dx 2016. b. 2D echo 06/2016 - Last echo 07/01/16: mod dilated LV, mod LVH, EF 25-30%, mild-mod Nicholas, sev LAE, mild-mod reduced RV systolic function, mild-mod TR, PASP 26mHG.  .Marland KitchenPAF (paroxysmal atrial fibrillation) ([facility])    On amio - ot a candidate for flecainide due to cardiomyopathy, not a candidate for Tikosyn due to prolonged QT, and felt to be a poor candidate for ablation given left atrial size.  . Pulmonary hypertension ([place]   . Sleep apnea   . Tobacco abuse     Past Surgical History: Past Surgical History:  Procedure Laterality Date  . BIOPSY  03/11/2019   Procedure: BIOPSY;  Surgeon: CLavena Bullion DO;  [place]  Service: Gastroenterology;;  . BIOPSY  03/15/2019   Procedure: BIOPSY;  Surgeon: MIrving Copas, MD;  [place]  Service: Gastroenterology;;  . CARDIOVERSION N/A 07/30/2018   Procedure: CARDIOVERSION;  Surgeon: MLarey Dresser MD;  [facility];  Service: Cardiovascular;  Laterality: N/A;  . CARDIOVERSION N/A 12/17/2020   Procedure: CARDIOVERSION;  Surgeon: MLarey Dresser MD;  [facility];  Service: Cardiovascular;  Laterality: N/A;  . CARDIOVERSION N/A 01/10/2021   Procedure: CARDIOVERSION;  Surgeon: MLarey Dresser MD;   [facility];  Service: Cardiovascular;  Laterality: N/A;  . COLONOSCOPY N/A 03/11/2019   Procedure: COLONOSCOPY;  Surgeon: CLavena Bullion DO;  [place]  Service: Gastroenterology;  Laterality: N/A;  . ENTEROSCOPY N/A 03/15/2019   Procedure: ENTEROSCOPY;  Surgeon: MRush LandmarkGTelford Nab, MD;  [place]  Service: Gastroenterology;  Laterality: N/A;  . ESOPHAGOGASTRODUODENOSCOPY Left 03/11/2019   Procedure: ESOPHAGOGASTRODUODENOSCOPY (EGD);  Surgeon: Lavena Bullion, DO;  Location: [facility] ENDOSCOPY;  Service: Gastroenterology;  Laterality: Left;  . LEFT AND RIGHT HEART CATHETERIZATION WITH CORONARY ANGIOGRAM N/A 11/06/2014   Procedure: LEFT AND RIGHT HEART CATHETERIZATION WITH CORONARY ANGIOGRAM;  Surgeon: Jettie Booze, MD;  [facility] LAB;  Service: Cardiovascular;  Laterality: N/A;  . RIGHT/LEFT HEART CATH AND CORONARY ANGIOGRAPHY N/A 12/06/2018   Procedure: RIGHT/LEFT HEART CATH AND CORONARY ANGIOGRAPHY;  Surgeon: Larey Dresser, MD;  Location: [place] CV LAB;  Service: Cardiovascular;  Laterality: N/A;  . SPLENECTOMY    . SUBMUCOSAL TATTOO INJECTION  03/15/2019   Procedure: SUBMUCOSAL TATTOO INJECTION;  Surgeon: Irving Copas., MD;  Location: [place];  Service: Gastroenterology;;  . TEE WITHOUT CARDIOVERSION N/A 12/17/2020   Procedure: TRANSESOPHAGEAL ECHOCARDIOGRAM (TEE);  Surgeon: Larey Dresser, MD;  Location[facility] END[facility]COPY;  Service: Cardiovascular;  Laterality: N/A;    Family History: Family History  Problem Relation Age of Onset  . Heart disease Mother   . Hypertension Mother   . Heart failure Mother   . Heart disease Father   . Hypertension Sister   . Heart attack Neg Hx   . Stroke Neg Hx   . Colon cancer Neg Hx   . Esophageal cancer Neg Hx     Social History: Social History   Socioeconomic History  . Marital status: Married    Spouse name: Not on file  . Number of children: Not on file  . Years of  education: Not on file  . Highest education level: Not on file  Occupational History  . Not on file  Tobacco Use  . Smoking status: Former Smoker    Packs/day: 1.00    Years: 34.00    Pack years: 34.00    Types: Cigarettes    Quit date: 11/30/2018    Years since quitting: 2.1  . Smokeless tobacco: Never Used  Vaping Use  . Vaping Use: Never used  Substance and Sexual Activity  . Alcohol use: No    Alcohol/week: 0.0 standard drinks  . Drug use: No  . Sexual activity: Not on file  Other Topics Concern  . Not on file  Social History Narrative   ** Merged History Encounter **       Social Determinants of Health   Financial Resource Strain: Low Risk   . Difficulty of Paying Living Expenses: Not very hard  Food Insecurity: No Food Insecurity  . Worried About Charity fundraiser in the Last Year: Never true  . Ran Out of Food in the Last Year: Never true  Transportation Needs: No Transportation Needs  . Lack of Transportation (Medical): No  . Lack of Transportation (Non-Medical): No  Physical Activity: Not on file  Stress: Not on file  Social Connections: Not on file    Allergies:  Allergies  Allergen Reactions  . Isosorb Dinitrate-Hydralazine Shortness Of Breath  . Benazepril Nausea And Vomiting  . Brimonidine Other (See Comments)    Dry eyes  . Metformin Other (See Comments)    Cramps and abdominal pain  . Morphine Rash    Objective:    Vital Signs:   Temp:  [98.4 F (36.9 C)-98.6 F (37 C)] 98.6 F (37 C) (03/31 0354) Pulse Rate:  [71-77] 72 (03/31 0354) Resp:  [18-19] 19 (03/31 0354) BP: (104-110)/(47-65) 104/47 (03/31 0354) SpO2:  [92 %-94 %] 94 % (03/31 0836) FiO2 (%):  [36 %] 36 % (03/31 0836) Weight:  [99 kg] 99 kg (03/31  0354) Last BM Date: 01/28/21  Weight change: Filed Weights   01/29/21 0600 01/30/21 0526 01/31/21 0354  Weight: 103.7 kg 103.8 kg 99 kg    Intake/Output:   Intake/Output Summary (Last 24 hours) at 01/31/2021 0926 Last data  filed at 01/31/2021 0807 Gross per 24 hour  Intake 1000.9 ml  Output 2500 ml  Net -1499.1 ml      Physical Exam    General:  Sitting in the chair.  No resp difficulty HEENT: normal Neck: supple. JVP . Carotids 2+ bilat; no bruits. No lymphadenopathy or thyromegaly appreciated. Cor: PMI nondisplaced. Irregular rate & rhythm. No rubs, gallops or murmurs. Lungs: EW throughout on 4 liters [place].  Abdomen: soft, nontender, nondistended. No hepatosplenomegaly. No bruits or masses. Good bowel sounds. Extremities: no cyanosis, clubbing, rash, edema Neuro: alert & orientedx3, cranial nerves grossly intact. moves all 4 extremities w/o difficulty. Affect pleasant   Telemetry    A fib 70-80s   EKG   SR with PVC on admit  Labs   Basic Metabolic Panel: Recent Labs  Lab 01/27/21 0315 01/28/21 0420 01/29/21 0406 01/30/21 0917 01/31/21 0539  NA 137 139 141 140 142  K 3.4* 3.5 3.1* 3.0* 3.3*  CL 95* 94* 91* 90* 89*  CO2 31 33* 39* 40* 44*  GLUCOSE 127* 225* 163* 153* 138*  BUN 70* 73* 78* 86* 82*  CREATININE 3.36* 3.26* 3.00* 3.03* 3.04*  CALCIUM 8.4* 9.0 9.1 9.1 9.5  MG 2.1 2.2 2.3  --   --   PHOS  --  6.2* 5.6*  --   --     Liver Function Tests: Recent Labs  Lab 01/26/21 2142 01/27/21 0315 01/28/21 0420 01/29/21 0406  AST 48* 19  --   --   ALT 18 12  --   --   ALKPHOS 104 88  --   --   BILITOT 1.2 0.3  --   --   PROT 6.9 6.5  --   --   ALBUMIN 2.8* 2.6* 2.6* 2.6*   No results for input(s): LIPASE, AMYLASE in the last 168 hours. No results for input(s): AMMONIA in the last 168 hours.  CBC: Recent Labs  Lab 01/26/21 2142 01/27/21 0315 01/28/21 0420 01/29/21 0406  WBC 5.8 5.5  --  4.4  NEUTROABS 4.3 3.7  --   --   HGB 8.9* 8.2* 8.4* 8.1*  HCT 30.1* 26.8* 27.5* 26.5*  MCV 87.2 85.6  --  84.4  PLT 402* 376  --  375    Cardiac Enzymes: No results for input(s): CKTOTAL, CKMB, CKMBINDEX, TROPONINI in the last 168 hours.  BNP: BNP (last 3 results) Recent Labs     12/11/20 0730 01/05/21 2213 01/26/21 2142  BNP 1,858.3* 3,522.2* 3,609.9*    ProBNP (last 3 results) No results for input(s): PROBNP in the last 8760 hours.   CBG: Recent Labs  Lab 01/29/21 1618 01/30/21 0601 01/30/21 1140 01/30/21 2113 01/31/21 0610  GLUCAP 132* 91 148* 140* 130*    Coagulation Studies: No results for input(s): LABPROT, INR in the last 72 hours.   Imaging   DG CHEST PORT 1 VIEW  Result Date: 01/30/2021 CLINICAL DATA:  Shortness of breath EXAM: PORTABLE CHEST 1 VIEW COMPARISON:  January 26, 2021 FINDINGS: There is cardiomegaly with pulmonary venous hypertension. There is airspace opacity in the left lower lobe with associated left pleural effusion. There is a degree of interstitial pulmonary edema, slightly less than on most recent study. No adenopathy evident. There  is aortic atherosclerosis. No bone lesions. IMPRESSION: Cardiomegaly with pulmonary vascular congestion. Overall less interstitial edema compared to wrist recent study. Airspace opacity left lower lobe with small left pleural effusion. Suspect combination of atelectasis and potential superimposed pneumonia. No new opacity compared to most recent study. Aortic Atherosclerosis (ICD10-I70.0). Electronically Signed   By: Lowella Grip III M.D.   On: 01/30/2021 11:03      Medications:     Current Medications: . amiodarone  200 mg Oral Daily  . apixaban  5 mg Oral BID  . arformoterol  15 mcg Nebulization BID  . atorvastatin  40 mg Oral QHS  . budesonide (PULMICORT) nebulizer solution  0.5 mg Nebulization BID  . feeding supplement (GLUCERNA SHAKE)  237 mL Oral TID BM  . guaiFENesin  600 mg Oral BID  . insulin aspart  0-15 Units Subcutaneous TID WC  . insulin aspart  0-5 Units Subcutaneous QHS  . insulin aspart  5 Units Subcutaneous TID WC  . insulin detemir  15 Units Subcutaneous BID  . ipratropium-albuterol  3 mL Nebulization BID  . latanoprost  1 drop Both Eyes QHS  . metolazone  5 mg  Oral Daily  . pantoprazole  40 mg Oral BID  . potassium chloride  40 mEq Oral TID  . sodium chloride flush  3 mL Intravenous Q12H  . tamsulosin  0.4 mg Oral Daily  . umeclidinium bromide  1 puff Inhalation Daily     Infusions: . sodium chloride    . furosemide (LASIX) 200 mg in dextrose 5% 100 mL (65m/mL) infusion 6 mg/hr (01/30/21 1606)        Assessment/Plan   1. A/C Diastolic HF TEE 28/8325LVEF mildly reduced 45-50%. RV severely reduced. BNP >3000 on admit.  Volume status improving. Continue lasix drip one more day. I/O not accurate. Overall diuresed 28 pounds. At home he takes torsemide 80/80. Would need metolazone a couple days a week.  -Renal function worse this admit. Continue K supplements.  - No spiro/arb with elevated creatinine.   2. CKD Stage IV Creatinine Baseline ~2.  - Creatinine on admit 3.5, today 3.  -Renal [place] no hydronephrosis.   3. PAF He has history of CVA. He had DCCV in 3/20 and again in 5/20. He failed amiodarone in the past and cannot take Tikosyn due to baseline prolonged QT interval.Underwent recent TEE/DCCV for AFL 12/17/20. Most recent DC-CV 01/10/21 with conversion to NSR EP saw last admit and he was not a candidate for ablation.  Get EKG today looks like he is back in A fib.  - Stop amio.   - Continue eliquis.  - May need to focus on rate control.   4. A/C Respiratory Failure, COPD. On 3.5 liters chronically. ? PNA  + Volume overload.   5. OSA On CPAP  6. CAD  Nonobstructive disease 12/2018. No chest pain.  - Continue statin.   7. Deconditioning  PT/OT recommending SNF.   He is difficult to manage in the community. He has missed the last 3 HF appointments. He was last seen in the HF clinic on 08/28/21. He was discharged from HF Paramedicine program due to multiple no shows and cancelled appointments.   Length of Stay: 4  [name], NP  01/31/2021, 9:26 AM  Advanced Heart Failure Team Pager [phone](M-F; 7a - 5p)  Please  contact [facility] for night-coverage after hours (4p -7a ) and weekends on [email or web address]  Patient seen with NP, agree with the above note.  Very difficult patient. Noncompliant with outpatient follow and has refused home health, paramedicine.  He is back again with CHF.  He has been extensively diuresed in the hospital.  Creatinine stable at 3.04, above his baseline but lower than admission.  He is remaining in NSR after DCCV at last admission.  He looks euvolemic on my exam.  He has predominant RV failure.   Would recommend continuing amiodarone and Eliquis.  Continue Lasix gtt today, transition over to torsemide 80 mg bid tomorrow.  He will need probably once weekly metolazone 2.5 after discharge.  Would try again to get followup in CHF clinic. Would arrange for remote health chronic disease management after discharge.   Loralie Champagne 01/31/2021 1:17 PM

## 2021-01-31 NOTE — Consult Note (Signed)
Consultation Note Date: 01/31/2021   Patient Name: Nicholas Caldwell  DOB: [birth year]  MRN: [id number]  Age / Sex: 75 y.o., male  PCP: [name], MD Referring Physician: Barb Merino, MD  Reason for Consultation: Establishing goals of care  Per intake ->HPI/Patient Profile: 75 y.o. male  with past medical history of systolic and diastolic congestive heart failure (last echo 12/2020 EF 45-50%), paroxysmal atrial fibrillation (S/P DCCV 12/2020 and ablation 01/10/2021), obstructive sleep apnea on CPAP, chronic respiratory failure (on 3lpm via [place] continuous), COPD, coronary artery disease (nonobstructive via cath 12/2018), insulin-dependent diabetes mellitus type 2, pulmonary hypertension, chronic pain syndrome, benign prostatic hyperplasia, CKD3b, hyperlipidemia, and stage 2 pressure ulcer admitted on 01/26/2021 with worsening shortness of breath, acute heart failure exacerbation, and acute COPD exacerbation.  Of note, patient was recently hospitalized at [facility] from 3/5 until 3/10.  Patient was managed for acute congestive heart failure with the assistance of the heart failure consultation team with intravenous diuretics.   Patient has been admitted 6 times over the past 6 months. Palliative medicine has been consulted to assist with determining goals of care.   Clinical Assessment and Goals of Care:  I have reviewed medical records including EPIC notes, labs and imaging, received report from RN Jodell Cipro, assessed the patient and met at the bedside to discuss diagnosis, prognosis, GOC, EOL wishes, disposition and options.  I introduced Palliative Medicine as specialized medical care for people living with serious illness. It focuses on providing relief from the symptoms and stress of a serious illness. The goal is to improve quality of life for both the patient and the family.  I attempted to elicit values  and goals of care important to the patient.    We discussed a brief life review of the patient and then focused on their current illness. The natural disease trajectory and expectations at EOL were discussed.  Nicholas Caldwell has lived in [place] for 30 years and has been married to Nicholas Caldwell for 25 years. They share one daughter, Nicholas Caldwell, and 1 granddaughter. He has worked for [employer] and in [employer] in the past. Patient shares that his illness has severely limited his functioning for the past 5 years. He is only able to walk a short distance before feeling dyspneic, approximately from the hospital bed to the door of patient's room. He shrugs when asked if this has gradual worsening over time, states it has been this way for the entire 5 years. He gives himself sponge baths over the areas he can reach. His wife does the cooking and cleaning and she does not currently work. He has home health equipment and mentions needing an ambulance for transportation upon discharge. He states that he does not do much for enjoyment besides watching TV. Being at home with his family is very important to him and he would never want to go to a nursing home for rehab. He is a Psychologist, forensic.  Environmental health practitioner and opportunity for thoughts and feelings regarding his current illness and expectations for  the future. He is stoic and shrugs frequently, citing "heart problems." With patient's permission, I shared my concern that recurrent hospitalizations do not seem to be improving his symptoms or quality of life. He states that his current quality of life is acceptable to him, although "they aren't doing enough to treat my pain."  The difference between aggressive medical intervention and comfort care was considered in light of the patient's goals of care.   Hospice and Palliative Care services outpatient were explained and offered. Nicholas Caldwell states that hospice has been mentioned in past discussions with cardiology. Provided education  on the philosophy of hospice and expert symptom management available in the home.  A MOST form was introduced and extensive conversation was had, covering concepts specific to code status, artifical feeding and hydration, continued IV antibiotics and rehospitalization. Provide education that resuscitation efforts would be highly likely to cause suffering while very unlikely to succeed. Discussed that resuscitation would also not improve his function. Nicholas Caldwell confirms that he still wishes to be a full code at this time. He is open to a long-term feeding tube and additional rehospitalizations as well.    Offered to arrange a meeting with his family to discuss options further and complete the MOST form with their support. He asks "what is palliative care? Am I going to die?" Discussed that anything is possible at any time and thus the importance of advance care planning while he is still able to verbalize his own wishe. Pt gave permission to call his wife Nicholas Caldwell after our conversation. I was unable to reach her or leave a message. She is currently in [place] with a daughter from a previous marriage who has cancer.   Discussed the importance of continued conversation with family and the medical providers regarding overall plan of care and treatment options, ensuring decisions are within the context of the patient's values and GOCs.    Questions and concerns were addressed. The patient was encouraged to call with questions or concerns.  PMT will continue to support holistically.   PATIENT is the primary decision maker. No HCPOA on file.     SUMMARY OF RECOMMENDATIONS   -FULL code/FULL scope -Ongoing GOC discussions; his wife will be back in [place] late tomorrow  Code Status/Advance Care Planning:  Full code  Palliative Prophylaxis:   Delirium Protocol, Frequent Pain Assessment, Palliative Wound Care and Turn Reposition  Additional Recommendations (Limitations, Scope,  Preferences):  Full Scope Treatment  Psycho-social/Spiritual:   Desire for further Chaplaincy support:no  Additional Recommendations: Referral to [facility]PS Prognosis:   Poor long-term prognosis given multiple rehospitalizations, functional decline, multiple comorbidities  Discharge Planning: Home with Palliative Services      Primary Diagnoses: Present on Admission: . Acute cardiogenic pulmonary edema (HCC) . Acute on chronic combined systolic and diastolic CHF (congestive heart failure) ([place]) . Essential hypertension . Coronary artery disease involving native coronary artery of native heart . OSA (obstructive sleep apnea) . Acute renal failure superimposed on stage 3b chronic kidney disease ([place]) . PAF (paroxysmal atrial fibrillation) ([place]) . Mixed diabetic hyperlipidemia associated with type 2 diabetes mellitus ([place]) . COPD with acute exacerbation ([place]) . Pressure ulcer of left buttock . Leg weakness, bilateral . Acute on chronic respiratory failure with hypoxia (HCC)   I have reviewed the medical record, interviewed the patient and family, and examined the patient. The following aspects are pertinent.  Past Medical History:  Diagnosis Date  . Atrial flutter (HCC)    a. recurrent  AFlutter with RVR;  b. Amiodarone Rx started 4/16  . CAD (coronary artery disease)    a. LHC 1/16:  mLAD diffuse disease, pLCx mild disease, dLCx with disease but too small for PCI, RCA ok, EF 25-30%  . Chronic pain   . Chronic systolic CHF (congestive heart failure) ([place])   . COPD (chronic obstructive pulmonary disease) ([place])   . Diabetes mellitus without complication ([place])   . Ex-smoker 11/2017  . Hypercholesteremia   . Hypertension   . NICM (nonischemic cardiomyopathy) ([place])    a.dx 2016. b. 2D echo 06/2016 - Last echo 07/01/16: mod dilated LV, mod LVH, EF 25-30%, mild-mod MR, sev LAE, mild-mod reduced RV systolic function, mild-mod TR, PASP 67mHG.  .Marland KitchenPAF (paroxysmal atrial  fibrillation) (HCC)    On amio - ot a candidate for flecainide due to cardiomyopathy, not a candidate for Tikosyn due to prolonged QT, and felt to be a poor candidate for ablation given left atrial size.  . Pulmonary hypertension ([place]   . Sleep apnea   . Tobacco abuse    Social History   Socioeconomic History  . Marital status: Married    Spouse name: Not on file  . Number of children: Not on file  . Years of education: Not on file  . Highest education level: Not on file  Occupational History  . Not on file  Tobacco Use  . Smoking status: Former Smoker    Packs/day: 1.00    Years: 34.00    Pack years: 34.00    Types: Cigarettes    Quit date: 11/30/2018    Years since quitting: 2.1  . Smokeless tobacco: Never Used  Vaping Use  . Vaping Use: Never used  Substance and Sexual Activity  . Alcohol use: No    Alcohol/week: 0.0 standard drinks  . Drug use: No  . Sexual activity: Not on file  Other Topics Concern  . Not on file  Social History Narrative   ** Merged History Encounter **       Social Determinants of Health   Financial Resource Strain: Low Risk   . Difficulty of Paying Living Expenses: Not very hard  Food Insecurity: No Food Insecurity  . Worried About RCharity fundraiserin the Last Year: Never true  . Ran Out of Food in the Last Year: Never true  Transportation Needs: No Transportation Needs  . Lack of Transportation (Medical): No  . Lack of Transportation (Non-Medical): No  Physical Activity: Not on file  Stress: Not on file  Social Connections: Not on file   Family History  Problem Relation Age of Onset  . Heart disease Mother   . Hypertension Mother   . Heart failure Mother   . Heart disease Father   . Hypertension Sister   . Heart attack Neg Hx   . Stroke Neg Hx   . Colon cancer Neg Hx   . Esophageal cancer Neg Hx    Scheduled Meds: . [START ON 02/01/2021] amiodarone  200 mg Oral Daily  . apixaban  5 mg Oral BID  . arformoterol  15 mcg  Nebulization BID  . atorvastatin  40 mg Oral QHS  . budesonide (PULMICORT) nebulizer solution  0.5 mg Nebulization BID  . feeding supplement (GLUCERNA SHAKE)  237 mL Oral TID BM  . guaiFENesin  600 mg Oral BID  . insulin aspart  0-15 Units Subcutaneous TID WC  . insulin aspart  0-5 Units Subcutaneous QHS  . insulin aspart  5  Units Subcutaneous TID WC  . insulin detemir  15 Units Subcutaneous BID  . ipratropium-albuterol  3 mL Nebulization BID  . latanoprost  1 drop Both Eyes QHS  . metolazone  5 mg Oral Daily  . pantoprazole  40 mg Oral BID  . potassium chloride  40 mEq Oral TID  . sodium chloride flush  3 mL Intravenous Q12H  . tamsulosin  0.4 mg Oral Daily  . umeclidinium bromide  1 puff Inhalation Daily   Continuous Infusions: . sodium chloride    . furosemide (LASIX) 200 mg in dextrose 5% 100 mL (28m/mL) infusion 6 mg/hr (01/30/21 1606)   PRN Meds:.sodium chloride, acetaminophen, famotidine, fluticasone, ipratropium-albuterol, ondansetron (ZOFRAN) IV, oxyCODONE-acetaminophen **AND** oxyCODONE, polyvinyl alcohol, sodium chloride flush Medications Prior to Admission:  Prior to Admission medications   Medication Sig Start Date End Date Taking? Authorizing Provider  acetaminophen (TYLENOL) 500 MG tablet Take 500 mg by mouth every 6 (six) hours as needed for moderate pain.   Yes [provider]  amiodarone (PACERONE) 200 MG tablet Take 1 tablet (200 mg total) by mouth daily. 01/11/21  Yes SNita Sells MD  apixaban (ELIQUIS) 5 MG TABS tablet Take 5 mg by mouth 2 (two) times daily.   Yes [provider]  ascorbic acid (VITAMIN C) 500 MG tablet Take 500 mg by mouth daily. 07/03/20  Yes [provider]  atorvastatin (LIPITOR) 40 MG tablet Take 1 tablet (40 mg total) by mouth daily. Patient taking differently: Take 40 mg by mouth at bedtime. 10/30/20  Yes MLarey Dresser MD  Cholecalciferol (VITAMIN D) 50 MCG (2000 UT) CAPS Take 2,000 Units by mouth  daily.   Yes [provider]  famotidine (PEPCID) 20 MG tablet TAKE 1 TABLET(20 MG) BY MOUTH TWICE DAILY Patient taking differently: Take 20 mg by mouth 2 (two) times daily as needed for indigestion. 10/29/20  Yes MLarey Dresser MD  feeding supplement, GLUCERNA SHAKE, (GLUCERNA SHAKE) LIQD Take 237 mLs by mouth 3 (three) times daily between meals. 11/27/20  Yes Swayze, Ava, DO  fluticasone (FLONASE) 50 MCG/ACT nasal spray Place 2 sprays into both nostrils in the morning and at bedtime.   Yes [provider]  gabapentin (NEURONTIN) 300 MG capsule Take 300 mg by mouth daily as needed (pain). 12/03/20  Yes [provider]  insulin aspart protamine- aspart (NOVOLOG MIX 70/30) (70-30) 100 UNIT/ML injection Inject 10 Units into the skin 2 (two) times daily with a meal.   Yes [provider]  latanoprost (XALATAN) 0.005 % ophthalmic solution Place 1 drop into both eyes at bedtime.   Yes [provider]  metolazone (ZAROXOLYN) 2.5 MG tablet TAKE ONE TABLET BY MOUTH AS DIRECTED  THREE TIMES PER WEEK 07/03/20  Yes [provider]  oxyCODONE-acetaminophen (PERCOCET) 10-325 MG tablet Take 1 tablet by mouth every 6 (six) hours.   Yes [provider]  pantoprazole (PROTONIX) 40 MG tablet Take 40 mg by mouth 2 (two) times daily. 07/03/20  Yes [provider]  potassium chloride SA (KLOR-CON) 20 MEQ tablet Take 1 tablet (20 mEq total) by mouth 2 (two) times daily. 01/10/21  Yes SNita Sells MD  senna-docusate (SENOKOT-S) 8.6-50 MG tablet Take 1 tablet by mouth at bedtime as needed for mild constipation. 07/03/20  Yes [provider]  torsemide (DEMADEX) 20 MG tablet Take 80 mg by mouth 2 (two) times daily. 01/10/21  Yes [provider]  albuterol (VENTOLIN HFA) 108 (90 Base) MCG/ACT inhaler Inhale 2 puffs  into the lungs every 6 (six) hours as needed for wheezing or shortness of breath.    [provider]   Budeson-Glycopyrrol-Formoterol (BREZTRI AEROSPHERE) 160-9-4.8 MCG/ACT AERO Inhale 2 puffs into the lungs 2 (two) times daily. 06/21/20   Tanda Rockers, MD  carbamide peroxide (DEBROX) 6.5 % OTIC solution Place 5-10 drops into both ears 3 (three) times daily as needed (ear wax).    [provider]  Carboxymethylcellulose Sod PF 0.25 % SOLN Place 1 drop into both eyes 2 (two) times daily as needed (dry eyes).    [provider]  ferrous sulfate 325 (65 FE) MG tablet Take 325 mg by mouth every Monday, Wednesday, and Friday at 6 PM.    [provider]  hydrocortisone (ANUSOL-HC) 2.5 % rectal cream Place 1 application rectally 2 (two) times daily as needed for hemorrhoids or itching.     [provider]  Insulin Syringe 27G X 1/2" 0.5 ML MISC 100 Syringes by Does not apply route 3 (three) times daily. 08/29/19   Hosie Poisson, MD  ipratropium-albuterol (DUONEB) 0.5-2.5 (3) MG/3ML SOLN Take 3 mLs by nebulization 4 (four) times daily as needed (sob/wheezing).    [provider]  Nystatin (GERHARDT'S BUTT CREAM) CREA Apply 1 application topically 2 (two) times daily. 01/10/21   Nita Sells, MD  OXYGEN Inhale 3 L/min into the lungs continuous.    [provider]  polyethylene glycol powder (GLYCOLAX/MIRALAX) 17 GM/SCOOP powder Take 17 g by mouth daily as needed for constipation. 07/03/20   [provider]  tamsulosin (FLOMAX) 0.4 MG CAPS capsule Take 1 capsule (0.4 mg total) by mouth daily. Patient taking differently: Take 0.4 mg by mouth daily after breakfast. 10/09/20   Geradine Girt, DO   Allergies  Allergen Reactions  . Isosorb Dinitrate-Hydralazine Shortness Of Breath  . Benazepril Nausea And Vomiting  . Brimonidine Other (See Comments)    Dry eyes  . Metformin Other (See Comments)    Cramps and abdominal pain  . Morphine Rash   Review of Systems  Respiratory: Positive for cough and shortness of breath.   Generalized  pain.  Physical Exam Vitals and nursing note reviewed.  Constitutional:      Appearance: He is ill-appearing.     Interventions: Nasal cannula in place.     Comments: 3L  HENT:     Head: Normocephalic and atraumatic.  Cardiovascular:     Rate and Rhythm: Normal rate.  Pulmonary:     Effort: Pulmonary effort is normal.  Psychiatric:     Comments: flat      Vital Signs: BP 104/67 (BP Location: Left Arm)   Pulse 84   Temp 99.1 F (37.3 C) (Oral)   Resp 19   Ht 6' (1.829 m)   Wt 99 kg   SpO2 94%   BMI 29.60 kg/m  Pain Scale: 0-10   Pain Score: Asleep   SpO2: SpO2: 94 % O2 Device:SpO2: 94 % O2 Flow Rate: .O2 Flow Rate (L/min): 4 L/min  IO: Intake/output summary:   Intake/Output Summary (Last 24 hours) at 01/31/2021 1550 Last data filed at 01/31/2021 1303 Gross per 24 hour  Intake 1180.9 ml  Output 3700 ml  Net -2519.1 ml    LBM: Last BM Date: 01/31/21 Baseline Weight: Weight: 110.2 kg Most recent weight: Weight: 99 kg     Palliative Assessment/Data: 30%     Time In: 1:55pm Time Out: 3:10pm Time Total: 70 minutes   Greater than 50% of this  time was spent counseling and coordinating care related to the above assessment and plan.  Signed by: Norberta Keens, PA-C Palliative Medicine Team Team phone # [phone]  Thank you for allowing the Palliative Medicine Team to assist in the care of this patient. Please utilize secure chat with additional questions, if there is no response within 30 minutes please call the above phone number.  Palliative Medicine Team providers are available by phone from 7am to 7pm daily and can be reached through the team cell phone.  Should this patient require assistance outside of these hours, please call the patient's attending physician.

## 2021-02-01 ENCOUNTER — Other Ambulatory Visit: Payer: Self-pay

## 2021-02-01 DIAGNOSIS — R0602 Shortness of breath: Secondary | ICD-10-CM

## 2021-02-01 DIAGNOSIS — Z7189 Other specified counseling: Secondary | ICD-10-CM

## 2021-02-01 DIAGNOSIS — I5043 Acute on chronic combined systolic (congestive) and diastolic (congestive) heart failure: Secondary | ICD-10-CM | POA: Diagnosis not present

## 2021-02-01 LAB — GLUCOSE, CAPILLARY
Glucose-Capillary: 178 mg/dL — ABNORMAL HIGH (ref 70–99)
Glucose-Capillary: 127 mg/dL — ABNORMAL HIGH (ref 70–99)
Glucose-Capillary: 122 mg/dL — ABNORMAL HIGH (ref 70–99)
Glucose-Capillary: 66 mg/dL — ABNORMAL LOW (ref 70–99)
Glucose-Capillary: 154 mg/dL — ABNORMAL HIGH (ref 70–99)

## 2021-02-01 LAB — BASIC METABOLIC PANEL
Anion gap: 11 (ref 5–15)
BUN: 82 mg/dL — ABNORMAL HIGH (ref 8–23)
CO2: 42 mmol/L — ABNORMAL HIGH (ref 22–32)
Calcium: 9.3 mg/dL (ref 8.9–10.3)
Chloride: 88 mmol/L — ABNORMAL LOW (ref 98–111)
Creatinine, Ser: 2.85 mg/dL — ABNORMAL HIGH (ref 0.61–1.24)
GFR, Estimated: 23 mL/min — ABNORMAL LOW (ref >60)
Glucose, Bld: 94 mg/dL (ref 70–99)
Potassium: 3.6 mmol/L (ref 3.5–5.1)
Sodium: 141 mmol/L (ref 135–145)

## 2021-02-01 LAB — MAGNESIUM: Magnesium: 2.2 mg/dL (ref 1.7–2.4)

## 2021-02-01 MED ORDER — POTASSIUM CHLORIDE CRYS ER 20 MEQ PO TBCR
40.0000 meq | EXTENDED_RELEASE_TABLET | Freq: Four times a day (QID) | ORAL | Status: AC
Start: 2021-02-01 — End: 2021-02-01
  Administered 2021-02-01 (×2): 40 meq via ORAL
  Filled 2021-02-01 (×2): qty 2

## 2021-02-01 NOTE — Progress Notes (Signed)
OT Cancellation Note  Patient Details Name: Nicholas Caldwell MRN: [id number] DOB: May 14, 1946   Cancelled Treatment:    Reason Eval/Treat Not Completed: Patient declined, no reason specified;Other (comment) pt declining OT session, not interacting much with this COTA. Education provided on importance of working with therapy to facilitate safe DC home, however pt with no reply, very flat making no effort to converse. Will continue efforts.  Harley Alto., COTA/L Acute Rehabilitation Services [phone]   Nicholas Caldwell 02/01/2021, 8:48 AM

## 2021-02-01 NOTE — Progress Notes (Signed)
Daily Progress Note   Patient Name: Nicholas Caldwell       Date: 02/01/2021 DOB: May 30, 1946  Age: 75 y.o. MRN#: [id number] Attending Physician: Barb Merino, MD Primary Care Physician: [name], MD Admit Date: 01/26/2021  Reason for Consultation/Follow-up: Establishing goals of care  Subjective: Medical records reviewed. Discussed with RN Jodell Cipro and assessed patient at the bedside. He denies pain or distress. Met along with his wife [name] at the bedside.  Patient states that he did not think much further about the MOST form that was introduced. Extensive discussion was had with [name] covering concepts specific to code status, artificial feeding and hydration, IV antibiotics, and rehospitalization.  Patient asks "why does everyone want to keep me out of the hospital if I need it?" Provided education on the disease trajectory of heart failure and negative effect that each hospitalization has on function. Pt initially asks "why do you keep saying die?" Provided clarification that the goal is still to avoid this outcome, but remain as prepared and effective as possible. Pt and wife verbalize understanding and agree to complete a MOST form. Pt is open to all interventions, with the exception of IV fluids being accepted on a time limited trial.   Discussed the option of an outpatient palliative care referral. Patient and his wife are satisfied with the care received from home health RNs, PT/OT and hesitant to accept at this time. Provided encouragement to consider this additional support, as symptom management could be provided before patient's body is so stressed that a hospitalization is warranted.  Questions and concerns addressed. Hard Choices booklet left for review. PMT will continue to  support holistically.  Length of Stay: 5  Current Medications: Scheduled Meds:  . amiodarone  200 mg Oral Daily  . apixaban  5 mg Oral BID  . arformoterol  15 mcg Nebulization BID  . atorvastatin  40 mg Oral QHS  . budesonide (PULMICORT) nebulizer solution  0.5 mg Nebulization BID  . feeding supplement (GLUCERNA SHAKE)  237 mL Oral TID BM  . guaiFENesin  600 mg Oral BID  . insulin aspart  0-15 Units Subcutaneous TID WC  . insulin aspart  0-5 Units Subcutaneous QHS  . insulin aspart  5 Units Subcutaneous TID WC  . insulin detemir  15 Units Subcutaneous BID  . ipratropium-albuterol  3  mL Nebulization BID  . latanoprost  1 drop Both Eyes QHS  . metolazone  5 mg Oral Daily  . pantoprazole  40 mg Oral BID  . potassium chloride  40 mEq Oral Q6H  . sodium chloride flush  3 mL Intravenous Q12H  . tamsulosin  0.4 mg Oral Daily  . umeclidinium bromide  1 puff Inhalation Daily    Continuous Infusions: . sodium chloride    . furosemide (LASIX) 200 mg in dextrose 5% 100 mL (53m/mL) infusion 6 mg/hr (02/01/21 0719)    PRN Meds: sodium chloride, acetaminophen, famotidine, fluticasone, ipratropium-albuterol, ondansetron (ZOFRAN) IV, oxyCODONE-acetaminophen **AND** oxyCODONE, polyvinyl alcohol, sodium chloride flush           Vital Signs: BP 109/62 (BP Location: Left Arm)   Pulse 90   Temp 98.9 F (37.2 C) (Oral)   Resp 18   Ht 6' (1.829 m)   Wt 99.2 kg   SpO2 96%   BMI 29.66 kg/m  SpO2: SpO2: 96 % O2 Device: O2 Device: Nasal Cannula O2 Flow Rate: O2 Flow Rate (L/min): 4 L/min  Intake/output summary:   Intake/Output Summary (Last 24 hours) at 02/01/2021 1252 Last data filed at 02/01/2021 1126 Gross per 24 hour  Intake 855.46 ml  Output 3100 ml  Net -2244.54 ml   LBM: Last BM Date: 02/01/21 Baseline Weight: Weight: 110.2 kg Most recent weight: Weight: 99.2 kg       Palliative Assessment/Data: 50%     Patient Active Problem List   Diagnosis Date Noted  . Uncontrolled  type 2 diabetes mellitus with hyperglycemia, with long-term current use of insulin (HRanch[place]uc[place] 01/27/2021  . Leg weakness, bilateral 01/27/2021  . Acute cardiogenic pulmonary edema (H[place] 01/26/2021  . Chronic kidney disease, stage 3b (HDenton 01/06/2021  . Severe sepsis (HEast Son[place] 12/11/2020  . Acute renal failure superimposed on chronic kidney disease (HVirginia 12/11/2020  . Abdominal pain 12/11/2020  . Stage III pressure ulcer of sacral region (HFairbury 10/01/2020  . Acute systolic CHF (congestive heart failure) (HDumas 09/25/2020  . Acute respiratory failure with hypercapnia (HMazeppa   . Goals of care, counseling/discussion   . Pressure ulcer of left buttock 08/21/2019  . Cardiac arrest (HR[place]key 08/20/2019  . Palliative care by specialist   . DNR (do not resuscitate) discussion   . Fatigue   . Acute CHF (congestive heart failure) (HBrightwood 07/07/2019  . Acute on chronic heart failure (HGreenacres 06/09/2019  . Hyperkalemia 04/10/2019  . Cerebral embolism with cerebral infarction 03/21/2019  . Gastritis and gastroduodenitis   . NSVT (nonsustained ventricular tachycardia) (HMill[place] 03/08/2019  . Tobacco abuse counseling 03/08/2019  . Iron deficiency anemia   . Acute on chronic systolic CHF (congestive heart failure) (HCrystal Rock 03/06/2019  . Mixed diabetic hyperlipidemia associated with type 2 diabetes mellitus (HEast Dunseith   . Primary osteoarthritis of right hip   . Hypomagnesemia   . Steroid-induced hyperglycemia   . Supplemental oxygen dependent   . Acute on chronic anemia   . Diabetic peripheral neuropathy (HVeblen   . Acute on chronic systolic (congestive) heart failure (HDevon   . Acute on chronic respiratory failure (HFalcon Mesa 01/14/2019  . Hypoxia   . Altered mental status   . RVF (right ventricular failure) (HHe[place] 12/30/2018  . AKI (acute kidney injury) (HRewey   . Acute respiratory failure (HBeacon Square 11/22/2018  . Acute on chronic respiratory failure with hypoxia and hypercapnia (HWilkesville 11/13/2018  . Metabolic encephalopathy 005/39/7673 .  Acute respiratory failure with hypoxia and hypercapnia (HMontgomery City 07/26/2018  . Acute  exacerbation of CHF (congestive heart failure) ([place]) 07/26/2018  . Chronic respiratory failure with hypoxia ([place]) 12/28/2017  . Acute metabolic encephalopathy   . Atrial fibrillation with RVR ([place])   . SVT (supraventricular tachycardia) ([place])   . COPD GOLD 0   . Medically noncompliant   . Panlobular emphysema ([place])   . OSA (obstructive sleep apnea)   . Pulmonary hypertension ([place])   . Disorientation   . Pressure injury of skin 09/07/2016  . Acute on chronic respiratory failure with hypoxia ([place]) 09/05/2016  . Acute on chronic combined systolic and diastolic CHF (congestive heart failure) ([place]) 09/05/2016  . Skin lesion-left heal 09/05/2016  . Chronic systolic CHF (congestive heart failure) ([place]) 07/16/2016  . COPD (chronic obstructive pulmonary disease) ([place]) 07/16/2016  . Uncontrolled type 2 diabetes mellitus with complication ([place])   . Diabetic polyneuropathy associated with diabetes mellitus due to underlying condition ([place])   . Normocytic anemia 06/29/2016  . Coronary artery disease involving native coronary artery of native heart 01/21/2016  . Nonischemic cardiomyopathy ([place]) 10/09/2015  . PAF (paroxysmal atrial fibrillation) ([place])   . Lactic acidosis 02/19/2015  . Chronic pain 02/19/2015  . Morbid obesity ([place]) 02/13/2015  . SOB (shortness of breath)   . Elevated troponin I level 11/01/2014  . COPD with acute exacerbation ([place]) 11/01/2014  . Essential hypertension 10/31/2014  . Type 2 diabetes mellitus with neuropathy 10/31/2014  . Hyperlipidemia  10/31/2014  . Cigarette smoker 10/31/2014    Palliative Care Assessment & Plan   Patient Profile: 75 y.o. male  with past medical history of systolic and diastolic congestive heart failure(last echo 12/2020 EF 45-50%),paroxysmal atrial fibrillation(S/P DCCV 12/2020 and ablation 01/10/2021), obstructive sleep apnea on CPAP, chronic respiratory  failure(on 3lpm via [place] continuous),COPD, coronary artery disease (nonobstructive via cath 12/2018),insulin-dependent diabetes mellitus type 2, pulmonary hypertension, chronic pain syndrome, benign prostatic hyperplasia, CKD3b, hyperlipidemia, and stage 2 pressure ulcer admitted on 01/26/2021 with worsening shortness of breath, acute heart failure exacerbation, and acute COPD exacerbation.  Of note, patient was recently hospitalized at [facility] from 3/5 until 3/10. Patient was managed for acute congestive heart failure with the assistance of the heart failure consultation team with intravenous diuretics.   Patient has been admitted 6 times over the past 6 months. Palliative medicine has been consulted to assist with determining goals of care.  Assessment: Acute on chronic CHF Acute COPD exacerbation Chronic respiratory failure on 3L [place] insulin-dependent diabetes mellitus type 2 obstructive sleep apnea on CPAP CKD3B  Recommendations/Plan: Continue full scope/full treatment MOST form completed; copy provided to patient and scanned into EMR with original in hard chart Ongoing support from PMT   Goals of Care and Additional Recommendations: Limitations on Scope of Treatment: Full Scope Treatment  Code Status: Full code   Code Status Orders  (From admission, onward)         Start     Ordered   01/27/21 0129  Full code  Continuous        01/27/21 0128        Code Status History    Date Active Date Inactive Code Status Order ID Comments User Context   01/06/2021 0353 01/10/2021 1930 Full Code [id number]  Vianne Bulls, MD ED   12/11/2020 1547 12/19/2020 1711 Full Code [id number]  Dwyane Dee, MD ED   11/21/2020 2246 11/28/2020 0203 Full Code [id number]  Rise Patience, MD ED   10/30/2020 0044 11/01/2020 1732 Full Code [id number]  Marcelyn Bruins, MD ED  09/25/2020 1729 10/09/2020 2245 Full Code [id number]  Debbe Odea, MD ED   08/20/2019 2328 08/29/2019 1919 Full  Code [id number]  Tyna Jaksch, MD ED   08/09/2019 1753 08/15/2019 1448 Full Code [id number]  Swayze, Ava, DO ED   07/07/2019 2201 07/22/2019 1634 Full Code [id number]  Jani Gravel, MD ED   06/08/2019 0951 06/13/2019 1735 Full Code [id number]  Karmen Bongo, MD ED   04/09/2019 2329 04/14/2019 2125 Full Code [id number]  Shela Leff, MD ED   03/06/2019 2127 03/31/2019 1927 Full Code [id number]  Etta Quill, DO ED   01/14/2019 1810 01/24/2019 1718 Full Code [id number]  Cathlyn Parsons, PA-C Inpatient   01/14/2019 1810 01/14/2019 1810 Full Code [id number]  Cathlyn Parsons, PA-C Inpatient   12/30/2018 1834 01/14/2019 1803 Full Code [id number]  Elmarie Shiley, MD Inpatient   12/01/2018 0332 12/12/2018 1627 Full Code [id number]  Corey Harold, NP ED   11/22/2018 0158 11/26/2018 1456 Full Code [id number]  Phillips Grout, MD ED   11/13/2018 0038 11/16/2018 1616 Full Code [id number]  Renee Pain, MD ED   07/26/2018 0227 08/02/2018 1647 Full Code [id number]  Hammonds, Sharyn Blitz, MD ED   07/26/2018 0023 07/26/2018 0227 Full Code [id number]  Rise Patience, MD ED   04/30/2018 2341 05/06/2018 1810 Full Code [id number]  Guadalupe Dawn, MD ED   12/13/2016 0413 12/19/2016 1939 Full Code [id number]  Dannielle Burn, MD ED   10/30/2016 0700 11/05/2016 1532 Full Code [id number]  Marshell Garfinkel, MD ED   09/05/2016 2122 09/16/2016 1834 Full Code [id number]  Ivor Costa, MD ED   07/28/2016 0216 08/04/2016 1733 Full Code [id number]  Toy Baker, MD Inpatient   06/30/2016 0249 07/04/2016 1546 Full Code [id number]  Edwin Dada, MD Inpatient   05/25/2016 2345 05/26/2016 1740 Full Code [id number]  Edwin Dada, MD Inpatient   02/19/2015 1306 02/23/2015 2020 Full Code [id number]  Wilber Oliphant, MD ED   11/06/2014 1638 11/07/2014 1535 Full Code [id number]  Jettie Booze, MD Inpatient   10/31/2014 2314 11/06/2014 1638 Full Code [id number]  Allyne Gee, MD ED   Advance Care Planning Activity    Advance  Directive Documentation   Flowsheet Row Most Recent Value  Type of Advance Directive Healthcare Power of Attorney, Living will  Pre-existing out of facility DNR order (yellow form or pink MOST form) --  "MOST" Form in Place? --     Prognosis: Poor long-term prognosis given multiple rehospitalizations, functional decline, multiple comorbidities   Discharge Planning: Home with Home Health  Thank you for allowing the Palliative Medicine Team to assist in the care of this patient.   Time In: 12:10p Time Out: 12:45p Total Time 42mnutes Prolonged Time Billed  no     Greater than 50% of this time was spent counseling and coordinating care related to the above assessment and plan.  Kenyana Husak PJohnnette Litter PA-C  Please contact Palliative Medicine Team phone at [phone] questions and concerns.

## 2021-02-01 NOTE — Progress Notes (Signed)
Patient will self place Home CPAP unit when ready.

## 2021-02-01 NOTE — Progress Notes (Addendum)
Advanced Heart Failure Rounding Note  PCP-Cardiologist: No primary care provider on file.   Subjective:    On lasix gtt at 6/hr. - 3.5L in UOP yesterday. Overall has diuresed 28 lb. Still remains fluid overloaded.   Scr improved, down from 3.05>>2.85  K 3.6  Maintaining NSR   OOB sitting up in chair. On 5L [place] c/w home baseline. No increased dyspnea. Had nausea earlier today. Didn't eat much breakfast.    Objective:   Weight Range: 99.2 kg Body mass index is 29.66 kg/m.   Vital Signs:   Temp:  [97.4 F (36.3 C)-99.1 F (37.3 C)] 98.9 F (37.2 C) (04/01 0456) Pulse Rate:  [80-90] 90 (04/01 0456) Resp:  [16-19] 18 (04/01 0456) BP: (104-125)/(55-67) 109/62 (04/01 0456) SpO2:  [90 %-96 %] 96 % (04/01 0456) Weight:  [99.2 kg] 99.2 kg (04/01 0300) Last BM Date: 02/01/21  Weight change: Filed Weights   01/30/21 0526 01/31/21 0354 02/01/21 0300  Weight: 103.8 kg 99 kg 99.2 kg    Intake/Output:   Intake/Output Summary (Last 24 hours) at 02/01/2021 1057 Last data filed at 02/01/2021 0719 Gross per 24 hour  Intake 1435.46 ml  Output 3500 ml  Net -2064.54 ml      Physical Exam    General:  Chronically ill appearing, elderly AAM. No resp difficulty HEENT: Normal Neck: Supple. JVP elevated to jaw. Carotids 2+ bilat; no bruits. No lymphadenopathy or thyromegaly appreciated. Cor: PMI nondisplaced. Regular rate & rhythm. No rubs, gallops or murmurs. Lungs: faint, diffuse expiratory wheezing  Abdomen: distended but soft, nontender No hepatosplenomegaly. No bruits or masses. Good bowel sounds. Extremities: No cyanosis, clubbing, rash, no edema Neuro: Alert & orientedx3, cranial nerves grossly intact. moves all 4 extremities w/o difficulty. Affect pleasant   Telemetry   NSR 80s   EKG    No new EKG to review   Labs    CBC No results for input(s): WBC, NEUTROABS, HGB, HCT, MCV, PLT in the last 72 hours. Basic Metabolic Panel Recent Labs    01/31/21 0539  02/01/21 0349  NA 142 141  K 3.3* 3.6  CL 89* 88*  CO2 44* 42*  GLUCOSE 138* 94  BUN 82* 82*  CREATININE 3.04* 2.85*  CALCIUM 9.5 9.3  MG  --  2.2   Liver Function Tests No results for input(s): AST, ALT, ALKPHOS, BILITOT, PROT, ALBUMIN in the last 72 hours. No results for input(s): LIPASE, AMYLASE in the last 72 hours. Cardiac Enzymes No results for input(s): CKTOTAL, CKMB, CKMBINDEX, TROPONINI in the last 72 hours.  BNP: BNP (last 3 results) Recent Labs    12/11/20 0730 01/05/21 2213 01/26/21 2142  BNP 1,858.3* 3,522.2* 3,609.9*    ProBNP (last 3 results) No results for input(s): PROBNP in the last 8760 hours.   D-Dimer No results for input(s): DDIMER in the last 72 hours. Hemoglobin A1C No results for input(s): HGBA1C in the last 72 hours. Fasting Lipid Panel No results for input(s): CHOL, HDL, LDLCALC, TRIG, CHOLHDL, LDLDIRECT in the last 72 hours. Thyroid Function Tests No results for input(s): TSH, T4TOTAL, T3FREE, THYROIDAB in the last 72 hours.  Invalid input(s): FREET3  Other results:   Imaging     No results found.   Medications:     Scheduled Medications: . amiodarone  200 mg Oral Daily  . apixaban  5 mg Oral BID  . arformoterol  15 mcg Nebulization BID  . atorvastatin  40 mg Oral QHS  . budesonide (PULMICORT) nebulizer  solution  0.5 mg Nebulization BID  . feeding supplement (GLUCERNA SHAKE)  237 mL Oral TID BM  . guaiFENesin  600 mg Oral BID  . insulin aspart  0-15 Units Subcutaneous TID WC  . insulin aspart  0-5 Units Subcutaneous QHS  . insulin aspart  5 Units Subcutaneous TID WC  . insulin detemir  15 Units Subcutaneous BID  . ipratropium-albuterol  3 mL Nebulization BID  . latanoprost  1 drop Both Eyes QHS  . metolazone  5 mg Oral Daily  . pantoprazole  40 mg Oral BID  . sodium chloride flush  3 mL Intravenous Q12H  . tamsulosin  0.4 mg Oral Daily  . umeclidinium bromide  1 puff Inhalation Daily     Infusions: . sodium  chloride    . furosemide (LASIX) 200 mg in dextrose 5% 100 mL (58m/mL) infusion 6 mg/hr (02/01/21 0719)     PRN Medications:  sodium chloride, acetaminophen, famotidine, fluticasone, ipratropium-albuterol, ondansetron (ZOFRAN) IV, oxyCODONE-acetaminophen **AND** oxyCODONE, polyvinyl alcohol, sodium chloride flush   Assessment/Plan    1. A/C Diastolic HF - TEE 29/5638LVEF mildly reduced 45-50%. RV severely reduced. BNP >3000 on admit.  - Volume status improving. Overall diuresed 28 pounds but appears to still be fluid overloaded.  - Continue lasix gtt 6/hr. Likely transition back to PO torsemide in the next 24 hrs  - SCr improving, continue to follow BMP  - No spiro/arb with elevated creatinine.   2. CKD Stage IV - Creatinine Baseline ~2.  - Creatinine on admit 3.5, today 2.85 today .  - Renal [place] no hydronephrosis.  - Continue diuresis, follow BMP   3. PAF - He has history of CVA. He had DCCV in 3/20 and again in 5/20. He failed amiodarone in the past and cannot take Tikosyn due to baseline prolonged QT interval.Underwent recent TEE/DCCV for AFL 12/17/20. Most recent DC-CV 01/10/21 with conversion to NSR EP saw last admit and he was not a candidate for ablation.  - in NSR on tele  - continue amiodarone 200 mg daily  - Continue eliquis 5 mg bid  4. Chronic Respiratory Failure, COPD + A/C CHF - ? ACOPDE given lung exam. PCT on admit 0.50. Defer management to IM  - On 3.5 liters chronically.  - Continue supp O2 + diuretics   5. OSA - On CPAP  6. CAD  - Nonobstructive disease 12/2018. No chest pain.  - Continue statin.   7. Deconditioning  - PT/OT recommending SNF.     Length of Stay: [address] PA-C  02/01/2021, 10:57 AM  Advanced Heart Failure Team Pager [phone](M-F; 7a - 5p)  Please contact [facility] for night-coverage after hours (5p -7a ) and weekends on [email or web address]  Agree with the above note.   He diuresed well again on Lasix gtt,  weight coming down.  Probably still mild volume overload.  Creatinine lower at 2.85.   Agree with continuing Lasix gtt at 6 mg/hr for one more day.  Transition to po torsemide 80 mg bid tomorrow.  Will need metolazone 2.5 mg once weekly at home.   He remains in NSR on amiodarone.   Would try again to get followup in CHF clinic. Would arrange for remote health chronic disease management after discharge.   DLoralie Champagne4/11/2020

## 2021-02-01 NOTE — Patient Outreach (Signed)
Nicholas Caldwell [facility]) Care Management  02/01/2021  [name] Dec 1946-07-06 [id number]   Date of Review: 01/18/21   Follow up Date 02/01/21  Reason:  Readmission   PCP: Dr. [name], [place] (Followed by Advanced Heart Failure Team)  Insurance: [employer]  Medical Info: [name] is a 75 yo male with PMH chronic dCHF, CKD4, afib (on Eliquis), COPD (on chronic 3-4 L O2), OHS/OSA, HTN, HLD, pulmonary HTN, DMII.   Admissions: Patient with 5 admissions in the last 6 months.    Patient admitted on 12/11/20 to 12/19/20 with abdominal distention/bloating and pain that had been going on for approximately 2 weeks.  He also had associated nausea/vomiting.  He was very lethargic on presentation. Patient diagnosed with sepsis from pneumonia, acute renal failure, acute heart failure and acute metabolic encephalopathy.  Patient refused SNF.  Discharged home with home health.    Patient admitted on 01/06/21 with shortness of breath, leg swelling, and weight gain.  Patient diagnosed with acute heart failure.  Patient underwent cardioversion with success and diuresed.  Patient again refuses SNF.  There was some mention of trying paramedicine again.  Patient discharged home with home health through [facility]. [facility] refused due to non-compliance.  Patient has also refused hospital at home program.    Disposition: Patient home with home health and CM telephonic support.  Recommendation: Inquire about services that [employer] provides.   Follow up case discussion update 02/01/21  Admitted again on 01/26/21 for CHF.  Remains hospitalized.  D/C plans back home.  Recommendation: Discussion with family about care going forward.    Jone Baseman, RN, MSN [facility] Care Management Coordinator Direct Line [phone] Cell [phone] Toll Free: [phone]  Fax: [phone]

## 2021-02-01 NOTE — Progress Notes (Signed)
Hypoglycemic Event  CBG: 66  Treatment: 1 cup of orange juice   Symptoms: Dizziness and hand tremors  Follow-up CBG: Time: 2147  CBG Result: 154  Possible Reasons for Event: Pt stated he did not receive his dinner tray during day shift. I gave the patient a [place] sandwich and applesauce upon finding out he did not eat dinner.   Comments/MD notified: No   Suzan Garibaldi

## 2021-02-01 NOTE — Consult Note (Signed)
[facility] Inpatient Consult   02/01/2021  Elaine Middleton 1946-06-10 [id number]  [place] [facility] [ACO] Patient: [employer] Medicare/[facility]  Follow up from [facility] conference that [facility] with [facility] but is not case managed by [employer].  Met with patient at the bedside.  Patient up in the chair, alert and oriented. Explained to patient this writer's role in post hospital follow up.  Patient states he is planning to go home with his wife and he has home health and his wife is suppose to come today.  He states he will need ambulance transport to return home. He states that although his wife takes him to appointments but feels that he needs transport home when he is discharged.  He states he lives with his wife who is his support person and he has home health.  Patient was discussed in unit progression meeting and inpatient [facility] team states wife is to return from [place] today.  Plan:  Will update, [facility] RN Care Management Coordinator of follow up from conference MCD meeting. Continue to follow and for any changes in disposition.  For questions,  Natividad Brood, RN BSN [facility] Liaison  [phone] business mobile phone Toll free office [phone]  Fax number: [phone] [email or web address]

## 2021-02-01 NOTE — Progress Notes (Signed)
PROGRESS NOTE    Nicholas Caldwell  QDI:[id number] DOB: [birth year] DOA: 01/26/2021 PCP: [name], MD    Brief Narrative:  74 year old gentleman with history of combined congestive heart failure, COPD, obstructive sleep apnea on CPAP at home, chronic hypoxemic respiratory failure on 3 L oxygen, pulmonary arterial hypertension, paroxysmal atrial fibrillation, type 2 diabetes on insulin, CKD stage IIIb with baseline creatinine about 2-2.2, chronic pain syndrome, hypertension who presented to the emergency room with shortness of breath and generalized weakness, PND and orthopnea.  In the emergency room, hemodynamically stable.  Complaining of shortness of breath.  Found to have fluid overload and admitted to the hospital.   Assessment & Plan:   Principal Problem:   Acute on chronic combined systolic and diastolic CHF (congestive heart failure) (HCC) Active Problems:   Essential hypertension   COPD with acute exacerbation (HCC)   SOB (shortness of breath)   Lactic acidosis   PAF (paroxysmal atrial fibrillation) (HCC)   Coronary artery disease involving native coronary artery of native heart   Acute on chronic respiratory failure with hypoxia (HCC)   OSA (obstructive sleep apnea)   Mixed diabetic hyperlipidemia associated with type 2 diabetes mellitus (HCC)   Pressure ulcer of left buttock   Goals of care, counseling/discussion   Acute renal failure superimposed on chronic kidney disease ([place])   Acute cardiogenic pulmonary edema (HCC)   Uncontrolled type 2 diabetes mellitus with hyperglycemia, with long-term current use of insulin (HCC)   Leg weakness, bilateral  Acute on chronic hypoxemic respiratory failure secondary to acute on chronic combined heart failure: Recent echocardiogram on 2/22 with ejection fraction 45 to 50% with global hypokinesis, severely reduced right ventricular systolic function and enlarged right ventricle.  On torsemide 80 mg twice a day at home. Currently on  Lasix 6 mg/h, urine output 3500 last 24 hours. 8.5 L negative balance since admission. Peripheral edema improved.  Breathing symptoms slightly improving. Followed by heart failure team. Will follow the recommendations, discharge once converted to oral diuresis.  COPD: Stable.  No evidence of exacerbation.  Breathing treatments.  Obstructive sleep apnea on BiPAP: He is noncompliant at home.  Currently using home machine.  Acute kidney injury on chronic kidney disease stage IIIb/hypokalemia due to diuresis: Exacerbated due to #1.  Reported baseline creatinine about 2.2.  We will keep patient on potassium supplements. Monitor on IV diuresis. Renal functions improving and normalizing.  Paroxysmal A. fib: Rate controlled and currently in sinus rhythm.  Status post DCCV on 01/10/2021.  Therapeutic on anticoagulation with Eliquis.  Potassium replaced.  Magnesium is adequate.  Type 2 diabetes with hyperglycemia: Continue insulin.  Blood sugar stable.  Anemia of chronic kidney disease: Hemoglobin is about 8.  Fairly stable.  Pressure injury, buttocks left stage II: Present on admission.  Barrier dressing.  Debility and frailty: Working with PT OT.  Will discharge home with PT OT.    DVT prophylaxis: Place and maintain sequential compression device Start: 01/27/21 1533 apixaban (ELIQUIS) tablet 5 mg   Code Status: Full code Family Communication: Daughter [name] on the phone 3/31. Disposition Plan: Status is: Inpatient  Remains inpatient appropriate because:IV treatments appropriate due to intensity of illness or inability to take PO and Inpatient level of care appropriate due to severity of illness   Dispo: The patient is from: Home              Anticipated d/c is to: Home with home health.  Patient currently is not medically stable to d/c.   Difficult to place patient No   Patient medically stabilizing.  Remains on Lasix infusion and followed by advanced heart failure team.   Discharge when cleared by heart failure team.      Consultants:   Palliative  Cardiology, heart failure  Procedures:   None  Antimicrobials:   None   Subjective: Patient seen and examined.  Today he looks comfortable and sitting in couch.  He is a still not very much forthcoming with conversation, however he tells me that still has congested throat but leg swelling is better.  Breathing better. He has lot of back pain problems and the reason why he is not able to talk to [place].  No family at the bedside. Patient states his wife come back home, he will need ambulance ride home whenever he is discharged.  Objective: Vitals:   01/31/21 2016 01/31/21 2121 02/01/21 0300 02/01/21 0456  BP: 125/64 (!) 106/55  109/62  Pulse: 81 80  90  Resp: _0 Temp: 98.5 F (36.9 C) (!) 97.4 F (36.3 C)  98.9 F (37.2 C)  TempSrc: Oral Oral  Oral  SpO2: 90% 93%  96%  Weight:   99.2 kg   Height:        Intake/Output Summary (Last 24 hours) at 02/01/2021 1045 Last data filed at 02/01/2021 0719 Gross per 24 hour  Intake 1435.46 ml  Output 3500 ml  Net -2064.54 ml   Filed Weights   01/30/21 0526 01/31/21 0354 02/01/21 0300  Weight: 103.8 kg 99 kg 99.2 kg    Examination:  General: Chronically sick looking, frail and debilitated gentleman sitting in chair.  On 2 L oxygen. Cardiovascular: S1-S2 normal.  He has mild expiratory crackles at the bases. Respiratory: Bilateral clear.  Scattered expiratory crackles at the bases.  No wheezing.  Conducted airway sounds. Gastrointestinal: Soft and nontender.  Bowel sounds present. Ext: No edema or cyanosis. Neuro: Generalized weakness.  No focal deficits. Flat affect.     Data Reviewed: I have personally reviewed following labs and imaging studies  CBC: Recent Labs  Lab 01/26/21 2142 01/27/21 0315 01/28/21 0420 01/29/21 0406  WBC 5.8 5.5  --  4.4  NEUTROABS 4.3 3.7  --   --   HGB 8.9* 8.2* 8.4* 8.1*  HCT 30.1* 26.8* 27.5* 26.5*   MCV 87.2 85.6  --  84.4  PLT 402* 376  --  101   Basic Metabolic Panel: Recent Labs  Lab 01/27/21 0315 01/28/21 0420 01/29/21 0406 01/30/21 0917 01/31/21 0539 02/01/21 0349  NA 137 139 141 140 142 141  K 3.4* 3.5 3.1* 3.0* 3.3* 3.6  CL 95* 94* 91* 90* 89* 88*  CO2 31 33* 39* 40* 44* 42*  GLUCOSE 127* 225* 163* 153* 138* 94  BUN 70* 73* 78* 86* 82* 82*  CREATININE 3.36* 3.26* 3.00* 3.03* 3.04* 2.85*  CALCIUM 8.4* 9.0 9.1 9.1 9.5 9.3  MG 2.1 2.2 2.3  --   --  2.2  PHOS  --  6.2* 5.6*  --   --   --    GFR: Estimated Creatinine Clearance: 28.6 mL/min (A) (by C-G formula based on SCr of 2.85 mg/dL (H)). Liver Function Tests: Recent Labs  Lab 01/26/21 2142 01/27/21 0315 01/28/21 0420 01/29/21 0406  AST 48* 19  --   --   ALT 18 12  --   --   ALKPHOS 104 88  --   --  BILITOT 1.2 0.3  --   --   PROT 6.9 6.5  --   --   ALBUMIN 2.8* 2.6* 2.6* 2.6*   No results for input(s): LIPASE, AMYLASE in the last 168 hours. No results for input(s): AMMONIA in the last 168 hours. Coagulation Profile: No results for input(s): INR, PROTIME in the last 168 hours. Cardiac Enzymes: No results for input(s): CKTOTAL, CKMB, CKMBINDEX, TROPONINI in the last 168 hours. BNP (last 3 results) No results for input(s): PROBNP in the last 8760 hours. HbA1C: No results for input(s): HGBA1C in the last 72 hours. CBG: Recent Labs  Lab 01/31/21 0610 01/31/21 1128 01/31/21 1544 01/31/21 2059 02/01/21 0603  GLUCAP 130* 112* 73 179* 178*   Lipid Profile: No results for input(s): CHOL, HDL, LDLCALC, TRIG, CHOLHDL, LDLDIRECT in the last 72 hours. Thyroid Function Tests: No results for input(s): TSH, T4TOTAL, FREET4, T3FREE, THYROIDAB in the last 72 hours. Anemia Panel: No results for input(s): VITAMINB12, FOLATE, FERRITIN, TIBC, IRON, RETICCTPCT in the last 72 hours. Sepsis Labs: Recent Labs  Lab 01/26/21 2142 01/26/21 2319 01/27/21 0315  PROCALCITON  --   --  0.50  LATICACIDVEN 2.3* 1.5   --     Recent Results (from the past 240 hour(s))  Resp Panel by RT-PCR (Flu A&B, Covid) Nasopharyngeal Swab     Status: None   Collection Time: 01/26/21 11:53 PM   Specimen: Nasopharyngeal Swab; Nasopharyngeal(NP) swabs in vial transport medium  Result Value Ref Range Status   SARS Coronavirus 2 by RT PCR NEGATIVE NEGATIVE Final    Comment: (NOTE) SARS-CoV-2 target nucleic acids are NOT DETECTED.  The SARS-CoV-2 RNA is generally detectable in upper respiratory specimens during the acute phase of infection. The lowest concentration of SARS-CoV-2 viral copies this assay can detect is 138 copies/mL. A negative result does not preclude SARS-Cov-2 infection and should not be used as the sole basis for treatment or other patient management decisions. A negative result may occur with  improper specimen collection/handling, submission of specimen other than nasopharyngeal swab, presence of viral mutation(s) within the areas targeted by this assay, and inadequate number of viral copies(<138 copies/mL). A negative result must be combined with clinical observations, patient history, and epidemiological information. The expected result is Negative.  Fact Sheet for Patients:  EntrepreneurPulse.com.au  Fact Sheet for Healthcare Providers:  IncredibleEmployment.be  This test is no t yet approved or cleared by the [place] FDA and  has been authorized for detection and/or diagnosis of SARS-CoV-2 by FDA under an Emergency Use Authorization (EUA). This EUA will remain  in effect (meaning this test can be used) for the duration of the COVID-19 declaration under Section 564(b)(1) of the Act, 21 U.S.C.section 360bbb-3(b)(1), unless the authorization is terminated  or revoked sooner.       Influenza A by PCR NEGATIVE NEGATIVE Final   Influenza B by PCR NEGATIVE NEGATIVE Final    Comment: (NOTE) The Xpert Xpress SARS-CoV-2/FLU/RSV plus assay is intended  as an aid in the diagnosis of influenza from Nasopharyngeal swab specimens and should not be used as a sole basis for treatment. Nasal washings and aspirates are unacceptable for Xpert Xpress SARS-CoV-2/FLU/RSV testing.  Fact Sheet for Patients: EntrepreneurPulse.com.au  Fact Sheet for Healthcare Providers: IncredibleEmployment.be  This test is not yet approved or cleared by the [place] FDA and has been authorized for detection and/or diagnosis of SARS-CoV-2 by FDA under an Emergency Use Authorization (EUA). This EUA will remain in effect (meaning this test can be  used) for the duration of the COVID-19 declaration under Section 564(b)(1) of the Act, 21 U.S.C. section 360bbb-3(b)(1), unless the authorization is terminated or revoked.  Performed at [facility], [place] [address] [place], [place] [address]   MRSA PCR Screening     Status: None   Collection Time: 01/27/21  1:07 AM   Specimen: Nasal Mucosa; Nasopharyngeal  Result Value Ref Range Status   MRSA by PCR NEGATIVE NEGATIVE Final    Comment:        The GeneXpert MRSA Assay (FDA approved for NASAL specimens only), is one component of a comprehensive MRSA colonization surveillance program. It is not intended to diagnose MRSA infection nor to guide or monitor treatment for MRSA infections. Performed at [facility], [place] [address] [place], [place] [address]          Radiology Studies: No results found.      Scheduled Meds: . amiodarone  200 mg Oral Daily  . apixaban  5 mg Oral BID  . arformoterol  15 mcg Nebulization BID  . atorvastatin  40 mg Oral QHS  . budesonide (PULMICORT) nebulizer solution  0.5 mg Nebulization BID  . feeding supplement (GLUCERNA SHAKE)  237 mL Oral TID BM  . guaiFENesin  600 mg Oral BID  . insulin aspart  0-15 Units Subcutaneous TID WC  . insulin aspart  0-5 Units Subcutaneous QHS  . insulin aspart  5 Units Subcutaneous TID WC   . insulin detemir  15 Units Subcutaneous BID  . ipratropium-albuterol  3 mL Nebulization BID  . latanoprost  1 drop Both Eyes QHS  . metolazone  5 mg Oral Daily  . pantoprazole  40 mg Oral BID  . sodium chloride flush  3 mL Intravenous Q12H  . tamsulosin  0.4 mg Oral Daily  . umeclidinium bromide  1 puff Inhalation Daily   Continuous Infusions: . sodium chloride    . furosemide (LASIX) 200 mg in dextrose 5% 100 mL (21m/mL) infusion 6 mg/hr (02/01/21 0719)     LOS: 5 days    Time spent: 30 minutes    KBarb Merino MD [facility] Pager [phone]

## 2021-02-02 DIAGNOSIS — I5043 Acute on chronic combined systolic (congestive) and diastolic (congestive) heart failure: Secondary | ICD-10-CM | POA: Diagnosis not present

## 2021-02-02 LAB — BASIC METABOLIC PANEL
Anion gap: 18 — ABNORMAL HIGH (ref 5–15)
BUN: 77 mg/dL — ABNORMAL HIGH (ref 8–23)
CO2: 36 mmol/L — ABNORMAL HIGH (ref 22–32)
Calcium: 9.4 mg/dL (ref 8.9–10.3)
Chloride: 85 mmol/L — ABNORMAL LOW (ref 98–111)
Creatinine, Ser: 2.88 mg/dL — ABNORMAL HIGH (ref 0.61–1.24)
GFR, Estimated: 22 mL/min — ABNORMAL LOW (ref >60)
Glucose, Bld: 119 mg/dL — ABNORMAL HIGH (ref 70–99)
Potassium: 3.5 mmol/L (ref 3.5–5.1)
Sodium: 139 mmol/L (ref 135–145)

## 2021-02-02 LAB — GLUCOSE, CAPILLARY
Glucose-Capillary: 98 mg/dL (ref 70–99)
Glucose-Capillary: 140 mg/dL — ABNORMAL HIGH (ref 70–99)
Glucose-Capillary: 158 mg/dL — ABNORMAL HIGH (ref 70–99)
Glucose-Capillary: 138 mg/dL — ABNORMAL HIGH (ref 70–99)

## 2021-02-02 MED ORDER — TORSEMIDE 20 MG PO TABS: 80.0000 mg | ORAL_TABLET | Freq: Two times a day (BID) | ORAL | Status: DC

## 2021-02-02 MED ORDER — TORSEMIDE 20 MG PO TABS
80.0000 mg | ORAL_TABLET | Freq: Two times a day (BID) | ORAL | Status: DC
  Administered 2021-02-03: 80 mg via ORAL
  Filled 2021-02-02: qty 4

## 2021-02-02 MED ORDER — GERHARDT'S BUTT CREAM
TOPICAL_CREAM | Freq: Two times a day (BID) | CUTANEOUS | Status: DC
  Administered 2021-02-02: 1 via TOPICAL
  Filled 2021-02-02: qty 1

## 2021-02-02 NOTE — Progress Notes (Signed)
Patient ID: Trevion Hoben, male   DOB: 1946/11/03, 75 y.o.   MRN: [id number]     Advanced Heart Failure Rounding Note  PCP-Cardiologist: No primary care provider on file.   Subjective:    On lasix gtt at 6/hr with metolazone.  Weight down 8 lbs (42 lbs total).  Denies dyspnea, sitting up in chair.   No labs yet today.   Maintaining NSR   Objective:   Weight Range: 95.7 kg Body mass index is 28.6 kg/m.   Vital Signs:   Temp:  [97.9 F (36.6 C)-98.4 F (36.9 C)] 98.4 F (36.9 C) (04/02 0428) Pulse Rate:  [70-89] 72 (04/02 0428) Resp:  [15-18] 16 (04/02 0428) BP: (106-111)/(57-64) 111/58 (04/02 0428) SpO2:  [91 %-99 %] 97 % (04/02 0829) Weight:  [95.7 kg] 95.7 kg (04/02 0428) Last BM Date: 02/01/21  Weight change: Filed Weights   01/31/21 0354 02/01/21 0300 02/02/21 0428  Weight: 99 kg 99.2 kg 95.7 kg    Intake/Output:   Intake/Output Summary (Last 24 hours) at 02/02/2021 1001 Last data filed at 02/02/2021 0655 Gross per 24 hour  Intake 627.15 ml  Output 3250 ml  Net -2622.85 ml      Physical Exam    General: NAD Neck: No JVD, no thyromegaly or thyroid nodule.  Lungs: Clear to auscultation bilaterally with normal respiratory effort. CV: Nondisplaced PMI.  Heart regular S1/S2, no S3/S4, no murmur.  No peripheral edema.   Abdomen: Soft, nontender, no hepatosplenomegaly, no distention.  Skin: Intact without lesions or rashes.  Neurologic: Alert and oriented x 3.  Psych: Normal affect. Extremities: No clubbing or cyanosis.  HEENT: Normal.    Telemetry   NSR 70s (personally reviewed)  Labs    CBC No results for input(s): WBC, NEUTROABS, HGB, HCT, MCV, PLT in the last 72 hours. Basic Metabolic Panel Recent Labs    01/31/21 0539 02/01/21 0349  NA 142 141  K 3.3* 3.6  CL 89* 88*  CO2 44* 42*  GLUCOSE 138* 94  BUN 82* 82*  CREATININE 3.04* 2.85*  CALCIUM 9.5 9.3  MG  --  2.2   Liver Function Tests No results for input(s): AST, ALT, ALKPHOS,  BILITOT, PROT, ALBUMIN in the last 72 hours. No results for input(s): LIPASE, AMYLASE in the last 72 hours. Cardiac Enzymes No results for input(s): CKTOTAL, CKMB, CKMBINDEX, TROPONINI in the last 72 hours.  BNP: BNP (last 3 results) Recent Labs    12/11/20 0730 01/05/21 2213 01/26/21 2142  BNP 1,858.3* 3,522.2* 3,609.9*    ProBNP (last 3 results) No results for input(s): PROBNP in the last 8760 hours.   D-Dimer No results for input(s): DDIMER in the last 72 hours. Hemoglobin A1C No results for input(s): HGBA1C in the last 72 hours. Fasting Lipid Panel No results for input(s): CHOL, HDL, LDLCALC, TRIG, CHOLHDL, LDLDIRECT in the last 72 hours. Thyroid Function Tests No results for input(s): TSH, T4TOTAL, T3FREE, THYROIDAB in the last 72 hours.  Invalid input(s): FREET3  Other results:   Imaging    No results found.   Medications:     Scheduled Medications: . amiodarone  200 mg Oral Daily  . apixaban  5 mg Oral BID  . arformoterol  15 mcg Nebulization BID  . atorvastatin  40 mg Oral QHS  . budesonide (PULMICORT) nebulizer solution  0.5 mg Nebulization BID  . feeding supplement (GLUCERNA SHAKE)  237 mL Oral TID BM  . Gerhardt's butt cream   Topical BID  . guaiFENesin  600 mg Oral BID  . insulin aspart  0-15 Units Subcutaneous TID WC  . insulin aspart  0-5 Units Subcutaneous QHS  . insulin aspart  5 Units Subcutaneous TID WC  . insulin detemir  15 Units Subcutaneous BID  . ipratropium-albuterol  3 mL Nebulization BID  . latanoprost  1 drop Both Eyes QHS  . pantoprazole  40 mg Oral BID  . sodium chloride flush  3 mL Intravenous Q12H  . tamsulosin  0.4 mg Oral Daily  . [START ON 02/03/2021] torsemide  80 mg Oral BID  . umeclidinium bromide  1 puff Inhalation Daily    Infusions: . sodium chloride      PRN Medications: sodium chloride, acetaminophen, famotidine, fluticasone, ipratropium-albuterol, ondansetron (ZOFRAN) IV, oxyCODONE-acetaminophen **AND**  oxyCODONE, polyvinyl alcohol, sodium chloride flush   Assessment/Plan   1. A/C Diastolic HF with prominent RV failure:  TEE 12/2020 LVEF mildly reduced 45-50%, RV severely reduced. BNP >3000 on admit.  He has been on Lasix gtt 6 mg/hr + metolazone daily. Down 42 lbs, looks euvolemic.  - Stop Lasix gtt and metolazone today.  - Start torsemide 80 mg bid tomorrow.  Will need metolazone 2.5 once weekly at home.  2. AKI on CKD Stage IV: Creatinine Baseline ~2. Creatinine on admit 3.5, today 2.85 yesterday. Renal US- no hydronephrosis.  - Need BMET today.  3. PAF: He has history of CVA. He had DCCV in 3/20 and again in 5/20. He cannot take Tikosyn due to baseline prolonged QT interval.Underwent TEE/DCCV for AFL 12/17/20. Most recent DC-CV 01/10/21 with conversion to NSR EP saw last admit and he was not a candidate for ablation. He remains in NSR.  - continue amiodarone 200 mg daily  - Continue eliquis 5 mg bid 4. Chronic Respiratory Failure: COPD + A/C CHF - On 3.5 liters home oxygen chronically.  5. OSA: CPAP.  6. CAD: Nonobstructive disease 12/2018 cath. No chest pain.  - Continue statin.  7. Deconditioning  - PT/OT recommending SNF.   Would try again to get followup in CHF clinic. Would arrange for remote health chronic disease management after discharge.  Will follow at a distance.   Loralie Champagne 02/02/2021

## 2021-02-02 NOTE — Progress Notes (Signed)
Patient has home CPAP for use and will self place when ready.

## 2021-02-02 NOTE — Progress Notes (Signed)
PROGRESS NOTE    Nicholas Caldwell  NTI:[id number] DOB: June 28, 1946 DOA: 01/26/2021 PCP: [name], MD    Brief Narrative:  75 year old gentleman with history of combined congestive heart failure, COPD, obstructive sleep apnea on CPAP at home, chronic hypoxemic respiratory failure on 3 L oxygen, pulmonary arterial hypertension, paroxysmal atrial fibrillation, type 2 diabetes on insulin, CKD stage IIIb with baseline creatinine about 2-2.2, chronic pain syndrome, hypertension who presented to the emergency room with shortness of breath and generalized weakness, PND and orthopnea.  In the emergency room, hemodynamically stable.  Complaining of shortness of breath.  Found to have fluid overload and admitted to the hospital. Remains in the hospital on IV diuresis and followed by advanced heart failure team.   Assessment & Plan:   Principal Problem:   Acute on chronic combined systolic and diastolic CHF (congestive heart failure) (HCC) Active Problems:   Essential hypertension   COPD with acute exacerbation (HCC)   SOB (shortness of breath)   Lactic acidosis   PAF (paroxysmal atrial fibrillation) (HCC)   Coronary artery disease involving native coronary artery of native heart   Acute on chronic respiratory failure with hypoxia (HCC)   OSA (obstructive sleep apnea)   Mixed diabetic hyperlipidemia associated with type 2 diabetes mellitus (HCC)   Pressure ulcer of left buttock   Advance care planning   Acute renal failure superimposed on chronic kidney disease ([place])   Acute cardiogenic pulmonary edema (HCC)   Uncontrolled type 2 diabetes mellitus with hyperglycemia, with long-term current use of insulin (HCC)   Leg weakness, bilateral  Acute on chronic hypoxemic respiratory failure secondary to acute on chronic combined heart failure: Recent echocardiogram on 2/22 with ejection fraction 45 to 50% with global hypokinesis, severely reduced right ventricular systolic function and enlarged right  ventricle.  On torsemide 80 mg twice a day at home. Currently on Lasix infusion. Changing to oral torsemide tomorrow.  Along with metolazone. Peripheral edema improved.  Breathing symptoms slightly improving. Followed by heart failure team. Will follow the recommendations, discharge once converted to oral diuresis.  COPD: Stable.  No evidence of exacerbation.  Breathing treatments.  Obstructive sleep apnea on BiPAP: He is noncompliant at home.  Currently using home machine.  Acute kidney injury on chronic kidney disease stage IIIb/hypokalemia due to diuresis: Exacerbated due to #1.  Reported baseline creatinine about 2.2.  We will keep patient on potassium supplements. Monitor on IV diuresis. Renal functions trending back to his normal.  Repeat pending today.  Paroxysmal A. fib: Rate controlled and currently in sinus rhythm.  Status post DCCV on 01/10/2021.  Therapeutic on anticoagulation with Eliquis.  Potassium replaced.  Magnesium is adequate.  Type 2 diabetes with hyperglycemia: Continue insulin.  Blood sugar stable.  Anemia of chronic kidney disease: Hemoglobin is about 8.  Fairly stable.  Pressure injury, buttocks left stage II: Present on admission.  Barrier dressing.  Debility and frailty: Working with PT OT.  Will discharge home with PT OT.    DVT prophylaxis: Place and maintain sequential compression device Start: 01/27/21 1533 apixaban (ELIQUIS) tablet 5 mg   Code Status: Full code Family Communication: Daughter [name] at the bedside 4/1. Disposition Plan: Status is: Inpatient  Remains inpatient appropriate because:IV treatments appropriate due to intensity of illness or inability to take PO and Inpatient level of care appropriate due to severity of illness   Dispo: The patient is from: Home              Anticipated d/c is  to: Home with home health.              Patient currently is not medically stable to d/c.   Difficult to place patient No   Patient medically  stabilizing.  Remains on Lasix infusion and followed by advanced heart failure team.  Discharge when cleared by heart failure team.      Consultants:   Palliative  Cardiology, heart failure  Procedures:   None  Antimicrobials:   None   Subjective: Patient seen and examined.  No overnight events.  He feels he is breathing better today, still has some congestion.  Back pain is better with oxycodone.  Objective: Vitals:   02/01/21 2014 02/01/21 2111 02/02/21 0428 02/02/21 0829  BP:  107/64 (!) 111/58   Pulse:  89 72   Resp:  15 16   Temp:  97.9 F (36.6 C) 98.4 F (36.9 C)   TempSrc:  Oral Oral   SpO2: 94% 97% 95% 97%  Weight:   95.7 kg   Height:        Intake/Output Summary (Last 24 hours) at 02/02/2021 1138 Last data filed at 02/02/2021 0655 Gross per 24 hour  Intake 627.15 ml  Output 2450 ml  Net -1822.85 ml   Filed Weights   01/31/21 0354 02/01/21 0300 02/02/21 0428  Weight: 99 kg 99.2 kg 95.7 kg    Examination:  General: Chronically sick looking, frail and debilitated gentleman sitting in chair.  On 3 L oxygen. Cardiovascular: S1-S2 normal.  He has mild expiratory crackles at the bases. Respiratory: Bilateral clear.  Scattered expiratory crackles at the bases.  No wheezing.  Conducted airway sounds. Gastrointestinal: Soft and nontender.  Bowel sounds present. Ext: No edema or cyanosis. Neuro: Generalized weakness.  No focal deficits. Flat affect.     Data Reviewed: I have personally reviewed following labs and imaging studies  CBC: Recent Labs  Lab 01/26/21 2142 01/27/21 0315 01/28/21 0420 01/29/21 0406  WBC 5.8 5.5  --  4.4  NEUTROABS 4.3 3.7  --   --   HGB 8.9* 8.2* 8.4* 8.1*  HCT 30.1* 26.8* 27.5* 26.5*  MCV 87.2 85.6  --  84.4  PLT 402* 376  --  226   Basic Metabolic Panel: Recent Labs  Lab 01/27/21 0315 01/28/21 0420 01/29/21 0406 01/30/21 0917 01/31/21 0539 02/01/21 0349  NA 137 139 141 140 142 141  K 3.4* 3.5 3.1* 3.0* 3.3*  3.6  CL 95* 94* 91* 90* 89* 88*  CO2 31 33* 39* 40* 44* 42*  GLUCOSE 127* 225* 163* 153* 138* 94  BUN 70* 73* 78* 86* 82* 82*  CREATININE 3.36* 3.26* 3.00* 3.03* 3.04* 2.85*  CALCIUM 8.4* 9.0 9.1 9.1 9.5 9.3  MG 2.1 2.2 2.3  --   --  2.2  PHOS  --  6.2* 5.6*  --   --   --    GFR: Estimated Creatinine Clearance: 28.1 mL/min (A) (by C-G formula based on SCr of 2.85 mg/dL (H)). Liver Function Tests: Recent Labs  Lab 01/26/21 2142 01/27/21 0315 01/28/21 0420 01/29/21 0406  AST 48* 19  --   --   ALT 18 12  --   --   ALKPHOS 104 88  --   --   BILITOT 1.2 0.3  --   --   PROT 6.9 6.5  --   --   ALBUMIN 2.8* 2.6* 2.6* 2.6*   No results for input(s): LIPASE, AMYLASE in the last 168 hours. No results  for input(s): AMMONIA in the last 168 hours. Coagulation Profile: No results for input(s): INR, PROTIME in the last 168 hours. Cardiac Enzymes: No results for input(s): CKTOTAL, CKMB, CKMBINDEX, TROPONINI in the last 168 hours. BNP (last 3 results) No results for input(s): PROBNP in the last 8760 hours. HbA1C: No results for input(s): HGBA1C in the last 72 hours. CBG: Recent Labs  Lab 02/01/21 1150 02/01/21 1649 02/01/21 2121 02/01/21 2147 02/02/21 0610  GLUCAP 127* 122* 66* 154* 98   Lipid Profile: No results for input(s): CHOL, HDL, LDLCALC, TRIG, CHOLHDL, LDLDIRECT in the last 72 hours. Thyroid Function Tests: No results for input(s): TSH, T4TOTAL, FREET4, T3FREE, THYROIDAB in the last 72 hours. Anemia Panel: No results for input(s): VITAMINB12, FOLATE, FERRITIN, TIBC, IRON, RETICCTPCT in the last 72 hours. Sepsis Labs: Recent Labs  Lab 01/26/21 2142 01/26/21 2319 01/27/21 0315  PROCALCITON  --   --  0.50  LATICACIDVEN 2.3* 1.5  --     Recent Results (from the past 240 hour(s))  Resp Panel by RT-PCR (Flu A&B, Covid) Nasopharyngeal Swab     Status: None   Collection Time: 01/26/21 11:53 PM   Specimen: Nasopharyngeal Swab; Nasopharyngeal(NP) swabs in vial transport  medium  Result Value Ref Range Status   SARS Coronavirus 2 by RT PCR NEGATIVE NEGATIVE Final    Comment: (NOTE) SARS-CoV-2 target nucleic acids are NOT DETECTED.  The SARS-CoV-2 RNA is generally detectable in upper respiratory specimens during the acute phase of infection. The lowest concentration of SARS-CoV-2 viral copies this assay can detect is 138 copies/mL. A negative result does not preclude SARS-Cov-2 infection and should not be used as the sole basis for treatment or other patient management decisions. A negative result may occur with  improper specimen collection/handling, submission of specimen other than nasopharyngeal swab, presence of viral mutation(s) within the areas targeted by this assay, and inadequate number of viral copies(<138 copies/mL). A negative result must be combined with clinical observations, patient history, and epidemiological information. The expected result is Negative.  Fact Sheet for Patients:  EntrepreneurPulse.com.au  Fact Sheet for Healthcare Providers:  IncredibleEmployment.be  This test is no t yet approved or cleared by the [place] FDA and  has been authorized for detection and/or diagnosis of SARS-CoV-2 by FDA under an Emergency Use Authorization (EUA). This EUA will remain  in effect (meaning this test can be used) for the duration of the COVID-19 declaration under Section 564(b)(1) of the Act, 21 U.S.C.section 360bbb-3(b)(1), unless the authorization is terminated  or revoked sooner.       Influenza A by PCR NEGATIVE NEGATIVE Final   Influenza B by PCR NEGATIVE NEGATIVE Final    Comment: (NOTE) The Xpert Xpress SARS-CoV-2/FLU/RSV plus assay is intended as an aid in the diagnosis of influenza from Nasopharyngeal swab specimens and should not be used as a sole basis for treatment. Nasal washings and aspirates are unacceptable for Xpert Xpress SARS-CoV-2/FLU/RSV testing.  Fact Sheet for  Patients: EntrepreneurPulse.com.au  Fact Sheet for Healthcare Providers: IncredibleEmployment.be  This test is not yet approved or cleared by the [place] FDA and has been authorized for detection and/or diagnosis of SARS-CoV-2 by FDA under an Emergency Use Authorization (EUA). This EUA will remain in effect (meaning this test can be used) for the duration of the COVID-19 declaration under Section 564(b)(1) of the Act, 21 U.S.C. section 360bbb-3(b)(1), unless the authorization is terminated or revoked.  Performed at [facility], [place] [address], [place], [place] [address]  MRSA PCR Screening     Status: None   Collection Time: 01/27/21  1:07 AM   Specimen: Nasal Mucosa; Nasopharyngeal  Result Value Ref Range Status   MRSA by PCR NEGATIVE NEGATIVE Final    Comment:        The GeneXpert MRSA Assay (FDA approved for NASAL specimens only), is one component of a comprehensive MRSA colonization surveillance program. It is not intended to diagnose MRSA infection nor to guide or monitor treatment for MRSA infections. Performed at [facility], [place] [address], [place], [place] [address]          Radiology Studies: No results found.      Scheduled Meds: . amiodarone  200 mg Oral Daily  . apixaban  5 mg Oral BID  . arformoterol  15 mcg Nebulization BID  . atorvastatin  40 mg Oral QHS  . budesonide (PULMICORT) nebulizer solution  0.5 mg Nebulization BID  . feeding supplement (GLUCERNA SHAKE)  237 mL Oral TID BM  . Gerhardt's butt cream   Topical BID  . guaiFENesin  600 mg Oral BID  . insulin aspart  0-15 Units Subcutaneous TID WC  . insulin aspart  0-5 Units Subcutaneous QHS  . insulin aspart  5 Units Subcutaneous TID WC  . insulin detemir  15 Units Subcutaneous BID  . ipratropium-albuterol  3 mL Nebulization BID  . latanoprost  1 drop Both Eyes QHS  . pantoprazole  40 mg Oral BID  . sodium chloride flush  3 mL  Intravenous Q12H  . tamsulosin  0.4 mg Oral Daily  . [START ON 02/03/2021] torsemide  80 mg Oral BID  . umeclidinium bromide  1 puff Inhalation Daily   Continuous Infusions: . sodium chloride       LOS: 6 days    Time spent: 30 minutes    Barb Merino, MD [facility] Pager [phone]

## 2021-02-03 LAB — GLUCOSE, CAPILLARY
Glucose-Capillary: 136 mg/dL — ABNORMAL HIGH (ref 70–99)
Glucose-Capillary: 236 mg/dL — ABNORMAL HIGH (ref 70–99)

## 2021-02-03 LAB — BASIC METABOLIC PANEL
Anion gap: 10 (ref 5–15)
BUN: 73 mg/dL — ABNORMAL HIGH (ref 8–23)
CO2: 41 mmol/L — ABNORMAL HIGH (ref 22–32)
Calcium: 9.1 mg/dL (ref 8.9–10.3)
Chloride: 87 mmol/L — ABNORMAL LOW (ref 98–111)
Creatinine, Ser: 2.81 mg/dL — ABNORMAL HIGH (ref 0.61–1.24)
GFR, Estimated: 23 mL/min — ABNORMAL LOW (ref >60)
Glucose, Bld: 140 mg/dL — ABNORMAL HIGH (ref 70–99)
Potassium: 3 mmol/L — ABNORMAL LOW (ref 3.5–5.1)
Sodium: 138 mmol/L (ref 135–145)

## 2021-02-03 MED ORDER — POTASSIUM CHLORIDE CRYS ER 20 MEQ PO TBCR
40.0000 meq | EXTENDED_RELEASE_TABLET | Freq: Once | ORAL | Status: AC
  Administered 2021-02-03: 40 meq via ORAL
  Filled 2021-02-03: qty 2

## 2021-02-03 MED ORDER — METOLAZONE 2.5 MG PO TABS: 2.5000 mg | ORAL_TABLET | ORAL | 0 refills | Status: AC

## 2021-02-03 MED ORDER — POTASSIUM CHLORIDE CRYS ER 20 MEQ PO TBCR: 20.0000 meq | EXTENDED_RELEASE_TABLET | Freq: Two times a day (BID) | ORAL | 2 refills | Status: AC

## 2021-02-03 MED ORDER — APIXABAN 5 MG PO TABS: 5.0000 mg | ORAL_TABLET | Freq: Two times a day (BID) | ORAL | 5 refills | Status: AC

## 2021-02-03 MED ORDER — TORSEMIDE 40 MG PO TABS: 80.0000 mg | ORAL_TABLET | Freq: Two times a day (BID) | ORAL | 2 refills | Status: DC

## 2021-02-03 MED ORDER — POTASSIUM CHLORIDE CRYS ER 20 MEQ PO TBCR
40.0000 meq | EXTENDED_RELEASE_TABLET | Freq: Three times a day (TID) | ORAL | Status: DC
  Administered 2021-02-03: 40 meq via ORAL
  Filled 2021-02-03: qty 2

## 2021-02-03 MED ORDER — AMIODARONE HCL 200 MG PO TABS: 200.0000 mg | ORAL_TABLET | Freq: Every day | ORAL | 1 refills | Status: AC

## 2021-02-03 NOTE — TOC Transition Note (Signed)
Transition of Care [facility]) - CM/SW Discharge Note   Patient Details  Name: Nicholas Caldwell MRN: [id number] Date of Birth: 09/02/1946  Transition of Care [facility]) CM/SW Contact:  Maebelle Munroe, RN Phone Number: 02/03/2021, 2:42 PM   Clinical Narrative:  [facility] team for discharge planning. Spoke to patient and wife at the bedside. Wife shares pt had [place] services prior to this hospitalization and would like to resume services. Call to [facility] to notify of resumption of care. Pt shares he needs transportation to home. PTAR arranged and pt notified. Spoke to pt about [facility] CM follow up call for post discharge. He and wfe agrees with current plan.     Final next level of care: [place] Barriers to Discharge: No Barriers Identified   Patient Goals and CMS Choice Patient states their goals for this hospitalization and ongoing recovery are:: Return home with wife      Discharge Placement                       Discharge Plan and Services                            Progress: [place] Date [place]: 02/03/21 Time [place]: 1426 Representative spoke with at [place]: [place] (SDOH) Interventions     Readmission Risk Interventions Readmission Risk Prevention Plan 01/09/2021 11/27/2020 11/23/2020  Transportation Screening Complete - Complete  PCP or Specialist Appt within 3-5 Days - Complete -  HRI or [place] - Complete -  Social Work Consult for [place] Planning/Counseling - Complete -  Palliative Care Screening - Not Applicable -  Medication Review Press photographer) Complete Complete Complete  Med Review Comments - - -  PCP or Specialist appointment within 3-5 days of discharge - - -  PCP/Specialist Appt Not Complete comments - - -  [place] or [place] Complete - Complete  SW Recovery Care/Counseling Consult Complete - Complete  Palliative Care Screening Not  Applicable - Not [place] Patient Refused - Not Applicable  Some recent data might be hidden

## 2021-02-03 NOTE — Discharge Summary (Signed)
Physician Discharge Summary  Nicholas Caldwell WJX:[id number] DOB: 02-22-1946 DOA: 01/26/2021  PCP: [name], MD  Admit date: 01/26/2021 Discharge date: 02/03/2021  Admitted From: Home Disposition: Home with home health  Recommendations for Outpatient Follow-up:  1. Follow up with PCP in 1-2 weeks 2. Please obtain BMP/CBC in one week 3. Follow-up with heart failure clinic as scheduled  Home Health: PT/OT Equipment/Devices: Oxygen available at home  Discharge Condition: Stable CODE STATUS: Full code Diet recommendation: Low-salt diet, low-carb diet  Discharge summary: 75 year old gentleman with history of chronic combined congestive heart failure, COPD, obstructive sleep apnea on CPAP at home, chronic hypoxemic respiratory failure on 3 L oxygen, pulmonary arterial hypertension, paroxysmal atrial fibrillation, type 2 diabetes on insulin, CKD stage IIIb with baseline creatinine about 2-2.2, chronic pain syndrome, hypertension who presented to the emergency room with shortness of breath and generalized weakness, PND and orthopnea.  In the emergency room, hemodynamically stable. Complaining of shortness of breath.  Found to have fluid overload and admitted to the hospital. Remained in the hospital on IV diuresis and followed by advanced heart failure team.  Stabilized and going home today.  # Acute on chronic hypoxemic respiratory failure secondary to acute on chronic combined heart failure: Recent echocardiogram on 2/22 with ejection fraction 45 to 50% with global hypokinesis, severely reduced right ventricular systolic function and enlarged right ventricle.  On torsemide 80 mg twice a day at home. Patient treated with aggressive diuresis, Lasix infusion along with metolazone.  Followed by advanced heart clinic team.  Clinically stabilized.  Euvolemic now. Discharging with torsemide 80 mg 2 times a day, potassium supplements, metolazone every week. Use oxygen at home, at baseline. Was not  compliant to nocturnal CPAP, he will be more compliant now. Diet modifications. He will not miss follow-ups at heart failure clinic now.  # COPD: Stable.  No evidence of exacerbation.  Breathing treatments.  # Obstructive sleep apnea on BiPAP: He is noncompliant at home.  Currently using home machine.  # Acute kidney injury on chronic kidney disease stage IIIb/hypokalemia due to diuresis: Exacerbated due to #1.  Reported baseline creatinine about 2.2.  We will keep patient on potassium supplements.  Renal functions gradually improved to be his baseline.  # Paroxysmal A. fib: Rate controlled and currently in sinus rhythm.  Status post DCCV on 01/10/2021.  Therapeutic on anticoagulation with Eliquis.  Potassium replaced.  Magnesium is adequate. On beta-blockers, amiodarone.  # Type 2 diabetes with hyperglycemia: Continue insulin.  Blood sugar stable.  # Anemia of chronic kidney disease: Hemoglobin is about 8.  Fairly stable.  # Pressure injury, buttocks left stage II: Present on admission.  Barrier dressing.  Debility and frailty: Working with PT OT.    Impaired mobility.  Has adequate support at home.  Wants to go home and have PT OT at home.  Prescribed home health therapies with PT OT.  Case discussed with cardiology, medication finalized.  Stable to discharge home with family.    Discharge Diagnoses:  Principal Problem:   Acute on chronic combined systolic and diastolic CHF (congestive heart failure) (HCC) Active Problems:   Essential hypertension   COPD with acute exacerbation (HCC)   SOB (shortness of breath)   Lactic acidosis   PAF (paroxysmal atrial fibrillation) (HCC)   Coronary artery disease involving native coronary artery of native heart   Acute on chronic respiratory failure with hypoxia (HCC)   OSA (obstructive sleep apnea)   Mixed diabetic hyperlipidemia associated with type 2 diabetes mellitus (  [place])   Pressure ulcer of left buttock   Advance care planning    Acute renal failure superimposed on chronic kidney disease ([place])   Acute cardiogenic pulmonary edema (HCC)   Uncontrolled type 2 diabetes mellitus with hyperglycemia, with long-term current use of insulin (HCC)   Leg weakness, bilateral    Discharge Instructions  Discharge Instructions    Call MD for:  difficulty breathing, headache or visual disturbances   Complete by: As directed    Call MD for:  extreme fatigue   Complete by: As directed    Call MD for:  persistant dizziness or light-headedness   Complete by: As directed    Diet - low sodium heart healthy   Complete by: As directed    Diet Carb Modified   Complete by: As directed    Discharge wound care:   Complete by: As directed    Cleanse skin to sacrum and left buttock with soap and water.  Silicone foam dressing. Peel back and assess every shift.  Change every three days and PRN soilage. Encourage to turn and reposition every two hours.   Increase activity slowly   Complete by: As directed      Allergies as of 02/03/2021      Reactions   Isosorb Dinitrate-hydralazine Shortness Of Breath   Benazepril Nausea And Vomiting   Brimonidine Other (See Comments)   Dry eyes   Metformin Other (See Comments)   Cramps and abdominal pain   Morphine Rash      Medication List    TAKE these medications   acetaminophen 500 MG tablet Commonly known as: TYLENOL Take 500 mg by mouth every 6 (six) hours as needed for moderate pain.   albuterol 108 (90 Base) MCG/ACT inhaler Commonly known as: VENTOLIN HFA Inhale 2 puffs into the lungs every 6 (six) hours as needed for wheezing or shortness of breath.   amiodarone 200 MG tablet Commonly known as: PACERONE Take 1 tablet (200 mg total) by mouth daily. What changed: Another medication with the same name was removed. Continue taking this medication, and follow the directions you see here.   apixaban 5 MG Tabs tablet Commonly known as: ELIQUIS Take 1 tablet (5 mg total) by mouth 2  (two) times daily. What changed: Another medication with the same name was removed. Continue taking this medication, and follow the directions you see here.   ascorbic acid 500 MG tablet Commonly known as: VITAMIN C Take 500 mg by mouth daily.   atorvastatin 40 MG tablet Commonly known as: LIPITOR Take 1 tablet (40 mg total) by mouth daily. What changed: when to take this   Breztri Aerosphere 160-9-4.8 MCG/ACT Aero Generic drug: Budeson-Glycopyrrol-Formoterol Inhale 2 puffs into the lungs 2 (two) times daily.   carbamide peroxide 6.5 % OTIC solution Commonly known as: DEBROX Place 5-10 drops into both ears 3 (three) times daily as needed (ear wax).   Carboxymethylcellulose Sod PF 0.25 % Soln Place 1 drop into both eyes 2 (two) times daily as needed (dry eyes).   famotidine 20 MG tablet Commonly known as: PEPCID TAKE 1 TABLET(20 MG) BY MOUTH TWICE DAILY What changed: See the new instructions.   feeding supplement (GLUCERNA SHAKE) Liqd Take 237 mLs by mouth 3 (three) times daily between meals.   ferrous sulfate 325 (65 FE) MG tablet Take 325 mg by mouth every Monday, Wednesday, and Friday at 6 PM.   fluticasone 50 MCG/ACT nasal spray Commonly known as: FLONASE Place 2 sprays into both  nostrils in the morning and at bedtime.   gabapentin 300 MG capsule Commonly known as: NEURONTIN Take 300 mg by mouth daily as needed (pain).   Gerhardt's butt cream Crea Apply 1 application topically 2 (two) times daily.   hydrocortisone 2.5 % rectal cream Commonly known as: ANUSOL-HC Place 1 application rectally 2 (two) times daily as needed for hemorrhoids or itching.   insulin aspart protamine- aspart (70-30) 100 UNIT/ML injection Commonly known as: NOVOLOG MIX 70/30 Inject 10 Units into the skin 2 (two) times daily with a meal.   Insulin Syringe 27G X 1/2" 0.5 ML Misc 100 Syringes by Does not apply route 3 (three) times daily.   ipratropium-albuterol 0.5-2.5 (3) MG/3ML  Soln Commonly known as: DUONEB Take 3 mLs by nebulization 4 (four) times daily as needed (sob/wheezing).   latanoprost 0.005 % ophthalmic solution Commonly known as: XALATAN Place 1 drop into both eyes at bedtime.   metolazone 2.5 MG tablet Commonly known as: ZAROXOLYN Take 1 tablet (2.5 mg total) by mouth once a week. Take 1 tablet once a week on Monday, taken 30 min prior to morning torsemide What changed: See the new instructions.   oxyCODONE-acetaminophen 10-325 MG tablet Commonly known as: PERCOCET Take 1 tablet by mouth every 6 (six) hours.   OXYGEN Inhale 3 L/min into the lungs continuous.   pantoprazole 40 MG tablet Commonly known as: PROTONIX Take 40 mg by mouth 2 (two) times daily.   polyethylene glycol powder 17 GM/SCOOP powder Commonly known as: GLYCOLAX/MIRALAX Take 17 g by mouth daily as needed for constipation.   potassium chloride SA 20 MEQ tablet Commonly known as: KLOR-CON Take 1 tablet (20 mEq total) by mouth 2 (two) times daily. Take extra 76mq (2 tablets) every Monday with metolazone What changed: additional instructions   senna-docusate 8.6-50 MG tablet Commonly known as: Senokot-S Take 1 tablet by mouth at bedtime as needed for mild constipation.   tamsulosin 0.4 MG Caps capsule Commonly known as: FLOMAX Take 1 capsule (0.4 mg total) by mouth daily. What changed: when to take this   Torsemide 40 MG Tabs Take 80 mg by mouth 2 (two) times daily. What changed:   medication strength  Another medication with the same name was removed. Continue taking this medication, and follow the directions you see here.   Vitamin D 50 MCG (2000 UT) Caps Take 2,000 Units by mouth daily.            Discharge Care Instructions  (From admission, onward)         Start     Ordered   02/03/21 0000  Discharge wound care:       Comments: Cleanse skin to sacrum and left buttock with soap and water.  Silicone foam dressing. Peel back and assess every shift.   Change every three days and PRN soilage. Encourage to turn and reposition every two hours.   02/03/21 1145          Follow-up Information    MReubin Milan MD Follow up.   Specialty: Internal Medicine Contact information: [phone]             Allergies  Allergen Reactions  . Isosorb Dinitrate-Hydralazine Shortness Of Breath  . Benazepril Nausea And Vomiting  . Brimonidine Other (See Comments)    Dry eyes  . Metformin Other (See Comments)    Cramps and abdominal pain  . Morphine Rash    Consultations:  Cardiology  Palliative  Procedures/Studies: DG Chest 2 View  Result Date: 01/05/2021 CLINICAL DATA:  Dyspnea, lower extremity edema EXAM: CHEST - 2 VIEW COMPARISON:  12/11/2020 FINDINGS: Pulmonary insufflation is stable the lungs are symmetrically expanded. Moderate left pleural effusion is present. Tiny right pleural effusion is suspected. Diffuse interstitial pulmonary infiltrate is present demonstrating a basilar predominance most in keeping with mild pulmonary edema, likely cardiogenic in nature. Mild to moderate cardiomegaly is unchanged. Central pulmonary arterial enlargement is again noted. No pneumothorax. No acute bone abnormality. IMPRESSION: Mild cardiogenic failure.  Small bilateral pleural effusions. Electronically Signed   By: Fidela Salisbury MD   On: 01/05/2021 23:09   US RENAL  Result Date: 01/27/2021 CLINICAL DATA:  75 year old male with renal failure. EXAM: RENAL / URINARY TRACT ULTRASOUND COMPLETE COMPARISON:  12/11/2020 FINDINGS: Right Kidney: Renal measurements: 10.3 x 4.7 x 5.9 cm = volume: 147 mL. Echogenicity within normal limits. No mass or hydronephrosis visualized. Left Kidney: Renal measurements: 11.1 x 6.3 x 4.8 cm = volume: 175 mL. Echogenicity within normal limits. No solid mass or hydronephrosis visualized. Two cysts within the LEFT kidney are noted, measuring 1.1 and 2 cm. Bladder: Appears normal  for degree of bladder distention. Other: None. IMPRESSION: Unremarkable kidneys except for 2 LEFT renal cysts. Electronically Signed   By: Margarette Canada M.D.   On: 01/27/2021 09:48   DG CHEST PORT 1 VIEW  Result Date: 01/30/2021 CLINICAL DATA:  Shortness of breath EXAM: PORTABLE CHEST 1 VIEW COMPARISON:  January 26, 2021 FINDINGS: There is cardiomegaly with pulmonary venous hypertension. There is airspace opacity in the left lower lobe with associated left pleural effusion. There is a degree of interstitial pulmonary edema, slightly less than on most recent study. No adenopathy evident. There is aortic atherosclerosis. No bone lesions. IMPRESSION: Cardiomegaly with pulmonary vascular congestion. Overall less interstitial edema compared to wrist recent study. Airspace opacity left lower lobe with small left pleural effusion. Suspect combination of atelectasis and potential superimposed pneumonia. No new opacity compared to most recent study. Aortic Atherosclerosis (ICD10-I70.0). Electronically Signed   By: Lowella Grip III M.D.   On: 01/30/2021 11:03   DG Chest Portable 1 View  Result Date: 01/26/2021 CLINICAL DATA:  Shortness of breath. EXAM: PORTABLE CHEST 1 VIEW COMPARISON:  Most recent radiograph 01/05/2021. Chest CT 08/09/2019 reviewed FINDINGS: Chronic cardiomegaly. Progressive pulmonary edema. Retrocardiac opacity likely combination of pleural effusion and atelectasis/airspace disease. Increased right pleural effusion from prior exam. No pneumothorax. IMPRESSION: 1. Progressive pulmonary edema and right pleural effusion from prior exam. 2. Retrocardiac opacity likely combination of pleural fluid and atelectasis/airspace disease. 3. Chronic cardiomegaly. Electronically Signed   By: Keith Rake M.D.   On: 01/26/2021 21:33   VAS [place] LOWER EXTREMITY VENOUS (DVT)  Result Date: 01/29/2021  Lower Venous DVT Study Indications: Edema, and Right leg greater than left. Other Indications: History  congestive heart failure, COPD, CAD. Performing Technologist: Oda Cogan RDMS, RVT  Examination Guidelines: A complete evaluation includes B-mode imaging, spectral Doppler, color Doppler, and power Doppler as needed of all accessible portions of each vessel. Bilateral testing is considered an integral part of a complete examination. Limited examinations for reoccurring indications may be performed as noted. The reflux portion of the exam is performed with the patient in reverse Trendelenburg.  +---------+---------------+---------+-----------+----------+--------------+ RIGHT    CompressibilityPhasicitySpontaneityPropertiesThrombus Aging +---------+---------------+---------+-----------+----------+--------------+ CFV      Full           Yes      Yes                                 +---------+---------------+---------+-----------+----------+--------------+  SFJ      Full                                                        +---------+---------------+---------+-----------+----------+--------------+ FV Prox  Full                                                        +---------+---------------+---------+-----------+----------+--------------+ FV Mid   Full                                                        +---------+---------------+---------+-----------+----------+--------------+ FV DistalFull                                                        +---------+---------------+---------+-----------+----------+--------------+ PFV      Full                                                        +---------+---------------+---------+-----------+----------+--------------+ POP      Full           Yes                                          +---------+---------------+---------+-----------+----------+--------------+ PTV      Full                                                        +---------+---------------+---------+-----------+----------+--------------+  PERO     Full                                                        +---------+---------------+---------+-----------+----------+--------------+   +---------+---------------+---------+-----------+----------+--------------+ LEFT     CompressibilityPhasicitySpontaneityPropertiesThrombus Aging +---------+---------------+---------+-----------+----------+--------------+ CFV      Full           Yes      Yes                                 +---------+---------------+---------+-----------+----------+--------------+ SFJ      Full                                                        +---------+---------------+---------+-----------+----------+--------------+  FV Prox  Full                                                        +---------+---------------+---------+-----------+----------+--------------+ FV Mid   Full                                                        +---------+---------------+---------+-----------+----------+--------------+ FV DistalFull                                                        +---------+---------------+---------+-----------+----------+--------------+ PFV      Full                                                        +---------+---------------+---------+-----------+----------+--------------+ POP      Full           Yes      Yes                                 +---------+---------------+---------+-----------+----------+--------------+ PTV      Full                                                        +---------+---------------+---------+-----------+----------+--------------+ PERO     Full                                                        +---------+---------------+---------+-----------+----------+--------------+     Summary: BILATERAL: - No evidence of deep vein thrombosis seen in the lower extremities, bilaterally. -No evidence of popliteal cyst, bilaterally.   *See table(s) above for measurements and  observations. Electronically signed by Ruta Hinds MD on 01/29/2021 at 3:13:54 PM.    Final    (Echo, Carotid, EGD, Colonoscopy, ERCP)    Subjective: Patient was seen and examined.  No overnight events.  He still has some congestion however feels much better.  Wife at the bedside.  Wondering about going home.  He had some episodes of rib pain which is probably from coughing.   Discharge Exam: Vitals:   02/03/21 0739 02/03/21 1153  BP:  118/70  Pulse:  79  Resp:  17  Temp:  98.4 F (36.9 C)  SpO2: 96% 99%   Vitals:   02/03/21 0428 02/03/21 0431 02/03/21 0739 02/03/21 1153  BP:  (!) 104/55  118/70  Pulse:  66  79  Resp:  18  17  Temp:  98.3 F (36.8  C)  98.4 F (36.9 C)  TempSrc:  Oral  Oral  SpO2:  95% 96% 99%  Weight: 94.4 kg     Height:        General: Pt is alert, awake, not in acute distress Sitting in couch.  He is on 3 L oxygen.  Patient looks chronically sick, frail and debilitated but not in any distress. Cardiovascular: RRR, S1/S2 +, no rubs, no gallops Respiratory: Poor air entry at bases, difficult to auscultate, however no added sounds. Abdominal: Soft, NT, ND, bowel sounds + Extremities: no edema, no cyanosis    The results of significant diagnostics from this hospitalization (including imaging, microbiology, ancillary and laboratory) are listed below for reference.     Microbiology: Recent Results (from the past 240 hour(s))  Resp Panel by RT-PCR (Flu A&B, Covid) Nasopharyngeal Swab     Status: None   Collection Time: 01/26/21 11:53 PM   Specimen: Nasopharyngeal Swab; Nasopharyngeal(NP) swabs in vial transport medium  Result Value Ref Range Status   SARS Coronavirus 2 by RT PCR NEGATIVE NEGATIVE Final    Comment: (NOTE) SARS-CoV-2 target nucleic acids are NOT DETECTED.  The SARS-CoV-2 RNA is generally detectable in upper respiratory specimens during the acute phase of infection. The lowest concentration of SARS-CoV-2 viral copies this assay can  detect is 138 copies/mL. A negative result does not preclude SARS-Cov-2 infection and should not be used as the sole basis for treatment or other patient management decisions. A negative result may occur with  improper specimen collection/handling, submission of specimen other than nasopharyngeal swab, presence of viral mutation(s) within the areas targeted by this assay, and inadequate number of viral copies(<138 copies/mL). A negative result must be combined with clinical observations, patient history, and epidemiological information. The expected result is Negative.  Fact Sheet for Patients:  EntrepreneurPulse.com.au  Fact Sheet for Healthcare Providers:  IncredibleEmployment.be  This test is no t yet approved or cleared by the [place] FDA and  has been authorized for detection and/or diagnosis of SARS-CoV-2 by FDA under an Emergency Use Authorization (EUA). This EUA will remain  in effect (meaning this test can be used) for the duration of the COVID-19 declaration under Section 564(b)(1) of the Act, 21 U.S.C.section 360bbb-3(b)(1), unless the authorization is terminated  or revoked sooner.       Influenza A by PCR NEGATIVE NEGATIVE Final   Influenza B by PCR NEGATIVE NEGATIVE Final    Comment: (NOTE) The Xpert Xpress SARS-CoV-2/FLU/RSV plus assay is intended as an aid in the diagnosis of influenza from Nasopharyngeal swab specimens and should not be used as a sole basis for treatment. Nasal washings and aspirates are unacceptable for Xpert Xpress SARS-CoV-2/FLU/RSV testing.  Fact Sheet for Patients: EntrepreneurPulse.com.au  Fact Sheet for Healthcare Providers: IncredibleEmployment.be  This test is not yet approved or cleared by the [place] FDA and has been authorized for detection and/or diagnosis of SARS-CoV-2 by FDA under an Emergency Use Authorization (EUA). This EUA will remain in  effect (meaning this test can be used) for the duration of the COVID-19 declaration under Section 564(b)(1) of the Act, 21 U.S.C. section 360bbb-3(b)(1), unless the authorization is terminated or revoked.  Performed at [facility], [place] [address] [place], [place] [address]   MRSA PCR Screening     Status: None   Collection Time: 01/27/21  1:07 AM   Specimen: Nasal Mucosa; Nasopharyngeal  Result Value Ref Range Status   MRSA by PCR NEGATIVE NEGATIVE Final  Comment:        The GeneXpert MRSA Assay (FDA approved for NASAL specimens only), is one component of a comprehensive MRSA colonization surveillance program. It is not intended to diagnose MRSA infection nor to guide or monitor treatment for MRSA infections. Performed at [facility], [place] [address] [place], [place] [address]      Labs: BNP (last 3 results) Recent Labs    12/11/20 0730 01/05/21 2213 01/26/21 2142  BNP 1,858.3* 3,522.2* 8,144.8*   Basic Metabolic Panel: Recent Labs  Lab 01/28/21 0420 01/29/21 0406 01/30/21 0917 01/31/21 0539 02/01/21 0349 02/02/21 0958 02/03/21 0317  NA 139 141 140 142 141 139 138  K 3.5 3.1* 3.0* 3.3* 3.6 3.5 3.0*  CL 94* 91* 90* 89* 88* 85* 87*  CO2 33* 39* 40* 44* 42* 36* 41*  GLUCOSE 225* 163* 153* 138* 94 119* 140*  BUN 73* 78* 86* 82* 82* 77* 73*  CREATININE 3.26* 3.00* 3.03* 3.04* 2.85* 2.88* 2.81*  CALCIUM 9.0 9.1 9.1 9.5 9.3 9.4 9.1  MG 2.2 2.3  --   --  2.2  --   --   PHOS 6.2* 5.6*  --   --   --   --   --    Liver Function Tests: Recent Labs  Lab 01/28/21 0420 01/29/21 0406  ALBUMIN 2.6* 2.6*   No results for input(s): LIPASE, AMYLASE in the last 168 hours. No results for input(s): AMMONIA in the last 168 hours. CBC: Recent Labs  Lab 01/28/21 0420 01/29/21 0406  WBC  --  4.4  HGB 8.4* 8.1*  HCT 27.5* 26.5*  MCV  --  84.4  PLT  --  375   Cardiac Enzymes: No results for input(s): CKTOTAL, CKMB, CKMBINDEX, TROPONINI in the last 168  hours. BNP: Invalid input(s): POCBNP CBG: Recent Labs  Lab 02/02/21 1206 02/02/21 1614 02/02/21 2120 02/03/21 0605 02/03/21 1151  GLUCAP 140* 158* 138* 136* 236*   D-Dimer No results for input(s): DDIMER in the last 72 hours. Hgb A1c No results for input(s): HGBA1C in the last 72 hours. Lipid Profile No results for input(s): CHOL, HDL, LDLCALC, TRIG, CHOLHDL, LDLDIRECT in the last 72 hours. Thyroid function studies No results for input(s): TSH, T4TOTAL, T3FREE, THYROIDAB in the last 72 hours.  Invalid input(s): FREET3 Anemia work up No results for input(s): VITAMINB12, FOLATE, FERRITIN, TIBC, IRON, RETICCTPCT in the last 72 hours. Urinalysis    Component Value Date/Time   COLORURINE YELLOW 12/11/2020 1107   APPEARANCEUR HAZY (A) 12/11/2020 1107   LABSPEC 1.020 12/11/2020 1107   PHURINE 5.5 12/11/2020 1107   GLUCOSEU NEGATIVE 12/11/2020 1107   HGBUR NEGATIVE 12/11/2020 1107   BILIRUBINUR NEGATIVE 12/11/2020 1107   KETONESUR NEGATIVE 12/11/2020 1107   PROTEINUR NEGATIVE 12/11/2020 1107   UROBILINOGEN 0.2 10/26/2014 2206   NITRITE NEGATIVE 12/11/2020 1107   LEUKOCYTESUR NEGATIVE 12/11/2020 1107   Sepsis Labs Invalid input(s): PROCALCITONIN,  WBC,  LACTICIDVEN Microbiology Recent Results (from the past 240 hour(s))  Resp Panel by RT-PCR (Flu A&B, Covid) Nasopharyngeal Swab     Status: None   Collection Time: 01/26/21 11:53 PM   Specimen: Nasopharyngeal Swab; Nasopharyngeal(NP) swabs in vial transport medium  Result Value Ref Range Status   SARS Coronavirus 2 by RT PCR NEGATIVE NEGATIVE Final    Comment: (NOTE) SARS-CoV-2 target nucleic acids are NOT DETECTED.  The SARS-CoV-2 RNA is generally detectable in upper respiratory specimens during the acute phase of infection. The lowest concentration of SARS-CoV-2 viral copies  this assay can detect is 138 copies/mL. A negative result does not preclude SARS-Cov-2 infection and should not be used as the sole basis for  treatment or other patient management decisions. A negative result may occur with  improper specimen collection/handling, submission of specimen other than nasopharyngeal swab, presence of viral mutation(s) within the areas targeted by this assay, and inadequate number of viral copies(<138 copies/mL). A negative result must be combined with clinical observations, patient history, and epidemiological information. The expected result is Negative.  Fact Sheet for Patients:  EntrepreneurPulse.com.au  Fact Sheet for Healthcare Providers:  IncredibleEmployment.be  This test is no t yet approved or cleared by the [place] FDA and  has been authorized for detection and/or diagnosis of SARS-CoV-2 by FDA under an Emergency Use Authorization (EUA). This EUA will remain  in effect (meaning this test can be used) for the duration of the COVID-19 declaration under Section 564(b)(1) of the Act, 21 U.S.C.section 360bbb-3(b)(1), unless the authorization is terminated  or revoked sooner.       Influenza A by PCR NEGATIVE NEGATIVE Final   Influenza B by PCR NEGATIVE NEGATIVE Final    Comment: (NOTE) The Xpert Xpress SARS-CoV-2/FLU/RSV plus assay is intended as an aid in the diagnosis of influenza from Nasopharyngeal swab specimens and should not be used as a sole basis for treatment. Nasal washings and aspirates are unacceptable for Xpert Xpress SARS-CoV-2/FLU/RSV testing.  Fact Sheet for Patients: EntrepreneurPulse.com.au  Fact Sheet for Healthcare Providers: IncredibleEmployment.be  This test is not yet approved or cleared by the [place] FDA and has been authorized for detection and/or diagnosis of SARS-CoV-2 by FDA under an Emergency Use Authorization (EUA). This EUA will remain in effect (meaning this test can be used) for the duration of the COVID-19 declaration under Section 564(b)(1) of the Act, 21  U.S.C. section 360bbb-3(b)(1), unless the authorization is terminated or revoked.  Performed at [facility], [place] [address] [place], [place] [address]   MRSA PCR Screening     Status: None   Collection Time: 01/27/21  1:07 AM   Specimen: Nasal Mucosa; Nasopharyngeal  Result Value Ref Range Status   MRSA by PCR NEGATIVE NEGATIVE Final    Comment:        The GeneXpert MRSA Assay (FDA approved for NASAL specimens only), is one component of a comprehensive MRSA colonization surveillance program. It is not intended to diagnose MRSA infection nor to guide or monitor treatment for MRSA infections. Performed at [facility], [place] [address], [place],  [place] [address]      Time coordinating discharge: 35 minutes  SIGNED:   Barb Merino, MD  [facility] 02/03/2021, 12:50 PM

## 2021-02-03 NOTE — Plan of Care (Signed)

## 2021-02-03 NOTE — Progress Notes (Signed)
Discussed with Dr. Haroldine Laws, ok to discharge from heart failure perspective after potassium repletion.   Patient will be discharged on following cardiac medications:  Torsemide 88m BID (previous home dose)  Metolazone 2.555monce weekly (every Monday, taken 3058mprior to the morning torsemide)  Potassium chloride 46m61mID with additional 40me47m the day he takes metolazone.   Amiodarone 200mg 54my  Eliquis 5mg BI84mLipitor 40mg da76m  Emphasize on compliance with follow up  Signed, Kayhan Boardley MeAlmyra Deforestr: [phone]

## 2021-02-04 ENCOUNTER — Other Ambulatory Visit: Payer: Self-pay

## 2021-02-04 NOTE — Patient Instructions (Signed)
Goals    . Increase My Muscle Strength     Timeframe:  Short-Term Goal Priority:  High Start Date:   01/11/21                          Expected End Date:    03/02/21                   Follow Up Date3/31/22   - do the exercise that the therapist taught me    Why is this important?    Walking and easy exercises make you stronger. These also give you energy.   Every little bit helps, at any age.    Notes: 01/11/21 Discussed importance of participation with home health.   01/16/21 patient participating with therapy.    . Make and Keep All Appointments     Timeframe:  Long-Range Goal Priority:  High Start Date:   01/11/21                          Expected End Date:    07/03/21                   Follow Up Date 03/02/21   - call to cancel if needed    Why is this important?    Part of staying healthy is seeing the doctor for follow-up care.   If you forget your appointments, there are some things you can do to stay on track.    Notes: 01/11/21 Discussed importance of follow up appointments. 01/16/21 patient has up coming appointments.   01/24/21 patient has upcoming heart appointment on 01-31-21  02/04/21 Patient has PCP follow up on 02/13/21    . Track and Manage Fluids and Swelling-Heart Failure     Timeframe:  Long-Range Goal Priority:  High Start Date:       01/11/21                      Expected End Date:   07/03/21                    Follow Up Date 03/02/21   - call office if I gain more than 2 pounds in one day or 5 pounds in one week - use salt in moderation - watch for swelling in feet, ankles and legs every day    Why is this important?    It is important to check your weight daily and watch how much salt and liquids you have.   It will help you to manage your heart failure.    Notes: 01/11/21 Discussed importance of daily weights and notifying physician for increases.  01/16/21 patient weight 223 lbs. Patient adhering to guidance.   01/24/21 Discussed notifying heart  failure clinic of weight changes.  02/04/21 Reinforced heart failure management and timely follow up.      . Track and Manage Symptoms-Heart Failure     Timeframe:  Long-Range Goal Priority:  High Start Date:   01/11/21                          Expected End Date:    07/03/21                   Follow Up Date 03/02/21   - track symptoms and what helps feel better or worse  Why is this important?    You will be able to handle your symptoms better if you keep track of them.   Making some simple changes to your lifestyle will help.   Eating healthy is one thing you can do to take good care of yourself.    Notes: 01/11/21 Discussed importance of heart failure regimen. 01/16/21 reiterated heart failure management. Patient weight 223 lbs.  01/24/21 Wife unable to give last weight on call.

## 2021-02-04 NOTE — Patient Outreach (Signed)
[place]) Care Management  [place]  02/04/2021   Nicholas Caldwell [birth year] [id number]  Subjective: Telephone call to wife Nicholas Caldwell for follow up.  Discussed patient recent discharge.  She reports that patient is doing good. Patient able to stand some now to weigh. Patient has not weighed today.  Discussed importance of weight and an indicator for heart failure exacerbation.  She verbalized understanding. She states patient has follow up with the PCP next week and denies problems with transportation.  Discussed patient care needs she denies any problems presently.   Objective:   Encounter Medications:  Outpatient Encounter Medications as of 02/04/2021  Medication Sig Note  . acetaminophen (TYLENOL) 500 MG tablet Take 500 mg by mouth every 6 (six) hours as needed for moderate pain.   Marland Kitchen albuterol (VENTOLIN HFA) 108 (90 Base) MCG/ACT inhaler Inhale 2 puffs into the lungs every 6 (six) hours as needed for wheezing or shortness of breath. 12/11/2020: LF 11/15/2020 90 DS  . amiodarone (PACERONE) 200 MG tablet Take 1 tablet (200 mg total) by mouth daily.   Marland Kitchen apixaban (ELIQUIS) 5 MG TABS tablet Take 1 tablet (5 mg total) by mouth 2 (two) times daily.   Marland Kitchen ascorbic acid (VITAMIN C) 500 MG tablet Take 500 mg by mouth daily.   Marland Kitchen atorvastatin (LIPITOR) 40 MG tablet Take 1 tablet (40 mg total) by mouth daily. (Patient taking differently: Take 40 mg by mouth at bedtime.)   . Budeson-Glycopyrrol-Formoterol (BREZTRI AEROSPHERE) 160-9-4.8 MCG/ACT AERO Inhale 2 puffs into the lungs 2 (two) times daily.   . carbamide peroxide (DEBROX) 6.5 % OTIC solution Place 5-10 drops into both ears 3 (three) times daily as needed (ear wax).   . Carboxymethylcellulose Sod PF 0.25 % SOLN Place 1 drop into both eyes 2 (two) times daily as needed (dry eyes).   . Cholecalciferol (VITAMIN D) 50 MCG (2000 UT) CAPS Take 2,000 Units by mouth daily.   . famotidine (PEPCID) 20 MG tablet TAKE 1 TABLET(20 MG) BY  MOUTH TWICE DAILY (Patient taking differently: Take 20 mg by mouth 2 (two) times daily as needed for indigestion.)   . feeding supplement, GLUCERNA SHAKE, (GLUCERNA SHAKE) LIQD Take 237 mLs by mouth 3 (three) times daily between meals.   . ferrous sulfate 325 (65 FE) MG tablet Take 325 mg by mouth every Monday, Wednesday, and Friday at 6 PM.   . fluticasone (FLONASE) 50 MCG/ACT nasal spray Place 2 sprays into both nostrils in the morning and at bedtime.   . gabapentin (NEURONTIN) 300 MG capsule Take 300 mg by mouth daily as needed (pain).   . hydrocortisone (ANUSOL-HC) 2.5 % rectal cream Place 1 application rectally 2 (two) times daily as needed for hemorrhoids or itching.    . insulin aspart protamine- aspart (NOVOLOG MIX 70/30) (70-30) 100 UNIT/ML injection Inject 10 Units into the skin 2 (two) times daily with a meal.   . Insulin Syringe 27G X 1/2" 0.5 ML MISC 100 Syringes by Does not apply route 3 (three) times daily.   Marland Kitchen ipratropium-albuterol (DUONEB) 0.5-2.5 (3) MG/3ML SOLN Take 3 mLs by nebulization 4 (four) times daily as needed (sob/wheezing).   Marland Kitchen latanoprost (XALATAN) 0.005 % ophthalmic solution Place 1 drop into both eyes at bedtime.   . metolazone (ZAROXOLYN) 2.5 MG tablet Take 1 tablet (2.5 mg total) by mouth once a week. Take 1 tablet once a week on Monday, taken 30 min prior to morning torsemide   . Nystatin (GERHARDT'S BUTT CREAM) CREA  Apply 1 application topically 2 (two) times daily.   Marland Kitchen oxyCODONE-acetaminophen (PERCOCET) 10-325 MG tablet Take 1 tablet by mouth every 6 (six) hours.   . OXYGEN Inhale 3 L/min into the lungs continuous.   . pantoprazole (PROTONIX) 40 MG tablet Take 40 mg by mouth 2 (two) times daily.   . polyethylene glycol powder (GLYCOLAX/MIRALAX) 17 GM/SCOOP powder Take 17 g by mouth daily as needed for constipation.   . potassium chloride SA (KLOR-CON) 20 MEQ tablet Take 1 tablet (20 mEq total) by mouth 2 (two) times daily. Take extra 34mq (2 tablets) every Monday  with metolazone   . senna-docusate (SENOKOT-S) 8.6-50 MG tablet Take 1 tablet by mouth at bedtime as needed for mild constipation.   . tamsulosin (FLOMAX) 0.4 MG CAPS capsule Take 1 capsule (0.4 mg total) by mouth daily. (Patient taking differently: Take 0.4 mg by mouth daily after breakfast.)   . torsemide 40 MG TABS Take 80 mg by mouth 2 (two) times daily.    No facility-administered encounter medications on file as of 02/04/2021.    Functional Status:  In your present state of health, do you have any difficulty performing the following activities: 02/03/2021 01/28/2021  Hearing? - N  Vision? - N  Difficulty concentrating or making decisions? - N  Walking or climbing stairs? - Y  Comment - -  Dressing or bathing? - Y  Comment - -  Doing errands, shopping? N Y  Comment - wife assists him with needs  Preparing Food and eating ? - -  Using the Toilet? - -  In the past six months, have you accidently leaked urine? - -  Do you have problems with loss of bowel control? - -  Managing your Medications? - -  Comment - -  Managing your Finances? - -  Comment - -  Housekeeping or managing your Housekeeping? - -  Comment - -  Some recent data might be hidden    Fall/Depression Screening: Fall Risk  01/11/2021 12/26/2016  Falls in the past year? 0 Yes  Number falls in past yr: - 2 or more  Injury with Fall? - Yes  Risk Factor Category  - High Fall Risk  Risk for fall due to : - History of fall(s);Impaired balance/gait;Impaired mobility;Impaired vision  Follow up - Follow up appointment   [facility] 01/11/2021 12/26/2016  PHQ - 2 Score - 0  Exception Documentation Other- indicate reason in comment box -  Not completed spoke -    Assessment:  Goals Addressed            This Visit's Progress   . Make and Keep All Appointments   On track    Timeframe:  Long-Range Goal Priority:  High Start Date:   01/11/21                          Expected End Date:    07/03/21                    Follow Up Date 03/02/21   - call to cancel if needed    Why is this important?    Part of staying healthy is seeing the doctor for follow-up care.   If you forget your appointments, there are some things you can do to stay on track.    Notes: 01/11/21 Discussed importance of follow up appointments. 01/16/21 patient has up coming appointments.   01/24/21 patient has upcoming  heart appointment on 01-31-21  02/04/21 Patient has PCP follow up on 02/13/21    . Track and Manage Fluids and Swelling-Heart Failure   On track    Timeframe:  Long-Range Goal Priority:  High Start Date:       01/11/21                      Expected End Date:   07/03/21                    Follow Up Date 03/02/21   - call office if I gain more than 2 pounds in one day or 5 pounds in one week - use salt in moderation - watch for swelling in feet, ankles and legs every day    Why is this important?    It is important to check your weight daily and watch how much salt and liquids you have.   It will help you to manage your heart failure.    Notes: 01/11/21 Discussed importance of daily weights and notifying physician for increases.  01/16/21 patient weight 223 lbs. Patient adhering to guidance.   01/24/21 Discussed notifying heart failure clinic of weight changes.  02/04/21 Reinforced heart failure management and timely follow up.      . Track and Manage Symptoms-Heart Failure   On track    Timeframe:  Long-Range Goal Priority:  High Start Date:   01/11/21                          Expected End Date:    07/03/21                   Follow Up Date 03/02/21   - track symptoms and what helps feel better or worse    Why is this important?    You will be able to handle your symptoms better if you keep track of them.   Making some simple changes to your lifestyle will help.   Eating healthy is one thing you can do to take good care of yourself.    Notes: 01/11/21 Discussed importance of heart failure regimen. 01/16/21  reiterated heart failure management. Patient weight 223 lbs.  01/24/21 Wife unable to give last weight on call.         Plan: RN CM will contact again this month.   Follow-up:  Patient agrees to Care Plan and Follow-up.   Jone Baseman, RN, MSN [facility] Care Management Coordinator Direct Line [phone] Cell [phone] Toll Free: [phone]  Fax: [phone]

## 2021-02-08 ENCOUNTER — Telehealth (HOSPITAL_COMMUNITY): Payer: Self-pay | Admitting: *Deleted

## 2021-02-08 DIAGNOSIS — D631 Anemia in chronic kidney disease: Secondary | ICD-10-CM | POA: Diagnosis not present

## 2021-02-08 DIAGNOSIS — N1832 Chronic kidney disease, stage 3b: Secondary | ICD-10-CM | POA: Diagnosis not present

## 2021-02-08 DIAGNOSIS — I50811 Acute right heart failure: Secondary | ICD-10-CM | POA: Diagnosis not present

## 2021-02-08 DIAGNOSIS — I4892 Unspecified atrial flutter: Secondary | ICD-10-CM | POA: Diagnosis not present

## 2021-02-08 DIAGNOSIS — I480 Paroxysmal atrial fibrillation: Secondary | ICD-10-CM | POA: Diagnosis not present

## 2021-02-08 DIAGNOSIS — E1122 Type 2 diabetes mellitus with diabetic chronic kidney disease: Secondary | ICD-10-CM | POA: Diagnosis not present

## 2021-02-08 DIAGNOSIS — L89312 Pressure ulcer of right buttock, stage 2: Secondary | ICD-10-CM | POA: Diagnosis not present

## 2021-02-08 DIAGNOSIS — I130 Hypertensive heart and chronic kidney disease with heart failure and stage 1 through stage 4 chronic kidney disease, or unspecified chronic kidney disease: Secondary | ICD-10-CM | POA: Diagnosis not present

## 2021-02-08 DIAGNOSIS — I5043 Acute on chronic combined systolic (congestive) and diastolic (congestive) heart failure: Secondary | ICD-10-CM | POA: Diagnosis not present

## 2021-02-08 NOTE — Telephone Encounter (Signed)
   Called [facility] nurse  regarding message.  No answer at this number [phone]  Please call. Can hold pm torsemide then restart on 02/09/21 . Advise to follow up next week for post hospital follow up.   Arnold Kester 3:32 PM

## 2021-02-08 NOTE — Telephone Encounter (Signed)
Home health saw pt today and his bp was 90/54 heart rate 57 and he felt a little dizzy. Pt has an office visit scheduled 4/15. HHRN asks if we want to make medication changes.   Callback # [phone]  Routed to [name],NP-c

## 2021-02-08 NOTE — Telephone Encounter (Signed)
Spoke with home health [facility]. She is aware and will follow up with patient.

## 2021-02-11 ENCOUNTER — Telehealth (HOSPITAL_COMMUNITY): Payer: Self-pay | Admitting: *Deleted

## 2021-02-11 DIAGNOSIS — L89152 Pressure ulcer of sacral region, stage 2: Secondary | ICD-10-CM | POA: Diagnosis not present

## 2021-02-11 DIAGNOSIS — I130 Hypertensive heart and chronic kidney disease with heart failure and stage 1 through stage 4 chronic kidney disease, or unspecified chronic kidney disease: Secondary | ICD-10-CM | POA: Diagnosis not present

## 2021-02-11 DIAGNOSIS — D631 Anemia in chronic kidney disease: Secondary | ICD-10-CM | POA: Diagnosis not present

## 2021-02-11 DIAGNOSIS — E1122 Type 2 diabetes mellitus with diabetic chronic kidney disease: Secondary | ICD-10-CM | POA: Diagnosis not present

## 2021-02-11 DIAGNOSIS — I4892 Unspecified atrial flutter: Secondary | ICD-10-CM | POA: Diagnosis not present

## 2021-02-11 DIAGNOSIS — N1832 Chronic kidney disease, stage 3b: Secondary | ICD-10-CM | POA: Diagnosis not present

## 2021-02-11 DIAGNOSIS — I480 Paroxysmal atrial fibrillation: Secondary | ICD-10-CM | POA: Diagnosis not present

## 2021-02-11 DIAGNOSIS — I50813 Acute on chronic right heart failure: Secondary | ICD-10-CM | POA: Diagnosis not present

## 2021-02-11 DIAGNOSIS — I5043 Acute on chronic combined systolic (congestive) and diastolic (congestive) heart failure: Secondary | ICD-10-CM | POA: Diagnosis not present

## 2021-02-11 NOTE — Telephone Encounter (Signed)
[facility] called today to report pt still c/o dizziness at rest. bp 112/62 heart rate 67.  Pt has a nose bleed that has been on and off since RN resumption of care. Pt spent the entire RN visit laying down because of dizziness. Pt did not let RN assess wounds or any other care.Pt has office visit Friday. RN asks if we want to make any changes before then.  Routed to [facility] for advice.

## 2021-02-12 NOTE — Telephone Encounter (Signed)
If nose bleeds and dizziness persist, would recommend checking CBC and BMP. If feeling better can wait until Friday for office visit.

## 2021-02-13 ENCOUNTER — Other Ambulatory Visit: Payer: Self-pay

## 2021-02-13 ENCOUNTER — Emergency Department (HOSPITAL_COMMUNITY)
Admission: EM | Admit: 2021-02-13 | Discharge: 2021-02-14 | Disposition: A | Discharge status: Home / Self Care | Payer: No Typology Code available for payment source | Attending: Emergency Medicine | Admitting: Emergency Medicine

## 2021-02-13 DIAGNOSIS — I2510 Atherosclerotic heart disease of native coronary artery without angina pectoris: Secondary | ICD-10-CM | POA: Insufficient documentation

## 2021-02-13 DIAGNOSIS — N3001 Acute cystitis with hematuria: Secondary | ICD-10-CM | POA: Insufficient documentation

## 2021-02-13 DIAGNOSIS — Z87891 Personal history of nicotine dependence: Secondary | ICD-10-CM | POA: Diagnosis not present

## 2021-02-13 DIAGNOSIS — E1122 Type 2 diabetes mellitus with diabetic chronic kidney disease: Secondary | ICD-10-CM | POA: Insufficient documentation

## 2021-02-13 DIAGNOSIS — E876 Hypokalemia: Secondary | ICD-10-CM | POA: Insufficient documentation

## 2021-02-13 DIAGNOSIS — J449 Chronic obstructive pulmonary disease, unspecified: Secondary | ICD-10-CM | POA: Diagnosis not present

## 2021-02-13 DIAGNOSIS — Z7951 Long term (current) use of inhaled steroids: Secondary | ICD-10-CM | POA: Diagnosis not present

## 2021-02-13 DIAGNOSIS — I5022 Chronic systolic (congestive) heart failure: Secondary | ICD-10-CM | POA: Insufficient documentation

## 2021-02-13 DIAGNOSIS — I130 Hypertensive heart and chronic kidney disease with heart failure and stage 1 through stage 4 chronic kidney disease, or unspecified chronic kidney disease: Secondary | ICD-10-CM | POA: Diagnosis not present

## 2021-02-13 DIAGNOSIS — Z7901 Long term (current) use of anticoagulants: Secondary | ICD-10-CM | POA: Diagnosis not present

## 2021-02-13 DIAGNOSIS — N1832 Chronic kidney disease, stage 3b: Secondary | ICD-10-CM | POA: Insufficient documentation

## 2021-02-13 DIAGNOSIS — N179 Acute kidney failure, unspecified: Secondary | ICD-10-CM | POA: Diagnosis not present

## 2021-02-13 DIAGNOSIS — E119 Type 2 diabetes mellitus without complications: Secondary | ICD-10-CM | POA: Insufficient documentation

## 2021-02-13 DIAGNOSIS — R197 Diarrhea, unspecified: Secondary | ICD-10-CM | POA: Diagnosis present

## 2021-02-13 DIAGNOSIS — Z794 Long term (current) use of insulin: Secondary | ICD-10-CM | POA: Insufficient documentation

## 2021-02-13 LAB — MAGNESIUM: Magnesium: 2 mg/dL (ref 1.7–2.4)

## 2021-02-13 LAB — CBC WITH DIFFERENTIAL/PLATELET
Abs Immature Granulocytes: 0.07 10*3/uL (ref 0.00–0.07)
Basophils Absolute: 0 10*3/uL (ref 0.0–0.1)
Basophils Relative: 0 %
Eosinophils Absolute: 0.2 10*3/uL (ref 0.0–0.5)
Eosinophils Relative: 1 %
HCT: 29.8 % — ABNORMAL LOW (ref 39.0–52.0)
Hemoglobin: 9.1 g/dL — ABNORMAL LOW (ref 13.0–17.0)
Immature Granulocytes: 1 %
Lymphocytes Relative: 5 %
Lymphs Abs: 0.6 10*3/uL — ABNORMAL LOW (ref 0.7–4.0)
MCH: 26.2 pg (ref 26.0–34.0)
MCHC: 30.5 g/dL (ref 30.0–36.0)
MCV: 85.9 fL (ref 80.0–100.0)
Monocytes Absolute: 2 10*3/uL — ABNORMAL HIGH (ref 0.1–1.0)
Monocytes Relative: 16 %
Neutro Abs: 9.5 10*3/uL — ABNORMAL HIGH (ref 1.7–7.7)
Neutrophils Relative %: 77 %
Platelets: 345 10*3/uL (ref 150–400)
RBC: 3.47 MIL/uL — ABNORMAL LOW (ref 4.22–5.81)
RDW: 18.7 % — ABNORMAL HIGH (ref 11.5–15.5)
WBC: 12.2 10*3/uL — ABNORMAL HIGH (ref 4.0–10.5)
nRBC: 0 % (ref 0.0–0.2)

## 2021-02-13 LAB — BASIC METABOLIC PANEL
Anion gap: 11 (ref 5–15)
BUN: 108 mg/dL — ABNORMAL HIGH (ref 8–23)
CO2: 32 mmol/L (ref 22–32)
Calcium: 8.2 mg/dL — ABNORMAL LOW (ref 8.9–10.3)
Chloride: 92 mmol/L — ABNORMAL LOW (ref 98–111)
Creatinine, Ser: 3.51 mg/dL — ABNORMAL HIGH (ref 0.61–1.24)
GFR, Estimated: 18 mL/min — ABNORMAL LOW (ref >60)
Glucose, Bld: 180 mg/dL — ABNORMAL HIGH (ref 70–99)
Potassium: 2.7 mmol/L — CL (ref 3.5–5.1)
Sodium: 135 mmol/L (ref 135–145)

## 2021-02-13 LAB — URINALYSIS, ROUTINE W REFLEX MICROSCOPIC
Bilirubin Urine: NEGATIVE
Glucose, UA: 50 mg/dL — AB
Ketones, ur: NEGATIVE mg/dL
Nitrite: NEGATIVE
Protein, ur: 100 mg/dL — AB
RBC / HPF: >50 RBC/hpf — ABNORMAL HIGH (ref 0–5)
Specific Gravity, Urine: 1.011 (ref 1.005–1.030)
WBC, UA: >50 WBC/hpf — ABNORMAL HIGH (ref 0–5)
pH: 5 (ref 5.0–8.0)

## 2021-02-13 MED ORDER — POTASSIUM CHLORIDE CRYS ER 20 MEQ PO TBCR
40.0000 meq | EXTENDED_RELEASE_TABLET | ORAL | Status: AC
  Administered 2021-02-13 – 2021-02-14 (×2): 40 meq via ORAL
  Filled 2021-02-13 (×2): qty 2

## 2021-02-13 MED ORDER — POTASSIUM CHLORIDE 10 MEQ/100ML IV SOLN
10.0000 meq | INTRAVENOUS | Status: AC
  Administered 2021-02-14 (×2): 10 meq via INTRAVENOUS
  Filled 2021-02-13 (×2): qty 100

## 2021-02-13 MED ORDER — CEPHALEXIN 250 MG PO CAPS
1000.0000 mg | ORAL_CAPSULE | Freq: Once | ORAL | Status: AC
  Administered 2021-02-13: 1000 mg via ORAL
  Filled 2021-02-13 (×2): qty 4

## 2021-02-13 NOTE — ED Provider Notes (Signed)
[facility] EMERGENCY DEPARTMENT Provider Note   CSN: [id number] Arrival date & time: 02/13/21  1841     History Chief Complaint  Patient presents with  . GI Bleeding    Nicholas Caldwell is a 75 y.o. male.  74 yo M with a chief complaint of dark urine and frequent bowel movements.  This been going on for couple days.  Had had some lower abdominal pain just prior to a bowel movement couple days ago but has resolved.  Felt like his stool looked somewhat dark.  Felt mildly weak.  Denies any chest pain or shortness of breath.  Denies any difficulty urinating.  Denies pain with urination.  The history is provided by the patient.  Illness Severity:  Moderate Onset quality:  Gradual Duration:  2 days Timing:  Constant Progression:  Worsening Chronicity:  New Associated symptoms: diarrhea   Associated symptoms: no abdominal pain, no chest pain, no congestion, no fever, no headaches, no myalgias, no rash, no shortness of breath and no vomiting        Past Medical History:  Diagnosis Date  . Atrial flutter ([place])    a. recurrent AFlutter with RVR;  b. Amiodarone Rx started 4/16  . CAD (coronary artery disease)    a. LHC 1/16:  mLAD diffuse disease, pLCx mild disease, dLCx with disease but too small for PCI, RCA ok, EF 25-30%  . Chronic pain   . Chronic systolic CHF (congestive heart failure) ([place])   . COPD (chronic obstructive pulmonary disease) ([place])   . Diabetes mellitus without complication ([place])   . Ex-smoker 11/2017  . Hypercholesteremia   . Hypertension   . NICM (nonischemic cardiomyopathy) ([place])    a.dx 2016. b. 2D echo 06/2016 - Last echo 07/01/16: mod dilated LV, mod LVH, EF 25-30%, mild-mod MR, sev LAE, mild-mod reduced RV systolic function, mild-mod TR, PASP 70mHG.  .Marland KitchenPAF (paroxysmal atrial fibrillation) (HCC)    On amio - ot a candidate for flecainide due to cardiomyopathy, not a candidate for Tikosyn due to prolonged QT, and felt to be a poor candidate  for ablation given left atrial size.  . Pulmonary hypertension ([place] Wend[place]   . Sleep apnea   . Tobacco abuse     Patient Active Problem List   Diagnosis Date Noted  . Uncontrolled type 2 diabetes mellitus with hyperglycemia, with long-term current use of insulin ([place] 01/27/2021  . Leg weakness, bilateral 01/27/2021  . Acute cardiogenic pulmonary edema (HRos[place] 01/26/2021  . Chronic kidney disease, stage 3b (HSummertown 01/06/2021  . Severe sepsis (HH[place] 12/11/2020  . Acute renal failure superimposed on chronic kidney disease (HCape May 12/11/2020  . Abdominal pain 12/11/2020  . Stage III pressure ulcer of sacral region (HCentralia 10/01/2020  . Acute systolic CHF (congestive heart failure) (HWh[place] 09/25/2020  . Acute respiratory failure with hypercapnia (HFriesland   . Advance care planning   . Pressure ulcer of left buttock 08/21/2019  . Cardiac arrest (HNorton 08/20/2019  . Palliative care by specialist   . DNR (do not resuscitate) discussion   . Fatigue   . Acute CHF (congestive heart failure) (HIngleside 07/07/2019  . Acute on chronic heart failure (HHenderson 06/09/2019  . Hyperkalemia 04/10/2019  . Cerebral embolism with cerebral infarction 03/21/2019  . Gastritis and gastroduodenitis   . NSVT (nonsustained ventricular tachycardia) (HTiffin 03/08/2019  . Tobacco abuse counseling 03/08/2019  . Iron deficiency anemia   . Acute on chronic systolic CHF (congestive heart failure) (HW[place]ougal 03/06/2019  . Mixed diabetic hyperlipidemia  associated with type 2 diabetes mellitus ([place])   . Primary osteoarthritis of right hip   . Hypomagnesemia   . Steroid-induced hyperglycemia   . Supplemental oxygen dependent   . Acute on chronic anemia   . Diabetic peripheral neuropathy ([place])   . Acute on chronic systolic (congestive) heart failure ([place])   . Acute on chronic respiratory failure ([place]) 01/14/2019  . Hypoxia   . Altered mental status   . RVF (right ventricular failure) ([place]) 12/30/2018  . AKI (acute kidney injury) ([place])   . Acute  respiratory failure ([place]) 11/22/2018  . Acute on chronic respiratory failure with hypoxia and hypercapnia ([place]) 11/13/2018  . Metabolic encephalopathy 25/85/2778  . Acute respiratory failure with hypoxia and hypercapnia ([place]) 07/26/2018  . Acute exacerbation of CHF (congestive heart failure) ([place]) 07/26/2018  . Chronic respiratory failure with hypoxia ([place]) 12/28/2017  . Acute metabolic encephalopathy   . Atrial fibrillation with RVR ([place])   . SVT (supraventricular tachycardia) ([place])   . COPD GOLD 0   . Medically noncompliant   . Panlobular emphysema ([place])   . OSA (obstructive sleep apnea)   . Pulmonary hypertension ([place])   . Disorientation   . Pressure injury of skin 09/07/2016  . Acute on chronic respiratory failure with hypoxia ([place]) 09/05/2016  . Acute on chronic combined systolic and diastolic CHF (congestive heart failure) ([place]) 09/05/2016  . Skin lesion-left heal 09/05/2016  . Chronic systolic CHF (congestive heart failure) ([place]) 07/16/2016  . COPD (chronic obstructive pulmonary disease) ([place]) 07/16/2016  . Uncontrolled type 2 diabetes mellitus with complication ([place])   . Diabetic polyneuropathy associated with diabetes mellitus due to underlying condition ([place])   . Normocytic anemia 06/29/2016  . Coronary artery disease involving native coronary artery of native heart 01/21/2016  . Nonischemic cardiomyopathy ([place]) 10/09/2015  . PAF (paroxysmal atrial fibrillation) ([place])   . Lactic acidosis 02/19/2015  . Chronic pain 02/19/2015  . Morbid obesity ([place]) 02/13/2015  . SOB (shortness of breath)   . Elevated troponin I level 11/01/2014  . COPD with acute exacerbation ([place]) 11/01/2014  . Essential hypertension 10/31/2014  . Type 2 diabetes mellitus with neuropathy 10/31/2014  . Hyperlipidemia  10/31/2014  . Cigarette smoker 10/31/2014    Past Surgical History:  Procedure Laterality Date  . BIOPSY  03/11/2019   Procedure: BIOPSY;  Surgeon: Lavena Bullion, DO;  Location: [facility];  Service: Gastroenterology;;  . BIOPSY  03/15/2019   Procedure: BIOPSY;  Surgeon: Irving Copas., MD;  Location: [facility];  Service: Gastroenterology;;  . CARDIOVERSION N/A 07/30/2018   Procedure: CARDIOVERSION;  Surgeon: Larey Dresser, MD;  [facility] ENDOSCOPY;  Service: Cardiovascular;  Laterality: N/A;  . CARDIOVERSION N/A 12/17/2020   Procedure: CARDIOVERSION;  Surgeon: Larey Dresser, MD;  Location: [facility] ENDOSCOPY;  Service: Cardiovascular;  Laterality: N/A;  . CARDIOVERSION N/A 01/10/2021   Procedure: CARDIOVERSION;  Surgeon: Larey Dresser, MD;  Location: [facility] ENDOSCOPY;  Service: Cardiovascular;  Laterality: N/A;  . COLONOSCOPY N/A 03/11/2019   Procedure: COLONOSCOPY;  Surgeon: Lavena Bullion, DO;  Location: [facility];  Service: Gastroenterology;  Laterality: N/A;  . ENTEROSCOPY N/A 03/15/2019   Procedure: ENTEROSCOPY;  Surgeon: Rush Landmark Telford Nab., MD;  Location: [facility];  Service: Gastroenterology;  Laterality: N/A;  . ESOPHAGOGASTRODUODENOSCOPY Left 03/11/2019   Procedure: ESOPHAGOGASTRODUODENOSCOPY (EGD);  Surgeon: Lavena Bullion, DO;  Location: [facility] ENDOSCOPY;  Service: Gastroenterology;  Laterality: Left;  . LEFT AND RIGHT HEART CATHETERIZATION WITH CORONARY ANGIOGRAM N/A 11/06/2014  Procedure: LEFT AND RIGHT HEART CATHETERIZATION WITH CORONARY ANGIOGRAM;  Surgeon: Jettie Booze, MD;  Location: [facility] CATH LAB;  Service: Cardiovascular;  Laterality: N/A;  . RIGHT/LEFT HEART CATH AND CORONARY ANGIOGRAPHY N/A 12/06/2018   Procedure: RIGHT/LEFT HEART CATH AND CORONARY ANGIOGRAPHY;  Surgeon: Larey Dresser, MD;  Location: [facility] CV LAB;  Service: Cardiovascular;  Laterality: N/A;  . SPLENECTOMY    . SUBMUCOSAL TATTOO INJECTION  03/15/2019   Procedure: SUBMUCOSAL TATTOO INJECTION;  Surgeon: Irving Copas., MD;  Location: [facility];  Service: Gastroenterology;;  . TEE WITHOUT CARDIOVERSION N/A 12/17/2020   Procedure:  TRANSESOPHAGEAL ECHOCARDIOGRAM (TEE);  Surgeon: Larey Dresser, MD;  Location[facility] ENDOSCOPY;  Service: Cardiovascular;  Laterality: N/A;       Family History  Problem Relation Age of Onset  . Heart disease Mother   . Hypertension Mother   . Heart failure Mother   . Heart disease Father   . Hypertension Sister   . Heart attack Neg Hx   . Stroke Neg Hx   . Colon cancer Neg Hx   . Esophageal cancer Neg Hx     Social History   Tobacco Use  . Smoking status: Former Smoker    Packs/day: 1.00    Years: 34.00    Pack years: 34.00    Types: Cigarettes    Quit date: 11/30/2018    Years since quitting: 2.2  . Smokeless tobacco: Never Used  Vaping Use  . Vaping Use: Never used  Substance Use Topics  . Alcohol use: No    Alcohol/week: 0.0 standard drinks  . Drug use: No    Home Medications Prior to Admission medications   Medication Sig Start Date End Date Taking? Authorizing Provider  acetaminophen (TYLENOL) 500 MG tablet Take 500 mg by mouth every 6 (six) hours as needed for moderate pain.    [provider]  albuterol (VENTOLIN HFA) 108 (90 Base) MCG/ACT inhaler Inhale 2 puffs into the lungs every 6 (six) hours as needed for wheezing or shortness of breath.    [provider]  amiodarone (PACERONE) 200 MG tablet Take 1 tablet (200 mg total) by mouth daily. 02/03/21   Almyra Deforest, PA  apixaban (ELIQUIS) 5 MG TABS tablet Take 1 tablet (5 mg total) by mouth 2 (two) times daily. 02/03/21   Almyra Deforest, PA  ascorbic acid (VITAMIN C) 500 MG tablet Take 500 mg by mouth daily. 07/03/20   [provider]  atorvastatin (LIPITOR) 40 MG tablet Take 1 tablet (40 mg total) by mouth daily. Patient taking differently: Take 40 mg by mouth at bedtime. 10/30/20   Larey Dresser, MD  Budeson-Glycopyrrol-Formoterol (BREZTRI AEROSPHERE) 160-9-4.8 MCG/ACT AERO Inhale 2 puffs into the lungs 2 (two) times daily. 06/21/20   Tanda Rockers, MD  carbamide peroxide (DEBROX) 6.5 % OTIC  solution Place 5-10 drops into both ears 3 (three) times daily as needed (ear wax).    [provider]  Carboxymethylcellulose Sod PF 0.25 % SOLN Place 1 drop into both eyes 2 (two) times daily as needed (dry eyes).    [provider]  Cholecalciferol (VITAMIN D) 50 MCG (2000 UT) CAPS Take 2,000 Units by mouth daily.    [provider]  famotidine (PEPCID) 20 MG tablet TAKE 1 TABLET(20 MG) BY MOUTH TWICE DAILY Patient taking differently: Take 20 mg by mouth 2 (two) times daily as needed for indigestion. 10/29/20   Larey Dresser, MD  feeding supplement, Horatio, ([place])  LIQD Take 237 mLs by mouth 3 (three) times daily between meals. 11/27/20   Swayze, Ava, DO  ferrous sulfate 325 (65 FE) MG tablet Take 325 mg by mouth every Monday, Wednesday, and Friday at 6 PM.    [provider]  fluticasone (FLONASE) 50 MCG/ACT nasal spray Place 2 sprays into both nostrils in the morning and at bedtime.    [provider]  gabapentin (NEURONTIN) 300 MG capsule Take 300 mg by mouth daily as needed (pain). 12/03/20   [provider]  hydrocortisone (ANUSOL-HC) 2.5 % rectal cream Place 1 application rectally 2 (two) times daily as needed for hemorrhoids or itching.     [provider]  insulin aspart protamine- aspart (NOVOLOG MIX 70/30) (70-30) 100 UNIT/ML injection Inject 10 Units into the skin 2 (two) times daily with a meal.    [provider]  Insulin Syringe 27G X 1/2" 0.5 ML MISC 100 Syringes by Does not apply route 3 (three) times daily. 08/29/19   Hosie Poisson, MD  ipratropium-albuterol (DUONEB) 0.5-2.5 (3) MG/3ML SOLN Take 3 mLs by nebulization 4 (four) times daily as needed (sob/wheezing).    [provider]  latanoprost (XALATAN) 0.005 % ophthalmic solution Place 1 drop into both eyes at bedtime.    [provider]  metolazone (ZAROXOLYN) 2.5 MG tablet Take 1 tablet (2.5 mg total) by mouth once a week.  Take 1 tablet once a week on Monday, taken 30 min prior to morning torsemide 02/03/21   Almyra Deforest, PA  Nystatin (GERHARDT'S BUTT CREAM) CREA Apply 1 application topically 2 (two) times daily. 01/10/21   Nita Sells, MD  oxyCODONE-acetaminophen (PERCOCET) 10-325 MG tablet Take 1 tablet by mouth every 6 (six) hours.    [provider]  OXYGEN Inhale 3 L/min into the lungs continuous.    [provider]  pantoprazole (PROTONIX) 40 MG tablet Take 40 mg by mouth 2 (two) times daily. 07/03/20   [provider]  polyethylene glycol powder (GLYCOLAX/MIRALAX) 17 GM/SCOOP powder Take 17 g by mouth daily as needed for constipation. 07/03/20   [provider]  potassium chloride SA (KLOR-CON) 20 MEQ tablet Take 1 tablet (20 mEq total) by mouth 2 (two) times daily. Take extra 50mq (2 tablets) every Monday with metolazone 02/03/21   MAlmyra Deforest PA  senna-docusate (SENOKOT-S) 8.6-50 MG tablet Take 1 tablet by mouth at bedtime as needed for mild constipation. 07/03/20   [provider]  tamsulosin (FLOMAX) 0.4 MG CAPS capsule Take 1 capsule (0.4 mg total) by mouth daily. Patient taking differently: Take 0.4 mg by mouth daily after breakfast. 10/09/20   VGeradine Girt DO  torsemide 40 MG TABS Take 80 mg by mouth 2 (two) times daily. 02/03/21   MAlmyra Deforest PA    Allergies    Isosorb dinitrate-hydralazine, Benazepril, Brimonidine, Metformin, and Morphine  Review of Systems   Review of Systems  Constitutional: Negative for chills and fever.  HENT: Negative for congestion and facial swelling.   Eyes: Negative for discharge and visual disturbance.  Respiratory: Negative for shortness of breath.   Cardiovascular: Negative for chest pain and palpitations.  Gastrointestinal: Positive for diarrhea. Negative for abdominal pain and vomiting.  Genitourinary: Positive for hematuria.  Musculoskeletal: Negative for arthralgias and myalgias.  Skin: Negative for color change and  rash.  Neurological: Negative for tremors, syncope and headaches.  Psychiatric/Behavioral: Negative for confusion and dysphoric mood.    Physical Exam Updated Vital Signs BP 113/63   Pulse 79  Temp 98.1 F (36.7 C) (Oral)   Resp 14   Ht 6' (1.829 m)   Wt 96.6 kg   SpO2 98%   BMI 28.89 kg/m   Physical Exam Vitals and nursing note reviewed.  Constitutional:      Appearance: He is well-developed.  HENT:     Head: Normocephalic and atraumatic.  Eyes:     Pupils: Pupils are equal, round, and reactive to light.  Neck:     Vascular: No JVD.  Cardiovascular:     Rate and Rhythm: Normal rate and regular rhythm.     Heart sounds: No murmur heard. No friction rub. No gallop.   Pulmonary:     Effort: No respiratory distress.     Breath sounds: No wheezing.  Abdominal:     General: There is no distension.     Tenderness: There is no abdominal tenderness. There is no guarding or rebound.  Genitourinary:    Comments: Soft brown stool no gross blood Musculoskeletal:        General: Normal range of motion.     Cervical back: Normal range of motion and neck supple.  Skin:    Coloration: Skin is not pale.     Findings: No rash.  Neurological:     Mental Status: He is alert and oriented to person, place, and time.  Psychiatric:        Behavior: Behavior normal.     ED Results / Procedures / Treatments   Labs (all labs ordered are listed, but only abnormal results are displayed) Labs Reviewed  CBC WITH DIFFERENTIAL/PLATELET - Abnormal; Notable for the following components:      Result Value   WBC 12.2 (*)    RBC 3.47 (*)    Hemoglobin 9.1 (*)    HCT 29.8 (*)    RDW 18.7 (*)    Neutro Abs 9.5 (*)    Lymphs Abs 0.6 (*)    Monocytes Absolute 2.0 (*)    All other components within normal limits  BASIC METABOLIC PANEL - Abnormal; Notable for the following components:   Potassium 2.7 (*)    Chloride 92 (*)    Glucose, Bld 180 (*)    BUN 108 (*)    Creatinine, Ser 3.51  (*)    Calcium 8.2 (*)    GFR, Estimated 18 (*)    All other components within normal limits  URINALYSIS, ROUTINE W REFLEX MICROSCOPIC - Abnormal; Notable for the following components:   Color, Urine AMBER (*)    APPearance CLOUDY (*)    Glucose, UA 50 (*)    Hgb urine dipstick MODERATE (*)    Protein, ur 100 (*)    Leukocytes,Ua MODERATE (*)    RBC / HPF >50 (*)    WBC, UA >50 (*)    Bacteria, UA FEW (*)    All other components within normal limits  MAGNESIUM    EKG EKG Interpretation  Date/Time:  Wednesday February 13 2021 18:58:23 EDT Ventricular Rate:  78 PR Interval:    QRS Duration: 135 QT Interval:  370 QTC Calculation: 422 R Axis:   110 Text Interpretation: Atrial flutter with predominant 4:1 AV block Right bundle branch block Otherwise no significant change Confirmed by Deno Etienne [phone]) on 02/13/2021 7:17:59 PM   Radiology No results found.  Procedures Procedures   Medications Ordered in ED Medications  potassium chloride 10 mEq in 100 mL IVPB (has no administration in time range)  potassium chloride SA (KLOR-CON) CR tablet  40 mEq (has no administration in time range)  cephALEXin (KEFLEX) capsule 1,000 mg (1,000 mg Oral Given 02/13/21 2306)    ED Course  I have reviewed the triage vital signs and the nursing notes.  Pertinent labs & imaging results that were available during my care of the patient were reviewed by me and considered in my medical decision making (see chart for details).    MDM Rules/Calculators/A&P                          75 yo M with a chief complaint of diarrhea and dark urine.  Going on for a few days.  Not obviously having a GI bleed on rectal exam.  Could have hematuria.  Will obtain a UA blood work reassess.  Hemoglobin stable.  UA concerning for UTI.  Will start on antibiotics.   Hypokalemia. Mild aki.  Patient still appears well and nontoxic.  Will give him 2 rounds of potassium and oral K.  Suspect if he is doing well he will  be likely okay to discharge home on oral antibiotics.  There is a bit of a social component as the patient is not very functional and lives with his wife alone.  Signed out to Dr. Betsey Holiday, please see his note for further details of care in the ED.  The patients results and plan were reviewed and discussed.   Any x-rays performed were independently reviewed by myself.   Differential diagnosis were considered with the presenting HPI.  Medications  potassium chloride 10 mEq in 100 mL IVPB (has no administration in time range)  potassium chloride SA (KLOR-CON) CR tablet 40 mEq (has no administration in time range)  cephALEXin (KEFLEX) capsule 1,000 mg (1,000 mg Oral Given 02/13/21 2306)    Vitals:   02/13/21 2100 02/13/21 2115 02/13/21 2145 02/13/21 2300  BP: (!) 106/56 (!) 106/48 (!) 103/44 113/63  Pulse: 72 72 77 79  Resp: _0 Temp:      TempSrc:      SpO2: 100% 100% 99% 98%  Weight:      Height:        Final diagnoses:  Acute cystitis with hematuria  Hypokalemia  AKI (acute kidney injury) ([place])    Admission/ observation were discussed with the admitting physician, patient and/or family and they are comfortable with the plan.    Final Clinical Impression(s) / ED Diagnoses Final diagnoses:  Acute cystitis with hematuria  Hypokalemia  AKI (acute kidney injury) [facility])    Rx / DC Orders ED Discharge Orders    None       Deno Etienne, DO 02/13/21 2337

## 2021-02-13 NOTE — ED Triage Notes (Addendum)
Patient arrives today from home with no pain complaints, Reports hematuria  And uncontrolled diarrhea. Patient on 5L at home and saturating at well. Patient does not walk. 20G PIV placed in left hand. Patient alert and oriented x 4. Patient requesting condom catheter at this time. Patient takes Eliquist for last two years.

## 2021-02-13 NOTE — Telephone Encounter (Signed)
Called pt to see if he still had nose bleed no answer/left vm requesting return call.

## 2021-02-13 NOTE — ED Notes (Addendum)
Upon arrival, patient had bowel movement. Patient cleaned and placed in new pamper.

## 2021-02-14 LAB — BASIC METABOLIC PANEL
Anion gap: 10 (ref 5–15)
BUN: 99 mg/dL — ABNORMAL HIGH (ref 8–23)
CO2: 30 mmol/L (ref 22–32)
Calcium: 7.8 mg/dL — ABNORMAL LOW (ref 8.9–10.3)
Chloride: 96 mmol/L — ABNORMAL LOW (ref 98–111)
Creatinine, Ser: 3.09 mg/dL — ABNORMAL HIGH (ref 0.61–1.24)
GFR, Estimated: 21 mL/min — ABNORMAL LOW (ref >60)
Glucose, Bld: 193 mg/dL — ABNORMAL HIGH (ref 70–99)
Potassium: 3.4 mmol/L — ABNORMAL LOW (ref 3.5–5.1)
Sodium: 136 mmol/L (ref 135–145)

## 2021-02-14 MED ORDER — CEPHALEXIN 500 MG PO CAPS: 500.0000 mg | ORAL_CAPSULE | Freq: Two times a day (BID) | ORAL | 0 refills | Status: DC

## 2021-02-14 NOTE — ED Notes (Signed)
PTAR called

## 2021-02-14 NOTE — ED Provider Notes (Signed)
Patient signed out to me by Dr. Tyrone Nine.  He has been seen with episodes of diarrhea and generalized weakness.  He was found to be hypokalemic.  Patient has been gently hydrated and given potassium supplementation.  Repeat BMP shows significant improvement of his potassium level as well as improvement of his creatinine, back close to his baseline.  Patient has done well through the night, no further diarrhea stools.  No rectal bleeding.  Will be appropriate for return to home.   Orpah Greek, MD 02/14/21 [phone]

## 2021-02-15 ENCOUNTER — Ambulatory Visit (HOSPITAL_COMMUNITY)
Admission: RE | Admit: 2021-02-15 | Discharge: 2021-02-15 | Disposition: A | Discharge status: Home / Self Care | Payer: Medicare HMO | Source: Ambulatory Visit | Attending: Cardiology | Admitting: Cardiology

## 2021-02-15 ENCOUNTER — Other Ambulatory Visit: Payer: Self-pay

## 2021-02-15 ENCOUNTER — Encounter (HOSPITAL_COMMUNITY): Payer: Self-pay

## 2021-02-15 VITALS — BP 110/60 | HR 83 | Wt 218.4 lb

## 2021-02-15 DIAGNOSIS — Z79899 Other long term (current) drug therapy: Secondary | ICD-10-CM | POA: Insufficient documentation

## 2021-02-15 DIAGNOSIS — J9610 Chronic respiratory failure, unspecified whether with hypoxia or hypercapnia: Secondary | ICD-10-CM | POA: Insufficient documentation

## 2021-02-15 DIAGNOSIS — N179 Acute kidney failure, unspecified: Secondary | ICD-10-CM | POA: Insufficient documentation

## 2021-02-15 DIAGNOSIS — I4892 Unspecified atrial flutter: Secondary | ICD-10-CM | POA: Insufficient documentation

## 2021-02-15 DIAGNOSIS — E785 Hyperlipidemia, unspecified: Secondary | ICD-10-CM | POA: Insufficient documentation

## 2021-02-15 DIAGNOSIS — E1122 Type 2 diabetes mellitus with diabetic chronic kidney disease: Secondary | ICD-10-CM | POA: Insufficient documentation

## 2021-02-15 DIAGNOSIS — N184 Chronic kidney disease, stage 4 (severe): Secondary | ICD-10-CM | POA: Diagnosis not present

## 2021-02-15 DIAGNOSIS — I428 Other cardiomyopathies: Secondary | ICD-10-CM | POA: Diagnosis not present

## 2021-02-15 DIAGNOSIS — E876 Hypokalemia: Secondary | ICD-10-CM | POA: Insufficient documentation

## 2021-02-15 DIAGNOSIS — I272 Pulmonary hypertension, unspecified: Secondary | ICD-10-CM | POA: Diagnosis not present

## 2021-02-15 DIAGNOSIS — G4733 Obstructive sleep apnea (adult) (pediatric): Secondary | ICD-10-CM | POA: Diagnosis not present

## 2021-02-15 DIAGNOSIS — I2510 Atherosclerotic heart disease of native coronary artery without angina pectoris: Secondary | ICD-10-CM | POA: Insufficient documentation

## 2021-02-15 DIAGNOSIS — I130 Hypertensive heart and chronic kidney disease with heart failure and stage 1 through stage 4 chronic kidney disease, or unspecified chronic kidney disease: Secondary | ICD-10-CM | POA: Diagnosis not present

## 2021-02-15 DIAGNOSIS — Z7901 Long term (current) use of anticoagulants: Secondary | ICD-10-CM | POA: Diagnosis not present

## 2021-02-15 DIAGNOSIS — J449 Chronic obstructive pulmonary disease, unspecified: Secondary | ICD-10-CM | POA: Insufficient documentation

## 2021-02-15 DIAGNOSIS — Z955 Presence of coronary angioplasty implant and graft: Secondary | ICD-10-CM | POA: Diagnosis not present

## 2021-02-15 DIAGNOSIS — Z794 Long term (current) use of insulin: Secondary | ICD-10-CM | POA: Diagnosis not present

## 2021-02-15 DIAGNOSIS — I480 Paroxysmal atrial fibrillation: Secondary | ICD-10-CM | POA: Insufficient documentation

## 2021-02-15 DIAGNOSIS — Z8673 Personal history of transient ischemic attack (TIA), and cerebral infarction without residual deficits: Secondary | ICD-10-CM | POA: Diagnosis not present

## 2021-02-15 DIAGNOSIS — Z87891 Personal history of nicotine dependence: Secondary | ICD-10-CM | POA: Diagnosis not present

## 2021-02-15 DIAGNOSIS — I5022 Chronic systolic (congestive) heart failure: Secondary | ICD-10-CM | POA: Diagnosis not present

## 2021-02-15 DIAGNOSIS — E7800 Pure hypercholesterolemia, unspecified: Secondary | ICD-10-CM | POA: Diagnosis not present

## 2021-02-15 DIAGNOSIS — Z9114 Patient's other noncompliance with medication regimen: Secondary | ICD-10-CM | POA: Diagnosis not present

## 2021-02-15 DIAGNOSIS — Z9119 Patient's noncompliance with other medical treatment and regimen: Secondary | ICD-10-CM | POA: Insufficient documentation

## 2021-02-15 DIAGNOSIS — I5042 Chronic combined systolic (congestive) and diastolic (congestive) heart failure: Secondary | ICD-10-CM | POA: Insufficient documentation

## 2021-02-15 DIAGNOSIS — Z8249 Family history of ischemic heart disease and other diseases of the circulatory system: Secondary | ICD-10-CM | POA: Insufficient documentation

## 2021-02-15 LAB — BASIC METABOLIC PANEL
Anion gap: 9 (ref 5–15)
BUN: 84 mg/dL — ABNORMAL HIGH (ref 8–23)
CO2: 31 mmol/L (ref 22–32)
Calcium: 8.5 mg/dL — ABNORMAL LOW (ref 8.9–10.3)
Chloride: 95 mmol/L — ABNORMAL LOW (ref 98–111)
Creatinine, Ser: 2.69 mg/dL — ABNORMAL HIGH (ref 0.61–1.24)
GFR, Estimated: 24 mL/min — ABNORMAL LOW (ref >60)
Glucose, Bld: 151 mg/dL — ABNORMAL HIGH (ref 70–99)
Potassium: 3.5 mmol/L (ref 3.5–5.1)
Sodium: 135 mmol/L (ref 135–145)

## 2021-02-15 MED ORDER — TORSEMIDE 40 MG PO TABS
80.0000 mg | ORAL_TABLET | Freq: Every day | ORAL | 6 refills | Status: AC
Start: 2021-02-15 — End: ?

## 2021-02-15 NOTE — Progress Notes (Addendum)
ADVANCED HF CLINIC CONSULT NOTE  Referring Physician:Varanasi, Jaydeep Primary Care:Mulles, Corazon Primary Cardiologist:Varanasi, Jaydeep  HPI:  Nicholas Caldwell is a 75 year old history ofchronic primarily diastolic CHF with severe RV dysfunction, COPD on 3 L O2, DM, HTN, OHS/OSA on CPAP,paroxysmal atrial flutter/fibrillations/p DCCV, hyperlipidemia, CAD, and noncompliance.He has multiple admissions due to HF and respiratory failure.  Diagnosed w/ NICM 2016 LHC at that time mild nonobstructive CAD, 2D echo 07/01/16: mod dilated LV, mod LVH, EF 25-30%, mild-mod Nicholas, sev LAE, mild-mod reduced RV systolic function, mild-mod TR, PASP 72mHg.  Initially primarily followed by Dr. VIrish Lack Also seen by Dr. ARayann Hemandeemed not to be a candidate for ablation or flecainide due to cardiomyopathy or tikosyn due to prolonged QT.  First Established with AHF clinic in 2017-2018 time period.  Multiple hospitalizations over the years for acute on chronic respiratory failure.  In 2022 Admitted from 2/8-2/16/22 left-sided PNA, and creatinine was up to 3.56 from baseline around 1.8.He was also noted to be in rate-controlled atrial flutter (had been in NSR in the recent past). He was markedly hypokalemic. Eliquis was held with encephalopathy and he was started on heparin gtt. He was started on vancomycin/cefepime/Flagyl.K repleated.He was started on IV fluidw/ improvement in symptoms. SCr improved and stablized ~2.2. Was eventually restarted back on PO diuretics, torsemide 80 mg daily. Prior to d/c, he underwent successful TEE guided DCCV back to NSR. Of note, LVEF was slightly reduced at 45-50%. RV severely reduced.    Presented 3/5 with A/C diastolic heart failure and A fib . Diuresed with IV lasix and transitioned to 80 mg twice a day. Also had cardioversion for A fib-->NSR. EP consulted and he was not a candidate for ablation.  Dis[facility] on 01/10/21. [facility]   History of  multiple missed appointments and frequent admissions for respiratory failure.    Readmitted 3/26 with increased shortness of breath. CXR with progressive pulmonary edema. Creatinine 3.4 , and BNP 3609. Started on IV lasix > discharged with torsemide 80 bid cr 2.8.    Recent ED visit two days agoSays he was having constipation and took some laxatives to relieve it and ended up having 1-2 loose stools was still on diuretics at the time. Still on abx for UTI.      Here for hospital follow up.  Since discharge he tells me his breathing is about at his baseline.  Still very weak.  He reports he has been taking all his medications.  Takes his metolazone on Mondays.  Not weighing himself, it is difficult for him and doesn't have a scale he can use.      Review of Systems: [y] = yes, _0  = no   General: Weight gain _1 ; Weight loss _2 ; Anorexia _3 ; Fatigue [Blue.Reese]; Fever _4 ; Chills _5 ; Weakness _6   Cardiac: Chest pain/pressure _7 ; Resting SOB [Blue.Reese]; Exertional SOB [Blue.Reese]; Orthopnea _8 ; Pedal Edema _9 ; Palpitations _10 ; Syncope _11 ; Presyncope _12 ; Paroxysmal nocturnal dyspnea_13   Pulmonary: Cough _14 ; Wheezing_15 ; Hemoptysis_16 ; Sputum _17 ; Snoring _18   GI: Vomiting_19 ; Dysphagia_20 ; Melena_21 ; Hematochezia _22 ; Heartburn_23 ; Abdominal pain _24 ; Constipation _25 ; Diarrhea _26 ; BRBPR _27   GU: Hematuria_28 ; Dysuria _29 ; Nocturia_30   Vascular: Pain in legs with walking _31 ; Pain in feet with lying flat _32 ; Non-healing sores _33 ; Stroke _34 ;  TIA _0 ; Slurred speech _1 ;  Neuro: Headaches_2 ; Vertigo_3 ; Seizures_4 ; Paresthesias_5 ;Blurred vision _6 ; Diplopia _7 ; Vision changes _8   Ortho/Skin: Arthritis _9 ; Joint pain _10 ; Muscle pain _11 ; Joint swelling _12 ; Back Pain _13 ; Rash _14   Psych: Depression_15 ; Anxiety_16   Heme: Bleeding problems _17 ; Clotting disorders _18 ; Anemia _19   Endocrine: Diabetes Blue.Reese ]; Thyroid dysfunction_20    Past Medical History:  Diagnosis Date  . Atrial flutter ([place])     a. recurrent AFlutter with RVR;  b. Amiodarone Rx started 4/16  . CAD (coronary artery disease)    a. LHC 1/16:  mLAD diffuse disease, pLCx mild disease, dLCx with disease but too small for PCI, RCA ok, EF 25-30%  . Chronic pain   . Chronic systolic CHF (congestive heart failure) ([place])   . COPD (chronic obstructive pulmonary disease) ([place])   . Diabetes mellitus without complication ([place])   . Ex-smoker 11/2017  . Hypercholesteremia   . Hypertension   . NICM (nonischemic cardiomyopathy) ([place])    a.dx 2016. b. 2D echo 06/2016 - Last echo 07/01/16: mod dilated LV, mod LVH, EF 25-30%, mild-mod Nicholas, sev LAE, mild-mod reduced RV systolic function, mild-mod TR, PASP 34mHG.  .Marland KitchenPAF (paroxysmal atrial fibrillation) ([facility])    On amio - ot a candidate for flecainide due to cardiomyopathy, not a candidate for Tikosyn due to prolonged QT, and felt to be a poor candidate for ablation given left atrial size.  . [place]   . Sleep apnea   . Tobacco abuse     Current Outpatient Medications  Medication Sig Dispense Refill  . acetaminophen (TYLENOL) 500 MG tablet Take 500 mg by mouth every 6 (six) hours as needed for moderate pain.    .Marland Kitchenalbuterol (VENTOLIN HFA) 108 (90 Base) MCG/ACT inhaler Inhale 2 puffs into the lungs every 6 (six) hours as needed for wheezing or shortness of breath.    .Marland Kitchenamiodarone (PACERONE) 200 MG tablet Take 1 tablet (200 mg total) by mouth daily. 90 tablet 1  . apixaban (ELIQUIS) 5 MG TABS tablet Take 1 tablet (5 mg total) by mouth 2 (two) times daily. 60 tablet 5  . ascorbic acid (VITAMIN C) 500 MG tablet Take 500 mg by mouth daily.    .Marland Kitchenatorvastatin (LIPITOR) 40 MG tablet Take 1 tablet (40 mg total) by mouth daily. 90 tablet 3  . Budeson-Glycopyrrol-Formoterol (BREZTRI AEROSPHERE) 160-9-4.8 MCG/ACT AERO Inhale 2 puffs into the lungs 2 (two) times daily. 10.7 g 11  . carbamide peroxide (DEBROX) 6.5 % OTIC solution Place 5-10 drops into both ears 3 (three) times  daily as needed (ear wax).    . Carboxymethylcellulose Sod PF 0.25 % SOLN Place 1 drop into both eyes 2 (two) times daily as needed (dry eyes).    . Cholecalciferol (VITAMIN D) 50 MCG (2000 UT) CAPS Take 2,000 Units by mouth daily.    . feeding supplement, GLUCERNA SHAKE, (GLUCERNA SHAKE) LIQD Take 237 mLs by mouth 3 (three) times daily between meals. 237 mL 0  . ferrous sulfate 325 (65 FE) MG tablet Take 325 mg by mouth every Monday, Wednesday, and Friday at 6 PM.    . fluticasone (FLONASE) 50 MCG/ACT nasal spray Place 2 sprays into both nostrils in the morning and at bedtime.    . gabapentin (NEURONTIN) 300 MG capsule Take 300 mg by mouth daily as needed (pain).    . hydrocortisone (  ANUSOL-HC) 2.5 % rectal cream Place 1 application rectally 2 (two) times daily as needed for hemorrhoids or itching.     . insulin aspart protamine- aspart (NOVOLOG MIX 70/30) (70-30) 100 UNIT/ML injection Inject 10 Units into the skin 2 (two) times daily with a meal.    . Insulin Syringe 27G X 1/2" 0.5 ML MISC 100 Syringes by Does not apply route 3 (three) times daily. 100 each 1  . ipratropium-albuterol (DUONEB) 0.5-2.5 (3) MG/3ML SOLN Take 3 mLs by nebulization 4 (four) times daily as needed (sob/wheezing).    Marland Kitchen latanoprost (XALATAN) 0.005 % ophthalmic solution Place 1 drop into both eyes at bedtime.    . metolazone (ZAROXOLYN) 2.5 MG tablet Take 1 tablet (2.5 mg total) by mouth once a week. Take 1 tablet once a week on Monday, taken 30 min prior to morning torsemide 30 tablet 0  . Nystatin (GERHARDT'S BUTT CREAM) CREA Apply 1 application topically 2 (two) times daily. 1 each 0  . oxyCODONE-acetaminophen (PERCOCET) 10-325 MG tablet Take 1 tablet by mouth every 6 (six) hours.    . OXYGEN Inhale 4 L/min into the lungs continuous.    . pantoprazole (PROTONIX) 40 MG tablet Take 40 mg by mouth 2 (two) times daily.    . polyethylene glycol powder (GLYCOLAX/MIRALAX) 17 GM/SCOOP powder Take 17 g by mouth daily as needed for  constipation.    . potassium chloride SA (KLOR-CON) 20 MEQ tablet Take 1 tablet (20 mEq total) by mouth 2 (two) times daily. Take extra 72mq (2 tablets) every Monday with metolazone 90 tablet 2  . rosuvastatin (CRESTOR) 40 MG tablet Take 20 mg by mouth at bedtime.    . senna-docusate (SENOKOT-S) 8.6-50 MG tablet Take 1 tablet by mouth at bedtime as needed for mild constipation.    . tamsulosin (FLOMAX) 0.4 MG CAPS capsule Take 1 capsule (0.4 mg total) by mouth daily. 30 capsule   . torsemide 40 MG TABS Take 80 mg by mouth 2 (two) times daily. 60 tablet 2   No current facility-administered medications for this encounter.    Allergies  Allergen Reactions  . Isosorb Dinitrate-Hydralazine Shortness Of Breath  . Benazepril Nausea And Vomiting  . Brimonidine Other (See Comments)    Dry eyes  . Metformin Other (See Comments)    Cramps and abdominal pain  . Morphine Rash      Social History   Socioeconomic History  . Marital status: Married    Spouse name: Not on file  . Number of children: Not on file  . Years of education: Not on file  . Highest education level: Not on file  Occupational History  . Not on file  Tobacco Use  . Smoking status: Former Smoker    Packs/day: 1.00    Years: 34.00    Pack years: 34.00    Types: Cigarettes    Quit date: 11/30/2018    Years since quitting: 2.2  . Smokeless tobacco: Never Used  Vaping Use  . Vaping Use: Never used  Substance and Sexual Activity  . Alcohol use: No    Alcohol/week: 0.0 standard drinks  . Drug use: No  . Sexual activity: Not on file  Other Topics Concern  . Not on file  Social History Narrative   ** Merged History Encounter **       Social Determinants of Health   Financial Resource Strain: Low Risk   . Difficulty of Paying Living Expenses: Not very hard  Food Insecurity: No Food Insecurity  .  Worried About Charity fundraiser in the Last Year: Never true  . Ran Out of Food in the Last Year: Never true   Transportation Needs: No Transportation Needs  . Lack of Transportation (Medical): No  . Lack of Transportation (Non-Medical): No  Physical Activity: Not on file  Stress: Not on file  Social Connections: Not on file  Intimate Partner Violence: Not on file      Family History  Problem Relation Age of Onset  . Heart disease Mother   . Hypertension Mother   . Heart failure Mother   . Heart disease Father   . Hypertension Sister   . Heart attack Neg Hx   . Stroke Neg Hx   . Colon cancer Neg Hx   . Esophageal cancer Neg Hx     Vitals:   02/15/21 1431  BP: 110/60  Pulse: 83  SpO2: 99%  Weight: 99.1 kg (218 lb 6.4 oz)    PHYSICAL EXAM: General:  Well appearing. Chronic tremor, No respiratory difficulty HEENT: normal Neck: supple. no JVD. Carotids 2+ bilat; no bruits. No lymphadenopathy or thryomegaly appreciated. Cor: PMI nondisplaced. Regular rate & rhythm. No rubs, gallops or murmurs. Lungs: no rales or wheezes, not in distress Abdomen: soft, nontender, nondistended. No hepatosplenomegaly. No bruits or masses. Good bowel sounds. Extremities: no cyanosis, clubbing, rash, edema Neuro: Chronic tremor,alert & oriented x 3,  Affect pleasant.  ECG:  NSR rate 80, 1st degree av block  ASSESSMENT & PLAN: Combined systolic and Diastolic CHF with prominent RV failure:  -Diagnosed w/ NICM 2016 LHC at that time mild nonobstructive CAD, 2D echo 07/01/16: mod dilated LV, mod LVH, EF 25-30%, mild-mod Nicholas, sev LAE, mild-mod reduced RV systolic function, mild-mod TR, PASP 21mHg -Left side of the heart improved however RV continued to worsen over the years, likely secondary to chronic lung disease, OSA -TEE 12/2020 LVEF mildly reduced 45-50%, RV severely reduced -NYHA class III-IV symptoms, dry on examination -GDMT severely limited by renal function and lower bp -He had 1-2 loose stools after taking a laxative, I'm not sure how much that attributed to his recent AKI, it could be that  after he was decongested he is responding to torsemide more robustly, another possibility is that he just started taking his medications appropriately as he has a history of medication non-adherence. -Hold further torsemide today, decrease torsemide to 839mdaily, continue metolazone 2.75m22meekly, given a scale today if he notices weight gain or edema he will go back up to twice daily  AKI on CKD Stage IV:  -Creatinine Baseline ~2-2.5.  -Recent ED visit with AKI and hypokalemia cr up to 3.51, potassium 2.7 was improving before discharge -check bmp today, adjust diuretics as above -close follow up in one week  PAF:  -He has history of CVA. He had DCCV in 3/20 and again in 5/20. He cannot take Tikosyn due to baseline prolonged QT interval.Underwent TEE/DCCV for AFL 12/17/20. -Most recent DC-CV 01/10/21 with conversion to NSR -EP evaluated and determined he was not a candidate for ablation. .  -Remains in NSR, Continue amiodarone 200 mg daily  -Continue eliquis 5 mg bid  Chronic Respiratory Failure:  -COPD + A/C CHF -On 3.5 liters home oxygen chronically   OSA:  -continue CPAP  CAD:  -Nonobstructive disease 12/2018 cath. No chest pain.  -Continue statin, eliquis

## 2021-02-15 NOTE — Addendum Note (Signed)
Encounter addended by: Katherine Roan, MD on: 02/15/2021 4:43 PM  Actions taken: Clinical Note Signed

## 2021-02-15 NOTE — Patient Instructions (Addendum)
Decrease Furosemide to 80 mg Daily  Labs done today, we call you abnormal results  Your physician recommends that you schedule a follow-up appointment in: 1 week  Do the following things EVERYDAY: 1) Weigh yourself in the morning before breakfast. Write it down and keep it in a log. 2) Take your medicines as prescribed 3) Eat low salt foods--Limit salt (sodium) to 2000 mg per day.  4) Stay as active as you can everyday 5) Limit all fluids for the day to less than 2 liters  If you have any questions or concerns before your next appointment please send [place] a message through [place] or call our office at [phone].    TO LEAVE A MESSAGE FOR THE NURSE SELECT OPTION 2, PLEASE LEAVE A MESSAGE INCLUDING: . YOUR NAME . DATE OF BIRTH . CALL BACK NUMBER . REASON FOR CALL**this is important as we prioritize the call backs  [place] AS LONG AS YOU CALL BEFORE 4:00 PM  At the [facility], you and your health needs are our priority. As part of our continuing mission to provide you with exceptional heart care, we have created designated Provider Care Teams. These Care Teams include your primary Cardiologist (physician) and Advanced Practice Providers (APPs- Physician Assistants and Nurse Practitioners) who all work together to provide you with the care you need, when you need it.   You may see any of the following providers on your designated Care Team at your next follow up: Marland Kitchen Dr Glori Bickers . Dr Loralie Champagne . Dr Vickki Muff . Darrick Grinder, NP . Lyda Jester, Silvana . Audry Riles, PharmD   Please be sure to bring in all your medications bottles to every appointment.

## 2021-02-19 ENCOUNTER — Encounter (HOSPITAL_COMMUNITY): Payer: Self-pay | Admitting: *Deleted

## 2021-02-19 NOTE — Progress Notes (Signed)
Pt's daughter Kyllian Clingerman) request intermittent FMLA to help care for her dad. Forms completed and signed by Dr [name], family is aware and will p/u today

## 2021-02-20 ENCOUNTER — Encounter (HOSPITAL_COMMUNITY): Payer: No Typology Code available for payment source

## 2021-02-21 ENCOUNTER — Other Ambulatory Visit: Payer: Self-pay

## 2021-02-21 NOTE — Patient Outreach (Signed)
[facility]) Care Management  02/21/2021  Nicholas Caldwell 1946-02-20 [id number]   Telephone call for follow up.  No answer. HIPAA compliant voice message left.    Plan: RN CM will attempt again within 4 business days.   Jone Baseman, RN, MSN [place] [facility] Care Management Coordinator Direct Line [phone] Cell [phone] Toll Free: [phone]  Fax: [phone]

## 2021-02-22 ENCOUNTER — Ambulatory Visit: Payer: Self-pay

## 2021-02-25 DIAGNOSIS — J441 Chronic obstructive pulmonary disease with (acute) exacerbation: Secondary | ICD-10-CM | POA: Diagnosis not present

## 2021-02-25 DIAGNOSIS — J9621 Acute and chronic respiratory failure with hypoxia: Secondary | ICD-10-CM | POA: Diagnosis not present

## 2021-02-25 DIAGNOSIS — R269 Unspecified abnormalities of gait and mobility: Secondary | ICD-10-CM | POA: Diagnosis not present

## 2021-02-25 DIAGNOSIS — J449 Chronic obstructive pulmonary disease, unspecified: Secondary | ICD-10-CM | POA: Diagnosis not present

## 2021-02-26 ENCOUNTER — Other Ambulatory Visit: Payer: Self-pay

## 2021-02-26 NOTE — Patient Outreach (Signed)
[name]) Care Management  [place]  02/26/2021   Nicholas Caldwell 13-Jul-1946 [id number]  Subjective: Telephone call to wife [name].  She reports patient doing well.  She states patient is using new scale given.  She is not sure of patient weights but knows that he does weigh. She denies any swelling or shortness of breath. Reiterated heart failure and management. [facility] Wife denies any problems with transportation.    Objective:   Encounter Medications:  Outpatient Encounter Medications as of 02/26/2021  Medication Sig Note  . acetaminophen (TYLENOL) 500 MG tablet Take 500 mg by mouth every 6 (six) hours as needed for moderate pain.   Marland Kitchen albuterol (VENTOLIN HFA) 108 (90 Base) MCG/ACT inhaler Inhale 2 puffs into the lungs every 6 (six) hours as needed for wheezing or shortness of breath. 02/15/2021: .  . amiodarone (PACERONE) 200 MG tablet Take 1 tablet (200 mg total) by mouth daily.   Marland Kitchen apixaban (ELIQUIS) 5 MG TABS tablet Take 1 tablet (5 mg total) by mouth 2 (two) times daily.   Marland Kitchen ascorbic acid (VITAMIN C) 500 MG tablet Take 500 mg by mouth daily.   Marland Kitchen atorvastatin (LIPITOR) 40 MG tablet Take 1 tablet (40 mg total) by mouth daily.   . Budeson-Glycopyrrol-Formoterol (BREZTRI AEROSPHERE) 160-9-4.8 MCG/ACT AERO Inhale 2 puffs into the lungs 2 (two) times daily.   . carbamide peroxide (DEBROX) 6.5 % OTIC solution Place 5-10 drops into both ears 3 (three) times daily as needed (ear wax).   . Carboxymethylcellulose Sod PF 0.25 % SOLN Place 1 drop into both eyes 2 (two) times daily as needed (dry eyes).   . Cholecalciferol (VITAMIN D) 50 MCG (2000 UT) CAPS Take 2,000 Units by mouth daily.   . feeding supplement, GLUCERNA SHAKE, (GLUCERNA SHAKE) LIQD Take 237 mLs by mouth 3 (three) times daily between meals.   . ferrous sulfate 325 (65 FE) MG tablet Take 325 mg by mouth every Monday, Wednesday, and Friday at 6 PM.   . fluticasone  (FLONASE) 50 MCG/ACT nasal spray Place 2 sprays into both nostrils in the morning and at bedtime.   . gabapentin (NEURONTIN) 300 MG capsule Take 300 mg by mouth daily as needed (pain).   . hydrocortisone (ANUSOL-HC) 2.5 % rectal cream Place 1 application rectally 2 (two) times daily as needed for hemorrhoids or itching.    . insulin aspart protamine- aspart (NOVOLOG MIX 70/30) (70-30) 100 UNIT/ML injection Inject 10 Units into the skin 2 (two) times daily with a meal.   . Insulin Syringe 27G X 1/2" 0.5 ML MISC 100 Syringes by Does not apply route 3 (three) times daily.   Marland Kitchen ipratropium-albuterol (DUONEB) 0.5-2.5 (3) MG/3ML SOLN Take 3 mLs by nebulization 4 (four) times daily as needed (sob/wheezing).   Marland Kitchen latanoprost (XALATAN) 0.005 % ophthalmic solution Place 1 drop into both eyes at bedtime.   . metolazone (ZAROXOLYN) 2.5 MG tablet Take 1 tablet (2.5 mg total) by mouth once a week. Take 1 tablet once a week on Monday, taken 30 min prior to morning torsemide   . Nystatin (GERHARDT'S BUTT CREAM) CREA Apply 1 application topically 2 (two) times daily.   Marland Kitchen oxyCODONE-acetaminophen (PERCOCET) 10-325 MG tablet Take 1 tablet by mouth every 6 (six) hours.   . OXYGEN Inhale 4 L/min into the lungs continuous.   . pantoprazole (PROTONIX) 40 MG tablet Take 40 mg by mouth 2 (two) times daily.   . polyethylene glycol powder (GLYCOLAX/MIRALAX)  17 GM/SCOOP powder Take 17 g by mouth daily as needed for constipation.   . potassium chloride SA (KLOR-CON) 20 MEQ tablet Take 1 tablet (20 mEq total) by mouth 2 (two) times daily. Take extra 76mq (2 tablets) every Monday with metolazone   . rosuvastatin (CRESTOR) 40 MG tablet Take 20 mg by mouth at bedtime.   . senna-docusate (SENOKOT-S) 8.6-50 MG tablet Take 1 tablet by mouth at bedtime as needed for mild constipation.   . tamsulosin (FLOMAX) 0.4 MG CAPS capsule Take 1 capsule (0.4 mg total) by mouth daily.   . Torsemide 40 MG TABS Take 80 mg by mouth daily.    No  facility-administered encounter medications on file as of 02/26/2021.    Functional Status:  In your present state of health, do you have any difficulty performing the following activities: 02/03/2021 01/28/2021  Hearing? - N  Vision? - N  Difficulty concentrating or making decisions? - N  Walking or climbing stairs? - Y  Comment - -  Dressing or bathing? - Y  Comment - -  Doing errands, shopping? N Y  Comment - wife assists him with needs  Preparing Food and eating ? - -  Using the Toilet? - -  In the past six months, have you accidently leaked urine? - -  Do you have problems with loss of bowel control? - -  Managing your Medications? - -  Comment - -  Managing your Finances? - -  Comment - -  Housekeeping or managing your Housekeeping? - -  Comment - -  Some recent data might be hidden    Fall/Depression Screening: Fall Risk  01/11/2021 12/26/2016  Falls in the past year? 0 Yes  Number falls in past yr: - 2 or more  Injury with Fall? - Yes  Risk Factor Category  - High Fall Risk  Risk for fall due to : - History of fall(s);Impaired balance/gait;Impaired mobility;Impaired vision  Follow up - Follow up appointment   [facility]11[facility] 12/26/2016  PHQ - 2 Score - 0  Exception Documentation Other- indicate reason in comment box -  Not completed spoke -    Assessment:  Goals Addressed            This Visit's Progress   . COMPLETED: Increase My Muscle Strength       Timeframe:  Short-Term Goal Priority:  High Start Date:   01/11/21                          Expected End Date:    03/02/21                   Follow Up Date3/31/22   - do the exercise that the therapist taught me    Why is this important?    Walking and easy exercises make you stronger. These also give you energy.   Every little bit helps, at any age.    Notes: 01/11/21 Discussed importance of participation with home health.   01/16/21 patient participating with therapy.    . Make and Keep All  Appointments   On track    Timeframe:  Long-Range Goal Priority:  High Start Date:   01/11/21                          Expected End Date:    07/03/21  Follow Up Date 04/02/21   - call to cancel if needed    Why is this important?    Part of staying healthy is seeing the doctor for follow-up care.   If you forget your appointments, there are some things you can do to stay on track.    Notes: 01/11/21 Discussed importance of follow up appointments. 01/16/21 patient has up coming appointments.   01/24/21 patient has upcoming heart appointment on 01-31-21  02/04/21 Patient has PCP follow up on 02/13/21 02/26/21 Patient has appointment with [facility] [facility] PCP on 03/05/21    . Track and Manage Fluids and Swelling-Heart Failure   On track    Timeframe:  Long-Range Goal Priority:  High Start Date:       01/11/21                      Expected End Date:   07/03/21                    Follow Up Date 04/02/21   - call office if I gain more than 2 pounds in one day or 5 pounds in one week - use salt in moderation - weigh myself daily    Why is this important?    It is important to check your weight daily and watch how much salt and liquids you have.   It will help you to manage your heart failure.    Notes: 01/11/21 Discussed importance of daily weights and notifying physician for increases.  01/16/21 patient weight 223 lbs. Patient adhering to guidance.   01/24/21 Discussed notifying heart failure clinic of weight changes.  02/04/21 Reinforced heart failure management and timely follow up.   02/26/21 Reiterated heart failure action plan.     . COMPLETED: Track and Manage Symptoms-Heart Failure       Timeframe:  Long-Range Goal Priority:  High Start Date:   01/11/21                          Expected End Date:    07/03/21                   Follow Up Date 03/02/21   - track symptoms and what helps feel better or worse    Why is this important?    You will be able to handle your symptoms  better if you keep track of them.   Making some simple changes to your lifestyle will help.   Eating healthy is one thing you can do to take good care of yourself.    Notes: 01/11/21 Discussed importance of heart failure regimen. 01/16/21 reiterated heart failure management. Patient weight 223 lbs.  01/24/21 Wife unable to give last weight on call.         Plan: RN CM will outreach again in May.  Follow-up: Caregiver/ Patient agrees to Care Plan and Follow-up.   Jone Baseman, RN, MSN [place] Management Care Management Coordinator Direct Line [phone] Cell [phone] Toll Free: [phone]  Fax: [phone]

## 2021-03-04 ENCOUNTER — Encounter (HOSPITAL_COMMUNITY): Payer: No Typology Code available for payment source

## 2021-03-10 ENCOUNTER — Emergency Department (HOSPITAL_COMMUNITY): Payer: No Typology Code available for payment source

## 2021-03-10 ENCOUNTER — Inpatient Hospital Stay (HOSPITAL_COMMUNITY)
Admission: EM | Admit: 2021-03-10 | Discharge: 2021-04-03 | DRG: 682 | Disposition: E | Payer: No Typology Code available for payment source | Attending: Family Medicine | Admitting: Family Medicine

## 2021-03-10 ENCOUNTER — Other Ambulatory Visit: Payer: Self-pay

## 2021-03-10 ENCOUNTER — Encounter (HOSPITAL_COMMUNITY): Payer: Self-pay | Admitting: Emergency Medicine

## 2021-03-10 DIAGNOSIS — Z6833 Body mass index (BMI) 33.0-33.9, adult: Secondary | ICD-10-CM

## 2021-03-10 DIAGNOSIS — Z9981 Dependence on supplemental oxygen: Secondary | ICD-10-CM

## 2021-03-10 DIAGNOSIS — J9611 Chronic respiratory failure with hypoxia: Secondary | ICD-10-CM | POA: Diagnosis not present

## 2021-03-10 DIAGNOSIS — I071 Rheumatic tricuspid insufficiency: Secondary | ICD-10-CM | POA: Diagnosis present

## 2021-03-10 DIAGNOSIS — R54 Age-related physical debility: Secondary | ICD-10-CM | POA: Diagnosis present

## 2021-03-10 DIAGNOSIS — Z8701 Personal history of pneumonia (recurrent): Secondary | ICD-10-CM

## 2021-03-10 DIAGNOSIS — J189 Pneumonia, unspecified organism: Secondary | ICD-10-CM | POA: Diagnosis not present

## 2021-03-10 DIAGNOSIS — Z888 Allergy status to other drugs, medicaments and biological substances status: Secondary | ICD-10-CM

## 2021-03-10 DIAGNOSIS — I959 Hypotension, unspecified: Secondary | ICD-10-CM | POA: Diagnosis present

## 2021-03-10 DIAGNOSIS — N1832 Chronic kidney disease, stage 3b: Secondary | ICD-10-CM | POA: Diagnosis present

## 2021-03-10 DIAGNOSIS — I5042 Chronic combined systolic (congestive) and diastolic (congestive) heart failure: Secondary | ICD-10-CM | POA: Diagnosis present

## 2021-03-10 DIAGNOSIS — N184 Chronic kidney disease, stage 4 (severe): Secondary | ICD-10-CM | POA: Diagnosis present

## 2021-03-10 DIAGNOSIS — I10 Essential (primary) hypertension: Secondary | ICD-10-CM | POA: Diagnosis present

## 2021-03-10 DIAGNOSIS — Z9081 Acquired absence of spleen: Secondary | ICD-10-CM

## 2021-03-10 DIAGNOSIS — L8996 Pressure-induced deep tissue damage of unspecified site: Secondary | ICD-10-CM

## 2021-03-10 DIAGNOSIS — Z8249 Family history of ischemic heart disease and other diseases of the circulatory system: Secondary | ICD-10-CM

## 2021-03-10 DIAGNOSIS — E1169 Type 2 diabetes mellitus with other specified complication: Secondary | ICD-10-CM | POA: Diagnosis present

## 2021-03-10 DIAGNOSIS — R509 Fever, unspecified: Secondary | ICD-10-CM

## 2021-03-10 DIAGNOSIS — Z794 Long term (current) use of insulin: Secondary | ICD-10-CM

## 2021-03-10 DIAGNOSIS — N400 Enlarged prostate without lower urinary tract symptoms: Secondary | ICD-10-CM | POA: Diagnosis present

## 2021-03-10 DIAGNOSIS — I428 Other cardiomyopathies: Secondary | ICD-10-CM | POA: Diagnosis present

## 2021-03-10 DIAGNOSIS — Z8673 Personal history of transient ischemic attack (TIA), and cerebral infarction without residual deficits: Secondary | ICD-10-CM

## 2021-03-10 DIAGNOSIS — Z9119 Patient's noncompliance with other medical treatment and regimen: Secondary | ICD-10-CM

## 2021-03-10 DIAGNOSIS — I272 Pulmonary hypertension, unspecified: Secondary | ICD-10-CM | POA: Diagnosis present

## 2021-03-10 DIAGNOSIS — Z885 Allergy status to narcotic agent status: Secondary | ICD-10-CM

## 2021-03-10 DIAGNOSIS — D509 Iron deficiency anemia, unspecified: Secondary | ICD-10-CM | POA: Diagnosis present

## 2021-03-10 DIAGNOSIS — R197 Diarrhea, unspecified: Secondary | ICD-10-CM | POA: Diagnosis present

## 2021-03-10 DIAGNOSIS — G8929 Other chronic pain: Secondary | ICD-10-CM | POA: Diagnosis present

## 2021-03-10 DIAGNOSIS — R5381 Other malaise: Secondary | ICD-10-CM | POA: Diagnosis present

## 2021-03-10 DIAGNOSIS — Z87891 Personal history of nicotine dependence: Secondary | ICD-10-CM

## 2021-03-10 DIAGNOSIS — E11649 Type 2 diabetes mellitus with hypoglycemia without coma: Secondary | ICD-10-CM | POA: Diagnosis not present

## 2021-03-10 DIAGNOSIS — N390 Urinary tract infection, site not specified: Secondary | ICD-10-CM | POA: Diagnosis present

## 2021-03-10 DIAGNOSIS — R55 Syncope and collapse: Secondary | ICD-10-CM | POA: Diagnosis present

## 2021-03-10 DIAGNOSIS — Z66 Do not resuscitate: Secondary | ICD-10-CM | POA: Diagnosis present

## 2021-03-10 DIAGNOSIS — E872 Acidosis: Secondary | ICD-10-CM | POA: Diagnosis present

## 2021-03-10 DIAGNOSIS — R0989 Other specified symptoms and signs involving the circulatory and respiratory systems: Secondary | ICD-10-CM | POA: Diagnosis present

## 2021-03-10 DIAGNOSIS — I480 Paroxysmal atrial fibrillation: Secondary | ICD-10-CM | POA: Diagnosis present

## 2021-03-10 DIAGNOSIS — I5022 Chronic systolic (congestive) heart failure: Secondary | ICD-10-CM | POA: Diagnosis present

## 2021-03-10 DIAGNOSIS — R627 Adult failure to thrive: Secondary | ICD-10-CM | POA: Diagnosis present

## 2021-03-10 DIAGNOSIS — Z7901 Long term (current) use of anticoagulants: Secondary | ICD-10-CM

## 2021-03-10 DIAGNOSIS — J440 Chronic obstructive pulmonary disease with acute lower respiratory infection: Secondary | ICD-10-CM | POA: Diagnosis present

## 2021-03-10 DIAGNOSIS — N179 Acute kidney failure, unspecified: Principal | ICD-10-CM | POA: Diagnosis present

## 2021-03-10 DIAGNOSIS — E876 Hypokalemia: Secondary | ICD-10-CM | POA: Diagnosis not present

## 2021-03-10 DIAGNOSIS — Z79899 Other long term (current) drug therapy: Secondary | ICD-10-CM

## 2021-03-10 DIAGNOSIS — I50810 Right heart failure, unspecified: Secondary | ICD-10-CM | POA: Diagnosis present

## 2021-03-10 DIAGNOSIS — J449 Chronic obstructive pulmonary disease, unspecified: Secondary | ICD-10-CM | POA: Diagnosis present

## 2021-03-10 DIAGNOSIS — G9340 Encephalopathy, unspecified: Secondary | ICD-10-CM | POA: Diagnosis not present

## 2021-03-10 DIAGNOSIS — K219 Gastro-esophageal reflux disease without esophagitis: Secondary | ICD-10-CM | POA: Diagnosis present

## 2021-03-10 DIAGNOSIS — R531 Weakness: Secondary | ICD-10-CM | POA: Diagnosis not present

## 2021-03-10 DIAGNOSIS — I130 Hypertensive heart and chronic kidney disease with heart failure and stage 1 through stage 4 chronic kidney disease, or unspecified chronic kidney disease: Secondary | ICD-10-CM | POA: Diagnosis present

## 2021-03-10 DIAGNOSIS — Z87440 Personal history of urinary (tract) infections: Secondary | ICD-10-CM

## 2021-03-10 DIAGNOSIS — I4892 Unspecified atrial flutter: Secondary | ICD-10-CM | POA: Diagnosis present

## 2021-03-10 DIAGNOSIS — G253 Myoclonus: Secondary | ICD-10-CM | POA: Diagnosis not present

## 2021-03-10 DIAGNOSIS — Z79891 Long term (current) use of opiate analgesic: Secondary | ICD-10-CM

## 2021-03-10 DIAGNOSIS — E662 Morbid (severe) obesity with alveolar hypoventilation: Secondary | ICD-10-CM | POA: Diagnosis present

## 2021-03-10 DIAGNOSIS — I5082 Biventricular heart failure: Secondary | ICD-10-CM | POA: Diagnosis present

## 2021-03-10 DIAGNOSIS — I2510 Atherosclerotic heart disease of native coronary artery without angina pectoris: Secondary | ICD-10-CM | POA: Diagnosis present

## 2021-03-10 DIAGNOSIS — E1122 Type 2 diabetes mellitus with diabetic chronic kidney disease: Secondary | ICD-10-CM | POA: Diagnosis present

## 2021-03-10 DIAGNOSIS — E785 Hyperlipidemia, unspecified: Secondary | ICD-10-CM

## 2021-03-10 DIAGNOSIS — E860 Dehydration: Secondary | ICD-10-CM | POA: Diagnosis present

## 2021-03-10 DIAGNOSIS — R5081 Fever presenting with conditions classified elsewhere: Secondary | ICD-10-CM

## 2021-03-10 DIAGNOSIS — Z20822 Contact with and (suspected) exposure to covid-19: Secondary | ICD-10-CM | POA: Diagnosis present

## 2021-03-10 DIAGNOSIS — D631 Anemia in chronic kidney disease: Secondary | ICD-10-CM | POA: Diagnosis present

## 2021-03-10 DIAGNOSIS — B9620 Unspecified Escherichia coli [E. coli] as the cause of diseases classified elsewhere: Secondary | ICD-10-CM | POA: Diagnosis present

## 2021-03-10 DIAGNOSIS — Z515 Encounter for palliative care: Secondary | ICD-10-CM

## 2021-03-10 DIAGNOSIS — L89152 Pressure ulcer of sacral region, stage 2: Secondary | ICD-10-CM | POA: Diagnosis present

## 2021-03-10 DIAGNOSIS — R319 Hematuria, unspecified: Secondary | ICD-10-CM | POA: Diagnosis present

## 2021-03-10 DIAGNOSIS — N183 Chronic kidney disease, stage 3 unspecified: Secondary | ICD-10-CM | POA: Diagnosis present

## 2021-03-10 DIAGNOSIS — E875 Hyperkalemia: Secondary | ICD-10-CM | POA: Diagnosis present

## 2021-03-10 DIAGNOSIS — E7800 Pure hypercholesterolemia, unspecified: Secondary | ICD-10-CM | POA: Diagnosis present

## 2021-03-10 DIAGNOSIS — E1140 Type 2 diabetes mellitus with diabetic neuropathy, unspecified: Secondary | ICD-10-CM

## 2021-03-10 DIAGNOSIS — G4733 Obstructive sleep apnea (adult) (pediatric): Secondary | ICD-10-CM

## 2021-03-10 LAB — CBG MONITORING, ED: Glucose-Capillary: 98 mg/dL (ref 70–99)

## 2021-03-10 LAB — URINALYSIS, ROUTINE W REFLEX MICROSCOPIC
Bacteria, UA: NONE SEEN
Bilirubin Urine: NEGATIVE
Glucose, UA: NEGATIVE mg/dL
Ketones, ur: NEGATIVE mg/dL
Nitrite: NEGATIVE
Protein, ur: NEGATIVE mg/dL
Specific Gravity, Urine: 1.008 (ref 1.005–1.030)
WBC, UA: >50 WBC/hpf — ABNORMAL HIGH (ref 0–5)
pH: 6 (ref 5.0–8.0)

## 2021-03-10 LAB — CBC
HCT: 28.3 % — ABNORMAL LOW (ref 39.0–52.0)
Hemoglobin: 8.4 g/dL — ABNORMAL LOW (ref 13.0–17.0)
MCH: 25.5 pg — ABNORMAL LOW (ref 26.0–34.0)
MCHC: 29.7 g/dL — ABNORMAL LOW (ref 30.0–36.0)
MCV: 85.8 fL (ref 80.0–100.0)
Platelets: 448 10*3/uL — ABNORMAL HIGH (ref 150–400)
RBC: 3.3 MIL/uL — ABNORMAL LOW (ref 4.22–5.81)
RDW: 19.8 % — ABNORMAL HIGH (ref 11.5–15.5)
WBC: 6.2 10*3/uL (ref 4.0–10.5)
nRBC: 0 % (ref 0.0–0.2)

## 2021-03-10 LAB — BASIC METABOLIC PANEL
Anion gap: 9 (ref 5–15)
BUN: 72 mg/dL — ABNORMAL HIGH (ref 8–23)
CO2: 27 mmol/L (ref 22–32)
Calcium: 8.3 mg/dL — ABNORMAL LOW (ref 8.9–10.3)
Chloride: 104 mmol/L (ref 98–111)
Creatinine, Ser: 4.48 mg/dL — ABNORMAL HIGH (ref 0.61–1.24)
GFR, Estimated: 13 mL/min — ABNORMAL LOW (ref >60)
Glucose, Bld: 53 mg/dL — ABNORMAL LOW (ref 70–99)
Potassium: 5.2 mmol/L — ABNORMAL HIGH (ref 3.5–5.1)
Sodium: 140 mmol/L (ref 135–145)

## 2021-03-10 MED ORDER — ALBUTEROL SULFATE (2.5 MG/3ML) 0.083% IN NEBU: 2.5000 mg | INHALATION_SOLUTION | Freq: Four times a day (QID) | RESPIRATORY_TRACT | Status: DC | PRN

## 2021-03-10 MED ORDER — POLYETHYLENE GLYCOL 3350 17 G PO PACK: 17.0000 g | PACK | Freq: Every day | ORAL | Status: DC | PRN

## 2021-03-10 MED ORDER — HYDROCORTISONE 2.5 % RE CREA
1.0000 "application " | TOPICAL_CREAM | Freq: Two times a day (BID) | RECTAL | Status: DC | PRN
  Filled 2021-03-10: qty 28.35

## 2021-03-10 MED ORDER — HYDROCODONE-ACETAMINOPHEN 5-325 MG PO TABS
1.0000 | ORAL_TABLET | Freq: Four times a day (QID) | ORAL | Status: DC | PRN
  Filled 2021-03-10: qty 1

## 2021-03-10 MED ORDER — FERROUS SULFATE 325 (65 FE) MG PO TABS
325.0000 mg | ORAL_TABLET | ORAL | Status: DC
  Administered 2021-03-11 – 2021-03-13 (×2): 325 mg via ORAL
  Filled 2021-03-10 (×4): qty 1

## 2021-03-10 MED ORDER — SODIUM CHLORIDE 0.9 % IV SOLN
500.0000 mg | Freq: Once | INTRAVENOUS | Status: AC
  Administered 2021-03-10: 500 mg via INTRAVENOUS
  Filled 2021-03-10: qty 500

## 2021-03-10 MED ORDER — POLYVINYL ALCOHOL 1.4 % OP SOLN
1.0000 [drp] | Freq: Two times a day (BID) | OPHTHALMIC | Status: DC | PRN
  Filled 2021-03-10: qty 15

## 2021-03-10 MED ORDER — PANTOPRAZOLE SODIUM 40 MG PO TBEC
40.0000 mg | DELAYED_RELEASE_TABLET | Freq: Two times a day (BID) | ORAL | Status: DC
  Administered 2021-03-10 – 2021-03-16 (×12): 40 mg via ORAL
  Filled 2021-03-10 (×12): qty 1

## 2021-03-10 MED ORDER — ATORVASTATIN CALCIUM 40 MG PO TABS
40.0000 mg | ORAL_TABLET | Freq: Every day | ORAL | Status: DC
  Administered 2021-03-11 – 2021-03-16 (×6): 40 mg via ORAL
  Filled 2021-03-10 (×6): qty 1

## 2021-03-10 MED ORDER — BUDESON-GLYCOPYRROL-FORMOTEROL 160-9-4.8 MCG/ACT IN AERO: 2.0000 | INHALATION_SPRAY | Freq: Two times a day (BID) | RESPIRATORY_TRACT | Status: DC

## 2021-03-10 MED ORDER — INSULIN ASPART 100 UNIT/ML IJ SOLN
0.0000 [IU] | Freq: Three times a day (TID) | INTRAMUSCULAR | Status: DC
  Administered 2021-03-11 – 2021-03-13 (×3): 2 [IU] via SUBCUTANEOUS

## 2021-03-10 MED ORDER — SODIUM CHLORIDE 0.9 % IV SOLN
1.0000 g | INTRAVENOUS | Status: AC
  Administered 2021-03-10 – 2021-03-15 (×6): 1 g via INTRAVENOUS
  Filled 2021-03-10 (×6): qty 10

## 2021-03-10 MED ORDER — SODIUM CHLORIDE 0.9% FLUSH
3.0000 mL | Freq: Two times a day (BID) | INTRAVENOUS | Status: DC
  Administered 2021-03-11 – 2021-03-15 (×8): 3 mL via INTRAVENOUS

## 2021-03-10 MED ORDER — CARBAMIDE PEROXIDE 6.5 % OT SOLN
5.0000 [drp] | Freq: Three times a day (TID) | OTIC | Status: DC | PRN
  Filled 2021-03-10: qty 15

## 2021-03-10 MED ORDER — ACETAMINOPHEN 325 MG PO TABS
650.0000 mg | ORAL_TABLET | Freq: Four times a day (QID) | ORAL | Status: DC | PRN
  Administered 2021-03-15: 650 mg via ORAL
  Filled 2021-03-10: qty 2

## 2021-03-10 MED ORDER — TAMSULOSIN HCL 0.4 MG PO CAPS
0.4000 mg | ORAL_CAPSULE | Freq: Every day | ORAL | Status: DC
  Administered 2021-03-11 – 2021-03-16 (×6): 0.4 mg via ORAL
  Filled 2021-03-10 (×6): qty 1

## 2021-03-10 MED ORDER — OXYCODONE-ACETAMINOPHEN 5-325 MG PO TABS
2.0000 | ORAL_TABLET | Freq: Once | ORAL | Status: AC
  Administered 2021-03-10: 2 via ORAL
  Filled 2021-03-10: qty 2

## 2021-03-10 MED ORDER — APIXABAN 5 MG PO TABS
5.0000 mg | ORAL_TABLET | Freq: Two times a day (BID) | ORAL | Status: DC
  Administered 2021-03-11 – 2021-03-17 (×13): 5 mg via ORAL
  Filled 2021-03-10 (×14): qty 1

## 2021-03-10 MED ORDER — IPRATROPIUM-ALBUTEROL 0.5-2.5 (3) MG/3ML IN SOLN: 3.0000 mL | Freq: Four times a day (QID) | RESPIRATORY_TRACT | Status: DC | PRN

## 2021-03-10 MED ORDER — ACETAMINOPHEN 650 MG RE SUPP: 650.0000 mg | Freq: Four times a day (QID) | RECTAL | Status: DC | PRN

## 2021-03-10 MED ORDER — SODIUM CHLORIDE 0.9 % IV BOLUS
250.0000 mL | Freq: Once | INTRAVENOUS | Status: AC
  Administered 2021-03-10: 250 mL via INTRAVENOUS

## 2021-03-10 MED ORDER — AMIODARONE HCL 200 MG PO TABS
200.0000 mg | ORAL_TABLET | Freq: Every day | ORAL | Status: DC
  Administered 2021-03-11 – 2021-03-17 (×7): 200 mg via ORAL
  Filled 2021-03-10 (×7): qty 1

## 2021-03-10 MED ORDER — SODIUM CHLORIDE 0.9 % IV SOLN: INTRAVENOUS | Status: DC

## 2021-03-10 MED ORDER — DEXTROSE 5 % IV SOLN
250.0000 mg | INTRAVENOUS | Status: DC
  Administered 2021-03-11: 250 mg via INTRAVENOUS
  Filled 2021-03-10 (×3): qty 250

## 2021-03-10 MED ORDER — SODIUM CHLORIDE 0.9% FLUSH
3.0000 mL | Freq: Once | INTRAVENOUS | Status: AC
  Administered 2021-03-10: 3 mL via INTRAVENOUS

## 2021-03-10 MED ORDER — INSULIN ASPART PROT & ASPART (70-30 MIX) 100 UNIT/ML ~~LOC~~ SUSP
5.0000 [IU] | Freq: Two times a day (BID) | SUBCUTANEOUS | Status: DC
  Administered 2021-03-11 – 2021-03-16 (×11): 5 [IU] via SUBCUTANEOUS
  Filled 2021-03-10: qty 10

## 2021-03-10 MED ORDER — DOXYCYCLINE HYCLATE 100 MG PO TABS: 100.0000 mg | ORAL_TABLET | Freq: Two times a day (BID) | ORAL | Status: DC

## 2021-03-10 NOTE — ED Triage Notes (Signed)
Pt to triage via [facility] from home.  EMS states pt is here for hematuria but also has wheezing, rales, and rhonchi.  States they attempted IV and pt was hostile with them and refused IV.  CBG 130.  Wears home O2 at 4 liters.  On arrival, pt reports sore throat and dizziness x 4 days.  Reports syncopal episode today and dysuria x 1 month.  States he was treated for a UTI 1 month ago and completed antibiotics but symptoms never completely resolved.

## 2021-03-10 NOTE — ED Notes (Signed)
Pt now reports that he fell yesterday and hit his head.  Takes Eliquis.  Acuity increased to 2.

## 2021-03-10 NOTE — H&P (Addendum)
History and Physical   Tristyn Demarest ZHY:[id number] DOB: 1946/04/22 DOA: 03/30/2021  PCP: [name], MD   Patient coming from: Home  Chief Complaint: Dizziness, dysuria, cough, increased sputum, fever  HPI: Marian Grandt is a 75 y.o. male with medical history significant of CHF with right ventricular dysfunction, anemia, CKD 3B, A. fib, CVA, chronic respiratory failure with hypoxia, COPD, CAD, diabetes, hypertension, hyperlipidemia, OSA who presents with multiple complaints including dizziness, dysuria, fevers, cough.  Patient has had dysuria which she was treated for about a month ago with antibiotics for a UTI.  States he had temporary relief from his antibiotics and then his symptoms have returned.  He also notes some intermittent fevers last 2 days well some cough and some increased sputum production similar to prior episodes of pneumonia.  He remains on his home 4 L, though reports some SOB.  He also reports some lightheadedness for the past 4 days and 1 episode of syncope where he did hit his head, which occurred upon standing and then walking to the bathroom.   He denies chills, chest pain, abdominal pain, constipation, diarrhea, nausea, vomiting.  ED Course: Vital signs in the ED initially stable with some lower normal blood pressures in the 29B to 284 systolic.  Lab work-up showed BMP with potassium of 5.2, BUN 72, creatinine 4.48 up from last check at 2.69 up from baseline of closer to 2.  CBC showed hemoglobin 8.4 which is stable for him, platelets of 448 which is higher than normal for him.  Urinalysis showed hemoglobin, leukocytes but no bacteria demonstrated.  Chest x-ray with probable pneumonia left middle and left lower lobe.  Stable patchy pneumonia versus edema in the right lower lobe.  Questionable small bilateral effusions.  CT head and CT C-spine were without acute abnormality.  Patient was started on ceftriaxone in the ED.   Review of Systems: As per HPI otherwise all other  systems reviewed and are negative.  Past Medical History:  Diagnosis Date  . Acute cardiogenic pulmonary edema ([place]) 01/26/2021  . Acute on chronic respiratory failure ([place]) 01/14/2019  . Acute systolic CHF (congestive heart failure) ([place]) 09/25/2020  . Atrial flutter ([place])    a. recurrent AFlutter with RVR;  b. Amiodarone Rx started 4/16  . CAD (coronary artery disease)    a. LHC 1/16:  mLAD diffuse disease, pLCx mild disease, dLCx with disease but too small for PCI, RCA ok, EF 25-30%  . Chronic pain   . Chronic systolic CHF (congestive heart failure) ([place])   . COPD (chronic obstructive pulmonary disease) ([place])   . Diabetes mellitus without complication ([place])   . Ex-smoker 11/2017  . Hypercholesteremia   . Hypertension   . NICM (nonischemic cardiomyopathy) ([place])    a.dx 2016. b. 2D echo 06/2016 - Last echo 07/01/16: mod dilated LV, mod LVH, EF 25-30%, mild-mod MR, sev LAE, mild-mod reduced RV systolic function, mild-mod TR, PASP 69mHG.  .Marland KitchenPAF (paroxysmal atrial fibrillation) (HCC)    On amio - ot a candidate for flecainide due to cardiomyopathy, not a candidate for Tikosyn due to prolonged QT, and felt to be a poor candidate for ablation given left atrial size.  . Pulmonary hypertension ([place]   . Sleep apnea   . Tobacco abuse     Past Surgical History:  Procedure Laterality Date  . BIOPSY  03/11/2019   Procedure: BIOPSY;  Surgeon: CLavena Bullion DO;  [place]  Service: Gastroenterology;;  . BIOPSY  03/15/2019   Procedure:  BIOPSY;  Surgeon: Irving Copas., MD;  Location: [place];  Service: Gastroenterology;;  . CARDIOVERSION N/A 07/30/2018   Procedure: CARDIOVERSION;  Surgeon: Larey Dresser, MD;  Location: [facility] ENDOSCOPY;  Service: Cardiovascular;  Laterality: N/A;  . CARDIOVERSION N/A 12/17/2020   Procedure: CARDIOVERSION;  Surgeon: Larey Dresser, MD;  Location: [facility] ENDOSCOPY;  Service: Cardiovascular;  Laterality: N/A;  . CARDIOVERSION N/A 01/10/2021    Procedure: CARDIOVERSION;  Surgeon: Larey Dresser, MD;  Location: [facility] ENDOSCOPY;  Service: Cardiovascular;  Laterality: N/A;  . COLONOSCOPY N/A 03/11/2019   Procedure: COLONOSCOPY;  Surgeon: Lavena Bullion, DO;  Location: [place];  Service: Gastroenterology;  Laterality: N/A;  . ENTEROSCOPY N/A 03/15/2019   Procedure: ENTEROSCOPY;  Surgeon: Rush Landmark Telford Nab., MD;  Location: [place];  Service: Gastroenterology;  Laterality: N/A;  . ESOPHAGOGASTRODUODENOSCOPY Left 03/11/2019   Procedure: ESOPHAGOGASTRODUODENOSCOPY (EGD);  Surgeon: Lavena Bullion, DO;  Location: [facility] ENDOSCOPY;  Service: Gastroenterology;  Laterality: Left;  . LEFT AND RIGHT HEART CATHETERIZATION WITH CORONARY ANGIOGRAM N/A 11/06/2014   Procedure: LEFT AND RIGHT HEART CATHETERIZATION WITH CORONARY ANGIOGRAM;  Surgeon: Jettie Booze, MD;  Location: [facility] CATH LAB;  Service: Cardiovascular;  Laterality: N/A;  . RIGHT/LEFT HEART CATH AND CORONARY ANGIOGRAPHY N/A 12/06/2018   Procedure: RIGHT/LEFT HEART CATH AND CORONARY ANGIOGRAPHY;  Surgeon: Larey Dresser, MD;  Location: [place] CV LAB;  Service: Cardiovascular;  Laterality: N/A;  . SPLENECTOMY    . SUBMUCOSAL TATTOO INJECTION  03/15/2019   Procedure: SUBMUCOSAL TATTOO INJECTION;  Surgeon: Irving Copas., MD;  Location: [place];  Service: Gastroenterology;;  . TEE WITHOUT CARDIOVERSION N/A 12/17/2020   Procedure: TRANSESOPHAGEAL ECHOCARDIOGRAM (TEE);  Surgeon: Larey Dresser, MD;  Location: [facility] ENDOSCOPY;  Service: Cardiovascular;  Laterality: N/A;    Social History  reports that he quit smoking about 2 years ago. His smoking use included cigarettes. He has a 34.00 pack-year smoking history. He has never used smokeless tobacco. He reports that he does not drink alcohol and does not use drugs.  Allergies  Allergen Reactions  . Isosorb Dinitrate-Hydralazine Shortness Of Breath  . Benazepril Nausea And Vomiting  . Brimonidine Other (See  Comments)    Dry eyes  . Metformin Other (See Comments)    Cramps and abdominal pain  . Morphine Rash    Family History  Problem Relation Age of Onset  . Heart disease Mother   . Hypertension Mother   . Heart failure Mother   . Heart disease Father   . Hypertension Sister   . Heart attack Neg Hx   . Stroke Neg Hx   . Colon cancer Neg Hx   . Esophageal cancer Neg Hx   Reviewed on admission  Prior to Admission medications   Medication Sig Start Date End Date Taking? Authorizing Provider  acetaminophen (TYLENOL) 500 MG tablet Take 500 mg by mouth every 6 (six) hours as needed for moderate pain.    [provider]  albuterol (VENTOLIN HFA) 108 (90 Base) MCG/ACT inhaler Inhale 2 puffs into the lungs every 6 (six) hours as needed for wheezing or shortness of breath.    [provider]  amiodarone (PACERONE) 200 MG tablet Take 1 tablet (200 mg total) by mouth daily. 02/03/21   Almyra Deforest, PA  apixaban (ELIQUIS) 5 MG TABS tablet Take 1 tablet (5 mg total) by mouth 2 (two) times daily. 02/03/21   Almyra Deforest, PA  ascorbic acid (VITAMIN C) 500 MG tablet Take 500 mg by mouth  daily. 07/03/20   [provider]  atorvastatin (LIPITOR) 40 MG tablet Take 1 tablet (40 mg total) by mouth daily. 10/30/20   Larey Dresser, MD  Budeson-Glycopyrrol-Formoterol (BREZTRI AEROSPHERE) 160-9-4.8 MCG/ACT AERO Inhale 2 puffs into the lungs 2 (two) times daily. 06/21/20   Tanda Rockers, MD  carbamide peroxide (DEBROX) 6.5 % OTIC solution Place 5-10 drops into both ears 3 (three) times daily as needed (ear wax).    [provider]  Carboxymethylcellulose Sod PF 0.25 % SOLN Place 1 drop into both eyes 2 (two) times daily as needed (dry eyes).    [provider]  Cholecalciferol (VITAMIN D) 50 MCG (2000 UT) CAPS Take 2,000 Units by mouth daily.    [provider]  feeding supplement, GLUCERNA SHAKE, (GLUCERNA SHAKE) LIQD Take 237 mLs by mouth 3 (three) times daily  between meals. 11/27/20   Swayze, Ava, DO  ferrous sulfate 325 (65 FE) MG tablet Take 325 mg by mouth every Monday, Wednesday, and Friday at 6 PM.    [provider]  fluticasone (FLONASE) 50 MCG/ACT nasal spray Place 2 sprays into both nostrils in the morning and at bedtime.    [provider]  gabapentin (NEURONTIN) 300 MG capsule Take 300 mg by mouth daily as needed (pain). 12/03/20   [provider]  hydrocortisone (ANUSOL-HC) 2.5 % rectal cream Place 1 application rectally 2 (two) times daily as needed for hemorrhoids or itching.     [provider]  insulin aspart protamine- aspart (NOVOLOG MIX 70/30) (70-30) 100 UNIT/ML injection Inject 10 Units into the skin 2 (two) times daily with a meal.    [provider]  Insulin Syringe 27G X 1/2" 0.5 ML MISC 100 Syringes by Does not apply route 3 (three) times daily. 08/29/19   Hosie Poisson, MD  ipratropium-albuterol (DUONEB) 0.5-2.5 (3) MG/3ML SOLN Take 3 mLs by nebulization 4 (four) times daily as needed (sob/wheezing).    [provider]  latanoprost (XALATAN) 0.005 % ophthalmic solution Place 1 drop into both eyes at bedtime.    [provider]  metolazone (ZAROXOLYN) 2.5 MG tablet Take 1 tablet (2.5 mg total) by mouth once a week. Take 1 tablet once a week on Monday, taken 30 min prior to morning torsemide 02/03/21   Almyra Deforest, PA  Nystatin (GERHARDT'S BUTT CREAM) CREA Apply 1 application topically 2 (two) times daily. 01/10/21   Nita Sells, MD  oxyCODONE-acetaminophen (PERCOCET) 10-325 MG tablet Take 1 tablet by mouth every 6 (six) hours.    [provider]  OXYGEN Inhale 4 L/min into the lungs continuous.    [provider]  pantoprazole (PROTONIX) 40 MG tablet Take 40 mg by mouth 2 (two) times daily. 07/03/20   [provider]  polyethylene glycol powder (GLYCOLAX/MIRALAX) 17 GM/SCOOP powder Take 17 g by mouth daily as needed for constipation. 07/03/20    [provider]  potassium chloride SA (KLOR-CON) 20 MEQ tablet Take 1 tablet (20 mEq total) by mouth 2 (two) times daily. Take extra 43mq (2 tablets) every Monday with metolazone 02/03/21   MAlmyra Deforest PA  rosuvastatin (CRESTOR) 40 MG tablet Take 20 mg by mouth at bedtime. 07/03/20   [provider]  senna-docusate (SENOKOT-S) 8.6-50 MG tablet Take 1 tablet by mouth at bedtime as needed for mild constipation. 07/03/20   [provider]  tamsulosin (FLOMAX) 0.4 MG CAPS capsule Take 1 capsule (0.4 mg total) by mouth daily. 10/09/20   VGeradine Girt DO  Torsemide 40 MG TABS Take 80 mg by mouth daily. 02/15/21   Larey Dresser, MD    Physical Exam: Vitals:   03/04/2021 1446 03/14/2021 1700  BP: (!) 103/53 (!) 91/45  Pulse: 74   Resp: 18 (!) 30  Temp: 98.4 F (36.9 C)   TempSrc: Oral   SpO2: 97%    Physical Exam Constitutional:      General: He is not in acute distress.    Appearance: Normal appearance. He is obese.  HENT:     Head: Normocephalic and atraumatic.     Mouth/Throat:     Mouth: Mucous membranes are moist.     Pharynx: Oropharynx is clear.  Eyes:     Extraocular Movements: Extraocular movements intact.     Pupils: Pupils are equal, round, and reactive to light.  Cardiovascular:     Rate and Rhythm: Normal rate and regular rhythm.     Pulses: Normal pulses.     Heart sounds: Normal heart sounds.  Pulmonary:     Effort: Pulmonary effort is normal. No respiratory distress.     Breath sounds: Rhonchi and rales present.  Abdominal:     General: Bowel sounds are normal. There is no distension.     Palpations: Abdomen is soft.     Tenderness: There is no abdominal tenderness.  Musculoskeletal:        General: No swelling or deformity.  Skin:    General: Skin is warm and dry.  Neurological:     General: No focal deficit present.     Mental Status: Mental status is at baseline.    Labs on Admission: I have personally reviewed following labs and  imaging studies  CBC: Recent Labs  Lab 04/01/2021 1619  WBC 6.2  HGB 8.4*  HCT 28.3*  MCV 85.8  PLT 448*    Basic Metabolic Panel: Recent Labs  Lab 03/13/2021 1619  NA 140  K 5.2*  CL 104  CO2 27  GLUCOSE 53*  BUN 72*  CREATININE 4.48*  CALCIUM 8.3*    GFR: CrCl cannot be calculated (Unknown ideal weight.).  Liver Function Tests: No results for input(s): AST, ALT, ALKPHOS, BILITOT, PROT, ALBUMIN in the last 168 hours.  Urine analysis:    Component Value Date/Time   COLORURINE YELLOW 03/11/2021 1646   APPEARANCEUR CLOUDY (A) 03/23/2021 1646   LABSPEC 1.008 03/11/2021 1646   PHURINE 6.0 03/25/2021 1646   GLUCOSEU NEGATIVE 03/15/2021 1646   HGBUR MODERATE (A) 03/24/2021 1646   BILIRUBINUR NEGATIVE 03/07/2021 1646   KETONESUR NEGATIVE 03/08/2021 1646   PROTEINUR NEGATIVE 03/18/2021 1646   UROBILINOGEN 0.2 10/26/2014 2206   NITRITE NEGATIVE 03/18/2021 1646   LEUKOCYTESUR LARGE (A) 03/04/2021 1646    Radiological Exams on Admission: CT Head Wo Contrast  Result Date: 03/20/2021 CLINICAL DATA:  Fall with head and neck pain. EXAM: CT HEAD WITHOUT CONTRAST CT CERVICAL SPINE WITHOUT CONTRAST TECHNIQUE: Multidetector CT imaging of the head and cervical spine was performed following the standard protocol without intravenous contrast. Multiplanar CT image reconstructions of the cervical spine were also generated. COMPARISON:  CT head dated 12/11/2020. FINDINGS: CT HEAD FINDINGS Brain: No evidence of acute infarction, hemorrhage, hydrocephalus, extra-axial collection or mass lesion/mass effect. There is mild cerebral volume loss with associated ex vacuo dilatation. Periventricular white matter hypoattenuation likely represents chronic small vessel ischemic disease. Vascular: There are vascular calcifications in the carotid siphons. Skull: Normal. Negative for fracture or focal lesion. Sinuses/Orbits: No acute finding. Other: None. CT CERVICAL SPINE  FINDINGS Alignment: Gentle cervical  kyphosis is likely positional. Skull base and vertebrae: No acute fracture. No primary bone lesion or focal pathologic process. Soft tissues and spinal canal: No prevertebral fluid or swelling. No visible canal hematoma. Disc levels: Up to moderate multilevel degenerative disc and joint disease. Upper chest: Bilateral pleural effusions with associated atelectasis are partially imaged. Other: None. IMPRESSION: 1. No acute intracranial process. 2. No acute osseous injury in the cervical spine. 3. Bilateral pleural effusions are partially imaged. Electronically Signed   By: Zerita Boers M.D.   On: 03/22/2021 17:34   CT Cervical Spine Wo Contrast  Result Date: 03/09/2021 CLINICAL DATA:  Fall with head and neck pain. EXAM: CT HEAD WITHOUT CONTRAST CT CERVICAL SPINE WITHOUT CONTRAST TECHNIQUE: Multidetector CT imaging of the head and cervical spine was performed following the standard protocol without intravenous contrast. Multiplanar CT image reconstructions of the cervical spine were also generated. COMPARISON:  CT head dated 12/11/2020. FINDINGS: CT HEAD FINDINGS Brain: No evidence of acute infarction, hemorrhage, hydrocephalus, extra-axial collection or mass lesion/mass effect. There is mild cerebral volume loss with associated ex vacuo dilatation. Periventricular white matter hypoattenuation likely represents chronic small vessel ischemic disease. Vascular: There are vascular calcifications in the carotid siphons. Skull: Normal. Negative for fracture or focal lesion. Sinuses/Orbits: No acute finding. Other: None. CT CERVICAL SPINE FINDINGS Alignment: Gentle cervical kyphosis is likely positional. Skull base and vertebrae: No acute fracture. No primary bone lesion or focal pathologic process. Soft tissues and spinal canal: No prevertebral fluid or swelling. No visible canal hematoma. Disc levels: Up to moderate multilevel degenerative disc and joint disease. Upper chest: Bilateral pleural effusions with associated  atelectasis are partially imaged. Other: None. IMPRESSION: 1. No acute intracranial process. 2. No acute osseous injury in the cervical spine. 3. Bilateral pleural effusions are partially imaged. Electronically Signed   By: Zerita Boers M.D.   On: 03/05/2021 17:34   DG Chest Port 1 View  Result Date: 03/21/2021 CLINICAL DATA:  Syncope.  Cough for the past 3 days. EXAM: PORTABLE CHEST 1 VIEW COMPARISON:  None. FINDINGS: Mild increase in dense airspace opacity in the left mid and lower lung zones. Mild patchy airspace opacity in the right lower lung zone without significant change. The interstitial markings remain diffusely prominent. Grossly stable borderline enlarged cardiac silhouette. Possible left pleural effusion and minimal right pleural effusion. Diffuse osteopenia. IMPRESSION: 1. Mildly increased probable pneumonia in the left mid and lower lung zones. 2. Stable mild patchy pneumonia or alveolar edema in the right lower lung zone. 3. Possible small left pleural effusion and minimal right pleural effusion. 4. Stable interstitial lung disease. Electronically Signed   By: Claudie Revering M.D.   On: 03/28/2021 16:51   EKG: Independently reviewed.  Sinus rhythm with first-degree AV block.  Borderline right bundle branch block.  Low voltage.  Some nonspecific T wave abnormalities.  QTc 468.  Assessment/Plan Principal Problem:   Acute renal failure superimposed on stage 3b chronic kidney disease (HCC) Active Problems:   Essential hypertension   Type 2 diabetes mellitus with neuropathy   Hyperlipidemia    PAF (paroxysmal atrial fibrillation) (HCC)   Coronary artery disease involving native coronary artery of native heart   Chronic systolic CHF (congestive heart failure) (HCC)   COPD (chronic obstructive pulmonary disease) (HCC)   OSA (obstructive sleep apnea)   Chronic respiratory failure with hypoxia (HCC)   RVF (right ventricular failure) (HCC)   Iron deficiency anemia   CAP (community acquired  pneumonia)  AKI on CKD 3B Hyperkalemia > Creatinine elevated to 4.48 up from recent of 2.69 and from baseline that may be closer to 2. > Mild hyperkalemia noted in the ED at 5.2. > This is likely in the setting of acute illness and possible overdiuresis as he does appear dry on exam.  We will proceed with gentle IV fluids given his history of right heart failure. - Monitor on telemetry - 250 cc bolus with 75 cc rate to follow. - Check BNP and magnesium as below - Avoid nephrotoxic agents - Holding home diuretics for now (torsemide and metolazone) - Trend renal function and electrolytes  Pneumonia ?UTI > Patient complaining of recurrent UTI-like symptoms that temporarily responded to outpatient antibiotics however no bacteria noted on urinalysis. > Does have evidence of pneumonia on chest x-ray and reports intermittent fevers and increased cough. - Continue with ceftriaxone and azithromycin for commune acquired pneumonia which will also cover for any possible UTI - Follow-up urine culture -Continue treatments for COPD and CHF as below  Syncopal event > Reports some intermittent dizziness recently an episode of syncope where he hit his head.  Is on blood thinners. > CT head and neck and CT C-spine without acute abnormality. > Likely due to dehydration/orthostasis > Does have recent TEE in February so will not repeat this. - Check orthostatics - Monitor on telemetry - IV fluids as mentioned elsewhere  A. Fib > Status post TEE cardioversion in February -Continue home amiodarone and Eliquis  COPD > Remains on home 4 L despite increased cough and sputum production. > Treating for probable pneumonia as above.  We will continue with home inhalers and as needed nebs. - Continue home Breztri and albuterol - As needed DuoNeb  CHF Hypertension > History of CHF with right heart failure.  Last echo in 21 with EF 50-55% and severely reduced RVEF.  TEE in February with EF 45-50% with  global hypokinesis and severely reduced RVEF > Clinically appears euvolemic or dry.  Also with increased platelets possibly indicating hemoconcentration.  We will treat with gentle IV fluids he has had AKI responding to IV fluids in the past. > Reports his dry weight is 218 and is currently 219 on a scale today  - Monitor on telemetry as above - Check magnesium and BNP - Hold torsemide and metolazone for now - Strict I's/O, daily weights - Continue to monitor renal function and electrolytes  Anemia  > Hemoglobin stable at 8.4.  On Eliquis. - Some hemoglobin noted in urine, but Hgb stable as above. - Continue home Iron - Trend CBC  Diabetes > On 70/30, 10 units twice daily at home - Continue 70/30 5 units twice daily - SSI  Chronic pain - Replace home oxycodone with hydrocodone given worsening AKI  CAD History of CVA Hyperlipidemia  - Continue home atorvastatin  GERD - Continue PPI  BPH - Continue tamsulosin  OSA - Continue home CPAP  DVT prophylaxis: Eliquis  Code Status:   Full  Family Communication:  Wife updated by phone  Disposition Plan:   Patient is from:  Home  Anticipated DC to:  Home  Anticipated DC date:  1 to 3 days  Anticipated DC barriers: None  Consults called:  None  Admission status:  Observation, telemetry  Severity of Illness: The appropriate patient status for this patient is OBSERVATION. Observation status is judged to be reasonable and necessary in order to provide the required intensity of service to ensure the patient's safety.  The patient's presenting symptoms, physical exam findings, and initial radiographic and laboratory data in the context of their medical condition is felt to place them at decreased risk for further clinical deterioration. Furthermore, it is anticipated that the patient will be medically stable for discharge from the hospital within 2 midnights of admission. The following factors support the patient status of observation.    " The patient's presenting symptoms include hematuria, cough, fevers, light headedness. " The physical exam findings include rhonchi and some rales. " The initial radiographic and laboratory data are concerning for creatinine 4.48 from baseline of closer to 2.  Potassium 5.2.  Hemoglobin 8.4 which is stable.  Platelets 448 which is above normal.  Urinalysis with hemoglobin, leukocytes but no bacteria.  Chest x-ray with probable pneumonia in the left middle and left lower lobes, stable patchy pneumonia versus edema in the right lower lobe, small questionable effusions.   Marcelyn Bruins MD [facility]  How to contact the [facility] Attending or Consulting provider [place] or covering provider during after hours [place], for this patient?   1. Check the care team in [facility] and look for a) attending/consulting [facility] provider listed and b) the [facility] team listed 2. Log into [email or web address] and use [place]'s universal password to access. If you do not have the password, please contact the hospital operator. 3. Locate the [facility] provider you are looking for under [facility] and page to a number that you can be directly reached. 4. If you still have difficulty reaching the provider, please page the [facility] (Director on Call) for the Hospitalists listed on amion for assistance.  04/01/2021, 7:18 PM

## 2021-03-10 NOTE — ED Provider Notes (Signed)
MSE was initiated and I personally evaluated the patient and placed orders (if any) at  3:11 PM on Mar 10, 2021.  The patient appears stable so that the remainder of the MSE may be completed by another provider.   Chief Complaint: SOB, weakness, UTI symptoms, syncope    HPI:  Pt states that he has had SOB for a month, worsened over the past couple of days. Also admits to hematuria and dysuria over the past couple of days. Also mentions that he passsed out yesterday, out for a minute. Did hit his head. Happened in the morning. Is on Eliquis.   ROS: SOB, weakness, UTI symptoms, syncope    Physical Exam:  Gen:                Awake, no distress  HEENT:          Atraumatic  Resp:              Normal effort  Cardiac:          Normal rate  Abd:                Nondistended, nontender  MSK:              Moves extremities without difficulty  Neuro:            Speech clear,EOMS intact, no facial droop, moving all extremities, negative pronator drift.    Pt to be made level 2. Would not be level 2 trauma since happened over 24 hours ago.  Initiation of care has begun. The patient has been counseled on the process, plan, and necessity for staying for the completion/evaluation, and the remainder of the medical screening examination    Alfredia Client, PA-C 03/21/2021 [id number]    Daleen Bo, MD 03/15/2021 [id number]

## 2021-03-10 NOTE — ED Notes (Signed)
Pt requesting  A food tray  ordered

## 2021-03-10 NOTE — ED Notes (Signed)
Admitting doctor at  The bedside

## 2021-03-10 NOTE — ED Provider Notes (Signed)
[facility] EMERGENCY DEPARTMENT Provider Note   CSN: [id number] Arrival date & time: 03/29/2021  1422     History Chief Complaint  Patient presents with  . Shortness of Breath  . Dizziness  . Sore Throat    Nicholas Caldwell is a 75 y.o. male.  HPI 75 year old male with history of a flutter, CAD, COPD, CHF, diabetes, hypertension, hyperlipidemia,, atrial fibrillation, and CKD presents the emergency department for moderate dysuria.  States has been present for about a week, constant and worsening.  Does endorse occasional hematuria as well.  Nothing makes it better, nothing makes it worse.  Similar to previous urinary tract infections.  States he just finished a course of antibiotics and got better for a few days, but then symptoms returned.  Endorses intermittent fevers for the last 2 days.  Additionally, states the last 3 days has had increased cough without sputum production along with shortness of breath.  Is not requiring more oxygen than normal.  States similar to previous pneumonia.  Has not taken anything for the symptoms.  States that the cough and shortness of breath are moderate.    Past Medical History:  Diagnosis Date  . Acute cardiogenic pulmonary edema ([place]) 01/26/2021  . Acute on chronic respiratory failure ([place]) 01/14/2019  . Acute systolic CHF (congestive heart failure) ([place]) 09/25/2020  . Atrial flutter ([place])    a. recurrent AFlutter with RVR;  b. Amiodarone Rx started 4/16  . CAD (coronary artery disease)    a. LHC 1/16:  mLAD diffuse disease, pLCx mild disease, dLCx with disease but too small for PCI, RCA ok, EF 25-30%  . Chronic pain   . Chronic systolic CHF (congestive heart failure) ([place])   . COPD (chronic obstructive pulmonary disease) ([place])   . Diabetes mellitus without complication ([place])   . Ex-smoker 11/2017  . Hypercholesteremia   . Hypertension   . NICM (nonischemic cardiomyopathy) ([place])    a.dx 2016. b. 2D echo 06/2016 - Last echo 07/01/16:  mod dilated LV, mod LVH, EF 25-30%, mild-mod MR, sev LAE, mild-mod reduced RV systolic function, mild-mod TR, PASP 55mHG.  .Marland KitchenPAF (paroxysmal atrial fibrillation) (HCC)    On amio - ot a candidate for flecainide due to cardiomyopathy, not a candidate for Tikosyn due to prolonged QT, and felt to be a poor candidate for ablation given left atrial size.  . Pulmonary hypertension ([place] [facility] [place]   . Sleep apnea   . Tobacco abuse     Patient Active Problem List   Diagnosis Date Noted  . Acute renal failure superimposed on stage 3b chronic kidney disease ([place] 03/29/2021  . CAP (community acquired pneumonia) 03/26/2021  . Uncontrolled type 2 diabetes mellitus with hyperglycemia, with long-term current use of insulin ([facility] 01/27/2021  . Leg weakness, bilateral 01/27/2021  . Chronic kidney disease, stage 3b ([place] 01/06/2021  . Severe sepsis ([place] 12/11/2020  . Acute renal failure superimposed on chronic kidney disease ([place] 12/11/2020  . Abdominal pain 12/11/2020  . Stage III pressure ulcer of sacral region (HO[place] 10/01/2020  . Advance care planning   . Pressure ulcer of left buttock 08/21/2019  . Cardiac arrest ([place] 08/20/2019  . Palliative care by specialist   . DNR (do not resuscitate) discussion   . Fatigue   . Hyperkalemia 04/10/2019  . Cerebral embolism with cerebral infarction 03/21/2019  . Gastritis and gastroduodenitis   . NSVT (nonsustained ventricular tachycardia) ([place] 03/08/2019  . Tobacco abuse counseling 03/08/2019  . Iron deficiency anemia   .  Mixed diabetic hyperlipidemia associated with type 2 diabetes mellitus ([place])   . Primary osteoarthritis of right hip   . Hypomagnesemia   . Steroid-induced hyperglycemia   . Supplemental oxygen dependent   . Acute on chronic anemia   . Diabetic peripheral neuropathy ([place])   . Hypoxia   . Altered mental status   . RVF (right ventricular failure) ([place]) 12/30/2018  . AKI (acute kidney injury) ([place])   . Acute respiratory failure ([place])  11/22/2018  . Acute on chronic respiratory failure with hypoxia and hypercapnia ([place]) 11/13/2018  . Metabolic encephalopathy 64/33/2951  . Chronic respiratory failure with hypoxia ([place]) 12/28/2017  . Acute metabolic encephalopathy   . Atrial fibrillation with RVR ([place])   . SVT (supraventricular tachycardia) ([place])   . COPD GOLD 0   . Medically noncompliant   . Panlobular emphysema ([place])   . OSA (obstructive sleep apnea)   . Pulmonary hypertension ([place])   . Disorientation   . Pressure injury of skin 09/07/2016  . Acute on chronic combined systolic and diastolic CHF (congestive heart failure) ([place]) 09/05/2016  . Skin lesion-left heal 09/05/2016  . Chronic systolic CHF (congestive heart failure) ([place]) 07/16/2016  . COPD (chronic obstructive pulmonary disease) ([place]) 07/16/2016  . Uncontrolled type 2 diabetes mellitus with complication ([place])   . Diabetic polyneuropathy associated with diabetes mellitus due to underlying condition ([place])   . Normocytic anemia 06/29/2016  . Coronary artery disease involving native coronary artery of native heart 01/21/2016  . Nonischemic cardiomyopathy ([place]) 10/09/2015  . PAF (paroxysmal atrial fibrillation) ([place])   . Lactic acidosis 02/19/2015  . Chronic pain 02/19/2015  . Morbid obesity ([place]) 02/13/2015  . SOB (shortness of breath)   . Elevated troponin I level 11/01/2014  . COPD with acute exacerbation ([place]) 11/01/2014  . Essential hypertension 10/31/2014  . Type 2 diabetes mellitus with neuropathy 10/31/2014  . Hyperlipidemia  10/31/2014  . Cigarette smoker 10/31/2014    Past Surgical History:  Procedure Laterality Date  . BIOPSY  03/11/2019   Procedure: BIOPSY;  Surgeon: Lavena Bullion, DO;  Location: [facility];  Service: Gastroenterology;;  . BIOPSY  03/15/2019   Procedure: BIOPSY;  Surgeon: Irving Copas., MD;  Location: [facility];  Service: Gastroenterology;;  . CARDIOVERSION N/A 07/30/2018   Procedure: CARDIOVERSION;   Surgeon: Larey Dresser, MD;  Location: [facility] ENDOSCOPY;  Service: Cardiovascular;  Laterality: N/A;  . CARDIOVERSION N/A 12/17/2020   Procedure: CARDIOVERSION;  Surgeon: Larey Dresser, MD;  Location: [facility] ENDOSCOPY;  Service: Cardiovascular;  Laterality: N/A;  . CARDIOVERSION N/A 01/10/2021   Procedure: CARDIOVERSION;  Surgeon: Larey Dresser, MD;  Location: [facility] ENDOSCOPY;  Service: Cardiovascular;  Laterality: N/A;  . COLONOSCOPY N/A 03/11/2019   Procedure: COLONOSCOPY;  Surgeon: Lavena Bullion, DO;  Location: [facility];  Service: Gastroenterology;  Laterality: N/A;  . ENTEROSCOPY N/A 03/15/2019   Procedure: ENTEROSCOPY;  Surgeon: Rush Landmark Telford Nab., MD;  Location: [facility];  Service: Gastroenterology;  Laterality: N/A;  . ESOPHAGOGASTRODUODENOSCOPY Left 03/11/2019   Procedure: ESOPHAGOGASTRODUODENOSCOPY (EGD);  Surgeon: Lavena Bullion, DO;  Location[facility] ENDOSCOPY;  Service: Gastroenterology;  Laterality: Left;  . LEFT AND RIGHT HEART CATHETERIZATION WITH CORONARY ANGIOGRAM N/A 11/06/2014   Procedure: LEFT AND RIGHT HEART CATHETERIZATION WITH CORONARY ANGIOGRAM;  Surgeon: Jettie Booze, MD;  Location: [facility] CATH LAB;  Service: Cardiovascular;  Laterality: N/A;  . RIGHT/LEFT HEART CATH AND CORONARY ANGIOGRAPHY N/A 12/06/2018   Procedure: RIGHT/LEFT HEART CATH AND CORONARY ANGIOGRAPHY;  Surgeon: Larey Dresser,  MD;  Location: [facility] CV LAB;  Service: Cardiovascular;  Laterality: N/A;  . SPLENECTOMY    . SUBMUCOSAL TATTOO INJECTION  03/15/2019   Procedure: SUBMUCOSAL TATTOO INJECTION;  Surgeon: Irving Copas., MD;  Location: [facility];  Service: Gastroenterology;;  . TEE WITHOUT CARDIOVERSION N/A 12/17/2020   Procedure: TRANSESOPHAGEAL ECHOCARDIOGRAM (TEE);  Surgeon: Larey Dresser, MD;  Location: [facility] ENDOSCOPY;  Service: Cardiovascular;  Laterality: N/A;       Family History  Problem Relation Age of Onset  . Heart disease Mother   . Hypertension Mother   .  Heart failure Mother   . Heart disease Father   . Hypertension Sister   . Heart attack Neg Hx   . Stroke Neg Hx   . Colon cancer Neg Hx   . Esophageal cancer Neg Hx     Social History   Tobacco Use  . Smoking status: Former Smoker    Packs/day: 1.00    Years: 34.00    Pack years: 34.00    Types: Cigarettes    Quit date: 11/30/2018    Years since quitting: 2.2  . Smokeless tobacco: Never Used  Vaping Use  . Vaping Use: Never used  Substance Use Topics  . Alcohol use: No    Alcohol/week: 0.0 standard drinks  . Drug use: No    Home Medications Prior to Admission medications   Medication Sig Start Date End Date Taking? Authorizing Provider  albuterol (VENTOLIN HFA) 108 (90 Base) MCG/ACT inhaler Inhale 2 puffs into the lungs every 6 (six) hours as needed for wheezing or shortness of breath.   Yes [provider]  amiodarone (PACERONE) 200 MG tablet Take 1 tablet (200 mg total) by mouth daily. 02/03/21  Yes Almyra Deforest, PA  apixaban (ELIQUIS) 5 MG TABS tablet Take 1 tablet (5 mg total) by mouth 2 (two) times daily. 02/03/21  Yes Almyra Deforest, PA  ascorbic acid (VITAMIN C) 500 MG tablet Take 500 mg by mouth daily. 07/03/20  Yes [provider]  atorvastatin (LIPITOR) 40 MG tablet Take 1 tablet (40 mg total) by mouth daily. 10/30/20  Yes Larey Dresser, MD  Budeson-Glycopyrrol-Formoterol (BREZTRI AEROSPHERE) 160-9-4.8 MCG/ACT AERO Inhale 2 puffs into the lungs 2 (two) times daily. Patient taking differently: Inhale 2 puffs into the lungs 2 (two) times daily as needed (For shortness of breath). 06/21/20  Yes Tanda Rockers, MD  carbamide peroxide (DEBROX) 6.5 % OTIC solution Place 5-10 drops into both ears 3 (three) times daily as needed (ear wax).   Yes [provider]  Carboxymethylcellulose Sod PF 0.25 % SOLN Place 1 drop into both eyes 2 (two) times daily as needed (dry eyes).   Yes [provider]  Cholecalciferol (VITAMIN D) 50 MCG (2000 UT) CAPS Take  2,000 Units by mouth daily.   Yes [provider]  feeding supplement, GLUCERNA SHAKE, (GLUCERNA SHAKE) LIQD Take 237 mLs by mouth 3 (three) times daily between meals. 11/27/20  Yes Swayze, Ava, DO  ferrous sulfate 325 (65 FE) MG tablet Take 325 mg by mouth every Monday, Wednesday, and Friday at 6 PM.   Yes [provider]  fluticasone (FLONASE) 50 MCG/ACT nasal spray Place 1 spray into both nostrils at bedtime.   Yes [provider]  fluticasone furoate-vilanterol (BREO ELLIPTA) 100-25 MCG/INH AEPB Inhale 1 puff into the lungs daily.   Yes [provider]  gabapentin (NEURONTIN) 300 MG capsule Take 300 mg by mouth daily as needed (pain). 12/03/20  Yes [provider]  hydrocortisone (ANUSOL-HC) 2.5 % rectal cream Place 1 application rectally 2 (two) times daily as needed for hemorrhoids or itching.    Yes [provider]  ibuprofen (ADVIL) 200 MG tablet Take 200 mg by mouth every 6 (six) hours as needed for mild pain.   Yes [provider]  insulin aspart protamine- aspart (NOVOLOG MIX 70/30) (70-30) 100 UNIT/ML injection Inject 10 Units into the skin 2 (two) times daily with a meal.   Yes [provider]  ipratropium-albuterol (DUONEB) 0.5-2.5 (3) MG/3ML SOLN Take 3 mLs by nebulization 4 (four) times daily as needed (sob/wheezing).   Yes [provider]  latanoprost (XALATAN) 0.005 % ophthalmic solution Place 1 drop into both eyes at bedtime.   Yes [provider]  metolazone (ZAROXOLYN) 2.5 MG tablet Take 1 tablet (2.5 mg total) by mouth once a week. Take 1 tablet once a week on Monday, taken 30 min prior to morning torsemide 02/03/21  Yes Meng, [place], [place]  Nystatin (GERHARDT'S BUTT CREAM) CREA Apply 1 application topically 2 (two) times daily. 01/10/21  Yes Nita Sells, MD  oxyCODONE-acetaminophen (PERCOCET) 10-325 MG tablet Take 1 tablet by mouth every 6 (six) hours as needed for pain.   Yes [provider]  pantoprazole (PROTONIX) 40 MG tablet Take 40 mg by mouth 2 (two) times daily. 07/03/20  Yes [provider]  polyethylene glycol powder (GLYCOLAX/MIRALAX) 17 GM/SCOOP powder Take 17 g by mouth daily as needed for constipation. 07/03/20  Yes [provider]  potassium chloride SA (KLOR-CON) 20 MEQ tablet Take 1 tablet (20 mEq total) by mouth 2 (two) times daily. Take extra 58mq (2 tablets) every Monday with metolazone 02/03/21  Yes MAlmyra Deforest PA  rosuvastatin (CRESTOR) 40 MG tablet Take 20 mg by mouth at bedtime. 07/03/20  Yes [provider]  senna-docusate (SENOKOT-S) 8.6-50 MG tablet Take 1 tablet by mouth at bedtime as needed for mild constipation. 07/03/20  Yes [provider]  tamsulosin (FLOMAX) 0.4 MG CAPS capsule Take 1 capsule (0.4 mg total) by mouth daily. 10/09/20  Yes Vann, Jessica U, DO  tiotropium (SPIRIVA) 18 MCG inhalation capsule Place 18 mcg into inhaler and inhale daily.   Yes [provider]  Torsemide 40 MG TABS Take 80 mg by mouth daily. 02/15/21  Yes MLarey Dresser MD  Insulin Syringe 27G X 1/2" 0.5 ML MISC 100 Syringes by Does not apply route 3 (three) times daily. 08/29/19   AHosie Poisson MD  OXYGEN Inhale 4 L/min into the lungs continuous.    [provider]    Allergies    Isosorb dinitrate-hydralazine, Benazepril, Brimonidine, Metformin, and Morphine  Review of Systems   Review of Systems  Constitutional: Positive for chills and fever.  HENT: Positive for sore throat. Negative for ear pain.   Eyes: Negative for pain and visual disturbance.  Respiratory: Positive for cough and shortness of breath.   Cardiovascular: Negative for chest pain and palpitations.  Gastrointestinal: Positive for nausea. Negative for abdominal pain and vomiting.  Genitourinary: Positive for decreased urine volume, dysuria and hematuria.  Musculoskeletal: Negative for arthralgias and back pain.  Skin: Negative for color  change and rash.  Neurological: Negative for seizures and syncope.  All other systems reviewed and are negative.   Physical Exam Updated Vital Signs BP (!) 91/45   Pulse 74   Temp 98.4 F (36.9 C) (Oral)   Resp (!) 30   SpO2 97%   Physical Exam  Vitals and nursing note reviewed.  Constitutional:      Appearance: He is well-developed.  HENT:     Head: Normocephalic and atraumatic.     Mouth/Throat:     Mouth: Mucous membranes are moist.     Pharynx: No pharyngeal swelling or oropharyngeal exudate.  Eyes:     Extraocular Movements: Extraocular movements intact.     Conjunctiva/sclera: Conjunctivae normal.  Cardiovascular:     Rate and Rhythm: Normal rate and regular rhythm.     Heart sounds: No murmur heard.   Pulmonary:     Effort: Pulmonary effort is normal. No respiratory distress.     Breath sounds: Examination of the left-lower field reveals rales. Rales present.  Chest:     Chest wall: No tenderness.  Abdominal:     Palpations: Abdomen is soft.     Tenderness: There is no abdominal tenderness.  Musculoskeletal:        General: Normal range of motion.     Cervical back: Neck supple.     Right lower leg: No tenderness. No edema.     Left lower leg: No tenderness. No edema.  Skin:    General: Skin is warm and dry.     Capillary Refill: Capillary refill takes less than 2 seconds.  Neurological:     General: No focal deficit present.     Mental Status: He is alert and oriented to person, place, and time.  Psychiatric:        Mood and Affect: Mood normal.     ED Results / Procedures / Treatments   Labs (all labs ordered are listed, but only abnormal results are displayed) Labs Reviewed  BASIC METABOLIC PANEL - Abnormal; Notable for the following components:      Result Value   Potassium 5.2 (*)    Glucose, Bld 53 (*)    BUN 72 (*)    Creatinine, Ser 4.48 (*)    Calcium 8.3 (*)    GFR, Estimated 13 (*)    All other components within normal limits  CBC -  Abnormal; Notable for the following components:   RBC 3.30 (*)    Hemoglobin 8.4 (*)    HCT 28.3 (*)    MCH 25.5 (*)    MCHC 29.7 (*)    RDW 19.8 (*)    Platelets 448 (*)    All other components within normal limits  URINALYSIS, ROUTINE W REFLEX MICROSCOPIC - Abnormal; Notable for the following components:   APPearance CLOUDY (*)    Hgb urine dipstick MODERATE (*)    Leukocytes,Ua LARGE (*)    WBC, UA >50 (*)    All other components within normal limits  RESP PANEL BY RT-PCR (FLU A&B, COVID) ARPGX2  URINE CULTURE  MAGNESIUM  BRAIN NATRIURETIC PEPTIDE  CBC  BASIC METABOLIC PANEL  CBG MONITORING, ED    EKG EKG Interpretation  Date/Time:  Sunday Mar 10 2021 14:35:24 EDT Ventricular Rate:  72 PR Interval:  236 QRS Duration: 108 QT Interval:  428 QTC Calculation: 468 R Axis:   114 Text Interpretation: Sinus rhythm with 1st degree A-V block Right axis deviation Incomplete right bundle branch block Right ventricular hypertrophy with repolarization abnormality Nonspecific T wave abnormality Prolonged QT Abnormal ECG Confirmed by Quintella Reichert [phone]) on 03/21/2021 3:29:56 PM   Radiology CT Head Wo Contrast  Result Date: 03/27/2021 CLINICAL DATA:  Fall with head and neck pain. EXAM: CT HEAD WITHOUT CONTRAST CT CERVICAL SPINE WITHOUT CONTRAST TECHNIQUE: Multidetector CT imaging  of the head and cervical spine was performed following the standard protocol without intravenous contrast. Multiplanar CT image reconstructions of the cervical spine were also generated. COMPARISON:  CT head dated 12/11/2020. FINDINGS: CT HEAD FINDINGS Brain: No evidence of acute infarction, hemorrhage, hydrocephalus, extra-axial collection or mass lesion/mass effect. There is mild cerebral volume loss with associated ex vacuo dilatation. Periventricular white matter hypoattenuation likely represents chronic small vessel ischemic disease. Vascular: There are vascular calcifications in the carotid siphons. Skull:  Normal. Negative for fracture or focal lesion. Sinuses/Orbits: No acute finding. Other: None. CT CERVICAL SPINE FINDINGS Alignment: Gentle cervical kyphosis is likely positional. Skull base and vertebrae: No acute fracture. No primary bone lesion or focal pathologic process. Soft tissues and spinal canal: No prevertebral fluid or swelling. No visible canal hematoma. Disc levels: Up to moderate multilevel degenerative disc and joint disease. Upper chest: Bilateral pleural effusions with associated atelectasis are partially imaged. Other: None. IMPRESSION: 1. No acute intracranial process. 2. No acute osseous injury in the cervical spine. 3. Bilateral pleural effusions are partially imaged. Electronically Signed   By: Zerita Boers M.D.   On: 03/14/2021 17:34   CT Cervical Spine Wo Contrast  Result Date: 03/08/2021 CLINICAL DATA:  Fall with head and neck pain. EXAM: CT HEAD WITHOUT CONTRAST CT CERVICAL SPINE WITHOUT CONTRAST TECHNIQUE: Multidetector CT imaging of the head and cervical spine was performed following the standard protocol without intravenous contrast. Multiplanar CT image reconstructions of the cervical spine were also generated. COMPARISON:  CT head dated 12/11/2020. FINDINGS: CT HEAD FINDINGS Brain: No evidence of acute infarction, hemorrhage, hydrocephalus, extra-axial collection or mass lesion/mass effect. There is mild cerebral volume loss with associated ex vacuo dilatation. Periventricular white matter hypoattenuation likely represents chronic small vessel ischemic disease. Vascular: There are vascular calcifications in the carotid siphons. Skull: Normal. Negative for fracture or focal lesion. Sinuses/Orbits: No acute finding. Other: None. CT CERVICAL SPINE FINDINGS Alignment: Gentle cervical kyphosis is likely positional. Skull base and vertebrae: No acute fracture. No primary bone lesion or focal pathologic process. Soft tissues and spinal canal: No prevertebral fluid or swelling. No visible  canal hematoma. Disc levels: Up to moderate multilevel degenerative disc and joint disease. Upper chest: Bilateral pleural effusions with associated atelectasis are partially imaged. Other: None. IMPRESSION: 1. No acute intracranial process. 2. No acute osseous injury in the cervical spine. 3. Bilateral pleural effusions are partially imaged. Electronically Signed   By: Zerita Boers M.D.   On: 03/23/2021 17:34   DG Chest Port 1 View  Result Date: 03/07/2021 CLINICAL DATA:  Syncope.  Cough for the past 3 days. EXAM: PORTABLE CHEST 1 VIEW COMPARISON:  None. FINDINGS: Mild increase in dense airspace opacity in the left mid and lower lung zones. Mild patchy airspace opacity in the right lower lung zone without significant change. The interstitial markings remain diffusely prominent. Grossly stable borderline enlarged cardiac silhouette. Possible left pleural effusion and minimal right pleural effusion. Diffuse osteopenia. IMPRESSION: 1. Mildly increased probable pneumonia in the left mid and lower lung zones. 2. Stable mild patchy pneumonia or alveolar edema in the right lower lung zone. 3. Possible small left pleural effusion and minimal right pleural effusion. 4. Stable interstitial lung disease. Electronically Signed   By: Claudie Revering M.D.   On: 03/06/2021 16:51    Procedures Procedures   Medications Ordered in ED Medications  cefTRIAXone (ROCEPHIN) 1 g in sodium chloride 0.9 % 100 mL IVPB (0 g Intravenous Stopped 03/24/2021 1920)  sodium chloride 0.9 %  bolus 250 mL (0 mLs Intravenous Stopped 03/07/2021 1920)    Followed by  0.9 %  sodium chloride infusion ( Intravenous New Bag/Given 03/16/2021 1946)  azithromycin (ZITHROMAX) 500 mg in sodium chloride 0.9 % 250 mL IVPB (0 mg Intravenous Stopped 03/18/2021 2056)    Followed by  azithromycin (ZITHROMAX) 250 mg in dextrose 5 % 125 mL IVPB (has no administration in time range)  amiodarone (PACERONE) tablet 200 mg (has no administration in time range)  atorvastatin  (LIPITOR) tablet 40 mg (has no administration in time range)  pantoprazole (PROTONIX) EC tablet 40 mg (has no administration in time range)  polyethylene glycol (MIRALAX / GLYCOLAX) packet 17 g (has no administration in time range)  tamsulosin (FLOMAX) capsule 0.4 mg (has no administration in time range)  apixaban (ELIQUIS) tablet 5 mg (has no administration in time range)  ferrous sulfate tablet 325 mg (has no administration in time range)  Budeson-Glycopyrrol-Formoterol 160-9-4.8 MCG/ACT AERO 2 puff (has no administration in time range)  ipratropium-albuterol (DUONEB) 0.5-2.5 (3) MG/3ML nebulizer solution 3 mL (has no administration in time range)  albuterol (PROVENTIL) (2.5 MG/3ML) 0.083% nebulizer solution 2.5 mg (has no administration in time range)  carbamide peroxide (DEBROX) 6.5 % OTIC (EAR) solution 5-10 drop (has no administration in time range)  polyvinyl alcohol (LIQUIFILM TEARS) 1.4 % ophthalmic solution 1 drop (has no administration in time range)  hydrocortisone (ANUSOL-HC) 2.5 % rectal cream 1 application (has no administration in time range)  sodium chloride flush (NS) 0.9 % injection 3 mL (has no administration in time range)  acetaminophen (TYLENOL) tablet 650 mg (has no administration in time range)    Or  acetaminophen (TYLENOL) suppository 650 mg (has no administration in time range)  insulin aspart (novoLOG) injection 0-15 Units (has no administration in time range)  insulin aspart protamine- aspart (NOVOLOG MIX 70/30) injection 5 Units (has no administration in time range)  HYDROcodone-acetaminophen (NORCO/VICODIN) 5-325 MG per tablet 1 tablet (has no administration in time range)  sodium chloride flush (NS) 0.9 % injection 3 mL (3 mLs Intravenous Given 03/07/2021 1841)  oxyCODONE-acetaminophen (PERCOCET/ROXICET) 5-325 MG per tablet 2 tablet (2 tablets Oral Given 03/11/2021 1852)    ED Course  I have reviewed the triage vital signs and the nursing notes.  Pertinent labs &  imaging results that were available during my care of the patient were reviewed by me and considered in my medical decision making (see chart for details).    MDM Rules/Calculators/A&P                          75 year old male with very complex medical history presents to the emergency department for hematuria and URI symptoms.  Vital signs are stable, patient is not in acute distress.  Exam with well-appearing 75 year old male.  Abdomen is soft and nontender.  Cardiovascular exam is reassuring. Does have focal rales in his left lower lung base.   Presentation concerning for UTI and pneumonia.  Will obtain labs and imaging to further evaluate patient.  Does not appear to be septic at this time, is not breath, tachycardic, hypotensive, or tachypneic.  Does not require any more oxygen than normal.  Did recently complete treatment for UTI, concern for treatment failure.  Presentation less consistent with ACS, PE, pneumothorax, nephrolithiasis.  Chest x-ray shows pneumonia in left lower lobe consistent with auscultation.  Given Rocephin and doxycycline.  Does not appear dehydrated at this time, will hold on fluid resuscitation.  Does not meet SIRS criteria.  Urinalysis consistent with UTI.  Also has AKI on labs.  I discussed findings with patient.  Recommended admission as curb 65 score is 2.  He is amenable to admission.  Admitted to hospitalist service.   Final Clinical Impression(s) / ED Diagnoses Final diagnoses:  Fever    Rx / DC Orders ED Discharge Orders    None       Suzan Nailer, DO 03/31/2021 2315    Quintella Reichert, MD 03/11/21 1136

## 2021-03-11 ENCOUNTER — Observation Stay (HOSPITAL_COMMUNITY): Payer: No Typology Code available for payment source

## 2021-03-11 DIAGNOSIS — J9611 Chronic respiratory failure with hypoxia: Secondary | ICD-10-CM | POA: Diagnosis present

## 2021-03-11 DIAGNOSIS — L8992 Pressure ulcer of unspecified site, stage 2: Secondary | ICD-10-CM

## 2021-03-11 DIAGNOSIS — N1832 Chronic kidney disease, stage 3b: Secondary | ICD-10-CM

## 2021-03-11 DIAGNOSIS — M25551 Pain in right hip: Secondary | ICD-10-CM

## 2021-03-11 DIAGNOSIS — N179 Acute kidney failure, unspecified: Secondary | ICD-10-CM | POA: Diagnosis present

## 2021-03-11 DIAGNOSIS — R627 Adult failure to thrive: Secondary | ICD-10-CM | POA: Diagnosis not present

## 2021-03-11 DIAGNOSIS — I5042 Chronic combined systolic (congestive) and diastolic (congestive) heart failure: Secondary | ICD-10-CM | POA: Diagnosis present

## 2021-03-11 DIAGNOSIS — I50810 Right heart failure, unspecified: Secondary | ICD-10-CM

## 2021-03-11 DIAGNOSIS — Z9981 Dependence on supplemental oxygen: Secondary | ICD-10-CM | POA: Diagnosis not present

## 2021-03-11 DIAGNOSIS — Z66 Do not resuscitate: Secondary | ICD-10-CM | POA: Diagnosis present

## 2021-03-11 DIAGNOSIS — I959 Hypotension, unspecified: Secondary | ICD-10-CM | POA: Diagnosis not present

## 2021-03-11 DIAGNOSIS — J449 Chronic obstructive pulmonary disease, unspecified: Secondary | ICD-10-CM | POA: Diagnosis not present

## 2021-03-11 DIAGNOSIS — R55 Syncope and collapse: Secondary | ICD-10-CM

## 2021-03-11 DIAGNOSIS — E1140 Type 2 diabetes mellitus with diabetic neuropathy, unspecified: Secondary | ICD-10-CM

## 2021-03-11 DIAGNOSIS — G8929 Other chronic pain: Secondary | ICD-10-CM

## 2021-03-11 DIAGNOSIS — I10 Essential (primary) hypertension: Secondary | ICD-10-CM

## 2021-03-11 DIAGNOSIS — E662 Morbid (severe) obesity with alveolar hypoventilation: Secondary | ICD-10-CM | POA: Diagnosis present

## 2021-03-11 DIAGNOSIS — G9340 Encephalopathy, unspecified: Secondary | ICD-10-CM | POA: Diagnosis not present

## 2021-03-11 DIAGNOSIS — Z20822 Contact with and (suspected) exposure to covid-19: Secondary | ICD-10-CM | POA: Diagnosis present

## 2021-03-11 DIAGNOSIS — Z794 Long term (current) use of insulin: Secondary | ICD-10-CM

## 2021-03-11 DIAGNOSIS — R531 Weakness: Secondary | ICD-10-CM | POA: Diagnosis present

## 2021-03-11 DIAGNOSIS — I130 Hypertensive heart and chronic kidney disease with heart failure and stage 1 through stage 4 chronic kidney disease, or unspecified chronic kidney disease: Secondary | ICD-10-CM | POA: Diagnosis present

## 2021-03-11 DIAGNOSIS — I361 Nonrheumatic tricuspid (valve) insufficiency: Secondary | ICD-10-CM | POA: Diagnosis not present

## 2021-03-11 DIAGNOSIS — J189 Pneumonia, unspecified organism: Secondary | ICD-10-CM | POA: Diagnosis present

## 2021-03-11 DIAGNOSIS — N184 Chronic kidney disease, stage 4 (severe): Secondary | ICD-10-CM | POA: Diagnosis not present

## 2021-03-11 DIAGNOSIS — N183 Chronic kidney disease, stage 3 unspecified: Secondary | ICD-10-CM | POA: Diagnosis not present

## 2021-03-11 DIAGNOSIS — Z515 Encounter for palliative care: Secondary | ICD-10-CM | POA: Diagnosis not present

## 2021-03-11 DIAGNOSIS — E7800 Pure hypercholesterolemia, unspecified: Secondary | ICD-10-CM | POA: Diagnosis present

## 2021-03-11 DIAGNOSIS — E872 Acidosis: Secondary | ICD-10-CM | POA: Diagnosis present

## 2021-03-11 DIAGNOSIS — I2510 Atherosclerotic heart disease of native coronary artery without angina pectoris: Secondary | ICD-10-CM | POA: Diagnosis not present

## 2021-03-11 DIAGNOSIS — I4892 Unspecified atrial flutter: Secondary | ICD-10-CM | POA: Diagnosis present

## 2021-03-11 DIAGNOSIS — J440 Chronic obstructive pulmonary disease with acute lower respiratory infection: Secondary | ICD-10-CM | POA: Diagnosis present

## 2021-03-11 DIAGNOSIS — I480 Paroxysmal atrial fibrillation: Secondary | ICD-10-CM

## 2021-03-11 DIAGNOSIS — Z8673 Personal history of transient ischemic attack (TIA), and cerebral infarction without residual deficits: Secondary | ICD-10-CM | POA: Diagnosis not present

## 2021-03-11 DIAGNOSIS — I5022 Chronic systolic (congestive) heart failure: Secondary | ICD-10-CM | POA: Diagnosis not present

## 2021-03-11 DIAGNOSIS — Z6833 Body mass index (BMI) 33.0-33.9, adult: Secondary | ICD-10-CM | POA: Diagnosis not present

## 2021-03-11 DIAGNOSIS — I428 Other cardiomyopathies: Secondary | ICD-10-CM | POA: Diagnosis present

## 2021-03-11 DIAGNOSIS — I5082 Biventricular heart failure: Secondary | ICD-10-CM | POA: Diagnosis not present

## 2021-03-11 DIAGNOSIS — D649 Anemia, unspecified: Secondary | ICD-10-CM | POA: Diagnosis not present

## 2021-03-11 DIAGNOSIS — N390 Urinary tract infection, site not specified: Secondary | ICD-10-CM | POA: Diagnosis not present

## 2021-03-11 DIAGNOSIS — N170 Acute kidney failure with tubular necrosis: Secondary | ICD-10-CM | POA: Diagnosis not present

## 2021-03-11 DIAGNOSIS — Z7189 Other specified counseling: Secondary | ICD-10-CM | POA: Diagnosis not present

## 2021-03-11 DIAGNOSIS — I429 Cardiomyopathy, unspecified: Secondary | ICD-10-CM | POA: Diagnosis not present

## 2021-03-11 LAB — BASIC METABOLIC PANEL
Anion gap: 10 (ref 5–15)
BUN: 76 mg/dL — ABNORMAL HIGH (ref 8–23)
CO2: 27 mmol/L (ref 22–32)
Calcium: 8.2 mg/dL — ABNORMAL LOW (ref 8.9–10.3)
Chloride: 103 mmol/L (ref 98–111)
Creatinine, Ser: 5.19 mg/dL — ABNORMAL HIGH (ref 0.61–1.24)
GFR, Estimated: 11 mL/min — ABNORMAL LOW (ref >60)
Glucose, Bld: 119 mg/dL — ABNORMAL HIGH (ref 70–99)
Potassium: 5.2 mmol/L — ABNORMAL HIGH (ref 3.5–5.1)
Sodium: 140 mmol/L (ref 135–145)

## 2021-03-11 LAB — CBC
HCT: 27.8 % — ABNORMAL LOW (ref 39.0–52.0)
Hemoglobin: 8.3 g/dL — ABNORMAL LOW (ref 13.0–17.0)
MCH: 25.5 pg — ABNORMAL LOW (ref 26.0–34.0)
MCHC: 29.9 g/dL — ABNORMAL LOW (ref 30.0–36.0)
MCV: 85.3 fL (ref 80.0–100.0)
Platelets: 425 10*3/uL — ABNORMAL HIGH (ref 150–400)
RBC: 3.26 MIL/uL — ABNORMAL LOW (ref 4.22–5.81)
RDW: 19.9 % — ABNORMAL HIGH (ref 11.5–15.5)
WBC: 6.1 10*3/uL (ref 4.0–10.5)
nRBC: 0 % (ref 0.0–0.2)

## 2021-03-11 LAB — PROTEIN / CREATININE RATIO, URINE
Creatinine, Urine: 79.38 mg/dL
Protein Creatinine Ratio: 0.42 mg/mg{Cre} — ABNORMAL HIGH (ref 0.00–0.15)
Total Protein, Urine: 33 mg/dL

## 2021-03-11 LAB — RESP PANEL BY RT-PCR (FLU A&B, COVID) ARPGX2
Influenza A by PCR: NEGATIVE
Influenza B by PCR: NEGATIVE
SARS Coronavirus 2 by RT PCR: NEGATIVE

## 2021-03-11 LAB — GLUCOSE, CAPILLARY
Glucose-Capillary: 122 mg/dL — ABNORMAL HIGH (ref 70–99)
Glucose-Capillary: 113 mg/dL — ABNORMAL HIGH (ref 70–99)
Glucose-Capillary: 105 mg/dL — ABNORMAL HIGH (ref 70–99)
Glucose-Capillary: 113 mg/dL — ABNORMAL HIGH (ref 70–99)
Glucose-Capillary: 103 mg/dL — ABNORMAL HIGH (ref 70–99)

## 2021-03-11 LAB — CREATININE, URINE, RANDOM: Creatinine, Urine: 78.02 mg/dL

## 2021-03-11 LAB — URIC ACID: Uric Acid, Serum: 11.5 mg/dL — ABNORMAL HIGH (ref 3.7–8.6)

## 2021-03-11 LAB — BRAIN NATRIURETIC PEPTIDE: B Natriuretic Peptide: 2676.5 pg/mL — ABNORMAL HIGH (ref 0.0–100.0)

## 2021-03-11 LAB — CBG MONITORING, ED: Glucose-Capillary: 104 mg/dL — ABNORMAL HIGH (ref 70–99)

## 2021-03-11 LAB — MAGNESIUM: Magnesium: 2.4 mg/dL (ref 1.7–2.4)

## 2021-03-11 MED ORDER — ALBUMIN HUMAN 25 % IV SOLN
25.0000 g | Freq: Once | INTRAVENOUS | Status: AC
  Administered 2021-03-11: 25 g via INTRAVENOUS
  Filled 2021-03-11: qty 100

## 2021-03-11 MED ORDER — OXYCODONE-ACETAMINOPHEN 5-325 MG PO TABS
1.0000 | ORAL_TABLET | Freq: Four times a day (QID) | ORAL | Status: DC | PRN
  Administered 2021-03-11 – 2021-03-14 (×12): 1 via ORAL
  Filled 2021-03-11 (×13): qty 1

## 2021-03-11 MED ORDER — ALLOPURINOL 100 MG PO TABS
100.0000 mg | ORAL_TABLET | Freq: Every day | ORAL | Status: DC
  Administered 2021-03-11 – 2021-03-16 (×6): 100 mg via ORAL
  Filled 2021-03-11 (×6): qty 1

## 2021-03-11 MED ORDER — DARBEPOETIN ALFA 150 MCG/0.3ML IJ SOSY
150.0000 ug | PREFILLED_SYRINGE | INTRAMUSCULAR | Status: DC
  Administered 2021-03-11: 150 ug via SUBCUTANEOUS
  Filled 2021-03-11: qty 0.3

## 2021-03-11 MED ORDER — FLUTICASONE FUROATE-VILANTEROL 200-25 MCG/INH IN AEPB
1.0000 | INHALATION_SPRAY | Freq: Every day | RESPIRATORY_TRACT | Status: DC
  Administered 2021-03-11 – 2021-03-17 (×7): 1 via RESPIRATORY_TRACT
  Filled 2021-03-11: qty 28

## 2021-03-11 MED ORDER — UMECLIDINIUM BROMIDE 62.5 MCG/INH IN AEPB
1.0000 | INHALATION_SPRAY | Freq: Every day | RESPIRATORY_TRACT | Status: DC
  Administered 2021-03-11 – 2021-03-17 (×7): 1 via RESPIRATORY_TRACT
  Filled 2021-03-11: qty 7

## 2021-03-11 MED ORDER — GABAPENTIN 300 MG PO CAPS
300.0000 mg | ORAL_CAPSULE | Freq: Every day | ORAL | Status: DC
  Administered 2021-03-11 – 2021-03-14 (×4): 300 mg via ORAL
  Filled 2021-03-11 (×5): qty 1

## 2021-03-11 MED ORDER — OXYCODONE HCL 5 MG PO TABS
5.0000 mg | ORAL_TABLET | Freq: Four times a day (QID) | ORAL | Status: DC | PRN
  Administered 2021-03-11 – 2021-03-14 (×11): 5 mg via ORAL
  Filled 2021-03-11 (×12): qty 1

## 2021-03-11 MED ORDER — FLUTICASONE PROPIONATE 50 MCG/ACT NA SUSP
1.0000 | Freq: Every day | NASAL | Status: DC
  Administered 2021-03-11 – 2021-03-14 (×4): 1 via NASAL
  Filled 2021-03-11: qty 16

## 2021-03-11 MED ORDER — LATANOPROST 0.005 % OP SOLN
1.0000 [drp] | Freq: Every day | OPHTHALMIC | Status: DC
  Administered 2021-03-11 – 2021-03-15 (×5): 1 [drp] via OPHTHALMIC
  Filled 2021-03-11: qty 2.5

## 2021-03-11 NOTE — Progress Notes (Signed)
Cross-coverage note:   SBP 80s, MAP low 60s. He has hx of cardiomyopathy, diuretics have been held, and he is getting NS at 75 cc/hr. Plan to give 25 g albumin, try to keep MAP 65 or better.

## 2021-03-11 NOTE — Consult Note (Signed)
[facility] Renal Consultation Note  Requesting MD: Cyndia Skeeters Indication for Consultation: A on CRF  HPI:  Nicholas Caldwell is a 75 y.o. male with past medical history significant for biventricular heart failure- EF 45-50 and severe right heart failure, COPD on chronic O2, OSA, history of CVA, CAD, DM, HTN , HLD and chronic pain as well as advanced CKD- crt at best around 2 in early March of this year.  He averages at least one hospitalization per month-  A lot of times 2-   This is his 6th hospitalization this calendar year-  Usually is for volume overload.  He really never stays out of the hospital long enough to get much OP care established.  He has seen Dr. Marval Regal at [facility] in the past but has been discharged from the clinic due to no shows.  Dr. Loletha Grayer has mentioned to patient that he would not be a dialysis candidate but not sure that information sunk in.  As above crt was 2.11 on 3/10-  Then when admitted 3/26-4/3 had A on CRF -  Best it got was 2.8  On 4/3-  Now presents again with A on CRF crt 4.48 yest and 5.19 today - BP is soft, no UOP recorded.  There had been discussion in the past by our team that patient may not be a candidate for chronic dialysis given his significant cardiomyopathy and inability to achieve stabilization with his current issues.  Most recent issues include orthostasis on home diuretics and also felt to have sepsis related to PNA and UTI-  On ceftriaxone/azithro.  Diuretics have been held so far this admit.  His urine is neg for protein-  Has hematuria and pyuria- renal ultrasound unremarkable.  He does not seem uremic-  Says he saught medical attention due to urinary sxms   Creat  Date/Time Value Ref Range Status  03/24/2016 04:56 PM 0.76 0.70 - 1.25 mg/dL Final  10/01/2015 04:43 PM 0.79 0.70 - 1.25 mg/dL Final  09/12/2015 05:00 PM 0.80 0.70 - 1.25 mg/dL Final  08/27/2015 04:44 PM 0.91 0.70 - 1.25 mg/dL Final   Creatinine, Ser  Date/Time Value Ref Range Status   03/11/2021 07:46 AM 5.19 (H) 0.61 - 1.24 mg/dL Final  03/27/2021 04:19 PM 4.48 (H) 0.61 - 1.24 mg/dL Final  02/15/2021 03:35 PM 2.69 (H) 0.61 - 1.24 mg/dL Final  02/14/2021 03:10 AM 3.09 (H) 0.61 - 1.24 mg/dL Final  02/13/2021 09:00 PM 3.51 (H) 0.61 - 1.24 mg/dL Final  02/03/2021 03:17 AM 2.81 (H) 0.61 - 1.24 mg/dL Final  02/02/2021 09:58 AM 2.88 (H) 0.61 - 1.24 mg/dL Final  02/01/2021 03:49 AM 2.85 (H) 0.61 - 1.24 mg/dL Final  01/31/2021 05:39 AM 3.04 (H) 0.61 - 1.24 mg/dL Final  01/30/2021 09:17 AM 3.03 (H) 0.61 - 1.24 mg/dL Final  01/29/2021 04:06 AM 3.00 (H) 0.61 - 1.24 mg/dL Final  01/28/2021 04:20 AM 3.26 (H) 0.61 - 1.24 mg/dL Final  01/27/2021 03:15 AM 3.36 (H) 0.61 - 1.24 mg/dL Final  01/26/2021 11:19 PM 3.42 (H) 0.61 - 1.24 mg/dL Final  01/26/2021 09:42 PM 3.50 (H) 0.61 - 1.24 mg/dL Final  01/10/2021 12:32 AM 2.11 (H) 0.61 - 1.24 mg/dL Final  01/09/2021 01:19 AM 1.91 (H) 0.61 - 1.24 mg/dL Final  01/08/2021 12:32 AM 1.92 (H) 0.61 - 1.24 mg/dL Final  01/07/2021 12:57 AM 2.11 (H) 0.61 - 1.24 mg/dL Final  01/05/2021 10:13 PM 2.16 (H) 0.61 - 1.24 mg/dL Final  12/19/2020 01:58 AM 2.14 (H) 0.61 - 1.24  mg/dL Final  12/18/2020 01:28 AM 2.20 (H) 0.61 - 1.24 mg/dL Final  12/17/2020 02:22 AM 2.13 (H) 0.61 - 1.24 mg/dL Final  12/16/2020 01:18 AM 1.93 (H) 0.61 - 1.24 mg/dL Final  12/15/2020 01:15 AM 1.90 (H) 0.61 - 1.24 mg/dL Final  12/14/2020 12:21 AM 2.07 (H) 0.61 - 1.24 mg/dL Final  12/13/2020 03:55 PM 2.30 (H) 0.61 - 1.24 mg/dL Final  12/13/2020 07:26 AM 2.49 (H) 0.61 - 1.24 mg/dL Final  12/12/2020 02:42 PM 2.97 (H) 0.61 - 1.24 mg/dL Final  12/12/2020 04:19 AM 3.20 (H) 0.61 - 1.24 mg/dL Final  12/11/2020 08:45 PM 3.56 (H) 0.61 - 1.24 mg/dL Final  12/11/2020 07:30 AM 3.96 (H) 0.61 - 1.24 mg/dL Final  11/25/2020 02:32 AM 1.84 (H) 0.61 - 1.24 mg/dL Final  11/24/2020 02:26 AM 1.77 (H) 0.61 - 1.24 mg/dL Final  11/23/2020 02:07 AM 2.11 (H) 0.61 - 1.24 mg/dL Final  11/22/2020 03:59 AM  2.51 (H) 0.61 - 1.24 mg/dL Final  11/21/2020 08:26 PM 2.60 (H) 0.61 - 1.24 mg/dL Final  11/21/2020 07:44 PM 2.70 (H) 0.61 - 1.24 mg/dL Final  11/01/2020 07:09 AM 2.18 (H) 0.61 - 1.24 mg/dL Final  10/31/2020 03:18 AM 2.17 (H) 0.61 - 1.24 mg/dL Final  10/30/2020 08:34 AM 2.31 (H) 0.61 - 1.24 mg/dL Final  10/30/2020 01:33 AM 2.39 (H) 0.61 - 1.24 mg/dL Final  10/29/2020 10:52 PM 2.34 (H) 0.61 - 1.24 mg/dL Final  10/09/2020 12:19 AM 2.44 (H) 0.61 - 1.24 mg/dL Final  10/08/2020 01:46 AM 2.71 (H) 0.61 - 1.24 mg/dL Final  10/07/2020 12:45 AM 2.79 (H) 0.61 - 1.24 mg/dL Final  10/06/2020 01:01 AM 2.84 (H) 0.61 - 1.24 mg/dL Final  10/05/2020 01:05 AM 3.02 (H) 0.61 - 1.24 mg/dL Final  10/04/2020 12:49 AM 3.10 (H) 0.61 - 1.24 mg/dL Final  10/03/2020 03:55 PM 3.25 (H) 0.61 - 1.24 mg/dL Final  10/03/2020 02:18 AM 2.97 (H) 0.61 - 1.24 mg/dL Final  10/02/2020 12:19 PM 3.06 (H) 0.61 - 1.24 mg/dL Final     PMHx:   Past Medical History:  Diagnosis Date  . Acute cardiogenic pulmonary edema ([place]) 01/26/2021  . Acute on chronic respiratory failure ([place]) 01/14/2019  . Acute systolic CHF (congestive heart failure) ([place]) 09/25/2020  . Atrial flutter ([place])    a. recurrent AFlutter with RVR;  b. Amiodarone Rx started 4/16  . CAD (coronary artery disease)    a. LHC 1/16:  mLAD diffuse disease, pLCx mild disease, dLCx with disease but too small for PCI, RCA ok, EF 25-30%  . Chronic pain   . Chronic systolic CHF (congestive heart failure) ([place])   . COPD (chronic obstructive pulmonary disease) ([place])   . Diabetes mellitus without complication ([place])   . Ex-smoker 11/2017  . Hypercholesteremia   . Hypertension   . NICM (nonischemic cardiomyopathy) ([place])    a.dx 2016. b. 2D echo 06/2016 - Last echo 07/01/16: mod dilated LV, mod LVH, EF 25-30%, mild-mod MR, sev LAE, mild-mod reduced RV systolic function, mild-mod TR, PASP 51mHG.  .Marland KitchenPAF (paroxysmal atrial fibrillation) (H[facility])    On amio - ot a candidate for  flecainide due to cardiomyopathy, not a candidate for Tikosyn due to prolonged QT, and felt to be a poor candidate for ablation given left atrial size.  . Pulmonary hypertension ([place]   . Sleep apnea   . Tobacco abuse     Past Surgical History:  Procedure Laterality Date  . BIOPSY  03/11/2019   Procedure: BIOPSY;  Surgeon: CGerrit Heck  V, DO;  Location: [place];  Service: Gastroenterology;;  . BIOPSY  03/15/2019   Procedure: BIOPSY;  Surgeon: Irving Copas., MD;  Location: [place];  Service: Gastroenterology;;  . CARDIOVERSION N/A 07/30/2018   Procedure: CARDIOVERSION;  Surgeon: Larey Dresser, MD;  Location: [facility] ENDOSCOPY;  Service: Cardiovascular;  Laterality: N/A;  . CARDIOVERSION N/A 12/17/2020   Procedure: CARDIOVERSION;  Surgeon: Larey Dresser, MD;  Location: [facility] ENDOSCOPY;  Service: Cardiovascular;  Laterality: N/A;  . CARDIOVERSION N/A 01/10/2021   Procedure: CARDIOVERSION;  Surgeon: Larey Dresser, MD;  Location: [facility] ENDOSCOPY;  Service: Cardiovascular;  Laterality: N/A;  . COLONOSCOPY N/A 03/11/2019   Procedure: COLONOSCOPY;  Surgeon: Lavena Bullion, DO;  Location: [place];  Service: Gastroenterology;  Laterality: N/A;  . ENTEROSCOPY N/A 03/15/2019   Procedure: ENTEROSCOPY;  Surgeon: Rush Landmark Telford Nab., MD;  Location: [place];  Service: Gastroenterology;  Laterality: N/A;  . ESOPHAGOGASTRODUODENOSCOPY Left 03/11/2019   Procedure: ESOPHAGOGASTRODUODENOSCOPY (EGD);  Surgeon: Lavena Bullion, DO;  Location: [facility] ENDOSCOPY;  Service: Gastroenterology;  Laterality: Left;  . LEFT AND RIGHT HEART CATHETERIZATION WITH CORONARY ANGIOGRAM N/A 11/06/2014   Procedure: LEFT AND RIGHT HEART CATHETERIZATION WITH CORONARY ANGIOGRAM;  Surgeon: Jettie Booze, MD;  Location: [facility] CATH LAB;  Service: Cardiovascular;  Laterality: N/A;  . RIGHT/LEFT HEART CATH AND CORONARY ANGIOGRAPHY N/A 12/06/2018   Procedure: RIGHT/LEFT HEART CATH AND CORONARY ANGIOGRAPHY;   Surgeon: Larey Dresser, MD;  Location: [place] CV LAB;  Service: Cardiovascular;  Laterality: N/A;  . SPLENECTOMY    . SUBMUCOSAL TATTOO INJECTION  03/15/2019   Procedure: SUBMUCOSAL TATTOO INJECTION;  Surgeon: Irving Copas., MD;  Location: [place];  Service: Gastroenterology;;  . TEE WITHOUT CARDIOVERSION N/A 12/17/2020   Procedure: TRANSESOPHAGEAL ECHOCARDIOGRAM (TEE);  Surgeon: Larey Dresser, MD;  Location: [facility] ENDOSCOPY;  Service: Cardiovascular;  Laterality: N/A;    Family Hx:  Family History  Problem Relation Age of Onset  . Heart disease Mother   . Hypertension Mother   . Heart failure Mother   . Heart disease Father   . Hypertension Sister   . Heart attack Neg Hx   . Stroke Neg Hx   . Colon cancer Neg Hx   . Esophageal cancer Neg Hx     Social History:  reports that he quit smoking about 2 years ago. His smoking use included cigarettes. He has a 34.00 pack-year smoking history. He has never used smokeless tobacco. He reports that he does not drink alcohol and does not use drugs.  Allergies:  Allergies  Allergen Reactions  . Isosorb Dinitrate-Hydralazine Shortness Of Breath  . Benazepril Nausea And Vomiting  . Brimonidine Other (See Comments)    Dry eyes  . Metformin Other (See Comments)    Cramps and abdominal pain  . Morphine Rash    Medications: Prior to Admission medications   Medication Sig Start Date End Date Taking? Authorizing Provider  albuterol (VENTOLIN HFA) 108 (90 Base) MCG/ACT inhaler Inhale 2 puffs into the lungs every 6 (six) hours as needed for wheezing or shortness of breath.   Yes [provider]  amiodarone (PACERONE) 200 MG tablet Take 1 tablet (200 mg total) by mouth daily. 02/03/21  Yes Almyra Deforest, PA  apixaban (ELIQUIS) 5 MG TABS tablet Take 1 tablet (5 mg total) by mouth 2 (two) times daily. 02/03/21  Yes Almyra Deforest, PA  ascorbic acid (VITAMIN C) 500 MG tablet Take 500 mg by mouth daily. 07/03/20  Yes [provider]  atorvastatin (LIPITOR) 40 MG tablet Take 1 tablet (40 mg total) by mouth daily. 10/30/20  Yes Larey Dresser, MD  Budeson-Glycopyrrol-Formoterol (BREZTRI AEROSPHERE) 160-9-4.8 MCG/ACT AERO Inhale 2 puffs into the lungs 2 (two) times daily. Patient taking differently: Inhale 2 puffs into the lungs 2 (two) times daily as needed (For shortness of breath). 06/21/20  Yes Tanda Rockers, MD  carbamide peroxide (DEBROX) 6.5 % OTIC solution Place 5-10 drops into both ears 3 (three) times daily as needed (ear wax).   Yes [provider]  Carboxymethylcellulose Sod PF 0.25 % SOLN Place 1 drop into both eyes 2 (two) times daily as needed (dry eyes).   Yes [provider]  Cholecalciferol (VITAMIN D) 50 MCG (2000 UT) CAPS Take 2,000 Units by mouth daily.   Yes [provider]  feeding supplement, GLUCERNA SHAKE, (GLUCERNA SHAKE) LIQD Take 237 mLs by mouth 3 (three) times daily between meals. 11/27/20  Yes Swayze, Ava, DO  ferrous sulfate 325 (65 FE) MG tablet Take 325 mg by mouth every Monday, Wednesday, and Friday at 6 PM.   Yes [provider]  fluticasone (FLONASE) 50 MCG/ACT nasal spray Place 1 spray into both nostrils at bedtime.   Yes [provider]  fluticasone furoate-vilanterol (BREO ELLIPTA) 100-25 MCG/INH AEPB Inhale 1 puff into the lungs daily.   Yes [provider]  gabapentin (NEURONTIN) 300 MG capsule Take 300 mg by mouth daily as needed (pain). 12/03/20  Yes [provider]  hydrocortisone (ANUSOL-HC) 2.5 % rectal cream Place 1 application rectally 2 (two) times daily as needed for hemorrhoids or itching.    Yes [provider]  ibuprofen (ADVIL) 200 MG tablet Take 200 mg by mouth every 6 (six) hours as needed for mild pain.   Yes [provider]  insulin aspart protamine- aspart (NOVOLOG MIX 70/30) (70-30) 100 UNIT/ML injection Inject 10 Units into the skin 2 (two) times daily with a meal.   Yes  [provider]  ipratropium-albuterol (DUONEB) 0.5-2.5 (3) MG/3ML SOLN Take 3 mLs by nebulization 4 (four) times daily as needed (sob/wheezing).   Yes [provider]  latanoprost (XALATAN) 0.005 % ophthalmic solution Place 1 drop into both eyes at bedtime.   Yes [provider]  metolazone (ZAROXOLYN) 2.5 MG tablet Take 1 tablet (2.5 mg total) by mouth once a week. Take 1 tablet once a week on Monday, taken 30 min prior to morning torsemide 02/03/21  Yes Meng, [place], [place]  Nystatin (GERHARDT'S BUTT CREAM) CREA Apply 1 application topically 2 (two) times daily. 01/10/21  Yes Nita Sells, MD  oxyCODONE-acetaminophen (PERCOCET) 10-325 MG tablet Take 1 tablet by mouth every 6 (six) hours as needed for pain.   Yes [provider]  pantoprazole (PROTONIX) 40 MG tablet Take 40 mg by mouth 2 (two) times daily. 07/03/20  Yes [provider]  polyethylene glycol powder (GLYCOLAX/MIRALAX) 17 GM/SCOOP powder Take 17 g by mouth daily as needed for constipation. 07/03/20  Yes [provider]  potassium chloride SA (KLOR-CON) 20 MEQ tablet Take 1 tablet (20 mEq total) by mouth 2 (two) times daily. Take extra 62mq (2 tablets) every Monday with metolazone 02/03/21  Yes MAlmyra Deforest PA  rosuvastatin (CRESTOR) 40 MG tablet Take 20 mg by mouth at bedtime. 07/03/20  Yes [provider]  senna-docusate (SENOKOT-S) 8.6-50 MG tablet Take 1 tablet by mouth at bedtime as needed for mild constipation. 07/03/20  Yes [provider]  tamsulosin (FLOMAX) 0.4  MG CAPS capsule Take 1 capsule (0.4 mg total) by mouth daily. 10/09/20  Yes Vann, Jessica U, DO  tiotropium (SPIRIVA) 18 MCG inhalation capsule Place 18 mcg into inhaler and inhale daily.   Yes [provider]  Torsemide 40 MG TABS Take 80 mg by mouth daily. 02/15/21  Yes Larey Dresser, MD  Insulin Syringe 27G X 1/2" 0.5 ML MISC 100 Syringes by Does not apply route 3 (three) times daily. 08/29/19    Hosie Poisson, MD  OXYGEN Inhale 4 L/min into the lungs continuous.    [provider]    I have reviewed the patient's current medications.  Labs:  Results for orders placed or performed during the hospital encounter of 03/30/2021 (from the past 48 hour(s))  Basic metabolic panel     Status: Abnormal   Collection Time: 03/13/2021  4:19 PM  Result Value Ref Range   Sodium 140 135 - 145 mmol/L   Potassium 5.2 (H) 3.5 - 5.1 mmol/L   Chloride 104 98 - 111 mmol/L   CO2 27 22 - 32 mmol/L   Glucose, Bld 53 (L) 70 - 99 mg/dL    Comment: Glucose reference range applies only to samples taken after fasting for at least 8 hours.   BUN 72 (H) 8 - 23 mg/dL   Creatinine, Ser 4.48 (H) 0.61 - 1.24 mg/dL   Calcium 8.3 (L) 8.9 - 10.3 mg/dL   GFR, Estimated 13 (L) >60 mL/min    Comment: (NOTE) Calculated using the CKD-EPI Creatinine Equation (2021)    Anion gap 9 5 - 15    Comment: Performed at [facility] [address], [place], [place] [address]  CBC     Status: Abnormal   Collection Time: 03/30/2021  4:19 PM  Result Value Ref Range   WBC 6.2 4.0 - 10.5 K/uL   RBC 3.30 (L) 4.22 - 5.81 MIL/uL   Hemoglobin 8.4 (L) 13.0 - 17.0 g/dL   HCT 28.3 (L) 39.0 - 52.0 %   MCV 85.8 80.0 - 100.0 fL   MCH 25.5 (L) 26.0 - 34.0 pg   MCHC 29.7 (L) 30.0 - 36.0 g/dL   RDW 19.8 (H) 11.5 - 15.5 %   Platelets 448 (H) 150 - 400 K/uL   nRBC 0.0 0.0 - 0.2 %    Comment: Performed at [facility], [place] [address], [place], [place] [address]  Urinalysis, Routine w reflex microscopic Urine, Clean Catch     Status: Abnormal   Collection Time: 03/04/2021  4:46 PM  Result Value Ref Range   Color, Urine YELLOW YELLOW   APPearance CLOUDY (A) CLEAR   Specific Gravity, Urine 1.008 1.005 - 1.030   pH 6.0 5.0 - 8.0   Glucose, UA NEGATIVE NEGATIVE mg/dL   Hgb urine dipstick MODERATE (A) NEGATIVE   Bilirubin Urine NEGATIVE NEGATIVE   Ketones, ur NEGATIVE NEGATIVE mg/dL   Protein, ur NEGATIVE NEGATIVE mg/dL    Nitrite NEGATIVE NEGATIVE   Leukocytes,Ua LARGE (A) NEGATIVE   RBC / HPF 21-50 0 - 5 RBC/hpf   WBC, UA >50 (H) 0 - 5 WBC/hpf   Bacteria, UA NONE SEEN NONE SEEN   WBC Clumps PRESENT     Comment: Performed at [facility], [address], [place], [place] [address]  POC CBG, ED     Status: None   Collection Time: 03/11/2021  6:44 PM  Result Value Ref Range   Glucose-Capillary 98 70 - 99 mg/dL  Comment: Glucose reference range applies only to samples taken after fasting for at least 8 hours.  Resp Panel by RT-PCR (Flu A&B, Covid) Nasopharyngeal Swab     Status: None   Collection Time: 03/11/21 12:08 AM   Specimen: Nasopharyngeal Swab; Nasopharyngeal(NP) swabs in vial transport medium  Result Value Ref Range   SARS Coronavirus 2 by RT PCR NEGATIVE NEGATIVE    Comment: (NOTE) SARS-CoV-2 target nucleic acids are NOT DETECTED.  The SARS-CoV-2 RNA is generally detectable in upper respiratory specimens during the acute phase of infection. The lowest concentration of SARS-CoV-2 viral copies this assay can detect is 138 copies/mL. A negative result does not preclude SARS-Cov-2 infection and should not be used as the sole basis for treatment or other patient management decisions. A negative result may occur with  improper specimen collection/handling, submission of specimen other than nasopharyngeal swab, presence of viral mutation(s) within the areas targeted by this assay, and inadequate number of viral copies(<138 copies/mL). A negative result must be combined with clinical observations, patient history, and epidemiological information. The expected result is Negative.  Fact Sheet for Patients:  EntrepreneurPulse.com.au  Fact Sheet for Healthcare Providers:  IncredibleEmployment.be  This test is no t yet approved or cleared by the [place] FDA and  has been authorized for detection and/or diagnosis of SARS-CoV-2 by FDA under an Emergency  Use Authorization (EUA). This EUA will remain  in effect (meaning this test can be used) for the duration of the COVID-19 declaration under Section 564(b)(1) of the Act, 21 U.S.C.section 360bbb-3(b)(1), unless the authorization is terminated  or revoked sooner.       Influenza A by PCR NEGATIVE NEGATIVE   Influenza B by PCR NEGATIVE NEGATIVE    Comment: (NOTE) The Xpert Xpress SARS-CoV-2/FLU/RSV plus assay is intended as an aid in the diagnosis of influenza from Nasopharyngeal swab specimens and should not be used as a sole basis for treatment. Nasal washings and aspirates are unacceptable for Xpert Xpress SARS-CoV-2/FLU/RSV testing.  Fact Sheet for Patients: EntrepreneurPulse.com.au  Fact Sheet for Healthcare Providers: IncredibleEmployment.be  This test is not yet approved or cleared by the [place] FDA and has been authorized for detection and/or diagnosis of SARS-CoV-2 by FDA under an Emergency Use Authorization (EUA). This EUA will remain in effect (meaning this test can be used) for the duration of the COVID-19 declaration under Section 564(b)(1) of the Act, 21 U.S.C. section 360bbb-3(b)(1), unless the authorization is terminated or revoked.  Performed at [facility], [place] [address], [place], [place] [address]   CBG monitoring, ED     Status: Abnormal   Collection Time: 03/11/21 12:25 AM  Result Value Ref Range   Glucose-Capillary 104 (H) 70 - 99 mg/dL    Comment: Glucose reference range applies only to samples taken after fasting for at least 8 hours.  Glucose, capillary     Status: Abnormal   Collection Time: 03/11/21  6:55 AM  Result Value Ref Range   Glucose-Capillary 122 (H) 70 - 99 mg/dL    Comment: Glucose reference range applies only to samples taken after fasting for at least 8 hours.   Comment 1 Notify RN    Comment 2 Document in Chart   CBC     Status: Abnormal   Collection Time: 03/11/21  7:46 AM  Result  Value Ref Range   WBC 6.1 4.0 - 10.5 K/uL   RBC 3.26 (L) 4.22 - 5.81 MIL/uL   Hemoglobin 8.3 (L) 13.0 - 17.0 g/dL  HCT 27.8 (L) 39.0 - 52.0 %   MCV 85.3 80.0 - 100.0 fL   MCH 25.5 (L) 26.0 - 34.0 pg   MCHC 29.9 (L) 30.0 - 36.0 g/dL   RDW 19.9 (H) 11.5 - 15.5 %   Platelets 425 (H) 150 - 400 K/uL   nRBC 0.0 0.0 - 0.2 %    Comment: Performed at [facility] [address], [place], [place] [address]  Basic metabolic panel     Status: Abnormal   Collection Time: 03/11/21  7:46 AM  Result Value Ref Range   Sodium 140 135 - 145 mmol/L   Potassium 5.2 (H) 3.5 - 5.1 mmol/L   Chloride 103 98 - 111 mmol/L   CO2 27 22 - 32 mmol/L   Glucose, Bld 119 (H) 70 - 99 mg/dL    Comment: Glucose reference range applies only to samples taken after fasting for at least 8 hours.   BUN 76 (H) 8 - 23 mg/dL   Creatinine, Ser 5.19 (H) 0.61 - 1.24 mg/dL   Calcium 8.2 (L) 8.9 - 10.3 mg/dL   GFR, Estimated 11 (L) >60 mL/min    Comment: (NOTE) Calculated using the CKD-EPI Creatinine Equation (2021)    Anion gap 10 5 - 15    Comment: Performed at [facility] [address], [place], [place] [address]  Brain natriuretic peptide     Status: Abnormal   Collection Time: 03/11/21  7:46 AM  Result Value Ref Range   B Natriuretic Peptide 2,676.5 (H) 0.0 - 100.0 pg/mL    Comment: Performed at [facility] [address], [place], [place] [address]  Magnesium     Status: None   Collection Time: 03/11/21  7:46 AM  Result Value Ref Range   Magnesium 2.4 1.7 - 2.4 mg/dL    Comment: Performed at [facility] [address] [place], [place] [address]  Uric acid     Status: Abnormal   Collection Time: 03/11/21  7:46 AM  Result Value Ref Range   Uric Acid, Serum 11.5 (H) 3.7 - 8.6 mg/dL    Comment: Performed at [facility] [address] [place], [place] [address]  Glucose, capillary     Status: Abnormal   Collection Time: 03/11/21 10:34 AM  Result Value Ref Range    Glucose-Capillary 113 (H) 70 - 99 mg/dL    Comment: Glucose reference range applies only to samples taken after fasting for at least 8 hours.  Creatinine, urine, random     Status: None   Collection Time: 03/11/21 11:55 AM  Result Value Ref Range   Creatinine, Urine 78.02 mg/dL    Comment: Performed at [facility] [address], [place], [place] [address]  Protein / creatinine ratio, urine     Status: Abnormal   Collection Time: 03/11/21 11:55 AM  Result Value Ref Range   Creatinine, Urine 79.38 mg/dL   Total Protein, Urine 33 mg/dL    Comment: NO NORMAL RANGE ESTABLISHED FOR THIS TEST   Protein Creatinine Ratio 0.42 (H) 0.00 - 0.15 mg/mg[Cre]    Comment: Performed at [facility] [address] [place],  [address]  Glucose, capillary     Status: Abnormal   Collection Time: 03/11/21 12:02 PM  Result Value Ref Range   Glucose-Capillary 105 (H) 70 - 99 mg/dL    Comment: Glucose reference range applies only to samples taken after fasting for at least  8 hours.     ROS:  A comprehensive review of systems was negative except for: Genitourinary: positive for decreased stream and dysuria  He says this is better and is not really complaining of anything else at this time  Physical Exam: Vitals:   03/11/21 1036 03/11/21 1246  BP: (!) 100/47 (!) 92/50  Pulse:  70  Resp:  18  Temp: 98.2 F (36.8 C) 98 F (36.7 C)  SpO2: 92% (!) 88%     General: alert, NAD-  Does not seem very mobile HEENT: PERRLA, EOMI, mucous membranes moist Neck: no JVD Heart: RRR Lungs: CBS bilat-  Also poor effort Abdomen: soft, non tender Extremities: really minimal edema if any  Skin: warm and dry Neuro: alert-  Really dont think he understands the severity of his illness  Assessment/Plan: 75 year old BM with severe biventricular heart failure, COPD and CKD-  Failure to thrive of sorts with this being the 6th hospitalization in 2022.   Now with A on CRF 1.Renal- A on CRF.  Has  baseline sever CKD thought due to cardiorenal syndrome.  This time is different in that he is not volume overloaded- in fact diuretics have been put on hold given that he is orthostatic and he is getting ivf at 75 per hour.  He does not appear to be uremic and I would have to say that I agree dialysis would not be the best solution here-  Would not make him any less medically complex and overall perfusion would be difficult to attain if he needed any UF with HD at all 2. Hypertension/volume  - right now does not seem overloaded but this is how he usually is.  I agree with management right now to hold diuretics and to hydrate but need to be careful given his history-  I likely will stop IVF tomorrow  3. ID-  uti and possible PNA-  Started on rocephin and azithro-  He feels better 4. Uric acid of over 11-  Likely is not helping-  Allopurinol added 5. Anemia  - due to frequent hospitalizations and CKD-  Will add ESA-  Avoid iv iron given current infections 6.  Consider palliative care involement -  Something is not working with this gentlemans care at this time -  No quick fixes   Louis Meckel 03/11/2021, 1:07 PM

## 2021-03-11 NOTE — Progress Notes (Signed)
Pt BP soft this evening. MD notified. Orders for albumin and to give PRN pain meds per MD.

## 2021-03-11 NOTE — Consult Note (Signed)
[facility] CM Inpatient Consult   03/11/2021  Sirus Labrie 05-Apr-1946 [id number]   Error: Duplicate note.

## 2021-03-11 NOTE — Plan of Care (Signed)
  Problem: Education: Goal: Knowledge of General Education information will improve Description: Including pain rating scale, medication(s)/side effects and non-pharmacologic comfort measures Outcome: Progressing   Problem: Health Behavior/Discharge Planning: Goal: Ability to manage health-related needs will improve Outcome: Progressing   Problem: Education: Goal: Ability to demonstrate management of disease process will improve Outcome: Progressing Goal: Ability to verbalize understanding of medication therapies will improve Outcome: Progressing

## 2021-03-11 NOTE — ED Notes (Signed)
Report given to [employer]

## 2021-03-11 NOTE — Progress Notes (Signed)
Pt refusing IV fluids. Concerned about receiving too much fluid. Pt educated on why he the fluids were ordered but continues to refuse and wants to talk to [name] before he will accept them. On call MD notified.

## 2021-03-11 NOTE — Consult Note (Signed)
[facility] Inpatient Consult   03/11/2021  Nicholas Caldwell 1946/05/30 [id number]   [facility] [ACO] Patient: Nicholas Caldwell  Patient is currently active with [facility] for chronic disease management services.  Patient has been engaged by a [facility].  Our community based plan of care has focused on disease management and community resource support.   Patient also active with the [employer] for primary care provider.  Plan:  [place] Coordinator of admission.  Inpatient Transition Of Care [TOC] team member made aware that [facility] following.  Will follow for barriers for community follow up.  Of note, [facility] Care Management services does not replace or interfere with any services that are needed or arranged by inpatient [facility] care management team.  For additional questions or referrals please contact:  Natividad Brood, RN BSN [facility] Liaison  [phone] business mobile phone Toll free office [phone]  Fax number: [phone] [email or web address]

## 2021-03-11 NOTE — Progress Notes (Signed)
PROGRESS NOTE  Nicholas Caldwell EXB:[id number] DOB: Mar 19, 1946   PCP: [name], MD  Patient is from: Home  DOA: 03/11/2021 LOS: 0  Chief complaints: Dizziness, dysuria, cough and fever  Brief Narrative / Interim history: 75 year old M with PMH of BiV HF, CKD-3B, COPD/RF on 4 L, OSA not on CPAP, A. fib, CVA, CAD, DM-2, HTN, HLD and chronic pain presenting with dysuria, lightheadedness, fever and productive cough for about 2 days, and admitted for severe sepsis due to pneumonia and UTI, and AKI.  Cultures obtained.  Patient was started on IV ceftriaxone, azithromycin and IV fluid.   Subjective: Seen and examined earlier this morning.  No major events overnight or this morning.  Complains pain about right hip which is chronic for him.  He is asking if his oxycodone could be increased to every 4 hours instead of every 6 hours.  Dysuria improved.  Continues to endorse cough and chest tightness.  He is concerned about IV fluid.   Objective: Vitals:   03/11/21 0122 03/11/21 0453 03/11/21 0937 03/11/21 1036  BP: 122/68 93/62  (!) 100/47  Pulse: 73 71    Resp: 19 20    Temp: 98.7 F (37.1 C) 98.5 F (36.9 C)  98.2 F (36.8 C)  TempSrc: Oral Oral  Oral  SpO2: 99% 93% 91% 92%  Weight: 106.5 kg       Intake/Output Summary (Last 24 hours) at 03/11/2021 1117 Last data filed at 03/11/2021 0900 Gross per 24 hour  Intake 1238.97 ml  Output --  Net 1238.97 ml   Filed Weights   03/11/21 0122  Weight: 106.5 kg    Examination:  GENERAL: No apparent distress.  Nontoxic. HEENT: MMM.  Vision and hearing grossly intact.  NECK: Supple.  No apparent JVD.  RESP: 93% on 4 L.  No IWOB.  Fair aeration bilaterally. CVS:  RRR. Heart sounds normal.  ABD/GI/GU: BS+. Abd soft, NTND.  MSK/EXT:  Moves extremities. No apparent deformity. No edema.  SKIN: no apparent skin lesion or wound NEURO: Awake, alert and oriented appropriately.  No apparent focal neuro deficit. PSYCH: Calm. Normal affect.    Procedures:  None  Microbiology summarized: [id number] and influenza PCR nonreactive.  Assessment & Plan: AKI on CKD 3B/azotemia: This could be hemodynamically mediated from low blood pressure and concurrent use of diuretics.  He is on torsemide and metolazone at home.  No proteinuria on UA.  Uric acid elevated. Recent Labs    01/30/21 0917 01/31/21 0539 02/01/21 0349 02/02/21 0958 02/03/21 0317 02/13/21 2100 02/14/21 0310 02/15/21 1535 03/12/2021 1619 03/11/21 0746  BUN 86* 82* 82* 77* 73* 108* 99* 84* 72* 76*  CREATININE 3.03* 3.04* 2.85* 2.88* 2.81* 3.51* 3.09* 2.69* 4.48* 5.19*  -Check urine creatinine and urea -UPC and renal ultrasound -Continue IV fluid at 75 cc an hour -Start allopurinol 100 mg daily -Hold home diuretics and avoid nephrotoxic meds -Nephrology consulted -Closely monitor intake and output -Recheck in the morning  Mild hyperkalemia: K5.2.  Likely due to renal failure. -Lokelma 10 mg x 1 -Nephrology consulted.  Community-acquired pneumonia-presented with cough, subjective fever.  No leukocytosis.  CXR concerning for LML and LLL infiltrate. -Continue ceftriaxone and azithromycin  Urinary tract infection: Has dysuria and pyuria. -IV ceftriaxone as above -Follow urine culture  Syncopal event-CT head and CT cervical spine without acute finding. -IV fluid as above -Check orthostatic vitals -Continue holding diuretics  Paroxysmal A. Fib s/p DCCV in 12/2020: Rate controlled. -Continue home amiodarone and Eliquis  Chronic COPD/chronic  hypoxic respiratory failure on 4 L of baseline OSA not on CPAP at baseline -Continue home supplemental oxygen -Continue breathing treatments  BiV HF: TTE in 12/2020 with LVEF of 45 to 50%, GH, and severely enlarged RV.  He is on torsemide and metolazone.  Appears euvolemic on exam.  BNP 2600 (chronically elevated). -Continue holding diuretics -Closely monitor fluid and respiratory status while on IV  fluid  Hypotension: Could be due to infection and heart failure.   -IV fluid as above -Hold diuretics  Anemia of renal disease: H&H relatively stable. Recent Labs    01/07/21 0057 01/08/21 0032 01/10/21 0032 01/26/21 2142 01/27/21 0315 01/28/21 0420 01/29/21 0406 02/13/21 2100 03/20/2021 1619 03/11/21 0746  HGB 8.5* 8.7* 8.9* 8.9* 8.2* 8.4* 8.1* 9.1* 8.4* 8.3*  -Continue monitoring  Controlled IDDM-2 with CKD-3B: A1c 6.8% on 01/28/2021. Recent Labs  Lab 03/12/2021 1844 03/11/21 0025 03/11/21 0655 03/11/21 1034  GLUCAP 98 104* 122* 113*  -Continue current insulin regimen   Chronic right hip pain: Patient wants his pain medication every 4 hours although he seems to take this every 6 hours per chart review.  His renal function is also worse which precludes further adjustment. -Continue home oxycodone 10/325 every 6 hours as needed  CAD/history of CVA/HLD - Continue home atorvastatin  GERD - Continue PPI  BPH - Continue tamsulosin  OSA -Try nightly CPAP  Obesity Body mass index is 31.83 kg/m.      Pressure skin injury: POA    Pressure Injury 03/11/21 Coccyx Mid Stage 2 -  Partial thickness loss of dermis presenting as a shallow open injury with a red, pink wound bed without slough. (Active)  03/11/21 0140  Location: Coccyx  Location Orientation: Mid  Staging: Stage 2 -  Partial thickness loss of dermis presenting as a shallow open injury with a red, pink wound bed without slough.  Wound Description (Comments):   Present on Admission: Yes   DVT prophylaxis:   apixaban (ELIQUIS) tablet 5 mg  Code Status: Full code Family Communication: Patient and/or RN.  Attempted to call patient's wife but no answer. Level of care: Telemetry Medical Status is: Observation  The patient will require care spanning > 2 midnights and should be moved to inpatient because: Hemodynamically unstable, Persistent severe electrolyte disturbances, Ongoing diagnostic testing  needed not appropriate for outpatient work up, IV treatments appropriate due to intensity of illness or inability to take PO and Inpatient level of care appropriate due to severity of illness  Dispo: The patient is from: Home              Anticipated d/c is to: Home              Patient currently is not medically stable to d/c.   Difficult to place patient No       Consultants:  Nephrology   Sch Meds:  Scheduled Meds: . allopurinol  100 mg Oral Daily  . amiodarone  200 mg Oral Daily  . apixaban  5 mg Oral BID  . atorvastatin  40 mg Oral Daily  . ferrous sulfate  325 mg Oral Q M,W,F-1800  . fluticasone furoate-vilanterol  1 puff Inhalation Daily   And  . umeclidinium bromide  1 puff Inhalation Daily  . insulin aspart  0-15 Units Subcutaneous TID WC  . insulin aspart protamine- aspart  5 Units Subcutaneous BID WC  . latanoprost  1 drop Both Eyes QHS  . pantoprazole  40 mg Oral BID  . sodium chloride  flush  3 mL Intravenous Q12H  . tamsulosin  0.4 mg Oral Daily   Continuous Infusions: . sodium chloride 75 mL/hr at 03/11/21 0915  . azithromycin    . cefTRIAXone (ROCEPHIN)  IV Stopped (03/15/2021 1920)   PRN Meds:.acetaminophen **OR** acetaminophen, albuterol, carbamide peroxide, hydrocortisone, ipratropium-albuterol, oxyCODONE-acetaminophen **AND** oxyCODONE, polyethylene glycol, polyvinyl alcohol  Antimicrobials: Anti-infectives (From admission, onward)   Start     Dose/Rate Route Frequency Ordered Stop   03/11/21 2000  azithromycin (ZITHROMAX) 250 mg in dextrose 5 % 125 mL IVPB       "Followed by" Linked Group Details   250 mg 125 mL/hr over 60 Minutes Intravenous Every 24 hours 03/27/2021 1828     03/26/2021 1830  azithromycin (ZITHROMAX) 500 mg in sodium chloride 0.9 % 250 mL IVPB       "Followed by" Linked Group Details   500 mg 250 mL/hr over 60 Minutes Intravenous  Once 03/31/2021 1828 03/25/2021 2056   03/08/2021 1700  cefTRIAXone (ROCEPHIN) 1 g in sodium chloride 0.9 % 100  mL IVPB        1 g 200 mL/hr over 30 Minutes Intravenous Every 24 hours 03/06/2021 1659     03/20/2021 1700  doxycycline (VIBRA-TABS) tablet 100 mg  Status:  Discontinued        100 mg Oral Every 12 hours 03/08/2021 1659 04/02/2021 1828       I have personally reviewed the following labs and images: CBC: Recent Labs  Lab 03/21/2021 1619 03/11/21 0746  WBC 6.2 6.1  HGB 8.4* 8.3*  HCT 28.3* 27.8*  MCV 85.8 85.3  PLT 448* 425*   BMP &GFR Recent Labs  Lab 03/31/2021 1619 03/11/21 0746  NA 140 140  K 5.2* 5.2*  CL 104 103  CO2 27 27  GLUCOSE 53* 119*  BUN 72* 76*  CREATININE 4.48* 5.19*  CALCIUM 8.3* 8.2*  MG  --  2.4   Estimated Creatinine Clearance: 16.2 mL/min (A) (by C-G formula based on SCr of 5.19 mg/dL (H)). Liver & Pancreas: No results for input(s): AST, ALT, ALKPHOS, BILITOT, PROT, ALBUMIN in the last 168 hours. No results for input(s): LIPASE, AMYLASE in the last 168 hours. No results for input(s): AMMONIA in the last 168 hours. Diabetic: No results for input(s): HGBA1C in the last 72 hours. Recent Labs  Lab 03/20/2021 1844 03/11/21 0025 03/11/21 0655 03/11/21 1034  GLUCAP 98 104* 122* 113*   Cardiac Enzymes: No results for input(s): CKTOTAL, CKMB, CKMBINDEX, TROPONINI in the last 168 hours. No results for input(s): PROBNP in the last 8760 hours. Coagulation Profile: No results for input(s): INR, PROTIME in the last 168 hours. Thyroid Function Tests: No results for input(s): TSH, T4TOTAL, FREET4, T3FREE, THYROIDAB in the last 72 hours. Lipid Profile: No results for input(s): CHOL, HDL, LDLCALC, TRIG, CHOLHDL, LDLDIRECT in the last 72 hours. Anemia Panel: No results for input(s): VITAMINB12, FOLATE, FERRITIN, TIBC, IRON, RETICCTPCT in the last 72 hours. Urine analysis:    Component Value Date/Time   COLORURINE YELLOW 03/24/2021 1646   APPEARANCEUR CLOUDY (A) 04/02/2021 1646   LABSPEC 1.008 03/29/2021 1646   PHURINE 6.0 03/03/2021 1646   GLUCOSEU NEGATIVE  03/28/2021 1646   HGBUR MODERATE (A) 03/15/2021 1646   BILIRUBINUR NEGATIVE 03/20/2021 [place] 03/31/2021 1646   PROTEINUR NEGATIVE 03/12/2021 1646   UROBILINOGEN 0.2 10/26/2014 2206   NITRITE NEGATIVE 03/18/2021 1646   LEUKOCYTESUR LARGE (A) 03/30/2021 1646   Sepsis Labs: Invalid input(s): PROCALCITONIN, [place]  Microbiology:  Recent Results (from the past 240 hour(s))  Resp Panel by RT-PCR (Flu A&B, Covid) Nasopharyngeal Swab     Status: None   Collection Time: 03/11/21 12:08 AM   Specimen: Nasopharyngeal Swab; Nasopharyngeal(NP) swabs in vial transport medium  Result Value Ref Range Status   SARS Coronavirus 2 by RT PCR NEGATIVE NEGATIVE Final    Comment: (NOTE) SARS-CoV-2 target nucleic acids are NOT DETECTED.  The SARS-CoV-2 RNA is generally detectable in upper respiratory specimens during the acute phase of infection. The lowest concentration of SARS-CoV-2 viral copies this assay can detect is 138 copies/mL. A negative result does not preclude SARS-Cov-2 infection and should not be used as the sole basis for treatment or other patient management decisions. A negative result may occur with  improper specimen collection/handling, submission of specimen other than nasopharyngeal swab, presence of viral mutation(s) within the areas targeted by this assay, and inadequate number of viral copies(<138 copies/mL). A negative result must be combined with clinical observations, patient history, and epidemiological information. The expected result is Negative.  Fact Sheet for Patients:  EntrepreneurPulse.com.au  Fact Sheet for Healthcare Providers:  IncredibleEmployment.be  This test is no t yet approved or cleared by the [place] FDA and  has been authorized for detection and/or diagnosis of SARS-CoV-2 by FDA under an Emergency Use Authorization (EUA). This EUA will remain  in effect (meaning this test can be used)  for the duration of the COVID-19 declaration under Section 564(b)(1) of the Act, 21 U.S.C.section 360bbb-3(b)(1), unless the authorization is terminated  or revoked sooner.       Influenza A by PCR NEGATIVE NEGATIVE Final   Influenza B by PCR NEGATIVE NEGATIVE Final    Comment: (NOTE) The Xpert Xpress SARS-CoV-2/FLU/RSV plus assay is intended as an aid in the diagnosis of influenza from Nasopharyngeal swab specimens and should not be used as a sole basis for treatment. Nasal washings and aspirates are unacceptable for Xpert Xpress SARS-CoV-2/FLU/RSV testing.  Fact Sheet for Patients: EntrepreneurPulse.com.au  Fact Sheet for Healthcare Providers: IncredibleEmployment.be  This test is not yet approved or cleared by the [place] FDA and has been authorized for detection and/or diagnosis of SARS-CoV-2 by FDA under an Emergency Use Authorization (EUA). This EUA will remain in effect (meaning this test can be used) for the duration of the COVID-19 declaration under Section 564(b)(1) of the Act, 21 U.S.C. section 360bbb-3(b)(1), unless the authorization is terminated or revoked.  Performed at [facility], [place] [address], [place], [place] [address]     Radiology Studies: CT Head Wo Contrast  Result Date: 03/16/2021 CLINICAL DATA:  Fall with head and neck pain. EXAM: CT HEAD WITHOUT CONTRAST CT CERVICAL SPINE WITHOUT CONTRAST TECHNIQUE: Multidetector CT imaging of the head and cervical spine was performed following the standard protocol without intravenous contrast. Multiplanar CT image reconstructions of the cervical spine were also generated. COMPARISON:  CT head dated 12/11/2020. FINDINGS: CT HEAD FINDINGS Brain: No evidence of acute infarction, hemorrhage, hydrocephalus, extra-axial collection or mass lesion/mass effect. There is mild cerebral volume loss with associated ex vacuo dilatation. Periventricular white matter hypoattenuation  likely represents chronic small vessel ischemic disease. Vascular: There are vascular calcifications in the carotid siphons. Skull: Normal. Negative for fracture or focal lesion. Sinuses/Orbits: No acute finding. Other: None. CT CERVICAL SPINE FINDINGS Alignment: Gentle cervical kyphosis is likely positional. Skull base and vertebrae: No acute fracture. No primary bone lesion or focal pathologic process. Soft tissues and spinal canal: No prevertebral fluid or swelling.  No visible canal hematoma. Disc levels: Up to moderate multilevel degenerative disc and joint disease. Upper chest: Bilateral pleural effusions with associated atelectasis are partially imaged. Other: None. IMPRESSION: 1. No acute intracranial process. 2. No acute osseous injury in the cervical spine. 3. Bilateral pleural effusions are partially imaged. Electronically Signed   By: Zerita Boers M.D.   On: 03/24/2021 17:34   CT Cervical Spine Wo Contrast  Result Date: 03/04/2021 CLINICAL DATA:  Fall with head and neck pain. EXAM: CT HEAD WITHOUT CONTRAST CT CERVICAL SPINE WITHOUT CONTRAST TECHNIQUE: Multidetector CT imaging of the head and cervical spine was performed following the standard protocol without intravenous contrast. Multiplanar CT image reconstructions of the cervical spine were also generated. COMPARISON:  CT head dated 12/11/2020. FINDINGS: CT HEAD FINDINGS Brain: No evidence of acute infarction, hemorrhage, hydrocephalus, extra-axial collection or mass lesion/mass effect. There is mild cerebral volume loss with associated ex vacuo dilatation. Periventricular white matter hypoattenuation likely represents chronic small vessel ischemic disease. Vascular: There are vascular calcifications in the carotid siphons. Skull: Normal. Negative for fracture or focal lesion. Sinuses/Orbits: No acute finding. Other: None. CT CERVICAL SPINE FINDINGS Alignment: Gentle cervical kyphosis is likely positional. Skull base and vertebrae: No acute  fracture. No primary bone lesion or focal pathologic process. Soft tissues and spinal canal: No prevertebral fluid or swelling. No visible canal hematoma. Disc levels: Up to moderate multilevel degenerative disc and joint disease. Upper chest: Bilateral pleural effusions with associated atelectasis are partially imaged. Other: None. IMPRESSION: 1. No acute intracranial process. 2. No acute osseous injury in the cervical spine. 3. Bilateral pleural effusions are partially imaged. Electronically Signed   By: Zerita Boers M.D.   On: 03/30/2021 17:34   DG Chest Port 1 View  Result Date: 03/22/2021 CLINICAL DATA:  Syncope.  Cough for the past 3 days. EXAM: PORTABLE CHEST 1 VIEW COMPARISON:  None. FINDINGS: Mild increase in dense airspace opacity in the left mid and lower lung zones. Mild patchy airspace opacity in the right lower lung zone without significant change. The interstitial markings remain diffusely prominent. Grossly stable borderline enlarged cardiac silhouette. Possible left pleural effusion and minimal right pleural effusion. Diffuse osteopenia. IMPRESSION: 1. Mildly increased probable pneumonia in the left mid and lower lung zones. 2. Stable mild patchy pneumonia or alveolar edema in the right lower lung zone. 3. Possible small left pleural effusion and minimal right pleural effusion. 4. Stable interstitial lung disease. Electronically Signed   By: Claudie Revering M.D.   On: 03/18/2021 16:51      [place] [address] [place]  If 7PM-7AM, please contact night-coverage [email or web address] 03/11/2021, 11:17 AM

## 2021-03-11 NOTE — Progress Notes (Signed)
Patient has home unit CPAP at beside. Patient states he is able to get machine on and off when able. No assistance needed.

## 2021-03-11 NOTE — Evaluation (Signed)
Physical Therapy Evaluation Patient Details Name: Nicholas Caldwell MRN: [id number] DOB: 18-May-1946 Today's Date: 03/11/2021   History of Present Illness  Nicholas Caldwell is a 75 y.o. male with medical history significant of CHF with right ventricular dysfunction, anemia, CKD 3B, A. fib, CVA, chronic respiratory failure with hypoxia, COPD, CAD, diabetes, hypertension, hyperlipidemia, OSA who presents with multiple complaints including dizziness, dysuria, fevers, cough. workup for syncope; PNA, poss UTI, AKI  Clinical Impression   Pt admitted with above diagnosis. Lives at home with wife, manages short household distances with RW, community access with scooter; Presents to PT with decr activity tolerance, BP drop with sitting and standing, generalized weakness -- all of which effect his independence and safety with mobility and ADLs;  Pt currently with functional limitations due to the deficits listed below (see PT Problem List). Pt will benefit from skilled PT to increase their independence and safety with mobility to allow discharge to the venue listed below.       Follow Up Recommendations Home health PT;Supervision - Intermittent    Equipment Recommendations  None recommended by PT (very well-equipped)    Recommendations for Other Services OT consult (as ordered)     Precautions / Restrictions Precautions Precautions: Fall      Mobility  Bed Mobility Overal bed mobility: Needs Assistance Bed Mobility: Supine to Sit     Supine to sit: Min assist;HOB elevated     General bed mobility comments: min handheld assist to pull to sit; cues to square off hips at EOB; slow moving, but able to scoot at EOB to get to stable position for sitting BP    Transfers Overall transfer level: Needs assistance Equipment used: Rolling walker (2 wheeled) Transfers: Sit to/from Stand Sit to Stand: Min guard         General transfer comment: Tended to pull up on RW; Stood from elevated bed; dependent  on momentum  Ambulation/Gait Ambulation/Gait assistance: Herbalist (Feet):  (two sidestep to pt's R to get up closer to [facility]) Assistive device: Rolling walker (2 wheeled)       General Gait Details: cues to self-monitor for activity tolerance  Stairs            Wheelchair Mobility    Modified Rankin (Stroke Patients Only)       Balance Overall balance assessment: Needs assistance   Sitting balance-Leahy Scale: Fair       Standing balance-Leahy Scale: Poor                               Pertinent Vitals/Pain Pain Assessment: Faces Faces Pain Scale: Hurts even more Pain Location: R anterior hip Pain Descriptors / Indicators: Grimacing;Guarding Pain Intervention(s): Monitored during session;Repositioned    Home Living Family/patient expects to be discharged to:: Private residence Living Arrangements: Spouse/significant other Available Help at Discharge: Family;Available 24 hours/day Type of Home: House Home Access: Stairs to enter Entrance Stairs-Rails: None Entrance Stairs-Number of Steps: 1 Home Layout: One level Home Equipment: Walker - 2 wheels;Wheelchair - manual;Bedside commode;Grab bars - tub/shower;Shower seat;Electric scooter;Walker - 4 wheels Additional Comments: on 4L supplemental O2 typically at home    Prior Function Level of Independence: Needs assistance   Gait / Transfers Assistance Needed: was able to stand and transfer to [facility] or [facility] using RW; short household distances  ADL's / Homemaking Assistance Needed: Spouse assists with LB ADLs        Hand Dominance  Dominant Hand: Right    Extremity/Trunk Assessment   Upper Extremity Assessment Upper Extremity Assessment: Defer to OT evaluation;Generalized weakness    Lower Extremity Assessment Lower Extremity Assessment: Generalized weakness       Communication   Communication: HOH  Cognition Arousal/Alertness: Awake/alert Behavior During Therapy: WFL for  tasks assessed/performed;Anxious Overall Cognitive Status: Within Functional Limits for tasks assessed                                 General Comments: Noted to become more anxious with standing      General Comments General comments (skin integrity, edema, etc.):   03/11/21 0859 03/11/21 0900  Orthostatic Lying   BP- Lying 98/79 105/89  Pulse- Lying 69 (map 89) 72 (Map 96)  Orthostatic Sitting  BP- Sitting (!) 81/51 93/55 (post standing approx 1 min)  Pulse- Sitting 73 (MAp 62) 74 (Map 68)  Orthostatic Standing at 0 minutes  BP- Standing at 0 minutes 93/64  --   Pulse- Standing at 0 minutes 68 (Map 73)  --   Orthostatic Standing at 3 minutes  BP- Standing at 3 minutes  (unable to stand long enough to obtain 3 min BP)  --       Exercises     Assessment/Plan    PT Assessment Patient needs continued PT services  PT Problem List Decreased strength;Decreased range of motion;Decreased activity tolerance;Decreased balance;Decreased mobility;Decreased knowledge of use of DME;Decreased knowledge of precautions;Cardiopulmonary status limiting activity       PT Treatment Interventions DME instruction;Gait training;Stair training;Functional mobility training;Therapeutic activities;Therapeutic exercise;Balance training;Patient/family education    PT Goals (Current goals can be found in the Care Plan section)  Acute Rehab PT Goals Patient Stated Goal: Feel better and be able to get up PT Goal Formulation: With patient Time For Goal Achievement: 03/25/21 Potential to Achieve Goals: Good    Frequency Min 3X/week   Barriers to discharge        Co-evaluation               AM-PAC PT "6 Clicks" Mobility  Outcome Measure Help needed turning from your back to your side while in a flat bed without using bedrails?: None Help needed moving from lying on your back to sitting on the side of a flat bed without using bedrails?: A Little Help needed moving to and  from a bed to a chair (including a wheelchair)?: A Little Help needed standing up from a chair using your arms (e.g., wheelchair or bedside chair)?: A Little Help needed to walk in hospital room?: A Lot Help needed climbing 3-5 steps with a railing? : A Lot 6 Click Score: 17    End of Session Equipment Utilized During Treatment: Oxygen Activity Tolerance: Patient limited by fatigue Patient left: in bed;with call bell/phone within reach;with bed alarm set Nurse Communication: Mobility status (supplemental O2 needs) PT Visit Diagnosis: Unsteadiness on feet (R26.81);Other abnormalities of gait and mobility (R26.89);Pain Pain - Right/Left: Right Pain - part of body: Hip    Time: 1610-9604 PT Time Calculation (min) (ACUTE ONLY): 23 min   Charges:   PT Evaluation $PT Eval Moderate Complexity: 1 Mod PT Treatments $Therapeutic Activity: 8-22 mins        Roney Marion, PT  Acute Rehabilitation Services Pager [phone] Office [phone]   Colletta [place] 03/11/2021, 9:53 AM

## 2021-03-12 DIAGNOSIS — N179 Acute kidney failure, unspecified: Principal | ICD-10-CM

## 2021-03-12 DIAGNOSIS — R627 Adult failure to thrive: Secondary | ICD-10-CM | POA: Diagnosis not present

## 2021-03-12 DIAGNOSIS — Z515 Encounter for palliative care: Secondary | ICD-10-CM

## 2021-03-12 DIAGNOSIS — I5082 Biventricular heart failure: Secondary | ICD-10-CM | POA: Diagnosis not present

## 2021-03-12 DIAGNOSIS — N183 Chronic kidney disease, stage 3 unspecified: Secondary | ICD-10-CM | POA: Diagnosis not present

## 2021-03-12 DIAGNOSIS — E1140 Type 2 diabetes mellitus with diabetic neuropathy, unspecified: Secondary | ICD-10-CM | POA: Diagnosis not present

## 2021-03-12 DIAGNOSIS — I5022 Chronic systolic (congestive) heart failure: Secondary | ICD-10-CM

## 2021-03-12 DIAGNOSIS — J189 Pneumonia, unspecified organism: Secondary | ICD-10-CM | POA: Diagnosis not present

## 2021-03-12 DIAGNOSIS — I50810 Right heart failure, unspecified: Secondary | ICD-10-CM | POA: Diagnosis not present

## 2021-03-12 DIAGNOSIS — J449 Chronic obstructive pulmonary disease, unspecified: Secondary | ICD-10-CM | POA: Diagnosis not present

## 2021-03-12 LAB — CBC
HCT: 27.2 % — ABNORMAL LOW (ref 39.0–52.0)
Hemoglobin: 8 g/dL — ABNORMAL LOW (ref 13.0–17.0)
MCH: 25.3 pg — ABNORMAL LOW (ref 26.0–34.0)
MCHC: 29.4 g/dL — ABNORMAL LOW (ref 30.0–36.0)
MCV: 86.1 fL (ref 80.0–100.0)
Platelets: 405 10*3/uL — ABNORMAL HIGH (ref 150–400)
RBC: 3.16 MIL/uL — ABNORMAL LOW (ref 4.22–5.81)
RDW: 19.9 % — ABNORMAL HIGH (ref 11.5–15.5)
WBC: 5.8 10*3/uL (ref 4.0–10.5)
nRBC: 0 % (ref 0.0–0.2)

## 2021-03-12 LAB — RENAL FUNCTION PANEL
Albumin: 2.6 g/dL — ABNORMAL LOW (ref 3.5–5.0)
Anion gap: 11 (ref 5–15)
BUN: 82 mg/dL — ABNORMAL HIGH (ref 8–23)
CO2: 26 mmol/L (ref 22–32)
Calcium: 8.1 mg/dL — ABNORMAL LOW (ref 8.9–10.3)
Chloride: 103 mmol/L (ref 98–111)
Creatinine, Ser: 5.31 mg/dL — ABNORMAL HIGH (ref 0.61–1.24)
GFR, Estimated: 11 mL/min — ABNORMAL LOW (ref >60)
Glucose, Bld: 100 mg/dL — ABNORMAL HIGH (ref 70–99)
Phosphorus: 7.2 mg/dL — ABNORMAL HIGH (ref 2.5–4.6)
Potassium: 5.7 mmol/L — ABNORMAL HIGH (ref 3.5–5.1)
Sodium: 140 mmol/L (ref 135–145)

## 2021-03-12 LAB — PROCALCITONIN: Procalcitonin: 0.48 ng/mL

## 2021-03-12 LAB — GLUCOSE, CAPILLARY
Glucose-Capillary: 88 mg/dL (ref 70–99)
Glucose-Capillary: 105 mg/dL — ABNORMAL HIGH (ref 70–99)
Glucose-Capillary: 139 mg/dL — ABNORMAL HIGH (ref 70–99)
Glucose-Capillary: 81 mg/dL (ref 70–99)

## 2021-03-12 LAB — UREA NITROGEN, URINE: Urea Nitrogen, Ur: 333 mg/dL

## 2021-03-12 LAB — HEMOGLOBIN AND HEMATOCRIT, BLOOD
HCT: 27.1 % — ABNORMAL LOW (ref 39.0–52.0)
Hemoglobin: 8.1 g/dL — ABNORMAL LOW (ref 13.0–17.0)

## 2021-03-12 LAB — MAGNESIUM: Magnesium: 2.5 mg/dL — ABNORMAL HIGH (ref 1.7–2.4)

## 2021-03-12 LAB — PREPARE RBC (CROSSMATCH)

## 2021-03-12 MED ORDER — MIDODRINE HCL 5 MG PO TABS
5.0000 mg | ORAL_TABLET | Freq: Three times a day (TID) | ORAL | Status: DC
  Administered 2021-03-12 – 2021-03-16 (×11): 5 mg via ORAL
  Filled 2021-03-12 (×12): qty 1

## 2021-03-12 MED ORDER — MIDODRINE HCL 5 MG PO TABS: 5.0000 mg | ORAL_TABLET | Freq: Three times a day (TID) | ORAL | Status: DC

## 2021-03-12 MED ORDER — SODIUM CHLORIDE 0.9% IV SOLUTION: Freq: Once | INTRAVENOUS | Status: AC

## 2021-03-12 MED ORDER — AZITHROMYCIN 250 MG PO TABS
250.0000 mg | ORAL_TABLET | Freq: Every day | ORAL | Status: AC
  Administered 2021-03-12 – 2021-03-14 (×3): 250 mg via ORAL
  Filled 2021-03-12 (×3): qty 1

## 2021-03-12 MED ORDER — SODIUM ZIRCONIUM CYCLOSILICATE 10 G PO PACK
10.0000 g | PACK | Freq: Two times a day (BID) | ORAL | Status: DC
  Administered 2021-03-12 – 2021-03-13 (×4): 10 g via ORAL
  Filled 2021-03-12 (×4): qty 1

## 2021-03-12 NOTE — TOC Initial Note (Signed)
Transition of Care [facility]) - Initial/Assessment Note    Patient Details  Name: Nicholas Caldwell MRN: [id number] Date of Birth: December 14, 1945  Transition of Care [facility] CM/SW Contact:    Zenon Mayo, RN Phone Number: 03/12/2021, 4:55 PM  Clinical Narrative:                 Patient is from home with wife, NCM received call from [facility] rep stating PCP at [facility] is Dr . Marjo Bicker fax [phone]. Patient is 100% connected with the [place].  Goes to [place], CSW is [employer]. He has a Human resources officer at home.  [facility].  NCM offered choice for [facility] services, he would like to stay with [facility].  NCM made referral to [facility] for [place].  He states he can take referral.  Patient is also active with [facility].  Soc will begin 24 to 48 hrs post dc. Will need HHPT orders.    Expected Discharge Plan: [place] Barriers to Discharge: Continued Medical Work up   Patient Goals and CMS Choice Patient states their goals for this hospitalization and ongoing recovery are:: return home CMS Medicare.gov Compare Post Acute Care list provided to:: Patient Choice offered to / list presented to : Patient  Expected Discharge Plan and Services Expected Discharge Plan: [place]   Discharge Planning Services: CM Consult Post Acute Care Choice: [place] arrangements for the past 2 months: Single Family Home                   DME Agency: NA       HH Arranged: PT HH Agency: [place] Date [facility] Agency Contacted: 03/12/21 Time HH Agency Contacted: 1651 Representative spoke with at [place]: Tommi Rumps  Prior Living Arrangements/Services Living arrangements for the past 2 months: [place] Lives with:: Spouse Patient language and need for interpreter reviewed:: Yes Do you feel safe going back to the place where you live?: Yes      Need for Family Participation in Patient Care: Yes (Comment)   Current home  services:  (home oxygen with [employer], power scooter)    Activities of Daily Living      Permission Sought/Granted                  Emotional Assessment Appearance:: Appears stated age Attitude/Demeanor/Rapport: Engaged Affect (typically observed): Appropriate Orientation: : Oriented to Situation,Oriented to  Time,Oriented to Place,Oriented to Self Alcohol / Substance Use: Not Applicable Psych Involvement: No (comment)  Admission diagnosis:  Fever [R50.9] Acute renal failure superimposed on stage 3b chronic kidney disease ([place]) [N17.9, N18.32] Acute renal failure superimposed on stage 3 chronic kidney disease ([place]) [N17.9, N18.30] Patient Active Problem List   Diagnosis Date Noted  . Acute renal failure superimposed on stage 3 chronic kidney disease ([place]) 03/11/2021  . Acute renal failure superimposed on stage 3b chronic kidney disease ([place]) 03/19/2021  . CAP (community acquired pneumonia) 03/14/2021  . Uncontrolled type 2 diabetes mellitus with hyperglycemia, with long-term current use of insulin ([place]) 01/27/2021  . Leg weakness, bilateral 01/27/2021  . Chronic kidney disease, stage 3b ([place]) 01/06/2021  . Severe sepsis ([place]) 12/11/2020  . Acute renal failure superimposed on chronic kidney disease ([place]) 12/11/2020  . Abdominal pain 12/11/2020  . Stage III pressure ulcer of sacral region ([place]) 10/01/2020  . Advance care planning   . Pressure ulcer of left buttock 08/21/2019  .  Cardiac arrest ([place]) 08/20/2019  . Palliative care by specialist   . DNR (do not resuscitate) discussion   . Fatigue   . Hyperkalemia 04/10/2019  . Cerebral embolism with cerebral infarction 03/21/2019  . Gastritis and gastroduodenitis   . NSVT (nonsustained ventricular tachycardia) ([place]) 03/08/2019  . Tobacco abuse counseling 03/08/2019  . Iron deficiency anemia   . Mixed diabetic hyperlipidemia associated with type 2 diabetes mellitus ([place])   . Primary osteoarthritis of right hip   .  Hypomagnesemia   . Steroid-induced hyperglycemia   . Supplemental oxygen dependent   . Acute on chronic anemia   . Diabetic peripheral neuropathy ([place])   . Hypoxia   . Altered mental status   . RVF (right ventricular failure) ([place]) 12/30/2018  . AKI (acute kidney injury) ([place])   . Acute respiratory failure ([place]) 11/22/2018  . Acute on chronic respiratory failure with hypoxia and hypercapnia ([place]) 11/13/2018  . Metabolic encephalopathy 57/50/5183  . Chronic respiratory failure with hypoxia ([place]) 12/28/2017  . Acute metabolic encephalopathy   . Atrial fibrillation with RVR ([place])   . SVT (supraventricular tachycardia) ([place])   . COPD GOLD 0   . Medically noncompliant   . Panlobular emphysema ([place])   . OSA (obstructive sleep apnea)   . Pulmonary hypertension ([place])   . Disorientation   . Pressure injury of skin with suspected deep tissue injury 09/07/2016  . Acute on chronic combined systolic and diastolic CHF (congestive heart failure) ([place]) 09/05/2016  . Skin lesion-left heal 09/05/2016  . Chronic systolic CHF (congestive heart failure) ([place]) 07/16/2016  . COPD (chronic obstructive pulmonary disease) ([place]) 07/16/2016  . Uncontrolled type 2 diabetes mellitus with complication ([place])   . Diabetic polyneuropathy associated with diabetes mellitus due to underlying condition ([place])   . Normocytic anemia 06/29/2016  . Coronary artery disease involving native coronary artery of native heart 01/21/2016  . Nonischemic cardiomyopathy ([place]) 10/09/2015  . PAF (paroxysmal atrial fibrillation) ([place])   . Lactic acidosis 02/19/2015  . Chronic pain 02/19/2015  . Morbid obesity ([place]) 02/13/2015  . SOB (shortness of breath)   . Elevated troponin I level 11/01/2014  . COPD with acute exacerbation ([place]) 11/01/2014  . Essential hypertension 10/31/2014  . Type 2 diabetes mellitus with neuropathy 10/31/2014  . Hyperlipidemia  10/31/2014  . Cigarette smoker 10/31/2014   PCP:  [name],  MD Pharmacy:   [facility] [place], [address] [place] [address] Phone: [phone] Fax: [place], [place] - [address] [place] [address] Phone: [phone] Fax: [phone]  [facility] [address] [place] [address] Phone: [phone] Fax: [phone]     Social Determinants of Health (SDOH) Interventions    Readmission Risk Interventions Readmission Risk Prevention Plan 03/12/2021 01/09/2021 11/27/2020  Transportation Screening Complete Complete -  PCP or Specialist Appt within 3-5 Days - - Complete  HRI or Home Care Consult - - Complete  Social Work Consult for [place] Planning/Counseling - - Complete  Palliative Care Screening - - Not Applicable  Medication Review Press photographer) Complete Complete Complete  Med Review Comments - - -  PCP or Specialist appointment within 3-5 days of discharge (No Data) - -  PCP/Specialist Appt Not Complete comments - - -  HRI or Home Care Consult Complete Complete -  SW Recovery Care/Counseling  Consult Complete Complete -  Palliative Care Screening Not Applicable Not Applicable -  [place] Not Applicable Patient Refused -  Some recent data might be hidden

## 2021-03-12 NOTE — Progress Notes (Signed)
PROGRESS NOTE  Nicholas Caldwell VWU:[id number] DOB: 08/17/1946   PCP: [name], MD  Patient is from: Home  DOA: 03/04/2021 LOS: 1  Chief complaints: Dizziness, dysuria, cough and fever  Brief Narrative / Interim history: 75 year old M with PMH of BiV HF, CKD-3B, COPD/RF on 4 L, OSA not on CPAP, A. fib, CVA, CAD, DM-2, HTN, HLD and chronic pain presenting with dysuria, lightheadedness, fever and productive cough for about 2 days, and admitted for severe sepsis due to pneumonia and UTI, and AKI.  Cultures obtained.  Patient was started on IV ceftriaxone, azithromycin and IV fluid.   The next day, nephrology consulted and did not feel he is a candidate for HD.  Palliative medicine consulted but patient wants everything done.  Creatinine seems to be plateauing.  Subjective: Seen and examined earlier this morning.  He was hypotensive overnight with systolic in the 29F.  He had IV albumin ordered.  No complaints other than his chronic right-sided hip pain.  He rates his pain 8/10.  He is asking if his pain medication can be increased to every 4 hours but understands his renal function would be a barrier for this.  He denies chest pain, shortness of breath, GI or UTI symptoms.  He admits to dizziness that he describes as lightheadedness.   Objective: Vitals:   03/12/21 0830 03/12/21 0900 03/12/21 1111 03/12/21 1148  BP: (!) 99/52  (!) 98/50 (!) 84/43  Pulse: 71  70 70  Resp:   20 20  Temp: 98 F (36.7 C)  98.4 F (36.9 C) 98.6 F (37 C)  TempSrc: Oral  Oral Oral  SpO2: 91%  94% 98%  Weight:  109.9 kg      Intake/Output Summary (Last 24 hours) at 03/12/2021 1444 Last data filed at 03/12/2021 1442 Gross per 24 hour  Intake 3471 ml  Output 650 ml  Net 2821 ml   Filed Weights   03/11/21 0122 03/12/21 0600 03/12/21 0900  Weight: 106.5 kg 109 kg 109.9 kg    Examination:  GENERAL: No apparent distress.  Nontoxic. HEENT: MMM.  Vision and hearing grossly intact.  NECK: Supple.  No  apparent JVD.  RESP: 94% on 5 L.  No IWOB.  Fair aeration bilaterally but limited exam due to body habitus. CVS:  RRR. Heart sounds normal.  ABD/GI/GU: BS+. Abd soft, NTND.  MSK/EXT:  Moves extremities. No apparent deformity. No edema.  SKIN: no apparent skin lesion or wound NEURO: Awake, alert and oriented appropriately.  No apparent focal neuro deficit. PSYCH: Calm. Normal affect.   Procedures:  None  Microbiology summarized: [id number] and influenza PCR nonreactive.  Assessment & Plan: AKI on CKD 3B/azotemia: hemodynamically mediated from low blood pressure and concurrent use of diuretics.  He is on torsemide and metolazone at home.  No proteinuria on UA.  Uric acid elevated.  Renal ultrasound without acute finding.  Renal function seems to be plateauing Recent Labs    01/31/21 0539 02/01/21 0349 02/02/21 0958 02/03/21 0317 02/13/21 2100 02/14/21 0310 02/15/21 1535 03/25/2021 1619 03/11/21 0746 03/12/21 0357  BUN 82* 82* 77* 73* 108* 99* 84* 72* 76* 82*  CREATININE 3.04* 2.85* 2.88* 2.81* 3.51* 3.09* 2.69* 4.48* 5.19* 5.31*  -Nephrology following-discontinued IV fluids today. -Continue allopurinol 100 mg daily -Continue holding home diuretics and avoid nephrotoxic meds -Appreciate input by nephrology-do not feel he is a good candidate for HD. -Appreciate palliative medicine input -Closely monitor intake and output -Recheck in the morning  Mild hyperkalemia: K 5.7.  Likely due to renal failure. -Lokelma per nephrology  Community-acquired pneumonia-presented with cough, subjective fever.  No leukocytosis.  CXR concerning for LML and LLL infiltrate.  -Continue ceftriaxone and azithromycin  Urinary tract infection: Has dysuria and pyuria.  Urine culture with 40,000 colonies of E. coli. -IV ceftriaxone as above -Follow culture sensitivity  Syncopal event/persistent hypotension-CT head and CT cervical spine without acute finding. -Repeat echocardiogram -Check orthostatic  vitals -Continue holding diuretics  -Cardiology consult -PT/OT eval  BiV HF: TTE in 12/2020 with LVEF of 45 to 50%, GH, and severely enlarged RV.  He is on torsemide and metolazone.  Appears euvolemic on exam.  BNP 2600 (chronically elevated). -Continue holding diuretics -Closely monitor fluid and respiratory status while on IV fluid -Repeat echocardiogram  Paroxysmal A. Fib s/p DCCV in 12/2020: Rate controlled. -Continue home amiodarone and Eliquis  Chronic COPD/chronic hypoxic respiratory failure on 4 L of baseline OSA not on CPAP at baseline -Continue home supplemental oxygen -Continue breathing treatments   Anemia of renal disease: H&H relatively stable. Recent Labs    01/08/21 0032 01/10/21 0032 01/26/21 2142 01/27/21 0315 01/28/21 0420 01/29/21 0406 02/13/21 2100 03/24/2021 1619 03/11/21 0746 03/12/21 0357  HGB 8.7* 8.9* 8.9* 8.2* 8.4* 8.1* 9.1* 8.4* 8.3* 8.0*  -Transfuse 1 unit -Monitor H&H  Controlled IDDM-2 with CKD-3B: A1c 6.8% on 01/28/2021. Recent Labs  Lab 03/11/21 1202 03/11/21 1630 03/11/21 2105 03/12/21 0602 03/12/21 1138  GLUCAP 105* 113* 103* 88 105*  -Continue current insulin regimen   Chronic right hip pain: Patient wants his pain medication every 4 hours although he seems to take this every 6 hours per chart review.  His renal function is also worse which precludes further adjustment. -Continue home oxycodone 10/325 every 6 hours as needed  CAD/history of CVA/HLD - Continue home atorvastatin -PT/OT eval  GERD - Continue PPI  BPH - Continue tamsulosin  OSA -Try nightly CPAP  Obesity Body mass index is 32.86 kg/m.      Pressure skin injury: POA    Pressure Injury 03/11/21 Coccyx Mid Stage 2 -  Partial thickness loss of dermis presenting as a shallow open injury with a red, pink wound bed without slough. (Active)  03/11/21 0140  Location: Coccyx  Location Orientation: Mid  Staging: Stage 2 -  Partial thickness loss of  dermis presenting as a shallow open injury with a red, pink wound bed without slough.  Wound Description (Comments):   Present on Admission: Yes   DVT prophylaxis:   apixaban (ELIQUIS) tablet 5 mg  Code Status: Full code Family Communication: Patient and/or RN.  Attempted to call patient's wife again for update but no answer. Level of care: Telemetry Medical Status is: Inpatient  Remains inpatient appropriate because:Hemodynamically unstable, Persistent severe electrolyte disturbances, Ongoing diagnostic testing needed not appropriate for outpatient work up, IV treatments appropriate due to intensity of illness or inability to take PO and Inpatient level of care appropriate due to severity of illness   Dispo: The patient is from: Home              Anticipated d/c is to: To be determined              Patient currently is not medically stable to d/c.   Difficult to place patient No           Consultants:  Nephrology Cardiology Palliative medicine   Sch Meds:  Scheduled Meds: . allopurinol  100 mg Oral Daily  . amiodarone  200 mg Oral  Daily  . apixaban  5 mg Oral BID  . atorvastatin  40 mg Oral Daily  . azithromycin  250 mg Oral QHS  . darbepoetin (ARANESP) injection - NON-DIALYSIS  150 mcg Subcutaneous Q Mon-1800  . ferrous sulfate  325 mg Oral Q M,W,F-1800  . fluticasone  1 spray Each Nare QHS  . fluticasone furoate-vilanterol  1 puff Inhalation Daily   And  . umeclidinium bromide  1 puff Inhalation Daily  . gabapentin  300 mg Oral QHS  . insulin aspart  0-15 Units Subcutaneous TID WC  . insulin aspart protamine- aspart  5 Units Subcutaneous BID WC  . latanoprost  1 drop Both Eyes QHS  . pantoprazole  40 mg Oral BID  . sodium chloride flush  3 mL Intravenous Q12H  . sodium zirconium cyclosilicate  10 g Oral BID  . tamsulosin  0.4 mg Oral Daily   Continuous Infusions: . cefTRIAXone (ROCEPHIN)  IV Stopped (03/11/21 1847)   PRN Meds:.acetaminophen **OR**  acetaminophen, albuterol, carbamide peroxide, hydrocortisone, ipratropium-albuterol, oxyCODONE-acetaminophen **AND** oxyCODONE, polyethylene glycol, polyvinyl alcohol  Antimicrobials: Anti-infectives (From admission, onward)   Start     Dose/Rate Route Frequency Ordered Stop   03/12/21 2200  azithromycin (ZITHROMAX) tablet 250 mg        250 mg Oral Daily at bedtime 03/12/21 0854 03/15/21 2159   03/11/21 2000  azithromycin (ZITHROMAX) 250 mg in dextrose 5 % 125 mL IVPB  Status:  Discontinued       "Followed by" Linked Group Details   250 mg 125 mL/hr over 60 Minutes Intravenous Every 24 hours 03/20/2021 1828 03/12/21 0854   03/18/2021 1830  azithromycin (ZITHROMAX) 500 mg in sodium chloride 0.9 % 250 mL IVPB       "Followed by" Linked Group Details   500 mg 250 mL/hr over 60 Minutes Intravenous  Once 03/16/2021 1828 03/09/2021 2056   03/14/2021 1700  cefTRIAXone (ROCEPHIN) 1 g in sodium chloride 0.9 % 100 mL IVPB        1 g 200 mL/hr over 30 Minutes Intravenous Every 24 hours 03/03/2021 1659 03/15/21 2359   03/06/2021 1700  doxycycline (VIBRA-TABS) tablet 100 mg  Status:  Discontinued        100 mg Oral Every 12 hours 03/08/2021 1659 03/22/2021 1828       I have personally reviewed the following labs and images: CBC: Recent Labs  Lab 03/23/2021 1619 03/11/21 0746 03/12/21 0357  WBC 6.2 6.1 5.8  HGB 8.4* 8.3* 8.0*  HCT 28.3* 27.8* 27.2*  MCV 85.8 85.3 86.1  PLT 448* 425* 405*   BMP &GFR Recent Labs  Lab 03/21/2021 1619 03/11/21 0746 03/12/21 0357  NA 140 140 140  K 5.2* 5.2* 5.7*  CL 104 103 103  CO2 _0 GLUCOSE 53* 119* 100*  BUN 72* 76* 82*  CREATININE 4.48* 5.19* 5.31*  CALCIUM 8.3* 8.2* 8.1*  MG  --  2.4 2.5*  PHOS  --   --  7.2*   Estimated Creatinine Clearance: 16.1 mL/min (A) (by C-G formula based on SCr of 5.31 mg/dL (H)). Liver & Pancreas: Recent Labs  Lab 03/12/21 0357  ALBUMIN 2.6*   No results for input(s): LIPASE, AMYLASE in the last 168 hours. No results for  input(s): AMMONIA in the last 168 hours. Diabetic: No results for input(s): HGBA1C in the last 72 hours. Recent Labs  Lab 03/11/21 1202 03/11/21 1630 03/11/21 2105 03/12/21 0602 03/12/21 1138  GLUCAP 105* 113* 103* 88 105*  Cardiac Enzymes: No results for input(s): CKTOTAL, CKMB, CKMBINDEX, TROPONINI in the last 168 hours. No results for input(s): PROBNP in the last 8760 hours. Coagulation Profile: No results for input(s): INR, PROTIME in the last 168 hours. Thyroid Function Tests: No results for input(s): TSH, T4TOTAL, FREET4, T3FREE, THYROIDAB in the last 72 hours. Lipid Profile: No results for input(s): CHOL, HDL, LDLCALC, TRIG, CHOLHDL, LDLDIRECT in the last 72 hours. Anemia Panel: No results for input(s): VITAMINB12, FOLATE, FERRITIN, TIBC, IRON, RETICCTPCT in the last 72 hours. Urine analysis:    Component Value Date/Time   COLORURINE YELLOW 04/01/2021 1646   APPEARANCEUR CLOUDY (A) 03/04/2021 1646   LABSPEC 1.008 03/09/2021 1646   PHURINE 6.0 03/06/2021 1646   GLUCOSEU NEGATIVE 03/04/2021 1646   HGBUR MODERATE (A) 03/20/2021 1646   BILIRUBINUR NEGATIVE 03/28/2021 [place] 03/06/2021 1646   PROTEINUR NEGATIVE 03/20/2021 1646   UROBILINOGEN 0.2 10/26/2014 2206   NITRITE NEGATIVE 03/21/2021 1646   LEUKOCYTESUR LARGE (A) 03/16/2021 1646   Sepsis Labs: Invalid input(s): PROCALCITONIN, [place]  Microbiology: Recent Results (from the past 240 hour(s))  Urine culture     Status: Abnormal (Preliminary result)   Collection Time: 03/09/2021  7:06 PM   Specimen: Urine, Clean Catch  Result Value Ref Range Status   Specimen Description URINE, CLEAN CATCH  Final   Special Requests NONE  Final   Culture (A)  Final    40,000 COLONIES/mL ESCHERICHIA COLI SUSCEPTIBILITIES TO FOLLOW Performed at [facility], [address], [place], [place] [address]    Report Status PENDING  Incomplete  Resp Panel by RT-PCR (Flu A&B, Covid) Nasopharyngeal Swab      Status: None   Collection Time: 03/11/21 12:08 AM   Specimen: Nasopharyngeal Swab; Nasopharyngeal(NP) swabs in vial transport medium  Result Value Ref Range Status   SARS Coronavirus 2 by RT PCR NEGATIVE NEGATIVE Final    Comment: (NOTE) SARS-CoV-2 target nucleic acids are NOT DETECTED.  The SARS-CoV-2 RNA is generally detectable in upper respiratory specimens during the acute phase of infection. The lowest concentration of SARS-CoV-2 viral copies this assay can detect is 138 copies/mL. A negative result does not preclude SARS-Cov-2 infection and should not be used as the sole basis for treatment or other patient management decisions. A negative result may occur with  improper specimen collection/handling, submission of specimen other than nasopharyngeal swab, presence of viral mutation(s) within the areas targeted by this assay, and inadequate number of viral copies(<138 copies/mL). A negative result must be combined with clinical observations, patient history, and epidemiological information. The expected result is Negative.  Fact Sheet for Patients:  EntrepreneurPulse.com.au  Fact Sheet for Healthcare Providers:  IncredibleEmployment.be  This test is no t yet approved or cleared by the [place] FDA and  has been authorized for detection and/or diagnosis of SARS-CoV-2 by FDA under an Emergency Use Authorization (EUA). This EUA will remain  in effect (meaning this test can be used) for the duration of the COVID-19 declaration under Section 564(b)(1) of the Act, 21 U.S.C.section 360bbb-3(b)(1), unless the authorization is terminated  or revoked sooner.       Influenza A by PCR NEGATIVE NEGATIVE Final   Influenza B by PCR NEGATIVE NEGATIVE Final    Comment: (NOTE) The Xpert Xpress SARS-CoV-2/FLU/RSV plus assay is intended as an aid in the diagnosis of influenza from Nasopharyngeal swab specimens and should not be used as a sole basis  for treatment. Nasal washings and aspirates are unacceptable for Xpert  Xpress SARS-CoV-2/FLU/RSV testing.  Fact Sheet for Patients: EntrepreneurPulse.com.au  Fact Sheet for Healthcare Providers: IncredibleEmployment.be  This test is not yet approved or cleared by the [place] FDA and has been authorized for detection and/or diagnosis of SARS-CoV-2 by FDA under an Emergency Use Authorization (EUA). This EUA will remain in effect (meaning this test can be used) for the duration of the COVID-19 declaration under Section 564(b)(1) of the Act, 21 U.S.C. section 360bbb-3(b)(1), unless the authorization is terminated or revoked.  Performed at [facility], [place] [address], [place], [place] [address]     Radiology Studies: No results found.    Criag Wicklund T. [place]  If 7PM-7AM, please contact night-coverage [email or web address] 03/12/2021, 2:44 PM

## 2021-03-12 NOTE — Progress Notes (Signed)
OT Cancellation Note  Patient Details Name: Nicholas Caldwell MRN: [id number] DOB: Apr 14, 1946   Cancelled Treatment:    Reason Eval/Treat Not Completed: Medical issues which prohibited therapy;Patient at procedure or test/ unavailable. Pt with dyspnea and low 02 sats at rest with minimal activity with PT earlier and now receiving blood. OT will follow up next available time as appropriate  Britt Bottom 03/12/2021, 2:06 PM

## 2021-03-12 NOTE — Progress Notes (Signed)
Subjective:  BP soft overnight-  Attempted albumin bolus with not much success, 500 of UOP recorded which was an improvement-  K went up from 5.2 to 5.7 and kidney function overall a little worse.  He is alert, feels OK   Objective Vital signs in last 24 hours: Vitals:   03/11/21 2257 03/12/21 0041 03/12/21 0359 03/12/21 0600  BP: (!) 95/55 97/60 (!) 95/59   Pulse: 70 69 68   Resp:  17 19   Temp:  98.3 F (36.8 C) 98.6 F (37 C)   TempSrc:  Oral Oral   SpO2: 94% 90% 90%   Weight:    109 kg   Weight change: 2.586 kg  Intake/Output Summary (Last 24 hours) at 03/12/2021 0820 Last data filed at 03/12/2021 9480 Gross per 24 hour  Intake 3139.75 ml  Output 500 ml  Net 2639.75 ml    Assessment/Plan: 75 year old BM with severe biventricular heart failure, COPD and CKD-  Failure to thrive of sorts with this being the 6th hospitalization in 2022.   Now with repeated A on CRF 1.Renal- A on CRF.  Has baseline severe CKD thought due to cardiorenal syndrome.  This time is different in that he is not volume overloaded- in fact diuretics have been put on hold given that he is orthostatic and he is getting ivf at 75 per hour.  He does not appear to be uremic and I would have to say that I agree dialysis would not be the best solution here-  Would not make him any less medically complex and overall perfusion would be difficult to attain if he needed any UF with HD at all.  His low BP the last 24 hours strengthens this argument unfortunately.  I talked to him today about how serious this situation is, reiterated that I dont think his heart is strong enough for dialysis  2. Hypertension/volume  - right now does not seem overloaded but this is how he usually is.  I agreed with management initially to hold diuretics and to hydrate but need to be careful given his history-  I will stop IVF today.  Not sure there are any options to increase BP and increase perfusion.  dont know if it would be worthwhile to get Dr.  [name] involved 3. ID-  uti and possible PNA-  rocephin and azithro-  He feels better 4. Uric acid of over 11-  Likely is not helping-  Allopurinol added 5. Anemia  - due to frequent hospitalizations and CKD-  have added ESA-  Avoid iv iron given current infections 6.  Hyperkalemia-  Will give lokelma 7.  Consider palliative care involement -  Something is not working with this [facility] at this time -  No quick fixes    Louis Meckel    Labs: Basic Metabolic Panel: Recent Labs  Lab 03/05/2021 1619 03/11/21 0746 03/12/21 0357  NA 140 140 140  K 5.2* 5.2* 5.7*  CL 104 103 103  CO2 _0 GLUCOSE 53* 119* 100*  BUN 72* 76* 82*  CREATININE 4.48* 5.19* 5.31*  CALCIUM 8.3* 8.2* 8.1*  PHOS  --   --  7.2*   Liver Function Tests: Recent Labs  Lab 03/12/21 0357  ALBUMIN 2.6*   No results for input(s): LIPASE, AMYLASE in the last 168 hours. No results for input(s): AMMONIA in the last 168 hours. CBC: Recent Labs  Lab 03/26/2021 1619 03/11/21 0746 03/12/21 0357  WBC 6.2 6.1 5.8  HGB 8.4* 8.3* 8.0*  HCT 28.3* 27.8* 27.2*  MCV 85.8 85.3 86.1  PLT 448* 425* 405*   Cardiac Enzymes: No results for input(s): CKTOTAL, CKMB, CKMBINDEX, TROPONINI in the last 168 hours. CBG: Recent Labs  Lab 03/11/21 1034 03/11/21 1202 03/11/21 1630 03/11/21 2105 03/12/21 0602  GLUCAP 113* 105* 113* 103* 88    Iron Studies: No results for input(s): IRON, TIBC, TRANSFERRIN, FERRITIN in the last 72 hours. Studies/Results: CT Head Wo Contrast  Result Date: 03/23/2021 CLINICAL DATA:  Fall with head and neck pain. EXAM: CT HEAD WITHOUT CONTRAST CT CERVICAL SPINE WITHOUT CONTRAST TECHNIQUE: Multidetector CT imaging of the head and cervical spine was performed following the standard protocol without intravenous contrast. Multiplanar CT image reconstructions of the cervical spine were also generated. COMPARISON:  CT head dated 12/11/2020. FINDINGS: CT HEAD FINDINGS Brain: No evidence  of acute infarction, hemorrhage, hydrocephalus, extra-axial collection or mass lesion/mass effect. There is mild cerebral volume loss with associated ex vacuo dilatation. Periventricular white matter hypoattenuation likely represents chronic small vessel ischemic disease. Vascular: There are vascular calcifications in the carotid siphons. Skull: Normal. Negative for fracture or focal lesion. Sinuses/Orbits: No acute finding. Other: None. CT CERVICAL SPINE FINDINGS Alignment: Gentle cervical kyphosis is likely positional. Skull base and vertebrae: No acute fracture. No primary bone lesion or focal pathologic process. Soft tissues and spinal canal: No prevertebral fluid or swelling. No visible canal hematoma. Disc levels: Up to moderate multilevel degenerative disc and joint disease. Upper chest: Bilateral pleural effusions with associated atelectasis are partially imaged. Other: None. IMPRESSION: 1. No acute intracranial process. 2. No acute osseous injury in the cervical spine. 3. Bilateral pleural effusions are partially imaged. Electronically Signed   By: Zerita Boers M.D.   On: 03/16/2021 17:34   CT Cervical Spine Wo Contrast  Result Date: 03/22/2021 CLINICAL DATA:  Fall with head and neck pain. EXAM: CT HEAD WITHOUT CONTRAST CT CERVICAL SPINE WITHOUT CONTRAST TECHNIQUE: Multidetector CT imaging of the head and cervical spine was performed following the standard protocol without intravenous contrast. Multiplanar CT image reconstructions of the cervical spine were also generated. COMPARISON:  CT head dated 12/11/2020. FINDINGS: CT HEAD FINDINGS Brain: No evidence of acute infarction, hemorrhage, hydrocephalus, extra-axial collection or mass lesion/mass effect. There is mild cerebral volume loss with associated ex vacuo dilatation. Periventricular white matter hypoattenuation likely represents chronic small vessel ischemic disease. Vascular: There are vascular calcifications in the carotid siphons. Skull:  Normal. Negative for fracture or focal lesion. Sinuses/Orbits: No acute finding. Other: None. CT CERVICAL SPINE FINDINGS Alignment: Gentle cervical kyphosis is likely positional. Skull base and vertebrae: No acute fracture. No primary bone lesion or focal pathologic process. Soft tissues and spinal canal: No prevertebral fluid or swelling. No visible canal hematoma. Disc levels: Up to moderate multilevel degenerative disc and joint disease. Upper chest: Bilateral pleural effusions with associated atelectasis are partially imaged. Other: None. IMPRESSION: 1. No acute intracranial process. 2. No acute osseous injury in the cervical spine. 3. Bilateral pleural effusions are partially imaged. Electronically Signed   By: Zerita Boers M.D.   On: 03/23/2021 17:34   US RENAL  Result Date: 03/11/2021 CLINICAL DATA:  Acute kidney injury EXAM: RENAL / URINARY TRACT ULTRASOUND COMPLETE COMPARISON:  01/27/2021 FINDINGS: Right Kidney: Renal measurements: 10.1 x 4.7 x 5.7 cm = volume: 139 mL. Echogenicity within normal limits. No mass or hydronephrosis visualized. Left Kidney: Renal measurements: 11.4 x 5.5 x 6.0 cm = volume: 198 mL. 2.2 cm cyst in  the midpole. Normal echotexture. No hydronephrosis. Bladder: Partially distended, grossly unremarkable. Other: None. IMPRESSION: No acute findings.  No hydronephrosis. Electronically Signed   By: Rolm Baptise M.D.   On: 03/11/2021 11:53   DG Chest Port 1 View  Result Date: 03/30/2021 CLINICAL DATA:  Syncope.  Cough for the past 3 days. EXAM: PORTABLE CHEST 1 VIEW COMPARISON:  None. FINDINGS: Mild increase in dense airspace opacity in the left mid and lower lung zones. Mild patchy airspace opacity in the right lower lung zone without significant change. The interstitial markings remain diffusely prominent. Grossly stable borderline enlarged cardiac silhouette. Possible left pleural effusion and minimal right pleural effusion. Diffuse osteopenia. IMPRESSION: 1. Mildly increased  probable pneumonia in the left mid and lower lung zones. 2. Stable mild patchy pneumonia or alveolar edema in the right lower lung zone. 3. Possible small left pleural effusion and minimal right pleural effusion. 4. Stable interstitial lung disease. Electronically Signed   By: Claudie Revering M.D.   On: 03/27/2021 16:51   Medications: Infusions: . sodium chloride 75 mL/hr at 03/11/21 0915  . azithromycin 250 mg (03/11/21 2105)  . cefTRIAXone (ROCEPHIN)  IV Stopped (03/11/21 1847)    Scheduled Medications: . allopurinol  100 mg Oral Daily  . amiodarone  200 mg Oral Daily  . apixaban  5 mg Oral BID  . atorvastatin  40 mg Oral Daily  . darbepoetin (ARANESP) injection - NON-DIALYSIS  150 mcg Subcutaneous Q Mon-1800  . ferrous sulfate  325 mg Oral Q M,W,F-1800  . fluticasone  1 spray Each Nare QHS  . fluticasone furoate-vilanterol  1 puff Inhalation Daily   And  . umeclidinium bromide  1 puff Inhalation Daily  . gabapentin  300 mg Oral QHS  . insulin aspart  0-15 Units Subcutaneous TID WC  . insulin aspart protamine- aspart  5 Units Subcutaneous BID WC  . latanoprost  1 drop Both Eyes QHS  . pantoprazole  40 mg Oral BID  . sodium chloride flush  3 mL Intravenous Q12H  . tamsulosin  0.4 mg Oral Daily    have reviewed scheduled and prn medications.  Physical Exam: General: alert, appropriately upset to hear the news regarding his heart and kidneys Heart: RRR Lungs: dec BS at bases-  Big O2 req Abdomen: obese, soft Extremities: min edema    03/12/2021,8:20 AM  LOS: 1 day

## 2021-03-12 NOTE — Progress Notes (Signed)
Physical Therapy Treatment Patient Details Name: Nicholas Caldwell MRN: [id number] DOB: 01-Jan-1946 Today's Date: 03/12/2021    History of Present Illness Nicholas Caldwell is a 75 y.o. male with medical history significant of CHF with right ventricular dysfunction, anemia, CKD 3B, A. fib, CVA, chronic respiratory failure with hypoxia, COPD, CAD, diabetes, hypertension, hyperlipidemia, OSA who presents with multiple complaints including dizziness, dysuria, fevers, cough. workup for syncope; PNA, poss UTI, AKI    PT Comments    Patient not progressing towards PT goals due to dyspnea and low 02 sats at rest and with minimal activity. Requires Min guard assist for standing from EOB and for taking a few steps forwards/backwards to step on scale. Requires mod A to bring BLEs back into bed.  Supine BP 102/51, HR 72 bpm Sitting BP 111/55, HR 75 bpm Standing BP 94/51, HR 70 bpm    Sp02 ranging from 81-85% on 5-6L/min 02 [place] despite rest and cues for pursed lip breathing. RN notified unable to improve 02 sats during session. Encouraged OOB to chair as tolerated later if feeling better. Will continue to follow and progress as able.    Follow Up Recommendations  Home health PT;Supervision for mobility/OOB     Equipment Recommendations  None recommended by PT    Recommendations for Other Services       Precautions / Restrictions Precautions Precautions: Fall;Other (comment) Precaution Comments: watch 02 and BP Restrictions Weight Bearing Restrictions: No    Mobility  Bed Mobility Overal bed mobility: Needs Assistance Bed Mobility: Supine to Sit;Sit to Supine     Supine to sit: Min guard;HOB elevated Sit to supine: Mod assist;HOB elevated   General bed mobility comments: Heavy use of rail to get to EOB, assist with LEs to return to supine.    Transfers Overall transfer level: Needs assistance Equipment used: Rolling walker (2 wheeled);None Transfers: Sit to/from Stand Sit to Stand: Min  guard         General transfer comment: Min guard for safety. Stood from [employer], cues for hand placement and use of momentum. Flexed trunk.  Ambulation/Gait Ambulation/Gait assistance: Min guard Gait Distance (Feet): 4 Feet (2 steps forwards/backwards onto scale) Assistive device: None       General Gait Details: Limited due to Sp02 81% on 6L/min 02 [place] and 3/4 DOE.   Stairs             Wheelchair Mobility    Modified Rankin (Stroke Patients Only)       Balance Overall balance assessment: Needs assistance Sitting-balance support: Feet supported;No upper extremity supported Sitting balance-Leahy Scale: Fair     Standing balance support: During functional activity Standing balance-Leahy Scale: Poor Standing balance comment: Requires UE support in standing.                            Cognition Arousal/Alertness: Awake/alert Behavior During Therapy: WFL for tasks assessed/performed Overall Cognitive Status: Within Functional Limits for tasks assessed                                 General Comments: for basic mobility tasks.      Exercises      General Comments General comments (skin integrity, edema, etc.): Supine BP 102/51, HR 72 bpm, Sitting BP 111/55, HR 75 bpm, Standing BP 94/51, HR 70 bpm. Sp02 ranging from 81-85% on 5-6L/min 02 [place].  Pertinent Vitals/Pain Pain Assessment: Faces Faces Pain Scale: Hurts little more Pain Location: chronic back pain Pain Descriptors / Indicators: Grimacing;Sore Pain Intervention(s): Monitored during session;Repositioned    Home Living                      Prior Function            PT Goals (current goals can now be found in the care plan section) Progress towards PT goals: Not progressing toward goals - comment (due to low SP02, SOB)    Frequency    Min 3X/week      PT Plan Current plan remains appropriate    Co-evaluation              AM-PAC PT "6 Clicks"  Mobility   Outcome Measure  Help needed turning from your back to your side while in a flat bed without using bedrails?: None Help needed moving from lying on your back to sitting on the side of a flat bed without using bedrails?: A Little Help needed moving to and from a bed to a chair (including a wheelchair)?: A Little Help needed standing up from a chair using your arms (e.g., wheelchair or bedside chair)?: A Little Help needed to walk in hospital room?: A Lot Help needed climbing 3-5 steps with a railing? : A Lot 6 Click Score: 17    End of Session Equipment Utilized During Treatment: Oxygen;Gait belt Activity Tolerance: Treatment limited secondary to medical complications (Comment) (decreased 02) Patient left: in bed;with call bell/phone within reach;with bed alarm set Nurse Communication: Mobility status;Other (comment) (drop in Sp02) PT Visit Diagnosis: Unsteadiness on feet (R26.81);Other abnormalities of gait and mobility (R26.89);Pain Pain - part of body:  (back)     Time: 6004-5997 PT Time Calculation (min) (ACUTE ONLY): 19 min  Charges:  $Therapeutic Activity: 8-22 mins                     Marisa Severin, PT, DPT Acute Rehabilitation Services Pager [phone] Office [place] 03/12/2021, 12:03 PM

## 2021-03-12 NOTE — Progress Notes (Signed)
RT note. Pt. Home CPAP at bedside when ready to use.

## 2021-03-12 NOTE — Consult Note (Signed)
Consultation Note Date: 03/12/2021   Patient Name: Nicholas Caldwell  DOB: 1946-02-26  MRN: [id number]  Age / Sex: 75 y.o., male  PCP: [name], MD Referring Physician: Mercy Riding, MD  Reason for Consultation: Establishing goals of care and Psychosocial/spiritual support  HPI/Patient Profile: 75 y.o. male   admitted on 03/30/2021 with  medical history significant of CHF with right ventricular dysfunction, anemia, CKD 3B, A. fib, CVA, chronic respiratory failure with hypoxia, COPD, CAD, diabetes, hypertension, hyperlipidemia, OSA who presents with multiple complaints including dizziness, dysuria, fevers, cough.             Patient has had dysuria which  was treated for about a month ago with antibiotics for a UTI.  States he had temporary relief from his antibiotics and then his symptoms have returned.   Continued physical and functional decline. This is the 7th hospitalization in past 5 months.    ED Course: Vital signs in the ED initially stable with some lower normal blood pressures in the 65B to 903 systolic.  Lab work-up showed BMP with potassium of 5.2, BUN 72, creatinine 4.48 up from last check at 2.69 up from baseline of closer to 2.  CBC showed hemoglobin 8.4 which is stable for him, platelets of 448 which is higher than normal for him.  Urinalysis showed hemoglobin, leukocytes but no bacteria demonstrated.  Chest x-ray with probable pneumonia left middle and left lower lobe.  Stable patchy pneumonia versus edema in the right lower lobe.  Questionable small bilateral effusions.  CT head and CT C-spine were without acute abnormality.  Patient was started on ceftriaxone in the ED.   Patient faces treatment option decisions, advanced directives and anticipatory care needs.   Clinical Assessment and Goals of Care:   This NP Wadie Lessen reviewed medical records, received report from team, assessed the patient and  then meet at the patient's bedside  to discuss diagnosis, prognosis, GOC, EOL wishes disposition and options.   Concept of Palliative Care was introduced as specialized medical care for people and their families living with serious illness.  If focuses on providing relief from the symptoms and stress of a serious illness.  The goal is to improve quality of life for both the patient and the family.   Patient has been seen multiple time by PMT over the past few years.   Values and goals of care important to patient were attempted to be elicited.  Created space and opportunity for patient  to explore thoughts and feelings regarding current medical situation.  Patient quickly verbalized his upsetment and frustration with the conversation.  He interpreted conversation regarding his current medical  situation and disease processes  as negative and "playing God".    I attempted to  offer explanation regarding the importance of    continued conversation with his family and his medical providers regarding overall plan of care and viable treatment options,  ensuring decisions are within the context of the patients values and GOCs.  He verbalized that he did not want to continue conversation or have any further interaction with PMT.  I verbalized understanding and will sign off at this time    Questions and concerns addressed.  Patient  requested that no further palliative intervention  PMT will continue to support holistically.        No documented HPOA, his wife/Patrica is his next of kin.    SUMMARY OF RECOMMENDATIONS    Code Status/Advance Care Planning:  Full code  Encouraged  patient/family to consider DNR/DNI status understanding evidenced based poor outcomes in similar hospitalized patient, as the cause of arrest is likely associated with advanced chronic illness rather than an easily reversible acute cardio-pulmonary event.   Palliative Prophylaxis:   Bowel Regimen, Delirium  Protocol, Frequent Pain Assessment and Oral Care  Additional Recommendations (Limitations, Scope, Preferences):  Full Scope Treatment  Psycho-social/Spiritual:   Desire for further Chaplaincy support:no   Prognosis:   Unable to determine  Discharge Planning: To Be Determined      Primary Diagnoses: Present on Admission: . Acute renal failure superimposed on stage 3b chronic kidney disease ([place]) . Chronic respiratory failure with hypoxia ([place]) . COPD (chronic obstructive pulmonary disease) ([place]) . Chronic systolic CHF (congestive heart failure) ([place]) . Coronary artery disease involving native coronary artery of native heart . Hyperlipidemia  . Essential hypertension . Iron deficiency anemia . PAF (paroxysmal atrial fibrillation) ([place]) . OSA (obstructive sleep apnea) . RVF (right ventricular failure) ([place]) . Acute renal failure superimposed on stage 3 chronic kidney disease ([place])   I have reviewed the medical record, interviewed the patient and family, and examined the patient. The following aspects are pertinent.  Past Medical History:  Diagnosis Date  . Acute cardiogenic pulmonary edema ([place]) 01/26/2021  . Acute on chronic respiratory failure ([place]) 01/14/2019  . Acute systolic CHF (congestive heart failure) ([place]) 09/25/2020  . Atrial flutter ([place])    a. recurrent AFlutter with RVR;  b. Amiodarone Rx started 4/16  . CAD (coronary artery disease)    a. LHC 1/16:  mLAD diffuse disease, pLCx mild disease, dLCx with disease but too small for PCI, RCA ok, EF 25-30%  . Chronic pain   . Chronic systolic CHF (congestive heart failure) ([place])   . COPD (chronic obstructive pulmonary disease) ([place])   . Diabetes mellitus without complication ([place])   . Ex-smoker 11/2017  . Hypercholesteremia   . Hypertension   . NICM (nonischemic cardiomyopathy) ([place])    a.dx 2016. b. 2D echo 06/2016 - Last echo 07/01/16: mod dilated LV, mod LVH, EF 25-30%, mild-mod MR, sev LAE, mild-mod reduced RV  systolic function, mild-mod TR, PASP 74mHG.  .Marland KitchenPAF (paroxysmal atrial fibrillation) ([facility])    On amio - ot a candidate for flecainide due to cardiomyopathy, not a candidate for Tikosyn due to prolonged QT, and felt to be a poor candidate for ablation given left atrial size.  . Pulmonary hypertension [place]   . Sleep apnea   . Tobacco abuse    Social History   Socioeconomic History  . Marital status: Married    Spouse name: Not on file  . Number of children: Not on file  . Years of education: Not on file  . Highest education level: Not on file  Occupational History  . Not on file  Tobacco Use  . Smoking status: Former Smoker    Packs/day: 1.00    Years: 34.00    Pack years: 34.00    Types: Cigarettes    Quit date: 11/30/2018    Years since quitting: 2.2  . Smokeless tobacco: Never Used  Vaping Use  . Vaping Use: Never used  Substance and Sexual Activity  . Alcohol use: No    Alcohol/week: 0.0 standard drinks  . Drug use: No  . Sexual activity: Not on file  Other Topics Concern  . Not on file  Social History Narrative   ** Merged History Encounter **       Social Determinants of Health   Financial  Resource Strain: Low Risk   . Difficulty of Paying Living Expenses: Not very hard  Food Insecurity: No Food Insecurity  . Worried About Charity fundraiser in the Last Year: Never true  . Ran Out of Food in the Last Year: Never true  Transportation Needs: No Transportation Needs  . Lack of Transportation (Medical): No  . Lack of Transportation (Non-Medical): No  Physical Activity: Not on file  Stress: Not on file  Social Connections: Not on file   Family History  Problem Relation Age of Onset  . Heart disease Mother   . Hypertension Mother   . Heart failure Mother   . Heart disease Father   . Hypertension Sister   . Heart attack Neg Hx   . Stroke Neg Hx   . Colon cancer Neg Hx   . Esophageal cancer Neg Hx    Scheduled Meds: . sodium chloride   Intravenous Once   . allopurinol  100 mg Oral Daily  . amiodarone  200 mg Oral Daily  . apixaban  5 mg Oral BID  . atorvastatin  40 mg Oral Daily  . azithromycin  250 mg Oral QHS  . darbepoetin (ARANESP) injection - NON-DIALYSIS  150 mcg Subcutaneous Q Mon-1800  . ferrous sulfate  325 mg Oral Q M,W,F-1800  . fluticasone  1 spray Each Nare QHS  . fluticasone furoate-vilanterol  1 puff Inhalation Daily   And  . umeclidinium bromide  1 puff Inhalation Daily  . gabapentin  300 mg Oral QHS  . insulin aspart  0-15 Units Subcutaneous TID WC  . insulin aspart protamine- aspart  5 Units Subcutaneous BID WC  . latanoprost  1 drop Both Eyes QHS  . pantoprazole  40 mg Oral BID  . sodium chloride flush  3 mL Intravenous Q12H  . sodium zirconium cyclosilicate  10 g Oral BID  . tamsulosin  0.4 mg Oral Daily   Continuous Infusions: . cefTRIAXone (ROCEPHIN)  IV Stopped (03/11/21 1847)   PRN Meds:.acetaminophen **OR** acetaminophen, albuterol, carbamide peroxide, hydrocortisone, ipratropium-albuterol, oxyCODONE-acetaminophen **AND** oxyCODONE, polyethylene glycol, polyvinyl alcohol Medications Prior to Admission:  Prior to Admission medications   Medication Sig Start Date End Date Taking? Authorizing Provider  albuterol (VENTOLIN HFA) 108 (90 Base) MCG/ACT inhaler Inhale 2 puffs into the lungs every 6 (six) hours as needed for wheezing or shortness of breath.   Yes [provider]  amiodarone (PACERONE) 200 MG tablet Take 1 tablet (200 mg total) by mouth daily. 02/03/21  Yes Almyra Deforest, PA  apixaban (ELIQUIS) 5 MG TABS tablet Take 1 tablet (5 mg total) by mouth 2 (two) times daily. 02/03/21  Yes Almyra Deforest, PA  ascorbic acid (VITAMIN C) 500 MG tablet Take 500 mg by mouth daily. 07/03/20  Yes [provider]  atorvastatin (LIPITOR) 40 MG tablet Take 1 tablet (40 mg total) by mouth daily. 10/30/20  Yes Larey Dresser, MD  Budeson-Glycopyrrol-Formoterol (BREZTRI AEROSPHERE) 160-9-4.8 MCG/ACT AERO Inhale 2  puffs into the lungs 2 (two) times daily. Patient taking differently: Inhale 2 puffs into the lungs 2 (two) times daily as needed (For shortness of breath). 06/21/20  Yes Tanda Rockers, MD  carbamide peroxide (DEBROX) 6.5 % OTIC solution Place 5-10 drops into both ears 3 (three) times daily as needed (ear wax).   Yes [provider]  Carboxymethylcellulose Sod PF 0.25 % SOLN Place 1 drop into both eyes 2 (two) times daily as needed (dry eyes).   Yes [provider]  Cholecalciferol (VITAMIN D) 50 MCG (2000 UT) CAPS Take 2,000 Units by mouth daily.   Yes [provider]  feeding supplement, GLUCERNA SHAKE, (GLUCERNA SHAKE) LIQD Take 237 mLs by mouth 3 (three) times daily between meals. 11/27/20  Yes Swayze, Ava, DO  ferrous sulfate 325 (65 FE) MG tablet Take 325 mg by mouth every Monday, Wednesday, and Friday at 6 PM.   Yes [provider]  fluticasone (FLONASE) 50 MCG/ACT nasal spray Place 1 spray into both nostrils at bedtime.   Yes [provider]  fluticasone furoate-vilanterol (BREO ELLIPTA) 100-25 MCG/INH AEPB Inhale 1 puff into the lungs daily.   Yes [provider]  gabapentin (NEURONTIN) 300 MG capsule Take 300 mg by mouth daily as needed (pain). 12/03/20  Yes [provider]  hydrocortisone (ANUSOL-HC) 2.5 % rectal cream Place 1 application rectally 2 (two) times daily as needed for hemorrhoids or itching.    Yes [provider]  ibuprofen (ADVIL) 200 MG tablet Take 200 mg by mouth every 6 (six) hours as needed for mild pain.   Yes [provider]  insulin aspart protamine- aspart (NOVOLOG MIX 70/30) (70-30) 100 UNIT/ML injection Inject 10 Units into the skin 2 (two) times daily with a meal.   Yes [provider]  ipratropium-albuterol (DUONEB) 0.5-2.5 (3) MG/3ML SOLN Take 3 mLs by nebulization 4 (four) times daily as needed (sob/wheezing).   Yes [provider]  latanoprost (XALATAN) 0.005 %  ophthalmic solution Place 1 drop into both eyes at bedtime.   Yes [provider]  metolazone (ZAROXOLYN) 2.5 MG tablet Take 1 tablet (2.5 mg total) by mouth once a week. Take 1 tablet once a week on Monday, taken 30 min prior to morning torsemide 02/03/21  Yes Meng, [place], [place]  Nystatin (GERHARDT'S BUTT CREAM) CREA Apply 1 application topically 2 (two) times daily. 01/10/21  Yes Nita Sells, MD  oxyCODONE-acetaminophen (PERCOCET) 10-325 MG tablet Take 1 tablet by mouth every 6 (six) hours as needed for pain.   Yes [provider]  pantoprazole (PROTONIX) 40 MG tablet Take 40 mg by mouth 2 (two) times daily. 07/03/20  Yes [provider]  polyethylene glycol powder (GLYCOLAX/MIRALAX) 17 GM/SCOOP powder Take 17 g by mouth daily as needed for constipation. 07/03/20  Yes [provider]  potassium chloride SA (KLOR-CON) 20 MEQ tablet Take 1 tablet (20 mEq total) by mouth 2 (two) times daily. Take extra 41mq (2 tablets) every Monday with metolazone 02/03/21  Yes MAlmyra Deforest PA  rosuvastatin (CRESTOR) 40 MG tablet Take 20 mg by mouth at bedtime. 07/03/20  Yes [provider]  senna-docusate (SENOKOT-S) 8.6-50 MG tablet Take 1 tablet by mouth at bedtime as needed for mild constipation. 07/03/20  Yes [provider]  tamsulosin (FLOMAX) 0.4 MG CAPS capsule Take 1 capsule (0.4 mg total) by mouth daily. 10/09/20  Yes Vann, Jessica U, DO  tiotropium (SPIRIVA) 18 MCG inhalation capsule Place 18 mcg into inhaler and inhale daily.   Yes [provider]  Torsemide 40 MG TABS Take 80 mg by mouth daily. 02/15/21  Yes MLarey Dresser MD  Insulin Syringe 27G X 1/2" 0.5 ML MISC 100 Syringes by Does not apply route 3 (three) times daily. 08/29/19   AHosie Poisson MD  OXYGEN Inhale 4 L/min into the lungs continuous.    [provider]   Allergies  Allergen Reactions  . Isosorb Dinitrate-Hydralazine Shortness Of Breath  . Benazepril Nausea And Vomiting   . Brimonidine Other (  See Comments)    Dry eyes  . Metformin Other (See Comments)    Cramps and abdominal pain  . Morphine Rash   Review of Systems  HENT: Positive for congestion.   Neurological: Positive for weakness.    Physical Exam Constitutional:      Appearance: He is normal weight. He is ill-appearing.  Cardiovascular:     Rate and Rhythm: Normal rate.  Abdominal:     General: There is distension.  Skin:    General: Skin is warm and dry.  Neurological:     Mental Status: He is alert.     Vital Signs: BP (!) 99/52 (BP Location: Right Arm)   Pulse 71   Temp 98 F (36.7 C) (Oral)   Resp 19   Wt 109 kg   SpO2 91%   BMI 32.60 kg/m  Pain Scale: 0-10   Pain Score: 5    SpO2: SpO2: 91 % O2 Device:SpO2: 91 % O2 Flow Rate: .O2 Flow Rate (L/min): 5 L/min  IO: Intake/output summary:   Intake/Output Summary (Last 24 hours) at 03/12/2021 1021 Last data filed at 03/12/2021 0900 Gross per 24 hour  Intake 2899.75 ml  Output 650 ml  Net 2249.75 ml    LBM: Last BM Date: 03/09/21 Baseline Weight: Weight: 106.5 kg Most recent weight: Weight: 109 kg     Palliative Assessment/Data: 30 % at best   Discussed with Dr Cyndia Skeeters via secure chat  Time In: 0930 Time Out: 1030 Time Total: 60 minutes Greater than 50%  of this time was spent counseling and coordinating care related to the above assessment and plan.  Signed by: Wadie Lessen, NP   Please contact Palliative Medicine Team phone at [phone] for questions and concerns.  For individual provider: See Shea Evans

## 2021-03-12 NOTE — Consult Note (Addendum)
Advanced Heart Failure Team Consult Note   Primary Physician: [name], MD PCP-Cardiologist:  Dr [name]  Reason for Consultation: Heart Failure   HPI:    Nicholas Caldwell is seen today for evaluation of heart failure at the request of Dr Cyndia Skeeters.  Nicholas Caldwell is a 75 year old history ofchronic primarily diastolic CHF with severe RV dysfunction, COPD on 3 L O2, DM, HTN, OHS/OSA on CPAP,paroxysmal atrial flutter/fibrillations/p DCCV, hyperlipidemia, CAD, and noncompliance.He has multiple admissions due to HF and respiratory failure.Most recent echo in 11/21 showed EF 50-55% with severely enlarged and severely dysfunctional RV, PASP 68 mmHg, moderate-severe TR.  Admitted 12/2020 with PNA, AKI, and A flutter. Treated with antibiotics and had TEE/cardioverison. TEE with EF 45-50% and severe RV failure.   Admitted 01/2021 with volume overload and A fib. Diuresed with IV lasix and had cardioversion. EP consulted and he was not a candidate for ablation.  Discharged on 01/10/21.   Admitted the end of March with volume overload. Diuresed with IV lasix and transitioned to 80 mg twice a day. [facility] involved and he remained full code. Discharged to home. 02/03/2021.   Seen in the ED 4/13 for weakness. He had been taking laxatives followed by diarrhea. Creatinine p to 3.5. Had hypokalemia. K replaced. Sent home on 02/14/21.   Seen in the HF clinic on 02/15/21 and was instructed to cut back torsemide to 80 mg daily and continue metolazone weekly. He no showed on 02/20/21 and canceled 03/04/21 HF appointments.    Discharged from [facility] due to No Shows.   Presented to [facility] ED with with dizziness and dysuria. Hypotensive. CXR concerning for possible pneumonia. Creatinine 4.5 /BUN 72. Not felt to be overloaded. Started on antibiotics for UTI. Urine CX - E Coli. Diuretics have been on hold. Given albumin and IV fluids. Nephrology consulted- not a candidate for hemodialysis. IV fluids stopped  today. Creatinine up to 5.3 and BUN 82. SBP remains low.   Upset today. Wants to know why he was given lasix if hurts his kidneys.   Review of Systems: [y] = yes, _0  = no   . General: Weight gain _1 ; Weight loss _2 ; Anorexia _3 ; Fatigue [Y ]; Fever _4 ; Chills _5 ; Weakness [Y ]  . Cardiac: Chest pain/pressure _6 ; Resting SOB _7 ; Exertional SOB [ Y]; Orthopnea _8 ; Pedal Edema _9 ; Palpitations _10 ; Syncope _11 ; Presyncope _12 ; Paroxysmal nocturnal dyspnea_13   . Pulmonary: Cough [Y ]; Wheezing_14 ; Hemoptysis_15 ; Sputum _16 ; Snoring _17   . GI: Vomiting_18 ; Dysphagia_19 ; Melena_20 ; Hematochezia _21 ; Heartburn_22 ; Abdominal pain _23 ; Constipation _24 ; Diarrhea _25 ; BRBPR _26   . GU: Hematuria_27 ; Dysuria _28 ; Nocturia_29   . Vascular: Pain in legs with walking [ Y]; Pain in feet with lying flat _30 ; Non-healing sores _31 ; Stroke _32 ; TIA _33 ; Slurred speech _34 ;  . Neuro: Headaches_35 ; Vertigo_36 ; Seizures_37 ; Paresthesias_38 ;Blurred vision _39 ; Diplopia _40 ; Vision changes _41   . Ortho/Skin: Arthritis _42 ; Joint pain [Y ]; Muscle pain _43 ; Joint swelling _44 ; Back Pain [ Y]; Rash _45   . Psych: Depression_46 ; Anxiety[ Y]  . Heme: Bleeding problems _47 ; Clotting disorders _48 ; Anemia [Y ]  . Endocrine: Diabetes [Y ]; Thyroid dysfunction_49   Home Medications Prior to Admission medications   Medication  Sig Start Date End Date Taking? Authorizing Provider  albuterol (VENTOLIN HFA) 108 (90 Base) MCG/ACT inhaler Inhale 2 puffs into the lungs every 6 (six) hours as needed for wheezing or shortness of breath.   Yes [provider]  amiodarone (PACERONE) 200 MG tablet Take 1 tablet (200 mg total) by mouth daily. 02/03/21  Yes Almyra Deforest, PA  apixaban (ELIQUIS) 5 MG TABS tablet Take 1 tablet (5 mg total) by mouth 2 (two) times daily. 02/03/21  Yes Almyra Deforest, PA  ascorbic acid (VITAMIN C) 500 MG tablet Take 500 mg by mouth daily. 07/03/20  Yes [provider]  atorvastatin (LIPITOR) 40 MG  tablet Take 1 tablet (40 mg total) by mouth daily. 10/30/20  Yes Larey Dresser, MD  Budeson-Glycopyrrol-Formoterol (BREZTRI AEROSPHERE) 160-9-4.8 MCG/ACT AERO Inhale 2 puffs into the lungs 2 (two) times daily. Patient taking differently: Inhale 2 puffs into the lungs 2 (two) times daily as needed (For shortness of breath). 06/21/20  Yes Tanda Rockers, MD  carbamide peroxide (DEBROX) 6.5 % OTIC solution Place 5-10 drops into both ears 3 (three) times daily as needed (ear wax).   Yes [provider]  Carboxymethylcellulose Sod PF 0.25 % SOLN Place 1 drop into both eyes 2 (two) times daily as needed (dry eyes).   Yes [provider]  Cholecalciferol (VITAMIN D) 50 MCG (2000 UT) CAPS Take 2,000 Units by mouth daily.   Yes [provider]  feeding supplement, GLUCERNA SHAKE, (GLUCERNA SHAKE) LIQD Take 237 mLs by mouth 3 (three) times daily between meals. 11/27/20  Yes Swayze, Ava, DO  ferrous sulfate 325 (65 FE) MG tablet Take 325 mg by mouth every Monday, Wednesday, and Friday at 6 PM.   Yes [provider]  fluticasone (FLONASE) 50 MCG/ACT nasal spray Place 1 spray into both nostrils at bedtime.   Yes [provider]  fluticasone furoate-vilanterol (BREO ELLIPTA) 100-25 MCG/INH AEPB Inhale 1 puff into the lungs daily.   Yes [provider]  gabapentin (NEURONTIN) 300 MG capsule Take 300 mg by mouth daily as needed (pain). 12/03/20  Yes [provider]  hydrocortisone (ANUSOL-HC) 2.5 % rectal cream Place 1 application rectally 2 (two) times daily as needed for hemorrhoids or itching.    Yes [provider]  ibuprofen (ADVIL) 200 MG tablet Take 200 mg by mouth every 6 (six) hours as needed for mild pain.   Yes [provider]  insulin aspart protamine- aspart (NOVOLOG MIX 70/30) (70-30) 100 UNIT/ML injection Inject 10 Units into the skin 2 (two) times daily with a meal.   Yes [provider]  ipratropium-albuterol  (DUONEB) 0.5-2.5 (3) MG/3ML SOLN Take 3 mLs by nebulization 4 (four) times daily as needed (sob/wheezing).   Yes [provider]  latanoprost (XALATAN) 0.005 % ophthalmic solution Place 1 drop into both eyes at bedtime.   Yes [provider]  metolazone (ZAROXOLYN) 2.5 MG tablet Take 1 tablet (2.5 mg total) by mouth once a week. Take 1 tablet once a week on Monday, taken 30 min prior to morning torsemide 02/03/21  Yes Meng, [place], [place]  Nystatin (GERHARDT'S BUTT CREAM) CREA Apply 1 application topically 2 (two) times daily. 01/10/21  Yes Nita Sells, MD  oxyCODONE-acetaminophen (PERCOCET) 10-325 MG tablet Take 1 tablet by mouth every 6 (six) hours as needed for pain.   Yes [provider]  pantoprazole (PROTONIX) 40 MG tablet Take 40 mg by mouth 2 (two) times daily. 07/03/20  Yes [provider]  polyethylene glycol powder (GLYCOLAX/MIRALAX) 17 GM/SCOOP powder Take 17 g by mouth daily as needed for constipation. 07/03/20  Yes [provider]  potassium chloride SA (KLOR-CON) 20 MEQ tablet Take 1 tablet (20 mEq total) by mouth 2 (two) times daily. Take extra 25mq (2 tablets) every Monday with metolazone 02/03/21  Yes MAlmyra Deforest PA  rosuvastatin (CRESTOR) 40 MG tablet Take 20 mg by mouth at bedtime. 07/03/20  Yes [provider]  senna-docusate (SENOKOT-S) 8.6-50 MG tablet Take 1 tablet by mouth at bedtime as needed for mild constipation. 07/03/20  Yes [provider]  tamsulosin (FLOMAX) 0.4 MG CAPS capsule Take 1 capsule (0.4 mg total) by mouth daily. 10/09/20  Yes Vann, Jessica U, DO  tiotropium (SPIRIVA) 18 MCG inhalation capsule Place 18 mcg into inhaler and inhale daily.   Yes [provider]  Torsemide 40 MG TABS Take 80 mg by mouth daily. 02/15/21  Yes MLarey Dresser MD  Insulin Syringe 27G X 1/2" 0.5 ML MISC 100 Syringes by Does not apply route 3 (three) times daily. 08/29/19   AHosie Poisson MD  OXYGEN Inhale 4 L/min into the  lungs continuous.    [provider]    Past Medical History: Past Medical History:  Diagnosis Date  . Acute cardiogenic pulmonary edema ([place] 01/26/2021  . Acute on chronic respiratory failure ([facility] 01/14/2019  . Acute systolic CHF (congestive heart failure) ([place] 09/25/2020  . Atrial flutter ([place]    a. recurrent AFlutter with RVR;  b. Amiodarone Rx started 4/16  . CAD (coronary artery disease)    a. LHC 1/16:  mLAD diffuse disease, pLCx mild disease, dLCx with disease but too small for PCI, RCA ok, EF 25-30%  . Chronic pain   . Chronic systolic CHF (congestive heart failure) ([place]   . COPD (chronic obstructive pulmonary disease) ([place]   . Diabetes mellitus without complication ([facility]   . Ex-smoker 11/2017  . Hypercholesteremia   . Hypertension   . NICM (nonischemic cardiomyopathy) ([place]    a.dx 2016. b. 2D echo 06/2016 - Last echo 07/01/16: mod dilated LV, mod LVH, EF 25-30%, mild-mod Nicholas, sev LAE, mild-mod reduced RV systolic function, mild-mod TR, PASP 550mG.  . Marland KitchenAF (paroxysmal atrial fibrillation) (HCC)    On amio - ot a candidate for flecainide due to cardiomyopathy, not a candidate for Tikosyn due to prolonged QT, and felt to be a poor candidate for ablation given left atrial size.  . Pulmonary hypertension (HCD[place]  . Sleep apnea   . Tobacco abuse     Past Surgical History: Past Surgical History:  Procedure Laterality Date  . BIOPSY  03/11/2019   Procedure: BIOPSY;  Surgeon: CiLavena BullionDO;  [place] Service: Gastroenterology;;  . BIOPSY  03/15/2019   Procedure: BIOPSY;  Surgeon: MaIrving Copas MD;  [place] Service: Gastroenterology;;  . CARDIOVERSION N/A 07/30/2018   Procedure: CARDIOVERSION;  Surgeon: McLarey DresserMD;  [facility];  Service: Cardiovascular;  Laterality: N/A;  . CARDIOVERSION N/A 12/17/2020   Procedure: CARDIOVERSION;  Surgeon: McLarey DresserMD;  [facility];  Service:  Cardiovascular;  Laterality: N/A;  . CARDIOVERSION N/A 01/10/2021   Procedure: CARDIOVERSION;  Surgeon: McLarey DresserMD;  [facility];  Service: Cardiovascular;  Laterality: N/A;  . COLONOSCOPY N/A 03/11/2019   Procedure: COLONOSCOPY;  Surgeon: CiLavena BullionDO;  [place] [facility] Service: Gastroenterology;  Laterality: N/A;  . ENTEROSCOPY N/A 03/15/2019  Procedure: ENTEROSCOPY;  Surgeon: Rush Landmark Telford Nab., MD;  Location: [place];  Service: Gastroenterology;  Laterality: N/A;  . ESOPHAGOGASTRODUODENOSCOPY Left 03/11/2019   Procedure: ESOPHAGOGASTRODUODENOSCOPY (EGD);  Surgeon: Lavena Bullion, DO;  [facility];  Service: Gastroenterology;  Laterality: Left;  . LEFT AND RIGHT HEART CATHETERIZATION WITH CORONARY ANGIOGRAM N/A 11/06/2014   Procedure: LEFT AND RIGHT HEART CATHETERIZATION WITH CORONARY ANGIOGRAM;  Surgeon: Jettie Booze, MD;  [facility] C[facility] LAB;  Service: Cardiovascular;  Laterality: N/A;  . RIGHT/LEFT HEART CATH AND CORONARY ANGIOGRAPHY N/A 12/06/2018   Procedure: RIGHT/LEFT HEART CATH AND CORONARY ANGIOGRAPHY;  Surgeon: Larey Dresser, MD;  Location: [place] CV LAB;  Service: Cardiovascular;  Laterality: N/A;  . SPLENECTOMY    . SUBMUCOSAL TATTOO INJECTION  03/15/2019   Procedure: SUBMUCOSAL TATTOO INJECTION;  Surgeon: Irving Copas., MD;  Location: [place];  Service: Gastroenterology;;  . TEE WITHOUT CARDIOVERSION N/A 12/17/2020   Procedure: TRANSESOPHAGEAL ECHOCARDIOGRAM (TEE);  Surgeon: Larey Dresser, MD;  Location[facility] ENDOSCOPY;  Service: Cardiovascular;  Laterality: N/A;    Family History: Family History  Problem Relation Age of Onset  . Heart disease Mother   . Hypertension Mother   . Heart failure Mother   . Heart disease Father   . Hypertension Sister   . Heart attack Neg Hx   . Stroke Neg Hx   . Colon cancer Neg Hx   . Esophageal cancer Neg Hx     Social History: Social History    Socioeconomic History  . Marital status: Married    Spouse name: Not on file  . Number of children: Not on file  . Years of education: Not on file  . Highest education level: Not on file  Occupational History  . Not on file  Tobacco Use  . Smoking status: Former Smoker    Packs/day: 1.00    Years: 34.00    Pack years: 34.00    Types: Cigarettes    Quit date: 11/30/2018    Years since quitting: 2.2  . Smokeless tobacco: Never Used  Vaping Use  . Vaping Use: Never used  Substance and Sexual Activity  . Alcohol use: No    Alcohol/week: 0.0 standard drinks  . Drug use: No  . Sexual activity: Not on file  Other Topics Concern  . Not on file  Social History Narrative   ** Merged History Encounter **       Social Determinants of Health   Financial Resource Strain: Low Risk   . Difficulty of Paying Living Expenses: Not very hard  Food Insecurity: No Food Insecurity  . Worried About Charity fundraiser in the Last Year: Never true  . Ran Out of Food in the Last Year: Never true  Transportation Needs: No Transportation Needs  . Lack of Transportation (Medical): No  . Lack of Transportation (Non-Medical): No  Physical Activity: Not on file  Stress: Not on file  Social Connections: Not on file    Allergies:  Allergies  Allergen Reactions  . Isosorb Dinitrate-Hydralazine Shortness Of Breath  . Benazepril Nausea And Vomiting  . Brimonidine Other (See Comments)    Dry eyes  . Metformin Other (See Comments)    Cramps and abdominal pain  . Morphine Rash    Objective:    Vital Signs:   Temp:  [97.8 F (36.6 C)-98.6 F (37 C)] 98.6 F (37 C) (05/10 1442) Pulse Rate:  [68-71] 68 (05/10 1442) Resp:  [17-20] 19 (05/10 1442) BP: (  84-99)/(43-60) 93/51 (05/10 1442) SpO2:  [89 %-98 %] 97 % (05/10 1442) Weight:  [109 kg-109.9 kg] 109.9 kg (05/10 0900) Last BM Date: 03/09/21  Weight change: Filed Weights   03/11/21 0122 03/12/21 0600 03/12/21 0900  Weight: 106.5 kg  109 kg 109.9 kg    Intake/Output:   Intake/Output Summary (Last 24 hours) at 03/12/2021 1509 Last data filed at 03/12/2021 1442 Gross per 24 hour  Intake 3486 ml  Output 650 ml  Net 2836 ml      Physical Exam    General:  . No resp difficulty HEENT: normal Neck: supple. JVP 8-9 .  Carotids 2+ bilat; no bruits. No lymphadenopathy or thyromegaly appreciated. Cor: PMI nondisplaced. Regular rate & rhythm. No rubs, gallops or murmurs. Lungs: clear Abdomen: obese, soft, nontender, nondistended. No hepatosplenomegaly. No bruits or masses. Good bowel sounds. Extremities: no cyanosis, clubbing, rash, edema Neuro: alert & orientedx3, cranial nerves grossly intact. moves all 4 extremities w/o difficulty. Affect flat   Telemetry   SR   EKG    SR with 1st degree heart block.   Labs   Basic Metabolic Panel: Recent Labs  Lab 03/25/2021 1619 03/11/21 0746 03/12/21 0357  NA 140 140 140  K 5.2* 5.2* 5.7*  CL 104 103 103  CO2 _0 GLUCOSE 53* 119* 100*  BUN 72* 76* 82*  CREATININE 4.48* 5.19* 5.31*  CALCIUM 8.3* 8.2* 8.1*  MG  --  2.4 2.5*  PHOS  --   --  7.2*    Liver Function Tests: Recent Labs  Lab 03/12/21 0357  ALBUMIN 2.6*   No results for input(s): LIPASE, AMYLASE in the last 168 hours. No results for input(s): AMMONIA in the last 168 hours.  CBC: Recent Labs  Lab 04/01/2021 1619 03/11/21 0746 03/12/21 0357  WBC 6.2 6.1 5.8  HGB 8.4* 8.3* 8.0*  HCT 28.3* 27.8* 27.2*  MCV 85.8 85.3 86.1  PLT 448* 425* 405*    Cardiac Enzymes: No results for input(s): CKTOTAL, CKMB, CKMBINDEX, TROPONINI in the last 168 hours.  BNP: BNP (last 3 results) Recent Labs    01/05/21 2213 01/26/21 2142 03/11/21 0746  BNP 3,522.2* 3,609.9* 2,676.5*    ProBNP (last 3 results) No results for input(s): PROBNP in the last 8760 hours.   CBG: Recent Labs  Lab 03/11/21 1202 03/11/21 1630 03/11/21 2105 03/12/21 0602 03/12/21 1138  GLUCAP 105* 113* 103* 88 105*     Coagulation Studies: No results for input(s): LABPROT, INR in the last 72 hours.   Imaging    No results found.   Medications:     Current Medications: . allopurinol  100 mg Oral Daily  . amiodarone  200 mg Oral Daily  . apixaban  5 mg Oral BID  . atorvastatin  40 mg Oral Daily  . azithromycin  250 mg Oral QHS  . darbepoetin (ARANESP) injection - NON-DIALYSIS  150 mcg Subcutaneous Q Mon-1800  . ferrous sulfate  325 mg Oral Q M,W,F-1800  . fluticasone  1 spray Each Nare QHS  . fluticasone furoate-vilanterol  1 puff Inhalation Daily   And  . umeclidinium bromide  1 puff Inhalation Daily  . gabapentin  300 mg Oral QHS  . insulin aspart  0-15 Units Subcutaneous TID WC  . insulin aspart protamine- aspart  5 Units Subcutaneous BID WC  . latanoprost  1 drop Both Eyes QHS  . pantoprazole  40 mg Oral BID  . sodium chloride flush  3 mL Intravenous Q12H  .  sodium zirconium cyclosilicate  10 g Oral BID  . tamsulosin  0.4 mg Oral Daily     Infusions: . cefTRIAXone (ROCEPHIN)  IV Stopped (03/11/21 1847)        Assessment/Plan   1. AKI on CKD Stage IV  -Nephrology following. -not felt to be HD candidate.  -Creatinine up to 5.3.  Off diuretics.   2. Hyperkalemia  K 5.7  Given Lokelma  3. CAP  On antibiotics per primary team  4. UTI  Urine CX with EColi  5. Chronic Biventricular HF --->Primarily RV Failure  -ECHO EF 40-45% severe RV failuure -Volume status does not look elevated. - Off diuretics.  - Not on GDMT due to elevated renal function.   6. Anemia  Hgb 8 Follow CBC  7. PAF  Last DC-CV 12/2020  Maintaining SR. Continue amio for now + eliquis.   8. [place] consulted  He is very tenuous and has historically been very difficult to manage due to poor insight. He is not a candidate for advanced therapies. HF team nothing to offer at this time.   Length of Stay: 1  [name], NP  03/12/2021, 3:09 PM  Advanced Heart Failure  Team Pager [phone] (M-F; 7a - 5p)  Please contact [facility] for night-coverage after hours (4p -7a ) and weekends on [email or web address]  Patient seen with NP, agree with the above note.   Patient presented with dizziness and dysuria, found to be hypotensive with suspected UTI and possible PNA.  AKI with creatinine now up to 5.3. Multiple admissions this year, noncompliant generally with outpatient followup.   General: NAD Neck: No JVD, no thyromegaly or thyroid nodule.  Lungs: Clear to auscultation bilaterally with normal respiratory effort. CV: Nondisplaced PMI.  Heart regular S1/S2, no S3/S4, no murmur.  No peripheral edema.  No carotid bruit.  Difficult to palpate pedal pulses.  Abdomen: Soft, nontender, no hepatosplenomegaly, no distention.  Skin: Intact without lesions or rashes.  Neurologic: Alert and oriented x 3.  Psych: Normal affect. Extremities: No clubbing or cyanosis.  HEENT: Normal.   AKI on CKD stage 3 in setting of suspected sepsis syndrome with E coli UTI and possible PNA.  SBP currently in 90s.  He is not volume overloaded on exam. Last echo in 2/22 with EF 45-50%, severe RV dysfunction.  - Continue to hold diuretics.  - Start midodrine 5 mg tid.  - Check procalcitonin and continue ceftriaxone/azithromycin for UTI, ?PNA.   - Follow creatinine over time, hopefully will trend down with improved BP and off diuretics.  He would be a poor HD candidate with severe RV dysfunction and poor compliance.   He remains in NSR, continue amiodarone and apixaban.   Loralie Champagne 03/12/2021 5:07 PM

## 2021-03-13 ENCOUNTER — Inpatient Hospital Stay (HOSPITAL_COMMUNITY): Payer: No Typology Code available for payment source

## 2021-03-13 DIAGNOSIS — R55 Syncope and collapse: Secondary | ICD-10-CM

## 2021-03-13 DIAGNOSIS — N179 Acute kidney failure, unspecified: Secondary | ICD-10-CM | POA: Diagnosis not present

## 2021-03-13 DIAGNOSIS — I5022 Chronic systolic (congestive) heart failure: Secondary | ICD-10-CM

## 2021-03-13 DIAGNOSIS — J9611 Chronic respiratory failure with hypoxia: Secondary | ICD-10-CM

## 2021-03-13 DIAGNOSIS — J189 Pneumonia, unspecified organism: Secondary | ICD-10-CM | POA: Diagnosis not present

## 2021-03-13 DIAGNOSIS — I361 Nonrheumatic tricuspid (valve) insufficiency: Secondary | ICD-10-CM

## 2021-03-13 LAB — CBC
HCT: 28.5 % — ABNORMAL LOW (ref 39.0–52.0)
Hemoglobin: 8.6 g/dL — ABNORMAL LOW (ref 13.0–17.0)
MCH: 25.9 pg — ABNORMAL LOW (ref 26.0–34.0)
MCHC: 30.2 g/dL (ref 30.0–36.0)
MCV: 85.8 fL (ref 80.0–100.0)
Platelets: 420 10*3/uL — ABNORMAL HIGH (ref 150–400)
RBC: 3.32 MIL/uL — ABNORMAL LOW (ref 4.22–5.81)
RDW: 19.8 % — ABNORMAL HIGH (ref 11.5–15.5)
WBC: 6.4 10*3/uL (ref 4.0–10.5)
nRBC: 0 % (ref 0.0–0.2)

## 2021-03-13 LAB — RENAL FUNCTION PANEL
Albumin: 2.6 g/dL — ABNORMAL LOW (ref 3.5–5.0)
Anion gap: 12 (ref 5–15)
BUN: 88 mg/dL — ABNORMAL HIGH (ref 8–23)
CO2: 23 mmol/L (ref 22–32)
Calcium: 8.3 mg/dL — ABNORMAL LOW (ref 8.9–10.3)
Chloride: 103 mmol/L (ref 98–111)
Creatinine, Ser: 5.51 mg/dL — ABNORMAL HIGH (ref 0.61–1.24)
GFR, Estimated: 10 mL/min — ABNORMAL LOW (ref >60)
Glucose, Bld: 67 mg/dL — ABNORMAL LOW (ref 70–99)
Phosphorus: 7.8 mg/dL — ABNORMAL HIGH (ref 2.5–4.6)
Potassium: 5.2 mmol/L — ABNORMAL HIGH (ref 3.5–5.1)
Sodium: 138 mmol/L (ref 135–145)

## 2021-03-13 LAB — TYPE AND SCREEN
ABO/RH(D): B POS
Antibody Screen: NEGATIVE
Unit division: 0

## 2021-03-13 LAB — ECHOCARDIOGRAM COMPLETE
AR max vel: 2.22 cm2
AV Area VTI: 2.38 cm2
AV Area mean vel: 2.01 cm2
AV Mean grad: 3 mmHg
AV Peak grad: 5 mmHg
Ao pk vel: 1.12 m/s
Area-P 1/2: 4.15 cm2
S' Lateral: 2.9 cm
Weight: 3897.73 oz

## 2021-03-13 LAB — URINE CULTURE: Culture: 40000 — AB

## 2021-03-13 LAB — BPAM RBC
Blood Product Expiration Date: 202205272359
ISSUE DATE / TIME: 202205101125
Unit Type and Rh: 7300

## 2021-03-13 LAB — GLUCOSE, CAPILLARY
Glucose-Capillary: 62 mg/dL — ABNORMAL LOW (ref 70–99)
Glucose-Capillary: 81 mg/dL (ref 70–99)
Glucose-Capillary: 146 mg/dL — ABNORMAL HIGH (ref 70–99)
Glucose-Capillary: 118 mg/dL — ABNORMAL HIGH (ref 70–99)
Glucose-Capillary: 110 mg/dL — ABNORMAL HIGH (ref 70–99)

## 2021-03-13 LAB — MAGNESIUM: Magnesium: 2.3 mg/dL (ref 1.7–2.4)

## 2021-03-13 MED ORDER — INSULIN ASPART 100 UNIT/ML IJ SOLN: 0.0000 [IU] | Freq: Every day | INTRAMUSCULAR | Status: DC

## 2021-03-13 MED ORDER — INSULIN ASPART 100 UNIT/ML IJ SOLN
0.0000 [IU] | Freq: Three times a day (TID) | INTRAMUSCULAR | Status: DC
  Administered 2021-03-15: 1 [IU] via SUBCUTANEOUS

## 2021-03-13 MED ORDER — PERFLUTREN LIPID MICROSPHERE
1.0000 mL | INTRAVENOUS | Status: AC | PRN
  Administered 2021-03-13: 4 mL via INTRAVENOUS
  Filled 2021-03-13: qty 10

## 2021-03-13 NOTE — Evaluation (Addendum)
Occupational Therapy Evaluation Patient Details Name: Nicholas Caldwell MRN: [id number] DOB: 1946-07-13 Today's Date: 03/13/2021    History of Present Illness Nicholas Caldwell is a 75 y.o. male with medical history significant of CHF with right ventricular dysfunction, anemia, CKD 3B, A. fib, CVA, chronic respiratory failure with hypoxia, COPD, CAD, diabetes, hypertension, hyperlipidemia, OSA who presents with multiple complaints including dizziness, dysuria, fevers, cough. workup for syncope; PNA, poss UTI, AKI   Clinical Impression   Pt in bed upon arrival and reluctant to participate. He agreed to sit EOB and required increased time and effort. Pt sat EOB to wash face and hands, allow OT to test UB strength and AROM, but refused to stand from EOB with RW, refused to answer some of my questions and stated "why do ya'll keep coming up here to bother me, it's getting on my nerves and I don't want anymore therapy".Pt  wanted this OT to lift his legs back onto the bed but told pt needed to try and do it himself and pt able to position self back to supine with no physical assist and was able to scoot self to [facility]. Pt states that he does not want therapy (RN sent secure message about pt's behavior/refusal). OT will sign off, MD can re order if pt becomes agreeable      Follow Up Recommendations  Other (comment) (home with 24 hr sup/assist)    Equipment Recommendations  None recommended by OT    Recommendations for Other Services       Precautions / Restrictions Precautions Precautions: Fall;Other (comment) Precaution Comments: watch 02 and BP Restrictions Weight Bearing Restrictions: No      Mobility Bed Mobility Overal bed mobility: Needs Assistance Bed Mobility: Supine to Sit;Sit to Supine     Supine to sit: Min guard;HOB elevated Sit to supine: Min guard   General bed mobility comments: pt told OT to assist him with LEs back onto bed, however OT asked pt to try to do it himself and pt  able to do so with no physical assist    Transfers                 General transfer comment: Pt refused to stand with RW and any other OOB activity    Balance Overall balance assessment: Needs assistance Sitting-balance support: Feet supported;No upper extremity supported Sitting balance-Leahy Scale: Fair         Standing balance comment: pt refused                           ADL either performed or assessed with clinical judgement   ADL                                         General ADL Comments: Pt sat EOB reluctantly washed face and hands when asked, but refused any other ADL task     Vision Patient Visual Report: No change from baseline       Perception     Praxis      Pertinent Vitals/Pain Pain Assessment: Faces Faces Pain Scale: Hurts a little bit Pain Location: chronic back pain Pain Intervention(s): Monitored during session;Repositioned     Hand Dominance Right   Extremity/Trunk Assessment             Communication Communication Communication: HOH   Cognition Arousal/Alertness: Awake/alert Behavior  During Therapy: Agitated;Flat affect Overall Cognitive Status: Within Functional Limits for tasks assessed                                     General Comments       Exercises     Shoulder Instructions      Home Living Family/patient expects to be discharged to:: Private residence Living Arrangements: Spouse/significant other Available Help at Discharge: Family;Available 24 hours/day Type of Home: House Home Access: Stairs to enter [employer] of Steps: 1 Entrance Stairs-Rails: None Home Layout: One level     Bathroom Shower/Tub: Occupational psychologist: Standard Bathroom Accessibility: Yes How Accessible: Accessible via walker Home Equipment: [place] - 2 wheels;Wheelchair - manual;Bedside commode;Grab bars - tub/shower;Shower Public house manager - 4  wheels   Additional Comments: on 4L supplemental O2 typically at home      Prior Functioning/Environment Level of Independence: Needs assistance  Gait / Transfers Assistance Needed: was able to stand and transfer to [facility] or [facility] using RW; short household distances ADL's / Homemaking Assistance Needed: Spouse assists with LB ADLs            OT Problem List:        OT Treatment/Interventions:      OT Goals(Current goals can be found in the care plan section) Acute Rehab OT Goals Patient Stated Goal: leave me alone  OT Frequency:     Barriers to D/C:            Co-evaluation              AM-PAC OT "6 Clicks" Daily Activity     Outcome Measure Help from another person eating meals?: None Help from another person taking care of personal grooming?: A Little Help from another person toileting, which includes using toliet, bedpan, or urinal?: Total Help from another person bathing (including washing, rinsing, drying)?: Total Help from another person to put on and taking off regular upper body clothing?: Total Help from another person to put on and taking off regular lower body clothing?: Total 6 Click Score: 11   End of Session    Activity Tolerance: Treatment limited secondary to agitation Patient left: in bed;with call bell/phone within reach;with bed alarm set  OT Visit Diagnosis: Muscle weakness (generalized) (M62.81)                Time: 3013-1438 OT Time Calculation (min): 18 min Charges:  OT General Charges $OT Visit: 1 Visit OT Evaluation $OT Eval Moderate Complexity: 1 Mod    Britt Bottom 03/13/2021, 2:45 PM

## 2021-03-13 NOTE — Progress Notes (Signed)
CSW completed [facility] 72 hour notification. Notification ID is: [id number]

## 2021-03-13 NOTE — Progress Notes (Signed)
Subjective:  BP soft again overnight-   625 of UOP recorded which was an improvement-  K better but kidney function overall a little worse.  He is alert, feels OK.  He refused conversation with palliative care.  [name] saw- did not have anything to offer and agrees that dialysis is not a good option for Mr. Fawcett- started midodrine-  He is feeling OK this AM -  Using bipap for his "allergies "  Objective Vital signs in last 24 hours: Vitals:   03/13/21 0320 03/13/21 0642 03/13/21 0813 03/13/21 0910  BP: (!) 101/48   (!) 106/59  Pulse: 68   75  Resp: 18   16  Temp: 97.6 F (36.4 C)     TempSrc: Axillary     SpO2:   95% 94%  Weight:  110.5 kg     Weight change: 0.855 kg  Intake/Output Summary (Last 24 hours) at 03/13/2021 1024 Last data filed at 03/13/2021 0910 Gross per 24 hour  Intake 1406.25 ml  Output 475 ml  Net 931.25 ml    Assessment/Plan: 75 year old BM with severe biventricular heart failure, COPD and CKD-  Failure to thrive of sorts with this being the 6th hospitalization in 2022.   Now with repeated A on CRF 1.Renal- A on CRF.  Has baseline severe CKD thought due to cardiorenal syndrome.  This time is different in that he is not volume overloaded- in fact diuretics have been put on hold given that he is orthostatic and he was getting ivf.  He does not appear to be uremic -  I have agreed with his primary nephrologist Dr. Marval Regal that dialysis is not a good option here-  Would not make him any less medically complex and overall systemic perfusion would be difficult to attain if he needed any UF with HD at all.  His low BP the last 24 hours strengthens this argument.  I talked to him today about how serious this situation is, reiterated that I dont think his heart is strong enough for dialysis.  He is having a tough time accepting.  Will continue to attempt to manage medically and hope that renal function will turn around.  He continues to have poor insight says he is counting on  me to save him 2. Hypertension/volume  - right now does not seem overloaded but this is how he usually is.  I agreed with management initially to hold diuretics and to hydrate but need to be careful given his history-  I have stopped IVF.  There do not seem to be any options to increase BP and increase perfusion per [name].   Started on midodrine 3. ID-  uti and possible PNA-  rocephin and azithro-  He feels better 4. Uric acid of over 11-  Likely is not helping-  Allopurinol added 5. Anemia  - due to frequent hospitalizations and CKD-  have added ESA-  Avoid iv iron given current infections 6.  Hyperkalemia-   giving lokelma 7.  Consider palliative care involement -  he refuses    Louis Meckel    Labs: Basic Metabolic Panel: Recent Labs  Lab 03/11/21 0746 03/12/21 0357 03/13/21 0316  NA 140 140 138  K 5.2* 5.7* 5.2*  CL 103 103 103  CO2 _0 GLUCOSE 119* 100* 67*  BUN 76* 82* 88*  CREATININE 5.19* 5.31* 5.51*  CALCIUM 8.2* 8.1* 8.3*  PHOS  --  7.2* 7.8*   Liver Function Tests:  Recent Labs  Lab 03/12/21 0357 03/13/21 0316  ALBUMIN 2.6* 2.6*   No results for input(s): LIPASE, AMYLASE in the last 168 hours. No results for input(s): AMMONIA in the last 168 hours. CBC: Recent Labs  Lab 03/26/2021 1619 03/11/21 0746 03/12/21 0357 03/12/21 1620 03/13/21 0316  WBC 6.2 6.1 5.8  --  6.4  HGB 8.4* 8.3* 8.0* 8.1* 8.6*  HCT 28.3* 27.8* 27.2* 27.1* 28.5*  MCV 85.8 85.3 86.1  --  85.8  PLT 448* 425* 405*  --  420*   Cardiac Enzymes: No results for input(s): CKTOTAL, CKMB, CKMBINDEX, TROPONINI in the last 168 hours. CBG: Recent Labs  Lab 03/12/21 1138 03/12/21 1641 03/12/21 2124 03/13/21 0625 03/13/21 0645  GLUCAP 105* 139* 81 62* 81    Iron Studies: No results for input(s): IRON, TIBC, TRANSFERRIN, FERRITIN in the last 72 hours. Studies/Results: US RENAL  Result Date: 03/11/2021 CLINICAL DATA:  Acute kidney injury EXAM: RENAL / URINARY TRACT  ULTRASOUND COMPLETE COMPARISON:  01/27/2021 FINDINGS: Right Kidney: Renal measurements: 10.1 x 4.7 x 5.7 cm = volume: 139 mL. Echogenicity within normal limits. No mass or hydronephrosis visualized. Left Kidney: Renal measurements: 11.4 x 5.5 x 6.0 cm = volume: 198 mL. 2.2 cm cyst in the midpole. Normal echotexture. No hydronephrosis. Bladder: Partially distended, grossly unremarkable. Other: None. IMPRESSION: No acute findings.  No hydronephrosis. Electronically Signed   By: Rolm Baptise M.D.   On: 03/11/2021 11:53   Medications: Infusions: . cefTRIAXone (ROCEPHIN)  IV Stopped (03/12/21 1826)    Scheduled Medications: . allopurinol  100 mg Oral Daily  . amiodarone  200 mg Oral Daily  . apixaban  5 mg Oral BID  . atorvastatin  40 mg Oral Daily  . azithromycin  250 mg Oral QHS  . darbepoetin (ARANESP) injection - NON-DIALYSIS  150 mcg Subcutaneous Q Mon-1800  . ferrous sulfate  325 mg Oral Q M,W,F-1800  . fluticasone  1 spray Each Nare QHS  . fluticasone furoate-vilanterol  1 puff Inhalation Daily   And  . umeclidinium bromide  1 puff Inhalation Daily  . gabapentin  300 mg Oral QHS  . insulin aspart  0-15 Units Subcutaneous TID WC  . insulin aspart protamine- aspart  5 Units Subcutaneous BID WC  . latanoprost  1 drop Both Eyes QHS  . midodrine  5 mg Oral TID WC  . pantoprazole  40 mg Oral BID  . sodium chloride flush  3 mL Intravenous Q12H  . sodium zirconium cyclosilicate  10 g Oral BID  . tamsulosin  0.4 mg Oral Daily    have reviewed scheduled and prn medications.  Physical Exam: General: alert, appropriately upset to hear the news regarding his heart and kidneys Heart: RRR Lungs: dec BS at bases-  Big O2 req Abdomen: obese, soft Extremities: min edema    03/13/2021,10:24 AM  LOS: 2 days

## 2021-03-13 NOTE — Plan of Care (Signed)
  Problem: Safety: Goal: Ability to remain free from injury will improve Outcome: Progressing   Problem: Activity: Goal: Risk for activity intolerance will decrease Outcome: Not Progressing   Problem: Elimination: Goal: Will not experience complications related to bowel motility Outcome: Not Progressing   Patient refused mobility from OT and RN.

## 2021-03-13 NOTE — Progress Notes (Incomplete)
  Echocardiogram 2D Echocardiogram has been performed.  Cammy Brochure 03/13/2021, 10:53 AM

## 2021-03-13 NOTE — Progress Notes (Signed)
[facility]  PROGRESS NOTE  Nicholas Caldwell [id number]:[id number] DOB: 06/04/1946 DOA: 03/29/2021 PCP: [name], MD   Brief HPI:   75 year old male with past medical history of biventricular heart failure, CKD stage IIIb, COPD on 4 L/min of oxygen, OSA not on CPAP, atrial fibrillation, CVA, CAD, diabetes mellitus type 2, hypertension, hyperlipidemia, chronic pain syndrome presents with dysuria, lightheadedness, fever productive cough for 2 days.  He was admitted for severe sepsis due to pneumonia and UTI with AKI.  Patient started on IV ceftriaxone, azithromycin.    Subjective   Patient seen and examined, denies shortness of breath.  Not a hemodialysis candidate as per nephrology and cardiology.   Assessment/Plan:     1. Acute kidney injury on CKD stage III-patient has baseline CKD from cardiorenal syndrome.  However at this time likely from hypotension along with use of diuretics.   Patient takes torsemide and metolazone at home.  Renal ultrasound showed no acute finding.  Creatinine is up to 5.51 today.  Diuretics are currently on hold.  Nephrology has seen the patient and deemed not a good candidate for hemodialysis.  As per nephrology overall systemic perfusion will be difficult to obtain if needed for any UF with HD at all.  Patient blood pressure has been low.  Palliative care was consulted, however the patient did not want to interact with palliative care.  Nephrologist had long discussion with the patient and explained to him that he would not be a good candidate for hemodialysis. 2. Community-acquired pneumonia-chest x-ray was concerning for left middle lobe and left lower lobe infiltrate.  Patient started on ceftriaxone and Zithromax. 3. UTI-complaint of dysuria.  UA showed pyuria.  Urine culture grew 40,000 colonies of E. coli.  Started on IV ceftriaxone as above.  Follow culture/sensitivity. 4. Syncopal event/persistent hypotension-patient has persistent hypotension, had a syncopal  event.  CT head/CT cervical spine showed no acute finding.  Continue monitoring orthostatic vital signs.  Diuretics are on hold.  Cardiology has been consulted.  Patient started on midodrine 5 mg 3 times daily. 5. Biventricular heart failure-TTE in February 2022 with EF of 45 to 50%.  Severely enlarged RV.  He is on torsemide and metolazone at home.  Currently diuretics on hold.  Repeat echocardiogram shows EF of 50 to 44%, grade 2 diastolic dysfunction. 6. Paroxysmal atrial fibrillation-s/p DCCV in February 2022.  Heart rate controlled.  Continue amiodarone, Eliquis. 7. Chronic COPD/chronic hypoxemic respiratory failure-patient is on [place] per minute of oxygen at baseline.  Continue home supplemental oxygen.  Breathing treatments as needed. 8. Diabetes mellitus type 2-patient having episodes of hypoglycemia at night.  We will switch sliding scale to very sensitive sliding scale with NovoLog 0 to 6 units 3 times daily.    Scheduled medications:   . allopurinol  100 mg Oral Daily  . amiodarone  200 mg Oral Daily  . apixaban  5 mg Oral BID  . atorvastatin  40 mg Oral Daily  . azithromycin  250 mg Oral QHS  . darbepoetin (ARANESP) injection - NON-DIALYSIS  150 mcg Subcutaneous Q Mon-1800  . ferrous sulfate  325 mg Oral Q M,W,F-1800  . fluticasone  1 spray Each Nare QHS  . fluticasone furoate-vilanterol  1 puff Inhalation Daily   And  . umeclidinium bromide  1 puff Inhalation Daily  . gabapentin  300 mg Oral QHS  . insulin aspart  0-5 Units Subcutaneous QHS  . insulin aspart  0-6 Units Subcutaneous TID WC  . insulin aspart protamine-  aspart  5 Units Subcutaneous BID WC  . latanoprost  1 drop Both Eyes QHS  . midodrine  5 mg Oral TID WC  . pantoprazole  40 mg Oral BID  . sodium chloride flush  3 mL Intravenous Q12H  . sodium zirconium cyclosilicate  10 g Oral BID  . tamsulosin  0.4 mg Oral Daily         Data Reviewed:   CBG:  Recent Labs  Lab 03/12/21 1641 03/12/21 2124  03/13/21 0625 03/13/21 0645 03/13/21 1232  GLUCAP 139* 81 62* 81 146*    SpO2: 90 % O2 Flow Rate (L/min): 6 L/min    Vitals:   03/13/21 0813 03/13/21 0910 03/13/21 1222 03/13/21 1235  BP:  (!) 106/59 108/71 (!) 95/50  Pulse:  75 66 73  Resp:  _0 Temp:   98 F (36.7 C) 98.5 F (36.9 C)  TempSrc:   Oral Oral  SpO2: 95% 94% 99% 90%  Weight:         Intake/Output Summary (Last 24 hours) at 03/13/2021 1433 Last data filed at 03/13/2021 1241 Gross per 24 hour  Intake 1155 ml  Output 1425 ml  Net -270 ml    05/09 1901 - 05/11 0700 In: 5643 [P.O.:1076; I.V.:1910] Out: 1125 [Urine:1125]  Filed Weights   03/12/21 0600 03/12/21 0900 03/13/21 0642  Weight: 109 kg 109.9 kg 110.5 kg    CBC:  Recent Labs  Lab 03/21/2021 1619 03/11/21 0746 03/12/21 0357 03/12/21 1620 03/13/21 0316  WBC 6.2 6.1 5.8  --  6.4  HGB 8.4* 8.3* 8.0* 8.1* 8.6*  HCT 28.3* 27.8* 27.2* 27.1* 28.5*  PLT 448* 425* 405*  --  420*  MCV 85.8 85.3 86.1  --  85.8  MCH 25.5* 25.5* 25.3*  --  25.9*  MCHC 29.7* 29.9* 29.4*  --  30.2  RDW 19.8* 19.9* 19.9*  --  19.8*    Complete metabolic panel:  Recent Labs  Lab 03/15/2021 1619 03/11/21 0746 03/12/21 0357 03/12/21 1735 03/13/21 0316  NA 140 140 140  --  138  K 5.2* 5.2* 5.7*  --  5.2*  CL 104 103 103  --  103  CO2 _1 --  23  GLUCOSE 53* 119* 100*  --  67*  BUN 72* 76* 82*  --  88*  CREATININE 4.48* 5.19* 5.31*  --  5.51*  CALCIUM 8.3* 8.2* 8.1*  --  8.3*  ALBUMIN  --   --  2.6*  --  2.6*  MG  --  2.4 2.5*  --  2.3  PROCALCITON  --   --   --  0.48  --   BNP  --  2,676.5*  --   --   --     No results for input(s): LIPASE, AMYLASE in the last 168 hours.  Recent Labs  Lab 03/11/21 0008 03/11/21 0746 03/12/21 1735  BNP  --  3,295.1*  --   PROCALCITON  --   --  0.48  SARSCOV2NAA NEGATIVE  --   --     ------------------------------------------------------------------------------------------------------------------ No  results for input(s): CHOL, HDL, LDLCALC, TRIG, CHOLHDL, LDLDIRECT in the last 72 hours.  Lab Results  Component Value Date   HGBA1C 6.8 (H) 01/28/2021   ------------------------------------------------------------------------------------------------------------------ No results for input(s): TSH, T4TOTAL, T3FREE, THYROIDAB in the last 72 hours.  Invalid input(s): FREET3 ------------------------------------------------------------------------------------------------------------------ No results for input(s): VITAMINB12, FOLATE, FERRITIN, TIBC, IRON, RETICCTPCT in the last 72 hours.  Coagulation profile No  results for input(s): INR, PROTIME in the last 168 hours. No results for input(s): DDIMER in the last 72 hours.  Cardiac Enzymes No results for input(s): CKTOTAL, CKMB, CKMBINDEX, TROPONINI in the last 168 hours.  ------------------------------------------------------------------------------------------------------------------    Component Value Date/Time   BNP 2,676.5 (H) 03/11/2021 0746   BNP 191.4 (H) 03/24/2016 1656     Antibiotics: Anti-infectives (From admission, onward)   Start     Dose/Rate Route Frequency Ordered Stop   03/12/21 2200  azithromycin (ZITHROMAX) tablet 250 mg        250 mg Oral Daily at bedtime 03/12/21 0854 03/15/21 2159   03/11/21 2000  azithromycin (ZITHROMAX) 250 mg in dextrose 5 % 125 mL IVPB  Status:  Discontinued       "Followed by" Linked Group Details   250 mg 125 mL/hr over 60 Minutes Intravenous Every 24 hours 03/15/2021 1828 03/12/21 0854   03/04/2021 1830  azithromycin (ZITHROMAX) 500 mg in sodium chloride 0.9 % 250 mL IVPB       "Followed by" Linked Group Details   500 mg 250 mL/hr over 60 Minutes Intravenous  Once 03/15/2021 1828 03/15/2021 2056   03/08/2021 1700  cefTRIAXone (ROCEPHIN) 1 g in sodium chloride 0.9 % 100 mL IVPB        1 g 200 mL/hr over 30 Minutes Intravenous Every 24 hours 03/25/2021 1659 03/15/21 2359   03/09/2021 1700   doxycycline (VIBRA-TABS) tablet 100 mg  Status:  Discontinued        100 mg Oral Every 12 hours 03/26/2021 1659 03/22/2021 1828       Radiology Reports  ECHOCARDIOGRAM COMPLETE  Result Date: 03/13/2021    ECHOCARDIOGRAM REPORT   Patient Name:   Nicholas Caldwell Date of Exam: 03/13/2021 Medical Rec #:  [id number]    Height:       72.0 in Accession #:    [id number]   Weight:       243.6 lb Date of Birth:  05-22-1946    BSA:          2.317 m Patient Age:    55 years     BP:           101/48 mmHg Patient Gender: M            HR:           68 bpm. Exam Location:  Inpatient Procedure: 2D Echo, Cardiac Doppler and Color Doppler Indications:    Syncope  History:        Patient has prior history of Echocardiogram examinations, most                 recent 09/26/2020. CAD, COPD, Arrythmias:Atrial Flutter; Risk                 Factors:Hypertension, Diabetes, Dyslipidemia and Former Smoker.  Sonographer:    Cammy Brochure Referring Phys: [facility]  Sonographer Comments: Suboptimal apical window, patient is morbidly obese and suboptimal subcostal window. Image acquisition challenging due to patient body habitus and Image acquisition challenging due to COPD. IMPRESSIONS  1. Left ventricular ejection fraction, by estimation, is 50 to 55%. The left ventricle has low normal function. The left ventricle has no regional wall motion abnormalities. Left ventricular diastolic parameters are consistent with Grade II diastolic dysfunction (pseudonormalization).  2. Peak RV-RA gradient 53 mmHg. Right ventricular systolic function is severely reduced. The right ventricular size is severely enlarged. D-shaped interventricular septum suggestive of RV pressure/volume overload.  3. Left  atrial size was mildly dilated.  4. Right atrial size was severely dilated.  5. The mitral valve is normal in structure. No evidence of mitral valve regurgitation. No evidence of mitral stenosis.  6. Tricuspid valve regurgitation is moderate.  7. The aortic  valve is tricuspid. Aortic valve regurgitation is trivial. No aortic stenosis is present.  8. The IVC was not visualized. FINDINGS  Left Ventricle: Left ventricular ejection fraction, by estimation, is 50 to 55%. The left ventricle has low normal function. The left ventricle has no regional wall motion abnormalities. The left ventricular internal cavity size was normal in size. There is no left ventricular hypertrophy. Left ventricular diastolic parameters are consistent with Grade II diastolic dysfunction (pseudonormalization). Right Ventricle: Peak RV-RA gradient 53 mmHg. The right ventricular size is severely enlarged. No increase in right ventricular wall thickness. Right ventricular systolic function is severely reduced. Left Atrium: Left atrial size was mildly dilated. Right Atrium: Right atrial size was severely dilated. Pericardium: Trivial pericardial effusion is present. Mitral Valve: The mitral valve is normal in structure. No evidence of mitral valve regurgitation. No evidence of mitral valve stenosis. Tricuspid Valve: The tricuspid valve is normal in structure. Tricuspid valve regurgitation is moderate. Aortic Valve: The aortic valve is tricuspid. Aortic valve regurgitation is trivial. No aortic stenosis is present. Aortic valve mean gradient measures 3.0 mmHg. Aortic valve peak gradient measures 5.0 mmHg. Aortic valve area, by VTI measures 2.38 cm. Pulmonic Valve: The pulmonic valve was not well visualized. Pulmonic valve regurgitation is not visualized. Aorta: The aortic root is normal in size and structure. IAS/Shunts: No atrial level shunt detected by color flow Doppler.  LEFT VENTRICLE PLAX 2D LVIDd:         4.80 cm  Diastology LVIDs:         2.90 cm  LV e' medial:    4.53 cm/s LV PW:         1.50 cm  LV E/e' medial:  13.8 LV IVS:        0.90 cm  LV e' lateral:   7.08 cm/s LVOT diam:     2.00 cm  LV E/e' lateral: 8.8 LV SV:         46 LV SV Index:   20 LVOT Area:     3.14 cm  RIGHT VENTRICLE RV  Basal diam:  6.70 cm RV S prime:     10.00 cm/s TAPSE (M-mode): 1.5 cm LEFT ATRIUM             Index       RIGHT ATRIUM           Index LA diam:        3.90 cm 1.68 cm/m  RA Area:     32.60 cm LA Vol (A2C):   93.7 ml 40.44 ml/m RA Volume:   130.00 ml 56.11 ml/m LA Vol (A4C):   67.4 ml 29.09 ml/m LA Biplane Vol: 78.0 ml 33.67 ml/m  AORTIC VALVE AV Area (Vmax):    2.22 cm AV Area (Vmean):   2.01 cm AV Area (VTI):     2.38 cm AV Vmax:           112.00 cm/s AV Vmean:          79.500 cm/s AV VTI:            0.195 m AV Peak Grad:      5.0 mmHg AV Mean Grad:      3.0 mmHg LVOT Vmax:  79.10 cm/s LVOT Vmean:        50.800 cm/s LVOT VTI:          0.148 m LVOT/AV VTI ratio: 0.76 MITRAL VALVE               TRICUSPID VALVE MV Area (PHT): 4.15 cm    TR Peak grad:   53.3 mmHg MV Decel Time: 183 msec    TR Vmax:        365.00 cm/s MV E velocity: 62.60 cm/s MV A velocity: 48.00 cm/s  SHUNTS MV E/A ratio:  1.30        Systemic VTI:  0.15 m                            Systemic Diam: 2.00 cm Loralie Champagne MD Electronically signed by Loralie Champagne MD Signature Date/Time: 03/13/2021/1:16:09 PM    Final       DVT prophylaxis: Apixaban  Code Status: Full code  Family Communication: No family at bedside   Consultants:  Cardiology  Nephrology  Palliative care  Procedures:      Objective    Physical Examination:   General-appears in no acute distress Heart-S1-S2, regular, no murmur auscultated Lungs-decreased breath sounds at lung bases Abdomen-soft, nontender, no organomegaly Extremities-mild edema in the lower extremities Neuro-alert, oriented x3, no focal deficit noted  Status is: Inpatient  Dispo: The patient is from: Home              Anticipated d/c is to: Home              Anticipated d/c date is: 03/16/2021              Patient currently not stable for discharge  Barrier to discharge-ongoing treatment for CHF, acute kidney injury  COVID-19 Labs  No results for input(s):  DDIMER, FERRITIN, LDH, CRP in the last 72 hours.  Lab Results  Component Value Date   [place] NEGATIVE 03/11/2021   [place] NEGATIVE 01/26/2021   [place] NEGATIVE 01/06/2021   [place] NEGATIVE 11/21/2020    Microbiology  Recent Results (from the past 240 hour(s))  Urine culture     Status: Abnormal   Collection Time: 03/27/2021  7:06 PM   Specimen: Urine, Clean Catch  Result Value Ref Range Status   Specimen Description URINE, CLEAN CATCH  Final   Special Requests   Final    NONE Performed at [facility], [place] [address] [place], [place] [address]    Culture 40,000 COLONIES/mL ESCHERICHIA COLI (A)  Final   Report Status 03/13/2021 FINAL  Final   Organism ID, Bacteria ESCHERICHIA COLI (A)  Final      Susceptibility   Escherichia coli - MIC*    AMPICILLIN <=2 SENSITIVE Sensitive     CEFAZOLIN <=4 SENSITIVE Sensitive     CEFEPIME <=0.12 SENSITIVE Sensitive     CEFTRIAXONE <=0.25 SENSITIVE Sensitive     CIPROFLOXACIN <=0.25 SENSITIVE Sensitive     GENTAMICIN <=1 SENSITIVE Sensitive     IMIPENEM <=0.25 SENSITIVE Sensitive     NITROFURANTOIN <=16 SENSITIVE Sensitive     TRIMETH/SULFA <=20 SENSITIVE Sensitive     AMPICILLIN/SULBACTAM <=2 SENSITIVE Sensitive     PIP/TAZO <=4 SENSITIVE Sensitive     * 40,000 COLONIES/mL ESCHERICHIA COLI  Resp Panel by RT-PCR (Flu A&B, Covid) Nasopharyngeal Swab     Status: None   Collection Time: 03/11/21 12:08 AM   Specimen: Nasopharyngeal Swab; Nasopharyngeal(NP) swabs  in vial transport medium  Result Value Ref Range Status   SARS Coronavirus 2 by RT PCR NEGATIVE NEGATIVE Final    Comment: (NOTE) SARS-CoV-2 target nucleic acids are NOT DETECTED.  The SARS-CoV-2 RNA is generally detectable in upper respiratory specimens during the acute phase of infection. The lowest concentration of SARS-CoV-2 viral copies this assay can detect is 138 copies/mL. A negative result does not preclude SARS-Cov-2 infection and should not be  used as the sole basis for treatment or other patient management decisions. A negative result may occur with  improper specimen collection/handling, submission of specimen other than nasopharyngeal swab, presence of viral mutation(s) within the areas targeted by this assay, and inadequate number of viral copies(<138 copies/mL). A negative result must be combined with clinical observations, patient history, and epidemiological information. The expected result is Negative.  Fact Sheet for Patients:  EntrepreneurPulse.com.au  Fact Sheet for Healthcare Providers:  IncredibleEmployment.be  This test is no t yet approved or cleared by the [place] FDA and  has been authorized for detection and/or diagnosis of SARS-CoV-2 by FDA under an Emergency Use Authorization (EUA). This EUA will remain  in effect (meaning this test can be used) for the duration of the COVID-19 declaration under Section 564(b)(1) of the Act, 21 U.S.C.section 360bbb-3(b)(1), unless the authorization is terminated  or revoked sooner.       Influenza A by PCR NEGATIVE NEGATIVE Final   Influenza B by PCR NEGATIVE NEGATIVE Final    Comment: (NOTE) The Xpert Xpress SARS-CoV-2/FLU/RSV plus assay is intended as an aid in the diagnosis of influenza from Nasopharyngeal swab specimens and should not be used as a sole basis for treatment. Nasal washings and aspirates are unacceptable for Xpert Xpress SARS-CoV-2/FLU/RSV testing.  Fact Sheet for Patients: EntrepreneurPulse.com.au  Fact Sheet for Healthcare Providers: IncredibleEmployment.be  This test is not yet approved or cleared by the [place] FDA and has been authorized for detection and/or diagnosis of SARS-CoV-2 by FDA under an Emergency Use Authorization (EUA). This EUA will remain in effect (meaning this test can be used) for the duration of the COVID-19 declaration under Section  564(b)(1) of the Act, 21 U.S.C. section 360bbb-3(b)(1), unless the authorization is terminated or revoked.  Performed at [facility], [place] [address], [place], [place] [address]     Pressure Injury 01/27/21 Buttocks Left;Mid Stage 2 -  Partial thickness loss of dermis presenting as a shallow open injury with a red, pink wound bed without slough. pink 0.5 cm x 0.5 cm (Active)  01/27/21 0100  Location: Buttocks  Location Orientation: Left;Mid  Staging: Stage 2 -  Partial thickness loss of dermis presenting as a shallow open injury with a red, pink wound bed without slough.  Wound Description (Comments): pink 0.5 cm x 0.5 cm  Present on Admission: Yes     Pressure Injury 03/11/21 Coccyx Mid Stage 2 -  Partial thickness loss of dermis presenting as a shallow open injury with a red, pink wound bed without slough. (Active)  03/11/21 0140  Location: Coccyx  Location Orientation: Mid  Staging: Stage 2 -  Partial thickness loss of dermis presenting as a shallow open injury with a red, pink wound bed without slough.  Wound Description (Comments):   Present on Admission: Yes          [place]   [facility] If 7PM-7AM, please contact night-coverage at [email or web address], Office  [phone]   03/13/2021, 2:33 PM  LOS: 2 days

## 2021-03-13 NOTE — Progress Notes (Signed)
Patient ID: Nicholas Caldwell, male   DOB: 05/09/1946, 75 y.o.   MRN: [id number]     Advanced Heart Failure Rounding Note  PCP-Cardiologist: None   Subjective:    Feels "weak."  Afebrile.  Creatinine trending up 5.3 => 5.51.  He remains in NSR.    Objective:   Weight Range: 110.5 kg Body mass index is 33.04 kg/m.   Vital Signs:   Temp:  [97.6 F (36.4 C)-98.6 F (37 C)] 97.6 F (36.4 C) (05/11 0320) Pulse Rate:  [66-70] 68 (05/11 0320) Resp:  [18-20] 18 (05/11 0320) BP: (84-101)/(43-51) 101/48 (05/11 0320) SpO2:  [94 %-98 %] 95 % (05/11 0813) Weight:  [109.9 kg-110.5 kg] 110.5 kg (05/11 0642) Last BM Date: 03/09/21  Weight change: Filed Weights   03/12/21 0600 03/12/21 0900 03/13/21 0642  Weight: 109 kg 109.9 kg 110.5 kg    Intake/Output:   Intake/Output Summary (Last 24 hours) at 03/13/2021 0840 Last data filed at 03/13/2021 0300 Gross per 24 hour  Intake 1286.25 ml  Output 625 ml  Net 661.25 ml      Physical Exam    General:  Chronically ill-appearing HEENT: Normal Neck: Supple. JVP 8 cm. Carotids 2+ bilat; no bruits. No lymphadenopathy or thyromegaly appreciated. Cor: PMI nondisplaced. Regular rate & rhythm. No rubs, gallops or murmurs. Lungs: Clear Abdomen: Soft, nontender, nondistended. No hepatosplenomegaly. No bruits or masses. Good bowel sounds. Extremities: No cyanosis, clubbing, rash, edema Neuro: Alert & orientedx3, cranial nerves grossly intact. moves all 4 extremities w/o difficulty. Affect pleasant   Telemetry   NSR 60s (personally reviewed)  Labs    CBC Recent Labs    03/12/21 0357 03/12/21 1620 03/13/21 0316  WBC 5.8  --  6.4  HGB 8.0* 8.1* 8.6*  HCT 27.2* 27.1* 28.5*  MCV 86.1  --  85.8  PLT 405*  --  387*   Basic Metabolic Panel Recent Labs    03/12/21 0357 03/13/21 0316  NA 140 138  K 5.7* 5.2*  CL 103 103  CO2 26 23  GLUCOSE 100* 67*  BUN 82* 88*  CREATININE 5.31* 5.51*  CALCIUM 8.1* 8.3*  MG 2.5* 2.3  PHOS 7.2*  7.8*   Liver Function Tests Recent Labs    03/12/21 0357 03/13/21 0316  ALBUMIN 2.6* 2.6*   No results for input(s): LIPASE, AMYLASE in the last 72 hours. Cardiac Enzymes No results for input(s): CKTOTAL, CKMB, CKMBINDEX, TROPONINI in the last 72 hours.  BNP: BNP (last 3 results) Recent Labs    01/05/21 2213 01/26/21 2142 03/11/21 0746  BNP 3,522.2* 3,609.9* 2,676.5*    ProBNP (last 3 results) No results for input(s): PROBNP in the last 8760 hours.   D-Dimer No results for input(s): DDIMER in the last 72 hours. Hemoglobin A1C No results for input(s): HGBA1C in the last 72 hours. Fasting Lipid Panel No results for input(s): CHOL, HDL, LDLCALC, TRIG, CHOLHDL, LDLDIRECT in the last 72 hours. Thyroid Function Tests No results for input(s): TSH, T4TOTAL, T3FREE, THYROIDAB in the last 72 hours.  Invalid input(s): FREET3  Other results:   Imaging     No results found.   Medications:     Scheduled Medications: . allopurinol  100 mg Oral Daily  . amiodarone  200 mg Oral Daily  . apixaban  5 mg Oral BID  . atorvastatin  40 mg Oral Daily  . azithromycin  250 mg Oral QHS  . darbepoetin (ARANESP) injection - NON-DIALYSIS  150 mcg Subcutaneous Q Mon-1800  . ferrous  sulfate  325 mg Oral Q M,W,F-1800  . fluticasone  1 spray Each Nare QHS  . fluticasone furoate-vilanterol  1 puff Inhalation Daily   And  . umeclidinium bromide  1 puff Inhalation Daily  . gabapentin  300 mg Oral QHS  . insulin aspart  0-15 Units Subcutaneous TID WC  . insulin aspart protamine- aspart  5 Units Subcutaneous BID WC  . latanoprost  1 drop Both Eyes QHS  . midodrine  5 mg Oral TID WC  . pantoprazole  40 mg Oral BID  . sodium chloride flush  3 mL Intravenous Q12H  . sodium zirconium cyclosilicate  10 g Oral BID  . tamsulosin  0.4 mg Oral Daily     Infusions: . cefTRIAXone (ROCEPHIN)  IV Stopped (03/12/21 1826)     PRN Medications:  acetaminophen **OR** acetaminophen,  albuterol, carbamide peroxide, hydrocortisone, ipratropium-albuterol, oxyCODONE-acetaminophen **AND** oxyCODONE, polyethylene glycol, polyvinyl alcohol    Assessment/Plan   1. AKI on CKD stage 4: Creatinine up to 5.5 today.  SBP 80s-100s.  Off diuretics.  Possible ATN in setting of suspected sepsis syndrome with E coli UTI and possible PNA. - Nephrology following.  - I think that he would be a poor candidate for long-term HD with severe RV failure.  2. ID: PNA on CXR, UTI with E coli in urine.  - Ceftriaxone/azithromycin per primary team.  3. Chronic biventricular failure: Last echo in 2/22 with EF 45-50%, severe RV dysfunction.  Off diuretics now with AKI.  He does not look volume overloaded on exam.  - Hold diuretics.  - Midodrine 5 mg tid with soft BP, can titrate up if needed.  4. Atrial fibrillation: Paroxysmal.  He is in NSR.  - Continue amiodarone po. - Continue apixaban.   Poor long-term prognosis.  I do not think that he would tolerate HD well.  He has a history of poor compliance with outpatient management.  Poor insight.  Not interested in hospice/palliative care involvement at this time.  I will follow at a distance for now, do not have much additional to add. If renal function recovers, he will need to restart diuretics at some point in the future, I will be happy to readdress this when the time comes.   Length of Stay: 2  Loralie Champagne, MD  03/13/2021, 8:40 AM  Advanced Heart Failure Team Pager [phone] (M-F; 7a - 5p)  Please contact [facility] for night-coverage after hours (5p -7a ) and weekends on [email or web address]

## 2021-03-13 NOTE — Therapy (Signed)
Pt has home cpap unit on a with a [place] and pt refuses to take the [place] off and hook the oxygen up properly to the Cpap unit.

## 2021-03-13 NOTE — Care Management Important Message (Signed)
Important Message  Patient Details  Name: Nicholas Caldwell MRN: [id number] Date of Birth: February 22, 1946   Medicare Important Message Given:  Yes     Shelda Altes 03/13/2021, 10:49 AM

## 2021-03-13 NOTE — Progress Notes (Signed)
Inpatient Diabetes Program Recommendations  AACE/ADA: New Consensus Statement on Inpatient Glycemic Control (2015)  Target Ranges:  Prepandial:   less than 140 mg/dL      Peak postprandial:   less than 180 mg/dL (1-2 hours)      Critically ill patients:  140 - 180 mg/dL   Lab Results  Component Value Date   GLUCAP 146 (H) 03/13/2021   HGBA1C 6.8 (H) 01/28/2021    Review of Glycemic Control Results for Nicholas Caldwell, Nicholas Caldwell (MRN [id number]) as of 03/13/2021 13:46  Ref. Range 03/12/2021 06:02 03/12/2021 11:38 03/12/2021 16:41 03/12/2021 21:24 03/13/2021 06:25 03/13/2021 06:45 03/13/2021 12:32  Glucose-Capillary Latest Ref Range: 70 - 99 mg/dL 88 105 (H) 139 (H) Novolog 2 units 81 62 (L) 81 146 (H) Novolog 2 units   Diabetes history: DM2 Home Diabetes :70/30 10 units bid Current orders for Inpatient glycemic control: 70/30 5 units bid + Novolog 0-15 units tid  Inpatient Diabetes Program Recommendations:   -Decrease Novolog correction to 0-6 units tid  Noted hypoglycemia post Novolog correction. Secure chat sent to Dr. Darrick Meigs.  Thank you, Nani Gasser. Meliana Canner, RN, MSN, CDE  Diabetes Coordinator Inpatient Glycemic Control Team Team Pager [phone] (8am-5pm) 03/13/2021 1:57 PM

## 2021-03-14 DIAGNOSIS — I10 Essential (primary) hypertension: Secondary | ICD-10-CM | POA: Diagnosis not present

## 2021-03-14 DIAGNOSIS — J9611 Chronic respiratory failure with hypoxia: Secondary | ICD-10-CM | POA: Diagnosis not present

## 2021-03-14 DIAGNOSIS — I480 Paroxysmal atrial fibrillation: Secondary | ICD-10-CM | POA: Diagnosis not present

## 2021-03-14 DIAGNOSIS — N179 Acute kidney failure, unspecified: Secondary | ICD-10-CM | POA: Diagnosis not present

## 2021-03-14 LAB — RENAL FUNCTION PANEL
Albumin: 2.5 g/dL — ABNORMAL LOW (ref 3.5–5.0)
Anion gap: 12 (ref 5–15)
BUN: 94 mg/dL — ABNORMAL HIGH (ref 8–23)
CO2: 24 mmol/L (ref 22–32)
Calcium: 8.4 mg/dL — ABNORMAL LOW (ref 8.9–10.3)
Chloride: 102 mmol/L (ref 98–111)
Creatinine, Ser: 5.59 mg/dL — ABNORMAL HIGH (ref 0.61–1.24)
GFR, Estimated: 10 mL/min — ABNORMAL LOW (ref >60)
Glucose, Bld: 121 mg/dL — ABNORMAL HIGH (ref 70–99)
Phosphorus: 7.9 mg/dL — ABNORMAL HIGH (ref 2.5–4.6)
Potassium: 4.9 mmol/L (ref 3.5–5.1)
Sodium: 138 mmol/L (ref 135–145)

## 2021-03-14 LAB — GLUCOSE, CAPILLARY
Glucose-Capillary: 107 mg/dL — ABNORMAL HIGH (ref 70–99)
Glucose-Capillary: 149 mg/dL — ABNORMAL HIGH (ref 70–99)
Glucose-Capillary: 141 mg/dL — ABNORMAL HIGH (ref 70–99)
Glucose-Capillary: 116 mg/dL — ABNORMAL HIGH (ref 70–99)
Glucose-Capillary: 163 mg/dL — ABNORMAL HIGH (ref 70–99)

## 2021-03-14 MED ORDER — SODIUM ZIRCONIUM CYCLOSILICATE 10 G PO PACK
10.0000 g | PACK | Freq: Every day | ORAL | Status: DC
  Administered 2021-03-14 – 2021-03-16 (×3): 10 g via ORAL
  Filled 2021-03-14 (×3): qty 1

## 2021-03-14 NOTE — Evaluation (Signed)
Physical Therapy Evaluation Patient Details Name: Nicholas Caldwell MRN: [id number] DOB: 1946-01-16 Today's Date: 03/14/2021   History of Present Illness  Nicholas Caldwell is a 75 y.o. male with medical history significant of CHF with right ventricular dysfunction, anemia, CKD 3B, A. fib, CVA, chronic respiratory failure with hypoxia, COPD, CAD, diabetes, hypertension, hyperlipidemia, OSA who presents with multiple complaints including dizziness, dysuria, fevers, cough. workup for syncope; PNA, poss UTI, AKI  Clinical Impression  Pt demonstrates ability to perform bed mobility and sit>stand transfers without the need for physical assistance. Pt demonstrates limited activity tolerance secondary to SOB and dizziness, requiring multiple seated rest breaks. Pt ambulation limited during session due to requiring HFNC for oxygen supplementation. Pt requests prescription of exercises he can perform in the bed, pt education provided and exercises implemented. Pt will continue to benefit from acute PT to address deficits in strength, endurance, power, and activity tolerance.     Follow Up Recommendations Home health PT;Supervision for mobility/OOB    Equipment Recommendations  None recommended by PT    Recommendations for Other Services       Precautions / Restrictions Precautions Precautions: Fall;Other (comment) Precaution Comments: watch 02 and BP Restrictions Weight Bearing Restrictions: No      Mobility  Bed Mobility Overal bed mobility: Needs Assistance Bed Mobility: Supine to Sit;Sit to Supine     Supine to sit: Min guard;HOB elevated Sit to supine: Min assist (min A for LEs)        Transfers Overall transfer level: Needs assistance Equipment used: Rolling walker (2 wheeled);None Transfers: Sit to/from Stand Sit to Stand: Min guard         General transfer comment: Pt performs sit>stand transfers x 4 with seated rest breaks in between  Ambulation/Gait Ambulation/Gait  assistance: Min guard Gait Distance (Feet): 2 Feet (x 6 bouts) Assistive device: Rolling walker (2 wheeled) Gait Pattern/deviations: Step-to pattern;Decreased stride length Gait velocity: decreased Gait velocity interpretation: 1.31 - 2.62 ft/sec, indicative of limited community ambulator General Gait Details: Pt limited in gait distance due to placement on HFNC of 13 L.  Stairs            Wheelchair Mobility    Modified Rankin (Stroke Patients Only)       Balance Overall balance assessment: Needs assistance Sitting-balance support: Feet supported;Single extremity supported;Bilateral upper extremity supported Sitting balance-Leahy Scale: Poor Sitting balance - Comments: Pt unable to don socks, requires assistance.   Standing balance support: During functional activity;Bilateral upper extremity supported Standing balance-Leahy Scale: Poor Standing balance comment: Pt relies on UE support using RW with static and dynamic standing.                             Pertinent Vitals/Pain Pain Assessment: 0-10 Pain Score: 8  Pain Location: chronic back pain, R hip pain Pain Descriptors / Indicators: Grimacing;Sore Pain Intervention(s): Limited activity within patient's tolerance;Monitored during session    Home Living                        Prior Function                 Hand Dominance        Extremity/Trunk Assessment                Communication      Cognition Arousal/Alertness: Awake/alert Behavior During Therapy: WFL for tasks assessed/performed Overall Cognitive Status: Within Functional Limits for  tasks assessed                                        General Comments General comments (skin integrity, edema, etc.): Pt on 8 L [place] on arrival, nurse in room, desats to 85% during supine>sit. Pt placed on 10 L HFNC and continues to sat at 85%. Pt increased to 13 L HFNC with oxygen sat levels between 96-99%. Pt reports  dizziness during session with functional mobility. Bp taken in supine after pt reports dizziness- 113/49. Sitting- 113/53. After standing- 115/73.    Exercises     Assessment/Plan    PT Assessment    PT Problem List         PT Treatment Interventions      PT Goals (Current goals can be found in the Care Plan section)  Acute Rehab PT Goals Patient Stated Goal: Return home.    Frequency Min 3X/week   Barriers to discharge        Co-evaluation               AM-PAC PT "6 Clicks" Mobility  Outcome Measure Help needed turning from your back to your side while in a flat bed without using bedrails?: None Help needed moving from lying on your back to sitting on the side of a flat bed without using bedrails?: A Little Help needed moving to and from a bed to a chair (including a wheelchair)?: A Little Help needed standing up from a chair using your arms (e.g., wheelchair or bedside chair)?: A Little Help needed to walk in hospital room?: A Lot Help needed climbing 3-5 steps with a railing? : A Lot 6 Click Score: 17    End of Session Equipment Utilized During Treatment: Gait belt Activity Tolerance: Treatment limited secondary to medical complications (Comment) (oxygen sat levels, reports of dizziness.) Patient left: in bed;with call bell/phone within reach;with bed alarm set Nurse Communication: Mobility status PT Visit Diagnosis: Unsteadiness on feet (R26.81);Other abnormalities of gait and mobility (R26.89);Pain Pain - Right/Left: Right (back) Pain - part of body: Hip (back)    Time: 2409-7353 PT Time Calculation (min) (ACUTE ONLY): 36 min   Charges:     PT Treatments $Therapeutic Activity: 23-37 mins        Acute Rehab  Pager: [phone]   Garwin Brothers, SPT  03/14/2021, 12:43 PM

## 2021-03-14 NOTE — Progress Notes (Signed)
[facility]  PROGRESS NOTE  Nicholas Caldwell KCL:[id number] DOB: 1946-06-26 DOA: 03/14/2021 PCP: [name], MD   Brief HPI:   75 year old male with past medical history of biventricular heart failure, CKD stage IIIb, COPD on 4 L/min of oxygen, OSA not on CPAP, atrial fibrillation, CVA, CAD, diabetes mellitus type 2, hypertension, hyperlipidemia, chronic pain syndrome presents with dysuria, lightheadedness, fever productive cough for 2 days.  He was admitted for severe sepsis due to pneumonia and UTI with AKI.  Patient started on IV ceftriaxone, azithromycin.    Subjective   Patient seen and examined, denies chest pain or shortness of breath.   Assessment/Plan:     1. Acute kidney injury on CKD stage III-patient has baseline CKD from cardiorenal syndrome.  However at this time likely from hypotension along with use of diuretics.   Patient takes torsemide and metolazone at home.  Renal ultrasound showed no acute finding.  Creatinine is up to 5.51 today.  Diuretics are currently on hold.  Nephrology has seen the patient and deemed not a good candidate for hemodialysis.  As per nephrology overall systemic perfusion will be difficult to obtain if needed for any UF with HD at all.  Patient blood pressure has been low.  Palliative care was consulted, however the patient did not want to interact with palliative care.  Nephrologist had long discussion with the patient and explained to him that he would not be a good candidate for hemodialysis. 2. Community-acquired pneumonia-chest x-ray was concerning for left middle lobe and left lower lobe infiltrate.  Patient started on ceftriaxone and Zithromax. 3. UTI-complaint of dysuria.  UA showed pyuria.  Urine culture grew 40,000 colonies of E. coli sensitive to ceftriaxone.  Patient is currently on IV ceftriaxone. 4. Syncopal event/persistent hypotension-patient has persistent hypotension, had a syncopal event.  CT head/CT cervical spine showed no acute  finding.  Continue monitoring orthostatic vital signs.  Diuretics are on hold.  Cardiology has been consulted.  Patient started on midodrine 5 mg 3 times daily. 5. Biventricular heart failure-TTE in February 2022 with EF of 45 to 50%.  Severely enlarged RV.  He is on torsemide and metolazone at home.  Currently diuretics on hold.  Repeat echocardiogram shows EF of 50 to 49%, grade 2 diastolic dysfunction. 6. Paroxysmal atrial fibrillation-s/p DCCV in February 2022.  Heart rate controlled.  Continue amiodarone, Eliquis. 7. Chronic COPD/chronic hypoxemic respiratory failure-patient is on 4 L/min oxygen at baseline.  Continue home supplemental oxygen.  Breathing treatments as needed. 8. Diabetes mellitus type 2-CBG well controlled after sliding scale was switched to supersensitive sliding scale with NovoLog 0 to 6 units 3 times daily.      Scheduled medications:   . allopurinol  100 mg Oral Daily  . amiodarone  200 mg Oral Daily  . apixaban  5 mg Oral BID  . atorvastatin  40 mg Oral Daily  . azithromycin  250 mg Oral QHS  . darbepoetin (ARANESP) injection - NON-DIALYSIS  150 mcg Subcutaneous Q Mon-1800  . ferrous sulfate  325 mg Oral Q M,W,F-1800  . fluticasone  1 spray Each Nare QHS  . fluticasone furoate-vilanterol  1 puff Inhalation Daily   And  . umeclidinium bromide  1 puff Inhalation Daily  . gabapentin  300 mg Oral QHS  . insulin aspart  0-5 Units Subcutaneous QHS  . insulin aspart  0-6 Units Subcutaneous TID WC  . insulin aspart protamine- aspart  5 Units Subcutaneous BID WC  . latanoprost  1 drop  Both Eyes QHS  . midodrine  5 mg Oral TID WC  . pantoprazole  40 mg Oral BID  . sodium chloride flush  3 mL Intravenous Q12H  . sodium zirconium cyclosilicate  10 g Oral Daily  . tamsulosin  0.4 mg Oral Daily         Data Reviewed:   CBG:  Recent Labs  Lab 03/13/21 1535 03/13/21 2107 03/14/21 0634 03/14/21 1024 03/14/21 1132  GLUCAP 118* 110* 107* 149* 141*    SpO2:  96 % O2 Flow Rate (L/min): 8 L/min    Vitals:   03/14/21 0726 03/14/21 0910 03/14/21 1011 03/14/21 1135  BP:   (!) 108/54 101/64  Pulse:   70 71  Resp:   18 20  Temp:   97.7 F (36.5 C) 98.2 F (36.8 C)  TempSrc:   Oral Oral  SpO2: 91%  90% 96%  Weight:  112.3 kg       Intake/Output Summary (Last 24 hours) at 03/14/2021 1303 Last data filed at 03/14/2021 0911 Gross per 24 hour  Intake 240 ml  Output 300 ml  Net -60 ml    05/10 1901 - 05/12 0700 In: 720 [P.O.:720] Out: 1500 [Urine:1500]  Filed Weights   03/12/21 0900 03/13/21 0642 03/14/21 0910  Weight: 109.9 kg 110.5 kg 112.3 kg    CBC:  Recent Labs  Lab 03/13/2021 1619 03/11/21 0746 03/12/21 0357 03/12/21 1620 03/13/21 0316  WBC 6.2 6.1 5.8  --  6.4  HGB 8.4* 8.3* 8.0* 8.1* 8.6*  HCT 28.3* 27.8* 27.2* 27.1* 28.5*  PLT 448* 425* 405*  --  420*  MCV 85.8 85.3 86.1  --  85.8  MCH 25.5* 25.5* 25.3*  --  25.9*  MCHC 29.7* 29.9* 29.4*  --  30.2  RDW 19.8* 19.9* 19.9*  --  19.8*    Complete metabolic panel:  Recent Labs  Lab 03/13/2021 1619 03/11/21 0746 03/12/21 0357 03/12/21 1735 03/13/21 0316 03/14/21 0351  NA 140 140 140  --  138 138  K 5.2* 5.2* 5.7*  --  5.2* 4.9  CL 104 103 103  --  103 102  CO2 _0 --  23 24  GLUCOSE 53* 119* 100*  --  67* 121*  BUN 72* 76* 82*  --  88* 94*  CREATININE 4.48* 5.19* 5.31*  --  5.51* 5.59*  CALCIUM 8.3* 8.2* 8.1*  --  8.3* 8.4*  ALBUMIN  --   --  2.6*  --  2.6* 2.5*  MG  --  2.4 2.5*  --  2.3  --   PROCALCITON  --   --   --  0.48  --   --   BNP  --  2,676.5*  --   --   --   --     No results for input(s): LIPASE, AMYLASE in the last 168 hours.  Recent Labs  Lab 03/11/21 0008 03/11/21 0746 03/12/21 1735  BNP  --  0,052.5*  --   PROCALCITON  --   --  0.48  SARSCOV2NAA NEGATIVE  --   --     ------------------------------------------------------------------------------------------------------------------ No results for input(s): CHOL, HDL, LDLCALC,  TRIG, CHOLHDL, LDLDIRECT in the last 72 hours.  Lab Results  Component Value Date   HGBA1C 6.8 (H) 01/28/2021   ------------------------------------------------------------------------------------------------------------------ No results for input(s): TSH, T4TOTAL, T3FREE, THYROIDAB in the last 72 hours.  Invalid input(s): FREET3 ------------------------------------------------------------------------------------------------------------------ No results for input(s): VITAMINB12, FOLATE, FERRITIN, TIBC, IRON, RETICCTPCT in the last  72 hours.  Coagulation profile No results for input(s): INR, PROTIME in the last 168 hours. No results for input(s): DDIMER in the last 72 hours.  Cardiac Enzymes No results for input(s): CKTOTAL, CKMB, CKMBINDEX, TROPONINI in the last 168 hours.  ------------------------------------------------------------------------------------------------------------------    Component Value Date/Time   BNP 2,676.5 (H) 03/11/2021 0746   BNP 191.4 (H) 03/24/2016 1656     Antibiotics: Anti-infectives (From admission, onward)   Start     Dose/Rate Route Frequency Ordered Stop   03/12/21 2200  azithromycin (ZITHROMAX) tablet 250 mg        250 mg Oral Daily at bedtime 03/12/21 0854 03/15/21 2159   03/11/21 2000  azithromycin (ZITHROMAX) 250 mg in dextrose 5 % 125 mL IVPB  Status:  Discontinued       "Followed by" Linked Group Details   250 mg 125 mL/hr over 60 Minutes Intravenous Every 24 hours 03/20/2021 1828 03/12/21 0854   03/07/2021 1830  azithromycin (ZITHROMAX) 500 mg in sodium chloride 0.9 % 250 mL IVPB       "Followed by" Linked Group Details   500 mg 250 mL/hr over 60 Minutes Intravenous  Once 03/13/2021 1828 03/14/2021 2056   03/09/2021 1700  cefTRIAXone (ROCEPHIN) 1 g in sodium chloride 0.9 % 100 mL IVPB        1 g 200 mL/hr over 30 Minutes Intravenous Every 24 hours 03/08/2021 1659 03/15/21 2359   03/19/2021 1700  doxycycline (VIBRA-TABS) tablet 100 mg  Status:   Discontinued        100 mg Oral Every 12 hours 03/26/2021 1659 03/21/2021 1828       Radiology Reports  ECHOCARDIOGRAM COMPLETE  Result Date: 03/13/2021    ECHOCARDIOGRAM REPORT   Patient Name:   Nicholas Caldwell Date of Exam: 03/13/2021 Medical Rec #:  [id number]    Height:       72.0 in Accession #:    [id number]   Weight:       243.6 lb Date of Birth:  [birth year]    BSA:          2.317 m Patient Age:    73 years     BP:           101/48 mmHg Patient Gender: M            HR:           68 bpm. Exam Location:  Inpatient Procedure: 2D Echo, Cardiac Doppler and Color Doppler Indications:    Syncope  History:        Patient has prior history of Echocardiogram examinations, most                 recent 09/26/2020. CAD, COPD, Arrythmias:Atrial Flutter; Risk                 Factors:Hypertension, Diabetes, Dyslipidemia and Former Smoker.  Sonographer:    Cammy Brochure Referring Phys: [facility]  Sonographer Comments: Suboptimal apical window, patient is morbidly obese and suboptimal subcostal window. Image acquisition challenging due to patient body habitus and Image acquisition challenging due to COPD. IMPRESSIONS  1. Left ventricular ejection fraction, by estimation, is 50 to 55%. The left ventricle has low normal function. The left ventricle has no regional wall motion abnormalities. Left ventricular diastolic parameters are consistent with Grade II diastolic dysfunction (pseudonormalization).  2. Peak RV-RA gradient 53 mmHg. Right ventricular systolic function is severely reduced. The right ventricular size is severely enlarged. D-shaped interventricular septum suggestive of  RV pressure/volume overload.  3. Left atrial size was mildly dilated.  4. Right atrial size was severely dilated.  5. The mitral valve is normal in structure. No evidence of mitral valve regurgitation. No evidence of mitral stenosis.  6. Tricuspid valve regurgitation is moderate.  7. The aortic valve is tricuspid. Aortic valve regurgitation is  trivial. No aortic stenosis is present.  8. The IVC was not visualized. FINDINGS  Left Ventricle: Left ventricular ejection fraction, by estimation, is 50 to 55%. The left ventricle has low normal function. The left ventricle has no regional wall motion abnormalities. The left ventricular internal cavity size was normal in size. There is no left ventricular hypertrophy. Left ventricular diastolic parameters are consistent with Grade II diastolic dysfunction (pseudonormalization). Right Ventricle: Peak RV-RA gradient 53 mmHg. The right ventricular size is severely enlarged. No increase in right ventricular wall thickness. Right ventricular systolic function is severely reduced. Left Atrium: Left atrial size was mildly dilated. Right Atrium: Right atrial size was severely dilated. Pericardium: Trivial pericardial effusion is present. Mitral Valve: The mitral valve is normal in structure. No evidence of mitral valve regurgitation. No evidence of mitral valve stenosis. Tricuspid Valve: The tricuspid valve is normal in structure. Tricuspid valve regurgitation is moderate. Aortic Valve: The aortic valve is tricuspid. Aortic valve regurgitation is trivial. No aortic stenosis is present. Aortic valve mean gradient measures 3.0 mmHg. Aortic valve peak gradient measures 5.0 mmHg. Aortic valve area, by VTI measures 2.38 cm. Pulmonic Valve: The pulmonic valve was not well visualized. Pulmonic valve regurgitation is not visualized. Aorta: The aortic root is normal in size and structure. IAS/Shunts: No atrial level shunt detected by color flow Doppler.  LEFT VENTRICLE PLAX 2D LVIDd:         4.80 cm  Diastology LVIDs:         2.90 cm  LV e' medial:    4.53 cm/s LV PW:         1.50 cm  LV E/e' medial:  13.8 LV IVS:        0.90 cm  LV e' lateral:   7.08 cm/s LVOT diam:     2.00 cm  LV E/e' lateral: 8.8 LV SV:         46 LV SV Index:   20 LVOT Area:     3.14 cm  RIGHT VENTRICLE RV Basal diam:  6.70 cm RV S prime:     10.00 cm/s  TAPSE (M-mode): 1.5 cm LEFT ATRIUM             Index       RIGHT ATRIUM           Index LA diam:        3.90 cm 1.68 cm/m  RA Area:     32.60 cm LA Vol (A2C):   93.7 ml 40.44 ml/m RA Volume:   130.00 ml 56.11 ml/m LA Vol (A4C):   67.4 ml 29.09 ml/m LA Biplane Vol: 78.0 ml 33.67 ml/m  AORTIC VALVE AV Area (Vmax):    2.22 cm AV Area (Vmean):   2.01 cm AV Area (VTI):     2.38 cm AV Vmax:           112.00 cm/s AV Vmean:          79.500 cm/s AV VTI:            0.195 m AV Peak Grad:      5.0 mmHg AV Mean Grad:  3.0 mmHg LVOT Vmax:         79.10 cm/s LVOT Vmean:        50.800 cm/s LVOT VTI:          0.148 m LVOT/AV VTI ratio: 0.76 MITRAL VALVE               TRICUSPID VALVE MV Area (PHT): 4.15 cm    TR Peak grad:   53.3 mmHg MV Decel Time: 183 msec    TR Vmax:        365.00 cm/s MV E velocity: 62.60 cm/s MV A velocity: 48.00 cm/s  SHUNTS MV E/A ratio:  1.30        Systemic VTI:  0.15 m                            Systemic Diam: 2.00 cm Loralie Champagne MD Electronically signed by Loralie Champagne MD Signature Date/Time: 03/13/2021/1:16:09 PM    Final       DVT prophylaxis: Apixaban  Code Status: Full code  Family Communication: No family at bedside   Consultants:  Cardiology  Nephrology  Palliative care  Procedures:      Objective    Physical Examination:  General-appears in no acute distress Heart-S1-S2, regular, no murmur auscultated Lungs-decreased breath sounds at lung bases Abdomen-soft, nontender, no organomegaly Extremities-no edema in the lower extremities Neuro-alert, oriented x3, no focal deficit noted  Status is: Inpatient  Dispo: The patient is from: Home              Anticipated d/c is to: Home              Anticipated d/c date is: 03/16/2021              Patient currently not stable for discharge  Barrier to discharge-ongoing treatment for CHF, acute kidney injury  COVID-19 Labs  No results for input(s): DDIMER, FERRITIN, LDH, CRP in the last 72  hours.  Lab Results  Component Value Date   [place] NEGATIVE 03/11/2021   [place] NEGATIVE 01/26/2021   [place] NEGATIVE 01/06/2021   [place] NEGATIVE 11/21/2020    Microbiology  Recent Results (from the past 240 hour(s))  Urine culture     Status: Abnormal   Collection Time: 03/06/2021  7:06 PM   Specimen: Urine, Clean Catch  Result Value Ref Range Status   Specimen Description URINE, CLEAN CATCH  Final   Special Requests   Final    NONE Performed at [facility], [place] [address], [place], [place] [address]    Culture 40,000 COLONIES/mL ESCHERICHIA COLI (A)  Final   Report Status 03/13/2021 FINAL  Final   Organism ID, Bacteria ESCHERICHIA COLI (A)  Final      Susceptibility   Escherichia coli - MIC*    AMPICILLIN <=2 SENSITIVE Sensitive     CEFAZOLIN <=4 SENSITIVE Sensitive     CEFEPIME <=0.12 SENSITIVE Sensitive     CEFTRIAXONE <=0.25 SENSITIVE Sensitive     CIPROFLOXACIN <=0.25 SENSITIVE Sensitive     GENTAMICIN <=1 SENSITIVE Sensitive     IMIPENEM <=0.25 SENSITIVE Sensitive     NITROFURANTOIN <=16 SENSITIVE Sensitive     TRIMETH/SULFA <=20 SENSITIVE Sensitive     AMPICILLIN/SULBACTAM <=2 SENSITIVE Sensitive     PIP/TAZO <=4 SENSITIVE Sensitive     * 40,000 COLONIES/mL ESCHERICHIA COLI  Resp Panel by RT-PCR (Flu A&B, Covid) Nasopharyngeal Swab     Status: None   Collection  Time: 03/11/21 12:08 AM   Specimen: Nasopharyngeal Swab; Nasopharyngeal(NP) swabs in vial transport medium  Result Value Ref Range Status   SARS Coronavirus 2 by RT PCR NEGATIVE NEGATIVE Final    Comment: (NOTE) SARS-CoV-2 target nucleic acids are NOT DETECTED.  The SARS-CoV-2 RNA is generally detectable in upper respiratory specimens during the acute phase of infection. The lowest concentration of SARS-CoV-2 viral copies this assay can detect is 138 copies/mL. A negative result does not preclude SARS-Cov-2 infection and should not be used as the sole basis for treatment  or other patient management decisions. A negative result may occur with  improper specimen collection/handling, submission of specimen other than nasopharyngeal swab, presence of viral mutation(s) within the areas targeted by this assay, and inadequate number of viral copies(<138 copies/mL). A negative result must be combined with clinical observations, patient history, and epidemiological information. The expected result is Negative.  Fact Sheet for Patients:  EntrepreneurPulse.com.au  Fact Sheet for Healthcare Providers:  IncredibleEmployment.be  This test is no t yet approved or cleared by the [place] FDA and  has been authorized for detection and/or diagnosis of SARS-CoV-2 by FDA under an Emergency Use Authorization (EUA). This EUA will remain  in effect (meaning this test can be used) for the duration of the COVID-19 declaration under Section 564(b)(1) of the Act, 21 U.S.C.section 360bbb-3(b)(1), unless the authorization is terminated  or revoked sooner.       Influenza A by PCR NEGATIVE NEGATIVE Final   Influenza B by PCR NEGATIVE NEGATIVE Final    Comment: (NOTE) The Xpert Xpress SARS-CoV-2/FLU/RSV plus assay is intended as an aid in the diagnosis of influenza from Nasopharyngeal swab specimens and should not be used as a sole basis for treatment. Nasal washings and aspirates are unacceptable for Xpert Xpress SARS-CoV-2/FLU/RSV testing.  Fact Sheet for Patients: EntrepreneurPulse.com.au  Fact Sheet for Healthcare Providers: IncredibleEmployment.be  This test is not yet approved or cleared by the [place] FDA and has been authorized for detection and/or diagnosis of SARS-CoV-2 by FDA under an Emergency Use Authorization (EUA). This EUA will remain in effect (meaning this test can be used) for the duration of the COVID-19 declaration under Section 564(b)(1) of the Act, 21 U.S.C. section  360bbb-3(b)(1), unless the authorization is terminated or revoked.  Performed at [facility], [place] [address] , [place] [address]     Pressure Injury 01/27/21 Buttocks Left;Mid Stage 2 -  Partial thickness loss of dermis presenting as a shallow open injury with a red, pink wound bed without slough. pink 0.5 cm x 0.5 cm (Active)  01/27/21 0100  Location: Buttocks  Location Orientation: Left;Mid  Staging: Stage 2 -  Partial thickness loss of dermis presenting as a shallow open injury with a red, pink wound bed without slough.  Wound Description (Comments): pink 0.5 cm x 0.5 cm  Present on Admission: Yes     Pressure Injury 03/11/21 Coccyx Mid Stage 2 -  Partial thickness loss of dermis presenting as a shallow open injury with a red, pink wound bed without slough. (Active)  03/11/21 0140  Location: Coccyx  Location Orientation: Mid  Staging: Stage 2 -  Partial thickness loss of dermis presenting as a shallow open injury with a red, pink wound bed without slough.  Wound Description (Comments):   Present on Admission: Yes          [place]   [facility] If 7PM-7AM, please contact night-coverage at [email or web address], Office  [phone]  03/14/2021, 1:03 PM  LOS: 3 days

## 2021-03-14 NOTE — Progress Notes (Signed)
Report given to [facility], Therapist, sports. Patient is 97 % on non-rebreather with non-labored breathing, A&Ox4. Patient reports his nose is still stuffy.  Discussed with night shift nurse about transitioning patient to venti mask. RT also coming to see patient.

## 2021-03-14 NOTE — Progress Notes (Signed)
Sent Dr. Darrick Meigs a secure chat and let MD know that patient's o2 sats are around 81% on wall high flow nasal cannula at 12 L and is complaining of his nose being stuffy and unable to breath out of his nose. Let MD know that RN is placing patient on a non-re breather. MD acknowledged and gave no new orders. Patient A&O with no respiratory distress.  O2 sats now 96% on non-re breather.

## 2021-03-14 NOTE — Progress Notes (Signed)
Subjective:  BP soft again overnight-   1250 of UOP recorded which was an improvement-  K better but kidney function overall a little worse.  He is alert, feels OK.  He refused conversation with palliative care.  [name] saw- did not have anything to offer and agrees that dialysis is not a good option for Nicholas Caldwell- started midodrine-  He is feeling OK this AM -  Off bipap  Objective Vital signs in last 24 hours: Vitals:   03/13/21 2209 03/14/21 0401 03/14/21 0726 03/14/21 0910  BP:  96/66    Pulse:  62    Resp: 16 16    Temp:  98.2 F (36.8 C)    TempSrc:  Axillary    SpO2:  99% 91%   Weight:    112.3 kg   Weight change:   Intake/Output Summary (Last 24 hours) at 03/14/2021 9753 Last data filed at 03/14/2021 0911 Gross per 24 hour  Intake 480 ml  Output 1250 ml  Net -770 ml    Assessment/Plan: 75 year old BM with severe biventricular heart failure, COPD and CKD-  Failure to thrive of sorts with this being the 6th hospitalization in 2022.   Now with repeated A on CRF 1.Renal- A on CRF.  Has baseline severe CKD thought due to cardiorenal syndrome.  This time is different in that he is not volume overloaded- in fact diuretics have been put on hold given that he is orthostatic and he was getting ivf.  He does not appear to be uremic -  I  agree with his primary nephrologist Dr. Marval Regal that dialysis is not a good option here-  Would not make him any less medically complex and overall systemic perfusion would be difficult to attain if he needed any UF with HD at all.  His low BP the last 24 hours strengthens this argument.  I talked to him again today about how serious this situation is, reiterated that I dont think his heart is strong enough for dialysis but we are not to a crisis point yet.  He is having a tough time accepting.  Will continue to attempt to manage medically and hope that renal function will turn around.  He continues to have poor insight says he is counting on me to save  him 2. Hypertension/volume  - right now does not seem overloaded but that is how he usually is.  I agreed with management initially to hold diuretics and to hydrate but need to be careful given his history-  I have stopped IVF.  There do not seem to be any options to increase BP and increase perfusion per [name].   Started on midodrine but BP still low 3. ID-  uti and possible PNA-  rocephin and azithro-  He feels better 4. Uric acid of over 11-  Likely is not helping-  Allopurinol added 5. Anemia  - due to frequent hospitalizations and CKD-  have added ESA-  Avoid iv iron given current infections 6.  Hyperkalemia-   giving lokelma-  Will dec to once daily  7.  Consider palliative care involement -  he refuses.. Encouraged him to participate in PT/OT get stronger   Louis Meckel    Labs: Basic Metabolic Panel: Recent Labs  Lab 03/12/21 0357 03/13/21 0316 03/14/21 0351  NA 140 138 138  K 5.7* 5.2* 4.9  CL 103 103 102  CO2 _0 GLUCOSE 100* 67* 121*  BUN 82* 88* 94*  CREATININE 5.31* 5.51* 5.59*  CALCIUM 8.1* 8.3* 8.4*  PHOS 7.2* 7.8* 7.9*   Liver Function Tests: Recent Labs  Lab 03/12/21 0357 03/13/21 0316 03/14/21 0351  ALBUMIN 2.6* 2.6* 2.5*   No results for input(s): LIPASE, AMYLASE in the last 168 hours. No results for input(s): AMMONIA in the last 168 hours. CBC: Recent Labs  Lab 03/12/2021 1619 03/11/21 0746 03/12/21 0357 03/12/21 1620 03/13/21 0316  WBC 6.2 6.1 5.8  --  6.4  HGB 8.4* 8.3* 8.0* 8.1* 8.6*  HCT 28.3* 27.8* 27.2* 27.1* 28.5*  MCV 85.8 85.3 86.1  --  85.8  PLT 448* 425* 405*  --  420*   Cardiac Enzymes: No results for input(s): CKTOTAL, CKMB, CKMBINDEX, TROPONINI in the last 168 hours. CBG: Recent Labs  Lab 03/13/21 0645 03/13/21 1232 03/13/21 1535 03/13/21 2107 03/14/21 0634  GLUCAP 81 146* 118* 110* 107*    Iron Studies: No results for input(s): IRON, TIBC, TRANSFERRIN, FERRITIN in the last 72  hours. Studies/Results: ECHOCARDIOGRAM COMPLETE  Result Date: 03/13/2021    ECHOCARDIOGRAM REPORT   Patient Name:   Nicholas Caldwell Date of Exam: 03/13/2021 Medical Rec #:  [id number]    Height:       72.0 in Accession #:    [id number]   Weight:       243.6 lb Date of Birth:  06-04-1946    BSA:          2.317 m Patient Age:    7 years     BP:           101/48 mmHg Patient Gender: M            HR:           68 bpm. Exam Location:  Inpatient Procedure: 2D Echo, Cardiac Doppler and Color Doppler Indications:    Syncope  History:        Patient has prior history of Echocardiogram examinations, most                 recent 09/26/2020. CAD, COPD, Arrythmias:Atrial Flutter; Risk                 Factors:Hypertension, Diabetes, Dyslipidemia and Former Smoker.  Sonographer:    Cammy Brochure Referring Phys: Mercy Riding  Sonographer Comments: Suboptimal apical window, patient is morbidly obese and suboptimal subcostal window. Image acquisition challenging due to patient body habitus and Image acquisition challenging due to COPD. IMPRESSIONS  1. Left ventricular ejection fraction, by estimation, is 50 to 55%. The left ventricle has low normal function. The left ventricle has no regional wall motion abnormalities. Left ventricular diastolic parameters are consistent with Grade II diastolic dysfunction (pseudonormalization).  2. Peak RV-RA gradient 53 mmHg. Right ventricular systolic function is severely reduced. The right ventricular size is severely enlarged. D-shaped interventricular septum suggestive of RV pressure/volume overload.  3. Left atrial size was mildly dilated.  4. Right atrial size was severely dilated.  5. The mitral valve is normal in structure. No evidence of mitral valve regurgitation. No evidence of mitral stenosis.  6. Tricuspid valve regurgitation is moderate.  7. The aortic valve is tricuspid. Aortic valve regurgitation is trivial. No aortic stenosis is present.  8. The IVC was not visualized. FINDINGS   Left Ventricle: Left ventricular ejection fraction, by estimation, is 50 to 55%. The left ventricle has low normal function. The left ventricle has no regional wall motion abnormalities. The left ventricular internal cavity size was normal in size. There  is no left ventricular hypertrophy. Left ventricular diastolic parameters are consistent with Grade II diastolic dysfunction (pseudonormalization). Right Ventricle: Peak RV-RA gradient 53 mmHg. The right ventricular size is severely enlarged. No increase in right ventricular wall thickness. Right ventricular systolic function is severely reduced. Left Atrium: Left atrial size was mildly dilated. Right Atrium: Right atrial size was severely dilated. Pericardium: Trivial pericardial effusion is present. Mitral Valve: The mitral valve is normal in structure. No evidence of mitral valve regurgitation. No evidence of mitral valve stenosis. Tricuspid Valve: The tricuspid valve is normal in structure. Tricuspid valve regurgitation is moderate. Aortic Valve: The aortic valve is tricuspid. Aortic valve regurgitation is trivial. No aortic stenosis is present. Aortic valve mean gradient measures 3.0 mmHg. Aortic valve peak gradient measures 5.0 mmHg. Aortic valve area, by VTI measures 2.38 cm. Pulmonic Valve: The pulmonic valve was not well visualized. Pulmonic valve regurgitation is not visualized. Aorta: The aortic root is normal in size and structure. IAS/Shunts: No atrial level shunt detected by color flow Doppler.  LEFT VENTRICLE PLAX 2D LVIDd:         4.80 cm  Diastology LVIDs:         2.90 cm  LV e' medial:    4.53 cm/s LV PW:         1.50 cm  LV E/e' medial:  13.8 LV IVS:        0.90 cm  LV e' lateral:   7.08 cm/s LVOT diam:     2.00 cm  LV E/e' lateral: 8.8 LV SV:         46 LV SV Index:   20 LVOT Area:     3.14 cm  RIGHT VENTRICLE RV Basal diam:  6.70 cm RV S prime:     10.00 cm/s TAPSE (M-mode): 1.5 cm LEFT ATRIUM             Index       RIGHT ATRIUM            Index LA diam:        3.90 cm 1.68 cm/m  RA Area:     32.60 cm LA Vol (A2C):   93.7 ml 40.44 ml/m RA Volume:   130.00 ml 56.11 ml/m LA Vol (A4C):   67.4 ml 29.09 ml/m LA Biplane Vol: 78.0 ml 33.67 ml/m  AORTIC VALVE AV Area (Vmax):    2.22 cm AV Area (Vmean):   2.01 cm AV Area (VTI):     2.38 cm AV Vmax:           112.00 cm/s AV Vmean:          79.500 cm/s AV VTI:            0.195 m AV Peak Grad:      5.0 mmHg AV Mean Grad:      3.0 mmHg LVOT Vmax:         79.10 cm/s LVOT Vmean:        50.800 cm/s LVOT VTI:          0.148 m LVOT/AV VTI ratio: 0.76 MITRAL VALVE               TRICUSPID VALVE MV Area (PHT): 4.15 cm    TR Peak grad:   53.3 mmHg MV Decel Time: 183 msec    TR Vmax:        365.00 cm/s MV E velocity: 62.60 cm/s MV A velocity: 48.00 cm/s  SHUNTS MV E/A ratio:  1.30  Systemic VTI:  0.15 m                            Systemic Diam: 2.00 cm Loralie Champagne MD Electronically signed by Loralie Champagne MD Signature Date/Time: 03/13/2021/1:16:09 PM    Final    Medications: Infusions: . cefTRIAXone (ROCEPHIN)  IV 1 g (03/13/21 1733)    Scheduled Medications: . allopurinol  100 mg Oral Daily  . amiodarone  200 mg Oral Daily  . apixaban  5 mg Oral BID  . atorvastatin  40 mg Oral Daily  . azithromycin  250 mg Oral QHS  . darbepoetin (ARANESP) injection - NON-DIALYSIS  150 mcg Subcutaneous Q Mon-1800  . ferrous sulfate  325 mg Oral Q M,W,F-1800  . fluticasone  1 spray Each Nare QHS  . fluticasone furoate-vilanterol  1 puff Inhalation Daily   And  . umeclidinium bromide  1 puff Inhalation Daily  . gabapentin  300 mg Oral QHS  . insulin aspart  0-5 Units Subcutaneous QHS  . insulin aspart  0-6 Units Subcutaneous TID WC  . insulin aspart protamine- aspart  5 Units Subcutaneous BID WC  . latanoprost  1 drop Both Eyes QHS  . midodrine  5 mg Oral TID WC  . pantoprazole  40 mg Oral BID  . sodium chloride flush  3 mL Intravenous Q12H  . sodium zirconium cyclosilicate  10 g Oral BID  .  tamsulosin  0.4 mg Oral Daily    have reviewed scheduled and prn medications.  Physical Exam: General: alert, NAD Heart: RRR Lungs: dec BS at bases-  Big O2 req Abdomen: obese, soft Extremities: min edema    03/14/2021,9:27 AM  LOS: 3 days

## 2021-03-15 ENCOUNTER — Ambulatory Visit: Payer: Self-pay

## 2021-03-15 DIAGNOSIS — N179 Acute kidney failure, unspecified: Secondary | ICD-10-CM | POA: Diagnosis not present

## 2021-03-15 DIAGNOSIS — J9611 Chronic respiratory failure with hypoxia: Secondary | ICD-10-CM | POA: Diagnosis not present

## 2021-03-15 DIAGNOSIS — I10 Essential (primary) hypertension: Secondary | ICD-10-CM | POA: Diagnosis not present

## 2021-03-15 DIAGNOSIS — I480 Paroxysmal atrial fibrillation: Secondary | ICD-10-CM | POA: Diagnosis not present

## 2021-03-15 LAB — GLUCOSE, CAPILLARY
Glucose-Capillary: 160 mg/dL — ABNORMAL HIGH (ref 70–99)
Glucose-Capillary: 142 mg/dL — ABNORMAL HIGH (ref 70–99)
Glucose-Capillary: 138 mg/dL — ABNORMAL HIGH (ref 70–99)
Glucose-Capillary: 109 mg/dL — ABNORMAL HIGH (ref 70–99)

## 2021-03-15 LAB — RENAL FUNCTION PANEL
Albumin: 2.7 g/dL — ABNORMAL LOW (ref 3.5–5.0)
Anion gap: 11 (ref 5–15)
BUN: 95 mg/dL — ABNORMAL HIGH (ref 8–23)
CO2: 25 mmol/L (ref 22–32)
Calcium: 8.4 mg/dL — ABNORMAL LOW (ref 8.9–10.3)
Chloride: 102 mmol/L (ref 98–111)
Creatinine, Ser: 5.69 mg/dL — ABNORMAL HIGH (ref 0.61–1.24)
GFR, Estimated: 10 mL/min — ABNORMAL LOW (ref >60)
Glucose, Bld: 164 mg/dL — ABNORMAL HIGH (ref 70–99)
Phosphorus: 8.9 mg/dL — ABNORMAL HIGH (ref 2.5–4.6)
Potassium: 5.2 mmol/L — ABNORMAL HIGH (ref 3.5–5.1)
Sodium: 138 mmol/L (ref 135–145)

## 2021-03-15 MED ORDER — ACETAMINOPHEN 10 MG/ML IV SOLN
1000.0000 mg | Freq: Once | INTRAVENOUS | Status: AC
  Administered 2021-03-16: 1000 mg via INTRAVENOUS
  Filled 2021-03-15: qty 100

## 2021-03-15 MED ORDER — ONDANSETRON HCL 4 MG/2ML IJ SOLN: 4.0000 mg | Freq: Four times a day (QID) | INTRAMUSCULAR | Status: DC | PRN

## 2021-03-15 NOTE — Progress Notes (Addendum)
[facility]  PROGRESS NOTE  Nicholas Caldwell UUV:[id number] DOB: November 14, 1945 DOA: 03/21/2021 PCP: [name], MD   Brief HPI:   75 year old male with past medical history of biventricular heart failure, CKD stage IIIb, COPD on 4 L/min of oxygen, OSA not on CPAP, atrial fibrillation, CVA, CAD, diabetes mellitus type 2, hypertension, hyperlipidemia, chronic pain syndrome presents with dysuria, lightheadedness, fever productive cough for 2 days.  He was admitted for severe sepsis due to pneumonia and UTI with AKI.  Patient started on IV ceftriaxone, azithromycin.    Subjective   Patient seen and examined, developed labored breathing last night on 2 L/min high flow nasal cannula.  Had to be placed on 100% nonrebreather for a while.  Currently on HFNC at 10 L/min.  Denies shortness of breath.  Somnolent and jerking of muscles noted.   Assessment/Plan:     1. Acute kidney injury on CKD stage III-patient has baseline CKD from cardiorenal syndrome.  However at this time likely from hypotension along with use of diuretics.   Patient takes torsemide and metolazone at home.  Renal ultrasound showed no acute finding.  Creatinine is up to 5.51 today.  Diuretics are currently on hold.  Nephrology has seen the patient and deemed not a good candidate for hemodialysis.  As per nephrology overall systemic perfusion will be difficult to obtain if needed for any UF with HD at all.  Patient blood pressure has been low.  Palliative care was consulted, however the patient did not want to interact with palliative care.  Nephrologist had long discussion with the patient and explained to him that he would not be a good candidate for hemodialysis.  2. Somnolence/myoclonus-patient has developed  muscle jerking, BUN is 95 today.?  Developing uremia.  Not a dialysis candidate per nephrology.  Continue to monitor.  Nephrology following.  We will hold as needed oxycodone.  3. Community-acquired pneumonia-chest x-ray was  concerning for left middle lobe and left lower lobe infiltrate.  Patient started on ceftriaxone and Zithromax.  Last day of antibiotics today.  4. UTI-complaint of dysuria.  UA showed pyuria.  Urine culture grew 40,000 colonies of E. coli sensitive to ceftriaxone.  Treated with IV ceftriaxone as above.  5. Syncopal event/persistent hypotension-patient has persistent hypotension, had a syncopal event.  CT head/CT cervical spine showed no acute finding.  Continue monitoring orthostatic vital signs.  Diuretics are on hold.  Cardiology has been consulted.  Patient started on midodrine 5 mg 3 times daily.  6. Biventricular heart failure-TTE in February 2022 with EF of 45 to 50%.  Severely enlarged RV.  He is on torsemide and metolazone at home.  Currently diuretics on hold.  Repeat echocardiogram shows EF of 50 to 47%, grade 2 diastolic dysfunction.  7. Paroxysmal atrial fibrillation-s/p DCCV in February 2022.  Heart rate controlled.  Continue amiodarone, Eliquis.  8. Chronic COPD/chronic hypoxemic respiratory failure-patient is on 4 L/min oxygen at baseline.  Continue home supplemental oxygen.  DuoNebs as needed.  9. Diabetes mellitus type 2-CBG well controlled after sliding scale was switched to supersensitive sliding scale with NovoLog 0 to 6 units 3 times daily.      Scheduled medications:   . allopurinol  100 mg Oral Daily  . amiodarone  200 mg Oral Daily  . apixaban  5 mg Oral BID  . atorvastatin  40 mg Oral Daily  . darbepoetin (ARANESP) injection - NON-DIALYSIS  150 mcg Subcutaneous Q Mon-1800  . ferrous sulfate  325 mg Oral Q M,W,F-1800  .  fluticasone  1 spray Each Nare QHS  . fluticasone furoate-vilanterol  1 puff Inhalation Daily   And  . umeclidinium bromide  1 puff Inhalation Daily  . gabapentin  300 mg Oral QHS  . insulin aspart  0-5 Units Subcutaneous QHS  . insulin aspart  0-6 Units Subcutaneous TID WC  . insulin aspart protamine- aspart  5 Units Subcutaneous BID WC  .  latanoprost  1 drop Both Eyes QHS  . midodrine  5 mg Oral TID WC  . pantoprazole  40 mg Oral BID  . sodium chloride flush  3 mL Intravenous Q12H  . sodium zirconium cyclosilicate  10 g Oral Daily  . tamsulosin  0.4 mg Oral Daily         Data Reviewed:   CBG:  Recent Labs  Lab 03/14/21 1024 03/14/21 1132 03/14/21 1620 03/14/21 2103 03/15/21 0554  GLUCAP 149* 141* 116* 163* 160*    SpO2: 94 % O2 Flow Rate (L/min): 10 L/min    Vitals:   03/15/21 0552 03/15/21 0555 03/15/21 0737 03/15/21 0911  BP: (!) 118/54   (!) 113/51  Pulse: 82   74  Resp: 20     Temp:      TempSrc:      SpO2: 92%  94% 94%  Weight:  113.3 kg       Intake/Output Summary (Last 24 hours) at 03/15/2021 0938 Last data filed at 03/15/2021 0600 Gross per 24 hour  Intake 720 ml  Output 650 ml  Net 70 ml    05/11 1901 - 05/13 0700 In: 960 [P.O.:960] Out: 850 [Urine:850]  Filed Weights   03/13/21 0642 03/14/21 0910 03/15/21 0555  Weight: 110.5 kg 112.3 kg 113.3 kg    CBC:  Recent Labs  Lab 03/18/2021 1619 03/11/21 0746 03/12/21 0357 03/12/21 1620 03/13/21 0316  WBC 6.2 6.1 5.8  --  6.4  HGB 8.4* 8.3* 8.0* 8.1* 8.6*  HCT 28.3* 27.8* 27.2* 27.1* 28.5*  PLT 448* 425* 405*  --  420*  MCV 85.8 85.3 86.1  --  85.8  MCH 25.5* 25.5* 25.3*  --  25.9*  MCHC 29.7* 29.9* 29.4*  --  30.2  RDW 19.8* 19.9* 19.9*  --  19.8*    Complete metabolic panel:  Recent Labs  Lab 03/11/21 0746 03/12/21 0357 03/12/21 1735 03/13/21 0316 03/14/21 0351 03/15/21 0258  NA 140 140  --  138 138 138  K 5.2* 5.7*  --  5.2* 4.9 5.2*  CL 103 103  --  103 102 102  CO2 27 26  --  _0 GLUCOSE 119* 100*  --  67* 121* 164*  BUN 76* 82*  --  88* 94* 95*  CREATININE 5.19* 5.31*  --  5.51* 5.59* 5.69*  CALCIUM 8.2* 8.1*  --  8.3* 8.4* 8.4*  ALBUMIN  --  2.6*  --  2.6* 2.5* 2.7*  MG 2.4 2.5*  --  2.3  --   --   PROCALCITON  --   --  0.48  --   --   --   BNP 2,676.5*  --   --   --   --   --     No  results for input(s): LIPASE, AMYLASE in the last 168 hours.  Recent Labs  Lab 03/11/21 0008 03/11/21 0746 03/12/21 1735  BNP  --  8,469.6*  --   PROCALCITON  --   --  0.48  SARSCOV2NAA NEGATIVE  --   --     ------------------------------------------------------------------------------------------------------------------  No results for input(s): CHOL, HDL, LDLCALC, TRIG, CHOLHDL, LDLDIRECT in the last 72 hours.  Lab Results  Component Value Date   HGBA1C 6.8 (H) 01/28/2021   ------------------------------------------------------------------------------------------------------------------ No results for input(s): TSH, T4TOTAL, T3FREE, THYROIDAB in the last 72 hours.  Invalid input(s): FREET3 ------------------------------------------------------------------------------------------------------------------ No results for input(s): VITAMINB12, FOLATE, FERRITIN, TIBC, IRON, RETICCTPCT in the last 72 hours.  Coagulation profile No results for input(s): INR, PROTIME in the last 168 hours. No results for input(s): DDIMER in the last 72 hours.  Cardiac Enzymes No results for input(s): CKTOTAL, CKMB, CKMBINDEX, TROPONINI in the last 168 hours.  ------------------------------------------------------------------------------------------------------------------    Component Value Date/Time   BNP 2,676.5 (H) 03/11/2021 0746   BNP 191.4 (H) 03/24/2016 1656     Antibiotics: Anti-infectives (From admission, onward)   Start     Dose/Rate Route Frequency Ordered Stop   03/12/21 2200  azithromycin (ZITHROMAX) tablet 250 mg        250 mg Oral Daily at bedtime 03/12/21 0854 03/14/21 2051   03/11/21 2000  azithromycin (ZITHROMAX) 250 mg in dextrose 5 % 125 mL IVPB  Status:  Discontinued       "Followed by" Linked Group Details   250 mg 125 mL/hr over 60 Minutes Intravenous Every 24 hours 03/21/2021 1828 03/12/21 0854   03/31/2021 1830  azithromycin (ZITHROMAX) 500 mg in sodium chloride 0.9 % 250  mL IVPB       "Followed by" Linked Group Details   500 mg 250 mL/hr over 60 Minutes Intravenous  Once 03/07/2021 1828 03/30/2021 2056   03/13/2021 1700  cefTRIAXone (ROCEPHIN) 1 g in sodium chloride 0.9 % 100 mL IVPB        1 g 200 mL/hr over 30 Minutes Intravenous Every 24 hours 03/07/2021 1659 03/15/21 2359   03/31/2021 1700  doxycycline (VIBRA-TABS) tablet 100 mg  Status:  Discontinued        100 mg Oral Every 12 hours 04/02/2021 1659 03/31/2021 1828       Radiology Reports  ECHOCARDIOGRAM COMPLETE  Result Date: 03/13/2021    ECHOCARDIOGRAM REPORT   Patient Name:   Nicholas Caldwell Date of Exam: 03/13/2021 Medical Rec #:  [id number]    Height:       72.0 in Accession #:    [id number]   Weight:       243.6 lb Date of Birth:  28-Jun-1946    BSA:          2.317 m Patient Age:    59 years     BP:           101/48 mmHg Patient Gender: M            HR:           68 bpm. Exam Location:  Inpatient Procedure: 2D Echo, Cardiac Doppler and Color Doppler Indications:    Syncope  History:        Patient has prior history of Echocardiogram examinations, most                 recent 09/26/2020. CAD, COPD, Arrythmias:Atrial Flutter; Risk                 Factors:Hypertension, Diabetes, Dyslipidemia and Former Smoker.  Sonographer:    Cammy Brochure Referring Phys: [facility] Riding  Sonographer Comments: Suboptimal apical window, patient is morbidly obese and suboptimal subcostal window. Image acquisition challenging due to patient body habitus and Image acquisition challenging due to COPD. IMPRESSIONS  1. Left ventricular  ejection fraction, by estimation, is 50 to 55%. The left ventricle has low normal function. The left ventricle has no regional wall motion abnormalities. Left ventricular diastolic parameters are consistent with Grade II diastolic dysfunction (pseudonormalization).  2. Peak RV-RA gradient 53 mmHg. Right ventricular systolic function is severely reduced. The right ventricular size is severely enlarged. D-shaped  interventricular septum suggestive of RV pressure/volume overload.  3. Left atrial size was mildly dilated.  4. Right atrial size was severely dilated.  5. The mitral valve is normal in structure. No evidence of mitral valve regurgitation. No evidence of mitral stenosis.  6. Tricuspid valve regurgitation is moderate.  7. The aortic valve is tricuspid. Aortic valve regurgitation is trivial. No aortic stenosis is present.  8. The IVC was not visualized. FINDINGS  Left Ventricle: Left ventricular ejection fraction, by estimation, is 50 to 55%. The left ventricle has low normal function. The left ventricle has no regional wall motion abnormalities. The left ventricular internal cavity size was normal in size. There is no left ventricular hypertrophy. Left ventricular diastolic parameters are consistent with Grade II diastolic dysfunction (pseudonormalization). Right Ventricle: Peak RV-RA gradient 53 mmHg. The right ventricular size is severely enlarged. No increase in right ventricular wall thickness. Right ventricular systolic function is severely reduced. Left Atrium: Left atrial size was mildly dilated. Right Atrium: Right atrial size was severely dilated. Pericardium: Trivial pericardial effusion is present. Mitral Valve: The mitral valve is normal in structure. No evidence of mitral valve regurgitation. No evidence of mitral valve stenosis. Tricuspid Valve: The tricuspid valve is normal in structure. Tricuspid valve regurgitation is moderate. Aortic Valve: The aortic valve is tricuspid. Aortic valve regurgitation is trivial. No aortic stenosis is present. Aortic valve mean gradient measures 3.0 mmHg. Aortic valve peak gradient measures 5.0 mmHg. Aortic valve area, by VTI measures 2.38 cm. Pulmonic Valve: The pulmonic valve was not well visualized. Pulmonic valve regurgitation is not visualized. Aorta: The aortic root is normal in size and structure. IAS/Shunts: No atrial level shunt detected by color flow Doppler.   LEFT VENTRICLE PLAX 2D LVIDd:         4.80 cm  Diastology LVIDs:         2.90 cm  LV e' medial:    4.53 cm/s LV PW:         1.50 cm  LV E/e' medial:  13.8 LV IVS:        0.90 cm  LV e' lateral:   7.08 cm/s LVOT diam:     2.00 cm  LV E/e' lateral: 8.8 LV SV:         46 LV SV Index:   20 LVOT Area:     3.14 cm  RIGHT VENTRICLE RV Basal diam:  6.70 cm RV S prime:     10.00 cm/s TAPSE (M-mode): 1.5 cm LEFT ATRIUM             Index       RIGHT ATRIUM           Index LA diam:        3.90 cm 1.68 cm/m  RA Area:     32.60 cm LA Vol (A2C):   93.7 ml 40.44 ml/m RA Volume:   130.00 ml 56.11 ml/m LA Vol (A4C):   67.4 ml 29.09 ml/m LA Biplane Vol: 78.0 ml 33.67 ml/m  AORTIC VALVE AV Area (Vmax):    2.22 cm AV Area (Vmean):   2.01 cm AV Area (VTI):  2.38 cm AV Vmax:           112.00 cm/s AV Vmean:          79.500 cm/s AV VTI:            0.195 m AV Peak Grad:      5.0 mmHg AV Mean Grad:      3.0 mmHg LVOT Vmax:         79.10 cm/s LVOT Vmean:        50.800 cm/s LVOT VTI:          0.148 m LVOT/AV VTI ratio: 0.76 MITRAL VALVE               TRICUSPID VALVE MV Area (PHT): 4.15 cm    TR Peak grad:   53.3 mmHg MV Decel Time: 183 msec    TR Vmax:        365.00 cm/s MV E velocity: 62.60 cm/s MV A velocity: 48.00 cm/s  SHUNTS MV E/A ratio:  1.30        Systemic VTI:  0.15 m                            Systemic Diam: 2.00 cm Loralie Champagne MD Electronically signed by Loralie Champagne MD Signature Date/Time: 03/13/2021/1:16:09 PM    Final       DVT prophylaxis: Apixaban  Code Status: Full code  Family Communication: No family at bedside   Consultants:  Cardiology  Nephrology  Palliative care  Procedures:      Objective    Physical Examination:  General-appears in no acute distress, involuntary muscle jerking noted Heart-S1-S2, regular, no murmur auscultated Lungs-clear to auscultation bilaterally, no wheezing or crackles auscultated Abdomen-soft, nontender, no organomegaly Extremities-no edema in  the lower extremities Neuro-somnolent, arousable on verbal stimuli  Status is: Inpatient  Dispo: The patient is from: Home              Anticipated d/c is to: Home              Anticipated d/c date is: 03/16/2021              Patient currently not stable for discharge  Barrier to discharge-ongoing treatment for CHF, acute kidney injury  COVID-19 Labs  No results for input(s): DDIMER, FERRITIN, LDH, CRP in the last 72 hours.  Lab Results  Component Value Date   [employer] NEGATIVE 03/11/2021   [place] NEGATIVE 01/26/2021   [place] NEGATIVE 01/06/2021   [place] NEGATIVE 11/21/2020    Microbiology  Recent Results (from the past 240 hour(s))  Urine culture     Status: Abnormal   Collection Time: 03/12/2021  7:06 PM   Specimen: Urine, Clean Catch  Result Value Ref Range Status   Specimen Description URINE, CLEAN CATCH  Final   Special Requests   Final    NONE Performed at [facility], [address], [place], [place] [address]    Culture 40,000 COLONIES/mL ESCHERICHIA COLI (A)  Final   Report Status 03/13/2021 FINAL  Final   Organism ID, Bacteria ESCHERICHIA COLI (A)  Final      Susceptibility   Escherichia coli - MIC*    AMPICILLIN <=2 SENSITIVE Sensitive     CEFAZOLIN <=4 SENSITIVE Sensitive     CEFEPIME <=0.12 SENSITIVE Sensitive     CEFTRIAXONE <=0.25 SENSITIVE Sensitive     CIPROFLOXACIN <=0.25 SENSITIVE Sensitive     GENTAMICIN <=1 SENSITIVE Sensitive  IMIPENEM <=0.25 SENSITIVE Sensitive     NITROFURANTOIN <=16 SENSITIVE Sensitive     TRIMETH/SULFA <=20 SENSITIVE Sensitive     AMPICILLIN/SULBACTAM <=2 SENSITIVE Sensitive     PIP/TAZO <=4 SENSITIVE Sensitive     * 40,000 COLONIES/mL ESCHERICHIA COLI  Resp Panel by RT-PCR (Flu A&B, Covid) Nasopharyngeal Swab     Status: None   Collection Time: 03/11/21 12:08 AM   Specimen: Nasopharyngeal Swab; Nasopharyngeal(NP) swabs in vial transport medium  Result Value Ref Range Status   SARS Coronavirus  2 by RT PCR NEGATIVE NEGATIVE Final    Comment: (NOTE) SARS-CoV-2 target nucleic acids are NOT DETECTED.  The SARS-CoV-2 RNA is generally detectable in upper respiratory specimens during the acute phase of infection. The lowest concentration of SARS-CoV-2 viral copies this assay can detect is 138 copies/mL. A negative result does not preclude SARS-Cov-2 infection and should not be used as the sole basis for treatment or other patient management decisions. A negative result may occur with  improper specimen collection/handling, submission of specimen other than nasopharyngeal swab, presence of viral mutation(s) within the areas targeted by this assay, and inadequate number of viral copies(<138 copies/mL). A negative result must be combined with clinical observations, patient history, and epidemiological information. The expected result is Negative.  Fact Sheet for Patients:  EntrepreneurPulse.com.au  Fact Sheet for Healthcare Providers:  IncredibleEmployment.be  This test is no t yet approved or cleared by the [place] FDA and  has been authorized for detection and/or diagnosis of SARS-CoV-2 by FDA under an Emergency Use Authorization (EUA). This EUA will remain  in effect (meaning this test can be used) for the duration of the COVID-19 declaration under Section 564(b)(1) of the Act, 21 U.S.C.section 360bbb-3(b)(1), unless the authorization is terminated  or revoked sooner.       Influenza A by PCR NEGATIVE NEGATIVE Final   Influenza B by PCR NEGATIVE NEGATIVE Final    Comment: (NOTE) The Xpert Xpress SARS-CoV-2/FLU/RSV plus assay is intended as an aid in the diagnosis of influenza from Nasopharyngeal swab specimens and should not be used as a sole basis for treatment. Nasal washings and aspirates are unacceptable for Xpert Xpress SARS-CoV-2/FLU/RSV testing.  Fact Sheet for Patients: EntrepreneurPulse.com.au  Fact  Sheet for Healthcare Providers: IncredibleEmployment.be  This test is not yet approved or cleared by the [place] FDA and has been authorized for detection and/or diagnosis of SARS-CoV-2 by FDA under an Emergency Use Authorization (EUA). This EUA will remain in effect (meaning this test can be used) for the duration of the COVID-19 declaration under Section 564(b)(1) of the Act, 21 U.S.C. section 360bbb-3(b)(1), unless the authorization is terminated or revoked.  Performed at [facility], [place] [address], , [place] [address]     Pressure Injury 01/27/21 Buttocks Left;Mid Stage 2 -  Partial thickness loss of dermis presenting as a shallow open injury with a red, pink wound bed without slough. pink 0.5 cm x 0.5 cm (Active)  01/27/21 0100  Location: Buttocks  Location Orientation: Left;Mid  Staging: Stage 2 -  Partial thickness loss of dermis presenting as a shallow open injury with a red, pink wound bed without slough.  Wound Description (Comments): pink 0.5 cm x 0.5 cm  Present on Admission: Yes     Pressure Injury 03/11/21 Coccyx Mid Stage 2 -  Partial thickness loss of dermis presenting as a shallow open injury with a red, pink wound bed without slough. (Active)  03/11/21 0140  Location: Coccyx  Location  Orientation: Mid  Staging: Stage 2 -  Partial thickness loss of dermis presenting as a shallow open injury with a red, pink wound bed without slough.  Wound Description (Comments):   Present on Admission: Yes          [place]   [facility] If 7PM-7AM, please contact night-coverage at [email or web address], Office  [phone]   03/15/2021, 9:38 AM  LOS: 4 days

## 2021-03-15 NOTE — Progress Notes (Signed)
Subjective:  BP seems better-   650 of UOP recorded which is less-  K better but kidney function overall a little worse but not majorly different the last 48 hours.  He says he is woozy this AM-  There is notation of worsening SOB, worsening somnolence -- wife at bedside currently    Objective Vital signs in last 24 hours: Vitals:   03/15/21 0737 03/15/21 0911 03/15/21 1126 03/15/21 1127  BP:  (!) 113/51  (!) 103/51  Pulse:  74  61  Resp:      Temp:   98.4 F (36.9 C)   TempSrc:   Oral   SpO2: 94% 94%  98%  Weight:       Weight change:   Intake/Output Summary (Last 24 hours) at 03/15/2021 1147 Last data filed at 03/15/2021 0600 Gross per 24 hour  Intake 720 ml  Output 650 ml  Net 70 ml    Assessment/Plan: 75 year old BM with severe biventricular heart failure, COPD and CKD-  Failure to thrive of sorts with this being the 6th hospitalization in 2022.   Now with repeated A on CRF 1.Renal- A on CRF.  Has baseline severe CKD thought due to cardiorenal syndrome.  This time hosp is different in that he is not volume overloaded- in fact diuretics have been put on hold given that he is orthostatic and he was getting ivf.  He does not appear to be uremic -  I  agree with his primary nephrologist Dr. Marval Regal that dialysis is not a good option here-  Would not make him any less medically complex and overall systemic perfusion would be difficult to attain if he needed any UF with HD at all. I have talked to him about how serious this situation is, reiterated that I dont think his heart is strong enough for dialysis.  He is having a tough time accepting but has made progress.  Will continue to attempt to manage medically and hope that renal function will turn around but I am not hopeful.  He may now be having some uremic sxms.  I do not see him bouncing back.  I have asked him to reconsider palliative care involvement-  He agrees-  I think we need a strategy to stabilize him-  Possibly to SNF to avoid  further acute hospitalizations 2. Hypertension/volume  - right now maybe overloaded.  I agreed with management initially to hold diuretics and to hydrate but stopped IVF early on.  There do not seem to be any options to increase BP and increase perfusion per [name].   Started on midodrine -  Worried that diuretics will drop BP further 3. ID-  uti and possible PNA-  rocephin and azithro-  He feels better 4. Uric acid of over 11-  Likely is not helping-  Allopurinol added 5. Anemia  - due to frequent hospitalizations and CKD-  have added ESA-  Avoid iv iron given current infections 6.  Hyperkalemia-   giving lokelma-   dec to once daily yesterday-  Will continue  7.  Consider palliative care involement -  he refused initially. I talked to he and his wife today and they are more open.  I left a message on the pall line   Louis Meckel    Labs: Basic Metabolic Panel: Recent Labs  Lab 03/13/21 0316 03/14/21 0351 03/15/21 0258  NA 138 138 138  K 5.2* 4.9 5.2*  CL 103 102 102  CO2 23 24  25  GLUCOSE 67* 121* 164*  BUN 88* 94* 95*  CREATININE 5.51* 5.59* 5.69*  CALCIUM 8.3* 8.4* 8.4*  PHOS 7.8* 7.9* 8.9*   Liver Function Tests: Recent Labs  Lab 03/13/21 0316 03/14/21 0351 03/15/21 0258  ALBUMIN 2.6* 2.5* 2.7*   No results for input(s): LIPASE, AMYLASE in the last 168 hours. No results for input(s): AMMONIA in the last 168 hours. CBC: Recent Labs  Lab 03/21/2021 1619 03/11/21 0746 03/12/21 0357 03/12/21 1620 03/13/21 0316  WBC 6.2 6.1 5.8  --  6.4  HGB 8.4* 8.3* 8.0* 8.1* 8.6*  HCT 28.3* 27.8* 27.2* 27.1* 28.5*  MCV 85.8 85.3 86.1  --  85.8  PLT 448* 425* 405*  --  420*   Cardiac Enzymes: No results for input(s): CKTOTAL, CKMB, CKMBINDEX, TROPONINI in the last 168 hours. CBG: Recent Labs  Lab 03/14/21 1132 03/14/21 1620 03/14/21 2103 03/15/21 0554 03/15/21 1129  GLUCAP 141* 116* 163* 160* 142*    Iron Studies: No results for input(s): IRON, TIBC,  TRANSFERRIN, FERRITIN in the last 72 hours. Studies/Results: No results found. Medications: Infusions: . cefTRIAXone (ROCEPHIN)  IV 1 g (03/14/21 1828)    Scheduled Medications: . allopurinol  100 mg Oral Daily  . amiodarone  200 mg Oral Daily  . apixaban  5 mg Oral BID  . atorvastatin  40 mg Oral Daily  . darbepoetin (ARANESP) injection - NON-DIALYSIS  150 mcg Subcutaneous Q Mon-1800  . ferrous sulfate  325 mg Oral Q M,W,F-1800  . fluticasone  1 spray Each Nare QHS  . fluticasone furoate-vilanterol  1 puff Inhalation Daily   And  . umeclidinium bromide  1 puff Inhalation Daily  . gabapentin  300 mg Oral QHS  . insulin aspart  0-5 Units Subcutaneous QHS  . insulin aspart  0-6 Units Subcutaneous TID WC  . insulin aspart protamine- aspart  5 Units Subcutaneous BID WC  . latanoprost  1 drop Both Eyes QHS  . midodrine  5 mg Oral TID WC  . pantoprazole  40 mg Oral BID  . sodium chloride flush  3 mL Intravenous Q12H  . sodium zirconium cyclosilicate  10 g Oral Daily  . tamsulosin  0.4 mg Oral Daily    have reviewed scheduled and prn medications.  Physical Exam: General: somnolent but arousable Heart: RRR Lungs: dec BS at bases-  Big O2 req Abdomen: obese, soft Extremities: min edema    03/15/2021,11:47 AM  LOS: 4 days

## 2021-03-16 DIAGNOSIS — Z66 Do not resuscitate: Secondary | ICD-10-CM | POA: Diagnosis not present

## 2021-03-16 DIAGNOSIS — N179 Acute kidney failure, unspecified: Secondary | ICD-10-CM | POA: Diagnosis not present

## 2021-03-16 DIAGNOSIS — Z7189 Other specified counseling: Secondary | ICD-10-CM

## 2021-03-16 DIAGNOSIS — J9611 Chronic respiratory failure with hypoxia: Secondary | ICD-10-CM | POA: Diagnosis not present

## 2021-03-16 DIAGNOSIS — Z515 Encounter for palliative care: Secondary | ICD-10-CM | POA: Diagnosis not present

## 2021-03-16 DIAGNOSIS — I10 Essential (primary) hypertension: Secondary | ICD-10-CM | POA: Diagnosis not present

## 2021-03-16 DIAGNOSIS — I480 Paroxysmal atrial fibrillation: Secondary | ICD-10-CM | POA: Diagnosis not present

## 2021-03-16 DIAGNOSIS — N170 Acute kidney failure with tubular necrosis: Secondary | ICD-10-CM

## 2021-03-16 LAB — RENAL FUNCTION PANEL
Albumin: 2.5 g/dL — ABNORMAL LOW (ref 3.5–5.0)
Anion gap: 12 (ref 5–15)
BUN: 99 mg/dL — ABNORMAL HIGH (ref 8–23)
CO2: 25 mmol/L (ref 22–32)
Calcium: 8.5 mg/dL — ABNORMAL LOW (ref 8.9–10.3)
Chloride: 102 mmol/L (ref 98–111)
Creatinine, Ser: 5.46 mg/dL — ABNORMAL HIGH (ref 0.61–1.24)
GFR, Estimated: 10 mL/min — ABNORMAL LOW (ref >60)
Glucose, Bld: 105 mg/dL — ABNORMAL HIGH (ref 70–99)
Phosphorus: 8.9 mg/dL — ABNORMAL HIGH (ref 2.5–4.6)
Potassium: 4.6 mmol/L (ref 3.5–5.1)
Sodium: 139 mmol/L (ref 135–145)

## 2021-03-16 LAB — GLUCOSE, CAPILLARY
Glucose-Capillary: 102 mg/dL — ABNORMAL HIGH (ref 70–99)
Glucose-Capillary: 93 mg/dL (ref 70–99)

## 2021-03-16 MED ORDER — FENTANYL CITRATE (PF) 100 MCG/2ML IJ SOLN: 50.0000 ug | INTRAMUSCULAR | Status: DC | PRN

## 2021-03-16 MED ORDER — ONDANSETRON 4 MG PO TBDP
4.0000 mg | ORAL_TABLET | Freq: Four times a day (QID) | ORAL | Status: DC | PRN
  Filled 2021-03-16: qty 1

## 2021-03-16 MED ORDER — FENTANYL CITRATE (PF) 100 MCG/2ML IJ SOLN: 25.0000 ug | INTRAMUSCULAR | Status: DC | PRN

## 2021-03-16 MED ORDER — LORAZEPAM 2 MG/ML IJ SOLN
0.5000 mg | INTRAMUSCULAR | Status: DC | PRN
Start: 2021-03-16 — End: 2021-03-18

## 2021-03-16 MED ORDER — ACETAMINOPHEN 325 MG PO TABS: 650.0000 mg | ORAL_TABLET | Freq: Four times a day (QID) | ORAL | Status: DC | PRN

## 2021-03-16 MED ORDER — GLYCOPYRROLATE 0.2 MG/ML IJ SOLN: 0.4000 mg | INTRAMUSCULAR | Status: DC | PRN

## 2021-03-16 MED ORDER — OXYCODONE-ACETAMINOPHEN 7.5-325 MG PO TABS
1.0000 | ORAL_TABLET | Freq: Once | ORAL | Status: AC
Start: 2021-03-16 — End: 2021-03-16
  Administered 2021-03-16: 1 via ORAL
  Filled 2021-03-16: qty 1

## 2021-03-16 MED ORDER — POLYVINYL ALCOHOL 1.4 % OP SOLN
1.0000 [drp] | Freq: Four times a day (QID) | OPHTHALMIC | Status: DC | PRN
  Filled 2021-03-16: qty 15

## 2021-03-16 MED ORDER — OXYCODONE-ACETAMINOPHEN 7.5-325 MG PO TABS
2.0000 | ORAL_TABLET | ORAL | Status: DC | PRN
  Administered 2021-03-16: 2 via ORAL
  Filled 2021-03-16: qty 2

## 2021-03-16 MED ORDER — HYDROMORPHONE HCL 1 MG/ML IJ SOLN
1.0000 mg | INTRAMUSCULAR | Status: DC | PRN
  Administered 2021-03-16: 1 mg via INTRAVENOUS
  Filled 2021-03-16: qty 1

## 2021-03-16 MED ORDER — BIOTENE DRY MOUTH MT LIQD: 15.0000 mL | OROMUCOSAL | Status: DC | PRN

## 2021-03-16 MED ORDER — OXYCODONE-ACETAMINOPHEN 7.5-325 MG PO TABS
1.0000 | ORAL_TABLET | ORAL | Status: DC | PRN
  Administered 2021-03-17: 1 via ORAL
  Filled 2021-03-16: qty 1

## 2021-03-16 MED ORDER — ONDANSETRON HCL 4 MG/2ML IJ SOLN: 4.0000 mg | Freq: Four times a day (QID) | INTRAMUSCULAR | Status: DC | PRN

## 2021-03-16 MED ORDER — FENTANYL CITRATE (PF) 100 MCG/2ML IJ SOLN
25.0000 ug | INTRAMUSCULAR | Status: DC | PRN
  Administered 2021-03-16 (×2): 25 ug via INTRAVENOUS
  Filled 2021-03-16 (×2): qty 2

## 2021-03-16 MED ORDER — ACETAMINOPHEN 650 MG RE SUPP: 650.0000 mg | Freq: Four times a day (QID) | RECTAL | Status: DC | PRN

## 2021-03-16 NOTE — Progress Notes (Signed)
Manufacturing engineer [facility]) Hospital Liaison note.    Received request from [place] for family interest in [facility]. Chart and pt information under review by [facility] physician.  Hospice eligibility pending at this time.  Attempted to call pt's spouse at [phone] without success.  Liaison team will continue to reach out.  [facility] is unable to offer a room today. Hospital Liaison will follow up tomorrow or sooner if a room becomes available. Please do not hesitate to call with questions.    Thank you for the opportunity to participate in this patient's care.  Domenic Moras, BSN, RN [facility] Care Liaison (listed on [place] under Hospice[facility])    [phone] (24h on call)

## 2021-03-16 NOTE — Progress Notes (Signed)
Palliative Medicine Inpatient Follow Up Note   Reason for consult:  Goals of Care "Goals of care- family now willing to talk  with palliative team"  HPI:  Per intake H&P --> Nicholas Caldwell is a 75 year old male with past medical history of biventricular heart failure (LVEF 50-55%), CKD stage IIIb, COPD on 4 L/min of oxygen, OSA not on CPAP, atrial fibrillation, CVA, CAD, diabetes mellitus type 2, hypertension, hyperlipidemia, chronic pain syndrome presents with dysuria, lightheadedness, fever productive cough for 2 days.  He was admitted for severe sepsis due to pneumonia and UTI with AKI.  Patient started on IV ceftriaxone, azithromycin.  Angela has been seen by the [facility] team ten times in the past year and a half. He has been hospitalized seven times in the past six months with an additional one ER visit in the setting of cardio-renal syndrome.  Rashidi has been admitted for five days and is now PMT was consulted on 5/10 and patient was not interested in speaking with the team at that time.  Aren is now in a situation where he is in renal failure not in the setting of volume overload. Nephrology has deemed that he is not a strong dialysis candidate d/t frailty and have requested palliative care to aid in goals of care conversations.   Today's Discussion (03/16/2021):  *Please note that this is a verbal dictation therefore any spelling or grammatical errors are due to the "[place]" system interpretation.   I met with Felicity Pellegrini to further discuss diagnosis prognosis, GOC, EOL wishes, disposition and options.   I introduced Palliative Medicine as specialized medical care for people living with serious illness. It focuses on providing relief from the symptoms and stress of a serious illness. The goal is to improve quality of life for both the patient and the family.  A brief life review was completed: Nicholas Caldwell has lived in [place] for 30 years and has  been married to [place] for 25 years. They share one daughter, [name], and 1 granddaughter. He has worked for [employer] and in [employer] in the past. He does not do much for enjoyment besides watching TV. Being at home with his family is very important to him. He is a Psychologist, forensic.  Patient with severely limited his functioning for the past 5 years. He is only able to walk a short distance before feeling dyspneic, approximately from the hospital bed to the door of patient's room. His condition has been this way for the entire 5 years. He gives himself sponge baths over the areas he can reach. His wife does the cooking and cleaning and she does not currently work. He has home health equipment and mentions needing an ambulance for transportation upon discharge.   Reviewed [name] COPD, biventricular heart failure, and kidney disease which is evolving into renal failure.  Reviewed that [name] is getting encephalopathic in the setting of his kidney failure.  Discussed that he is not a candidate for dialysis.  Discussed what happens when organ start to shut down from kidney failure inclusive of cardiac arrest.  Concepts specific to code status, artifical feeding and hydration, continued IV antibiotics and rehospitalization was had.   The difference between a aggressive medical intervention path  and a palliative comfort care path for this patient at this time was had. Values and goals of care important to patient and family were attempted to be elicited.  [name] was very tearful though she shared that she does  not want to see her husband suffer any longer and if we were to resuscitate him that would cause some additional suffering.  Decided to make Nicholas Rinks DO NOT RESUSCITATE DO NOT INTUBATE CODE STATUS.  I shared with [name] that we are having an end-of-life care discussion given Nicholas Caldwell's decline over the past week.We talked about transition to comfort measures in house and what that would entail inclusive of  medications to control pain, dyspnea, agitation, nausea, itching, and hiccups.   We discussed stopping all uneccessary measures such as CPAP, blood draws, glucose needle sticks, and frequent vital signs.   Patrician I further reviewed that Nicholas Caldwell will likely need placement at a facility.  Discussed inpatient hospice and what they are able to offer which would be continuous care for patients who are at the end of life.  Reviewed symptom management.  [name] shares with me that she is in agreement and would like to proceed with comfort care.  Patient's medical nursing team team made aware and orders placed.  Decision Maker: [name] (Spouse) [phone]  SUMMARY OF RECOMMENDATIONS   DNAR/DNI  Comfort care -right now does not have an indication for a fentanyl drip though this could be needed going forward  Liberalize visitation policy  Medications per [facility]  TOC - Appreciate consult to transition to [place] support - Patients wife requests prayer  Ongoing incremental palliative support _______________________________ Objective Assessment: Vital Signs Vitals:   03/16/21 0800 03/16/21 1056  BP:  (!) 107/53  Pulse:  71  Resp:  17  Temp:  97.9 F (36.6 C)  SpO2: (!) 88% 100%    Intake/Output Summary (Last 24 hours) at 03/16/2021 1506 Last data filed at 03/16/2021 1322 Gross per 24 hour  Intake 420 ml  Output 850 ml  Net -430 ml   Last Weight  Most recent update: 03/16/2021  1:35 AM   Weight  112.2 kg (247 lb 5.7 oz)           Gen: Elderly ill appearing M in respiratory distress HEENT: Dry mucous membranes CV: Regular rate and rhythm  PULM: On 15LPM [place] ABD: soft/nontender/nondistended  EXT: No edema  Neuro: Disoriented aware of self though not situation  Discussed with Dr. Darrick Meigs  Time Spent: 60 Greater than 50% of the time was spent in counseling and coordination of  care ______________________________________________________________________________________ [facility] Team Team Cell Phone: [phone] Please utilize secure chat with additional questions, if there is no response within 30 minutes please call the above phone number  Palliative Medicine Team providers are available by phone from 7am to 7pm daily and can be reached through the team cell phone.  Should this patient require assistance outside of these hours, please call the patient's attending physician.

## 2021-03-16 NOTE — Progress Notes (Signed)
[facility]  PROGRESS NOTE  Nicholas Caldwell VQM:[id number] DOB: June 11, 1946 DOA: 03/23/2021 PCP: [name], MD   Brief HPI:   75 year old male with past medical history of biventricular heart failure, CKD stage IIIb, COPD on 4 L/min of oxygen, OSA not on CPAP, atrial fibrillation, CVA, CAD, diabetes mellitus type 2, hypertension, hyperlipidemia, chronic pain syndrome presents with dysuria, lightheadedness, fever productive cough for 2 days.  He was admitted for severe sepsis due to pneumonia and UTI with AKI.  Patient started on IV ceftriaxone, azithromycin.    Subjective   Patient seen and examined, continues to complains of shortness of breath.  He was made DNR after discussion with patient's wife yesterday.  Palliative care has been consulted again for goals of care discussion.   Assessment/Plan:     1. Acute kidney injury on CKD stage III-patient has baseline CKD from cardiorenal syndrome.  However at this time likely from hypotension along with use of diuretics.   Patient takes torsemide and metolazone at home.  Renal ultrasound showed no acute finding.  Creatinine is up to 5.51 today.  Diuretics are currently on hold.  Nephrology has seen the patient and deemed not a good candidate for hemodialysis.  As per nephrology overall systemic perfusion will be difficult to obtain if needed for any UF with HD at all.  Patient blood pressure has been low.  Palliative care was consulted, however the patient did not want to interact with palliative care.  Nephrologist had long discussion with the patient and explained to him that he would not be a good candidate for hemodialysis.  2. Somnolence/myoclonus-patient has developed  muscle jerking, BUN is 95 today.?  Developing uremia.  Not a dialysis candidate per nephrology.  Continue to monitor.  Nephrology following. .  3. Community-acquired pneumonia-chest x-ray was concerning for left middle lobe and left lower lobe infiltrate.  Patient  started on ceftriaxone and Zithromax.  Last day of antibiotics today.  4. UTI-complaint of dysuria.  UA showed pyuria.  Urine culture grew 40,000 colonies of E. coli sensitive to ceftriaxone.  Treated with IV ceftriaxone as above.  5. Syncopal event/persistent hypotension-patient has persistent hypotension, had a syncopal event.  CT head/CT cervical spine showed no acute finding.  Continue monitoring orthostatic vital signs.  Diuretics are on hold.  Cardiology has been consulted.  Patient started on midodrine 5 mg 3 times daily.  6. Biventricular heart failure-TTE in February 2022 with EF of 45 to 50%.  Severely enlarged RV.  He is on torsemide and metolazone at home.  Currently diuretics on hold.  Repeat echocardiogram shows EF of 50 to 93%, grade 2 diastolic dysfunction.  7. Paroxysmal atrial fibrillation-s/p DCCV in February 2022.  Heart rate controlled.  Continue amiodarone, Eliquis.  8. Chronic COPD/chronic hypoxemic respiratory failure-patient is on 4 L/min oxygen at baseline.  Continue home supplemental oxygen.  DuoNebs as needed.  9. Diabetes mellitus type 2-CBG well controlled after sliding scale was switched to supersensitive sliding scale with NovoLog 0 to 6 units 3 times daily.    10. Chronic back pain-oxycodone was stopped yesterday due to worsening renal failure.  We will start fentanyl 50 mcg IV every 4 hours as needed.  11. Goals of care-I had discussed in detail regarding patient's poor prognosis with patient's wife and she was agreeable for DNR.  Palliative care has been consulted again for goals of care discussion.  Initially patient and his wife did not want to discuss anything with palliative care.  At this time  patient mental status has deteriorated and will not be able to participate in the goals of care discussion.  Palliative care will discuss with patient's wife today.  Hopefully patient can be transitioned to residential hospice    Scheduled medications:   .  amiodarone  200 mg Oral Daily  . apixaban  5 mg Oral BID  . fluticasone  1 spray Each Nare QHS  . fluticasone furoate-vilanterol  1 puff Inhalation Daily   And  . umeclidinium bromide  1 puff Inhalation Daily  . latanoprost  1 drop Both Eyes QHS         Data Reviewed:   CBG:  Recent Labs  Lab 03/15/21 1129 03/15/21 1633 03/15/21 2046 03/16/21 0536 03/16/21 1058  GLUCAP 142* 138* 109* 102* 93    SpO2: 100 % O2 Flow Rate (L/min): 15 L/min (Increased due to low SATs. Will wean as tolerated.)    Vitals:   03/16/21 0400 03/16/21 0758 03/16/21 0800 03/16/21 1056  BP: 101/72   (!) 107/53  Pulse: 69   71  Resp: 18   17  Temp: 97.8 F (36.6 C)   97.9 F (36.6 C)  TempSrc: Oral   Oral  SpO2: 92% (!) 83% (!) 88% 100%  Weight:         Intake/Output Summary (Last 24 hours) at 03/16/2021 1511 Last data filed at 03/16/2021 1322 Gross per 24 hour  Intake 420 ml  Output 850 ml  Net -430 ml    05/12 1901 - 05/14 0700 In: 540 [P.O.:540] Out: 550 [Urine:550]  Filed Weights   03/14/21 0910 03/15/21 0555 03/16/21 0135  Weight: 112.3 kg 113.3 kg 112.2 kg    CBC:  Recent Labs  Lab 04/01/2021 1619 03/11/21 0746 03/12/21 0357 03/12/21 1620 03/13/21 0316  WBC 6.2 6.1 5.8  --  6.4  HGB 8.4* 8.3* 8.0* 8.1* 8.6*  HCT 28.3* 27.8* 27.2* 27.1* 28.5*  PLT 448* 425* 405*  --  420*  MCV 85.8 85.3 86.1  --  85.8  MCH 25.5* 25.5* 25.3*  --  25.9*  MCHC 29.7* 29.9* 29.4*  --  30.2  RDW 19.8* 19.9* 19.9*  --  19.8*    Complete metabolic panel:  Recent Labs  Lab 03/11/21 0746 03/12/21 0357 03/12/21 1735 03/13/21 0316 03/14/21 0351 03/15/21 0258 03/16/21 0200  NA 140 140  --  138 138 138 139  K 5.2* 5.7*  --  5.2* 4.9 5.2* 4.6  CL 103 103  --  103 102 102 102  CO2 27 26  --  _0 GLUCOSE 119* 100*  --  67* 121* 164* 105*  BUN 76* 82*  --  88* 94* 95* 99*  CREATININE 5.19* 5.31*  --  5.51* 5.59* 5.69* 5.46*  CALCIUM 8.2* 8.1*  --  8.3* 8.4* 8.4* 8.5*   ALBUMIN  --  2.6*  --  2.6* 2.5* 2.7* 2.5*  MG 2.4 2.5*  --  2.3  --   --   --   PROCALCITON  --   --  0.48  --   --   --   --   BNP 2,676.5*  --   --   --   --   --   --     No results for input(s): LIPASE, AMYLASE in the last 168 hours.  Recent Labs  Lab 03/11/21 0008 03/11/21 0746 03/12/21 1735  BNP  --  9,359.4*  --   PROCALCITON  --   --  0.48  SARSCOV2NAA NEGATIVE  --   --     ------------------------------------------------------------------------------------------------------------------ No results for input(s): CHOL, HDL, LDLCALC, TRIG, CHOLHDL, LDLDIRECT in the last 72 hours.  Lab Results  Component Value Date   HGBA1C 6.8 (H) 01/28/2021   ------------------------------------------------------------------------------------------------------------------ No results for input(s): TSH, T4TOTAL, T3FREE, THYROIDAB in the last 72 hours.  Invalid input(s): FREET3 ------------------------------------------------------------------------------------------------------------------ No results for input(s): VITAMINB12, FOLATE, FERRITIN, TIBC, IRON, RETICCTPCT in the last 72 hours.  Coagulation profile No results for input(s): INR, PROTIME in the last 168 hours. No results for input(s): DDIMER in the last 72 hours.  Cardiac Enzymes No results for input(s): CKTOTAL, CKMB, CKMBINDEX, TROPONINI in the last 168 hours.  ------------------------------------------------------------------------------------------------------------------    Component Value Date/Time   BNP 2,676.5 (H) 03/11/2021 0746   BNP 191.4 (H) 03/24/2016 1656     Antibiotics: Anti-infectives (From admission, onward)   Start     Dose/Rate Route Frequency Ordered Stop   03/12/21 2200  azithromycin (ZITHROMAX) tablet 250 mg        250 mg Oral Daily at bedtime 03/12/21 0854 03/14/21 2051   03/11/21 2000  azithromycin (ZITHROMAX) 250 mg in dextrose 5 % 125 mL IVPB  Status:  Discontinued       "Followed by"  Linked Group Details   250 mg 125 mL/hr over 60 Minutes Intravenous Every 24 hours 03/22/2021 1828 03/12/21 0854   03/19/2021 1830  azithromycin (ZITHROMAX) 500 mg in sodium chloride 0.9 % 250 mL IVPB       "Followed by" Linked Group Details   500 mg 250 mL/hr over 60 Minutes Intravenous  Once 03/29/2021 1828 03/25/2021 2056   03/18/2021 1700  cefTRIAXone (ROCEPHIN) 1 g in sodium chloride 0.9 % 100 mL IVPB        1 g 200 mL/hr over 30 Minutes Intravenous Every 24 hours 03/31/2021 1659 03/15/21 1737   03/31/2021 1700  doxycycline (VIBRA-TABS) tablet 100 mg  Status:  Discontinued        100 mg Oral Every 12 hours 03/05/2021 1659 03/07/2021 1828       Radiology Reports  No results found.    DVT prophylaxis: Apixaban  Code Status: Full code  Family Communication: No family at bedside   Consultants:  Cardiology  Nephrology  Palliative care  Procedures:      Objective    Physical Examination:  General-appears lethargic Heart-S1-S2, regular, no murmur auscultated Lungs-decreased breath sounds bilaterally at lung bases Abdomen-soft, nontender, no organomegaly Extremities-trace edema in the lower extremities Neuro-alert, oriented x3, no focal deficit noted  Status is: Inpatient  Dispo: The patient is from: Home              Anticipated d/c is to: Home              Anticipated d/c date is: 5/162022              Patient currently not stable for discharge  Barrier to discharge-awaiting discussion with palliative care, likely transfer to residential hospice.  COVID-19 Labs  No results for input(s): DDIMER, FERRITIN, LDH, CRP in the last 72 hours.  Lab Results  Component Value Date   [place] NEGATIVE 03/11/2021   [place] NEGATIVE 01/26/2021   [place] NEGATIVE 01/06/2021   [place] NEGATIVE 11/21/2020    Microbiology  Recent Results (from the past 240 hour(s))  Urine culture     Status: Abnormal   Collection Time: 03/11/2021  7:06 PM   Specimen: Urine,  Clean Catch  Result Value Ref Range  Status   Specimen Description URINE, CLEAN CATCH  Final   Special Requests   Final    NONE Performed at [facility], [place] [address] [place], [place] [address]    Culture 40,000 COLONIES/mL ESCHERICHIA COLI (A)  Final   Report Status 03/13/2021 FINAL  Final   Organism ID, Bacteria ESCHERICHIA COLI (A)  Final      Susceptibility   Escherichia coli - MIC*    AMPICILLIN <=2 SENSITIVE Sensitive     CEFAZOLIN <=4 SENSITIVE Sensitive     CEFEPIME <=0.12 SENSITIVE Sensitive     CEFTRIAXONE <=0.25 SENSITIVE Sensitive     CIPROFLOXACIN <=0.25 SENSITIVE Sensitive     GENTAMICIN <=1 SENSITIVE Sensitive     IMIPENEM <=0.25 SENSITIVE Sensitive     NITROFURANTOIN <=16 SENSITIVE Sensitive     TRIMETH/SULFA <=20 SENSITIVE Sensitive     AMPICILLIN/SULBACTAM <=2 SENSITIVE Sensitive     PIP/TAZO <=4 SENSITIVE Sensitive     * 40,000 COLONIES/mL ESCHERICHIA COLI  Resp Panel by RT-PCR (Flu A&B, Covid) Nasopharyngeal Swab     Status: None   Collection Time: 03/11/21 12:08 AM   Specimen: Nasopharyngeal Swab; Nasopharyngeal(NP) swabs in vial transport medium  Result Value Ref Range Status   SARS Coronavirus 2 by RT PCR NEGATIVE NEGATIVE Final    Comment: (NOTE) SARS-CoV-2 target nucleic acids are NOT DETECTED.  The SARS-CoV-2 RNA is generally detectable in upper respiratory specimens during the acute phase of infection. The lowest concentration of SARS-CoV-2 viral copies this assay can detect is 138 copies/mL. A negative result does not preclude SARS-Cov-2 infection and should not be used as the sole basis for treatment or other patient management decisions. A negative result may occur with  improper specimen collection/handling, submission of specimen other than nasopharyngeal swab, presence of viral mutation(s) within the areas targeted by this assay, and inadequate number of viral copies(<138 copies/mL). A negative result must be combined with clinical  observations, patient history, and epidemiological information. The expected result is Negative.  Fact Sheet for Patients:  EntrepreneurPulse.com.au  Fact Sheet for Healthcare Providers:  IncredibleEmployment.be  This test is no t yet approved or cleared by the [place] FDA and  has been authorized for detection and/or diagnosis of SARS-CoV-2 by FDA under an Emergency Use Authorization (EUA). This EUA will remain  in effect (meaning this test can be used) for the duration of the COVID-19 declaration under Section 564(b)(1) of the Act, 21 U.S.C.section 360bbb-3(b)(1), unless the authorization is terminated  or revoked sooner.       Influenza A by PCR NEGATIVE NEGATIVE Final   Influenza B by PCR NEGATIVE NEGATIVE Final    Comment: (NOTE) The Xpert Xpress SARS-CoV-2/FLU/RSV plus assay is intended as an aid in the diagnosis of influenza from Nasopharyngeal swab specimens and should not be used as a sole basis for treatment. Nasal washings and aspirates are unacceptable for Xpert Xpress SARS-CoV-2/FLU/RSV testing.  Fact Sheet for Patients: EntrepreneurPulse.com.au  Fact Sheet for Healthcare Providers: IncredibleEmployment.be  This test is not yet approved or cleared by the [place] FDA and has been authorized for detection and/or diagnosis of SARS-CoV-2 by FDA under an Emergency Use Authorization (EUA). This EUA will remain in effect (meaning this test can be used) for the duration of the COVID-19 declaration under Section 564(b)(1) of the Act, 21 U.S.C. section 360bbb-3(b)(1), unless the authorization is terminated or revoked.  Performed at [facility], [place] [address] [place], [place] [address]     Pressure  Injury 01/27/21 Buttocks Left;Mid Stage 2 -  Partial thickness loss of dermis presenting as a shallow open injury with a red, pink wound bed without slough. pink 0.5 cm x 0.5 cm  (Active)  01/27/21 0100  Location: Buttocks  Location Orientation: Left;Mid  Staging: Stage 2 -  Partial thickness loss of dermis presenting as a shallow open injury with a red, pink wound bed without slough.  Wound Description (Comments): pink 0.5 cm x 0.5 cm  Present on Admission: Yes     Pressure Injury 03/11/21 Coccyx Mid Stage 2 -  Partial thickness loss of dermis presenting as a shallow open injury with a red, pink wound bed without slough. (Active)  03/11/21 0140  Location: Coccyx  Location Orientation: Mid  Staging: Stage 2 -  Partial thickness loss of dermis presenting as a shallow open injury with a red, pink wound bed without slough.  Wound Description (Comments):   Present on Admission: Yes          [place]   [facility] If 7PM-7AM, please contact night-coverage at [email or web address], Office  [phone]   03/16/2021, 3:11 PM  LOS: 5 days

## 2021-03-16 NOTE — Progress Notes (Signed)
   03/16/21 1526  Clinical Encounter Type  Visited With Patient and family together  Visit Type Initial;Spiritual support;Social support;Critical Care  Referral From Nurse  Consult/Referral To [place] responded to page for prayer. Pt's wife, Fraser Din, was at bedside. Chaplain engaged active listening and provided emotional and spiritual support. Chaplain prayed for strength and peace in the coming days. Pt mentioned multiple times how much pain he was in, so chaplain checked with Pt's nurse, Judson Roch, about pain medication for Pt's wife. Per Judson Roch, they are waiting on verification from the pharmacy. Chaplain updated Pt and Pt's wife with this information. Chaplain remains available.   This note was prepared by Chaplain Resident, Dante Gang, MDiv. Chaplain remains available as needed through the on-call pager: [phone].

## 2021-03-16 NOTE — Social Work (Signed)
CSW was alerted that family is in agreement with hospice facility. CSW confirmed choice of [facility] and made the referral to [facility] for follow up with wife.

## 2021-03-16 NOTE — Progress Notes (Signed)
Subjective:  More somnolent-  In more pain per primary started fentanyl-  Palliative called back -  He is not hungry some inc RR   Objective Vital signs in last 24 hours: Vitals:   03/16/21 0135 03/16/21 0400 03/16/21 0758 03/16/21 0800  BP:  101/72    Pulse:  69    Resp:  18    Temp:  97.8 F (36.6 C)    TempSrc:  Oral    SpO2:  92% (!) 83% (!) 88%  Weight: 112.2 kg      Weight change: -0.1 kg  Intake/Output Summary (Last 24 hours) at 03/16/2021 0932 Last data filed at 03/16/2021 0129 Gross per 24 hour  Intake 180 ml  Output 400 ml  Net -220 ml    Assessment/Plan: 75 year old BM with severe biventricular heart failure, COPD and CKD-  Failure to thrive of sorts with this being the 6th hospitalization in 2022.   Now with repeated A on CRF 1.Renal- A on CRF.  Has baseline severe CKD thought due to cardiorenal syndrome.    I  agree with his primary nephrologist Dr. Marval Regal that dialysis is not a good option here-  Would not make him any less medically complex and overall systemic perfusion would be difficult to attain if he needed any UF with HD at all. I have talked to him about how serious this situation is, reiterated that his heart is not strong enough for dialysis.  He is having a tough time accepting but has made progress.  Will continue to attempt to manage medically and hope that renal function will turn around but I am not hopeful.  He may now be having some uremic sxms.  I do not see him bouncing back.  I have asked him to reconsider palliative care involvement-  He agrees-  I think we need a strategy to stabilize him-- then d/c to possibly  SNF or [place] with hospice 2. Hypertension/volume  - right now maybe overloaded.  I agreed with management initially to hold diuretics and to hydrate but stopped IVF early on.  There do not seem to be any options to increase BP and increase perfusion per [name].   Started on midodrine but has not made much difference  3. ID-  uti  and possible PNA-  abx now stopped 4. Uric acid of over 11-  Likely is not helping-  Allopurinol added 5. Anemia  - due to frequent hospitalizations and CKD-  have added ESA-  Avoid iv iron given current infections 6.  Hyperkalemia-   giving lokelma-   dec to once daily yesterday-  Will continue  7.  Consider palliative care involement -  he refused initially. I talked to he and his wife on 5/13 and they are more open.     Louis Meckel    Labs: Basic Metabolic Panel: Recent Labs  Lab 03/14/21 0351 03/15/21 0258 03/16/21 0200  NA 138 138 139  K 4.9 5.2* 4.6  CL 102 102 102  CO2 _0 GLUCOSE 121* 164* 105*  BUN 94* 95* 99*  CREATININE 5.59* 5.69* 5.46*  CALCIUM 8.4* 8.4* 8.5*  PHOS 7.9* 8.9* 8.9*   Liver Function Tests: Recent Labs  Lab 03/14/21 0351 03/15/21 0258 03/16/21 0200  ALBUMIN 2.5* 2.7* 2.5*   No results for input(s): LIPASE, AMYLASE in the last 168 hours. No results for input(s): AMMONIA in the last 168 hours. CBC: Recent Labs  Lab 03/28/2021 1619 03/11/21 0746  03/12/21 0357 03/12/21 1620 03/13/21 0316  WBC 6.2 6.1 5.8  --  6.4  HGB 8.4* 8.3* 8.0* 8.1* 8.6*  HCT 28.3* 27.8* 27.2* 27.1* 28.5*  MCV 85.8 85.3 86.1  --  85.8  PLT 448* 425* 405*  --  420*   Cardiac Enzymes: No results for input(s): CKTOTAL, CKMB, CKMBINDEX, TROPONINI in the last 168 hours. CBG: Recent Labs  Lab 03/15/21 0554 03/15/21 1129 03/15/21 1633 03/15/21 2046 03/16/21 0536  GLUCAP 160* 142* 138* 109* 102*    Iron Studies: No results for input(s): IRON, TIBC, TRANSFERRIN, FERRITIN in the last 72 hours. Studies/Results: No results found. Medications: Infusions:   Scheduled Medications: . allopurinol  100 mg Oral Daily  . amiodarone  200 mg Oral Daily  . apixaban  5 mg Oral BID  . atorvastatin  40 mg Oral Daily  . darbepoetin (ARANESP) injection - NON-DIALYSIS  150 mcg Subcutaneous Q Mon-1800  . ferrous sulfate  325 mg Oral Q M,W,F-1800  . fluticasone  1  spray Each Nare QHS  . fluticasone furoate-vilanterol  1 puff Inhalation Daily   And  . umeclidinium bromide  1 puff Inhalation Daily  . gabapentin  300 mg Oral QHS  . insulin aspart  0-5 Units Subcutaneous QHS  . insulin aspart  0-6 Units Subcutaneous TID WC  . insulin aspart protamine- aspart  5 Units Subcutaneous BID WC  . latanoprost  1 drop Both Eyes QHS  . midodrine  5 mg Oral TID WC  . pantoprazole  40 mg Oral BID  . sodium chloride flush  3 mL Intravenous Q12H  . sodium zirconium cyclosilicate  10 g Oral Daily  . tamsulosin  0.4 mg Oral Daily    have reviewed scheduled and prn medications.  Physical Exam: General: somnolent but arousable Heart: RRR Lungs: dec BS at bases-  Big O2 req Abdomen: obese, soft Extremities: min edema    03/16/2021,8:37 AM  LOS: 5 days

## 2021-03-16 NOTE — Progress Notes (Addendum)
Called and discussed with patient's wife, patient respiratory status is now worsening.  BUN/creatinine is up to 99/5.46.  He is showing signs of uremia.  No hemodialysis planned per nephrology.  Patient has extremely poor prognosis.  Patient's wife is agreeable to make him DNR.  Plan Start fentanyl 25 mcg IV every 4 hours as needed for pain, dyspnea Change CODE STATUS to DNR. Palliative care consult pending

## 2021-03-17 DIAGNOSIS — N170 Acute kidney failure with tubular necrosis: Secondary | ICD-10-CM | POA: Diagnosis not present

## 2021-03-17 DIAGNOSIS — Z515 Encounter for palliative care: Secondary | ICD-10-CM | POA: Diagnosis not present

## 2021-03-17 DIAGNOSIS — Z66 Do not resuscitate: Secondary | ICD-10-CM | POA: Diagnosis not present

## 2021-03-17 DIAGNOSIS — Z7189 Other specified counseling: Secondary | ICD-10-CM | POA: Diagnosis not present

## 2021-03-17 DIAGNOSIS — N183 Chronic kidney disease, stage 3 unspecified: Secondary | ICD-10-CM | POA: Diagnosis not present

## 2021-03-17 MED ORDER — HYDROMORPHONE HCL 1 MG/ML IJ SOLN
1.0000 mg | INTRAMUSCULAR | Status: DC | PRN
  Administered 2021-03-17: 1 mg via INTRAVENOUS
  Filled 2021-03-17: qty 1

## 2021-03-17 MED ORDER — SODIUM CHLORIDE 0.9 % IV SOLN
1.0000 mg/h | INTRAVENOUS | Status: DC
  Administered 2021-03-17: 1 mg/h via INTRAVENOUS
  Filled 2021-03-17: qty 2.5

## 2021-03-18 ENCOUNTER — Other Ambulatory Visit: Payer: Self-pay

## 2021-03-18 NOTE — Patient Outreach (Signed)
[facility]) Care Management  03/18/2021  Nicholas Caldwell [birth year] [id number]   Patient noted to be deceased. Case closed.  Jone Baseman, RN, MSN [place] [facility] Care Management Coordinator Direct Line [phone] Cell [phone] Toll Free: [phone]  Fax: [phone]

## 2021-03-27 DIAGNOSIS — R269 Unspecified abnormalities of gait and mobility: Secondary | ICD-10-CM | POA: Diagnosis not present

## 2021-03-27 DIAGNOSIS — J9621 Acute and chronic respiratory failure with hypoxia: Secondary | ICD-10-CM | POA: Diagnosis not present

## 2021-03-27 DIAGNOSIS — J449 Chronic obstructive pulmonary disease, unspecified: Secondary | ICD-10-CM | POA: Diagnosis not present

## 2021-03-27 DIAGNOSIS — J441 Chronic obstructive pulmonary disease with (acute) exacerbation: Secondary | ICD-10-CM | POA: Diagnosis not present

## 2021-04-03 ENCOUNTER — Encounter (HOSPITAL_COMMUNITY): Payer: No Typology Code available for payment source

## 2021-04-03 NOTE — Progress Notes (Signed)
RN wasted 22m of the dilaudid bag which was left from the pt, thanks MBuckner Malta

## 2021-04-03 NOTE — Death Summary Note (Addendum)
Death Summary  Zakk Borgen KGM:[id number] DOB: [birth year] DOA: 04-07-2021  PCP: [name], MD   Admit date: 04-07-2021 Date of Death: 04-14-2021  Final Diagnoses:  Principal Problem:   Acute renal failure superimposed on stage 3b chronic kidney disease ([place]) Active Problems:   Essential hypertension   Type 2 diabetes mellitus with neuropathy   Hyperlipidemia    PAF (paroxysmal atrial fibrillation) (HCC)   Coronary artery disease involving native coronary artery of native heart   Chronic systolic CHF (congestive heart failure) (HCC)   COPD (chronic obstructive pulmonary disease) (HCC)   Pressure injury of skin with suspected deep tissue injury   OSA (obstructive sleep apnea)   Chronic respiratory failure with hypoxia (HCC)   RVF (right ventricular failure) (HCC)   Iron deficiency anemia   CAP (community acquired pneumonia)   Acute renal failure superimposed on stage 3 chronic kidney disease ([place])    1.   History of present illness:  75 year old male with past medical history of biventricular heart failure, CKD stage IIIb, COPD on 4 L/min of oxygen, OSA not on CPAP, atrial fibrillation, CVA, CAD, diabetes mellitus type 2, hypertension, hyperlipidemia, chronic pain syndrome presents with dysuria, lightheadedness, fever productive cough for 2 days.  He was admitted for severe sepsis due to pneumonia and UTI with AKI.  Patient started on IV ceftriaxone, azithromycin.   Hospital Course:  1. Acute kidney injury on CKD stage III-at baseline CKD from cardiorenal syndrome.  He was taking torsemide and metolazone at home.  Presented with worsening renal function.  Renal ultrasound showed no acute finding.    Nephrology has seen the patient and deemed not a good candidate for hemodialysis.  As per nephrology overall systemic perfusion will be difficult to obtain if needed for any UF with HD at all.  Patient blood pressure has been low.  Palliative care was consulted, however the patient did  not want to interact with palliative care.    Cardiology also recommended comfort measures.  Nephrologist had long discussion with the patient and explained to him that he would not be a good candidate for hemodialysis.   Patient was made full comfort care.  Started on IV Dilaudid  for comfort and shortness of breath..   3. Community-acquired pneumonia-chest x-ray was concerning for left middle lobe and left lower lobe infiltrate.  Patient was treated with  ceftriaxone and Zithromax.    4. UTI-complaint of dysuria.  UA showed pyuria.  Urine culture grew 40,000 colonies of E. coli sensitive to ceftriaxone.  Treated with IV ceftriaxone as above.    Goals of care-goals of care discussion was done with patient's wife.  He agreed for DNR after that made patient full comfort measures only.  Plan was to go to residential hospice however patient expired on 04-14-2021 at 5:11 PM.       Time: Of death 5:11 PM  Signed:  Oswald Hillock  [facility] 03/19/2021, 4:38 PM

## 2021-04-03 NOTE — Progress Notes (Signed)
I see that plans are now for full comfort care and discharge to [facility] when bed available.  Renal will sign off, call with any questions   Louis Meckel

## 2021-04-03 NOTE — Progress Notes (Addendum)
Chaplain provided prayer and spiritual/emotional support to wife, Nicholas Caldwell, daughter, Nicholas Caldwell, and sister, Nicholas Caldwell and other family members.  Family stated that pt was ready, but they were not; also, with the exception of daughter, they were not prepared for death to happen this quickly.    Chaplain returned to offer comfort, support, hospitality and orientation.    Family will use [facility] [address].    NOK is the same as on file:  Spouse, Nicholas Caldwell, [address] [place], [place], cell: [phone].  Please call for any follow-up as needed.    Luana Shu [phone]    2021-04-13 1700  Clinical Encounter Type  Visited With Family  Visit Type Death  Referral From Nurse  Consult/Referral To Chaplain  Spiritual Encounters  Spiritual Needs Prayer  Stress Factors  Family Stress Factors Loss

## 2021-04-03 NOTE — Progress Notes (Signed)
Palliative Medicine Inpatient Follow Up Note   Reason for consult:  Goals of Care "Goals of care- family now willing to talk  with palliative team"  HPI:  Per intake H&P --> Nicholas Caldwell is a 75 year old male with past medical history of biventricular heart failure (LVEF 50-55%), CKD stage IIIb, COPD on 4 L/min of oxygen, OSA not on CPAP, atrial fibrillation, CVA, CAD, diabetes mellitus type 2, hypertension, hyperlipidemia, chronic pain syndrome presents with dysuria, lightheadedness, fever productive cough for 2 days.  He was admitted for severe sepsis due to pneumonia and UTI with AKI.  Patient started on IV ceftriaxone, azithromycin.  Nicholas Caldwell has been seen by the Nicholas Caldwell Palliative care team ten times in the past year and a half. He has been hospitalized seven times in the past six months with an additional one ER visit in the setting of cardio-renal syndrome.  Nicholas Caldwell has been admitted for five days and is now PMT was consulted on 5/10 and patient was not interested in speaking with the team at that time.  Nicholas Caldwell is now in a situation where he is in renal failure not in the setting of volume overload. Nephrology has deemed that he is not a strong dialysis candidate d/t frailty and have requested palliative care to aid in goals of care conversations.   Today's Discussion (04-12-2021):  *Please note that this is a verbal dictation therefore any spelling or grammatical errors are due to the "[employer] [employer]" system interpretation.   I met with Nicholas Caldwell' RN, Nicholas Caldwell. Reviewed that his pain was under better control with the dilaudid. Discussed if need be adding it as an ATC medication though at this point it does not seem this will be needed.  I met at bedside with Nicholas Caldwell, he appeared more comfortable this morning which he was also able to endorse to be. He has no complaints this morning.  Plan will be for transition to [facility] once a bed is available.   Decision Maker: Nicholas Caldwell  (Spouse) [phone]  SUMMARY OF RECOMMENDATIONS   DNAR/DNI  Comfort care   Liberalize visitation policy  Medications per [facility] - Have transitioned from fentanyl to dilaudid to achieve better symptom control  Inquiry send to [place] support appreciated  Ongoing incremental palliative support ________________________________________________________________________________ Objective Assessment: Vital Signs Vitals:   12-Apr-2021 0708 04-12-21 0801  BP: (!) 130/59   Pulse: 79   Resp: 14   Temp:    SpO2: 100% (!) 89%    Intake/Output Summary (Last 24 hours) at 04-12-2021 0900 Last data filed at 04/12/21 0700 Gross per 24 hour  Intake 520 ml  Output 875 ml  Net -355 ml   Last Weight  Most recent update: 03/16/2021  1:35 AM   Weight  112.2 kg (247 lb 5.7 oz)           Gen: Elderly ill appearing M in respiratory distress HEENT: Dry mucous membranes CV: Regular rate and rhythm  PULM: On 15LPM [place] ABD: soft/nontender/nondistended  EXT: No edema  Neuro: Disoriented aware of self though not situation  Discussed with Nicholas Caldwell  Time Spent: 15 Greater than 50% of the time was spent in counseling and coordination of care ______________________________________________________________________________________ [facility] Team Team Cell Phone: [phone] Please utilize secure chat with additional questions, if there is no response within 30 minutes please call the above phone number  Palliative Medicine Team providers are available by phone from 7am to 7pm daily and  can be reached through the team cell phone.  Should this patient require assistance outside of these hours, please call the patient's attending physician.

## 2021-04-03 NOTE — Progress Notes (Addendum)
Manufacturing engineer [facility]) Hospital Liaison note.    Received request from [place] for family interest in [facility]. Chart and pt information under review by [facility] physician.  Hospice eligibility pending at this time.  [facility] is unable to offer a room today. Hospital Liaison will follow up tomorrow or sooner if a room becomes available. Please do not hesitate to call with questions.     Venia Carbon RN, BSN, [facility] Liaison    **pt is eligible for residential hospice.

## 2021-04-03 NOTE — Progress Notes (Signed)
[facility]  PROGRESS NOTE  [name] GYB:[id number] DOB: Oct 30, 1946 DOA: 03/29/2021 PCP: [name], MD   Brief HPI:   75 year old male with past medical history of biventricular heart failure, CKD stage IIIb, COPD on 4 L/min of oxygen, OSA not on CPAP, atrial fibrillation, CVA, CAD, diabetes mellitus type 2, hypertension, hyperlipidemia, chronic pain syndrome presents with dysuria, lightheadedness, fever productive cough for 2 days.  He was admitted for severe sepsis due to pneumonia and UTI with AKI.  Patient started on IV ceftriaxone, azithromycin.    Subjective   Patient seen and examined, Nicholas Caldwell comfort care yesterday.  Started on IV Dilaudid infusion this morning for comfort/dyspnea.   Assessment/Plan:     1. Acute kidney injury on CKD stage III-patient has baseline CKD from cardiorenal syndrome.  However at this time likely from hypotension along with use of diuretics.   Patient takes torsemide and metolazone at home.  Renal ultrasound showed no acute finding.  Creatinine is up to 5.51 today.  Diuretics are currently on hold.  Nephrology has seen the patient and deemed not a good candidate for hemodialysis.  As per nephrology overall systemic perfusion will be difficult to obtain if needed for any UF with HD at all.  Patient blood pressure has been low.  Palliative care was consulted, however the patient did not want to interact with palliative care.  Nephrologist had long discussion with the patient and explained to him that he would not be a good candidate for hemodialysis.  Patient has met with full comfort care.  Plan to go to residential hospice once bed available.  2. Somnolence/myoclonus-patient has developed  muscle jerking, BUN is 95 today.?  Developing uremia.  Not a dialysis candidate per nephrology.    3. Community-acquired pneumonia-chest x-ray was concerning for left middle lobe and left lower lobe infiltrate.  Patient was treated with  ceftriaxone and  Zithromax.    4. UTI-complaint of dysuria.  UA showed pyuria.  Urine culture grew 40,000 colonies of E. coli sensitive to ceftriaxone.  Treated with IV ceftriaxone as above.  5. Syncopal event/persistent hypotension-patient has persistent hypotension, had a syncopal event.  CT head/CT cervical spine showed no acute finding.  Continue monitoring orthostatic vital signs.  Diuretics are on hold.  Cardiology has been consulted.  Patient started on midodrine 5 mg 3 times daily.  6. Biventricular heart failure-TTE in February 2022 with EF of 45 to 50%.  Severely enlarged RV.  He is on torsemide and metolazone at home.  Currently diuretics on hold.  Repeat echocardiogram shows EF of 50 to 87%, grade 2 diastolic dysfunction.  7. Paroxysmal atrial fibrillation-s/p DCCV in February 2022.  Heart rate controlled.  Continue amiodarone, Eliquis.  8. Chronic COPD/chronic hypoxemic respiratory failure-patient is on 4 L/min oxygen at baseline.  Continue home supplemental oxygen.  DuoNebs as needed.  9. Diabetes mellitus type 2-CBG well controlled after sliding scale was switched to supersensitive sliding scale with NovoLog 0 to 6 units 3 times daily.    10. Chronic back pain-oxycodone was stopped yesterday due to worsening renal failure.  We will start fentanyl 50 mcg IV every 4 hours as needed.  Goals of care-I had discussed in detail regarding patient's poor prognosis with patient's wife and she was agreeable for DNR.  Palliative care has been consulted again for goals of care discussion.  Initially patient and his wife did not want to discuss anything with palliative care.  At this time patient mental status has deteriorated and will  not be able to participate in the goals of care discussion.  Patient's life discussed with palliative care, patient has been transitioned to full comfort measures only.  Plan to go to residential hospice.   Scheduled medications:   . amiodarone  200 mg Oral Daily  . apixaban  5  mg Oral BID  . fluticasone  1 spray Each Nare QHS  . fluticasone furoate-vilanterol  1 puff Inhalation Daily   And  . umeclidinium bromide  1 puff Inhalation Daily  . latanoprost  1 drop Both Eyes QHS         Data Reviewed:   CBG:  Recent Labs  Lab 03/15/21 1129 03/15/21 1633 03/15/21 2046 03/16/21 0536 03/16/21 1058  GLUCAP 142* 138* 109* 102* 93    SpO2: 92 % O2 Flow Rate (L/min): 15 L/min    Vitals:   04/05/2021 0000 04-05-21 0708 05-Apr-2021 0801 2021-04-05 1158  BP:  (!) 130/59  (!) 117/58  Pulse:  79  73  Resp:  14  18  Temp:      TempSrc:      SpO2: 93% 100% (!) 89% 92%  Weight:         Intake/Output Summary (Last 24 hours) at 2021-04-05 1328 Last data filed at 04-05-2021 0700 Gross per 24 hour  Intake 100 ml  Output 425 ml  Net -325 ml    05/13 1901 - 05/15 0700 In: 520 [P.O.:520] Out: 1275 [Urine:1275]  Filed Weights   03/14/21 0910 03/15/21 0555 03/16/21 0135  Weight: 112.3 kg 113.3 kg 112.2 kg    CBC:  Recent Labs  Lab 04/02/2021 1619 03/11/21 0746 03/12/21 0357 03/12/21 1620 03/13/21 0316  WBC 6.2 6.1 5.8  --  6.4  HGB 8.4* 8.3* 8.0* 8.1* 8.6*  HCT 28.3* 27.8* 27.2* 27.1* 28.5*  PLT 448* 425* 405*  --  420*  MCV 85.8 85.3 86.1  --  85.8  MCH 25.5* 25.5* 25.3*  --  25.9*  MCHC 29.7* 29.9* 29.4*  --  30.2  RDW 19.8* 19.9* 19.9*  --  19.8*    Complete metabolic panel:  Recent Labs  Lab 03/11/21 0746 03/12/21 0357 03/12/21 1735 03/13/21 0316 03/14/21 0351 03/15/21 0258 03/16/21 0200  NA 140 140  --  138 138 138 139  K 5.2* 5.7*  --  5.2* 4.9 5.2* 4.6  CL 103 103  --  103 102 102 102  CO2 27 26  --  _0 GLUCOSE 119* 100*  --  67* 121* 164* 105*  BUN 76* 82*  --  88* 94* 95* 99*  CREATININE 5.19* 5.31*  --  5.51* 5.59* 5.69* 5.46*  CALCIUM 8.2* 8.1*  --  8.3* 8.4* 8.4* 8.5*  ALBUMIN  --  2.6*  --  2.6* 2.5* 2.7* 2.5*  MG 2.4 2.5*  --  2.3  --   --   --   PROCALCITON  --   --  0.48  --   --   --   --   BNP 2,676.5*   --   --   --   --   --   --     No results for input(s): LIPASE, AMYLASE in the last 168 hours.  Recent Labs  Lab 03/11/21 0008 03/11/21 0746 03/12/21 1735  BNP  --  2,595.6*  --   PROCALCITON  --   --  0.48  SARSCOV2NAA NEGATIVE  --   --     ------------------------------------------------------------------------------------------------------------------ No results for  input(s): CHOL, HDL, LDLCALC, TRIG, CHOLHDL, LDLDIRECT in the last 72 hours.  Lab Results  Component Value Date   HGBA1C 6.8 (H) 01/28/2021   ------------------------------------------------------------------------------------------------------------------ No results for input(s): TSH, T4TOTAL, T3FREE, THYROIDAB in the last 72 hours.  Invalid input(s): FREET3 ------------------------------------------------------------------------------------------------------------------ No results for input(s): VITAMINB12, FOLATE, FERRITIN, TIBC, IRON, RETICCTPCT in the last 72 hours.  Coagulation profile No results for input(s): INR, PROTIME in the last 168 hours. No results for input(s): DDIMER in the last 72 hours.  Cardiac Enzymes No results for input(s): CKTOTAL, CKMB, CKMBINDEX, TROPONINI in the last 168 hours.  ------------------------------------------------------------------------------------------------------------------    Component Value Date/Time   BNP 2,676.5 (H) 03/11/2021 0746   BNP 191.4 (H) 03/24/2016 1656     Antibiotics: Anti-infectives (From admission, onward)   Start     Dose/Rate Route Frequency Ordered Stop   03/12/21 2200  azithromycin (ZITHROMAX) tablet 250 mg        250 mg Oral Daily at bedtime 03/12/21 0854 03/14/21 2051   03/11/21 2000  azithromycin (ZITHROMAX) 250 mg in dextrose 5 % 125 mL IVPB  Status:  Discontinued       "Followed by" Linked Group Details   250 mg 125 mL/hr over 60 Minutes Intravenous Every 24 hours 03/22/2021 1828 03/12/21 0854   03/05/2021 1830  azithromycin  (ZITHROMAX) 500 mg in sodium chloride 0.9 % 250 mL IVPB       "Followed by" Linked Group Details   500 mg 250 mL/hr over 60 Minutes Intravenous  Once 03/26/2021 1828 03/16/2021 2056   03/06/2021 1700  cefTRIAXone (ROCEPHIN) 1 g in sodium chloride 0.9 % 100 mL IVPB        1 g 200 mL/hr over 30 Minutes Intravenous Every 24 hours 03/15/2021 1659 03/15/21 1737   03/21/2021 1700  doxycycline (VIBRA-TABS) tablet 100 mg  Status:  Discontinued        100 mg Oral Every 12 hours 03/05/2021 1659 03/04/2021 1828       Radiology Reports  No results found.    DVT prophylaxis: Apixaban  Code Status: Full code  Family Communication: No family at bedside   Consultants:  Cardiology  Nephrology  Palliative care  Procedures:      Objective    Physical Examination:  General-appears lethargic Heart-S1-S2, regular, no murmur auscultated Lungs-clear to auscultation bilaterally, no wheezing or crackles auscultated Abdomen-soft, nontender, no organomegaly Extremities-no edema in the lower extremities Neuro-somnolent but arousable, not following commands  Status is: Inpatient  Dispo: The patient is from: Home              Anticipated d/c is to: Home              Anticipated d/c date is: 5/162022              Patient currently not stable for discharge  Barrier to discharge-awaiting bed at residential hospice  COVID-19 Labs  No results for input(s): DDIMER, FERRITIN, LDH, CRP in the last 72 hours.  Lab Results  Component Value Date   [place] NEGATIVE 03/11/2021   [place] NEGATIVE 01/26/2021   [place] NEGATIVE 01/06/2021   [place] NEGATIVE 11/21/2020    Microbiology  Recent Results (from the past 240 hour(s))  Urine culture     Status: Abnormal   Collection Time: 03/26/2021  7:06 PM   Specimen: Urine, Clean Catch  Result Value Ref Range Status   Specimen Description URINE, CLEAN CATCH  Final   Special Requests   Final    NONE Performed  at [facility],  [place] [address], [place], [place] [address]    Culture 40,000 COLONIES/mL ESCHERICHIA COLI (A)  Final   Report Status 03/13/2021 FINAL  Final   Organism ID, Bacteria ESCHERICHIA COLI (A)  Final      Susceptibility   Escherichia coli - MIC*    AMPICILLIN <=2 SENSITIVE Sensitive     CEFAZOLIN <=4 SENSITIVE Sensitive     CEFEPIME <=0.12 SENSITIVE Sensitive     CEFTRIAXONE <=0.25 SENSITIVE Sensitive     CIPROFLOXACIN <=0.25 SENSITIVE Sensitive     GENTAMICIN <=1 SENSITIVE Sensitive     IMIPENEM <=0.25 SENSITIVE Sensitive     NITROFURANTOIN <=16 SENSITIVE Sensitive     TRIMETH/SULFA <=20 SENSITIVE Sensitive     AMPICILLIN/SULBACTAM <=2 SENSITIVE Sensitive     PIP/TAZO <=4 SENSITIVE Sensitive     * 40,000 COLONIES/mL ESCHERICHIA COLI  Resp Panel by RT-PCR (Flu A&B, Covid) Nasopharyngeal Swab     Status: None   Collection Time: 03/11/21 12:08 AM   Specimen: Nasopharyngeal Swab; Nasopharyngeal(NP) swabs in vial transport medium  Result Value Ref Range Status   SARS Coronavirus 2 by RT PCR NEGATIVE NEGATIVE Final    Comment: (NOTE) SARS-CoV-2 target nucleic acids are NOT DETECTED.  The SARS-CoV-2 RNA is generally detectable in upper respiratory specimens during the acute phase of infection. The lowest concentration of SARS-CoV-2 viral copies this assay can detect is 138 copies/mL. A negative result does not preclude SARS-Cov-2 infection and should not be used as the sole basis for treatment or other patient management decisions. A negative result may occur with  improper specimen collection/handling, submission of specimen other than nasopharyngeal swab, presence of viral mutation(s) within the areas targeted by this assay, and inadequate number of viral copies(<138 copies/mL). A negative result must be combined with clinical observations, patient history, and epidemiological information. The expected result is Negative.  Fact Sheet for Patients:   EntrepreneurPulse.com.au  Fact Sheet for Healthcare Providers:  IncredibleEmployment.be  This test is no t yet approved or cleared by the [place] FDA and  has been authorized for detection and/or diagnosis of SARS-CoV-2 by FDA under an Emergency Use Authorization (EUA). This EUA will remain  in effect (meaning this test can be used) for the duration of the COVID-19 declaration under Section 564(b)(1) of the Act, 21 U.S.C.section 360bbb-3(b)(1), unless the authorization is terminated  or revoked sooner.       Influenza A by PCR NEGATIVE NEGATIVE Final   Influenza B by PCR NEGATIVE NEGATIVE Final    Comment: (NOTE) The Xpert Xpress SARS-CoV-2/FLU/RSV plus assay is intended as an aid in the diagnosis of influenza from Nasopharyngeal swab specimens and should not be used as a sole basis for treatment. Nasal washings and aspirates are unacceptable for Xpert Xpress SARS-CoV-2/FLU/RSV testing.  Fact Sheet for Patients: EntrepreneurPulse.com.au  Fact Sheet for Healthcare Providers: IncredibleEmployment.be  This test is not yet approved or cleared by the [place] FDA and has been authorized for detection and/or diagnosis of SARS-CoV-2 by FDA under an Emergency Use Authorization (EUA). This EUA will remain in effect (meaning this test can be used) for the duration of the COVID-19 declaration under Section 564(b)(1) of the Act, 21 U.S.C. section 360bbb-3(b)(1), unless the authorization is terminated or revoked.  Performed at [facility], [place] [address] [place], [place] [address]     Pressure Injury 01/27/21 Buttocks Left;Mid Stage 2 -  Partial thickness loss of dermis presenting as a shallow open injury with a  red, pink wound bed without slough. pink 0.5 cm x 0.5 cm (Active)  01/27/21 0100  Location: Buttocks  Location Orientation: Left;Mid  Staging: Stage 2 -  Partial thickness loss of  dermis presenting as a shallow open injury with a red, pink wound bed without slough.  Wound Description (Comments): pink 0.5 cm x 0.5 cm  Present on Admission: Yes     Pressure Injury 03/11/21 Coccyx Mid Stage 2 -  Partial thickness loss of dermis presenting as a shallow open injury with a red, pink wound bed without slough. (Active)  03/11/21 0140  Location: Coccyx  Location Orientation: Mid  Staging: Stage 2 -  Partial thickness loss of dermis presenting as a shallow open injury with a red, pink wound bed without slough.  Wound Description (Comments):   Present on Admission: Yes          [place]   [facility] If 7PM-7AM, please contact night-coverage at [email or web address], Office  [phone]   April 10, 2021, 1:28 PM  LOS: 6 days

## 2021-04-03 NOTE — Progress Notes (Signed)
Time of death 5:11  Sydnee Levans second RN verification

## 2021-04-03 DEATH — deceased

## 2021-04-25 IMAGING — CR DG CHEST 2V
2 series · 2 of 2 positions shown · non-contrast
Comparison: 08/26/2019

CLINICAL DATA: Shortness of breath

EXAM:
CHEST - 2 VIEW

[chest pa]
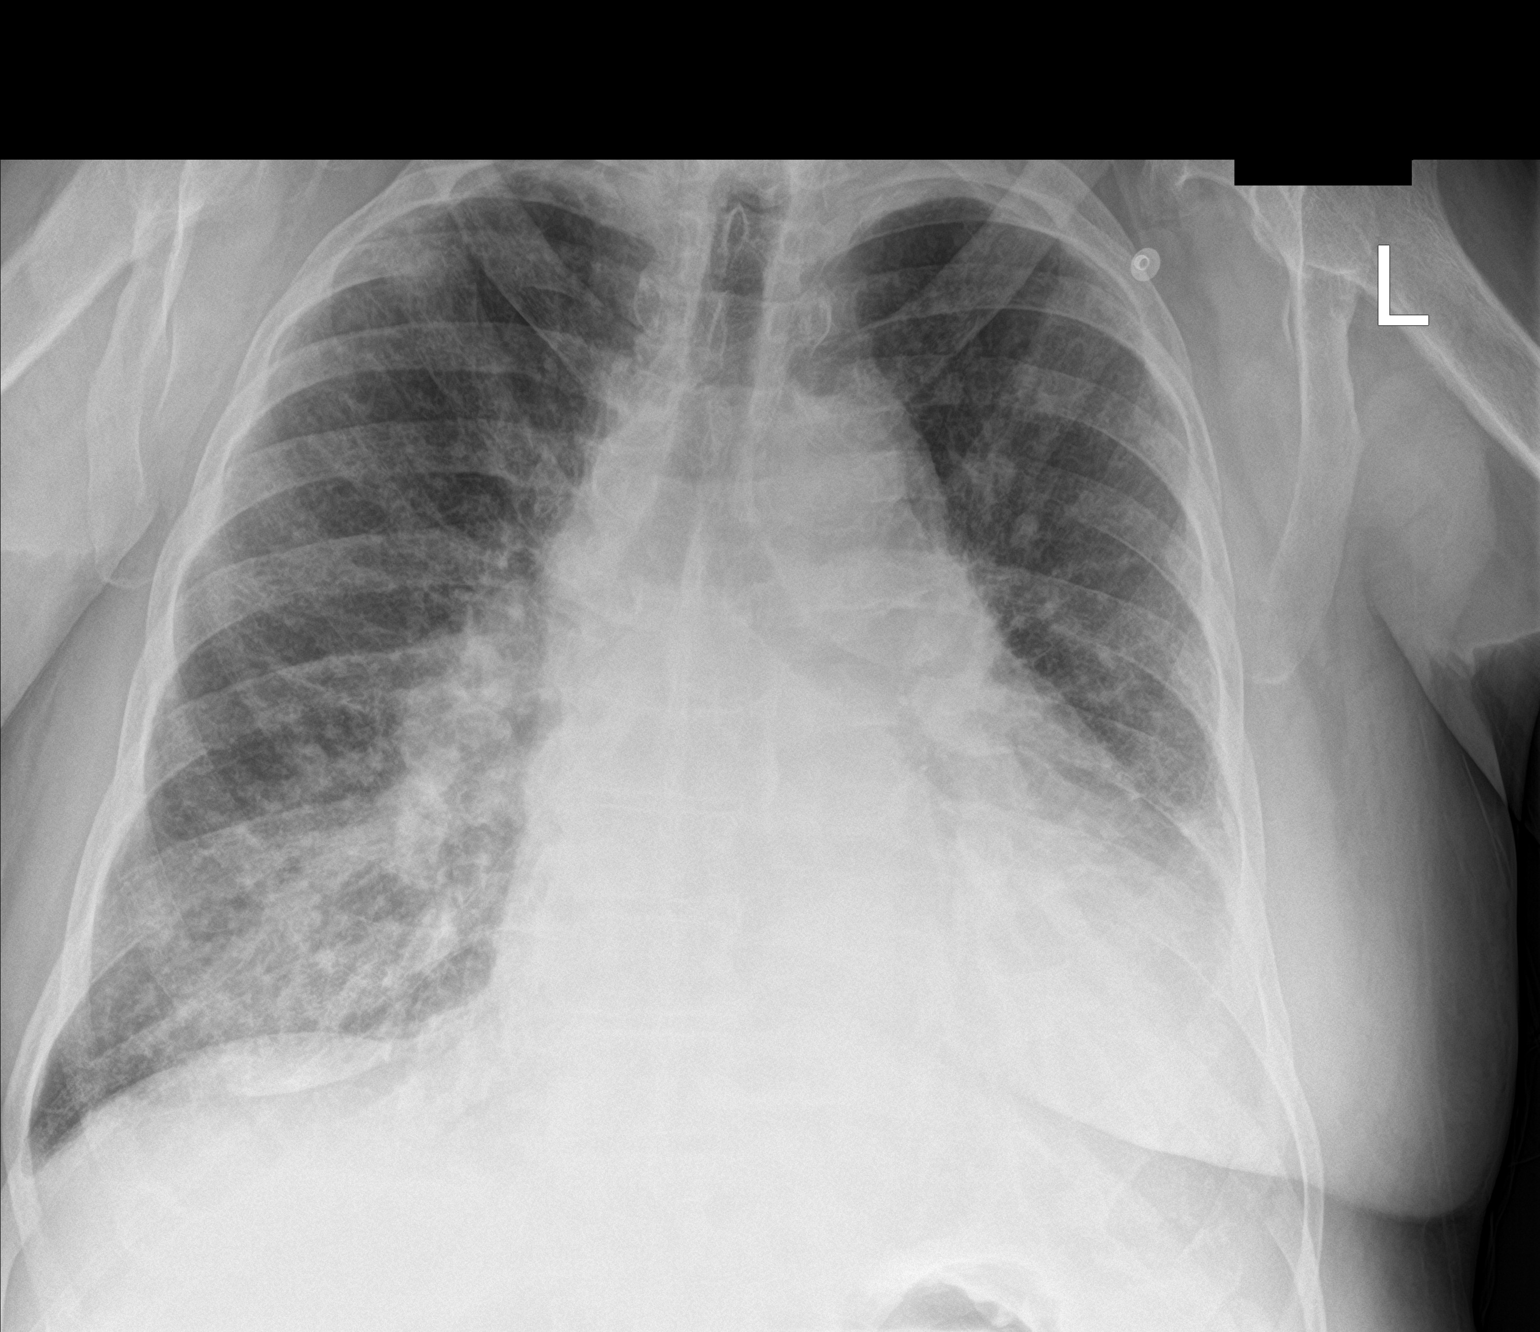

[chest lat]
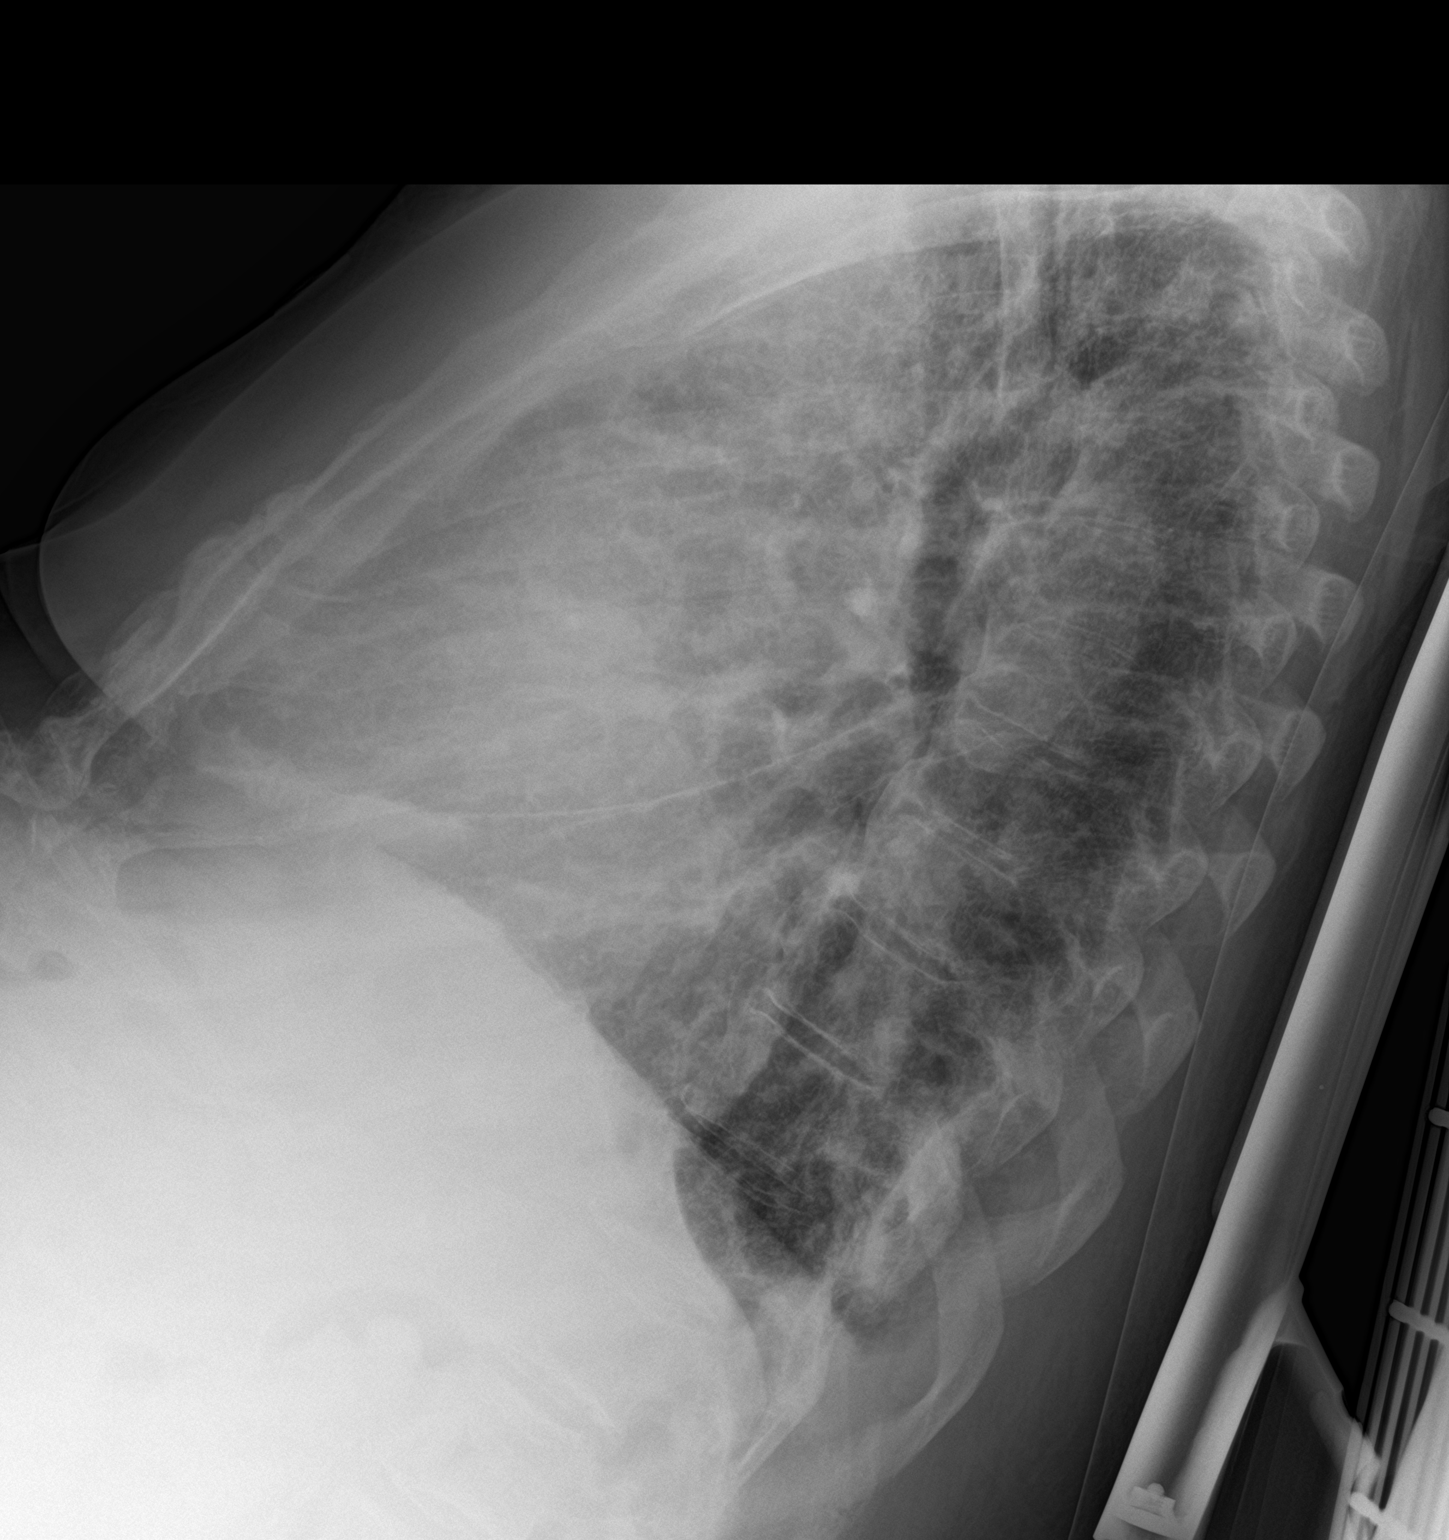

[2 of 2 positions shown; findings below may reference images not displayed]

FINDINGS: Trace pleural effusions. Cardiomegaly with vascular congestion and
diffuse bilateral interstitial and ground-glass opacity, suspect for
pulmonary edema. Patchy more confluent airspace disease at the
bases. No pneumothorax.
IMPRESSION: 1. Cardiomegaly with vascular congestion and pulmonary edema.
Probable trace pleural effusions.
2. Patchy more confluent airspace disease at the bases may reflect
atelectasis or superimposed pneumonia

## 2021-11-24 IMAGING — DX DG CHEST 2V
2 series · 2 of 2 positions shown · non-contrast
Comparison: 11/21/2019

CLINICAL DATA: Shortness of breath.

EXAM:
CHEST - 2 VIEW

[chest lat]
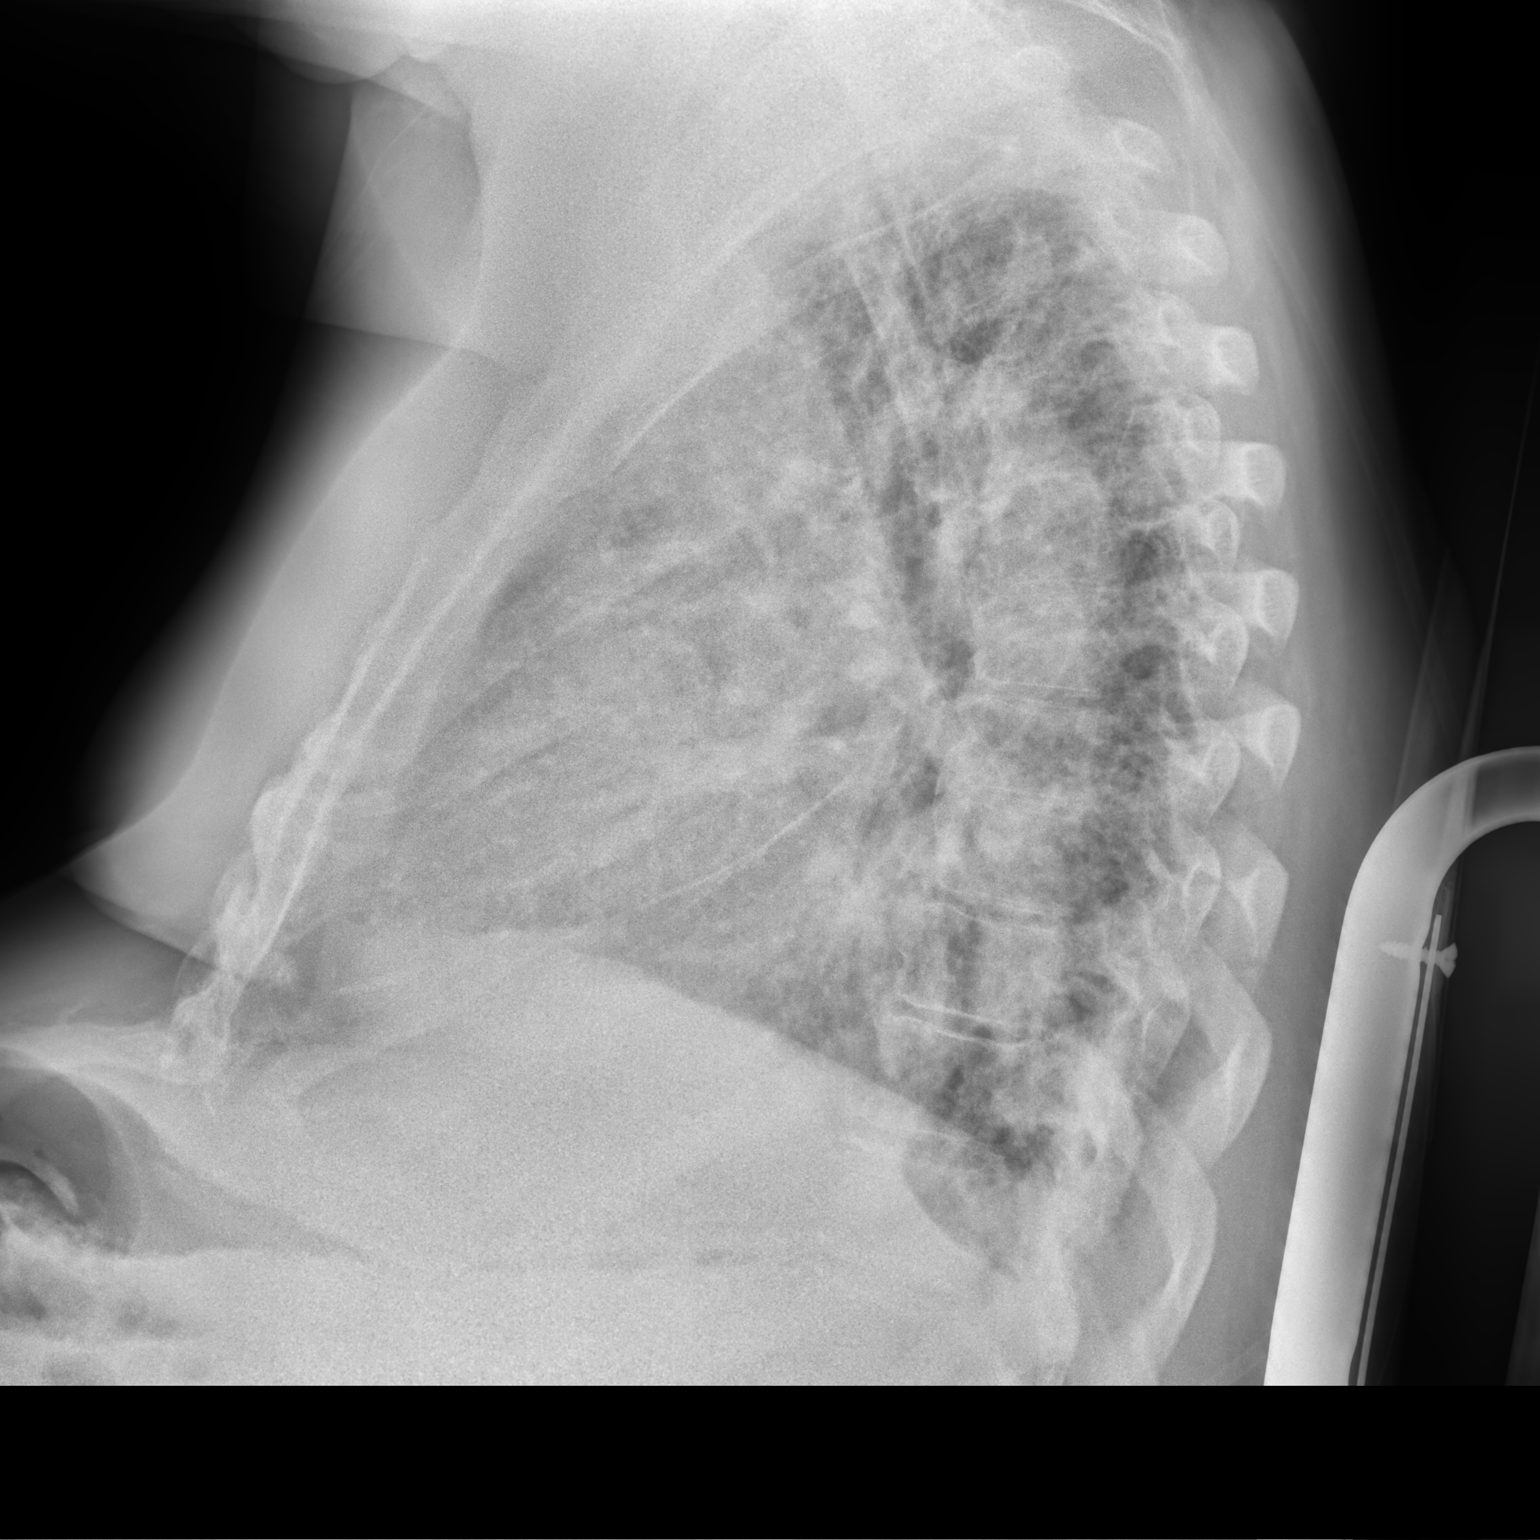

[chest ap]
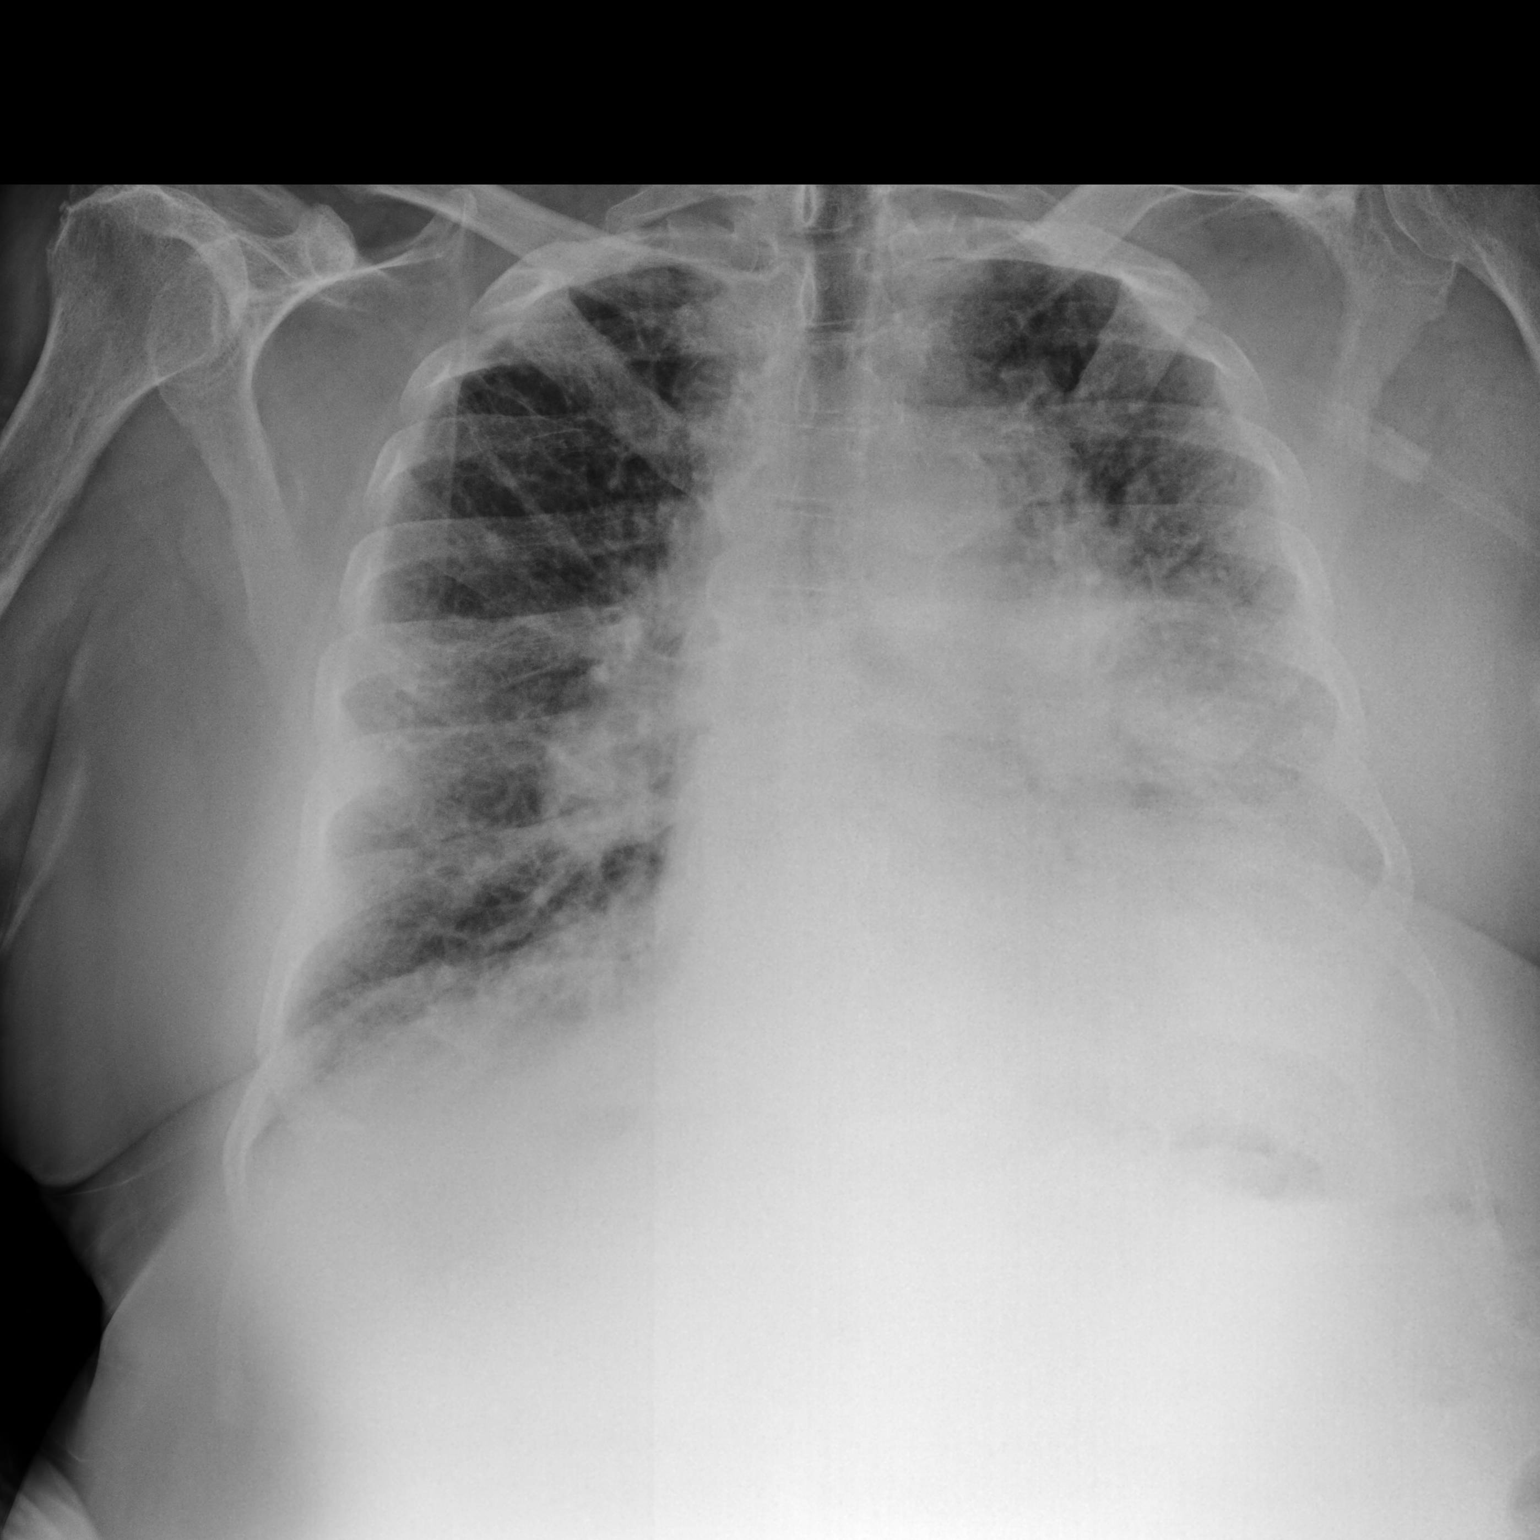

[2 of 2 positions shown; findings below may reference images not displayed]

FINDINGS: The heart is enlarged but stable. The mediastinal and hilar contours
are prominent but not significantly changed.

Central vascular congestion and asymmetric pattern of pulmonary
edema. Underlying emphysematous changes.
IMPRESSION: Cardiac enlargement, chronic lung changes and probable superimposed
CHF.

## 2022-02-28 IMAGING — DX DG CHEST 1V PORT
1 series · 1 of 1 positions shown · non-contrast
Comparison: 06/21/2020

CLINICAL DATA: Shortness of breath

EXAM:
PORTABLE CHEST 1 VIEW

[chest ap]
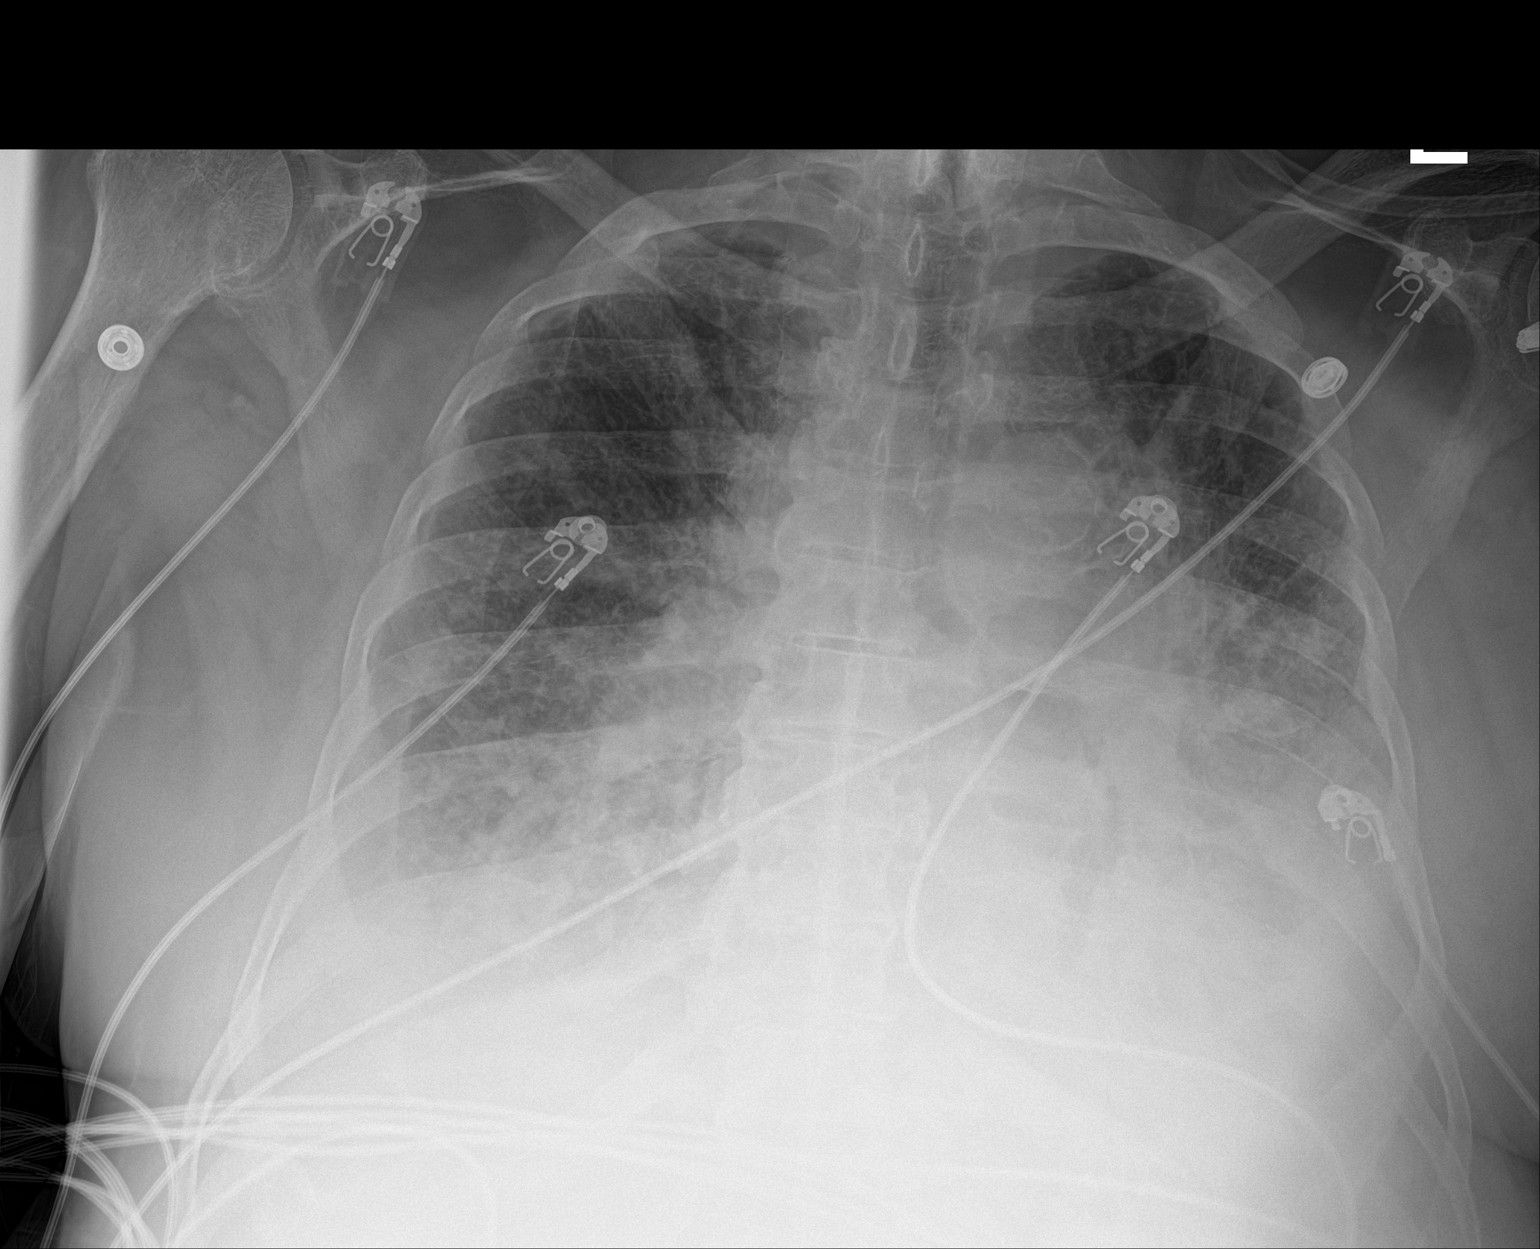

[1 of 1 positions shown; findings below may reference images not displayed]

FINDINGS: Cardiomegaly. Atherosclerotic calcification of the aortic knob.
Pulmonary vascular congestion with diffuse bilateral interstitial
prominence. Patchy bibasilar opacities. Small bilateral pleural
effusions. No pneumothorax. Degenerative changes of the right
shoulder.
IMPRESSION: 1. Findings suggestive of CHF with pulmonary edema. A superimposed
infectious process would be difficult to exclude.
2. Small bilateral pleural effusions.

## 2022-03-08 IMAGING — US US RENAL
1 series · 14 of 25 positions shown · non-contrast
Comparison: 08/28/2019, 01/03/2019

CLINICAL DATA: Acute renal insufficiency

EXAM:
RENAL / URINARY TRACT ULTRASOUND COMPLETE

[Series 1: us renal · 14 of 34 slices shown]
[im 1/34]
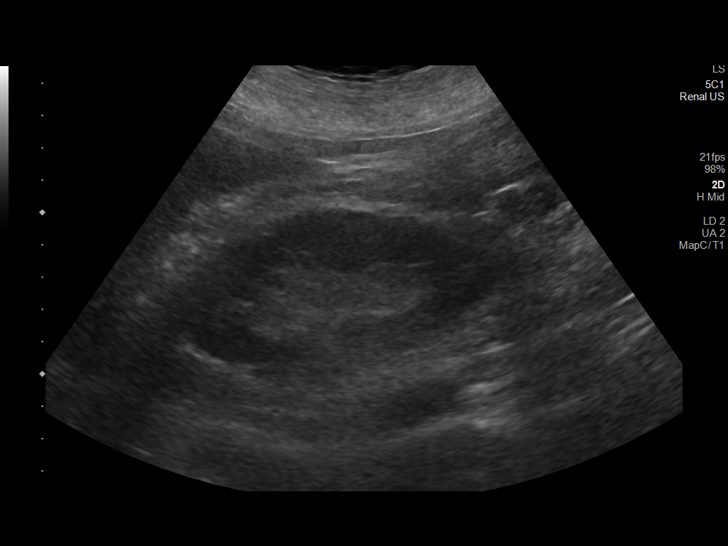
[im 3/34]
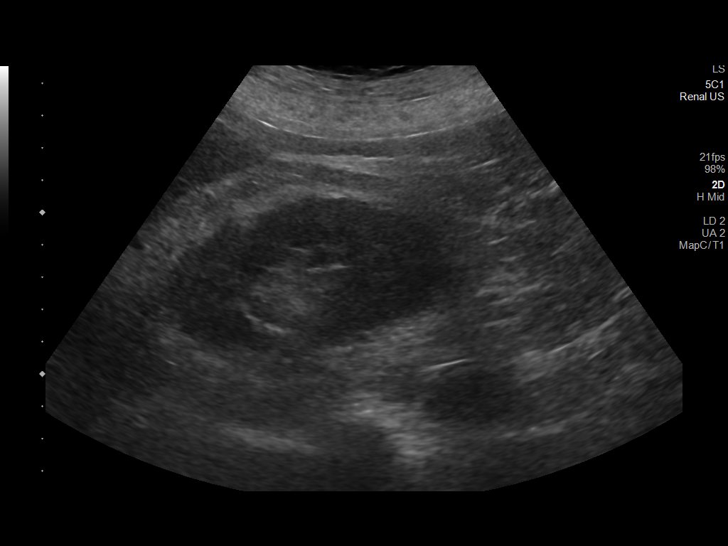
[im 6/34]
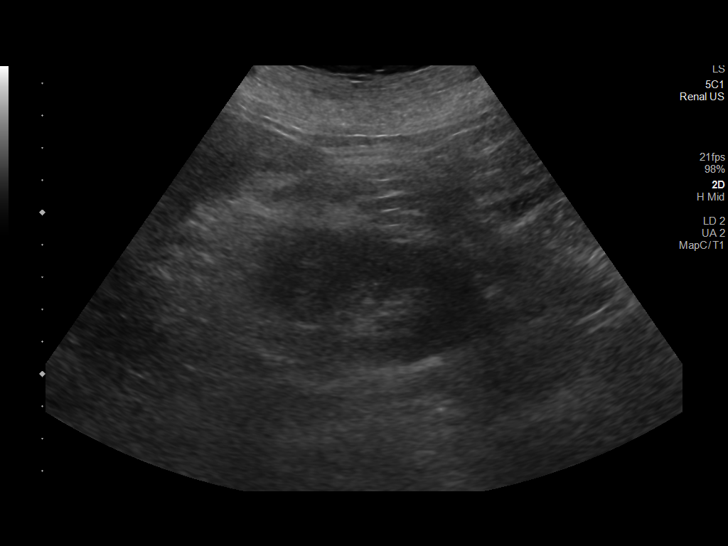
[im 9/34]
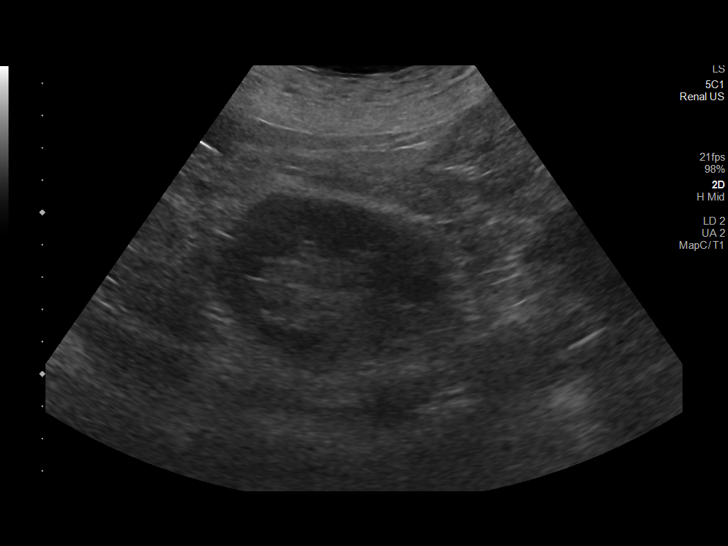
[im 12/34]
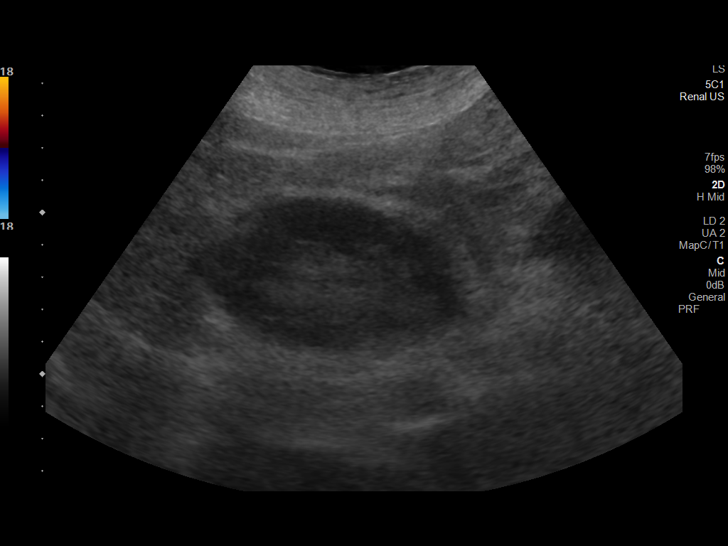
[im 13/34]
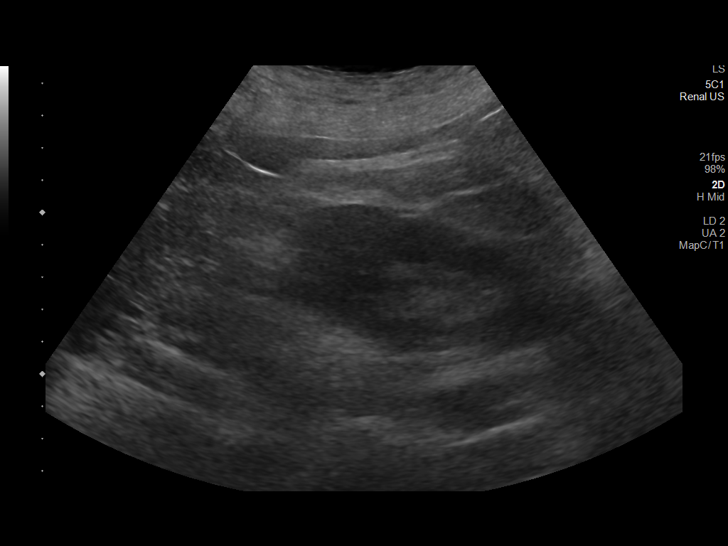
[im 16/34]
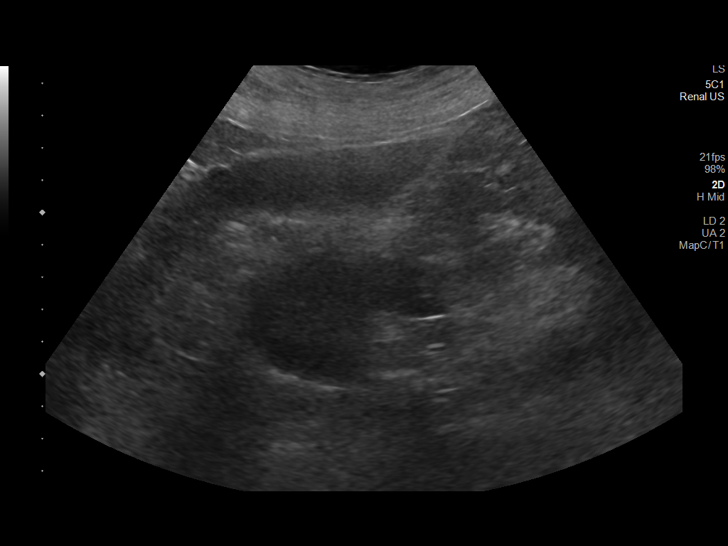
[im 18/34]
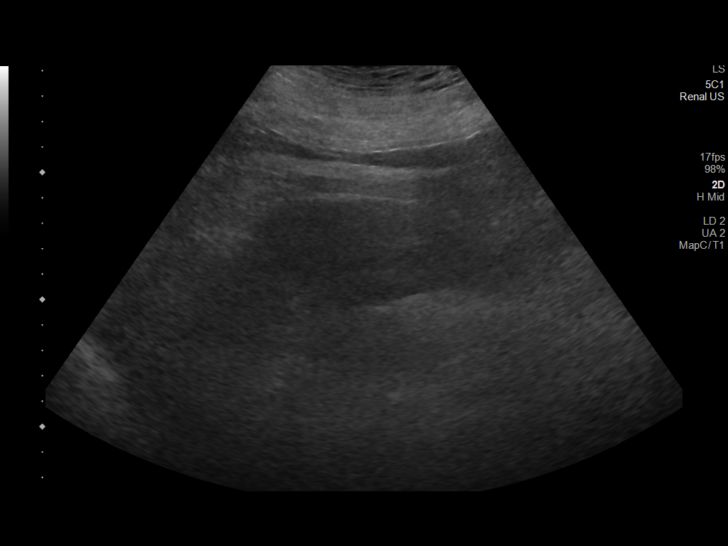
[im 21/34]
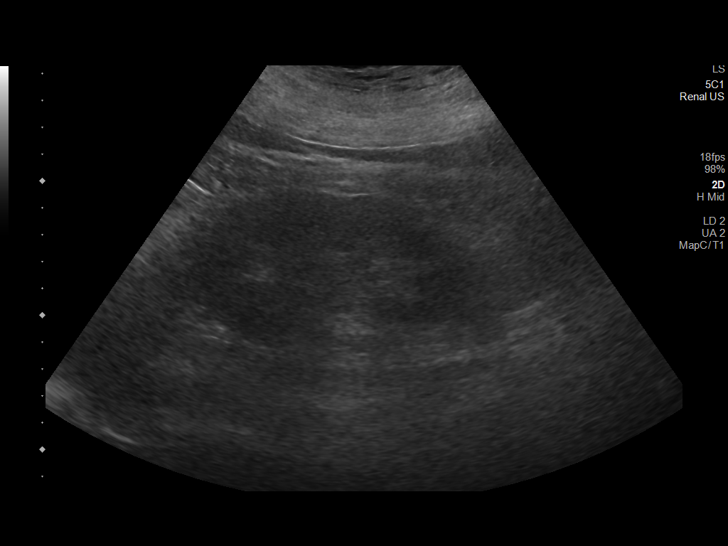
[im 23/34]
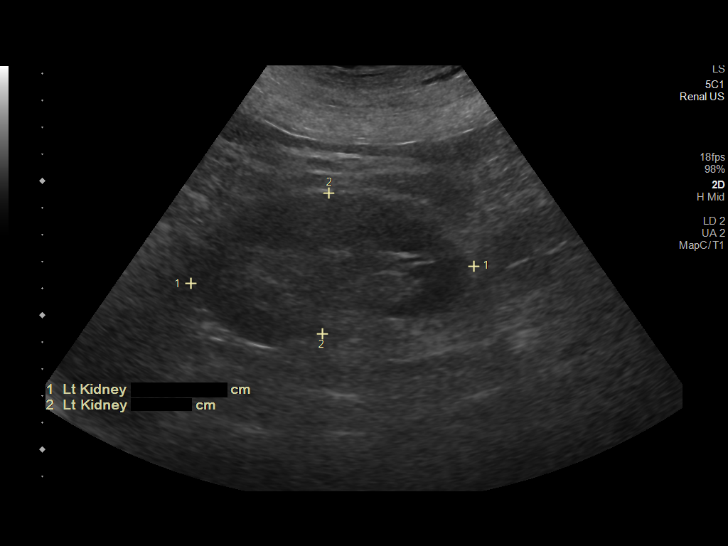
[im 25/34]
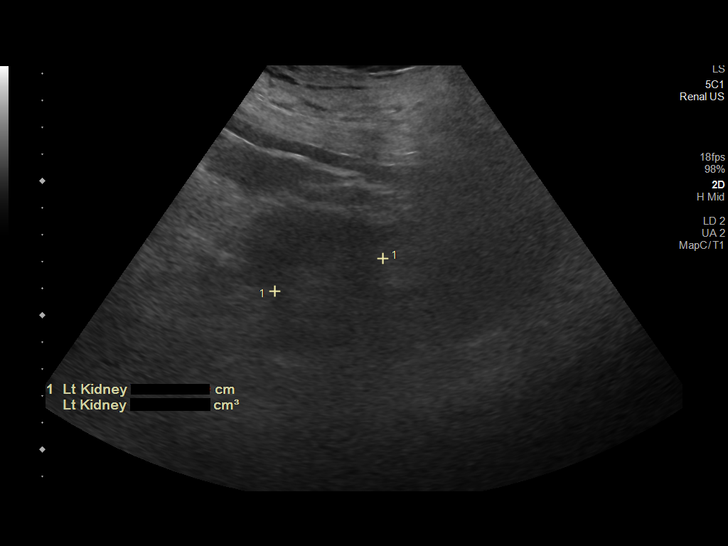
[im 28/34]
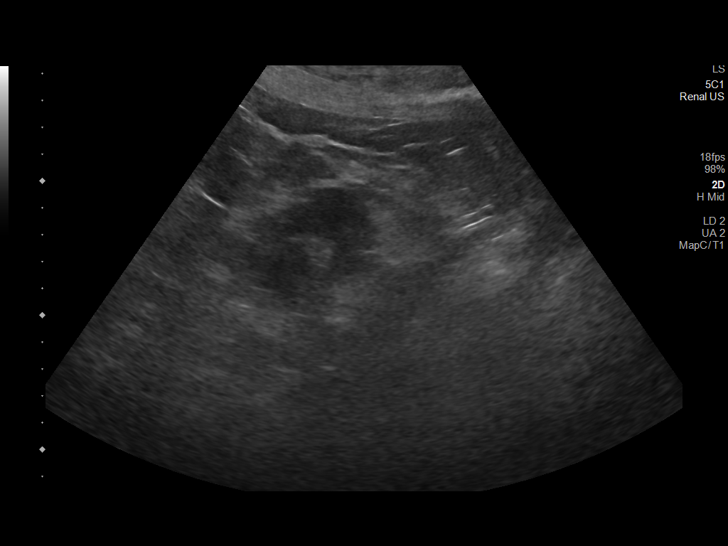
[im 31/34]
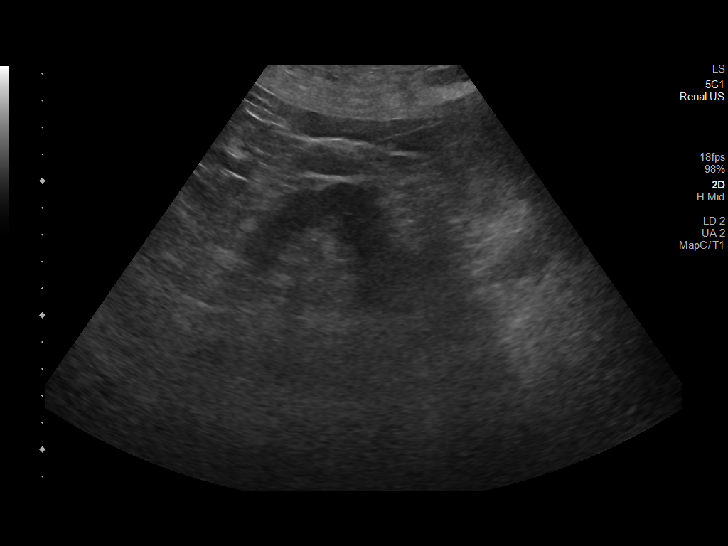
[im 34/34]
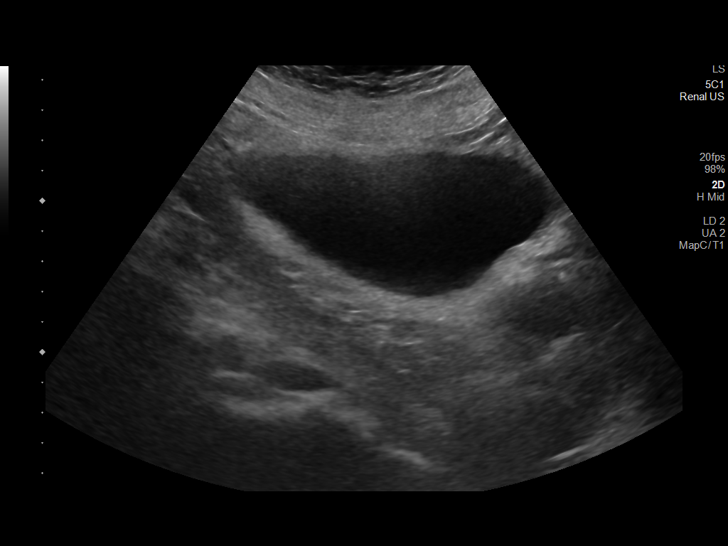

[14 of 25 positions shown; findings below may reference images not displayed]

FINDINGS: Right Kidney:

Renal measurements: 10.9 x 5.2 x 4.5 cm = volume: 133.3 mL.
Echogenicity within normal limits. No mass or hydronephrosis
visualized.

Left Kidney:

Renal measurements: 10.4 x 5.0 x 4.6 cm = volume: 126.3 mL.
Echogenicity within normal limits. No mass or hydronephrosis
visualized.

Bladder:

Appears normal for degree of bladder distention.

Other:

None.
IMPRESSION: 1. Unremarkable renal ultrasound.

## 2022-03-09 IMAGING — CR DG ABDOMEN 1V
2 series · 2 of 2 positions shown · non-contrast
Comparison: 08/27/2019

CLINICAL DATA: Generalized abdominal pain for 1 day

EXAM:
ABDOMEN - 1 VIEW

[abdomen kub (1 of 2)]
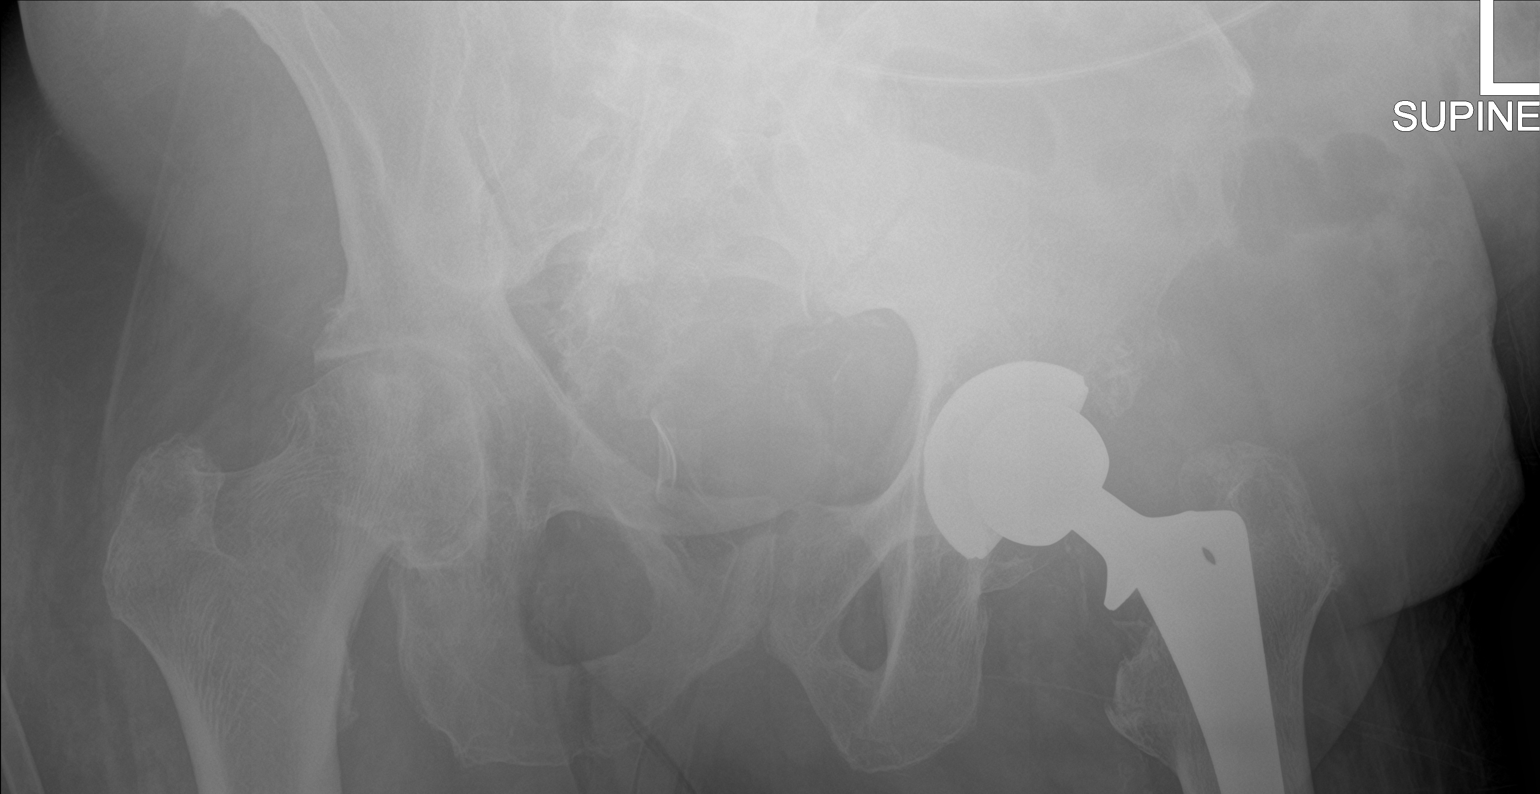

[abdomen kub (2 of 2)]
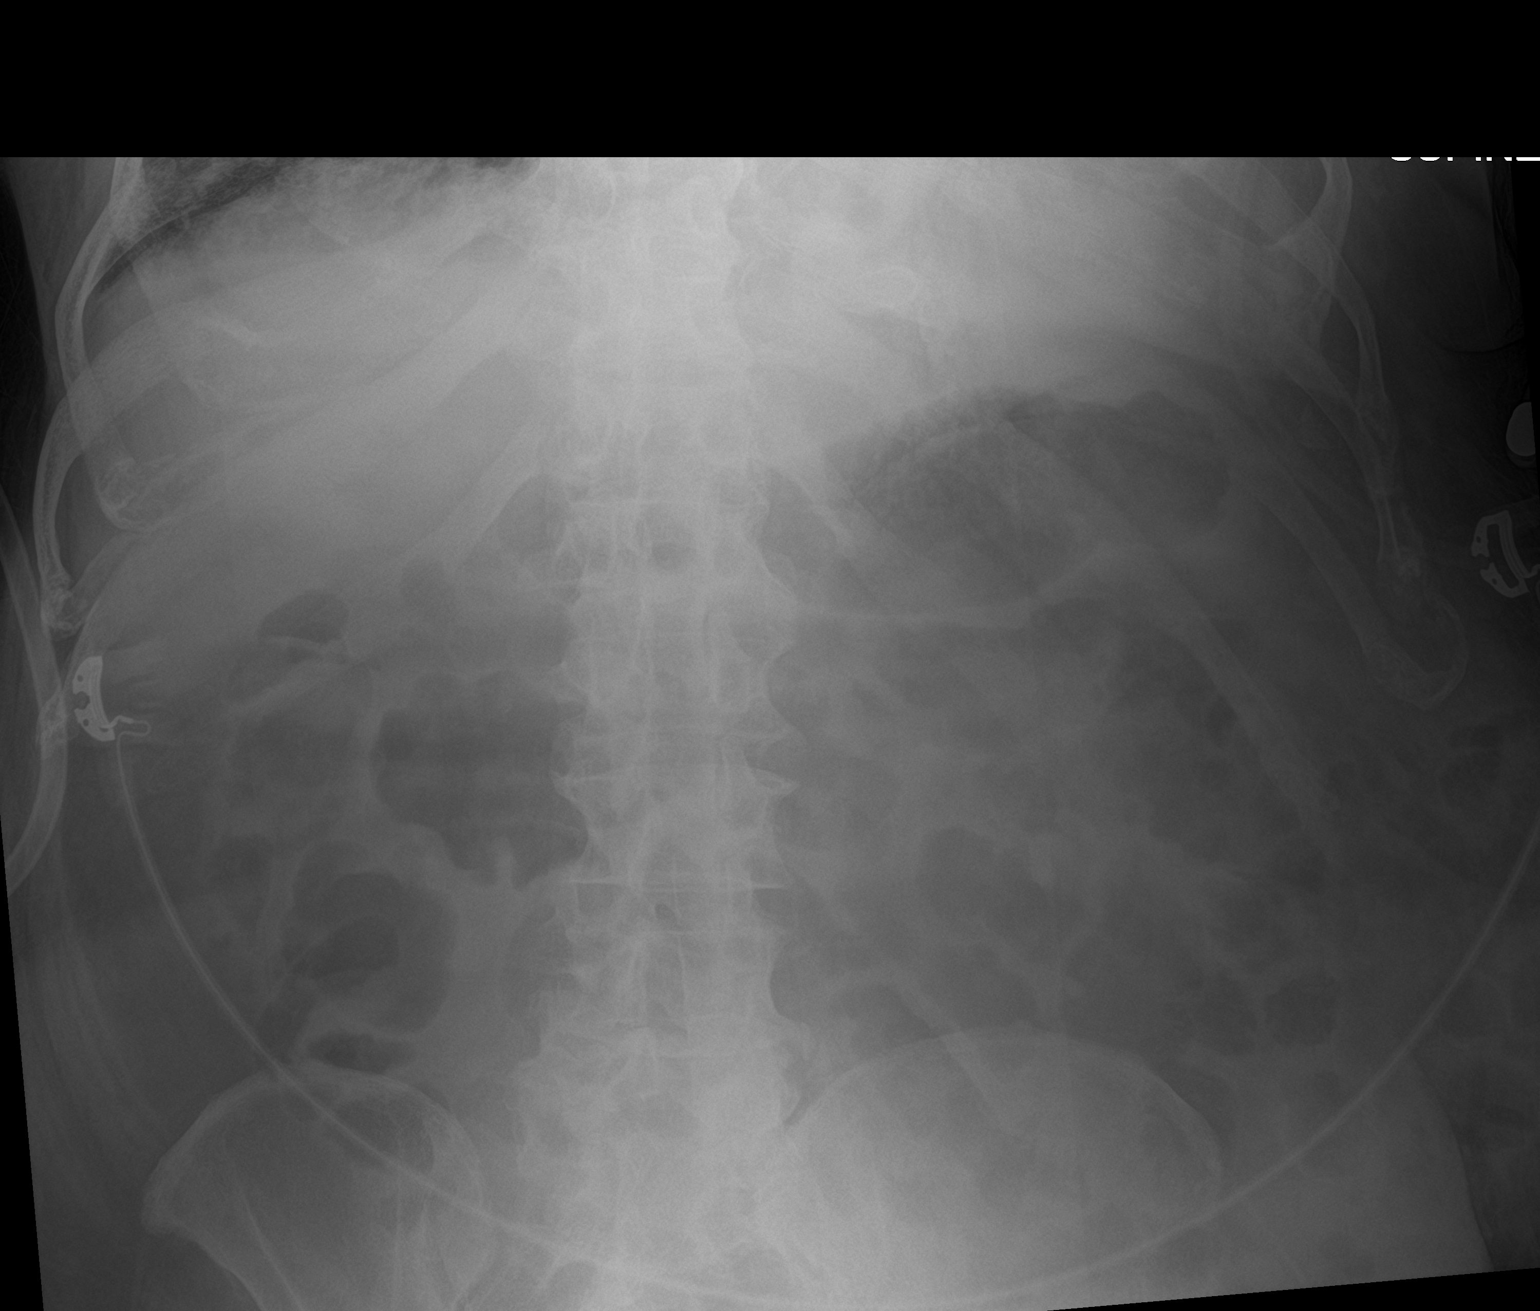

[2 of 2 positions shown; findings below may reference images not displayed]

FINDINGS: Scattered large and small bowel gas is noted. No obstructive changes
are seen. No free air is noted. Left hip replacement is again noted
and stable. No acute bony abnormality is seen. Mild degenerative
changes of lumbar spine are.
IMPRESSION: No acute abnormality in the abdomen.

## 2022-03-10 IMAGING — DX DG CHEST 1V PORT
2 series · 2 of 2 positions shown · non-contrast
Comparison: 09/25/2020

CLINICAL DATA: Respiratory failure.  Fever

EXAM:
PORTABLE CHEST 1 VIEW

[chest ap (1 of 2)]
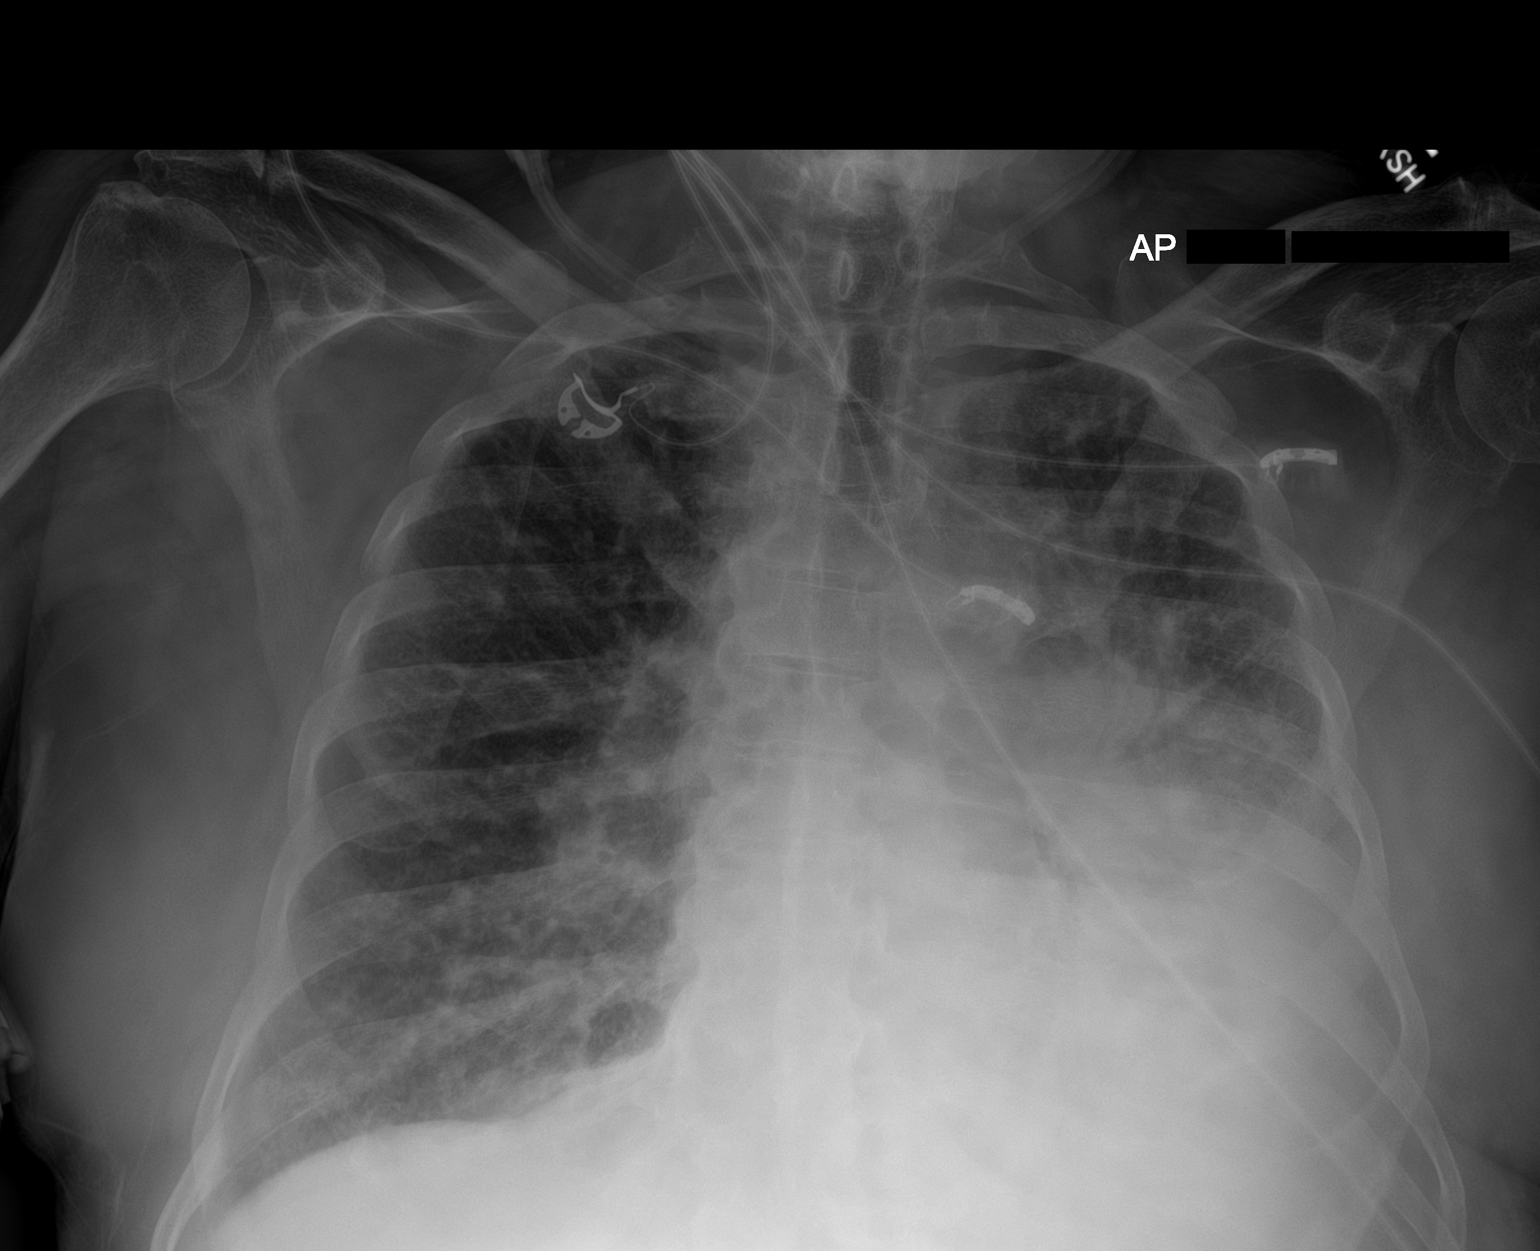

[chest ap (2 of 2)]
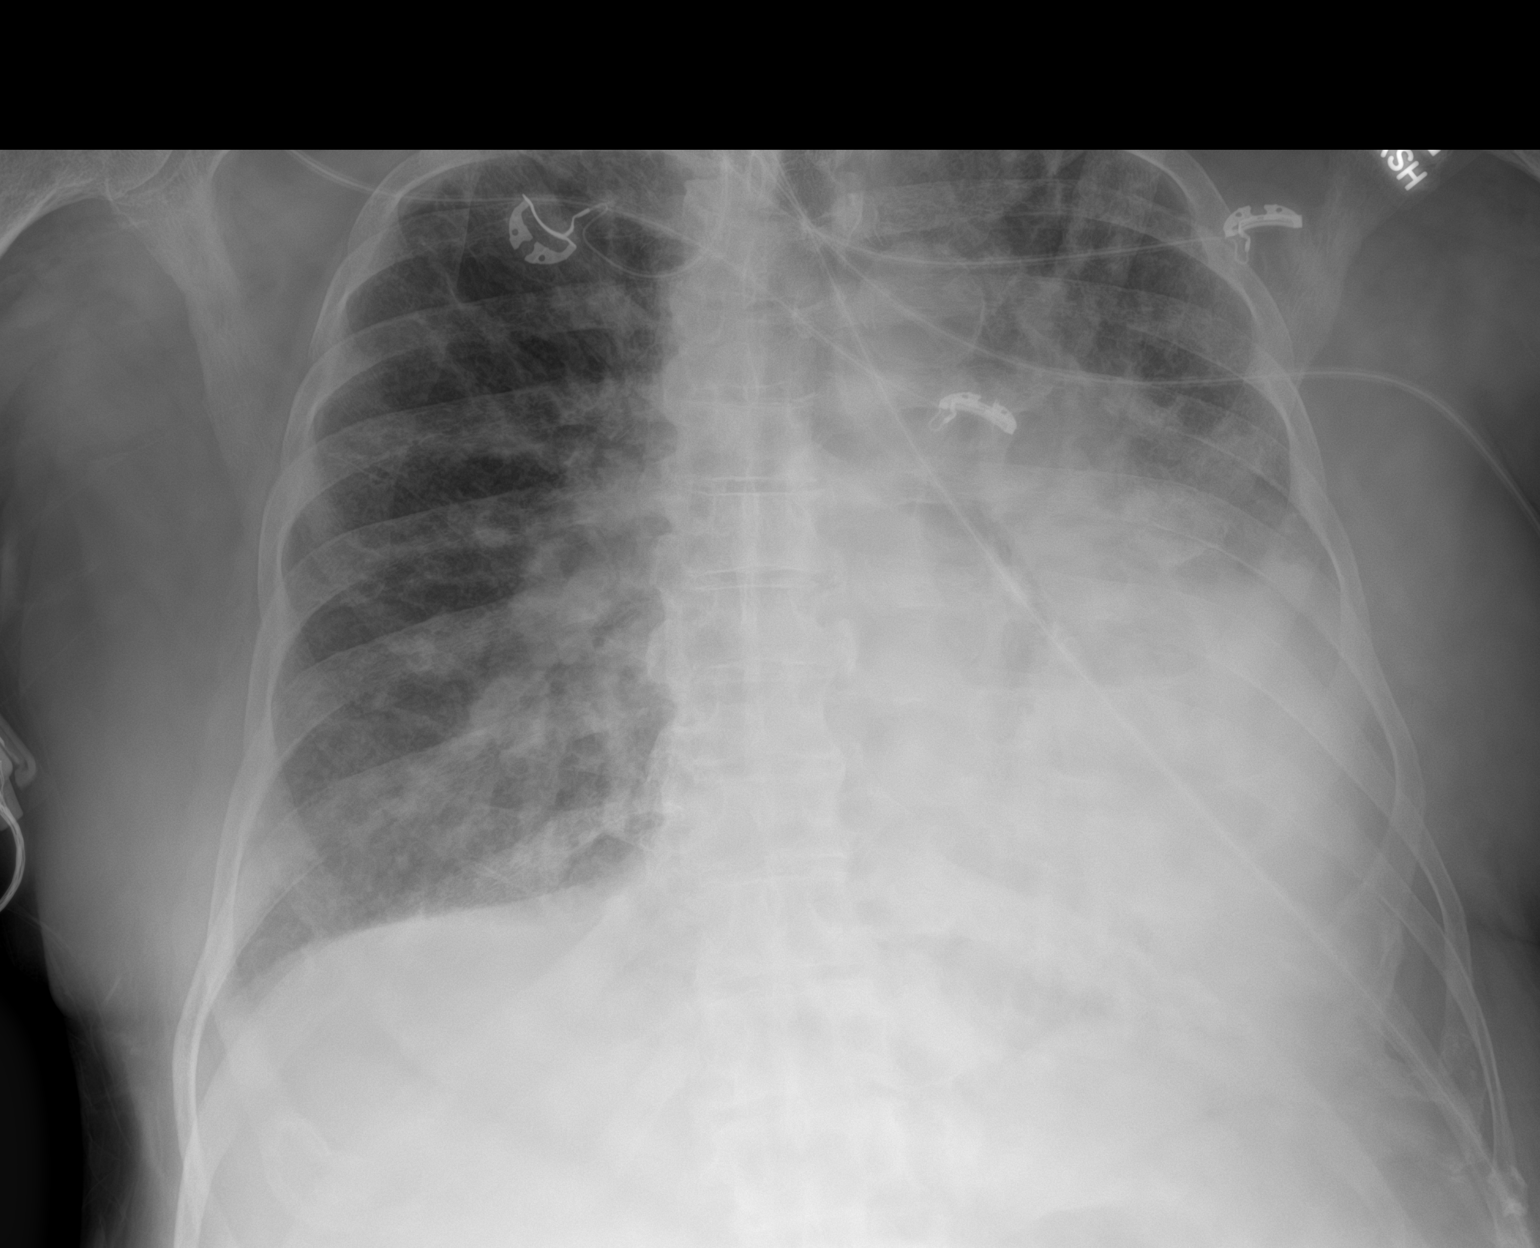

[2 of 2 positions shown; findings below may reference images not displayed]

FINDINGS: Improvement in right lower lobe airspace disease and right effusion.

Moderate left lower lobe airspace disease unchanged. Pulmonary
vascularity normal.
IMPRESSION: Improvement in right lower lobe airspace disease. No change in
moderate left lower lobe airspace disease.

## 2022-03-10 IMAGING — CT CT ABDOMEN W/O CM
2 of 5 series · 14 of 46 positions shown, 16 images · non-contrast
Comparison: CT chest without contrast 01/03/2019. CT of the abdomen
and pelvis without contrast 08/28/2019

CLINICAL DATA: Upper abdominal and lower chest mass.

EXAM:
CT CHEST AND ABDOMEN WITHOUT CONTRAST
TECHNIQUE: Multidetector CT imaging of the chest and abdomen was performed
following the standard protocol without intravenous contrast.

[Series 5: cap w/o 2.0 mm st · axial · non-contrast · 0.98mm/px · z∈[+1058,+1450]mm · 11 of 230 slices shown, 13 images]
[im 17/230  soft-tissue]
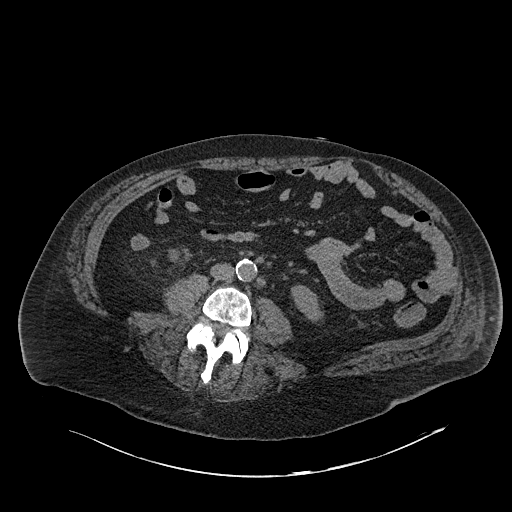
[im 17/230  bone]
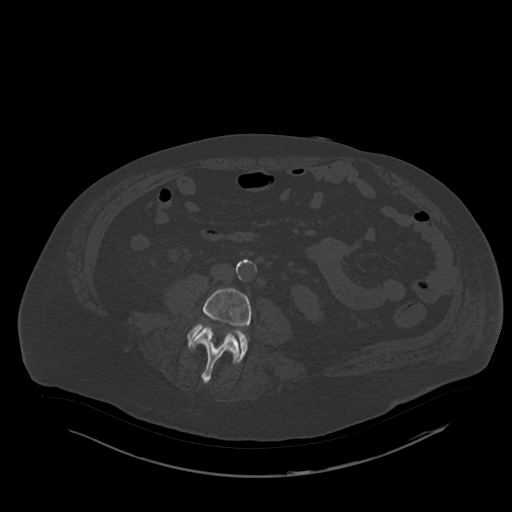
[im 33/230  soft-tissue]
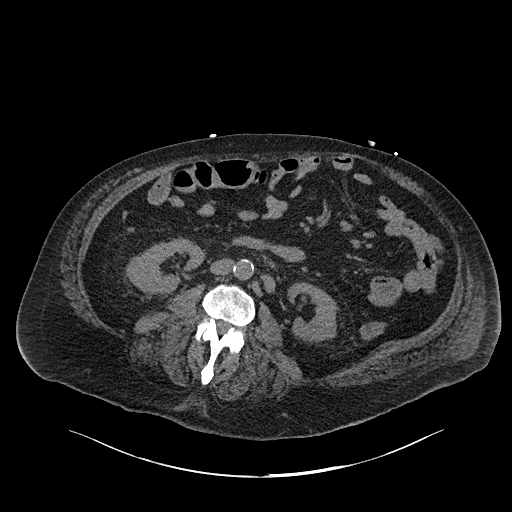
[im 50/230  soft-tissue]
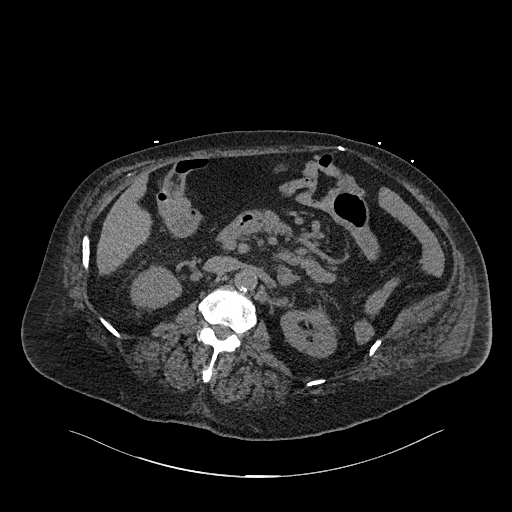
[im 82/230  soft-tissue]
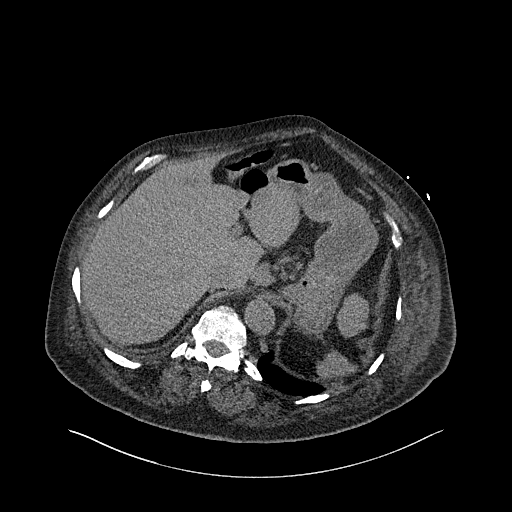
[im 99/230  soft-tissue]
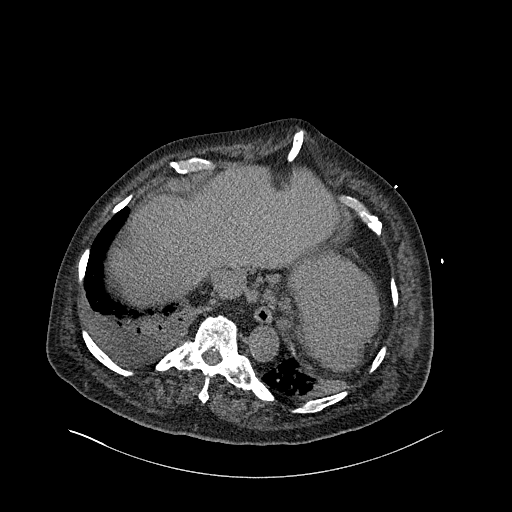
[im 115/230  soft-tissue]
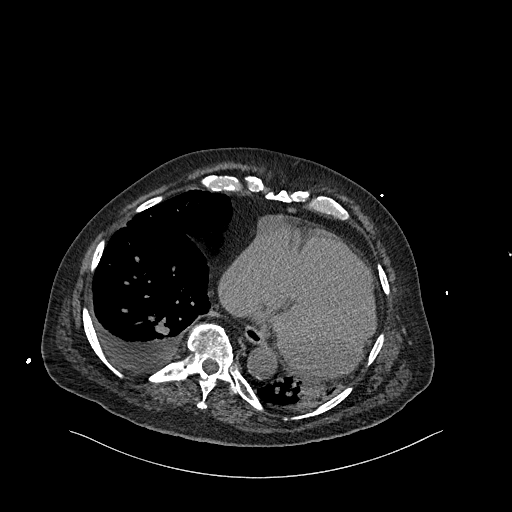
[im 131/230  soft-tissue]
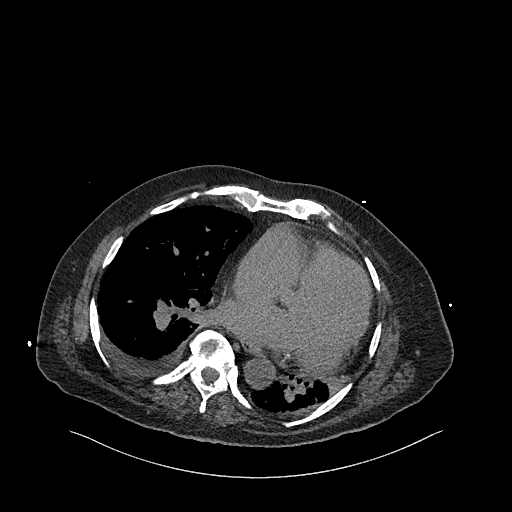
[im 148/230  soft-tissue]
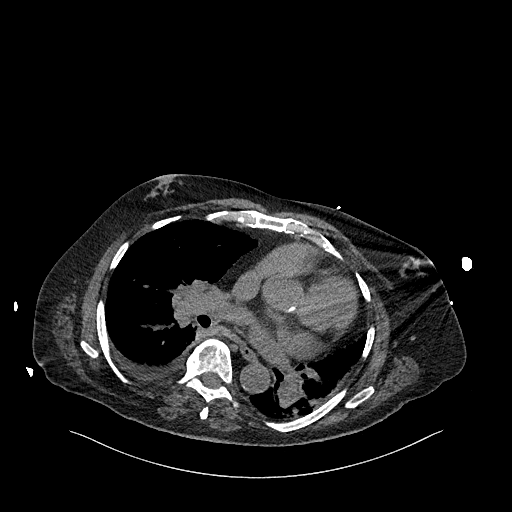
[im 180/230  soft-tissue]
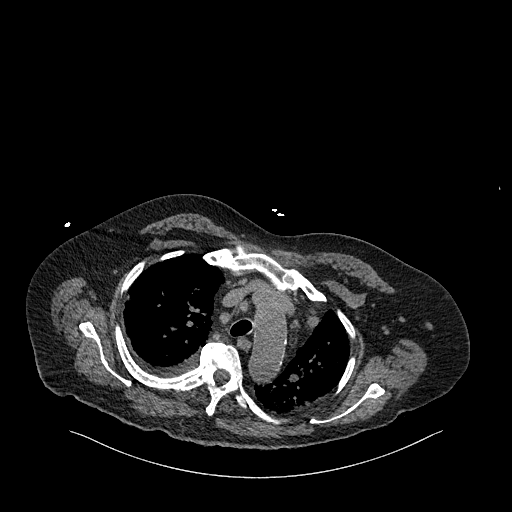
[im 180/230  bone]
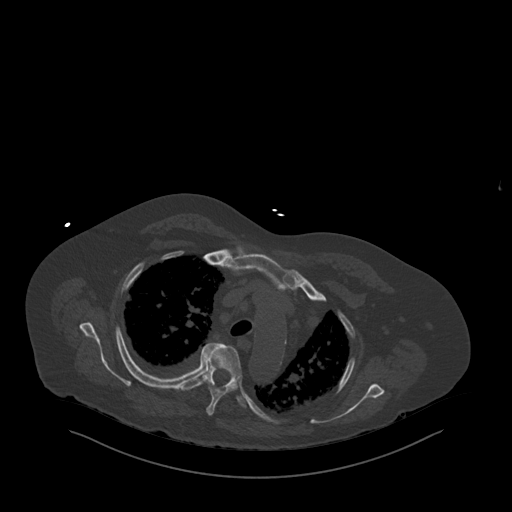
[im 197/230  soft-tissue]
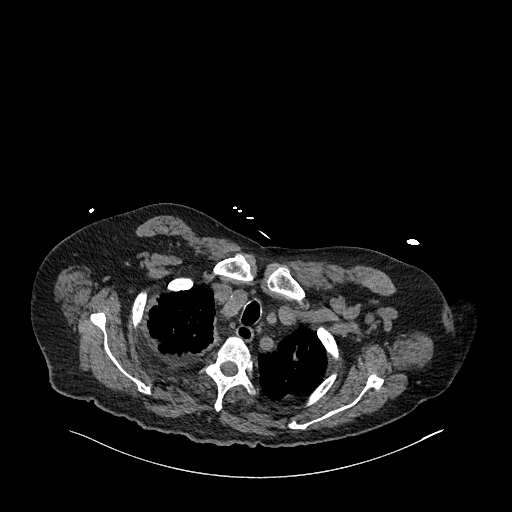
[im 213/230  soft-tissue]
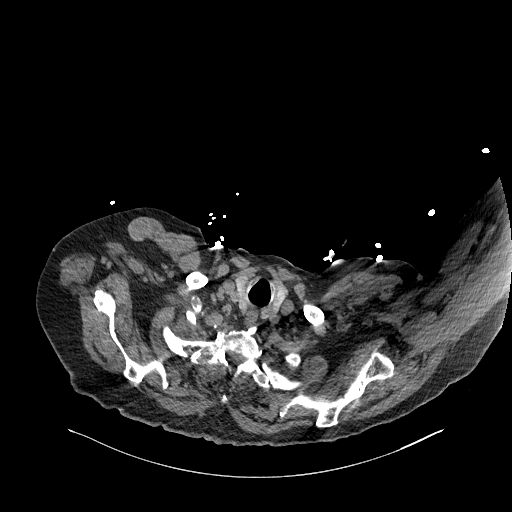

[Series 7: cap w/o 3.0 mm st cor · coronal · non-contrast · 0.90mm/px · 3 of 122 slices shown]
[im 41/122  soft-tissue]
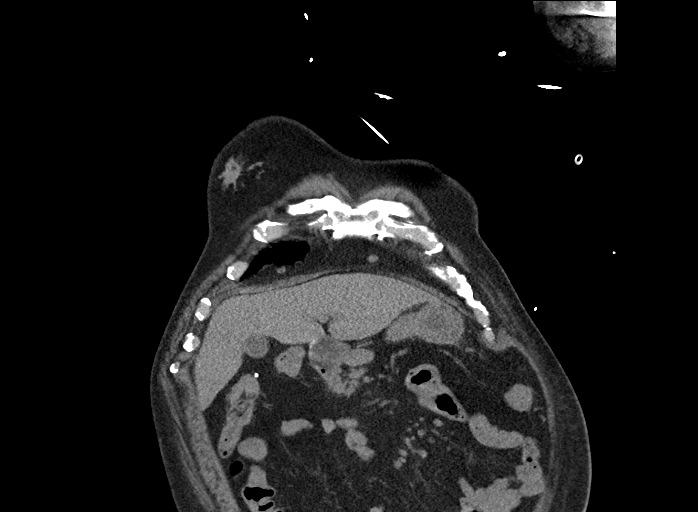
[im 54/122  soft-tissue]
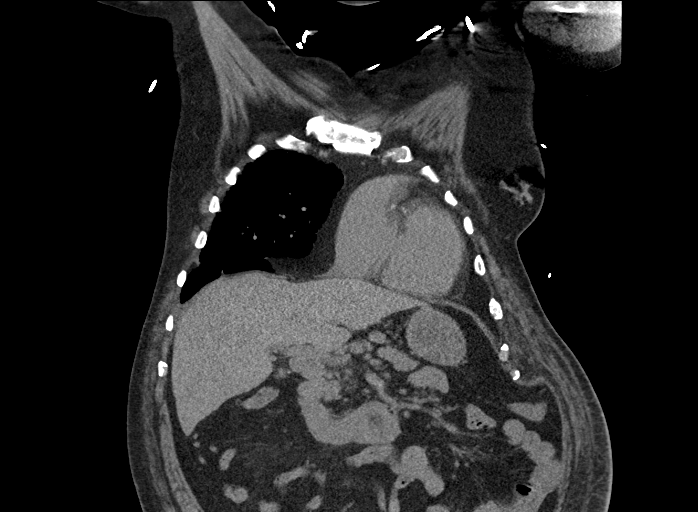
[im 68/122  soft-tissue]
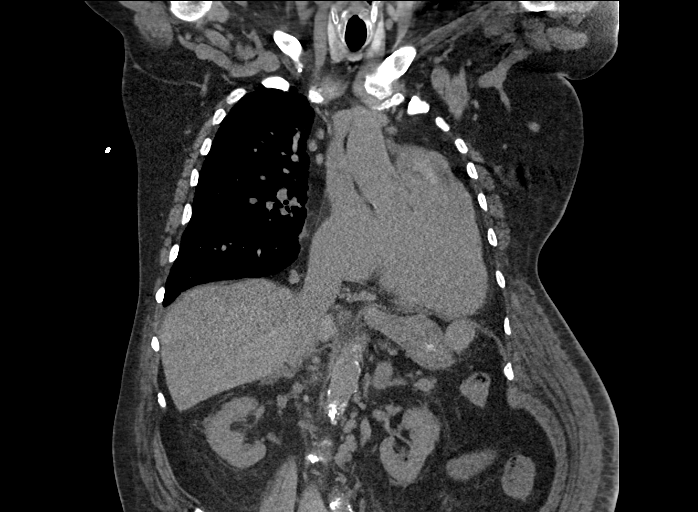

[14 of 46 positions shown; findings below may reference images not displayed]

FINDINGS: CT CHEST FINDINGS WITHOUT CONTRAST

Cardiovascular: Heart is enlarged, stable. Coronary artery
calcifications are present. Atherosclerotic changes are present in
the aorta without aneurysm or focal stenosis.

Mediastinum/Nodes: Multiple paratracheal and prevascular lymph nodes
are similar the prior exam. Bilateral hilar adenopathy is similar
the prior exam. Esophagus is within normal limits. Thoracic inlet is
normal.

Lungs/Pleura: Paraseptal emphysematous changes are again noted.
Bilateral pleural effusions are present with associated atelectasis.
New ill-defined airspace disease is present in the lateral aspect of
the right upper lobe on image 53 of series 4. Previously noted 3 mm
nodule is present on image 57. Areas of dependent atelectasis
present in the lower lobes bilaterally. Other scattered areas of
ground-glass attenuation are present in the anterior right middle
lobe. The airways are patent. 5.5 cm left upper lobe pulmonary
nodule is stable. Additional scarring is present both lung apices.

Musculoskeletal: Prominent anterior xiphoid process tenting the soft
tissues is stable. Ribs are unremarkable. Ankylosis of the lower
thoracic spine is noted. No acute osseous abnormality is present.
Gynecomastia is noted.

CT ABDOMEN FINDINGS WITHOUT CONTRAST

Hepatobiliary: No focal liver abnormality is seen. No gallstones,
gallbladder wall thickening, or biliary dilatation. Portal caval
lymph nodes are increasing in size. Largest node measures 2.4 cm in
short axis.

Pancreas: Soft tissue nodule anterior to the proximal body the
pancreas on image 65 measuring 2.7 x 2.2 cm is consistent with a
prominent enlarged lymph node. Pancreas is otherwise unremarkable.

Spleen: Splenules are noted.

Adrenals/Urinary Tract: Stable left low-density adrenal lesion
present, consistent with adrenal adenoma there is slight stranding
about the right kidney without scratched at there is slight
stranding about both kidneys without obstruction or mass lesion.

Stomach/Bowel: The stomach is within normal limits. Duodenal
diverticulum is noted without focal inflammatory change. Visualized
small bowel is unremarkable. Visualized portions the colon are
within normal limits. Appendix is visualized and normal.

Vascular/Lymphatic: Atherosclerotic calcifications are present in
the aorta branch vessels without aneurysm.

Other: No abdominal wall hernia or abnormality. No abdominopelvic
ascites.

Musculoskeletal: Lumbar vertebral body heights are within normal
limits.
IMPRESSION: 1. New ill-defined airspace disease in the lateral aspect of the
right upper lobe is concerning for pneumonia. Other scattered areas
of ground-glass attenuation are likely related.
2. Stable 5.5 cm left upper lobe pulmonary nodule.
3. Stable mediastinal and hilar adenopathy.
4. Bilateral pleural effusions and associated atelectasis.
5. Cardiomegaly without failure.
6. Coronary artery disease.
7. Stable left adrenal adenoma.
8. Increased size of portacaval and peripancreatic lymph nodes.
These are most likely reactive. Recommend short-term follow-up CT of
the abdomen and pelvis at 3-6 months.
9. Aortic Atherosclerosis ([id number]) and Emphysema ([id number]).

## 2022-04-03 IMAGING — DX DG CHEST 1V PORT
1 series · 1 of 1 positions shown · non-contrast
Comparison: 10/05/2020 CT, chest x-ray 09/25/2020, 06/21/2020,
07/07/2019

CLINICAL DATA: Shortness of breath

EXAM:
PORTABLE CHEST 1 VIEW

[chest]
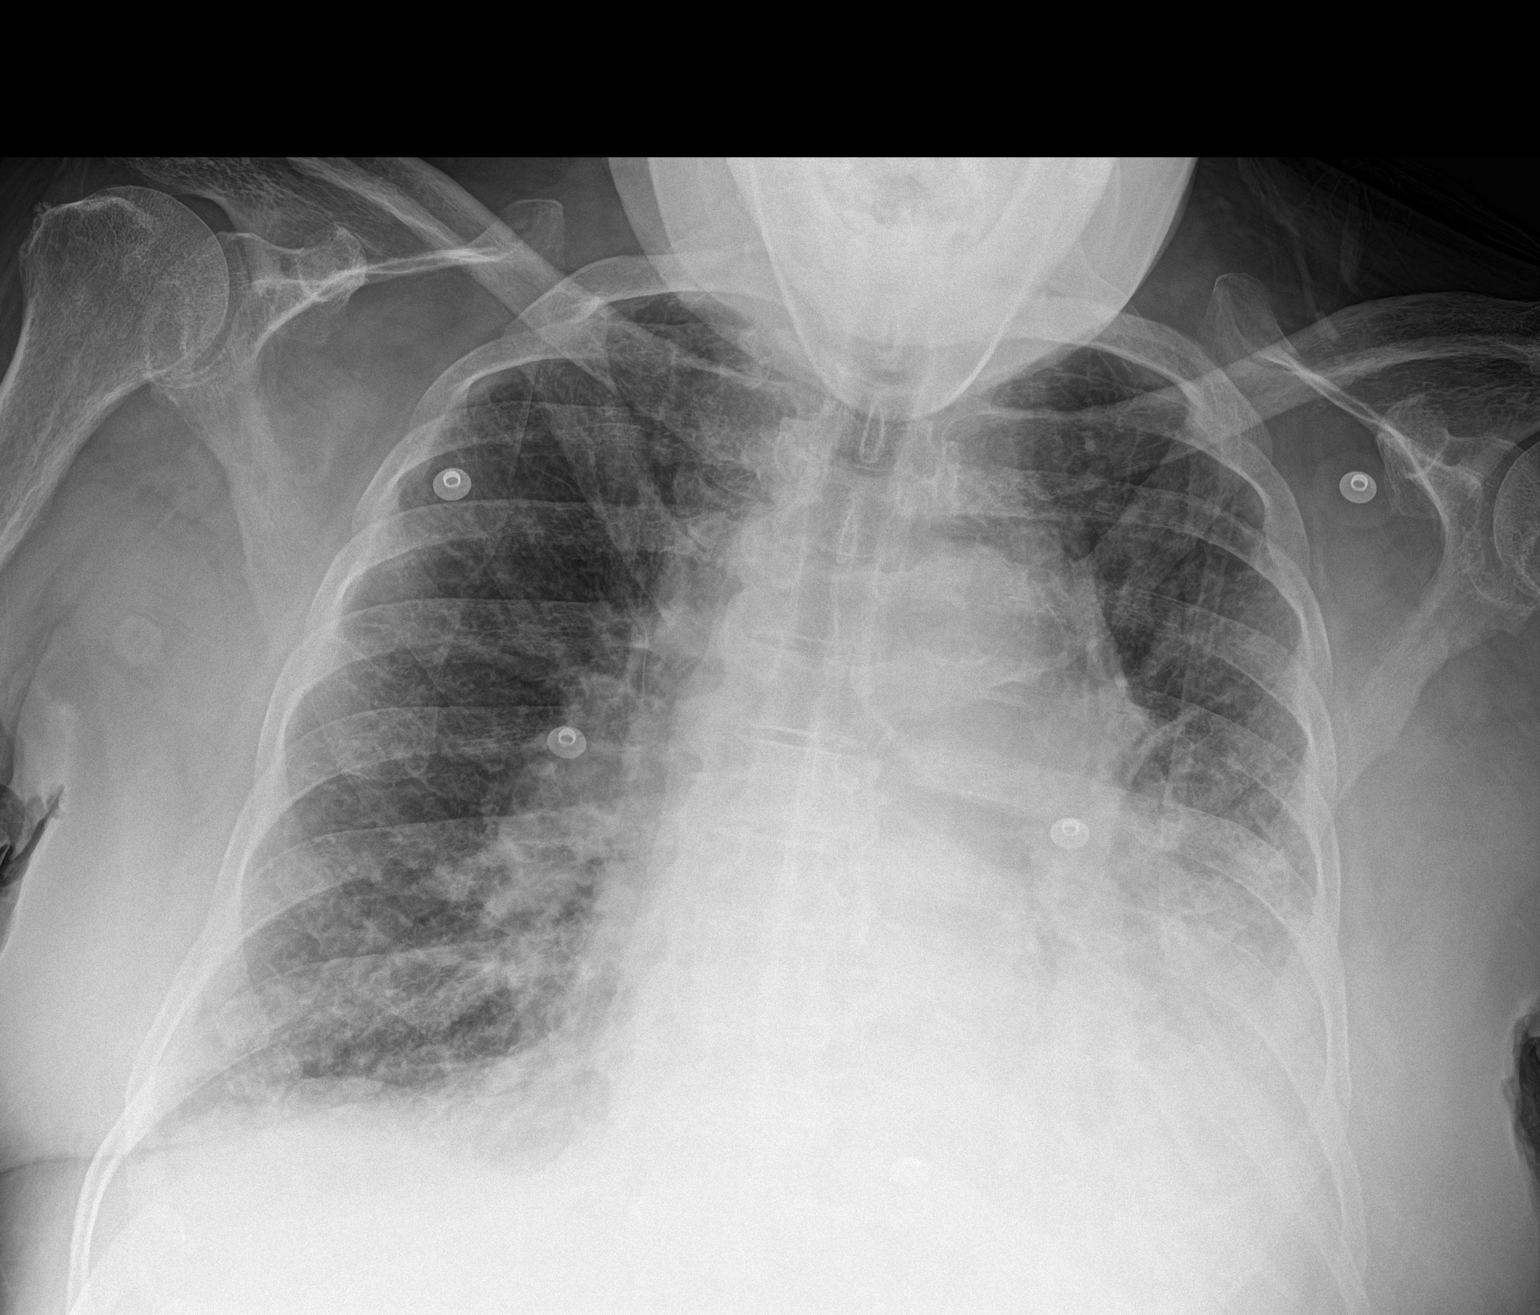

[1 of 1 positions shown; findings below may reference images not displayed]

FINDINGS: Cardiomegaly with vascular congestion and mild edema. Small pleural
effusions. Persistent airspace disease at the left lung base. Aortic
atherosclerosis.
IMPRESSION: Cardiomegaly with vascular congestion, mild edema and small pleural
effusions. Persistent airspace disease at the left base, some of
which appears chronic.

## 2022-04-26 IMAGING — DX DG CHEST 1V PORT
1 series · 1 of 1 positions shown · non-contrast
Comparison: 10/29/2020, 08/08/2020, 08/14/2019

CLINICAL DATA: Shortness of breath

EXAM:
PORTABLE CHEST 1 VIEW

[chest ap]
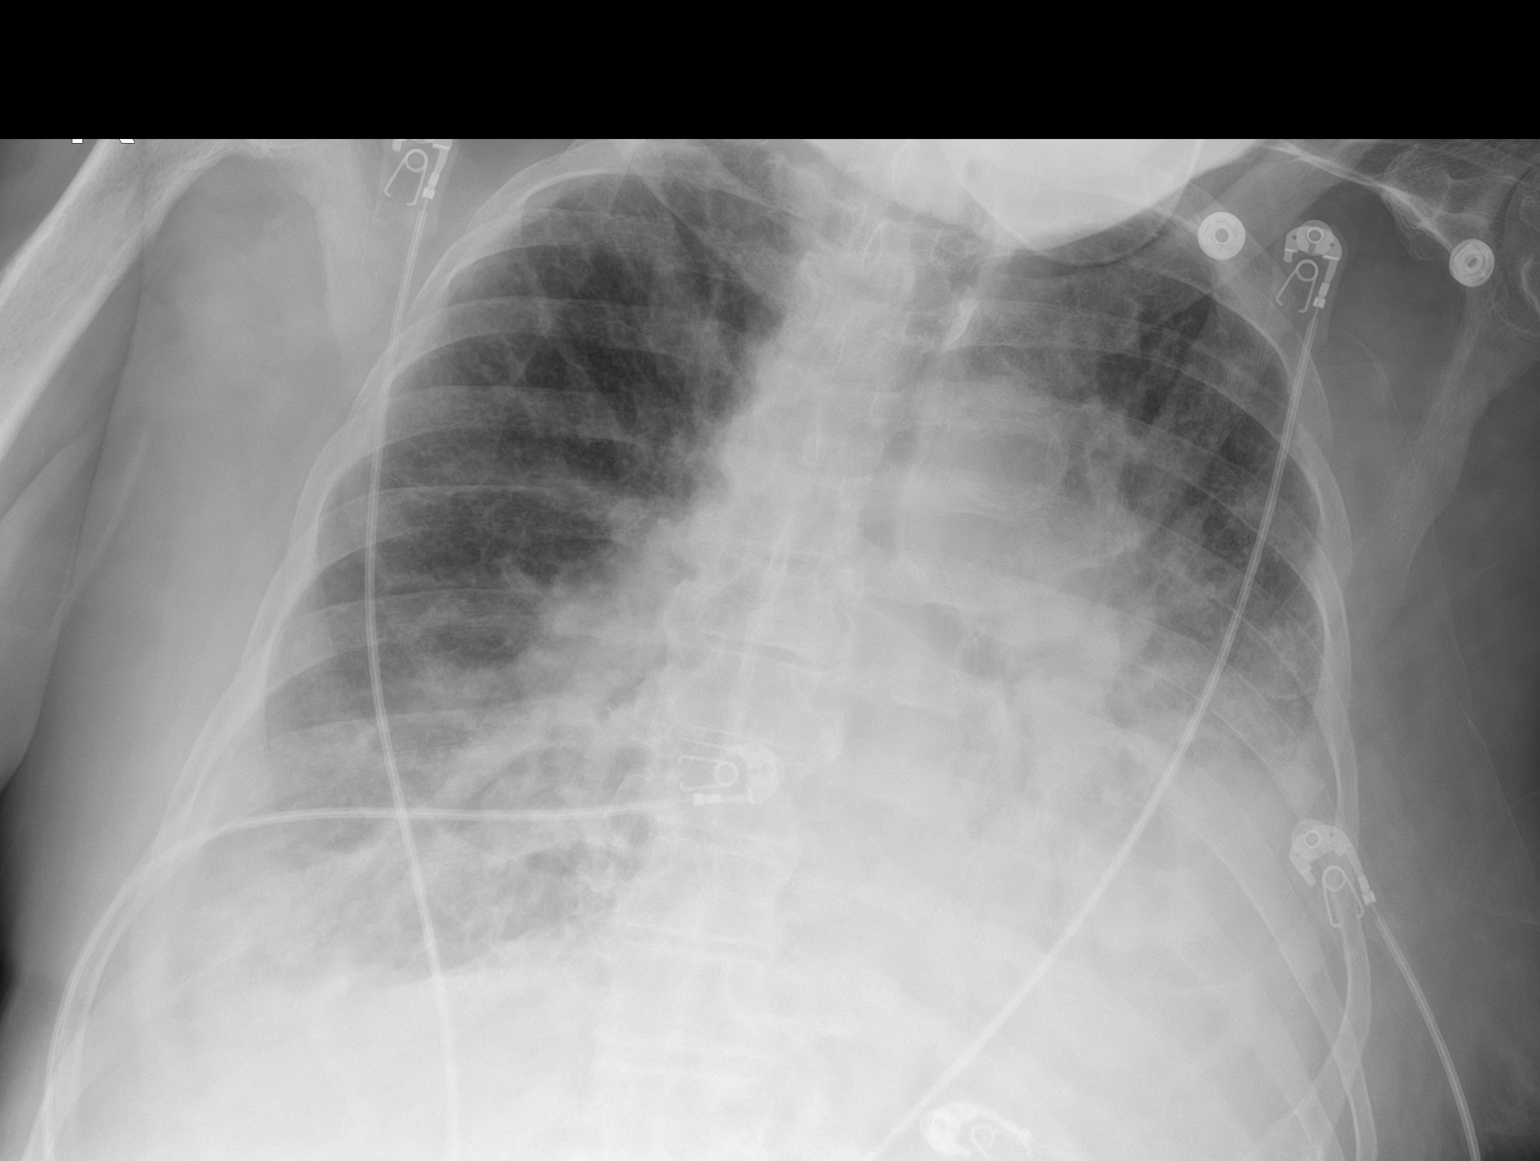

[1 of 1 positions shown; findings below may reference images not displayed]

FINDINGS: Cardiomegaly with vascular congestion. Increasing bilateral
effusions and lower lung airspace disease. No pneumothorax
IMPRESSION: Cardiomegaly with vascular congestion. Worsening bilateral effusions
and left greater than right basilar airspace disease which may
reflect pneumonia.

## 2022-05-16 IMAGING — DX DG CHEST 1V PORT
1 series · 1 of 1 positions shown · non-contrast
Comparison: 11/21/2020

CLINICAL DATA: Weakness

EXAM:
PORTABLE CHEST 1 VIEW

[chest]
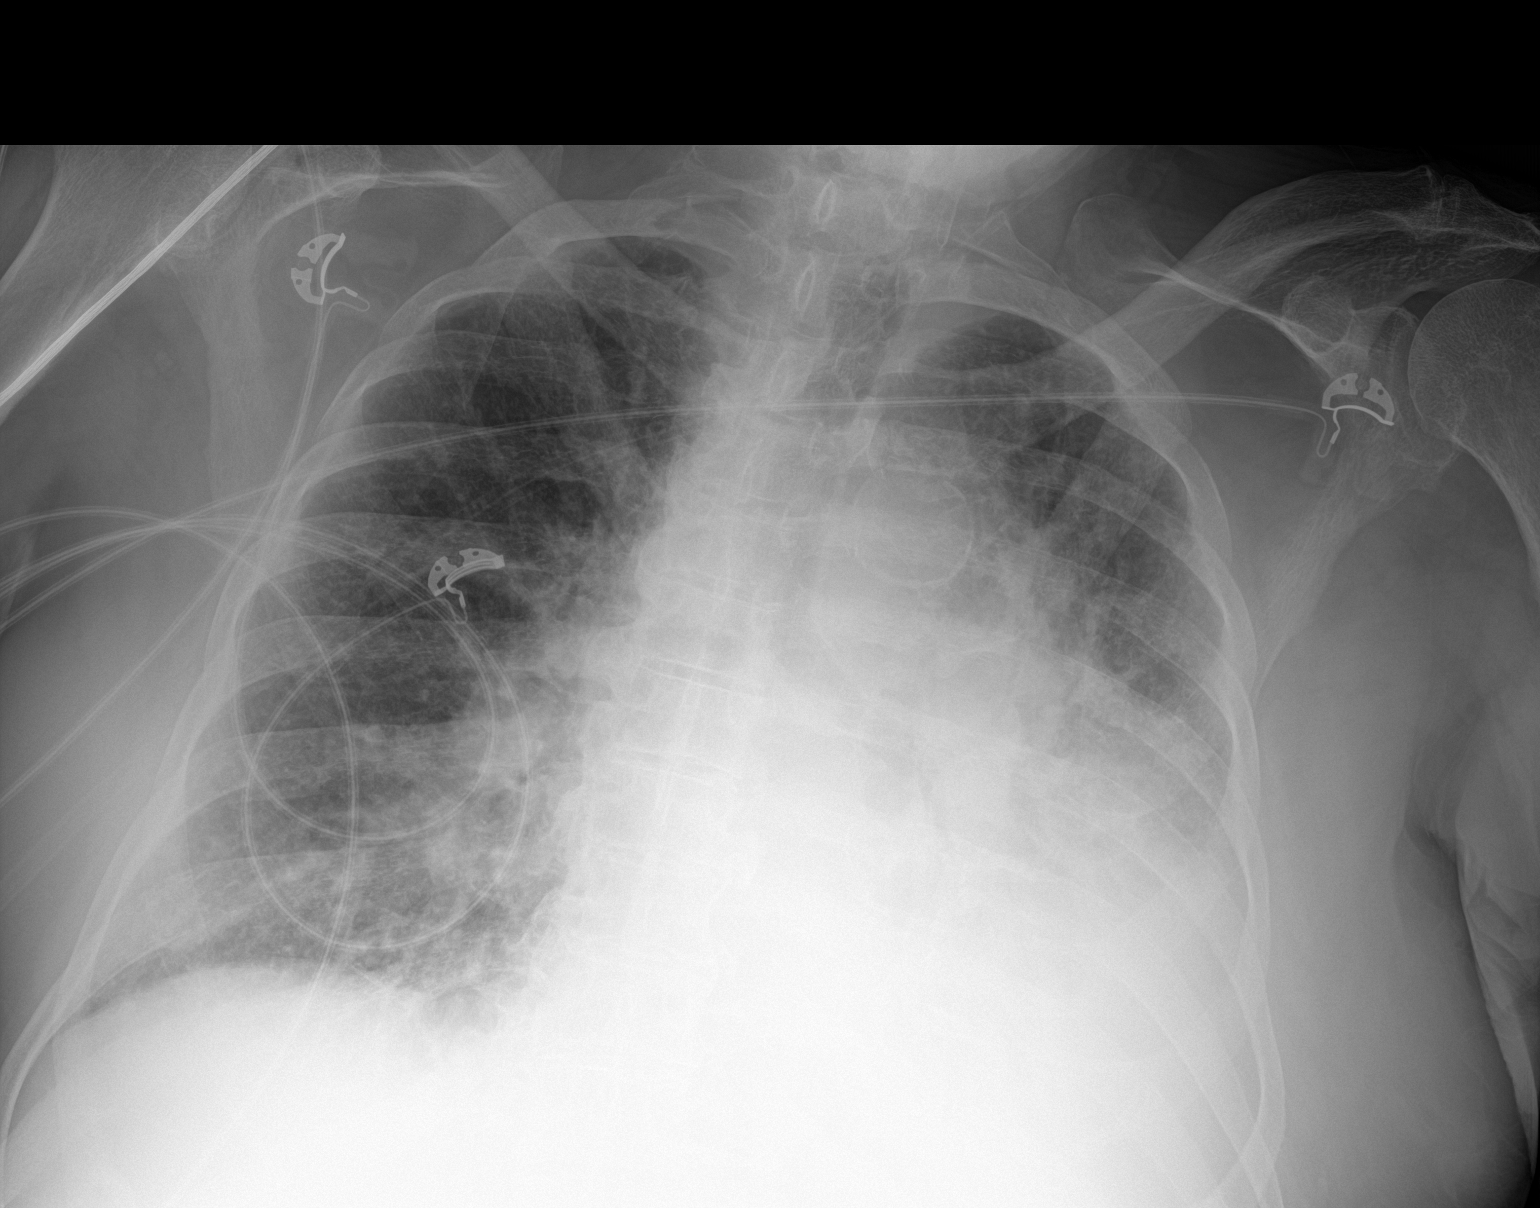

[1 of 1 positions shown; findings below may reference images not displayed]

FINDINGS: Cardiac shadow is enlarged but stable. Aortic calcifications are
again seen. Persistent left basilar airspace disease with associated
effusion is noted. Some interval clearing in the right base is
noted. Persistent central vascular congestion is seen.
IMPRESSION: Persistent vascular congestion.

Stable left basilar airspace opacity with a fusion although clearing
in the right base is noted.

## 2022-05-16 IMAGING — US US RENAL
1 series · 14 of 25 positions shown · non-contrast
Comparison: None.

CLINICAL DATA: Acute renal failure

EXAM:
RENAL / URINARY TRACT ULTRASOUND COMPLETE

[Series 1: us renal · 14 of 28 slices shown]
[im 1/28]
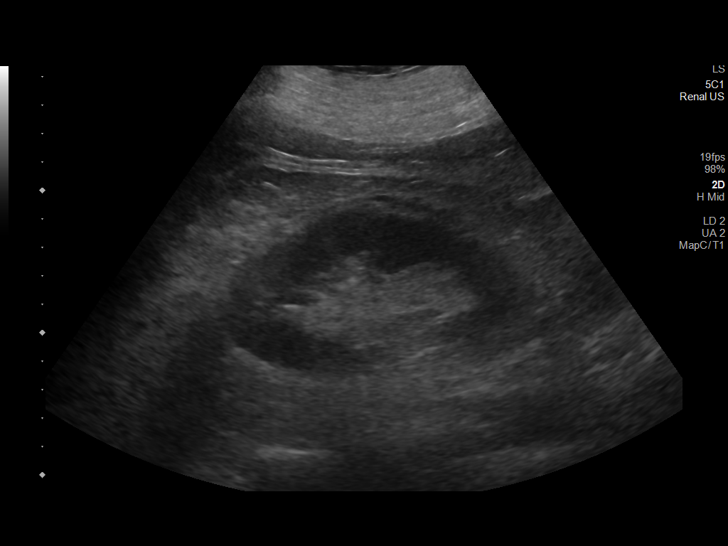
[im 3/28]
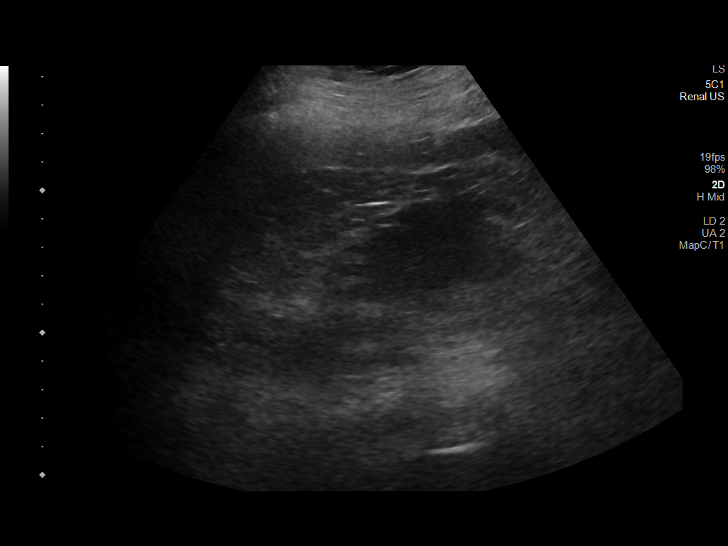
[im 5/28]
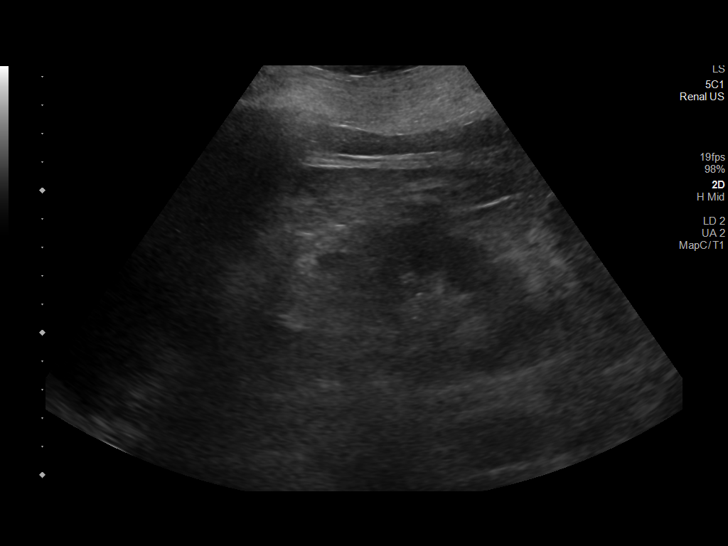
[im 7/28]
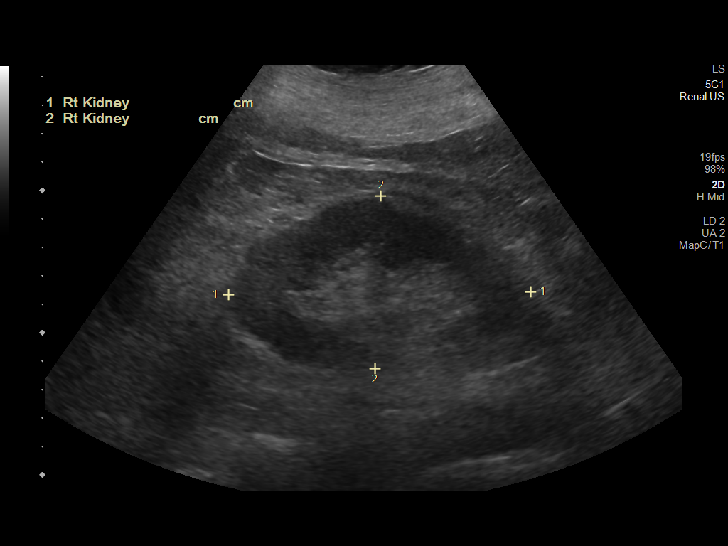
[im 10/28]
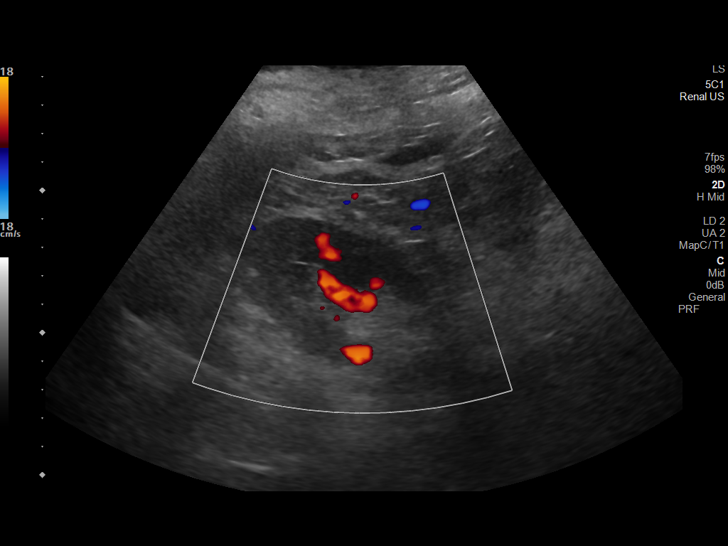
[im 11/28]
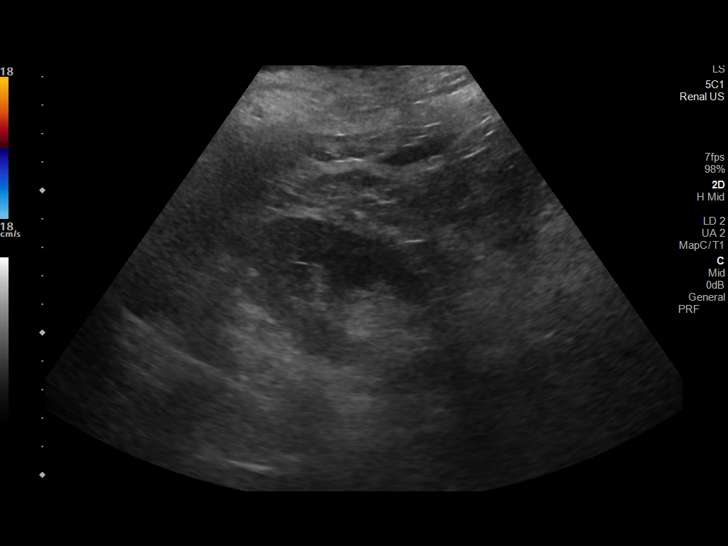
[im 13/28]
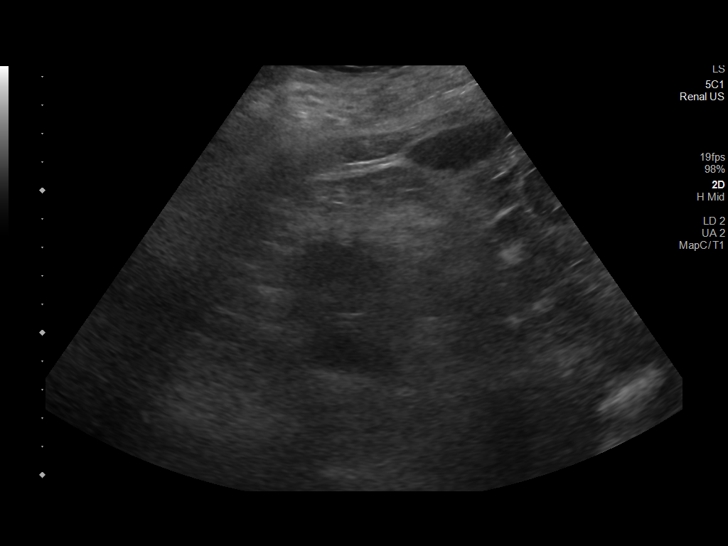
[im 15/28]
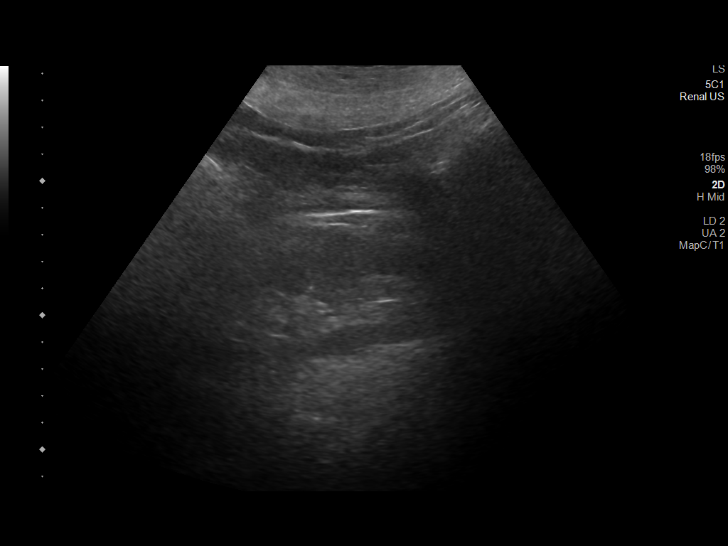
[im 17/28]
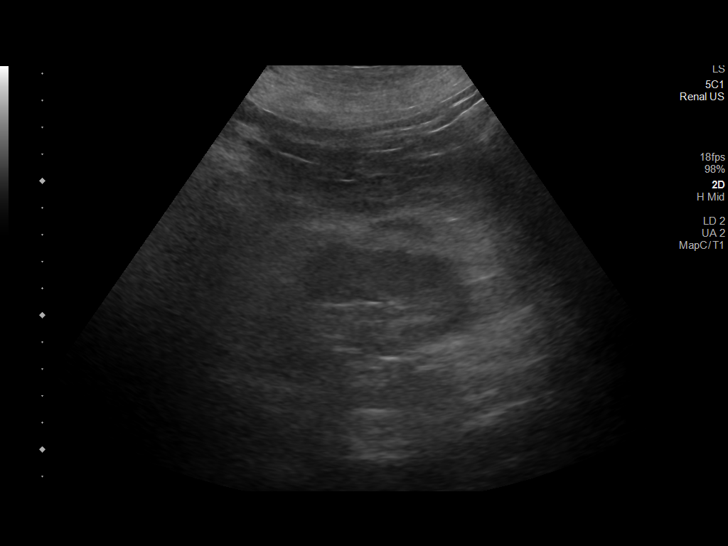
[im 19/28]
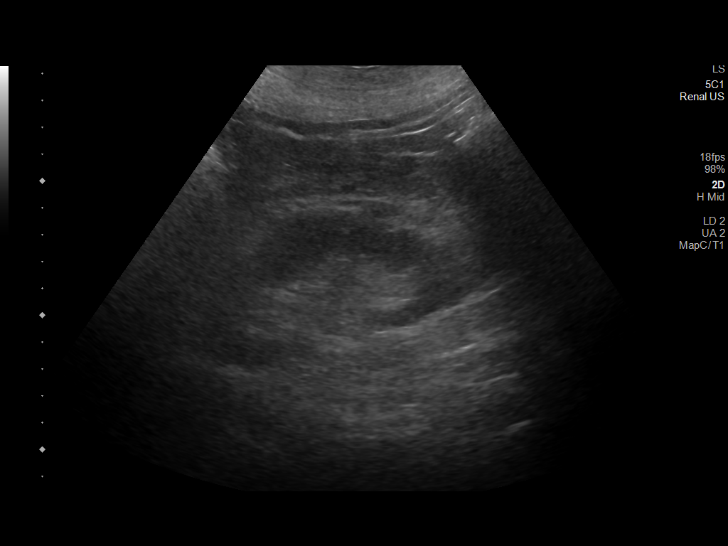
[im 21/28]
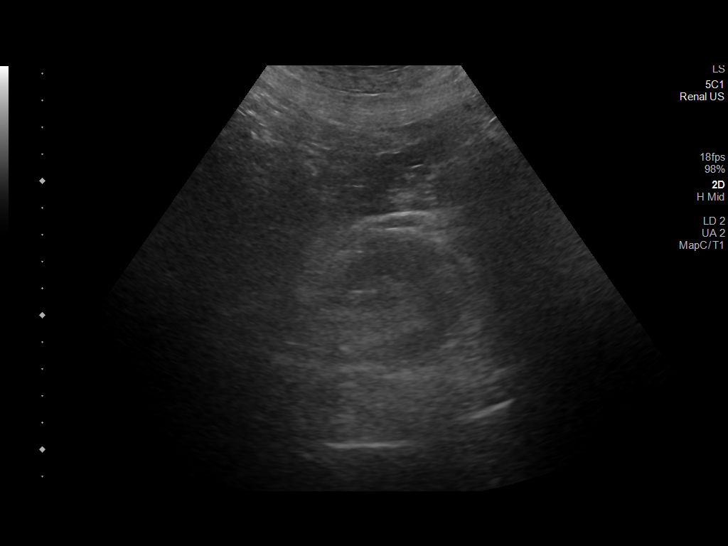
[im 23/28]
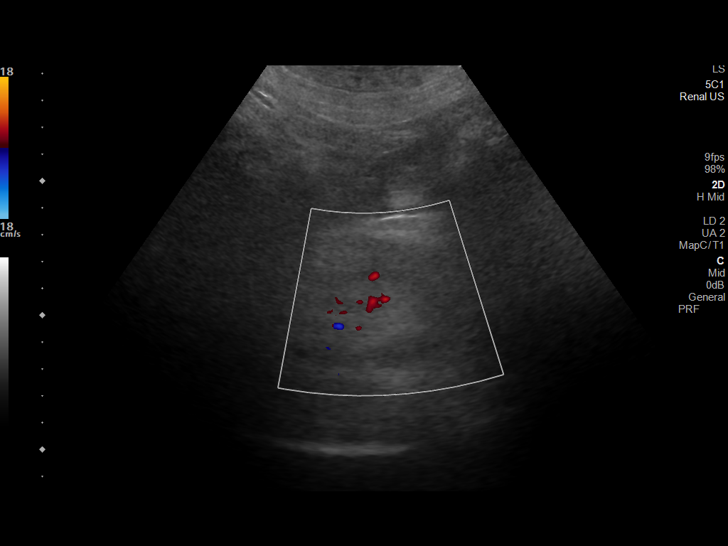
[im 25/28]
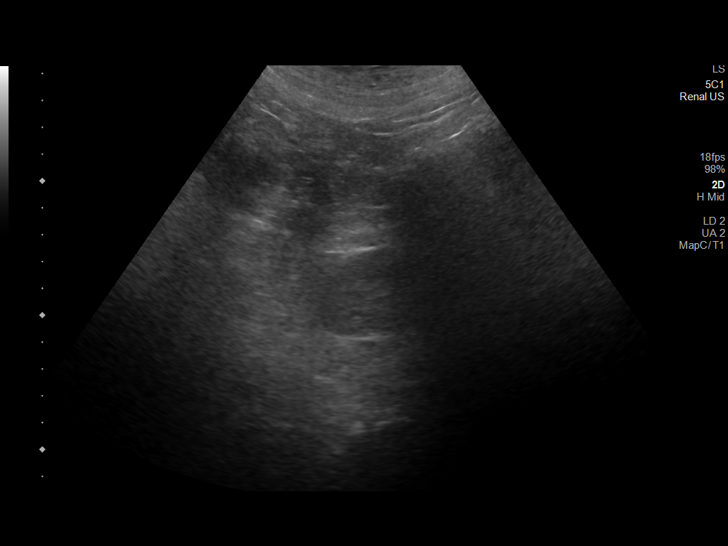
[im 28/28]
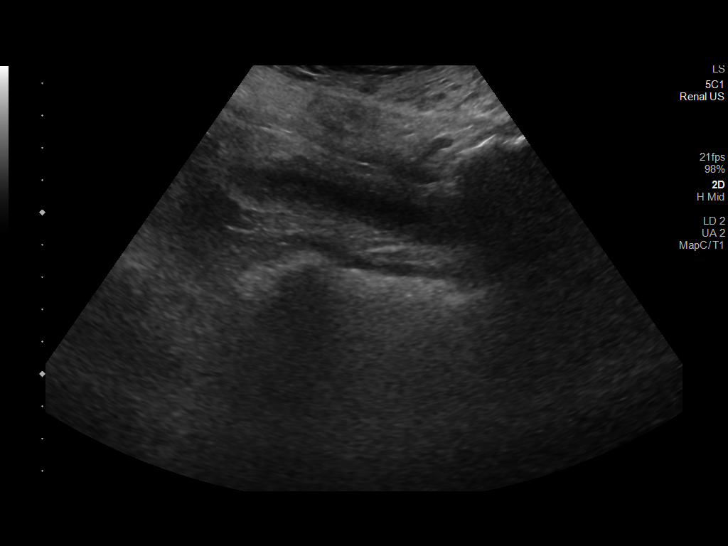

[14 of 25 positions shown; findings below may reference images not displayed]

FINDINGS: Right Kidney:

Renal measurements: 10.6 x 6.1 x 5.6 cm = volume: 188 mL.
Echogenicity within normal limits. No mass or hydronephrosis
visualized.

Left Kidney:

Renal measurements: 10.9 x 5.3 x 4.8 cm = volume: 143 mL.
Echogenicity within normal limits. No mass or hydronephrosis
visualized.

Bladder:

Bladder decompressed and poorly assessed by sonography.

Other:

None.
IMPRESSION: 1. Unremarkable sonographic appearance of the kidneys.
2. Decompressed urinary bladder poorly assessed by sonography.

## 2022-06-10 IMAGING — CR DG CHEST 2V
2 series · 2 of 2 positions shown · non-contrast
Comparison: 12/11/2020

CLINICAL DATA: Dyspnea, lower extremity edema

EXAM:
CHEST - 2 VIEW

[chest lat]
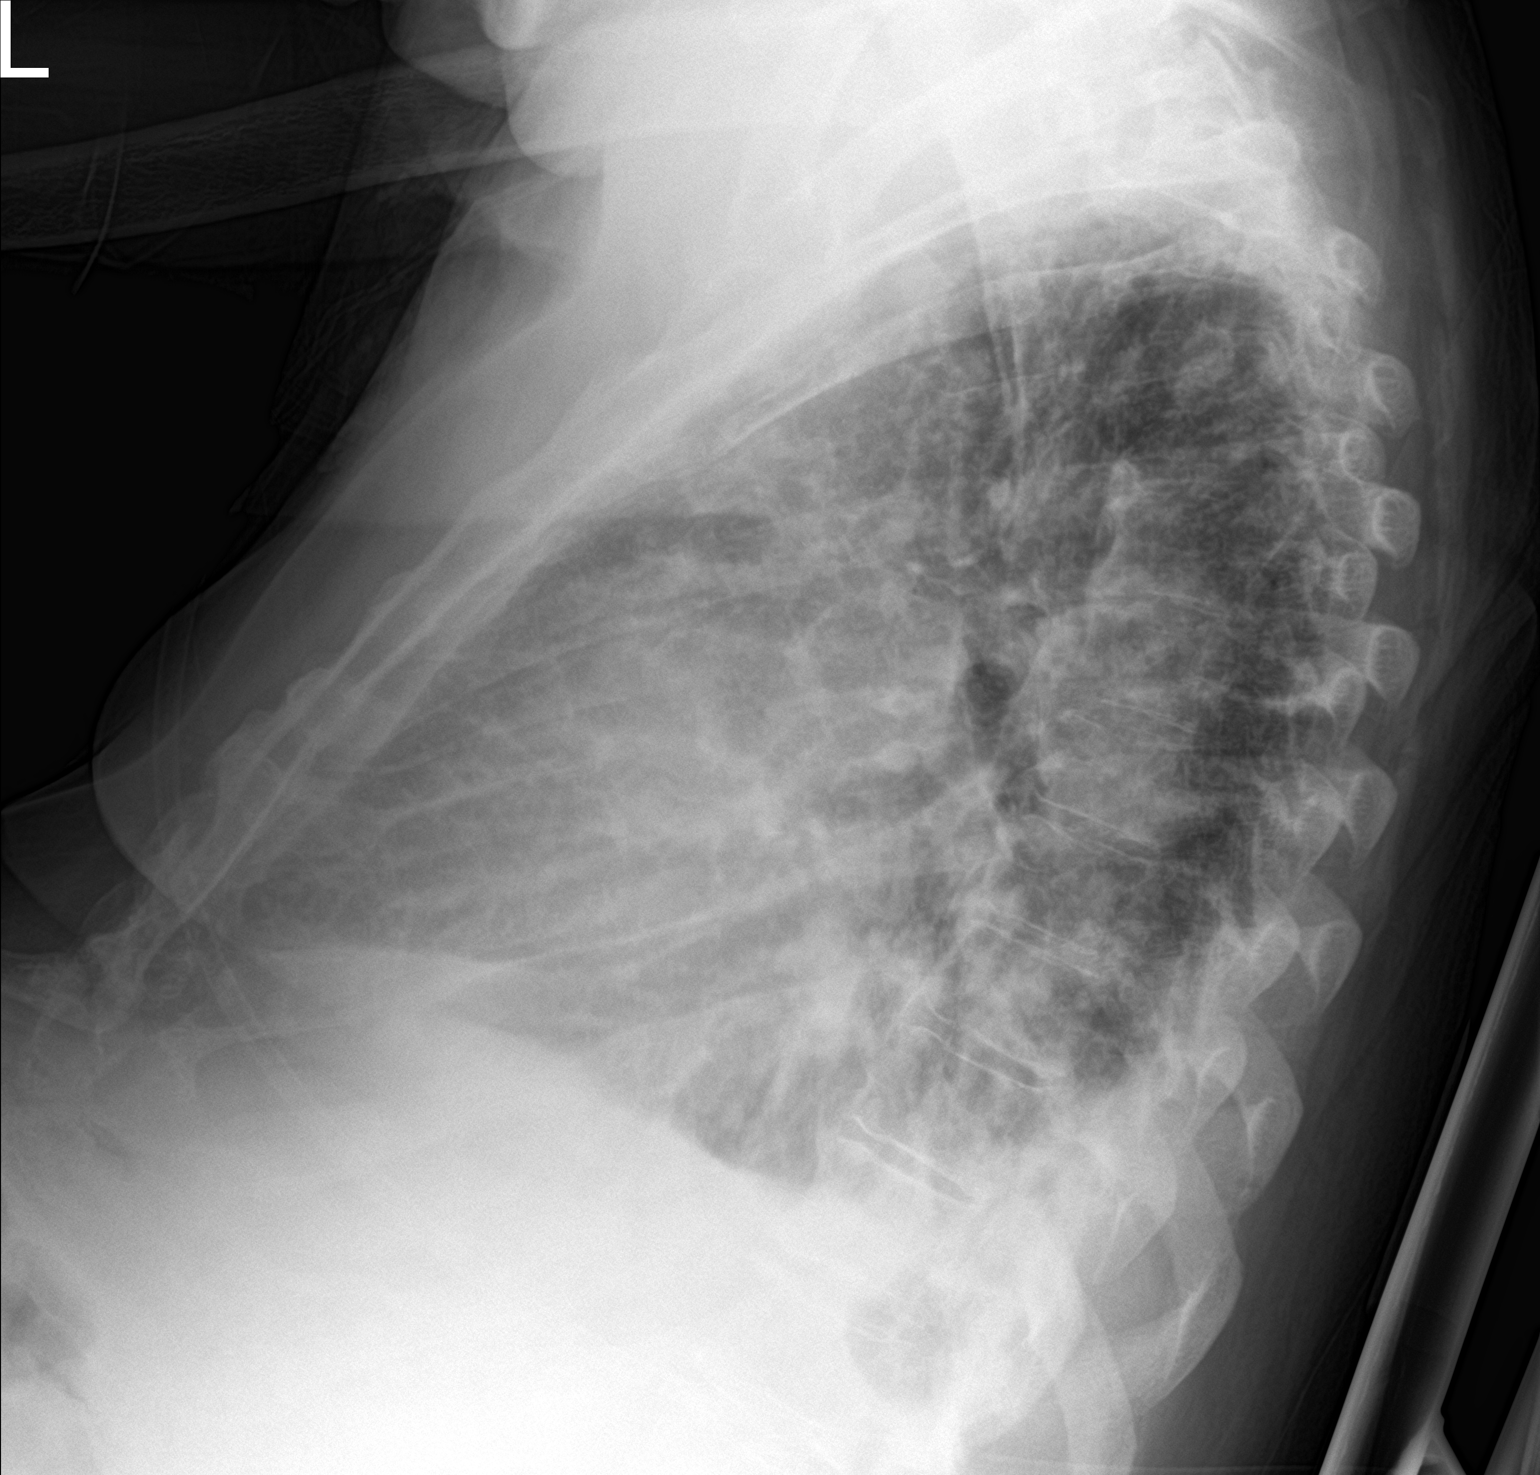

[chest ap]
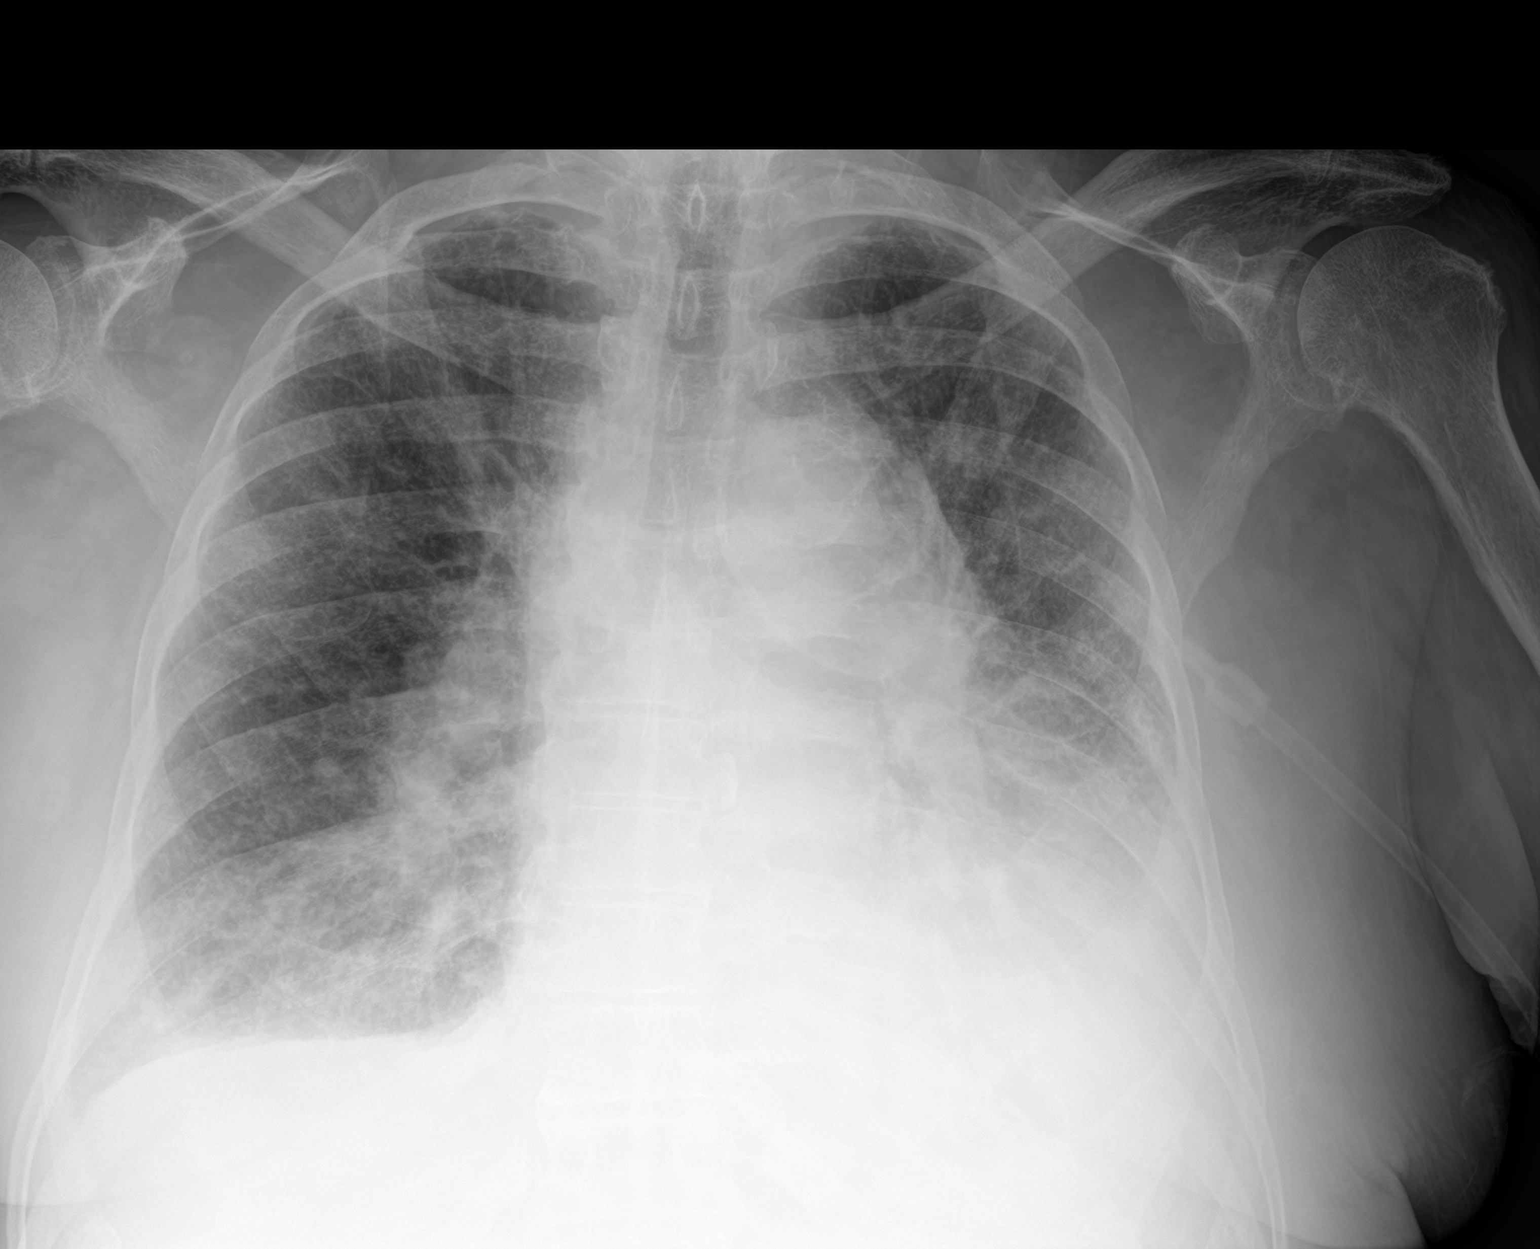

[2 of 2 positions shown; findings below may reference images not displayed]

FINDINGS: Pulmonary insufflation is stable the lungs are symmetrically
expanded. Moderate left pleural effusion is present. Tiny right
pleural effusion is suspected. Diffuse interstitial pulmonary
infiltrate is present demonstrating a basilar predominance most in
keeping with mild pulmonary edema, likely cardiogenic in nature.
Mild to moderate cardiomegaly is unchanged. Central pulmonary
arterial enlargement is again noted. No pneumothorax. No acute bone
abnormality.
IMPRESSION: Mild cardiogenic failure.  Small bilateral pleural effusions.

## 2022-07-01 IMAGING — DX DG CHEST 1V PORT
1 series · 1 of 1 positions shown · non-contrast
Comparison: Most recent radiograph 01/05/2021. Chest CT 08/09/2019
reviewed

CLINICAL DATA: Shortness of breath.

EXAM:
PORTABLE CHEST 1 VIEW

[chest ap]
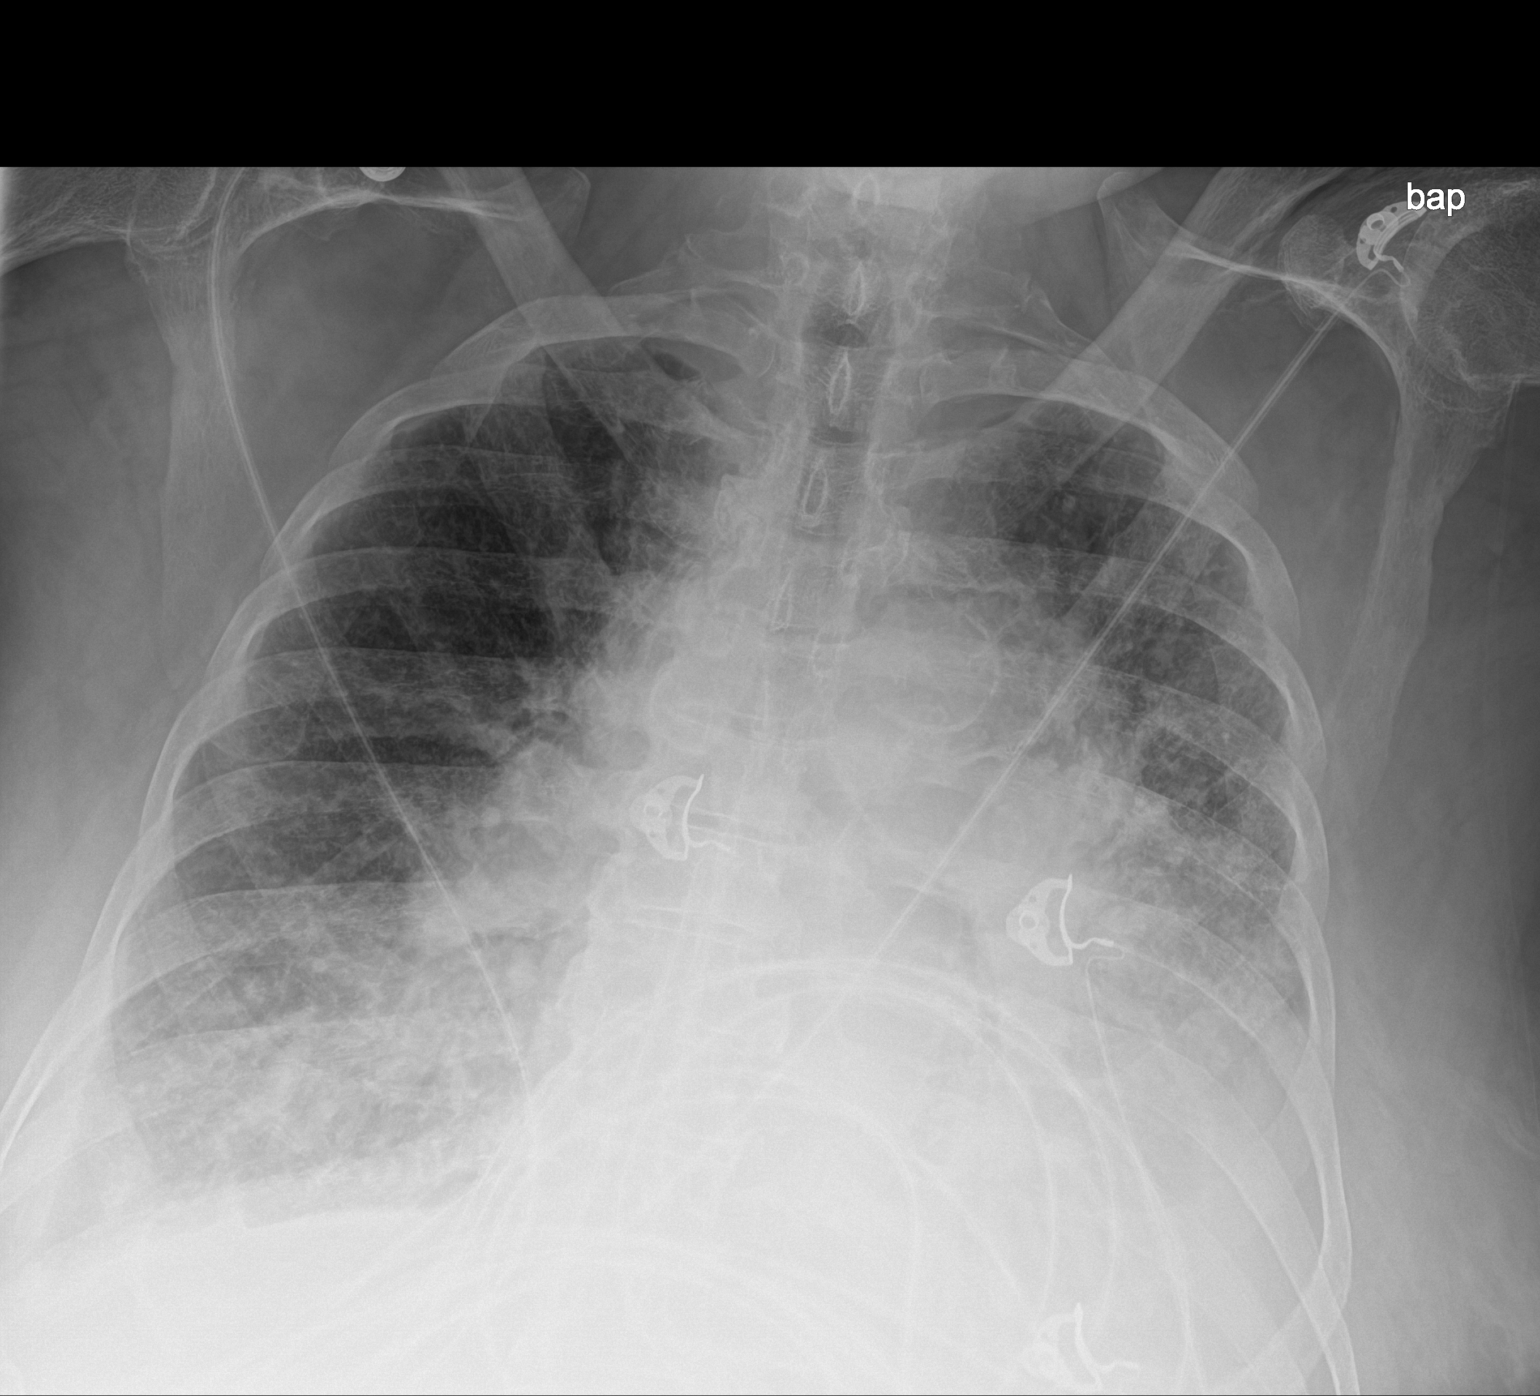

[1 of 1 positions shown; findings below may reference images not displayed]

FINDINGS: Chronic cardiomegaly. Progressive pulmonary edema. Retrocardiac
opacity likely combination of pleural effusion and
atelectasis/airspace disease. Increased right pleural effusion from
prior exam. No pneumothorax.
IMPRESSION: 1. Progressive pulmonary edema and right pleural effusion from prior
exam.
2. Retrocardiac opacity likely combination of pleural fluid and
atelectasis/airspace disease.
3. Chronic cardiomegaly.

## 2022-07-02 IMAGING — US US RENAL
1 series · 14 of 25 positions shown · non-contrast
Comparison: 12/11/2020

CLINICAL DATA: 74-year-old male with renal failure.

EXAM:
RENAL / URINARY TRACT ULTRASOUND COMPLETE

[Series 1: us renal · 14 of 47 slices shown]
[im 1/47]
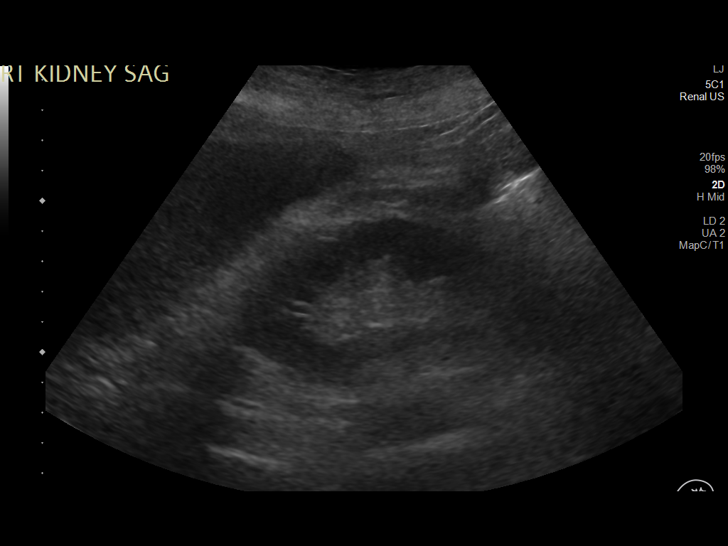
[im 4/47]
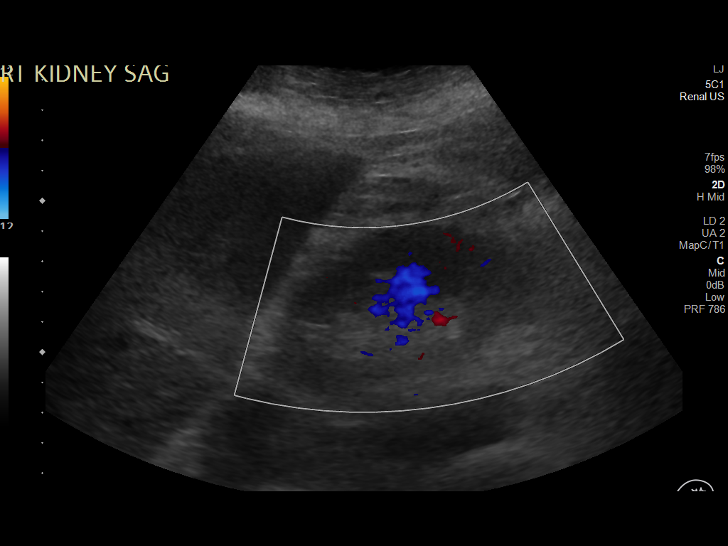
[im 8/47]
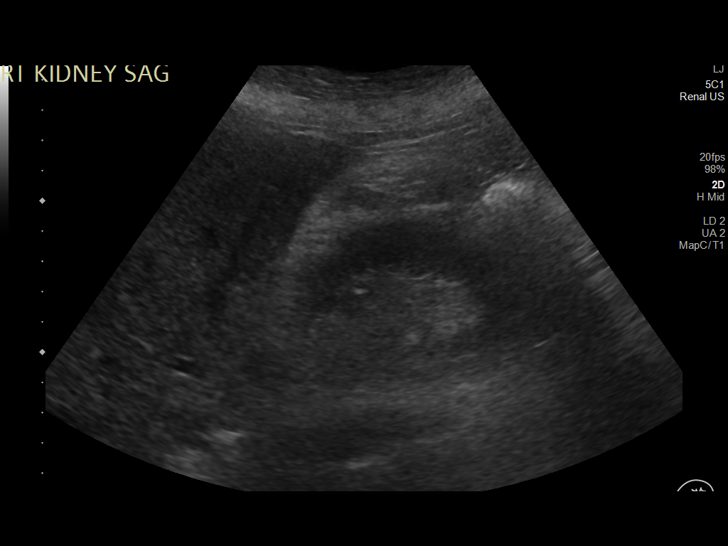
[im 12/47]
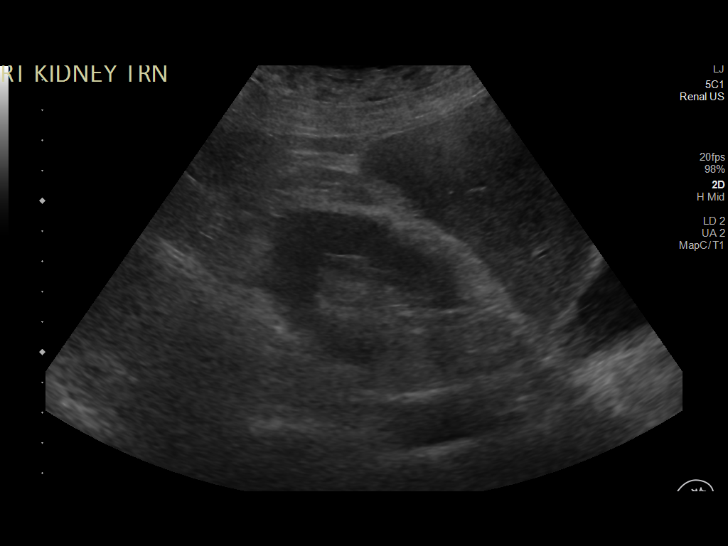
[im 16/47]
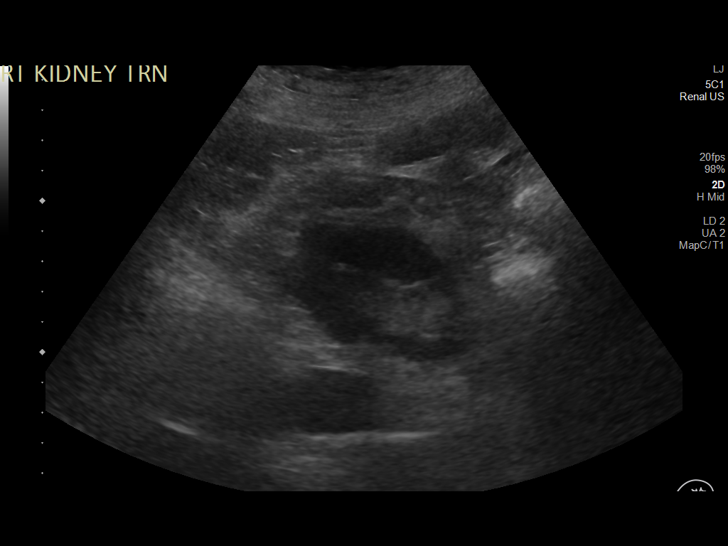
[im 18/47]
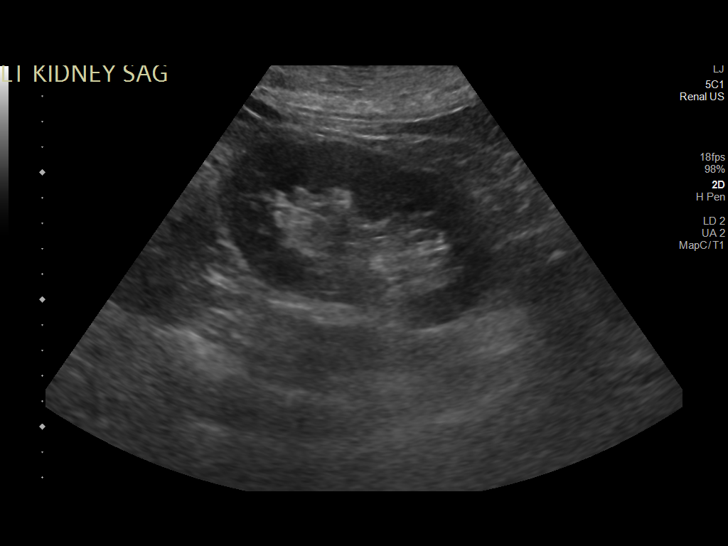
[im 22/47]
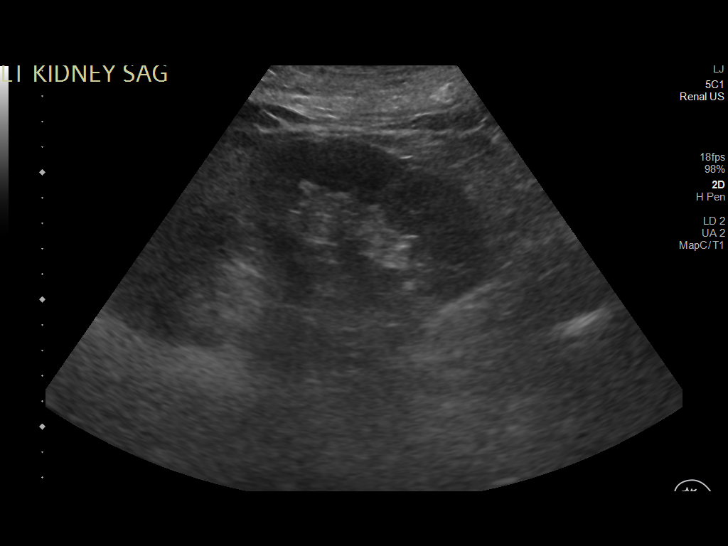
[im 25/47]
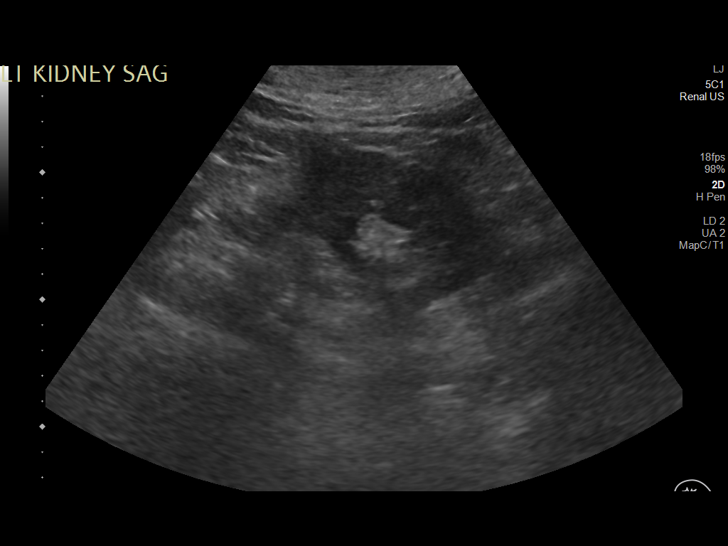
[im 29/47]
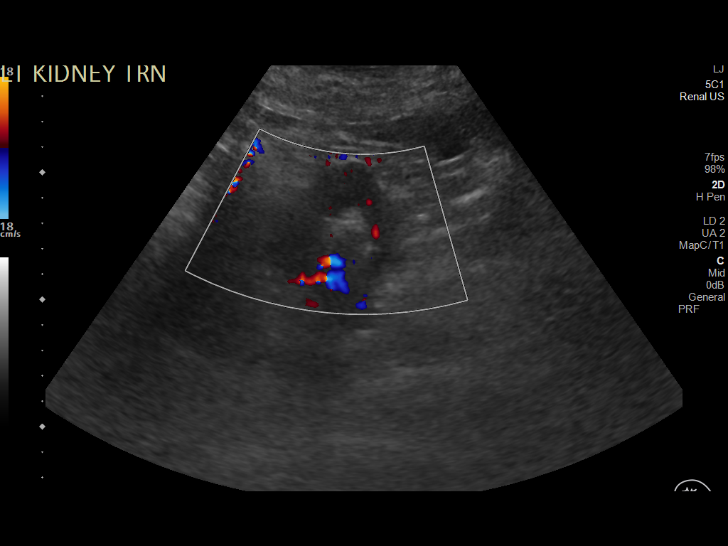
[im 31/47]
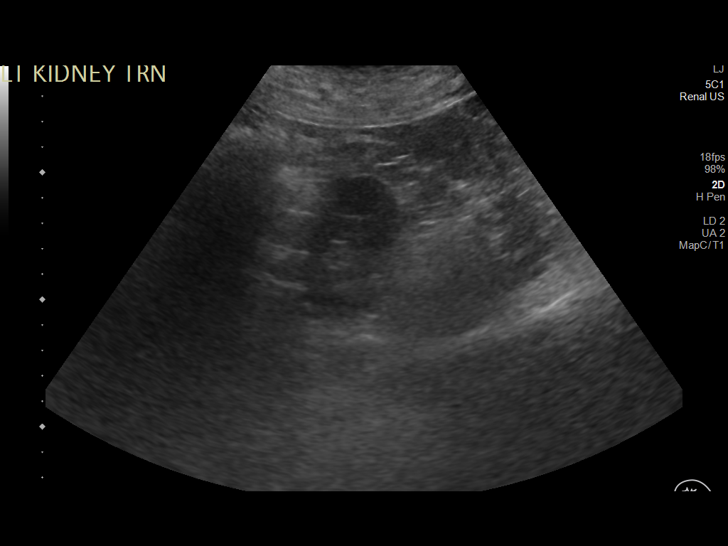
[im 35/47]
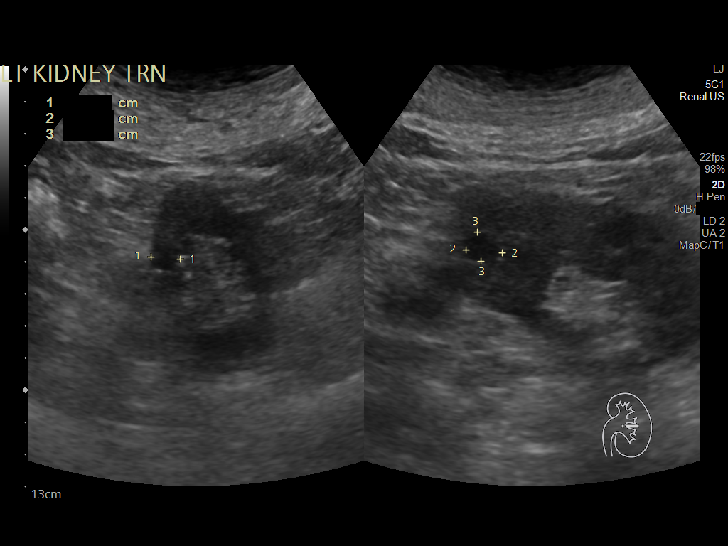
[im 39/47]
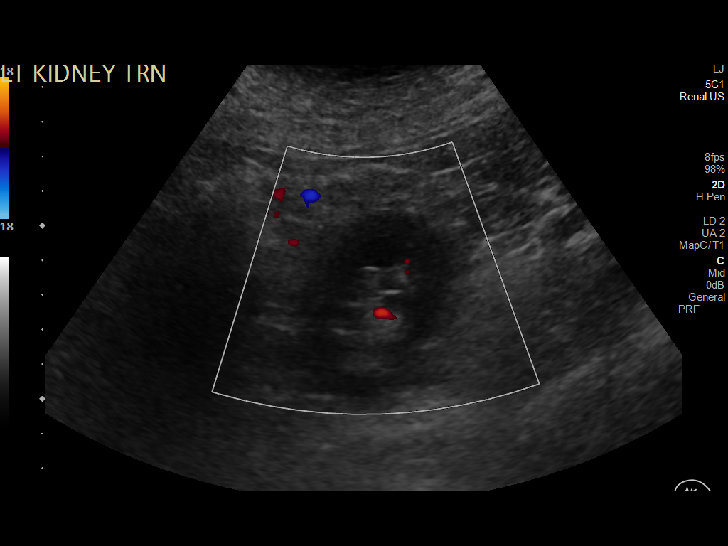
[im 43/47]
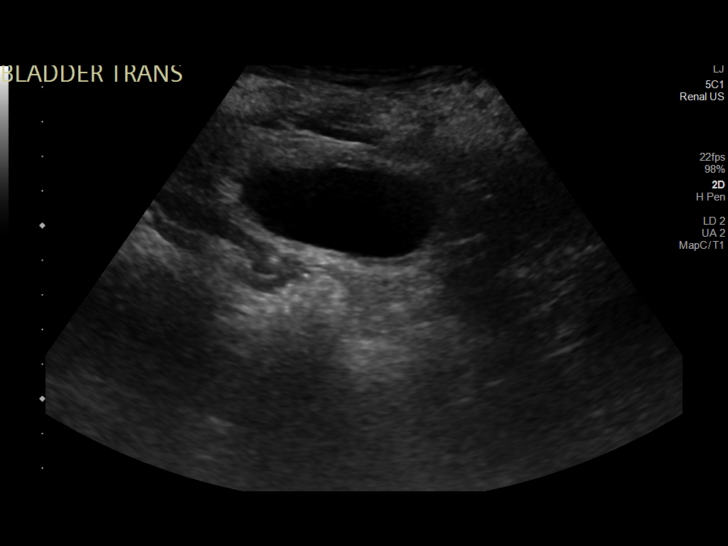
[im 47/47]
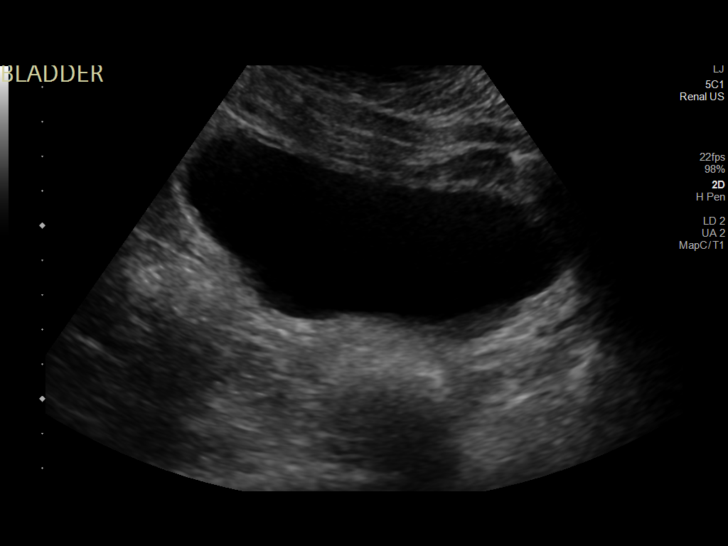

[14 of 25 positions shown; findings below may reference images not displayed]

FINDINGS: Right Kidney:

Renal measurements: 10.3 x 4.7 x 5.9 cm = volume: 147 mL.
Echogenicity within normal limits. No mass or hydronephrosis
visualized.

Left Kidney:

Renal measurements: 11.1 x 6.3 x 4.8 cm = volume: 175 mL.
Echogenicity within normal limits. No solid mass or hydronephrosis
visualized. Two cysts within the LEFT kidney are noted, measuring
1.1 and 2 cm.

Bladder:

Appears normal for degree of bladder distention.

Other:

None.
IMPRESSION: Unremarkable kidneys except for 2 LEFT renal cysts.

## 2022-08-13 IMAGING — CT CT CERVICAL SPINE W/O CM
3 series · 12 of 27 positions shown, 15 images · non-contrast
Comparison: CT head dated 12/11/2020.

CLINICAL DATA: Fall with head and neck pain.

EXAM:
CT HEAD WITHOUT CONTRAST
CT CERVICAL SPINE WITHOUT CONTRAST
TECHNIQUE: Multidetector CT imaging of the head and cervical spine was
performed following the standard protocol without intravenous
contrast. Multiplanar CT image reconstructions of the cervical spine
were also generated.

[Series 4: c_spine 2.0 st · axial · 0.33mm/px · z∈[-122,+6]mm · 5 of 98 slices shown, 7 images]
[im 17/98  soft-tissue]
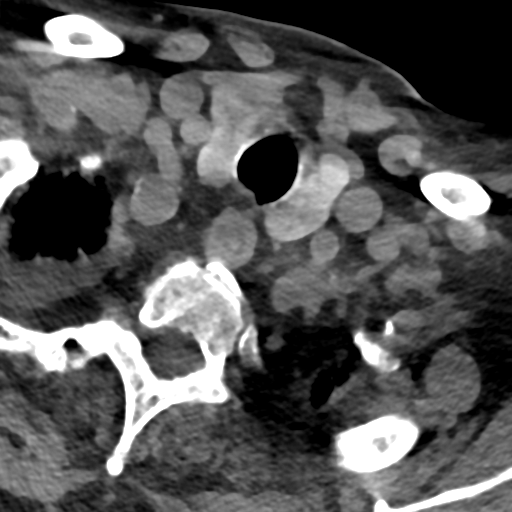
[im 17/98  bone]
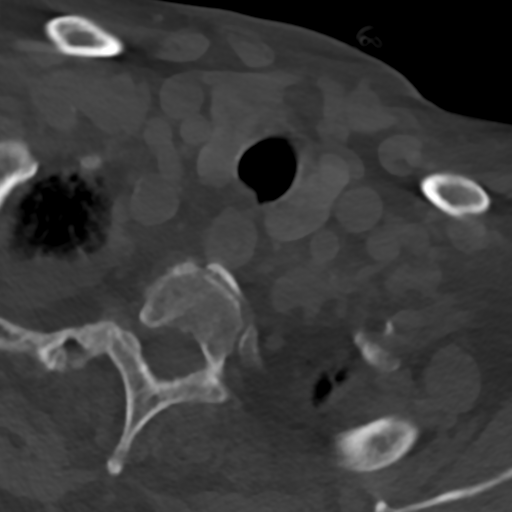
[im 33/98  bone]
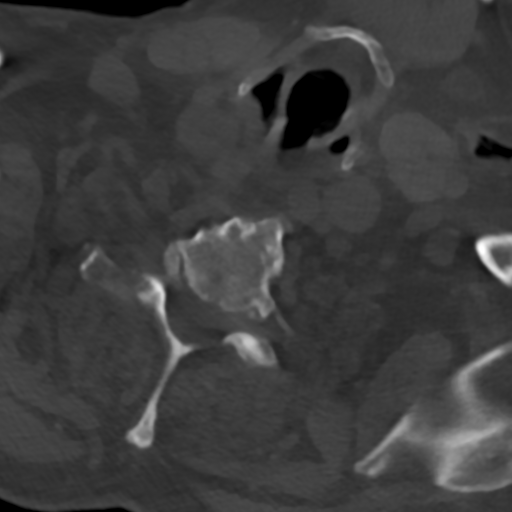
[im 49/98  bone]
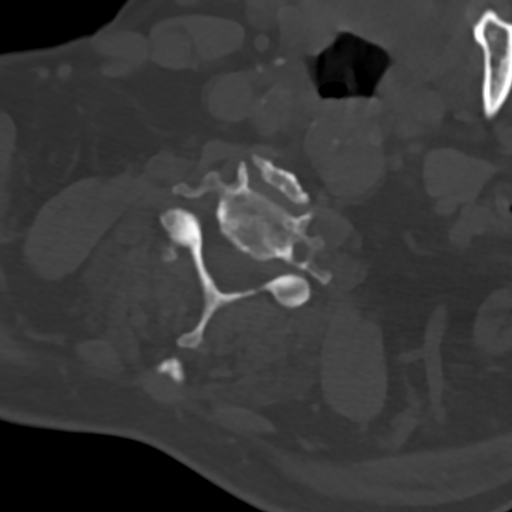
[im 65/98  bone]
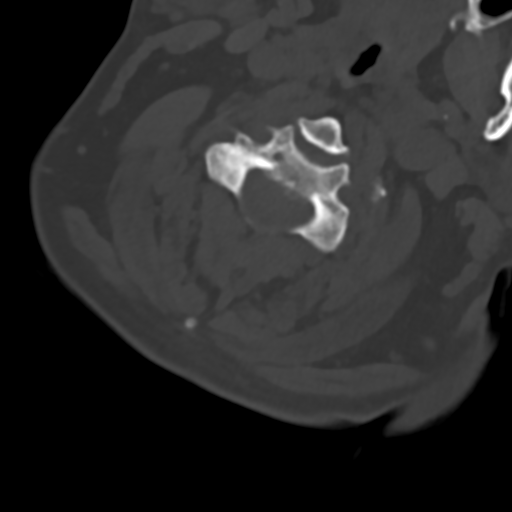
[im 81/98  soft-tissue]
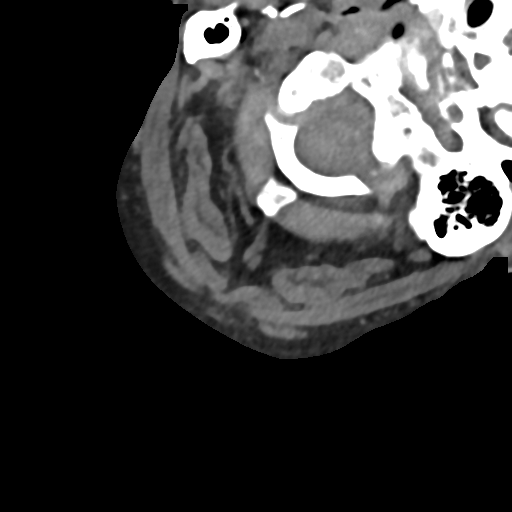
[im 81/98  bone]
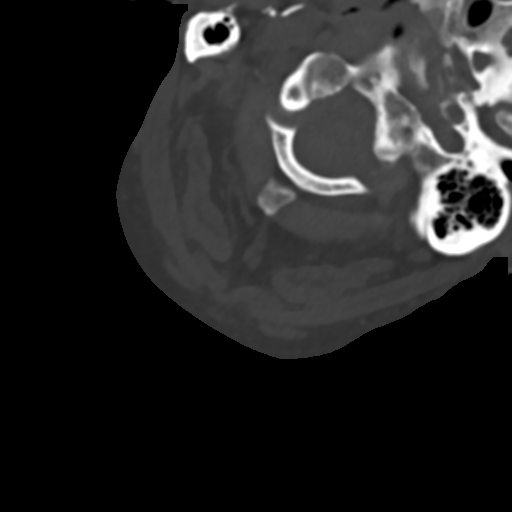

[Series 8: c_spine 2.0 sag bone · sagittal · 0.25mm/px · 5 of 61 slices shown, 6 images]
[im 21/61  bone]
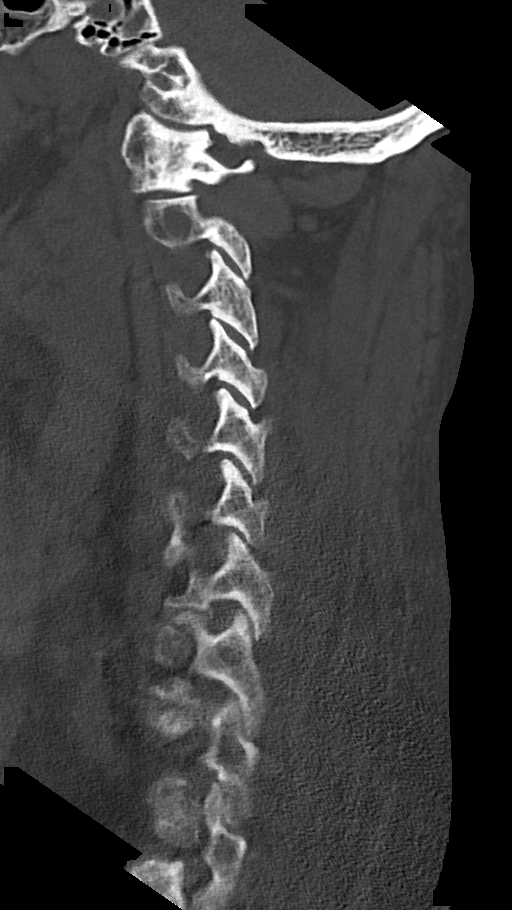
[im 26/61  bone]
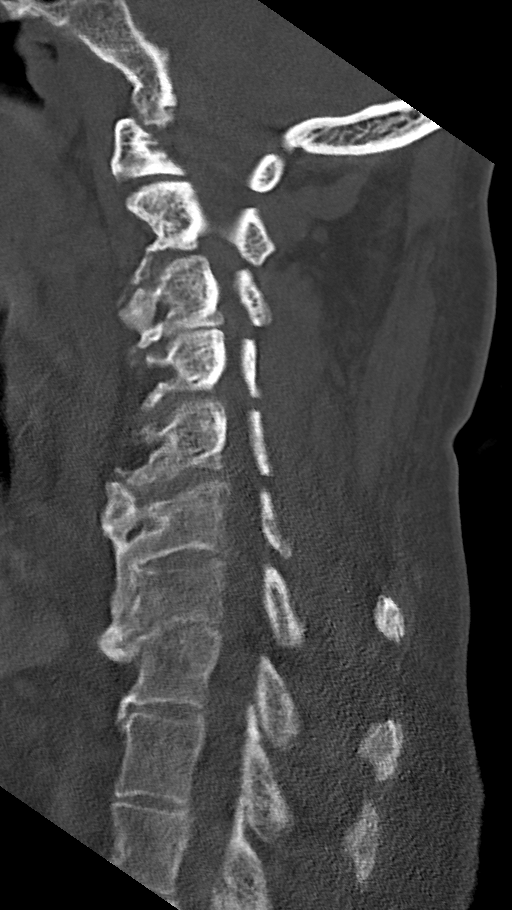
[im 31/61  soft-tissue]
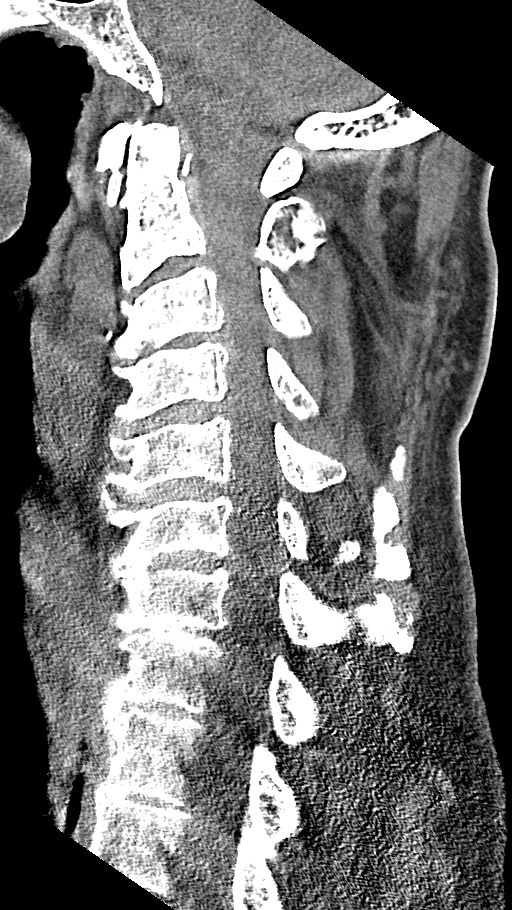
[im 31/61  bone]
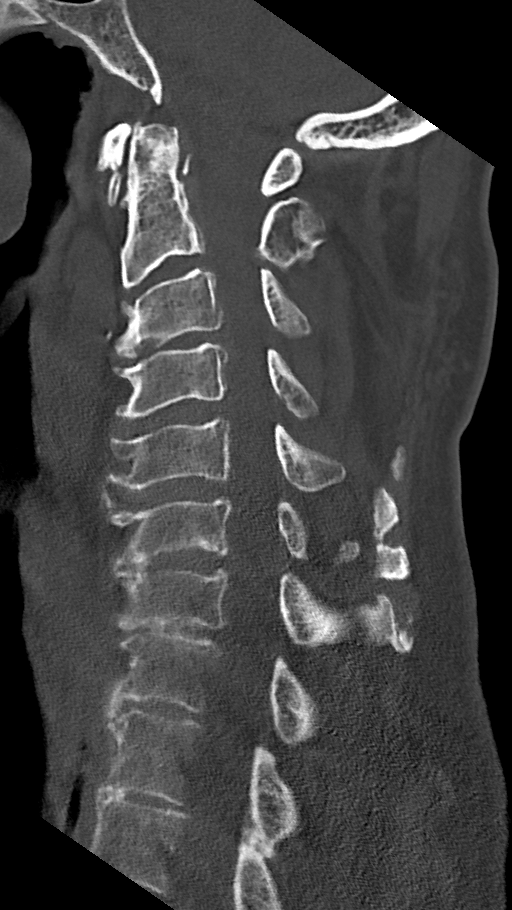
[im 36/61  bone]
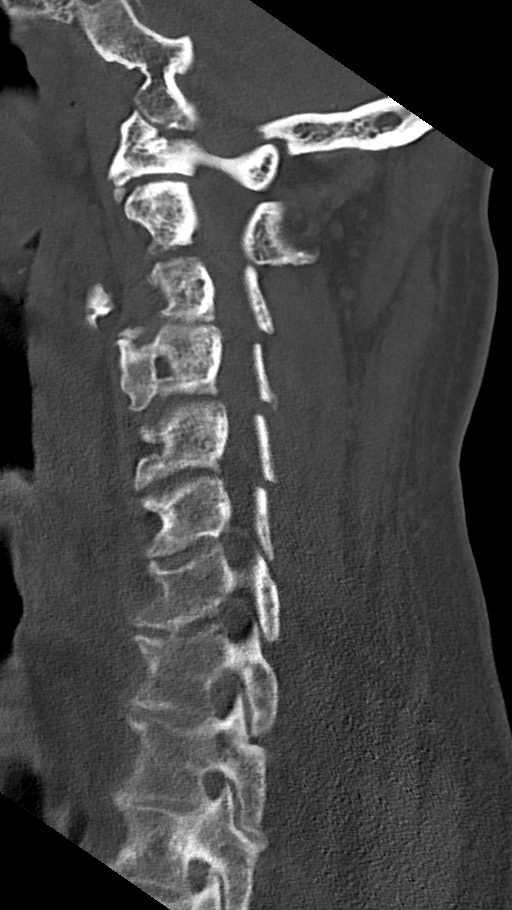
[im 41/61  bone]
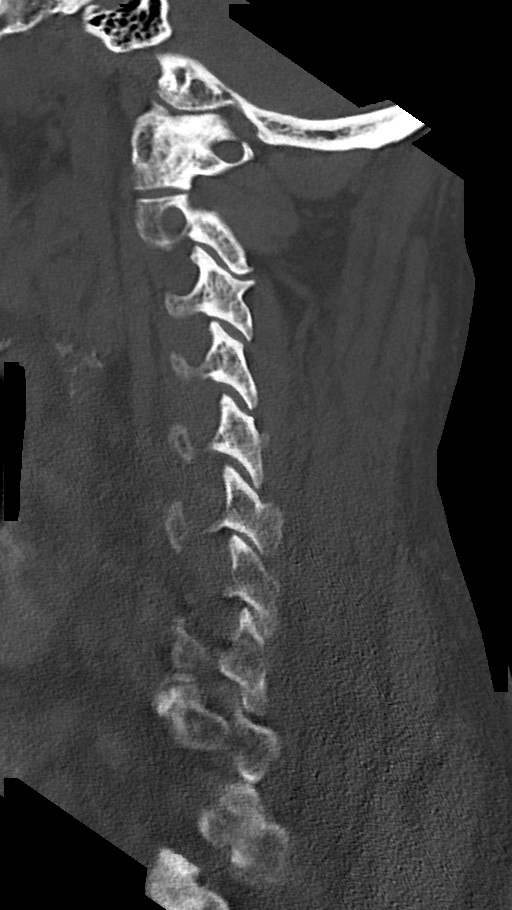

[Series 10: c_spine 2.0 ax bone · axial · 0.23mm/px · z∈[-147,-115]mm · 2 of 113 slices shown]
[im 19/113  bone]
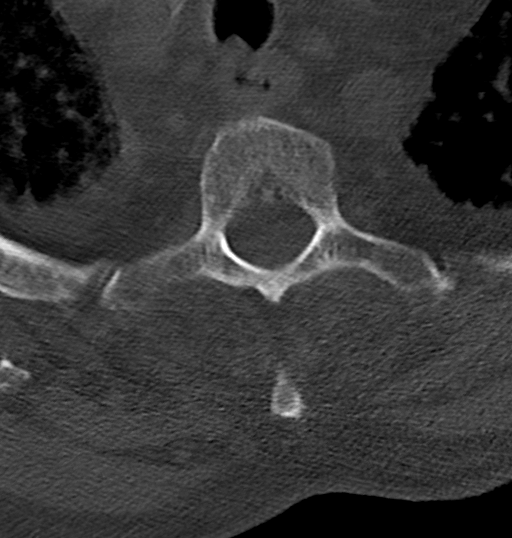
[im 38/113  bone]
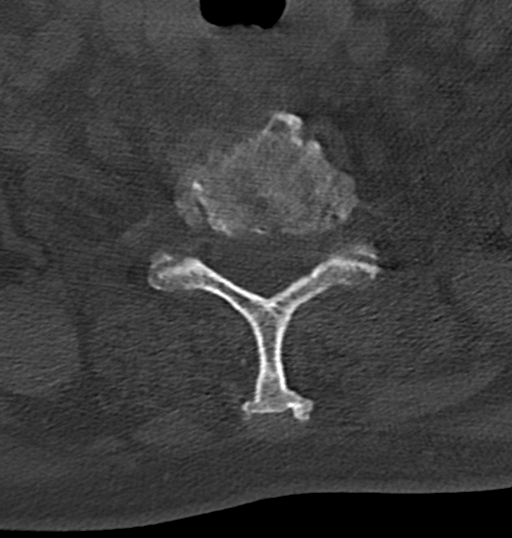

[12 of 27 positions shown; findings below may reference images not displayed]

FINDINGS: CT HEAD FINDINGS

Brain: No evidence of acute infarction, hemorrhage, hydrocephalus,
extra-axial collection or mass lesion/mass effect. There is mild
cerebral volume loss with associated ex vacuo dilatation.
Periventricular white matter hypoattenuation likely represents
chronic small vessel ischemic disease.

Vascular: There are vascular calcifications in the carotid siphons.

Skull: Normal. Negative for fracture or focal lesion.

Sinuses/Orbits: No acute finding.

Other: None.

CT CERVICAL SPINE FINDINGS

Alignment: Gentle cervical kyphosis is likely positional.

Skull base and vertebrae: No acute fracture. No primary bone lesion
or focal pathologic process.

Soft tissues and spinal canal: No prevertebral fluid or swelling. No
visible canal hematoma.

Disc levels: Up to moderate multilevel degenerative disc and joint
disease.

Upper chest: Bilateral pleural effusions with associated atelectasis
are partially imaged.

Other: None.
IMPRESSION: 1. No acute intracranial process.
2. No acute osseous injury in the cervical spine.
3. Bilateral pleural effusions are partially imaged.

## 2022-08-13 IMAGING — CT CT HEAD W/O CM
4 of 5 series · 15 of 47 positions shown, 16 images · non-contrast
Comparison: CT head dated 12/11/2020.

CLINICAL DATA: Fall with head and neck pain.

EXAM:
CT HEAD WITHOUT CONTRAST
CT CERVICAL SPINE WITHOUT CONTRAST
TECHNIQUE: Multidetector CT imaging of the head and cervical spine was
performed following the standard protocol without intravenous
contrast. Multiplanar CT image reconstructions of the cervical spine
were also generated.

[Series 1: head without · axial · non-contrast · 0.43mm/px · z∈[+20,+136]mm · 4 of 39 slices shown, 5 images]
[im 8/39  brain]
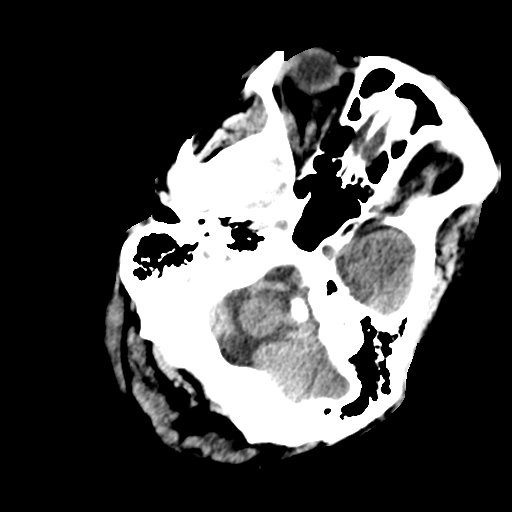
[im 8/39  bone]
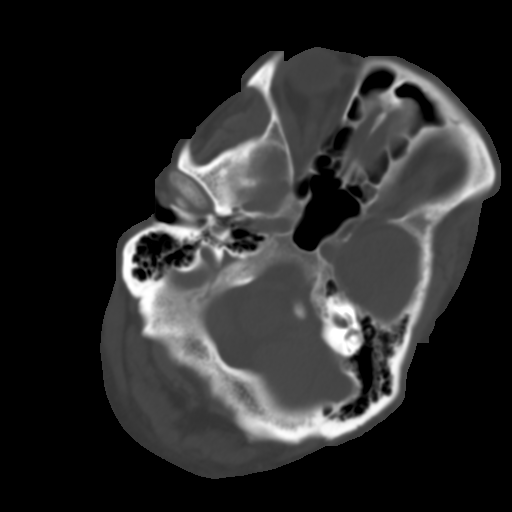
[im 16/39  brain]
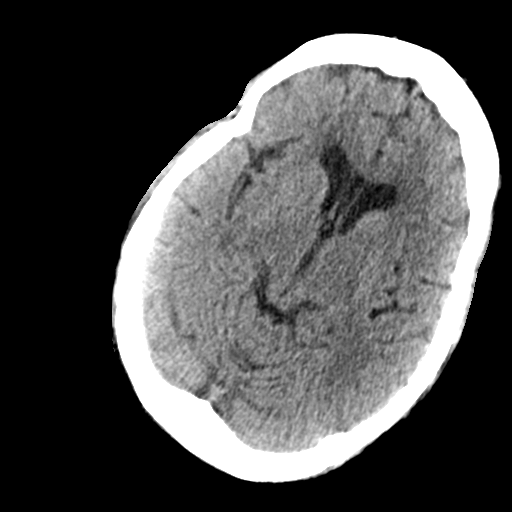
[im 23/39  brain]
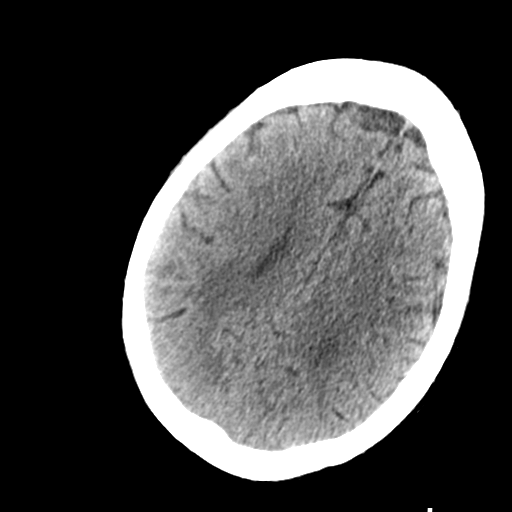
[im 31/39  brain]
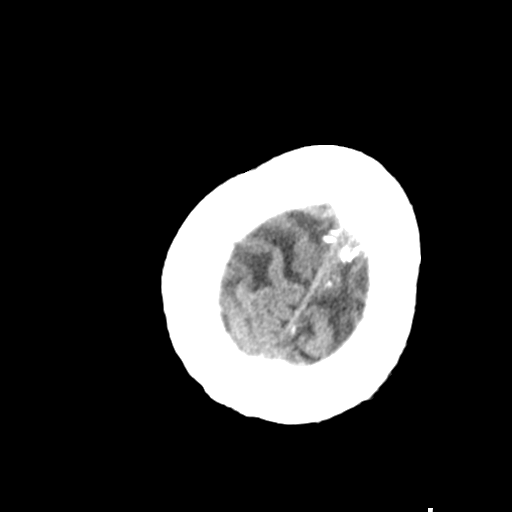

[Series 5: head without cor · coronal · non-contrast · 0.32mm/px · 3 of 74 slices shown]
[im 25/74  brain]
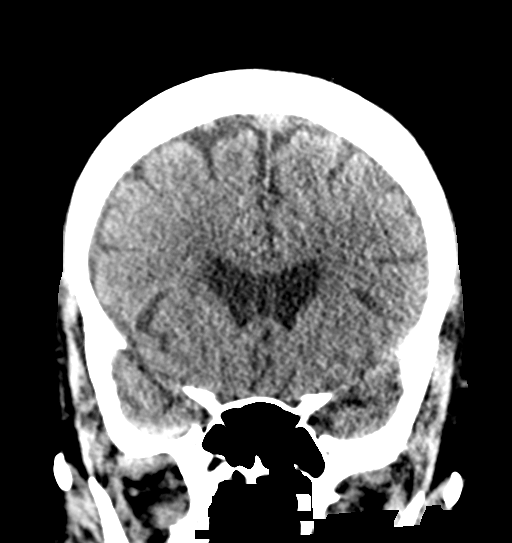
[im 33/74  brain]
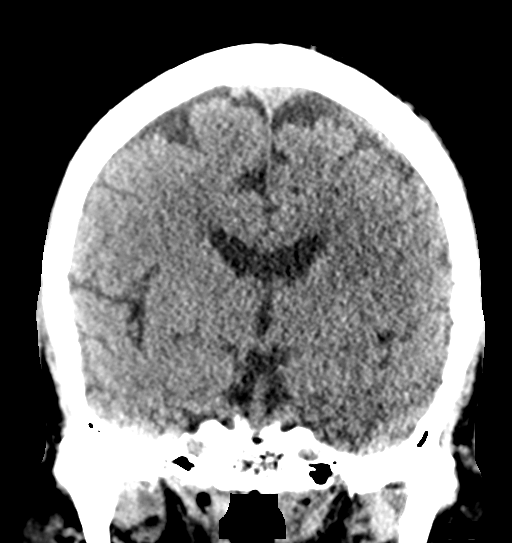
[im 41/74  brain]
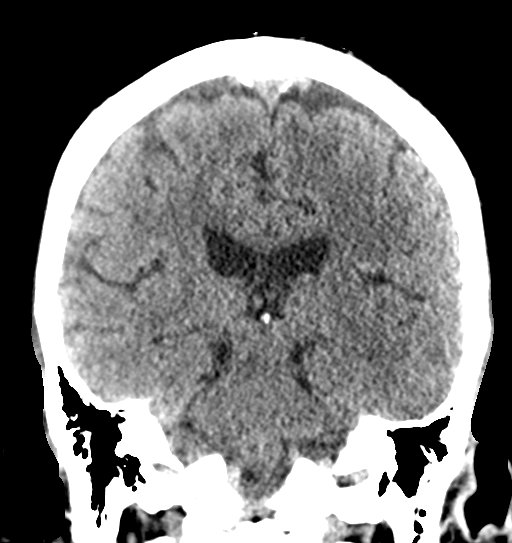

[Series 6: head without sag · sagittal · non-contrast · 0.38mm/px · 3 of 56 slices shown]
[im 20/56  brain]
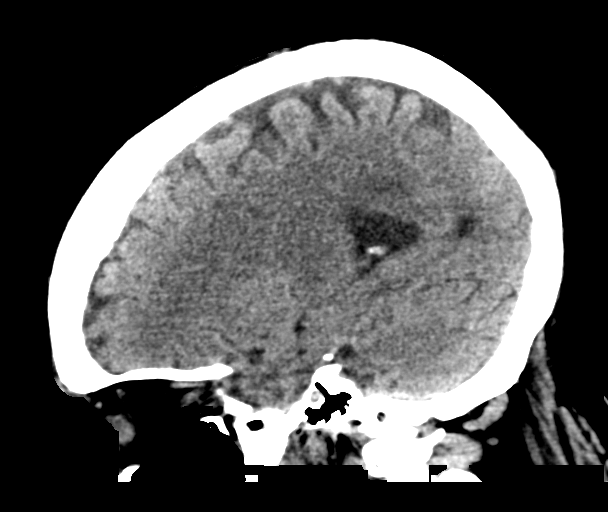
[im 28/56  brain]
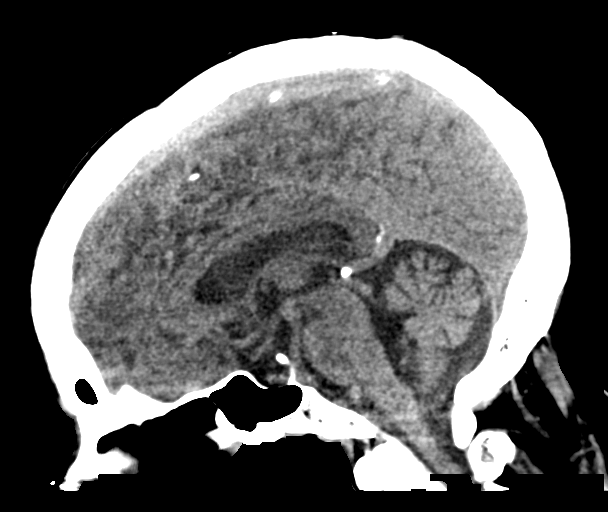
[im 36/56  brain]
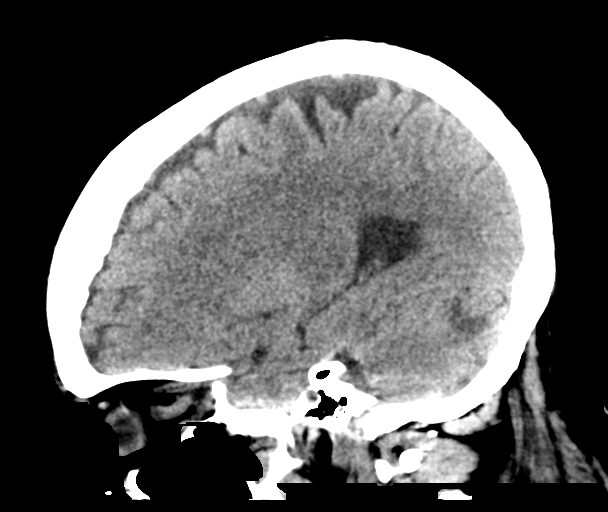

[Series 7: head without ax · axial · non-contrast · 0.32mm/px · z∈[-6,+75]mm · 5 of 58 slices shown]
[im 8/58  brain]
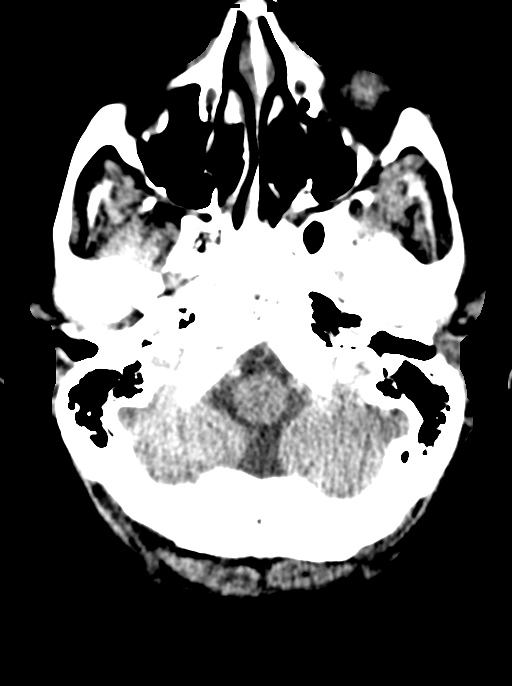
[im 15/58  brain]
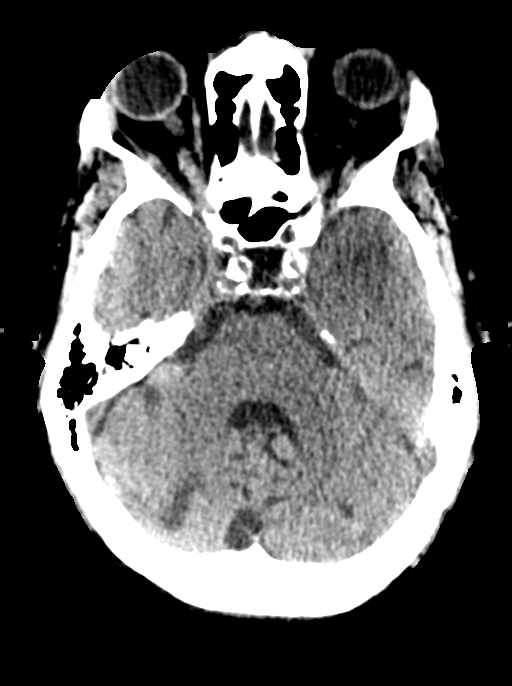
[im 22/58  brain]
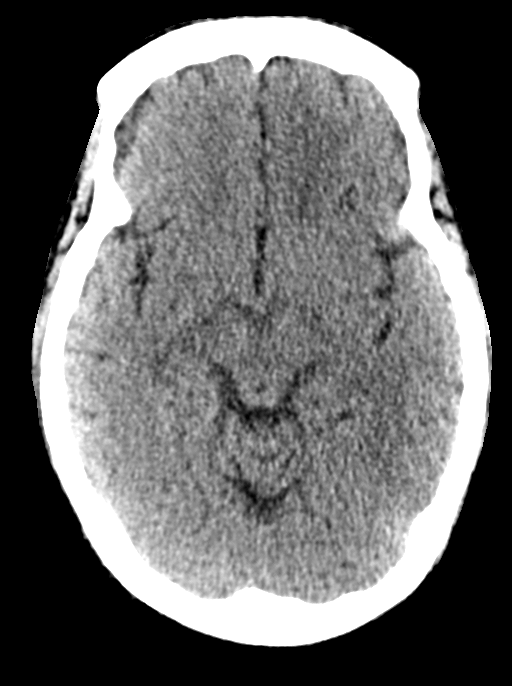
[im 29/58  brain]
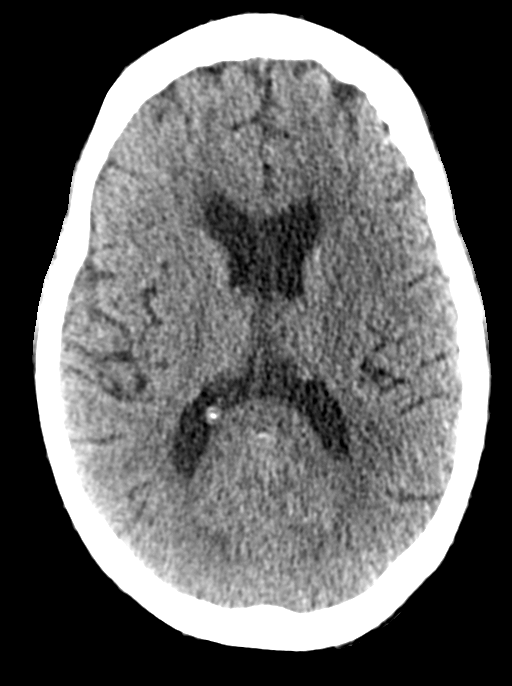
[im 36/58  brain]
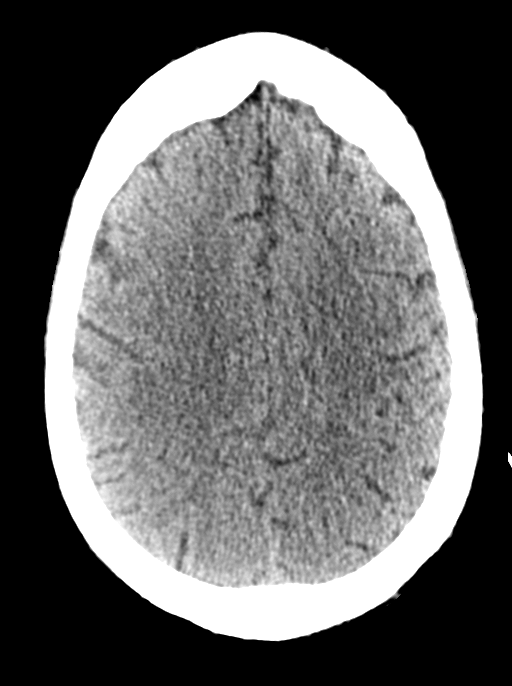

[15 of 47 positions shown; findings below may reference images not displayed]

FINDINGS: CT HEAD FINDINGS

Brain: No evidence of acute infarction, hemorrhage, hydrocephalus,
extra-axial collection or mass lesion/mass effect. There is mild
cerebral volume loss with associated ex vacuo dilatation.
Periventricular white matter hypoattenuation likely represents
chronic small vessel ischemic disease.

Vascular: There are vascular calcifications in the carotid siphons.

Skull: Normal. Negative for fracture or focal lesion.

Sinuses/Orbits: No acute finding.

Other: None.

CT CERVICAL SPINE FINDINGS

Alignment: Gentle cervical kyphosis is likely positional.

Skull base and vertebrae: No acute fracture. No primary bone lesion
or focal pathologic process.

Soft tissues and spinal canal: No prevertebral fluid or swelling. No
visible canal hematoma.

Disc levels: Up to moderate multilevel degenerative disc and joint
disease.

Upper chest: Bilateral pleural effusions with associated atelectasis
are partially imaged.

Other: None.
IMPRESSION: 1. No acute intracranial process.
2. No acute osseous injury in the cervical spine.
3. Bilateral pleural effusions are partially imaged.

## 2022-08-14 IMAGING — US US RENAL
1 series · 14 of 25 positions shown · non-contrast
Comparison: 01/27/2021

CLINICAL DATA: Acute kidney injury

EXAM:
RENAL / URINARY TRACT ULTRASOUND COMPLETE

[Series 1: us renal · 14 of 54 slices shown]
[im 1/54]
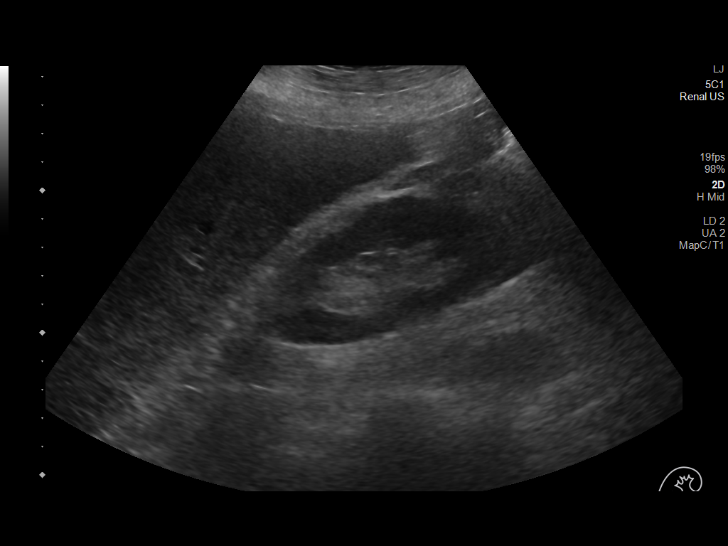
[im 5/54]
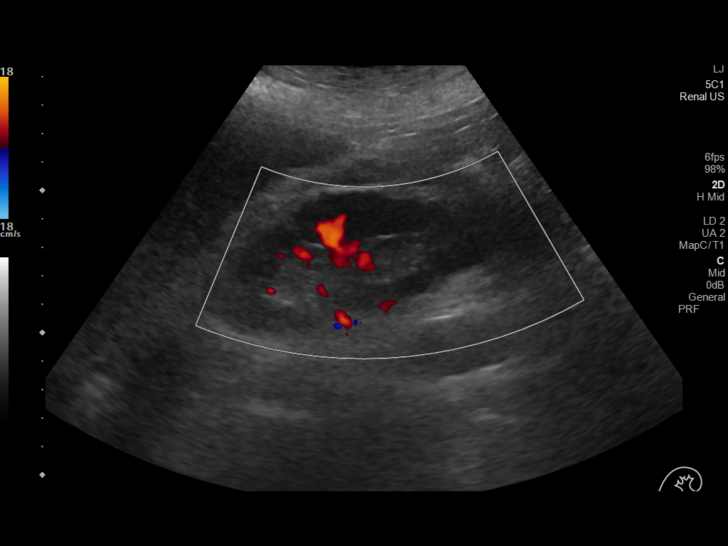
[im 9/54]
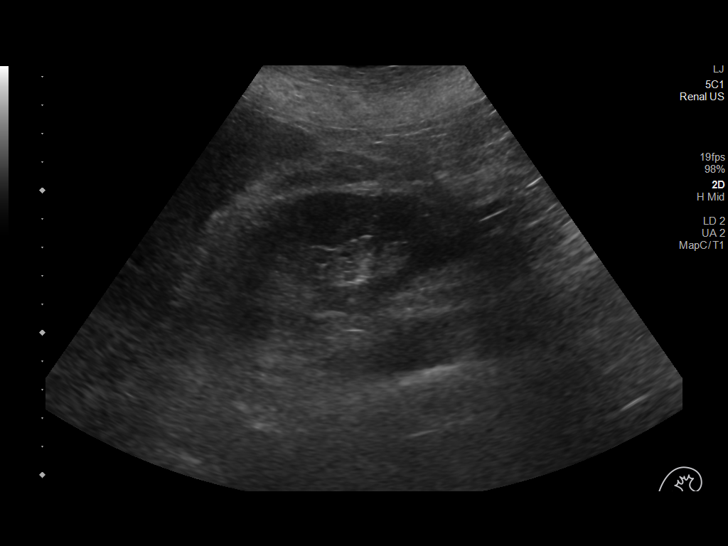
[im 14/54]
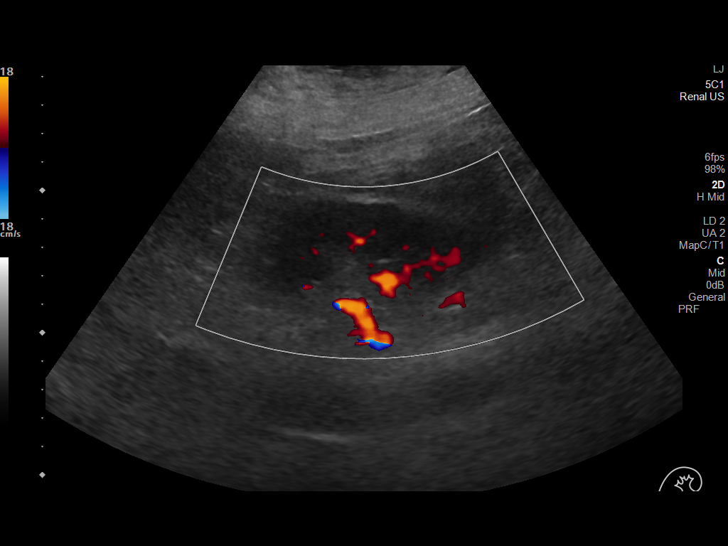
[im 18/54]
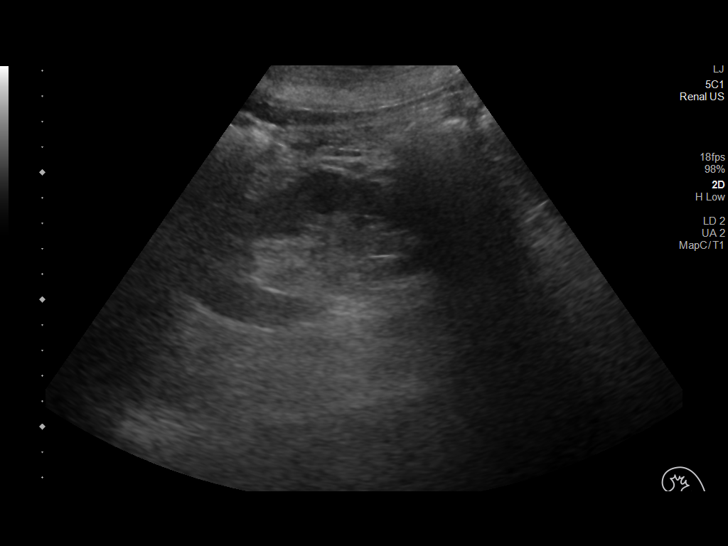
[im 20/54]
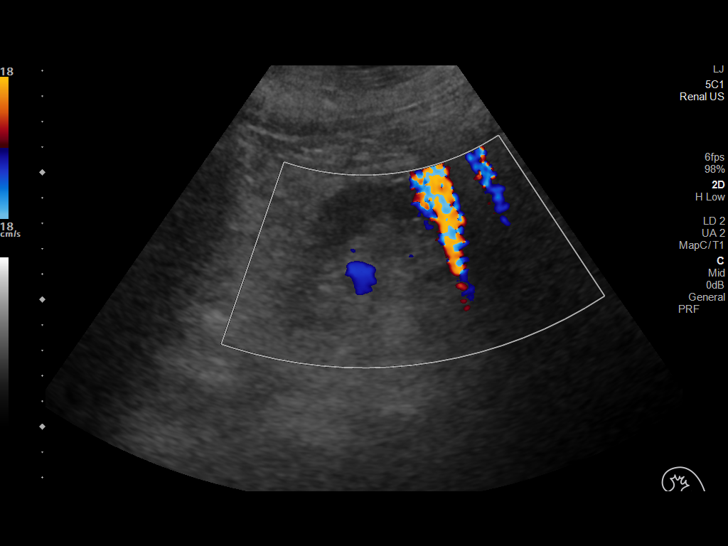
[im 25/54]
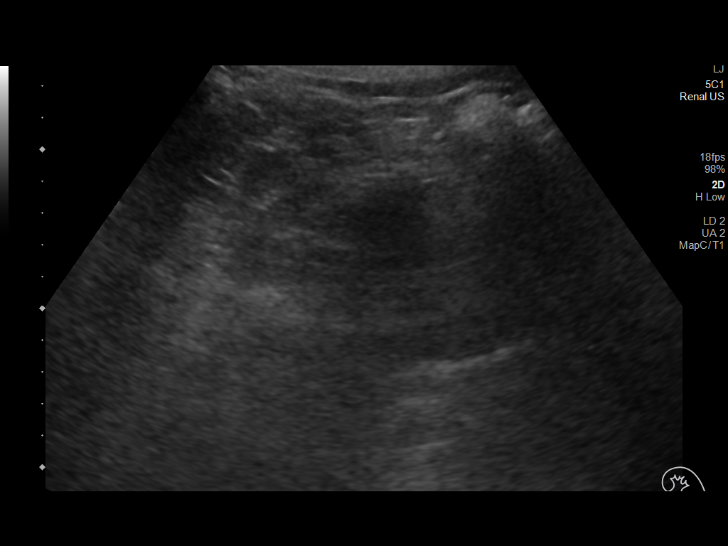
[im 29/54]
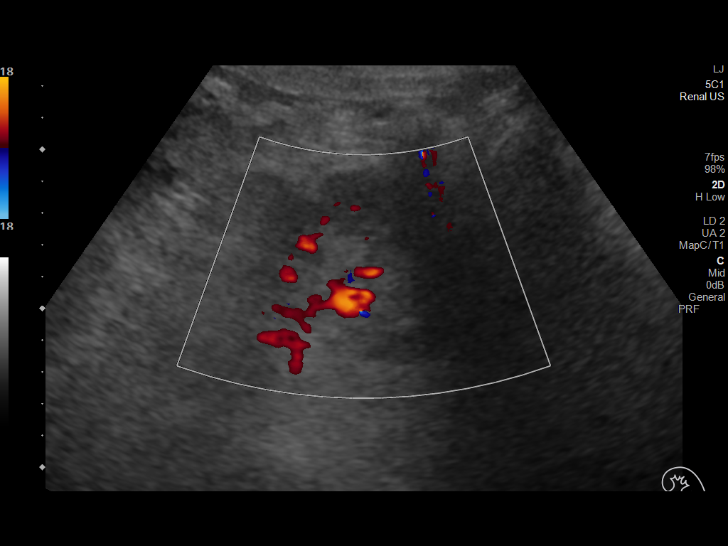
[im 34/54]
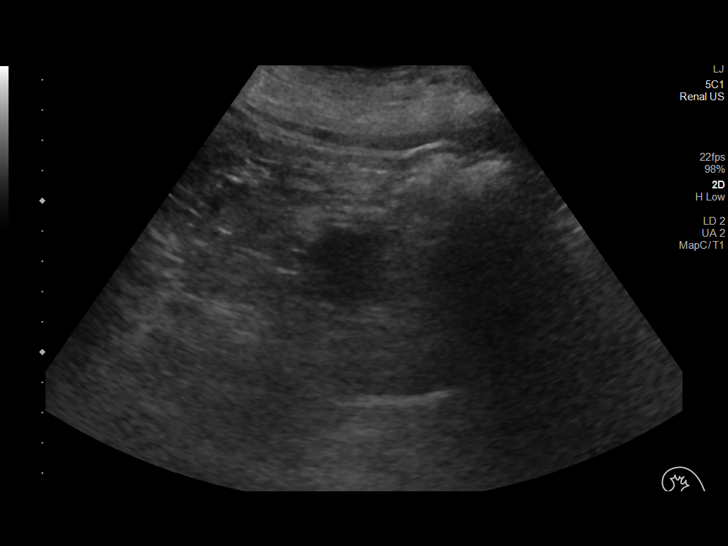
[im 36/54]
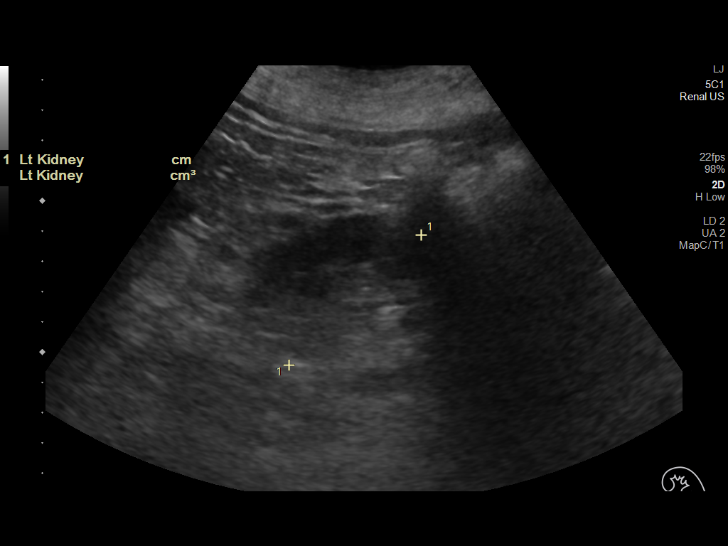
[im 40/54]
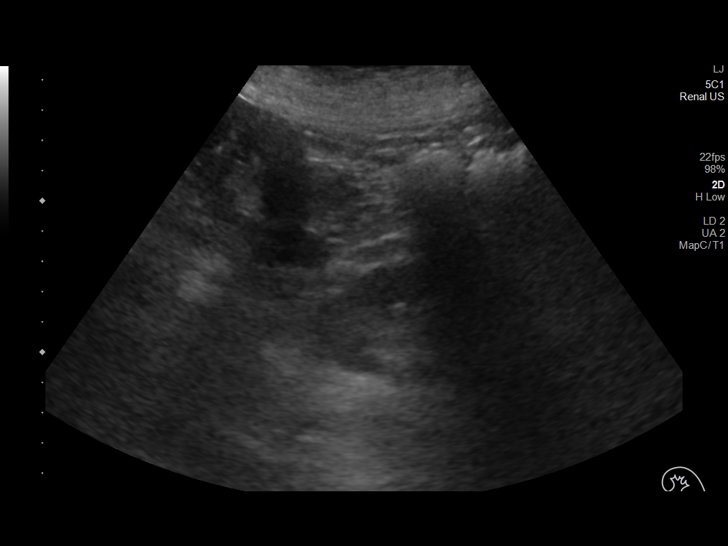
[im 45/54]
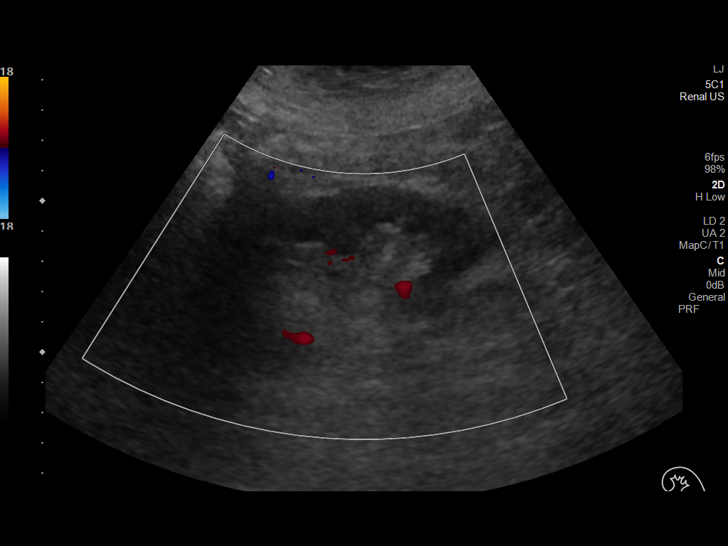
[im 49/54]
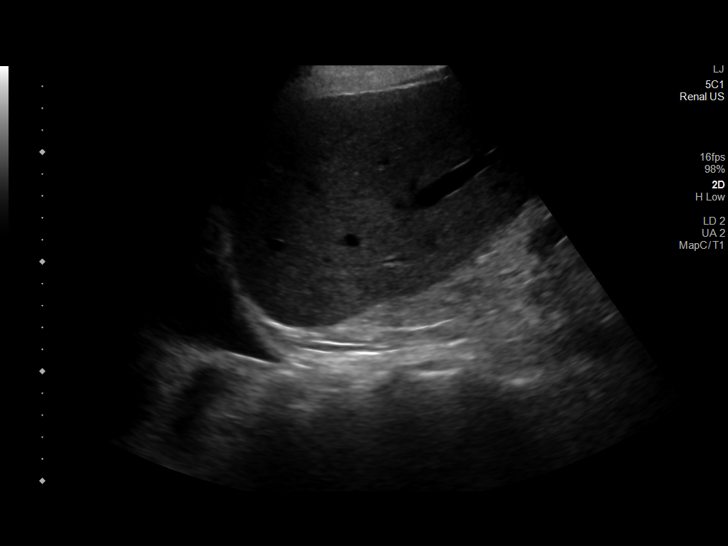
[im 54/54]
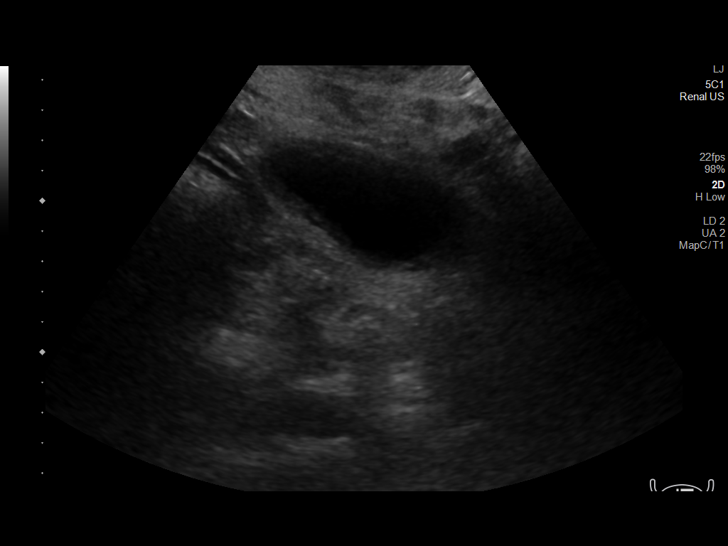

[14 of 25 positions shown; findings below may reference images not displayed]

FINDINGS: Right Kidney:

Renal measurements: 10.1 x 4.7 x 5.7 cm = volume: 139 mL.
Echogenicity within normal limits. No mass or hydronephrosis
visualized.

Left Kidney:

Renal measurements: 11.4 x 5.5 x 6.0 cm = volume: 198 mL. 2.2 cm
cyst in the midpole. Normal echotexture. No hydronephrosis.

Bladder:

Partially distended, grossly unremarkable.

Other:

None.
IMPRESSION: No acute findings.  No hydronephrosis.

## 2024-05-11 ENCOUNTER — Other Ambulatory Visit (HOSPITAL_COMMUNITY): Payer: Self-pay
